# Patient Record
Sex: Male | Born: 1940 | Race: Black or African American | Hispanic: No | Smoking: Never smoker
Health system: Southern US, Community
[De-identification: ages and names within clinical notes are randomized; demographics above are authoritative.]

## PROBLEM LIST (undated history)

## (undated) DIAGNOSIS — E119 Type 2 diabetes mellitus without complications: Secondary | ICD-10-CM

## (undated) DIAGNOSIS — E785 Hyperlipidemia, unspecified: Secondary | ICD-10-CM

## (undated) DIAGNOSIS — E039 Hypothyroidism, unspecified: Secondary | ICD-10-CM

## (undated) DIAGNOSIS — H269 Unspecified cataract: Secondary | ICD-10-CM

## (undated) DIAGNOSIS — K219 Gastro-esophageal reflux disease without esophagitis: Secondary | ICD-10-CM

## (undated) DIAGNOSIS — D126 Benign neoplasm of colon, unspecified: Secondary | ICD-10-CM

## (undated) DIAGNOSIS — E291 Testicular hypofunction: Secondary | ICD-10-CM

## (undated) DIAGNOSIS — N400 Enlarged prostate without lower urinary tract symptoms: Secondary | ICD-10-CM

## (undated) DIAGNOSIS — K602 Anal fissure, unspecified: Secondary | ICD-10-CM

## (undated) DIAGNOSIS — I639 Cerebral infarction, unspecified: Secondary | ICD-10-CM

## (undated) DIAGNOSIS — I10 Essential (primary) hypertension: Secondary | ICD-10-CM

## (undated) DIAGNOSIS — T7840XA Allergy, unspecified, initial encounter: Secondary | ICD-10-CM

## (undated) DIAGNOSIS — M1990 Unspecified osteoarthritis, unspecified site: Secondary | ICD-10-CM

## (undated) DIAGNOSIS — F419 Anxiety disorder, unspecified: Secondary | ICD-10-CM

## (undated) HISTORY — DX: Allergy, unspecified, initial encounter: T78.40XA

## (undated) HISTORY — DX: Gastro-esophageal reflux disease without esophagitis: K21.9

## (undated) HISTORY — DX: Unspecified osteoarthritis, unspecified site: M19.90

## (undated) HISTORY — DX: Type 2 diabetes mellitus without complications: E11.9

## (undated) HISTORY — DX: Cerebral infarction, unspecified: I63.9

## (undated) HISTORY — DX: Benign prostatic hyperplasia without lower urinary tract symptoms: N40.0

## (undated) HISTORY — DX: Hypothyroidism, unspecified: E03.9

## (undated) HISTORY — DX: Testicular hypofunction: E29.1

## (undated) HISTORY — DX: Essential (primary) hypertension: I10

## (undated) HISTORY — DX: Benign neoplasm of colon, unspecified: D12.6

## (undated) HISTORY — DX: Anal fissure, unspecified: K60.2

## (undated) HISTORY — PX: INGUINAL HERNIA REPAIR: SUR1180

## (undated) HISTORY — DX: Hyperlipidemia, unspecified: E78.5

## (undated) HISTORY — PX: COLONOSCOPY: SHX174

---
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
## 2000-08-01 ENCOUNTER — Ambulatory Visit (HOSPITAL_COMMUNITY): Admission: RE | Admit: 2000-08-01 | Discharge: 2000-08-01 | Payer: Self-pay | Admitting: Internal Medicine

## 2000-08-01 ENCOUNTER — Encounter: Payer: Self-pay | Admitting: Internal Medicine

## 2002-02-05 ENCOUNTER — Ambulatory Visit (HOSPITAL_COMMUNITY): Admission: RE | Admit: 2002-02-05 | Discharge: 2002-02-05 | Payer: Self-pay | Admitting: Internal Medicine

## 2002-02-12 ENCOUNTER — Inpatient Hospital Stay (HOSPITAL_COMMUNITY): Admission: AD | Admit: 2002-02-12 | Discharge: 2002-02-19 | Payer: Self-pay | Admitting: Specialist

## 2002-02-13 ENCOUNTER — Encounter: Payer: Self-pay | Admitting: Specialist

## 2002-06-18 ENCOUNTER — Ambulatory Visit (HOSPITAL_COMMUNITY): Admission: RE | Admit: 2002-06-18 | Discharge: 2002-06-18 | Payer: Self-pay | Admitting: Gastroenterology

## 2002-06-25 ENCOUNTER — Encounter: Admission: RE | Admit: 2002-06-25 | Discharge: 2002-06-25 | Payer: Self-pay | Admitting: Oncology

## 2002-06-25 ENCOUNTER — Encounter: Payer: Self-pay | Admitting: Oncology

## 2003-02-14 ENCOUNTER — Encounter (INDEPENDENT_AMBULATORY_CARE_PROVIDER_SITE_OTHER): Payer: Self-pay | Admitting: Specialist

## 2003-02-14 ENCOUNTER — Other Ambulatory Visit: Admission: RE | Admit: 2003-02-14 | Discharge: 2003-02-14 | Payer: Self-pay | Admitting: Oncology

## 2004-09-29 ENCOUNTER — Ambulatory Visit: Payer: Self-pay | Admitting: Oncology

## 2005-01-05 ENCOUNTER — Ambulatory Visit: Payer: Self-pay | Admitting: Oncology

## 2005-07-08 ENCOUNTER — Ambulatory Visit: Payer: Self-pay | Admitting: Oncology

## 2006-01-04 ENCOUNTER — Ambulatory Visit: Payer: Self-pay | Admitting: Oncology

## 2006-07-19 ENCOUNTER — Ambulatory Visit: Payer: Self-pay | Admitting: Oncology

## 2006-07-20 LAB — CBC WITH DIFFERENTIAL/PLATELET
Eosinophils Absolute: 0.1 10*3/uL (ref 0.0–0.5)
LYMPH%: 39.6 % (ref 14.0–48.0)
MCV: 89.1 fL (ref 81.6–98.0)
MONO%: 10.6 % (ref 0.0–13.0)
NEUT#: 2.9 10*3/uL (ref 1.5–6.5)
NEUT%: 47.7 % (ref 40.0–75.0)
Platelets: 186 10*3/uL (ref 145–400)
RBC: 4.44 10*6/uL (ref 4.20–5.71)

## 2006-07-25 LAB — SPEP & IFE WITH QIG
Alpha-1-Globulin: 4.6 % (ref 2.9–4.9)
Gamma Globulin: 8.3 % — ABNORMAL LOW (ref 11.1–18.8)
IgM, Serum: 13 mg/dL — ABNORMAL LOW (ref 60–263)

## 2006-07-25 LAB — COMPREHENSIVE METABOLIC PANEL
Alkaline Phosphatase: 55 U/L (ref 39–117)
BUN: 19 mg/dL (ref 6–23)
Creatinine, Ser: 1.25 mg/dL (ref 0.40–1.50)
Glucose, Bld: 105 mg/dL — ABNORMAL HIGH (ref 70–99)
Sodium: 143 mEq/L (ref 135–145)
Total Bilirubin: 0.7 mg/dL (ref 0.3–1.2)
Total Protein: 6.1 g/dL (ref 6.0–8.3)

## 2006-07-25 LAB — BETA 2 MICROGLOBULIN, SERUM: Beta-2 Microglobulin: 1.45 mg/L (ref 1.01–1.73)

## 2006-07-25 LAB — KAPPA/LAMBDA LIGHT CHAINS: Kappa free light chain: 0.61 mg/dL (ref 0.33–1.94)

## 2007-01-15 ENCOUNTER — Ambulatory Visit: Payer: Self-pay | Admitting: Oncology

## 2007-01-18 LAB — CBC WITH DIFFERENTIAL/PLATELET
Basophils Absolute: 0 10*3/uL (ref 0.0–0.1)
MCH: 29.8 pg (ref 28.0–33.4)
MCHC: 34.4 g/dL (ref 32.0–35.9)
MCV: 86.7 fL (ref 81.6–98.0)
NEUT%: 31.7 % — ABNORMAL LOW (ref 40.0–75.0)
Platelets: 153 10*3/uL (ref 145–400)
RBC: 4.64 10*6/uL (ref 4.20–5.71)
WBC: 3.2 10*3/uL — ABNORMAL LOW (ref 4.0–10.0)

## 2007-01-22 LAB — COMPREHENSIVE METABOLIC PANEL
Albumin: 3.7 g/dL (ref 3.5–5.2)
Alkaline Phosphatase: 56 U/L (ref 39–117)
BUN: 22 mg/dL (ref 6–23)
CO2: 28 mEq/L (ref 19–32)
Calcium: 8.5 mg/dL (ref 8.4–10.5)
Chloride: 100 mEq/L (ref 96–112)
Glucose, Bld: 243 mg/dL — ABNORMAL HIGH (ref 70–99)
Potassium: 4.4 mEq/L (ref 3.5–5.3)
Sodium: 137 mEq/L (ref 135–145)
Total Protein: 5.8 g/dL — ABNORMAL LOW (ref 6.0–8.3)

## 2007-01-22 LAB — SPEP & IFE WITH QIG
Albumin ELP: 58.7 % (ref 55.8–66.1)
Alpha-1-Globulin: 5.4 % — ABNORMAL HIGH (ref 2.9–4.9)
IgA: 320 mg/dL (ref 68–378)
IgM, Serum: 15 mg/dL — ABNORMAL LOW (ref 60–263)
Total Protein, Serum Electrophoresis: 5.8 g/dL — ABNORMAL LOW (ref 6.0–8.3)

## 2007-01-22 LAB — KAPPA/LAMBDA LIGHT CHAINS
Kappa free light chain: 1.61 mg/dL (ref 0.33–1.94)
Kappa:Lambda Ratio: 0.81 (ref 0.26–1.65)
Lambda Free Lght Chn: 2 mg/dL (ref 0.57–2.63)

## 2007-01-22 LAB — LACTATE DEHYDROGENASE: LDH: 134 U/L (ref 94–250)

## 2007-03-05 ENCOUNTER — Ambulatory Visit: Payer: Self-pay | Admitting: Oncology

## 2007-03-08 LAB — CBC WITH DIFFERENTIAL/PLATELET
BASO%: 0.6 % (ref 0.0–2.0)
Basophils Absolute: 0 10*3/uL (ref 0.0–0.1)
EOS%: 2.1 % (ref 0.0–7.0)
HGB: 12.8 g/dL — ABNORMAL LOW (ref 13.0–17.1)
MCH: 30.8 pg (ref 28.0–33.4)
MCHC: 35.5 g/dL (ref 32.0–35.9)
MCV: 86.7 fL (ref 81.6–98.0)
MONO%: 11.9 % (ref 0.0–13.0)
RBC: 4.17 10*6/uL — ABNORMAL LOW (ref 4.20–5.71)
RDW: 14.1 % (ref 11.2–14.6)
lymph#: 1.9 10*3/uL (ref 0.9–3.3)

## 2007-05-22 ENCOUNTER — Ambulatory Visit: Payer: Self-pay | Admitting: Gastroenterology

## 2007-06-05 ENCOUNTER — Encounter: Payer: Self-pay | Admitting: Gastroenterology

## 2007-06-05 ENCOUNTER — Ambulatory Visit: Payer: Self-pay | Admitting: Gastroenterology

## 2007-07-31 ENCOUNTER — Ambulatory Visit: Payer: Self-pay | Admitting: Oncology

## 2007-08-02 LAB — CBC WITH DIFFERENTIAL/PLATELET
EOS%: 3.2 % (ref 0.0–7.0)
Eosinophils Absolute: 0.2 10*3/uL (ref 0.0–0.5)
MCH: 30.7 pg (ref 28.0–33.4)
MCV: 87.3 fL (ref 81.6–98.0)
MONO%: 11 % (ref 0.0–13.0)
NEUT#: 2.5 10*3/uL (ref 1.5–6.5)
RBC: 4.28 10*6/uL (ref 4.20–5.71)
RDW: 14.3 % (ref 11.2–14.6)
lymph#: 2 10*3/uL (ref 0.9–3.3)

## 2007-08-06 LAB — SPEP & IFE WITH QIG
IgA: 298 mg/dL (ref 68–378)
IgG (Immunoglobin G), Serum: 439 mg/dL — ABNORMAL LOW (ref 694–1618)
IgM, Serum: 15 mg/dL — ABNORMAL LOW (ref 60–263)
Total Protein, Serum Electrophoresis: 5.8 g/dL — ABNORMAL LOW (ref 6.0–8.3)

## 2007-08-06 LAB — COMPREHENSIVE METABOLIC PANEL
CO2: 24 mEq/L (ref 19–32)
Creatinine, Ser: 1.24 mg/dL (ref 0.40–1.50)
Glucose, Bld: 146 mg/dL — ABNORMAL HIGH (ref 70–99)
Total Bilirubin: 0.6 mg/dL (ref 0.3–1.2)

## 2007-08-06 LAB — BETA 2 MICROGLOBULIN, SERUM: Beta-2 Microglobulin: 2 mg/L — ABNORMAL HIGH (ref 1.01–1.73)

## 2008-03-19 ENCOUNTER — Ambulatory Visit: Payer: Self-pay | Admitting: Oncology

## 2008-03-21 LAB — CBC & DIFF AND RETIC
Basophils Absolute: 0.1 10*3/uL (ref 0.0–0.1)
EOS%: 2.4 % (ref 0.0–7.0)
Eosinophils Absolute: 0.1 10*3/uL (ref 0.0–0.5)
HGB: 13.6 g/dL (ref 13.0–17.1)
LYMPH%: 40.8 % (ref 14.0–48.0)
MCH: 30.1 pg (ref 28.0–33.4)
MCV: 86.8 fL (ref 81.6–98.0)
MONO%: 9.3 % (ref 0.0–13.0)
NEUT#: 2.1 10*3/uL (ref 1.5–6.5)
NEUT%: 46 % (ref 40.0–75.0)
Platelets: 184 10*3/uL (ref 145–400)

## 2008-03-26 LAB — SPEP & IFE WITH QIG
Alpha-2-Globulin: 9.6 % (ref 7.1–11.8)
Gamma Globulin: 8.3 % — ABNORMAL LOW (ref 11.1–18.8)
IgG (Immunoglobin G), Serum: 565 mg/dL — ABNORMAL LOW (ref 694–1618)
IgM, Serum: 15 mg/dL — ABNORMAL LOW (ref 60–263)

## 2008-03-26 LAB — COMPREHENSIVE METABOLIC PANEL
AST: 19 U/L (ref 0–37)
Albumin: 3.9 g/dL (ref 3.5–5.2)
BUN: 19 mg/dL (ref 6–23)
CO2: 25 mEq/L (ref 19–32)
Calcium: 9.1 mg/dL (ref 8.4–10.5)
Chloride: 104 mEq/L (ref 96–112)
Glucose, Bld: 124 mg/dL — ABNORMAL HIGH (ref 70–99)
Potassium: 4.1 mEq/L (ref 3.5–5.3)

## 2008-03-26 LAB — KAPPA/LAMBDA LIGHT CHAINS: Kappa free light chain: 0.55 mg/dL (ref 0.33–1.94)

## 2008-03-26 LAB — LACTATE DEHYDROGENASE: LDH: 135 U/L (ref 94–250)

## 2008-10-01 ENCOUNTER — Ambulatory Visit: Payer: Self-pay | Admitting: Oncology

## 2008-10-03 LAB — CBC & DIFF AND RETIC
Basophils Absolute: 0 10*3/uL (ref 0.0–0.1)
EOS%: 2 % (ref 0.0–7.0)
Eosinophils Absolute: 0.1 10*3/uL (ref 0.0–0.5)
HCT: 38 % — ABNORMAL LOW (ref 38.7–49.9)
HGB: 12.9 g/dL — ABNORMAL LOW (ref 13.0–17.1)
MCH: 30 pg (ref 28.0–33.4)
MCV: 88.4 fL (ref 81.6–98.0)
MONO%: 11.8 % (ref 0.0–13.0)
NEUT#: 2.5 10*3/uL (ref 1.5–6.5)
NEUT%: 49.3 % (ref 40.0–75.0)
Platelets: 183 10*3/uL (ref 145–400)
RETIC #: 64.9 10*3/uL (ref 31.8–103.9)

## 2008-10-07 LAB — SPEP & IFE WITH QIG
Albumin ELP: 62.5 % (ref 55.8–66.1)
Alpha-2-Globulin: 9.9 % (ref 7.1–11.8)
Beta 2: 7.1 % — ABNORMAL HIGH (ref 3.2–6.5)
Beta Globulin: 7.5 % — ABNORMAL HIGH (ref 4.7–7.2)
IgA: 347 mg/dL (ref 68–378)
IgG (Immunoglobin G), Serum: 522 mg/dL — ABNORMAL LOW (ref 694–1618)
Total Protein, Serum Electrophoresis: 5.9 g/dL — ABNORMAL LOW (ref 6.0–8.3)

## 2008-10-07 LAB — COMPREHENSIVE METABOLIC PANEL
ALT: 18 U/L (ref 0–53)
AST: 18 U/L (ref 0–37)
Albumin: 4.1 g/dL (ref 3.5–5.2)
BUN: 19 mg/dL (ref 6–23)
CO2: 22 mEq/L (ref 19–32)
Calcium: 9.4 mg/dL (ref 8.4–10.5)
Chloride: 104 mEq/L (ref 96–112)
Creatinine, Ser: 1.29 mg/dL (ref 0.40–1.50)
Potassium: 4 mEq/L (ref 3.5–5.3)

## 2008-10-07 LAB — LACTATE DEHYDROGENASE: LDH: 135 U/L (ref 94–250)

## 2009-03-23 ENCOUNTER — Ambulatory Visit: Payer: Self-pay | Admitting: Oncology

## 2009-03-25 LAB — CBC WITH DIFFERENTIAL/PLATELET
Basophils Absolute: 0 10*3/uL (ref 0.0–0.1)
EOS%: 1.8 % (ref 0.0–7.0)
Eosinophils Absolute: 0.1 10*3/uL (ref 0.0–0.5)
HGB: 12.7 g/dL — ABNORMAL LOW (ref 13.0–17.1)
MCH: 29.6 pg (ref 27.2–33.4)
NEUT#: 2.2 10*3/uL (ref 1.5–6.5)
RBC: 4.29 10*6/uL (ref 4.20–5.82)
RDW: 13.2 % (ref 11.0–14.6)
WBC: 5 10*3/uL (ref 4.0–10.3)
lymph#: 2.2 10*3/uL (ref 0.9–3.3)

## 2009-03-27 LAB — IMMUNOFIXATION ELECTROPHORESIS: IgG (Immunoglobin G), Serum: 531 mg/dL — ABNORMAL LOW (ref 694–1618)

## 2009-03-27 LAB — COMPREHENSIVE METABOLIC PANEL
AST: 19 U/L (ref 0–37)
Albumin: 3.9 g/dL (ref 3.5–5.2)
BUN: 21 mg/dL (ref 6–23)
Calcium: 8.9 mg/dL (ref 8.4–10.5)
Chloride: 104 mEq/L (ref 96–112)
Potassium: 4.2 mEq/L (ref 3.5–5.3)
Sodium: 139 mEq/L (ref 135–145)
Total Protein: 5.9 g/dL — ABNORMAL LOW (ref 6.0–8.3)

## 2009-03-27 LAB — KAPPA/LAMBDA LIGHT CHAINS
Kappa free light chain: <0.6 mg/dL (ref 0.33–1.94)
Lambda Free Lght Chn: 1.1 mg/dL (ref 0.57–2.63)

## 2009-03-27 LAB — IRON AND TIBC
%SAT: 25 % (ref 20–55)
Iron: 79 ug/dL (ref 42–165)

## 2009-03-27 LAB — BETA 2 MICROGLOBULIN, SERUM: Beta-2 Microglobulin: 1.9 mg/L — ABNORMAL HIGH (ref 1.01–1.73)

## 2009-10-23 ENCOUNTER — Ambulatory Visit: Payer: Self-pay | Admitting: Oncology

## 2009-10-27 LAB — CBC & DIFF AND RETIC
BASO%: 0.2 % (ref 0.0–2.0)
Eosinophils Absolute: 0.1 10*3/uL (ref 0.0–0.5)
LYMPH%: 48 % (ref 14.0–49.0)
MCHC: 34.4 g/dL (ref 32.0–36.0)
MCV: 86.2 fL (ref 79.3–98.0)
MONO#: 0.4 10*3/uL (ref 0.1–0.9)
MONO%: 9.2 % (ref 0.0–14.0)
NEUT#: 1.9 10*3/uL (ref 1.5–6.5)
Platelets: 157 10*3/uL (ref 140–400)
RBC: 4.42 10*6/uL (ref 4.20–5.82)
RDW: 13.2 % (ref 11.0–14.6)
Retic %: 1.21 % (ref 0.50–1.60)
Retic Ct Abs: 53.48 10*3/uL (ref 24.10–77.50)
WBC: 4.7 10*3/uL (ref 4.0–10.3)

## 2009-10-29 LAB — SPEP & IFE WITH QIG
Alpha-1-Globulin: 4.3 % (ref 2.9–4.9)
Alpha-2-Globulin: 9.7 % (ref 7.1–11.8)
Beta 2: 7.5 % — ABNORMAL HIGH (ref 3.2–6.5)
Beta Globulin: 7.6 % — ABNORMAL HIGH (ref 4.7–7.2)
Gamma Globulin: 8.8 % — ABNORMAL LOW (ref 11.1–18.8)
IgG (Immunoglobin G), Serum: 487 mg/dL — ABNORMAL LOW (ref 694–1618)

## 2009-10-29 LAB — COMPREHENSIVE METABOLIC PANEL
AST: 19 U/L (ref 0–37)
Alkaline Phosphatase: 60 U/L (ref 39–117)
BUN: 17 mg/dL (ref 6–23)
Glucose, Bld: 178 mg/dL — ABNORMAL HIGH (ref 70–99)
Total Bilirubin: 0.3 mg/dL (ref 0.3–1.2)

## 2009-10-29 LAB — IRON AND TIBC
%SAT: 24 % (ref 20–55)
Iron: 73 ug/dL (ref 42–165)
TIBC: 307 ug/dL (ref 215–435)
UIBC: 234 ug/dL

## 2009-10-29 LAB — FERRITIN: Ferritin: 44 ng/mL (ref 22–322)

## 2009-12-31 ENCOUNTER — Encounter: Admission: RE | Admit: 2009-12-31 | Discharge: 2009-12-31 | Payer: Self-pay | Admitting: General Surgery

## 2010-01-04 ENCOUNTER — Ambulatory Visit (HOSPITAL_BASED_OUTPATIENT_CLINIC_OR_DEPARTMENT_OTHER): Admission: RE | Admit: 2010-01-04 | Discharge: 2010-01-04 | Payer: Self-pay | Admitting: General Surgery

## 2010-10-27 ENCOUNTER — Ambulatory Visit: Payer: Self-pay | Admitting: Oncology

## 2010-10-28 LAB — CBC WITH DIFFERENTIAL/PLATELET
BASO%: 0.5 % (ref 0.0–2.0)
Basophils Absolute: 0 10*3/uL (ref 0.0–0.1)
EOS%: 1.6 % (ref 0.0–7.0)
Eosinophils Absolute: 0.1 10*3/uL (ref 0.0–0.5)
HCT: 38.9 % (ref 38.4–49.9)
HGB: 13.4 g/dL (ref 13.0–17.1)
LYMPH%: 40.2 % (ref 14.0–49.0)
MCH: 30.5 pg (ref 27.2–33.4)
MCHC: 34.5 g/dL (ref 32.0–36.0)
MCV: 88.3 fL (ref 79.3–98.0)
MONO#: 0.5 10*3/uL (ref 0.1–0.9)
MONO%: 11.4 % (ref 0.0–14.0)
NEUT#: 2.2 10*3/uL (ref 1.5–6.5)
NEUT%: 46.3 % (ref 39.0–75.0)
Platelets: 185 10*3/uL (ref 140–400)
RBC: 4.4 10*6/uL (ref 4.20–5.82)
RDW: 13.2 % (ref 11.0–14.6)
WBC: 4.8 10*3/uL (ref 4.0–10.3)
lymph#: 1.9 10*3/uL (ref 0.9–3.3)

## 2010-11-01 LAB — SPEP & IFE WITH QIG
Alpha-2-Globulin: 10.9 % (ref 7.1–11.8)
Gamma Globulin: 9 % — ABNORMAL LOW (ref 11.1–18.8)
IgG (Immunoglobin G), Serum: 614 mg/dL — ABNORMAL LOW (ref 694–1618)

## 2010-11-01 LAB — IRON AND TIBC
%SAT: 39 % (ref 20–55)
Iron: 121 ug/dL (ref 42–165)

## 2010-11-01 LAB — COMPREHENSIVE METABOLIC PANEL
ALT: 12 U/L (ref 0–53)
AST: 17 U/L (ref 0–37)
Albumin: 4 g/dL (ref 3.5–5.2)
Alkaline Phosphatase: 69 U/L (ref 39–117)
BUN: 18 mg/dL (ref 6–23)
CO2: 28 mEq/L (ref 19–32)
Calcium: 9.4 mg/dL (ref 8.4–10.5)
Chloride: 102 mEq/L (ref 96–112)
Creatinine, Ser: 1.13 mg/dL (ref 0.40–1.50)
Glucose, Bld: 138 mg/dL — ABNORMAL HIGH (ref 70–99)
Potassium: 4.2 mEq/L (ref 3.5–5.3)
Sodium: 140 mEq/L (ref 135–145)
Total Bilirubin: 0.5 mg/dL (ref 0.3–1.2)
Total Protein: 6 g/dL (ref 6.0–8.3)

## 2010-11-01 LAB — KAPPA/LAMBDA LIGHT CHAINS
Kappa free light chain: <0.66 mg/dL (ref 0.33–1.94)
Lambda Free Lght Chn: 0.83 mg/dL (ref 0.57–2.63)

## 2010-11-01 LAB — LACTATE DEHYDROGENASE: LDH: 109 U/L (ref 94–250)

## 2011-02-09 LAB — CBC
MCHC: 33.9 g/dL (ref 30.0–36.0)
Platelets: 180 10*3/uL (ref 150–400)
RBC: 4.38 MIL/uL (ref 4.22–5.81)
RDW: 13.2 % (ref 11.5–15.5)

## 2011-02-09 LAB — DIFFERENTIAL
Basophils Absolute: 0 10*3/uL (ref 0.0–0.1)
Basophils Relative: 1 % (ref 0–1)
Neutro Abs: 2.5 10*3/uL (ref 1.7–7.7)
Neutrophils Relative %: 45 % (ref 43–77)

## 2011-02-09 LAB — BASIC METABOLIC PANEL
CO2: 28 mEq/L (ref 19–32)
Calcium: 9.4 mg/dL (ref 8.4–10.5)
Creatinine, Ser: 1.36 mg/dL (ref 0.4–1.5)
GFR calc Af Amer: >60 mL/min (ref >60)
Glucose, Bld: 108 mg/dL — ABNORMAL HIGH (ref 70–99)

## 2011-02-09 LAB — GLUCOSE, CAPILLARY
Glucose-Capillary: 161 mg/dL — ABNORMAL HIGH (ref 70–99)
Glucose-Capillary: 181 mg/dL — ABNORMAL HIGH (ref 70–99)

## 2011-04-08 NOTE — H&P (Signed)
[facility]  Patient:    Earl [name], Earl [name] Visit Number: [id number] MRN: [id number]          Service Type: Attending:  Philips J. Montez Morita, M.D. Dictated by:   Sammuel Cooper. Mahar, P.A. Adm. Date:  02/12/02                           History and Physical  CHIEF COMPLAINT:  Left knee pain and swelling.  HISTORY OF PRESENT ILLNESS:  For approximately three weeks the patient has been having left knee pain and swelling.  He reports that he had a sinus infection at the same time as the knee swelling and pain began.  It had been getting progressively worse over three weeks.  He has been seen by his primary care physician for this and treated with antibiotics.  Over the last two to three days it has been getting slightly better.  The pain has decreased, as well as the swelling.  ALLERGIES:  CIPRO causes a rash.  MEDICATIONS:  Toprol, hydrochlorothiazide, Accupril, and glyburide.  Doses and frequency are unknown.  The patient will bring these with him to the hospital for clarification.  PAST MEDICAL HISTORY: 1. Diabetes mellitus type 2. 2. Hypertension.  PAST SURGICAL HISTORY:  Unknown.  SOCIAL HISTORY:  The patient denies tobacco use.  Denies alcohol use.  He is married, lives with his wife.  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  FAMILY HISTORY:  Significant for diabetes mellitus and sickle cell anemia.  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  REVIEW OF SYSTEMS:  The patient denies any fevers, chills, night sweats, or bleeding tendencies.  CNS:  Denies any blurred vision, double vision, headaches, seizure, or paralysis.  CARDIOVASCULAR:  Denies any chest pain, angina, orthopnea, claudication, or palpitations.  PULMONARY:  Denies any shortness of breath, productive cough, or hemoptysis.  GASTROINTESTINAL: Denies any nausea, vomiting, constipation, diarrhea, melena, or bloody stool. GENITOURINARY:  Denies any dysuria, hematuria, or  discharge.  PHYSICAL EXAMINATION:  GENERAL:  The patient is a 70 year old male.  He is alert and oriented.  In no acute distress.  Well-nourished, well-groomed.  Appears stated age.  HEENT:  Head normocephalic, atraumatic.  Extraocular movements intact.  Nares patent bilaterally.  Pharynx is clear.  NECK:  No bruits.  No lymphadenopathy.  No thyromegaly noted.  CHEST:  Clear to auscultation bilaterally.  BREASTS:  Not pertinent, not performed.  HEART:  Regular rate and rhythm.  No murmurs, gallops, or rubs.  ABDOMEN:  Soft and supple.  Nontender, nondistended.  Positive bowel sounds throughout.  No organomegaly noted.  GENITOURINARY:  Not pertinent, not performed.  EXTREMITIES:  Left knee and left ankle with swelling.  No discolorations present.  Painful knee range of motion.  Sensation and motor function intact distally.  Dorsalis pedis pulses and posterior tibialis pulses are intact although difficult to palpate secondary to swelling about the ankle and foot.  LABORATORY DATA:  X-ray with no acute changes.  IMPRESSION:  Question septic knee on the left side.  PLAN:  Admit to [facility] for IV antibiotics and observation to Dr. Ronnell Guadalajara. Dictated by:   Sammuel Cooper. Mahar, P.A. Attending:  Philips J. Montez Morita, M.D. DD:  02/13/02 TD:  02/14/02 Job: [id number]

## 2011-04-08 NOTE — Op Note (Signed)
[facility]  Patient:    Earl [name], Earl [name] Visit Number: [id number] MRN: [id number]          Service Type: SUR Location: 4W 0480 01 Attending Physician:  Rocky Crafts Dictated by:   Michael Litter. Montez Morita, M.D. Proc. Date: 02/14/02 Admit Date:  02/12/2002                             Operative Report  PREOPERATIVE DIAGNOSIS:  Partially treated septic knee, left knee.  POSTOPERATIVE DIAGNOSIS:  Partially treated septic knee, left knee.  OPERATION PERFORMED:  Debridement and irrigation of the left knee with partial synovial removal.  DESCRIPTION OF PROCEDURE:  After suitable general anesthesia, the leg was exsanguinated and upper thigh tourniquet inflated to 350 mmHg. He was then placed in a leg holder and prepped and draped routinely. A large cannula was placed through the lateral parapatellar portal and expressed a goodly amount of serous fluid with large clumps of material in it. This was sent for Gram stain culture and sensitivity, aerobic, anaerobic and fungal cultures. A previous culture done from my office two days ago on aspirate in the office showed a few white cells on the Gram stain and was not growing anything at this point. He had been partially treated with antibiotics over the last month, pain had increased dramatically in the last two weeks. He had been on prednisone as well. He is a diabetic.  Following this, the arthroscope was introduced through an anterolateral portal. The fluid was then brought in through the arthroscope, a lateral parapatellar cannula was removed and a 4.2 Gator rotary meniscotome nonaggressive was brought in to remove the clumps and debris from the suprapatellar pouch and some of the thickened pockets of synovium. The scope was then brought into the medial joint where again a lot of that same material and a medial puncture wound brought in the scope and this was cleaned out then. Severe degenerative  changes over the medial femoral condyle and over the medial plateau were noted. I came over to the lateral joint after removing part of the ligament of mucosum. There was some thick angry material around the ACL. ______ joint did not have the same degree of destruction that I think we saw on the medial side and the patella. The back of the knee was pumped for irrigation to bring the fluid up and washed out as we went along. As fluid was brought in through the scope after the debridement was carried out, outflow was then just done manually through the large outflow cannula and the lateral parapatellar area that was reinserted. At the end of the procedure, the lateral parapatellar and the anteromedial portals were sutured. 20 cc of 0.5% plain Marcaine were put into the knee through the scope. The scope was removed and the last portal was sutured. A compression dressing was applied. He had been on prednisone up to this time, so a 100 mg of Solu-Cortef was given by anesthesia IV. He had been afebrile the last 24 hours on IV vancomycin. He went to recovery in good condition. Dictated by:   [employer] Montez Morita, M.D. Attending Physician:  Rocky Crafts DD:  02/14/02 TD:  02/15/02 Job: [id number]

## 2011-04-08 NOTE — Discharge Summary (Signed)
[facility]  Patient:    Earl [name], Earl [name] Visit Number: [id number] MRN: [id number]          Service Type: SUR Attending Physician:  Rocky Crafts Dictated by:   Judeth Porch. Perkins, P.A.-C. Admit Date:  02/12/2002 Discharge Date: 02/19/2002   CC:         Dewayne Shorter, M.D.   Discharge Summary  NO DICTATION Dictated by:   Alexzandrew L. Perkins, P.A.-C. Attending Physician:  Rocky Crafts DD:  03/14/02 TD:  03/15/02 Job: [phone]

## 2011-10-28 ENCOUNTER — Other Ambulatory Visit: Payer: Self-pay | Admitting: Oncology

## 2011-10-28 ENCOUNTER — Other Ambulatory Visit (HOSPITAL_BASED_OUTPATIENT_CLINIC_OR_DEPARTMENT_OTHER): Payer: Medicare Other | Admitting: Lab

## 2011-10-28 DIAGNOSIS — D472 Monoclonal gammopathy: Secondary | ICD-10-CM

## 2011-10-28 LAB — CBC WITH DIFFERENTIAL/PLATELET
BASO%: 0.6 % (ref 0.0–2.0)
LYMPH%: 41.7 % (ref 14.0–49.0)
MCHC: 33.6 g/dL (ref 32.0–36.0)
MONO#: 0.5 10*3/uL (ref 0.1–0.9)
Platelets: 173 10*3/uL (ref 140–400)
RBC: 4.45 10*6/uL (ref 4.20–5.82)
RDW: 13.8 % (ref 11.0–14.6)
WBC: 4.7 10*3/uL (ref 4.0–10.3)
lymph#: 2 10*3/uL (ref 0.9–3.3)

## 2011-11-01 LAB — COMPREHENSIVE METABOLIC PANEL
ALT: 15 U/L (ref 0–53)
AST: 16 U/L (ref 0–37)
Albumin: 3.9 g/dL (ref 3.5–5.2)
Alkaline Phosphatase: 60 U/L (ref 39–117)
BUN: 17 mg/dL (ref 6–23)
CO2: 29 mEq/L (ref 19–32)
Calcium: 9.4 mg/dL (ref 8.4–10.5)
Chloride: 103 mEq/L (ref 96–112)
Creatinine, Ser: 1.2 mg/dL (ref 0.50–1.35)
Glucose, Bld: 149 mg/dL — ABNORMAL HIGH (ref 70–99)
Potassium: 4.3 mEq/L (ref 3.5–5.3)
Sodium: 140 mEq/L (ref 135–145)
Total Bilirubin: 0.6 mg/dL (ref 0.3–1.2)
Total Protein: 5.7 g/dL — ABNORMAL LOW (ref 6.0–8.3)

## 2011-11-01 LAB — IMMUNOFIXATION ELECTROPHORESIS
IgA: 307 mg/dL (ref 68–379)
IgG (Immunoglobin G), Serum: 546 mg/dL — ABNORMAL LOW (ref 650–1600)
IgM, Serum: 17 mg/dL — ABNORMAL LOW (ref 41–251)
Total Protein, Serum Electrophoresis: 5.7 g/dL — ABNORMAL LOW (ref 6.0–8.3)

## 2011-11-01 LAB — KAPPA/LAMBDA LIGHT CHAINS: Kappa:Lambda Ratio: 0.71 (ref 0.26–1.65)

## 2011-11-01 LAB — IRON AND TIBC
%SAT: 31 % (ref 20–55)
TIBC: 308 ug/dL (ref 215–435)

## 2011-11-01 LAB — BETA 2 MICROGLOBULIN, SERUM: Beta-2 Microglobulin: 1.9 mg/L — ABNORMAL HIGH (ref 1.01–1.73)

## 2011-11-01 LAB — FERRITIN: Ferritin: 44 ng/mL (ref 22–322)

## 2011-11-03 ENCOUNTER — Other Ambulatory Visit: Payer: Self-pay | Admitting: Oncology

## 2011-11-03 ENCOUNTER — Telehealth: Payer: Self-pay | Admitting: Oncology

## 2011-11-03 ENCOUNTER — Ambulatory Visit (HOSPITAL_BASED_OUTPATIENT_CLINIC_OR_DEPARTMENT_OTHER): Payer: Medicare Other | Admitting: Oncology

## 2011-11-03 VITALS — BP 146/80 | HR 65 | Temp 97.6°F | Wt 201.3 lb

## 2011-11-03 DIAGNOSIS — D472 Monoclonal gammopathy: Secondary | ICD-10-CM

## 2011-11-03 NOTE — Telephone Encounter (Signed)
Gv pt appt for dec2013 °

## 2011-11-03 NOTE — Progress Notes (Signed)
Hematology and Oncology Follow Up Visit  Earl [name] [id number] 1941-09-17 70 y.o. 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  Medications: I have reviewed the patient's current medications.  Allergies:  Allergies  Allergen Reactions  . Penicillins     Past Medical History, Surgical history, Social history, and Family History were reviewed and updated.  Review of Systems: Constitutional:  Negative for fever, chills, night sweats, anorexia, weight loss, pain. Cardiovascular: no chest pain or dyspnea on exertion Respiratory: no cough, shortness of breath, or wheezing Neurological: no TIA or stroke symptoms Dermatological: negative ENT: negative Skin Gastrointestinal: no abdominal pain, change in bowel habits, or black or bloody stools Genito-Urinary: no dysuria, trouble voiding, or hematuria Hematological and Lymphatic: negative Breast: negative for breast lumps negative Musculoskeletal: negative Remaining ROS negative.  Physical Exam: Blood pressure 146/80, pulse 65, temperature 97.6 F (36.4 C), weight 201 lb 4.8 oz (91.309 kg). ECOG: 0 General appearance: alert, cooperative and appears stated age Head: Normocephalic, without obvious abnormality, atraumatic Neck: no adenopathy, no carotid bruit, no JVD, supple, symmetrical, trachea midline and thyroid not enlarged, symmetric, no tenderness/mass/nodules Lymph nodes: Cervical, supraclavicular, and axillary nodes normal. Cardiac : nl Pulmonary:nl Breasts:n/a Abdomen:nl Extremitiesnl Neuro:nl  Lab Results: Lab Results  Component Value Date   WBC 4.7 10/28/2011   HGB 13.3 10/28/2011   HCT 39.4 10/28/2011   MCV 88.6 10/28/2011   PLT 173 10/28/2011     Chemistry      Component Value Date/Time   NA 140 10/28/2011 0934   NA 140 10/28/2011 0934   NA 140 10/28/2011 0934   NA 140 10/28/2011 0934     NA 140 10/28/2011 0934   NA 140 10/28/2011 0934   K 4.3 10/28/2011 0934   K 4.3 10/28/2011 0934   K 4.3 10/28/2011 0934   K 4.3 10/28/2011 0934   K 4.3 10/28/2011 0934   K 4.3 10/28/2011 0934   CL 103 10/28/2011 0934   CL 103 10/28/2011 0934   CL 103 10/28/2011 0934   CL 103 10/28/2011 0934   CL 103 10/28/2011 0934   CL 103 10/28/2011 0934   CO2 29 10/28/2011 0934   CO2 29 10/28/2011 0934   CO2 29 10/28/2011 0934   CO2 29 10/28/2011 0934   CO2 29 10/28/2011 0934   CO2 29 10/28/2011 0934   BUN 17 10/28/2011 0934   BUN 17 10/28/2011 0934   BUN 17 10/28/2011 0934   BUN 17 10/28/2011 0934   BUN 17 10/28/2011 0934   BUN 17 10/28/2011 0934   CREATININE 1.20 10/28/2011 0934   CREATININE 1.20 10/28/2011 0934   CREATININE 1.20 10/28/2011 0934   CREATININE 1.20 10/28/2011 0934   CREATININE 1.20 10/28/2011 0934   CREATININE 1.20 10/28/2011 0934      Component Value Date/Time   CALCIUM 9.4 10/28/2011 0934   CALCIUM 9.4 10/28/2011 0934   CALCIUM 9.4 10/28/2011 0934   CALCIUM 9.4 10/28/2011 0934   CALCIUM 9.4 10/28/2011 0934   CALCIUM 9.4 10/28/2011 0934   ALKPHOS 60 10/28/2011 0934   ALKPHOS 60 10/28/2011 0934   ALKPHOS 60 10/28/2011 0934   ALKPHOS 60 10/28/2011 0934   ALKPHOS 60 10/28/2011 0934   ALKPHOS 60 10/28/2011 0934   AST 16 10/28/2011 0934   AST 16 10/28/2011 0934   AST 16 10/28/2011 0934   AST 16 10/28/2011 0934   AST 16  10/28/2011 0934   AST 16 10/28/2011 0934   ALT 15 10/28/2011 0934   ALT 15 10/28/2011 0934   ALT 15 10/28/2011 0934   ALT 15 10/28/2011 0934   ALT 15 10/28/2011 0934   ALT 15 10/28/2011 0934   BILITOT 0.6 10/28/2011 0934   BILITOT 0.6 10/28/2011 0934   BILITOT 0.6 10/28/2011 0934   BILITOT 0.6 10/28/2011 0934   BILITOT 0.6 10/28/2011 0934   BILITOT 0.6 10/28/2011 0934       Radiological Studies: chest X-ray n/a Mammogram n/a Bone density n/a  Impression and Plan: Lab work shows stable m-spike, and SFLC. No changes in f/u; i yr.  More than 50% of the visit was spent in  patient-related counselling   [name], MD 12/13/201211:15 AM

## 2012-07-10 ENCOUNTER — Encounter: Payer: Self-pay | Admitting: Gastroenterology

## 2012-07-18 ENCOUNTER — Encounter: Payer: Self-pay | Admitting: Gastroenterology

## 2012-08-24 ENCOUNTER — Ambulatory Visit (AMBULATORY_SURGERY_CENTER): Payer: Medicare Other

## 2012-08-24 VITALS — Ht 71.0 in | Wt 196.0 lb

## 2012-08-24 DIAGNOSIS — Z860100 Personal history of colon polyps, unspecified: Secondary | ICD-10-CM

## 2012-09-07 ENCOUNTER — Ambulatory Visit (AMBULATORY_SURGERY_CENTER): Payer: Medicare Other | Admitting: Gastroenterology

## 2012-09-07 ENCOUNTER — Other Ambulatory Visit: Payer: Self-pay | Admitting: *Deleted

## 2012-09-07 ENCOUNTER — Encounter: Payer: Medicare Other | Admitting: Gastroenterology

## 2012-09-07 ENCOUNTER — Encounter: Payer: Self-pay | Admitting: Gastroenterology

## 2012-09-07 VITALS — BP 120/62 | HR 87 | Temp 97.8°F | Resp 24 | Ht 71.0 in | Wt 196.0 lb

## 2012-09-07 DIAGNOSIS — Z86010 Personal history of colonic polyps: Secondary | ICD-10-CM

## 2012-09-07 DIAGNOSIS — D126 Benign neoplasm of colon, unspecified: Secondary | ICD-10-CM

## 2012-09-07 DIAGNOSIS — Z1211 Encounter for screening for malignant neoplasm of colon: Secondary | ICD-10-CM

## 2012-09-07 DIAGNOSIS — I4891 Unspecified atrial fibrillation: Secondary | ICD-10-CM

## 2012-09-07 MED ORDER — SODIUM CHLORIDE 0.9 % IV SOLN: 500.0000 mL | INTRAVENOUS | Status: DC

## 2012-09-07 NOTE — Patient Instructions (Addendum)
Discharge instructions given with verbal understanding. Handout on polyp given. Resume previous medications. YOU HAD AN ENDOSCOPIC PROCEDURE TODAY AT THE [facility]: Refer to the procedure report that was given to you for any specific questions about what was found during the examination.  If the procedure report does not answer your questions, please call your gastroenterologist to clarify.  If you requested that your care partner not be given the details of your procedure findings, then the procedure report has been included in a sealed envelope for you to review at your convenience later.  YOU SHOULD EXPECT: Some feelings of bloating in the abdomen. Passage of more gas than usual.  Walking can help get rid of the air that was put into your GI tract during the procedure and reduce the bloating. If you had a lower endoscopy (such as a colonoscopy or flexible sigmoidoscopy) you may notice spotting of blood in your stool or on the toilet paper. If you underwent a bowel prep for your procedure, then you may not have a normal bowel movement for a few days.  DIET: Your first meal following the procedure should be a light meal and then it is ok to progress to your normal diet.  A half-sandwich or bowl of soup is an example of a good first meal.  Heavy or fried foods are harder to digest and may make you feel nauseous or bloated.  Likewise meals heavy in dairy and vegetables can cause extra gas to form and this can also increase the bloating.  Drink plenty of fluids but you should avoid alcoholic beverages for 24 hours.  ACTIVITY: Your care partner should take you home directly after the procedure.  You should plan to take it easy, moving slowly for the rest of the day.  You can resume normal activity the day after the procedure however you should NOT DRIVE or use heavy machinery for 24 hours (because of the sedation medicines used during the test).    SYMPTOMS TO REPORT IMMEDIATELY: A  gastroenterologist can be reached at any hour.  During normal business hours, 8:30 AM to 5:00 PM Monday through Friday, call [phone].  After hours and on weekends, please call the GI answering service at [phone] who will take a message and have the physician on call contact you.   Following lower endoscopy (colonoscopy or flexible sigmoidoscopy):  Excessive amounts of blood in the stool  Significant tenderness or worsening of abdominal pains  Swelling of the abdomen that is new, acute  Fever of 100F or higher   FOLLOW UP: If any biopsies were taken you will be contacted by phone or by letter within the next 1-3 weeks.  Call your gastroenterologist if you have not heard about the biopsies in 3 weeks.  Our staff will call the home number listed on your records the next business day following your procedure to check on you and address any questions or concerns that you may have at that time regarding the information given to you following your procedure. This is a courtesy call and so if there is no answer at the home number and we have not heard from you through the emergency physician on call, we will assume that you have returned to your regular daily activities without incident.  SIGNATURES/CONFIDENTIALITY: You and/or your care partner have signed paperwork which will be entered into your electronic medical record.  These signatures attest to the fact that that the information above on your After Visit Summary   has been reviewed and is understood.  Full responsibility of the confidentiality of this discharge information lies with you and/or your care-partner.  

## 2012-09-07 NOTE — Progress Notes (Addendum)
Pt attached to monitor and noted to have HR 100-140's and  in A fibrillation. Pt states he has never had any problems with his heart, does take BP meds but did not take them this am. Very irregular rhythm. MD aware of this with no further orders. ewm  Propofol per L Beeson CRNA . See scanned intra procedure report. ewm  Patient's rhythm appeared consistent with atrial fibrillation. He has no history of arrhythmias and denies chest pain or shortness of breath. Plan to obtain a 12-lead EKG and to refer to cardiology.  EKG shows atrial fibrillation. There are no ischemic changes. Plan cardiology referral.

## 2012-09-07 NOTE — Op Note (Signed)
[facility] [address] [place], [address]   COLONOSCOPY PROCEDURE REPORT  PATIENT: Earl [name], Earl [name]  MR#: [id number] BIRTHDATE: 05-10-1941 , 71  yrs. old GENDER: Male ENDOSCOPIST: Louis Meckel, MD REFERRED WN:[id number] Althea Charon, M.D. PROCEDURE DATE:  09/07/2012 PROCEDURE:   Colonoscopy with snare polypectomy ASA CLASS:   Class II INDICATIONS: MEDICATIONS: MAC sedation, administered by CRNA and propofol (Diprivan) 200mg  IV  DESCRIPTION OF PROCEDURE:   After the risks benefits and alternatives of the procedure were thoroughly explained, informed consent was obtained.  A digital rectal exam revealed no abnormalities of the rectum.   The LB CF-[id number]  endoscope was introduced through the anus and advanced to the cecum, which was identified by both the appendix and ileocecal valve. No adverse events experienced.   The quality of the prep was Suprep excellent The instrument was then slowly withdrawn as the colon was fully examined.      COLON FINDINGS: A flat polyp measuring 3 mm in size was found in the ascending colon.  A polypectomy was performed with a cold snare. The resection was complete and the polyp tissue was completely retrieved.   The colon mucosa was otherwise normal.  Retroflexed views revealed no abnormalities. The time to cecum=3 minutes 25 seconds.  Withdrawal time=7 minutes 57 seconds.  The scope was withdrawn and the procedure completed. COMPLICATIONS: There were no complications.  ENDOSCOPIC IMPRESSION: 1.   Flat polyp measuring 3 mm in size was found in the ascending colon; polypectomy was performed with a cold snare 2.   The colon mucosa was otherwise normal  RECOMMENDATIONS: If the polyp(s) removed today are proven to be adenomatous (pre-cancerous) polyps, you will need a repeat colonoscopy in 5 years.  Otherwise you should continue to follow colorectal cancer screening guidelines for "routine risk" patients with  colonoscopy in 10 years.  You will receive a letter within 1-2 weeks with the results of your biopsy as well as final recommendations.  Please call my office if you have not received a letter after 3 weeks.   eSigned:  Louis Meckel, MD 09/07/2012 11:39 AM   cc:

## 2012-09-07 NOTE — Progress Notes (Signed)
Patient did not experience any of the following events: a burn prior to discharge; a fall within the facility; wrong site/side/patient/procedure/implant event; or a hospital transfer or hospital admission upon discharge from the facility. (G8907) Patient did not have preoperative order for IV antibiotic SSI prophylaxis. (G8918)  

## 2012-09-10 ENCOUNTER — Telehealth: Payer: Self-pay | Admitting: *Deleted

## 2012-09-10 NOTE — Telephone Encounter (Signed)
  Follow up Call-  Call back number 09/07/2012  Post procedure Call Back phone  # [phone]  Permission to leave phone message Yes     Patient questions:  Do you have a fever, pain , or abdominal swelling? no Pain Score  0 *  Have you tolerated food without any problems? yes  Have you been able to return to your normal activities? yes  Do you have any questions about your discharge instructions: Diet   no Medications  no Follow up visit  no  Do you have questions or concerns about your Care? no  Actions: * If pain score is 4 or above: No action needed, pain <4.  "I feel good." patient states, he also states he has appointment with cardiologist that Dr.Kaplan sent him to. He denies any problems at this time.

## 2012-09-12 ENCOUNTER — Encounter: Payer: Self-pay | Admitting: Gastroenterology

## 2012-09-18 ENCOUNTER — Ambulatory Visit (INDEPENDENT_AMBULATORY_CARE_PROVIDER_SITE_OTHER): Payer: Medicare Other | Admitting: Cardiovascular Disease

## 2012-09-18 ENCOUNTER — Encounter: Payer: Self-pay | Admitting: Cardiovascular Disease

## 2012-09-18 VITALS — BP 138/70 | HR 57 | Ht 71.0 in | Wt 196.0 lb

## 2012-09-18 DIAGNOSIS — I4891 Unspecified atrial fibrillation: Secondary | ICD-10-CM

## 2012-09-18 DIAGNOSIS — D472 Monoclonal gammopathy: Secondary | ICD-10-CM

## 2012-09-18 DIAGNOSIS — I480 Paroxysmal atrial fibrillation: Secondary | ICD-10-CM | POA: Insufficient documentation

## 2012-09-18 LAB — BASIC METABOLIC PANEL
BUN: 15 mg/dL (ref 6–23)
Chloride: 101 mEq/L (ref 96–112)
Creatinine, Ser: 1.3 mg/dL (ref 0.4–1.5)
Glucose, Bld: 259 mg/dL — ABNORMAL HIGH (ref 70–99)

## 2012-09-18 LAB — CBC WITH DIFFERENTIAL/PLATELET
Basophils Absolute: 0 10*3/uL (ref 0.0–0.1)
Eosinophils Absolute: 0 10*3/uL (ref 0.0–0.7)
Eosinophils Relative: 0.7 % (ref 0.0–5.0)
MCV: 90.8 fl (ref 78.0–100.0)
Monocytes Absolute: 0.6 10*3/uL (ref 0.1–1.0)
Neutrophils Relative %: 51.8 % (ref 43.0–77.0)
Platelets: 206 10*3/uL (ref 150.0–400.0)
RDW: 13.5 % (ref 11.5–14.6)
WBC: 5.5 10*3/uL (ref 4.5–10.5)

## 2012-09-18 MED ORDER — ASPIRIN EC 325 MG PO TBEC: 325.0000 mg | DELAYED_RELEASE_TABLET | Freq: Every day | ORAL | Status: DC

## 2012-09-18 NOTE — Patient Instructions (Addendum)
Your physician recommends that you schedule a follow-up appointment in:  About 5 weeks--after 21 day event monitor finished  Your physician has requested that you have an echocardiogram. Echocardiography is a painless test that uses sound waves to create images of your heart. It provides your doctor with information about the size and shape of your heart and how well your heart's chambers and valves are working. This procedure takes approximately one hour. There are no restrictions for this procedure.   Your physician has recommended that you wear an event monitor. Event monitors are medical devices that record the heart's electrical activity. Doctors most often [place] these monitors to diagnose arrhythmias. Arrhythmias are problems with the speed or rhythm of the heartbeat. The monitor is a small, portable device. You can wear one while you do your normal daily activities. This is usually used to diagnose what is causing palpitations/syncope (passing out).   Your physician has recommended you make the following change in your medication:  Increase aspirin to 325 mg by mouth daily

## 2012-09-18 NOTE — Progress Notes (Signed)
History of Present Illness: 71 yo AAM with history of HTN, DM, HLD who is referred today for evaluation of atrial fibrillation. He has no prior cardiac issues. He was having a colonoscopy last week on 09/07/12 and was noted to have a HR of 140. EKG confirmed atrial fibrillation but no EKG is available for review. He did feel "heavy pounding" in his chest that day. He is aware of this about 1-2 times per year. No chest pain or SOB. He is not a smoker. He does not use drugs or drink alcohol.   Primary Care Physician: Oneta Rack   Past Medical History  Diagnosis Date  . Diabetes mellitus   . Hypertension   . Hyperlipidemia     Past Surgical History  Procedure Date  . Inguinal hernia repair     1968 and 2004    Current Outpatient Prescriptions  Medication Sig Dispense Refill  . aspirin 81 MG tablet Take 81 mg by mouth daily.        [name] atenolol (TENORMIN) 100 MG tablet Take 100 mg by mouth daily.        . cholecalciferol (VITAMIN D) 1000 UNITS tablet Take 3,000 Units by mouth daily.        . ferrous sulfate 325 (65 FE) MG EC tablet Take 325 mg by mouth 3 (three) times daily with meals.        . finasteride (PROSCAR) 5 MG tablet Take 5 mg by mouth daily.        [name] glyBURIDE (DIABETA) 5 MG tablet Take 5 mg by mouth daily with breakfast.        . hydrochlorothiazide (HYDRODIURIL) 25 MG tablet Take 25 mg by mouth daily.        . magnesium chloride (SLOW-MAG) 64 MG TBEC Take 250 mg by mouth daily.        . metFORMIN (GLUCOPHAGE) 500 MG tablet Take 500 mg by mouth 2 (two) times daily with a meal.        . Multiple Vitamin (MULTIVITAMIN) capsule Take 1 capsule by mouth daily.        . potassium chloride (KLOR-CON) 10 MEQ CR tablet Take 10 mEq by mouth daily.        . quinapril (ACCUPRIL) 40 MG tablet Take 40 mg by mouth at bedtime.        . simvastatin (ZOCOR) 20 MG tablet Take 20 mg by mouth at bedtime.        [name] testosterone cypionate (DEPOTESTOTERONE CYPIONATE) 200 MG/ML injection       .  vitamin C (ASCORBIC ACID) 500 MG tablet Take 500 mg by mouth daily.          Allergies  Allergen Reactions  . Penicillins     History   Social History  . Marital Status: Married    Spouse Name: N/A    Number of Children: 2  . Years of Education: N/A   Occupational History  . Retired-Director of [employer]    Social History Main Topics  . Smoking status: Never Smoker   . Smokeless tobacco: Never Used  . Alcohol Use: No  . Drug Use: No  . Sexually Active: Not on file   Other Topics Concern  . Not on file   Social History Narrative  . No narrative on file    Family History  Problem Relation Age of Onset  . Colon cancer Neg Hx   . Sickle cell anemia Mother   . Diabetes Father   .  Stomach cancer Father   . CAD Neg Hx     Review of Systems:  As stated in the HPI and otherwise negative.   BP 138/70  Pulse 57  Ht 5\' 11"  (1.803 m)  Wt 196 lb (88.905 kg)  BMI 27.34 kg/m2  Physical Examination: General: Well developed, well nourished, NAD HEENT: OP clear, mucus membranes moist SKIN: warm, dry. No rashes. Neuro: No focal deficits Musculoskeletal: Muscle strength 5/5 all ext Psychiatric: Mood and affect normal Neck: No JVD, no carotid bruits, no thyromegaly, no lymphadenopathy. Lungs:Clear bilaterally, no wheezes, rhonci, crackles Cardiovascular: Regular rate and rhythm. No murmurs, gallops or rubs. Abdomen:Soft. Bowel sounds present. Non-tender.  Extremities: No lower extremity edema. Pulses are 2 + in the bilateral DP/PT.  EKG: Sinus brady, rate 57 bpm.   Assessment and Plan:   1. Atrial fibrillation: He is in sinus brady today. Will have him wear a 21 day monitor. Will check echo to assess LVEF and exclude structural heart disease. Will increase ASA to 325 mg po Qdaily. I have discussed anti-coagulation and he will consider. Would be good candidate for Xarelto. His CHADS score is 1. Will check TSH, BMET and CBC today. F/U in 4 weeks.

## 2012-09-21 ENCOUNTER — Telehealth: Payer: Self-pay | Admitting: Cardiovascular Disease

## 2012-09-21 NOTE — Telephone Encounter (Signed)
Spoke with pt and reviewed lab results with him. I told him I would send copy to Dr. Oneta Rack. Pt will follow up with Dr. Oneta Rack for abnormal TSH.

## 2012-09-21 NOTE — Telephone Encounter (Signed)
New Problem: ° ° ° °Patient returned your call about his lab work.  Please call back. °

## 2012-09-26 ENCOUNTER — Ambulatory Visit (HOSPITAL_COMMUNITY): Payer: Medicare Other | Attending: Cardiovascular Disease | Admitting: Radiology

## 2012-09-26 DIAGNOSIS — I4891 Unspecified atrial fibrillation: Secondary | ICD-10-CM

## 2012-09-26 DIAGNOSIS — I379 Nonrheumatic pulmonary valve disorder, unspecified: Secondary | ICD-10-CM | POA: Insufficient documentation

## 2012-09-26 DIAGNOSIS — I369 Nonrheumatic tricuspid valve disorder, unspecified: Secondary | ICD-10-CM | POA: Insufficient documentation

## 2012-09-26 DIAGNOSIS — E119 Type 2 diabetes mellitus without complications: Secondary | ICD-10-CM | POA: Insufficient documentation

## 2012-09-26 DIAGNOSIS — I080 Rheumatic disorders of both mitral and aortic valves: Secondary | ICD-10-CM | POA: Insufficient documentation

## 2012-09-26 DIAGNOSIS — I10 Essential (primary) hypertension: Secondary | ICD-10-CM | POA: Insufficient documentation

## 2012-09-26 NOTE — Progress Notes (Signed)
Echocardiogram performed.  

## 2012-10-01 ENCOUNTER — Telehealth: Payer: Self-pay | Admitting: Cardiovascular Disease

## 2012-10-01 NOTE — Telephone Encounter (Signed)
Patient called 10/01/12 echo results given.

## 2012-10-01 NOTE — Telephone Encounter (Signed)
New problem    Returning call back to nurse from Friday.   

## 2012-10-10 ENCOUNTER — Telehealth: Payer: Self-pay | Admitting: *Deleted

## 2012-10-10 NOTE — Telephone Encounter (Signed)
md will be at continue education patient confirmed over the phone the new date and time on 10-29-2012 at 11:00am 

## 2012-10-23 ENCOUNTER — Other Ambulatory Visit (HOSPITAL_BASED_OUTPATIENT_CLINIC_OR_DEPARTMENT_OTHER): Payer: Medicare Other | Admitting: Lab

## 2012-10-23 DIAGNOSIS — D472 Monoclonal gammopathy: Secondary | ICD-10-CM

## 2012-10-23 LAB — COMPREHENSIVE METABOLIC PANEL (CC13)
AST: 20 U/L (ref 5–34)
Albumin: 3.6 g/dL (ref 3.5–5.0)
Alkaline Phosphatase: 66 U/L (ref 40–150)
BUN: 16 mg/dL (ref 7.0–26.0)
Creatinine: 1.4 mg/dL — ABNORMAL HIGH (ref 0.7–1.3)
Glucose: 149 mg/dl — ABNORMAL HIGH (ref 70–99)
Total Bilirubin: 0.91 mg/dL (ref 0.20–1.20)

## 2012-10-23 LAB — CBC WITH DIFFERENTIAL/PLATELET
Basophils Absolute: 0 10*3/uL (ref 0.0–0.1)
EOS%: 1.4 % (ref 0.0–7.0)
Eosinophils Absolute: 0.1 10*3/uL (ref 0.0–0.5)
HCT: 47.4 % (ref 38.4–49.9)
HGB: 16.2 g/dL (ref 13.0–17.1)
MCH: 30.7 pg (ref 27.2–33.4)
MCV: 89.8 fL (ref 79.3–98.0)
MONO%: 14.2 % — ABNORMAL HIGH (ref 0.0–14.0)
NEUT#: 2.4 10*3/uL (ref 1.5–6.5)
NEUT%: 46.5 % (ref 39.0–75.0)
RDW: 14 % (ref 11.0–14.6)

## 2012-10-25 LAB — PROTEIN ELECTROPHORESIS, SERUM, WITH REFLEX
Alpha-2-Globulin: 11.1 % (ref 7.1–11.8)
Beta 2: 7.1 % — ABNORMAL HIGH (ref 3.2–6.5)
Beta Globulin: 7.4 % — ABNORMAL HIGH (ref 4.7–7.2)
Gamma Globulin: 9.8 % — ABNORMAL LOW (ref 11.1–18.8)
Total Protein, Serum Electrophoresis: 6.2 g/dL (ref 6.0–8.3)

## 2012-10-25 LAB — BETA 2 MICROGLOBULIN, SERUM: Beta-2 Microglobulin: 2 mg/L — ABNORMAL HIGH (ref 1.01–1.73)

## 2012-10-25 LAB — KAPPA/LAMBDA LIGHT CHAINS: Kappa free light chain: 1.22 mg/dL (ref 0.33–1.94)

## 2012-10-29 ENCOUNTER — Telehealth: Payer: Self-pay | Admitting: *Deleted

## 2012-10-29 ENCOUNTER — Ambulatory Visit: Payer: Medicare Other | Admitting: Oncology

## 2012-10-29 NOTE — Telephone Encounter (Signed)
patient changed to 11-06-2012 at 9:00am

## 2012-10-30 ENCOUNTER — Ambulatory Visit (INDEPENDENT_AMBULATORY_CARE_PROVIDER_SITE_OTHER): Payer: Medicare Other | Admitting: Cardiovascular Disease

## 2012-10-30 ENCOUNTER — Ambulatory Visit: Payer: Medicare Other | Admitting: Oncology

## 2012-10-30 ENCOUNTER — Encounter: Payer: Self-pay | Admitting: Cardiovascular Disease

## 2012-10-30 VITALS — BP 126/76 | HR 59 | Ht 71.0 in | Wt 201.0 lb

## 2012-10-30 DIAGNOSIS — I4891 Unspecified atrial fibrillation: Secondary | ICD-10-CM

## 2012-10-30 NOTE — Progress Notes (Signed)
History of Present Illness: 71 yo AAM with history of HTN, DM, HLD who is here today for cardiac follow up. He was seen as a new patient 09/18/12 for  evaluation of atrial fibrillation. He has no prior cardiac issues. He was having a colonoscopy on 09/07/12 and was noted to have a HR of 140. EKG confirmed atrial fibrillation but no EKG was printed or was available for review. He did feel "heavy pounding" in his chest that day. He is aware of this about 1-2 times per year. No chest pain or SOB. He is not a smoker. He does not use drugs or drink alcohol. EKG on the day of his visit in our office showed sinus brady. Echo showed normal LV size and function with LVEF 60-65%, mild MR. His event monitor showed normal sinus rhythm with frequent PACs and rare PVCs. We discussed using ASA at his last visit and then considering full anti-coagulation with Xarelto if he had any evidence of atrial fibrillation on his event monitor. TSH was low. Full thyroid workup per primary care.   He is here today for follow up. He is feeling well. He denies any palpitations. No chest pain or SOB.   Primary Care Physician: Oneta Rack  Past Medical History  Diagnosis Date  . Diabetes mellitus   . Hypertension   . Hyperlipidemia   . Atrial fibrillation     Past Surgical History  Procedure Date  . Inguinal hernia repair     1968 and 2004    Current Outpatient Prescriptions  Medication Sig Dispense Refill  . aspirin EC 325 MG tablet Take 1 tablet (325 mg total) by mouth daily.  30 tablet  0  . atenolol (TENORMIN) 100 MG tablet Take 100 mg by mouth daily.        . cholecalciferol (VITAMIN D) 1000 UNITS tablet Take 3,000 Units by mouth daily.        . ferrous sulfate 325 (65 FE) MG EC tablet Take 325 mg by mouth 3 (three) times daily with meals.        . finasteride (PROSCAR) 5 MG tablet Take 5 mg by mouth daily.        [name] glyBURIDE (DIABETA) 5 MG tablet Take 5 mg by mouth daily with breakfast.        .  hydrochlorothiazide (HYDRODIURIL) 25 MG tablet Take 25 mg by mouth daily.        . magnesium chloride (SLOW-MAG) 64 MG TBEC Take 250 mg by mouth daily.        . metFORMIN (GLUCOPHAGE) 500 MG tablet Take 500 mg by mouth 2 (two) times daily with a meal.        . Multiple Vitamin (MULTIVITAMIN) capsule Take 1 capsule by mouth daily.        . potassium chloride (KLOR-CON) 10 MEQ CR tablet Take 10 mEq by mouth daily.        . quinapril (ACCUPRIL) 40 MG tablet Take 40 mg by mouth at bedtime.        . simvastatin (ZOCOR) 20 MG tablet Take 20 mg by mouth at bedtime.        [name] testosterone cypionate (DEPOTESTOTERONE CYPIONATE) 200 MG/ML injection       . vitamin C (ASCORBIC ACID) 500 MG tablet Take 500 mg by mouth daily.          Allergies  Allergen Reactions  . Penicillins     History   Social History  . Marital Status: Married  Spouse Name: N/A    Number of Children: 2  . Years of Education: N/A   Occupational History  . Retired-Director of [employer]    Social History Main Topics  . Smoking status: Never Smoker   . Smokeless tobacco: Never Used  . Alcohol Use: No  . Drug Use: No  . Sexually Active: Not on file   Other Topics Concern  . Not on file   Social History Narrative  . No narrative on file    Family History  Problem Relation Age of Onset  . Colon cancer Neg Hx   . Sickle cell anemia Mother   . Diabetes Father   . Stomach cancer Father   . CAD Neg Hx     Review of Systems:  As stated in the HPI and otherwise negative.   BP 126/76  Pulse 59  Ht 5\' 11"  (1.803 m)  Wt 201 lb (91.173 kg)  BMI 28.03 kg/m2  Physical Examination: General: Well developed, well nourished, NAD HEENT: OP clear, mucus membranes moist SKIN: warm, dry. No rashes. Neuro: No focal deficits Musculoskeletal: Muscle strength 5/5 all ext Psychiatric: Mood and affect normal Neck: No JVD, no carotid bruits, no thyromegaly, no lymphadenopathy. Lungs:Clear bilaterally, no wheezes,  rhonci, crackles Cardiovascular: Regular rate and rhythm. No murmurs, gallops or rubs. Abdomen:Soft. Bowel sounds present. Non-tender.  Extremities: No lower extremity edema. Pulses are 2 + in the bilateral DP/PT.  Echo 09/26/12: Left ventricle: The cavity size was normal. Wall thickness was increased in a pattern of mild LVH. Systolic function was normal. The estimated ejection fraction was in the range of 60% to 65%. Wall motion was normal; there were no regional wall motion abnormalities. - Aortic valve: Trivial regurgitation. - Mitral valve: Mild regurgitation. - Pulmonary arteries: PA peak pressure: 38mm Hg (S).  21 day Event Monitor: PACs.  Assessment and Plan:   1. Irregular heart rhythm: I have no documentation that confirms atrial fibrillation. His EKG here is sinus. 21 day monitor shows sinus with PACs, PVCs. If he were documented to have atrial fibrillation, he would need to be started on anticoagulation (CHADS score 2 with DM and HTN). Will continue ASA 325 mg po Qdaily.He will call if he has recurrence of palpitations.

## 2012-10-30 NOTE — Patient Instructions (Addendum)
Your physician wants you to follow-up in:  6 months. You will receive a reminder letter in the mail two months in advance. If you don't receive a letter, please call our office to schedule the follow-up appointment.   

## 2012-11-01 ENCOUNTER — Telehealth: Payer: Self-pay | Admitting: *Deleted

## 2012-11-01 NOTE — Telephone Encounter (Signed)
Patient confirmed over the phone that he did not want to move his appointment

## 2012-11-06 ENCOUNTER — Ambulatory Visit (HOSPITAL_BASED_OUTPATIENT_CLINIC_OR_DEPARTMENT_OTHER): Payer: Medicare Other | Admitting: Oncology

## 2012-11-06 ENCOUNTER — Telehealth: Payer: Self-pay | Admitting: *Deleted

## 2012-11-06 VITALS — BP 123/73 | HR 60 | Temp 98.3°F | Resp 20 | Ht 71.0 in | Wt 199.3 lb

## 2012-11-06 DIAGNOSIS — D472 Monoclonal gammopathy: Secondary | ICD-10-CM

## 2012-11-06 NOTE — Progress Notes (Signed)
Hematology and Oncology Follow Up Visit  Earl [name] [id number] 09-04-1941 71 y.o. 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 He has no other concerns .  Medications: I have reviewed the patient's current medications.  Allergies:  Allergies  Allergen Reactions  . Penicillins     Past Medical History, Surgical history, Social history, and Family History were reviewed and updated.  Review of Systems: Constitutional:  Negative for fever, chills, night sweats, anorexia, weight loss, pain. Cardiovascular: no chest pain or dyspnea on exertion Respiratory: no cough, shortness of breath, or wheezing Neurological: no TIA or stroke symptoms Dermatological: negative ENT: negative Skin Gastrointestinal: no abdominal pain, change in bowel habits, or black or bloody stools Genito-Urinary: no dysuria, trouble voiding, or hematuria Hematological and Lymphatic: negative Breast: negative for breast lumps negative Musculoskeletal: negative Remaining ROS negative.  Physical Exam: Blood pressure 123/73, pulse 60, temperature 98.3 F (36.8 C), temperature source Oral, resp. rate 20, height 5\' 11"  (1.803 m), weight 199 lb 4.8 oz (90.402 kg). ECOG: 0 General appearance: alert, cooperative and appears stated age Head: Normocephalic, without obvious abnormality, atraumatic Neck: no adenopathy, no carotid bruit, no JVD, supple, symmetrical, trachea midline and thyroid not enlarged, symmetric, no tenderness/mass/nodules Lymph nodes: Cervical, supraclavicular, and axillary nodes normal. Cardiac : nl Pulmonary:nl Breasts:n/a Abdomen:nl Extremitiesnl Neuro:nl  Lab Results: Lab Results  Component Value Date   WBC 5.2 10/23/2012   HGB 16.2 10/23/2012   HCT 47.4 10/23/2012   MCV 89.8 10/23/2012   PLT 151 10/23/2012    Chemistry      Component Value Date/Time   NA 140 10/23/2012 0802   NA 133* 09/18/2012 1252   K 4.1 10/23/2012 0802   K 4.2 09/18/2012 1252   CL 101 10/23/2012 0802   CL 101 09/18/2012 1252   CO2 29 10/23/2012 0802   CO2 26 09/18/2012 1252   BUN 16.0 10/23/2012 0802   BUN 15 09/18/2012 1252   CREATININE 1.4* 10/23/2012 0802   CREATININE 1.3 09/18/2012 1252      Component Value Date/Time   CALCIUM 9.2 10/23/2012 0802   CALCIUM 9.0 09/18/2012 1252   ALKPHOS 66 10/23/2012 0802   ALKPHOS 60 10/28/2011 0934   ALKPHOS 60 10/28/2011 0934   ALKPHOS 60 10/28/2011 0934   ALKPHOS 60 10/28/2011 0934   ALKPHOS 60 10/28/2011 0934   ALKPHOS 60 10/28/2011 0934   AST 20 10/23/2012 0802   AST 16 10/28/2011 0934   AST 16 10/28/2011 0934   AST 16 10/28/2011 0934   AST 16 10/28/2011 0934   AST 16 10/28/2011 0934   AST 16 10/28/2011 0934   ALT 20 10/23/2012 0802   ALT 15 10/28/2011 0934   ALT 15 10/28/2011 0934   ALT 15 10/28/2011 0934   ALT 15 10/28/2011 0934   ALT 15 10/28/2011 0934   ALT 15 10/28/2011 0934   BILITOT 0.91 10/23/2012 0802   BILITOT 0.6 10/28/2011 0934   BILITOT 0.6 10/28/2011 0934   BILITOT 0.6 10/28/2011 0934   BILITOT 0.6 10/28/2011 0934   BILITOT 0.6 10/28/2011 0934   BILITOT 0.6 10/28/2011 0934       Radiological Studies: chest X-ray n/a Mammogram n/a Bone density n/a  Impression and Plan: Hx MGUS x 6 yrs, no change in light chain levels. Lab work shows stable  m-spike, and SFLC. No changes in f/u; will continue to see on annual basis . Of note he has een on testosterone injections for 8 months, he is having his PSA checked.  More than 50% of the visit was spent in patient-related counselling   Earl Crane, MD 12/17/201310:45 AM

## 2012-11-06 NOTE — Telephone Encounter (Signed)
Will contact patient with one year appointment

## 2012-11-10 ENCOUNTER — Telehealth: Payer: Self-pay | Admitting: *Deleted

## 2012-11-10 NOTE — Telephone Encounter (Signed)
former patient of dr.rubin re-establishing with murinson / [place] mailed out calendar to inform the patient of the new date and time on 11-07-2013

## 2012-12-22 ENCOUNTER — Encounter: Payer: Self-pay | Admitting: Oncology

## 2012-12-22 ENCOUNTER — Telehealth: Payer: Self-pay | Admitting: Oncology

## 2012-12-22 NOTE — Telephone Encounter (Signed)
Former PR pt reassigned to DM. S/w pt re new provider appts for December 2015. Pt states that he has been coming for about 6yrs with no new findings and will check w/his primary who referred him at his next visit in 3mos to see if he should continue with his now annual appts here. Pt states he would like to stop coming and if primary agrees he will call back to cx. Letter mailed.

## 2013-04-29 ENCOUNTER — Other Ambulatory Visit (HOSPITAL_COMMUNITY): Payer: Self-pay | Admitting: Internal Medicine

## 2013-04-29 ENCOUNTER — Ambulatory Visit (HOSPITAL_COMMUNITY)
Admission: RE | Admit: 2013-04-29 | Discharge: 2013-04-29 | Disposition: A | Discharge status: Home / Self Care | Payer: Medicare Other | Source: Ambulatory Visit | Attending: Internal Medicine | Admitting: Internal Medicine

## 2013-04-29 DIAGNOSIS — R221 Localized swelling, mass and lump, neck: Secondary | ICD-10-CM

## 2013-04-29 DIAGNOSIS — E079 Disorder of thyroid, unspecified: Secondary | ICD-10-CM | POA: Insufficient documentation

## 2013-05-02 ENCOUNTER — Ambulatory Visit: Payer: Medicare Other | Admitting: Cardiovascular Disease

## 2013-05-06 ENCOUNTER — Other Ambulatory Visit: Payer: Self-pay | Admitting: Internal Medicine

## 2013-05-06 DIAGNOSIS — E079 Disorder of thyroid, unspecified: Secondary | ICD-10-CM

## 2013-05-08 ENCOUNTER — Ambulatory Visit
Admission: RE | Admit: 2013-05-08 | Discharge: 2013-05-08 | Disposition: A | Discharge status: Home / Self Care | Payer: Medicare Other | Source: Ambulatory Visit | Attending: Internal Medicine | Admitting: Internal Medicine

## 2013-05-08 DIAGNOSIS — E079 Disorder of thyroid, unspecified: Secondary | ICD-10-CM

## 2013-05-08 MED ORDER — IOHEXOL 300 MG/ML  SOLN
75.0000 mL | Freq: Once | INTRAMUSCULAR | Status: AC | PRN
  Administered 2013-05-08: 75 mL via INTRAVENOUS

## 2013-05-09 ENCOUNTER — Other Ambulatory Visit: Payer: Self-pay | Admitting: Internal Medicine

## 2013-05-09 DIAGNOSIS — E041 Nontoxic single thyroid nodule: Secondary | ICD-10-CM

## 2013-05-15 ENCOUNTER — Ambulatory Visit
Admission: RE | Admit: 2013-05-15 | Discharge: 2013-05-15 | Disposition: A | Discharge status: Home / Self Care | Payer: Medicare Other | Source: Ambulatory Visit | Attending: Internal Medicine | Admitting: Internal Medicine

## 2013-05-15 ENCOUNTER — Other Ambulatory Visit (HOSPITAL_COMMUNITY)
Admission: RE | Admit: 2013-05-15 | Discharge: 2013-05-15 | Disposition: A | Discharge status: Home / Self Care | Payer: Medicare Other | Source: Ambulatory Visit | Attending: Interventional Radiology | Admitting: Interventional Radiology

## 2013-05-15 DIAGNOSIS — E041 Nontoxic single thyroid nodule: Secondary | ICD-10-CM

## 2013-05-15 DIAGNOSIS — E049 Nontoxic goiter, unspecified: Secondary | ICD-10-CM | POA: Insufficient documentation

## 2013-10-31 ENCOUNTER — Other Ambulatory Visit: Payer: Self-pay | Admitting: Internal Medicine

## 2013-10-31 ENCOUNTER — Other Ambulatory Visit (HOSPITAL_BASED_OUTPATIENT_CLINIC_OR_DEPARTMENT_OTHER): Payer: Medicare Other

## 2013-10-31 DIAGNOSIS — D472 Monoclonal gammopathy: Secondary | ICD-10-CM

## 2013-10-31 LAB — COMPREHENSIVE METABOLIC PANEL (CC13)
AST: 18 U/L (ref 5–34)
Albumin: 3.6 g/dL (ref 3.5–5.0)
Alkaline Phosphatase: 66 U/L (ref 40–150)
Glucose: 129 mg/dl (ref 70–140)
Potassium: 3.9 mEq/L (ref 3.5–5.1)
Sodium: 141 mEq/L (ref 136–145)
Total Protein: 6.2 g/dL — ABNORMAL LOW (ref 6.4–8.3)

## 2013-10-31 LAB — LACTATE DEHYDROGENASE (CC13): LDH: 140 U/L (ref 125–245)

## 2013-10-31 LAB — CBC WITH DIFFERENTIAL/PLATELET
EOS%: 2.1 % (ref 0.0–7.0)
Eosinophils Absolute: 0.1 10*3/uL (ref 0.0–0.5)
MCV: 86.6 fL (ref 79.3–98.0)
MONO%: 9.9 % (ref 0.0–14.0)
NEUT#: 2.6 10*3/uL (ref 1.5–6.5)
RBC: 4.46 10*6/uL (ref 4.20–5.82)
RDW: 13.3 % (ref 11.0–14.6)
lymph#: 2.7 10*3/uL (ref 0.9–3.3)

## 2013-11-04 LAB — IMMUNOFIXATION ELECTROPHORESIS

## 2013-11-04 LAB — KAPPA/LAMBDA LIGHT CHAINS
Kappa free light chain: 1.14 mg/dL (ref 0.33–1.94)
Kappa:Lambda Ratio: 0.69 (ref 0.26–1.65)
Lambda Free Lght Chn: 1.65 mg/dL (ref 0.57–2.63)

## 2013-11-07 ENCOUNTER — Encounter: Payer: Self-pay | Admitting: Internal Medicine

## 2013-11-07 ENCOUNTER — Encounter (INDEPENDENT_AMBULATORY_CARE_PROVIDER_SITE_OTHER): Payer: Self-pay

## 2013-11-07 ENCOUNTER — Telehealth: Payer: Self-pay | Admitting: Internal Medicine

## 2013-11-07 ENCOUNTER — Ambulatory Visit (HOSPITAL_BASED_OUTPATIENT_CLINIC_OR_DEPARTMENT_OTHER): Payer: Medicare Other | Admitting: Internal Medicine

## 2013-11-07 VITALS — BP 128/64 | HR 57 | Temp 97.7°F | Resp 18 | Ht 71.0 in | Wt 197.5 lb

## 2013-11-07 DIAGNOSIS — E785 Hyperlipidemia, unspecified: Secondary | ICD-10-CM | POA: Insufficient documentation

## 2013-11-07 DIAGNOSIS — E0510 Thyrotoxicosis with toxic single thyroid nodule without thyrotoxic crisis or storm: Secondary | ICD-10-CM

## 2013-11-07 DIAGNOSIS — D472 Monoclonal gammopathy: Secondary | ICD-10-CM

## 2013-11-07 DIAGNOSIS — D649 Anemia, unspecified: Secondary | ICD-10-CM | POA: Insufficient documentation

## 2013-11-07 DIAGNOSIS — I10 Essential (primary) hypertension: Secondary | ICD-10-CM

## 2013-11-07 DIAGNOSIS — R944 Abnormal results of kidney function studies: Secondary | ICD-10-CM

## 2013-11-07 DIAGNOSIS — E1169 Type 2 diabetes mellitus with other specified complication: Secondary | ICD-10-CM | POA: Insufficient documentation

## 2013-11-07 DIAGNOSIS — E7800 Pure hypercholesterolemia, unspecified: Secondary | ICD-10-CM

## 2013-11-07 DIAGNOSIS — E291 Testicular hypofunction: Secondary | ICD-10-CM

## 2013-11-07 DIAGNOSIS — E789 Disorder of lipoprotein metabolism, unspecified: Secondary | ICD-10-CM

## 2013-11-07 DIAGNOSIS — E119 Type 2 diabetes mellitus without complications: Secondary | ICD-10-CM

## 2013-11-07 NOTE — Progress Notes (Signed)
[facility] OFFICE PROGRESS NOTE  MCKEOWN,WILLIAM Andretta Ergle, MD [address] [place] [address]  DIAGNOSIS: MGUS (monoclonal gammopathy of unknown significance)  Atrial fibrillation  Anemia, unspecified  DM2 (diabetes mellitus, type 2)  Hypertension  Hypercholesteremia  Chief Complaint  Patient presents with  . MGUS (monoclonal gammopathy of unknown significance)    CURRENT THERAPY:  Observation.   INTERVAL HISTORY: Earl [name] 72 y.o. male with a history of MGUS is here for one-year follow-up.  He was last seen by Dr. Donnie Coffin on 11/06/2012.   He denies any recent hospitalizations.  He reports everything is fine.  He stated that he  Had a mass growing on his thyroid that was biopsied and determined to be non-malignant.  He is on a medication to "shrink" it.  This is being followed by his PCP.  He denies bone pain.  He denies recurrent infections.  He does report intermittent use of alleve for joint aches and pain.   MEDICAL HISTORY: Past Medical History  Diagnosis Date  . Diabetes mellitus   . Hypertension   . Hyperlipidemia   . Atrial fibrillation     INTERIM HISTORY: has Atrial fibrillation; MGUS (monoclonal gammopathy of unknown significance); Anemia, unspecified; DM2 (diabetes mellitus, type 2); Hypertension; and Hypercholesteremia on his problem list.    ALLERGIES:  is allergic to penicillins.  MEDICATIONS: has a current medication list which includes the following prescription(s): aspirin ec, atenolol, cholecalciferol, ferrous sulfate, finasteride, glyburide, hydrochlorothiazide, magnesium chloride, multivitamin, potassium chloride, quinapril, simvastatin, testosterone cypionate, vitamin c, and metformin.  SURGICAL HISTORY:  Past Surgical History  Procedure Laterality Date  . Inguinal hernia repair      1968 and 2004    REVIEW OF SYSTEMS:   Constitutional: Denies fevers, chills or abnormal weight loss Eyes: Denies  blurriness of vision Ears, nose, mouth, throat, and face: Denies mucositis or sore throat Respiratory: Denies cough, dyspnea or wheezes Cardiovascular: Denies palpitation, chest discomfort or lower extremity swelling Gastrointestinal:  Denies nausea, heartburn or change in bowel habits Skin: Denies abnormal skin rashes Lymphatics: Denies new lymphadenopathy or easy bruising Neurological:Denies numbness, tingling or new weaknesses Behavioral/Psych: Mood is stable, no new changes  All other systems were reviewed with the patient and are negative.  PHYSICAL EXAMINATION: ECOG PERFORMANCE STATUS: 0 - Asymptomatic  Blood pressure 128/64, pulse 57, temperature 97.7 F (36.5 C), temperature source Oral, resp. rate 18, height 5\' 11"  (1.803 m), weight 197 lb 8 oz (89.585 kg).  GENERAL:alert, no distress and comfortable SKIN: skin color, texture, turgor are normal, no rashes or significant lesions EYES: normal, Conjunctiva are pink and non-injected, sclera clear OROPHARYNX:no exudate, no erythema and lips, buccal mucosa, and tongue normal  NECK: supple, thyroid normal size, non-tender, without nodularity LYMPH:  no palpable lymphadenopathy in the cervical, axillary or supraclavicular LUNGS: clear to auscultation and percussion with normal breathing effort HEART: regular rate & rhythm and no murmurs and no lower extremity edema ABDOMEN:abdomen soft, non-tender and normal bowel sounds Musculoskeletal:no cyanosis of digits and no clubbing  NEURO: alert & oriented x 3 with fluent speech, no focal motor/sensory deficits  Labs:  Lab Results  Component Value Date   WBC 6.1 10/31/2013   HGB 13.0 10/31/2013   HCT 38.7 10/31/2013   MCV 86.6 10/31/2013   PLT 185 10/31/2013   NEUTROABS 2.6 10/31/2013      Chemistry      Component Value Date/Time   NA 141 10/31/2013 1505   NA 133* 09/18/2012 1252  K 3.9 10/31/2013 1505   K 4.2 09/18/2012 1252   CL 101 10/23/2012 0802   CL 101 09/18/2012 1252    CO2 26 10/31/2013 1505   CO2 26 09/18/2012 1252   BUN 20.3 10/31/2013 1505   BUN 15 09/18/2012 1252   CREATININE 1.5* 10/31/2013 1505   CREATININE 1.3 09/18/2012 1252      Component Value Date/Time   CALCIUM 9.5 10/31/2013 1505   CALCIUM 9.0 09/18/2012 1252   ALKPHOS 66 10/31/2013 1505   ALKPHOS 60 10/28/2011 0934   AST 18 10/31/2013 1505   AST 16 10/28/2011 0934   ALT 17 10/31/2013 1505   ALT 15 10/28/2011 0934   BILITOT 0.36 10/31/2013 1505   BILITOT 0.6 10/28/2011 0934     Basic Metabolic Panel:  Recent Labs Lab 10/31/13 1505  NA 141  K 3.9  CO2 26  GLUCOSE 129  BUN 20.3  CREATININE 1.5*  CALCIUM 9.5   GFR Estimated Creatinine Clearance: 47.4 ml/min (by C-G formula based on Cr of 1.5). Liver Function Tests:  Recent Labs Lab 10/31/13 1505  AST 18  ALT 17  ALKPHOS 66  BILITOT 0.36  PROT 6.2*  ALBUMIN 3.6   CBC:  Recent Labs Lab 10/31/13 1505  WBC 6.1  NEUTROABS 2.6  HGB 13.0  HCT 38.7  MCV 86.6  PLT 185   Results for Earl [name], Earl [name] (MRN [id number]) as of 11/07/2013 08:55  Ref. Range 10/23/2012 08:02  Beta-2 Microglobulin Latest Range: 1.01-1.73 mg/L 2.00 (H)  Albumin ELP Latest Range: 55.8-66.1 % 60.4  COMMENT (PROTEIN ELECTROPHOR) No range found *  Alpha-1-Globulin Latest Range: 2.9-4.9 % 4.2  Alpha-2-Globulin Latest Range: 7.1-11.8 % 11.1  Beta Globulin Latest Range: 4.7-7.2 % 7.4 (H)  Beta 2 Latest Range: 3.2-6.5 % 7.1 (H)  Gamma Globulin Latest Range: 11.1-18.8 % 9.8 (L)  M-SPIKE, % No range found NOT DET  SPE Interp. No range found *  Total Protein, Serum Electrophoresis Latest Range: 6.0-8.3 g/dL 6.2  Kappa free light chain Latest Range: 0.33-1.94 mg/dL 7.82  Lambda Free Lght Chn Latest Range: 0.57-2.63 mg/dL 9.56  Kappa:Lambda Ratio Latest Range: 0.26-1.65  0.70   Results for Earl [name], Earl [name] (MRN [id number]) as of 11/07/2013 08:55  Ref. Range 10/31/2013 15:05  IgG (Immunoglobin G), Serum Latest Range: [phone] 122-1294 mg/dL 469 (L)   IgA Latest Range: 68-379 mg/dL 629  IgM, Serum Latest Range: 41-251 mg/dL 15 (L)  Total Protein, Serum Electrophoresis Latest Range: 6.0-8.3 g/dL 6.0  Kappa free light chain Latest Range: 0.33-1.94 mg/dL 5.28  Lambda Free Lght Chn Latest Range: 0.57-2.63 mg/dL 4.13  Kappa:Lambda Ratio Latest Range: 0.26-1.65  0.69   PATHOLOGY: BONE MARROW REPORT  Case #: [id number] Patient Name: Earl [name], Earl [name] PID: [id number] Pathologist: Havery Moros, MD DOB/Age 24-May-1941 (Age: 24) Gender: M Date Taken: 02/14/2003 Date Received: 02/14/2003  FINAL DIAGNOSIS  MICROSCOPIC EXAMINATION AND DIAGNOSIS  BONE MARROW, LEFT POSTERIOR ILIAC CREST, ASPIRATE AND BIOPSY: - NORMOCELLULAR MARROW WITH TRILINEAGE HEMATOPOIESIS AND 2% PLASMA CELLS - FEW LYMPHOID AGGREGATES, SEE COMMENT - ABUNDANT IRON STORES  PERIPHERAL BLOOD: MILD NORMOCYTIC-NORMOCHROMIC ANEMIA  kv Date Reported: 02/17/2003 Havery Moros, MD  Electronically Signed Out By BNS   Clinical information Myeloma/gammopathy. ([facility])  specimen(s) obtained Bone marrow, biopsy and aspirate, L-PCIS  Gross Description Received in [place] is a 1.8 x 0.7 x 0.4 cm aggregate of tan red soft material which is submitted in one block.  Also received in [place] in a separate container is a 0.4 x 0.2 cm  core of tan red firm tissue which is submitted in one block following decalcification. (SSW:kcv 3-26)  kv/ MICROSCOPIC DESCRIPTION LAB DATA: CBC performed on 02/14/03 shows:  WBC 4,200/ul HB 12.8g/dl HCT 16.1% MCV 09.6EA RDW 11.8% PLT 170,000/ul  Additional lab data: None  PERIPHERAL BLOOD: The red blood cells show minimal anisopoikilocytosis with mild polychromasia. Obvious rouleaux formation is not identified. A manual WBC differential count shows 37% neutrophils, 1% bands, 46% lymphocytes, 15% monocytes and 1% eosinophils. Plasmacytoid lymphocytes are scattered. The platelets are abundant and normogranular.  BONE MARROW ASPIRATE: The  aspirate material is of moderate cellularity and shows trilineage hematopoiesis. The erythroid and granulocytic series show orderly and progressive maturation. Megakaryocytes are abundant with normal morphology. Plasma cells represent 2% of all cells in the marrow and consist of small mature appearing cells. Occasional small clusters composed of few cells are seen. Large sheets of plasma cells are not identified.  Bone Marrow Count performed on 500 cells shows:  Blasts: 2% Myeloid: 46% Promyelocytes: 3% Myelocytes: 10% Erythroid: 34% Metamyelocytes: 8% Bands: 10% Lymphocytes: 16% Neutrophils: 7% Eosinophils: 6% Plasma Cells: 2% Basophils: 0% Monocytes: 2% M:E Ratio: 1.3:1 TOUCH PREPARATIONS: Similar to aspirate  SECTIONS OF CLOT: Sections show cellular marrow particles displaying a mixture of cell types. There are a few well circumscribed interstitial predominantly small lymphoid aggregates composed of small lymphoid cells. These are occasionally admixed with histiocytes. Large sheets of plasma cells are not identified.  BONE MARROW CORE BIOPSY: Sections show a small biopsy displaying 50-60% cellularity. There is a mixture of cell types. Large sheets of plasma cells are not identified.  IRON STAIN: Iron stains are performed on a bone marrow aspirate smear and section of clot. The controls stained appropriately.  Storage Iron: Abundant Ring Sideroblasts: Absent  COMMENT: The plasma cells represent 2% of all cells in the sample associated with an occasional small cluster. Large sheets of plasma cells are not identified. The features are nonspecific. In addition there are a few well circumscribed small lymphoid aggregates, the morphology of which favor a benign process. However, a sample for Flow Cytometric analysis is not available. Clinical correlation is recommended. (BNS:kcv 3-29)   RADIOGRAPHIC STUDIES: DG BONE SURVERY METASTATIC   06/25/2002 FINDINGS  CLINICAL DATA:  MONOCLONAL GAMMOPATHY OF UNCERTAIN SIGNIFICANCE, QUESTION LYTIC LESIONS.  METASTATIC BONE SURVEY  LATERAL VIEW OF THE SKULL IS NORMAL.  AP AND LATERAL FILMS OF THE CERVICAL SPINE SHOW NO BONY ABNORMALITY.  AP FILMS OF SHOULDERS SHOW DEGENERATIVE CHANGES OF THE AC JOINTS AND OTHERWISE UNREMARKABLE BONES.  AP FILMS OF BOTH ARMS TO THE LEVEL OF THE ELBOWS ARE UNREMARKABLE.  AP, LATERAL, AND SWIMMER'S LATERAL VIEWS OF THE THORACIC SPINE SHOW MILD DEGENERATIVE SPURRING WITH  NO ACUTE BONY ABNORMALITY.  AP AND LATERAL VIEWS OF THE LUMBAR SPINE ARE UNREMARKABLE.  AP FILM OF THE PELVIS AND AP FILMS OF BOTH LOWER EXTREMITIES TO THE KNEES ARE UNREMARKABLE.  IMPRESSION  MILD DEGENERATIVE CHANGES, OTHERWISE NORMAL.  CC: [name], M.D.  ASSESSMENT: Earl [name] 72 y.o. male with a history of MGUS (monoclonal gammopathy of unknown significance)  Atrial fibrillation  Anemia, unspecified  DM2 (diabetes mellitus, type 2)  Hypertension  Hypercholesteremia   PLAN:   1. IgA lambda  MGUS. -- Patient continues to do well.  He has a history of  MGUS x 7 yrs, no change in light chain levels. His bone marrow on 02/14/2003 was negative.  Lab work shows no m-spike, and SFLC. No changes in f/u; will continue to see  on annual basis . We discussed 1 out of 100 individuals progress to Multiple myeloma per year.    2. DM2. -- Continue glyburide 5 mg daily, metformin 500 mg four times daily, per PCP.  3. Hypertension. --Continue Hydrochlorothiazide 25 mg daily; Quinapril 40 mg daily.   4. Low testosterone.  --Testosterone cypionate 200 mg/ml injection.  Started on 09/05/2012.  5. Elevated Cholesterol. --Continue simvastatin 20 mg daily.   6. Hyperthyroidism, thyroid nodule. --He states compliance to a "thyroid medicine".   Following with his PCP.  7. Elevated Creatinine.  -- Creatinine mildly up to 1.5.  Counseled him to avoid NSAIDS and continue hydration with at least 6 glasses of water  daily.  He should follow this up with his PCP.   8. Follow-up.  --Patient will follow-up annually for SPEP, UPEP, Kappa lambda light chains and chemistries and CBC.   All questions were answered. The patient knows to call the clinic with any problems, questions or concerns. We can certainly see the patient much sooner if necessary.  I spent 15 minutes counseling the patient face to face. The total time spent in the appointment was 25 minutes.    Kissa Campoy, MD 11/07/2013 9:06 AM

## 2013-11-07 NOTE — Patient Instructions (Signed)
Multiple Myeloma Multiple myeloma is the most common cancer of bone. It is caused by the uncontrolled multiplication of a type of white blood cell in the marrow. This white blood cell is called a plasma cell. This means the bone marrow is overworking producing plasma cells. Soon these overproduced cells begin to take up room in the marrow that is needed by other cells. This means that there are soon not enough red or white blood cells or platelets. Not enough red cells mean that the person is anemic. There are not enough red blood cells to carry oxygen around the body. There are not enough white blood cells to fight disease. This causes the person with multiple myeloma to not feel well. There is also bone pain through much of the body. SYMPTOMS  Anemia causes fatigue (tiredness) and weakness.  Back pain is common. This is from fractures (break in bones) caused by damage to the bones of the back.  Lack of white blood cells makes infection more likely.  Bleeding is a common problem from lack of the cells (platelets). Platelets help blood clots form. This may show up as bleeding from any place. Commonly this shows up as bleeding from the nose or gums.  Fractures (bone breaks) are more common anywhere. The back and ribs are the most commonly fractured areas. DIAGNOSIS  This tumor is often suggested by blood tests. Often doing a bone marrow sample makes the diagnosis (learning what is wrong). This is a test performed by taking a small sample of bone with a small needle. This bone often comes from the sternum (breast bone). This sample is sent to a pathologist (a specialist in looking at tissue under a microscope). After looking at the sample under the microscope, the pathologist is able to make a diagnosis of the problem. X-rays may also show boney changes. TREATMENT   Occasionally, anti-cancer medications may be used with multiple myeloma. Your caregiver can discuss this with you.  Medications can  also be given to help with the bone pain.  There is no cure for multiple myeloma. Lifestyle changes can add years of quality living. HOME CARE INSTRUCTIONS  Often there is no specific treatment for multiple myeloma. Most of the treatment consists of adjustments in dietary and living activities. Some of these changes include:  Your dietitian or caregiver helping you with your dietary questions.  Taking iron and vitamins as prescribed by your caregiver.  Eating a well balanced diet.  Staying active, but follow restrictions suggested by your caregiver. Avoiding heavy lifting (more than 10 pounds) and activities that cause increased pain.  Drinking plenty of water.  Using back braces and a cane may help with some of the boney pain. SEEK IMMEDIATE MEDICAL CARE IF:  You develop severe, uncontrolled boney pain.  You or your family notices confusion, problems with decision-making or inability to stay awake.  You notice increased urination or constipation.  You notice problems holding your water or stool.  You have numbness or loss of control of your extremities (arms/hands or legs/feet). Document Released: 08/02/2001 Document Revised: 01/30/2012 Document Reviewed: 11/02/2008 [facility] Patient Information 2014 [place], [place]. Hypomagnesemia Magnesium is a common ion (mineral) in the body which is needed for metabolism. It is about how the body handles food and other chemical reactions necessary for life. Only about 2% of the magnesium in our body is found in the blood. When this is low, it is called hypomagnesemia. The blood will measure only a tiny amount of the magnesium  in our body. When it is low in our blood, it does not mean that the whole body supply is low. The normal serum concentration ranges from 1.8-2.5 mEq/L. When the level gets to be less than 1.0 mEq/L, a number of problems begin to happen.  CAUSES   Receiving intravenous fluids without magnesium replacement.  Loss of  magnesium from the bowel by naso-gastric suction.  Loss of magnesium from nausea and vomiting or severe diarrhea. Any of the inflammatory bowel conditions can cause this.  Abuse of alcohol often leads to low serum magnesium.  An inherited form of magnesium loss happens when the kidneys lose magnesium. This is called familial or primary hypomagnesemia.  Some medications such as diuretics also cause the loss of magnesium. SYMPTOMS  These following problems are worse if the changes in magnesium levels come on suddenly.  Tremor.  Confusion.  Muscle weakness.  Over-sensitive to sights and sounds.  Sensitive reflexes.  Depression.  Muscular fibrillations.  Over-reactivity of the nerves.  Irritability.  Psychosis.  Spasms of the hand muscles.  Tetany (where the muscles go into uncontrollable spasms). DIAGNOSIS  This condition can be diagnosed by blood tests. TREATMENT   In emergency, magnesium can be given intravenously (by vein).  If the condition is less worrisome, it can be corrected by diet. High levels of magnesium are found in green leafy vegetables, peas, beans and nuts among other things. It can also be given through medications by mouth.  If it is being caused by medications, changes can be made.  If alcohol is a problem, help is available if there are difficulties giving it up. Document Released: 08/03/2005 Document Revised: 01/30/2012 Document Reviewed: 06/27/2008 [facility] Patient Information 2014 [place], [place]. Hypokalemia Hypokalemia means a low potassium level in the blood.Potassium is an electrolyte that helps regulate the amount of fluid in the body. It also stimulates muscle contraction and maintains a stable acid-base balance.Most of the body's potassium is inside of cells, and only a very small amount is in the blood. Because the amount in the blood is so small, minor changes can have big effects. PREPARATION FOR TEST Testing for potassium requires  taking a blood sample taken by needle from a vein in the arm. The skin is cleaned thoroughly before the sample is drawn. There is no other special preparation needed. NORMAL VALUES Potassium levels below 3.5 mEq/L are abnormally low. Levels above 5.1 mEq/L are abnormally high. Ranges for normal findings may vary among different laboratories and hospitals. You should always check with your doctor after having lab work or other tests done to discuss the meaning of your test results and whether your values are considered within normal limits. MEANING OF TEST  Your caregiver will go over the test results with you and discuss the importance and meaning of your results, as well as treatment options and the need for additional tests, if necessary. A potassium level is frequently part of a routine medical exam. It is usually included as part of a whole "panel" of tests for several blood salts (such as Sodium and Chloride). It may be done as part of follow-up when a low potassium level was found in the past or other blood salts are suspected of being out of balance. A low potassium level might be suspected if you have one or more of the following:  Symptoms of weakness.  Abnormal heart rhythms.  High blood pressure and are taking medication to control this, especially water pills (diuretics).  Kidney disease that can  affect your potassium level .  Diabetes requiring the use of insulin. The potassium may fall after taking insulin, especially if the diabetes had been out of control for a while.  A condition requiring the use of cortisone-type medication or certain types of antibiotics.  Vomiting and/or diarrhea for more than a day or two.  A stomach or intestinal condition that may not permit appropriate absorption of potassium.  Fainting episodes.  Mental confusion. OBTAINING TEST RESULTS It is your responsibility to obtain your test results. Ask the lab or department performing the test when and  how you will get your results.  Please contact your caregiver directly if you have not received the results within one week. At that time, ask if there is anything different or new you should be doing in relation to the results. TREATMENT Hypokalemia can be treated with potassium supplements taken by mouth and/or adjustments in your current medications. A diet high in potassium is also helpful. Foods with high potassium content are:  Peas, lentils, lima beans, nuts, and dried fruit.  Whole grain and bran cereals and breads.  Fresh fruit, vegetables (bananas, cantaloupe, grapefruit, oranges, tomatoes, honeydew melons, potatoes).  Orange and tomato juices.  Meats. If potassium supplement has been prescribed for you today or your medications have been adjusted, see your personal caregiver in time02 for a re-check. SEEK MEDICAL CARE IF:  There is a feeling of worsening weakness.  You experience repeated chest palpitations.  You are diabetic and having difficulty keeping your blood sugars in the normal range.  You are experiencing vomiting and/or diarrhea.  You are having difficulty with any of your regular medications. SEEK IMMEDIATE MEDICAL CARE IF:  You experience chest pain, shortness of breath, or episodes of dizziness.  You have been having vomiting or diarrhea for more than 2 days.  You have a fainting episode. MAKE SURE YOU:   Understand these instructions.  Will watch your condition.  Will get help right away if you are not doing well or get worse. Document Released: 11/07/2005 Document Revised: 01/30/2012 Document Reviewed: 05/10/2013 [facility] Patient Information 2014 [place], [place].

## 2013-11-07 NOTE — Telephone Encounter (Signed)
gv and printed appt sched and avs for pt for DEc 2015

## 2013-12-28 DIAGNOSIS — E119 Type 2 diabetes mellitus without complications: Secondary | ICD-10-CM | POA: Insufficient documentation

## 2013-12-28 DIAGNOSIS — E1122 Type 2 diabetes mellitus with diabetic chronic kidney disease: Secondary | ICD-10-CM | POA: Insufficient documentation

## 2013-12-28 DIAGNOSIS — E349 Endocrine disorder, unspecified: Secondary | ICD-10-CM | POA: Insufficient documentation

## 2013-12-28 DIAGNOSIS — N400 Enlarged prostate without lower urinary tract symptoms: Secondary | ICD-10-CM | POA: Insufficient documentation

## 2014-01-03 ENCOUNTER — Ambulatory Visit (INDEPENDENT_AMBULATORY_CARE_PROVIDER_SITE_OTHER): Payer: Medicare Other | Admitting: Internal Medicine

## 2014-01-03 ENCOUNTER — Encounter: Payer: Self-pay | Admitting: Internal Medicine

## 2014-01-03 VITALS — BP 110/76 | HR 72 | Temp 97.5°F | Resp 18 | Ht 71.5 in | Wt 190.6 lb

## 2014-01-03 DIAGNOSIS — J0410 Acute tracheitis without obstruction: Secondary | ICD-10-CM

## 2014-01-03 DIAGNOSIS — Z0000 Encounter for general adult medical examination without abnormal findings: Secondary | ICD-10-CM

## 2014-01-03 DIAGNOSIS — E119 Type 2 diabetes mellitus without complications: Secondary | ICD-10-CM

## 2014-01-03 DIAGNOSIS — D472 Monoclonal gammopathy: Secondary | ICD-10-CM

## 2014-01-03 DIAGNOSIS — E7800 Pure hypercholesterolemia, unspecified: Secondary | ICD-10-CM

## 2014-01-03 DIAGNOSIS — Z1212 Encounter for screening for malignant neoplasm of rectum: Secondary | ICD-10-CM

## 2014-01-03 DIAGNOSIS — Z125 Encounter for screening for malignant neoplasm of prostate: Secondary | ICD-10-CM

## 2014-01-03 DIAGNOSIS — I10 Essential (primary) hypertension: Secondary | ICD-10-CM

## 2014-01-03 DIAGNOSIS — Z79899 Other long term (current) drug therapy: Secondary | ICD-10-CM | POA: Insufficient documentation

## 2014-01-03 DIAGNOSIS — E559 Vitamin D deficiency, unspecified: Secondary | ICD-10-CM

## 2014-01-03 LAB — CBC WITH DIFFERENTIAL/PLATELET
BASOS PCT: 1 % (ref 0–1)
Basophils Absolute: 0 10*3/uL (ref 0.0–0.1)
EOS ABS: 0 10*3/uL (ref 0.0–0.7)
EOS PCT: 1 % (ref 0–5)
HCT: 38 % — ABNORMAL LOW (ref 39.0–52.0)
HEMOGLOBIN: 13.5 g/dL (ref 13.0–17.0)
LYMPHS ABS: 2 10*3/uL (ref 0.7–4.0)
Lymphocytes Relative: 57 % — ABNORMAL HIGH (ref 12–46)
MCH: 28.6 pg (ref 26.0–34.0)
MCHC: 35.5 g/dL (ref 30.0–36.0)
MCV: 80.5 fL (ref 78.0–100.0)
MONOS PCT: 14 % — AB (ref 3–12)
Monocytes Absolute: 0.5 10*3/uL (ref 0.1–1.0)
Neutro Abs: 1 10*3/uL — ABNORMAL LOW (ref 1.7–7.7)
Neutrophils Relative %: 27 % — ABNORMAL LOW (ref 43–77)
Platelets: 160 10*3/uL (ref 150–400)
RBC: 4.72 MIL/uL (ref 4.22–5.81)
RDW: 13.8 % (ref 11.5–15.5)
WBC: 3.5 10*3/uL — ABNORMAL LOW (ref 4.0–10.5)

## 2014-01-03 LAB — HEMOGLOBIN A1C
HEMOGLOBIN A1C: 7.4 % — AB (ref <5.7)
MEAN PLASMA GLUCOSE: 166 mg/dL — AB (ref <117)

## 2014-01-03 MED ORDER — HYDROCODONE-ACETAMINOPHEN 5-325 MG PO TABS: ORAL_TABLET | ORAL | Status: DC

## 2014-01-03 MED ORDER — PREDNISONE 20 MG PO TABS: ORAL_TABLET | ORAL | Status: DC

## 2014-01-03 MED ORDER — AZITHROMYCIN 250 MG PO TABS: ORAL_TABLET | ORAL | Status: AC

## 2014-01-03 MED ORDER — SIMVASTATIN 40 MG PO TABS: ORAL_TABLET | ORAL | Status: DC

## 2014-01-03 NOTE — Progress Notes (Signed)
Patient ID: Earl [name], male   DOB: March 18, 1941, 73 y.o.   MRN: [id number]   Annual Screening Comprehensive Examination  This very nice 73 y.o.  MWM presents for complete physical.  Patient has been followed for HTN, Diabetes  Prediabetes, Hyperlipidemia, and Vitamin D Deficiency. Pt also relate 3-4 day hx/o head and chest congestion.   HTN predates since 49. Patient's BP has been controlled at home.Today's BP: 110/76 mmHg. In 2013, he had a short bout of pAfib during a colonoscopy and had an unremarkable f/u 21 day cardiac event monitor so permanent anticoagulant Tx was not felt indicated by Dr Angelena Form.  Patient denies any cardiac symptoms as chest pain, palpitations, shortness of breath, dizziness or ankle swelling.   Patient's hyperlipidemia is controlled with diet and medications. Patient denies myalgias or other medication SE's. Last cholesterol last visit was 105, triglycerides 90, HDL 43 and LDL 44 in Sept - all at goal.     He also has [place] which is followed annually at the [facility].   Patient has T2 NIDDM since 1994 and monitors with only DU and A1c's have generally been great over the years. He has been resilient to monitor CBG's as his control has been so  good  In the past.  His last A1c was 7.1% in Sept 2014. [name] Patient denies reactive hypoglycemic symptoms, visual blurring, diabetic polys, or paresthesias.    Finally, patient has history of Vitamin D Deficiency of 30 in 2008 with last vitamin D 93 in Sept 2014.    Medication List    aspirin 81 MG tablet  Take 81 mg by mouth daily.     atenolol 100 MG tablet  Commonly known as:  TENORMIN  Take 100 mg by mouth daily.     azithromycin 250 MG tablet  Commonly known as:  ZITHROMAX  Take 2 tablets (500 mg) on  Day 1,  followed by 1 tablet (250 mg) once daily on Days 2 through 5.     ferrous sulfate 325 (65 FE) MG EC tablet  Take 325 mg by mouth 3 (three) times daily with meals.     finasteride 5 MG tablet   Commonly known as:  PROSCAR  Take 5 mg by mouth daily.     glyBURIDE 5 MG tablet  Commonly known as:  DIABETA  Take 5 mg by mouth daily with breakfast.     hydrochlorothiazide 25 MG tablet  Commonly known as:  HYDRODIURIL  Take 25 mg by mouth daily.     HYDROcodone-acetaminophen 5-325 MG per tablet  Commonly known as:  NORCO  1/2 to 1 tablet every 3 to 4 hours as need for cough or pain     levothyroxine 50 MCG tablet  Commonly known as:  SYNTHROID, LEVOTHROID  Take 50 mcg by mouth daily before breakfast.     Magnesium 250 MG Tabs  Take 250 mg by mouth daily.     metFORMIN 500 MG tablet  Commonly known as:  GLUCOPHAGE  Take 500 mg by mouth 4 (four) times daily.     multivitamin capsule  Take 1 capsule by mouth daily.     potassium chloride 10 MEQ CR tablet  Commonly known as:  KLOR-CON  Take 10 mEq by mouth daily.     predniSONE 20 MG tablet  Commonly known as:  DELTASONE  1 tab 3 x day for 2 days, then 1 tab 2 x day for 2 days, then 1 tab 1 x day for 3 days  quinapril 40 MG tablet  Commonly known as:  ACCUPRIL  Take 40 mg by mouth at bedtime.     simvastatin 40 MG tablet  Commonly known as:  ZOCOR  Take 1 tablet at bedtime  or as directed for cholesterol     vitamin C 500 MG tablet  Commonly known as:  ASCORBIC ACID  Take 500 mg by mouth daily.     VITAMIN D PO  Take 5,000 Units by mouth daily.        Allergies  Allergen Reactions  . Penicillins     Past Medical History  Diagnosis Date  . Diabetes mellitus   . Hypertension   . Hyperlipidemia   . Atrial fibrillation   . Type II or unspecified type diabetes mellitus without mention of complication, not stated as uncontrolled   . BPH (benign prostatic hyperplasia)   . Other testicular hypofunction     Past Surgical History  Procedure Laterality Date  . Inguinal hernia repair      1968 and 2004    Family History  Problem Relation Age of Onset  . Colon cancer Neg Hx   . Sickle cell  anemia Mother   . Diabetes Father   . Stomach cancer Father   . CAD Neg Hx     History   Social History  . Marital Status: Married    Spouse Name: N/A    Number of Children: 2  . Years of Education: N/A   Occupational History  . Retired-Director of [employer]    Social History Main Topics  . Smoking status: Never Smoker   . Smokeless tobacco: Never Used  . Alcohol Use: No  . Drug Use: No  . Sexual Activity: Not on file    ROS Constitutional: Denies fever, chills, weight loss/gain, headaches, insomnia, fatigue, night sweats, and change in appetite. Eyes: Denies redness, blurred vision, diplopia, discharge, itchy, watery eyes.  ENT: Denies discharge, congestion, post nasal drip, epistaxis, sore throat, earache, hearing loss, dental pain, Tinnitus, Vertigo, Sinus pain, snoring.  Cardio: Denies chest pain, palpitations, irregular heartbeat, syncope, dyspnea, diaphoresis, orthopnea, PND, claudication, edema Respiratory: denies cough, dyspnea, DOE, pleurisy, hoarseness, laryngitis, wheezing.  Gastrointestinal: Denies dysphagia, heartburn, reflux, water brash, pain, cramps, nausea, vomiting, bloating, diarrhea, constipation, hematemesis, melena, hematochezia, jaundice, hemorrhoids Genitourinary: Denies dysuria, frequency, urgency, nocturia, hesitancy, discharge, hematuria, flank pain Musculoskeletal: Denies arthralgia, myalgia, stiffness, Jt. Swelling, pain, limp, and strain/sprain. Skin: Denies puritis, rash, hives, warts, acne, eczema, changing in skin lesion Neuro: No weakness, tremor, incoordination, spasms, paresthesia, pain Psychiatric: Denies confusion, memory loss, sensory loss Endocrine: Denies change in weight, skin, hair change, nocturia, and paresthesia, diabetic polys, visual blurring, hyper / hypo glycemic episodes.  Heme/Lymph: No excessive bleeding, bruising, or elarged lymph nodes.  BP: 110/76  Pulse: 72  Temp: 97.5 F (36.4 C)  Resp: 18    Estimated body  mass index is 26.22 kg/(m^2) as calculated from the following:   Height as of this encounter: 5' 11.5" (1.816 m).   Weight as of this encounter: 190 lb 9.6 oz (86.456 kg).  Physical Exam General Appearance: Well nourished, in no apparent distress. Sl hoarse with Brassy cough. Eyes: PERRLA, EOMs, conjunctiva no swelling or erythema, normal fundi and vessels. Sinuses: No frontal/maxillary tenderness ENT/Mouth: EACs patent / TMs  nl. Nares clear without erythema, swelling, mucoid exudates. Oral hygiene is good. No erythema, swelling, or exudate. Tongue normal, non-obstructing. Tonsils not swollen or erythematous. Hearing normal.  Neck: Supple, thyroid normal. No bruits, nodes  or JVD. Respiratory: Respiratory effort normal.  BS equal and few scotter coarse ralse but no rhonci, wheezing or stridor. Cardio: Heart sounds are normal with regular rate and rhythm and no murmurs, rubs or gallops. Peripheral pulses are normal and equal bilaterally without edema. No aortic or femoral bruits. Chest: symmetric with normal excursions and percussion.  Abdomen: Flat, soft, with bowl sounds. Nontender, no guarding, rebound, hernias, masses, or organomegaly.  Lymphatics: Non tender without lymphadenopathy.  Genitourinary: No hernias.Testes nl. DRE - prostate nl for age - smooth & firm w/o nodules. Musculoskeletal: Full ROM all peripheral extremities, joint stability, 5/5 strength, and normal gait. Skin: Warm and dry without rashes, lesions, cyanosis, clubbing or  ecchymosis.  Neuro: Cranial nerves intact, reflexes equal bilaterally. Normal muscle tone, no cerebellar symptoms. Sensation intact.  Pysch: Awake and oriented X 3, normal affect, insight and judgment appropriate.   Assessment and Plan  1. Annual Screening Examination 2. Hypertension  3. Hyperlipidemia 4. T2 NIDDM 5. Vitamin D Deficiency 6. MGUS 7. BPH 8. pAfib 9. Tracheitis  Continue prudent diet as discussed, weight control, BP monitoring,  regular exercise, and medications as discussed.  Discussed med effects and SE's. Routine screening labs and tests as requested with regular follow-up as recommended. Rx Zpak, Pred taper & hydrocodone  For Tracheitis

## 2014-01-03 NOTE — Patient Instructions (Addendum)
Decrease Simvastatin 40 mg to 1/2 tablet every other day  (even days)   Hypertension As your heart beats, it forces blood through your arteries. This force is your blood pressure. If the pressure is too high, it is called hypertension (HTN) or high blood pressure. HTN is dangerous because you may have it and not know it. High blood pressure may mean that your heart has to work harder to pump blood. Your arteries may be narrow or stiff. The extra work puts you at risk for heart disease, stroke, and other problems.  Blood pressure consists of two numbers, a higher number over a lower, 110/72, for example. It is stated as "110 over 72." The ideal is below 120 for the top number (systolic) and under 80 for the bottom (diastolic). Write down your blood pressure today. You should pay close attention to your blood pressure if you have certain conditions such as:  Heart failure.  Prior heart attack.  Diabetes  Chronic kidney disease.  Prior stroke.  Multiple risk factors for heart disease. To see if you have HTN, your blood pressure should be measured while you are seated with your arm held at the level of the heart. It should be measured at least twice. A one-time elevated blood pressure reading (especially in the Emergency Department) does not mean that you need treatment. There may be conditions in which the blood pressure is different between your right and left arms. It is important to see your caregiver soon for a recheck. Most people have essential hypertension which means that there is not a specific cause. This type of high blood pressure may be lowered by changing lifestyle factors such as:  Stress.  Smoking.  Lack of exercise.  Excessive weight.  Drug/tobacco/alcohol use.  Eating less salt. Most people do not have symptoms from high blood pressure until it has caused damage to the body. Effective treatment can often prevent, delay or reduce that damage. TREATMENT  When a  cause has been identified, treatment for high blood pressure is directed at the cause. There are a large number of medications to treat HTN. These fall into several categories, and your caregiver will help you select the medicines that are best for you. Medications may have side effects. You should review side effects with your caregiver. If your blood pressure stays high after you have made lifestyle changes or started on medicines,   Your medication(s) may need to be changed.  Other problems may need to be addressed.  Be certain you understand your prescriptions, and know how and when to take your medicine.  Be sure to follow up with your caregiver within the time frame advised (usually within two weeks) to have your blood pressure rechecked and to review your medications.  If you are taking more than one medicine to lower your blood pressure, make sure you know how and at what times they should be taken. Taking two medicines at the same time can result in blood pressure that is too low. SEEK IMMEDIATE MEDICAL CARE IF:  You develop a severe headache, blurred or changing vision, or confusion.  You have unusual weakness or numbness, or a faint feeling.  You have severe chest or abdominal pain, vomiting, or breathing problems. MAKE SURE YOU:   Understand these instructions.  Will watch your condition.  Will get help right away if you are not doing well or get worse.   Diabetes and Exercise Exercising regularly is important. It is not just about losing  weight. It has many health benefits, such as:  Improving your overall fitness, flexibility, and endurance.  Increasing your bone density.  Helping with weight control.  Decreasing your body fat.  Increasing your muscle strength.  Reducing stress and tension.  Improving your overall health. People with diabetes who exercise gain additional benefits because exercise:  Reduces appetite.  Improves the body's use of blood sugar  (glucose).  Helps lower or control blood glucose.  Decreases blood pressure.  Helps control blood lipids (such as cholesterol and triglycerides).  Improves the body's use of the hormone insulin by:  Increasing the body's insulin sensitivity.  Reducing the body's insulin needs.  Decreases the risk for heart disease because exercising:  Lowers cholesterol and triglycerides levels.  Increases the levels of good cholesterol (such as high-density lipoproteins [HDL]) in the body.  Lowers blood glucose levels. YOUR ACTIVITY PLAN  Choose an activity that you enjoy and set realistic goals. Your health care provider or diabetes educator can help you make an activity plan that works for you. You can break activities into 2 or 3 sessions throughout the day. Doing so is as good as one long session. Exercise ideas include:  Taking the dog for a walk.  Taking the stairs instead of the elevator.  Dancing to your favorite song.  Doing your favorite exercise with a friend. RECOMMENDATIONS FOR EXERCISING WITH TYPE 1 OR TYPE 2 DIABETES   Check your blood glucose before exercising. If blood glucose levels are greater than 240 mg/dL, check for urine ketones. Do not exercise if ketones are present.  Avoid injecting insulin into areas of the body that are going to be exercised. For example, avoid injecting insulin into:  The arms when playing tennis.  The legs when jogging.  Keep a record of:  Food intake before and after you exercise.  Expected peak times of insulin action.  Blood glucose levels before and after you exercise.  The type and amount of exercise you have done.  Review your records with your health care provider. Your health care provider will help you to develop guidelines for adjusting food intake and insulin amounts before and after exercising.  If you take insulin or oral hypoglycemic agents, watch for signs and symptoms of hypoglycemia. They  include:  Dizziness.  Shaking.  Sweating.  Chills.  Confusion.  Drink plenty of water while you exercise to prevent dehydration or heat stroke. Body water is lost during exercise and must be replaced.  Talk to your health care provider before starting an exercise program to make sure it is safe for you. Remember, almost any type of activity is better than none.    Cholesterol Cholesterol is a white, waxy, fat-like protein needed by your body in small amounts. The liver makes all the cholesterol you need. It is carried from the liver by the blood through the blood vessels. Deposits (plaque) may build up on blood vessel walls. This makes the arteries narrower and stiffer. Plaque increases the risk for heart attack and stroke. You cannot feel your cholesterol level even if it is very high. The only way to know is by a blood test to check your lipid (fats) levels. Once you know your cholesterol levels, you should keep a record of the test results. Work with your caregiver to to keep your levels in the desired range. WHAT THE RESULTS MEAN:  Total cholesterol is a rough measure of all the cholesterol in your blood.  LDL is the so-called bad cholesterol. This  is the type that deposits cholesterol in the walls of the arteries. You want this level to be low.  HDL is the good cholesterol because it cleans the arteries and carries the LDL away. You want this level to be high.  Triglycerides are fat that the body can either burn for energy or store. High levels are closely linked to heart disease. DESIRED LEVELS:  Total cholesterol below 200.  LDL below 100 for people at risk, below 70 for very high risk.  HDL above 50 is good, above 60 is best.  Triglycerides below 150. HOW TO LOWER YOUR CHOLESTEROL:  Diet.  Choose fish or white meat chicken and [place], roasted or baked. Limit fatty cuts of red meat, fried foods, and processed meats, such as sausage and lunch meat.  Eat lots of  fresh fruits and vegetables. Choose whole grains, beans, pasta, potatoes and cereals.  Use only small amounts of olive, corn or canola oils. Avoid butter, mayonnaise, shortening or palm kernel oils. Avoid foods with trans-fats.  Use skim/nonfat milk and low-fat/nonfat yogurt and cheeses. Avoid whole milk, cream, ice cream, egg yolks and cheeses. Healthy desserts include angel food cake, ginger snaps, animal crackers, hard candy, popsicles, and low-fat/nonfat frozen yogurt. Avoid pastries, cakes, pies and cookies.  Exercise.  A regular program helps decrease LDL and raises HDL.  Helps with weight control.  Do things that increase your activity level like gardening, walking, or taking the stairs.  Medication.  May be prescribed by your caregiver to help lowering cholesterol and the risk for heart disease.  You may need medicine even if your levels are normal if you have several risk factors. HOME CARE INSTRUCTIONS   Follow your diet and exercise programs as suggested by your caregiver.  Take medications as directed.  Have blood work done when your caregiver feels it is necessary. MAKE SURE YOU:   Understand these instructions.  Will watch your condition.  Will get help right away if you are not doing well or get worse.      Vitamin D Deficiency Vitamin D is an important vitamin that your body needs. Having too little of it in your body is called a deficiency. A very bad deficiency can make your bones soft and can cause a condition called rickets.  Vitamin D is important to your body for different reasons, such as:   It helps your body absorb 2 minerals called calcium and phosphorus.  It helps make your bones healthy.  It may prevent some diseases, such as diabetes and multiple sclerosis.  It helps your muscles and heart. You can get vitamin D in several ways. It is a natural part of some foods. The vitamin is also added to some dairy products and cereals. Some people  take vitamin D supplements. Also, your body makes vitamin D when you are in the sun. It changes the sun's rays into a form of the vitamin that your body can use. CAUSES   Not eating enough foods that contain vitamin D.  Not getting enough sunlight.  Having certain digestive system diseases that make it hard to absorb vitamin D. These diseases include Crohn's disease, chronic pancreatitis, and cystic fibrosis.  Having a surgery in which part of the stomach or small intestine is removed.  Being obese. Fat cells pull vitamin D out of your blood. That means that obese people may not have enough vitamin D left in their blood and in other body tissues.  Having chronic kidney or liver disease.  RISK FACTORS Risk factors are things that make you more likely to develop a vitamin D deficiency. They include:  Being older.  Not being able to get outside very much.  Living in a nursing home.  Having had broken bones.  Having weak or thin bones (osteoporosis).  Having a disease or condition that changes how your body absorbs vitamin D.  Having dark skin.  Some medicines such as seizure medicines or steroids.  Being overweight or obese. SYMPTOMS Mild cases of vitamin D deficiency may not have any symptoms. If you have a very bad case, symptoms may include:  Bone pain.  Muscle pain.  Falling often.  Broken bones caused by a minor injury, due to osteoporosis. DIAGNOSIS A blood test is the best way to tell if you have a vitamin D deficiency. TREATMENT Vitamin D deficiency can be treated in different ways. Treatment for vitamin D deficiency depends on what is causing it. Options include:  Taking vitamin D supplements.  Taking a calcium supplement. Your caregiver will suggest what dose is best for you. HOME CARE INSTRUCTIONS  Take any supplements that your caregiver prescribes. Follow the directions carefully. Take only the suggested amount.  Have your blood tested 2 months after  you start taking supplements.  Eat foods that contain vitamin D. Healthy choices include:  Fortified dairy products, cereals, or juices. Fortified means vitamin D has been added to the food. Check the label on the package to be sure.  Fatty fish like salmon or trout.  Eggs.  Oysters.  Do not use a tanning bed.  Keep your weight at a healthy level. Lose weight if you need to.  Keep all follow-up appointments. Your caregiver will need to perform blood tests to make sure your vitamin D deficiency is going away. SEEK MEDICAL CARE IF:  You have any questions about your treatment.  You continue to have symptoms of vitamin D deficiency.  You have nausea or vomiting.  You are constipated.  You feel confused.  You have severe abdominal or back pain. MAKE SURE YOU:  Understand these instructions.  Will watch your condition.  Will get help right away if you are not doing well or get worse.

## 2014-01-04 LAB — BASIC METABOLIC PANEL WITH GFR
BUN: 20 mg/dL (ref 6–23)
CALCIUM: 9 mg/dL (ref 8.4–10.5)
CO2: 30 meq/L (ref 19–32)
Chloride: 103 mEq/L (ref 96–112)
Creat: 1.23 mg/dL (ref 0.50–1.35)
GFR, Est African American: 67 mL/min
GFR, Est Non African American: 58 mL/min — ABNORMAL LOW
GLUCOSE: 104 mg/dL — AB (ref 70–99)
POTASSIUM: 4.2 meq/L (ref 3.5–5.3)
SODIUM: 141 meq/L (ref 135–145)

## 2014-01-04 LAB — URINALYSIS, MICROSCOPIC ONLY
BACTERIA UA: NONE SEEN
CASTS: NONE SEEN
CRYSTALS: NONE SEEN
SQUAMOUS EPITHELIAL / LPF: NONE SEEN

## 2014-01-04 LAB — LIPID PANEL
CHOLESTEROL: 106 mg/dL (ref 0–200)
HDL: 39 mg/dL — ABNORMAL LOW (ref >39)
LDL Cholesterol: 42 mg/dL (ref 0–99)
TRIGLYCERIDES: 124 mg/dL (ref <150)
Total CHOL/HDL Ratio: 2.7 Ratio
VLDL: 25 mg/dL (ref 0–40)

## 2014-01-04 LAB — HEPATIC FUNCTION PANEL
ALT: 18 U/L (ref 0–53)
AST: 21 U/L (ref 0–37)
Albumin: 3.5 g/dL (ref 3.5–5.2)
Alkaline Phosphatase: 66 U/L (ref 39–117)
BILIRUBIN DIRECT: 0.1 mg/dL (ref 0.0–0.3)
BILIRUBIN INDIRECT: 0.4 mg/dL (ref 0.2–1.2)
BILIRUBIN TOTAL: 0.5 mg/dL (ref 0.2–1.2)
Total Protein: 5.7 g/dL — ABNORMAL LOW (ref 6.0–8.3)

## 2014-01-04 LAB — MICROALBUMIN / CREATININE URINE RATIO
Creatinine, Urine: 97.5 mg/dL
MICROALB/CREAT RATIO: 56.4 mg/g — AB (ref 0.0–30.0)
Microalb, Ur: 5.5 mg/dL — ABNORMAL HIGH (ref 0.00–1.89)

## 2014-01-04 LAB — VITAMIN D 25 HYDROXY (VIT D DEFICIENCY, FRACTURES): Vit D, 25-Hydroxy: 98 ng/mL — ABNORMAL HIGH (ref 30–89)

## 2014-01-04 LAB — MAGNESIUM: Magnesium: 1.9 mg/dL (ref 1.5–2.5)

## 2014-01-04 LAB — PSA: PSA: 0.28 ng/mL (ref <=4.00)

## 2014-01-04 LAB — INSULIN, FASTING: INSULIN FASTING, SERUM: 19 u[IU]/mL (ref 3–28)

## 2014-01-04 LAB — TSH: TSH: 0.355 u[IU]/mL (ref 0.350–4.500)

## 2014-01-06 ENCOUNTER — Other Ambulatory Visit: Payer: Self-pay

## 2014-01-06 DIAGNOSIS — D472 Monoclonal gammopathy: Secondary | ICD-10-CM

## 2014-01-06 MED ORDER — ATENOLOL 100 MG PO TABS
100.0000 mg | ORAL_TABLET | Freq: Every day | ORAL | Status: DC
Start: 2014-01-06 — End: 2014-01-06

## 2014-01-06 MED ORDER — ATENOLOL 100 MG PO TABS: 100.0000 mg | ORAL_TABLET | Freq: Every day | ORAL | Status: DC

## 2014-01-17 ENCOUNTER — Other Ambulatory Visit: Payer: Self-pay

## 2014-01-17 DIAGNOSIS — D472 Monoclonal gammopathy: Secondary | ICD-10-CM

## 2014-01-17 MED ORDER — ATENOLOL 100 MG PO TABS
100.0000 mg | ORAL_TABLET | Freq: Every day | ORAL | Status: DC
Start: 2014-01-17 — End: 2015-03-30

## 2014-01-24 ENCOUNTER — Other Ambulatory Visit (INDEPENDENT_AMBULATORY_CARE_PROVIDER_SITE_OTHER): Payer: Medicare Other

## 2014-01-24 DIAGNOSIS — Z1212 Encounter for screening for malignant neoplasm of rectum: Secondary | ICD-10-CM

## 2014-01-24 LAB — POC HEMOCCULT BLD/STL (HOME/3-CARD/SCREEN)
FECAL OCCULT BLD: NEGATIVE
FECAL OCCULT BLD: NEGATIVE
Fecal Occult Blood, POC: NEGATIVE

## 2014-01-30 ENCOUNTER — Encounter: Payer: Self-pay | Admitting: Physician Assistant

## 2014-01-30 ENCOUNTER — Ambulatory Visit (INDEPENDENT_AMBULATORY_CARE_PROVIDER_SITE_OTHER): Payer: Medicare Other | Admitting: Physician Assistant

## 2014-01-30 VITALS — BP 102/62 | HR 56 | Temp 97.9°F | Resp 16 | Wt 197.0 lb

## 2014-01-30 DIAGNOSIS — M26609 Unspecified temporomandibular joint disorder, unspecified side: Secondary | ICD-10-CM

## 2014-01-30 MED ORDER — CYCLOBENZAPRINE HCL 10 MG PO TABS: 10.0000 mg | ORAL_TABLET | Freq: Every evening | ORAL | Status: AC | PRN

## 2014-01-30 NOTE — Patient Instructions (Signed)
What is the TMJ? The temporomandibular (tem-PUH-ro-man-DIB-yoo-ler) joint, or the TMJ, connects the upper and lower jawbones. This joint allows the jaw to open wide and move back and forth when you chew, talk, or yawn.There are also several muscles that help this joint move. There can be muscle tightness and pain in the muscle that can cause several symptoms.  What causes TMJ pain? There are many causes of TMJ pain. Repeated chewing (for example, chewing gum) and clenching your teeth can cause pain in the joint. Some TMJ pain has no obvious cause. What can I do to ease the pain? There are many things you can do to help your pain get better. When you have pain:  Eat soft foods and stay away from chewy foods (for example, taffy) Try to use both sides of your mouth to chew Don't chew gum Don't open your mouth wide (for example, during yawning or singing) Don't bite your cheeks or fingernails Lower your amount of stress and worry Applying a warm, damp washcloth to the joint may help. Over-the-counter pain medicines such as ibuprofen (one brand: Advil) or acetaminophen (one brand: Tylenol) might also help. Do not use these medicines if you are allergic to them or if your doctor told you not to use them. How can I stop the pain from coming back? When your pain is better, you can do these exercises to make your muscles stronger and to keep the pain from coming back:  Resisted mouth opening: Place your thumb or two fingers under your chin and open your mouth slowly, pushing up lightly on your chin with your thumb. Hold for three to six seconds. Close your mouth slowly. Resisted mouth closing: Place your thumbs under your chin and your two index fingers on the ridge between your mouth and the bottom of your chin. Push down lightly on your chin as you close your mouth. Tongue up: Slowly open and close your mouth while keeping the tongue touching the roof of the mouth. Side-to-side jaw movement: Place an  object about one fourth of an inch thick (for example, two tongue depressors) between your front teeth. Slowly move your jaw from side to side. Increase the thickness of the object as the exercise becomes easier Forward jaw movement: Place an object about one fourth of an inch thick between your front teeth and move the bottom jaw forward so that the bottom teeth are in front of the top teeth. Increase the thickness of the object as the exercise becomes easier. These exercises should not be painful. If it hurts to do these exercises, stop doing them and talk to your family doctor.    

## 2014-01-30 NOTE — Progress Notes (Signed)
   Subjective:    Patient ID: Earl [name], male    DOB: Jul 14, 1941, 73 y.o.   MRN: [id number]  Otalgia  There is pain in the right ear. This is a new problem. Episode onset: 1 week. Progression since onset: worse with opening jaw. There has been no fever. Associated symptoms include headaches (occipital headache) and neck pain. Pertinent negatives include no abdominal pain, coughing, diarrhea, drainage, ear discharge, hearing loss, rash, rhinorrhea, sore throat or vomiting. Treatments tried: claritin.    Review of Systems  Constitutional: Negative.   HENT: Positive for ear pain. Negative for ear discharge, facial swelling, hearing loss, postnasal drip, rhinorrhea, sneezing, sore throat, tinnitus, trouble swallowing and voice change.   Respiratory: Negative.  Negative for cough.   Cardiovascular: Negative.   Gastrointestinal: Negative.  Negative for vomiting, abdominal pain and diarrhea.  Genitourinary: Negative.   Musculoskeletal: Positive for neck pain.  Skin: Negative for rash.  Neurological: Positive for headaches (occipital headache).       Objective:   Physical Exam  Constitutional: He is oriented to person, place, and time. He appears well-developed and well-nourished.  HENT:  Head: Normocephalic and atraumatic.  Right Ear: External ear normal.  Left Ear: External ear normal.  Mouth/Throat: Oropharynx is clear and moist.  TMJ tenderness  Eyes: Conjunctivae and EOM are normal. Pupils are equal, round, and reactive to light.  Neck: Normal range of motion. Neck supple.  Cardiovascular: Normal rate, regular rhythm and normal heart sounds.   Pulmonary/Chest: Effort normal and breath sounds normal.  Abdominal: Soft. Bowel sounds are normal.  Musculoskeletal: Normal range of motion.  Neurological: He is alert and oriented to person, place, and time. No cranial nerve deficit.  Skin: Skin is warm and dry.  Psychiatric: He has a normal mood and affect. His behavior is normal.        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   If any dizziness, changes in vision, fever, changes in speech go to ER.

## 2014-04-07 ENCOUNTER — Encounter: Payer: Self-pay | Admitting: Physician Assistant

## 2014-04-07 ENCOUNTER — Ambulatory Visit (INDEPENDENT_AMBULATORY_CARE_PROVIDER_SITE_OTHER): Payer: Medicare Other | Admitting: Physician Assistant

## 2014-04-07 VITALS — BP 120/68 | HR 56 | Temp 97.7°F | Resp 16 | Ht 71.0 in | Wt 194.0 lb

## 2014-04-07 DIAGNOSIS — Z789 Other specified health status: Secondary | ICD-10-CM

## 2014-04-07 DIAGNOSIS — E559 Vitamin D deficiency, unspecified: Secondary | ICD-10-CM

## 2014-04-07 DIAGNOSIS — E291 Testicular hypofunction: Secondary | ICD-10-CM

## 2014-04-07 DIAGNOSIS — E7800 Pure hypercholesterolemia, unspecified: Secondary | ICD-10-CM

## 2014-04-07 DIAGNOSIS — E119 Type 2 diabetes mellitus without complications: Secondary | ICD-10-CM

## 2014-04-07 DIAGNOSIS — N400 Enlarged prostate without lower urinary tract symptoms: Secondary | ICD-10-CM

## 2014-04-07 DIAGNOSIS — I10 Essential (primary) hypertension: Secondary | ICD-10-CM

## 2014-04-07 DIAGNOSIS — I4891 Unspecified atrial fibrillation: Secondary | ICD-10-CM

## 2014-04-07 DIAGNOSIS — D472 Monoclonal gammopathy: Secondary | ICD-10-CM

## 2014-04-07 DIAGNOSIS — Z79899 Other long term (current) drug therapy: Secondary | ICD-10-CM

## 2014-04-07 DIAGNOSIS — Z0000 Encounter for general adult medical examination without abnormal findings: Secondary | ICD-10-CM

## 2014-04-07 DIAGNOSIS — E538 Deficiency of other specified B group vitamins: Secondary | ICD-10-CM

## 2014-04-07 DIAGNOSIS — Z1331 Encounter for screening for depression: Secondary | ICD-10-CM

## 2014-04-07 LAB — CBC WITH DIFFERENTIAL/PLATELET
Basophils Absolute: 0.1 10*3/uL (ref 0.0–0.1)
Basophils Relative: 1 % (ref 0–1)
EOS ABS: 0.3 10*3/uL (ref 0.0–0.7)
Eosinophils Relative: 5 % (ref 0–5)
HCT: 38.2 % — ABNORMAL LOW (ref 39.0–52.0)
HEMOGLOBIN: 13.4 g/dL (ref 13.0–17.0)
LYMPHS ABS: 2.2 10*3/uL (ref 0.7–4.0)
LYMPHS PCT: 42 % (ref 12–46)
MCH: 29.4 pg (ref 26.0–34.0)
MCHC: 35.1 g/dL (ref 30.0–36.0)
MCV: 83.8 fL (ref 78.0–100.0)
Monocytes Absolute: 0.5 10*3/uL (ref 0.1–1.0)
Monocytes Relative: 9 % (ref 3–12)
NEUTROS ABS: 2.2 10*3/uL (ref 1.7–7.7)
NEUTROS PCT: 43 % (ref 43–77)
PLATELETS: 186 10*3/uL (ref 150–400)
RBC: 4.56 MIL/uL (ref 4.22–5.81)
RDW: 13.8 % (ref 11.5–15.5)
WBC: 5.2 10*3/uL (ref 4.0–10.5)

## 2014-04-07 LAB — HEMOGLOBIN A1C
Hgb A1c MFr Bld: 7 % — ABNORMAL HIGH (ref <5.7)
Mean Plasma Glucose: 154 mg/dL — ABNORMAL HIGH (ref <117)

## 2014-04-07 MED ORDER — GABAPENTIN 300 MG PO CAPS: ORAL_CAPSULE | ORAL | Status: DC

## 2014-04-07 NOTE — Progress Notes (Signed)
Subjective:  Earl [name] is a 73 y.o. male who presents for Medicare Annual Wellness Visit and 3 month follow up for HTN, hyperlipidemia, diabetes, and vitamin D Def.  Date of last medicare wellness visit was is unknown.  His blood pressure has been controlled at home, today their BP is BP: 120/68 mmHg He does workout. He denies chest pain, shortness of breath, dizziness.  He is on cholesterol medication and denies myalgias. His cholesterol is at goal. The cholesterol last visit was:   Lab Results  Component Value Date   CHOL 106 01/03/2014   HDL 39* 01/03/2014   LDLCALC 42 01/03/2014   TRIG 124 01/03/2014   CHOLHDL 2.7 01/03/2014   He has been working on diet and exercise for diabetes, and denies nausea, paresthesia of the feet and polydipsia. Last A1C in the office was:  Lab Results  Component Value Date   HGBA1C 7.4* 01/03/2014   Patient is on Vitamin D supplement.   Has bilateral hand numbness, intermittent, worse at night. Worse radial side.  TMJ/neck pain better, some decreased ROM of neck . Had thyroid goiter with negative biopsy on 04/2013, on thyroid medication right now.   Names of Other Physician/Practitioners you currently use: 1.  [facility] here for primary care 2. Dr. Delman Cheadle, eye doctor, last visit Oct 2014, yearly 3. Dr. Allen Kell, dentist, last visit 4 months ago and in June Patient Care Team: [name], MD as PCP - General (Internal Medicine)  Medication Review: Current Outpatient Prescriptions on File Prior to Visit  Medication Sig Dispense Refill  . aspirin 81 MG tablet Take 81 mg by mouth daily.      [name] atenolol (TENORMIN) 100 MG tablet Take 1 tablet (100 mg total) by mouth daily.  90 tablet  11  . Cholecalciferol (VITAMIN D PO) Take 5,000 Units by mouth daily.      . cyclobenzaprine (FLEXERIL) 10 MG tablet Take 1 tablet (10 mg total) by mouth at bedtime as needed for muscle spasms (for jaw pain).  60 tablet  1  .  ferrous sulfate 325 (65 FE) MG EC tablet Take 325 mg by mouth 3 (three) times daily with meals.        . finasteride (PROSCAR) 5 MG tablet Take 5 mg by mouth daily.        [name] glyBURIDE (DIABETA) 5 MG tablet Take 5 mg by mouth daily with breakfast.        . hydrochlorothiazide (HYDRODIURIL) 25 MG tablet Take 25 mg by mouth daily.        [name] HYDROcodone-acetaminophen (NORCO) 5-325 MG per tablet 1/2 to 1 tablet every 3 to 4 hours as need for cough or pain  50 tablet  0  . levothyroxine (SYNTHROID, LEVOTHROID) 50 MCG tablet Take 50 mcg by mouth daily before breakfast.      . Magnesium 250 MG TABS Take 250 mg by mouth daily.      . metFORMIN (GLUCOPHAGE) 500 MG tablet Take 500 mg by mouth 4 (four) times daily.       . Multiple Vitamin (MULTIVITAMIN) capsule Take 1 capsule by mouth daily.        . potassium chloride (KLOR-CON) 10 MEQ CR tablet Take 10 mEq by mouth daily.        . predniSONE (DELTASONE) 20 MG tablet 1 tab 3 x day for 2 days, then 1 tab 2 x day for 2 days, then 1 tab 1 x day for 3 days  13  tablet  0  . quinapril (ACCUPRIL) 40 MG tablet Take 40 mg by mouth at bedtime.        . simvastatin (ZOCOR) 40 MG tablet Take 1 tablet at bedtime  or as directed for cholesterol  30 tablet  99  . vitamin C (ASCORBIC ACID) 500 MG tablet Take 500 mg by mouth daily.         No current facility-administered medications on file prior to visit.    Current Problems (verified) Patient Active Problem List   Diagnosis Date Noted  . Encounter for long-term (current) use of other medications 01/03/2014  . T2 NIDDM   . BPH (benign prostatic hyperplasia)   . Testosterone Deficiency   . 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 Anemia, unspecified 11/07/2013  . Hypertension 11/07/2013  . Hypercholesteremia 11/07/2013  . Atrial fibrillation 09/18/2012    Screening Tests Health Maintenance  Topic Date Due  . Zostavax  04/28/2001  . Tetanus/tdap  11/21/2013  . Influenza Vaccine   06/21/2014  . Colonoscopy  09/07/2017  . Pneumococcal Polysaccharide Vaccine Age 64 And Over  Completed    Immunization History  Administered Date(s) Administered  . Influenza Split 08/07/2013  . Pneumococcal Polysaccharide-23 08/13/2008  . Td 11/22/2003   Preventative care: Last colonoscopy: 08/2012 due 5 years   Prior vaccinations: TD or Tdap: 2005 Influenza: 2014 Pneumococcal: 2009 Shingles/Zostavax: declines  History reviewed: allergies, current medications, past family history, past medical history, past social history, past surgical history and problem list   Risk Factors: Tobacco History  Substance Use Topics  . Smoking status: Never Smoker   . Smokeless tobacco: Never Used  . Alcohol Use: No   He does not smoke.  Patient is not a former smoker. Are there smokers in your home (other than you)?  No  Alcohol Current alcohol use: none  Caffeine Current caffeine use: coffee 1 /day  Exercise Current exercise: walking and yard work  Nutrition/Diet Current diet: in general, a "healthy" diet    Cardiac risk factors: advanced age (older than 71 for men, 20 for women), diabetes mellitus, dyslipidemia, hypertension, male gender and sedentary lifestyle.  Depression Screen (Note: if answer to either of the following is "Yes", a more complete depression screening is indicated)   Q1: Over the past two weeks, have you felt down, depressed or hopeless? No  Q2: Over the past two weeks, have you felt little interest or pleasure in doing things? No  Have you lost interest or pleasure in daily life? No  Do you often feel hopeless? No  Do you cry easily over simple problems? No  Activities of Daily Living In your present state of health, do you have any difficulty performing the following activities?:  Driving? No Managing money?  No Feeding yourself? No Getting from bed to chair? No Climbing a flight of stairs? No Preparing food and eating?: No Bathing or showering?  No Getting dressed: No Getting to the toilet? No Using the toilet:No Moving around from place to place: No In the past year have you fallen or had a near fall?:No   Are you sexually active?  No  Do you have more than one partner?  No  Vision Difficulties: No  Hearing Difficulties: No Do you often ask people to speak up or repeat themselves? Yes Do you experience ringing or noises in your ears? No Do you have difficulty understanding soft or whispered voices? No  Cognition  Do you feel that you have a problem  with memory? Yes  Do you often misplace items? No  Do you feel safe at home?  Yes  Advanced directives Does patient have a [place]? Yes Does patient have a Living Will? Yes   Objective:   Blood pressure 120/68, pulse 56, temperature 97.7 F (36.5 C), resp. rate 16, height 5\' 11"  (1.803 m), weight 194 lb (87.998 kg). Body mass index is 27.07 kg/(m^2).  General appearance: alert, no distress, WD/WN, male Cognitive Testing  Alert? Yes  Normal Appearance?Yes  Oriented to person? Yes  Place? Yes   Time? Yes  Recall of three objects?  Yes  Can perform simple calculations? Yes  Displays appropriate judgment?Yes  Can read the correct time from a watch face?Yes  HEENT: normocephalic, sclerae anicteric, TMs pearly, nares patent, no discharge or erythema, pharynx normal Oral cavity: MMM, no lesions Neck: supple, no lymphadenopathy, + thyromegaly, no masses Heart: RRR, normal S1, S2, no murmurs Lungs: CTA bilaterally, no wheezes, rhonchi, or rales Abdomen: +bs, soft, non tender, non distended, no masses, no hepatomegaly, no splenomegaly Musculoskeletal: nontender, no swelling, no obvious deformity Extremities: no edema, no cyanosis, no clubbing Pulses: 2+ symmetric, upper and lower extremities, normal cap refill Neurological: alert, oriented x 3, CN2-12 intact, strength normal upper extremities and lower extremities, sensation decrease to ankle  bilateral feet, DTRs 2+ throughout, no cerebellar signs, gait normal Psychiatric: normal affect, behavior normal, pleasant   Assessment:   1. Atrial fibrillation monitor  2. Hypertension - CBC with Differential - BASIC METABOLIC PANEL WITH GFR - Hepatic function panel - TSH  3. T2 NIDDM Discussed general issues about diabetes pathophysiology and management., Educational material distributed., Suggested low cholesterol diet., Encouraged aerobic exercise., Discussed foot care., Reminded to get yearly retinal exam. - Hemoglobin A1c - HM DIABETES FOOT EXAM  4. Testosterone Deficiency Uncertain if he would like to get back on treatment, will consider it  5. BPH (benign prostatic hyperplasia) remission  6. MGUS (monoclonal gammopathy of unknown significance) Check CBC  7. Hypercholesteremia - Lipid panel  8. Encounter for long-term (current) use of other medications - Magnesium  9. Unspecified vitamin D deficiency - Vit D  25 hydroxy (rtn osteoporosis monitoring)  10. Paresthesias hands and feet- ? From cervical neck versus DM- likely neck but patient does not want imaging or referral at this time- can try gabapentin at night and get cervical neck pillow.  - Vitamin B12   Plan:   During the course of the visit the patient was educated and counseled about appropriate screening and preventive services including:    Pneumococcal vaccine   Influenza vaccine  Td vaccine  Screening electrocardiogram  Colorectal cancer screening  Diabetes screening  Glaucoma screening  Nutrition counseling   Screening recommendations, referrals: Vaccinations: Tdap vaccine requested will get next visit Influenza vaccine requested Pneumococcal vaccine not indicated Shingles vaccine declined Hep B vaccine not indicated  Nutrition assessed and recommended  Colonoscopy due 2018 Recommended yearly ophthalmology/optometry visit for glaucoma screening and checkup Recommended yearly  dental visit for hygiene and checkup Advanced directives - requested  Conditions/risks identified: BMI: Discussed weight loss, diet, and increase physical activity.  Increase physical activity: AHA recommends 150 minutes of physical activity a week.  Medications reviewed Diabetes is not at goal, ACE/ARB therapy: Yes. Urinary Incontinence is not an issue: discussed non pharmacology and pharmacology options.  Fall risk: low- discussed PT, home fall assessment, medications.    Medicare Attestation I have personally reviewed: The patient's medical and social history  Their use of alcohol, tobacco or illicit drugs Their current medications and supplements The patient's functional ability including ADLs,fall risks, home safety risks, cognitive, and hearing and visual impairment Diet and physical activities Evidence for depression or mood disorders  The patient's weight, height, BMI, and visual acuity have been recorded in the chart.  I have made referrals, counseling, and provided education to the patient based on review of the above and I have provided the patient with a written personalized care plan for preventive services.     Vicie Mutters, PA-C   04/07/2014

## 2014-04-07 NOTE — Patient Instructions (Signed)
Preventative Care for Adults, Male       REGULAR HEALTH EXAMS:  A routine yearly physical is a good way to check in with your primary care provider about your health and preventive screening. It is also an opportunity to share updates about your health and any concerns you have, and receive a thorough all-over exam.   Most health insurance companies pay for at least some preventative services.  Check with your health plan for specific coverages.  WHAT PREVENTATIVE SERVICES DO MEN NEED?  Adult men should have their weight and blood pressure checked regularly.   Men age 35 and older should have their cholesterol levels checked regularly.  Beginning at age 50 and continuing to age 75, men should be screened for colorectal cancer.  Certain people should may need continued testing until age 85.  Other cancer screening may include exams for testicular and prostate cancer.  Updating vaccinations is part of preventative care.  Vaccinations help protect against diseases such as the flu.  Lab tests are generally done as part of preventative care to screen for anemia and blood disorders, to screen for problems with the kidneys and liver, to screen for bladder problems, to check blood sugar, and to check your cholesterol level.  Preventative services generally include counseling about diet, exercise, avoiding tobacco, drugs, excessive alcohol consumption, and sexually transmitted infections.    GENERAL RECOMMENDATIONS FOR GOOD HEALTH:  Healthy diet:  Eat a variety of foods, including fruit, vegetables, animal or vegetable protein, such as meat, fish, chicken, and eggs, or beans, lentils, tofu, and grains, such as rice.  Drink plenty of water daily.  Decrease saturated fat in the diet, avoid lots of red meat, processed foods, sweets, fast foods, and fried foods.  Exercise:  Aerobic exercise helps maintain good heart health. At least 30-40 minutes of moderate-intensity exercise is recommended.  For example, a brisk walk that increases your heart rate and breathing. This should be done on most days of the week.   Find a type of exercise or a variety of exercises that you enjoy so that it becomes a part of your daily life.  Examples are running, walking, swimming, water aerobics, and biking.  For motivation and support, explore group exercise such as aerobic class, spin class, Zumba, Yoga,or  martial arts, etc.    Set exercise goals for yourself, such as a certain weight goal, walk or run in a race such as a 5k walk/run.  Speak to your primary care provider about exercise goals.  Disease prevention:  If you smoke or chew tobacco, find out from your caregiver how to quit. It can literally save your life, no matter how long you have been a tobacco user. If you do not use tobacco, never begin.   Maintain a healthy diet and normal weight. Increased weight leads to problems with blood pressure and diabetes.   The Body Mass Index or BMI is a way of measuring how much of your body is fat. Having a BMI above 27 increases the risk of heart disease, diabetes, hypertension, stroke and other problems related to obesity. Your caregiver can help determine your BMI and based on it develop an exercise and dietary program to help you achieve or maintain this important measurement at a healthful level.  High blood pressure causes heart and blood vessel problems.  Persistent high blood pressure should be treated with medicine if weight loss and exercise do not work.   Fat and cholesterol leaves deposits in your arteries   that can block them. This causes heart disease and vessel disease elsewhere in your body.  If your cholesterol is found to be high, or if you have heart disease or certain other medical conditions, then you may need to have your cholesterol monitored frequently and be treated with medication.   Ask if you should have a stress test if your history suggests this. A stress test is a test done on  a treadmill that looks for heart disease. This test can find disease prior to there being a problem.  Avoid drinking alcohol in excess (more than two drinks per day).  Avoid use of street drugs. Do not share needles with anyone. Ask for professional help if you need assistance or instructions on stopping the use of alcohol, cigarettes, and/or drugs.  Brush your teeth twice a day with fluoride toothpaste, and floss once a day. Good oral hygiene prevents tooth decay and gum disease. The problems can be painful, unattractive, and can cause other health problems. Visit your dentist for a routine oral and dental check up and preventive care every 6-12 months.   Look at your skin regularly.  Use a mirror to look at your back. Notify your caregivers of changes in moles, especially if there are changes in shapes, colors, a size larger than a pencil eraser, an irregular border, or development of new moles.  Safety:  Use seatbelts 100% of the time, whether driving or as a passenger.  Use safety devices such as hearing protection if you work in environments with loud noise or significant background noise.  Use safety glasses when doing any work that could send debris in to the eyes.  Use a helmet if you ride a bike or motorcycle.  Use appropriate safety gear for contact sports.  Talk to your caregiver about gun safety.  Use sunscreen with a SPF (or skin protection factor) of 15 or greater.  Lighter skinned people are at a greater risk of skin cancer. Don't forget to also wear sunglasses in order to protect your eyes from too much damaging sunlight. Damaging sunlight can accelerate cataract formation.   Practice safe sex. Use condoms. Condoms are used for birth control and to help reduce the spread of sexually transmitted infections (or STIs).  Some of the STIs are gonorrhea (the clap), chlamydia, syphilis, trichomonas, herpes, HPV (human papilloma virus) and HIV (human immunodeficiency virus) which causes AIDS.  The herpes, HIV and HPV are viral illnesses that have no cure. These can result in disability, cancer and death.   Keep carbon monoxide and smoke detectors in your home functioning at all times. Change the batteries every 6 months or use a model that plugs into the wall.   Vaccinations:  Stay up to date with your tetanus shots and other required immunizations. You should have a booster for tetanus every 10 years. Be sure to get your flu shot every year, since 5%-20% of the [place] population comes down with the flu. The flu vaccine changes each year, so being vaccinated once is not enough. Get your shot in the fall, before the flu season peaks.   Other vaccines to consider:  Pneumococcal vaccine to protect against certain types of pneumonia.  This is normally recommended for adults age 65 or older.  However, adults younger than 73 years old with certain underlying conditions such as diabetes, heart or lung disease should also receive the vaccine.  Shingles vaccine to protect against Varicella Zoster if you are older than age 60, or younger   than 73 years old with certain underlying illness.  Hepatitis A vaccine to protect against a form of infection of the liver by a virus acquired from food.  Hepatitis B vaccine to protect against a form of infection of the liver by a virus acquired from blood or body fluids, particularly if you work in health care.  If you plan to travel internationally, check with your local health department for specific vaccination recommendations.  Cancer Screening:  Most routine colon cancer screening begins at the age of 38. On a yearly basis, doctors may provide special easy to use take-home tests to check for hidden blood in the stool. Sigmoidoscopy or colonoscopy can detect the earliest forms of colon cancer and is life saving. These tests use a small camera at the end of a tube to directly examine the colon. Speak to your caregiver about this at age 25, when routine  screening begins (and is repeated every 5 years unless early forms of pre-cancerous polyps or small growths are found).   At the age of 57 men usually start screening for prostate cancer every year. Screening may begin at a younger age for those with higher risk. Those at higher risk include African-Americans or having a family history of prostate cancer. There are two types of tests for prostate cancer:   Prostate-specific antigen (PSA) testing. Recent studies raise questions about prostate cancer using PSA and you should discuss this with your caregiver.   Digital rectal exam (in which your doctor's lubricated and gloved finger feels for enlargement of the prostate through the anus).   Screening for testicular cancer.  Do a monthly exam of your testicles. Gently roll each testicle between your thumb and fingers, feeling for any abnormal lumps. The best time to do this is after a hot shower or bath when the tissues are looser. Notify your caregivgoo.ers of any lumps, tenderness or changes in size or shape immediately.     Bad carbs also include fruit juice, alcohol, and sweet tea. These are empty calories that do not signal to your brain that you are full.   Please remember the good carbs are still carbs which convert into sugar. So please measure them out no more than 1/2-1 cup of rice, oatmeal, pasta, and beans.  Veggies are however free foods! Pile them on.   I like lean protein at every meal such as chicken, [place], pork chops, cottage cheese, etc. Just do not fry these meats and please center your meal around vegetable, the meats should be a side dish.   No all fruit is created equal. Please see the list below, the fruit at the bottom is higher in sugars than the fruit at the top

## 2014-04-08 LAB — BASIC METABOLIC PANEL WITH GFR
BUN: 18 mg/dL (ref 6–23)
CO2: 28 mEq/L (ref 19–32)
Calcium: 9.2 mg/dL (ref 8.4–10.5)
Chloride: 103 mEq/L (ref 96–112)
Creat: 1.13 mg/dL (ref 0.50–1.35)
GFR, EST NON AFRICAN AMERICAN: 65 mL/min
GFR, Est African American: 75 mL/min
Glucose, Bld: 133 mg/dL — ABNORMAL HIGH (ref 70–99)
POTASSIUM: 4.1 meq/L (ref 3.5–5.3)
Sodium: 140 mEq/L (ref 135–145)

## 2014-04-08 LAB — LIPID PANEL
CHOL/HDL RATIO: 2.6 ratio
Cholesterol: 118 mg/dL (ref 0–200)
HDL: 46 mg/dL (ref >39)
LDL Cholesterol: 57 mg/dL (ref 0–99)
Triglycerides: 74 mg/dL (ref <150)
VLDL: 15 mg/dL (ref 0–40)

## 2014-04-08 LAB — TSH: TSH: 0.198 u[IU]/mL — AB (ref 0.350–4.500)

## 2014-04-08 LAB — HEPATIC FUNCTION PANEL
ALBUMIN: 3.9 g/dL (ref 3.5–5.2)
ALT: 16 U/L (ref 0–53)
AST: 18 U/L (ref 0–37)
Alkaline Phosphatase: 60 U/L (ref 39–117)
BILIRUBIN DIRECT: 0.1 mg/dL (ref 0.0–0.3)
Indirect Bilirubin: 0.5 mg/dL (ref 0.2–1.2)
Total Bilirubin: 0.6 mg/dL (ref 0.2–1.2)
Total Protein: 6.1 g/dL (ref 6.0–8.3)

## 2014-04-08 LAB — VITAMIN D 25 HYDROXY (VIT D DEFICIENCY, FRACTURES): Vit D, 25-Hydroxy: 92 ng/mL — ABNORMAL HIGH (ref 30–89)

## 2014-04-08 LAB — MAGNESIUM: MAGNESIUM: 1.9 mg/dL (ref 1.5–2.5)

## 2014-04-08 LAB — VITAMIN B12: Vitamin B-12: 380 pg/mL (ref 211–911)

## 2014-04-25 ENCOUNTER — Telehealth: Payer: Self-pay | Admitting: *Deleted

## 2014-04-25 NOTE — Telephone Encounter (Signed)
PT said ever since he started taking B12 he has been itching uncontrolled all over. He has tried EVERYTHING please advise

## 2014-04-29 ENCOUNTER — Encounter: Payer: Self-pay | Admitting: Internal Medicine

## 2014-04-29 ENCOUNTER — Ambulatory Visit (INDEPENDENT_AMBULATORY_CARE_PROVIDER_SITE_OTHER): Payer: Medicare Other | Admitting: Internal Medicine

## 2014-04-29 VITALS — BP 128/66 | HR 64 | Temp 97.9°F | Resp 18 | Ht 71.75 in | Wt 197.4 lb

## 2014-04-29 DIAGNOSIS — E559 Vitamin D deficiency, unspecified: Secondary | ICD-10-CM | POA: Insufficient documentation

## 2014-04-29 DIAGNOSIS — L500 Allergic urticaria: Secondary | ICD-10-CM

## 2014-04-29 NOTE — Patient Instructions (Signed)
Stay off B12 fo r now (at least 1 week)  ReStart Gabapentin 300 mg at Bedtime for tingling in hands  Also  Take Certirizine 10 mg at Bedtime to prevent hives  If no hives for 3 or 4 days then not likely allergic to Gabapentin and   OK to restart  B12 tablets    Cetirizine tablets (= generic Zyrtec)  What is this medicine? CETIRIZINE (se TI ra zeen) is an antihistamine. This medicine is used to treat or prevent symptoms of allergies. It is also used to help reduce itchy skin rash and hives. This medicine may be used for other purposes; ask your health care provider or pharmacist if you have questions. COMMON BRAND NAME(S): All Day Allergy , Zyrtec Hives Relief , Zyrtec What should I tell my health care provider before I take this medicine? They need to know if you have any of these conditions: -kidney disease -liver disease -an unusual or allergic reaction to cetirizine, hydroxyzine, other medicines, foods, dyes, or preservatives -pregnant or trying to get pregnant -breast-feeding How should I use this medicine? Take this medicine by mouth with a glass of water. Follow the directions on the prescription label. You can take this medicine with food or on an empty stomach. Take your medicine at regular times. Do not take more often than directed. You may need to take this medicine for several days before your symptoms improve. Talk to your pediatrician regarding the use of this medicine in children. Special care may be needed. While this drug may be prescribed for children as young as 8 years of age for selected conditions, precautions do apply. Overdosage: If you think you have taken too much of this medicine contact a poison control center or emergency room at once. NOTE: This medicine is only for you. Do not share this medicine with others. What if I miss a dose? If you miss a dose, take it as soon as you can. If it is almost time for your next dose, take only that dose. Do not take  double or extra doses. What may interact with this medicine? -other medicines for colds or allergies -theophylline This list may not describe all possible interactions. Give your health care provider a list of all the medicines, herbs, non-prescription drugs, or dietary supplements you use. Also tell them if you smoke, drink alcohol, or use illegal drugs. Some items may interact with your medicine. What should I watch for while using this medicine? Visit your doctor or health care professional for regular checks on your health. Tell your doctor if your symptoms do not improve. You may get drowsy or dizzy. Do not drive, use machinery, or do anything that needs mental alertness until you know how this medicine affects you. Do not stand or sit up quickly, especially if you are an older patient. This reduces the risk of dizzy or fainting spells. Your mouth may get dry. Chewing sugarless gum or sucking hard candy, and drinking plenty of water may help. Contact your doctor if the problem does not go away or is severe. What side effects may I notice from receiving this medicine? Side effects that you should report to your doctor or health care professional as soon as possible: -allergic reactions like skin rash, itching or hives, swelling of the face, lips, or tongue -changes in vision or hearing -fast heartbeat -high blood pressure -infection -trouble passing urine or change in the amount of urine Side effects that usually do not require medical attention (report  to your doctor or health care professional if they continue or are bothersome): -irritability -loss of sleep -sore throat -stomach pain -swelling This list may not describe all possible side effects. Call your doctor for medical advice about side effects. You may report side effects to FDA at 1-800-FDA-1088. Where should I keep my medicine? Keep out of the reach of children. Store at room temperature between 15 and 30 degrees C (59 and  86 degrees F). Throw away any unused medicine after the expiration date. NOTE: This sheet is a summary. It may not cover all possible information. If you have questions about this medicine, talk to your doctor, pharmacist, or health care provider.  2014, [employer]/Gold Standard. (2008-01-14 18:28:02) Hives Hives are itchy, red, swollen areas of the skin. They can vary in size and location on your body. Hives can come and go for hours or several days (acute hives) or for several weeks (chronic hives). Hives do not spread from person to person (noncontagious). They may get worse with scratching, exercise, and emotional stress. CAUSES   Allergic reaction to food, additives, or drugs.  Infections, including the common cold.  Illness, such as vasculitis, lupus, or thyroid disease.  Exposure to sunlight, heat, or cold.  Exercise.  Stress.  Contact with chemicals. SYMPTOMS   Red or white swollen patches on the skin. The patches may change size, shape, and location quickly and repeatedly.  Itching.  Swelling of the hands, feet, and face. This may occur if hives develop deeper in the skin. DIAGNOSIS  Your caregiver can usually tell what is wrong by performing a physical exam. Skin or blood tests may also be done to determine the cause of your hives. In some cases, the cause cannot be determined. TREATMENT  Mild cases usually get better with medicines such as antihistamines. Severe cases may require an emergency epinephrine injection. If the cause of your hives is known, treatment includes avoiding that trigger.  HOME CARE INSTRUCTIONS   Avoid causes that trigger your hives.  Take antihistamines as directed by your caregiver to reduce the severity of your hives. Non-sedating or low-sedating antihistamines are usually recommended. Do not drive while taking an antihistamine.  Take any other medicines prescribed for itching as directed by your caregiver.  Wear loose-fitting  clothing.  Keep all follow-up appointments as directed by your caregiver. SEEK MEDICAL CARE IF:   You have persistent or severe itching that is not relieved with medicine.  You have painful or swollen joints. SEEK IMMEDIATE MEDICAL CARE IF:   You have a fever.  Your tongue or lips are swollen.  You have trouble breathing or swallowing.  You feel tightness in the throat or chest.  You have abdominal pain. These problems may be the first sign of a life-threatening allergic reaction. Call your local emergency services (911 in [place] MAKE SURE YOU:   Understand these instructions.  Will watch your condition.  Will get help right away if you are not doing well or get worse. Document Released: 11/07/2005 Document Revised: 05/08/2012 Document Reviewed: 01/31/2012 [facility] Patient Information 2014 [place].

## 2014-04-29 NOTE — Progress Notes (Signed)
Subjective:    Patient ID: Earl [name], male    DOB: 09-29-1941, 73 y.o.   MRN: [id number]  HPI Patient presents with c/o intermittent hives after recently starting Gabapentin and SL B12 tabs.  Denies any other new exposures and no Sx's of allergy or infection otherwise.   Medication List   aspirin 81 MG tablet  Take 81 mg by mouth daily.     atenolol 100 MG tablet  Commonly known as:  TENORMIN  Take 1 tablet (100 mg total) by mouth daily.     cyclobenzaprine 10 MG tablet  Commonly known as:  FLEXERIL  Take 1 tablet (10 mg total) by mouth at bedtime as needed for muscle spasms (for jaw pain).     ferrous sulfate 325 (65 FE) MG EC tablet  Take 325 mg by mouth 3 (three) times daily with meals.     finasteride 5 MG tablet  Commonly known as:  PROSCAR  Take 5 mg by mouth daily.     gabapentin 300 MG capsule  Commonly known as:  NEURONTIN  1-2 at night for pain     glipiZIDE 5 MG tablet  Commonly known as:  GLUCOTROL     glyBURIDE 5 MG tablet  Commonly known as:  DIABETA  Take 5 mg by mouth daily with breakfast.     hydrochlorothiazide 25 MG tablet  Commonly known as:  HYDRODIURIL  Take 25 mg by mouth daily.     HYDROcodone-acetaminophen 5-325 MG per tablet  Commonly known as:  NORCO  1/2 to 1 tablet every 3 to 4 hours as need for cough or pain     levothyroxine 50 MCG tablet  Commonly known as:  SYNTHROID, LEVOTHROID  Take 50 mcg by mouth daily before breakfast.     Magnesium 250 MG Tabs  Take 250 mg by mouth daily.     metFORMIN 500 MG tablet  Commonly known as:  GLUCOPHAGE  Take 500 mg by mouth 4 (four) times daily.     multivitamin capsule  Take 1 capsule by mouth daily.     potassium chloride 10 MEQ CR tablet  Commonly known as:  KLOR-CON  Take 10 mEq by mouth daily.     predniSONE 20 MG tablet  Commonly known as:  DELTASONE  1 tab 3 x day for 2 days, then 1 tab 2 x day for 2 days, then 1 tab 1 x day for 3 days     quinapril 40 MG tablet   Commonly known as:  ACCUPRIL  Take 40 mg by mouth at bedtime.     simvastatin 40 MG tablet  Commonly known as:  ZOCOR  Take 1 tablet at bedtime  or as directed for cholesterol     vitamin C 500 MG tablet  Commonly known as:  ASCORBIC ACID  Take 500 mg by mouth daily.     VITAMIN D PO  Take 5,000 Units by mouth daily.     Allergies  Allergen Reactions  . Penicillins    Past Medical History  Diagnosis Date  . Diabetes mellitus   . Hypertension   . Hyperlipidemia   . Atrial fibrillation   . Type II or unspecified type diabetes mellitus without mention of complication, not stated as uncontrolled   . BPH (benign prostatic hyperplasia)   . Other testicular hypofunction    Past Surgical History  Procedure Laterality Date  . Inguinal hernia repair      1968 and 2004   Review of  Systems In addition to the HPI above,  No Fever-chills,  No Headache, No changes with Vision or hearing,  No problems swallowing food or Liquids,  No Chest pain or productive Cough or Shortness of Breath,  No Abdominal pain, No Nausea or Vommitting, Bowel movements are regular,  No Blood in stool or Urine,  No dysuria,  No new skin rashes or bruises,  No new joints pains-aches,  No new weakness, tingling, numbness in any extremity,  No recent weight loss,  No polyuria, polydypsia or polyphagia,  No significant Mental Stressors.  A full 10 point Review of Systems was done, except as stated above, all other Review of Systems were negative  Objective:   Physical Exam  BP 128/66  Pulse 64  Temp 97.9 F   Resp 18  Ht 5' 11.75"   Wt 197 lb 6.4 oz   BMI 26.97 kg/m2  Exam focused on skin finds no current rash, hibves  Or angioedema and patient appears in no distress.  Assessment & Plan:   1. Allergic urticaria  - recc hold B12 tabs for now - then restart Gabapentin along with Certirizine 10 mg qd and if no recurrent hives for 5-7 days, then rechallenge with the B12 tabs.

## 2014-05-04 ENCOUNTER — Other Ambulatory Visit: Payer: Self-pay | Admitting: Internal Medicine

## 2014-05-14 ENCOUNTER — Other Ambulatory Visit: Payer: Self-pay | Admitting: *Deleted

## 2014-05-14 ENCOUNTER — Other Ambulatory Visit: Payer: Self-pay | Admitting: Internal Medicine

## 2014-05-14 MED ORDER — QUINAPRIL HCL 40 MG PO TABS: ORAL_TABLET | ORAL | Status: DC

## 2014-05-14 MED ORDER — LEVOTHYROXINE SODIUM 50 MCG PO TABS: 50.0000 ug | ORAL_TABLET | Freq: Every day | ORAL | Status: DC

## 2014-05-14 MED ORDER — HYDROCHLOROTHIAZIDE 25 MG PO TABS: ORAL_TABLET | ORAL | Status: DC

## 2014-06-09 ENCOUNTER — Ambulatory Visit (INDEPENDENT_AMBULATORY_CARE_PROVIDER_SITE_OTHER): Payer: Medicare Other | Admitting: Physician Assistant

## 2014-06-09 ENCOUNTER — Ambulatory Visit (HOSPITAL_COMMUNITY)
Admission: RE | Admit: 2014-06-09 | Discharge: 2014-06-09 | Disposition: A | Discharge status: Home / Self Care | Payer: Medicare Other | Source: Ambulatory Visit | Attending: Physician Assistant | Admitting: Physician Assistant

## 2014-06-09 ENCOUNTER — Encounter: Payer: Self-pay | Admitting: Physician Assistant

## 2014-06-09 VITALS — BP 110/60 | HR 60 | Temp 98.1°F | Resp 16 | Wt 197.0 lb

## 2014-06-09 DIAGNOSIS — R51 Headache: Secondary | ICD-10-CM

## 2014-06-09 DIAGNOSIS — R209 Unspecified disturbances of skin sensation: Secondary | ICD-10-CM | POA: Insufficient documentation

## 2014-06-09 DIAGNOSIS — M542 Cervicalgia: Secondary | ICD-10-CM

## 2014-06-09 DIAGNOSIS — R519 Headache, unspecified: Secondary | ICD-10-CM

## 2014-06-09 DIAGNOSIS — M47812 Spondylosis without myelopathy or radiculopathy, cervical region: Secondary | ICD-10-CM | POA: Insufficient documentation

## 2014-06-09 MED ORDER — AZITHROMYCIN 250 MG PO TABS: ORAL_TABLET | ORAL | Status: AC

## 2014-06-09 NOTE — Progress Notes (Signed)
   Subjective:    Patient ID: Earl [name], male    DOB: March 20, 1941, 72 y.o.   MRN: [id number]  Sinus Problem This is a new problem. Episode onset: 3 days. The problem is unchanged. There has been no fever. Pain severity now: left occipital/paritel pain worse at night/in the morning. Associated symptoms include headaches (left occipital/parietal) and sneezing. Pertinent negatives include no chills, congestion, coughing, diaphoresis, ear pain, hoarse voice, neck pain, shortness of breath, sinus pressure, sore throat or swollen glands. Treatments tried: claritin.   Has some numbness and tingling bilateral hands, worse early AM/late at night.    Review of Systems  Constitutional: Negative.  Negative for chills and diaphoresis.  HENT: Positive for sneezing. Negative for congestion, dental problem, drooling, ear discharge, ear pain, facial swelling, hearing loss, hoarse voice, mouth sores, sinus pressure and sore throat.   Respiratory: Negative.  Negative for cough and shortness of breath.   Cardiovascular: Negative.   Gastrointestinal: Negative.   Genitourinary: Negative.   Musculoskeletal: Negative.  Negative for neck pain.  Neurological: Positive for numbness and headaches (left occipital/parietal). Negative for dizziness, tremors, seizures, syncope, facial asymmetry, speech difficulty, weakness and light-headedness.       Objective:   Physical Exam  Constitutional: He is oriented to person, place, and time. He appears well-developed and well-nourished.  HENT:  Head: Normocephalic and atraumatic.  Right Ear: External ear normal.  Nose: Nose normal.  Mouth/Throat: Oropharynx is clear and moist. No oropharyngeal exudate.  + TMJ tenderness left more than right  Eyes: Conjunctivae and EOM are normal. Pupils are equal, round, and reactive to light.  Neck: Normal range of motion. Neck supple. No JVD present. No tracheal deviation present. Thyromegaly present.  Cardiovascular: Normal rate  and regular rhythm.   Pulmonary/Chest: Effort normal and breath sounds normal.  Abdominal: Soft. Bowel sounds are normal.  Musculoskeletal: Normal range of motion.  Lymphadenopathy:    He has no cervical adenopathy.  Neurological: He is alert and oriented to person, place, and time. He has normal reflexes. He displays normal reflexes. No cranial nerve deficit. He exhibits normal muscle tone. Coordination normal.  Skin: Skin is warm and dry. No rash noted.       Assessment & Plan:  Left occipital pain with neck pain-Normal neuro, no bruit ? From neck- get Xray Continue claritin, check CBC ? From TMJ- information given to the patient, no gum/decrease hard foods, warm wet wash clothes, decrease stress, talk with dentist about possible night guard, can do massage, and exercise.   Bilateral hand numbness- recheck CBC, B12, and get Cervical neck xray

## 2014-06-09 NOTE — Patient Instructions (Signed)
Continue Claritin daily Can take zpak if sinus symptoms worsen Get neck xray Continue B12  Do heating pad on left head/jaw Soft foods Chew on right side Can take flexeril 10mg  at night for 5 nights  If you get ANY changes in vision, speech, severe weakness, confusion, go to ER  What is the TMJ? The temporomandibular (tem-PUH-ro-man-DIB-yoo-ler) joint, or the TMJ, connects the upper and lower jawbones. This joint allows the jaw to open wide and move back and forth when you chew, talk, or yawn.There are also several muscles that help this joint move. There can be muscle tightness and pain in the muscle that can cause several symptoms.  What causes TMJ pain? There are many causes of TMJ pain. Repeated chewing (for example, chewing gum) and clenching your teeth can cause pain in the joint. Some TMJ pain has no obvious cause. What can I do to ease the pain? There are many things you can do to help your pain get better. When you have pain:  Eat soft foods and stay away from chewy foods (for example, taffy) Try to use both sides of your mouth to chew Don't chew gum Don't open your mouth wide (for example, during yawning or singing) Don't bite your cheeks or fingernails Lower your amount of stress and worry Applying a warm, damp washcloth to the joint may help. Over-the-counter pain medicines such as ibuprofen (one brand: Advil) or acetaminophen (one brand: Tylenol) might also help. Do not use these medicines if you are allergic to them or if your doctor told you not to use them. How can I stop the pain from coming back? When your pain is better, you can do these exercises to make your muscles stronger and to keep the pain from coming back:  Resisted mouth opening: Place your thumb or two fingers under your chin and open your mouth slowly, pushing up lightly on your chin with your thumb. Hold for three to six seconds. Close your mouth slowly. Resisted mouth closing: Place your thumbs under your  chin and your two index fingers on the ridge between your mouth and the bottom of your chin. Push down lightly on your chin as you close your mouth. Tongue up: Slowly open and close your mouth while keeping the tongue touching the roof of the mouth. Side-to-side jaw movement: Place an object about one fourth of an inch thick (for example, two tongue depressors) between your front teeth. Slowly move your jaw from side to side. Increase the thickness of the object as the exercise becomes easier Forward jaw movement: Place an object about one fourth of an inch thick between your front teeth and move the bottom jaw forward so that the bottom teeth are in front of the top teeth. Increase the thickness of the object as the exercise becomes easier. These exercises should not be painful. If it hurts to do these exercises, stop doing them and talk to your family doctor.

## 2014-06-27 ENCOUNTER — Other Ambulatory Visit: Payer: Self-pay | Admitting: Internal Medicine

## 2014-07-10 ENCOUNTER — Ambulatory Visit (INDEPENDENT_AMBULATORY_CARE_PROVIDER_SITE_OTHER): Payer: Medicare Other | Admitting: Internal Medicine

## 2014-07-10 ENCOUNTER — Encounter: Payer: Self-pay | Admitting: Internal Medicine

## 2014-07-10 VITALS — BP 124/66 | HR 60 | Temp 97.8°F | Resp 16 | Ht 71.75 in | Wt 196.2 lb

## 2014-07-10 DIAGNOSIS — E782 Mixed hyperlipidemia: Secondary | ICD-10-CM

## 2014-07-10 DIAGNOSIS — E559 Vitamin D deficiency, unspecified: Secondary | ICD-10-CM

## 2014-07-10 DIAGNOSIS — E119 Type 2 diabetes mellitus without complications: Secondary | ICD-10-CM

## 2014-07-10 DIAGNOSIS — Z79899 Other long term (current) drug therapy: Secondary | ICD-10-CM

## 2014-07-10 DIAGNOSIS — I10 Essential (primary) hypertension: Secondary | ICD-10-CM

## 2014-07-10 LAB — BASIC METABOLIC PANEL WITH GFR
BUN: 17 mg/dL (ref 6–23)
CO2: 26 mEq/L (ref 19–32)
CREATININE: 1.22 mg/dL (ref 0.50–1.35)
Calcium: 9.3 mg/dL (ref 8.4–10.5)
Chloride: 100 mEq/L (ref 96–112)
GFR, Est African American: 68 mL/min
GFR, Est Non African American: 58 mL/min — ABNORMAL LOW
GLUCOSE: 193 mg/dL — AB (ref 70–99)
Potassium: 4.3 mEq/L (ref 3.5–5.3)
SODIUM: 138 meq/L (ref 135–145)

## 2014-07-10 LAB — LIPID PANEL
CHOL/HDL RATIO: 2.3 ratio
CHOLESTEROL: 116 mg/dL (ref 0–200)
HDL: 50 mg/dL (ref >39)
LDL Cholesterol: 54 mg/dL (ref 0–99)
Triglycerides: 60 mg/dL (ref <150)
VLDL: 12 mg/dL (ref 0–40)

## 2014-07-10 LAB — HEPATIC FUNCTION PANEL
ALBUMIN: 3.9 g/dL (ref 3.5–5.2)
ALT: 15 U/L (ref 0–53)
AST: 18 U/L (ref 0–37)
Alkaline Phosphatase: 58 U/L (ref 39–117)
BILIRUBIN DIRECT: 0.1 mg/dL (ref 0.0–0.3)
Indirect Bilirubin: 0.5 mg/dL (ref 0.2–1.2)
Total Bilirubin: 0.6 mg/dL (ref 0.2–1.2)
Total Protein: 6 g/dL (ref 6.0–8.3)

## 2014-07-10 LAB — HEMOGLOBIN A1C
HEMOGLOBIN A1C: 7.3 % — AB (ref <5.7)
MEAN PLASMA GLUCOSE: 163 mg/dL — AB (ref <117)

## 2014-07-10 LAB — CBC WITH DIFFERENTIAL/PLATELET
BASOS PCT: 0 % (ref 0–1)
Basophils Absolute: 0 10*3/uL (ref 0.0–0.1)
Eosinophils Absolute: 0.1 10*3/uL (ref 0.0–0.7)
Eosinophils Relative: 2 % (ref 0–5)
HCT: 38 % — ABNORMAL LOW (ref 39.0–52.0)
Hemoglobin: 13.1 g/dL (ref 13.0–17.0)
Lymphocytes Relative: 42 % (ref 12–46)
Lymphs Abs: 1.9 10*3/uL (ref 0.7–4.0)
MCH: 28.7 pg (ref 26.0–34.0)
MCHC: 34.5 g/dL (ref 30.0–36.0)
MCV: 83.2 fL (ref 78.0–100.0)
MONOS PCT: 10 % (ref 3–12)
Monocytes Absolute: 0.5 10*3/uL (ref 0.1–1.0)
Neutro Abs: 2.1 10*3/uL (ref 1.7–7.7)
Neutrophils Relative %: 46 % (ref 43–77)
PLATELETS: 198 10*3/uL (ref 150–400)
RBC: 4.57 MIL/uL (ref 4.22–5.81)
RDW: 13.7 % (ref 11.5–15.5)
WBC: 4.6 10*3/uL (ref 4.0–10.5)

## 2014-07-10 LAB — MAGNESIUM: MAGNESIUM: 1.8 mg/dL (ref 1.5–2.5)

## 2014-07-10 LAB — TSH: TSH: 0.951 u[IU]/mL (ref 0.350–4.500)

## 2014-07-10 NOTE — Progress Notes (Signed)
Patient ID: Earl [name], male   DOB: February 16, 1941, 73 y.o.   MRN: [id number]   This very nice 73 y.o.MBM presents for 3 month follow up with Hypertension, Hyperlipidemia, T2_NIDDM and Vitamin D Deficiency.    Patient is treated for HTN (1980) & BP has been controlled at home. Today's BP: 124/66 mmHg. Patient denies any cardiac type chest pain, palpitations, dyspnea/orthopnea/PND, dizziness, claudication, or dependent edema.   Hyperlipidemia is controlled with diet & meds. Patient denies myalgias or other med SE's. Last Lipids were  Cholesterol 118; HDL  46; LDL  57; Triglycerides 74 on 04/07/2014 - all at goal.   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 He reports DU 2-3 x/wk remain negative (& he still declines CBG monitoring as long as DU's remain negative).  Last A1c was 7.0% on  04/07/2014.    Further, Patient has history of Vitamin D Deficiency and patient supplements vitamin D without any suspected side-effects. Last vitamin D was  92 on 04/07/2014.   Medication List   aspirin 81 MG tablet  Take 81 mg by mouth daily.     atenolol 100 MG tablet  Take 1 tablet (100 mg total) by mouth daily.     cyclobenzaprine 10 MG tablet  Take 1 tablet  at bedtime as needed for muscle spasms      ferrous sulfate 325 (65 FE) MG EC tablet  Take 325 mg by mouth 3 (three) times daily with meals.     finasteride 5 MG tablet  Take 1 tablet by mouth  every day for prostate     gabapentin 300 MG capsule  1-2 at night for pain     glipiZIDE 5 MG tablet  Commonly known as:  GLUCOTROL  Take 1 tablet by mouth 3  times a day before meals  for diabetes     glyBURIDE 5 MG tablet  Commonly known as:  DIABETA  Take 5 mg by mouth daily with breakfast.     hydrochlorothiazide 25 MG tablet  Commonly known as:  HYDRODIURIL  Take 1 tablet by mouth  every day for  blood  pressure and fluid     levothyroxine 50 MCG tablet  Take 1 tablet (50 mcg total) by mouth daily before breakfast.     Magnesium 250 MG Tabs  Take 250 mg by mouth daily.     metFORMIN 500 MG tablet  Take 500 mg by mouth 4 (four) times daily.     multivitamin capsule  Take 1 capsule by mouth daily.     potassium chloride 10 MEQ CR tablet  Take 10 mEq by mouth daily.     quinapril 40 MG tablet  Take 1 tablet by mouth  every day for blood  pressure     simvastatin 40 MG tablet  Take 1 tablet at bedtime  or as directed for cholesterol     vitamin C 500 MG tablet  Take 500 mg by mouth daily.     VITAMIN D PO  Take 5,000 Units by mouth daily.       Allergies  Allergen Reactions  . Penicillins    PMHx:   Past Medical History  Diagnosis Date  . Diabetes mellitus   . Hypertension   . Hyperlipidemia   . Atrial fibrillation   . Type II or unspecified type diabetes mellitus without mention of complication, not stated  as uncontrolled   . BPH (benign prostatic hyperplasia)   . Other testicular hypofunction    FHx:    Reviewed / unchanged SHx:    Reviewed / unchanged  Systems Review:  Constitutional: Denies fever, chills, wt changes, headaches, insomnia, fatigue, night sweats, change in appetite. Eyes: Denies redness, blurred vision, diplopia, discharge, itchy, watery eyes.  ENT: Denies discharge, congestion, post nasal drip, epistaxis, sore throat, earache, hearing loss, dental pain, tinnitus, vertigo, sinus pain, snoring.  CV: Denies chest pain, palpitations, irregular heartbeat, syncope, dyspnea, diaphoresis, orthopnea, PND, claudication or edema. Respiratory: denies cough, dyspnea, DOE, pleurisy, hoarseness, laryngitis, wheezing.  Gastrointestinal: Denies dysphagia, odynophagia, heartburn, reflux, water brash, abdominal pain or cramps, nausea, vomiting, bloating, diarrhea, constipation, hematemesis, melena, hematochezia  or hemorrhoids. Genitourinary: Denies  dysuria, frequency, urgency, nocturia, hesitancy, discharge, hematuria or flank pain. Musculoskeletal: Denies arthralgias, myalgias, stiffness, jt. swelling, pain, limping or strain/sprain.  Skin: Denies pruritus, rash, hives, warts, acne, eczema or change in skin lesion(s). Neuro: No weakness, tremor, incoordination, spasms, paresthesia or pain. Psychiatric: Denies confusion, memory loss or sensory loss. Endo: Denies change in weight, skin or hair change.  Heme/Lymph: No excessive bleeding, bruising or enlarged lymph nodes.  Exam:  BP 124/66  Pulse 60  Temp 97.8 F   Resp 16  Ht 5' 11.75"   Wt 196 lb 3.2 oz   BMI 26.81 kg/m2  Appears well nourished and in no distress. Eyes: PERRLA, EOMs, conjunctiva no swelling or erythema. Sinuses: No frontal/maxillary tenderness ENT/Mouth: EAC's clear, TM's nl w/o erythema, bulging. Nares clear w/o erythema, swelling, exudates. Oropharynx clear without erythema or exudates. Oral hygiene is good. Tongue normal, non obstructing. Hearing intact.  Neck: Supple. Thyroid nl. Car 2+/2+ without bruits, nodes or JVD. Chest: Respirations nl with BS clear & equal w/o rales, rhonchi, wheezing or stridor.  Cor: Heart sounds normal w/ regular rate and rhythm without sig. murmurs, gallops, clicks, or rubs. Peripheral pulses normal and equal  without edema.  Abdomen: Soft & bowel sounds normal. Non-tender w/o guarding, rebound, hernias, masses, or organomegaly.  Lymphatics: Unremarkable.  Musculoskeletal: Full ROM all peripheral extremities, joint stability, 5/5 strength, and normal gait.  Skin: Warm, dry without exposed rashes, lesions or ecchymosis apparent.  Neuro: Cranial nerves intact, reflexes equal bilaterally. Sensory-motor testing grossly intact. Tendon reflexes grossly intact.  Pysch: Alert & oriented x 3.  Insight and judgement nl & appropriate. No ideations.   Assessment and Plan:  1. Hypertension - Continue monitor blood pressure at home. Continue  diet/meds same.  2. Hyperlipidemia - Continue diet/meds, exercise,& lifestyle modifications. Continue monitor periodic cholesterol/liver & renal functions   3. T2_NIDDM - controlled  4. Vitamin D Deficiency - Continue supplementation.  Recommended regular exercise, BP monitoring, weight control, and discussed med and SE's. Recommended labs to assess and monitor clinical status. Further disposition pending results of labs.

## 2014-07-10 NOTE — Patient Instructions (Signed)
   Recommend the book "The END of DIETING" by Dr Joel Furman   and the book "The END of DIABETES " by Dr Joel Fuhrman  At [employer] - get book & Audio CD's      Being diabetic has a  300% increased risk for heart attack, stroke, cancer, and alzheimer- type vascular dementia. It is very important that you work harder with diet by avoiding all foods that are white except chicken & fish. Avoid white rice (brown & wild rice is OK), white potatoes (sweetpotatoes in moderation is OK), White bread or wheat bread or anything made out of white flour like bagels, donuts, rolls, buns, biscuits, cakes, pastries, cookies, pizza crust, and pasta (made from white flour & egg whites) - vegetarian pasta or spinach or wheat pasta is OK. Multigrain breads like Arnold's or [employer], or multigrain sandwich thins or flatbreads.  Diet, exercise and weight loss can reverse and cure diabetes in the early stages.  Diet, exercise and weight loss is very important in the control and prevention of complications of diabetes which affects every system in your body, ie. Brain - dementia/stroke, eyes - glaucoma/blindness, heart - heart attack/heart failure, kidneys - dialysis, stomach - gastric paralysis, intestines - malabsorption, nerves - severe painful neuritis, circulation - gangrene & loss of a leg(s), and finally cancer and Alzheimers.    I recommend avoid fried & greasy foods,  sweets/candy, white rice (brown or wild rice or Quinoa is OK), white potatoes (sweet potatoes are OK) - anything made from white flour - bagels, doughnuts, rolls, buns, biscuits,white and wheat breads, pizza crust and traditional pasta made of white flour & egg white(vegetarian pasta or spinach or wheat pasta is OK).  Multi-grain bread is OK - like multi-grain flat bread or sandwich thins. Avoid alcohol in excess. Exercise is also important.    Eat all the vegetables you want - avoid meat, especially red meat and dairy - especially cheese.  Cheese  is the most concentrated form of trans-fats which is the worst thing to clog up our arteries. Veggie cheese is OK which can be found in the fresh produce section at [employer] or [employer] or [employer]   

## 2014-07-11 LAB — VITAMIN D 25 HYDROXY (VIT D DEFICIENCY, FRACTURES): VIT D 25 HYDROXY: 102 ng/mL — AB (ref 30–89)

## 2014-07-11 LAB — INSULIN, FASTING: Insulin fasting, serum: 40 u[IU]/mL — ABNORMAL HIGH (ref 3–28)

## 2014-07-19 ENCOUNTER — Other Ambulatory Visit: Payer: Self-pay | Admitting: Internal Medicine

## 2014-07-21 ENCOUNTER — Other Ambulatory Visit: Payer: Self-pay | Admitting: *Deleted

## 2014-07-21 DIAGNOSIS — D472 Monoclonal gammopathy: Secondary | ICD-10-CM

## 2014-07-21 MED ORDER — GABAPENTIN 300 MG PO CAPS: ORAL_CAPSULE | ORAL | Status: DC

## 2014-07-21 MED ORDER — SIMVASTATIN 40 MG PO TABS: ORAL_TABLET | ORAL | Status: DC

## 2014-07-21 MED ORDER — FINASTERIDE 5 MG PO TABS: ORAL_TABLET | ORAL | Status: DC

## 2014-08-19 ENCOUNTER — Ambulatory Visit (INDEPENDENT_AMBULATORY_CARE_PROVIDER_SITE_OTHER): Payer: Medicare Other | Admitting: *Deleted

## 2014-08-19 DIAGNOSIS — Z23 Encounter for immunization: Secondary | ICD-10-CM

## 2014-10-28 ENCOUNTER — Telehealth: Payer: Self-pay | Admitting: Oncology

## 2014-10-28 NOTE — Telephone Encounter (Signed)
moved from [facility] 12/17 to [facility] 12/23. lmonvm for pt and mailed schedule. appt for 12/14 remains the same.

## 2014-10-31 ENCOUNTER — Other Ambulatory Visit: Payer: Self-pay | Admitting: Oncology

## 2014-10-31 DIAGNOSIS — D472 Monoclonal gammopathy: Secondary | ICD-10-CM

## 2014-11-03 ENCOUNTER — Other Ambulatory Visit (HOSPITAL_BASED_OUTPATIENT_CLINIC_OR_DEPARTMENT_OTHER): Payer: Medicare Other

## 2014-11-03 DIAGNOSIS — D472 Monoclonal gammopathy: Secondary | ICD-10-CM

## 2014-11-03 LAB — COMPREHENSIVE METABOLIC PANEL (CC13)
ALK PHOS: 59 U/L (ref 40–150)
ALT: 19 U/L (ref 0–55)
AST: 21 U/L (ref 5–34)
Albumin: 3.8 g/dL (ref 3.5–5.0)
Anion Gap: 12 mEq/L — ABNORMAL HIGH (ref 3–11)
BILIRUBIN TOTAL: 0.66 mg/dL (ref 0.20–1.20)
BUN: 16.2 mg/dL (ref 7.0–26.0)
CO2: 25 mEq/L (ref 22–29)
Calcium: 9.6 mg/dL (ref 8.4–10.4)
Chloride: 104 mEq/L (ref 98–109)
Creatinine: 1.2 mg/dL (ref 0.7–1.3)
EGFR: 70 mL/min/{1.73_m2} — ABNORMAL LOW (ref >90)
GLUCOSE: 100 mg/dL (ref 70–140)
Potassium: 3.9 mEq/L (ref 3.5–5.1)
SODIUM: 141 meq/L (ref 136–145)
TOTAL PROTEIN: 6.3 g/dL — AB (ref 6.4–8.3)

## 2014-11-03 LAB — CBC WITH DIFFERENTIAL/PLATELET
BASO%: 0.4 % (ref 0.0–2.0)
Basophils Absolute: 0 10*3/uL (ref 0.0–0.1)
EOS%: 3.5 % (ref 0.0–7.0)
Eosinophils Absolute: 0.2 10*3/uL (ref 0.0–0.5)
HCT: 39.1 % (ref 38.4–49.9)
HGB: 13.3 g/dL (ref 13.0–17.1)
LYMPH%: 49.6 % — ABNORMAL HIGH (ref 14.0–49.0)
MCH: 28.8 pg (ref 27.2–33.4)
MCHC: 34 g/dL (ref 32.0–36.0)
MCV: 84.6 fL (ref 79.3–98.0)
MONO#: 0.5 10*3/uL (ref 0.1–0.9)
MONO%: 11.2 % (ref 0.0–14.0)
NEUT#: 1.6 10*3/uL (ref 1.5–6.5)
NEUT%: 35.3 % — ABNORMAL LOW (ref 39.0–75.0)
PLATELETS: 162 10*3/uL (ref 140–400)
RBC: 4.62 10*6/uL (ref 4.20–5.82)
RDW: 13.9 % (ref 11.0–14.6)
WBC: 4.6 10*3/uL (ref 4.0–10.3)
lymph#: 2.3 10*3/uL (ref 0.9–3.3)

## 2014-11-05 ENCOUNTER — Encounter: Payer: Self-pay | Admitting: Physician Assistant

## 2014-11-05 ENCOUNTER — Ambulatory Visit (INDEPENDENT_AMBULATORY_CARE_PROVIDER_SITE_OTHER): Payer: Medicare Other | Admitting: Physician Assistant

## 2014-11-05 VITALS — BP 110/70 | HR 56 | Temp 97.6°F | Resp 16 | Ht 71.75 in | Wt 189.0 lb

## 2014-11-05 DIAGNOSIS — E782 Mixed hyperlipidemia: Secondary | ICD-10-CM

## 2014-11-05 DIAGNOSIS — E559 Vitamin D deficiency, unspecified: Secondary | ICD-10-CM

## 2014-11-05 DIAGNOSIS — E1122 Type 2 diabetes mellitus with diabetic chronic kidney disease: Secondary | ICD-10-CM

## 2014-11-05 DIAGNOSIS — Z79899 Other long term (current) drug therapy: Secondary | ICD-10-CM

## 2014-11-05 DIAGNOSIS — N400 Enlarged prostate without lower urinary tract symptoms: Secondary | ICD-10-CM

## 2014-11-05 DIAGNOSIS — R350 Frequency of micturition: Secondary | ICD-10-CM

## 2014-11-05 DIAGNOSIS — N182 Chronic kidney disease, stage 2 (mild): Secondary | ICD-10-CM

## 2014-11-05 DIAGNOSIS — E291 Testicular hypofunction: Secondary | ICD-10-CM

## 2014-11-05 DIAGNOSIS — I480 Paroxysmal atrial fibrillation: Secondary | ICD-10-CM

## 2014-11-05 DIAGNOSIS — I10 Essential (primary) hypertension: Secondary | ICD-10-CM

## 2014-11-05 LAB — SPEP & IFE WITH QIG
ALBUMIN ELP: 61 % (ref 55.8–66.1)
Alpha-1-Globulin: 4.8 % (ref 2.9–4.9)
Alpha-2-Globulin: 10.6 % (ref 7.1–11.8)
BETA 2: 6.9 % — AB (ref 3.2–6.5)
BETA GLOBULIN: 7.3 % — AB (ref 4.7–7.2)
GAMMA GLOBULIN: 9.4 % — AB (ref 11.1–18.8)
IGA: 298 mg/dL (ref 68–379)
IGM, SERUM: 14 mg/dL — AB (ref 41–251)
IgG (Immunoglobin G), Serum: 581 mg/dL — ABNORMAL LOW (ref 650–1600)
M-Spike, %: 0.24 g/dL
Total Protein, Serum Electrophoresis: 6.3 g/dL (ref 6.0–8.3)

## 2014-11-05 LAB — CBC WITH DIFFERENTIAL/PLATELET
BASOS PCT: 0 % (ref 0–1)
Basophils Absolute: 0 10*3/uL (ref 0.0–0.1)
Eosinophils Absolute: 0.1 10*3/uL (ref 0.0–0.7)
Eosinophils Relative: 2 % (ref 0–5)
HEMATOCRIT: 37.2 % — AB (ref 39.0–52.0)
HEMOGLOBIN: 13 g/dL (ref 13.0–17.0)
LYMPHS ABS: 2.3 10*3/uL (ref 0.7–4.0)
LYMPHS PCT: 39 % (ref 12–46)
MCH: 28.7 pg (ref 26.0–34.0)
MCHC: 34.9 g/dL (ref 30.0–36.0)
MCV: 82.1 fL (ref 78.0–100.0)
MONOS PCT: 10 % (ref 3–12)
MPV: 9.7 fL (ref 9.4–12.4)
Monocytes Absolute: 0.6 10*3/uL (ref 0.1–1.0)
NEUTROS ABS: 2.8 10*3/uL (ref 1.7–7.7)
NEUTROS PCT: 49 % (ref 43–77)
Platelets: 182 10*3/uL (ref 150–400)
RBC: 4.53 MIL/uL (ref 4.22–5.81)
RDW: 14 % (ref 11.5–15.5)
WBC: 5.8 10*3/uL (ref 4.0–10.5)

## 2014-11-05 LAB — KAPPA/LAMBDA LIGHT CHAINS
Kappa free light chain: 1.01 mg/dL (ref 0.33–1.94)
Kappa:Lambda Ratio: 0.48 (ref 0.26–1.65)
Lambda Free Lght Chn: 2.09 mg/dL (ref 0.57–2.63)

## 2014-11-05 NOTE — Progress Notes (Signed)
Assessment and Plan:  Hypertension: Continue medication, monitor blood pressure at home. Continue DASH diet.  Reminder to go to the ER if any CP, SOB, nausea, dizziness, severe HA, changes vision/speech, left arm numbness and tingling and jaw pain. Cholesterol: Continue diet and exercise. Check cholesterol.  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 neuorpathy-Continue diet and exercise. Check A1C Vitamin D Def- check level and continue medications.  Hypothyroidism with thyroimegaly-check TSH level, continue medications the same, reminded to take on an empty stomach 30-43mins before food.  BPH symptoms- bladder matters given- he is on finesteride 1 pill, will check urine rule out infection.   Continue diet and meds as discussed. Further disposition pending results of labs. Discussed med's effects and SE's.   HPI 73 y.o. male  presents for 3 month follow up with hypertension, hyperlipidemia, diabetes and vitamin D. His blood pressure has been controlled at home, he is on atenolol 100mg , HCTZ 25mg , quinapril 40mg , today their BP is BP: 110/70 mmHg He does workout. He denies chest pain, shortness of breath, dizziness.  He is on cholesterol medication, simvastatin 40mg  and denies myalgias. His cholesterol is at goal. 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 neuropathy, he is on bASA, he is on ACE/ARB, he is on glipizide 5 mg, MF 500mg  and denies polydipsia, polyuria and visual disturbances. Last A1C was: 07/10/2014: Hemoglobin-A1c 7.3* Patient is on Vitamin D supplement. 07/10/2014: Vit D, 25-Hydroxy 102*  He is on thyroid medication. His medication was not changed last visit, he is on 24mcg daily.  Lab Results  Component Value Date   TSH 0.951  07/10/2014    Current Medications:  Current Outpatient Prescriptions on File Prior to Visit  Medication Sig Dispense Refill  . aspirin 81 MG tablet Take 81 mg by mouth daily.    [name] atenolol (TENORMIN) 100 MG tablet Take 1 tablet (100 mg total) by mouth daily. 90 tablet 11  . Cholecalciferol (VITAMIN D PO) Take 5,000 Units by mouth daily.    . cyclobenzaprine (FLEXERIL) 10 MG tablet Take 1 tablet (10 mg total) by mouth at bedtime as needed for muscle spasms (for jaw pain). 60 tablet 1  . finasteride (PROSCAR) 5 MG tablet Take 1 tablet by mouth  every day for prostate 90 tablet 3  . gabapentin (NEURONTIN) 300 MG capsule 1-2 at night for pain 180 capsule 3  . glipiZIDE (GLUCOTROL) 5 MG tablet Take 1 tablet by mouth 3   times a day before meals   for diabetes 270 tablet 99  . hydrochlorothiazide (HYDRODIURIL) 25 MG tablet Take 1 tablet by mouth  every day for blood  pressure and fluid 90 tablet 3  . levothyroxine (SYNTHROID, LEVOTHROID) 50 MCG tablet Take 1 tablet (50 mcg total) by mouth daily before breakfast. 90 tablet 3  . Magnesium 250 MG TABS Take 250 mg by mouth daily.    . metFORMIN (GLUCOPHAGE-XR) 500 MG 24 hr tablet Take 1 tablet by mouth 4  times daily for diabetes 360 tablet 99  . Multiple Vitamin (MULTIVITAMIN) capsule Take 1 capsule by mouth daily.      . potassium chloride (KLOR-CON) 10 MEQ CR tablet Take 10 mEq by mouth daily.      . quinapril (ACCUPRIL) 40 MG tablet Take 1  tablet by mouth  every day for blood  pressure 90 tablet 3  . simvastatin (ZOCOR) 40 MG tablet Take 1 tablet at bedtime  or as directed for cholesterol 90 tablet 3  . vitamin C (ASCORBIC ACID) 500 MG tablet Take 500 mg by mouth daily.       No current facility-administered medications on file prior to visit.   Medical History:  Past Medical History  Diagnosis Date  . Diabetes mellitus   . Hypertension   . Hyperlipidemia   . Atrial fibrillation   . Type II or unspecified type diabetes mellitus without  mention of complication, not stated as uncontrolled   . BPH (benign prostatic hyperplasia)   . Other testicular hypofunction    Allergies:  Allergies  Allergen Reactions  . Penicillins      Review of Systems:  Review of Systems  Constitutional: Positive for weight loss (has been trying to lose weight). Negative for fever, chills, malaise/fatigue and diaphoresis.  HENT: Negative.   Respiratory: Negative.   Cardiovascular: Negative.   Gastrointestinal: Negative.   Genitourinary: Positive for urgency (if he holds it will have urgency, occ dribbling and nocturia x1-2). Negative for dysuria, frequency, hematuria and flank pain.  Musculoskeletal: Positive for joint pain (bilateral knees). Negative for myalgias, back pain, falls and neck pain.  Skin: Negative.   Neurological: Positive for tingling (feet and some in hands at night). Negative for dizziness, tremors, sensory change, speech change, focal weakness, seizures, loss of consciousness and weakness.  Psychiatric/Behavioral: Negative.     Family history- Review and unchanged Social history- Review and unchanged Physical Exam: BP 110/70 mmHg  Pulse 56  Temp(Src) 97.6 F (36.4 C)  Resp 16  Wt 189 lb (85.73 kg) Wt Readings from Last 3 Encounters:  11/05/14 189 lb (85.73 kg)  07/10/14 196 lb 3.2 oz (88.996 kg)  06/09/14 197 lb (89.359 kg)   General Appearance: Well nourished, in no apparent distress. Eyes: PERRLA, EOMs, conjunctiva no swelling or erythema Sinuses: No Frontal/maxillary tenderness ENT/Mouth: Ext aud canals clear, TMs without erythema, bulging. No erythema, swelling, or exudate on post pharynx.  Tonsils not swollen or erythematous. Hearing normal.  Neck: Supple, thyroid enlarged but improved Respiratory: Respiratory effort normal, BS equal bilaterally without rales, rhonchi, wheezing or stridor.  Cardio: RRR with no MRGs. Brisk peripheral pulses without edema.  Abdomen: Soft, + BS.  Non tender, no guarding,  rebound, hernias, masses. Lymphatics: Non tender without lymphadenopathy.  Musculoskeletal: Full ROM, 5/5 strength, normal gait.  Skin: Warm, dry without rashes, lesions, ecchymosis.  Neuro: Cranial nerves intact. No cerebellar symptoms. sensation decrease to ankle bilateral feet,  Psych: Awake and oriented X 3, normal affect, Insight and Judgment appropriate.    Vicie Mutters, PA-C 8:50 AM [facility]

## 2014-11-05 NOTE — Patient Instructions (Signed)
    Bad carbs also include fruit juice, alcohol, and sweet tea. These are empty calories that do not signal to your brain that you are full.   Please remember the good carbs are still carbs which convert into sugar. So please measure them out no more than 1/2-1 cup of rice, oatmeal, pasta, and beans  Veggies are however free foods! Pile them on.   Not all fruit is created equal. Please see the list below, the fruit at the bottom is higher in sugars than the fruit at the top. Please avoid all dried fruits.    Benign Prostatic Hyperplasia An enlarged prostate (benign prostatic hyperplasia) is common in older men. You may experience the following:  Weak urine stream.  Dribbling.  Feeling like the bladder has not emptied completely.  Difficulty starting urination.  Getting up frequently at night to urinate.  Urinating more frequently during the day. HOME CARE INSTRUCTIONS  Monitor your prostatic hyperplasia for any changes. The following actions may help to alleviate any discomfort you are experiencing:  Give yourself time when you urinate.  Stay away from alcohol.  Avoid beverages containing caffeine, such as coffee, tea, and colas, because they can make the problem worse.  Avoid decongestants, antihistamines, and some prescription medicines that can make the problem worse.  Follow up with your health care provider for further treatment as recommended. SEEK MEDICAL CARE IF:  You are experiencing progressive difficulty voiding.  Your urine stream is progressively getting narrower.  You are awaking from sleep with the urge to void more frequently.  You are constantly feeling the need to void.  You experience loss of urine, especially in small amounts. SEEK IMMEDIATE MEDICAL CARE IF:   You develop increased pain with urination or are unable to urinate.  You develop severe abdominal pain, vomiting, a high fever, or fainting.  You develop back pain or blood in your  urine. MAKE SURE YOU:   Understand these instructions.  Will watch your condition.  Will get help right away if you are not doing well or get worse. Document Released: 11/07/2005 Document Revised: 07/10/2013 Document Reviewed: 04/09/2013 [facility] Patient Information 2015 [place], [place]. This information is not intended to replace advice given to you by your health care provider. Make sure you discuss any questions you have with your health care provider.

## 2014-11-06 ENCOUNTER — Ambulatory Visit: Payer: Medicare Other

## 2014-11-06 LAB — BASIC METABOLIC PANEL WITH GFR
BUN: 21 mg/dL (ref 6–23)
CO2: 25 meq/L (ref 19–32)
Calcium: 10 mg/dL (ref 8.4–10.5)
Chloride: 104 mEq/L (ref 96–112)
Creat: 1.11 mg/dL (ref 0.50–1.35)
GFR, EST AFRICAN AMERICAN: 76 mL/min
GFR, Est Non African American: 66 mL/min
Glucose, Bld: 144 mg/dL — ABNORMAL HIGH (ref 70–99)
Potassium: 4.3 mEq/L (ref 3.5–5.3)
SODIUM: 140 meq/L (ref 135–145)

## 2014-11-06 LAB — LIPID PANEL
Cholesterol: 103 mg/dL (ref 0–200)
HDL: 56 mg/dL (ref >39)
LDL CALC: 39 mg/dL (ref 0–99)
TRIGLYCERIDES: 41 mg/dL (ref <150)
Total CHOL/HDL Ratio: 1.8 Ratio
VLDL: 8 mg/dL (ref 0–40)

## 2014-11-06 LAB — HEPATIC FUNCTION PANEL
ALT: 13 U/L (ref 0–53)
AST: 18 U/L (ref 0–37)
Albumin: 4 g/dL (ref 3.5–5.2)
Alkaline Phosphatase: 56 U/L (ref 39–117)
BILIRUBIN DIRECT: 0.2 mg/dL (ref 0.0–0.3)
Indirect Bilirubin: 0.5 mg/dL (ref 0.2–1.2)
Total Bilirubin: 0.7 mg/dL (ref 0.2–1.2)
Total Protein: 6.5 g/dL (ref 6.0–8.3)

## 2014-11-06 LAB — VITAMIN D 25 HYDROXY (VIT D DEFICIENCY, FRACTURES): Vit D, 25-Hydroxy: 79 ng/mL (ref 30–100)

## 2014-11-06 LAB — INSULIN, FASTING: Insulin fasting, serum: 8.2 u[IU]/mL (ref 2.0–19.6)

## 2014-11-06 LAB — URINALYSIS, ROUTINE W REFLEX MICROSCOPIC
BILIRUBIN URINE: NEGATIVE
Glucose, UA: NEGATIVE mg/dL
HGB URINE DIPSTICK: NEGATIVE
Ketones, ur: NEGATIVE mg/dL
Leukocytes, UA: NEGATIVE
Nitrite: NEGATIVE
PROTEIN: NEGATIVE mg/dL
Specific Gravity, Urine: 1.013 (ref 1.005–1.030)
Urobilinogen, UA: 0.2 mg/dL (ref 0.0–1.0)
pH: 5.5 (ref 5.0–8.0)

## 2014-11-06 LAB — MAGNESIUM: Magnesium: 1.9 mg/dL (ref 1.5–2.5)

## 2014-11-06 LAB — HEMOGLOBIN A1C
Hgb A1c MFr Bld: 6.6 % — ABNORMAL HIGH (ref <5.7)
MEAN PLASMA GLUCOSE: 143 mg/dL — AB (ref <117)

## 2014-11-06 LAB — TSH: TSH: 0.862 u[IU]/mL (ref 0.350–4.500)

## 2014-11-07 LAB — URINE CULTURE
Colony Count: NO GROWTH
ORGANISM ID, BACTERIA: NO GROWTH

## 2014-11-12 ENCOUNTER — Telehealth: Payer: Self-pay | Admitting: Oncology

## 2014-11-12 ENCOUNTER — Ambulatory Visit (HOSPITAL_BASED_OUTPATIENT_CLINIC_OR_DEPARTMENT_OTHER): Payer: Medicare Other | Admitting: Oncology

## 2014-11-12 VITALS — BP 150/68 | HR 60 | Temp 97.6°F | Resp 18 | Ht 71.75 in | Wt 192.9 lb

## 2014-11-12 DIAGNOSIS — D472 Monoclonal gammopathy: Secondary | ICD-10-CM

## 2014-11-12 NOTE — Progress Notes (Signed)
Hematology and Oncology Follow Up Visit  Earl [name] [id number] 10/08/1941 73 y.o. 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 He has normal quantitative immunoglobulins, very small M spike of 0.24 g/dL and normal light chains.  Prior Therapy: Status post bone marrow biopsy on 02/14/2003 which showed 2% plasma cell infiltration.  Current therapy: Observation and surveillance on an annual basis.  Interim History:  Earl [name] presents today for a follow-up visit. Since the last visit a year ago, he reports no issues. He continues to be completely asymptomatic at this time. He has not reported any arthralgias or myalgias. He has not reported any opportunistic infections. He does not report any peripheral neuropathy or constitutional symptoms. He does not report any headaches or blurry vision or syncope. He does not report any fevers, chills or sweats. He does not report any chest pain, shortness of breath or difficulty breathing. He does not report any nausea, vomiting or abdominal pain. He does not report any frequency urgency or hesitancy. He does not report any skeletal complaints. Rest of his review of systems unremarkable.  Medications: I have reviewed the patient's current medications.  Current Outpatient Prescriptions  Medication Sig Dispense Refill  . aspirin 81 MG tablet Take 81 mg by mouth daily.    [name] atenolol (TENORMIN) 100 MG tablet Take 1 tablet (100 mg total) by mouth daily. 90 tablet 11  . Cholecalciferol (VITAMIN D PO) Take 5,000 Units by mouth daily.    . cyclobenzaprine (FLEXERIL) 10 MG tablet Take 1 tablet (10 mg total) by mouth at bedtime as needed for muscle spasms (for jaw pain). 60 tablet 1  . finasteride (PROSCAR) 5 MG tablet Take 1 tablet by mouth  every day for prostate 90 tablet 3  . gabapentin (NEURONTIN) 300 MG capsule  1-2 at night for pain 180 capsule 3  . glipiZIDE (GLUCOTROL) 5 MG tablet Take 1 tablet by mouth 3   times a day before meals   for diabetes 270 tablet 99  . hydrochlorothiazide (HYDRODIURIL) 25 MG tablet Take 1 tablet by mouth  every day for blood  pressure and fluid 90 tablet 3  . levothyroxine (SYNTHROID, LEVOTHROID) 50 MCG tablet Take 1 tablet (50 mcg total) by mouth daily before breakfast. 90 tablet 3  . Magnesium 250 MG TABS Take 250 mg by mouth daily.    . metFORMIN (GLUCOPHAGE-XR) 500 MG 24 hr tablet Take 1 tablet by mouth 4  times daily for diabetes 360 tablet 99  . Multiple Vitamin (MULTIVITAMIN) capsule Take 1 capsule by mouth daily.      . potassium chloride (KLOR-CON) 10 MEQ CR tablet Take 10 mEq by mouth daily.      . quinapril (ACCUPRIL) 40 MG tablet Take 1 tablet by mouth  every day for blood  pressure 90 tablet 3  . simvastatin (ZOCOR) 40 MG tablet Take 1 tablet at bedtime  or as directed for cholesterol 90 tablet 3  . vitamin C (ASCORBIC ACID) 500 MG tablet Take 500 mg by mouth daily.       No current facility-administered medications for this visit.     Allergies:  Allergies  Allergen Reactions  . Penicillins     Past Medical History, Surgical history, Social history, and Family History were reviewed and updated.   Physical Exam: Blood pressure 150/68, pulse 60, temperature 97.6 F (36.4 C), temperature source Oral, resp. rate  18, height 5' 11.75" (1.822 m), weight 192 lb 14.4 oz (87.499 kg), SpO2 100 %. ECOG: 0 General appearance: alert and cooperative Head: Normocephalic, without obvious abnormality Neck: no adenopathy Lymph nodes: Cervical, supraclavicular, and axillary nodes normal. Heart:regular rate and rhythm, S1, S2 normal, no murmur, click, rub or gallop Lung:chest clear, no wheezing, rales, normal symmetric air entry Abdomin: soft, non-tender, without masses or organomegaly EXT:no erythema, induration, or nodules   Lab Results: Lab Results   Component Value Date   WBC 5.8 11/05/2014   HGB 13.0 11/05/2014   HCT 37.2* 11/05/2014   MCV 82.1 11/05/2014   PLT 182 11/05/2014     Chemistry      Component Value Date/Time   NA 140 11/05/2014 0913   NA 141 11/03/2014 0809   K 4.3 11/05/2014 0913   K 3.9 11/03/2014 0809   CL 104 11/05/2014 0913   CL 101 10/23/2012 0802   CO2 25 11/05/2014 0913   CO2 25 11/03/2014 0809   BUN 21 11/05/2014 0913   BUN 16.2 11/03/2014 0809   CREATININE 1.11 11/05/2014 0913   CREATININE 1.2 11/03/2014 0809   CREATININE 1.3 09/18/2012 1252      Component Value Date/Time   CALCIUM 10.0 11/05/2014 0913   CALCIUM 9.6 11/03/2014 0809   ALKPHOS 56 11/05/2014 0913   ALKPHOS 59 11/03/2014 0809   AST 18 11/05/2014 0913   AST 21 11/03/2014 0809   ALT 13 11/05/2014 0913   ALT 19 11/03/2014 0809   BILITOT 0.7 11/05/2014 0913   BILITOT 0.66 11/03/2014 0809     Results for Earl [name] (MRN [id number]) as of 11/12/2014 15:21  Ref. Range 10/31/2013 15:05 11/03/2014 08:09  M-SPIKE, % No range found  0.24  SPE Interp. No range found  *  IgG (Immunoglobin G), Serum Latest Range: [phone] mg/dL 537 (L) 581 (L)  IgA Latest Range: 68-379 mg/dL 309 298  IgM, Serum Latest Range: 41-251 mg/dL 15 (L) 14 (L)  Total Protein, Serum Electrophoresis Latest Range: 6.0-8.3 g/dL 6.0 6.3  Kappa free light chain Latest Range: 0.33-1.94 mg/dL 1.14 1.01  Lambda Free Lght Chn Latest Range: 0.57-2.63 mg/dL 1.65 2.09  Kappa:Lambda Ratio Latest Range: 0.26-1.65  0.69 0.48    Impression and Plan:   74 year old gentleman with the following issues:  1. IgA lambda: MGUS: His protein studies have not really changed and has not had any evidence to suggest end organ damage. Plan is to continue with active surveillance on an annual basis.   2. DM2: Continue glyburide 5 mg daily, metformin 500 mg four times daily, per PCP.  3. Hypertension: Continue Hydrochlorothiazide 25 mg daily; Quinapril 40 mg daily.   4.  Elevated Creatinine: This have resolved at this time and his kidney function within normal range.  5. Follow-up: Will be in one year sooner if there is a problem.  [facility], MD 12/23/20153:28 PM

## 2014-11-12 NOTE — Telephone Encounter (Signed)
gv and printed appt sched and avs for pt for DEC 2016 °

## 2015-01-05 ENCOUNTER — Encounter: Payer: Self-pay | Admitting: Internal Medicine

## 2015-02-08 ENCOUNTER — Encounter: Payer: Self-pay | Admitting: Internal Medicine

## 2015-02-08 NOTE — Patient Instructions (Signed)
 Recommend the book "The END of DIETING" by Dr Joel Fuhrman   & the book "The END of DIABETES " by Dr Joel Fuhrman  At [employer] - get book & Audio CD's      Being diabetic has a  300% increased risk for heart attack, stroke, cancer, and alzheimer- type vascular dementia. It is very important that you work harder with diet by avoiding all foods that are white. Avoid white rice (brown & wild rice is OK), white potatoes (sweetpotatoes in moderation is OK), White bread or wheat bread or anything made out of white flour like bagels, donuts, rolls, buns, biscuits, cakes, pastries, cookies, pizza crust, and pasta (made from white flour & egg whites) - vegetarian pasta or spinach or wheat pasta is OK. Multigrain breads like Arnold's or [employer], or multigrain sandwich thins or flatbreads.  Diet, exercise and weight loss can reverse and cure diabetes in the early stages.  Diet, exercise and weight loss is very important in the control and prevention of complications of diabetes which affects every system in your body, ie. Brain - dementia/stroke, eyes - glaucoma/blindness, heart - heart attack/heart failure, kidneys - dialysis, stomach - gastric paralysis, intestines - malabsorption, nerves - severe painful neuritis, circulation - gangrene & loss of a leg(s), and finally cancer and Alzheimers.    I recommend avoid fried & greasy foods,  sweets/candy, white rice (brown or wild rice or Quinoa is OK), white potatoes (sweet potatoes are OK) - anything made from white flour - bagels, doughnuts, rolls, buns, biscuits,white and wheat breads, pizza crust and traditional pasta made of white flour & egg white(vegetarian pasta or spinach or wheat pasta is OK).  Multi-grain bread is OK - like multi-grain flat bread or sandwich thins. Avoid alcohol in excess. Exercise is also important.    Eat all the vegetables you want - avoid meat, especially red meat and dairy - especially cheese.  Cheese is the most  concentrated form of trans-fats which is the worst thing to clog up our arteries. Veggie cheese is OK which can be found in the fresh produce section at [employer] or [employer] or [employer]  Preventive Care for Adults A healthy lifestyle and preventive care can promote health and wellness. Preventive health guidelines for men include the following key practices:  A routine yearly physical is a good way to check with your health care provider about your health and preventative screening. It is a chance to share any concerns and updates on your health and to receive a thorough exam.  Visit your dentist for a routine exam and preventative care every 6 months. Brush your teeth twice a day and floss once a day. Good oral hygiene prevents tooth decay and gum disease.  The frequency of eye exams is based on your age, health, family medical history, use of contact lenses, and other factors. Follow your health care provider's recommendations for frequency of eye exams.  Eat a healthy diet. Foods such as vegetables, fruits, whole grains, low-fat dairy products, and lean protein foods contain the nutrients you need without too many calories. Decrease your intake of foods high in solid fats, added sugars, and salt. Eat the right amount of calories for you.Get information about a proper diet from your health care provider, if necessary.  Regular physical exercise is one of the most important things you can do for your health. Most adults should get at least 150 minutes of moderate-intensity exercise (any activity that increases your heart rate   and causes you to sweat) each week. In addition, most adults need muscle-strengthening exercises on 2 or more days a week.  Maintain a healthy weight. The body mass index (BMI) is a screening tool to identify possible weight problems. It provides an estimate of body fat based on height and weight. Your health care provider can find your BMI and can help you achieve or  maintain a healthy weight.For adults 20 years and older:  A BMI below 18.5 is considered underweight.  A BMI of 18.5 to 24.9 is normal.  A BMI of 25 to 29.9 is considered overweight.  A BMI of 30 and above is considered obese.  Maintain normal blood lipids and cholesterol levels by exercising and minimizing your intake of saturated fat. Eat a balanced diet with plenty of fruit and vegetables. Blood tests for lipids and cholesterol should begin at age 20 and be repeated every 5 years. If your lipid or cholesterol levels are high, you are over 50, or you are at high risk for heart disease, you may need your cholesterol levels checked more frequently.Ongoing high lipid and cholesterol levels should be treated with medicines if diet and exercise are not working.  If you smoke, find out from your health care provider how to quit. If you do not use tobacco, do not start.  Lung cancer screening is recommended for adults aged 55-80 years who are at high risk for developing lung cancer because of a history of smoking. A yearly low-dose CT scan of the lungs is recommended for people who have at least a 30-pack-year history of smoking and are a current smoker or have quit within the past 15 years. A pack year of smoking is smoking an average of 1 pack of cigarettes a day for 1 year (for example: 1 pack a day for 30 years or 2 packs a day for 15 years). Yearly screening should continue until the smoker has stopped smoking for at least 15 years. Yearly screening should be stopped for people who develop a health problem that would prevent them from having lung cancer treatment.  If you choose to drink alcohol, do not have more than 2 drinks per day. One drink is considered to be 12 ounces (355 mL) of beer, 5 ounces (148 mL) of wine, or 1.5 ounces (44 mL) of liquor.  Avoid use of street drugs. Do not share needles with anyone. Ask for help if you need support or instructions about stopping the use of  drugs.  High blood pressure causes heart disease and increases the risk of stroke. Your blood pressure should be checked at least every 1-2 years. Ongoing high blood pressure should be treated with medicines, if weight loss and exercise are not effective.  If you are 45-79 years old, ask your health care provider if you should take aspirin to prevent heart disease.  Diabetes screening involves taking a blood sample to check your fasting blood sugar level. Testing should be considered at a younger age or be carried out more frequently if you are overweight and have at least 1 risk factor for diabetes.  Colorectal cancer can be detected and often prevented. Most routine colorectal cancer screening begins at the age of 50 and continues through age 75. However, your health care provider may recommend screening at an earlier age if you have risk factors for colon cancer. On a yearly basis, your health care provider may provide home test kits to check for hidden blood in the stool. Use of   a small camera at the end of a tube to directly examine the colon (sigmoidoscopy or colonoscopy) can detect the earliest forms of colorectal cancer. Talk to your health care provider about this at age 50, when routine screening begins. Direct exam of the colon should be repeated every 5-10 years through age 75, unless early forms of precancerous polyps or small growths are found.  Hepatitis C blood testing is recommended for all people born from 1945 through 1965 and any individual with known risks for hepatitis C.  Screening for abdominal aortic aneurysm (AAA)  by ultrasound is recommended for people who have history of high blood pressure or who are current or former smokers.  Healthy men should  receive prostate-specific antigen (PSA) blood tests as part of routine cancer screening. Talk with your health care provider about prostate cancer screening.  Testicular cancer screening is  recommended for adult males.  Screening includes self-exam, a health care provider exam, and other screening tests. Consult with your health care provider about any symptoms you have or any concerns you have about testicular cancer.  Use sunscreen. Apply sunscreen liberally and repeatedly throughout the day. You should seek shade when your shadow is shorter than you. Protect yourself by wearing long sleeves, pants, a wide-brimmed hat, and sunglasses year round, whenever you are outdoors.  Once a month, do a whole-body skin exam, using a mirror to look at the skin on your back. Tell your health care provider about new moles, moles that have irregular borders, moles that are larger than a pencil eraser, or moles that have changed in shape or color.  Stay current with required vaccines (immunizations).  Influenza vaccine. All adults should be immunized every year.  Tetanus, diphtheria, and acellular pertussis (Td, Tdap) vaccine. An adult who has not previously received Tdap or who does not know his vaccine status should receive 1 dose of Tdap. This initial dose should be followed by tetanus and diphtheria toxoids (Td) booster doses every 10 years. Adults with an unknown or incomplete history of completing a 3-dose immunization series with Td-containing vaccines should begin or complete a primary immunization series including a Tdap dose. Adults should receive a Td booster every 10 years.  Zoster vaccine. One dose is recommended for adults aged 60 years or older unless certain conditions are present.    PREVNAR - Pneumococcal 13-valent conjugate (PCV13) vaccine. When indicated, a person who is uncertain of his immunization history and has no record of immunization should receive the PCV13 vaccine. An adult aged 19 years or older who has certain medical conditions and has not been previously immunized should receive 1 dose of PCV13 vaccine. This PCV13 should be followed with a dose of pneumococcal polysaccharide (PPSV23) vaccine. The  PPSV23 vaccine dose should be obtained at least 8 weeks after the dose of PCV13 vaccine. An adult aged 19 years or older who has certain medical conditions and previously received 1 or more doses of PPSV23 vaccine should receive 1 dose of PCV13. The PCV13 vaccine dose should be obtained 1 or more years after the last PPSV23 vaccine dose.    PNEUMOVAX - Pneumococcal polysaccharide (PPSV23) vaccine. When PCV13 is also indicated, PCV13 should be obtained first. All adults aged 65 years and older should be immunized. An adult younger than age 65 years who has certain medical conditions should be immunized. Any person who resides in a nursing home or long-term care facility should be immunized. An adult smoker should be immunized. People with an immunocompromised condition   and certain other conditions should receive both PCV13 and PPSV23 vaccines. People with human immunodeficiency virus (HIV) infection should be immunized as soon as possible after diagnosis. Immunization during chemotherapy or radiation therapy should be avoided. Routine use of PPSV23 vaccine is not recommended for American Indians, [place] Natives, or people younger than 65 years unless there are medical conditions that require PPSV23 vaccine. When indicated, people who have unknown immunization and have no record of immunization should receive PPSV23 vaccine. One-time revaccination 5 years after the first dose of PPSV23 is recommended for people aged 19-64 years who have chronic kidney failure, nephrotic syndrome, asplenia, or immunocompromised conditions. People who received 1-2 doses of PPSV23 before age 65 years should receive another dose of PPSV23 vaccine at age 65 years or later if at least 5 years have passed since the previous dose. Doses of PPSV23 are not needed for people immunized with PPSV23 at or after age 65 years.    Hepatitis A vaccine. Adults who wish to be protected from this disease, have certain high-risk conditions, work  with hepatitis A-infected animals, work in hepatitis A research labs, or travel to or work in countries with a high rate of hepatitis A should be immunized. Adults who were previously unvaccinated and who anticipate close contact with an international adoptee during the first 60 days after arrival in the [place] from a country with a high rate of hepatitis A should be immunized.    Hepatitis B vaccine. Adults should be immunized if they wish to be protected from this disease, have certain high-risk conditions, may be exposed to blood or other infectious body fluids, are household contacts or sex partners of hepatitis B positive people, are clients or workers in certain care facilities, or travel to or work in countries with a high rate of hepatitis B.   Preventive Service / Frequency   Ages 65 and over  Blood pressure check.  Lipid and cholesterol check.  Lung cancer screening. / Every year if you are aged 55-80 years and have a 30-pack-year history of smoking and currently smoke or have quit within the past 15 years. Yearly screening is stopped once you have quit smoking for at least 15 years or develop a health problem that would prevent you from having lung cancer treatment.  Fecal occult blood test (FOBT) of stool. You may not have to do this test if you get a colonoscopy every 10 years.  Flexible sigmoidoscopy** or colonoscopy.** / Every 5 years for a flexible sigmoidoscopy or every 10 years for a colonoscopy beginning at age 50 and continuing until age 75.  Hepatitis C blood test.** / For all people born from 1945 through 1965 and any individual with known risks for hepatitis C.  Abdominal aortic aneurysm (AAA) screening./ Screening current or former smokers or have Hypertension.  Skin self-exam. / Monthly.  Influenza vaccine. / Every year.  Tetanus, diphtheria, and acellular pertussis (Tdap/Td) vaccine.** / 1 dose of Td every 10 years.   Zoster vaccine.** / 1 dose for  adults aged 60 years or older.         Pneumococcal 13-valent conjugate (PCV13) vaccine.    Pneumococcal polysaccharide (PPSV23) vaccine.     Hepatitis A vaccine.** / Consult your health care provider.  Hepatitis B vaccine.** / Consult your health care provider. Screening for abdominal aortic aneurysm (AAA)  by ultrasound is recommended for people who have history of high blood pressure or who are current or former smokers. 

## 2015-02-08 NOTE — Progress Notes (Signed)
Patient ID: Earl [name], male   DOB: 1941/09/09, 74 y.o.   MRN: [id number]   MEDICARE ANNUAL WELLNESS VISIT AND CPE  Assessment:   1. Essential hypertension  - EKG 12-Lead - [place], RETROPERITNL ABD,  LTD - TSH  2. Hyperlipidemia  - Lipid panel  3. Type 2 diabetes mellitus with diabetic chronic kidney disease  - Microalbumin / creatinine urine ratio - HM DIABETES FOOT EXAM - LOW EXTREMITY NEUR EXAM DOCUM - Hemoglobin A1c - Insulin, random  4. Vitamin D deficiency  - Vit D  25 hydroxy   5. Testosterone deficiency  - Testosterone  6. Paroxysmal atrial fibrillation   7. MGUS (monoclonal gammopathy of unknown significance)   8. Depression screen   9. At low risk for fall   10. Screening for rectal cancer  - POC Hemoccult Bld/Stl   11. Prostate cancer screening  - PSA  12. Anemia, iron deficiency  - Iron and TIBC  13. Medication management  - Urine Microscopic - CBC with Differential/Platelet - BASIC METABOLIC PANEL WITH GFR - Hepatic function panel - Magnesium  14. Routine general medical examination at a health care facility   15. Need for prophylactic vaccination with tetanus-diphtheria (TD)  - DT Vaccine greater than 7yo IM  Plan:   During the course of the visit the patient was educated and counseled about appropriate screening and preventive services including:    Pneumococcal vaccine   Influenza vaccine  Td vaccine  Screening electrocardiogram  Bone densitometry screening  Colorectal cancer screening  Diabetes screening  Glaucoma screening  Nutrition counseling   Advanced directives: requested  Screening recommendations, referrals: Vaccinations: Immunization History  Administered Date(s) Administered  . Influenza Split 08/07/2013  . Influenza, High Dose Seasonal PF 08/19/2014  . Pneumococcal Polysaccharide-23 08/13/2008  . Td 11/22/2003  Prevnar vaccine ordered Shingles vaccine undecided Hep B vaccine not  indicated  Nutrition assessed and recommended  Colonoscopy 09/07/2012 Recommended yearly ophthalmology/optometry visit for glaucoma screening and checkup Recommended yearly dental visit for hygiene and checkup Advanced directives - yes  Conditions/risks identified: BMI: Discussed weight loss, diet, and increase physical activity.  Increase physical activity: AHA recommends 150 minutes of physical activity a week.  Medications reviewed Diabetes is at goal, ACE/ARB therapy: Yes. Urinary Incontinence is not an issue: discussed non pharmacology and pharmacology options.  Fall risk: low- discussed PT, home fall assessment, medications.   Subjective:    Earl [name]  presents for Medicare Annual Wellness Visit and complete physical.  Date of last medicare wellness visit was 04/07/2014.  This very nice 74 y.o. MBM presents for complete physical.  Patient has been followed for HTN, T2_NIDDM w/CKD2, Hyperlipidemia, and Vitamin D Deficiency.   HTN predates since     . Patient's BP has been controlled at home.Today's  . Patient denies any cardiac symptoms as chest pain, palpitations, shortness of breath, dizziness or ankle swelling.   Patient's hyperlipidemia is controlled with diet and medications. Patient denies myalgias or other medication SE's. Last lipids were  At goal Chol 103; HDL 56; LDL 39; Trig 41 on 11/05/2014.   Patient has T2_NIDDM w/CKD2 since    and patient denies reactive hypoglycemic symptoms, visual blurring, diabetic polys or paresthesias. Last A1c was  6.6 % on 11/05/2014.     Finally, patient has history of Vitamin D Deficiency of    and last vitamin D was 79 on 11/05/2014.  Names of Other Physician/Practitioners you currently use: 1. [facility] here for primary care  2. Dr Delman Cheadle, eye doctor, last visit Oct 2015 3. Dr Ennis Forts, [place], dentist, last visit q6 months  Patient Care Team: [name], MD as PCP - General (Internal  Medicine) Inda Castle, MD as Consulting Physician (Gastroenterology) Wyatt Portela, MD as Consulting Physician (Oncology) Sharyne Peach, MD as Consulting Physician (Ophthalmology)  Medication Review: Medication Sig  . aspirin 81 MG tablet Take 81 mg by mouth daily.  [name] atenolol (TENORMIN) 100 MG tablet Take 1 tablet (100 mg total) by mouth daily.  . Cholecalciferol (VITAMIN D PO) Take 5,000 Units by mouth daily.  . finasteride (PROSCAR) 5 MG tablet Take 1 tablet by mouth  every day for prostate  . gabapentin (NEURONTIN) 300 MG capsule 1-2 at night for pain  . glipiZIDE (GLUCOTROL) 5 MG tablet Take 1 tablet by mouth 3   times a day before meals   for diabetes  . hydrochlorothiazide (HYDRODIURIL) 25 MG tablet Take 1 tablet by mouth  every day for blood  pressure and fluid  . levothyroxine (SYNTHROID, LEVOTHROID) 50 MCG tablet Take 1 tablet (50 mcg total) by mouth daily before breakfast.  . Magnesium 250 MG TABS Take 250 mg by mouth daily.  . metFORMIN (GLUCOPHAGE-XR) 500 MG 24 hr tablet Take 1 tablet by mouth 4  times daily for diabetes  . Multiple Vitamin (MULTIVITAMIN) capsule Take 1 capsule by mouth daily.    . potassium chloride (KLOR-CON) 10 MEQ CR tablet Take 10 mEq by mouth daily.    . quinapril (ACCUPRIL) 40 MG tablet Take 1 tablet by mouth  every day for blood  pressure  . simvastatin (ZOCOR) 40 MG tablet Take 1 tablet at bedtime  or as directed for cholesterol  . vitamin C (ASCORBIC ACID) 500 MG tablet Take 500 mg by mouth daily.     Current Problems (verified) Patient Active Problem List   Diagnosis Date Noted  . Vitamin D deficiency 04/29/2014  . Medication management 01/03/2014  . T2_NIDDM w/CKD2   . BPH (benign prostatic hyperplasia)   . Testosterone deficiency   . 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 Anemia, unspecified 11/07/2013  . Hypertension 11/07/2013  . Hyperlipidemia 11/07/2013  . Paroxysmal atrial fibrillation 09/18/2012    Screening Tests Health Maintenance  Topic Date Due  . OPHTHALMOLOGY EXAM  04/29/1951  . ZOSTAVAX  04/28/2001  . PNA vac Low Risk Adult (2 of 2 - PCV13) 08/13/2009  . TETANUS/TDAP  11/21/2013  . URINE MICROALBUMIN  01/03/2015  . HEMOGLOBIN A1C  05/07/2015  . INFLUENZA VACCINE  06/22/2015  . FOOT EXAM  02/09/2016  . COLONOSCOPY  09/07/2017   Immunization History  Administered Date(s) Administered  . Influenza Split 08/07/2013  . Influenza, High Dose Seasonal PF 08/19/2014  . Pneumococcal Polysaccharide-23 08/13/2008  . Td 11/22/2003   Preventative care: Last colonoscopy: 09/07/2012  History reviewed: allergies, current medications, past family history, past medical history, past social history, past surgical history and problem list  Risk Factors: Tobacco History  Substance Use Topics  . Smoking status: Never Smoker   . Smokeless tobacco: Never Used  . Alcohol Use: No   He does not smoke.  Patient is not a former smoker. Are there smokers in your home (other than you)?  No  Alcohol Current alcohol use: none  Caffeine Current caffeine use: denies use  Exercise Current exercise: bicycling, walking and yard work  Nutrition/Diet Current diet: in general, a "healthy" diet    Cardiac risk factors: advanced age (older than 5  for men, 39 for women), diabetes mellitus, dyslipidemia, hypertension and male gender.  Depression Screen (Note: if answer to either of the following is "Yes", a more complete depression screening is indicated)   Q1: Over the past two weeks, have you felt down, depressed or hopeless? No  Q2: Over the past two weeks, have you felt little interest or pleasure in doing things? No  Have you lost interest or pleasure in daily life? No  Do you often feel hopeless? No  Do you cry easily over simple problems? No  Activities of Daily Living In your present state of health, do you have any difficulty performing the following activities?:  Driving?  No Managing money?  No Feeding yourself? No Getting from bed to chair? No Climbing a flight of stairs? No Preparing food and eating?: No Bathing or showering? No Getting dressed: No Getting to the toilet? No Using the toilet:No Moving around from place to place: No In the past year have you fallen or had a near fall?:No   Are you sexually active?  Yes  Do you have more than one partner?  No  Vision Difficulties: No  Hearing Difficulties: No Do you often ask people to speak up or repeat themselves? No Do you experience ringing or noises in your ears? No Do you have difficulty understanding soft or whispered voices? No  Cognition  Do you feel that you have a problem with memory?No  Do you often misplace items? No  Do you feel safe at home?  Yes  Advanced directives Does patient have a [place]? Yes Does patient have a Living Will? Yes  Past Medical History  Diagnosis Date  . Diabetes mellitus   . Hypertension   . Hyperlipidemia   . Atrial fibrillation   . Type II or unspecified type diabetes mellitus without mention of complication, not stated as uncontrolled   . BPH (benign prostatic hyperplasia)   . Other testicular hypofunction    Past Surgical History  Procedure Laterality Date  . Inguinal hernia repair      1968 and 2004   ROS: Constitutional: Denies fever, chills, weight loss/gain, headaches, insomnia, fatigue, night sweats or change in appetite. Eyes: Denies redness, blurred vision, diplopia, discharge, itchy or watery eyes.  ENT: Denies discharge, congestion, post nasal drip, epistaxis, sore throat, earache, hearing loss, dental pain, Tinnitus, Vertigo, Sinus pain or snoring.  Cardio: Denies chest pain, palpitations, irregular heartbeat, syncope, dyspnea, diaphoresis, orthopnea, PND, claudication or edema Respiratory: denies cough, dyspnea, DOE, pleurisy, hoarseness, laryngitis or wheezing.  Gastrointestinal: Denies dysphagia,  heartburn, reflux, water brash, pain, cramps, nausea, vomiting, bloating, diarrhea, constipation, hematemesis, melena, hematochezia, jaundice or hemorrhoids Genitourinary: Denies dysuria, frequency, urgency, nocturia, hesitancy, discharge, hematuria or flank pain Musculoskeletal: Denies arthralgia, myalgia, stiffness, Jt. Swelling, pain, limp or strain/sprain. Denies Falls. Skin: Denies puritis, rash, hives, warts, acne, eczema or change in skin lesion Neuro: No weakness, tremor, incoordination, spasms, paresthesia or pain Psychiatric: Denies confusion, memory loss or sensory loss. Denies Depression. Endocrine: Denies change in weight, skin, hair change, nocturia, and paresthesia, diabetic polys, visual blurring or hyper / hypo glycemic episodes.  Heme/Lymph: No excessive bleeding, bruising or enlarged lymph nodes.  Objective:     BP 126/68 mmHg  Pulse 52  Temp(Src) 97.6 F (36.4 C)  Resp 16  Ht 5\' 11"  (1.803 m)  Wt 186 lb 6.4 oz (84.55 kg)  BMI 26.01 kg/m2  General Appearance:  Alert  WD/WN, male , in no apparent distress. Eyes:  PERRLA, EOMs, conjunctiva no swelling or erythema, normal fundi and vessels. Sinuses: No frontal/maxillary tenderness ENT/Mouth: EACs patent / TMs  nl. Nares clear without erythema, swelling, mucoid exudates. Oral hygiene is good. No erythema, swelling, or exudate. Tongue normal, non-obstructing. Tonsils not swollen or erythematous. Hearing normal.  Neck: Supple, thyroid normal. No bruits, nodes or JVD. Respiratory: Respiratory effort normal.  BS equal and clear bilateral without rales, rhonci, wheezing or stridor. Cardio: Heart sounds are normal with regular rate and rhythm and no murmurs, rubs or gallops. Peripheral pulses are normal and equal bilaterally without edema. No aortic or femoral bruits. Chest: symmetric with normal excursions and percussion.  Abdomen: Flat, soft, with nl bowel sounds. Nontender, no guarding, rebound, hernias, masses, or  organomegaly.  Lymphatics: Non tender without lymphadenopathy.  Genitourinary: No hernias.Testes nl. DRE - prostate nl for age - smooth & firm w/o nodules. Musculoskeletal: Full ROM all peripheral extremities, joint stability, 5/5 strength, and normal gait. Skin: Warm and dry without rashes, lesions, cyanosis, clubbing or  ecchymosis.  Neuro: Cranial nerves intact, reflexes equal bilaterally. Normal muscle tone, no cerebellar symptoms. Sensation intact.  Pysch: Awake and oriented X 3 with normal affect, insight and judgment appropriate.   Cognitive Testing  Alert? Yes  Normal Appearance? Yes  Oriented to person? Yes  Place? Yes   Time? Yes  Recall of three objects?  Yes  Can perform simple calculations? Yes  Displays appropriate judgment? Yes  Can read the correct time from a watch/clock? Yes  Medicare Attestation I have personally reviewed: The patient's medical and social history Their use of alcohol, tobacco or illicit drugs Their current medications and supplements The patient's functional ability including ADLs,fall risks, home safety risks, cognitive, and hearing and visual impairment Diet and physical activities Evidence for depression or mood disorders  The patient's weight, height, BMI, and visual acuity have been recorded in the chart.  I have made referrals, counseling, and provided education to the patient based on review of the above and I have provided the patient with a written personalized care plan for preventive services.    Kianna Billet DAVID, MD   02/09/2015

## 2015-02-09 ENCOUNTER — Encounter: Payer: Self-pay | Admitting: Internal Medicine

## 2015-02-09 ENCOUNTER — Ambulatory Visit (INDEPENDENT_AMBULATORY_CARE_PROVIDER_SITE_OTHER): Payer: Medicare Other | Admitting: Internal Medicine

## 2015-02-09 VITALS — BP 126/68 | HR 52 | Temp 97.6°F | Resp 16 | Ht 71.0 in | Wt 186.4 lb

## 2015-02-09 DIAGNOSIS — R6889 Other general symptoms and signs: Secondary | ICD-10-CM | POA: Diagnosis not present

## 2015-02-09 DIAGNOSIS — N189 Chronic kidney disease, unspecified: Secondary | ICD-10-CM

## 2015-02-09 DIAGNOSIS — Z23 Encounter for immunization: Secondary | ICD-10-CM

## 2015-02-09 DIAGNOSIS — I10 Essential (primary) hypertension: Secondary | ICD-10-CM | POA: Diagnosis not present

## 2015-02-09 DIAGNOSIS — Z1331 Encounter for screening for depression: Secondary | ICD-10-CM

## 2015-02-09 DIAGNOSIS — D472 Monoclonal gammopathy: Secondary | ICD-10-CM

## 2015-02-09 DIAGNOSIS — E782 Mixed hyperlipidemia: Secondary | ICD-10-CM

## 2015-02-09 DIAGNOSIS — E1122 Type 2 diabetes mellitus with diabetic chronic kidney disease: Secondary | ICD-10-CM | POA: Diagnosis not present

## 2015-02-09 DIAGNOSIS — Z125 Encounter for screening for malignant neoplasm of prostate: Secondary | ICD-10-CM

## 2015-02-09 DIAGNOSIS — D509 Iron deficiency anemia, unspecified: Secondary | ICD-10-CM | POA: Diagnosis not present

## 2015-02-09 DIAGNOSIS — E291 Testicular hypofunction: Secondary | ICD-10-CM

## 2015-02-09 DIAGNOSIS — Z79899 Other long term (current) drug therapy: Secondary | ICD-10-CM | POA: Diagnosis not present

## 2015-02-09 DIAGNOSIS — Z9181 History of falling: Secondary | ICD-10-CM

## 2015-02-09 DIAGNOSIS — I480 Paroxysmal atrial fibrillation: Secondary | ICD-10-CM

## 2015-02-09 DIAGNOSIS — Z1212 Encounter for screening for malignant neoplasm of rectum: Secondary | ICD-10-CM

## 2015-02-09 DIAGNOSIS — Z0000 Encounter for general adult medical examination without abnormal findings: Secondary | ICD-10-CM

## 2015-02-09 DIAGNOSIS — E559 Vitamin D deficiency, unspecified: Secondary | ICD-10-CM

## 2015-02-09 DIAGNOSIS — Z0001 Encounter for general adult medical examination with abnormal findings: Secondary | ICD-10-CM

## 2015-02-09 DIAGNOSIS — E349 Endocrine disorder, unspecified: Secondary | ICD-10-CM

## 2015-02-09 LAB — CBC WITH DIFFERENTIAL/PLATELET
Basophils Absolute: 0 10*3/uL (ref 0.0–0.1)
Basophils Relative: 0 % (ref 0–1)
EOS ABS: 0.1 10*3/uL (ref 0.0–0.7)
Eosinophils Relative: 2 % (ref 0–5)
HEMATOCRIT: 38.8 % — AB (ref 39.0–52.0)
Hemoglobin: 13.5 g/dL (ref 13.0–17.0)
LYMPHS ABS: 2.6 10*3/uL (ref 0.7–4.0)
LYMPHS PCT: 47 % — AB (ref 12–46)
MCH: 29.5 pg (ref 26.0–34.0)
MCHC: 34.8 g/dL (ref 30.0–36.0)
MCV: 84.9 fL (ref 78.0–100.0)
MONOS PCT: 11 % (ref 3–12)
MPV: 9.7 fL (ref 8.6–12.4)
Monocytes Absolute: 0.6 10*3/uL (ref 0.1–1.0)
Neutro Abs: 2.2 10*3/uL (ref 1.7–7.7)
Neutrophils Relative %: 40 % — ABNORMAL LOW (ref 43–77)
Platelets: 195 10*3/uL (ref 150–400)
RBC: 4.57 MIL/uL (ref 4.22–5.81)
RDW: 13.3 % (ref 11.5–15.5)
WBC: 5.6 10*3/uL (ref 4.0–10.5)

## 2015-02-09 LAB — IRON AND TIBC
%SAT: 34 % (ref 20–55)
Iron: 107 ug/dL (ref 42–165)
TIBC: 316 ug/dL (ref 215–435)
UIBC: 209 ug/dL (ref 125–400)

## 2015-02-09 LAB — BASIC METABOLIC PANEL WITH GFR
BUN: 20 mg/dL (ref 6–23)
CALCIUM: 9.3 mg/dL (ref 8.4–10.5)
CO2: 25 mEq/L (ref 19–32)
CREATININE: 1.19 mg/dL (ref 0.50–1.35)
Chloride: 102 mEq/L (ref 96–112)
GFR, Est African American: 70 mL/min
GFR, Est Non African American: 60 mL/min
GLUCOSE: 136 mg/dL — AB (ref 70–99)
Potassium: 3.9 mEq/L (ref 3.5–5.3)
Sodium: 139 mEq/L (ref 135–145)

## 2015-02-09 LAB — LIPID PANEL
Cholesterol: 116 mg/dL (ref 0–200)
HDL: 49 mg/dL (ref >=40)
LDL CALC: 52 mg/dL (ref 0–99)
TRIGLYCERIDES: 75 mg/dL (ref <150)
Total CHOL/HDL Ratio: 2.4 Ratio
VLDL: 15 mg/dL (ref 0–40)

## 2015-02-09 LAB — HEPATIC FUNCTION PANEL
ALT: 20 U/L (ref 0–53)
AST: 25 U/L (ref 0–37)
Albumin: 4 g/dL (ref 3.5–5.2)
Alkaline Phosphatase: 58 U/L (ref 39–117)
BILIRUBIN DIRECT: 0.2 mg/dL (ref 0.0–0.3)
BILIRUBIN INDIRECT: 0.5 mg/dL (ref 0.2–1.2)
BILIRUBIN TOTAL: 0.7 mg/dL (ref 0.2–1.2)
Total Protein: 6.1 g/dL (ref 6.0–8.3)

## 2015-02-09 LAB — HEMOGLOBIN A1C
Hgb A1c MFr Bld: 6.6 % — ABNORMAL HIGH (ref <5.7)
Mean Plasma Glucose: 143 mg/dL — ABNORMAL HIGH (ref <117)

## 2015-02-09 LAB — MAGNESIUM: Magnesium: 1.8 mg/dL (ref 1.5–2.5)

## 2015-02-09 LAB — TSH: TSH: 1.206 u[IU]/mL (ref 0.350–4.500)

## 2015-02-09 MED ORDER — TETANUS-DIPHTHERIA TOXOIDS TD 5-2 LFU IM INJ: 0.5000 mL | INJECTION | Freq: Once | INTRAMUSCULAR | Status: DC

## 2015-02-10 LAB — MICROALBUMIN / CREATININE URINE RATIO
CREATININE, URINE: 131.1 mg/dL
Microalb Creat Ratio: 10.7 mg/g (ref 0.0–30.0)
Microalb, Ur: 1.4 mg/dL (ref <2.0)

## 2015-02-10 LAB — URINALYSIS, MICROSCOPIC ONLY
Bacteria, UA: NONE SEEN
CASTS: NONE SEEN
CRYSTALS: NONE SEEN
SQUAMOUS EPITHELIAL / LPF: NONE SEEN

## 2015-02-10 LAB — TESTOSTERONE: Testosterone: 310 ng/dL (ref 300–890)

## 2015-02-10 LAB — VITAMIN D 25 HYDROXY (VIT D DEFICIENCY, FRACTURES): VIT D 25 HYDROXY: 65 ng/mL (ref 30–100)

## 2015-02-10 LAB — INSULIN, RANDOM: INSULIN: 5.6 u[IU]/mL (ref 2.0–19.6)

## 2015-02-10 LAB — PSA: PSA: 0.39 ng/mL (ref <=4.00)

## 2015-02-26 ENCOUNTER — Other Ambulatory Visit (INDEPENDENT_AMBULATORY_CARE_PROVIDER_SITE_OTHER): Payer: Medicare Other

## 2015-02-26 DIAGNOSIS — Z1212 Encounter for screening for malignant neoplasm of rectum: Secondary | ICD-10-CM

## 2015-02-26 LAB — POC HEMOCCULT BLD/STL (HOME/3-CARD/SCREEN)
Card #2 Fecal Occult Blod, POC: NEGATIVE
Card #3 Fecal Occult Blood, POC: NEGATIVE
FECAL OCCULT BLD: NEGATIVE

## 2015-03-17 ENCOUNTER — Other Ambulatory Visit: Payer: Self-pay | Admitting: Internal Medicine

## 2015-03-30 ENCOUNTER — Other Ambulatory Visit: Payer: Self-pay | Admitting: Physician Assistant

## 2015-05-05 ENCOUNTER — Encounter: Payer: Self-pay | Admitting: Internal Medicine

## 2015-05-05 ENCOUNTER — Ambulatory Visit (INDEPENDENT_AMBULATORY_CARE_PROVIDER_SITE_OTHER): Payer: Medicare Other | Admitting: Internal Medicine

## 2015-05-05 DIAGNOSIS — M545 Low back pain, unspecified: Secondary | ICD-10-CM

## 2015-05-05 DIAGNOSIS — N390 Urinary tract infection, site not specified: Secondary | ICD-10-CM | POA: Diagnosis not present

## 2015-05-05 MED ORDER — MELOXICAM 7.5 MG PO TABS: 7.5000 mg | ORAL_TABLET | Freq: Every day | ORAL | Status: DC

## 2015-05-05 MED ORDER — DIAZEPAM 2 MG PO TABS: 2.0000 mg | ORAL_TABLET | Freq: Three times a day (TID) | ORAL | Status: DC | PRN

## 2015-05-05 NOTE — Patient Instructions (Signed)

## 2015-05-05 NOTE — Progress Notes (Signed)
Subjective:    Patient ID: Earl [name], male    DOB: Nov 08, 1941, 74 y.o.   MRN: [id number]  Back Pain Pertinent negatives include no abdominal pain, dysuria, fever, numbness or weakness.  Flank Pain Pertinent negatives include no abdominal pain, dysuria, fever, numbness or weakness.   Patient presents to the office for evaluation of right sided lower back pain.  He reports that it has been bothering for approximately a week.  He reports that it is an achey feeling that is much worse at night time when he tried to roll over and also when he sits for a long period of time.  He has been taking aleve and ice at home with some relief.  He has no specific injury that happened.  He reports that remotely 35 years ago using a hoe in a garden.  He reports some mild weakness in his back.  No history of back surgery, no history of broken bones, no history of cancer.     Review of Systems  Constitutional: Negative for fever and chills.  Gastrointestinal: Negative for nausea, vomiting and abdominal pain.  Genitourinary: Positive for flank pain. Negative for dysuria, urgency, frequency, hematuria and difficulty urinating.  Musculoskeletal: Positive for back pain.  Neurological: Negative for dizziness, weakness and numbness.  All other systems reviewed and are negative.      Objective:   Physical Exam  Constitutional: He is oriented to person, place, and time. He appears well-developed and well-nourished. No distress.  HENT:  Head: Normocephalic and atraumatic.  Mouth/Throat: Oropharynx is clear and moist. No oropharyngeal exudate.  Eyes: Conjunctivae are normal. No scleral icterus.  Neck: Normal range of motion. Neck supple. No JVD present. No thyromegaly present.  Cardiovascular: Normal rate, regular rhythm, normal heart sounds and intact distal pulses.  Exam reveals no gallop and no friction rub.   No murmur heard. Pulmonary/Chest: Effort normal and breath sounds normal. No respiratory  distress. He has no wheezes. He has no rales. He exhibits no tenderness.  Abdominal: Soft. Bowel sounds are normal. He exhibits no distension and no mass. There is no tenderness. There is no rebound and no guarding.  Musculoskeletal:  Patient rises slowly from sitting to standing.  They walk without an antalgic gait.  There is no evidence of erythema, ecchymosis, or gross deformity.  There is tenderness to palpation over right lumbar paraspinal muscles.  Patient   Active ROM is full but painful with right lateral bending.  Sensation to light touch is intact over all extremities.  Strength is symmetric and equal in all extremities.      Lymphadenopathy:    He has no cervical adenopathy.  Neurological: He is alert and oriented to person, place, and time. He has normal strength. No cranial nerve deficit or sensory deficit. Coordination normal.  Skin: Skin is warm and dry. He is not diaphoretic.  Psychiatric: He has a normal mood and affect. His behavior is normal. Judgment and thought content normal.  Nursing note and vitals reviewed.         Assessment & Plan:    1. Right-sided low back pain without sciatica No evidence of cauda equina.  No neuro deficits.  - meloxicam (MOBIC) 7.5 MG tablet; Take 1 tablet (7.5 mg total) by mouth daily.  Dispense: 30 tablet; Refill: 2 - diazepam (VALIUM) 2 MG tablet; Take 1 tablet (2 mg total) by mouth every 8 (eight) hours as needed for muscle spasms.  Dispense: 30 tablet; Refill: 0 - Urinalysis, Routine w  reflex microscopic (not at [facility]) - Culture, Urine

## 2015-05-06 LAB — URINALYSIS, ROUTINE W REFLEX MICROSCOPIC
Bilirubin Urine: NEGATIVE
GLUCOSE, UA: NEGATIVE mg/dL
Hgb urine dipstick: NEGATIVE
Ketones, ur: NEGATIVE mg/dL
LEUKOCYTES UA: NEGATIVE
Nitrite: NEGATIVE
PH: 6 (ref 5.0–8.0)
PROTEIN: NEGATIVE mg/dL
Specific Gravity, Urine: <1.005 — ABNORMAL LOW (ref 1.005–1.030)
Urobilinogen, UA: 0.2 mg/dL (ref 0.0–1.0)

## 2015-05-07 LAB — URINE CULTURE
Colony Count: NO GROWTH
Organism ID, Bacteria: NO GROWTH

## 2015-05-11 ENCOUNTER — Encounter: Payer: Self-pay | Admitting: Internal Medicine

## 2015-05-11 ENCOUNTER — Ambulatory Visit (INDEPENDENT_AMBULATORY_CARE_PROVIDER_SITE_OTHER): Payer: Medicare Other | Admitting: Internal Medicine

## 2015-05-11 VITALS — BP 108/62 | HR 60 | Temp 98.4°F | Resp 18 | Ht 71.0 in | Wt 190.0 lb

## 2015-05-11 DIAGNOSIS — K439 Ventral hernia without obstruction or gangrene: Secondary | ICD-10-CM

## 2015-05-11 NOTE — Patient Instructions (Signed)
Hernia  A hernia occurs when an internal organ pushes out through a weak spot in the abdominal wall. Hernias most commonly occur in the groin and around the navel. Hernias often can be pushed back into place (reduced). Most hernias tend to get worse over time. Some abdominal hernias can get stuck in the opening (irreducible or incarcerated hernia) and cannot be reduced. An irreducible abdominal hernia which is tightly squeezed into the opening is at risk for impaired blood supply (strangulated hernia). A strangulated hernia is a medical emergency. Because of the risk for an irreducible or strangulated hernia, surgery may be recommended to repair a hernia.  CAUSES    Heavy lifting.   Prolonged coughing.   Straining to have a bowel movement.   A cut (incision) made during an abdominal surgery.  HOME CARE INSTRUCTIONS    Bed rest is not required. You may continue your normal activities.   Avoid lifting more than 10 pounds (4.5 kg) or straining.   Cough gently. If you are a smoker it is best to stop. Even the best hernia repair can break down with the continual strain of coughing. Even if you do not have your hernia repaired, a cough will continue to aggravate the problem.   Do not wear anything tight over your hernia. Do not try to keep it in with an outside bandage or truss. These can damage abdominal contents if they are trapped within the hernia sac.   Eat a normal diet.   Avoid constipation. Straining over long periods of time will increase hernia size and encourage breakdown of repairs. If you cannot do this with diet alone, stool softeners may be used.  SEEK IMMEDIATE MEDICAL CARE IF:    You have a fever.   You develop increasing abdominal pain.   You feel nauseous or vomit.   Your hernia is stuck outside the abdomen, looks discolored, feels hard, or is tender.   You have any changes in your bowel habits or in the hernia that are unusual for you.   You have increased pain or swelling around the  hernia.   You cannot push the hernia back in place by applying gentle pressure while lying down.  MAKE SURE YOU:    Understand these instructions.   Will watch your condition.   Will get help right away if you are not doing well or get worse.  Document Released: 11/07/2005 Document Revised: 01/30/2012 Document Reviewed: 06/26/2008  ExitCare Patient Information 2015 [employer]. This information is not intended to replace advice given to you by your health care provider. Make sure you discuss any questions you have with your health care provider.

## 2015-05-11 NOTE — Progress Notes (Signed)
   Subjective:    Patient ID: Earl [name], male    DOB: 1941-07-30, 74 y.o.   MRN: [id number]  HPI  Patient presents to the office for evaluation of right side bulging.  He reports on Saturday he developed some bulging on his right side which he noticed on Saturday.  He reports that it is pain free and is more apparent when he is standing and goes away when he is laying down.  He reports he has not been doing any heavy lifting.  He also reports that he has not had any surgeries on his abdomen before.  He has had some inguinal hernias repaired in the past but that was remotely.    Review of Systems  Constitutional: Negative for fever, chills and fatigue.  Gastrointestinal: Positive for constipation. Negative for nausea, vomiting, abdominal pain, diarrhea, blood in stool and anal bleeding.  Genitourinary: Negative for dysuria, urgency, frequency, hematuria and difficulty urinating.       Objective:   Physical Exam  Constitutional: He is oriented to person, place, and time. He appears well-developed and well-nourished. No distress.  HENT:  Head: Normocephalic and atraumatic.  Mouth/Throat: Oropharynx is clear and moist. No oropharyngeal exudate.  Eyes: Conjunctivae are normal. No scleral icterus.  Neck: Normal range of motion. Neck supple. No JVD present. No thyromegaly present.  Cardiovascular: Normal rate, regular rhythm, normal heart sounds and intact distal pulses.  Exam reveals no gallop and no friction rub.   No murmur heard. Pulmonary/Chest: Effort normal and breath sounds normal. No respiratory distress. He has no wheezes. He has no rales. He exhibits no tenderness.  Abdominal: Soft. Normal appearance and bowel sounds are normal. He exhibits no distension and no mass. There is no tenderness. There is no rebound, no guarding and no CVA tenderness. A hernia is present. Hernia confirmed positive in the ventral area.    Musculoskeletal: Normal range of motion.  Lymphadenopathy:    He has no cervical adenopathy.  Neurological: He is alert and oriented to person, place, and time.  Skin: Skin is warm and dry. He is not diaphoretic.  Psychiatric: He has a normal mood and affect. His behavior is normal. Judgment and thought content normal.  Nursing note and vitals reviewed.         Assessment & Plan:    1. Abdominal wall hernia -given exam high suspicion for ventral hernia.  Asymptomatic currently -patient advised to go to the ER if bulging gets painful, hard, blood in stools, or if there is color changes of the skin. - Ambulatory referral to General Surgery

## 2015-05-19 ENCOUNTER — Other Ambulatory Visit: Payer: Self-pay | Admitting: Surgery

## 2015-05-19 DIAGNOSIS — R1900 Intra-abdominal and pelvic swelling, mass and lump, unspecified site: Secondary | ICD-10-CM | POA: Diagnosis not present

## 2015-05-20 ENCOUNTER — Other Ambulatory Visit: Payer: Self-pay | Admitting: *Deleted

## 2015-05-20 DIAGNOSIS — R1900 Intra-abdominal and pelvic swelling, mass and lump, unspecified site: Secondary | ICD-10-CM

## 2015-05-22 ENCOUNTER — Ambulatory Visit: Payer: Self-pay | Admitting: Internal Medicine

## 2015-05-26 ENCOUNTER — Ambulatory Visit
Admission: RE | Admit: 2015-05-26 | Discharge: 2015-05-26 | Disposition: A | Discharge status: Home / Self Care | Payer: Medicare Other | Source: Ambulatory Visit | Attending: Surgery | Admitting: Surgery

## 2015-05-26 DIAGNOSIS — R634 Abnormal weight loss: Secondary | ICD-10-CM | POA: Diagnosis not present

## 2015-05-26 DIAGNOSIS — N3289 Other specified disorders of bladder: Secondary | ICD-10-CM | POA: Diagnosis not present

## 2015-05-26 DIAGNOSIS — R1903 Right lower quadrant abdominal swelling, mass and lump: Secondary | ICD-10-CM | POA: Diagnosis not present

## 2015-05-26 MED ORDER — IOPAMIDOL (ISOVUE-300) INJECTION 61%
100.0000 mL | Freq: Once | INTRAVENOUS | Status: AC | PRN
  Administered 2015-05-26: 100 mL via INTRAVENOUS

## 2015-05-28 ENCOUNTER — Telehealth: Payer: Self-pay | Admitting: *Deleted

## 2015-05-28 ENCOUNTER — Other Ambulatory Visit: Payer: Self-pay | Admitting: Internal Medicine

## 2015-05-28 NOTE — Telephone Encounter (Signed)
Patient called and asked if it is OK to restart his Metformin since he had CT scan on 05/26/2015.  He also asked what to do regarding his back pain and weakness, even though his CT was OK.  Per Dr Melford Aase, no further test needed at this time.  Patient very concerned and OV with Dr Melford Aase was scheduled to discuss back problem.

## 2015-06-02 ENCOUNTER — Ambulatory Visit (INDEPENDENT_AMBULATORY_CARE_PROVIDER_SITE_OTHER): Payer: Medicare Other | Admitting: Internal Medicine

## 2015-06-02 ENCOUNTER — Encounter: Payer: Self-pay | Admitting: Internal Medicine

## 2015-06-02 VITALS — BP 120/64 | HR 60 | Temp 97.3°F | Resp 16 | Ht 71.0 in | Wt 186.4 lb

## 2015-06-02 DIAGNOSIS — E782 Mixed hyperlipidemia: Secondary | ICD-10-CM | POA: Diagnosis not present

## 2015-06-02 DIAGNOSIS — E559 Vitamin D deficiency, unspecified: Secondary | ICD-10-CM

## 2015-06-02 DIAGNOSIS — Z6826 Body mass index (BMI) 26.0-26.9, adult: Secondary | ICD-10-CM

## 2015-06-02 DIAGNOSIS — I10 Essential (primary) hypertension: Secondary | ICD-10-CM

## 2015-06-02 DIAGNOSIS — Z79899 Other long term (current) drug therapy: Secondary | ICD-10-CM | POA: Diagnosis not present

## 2015-06-02 DIAGNOSIS — G587 Mononeuritis multiplex: Secondary | ICD-10-CM

## 2015-06-02 DIAGNOSIS — E1141 Type 2 diabetes mellitus with diabetic mononeuropathy: Secondary | ICD-10-CM | POA: Insufficient documentation

## 2015-06-02 DIAGNOSIS — E1129 Type 2 diabetes mellitus with other diabetic kidney complication: Secondary | ICD-10-CM | POA: Diagnosis not present

## 2015-06-02 DIAGNOSIS — E0841 Diabetes mellitus due to underlying condition with diabetic mononeuropathy: Secondary | ICD-10-CM

## 2015-06-02 LAB — CBC WITH DIFFERENTIAL/PLATELET
BASOS ABS: 0 10*3/uL (ref 0.0–0.1)
Basophils Relative: 1 % (ref 0–1)
EOS PCT: 2 % (ref 0–5)
Eosinophils Absolute: 0.1 10*3/uL (ref 0.0–0.7)
HEMATOCRIT: 38.9 % — AB (ref 39.0–52.0)
Hemoglobin: 13.3 g/dL (ref 13.0–17.0)
LYMPHS ABS: 2 10*3/uL (ref 0.7–4.0)
LYMPHS PCT: 44 % (ref 12–46)
MCH: 28.7 pg (ref 26.0–34.0)
MCHC: 34.2 g/dL (ref 30.0–36.0)
MCV: 84 fL (ref 78.0–100.0)
MPV: 9.9 fL (ref 8.6–12.4)
Monocytes Absolute: 0.6 10*3/uL (ref 0.1–1.0)
Monocytes Relative: 12 % (ref 3–12)
NEUTROS PCT: 41 % — AB (ref 43–77)
Neutro Abs: 1.9 10*3/uL (ref 1.7–7.7)
Platelets: 177 10*3/uL (ref 150–400)
RBC: 4.63 MIL/uL (ref 4.22–5.81)
RDW: 14.3 % (ref 11.5–15.5)
WBC: 4.6 10*3/uL (ref 4.0–10.5)

## 2015-06-02 NOTE — Patient Instructions (Signed)
Recommend Adult Low dose Aspirin or   coated  Aspirin 81 mg daily   To reduce risk of Colon Cancer 20 %,   Skin Cancer 26 % ,   Melanoma 46%   and   Pancreatic cancer 60%  ++++++++++++++++++  Vitamin D goal   is between 70-100.   Please make sure that you are taking your Vitamin D as directed.   It is very important as a natural anti-inflammatory   helping hair, skin, and nails, as well as reducing stroke and heart attack risk.   It helps your bones and helps with mood.  It also decreases numerous cancer risks so please take it as directed.   Low Vit D is associated with a 200-300% higher risk for CANCER   and 200-300% higher risk for HEART   ATTACK  &  STROKE.   ......................................  It is also associated with higher death rate at younger ages,   autoimmune diseases like Rheumatoid arthritis, Lupus, Multiple Sclerosis.     Also many other serious conditions, like depression, Alzheimer's  Dementia, infertility, muscle aches, fatigue, fibromyalgia - just to name a few.  +++++++++++++++++++  Recommend the book "The END of DIETING" by Dr Joel Fuhrman   & the book "The END of DIABETES " by Dr Joel Fuhrman  At [employer] - get book & Audio CD's     Being diabetic has a  300% increased risk for heart attack, stroke, cancer, and alzheimer- type vascular dementia. It is very important that you work harder with diet by avoiding all foods that are white. Avoid white rice (brown & wild rice is OK), white potatoes (sweetpotatoes in moderation is OK), White bread or wheat bread or anything made out of white flour like bagels, donuts, rolls, buns, biscuits, cakes, pastries, cookies, pizza crust, and pasta (made from white flour & egg whites) - vegetarian pasta or spinach or wheat pasta is OK. Multigrain breads like Arnold's or [employer], or multigrain sandwich thins or flatbreads.  Diet, exercise and weight loss can reverse and cure diabetes in the early  stages.  Diet, exercise and weight loss is very important in the control and prevention of complications of diabetes which affects every system in your body, ie. Brain - dementia/stroke, eyes - glaucoma/blindness, heart - heart attack/heart failure, kidneys - dialysis, stomach - gastric paralysis, intestines - malabsorption, nerves - severe painful neuritis, circulation - gangrene & loss of a leg(s), and finally cancer and Alzheimers.    I recommend avoid fried & greasy foods,  sweets/candy, white rice (brown or wild rice or Quinoa is OK), white potatoes (sweet potatoes are OK) - anything made from white flour - bagels, doughnuts, rolls, buns, biscuits,white and wheat breads, pizza crust and traditional pasta made of white flour & egg white(vegetarian pasta or spinach or wheat pasta is OK).  Multi-grain bread is OK - like multi-grain flat bread or sandwich thins. Avoid alcohol in excess. Exercise is also important.    Eat all the vegetables you want - avoid meat, especially red meat and dairy - especially cheese.  Cheese is the most concentrated form of trans-fats which is the worst thing to clog up our arteries. Veggie cheese is OK which can be found in the fresh produce section at [employer] or [employer] or [employer]  ++++++++++++++++++++++++++   

## 2015-06-02 NOTE — Progress Notes (Signed)
Patient ID: Earl [name], male   DOB: [birth year], 74 y.o.   MRN: [id number]   This very nice 74 y.o.male presents for 3 month follow up with Hypertension, Hyperlipidemia, T2_NIDDM w/ CKDand Vitamin D Deficiency. Patient was evaluated recently for c/o "bulging" of his right mid abdomen and had surgical consult and then abd CTs which fortunately did not reveal any internal visceral tumors nor any abdominal wall tumors or hernias. Patient denies any k/o mechanical strain or injury. He is advised today that this most likely represents sequellae of a diabetic mononeuritis and denervation as he reports a numb decreased sensation in the the right  T11-T12 dermatomes where his mid right abdomen see,ms flaccid and assymetricaly bulged -out as compared to the left side.    Patient is treated for HTN & BP has been controlled at home. Today's BP: 120/64 mmHg. Patient has had no complaints of any cardiac type chest pain, palpitations, dyspnea/orthopnea/PND, dizziness, claudication, or dependent edema.   Hyperlipidemia is controlled with diet & meds. Patient denies myalgias or other med SE's. Last Lipids were  Cholesterol 116; HDL 49; LDL 52; Triglycerides 75 on 02/09/2015.   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  Last A1c was 6.6% on 02/09/2015.   Further, the patient also has history of Vitamin D Deficiency of 30 in 2008 and supplements vitamin D without any suspected side-effects. Last vitamin D was 65 on 02/09/2015.   Medication Sig  . aspirin 81 MG tablet Take 81 mg by mouth daily.  [name] atenolol  100 MG tablet Take 1 tablet by mouth  daily  . VITAMIN D Take 5,000 Units by mouth daily.  . diazepam  2 MG tablet Take 1 tablet (2 mg total) by mouth every 8 (eight) hours as needed for muscle spasms.  . finasteride  5 MG tablet Take 1 tablet by mouth  every day for prostate   . gabapentin 300 MG capsule 1-2 at night for pain  . glipiZIDE  5 MG tablet Take 1 tablet by mouth 3   times a day before meals   for diabetes  . hctz  25 MG tablet Take 1 tablet by mouth  every day for blood  pressure and fluid  . levothyroxine  50 MCG tablet Take 1 tablet (50 mcg total) by mouth daily before breakfast.  . Magnesium 250 MG TABS Take 250 mg by mouth daily.  . meloxicam  7.5 MG tablet Take 1 tablet (7.5 mg total) by mouth daily.  . metFORMIN -XR 500 MG t Take 1 tablet by mouth 4  times daily for diabetes  . Multiple Vitamin  Take 1 capsule by mouth daily.    . KCl (KLOR-CON) 10 MEQ  Take 10 mEq by mouth daily.    . quinapril  40 MG tablet Take 1 tablet by mouth  every day for blood  pressure  . simvastatin  40 MG tablet Take 1 tablet at bedtime  or as directed for cholesterol  . vitamin C  500 MG tablet Take 500 mg by mouth daily.     Allergies  Allergen Reactions  . Penicillins    PMHx:   Past Medical History  Diagnosis Date  . Diabetes mellitus   . Hypertension   . Hyperlipidemia   . Atrial fibrillation   . Type II or unspecified type diabetes mellitus without mention of complication, not  stated as uncontrolled   . BPH (benign prostatic hyperplasia)   . Other testicular hypofunction    Immunization History  Administered Date(s) Administered  . DT 02/09/2015  . Influenza Split 08/07/2013  . Influenza, High Dose Seasonal PF 08/19/2014  . Pneumococcal Polysaccharide-23 08/13/2008  . Td 11/22/2003   Past Surgical History  Procedure Laterality Date  . Inguinal hernia repair      1968 and 2004   FHx:    Reviewed / unchanged  SHx:    Reviewed / unchanged  Systems Review:  Constitutional: Denies fever, chills, wt changes, headaches, insomnia, fatigue, night sweats, change in appetite. Eyes: Denies redness, blurred vision, diplopia, discharge, itchy, watery eyes.  ENT: Denies discharge, congestion, post nasal drip, epistaxis, sore throat, earache, hearing  loss, dental pain, tinnitus, vertigo, sinus pain, snoring.  CV: Denies chest pain, palpitations, irregular heartbeat, syncope, dyspnea, diaphoresis, orthopnea, PND, claudication or edema. Respiratory: denies cough, dyspnea, DOE, pleurisy, hoarseness, laryngitis, wheezing.  Gastrointestinal: Denies dysphagia, odynophagia, heartburn, reflux, water brash, abdominal pain or cramps, nausea, vomiting, bloating, diarrhea, constipation, hematemesis, melena, hematochezia  or hemorrhoids. Genitourinary: Denies dysuria, frequency, urgency, nocturia, hesitancy, discharge, hematuria or flank pain. Musculoskeletal: Denies arthralgias, myalgias, stiffness, jt. swelling, pain, limping or strain/sprain.  Skin: Denies pruritus, rash, hives, warts, acne, eczema or change in skin lesion(s). Neuro: No weakness, tremor, incoordination, spasms, paresthesia or pain. Psychiatric: Denies confusion, memory loss or sensory loss. Endo: Denies change in weight, skin or hair change.  Heme/Lymph: No excessive bleeding, bruising or enlarged lymph nodes.  Physical Exam  BP 120/64   Pulse 60  Temp 97.3 F   Resp 16  Ht 5\' 11"    Wt 186 lb 6.4 oz     BMI 26.01   Appears well nourished and in no distress. Eyes: PERRLA, EOMs, conjunctiva no swelling or erythema. Sinuses: No frontal/maxillary tenderness ENT/Mouth: EAC's clear, TM's nl w/o erythema, bulging. Nares clear w/o erythema, swelling, exudates. Oropharynx clear without erythema or exudates. Oral hygiene is good. Tongue normal, non obstructing. Hearing intact.  Neck: Supple. Thyroid nl. Car 2+/2+ without bruits, nodes or JVD. Chest: Respirations nl with BS clear & equal w/o rales, rhonchi, wheezing or stridor.  Cor: Heart sounds normal w/ regular rate and rhythm without sig. murmurs, gallops, clicks, or rubs. Peripheral pulses normal and equal  without edema.  Abdomen: Soft & bowel sounds normal. Non-tender w/o guarding, rebound, hernias, masses, or organomegaly. Right  abdomen is soft and assymetricaly distended as compared to the left mid abdomen.  Lymphatics: Unremarkable.  Musculoskeletal: Full ROM all peripheral extremities, joint stability, 5/5 strength, and normal gait.  Skin: Warm, dry without exposed rashes, lesions or ecchymosis apparent.  Neuro: Cranial nerves intact, reflexes equal bilaterally. Sensory-motor testing grossly intact. Tendon reflexes grossly intact.  Pysch: Alert & oriented x 3.  Insight and judgement nl & appropriate. No ideations.  Assessment and Plan:  1. Essential hypertension  - TSH  2. Hyperlipidemia  - Lipid panel  3. 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 Vitamin D deficiency  - Vit D  25 hydroxy   5. Diabetic mononeuritis multiplex, right abdominal wall   6. Medication management  - BASIC METABOLIC PANEL WITH GFR - CBC with Differential/Platelet - Hepatic function panel - Magnesium  7. BMI 26.0-26.9,adult   Recommended regular exercise, BP monitoring, weight control, and discussed med and SE's. Recommended labs to assess and monitor clinical status. Further disposition pending results of  labs. Over 30 minutes of exam, counseling, chart review was performed

## 2015-06-03 LAB — LIPID PANEL
CHOL/HDL RATIO: 2.1 ratio
Cholesterol: 98 mg/dL (ref 0–200)
HDL: 47 mg/dL (ref >=40)
LDL Cholesterol: 35 mg/dL (ref 0–99)
Triglycerides: 79 mg/dL (ref <150)
VLDL: 16 mg/dL (ref 0–40)

## 2015-06-03 LAB — BASIC METABOLIC PANEL WITH GFR
BUN: 14 mg/dL (ref 6–23)
CALCIUM: 9.3 mg/dL (ref 8.4–10.5)
CO2: 25 mEq/L (ref 19–32)
CREATININE: 1.06 mg/dL (ref 0.50–1.35)
Chloride: 103 mEq/L (ref 96–112)
GFR, Est African American: 80 mL/min
GFR, Est Non African American: 69 mL/min
Glucose, Bld: 80 mg/dL (ref 70–99)
Potassium: 4.3 mEq/L (ref 3.5–5.3)
Sodium: 140 mEq/L (ref 135–145)

## 2015-06-03 LAB — HEMOGLOBIN A1C
Hgb A1c MFr Bld: 6.7 % — ABNORMAL HIGH (ref <5.7)
Mean Plasma Glucose: 146 mg/dL — ABNORMAL HIGH (ref <117)

## 2015-06-03 LAB — VITAMIN D 25 HYDROXY (VIT D DEFICIENCY, FRACTURES): VIT D 25 HYDROXY: 92 ng/mL (ref 30–100)

## 2015-06-03 LAB — HEPATIC FUNCTION PANEL
ALT: 14 U/L (ref 0–53)
AST: 18 U/L (ref 0–37)
Albumin: 3.8 g/dL (ref 3.5–5.2)
Alkaline Phosphatase: 56 U/L (ref 39–117)
BILIRUBIN INDIRECT: 0.5 mg/dL (ref 0.2–1.2)
Bilirubin, Direct: 0.2 mg/dL (ref 0.0–0.3)
Total Bilirubin: 0.7 mg/dL (ref 0.2–1.2)
Total Protein: 5.7 g/dL — ABNORMAL LOW (ref 6.0–8.3)

## 2015-06-03 LAB — INSULIN, RANDOM: INSULIN: 7 u[IU]/mL (ref 2.0–19.6)

## 2015-06-03 LAB — MAGNESIUM: MAGNESIUM: 1.8 mg/dL (ref 1.5–2.5)

## 2015-06-03 LAB — TSH: TSH: 0.759 u[IU]/mL (ref 0.350–4.500)

## 2015-06-05 ENCOUNTER — Other Ambulatory Visit: Payer: Self-pay | Admitting: Internal Medicine

## 2015-07-08 ENCOUNTER — Other Ambulatory Visit: Payer: Self-pay | Admitting: Internal Medicine

## 2015-07-15 ENCOUNTER — Other Ambulatory Visit: Payer: Self-pay | Admitting: Internal Medicine

## 2015-07-16 ENCOUNTER — Other Ambulatory Visit: Payer: Self-pay | Admitting: Physician Assistant

## 2015-07-16 MED ORDER — HYDROCHLOROTHIAZIDE 25 MG PO TABS: ORAL_TABLET | ORAL | Status: DC

## 2015-07-16 MED ORDER — LEVOTHYROXINE SODIUM 50 MCG PO TABS: ORAL_TABLET | ORAL | Status: DC

## 2015-08-28 ENCOUNTER — Ambulatory Visit (INDEPENDENT_AMBULATORY_CARE_PROVIDER_SITE_OTHER): Payer: Medicare Other | Admitting: Internal Medicine

## 2015-08-28 ENCOUNTER — Encounter: Payer: Self-pay | Admitting: Internal Medicine

## 2015-08-28 VITALS — BP 122/64 | HR 56 | Temp 97.9°F | Resp 16 | Ht 71.0 in | Wt 190.2 lb

## 2015-08-28 DIAGNOSIS — Z6826 Body mass index (BMI) 26.0-26.9, adult: Secondary | ICD-10-CM | POA: Diagnosis not present

## 2015-08-28 DIAGNOSIS — Z1331 Encounter for screening for depression: Secondary | ICD-10-CM

## 2015-08-28 DIAGNOSIS — E559 Vitamin D deficiency, unspecified: Secondary | ICD-10-CM

## 2015-08-28 DIAGNOSIS — Z79899 Other long term (current) drug therapy: Secondary | ICD-10-CM | POA: Diagnosis not present

## 2015-08-28 DIAGNOSIS — Z23 Encounter for immunization: Secondary | ICD-10-CM

## 2015-08-28 DIAGNOSIS — Z0000 Encounter for general adult medical examination without abnormal findings: Secondary | ICD-10-CM | POA: Insufficient documentation

## 2015-08-28 DIAGNOSIS — E782 Mixed hyperlipidemia: Secondary | ICD-10-CM | POA: Diagnosis not present

## 2015-08-28 DIAGNOSIS — I10 Essential (primary) hypertension: Secondary | ICD-10-CM | POA: Diagnosis not present

## 2015-08-28 DIAGNOSIS — E1129 Type 2 diabetes mellitus with other diabetic kidney complication: Secondary | ICD-10-CM | POA: Diagnosis not present

## 2015-08-28 DIAGNOSIS — E1121 Type 2 diabetes mellitus with diabetic nephropathy: Secondary | ICD-10-CM

## 2015-08-28 DIAGNOSIS — Z9181 History of falling: Secondary | ICD-10-CM

## 2015-08-28 LAB — BASIC METABOLIC PANEL WITH GFR
BUN: 17 mg/dL (ref 7–25)
CHLORIDE: 106 mmol/L (ref 98–110)
CO2: 27 mmol/L (ref 20–31)
CREATININE: 1.1 mg/dL (ref 0.70–1.18)
Calcium: 8.6 mg/dL (ref 8.6–10.3)
GFR, Est African American: 76 mL/min (ref >=60)
GFR, Est Non African American: 66 mL/min (ref >=60)
GLUCOSE: 118 mg/dL — AB (ref 65–99)
POTASSIUM: 4.3 mmol/L (ref 3.5–5.3)
Sodium: 140 mmol/L (ref 135–146)

## 2015-08-28 LAB — CBC WITH DIFFERENTIAL/PLATELET
BASOS ABS: 0 10*3/uL (ref 0.0–0.1)
Basophils Relative: 1 % (ref 0–1)
EOS ABS: 0.2 10*3/uL (ref 0.0–0.7)
Eosinophils Relative: 5 % (ref 0–5)
HEMATOCRIT: 38.5 % — AB (ref 39.0–52.0)
Hemoglobin: 13.2 g/dL (ref 13.0–17.0)
LYMPHS ABS: 1.9 10*3/uL (ref 0.7–4.0)
LYMPHS PCT: 41 % (ref 12–46)
MCH: 28.7 pg (ref 26.0–34.0)
MCHC: 34.3 g/dL (ref 30.0–36.0)
MCV: 83.7 fL (ref 78.0–100.0)
MONOS PCT: 10 % (ref 3–12)
MPV: 9.8 fL (ref 8.6–12.4)
Monocytes Absolute: 0.5 10*3/uL (ref 0.1–1.0)
NEUTROS PCT: 43 % (ref 43–77)
Neutro Abs: 2 10*3/uL (ref 1.7–7.7)
PLATELETS: 184 10*3/uL (ref 150–400)
RBC: 4.6 MIL/uL (ref 4.22–5.81)
RDW: 13.8 % (ref 11.5–15.5)
WBC: 4.7 10*3/uL (ref 4.0–10.5)

## 2015-08-28 LAB — LIPID PANEL
Cholesterol: 102 mg/dL — ABNORMAL LOW (ref 125–200)
HDL: 46 mg/dL (ref >=40)
LDL CALC: 44 mg/dL (ref <130)
Total CHOL/HDL Ratio: 2.2 Ratio (ref <=5.0)
Triglycerides: 61 mg/dL (ref <150)
VLDL: 12 mg/dL (ref <30)

## 2015-08-28 LAB — HEPATIC FUNCTION PANEL
ALBUMIN: 3.9 g/dL (ref 3.6–5.1)
ALK PHOS: 62 U/L (ref 40–115)
ALT: 14 U/L (ref 9–46)
AST: 18 U/L (ref 10–35)
BILIRUBIN TOTAL: 0.7 mg/dL (ref 0.2–1.2)
Bilirubin, Direct: 0.2 mg/dL (ref <=0.2)
Indirect Bilirubin: 0.5 mg/dL (ref 0.2–1.2)
TOTAL PROTEIN: 5.8 g/dL — AB (ref 6.1–8.1)

## 2015-08-28 LAB — MAGNESIUM: MAGNESIUM: 1.9 mg/dL (ref 1.5–2.5)

## 2015-08-28 LAB — HEMOGLOBIN A1C
HEMOGLOBIN A1C: 6.6 % — AB (ref <5.7)
MEAN PLASMA GLUCOSE: 143 mg/dL — AB (ref <117)

## 2015-08-28 NOTE — Progress Notes (Addendum)
Patient ID: Earl [name], male   DOB: [birth year], 74 y.o.   MRN: [id number]   This very nice 74 y.o. MBM presents for 6 month follow up with Hypertension, Hyperlipidemia, T2_DM w/ CKD 2  and Vitamin D Deficiency.    Patient is treated for HTN since 1980 & BP has been controlled at home. Today's BP: 122/64 mmHg. Patient has had no complaints of any cardiac type chest pain, palpitations, dyspnea/orthopnea/PND, dizziness, claudication, or dependent edema. Patient exercises regularly with a stationary bike and also walks.    Hyperlipidemia is controlled with diet & meds. Patient denies myalgias or other med SE's. Last Lipids were at goal - Cholesterol 98; HDL 47; LDL 35; Triglycerides 79 on 06/02/2015.   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 Patient has monitored his Diabetic urines 1-2 x/day for the last 32 years and is resistant toward monitoring CBG's.  Last A1c was 6.7% on 06/02/2015. Annual diabetic eye exam is due this month with Dr Melissa Noon.    Further, the patient also has history of Vitamin D Deficiency of 30 in 2008 and supplements vitamin D without any suspected side-effects. Last vitamin D was 92 on 06/02/2015.  Medication Sig  . aspirin 81 MG tablet Take 81 mg by mouth daily.  [name] atenolol (TENORMIN) 100 MG tablet Take 1 tablet by mouth  daily  . Cholecalciferol (VITAMIN D PO) Take 5,000 Units by mouth daily.  . diazepam (VALIUM) 2 MG tablet Take 1 tablet (2 mg total) by mouth every 8 (eight) hours as needed for muscle spasms.  . finasteride (PROSCAR) 5 MG tablet Take 1 tablet by mouth  every day for prostate  . gabapentin (NEURONTIN) 300 MG capsule Take 1 to 2 capsules by  mouth at night for pain  . glipiZIDE (GLUCOTROL) 5 MG tablet Take 1 tablet by mouth 3  times a day before meals  for diabetes  . hydrochlorothiazide (HYDRODIURIL) 25 MG tablet  Take 1 tablet by mouth   every day for blood   pressure and fluid  . levothyroxine (SYNTHROID, LEVOTHROID) 50 MCG tablet Take 1 tablet by mouth  daily before breakfast.  . Magnesium 250 MG TABS Take 250 mg by mouth daily.  . meloxicam (MOBIC) 7.5 MG tablet Take 1 tablet (7.5 mg total) by mouth daily.  . metFORMIN (GLUCOPHAGE-XR) 500 MG 24 hr tablet Take 1 tablet by mouth 4  times daily for diabetes  . Multiple Vitamin (MULTIVITAMIN) capsule Take 1 capsule by mouth daily.    . potassium chloride (KLOR-CON) 10 MEQ CR tablet Take 10 mEq by mouth daily.    . quinapril (ACCUPRIL) 40 MG tablet Take 1 tablet by mouth  every day for blood  pressure  . simvastatin (ZOCOR) 40 MG tablet Take 1 tablet by mouth at  bedtime or as directed for  cholesterol  . vitamin C (ASCORBIC ACID) 500 MG tablet Take 500 mg by mouth daily.     Allergies  Allergen Reactions  . Penicillins    PMHx:   Past Medical History  Diagnosis Date  . Diabetes mellitus   . Hypertension   . Hyperlipidemia   . Atrial fibrillation   . Type II or unspecified type diabetes mellitus without mention of complication, not stated as uncontrolled   . BPH (benign prostatic hyperplasia)   . Other testicular hypofunction    Immunization History  Administered Date(s) Administered  . DT 02/09/2015  . Influenza Split 08/07/2013  . Influenza, High Dose Seasonal PF 08/19/2014  . Pneumococcal Polysaccharide-23 08/13/2008  . Td 11/22/2003   Past Surgical History  Procedure Laterality Date  . Inguinal hernia repair      1968 and 2004   FHx:    Reviewed / unchanged  SHx:    Reviewed / unchanged  Systems Review:  Constitutional: Denies fever, chills, wt changes, headaches, insomnia, fatigue, night sweats, change in appetite. Eyes: Denies redness, blurred vision, diplopia, discharge, itchy, watery eyes.  ENT: Denies discharge, congestion, post nasal drip, epistaxis, sore throat, earache, hearing loss, dental pain, tinnitus, vertigo,  sinus pain, snoring.  CV: Denies chest pain, palpitations, irregular heartbeat, syncope, dyspnea, diaphoresis, orthopnea, PND, claudication or edema. Respiratory: denies cough, dyspnea, DOE, pleurisy, hoarseness, laryngitis, wheezing.  Gastrointestinal: Denies dysphagia, odynophagia, heartburn, reflux, water brash, abdominal pain or cramps, nausea, vomiting, bloating, diarrhea, constipation, hematemesis, melena, hematochezia  or hemorrhoids. Genitourinary: Denies dysuria, frequency, urgency, nocturia, hesitancy, discharge, hematuria or flank pain. Musculoskeletal: Denies arthralgias, myalgias, stiffness, jt. swelling, pain, limping or strain/sprain. Denies falls. Skin: Denies pruritus, rash, hives, warts, acne, eczema or change in skin lesion(s). Neuro: No weakness, tremor, incoordination, spasms, paresthesia or pain. Psychiatric: Denies confusion, memory loss or sensory loss. Denies Depression.  Endo: Denies change in weight, skin or hair change.  Heme/Lymph: No excessive bleeding, bruising or enlarged lymph nodes.  Physical Exam  BP 122/64 mmHg  Pulse 56  Temp(Src) 97.9 F (36.6 C)  Resp 16  Ht 5\' 11"  (1.803 m)  Wt 190 lb 3.2 oz (86.274 kg)  BMI 26.54 kg/m2  Appears well nourished and in no distress. Eyes: PERRLA, EOMs, conjunctiva no swelling or erythema. Sinuses: No frontal/maxillary tenderness ENT/Mouth: EAC's clear, TM's nl w/o erythema, bulging. Nares clear w/o erythema, swelling, exudates. Oropharynx clear without erythema or exudates. Oral hygiene is good. Tongue normal, non obstructing. Hearing intact.  Neck: Supple. Thyroid nl. Car 2+/2+ without bruits, nodes or JVD. Chest: Respirations nl with BS clear & equal w/o rales, rhonchi, wheezing or stridor.  Cor: Heart sounds normal w/ regular rate and rhythm without sig. murmurs, gallops, clicks, or rubs. Peripheral pulses normal and equal  without edema.  Abdomen: Soft & bowel sounds normal. Non-tender w/o guarding, rebound,  hernias, masses, or organomegaly.  Lymphatics: Unremarkable.  Musculoskeletal: Full ROM all peripheral extremities, joint stability, 5/5 strength, and normal gait.  Skin: Warm, dry without exposed rashes, lesions or ecchymosis apparent.  Neuro: Cranial nerves intact, reflexes equal bilaterally. Sensory-motor testing grossly intact. Tendon reflexes grossly intact. Sensory intact by Monofilament testing bilaterally to the toes. Pysch: Alert & oriented x 3.  Insight and judgement nl & appropriate. No ideations.  Assessment and Plan:  1. Essential hypertension  - TSH  2. Hyperlipidemia  - Lipid panel  3. Type 2 diabetes mellitus with diabetic nephropathy, without long-term current use of insulin (HCC)  - Hemoglobin A1c - Insulin, random  4. Vitamin D deficiency  - Vit D  25 hydroxy  5. BMI 26.53,adult   6. Medication management  - CBC with Differential/Platelet - BASIC METABOLIC PANEL WITH GFR - Hepatic function panel - Magnesium   Recommended regular exercise, BP monitoring, weight control, and discussed med and SE's. Recommended labs to assess and monitor clinical status. Further disposition pending results of labs. Over 30 minutes of exam, counseling, chart review was performed

## 2015-08-28 NOTE — Patient Instructions (Signed)
Recommend Adult Low dose Aspirin or   coated  Aspirin 81 mg daily   To reduce risk of Colon Cancer 20 %,   Skin Cancer 26 % ,   Melanoma 46%   and   Pancreatic cancer 60%  ++++++++++++++++++  Vitamin D goal   is between 70-100.   Please make sure that you are taking your Vitamin D as directed.   It is very important as a natural anti-inflammatory   helping hair, skin, and nails, as well as reducing stroke and heart attack risk.   It helps your bones and helps with mood.  It also decreases numerous cancer risks so please take it as directed.   Low Vit D is associated with a 200-300% higher risk for CANCER   and 200-300% higher risk for HEART   ATTACK  &  STROKE.   ......................................  It is also associated with higher death rate at younger ages,   autoimmune diseases like Rheumatoid arthritis, Lupus, Multiple Sclerosis.     Also many other serious conditions, like depression, Alzheimer's  Dementia, infertility, muscle aches, fatigue, fibromyalgia - just to name a few.  +++++++++++++++++++  Recommend the book "The END of DIETING" by Dr Joel Fuhrman   & the book "The END of DIABETES " by Dr Joel Fuhrman  At [employer] - get book & Audio CD's     Being diabetic has a  300% increased risk for heart attack, stroke, cancer, and alzheimer- type vascular dementia. It is very important that you work harder with diet by avoiding all foods that are white. Avoid white rice (brown & wild rice is OK), white potatoes (sweetpotatoes in moderation is OK), White bread or wheat bread or anything made out of white flour like bagels, donuts, rolls, buns, biscuits, cakes, pastries, cookies, pizza crust, and pasta (made from white flour & egg whites) - vegetarian pasta or spinach or wheat pasta is OK. Multigrain breads like Arnold's or [employer], or multigrain sandwich thins or flatbreads.  Diet, exercise and weight loss can reverse and cure diabetes in the early  stages.  Diet, exercise and weight loss is very important in the control and prevention of complications of diabetes which affects every system in your body, ie. Brain - dementia/stroke, eyes - glaucoma/blindness, heart - heart attack/heart failure, kidneys - dialysis, stomach - gastric paralysis, intestines - malabsorption, nerves - severe painful neuritis, circulation - gangrene & loss of a leg(s), and finally cancer and Alzheimers.    I recommend avoid fried & greasy foods,  sweets/candy, white rice (brown or wild rice or Quinoa is OK), white potatoes (sweet potatoes are OK) - anything made from white flour - bagels, doughnuts, rolls, buns, biscuits,white and wheat breads, pizza crust and traditional pasta made of white flour & egg white(vegetarian pasta or spinach or wheat pasta is OK).  Multi-grain bread is OK - like multi-grain flat bread or sandwich thins. Avoid alcohol in excess. Exercise is also important.    Eat all the vegetables you want - avoid meat, especially red meat and dairy - especially cheese.  Cheese is the most concentrated form of trans-fats which is the worst thing to clog up our arteries. Veggie cheese is OK which can be found in the fresh produce section at [employer] or [employer] or [employer]  ++++++++++++++++++++++++++   

## 2015-08-29 LAB — VITAMIN D 25 HYDROXY (VIT D DEFICIENCY, FRACTURES): VIT D 25 HYDROXY: 99 ng/mL (ref 30–100)

## 2015-08-29 LAB — INSULIN, RANDOM: INSULIN: 17.2 u[IU]/mL (ref 2.0–19.6)

## 2015-08-29 LAB — TSH: TSH: 0.709 u[IU]/mL (ref 0.350–4.500)

## 2015-09-04 DIAGNOSIS — E119 Type 2 diabetes mellitus without complications: Secondary | ICD-10-CM | POA: Diagnosis not present

## 2015-10-27 ENCOUNTER — Other Ambulatory Visit (HOSPITAL_BASED_OUTPATIENT_CLINIC_OR_DEPARTMENT_OTHER): Payer: Medicare Other

## 2015-10-27 DIAGNOSIS — D472 Monoclonal gammopathy: Secondary | ICD-10-CM | POA: Diagnosis not present

## 2015-10-27 LAB — CBC WITH DIFFERENTIAL/PLATELET
BASO%: 0.9 % (ref 0.0–2.0)
Basophils Absolute: 0 10*3/uL (ref 0.0–0.1)
EOS ABS: 0.2 10*3/uL (ref 0.0–0.5)
EOS%: 3.2 % (ref 0.0–7.0)
HEMATOCRIT: 40 % (ref 38.4–49.9)
HEMOGLOBIN: 13.3 g/dL (ref 13.0–17.1)
LYMPH#: 2 10*3/uL (ref 0.9–3.3)
LYMPH%: 40.2 % (ref 14.0–49.0)
MCH: 29.1 pg (ref 27.2–33.4)
MCHC: 33.3 g/dL (ref 32.0–36.0)
MCV: 87.3 fL (ref 79.3–98.0)
MONO#: 0.6 10*3/uL (ref 0.1–0.9)
MONO%: 11 % (ref 0.0–14.0)
NEUT#: 2.2 10*3/uL (ref 1.5–6.5)
NEUT%: 44.7 % (ref 39.0–75.0)
PLATELETS: 172 10*3/uL (ref 140–400)
RBC: 4.58 10*6/uL (ref 4.20–5.82)
RDW: 13.4 % (ref 11.0–14.6)
WBC: 5 10*3/uL (ref 4.0–10.3)

## 2015-10-27 LAB — COMPREHENSIVE METABOLIC PANEL
ALBUMIN: 3.6 g/dL (ref 3.5–5.0)
ALK PHOS: 70 U/L (ref 40–150)
ALT: 12 U/L (ref 0–55)
AST: 18 U/L (ref 5–34)
Anion Gap: 10 mEq/L (ref 3–11)
BUN: 15.2 mg/dL (ref 7.0–26.0)
CALCIUM: 9.3 mg/dL (ref 8.4–10.4)
CO2: 24 mEq/L (ref 22–29)
Chloride: 108 mEq/L (ref 98–109)
Creatinine: 1.2 mg/dL (ref 0.7–1.3)
EGFR: 69 mL/min/{1.73_m2} — AB (ref >90)
Glucose: 125 mg/dl (ref 70–140)
POTASSIUM: 4.4 meq/L (ref 3.5–5.1)
Sodium: 142 mEq/L (ref 136–145)
Total Bilirubin: 0.5 mg/dL (ref 0.20–1.20)
Total Protein: 6.3 g/dL — ABNORMAL LOW (ref 6.4–8.3)

## 2015-10-30 LAB — SPEP & IFE WITH QIG
ABNORMAL PROTEIN BAND2: 0.1 g/dL
ALPHA-2-GLOBULIN: 0.7 g/dL (ref 0.5–0.9)
Abnormal Protein Band1: 0.2 g/dL
Abnormal Protein Band3: NOT DETECTED g/dL
Albumin ELP: 3.5 g/dL — ABNORMAL LOW (ref 3.8–4.8)
Alpha-1-Globulin: 0.3 g/dL (ref 0.2–0.3)
BETA 2: 0.4 g/dL (ref 0.2–0.5)
Beta Globulin: 0.4 g/dL (ref 0.4–0.6)
Gamma Globulin: 0.6 g/dL — ABNORMAL LOW (ref 0.8–1.7)
IGA: 283 mg/dL (ref 68–379)
IGM, SERUM: 11 mg/dL — AB (ref 41–251)
IgG (Immunoglobin G), Serum: 570 mg/dL — ABNORMAL LOW (ref 650–1600)
TOTAL PROTEIN, SERUM ELECTROPHOR: 5.8 g/dL — AB (ref 6.1–8.1)

## 2015-10-30 LAB — KAPPA/LAMBDA LIGHT CHAINS
KAPPA LAMBDA RATIO: 1.1 (ref 0.26–1.65)
Kappa free light chain: 1.82 mg/dL (ref 0.33–1.94)
Lambda Free Lght Chn: 1.65 mg/dL (ref 0.57–2.63)

## 2015-11-03 ENCOUNTER — Ambulatory Visit (HOSPITAL_BASED_OUTPATIENT_CLINIC_OR_DEPARTMENT_OTHER): Payer: Medicare Other | Admitting: Oncology

## 2015-11-03 ENCOUNTER — Telehealth: Payer: Self-pay | Admitting: Oncology

## 2015-11-03 VITALS — BP 142/67 | HR 59 | Temp 97.7°F | Resp 18 | Ht 71.0 in | Wt 191.4 lb

## 2015-11-03 DIAGNOSIS — D472 Monoclonal gammopathy: Secondary | ICD-10-CM

## 2015-11-03 NOTE — Telephone Encounter (Signed)
Gave and printd appt sched and avs fo rpt for DEC 2017 °

## 2015-11-03 NOTE — Progress Notes (Signed)
Hematology and Oncology Follow Up Visit  Earl [name] [id number] 1941-06-14 74 y.o. 11/03/2015 8:58 AM MCKEOWN,WILLIAM DAVID, MDMcKeown, [name], MD   Principle Diagnosis: 74 year old man with IgA lambda MGUS diagnosed in 2004 without any evidence of multiple myeloma. He has normal quantitative immunoglobulins, small M spike of 0.24 g/dL and normal light chains.  Prior Therapy: Status post bone marrow biopsy on 02/14/2003 which showed 2% plasma cell infiltration.  Current therapy: Observation and surveillance on an annual basis.  Interim History:  Mr. Vanblarcom presents today for a follow-up visit. Since the last visit, he reports no recent complaints. He continues to do excellent quality of life and travels frequently. He has not reported any arthralgias or myalgias. He has not reported any opportunistic infections. He does not report any peripheral neuropathy or constitutional symptoms. Has not reported any recent hospitalization or illnesses. He is diabetic and takes oral medication for his diabetes and his blood sugar has been under excellent control for many years.   He does not report any headaches or blurry vision or syncope. He does not report any fevers, chills or sweats. He does not report any chest pain, shortness of breath or difficulty breathing. He does not report any nausea, vomiting or abdominal pain. He does not report any frequency urgency or hesitancy. He does not report any skeletal complaints. Rest of his review of systems unremarkable.  Medications: I have reviewed the patient's current medications.  Current Outpatient Prescriptions  Medication Sig Dispense Refill  . aspirin 81 MG tablet Take 81 mg by mouth daily.    [name] atenolol (TENORMIN) 100 MG tablet Take 1 tablet by mouth  daily 90 tablet 3  . Cholecalciferol (VITAMIN D PO) Take 5,000 Units by mouth daily.    . diazepam (VALIUM) 2 MG tablet Take 1 tablet (2 mg total) by mouth every 8 (eight) hours as needed for  muscle spasms. 30 tablet 0  . finasteride (PROSCAR) 5 MG tablet Take 1 tablet by mouth  every day for prostate 90 tablet 0  . gabapentin (NEURONTIN) 300 MG capsule Take 1 to 2 capsules by  mouth at night for pain 180 capsule 0  . glipiZIDE (GLUCOTROL) 5 MG tablet Take 1 tablet by mouth 3  times a day before meals  for diabetes 270 tablet 2  . hydrochlorothiazide (HYDRODIURIL) 25 MG tablet Take 1 tablet by mouth   every day for blood   pressure and fluid 90 tablet 0  . levothyroxine (SYNTHROID, LEVOTHROID) 50 MCG tablet Take 1 tablet by mouth  daily before breakfast. 90 tablet 0  . Magnesium 250 MG TABS Take 250 mg by mouth daily.    . metFORMIN (GLUCOPHAGE-XR) 500 MG 24 hr tablet Take 1 tablet by mouth 4  times daily for diabetes 360 tablet 0  . Multiple Vitamin (MULTIVITAMIN) capsule Take 1 capsule by mouth daily.      . potassium chloride (KLOR-CON) 10 MEQ CR tablet Take 10 mEq by mouth daily.      . quinapril (ACCUPRIL) 40 MG tablet Take 1 tablet by mouth  every day for blood  pressure 90 tablet 3  . simvastatin (ZOCOR) 40 MG tablet Take 1 tablet by mouth at  bedtime or as directed for  cholesterol 90 tablet 0  . vitamin C (ASCORBIC ACID) 500 MG tablet Take 500 mg by mouth daily.       No current facility-administered medications for this visit.     Allergies:  Allergies  Allergen Reactions  . Penicillins Other (  See Comments)    Patient stated,"I was told not to take Penicillin anymore. I don't remember what kind of reaction I had."    Past Medical History, Surgical history, Social history, and Family History were reviewed and updated.   Physical Exam: Blood pressure 142/67, pulse 59, temperature 97.7 F (36.5 C), temperature source Oral, resp. rate 18, height 5' 11" (1.803 m), weight 191 lb 6.4 oz (86.818 kg), SpO2 100 %. ECOG: 0 General appearance: Alert, awake gentleman appearing younger than stated age without distress today. Head: Normocephalic, without obvious abnormality no  oral ulcers or lesions. Neck: no adenopathy Lymph nodes: Cervical, supraclavicular, and axillary nodes normal. Heart:regular rate and rhythm, S1, S2 normal, no murmur, click, rub or gallop Lung:chest clear, no wheezing, rales, normal symmetric air entry Abdomin: soft, non-tender, without masses or organomegaly shifting dullness or ascites. EXT:no erythema, induration, or nodules   Lab Results: Lab Results  Component Value Date   WBC 5.0 10/27/2015   HGB 13.3 10/27/2015   HCT 40.0 10/27/2015   MCV 87.3 10/27/2015   PLT 172 10/27/2015     Chemistry      Component Value Date/Time   NA 142 10/27/2015 0806   NA 140 08/28/2015 1114   K 4.4 10/27/2015 0806   K 4.3 08/28/2015 1114   CL 106 08/28/2015 1114   CL 101 10/23/2012 0802   CO2 24 10/27/2015 0806   CO2 27 08/28/2015 1114   BUN 15.2 10/27/2015 0806   BUN 17 08/28/2015 1114   CREATININE 1.2 10/27/2015 0806   CREATININE 1.10 08/28/2015 1114   CREATININE 1.3 09/18/2012 1252      Component Value Date/Time   CALCIUM 9.3 10/27/2015 0806   CALCIUM 8.6 08/28/2015 1114   ALKPHOS 70 10/27/2015 0806   ALKPHOS 62 08/28/2015 1114   AST 18 10/27/2015 0806   AST 18 08/28/2015 1114   ALT 12 10/27/2015 0806   ALT 14 08/28/2015 1114   BILITOT 0.50 10/27/2015 0806   BILITOT 0.7 08/28/2015 1114      Results for JESIAH, GRISMER (MRN [id number]) as of 11/03/2015 08:04  Ref. Range 10/31/2013 15:05 11/03/2014 08:09 10/27/2015 08:06  IgG (Immunoglobin G), Serum Latest Ref Range: 609 771 4567 mg/dL 537 (L) 581 (L) 570 (L)  IgA Latest Ref Range: 68-379 mg/dL 309 298 283  IgM, Serum Latest Ref Range: 41-251 mg/dL 15 (L) 14 (L) 11 (L)    Impression and Plan:   74 year old gentleman with the following issues:  1. IgA lambda MGUS: His protein studies obtained on 10/27/2015 were reviewed today and discussed with the patient. These have not really changed since 2004 and has not had any evidence to suggest end organ damage. Plan is to continue  with active surveillance on an annual basis.   2. Diabetes mellitus: Continue glyburide 5 mg daily, metformin 500 mg four times daily, per PCP.  3. Hypertension: Continue Hydrochlorothiazide 25 mg daily; Quinapril 40 mg daily.   4. Follow-up: Will be in one year sooner if there is a problem.  [facility], MD 12/13/20168:58 AM

## 2015-11-04 ENCOUNTER — Other Ambulatory Visit: Payer: Medicare Other

## 2015-11-08 ENCOUNTER — Other Ambulatory Visit: Payer: Self-pay | Admitting: Physician Assistant

## 2015-11-11 ENCOUNTER — Ambulatory Visit: Payer: Medicare Other | Admitting: Oncology

## 2015-11-16 ENCOUNTER — Encounter: Payer: Self-pay | Admitting: *Deleted

## 2015-11-30 ENCOUNTER — Ambulatory Visit: Payer: Self-pay | Admitting: Physician Assistant

## 2015-11-30 ENCOUNTER — Encounter: Payer: Self-pay | Admitting: Internal Medicine

## 2015-11-30 ENCOUNTER — Ambulatory Visit (INDEPENDENT_AMBULATORY_CARE_PROVIDER_SITE_OTHER): Payer: Medicare Other | Admitting: Internal Medicine

## 2015-11-30 VITALS — BP 110/58 | HR 62 | Temp 98.0°F | Resp 16 | Ht 71.0 in | Wt 188.0 lb

## 2015-11-30 DIAGNOSIS — N400 Enlarged prostate without lower urinary tract symptoms: Secondary | ICD-10-CM

## 2015-11-30 DIAGNOSIS — Z0001 Encounter for general adult medical examination with abnormal findings: Secondary | ICD-10-CM | POA: Diagnosis not present

## 2015-11-30 DIAGNOSIS — Z79899 Other long term (current) drug therapy: Secondary | ICD-10-CM

## 2015-11-30 DIAGNOSIS — G587 Mononeuritis multiplex: Secondary | ICD-10-CM

## 2015-11-30 DIAGNOSIS — I10 Essential (primary) hypertension: Secondary | ICD-10-CM

## 2015-11-30 DIAGNOSIS — E349 Endocrine disorder, unspecified: Secondary | ICD-10-CM

## 2015-11-30 DIAGNOSIS — E782 Mixed hyperlipidemia: Secondary | ICD-10-CM | POA: Diagnosis not present

## 2015-11-30 DIAGNOSIS — E1141 Type 2 diabetes mellitus with diabetic mononeuropathy: Secondary | ICD-10-CM

## 2015-11-30 DIAGNOSIS — E1129 Type 2 diabetes mellitus with other diabetic kidney complication: Secondary | ICD-10-CM | POA: Diagnosis not present

## 2015-11-30 DIAGNOSIS — I480 Paroxysmal atrial fibrillation: Secondary | ICD-10-CM

## 2015-11-30 DIAGNOSIS — E559 Vitamin D deficiency, unspecified: Secondary | ICD-10-CM | POA: Diagnosis not present

## 2015-11-30 DIAGNOSIS — Z6826 Body mass index (BMI) 26.0-26.9, adult: Secondary | ICD-10-CM

## 2015-11-30 DIAGNOSIS — R6889 Other general symptoms and signs: Secondary | ICD-10-CM | POA: Diagnosis not present

## 2015-11-30 DIAGNOSIS — D649 Anemia, unspecified: Secondary | ICD-10-CM

## 2015-11-30 DIAGNOSIS — Z0000 Encounter for general adult medical examination without abnormal findings: Secondary | ICD-10-CM

## 2015-11-30 DIAGNOSIS — E1121 Type 2 diabetes mellitus with diabetic nephropathy: Secondary | ICD-10-CM

## 2015-11-30 DIAGNOSIS — D472 Monoclonal gammopathy: Secondary | ICD-10-CM

## 2015-11-30 LAB — CBC WITH DIFFERENTIAL/PLATELET
Basophils Absolute: 0 10*3/uL (ref 0.0–0.1)
Basophils Relative: 0 % (ref 0–1)
Eosinophils Absolute: 0.1 10*3/uL (ref 0.0–0.7)
Eosinophils Relative: 1 % (ref 0–5)
HEMATOCRIT: 41.1 % (ref 39.0–52.0)
HEMOGLOBIN: 14.2 g/dL (ref 13.0–17.0)
LYMPHS PCT: 35 % (ref 12–46)
Lymphs Abs: 1.8 10*3/uL (ref 0.7–4.0)
MCH: 28.7 pg (ref 26.0–34.0)
MCHC: 34.5 g/dL (ref 30.0–36.0)
MCV: 83.2 fL (ref 78.0–100.0)
MONO ABS: 0.5 10*3/uL (ref 0.1–1.0)
MPV: 10 fL (ref 8.6–12.4)
Monocytes Relative: 10 % (ref 3–12)
NEUTROS ABS: 2.7 10*3/uL (ref 1.7–7.7)
NEUTROS PCT: 54 % (ref 43–77)
Platelets: 212 10*3/uL (ref 150–400)
RBC: 4.94 MIL/uL (ref 4.22–5.81)
RDW: 12.8 % (ref 11.5–15.5)
WBC: 5 10*3/uL (ref 4.0–10.5)

## 2015-11-30 LAB — BASIC METABOLIC PANEL WITH GFR
BUN: 19 mg/dL (ref 7–25)
CALCIUM: 9.6 mg/dL (ref 8.6–10.3)
CHLORIDE: 100 mmol/L (ref 98–110)
CO2: 26 mmol/L (ref 20–31)
Creat: 1.17 mg/dL (ref 0.70–1.18)
GFR, EST NON AFRICAN AMERICAN: 61 mL/min (ref >=60)
GFR, Est African American: 71 mL/min (ref >=60)
GLUCOSE: 153 mg/dL — AB (ref 65–99)
POTASSIUM: 4.4 mmol/L (ref 3.5–5.3)
SODIUM: 139 mmol/L (ref 135–146)

## 2015-11-30 LAB — HEPATIC FUNCTION PANEL
ALK PHOS: 62 U/L (ref 40–115)
ALT: 14 U/L (ref 9–46)
AST: 17 U/L (ref 10–35)
Albumin: 4 g/dL (ref 3.6–5.1)
BILIRUBIN DIRECT: 0.1 mg/dL (ref <=0.2)
BILIRUBIN INDIRECT: 0.6 mg/dL (ref 0.2–1.2)
TOTAL PROTEIN: 6.3 g/dL (ref 6.1–8.1)
Total Bilirubin: 0.7 mg/dL (ref 0.2–1.2)

## 2015-11-30 LAB — LIPID PANEL
CHOL/HDL RATIO: 2.3 ratio (ref <=5.0)
Cholesterol: 120 mg/dL — ABNORMAL LOW (ref 125–200)
HDL: 53 mg/dL (ref >=40)
LDL Cholesterol: 48 mg/dL (ref <130)
Triglycerides: 96 mg/dL (ref <150)
VLDL: 19 mg/dL (ref <30)

## 2015-11-30 LAB — HEMOGLOBIN A1C
Hgb A1c MFr Bld: 6.4 % — ABNORMAL HIGH (ref <5.7)
Mean Plasma Glucose: 137 mg/dL — ABNORMAL HIGH (ref <117)

## 2015-11-30 LAB — TSH: TSH: 0.531 u[IU]/mL (ref 0.350–4.500)

## 2015-11-30 NOTE — Progress Notes (Signed)
Patient ID: Earl [name], male   DOB: 18-Mar-1941, 75 y.o.   MRN: [id number]  MEDICARE ANNUAL WELLNESS VISIT AND FOLLOW UP Assessment:    1. Essential hypertension -DASH diet -stop HCTZ -cont other medication -monitor at home - TSH  2. Type 2 diabetes mellitus with diabetic nephropathy, without long-term current use of insulin (HCC) -cont meds -check BS - Hemoglobin A1c  3. Hyperlipidemia  - Lipid panel  4. Vitamin D deficiency -cont Vit D  5. Medication management  - CBC with Differential/Platelet - BASIC METABOLIC PANEL WITH GFR - Hepatic function panel  6. Paroxysmal atrial fibrillation (HCC) -cont meds -cont asa  7. Diabetic mononeuritis multiplex, right abdomen   8. BPH (benign prostatic hyperplasia) -cont finasteride  9. MGUS (monoclonal gammopathy of unknown significance) -followed by Dr. Alen Blew  10. Anemia, unspecified   11. Testosterone deficiency -refused replacement therapy  12. BMI 26.53,adult -weight counseling given  13. Medicare annual wellness visit, subsequent      Over 30 minutes of exam, counseling, chart review, and critical decision making was performed  Plan:   During the course of the visit the patient was educated and counseled about appropriate screening and preventive services including:    Pneumococcal vaccine   Influenza vaccine  Prevnar 13  Td vaccine  Screening electrocardiogram  Colorectal cancer screening  Diabetes screening  Glaucoma screening  Nutrition counseling   Conditions/risks identified: BMI: Discussed weight loss, diet, and increase physical activity.  Increase physical activity: AHA recommends 150 minutes of physical activity a week.  Medications reviewed Diabetes is at goal, ACE/ARB therapy: Yes. Urinary Incontinence is not an issue: discussed non pharmacology and pharmacology options.  Fall risk: low- discussed PT, home fall assessment, medications.    Subjective:  Earl [name] is a 75 y.o. male who presents for Medicare Annual Wellness Visit and 3 month follow up for HTN, hyperlipidemia, diabetes, and vitamin D Def.  Date of last medicare wellness visit was is unknown.  His blood pressure has not been controlled at home, today their BP is BP: (!) 110/58 mmHg He does workout. He denies chest pain, shortness of breath, dizziness.   He reports that he never checks his blood pressure at home.  He is doing walking and also is doing yardwork daily.  He is on cholesterol medication and denies myalgias. His cholesterol is at goal. The cholesterol last visit was:   Lab Results  Component Value Date   CHOL 102* 08/28/2015   HDL 46 08/28/2015   LDLCALC 44 08/28/2015   TRIG 61 08/28/2015   CHOLHDL 2.2 08/28/2015   He has been working on diet and exercise for diabetes, and denies foot ulcerations, hyperglycemia, hypoglycemia , increased appetite, nausea, polydipsia, polyuria, visual disturbances, vomiting and weight loss. Last A1C in the office was:  Lab Results  Component Value Date   HGBA1C 6.6* 08/28/2015  he does admit to some sensation changes in his feet.  He reports that he doesn't feel like the sensation changes are helped with gabapentin.    Last GFR NonAA   Lab Results  Component Value Date   [facility] 66 08/28/2015   AA  Lab Results  Component Value Date   GFRAA 76 08/28/2015   Patient is on Vitamin D supplement.   Lab Results  Component Value Date   VD25OH 99 08/28/2015     He reports that he is not sure why he is taking gabapentin.  He reports that it doesn't help him sleep and he also feels  like he is not having any pain.    He reports that he does want to go back to taking a 1/2 tablet of finasteride because he feels like it doesn't help much.   He is worried that he is having some issues with inappropriate sensation in the bilateral lateral legs.  He thinks that it may be coming from his metformin.    Medication Review: Current  Outpatient Prescriptions on File Prior to Visit  Medication Sig Dispense Refill  . aspirin 81 MG tablet Take 81 mg by mouth daily.    [name] atenolol (TENORMIN) 100 MG tablet Take 1 tablet by mouth  daily 90 tablet 3  . Cholecalciferol (VITAMIN D PO) Take 5,000 Units by mouth daily.    . diazepam (VALIUM) 2 MG tablet Take 1 tablet (2 mg total) by mouth every 8 (eight) hours as needed for muscle spasms. 30 tablet 0  . finasteride (PROSCAR) 5 MG tablet TAKE 1 TABLET BY MOUTH  EVERY DAY FOR PROSTATE 90 tablet 1  . glipiZIDE (GLUCOTROL) 5 MG tablet Take 1 tablet by mouth 3  times a day before meals  for diabetes 270 tablet 2  . hydrochlorothiazide (HYDRODIURIL) 25 MG tablet Take 1 tablet by mouth  every day for blood  pressure and fluid 90 tablet 1  . levothyroxine (SYNTHROID, LEVOTHROID) 50 MCG tablet Take 1 tablet by mouth  daily before breakfast 90 tablet 1  . Magnesium 250 MG TABS Take 250 mg by mouth daily.    . metFORMIN (GLUCOPHAGE-XR) 500 MG 24 hr tablet TAKE 1 TABLET BY MOUTH 4  TIMES DAILY FOR DIABETES 360 tablet 1  . Multiple Vitamin (MULTIVITAMIN) capsule Take 1 capsule by mouth daily.      . potassium chloride (KLOR-CON) 10 MEQ CR tablet Take 10 mEq by mouth daily.      . quinapril (ACCUPRIL) 40 MG tablet Take 1 tablet by mouth  every day for blood  pressure 90 tablet 3  . simvastatin (ZOCOR) 40 MG tablet TAKE 1 TABLET BY MOUTH AT  BEDTIME OR AS DIRECTED FOR  CHOLESTEROL 90 tablet 1  . vitamin C (ASCORBIC ACID) 500 MG tablet Take 500 mg by mouth daily.       No current facility-administered medications on file prior to visit.    Current Problems (verified) Patient Active Problem List   Diagnosis Date Noted  . BMI 26.53,adult 08/28/2015  . Medicare annual wellness visit, subsequent 08/28/2015  . 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 Vitamin D deficiency 04/29/2014  . Medication management 01/03/2014  . 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 BPH (benign prostatic  hyperplasia)   . Testosterone deficiency   . 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 Anemia, unspecified 11/07/2013  . Hypertension 11/07/2013  . Hyperlipidemia 11/07/2013  . Paroxysmal atrial fibrillation ([place]) 09/18/2012    Screening Tests Immunization History  Administered Date(s) Administered  . DT 02/09/2015  . Influenza Split 08/07/2013  . Influenza, High Dose Seasonal PF 08/19/2014, 08/28/2015  . Pneumococcal Polysaccharide-23 08/13/2008  . Td 11/22/2003    Preventative care: Last colonoscopy: 2013  Prior vaccinations: TD or Tdap: 2016  Influenza: 2016  Pneumococcal: 2009 Prevnar13: decline Shingles/Zostavax: declined  Names of Other Physician/Practitioners you currently use: 1. [facility] here for primary care 2. Delman Cheadle, eye doctor, last visit 2016 Oct 3. Lavone Neri, dentist, next visit in 1 week Patient Care Team: [name], MD as PCP - General (Internal Medicine)  Inda Castle, MD as Consulting Physician (Gastroenterology) Wyatt Portela, MD as Consulting Physician (Oncology) Sharyne Peach, MD as Consulting Physician (Ophthalmology)  Past Surgical History  Procedure Laterality Date  . Inguinal hernia repair      1968 and 2004   Family History  Problem Relation Age of Onset  . Colon cancer Neg Hx   . Sickle cell anemia Mother   . Diabetes Father   . Stomach cancer Father   . CAD Neg Hx    Social History  Substance Use Topics  . Smoking status: Never Smoker   . Smokeless tobacco: Never Used  . Alcohol Use: No    MEDICARE WELLNESS OBJECTIVES: Tobacco use: He does not smoke.  Patient is not a former smoker. If yes, counseling given Alcohol Current alcohol use: none Osteoporosis: hypogonadism, History of fracture in the past year: no Fall risk: Minimal risk Hearing: normal Visual acuity: normal,  does perform annual eye exam Diet: in general, a "healthy" diet   Physical  activity: Current Exercise Habits:: Home exercise routine, Type of exercise: walking, Time (Minutes): 45, Frequency (Times/Week): 4, Weekly Exercise (Minutes/Week): 180, Intensity: Moderate Cardiac risk factors:   Depression/mood screen:   Depression screen [facility] 2/9 11/30/2015  Decreased Interest 0  Down, Depressed, Hopeless 0  PHQ - 2 Score 0    ADLs:  In your present state of health, do you have any difficulty performing the following activities: 11/30/2015 08/28/2015  Hearing? N N  Vision? N N  Difficulty concentrating or making decisions? N N  Walking or climbing stairs? N N  Dressing or bathing? N N  Doing errands, shopping? N N  Preparing Food and eating ? N -  Using the Toilet? N -  In the past six months, have you accidently leaked urine? N -  Do you have problems with loss of bowel control? N -  Managing your Medications? N -  Managing your Finances? N -  Housekeeping or managing your Housekeeping? N -     Cognitive Testing  Alert? Yes  Normal Appearance?Yes  Oriented to person? Yes  Place? Yes   Time? Yes  Recall of three objects?  Yes  Can perform simple calculations? Yes  Displays appropriate judgment?Yes  Can read the correct time from a watch face?Yes  EOL planning: Does patient have an advance directive?: No Would patient like information on creating an advanced directive?: No - patient declined information   Objective:   Today's Vitals   11/30/15 1054  BP: 110/58  Pulse: 62  Temp: 98 F (36.7 C)  TempSrc: Temporal  Resp: 16  Height: 5\' 11"  (1.803 m)  Weight: 188 lb (85.276 kg)   Body mass index is 26.23 kg/(m^2).  General appearance: alert, no distress, WD/WN, male HEENT: normocephalic, sclerae anicteric, TMs pearly, nares patent, no discharge or erythema, pharynx normal Oral cavity: MMM, no lesions Neck: supple, no lymphadenopathy, no thyromegaly, no masses Heart: RRR, normal S1, S2, no murmurs Lungs: CTA bilaterally, no wheezes, rhonchi, or  rales Abdomen: +bs, soft, non tender, non distended, no masses, no hepatomegaly, no splenomegaly Musculoskeletal: nontender, no swelling, no obvious deformity Extremities: no edema, no cyanosis, no clubbing Pulses: 2+ symmetric, upper and lower extremities, normal cap refill Neurological: alert, oriented x 3, CN2-12 intact, strength normal upper extremities and lower extremities, sensation normal throughout, DTRs 2+ throughout, no cerebellar signs, gait normal Psychiatric: normal affect, behavior normal, pleasant   Medicare Attestation I have personally reviewed: The patient's medical and social history Their  use of alcohol, tobacco or illicit drugs Their current medications and supplements The patient's functional ability including ADLs,fall risks, home safety risks, cognitive, and hearing and visual impairment Diet and physical activities Evidence for depression or mood disorders  The patient's weight, height, BMI, and visual acuity have been recorded in the chart.  I have made referrals, counseling, and provided education to the patient based on review of the above and I have provided the patient with a written personalized care plan for preventive services.     Starlyn Skeans, PA-C   11/30/2015

## 2015-11-30 NOTE — Progress Notes (Deleted)
Patient ID: Earl [name], male   DOB: 1941-09-10, 75 y.o.   MRN: [id number]  Assessment and Plan:  Hypertension:  -Stop HCTZ -Continue medication,  -monitor blood pressure at home.  -Continue DASH diet.   -Reminder to go to the ER if any CP, SOB, nausea, dizziness, severe HA, changes vision/speech, left arm numbness and tingling, and jaw pain.  Cholesterol: -cont medications -Continue diet and exercise.  -Check cholesterol.   Diabetes, controlled: -diet and exercise -Continue diet and exercise.  -Check A1C  Vitamin D Def: -check level  -continue medications.   Insomnia vs. Inappropriate leg sensation -cut gabapentin back to 1 tablet at night, if no help stop medication completely  Continue diet and meds as discussed. Further disposition pending results of labs.  HPI 75 y.o. male  presents for 3 month follow up with hypertension, hyperlipidemia, prediabetes and vitamin D.   His blood pressure has not been controlled at home, today their BP is BP: (!) 110/58 mmHg.   He does not workout. He denies chest pain, shortness of breath, dizziness.   He is on cholesterol medication and denies myalgias. His cholesterol is at goal. The cholesterol last visit was:   Lab Results  Component Value Date   CHOL 102* 08/28/2015   HDL 46 08/28/2015   LDLCALC 44 08/28/2015   TRIG 61 08/28/2015   CHOLHDL 2.2 08/28/2015     He has been working on diet and exercise for prediabetes, and denies foot ulcerations, hyperglycemia, hypoglycemia , increased appetite, nausea, paresthesia of the feet, polydipsia, polyuria, visual disturbances, vomiting and weight loss. Last A1C in the office was:  Lab Results  Component Value Date   HGBA1C 6.6* 08/28/2015    Patient is on Vitamin D supplement.  Lab Results  Component Value Date   VD25OH 99 08/28/2015     He reports that he is not sure why he is taking gabapentin.  He reports that it doesn't help him sleep and he also feels like he is not  having any pain.    He reports that he does want to go back to taking a 1/2 tablet of finasteride because he feels like it doesn't help much.   He is worried that he is having some issues with inappropriate sensation in the bilateral lateral legs.  He thinks that it may be coming from his metformin.    Current Medications:  Current Outpatient Prescriptions on File Prior to Visit  Medication Sig Dispense Refill  . aspirin 81 MG tablet Take 81 mg by mouth daily.    [name] atenolol (TENORMIN) 100 MG tablet Take 1 tablet by mouth  daily 90 tablet 3  . Cholecalciferol (VITAMIN D PO) Take 5,000 Units by mouth daily.    . diazepam (VALIUM) 2 MG tablet Take 1 tablet (2 mg total) by mouth every 8 (eight) hours as needed for muscle spasms. 30 tablet 0  . finasteride (PROSCAR) 5 MG tablet TAKE 1 TABLET BY MOUTH  EVERY DAY FOR PROSTATE 90 tablet 1  . gabapentin (NEURONTIN) 300 MG capsule TAKE 1 TO 2 CAPSULES BY  MOUTH AT NIGHT FOR PAIN 180 capsule 1  . glipiZIDE (GLUCOTROL) 5 MG tablet Take 1 tablet by mouth 3  times a day before meals  for diabetes 270 tablet 2  . hydrochlorothiazide (HYDRODIURIL) 25 MG tablet Take 1 tablet by mouth  every day for blood  pressure and fluid 90 tablet 1  . levothyroxine (SYNTHROID, LEVOTHROID) 50 MCG tablet Take 1 tablet by mouth  daily before breakfast 90 tablet 1  . Magnesium 250 MG TABS Take 250 mg by mouth daily.    . metFORMIN (GLUCOPHAGE-XR) 500 MG 24 hr tablet TAKE 1 TABLET BY MOUTH 4  TIMES DAILY FOR DIABETES 360 tablet 1  . Multiple Vitamin (MULTIVITAMIN) capsule Take 1 capsule by mouth daily.      . potassium chloride (KLOR-CON) 10 MEQ CR tablet Take 10 mEq by mouth daily.      . quinapril (ACCUPRIL) 40 MG tablet Take 1 tablet by mouth  every day for blood  pressure 90 tablet 3  . simvastatin (ZOCOR) 40 MG tablet TAKE 1 TABLET BY MOUTH AT  BEDTIME OR AS DIRECTED FOR  CHOLESTEROL 90 tablet 1  . vitamin C (ASCORBIC ACID) 500 MG tablet Take 500 mg by mouth daily.        No current facility-administered medications on file prior to visit.    Medical History:  Past Medical History  Diagnosis Date  . Diabetes mellitus ([place])   . Hypertension   . Hyperlipidemia   . Atrial fibrillation ([place])   . Type II or unspecified type diabetes mellitus without mention of complication, not stated as uncontrolled   . BPH (benign prostatic hyperplasia)   . Other testicular hypofunction     Allergies:  Allergies  Allergen Reactions  . Penicillins Other (See Comments)    Patient stated,"I was told not to take Penicillin anymore. I don't remember what kind of reaction I had."     Review of Systems:  Review of Systems  Constitutional: Negative for fever, chills and malaise/fatigue.  HENT: Negative for congestion, ear pain and sore throat.   Eyes: Negative.   Respiratory: Negative for cough, shortness of breath and wheezing.   Cardiovascular: Negative for chest pain, palpitations and leg swelling.  Gastrointestinal: Negative for heartburn, abdominal pain, diarrhea, constipation, blood in stool and melena.  Genitourinary: Positive for urgency. Negative for dysuria, frequency and hematuria.  Musculoskeletal: Negative.   Skin: Negative.   Neurological: Negative.  Negative for headaches.  Psychiatric/Behavioral: Negative for depression. The patient has insomnia. The patient is not nervous/anxious.     Family history- Review and unchanged  Social history- Review and unchanged  Physical Exam: BP 110/58 mmHg  Pulse 62  Temp(Src) 98 F (36.7 C) (Temporal)  Resp 16  Ht 5\' 11"  (1.803 m)  Wt 188 lb (85.276 kg)  BMI 26.23 kg/m2 Wt Readings from Last 3 Encounters:  11/30/15 188 lb (85.276 kg)  11/03/15 191 lb 6.4 oz (86.818 kg)  08/28/15 190 lb 3.2 oz (86.274 kg)    General Appearance: Well nourished well developed, in no apparent distress. Eyes: PERRLA, EOMs, conjunctiva no swelling or erythema ENT/Mouth: Ear canals normal without obstruction, swelling,  erythma, discharge.  TMs normal bilaterally.  Oropharynx moist, clear, without exudate, or postoropharyngeal swelling. Neck: Supple, thyroid normal,no cervical adenopathy  Respiratory: Respiratory effort normal, Breath sounds clear A&P without rhonchi, wheeze, or rale.  No retractions, no accessory usage. Cardio: RRR with no MRGs. Brisk peripheral pulses without edema.  Abdomen: Soft, + BS,  Non tender, no guarding, rebound, hernias, masses. Musculoskeletal: Full ROM, 5/5 strength, Normal gait Skin: Warm, dry without rashes, lesions, ecchymosis.  Neuro: Awake and oriented X 3, Cranial nerves intact. Normal muscle tone, no cerebellar symptoms. Psych: Normal affect, Insight and Judgment appropriate.    Starlyn Skeans, PA-C 11:09 AM [facility]

## 2016-01-25 ENCOUNTER — Other Ambulatory Visit: Payer: Self-pay | Admitting: Internal Medicine

## 2016-02-26 ENCOUNTER — Encounter: Payer: Self-pay | Admitting: Internal Medicine

## 2016-03-01 ENCOUNTER — Encounter: Payer: Self-pay | Admitting: Internal Medicine

## 2016-03-01 ENCOUNTER — Ambulatory Visit (INDEPENDENT_AMBULATORY_CARE_PROVIDER_SITE_OTHER): Payer: Medicare Other | Admitting: Internal Medicine

## 2016-03-01 VITALS — BP 120/82 | HR 60 | Temp 97.4°F | Resp 16 | Ht 71.0 in | Wt 191.2 lb

## 2016-03-01 DIAGNOSIS — E559 Vitamin D deficiency, unspecified: Secondary | ICD-10-CM

## 2016-03-01 DIAGNOSIS — N182 Chronic kidney disease, stage 2 (mild): Secondary | ICD-10-CM | POA: Diagnosis not present

## 2016-03-01 DIAGNOSIS — E782 Mixed hyperlipidemia: Secondary | ICD-10-CM

## 2016-03-01 DIAGNOSIS — R6889 Other general symptoms and signs: Secondary | ICD-10-CM | POA: Diagnosis not present

## 2016-03-01 DIAGNOSIS — E039 Hypothyroidism, unspecified: Secondary | ICD-10-CM

## 2016-03-01 DIAGNOSIS — Z79899 Other long term (current) drug therapy: Secondary | ICD-10-CM | POA: Diagnosis not present

## 2016-03-01 DIAGNOSIS — E349 Endocrine disorder, unspecified: Secondary | ICD-10-CM

## 2016-03-01 DIAGNOSIS — Z1212 Encounter for screening for malignant neoplasm of rectum: Secondary | ICD-10-CM

## 2016-03-01 DIAGNOSIS — Z136 Encounter for screening for cardiovascular disorders: Secondary | ICD-10-CM | POA: Diagnosis not present

## 2016-03-01 DIAGNOSIS — E291 Testicular hypofunction: Secondary | ICD-10-CM | POA: Diagnosis not present

## 2016-03-01 DIAGNOSIS — D472 Monoclonal gammopathy: Secondary | ICD-10-CM

## 2016-03-01 DIAGNOSIS — Z0001 Encounter for general adult medical examination with abnormal findings: Secondary | ICD-10-CM

## 2016-03-01 DIAGNOSIS — E1129 Type 2 diabetes mellitus with other diabetic kidney complication: Secondary | ICD-10-CM | POA: Diagnosis not present

## 2016-03-01 DIAGNOSIS — Z0000 Encounter for general adult medical examination without abnormal findings: Secondary | ICD-10-CM | POA: Diagnosis not present

## 2016-03-01 DIAGNOSIS — E1122 Type 2 diabetes mellitus with diabetic chronic kidney disease: Secondary | ICD-10-CM

## 2016-03-01 DIAGNOSIS — I10 Essential (primary) hypertension: Secondary | ICD-10-CM | POA: Diagnosis not present

## 2016-03-01 DIAGNOSIS — Z125 Encounter for screening for malignant neoplasm of prostate: Secondary | ICD-10-CM

## 2016-03-01 DIAGNOSIS — I480 Paroxysmal atrial fibrillation: Secondary | ICD-10-CM | POA: Diagnosis not present

## 2016-03-01 LAB — BASIC METABOLIC PANEL WITH GFR
BUN: 15 mg/dL (ref 7–25)
CALCIUM: 8.8 mg/dL (ref 8.6–10.3)
CO2: 25 mmol/L (ref 20–31)
CREATININE: 1.09 mg/dL (ref 0.70–1.18)
Chloride: 106 mmol/L (ref 98–110)
GFR, EST AFRICAN AMERICAN: 77 mL/min (ref >=60)
GFR, EST NON AFRICAN AMERICAN: 67 mL/min (ref >=60)
Glucose, Bld: 137 mg/dL — ABNORMAL HIGH (ref 65–99)
Potassium: 4.3 mmol/L (ref 3.5–5.3)
SODIUM: 139 mmol/L (ref 135–146)

## 2016-03-01 LAB — HEPATIC FUNCTION PANEL
ALBUMIN: 3.8 g/dL (ref 3.6–5.1)
ALT: 15 U/L (ref 9–46)
AST: 20 U/L (ref 10–35)
Alkaline Phosphatase: 56 U/L (ref 40–115)
BILIRUBIN DIRECT: 0.2 mg/dL (ref <=0.2)
BILIRUBIN TOTAL: 0.7 mg/dL (ref 0.2–1.2)
Indirect Bilirubin: 0.5 mg/dL (ref 0.2–1.2)
Total Protein: 5.9 g/dL — ABNORMAL LOW (ref 6.1–8.1)

## 2016-03-01 LAB — CBC WITH DIFFERENTIAL/PLATELET
BASOS ABS: 0 {cells}/uL (ref 0–200)
Basophils Relative: 0 %
EOS PCT: 2 %
Eosinophils Absolute: 94 cells/uL (ref 15–500)
HCT: 38.2 % — ABNORMAL LOW (ref 38.5–50.0)
Hemoglobin: 13.2 g/dL (ref 13.2–17.1)
LYMPHS ABS: 2162 {cells}/uL (ref 850–3900)
Lymphocytes Relative: 46 %
MCH: 28.9 pg (ref 27.0–33.0)
MCHC: 34.6 g/dL (ref 32.0–36.0)
MCV: 83.6 fL (ref 80.0–100.0)
MONOS PCT: 11 %
MPV: 9.9 fL (ref 7.5–12.5)
Monocytes Absolute: 517 cells/uL (ref 200–950)
NEUTROS ABS: 1927 {cells}/uL (ref 1500–7800)
NEUTROS PCT: 41 %
PLATELETS: 185 10*3/uL (ref 140–400)
RBC: 4.57 MIL/uL (ref 4.20–5.80)
RDW: 14.2 % (ref 11.0–15.0)
WBC: 4.7 10*3/uL (ref 3.8–10.8)

## 2016-03-01 LAB — LIPID PANEL
CHOLESTEROL: 107 mg/dL — AB (ref 125–200)
HDL: 53 mg/dL (ref >=40)
LDL CALC: 44 mg/dL (ref <130)
Total CHOL/HDL Ratio: 2 Ratio (ref <=5.0)
Triglycerides: 50 mg/dL (ref <150)
VLDL: 10 mg/dL (ref <30)

## 2016-03-01 LAB — HEMOGLOBIN A1C
HEMOGLOBIN A1C: 6.2 % — AB (ref <5.7)
Mean Plasma Glucose: 131 mg/dL

## 2016-03-01 LAB — MAGNESIUM: MAGNESIUM: 1.9 mg/dL (ref 1.5–2.5)

## 2016-03-01 LAB — TSH: TSH: 0.82 m[IU]/L (ref 0.40–4.50)

## 2016-03-01 NOTE — Progress Notes (Signed)
Patient ID: Earl [name], male   DOB: [birth year], 75 y.o.   MRN: [id number]  Annual  Screening/Preventative Visit And Comprehensive Evaluation & Examination  This very nice 75 y.o.male MBM presents for a Wellness/Preventative Visit & comprehensive evaluation and management of multiple medical co-morbidities.  Patient has been followed for HTN, T2_NIDDM, Hyperlipidemia and Vitamin D Deficiency. 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    HTN predates since 75. Patient's BP has been controlled at home.Today's BP: 120/82 mmHg. Patient denies any cardiac symptoms as chest pain, palpitations, shortness of breath, dizziness or ankle swelling.   Patient's hyperlipidemia is controlled with diet and medications. Patient denies myalgias or other medication SE's. Last lipids were at goal with Cholesterol 120*; HDL 53; LDL 48; Triglycerides 96 on 11/30/2015.    Patient has T2_NIDDM since 1994 and patient denies reactive hypoglycemic symptoms, visual blurring, diabetic polys or paresthesias. He continues to monitor his diabetes with bid DU checks and has declined to use a glucometer as long as his DU's remain negative. Last A1c was  6.4% on 11/30/2015.     Finally, patient has history of Vitamin D Deficiency of "30" in 2008  and last vitamin D was 99 on 08/28/2015.    Medication Sig  . aspirin 81 MG Take 81 mg by mouth daily.  [name] atenolol 100 MG  Take 1 tablet by mouth  daily  . VITAMIN D Take 5,000 Units by mouth daily.  . finasteride  5 MG TAKE 1 TABLET BY MOUTH  EVERY DAY FOR PROSTATE  . glipiZIDE  5 MG  Take 1 tablet by mouth 3  times daily before meals  for diabetes  . Levothyroxine 50 MCG Take 1 tablet by mouth  daily before breakfast  . Magnesium 250 MG  Take 250 mg by mouth daily.  . metFORMIN-XR 500 MG  TAKE 1 TABLET BY MOUTH 4  TIMES DAILY FOR DIABETES  . Multiple Vitamin  Take 1 capsule by mouth daily.    . potassium chloride10 MEQ  Take 10 mEq by mouth daily.    . quinapril  40 MG   Take 1 tablet by mouth  every day for blood  pressure  . simvastatin  40 MG  TAKE 1 TABLET BY MOUTH AT  BEDTIME OR AS DIRECTED FOR  CHOLESTEROL  . vitamin C ) 500 MG Take 500 mg by mouth daily.    . hctz 25 MG  Take 1 tablet by mouth  every day for blood  pressure and fluid (Patient not taking: Reported on 03/01/2016)   Allergies  Allergen Reactions  . Penicillins Other (See Comments)    Patient stated,"I was told not to take Penicillin anymore. I don't remember what kind of reaction I had."   Past Medical History  Diagnosis Date  . Diabetes mellitus ([place])   . Hypertension   . Hyperlipidemia   . Atrial fibrillation ([place])   . Type II or unspecified type diabetes mellitus without mention of complication, not stated as uncontrolled   . BPH (benign prostatic hyperplasia)   . Other testicular hypofunction    Health Maintenance  Topic Date Due  . OPHTHALMOLOGY EXAM  04/29/1951  . ZOSTAVAX  04/28/2001  . PNA vac Low Risk Adult (2 of 2 - PCV13) 08/13/2009  . HEMOGLOBIN A1C  05/29/2016  . INFLUENZA VACCINE  06/21/2016  . FOOT EXAM  08/27/2016  . COLONOSCOPY  09/07/2017  . TETANUS/TDAP  02/08/2025   Immunization History  Administered Date(s) Administered  .  DT 02/09/2015  . Influenza Split 08/07/2013  . Influenza, High Dose Seasonal PF 08/19/2014, 08/28/2015  . Pneumococcal Polysaccharide-23 08/13/2008  . Td 11/22/2003   Past Surgical History  Procedure Laterality Date  . Inguinal hernia repair      1968 and 2004   Family History  Problem Relation Age of Onset  . Colon cancer Neg Hx   . Sickle cell anemia Mother   . Diabetes Father   . Stomach cancer Father   . CAD Neg Hx     Social History   Social History  . Marital Status: Married    Spouse Name: N/A  . Number of Children: 2  . Years of Education: N/A   Occupational History  . Retired-Director of [employer]    Social History Main Topics  . Smoking status: Never Smoker   . Smokeless tobacco: Never Used   . Alcohol Use: No  . Drug Use: No  . Sexual Activity: Not on file   Other Topics Concern  . Not on file   Social History Narrative    ROS Constitutional: Denies fever, chills, weight loss/gain, headaches, insomnia,  night sweats or change in appetite. Does c/o fatigue. Eyes: Denies redness, blurred vision, diplopia, discharge, itchy or watery eyes.  ENT: Denies discharge, congestion, post nasal drip, epistaxis, sore throat, earache, hearing loss, dental pain, Tinnitus, Vertigo, Sinus pain or snoring.  Cardio: Denies chest pain, palpitations, irregular heartbeat, syncope, dyspnea, diaphoresis, orthopnea, PND, claudication or edema Respiratory: denies cough, dyspnea, DOE, pleurisy, hoarseness, laryngitis or wheezing.  Gastrointestinal: Denies dysphagia, heartburn, reflux, water brash, pain, cramps, nausea, vomiting, bloating, diarrhea, constipation, hematemesis, melena, hematochezia, jaundice or hemorrhoids Genitourinary: Denies dysuria, frequency, urgency, nocturia, hesitancy, discharge, hematuria or flank pain Musculoskeletal: Denies arthralgia, myalgia, stiffness, Jt. Swelling, pain, limp or strain/sprain. Denies Falls. Skin: Denies puritis, rash, hives, warts, acne, eczema or change in skin lesion Neuro: No weakness, tremor, incoordination, spasms, paresthesia or pain Psychiatric: Denies confusion, memory loss or sensory loss. Denies Depression. Endocrine: Denies change in weight, skin, hair change, nocturia, and paresthesia, diabetic polys, visual blurring or hyper / hypo glycemic episodes.  Heme/Lymph: No excessive bleeding, bruising or enlarged lymph nodes.  Physical Exam  BP 120/82 mmHg  Pulse 60  Temp(Src) 97.4 F (36.3 C)  Resp 16  Ht 5\' 11"  (1.803 m)  Wt 191 lb 3.2 oz (86.728 kg)  BMI 26.68 kg/m2  General Appearance: Well nourished, in no apparent distress.  Eyes: PERRLA, EOMs, conjunctiva no swelling or erythema, normal fundi and vessels. Sinuses: No  frontal/maxillary tenderness ENT/Mouth: EACs patent / TMs  nl. Nares clear without erythema, swelling, mucoid exudates. Oral hygiene is good. No erythema, swelling, or exudate. Tongue normal, non-obstructing. Tonsils not swollen or erythematous. Hearing normal.  Neck: Supple, thyroid normal. No bruits, nodes or JVD. Respiratory: Respiratory effort normal.  BS equal and clear bilateral without rales, rhonci, wheezing or stridor. Cardio: Heart sounds are normal with regular rate and rhythm and no murmurs, rubs or gallops. Peripheral pulses are normal and equal bilaterally without edema. No aortic or femoral bruits. Chest: symmetric with normal excursions and percussion.  Abdomen: Soft, with Nl bowel sounds. Nontender, no guarding, rebound, hernias, masses, or organomegaly.  Lymphatics: Non tender without lymphadenopathy.  Genitourinary: No hernias.Testes nl. DRE - prostate nl for age - smooth & firm w/o nodules. Musculoskeletal: Full ROM all peripheral extremities, joint stability, 5/5 strength, and normal gait. Skin: Warm and dry without rashes, lesions, cyanosis, clubbing or  ecchymosis.  Neuro: Cranial nerves  intact, reflexes equal bilaterally. Normal muscle tone, no cerebellar symptoms. Sensation intact by touch, Vibratory & Monofilament testing to the toes bilaterally.  Pysch: Alert and oriented X 3 with normal affect, insight and judgment appropriate.   Assessment and Plan  1. Annual Preventative/Screening Exam   - Microalbumin / creatinine urine ratio - EKG 12-Lead - [place], RETROPERITNL ABD,  LTD - POC Hemoccult Bld/Stl  - Urinalysis, Routine w reflex microscopic  - PSA - [facility] DIABETES FOOT EXAM - LOW EXTREMITY NEUR EXAM DOCUM - CBC with Differential/Platelet - BASIC METABOLIC PANEL WITH GFR - Hepatic function panel - Magnesium - Lipid panel - TSH - Hemoglobin A1c - Insulin, random - VITAMIN D 25 Hydroxy   2. Essential hypertension  - EKG 12-Lead - [place], RETROPERITNL ABD,  LTD -  TSH  3. Hyperlipidemia  - Lipid panel - TSH  4. Type 2 diabetes mellitus with stage 2 chronic kidney disease, without long-term current use of insulin (HCC)  - Microalbumin / creatinine urine ratio - [facility] DIABETES FOOT EXAM - LOW EXTREMITY NEUR EXAM DOCUM - Hemoglobin A1c - Insulin, random  5. Vitamin D deficiency  - VITAMIN D 25 Hydroxy  6. Hypothyroidism  - TSH  7. Testosterone deficiency   8. MGUS (monoclonal gammopathy of unknown significance)   9. Paroxysmal atrial fibrillation (HCC)   10. Screening for rectal cancer  - POC Hemoccult Bld/Stl   11. Prostate cancer screening  - PSA  12. Medication management  - Urinalysis, Routine w reflex microscopic  - CBC with Differential/Platelet - BASIC METABOLIC PANEL WITH GFR - Hepatic function panel - Magnesium  13. Screening for ischemic heart disease   14. Screening for AAA (aortic abdominal aneurysm)   Continue prudent diet as discussed, weight control, BP monitoring, regular exercise, and medications as discussed.  Discussed med effects and SE's. Routine screening labs and tests as requested with regular follow-up as recommended. Over 40 minutes of exam, counseling, chart review and high complex critical decision making was performed

## 2016-03-01 NOTE — Patient Instructions (Signed)
Recommend Adult Low Dose Aspirin or   coated  Aspirin 81 mg daily   To reduce risk of Colon Cancer 20 %,   Skin Cancer 26 % ,   Melanoma 46%   and   Pancreatic cancer 60%   ++++++++++++++++++++++++++++++++++++++++++++++++++++++ Vitamin D goal   is between 70-100.   Please make sure that you are taking your Vitamin D as directed.   It is very important as a natural anti-inflammatory   helping hair, skin, and nails, as well as reducing stroke and heart attack risk.   It helps your bones and helps with mood.  It also decreases numerous cancer risks so please take it as directed.   Low Vit D is associated with a 200-300% higher risk for CANCER   and 200-300% higher risk for HEART   ATTACK  &  STROKE.   ......................................  It is also associated with higher death rate at younger ages,   autoimmune diseases like Rheumatoid arthritis, Lupus, Multiple Sclerosis.     Also many other serious conditions, like depression, Alzheimer's  Dementia, infertility, muscle aches, fatigue, fibromyalgia - just to name a few.  ++++++++++++++++++++++++++++++++++++++++++++++++  Recommend the book "The END of DIETING" by Dr Joel Fuhrman   & the book "The END of DIABETES " by Dr Joel Fuhrman  At [employer] - get book & Audio CD's     Being diabetic has a  300% increased risk for heart attack, stroke, cancer, and alzheimer- type vascular dementia. It is very important that you work harder with diet by avoiding all foods that are white. Avoid white rice (brown & wild rice is OK), white potatoes (sweetpotatoes in moderation is OK), White bread or wheat bread or anything made out of white flour like bagels, donuts, rolls, buns, biscuits, cakes, pastries, cookies, pizza crust, and pasta (made from white flour & egg whites) - vegetarian pasta or spinach or wheat pasta is OK. Multigrain breads like Arnold's or [employer], or multigrain sandwich thins or flatbreads.  Diet,  exercise and weight loss can reverse and cure diabetes in the early stages.  Diet, exercise and weight loss is very important in the control and prevention of complications of diabetes which affects every system in your body, ie. Brain - dementia/stroke, eyes - glaucoma/blindness, heart - heart attack/heart failure, kidneys - dialysis, stomach - gastric paralysis, intestines - malabsorption, nerves - severe painful neuritis, circulation - gangrene & loss of a leg(s), and finally cancer and Alzheimers.    I recommend avoid fried & greasy foods,  sweets/candy, white rice (brown or wild rice or Quinoa is OK), white potatoes (sweet potatoes are OK) - anything made from white flour - bagels, doughnuts, rolls, buns, biscuits,white and wheat breads, pizza crust and traditional pasta made of white flour & egg white(vegetarian pasta or spinach or wheat pasta is OK).  Multi-grain bread is OK - like multi-grain flat bread or sandwich thins. Avoid alcohol in excess. Exercise is also important.    Eat all the vegetables you want - avoid meat, especially red meat and dairy - especially cheese.  Cheese is the most concentrated form of trans-fats which is the worst thing to clog up our arteries. Veggie cheese is OK which can be found in the fresh produce section at [employer] or [employer] or [employer]  ++++++++++++++++++++++++++++++++++++++++++++++++++ DASH Eating Plan  DASH stands for "Dietary Approaches to Stop Hypertension."   The DASH eating plan is a healthy eating plan that has been shown to reduce high blood   pressure (hypertension). Additional health benefits may include reducing the risk of type 2 diabetes mellitus, heart disease, and stroke. The DASH eating plan may also help with weight loss.  WHAT DO I NEED TO KNOW ABOUT THE DASH EATING PLAN?  For the DASH eating plan, you will follow these general guidelines:  Choose foods with a percent daily value for sodium of less than 5% (as listed on the food  label).  Use salt-free seasonings or herbs instead of table salt or sea salt.  Check with your health care provider or pharmacist before using salt substitutes.  Eat lower-sodium products, often labeled as "lower sodium" or "no salt added."  Eat fresh foods.  Eat more vegetables, fruits, and low-fat dairy products.    Choose whole grains. Look for the word "whole" as the first word in the ingredient list.  Choose fish   Limit sweets, desserts, sugars, and sugary drinks.  Choose heart-healthy fats.  Eat veggie cheese   Eat more home-cooked food and less restaurant, buffet, and fast food.  Limit fried foods.  Cook foods using methods other than frying.  Limit canned vegetables. If you do use them, rinse them well to decrease the sodium.  When eating at a restaurant, ask that your food be prepared with less salt, or no salt if possible.                      WHAT FOODS CAN I EAT?  Read Dr Joel Fuhrman's books on The End of Dieting & The End of Diabetes  Grains  Whole grain or whole wheat bread. Brown rice. Whole grain or whole wheat pasta. Quinoa, bulgur, and whole grain cereals. Low-sodium cereals. Corn or whole wheat flour tortillas. Whole grain cornbread. Whole grain crackers. Low-sodium crackers.  Vegetables  Fresh or frozen vegetables (raw, steamed, roasted, or grilled). Low-sodium or reduced-sodium tomato and vegetable juices. Low-sodium or reduced-sodium tomato sauce and paste. Low-sodium or reduced-sodium canned vegetables.   Fruits  All fresh, canned (in natural juice), or frozen fruits.  Protein Products   All fish and seafood.  Dried beans, peas, or lentils. Unsalted nuts and seeds. Unsalted canned beans.  Dairy  Low-fat dairy products, such as skim or 1% milk, 2% or reduced-fat cheeses, low-fat ricotta or cottage cheese, or plain low-fat yogurt. Low-sodium or reduced-sodium cheeses.  Fats and Oils  Tub margarines without trans fats. Light or  reduced-fat mayonnaise and salad dressings (reduced sodium). Avocado. Safflower, olive, or canola oils. Natural peanut or almond butter.  Other  Unsalted popcorn and pretzels. The items listed above may not be a complete list of recommended foods or beverages. Contact your dietitian for more options.  +++++++++++++++++++++++++++++++++++++++++++  WHAT FOODS ARE NOT RECOMMENDED?  Grains/ White flour or wheat flour  White bread. White pasta. White rice. Refined cornbread. Bagels and croissants. Crackers that contain trans fat.  Vegetables  Creamed or fried vegetables. Vegetables in a . Regular canned vegetables. Regular canned tomato sauce and paste. Regular tomato and vegetable juices.  Fruits  Dried fruits. Canned fruit in light or heavy syrup. Fruit juice.  Meat and Other Protein Products  Meat in general - RED mwaet & White meat.  Fatty cuts of meat. Ribs, chicken wings, bacon, sausage, bologna, salami, chitterlings, fatback, hot dogs, bratwurst, and packaged luncheon meats.  Dairy  Whole or 2% milk, cream, half-and-half, and cream cheese. Whole-fat or sweetened yogurt. Full-fat cheeses or blue cheese. Nondairy creamers and whipped toppings. Processed cheese, cheese spreads, or   cheese curds.  Condiments  Onion and garlic salt, seasoned salt, table salt, and sea salt. Canned and packaged gravies. Worcestershire sauce. Tartar sauce. Barbecue sauce. Teriyaki sauce. Soy sauce, including reduced sodium. Steak sauce. Fish sauce. Oyster sauce. Cocktail sauce. Horseradish. Ketchup and mustard. Meat flavorings and tenderizers. Bouillon cubes. Hot sauce. Tabasco sauce. Marinades. Taco seasonings. Relishes.  Fats and Oils Butter, stick margarine, lard, shortening and bacon fat. Coconut, palm kernel, or palm oils. Regular salad dressings.  Pickles and olives. Salted popcorn and pretzels.  The items listed above may not be a complete list of foods and beverages to avoid.   Preventive  Care for Adults  A healthy lifestyle and preventive care can promote health and wellness. Preventive health guidelines for men include the following key practices:  A routine yearly physical is a good way to check with your health care provider about your health and preventative screening. It is a chance to share any concerns and updates on your health and to receive a thorough exam.  Visit your dentist for a routine exam and preventative care every 6 months. Brush your teeth twice a day and floss once a day. Good oral hygiene prevents tooth decay and gum disease.  The frequency of eye exams is based on your age, health, family medical history, use of contact lenses, and other factors. Follow your health care provider's recommendations for frequency of eye exams.  Eat a healthy diet. Foods such as vegetables, fruits, whole grains, low-fat dairy products, and lean protein foods contain the nutrients you need without too many calories. Decrease your intake of foods high in solid fats, added sugars, and salt. Eat the right amount of calories for you.Get information about a proper diet from your health care provider, if necessary.  Regular physical exercise is one of the most important things you can do for your health. Most adults should get at least 150 minutes of moderate-intensity exercise (any activity that increases your heart rate and causes you to sweat) each week. In addition, most adults need muscle-strengthening exercises on 2 or more days a week.  Maintain a healthy weight. The body mass index (BMI) is a screening tool to identify possible weight problems. It provides an estimate of body fat based on height and weight. Your health care provider can find your BMI and can help you achieve or maintain a healthy weight.For adults 20 years and older:  A BMI below 18.5 is considered underweight.  A BMI of 18.5 to 24.9 is normal.  A BMI of 25 to 29.9 is considered overweight.  A BMI of 30  and above is considered obese.  Maintain normal blood lipids and cholesterol levels by exercising and minimizing your intake of saturated fat. Eat a balanced diet with plenty of fruit and vegetables. Blood tests for lipids and cholesterol should begin at age 20 and be repeated every 5 years. If your lipid or cholesterol levels are high, you are over 50, or you are at high risk for heart disease, you may need your cholesterol levels checked more frequently.Ongoing high lipid and cholesterol levels should be treated with medicines if diet and exercise are not working.  If you smoke, find out from your health care provider how to quit. If you do not use tobacco, do not start.  Lung cancer screening is recommended for adults aged 55-80 years who are at high risk for developing lung cancer because of a history of smoking. A yearly low-dose CT scan of the lungs is   recommended for people who have at least a 30-pack-year history of smoking and are a current smoker or have quit within the past 15 years. A pack year of smoking is smoking an average of 1 pack of cigarettes a day for 1 year (for example: 1 pack a day for 30 years or 2 packs a day for 15 years). Yearly screening should continue until the smoker has stopped smoking for at least 15 years. Yearly screening should be stopped for people who develop a health problem that would prevent them from having lung cancer treatment.  If you choose to drink alcohol, do not have more than 2 drinks per day. One drink is considered to be 12 ounces (355 mL) of beer, 5 ounces (148 mL) of wine, or 1.5 ounces (44 mL) of liquor.  Avoid use of street drugs. Do not share needles with anyone. Ask for help if you need support or instructions about stopping the use of drugs.  High blood pressure causes heart disease and increases the risk of stroke. Your blood pressure should be checked at least every 1-2 years. Ongoing high blood pressure should be treated with medicines, if  weight loss and exercise are not effective.  If you are 45-79 years old, ask your health care provider if you should take aspirin to prevent heart disease.  Diabetes screening involves taking a blood sample to check your fasting blood sugar level. Testing should be considered at a younger age or be carried out more frequently if you are overweight and have at least 1 risk factor for diabetes.  Colorectal cancer can be detected and often prevented. Most routine colorectal cancer screening begins at the age of 50 and continues through age 75. However, your health care provider may recommend screening at an earlier age if you have risk factors for colon cancer. On a yearly basis, your health care provider may provide home test kits to check for hidden blood in the stool. Use of a small camera at the end of a tube to directly examine the colon (sigmoidoscopy or colonoscopy) can detect the earliest forms of colorectal cancer. Talk to your health care provider about this at age 50, when routine screening begins. Direct exam of the colon should be repeated every 5-10 years through age 75, unless early forms of precancerous polyps or small growths are found.  Hepatitis C blood testing is recommended for all people born from 1945 through 1965 and any individual with known risks for hepatitis C.  Screening for abdominal aortic aneurysm (AAA)  by ultrasound is recommended for people who have history of high blood pressure or who are current or former smokers.  Healthy men should  receive prostate-specific antigen (PSA) blood tests as part of routine cancer screening. Talk with your health care provider about prostate cancer screening.  Testicular cancer screening is  recommended for adult males. Screening includes self-exam, a health care provider exam, and other screening tests. Consult with your health care provider about any symptoms you have or any concerns you have about testicular cancer.  Use sunscreen.  Apply sunscreen liberally and repeatedly throughout the day. You should seek shade when your shadow is shorter than you. Protect yourself by wearing long sleeves, pants, a wide-brimmed hat, and sunglasses year round, whenever you are outdoors.  Once a month, do a whole-body skin exam, using a mirror to look at the skin on your back. Tell your health care provider about new moles, moles that have irregular borders, moles that are   larger than a pencil eraser, or moles that have changed in shape or color.  Stay current with required vaccines (immunizations).  Influenza vaccine. All adults should be immunized every year.  Tetanus, diphtheria, and acellular pertussis (Td, Tdap) vaccine. An adult who has not previously received Tdap or who does not know his vaccine status should receive 1 dose of Tdap. This initial dose should be followed by tetanus and diphtheria toxoids (Td) booster doses every 10 years. Adults with an unknown or incomplete history of completing a 3-dose immunization series with Td-containing vaccines should begin or complete a primary immunization series including a Tdap dose. Adults should receive a Td booster every 10 years.  Zoster vaccine. One dose is recommended for adults aged 60 years or older unless certain conditions are present.    PREVNAR - Pneumococcal 13-valent conjugate (PCV13) vaccine. When indicated, a person who is uncertain of his immunization history and has no record of immunization should receive the PCV13 vaccine. An adult aged 19 years or older who has certain medical conditions and has not been previously immunized should receive 1 dose of PCV13 vaccine. This PCV13 should be followed with a dose of pneumococcal polysaccharide (PPSV23) vaccine. The PPSV23 vaccine dose should be obtained at least 8 weeks after the dose of PCV13 vaccine. An adult aged 19 years or older who has certain medical conditions and previously received 1 or more doses of PPSV23 vaccine should  receive 1 dose of PCV13. The PCV13 vaccine dose should be obtained 1 or more years after the last PPSV23 vaccine dose.    PNEUMOVAX - Pneumococcal polysaccharide (PPSV23) vaccine. When PCV13 is also indicated, PCV13 should be obtained first. All adults aged 65 years and older should be immunized. An adult younger than age 65 years who has certain medical conditions should be immunized. Any person who resides in a nursing home or long-term care facility should be immunized. An adult smoker should be immunized. People with an immunocompromised condition and certain other conditions should receive both PCV13 and PPSV23 vaccines. People with human immunodeficiency virus (HIV) infection should be immunized as soon as possible after diagnosis. Immunization during chemotherapy or radiation therapy should be avoided. Routine use of PPSV23 vaccine is not recommended for American Indians, [place] Natives, or people younger than 65 years unless there are medical conditions that require PPSV23 vaccine. When indicated, people who have unknown immunization and have no record of immunization should receive PPSV23 vaccine. One-time revaccination 5 years after the first dose of PPSV23 is recommended for people aged 19-64 years who have chronic kidney failure, nephrotic syndrome, asplenia, or immunocompromised conditions. People who received 1-2 doses of PPSV23 before age 65 years should receive another dose of PPSV23 vaccine at age 65 years or later if at least 5 years have passed since the previous dose. Doses of PPSV23 are not needed for people immunized with PPSV23 at or after age 65 years.    Hepatitis A vaccine. Adults who wish to be protected from this disease, have certain high-risk conditions, work with hepatitis A-infected animals, work in hepatitis A research labs, or travel to or work in countries with a high rate of hepatitis A should be immunized. Adults who were previously unvaccinated and who anticipate close  contact with an international adoptee during the first 60 days after arrival in the [place] from a country with a high rate of hepatitis A should be immunized.    Hepatitis B vaccine. Adults should be immunized if they wish to be   protected from this disease, have certain high-risk conditions, may be exposed to blood or other infectious body fluids, are household contacts or sex partners of hepatitis B positive people, are clients or workers in certain care facilities, or travel to or work in countries with a high rate of hepatitis B.   Preventive Service / Frequency   Ages 65 and over  Blood pressure check.  Lipid and cholesterol check.  Lung cancer screening. / Every year if you are aged 55-80 years and have a 30-pack-year history of smoking and currently smoke or have quit within the past 15 years. Yearly screening is stopped once you have quit smoking for at least 15 years or develop a health problem that would prevent you from having lung cancer treatment.  Fecal occult blood test (FOBT) of stool. You may not have to do this test if you get a colonoscopy every 10 years.  Flexible sigmoidoscopy** or colonoscopy.** / Every 5 years for a flexible sigmoidoscopy or every 10 years for a colonoscopy beginning at age 50 and continuing until age 75.  Hepatitis C blood test.** / For all people born from 1945 through 1965 and any individual with known risks for hepatitis C.  Abdominal aortic aneurysm (AAA) screening./ Screening current or former smokers or have Hypertension.  Skin self-exam. / Monthly.  Influenza vaccine. / Every year.  Tetanus, diphtheria, and acellular pertussis (Tdap/Td) vaccine.** / 1 dose of Td every 10 years.   Zoster vaccine.** / 1 dose for adults aged 60 years or older.         Pneumococcal 13-valent conjugate (PCV13) vaccine.    Pneumococcal polysaccharide (PPSV23) vaccine.     Hepatitis A vaccine.** / Consult your health care provider.  Hepatitis  B vaccine.** / Consult your health care provider. Screening for abdominal aortic aneurysm (AAA)  by ultrasound is recommended for people who have history of high blood pressure or who are current or former smokers.  

## 2016-03-02 LAB — URINALYSIS, ROUTINE W REFLEX MICROSCOPIC
BILIRUBIN URINE: NEGATIVE
Glucose, UA: NEGATIVE
Hgb urine dipstick: NEGATIVE
KETONES UR: NEGATIVE
Leukocytes, UA: NEGATIVE
Nitrite: NEGATIVE
Protein, ur: NEGATIVE
SPECIFIC GRAVITY, URINE: 1.017 (ref 1.001–1.035)
pH: 6 (ref 5.0–8.0)

## 2016-03-02 LAB — MICROALBUMIN / CREATININE URINE RATIO
CREATININE, URINE: 113 mg/dL (ref 20–370)
MICROALB/CREAT RATIO: 23 ug/mg{creat} (ref <30)
Microalb, Ur: 2.6 mg/dL

## 2016-03-02 LAB — PSA: PSA: 0.29 ng/mL (ref <=4.00)

## 2016-03-02 LAB — INSULIN, RANDOM: Insulin: 6.6 u[IU]/mL (ref 2.0–19.6)

## 2016-03-02 LAB — VITAMIN D 25 HYDROXY (VIT D DEFICIENCY, FRACTURES): Vit D, 25-Hydroxy: 83 ng/mL (ref 30–100)

## 2016-03-10 ENCOUNTER — Other Ambulatory Visit: Payer: Self-pay | Admitting: *Deleted

## 2016-03-10 DIAGNOSIS — Z0001 Encounter for general adult medical examination with abnormal findings: Secondary | ICD-10-CM

## 2016-03-10 DIAGNOSIS — Z1212 Encounter for screening for malignant neoplasm of rectum: Secondary | ICD-10-CM

## 2016-03-10 LAB — POC HEMOCCULT BLD/STL (HOME/3-CARD/SCREEN)
Card #3 Fecal Occult Blood, POC: NEGATIVE
FECAL OCCULT BLD: NEGATIVE
Fecal Occult Blood, POC: NEGATIVE

## 2016-03-14 ENCOUNTER — Other Ambulatory Visit: Payer: Self-pay | Admitting: Internal Medicine

## 2016-06-10 ENCOUNTER — Encounter: Payer: Self-pay | Admitting: Internal Medicine

## 2016-06-10 ENCOUNTER — Ambulatory Visit (INDEPENDENT_AMBULATORY_CARE_PROVIDER_SITE_OTHER): Payer: Medicare Other | Admitting: Internal Medicine

## 2016-06-10 VITALS — BP 134/62 | HR 56 | Temp 98.0°F | Resp 16 | Ht 71.0 in | Wt 190.0 lb

## 2016-06-10 DIAGNOSIS — E559 Vitamin D deficiency, unspecified: Secondary | ICD-10-CM | POA: Diagnosis not present

## 2016-06-10 DIAGNOSIS — I10 Essential (primary) hypertension: Secondary | ICD-10-CM

## 2016-06-10 DIAGNOSIS — E782 Mixed hyperlipidemia: Secondary | ICD-10-CM | POA: Diagnosis not present

## 2016-06-10 DIAGNOSIS — E1122 Type 2 diabetes mellitus with diabetic chronic kidney disease: Secondary | ICD-10-CM | POA: Diagnosis not present

## 2016-06-10 DIAGNOSIS — I480 Paroxysmal atrial fibrillation: Secondary | ICD-10-CM | POA: Diagnosis not present

## 2016-06-10 DIAGNOSIS — G587 Mononeuritis multiplex: Secondary | ICD-10-CM

## 2016-06-10 DIAGNOSIS — E349 Endocrine disorder, unspecified: Secondary | ICD-10-CM

## 2016-06-10 DIAGNOSIS — N182 Chronic kidney disease, stage 2 (mild): Secondary | ICD-10-CM | POA: Diagnosis not present

## 2016-06-10 DIAGNOSIS — Z79899 Other long term (current) drug therapy: Secondary | ICD-10-CM | POA: Diagnosis not present

## 2016-06-10 DIAGNOSIS — Z23 Encounter for immunization: Secondary | ICD-10-CM | POA: Diagnosis not present

## 2016-06-10 DIAGNOSIS — E1141 Type 2 diabetes mellitus with diabetic mononeuropathy: Secondary | ICD-10-CM

## 2016-06-10 DIAGNOSIS — E291 Testicular hypofunction: Secondary | ICD-10-CM

## 2016-06-10 LAB — CBC WITH DIFFERENTIAL/PLATELET
BASOS PCT: 1 %
Basophils Absolute: 49 cells/uL (ref 0–200)
Eosinophils Absolute: 196 cells/uL (ref 15–500)
Eosinophils Relative: 4 %
HEMATOCRIT: 40.6 % (ref 38.5–50.0)
HEMOGLOBIN: 13.8 g/dL (ref 13.2–17.1)
LYMPHS ABS: 2058 {cells}/uL (ref 850–3900)
Lymphocytes Relative: 42 %
MCH: 29.3 pg (ref 27.0–33.0)
MCHC: 34 g/dL (ref 32.0–36.0)
MCV: 86.2 fL (ref 80.0–100.0)
MONO ABS: 539 {cells}/uL (ref 200–950)
MPV: 10 fL (ref 7.5–12.5)
Monocytes Relative: 11 %
NEUTROS PCT: 42 %
Neutro Abs: 2058 cells/uL (ref 1500–7800)
Platelets: 188 10*3/uL (ref 140–400)
RBC: 4.71 MIL/uL (ref 4.20–5.80)
RDW: 14.2 % (ref 11.0–15.0)
WBC: 4.9 10*3/uL (ref 3.8–10.8)

## 2016-06-10 LAB — TSH: TSH: 0.96 mIU/L (ref 0.40–4.50)

## 2016-06-10 MED ORDER — FINASTERIDE 5 MG PO TABS: ORAL_TABLET | ORAL | Status: DC

## 2016-06-10 MED ORDER — QUINAPRIL HCL 40 MG PO TABS: ORAL_TABLET | ORAL | Status: DC

## 2016-06-10 MED ORDER — ATENOLOL 100 MG PO TABS: ORAL_TABLET | ORAL | Status: DC

## 2016-06-10 MED ORDER — GLIPIZIDE 5 MG PO TABS: ORAL_TABLET | ORAL | Status: DC

## 2016-06-10 MED ORDER — METFORMIN HCL ER 500 MG PO TB24: ORAL_TABLET | ORAL | Status: DC

## 2016-06-10 MED ORDER — LEVOTHYROXINE SODIUM 50 MCG PO TABS: ORAL_TABLET | ORAL | Status: DC

## 2016-06-10 MED ORDER — SIMVASTATIN 40 MG PO TABS: ORAL_TABLET | ORAL | Status: DC

## 2016-06-10 NOTE — Progress Notes (Signed)
Assessment and Plan:  Hypertension:  -Continue medication -monitor blood pressure at home. -Continue DASH diet -Reminder to go to the ER if any CP, SOB, nausea, dizziness, severe HA, changes vision/speech, left arm numbness and tingling and jaw pain.  Cholesterol -cont statin -at goal - Continue diet and exercise -Check cholesterol.   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  -Check A1C  Vitamin D Def -continue medications.   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 Further disposition pending results of labs. Discussed med's effects and SE's.    HPI 75 y.o. male  presents for 3 month follow up with hypertension, hyperlipidemia, diabetes and vitamin D deficiency.   His blood pressure has been controlled at home, today their BP is BP: 134/62 mmHg.He does not workout. He denies chest pain, shortness of breath, dizziness.  He has not had any episodes of Atrial fibrillation that he is aware of.     He is on cholesterol medication and denies myalgias. His cholesterol is at goal. The cholesterol was:  03/01/2016: Cholesterol 107*; HDL 53; LDL Cholesterol 44; Triglycerides 50   He has been working on diet and exercise for diabetes with diabetic chronic kidney disease, he is on bASA, he is on ACE/ARB, and denies  foot ulcerations, hyperglycemia, hypoglycemia , increased appetite, nausea, paresthesia of the feet, polydipsia, polyuria, visual disturbances, vomiting and weight loss. Last A1C was: 03/01/2016: Hgb A1c MFr Bld 6.2*   Patient is on Vitamin D supplement. 03/01/2016: Vit D, 25-Hydroxy 83  He reports that he has been having some dry shriveling with his fingers.  He reports that it does not bother him.    He reports that he did get his Prevnar shot.     He has been taking a 1/2 tablet of his thyroid medication daily.  He reports that he takes that on an empty stomach.    He is due to have his eyes checked in October with Dr. Delman Cheadle.    He is still going to the dentist.  He sees Dr. Antony Salmon next Friday.    He is still following with Dr. Alen Blew for his MGUS.  He follows with him once yearly.     Current Medications:  Current Outpatient Prescriptions on File Prior to Visit  Medication Sig Dispense Refill  . aspirin 81 MG tablet Take 81 mg by mouth daily.    [name] atenolol (TENORMIN) 100 MG tablet Take 1 tablet by mouth  daily 90 tablet 1  . Cholecalciferol (VITAMIN D PO) Take 5,000 Units by mouth daily.    . finasteride (PROSCAR) 5 MG tablet TAKE 1 TABLET BY MOUTH  EVERY DAY FOR PROSTATE 90 tablet 1  . glipiZIDE (GLUCOTROL) 5 MG tablet Take 1 tablet by mouth 3  times daily before meals  for diabetes 270 tablet 1  . levothyroxine (SYNTHROID, LEVOTHROID) 50 MCG tablet Take 1 tablet by mouth  daily before breakfast 90 tablet 1  . Magnesium 250 MG TABS Take 250 mg by mouth daily.    . metFORMIN (GLUCOPHAGE-XR) 500 MG 24 hr tablet TAKE 1 TABLET BY MOUTH 4  TIMES DAILY FOR DIABETES 360 tablet 1  . Multiple Vitamin (MULTIVITAMIN) capsule Take 1 capsule by mouth daily.      . potassium chloride (KLOR-CON) 10 MEQ CR tablet Take 10 mEq  by mouth daily.      . quinapril (ACCUPRIL) 40 MG tablet Take 1 tablet by mouth  every day for blood  pressure 90 tablet 1  . simvastatin (ZOCOR) 40 MG tablet TAKE 1 TABLET BY MOUTH AT  BEDTIME OR AS DIRECTED FOR  CHOLESTEROL 90 tablet 1  . vitamin C (ASCORBIC ACID) 500 MG tablet Take 500 mg by mouth daily.       No current facility-administered medications on file prior to visit.   Medical History:  Past Medical History  Diagnosis Date  . Diabetes mellitus ([place])   . Hypertension   . Hyperlipidemia   . Atrial fibrillation ([place])   . Type II or unspecified type diabetes mellitus without mention of complication, not  stated as uncontrolled   . BPH (benign prostatic hyperplasia)   . Other testicular hypofunction    Allergies:  Allergies  Allergen Reactions  . Penicillins Other (See Comments)    Patient stated,"I was told not to take Penicillin anymore. I don't remember what kind of reaction I had."     Review of Systems:  Review of Systems  Constitutional: Negative for fever, chills and malaise/fatigue.  HENT: Negative for congestion, ear pain and sore throat.   Eyes: Negative.   Respiratory: Negative for cough, shortness of breath and wheezing.   Cardiovascular: Negative for chest pain, palpitations and leg swelling.  Gastrointestinal: Negative for heartburn, abdominal pain, diarrhea, constipation, blood in stool and melena.  Genitourinary: Negative.   Skin: Negative.   Neurological: Negative for dizziness, sensory change, loss of consciousness and headaches.  Psychiatric/Behavioral: Negative for depression. The patient is not nervous/anxious and does not have insomnia.     Family history- Review and unchanged  Social history- Review and unchanged  Physical Exam: BP 134/62 mmHg  Pulse 56  Temp(Src) 98 F (36.7 C) (Temporal)  Resp 16  Ht 5\' 11"  (1.803 m)  Wt 190 lb (86.183 kg)  BMI 26.51 kg/m2 Wt Readings from Last 3 Encounters:  06/10/16 190 lb (86.183 kg)  03/01/16 191 lb 3.2 oz (86.728 kg)  11/30/15 188 lb (85.276 kg)   General Appearance: Well nourished well developed, non-toxic appearing, in no apparent distress. Eyes: PERRLA, EOMs, conjunctiva no swelling or erythema ENT/Mouth: Ear canals clear with no erythema, swelling, or discharge.  TMs normal bilaterally, oropharynx clear, moist, with no exudate.   Neck: Supple, thyroid normal, no JVD, no cervical adenopathy.  Respiratory: Respiratory effort normal, breath sounds clear A&P, no wheeze, rhonchi or rales noted.  No retractions, no accessory muscle usage Cardio: RRR with no MRGs. No noted edema.  Abdomen: Soft, + BS.  Non  tender, no guarding, rebound, hernias, masses. Musculoskeletal: Full ROM, 5/5 strength, Normal gait Skin: Warm, dry without rashes, lesions, ecchymosis.  Neuro: Awake and oriented X 3, Cranial nerves intact. No cerebellar symptoms.  Psych: normal affect, Insight and Judgment appropriate.    Starlyn Skeans, PA-C 9:14 AM [facility]

## 2016-06-11 LAB — HEMOGLOBIN A1C
Hgb A1c MFr Bld: 6.2 % — ABNORMAL HIGH (ref <5.7)
Mean Plasma Glucose: 131 mg/dL

## 2016-06-11 LAB — HEPATIC FUNCTION PANEL
ALK PHOS: 59 U/L (ref 40–115)
ALT: 12 U/L (ref 9–46)
AST: 17 U/L (ref 10–35)
Albumin: 3.9 g/dL (ref 3.6–5.1)
BILIRUBIN INDIRECT: 0.5 mg/dL (ref 0.2–1.2)
Bilirubin, Direct: 0.2 mg/dL (ref <=0.2)
TOTAL PROTEIN: 6.1 g/dL (ref 6.1–8.1)
Total Bilirubin: 0.7 mg/dL (ref 0.2–1.2)

## 2016-06-11 LAB — LIPID PANEL
Cholesterol: 114 mg/dL — ABNORMAL LOW (ref 125–200)
HDL: 52 mg/dL (ref >=40)
LDL CALC: 51 mg/dL (ref <130)
Total CHOL/HDL Ratio: 2.2 Ratio (ref <=5.0)
Triglycerides: 57 mg/dL (ref <150)
VLDL: 11 mg/dL (ref <30)

## 2016-06-11 LAB — BASIC METABOLIC PANEL WITH GFR
BUN: 19 mg/dL (ref 7–25)
CO2: 25 mmol/L (ref 20–31)
Calcium: 9 mg/dL (ref 8.6–10.3)
Chloride: 104 mmol/L (ref 98–110)
Creat: 1.02 mg/dL (ref 0.70–1.18)
GFR, EST NON AFRICAN AMERICAN: 72 mL/min (ref >=60)
GFR, Est African American: 83 mL/min (ref >=60)
GLUCOSE: 108 mg/dL — AB (ref 65–99)
POTASSIUM: 4.6 mmol/L (ref 3.5–5.3)
Sodium: 141 mmol/L (ref 135–146)

## 2016-08-03 ENCOUNTER — Other Ambulatory Visit: Payer: Self-pay | Admitting: Internal Medicine

## 2016-08-22 ENCOUNTER — Encounter: Payer: Self-pay | Admitting: Internal Medicine

## 2016-08-22 ENCOUNTER — Ambulatory Visit (INDEPENDENT_AMBULATORY_CARE_PROVIDER_SITE_OTHER): Payer: Medicare Other | Admitting: Internal Medicine

## 2016-08-22 VITALS — BP 126/64 | HR 64 | Temp 97.7°F | Resp 16 | Ht 71.0 in | Wt 191.8 lb

## 2016-08-22 DIAGNOSIS — T63481A Toxic effect of venom of other arthropod, accidental (unintentional), initial encounter: Secondary | ICD-10-CM | POA: Diagnosis not present

## 2016-08-22 NOTE — Progress Notes (Signed)
Subjective:    Patient ID: Earl [name], male    DOB: October 05, 1941, 75 y.o.   MRN: [id number]  HPI  This very nice 75 yo MBM with HTN, HLD, T2_DM, & MGUS presents with hx/o #4 "bee" stings 3 days ago to his L forearm and arm, R cheek and  R abdomen. He did take Benadryl for 2 nights. Denies any dizziness, chest discomfort or dyspnea. No rash or migratory hives.   Outpatient Medications Prior to Visit  Medication Sig Dispense Refill  . aspirin 81 MG tablet Take 81 mg by mouth daily.    [name] atenolol (TENORMIN) 100 MG tablet Take 1 tablet by mouth  daily 90 tablet 1  . Cholecalciferol (VITAMIN D PO) Take 5,000 Units by mouth daily.    . finasteride (PROSCAR) 5 MG tablet TAKE 1 TABLET BY MOUTH  EVERY DAY FOR PROSTATE 90 tablet 1  . gabapentin (NEURONTIN) 300 MG capsule TAKE 1 TO 2 CAPSULES BY  MOUTH AT NIGHT FOR PAIN 180 capsule 1  . glipiZIDE (GLUCOTROL) 5 MG tablet Take 1 tablet by mouth 3  times daily before meals  for diabetes 270 tablet 1  . levothyroxine (SYNTHROID, LEVOTHROID) 50 MCG tablet Take 1 tablet by mouth  daily before breakfast 90 tablet 1  . Magnesium 250 MG TABS Take 250 mg by mouth daily.    . metFORMIN (GLUCOPHAGE-XR) 500 MG 24 hr tablet TAKE 1 TABLET BY MOUTH 4  TIMES DAILY FOR DIABETES 360 tablet 1  . Multiple Vitamin (MULTIVITAMIN) capsule Take 1 capsule by mouth daily.      . potassium chloride (KLOR-CON) 10 MEQ CR tablet Take 10 mEq by mouth daily.      . quinapril (ACCUPRIL) 40 MG tablet Take 1 tablet by mouth  every day for blood  pressure 90 tablet 1  . simvastatin (ZOCOR) 40 MG tablet TAKE 1 TABLET BY MOUTH AT  BEDTIME OR AS DIRECTED FOR  CHOLESTEROL 90 tablet 1  . vitamin C (ASCORBIC ACID) 500 MG tablet Take 500 mg by mouth daily.       No facility-administered medications prior to visit.    Allergies  Allergen Reactions  . Penicillins Other (See Comments)    Patient stated,"I was told not to take Penicillin anymore. I don't remember what kind of reaction I  had."   Past Medical History:  Diagnosis Date  . Atrial fibrillation ([place])   . BPH (benign prostatic hyperplasia)   . Diabetes mellitus ([place])   . Hyperlipidemia   . Hypertension   . Other testicular hypofunction   . Type II or unspecified type diabetes mellitus without mention of complication, not stated as uncontrolled    Past Surgical History:  Procedure Laterality Date  . [place] and 2004   Review of Systems   10 point systems review negative except as above.    Objective:   Physical Exam  BP 126/64   Pulse 64   Temp 97.7 F (36.5 C)   Resp 16   Ht 5\' 11"  (1.803 m)   Wt 191 lb 12.8 oz (87 kg)   BMI 26.75 kg/m   HEENT - Eac's patent. TM's Nl. EOM's full. PERRLA. NasoOroPharynx clear. Neck - supple. Nl Thyroid. Carotids 2+ & No bruits, nodes, JVD Chest - Clear equal BS w/o Rales, rhonchi, wheezes. Cor - Nl HS. RRR w/o sig MGR. PP 1(+). No edema. Abd - No palpable organomegaly, masses or tenderness. BS nl. MS- FROM w/o deformities.  Muscle power, tone and bulk Nl. Gait Nl. Neuro - No obvious Cr N abnormalities. Sensory, motor and Cerebellar functions appear Nl w/o focal abnormalities.    Assessment & Plan:   1. Insect stings, accidental or unintentional, initial encounter  - suspect the stings were likely "yellow jackets"   - Advised could try Loratadine/Certirizine in Am & Diphenhydramine in the evening.  - Discussed sx's of anaphylaxis.

## 2016-09-01 DIAGNOSIS — E119 Type 2 diabetes mellitus without complications: Secondary | ICD-10-CM | POA: Diagnosis not present

## 2016-09-13 ENCOUNTER — Other Ambulatory Visit: Payer: Self-pay | Admitting: Internal Medicine

## 2016-09-13 ENCOUNTER — Ambulatory Visit (INDEPENDENT_AMBULATORY_CARE_PROVIDER_SITE_OTHER): Payer: Medicare Other | Admitting: Physician Assistant

## 2016-09-13 VITALS — BP 128/68 | HR 60 | Temp 97.0°F | Resp 16 | Ht 71.0 in | Wt 191.0 lb

## 2016-09-13 DIAGNOSIS — E782 Mixed hyperlipidemia: Secondary | ICD-10-CM | POA: Diagnosis not present

## 2016-09-13 DIAGNOSIS — E559 Vitamin D deficiency, unspecified: Secondary | ICD-10-CM

## 2016-09-13 DIAGNOSIS — E1122 Type 2 diabetes mellitus with diabetic chronic kidney disease: Secondary | ICD-10-CM

## 2016-09-13 DIAGNOSIS — I10 Essential (primary) hypertension: Secondary | ICD-10-CM

## 2016-09-13 DIAGNOSIS — I480 Paroxysmal atrial fibrillation: Secondary | ICD-10-CM | POA: Diagnosis not present

## 2016-09-13 DIAGNOSIS — Z79899 Other long term (current) drug therapy: Secondary | ICD-10-CM | POA: Diagnosis not present

## 2016-09-13 DIAGNOSIS — E1141 Type 2 diabetes mellitus with diabetic mononeuropathy: Secondary | ICD-10-CM | POA: Diagnosis not present

## 2016-09-13 DIAGNOSIS — E039 Hypothyroidism, unspecified: Secondary | ICD-10-CM

## 2016-09-13 DIAGNOSIS — Z23 Encounter for immunization: Secondary | ICD-10-CM | POA: Diagnosis not present

## 2016-09-13 DIAGNOSIS — G587 Mononeuritis multiplex: Secondary | ICD-10-CM | POA: Diagnosis not present

## 2016-09-13 DIAGNOSIS — N182 Chronic kidney disease, stage 2 (mild): Secondary | ICD-10-CM

## 2016-09-13 LAB — CBC WITH DIFFERENTIAL/PLATELET
BASOS ABS: 0 {cells}/uL (ref 0–200)
Basophils Relative: 0 %
Eosinophils Absolute: 88 cells/uL (ref 15–500)
Eosinophils Relative: 2 %
HEMATOCRIT: 40.4 % (ref 38.5–50.0)
HEMOGLOBIN: 13.8 g/dL (ref 13.2–17.1)
LYMPHS ABS: 2156 {cells}/uL (ref 850–3900)
LYMPHS PCT: 49 %
MCH: 29.1 pg (ref 27.0–33.0)
MCHC: 34.2 g/dL (ref 32.0–36.0)
MCV: 85.1 fL (ref 80.0–100.0)
MONO ABS: 528 {cells}/uL (ref 200–950)
MPV: 9.9 fL (ref 7.5–12.5)
Monocytes Relative: 12 %
NEUTROS PCT: 37 %
Neutro Abs: 1628 cells/uL (ref 1500–7800)
Platelets: 180 10*3/uL (ref 140–400)
RBC: 4.75 MIL/uL (ref 4.20–5.80)
RDW: 13 % (ref 11.0–15.0)
WBC: 4.4 10*3/uL (ref 3.8–10.8)

## 2016-09-13 LAB — BASIC METABOLIC PANEL WITH GFR
BUN: 17 mg/dL (ref 7–25)
CALCIUM: 9.2 mg/dL (ref 8.6–10.3)
CO2: 27 mmol/L (ref 20–31)
CREATININE: 1.19 mg/dL — AB (ref 0.70–1.18)
Chloride: 105 mmol/L (ref 98–110)
GFR, EST AFRICAN AMERICAN: 69 mL/min (ref >=60)
GFR, Est Non African American: 59 mL/min — ABNORMAL LOW (ref >=60)
Glucose, Bld: 183 mg/dL — ABNORMAL HIGH (ref 65–99)
Potassium: 4.3 mmol/L (ref 3.5–5.3)
Sodium: 140 mmol/L (ref 135–146)

## 2016-09-13 LAB — HEPATIC FUNCTION PANEL
ALBUMIN: 3.8 g/dL (ref 3.6–5.1)
ALT: 15 U/L (ref 9–46)
AST: 18 U/L (ref 10–35)
Alkaline Phosphatase: 55 U/L (ref 40–115)
Bilirubin, Direct: 0.1 mg/dL (ref <=0.2)
Indirect Bilirubin: 0.5 mg/dL (ref 0.2–1.2)
TOTAL PROTEIN: 5.9 g/dL — AB (ref 6.1–8.1)
Total Bilirubin: 0.6 mg/dL (ref 0.2–1.2)

## 2016-09-13 LAB — LIPID PANEL
CHOLESTEROL: 132 mg/dL (ref 125–200)
HDL: 49 mg/dL (ref >=40)
LDL Cholesterol: 67 mg/dL (ref <130)
TRIGLYCERIDES: 82 mg/dL (ref <150)
Total CHOL/HDL Ratio: 2.7 Ratio (ref <=5.0)
VLDL: 16 mg/dL (ref <30)

## 2016-09-13 LAB — TSH: TSH: 1.12 m[IU]/L (ref 0.40–4.50)

## 2016-09-13 LAB — MAGNESIUM: MAGNESIUM: 1.8 mg/dL (ref 1.5–2.5)

## 2016-09-13 NOTE — Progress Notes (Addendum)
6 MONTH FOLLOW UP Assessment:   1. Essential hypertension - continue medications, DASH diet, exercise and monitor at home. Call if greater than 130/80.  - CBC with Differential/Platelet - BASIC METABOLIC PANEL WITH GFR - Hepatic function panel - TSH  2. Type 2 diabetes mellitus with stage 2 chronic kidney disease, without long-term current use of insulin ([place]) Discussed general issues about diabetes pathophysiology and management., Educational material distributed., Suggested low cholesterol diet., Encouraged aerobic exercise., Discussed foot care., Reminded to get yearly retinal exam. - Hemoglobin A1c  3. Diabetic mononeuritis multiplex, right abdomen Discussed general issues about diabetes pathophysiology and management., Educational material distributed., Suggested low cholesterol diet., Encouraged aerobic exercise., Discussed foot care., Reminded to get yearly retinal exam. - Hemoglobin A1c  4. Hyperlipidemia -continue medications, check lipids, decrease fatty foods, increase activity.  - Lipid panel  5. Vitamin D deficiency - VITAMIN D 25 Hydroxy (Vit-D Deficiency, Fractures)  6. Medication management - Magnesium  7. Paroxysmal atrial fibrillation (HCC) NSR at this time, continue ASA  8. Hypothyroidism, unspecified type Hypothyroidism-check TSH level, continue medications the same, reminded to take on an empty stomach 30-44mins before food.   Over 30 minutes of exam, counseling, chart review, and critical decision making was performed  Future Appointments Date Time Provider [place]  11/01/2016 8:00 AM [facility] ONLY [facility] None  11/08/2016 8:30 AM Wyatt Portela, MD [facility] None  04/14/2017 10:00 AM [name], MD [facility] None    Subjective:  Earl [name] is a 75 y.o. male who presents for 26month follow up for HTN, hyperlipidemia, diabetes, and vitamin D Def.   Has had some stress with wife having + PAP smear after PMB, getting CT  today.   His blood pressure has been controlled at home, today their BP is BP: 128/68 He does not workout. He denies chest pain, shortness of breath, dizziness.  He has history of PAF, currently medically rate controlled, NSR, not on anticoagulation and understands risk, on ASA.  He is on cholesterol medication and denies myalgias. His cholesterol is at goal. 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 Last A1C was:  Lab Results  Component Value Date   HGBA1C 6.2 (H) 06/10/2016   Lab Results  Component Value Date   GFRAA 83 06/10/2016   Patient is on Vitamin D supplement.   Lab Results  Component Value Date   VD25OH 53 03/01/2016     He is on thyroid medication. His medication was not changed last visit.   Lab Results  Component Value Date   TSH 0.96 06/10/2016  .   Medication Review: Current Outpatient Prescriptions on File Prior to Visit  Medication Sig Dispense Refill  . aspirin 81 MG tablet Take 81 mg by mouth daily.    [name] atenolol (TENORMIN) 100 MG tablet Take 1 tablet by mouth  daily 90 tablet 1  . Cholecalciferol (VITAMIN D PO) Take 5,000 Units by mouth daily.    . finasteride (PROSCAR) 5 MG tablet TAKE 1 TABLET BY MOUTH  EVERY DAY FOR PROSTATE 90 tablet 1  .  gabapentin (NEURONTIN) 300 MG capsule TAKE 1 TO 2 CAPSULES BY  MOUTH AT NIGHT FOR PAIN 180 capsule 1  . glipiZIDE (GLUCOTROL) 5 MG tablet Take 1 tablet by mouth 3  times daily before meals  for diabetes 270 tablet 1  . levothyroxine (SYNTHROID, LEVOTHROID) 50 MCG tablet Take 1 tablet by mouth  daily before breakfast  90 tablet 1  . Magnesium 250 MG TABS Take 250 mg by mouth daily.    . metFORMIN (GLUCOPHAGE-XR) 500 MG 24 hr tablet TAKE 1 TABLET BY MOUTH 4  TIMES DAILY FOR DIABETES 360 tablet 1  . Multiple Vitamin (MULTIVITAMIN) capsule Take 1 capsule by mouth daily.      . potassium chloride (KLOR-CON) 10 MEQ CR tablet Take 10 mEq by mouth daily.      . quinapril (ACCUPRIL) 40 MG tablet Take 1 tablet by mouth  every day for blood  pressure 90 tablet 1  . simvastatin (ZOCOR) 40 MG tablet TAKE 1 TABLET BY MOUTH AT  BEDTIME OR AS DIRECTED FOR  CHOLESTEROL 90 tablet 1  . vitamin C (ASCORBIC ACID) 500 MG tablet Take 500 mg by mouth daily.       No current facility-administered medications on file prior to visit.     Allergies: Allergies  Allergen Reactions  . Penicillins Other (See Comments)    Patient stated,"I was told not to take Penicillin anymore. I don't remember what kind of reaction I had."    Current Problems (verified) has Paroxysmal atrial fibrillation ([place]); MGUS (monoclonal gammopathy of unknown significance); Anemia, unspecified; Hypertension; Hyperlipidemia; T2_NIDDM w/CKD 2 (GFR 70 ml/min); BPH (benign prostatic hyperplasia); Testosterone deficiency; Medication management; Vitamin D deficiency; Diabetic mononeuritis multiplex, right abdomen; BMI 26.53,adult; Medicare annual wellness visit, subsequent; and Hypothyroidism on his problem list.  ROS   Objective:   Today's Vitals   09/13/16 0934  BP: 128/68  Pulse: 60  Resp: 16  Temp: 97 F (36.1 C)  Weight: 191 lb (86.6 kg)  Height: 5\' 11"  (1.803 m)   Body mass index is 26.64 kg/m.  General appearance: alert, no distress, WD/WN, male HEENT: normocephalic, sclerae anicteric, TMs pearly, nares patent, no discharge or erythema, pharynx normal Oral cavity: MMM, no lesions Neck: supple, no lymphadenopathy, no thyromegaly, no masses Heart: RRR, normal S1, S2, no murmurs Lungs: CTA bilaterally, no wheezes, rhonchi, or rales Abdomen:  +bs, soft, non tender, non distended, no masses, no hepatomegaly, no splenomegaly Musculoskeletal: nontender, no swelling, no obvious deformity Extremities: no edema, no cyanosis, no clubbing Pulses: 2+ symmetric, upper and lower extremities, normal cap refill Neurological: alert, oriented x 3, CN2-12 intact, strength normal upper extremities and lower extremities, sensation normal throughout, DTRs 2+ throughout, no cerebellar signs, gait normal Psychiatric: normal affect, behavior normal, pleasant     Vicie Mutters, PA-C   09/13/2016

## 2016-09-13 NOTE — Addendum Note (Signed)
Addended by: Melbourne Abts C on: 09/13/2016 10:44 AM   Modules accepted: Orders

## 2016-09-13 NOTE — Patient Instructions (Signed)
Try the melatonin 5mg -20mg  dissolvable or gummy 30 mins before bed     Bad carbs also include fruit juice, alcohol, and sweet tea. These are empty calories that do not signal to your brain that you are full.   Please remember the good carbs are still carbs which convert into sugar. So please measure them out no more than 1/2-1 cup of rice, oatmeal, pasta, and beans  Veggies are however free foods! Pile them on.   Not all fruit is created equal. Please see the list below, the fruit at the bottom is higher in sugars than the fruit at the top. Please avoid all dried fruits.

## 2016-09-14 LAB — HEMOGLOBIN A1C
Hgb A1c MFr Bld: 6.5 % — ABNORMAL HIGH (ref <5.7)
MEAN PLASMA GLUCOSE: 140 mg/dL

## 2016-09-14 LAB — VITAMIN D 25 HYDROXY (VIT D DEFICIENCY, FRACTURES): VIT D 25 HYDROXY: 68 ng/mL (ref 30–100)

## 2016-10-02 ENCOUNTER — Encounter: Payer: Self-pay | Admitting: *Deleted

## 2016-10-18 ENCOUNTER — Other Ambulatory Visit: Payer: Self-pay | Admitting: Internal Medicine

## 2016-10-18 MED ORDER — BISOPROLOL-HYDROCHLOROTHIAZIDE 10-6.25 MG PO TABS: 1.0000 | ORAL_TABLET | Freq: Every day | ORAL | 1 refills | Status: DC

## 2016-11-01 ENCOUNTER — Other Ambulatory Visit (HOSPITAL_BASED_OUTPATIENT_CLINIC_OR_DEPARTMENT_OTHER): Payer: Medicare Other

## 2016-11-01 DIAGNOSIS — D472 Monoclonal gammopathy: Secondary | ICD-10-CM | POA: Diagnosis not present

## 2016-11-01 LAB — COMPREHENSIVE METABOLIC PANEL
ALT: 16 U/L (ref 0–55)
AST: 17 U/L (ref 5–34)
Albumin: 3.5 g/dL (ref 3.5–5.0)
Alkaline Phosphatase: 69 U/L (ref 40–150)
Anion Gap: 8 mEq/L (ref 3–11)
BILIRUBIN TOTAL: 0.74 mg/dL (ref 0.20–1.20)
BUN: 10.2 mg/dL (ref 7.0–26.0)
CO2: 23 meq/L (ref 22–29)
Calcium: 9 mg/dL (ref 8.4–10.4)
Chloride: 109 mEq/L (ref 98–109)
Creatinine: 1 mg/dL (ref 0.7–1.3)
EGFR: 82 mL/min/{1.73_m2} — AB (ref >90)
GLUCOSE: 98 mg/dL (ref 70–140)
Potassium: 4.1 mEq/L (ref 3.5–5.1)
SODIUM: 141 meq/L (ref 136–145)
TOTAL PROTEIN: 6.3 g/dL — AB (ref 6.4–8.3)

## 2016-11-01 LAB — CBC WITH DIFFERENTIAL/PLATELET
BASO%: 0.9 % (ref 0.0–2.0)
Basophils Absolute: 0 10*3/uL (ref 0.0–0.1)
EOS%: 3.7 % (ref 0.0–7.0)
Eosinophils Absolute: 0.2 10*3/uL (ref 0.0–0.5)
HCT: 41.8 % (ref 38.4–49.9)
HGB: 13.6 g/dL (ref 13.0–17.1)
LYMPH%: 43.2 % (ref 14.0–49.0)
MCH: 28.4 pg (ref 27.2–33.4)
MCHC: 32.5 g/dL (ref 32.0–36.0)
MCV: 87.4 fL (ref 79.3–98.0)
MONO#: 0.5 10*3/uL (ref 0.1–0.9)
MONO%: 10.8 % (ref 0.0–14.0)
NEUT%: 41.4 % (ref 39.0–75.0)
NEUTROS ABS: 2 10*3/uL (ref 1.5–6.5)
Platelets: 155 10*3/uL (ref 140–400)
RBC: 4.78 10*6/uL (ref 4.20–5.82)
RDW: 13.8 % (ref 11.0–14.6)
WBC: 4.7 10*3/uL (ref 4.0–10.3)
lymph#: 2 10*3/uL (ref 0.9–3.3)

## 2016-11-04 LAB — MULTIPLE MYELOMA PANEL, SERUM
ALBUMIN SERPL ELPH-MCNC: 3.6 g/dL (ref 2.9–4.4)
ALPHA2 GLOB SERPL ELPH-MCNC: 0.6 g/dL (ref 0.4–1.0)
Albumin/Glob SerPl: 1.5 (ref 0.7–1.7)
Alpha 1: 0.2 g/dL (ref 0.0–0.4)
B-GLOBULIN SERPL ELPH-MCNC: 1.1 g/dL (ref 0.7–1.3)
Gamma Glob SerPl Elph-Mcnc: 0.6 g/dL (ref 0.4–1.8)
Globulin, Total: 2.5 g/dL (ref 2.2–3.9)
IGG (IMMUNOGLOBIN G), SERUM: 613 mg/dL — AB (ref 700–1600)
IGM (IMMUNOGLOBIN M), SRM: 14 mg/dL — AB (ref 15–143)
IgA, Qn, Serum: 253 mg/dL (ref 61–437)
M PROTEIN SERPL ELPH-MCNC: 0.4 g/dL — AB
TOTAL PROTEIN: 6.1 g/dL (ref 6.0–8.5)

## 2016-11-08 ENCOUNTER — Ambulatory Visit (HOSPITAL_BASED_OUTPATIENT_CLINIC_OR_DEPARTMENT_OTHER): Payer: Medicare Other | Admitting: Oncology

## 2016-11-08 ENCOUNTER — Telehealth: Payer: Self-pay | Admitting: Oncology

## 2016-11-08 VITALS — BP 137/60 | HR 57 | Temp 97.7°F | Resp 18 | Ht 71.0 in | Wt 192.4 lb

## 2016-11-08 DIAGNOSIS — D472 Monoclonal gammopathy: Secondary | ICD-10-CM | POA: Diagnosis not present

## 2016-11-08 NOTE — Progress Notes (Signed)
Hematology and Oncology Follow Up Visit  Earl [name] [id number] August 31, 1941 75 y.o. 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 He has normal quantitative immunoglobulins, small M spike of 0.24 g/dL and normal light chains.  Prior Therapy: Status post bone marrow biopsy on 02/14/2003 which showed 2% plasma cell infiltration.  Current therapy: Observation and surveillance on an annual basis.  Interim History:  Earl [name] presents today for a follow-up visit. Since the last visit, he reports doing very well without any changes in his health. He continues to do excellent quality of life and remains very active. He continues to participate in [place] work without any decline.   He has not reported any arthralgias or myalgias. He has not reported any opportunistic infections. He does not report any peripheral neuropathy or constitutional symptoms. Has not reported any recent hospitalization or illnesses.  He did reports his brother was diagnosed with amyloidosis although he does not report any other family members with malignant disorders.   He does not report any headaches or blurry vision or syncope. He does not report any fevers, chills or sweats. He does not report any chest pain, shortness of breath or difficulty breathing. He does not report any nausea, vomiting or abdominal pain. He does not report any frequency urgency or hesitancy. He does not report any skeletal complaints. Rest of his review of systems unremarkable.  Medications: I have reviewed the patient's current medications.  Current Outpatient Prescriptions  Medication Sig Dispense Refill  . aspirin 81 MG tablet Take 81 mg by mouth daily.    . bisoprolol-hydrochlorothiazide (ZIAC) 10-6.25 MG tablet Take 1 tablet by mouth daily. 90 tablet 1  . Cholecalciferol (VITAMIN D PO)  Take 5,000 Units by mouth daily.    . finasteride (PROSCAR) 5 MG tablet TAKE 1 TABLET BY MOUTH  EVERY DAY FOR PROSTATE 90 tablet 1  . gabapentin (NEURONTIN) 300 MG capsule TAKE 1 TO 2 CAPSULES BY  MOUTH AT NIGHT FOR PAIN 180 capsule 1  . glipiZIDE (GLUCOTROL) 5 MG tablet TAKE 1 TABLET BY MOUTH 3  TIMES DAILY BEFORE MEALS  FOR DIABETES 270 tablet 1  . levothyroxine (SYNTHROID, LEVOTHROID) 50 MCG tablet TAKE 1 TABLET BY MOUTH  DAILY BEFORE BREAKFAST 90 tablet 1  . Magnesium 250 MG TABS Take 250 mg by mouth daily.    . metFORMIN (GLUCOPHAGE-XR) 500 MG 24 hr tablet TAKE 1 TABLET BY MOUTH 4  TIMES DAILY FOR DIABETES 360 tablet 1  . Multiple Vitamin (MULTIVITAMIN) capsule Take 1 capsule by mouth daily.      . potassium chloride (KLOR-CON) 10 MEQ CR tablet Take 10 mEq by mouth daily.      . quinapril (ACCUPRIL) 40 MG tablet TAKE 1 TABLET BY MOUTH  EVERY DAY FOR BLOOD  PRESSURE 90 tablet 1  . simvastatin (ZOCOR) 40 MG tablet TAKE 1 TABLET BY MOUTH AT  BEDTIME OR AS DIRECTED FOR  CHOLESTEROL 90 tablet 1  . vitamin C (ASCORBIC ACID) 500 MG tablet Take 500 mg by mouth daily.       No current facility-administered medications for this visit.      Allergies:  Allergies  Allergen Reactions  . Penicillins Other (See Comments)    Patient stated,"I was told not to take Penicillin anymore. I don't remember what kind of reaction I had."    Past Medical History, Surgical history, Social history, and  Family History were reviewed and updated.   Physical Exam: Blood pressure 137/60, pulse (!) 57, temperature 97.7 F (36.5 C), temperature source Oral, resp. rate 18, height '5\' 11"'$  (1.803 m), weight 192 lb 6.4 oz (87.3 kg), SpO2 100 %. ECOG: 0 General appearance: Well-appearing gentleman without distress. Head: Normocephalic, without obvious abnormality no oral thrush noted. Neck: no adenopathy Lymph nodes: Cervical, supraclavicular, and axillary nodes normal. Heart:regular rate and rhythm, S1, S2 normal,  no murmur, click, rub or gallop Lung:chest clear, no wheezing, rales, normal symmetric air entry Abdomin: soft, non-tender, without masses or organomegaly rebound or guarding. EXT:no erythema, induration, or nodules   Lab Results: Lab Results  Component Value Date   WBC 4.7 11/01/2016   HGB 13.6 11/01/2016   HCT 41.8 11/01/2016   MCV 87.4 11/01/2016   PLT 155 11/01/2016     Chemistry      Component Value Date/Time   NA 141 11/01/2016 0826   K 4.1 11/01/2016 0826   CL 105 09/13/2016 1027   CL 101 10/23/2012 0802   CO2 23 11/01/2016 0826   BUN 10.2 11/01/2016 0826   CREATININE 1.0 11/01/2016 0826      Component Value Date/Time   CALCIUM 9.0 11/01/2016 0826   ALKPHOS 69 11/01/2016 0826   AST 17 11/01/2016 0826   ALT 16 11/01/2016 0826   BILITOT 0.74 11/01/2016 0826       Results for Earl [name] (MRN [id number]) as of 11/08/2016 08:26  Ref. Range 11/01/2016 08:26  M Protein SerPl Elph-Mcnc Latest Ref Range: Not Observed g/dL 0.4 (H)   Results for Earl [name] (MRN [id number]) as of 11/08/2016 08:26  Ref. Range 11/03/2014 08:09  M-SPIKE, % Latest Units: g/dL 0.24  Results for Earl [name] (MRN [id number]) as of 11/08/2016 08:26  Ref. Range 10/31/2013 15:05 11/03/2014 08:09 10/27/2015 08:06  IgA Latest Ref Range: 68 - 379 mg/dL 309 298 283    Impression and Plan:   75 year old gentleman with the following issues:  1. IgA lambda MGUS: His protein studies obtained on 11/01/2016 were reviewed today and discussed with the patient. These labs have not dramatically changed since his diagnosis in 2004. He has no signs or symptoms of end organ damage including no changes in his CBC or electrolytes.  The plan is to continue with active surveillance on an annual basis. We will stage him if he develops signs to suggest multiple myeloma. There is no evidence of amyloidosis is at this time.   2. Diabetes mellitus: Managed by his primary care physician.  3.  Hypertension: Managed by his primary care physician.  4. Follow-up: Will be in one year sooner if there is a problem.  [facility], MD 12/19/20178:39 AM

## 2016-11-08 NOTE — Telephone Encounter (Signed)
Appointments scheduled per 12/19 LOS. Patient given AVS report and calendars with future scheduled appointments. °

## 2016-12-05 ENCOUNTER — Other Ambulatory Visit: Payer: Self-pay | Admitting: Internal Medicine

## 2016-12-23 ENCOUNTER — Ambulatory Visit (INDEPENDENT_AMBULATORY_CARE_PROVIDER_SITE_OTHER): Payer: Medicare Other | Admitting: Internal Medicine

## 2016-12-23 ENCOUNTER — Encounter: Payer: Self-pay | Admitting: Internal Medicine

## 2016-12-23 VITALS — BP 134/62 | HR 64 | Temp 98.0°F | Resp 16 | Ht 71.0 in | Wt 195.0 lb

## 2016-12-23 DIAGNOSIS — R350 Frequency of micturition: Secondary | ICD-10-CM

## 2016-12-23 DIAGNOSIS — E1141 Type 2 diabetes mellitus with diabetic mononeuropathy: Secondary | ICD-10-CM | POA: Diagnosis not present

## 2016-12-23 DIAGNOSIS — E782 Mixed hyperlipidemia: Secondary | ICD-10-CM

## 2016-12-23 DIAGNOSIS — D472 Monoclonal gammopathy: Secondary | ICD-10-CM | POA: Diagnosis not present

## 2016-12-23 DIAGNOSIS — I10 Essential (primary) hypertension: Secondary | ICD-10-CM

## 2016-12-23 DIAGNOSIS — E559 Vitamin D deficiency, unspecified: Secondary | ICD-10-CM

## 2016-12-23 DIAGNOSIS — R6889 Other general symptoms and signs: Secondary | ICD-10-CM | POA: Diagnosis not present

## 2016-12-23 DIAGNOSIS — I480 Paroxysmal atrial fibrillation: Secondary | ICD-10-CM

## 2016-12-23 DIAGNOSIS — D649 Anemia, unspecified: Secondary | ICD-10-CM | POA: Diagnosis not present

## 2016-12-23 DIAGNOSIS — Z6826 Body mass index (BMI) 26.0-26.9, adult: Secondary | ICD-10-CM

## 2016-12-23 DIAGNOSIS — Z79899 Other long term (current) drug therapy: Secondary | ICD-10-CM

## 2016-12-23 DIAGNOSIS — E119 Type 2 diabetes mellitus without complications: Secondary | ICD-10-CM | POA: Diagnosis not present

## 2016-12-23 DIAGNOSIS — N182 Chronic kidney disease, stage 2 (mild): Secondary | ICD-10-CM

## 2016-12-23 DIAGNOSIS — E1122 Type 2 diabetes mellitus with diabetic chronic kidney disease: Secondary | ICD-10-CM

## 2016-12-23 DIAGNOSIS — E349 Endocrine disorder, unspecified: Secondary | ICD-10-CM

## 2016-12-23 DIAGNOSIS — E039 Hypothyroidism, unspecified: Secondary | ICD-10-CM

## 2016-12-23 DIAGNOSIS — N401 Enlarged prostate with lower urinary tract symptoms: Secondary | ICD-10-CM

## 2016-12-23 DIAGNOSIS — Z0001 Encounter for general adult medical examination with abnormal findings: Secondary | ICD-10-CM | POA: Diagnosis not present

## 2016-12-23 DIAGNOSIS — G587 Mononeuritis multiplex: Secondary | ICD-10-CM

## 2016-12-23 DIAGNOSIS — Z0000 Encounter for general adult medical examination without abnormal findings: Secondary | ICD-10-CM

## 2016-12-23 LAB — CBC WITH DIFFERENTIAL/PLATELET
Basophils Absolute: 0 cells/uL (ref 0–200)
Basophils Relative: 0 %
EOS PCT: 2 %
Eosinophils Absolute: 82 cells/uL (ref 15–500)
HCT: 37.6 % — ABNORMAL LOW (ref 38.5–50.0)
Hemoglobin: 12.9 g/dL — ABNORMAL LOW (ref 13.2–17.1)
Lymphocytes Relative: 43 %
Lymphs Abs: 1763 cells/uL (ref 850–3900)
MCH: 28.9 pg (ref 27.0–33.0)
MCHC: 34.3 g/dL (ref 32.0–36.0)
MCV: 84.3 fL (ref 80.0–100.0)
MONOS PCT: 12 %
MPV: 9.4 fL (ref 7.5–12.5)
Monocytes Absolute: 492 cells/uL (ref 200–950)
NEUTROS ABS: 1763 {cells}/uL (ref 1500–7800)
Neutrophils Relative %: 43 %
PLATELETS: 175 10*3/uL (ref 140–400)
RBC: 4.46 MIL/uL (ref 4.20–5.80)
RDW: 13.6 % (ref 11.0–15.0)
WBC: 4.1 10*3/uL (ref 3.8–10.8)

## 2016-12-23 LAB — BASIC METABOLIC PANEL WITH GFR
BUN: 16 mg/dL (ref 7–25)
CHLORIDE: 106 mmol/L (ref 98–110)
CO2: 26 mmol/L (ref 20–31)
CREATININE: 1.13 mg/dL (ref 0.70–1.18)
Calcium: 9.1 mg/dL (ref 8.6–10.3)
GFR, Est African American: 73 mL/min (ref >=60)
GFR, Est Non African American: 63 mL/min (ref >=60)
Glucose, Bld: 100 mg/dL — ABNORMAL HIGH (ref 65–99)
POTASSIUM: 4.1 mmol/L (ref 3.5–5.3)
SODIUM: 140 mmol/L (ref 135–146)

## 2016-12-23 LAB — HEPATIC FUNCTION PANEL
ALT: 14 U/L (ref 9–46)
AST: 19 U/L (ref 10–35)
Albumin: 3.8 g/dL (ref 3.6–5.1)
Alkaline Phosphatase: 56 U/L (ref 40–115)
BILIRUBIN DIRECT: 0.2 mg/dL (ref <=0.2)
BILIRUBIN TOTAL: 0.7 mg/dL (ref 0.2–1.2)
Indirect Bilirubin: 0.5 mg/dL (ref 0.2–1.2)
Total Protein: 6 g/dL — ABNORMAL LOW (ref 6.1–8.1)

## 2016-12-23 LAB — HEMOGLOBIN A1C
Hgb A1c MFr Bld: 6.3 % — ABNORMAL HIGH (ref <5.7)
MEAN PLASMA GLUCOSE: 134 mg/dL

## 2016-12-23 LAB — LIPID PANEL
CHOL/HDL RATIO: 2.5 ratio (ref <5.0)
CHOLESTEROL: 118 mg/dL (ref <200)
HDL: 48 mg/dL (ref >40)
LDL Cholesterol: 59 mg/dL (ref <100)
Triglycerides: 56 mg/dL (ref <150)
VLDL: 11 mg/dL (ref <30)

## 2016-12-23 LAB — TESTOSTERONE: TESTOSTERONE: 341 ng/dL (ref 250–827)

## 2016-12-23 LAB — TSH: TSH: 0.75 mIU/L (ref 0.40–4.50)

## 2016-12-23 NOTE — Progress Notes (Signed)
Patient ID: Earl [name], male   DOB: 1941/10/22, 76 y.o.   MRN: [id number]  MEDICARE ANNUAL WELLNESS VISIT AND FOLLOW UP Assessment:    1. Medication management -due quarterly - CBC with Differential/Platelet - BASIC METABOLIC PANEL WITH GFR - Hepatic function panel  2. Mixed hyperlipidemia -cont meds -well controlled and at goal - Lipid panel  3. Type 2 diabetes mellitus without complication, without long-term current use of insulin (HCC) -cont medications -diet and exercise -once daily blood sugar checks likely sufficient - Hemoglobin A1c  4. Hypothyroidism, unspecified type -cont levothyroxine - TSH  5. Testosterone deficiency -not likely great candidate - Testosterone  6. Essential hypertension -well controlled -dash diet -exercise as tolerated -monitor at home  7. Paroxysmal atrial fibrillation (HCC) -rate controlled -CHADsVasc score of 3 -declined further anticoagulation -takes daily baby ASA  8. Diabetic mononeuritis multiplex, right abdomen -try to keep A1c at goal -cont gabapentin  9. Type 2 diabetes mellitus with stage 2 chronic kidney disease, without long-term current use of insulin (HCC) -cont meds -monitor CBGs -diet and exercise -a1c  10. Benign prostatic hyperplasia with urinary frequency -cont finasteride  11. Anemia, unspecified type -CBC -iron and B12 supplements  12. BMI 26.53,adult -well controlled  13. Medicare annual wellness visit, subsequent -due next year  85. MGUS (monoclonal gammopathy of unknown significance) -followed by Dr. Alen Blew  15. Vitamin D deficiency -Cont VIt D    Over 30 minutes of exam, counseling, chart review, and critical decision making was performed  Future Appointments Date Time Provider [place]  04/14/2017 10:00 AM [name], MD [facility] None  10/25/2017 8:00 AM [facility] 1 [facility] None  11/01/2017 8:30 AM Wyatt Portela, MD [facility] None     Plan:    During the course of the visit the patient was educated and counseled about appropriate screening and preventive services including:    Pneumococcal vaccine   Influenza vaccine  Prevnar 13  Td vaccine  Screening electrocardiogram  Colorectal cancer screening  Diabetes screening  Glaucoma screening  Nutrition counseling    Subjective:  Earl [name] is a 76 y.o. male who presents for Medicare Annual Wellness Visit and 3 month follow up for HTN, hyperlipidemia, well controlled diabetes, and vitamin D Def.   His blood pressure has been controlled at home, today their BP is BP: 134/62 He does not workout. He denies chest pain, shortness of breath, dizziness. He has not been able to exercise recently as he is currently taking care of his wife who has endometrial cancer and is undergoing chemotherapy and likely radiation treatments.  This has been a source of great stress for him.    He is on cholesterol medication and denies myalgias. His cholesterol is at goal. The cholesterol last visit was:   Lab Results  Component Value Date   CHOL 118 12/23/2016   HDL 48 12/23/2016   LDLCALC 59 12/23/2016   TRIG 56 12/23/2016   CHOLHDL 2.5 12/23/2016   He has well controlled diabetes.  He checks his sugars up to 3 times a day.  He is never having hypoglycemic episodes.  His sugars are less than 140 in the mornings.  He is generally 160 after meals.  He is watching his diet.  He is not exercising currently. Lab Results  Component Value Date   HGBA1C 6.5 (H) 09/13/2016    Last GFR Lab Results  Component Value Date   [facility] 63 12/23/2016     Lab Results  Component Value Date  GFRAA 73 12/23/2016   Patient is on Vitamin D supplement.   Lab Results  Component Value Date   VD25OH 85 09/13/2016      He has been tired lately, but he thinks that some of this is secondary to stress.  He would like to see if his testosterone level is low.  He has been told it has been low  in the past.  He is not on therapy currently.    Medication Review: Current Outpatient Prescriptions on File Prior to Visit  Medication Sig Dispense Refill  . aspirin 81 MG tablet Take 81 mg by mouth daily.    [name] atenolol (TENORMIN) 100 MG tablet TAKE 1 TABLET BY MOUTH  DAILY 90 tablet 1  . bisoprolol-hydrochlorothiazide (ZIAC) 10-6.25 MG tablet Take 1 tablet by mouth daily. 90 tablet 1  . Cholecalciferol (VITAMIN D PO) Take 5,000 Units by mouth daily.    . finasteride (PROSCAR) 5 MG tablet TAKE 1 TABLET BY MOUTH  EVERY DAY FOR PROSTATE 90 tablet 1  . gabapentin (NEURONTIN) 300 MG capsule TAKE 1 TO 2 CAPSULES BY  MOUTH AT NIGHT FOR PAIN 180 capsule 1  . glipiZIDE (GLUCOTROL) 5 MG tablet TAKE 1 TABLET BY MOUTH 3  TIMES DAILY BEFORE MEALS  FOR DIABETES 270 tablet 1  . levothyroxine (SYNTHROID, LEVOTHROID) 50 MCG tablet TAKE 1 TABLET BY MOUTH  DAILY BEFORE BREAKFAST 90 tablet 1  . Magnesium 250 MG TABS Take 250 mg by mouth daily.    . metFORMIN (GLUCOPHAGE-XR) 500 MG 24 hr tablet TAKE 1 TABLET BY MOUTH 4  TIMES DAILY FOR DIABETES 360 tablet 1  . Multiple Vitamin (MULTIVITAMIN) capsule Take 1 capsule by mouth daily.      . potassium chloride (KLOR-CON) 10 MEQ CR tablet Take 10 mEq by mouth daily.      . quinapril (ACCUPRIL) 40 MG tablet TAKE 1 TABLET BY MOUTH  EVERY DAY FOR BLOOD  PRESSURE 90 tablet 1  . simvastatin (ZOCOR) 40 MG tablet TAKE 1 TABLET BY MOUTH AT  BEDTIME OR AS DIRECTED FOR  CHOLESTEROL 90 tablet 1  . vitamin C (ASCORBIC ACID) 500 MG tablet Take 500 mg by mouth daily.       No current facility-administered medications on file prior to visit.     Allergies: Allergies  Allergen Reactions  . Penicillins Other (See Comments)    Patient stated,"I was told not to take Penicillin anymore. 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  Screening Tests Immunization History  Administered Date(s) Administered  . DT 02/09/2015  . Influenza Split 08/07/2013  . Influenza, High Dose Seasonal PF 08/19/2014, 08/28/2015, 09/13/2016  . Pneumococcal Conjugate-13 06/10/2016  . Pneumococcal Polysaccharide-23 08/13/2008  . Td 11/22/2003    Preventative care: Last colonoscopy: 2013  Names of Other Physician/Practitioners you currently use: 1. [facility] here for primary care 2. Dr. Delman Cheadle, eye doctor, last visit 2017 3. Dr. Lavone Neri, dentist, last visit 2017 Patient Care Team: [name], MD as PCP - General (Internal Medicine) Inda Castle, MD as Consulting Physician (Gastroenterology) Wyatt Portela, MD as Consulting Physician (Oncology) Sharyne Peach, MD as Consulting  Physician (Ophthalmology)  Surgical: He  has a past surgical history that includes Inguinal hernia repair. Family His family history includes Diabetes in his father; Sickle cell anemia in his mother; Stomach cancer in his father. Social history  He reports that he has never smoked. He has never used smokeless tobacco. He reports that he does not drink alcohol or use drugs.  MEDICARE WELLNESS OBJECTIVES: Physical activity: Current Exercise Habits: The patient does not participate in regular exercise at present Cardiac risk factors: Cardiac Risk Factors include: advanced age (>67men, >67 women);diabetes mellitus;dyslipidemia;hypertension;male gender;sedentary lifestyle Depression/mood screen:   Depression screen [facility] 2/9 12/23/2016  Decreased Interest 0  Down, Depressed, Hopeless 0  PHQ - 2 Score 0     ADLs:  In your present state of health, do you have any difficulty performing the following activities: 12/23/2016 03/01/2016  Hearing? N N  Vision? N N  Difficulty concentrating or making decisions? N N  Walking or climbing stairs? N N  Dressing or bathing? N N  Doing errands, shopping? N N  Preparing Food and eating ? N -  Using the Toilet? N -  In the past six months, have you accidently leaked urine? N -  Do you have problems with loss of bowel control? N -  Managing your Medications? N -  Managing your Finances? N -  Housekeeping or managing your Housekeeping? N -  Some recent data might be hidden     Cognitive Testing  Alert? Yes  Normal Appearance?Yes  Oriented to person? Yes  Place? Yes   Time? Yes  Recall of three objects?  Yes  Can perform simple calculations? Yes  Displays appropriate judgment?Yes  Can read the correct time from a watch face?Yes  EOL planning: Does Patient Have a Medical Advance Directive?: No Would patient like information on creating a medical advance directive?: No - Patient declined   Objective:   Today's Vitals   12/23/16 0849  BP: 134/62  Pulse: 64  Resp: 16  Temp: 98 F (36.7 C)  TempSrc: Temporal  Weight: 195 lb (88.5 kg)  Height: 5\' 11"  (1.803 m)   Body mass index is 27.2 kg/m.  General appearance: alert, no distress, WD/WN, male HEENT: normocephalic, sclerae anicteric, TMs pearly, nares patent, no discharge or erythema, pharynx normal Oral cavity: MMM, no lesions Neck: supple, no lymphadenopathy, no thyromegaly, no masses Heart: RRR, normal S1, S2, no murmurs Lungs: CTA bilaterally, no wheezes, rhonchi, or rales Abdomen: +bs, soft, non tender, non distended, no masses, no hepatomegaly, no splenomegaly Musculoskeletal: nontender, no swelling, no obvious deformity Extremities: no edema, no cyanosis, no clubbing Pulses: 2+ symmetric, upper and lower extremities, normal cap refill Neurological: alert, oriented x 3, CN2-12  intact, strength normal upper extremities and lower extremities, sensation normal throughout, DTRs 2+ throughout, no cerebellar signs, gait normal Psychiatric: normal affect, behavior normal, pleasant   Medicare Attestation I have personally reviewed: The patient's medical and social history Their use of alcohol, tobacco or illicit drugs Their current medications and supplements The patient's functional ability including ADLs,fall risks, home safety risks, cognitive, and hearing and visual impairment Diet and physical activities Evidence for depression or mood disorders  The patient's weight, height, BMI, and visual acuity have been recorded in the chart.  I have made referrals, counseling, and provided education to the patient based on review of the above and I have provided the patient with a written personalized care plan for preventive services.     Starlyn Skeans, PA-C   12/23/2016

## 2016-12-23 NOTE — Progress Notes (Deleted)
Assessment and Plan:  Hypertension:  -Continue medication,  -monitor blood pressure at home.  -Continue DASH diet.   -Reminder to go to the ER if any CP, SOB, nausea, dizziness, severe HA, changes vision/speech, left arm numbness and tingling, and jaw pain.  Cholesterol: -Continue diet and exercise.  -Check cholesterol.   Pre-diabetes: -Continue diet and exercise.  -Check A1C  Vitamin D Def: -check level -continue medications.   Continue diet and meds as discussed. Further disposition pending results of labs.  HPI 76 y.o. male  presents for 3 month follow up with hypertension, hyperlipidemia, prediabetes and vitamin D.   His blood pressure has been controlled at home, today their BP is BP: 134/62.   He does not workout. He denies chest pain, shortness of breath, dizziness.   He is on cholesterol medication and denies myalgias. His cholesterol is at goal. The cholesterol last visit was:   Lab Results  Component Value Date   CHOL 132 09/13/2016   HDL 49 09/13/2016   LDLCALC 67 09/13/2016   TRIG 82 09/13/2016   CHOLHDL 2.7 09/13/2016     He has been working on diet and exercise for prediabetes, and denies foot ulcerations, hyperglycemia, hypoglycemia , increased appetite, nausea, paresthesia of the feet, polydipsia, polyuria, visual disturbances, vomiting and weight loss. Last A1C in the office was:  Lab Results  Component Value Date   HGBA1C 6.5 (H) 09/13/2016    Patient is on Vitamin D supplement.  Lab Results  Component Value Date   VD25OH 31 09/13/2016     He reports that he would like to have his testosterone checked today. He has had some fatigue and tiredness lately.     Current Medications:  Current Outpatient Prescriptions on File Prior to Visit  Medication Sig Dispense Refill  . aspirin 81 MG tablet Take 81 mg by mouth daily.    [name] atenolol (TENORMIN) 100 MG tablet TAKE 1 TABLET BY MOUTH  DAILY 90 tablet 1  . bisoprolol-hydrochlorothiazide (ZIAC) 10-6.25  MG tablet Take 1 tablet by mouth daily. 90 tablet 1  . Cholecalciferol (VITAMIN D PO) Take 5,000 Units by mouth daily.    . finasteride (PROSCAR) 5 MG tablet TAKE 1 TABLET BY MOUTH  EVERY DAY FOR PROSTATE 90 tablet 1  . gabapentin (NEURONTIN) 300 MG capsule TAKE 1 TO 2 CAPSULES BY  MOUTH AT NIGHT FOR PAIN 180 capsule 1  . glipiZIDE (GLUCOTROL) 5 MG tablet TAKE 1 TABLET BY MOUTH 3  TIMES DAILY BEFORE MEALS  FOR DIABETES 270 tablet 1  . levothyroxine (SYNTHROID, LEVOTHROID) 50 MCG tablet TAKE 1 TABLET BY MOUTH  DAILY BEFORE BREAKFAST 90 tablet 1  . Magnesium 250 MG TABS Take 250 mg by mouth daily.    . metFORMIN (GLUCOPHAGE-XR) 500 MG 24 hr tablet TAKE 1 TABLET BY MOUTH 4  TIMES DAILY FOR DIABETES 360 tablet 1  . Multiple Vitamin (MULTIVITAMIN) capsule Take 1 capsule by mouth daily.      . potassium chloride (KLOR-CON) 10 MEQ CR tablet Take 10 mEq by mouth daily.      . quinapril (ACCUPRIL) 40 MG tablet TAKE 1 TABLET BY MOUTH  EVERY DAY FOR BLOOD  PRESSURE 90 tablet 1  . simvastatin (ZOCOR) 40 MG tablet TAKE 1 TABLET BY MOUTH AT  BEDTIME OR AS DIRECTED FOR  CHOLESTEROL 90 tablet 1  . vitamin C (ASCORBIC ACID) 500 MG tablet Take 500 mg by mouth daily.       No current facility-administered medications on file  prior to visit.     Medical History:  Past Medical History:  Diagnosis Date  . Atrial fibrillation ([place])   . BPH (benign prostatic hyperplasia)   . Diabetes mellitus ([place])   . Hyperlipidemia   . Hypertension   . Other testicular hypofunction   . Type II or unspecified type diabetes mellitus without mention of complication, not stated as uncontrolled     Allergies:  Allergies  Allergen Reactions  . Penicillins Other (See Comments)    Patient stated,"I was told not to take Penicillin anymore. I don't remember what kind of reaction I had."     Review of Systems:  ROS  Family history- Review and unchanged  Social history- Review and unchanged  Physical Exam: BP 134/62    Pulse 64   Temp 98 F (36.7 C) (Temporal)   Resp 16   Ht 5\' 11"  (1.803 m)   Wt 195 lb (88.5 kg)   BMI 27.20 kg/m  Wt Readings from Last 3 Encounters:  12/23/16 195 lb (88.5 kg)  11/08/16 192 lb 6.4 oz (87.3 kg)  09/13/16 191 lb (86.6 kg)    General Appearance: Well nourished well developed, in no apparent distress. Eyes: PERRLA, EOMs, conjunctiva no swelling or erythema ENT/Mouth: Ear canals normal without obstruction, swelling, erythma, discharge.  TMs normal bilaterally.  Oropharynx moist, clear, without exudate, or postoropharyngeal swelling. Neck: Supple, thyroid normal,no cervical adenopathy  Respiratory: Respiratory effort normal, Breath sounds clear A&P without rhonchi, wheeze, or rale.  No retractions, no accessory usage. Cardio: RRR with no MRGs. Brisk peripheral pulses without edema.  Abdomen: Soft, + BS,  Non tender, no guarding, rebound, hernias, masses. Musculoskeletal: Full ROM, 5/5 strength, Normal gait Skin: Warm, dry without rashes, lesions, ecchymosis.  Neuro: Awake and oriented X 3, Cranial nerves intact. Normal muscle tone, no cerebellar symptoms. Psych: Normal affect, Insight and Judgment appropriate.    Starlyn Skeans, PA-C 9:03 AM [facility]

## 2017-01-07 ENCOUNTER — Other Ambulatory Visit: Payer: Self-pay | Admitting: Physician Assistant

## 2017-01-07 ENCOUNTER — Other Ambulatory Visit: Payer: Self-pay | Admitting: Internal Medicine

## 2017-04-04 ENCOUNTER — Other Ambulatory Visit: Payer: Self-pay | Admitting: Internal Medicine

## 2017-04-14 ENCOUNTER — Ambulatory Visit (INDEPENDENT_AMBULATORY_CARE_PROVIDER_SITE_OTHER): Payer: Medicare Other | Admitting: Internal Medicine

## 2017-04-14 VITALS — BP 124/72 | HR 64 | Temp 97.9°F | Resp 16 | Ht 71.0 in | Wt 192.2 lb

## 2017-04-14 DIAGNOSIS — I10 Essential (primary) hypertension: Secondary | ICD-10-CM | POA: Diagnosis not present

## 2017-04-14 DIAGNOSIS — E039 Hypothyroidism, unspecified: Secondary | ICD-10-CM

## 2017-04-14 DIAGNOSIS — Z0000 Encounter for general adult medical examination without abnormal findings: Secondary | ICD-10-CM | POA: Diagnosis not present

## 2017-04-14 DIAGNOSIS — N182 Chronic kidney disease, stage 2 (mild): Secondary | ICD-10-CM

## 2017-04-14 DIAGNOSIS — D472 Monoclonal gammopathy: Secondary | ICD-10-CM

## 2017-04-14 DIAGNOSIS — Z136 Encounter for screening for cardiovascular disorders: Secondary | ICD-10-CM

## 2017-04-14 DIAGNOSIS — Z79899 Other long term (current) drug therapy: Secondary | ICD-10-CM

## 2017-04-14 DIAGNOSIS — Z0001 Encounter for general adult medical examination with abnormal findings: Secondary | ICD-10-CM

## 2017-04-14 DIAGNOSIS — E1122 Type 2 diabetes mellitus with diabetic chronic kidney disease: Secondary | ICD-10-CM

## 2017-04-14 DIAGNOSIS — Z1212 Encounter for screening for malignant neoplasm of rectum: Secondary | ICD-10-CM

## 2017-04-14 DIAGNOSIS — E782 Mixed hyperlipidemia: Secondary | ICD-10-CM

## 2017-04-14 DIAGNOSIS — Z125 Encounter for screening for malignant neoplasm of prostate: Secondary | ICD-10-CM

## 2017-04-14 DIAGNOSIS — E559 Vitamin D deficiency, unspecified: Secondary | ICD-10-CM

## 2017-04-14 DIAGNOSIS — I480 Paroxysmal atrial fibrillation: Secondary | ICD-10-CM

## 2017-04-14 LAB — CBC WITH DIFFERENTIAL/PLATELET
BASOS PCT: 0 %
Basophils Absolute: 0 cells/uL (ref 0–200)
EOS PCT: 2 %
Eosinophils Absolute: 112 cells/uL (ref 15–500)
HEMATOCRIT: 39.3 % (ref 38.5–50.0)
Hemoglobin: 13.3 g/dL (ref 13.2–17.1)
LYMPHS PCT: 47 %
Lymphs Abs: 2632 cells/uL (ref 850–3900)
MCH: 29.2 pg (ref 27.0–33.0)
MCHC: 33.8 g/dL (ref 32.0–36.0)
MCV: 86.4 fL (ref 80.0–100.0)
MONO ABS: 560 {cells}/uL (ref 200–950)
MPV: 9.9 fL (ref 7.5–12.5)
Monocytes Relative: 10 %
NEUTROS PCT: 41 %
Neutro Abs: 2296 cells/uL (ref 1500–7800)
PLATELETS: 180 10*3/uL (ref 140–400)
RBC: 4.55 MIL/uL (ref 4.20–5.80)
RDW: 14 % (ref 11.0–15.0)
WBC: 5.6 10*3/uL (ref 3.8–10.8)

## 2017-04-14 LAB — TSH: TSH: 0.79 mIU/L (ref 0.40–4.50)

## 2017-04-14 NOTE — Patient Instructions (Signed)
Preventive Care for Adults  A healthy lifestyle and preventive care can promote health and wellness. Preventive health guidelines for men include the following key practices:  A routine yearly physical is a good way to check with your health care provider about your health and preventative screening. It is a chance to share any concerns and updates on your health and to receive a thorough exam.  Visit your dentist for a routine exam and preventative care every 6 months. Brush your teeth twice a day and floss once a day. Good oral hygiene prevents tooth decay and gum disease.  The frequency of eye exams is based on your age, health, family medical history, use of contact lenses, and other factors. Follow your health care provider's recommendations for frequency of eye exams.  Eat a healthy diet. Foods such as vegetables, fruits, whole grains, low-fat dairy products, and lean protein foods contain the nutrients you need without too many calories. Decrease your intake of foods high in solid fats, added sugars, and salt. Eat the right amount of calories for you.Get information about a proper diet from your health care provider, if necessary.  Regular physical exercise is one of the most important things you can do for your health. Most adults should get at least 150 minutes of moderate-intensity exercise (any activity that increases your heart rate and causes you to sweat) each week. In addition, most adults need muscle-strengthening exercises on 2 or more days a week.  Maintain a healthy weight. The body mass index (BMI) is a screening tool to identify possible weight problems. It provides an estimate of body fat based on height and weight. Your health care provider can find your BMI and can help you achieve or maintain a healthy weight.For adults 20 years and older:  A BMI below 18.5 is considered underweight.  A BMI of 18.5 to 24.9 is normal.  A BMI of 25 to 29.9 is considered overweight.  A  BMI of 30 and above is considered obese.  Maintain normal blood lipids and cholesterol levels by exercising and minimizing your intake of saturated fat. Eat a balanced diet with plenty of fruit and vegetables. Blood tests for lipids and cholesterol should begin at age 20 and be repeated every 5 years. If your lipid or cholesterol levels are high, you are over 50, or you are at high risk for heart disease, you may need your cholesterol levels checked more frequently.Ongoing high lipid and cholesterol levels should be treated with medicines if diet and exercise are not working.  If you smoke, find out from your health care provider how to quit. If you do not use tobacco, do not start.  Lung cancer screening is recommended for adults aged 55-80 years who are at high risk for developing lung cancer because of a history of smoking. A yearly low-dose CT scan of the lungs is recommended for people who have at least a 30-pack-year history of smoking and are a current smoker or have quit within the past 15 years. A pack year of smoking is smoking an average of 1 pack of cigarettes a day for 1 year (for example: 1 pack a day for 30 years or 2 packs a day for 15 years). Yearly screening should continue until the smoker has stopped smoking for at least 15 years. Yearly screening should be stopped for people who develop a health problem that would prevent them from having lung cancer treatment.  If you choose to drink alcohol, do not have more   than 2 drinks per day. One drink is considered to be 12 ounces (355 mL) of beer, 5 ounces (148 mL) of wine, or 1.5 ounces (44 mL) of liquor.  Avoid use of street drugs. Do not share needles with anyone. Ask for help if you need support or instructions about stopping the use of drugs.  High blood pressure causes heart disease and increases the risk of stroke. Your blood pressure should be checked at least every 1-2 years. Ongoing high blood pressure should be treated with  medicines, if weight loss and exercise are not effective.  If you are 45-79 years old, ask your health care provider if you should take aspirin to prevent heart disease.  Diabetes screening involves taking a blood sample to check your fasting blood sugar level. Testing should be considered at a younger age or be carried out more frequently if you are overweight and have at least 1 risk factor for diabetes.  Colorectal cancer can be detected and often prevented. Most routine colorectal cancer screening begins at the age of 50 and continues through age 75. However, your health care provider may recommend screening at an earlier age if you have risk factors for colon cancer. On a yearly basis, your health care provider may provide home test kits to check for hidden blood in the stool. Use of a small camera at the end of a tube to directly examine the colon (sigmoidoscopy or colonoscopy) can detect the earliest forms of colorectal cancer. Talk to your health care provider about this at age 50, when routine screening begins. Direct exam of the colon should be repeated every 5-10 years through age 75, unless early forms of precancerous polyps or small growths are found.  Hepatitis C blood testing is recommended for all people born from 1945 through 1965 and any individual with known risks for hepatitis C.  Screening for abdominal aortic aneurysm (AAA)  by ultrasound is recommended for people who have history of high blood pressure or who are current or former smokers.  Healthy men should  receive prostate-specific antigen (PSA) blood tests as part of routine cancer screening. Talk with your health care provider about prostate cancer screening.  Testicular cancer screening is  recommended for adult males. Screening includes self-exam, a health care provider exam, and other screening tests. Consult with your health care provider about any symptoms you have or any concerns you have about testicular  cancer.  Use sunscreen. Apply sunscreen liberally and repeatedly throughout the day. You should seek shade when your shadow is shorter than you. Protect yourself by wearing long sleeves, pants, a wide-brimmed hat, and sunglasses year round, whenever you are outdoors.  Once a month, do a whole-body skin exam, using a mirror to look at the skin on your back. Tell your health care provider about new moles, moles that have irregular borders, moles that are larger than a pencil eraser, or moles that have changed in shape or color.  Stay current with required vaccines (immunizations).  Influenza vaccine. All adults should be immunized every year.  Tetanus, diphtheria, and acellular pertussis (Td, Tdap) vaccine. An adult who has not previously received Tdap or who does not know his vaccine status should receive 1 dose of Tdap. This initial dose should be followed by tetanus and diphtheria toxoids (Td) booster doses every 10 years. Adults with an unknown or incomplete history of completing a 3-dose immunization series with Td-containing vaccines should begin or complete a primary immunization series including a Tdap dose. Adults   should receive a Td booster every 10 years.  Zoster vaccine. One dose is recommended for adults aged 60 years or older unless certain conditions are present.    PREVNAR - Pneumococcal 13-valent conjugate (PCV13) vaccine. When indicated, a person who is uncertain of his immunization history and has no record of immunization should receive the PCV13 vaccine. An adult aged 19 years or older who has certain medical conditions and has not been previously immunized should receive 1 dose of PCV13 vaccine. This PCV13 should be followed with a dose of pneumococcal polysaccharide (PPSV23) vaccine. The PPSV23 vaccine dose should be obtained at least 8 weeks after the dose of PCV13 vaccine. An adult aged 19 years or older who has certain medical conditions and previously received 1 or more doses  of PPSV23 vaccine should receive 1 dose of PCV13. The PCV13 vaccine dose should be obtained 1 or more years after the last PPSV23 vaccine dose.    PNEUMOVAX - Pneumococcal polysaccharide (PPSV23) vaccine. When PCV13 is also indicated, PCV13 should be obtained first. All adults aged 65 years and older should be immunized. An adult younger than age 65 years who has certain medical conditions should be immunized. Any person who resides in a nursing home or long-term care facility should be immunized. An adult smoker should be immunized. People with an immunocompromised condition and certain other conditions should receive both PCV13 and PPSV23 vaccines. People with human immunodeficiency virus (HIV) infection should be immunized as soon as possible after diagnosis. Immunization during chemotherapy or radiation therapy should be avoided. Routine use of PPSV23 vaccine is not recommended for American Indians, [place] Natives, or people younger than 65 years unless there are medical conditions that require PPSV23 vaccine. When indicated, people who have unknown immunization and have no record of immunization should receive PPSV23 vaccine. One-time revaccination 5 years after the first dose of PPSV23 is recommended for people aged 19-64 years who have chronic kidney failure, nephrotic syndrome, asplenia, or immunocompromised conditions. People who received 1-2 doses of PPSV23 before age 65 years should receive another dose of PPSV23 vaccine at age 65 years or later if at least 5 years have passed since the previous dose. Doses of PPSV23 are not needed for people immunized with PPSV23 at or after age 65 years.    Hepatitis A vaccine. Adults who wish to be protected from this disease, have certain high-risk conditions, work with hepatitis A-infected animals, work in hepatitis A research labs, or travel to or work in countries with a high rate of hepatitis A should be immunized. Adults who were previously unvaccinated  and who anticipate close contact with an international adoptee during the first 60 days after arrival in the [place] from a country with a high rate of hepatitis A should be immunized.    Hepatitis B vaccine. Adults should be immunized if they wish to be protected from this disease, have certain high-risk conditions, may be exposed to blood or other infectious body fluids, are household contacts or sex partners of hepatitis B positive people, are clients or workers in certain care facilities, or travel to or work in countries with a high rate of hepatitis B.   Preventive Service / Frequency   Ages 65 and over  Blood pressure check.  Lipid and cholesterol check.  Lung cancer screening. / Every year if you are aged 55-80 years and have a 30-pack-year history of smoking and currently smoke or have quit within the past 15 years. Yearly screening is stopped once   you have quit smoking for at least 15 years or develop a health problem that would prevent you from having lung cancer treatment.  Fecal occult blood test (FOBT) of stool. You may not have to do this test if you get a colonoscopy every 10 years.  Flexible sigmoidoscopy** or colonoscopy.** / Every 5 years for a flexible sigmoidoscopy or every 10 years for a colonoscopy beginning at age 50 and continuing until age 75.  Hepatitis C blood test.** / For all people born from 1945 through 1965 and any individual with known risks for hepatitis C.  Abdominal aortic aneurysm (AAA) screening./ Screening current or former smokers or have Hypertension.  Skin self-exam. / Monthly.  Influenza vaccine. / Every year.  Tetanus, diphtheria, and acellular pertussis (Tdap/Td) vaccine.** / 1 dose of Td every 10 years.   Zoster vaccine.** / 1 dose for adults aged 60 years or older.         Pneumococcal 13-valent conjugate (PCV13) vaccine.    Pneumococcal polysaccharide (PPSV23) vaccine.     Hepatitis A vaccine.** / Consult your health  care provider.  Hepatitis B vaccine.** / Consult your health care provider. Screening for abdominal aortic aneurysm (AAA)  by ultrasound is recommended for people who have history of high blood pressure or who are current or former smokers. ++++++++++ Recommend Adult Low Dose Aspirin or  coated  Aspirin 81 mg daily  To reduce risk of Colon Cancer 20 %,  Skin Cancer 26 % ,  Melanoma 46%  and  Pancreatic cancer 60% ++++++++++++++++++++++ Vitamin D goal  is between 70-100.  Please make sure that you are taking your Vitamin D as directed.  It is very important as a natural anti-inflammatory  helping hair, skin, and nails, as well as reducing stroke and heart attack risk.  It helps your bones and helps with mood. It also decreases numerous cancer risks so please take it as directed.  Low Vit D is associated with a 200-300% higher risk for CANCER  and 200-300% higher risk for HEART   ATTACK  &  STROKE.   ...................................... It is also associated with higher death rate at younger ages,  autoimmune diseases like Rheumatoid arthritis, Lupus, Multiple Sclerosis.    Also many other serious conditions, like depression, Alzheimer's Dementia, infertility, muscle aches, fatigue, fibromyalgia - just to name a few. ++++++++++++++++++++++ Recommend the book "The END of DIETING" by Dr Joel Fuhrman  & the book "The END of DIABETES " by Dr Joel Fuhrman At [employer] - get book & Audio CD's    Being diabetic has a  300% increased risk for heart attack, stroke, cancer, and alzheimer- type vascular dementia. It is very important that you work harder with diet by avoiding all foods that are white. Avoid white rice (brown & wild rice is OK), white potatoes (sweetpotatoes in moderation is OK), White bread or wheat bread or anything made out of white flour like bagels, donuts, rolls, buns, biscuits, cakes, pastries, cookies, pizza crust, and pasta (made from white flour & egg whites) -  vegetarian pasta or spinach or wheat pasta is OK. Multigrain breads like Arnold's or Pepper[employer], or multigrain sandwich thins or flatbreads.  Diet, exercise and weight loss can reverse and cure diabetes in the early stages.  Diet, exercise and weight loss is very important in the control and prevention of complications of diabetes which affects every system in your body, ie. Brain - dementia/stroke, eyes - glaucoma/blindness, heart - heart attack/heart failure, kidneys - dialysis,   stomach - gastric paralysis, intestines - malabsorption, nerves - severe painful neuritis, circulation - gangrene & loss of a leg(s), and finally cancer and Alzheimers.    I recommend avoid fried & greasy foods,  sweets/candy, white rice (brown or wild rice or Quinoa is OK), white potatoes (sweet potatoes are OK) - anything made from white flour - bagels, doughnuts, rolls, buns, biscuits,white and wheat breads, pizza crust and traditional pasta made of white flour & egg white(vegetarian pasta or spinach or wheat pasta is OK).  Multi-grain bread is OK - like multi-grain flat bread or sandwich thins. Avoid alcohol in excess. Exercise is also important.    Eat all the vegetables you want - avoid meat, especially red meat and dairy - especially cheese.  Cheese is the most concentrated form of trans-fats which is the worst thing to clog up our arteries. Veggie cheese is OK which can be found in the fresh produce section at [employer] or [employer] or [employer]  ++++++++++++++++++++++ DASH Eating Plan  DASH stands for "Dietary Approaches to Stop Hypertension."   The DASH eating plan is a healthy eating plan that has been shown to reduce high blood pressure (hypertension). Additional health benefits may include reducing the risk of type 2 diabetes mellitus, heart disease, and stroke. The DASH eating plan may also help with weight loss. WHAT DO I NEED TO KNOW ABOUT THE DASH EATING PLAN? For the DASH eating plan, you will  follow these general guidelines:  Choose foods with a percent daily value for sodium of less than 5% (as listed on the food label).  Use salt-free seasonings or herbs instead of table salt or sea salt.  Check with your health care provider or pharmacist before using salt substitutes.  Eat lower-sodium products, often labeled as "lower sodium" or "no salt added."  Eat fresh foods.  Eat more vegetables, fruits, and low-fat dairy products.  Choose whole grains. Look for the word "whole" as the first word in the ingredient list.  Choose fish   Limit sweets, desserts, sugars, and sugary drinks.  Choose heart-healthy fats.  Eat veggie cheese   Eat more home-cooked food and less restaurant, buffet, and fast food.  Limit fried foods.  Cook foods using methods other than frying.  Limit canned vegetables. If you do use them, rinse them well to decrease the sodium.  When eating at a restaurant, ask that your food be prepared with less salt, or no salt if possible.                      WHAT FOODS CAN I EAT? Read Dr Joel Fuhrman's books on The End of Dieting & The End of Diabetes  Grains Whole grain or whole wheat bread. Brown rice. Whole grain or whole wheat pasta. Quinoa, bulgur, and whole grain cereals. Low-sodium cereals. Corn or whole wheat flour tortillas. Whole grain cornbread. Whole grain crackers. Low-sodium crackers.  Vegetables Fresh or frozen vegetables (raw, steamed, roasted, or grilled). Low-sodium or reduced-sodium tomato and vegetable juices. Low-sodium or reduced-sodium tomato sauce and paste. Low-sodium or reduced-sodium canned vegetables.   Fruits All fresh, canned (in natural juice), or frozen fruits.  Protein Products  All fish and seafood.  Dried beans, peas, or lentils. Unsalted nuts and seeds. Unsalted canned beans.  Dairy Low-fat dairy products, such as skim or 1% milk, 2% or reduced-fat cheeses, low-fat ricotta or cottage cheese, or plain low-fat yogurt.  Low-sodium or reduced-sodium cheeses.  Fats and Oils Tub margarines   without trans fats. Light or reduced-fat mayonnaise and salad dressings (reduced sodium). Avocado. Safflower, olive, or canola oils. Natural peanut or almond butter.  Other Unsalted popcorn and pretzels. The items listed above may not be a complete list of recommended foods or beverages. Contact your dietitian for more options.  ++++++++++++++++++++  WHAT FOODS ARE NOT RECOMMENDED? Grains/ White flour or wheat flour White bread. White pasta. White rice. Refined cornbread. Bagels and croissants. Crackers that contain trans fat.  Vegetables  Creamed or fried vegetables. Vegetables in a . Regular canned vegetables. Regular canned tomato sauce and paste. Regular tomato and vegetable juices.  Fruits Dried fruits. Canned fruit in light or heavy syrup. Fruit juice.  Meat and Other Protein Products Meat in general - RED meat & White meat.  Fatty cuts of meat. Ribs, chicken wings, all processed meats as bacon, sausage, bologna, salami, fatback, hot dogs, bratwurst and packaged luncheon meats.  Dairy Whole or 2% milk, cream, half-and-half, and cream cheese. Whole-fat or sweetened yogurt. Full-fat cheeses or blue cheese. Non-dairy creamers and whipped toppings. Processed cheese, cheese spreads, or cheese curds.  Condiments Onion and garlic salt, seasoned salt, table salt, and sea salt. Canned and packaged gravies. Worcestershire sauce. Tartar sauce. Barbecue sauce. Teriyaki sauce. Soy sauce, including reduced sodium. Steak sauce. Fish sauce. Oyster sauce. Cocktail sauce. Horseradish. Ketchup and mustard. Meat flavorings and tenderizers. Bouillon cubes. Hot sauce. Tabasco sauce. Marinades. Taco seasonings. Relishes.  Fats and Oils Butter, stick margarine, lard, shortening and bacon fat. Coconut, palm kernel, or palm oils. Regular salad dressings.  Pickles and olives. Salted popcorn and pretzels.  The items listed above may  not be a complete list of foods and beverages to avoid.    

## 2017-04-14 NOTE — Progress Notes (Signed)
[place] [facility]   [name], M.D.      Uvaldo Bristle. Silverio Lay, P.A.-C [facility]                [address] [place], [place] [address] Telephone [phone] Telefax [phone] Annual  Screening/Preventative Visit  & Comprehensive Evaluation & Examination     This very nice 76 y.o. MBM presents for a Screening/Preventative Visit & comprehensive evaluation and management of multiple medical co-morbidities.  Patient has been followed for HTN, T2_NIDDM, Hyperlipidemia, Hypothyroidism and Vitamin D Deficiency. 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     HTN predates circa 1980. Patient's BP has been controlled at home.  Today's BP is at goal - 124/72. Patient denies any cardiac symptoms as chest pain, palpitations, shortness of breath, dizziness or ankle swelling. In 2013 , he had a transient episode of pAfib following a Colonoscopy and was evaluated by Dr Julianne Handler.    Patient's hyperlipidemia is controlled with diet and medications. Patient denies myalgias or other medication SE's. 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 Last A1c was not at goal:  Lab Results  Component Value Date   HGBA1C 6.3 (H) 12/23/2016       Patient has hx/o Low testosterone treated in 2013 & 2014 subsequenly stopped for lack of perceived benefit.  In June 2014 patient was dx/d with a Goiter and had and negative Bx and was started on thyroid suppression. Finally, patient has history of Vitamin D Deficiency ("30" in 2008)  and last vitamin D was at goal: Lab Results  Component Value Date   VD25OH 68 09/13/2016   Current Outpatient Prescriptions on File Prior to  Visit  Medication Sig  . aspirin 81 MG tablet Take 81 mg by mouth daily.  [name] atenolol (TENORMIN) 100 MG tablet TAKE 1 TABLET BY MOUTH  DAILY  . bisoprolol-hydrochlorothiazide (ZIAC) 10-6.25 MG tablet Take 1 tablet by mouth daily.  . Cholecalciferol (VITAMIN D PO) Take 5,000 Units by mouth daily.  . finasteride (PROSCAR) 5 MG tablet TAKE 1 TABLET BY MOUTH  EVERY DAY FOR PROSTATE  . gabapentin (NEURONTIN) 300 MG capsule TAKE 1 TO 2 CAPSULES BY  MOUTH AT NIGHT FOR PAIN  . glipiZIDE (GLUCOTROL) 5 MG tablet TAKE 1 TABLET BY MOUTH 3  TIMES DAILY BEFORE MEALS  FOR DIABETES  . levothyroxine (SYNTHROID, LEVOTHROID) 50 MCG tablet TAKE 1 TABLET BY MOUTH  DAILY BEFORE BREAKFAST  . Magnesium 250 MG TABS Take 250 mg by mouth daily.  . metFORMIN (GLUCOPHAGE-XR) 500 MG 24 hr tablet TAKE 1 TABLET BY MOUTH 4  TIMES DAILY FOR DIABETES  . Multiple Vitamin (MULTIVITAMIN) capsule Take 1 capsule by mouth daily.    . potassium chloride (KLOR-CON) 10 MEQ CR tablet Take 10 mEq by mouth daily.    . quinapril (ACCUPRIL) 40 MG tablet TAKE 1 TABLET BY MOUTH  EVERY DAY FOR BLOOD  PRESSURE  . simvastatin (ZOCOR) 40 MG tablet TAKE 1 TABLET BY MOUTH AT  BEDTIME OR AS DIRECTED FOR  CHOLESTEROL  . vitamin C (ASCORBIC ACID) 500 MG tablet  Take 500 mg by mouth daily.      Allergies  Allergen Reactions  . Penicillins    Past Medical History:  Diagnosis Date  . Atrial fibrillation ([place])   . BPH (benign prostatic hyperplasia)   . Diabetes mellitus ([place])   . Hyperlipidemia   . Hypertension   . Other testicular hypofunction   . Type II or unspecified type diabetes mellitus without mention of complication, not stated as uncontrolled    Health Maintenance  Topic Date Due  . FOOT EXAM  03/01/2017  . INFLUENZA VACCINE  06/21/2017  . HEMOGLOBIN A1C  06/22/2017  . OPHTHALMOLOGY EXAM  09/01/2017  . COLONOSCOPY  09/07/2017  . TETANUS/TDAP  02/08/2025  . PNA vac Low Risk Adult  Completed   Immunization History  Administered  Date(s) Administered  . DT 02/09/2015  . Influenza Split 08/07/2013  . Influenza, High Dose Seasonal PF 08/19/2014, 08/28/2015, 09/13/2016  . Pneumococcal Conjugate-13 06/10/2016  . Pneumococcal Polysaccharide-23 08/13/2008  . Td 11/22/2003   Past Surgical History:  Procedure Laterality Date  . INGUINAL HERNIA REPAIR     1968 and 2004   Family History  Problem Relation Age of Onset  . Colon cancer Neg Hx   . Sickle cell anemia Mother   . Diabetes Father   . Stomach cancer Father   . CAD Neg Hx    Social History   Social History  . Marital status: Married    Spouse name: N/A  . Number of children: 2  . Years of education: N/A   Occupational History  . Retired-Director of [employer]    Social History Main Topics  . Smoking status: Never Smoker  . Smokeless tobacco: Never Used  . Alcohol use No  . Drug use: No  . Sexual activity: Not on file    ROS Constitutional: Denies fever, chills, weight loss/gain, headaches, insomnia,  night sweats or change in appetite. Does c/o fatigue. Eyes: Denies redness, blurred vision, diplopia, discharge, itchy or watery eyes.  ENT: Denies discharge, congestion, post nasal drip, epistaxis, sore throat, earache, hearing loss, dental pain, Tinnitus, Vertigo, Sinus pain or snoring.  Cardio: Denies chest pain, palpitations, irregular heartbeat, syncope, dyspnea, diaphoresis, orthopnea, PND, claudication or edema Respiratory: denies cough, dyspnea, DOE, pleurisy, hoarseness, laryngitis or wheezing.  Gastrointestinal: Denies dysphagia, heartburn, reflux, water brash, pain, cramps, nausea, vomiting, bloating, diarrhea, constipation, hematemesis, melena, hematochezia, jaundice or hemorrhoids Genitourinary: Denies dysuria, frequency, urgency, nocturia, hesitancy, discharge, hematuria or flank pain Musculoskeletal: Denies arthralgia, myalgia, stiffness, Jt. Swelling, pain, limp or strain/sprain. Denies Falls. Skin: Denies puritis, rash, hives,  warts, acne, eczema or change in skin lesion Neuro: No weakness, tremor, incoordination, spasms, paresthesia or pain Psychiatric: Denies confusion, memory loss or sensory loss. Denies Depression. Endocrine: Denies change in weight, skin, hair change, nocturia, and paresthesia, diabetic polys, visual blurring or hyper / hypo glycemic episodes.  Heme/Lymph: No excessive bleeding, bruising or enlarged lymph nodes.  Physical Exam  BP 124/72   Pulse 64   Temp 97.9 F (36.6 C)   Resp 16   Ht 5\' 11"  (1.803 m)   Wt 192 lb 3.2 oz (87.2 kg)   BMI 26.81 kg/m   General Appearance: Well nourished and well groomed and in no apparent distress.  Eyes: PERRLA, EOMs, conjunctiva no swelling or erythema, normal fundi and vessels. Sinuses: No frontal/maxillary tenderness ENT/Mouth: EACs patent / TMs  nl. Nares clear without erythema, swelling, mucoid exudates. Oral hygiene is good. No erythema, swelling, or exudate. Tongue normal, non-obstructing.  Tonsils not swollen or erythematous. Hearing normal.  Neck: Supple, thyroid normal. No bruits, nodes or JVD. Respiratory: Respiratory effort normal.  BS equal and clear bilateral without rales, rhonci, wheezing or stridor. Cardio: Heart sounds are normal with regular rate and rhythm and no murmurs, rubs or gallops. Peripheral pulses are normal and equal bilaterally without edema. No aortic or femoral bruits. Chest: symmetric with normal excursions and percussion.  Abdomen: Soft, with Nl bowel sounds. Nontender, no guarding, rebound, hernias, masses, or organomegaly.  Lymphatics: Non tender without lymphadenopathy.  Genitourinary: No hernias.Testes nl. DRE - prostate nl for age - smooth & firm w/o nodules. Musculoskeletal: Full ROM all peripheral extremities, joint stability, 5/5 strength, and normal gait. Skin: Warm and dry without rashes, lesions, cyanosis, clubbing or  ecchymosis.  Neuro: Cranial nerves intact, reflexes equal bilaterally. Normal muscle tone,  no cerebellar symptoms. Sensation intact.  Pysch: Alert and oriented X 3 with normal affect, insight and judgment appropriate.   Assessment and Plan  1. Annual Preventative/Screening Exam   1. Encounter for general adult medical examination with abnormal findings   2. Essential hypertension  - EKG 12-Lead - [place], RETROPERITNL ABD,  LTD - Urinalysis, Routine w reflex microscopic - Microalbumin / creatinine urine ratio - CBC with Differential/Platelet - BASIC METABOLIC PANEL WITH GFR - Magnesium - TSH  3. Hyperlipidemia, mixed  - EKG 12-Lead - [place], RETROPERITNL ABD,  LTD - Hepatic function panel - Lipid panel - TSH  4. Type 2 diabetes mellitus with stage 2 chronic kidney disease, without long-term current use of insulin (HCC)  - EKG 12-Lead - [place], RETROPERITNL ABD,  LTD - Microalbumin / creatinine urine ratio - HM DIABETES FOOT EXAM - LOW EXTREMITY NEUR EXAM DOCUM - Hemoglobin A1c - Insulin, random  5. Vitamin D deficiency  - VITAMIN D 25 Hydroxy   6. Hypothyroidism  - TSH  7. Paroxysmal atrial fibrillation (HCC)  - EKG 12-Lead  8. Screening for ischemic heart disease  - EKG 12-Lead  9. Screening for AAA (aortic abdominal aneurysm)  - [place], RETROPERITNL ABD,  LTD  10. Screening for rectal cancer  - POC Hemoccult Bld/Stl   11. Prostate cancer screening  - PSA  12. MGUS   13. Medication management  - Urinalysis, Routine w reflex microscopic - Microalbumin / creatinine urine ratio - CBC with Differential/Platelet - BASIC METABOLIC PANEL WITH GFR - Hepatic function panel - Magnesium - Lipid panel - TSH - Hemoglobin A1c - Insulin, random - VITAMIN D 25 Hydroxy        Patient was counseled in prudent diet, weight control to achieve/maintain BMI less than 25, BP monitoring, regular exercise and medications as discussed.  Discussed med effects and SE's. Routine screening labs and tests as requested with regular follow-up as recommended. Over 40  minutes of exam, counseling, chart review and high complex critical decision making was performed

## 2017-04-15 ENCOUNTER — Encounter: Payer: Self-pay | Admitting: Internal Medicine

## 2017-04-15 LAB — BASIC METABOLIC PANEL WITH GFR
BUN: 16 mg/dL (ref 7–25)
CALCIUM: 9.1 mg/dL (ref 8.6–10.3)
CO2: 22 mmol/L (ref 20–31)
Chloride: 106 mmol/L (ref 98–110)
Creat: 1.17 mg/dL (ref 0.70–1.18)
GFR, EST AFRICAN AMERICAN: 70 mL/min (ref >=60)
GFR, EST NON AFRICAN AMERICAN: 61 mL/min (ref >=60)
GLUCOSE: 131 mg/dL — AB (ref 65–99)
POTASSIUM: 4.1 mmol/L (ref 3.5–5.3)
SODIUM: 141 mmol/L (ref 135–146)

## 2017-04-15 LAB — URINALYSIS, ROUTINE W REFLEX MICROSCOPIC
Bilirubin Urine: NEGATIVE
Glucose, UA: NEGATIVE
HGB URINE DIPSTICK: NEGATIVE
Ketones, ur: NEGATIVE
LEUKOCYTES UA: NEGATIVE
NITRITE: NEGATIVE
PH: 5.5 (ref 5.0–8.0)
PROTEIN: NEGATIVE
Specific Gravity, Urine: 1.015 (ref 1.001–1.035)

## 2017-04-15 LAB — LIPID PANEL
CHOL/HDL RATIO: 2.3 ratio (ref <5.0)
CHOLESTEROL: 116 mg/dL (ref <200)
HDL: 51 mg/dL (ref >40)
LDL CALC: 44 mg/dL (ref <100)
TRIGLYCERIDES: 103 mg/dL (ref <150)
VLDL: 21 mg/dL (ref <30)

## 2017-04-15 LAB — MICROALBUMIN / CREATININE URINE RATIO
CREATININE, URINE: 116 mg/dL (ref 20–370)
MICROALB/CREAT RATIO: 20 ug/mg{creat} (ref <30)
Microalb, Ur: 2.3 mg/dL

## 2017-04-15 LAB — HEPATIC FUNCTION PANEL
ALK PHOS: 60 U/L (ref 40–115)
ALT: 14 U/L (ref 9–46)
AST: 16 U/L (ref 10–35)
Albumin: 3.8 g/dL (ref 3.6–5.1)
BILIRUBIN DIRECT: 0.2 mg/dL (ref <=0.2)
BILIRUBIN INDIRECT: 0.5 mg/dL (ref 0.2–1.2)
BILIRUBIN TOTAL: 0.7 mg/dL (ref 0.2–1.2)
TOTAL PROTEIN: 6 g/dL — AB (ref 6.1–8.1)

## 2017-04-15 LAB — PSA: PSA: 0.3 ng/mL (ref <=4.0)

## 2017-04-15 LAB — HEMOGLOBIN A1C
Hgb A1c MFr Bld: 6.6 % — ABNORMAL HIGH (ref <5.7)
MEAN PLASMA GLUCOSE: 143 mg/dL

## 2017-04-15 LAB — MAGNESIUM: Magnesium: 1.7 mg/dL (ref 1.5–2.5)

## 2017-04-15 LAB — VITAMIN D 25 HYDROXY (VIT D DEFICIENCY, FRACTURES): Vit D, 25-Hydroxy: 68 ng/mL (ref 30–100)

## 2017-04-18 LAB — INSULIN, RANDOM: Insulin: 20 u[IU]/mL — ABNORMAL HIGH (ref 2.0–19.6)

## 2017-04-27 ENCOUNTER — Other Ambulatory Visit: Payer: Self-pay

## 2017-04-27 DIAGNOSIS — Z1212 Encounter for screening for malignant neoplasm of rectum: Secondary | ICD-10-CM

## 2017-04-27 LAB — POC HEMOCCULT BLD/STL (HOME/3-CARD/SCREEN)
FECAL OCCULT BLD: NEGATIVE
FECAL OCCULT BLD: NEGATIVE
Fecal Occult Blood, POC: NEGATIVE

## 2017-05-29 ENCOUNTER — Other Ambulatory Visit: Payer: Self-pay | Admitting: Internal Medicine

## 2017-06-07 ENCOUNTER — Other Ambulatory Visit: Payer: Self-pay | Admitting: Internal Medicine

## 2017-07-25 ENCOUNTER — Inpatient Hospital Stay (HOSPITAL_COMMUNITY)
Admission: EM | Admit: 2017-07-25 | Discharge: 2017-07-28 | DRG: 042 | Disposition: A | Discharge status: Rehabilitation Facility | Payer: Medicare Other | Attending: Internal Medicine | Admitting: Internal Medicine

## 2017-07-25 ENCOUNTER — Emergency Department (HOSPITAL_COMMUNITY): Payer: Medicare Other

## 2017-07-25 ENCOUNTER — Encounter (HOSPITAL_COMMUNITY): Payer: Self-pay

## 2017-07-25 DIAGNOSIS — R270 Ataxia, unspecified: Secondary | ICD-10-CM | POA: Diagnosis not present

## 2017-07-25 DIAGNOSIS — R350 Frequency of micturition: Secondary | ICD-10-CM | POA: Diagnosis not present

## 2017-07-25 DIAGNOSIS — K5900 Constipation, unspecified: Secondary | ICD-10-CM | POA: Diagnosis not present

## 2017-07-25 DIAGNOSIS — Z833 Family history of diabetes mellitus: Secondary | ICD-10-CM

## 2017-07-25 DIAGNOSIS — E119 Type 2 diabetes mellitus without complications: Secondary | ICD-10-CM | POA: Diagnosis present

## 2017-07-25 DIAGNOSIS — D472 Monoclonal gammopathy: Secondary | ICD-10-CM | POA: Diagnosis not present

## 2017-07-25 DIAGNOSIS — I129 Hypertensive chronic kidney disease with stage 1 through stage 4 chronic kidney disease, or unspecified chronic kidney disease: Secondary | ICD-10-CM | POA: Diagnosis present

## 2017-07-25 DIAGNOSIS — R112 Nausea with vomiting, unspecified: Secondary | ICD-10-CM | POA: Diagnosis not present

## 2017-07-25 DIAGNOSIS — I638 Other cerebral infarction: Secondary | ICD-10-CM | POA: Diagnosis not present

## 2017-07-25 DIAGNOSIS — R42 Dizziness and giddiness: Secondary | ICD-10-CM | POA: Diagnosis not present

## 2017-07-25 DIAGNOSIS — N401 Enlarged prostate with lower urinary tract symptoms: Secondary | ICD-10-CM | POA: Diagnosis not present

## 2017-07-25 DIAGNOSIS — I480 Paroxysmal atrial fibrillation: Secondary | ICD-10-CM | POA: Diagnosis present

## 2017-07-25 DIAGNOSIS — I493 Ventricular premature depolarization: Secondary | ICD-10-CM | POA: Diagnosis not present

## 2017-07-25 DIAGNOSIS — R29703 NIHSS score 3: Secondary | ICD-10-CM | POA: Diagnosis not present

## 2017-07-25 DIAGNOSIS — E1165 Type 2 diabetes mellitus with hyperglycemia: Secondary | ICD-10-CM | POA: Diagnosis not present

## 2017-07-25 DIAGNOSIS — H9319 Tinnitus, unspecified ear: Secondary | ICD-10-CM | POA: Diagnosis not present

## 2017-07-25 DIAGNOSIS — Z79899 Other long term (current) drug therapy: Secondary | ICD-10-CM | POA: Diagnosis not present

## 2017-07-25 DIAGNOSIS — E1122 Type 2 diabetes mellitus with diabetic chronic kidney disease: Secondary | ICD-10-CM | POA: Diagnosis present

## 2017-07-25 DIAGNOSIS — N182 Chronic kidney disease, stage 2 (mild): Secondary | ICD-10-CM | POA: Diagnosis present

## 2017-07-25 DIAGNOSIS — E039 Hypothyroidism, unspecified: Secondary | ICD-10-CM | POA: Diagnosis not present

## 2017-07-25 DIAGNOSIS — R531 Weakness: Secondary | ICD-10-CM | POA: Diagnosis not present

## 2017-07-25 DIAGNOSIS — I639 Cerebral infarction, unspecified: Secondary | ICD-10-CM | POA: Diagnosis not present

## 2017-07-25 DIAGNOSIS — I6930 Unspecified sequelae of cerebral infarction: Secondary | ICD-10-CM

## 2017-07-25 DIAGNOSIS — E785 Hyperlipidemia, unspecified: Secondary | ICD-10-CM | POA: Diagnosis present

## 2017-07-25 DIAGNOSIS — N400 Enlarged prostate without lower urinary tract symptoms: Secondary | ICD-10-CM | POA: Diagnosis not present

## 2017-07-25 DIAGNOSIS — I4891 Unspecified atrial fibrillation: Secondary | ICD-10-CM | POA: Diagnosis not present

## 2017-07-25 DIAGNOSIS — Z7982 Long term (current) use of aspirin: Secondary | ICD-10-CM

## 2017-07-25 DIAGNOSIS — I6340 Cerebral infarction due to embolism of unspecified cerebral artery: Secondary | ICD-10-CM | POA: Diagnosis not present

## 2017-07-25 DIAGNOSIS — R233 Spontaneous ecchymoses: Secondary | ICD-10-CM | POA: Diagnosis not present

## 2017-07-25 DIAGNOSIS — Z7984 Long term (current) use of oral hypoglycemic drugs: Secondary | ICD-10-CM | POA: Diagnosis not present

## 2017-07-25 DIAGNOSIS — I340 Nonrheumatic mitral (valve) insufficiency: Secondary | ICD-10-CM | POA: Diagnosis not present

## 2017-07-25 DIAGNOSIS — Z800 Family history of malignant neoplasm of digestive organs: Secondary | ICD-10-CM | POA: Diagnosis not present

## 2017-07-25 DIAGNOSIS — I10 Essential (primary) hypertension: Secondary | ICD-10-CM | POA: Diagnosis not present

## 2017-07-25 DIAGNOSIS — Z8673 Personal history of transient ischemic attack (TIA), and cerebral infarction without residual deficits: Secondary | ICD-10-CM

## 2017-07-25 DIAGNOSIS — R404 Transient alteration of awareness: Secondary | ICD-10-CM | POA: Diagnosis not present

## 2017-07-25 LAB — COMPREHENSIVE METABOLIC PANEL
ALK PHOS: 65 U/L (ref 38–126)
ALT: 19 U/L (ref 17–63)
AST: 24 U/L (ref 15–41)
Albumin: 3.7 g/dL (ref 3.5–5.0)
Anion gap: 11 (ref 5–15)
BUN: 14 mg/dL (ref 6–20)
CALCIUM: 9 mg/dL (ref 8.9–10.3)
CHLORIDE: 106 mmol/L (ref 101–111)
CO2: 22 mmol/L (ref 22–32)
CREATININE: 1.16 mg/dL (ref 0.61–1.24)
GFR calc Af Amer: >60 mL/min (ref >60)
GFR calc non Af Amer: 59 mL/min — ABNORMAL LOW (ref >60)
GLUCOSE: 330 mg/dL — AB (ref 65–99)
Potassium: 4.2 mmol/L (ref 3.5–5.1)
SODIUM: 139 mmol/L (ref 135–145)
Total Bilirubin: 0.7 mg/dL (ref 0.3–1.2)
Total Protein: 6.2 g/dL — ABNORMAL LOW (ref 6.5–8.1)

## 2017-07-25 LAB — URINALYSIS, ROUTINE W REFLEX MICROSCOPIC
Bacteria, UA: NONE SEEN
Bilirubin Urine: NEGATIVE
Glucose, UA: >=500 mg/dL — AB
HGB URINE DIPSTICK: NEGATIVE
Ketones, ur: 20 mg/dL — AB
Leukocytes, UA: NEGATIVE
Nitrite: NEGATIVE
PH: 6 (ref 5.0–8.0)
Protein, ur: 30 mg/dL — AB
SPECIFIC GRAVITY, URINE: 1.014 (ref 1.005–1.030)
Squamous Epithelial / LPF: NONE SEEN

## 2017-07-25 LAB — I-STAT TROPONIN, ED: Troponin i, poc: 0.03 ng/mL (ref 0.00–0.08)

## 2017-07-25 LAB — DIFFERENTIAL
BASOS ABS: 0 10*3/uL (ref 0.0–0.1)
Basophils Relative: 0 %
Eosinophils Absolute: 0 10*3/uL (ref 0.0–0.7)
Eosinophils Relative: 0 %
LYMPHS PCT: 12 %
Lymphs Abs: 1 10*3/uL (ref 0.7–4.0)
Monocytes Absolute: 0.6 10*3/uL (ref 0.1–1.0)
Monocytes Relative: 7 %
NEUTROS PCT: 81 %
Neutro Abs: 6.9 10*3/uL (ref 1.7–7.7)

## 2017-07-25 LAB — PROTIME-INR
INR: 0.99
Prothrombin Time: 13 seconds (ref 11.4–15.2)

## 2017-07-25 LAB — RAPID URINE DRUG SCREEN, HOSP PERFORMED
AMPHETAMINES: NOT DETECTED
BARBITURATES: NOT DETECTED
BENZODIAZEPINES: NOT DETECTED
Cocaine: NOT DETECTED
Opiates: NOT DETECTED
TETRAHYDROCANNABINOL: NOT DETECTED

## 2017-07-25 LAB — I-STAT CHEM 8, ED
BUN: 16 mg/dL (ref 6–20)
CREATININE: 1 mg/dL (ref 0.61–1.24)
Calcium, Ion: 1.07 mmol/L — ABNORMAL LOW (ref 1.15–1.40)
Chloride: 105 mmol/L (ref 101–111)
Glucose, Bld: 331 mg/dL — ABNORMAL HIGH (ref 65–99)
HEMATOCRIT: 40 % (ref 39.0–52.0)
HEMOGLOBIN: 13.6 g/dL (ref 13.0–17.0)
Potassium: 4.2 mmol/L (ref 3.5–5.1)
SODIUM: 139 mmol/L (ref 135–145)
TCO2: 22 mmol/L (ref 22–32)

## 2017-07-25 LAB — CBC
HCT: 38.2 % — ABNORMAL LOW (ref 39.0–52.0)
HEMOGLOBIN: 13.2 g/dL (ref 13.0–17.0)
MCH: 28.8 pg (ref 26.0–34.0)
MCHC: 34.6 g/dL (ref 30.0–36.0)
MCV: 83.2 fL (ref 78.0–100.0)
Platelets: 198 10*3/uL (ref 150–400)
RBC: 4.59 MIL/uL (ref 4.22–5.81)
RDW: 13.5 % (ref 11.5–15.5)
WBC: 8.5 10*3/uL (ref 4.0–10.5)

## 2017-07-25 LAB — CBG MONITORING, ED: Glucose-Capillary: 310 mg/dL — ABNORMAL HIGH (ref 65–99)

## 2017-07-25 LAB — ETHANOL: Alcohol, Ethyl (B): <5 mg/dL (ref <5)

## 2017-07-25 LAB — APTT: aPTT: 22 seconds — ABNORMAL LOW (ref 24–36)

## 2017-07-25 MED ORDER — ASPIRIN EC 81 MG PO TBEC
81.0000 mg | DELAYED_RELEASE_TABLET | Freq: Every day | ORAL | Status: DC
  Administered 2017-07-26: 81 mg via ORAL
  Filled 2017-07-25: qty 1

## 2017-07-25 MED ORDER — GABAPENTIN 300 MG PO CAPS
300.0000 mg | ORAL_CAPSULE | Freq: Two times a day (BID) | ORAL | Status: DC
  Administered 2017-07-25 – 2017-07-28 (×6): 300 mg via ORAL
  Filled 2017-07-25 (×6): qty 1

## 2017-07-25 MED ORDER — DIAZEPAM 2 MG PO TABS
2.0000 mg | ORAL_TABLET | Freq: Once | ORAL | Status: AC
  Administered 2017-07-25: 2 mg via ORAL
  Filled 2017-07-25: qty 1

## 2017-07-25 MED ORDER — FINASTERIDE 5 MG PO TABS
5.0000 mg | ORAL_TABLET | Freq: Every day | ORAL | Status: DC
  Administered 2017-07-26 – 2017-07-28 (×3): 5 mg via ORAL
  Filled 2017-07-25 (×3): qty 1

## 2017-07-25 MED ORDER — LEVOTHYROXINE SODIUM 25 MCG PO TABS
25.0000 ug | ORAL_TABLET | Freq: Every day | ORAL | Status: DC
  Administered 2017-07-26 – 2017-07-27 (×2): 25 ug via ORAL
  Filled 2017-07-25 (×2): qty 1

## 2017-07-25 MED ORDER — IOPAMIDOL (ISOVUE-370) INJECTION 76%
INTRAVENOUS | Status: AC
  Administered 2017-07-26: 50 mL
  Filled 2017-07-25: qty 50

## 2017-07-25 MED ORDER — MECLIZINE HCL 25 MG PO TABS
25.0000 mg | ORAL_TABLET | Freq: Once | ORAL | Status: AC
  Administered 2017-07-25: 25 mg via ORAL
  Filled 2017-07-25: qty 1

## 2017-07-25 MED ORDER — ONDANSETRON HCL 4 MG/2ML IJ SOLN
4.0000 mg | Freq: Once | INTRAMUSCULAR | Status: AC
  Administered 2017-07-25: 4 mg via INTRAVENOUS
  Filled 2017-07-25: qty 2

## 2017-07-25 MED ORDER — ASPIRIN 325 MG PO TABS
325.0000 mg | ORAL_TABLET | Freq: Once | ORAL | Status: AC
  Administered 2017-07-25: 325 mg via ORAL
  Filled 2017-07-25: qty 1

## 2017-07-25 MED ORDER — SIMVASTATIN 40 MG PO TABS
40.0000 mg | ORAL_TABLET | Freq: Every day | ORAL | Status: DC
  Administered 2017-07-25: 40 mg via ORAL
  Filled 2017-07-25: qty 1

## 2017-07-25 MED ORDER — POTASSIUM CHLORIDE CRYS ER 10 MEQ PO TBCR
10.0000 meq | EXTENDED_RELEASE_TABLET | Freq: Every day | ORAL | Status: DC
  Administered 2017-07-26 – 2017-07-28 (×3): 10 meq via ORAL
  Filled 2017-07-25 (×3): qty 1

## 2017-07-25 MED ORDER — INSULIN ASPART 100 UNIT/ML ~~LOC~~ SOLN
0.0000 [IU] | Freq: Three times a day (TID) | SUBCUTANEOUS | Status: DC
  Administered 2017-07-26: 5 [IU] via SUBCUTANEOUS
  Administered 2017-07-26: 7 [IU] via SUBCUTANEOUS
  Administered 2017-07-26: 2 [IU] via SUBCUTANEOUS
  Administered 2017-07-27: 7 [IU] via SUBCUTANEOUS
  Administered 2017-07-27: 3 [IU] via SUBCUTANEOUS
  Administered 2017-07-27: 5 [IU] via SUBCUTANEOUS
  Administered 2017-07-28: 7 [IU] via SUBCUTANEOUS
  Administered 2017-07-28: 5 [IU] via SUBCUTANEOUS
  Administered 2017-07-28: 2 [IU] via SUBCUTANEOUS

## 2017-07-25 MED ORDER — MAGNESIUM OXIDE 400 (241.3 MG) MG PO TABS
200.0000 mg | ORAL_TABLET | Freq: Every day | ORAL | Status: DC
  Administered 2017-07-26: 200 mg via ORAL
  Administered 2017-07-27: 400 mg via ORAL
  Administered 2017-07-28: 200 mg via ORAL
  Filled 2017-07-25 (×4): qty 1

## 2017-07-25 NOTE — ED Notes (Signed)
ED Provider at bedside. 

## 2017-07-25 NOTE — ED Triage Notes (Signed)
Pt from home with ems c.o n/v and dizziness. Pt also woke up this morning with right ear pain, pt denies pain now but is still very dizzy. Pt also been nauseated since he woke up this morning. Pt was unable to take his meds this morning, CBG elevated 314, BP 160/80 HR 70. 18 LFA, nad at this time

## 2017-07-25 NOTE — H&P (Addendum)
History and Physical    Ossie Beltran GYI:[id number] DOB: Apr 27, 1941 DOA: 07/25/2017  PCP: [name], MD   Patient coming from: Home  Chief Complaint: Dizziness  HPI: Demont Linford is a 76 y.o. male with medical history significant for type II DM, paroxysmal A. Fib, hypothyroidism, MGUS, who presented to the ED today with complaints of ear fullness bilaterally, with drinking and subsequently dizziness that started about 4 AM this morning presents while lying down, severe enough to prevent him from walking or getting up. It is associated vomiting- 5 times today, nonbloody, consistent recently ingested meals. No facial asymmetry, no vision change, slurred speech, no extremity weakness. Patient endorses recent good PO intake, no diarrhea, no black stools. Patient has been compliant with all his medications- which includes 4 antihypertensives, including aspirin '81mg'$  daily for prophylaxis.  Patient denies tobacco abuse history, alcohol use or illicit drug use.  ED Course: Blood pressure 165/79 otherwise stable vitals. Blood work showed normal electrolytes, creatinine at baseline 1.1, normal WBC 8.5 stable hemoglobin 13.2, negative i-STAT troponin, negative UDS and alcohol level. Head CT without contrast- acute right cerebellar SCA territory infarct. Subsequent CT and her head and neck. Right AICA distribution late acute/early subacute infarction. Neurology consulted in ED. Patient was given meclizine and vitamin ED with improvement in symptoms.  Review of Systems: As per HPI otherwise 10 point review of systems negative.   Past Medical History:  Diagnosis Date  . Atrial fibrillation ([place])   . BPH (benign prostatic hyperplasia)   . Diabetes mellitus ([place])   . Hyperlipidemia   . Hypertension   . Other testicular hypofunction   . Type II or unspecified type diabetes mellitus without mention of complication, not stated as uncontrolled     Past Surgical History:  Procedure  Laterality Date  . [place] and 2004     reports that he has never smoked. He has never used smokeless tobacco. He reports that he does not drink alcohol or use drugs.  Allergies  Allergen Reactions  . Penicillins Other (See Comments)    Was told to "no take this" Has patient had a PCN reaction causing immediate rash, facial/tongue/throat swelling, SOB or lightheadedness with hypotension: Unknown Has patient had a PCN reaction causing severe rash involving mucus membranes or skin necrosis: Unknown Has patient had a PCN reaction that required hospitalization: Unknown Has patient had a PCN reaction occurring within the last 10 years: No If all of the above answers are "NO", then may proceed with Cephalosporin use.     Family History  Problem Relation Age of Onset  . Sickle cell anemia Mother   . Diabetes Father   . Stomach cancer Father   . Colon cancer Neg Hx   . CAD Neg Hx     Prior to Admission medications   Medication Sig Start Date End Date Taking? Authorizing Provider  aspirin 81 MG tablet Take 81 mg by mouth daily.   Yes [provider]  atenolol (TENORMIN) 100 MG tablet TAKE 1 TABLET BY MOUTH  DAILY Patient taking differently: Take 100 mg by mouth once a day 05/29/17  Yes Vicie Mutters, PA-C  bisoprolol-hydrochlorothiazide [facility]) 10-6.25 MG tablet Take 1 tablet by mouth daily. 10/18/16  Yes [name], MD  Cholecalciferol (VITAMIN D PO) Take 5,000 Units by mouth daily.   Yes [provider]  Cyanocobalamin (B-12) 50 MCG TABS Take 50 mcg by mouth daily.   Yes [provider]  finasteride (PROSCAR)  5 MG tablet TAKE 1 TABLET BY MOUTH  EVERY DAY FOR PROSTATE Patient taking differently: Take 2.5 mg by mouth at bedtime 09/13/16  Yes [name], MD  gabapentin (NEURONTIN) 300 MG capsule TAKE 1 TO 2 CAPSULES BY  MOUTH AT NIGHT FOR PAIN Patient taking differently: Take 300-600 mg by mouth at bedtime for pain 01/08/17   Yes [name], MD  glipiZIDE (GLUCOTROL) 5 MG tablet TAKE 1 TABLET BY MOUTH 3  TIMES DAILY BEFORE MEALS  FOR DIABETES Patient taking differently: Take 5 mg by mouth three times a day before meals for diabetes 04/04/17  Yes Vicie Mutters, PA-C  levothyroxine (SYNTHROID, LEVOTHROID) 50 MCG tablet TAKE 1 TABLET BY MOUTH  DAILY BEFORE BREAKFAST Patient taking differently: Take 25 mcg by mouth in the morning before breakfast 09/13/16  Yes [name], MD  Magnesium 250 MG TABS Take 250 mg by mouth daily.   Yes [provider]  metFORMIN (GLUCOPHAGE-XR) 500 MG 24 hr tablet TAKE 1 TABLET BY MOUTH 4  TIMES DAILY FOR DIABETES Patient taking differently: Take 500 mg by mouth four times a day for diabetes 01/08/17  Yes [name], MD  Multiple Vitamin (MULTIVITAMIN) capsule Take 1 capsule by mouth daily.     Yes [provider]  potassium chloride (KLOR-CON) 10 MEQ CR tablet Take 10 mEq by mouth daily.     Yes [provider]  quinapril (ACCUPRIL) 40 MG tablet TAKE 1 TABLET BY MOUTH  EVERY DAY FOR BLOOD  PRESSURE Patient taking differently: Take 40 mg by mouth once a day for blood pressure 06/07/17  Yes [name], MD  simvastatin (ZOCOR) 40 MG tablet TAKE 1 TABLET BY MOUTH AT  BEDTIME OR AS DIRECTED FOR  CHOLESTEROL Patient taking differently: Take 20 mg by mouth in the evening on Mon/Wed/Fri 09/13/16  Yes [name], MD  vitamin C (ASCORBIC ACID) 500 MG tablet Take 500 mg by mouth daily.     Yes [provider]    Physical Exam: Vitals:   07/25/17 1830 07/25/17 1900 07/25/17 2030 07/25/17 2045  BP: (!) 162/84 (!) 160/77 (!) 158/82 (!) 172/80  Pulse: 69 65 74 71  Resp: (!) 27 (!) 24 (!) 21 (!) 22  Temp:      TempSrc:      SpO2: 99% 99% 99% 100%    Constitutional: NAD, calm, comfortable Vitals:   07/25/17 1830 07/25/17 1900 07/25/17 2030 07/25/17 2045  BP: (!) 162/84 (!) 160/77 (!) 158/82 (!) 172/80  Pulse: 69 65 74 71  Resp: (!)  27 (!) 24 (!) 21 (!) 22  Temp:      TempSrc:      SpO2: 99% 99% 99% 100%   Eyes: PERRL, lids and conjunctivae normal ENMT: Mucous membranes are moist. Posterior pharynx clear of any exudate or lesions.Normal dentition.  Neck: normal, supple, no masses, no thyromegaly Appreciated Respiratory: clear to auscultation bilaterally, no wheezing, no crackles. Normal respiratory effort. No accessory muscle use.  Cardiovascular: Regular rate and rhythm, no murmurs / rubs / gallops. No extremity edema. 2+ pedal pulses. No carotid bruits.  Abdomen: no tenderness, no masses palpated. No hepatosplenomegaly. Bowel sounds positive.  Musculoskeletal: no clubbing / cyanosis. No joint deformity upper and lower extremities. Good ROM, no contractures. Normal muscle tone.  Skin: no rashes, lesions, ulcers. No induration Neurologic: CN 2-12 grossly intact. Sensation intact, DTR normal. Strength 5/5 in all 4. Normal heel-to-shin test,  rapid alternating movement and finger to nose delayed on right upper extremity.  Psychiatric: Normal judgment and insight. Alert and oriented x 3. Normal mood.   Labs on Admission: I have personally reviewed following labs and imaging studies  CBC:  Recent Labs Lab 07/25/17 1720 07/25/17 1732  WBC 8.5  --   NEUTROABS 6.9  --   HGB 13.2 13.6  HCT 38.2* 40.0  MCV 83.2  --   PLT 198  --    Basic Metabolic Panel:  Recent Labs Lab 07/25/17 1720 07/25/17 1732  NA 139 139  K 4.2 4.2  CL 106 105  CO2 22  --   GLUCOSE 330* 331*  BUN 14 16  CREATININE 1.16 1.00  CALCIUM 9.0  --    GFR: CrCl cannot be calculated (Unknown ideal weight.). Liver Function Tests:  Recent Labs Lab 07/25/17 1720  AST 24  ALT 19  ALKPHOS 65  BILITOT 0.7  PROT 6.2*  ALBUMIN 3.7   Coagulation Profile:  Recent Labs Lab 07/25/17 1720  INR 0.99   CBG:  Recent Labs Lab 07/25/17 1718  GLUCAP 310*   Urine analysis:    Component Value Date/Time   COLORURINE YELLOW 07/25/2017  1911   APPEARANCEUR CLEAR 07/25/2017 1911   LABSPEC 1.014 07/25/2017 1911   PHURINE 6.0 07/25/2017 1911   GLUCOSEU >=500 (A) 07/25/2017 1911   HGBUR NEGATIVE 07/25/2017 1911   BILIRUBINUR NEGATIVE 07/25/2017 1911   KETONESUR 20 (A) 07/25/2017 1911   PROTEINUR 30 (A) 07/25/2017 1911   UROBILINOGEN 0.2 05/05/2015 1443   NITRITE NEGATIVE 07/25/2017 1911   LEUKOCYTESUR NEGATIVE 07/25/2017 1911    Radiological Exams on Admission: Ct Head Wo Contrast  Result Date: 07/25/2017 CLINICAL DATA:  Weakness and ataxia. EXAM: CT HEAD WITHOUT CONTRAST TECHNIQUE: Contiguous axial images were obtained from the base of the skull through the vertex without intravenous contrast. COMPARISON:  None. FINDINGS: Brain: There is a moderate-sized, wedge-shaped region of edema in the right cerebellum consistent with acute SCA territory infarct. No intracranial hemorrhage, mass, midline shift, or extra-axial fluid collection is identified. Mild cerebral atrophy is within normal limits for age. Vascular: Carotid siphon atherosclerosis.  No hyperdense vessel. Skull: No fracture focal osseous lesion. Sinuses/Orbits: Visualized paranasal sinuses and mastoid air cells are clear. Bilateral cataract extraction is noted. Other: None. IMPRESSION: 1. Acute right cerebellar SCA territory infarct. 2. No intracranial hemorrhage. Electronically Signed   By: Logan Bores M.D.   On: 07/25/2017 17:53    EKG: pending.  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 Patient has been compliant with 81 mg aspirin daily. Symptoms have improved with meclizine and Valium given in ED  - neurology following, recs appreciated, stroke workup - MRI brain - Lipid panel him  A1c a.m. - Aspirin 325 mg 1 continue '81mg'$  daily a.m. might need Plavix, pending neuro recommendations - Will switch home simvastatin to moderate to high intensity atorvastatin 40 mg daily - Echocardiogram - No carotid Doppler ordered as CTA head and neck has been done - Allow for permissive hypertension hold home hypertensives the setting of acute CVA - PT/OT - Passed bedside swallow evaluation - TID meclizine - Negative orthostatic vitals  Hypertension- blood pressure 694W to 546E systolic.  - Home medications atenolol, bisoprolol HCTZ, quinapril- held to allow for permissive hypertension -  PRN hydralazine  Tinnitus- considering CVA on CT scan. Likely cause of patient's dizziness is CVA.    Diabetes mellitus-  - Hemoglobin A1c a.m. as part of stroke workup - SSI - Hold antihypertensives  History of MGUS- Notes 10/2016 - Follows with Dr. Alen Blew- continue with active surveillance on an annual basis. We will stage him if he develops signs to suggest multiple myeloma. There is no evidence of amyloidosis is at this time. 12/20176. - Follow-up as outpatient.  Transient episode of atrial fibrillation- follow-up colonoscopy in 2013. Evaluated by cardiologist Dr. Angelena Form. Had event monitor which showed normal sinus rhythm with frequent PACs and rare PVCs. Patient was started on aspirin 325 mg by mouth daily.  DVT prophylaxis: Lovenox Code Status: Full Family Communication: Spouse at bedside Disposition Plan: To be determined Consults called: Neurology Admission status: Observation telemetry   Bethena Roys MD [facility] Pager [phone]  If 7PM-7AM, please contact night-coverage [email or web address] Password [facility]  07/25/2017, 9:33 PM

## 2017-07-25 NOTE — Consult Note (Signed)
Neurology Consultation Reason for Consult: Cerebellar stroke Referring Physician: Sabra Heck, B  CC: Right-sided weakness  History is obtained from: Patient  HPI: Earl [name] is a 76 y.o. male with a history of    diabetes and hypertension. He has atrial fibrillation documented in his past medical history, but according to   His most recent cardiology's note (2013) there was no evidence for this and the patient states he was never diagnosed with it.   He was in his normal state of health until this morning around 4 AM when he noticed that he was very dizzy. He is also nauseated. When he was better this afternoon, he sought further care and had a CT scan which demonstrates a moderate sized cerebellar infarct.    LKW: 4 AM tpa given?: no, outside of window   ROS: A 14 point ROS was performed and is negative except as noted in the HPI.   Past Medical History:  Diagnosis Date  . Atrial fibrillation ([place])   . BPH (benign prostatic hyperplasia)   . Diabetes mellitus ([place])   . Hyperlipidemia   . Hypertension   . Other testicular hypofunction   . Type II or unspecified type diabetes mellitus without mention of complication, not stated as uncontrolled      Family History  Problem Relation Age of Onset  . Sickle cell anemia Mother   . Diabetes Father   . Stomach cancer Father   . Colon cancer Neg Hx   . CAD Neg Hx      Social History:  reports that he has never smoked. He has never used smokeless tobacco. He reports that he does not drink alcohol or use drugs.   Exam: Current vital signs: BP (!) 172/80   Pulse 71   Temp 98.7 F (37.1 C)   Resp (!) 22   SpO2 100%  Vital signs in last 24 hours: Temp:  [98.7 F (37.1 C)] 98.7 F (37.1 C) (09/04 1754) Pulse Rate:  [65-88] 71 (09/04 2045) Resp:  [20-31] 22 (09/04 2045) BP: (158-172)/(77-84) 172/80 (09/04 2045) SpO2:  [99 %-100 %] 100 % (09/04 2045)   Physical Exam  Constitutional: Appears well-developed and  well-nourished.  Psych: Affect appropriate to situation Eyes: No scleral injection HENT: No OP obstrucion Head: Normocephalic.  Cardiovascular: Normal rate and regular rhythm.  Respiratory: Effort normal and breath sounds normal to anterior ascultation GI: Soft.  No distension. There is no tenderness.  Skin: WDI  Neuro: Mental Status: Patient is awake, alert, oriented to person, place, month, year, and situation. Patient is able to give a clear and coherent history. No signs of aphasia or neglect Cranial Nerves: II: Visual Fields are full. Pupils are equal, round, and reactive to light.   III,IV, VI: EOMI without ptosis or diploplia.  V: Facial sensation is symmetric to temperature VII: Facial movement is symmetric.  VIII: hearing is intact to voice X: Uvula elevates symmetrically XI: Shoulder shrug is symmetric. XII: tongue is midline without atrophy or fasciculations.  Motor: Tone is normal. Bulk is normal. 5/5 throughout. Sensory: Sensation is symmetric to light touch and temperature in the arms and legs. 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 Etiology is unclear, but I suspect atherosclerotic disease. This also could be embolic. He  will need further workup.  Recommendations: 1. HgbA1c, fasting lipid panel 2. MRI of the brain without contrast 3. Frequent neuro checks 4. Echocardiogram 5. CT angiogram of the head and neck 6. Prophylactic therapy-Antiplatelet med: Aspirin - dose 325mg  PO or 300mg  PR 7. Risk factor modification 8. Telemetry monitoring 9. PT consult, OT consult, Speech consult 10. please page stroke NP  Or  PA  Or MD  from 8am -4 pm as this patient will be followed by the stroke team at this point.    You can look them up on [email or web address]      Roland Rack, MD [facility] [phone]  If 7pm- 7am, please page neurology on call as listed in [place].

## 2017-07-25 NOTE — ED Notes (Signed)
Patient transported to CT 

## 2017-07-25 NOTE — ED Provider Notes (Signed)
[facility] DEPT Provider Note  CSN: [id number] Arrival date & time: 07/25/17  1703   History   Chief Complaint Chief Complaint  Patient presents with  . Nausea  . Otalgia  . Dizziness   HPI Earl [name] is a 76 y.o. male.  HPI  The patient is a 76 year old male, he has a known history of diabetes hypertension and hyperlipidemia who is very high functioning, does all of his daily care who woke up this morning around 5:30 in the morning which is 12 hours prior to arrival with a feeling of severe dizziness. The patient states that the room is constantly spitting, he has no balance, he had some ear pain but that your pain has totally gone away and he is pain-free at this time. He is unable to walk, his dizziness is persistent.  Initially the patient did have some feeling of tinnitus but that has resolved as well. He denies weakness of the arms or the legs, difficulty with speech, difficulty with vision other than feeling like the room is spinning. He has no prior history of neurologic abnormalities, no prior history of chest pain shortness of breath fevers chills stiff neck or any other complaints.  Past Medical History:  Diagnosis Date  . Atrial fibrillation ([place])   . BPH (benign prostatic hyperplasia)   . Diabetes mellitus ([place])   . Hyperlipidemia   . Hypertension   . Other testicular hypofunction   . Type II or unspecified type diabetes mellitus without mention of complication, not stated as uncontrolled     Patient Active Problem List   Diagnosis Date Noted  . Hypothyroidism 09/13/2016  . BMI 26.53,adult 08/28/2015  . Medicare annual wellness visit, subsequent 08/28/2015  . 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 Vitamin D deficiency 04/29/2014  . Medication management 01/03/2014  . 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 BPH (benign prostatic hyperplasia)   . Testosterone deficiency   . 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 Anemia 11/07/2013  . Hypertension 11/07/2013  . Hyperlipidemia 11/07/2013  . Paroxysmal atrial fibrillation ([place]) 09/18/2012    Past Surgical History:  Procedure Laterality Date  . [place] and 2004       Home Medications    Prior to Admission medications   Medication Sig Start Date End Date Taking? Authorizing Provider  aspirin 81 MG tablet Take 81 mg by mouth daily.    [provider]  atenolol (TENORMIN) 100 MG tablet TAKE 1 TABLET BY MOUTH  DAILY 05/29/17   Vicie Mutters, PA-C  bisoprolol-hydrochlorothiazide [facility]) 10-6.25 MG tablet Take 1 tablet by mouth daily. 10/18/16   [name], MD  Cholecalciferol (VITAMIN D PO) Take 5,000 Units by mouth daily.    [provider]  finasteride (PROSCAR) 5 MG tablet TAKE 1 TABLET BY MOUTH  EVERY DAY FOR PROSTATE 09/13/16   [name], MD  gabapentin (NEURONTIN) 300 MG capsule TAKE 1 TO 2 CAPSULES BY  MOUTH AT NIGHT FOR PAIN 01/08/17   [name], MD  glipiZIDE (GLUCOTROL) 5 MG tablet TAKE 1 TABLET BY MOUTH 3  TIMES DAILY BEFORE MEALS  FOR DIABETES 04/04/17   Vicie Mutters, PA-C  levothyroxine (SYNTHROID, LEVOTHROID) 50 MCG tablet TAKE 1 TABLET BY MOUTH  DAILY BEFORE BREAKFAST 09/13/16   [name], MD  Magnesium 250 MG TABS Take 250 mg by mouth daily.    [provider]  metFORMIN (GLUCOPHAGE-XR) 500 MG 24 hr tablet TAKE  1 TABLET BY MOUTH 4  TIMES DAILY FOR DIABETES 01/08/17   [name], MD  Multiple Vitamin (MULTIVITAMIN) capsule Take 1 capsule by mouth daily.      [provider]  potassium chloride (KLOR-CON) 10 MEQ CR tablet Take 10 mEq by mouth daily.      [provider]  quinapril (ACCUPRIL) 40 MG tablet TAKE 1 TABLET BY MOUTH  EVERY DAY FOR BLOOD  PRESSURE 06/07/17   [name], MD  simvastatin (ZOCOR) 40 MG tablet TAKE 1 TABLET BY MOUTH AT  BEDTIME OR AS DIRECTED FOR  CHOLESTEROL 09/13/16   [name], MD    vitamin C (ASCORBIC ACID) 500 MG tablet Take 500 mg by mouth daily.      [provider]    Family History Family History  Problem Relation Age of Onset  . Sickle cell anemia Mother   . Diabetes Father   . Stomach cancer Father   . Colon cancer Neg Hx   . CAD Neg Hx     Social History Social History  Substance Use Topics  . Smoking status: Never Smoker  . Smokeless tobacco: Never Used  . Alcohol use No     Allergies   Penicillins   Review of Systems Review of Systems  Constitutional: Negative for fever.  Gastrointestinal: Positive for vomiting.  All other systems reviewed and are negative.    Physical Exam Updated Vital Signs BP (!) 165/79 (BP Location: Right Arm)   Pulse 88   Temp 98.7 F (37.1 C) (Oral)   SpO2 100%   Physical Exam  Constitutional: He appears well-developed and well-nourished. He appears distressed.  HENT:  Head: Normocephalic and atraumatic.  Mouth/Throat: Oropharynx is clear and moist. No oropharyngeal exudate.  Eyes: Pupils are equal, round, and reactive to light. Conjunctivae and EOM are normal. Right eye exhibits no discharge. Left eye exhibits no discharge. No scleral icterus.  Nystagmus with the fast component to the right, constant regardless of position  Neck: Normal range of motion. Neck supple. No JVD present. No thyromegaly present.  Cardiovascular: Normal rate, regular rhythm, normal heart sounds and intact distal pulses.  Exam reveals no gallop and no friction rub.   No murmur heard. No carotid bruit  Pulmonary/Chest: Effort normal and breath sounds normal. No respiratory distress. He has no wheezes. He has no rales.  Abdominal: Soft. Bowel sounds are normal. He exhibits no distension and no mass. There is no tenderness.  Musculoskeletal: Normal range of motion. He exhibits no edema or tenderness.  Lymphadenopathy:    He has no cervical adenopathy.  Neurological: He is alert.  The patient has clear speech, he is  able to follow commands including strength in all 4 extremities and finger-nose-finger though he has some difficulty because of his dizziness. There is no weakness, no pronator drift, no cranial nerve abnormalities numbers 3 through 12. The patient is unable to stand, he is constantly falling especially to the left side.  Nystagmus is present with a constant fast movement of the right  Skin: Skin is warm and dry. No rash noted. No erythema.  Psychiatric: He has a normal mood and affect. His behavior is normal.  Nursing note and vitals reviewed.    ED Treatments / Results  Labs (all labs ordered are listed, but only abnormal results are displayed) Labs Reviewed - No data to display   Radiology No results found.  Procedures Procedures (including critical care time)  Medications Ordered in ED Medications - No data  to display  Initial Impression / Assessment and Plan / ED Course  I have reviewed the triage vital signs and the nursing notes.  Pertinent labs & imaging results that were available during my care of the patient were reviewed by me and considered in my medical decision making (see chart for details).  The patient will need a stroke workup for a possible posterior stroke however his earlier here symptoms suggest that this could be a peripheral etiology. I will give him a dose of Valium, Zofran while we are initiating a stroke workup.  CT scan shows acute ischemic stroke in the cerebellum on the right, this was discussed with the neurologist will see the patient in consultation, this was also discussed with the hospitalist, Dr. Denton Brick who will admit.  ED ECG REPORT  I personally interpreted this EKG   Date: 07/25/2017   Rate: 76  Rhythm: normal sinus rhythm  QRS Axis: normal  Intervals: normal  ST/T Wave abnormalities: normal  Conduction Disutrbances:none  Narrative Interpretation:   Old EKG Reviewed: none available  Final Clinical Impressions(s) / ED Diagnoses    Final diagnoses:  Cerebellar stroke [facility])   New Prescriptions New Prescriptions   No medications on file     Noemi Chapel, MD 07/25/17 2007

## 2017-07-25 NOTE — ED Notes (Signed)
Pt states he feels " a little better" not a dizzy as when he came in.

## 2017-07-26 ENCOUNTER — Encounter (HOSPITAL_COMMUNITY): Payer: Self-pay | Admitting: *Deleted

## 2017-07-26 ENCOUNTER — Observation Stay (HOSPITAL_BASED_OUTPATIENT_CLINIC_OR_DEPARTMENT_OTHER): Payer: Medicare Other

## 2017-07-26 ENCOUNTER — Observation Stay (HOSPITAL_COMMUNITY): Payer: Medicare Other

## 2017-07-26 DIAGNOSIS — I6340 Cerebral infarction due to embolism of unspecified cerebral artery: Secondary | ICD-10-CM | POA: Diagnosis not present

## 2017-07-26 DIAGNOSIS — I69393 Ataxia following cerebral infarction: Secondary | ICD-10-CM | POA: Diagnosis not present

## 2017-07-26 DIAGNOSIS — E039 Hypothyroidism, unspecified: Secondary | ICD-10-CM

## 2017-07-26 DIAGNOSIS — N182 Chronic kidney disease, stage 2 (mild): Secondary | ICD-10-CM | POA: Diagnosis present

## 2017-07-26 DIAGNOSIS — E1122 Type 2 diabetes mellitus with diabetic chronic kidney disease: Secondary | ICD-10-CM | POA: Diagnosis not present

## 2017-07-26 DIAGNOSIS — R270 Ataxia, unspecified: Secondary | ICD-10-CM | POA: Diagnosis present

## 2017-07-26 DIAGNOSIS — E8809 Other disorders of plasma-protein metabolism, not elsewhere classified: Secondary | ICD-10-CM | POA: Diagnosis not present

## 2017-07-26 DIAGNOSIS — R42 Dizziness and giddiness: Secondary | ICD-10-CM | POA: Diagnosis not present

## 2017-07-26 DIAGNOSIS — N401 Enlarged prostate with lower urinary tract symptoms: Secondary | ICD-10-CM | POA: Diagnosis not present

## 2017-07-26 DIAGNOSIS — I10 Essential (primary) hypertension: Secondary | ICD-10-CM | POA: Diagnosis not present

## 2017-07-26 DIAGNOSIS — I351 Nonrheumatic aortic (valve) insufficiency: Secondary | ICD-10-CM | POA: Diagnosis not present

## 2017-07-26 DIAGNOSIS — R2689 Other abnormalities of gait and mobility: Secondary | ICD-10-CM | POA: Diagnosis not present

## 2017-07-26 DIAGNOSIS — E119 Type 2 diabetes mellitus without complications: Secondary | ICD-10-CM | POA: Diagnosis not present

## 2017-07-26 DIAGNOSIS — Z7984 Long term (current) use of oral hypoglycemic drugs: Secondary | ICD-10-CM | POA: Diagnosis not present

## 2017-07-26 DIAGNOSIS — H9319 Tinnitus, unspecified ear: Secondary | ICD-10-CM | POA: Diagnosis present

## 2017-07-26 DIAGNOSIS — R233 Spontaneous ecchymoses: Secondary | ICD-10-CM | POA: Diagnosis present

## 2017-07-26 DIAGNOSIS — E1165 Type 2 diabetes mellitus with hyperglycemia: Secondary | ICD-10-CM | POA: Diagnosis present

## 2017-07-26 DIAGNOSIS — I129 Hypertensive chronic kidney disease with stage 1 through stage 4 chronic kidney disease, or unspecified chronic kidney disease: Secondary | ICD-10-CM | POA: Diagnosis present

## 2017-07-26 DIAGNOSIS — I4891 Unspecified atrial fibrillation: Secondary | ICD-10-CM | POA: Diagnosis not present

## 2017-07-26 DIAGNOSIS — I340 Nonrheumatic mitral (valve) insufficiency: Secondary | ICD-10-CM

## 2017-07-26 DIAGNOSIS — I638 Other cerebral infarction: Secondary | ICD-10-CM | POA: Diagnosis not present

## 2017-07-26 DIAGNOSIS — I639 Cerebral infarction, unspecified: Secondary | ICD-10-CM

## 2017-07-26 DIAGNOSIS — R350 Frequency of micturition: Secondary | ICD-10-CM

## 2017-07-26 DIAGNOSIS — E785 Hyperlipidemia, unspecified: Secondary | ICD-10-CM | POA: Diagnosis not present

## 2017-07-26 DIAGNOSIS — I480 Paroxysmal atrial fibrillation: Secondary | ICD-10-CM

## 2017-07-26 DIAGNOSIS — R7309 Other abnormal glucose: Secondary | ICD-10-CM | POA: Diagnosis not present

## 2017-07-26 DIAGNOSIS — Z8673 Personal history of transient ischemic attack (TIA), and cerebral infarction without residual deficits: Secondary | ICD-10-CM

## 2017-07-26 DIAGNOSIS — I493 Ventricular premature depolarization: Secondary | ICD-10-CM | POA: Diagnosis present

## 2017-07-26 DIAGNOSIS — Z7982 Long term (current) use of aspirin: Secondary | ICD-10-CM | POA: Diagnosis not present

## 2017-07-26 DIAGNOSIS — E46 Unspecified protein-calorie malnutrition: Secondary | ICD-10-CM | POA: Diagnosis not present

## 2017-07-26 DIAGNOSIS — Z833 Family history of diabetes mellitus: Secondary | ICD-10-CM | POA: Diagnosis not present

## 2017-07-26 DIAGNOSIS — E1159 Type 2 diabetes mellitus with other circulatory complications: Secondary | ICD-10-CM | POA: Diagnosis not present

## 2017-07-26 DIAGNOSIS — K5900 Constipation, unspecified: Secondary | ICD-10-CM | POA: Diagnosis not present

## 2017-07-26 DIAGNOSIS — D62 Acute posthemorrhagic anemia: Secondary | ICD-10-CM | POA: Diagnosis not present

## 2017-07-26 DIAGNOSIS — I6930 Unspecified sequelae of cerebral infarction: Secondary | ICD-10-CM

## 2017-07-26 DIAGNOSIS — N400 Enlarged prostate without lower urinary tract symptoms: Secondary | ICD-10-CM | POA: Diagnosis not present

## 2017-07-26 DIAGNOSIS — R29703 NIHSS score 3: Secondary | ICD-10-CM | POA: Diagnosis present

## 2017-07-26 DIAGNOSIS — Z79899 Other long term (current) drug therapy: Secondary | ICD-10-CM | POA: Diagnosis not present

## 2017-07-26 DIAGNOSIS — Z832 Family history of diseases of the blood and blood-forming organs and certain disorders involving the immune mechanism: Secondary | ICD-10-CM | POA: Diagnosis not present

## 2017-07-26 DIAGNOSIS — Z800 Family history of malignant neoplasm of digestive organs: Secondary | ICD-10-CM | POA: Diagnosis not present

## 2017-07-26 DIAGNOSIS — I69351 Hemiplegia and hemiparesis following cerebral infarction affecting right dominant side: Secondary | ICD-10-CM | POA: Diagnosis not present

## 2017-07-26 DIAGNOSIS — D472 Monoclonal gammopathy: Secondary | ICD-10-CM

## 2017-07-26 LAB — LIPID PANEL
CHOL/HDL RATIO: 2.9 ratio
Cholesterol: 147 mg/dL (ref 0–200)
HDL: 51 mg/dL (ref >40)
LDL CALC: 87 mg/dL (ref 0–99)
TRIGLYCERIDES: 45 mg/dL (ref <150)
VLDL: 9 mg/dL (ref 0–40)

## 2017-07-26 LAB — GLUCOSE, CAPILLARY
GLUCOSE-CAPILLARY: 186 mg/dL — AB (ref 65–99)
Glucose-Capillary: 303 mg/dL — ABNORMAL HIGH (ref 65–99)
Glucose-Capillary: 268 mg/dL — ABNORMAL HIGH (ref 65–99)

## 2017-07-26 LAB — HEMOGLOBIN A1C
Hgb A1c MFr Bld: 6.7 % — ABNORMAL HIGH (ref 4.8–5.6)
Mean Plasma Glucose: 146 mg/dL

## 2017-07-26 LAB — ECHOCARDIOGRAM COMPLETE
Height: 71.5 in
Weight: 2897.6 oz

## 2017-07-26 MED ORDER — ACETAMINOPHEN 325 MG PO TABS
650.0000 mg | ORAL_TABLET | ORAL | Status: DC | PRN
  Administered 2017-07-26 (×2): 650 mg via ORAL
  Administered 2017-07-27 (×2): 325 mg via ORAL
  Filled 2017-07-26 (×4): qty 2

## 2017-07-26 MED ORDER — ATORVASTATIN CALCIUM 40 MG PO TABS
40.0000 mg | ORAL_TABLET | Freq: Every day | ORAL | Status: DC
  Administered 2017-07-26 – 2017-07-28 (×3): 40 mg via ORAL
  Filled 2017-07-26 (×3): qty 1

## 2017-07-26 MED ORDER — ACETAMINOPHEN 160 MG/5ML PO SOLN: 650.0000 mg | ORAL | Status: DC | PRN

## 2017-07-26 MED ORDER — CLOPIDOGREL BISULFATE 75 MG PO TABS
75.0000 mg | ORAL_TABLET | Freq: Every day | ORAL | Status: DC
  Administered 2017-07-26 – 2017-07-28 (×3): 75 mg via ORAL
  Filled 2017-07-26 (×3): qty 1

## 2017-07-26 MED ORDER — HYDRALAZINE HCL 20 MG/ML IJ SOLN: 10.0000 mg | INTRAMUSCULAR | Status: DC | PRN

## 2017-07-26 MED ORDER — MECLIZINE HCL 25 MG PO TABS
25.0000 mg | ORAL_TABLET | Freq: Three times a day (TID) | ORAL | Status: DC
  Administered 2017-07-26 – 2017-07-28 (×8): 25 mg via ORAL
  Filled 2017-07-26 (×8): qty 1

## 2017-07-26 MED ORDER — SENNOSIDES-DOCUSATE SODIUM 8.6-50 MG PO TABS
1.0000 | ORAL_TABLET | Freq: Every evening | ORAL | Status: DC | PRN
  Administered 2017-07-27: 1 via ORAL
  Filled 2017-07-26: qty 1

## 2017-07-26 MED ORDER — STROKE: EARLY STAGES OF RECOVERY BOOK
Freq: Once | Status: AC
  Administered 2017-07-26: 01:00:00
  Filled 2017-07-26: qty 1

## 2017-07-26 MED ORDER — ENOXAPARIN SODIUM 40 MG/0.4ML ~~LOC~~ SOLN
40.0000 mg | SUBCUTANEOUS | Status: DC
  Administered 2017-07-26 – 2017-07-28 (×3): 40 mg via SUBCUTANEOUS
  Filled 2017-07-26 (×3): qty 0.4

## 2017-07-26 MED ORDER — ACETAMINOPHEN 650 MG RE SUPP: 650.0000 mg | RECTAL | Status: DC | PRN

## 2017-07-26 NOTE — Progress Notes (Signed)
Admitted to 5C16 via ED stretcher.  Reviewed plan of care, safety precautions, meds. & telemetry.  CCMD called & verified tele.  Denies pain, dizziness present, no N/V.   Spouse at bedside.

## 2017-07-26 NOTE — Progress Notes (Signed)
STROKE TEAM PROGRESS NOTE   HISTORY OF PRESENT ILLNESS (per record)  Earl [name] is a 76 y.o. male with a history of diabetes and hypertension who presented with nausea and dizziness. He has atrial fibrillation documented in his past medical history, but according to his most recent cardiology's note (2013) there was no evidence for this  Even after 21 day heart monitorand the patient states he was never diagnosed with it.  The pt takes 81 mg ASA daily.  He was in his normal state of health until the morning of 07/25/2017 around 4 AM when he noticed that he was very dizzy. He is also nauseated. When he was better this afternoon, he sought further care and had a CT scan which demonstrates a moderate sized cerebellar infarct.  Patient was not administered IV t-PA secondary to arriving outside of the tPA treatment window. He was admitted to General Neurology for further evaluation and treatment.   SUBJECTIVE (INTERVAL HISTORY) His wife is at the bedside.  The patient is awake, alert, and follows all commands appropriately.  Suspect etiology of stroke is atrial fibrillation without anticoagulation but AFIB not definitely demonstrated yet.  TEE and loop recorder ordered.   OBJECTIVE Temp:  [97.5 F (36.4 C)-98.4 F (36.9 C)] 98.4 F (36.9 C) (09/05 1741) Pulse Rate:  [61-78] 68 (09/05 1741) Cardiac Rhythm: Normal sinus rhythm (09/05 0700) Resp:  [17-28] 18 (09/05 1741) BP: (150-173)/(71-84) 155/77 (09/05 1741) SpO2:  [97 %-100 %] 100 % (09/05 1741) Weight:  [82.1 kg (181 lb 1.6 oz)] 82.1 kg (181 lb 1.6 oz) (09/05 0300)  CBC:   Recent Labs Lab 07/25/17 1720 07/25/17 1732  WBC 8.5  --   NEUTROABS 6.9  --   HGB 13.2 13.6  HCT 38.2* 40.0  MCV 83.2  --   PLT 198  --     Basic Metabolic Panel:   Recent Labs Lab 07/25/17 1720 07/25/17 1732  NA 139 139  K 4.2 4.2  CL 106 105  CO2 22  --   GLUCOSE 330* 331*  BUN 14 16  CREATININE 1.16 1.00  CALCIUM 9.0  --     Lipid  Panel:     Component Value Date/Time   CHOL 147 07/26/2017 0551   TRIG 45 07/26/2017 0551   HDL 51 07/26/2017 0551   CHOLHDL 2.9 07/26/2017 0551   VLDL 9 07/26/2017 0551   LDLCALC 87 07/26/2017 0551   HgbA1c:  Lab Results  Component Value Date   HGBA1C 6.6 (H) 04/14/2017   Urine Drug Screen:     Component Value Date/Time   LABOPIA NONE DETECTED 07/25/2017 1911   COCAINSCRNUR NONE DETECTED 07/25/2017 1911   LABBENZ NONE DETECTED 07/25/2017 1911   AMPHETMU NONE DETECTED 07/25/2017 1911   THCU NONE DETECTED 07/25/2017 1911   LABBARB NONE DETECTED 07/25/2017 1911    Alcohol Level     Component Value Date/Time   ETH <5 07/25/2017 1720    IMAGING  Ct Angio Head W Or Wo Contrast Ct Angio Neck W Or Wo Contrast 07/26/2017 IMPRESSION: 1. Patent carotid and vertebral arteries. No dissection, aneurysm, or hemodynamically significant stenosis utilizing NASCET criteria. 2. Patent circle of Willis. No large vessel occlusion, aneurysm, or significant stenosis. 3. Right AICA distribution late acute/early subacute infarction. 4. No hemorrhage or abnormal enhancement in the brain. 5. Lipoma within strap muscles anterior to larynx. 6. Large nodule in left lobe of thyroid, stable from prior thyroid ultrasounds.  Ct Head Wo Contrast 07/25/2017 IMPRESSION: 1. Acute  right cerebellar SCA territory infarct. 2. No intracranial hemorrhage.  Mr Brain Wo Contrast 07/26/2017 IMPRESSION: 1. Acute ischemic superior right cerebellar infarct without significant mass effect. Associated mild petechial hemorrhage without frank hemorrhagic transformation. 2. Otherwise normal brain MRI.  TTE 07/26/2017 Study Conclusions - Left ventricle: The cavity size was normal. Wall thickness was   increased in a pattern of mild LVH. Systolic function was normal.   The estimated ejection fraction was in the range of 55% to 60%.   Wall motion was normal; there were no regional wall motion   abnormalities. Features are  consistent with a pseudonormal left   ventricular filling pattern, with concomitant abnormal relaxation   and increased filling pressure (grade 2 diastolic dysfunction). - Aortic valve: There was mild regurgitation. - Mitral valve: There was mild regurgitation. - Right ventricle: The cavity size was mildly dilated. - Pulmonary arteries: Systolic pressure was mildly increased. PA   peak pressure: 39 mm Hg (S). Impressions: - Normal LV systolic function; moderate diastolic dysfunction; mild   LVH; mild AI; mild MR; mild RVE; mild TR with mildly elevated   pulmonary pressure.     PHYSICAL EXAM Pleasant elderly male not in distress. . Afebrile. Head is nontraumatic. Neck is supple without bruit.    Cardiac exam no murmur or gallop. Lungs are clear to auscultation. Distal pulses are well felt. Neurological Exam ;  Awake  Alert oriented x 3. Normal speech and language.eye movements full without nystagmus.fundi were not visualized. Vision acuity and fields appear normal. Hearing is normal. Palatal movements are normal. Face symmetric. Tongue midline. Normal strength, tone, reflexes and coordination. Normal sensation. Gait deferred.  ASSESSMENT/PLAN [name] is a 76 y.o. male with history of diabetes and hypertension who presented with nausea and dizziness. He did not receive IV t-PA due to arriving outside of the tPA treatment window.   Stroke:  Acute ischemic superior right cerebellar infarct with associated mild petechial hemorrhage, likely embolic, in the setting of questionable history of atrial fibrillation without anticoagulation.  Resultant  No deficits  CT head: no acute stroke  MRI head: acute ischemic superior right cerebellar infarct without significant mass effect. Associated mild petechial hemorrhage without frank hemorrhagic transformation.  MRA head: not performed  CTA head/neck: Right AICA distribution late acute/early subacute infarction  2D Echo: EF  55-60%. No source of embolus   LDL 87  HgbA1c 6.6  Lovenox 40 mg sq daily for VTE prophylaxis Diet heart healthy/carb modified Room service appropriate? Yes; Fluid consistency: Thin Diet NPO time specified  aspirin 81 mg daily prior to admission, now on aspirin 325 mg daily and clopidogrel 75 mg daily  Patient counseled to be compliant with his antithrombotic medications  Ongoing aggressive stroke risk factor management  Therapy recommendations:  CIR  Disposition:  pending  Hypertension  Stable  Permissive hypertension (OK if < 220/120) but gradually normalize in 5-7 days  Long-term BP goal normotensive  Hyperlipidemia  Home meds: simvastatin 40 mg PO daily, resumed in hospital  LDL 87, goal < 70  Stop simvastatin, add atorvastatin 40 mg PO daily  Continue atorvastatin at discharge  Diabetes  HgbA1c 6.6, goal < 7.0  Controlled  Other Stroke Risk Factors  Advanced age  Family hx sickle cell disease  ?atrial fibrillation without anticoagulation  Other Active Problems  None  Hospital day # 0  I have personally examined this patient, reviewed notes, independently viewed imaging studies, participated in medical decision making and plan of care.ROS completed by me  personally and pertinent positives fully documented  I have made any additions or clarifications directly to the above note. He presented with dizziness and nausea due to left superior cerebellar embolic infarct etiology to be determined but high possibility of PAF which has not yet been documented.He has silent left cerebellar and pareital MCA branch infarcts as well which are of remote age.Recommend TEE and loop recorder and ongoing stroke w/u. Greater than 50 % time during this 35 minute visit was spent on counselling and coordination of care about his embolic stroke and planning evaluation and treatment discussion Antony Contras, MD Medical Director [place] Pager:  [phone] 07/26/2017 8:07 PM   To contact Stroke Continuity provider, please refer to [email or web address] After hours, contact General Neurology

## 2017-07-26 NOTE — Care Management Note (Signed)
Case Management Note  Patient Details  Name: Earl [name] MRN: [id number] Date of Birth: [birth year]  Subjective/Objective:   Pt in with dizziness. He is from home with his spouse.                 Action/Plan: Awaiting PT/OT recommendations. CM following for d/c needs, physician orders.   Expected Discharge Date:  07/28/17               Expected Discharge Plan:     In-House Referral:     Discharge planning Services     Post Acute Care Choice:    Choice offered to:     DME Arranged:    DME Agency:     HH Arranged:    HH Agency:     Status of Service:  In process, will continue to follow  If discussed at Long Length of Stay Meetings, dates discussed:    Additional Comments:  Pollie Friar, RN 07/26/2017, 10:34 AM

## 2017-07-26 NOTE — Evaluation (Addendum)
Physical Therapy Evaluation Patient Details Name: Earl [name] MRN: [id number] DOB: August 10, 1941 Today's Date: 07/26/2017   History of Present Illness  76 y.o. male with medical history significant for type II DM, paroxysmal A. Fib, hypothyroidism, MGUS. Presented to ED wtih complaints of dizziness. Head CT without contrast- acute right cerebellar SCA territory infarct. Subsequent CT and her head and neck. Right AICA distribution late acute/early subacute infarction  Clinical Impression  Patient presents with decreased independence with mobility due to decreased balance, decreased deficit awareness, decreased coordination and ataxia. Currently needs min to mod A for mobility and previously was independent.  He will benefit from skilled PT in the acute setting to allow return home with family support following CIR level rehab stay.    Follow Up Recommendations CIR    Equipment Recommendations  Rolling walker with 5" wheels    Recommendations for Other Services Rehab consult     Precautions / Restrictions Precautions Precautions: Fall      Mobility  Bed Mobility Overal bed mobility: Needs Assistance Bed Mobility: Supine to Sit     Supine to sit: Min guard     General bed mobility comments: assist for balance  Transfers Overall transfer level: Needs assistance Equipment used: Rolling walker (2 wheeled) Transfers: Sit to/from Stand Sit to Stand: Min assist         General transfer comment: for balance, steadying assist, cues for hand placement, reviewed education on visual compensation for balance  Ambulation/Gait Ambulation/Gait assistance: Min assist;Mod assist Ambulation Distance (Feet): 22 Feet Assistive device: Rolling walker (2 wheeled) Gait Pattern/deviations: Step-through pattern;Decreased stride length;Shuffle;Ataxic;Wide base of support     General Gait Details: Ambulating with L and posterior bias, noted in mirror and able to correct with cues and visual  feedback; corrected head position three more times during short distance ambulation in room, cues also for walker use  Stairs            Wheelchair Mobility    Modified Rankin (Stroke Patients Only) Modified Rankin (Stroke Patients Only) Pre-Morbid Rankin Score: No symptoms Modified Rankin: Moderately severe disability     Balance Overall balance assessment: Needs assistance Sitting-balance support: Feet supported;No upper extremity supported Sitting balance-Leahy Scale: Fair Sitting balance - Comments: initially reliant on hands for balance and occasional cues for R lateral lean, but hands in lap for testing and no LOB Postural control: Left lateral lean Standing balance support: Bilateral upper extremity supported Standing balance-Leahy Scale: Poor Standing balance comment: UE support and cues/assist for balance in standing                             Pertinent Vitals/Pain Pain Assessment: No/denies pain    Home Living Family/patient expects to be discharged to:: Private residence Living Arrangements: Spouse/significant other;Children Available Help at Discharge: Family;Available 24 hours/day Type of Home: House Home Access: Stairs to enter Entrance Stairs-Rails: None Entrance Stairs-Number of Steps: 4 Home Layout: One level;Multi-level Home Equipment: None      Prior Function Level of Independence: Independent         Comments: drives; retired; 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; 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     Hand Dominance   Dominant Hand: Right    Extremity/Trunk Assessment   Upper Extremity Assessment RUE Deficits / Details: decreased coordination finger to nose with R compared to L    Lower Extremity Assessment Lower Extremity Assessment: RLE deficits/detail;LLE deficits/detail RLE Deficits / Details: AROM and strength WFL,  decreased coordination with heel to shin LLE Deficits / Details: WFL    Cervical / Trunk  Assessment Cervical / Trunk Assessment: Other exceptions Cervical / Trunk Exceptions: L lateral bias; trunkal ataxia  Communication   Communication: No difficulties  Cognition Arousal/Alertness: Awake/alert Behavior During Therapy: Flat affect Overall Cognitive Status: Within Functional Limits for tasks assessed                                 General Comments: internally distracted by symptoms      General Comments General comments (skin integrity, edema, etc.): daughter in room and asking questions about testing, answered and discussed rehab option.  Vestibular testing initiated.  See flowsheet for details.          Vestibular Assessment - 07/26/17 1510      Vestibular Assessment   General Observation Seated with eyes closed and limited verbalizations, but interactive when asking questions.  Reports symptoms of off balance, and daughter reports pt leans to L side.      Symptom Behavior   Type of Dizziness Imbalance   Frequency of Dizziness intermittent   Duration of Dizziness minutes    Aggravating Factors Turning head sideways;Turning head quickly;Activity in general   Relieving Factors Closing eyes;Head stationary     Occulomotor Exam   Occulomotor Alignment Normal   Spontaneous Absent   Gaze-induced Direction changing nystagmus   Smooth Pursuits Saccades   Saccades Slow;Poor trajectory     Vestibulo-Occular Reflex   VOR 1 Head Only (x 1 viewing) performed VOR x 10 head turns for vertical and horizontal movements with increased symptoms with horizontal movements up to 6-7/10   VOR Cancellation Normal         Exercises    Assessment/Plan    PT Assessment Patient needs continued PT services  PT Problem List Decreased mobility;Decreased activity tolerance;Decreased balance;Decreased knowledge of use of DME;Impaired sensation;Decreased coordination       PT Treatment Interventions DME instruction;Therapeutic activities;Gait training;Therapeutic  exercise;Patient/family education;Stair training;Balance training;Functional mobility training;Neuromuscular re-education;Other (comment) (vestibular rehab)    PT Goals (Current goals can be found in the Care Plan section)  Acute Rehab PT Goals Patient Stated Goal: to return to being independent PT Goal Formulation: With patient/family Time For Goal Achievement: 08/09/17 Potential to Achieve Goals: Good    Frequency Min 4X/week   Barriers to discharge        Co-evaluation               AM-PAC PT "6 Clicks" Daily Activity  Outcome Measure Difficulty turning over in bed (including adjusting bedclothes, sheets and blankets)?: A Little Difficulty moving from lying on back to sitting on the side of the bed? : A Lot Difficulty sitting down on and standing up from a chair with arms (e.g., wheelchair, bedside commode, etc,.)?: Unable Help needed moving to and from a bed to chair (including a wheelchair)?: A Little Help needed walking in hospital room?: A Little Help needed climbing 3-5 steps with a railing? : A Lot 6 Click Score: 14    End of Session Equipment Utilized During Treatment: Gait belt Activity Tolerance: Patient tolerated treatment well Patient left: in chair;with call bell/phone within reach;with family/visitor present;with chair alarm set   PT Visit Diagnosis: Unsteadiness on feet (R26.81);Other abnormalities of gait and mobility (R26.89);Dizziness and giddiness (R42)    Time: 9485-4627 PT Time Calculation (min) (ACUTE ONLY): 29 min   Charges:   PT  Evaluation $PT Eval Moderate Complexity: 1 Mod PT Treatments $Gait Training: 8-22 mins   PT G CodesMagda Kiel, [place] [phone] 07/26/2017   Reginia Naas 07/26/2017, 3:06 PM

## 2017-07-26 NOTE — Progress Notes (Signed)
  Echocardiogram 2D Echocardiogram has been performed.  Kassadie Pancake T Marshelle Bilger 07/26/2017, 4:25 PM

## 2017-07-26 NOTE — Progress Notes (Signed)
PT Cancellation Note  Patient Details Name: Earl [name] MRN: [id number] DOB: [birth year]   Cancelled Treatment:    Reason Eval/Treat Not Completed: Patient at procedure or test/unavailable; patient in MRI.  Will attempt later today.   Reginia Naas 07/26/2017, 11:37 AM  Magda Kiel, [place] 07/26/2017

## 2017-07-26 NOTE — Progress Notes (Signed)
Rehab Admissions Coordinator Note:  Patient was screened by Retta Diones for appropriateness for an Inpatient Acute Rehab Consult.  At this time, we are recommending Inpatient Rehab consult.  Retta Diones 07/26/2017, 12:39 PM  I can be reached at [phone].

## 2017-07-26 NOTE — Progress Notes (Signed)
I will begin insurance approval for a possible inpt rehab admission once medical workup is complete. I will follow up with pt and family tomorrow. [phone]

## 2017-07-26 NOTE — ED Notes (Signed)
Pt in CT.

## 2017-07-26 NOTE — Progress Notes (Signed)
Inpatient Diabetes Program Recommendations  AACE/ADA: New Consensus Statement on Inpatient Glycemic Control (2015)  Target Ranges:  Prepandial:   less than 140 mg/dL      Peak postprandial:   less than 180 mg/dL (1-2 hours)      Critically ill patients:  140 - 180 mg/dL   Lab Results  Component Value Date   GLUCAP 268 (H) 07/26/2017   HGBA1C 6.6 (H) 04/14/2017    Review of Glycemic Control  Diabetes history: DM 2 Outpatient Diabetes medications: Glipizide 5 mg tid, Metformin 500 mg 4x/day Current orders for Inpatient glycemic control: Novolog Sensitive 0-9 units tid  Inpatient Diabetes Program Recommendations:    Glucose elevated in the 200's. Consider increasing Novolog Correction to Moderate scale 0-15 units tid + Novolog HS scale.  A1c level in process. Last A1c 6.6% on 04/14/17  Thanks,  Tama Headings RN, MSN, [facility] Inpatient Diabetes Coordinator Team Pager [phone] (8a-5p)

## 2017-07-26 NOTE — H&P (Signed)
Physical Medicine and Rehabilitation Consult  Reason for Consult:  Stroke with dizziness, Nausea and vomiting with limitations in mobility and self care tasks.   Referring Physician: Dr. Ree Kida   HPI: Earl [name] is a 76 y.o. male with history of  HTN, questionable history PAF, T2DM; who was admitted on 07/25/17 with dizziness, nausea and right sided weakness. History taken from chart review and patient.  CT head reviewed, showing right cerebellar infarct. Per report, acute right cerebellar SCA infarct. CTA head/neck done revealing right AICA late acute/early subacute infarct, no vascular abnormality. Patient started on ASA for stroke prophylaxis and work up ongoing to help determine etiology. Therapy evaluations done revealing limitations in mobility and self care tasks. CIR recommended for follow up therapy.   Review of Systems  Constitutional: Negative for chills and fever.  HENT: Negative for hearing loss and tinnitus.   Eyes: Negative for blurred vision and double vision.  Respiratory: Negative for cough and hemoptysis.   Cardiovascular: Negative for chest pain, palpitations and leg swelling.  Gastrointestinal: Positive for nausea. Negative for constipation and vomiting.  Genitourinary: Negative for dysuria and urgency.  Musculoskeletal: Negative for back pain, joint pain, myalgias and neck pain.  Skin: Negative for itching and rash.  Neurological: Positive for dizziness. Negative for headaches.  Psychiatric/Behavioral: Negative for memory loss. The patient is not nervous/anxious.   All other systems reviewed and are negative.   Past Medical History:  Diagnosis Date  . Atrial fibrillation ([place])   . BPH (benign prostatic hyperplasia)   . Diabetes mellitus ([place])   . Hyperlipidemia   . Hypertension   . Other testicular hypofunction   . Type II or unspecified type diabetes mellitus without mention of complication, not stated as uncontrolled     Past Surgical  History:  Procedure Laterality Date  . INGUINAL HERNIA REPAIR     1968 and 2004    Family History  Problem Relation Age of Onset  . Sickle cell anemia Mother   . Diabetes Father   . Stomach cancer Father   . Colon cancer Neg Hx   . CAD Neg Hx     Social History:  Married. Retired--management in retail. Wife completed chemo a month ago--can provide supervision after discharge. Independent and active PTA. He reports that he has never smoked. He has never used smokeless tobacco. He reports that he does not drink alcohol or use drugs.    Allergies  Allergen Reactions  . Penicillins Other (See Comments)    Was told to "no take this" Has patient had a PCN reaction causing immediate rash, facial/tongue/throat swelling, SOB or lightheadedness with hypotension: Unknown Has patient had a PCN reaction causing severe rash involving mucus membranes or skin necrosis: Unknown Has patient had a PCN reaction that required hospitalization: Unknown Has patient had a PCN reaction occurring within the last 10 years: No If all of the above answers are "NO", then may proceed with Cephalosporin use.     Medications Prior to Admission  Medication Sig Dispense Refill  . aspirin 81 MG tablet Take 81 mg by mouth daily.    [name] atenolol (TENORMIN) 100 MG tablet TAKE 1 TABLET BY MOUTH  DAILY (Patient taking differently: Take 100 mg by mouth once a day) 90 tablet 1  . bisoprolol-hydrochlorothiazide (ZIAC) 10-6.25 MG tablet Take 1 tablet by mouth daily. 90 tablet 1  . Cholecalciferol (VITAMIN D PO) Take 5,000 Units by mouth daily.    . Cyanocobalamin (B-12) 50 MCG TABS  Take 50 mcg by mouth daily.    . finasteride (PROSCAR) 5 MG tablet TAKE 1 TABLET BY MOUTH  EVERY DAY FOR PROSTATE (Patient taking differently: Take 2.5 mg by mouth at bedtime) 90 tablet 1  . gabapentin (NEURONTIN) 300 MG capsule TAKE 1 TO 2 CAPSULES BY  MOUTH AT NIGHT FOR PAIN (Patient taking differently: Take 300-600 mg by mouth at bedtime for  pain) 180 capsule 1  . glipiZIDE (GLUCOTROL) 5 MG tablet TAKE 1 TABLET BY MOUTH 3  TIMES DAILY BEFORE MEALS  FOR DIABETES (Patient taking differently: Take 5 mg by mouth three times a day before meals for diabetes) 270 tablet 1  . levothyroxine (SYNTHROID, LEVOTHROID) 50 MCG tablet TAKE 1 TABLET BY MOUTH  DAILY BEFORE BREAKFAST (Patient taking differently: Take 25 mcg by mouth in the morning before breakfast) 90 tablet 1  . Magnesium 250 MG TABS Take 250 mg by mouth daily.    . metFORMIN (GLUCOPHAGE-XR) 500 MG 24 hr tablet TAKE 1 TABLET BY MOUTH 4  TIMES DAILY FOR DIABETES (Patient taking differently: Take 500 mg by mouth four times a day for diabetes) 360 tablet 1  . Multiple Vitamin (MULTIVITAMIN) capsule Take 1 capsule by mouth daily.      . potassium chloride (KLOR-CON) 10 MEQ CR tablet Take 10 mEq by mouth daily.      . quinapril (ACCUPRIL) 40 MG tablet TAKE 1 TABLET BY MOUTH  EVERY DAY FOR BLOOD  PRESSURE (Patient taking differently: Take 40 mg by mouth once a day for blood pressure) 90 tablet 1  . simvastatin (ZOCOR) 40 MG tablet TAKE 1 TABLET BY MOUTH AT  BEDTIME OR AS DIRECTED FOR  CHOLESTEROL (Patient taking differently: Take 20 mg by mouth in the evening on Mon/Wed/Fri) 90 tablet 1  . vitamin C (ASCORBIC ACID) 500 MG tablet Take 500 mg by mouth daily.        Home: Home Living Family/patient expects to be discharged to:: Private residence Living Arrangements: Spouse/significant other, Children Available Help at Discharge: Family, Available 24 hours/day Type of Home: House Home Access: Stairs to enter [employer] of Steps: 4 Entrance Stairs-Rails: None Home Layout: One level, Multi-level Alternate Level Stairs-Number of Steps: 2 Alternate Level Stairs-Rails: None Bathroom Shower/Tub: Tub/shower unit, Door [employer] Toilet: Standard Bathroom Accessibility: Yes Home Equipment: None  Functional History: Prior Function Level of Independence: Independent Comments: drives;  retired; 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 Transfers Sit to Stand: Mod assist Stand pivot transfers: Mod assist, +2 safety/equipment General transfer comment: midline oreintation displaced to L. Educated on fixating visually on target during mobility. Pt states this helps.       ADL: ADL Overall ADL's : Needs assistance/impaired Eating/Feeding: Set up Grooming: Sitting, Minimal assistance Grooming Details (indicate cue type and reason): Difficulty completing ADL unsupported - falls L Upper Body Bathing: Minimal assistance, Sitting Lower Body Bathing: Moderate assistance, Sit to/from stand Upper Body Dressing : Moderate assistance, Sitting Lower Body Dressing: Moderate assistance, Sit to/from stand Toilet Transfer: Moderate assistance, RW, Stand-pivot (+2 would be helpful) Toilet Transfer Details (indicate cue type and reason): simulated Toileting- Clothing Manipulation and Hygiene: Moderate assistance Toileting -  Clothing Manipulation Details (indicate cue type and reason): unable to release RW and manipulate clothing Functional mobility during ADLs: Moderate assistance, +2 for safety/equipment, Rolling walker, Cueing for safety, Cueing for sequencing General ADL Comments: Pt falls toward L when unsupported sitting EOB. motivated to complete ADL independently.  Cognition: Cognition Overall Cognitive Status: Within Functional Limits for tasks assessed Orientation Level: Oriented X4 Cognition Arousal/Alertness: Awake/alert Behavior During Therapy: Flat  affect Overall Cognitive Status: Within Functional Limits for tasks assessed General Comments: Will further assess cognition.   Blood pressure (!) 173/82, pulse 74, temperature 98.3 F (36.8 C), temperature source Oral, resp. rate 20, height 5' 11.5" (1.816 m), weight 82.1 kg (181 lb 1.6 oz), SpO2 97 %. Physical Exam  Nursing note and vitals reviewed. Constitutional: He is oriented to person, place, and time. He appears well-developed and well-nourished.  HENT:  Head: Normocephalic and atraumatic.  Eyes: Pupils are equal, round, and reactive to light. Conjunctivae and EOM are normal.  Neck: Normal range of motion. Neck supple.  Cardiovascular: Normal rate and regular rhythm.   Respiratory: Effort normal and breath sounds normal. No stridor. No respiratory distress. He has no wheezes.  GI: Soft. Bowel sounds are normal. He exhibits no distension. There is no tenderness.  Musculoskeletal: He exhibits no edema or tenderness.  Neurological: He is alert and oriented to person, place, and time.  Tends to keep eyes closed due to dizziness.  Horizontal nystagmus Left >right.  Speech clear.  Able to follow basic one and two step commands without difficulty.  LUE ataxia and dysmetria with finger to nose testing.  Motor: 5/5 throughout Sensation intact to light touch DTRs symmetric  Skin: Skin is warm and dry.  Psychiatric: He has a normal mood and affect. His behavior is normal. Judgment and thought content normal.    Results for orders placed or performed during the hospital encounter of 07/25/17 (from the past 24 hour(s))  CBG monitoring, ED     Status: Abnormal   Collection Time: 07/25/17  5:18 PM  Result Value Ref Range   Glucose-Capillary 310 (H) 65 - 99 mg/dL  Ethanol     Status: None   Collection Time: 07/25/17  5:20 PM  Result Value Ref Range   Alcohol, Ethyl (B) <5 <5 mg/dL  Protime-INR     Status: None   Collection Time: 07/25/17  5:20 PM  Result Value Ref Range    Prothrombin Time 13.0 11.4 - 15.2 seconds   INR 0.99   APTT     Status: Abnormal   Collection Time: 07/25/17  5:20 PM  Result Value Ref Range   aPTT 22 (L) 24 - 36 seconds  CBC     Status: Abnormal   Collection Time: 07/25/17  5:20 PM  Result Value Ref Range   WBC 8.5 4.0 - 10.5 K/uL   RBC 4.59 4.22 - 5.81 MIL/uL   Hemoglobin 13.2 13.0 - 17.0 g/dL   HCT 38.2 (L) 39.0 - 52.0 %   MCV 83.2 78.0 - 100.0 fL   MCH 28.8 26.0 - 34.0 pg   MCHC 34.6 30.0 - 36.0 g/dL   RDW 13.5 11.5 - 15.5 %   Platelets 198 150 - 400 K/uL  Differential     Status: None   Collection Time: 07/25/17  5:20 PM  Result Value Ref Range   Neutrophils Relative % 81 %   Neutro Abs 6.9 1.7 - 7.7 K/uL   Lymphocytes Relative 12 %   Lymphs Abs 1.0 0.7 -  4.0 K/uL   Monocytes Relative 7 %   Monocytes Absolute 0.6 0.1 - 1.0 K/uL   Eosinophils Relative 0 %   Eosinophils Absolute 0.0 0.0 - 0.7 K/uL   Basophils Relative 0 %   Basophils Absolute 0.0 0.0 - 0.1 K/uL  Comprehensive metabolic panel     Status: Abnormal   Collection Time: 07/25/17  5:20 PM  Result Value Ref Range   Sodium 139 135 - 145 mmol/L   Potassium 4.2 3.5 - 5.1 mmol/L   Chloride 106 101 - 111 mmol/L   CO2 22 22 - 32 mmol/L   Glucose, Bld 330 (H) 65 - 99 mg/dL   BUN 14 6 - 20 mg/dL   Creatinine, Ser 1.16 0.61 - 1.24 mg/dL   Calcium 9.0 8.9 - 10.3 mg/dL   Total Protein 6.2 (L) 6.5 - 8.1 g/dL   Albumin 3.7 3.5 - 5.0 g/dL   AST 24 15 - 41 U/L   ALT 19 17 - 63 U/L   Alkaline Phosphatase 65 38 - 126 U/L   Total Bilirubin 0.7 0.3 - 1.2 mg/dL   GFR calc non Af Amer 59 (L) >60 mL/min   GFR calc Af Amer >60 >60 mL/min   Anion gap 11 5 - 15  I-stat troponin, ED     Status: None   Collection Time: 07/25/17  5:30 PM  Result Value Ref Range   Troponin i, poc 0.03 0.00 - 0.08 ng/mL   Comment 3          I-Stat Chem 8, ED     Status: Abnormal   Collection Time: 07/25/17  5:32 PM  Result Value Ref Range   Sodium 139 135 - 145 mmol/L   Potassium 4.2 3.5 -  5.1 mmol/L   Chloride 105 101 - 111 mmol/L   BUN 16 6 - 20 mg/dL   Creatinine, Ser 1.00 0.61 - 1.24 mg/dL   Glucose, Bld 331 (H) 65 - 99 mg/dL   Calcium, Ion 1.07 (L) 1.15 - 1.40 mmol/L   TCO2 22 22 - 32 mmol/L   Hemoglobin 13.6 13.0 - 17.0 g/dL   HCT 40.0 39.0 - 52.0 %  Urine rapid drug screen (hosp performed)     Status: None   Collection Time: 07/25/17  7:11 PM  Result Value Ref Range   Opiates NONE DETECTED NONE DETECTED   Cocaine NONE DETECTED NONE DETECTED   Benzodiazepines NONE DETECTED NONE DETECTED   Amphetamines NONE DETECTED NONE DETECTED   Tetrahydrocannabinol NONE DETECTED NONE DETECTED   Barbiturates NONE DETECTED NONE DETECTED  Urinalysis, Routine w reflex microscopic     Status: Abnormal   Collection Time: 07/25/17  7:11 PM  Result Value Ref Range   Color, Urine YELLOW YELLOW   APPearance CLEAR CLEAR   Specific Gravity, Urine 1.014 1.005 - 1.030   pH 6.0 5.0 - 8.0   Glucose, UA >=500 (A) NEGATIVE mg/dL   Hgb urine dipstick NEGATIVE NEGATIVE   Bilirubin Urine NEGATIVE NEGATIVE   Ketones, ur 20 (A) NEGATIVE mg/dL   Protein, ur 30 (A) NEGATIVE mg/dL   Nitrite NEGATIVE NEGATIVE   Leukocytes, UA NEGATIVE NEGATIVE   RBC / HPF 0-5 0 - 5 RBC/hpf   WBC, UA 0-5 0 - 5 WBC/hpf   Bacteria, UA NONE SEEN NONE SEEN   Squamous Epithelial / LPF NONE SEEN NONE SEEN  Lipid panel     Status: None   Collection Time: 07/26/17  5:51 AM  Result Value Ref Range  Cholesterol 147 0 - 200 mg/dL   Triglycerides 45 <150 mg/dL   HDL 51 >40 mg/dL   Total CHOL/HDL Ratio 2.9 RATIO   VLDL 9 0 - 40 mg/dL   LDL Cholesterol 87 0 - 99 mg/dL  Glucose, capillary     Status: Abnormal   Collection Time: 07/26/17  6:58 AM  Result Value Ref Range   Glucose-Capillary 303 (H) 65 - 99 mg/dL  Glucose, capillary     Status: Abnormal   Collection Time: 07/26/17 12:54 PM  Result Value Ref Range   Glucose-Capillary 268 (H) 65 - 99 mg/dL   Ct Angio Head W Or Wo Contrast  Result Date:  07/26/2017 CLINICAL DATA:  76 y/o M; dizziness and nausea with vomiting. Evaluation for stroke. EXAM: CT ANGIOGRAPHY HEAD AND NECK TECHNIQUE: Multidetector CT imaging of the head and neck was performed using the standard protocol during bolus administration of intravenous contrast. Multiplanar CT image reconstructions and MIPs were obtained to evaluate the vascular anatomy. Carotid stenosis measurements (when applicable) are obtained utilizing NASCET criteria, using the distal internal carotid diameter as the denominator. CONTRAST:  50 cc Isovue 370 COMPARISON:  07/25/2017 CT head, 04/29/2013 thyroid ultrasound FINDINGS: CTA NECK FINDINGS Aortic arch: Standard branching. Imaged portion shows no evidence of aneurysm or dissection. No significant stenosis of the major arch vessel origins. Mild calcific atherosclerosis. Right carotid system: No evidence of dissection, stenosis (50% or greater) or occlusion. Left carotid system: No evidence of dissection, stenosis (50% or greater) or occlusion. Minimal calcific atherosclerosis of the carotid bifurcation. Vertebral arteries: Codominant. No evidence of dissection, stenosis (50% or greater) or occlusion. Skeleton: Mild cervical spondylosis with disc facet degenerative changes greatest in the upper cervical spine. L3-4 small central disc protrusion. No high-grade bony canal or foraminal stenosis. Other neck: Lipoma measuring 23 x 51 x 53 mm (AP x ML x CC series 6: Image 263, 8:102) within strap muscles anterior to larynx. Left lobe of thyroid nodule measuring 41 x 48 x 53 mm. Upper chest: Negative peer Review of the MIP images confirms the above findings CTA HEAD FINDINGS Anterior circulation: No significant stenosis, proximal occlusion, aneurysm, or vascular malformation. Mild non stenotic calcific atherosclerosis of carotid siphons. Posterior circulation: No significant stenosis, proximal occlusion, aneurysm, or vascular malformation. A diminutive right AICA branches  identified. Venous sinuses: As permitted by contrast timing, patent. Anatomic variants: Anterior and bilateral posterior communicating arteries are present. Large left A1, small right A1, and large anterior communicating artery, normal variant. Delayed phase: Right PICA distribution hypoattenuation with loss of gray-white differentiation compatible with late acute to subacute infarction. No abnormal enhancement of the brain. Review of the MIP images confirms the above findings IMPRESSION: 1. Patent carotid and vertebral arteries. No dissection, aneurysm, or hemodynamically significant stenosis utilizing NASCET criteria. 2. Patent circle of Willis. No large vessel occlusion, aneurysm, or significant stenosis. 3. Right AICA distribution late acute/early subacute infarction. 4. No hemorrhage or abnormal enhancement in the brain. 5. Lipoma within strap muscles anterior to larynx. 6. Large nodule in left lobe of thyroid, stable from prior thyroid ultrasounds. These results were called by telephone at the time of interpretation on 07/26/2017 at 1:11 am to Dr. Roland Rack , who verbally acknowledged these results. Electronically Signed   By: Kristine Garbe M.D.   On: 07/26/2017 01:14   Ct Head Wo Contrast  Result Date: 07/25/2017 CLINICAL DATA:  Weakness and ataxia. EXAM: CT HEAD WITHOUT CONTRAST TECHNIQUE: Contiguous axial images were obtained from the base of  the skull through the vertex without intravenous contrast. COMPARISON:  None. FINDINGS: Brain: There is a moderate-sized, wedge-shaped region of edema in the right cerebellum consistent with acute SCA territory infarct. No intracranial hemorrhage, mass, midline shift, or extra-axial fluid collection is identified. Mild cerebral atrophy is within normal limits for age. Vascular: Carotid siphon atherosclerosis.  No hyperdense vessel. Skull: No fracture focal osseous lesion. Sinuses/Orbits: Visualized paranasal sinuses and mastoid air cells are  clear. Bilateral cataract extraction is noted. Other: None. IMPRESSION: 1. Acute right cerebellar SCA territory infarct. 2. No intracranial hemorrhage. Electronically Signed   By: Logan Bores M.D.   On: 07/25/2017 17:53   Ct Angio Neck W Or Wo Contrast  Result Date: 07/26/2017 CLINICAL DATA:  76 y/o M; dizziness and nausea with vomiting. Evaluation for stroke. EXAM: CT ANGIOGRAPHY HEAD AND NECK TECHNIQUE: Multidetector CT imaging of the head and neck was performed using the standard protocol during bolus administration of intravenous contrast. Multiplanar CT image reconstructions and MIPs were obtained to evaluate the vascular anatomy. Carotid stenosis measurements (when applicable) are obtained utilizing NASCET criteria, using the distal internal carotid diameter as the denominator. CONTRAST:  50 cc Isovue 370 COMPARISON:  07/25/2017 CT head, 04/29/2013 thyroid ultrasound FINDINGS: CTA NECK FINDINGS Aortic arch: Standard branching. Imaged portion shows no evidence of aneurysm or dissection. No significant stenosis of the major arch vessel origins. Mild calcific atherosclerosis. Right carotid system: No evidence of dissection, stenosis (50% or greater) or occlusion. Left carotid system: No evidence of dissection, stenosis (50% or greater) or occlusion. Minimal calcific atherosclerosis of the carotid bifurcation. Vertebral arteries: Codominant. No evidence of dissection, stenosis (50% or greater) or occlusion. Skeleton: Mild cervical spondylosis with disc facet degenerative changes greatest in the upper cervical spine. L3-4 small central disc protrusion. No high-grade bony canal or foraminal stenosis. Other neck: Lipoma measuring 23 x 51 x 53 mm (AP x ML x CC series 6: Image 263, 8:102) within strap muscles anterior to larynx. Left lobe of thyroid nodule measuring 41 x 48 x 53 mm. Upper chest: Negative peer Review of the MIP images confirms the above findings CTA HEAD FINDINGS Anterior circulation: No significant  stenosis, proximal occlusion, aneurysm, or vascular malformation. Mild non stenotic calcific atherosclerosis of carotid siphons. Posterior circulation: No significant stenosis, proximal occlusion, aneurysm, or vascular malformation. A diminutive right AICA branches identified. Venous sinuses: As permitted by contrast timing, patent. Anatomic variants: Anterior and bilateral posterior communicating arteries are present. Large left A1, small right A1, and large anterior communicating artery, normal variant. Delayed phase: Right PICA distribution hypoattenuation with loss of gray-white differentiation compatible with late acute to subacute infarction. No abnormal enhancement of the brain. Review of the MIP images confirms the above findings IMPRESSION: 1. Patent carotid and vertebral arteries. No dissection, aneurysm, or hemodynamically significant stenosis utilizing NASCET criteria. 2. Patent circle of Willis. No large vessel occlusion, aneurysm, or significant stenosis. 3. Right AICA distribution late acute/early subacute infarction. 4. No hemorrhage or abnormal enhancement in the brain. 5. Lipoma within strap muscles anterior to larynx. 6. Large nodule in left lobe of thyroid, stable from prior thyroid ultrasounds. These results were called by telephone at the time of interpretation on 07/26/2017 at 1:11 am to Dr. Roland Rack , who verbally acknowledged these results. Electronically Signed   By: Kristine Garbe M.D.   On: 07/26/2017 01:14   Mr Brain Wo Contrast  Result Date: 07/26/2017 CLINICAL DATA:  Initial evaluation for acute stroke. EXAM: MRI HEAD WITHOUT CONTRAST TECHNIQUE: Multiplanar,  multiecho pulse sequences of the brain and surrounding structures were obtained without intravenous contrast. COMPARISON:  Comparison made with prior CTs from 07/25/2017 as well as 07/26/2017. FINDINGS: Brain: Age-appropriate cerebral atrophy. No significant cerebral white matter for age. Confluent restricted  diffusion involving the superior right cerebellar hemisphere, compatible with acute ischemic infarct, right superior cerebral artery territory. No significant mass effect. Scattered petechial hemorrhage without evidence for frank hemorrhagic transformation (series 9, image 24). No other acute infarct. Gray-white matter differentiation otherwise maintained. No other evidence for acute or chronic intracranial hemorrhage. No mass lesion, midline shift or mass effect. No hydrocephalus. No extra-axial fluid collection. Vascular: Major intracranial vascular flow voids are maintained. Skull and upper cervical spine: Craniocervical junction normal. Visualized upper cervical spine unremarkable. Bone marrow signal intensity within normal limits. No scalp soft tissue abnormality. Sinuses/Orbits: Globes and orbital soft tissues within normal limits. Patient status post lens extraction bilaterally. Paranasal sinuses are clear. Trace right mastoid effusion noted. Inner ear structures normal. Other: None. IMPRESSION: 1. Acute ischemic superior right cerebellar infarct without significant mass effect. Associated mild petechial hemorrhage without frank hemorrhagic transformation. 2. Otherwise normal brain MRI. Electronically Signed   By: Jeannine Boga M.D.   On: 07/26/2017 12:57   Assessment/Plan: Diagnosis: Right cerebellar infarct Labs and images independently reviewed.  Records reviewed and summated above. Stroke: Continue secondary stroke prophylaxis and Risk Factor Modification listed below:   Antiplatelet therapy:   Blood Pressure Management:  Continue current medication with prn's with permisive HTN per primary team Statin Agent:   Diabetes management:    1. Does the need for close, 24 hr/day medical supervision in concert with the patient's rehab needs make it unreasonable for this patient to be served in a less intensive setting? Yes  2. Co-Morbidities requiring supervision/potential complications: HTN  (monitor and provide prns in accordance with increased physical exertion and pain), questionable history PAF (monitor HR with increased physical acitivty), T2 DM (Monitor in accordance with exercise and adjust meds as necessary), BPH (monitor), CKD (avoid nephrotoxic meds) 3. Due to safety, disease management and patient education, does the patient require 24 hr/day rehab nursing? Yes 4. Does the patient require coordinated care of a physician, rehab nurse, PT (1-2 hrs/day, 5 days/week) and OT (1-2 hrs/day, 5 days/week) to address physical and functional deficits in the context of the above medical diagnosis(es)? Yes Addressing deficits in the following areas: balance, endurance, locomotion, transferring, bathing, dressing, toileting and psychosocial support 5. Can the patient actively participate in an intensive therapy program of at least 3 hrs of therapy per day at least 5 days per week? Yes 6. The potential for patient to make measurable gains while on inpatient rehab is excellent 7. Anticipated functional outcomes upon discharge from inpatient rehab are supervision  with PT, supervision with OT, n/a with SLP. 8. Estimated rehab length of stay to reach the above functional goals is: 8-13 days. 9. Anticipated D/C setting: Home 10. Anticipated post D/C treatments: HH therapy and Home excercise program 11. Overall Rehab/Functional Prognosis: good  RECOMMENDATIONS: This patient's condition is appropriate for continued rehabilitative care in the following setting: CIR Patient has agreed to participate in recommended program. Yes Note that insurance prior authorization may be required for reimbursement for recommended care.  Comment: Rehab Admissions Coordinator to follow up.  Delice Lesch, MD, [place], [place] 07/26/2017

## 2017-07-26 NOTE — Progress Notes (Signed)
PROGRESS NOTE    Earl [name]  WUJ:[id number] DOB: September 11, 1941 DOA: 07/25/2017 PCP: [name], MD   Chief Complaint  Patient presents with  . Nausea  . Otalgia  . Dizziness    Brief Narrative:  HPI on 07/25/2017 by Dr. Jenetta Downer Kharon Hixon is a 76 y.o. male with medical history significant for type II DM, paroxysmal A. Fib, hypothyroidism, MGUS, who presented to the ED today with complaints of ear fullness bilaterally, with drinking and subsequently dizziness that started about 4 AM this morning presents while lying down, severe enough to prevent him from walking or getting up. It is associated vomiting- 5 times today, nonbloody, consistent recently ingested meals. No facial asymmetry, no vision change, slurred speech, no extremity weakness. Patient endorses recent good PO intake, no diarrhea, no black stools. Patient has been compliant with all his medications- which includes 4 antihypertensives, including aspirin 81mg  daily for prophylaxis. Patient denies tobacco abuse history, alcohol use or illicit drug use. Assessment & Plan   Acute CVA -CT head showed acute right cerebellar SCA territory infarct -CTA head and neck showed patent carotid and vertebral arteries, no dissection or aneurysm. Patent circle of Willis. Right AICA distribution late acute/early subacute infarction. -MRI brain showed acute ischemic superior right cerebellar infarct without mass effect. Associated mild petechial hemorrhage without frank hemorrhagic transformation. -Neurology consulted and appreciated -Echocardiogram pending -Hemoglobin A1c pending -LDL 87 -PT consulted, pending -OT consulted, recommended CIR -Continue aspirin, statin  Dizziness -Likely secondary to the above -Will consult PT, vestibular -orthostatic vitals unremarkable -Continue meclizine  Essential hypertension -Allow for permissive hypertension given its use CVA -Atenolol, Ziac, quinapril held  Diabetes  Mellitus, type II -Hemoglobin A1c pending -Glipizide held -Continue insulin place until CBG monitoring  History of MGUS -Follows with Dr. Alen Blew  Transient episode of atrial fibrillation -Occurred in 2013 after colonoscopy -Patient was finally by Dr. Angelena Form, cardiology. Patient did have an event monitor showed sinus rhythm with frequent PACs, rare PVCs. Patient will start on aspirin 325 mg daily  Hypothyroidism -Continue Synthroid  Hyperlipidemia -Patient uses simvastatin at home however taking half a tablet 3 times per week -Lipid panel showed total cholesterol 147, HDL 51, LDL 87, triglycerides 45 -Continue statin  DVT Prophylaxis  Lovenox  Code Status: Full  Family Communication: Family at bedside  Disposition Plan: Currently in observation, pending workup.  Consultants Neurology  Procedures  None  Antibiotics   Anti-infectives    None      Subjective:   Earl [name] seen and examined today.  Continues to complain of headache. Feels tired. Denies chest pain, shortness breath, abdominal pain, nausea or vomiting, diarrhea or constipation.   Objective:   Vitals:   07/26/17 0100 07/26/17 0300 07/26/17 0500 07/26/17 0700  BP: (!) 156/72 (!) 156/84 (!) 158/80 (!) 173/82  Pulse: 72 72 78 74  Resp: 20 20 (!) 22 20  Temp: (!) 97.5 F (36.4 C) 98.2 F (36.8 C) 98.2 F (36.8 C) 98.3 F (36.8 C)  TempSrc: Oral Oral Oral Oral  SpO2: 99% 99% 98% 97%  Weight:  82.1 kg (181 lb 1.6 oz)    Height:  5' 11.5" (1.816 m)      Intake/Output Summary (Last 24 hours) at 07/26/17 1301 Last data filed at 07/26/17 0540  Gross per 24 hour  Intake                0 ml  Output  600 ml  Net             -600 ml   Filed Weights   07/26/17 0300  Weight: 82.1 kg (181 lb 1.6 oz)   Exam  General: Well developed, well nourished, NAD, appears stated age  HEENT: NCAT, mucous membranes moist.   Cardiovascular: S1 S2 auscultated, no rubs, murmurs or gallops.  Regular rate and rhythm.  Respiratory: Clear to auscultation bilaterally with equal chest rise  Abdomen: Soft, nontender, nondistended, + bowel sounds  Extremities: warm dry without cyanosis clubbing or edema  Neuro: AAOx3, nonfocal  Psych: Normal affect and demeanor with intact judgement and insight  Data Reviewed: I have personally reviewed following labs and imaging studies  CBC:  Recent Labs Lab 07/25/17 1720 07/25/17 1732  WBC 8.5  --   NEUTROABS 6.9  --   HGB 13.2 13.6  HCT 38.2* 40.0  MCV 83.2  --   PLT 198  --    Basic Metabolic Panel:  Recent Labs Lab 07/25/17 1720 07/25/17 1732  NA 139 139  K 4.2 4.2  CL 106 105  CO2 22  --   GLUCOSE 330* 331*  BUN 14 16  CREATININE 1.16 1.00  CALCIUM 9.0  --    GFR: Estimated Creatinine Clearance: 68 mL/min (by C-G formula based on SCr of 1 mg/dL). Liver Function Tests:  Recent Labs Lab 07/25/17 1720  AST 24  ALT 19  ALKPHOS 65  BILITOT 0.7  PROT 6.2*  ALBUMIN 3.7   No results for input(s): LIPASE, AMYLASE in the last 168 hours. No results for input(s): AMMONIA in the last 168 hours. Coagulation Profile:  Recent Labs Lab 07/25/17 1720  INR 0.99   Cardiac Enzymes: No results for input(s): CKTOTAL, CKMB, CKMBINDEX, TROPONINI in the last 168 hours. BNP (last 3 results) No results for input(s): PROBNP in the last 8760 hours. HbA1C: No results for input(s): HGBA1C in the last 72 hours. CBG:  Recent Labs Lab 07/25/17 1718 07/26/17 0658 07/26/17 1254  GLUCAP 310* 303* 268*   Lipid Profile:  Recent Labs  07/26/17 0551  CHOL 147  HDL 51  LDLCALC 87  TRIG 45  CHOLHDL 2.9   Thyroid Function Tests: No results for input(s): TSH, T4TOTAL, FREET4, T3FREE, THYROIDAB in the last 72 hours. Anemia Panel: No results for input(s): VITAMINB12, FOLATE, FERRITIN, TIBC, IRON, RETICCTPCT in the last 72 hours. Urine analysis:    Component Value Date/Time   COLORURINE YELLOW 07/25/2017 1911    APPEARANCEUR CLEAR 07/25/2017 1911   LABSPEC 1.014 07/25/2017 1911   PHURINE 6.0 07/25/2017 1911   GLUCOSEU >=500 (A) 07/25/2017 1911   HGBUR NEGATIVE 07/25/2017 1911   BILIRUBINUR NEGATIVE 07/25/2017 1911   KETONESUR 20 (A) 07/25/2017 1911   PROTEINUR 30 (A) 07/25/2017 1911   UROBILINOGEN 0.2 05/05/2015 1443   NITRITE NEGATIVE 07/25/2017 1911   LEUKOCYTESUR NEGATIVE 07/25/2017 1911   Sepsis Labs: @LABRCNTIP (procalcitonin:4,lacticidven:4)  )No results found for this or any previous visit (from the past 240 hour(s)).    Radiology Studies: Ct Angio Head W Or Wo Contrast  Result Date: 07/26/2017 CLINICAL DATA:  76 y/o M; dizziness and nausea with vomiting. Evaluation for stroke. EXAM: CT ANGIOGRAPHY HEAD AND NECK TECHNIQUE: Multidetector CT imaging of the head and neck was performed using the standard protocol during bolus administration of intravenous contrast. Multiplanar CT image reconstructions and MIPs were obtained to evaluate the vascular anatomy. Carotid stenosis measurements (when applicable) are obtained utilizing NASCET criteria, using the distal internal carotid  diameter as the denominator. CONTRAST:  50 cc Isovue 370 COMPARISON:  07/25/2017 CT head, 04/29/2013 thyroid ultrasound FINDINGS: CTA NECK FINDINGS Aortic arch: Standard branching. Imaged portion shows no evidence of aneurysm or dissection. No significant stenosis of the major arch vessel origins. Mild calcific atherosclerosis. Right carotid system: No evidence of dissection, stenosis (50% or greater) or occlusion. Left carotid system: No evidence of dissection, stenosis (50% or greater) or occlusion. Minimal calcific atherosclerosis of the carotid bifurcation. Vertebral arteries: Codominant. No evidence of dissection, stenosis (50% or greater) or occlusion. Skeleton: Mild cervical spondylosis with disc facet degenerative changes greatest in the upper cervical spine. L3-4 small central disc protrusion. No high-grade bony canal or  foraminal stenosis. Other neck: Lipoma measuring 23 x 51 x 53 mm (AP x ML x CC series 6: Image 263, 8:102) within strap muscles anterior to larynx. Left lobe of thyroid nodule measuring 41 x 48 x 53 mm. Upper chest: Negative peer Review of the MIP images confirms the above findings CTA HEAD FINDINGS Anterior circulation: No significant stenosis, proximal occlusion, aneurysm, or vascular malformation. Mild non stenotic calcific atherosclerosis of carotid siphons. Posterior circulation: No significant stenosis, proximal occlusion, aneurysm, or vascular malformation. A diminutive right AICA branches identified. Venous sinuses: As permitted by contrast timing, patent. Anatomic variants: Anterior and bilateral posterior communicating arteries are present. Large left A1, small right A1, and large anterior communicating artery, normal variant. Delayed phase: Right PICA distribution hypoattenuation with loss of gray-white differentiation compatible with late acute to subacute infarction. No abnormal enhancement of the brain. Review of the MIP images confirms the above findings IMPRESSION: 1. Patent carotid and vertebral arteries. No dissection, aneurysm, or hemodynamically significant stenosis utilizing NASCET criteria. 2. Patent circle of Willis. No large vessel occlusion, aneurysm, or significant stenosis. 3. Right AICA distribution late acute/early subacute infarction. 4. No hemorrhage or abnormal enhancement in the brain. 5. Lipoma within strap muscles anterior to larynx. 6. Large nodule in left lobe of thyroid, stable from prior thyroid ultrasounds. These results were called by telephone at the time of interpretation on 07/26/2017 at 1:11 am to Dr. Roland Rack , who verbally acknowledged these results. Electronically Signed   By: Kristine Garbe M.D.   On: 07/26/2017 01:14   Ct Head Wo Contrast  Result Date: 07/25/2017 CLINICAL DATA:  Weakness and ataxia. EXAM: CT HEAD WITHOUT CONTRAST TECHNIQUE:  Contiguous axial images were obtained from the base of the skull through the vertex without intravenous contrast. COMPARISON:  None. FINDINGS: Brain: There is a moderate-sized, wedge-shaped region of edema in the right cerebellum consistent with acute SCA territory infarct. No intracranial hemorrhage, mass, midline shift, or extra-axial fluid collection is identified. Mild cerebral atrophy is within normal limits for age. Vascular: Carotid siphon atherosclerosis.  No hyperdense vessel. Skull: No fracture focal osseous lesion. Sinuses/Orbits: Visualized paranasal sinuses and mastoid air cells are clear. Bilateral cataract extraction is noted. Other: None. IMPRESSION: 1. Acute right cerebellar SCA territory infarct. 2. No intracranial hemorrhage. Electronically Signed   By: Logan Bores M.D.   On: 07/25/2017 17:53   Ct Angio Neck W Or Wo Contrast  Result Date: 07/26/2017 CLINICAL DATA:  76 y/o M; dizziness and nausea with vomiting. Evaluation for stroke. EXAM: CT ANGIOGRAPHY HEAD AND NECK TECHNIQUE: Multidetector CT imaging of the head and neck was performed using the standard protocol during bolus administration of intravenous contrast. Multiplanar CT image reconstructions and MIPs were obtained to evaluate the vascular anatomy. Carotid stenosis measurements (when applicable) are obtained utilizing NASCET  criteria, using the distal internal carotid diameter as the denominator. CONTRAST:  50 cc Isovue 370 COMPARISON:  07/25/2017 CT head, 04/29/2013 thyroid ultrasound FINDINGS: CTA NECK FINDINGS Aortic arch: Standard branching. Imaged portion shows no evidence of aneurysm or dissection. No significant stenosis of the major arch vessel origins. Mild calcific atherosclerosis. Right carotid system: No evidence of dissection, stenosis (50% or greater) or occlusion. Left carotid system: No evidence of dissection, stenosis (50% or greater) or occlusion. Minimal calcific atherosclerosis of the carotid bifurcation.  Vertebral arteries: Codominant. No evidence of dissection, stenosis (50% or greater) or occlusion. Skeleton: Mild cervical spondylosis with disc facet degenerative changes greatest in the upper cervical spine. L3-4 small central disc protrusion. No high-grade bony canal or foraminal stenosis. Other neck: Lipoma measuring 23 x 51 x 53 mm (AP x ML x CC series 6: Image 263, 8:102) within strap muscles anterior to larynx. Left lobe of thyroid nodule measuring 41 x 48 x 53 mm. Upper chest: Negative peer Review of the MIP images confirms the above findings CTA HEAD FINDINGS Anterior circulation: No significant stenosis, proximal occlusion, aneurysm, or vascular malformation. Mild non stenotic calcific atherosclerosis of carotid siphons. Posterior circulation: No significant stenosis, proximal occlusion, aneurysm, or vascular malformation. A diminutive right AICA branches identified. Venous sinuses: As permitted by contrast timing, patent. Anatomic variants: Anterior and bilateral posterior communicating arteries are present. Large left A1, small right A1, and large anterior communicating artery, normal variant. Delayed phase: Right PICA distribution hypoattenuation with loss of gray-white differentiation compatible with late acute to subacute infarction. No abnormal enhancement of the brain. Review of the MIP images confirms the above findings IMPRESSION: 1. Patent carotid and vertebral arteries. No dissection, aneurysm, or hemodynamically significant stenosis utilizing NASCET criteria. 2. Patent circle of Willis. No large vessel occlusion, aneurysm, or significant stenosis. 3. Right AICA distribution late acute/early subacute infarction. 4. No hemorrhage or abnormal enhancement in the brain. 5. Lipoma within strap muscles anterior to larynx. 6. Large nodule in left lobe of thyroid, stable from prior thyroid ultrasounds. These results were called by telephone at the time of interpretation on 07/26/2017 at 1:11 am to Dr.  Roland Rack , who verbally acknowledged these results. Electronically Signed   By: Kristine Garbe M.D.   On: 07/26/2017 01:14   Mr Brain Wo Contrast  Result Date: 07/26/2017 CLINICAL DATA:  Initial evaluation for acute stroke. EXAM: MRI HEAD WITHOUT CONTRAST TECHNIQUE: Multiplanar, multiecho pulse sequences of the brain and surrounding structures were obtained without intravenous contrast. COMPARISON:  Comparison made with prior CTs from 07/25/2017 as well as 07/26/2017. FINDINGS: Brain: Age-appropriate cerebral atrophy. No significant cerebral white matter for age. Confluent restricted diffusion involving the superior right cerebellar hemisphere, compatible with acute ischemic infarct, right superior cerebral artery territory. No significant mass effect. Scattered petechial hemorrhage without evidence for frank hemorrhagic transformation (series 9, image 24). No other acute infarct. Gray-white matter differentiation otherwise maintained. No other evidence for acute or chronic intracranial hemorrhage. No mass lesion, midline shift or mass effect. No hydrocephalus. No extra-axial fluid collection. Vascular: Major intracranial vascular flow voids are maintained. Skull and upper cervical spine: Craniocervical junction normal. Visualized upper cervical spine unremarkable. Bone marrow signal intensity within normal limits. No scalp soft tissue abnormality. Sinuses/Orbits: Globes and orbital soft tissues within normal limits. Patient status post lens extraction bilaterally. Paranasal sinuses are clear. Trace right mastoid effusion noted. Inner ear structures normal. Other: None. IMPRESSION: 1. Acute ischemic superior right cerebellar infarct without significant mass effect. Associated mild petechial  hemorrhage without frank hemorrhagic transformation. 2. Otherwise normal brain MRI. Electronically Signed   By: Jeannine Boga M.D.   On: 07/26/2017 12:57     Scheduled Meds: . aspirin EC  81 mg  Oral Daily  . atorvastatin  40 mg Oral q1800  . enoxaparin (LOVENOX) injection  40 mg Subcutaneous Q24H  . finasteride  5 mg Oral Daily  . gabapentin  300 mg Oral BID  . insulin aspart  0-9 Units Subcutaneous TID WC  . levothyroxine  25 mcg Oral QAC breakfast  . Magnesium  200 mg Oral Daily  . meclizine  25 mg Oral TID  . potassium chloride  10 mEq Oral Daily   Continuous Infusions:   LOS: 0 days   Time Spent in minutes   30 minutes  Jonathyn Carothers D.O. on 07/26/2017 at 1:01 PM  Between 7am to 7pm - Pager - [phone]  After 7pm go to [email or web address] - password [email or web address]  And look for the night coverage person covering for me after hours  [name] Office  [phone]

## 2017-07-26 NOTE — Progress Notes (Signed)
Occupational Therapy Evaluation Patient Details Name: Earl [name] MRN: [id number] DOB: October 01, 1941 Today's Date: 07/26/2017    History of Present Illness 76 y.o. male with medical history significant for type II DM, paroxysmal A. Fib, hypothyroidism, MGUS. Presented to ED wtih complaints of dizziness. Head CT without contrast- acute right cerebellar SCA territory infarct. Subsequent CT and her head and neck. Right AICA distribution late acute/early subacute infarction   Clinical Impression   PTA, pt was independent with mobility and ADL, drove and volunteered with [employer] and with other organizations. Pt presents with significant functional decline, requiring Mod A +2 for safe mobility @ RW level and mod A with ADL due to deficits listed below. Feel pt is an excellent CIR candidate to maximize his functional level of independence and facilitate a safe return home. Pt very motivated. Began educating pt on visual fixation during mobility to compensate for his dizziness during movement. Will follow acutely to address establish goals and facilitate DC to next venue of care.     Follow Up Recommendations  CIR;Supervision/Assistance - 24 hour    Equipment Recommendations  3 in 1 bedside commode    Recommendations for Other Services Rehab consult     Precautions / Restrictions Precautions Precautions: Fall      Mobility Bed Mobility Overal bed mobility: Needs Assistance Bed Mobility: Sit to Supine;Supine to Sit     Supine to sit: Min assist Sit to supine: Min assist   General bed mobility comments: Initially falling L and posteriorly upon sitting  Transfers Overall transfer level: Needs assistance Equipment used: Rolling walker (2 wheeled) Transfers: Sit to/from [facility] Sit to Stand: Mod assist Stand pivot transfers: Mod assist;+2 safety/equipment       General transfer comment: midline oreintation displaced to L. Educated on fixating visually on  target during mobility. Pt states this helps.     Balance Overall balance assessment: Needs assistance   Sitting balance-Leahy Scale: Poor   Postural control: Posterior lean;Left lateral lean   Standing balance-Leahy Scale: Poor Standing balance comment: L bias                           ADL either performed or assessed with clinical judgement   ADL Overall ADL's : Needs assistance/impaired Eating/Feeding: Set up   Grooming: Sitting;Minimal assistance Grooming Details (indicate cue type and reason): Difficulty completing ADL unsupported - falls L Upper Body Bathing: Minimal assistance;Sitting   Lower Body Bathing: Moderate assistance;Sit to/from stand   Upper Body Dressing : Moderate assistance;Sitting   Lower Body Dressing: Moderate assistance;Sit to/from stand   Toilet Transfer: Moderate assistance;RW;Stand-pivot (+2 would be helpful) Toilet Transfer Details (indicate cue type and reason): simulated Toileting- Clothing Manipulation and Hygiene: Moderate assistance Toileting - Clothing Manipulation Details (indicate cue type and reason): unable to release RW and manipulate clothing     Functional mobility during ADLs: Moderate assistance;+2 for safety/equipment;Rolling walker;Cueing for safety;Cueing for sequencing General ADL Comments: Pt falls toward L when unsupported sitting EOB. motivated to complete ADL independently.     Vision Baseline Vision/History: Wears glasses Wears Glasses: Reading only Vision Assessment?: Vision impaired- to be further tested in functional context Additional Comments: horizontal nystagmus  especially in R gaze  Pt reports "dizziness " is worse when turning head toward R and with any "quick" head movement     Perception Perception Comments: will further assess. Keeping eyes closed majority of session   [place] tested?: Within functional  limits    Pertinent Vitals/Pain Pain Assessment: No/denies pain      Hand Dominance Right   Extremity/Trunk Assessment Upper Extremity Assessment Upper Extremity Assessment: RUE deficits/detail RUE Deficits / Details: strength WFL. Apparent ataxia. difficulty with manipulating objects but attempts to use RUE Coordination: decreased fine motor;decreased gross motor   Lower Extremity Assessment Lower Extremity Assessment: RLE deficits/detail;Defer to PT evaluation RLE Deficits / Details: ataxic   Cervical / Trunk Assessment Cervical / Trunk Assessment: Other exceptions Cervical / Trunk Exceptions: L lateral bias; trunkal ataxia   Communication Communication Communication: No difficulties   Cognition Arousal/Alertness: Awake/alert Behavior During Therapy: Flat affect Overall Cognitive Status: Within Functional Limits for tasks assessed                                 General Comments: Will further assess cognition.   General Comments       Exercises     Shoulder Instructions      Home Living Family/patient expects to be discharged to:: Private residence Living Arrangements: Spouse/significant other;Children Available Help at Discharge: Family;Available 24 hours/day Type of Home: House Home Access: Stairs to enter [employer] of Steps: 4 Entrance Stairs-Rails: None Home Layout: One level;Multi-level Alternate Level Stairs-Number of Steps: 2 Alternate Level Stairs-Rails: None Bathroom Shower/Tub: Tub/shower unit;Door   [employer] Toilet: Standard Bathroom Accessibility: Yes How Accessible: Accessible via walker Home Equipment: None          Prior Functioning/Environment Level of Independence: Independent        Comments: drives; retired; 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; 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        OT Problem List: Decreased activity tolerance;Impaired balance (sitting and/or standing);Impaired vision/perception;Decreased coordination;Decreased safety awareness;Decreased knowledge of use  of DME or AE;Impaired UE functional use      OT Treatment/Interventions: Self-care/ADL training;Therapeutic exercise;Neuromuscular education;DME and/or AE instruction;Therapeutic activities;Visual/perceptual remediation/compensation;Patient/family education;Balance training    OT Goals(Current goals can be found in the care plan section) Acute Rehab OT Goals Patient Stated Goal: to return to being independent OT Goal Formulation: With patient Time For Goal Achievement: 08/09/17 Potential to Achieve Goals: Good ADL Goals Pt Will Perform Grooming: with modified independence;sitting Pt Will Perform Upper Body Bathing: with modified independence;sitting Pt Will Perform Lower Body Bathing: with set-up;sit to/from stand Pt Will Perform Upper Body Dressing: with set-up;sitting Pt Will Perform Lower Body Dressing: with set-up;sit to/from stand Pt Will Transfer to Toilet: with supervision;ambulating;bedside commode  OT Frequency: Min 2X/week   Barriers to D/C:            Co-evaluation              AM-PAC PT "6 Clicks" Daily Activity     Outcome Measure Help from another person eating meals?: None Help from another person taking care of personal grooming?: A Little Help from another person toileting, which includes using toliet, bedpan, or urinal?: A Lot Help from another person bathing (including washing, rinsing, drying)?: A Lot Help from another person to put on and taking off regular upper body clothing?: A Little Help from another person to put on and taking off regular lower body clothing?: A Lot 6 Click Score: 16   End of Session Equipment Utilized During Treatment: Gait belt;Rolling walker Nurse Communication: Mobility status  Activity Tolerance: Patient tolerated treatment well Patient left: in chair;with call bell/phone within reach;with chair alarm set  OT Visit Diagnosis: Other abnormalities of gait and mobility (R26.89);Muscle  weakness (generalized) (M62.81);Ataxia,  unspecified (R27.0);Dizziness and giddiness (R42)                Time: 1012-1050 OT Time Calculation (min): 38 min Charges:  OT General Charges $OT Visit: 1 Visit OT Evaluation $OT Eval Moderate Complexity: 1 Mod OT Treatments $Self Care/Home Management : 23-37 mins G-Codes: OT G-codes **NOT FOR INPATIENT CLASS** Functional Assessment Tool Used: Clinical judgement Functional Limitation: Self care Self Care Current Status (J0122): At least 40 percent but less than 60 percent impaired, limited or restricted Self Care Goal Status (U4114): At least 1 percent but less than 20 percent impaired, limited or restricted   [facility], OT/L  [phone] 07/26/2017.  Celicia Minahan,HILLARY 07/26/2017, 11:17 AM

## 2017-07-27 LAB — BASIC METABOLIC PANEL
ANION GAP: 7 (ref 5–15)
BUN: 10 mg/dL (ref 6–20)
CALCIUM: 8.7 mg/dL — AB (ref 8.9–10.3)
CHLORIDE: 106 mmol/L (ref 101–111)
CO2: 26 mmol/L (ref 22–32)
Creatinine, Ser: 1.13 mg/dL (ref 0.61–1.24)
GFR calc Af Amer: >60 mL/min (ref >60)
GFR calc non Af Amer: >60 mL/min (ref >60)
GLUCOSE: 221 mg/dL — AB (ref 65–99)
Potassium: 4 mmol/L (ref 3.5–5.1)
Sodium: 139 mmol/L (ref 135–145)

## 2017-07-27 LAB — CBC
HEMATOCRIT: 38.8 % — AB (ref 39.0–52.0)
HEMOGLOBIN: 13 g/dL (ref 13.0–17.0)
MCH: 28.2 pg (ref 26.0–34.0)
MCHC: 33.5 g/dL (ref 30.0–36.0)
MCV: 84.2 fL (ref 78.0–100.0)
Platelets: 171 10*3/uL (ref 150–400)
RBC: 4.61 MIL/uL (ref 4.22–5.81)
RDW: 13.6 % (ref 11.5–15.5)
WBC: 6.5 10*3/uL (ref 4.0–10.5)

## 2017-07-27 LAB — GLUCOSE, CAPILLARY
GLUCOSE-CAPILLARY: 303 mg/dL — AB (ref 65–99)
Glucose-Capillary: 217 mg/dL — ABNORMAL HIGH (ref 65–99)
Glucose-Capillary: 215 mg/dL — ABNORMAL HIGH (ref 65–99)
Glucose-Capillary: 251 mg/dL — ABNORMAL HIGH (ref 65–99)
Glucose-Capillary: 271 mg/dL — ABNORMAL HIGH (ref 65–99)

## 2017-07-27 MED ORDER — BISACODYL 5 MG PO TBEC
5.0000 mg | DELAYED_RELEASE_TABLET | Freq: Every day | ORAL | Status: DC | PRN
Start: 2017-07-27 — End: 2017-07-28

## 2017-07-27 MED ORDER — FLEET ENEMA 7-19 GM/118ML RE ENEM
1.0000 | ENEMA | Freq: Every day | RECTAL | Status: DC | PRN
  Administered 2017-07-27: 1 via RECTAL
  Filled 2017-07-27: qty 1

## 2017-07-27 NOTE — Progress Notes (Signed)
    [facility] has been requested to perform a transesophageal echocardiogram on 09/07 for CVA.  After careful review of history and examination, the risks and benefits of transesophageal echocardiogram have been explained including risks of esophageal damage, perforation (1:10,000 risk), bleeding, pharyngeal hematoma as well as other potential complications associated with conscious sedation including aspiration, arrhythmia, respiratory failure and death. Alternatives to treatment were discussed, questions were answered. Patient is willing to proceed.   Earl [name] 07/27/2017 3:47 PM

## 2017-07-27 NOTE — Progress Notes (Signed)
Inpatient Diabetes Program Recommendations  AACE/ADA: New Consensus Statement on Inpatient Glycemic Control (2015)  Target Ranges:  Prepandial:   less than 140 mg/dL      Peak postprandial:   less than 180 mg/dL (1-2 hours)      Critically ill patients:  140 - 180 mg/dL   Lab Results  Component Value Date   GLUCAP 303 (H) 07/27/2017   HGBA1C 6.7 (H) 07/26/2017    Review of Glycemic Control Results for Earl [name], Earl [name] (MRN [id number]) as of 07/27/2017 14:51  Ref. Range 07/26/2017 12:54 07/26/2017 16:21 07/27/2017 01:19 07/27/2017 06:14 07/27/2017 11:14  Glucose-Capillary Latest Ref Range: 65 - 99 mg/dL 268 (H) 186 (H) 217 (H) 215 (H) 303 (H)   Diabetes history: DM 2 Outpatient Diabetes medications: Glipizide 5 mg tid, Metformin 500 mg 4x/day Current orders for Inpatient glycemic control: Novolog Sensitive 0-9 units tid  Inpatient Diabetes Program Recommendations:  Noted hyperglycemia. -Lantus 10 units qd while orals held -Increase Novolog to moderate correction scale  Thank you, Nani Gasser. Markitta Ausburn, RN, MSN, CDE  Diabetes Coordinator Inpatient Glycemic Control Team Team Pager [phone] (8am-5pm) 07/27/2017 2:53 PM

## 2017-07-27 NOTE — PMR Pre-admission (Signed)
PMR Admission Coordinator Pre-Admission Assessment  Patient: Earl [name] is an 76 y.o., male MRN: [id number] DOB: [birth year] Height: 5' 11.5" (181.6 cm) Weight: 82.1 kg (181 lb 1.6 oz)              Insurance Information HMO: yes    PPO:      PCP:      IPA:      80/20:      OTHER: medicare advantage plan PRIMARY: [employer] Medicare      Policy#: [id number]      Subscriber: pt CM Name: Orvan July      Phone#: [phone]     Fax#: [phone] Pre-Cert#: [id number] for 7 days f/u with Vevelyn Royals    Phone [phone] fax [phone]  Employer: retired Benefits:  Phone #: online     Name: 07/27/17 Eff. Date: 11/21/16     Deduct: none      Out of Pocket Max: $4400      Life Max: none CIR: $345 co pay per day days 1-5 then insurance covers 100%      SNF: no co pay days 1-20; $160 co pay per day days 21-48; no co pay days 49-100 Outpatient: $40 co pay per visit     Co-Pay: visits per medical neccesity Home Health: 100%      Co-Pay: visits per medical neccesity DME: 80%     Co-Pay: 20% Providers: pt choice  SECONDARY: none       Medicaid Application Date:       Case Manager:  Disability Application Date:       Case Worker:   Emergency Facilities manager Information    Name Relation Home Work Mobile   Faxon R Spouse [phone]       Current Medical History  Patient Admitting Diagnosis: right cerebellar infarct  History of Present Illness:  Earl [name] is a 76 y.o. male with history of  HTN, questionable history PAF, T2DM; who was admitted on 07/25/17 with dizziness, nausea and right sided weakness.   CT head reviewed, showing right cerebellar infarct. Per report, acute right cerebellar SCA infarct. CTA head/neck done revealing right AICA late acute/early subacute infarct, no vascular abnormality. Patient started on ASA for stroke prophylaxis  And TEE ordered for today for workup of embolic stroke.  Total: 0 NIHSS  Past Medical History  Past  Medical History:  Diagnosis Date  . Atrial fibrillation ([place])   . BPH (benign prostatic hyperplasia)   . Diabetes mellitus ([place])   . Hyperlipidemia   . Hypertension   . Other testicular hypofunction   . Type II or unspecified type diabetes mellitus without mention of complication, not stated as uncontrolled     Family History  family history includes Diabetes in his father; Sickle cell anemia in his mother; Stomach cancer in his father.  Prior Rehab/Hospitalizations:  Has the patient had major surgery during 100 days prior to admission? No  Current Medications   Current Facility-Administered Medications:  .  [MAR Hold] acetaminophen (TYLENOL) tablet 650 mg, 650 mg, Oral, Q4H PRN, 325 mg at 07/27/17 1521 **OR** [MAR Hold] acetaminophen (TYLENOL) solution 650 mg, 650 mg, Per Tube, Q4H PRN **OR** [MAR Hold] acetaminophen (TYLENOL) suppository 650 mg, 650 mg, Rectal, Q4H PRN, Emokpae, Ejiroghene E, MD .  Doug Sou Hold] atorvastatin (LIPITOR) tablet 40 mg, 40 mg, Oral, q1800, Emokpae, Ejiroghene E, MD, 40 mg at 07/27/17 1728 .  [MAR Hold] bisacodyl (DULCOLAX) EC tablet 5 mg, 5 mg, Oral, Daily PRN, Opyd,  Ilene Qua, MD .  Doug Sou Hold] clopidogrel (PLAVIX) tablet 75 mg, 75 mg, Oral, Daily, Patteson, Samuel A, NP, 75 mg at 07/28/17 0911 .  [MAR Hold] enoxaparin (LOVENOX) injection 40 mg, 40 mg, Subcutaneous, Q24H, Emokpae, Ejiroghene E, MD, 40 mg at 07/28/17 1610 .  [MAR Hold] finasteride (PROSCAR) tablet 5 mg, 5 mg, Oral, Daily, Emokpae, Ejiroghene E, MD, 5 mg at 07/28/17 0911 .  [MAR Hold] gabapentin (NEURONTIN) capsule 300 mg, 300 mg, Oral, BID, Emokpae, Ejiroghene E, MD, 300 mg at 07/28/17 0911 .  [MAR Hold] hydrALAZINE (APRESOLINE) injection 10 mg, 10 mg, Intravenous, Q4H PRN, Emokpae, Ejiroghene E, MD .  Doug Sou Hold] insulin aspart (novoLOG) injection 0-9 Units, 0-9 Units, Subcutaneous, TID WC, Emokpae, Ejiroghene E, MD, 5 Units at 07/28/17 0823 .  [MAR Hold] levothyroxine (SYNTHROID, LEVOTHROID)  tablet 25 mcg, 25 mcg, Oral, QAC breakfast, Emokpae, Ejiroghene E, MD, 25 mcg at 07/27/17 0909 .  [MAR Hold] magnesium oxide (MAG-OX) tablet 200 mg, 200 mg, Oral, Daily, Emokpae, Ejiroghene E, MD, 200 mg at 07/28/17 0910 .  [MAR Hold] meclizine (ANTIVERT) tablet 25 mg, 25 mg, Oral, TID, Emokpae, Ejiroghene E, MD, 25 mg at 07/28/17 0911 .  [MAR Hold] potassium chloride (K-DUR,KLOR-CON) CR tablet 10 mEq, 10 mEq, Oral, Daily, Emokpae, Ejiroghene E, MD, 10 mEq at 07/28/17 0907 .  [MAR Hold] senna-docusate (Senokot-S) tablet 1 tablet, 1 tablet, Oral, QHS PRN, Emokpae, Ejiroghene E, MD, 1 tablet at 07/27/17 1600 .  [MAR Hold] sodium phosphate ([name]) 7-19 GM/118ML enema 1 enema, 1 enema, Rectal, Daily PRN, Opyd, Ilene Qua, MD, 1 enema at 07/27/17 2234  Patients Current Diet: Regular diet with thin liquids  Precautions / Restrictions Precautions Precautions: Fall Restrictions Weight Bearing Restrictions: No   Has the patient had 2 or more falls or a fall with injury in the past year?No  Prior Activity Level Community (5-7x/wk): very active and independent ; volunteers, etc. Son states pt is very prideful and modest. Does not like to have others have to assist him in any way for he has always been the one to help others in his family and the community.  He does not like the need for CNAs and nurses having to assist with toilet ing needs. Very modest.  Home Assistive Devices / Equipment Home Assistive Devices/Equipment: CBG Meter, Eyeglasses Home Equipment: None  Prior Device Use: Indicate devices/aids used by the patient prior to current illness, exacerbation or injury? 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  Independent  Indoor Mobility: Did the patient need  assistance with walking from room to room (with or without device)? Independent  Stairs: Did the patient need assistance with internal or external stairs (with or without device)? Independent  Functional Cognition: Did the patient need help planning regular tasks such as shopping or remembering to take medications? Independent  Current Functional Level Cognition  Overall Cognitive Status: Within Functional Limits for tasks assessed Orientation Level: Oriented X4 General Comments: internally distracted by symptoms    Extremity Assessment (includes Sensation/Coordination)  Upper Extremity Assessment: RUE deficits/detail RUE Deficits / Details: decreased coordination finger to nose with R compared to L RUE Coordination: decreased fine motor, decreased gross motor  Lower Extremity Assessment: RLE deficits/detail, LLE deficits/detail RLE Deficits / Details: AROM and strength WFL, decreased coordination with heel to shin  LLE Deficits / Details: WFL    ADLs  Overall ADL's : Needs assistance/impaired Eating/Feeding: Set up Grooming: Sitting, Minimal assistance Grooming Details (indicate cue type and reason): Difficulty completing ADL unsupported - falls L Upper Body Bathing: Minimal assistance, Sitting Lower Body Bathing: Moderate assistance, Sit to/from stand Upper Body Dressing : Moderate assistance, Sitting Lower Body Dressing: Moderate assistance, Sit to/from stand Toilet Transfer: Moderate assistance, RW, Stand-pivot (+2 would be helpful) Toilet Transfer Details (indicate cue type and reason): simulated Toileting- Clothing Manipulation and Hygiene: Moderate assistance Toileting - Clothing Manipulation Details (indicate cue type and reason): unable to release RW and manipulate clothing Functional mobility during ADLs: Moderate assistance, +2 for safety/equipment, Rolling walker, Cueing for safety, Cueing for sequencing General ADL Comments: Pt falls toward L when unsupported sitting  EOB. motivated to complete ADL independently.    Mobility  Overal bed mobility: Needs Assistance Bed Mobility: Supine to Sit Supine to sit: Min guard Sit to supine: Min assist General bed mobility comments: assist for balance    Transfers  Overall transfer level: Needs assistance Equipment used: Rolling walker (2 wheeled) Transfers: Sit to/from Stand Sit to Stand: Min assist Stand pivot transfers: Mod assist, +2 safety/equipment General transfer comment: cues for hand placement, steadying assist     Ambulation / Gait / Stairs / Wheelchair Mobility  Ambulation/Gait Ambulation/Gait assistance: Min assist, Mod assist Ambulation Distance (Feet): 125 Feet Assistive device: Rolling walker (2 wheeled) Gait Pattern/deviations: Step-to pattern, Step-through pattern, Decreased stride length, Narrow base of support, Ataxic General Gait Details: cues to right head due to leaning L, cues throughout for visual orientation to upright, assist for balance, step width    Posture / Balance Dynamic Sitting Balance Sitting balance - Comments: initially reliant on hands for balance and occasional cues for R lateral lean, but hands in lap for testing and no LOB Balance Overall balance assessment: Needs assistance Sitting-balance support: Feet supported, No upper extremity supported Sitting balance-Leahy Scale: Fair Sitting balance - Comments: initially reliant on hands for balance and occasional cues for R lateral lean, but hands in lap for testing and no LOB Postural control: Left lateral lean Standing balance support: Bilateral upper extremity supported Standing balance-Leahy Scale: Poor Standing balance comment: UE support and cues/assist for balance in standing High Level Balance Comments: sidestepping, forward marching at wall rail bilateral UE support    Special needs/care consideration BiPAP/CPAP  N/a CPM  N/a Continuous Drip IV  N/a Dialysis  N/a Life Vest  N/a Oxygen n/a Special Bed   N/a Trach Size  N/a Wound Vac   N/a Skin  intact Bowel mgmt: continent LBM  07/27/17 after fleets enema, suppository and stool softners Bladder mgmt: continent Diabetic mgmt yes pta Hgb A1c 6.6 Possible new diagnosis of atrial fibrillation Patient does not like the need for bed and chair alarms that acute hospital has been using on him.   Previous Home Environment Living Arrangements: Spouse/significant other (daughter in [place], son in [place])  Lives With: Spouse Available Help at Discharge: Family, Available 24 hours/day Type of Home: House Home Layout: One level, Multi-level Alternate Level Stairs-Rails: None Alternate Level Stairs-Number of Steps:  (through garage; son to have rails installed before pt discha) Home Access: Stairs to enter Entrance Stairs-Rails: None Entrance Stairs-Number of Steps:  (front; 2 steps through garage) Bathroom Shower/Tub: Tub/shower unit, Door [employer]: Standard Bathroom Accessibility: Yes How Accessible: Accessible via [place]: No  Discharge Living Setting Plans for Discharge Living Setting: Patient's home, Lives with (  comment) (spouse) Type of Home at Discharge: House Discharge Home Layout: One level Discharge Home Access: Stairs to enter Entrance Stairs-Rails: None Entrance Stairs-Number of Steps: 2 steps though garage; 4 steps front door (son having rails installed ) Discharge Bathroom Shower/Tub: Tub/shower unit, Door Discharge Bathroom Toilet: Standard Discharge Bathroom Accessibility: Yes How Accessible: Accessible via walker Does the patient have any problems obtaining your medications?: No  Social/Family/Support Systems Patient Roles: Spouse, Parent, Psychologist, occupational (very activity in the community) Sport and exercise psychologist Information: [name], wife Anticipated Caregiver: wife Anticipated Ambulance person Information: see above Ability/Limitations of Caregiver: wife completed chemo and radiation 2 months ago Caregiver  Availability: 24/7 Discharge Plan Discussed with Primary Caregiver: Yes Is Caregiver In Agreement with Plan?: Yes Does Caregiver/Family have Issues with Lodging/Transportation while Pt is in Rehab?: No  Goals/Additional Needs Patient/Family Goal for Rehab: supervision with PT and OT Expected length of stay: ELOS 8- 13days Pt/Family Agrees to Admission and willing to participate: Yes Program Orientation Provided & Reviewed with Pt/Caregiver Including Roles  & Responsibilities: Yes  Decrease burden of Care through IP rehab admission: n/a  Possible need for SNF placement upon discharge:not anticipated  Patient Condition: This patient's condition remains as documented in the consult dated 07/26/17, in which the Rehabilitation Physician determined and documented that the patient's condition is appropriate for intensive rehabilitative care in an inpatient rehabilitation facility. Will admit to inpatient rehab today.  Preadmission Screen Completed By:  Cleatrice Burke, 07/28/2017 11:05 AM ______________________________________________________________________   Discussed status with Dr. Naaman Plummer on 07/28/2017 at  1105 and received telephone approval for admission today.  Admission Coordinator:  Cleatrice Burke, time 0932 Date 07/28/2017

## 2017-07-27 NOTE — Progress Notes (Signed)
Physical Therapy Treatment Patient Details Name: Earl [name] MRN: [id number] DOB: [birth year] Today's Date: 07/27/2017    History of Present Illness 76 y.o. male with medical history significant for type II DM, paroxysmal A. Fib, hypothyroidism, MGUS. Presented to ED wtih complaints of dizziness. Head CT without contrast- acute right cerebellar SCA territory infarct. Subsequent CT and her head and neck. Right AICA distribution late acute/early subacute infarction    PT Comments    Patient progressing with mobility and able to walk further in hallway, though fatigued from concentration with efforts to limit symptoms, work on stride length and adjust posture.  Feel continued skilled PT indicated in acute setting prior to d/c to CIR level rehab.    Follow Up Recommendations  CIR     Equipment Recommendations  Rolling walker with 5" wheels    Recommendations for Other Services Rehab consult     Precautions / Restrictions Precautions Precautions: Fall Restrictions Weight Bearing Restrictions: No    Mobility  Bed Mobility Overal bed mobility: Needs Assistance Bed Mobility: Supine to Sit     Supine to sit: Min guard     General bed mobility comments: assist for balance  Transfers Overall transfer level: Needs assistance Equipment used: Rolling walker (2 wheeled) Transfers: Sit to/from Stand Sit to Stand: Min assist         General transfer comment: cues for hand placement, steadying assist   Ambulation/Gait Ambulation/Gait assistance: Min assist;Mod assist Ambulation Distance (Feet): 125 Feet Assistive device: Rolling walker (2 wheeled) Gait Pattern/deviations: Step-to pattern;Step-through pattern;Decreased stride length;Narrow base of support;Ataxic     General Gait Details: cues to right head due to leaning L, cues throughout for visual orientation to upright, assist for balance, step width   Stairs            Wheelchair Mobility    Modified Rankin  (Stroke Patients Only) Modified Rankin (Stroke Patients Only) Pre-Morbid Rankin Score: No symptoms Modified Rankin: Moderately severe disability     Balance Overall balance assessment: Needs assistance   Sitting balance-Leahy Scale: Fair   Postural control: Left lateral lean Standing balance support: Bilateral upper extremity supported Standing balance-Leahy Scale: Poor                 High Level Balance Comments: sidestepping, forward marching at wall rail bilateral UE support            Cognition Arousal/Alertness: Awake/alert Behavior During Therapy: WFL for tasks assessed/performed Overall Cognitive Status: Within Functional Limits for tasks assessed                                        Exercises      General Comments General comments (skin integrity, edema, etc.): son in room and assisting with mobility, asking about visual compensation rationale; patient reports dizziness 4/10 with ambulation today and RPE 13/20      Pertinent Vitals/Pain Pain Assessment: No/denies pain    Home Living   Living Arrangements: Spouse/significant other (daughter in [place], son in [place])                  Prior Function            PT Goals (current goals can now be found in the care plan section) Progress towards PT goals: Progressing toward goals    Frequency    Min 4X/week      PT Plan Current plan  remains appropriate    Co-evaluation              AM-PAC PT "6 Clicks" Daily Activity  Outcome Measure  Difficulty turning over in bed (including adjusting bedclothes, sheets and blankets)?: A Little Difficulty moving from lying on back to sitting on the side of the bed? : A Lot Difficulty sitting down on and standing up from a chair with arms (e.g., wheelchair, bedside commode, etc,.)?: Unable Help needed moving to and from a bed to chair (including a wheelchair)?: A Little Help needed walking in hospital room?: A Little Help  needed climbing 3-5 steps with a railing? : A Lot 6 Click Score: 14    End of Session Equipment Utilized During Treatment: Gait belt Activity Tolerance: Patient limited by fatigue Patient left: in chair;with call bell/phone within reach;with family/visitor present;with chair alarm set   PT Visit Diagnosis: Unsteadiness on feet (R26.81);Other abnormalities of gait and mobility (R26.89);Dizziness and giddiness (R42)     Time: 9021-1155 PT Time Calculation (min) (ACUTE ONLY): 27 min  Charges:  $Gait Training: 8-22 mins $Neuromuscular Re-education: 8-22 mins                    G CodesMagda Kiel, [place] [phone] 07/27/2017    Reginia Naas 07/27/2017, 4:59 PM

## 2017-07-27 NOTE — Progress Notes (Deleted)
Patient is requesting note for work. MD notified.    Note printed and given to patient-still waiting on nephew to pick her up

## 2017-07-27 NOTE — Progress Notes (Signed)
Took over patient care at 0300.

## 2017-07-27 NOTE — Progress Notes (Deleted)
Patient was able to reach [facility]-she states that her Aspirin is not covered. She was encouraged to get that over the counter. Patient is transported to family vehicle.

## 2017-07-27 NOTE — Progress Notes (Signed)
STROKE TEAM PROGRESS NOTE   HISTORY OF PRESENT ILLNESS (per record)  Earl [name] is a 76 y.o. male with a history of diabetes and hypertension who presented with nausea and dizziness. He has atrial fibrillation documented in his past medical history, but according to his most recent cardiology's note (2013) there was no evidence for this  Even after 21 day heart monitorand the patient states he was never diagnosed with it.  The pt takes 81 mg ASA daily.  He was in his normal state of health until the morning of 07/25/2017 around 4 AM when he noticed that he was very dizzy. He is also nauseated. When he was better this afternoon, he sought further care and had a CT scan which demonstrates a moderate sized cerebellar infarct.  Patient was not administered IV t-PA secondary to arriving outside of the tPA treatment window. He was admitted to General Neurology for further evaluation and treatment.   SUBJECTIVE (INTERVAL HISTORY) His wife is at the bedside.  The patient is awake, alert, and follows all commands appropriately.  Suspect etiology of stroke is atrial fibrillation without anticoagulation but AFIB not definitely demonstrated yet.  TEE and loop recorder pending for tomorrow  OBJECTIVE Temp:  [98.4 F (36.9 C)-99.9 F (37.7 C)] 99.5 F (37.5 C) (09/06 1300) Pulse Rate:  [68-95] 95 (09/06 1300) Cardiac Rhythm: Normal sinus rhythm (09/06 0737) Resp:  [18] 18 (09/06 1300) BP: (149-178)/(77-91) 178/87 (09/06 1300) SpO2:  [96 %-100 %] 96 % (09/06 1300)  CBC:   Recent Labs Lab 07/25/17 1720 07/25/17 1732 07/27/17 0253  WBC 8.5  --  6.5  NEUTROABS 6.9  --   --   HGB 13.2 13.6 13.0  HCT 38.2* 40.0 38.8*  MCV 83.2  --  84.2  PLT 198  --  409    Basic Metabolic Panel:   Recent Labs Lab 07/25/17 1720 07/25/17 1732 07/27/17 0253  NA 139 139 139  K 4.2 4.2 4.0  CL 106 105 106  CO2 22  --  26  GLUCOSE 330* 331* 221*  BUN 14 16 10   CREATININE 1.16 1.00 1.13  CALCIUM 9.0   --  8.7*    Lipid Panel:     Component Value Date/Time   CHOL 147 07/26/2017 0551   TRIG 45 07/26/2017 0551   HDL 51 07/26/2017 0551   CHOLHDL 2.9 07/26/2017 0551   VLDL 9 07/26/2017 0551   LDLCALC 87 07/26/2017 0551   HgbA1c:  Lab Results  Component Value Date   HGBA1C 6.7 (H) 07/26/2017   Urine Drug Screen:     Component Value Date/Time   LABOPIA NONE DETECTED 07/25/2017 1911   COCAINSCRNUR NONE DETECTED 07/25/2017 1911   LABBENZ NONE DETECTED 07/25/2017 1911   AMPHETMU NONE DETECTED 07/25/2017 1911   THCU NONE DETECTED 07/25/2017 1911   LABBARB NONE DETECTED 07/25/2017 1911    Alcohol Level     Component Value Date/Time   ETH <5 07/25/2017 1720    IMAGING  Ct Angio Head W Or Wo Contrast Ct Angio Neck W Or Wo Contrast 07/26/2017 IMPRESSION: 1. Patent carotid and vertebral arteries. No dissection, aneurysm, or hemodynamically significant stenosis utilizing NASCET criteria. 2. Patent circle of Willis. No large vessel occlusion, aneurysm, or significant stenosis. 3. Right AICA distribution late acute/early subacute infarction. 4. No hemorrhage or abnormal enhancement in the brain. 5. Lipoma within strap muscles anterior to larynx. 6. Large nodule in left lobe of thyroid, stable from prior thyroid ultrasounds.  Ct Head Wo  Contrast 07/25/2017 IMPRESSION: 1. Acute right cerebellar SCA territory infarct. 2. No intracranial hemorrhage.  Mr Brain Wo Contrast 07/26/2017 IMPRESSION: 1. Acute ischemic superior right cerebellar infarct without significant mass effect. Associated mild petechial hemorrhage without frank hemorrhagic transformation. 2. Otherwise normal brain MRI.  TTE 07/26/2017 Study Conclusions - Left ventricle: The cavity size was normal. Wall thickness was   increased in a pattern of mild LVH. Systolic function was normal.   The estimated ejection fraction was in the range of 55% to 60%.   Wall motion was normal; there were no regional wall motion    abnormalities. Features are consistent with a pseudonormal left   ventricular filling pattern, with concomitant abnormal relaxation   and increased filling pressure (grade 2 diastolic dysfunction). - Aortic valve: There was mild regurgitation. - Mitral valve: There was mild regurgitation. - Right ventricle: The cavity size was mildly dilated. - Pulmonary arteries: Systolic pressure was mildly increased. PA   peak pressure: 39 mm Hg (S). Impressions: - Normal LV systolic function; moderate diastolic dysfunction; mild   LVH; mild AI; mild MR; mild RVE; mild TR with mildly elevated   pulmonary pressure.     PHYSICAL EXAM Pleasant elderly male not in distress. . Afebrile. Head is nontraumatic. Neck is supple without bruit.    Cardiac exam no murmur or gallop. Lungs are clear to auscultation. Distal pulses are well felt. Neurological Exam ;  Awake  Alert oriented x 3. Normal speech and language.eye movements full without nystagmus.fundi were not visualized. Vision acuity and fields appear normal. Hearing is normal. Palatal movements are normal. Face symmetric. Tongue midline. Normal strength, tone, reflexes and coordination. Normal sensation. Gait deferred.  ASSESSMENT/PLAN [name] is a 76 y.o. male with history of diabetes and hypertension who presented with nausea and dizziness. He did not receive IV t-PA due to arriving outside of the tPA treatment window.   Stroke:  Acute ischemic superior right cerebellar infarct with associated mild petechial hemorrhage, likely embolic, in the setting of questionable history of atrial fibrillation without anticoagulation.  Resultant  No deficits  CT head: no acute stroke  MRI head: acute ischemic superior right cerebellar infarct without significant mass effect. Associated mild petechial hemorrhage without frank hemorrhagic transformation.  MRA head: not performed  CTA head/neck: Right AICA distribution late acute/early subacute  infarction  2D Echo: EF 55-60%. No source of embolus   LDL 87  HgbA1c 6.6  Lovenox 40 mg sq daily for VTE prophylaxis Diet heart healthy/carb modified Room service appropriate? Yes; Fluid consistency: Thin  aspirin 81 mg daily prior to admission, now on aspirin 325 mg daily and clopidogrel 75 mg daily  Patient counseled to be compliant with his antithrombotic medications  Ongoing aggressive stroke risk factor management  Therapy recommendations:  CIR  Disposition:  pending  Hypertension  Stable  Permissive hypertension (OK if < 220/120) but gradually normalize in 5-7 days  Long-term BP goal normotensive  Hyperlipidemia  Home meds: simvastatin 40 mg PO daily, resumed in hospital  LDL 87, goal < 70  Stop simvastatin, add atorvastatin 40 mg PO daily  Continue atorvastatin at discharge  Diabetes  HgbA1c 6.6, goal < 7.0  Controlled  Other Stroke Risk Factors  Advanced age  Family hx sickle cell disease  ?atrial fibrillation without anticoagulation  Other Active Problems  None  Hospital day # 1  I have personally examined this patient, reviewed notes, independently viewed imaging studies, participated in medical decision making and plan of care.ROS completed by  me personally and pertinent positives fully documented  I have made any additions or clarifications directly to the above note. 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 After hours, contact General Neurology

## 2017-07-27 NOTE — Progress Notes (Deleted)
[facility] states that [facility] called her to say that one of her medicines will not be covered under her insurance. PT is asked to call and ask the pharmacy which medication, so that we can know how/if we can assist.

## 2017-07-27 NOTE — Progress Notes (Signed)
PROGRESS NOTE    Earl [name]  PIR:[id number] DOB: Jul 02, 1941 DOA: 07/25/2017 PCP: [name], MD   Chief Complaint  Patient presents with  . Nausea  . Otalgia  . Dizziness    Brief Narrative:  HPI on 07/25/2017 by Dr. Jenetta Downer Earl [name] is a 76 y.o. male with medical history significant for type II DM, paroxysmal A. Fib, hypothyroidism, MGUS, who presented to the ED today with complaints of ear fullness bilaterally, with drinking and subsequently dizziness that started about 4 AM this morning presents while lying down, severe enough to prevent him from walking or getting up. It is associated vomiting- 5 times today, nonbloody, consistent recently ingested meals. No facial asymmetry, no vision change, slurred speech, no extremity weakness. Patient endorses recent good PO intake, no diarrhea, no black stools. Patient has been compliant with all his medications- which includes 4 antihypertensives, including aspirin 81mg  daily for prophylaxis. Patient denies tobacco abuse history, alcohol use or illicit drug use. Assessment & Plan   Acute CVA -CT head showed acute right cerebellar SCA territory infarct -CTA head and neck showed patent carotid and vertebral arteries, no dissection or aneurysm. Patent circle of Willis. Right AICA distribution late acute/early subacute infarction. -MRI brain showed acute ischemic superior right cerebellar infarct without mass effect. Associated mild petechial hemorrhage without frank hemorrhagic transformation. -Neurology consulted and appreciated -Echocardiogram EF 63-01%, grade 2 diastolic dysfunction -Hemoglobin A1c 6.7 -LDL 87 -PT and OT consult, recommended CIR -In patient rehabilitation consulted, awaiting bed -Continue aspirin, statin -Neurology recommended TEE with loop recorder -Cardiology consulted, TEE/loop recorded to be done on 07/28/2017  Dizziness -Likely secondary to the above -orthostatic vitals  unremarkable -Continue meclizine  Essential hypertension -Allow for permissive hypertension given its use CVA -Atenolol, Ziac, quinapril held  Diabetes Mellitus, type II -Hemoglobin A1c 6.7 -Glipizide and metformin held -Continue insulin sliding scale CBG monitoring -Will add on Lantus 10 units  History of MGUS -Follows with Dr. Alen Blew  Transient episode of atrial fibrillation -Occurred in 2013 after colonoscopy -Patient was finally by Dr. Angelena Form, cardiology. Patient did have an event monitor showed sinus rhythm with frequent PACs, rare PVCs. Patient will start on aspirin 325 mg daily  Hypothyroidism -Continue Synthroid  Hyperlipidemia -Patient uses simvastatin at home however taking half a tablet 3 times per week -Lipid panel showed total cholesterol 147, HDL 51, LDL 87, triglycerides 45 -Continue statin  DVT Prophylaxis  Lovenox  Code Status: Full  Family Communication: Family at bedside  Disposition Plan: Admitted, pending TEE and loop recorder. CIR at discharge  Consultants Neurology Inpatient rehabilitation  Procedures  Echocardiogram  Antibiotics   Anti-infectives    None      Subjective:   Earl [name] seen and examined today.  Continues to complain of headache. Feels dizziness is mildly improved. Denies chest pain, short of breath, abdominal pain, nausea vomiting, diarrhea or constipation.  Objective:   Vitals:   07/26/17 2116 07/27/17 0120 07/27/17 0514 07/27/17 1300  BP: (!) 166/80 (!) 166/91 (!) 149/85 (!) 178/87  Pulse: 74 83 72 95  Resp: 18 18 18 18   Temp: 99.5 F (37.5 C) 99.9 F (37.7 C) 99.2 F (37.3 C) 99.5 F (37.5 C)  TempSrc: Oral Oral Oral Oral  SpO2: 100% 96% 97% 96%  Weight:      Height:        Intake/Output Summary (Last 24 hours) at 07/27/17 1458 Last data filed at 07/27/17 1400  Gross per 24 hour  Intake  600 ml  Output             1000 ml  Net             -400 ml   Filed Weights   07/26/17  0300  Weight: 82.1 kg (181 lb 1.6 oz)   Exam  General: Well developed, well nourished, NAD, appears stated age  81: NCAT, mucous membranes moist.   Cardiovascular: S1 S2 auscultated, RRR, no murmurs   Respiratory: Clear to auscultation bilaterally with equal chest rise  Abdomen: Soft, nontender, nondistended, + bowel sounds  Extremities: warm dry without cyanosis clubbing or edema  Neuro: AAOx3, nonfocal  Psych: Normal affect and demeanor  Data Reviewed: I have personally reviewed following labs and imaging studies  CBC:  Recent Labs Lab 07/25/17 1720 07/25/17 1732 07/27/17 0253  WBC 8.5  --  6.5  NEUTROABS 6.9  --   --   HGB 13.2 13.6 13.0  HCT 38.2* 40.0 38.8*  MCV 83.2  --  84.2  PLT 198  --  256   Basic Metabolic Panel:  Recent Labs Lab 07/25/17 1720 07/25/17 1732 07/27/17 0253  NA 139 139 139  K 4.2 4.2 4.0  CL 106 105 106  CO2 22  --  26  GLUCOSE 330* 331* 221*  BUN 14 16 10   CREATININE 1.16 1.00 1.13  CALCIUM 9.0  --  8.7*   GFR: Estimated Creatinine Clearance: 60.2 mL/min (by C-G formula based on SCr of 1.13 mg/dL). Liver Function Tests:  Recent Labs Lab 07/25/17 1720  AST 24  ALT 19  ALKPHOS 65  BILITOT 0.7  PROT 6.2*  ALBUMIN 3.7   No results for input(s): LIPASE, AMYLASE in the last 168 hours. No results for input(s): AMMONIA in the last 168 hours. Coagulation Profile:  Recent Labs Lab 07/25/17 1720  INR 0.99   Cardiac Enzymes: No results for input(s): CKTOTAL, CKMB, CKMBINDEX, TROPONINI in the last 168 hours. BNP (last 3 results) No results for input(s): PROBNP in the last 8760 hours. HbA1C:  Recent Labs  07/26/17 0551  HGBA1C 6.7*   CBG:  Recent Labs Lab 07/26/17 1254 07/26/17 1621 07/27/17 0119 07/27/17 0614 07/27/17 1114  GLUCAP 268* 186* 217* 215* 303*   Lipid Profile:  Recent Labs  07/26/17 0551  CHOL 147  HDL 51  LDLCALC 87  TRIG 45  CHOLHDL 2.9   Thyroid Function Tests: No results for  input(s): TSH, T4TOTAL, FREET4, T3FREE, THYROIDAB in the last 72 hours. Anemia Panel: No results for input(s): VITAMINB12, FOLATE, FERRITIN, TIBC, IRON, RETICCTPCT in the last 72 hours. Urine analysis:    Component Value Date/Time   COLORURINE YELLOW 07/25/2017 1911   APPEARANCEUR CLEAR 07/25/2017 1911   LABSPEC 1.014 07/25/2017 1911   PHURINE 6.0 07/25/2017 1911   GLUCOSEU >=500 (A) 07/25/2017 1911   HGBUR NEGATIVE 07/25/2017 1911   BILIRUBINUR NEGATIVE 07/25/2017 1911   KETONESUR 20 (A) 07/25/2017 1911   PROTEINUR 30 (A) 07/25/2017 1911   UROBILINOGEN 0.2 05/05/2015 1443   NITRITE NEGATIVE 07/25/2017 1911   LEUKOCYTESUR NEGATIVE 07/25/2017 1911   Sepsis Labs: @LABRCNTIP (procalcitonin:4,lacticidven:4)  )No results found for this or any previous visit (from the past 240 hour(s)).    Radiology Studies: Ct Angio Head W Or Wo Contrast  Result Date: 07/26/2017 CLINICAL DATA:  76 y/o M; dizziness and nausea with vomiting. Evaluation for stroke. EXAM: CT ANGIOGRAPHY HEAD AND NECK TECHNIQUE: Multidetector CT imaging of the head and neck was performed using the standard protocol during  bolus administration of intravenous contrast. Multiplanar CT image reconstructions and MIPs were obtained to evaluate the vascular anatomy. Carotid stenosis measurements (when applicable) are obtained utilizing NASCET criteria, using the distal internal carotid diameter as the denominator. CONTRAST:  50 cc Isovue 370 COMPARISON:  07/25/2017 CT head, 04/29/2013 thyroid ultrasound FINDINGS: CTA NECK FINDINGS Aortic arch: Standard branching. Imaged portion shows no evidence of aneurysm or dissection. No significant stenosis of the major arch vessel origins. Mild calcific atherosclerosis. Right carotid system: No evidence of dissection, stenosis (50% or greater) or occlusion. Left carotid system: No evidence of dissection, stenosis (50% or greater) or occlusion. Minimal calcific atherosclerosis of the carotid  bifurcation. Vertebral arteries: Codominant. No evidence of dissection, stenosis (50% or greater) or occlusion. Skeleton: Mild cervical spondylosis with disc facet degenerative changes greatest in the upper cervical spine. L3-4 small central disc protrusion. No high-grade bony canal or foraminal stenosis. Other neck: Lipoma measuring 23 x 51 x 53 mm (AP x ML x CC series 6: Image 263, 8:102) within strap muscles anterior to larynx. Left lobe of thyroid nodule measuring 41 x 48 x 53 mm. Upper chest: Negative peer Review of the MIP images confirms the above findings CTA HEAD FINDINGS Anterior circulation: No significant stenosis, proximal occlusion, aneurysm, or vascular malformation. Mild non stenotic calcific atherosclerosis of carotid siphons. Posterior circulation: No significant stenosis, proximal occlusion, aneurysm, or vascular malformation. A diminutive right AICA branches identified. Venous sinuses: As permitted by contrast timing, patent. Anatomic variants: Anterior and bilateral posterior communicating arteries are present. Large left A1, small right A1, and large anterior communicating artery, normal variant. Delayed phase: Right PICA distribution hypoattenuation with loss of gray-white differentiation compatible with late acute to subacute infarction. No abnormal enhancement of the brain. Review of the MIP images confirms the above findings IMPRESSION: 1. Patent carotid and vertebral arteries. No dissection, aneurysm, or hemodynamically significant stenosis utilizing NASCET criteria. 2. Patent circle of Willis. No large vessel occlusion, aneurysm, or significant stenosis. 3. Right AICA distribution late acute/early subacute infarction. 4. No hemorrhage or abnormal enhancement in the brain. 5. Lipoma within strap muscles anterior to larynx. 6. Large nodule in left lobe of thyroid, stable from prior thyroid ultrasounds. These results were called by telephone at the time of interpretation on 07/26/2017 at 1:11  am to Dr. Roland Rack , who verbally acknowledged these results. Electronically Signed   By: Kristine Garbe M.D.   On: 07/26/2017 01:14   Ct Head Wo Contrast  Result Date: 07/25/2017 CLINICAL DATA:  Weakness and ataxia. EXAM: CT HEAD WITHOUT CONTRAST TECHNIQUE: Contiguous axial images were obtained from the base of the skull through the vertex without intravenous contrast. COMPARISON:  None. FINDINGS: Brain: There is a moderate-sized, wedge-shaped region of edema in the right cerebellum consistent with acute SCA territory infarct. No intracranial hemorrhage, mass, midline shift, or extra-axial fluid collection is identified. Mild cerebral atrophy is within normal limits for age. Vascular: Carotid siphon atherosclerosis.  No hyperdense vessel. Skull: No fracture focal osseous lesion. Sinuses/Orbits: Visualized paranasal sinuses and mastoid air cells are clear. Bilateral cataract extraction is noted. Other: None. IMPRESSION: 1. Acute right cerebellar SCA territory infarct. 2. No intracranial hemorrhage. Electronically Signed   By: Logan Bores M.D.   On: 07/25/2017 17:53   Ct Angio Neck W Or Wo Contrast  Result Date: 07/26/2017 CLINICAL DATA:  76 y/o M; dizziness and nausea with vomiting. Evaluation for stroke. EXAM: CT ANGIOGRAPHY HEAD AND NECK TECHNIQUE: Multidetector CT imaging of the head and neck was  performed using the standard protocol during bolus administration of intravenous contrast. Multiplanar CT image reconstructions and MIPs were obtained to evaluate the vascular anatomy. Carotid stenosis measurements (when applicable) are obtained utilizing NASCET criteria, using the distal internal carotid diameter as the denominator. CONTRAST:  50 cc Isovue 370 COMPARISON:  07/25/2017 CT head, 04/29/2013 thyroid ultrasound FINDINGS: CTA NECK FINDINGS Aortic arch: Standard branching. Imaged portion shows no evidence of aneurysm or dissection. No significant stenosis of the major arch vessel  origins. Mild calcific atherosclerosis. Right carotid system: No evidence of dissection, stenosis (50% or greater) or occlusion. Left carotid system: No evidence of dissection, stenosis (50% or greater) or occlusion. Minimal calcific atherosclerosis of the carotid bifurcation. Vertebral arteries: Codominant. No evidence of dissection, stenosis (50% or greater) or occlusion. Skeleton: Mild cervical spondylosis with disc facet degenerative changes greatest in the upper cervical spine. L3-4 small central disc protrusion. No high-grade bony canal or foraminal stenosis. Other neck: Lipoma measuring 23 x 51 x 53 mm (AP x ML x CC series 6: Image 263, 8:102) within strap muscles anterior to larynx. Left lobe of thyroid nodule measuring 41 x 48 x 53 mm. Upper chest: Negative peer Review of the MIP images confirms the above findings CTA HEAD FINDINGS Anterior circulation: No significant stenosis, proximal occlusion, aneurysm, or vascular malformation. Mild non stenotic calcific atherosclerosis of carotid siphons. Posterior circulation: No significant stenosis, proximal occlusion, aneurysm, or vascular malformation. A diminutive right AICA branches identified. Venous sinuses: As permitted by contrast timing, patent. Anatomic variants: Anterior and bilateral posterior communicating arteries are present. Large left A1, small right A1, and large anterior communicating artery, normal variant. Delayed phase: Right PICA distribution hypoattenuation with loss of gray-white differentiation compatible with late acute to subacute infarction. No abnormal enhancement of the brain. Review of the MIP images confirms the above findings IMPRESSION: 1. Patent carotid and vertebral arteries. No dissection, aneurysm, or hemodynamically significant stenosis utilizing NASCET criteria. 2. Patent circle of Willis. No large vessel occlusion, aneurysm, or significant stenosis. 3. Right AICA distribution late acute/early subacute infarction. 4. No  hemorrhage or abnormal enhancement in the brain. 5. Lipoma within strap muscles anterior to larynx. 6. Large nodule in left lobe of thyroid, stable from prior thyroid ultrasounds. These results were called by telephone at the time of interpretation on 07/26/2017 at 1:11 am to Dr. Roland Rack , who verbally acknowledged these results. Electronically Signed   By: Kristine Garbe M.D.   On: 07/26/2017 01:14   Mr Brain Wo Contrast  Result Date: 07/26/2017 CLINICAL DATA:  Initial evaluation for acute stroke. EXAM: MRI HEAD WITHOUT CONTRAST TECHNIQUE: Multiplanar, multiecho pulse sequences of the brain and surrounding structures were obtained without intravenous contrast. COMPARISON:  Comparison made with prior CTs from 07/25/2017 as well as 07/26/2017. FINDINGS: Brain: Age-appropriate cerebral atrophy. No significant cerebral white matter for age. Confluent restricted diffusion involving the superior right cerebellar hemisphere, compatible with acute ischemic infarct, right superior cerebral artery territory. No significant mass effect. Scattered petechial hemorrhage without evidence for frank hemorrhagic transformation (series 9, image 24). No other acute infarct. Gray-white matter differentiation otherwise maintained. No other evidence for acute or chronic intracranial hemorrhage. No mass lesion, midline shift or mass effect. No hydrocephalus. No extra-axial fluid collection. Vascular: Major intracranial vascular flow voids are maintained. Skull and upper cervical spine: Craniocervical junction normal. Visualized upper cervical spine unremarkable. Bone marrow signal intensity within normal limits. No scalp soft tissue abnormality. Sinuses/Orbits: Globes and orbital soft tissues within normal limits. Patient status post  lens extraction bilaterally. Paranasal sinuses are clear. Trace right mastoid effusion noted. Inner ear structures normal. Other: None. IMPRESSION: 1. Acute ischemic superior right  cerebellar infarct without significant mass effect. Associated mild petechial hemorrhage without frank hemorrhagic transformation. 2. Otherwise normal brain MRI. Electronically Signed   By: Jeannine Boga M.D.   On: 07/26/2017 12:57     Scheduled Meds: . atorvastatin  40 mg Oral q1800  . clopidogrel  75 mg Oral Daily  . enoxaparin (LOVENOX) injection  40 mg Subcutaneous Q24H  . finasteride  5 mg Oral Daily  . gabapentin  300 mg Oral BID  . insulin aspart  0-9 Units Subcutaneous TID WC  . levothyroxine  25 mcg Oral QAC breakfast  . magnesium oxide  200 mg Oral Daily  . meclizine  25 mg Oral TID  . potassium chloride  10 mEq Oral Daily   Continuous Infusions:   LOS: 1 day   Time Spent in minutes   30 minutes  Earl [name] D.O. on 07/27/2017 at 2:58 PM  Between 7am to 7pm - Pager - [phone]  After 7pm go to [email or web address] - password [email or web address]  And look for the night coverage person covering for me after hours  [name] Office  [phone]

## 2017-07-27 NOTE — Progress Notes (Signed)
I have insurance approval for an inpt rehab admit. Pt for TEE Friday. I met with pt and family at bedside to discuss an inpt rehab admit. They are in agreement. I will follow up Friday to verify bed availability to admit after medical workup complete. [phone]

## 2017-07-28 ENCOUNTER — Inpatient Hospital Stay (HOSPITAL_COMMUNITY): Payer: Medicare Other | Admitting: Certified Registered"

## 2017-07-28 ENCOUNTER — Encounter (HOSPITAL_COMMUNITY)
Admission: EM | Disposition: A | Discharge status: Rehabilitation Facility | Payer: Self-pay | Source: Home / Self Care | Attending: Internal Medicine

## 2017-07-28 ENCOUNTER — Inpatient Hospital Stay (HOSPITAL_COMMUNITY): Payer: Medicare Other

## 2017-07-28 ENCOUNTER — Inpatient Hospital Stay (HOSPITAL_COMMUNITY)
Admission: RE | Admit: 2017-07-28 | Discharge: 2017-08-08 | DRG: 057 | Disposition: A | Discharge status: Home / Self Care | Payer: Medicare Other | Source: Intra-hospital | Attending: Physical Medicine & Rehabilitation | Admitting: Physical Medicine & Rehabilitation

## 2017-07-28 ENCOUNTER — Encounter (HOSPITAL_COMMUNITY): Payer: Self-pay | Admitting: *Deleted

## 2017-07-28 DIAGNOSIS — E039 Hypothyroidism, unspecified: Secondary | ICD-10-CM | POA: Diagnosis present

## 2017-07-28 DIAGNOSIS — E785 Hyperlipidemia, unspecified: Secondary | ICD-10-CM | POA: Diagnosis not present

## 2017-07-28 DIAGNOSIS — E8809 Other disorders of plasma-protein metabolism, not elsewhere classified: Secondary | ICD-10-CM | POA: Diagnosis not present

## 2017-07-28 DIAGNOSIS — I69393 Ataxia following cerebral infarction: Secondary | ICD-10-CM | POA: Diagnosis not present

## 2017-07-28 DIAGNOSIS — N400 Enlarged prostate without lower urinary tract symptoms: Secondary | ICD-10-CM | POA: Diagnosis not present

## 2017-07-28 DIAGNOSIS — Z79899 Other long term (current) drug therapy: Secondary | ICD-10-CM

## 2017-07-28 DIAGNOSIS — E1159 Type 2 diabetes mellitus with other circulatory complications: Secondary | ICD-10-CM | POA: Diagnosis not present

## 2017-07-28 DIAGNOSIS — R2689 Other abnormalities of gait and mobility: Secondary | ICD-10-CM | POA: Diagnosis not present

## 2017-07-28 DIAGNOSIS — E46 Unspecified protein-calorie malnutrition: Secondary | ICD-10-CM | POA: Diagnosis not present

## 2017-07-28 DIAGNOSIS — Z7982 Long term (current) use of aspirin: Secondary | ICD-10-CM | POA: Diagnosis not present

## 2017-07-28 DIAGNOSIS — I4891 Unspecified atrial fibrillation: Secondary | ICD-10-CM

## 2017-07-28 DIAGNOSIS — D62 Acute posthemorrhagic anemia: Secondary | ICD-10-CM | POA: Diagnosis not present

## 2017-07-28 DIAGNOSIS — K5900 Constipation, unspecified: Secondary | ICD-10-CM | POA: Diagnosis not present

## 2017-07-28 DIAGNOSIS — I639 Cerebral infarction, unspecified: Secondary | ICD-10-CM | POA: Diagnosis not present

## 2017-07-28 DIAGNOSIS — I6930 Unspecified sequelae of cerebral infarction: Secondary | ICD-10-CM | POA: Diagnosis present

## 2017-07-28 DIAGNOSIS — E119 Type 2 diabetes mellitus without complications: Secondary | ICD-10-CM | POA: Diagnosis present

## 2017-07-28 DIAGNOSIS — I69351 Hemiplegia and hemiparesis following cerebral infarction affecting right dominant side: Secondary | ICD-10-CM | POA: Diagnosis not present

## 2017-07-28 DIAGNOSIS — Z8673 Personal history of transient ischemic attack (TIA), and cerebral infarction without residual deficits: Secondary | ICD-10-CM | POA: Diagnosis present

## 2017-07-28 DIAGNOSIS — I480 Paroxysmal atrial fibrillation: Secondary | ICD-10-CM | POA: Diagnosis present

## 2017-07-28 DIAGNOSIS — I340 Nonrheumatic mitral (valve) insufficiency: Secondary | ICD-10-CM

## 2017-07-28 DIAGNOSIS — Z7984 Long term (current) use of oral hypoglycemic drugs: Secondary | ICD-10-CM

## 2017-07-28 DIAGNOSIS — Z833 Family history of diabetes mellitus: Secondary | ICD-10-CM

## 2017-07-28 DIAGNOSIS — I10 Essential (primary) hypertension: Secondary | ICD-10-CM | POA: Diagnosis present

## 2017-07-28 DIAGNOSIS — I638 Other cerebral infarction: Secondary | ICD-10-CM

## 2017-07-28 DIAGNOSIS — Z800 Family history of malignant neoplasm of digestive organs: Secondary | ICD-10-CM

## 2017-07-28 DIAGNOSIS — Z832 Family history of diseases of the blood and blood-forming organs and certain disorders involving the immune mechanism: Secondary | ICD-10-CM

## 2017-07-28 DIAGNOSIS — R7309 Other abnormal glucose: Secondary | ICD-10-CM | POA: Diagnosis not present

## 2017-07-28 HISTORY — PX: TEE WITHOUT CARDIOVERSION: SHX5443

## 2017-07-28 HISTORY — PX: LOOP RECORDER INSERTION: EP1214

## 2017-07-28 LAB — BASIC METABOLIC PANEL
ANION GAP: 10 (ref 5–15)
BUN: 11 mg/dL (ref 6–20)
CO2: 22 mmol/L (ref 22–32)
Calcium: 8.6 mg/dL — ABNORMAL LOW (ref 8.9–10.3)
Chloride: 105 mmol/L (ref 101–111)
Creatinine, Ser: 1.09 mg/dL (ref 0.61–1.24)
GFR calc Af Amer: >60 mL/min (ref >60)
GFR calc non Af Amer: >60 mL/min (ref >60)
GLUCOSE: 281 mg/dL — AB (ref 65–99)
POTASSIUM: 4.1 mmol/L (ref 3.5–5.1)
Sodium: 137 mmol/L (ref 135–145)

## 2017-07-28 LAB — GLUCOSE, CAPILLARY
GLUCOSE-CAPILLARY: 201 mg/dL — AB (ref 65–99)
GLUCOSE-CAPILLARY: 200 mg/dL — AB (ref 65–99)
Glucose-Capillary: 261 mg/dL — ABNORMAL HIGH (ref 65–99)
Glucose-Capillary: 349 mg/dL — ABNORMAL HIGH (ref 65–99)

## 2017-07-28 LAB — CBC
HEMATOCRIT: 41.4 % (ref 39.0–52.0)
Hemoglobin: 14.3 g/dL (ref 13.0–17.0)
MCH: 28.7 pg (ref 26.0–34.0)
MCHC: 34.5 g/dL (ref 30.0–36.0)
MCV: 83.1 fL (ref 78.0–100.0)
Platelets: 191 10*3/uL (ref 150–400)
RBC: 4.98 MIL/uL (ref 4.22–5.81)
RDW: 13.4 % (ref 11.5–15.5)
WBC: 7.8 10*3/uL (ref 4.0–10.5)

## 2017-07-28 SURGERY — LOOP RECORDER INSERTION

## 2017-07-28 SURGERY — ECHOCARDIOGRAM, TRANSESOPHAGEAL
Anesthesia: Monitor Anesthesia Care

## 2017-07-28 MED ORDER — LEVOTHYROXINE SODIUM 25 MCG PO TABS
25.0000 ug | ORAL_TABLET | Freq: Every day | ORAL | Status: DC
  Administered 2017-07-29 – 2017-08-08 (×11): 25 ug via ORAL
  Filled 2017-07-28 (×11): qty 1

## 2017-07-28 MED ORDER — GUAIFENESIN-DM 100-10 MG/5ML PO SYRP: 5.0000 mL | ORAL_SOLUTION | Freq: Four times a day (QID) | ORAL | Status: DC | PRN

## 2017-07-28 MED ORDER — TRAZODONE HCL 50 MG PO TABS
25.0000 mg | ORAL_TABLET | Freq: Every evening | ORAL | Status: DC | PRN
  Filled 2017-07-28: qty 1

## 2017-07-28 MED ORDER — LIDOCAINE-EPINEPHRINE 1 %-1:100000 IJ SOLN
INTRAMUSCULAR | Status: AC
  Filled 2017-07-28: qty 1

## 2017-07-28 MED ORDER — SENNOSIDES-DOCUSATE SODIUM 8.6-50 MG PO TABS
2.0000 | ORAL_TABLET | Freq: Every day | ORAL | Status: DC
  Administered 2017-07-28 – 2017-08-07 (×11): 2 via ORAL
  Filled 2017-07-28 (×11): qty 2

## 2017-07-28 MED ORDER — PROCHLORPERAZINE 25 MG RE SUPP: 12.5000 mg | Freq: Four times a day (QID) | RECTAL | Status: DC | PRN

## 2017-07-28 MED ORDER — FINASTERIDE 5 MG PO TABS
5.0000 mg | ORAL_TABLET | Freq: Every day | ORAL | Status: DC
  Administered 2017-07-29 – 2017-08-07 (×10): 5 mg via ORAL
  Filled 2017-07-28 (×14): qty 1

## 2017-07-28 MED ORDER — ENOXAPARIN SODIUM 40 MG/0.4ML ~~LOC~~ SOLN
40.0000 mg | SUBCUTANEOUS | Status: DC
  Administered 2017-07-29 – 2017-08-01 (×4): 40 mg via SUBCUTANEOUS
  Filled 2017-07-28 (×4): qty 0.4

## 2017-07-28 MED ORDER — CLOPIDOGREL BISULFATE 75 MG PO TABS
75.0000 mg | ORAL_TABLET | Freq: Every day | ORAL | Status: DC
  Administered 2017-07-29 – 2017-08-01 (×4): 75 mg via ORAL
  Filled 2017-07-28 (×4): qty 1

## 2017-07-28 MED ORDER — SODIUM CHLORIDE 0.9 % IV SOLN
INTRAVENOUS | Status: DC
  Administered 2017-07-28: 12:00:00 via INTRAVENOUS

## 2017-07-28 MED ORDER — PROPOFOL 500 MG/50ML IV EMUL
INTRAVENOUS | Status: DC | PRN
  Administered 2017-07-28: 100 ug/kg/min via INTRAVENOUS

## 2017-07-28 MED ORDER — INSULIN ASPART 100 UNIT/ML ~~LOC~~ SOLN
0.0000 [IU] | Freq: Every day | SUBCUTANEOUS | Status: DC
  Administered 2017-07-28 – 2017-08-03 (×3): 2 [IU] via SUBCUTANEOUS

## 2017-07-28 MED ORDER — BISACODYL 10 MG RE SUPP: 10.0000 mg | Freq: Every day | RECTAL | Status: DC | PRN

## 2017-07-28 MED ORDER — GLIPIZIDE 5 MG PO TABS
5.0000 mg | ORAL_TABLET | Freq: Two times a day (BID) | ORAL | Status: DC
  Administered 2017-07-29 – 2017-08-01 (×7): 5 mg via ORAL
  Filled 2017-07-28 (×7): qty 1

## 2017-07-28 MED ORDER — MECLIZINE HCL 25 MG PO TABS
25.0000 mg | ORAL_TABLET | Freq: Three times a day (TID) | ORAL | Status: DC
  Administered 2017-07-28 – 2017-08-08 (×31): 25 mg via ORAL
  Filled 2017-07-28 (×30): qty 1

## 2017-07-28 MED ORDER — GABAPENTIN 300 MG PO CAPS
300.0000 mg | ORAL_CAPSULE | Freq: Two times a day (BID) | ORAL | Status: DC
  Administered 2017-07-28 – 2017-08-08 (×22): 300 mg via ORAL
  Filled 2017-07-28 (×22): qty 1

## 2017-07-28 MED ORDER — MULTIVITAMINS PO CAPS: 1.0000 | ORAL_CAPSULE | Freq: Every day | ORAL | Status: DC

## 2017-07-28 MED ORDER — DIPHENHYDRAMINE HCL 12.5 MG/5ML PO ELIX: 12.5000 mg | ORAL_SOLUTION | Freq: Four times a day (QID) | ORAL | Status: DC | PRN

## 2017-07-28 MED ORDER — POLYETHYLENE GLYCOL 3350 17 G PO PACK
17.0000 g | PACK | Freq: Every day | ORAL | Status: DC | PRN
Start: 2017-07-28 — End: 2017-08-08
  Administered 2017-07-31: 17 g via ORAL
  Filled 2017-07-28: qty 1

## 2017-07-28 MED ORDER — ADULT MULTIVITAMIN W/MINERALS CH
1.0000 | ORAL_TABLET | Freq: Every day | ORAL | Status: DC
  Administered 2017-07-29 – 2017-08-08 (×11): 1 via ORAL
  Filled 2017-07-28 (×11): qty 1

## 2017-07-28 MED ORDER — PROCHLORPERAZINE EDISYLATE 5 MG/ML IJ SOLN: 5.0000 mg | Freq: Four times a day (QID) | INTRAMUSCULAR | Status: DC | PRN

## 2017-07-28 MED ORDER — PROPOFOL 10 MG/ML IV BOLUS
INTRAVENOUS | Status: DC | PRN
  Administered 2017-07-28: 10 mg via INTRAVENOUS
  Administered 2017-07-28: 20 mg via INTRAVENOUS

## 2017-07-28 MED ORDER — FLEET ENEMA 7-19 GM/118ML RE ENEM: 1.0000 | ENEMA | Freq: Once | RECTAL | Status: DC | PRN

## 2017-07-28 MED ORDER — PROCHLORPERAZINE MALEATE 5 MG PO TABS: 5.0000 mg | ORAL_TABLET | Freq: Four times a day (QID) | ORAL | Status: DC | PRN

## 2017-07-28 MED ORDER — ACETAMINOPHEN 325 MG PO TABS: 325.0000 mg | ORAL_TABLET | ORAL | Status: DC | PRN

## 2017-07-28 MED ORDER — ALUM & MAG HYDROXIDE-SIMETH 200-200-20 MG/5ML PO SUSP: 30.0000 mL | ORAL | Status: DC | PRN

## 2017-07-28 MED ORDER — INSULIN ASPART 100 UNIT/ML ~~LOC~~ SOLN
0.0000 [IU] | Freq: Three times a day (TID) | SUBCUTANEOUS | Status: DC
  Administered 2017-07-29: 7 [IU] via SUBCUTANEOUS
  Administered 2017-07-29: 5 [IU] via SUBCUTANEOUS
  Administered 2017-07-30: 3 [IU] via SUBCUTANEOUS
  Administered 2017-07-30 (×2): 5 [IU] via SUBCUTANEOUS
  Administered 2017-07-31: 3 [IU] via SUBCUTANEOUS
  Administered 2017-07-31: 7 [IU] via SUBCUTANEOUS
  Administered 2017-07-31: 5 [IU] via SUBCUTANEOUS
  Administered 2017-08-01: 2 [IU] via SUBCUTANEOUS
  Administered 2017-08-01: 9 [IU] via SUBCUTANEOUS
  Administered 2017-08-01: 2 [IU] via SUBCUTANEOUS
  Administered 2017-08-02: 1 [IU] via SUBCUTANEOUS
  Administered 2017-08-02: 3 [IU] via SUBCUTANEOUS
  Administered 2017-08-03: 1 [IU] via SUBCUTANEOUS
  Administered 2017-08-04: 2 [IU] via SUBCUTANEOUS
  Administered 2017-08-04: 1 [IU] via SUBCUTANEOUS
  Administered 2017-08-04: 2 [IU] via SUBCUTANEOUS
  Administered 2017-08-05: 1 [IU] via SUBCUTANEOUS
  Administered 2017-08-06 – 2017-08-07 (×2): 2 [IU] via SUBCUTANEOUS
  Administered 2017-08-07: 1 [IU] via SUBCUTANEOUS

## 2017-07-28 MED ORDER — POTASSIUM CHLORIDE CRYS ER 10 MEQ PO TBCR
10.0000 meq | EXTENDED_RELEASE_TABLET | Freq: Every day | ORAL | Status: DC
  Administered 2017-07-29 – 2017-08-08 (×11): 10 meq via ORAL
  Filled 2017-07-28 (×11): qty 1

## 2017-07-28 MED ORDER — MAGNESIUM OXIDE 400 (241.3 MG) MG PO TABS
200.0000 mg | ORAL_TABLET | Freq: Every day | ORAL | Status: DC
  Administered 2017-07-29 – 2017-08-08 (×11): 200 mg via ORAL
  Filled 2017-07-28 (×11): qty 1

## 2017-07-28 MED ORDER — BUTAMBEN-TETRACAINE-BENZOCAINE 2-2-14 % EX AERO
INHALATION_SPRAY | CUTANEOUS | Status: DC | PRN
  Administered 2017-07-28: 2 via TOPICAL

## 2017-07-28 MED ORDER — ATORVASTATIN CALCIUM 40 MG PO TABS
40.0000 mg | ORAL_TABLET | Freq: Every day | ORAL | Status: DC
  Administered 2017-07-29 – 2017-08-07 (×10): 40 mg via ORAL
  Filled 2017-07-28 (×10): qty 1

## 2017-07-28 SURGICAL SUPPLY — 2 items
LOOP REVEAL LINQSYS (Prosthesis & Implant Heart) ×3 IMPLANT
PACK LOOP INSERTION (CUSTOM PROCEDURE TRAY) ×3 IMPLANT

## 2017-07-28 NOTE — Consult Note (Signed)
           [facility] CM Primary Care Navigator  07/28/2017  Earl [name] 1940-12-27 [id number]    Went to see patient at the bedsideto identify possible discharge needs but RNreports that heis off the unit for a procedure (TEE- Transesophageal Echocardiogram) at this time.  Will attemptto see patient at another time when he is available in the room.   Addendum:  Patient will be transferring to [place] after completing procedure.    For questions, please contact:  Dannielle Huh, BSN, RN- [facility] Primary Care Navigator  Telephone: [phone] [place]

## 2017-07-28 NOTE — Progress Notes (Signed)
  Echocardiogram 2D Echocardiogram has been performed.  Earl [name] 07/28/2017, 12:44 PM

## 2017-07-28 NOTE — Progress Notes (Signed)
Patient and family were informed about rehab process including patient safety plan and rehab booklet. 

## 2017-07-28 NOTE — Transfer of Care (Signed)
Immediate Anesthesia Transfer of Care Note  Patient: Earl [name]  Procedure(s) Performed: Procedure(s): TRANSESOPHAGEAL ECHOCARDIOGRAM (TEE) (N/A)  Patient Location: Endoscopy Unit  Anesthesia Type:MAC  Level of Consciousness: drowsy and patient cooperative  Airway & Oxygen Therapy: Patient Spontanous Breathing and Patient connected to nasal cannula oxygen  Post-op Assessment: Report given to RN, Post -op Vital signs reviewed and stable and Patient moving all extremities  Post vital signs: Reviewed and stable  Last Vitals:  Vitals:   07/28/17 1118 07/28/17 1228  BP: (!) 187/97 (!) 142/87  Pulse: 95 100  Resp: 16 (!) 28  Temp:  37.1 C  SpO2: 97% 97%    Last Pain:  Vitals:   07/28/17 1228  TempSrc: Oral  PainSc:          Complications: No apparent anesthesia complications

## 2017-07-28 NOTE — Progress Notes (Signed)
Earl Gong, RN Rehab Admission Coordinator Signed Physical Medicine and Rehabilitation  PMR Pre-admission Date of Service: 07/27/2017 3:58 PM  Related encounter: ED to Hosp-Admission (Current) from 07/25/2017 in [place]       [] Hide copied text PMR Admission Coordinator Pre-Admission Assessment  Patient: Earl [name] is an 76 y.o., male MRN: [id number] DOB: 05-24-1941 Height: 5' 11.5" (181.6 cm) Weight: 82.1 kg (181 lb 1.6 oz)                                                                                                                                                  Insurance Information HMO: yes    PPO:      PCP:      IPA:      80/20:      OTHER: medicare advantage plan PRIMARY: [place] [facility]      Policy#: [id number]      Subscriber: pt CM Name: Earl [name]      Phone#: [phone]     Fax#: [phone] Pre-Cert#: [id number] for 7 days f/u with Vevelyn Royals    Phone [phone] fax [phone]  Employer: retired Benefits:  Phone #: online     Name: 07/27/17 Eff. Date: 11/21/16     Deduct: none      Out of Pocket Max: $4400      Life Max: none CIR: $345 co pay per day days 1-5 then insurance covers 100%      SNF: no co pay days 1-20; $160 co pay per day days 21-48; no co pay days 49-100 Outpatient: $40 co pay per visit     Co-Pay: visits per medical neccesity Home Health: 100%      Co-Pay: visits per medical neccesity DME: 80%     Co-Pay: 20% Providers: pt choice  SECONDARY: none       Medicaid Application Date:       Case Manager:  Disability Application Date:       Case Worker:   Emergency Tax adviser Information    Name Relation Home Work Mobile   [place] R Spouse [phone]       Current Medical History  Patient Admitting Diagnosis: right cerebellar infarct  History of Present Illness:  Earl [name] a 76 y.o.malewith history of HTN, questionable history PAF, T2DM;  who was admitted on 07/25/17 with dizziness, nausea and right sided weakness.  CT head reviewed, showing right cerebellar infarct. Per report, acute right cerebellar SCA infarct. CTA head/neck done revealing right AICA late acute/early subacute infarct, no vascular abnormality. Patient started on ASA for stroke prophylaxis  And TEE ordered for today for workup of embolic stroke.  Total: 0 NIHSS  Past Medical History  Past Medical History:  Diagnosis Date  . Atrial fibrillation ([place])   . BPH (benign  prostatic hyperplasia)   . Diabetes mellitus ([place])   . Hyperlipidemia   . Hypertension   . Other testicular hypofunction   . Type II or unspecified type diabetes mellitus without mention of complication, not stated as uncontrolled     Family History  family history includes Diabetes in his father; Sickle cell anemia in his mother; Stomach cancer in his father.  Prior Rehab/Hospitalizations:  Has the patient had major surgery during 100 days prior to admission? No  Current Medications   Current Facility-Administered Medications:  .  [MAR Hold] acetaminophen (TYLENOL) tablet 650 mg, 650 mg, Oral, Q4H PRN, 325 mg at 07/27/17 1521 **OR** [MAR Hold] acetaminophen (TYLENOL) solution 650 mg, 650 mg, Per Tube, Q4H PRN **OR** [MAR Hold] acetaminophen (TYLENOL) suppository 650 mg, 650 mg, Rectal, Q4H PRN, Emokpae, Ejiroghene E, MD .  Doug Sou Hold] atorvastatin (LIPITOR) tablet 40 mg, 40 mg, Oral, q1800, Emokpae, Ejiroghene E, MD, 40 mg at 07/27/17 1728 .  [MAR Hold] bisacodyl (DULCOLAX) EC tablet 5 mg, 5 mg, Oral, Daily PRN, Opyd, Ilene Qua, MD .  Doug Sou Hold] clopidogrel (PLAVIX) tablet 75 mg, 75 mg, Oral, Daily, Patteson, Samuel A, NP, 75 mg at 07/28/17 0911 .  [MAR Hold] enoxaparin (LOVENOX) injection 40 mg, 40 mg, Subcutaneous, Q24H, Emokpae, Ejiroghene E, MD, 40 mg at 07/28/17 6073 .  [MAR Hold] finasteride (PROSCAR) tablet 5 mg, 5 mg, Oral, Daily, Emokpae, Ejiroghene E, MD, 5 mg at  07/28/17 0911 .  [MAR Hold] gabapentin (NEURONTIN) capsule 300 mg, 300 mg, Oral, BID, Emokpae, Ejiroghene E, MD, 300 mg at 07/28/17 0911 .  [MAR Hold] hydrALAZINE (APRESOLINE) injection 10 mg, 10 mg, Intravenous, Q4H PRN, Emokpae, Ejiroghene E, MD .  Doug Sou Hold] insulin aspart (novoLOG) injection 0-9 Units, 0-9 Units, Subcutaneous, TID WC, Emokpae, Ejiroghene E, MD, 5 Units at 07/28/17 0823 .  [MAR Hold] levothyroxine (SYNTHROID, LEVOTHROID) tablet 25 mcg, 25 mcg, Oral, QAC breakfast, Emokpae, Ejiroghene E, MD, 25 mcg at 07/27/17 0909 .  [MAR Hold] magnesium oxide (MAG-OX) tablet 200 mg, 200 mg, Oral, Daily, Emokpae, Ejiroghene E, MD, 200 mg at 07/28/17 0910 .  [MAR Hold] meclizine (ANTIVERT) tablet 25 mg, 25 mg, Oral, TID, Emokpae, Ejiroghene E, MD, 25 mg at 07/28/17 0911 .  [MAR Hold] potassium chloride (K-DUR,KLOR-CON) CR tablet 10 mEq, 10 mEq, Oral, Daily, Emokpae, Ejiroghene E, MD, 10 mEq at 07/28/17 0907 .  [MAR Hold] senna-docusate (Senokot-S) tablet 1 tablet, 1 tablet, Oral, QHS PRN, Emokpae, Ejiroghene E, MD, 1 tablet at 07/27/17 1600 .  [MAR Hold] sodium phosphate ([name]) 7-19 GM/118ML enema 1 enema, 1 enema, Rectal, Daily PRN, Opyd, Ilene Qua, MD, 1 enema at 07/27/17 2234  Patients Current Diet: Regular diet with thin liquids  Precautions / Restrictions Precautions Precautions: Fall Restrictions Weight Bearing Restrictions: No   Has the patient had 2 or more falls or a fall with injury in the past year?No  Prior Activity Level Community (5-7x/wk): very active and independent ; volunteers, etc. Son states pt is very prideful and modest. Does not like to have others have to assist him in any way for he has always been the one to help others in his family and the community.  He does not like the need for CNAs and nurses having to assist with toilet ing needs. Very modest.  Home Assistive Devices / Equipment Home Assistive Devices/Equipment: CBG Meter, Eyeglasses Home Equipment:  None  Prior Device Use: Indicate devices/aids used by the patient prior to current illness, exacerbation or injury? 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  Independent  Indoor Mobility: Did the patient need assistance with walking from room to room (with or without device)? Independent  Stairs: Did the patient need assistance with internal or external stairs (with or without device)? Independent  Functional Cognition: Did the patient need help planning regular tasks such as shopping or remembering to take medications? Independent  Current Functional Level Cognition  Overall Cognitive Status: Within Functional Limits for tasks assessed Orientation Level: Oriented X4 General Comments: internally distracted by symptoms    Extremity Assessment (includes Sensation/Coordination)  Upper Extremity Assessment: RUE deficits/detail RUE Deficits / Details: decreased coordination finger to nose with R compared to L RUE Coordination: decreased fine motor, decreased gross motor  Lower Extremity Assessment: RLE deficits/detail, LLE deficits/detail RLE Deficits / Details: AROM and strength WFL, decreased coordination with heel to shin LLE Deficits / Details: WFL    ADLs  Overall ADL's : Needs assistance/impaired Eating/Feeding: Set up Grooming: Sitting, Minimal assistance Grooming Details (indicate cue type and reason): Difficulty completing ADL unsupported - falls L Upper Body Bathing: Minimal assistance, Sitting Lower Body Bathing: Moderate assistance, Sit to/from stand Upper Body Dressing : Moderate assistance, Sitting Lower Body Dressing: Moderate assistance, Sit to/from stand Toilet Transfer: Moderate assistance, RW, Stand-pivot (+2 would be  helpful) Toilet Transfer Details (indicate cue type and reason): simulated Toileting- Clothing Manipulation and Hygiene: Moderate assistance Toileting - Clothing Manipulation Details (indicate cue type and reason): unable to release RW and manipulate clothing Functional mobility during ADLs: Moderate assistance, +2 for safety/equipment, Rolling walker, Cueing for safety, Cueing for sequencing General ADL Comments: Pt falls toward L when unsupported sitting EOB. motivated to complete ADL independently.    Mobility  Overal bed mobility: Needs Assistance Bed Mobility: Supine to Sit Supine to sit: Min guard Sit to supine: Min assist General bed mobility comments: assist for balance    Transfers  Overall transfer level: Needs assistance Equipment used: Rolling walker (2 wheeled) Transfers: Sit to/from Stand Sit to Stand: Min assist Stand pivot transfers: Mod assist, +2 safety/equipment General transfer comment: cues for hand placement, steadying assist     Ambulation / Gait / Stairs / Wheelchair Mobility  Ambulation/Gait Ambulation/Gait assistance: Min assist, Mod assist Ambulation Distance (Feet): 125 Feet Assistive device: Rolling walker (2 wheeled) Gait Pattern/deviations: Step-to pattern, Step-through pattern, Decreased stride length, Narrow base of support, Ataxic General Gait Details: cues to right head due to leaning L, cues throughout for visual orientation to upright, assist for balance, step width    Posture / Balance Dynamic Sitting Balance Sitting balance - Comments: initially reliant on hands for balance and occasional cues for R lateral lean, but hands in lap for testing and no LOB Balance Overall balance assessment: Needs assistance Sitting-balance support: Feet supported, No upper extremity supported Sitting balance-Leahy Scale: Fair Sitting balance - Comments: initially reliant on hands for balance and occasional cues for R lateral lean, but hands in lap for  testing and no LOB Postural control: Left lateral lean Standing balance support: Bilateral upper extremity supported Standing balance-Leahy Scale: Poor Standing balance comment: UE support and cues/assist for balance in standing High Level Balance Comments: sidestepping, forward marching at wall rail bilateral UE support    Special needs/care consideration BiPAP/CPAP  N/a CPM  N/a Continuous Drip IV  N/a Dialysis  N/a Life Vest  N/a Oxygen n/a Special Bed  N/a Trach Size  N/a Wound Vac   N/a Skin  intact Bowel mgmt: continent LBM  07/27/17 after fleets enema, suppository and stool softners Bladder mgmt: continent Diabetic mgmt yes pta Hgb A1c 6.6 Possible new diagnosis of atrial fibrillation Patient does not like the need for bed and chair alarms that acute hospital has been using on him.   Previous Home Environment Living Arrangements: Spouse/significant other (daughter in [place], son in [place])  Lives With: Spouse Available Help at Discharge: Family, Available 24 hours/day Type of Home: [place]: One level, Multi-level Alternate Level Stairs-Rails: None Alternate Level Stairs-Number of Steps:  (through garage; son to have rails installed before pt discha) Home Access: Stairs to enter Entrance Stairs-Rails: None Entrance Stairs-Number of Steps:  (front; 2 steps through garage) Bathroom Shower/Tub: Tub/shower unit, Charity fundraiser: Standard Bathroom Accessibility: Yes How Accessible: Accessible via walker [place]: No  Discharge Living Setting Plans for Discharge Living Setting: Patient's home, Lives with (comment) (spouse) Type of Home at Discharge: House Discharge Home Layout: One level Discharge Home Access: Stairs to enter Entrance Stairs-Rails: None Entrance Stairs-Number of Steps: 2 steps though garage; 4 steps front door (son having rails installed ) Discharge Bathroom Shower/Tub: Tub/shower unit, Door Discharge Bathroom Toilet:  Standard Discharge Bathroom Accessibility: Yes How Accessible: Accessible via walker Does the patient have any problems obtaining your medications?: No  Social/Family/Support Systems Patient Roles: Spouse, Parent, Psychologist, occupational (very activity in the community) Sport and exercise psychologist Information: [name], wife Anticipated Caregiver: wife Anticipated Ambulance person Information: see above Ability/Limitations of Caregiver: wife completed chemo and radiation 2 months ago Caregiver Availability: 24/7 Discharge Plan Discussed with Primary Caregiver: Yes Is Caregiver In Agreement with Plan?: Yes Does Caregiver/Family have Issues with Lodging/Transportation while Pt is in Rehab?: No  Goals/Additional Needs Patient/Family Goal for Rehab: supervision with PT and OT Expected length of stay: ELOS 8- 13days Pt/Family Agrees to Admission and willing to participate: Yes Program Orientation Provided & Reviewed with Pt/Caregiver Including Roles  & Responsibilities: Yes  Decrease burden of Care through IP rehab admission: n/a  Possible need for SNF placement upon discharge:not anticipated  Patient Condition: This patient's condition remains as documented in the consult dated 07/26/17, in which the Rehabilitation Physician determined and documented that the patient's condition is appropriate for intensive rehabilitative care in an inpatient rehabilitation facility. Will admit to inpatient rehab today.  Preadmission Screen Completed By:  Cleatrice Burke, 07/28/2017 11:05 AM ______________________________________________________________________   Discussed status with Dr. Naaman Plummer on 07/28/2017 at  1105 and received telephone approval for admission today.  Admission Coordinator:  Cleatrice Burke, time 4650 Date 07/28/2017       Cosigned by: Meredith Staggers, MD at 07/28/2017 11:47 AM  Revision History

## 2017-07-28 NOTE — Progress Notes (Signed)
Physical Medicine and Rehabilitation Consult  Reason for Consult:  Stroke with dizziness, Nausea and vomiting with limitations in mobility and self care tasks.   Referring Physician: Dr. Ree Kida   HPI: Earl [name] is a 76 y.o. male with history of  HTN, questionable history PAF, T2DM; who was admitted on 07/25/17 with dizziness, nausea and right sided weakness. History taken from chart review and patient.  CT head reviewed, showing right cerebellar infarct. Per report, acute right cerebellar SCA infarct. CTA head/neck done revealing right AICA late acute/early subacute infarct, no vascular abnormality. Patient started on ASA for stroke prophylaxis and work up ongoing to help determine etiology. Therapy evaluations done revealing limitations in mobility and self care tasks. CIR recommended for follow up therapy.   Review of Systems  Constitutional: Negative for chills and fever.  HENT: Negative for hearing loss and tinnitus.   Eyes: Negative for blurred vision and double vision.  Respiratory: Negative for cough and hemoptysis.   Cardiovascular: Negative for chest pain, palpitations and leg swelling.  Gastrointestinal: Positive for nausea. Negative for constipation and vomiting.  Genitourinary: Negative for dysuria and urgency.  Musculoskeletal: Negative for back pain, joint pain, myalgias and neck pain.  Skin: Negative for itching and rash.  Neurological: Positive for dizziness. Negative for headaches.  Psychiatric/Behavioral: Negative for memory loss. The patient is not nervous/anxious.   All other systems reviewed and are negative.       Past Medical History:  Diagnosis Date  . Atrial fibrillation ([place])   . BPH (benign prostatic hyperplasia)   . Diabetes mellitus ([place])   . Hyperlipidemia   . Hypertension   . Other testicular hypofunction   . Type II or unspecified type diabetes mellitus without mention of complication, not stated as uncontrolled          Past  Surgical History:  Procedure Laterality Date  . INGUINAL HERNIA REPAIR     1968 and 2004         Family History  Problem Relation Age of Onset  . Sickle cell anemia Mother   . Diabetes Father   . Stomach cancer Father   . Colon cancer Neg Hx   . CAD Neg Hx     Social History:  Married. Retired--management in retail. Wife completed chemo a month ago--can provide supervision after discharge. Independent and active PTA. He reports that he has never smoked. He has never used smokeless tobacco. He reports that he does not drink alcohol or use drugs.         Allergies  Allergen Reactions  . Penicillins Other (See Comments)    Was told to "no take this" Has patient had a PCN reaction causing immediate rash, facial/tongue/throat swelling, SOB or lightheadedness with hypotension: Unknown Has patient had a PCN reaction causing severe rash involving mucus membranes or skin necrosis: Unknown Has patient had a PCN reaction that required hospitalization: Unknown Has patient had a PCN reaction occurring within the last 10 years: No If all of the above answers are "NO", then may proceed with Cephalosporin use.           Medications Prior to Admission  Medication Sig Dispense Refill  . aspirin 81 MG tablet Take 81 mg by mouth daily.    [name] atenolol (TENORMIN) 100 MG tablet TAKE 1 TABLET BY MOUTH  DAILY (Patient taking differently: Take 100 mg by mouth once a day) 90 tablet 1  . bisoprolol-hydrochlorothiazide (ZIAC) 10-6.25 MG tablet Take 1 tablet by mouth daily. 90 tablet 1  .  Cholecalciferol (VITAMIN D PO) Take 5,000 Units by mouth daily.    . Cyanocobalamin (B-12) 50 MCG TABS Take 50 mcg by mouth daily.    . finasteride (PROSCAR) 5 MG tablet TAKE 1 TABLET BY MOUTH  EVERY DAY FOR PROSTATE (Patient taking differently: Take 2.5 mg by mouth at bedtime) 90 tablet 1  . gabapentin (NEURONTIN) 300 MG capsule TAKE 1 TO 2 CAPSULES BY  MOUTH AT NIGHT FOR PAIN (Patient taking  differently: Take 300-600 mg by mouth at bedtime for pain) 180 capsule 1  . glipiZIDE (GLUCOTROL) 5 MG tablet TAKE 1 TABLET BY MOUTH 3  TIMES DAILY BEFORE MEALS  FOR DIABETES (Patient taking differently: Take 5 mg by mouth three times a day before meals for diabetes) 270 tablet 1  . levothyroxine (SYNTHROID, LEVOTHROID) 50 MCG tablet TAKE 1 TABLET BY MOUTH  DAILY BEFORE BREAKFAST (Patient taking differently: Take 25 mcg by mouth in the morning before breakfast) 90 tablet 1  . Magnesium 250 MG TABS Take 250 mg by mouth daily.    . metFORMIN (GLUCOPHAGE-XR) 500 MG 24 hr tablet TAKE 1 TABLET BY MOUTH 4  TIMES DAILY FOR DIABETES (Patient taking differently: Take 500 mg by mouth four times a day for diabetes) 360 tablet 1  . Multiple Vitamin (MULTIVITAMIN) capsule Take 1 capsule by mouth daily.      . potassium chloride (KLOR-CON) 10 MEQ CR tablet Take 10 mEq by mouth daily.      . quinapril (ACCUPRIL) 40 MG tablet TAKE 1 TABLET BY MOUTH  EVERY DAY FOR BLOOD  PRESSURE (Patient taking differently: Take 40 mg by mouth once a day for blood pressure) 90 tablet 1  . simvastatin (ZOCOR) 40 MG tablet TAKE 1 TABLET BY MOUTH AT  BEDTIME OR AS DIRECTED FOR  CHOLESTEROL (Patient taking differently: Take 20 mg by mouth in the evening on Mon/Wed/Fri) 90 tablet 1  . vitamin C (ASCORBIC ACID) 500 MG tablet Take 500 mg by mouth daily.        Home: Home Living Family/patient expects to be discharged to:: Private residence Living Arrangements: Spouse/significant other, Children Available Help at Discharge: Family, Available 24 hours/day Type of Home: House Home Access: Stairs to enter [employer] of Steps: 4 Entrance Stairs-Rails: None Home Layout: One level, Multi-level Alternate Level Stairs-Number of Steps: 2 Alternate Level Stairs-Rails: None Bathroom Shower/Tub: Tub/shower unit, Door [employer] Toilet: Standard Bathroom Accessibility: Yes Home Equipment: None  Functional History: Prior  Function Level of Independence: Independent Comments: drives; retired; 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 Transfers Sit to Stand: Mod assist Stand pivot transfers: Mod assist, +2 safety/equipment General transfer comment: midline oreintation displaced to L. Educated on fixating visually on target during mobility. Pt states this helps.   ADL: ADL Overall ADL's : Needs assistance/impaired Eating/Feeding: Set up Grooming: Sitting, Minimal assistance Grooming Details (indicate cue type and reason): Difficulty completing ADL unsupported - falls L Upper Body Bathing: Minimal assistance, Sitting Lower Body Bathing: Moderate assistance, Sit to/from stand Upper Body Dressing : Moderate assistance, Sitting Lower Body Dressing: Moderate assistance, Sit to/from stand Toilet Transfer: Moderate assistance, RW, Stand-pivot (+2 would be helpful) Armed forces technical officer  Details (indicate cue type and reason): simulated Toileting- Clothing Manipulation and Hygiene: Moderate assistance Toileting - Clothing Manipulation Details (indicate cue type and reason): unable to release RW and manipulate clothing Functional mobility during ADLs: Moderate assistance, +2 for safety/equipment, Rolling walker, Cueing for safety, Cueing for sequencing General ADL Comments: Pt falls toward L when unsupported sitting EOB. motivated to complete ADL independently.  Cognition: Cognition Overall Cognitive Status: Within Functional Limits for tasks assessed Orientation Level: Oriented  X4 Cognition Arousal/Alertness: Awake/alert Behavior During Therapy: Flat affect Overall Cognitive Status: Within Functional Limits for tasks assessed General Comments: Will further assess cognition.   Blood pressure (!) 173/82, pulse 74, temperature 98.3 F (36.8 C), temperature source Oral, resp. rate 20, height 5' 11.5" (1.816 m), weight 82.1 kg (181 lb 1.6 oz), SpO2 97 %. Physical Exam  Nursing note and vitals reviewed. Constitutional: He is oriented to person, place, and time. He appears well-developed and well-nourished.  HENT:  Head: Normocephalic and atraumatic.  Eyes: Pupils are equal, round, and reactive to light. Conjunctivae and EOM are normal.  Neck: Normal range of motion. Neck supple.  Cardiovascular: Normal rate and regular rhythm.   Respiratory: Effort normal and breath sounds normal. No stridor. No respiratory distress. He has no wheezes.  GI: Soft. Bowel sounds are normal. He exhibits no distension. There is no tenderness.  Musculoskeletal: He exhibits no edema or tenderness.  Neurological: He is alert and oriented to person, place, and time.  Tends to keep eyes closed due to dizziness.  Horizontal nystagmus Left >right.  Speech clear.  Able to follow basic one and two step commands without difficulty.  LUE ataxia and dysmetria with finger to nose testing.  Motor: 5/5 throughout Sensation intact to light touch DTRs symmetric  Skin: Skin is warm and dry.  Psychiatric: He has a normal mood and affect. His behavior is normal. Judgment and thought content normal.    Lab Results Last 24 Hours       Results for orders placed or performed during the hospital encounter of 07/25/17 (from the past 24 hour(s))  CBG monitoring, ED     Status: Abnormal   Collection Time: 07/25/17  5:18 PM  Result Value Ref Range   Glucose-Capillary 310 (H) 65 - 99 mg/dL  Ethanol     Status: None   Collection Time: 07/25/17  5:20 PM  Result Value Ref Range   Alcohol, Ethyl (B)  <5 <5 mg/dL  Protime-INR     Status: None   Collection Time: 07/25/17  5:20 PM  Result Value Ref Range   Prothrombin Time 13.0 11.4 - 15.2 seconds   INR 0.99   APTT     Status: Abnormal   Collection Time: 07/25/17  5:20 PM  Result Value Ref Range   aPTT 22 (L) 24 - 36 seconds  CBC     Status: Abnormal   Collection Time: 07/25/17  5:20 PM  Result Value Ref Range   WBC 8.5 4.0 - 10.5 K/uL   RBC 4.59 4.22 - 5.81 MIL/uL   Hemoglobin 13.2 13.0 - 17.0 g/dL   HCT 38.2 (L) 39.0 - 52.0 %   MCV 83.2 78.0 - 100.0 fL   MCH 28.8 26.0 - 34.0 pg   MCHC 34.6 30.0 - 36.0 g/dL   RDW 13.5 11.5 - 15.5 %   Platelets 198 150 - 400 K/uL  Differential     Status: None   Collection Time: 07/25/17  5:20 PM  Result Value Ref Range  Neutrophils Relative % 81 %   Neutro Abs 6.9 1.7 - 7.7 K/uL   Lymphocytes Relative 12 %   Lymphs Abs 1.0 0.7 - 4.0 K/uL   Monocytes Relative 7 %   Monocytes Absolute 0.6 0.1 - 1.0 K/uL   Eosinophils Relative 0 %   Eosinophils Absolute 0.0 0.0 - 0.7 K/uL   Basophils Relative 0 %   Basophils Absolute 0.0 0.0 - 0.1 K/uL  Comprehensive metabolic panel     Status: Abnormal   Collection Time: 07/25/17  5:20 PM  Result Value Ref Range   Sodium 139 135 - 145 mmol/L   Potassium 4.2 3.5 - 5.1 mmol/L   Chloride 106 101 - 111 mmol/L   CO2 22 22 - 32 mmol/L   Glucose, Bld 330 (H) 65 - 99 mg/dL   BUN 14 6 - 20 mg/dL   Creatinine, Ser 1.16 0.61 - 1.24 mg/dL   Calcium 9.0 8.9 - 10.3 mg/dL   Total Protein 6.2 (L) 6.5 - 8.1 g/dL   Albumin 3.7 3.5 - 5.0 g/dL   AST 24 15 - 41 U/L   ALT 19 17 - 63 U/L   Alkaline Phosphatase 65 38 - 126 U/L   Total Bilirubin 0.7 0.3 - 1.2 mg/dL   GFR calc non Af Amer 59 (L) >60 mL/min   GFR calc Af Amer >60 >60 mL/min   Anion gap 11 5 - 15  I-stat troponin, ED     Status: None   Collection Time: 07/25/17  5:30 PM  Result Value Ref Range   Troponin i, poc 0.03 0.00 - 0.08 ng/mL   Comment 3           I-Stat Chem 8, ED     Status: Abnormal   Collection Time: 07/25/17  5:32 PM  Result Value Ref Range   Sodium 139 135 - 145 mmol/L   Potassium 4.2 3.5 - 5.1 mmol/L   Chloride 105 101 - 111 mmol/L   BUN 16 6 - 20 mg/dL   Creatinine, Ser 1.00 0.61 - 1.24 mg/dL   Glucose, Bld 331 (H) 65 - 99 mg/dL   Calcium, Ion 1.07 (L) 1.15 - 1.40 mmol/L   TCO2 22 22 - 32 mmol/L   Hemoglobin 13.6 13.0 - 17.0 g/dL   HCT 40.0 39.0 - 52.0 %  Urine rapid drug screen (hosp performed)     Status: None   Collection Time: 07/25/17  7:11 PM  Result Value Ref Range   Opiates NONE DETECTED NONE DETECTED   Cocaine NONE DETECTED NONE DETECTED   Benzodiazepines NONE DETECTED NONE DETECTED   Amphetamines NONE DETECTED NONE DETECTED   Tetrahydrocannabinol NONE DETECTED NONE DETECTED   Barbiturates NONE DETECTED NONE DETECTED  Urinalysis, Routine w reflex microscopic     Status: Abnormal   Collection Time: 07/25/17  7:11 PM  Result Value Ref Range   Color, Urine YELLOW YELLOW   APPearance CLEAR CLEAR   Specific Gravity, Urine 1.014 1.005 - 1.030   pH 6.0 5.0 - 8.0   Glucose, UA >=500 (A) NEGATIVE mg/dL   Hgb urine dipstick NEGATIVE NEGATIVE   Bilirubin Urine NEGATIVE NEGATIVE   Ketones, ur 20 (A) NEGATIVE mg/dL   Protein, ur 30 (A) NEGATIVE mg/dL   Nitrite NEGATIVE NEGATIVE   Leukocytes, UA NEGATIVE NEGATIVE   RBC / HPF 0-5 0 - 5 RBC/hpf   WBC, UA 0-5 0 - 5 WBC/hpf   Bacteria, UA NONE SEEN NONE SEEN   Squamous Epithelial / LPF  NONE SEEN NONE SEEN  Lipid panel     Status: None   Collection Time: 07/26/17  5:51 AM  Result Value Ref Range   Cholesterol 147 0 - 200 mg/dL   Triglycerides 45 <150 mg/dL   HDL 51 >40 mg/dL   Total CHOL/HDL Ratio 2.9 RATIO   VLDL 9 0 - 40 mg/dL   LDL Cholesterol 87 0 - 99 mg/dL  Glucose, capillary     Status: Abnormal   Collection Time: 07/26/17  6:58 AM  Result Value Ref Range   Glucose-Capillary 303 (H) 65 - 99 mg/dL  Glucose,  capillary     Status: Abnormal   Collection Time: 07/26/17 12:54 PM  Result Value Ref Range   Glucose-Capillary 268 (H) 65 - 99 mg/dL      Imaging Results (Last 48 hours)  Ct Angio Head W Or Wo Contrast  Result Date: 07/26/2017 CLINICAL DATA:  76 y/o M; dizziness and nausea with vomiting. Evaluation for stroke. EXAM: CT ANGIOGRAPHY HEAD AND NECK TECHNIQUE: Multidetector CT imaging of the head and neck was performed using the standard protocol during bolus administration of intravenous contrast. Multiplanar CT image reconstructions and MIPs were obtained to evaluate the vascular anatomy. Carotid stenosis measurements (when applicable) are obtained utilizing NASCET criteria, using the distal internal carotid diameter as the denominator. CONTRAST:  50 cc Isovue 370 COMPARISON:  07/25/2017 CT head, 04/29/2013 thyroid ultrasound FINDINGS: CTA NECK FINDINGS Aortic arch: Standard branching. Imaged portion shows no evidence of aneurysm or dissection. No significant stenosis of the major arch vessel origins. Mild calcific atherosclerosis. Right carotid system: No evidence of dissection, stenosis (50% or greater) or occlusion. Left carotid system: No evidence of dissection, stenosis (50% or greater) or occlusion. Minimal calcific atherosclerosis of the carotid bifurcation. Vertebral arteries: Codominant. No evidence of dissection, stenosis (50% or greater) or occlusion. Skeleton: Mild cervical spondylosis with disc facet degenerative changes greatest in the upper cervical spine. L3-4 small central disc protrusion. No high-grade bony canal or foraminal stenosis. Other neck: Lipoma measuring 23 x 51 x 53 mm (AP x ML x CC series 6: Image 263, 8:102) within strap muscles anterior to larynx. Left lobe of thyroid nodule measuring 41 x 48 x 53 mm. Upper chest: Negative peer Review of the MIP images confirms the above findings CTA HEAD FINDINGS Anterior circulation: No significant stenosis, proximal occlusion, aneurysm,  or vascular malformation. Mild non stenotic calcific atherosclerosis of carotid siphons. Posterior circulation: No significant stenosis, proximal occlusion, aneurysm, or vascular malformation. A diminutive right AICA branches identified. Venous sinuses: As permitted by contrast timing, patent. Anatomic variants: Anterior and bilateral posterior communicating arteries are present. Large left A1, small right A1, and large anterior communicating artery, normal variant. Delayed phase: Right PICA distribution hypoattenuation with loss of gray-white differentiation compatible with late acute to subacute infarction. No abnormal enhancement of the brain. Review of the MIP images confirms the above findings IMPRESSION: 1. Patent carotid and vertebral arteries. No dissection, aneurysm, or hemodynamically significant stenosis utilizing NASCET criteria. 2. Patent circle of Willis. No large vessel occlusion, aneurysm, or significant stenosis. 3. Right AICA distribution late acute/early subacute infarction. 4. No hemorrhage or abnormal enhancement in the brain. 5. Lipoma within strap muscles anterior to larynx. 6. Large nodule in left lobe of thyroid, stable from prior thyroid ultrasounds. These results were called by telephone at the time of interpretation on 07/26/2017 at 1:11 am to Dr. Roland Rack , who verbally acknowledged these results. Electronically Signed   By: Kristine Garbe  M.D.   On: 07/26/2017 01:14   Ct Head Wo Contrast  Result Date: 07/25/2017 CLINICAL DATA:  Weakness and ataxia. EXAM: CT HEAD WITHOUT CONTRAST TECHNIQUE: Contiguous axial images were obtained from the base of the skull through the vertex without intravenous contrast. COMPARISON:  None. FINDINGS: Brain: There is a moderate-sized, wedge-shaped region of edema in the right cerebellum consistent with acute SCA territory infarct. No intracranial hemorrhage, mass, midline shift, or extra-axial fluid collection is identified. Mild  cerebral atrophy is within normal limits for age. Vascular: Carotid siphon atherosclerosis.  No hyperdense vessel. Skull: No fracture focal osseous lesion. Sinuses/Orbits: Visualized paranasal sinuses and mastoid air cells are clear. Bilateral cataract extraction is noted. Other: None. IMPRESSION: 1. Acute right cerebellar SCA territory infarct. 2. No intracranial hemorrhage. Electronically Signed   By: Logan Bores M.D.   On: 07/25/2017 17:53   Ct Angio Neck W Or Wo Contrast  Result Date: 07/26/2017 CLINICAL DATA:  76 y/o M; dizziness and nausea with vomiting. Evaluation for stroke. EXAM: CT ANGIOGRAPHY HEAD AND NECK TECHNIQUE: Multidetector CT imaging of the head and neck was performed using the standard protocol during bolus administration of intravenous contrast. Multiplanar CT image reconstructions and MIPs were obtained to evaluate the vascular anatomy. Carotid stenosis measurements (when applicable) are obtained utilizing NASCET criteria, using the distal internal carotid diameter as the denominator. CONTRAST:  50 cc Isovue 370 COMPARISON:  07/25/2017 CT head, 04/29/2013 thyroid ultrasound FINDINGS: CTA NECK FINDINGS Aortic arch: Standard branching. Imaged portion shows no evidence of aneurysm or dissection. No significant stenosis of the major arch vessel origins. Mild calcific atherosclerosis. Right carotid system: No evidence of dissection, stenosis (50% or greater) or occlusion. Left carotid system: No evidence of dissection, stenosis (50% or greater) or occlusion. Minimal calcific atherosclerosis of the carotid bifurcation. Vertebral arteries: Codominant. No evidence of dissection, stenosis (50% or greater) or occlusion. Skeleton: Mild cervical spondylosis with disc facet degenerative changes greatest in the upper cervical spine. L3-4 small central disc protrusion. No high-grade bony canal or foraminal stenosis. Other neck: Lipoma measuring 23 x 51 x 53 mm (AP x ML x CC series 6: Image 263, 8:102)  within strap muscles anterior to larynx. Left lobe of thyroid nodule measuring 41 x 48 x 53 mm. Upper chest: Negative peer Review of the MIP images confirms the above findings CTA HEAD FINDINGS Anterior circulation: No significant stenosis, proximal occlusion, aneurysm, or vascular malformation. Mild non stenotic calcific atherosclerosis of carotid siphons. Posterior circulation: No significant stenosis, proximal occlusion, aneurysm, or vascular malformation. A diminutive right AICA branches identified. Venous sinuses: As permitted by contrast timing, patent. Anatomic variants: Anterior and bilateral posterior communicating arteries are present. Large left A1, small right A1, and large anterior communicating artery, normal variant. Delayed phase: Right PICA distribution hypoattenuation with loss of gray-white differentiation compatible with late acute to subacute infarction. No abnormal enhancement of the brain. Review of the MIP images confirms the above findings IMPRESSION: 1. Patent carotid and vertebral arteries. No dissection, aneurysm, or hemodynamically significant stenosis utilizing NASCET criteria. 2. Patent circle of Willis. No large vessel occlusion, aneurysm, or significant stenosis. 3. Right AICA distribution late acute/early subacute infarction. 4. No hemorrhage or abnormal enhancement in the brain. 5. Lipoma within strap muscles anterior to larynx. 6. Large nodule in left lobe of thyroid, stable from prior thyroid ultrasounds. These results were called by telephone at the time of interpretation on 07/26/2017 at 1:11 am to Dr. Roland Rack , who verbally acknowledged these results. Electronically Signed  By: Kristine Garbe M.D.   On: 07/26/2017 01:14   Mr Brain Wo Contrast  Result Date: 07/26/2017 CLINICAL DATA:  Initial evaluation for acute stroke. EXAM: MRI HEAD WITHOUT CONTRAST TECHNIQUE: Multiplanar, multiecho pulse sequences of the brain and surrounding structures were  obtained without intravenous contrast. COMPARISON:  Comparison made with prior CTs from 07/25/2017 as well as 07/26/2017. FINDINGS: Brain: Age-appropriate cerebral atrophy. No significant cerebral white matter for age. Confluent restricted diffusion involving the superior right cerebellar hemisphere, compatible with acute ischemic infarct, right superior cerebral artery territory. No significant mass effect. Scattered petechial hemorrhage without evidence for frank hemorrhagic transformation (series 9, image 24). No other acute infarct. Gray-white matter differentiation otherwise maintained. No other evidence for acute or chronic intracranial hemorrhage. No mass lesion, midline shift or mass effect. No hydrocephalus. No extra-axial fluid collection. Vascular: Major intracranial vascular flow voids are maintained. Skull and upper cervical spine: Craniocervical junction normal. Visualized upper cervical spine unremarkable. Bone marrow signal intensity within normal limits. No scalp soft tissue abnormality. Sinuses/Orbits: Globes and orbital soft tissues within normal limits. Patient status post lens extraction bilaterally. Paranasal sinuses are clear. Trace right mastoid effusion noted. Inner ear structures normal. Other: None. IMPRESSION: 1. Acute ischemic superior right cerebellar infarct without significant mass effect. Associated mild petechial hemorrhage without frank hemorrhagic transformation. 2. Otherwise normal brain MRI. Electronically Signed   By: Jeannine Boga M.D.   On: 07/26/2017 12:57    Assessment/Plan: Diagnosis: Right cerebellar infarct Labs and images independently reviewed.  Records reviewed and summated above. Stroke: Continue secondary stroke prophylaxis and Risk Factor Modification listed below:   Antiplatelet therapy:   Blood Pressure Management:  Continue current medication with prn's with permisive HTN per primary team Statin Agent:   Diabetes management:    1. Does the  need for close, 24 hr/day medical supervision in concert with the patient's rehab needs make it unreasonable for this patient to be served in a less intensive setting? Yes  2. Co-Morbidities requiring supervision/potential complications: HTN (monitor and provide prns in accordance with increased physical exertion and pain), questionable history PAF (monitor HR with increased physical acitivty), T2 DM (Monitor in accordance with exercise and adjust meds as necessary), BPH (monitor), CKD (avoid nephrotoxic meds) 3. Due to safety, disease management and patient education, does the patient require 24 hr/day rehab nursing? Yes 4. Does the patient require coordinated care of a physician, rehab nurse, PT (1-2 hrs/day, 5 days/week) and OT (1-2 hrs/day, 5 days/week) to address physical and functional deficits in the context of the above medical diagnosis(es)? Yes Addressing deficits in the following areas: balance, endurance, locomotion, transferring, bathing, dressing, toileting and psychosocial support 5. Can the patient actively participate in an intensive therapy program of at least 3 hrs of therapy per day at least 5 days per week? Yes 6. The potential for patient to make measurable gains while on inpatient rehab is excellent 7. Anticipated functional outcomes upon discharge from inpatient rehab are supervision  with PT, supervision with OT, n/a with SLP. 8. Estimated rehab length of stay to reach the above functional goals is: 8-13 days. 9. Anticipated D/C setting: Home 10. Anticipated post D/C treatments: HH therapy and Home excercise program 11. Overall Rehab/Functional Prognosis: good  RECOMMENDATIONS: This patient's condition is appropriate for continued rehabilitative care in the following setting: CIR Patient has agreed to participate in recommended program. Yes Note that insurance prior authorization may be required for reimbursement for recommended care.  Comment: Rehab Admissions  Coordinator to follow  up.  Delice Lesch, MD, [place], [place] 07/26/2017

## 2017-07-28 NOTE — Consult Note (Signed)
ELECTROPHYSIOLOGY CONSULT NOTE  Patient ID: Earl [name] MRN: [id number], DOB/AGE: February 27, 1941   Admit date: 07/25/2017 Date of Consult: 07/28/2017  Primary Physician: [name], MD Primary Cardiologist: [facility] Reason for Consultation: Cryptogenic stroke; recommendations regarding Implantable Loop Recorder  History of Present Illness  EP has been asked to evaluate Earl [name] for placement of an implantable loop recorder to monitor for atrial fibrillation by Dr Leonie Man.  The patient was admitted on 07/25/2017 with R sided weakness.  They first developed symptoms while at home.  Imaging demonstrated R cerebellar stroke.  he has undergone workup for stroke including echocardiogram and carotid dopplers.  The patient has been monitored on telemetry which has demonstrated sinus rhythm with no arrhythmias.  Inpatient stroke work-up is to be completed with a TEE.   Echocardiogram this admission demonstrated normalLV function without LAE.  Lab work is reviewed.  Prior to admission, the patient denies chest pain, shortness of breath, dizziness, palpitations, or syncope.  They are recovering from their stroke with plans to CIR at discharge.  He has hx of questionable Afib for which he saw Dr Elder Love 2013 but nothing was confirmed He denies palpitations  Past Medical History:  Diagnosis Date  . Atrial fibrillation ([place])   . BPH (benign prostatic hyperplasia)   . Diabetes mellitus ([place])   . Hyperlipidemia   . Hypertension   . Other testicular hypofunction   . Type II or unspecified type diabetes mellitus without mention of complication, not stated as uncontrolled      Surgical History:  Past Surgical History:  Procedure Laterality Date  . [place] and 2004     Prescriptions Prior to Admission  Medication Sig Dispense Refill Last Dose  . aspirin 81 MG tablet Take 81 mg by mouth daily.   07/24/2017 at 0900  . atenolol (TENORMIN) 100 MG tablet TAKE 1 TABLET BY  MOUTH  DAILY (Patient taking differently: Take 100 mg by mouth once a day) 90 tablet 1 07/24/2017 at am  . bisoprolol-hydrochlorothiazide (ZIAC) 10-6.25 MG tablet Take 1 tablet by mouth daily. 90 tablet 1 07/24/2017 at Unknown time  . Cholecalciferol (VITAMIN D PO) Take 5,000 Units by mouth daily.   07/24/2017 at am  . Cyanocobalamin (B-12) 50 MCG TABS Take 50 mcg by mouth daily.   07/24/2017 at Unknown time  . finasteride (PROSCAR) 5 MG tablet TAKE 1 TABLET BY MOUTH  EVERY DAY FOR PROSTATE (Patient taking differently: Take 2.5 mg by mouth at bedtime) 90 tablet 1 07/24/2017 at pm  . gabapentin (NEURONTIN) 300 MG capsule TAKE 1 TO 2 CAPSULES BY  MOUTH AT NIGHT FOR PAIN (Patient taking differently: Take 300-600 mg by mouth at bedtime for pain) 180 capsule 1 07/24/2017 at pm  . glipiZIDE (GLUCOTROL) 5 MG tablet TAKE 1 TABLET BY MOUTH 3  TIMES DAILY BEFORE MEALS  FOR DIABETES (Patient taking differently: Take 5 mg by mouth three times a day before meals for diabetes) 270 tablet 1 07/24/2017 at pm  . levothyroxine (SYNTHROID, LEVOTHROID) 50 MCG tablet TAKE 1 TABLET BY MOUTH  DAILY BEFORE BREAKFAST (Patient taking differently: Take 25 mcg by mouth in the morning before breakfast) 90 tablet 1 07/24/2017 at am  . Magnesium 250 MG TABS Take 250 mg by mouth daily.   07/24/2017 at Unknown time  . metFORMIN (GLUCOPHAGE-XR) 500 MG 24 hr tablet TAKE 1 TABLET BY MOUTH 4  TIMES DAILY FOR DIABETES (Patient taking differently: Take 500 mg by mouth four  times a day for diabetes) 360 tablet 1 07/24/2017 at pm  . Multiple Vitamin (MULTIVITAMIN) capsule Take 1 capsule by mouth daily.     07/24/2017 at Unknown time  . potassium chloride (KLOR-CON) 10 MEQ CR tablet Take 10 mEq by mouth daily.     07/24/2017 at am  . quinapril (ACCUPRIL) 40 MG tablet TAKE 1 TABLET BY MOUTH  EVERY DAY FOR BLOOD  PRESSURE (Patient taking differently: Take 40 mg by mouth once a day for blood pressure) 90 tablet 1 07/24/2017 at am  . simvastatin (ZOCOR) 40 MG tablet TAKE 1  TABLET BY MOUTH AT  BEDTIME OR AS DIRECTED FOR  CHOLESTEROL (Patient taking differently: Take 20 mg by mouth in the evening on Mon/Wed/Fri) 90 tablet 1 07/24/2017 at pm  . vitamin C (ASCORBIC ACID) 500 MG tablet Take 500 mg by mouth daily.     07/24/2017 at am    Inpatient Medications:  . atorvastatin  40 mg Oral q1800  . clopidogrel  75 mg Oral Daily  . enoxaparin (LOVENOX) injection  40 mg Subcutaneous Q24H  . finasteride  5 mg Oral Daily  . gabapentin  300 mg Oral BID  . insulin aspart  0-9 Units Subcutaneous TID WC  . levothyroxine  25 mcg Oral QAC breakfast  . magnesium oxide  200 mg Oral Daily  . meclizine  25 mg Oral TID  . potassium chloride  10 mEq Oral Daily    Allergies:  Allergies  Allergen Reactions  . Penicillins Other (See Comments)    Was told to "no take this" Has patient had a PCN reaction causing immediate rash, facial/tongue/throat swelling, SOB or lightheadedness with hypotension: Unknown Has patient had a PCN reaction causing severe rash involving mucus membranes or skin necrosis: Unknown Has patient had a PCN reaction that required hospitalization: Unknown Has patient had a PCN reaction occurring within the last 10 years: No If all of the above answers are "NO", then may proceed with Cephalosporin use.     Social History   Social History  . Marital status: Married    Spouse name: N/A  . Number of children: 2  . Years of education: N/A   Occupational History  . 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 Smoking status: Never Smoker  . Smokeless tobacco: Never Used  . Alcohol use No  . Drug use: No  . Sexual activity: Not on file   Other Topics Concern  . Not on file   Social History Narrative  . No narrative on file     Family History  Problem Relation Age of Onset  . Sickle cell anemia Mother   . Diabetes Father   . Stomach cancer Father   . Colon cancer Neg Hx   . CAD Neg Hx       Review of  Systems: All other systems reviewed and are otherwise negative except as noted above.  Physical Exam: Vitals:   07/28/17 1228 07/28/17 1238 07/28/17 1248 07/28/17 1348  BP: (!) 142/87 (!) 149/86 (!) 157/80 (!) 161/95  Pulse: 100 95 92 88  Resp: (!) 28 (!) 21 18   Temp: 98.7 F (37.1 C)   99.1 F (37.3 C)  TempSrc: Oral   Oral  SpO2: 97% 100% 100% 97%  Weight:      Height:        GEN- The patient is well appearing, alert and oriented x 3 today.   Head- normocephalic, atraumatic Eyes-  Sclera clear, conjunctiva pink Ears- hearing intact Oropharynx- clear Neck- supple Lungs- Clear to ausculation bilaterally, normal work of breathing Heart- Regular rate and rhythm, no murmurs, rubs or gallops  GI- soft, NT, ND, + BS Extremities- no clubbing, cyanosis, or edema MS- no significant deformity or atrophy Skin- no rash or lesion Psych- euthymic mood, full affect   Labs:   Lab Results  Component Value Date   WBC 7.8 07/28/2017   HGB 14.3 07/28/2017   HCT 41.4 07/28/2017   MCV 83.1 07/28/2017   PLT 191 07/28/2017    Recent Labs Lab 07/25/17 1720  07/28/17 0416  NA 139  < > 137  K 4.2  < > 4.1  CL 106  < > 105  CO2 22  < > 22  BUN 14  < > 11  CREATININE 1.16  < > 1.09  CALCIUM 9.0  < > 8.6*  PROT 6.2*  --   --   BILITOT 0.7  --   --   ALKPHOS 65  --   --   ALT 19  --   --   AST 24  --   --   GLUCOSE 330*  < > 281*  < > = values in this interval not displayed.   Radiology/Studies: Ct Angio Head W Or Wo Contrast  Result Date: 07/26/2017 CLINICAL DATA:  76 y/o M; dizziness and nausea with vomiting. Evaluation for stroke. EXAM: CT ANGIOGRAPHY HEAD AND NECK TECHNIQUE: Multidetector CT imaging of the head and neck was performed using the standard protocol during bolus administration of intravenous contrast. Multiplanar CT image reconstructions and MIPs were obtained to evaluate the vascular anatomy. Carotid stenosis measurements (when applicable) are obtained utilizing  NASCET criteria, using the distal internal carotid diameter as the denominator. CONTRAST:  50 cc Isovue 370 COMPARISON:  07/25/2017 CT head, 04/29/2013 thyroid ultrasound FINDINGS: CTA NECK FINDINGS Aortic arch: Standard branching. Imaged portion shows no evidence of aneurysm or dissection. No significant stenosis of the major arch vessel origins. Mild calcific atherosclerosis. Right carotid system: No evidence of dissection, stenosis (50% or greater) or occlusion. Left carotid system: No evidence of dissection, stenosis (50% or greater) or occlusion. Minimal calcific atherosclerosis of the carotid bifurcation. Vertebral arteries: Codominant. No evidence of dissection, stenosis (50% or greater) or occlusion. Skeleton: Mild cervical spondylosis with disc facet degenerative changes greatest in the upper cervical spine. L3-4 small central disc protrusion. No high-grade bony canal or foraminal stenosis. Other neck: Lipoma measuring 23 x 51 x 53 mm (AP x ML x CC series 6: Image 263, 8:102) within strap muscles anterior to larynx. Left lobe of thyroid nodule measuring 41 x 48 x 53 mm. Upper chest: Negative peer Review of the MIP images confirms the above findings CTA HEAD FINDINGS Anterior circulation: No significant stenosis, proximal occlusion, aneurysm, or vascular malformation. Mild non stenotic calcific atherosclerosis of carotid siphons. Posterior circulation: No significant stenosis, proximal occlusion, aneurysm, or vascular malformation. A diminutive right AICA branches identified. Venous sinuses: As permitted by contrast timing, patent. Anatomic variants: Anterior and bilateral posterior communicating arteries are present. Large left A1, small right A1, and large anterior communicating artery, normal variant. Delayed phase: Right PICA distribution hypoattenuation with loss of gray-white differentiation compatible with late acute to subacute infarction. No abnormal enhancement of the brain. Review of the MIP  images confirms the above findings IMPRESSION: 1. Patent carotid and vertebral arteries. No dissection, aneurysm, or hemodynamically significant stenosis utilizing NASCET criteria. 2. Patent circle of Willis. No large  vessel occlusion, aneurysm, or significant stenosis. 3. Right AICA distribution late acute/early subacute infarction. 4. No hemorrhage or abnormal enhancement in the brain. 5. Lipoma within strap muscles anterior to larynx. 6. Large nodule in left lobe of thyroid, stable from prior thyroid ultrasounds. These results were called by telephone at the time of interpretation on 07/26/2017 at 1:11 am to Dr. Roland Rack , who verbally acknowledged these results. Electronically Signed   By: Kristine Garbe M.D.   On: 07/26/2017 01:14   Ct Head Wo Contrast  Result Date: 07/25/2017 CLINICAL DATA:  Weakness and ataxia. EXAM: CT HEAD WITHOUT CONTRAST TECHNIQUE: Contiguous axial images were obtained from the base of the skull through the vertex without intravenous contrast. COMPARISON:  None. FINDINGS: Brain: There is a moderate-sized, wedge-shaped region of edema in the right cerebellum consistent with acute SCA territory infarct. No intracranial hemorrhage, mass, midline shift, or extra-axial fluid collection is identified. Mild cerebral atrophy is within normal limits for age. Vascular: Carotid siphon atherosclerosis.  No hyperdense vessel. Skull: No fracture focal osseous lesion. Sinuses/Orbits: Visualized paranasal sinuses and mastoid air cells are clear. Bilateral cataract extraction is noted. Other: None. IMPRESSION: 1. Acute right cerebellar SCA territory infarct. 2. No intracranial hemorrhage. Electronically Signed   By: Logan Bores M.D.   On: 07/25/2017 17:53   Ct Angio Neck W Or Wo Contrast  Result Date: 07/26/2017 CLINICAL DATA:  76 y/o M; dizziness and nausea with vomiting. Evaluation for stroke. EXAM: CT ANGIOGRAPHY HEAD AND NECK TECHNIQUE: Multidetector CT imaging of the head  and neck was performed using the standard protocol during bolus administration of intravenous contrast. Multiplanar CT image reconstructions and MIPs were obtained to evaluate the vascular anatomy. Carotid stenosis measurements (when applicable) are obtained utilizing NASCET criteria, using the distal internal carotid diameter as the denominator. CONTRAST:  50 cc Isovue 370 COMPARISON:  07/25/2017 CT head, 04/29/2013 thyroid ultrasound FINDINGS: CTA NECK FINDINGS Aortic arch: Standard branching. Imaged portion shows no evidence of aneurysm or dissection. No significant stenosis of the major arch vessel origins. Mild calcific atherosclerosis. Right carotid system: No evidence of dissection, stenosis (50% or greater) or occlusion. Left carotid system: No evidence of dissection, stenosis (50% or greater) or occlusion. Minimal calcific atherosclerosis of the carotid bifurcation. Vertebral arteries: Codominant. No evidence of dissection, stenosis (50% or greater) or occlusion. Skeleton: Mild cervical spondylosis with disc facet degenerative changes greatest in the upper cervical spine. L3-4 small central disc protrusion. No high-grade bony canal or foraminal stenosis. Other neck: Lipoma measuring 23 x 51 x 53 mm (AP x ML x CC series 6: Image 263, 8:102) within strap muscles anterior to larynx. Left lobe of thyroid nodule measuring 41 x 48 x 53 mm. Upper chest: Negative peer Review of the MIP images confirms the above findings CTA HEAD FINDINGS Anterior circulation: No significant stenosis, proximal occlusion, aneurysm, or vascular malformation. Mild non stenotic calcific atherosclerosis of carotid siphons. Posterior circulation: No significant stenosis, proximal occlusion, aneurysm, or vascular malformation. A diminutive right AICA branches identified. Venous sinuses: As permitted by contrast timing, patent. Anatomic variants: Anterior and bilateral posterior communicating arteries are present. Large left A1, small right  A1, and large anterior communicating artery, normal variant. Delayed phase: Right PICA distribution hypoattenuation with loss of gray-white differentiation compatible with late acute to subacute infarction. No abnormal enhancement of the brain. Review of the MIP images confirms the above findings IMPRESSION: 1. Patent carotid and vertebral arteries. No dissection, aneurysm, or hemodynamically significant stenosis utilizing NASCET criteria. 2.  Patent circle of Willis. No large vessel occlusion, aneurysm, or significant stenosis. 3. Right AICA distribution late acute/early subacute infarction. 4. No hemorrhage or abnormal enhancement in the brain. 5. Lipoma within strap muscles anterior to larynx. 6. Large nodule in left lobe of thyroid, stable from prior thyroid ultrasounds. These results were called by telephone at the time of interpretation on 07/26/2017 at 1:11 am to Dr. Roland Rack , who verbally acknowledged these results. Electronically Signed   By: Kristine Garbe M.D.   On: 07/26/2017 01:14   Mr Brain Wo Contrast  Result Date: 07/26/2017 CLINICAL DATA:  Initial evaluation for acute stroke. EXAM: MRI HEAD WITHOUT CONTRAST TECHNIQUE: Multiplanar, multiecho pulse sequences of the brain and surrounding structures were obtained without intravenous contrast. COMPARISON:  Comparison made with prior CTs from 07/25/2017 as well as 07/26/2017. FINDINGS: Brain: Age-appropriate cerebral atrophy. No significant cerebral white matter for age. Confluent restricted diffusion involving the superior right cerebellar hemisphere, compatible with acute ischemic infarct, right superior cerebral artery territory. No significant mass effect. Scattered petechial hemorrhage without evidence for frank hemorrhagic transformation (series 9, image 24). No other acute infarct. Gray-white matter differentiation otherwise maintained. No other evidence for acute or chronic intracranial hemorrhage. No mass lesion, midline  shift or mass effect. No hydrocephalus. No extra-axial fluid collection. Vascular: Major intracranial vascular flow voids are maintained. Skull and upper cervical spine: Craniocervical junction normal. Visualized upper cervical spine unremarkable. Bone marrow signal intensity within normal limits. No scalp soft tissue abnormality. Sinuses/Orbits: Globes and orbital soft tissues within normal limits. Patient status post lens extraction bilaterally. Paranasal sinuses are clear. Trace right mastoid effusion noted. Inner ear structures normal. Other: None. IMPRESSION: 1. Acute ischemic superior right cerebellar infarct without significant mass effect. Associated mild petechial hemorrhage without frank hemorrhagic transformation. 2. Otherwise normal brain MRI. Electronically Signed   By: Jeannine Boga M.D.   On: 07/26/2017 12:57    12-lead ECG sinus @ 80 with normal intervals (personally reviewed) All prior EKG's in EPIC reviewed with no documented atrial fibrillation  Telemetry some pac and pvc  No afib (personally reviewed)  Assessment and Plan:  1. Cryptogenic stroke The patient presents with cryptogenic stroke.  The patient has a TEE planned for this AM.  I spoke at length with the patient about monitoring for afib with an implantable loop recorder.  Risks, benefits, and alteratives to implantable loop recorder were discussed with the patient today.   At this time, the patient is very clear in their decision to proceed with implantable loop recorder.   Wound care was reviewed with the patient (keep incision clean and dry for 3 days).  Wound check scheduled and entered in AVS.  Please call with questions.   [name], MD 07/28/2017 4:44 PM

## 2017-07-28 NOTE — Care Management Note (Signed)
Case Management Note  Patient Details  Name: Earl [name] MRN: [id number] Date of Birth: [birth year]  Subjective/Objective:    Pt admitted with CVA. He is from home with his spouse.                Action/Plan: Pt discharging to CIR today. No further needs per CM.  Expected Discharge Date:  07/28/17               Expected Discharge Plan:  [place]  In-House Referral:     Discharge planning Services  CM Consult  Post Acute Care Choice:    Choice offered to:     DME Arranged:    DME Agency:     HH Arranged:    HH Agency:     Status of Service:  Completed, signed off  If discussed at [employer], dates discussed:    Additional Comments:  Pollie Friar, RN 07/28/2017, 1:29 PM

## 2017-07-28 NOTE — Progress Notes (Signed)
I have an inpt rehab bed available to admit pt today after stroke workup complete with TEE and Loop today. Pt and family in agreement. RN is aware as well as RN CM and SW. I will make the arrangements to admit after workup today. [phone]

## 2017-07-28 NOTE — Anesthesia Postprocedure Evaluation (Signed)
Anesthesia Post Note  Patient: Earl [name]  Procedure(s) Performed: Procedure(s) (LRB): TRANSESOPHAGEAL ECHOCARDIOGRAM (TEE) (N/A)     Patient location during evaluation: PACU Anesthesia Type: MAC Level of consciousness: awake and alert Pain management: pain level controlled Vital Signs Assessment: post-procedure vital signs reviewed and stable Respiratory status: spontaneous breathing, nonlabored ventilation, respiratory function stable and patient connected to nasal cannula oxygen Cardiovascular status: stable and blood pressure returned to baseline Anesthetic complications: no    Last Vitals:  Vitals:   07/28/17 1248 07/28/17 1348  BP: (!) 157/80 (!) 161/95  Pulse: 92 88  Resp: 18   Temp:  37.3 C  SpO2: 100% 97%    Last Pain:  Vitals:   07/28/17 1348  TempSrc: Oral  PainSc:                  Ryan P Ellender

## 2017-07-28 NOTE — Anesthesia Preprocedure Evaluation (Addendum)
Anesthesia Evaluation  Patient identified by MRN, date of birth, ID band Patient awake    Reviewed: Allergy & Precautions, NPO status , Patient's Chart, lab work & pertinent test results  Airway Mallampati: III  TM Distance: >3 FB Neck ROM: Full    Dental  (+) Partial Upper   Pulmonary neg pulmonary ROS,    Pulmonary exam normal breath sounds clear to auscultation       Cardiovascular hypertension, Pt. on home beta blockers and Pt. on medications Normal cardiovascular exam+ dysrhythmias Atrial Fibrillation  Rhythm:Regular Rate:Normal  ECG: SR, rate 80  ECHO: Normal LV systolic function; moderate diastolic dysfunction; mild LVH; mild AI; mild MR; mild RVE; mild TR with mildly elevated pulmonary pressure.   Neuro/Psych CVA (right sided weakness), Residual Symptoms negative psych ROS   GI/Hepatic negative GI ROS, Neg liver ROS,   Endo/Other  diabetes, Oral Hypoglycemic AgentsHypothyroidism   Renal/GU negative Renal ROS     Musculoskeletal negative musculoskeletal ROS (+)   Abdominal   Peds  Hematology negative hematology ROS (+)   Anesthesia Other Findings HLD  Reproductive/Obstetrics                            Anesthesia Physical Anesthesia Plan  ASA: III  Anesthesia Plan: MAC   Post-op Pain Management:    Induction: Intravenous  PONV Risk Score and Plan: 1 and Propofol infusion and Treatment may vary due to age or medical condition  Airway Management Planned: Natural Airway  Additional Equipment:   Intra-op Plan:   Post-operative Plan:   Informed Consent: I have reviewed the patients History and Physical, chart, labs and discussed the procedure including the risks, benefits and alternatives for the proposed anesthesia with the patient or authorized representative who has indicated his/her understanding and acceptance.   Dental advisory given  Plan Discussed with:  CRNA  Anesthesia Plan Comments:         Anesthesia Quick Evaluation

## 2017-07-28 NOTE — Discharge Summary (Signed)
Physician Discharge Summary  Earl [name] PPJ:[id number] DOB: 04-11-1941 DOA: 07/25/2017  PCP: [name], MD  Admit date: 07/25/2017 Discharge date: 07/28/2017  Time spent: 45 minutes  Recommendations for Outpatient Follow-up:  Patient will be discharged to inpatient rehab. Continue physical, occupational, speech therapy..  Patient will need to follow up with primary care provider within one week of discharge.  Follow-up with neurology in 6 weeks. Patient should continue medications as prescribed.  Patient should follow a heart healthy/carb modified diet.   Discharge Diagnoses:  Acute CVA Dizziness Essential hypertension Diabetes Mellitus, type II History of MGUS Transient episode of atrial fibrillation Hypothyroidism Hyperlipidemia  Discharge Condition: Stable  Diet recommendation: heart healthy/carb modified  Filed Weights   07/26/17 0300  Weight: 82.1 kg (181 lb 1.6 oz)    History of present illness:  on 07/25/2017 by Dr. Matilde Bash Tollisonis a 76 y.o.malewith medical history significant for type II DM, paroxysmal A. Fib, hypothyroidism, MGUS, who presented to the ED today with complaints of ear fullness bilaterally, with drinking and subsequently dizziness that started about 4 AM this morning presents while lying down, severe enough to prevent him from walking or getting up. It is associated vomiting- 5 times today, nonbloody, consistent recently ingested meals. No facial asymmetry, no vision change, slurred speech, no extremity weakness. Patient endorses recent good PO intake, nodiarrhea, no black stools. Patient has been compliant with all his medications- which includes 4 antihypertensives, including aspirin 81mg  daily for prophylaxis. Patient denies tobacco abuse history, alcohol use or illicit drug use.  Hospital Course:  Acute CVA -CT head showed acute right cerebellar SCA territory infarct -CTA head and neck showed patent carotid and  vertebral arteries, no dissection or aneurysm. Patent circle of Willis. Right AICA distribution late acute/early subacute infarction. -MRI brain showed acute ischemic superior right cerebellar infarct without mass effect. Associated mild petechial hemorrhage without frank hemorrhagic transformation. -Neurology consulted and appreciated -Echocardiogram EF 58-09%, grade 2 diastolic dysfunction -Hemoglobin A1c 6.7 -LDL 87 -PT and OT consult, recommended CIR- will be discharged to CIR today -Continue aspirin, statin -Neurology recommended TEE with loop recorder -Cardiology consulted, s/p TEE: no source of embolism, EF 55-60%/loop recorder   Dizziness -Likely secondary to the above -orthostatic vitals unremarkable -Continue meclizine  Essential hypertension -Allow for permissive hypertension given its use CVA -Atenolol, Ziac, quinapril held  Diabetes Mellitus, type II -Hemoglobin A1c 6.7 -Glipizide and metformin held- may restart upon discharge -was placed on nsulin sliding scale CBG monitoring during hospitalization  History of MGUS -Follows with Dr. Alen Blew  Transient episode of atrial fibrillation -Occurred in 2013 after colonoscopy -Patient was finally by Dr. Angelena Form, cardiology. Patient did have an event monitor showed sinus rhythm with frequent PACs, rare PVCs. Continue 325 mg daily -loop recorder placed  Hypothyroidism -Continue Synthroid  Hyperlipidemia -Patient uses simvastatin at home however taking half a tablet 3 times per week -Lipid panel showed total cholesterol 147, HDL 51, LDL 87, triglycerides 45 -Continue statin  Consultants Neurology Inpatient rehabilitation Cardiology/EP  Procedures  Echocardiogram TEE Loop recorder placement  Discharge Exam: Vitals:   07/28/17 1348   BP: (!) 161/95    Pulse: 88    Resp:     Temp: 99.1 F (37.3 C)    SpO2: 97%    Feels headache and dizziness has improved. Denies chest pain, shorntness of breath,  abdominal pain, nausea, vomiting, diarrhea, constipation.    General: Well developed, well nourished, NAD, appears stated age  HEENT: NCAT,  mucous membranes moist.  Cardiovascular: S1 S2 auscultated, RRR, no murmurs  Respiratory: Clear to auscultation bilaterally  Abdomen: Soft, nontender, nondistended, + bowel sounds  Extremities: warm dry without cyanosis clubbing or edema  Neuro: AAOx3, nonfocal   Psych: Normal affect and demeanor, pleasant  Discharge Instructions Discharge Instructions    Care order/instruction    Complete by:  As directed    Remove bulky dressing in Am Steristrips in place until seen  In office Keep wound dry for 48 hrs No driving for 7 days Wound check in office , to be scheduled prior to release   Discharge instructions    Complete by:  As directed    Patient will be discharged to inpatient rehab. Continue physical, occupational, speech therapy..  Patient will need to follow up with primary care provider within one week of discharge.  Follow-up with neurology in 6 weeks. Patient should continue medications as prescribed.  Patient should follow a heart healthy/carb modified diet.     Discharge Medication List as of 07/28/2017  6:26 PM    CONTINUE these medications which have NOT CHANGED   Details  aspirin 81 MG tablet Take 81 mg by mouth daily., Historical Med    atenolol (TENORMIN) 100 MG tablet TAKE 1 TABLET BY MOUTH  DAILY, Normal    bisoprolol-hydrochlorothiazide (ZIAC) 10-6.25 MG tablet Take 1 tablet by mouth daily., Starting Tue 10/18/2016, Normal    Cholecalciferol (VITAMIN D PO) Take 5,000 Units by mouth daily., Historical Med    Cyanocobalamin (B-12) 50 MCG TABS Take 50 mcg by mouth daily., Historical Med    finasteride (PROSCAR) 5 MG tablet TAKE 1 TABLET BY MOUTH  EVERY DAY FOR PROSTATE, Normal    gabapentin (NEURONTIN) 300 MG capsule TAKE 1 TO 2 CAPSULES BY  MOUTH AT NIGHT FOR PAIN, Normal    glipiZIDE (GLUCOTROL) 5 MG tablet TAKE 1  TABLET BY MOUTH 3  TIMES DAILY BEFORE MEALS  FOR DIABETES, Normal    levothyroxine (SYNTHROID, LEVOTHROID) 50 MCG tablet TAKE 1 TABLET BY MOUTH  DAILY BEFORE BREAKFAST, Normal    Magnesium 250 MG TABS Take 250 mg by mouth daily., Historical Med    metFORMIN (GLUCOPHAGE-XR) 500 MG 24 hr tablet TAKE 1 TABLET BY MOUTH 4  TIMES DAILY FOR DIABETES, Normal    Multiple Vitamin (MULTIVITAMIN) capsule Take 1 capsule by mouth daily.  , Historical Med    potassium chloride (KLOR-CON) 10 MEQ CR tablet Take 10 mEq by mouth daily.  , Historical Med    quinapril (ACCUPRIL) 40 MG tablet TAKE 1 TABLET BY MOUTH  EVERY DAY FOR BLOOD  PRESSURE, Normal    simvastatin (ZOCOR) 40 MG tablet TAKE 1 TABLET BY MOUTH AT  BEDTIME OR AS DIRECTED FOR  CHOLESTEROL, Normal    vitamin C (ASCORBIC ACID) 500 MG tablet Take 500 mg by mouth daily.  , Historical Med       Allergies  Allergen Reactions  . Penicillins Other (See Comments)    Was told to "no take this" Has patient had a PCN reaction causing immediate rash, facial/tongue/throat swelling, SOB or lightheadedness with hypotension: Unknown Has patient had a PCN reaction causing severe rash involving mucus membranes or skin necrosis: Unknown Has patient had a PCN reaction that required hospitalization: Unknown Has patient had a PCN reaction occurring within the last 10 years: No If all of the above answers are "NO", then may proceed with Cephalosporin use.    Follow-up Information    [name], MD. Schedule an appointment as soon as  possible for a visit in 1 week(s).   Specialty:  Internal Medicine Why:  Hospital follow up Contact information: [address] [place] [address] [phone]        Garvin Fila, MD. Schedule an appointment as soon as possible for a visit in 6 week(s).   Specialties:  Neurology, Radiology Why:  Hospital follow up, stroke clinic Contact information: [address] [place]  [address] [phone]            The results of significant diagnostics from this hospitalization (including imaging, microbiology, ancillary and laboratory) are listed below for reference.    Significant Diagnostic Studies: Ct Angio Head W Or Wo Contrast  Result Date: 07/26/2017 CLINICAL DATA:  76 y/o M; dizziness and nausea with vomiting. Evaluation for stroke. EXAM: CT ANGIOGRAPHY HEAD AND NECK TECHNIQUE: Multidetector CT imaging of the head and neck was performed using the standard protocol during bolus administration of intravenous contrast. Multiplanar CT image reconstructions and MIPs were obtained to evaluate the vascular anatomy. Carotid stenosis measurements (when applicable) are obtained utilizing NASCET criteria, using the distal internal carotid diameter as the denominator. CONTRAST:  50 cc Isovue 370 COMPARISON:  07/25/2017 CT head, 04/29/2013 thyroid ultrasound FINDINGS: CTA NECK FINDINGS Aortic arch: Standard branching. Imaged portion shows no evidence of aneurysm or dissection. No significant stenosis of the major arch vessel origins. Mild calcific atherosclerosis. Right carotid system: No evidence of dissection, stenosis (50% or greater) or occlusion. Left carotid system: No evidence of dissection, stenosis (50% or greater) or occlusion. Minimal calcific atherosclerosis of the carotid bifurcation. Vertebral arteries: Codominant. No evidence of dissection, stenosis (50% or greater) or occlusion. Skeleton: Mild cervical spondylosis with disc facet degenerative changes greatest in the upper cervical spine. L3-4 small central disc protrusion. No high-grade bony canal or foraminal stenosis. Other neck: Lipoma measuring 23 x 51 x 53 mm (AP x ML x CC series 6: Image 263, 8:102) within strap muscles anterior to larynx. Left lobe of thyroid nodule measuring 41 x 48 x 53 mm. Upper chest: Negative peer Review of the MIP images confirms the above findings CTA HEAD FINDINGS Anterior circulation: No  significant stenosis, proximal occlusion, aneurysm, or vascular malformation. Mild non stenotic calcific atherosclerosis of carotid siphons. Posterior circulation: No significant stenosis, proximal occlusion, aneurysm, or vascular malformation. A diminutive right AICA branches identified. Venous sinuses: As permitted by contrast timing, patent. Anatomic variants: Anterior and bilateral posterior communicating arteries are present. Large left A1, small right A1, and large anterior communicating artery, normal variant. Delayed phase: Right PICA distribution hypoattenuation with loss of gray-white differentiation compatible with late acute to subacute infarction. No abnormal enhancement of the brain. Review of the MIP images confirms the above findings IMPRESSION: 1. Patent carotid and vertebral arteries. No dissection, aneurysm, or hemodynamically significant stenosis utilizing NASCET criteria. 2. Patent circle of Willis. No large vessel occlusion, aneurysm, or significant stenosis. 3. Right AICA distribution late acute/early subacute infarction. 4. No hemorrhage or abnormal enhancement in the brain. 5. Lipoma within strap muscles anterior to larynx. 6. Large nodule in left lobe of thyroid, stable from prior thyroid ultrasounds. These results were called by telephone at the time of interpretation on 07/26/2017 at 1:11 am to Dr. Roland Rack , who verbally acknowledged these results. Electronically Signed   By: Kristine Garbe M.D.   On: 07/26/2017 01:14   Ct Head Wo Contrast  Result Date: 07/25/2017 CLINICAL DATA:  Weakness and ataxia. EXAM: CT HEAD WITHOUT CONTRAST TECHNIQUE: Contiguous  axial images were obtained from the base of the skull through the vertex without intravenous contrast. COMPARISON:  None. FINDINGS: Brain: There is a moderate-sized, wedge-shaped region of edema in the right cerebellum consistent with acute SCA territory infarct. No intracranial hemorrhage, mass, midline shift, or  extra-axial fluid collection is identified. Mild cerebral atrophy is within normal limits for age. Vascular: Carotid siphon atherosclerosis.  No hyperdense vessel. Skull: No fracture focal osseous lesion. Sinuses/Orbits: Visualized paranasal sinuses and mastoid air cells are clear. Bilateral cataract extraction is noted. Other: None. IMPRESSION: 1. Acute right cerebellar SCA territory infarct. 2. No intracranial hemorrhage. Electronically Signed   By: Logan Bores M.D.   On: 07/25/2017 17:53   Ct Angio Neck W Or Wo Contrast  Result Date: 07/26/2017 CLINICAL DATA:  76 y/o M; dizziness and nausea with vomiting. Evaluation for stroke. EXAM: CT ANGIOGRAPHY HEAD AND NECK TECHNIQUE: Multidetector CT imaging of the head and neck was performed using the standard protocol during bolus administration of intravenous contrast. Multiplanar CT image reconstructions and MIPs were obtained to evaluate the vascular anatomy. Carotid stenosis measurements (when applicable) are obtained utilizing NASCET criteria, using the distal internal carotid diameter as the denominator. CONTRAST:  50 cc Isovue 370 COMPARISON:  07/25/2017 CT head, 04/29/2013 thyroid ultrasound FINDINGS: CTA NECK FINDINGS Aortic arch: Standard branching. Imaged portion shows no evidence of aneurysm or dissection. No significant stenosis of the major arch vessel origins. Mild calcific atherosclerosis. Right carotid system: No evidence of dissection, stenosis (50% or greater) or occlusion. Left carotid system: No evidence of dissection, stenosis (50% or greater) or occlusion. Minimal calcific atherosclerosis of the carotid bifurcation. Vertebral arteries: Codominant. No evidence of dissection, stenosis (50% or greater) or occlusion. Skeleton: Mild cervical spondylosis with disc facet degenerative changes greatest in the upper cervical spine. L3-4 small central disc protrusion. No high-grade bony canal or foraminal stenosis. Other neck: Lipoma measuring 23 x 51 x 53  mm (AP x ML x CC series 6: Image 263, 8:102) within strap muscles anterior to larynx. Left lobe of thyroid nodule measuring 41 x 48 x 53 mm. Upper chest: Negative peer Review of the MIP images confirms the above findings CTA HEAD FINDINGS Anterior circulation: No significant stenosis, proximal occlusion, aneurysm, or vascular malformation. Mild non stenotic calcific atherosclerosis of carotid siphons. Posterior circulation: No significant stenosis, proximal occlusion, aneurysm, or vascular malformation. A diminutive right AICA branches identified. Venous sinuses: As permitted by contrast timing, patent. Anatomic variants: Anterior and bilateral posterior communicating arteries are present. Large left A1, small right A1, and large anterior communicating artery, normal variant. Delayed phase: Right PICA distribution hypoattenuation with loss of gray-white differentiation compatible with late acute to subacute infarction. No abnormal enhancement of the brain. Review of the MIP images confirms the above findings IMPRESSION: 1. Patent carotid and vertebral arteries. No dissection, aneurysm, or hemodynamically significant stenosis utilizing NASCET criteria. 2. Patent circle of Willis. No large vessel occlusion, aneurysm, or significant stenosis. 3. Right AICA distribution late acute/early subacute infarction. 4. No hemorrhage or abnormal enhancement in the brain. 5. Lipoma within strap muscles anterior to larynx. 6. Large nodule in left lobe of thyroid, stable from prior thyroid ultrasounds. These results were called by telephone at the time of interpretation on 07/26/2017 at 1:11 am to Dr. Roland Rack , who verbally acknowledged these results. Electronically Signed   By: Kristine Garbe M.D.   On: 07/26/2017 01:14   Mr Brain Wo Contrast  Result Date: 07/26/2017 CLINICAL DATA:  Initial evaluation for acute  stroke. EXAM: MRI HEAD WITHOUT CONTRAST TECHNIQUE: Multiplanar, multiecho pulse sequences of the  brain and surrounding structures were obtained without intravenous contrast. COMPARISON:  Comparison made with prior CTs from 07/25/2017 as well as 07/26/2017. FINDINGS: Brain: Age-appropriate cerebral atrophy. No significant cerebral white matter for age. Confluent restricted diffusion involving the superior right cerebellar hemisphere, compatible with acute ischemic infarct, right superior cerebral artery territory. No significant mass effect. Scattered petechial hemorrhage without evidence for frank hemorrhagic transformation (series 9, image 24). No other acute infarct. Gray-white matter differentiation otherwise maintained. No other evidence for acute or chronic intracranial hemorrhage. No mass lesion, midline shift or mass effect. No hydrocephalus. No extra-axial fluid collection. Vascular: Major intracranial vascular flow voids are maintained. Skull and upper cervical spine: Craniocervical junction normal. Visualized upper cervical spine unremarkable. Bone marrow signal intensity within normal limits. No scalp soft tissue abnormality. Sinuses/Orbits: Globes and orbital soft tissues within normal limits. Patient status post lens extraction bilaterally. Paranasal sinuses are clear. Trace right mastoid effusion noted. Inner ear structures normal. Other: None. IMPRESSION: 1. Acute ischemic superior right cerebellar infarct without significant mass effect. Associated mild petechial hemorrhage without frank hemorrhagic transformation. 2. Otherwise normal brain MRI. Electronically Signed   By: Jeannine Boga M.D.   On: 07/26/2017 12:57    Microbiology: No results found for this or any previous visit (from the past 240 hour(s)).   Labs: Basic Metabolic Panel:  Recent Labs Lab 07/25/17 1720 07/25/17 1732 07/27/17 0253 07/28/17 0416  NA 139 139 139 137  K 4.2 4.2 4.0 4.1  CL 106 105 106 105  CO2 22  --  26 22  GLUCOSE 330* 331* 221* 281*  BUN 14 16 10 11   CREATININE 1.16 1.00 1.13 1.09    CALCIUM 9.0  --  8.7* 8.6*   Liver Function Tests:  Recent Labs Lab 07/25/17 1720  AST 24  ALT 19  ALKPHOS 65  BILITOT 0.7  PROT 6.2*  ALBUMIN 3.7   No results for input(s): LIPASE, AMYLASE in the last 168 hours. No results for input(s): AMMONIA in the last 168 hours. CBC:  Recent Labs Lab 07/25/17 1720 07/25/17 1732 07/27/17 0253 07/28/17 0416  WBC 8.5  --  6.5 7.8  NEUTROABS 6.9  --   --   --   HGB 13.2 13.6 13.0 14.3  HCT 38.2* 40.0 38.8* 41.4  MCV 83.2  --  84.2 83.1  PLT 198  --  171 191   Cardiac Enzymes: No results for input(s): CKTOTAL, CKMB, CKMBINDEX, TROPONINI in the last 168 hours. BNP: BNP (last 3 results) No results for input(s): BNP in the last 8760 hours.  ProBNP (last 3 results) No results for input(s): PROBNP in the last 8760 hours.  CBG:  Recent Labs Lab 07/27/17 2140 07/28/17 0650 07/28/17 1124 07/28/17 1313 07/28/17 1617  GLUCAP 271* 261* 201* 200* 349*       Signed:  Marvell Tamer  [facility] 07/28/2017, 6:40 PM

## 2017-07-28 NOTE — CV Procedure (Signed)
    PROCEDURE NOTE:  Procedure:  Transesophageal echocardiogram Operator:  Fransico Him, MD Indications:  CVA Complications: None  During this procedure the patient is administered a total of mg of Propofol to achieve and maintain moderate conscious sedation by anesthesai.  The patient's heart rate, blood pressure, and oxygen saturation are monitored continuously during the procedure.   Results: Normal LV size and function with moderate LVH.  EF 55-60% Normal RV size and function Mildly dilated RA Normal LA and LA appendage with normal emptying velocity Normal TV with mild TR Normal PV with mild PR Normal MV with mild MR Normal trileaflet AV with mild AR Normal interatrial septum with no evidence of shunt by colorflow dopper and agitated saline contrast injection Normal thoracic and ascending aorta.  No source of embolism The patient tolerated the procedure well and was transferred back to their room in stable condition.  Signed: Fransico Him, MD [facility]

## 2017-07-28 NOTE — Interval H&P Note (Signed)
History and Physical Interval Note:  07/28/2017 12:05 PM  Earl [name]  has presented today for surgery, with the diagnosis of STROKE  The various methods of treatment have been discussed with the patient and family. After consideration of risks, benefits and other options for treatment, the patient has consented to  Procedure(s): TRANSESOPHAGEAL ECHOCARDIOGRAM (TEE) (N/A) as a surgical intervention .  The patient's history has been reviewed, patient examined, no change in status, stable for surgery.  I have reviewed the patient's chart and labs.  Questions were answered to the patient's satisfaction.     Fransico Him

## 2017-07-28 NOTE — H&P (Signed)
Physical Medicine and Rehabilitation Admission H&P       Chief Complaint  Patient presents with  . Stroke with dizziness, Nausea and vomiting with limitations in mobility and self care tasks    HPI:  Earl [name] a 76 y.o.malewith history of HTN, questionable history PAF, T2DM; who was admitted on 07/25/17 with dizziness, nausea and right sided weakness. History taken from chart review and patient. CT head reviewed, showing acute superior  right cerebellar infarct. CTA head/neck done revealing right AICA late acute/early subacute infarct, no vascular abnormality. Patient started on ASA for stroke prophylaxis and TEE ordered for work up of embolic stroke  Therapy ongoing with limitations in mobility and self care tasks. CIR recommended for follow up therapy.   Review of Systems  HENT: Negative for hearing loss and tinnitus.   Eyes: Negative for blurred vision and double vision.  Respiratory: Negative for cough and shortness of breath.   Cardiovascular: Negative for chest pain and palpitations.  Gastrointestinal: Positive for constipation (needed enema yesterday). Negative for heartburn and nausea.  Genitourinary: Negative for dysuria and urgency.  Musculoskeletal: Negative for back pain, myalgias and neck pain.  Skin: Negative for itching and rash.  Neurological: Positive for dizziness (with quick movements). Negative for weakness.  Psychiatric/Behavioral: The patient is not nervous/anxious and does not have insomnia.          Past Medical History:  Diagnosis Date  . Atrial fibrillation ([place])   . BPH (benign prostatic hyperplasia)   . Diabetes mellitus ([place])   . Hyperlipidemia   . Hypertension   . Other testicular hypofunction   . Type II or unspecified type diabetes mellitus without mention of complication, not stated as uncontrolled          Past Surgical History:  Procedure Laterality Date  . INGUINAL HERNIA REPAIR     1968 and 2004           Family History  Problem Relation Age of Onset  . Sickle cell anemia Mother   . Diabetes Father   . Stomach cancer Father   . Colon cancer Neg Hx   . CAD Neg Hx     Social History:  Married. Retired--management in retail. Wife completed chemo a month ago--can provide supervision after discharge. Independent and active PTA. He reports that he has never smoked. He has never used smokeless tobacco. He reports that he does not drink alcohol or use drugs.         Allergies  Allergen Reactions  . Penicillins Other (See Comments)    Was told to "no take this" Has patient had a PCN reaction causing immediate rash, facial/tongue/throat swelling, SOB or lightheadedness with hypotension: Unknown Has patient had a PCN reaction causing severe rash involving mucus membranes or skin necrosis: Unknown Has patient had a PCN reaction that required hospitalization: Unknown Has patient had a PCN reaction occurring within the last 10 years: No If all of the above answers are "NO", then may proceed with Cephalosporin use.           Medications Prior to Admission  Medication Sig Dispense Refill  . aspirin 81 MG tablet Take 81 mg by mouth daily.    [name] atenolol (TENORMIN) 100 MG tablet TAKE 1 TABLET BY MOUTH  DAILY (Patient taking differently: Take 100 mg by mouth once a day) 90 tablet 1  . bisoprolol-hydrochlorothiazide (ZIAC) 10-6.25 MG tablet Take 1 tablet by mouth daily. 90 tablet 1  . Cholecalciferol (VITAMIN D PO) Take  5,000 Units by mouth daily.    . Cyanocobalamin (B-12) 50 MCG TABS Take 50 mcg by mouth daily.    . finasteride (PROSCAR) 5 MG tablet TAKE 1 TABLET BY MOUTH  EVERY DAY FOR PROSTATE (Patient taking differently: Take 2.5 mg by mouth at bedtime) 90 tablet 1  . gabapentin (NEURONTIN) 300 MG capsule TAKE 1 TO 2 CAPSULES BY  MOUTH AT NIGHT FOR PAIN (Patient taking differently: Take 300-600 mg by mouth at bedtime for pain) 180 capsule 1  . glipiZIDE (GLUCOTROL)  5 MG tablet TAKE 1 TABLET BY MOUTH 3  TIMES DAILY BEFORE MEALS  FOR DIABETES (Patient taking differently: Take 5 mg by mouth three times a day before meals for diabetes) 270 tablet 1  . levothyroxine (SYNTHROID, LEVOTHROID) 50 MCG tablet TAKE 1 TABLET BY MOUTH  DAILY BEFORE BREAKFAST (Patient taking differently: Take 25 mcg by mouth in the morning before breakfast) 90 tablet 1  . Magnesium 250 MG TABS Take 250 mg by mouth daily.    . metFORMIN (GLUCOPHAGE-XR) 500 MG 24 hr tablet TAKE 1 TABLET BY MOUTH 4  TIMES DAILY FOR DIABETES (Patient taking differently: Take 500 mg by mouth four times a day for diabetes) 360 tablet 1  . Multiple Vitamin (MULTIVITAMIN) capsule Take 1 capsule by mouth daily.      . potassium chloride (KLOR-CON) 10 MEQ CR tablet Take 10 mEq by mouth daily.      . quinapril (ACCUPRIL) 40 MG tablet TAKE 1 TABLET BY MOUTH  EVERY DAY FOR BLOOD  PRESSURE (Patient taking differently: Take 40 mg by mouth once a day for blood pressure) 90 tablet 1  . simvastatin (ZOCOR) 40 MG tablet TAKE 1 TABLET BY MOUTH AT  BEDTIME OR AS DIRECTED FOR  CHOLESTEROL (Patient taking differently: Take 20 mg by mouth in the evening on Mon/Wed/Fri) 90 tablet 1  . vitamin C (ASCORBIC ACID) 500 MG tablet Take 500 mg by mouth daily.        Drug Regimen Review Drug regimen was reviewed and the following issues were identified and addressed : changes/holds made as noted in the medical problem list  Home: Home Living Family/patient expects to be discharged to:: Private residence Living Arrangements: Spouse/significant other (daughter in [place], son in [place]) Available Help at Discharge: Family, Available 24 hours/day Type of Home: House Home Access: Stairs to enter [employer] of Steps:  (front; 2 steps through garage) Entrance Stairs-Rails: None Home Layout: One level, Multi-level Alternate Level Stairs-Number of Steps:  (through garage; son to have rails installed before pt  discha) Alternate Level Stairs-Rails: None Bathroom Shower/Tub: Tub/shower unit, Door [employer] Toilet: Standard Bathroom Accessibility: Yes Home Equipment: None  Lives With: Spouse   Functional History: Prior Function Level of Independence: Independent Comments: drives; retired; 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; 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  Functional Status:  Mobility: Bed Mobility Overal bed mobility: Needs Assistance Bed Mobility: Supine to Sit Supine to sit: Min guard Sit to supine: Min assist General bed mobility comments: assist for balance Transfers Overall transfer level: Needs assistance Equipment used: Rolling walker (2 wheeled) Transfers: Sit to/from Stand Sit to Stand: Min assist Stand pivot transfers: Mod assist, +2 safety/equipment General transfer comment: cues for hand placement, steadying assist  Ambulation/Gait Ambulation/Gait assistance: Min assist, Mod assist Ambulation Distance (Feet): 125 Feet Assistive device: Rolling walker (2 wheeled) Gait Pattern/deviations: Step-to pattern, Step-through pattern, Decreased stride length, Narrow base of support, Ataxic General Gait Details: cues to right head due to leaning L, cues  throughout for visual orientation to upright, assist for balance, step width  ADL: ADL Overall ADL's : Needs assistance/impaired Eating/Feeding: Set up Grooming: Sitting, Minimal assistance Grooming Details (indicate cue type and reason): Difficulty completing ADL unsupported - falls L Upper Body Bathing: Minimal assistance, Sitting Lower Body Bathing: Moderate assistance, Sit to/from stand Upper Body Dressing : Moderate assistance, Sitting Lower Body Dressing: Moderate assistance, Sit to/from stand Toilet Transfer: Moderate assistance, RW, Stand-pivot (+2 would be helpful) Toilet Transfer Details (indicate cue type and reason): simulated Toileting- Clothing Manipulation and Hygiene: Moderate assistance Toileting - Clothing  Manipulation Details (indicate cue type and reason): unable to release RW and manipulate clothing Functional mobility during ADLs: Moderate assistance, +2 for safety/equipment, Rolling walker, Cueing for safety, Cueing for sequencing General ADL Comments: Pt falls toward L when unsupported sitting EOB. motivated to complete ADL independently.  Cognition: Cognition Overall Cognitive Status: Within Functional Limits for tasks assessed Orientation Level: Oriented X4 Cognition Arousal/Alertness: Awake/alert Behavior During Therapy: WFL for tasks assessed/performed Overall Cognitive Status: Within Functional Limits for tasks assessed General Comments: internally distracted by symptoms   Blood pressure (!) 178/91, pulse 87, temperature 99 F (37.2 C), temperature source Oral, resp. rate 18, height 5' 11.5" (1.816 m), weight 82.1 kg (181 lb 1.6 oz), SpO2 98 %. Physical Exam  Nursing note and vitals reviewed. Constitutional: He is oriented to person, place, and time. He appears well-developed and well-nourished. No distress.  HENT:  Head: Normocephalic and atraumatic.  Mouth/Throat: Oropharynx is clear and moist.  Eyes: Pupils are equal, round, and reactive to light. Conjunctivae and EOM are normal.  Neck: Normal range of motion. Neck supple.  Cardiovascular: Normal rate and regular rhythm.  Exam reveals no friction rub.   No murmur heard. Respiratory: Effort normal and breath sounds normal. No stridor. No respiratory distress. He has no wheezes.  GI: Soft. Bowel sounds are normal. He exhibits no distension. There is no tenderness.  Musculoskeletal: He exhibits no edema or tenderness.  Neurological: He is alert and oriented to person, place, and time.  Speech clear. Follow commands without difficulty. Dysmetria RUE with finger to nose and with right heel to shin. RUE/RLE grossly 4/5 prox to distal. LUE and LLE 4+ to 5/5. No sensory deficits.   Skin: Skin is warm and dry. He is not  diaphoretic.  Psychiatric: He has a normal mood and affect. His behavior is normal. Judgment and thought content normal.    Lab Results Last 48 Hours        Results for orders placed or performed during the hospital encounter of 07/25/17 (from the past 48 hour(s))  Glucose, capillary     Status: Abnormal   Collection Time: 07/26/17 12:54 PM  Result Value Ref Range   Glucose-Capillary 268 (H) 65 - 99 mg/dL  Glucose, capillary     Status: Abnormal   Collection Time: 07/26/17  4:21 PM  Result Value Ref Range   Glucose-Capillary 186 (H) 65 - 99 mg/dL  Glucose, capillary     Status: Abnormal   Collection Time: 07/27/17  1:19 AM  Result Value Ref Range   Glucose-Capillary 217 (H) 65 - 99 mg/dL   Comment 1 Notify RN    Comment 2 Document in Chart   CBC     Status: Abnormal   Collection Time: 07/27/17  2:53 AM  Result Value Ref Range   WBC 6.5 4.0 - 10.5 K/uL   RBC 4.61 4.22 - 5.81 MIL/uL   Hemoglobin 13.0 13.0 - 17.0 g/dL  HCT 38.8 (L) 39.0 - 52.0 %   MCV 84.2 78.0 - 100.0 fL   MCH 28.2 26.0 - 34.0 pg   MCHC 33.5 30.0 - 36.0 g/dL   RDW 13.6 11.5 - 15.5 %   Platelets 171 150 - 400 K/uL  Basic metabolic panel     Status: Abnormal   Collection Time: 07/27/17  2:53 AM  Result Value Ref Range   Sodium 139 135 - 145 mmol/L   Potassium 4.0 3.5 - 5.1 mmol/L   Chloride 106 101 - 111 mmol/L   CO2 26 22 - 32 mmol/L   Glucose, Bld 221 (H) 65 - 99 mg/dL   BUN 10 6 - 20 mg/dL   Creatinine, Ser 1.13 0.61 - 1.24 mg/dL   Calcium 8.7 (L) 8.9 - 10.3 mg/dL   GFR calc non Af Amer >60 >60 mL/min   GFR calc Af Amer >60 >60 mL/min    Comment: (NOTE) The eGFR has been calculated using the CKD EPI equation. This calculation has not been validated in all clinical situations. eGFR's persistently <60 mL/min signify possible Chronic Kidney Disease.    Anion gap 7 5 - 15  Glucose, capillary     Status: Abnormal   Collection Time: 07/27/17  6:14 AM  Result Value  Ref Range   Glucose-Capillary 215 (H) 65 - 99 mg/dL   Comment 1 Notify RN    Comment 2 Document in Chart   Glucose, capillary     Status: Abnormal   Collection Time: 07/27/17 11:14 AM  Result Value Ref Range   Glucose-Capillary 303 (H) 65 - 99 mg/dL  Glucose, capillary     Status: Abnormal   Collection Time: 07/27/17  4:16 PM  Result Value Ref Range   Glucose-Capillary 251 (H) 65 - 99 mg/dL  Glucose, capillary     Status: Abnormal   Collection Time: 07/27/17  9:40 PM  Result Value Ref Range   Glucose-Capillary 271 (H) 65 - 99 mg/dL   Comment 1 Notify RN    Comment 2 Document in Chart   CBC     Status: None   Collection Time: 07/28/17  4:16 AM  Result Value Ref Range   WBC 7.8 4.0 - 10.5 K/uL   RBC 4.98 4.22 - 5.81 MIL/uL   Hemoglobin 14.3 13.0 - 17.0 g/dL   HCT 41.4 39.0 - 52.0 %   MCV 83.1 78.0 - 100.0 fL   MCH 28.7 26.0 - 34.0 pg   MCHC 34.5 30.0 - 36.0 g/dL   RDW 13.4 11.5 - 15.5 %   Platelets 191 150 - 400 K/uL  Basic metabolic panel     Status: Abnormal   Collection Time: 07/28/17  4:16 AM  Result Value Ref Range   Sodium 137 135 - 145 mmol/L   Potassium 4.1 3.5 - 5.1 mmol/L   Chloride 105 101 - 111 mmol/L   CO2 22 22 - 32 mmol/L   Glucose, Bld 281 (H) 65 - 99 mg/dL   BUN 11 6 - 20 mg/dL   Creatinine, Ser 1.09 0.61 - 1.24 mg/dL   Calcium 8.6 (L) 8.9 - 10.3 mg/dL   GFR calc non Af Amer >60 >60 mL/min   GFR calc Af Amer >60 >60 mL/min    Comment: (NOTE) The eGFR has been calculated using the CKD EPI equation. This calculation has not been validated in all clinical situations. eGFR's persistently <60 mL/min signify possible Chronic Kidney Disease.    Anion gap 10 5 - 15  Glucose, capillary     Status: Abnormal   Collection Time: 07/28/17  6:50 AM  Result Value Ref Range   Glucose-Capillary 261 (H) 65 - 99 mg/dL   Comment 1 Notify RN    Comment 2 Document in Chart       Imaging Results (Last 48 hours)  Mr  Brain Wo Contrast  Result Date: 07/26/2017 CLINICAL DATA:  Initial evaluation for acute stroke. EXAM: MRI HEAD WITHOUT CONTRAST TECHNIQUE: Multiplanar, multiecho pulse sequences of the brain and surrounding structures were obtained without intravenous contrast. COMPARISON:  Comparison made with prior CTs from 07/25/2017 as well as 07/26/2017. FINDINGS: Brain: Age-appropriate cerebral atrophy. No significant cerebral white matter for age. Confluent restricted diffusion involving the superior right cerebellar hemisphere, compatible with acute ischemic infarct, right superior cerebral artery territory. No significant mass effect. Scattered petechial hemorrhage without evidence for frank hemorrhagic transformation (series 9, image 24). No other acute infarct. Gray-white matter differentiation otherwise maintained. No other evidence for acute or chronic intracranial hemorrhage. No mass lesion, midline shift or mass effect. No hydrocephalus. No extra-axial fluid collection. Vascular: Major intracranial vascular flow voids are maintained. Skull and upper cervical spine: Craniocervical junction normal. Visualized upper cervical spine unremarkable. Bone marrow signal intensity within normal limits. No scalp soft tissue abnormality. Sinuses/Orbits: Globes and orbital soft tissues within normal limits. Patient status post lens extraction bilaterally. Paranasal sinuses are clear. Trace right mastoid effusion noted. Inner ear structures normal. Other: None. IMPRESSION: 1. Acute ischemic superior right cerebellar infarct without significant mass effect. Associated mild petechial hemorrhage without frank hemorrhagic transformation. 2. Otherwise normal brain MRI. Electronically Signed   By: Jeannine Boga M.D.   On: 07/26/2017 12:57        Medical Problem List and Plan: 1.  Functional and mobility deficits secondary to right cerebellar infarct             -admit to inpatient rehab 2.  DVT  Prophylaxis/Anticoagulation: Pharmaceutical: Lovenox 3. Neuropaty/Pain Management: Continue gabapentin.  4. Mood: patient very motivated. LCSW to follow for evaluation and support.  5. Neuropsych: This patient is capable of making decisions on his own behalf. 6. Skin/Wound Care: routine pressure relief measures 7. Fluids/Electrolytes/Nutrition: Monitor I/O. Check lytes in am. 8. HTN: Monitor BP bid--permissive HTN for a few days. Was on Ziac, Accupril  and Metoprolol PTA. Continue Proscar.  9. T2DM: Hgb A1c- 6.7. Monitor BS ac/hs. Po intake good--resume glucotrol. Hold metformin due to recent procedure--resume next week. Continue to use SSI for elevated BS.  10. Hypothyroid: On supplement 11. BPH: On proscar.    Post Admission Physician Evaluation: 1. Functional deficits secondary  to right cerebellar infarct. 2. Patient is admitted to receive collaborative, interdisciplinary care between the physiatrist, rehab nursing staff, and therapy team. 3. Patient's level of medical complexity and substantial therapy needs in context of that medical necessity cannot be provided at a lesser intensity of care such as a SNF. 4. Patient has experienced substantial functional loss from his/her baseline which was documented above under the "Functional History" and "Functional Status" headings.  Judging by the patient's diagnosis, physical exam, and functional history, the patient has potential for functional progress which will result in measurable gains while on inpatient rehab.  These gains will be of substantial and practical use upon discharge  in facilitating mobility and self-care at the household level. 5. Physiatrist will provide 24 hour management of medical needs as well as oversight of the therapy plan/treatment and provide guidance as appropriate regarding the interaction  of the two. 6. The Preadmission Screening has been reviewed and patient status is unchanged unless otherwise stated above. 7. 24  hour rehab nursing will assist with bladder management, bowel management, safety, skin/wound care, disease management, medication administration, pain management and patient education  and help integrate therapy concepts, techniques,education, etc. 8. PT will assess and treat for/with: Lower extremity strength, range of motion, stamina, balance, functional mobility, safety, adaptive techniques and equipment, NMR, vestibular assessment, community reentry.   Goals are: mod I/supervision. 9. OT will assess and treat for/with: ADL's, functional mobility, safety, upper extremity strength, adaptive techniques and equipment, NMR, vestibular mgt, ego support.   Goals are: mod I to supervision. Therapy may proceed with showering this patient. 10. SLP will assess and treat for/with: n/a.  Goals are: n/a. 11. Case Management and Social Worker will assess and treat for psychological issues and discharge planning. 12. Team conference will be held weekly to assess progress toward goals and to determine barriers to discharge. 13. Patient will receive at least 3 hours of therapy per day at least 5 days per week. 14. ELOS: 8-13 days       15. Prognosis:  excellent     Meredith Staggers, MD, [facility] 07/28/2017  Bary Leriche, Hershal Coria 07/28/2017

## 2017-07-28 NOTE — H&P (Signed)
Physical Medicine and Rehabilitation Admission H&P    Chief Complaint  Patient presents with  . Stroke with dizziness, Nausea and vomiting with limitations in mobility and self care tasks    HPI:  Earl [name] is a 76 y.o. male with history of  HTN, questionable history PAF, T2DM; who was admitted on 07/25/17 with dizziness, nausea and right sided weakness. History taken from chart review and patient.  CT head reviewed, showing acute superior  right cerebellar infarct. CTA head/neck done revealing right AICA late acute/early subacute infarct, no vascular abnormality. Patient started on ASA for stroke prophylaxis and TEE ordered for work up of embolic stroke  Therapy ongoing with limitations in mobility and self care tasks. CIR recommended for follow up therapy.   Review of Systems  HENT: Negative for hearing loss and tinnitus.   Eyes: Negative for blurred vision and double vision.  Respiratory: Negative for cough and shortness of breath.   Cardiovascular: Negative for chest pain and palpitations.  Gastrointestinal: Positive for constipation (needed enema yesterday). Negative for heartburn and nausea.  Genitourinary: Negative for dysuria and urgency.  Musculoskeletal: Negative for back pain, myalgias and neck pain.  Skin: Negative for itching and rash.  Neurological: Positive for dizziness (with quick movements). Negative for weakness.  Psychiatric/Behavioral: The patient is not nervous/anxious and does not have insomnia.      Past Medical History:  Diagnosis Date  . Atrial fibrillation ([place])   . BPH (benign prostatic hyperplasia)   . Diabetes mellitus ([place])   . Hyperlipidemia   . Hypertension   . Other testicular hypofunction   . Type II or unspecified type diabetes mellitus without mention of complication, not stated as uncontrolled     Past Surgical History:  Procedure Laterality Date  . INGUINAL HERNIA REPAIR     1968 and 2004    Family History  Problem  Relation Age of Onset  . Sickle cell anemia Mother   . Diabetes Father   . Stomach cancer Father   . Colon cancer Neg Hx   . CAD Neg Hx     Social History:  Married. Retired--management in retail. Wife completed chemo a month ago--can provide supervision after discharge. Independent and active PTA. He reports that he has never smoked. He has never used smokeless tobacco. He reports that he does not drink alcohol or use drugs.    Allergies  Allergen Reactions  . Penicillins Other (See Comments)    Was told to "no take this" Has patient had a PCN reaction causing immediate rash, facial/tongue/throat swelling, SOB or lightheadedness with hypotension: Unknown Has patient had a PCN reaction causing severe rash involving mucus membranes or skin necrosis: Unknown Has patient had a PCN reaction that required hospitalization: Unknown Has patient had a PCN reaction occurring within the last 10 years: No If all of the above answers are "NO", then may proceed with Cephalosporin use.     Medications Prior to Admission  Medication Sig Dispense Refill  . aspirin 81 MG tablet Take 81 mg by mouth daily.    [name] atenolol (TENORMIN) 100 MG tablet TAKE 1 TABLET BY MOUTH  DAILY (Patient taking differently: Take 100 mg by mouth once a day) 90 tablet 1  . bisoprolol-hydrochlorothiazide (ZIAC) 10-6.25 MG tablet Take 1 tablet by mouth daily. 90 tablet 1  . Cholecalciferol (VITAMIN D PO) Take 5,000 Units by mouth daily.    . Cyanocobalamin (B-12) 50 MCG TABS Take 50 mcg by mouth daily.    [name]  finasteride (PROSCAR) 5 MG tablet TAKE 1 TABLET BY MOUTH  EVERY DAY FOR PROSTATE (Patient taking differently: Take 2.5 mg by mouth at bedtime) 90 tablet 1  . gabapentin (NEURONTIN) 300 MG capsule TAKE 1 TO 2 CAPSULES BY  MOUTH AT NIGHT FOR PAIN (Patient taking differently: Take 300-600 mg by mouth at bedtime for pain) 180 capsule 1  . glipiZIDE (GLUCOTROL) 5 MG tablet TAKE 1 TABLET BY MOUTH 3  TIMES DAILY BEFORE MEALS  FOR  DIABETES (Patient taking differently: Take 5 mg by mouth three times a day before meals for diabetes) 270 tablet 1  . levothyroxine (SYNTHROID, LEVOTHROID) 50 MCG tablet TAKE 1 TABLET BY MOUTH  DAILY BEFORE BREAKFAST (Patient taking differently: Take 25 mcg by mouth in the morning before breakfast) 90 tablet 1  . Magnesium 250 MG TABS Take 250 mg by mouth daily.    . metFORMIN (GLUCOPHAGE-XR) 500 MG 24 hr tablet TAKE 1 TABLET BY MOUTH 4  TIMES DAILY FOR DIABETES (Patient taking differently: Take 500 mg by mouth four times a day for diabetes) 360 tablet 1  . Multiple Vitamin (MULTIVITAMIN) capsule Take 1 capsule by mouth daily.      . potassium chloride (KLOR-CON) 10 MEQ CR tablet Take 10 mEq by mouth daily.      . quinapril (ACCUPRIL) 40 MG tablet TAKE 1 TABLET BY MOUTH  EVERY DAY FOR BLOOD  PRESSURE (Patient taking differently: Take 40 mg by mouth once a day for blood pressure) 90 tablet 1  . simvastatin (ZOCOR) 40 MG tablet TAKE 1 TABLET BY MOUTH AT  BEDTIME OR AS DIRECTED FOR  CHOLESTEROL (Patient taking differently: Take 20 mg by mouth in the evening on Mon/Wed/Fri) 90 tablet 1  . vitamin C (ASCORBIC ACID) 500 MG tablet Take 500 mg by mouth daily.        Drug Regimen Review Drug regimen was reviewed and the following issues were identified and addressed : changes/holds made as noted in the medical problem list  Home: Home Living Family/patient expects to be discharged to:: Private residence Living Arrangements: Spouse/significant other (daughter in [place], son in [place]) Available Help at Discharge: Family, Available 24 hours/day Type of Home: House Home Access: Stairs to enter [employer] of Steps:  (front; 2 steps through garage) Entrance Stairs-Rails: None Home Layout: One level, Multi-level Alternate Level Stairs-Number of Steps:  (through garage; son to have rails installed before pt discha) Alternate Level Stairs-Rails: None Bathroom Shower/Tub: Tub/shower unit,  Door [employer] Toilet: Standard Bathroom Accessibility: Yes Home Equipment: None  Lives With: Spouse   Functional History: Prior Function Level of Independence: Independent Comments: drives; retired; 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; 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  Functional Status:  Mobility: Bed Mobility Overal bed mobility: Needs Assistance Bed Mobility: Supine to Sit Supine to sit: Min guard Sit to supine: Min assist General bed mobility comments: assist for balance Transfers Overall transfer level: Needs assistance Equipment used: Rolling walker (2 wheeled) Transfers: Sit to/from Stand Sit to Stand: Min assist Stand pivot transfers: Mod assist, +2 safety/equipment General transfer comment: cues for hand placement, steadying assist  Ambulation/Gait Ambulation/Gait assistance: Min assist, Mod assist Ambulation Distance (Feet): 125 Feet Assistive device: Rolling walker (2 wheeled) Gait Pattern/deviations: Step-to pattern, Step-through pattern, Decreased stride length, Narrow base of support, Ataxic General Gait Details: cues to right head due to leaning L, cues throughout for visual orientation to upright, assist for balance, step width    ADL: ADL Overall ADL's : Needs assistance/impaired Eating/Feeding: Set up  Grooming: Sitting, Minimal assistance Grooming Details (indicate cue type and reason): Difficulty completing ADL unsupported - falls L Upper Body Bathing: Minimal assistance, Sitting Lower Body Bathing: Moderate assistance, Sit to/from stand Upper Body Dressing : Moderate assistance, Sitting Lower Body Dressing: Moderate assistance, Sit to/from stand Toilet Transfer: Moderate assistance, RW, Stand-pivot (+2 would be helpful) Toilet Transfer Details (indicate cue type and reason): simulated Toileting- Clothing Manipulation and Hygiene: Moderate assistance Toileting - Clothing Manipulation Details (indicate cue type and reason): unable to release RW and manipulate  clothing Functional mobility during ADLs: Moderate assistance, +2 for safety/equipment, Rolling walker, Cueing for safety, Cueing for sequencing General ADL Comments: Pt falls toward L when unsupported sitting EOB. motivated to complete ADL independently.  Cognition: Cognition Overall Cognitive Status: Within Functional Limits for tasks assessed Orientation Level: Oriented X4 Cognition Arousal/Alertness: Awake/alert Behavior During Therapy: WFL for tasks assessed/performed Overall Cognitive Status: Within Functional Limits for tasks assessed General Comments: internally distracted by symptoms   Blood pressure (!) 178/91, pulse 87, temperature 99 F (37.2 C), temperature source Oral, resp. rate 18, height 5' 11.5" (1.816 m), weight 82.1 kg (181 lb 1.6 oz), SpO2 98 %. Physical Exam  Nursing note and vitals reviewed. Constitutional: He is oriented to person, place, and time. He appears well-developed and well-nourished. No distress.  HENT:  Head: Normocephalic and atraumatic.  Mouth/Throat: Oropharynx is clear and moist.  Eyes: Pupils are equal, round, and reactive to light. Conjunctivae and EOM are normal.  Neck: Normal range of motion. Neck supple.  Cardiovascular: Normal rate and regular rhythm.  Exam reveals no friction rub.   No murmur heard. Respiratory: Effort normal and breath sounds normal. No stridor. No respiratory distress. He has no wheezes.  GI: Soft. Bowel sounds are normal. He exhibits no distension. There is no tenderness.  Musculoskeletal: He exhibits no edema or tenderness.  Neurological: He is alert and oriented to person, place, and time.  Speech clear. Follow commands without difficulty. Dysmetria RUE with finger to nose and with right heel to shin. RUE/RLE grossly 4/5 prox to distal. LUE and LLE 4+ to 5/5. No sensory deficits.   Skin: Skin is warm and dry. He is not diaphoretic.  Psychiatric: He has a normal mood and affect. His behavior is normal. Judgment and  thought content normal.    Results for orders placed or performed during the hospital encounter of 07/25/17 (from the past 48 hour(s))  Glucose, capillary     Status: Abnormal   Collection Time: 07/26/17 12:54 PM  Result Value Ref Range   Glucose-Capillary 268 (H) 65 - 99 mg/dL  Glucose, capillary     Status: Abnormal   Collection Time: 07/26/17  4:21 PM  Result Value Ref Range   Glucose-Capillary 186 (H) 65 - 99 mg/dL  Glucose, capillary     Status: Abnormal   Collection Time: 07/27/17  1:19 AM  Result Value Ref Range   Glucose-Capillary 217 (H) 65 - 99 mg/dL   Comment 1 Notify RN    Comment 2 Document in Chart   CBC     Status: Abnormal   Collection Time: 07/27/17  2:53 AM  Result Value Ref Range   WBC 6.5 4.0 - 10.5 K/uL   RBC 4.61 4.22 - 5.81 MIL/uL   Hemoglobin 13.0 13.0 - 17.0 g/dL   HCT 38.8 (L) 39.0 - 52.0 %   MCV 84.2 78.0 - 100.0 fL   MCH 28.2 26.0 - 34.0 pg   MCHC 33.5 30.0 - 36.0 g/dL  RDW 13.6 11.5 - 15.5 %   Platelets 171 150 - 400 K/uL  Basic metabolic panel     Status: Abnormal   Collection Time: 07/27/17  2:53 AM  Result Value Ref Range   Sodium 139 135 - 145 mmol/L   Potassium 4.0 3.5 - 5.1 mmol/L   Chloride 106 101 - 111 mmol/L   CO2 26 22 - 32 mmol/L   Glucose, Bld 221 (H) 65 - 99 mg/dL   BUN 10 6 - 20 mg/dL   Creatinine, Ser 1.13 0.61 - 1.24 mg/dL   Calcium 8.7 (L) 8.9 - 10.3 mg/dL   GFR calc non Af Amer >60 >60 mL/min   GFR calc Af Amer >60 >60 mL/min    Comment: (NOTE) The eGFR has been calculated using the CKD EPI equation. This calculation has not been validated in all clinical situations. eGFR's persistently <60 mL/min signify possible Chronic Kidney Disease.    Anion gap 7 5 - 15  Glucose, capillary     Status: Abnormal   Collection Time: 07/27/17  6:14 AM  Result Value Ref Range   Glucose-Capillary 215 (H) 65 - 99 mg/dL   Comment 1 Notify RN    Comment 2 Document in Chart   Glucose, capillary     Status: Abnormal   Collection Time:  07/27/17 11:14 AM  Result Value Ref Range   Glucose-Capillary 303 (H) 65 - 99 mg/dL  Glucose, capillary     Status: Abnormal   Collection Time: 07/27/17  4:16 PM  Result Value Ref Range   Glucose-Capillary 251 (H) 65 - 99 mg/dL  Glucose, capillary     Status: Abnormal   Collection Time: 07/27/17  9:40 PM  Result Value Ref Range   Glucose-Capillary 271 (H) 65 - 99 mg/dL   Comment 1 Notify RN    Comment 2 Document in Chart   CBC     Status: None   Collection Time: 07/28/17  4:16 AM  Result Value Ref Range   WBC 7.8 4.0 - 10.5 K/uL   RBC 4.98 4.22 - 5.81 MIL/uL   Hemoglobin 14.3 13.0 - 17.0 g/dL   HCT 41.4 39.0 - 52.0 %   MCV 83.1 78.0 - 100.0 fL   MCH 28.7 26.0 - 34.0 pg   MCHC 34.5 30.0 - 36.0 g/dL   RDW 13.4 11.5 - 15.5 %   Platelets 191 150 - 400 K/uL  Basic metabolic panel     Status: Abnormal   Collection Time: 07/28/17  4:16 AM  Result Value Ref Range   Sodium 137 135 - 145 mmol/L   Potassium 4.1 3.5 - 5.1 mmol/L   Chloride 105 101 - 111 mmol/L   CO2 22 22 - 32 mmol/L   Glucose, Bld 281 (H) 65 - 99 mg/dL   BUN 11 6 - 20 mg/dL   Creatinine, Ser 1.09 0.61 - 1.24 mg/dL   Calcium 8.6 (L) 8.9 - 10.3 mg/dL   GFR calc non Af Amer >60 >60 mL/min   GFR calc Af Amer >60 >60 mL/min    Comment: (NOTE) The eGFR has been calculated using the CKD EPI equation. This calculation has not been validated in all clinical situations. eGFR's persistently <60 mL/min signify possible Chronic Kidney Disease.    Anion gap 10 5 - 15  Glucose, capillary     Status: Abnormal   Collection Time: 07/28/17  6:50 AM  Result Value Ref Range   Glucose-Capillary 261 (H) 65 - 99 mg/dL  Comment 1 Notify RN    Comment 2 Document in Chart    Mr Brain Wo Contrast  Result Date: 07/26/2017 CLINICAL DATA:  Initial evaluation for acute stroke. EXAM: MRI HEAD WITHOUT CONTRAST TECHNIQUE: Multiplanar, multiecho pulse sequences of the brain and surrounding structures were obtained without intravenous  contrast. COMPARISON:  Comparison made with prior CTs from 07/25/2017 as well as 07/26/2017. FINDINGS: Brain: Age-appropriate cerebral atrophy. No significant cerebral white matter for age. Confluent restricted diffusion involving the superior right cerebellar hemisphere, compatible with acute ischemic infarct, right superior cerebral artery territory. No significant mass effect. Scattered petechial hemorrhage without evidence for frank hemorrhagic transformation (series 9, image 24). No other acute infarct. Gray-white matter differentiation otherwise maintained. No other evidence for acute or chronic intracranial hemorrhage. No mass lesion, midline shift or mass effect. No hydrocephalus. No extra-axial fluid collection. Vascular: Major intracranial vascular flow voids are maintained. Skull and upper cervical spine: Craniocervical junction normal. Visualized upper cervical spine unremarkable. Bone marrow signal intensity within normal limits. No scalp soft tissue abnormality. Sinuses/Orbits: Globes and orbital soft tissues within normal limits. Patient status post lens extraction bilaterally. Paranasal sinuses are clear. Trace right mastoid effusion noted. Inner ear structures normal. Other: None. IMPRESSION: 1. Acute ischemic superior right cerebellar infarct without significant mass effect. Associated mild petechial hemorrhage without frank hemorrhagic transformation. 2. Otherwise normal brain MRI. Electronically Signed   By: Jeannine Boga M.D.   On: 07/26/2017 12:57       Medical Problem List and Plan: 1.  Functional and mobility deficits secondary to right cerebellar infarct  -admit to inpatient rehab 2.  DVT Prophylaxis/Anticoagulation: Pharmaceutical: Lovenox 3. Neuropaty/Pain Management: Continue gabapentin.  4. Mood: patient very motivated. LCSW to follow for evaluation and support.  5. Neuropsych: This patient is capable of making decisions on his own behalf. 6. Skin/Wound Care: routine  pressure relief measures 7. Fluids/Electrolytes/Nutrition: Monitor I/O. Check lytes in am. 8. HTN: Monitor BP bid--permissive HTN for a few days. Was on Ziac, Accupril  and Metoprolol PTA. Continue Proscar.  9. T2DM: Hgb A1c- 6.7. Monitor BS ac/hs. Po intake good--resume glucotrol. Hold metformin due to recent procedure--resume next week. Continue to use SSI for elevated BS.  10. Hypothyroid: On supplement 11. BPH: On proscar.    Post Admission Physician Evaluation: 1. Functional deficits secondary  to right cerebellar infarct. 2. Patient is admitted to receive collaborative, interdisciplinary care between the physiatrist, rehab nursing staff, and therapy team. 3. Patient's level of medical complexity and substantial therapy needs in context of that medical necessity cannot be provided at a lesser intensity of care such as a SNF. 4. Patient has experienced substantial functional loss from his/her baseline which was documented above under the "Functional History" and "Functional Status" headings.  Judging by the patient's diagnosis, physical exam, and functional history, the patient has potential for functional progress which will result in measurable gains while on inpatient rehab.  These gains will be of substantial and practical use upon discharge  in facilitating mobility and self-care at the household level. 5. Physiatrist will provide 24 hour management of medical needs as well as oversight of the therapy plan/treatment and provide guidance as appropriate regarding the interaction of the two. 6. The Preadmission Screening has been reviewed and patient status is unchanged unless otherwise stated above. 7. 24 hour rehab nursing will assist with bladder management, bowel management, safety, skin/wound care, disease management, medication administration, pain management and patient education  and help integrate therapy concepts, techniques,education, etc. 8. PT  will assess and treat for/with: Lower  extremity strength, range of motion, stamina, balance, functional mobility, safety, adaptive techniques and equipment, NMR, vestibular assessment, community reentry.   Goals are: mod I/supervision. 9. OT will assess and treat for/with: ADL's, functional mobility, safety, upper extremity strength, adaptive techniques and equipment, NMR, vestibular mgt, ego support.   Goals are: mod I to supervision. Therapy may proceed with showering this patient. 10. SLP will assess and treat for/with: n/a.  Goals are: n/a. 11. Case Management and Social Worker will assess and treat for psychological issues and discharge planning. 12. Team conference will be held weekly to assess progress toward goals and to determine barriers to discharge. 13. Patient will receive at least 3 hours of therapy per day at least 5 days per week. 14. ELOS: 8-13 days       15. Prognosis:  excellent     Meredith Staggers, MD, [facility] 07/28/2017  Bary Leriche, Hershal Coria 07/28/2017

## 2017-07-28 NOTE — Progress Notes (Signed)
Inpatient Diabetes Program Recommendations  AACE/ADA: New Consensus Statement on Inpatient Glycemic Control (2015)  Target Ranges:  Prepandial:   less than 140 mg/dL      Peak postprandial:   less than 180 mg/dL (1-2 hours)      Critically ill patients:  140 - 180 mg/dL   Results for TANUJ, MULLENS (MRN [id number]) as of 07/28/2017 13:01  Ref. Range 07/27/2017 11:14 07/27/2017 16:16 07/27/2017 21:40 07/28/2017 06:50 07/28/2017 11:24  Glucose-Capillary Latest Ref Range: 65 - 99 mg/dL 303 (H) 251 (H) 271 (H) 261 (H) 201 (H)   Review of Glycemic Control  Diabetes history: DM 2 Outpatient Diabetes medications: Glipizide 5 mg tid, Metformin 500 mg 4x/day Current orders for Inpatient glycemic control: Novolog Sensitive 0-9 units tid  Inpatient Diabetes Program Recommendations:    Glucose elevated in the 200's. Consider increasing Novolog Correction to Moderate scale 0-15 units tid + Novolog HS scale. May even consider Lantus 10 units if hyperglycemia persists.  Current; A1c 6.7%  Thanks,  Tama Headings RN, MSN, [facility] Inpatient Diabetes Coordinator Team Pager [phone] (8a-5p)

## 2017-07-28 NOTE — Progress Notes (Signed)
STROKE TEAM PROGRESS NOTE   HISTORY OF PRESENT ILLNESS (per record)  Earl [name] is a 76 y.o. male with a history of diabetes and hypertension who presented with nausea and dizziness. He has atrial fibrillation documented in his past medical history, but according to his most recent cardiology's note (2013) there was no evidence for this  Even after 21 day heart monitorand the patient states he was never diagnosed with it.  The pt takes 81 mg ASA daily.  He was in his normal state of health until the morning of 07/25/2017 around 4 AM when he noticed that he was very dizzy. He is also nauseated. When he was better this afternoon, he sought further care and had a CT scan which demonstrates a moderate sized cerebellar infarct.  Patient was not administered IV t-PA secondary to arriving outside of the tPA treatment window. He was admitted to General Neurology for further evaluation and treatment.   SUBJECTIVE (INTERVAL HISTORY) His wife and son are at the bedside.  The patient is awake, alert, and follows all commands appropriately.  Suspect etiology of stroke is atrial fibrillation without anticoagulation but AFIB not definitely demonstrated yet.  TEE normal  loop recorder pending   OBJECTIVE Temp:  [98.6 F (37 C)-100.4 F (38 C)] 99.5 F (37.5 C) (09/07 1856) Pulse Rate:  [83-107] 107 (09/07 1856) Cardiac Rhythm: Normal sinus rhythm (09/07 0757) Resp:  [16-28] 20 (09/07 1856) BP: (142-187)/(80-97) 148/90 (09/07 1856) SpO2:  [97 %-100 %] 100 % (09/07 1856)  CBC:   Recent Labs Lab 07/25/17 1720  07/27/17 0253 07/28/17 0416  WBC 8.5  --  6.5 7.8  NEUTROABS 6.9  --   --   --   HGB 13.2  < > 13.0 14.3  HCT 38.2*  < > 38.8* 41.4  MCV 83.2  --  84.2 83.1  PLT 198  --  171 191  < > = values in this interval not displayed.  Basic Metabolic Panel:   Recent Labs Lab 07/27/17 0253 07/28/17 0416  NA 139 137  K 4.0 4.1  CL 106 105  CO2 26 22  GLUCOSE 221* 281*  BUN 10 11   CREATININE 1.13 1.09  CALCIUM 8.7* 8.6*    Lipid Panel:     Component Value Date/Time   CHOL 147 07/26/2017 0551   TRIG 45 07/26/2017 0551   HDL 51 07/26/2017 0551   CHOLHDL 2.9 07/26/2017 0551   VLDL 9 07/26/2017 0551   LDLCALC 87 07/26/2017 0551   HgbA1c:  Lab Results  Component Value Date   HGBA1C 6.7 (H) 07/26/2017   Urine Drug Screen:     Component Value Date/Time   LABOPIA NONE DETECTED 07/25/2017 1911   COCAINSCRNUR NONE DETECTED 07/25/2017 1911   LABBENZ NONE DETECTED 07/25/2017 1911   AMPHETMU NONE DETECTED 07/25/2017 1911   THCU NONE DETECTED 07/25/2017 1911   LABBARB NONE DETECTED 07/25/2017 1911    Alcohol Level     Component Value Date/Time   ETH <5 07/25/2017 1720    IMAGING  Ct Angio Head W Or Wo Contrast Ct Angio Neck W Or Wo Contrast 07/26/2017 IMPRESSION: 1. Patent carotid and vertebral arteries. No dissection, aneurysm, or hemodynamically significant stenosis utilizing NASCET criteria. 2. Patent circle of Willis. No large vessel occlusion, aneurysm, or significant stenosis. 3. Right AICA distribution late acute/early subacute infarction. 4. No hemorrhage or abnormal enhancement in the brain. 5. Lipoma within strap muscles anterior to larynx. 6. Large nodule in left lobe of thyroid,  stable from prior thyroid ultrasounds.  Ct Head Wo Contrast 07/25/2017 IMPRESSION: 1. Acute right cerebellar SCA territory infarct. 2. No intracranial hemorrhage.  Mr Brain Wo Contrast 07/26/2017 IMPRESSION: 1. Acute ischemic superior right cerebellar infarct without significant mass effect. Associated mild petechial hemorrhage without frank hemorrhagic transformation. 2. Otherwise normal brain MRI.  TTE 07/26/2017 Study Conclusions - Left ventricle: The cavity size was normal. Wall thickness was   increased in a pattern of mild LVH. Systolic function was normal.   The estimated ejection fraction was in the range of 55% to 60%.   Wall motion was normal; there were no  regional wall motion   abnormalities. Features are consistent with a pseudonormal left   ventricular filling pattern, with concomitant abnormal relaxation   and increased filling pressure (grade 2 diastolic dysfunction). - Aortic valve: There was mild regurgitation. - Mitral valve: There was mild regurgitation. - Right ventricle: The cavity size was mildly dilated. - Pulmonary arteries: Systolic pressure was mildly increased. PA   peak pressure: 39 mm Hg (S). Impressions: - Normal LV systolic function; moderate diastolic dysfunction; mild   LVH; mild AI; mild MR; mild RVE; mild TR with mildly elevated   pulmonary pressure.   TEE 07/28/17 normal  PHYSICAL EXAM Pleasant elderly male not in distress. . Afebrile. Head is nontraumatic. Neck is supple without bruit.    Cardiac exam no murmur or gallop. Lungs are clear to auscultation. Distal pulses are well felt. Neurological Exam ;  Awake  Alert oriented x 3. Normal speech and language.eye movements full without nystagmus.fundi were not visualized. Vision acuity and fields appear normal. Hearing is normal. Palatal movements are normal. Face symmetric. Tongue midline. Normal strength, tone, reflexes and coordination. Normal sensation. Gait deferred.  ASSESSMENT/PLAN [name] is a 76 y.o. male with history of diabetes and hypertension who presented with nausea and dizziness. He did not receive IV t-PA due to arriving outside of the tPA treatment window.   Stroke:  Acute ischemic superior right cerebellar infarct with associated mild petechial hemorrhage, likely embolic, in the setting of questionable history of atrial fibrillation without anticoagulation.  Resultant  No deficits  CT head: no acute stroke  MRI head: acute ischemic superior right cerebellar infarct without significant mass effect. Associated mild petechial hemorrhage without frank hemorrhagic transformation.  MRA head: not performed  CTA head/neck: Right AICA  distribution late acute/early subacute infarction  2D Echo: EF 55-60%. No source of embolus   LDL 87  HgbA1c 6.6  Lovenox 40 mg sq daily for VTE prophylaxis Diet heart healthy/carb modified Room service appropriate? Yes; Fluid consistency: Thin  aspirin 81 mg daily prior to admission, now on aspirin 325 mg daily and clopidogrel 75 mg daily  Patient counseled to be compliant with his antithrombotic medications  Ongoing aggressive stroke risk factor management  Therapy recommendations:  CIR  Disposition:  pending  Hypertension  Stable  Permissive hypertension (OK if < 220/120) but gradually normalize in 5-7 days  Long-term BP goal normotensive  Hyperlipidemia  Home meds: simvastatin 40 mg PO daily, resumed in hospital  LDL 87, goal < 70  Stop simvastatin, add atorvastatin 40 mg PO daily  Continue atorvastatin at discharge  Diabetes  HgbA1c 6.6, goal < 7.0  Controlled  Other Stroke Risk Factors  Advanced age  Family hx sickle cell disease  ?atrial fibrillation without anticoagulation  Other Active Problems  None  Hospital day # 0  I have personally examined this patient, reviewed notes, independently viewed imaging studies,  participated in medical decision making and plan of care.ROS completed by me personally and pertinent positives fully documented  I have made any additions or clarifications directly to the above note. 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 F/U in stroke clinic in 6 weeks. Stroke team will sign off. D/W son and wife Earl [name], Earl [name] Pager: [phone] 07/28/2017 8:53 PM   To contact Stroke Continuity provider, please refer to  [email or web address] After hours, contact General Neurologyand care minute and he and he is a ramp

## 2017-07-29 ENCOUNTER — Inpatient Hospital Stay (HOSPITAL_COMMUNITY): Payer: Medicare Other

## 2017-07-29 ENCOUNTER — Inpatient Hospital Stay (HOSPITAL_COMMUNITY): Payer: Medicare Other | Admitting: Physical Therapy

## 2017-07-29 ENCOUNTER — Encounter (HOSPITAL_COMMUNITY): Payer: Self-pay | Admitting: *Deleted

## 2017-07-29 DIAGNOSIS — I639 Cerebral infarction, unspecified: Secondary | ICD-10-CM

## 2017-07-29 LAB — CBC WITH DIFFERENTIAL/PLATELET
Basophils Absolute: 0 10*3/uL (ref 0.0–0.1)
Basophils Relative: 0 %
Eosinophils Absolute: 0.1 10*3/uL (ref 0.0–0.7)
Eosinophils Relative: 1 %
HEMATOCRIT: 41.9 % (ref 39.0–52.0)
HEMOGLOBIN: 14.4 g/dL (ref 13.0–17.0)
LYMPHS ABS: 2.5 10*3/uL (ref 0.7–4.0)
LYMPHS PCT: 34 %
MCH: 28.6 pg (ref 26.0–34.0)
MCHC: 34.4 g/dL (ref 30.0–36.0)
MCV: 83.3 fL (ref 78.0–100.0)
MONO ABS: 0.9 10*3/uL (ref 0.1–1.0)
MONOS PCT: 13 %
NEUTROS ABS: 3.8 10*3/uL (ref 1.7–7.7)
NEUTROS PCT: 52 %
Platelets: 174 10*3/uL (ref 150–400)
RBC: 5.03 MIL/uL (ref 4.22–5.81)
RDW: 13.3 % (ref 11.5–15.5)
WBC: 7.3 10*3/uL (ref 4.0–10.5)

## 2017-07-29 LAB — COMPREHENSIVE METABOLIC PANEL
ALK PHOS: 72 U/L (ref 38–126)
ALT: 19 U/L (ref 17–63)
AST: 25 U/L (ref 15–41)
Albumin: 3.1 g/dL — ABNORMAL LOW (ref 3.5–5.0)
Anion gap: 10 (ref 5–15)
BUN: 11 mg/dL (ref 6–20)
CALCIUM: 8.7 mg/dL — AB (ref 8.9–10.3)
CHLORIDE: 104 mmol/L (ref 101–111)
CO2: 23 mmol/L (ref 22–32)
CREATININE: 1.23 mg/dL (ref 0.61–1.24)
GFR calc non Af Amer: 55 mL/min — ABNORMAL LOW (ref >60)
GLUCOSE: 254 mg/dL — AB (ref 65–99)
Potassium: 3.9 mmol/L (ref 3.5–5.1)
Sodium: 137 mmol/L (ref 135–145)
Total Bilirubin: 1 mg/dL (ref 0.3–1.2)
Total Protein: 5.8 g/dL — ABNORMAL LOW (ref 6.5–8.1)

## 2017-07-29 NOTE — Evaluation (Signed)
Occupational Therapy Assessment and Plan  Patient Details  Name: Earl [name] MRN: [id number] Date of Birth: 02-04-1941  OT Diagnosis: ataxia, disturbance of vision, hemiplegia affecting dominant side and muscle weakness (generalized) Rehab Potential: Rehab Potential (ACUTE ONLY): Good ELOS: 7-10   Today's Date: 07/29/2017 OT Individual Time: 1430-1500 and 800-900 OT Individual Time Calculation (min): 30 min   And 60 min (first session)  Problem List:  Patient Active Problem List   Diagnosis Date Noted  . Cerebellar stroke ([place])   . Dizziness 07/25/2017  . Hypothyroidism 09/13/2016  . BMI 26.53,adult 08/28/2015  . Medicare annual wellness visit, subsequent 08/28/2015  . 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 Vitamin D deficiency 04/29/2014  . Medication management 01/03/2014  . 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 BPH (benign prostatic hyperplasia)   . Testosterone deficiency   . 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 Anemia 11/07/2013  . Hypertension 11/07/2013  . Hyperlipidemia 11/07/2013  . Paroxysmal atrial fibrillation ([place]) 09/18/2012    Past Medical History:  Past Medical History:  Diagnosis Date  . Atrial fibrillation ([place])   . BPH (benign prostatic hyperplasia)   . Diabetes mellitus ([place])   . Hyperlipidemia   . Hypertension   . Other testicular hypofunction   . Type II or unspecified type diabetes mellitus without mention of complication, not stated as uncontrolled    Past Surgical History:  Past Surgical History:  Procedure Laterality Date  . INGUINAL HERNIA REPAIR     1968 and 2004    Assessment & Plan Clinical Impression: Earl [name] a 76 y.o.malewith history of HTN, questionable history PAF, T2DM; who was admitted on 07/25/17 with dizziness, nausea and right sided weakness. History taken from chart review and patient. CT head reviewed, showing acute superior right cerebellar infarct.  CTA head/neck done revealing right AICA late acute/early subacute infarct, no vascular abnormality. Patient started on ASA for stroke prophylaxis and TEE ordered for work up of embolic stroke  Therapy ongoing with limitations in mobility and self care tasks. CIR recommended for follow up therapy .    Patient currently requires min with basic self-care skills secondary to muscle weakness, decreased cardiorespiratoy endurance, ataxia and decreased coordination, decreased visual motor skills, decreased midline orientation and decreased safety awareness.  Prior to hospitalization, patient could complete BADLwith independent .  Patient will benefit from skilled intervention to increase independence with basic self-care skills prior to discharge home with care partner.  Anticipate patient will require 24 hour supervision and follow up home health.  OT - End of Session Endurance Deficit: Yes Endurance Deficit Description: decreased OT Assessment Rehab Potential (ACUTE ONLY): Good OT Basic ADL's Functional Problem(s): Bathing;Grooming;Toileting;Dressing OT Transfers Functional Problem(s): Toilet;Tub/Shower OT Additional Impairment(s): Fuctional Use of Upper Extremity OT Plan OT Intensity: Minimum of 1-2 x/day, 45 to 90 minutes OT Frequency: 5 out of 7 days OT Duration/Estimated Length of Stay: 7-10 OT Treatment/Interventions: Balance/vestibular training;Cognitive remediation/compensation;Community reintegration;Disease mangement/prevention;Discharge planning;DME/adaptive equipment instruction;Functional mobility training;Neuromuscular re-education;Pain management;Patient/family education;Psychosocial support;Self Care/advanced ADL retraining;Therapeutic Activities;Therapeutic Exercise;UE/LE Strength taining/ROM;UE/LE Coordination activities;Visual/perceptual remediation/compensation OT Self Feeding Anticipated Outcome(s): MOD I OT Basic Self-Care Anticipated Outcome(s): MOD I OT Toileting Anticipated  Outcome(s): MOD I toilet; bathing wiht S OT Bathroom Transfers Anticipated Outcome(s): MOD I toileting; Bathing wiht S OT Recommendation Patient destination: Home Follow Up Recommendations: Home health OT Equipment Recommended: To be determined   Skilled Therapeutic Intervention Session 1; Educated pt on OT role/pupose, POC, ELOS, CIR and stroke recover. Pt starting to  eat breakfast upon entering room. Encouraged pt to use RUE to self feed. Pt able to manipulate utensil with increased time dropping once. Pt supine>siting EOB with supervision and VC for sequencing. Pt sit to stand with RW with VC for safety awareness to ambulate and transfer onto TTB with min A for balance. OT provides verbal and tactile cues for posture, correcting L lean, and longer strides. Pt bathes at sit to stand level with CGA for washing buttocks as pt stabilizes LUE on grab bar. Pt dresses seated in recliner with CGA for standing to advance pants past buttocks with VC for terminal hip extension when standing. Exited session with pt sitting in recliner with call light in reach and all needs met.  Session 2: Pt propels w/c to/from all tx destinations for general conditioning, strengthening and coordination of BUE with VC for propulsion technique. Pt participates in discussion of home set up/shower set up and different DME as pt has glass doors on tub shower unit with out grab bars. OT educated on placement of grab bars, use of shower chair v TTB and non slip treads on bottom of tub. OT demonstrated use of TTB and pt completes ambulatory tub transfer onto TTB with touching A with 1 LOB to R with MAX A to recover with VC for sequencing safe transfer of shower. Exited session with pt seated in w/c with call light in reach and all needs met  OT Evaluation Precautions/Restrictions  Precautions Precautions: Fall Restrictions Weight Bearing Restrictions: No General   Vital Signs  Pain Pain Assessment Pain Assessment:  No/denies pain Home Living/Prior Functioning Home Living Available Help at Discharge: Family, Available 24 hours/day Type of Home: House Home Access: Stairs to enter [employer] of Steps: 3 in garage, 2 in front, no rails but son plans to install rail in garage steps Entrance Stairs-Rails: None Home Layout: One level Alternate Level Stairs-Number of Steps: 1 step down to enter into family room Alternate Level Stairs-Rails: None Bathroom Shower/Tub: Public librarian, Charity fundraiser: Standard Bathroom Accessibility: Yes  Lives With: Spouse IADL History Homemaking Responsibilities: Yes Meal Prep Responsibility: Secondary Laundry Responsibility: No Cleaning Responsibility: Secondary Bill Paying/Finance Responsibility: Secondary Shopping Responsibility: Secondary Current License: Yes Mode of Transportation: Car Type of Occupation: Archivist Leisure and Hobbies: go out to eat Prior Function Level of Independence: Independent with basic ADLs, Independent with gait  Able to Take Stairs?: Yes Driving: Yes Vocation: Retired Leisure: Hobbies-yes (Comment) Comments: drives; retired; 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; 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 ADL   Vision Baseline Vision/History: Wears glasses Wears Glasses: Reading only Patient Visual Report: Other (comment) (Pt reports "maybe a little different") Vision Assessment?: Yes Eye Alignment: Within Functional Limits Ocular Range of Motion: Within Functional Limits Alignment/Gaze Preference: Within Defined Limits Tracking/Visual Pursuits:  (Decreased smoothness of movement in all directions) Saccades: Overshoots Convergence: Impaired - to be further tested in functional context Visual Fields: No apparent deficits Depth Perception: Overshoots Perception  Perception: Within Functional Limits Comments: Appears to be [facility], will continue to assess in future sessions Praxis Praxis: Intact Cognition Overall  Cognitive Status: Within Functional Limits for tasks assessed Arousal/Alertness: Awake/alert Orientation Level: Person Year: 2018 Month: September Day of Week: Correct Memory: Appears intact Immediate Memory Recall: Sock;Blue;Bed Memory Recall: Sock;Blue;Bed Attention: Selective Sustained Attention: Appears intact Selective Attention: Appears intact Awareness: Appears intact Behaviors: Impulsive Safety/Judgment: Impaired Comments: Impulsive when asked to move, moves quickly and unsafely Sensation Sensation Light Touch: Appears Intact Proprioception: Appears Intact Coordination Gross Motor Movements  are Fluid and Coordinated: Yes Fine Motor Movements are Fluid and Coordinated: No Finger Nose Finger Test: overshoots/undershoots Motor  Motor Motor: Hemiplegia;Ataxia Motor - Skilled Clinical Observations: Mild R hemiplegia, mild ataxia w/ all movements Mobility  Bed Mobility Bed Mobility: Rolling Right;Rolling Left;Supine to Sit;Sit to Supine Rolling Right: 5: Supervision Rolling Right Details: Verbal cues for technique;Verbal cues for sequencing Rolling Left: 5: Supervision Rolling Left Details: Verbal cues for technique;Verbal cues for sequencing Supine to Sit: 5: Supervision Supine to Sit Details: Verbal cues for technique;Verbal cues for sequencing Sit to Supine: 5: Supervision Sit to Supine - Details: Verbal cues for technique;Verbal cues for sequencing Transfers Sit to Stand: 4: Min guard Stand to Sit: 4: Min guard  Trunk/Postural Assessment  Cervical Assessment Cervical Assessment: Within Functional Limits Thoracic Assessment Thoracic Assessment: Within Functional Limits Lumbar Assessment Lumbar Assessment: Within Functional Limits Postural Control Postural Control: Deficits on evaluation (absent/delayed)  Balance Balance Balance Assessed: Yes Static Sitting Balance Static Sitting - Balance Support: No upper extremity supported Static Sitting - Level of  Assistance: 5: Stand by assistance Dynamic Sitting Balance Dynamic Sitting - Balance Support: No upper extremity supported Dynamic Sitting - Level of Assistance: 5: Stand by assistance Sitting balance - Comments: trunkal ataxia Static Standing Balance Static Standing - Balance Support: Right upper extremity supported Static Standing - Level of Assistance: 4: Min assist Dynamic Standing Balance Dynamic Standing - Balance Support: Bilateral upper extremity supported Dynamic Standing - Level of Assistance: 4: Min assist Extremity/Trunk Assessment RUE Assessment RUE Assessment: Exceptions to [facility] (generalized weakness, decreased coordination) LUE Assessment LUE Assessment: Within Functional Limits   See Function Navigator for Current Functional Status.   Refer to Care Plan for Long Term Goals  Recommendations for other services: Therapeutic Recreation  Kitchen group and Outing/community reintegration   Discharge Criteria: Patient will be discharged from OT if patient refuses treatment 3 consecutive times without medical reason, if treatment goals not met, if there is a change in medical status, if patient makes no progress towards goals or if patient is discharged from hospital.  The above assessment, treatment plan, treatment alternatives and goals were discussed and mutually agreed upon: by patient  Tonny Branch 07/29/2017, 12:38 PM

## 2017-07-29 NOTE — Progress Notes (Signed)
Patient ID: Earl [name], male   DOB: Jun 04, 1941, 76 y.o.   MRN: [id number]   07/29/17.  Earl [name] is a 76 y.o. male  Admitted to rehabilitation yesterday following a superior right cerebellar stroke.  Patient has been on aspirin for stroke prophylaxis. Patient has history of hypertension, possible PAF as well as type 2 diabetes.  Past Medical History:  Diagnosis Date  . Atrial fibrillation ([place])   . BPH (benign prostatic hyperplasia)   . Diabetes mellitus ([place])   . Hyperlipidemia   . Hypertension   . Other testicular hypofunction   . Type II or unspecified type diabetes mellitus without mention of complication, not stated as uncontrolled       Subjective: No new complaints. No new problems.  Only complaint is some constipation issues.  Required a enema yesterday  Objective: Vital signs in last 24 hours: Temp:  [98.6 F (37 C)-100.4 F (38 C)] 99.1 F (37.3 C) (09/08 0440) Pulse Rate:  [83-107] 94 (09/08 0440) Resp:  [16-28] 18 (09/08 0440) BP: (142-187)/(80-97) 149/93 (09/08 0440) SpO2:  [97 %-100 %] 99 % (09/08 0440) Weight change:  Last BM Date: 07/28/17  Intake/Output from previous day: 09/07 0701 - 09/08 0700 In: -  Out: 750 [Urine:750] Last cbgs: CBG (last 3)   Recent Labs  07/28/17 1124 07/28/17 1313 07/28/17 1617  GLUCAP 201* 200* 349*     Physical Exam General: No apparent distress   HEENT: not dry Lungs: Normal effort. Lungs clear to auscultation, no crackles or wheezes. Cardiovascular: Regular rate and rhythm, no edema Abdomen: S/NT/ND; BS(+) Musculoskeletal:  unchanged Neurological: No new neurological deficits; right sided dyspraxia Wounds: N/A    Skin: clear  Mental state: Alert, oriented, cooperative    Lab Results: BMET    Component Value Date/Time   NA 137 07/29/2017 0605   NA 141 11/01/2016 0826   K 3.9 07/29/2017 0605   K 4.1 11/01/2016 0826   CL 104 07/29/2017 0605   CL 101 10/23/2012 0802   CO2 23 07/29/2017  0605   CO2 23 11/01/2016 0826   GLUCOSE 254 (H) 07/29/2017 0605   GLUCOSE 98 11/01/2016 0826   GLUCOSE 149 (H) 10/23/2012 0802   BUN 11 07/29/2017 0605   BUN 10.2 11/01/2016 0826   CREATININE 1.23 07/29/2017 0605   CREATININE 1.17 04/14/2017 1151   CREATININE 1.0 11/01/2016 0826   CALCIUM 8.7 (L) 07/29/2017 0605   CALCIUM 9.0 11/01/2016 0826   GFRNONAA 55 (L) 07/29/2017 0605   GFRNONAA 61 04/14/2017 1151   GFRAA >60 07/29/2017 0605   GFRAA 70 04/14/2017 1151   CBC    Component Value Date/Time   WBC 7.3 07/29/2017 0605   RBC 5.03 07/29/2017 0605   HGB 14.4 07/29/2017 0605   HGB 13.6 11/01/2016 0824   HCT 41.9 07/29/2017 0605   HCT 41.8 11/01/2016 0824   PLT 174 07/29/2017 0605   PLT 155 11/01/2016 0824   MCV 83.3 07/29/2017 0605   MCV 87.4 11/01/2016 0824   MCH 28.6 07/29/2017 0605   MCHC 34.4 07/29/2017 0605   RDW 13.3 07/29/2017 0605   RDW 13.8 11/01/2016 0824   LYMPHSABS 2.5 07/29/2017 0605   LYMPHSABS 2.0 11/01/2016 0824   MONOABS 0.9 07/29/2017 0605   MONOABS 0.5 11/01/2016 0824   EOSABS 0.1 07/29/2017 0605   EOSABS 0.2 11/01/2016 0824   BASOSABS 0.0 07/29/2017 0605   BASOSABS 0.0 11/01/2016 0824   BP Readings from Last 3 Encounters:  07/29/17 (!) 149/93  07/28/17 [name])  154/88  04/14/17 124/72   CBG (last 3)   Recent Labs  07/28/17 1124 07/28/17 1313 07/28/17 1617  GLUCAP 201* 200* 349*    Medications: I have reviewed the patient's current medications.  Assessment/Plan:  Functional and mobility deficits secondary to right cerebellar infarct.  Continue CIR Hypertension.  Blood pressure slightly elevated today.  We'll continue to monitor on present regimen Diabetes mellitus.  Will continue to monitor and use SSI for glycemic control.  Metformin.  Presently on hold.  Continue Glucotrol    Length of stay, days: [place] , MD 07/29/2017, 9:05 AM

## 2017-07-29 NOTE — Evaluation (Signed)
Physical Therapy Assessment and Plan  Patient Details  Name: Earl [name] MRN: [id number] Date of Birth: 02-24-1941  PT Diagnosis: Abnormal posture, Abnormality of gait, Ataxia, Ataxic gait, Coordination disorder, Difficulty walking, Dizziness and giddiness, Hemiplegia and Muscle weakness Rehab Potential: Excellent ELOS: 7-10 days   Today's Date: 07/29/2017 PT Individual Time: 1100-1200 AND 1330-1411 PT Individual Time Calculation (min): 60 min AND 41 min  Problem List:  Patient Active Problem List   Diagnosis Date Noted  . Cerebellar stroke ([place])   . Dizziness 07/25/2017  . Hypothyroidism 09/13/2016  . BMI 26.53,adult 08/28/2015  . Medicare annual wellness visit, subsequent 08/28/2015  . 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 Vitamin D deficiency 04/29/2014  . Medication management 01/03/2014  . 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 BPH (benign prostatic hyperplasia)   . Testosterone deficiency   . 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 Anemia 11/07/2013  . Hypertension 11/07/2013  . Hyperlipidemia 11/07/2013  . Paroxysmal atrial fibrillation ([place]) 09/18/2012    Past Medical History:  Past Medical History:  Diagnosis Date  . Atrial fibrillation ([place])   . BPH (benign prostatic hyperplasia)   . Diabetes mellitus ([place])   . Hyperlipidemia   . Hypertension   . Other testicular hypofunction   . Type II or unspecified type diabetes mellitus without mention of complication, not stated as uncontrolled    Past Surgical History:  Past Surgical History:  Procedure Laterality Date  . [place] and 2004    Assessment & Plan Clinical Impression: Patient is a 76 y.o.malewith history of HTN, questionable history PAF, T2DM; who was admitted on 07/25/17 with dizziness, nausea and right sided weakness. History taken from chart review and patient. CT head reviewed, showing acute superior right cerebellar  infarct. CTA head/neck done revealing right AICA late acute/early subacute infarct, no vascular abnormality. Patient started on ASA for stroke prophylaxis and TEE ordered for work up of embolic stroke. Therapy ongoing with limitations in mobility and self care tasks. Patient transferred to CIR on 07/28/2017 .   Patient currently requires min with mobility secondary to muscle weakness, decreased cardiorespiratoy endurance, impaired timing and sequencing, ataxia and decreased coordination, and decreased standing balance, hemiplegia and decreased balance strategies.  Prior to hospitalization, patient was independent  with mobility and lived with Spouse in a House home.  Home access is 3 in garage, 2 in front, no rails but son plans to install rail in garage stepsStairs to enter.  Patient will benefit from skilled PT intervention to maximize safe functional mobility, minimize fall risk and decrease caregiver burden for planned discharge home with 24 hour supervision.  Anticipate patient will benefit from follow up OP at discharge.  PT - End of Session Activity Tolerance: Tolerates 10 - 20 min activity with multiple rests Endurance Deficit: Yes Endurance Deficit Description: decreased PT Assessment Rehab Potential (ACUTE/IP ONLY): Excellent PT Barriers to Discharge: [place] home environment;Decreased caregiver support;Home environment access/layout;Lack of/limited family support PT Barriers to Discharge Comments: wife may be unable to physically assist, 1 step to enter into family room PT Patient demonstrates impairments in the following area(s): Balance;Behavior;Endurance;Motor;Safety PT Transfers Functional Problem(s): Bed Mobility;Bed to Chair;Car;Furniture;Floor PT Locomotion Functional Problem(s): Wheelchair Mobility;Ambulation;Stairs PT Plan PT Intensity: Minimum of 1-2 x/day ,45 to 90 minutes PT Frequency: 5 out of 7 days PT Duration Estimated Length of Stay: 7-10 days PT  Treatment/Interventions: Visual/perceptual remediation/compensation;Stair training;Pain management;Disease management/prevention;Ambulation/gait training;Balance/vestibular training;DME/adaptive equipment instruction;Therapeutic Activities;Patient/family education;Wheelchair propulsion/positioning;Psychosocial  support;Therapeutic Exercise;Functional electrical stimulation;Cognitive remediation/compensation;Community reintegration;Functional mobility training;Skin care/wound management;UE/LE Strength taining/ROM;UE/LE Coordination activities;Splinting/orthotics;Neuromuscular re-education;Discharge planning PT Transfers Anticipated Outcome(s): supervision PT Locomotion Anticipated Outcome(s): supervision PT Recommendation Recommendations for Other Services: Therapeutic Recreation consult Therapeutic Recreation Interventions: Outing/community reintergration Follow Up Recommendations: Outpatient PT (OPPT if wife is able to drive ) Patient destination: Home Equipment Recommended: To be determined  Skilled Therapeutic Intervention  Session 1:  Pt in recliner upon arrival and agreeable to therapy, no c/o pain. Worked on functional mobility as detailed below in evaluation including bed mobility, transfers, w/c mobility, and ambulation. Pt moves very slowly and cautiously, but has moments of impulsivity when asked to turn or switch positions at which point he moves quickly and ataxic movements are more pronounced. However, pt is aware of deficits and recognizes that he needs assistance for all mobility at this time and waits until someone has a hand on him for balance before moving. PT instructed patient in PT Evaluation and initiated treatment intervention; see below for results. PT educated patient in [place], rehab potential, rehab goals, and discharge recommendations. Ended session in recliner w/ call bell within reach and all needs met.   Session 2:  Pt in recliner upon arrival and agreeable to therapy, no  c/o pain. Worked on endurance w/ functional mobility this session. Pt negotiated 4 steps w/ Min A and verbal cues for safety and technique. NuStep @ L2 for 10 min x2 w/ rest break secondary to fatigue. Emphasis on reciprocal and controlled movement pattern while on NuStep. Ended session supine w/ bed alarm on, call bell within reach and all needs met.   PT Evaluation Precautions/Restrictions Precautions Precautions: Fall Restrictions Weight Bearing Restrictions: No Pain Pain Assessment Pain Assessment: No/denies pain Home Living/Prior Functioning Home Living Available Help at Discharge: Family;Available 24 hours/day Type of Home: House Home Access: Stairs to enter [employer] of Steps: 3 in garage, 2 in front, no rails but son plans to install rail in garage steps Entrance Stairs-Rails: None Home Layout: One level Alternate Level Stairs-Number of Steps: 1 step down to enter into family room Alternate Level Stairs-Rails: None Bathroom Shower/Tub: Tub/shower unit;Door [employer] Toilet: Standard Bathroom Accessibility: Yes  Lives With: Spouse Prior Function Level of Independence: Independent with basic ADLs;Independent with gait  Able to Take Stairs?: Yes Driving: Yes Vocation: Retired Leisure: Hobbies-yes (Comment) Comments: drives; retired; 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; 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 Vision/Perception  Vision - Assessment Eye Alignment: Within Functional Limits Ocular Range of Motion: Within Functional Limits Alignment/Gaze Preference: Within Defined Limits Tracking/Visual Pursuits:  (Decreased smoothness of movement in all directions) Saccades: Overshoots Convergence: Impaired - to be further tested in functional context Perception Perception: Within Functional Limits Comments: Appears to be [facility], will continue to assess in future sessions Praxis Praxis: Intact  Cognition Overall Cognitive Status: Within Functional Limits for tasks  assessed Arousal/Alertness: Awake/alert Orientation Level: Oriented X4 Attention: Selective Selective Attention: Appears intact Memory: Appears intact Awareness: Appears intact Behaviors: Impulsive Safety/Judgment: Impaired Comments: Impulsive when asked to move, moves quickly and unsafely, but does not try to get up and move w/o someone holding onto him Sensation Sensation Light Touch: Appears Intact Proprioception: Appears Intact Coordination Gross Motor Movements are Fluid and Coordinated: Yes Fine Motor Movements are Fluid and Coordinated: No Finger Nose Finger Test: overshoots/undershoots Motor  Motor Motor: Hemiplegia;Ataxia Motor - Skilled Clinical Observations: Mild R hemiplegia, mild ataxia w/ all movements  Mobility Bed Mobility Bed Mobility: Rolling Right;Rolling Left;Supine to Sit;Sit to Supine Rolling Right: 5: Supervision  Rolling Right Details: Verbal cues for technique;Verbal cues for sequencing Rolling Left: 5: Supervision Rolling Left Details: Verbal cues for technique;Verbal cues for sequencing Supine to Sit: 5: Supervision Supine to Sit Details: Verbal cues for technique;Verbal cues for sequencing Sit to Supine: 5: Supervision Sit to Supine - Details: Verbal cues for technique;Verbal cues for sequencing Transfers Transfers: Yes Sit to Stand: 4: Min guard Stand to Sit: 4: Min guard Stand Pivot Transfers: 4: Min Psychologist, occupational Details: Verbal cues for technique;Verbal cues for sequencing;Verbal cues for safe use of DME/AE Locomotion  Ambulation Ambulation: Yes Ambulation/Gait Assistance: 4: Min assist Ambulation Distance (Feet): 125 Feet Assistive device: Rolling walker Ambulation/Gait Assistance Details: Verbal cues for technique;Verbal cues for sequencing;Tactile cues for posture;Verbal cues for safe use of DME/AE Gait Gait: Yes Gait Pattern: Impaired Gait Pattern: Shuffle;Ataxic;Trunk flexed Gait velocity: decreased Stairs /  Additional Locomotion Stairs: Yes Stairs Assistance: 4: Min assist Stairs Assistance Details: Verbal cues for technique;Verbal cues for sequencing;Verbal cues for precautions/safety Stair Management Technique: Two rails Number of Stairs: 4 Wheelchair Mobility Wheelchair Mobility: Yes Wheelchair Assistance: 5: Investment banker, operational Details: Verbal cues for technique;Verbal cues for Astronomer: Both lower extermities Wheelchair Parts Management: Needs assistance Distance: 150'  Trunk/Postural Assessment  Cervical Assessment Cervical Assessment: Within Functional Limits Thoracic Assessment Thoracic Assessment: Within Functional Limits Lumbar Assessment Lumbar Assessment: Within Functional Limits Postural Control Postural Control: Deficits on evaluation (absent/delayed)  Balance Balance Balance Assessed: Yes Static Sitting Balance Static Sitting - Balance Support: No upper extremity supported Static Sitting - Level of Assistance: 5: Stand by assistance Dynamic Sitting Balance Dynamic Sitting - Balance Support: No upper extremity supported Dynamic Sitting - Level of Assistance: 5: Stand by assistance Sitting balance - Comments: trunkal ataxia Static Standing Balance Static Standing - Balance Support: Right upper extremity supported Static Standing - Level of Assistance: 4: Min assist Dynamic Standing Balance Dynamic Standing - Balance Support: Bilateral upper extremity supported Dynamic Standing - Level of Assistance: 4: Min assist Extremity Assessment  RUE Assessment RUE Assessment: Exceptions to [facility] (generalized weakness, decreased coordination) LUE Assessment LUE Assessment: Within Functional Limits RLE Assessment RLE Assessment: Exceptions to [facility] (4 to 5/5 globally) LLE Assessment LLE Assessment: Within Functional Limits   See Function Navigator for Current Functional Status.   Refer to Care Plan for Long Term  Goals  Recommendations for other services: Therapeutic Recreation  Outing/community reintegration  Discharge Criteria: Patient will be discharged from PT if patient refuses treatment 3 consecutive times without medical reason, if treatment goals not met, if there is a change in medical status, if patient makes no progress towards goals or if patient is discharged from hospital.  The above assessment, treatment plan, treatment alternatives and goals were discussed and mutually agreed upon: by patient and by family  Kavya Haag K Arnette 07/29/2017, 1:43 PM

## 2017-07-30 ENCOUNTER — Encounter (HOSPITAL_COMMUNITY): Payer: Self-pay | Admitting: Cardiology

## 2017-07-30 NOTE — Progress Notes (Signed)
Patient ID: Earl [name], male   DOB: 1941-06-25, 76 y.o.   MRN: [id number]   07/30/17.  Earl [name] is a 76 y.o. male  Admitted to rehabilitation yesterday following a superior right cerebellar stroke.  Patient has been on aspirin for stroke prophylaxis. Patient has history of hypertension, possible PAF as well as type 2 diabetes.   Past Medical History:  Diagnosis Date  . Atrial fibrillation ([place])   . BPH (benign prostatic hyperplasia)   . Diabetes mellitus ([place])   . Hyperlipidemia   . Hypertension   . Other testicular hypofunction   . Type II or unspecified type diabetes mellitus without mention of complication, not stated as uncontrolled      Subjective: No new complaints. No new problems. Constipation improved.  Had BM yesterday.  Objective: Vital signs in last 24 hours: Temp:  [98.5 F (36.9 C)-99.8 F (37.7 C)] 99.8 F (37.7 C) (09/09 0405) Pulse Rate:  [98-104] 104 (09/09 0405) Resp:  [18] 18 (09/09 0405) BP: (132-144)/(83-90) 132/90 (09/09 0405) SpO2:  [95 %-99 %] 95 % (09/09 0405) Weight change:  Last BM Date: 07/28/17  Intake/Output from previous day: 09/08 0701 - 09/09 0700 In: 680 [P.O.:680] Out: 200 [Urine:200] Last cbgs: CBG (last 3)   Recent Labs  07/28/17 1124 07/28/17 1313 07/28/17 1617  GLUCAP 201* 200* 349*   BP Readings from Last 3 Encounters:  07/30/17 132/90  07/28/17 (!) 154/88  04/14/17 124/72    Physical Exam General: No apparent distress   HEENT: not dry Lungs: Normal effort. Lungs clear to auscultation, no crackles or wheezes. Cardiovascular: Regular rate and rhythm, no edema Abdomen: S/NT/ND; BS(+) Musculoskeletal:  unchanged Neurological: No new neurological deficits with R sided dyspraxia Wounds: N/A    Skin: clear   Mental state: Alert, oriented, cooperative    Lab Results: BMET    Component Value Date/Time   NA 137 07/29/2017 0605   NA 141 11/01/2016 0826   K 3.9 07/29/2017 0605   K 4.1 11/01/2016  0826   CL 104 07/29/2017 0605   CL 101 10/23/2012 0802   CO2 23 07/29/2017 0605   CO2 23 11/01/2016 0826   GLUCOSE 254 (H) 07/29/2017 0605   GLUCOSE 98 11/01/2016 0826   GLUCOSE 149 (H) 10/23/2012 0802   BUN 11 07/29/2017 0605   BUN 10.2 11/01/2016 0826   CREATININE 1.23 07/29/2017 0605   CREATININE 1.17 04/14/2017 1151   CREATININE 1.0 11/01/2016 0826   CALCIUM 8.7 (L) 07/29/2017 0605   CALCIUM 9.0 11/01/2016 0826   GFRNONAA 55 (L) 07/29/2017 0605   GFRNONAA 61 04/14/2017 1151   GFRAA >60 07/29/2017 0605   GFRAA 70 04/14/2017 1151   CBC    Component Value Date/Time   WBC 7.3 07/29/2017 0605   RBC 5.03 07/29/2017 0605   HGB 14.4 07/29/2017 0605   HGB 13.6 11/01/2016 0824   HCT 41.9 07/29/2017 0605   HCT 41.8 11/01/2016 0824   PLT 174 07/29/2017 0605   PLT 155 11/01/2016 0824   MCV 83.3 07/29/2017 0605   MCV 87.4 11/01/2016 0824   MCH 28.6 07/29/2017 0605   MCHC 34.4 07/29/2017 0605   RDW 13.3 07/29/2017 0605   RDW 13.8 11/01/2016 0824   LYMPHSABS 2.5 07/29/2017 0605   LYMPHSABS 2.0 11/01/2016 0824   MONOABS 0.9 07/29/2017 0605   MONOABS 0.5 11/01/2016 0824   EOSABS 0.1 07/29/2017 0605   EOSABS 0.2 11/01/2016 0824   BASOSABS 0.0 07/29/2017 0605   BASOSABS 0.0 11/01/2016 4098  Medications: I have reviewed the patient's current medications.  Assessment/Plan:  Functional deficits secondary to R cerebellar infarct DM.  BS still elevated with continue SSI coverage.  Consider basal insulin HTN- stable. No change present therapy but BP still running a bit high     Length of stay, days: 2  Earl [name] , MD 07/30/2017, 8:57 AM

## 2017-07-31 ENCOUNTER — Telehealth: Payer: Self-pay | Admitting: *Deleted

## 2017-07-31 ENCOUNTER — Inpatient Hospital Stay (HOSPITAL_COMMUNITY): Payer: Medicare Other | Admitting: Physical Therapy

## 2017-07-31 ENCOUNTER — Encounter (HOSPITAL_COMMUNITY): Payer: Self-pay | Admitting: Internal Medicine

## 2017-07-31 ENCOUNTER — Inpatient Hospital Stay (HOSPITAL_COMMUNITY): Payer: Medicare Other

## 2017-07-31 ENCOUNTER — Inpatient Hospital Stay (HOSPITAL_COMMUNITY): Payer: Medicare Other | Admitting: Occupational Therapy

## 2017-07-31 DIAGNOSIS — E1159 Type 2 diabetes mellitus with other circulatory complications: Secondary | ICD-10-CM

## 2017-07-31 DIAGNOSIS — E8809 Other disorders of plasma-protein metabolism, not elsewhere classified: Secondary | ICD-10-CM | POA: Insufficient documentation

## 2017-07-31 DIAGNOSIS — I10 Essential (primary) hypertension: Secondary | ICD-10-CM

## 2017-07-31 DIAGNOSIS — E46 Unspecified protein-calorie malnutrition: Secondary | ICD-10-CM | POA: Insufficient documentation

## 2017-07-31 DIAGNOSIS — I69393 Ataxia following cerebral infarction: Secondary | ICD-10-CM

## 2017-07-31 LAB — GLUCOSE, CAPILLARY
GLUCOSE-CAPILLARY: 237 mg/dL — AB (ref 65–99)
GLUCOSE-CAPILLARY: 269 mg/dL — AB (ref 65–99)
GLUCOSE-CAPILLARY: 219 mg/dL — AB (ref 65–99)
GLUCOSE-CAPILLARY: 285 mg/dL — AB (ref 65–99)
GLUCOSE-CAPILLARY: 227 mg/dL — AB (ref 65–99)
GLUCOSE-CAPILLARY: 262 mg/dL — AB (ref 65–99)
GLUCOSE-CAPILLARY: 328 mg/dL — AB (ref 65–99)
GLUCOSE-CAPILLARY: 250 mg/dL — AB (ref 65–99)
Glucose-Capillary: 203 mg/dL — ABNORMAL HIGH (ref 65–99)
Glucose-Capillary: 89 mg/dL (ref 65–99)
Glucose-Capillary: 218 mg/dL — ABNORMAL HIGH (ref 65–99)
Glucose-Capillary: 289 mg/dL — ABNORMAL HIGH (ref 65–99)
Glucose-Capillary: 229 mg/dL — ABNORMAL HIGH (ref 65–99)
Glucose-Capillary: 119 mg/dL — ABNORMAL HIGH (ref 65–99)

## 2017-07-31 MED ORDER — PRO-STAT SUGAR FREE PO LIQD
30.0000 mL | Freq: Two times a day (BID) | ORAL | Status: DC
  Administered 2017-07-31 – 2017-08-08 (×17): 30 mL via ORAL
  Filled 2017-07-31 (×17): qty 30

## 2017-07-31 NOTE — Progress Notes (Signed)
Occupational Therapy Session Note  Patient Details  Name: Terran Klinke MRN: [id number] Date of Birth: [birth year]  Today's Date: 07/31/2017 OT Individual Time: 1130-1200 OT Individual Time Calculation (min): 30 min    Short Term Goals: Week 1:  OT Short Term Goal 1 (Week 1): STG=LTG 2/2 ELOS  Skilled Therapeutic Interventions/Progress Updates:    Treatment focus on standing balance, functional mobility, and functional use of affected RUE. Pt with no c/o of pain. Therapist challenged pt to ambulate from bedroom to therapy room. Pt able to ambulate over 100' from room to therapy room with use of RW and min guard with no rest breaks, therapist provided cues for increased gait speed. Pt completed therapeutic activity to promote improvement in standing balance with and without UE support on RW while performing simulated daily tasks. Therapist had pt cross midline to reach multiple planes using RUE while standing to promote functional standing balance while performing self-care and IADLs. Pt able to complete activity x2 with use of RW and x2 without use of RW. Therapist provided min guard throughout activity for pt safety. Pt ambulated back to room with RW with no rest breaks with min guard. Pt left in recliner with all needs met.  Therapy Documentation Precautions:  Precautions Precautions: Fall Precaution Comments: dizziness Restrictions Weight Bearing Restrictions: No General:   Vital Signs:   Pain: Pain Assessment Pain Assessment: No/denies pain  See Function Navigator for Current Functional Status.   Therapy/Group: Individual Therapy  Hoyt Koch 07/31/2017, 12:21 PM

## 2017-07-31 NOTE — Progress Notes (Addendum)
Physical Therapy Note  Patient Details  Name: Earl [name] MRN: [id number] Date of Birth: 1941-06-27 Today's Date: 07/31/2017  tx 1:  1030-1100, 30 min individul tx   Pain: none per pt Seated neuro re-ed: R/L 10 x 1 each- long arc quads, calf raises, marching, bil hip adduction.  Gait training with RW on level ground x 20 x 2 , focusing on upright trunk, forward gaze, and correct sequencing, and up/down (4) 6" high steps, L rails, step-to method, all with min assist.  PT instructed pt in head /hips releationship for sit>< stand. Seated 15 x 1 bil hip abduction with bright green Theraband.  Pt left resting in w/c with quick release belt applied and all needs within reach.  Tx 2: 1350-1420, 30 min individual tx Pain, none per pt  Seated neuro re-ed for bil hip ER stretch, trunk flex/ext with midline control, x 10.  Standing neuro re-ed via demo, VCS for heel raises bil, R/L alternaing ankle DF with RW and min/mod assist for balance.  Seated rest before gait traianing in congested area requiring sidestepping R/L with RW, and gait on level tile x 50' with multiple turns, cueing for upright trunk, step lengths, sequencing.  Pt fatigued, assisted to bed.  Pt left resting in bed with alarm set and and all needs within reach.  See function navigator for current status.  Earl [name] 07/31/2017, 10:23 AM

## 2017-07-31 NOTE — Progress Notes (Signed)
Social Work  Social Work Assessment and Plan  Patient Details  Name: Earl [name] MRN: [id number] Date of Birth: 11/14/1941  Today's Date: 07/31/2017  Problem List:  Patient Active Problem List   Diagnosis Date Noted  . Hypoalbuminemia due to protein-calorie malnutrition ([place])   . Diabetes mellitus ([place])   . Benign essential HTN   . Ataxia, post-stroke   . Cerebellar stroke ([place])   . Dizziness 07/25/2017  . Hypothyroidism 09/13/2016  . BMI 26.53,adult 08/28/2015  . Medicare annual wellness visit, subsequent 08/28/2015  . 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 Vitamin D deficiency 04/29/2014  . Medication management 01/03/2014  . 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 BPH (benign prostatic hyperplasia)   . Testosterone deficiency   . 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 Anemia 11/07/2013  . Hypertension 11/07/2013  . Hyperlipidemia 11/07/2013  . Paroxysmal atrial fibrillation ([place]) 09/18/2012   Past Medical History:  Past Medical History:  Diagnosis Date  . Atrial fibrillation ([place])   . BPH (benign prostatic hyperplasia)   . Diabetes mellitus ([place])   . Hyperlipidemia   . Hypertension   . Other testicular hypofunction   . Type II or unspecified type diabetes mellitus without mention of complication, not stated as uncontrolled    Past Surgical History:  Past Surgical History:  Procedure Laterality Date  . [place] and 2004  . LOOP RECORDER INSERTION N/A 07/28/2017   Procedure: LOOP RECORDER INSERTION;  Surgeon: Deboraha Sprang, MD;  Location: [facility];  Service: Cardiovascular;  Laterality: N/A;  . TEE WITHOUT CARDIOVERSION N/A 07/28/2017   Procedure: TRANSESOPHAGEAL ECHOCARDIOGRAM (TEE);  Surgeon: Sueanne Margarita, MD;  Location: [facility] ENDOSCOPY;  Service: Cardiovascular;  Laterality: N/A;   Social History:  reports that he has never smoked. He has never used smokeless tobacco. He reports  that he does not drink alcohol or use drugs.  Family / Support Systems Marital Status: Married Patient Roles: Spouse, Parent, Volunteer Spouse/Significant Other: wife, Earl [name] @ [phone] Children: son, Earl [name], living in [place] and daughter, Earl [name], in [place] Anticipated Caregiver: wife Ability/Limitations of Caregiver: wife completed chemo and radiation 2 months ago Caregiver Availability: 24/7 Family Dynamics: Pt describes all family as very supportive.  Feeling confident that wife will be able to provide any needed support.  Notes she is "doing great" after completing CA tx.  Social History Preferred language: English Religion: Non-Denominational Cultural Background: Na Education: college Read: Yes Write: Yes Employment Status: Retired Date Retired/Disabled/Unemployed: 76 yrs Freight forwarder Issues: None Guardian/Conservator: None - per MD, pt is capable of making decisions on his own behalf.   Abuse/Neglect Physical Abuse: Denies Verbal Abuse: Denies Sexual Abuse: Denies Exploitation of patient/patient's resources: Denies Self-Neglect: Denies  Emotional Status Pt's affect, behavior adn adjustment status: Pt very pleasant and completes assessment interview without any difficulty.  He is very optimistic about a "full recovery" and eager to return to his community activities.  He denies any significant emotional distress - will monitor. Recent Psychosocial Issues: None Pyschiatric History: None Substance Abuse History: None  Patient / Family Perceptions, Expectations & Goals Pt/Family understanding of illness & functional limitations: Pt and family with basic understanding of his stroke and current functional deficits/ need for CIR. Premorbid pt/family roles/activities: Pt completely independent and active both at home and in the community. Anticipated changes in roles/activities/participation: Little change anticipated if able to reach supervision/  mod ind goals. Pt/family  expectations/goals: "I want to know when I'll be able to get back on the roof.Marland KitchenMarland KitchenMarland KitchenI have cleaning to do..."  [employer]: None Premorbid Home Care/DME Agencies: None Transportation available at discharge: Yes  Discharge Planning Living Arrangements: Spouse/significant other Support Systems: Spouse/significant other Type of Residence: Private residence Insurance Resources: [employer] (([place] Medicare)) Financial Resources: Radio broadcast assistant Screen Referred: No Living Expenses: Own Money Management: Patient Does the patient have any problems obtaining your medications?: No Home Management: pt and wife Patient/Family Preliminary Plans: Pt to return home with wife who can provide supervision.  Likely to go to OP center for txs. Social Work Anticipated Follow Up Needs: HH/OP Expected length of stay: 7-10 days  Clinical Impression Pleasant, elderly gentleman here following a CVA and no h/o prior CVAs.  Making excellent gains and goals anticipated for mod ind - supervision overall.  He is very motivated for CIR and reports his wife is able to provide supervision.  He does note that she just completed chemo for CA ~ 2 months ago "but is doing fine."  Denies any significant emotional distress.  Will follow for support and d/c planning needs.  Korrie Hofbauer 07/31/2017, 1:59 PM

## 2017-07-31 NOTE — Progress Notes (Signed)
[place] PHYSICAL MEDICINE & REHABILITATION     PROGRESS NOTE  Subjective/Complaints:  Pt seen laying in bed this AM.  He slept well overnight.  He states he had a good weekend, except for some constipation.   ROS: Denies CP, SOB, N/V/D.  Objective: Vital Signs: Blood pressure 136/83, pulse 89, temperature 98.4 F (36.9 C), temperature source Oral, resp. rate 18, SpO2 100 %. No results found.  Recent Labs  07/29/17 0605  WBC 7.3  HGB 14.4  HCT 41.9  PLT 174    Recent Labs  07/29/17 0605  NA 137  K 3.9  CL 104  GLUCOSE 254*  BUN 11  CREATININE 1.23  CALCIUM 8.7*   CBG (last 3)   Recent Labs  07/30/17 1612 07/30/17 2114 07/31/17 0626  GLUCAP 289* 229* 262*    Wt Readings from Last 3 Encounters:  07/26/17 82.1 kg (181 lb 1.6 oz)  04/14/17 87.2 kg (192 lb 3.2 oz)  12/23/16 88.5 kg (195 lb)    Physical Exam:  BP 136/83 (BP Location: Right Arm)   Pulse 89   Temp 98.4 F (36.9 C) (Oral)   Resp 18   SpO2 100%  Constitutional: He appears well-developedand well-nourished. No distress.  HENT: Normocephalicand atraumatic.  Eyes: EOMare normal. No discharge.  Cardiovascular: Normal rateand regular rhythm. No JVD. Respiratory: Effort normaland breath sounds normal.  GI: Bowel sounds are normal. He exhibits no distension.  Musculoskeletal: He exhibits no edemaor tenderness.  Neurological: He is alertand oriented.  Speech clear.  Follow commands without difficulty.  Mild ataxia RUE  Motor: RUE/RLE grossly 4+/5 prox to distal.  LUE/LLE 5/5.  No sensory deficits.  Skin: Skin is warmand dry. He is not diaphoretic.  Psychiatric: He has a normal mood and affect. His behavior is normal. Judgmentand thought contentnormal.   Assessment/Plan: 1. 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 Physiatrist is providing close team supervision  and 24 hour management of active medical problems listed below. Physiatrist and rehab team continue to assess barriers to discharge/monitor patient progress toward functional and medical goals.  Function:  Bathing Bathing position   Position: Shower  Bathing parts Body parts bathed by patient: Right arm, Left lower leg, Left arm, Chest, Front perineal area, Abdomen, Buttocks, Right upper leg, Left upper leg, Right lower leg Body parts bathed by helper: Back  Bathing assist Assist Level: Touching or steadying assistance(Pt > 75%)      Upper Body Dressing/Undressing Upper body dressing                    Upper body assist Assist Level: Supervision or verbal cues      Lower Body Dressing/Undressing Lower body dressing   What is the patient wearing?: Pants, Underwear, Non-skid slipper socks Underwear - Performed by patient: Thread/unthread right underwear leg, Thread/unthread left underwear leg, Pull underwear up/down   Pants- Performed by patient: Thread/unthread left pants leg, Thread/unthread right pants leg, Pull pants up/down   Non-skid slipper socks- Performed by patient: Don/doff right sock, Don/doff left sock                    Lower body assist Assist for lower body dressing: Touching or steadying assistance (Pt > 75%)      Toileting Toileting Toileting activity did not occur: No continent bowel/bladder event Toileting steps completed by patient: Adjust clothing prior to toileting, Performs perineal hygiene,  Adjust clothing after toileting Toileting steps completed by helper: Adjust clothing prior to toileting, Performs perineal hygiene, Adjust clothing after toileting (per Lawson Fiscal, NT report) Toileting Assistive Devices: Grab bar or rail  Toileting assist Assist level: No help/no cues   Transfers Chair/bed transfer   Chair/bed transfer method: Stand pivot Chair/bed transfer assist level: Touching or steadying assistance (Pt > 75%) Chair/bed  transfer assistive device: Armrests, Medical sales representative     Max distance: 125' Assist level: Touching or steadying assistance (Pt > 75%)   Wheelchair   Type: Manual Max wheelchair distance: 150' Assist Level: Supervision or verbal cues  Cognition Comprehension Comprehension assist level: Follows basic conversation/direction with no assist  Expression Expression assist level: Set-up assist with assistive device  Social Interaction Social Interaction assist level: Interacts appropriately with others - No medications needed.  Problem Solving Problem solving assist level: Solves basic problems with no assist  Memory Memory assist level: More than reasonable amount of time    Medical Problem List and Plan: 1. Functional and mobility deficitssecondary to right cerebellar infarct  Cont CIR  Notes reviewed, images reviewed 2. DVT Prophylaxis/Anticoagulation: Pharmaceutical: Lovenox 3. Neuropaty/Pain Management: Continue gabapentin.  4. Mood: patient very motivated. LCSW to follow for evaluation and support.  5. Neuropsych: This patient iscapable of making decisions on hisown behalf. 6. Skin/Wound Care: routine pressure relief measures 7. Fluids/Electrolytes/Nutrition: Monitor I/Os 8. HTN: Monitor BP bid  Ziac, Accupril and Metoprolol PTA.   Controlled at present 9. T2DM: Hgb A1c- 6.7. Monitor BS ac/hs. Po intake good--resumed glucotrol. Continue to use SSI for elevated BS.   Will resume metformin 500 daily tomorrow 10. Hypothyroid: On supplement 11. BPH: On proscar.  12. Hypoalbuminemia  Supplement initiated 9/110  LOS (Days) 3 A FACE TO FACE EVALUATION WAS PERFORMED  Kitty Cadavid Lorie Phenix 07/31/2017 11:08 AM

## 2017-07-31 NOTE — IPOC Note (Signed)
Overall Plan of Care [facility]) Patient Details Name: Earl [name] MRN: [id number] DOB: [birth year]  Admitting Diagnosis: CVA  Hospital Problems: Active Problems:   Cerebellar stroke ([place])   Hypoalbuminemia due to protein-calorie malnutrition (HCC)   Diabetes mellitus ([place])   Benign essential HTN   Ataxia, post-stroke     Functional Problem List: Nursing Endurance, Bladder, Bowel, Medication Management, Pain, Perception, Safety, Sensory, Motor, Skin Integrity  PT Balance, Behavior, Endurance, Motor, Safety  OT    SLP    TR         Basic ADL's: OT Bathing, Grooming, Toileting, Dressing     Advanced  ADL's: OT       Transfers: PT Bed Mobility, Bed to Chair, Car, [employer], Futures trader, Metallurgist: PT Emergency planning/management officer, Ambulation, Stairs     Additional Impairments: OT Fuctional Use of Upper Extremity  SLP        TR      Anticipated Outcomes Item Anticipated Outcome  Self Feeding MOD I  Swallowing      Basic self-care  MOD I  Toileting  MOD I toilet; bathing wiht S   Bathroom Transfers MOD I toileting; Bathing wiht S  Bowel/Bladder  mod indip  Transfers  supervision  Locomotion  supervision  Communication     Cognition     Pain  less<2  Safety/Judgment  min assist   Therapy Plan: PT Intensity: Minimum of 1-2 x/day ,45 to 90 minutes PT Frequency: 5 out of 7 days PT Duration Estimated Length of Stay: 7-10 days OT Intensity: Minimum of 1-2 x/day, 45 to 90 minutes OT Frequency: 5 out of 7 days OT Duration/Estimated Length of Stay: 7-10      Team Interventions: Nursing Interventions Patient/Family Education, Disease Management/Prevention, Medication Management, Skin Care/Wound Management, Cognitive Remediation/Compensation, Discharge Planning, Psychosocial Support  PT interventions Visual/perceptual remediation/compensation, Stair training, Pain management, Disease management/prevention, Ambulation/gait training,  Training and development officer, DME/adaptive equipment instruction, Therapeutic Activities, Patient/family education, Wheelchair propulsion/positioning, Psychosocial support, Therapeutic Exercise, Functional electrical stimulation, Cognitive remediation/compensation, Community reintegration, Functional mobility training, Skin care/wound management, UE/LE Strength taining/ROM, UE/LE Coordination activities, Splinting/orthotics, Neuromuscular re-education, Discharge planning  OT Interventions Training and development officer, Cognitive remediation/compensation, Community reintegration, Disease mangement/prevention, Discharge planning, DME/adaptive equipment instruction, Functional mobility training, Neuromuscular re-education, Pain management, Patient/family education, Psychosocial support, Self Care/advanced ADL retraining, Therapeutic Activities, Therapeutic Exercise, UE/LE Strength taining/ROM, UE/LE Coordination activities, Visual/perceptual remediation/compensation  SLP Interventions    TR Interventions    SW/CM Interventions Discharge Planning, Psychosocial Support, Patient/Family Education   Barriers to Discharge MD  Medical stability  Nursing      PT Inaccessible home environment, Decreased caregiver support, Home environment access/layout, Lack of/limited family support wife may be unable to physically assist, 1 step to enter into family room  OT      SLP      SW       Team Discharge Planning: Destination: PT-Home ,OT- Home , SLP-  Projected Follow-up: PT-Outpatient PT (OPPT if wife is able to drive ), OT-  Home health OT, SLP-  Projected Equipment Needs: PT-To be determined, OT- To be determined, SLP-  Equipment Details: PT- , OT-  Patient/family involved in discharge planning: PT- Patient, Family member/caregiver,  OT-Patient, SLP-   MD ELOS: 6-9 days. Medical Rehab Prognosis:  Good Assessment: 76 y.o. male with history of  HTN, questionable history PAF, T2DM; who was admitted on 07/25/17  with dizziness, nausea and right sided weakness. History taken from chart review and patient.  CT  head reviewed, showing acute superior  right cerebellar infarct. CTA head/neck done revealing right AICA late acute/early subacute infarct, no vascular abnormality. Patient started on ASA for stroke prophylaxis and neurology followed. Patient with resulting deficits with mobility and ability to complete ADLs due to ataxia.  Will set goals for Mod I with PT/OT.   See Team Conference Notes for weekly updates to the plan of care

## 2017-07-31 NOTE — Progress Notes (Signed)
Patient information reviewed and entered into eRehab System by Becky Shalanda Brogden, covering PPS coordinator. Information including medical coding and functional independence measure will be reviewed and updated through discharge.  Per nursing, patient was given "Data Collection Information Summary for Patients in Inpatient Rehabilitation Facilities with attached Privacy Act Statement Health Care Records" upon admission.     

## 2017-07-31 NOTE — Progress Notes (Signed)
Occupational Therapy Session Note  Patient Details  Name: Earl [name] MRN: [id number] Date of Birth: 1941-10-02  Today's Date: 07/31/2017 OT Individual Time: 7893-8101 OT Individual Time Calculation (min): 60 min    Short Term Goals: Week 1:  OT Short Term Goal 1 (Week 1): STG=LTG 2/2 ELOS  Skilled Therapeutic Interventions/Progress Updates:    Pt seen to focus on self-care/ADLs, functional mobility/transfers, and standing balance. Pt with no c/o pain. Therapist discussed usual daily routine for future sessions with pt preference for afternoon shower. Pt supine>EOB>stand with supervision. Pt ambulated ~10' from bed to toilet with RW with min guard for safety. Able to complete toilet transfer and hygiene with min. A from therapist for steadying due to [place]. Pt completed shower with use of shower chair and hand held shower with supervision/set-up. Therapist provided min. A when completing peri hygiene while standing due to decreased standing balance. Pt required min VC for w/c brakes for safety. Therapist challenged pt with dynavision activity to focus on standing balance and simulate functional tasks requiring reaching/crossing midline with use of dominant RUE. Pt able to complete task x2 tolerating 2 minutes each with use of RW and supervision for safety. Pt able to complete w/c > recliner with mod hand held A from therapist to ambulate ~2 feet to chair without use of RW. Pt left with all needs in reach.  Therapy Documentation Precautions:  Precautions Precautions: Fall Precaution Comments: dizziness Restrictions Weight Bearing Restrictions: No    See Function Navigator for Current Functional Status.   Therapy/Group: Individual Therapy  Hoyt Koch 07/31/2017, 11:14 AM

## 2017-07-31 NOTE — Telephone Encounter (Signed)
Got that patient was discharged form hosp but was sent to rehab facility & pt will call back once released from the rehab facility.  -sb 07/31/17

## 2017-07-31 NOTE — Progress Notes (Signed)
Physical Therapy Note  Patient Details  Name: Amaree Leeper MRN: [id number] Date of Birth: 01/29/1941 Today's Date: 07/31/2017    Time: 1430-1512 42 minutes  1:1 No c/o pain.  Pt performed gait 150' x 2 with RW with min A with cuing for increased cadence with pt able to maintain 4-5 steps at a time before requiring cuing again.  kinetron in standing 90 seconds x 3 with focus on LE strength and balance.  Standing in modified plantigrade alternating LE kicks backward and sideways 2 x 10.  Sit to stands without UE support with mod A for stand to sit, min A sit to stand.  Pt with good motivation to improve.   Onalee Steinbach 07/31/2017, 3:12 PM

## 2017-07-31 NOTE — Care Management (Signed)
Inpatient Rehabilitation Center Individual Statement of Services  Patient Name:  Earl [name]  Date:  07/31/2017  Welcome to the [facility].  Our goal is to provide you with an individualized program based on your diagnosis and situation, designed to meet your specific needs.  With this comprehensive rehabilitation program, you will be expected to participate in at least 3 hours of rehabilitation therapies Monday-Friday, with modified therapy programming on the weekends.  Your rehabilitation program will include the following services:  Physical Therapy (PT), Occupational Therapy (OT), Speech Therapy (ST), 24 hour per day rehabilitation nursing, Therapeutic Recreaction (TR), Case Management (Social Worker), Rehabilitation Medicine, Nutrition Services and Pharmacy Services  Weekly team conferences will be held on Wednesdays to discuss your progress.  Your Social Worker will talk with you frequently to get your input and to update you on team discussions.  Team conferences with you and your family in attendance may also be held.  Expected length of stay: 7-10 days  Overall anticipated outcome: supervision  Depending on your progress and recovery, your program may change. Your Social Worker will coordinate services and will keep you informed of any changes. Your Social Worker's name and contact numbers are listed  below.  The following services may also be recommended but are not provided by the [facility] will be made to provide these services after discharge if needed.  Arrangements include referral to agencies that provide these services.  Your insurance has been verified to be:  [facility] Medicare Your primary doctor is:  Dr. Melford Aase  Pertinent information will be shared with your doctor and your insurance company.  Social Worker:   [place], [place] or ([phone]   Information discussed with and copy given to patient by: Lennart Pall, 07/31/2017, 2:02 PM

## 2017-08-01 ENCOUNTER — Inpatient Hospital Stay (HOSPITAL_COMMUNITY): Payer: Medicare Other | Admitting: Physical Therapy

## 2017-08-01 ENCOUNTER — Inpatient Hospital Stay (HOSPITAL_COMMUNITY): Payer: Medicare Other | Admitting: Occupational Therapy

## 2017-08-01 ENCOUNTER — Telehealth: Payer: Self-pay | Admitting: *Deleted

## 2017-08-01 LAB — GLUCOSE, CAPILLARY
GLUCOSE-CAPILLARY: 433 mg/dL — AB (ref 65–99)
GLUCOSE-CAPILLARY: 421 mg/dL — AB (ref 65–99)
GLUCOSE-CAPILLARY: 323 mg/dL — AB (ref 65–99)
GLUCOSE-CAPILLARY: 142 mg/dL — AB (ref 65–99)
Glucose-Capillary: 210 mg/dL — ABNORMAL HIGH (ref 65–99)
Glucose-Capillary: 116 mg/dL — ABNORMAL HIGH (ref 65–99)

## 2017-08-01 MED ORDER — METFORMIN HCL 500 MG PO TABS
500.0000 mg | ORAL_TABLET | Freq: Three times a day (TID) | ORAL | Status: DC
  Administered 2017-08-01 – 2017-08-08 (×26): 500 mg via ORAL
  Filled 2017-08-01 (×27): qty 1

## 2017-08-01 MED ORDER — APIXABAN 5 MG PO TABS
5.0000 mg | ORAL_TABLET | Freq: Two times a day (BID) | ORAL | Status: DC
  Administered 2017-08-01 – 2017-08-08 (×14): 5 mg via ORAL
  Filled 2017-08-01 (×14): qty 1

## 2017-08-01 MED ORDER — METFORMIN HCL 500 MG PO TABS
500.0000 mg | ORAL_TABLET | Freq: Every day | ORAL | Status: DC
  Administered 2017-08-01: 500 mg via ORAL

## 2017-08-01 MED ORDER — LISINOPRIL 5 MG PO TABS
5.0000 mg | ORAL_TABLET | Freq: Every day | ORAL | Status: DC
Start: 2017-08-01 — End: 2017-08-08
  Administered 2017-08-01 – 2017-08-08 (×8): 5 mg via ORAL
  Filled 2017-08-01 (×7): qty 1

## 2017-08-01 MED ORDER — GLIPIZIDE 5 MG PO TABS
5.0000 mg | ORAL_TABLET | Freq: Three times a day (TID) | ORAL | Status: DC
  Administered 2017-08-01 – 2017-08-06 (×16): 5 mg via ORAL
  Filled 2017-08-01 (×15): qty 1

## 2017-08-01 NOTE — Progress Notes (Signed)
Patient's home diabetes regimen confirmed--he took metformin 500 mg qid and glipizide tid--will resume and monitor. Advise patient to drink plenty of water for hydration. Will check lytes in am.

## 2017-08-01 NOTE — Evaluation (Signed)
Recreational Therapy Assessment and Plan  Patient Details  Name: Earl [name] MRN: [id number] Date of Birth: [birth year] Today's Date: 08/01/2017  Rehab Potential: Good ELOS: 7 days   Assessment    Problem List:      Patient Active Problem List   Diagnosis Date Noted  . Cerebellar stroke ([place])   . Dizziness 07/25/2017  . Hypothyroidism 09/13/2016  . BMI 26.53,adult 08/28/2015  . Medicare annual wellness visit, subsequent 08/28/2015  . 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 Vitamin D deficiency 04/29/2014  . Medication management 01/03/2014  . 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 BPH (benign prostatic hyperplasia)   . Testosterone deficiency   . 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 Anemia 11/07/2013  . Hypertension 11/07/2013  . Hyperlipidemia 11/07/2013  . Paroxysmal atrial fibrillation ([place]) 09/18/2012    Past Medical History:      Past Medical History:  Diagnosis Date  . Atrial fibrillation ([place])   . BPH (benign prostatic hyperplasia)   . Diabetes mellitus ([place])   . Hyperlipidemia   . Hypertension   . Other testicular hypofunction   . Type II or unspecified type diabetes mellitus without mention of complication, not stated as uncontrolled    Past Surgical History:       Past Surgical History:  Procedure Laterality Date  . [place] and 2004    Assessment & Plan Clinical Impression: Patient is a 76 y.o.malewith history of HTN, questionable history PAF, T2DM; who was admitted on 07/25/17 with dizziness, nausea and right sided weakness. History taken from chart review and patient. CT head reviewed, showing acute superior right cerebellar infarct. CTA head/neck done revealing right AICA late acute/early subacute infarct, no vascular abnormality. Patient started on ASA for stroke prophylaxis and TEE ordered for work up of embolic stroke. Therapy ongoing with  limitations in mobility and self care tasks. Patient transferred to CIR on 07/28/2017 .   Pt presents with decreased activity tolerance, decreased functional mobility, decreased balance, decreased coordination Limiting pt's independence with leisure/community pursuits.  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) Expression Interests: Music (Comment) Other Leisure Interests: Television;Reading;Computer;Housework Leisure Participation Style: With Family/Friends Awareness of Community Resources: Excellent Psychosocial / Spiritual Social interaction - Mood/Behavior: Cooperative Strengths/Weaknesses Patient Strengths/Abilities: Willingness to participate;Active premorbidly Patient weaknesses: Physical limitations TR Patient demonstrates impairments in the following area(s): Endurance;Motor;Safety  Plan Rec Therapy Plan Is patient appropriate for Therapeutic Recreation?: Yes Rehab Potential: Good Treatment times per week: Min 1 TR session for community reintegration >60 minutes during LOS Estimated Length of Stay: 7 days TR Treatment/Interventions: Adaptive equipment instruction;1:1 session;Balance/vestibular training;Functional mobility training;Community reintegration;Patient/family education  Recommendations for other services: None   Discharge Criteria: Patient will be discharged from TR if patient refuses treatment 3 consecutive times without medical reason.  If treatment goals not met, if there is a change in medical status, if patient makes no progress towards goals or if patient is discharged from hospital.  The above assessment, treatment plan, treatment alternatives and goals were discussed and mutually agreed upon: by patient  [place] 08/01/2017, 2:00 PM

## 2017-08-01 NOTE — Progress Notes (Signed)
Occupational Therapy Session Note  Patient Details  Name: Earl [name] MRN: [id number] Date of Birth: 10-04-1941  Today's Date: 08/01/2017 OT Individual Time: 1406-1500 OT Individual Time Calculation (min): 54 min    Short Term Goals: Week 1:  OT Short Term Goal 1 (Week 1): STG=LTG 2/2 ELOS  Skilled Therapeutic Interventions/Progress Updates:    Pt seen to focus on self-care/ADLs and shower transfers to promote independence in self-care tasks. Pt with no c/o pain but reports of fatigue. Therapist had pt participate in tub/shower transfer simulation to prepare for d/c for at home bathing. Pt completed tub/shower transfer in ADL apartment bathroom with RW and min. A from therapist to step over tub into shower with use of grab bars. Educated on recommendation of grab bars for shower at home with pt reporting having one on inside wall. Completed toilet transfer to elevated commode with use of RW and grab bars with min guard when sit > stand from commode. Pt performed bathing in shower with shower chair and hand held shower with close supervision and verbal cues. Therapist provided verbal cues to wash buttocks with pt refuse stating "I did that earlier today". Pt completed UB/LB dressing sit > stand level at sink with close supervision for safety. Pt returned to bed with use of RW and supervision with all needs in reach.  Therapy Documentation Precautions:  Precautions Precautions: Fall Precaution Comments: dizziness Restrictions Weight Bearing Restrictions: No  Vital Signs: Therapy Vitals Temp: 98.1 F (36.7 C) Temp Source: Oral Pulse Rate: (!) 104 Resp: 20 BP: 131/83 Patient Position (if appropriate): Lying Oxygen Therapy SpO2: 100 % O2 Device: Not Delivered  See Function Navigator for Current Functional Status.   Therapy/Group: Individual Therapy  Hoyt Koch 08/01/2017, 4:02 PM

## 2017-08-01 NOTE — Progress Notes (Signed)
I was called by our device clinic RN, made aware of transmission made today of loop that showed an episode of PAFib 9/8/7, given hx of cryptogenic stroke, Dr. Caryl Comes in review felt consideration for anticoagulation should be done.  I called and made Earl Chew, PA, in rehab who is following the patient aware.  She will follow up and review for [facility] initiation.  [name], PA-C

## 2017-08-01 NOTE — Telephone Encounter (Signed)
Manual transmission received and reviewed with Dr. Caryl Comes.  Per Dr. Caryl Comes, "tachy" episodes from 07/29/17 show A-fib.  Per Dr. Caryl Comes, plan to initiate [facility].  Contacted Dillon Bjork, [place], at [facility].  She made Earl [name], [place], aware of findings per progress note and rehab team will review for [facility] initiation.

## 2017-08-01 NOTE — Progress Notes (Signed)
Contacted Dr. Erlinda Hong for input--patient 7 days out from stroke. No significant hemorrhage and Ok to start Eliquis 5 mg bid. No need for ASA or plavix.

## 2017-08-01 NOTE — Progress Notes (Signed)
Physical Therapy Session Note  Patient Details  Name: Moxon Messler MRN: [id number] Date of Birth: [birth year]  Today's Date: 08/01/2017 PT Individual Time: 1300-1330 PT Individual Time Calculation (min): 30 min    Skilled Therapeutic Interventions/Progress Updates:   pt c/o fatigue but no pain. Pt performed sit <>stand min A with RW. Pt ambulated min A with RW 200 ft. While performing alternating head movements (up/down, left/right) with minimal cueing of stepping and balance control. Pt performed 3 sets of standing balance with supervision, alternating rapid head movements (up/down. Left/ right). Pt lost balance requiring min A to correct the beginning but improved quickly with practice.   Therapy Documentation Precautions:  Precautions Precautions: Fall Precaution Comments: dizziness Restrictions Weight Bearing Restrictions: No   Pain: Pain Assessment Pain Assessment: No/denies pain   See Function Navigator for Current Functional Status.   Therapy/Group: Individual Therapy  Nadya Fedun 08/01/2017, 1:53 PM

## 2017-08-01 NOTE — Telephone Encounter (Signed)
Spoke with patient's nurse, Wells Guiles, at inpatient rehab.  She will plan to call back in a few minutes for assistance sending a manual transmission.  Assisted Wells Guiles with sending a manual transmission for review.  Transmission successful per monitor, will review with Dr. Caryl Comes when it is received.

## 2017-08-01 NOTE — Progress Notes (Signed)
[place] PHYSICAL MEDICINE & REHABILITATION     PROGRESS NOTE  Subjective/Complaints:  Pt seen laying in bed this AM.  He slept well overnight. He is enjoying therapies.   ROS: Denies CP, SOB, N/V/D.  Objective: Vital Signs: Blood pressure (!) 141/77, pulse 95, temperature 98.6 F (37 C), temperature source Oral, resp. rate 17, SpO2 100 %. No results found. No results for input(s): WBC, HGB, HCT, PLT in the last 72 hours. No results for input(s): NA, K, CL, GLUCOSE, BUN, CREATININE, CALCIUM in the last 72 hours.  Invalid input(s): CO CBG (last 3)   Recent Labs  07/31/17 1650 07/31/17 2122 08/01/17 0605  GLUCAP 250* 119* 210*    Wt Readings from Last 3 Encounters:  07/26/17 82.1 kg (181 lb 1.6 oz)  04/14/17 87.2 kg (192 lb 3.2 oz)  12/23/16 88.5 kg (195 lb)    Physical Exam:  BP (!) 141/77 (BP Location: Right Arm)   Pulse 95   Temp 98.6 F (37 C) (Oral)   Resp 17   SpO2 100%  Constitutional: He appears well-developedand well-nourished. No distress.  HENT: Normocephalicand atraumatic.  Eyes: EOMare normal. No discharge.  Cardiovascular: RRR. No JVD. Respiratory: Effort normal and breath sounds normal.  GI: Bowel sounds are normal. He exhibits no distension.  Musculoskeletal: He exhibits no edemaor tenderness.  Neurological: He is alertand oriented.  Speech clear.  Follow commands without difficulty.  Mild ataxia RUE  Motor: RUE/RLE grossly 4+/5 prox to distal (stable). LUE/LLE 5/5.  No sensory deficits.  Skin: Skin is warmand dry. He is not diaphoretic.  Psychiatric: He has a normal mood and affect. His behavior is normal. Judgmentand thought contentnormal.   Assessment/Plan: 1. 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 Physiatrist is providing close team supervision and 24 hour management of active medical problems listed  below. Physiatrist and rehab team continue to assess barriers to discharge/monitor patient progress toward functional and medical goals.  Function:  Bathing Bathing position   Position: Shower  Bathing parts Body parts bathed by patient: Right arm, Left arm, Abdomen, Chest, Front perineal area, Buttocks, Right upper leg, Left upper leg, Right lower leg, Left lower leg Body parts bathed by helper: Back  Bathing assist Assist Level: Touching or steadying assistance(Pt > 75%)      Upper Body Dressing/Undressing Upper body dressing   What is the patient wearing?: Pull over shirt/dress     Pull over shirt/dress - Perfomed by patient: Thread/unthread right sleeve, Thread/unthread left sleeve, Put head through opening, Pull shirt over trunk          Upper body assist Assist Level: Set up   Set up : To obtain clothing/put away  Lower Body Dressing/Undressing Lower body dressing   What is the patient wearing?: Pants, Non-skid slipper socks Underwear - Performed by patient: Thread/unthread right underwear leg, Thread/unthread left underwear leg, Pull underwear up/down   Pants- Performed by patient: Thread/unthread right pants leg, Thread/unthread left pants leg, Pull pants up/down   Non-skid slipper socks- Performed by patient: Don/doff right sock, Don/doff left sock                    Lower body assist Assist for lower body dressing: Touching or steadying assistance (Pt > 75%)      Toileting Toileting Toileting activity did not occur: No continent bowel/bladder event Toileting steps completed by patient: Adjust clothing prior to toileting,  Performs perineal hygiene, Adjust clothing after toileting Toileting steps completed by helper: Adjust clothing prior to toileting, Performs perineal hygiene, Adjust clothing after toileting (per Lawson Fiscal, NT report) Toileting Assistive Devices: Grab bar or rail  Toileting assist Assist level: Touching or steadying assistance  (Pt.75%)   Transfers Chair/bed transfer   Chair/bed transfer method: Stand pivot Chair/bed transfer assist level: Touching or steadying assistance (Pt > 75%) Chair/bed transfer assistive device: Armrests, Medical sales representative     Max distance: 50 Assist level: Touching or steadying assistance (Pt > 75%)   Wheelchair   Type: Manual Max wheelchair distance: 150' Assist Level: Supervision or verbal cues  Cognition Comprehension Comprehension assist level: Follows basic conversation/direction with no assist  Expression Expression assist level: Set-up assist with assistive device  Social Interaction Social Interaction assist level: Interacts appropriately with others - No medications needed.  Problem Solving Problem solving assist level: Solves basic problems with no assist  Memory Memory assist level: More than reasonable amount of time    Medical Problem List and Plan: 1. Functional and mobility deficitssecondary to right cerebellar infarct  Cont CIR 2. DVT Prophylaxis/Anticoagulation: Pharmaceutical: Lovenox 3. Neuropaty/Pain Management: Continue gabapentin.  4. Mood: patient very motivated. LCSW to follow for evaluation and support.  5. Neuropsych: This patient iscapable of making decisions on hisown behalf. 6. Skin/Wound Care: routine pressure relief measures 7. Fluids/Electrolytes/Nutrition: Monitor I/Os 8. HTN: Monitor BP bid  Ziac, Accupril and Metoprolol PTA.   Labile, but elevated  Lisinopril 5 daily started 9/11 9. T2DM: Hgb A1c- 6.7. Monitor BS ac/hs. Po intake good--resumed glucotrol. Continue to use SSI for elevated BS.   Resumed metformin 500 daily 9/11 10. Hypothyroid: On supplement 11. BPH: On proscar.  12. Hypoalbuminemia  Supplement initiated 9/110  LOS (Days) 4 A FACE TO FACE EVALUATION WAS PERFORMED  Ankit Lorie Phenix 08/01/2017 9:30 AM

## 2017-08-01 NOTE — Progress Notes (Signed)
Physical Therapy Note  Patient Details  Name: Earl [name] MRN: [id number] Date of Birth: 1941-08-17 Today's Date: 08/01/2017    Time: 830-930 60 minutes  1:1 No c/o pain.  Gait with RW with min A in controlled environment. Slow cadence and wide BOS, cues for looking up, posture and to increase cadence.  Pt with LOB requiring max A to correct with distractions and sudden head turns.  gait without AD with min/mod A for wt shifts, cadence and balance. standing balance/coordination with tapping in sequence of 3 with mod/max A for balance.  Sit to stands withoutUE support with mod A, posterior lean. Step ups without handrail with mod A, manual facilitation for anterior wt shift.  DONAWERTH,KAREN 08/01/2017, 9:30 AM

## 2017-08-01 NOTE — Progress Notes (Signed)
Occupational Therapy Session Note  Patient Details  Name: Earl [name] MRN: [id number] Date of Birth: 11/02/1941  Today's Date: 08/01/2017 OT Individual Time: 0934-1030 OT Individual Time Calculation (min): 56 min    Short Term Goals: Week 1:  OT Short Term Goal 1 (Week 1): STG=LTG 2/2 ELOS  Skilled Therapeutic Interventions/Progress Updates:    Pt seen to focus on dynamic standing balance and endurance to promote independence in self-care tasks and IADLs. Pt with no c/o pain but reporting fatigue due to PT session but willing to participate in therapy. Pt completed table top activity with use of UE support for ~12 minutes with no LOB. Therapist increased challenge to incorporate looking up at target as pt demonstrates difficulty with looking up and maintaining balance. Pt requiring mod verbal cues for correct completion of activity and min guard while standing. Therapist challenged pt with therapeutic activity that required pt to cross midline in multiple planes and reaching overhead to promote improvement in dynamic standing balance when performing self-care tasks or IADLs without UE support requiring min guard to close supervision from therapist. Pt reported no fatigue during activity and stated "this is challenging". Therapist discussed with pt current challenges and pt goals. Pt ambulated over 200' with RW back to bedroom with close supervision and verbal cues of RW management and pace. Pt left in recliner with all needs met.  Therapy Documentation Precautions:  Precautions Precautions: Fall Precaution Comments: dizziness Restrictions Weight Bearing Restrictions: No General:   Therapy Vitals Temp: 98.1 F (36.7 C) Temp Source: Oral Pulse Rate: (!) 104 Resp: 20 BP: 131/83 Patient Position (if appropriate): Lying Oxygen Therapy SpO2: 100 % O2 Device: Not Delivered   See Function Navigator for Current Functional Status.   Therapy/Group: Individual Therapy  Hayley  Hughes 08/01/2017, 3:53 PM 

## 2017-08-01 NOTE — Plan of Care (Signed)
Called by Ms. Love, PA, that loop recorder detected short burst of Afib. Since his right moderate sized cerebellar infarct has been 7 days out, no significant hemorrhagic conversion, pt clinically doing well with CIR, recommend to stop ASA and plavix, and start DOAC with eliquis 5mg  bid. Continue other medications.   Rosalin Hawking, MD PhD Stroke Neurology 08/01/2017 3:41 PM

## 2017-08-02 ENCOUNTER — Inpatient Hospital Stay (HOSPITAL_COMMUNITY): Payer: Medicare Other

## 2017-08-02 ENCOUNTER — Inpatient Hospital Stay (HOSPITAL_COMMUNITY): Payer: Medicare Other | Admitting: Occupational Therapy

## 2017-08-02 ENCOUNTER — Inpatient Hospital Stay (HOSPITAL_COMMUNITY): Payer: Medicare Other | Admitting: *Deleted

## 2017-08-02 DIAGNOSIS — R7309 Other abnormal glucose: Secondary | ICD-10-CM | POA: Insufficient documentation

## 2017-08-02 DIAGNOSIS — I480 Paroxysmal atrial fibrillation: Secondary | ICD-10-CM

## 2017-08-02 LAB — CBC
HEMATOCRIT: 37.1 % — AB (ref 39.0–52.0)
Hemoglobin: 12.5 g/dL — ABNORMAL LOW (ref 13.0–17.0)
MCH: 28.3 pg (ref 26.0–34.0)
MCHC: 33.7 g/dL (ref 30.0–36.0)
MCV: 84.1 fL (ref 78.0–100.0)
Platelets: 162 10*3/uL (ref 150–400)
RBC: 4.41 MIL/uL (ref 4.22–5.81)
RDW: 13.1 % (ref 11.5–15.5)
WBC: 5.9 10*3/uL (ref 4.0–10.5)

## 2017-08-02 LAB — BASIC METABOLIC PANEL
Anion gap: 7 (ref 5–15)
BUN: 15 mg/dL (ref 6–20)
CHLORIDE: 108 mmol/L (ref 101–111)
CO2: 23 mmol/L (ref 22–32)
CREATININE: 1.13 mg/dL (ref 0.61–1.24)
Calcium: 8.5 mg/dL — ABNORMAL LOW (ref 8.9–10.3)
GFR calc Af Amer: >60 mL/min (ref >60)
Glucose, Bld: 159 mg/dL — ABNORMAL HIGH (ref 65–99)
POTASSIUM: 4.3 mmol/L (ref 3.5–5.1)
Sodium: 138 mmol/L (ref 135–145)

## 2017-08-02 LAB — GLUCOSE, CAPILLARY
GLUCOSE-CAPILLARY: 202 mg/dL — AB (ref 65–99)
Glucose-Capillary: 150 mg/dL — ABNORMAL HIGH (ref 65–99)
Glucose-Capillary: 122 mg/dL — ABNORMAL HIGH (ref 65–99)

## 2017-08-02 NOTE — Progress Notes (Signed)
Occupational Therapy Session Note  Patient Details  Name: Earl [name] MRN: [id number] Date of Birth: [birth year]  Today's Date: 08/02/2017 OT Individual Time: 7588-3254 (44 mins)      Short Term Goals: Week 1:  OT Short Term Goal 1 (Week 1): STG=LTG 2/2 ELOS  Skilled Therapeutic Interventions/Progress Updates:    Treatment session with focus on self-care, standing balance, and sit to stand to increase independence in self-care tasks. Bathing completed in room shower with use of shower chair and grab bars, with pt requiring min guard for LB bathing during sit > stand. LB dressing in w/c sit > stand level again with min. guard for dynamic standing balance. Engaged in standing grooming tasks at sink with min. guard from therapist to increase standing tolerance. Engaged in sit > stand from bed at lower level without UE support in preparation for d/c home. Pt completed sit > stand from lower level surface x2 without RW with min assist to facilitate anterior weight shift. With elevated surface, pt able to complete sit > stand with min guard without use RW x2. Pt left in recliner with all needs in reach.  Therapy Documentation Precautions:  Precautions Precautions: Fall Precaution Comments: dizziness Restrictions Weight Bearing Restrictions: No General:   Vital Signs: Therapy Vitals Temp: 98.2 F (36.8 C) Temp Source: Oral Pulse Rate: (!) 106 Resp: 18 BP: 132/73 Patient Position (if appropriate): Standing Oxygen Therapy SpO2: 100 % O2 Device: Not Delivered Pain:  Pt with no c/o pain  See Function Navigator for Current Functional Status.   Therapy/Group: Individual Therapy  Simonne Come 08/02/2017, 4:16 PM

## 2017-08-02 NOTE — Progress Notes (Signed)
Recreational Therapy Session Note  Patient Details  Name: Earl [name] MRN: [id number] Date of Birth: Aug 26, 1941 Today's Date: 08/02/2017  Pain: no c/o Skilled Therapeutic Interventions/Progress Updates: Pt participated in community reintegration/outing to [employer] at overall contact guard/min assist ambulatory level using RW.  Goals focused on safe community mobility, RUE use at active assist level, identification & negotiation of obstacles, accessing public restroom, energy conservation techniques/education.  See outing goal sheet in shadow chart for full details.  Ketchum Chapel 08/02/2017, 1:23 PM

## 2017-08-02 NOTE — Progress Notes (Signed)
Occupational Therapy Session Note  Patient Details  Name: Earl [name] MRN: [id number] Date of Birth: 1941/07/26  Today's Date: 08/02/2017 OT Individual Time: 1030-1230 OT Individual Time Calculation (min): 120 min    Short Term Goals: Week 1:  OT Short Term Goal 1 (Week 1): STG=LTG 2/2 ELOS  Skilled Therapeutic Interventions/Progress Updates:    Engaged in community reintegration/outing with TR with focus on functional mobility in community and increased safety awareness with accessing public restroom and maneuvering on uneven, community surfaces.  Provided min assist with RW on uneven surfaces and when navigating curb and van steps.  Discussed accessing public restroom with pt demonstrating decreased problem solving and impulsivity with attempting to push through the door with RW, despite throwing himself off balance.  Pt with use of RUE as dominant during self-feeding, did knock over drink x2 due to decreased awareness of RUE and decreased motor control.  Educated on increased awareness and attention to RUE, as well as using cups with lids to decrease spillage.  Discussed energy conservation and barriers with pt reporting increased awareness post outing.  Therapy Documentation Precautions:  Precautions Precautions: Fall Precaution Comments: dizziness Restrictions Weight Bearing Restrictions: No General:   Vital Signs: Therapy Vitals Temp: 98.2 F (36.8 C) Temp Source: Oral Pulse Rate: (!) 106 Resp: 18 BP: 132/73 Patient Position (if appropriate): Standing Oxygen Therapy SpO2: 100 % O2 Device: Not Delivered Pain:   Pt with no c/o pain  See Function Navigator for Current Functional Status.   Therapy/Group: Individual Therapy/Outing  Simonne Come 08/02/2017, 4:17 PM

## 2017-08-02 NOTE — Progress Notes (Signed)
Physical Therapy Note  Patient Details  Name: Earl [name] MRN: [id number] Date of Birth: February 15, 1941 Today's Date: 08/02/2017  1515-1600, 45 min individual tx Pain: none per pt  Pt had been sitting in recliner since lunch; as he attemtped sit> stand from recliner, he had LOB backwards, stating he felt stiff, requiring max assist to prevent fall.  PT educated pt on importance of "warming up" legs before standing.  Seated neuro re-ed via multimodal cues and demo for R/L long arc quad knee ext with ankle circles at end range, marching, bil heel raises.  Gait over level tile, pushing w/c as AD with occasional cues for upright trunk and forward gaze.  Standing neuro re-ed for sustained stretch bil heel cords with feet on wedge.  Training for sit>< stand focusing on forward wt shift, safe position of feet, loading bil LEs.  R UE fine motor training in sitting, manipulating stacking cups into pyramid shape, on surface in front of him, slightly out of BOS, without spillage x 10 cups x 2. Gait to return to room, with RW.  1 cue needed for upright trunk, but as pt fatigued, he pushed RW unsafely far in front of him and needed mod assist to regain balance.  Pt left resting in bed with all needs within reach.  See function navigator for current status.  Solymar Grace 08/02/2017, 3:40 PM

## 2017-08-02 NOTE — Progress Notes (Signed)
[place] PHYSICAL MEDICINE & REHABILITATION     PROGRESS NOTE  Subjective/Complaints:  Pt seen laying in bed this AM.  He slept well overnight.  He notes labile CBGs and states that once that is controlled he will be better.  Loop recorder also detected Afib yesterday, anticoag changed.  ROS: Denies CP, SOB, N/V/D.  Objective: Vital Signs: Blood pressure 137/86, pulse 96, temperature 99.7 F (37.6 C), temperature source Oral, resp. rate 18, SpO2 99 %. No results found.  Recent Labs  08/02/17 0658  WBC 5.9  HGB 12.5*  HCT 37.1*  PLT 162   No results for input(s): NA, K, CL, GLUCOSE, BUN, CREATININE, CALCIUM in the last 72 hours.  Invalid input(s): CO CBG (last 3)   Recent Labs  08/01/17 1626 08/01/17 2112 08/02/17 0640  GLUCAP 142* 116* 150*    Wt Readings from Last 3 Encounters:  07/26/17 82.1 kg (181 lb 1.6 oz)  04/14/17 87.2 kg (192 lb 3.2 oz)  12/23/16 88.5 kg (195 lb)    Physical Exam:  BP 137/86 (BP Location: Left Arm)   Pulse 96   Temp 99.7 F (37.6 C) (Oral)   Resp 18   SpO2 99%  Constitutional: He appears well-developedand well-nourished. No distress.  HENT: Normocephalicand atraumatic.  Eyes: EOMare normal. No discharge.  Cardiovascular: RRR. No JVD. Respiratory: Effort normal and breath sounds normal.  GI: Bowel sounds are normal. He exhibits no distension.  Musculoskeletal: He exhibits no edemaor tenderness.  Neurological: He is alertand oriented.  Speech clear.  Follow commands without difficulty.  Mild ataxia RUE  Motor: RUE/RLE grossly 4+/5 prox to distal (unchanged). LUE/LLE 5/5.  No sensory deficits.  Skin: Skin is warmand dry. He is not diaphoretic.  Psychiatric: He has a normal mood and affect. His behavior is normal. Judgmentand thought contentnormal.   Assessment/Plan: 1. 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 Physiatrist is providing close team supervision and 24 hour management of active medical problems listed below. Physiatrist and rehab team continue to assess barriers to discharge/monitor patient progress toward functional and medical goals.  Function:  Bathing Bathing position   Position: Shower  Bathing parts Body parts bathed by patient: Right arm, Left arm, Chest, Abdomen, Front perineal area, Right upper leg, Left upper leg, Right lower leg, Left lower leg Body parts bathed by helper: Back  Bathing assist Assist Level: Supervision or verbal cues (Sitting in shower chair)      Upper Body Dressing/Undressing Upper body dressing   What is the patient wearing?: Pull over shirt/dress     Pull over shirt/dress - Perfomed by patient: Thread/unthread right sleeve, Thread/unthread left sleeve, Put head through opening, Pull shirt over trunk          Upper body assist Assist Level: Supervision or verbal cues (Sitting in w/c)   Set up : To obtain clothing/put away  Lower Body Dressing/Undressing Lower body dressing   What is the patient wearing?: Pants, Non-skid slipper socks Underwear - Performed by patient: Thread/unthread right underwear leg, Thread/unthread left underwear leg, Pull underwear up/down   Pants- Performed by patient: Thread/unthread right pants leg, Thread/unthread left pants leg, Pull pants up/down   Non-skid slipper socks- Performed by patient: Don/doff right sock, Don/doff left sock                    Lower body assist Assist for lower body dressing: Touching or  steadying assistance (Pt > 75%)      Toileting Toileting Toileting activity did not occur: No continent bowel/bladder event Toileting steps completed by patient: Adjust clothing prior to toileting, Performs perineal hygiene, Adjust clothing after toileting Toileting steps completed by helper: Adjust clothing prior to toileting, Performs perineal hygiene, Adjust clothing after  toileting (per Lawson Fiscal, NT report) Toileting Assistive Devices: Grab bar or rail  Toileting assist Assist level: Touching or steadying assistance (Pt.75%)   Transfers Chair/bed transfer   Chair/bed transfer method: Stand pivot Chair/bed transfer assist level: Touching or steadying assistance (Pt > 75%) Chair/bed transfer assistive device: Armrests, Medical sales representative     Max distance: 50 Assist level: Touching or steadying assistance (Pt > 75%)   Wheelchair   Type: Manual Max wheelchair distance: 150' Assist Level: Supervision or verbal cues  Cognition Comprehension Comprehension assist level: Follows basic conversation/direction with no assist  Expression Expression assist level: Set-up assist with assistive device  Social Interaction Social Interaction assist level: Interacts appropriately with others - No medications needed.  Problem Solving Problem solving assist level: Solves basic problems with no assist  Memory Memory assist level: More than reasonable amount of time    Medical Problem List and Plan: 1. Functional and mobility deficitssecondary to right cerebellar infarct  Cont CIR 2. DVT Prophylaxis/Anticoagulation: Pharmaceutical: Lovenox 3. Neuropaty/Pain Management: Continue gabapentin.  4. Mood: patient very motivated. LCSW to follow for evaluation and support.  5. Neuropsych: This patient iscapable of making decisions on hisown behalf. 6. Skin/Wound Care: routine pressure relief measures 7. Fluids/Electrolytes/Nutrition: Monitor I/Os 8. HTN: Monitor BP bid  Ziac, Accupril and Metoprolol PTA.   Lisinopril 5 daily started 9/11  Controlled 9/12 9. T2DM: Hgb A1c- 6.7. Monitor BS ac/hs. Continue to use SSI for elevated BS.   Resumed home meds, metformin 500 QID, glipizide TID on 9/11  Labile on 9/12 with reading >400, cont to monitor due to recent change 10. Hypothyroid: On supplement 11. BPH: On proscar.  12.  Hypoalbuminemia  Supplement initiated 9/110 13. PAF  Started on Eliquis after discussion with Cards and Neuro on 9/11  LOS (Days) 5 A FACE TO FACE EVALUATION WAS PERFORMED  Ankit Lorie Phenix 08/02/2017 8:09 AM

## 2017-08-02 NOTE — Discharge Instructions (Addendum)
CInpatient Rehab Discharge Instructions  Benjimen Kelley Discharge date and time: 08/08/17   Activities/Precautions/ Functional Status: Activity: no lifting, driving, or strenuous exercise till cleared by MD.  Diet: cardiac diet and diabetic diet Wound Care: keep wound clean and dry   Functional status:  ___ No restrictions     ___ Walk up steps independently _X__ 24/7 supervision/assistance   ___ Walk up steps with assistance ___ Intermittent supervision/assistance  ___ Bathe/dress independently ___ Walk with walker     ___ Bathe/dress with assistance _X__ Walk with supervision    ___ Shower independently ___ Walk with assistance    _X__ Shower with supervision _X__ No alcohol     ___ Return to work/school ________   Special Instructions: 1. No driving till cleared by MD. 2. Check blood sugars twice a day and follow up with primary MD for adjustment if needed.    COMMUNITY REFERRALS UPON DISCHARGE:    Outpatient: PT     OT                   Agency: [facility] Phone: [phone]                Appointment Date/Time:  9/20 @ 1:15 - 2:45  (please arrive at 12:45)  Medical Equipment/Items Ordered:  Rolling walker, bedside commode and tub bench                                                       Agency/Supplier:  [facility] @ [phone]    My questions have been answered and I understand these instructions. I will adhere to these goals and the provided educational materials after my discharge from the hospital.  Patient/Caregiver Signature _______________________________ Date __________  Clinician Signature _______________________________________ Date __________  Please bring this form and your medication list with you to all your follow-up doctor's appointments.    STROKE/TIA DISCHARGE INSTRUCTIONS SMOKING Cigarette smoking nearly doubles your risk of having a stroke & is the single most alterable risk factor  If you smoke or have smoked in the  last 12 months, you are advised to quit smoking for your health.  Most of the excess cardiovascular risk related to smoking disappears within a year of stopping.  Ask you doctor about anti-smoking medications  [place] Quit Line: 1-800-QUIT NOW  Free Smoking Cessation Classes [phone]  CHOLESTEROL Know your levels; limit fat & cholesterol in your diet  Lipid Panel     Component Value Date/Time   CHOL 147 07/26/2017 0551   TRIG 45 07/26/2017 0551   HDL 51 07/26/2017 0551   CHOLHDL 2.9 07/26/2017 0551   VLDL 9 07/26/2017 0551   LDLCALC 87 07/26/2017 0551      Many patients benefit from treatment even if their cholesterol is at goal.  Goal: Total Cholesterol (CHOL) less than 160  Goal:  Triglycerides (TRIG) less than 150  Goal:  HDL greater than 40  Goal:  LDL (LDLCALC) less than 100   BLOOD PRESSURE American Stroke Association blood pressure target is less that 120/80 mm/Hg  Your discharge blood pressure is:  BP: 132/73  Monitor your blood pressure  Limit your salt and alcohol intake  Many individuals will require more than one medication for high blood pressure  DIABETES (A1c is a blood sugar average for last 3  months) Goal HGBA1c is under 7% (HBGA1c is blood sugar average for last 3 months)  Diabetes:     Lab Results  Component Value Date   HGBA1C 6.7 (H) 07/26/2017     Your HGBA1c can be lowered with medications, healthy diet, and exercise.  Check your blood sugar as directed by your physician  Call your physician if you experience unexplained or low blood sugars.  PHYSICAL ACTIVITY/REHABILITATION Goal is 30 minutes at least 4 days per week  Activity: Increase activity slowly, and No driving, Therapies: see above Return to work: N/A  Activity decreases your risk of heart attack and stroke and makes your heart stronger.  It helps control your weight and blood pressure; helps you relax and can improve your mood.  Participate in a regular exercise  program.  Talk with your doctor about the best form of exercise for you (dancing, walking, swimming, cycling).  DIET/WEIGHT Goal is to maintain a healthy weight  Your discharge diet is: Diet heart healthy/carb modified Room service appropriate? Yes; Fluid consistency: Thin  liquids Your height is:   Your current weight is: 181 lbs Your Body Mass Index (BMI) is:  24.1  Following the type of diet specifically designed for you will help prevent another stroke.  You are at goal weight    Your goal Body Mass Index (BMI) is 19-24.  Healthy food habits can help reduce 3 risk factors for stroke:  High cholesterol, hypertension, and excess weight.  RESOURCES Stroke/Support Group:  Call [phone]   STROKE EDUCATION PROVIDED/REVIEWED AND GIVEN TO PATIENT Stroke warning signs and symptoms How to activate emergency medical system (call 911). Medications prescribed at discharge. Need for follow-up after discharge. Personal risk factors for stroke. Pneumonia vaccine given:  Flu vaccine given:  My questions have been answered, the writing is legible, and I understand these instructions.  I will adhere to these goals & educational materials that have been provided to me after my discharge from the hospital.       Information on my medicine - ELIQUIS (apixaban)  This medication education was reviewed with me or my healthcare representative as part of my discharge preparation.    Why was Eliquis prescribed for you? Eliquis was prescribed for you to reduce the risk of a blood clot forming that can cause a stroke if you have a medical condition called atrial fibrillation (a type of irregular heartbeat).  What do You need to know about Eliquis ? Take your Eliquis TWICE DAILY - one tablet in the morning and one tablet in the evening with or without food. If you have difficulty swallowing the tablet whole please discuss with your pharmacist how to take the medication safely.  Take Eliquis  exactly as prescribed by your doctor and DO NOT stop taking Eliquis without talking to the doctor who prescribed the medication.  Stopping may increase your risk of developing a stroke.  Refill your prescription before you run out.  After discharge, you should have regular check-up appointments with your healthcare provider that is prescribing your Eliquis.  In the future your dose may need to be changed if your kidney function or weight changes by a significant amount or as you get older.  What do you do if you miss a dose? If you miss a dose, take it as soon as you remember on the same day and resume taking twice daily.  Do not take more than one dose of ELIQUIS at the same time to make up a missed  dose.  Important Safety Information A possible side effect of Eliquis is bleeding. You should call your healthcare provider right away if you experience any of the following: ? Bleeding from an injury or your nose that does not stop. ? Unusual colored urine (red or dark brown) or unusual colored stools (red or black). ? Unusual bruising for unknown reasons. ? A serious fall or if you hit your head (even if there is no bleeding).  Some medicines may interact with Eliquis and might increase your risk of bleeding or clotting while on Eliquis. To help avoid this, consult your healthcare provider or pharmacist prior to using any new prescription or non-prescription medications, including herbals, vitamins, non-steroidal anti-inflammatory drugs (NSAIDs) and supplements.  This website has more information on Eliquis (apixaban): http://www.eliquis.com/eliquis/home

## 2017-08-03 ENCOUNTER — Inpatient Hospital Stay (HOSPITAL_COMMUNITY): Payer: Medicare Other | Admitting: Physical Therapy

## 2017-08-03 ENCOUNTER — Inpatient Hospital Stay (HOSPITAL_COMMUNITY): Payer: Medicare Other | Admitting: Occupational Therapy

## 2017-08-03 ENCOUNTER — Encounter (HOSPITAL_COMMUNITY): Payer: Self-pay

## 2017-08-03 DIAGNOSIS — D62 Acute posthemorrhagic anemia: Secondary | ICD-10-CM

## 2017-08-03 LAB — GLUCOSE, CAPILLARY
Glucose-Capillary: 94 mg/dL (ref 65–99)
Glucose-Capillary: 135 mg/dL — ABNORMAL HIGH (ref 65–99)
Glucose-Capillary: 119 mg/dL — ABNORMAL HIGH (ref 65–99)
Glucose-Capillary: 211 mg/dL — ABNORMAL HIGH (ref 65–99)

## 2017-08-03 NOTE — Plan of Care (Signed)
Problem: RH SKIN INTEGRITY Goal: RH STG SKIN FREE OF INFECTION/BREAKDOWN Outcome: Progressing No skin infection noted  Problem: RH SAFETY Goal: RH STG ADHERE TO SAFETY PRECAUTIONS W/ASSISTANCE/DEVICE STG Adhere to Safety Precautions With Assistance/Device.  Outcome: Progressing Patient is aware of safety precautions Goal: RH STG DECREASED RISK OF FALL WITH ASSISTANCE STG Decreased Risk of Fall With Assistance.  Outcome: Progressing No fall or injury noted

## 2017-08-03 NOTE — Progress Notes (Addendum)
Physical Therapy Note  Patient Details  Name: Earl [name] MRN: [id number] Date of Birth: 1941-04-14 Today's Date: 08/03/2017    Time: 830-925 55 minutes  1:1 No c/o pain.  Gait with RW 175' x 2 with manual facilitation for increased cadence and posture, pt able to walk with min guard/supervision.  Gait with RW through obstacles with min A for stepping over obstacles, supervision for turns.  Gait without RW through obstacles with min/mod A for tight turns and stepping over obstacles.  Squat and reach task for LE strengthening and coordination with min A.  Step ups to 4'' step to simulate home entry without handrails.  6 x 3 with min/mod A, improving with repetition and manual facilitation to prevent posterior LOB.   Time: 1130-1155 25 minutes  1:1 No c/o pain.  Pt asleep when PT enters, awakens easily.  Pt requires mod A for bed mobility and transfers upon first waking. Gait to bathroom with RW with mod A due to LOB posteriorly and left.  Throughout session pt improved with to min A with transfers and gait. nustep x 8 minutes level 4 for LE and UE strengthening.   Munira Polson 08/03/2017, 9:25 AM

## 2017-08-03 NOTE — Progress Notes (Signed)
[place] PHYSICAL MEDICINE & REHABILITATION     PROGRESS NOTE  Subjective/Complaints:  Patient seen sitting up in bed eating breakfast this morning. He states he slept well overnight. He says he is doing well, but did not give the coffee that he ordered.  ROS: Denies CP, SOB, N/V/D.  Objective: Vital Signs: Blood pressure (!) 145/83, pulse (!) 108, temperature 98.9 F (37.2 C), temperature source Oral, resp. rate 18, SpO2 100 %. No results found.  Recent Labs  08/02/17 0658  WBC 5.9  HGB 12.5*  HCT 37.1*  PLT 162    Recent Labs  08/02/17 0658  NA 138  K 4.3  CL 108  GLUCOSE 159*  BUN 15  CREATININE 1.13  CALCIUM 8.5*   CBG (last 3)   Recent Labs  08/02/17 1611 08/02/17 2052 08/03/17 0609  GLUCAP 202* 122* 94    Wt Readings from Last 3 Encounters:  07/26/17 82.1 kg (181 lb 1.6 oz)  04/14/17 87.2 kg (192 lb 3.2 oz)  12/23/16 88.5 kg (195 lb)    Physical Exam:  BP (!) 145/83 (BP Location: Left Arm)   Pulse (!) 108   Temp 98.9 F (37.2 C) (Oral)   Resp 18   SpO2 100%  Constitutional: He appears well-developedand well-nourished. No distress.  HENT: Normocephalicand atraumatic.  Eyes: EOMare normal. No discharge.  Cardiovascular: RRR. No JVD. Respiratory: Effort normal and breath sounds normal.  GI: Bowel sounds are normal. He exhibits no distension.  Musculoskeletal: He exhibits no edemaor tenderness.  Neurological: He is alertand oriented.  Speech clear.  Follow commands without difficulty.  Mild ataxia RUE  Motor: RUE/RLE grossly 4+/5 prox to distal (improving). LUE/LLE 5/5.  Skin: Skin is warmand dry. He is not diaphoretic.  Psychiatric: He has a normal mood and affect. His behavior is normal. Judgmentand thought contentnormal.   Assessment/Plan: 1. 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 Physiatrist is providing  close team supervision and 24 hour management of active medical problems listed below. Physiatrist and rehab team continue to assess barriers to discharge/monitor patient progress toward functional and medical goals.  Function:  Bathing Bathing position   Position: Shower  Bathing parts Body parts bathed by patient: Right arm, Left arm, Chest, Abdomen, Front perineal area, Buttocks, Right upper leg, Left upper leg, Right lower leg, Left lower leg Body parts bathed by helper: Back  Bathing assist Assist Level: Supervision or verbal cues      Upper Body Dressing/Undressing Upper body dressing   What is the patient wearing?: Pull over shirt/dress     Pull over shirt/dress - Perfomed by patient: Thread/unthread right sleeve, Thread/unthread left sleeve, Put head through opening, Pull shirt over trunk          Upper body assist Assist Level: Supervision or verbal cues   Set up : To obtain clothing/put away  Lower Body Dressing/Undressing Lower body dressing   What is the patient wearing?: Pants, Non-skid slipper socks Underwear - Performed by patient: Thread/unthread right underwear leg, Thread/unthread left underwear leg, Pull underwear up/down   Pants- Performed by patient: Thread/unthread right pants leg, Thread/unthread left pants leg, Pull pants up/down   Non-skid slipper socks- Performed by patient: Don/doff right sock, Don/doff left sock   Socks - Performed by patient: Don/doff right sock, Don/doff left sock   Shoes - Performed by patient: Don/doff right shoe, Don/doff left shoe  Lower body assist Assist for lower body dressing: Set up, Supervision or verbal cues   Set up : To obtain clothing/put away  Toileting Toileting Toileting activity did not occur: No continent bowel/bladder event Toileting steps completed by patient: Adjust clothing prior to toileting, Performs perineal hygiene, Adjust clothing after toileting Toileting steps completed by helper:  Adjust clothing prior to toileting, Performs perineal hygiene, Adjust clothing after toileting (per Lawson Fiscal, NT report) Toileting Assistive Devices: Grab bar or rail  Toileting assist Assist level: Supervision or verbal cues   Transfers Chair/bed transfer   Chair/bed transfer method: Ambulatory Chair/bed transfer assist level: Touching or steadying assistance (Pt > 75%) Chair/bed transfer assistive device: Armrests, Medical sales representative     Max distance: 150 Assist level: Moderate assist (Pt 50 - 74%)   Wheelchair   Type: Manual Max wheelchair distance: 150' Assist Level: Supervision or verbal cues  Cognition Comprehension Comprehension assist level: Follows basic conversation/direction with no assist  Expression Expression assist level: Expresses basic needs/ideas: With extra time/assistive device  Social Interaction Social Interaction assist level: Interacts appropriately with others - No medications needed.  Problem Solving Problem solving assist level: Solves basic problems with no assist  Memory Memory assist level: Recognizes or recalls 90% of the time/requires cueing < 10% of the time    Medical Problem List and Plan: 1. Functional and mobility deficitssecondary to right cerebellar infarct  Cont CIR 2. DVT Prophylaxis/Anticoagulation: Pharmaceutical: Lovenox 3. Neuropaty/Pain Management: Continue gabapentin.  4. Mood: patient very motivated. LCSW to follow for evaluation and support.  5. Neuropsych: This patient iscapable of making decisions on hisown behalf. 6. Skin/Wound Care: routine pressure relief measures 7. Fluids/Electrolytes/Nutrition: Monitor I/Os 8. HTN: Monitor BP bid  Ziac, Accupril and Metoprolol PTA.   Lisinopril 5 daily started 9/11  Relatively controlled on 9/13 9. T2DM: Hgb A1c- 6.7. Monitor BS ac/hs. Continue to use SSI for elevated BS.   Resumed home meds, metformin 500 QID, glipizide TID on 9/11  Improving control  in 9/13 10. Hypothyroid: On supplement 11. BPH: On proscar.  12. Hypoalbuminemia  Supplement initiated 9/110 13. PAF  Started on Eliquis after discussion with Cards and Neuro on 9/11 14. ABLA  Hb 12.5 on 9/12  Cont to monitor  LOS (Days) 6 A FACE TO FACE EVALUATION WAS PERFORMED  Anelisse Jacobson Lorie Phenix 08/03/2017 8:31 AM

## 2017-08-03 NOTE — Progress Notes (Signed)
Occupational Therapy Session Note  Patient Details  Name: Earl [name] MRN: [id number] Date of Birth: [birth year]  Today's Date: 08/03/2017 OT Individual Time: 1761-6073 and 1406-1500 OT Individual Time Calculation (min): 45 min and 54 min   Short Term Goals: Week 1:  OT Short Term Goal 1 (Week 1): STG=LTG 2/2 ELOS  Skilled Therapeutic Interventions/Progress Updates:    1) Treatment session with focus on functional mobility and self-care retraining.  Pt finishing breakfast upon arrival.  Ambulated to room shower with RW with min cues for anterior weight shift with sit > stand and min guard during ambulation.  Bathing completed with distant supervision at sit > stand level when washing buttocks.  Completed dressing at sit > stand level with improvements in standing without UE support while pulling pants over hips.  Grooming tasks completed in standing to increase standing balance and tolerance.  Left upright in recliner with all needs in reach.  2) Treatment session with focus on RUE NMR and standing balance.  Pt received on toilet, reporting tech assisted with transfer to toilet.  Min guard with ambulation out of bathroom, completing hand hygiene in standing at sink.  Ambulated ~150 feet to Day Room with RW and min guard with slow gait speed.  Engaged in reaching task in standing with focus on motor control and precision when reaching with RUE.  Utilized numbered cups and discs to increase challenge with precision and recall as well as incorporating cognitive challenge.  Pt with improved control with reaching with RUE when focused on task, will benefit from increased challenge with divided attention.  Pt left in Day Room for stroke support group.  Therapy Documentation Precautions:  Precautions Precautions: Fall Precaution Comments: dizziness Restrictions Weight Bearing Restrictions: No Pain:  Pt with no c/o pain  See Function Navigator for Current Functional  Status.   Therapy/Group: Individual Therapy  Simonne Come 08/03/2017, 8:58 AM

## 2017-08-04 ENCOUNTER — Inpatient Hospital Stay (HOSPITAL_COMMUNITY): Payer: Medicare Other | Admitting: Occupational Therapy

## 2017-08-04 ENCOUNTER — Inpatient Hospital Stay (HOSPITAL_COMMUNITY): Payer: Medicare Other

## 2017-08-04 LAB — GLUCOSE, CAPILLARY
GLUCOSE-CAPILLARY: 189 mg/dL — AB (ref 65–99)
Glucose-Capillary: 143 mg/dL — ABNORMAL HIGH (ref 65–99)
Glucose-Capillary: 183 mg/dL — ABNORMAL HIGH (ref 65–99)
Glucose-Capillary: 135 mg/dL — ABNORMAL HIGH (ref 65–99)

## 2017-08-04 NOTE — Patient Care Conference (Signed)
Inpatient RehabilitationTeam Conference and Plan of Care Update Date: 08/02/2017   Time: 2:35 PM    Patient Name: Earl [name]      Medical Record Number: [id number]  Date of Birth: February 08, 1941 Sex: Male         Room/Bed: 4W05C/4W05C-01 Payor Info: Payor: Marine scientist / Plan: [employer] MEDICARE / Product Type: *No Product type* /    Admitting Diagnosis: CVA  Admit Date/Time:  07/28/2017  6:27 PM Admission Comments: No comment available   Primary Diagnosis:  <principal problem not specified> Principal Problem: <principal problem not specified>  Patient Active Problem List   Diagnosis Date Noted  . Acute blood loss anemia   . Labile blood glucose   . PAF (paroxysmal atrial fibrillation) ([place])   . Hypoalbuminemia due to protein-calorie malnutrition ([place])   . Diabetes mellitus ([place])   . Benign essential HTN   . Ataxia, post-stroke   . Cerebellar stroke ([place])   . Dizziness 07/25/2017  . Hypothyroidism 09/13/2016  . BMI 26.53,adult 08/28/2015  . Medicare annual wellness visit, subsequent 08/28/2015  . 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 Vitamin D deficiency 04/29/2014  . Medication management 01/03/2014  . 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 BPH (benign prostatic hyperplasia)   . Testosterone deficiency   . 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 Anemia 11/07/2013  . Hypertension 11/07/2013  . Hyperlipidemia 11/07/2013  . Paroxysmal atrial fibrillation ([place]) 09/18/2012    Expected Discharge Date: Expected Discharge Date: 08/08/17  Team Members Present: Physician leading conference: Dr. Delice Lesch Social Worker Present: Lennart Pall, LCSW Nurse Present: Other (comment) Gertha Calkin., RN) PT Present: Georjean Mode, PT OT Present: Simonne Come, OT SLP Present: Windell Moulding, SLP PPS Coordinator present : Ileana Ladd, PT     Current Status/Progress Goal Weekly Team Focus  Medical   Functional and mobility  deficits secondary to right cerebellar infarct  Improve mobility, safety, HTN, DM  See above   Bowel/Bladder   remains continent of  B&B     Continued continence   Swallow/Nutrition/ Hydration             ADL's   min guard bathing and dress; min A for transfers   mod I overall; supervision for tub transfer, IADLs, and bathing  standing balance, safety awareness, RUE NMR, ADL retraining   Mobility   min A gait and transfers  supervision overall  balance, NMR, gait   Communication             Safety/Cognition/ Behavioral Observations            Pain   Pt has no pain     Remain pain free   Skin   Free of infection and breakdown     Daily skin inspections    Rehab Goals Patient on target to meet rehab goals: Yes *See Care Plan and progress notes for long and short-term goals.     Barriers to Discharge  Current Status/Progress Possible Resolutions Date Resolved   Physician    Medical stability     See above  Therapies, optimize DM/BP meds, anticoag started      Nursing                  PT                    OT  SLP                SW                Discharge Planning/Teaching Needs:  Plan home with wife who can provide supervision only.  Teaching to be scheduled.   Team Discussion:  Variable BSs;  Adjusting BP meds.  Afib picked up on loop recorder.  Cont b/b and no skin issues.  amb 200' with min assist due to poor balance.  Mod ind/ supervision goals overall.  Revisions to Treatment Plan:  None    Continued Need for Acute Rehabilitation Level of Care: The patient requires daily medical management by a physician with specialized training in physical medicine and rehabilitation for the following conditions: Daily direction of a multidisciplinary physical rehabilitation program to ensure safe treatment while eliciting the highest outcome that is of practical value to the patient.: Yes Daily medical management of patient stability for increased  activity during participation in an intensive rehabilitation regime.: Yes Daily analysis of laboratory values and/or radiology reports with any subsequent need for medication adjustment of medical intervention for : Neurological problems;Other;Diabetes problems;Blood pressure problems  Jenevieve Kirschbaum, [place] 08/04/2017, 9:54 AM

## 2017-08-04 NOTE — Telephone Encounter (Signed)
Patient was started on Eliquis on 08/01/17 per notes.  Encounter closed.
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
## 2017-08-04 NOTE — Progress Notes (Signed)
Physical Therapy Session Note  Patient Details  Name: Earl [name] MRN: [id number] Date of Birth: 1941-07-11  Today's Date: 08/04/2017 PT Individual Time: 1330-1430 PT Individual Time Calculation (min): 60 min   Short Term Goals: Week 1:  PT Short Term Goal 1 (Week 1): =LTGs due to ELOS  Skilled Therapeutic Interventions/Progress Updates:    Functional gait and transfers to complete toileting needs prior to leaving room at close supervision to steadying assist level (mild LOB over threshold due to poor RW placement) for mobility and balance with cues for safety. Gait training with RW on unit between therapy gyms with steadying assist (2 episodes of significant LOB requiring heavy min assist to recover, other intermittent steadying assist) with cues for maintaining body closer to RW for improved posture and balance. Stair negotiation training with R rail to simulate home environment with min assist and cues for sequencing x 12 steps in total. Practiced curb step negotiation to simulate sunken rooms in home with steadying assist for balance and cues for positioning of RW and feet closer to step x 4 times. NMR to address postural control and balance retraining throughout session with sit <> stands and dynamic gait (with toe taps on cone as passes and navigated obstacles) as well as on Biodex for random control and weightshifting (anterior/posterior) as pt demonstrating decreased anterior weightshift during random control activity with compliant surface on level 8 to increase challenge. Handoff to OT end of session.   Therapy Documentation Precautions:  Precautions Precautions: Fall Precaution Comments: dizziness Restrictions Weight Bearing Restrictions: No   Pain:  Denies pain.   See Function Navigator for Current Functional Status.   Therapy/Group: Individual Therapy  Canary Brim Ivory Broad, PT, DPT  08/04/2017, 2:36 PM

## 2017-08-04 NOTE — Plan of Care (Signed)
Problem: RH SAFETY Goal: RH STG ADHERE TO SAFETY PRECAUTIONS W/ASSISTANCE/DEVICE STG Adhere to Safety Precautions With Assistance/Device.  Outcome: Progressing Safety precautions maintained, no fall or injury

## 2017-08-04 NOTE — Progress Notes (Signed)
Occupational Therapy Session Note  Patient Details  Name: Earl [name] MRN: [id number] Date of Birth: 03-28-1941  Today's Date: 08/04/2017 OT Individual Time: 0900-1000 OT Individual Time Calculation (min): 60 min    Short Term Goals: Week 1:  OT Short Term Goal 1 (Week 1): STG=LTG 2/2 ELOS  Skilled Therapeutic Interventions/Progress Updates:    Treatment session focused on ADL training, transfer training, balance training, safety awareness, endurance training, and pt education. Upon entering room, pt in room with ADLs completed in prior session. Pt agreeable to completing ADL/self care training for tub transfers that simulate pts home. Pt used walker to ambulate in hallway to therapy bathroom. therapist provided verbal cues for safe functional mobility with CGA for balance. Pt completed 2 tub transfers<>walker using grab bars in therapy tub with close guard assistance for safety and verbal cues for hand/foot placement. Pt practiced transfers from different surfaces with focus on hand/foot placement and correct progression for transfers. Pt completed total body endurance training on pedex at level 4 for 5 minutes without rest breaks. Pt. Returned to room via walker with all needs met.   Therapy Documentation Precautions:  Precautions Precautions: Fall Precaution Comments: dizziness Restrictions Weight Bearing Restrictions: No   Pain: Pain Assessment Pain Assessment: No/denies pain See Function Navigator for Current Functional Status.   Therapy/Group: Individual Treatment   Delon [place] 08/04/2017, 12:20 PM

## 2017-08-04 NOTE — Progress Notes (Signed)
[name] & [facility]ILITATION     PROGRESS NOTE  Subjective/Complaints:  Pt seen laying in bed this AM.  He slept well overnight.  He denies complaints.   ROS: Denies CP, SOB, N/V/D.  Objective: Vital Signs: Blood pressure 131/71, pulse 60, temperature 99.4 F (37.4 C), temperature source Oral, resp. rate 16, SpO2 99 %. No results found.  Recent Labs  08/02/17 0658  WBC 5.9  HGB 12.5*  HCT 37.1*  PLT 162    Recent Labs  08/02/17 0658  NA 138  K 4.3  CL 108  GLUCOSE 159*  BUN 15  CREATININE 1.13  CALCIUM 8.5*   CBG (last 3)   Recent Labs  08/03/17 1607 08/03/17 2117 08/04/17 0640  GLUCAP 119* 211* 143*    Wt Readings from Last 3 Encounters:  07/26/17 82.1 kg (181 lb 1.6 oz)  04/14/17 87.2 kg (192 lb 3.2 oz)  12/23/16 88.5 kg (195 lb)    Physical Exam:  BP 131/71 (BP Location: Left Arm)   Pulse 60   Temp 99.4 F (37.4 C) (Oral)   Resp 16   SpO2 99%  Constitutional: He appears well-developedand well-nourished. No distress.  HENT: Normocephalicand atraumatic.  Eyes: EOMare normal. No discharge.  Cardiovascular: RRR. No JVD. Respiratory: Effort normal and breath sounds normal.  GI: Bowel sounds are normal. He exhibits no distension.  Musculoskeletal: He exhibits no edemaor tenderness.  Neurological: He is alertand oriented.  Speech clear.  Follow commands without difficulty.  Mild ataxia RUE (stable) Motor: RUE/RLE grossly 4+/5 prox to distal (improving). LUE/LLE 5/5.  Skin: Skin is warmand dry. He is not diaphoretic.  Psychiatric: He has a normal mood and affect. His behavior is normal. Judgmentand thought contentnormal.   Assessment/Plan: 1. 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 Physiatrist is providing close team supervision and 24 hour management of active medical problems listed  below. Physiatrist and rehab team continue to assess barriers to discharge/monitor patient progress toward functional and medical goals.  Function:  Bathing Bathing position   Position: Shower  Bathing parts Body parts bathed by patient: Right arm, Left arm, Chest, Abdomen, Front perineal area, Buttocks, Right upper leg, Left upper leg, Right lower leg, Left lower leg Body parts bathed by helper: Back  Bathing assist Assist Level: Supervision or verbal cues      Upper Body Dressing/Undressing Upper body dressing   What is the patient wearing?: Pull over shirt/dress     Pull over shirt/dress - Perfomed by patient: Thread/unthread right sleeve, Thread/unthread left sleeve, Put head through opening, Pull shirt over trunk          Upper body assist Assist Level: Set up   Set up : To obtain clothing/put away  Lower Body Dressing/Undressing Lower body dressing   What is the patient wearing?: Pants, Non-skid slipper socks Underwear - Performed by patient: Thread/unthread right underwear leg, Thread/unthread left underwear leg, Pull underwear up/down   Pants- Performed by patient: Thread/unthread right pants leg, Thread/unthread left pants leg, Pull pants up/down   Non-skid slipper socks- Performed by patient: Don/doff right sock, Don/doff left sock   Socks - Performed by patient: Don/doff right sock, Don/doff left sock   Shoes - Performed by patient: Don/doff right shoe, Don/doff left shoe            Lower body assist Assist for lower body dressing: Set up, Supervision or verbal cues  Set up : To obtain clothing/put away  Toileting Toileting Toileting activity did not occur: No continent bowel/bladder event Toileting steps completed by patient: Adjust clothing prior to toileting, Performs perineal hygiene, Adjust clothing after toileting Toileting steps completed by helper: Adjust clothing prior to toileting, Performs perineal hygiene, Adjust clothing after toileting (per  Lawson Fiscal, NT report) Toileting Assistive Devices: Grab bar or rail  Toileting assist Assist level: Supervision or verbal cues   Transfers Chair/bed transfer   Chair/bed transfer method: Ambulatory Chair/bed transfer assist level: Supervision or verbal cues Chair/bed transfer assistive device: Armrests, Medical sales representative     Max distance: 150 Assist level: Touching or steadying assistance (Pt > 75%)   Wheelchair   Type: Manual Max wheelchair distance: 150' Assist Level: Supervision or verbal cues  Cognition Comprehension Comprehension assist level: Follows basic conversation/direction with no assist  Expression Expression assist level: Expresses basic needs/ideas: With extra time/assistive device  Social Interaction Social Interaction assist level: Interacts appropriately with others - No medications needed.  Problem Solving Problem solving assist level: Solves basic problems with no assist  Memory Memory assist level: Recognizes or recalls 90% of the time/requires cueing < 10% of the time    Medical Problem List and Plan: 1. Functional and mobility deficitssecondary to right cerebellar infarct  Cont CIR 2. DVT Prophylaxis/Anticoagulation: Pharmaceutical: Lovenox 3. Neuropaty/Pain Management: Continue gabapentin.  4. Mood: patient very motivated. LCSW to follow for evaluation and support.  5. Neuropsych: This patient iscapable of making decisions on hisown behalf. 6. Skin/Wound Care: routine pressure relief measures 7. Fluids/Electrolytes/Nutrition: Monitor I/Os 8. HTN: Monitor BP bid  Ziac, Accupril and Metoprolol PTA.   Lisinopril 5 daily started 9/11  Overall controlled 9/14 9. T2DM: Hgb A1c- 6.7. Monitor BS ac/hs. Continue to use SSI for elevated BS.   Resumed home meds, metformin 500 QID, glipizide TID on 9/11  Labile, but overall improving control in 9/14 10. Hypothyroid: On supplement 11. BPH: On proscar.  12.  Hypoalbuminemia  Supplement initiated 9/110 13. PAF  Started on Eliquis after discussion with Cards and Neuro on 9/11 14. ABLA  Hb 12.5 on 9/12  Cont to monitor  LOS (Days) 7 A FACE TO FACE EVALUATION WAS PERFORMED  Earl [name] 08/04/2017 9:38 AM

## 2017-08-04 NOTE — Progress Notes (Signed)
Occupational Therapy Session Note  Patient Details  Name: Earl [name] MRN: [id number] Date of Birth: 1941-09-10  Today's Date: 08/04/2017 OT Individual Time: 7915-0413 and 6438-3779 OT Individual Time Calculation (min): 47 min and 26 min   Short Term Goals: Week 1:  OT Short Term Goal 1 (Week 1): STG=LTG 2/2 ELOS  Skilled Therapeutic Interventions/Progress Updates:    1) Treatment session with focus on functional mobility, transfers, and dynamic standing balance during self-care tasks.  Pt completed breakfast seated at EOB with good use of RUE as dominant UE with no spillage of items.  Ambulated to bathroom with RW with supervision, min guard when navigating threshold into bathroom.  Completed toilet transfer and walk-in shower transfer with supervision with RW.  Pt required min assist sit > stand from tub bench post bathing due to height of surface and fatigue.  Demonstrating improved standing balance during bathing and LB dressing with alternating UE support to no support.  Left upright in w/c at sink to complete grooming tasks, passed off to RN.  2) Treatment session with focus on functional mobility and ambulation with RW.  Pt received in handoff from PT, reporting fatigue but expressing desire to work on walking.  Ambulated ~75' with RW with supervision while maneuvering obstacles with min cues.  Pt able to complete sidestepping with increased time and min cues for safety awareness.  Ambulated 150' with RW with supervision with focus on improved gait speed and awareness of obstacles during ambulation.  Returned to bed and left semi-reclined with all needs in reach.  Therapy Documentation Precautions:  Precautions Precautions: Fall Precaution Comments: dizziness Restrictions Weight Bearing Restrictions: No General:   Vital Signs: Therapy Vitals Temp: 99.4 F (37.4 C) Temp Source: Oral Pulse Rate: 60 Resp: 16 BP: 131/71 Patient Position (if appropriate): Lying Oxygen  Therapy SpO2: 99 % O2 Device: Not Delivered Pain:  Pt with no c/o pain  See Function Navigator for Current Functional Status.   Therapy/Group: Individual Therapy  Hezzie Karim, [place] 08/04/2017, 8:30 AM

## 2017-08-04 NOTE — Plan of Care (Signed)
Problem: RH SKIN INTEGRITY Goal: RH STG SKIN FREE OF INFECTION/BREAKDOWN Outcome: Progressing Skin clean, dry and intact, no infection noted.

## 2017-08-05 ENCOUNTER — Inpatient Hospital Stay (HOSPITAL_COMMUNITY): Payer: Medicare Other | Admitting: Occupational Therapy

## 2017-08-05 LAB — GLUCOSE, CAPILLARY
Glucose-Capillary: 110 mg/dL — ABNORMAL HIGH (ref 65–99)
Glucose-Capillary: 135 mg/dL — ABNORMAL HIGH (ref 65–99)
Glucose-Capillary: 112 mg/dL — ABNORMAL HIGH (ref 65–99)
Glucose-Capillary: 126 mg/dL — ABNORMAL HIGH (ref 65–99)

## 2017-08-05 NOTE — Progress Notes (Signed)
Occupational Therapy Weekly Progress Note  Patient Details  Name: Earl [name] MRN: [id number] Date of Birth: [birth year]  Beginning of progress report period: July 29, 2017 End of progress report period: August 05, 2017  Today's Date: 08/05/2017 OT Individual Time: 1030-1100 OT Individual Time Calculation (min): 30 min    Short term goals not set due to estimated length of stay.  Pt is making steady progress towards goals.  Pt currently supervision with all self-care tasks and mobility.  Requires intermittent cues for safety and sequencing with increased challenge, min assist in community setting.  Pt would benefit from family education prior to d/c home.  Patient continues to demonstrate the following deficits: muscle weakness, decreased cardiorespiratoy endurance, ataxia and decreased coordination, decreased visual motor skills, decreased midline orientation and decreased safety awareness and therefore will continue to benefit from skilled OT intervention to enhance overall performance with BADL and Reduce care partner burden.  See Patient's Care Plan for progression toward long term goals.  Patient progressing toward long term goals..  Continue plan of care.  Skilled Therapeutic Interventions/Progress Updates:    Treatment session with focus on increased independence and safety with self-care tasks.  Pt received supine in bed expressing desire to engage in bathing at shower level.  Pt gathered clothing with use of RW prior to ambulating to room shower.  Distant supervision with RW during mobility and transfers.  Pt completed bathing with setup to obtain items initially, noted improvements with standing balance during bathing.  Dressing completed at sit > stand level with distant supervision when standing to pull pants over hips.  Grooming completed in standing.  Pt with overall increased awareness and safety during mobility and self-care tasks this session.  Therapy  Documentation Precautions:  Precautions Precautions: Fall Precaution Comments: dizziness Restrictions Weight Bearing Restrictions: No General:   Vital Signs: Therapy Vitals Temp: 99 F (37.2 C) Temp Source: Oral Pulse Rate: 100 Resp: 18 BP: 125/77 Patient Position (if appropriate): Sitting Oxygen Therapy SpO2: 100 % O2 Device: Not Delivered Pain:   Pt with no c/o pain  See Function Navigator for Current Functional Status.   Therapy/Group: Individual Therapy  Simonne Come 08/05/2017, 7:26 AM

## 2017-08-05 NOTE — Progress Notes (Signed)
Earl [name]     PROGRESS NOTE  Subjective/Complaints:  No new issues overnight  ROS: Denies CP, SOB, N/V/D.  Objective: Vital Signs: Blood pressure 125/77, pulse 100, temperature 99 F (37.2 C), temperature source Oral, resp. rate 18, SpO2 100 %. No results found. No results for input(s): WBC, HGB, HCT, PLT in the last 72 hours. No results for input(s): NA, K, CL, GLUCOSE, BUN, CREATININE, CALCIUM in the last 72 hours.  Invalid input(s): CO CBG (last 3)   Recent Labs  08/04/17 2027 08/05/17 0621 08/05/17 1210  GLUCAP 135* 110* 135*    Wt Readings from Last 3 Encounters:  07/26/17 82.1 kg (181 lb 1.6 oz)  04/14/17 87.2 kg (192 lb 3.2 oz)  12/23/16 88.5 kg (195 lb)    Physical Exam:  BP 125/77 (BP Location: Left Arm)   Pulse 100   Temp 99 F (37.2 C) (Oral)   Resp 18   SpO2 100%  Constitutional: He appears well-developedand well-nourished. No distress.  HENT: Normocephalicand atraumatic.  Eyes: EOMare normal. No discharge.  Cardiovascular: RRR. No JVD. Respiratory: Effort normal and breath sounds normal.  GI: Bowel sounds are normal. He exhibits no distension.  Musculoskeletal: He exhibits no edemaor tenderness.  Neurological: He is alertand oriented.  Speech clear.  Follow commands without difficulty.  Mild ataxia RUE (stable) Motor: RUE/RLE grossly 4+/5 prox to distal (improving). LUE/LLE 5/5.  Skin: Skin is warmand dry. He is not diaphoretic.  Psychiatric: He has a normal mood and affect. His behavior is normal. Judgmentand thought contentnormal.   Assessment/Plan: 1. 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 Physiatrist is providing close team supervision and 24 hour management of active medical problems listed below. Physiatrist and rehab team continue to assess barriers to discharge/monitor  patient progress toward functional and medical goals.  Function:  Bathing Bathing position   Position: Shower  Bathing parts Body parts bathed by patient: Right arm, Left arm, Chest, Abdomen, Front perineal area, Buttocks, Right upper leg, Left upper leg, Right lower leg, Left lower leg, Back Body parts bathed by helper: Back  Bathing assist Assist Level: Set up      Upper Body Dressing/Undressing Upper body dressing   What is the patient wearing?: Pull over shirt/dress     Pull over shirt/dress - Perfomed by patient: Thread/unthread right sleeve, Thread/unthread left sleeve, Put head through opening, Pull shirt over trunk          Upper body assist Assist Level: More than reasonable time   Set up : To obtain clothing/put away  Lower Body Dressing/Undressing Lower body dressing   What is the patient wearing?: Pants, Non-skid slipper socks Underwear - Performed by patient: Thread/unthread right underwear leg, Thread/unthread left underwear leg, Pull underwear up/down   Pants- Performed by patient: Thread/unthread right pants leg, Thread/unthread left pants leg, Pull pants up/down   Non-skid slipper socks- Performed by patient: Don/doff right sock, Don/doff left sock   Socks - Performed by patient: Don/doff right sock, Don/doff left sock   Shoes - Performed by patient: Don/doff right shoe, Don/doff left shoe            Lower body assist Assist for lower body dressing: Supervision or verbal cues   Set up : To obtain clothing/put away  Toileting Toileting Toileting activity did not occur: No continent bowel/bladder event Toileting steps completed by patient: Adjust clothing prior to toileting,  Performs perineal hygiene, Adjust clothing after toileting Toileting steps completed by helper: Adjust clothing prior to toileting, Performs perineal hygiene, Adjust clothing after toileting (per Lawson Fiscal, NT report) Toileting Assistive Devices: Grab bar or rail  Toileting  assist Assist level: Supervision or verbal cues   Transfers Chair/bed transfer   Chair/bed transfer method: Ambulatory Chair/bed transfer assist level: Touching or steadying assistance (Pt > 75%) Chair/bed transfer assistive device: Armrests, Medical sales representative     Max distance: 150 Assist level: Touching or steadying assistance (Pt > 75%)   Wheelchair   Type: Manual Max wheelchair distance: 150' Assist Level: Supervision or verbal cues  Cognition Comprehension Comprehension assist level: Follows complex conversation/direction with no assist  Expression Expression assist level: Expresses complex ideas: With no assist  Social Interaction Social Interaction assist level: Interacts appropriately with others - No medications needed.  Problem Solving Problem solving assist level: Solves complex 90% of the time/cues < 10% of the time, Solves basic problems with no assist  Memory Memory assist level: Complete Independence: No helper    Medical Problem List and Plan: 1. Functional and mobility deficitssecondary to right cerebellar infarct  Cont CIR PT, OT 2. DVT Prophylaxis/Anticoagulation: Pharmaceutical: Lovenox 3. Neuropaty/Pain Management: Continue gabapentin.  4. Mood: patient very motivated. LCSW to follow for evaluation and support.  5. Neuropsych: This patient iscapable of making decisions on hisown behalf. 6. Skin/Wound Care: routine pressure relief measures 7. Fluids/Electrolytes/Nutrition: Monitor I/Os 8. HTN: Monitor BP bid  Ziac, Accupril and Metoprolol PTA.   Lisinopril 5 daily started 9/11  Controlled. 08/05/2017 Vitals:   08/04/17 1500 08/05/17 0426  BP: 127/72 125/77  Pulse: 96 100  Resp: 16 18  Temp: 98.6 F (37 C) 99 F (37.2 C)  SpO2: 100% 100%   9. T2DM: Hgb A1c- 6.7. Monitor BS ac/hs. Continue to use SSI for elevated BS.   Resumed home meds, metformin 500 QID, glipizide TID on 9/11   CBG (last 3)   Recent Labs   08/04/17 2027 08/05/17 0621 08/05/17 1210  GLUCAP 135* 110* 135*  Controlled on current regimen 08/05/2017  10. Hypothyroid: On supplement 11. BPH: On proscar.  12. Hypoalbuminemia  Supplement initiated 9/110 13. PAF  Started on Eliquis after discussion with Cards and Neuro on 9/11 14. ABLA  Hb 12.5 on 9/12  Cont to monitor  LOS (Days) 8 A FACE TO FACE EVALUATION WAS PERFORMED  Charlett Blake 08/05/2017 1:53 PM

## 2017-08-06 ENCOUNTER — Inpatient Hospital Stay (HOSPITAL_COMMUNITY): Payer: Medicare Other | Admitting: Occupational Therapy

## 2017-08-06 LAB — GLUCOSE, CAPILLARY
GLUCOSE-CAPILLARY: 61 mg/dL — AB (ref 65–99)
GLUCOSE-CAPILLARY: 67 mg/dL (ref 65–99)
GLUCOSE-CAPILLARY: 96 mg/dL (ref 65–99)
GLUCOSE-CAPILLARY: 180 mg/dL — AB (ref 65–99)
GLUCOSE-CAPILLARY: 62 mg/dL — AB (ref 65–99)
Glucose-Capillary: 119 mg/dL — ABNORMAL HIGH (ref 65–99)
Glucose-Capillary: 55 mg/dL — ABNORMAL LOW (ref 65–99)
Glucose-Capillary: 89 mg/dL (ref 65–99)

## 2017-08-06 MED ORDER — GLIPIZIDE 5 MG PO TABS
5.0000 mg | ORAL_TABLET | Freq: Two times a day (BID) | ORAL | Status: DC
  Administered 2017-08-06 – 2017-08-08 (×4): 5 mg via ORAL
  Filled 2017-08-06 (×4): qty 1

## 2017-08-06 NOTE — Progress Notes (Signed)
Occupational Therapy Session Note  Patient Details  Name: Earl [name] MRN: [id number] Date of Birth: February 22, 1941  Today's Date: 08/06/2017 OT Individual Time: 1003-1102 OT Individual Time Calculation (min): 59 min    Short Term Goals: Week 1:  OT Short Term Goal 1 (Week 1): STG=LTG 2/2 ELOS OT Short Term Goal 1 - Progress (Week 1): Progressing toward goal  Skilled Therapeutic Interventions/Progress Updates:    Tx focus on ADL retraining, Rt NMR, and walker safety during self care tasks.   Pt greeted supine in bed, agreeable to tx. He ambulated with RW with supervision to bathroom to void bladder, and then to therapy apartment. Pt transferred to shower chair via back up method with RW (practiced this 2x afterwards with emphasis on safe walker/hand placement. Also went over side step method. Back up method appeared to be safest option in terms of walker mgt). Pt attempting to doff gripper socks standing without device and required cues to sit. While in shower, pt bathed with supervision. Discussed shower situation at home. Per pt, he is no longer going to use shower with glass doors. Instead, he is using regular tub shower with 1 grab bar (already has shower chair). Pt practiced only using grab bar placed on Lt side of tub in prep for home. He dressed while seated EOB with supervision for safety due to impulsivity (i.e. bending low to retrieve items out of his reach). Simulated meal prep completed in kitchen with emphasis on walker safety. Issued him walker bag and he practiced retrieving items from fridge, cupboards, and pantries. Pt exhibiting education carryover of safe walker placement with supervision and min vcs. He then ambulated back to room and transferred to recliner. Pt left with all needs within reach at time of departure.   Therapy Documentation Precautions:  Precautions Precautions: Fall Precaution Comments: dizziness Restrictions Weight Bearing Restrictions: No Pain: Pain  Assessment Pain Assessment: No/denies pain ADL:      See Function Navigator for Current Functional Status.   Therapy/Group: Individual Therapy  Deeanna Beightol A Natilee Gauer 08/06/2017, 12:42 PM

## 2017-08-06 NOTE — Progress Notes (Signed)
Hypoglycemic Event  CBG: 67  Treatment: 15 GM carbohydrate snack   Symptoms: None  Follow-up CBG: Time: 7:20a CBG Result: 96  Possible Reasons for Event: Unknown  Comments/MD notified:Yes    Earl [name]

## 2017-08-06 NOTE — Progress Notes (Signed)
[place] PHYSICAL MEDICINE & REHABILITATION     PROGRESS NOTE  Subjective/Complaints:   Denies any bowel or bladder issues. Low-grade temp noted but no sweats or chills  ROS: Denies CP, SOB, N/V/D.  Objective: Vital Signs: Blood pressure 131/76, pulse 100, temperature 99.2 F (37.3 C), temperature source Oral, resp. rate 18, SpO2 100 %. No results found. No results for input(s): WBC, HGB, HCT, PLT in the last 72 hours. No results for input(s): NA, K, CL, GLUCOSE, BUN, CREATININE, CALCIUM in the last 72 hours.  Invalid input(s): CO CBG (last 3)   Recent Labs  08/06/17 0657 08/06/17 0720 08/06/17 1158  GLUCAP 61* 96 180*    Wt Readings from Last 3 Encounters:  07/26/17 82.1 kg (181 lb 1.6 oz)  04/14/17 87.2 kg (192 lb 3.2 oz)  12/23/16 88.5 kg (195 lb)    Physical Exam:  BP 131/76 (BP Location: Left Arm)   Pulse 100   Temp 99.2 F (37.3 C) (Oral)   Resp 18   SpO2 100%  Constitutional: He appears well-developedand well-nourished. No distress.  HENT: Normocephalicand atraumatic.  Eyes: EOMare normal. No discharge.  Cardiovascular: RRR. No JVD. Respiratory: Effort normal and breath sounds normal.  GI: Bowel sounds are normal. He exhibits no distension.  Musculoskeletal: He exhibits no edemaor tenderness.  Neurological: He is alertand oriented.  Speech clear.  Follow commands without difficulty.  Mild ataxia RUE (stable) Motor: RUE/RLE grossly 4+/5 prox to distal (improving). LUE/LLE 5/5.  Skin: Skin is warmand dry. He is not diaphoretic.  Psychiatric: He has a normal mood and affect. His behavior is normal. Judgmentand thought contentnormal.   Assessment/Plan: 1. 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 Physiatrist is providing close team supervision and 24 hour management of active medical problems listed below. Physiatrist and  rehab team continue to assess barriers to discharge/monitor patient progress toward functional and medical goals.  Function:  Bathing Bathing position   Position: Shower  Bathing parts Body parts bathed by patient: Right arm, Left arm, Chest, Abdomen, Front perineal area, Buttocks, Right upper leg, Left upper leg, Right lower leg, Left lower leg, Back Body parts bathed by helper: Back  Bathing assist Assist Level: Set up      Upper Body Dressing/Undressing Upper body dressing   What is the patient wearing?: Pull over shirt/dress     Pull over shirt/dress - Perfomed by patient: Thread/unthread right sleeve, Thread/unthread left sleeve, Put head through opening, Pull shirt over trunk          Upper body assist Assist Level: More than reasonable time   Set up : To obtain clothing/put away  Lower Body Dressing/Undressing Lower body dressing   What is the patient wearing?: Pants, Non-skid slipper socks Underwear - Performed by patient: Thread/unthread right underwear leg, Thread/unthread left underwear leg, Pull underwear up/down   Pants- Performed by patient: Thread/unthread right pants leg, Thread/unthread left pants leg, Pull pants up/down   Non-skid slipper socks- Performed by patient: Don/doff right sock, Don/doff left sock   Socks - Performed by patient: Don/doff right sock, Don/doff left sock   Shoes - Performed by patient: Don/doff right shoe, Don/doff left shoe            Lower body assist Assist for lower body dressing: Supervision or verbal cues   Set up : To obtain clothing/put away  Toileting Toileting Toileting activity did not occur: No continent bowel/bladder  event Toileting steps completed by patient: Adjust clothing prior to toileting, Performs perineal hygiene, Adjust clothing after toileting Toileting steps completed by helper: Adjust clothing prior to toileting, Performs perineal hygiene, Adjust clothing after toileting Toileting Assistive Devices:  Grab bar or rail  Toileting assist Assist level: Supervision or verbal cues   Transfers Chair/bed transfer   Chair/bed transfer method: Ambulatory Chair/bed transfer assist level: Touching or steadying assistance (Pt > 75%) Chair/bed transfer assistive device: Armrests, Medical sales representative     Max distance: 150 Assist level: Touching or steadying assistance (Pt > 75%)   Wheelchair   Type: Manual Max wheelchair distance: 150' Assist Level: Supervision or verbal cues  Cognition Comprehension Comprehension assist level: Follows complex conversation/direction with no assist  Expression Expression assist level: Expresses complex ideas: With no assist  Social Interaction Social Interaction assist level: Interacts appropriately with others - No medications needed.  Problem Solving Problem solving assist level: Solves complex 90% of the time/cues < 10% of the time  Memory Memory assist level: Complete Independence: No helper    Medical Problem List and Plan: 1. Functional and mobility deficitssecondary to right cerebellar infarct  Cont CIR PT, OT 2. DVT Prophylaxis/Anticoagulation: Pharmaceutical: Lovenox 3. Neuropaty/Pain Management: Continue gabapentin.  4. Mood: patient very motivated. LCSW to follow for evaluation and support.  5. Neuropsych: This patient iscapable of making decisions on hisown behalf. 6. Skin/Wound Care: routine pressure relief measures 7. Fluids/Electrolytes/Nutrition: Monitor I/Os 8. HTN: Monitor BP bid  Ziac, Accupril and Metoprolol PTA.   Lisinopril 5 daily started 9/11  Controlled. 08/06/2017 Vitals:   08/05/17 1630 08/06/17 0623  BP: 129/81 131/76  Pulse: 100 100  Resp: 19 18  Temp: 98.3 F (36.8 C) 99.2 F (37.3 C)  SpO2: 100% 100%   9. T2DM: Hgb A1c- 6.7. Monitor BS ac/hs. Continue to use SSI for elevated BS.   Resumed home meds, metformin 500 QID, glipizide TID on 9/11,Hypoglycemic episode noted, will reduce glipizide  10 twice a day   CBG (last 3)   Recent Labs  08/06/17 0657 08/06/17 0720 08/06/17 1158  GLUCAP 61* 96 180*    10. Hypothyroid: On supplement 11. BPH: On proscar.  12. Hypoalbuminemia  Supplement initiated 9/110 13. PAF  Started on Eliquis after discussion with Cards and Neuro on 9/11 14. ABLA  Hb 12.5 on 9/12  Cont to monitor  LOS (Days) 9 A [place] EVALUATION WAS PERFORMED  Charlett Blake 08/06/2017 12:34 PM

## 2017-08-06 NOTE — H&P (Signed)
Hypoglycemic Event  CBG: 55  Treatment: 15 GM carbohydrate snack  Symptoms: None  Follow-up CBG: Time: 2234 CBG Result: 89  Possible Reasons for Event: Unknown  Comments/MD notified:Yes Kirstein, can hold metformin and insulin novolog    [employer]

## 2017-08-07 ENCOUNTER — Inpatient Hospital Stay (HOSPITAL_COMMUNITY): Payer: Medicare Other | Admitting: Physical Therapy

## 2017-08-07 ENCOUNTER — Inpatient Hospital Stay (HOSPITAL_COMMUNITY): Payer: Medicare Other | Admitting: Occupational Therapy

## 2017-08-07 ENCOUNTER — Ambulatory Visit (HOSPITAL_COMMUNITY): Payer: Medicare Other | Admitting: Physical Therapy

## 2017-08-07 LAB — GLUCOSE, CAPILLARY
GLUCOSE-CAPILLARY: 162 mg/dL — AB (ref 65–99)
Glucose-Capillary: 123 mg/dL — ABNORMAL HIGH (ref 65–99)
Glucose-Capillary: 115 mg/dL — ABNORMAL HIGH (ref 65–99)
Glucose-Capillary: 121 mg/dL — ABNORMAL HIGH (ref 65–99)

## 2017-08-07 NOTE — Progress Notes (Signed)
Physical Therapy Session Note  Patient Details  Name: Earl [name] MRN: [id number] Date of Birth: [birth year]  Today's Date: 08/07/2017 PT Individual Time: 8657-8469 PT Individual Time Calculation (min): 26 min   Short Term Goals: Week 1:  PT Short Term Goal 1 (Week 1): =LTGs due to ELOS  Skilled Therapeutic Interventions/Progress Updates:  Pt received in recliner & agreeable to tx, denying c/o pain. Pt completes sit<>stand transfers with supervision overall. Pt ambulates room<>dayroom with RW & supervision with increasing forward lean as task progressed but pt able to recognize and self correct with only occasional cuing. Pt utilized nu-step on level 3 x 12 minutes with all 4 extremities with task focusing on coordination of reciprocal movements and endurance training. At end of session pt left sitting in recliner in room with all needs within reach.   Therapy Documentation Precautions:  Precautions Precautions: Fall Precaution Comments: dizziness Restrictions Weight Bearing Restrictions: No   See Function Navigator for Current Functional Status.   Therapy/Group: Individual Therapy  Waunita Schooner 08/07/2017, 12:11 PM

## 2017-08-07 NOTE — Progress Notes (Signed)
Physical Therapy Session Note  Patient Details  Name: Earl [name] MRN: [id number] Date of Birth: 12/06/1940  Today's Date: 08/07/2017 PT Individual Time: 0830-0926 PT Individual Time Calculation (min): 56 min    Skilled Therapeutic Interventions/Progress Updates:    pt was in high spirits this morning and very agreeable to therapy. Pt was able to get out of bed to RW I with supervision. Pt. ambulated with RW I with supervision to rehab gym with minimal cueing of step through gait and erect posture.while ambulating, pt was asked to look in random patterns (L/R/ Up/Down) to work on walking in more distracting and crowded environments. Pt only lost balance once when asked to look left, but quickly corrected his self.  Pt was able to perform stairs I with supervision, as long as hand rail on R side is present. Pt was able to perform stairs min A w/o handrail. Pt performed getting in and out of bed and car I with supervision to demonstrate ADL capabilities. Pt performed obstacle gait training of 20 ft x 4 including weaving and side shuffling. Pt ambulated back to room with RW and was left in recliner with call bell.  Pt strength was tested while sitting R/L hip flexion 4/5. R/L knee flex/ext 4/5 R/L dorsiflexion 5/5,  plantar flexion R 4/5 L 5/5  Therapy Documentation Precautions:  Precautions Precautions: Fall Precaution Comments: dizziness Restrictions Weight Bearing Restrictions: No General:  Pain: Pain Assessment Pain Assessment: No/denies pain    Therapy/Group: Individual Therapy  Steffi Noviello 08/07/2017, 11:26 AM

## 2017-08-07 NOTE — Progress Notes (Signed)
Occupational Therapy Discharge Summary  Patient Details  Name: Earl [name] MRN: [id number] Date of Birth: 17-Jun-1941  Today's Date: 08/07/2017 OT Individual Time: 1400-1459 OT Individual Time Calculation (min): 59 min    Patient has met 12 of 12 long term goals due to improved activity tolerance, improved balance, postural control, ability to compensate for deficits and improved coordination.  Patient to discharge at overall supervision - mod I level.  Patient's care partner is independent to provide the necessary physical assistance at discharge.    Reasons goals not met: all goals met  Recommendation:  Patient will benefit from ongoing skilled OT services in outpatient setting to continue to advance functional skills in the area of BADL and iADL.  Equipment: TTB and 3 in 1 commode chair  Reasons for discharge: treatment goals met  Patient/family agrees with progress made and goals achieved: Yes   OT Intervention: Upon entering the room, pt with no c/o pain and his wife and daughter present for family education regarding discharge tomorrow. Pt declined bathing and dressing this session and caregiver states, " He can do it himself now." Pt ambulating to ADL apartment with RW and supervision provided by wife. OT reviewed transfer onto TTB with use of RW and pt returned demonstration with supervision. OT also recommended purchase of safety treads for safety to decrease fall risk. Pt returned to room in same manner. Caregivers having several questions regarding outpatient services and secondary stroke risk education reviewed. All questions answered and pt remained seated in recliner chair at end of session. Call bell and all needed items within reach.   OT Discharge Precautions/Restrictions  Precautions Precautions: Fall General   Vital Signs Therapy Vitals Temp: 98.1 F (36.7 C) Temp Source: Oral Pulse Rate: 77 Resp: 20 BP: 111/63 Patient Position (if appropriate):  Sitting Oxygen Therapy SpO2: 97 % O2 Device: Not Delivered Pain Pain Assessment Pain Assessment: No/denies pain ADL   Vision Baseline Vision/History: Wears glasses Wears Glasses: Reading only Perception    Praxis   Cognition Overall Cognitive Status: Within Functional Limits for tasks assessed Arousal/Alertness: Awake/alert Orientation Level: Oriented X4 Sensation Sensation Light Touch: Appears Intact Coordination Gross Motor Movements are Fluid and Coordinated: No Fine Motor Movements are Fluid and Coordinated: No Coordination and Movement Description: mild ataxia of LEs and trunk Motor  Motor Motor: Hemiplegia;Ataxia Motor - Skilled Clinical Observations: Mild R hemiplegia, mild ataxia w/ all movements Motor - Discharge Observations: mild ataxia, mild Rt hemiplegia Mobility     Trunk/Postural Assessment  Cervical Assessment Cervical Assessment: Within Functional Limits Thoracic Assessment Thoracic Assessment: Within Functional Limits Lumbar Assessment Lumbar Assessment: Within Functional Limits Postural Control Postural Control: Deficits on evaluation Righting Reactions: delayed  Balance   Extremity/Trunk Assessment RUE Assessment RUE Assessment: Within Functional Limits LUE Assessment LUE Assessment: Within Functional Limits   See Function Navigator for Current Functional Status.  Darleen Crocker P 08/07/2017, 5:09 PM

## 2017-08-07 NOTE — Progress Notes (Signed)
Physical Therapy Discharge Summary  Patient Details  Name: Earl [name] MRN: [id number] Date of Birth: [birth year]  Time: 1300-1341 41 minutes  1:1 No c/o pain. Session focused on pt/family education with pt and his wife.  Pt's wife performed min A for stair negotiation without railing. Pt demo'd and wife provided supervision for curb step negotiation, gait in home and controlled environments, car transfer and floor transfer. Pt and wife state they feel ready for d/c home tomorrow.  Patient has met 8 of 8 long term goals due to improved activity tolerance, improved balance, improved postural control, increased strength and ability to compensate for deficits.  Patient to discharge at an ambulatory level Supervision.   Patient's care partner is independent to provide the necessary supervision assistance at discharge.  Reasons goals not met: n/a  Recommendation:  Patient will benefit from ongoing skilled PT services in home health setting to continue to advance safe functional mobility, address ongoing impairments in gait, balance, coordination, and minimize fall risk.  Equipment: RW, w/c  Reasons for discharge: treatment goals met and discharge from hospital  Patient/family agrees with progress made and goals achieved: Yes  PT Discharge Precautions/Restrictions Precautions Precautions: Fall Restrictions Weight Bearing Restrictions: No Pain Pain Assessment Pain Assessment: No/denies pain  Cognition Overall Cognitive Status: Within Functional Limits for tasks assessed Arousal/Alertness: Awake/alert Sensation Sensation Light Touch: Appears Intact Proprioception: Appears Intact Coordination Gross Motor Movements are Fluid and Coordinated: No Fine Motor Movements are Fluid and Coordinated: No Coordination and Movement Description: mild ataxia of LEs and trunk Motor  Motor Motor: Hemiplegia;Ataxia Motor - Skilled Clinical Observations: Mild R hemiplegia, mild ataxia w/ all  movements Motor - Discharge Observations: mild ataxia, mild Rt hemiplegia    Trunk/Postural Assessment  Cervical Assessment Cervical Assessment: Within Functional Limits Thoracic Assessment Thoracic Assessment: Within Functional Limits Lumbar Assessment Lumbar Assessment: Within Functional Limits Postural Control Postural Control: Deficits on evaluation Righting Reactions: delayed  Balance Static Sitting Balance Static Sitting - Level of Assistance: 7: Independent Dynamic Sitting Balance Dynamic Sitting - Level of Assistance: 6: Modified independent (Device/Increase time) Static Standing Balance Static Standing - Level of Assistance: 5: Stand by assistance Dynamic Standing Balance Dynamic Standing - Level of Assistance: 5: Stand by assistance Extremity Assessment      RLE Assessment RLE Assessment:  (grossly 4/5) LLE Assessment LLE Assessment:  (grossly 4/5)   See Function Navigator for Current Functional Status.  Earl [name] 08/07/2017, 10:44 AM

## 2017-08-07 NOTE — Progress Notes (Signed)
[place] PHYSICAL MEDICINE & REHABILITATION     PROGRESS NOTE  Subjective/Complaints:  Pt seen laying in bed this AM. He slept well overnight, had a good weekend and is looking forward to d/c tomorrow.    ROS: Denies CP, SOB, N/V/D.  Objective: Vital Signs: Blood pressure (!) 146/83, pulse (!) 51, temperature 99 F (37.2 C), temperature source Oral, resp. rate 18, SpO2 99 %. No results found. No results for input(s): WBC, HGB, HCT, PLT in the last 72 hours. No results for input(s): NA, K, CL, GLUCOSE, BUN, CREATININE, CALCIUM in the last 72 hours.  Invalid input(s): CO CBG (last 3)   Recent Labs  08/06/17 2157 08/06/17 2234 08/07/17 0617  GLUCAP 62* 89 162*    Wt Readings from Last 3 Encounters:  07/26/17 82.1 kg (181 lb 1.6 oz)  04/14/17 87.2 kg (192 lb 3.2 oz)  12/23/16 88.5 kg (195 lb)    Physical Exam:  BP (!) 146/83 (BP Location: Left Arm)   Pulse (!) 51   Temp 99 F (37.2 C) (Oral)   Resp 18   SpO2 99%  Constitutional: He appears well-developedand well-nourished. No distress.  HENT: Normocephalicand atraumatic.  Eyes: EOMare normal. No discharge.  Cardiovascular: RRR. No JVD. Respiratory: Effort normal and breath sounds normal.  GI: Bowel sounds are normal. He exhibits no distension.  Musculoskeletal: He exhibits no edemaor tenderness.  Neurological: He is alertand oriented.  Speech clear.  Follow commands without difficulty.  Mild ataxia RUE (unchanged) Motor: RUE/RLE grossly 4+/5 prox to distal (improving). LUE/LLE 5/5.  Skin: Skin is warmand dry. He is not diaphoretic.  Psychiatric: He has a normal mood and affect. His behavior is normal. Judgmentand thought contentnormal.   Assessment/Plan: 1. 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 Physiatrist is providing close team supervision and 24 hour management of active medical  problems listed below. Physiatrist and rehab team continue to assess barriers to discharge/monitor patient progress toward functional and medical goals.  Function:  Bathing Bathing position   Position: Shower  Bathing parts Body parts bathed by patient: Right arm, Left arm, Chest, Abdomen, Front perineal area, Buttocks, Right upper leg, Left upper leg, Right lower leg, Left lower leg, Back Body parts bathed by helper: Back  Bathing assist Assist Level: Supervision or verbal cues      Upper Body Dressing/Undressing Upper body dressing   What is the patient wearing?: Pull over shirt/dress     Pull over shirt/dress - Perfomed by patient: Thread/unthread right sleeve, Thread/unthread left sleeve, Put head through opening, Pull shirt over trunk          Upper body assist Assist Level: More than reasonable time   Set up : To obtain clothing/put away  Lower Body Dressing/Undressing Lower body dressing   What is the patient wearing?: Pants, Non-skid slipper socks, Underwear Underwear - Performed by patient: Thread/unthread right underwear leg, Thread/unthread left underwear leg, Pull underwear up/down   Pants- Performed by patient: Thread/unthread right pants leg, Thread/unthread left pants leg, Pull pants up/down   Non-skid slipper socks- Performed by patient: Don/doff right sock, Don/doff left sock   Socks - Performed by patient: Don/doff right sock, Don/doff left sock   Shoes - Performed by patient: Don/doff right shoe, Don/doff left shoe            Lower body assist Assist for lower body dressing: Supervision or verbal cues   Set up :  To obtain clothing/put away  Toileting Toileting Toileting activity did not occur: No continent bowel/bladder event Toileting steps completed by patient: Adjust clothing prior to toileting, Performs perineal hygiene, Adjust clothing after toileting Toileting steps completed by helper: Adjust clothing prior to toileting, Performs perineal  hygiene, Adjust clothing after toileting Toileting Assistive Devices: Grab bar or rail  Toileting assist Assist level: Supervision or verbal cues   Transfers Chair/bed transfer   Chair/bed transfer method: Ambulatory Chair/bed transfer assist level: Touching or steadying assistance (Pt > 75%) Chair/bed transfer assistive device: Armrests, Medical sales representative     Max distance: 150 Assist level: Touching or steadying assistance (Pt > 75%)   Wheelchair   Type: Manual Max wheelchair distance: 150' Assist Level: Supervision or verbal cues  Cognition Comprehension Comprehension assist level: Follows complex conversation/direction with no assist  Expression Expression assist level: Expresses complex ideas: With no assist  Social Interaction Social Interaction assist level: Interacts appropriately with others - No medications needed.  Problem Solving Problem solving assist level: Solves complex 90% of the time/cues < 10% of the time  Memory Memory assist level: Complete Independence: No helper    Medical Problem List and Plan: 1. Functional and mobility deficitssecondary to right cerebellar infarct  Cont CIR 2. DVT Prophylaxis/Anticoagulation: Pharmaceutical: Lovenox 3. Neuropaty/Pain Management: Continue gabapentin.  4. Mood: patient very motivated. LCSW to follow for evaluation and support.  5. Neuropsych: This patient iscapable of making decisions on hisown behalf. 6. Skin/Wound Care: routine pressure relief measures 7. Fluids/Electrolytes/Nutrition: Monitor I/Os 8. HTN: Monitor BP bid  Ziac, Accupril and Metoprolol PTA.   Lisinopril 5 daily started 9/11  Slightly elevated today, otherwise controlled Vitals:   08/06/17 1534 08/07/17 0525  BP: (!) 151/73 (!) 146/83  Pulse: 99 (!) 51  Resp: 18 18  Temp: 98.5 F (36.9 C) 99 F (37.2 C)  SpO2: 98% 99%   9. T2DM: Hgb A1c- 6.7. Monitor BS ac/hs. Continue to use SSI for elevated BS.   Resumed home  meds, metformin 500 QID, glipizide TID on 9/11   CBG (last 3)   Recent Labs  08/06/17 2157 08/06/17 2234 08/07/17 0617  GLUCAP 62* 89 162*   Labile, cont to monitor 10. Hypothyroid: On supplement 11. BPH: On proscar.  12. Hypoalbuminemia  Supplement initiated 9/110 13. PAF  Started on Eliquis after discussion with Cards and Neuro on 9/11 14. ABLA  Hb 12.5 on 9/12  Cont to monitor  LOS (Days) 10 A FACE TO FACE EVALUATION WAS PERFORMED  Ankit Lorie Phenix 08/07/2017 9:35 AM

## 2017-08-08 LAB — GLUCOSE, CAPILLARY: Glucose-Capillary: 121 mg/dL — ABNORMAL HIGH (ref 65–99)

## 2017-08-08 MED ORDER — MECLIZINE HCL 25 MG PO TABS: 25.0000 mg | ORAL_TABLET | Freq: Three times a day (TID) | ORAL | 0 refills | Status: DC

## 2017-08-08 MED ORDER — LISINOPRIL 5 MG PO TABS: 5.0000 mg | ORAL_TABLET | Freq: Every day | ORAL | 0 refills | Status: DC

## 2017-08-08 MED ORDER — SENNOSIDES-DOCUSATE SODIUM 8.6-50 MG PO TABS: 2.0000 | ORAL_TABLET | Freq: Every day | ORAL | 0 refills | Status: DC

## 2017-08-08 MED ORDER — GLIPIZIDE 5 MG PO TABS: 5.0000 mg | ORAL_TABLET | Freq: Two times a day (BID) | ORAL | 1 refills | Status: DC

## 2017-08-08 MED ORDER — ATORVASTATIN CALCIUM 40 MG PO TABS: 40.0000 mg | ORAL_TABLET | Freq: Every day | ORAL | 0 refills | Status: DC

## 2017-08-08 MED ORDER — APIXABAN 5 MG PO TABS: 5.0000 mg | ORAL_TABLET | Freq: Two times a day (BID) | ORAL | 0 refills | Status: DC

## 2017-08-08 NOTE — Discharge Summary (Signed)
Physician Discharge Summary  Patient ID: Earl [name] MRN: [id number] DOB/AGE: [birth year] 76 y.o.  Admit date: 07/28/2017 Discharge date: 08/08/2017  Discharge Diagnoses:  Principal Problem:   Cerebellar stroke [facility]) Active Problems:   Diabetes mellitus ([place])   Benign essential HTN   Ataxia, post-stroke   PAF (paroxysmal atrial fibrillation) ([facility])   Acute blood loss anemia   Discharged Condition: stable   Significant Diagnostic Studies: N/A   Labs:  Basic Metabolic Panel: BMP Latest Ref Rng & Units 08/02/2017 07/29/2017 07/28/2017  Glucose 65 - 99 mg/dL 159(H) 254(H) 281(H)  BUN 6 - 20 mg/dL 15 11 11   Creatinine 0.61 - 1.24 mg/dL 1.13 1.23 1.09  Sodium 135 - 145 mmol/L 138 137 137  Potassium 3.5 - 5.1 mmol/L 4.3 3.9 4.1  Chloride 101 - 111 mmol/L 108 104 105  CO2 22 - 32 mmol/L 23 23 22   Calcium 8.9 - 10.3 mg/dL 8.5(L) 8.7(L) 8.6(L)    CBC: BMP Latest Ref Rng & Units 08/02/2017 07/29/2017 07/28/2017  Glucose 65 - 99 mg/dL 159(H) 254(H) 281(H)  BUN 6 - 20 mg/dL 15 11 11   Creatinine 0.61 - 1.24 mg/dL 1.13 1.23 1.09  Sodium 135 - 145 mmol/L 138 137 137  Potassium 3.5 - 5.1 mmol/L 4.3 3.9 4.1  Chloride 101 - 111 mmol/L 108 104 105  CO2 22 - 32 mmol/L 23 23 22   Calcium 8.9 - 10.3 mg/dL 8.5(L) 8.7(L) 8.6(L)    CBG:  Recent Labs Lab 08/07/17 0617 08/07/17 1128 08/07/17 1620 08/07/17 2108 08/08/17 0622  GLUCAP 162* 123* 115* 121* 121*    Today's Vitals   08/07/17 1354 08/07/17 2130 08/07/17 2150 08/08/17 0411  BP: 111/63   132/73  Pulse: 77   95  Resp: 20   18  Temp: 98.1 F (36.7 C)   98.8 F (37.1 C)  TempSrc: Oral   Oral  SpO2: 97%   100%  PainSc:  0-No pain 0-No pain     Brief HPI:   Earl Tollisonis a 76 y.o.malewith history of HTN, questionable history PAF, T2DM; who was admitted on 07/25/17 with dizziness, nausea and right sided weakness. History taken from chart review and patient. CT head reviewed, showing acute superior right cerebellar  infarct. CTA head/neck done revealing right AICA late acute/early subacute infarct, no vascular abnormality. TEE revealed EF 55-60% and negative for SOE.  Loop recorder placed on 9/7 and patient placed on ASA for secondary stroke prophylaxis.  Therapy ongoing with limitations in mobility and self care tasks. CIR recommended for follow up therapy.  Hospital Course: Earl [name] was admitted to rehab 07/28/2017 for inpatient therapies to consist of PT and OT at least three hours five days a week. Past admission physiatrist, therapy team and rehab RN have worked together to provide customized collaborative inpatient rehab. Dizziness managed with schedule meclizine and has resolved. On 9/11, device clinic from cardiology called to relay that loop showed episode of PAF and Dr. Caryl Comes felt that anticoagulation should be initiated. Case discussed with Dr. Erlinda Hong who felt that it was safe to initiate full dose anticoagulation as no signs of hemorrhagic conversion. Loop recorder insertion site is healing well without s/s of infection.  Diabetes has been monitored on achs basis and BS were noted to be poorly controlled. Metformin was titrated to home regimen and glipizide was resumed with improvement in BS control. Blood pressures were monitored on bid basis and low dose lisinopril was added for control. His balance is improving with decrease in ataxia. He  has made steady progress and is at supervision level. He will continue to receive outpatient PT/OT at [facility] after discharge.   Rehab course: During patient's stay in rehab weekly team conference was held to monitor patient's progress, set goals and discuss barriers to discharge. At admission, patient required min assist with ADLs and mobility. He has had improvement in activity tolerance, balance, postural control, as well as ability to compensate for deficits. He has had improvement in functional use RUE  and RLE as well as improved awareness. He is able to  complete ADL tasks with at modified independent to supervision level.  He requires supervision for transfers and to ambulate 120' with RW. Family education was completed regarding all aspects of mobility and cues for safety.    Disposition: 01-Home or Self Care  Diet: Heart healthy/Diabetic.   Special Instructions: 1. Monitor BS bid for now. 2. No driving or strenuous activity.  3. Needs supervision with mobility.   Discharge Instructions    Ambulatory referral to Physical Medicine Rehab    Complete by:  As directed    1-2 weeks  Transitional care appt     Discharge Medication List as of 08/08/2017 10:39 AM    START taking these medications   Details  apixaban (ELIQUIS) 5 MG TABS tablet Take 1 tablet (5 mg total) by mouth 2 (two) times daily., Starting Tue 08/08/2017, Normal    atorvastatin (LIPITOR) 40 MG tablet Take 1 tablet (40 mg total) by mouth daily at 6 PM., Starting Tue 08/08/2017, Normal    lisinopril (PRINIVIL,ZESTRIL) 5 MG tablet Take 1 tablet (5 mg total) by mouth daily., Starting Wed 08/09/2017, Normal    meclizine (ANTIVERT) 25 MG tablet Take 1 tablet (25 mg total) by mouth 3 (three) times daily. For dizziness--can wean to as needed, Starting Tue 08/08/2017, Normal    senna-docusate (SENOKOT-S) 8.6-50 MG tablet Take 2 tablets by mouth at bedtime., Starting Tue 08/08/2017, Normal      CONTINUE these medications which have CHANGED   Details  glipiZIDE (GLUCOTROL) 5 MG tablet Take 1 tablet (5 mg total) by mouth 2 (two) times daily before a meal., Starting Tue 08/08/2017, No Print      CONTINUE these medications which have NOT CHANGED   Details  Cholecalciferol (VITAMIN D PO) Take 5,000 Units by mouth daily., Historical Med    Cyanocobalamin (B-12) 50 MCG TABS Take 50 mcg by mouth daily., Historical Med    finasteride (PROSCAR) 5 MG tablet TAKE 1 TABLET BY MOUTH  EVERY DAY FOR PROSTATE, Normal    gabapentin (NEURONTIN) 300 MG capsule TAKE 1 TO 2 CAPSULES BY  MOUTH AT  NIGHT FOR PAIN, Normal    levothyroxine (SYNTHROID, LEVOTHROID) 50 MCG tablet TAKE 1 TABLET BY MOUTH  DAILY BEFORE BREAKFAST, Normal    Magnesium 250 MG TABS Take 250 mg by mouth daily., Historical Med    metFORMIN (GLUCOPHAGE-XR) 500 MG 24 hr tablet TAKE 1 TABLET BY MOUTH 4  TIMES DAILY FOR DIABETES, Normal    Multiple Vitamin (MULTIVITAMIN) capsule Take 1 capsule by mouth daily.  , Historical Med    vitamin C (ASCORBIC ACID) 500 MG tablet Take 500 mg by mouth daily.  , Historical Med      STOP taking these medications     aspirin 81 MG tablet      atenolol (TENORMIN) 100 MG tablet      bisoprolol-hydrochlorothiazide (ZIAC) 10-6.25 MG tablet      potassium chloride (KLOR-CON) 10 MEQ CR  tablet      quinapril (ACCUPRIL) 40 MG tablet      simvastatin (ZOCOR) 40 MG tablet        Follow-up Information    [name], MD Follow up on 08/15/2017.   Specialty:  Internal Medicine Why:  @ 4:00 pm (hospital follow up appt) Contact information: [address] [place] [address] [phone]        Jamse Arn, MD Follow up.   Specialty:  Physical Medicine and Rehabilitation Why:  office will call you with follow up appointment Contact information: [address] [place] [address] [phone]        Rosalin Hawking, MD. Call in 1 day(s).   Specialty:  Neurology Why:  for follow up appointment Contact information: [address] [place] [address] [phone]           Signed: Bary Leriche 08/08/2017, 5:02 PM

## 2017-08-08 NOTE — Progress Notes (Signed)
Recreational Therapy Discharge Summary Patient Details  Name: Earl [name] MRN: [id number] Date of Birth: 06/09/1941 Today's Date: 08/08/2017  Comments on progress toward goals: Pt has made great progress during LOS and is ready for discharge home with family to provide 24 hour supervision/assist.  TR session focused on safe community mobility and education on energy conservation techniques. Pt is discharging home at Min assist level for community mobility.  See outing goal sheet in shadow chart for full details.  All goals met.  Reasons for discharge: discharge from hospital  Patient/family agrees with progress made and goals achieved: Yes  SIMPSON,LISA 08/08/2017, 10:23 AM     

## 2017-08-08 NOTE — Progress Notes (Signed)
[facility] PHYSICAL MEDICINE & REHABILITATION     PROGRESS NOTE  Subjective/Complaints:  Pt seen laying in bed this AM.  He slept well overnight and he is ready to go home.  He has questions regarding his loop recorder.    ROS: Denies CP, SOB, N/V/D.  Objective: Vital Signs: Blood pressure 132/73, pulse 95, temperature 98.8 F (37.1 C), temperature source Oral, resp. rate 18, SpO2 100 %. No results found. No results for input(s): WBC, HGB, HCT, PLT in the last 72 hours. No results for input(s): NA, K, CL, GLUCOSE, BUN, CREATININE, CALCIUM in the last 72 hours.  Invalid input(s): CO CBG (last 3)   Recent Labs  08/07/17 1620 08/07/17 2108 08/08/17 0622  GLUCAP 115* 121* 121*    Wt Readings from Last 3 Encounters:  07/26/17 82.1 kg (181 lb 1.6 oz)  04/14/17 87.2 kg (192 lb 3.2 oz)  12/23/16 88.5 kg (195 lb)    Physical Exam:  BP 132/73 (BP Location: Left Arm)   Pulse 95   Temp 98.8 F (37.1 C) (Oral)   Resp 18   SpO2 100%  Constitutional: He appears well-developedand well-nourished. No distress.  HENT: Normocephalicand atraumatic.  Eyes: EOMare normal. No discharge.  Cardiovascular: RRR. No JVD. Respiratory: Effort normal and breath sounds normal.  GI: Bowel sounds are normal. He exhibits no distension.  Musculoskeletal: He exhibits no edemaor tenderness.  Neurological: He is alertand oriented.  Speech clear.  Follow commands without difficulty.  Mild ataxia RUE (stable) Motor: RUE/RLE grossly 4+/5 prox to distal (stable). LUE/LLE 5/5.  Skin: Skin is warmand dry. He is not diaphoretic.  Psychiatric: He has a normal mood and affect. His behavior is normal. Judgmentand thought contentnormal.   Assessment/Plan: 1. 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 Physiatrist is providing close team supervision and 24 hour management of active  medical problems listed below. Physiatrist and rehab team continue to assess barriers to discharge/monitor patient progress toward functional and medical goals.  Function:  Bathing Bathing position   Position: Shower  Bathing parts Body parts bathed by patient: Right arm, Left arm, Chest, Abdomen, Front perineal area, Buttocks, Right upper leg, Left upper leg, Right lower leg, Left lower leg, Back (per caregiver/pt report) Body parts bathed by helper: Back  Bathing assist Assist Level: Supervision or verbal cues      Upper Body Dressing/Undressing Upper body dressing   What is the patient wearing?: Pull over shirt/dress     Pull over shirt/dress - Perfomed by patient: Thread/unthread right sleeve, Thread/unthread left sleeve, Put head through opening, Pull shirt over trunk (per pt/caregiver report)          Upper body assist Assist Level: More than reasonable time   Set up : To obtain clothing/put away  Lower Body Dressing/Undressing Lower body dressing   What is the patient wearing?: Pants, Non-skid slipper socks, Underwear Underwear - Performed by patient: Thread/unthread right underwear leg, Thread/unthread left underwear leg, Pull underwear up/down   Pants- Performed by patient: Thread/unthread right pants leg, Thread/unthread left pants leg, Pull pants up/down   Non-skid slipper socks- Performed by patient: Don/doff right sock, Don/doff left sock   Socks - Performed by patient: Don/doff right sock, Don/doff left sock   Shoes - Performed by patient: Don/doff right shoe, Don/doff left shoe            Lower body assist Assist for lower body dressing: More  than reasonable time   Set up : To obtain clothing/put away  Toileting Toileting Toileting activity did not occur: No continent bowel/bladder event Toileting steps completed by patient: Adjust clothing prior to toileting, Performs perineal hygiene, Adjust clothing after toileting Toileting steps completed by  helper: Adjust clothing prior to toileting, Performs perineal hygiene, Adjust clothing after toileting Toileting Assistive Devices: Grab bar or rail  Toileting assist Assist level: No help/no cues   Transfers Chair/bed transfer   Chair/bed transfer method: Ambulatory Chair/bed transfer assist level: Supervision or verbal cues Chair/bed transfer assistive device: Armrests, Medical sales representative     Max distance: 120 ft Assist level: Supervision or verbal cues   Wheelchair   Type: Manual Max wheelchair distance: 150' Assist Level: Supervision or verbal cues  Cognition Comprehension Comprehension assist level: Follows complex conversation/direction with no assist  Expression Expression assist level: Expresses complex ideas: With no assist  Social Interaction Social Interaction assist level: Interacts appropriately with others - No medications needed.  Problem Solving Problem solving assist level: Solves complex problems: Recognizes & self-corrects  Memory Memory assist level: Complete Independence: No helper    Medical Problem List and Plan: 1. Functional and mobility deficitssecondary to right cerebellar infarct  D/c today  Will see patient for transitional care management in 1-2 weeks 2. DVT Prophylaxis/Anticoagulation: Pharmaceutical: Lovenox 3. Neuropaty/Pain Management: Continue gabapentin.  4. Mood: patient very motivated. LCSW to follow for evaluation and support.  5. Neuropsych: This patient iscapable of making decisions on hisown behalf. 6. Skin/Wound Care: routine pressure relief measures 7. Fluids/Electrolytes/Nutrition: Monitor I/Os 8. HTN: Monitor BP bid  Ziac, Accupril and Metoprolol PTA.   Lisinopril 5 daily started 9/11  Controlled 9/18 Vitals:   08/07/17 1354 08/08/17 0411  BP: 111/63 132/73  Pulse: 77 95  Resp: 20 18  Temp: 98.1 F (36.7 C) 98.8 F (37.1 C)  SpO2: 97% 100%   9. T2DM: Hgb A1c- 6.7. Monitor BS ac/hs. Continue to use  SSI for elevated BS.   Resumed home meds, metformin 500 QID, glipizide TID on 9/11   CBG (last 3)   Recent Labs  08/07/17 1620 08/07/17 2108 08/08/17 0622  GLUCAP 115* 121* 121*   Relatively controlled 9/18 10. Hypothyroid: On supplement 11. BPH: On proscar.  12. Hypoalbuminemia  Supplement initiated 9/110 13. PAF  Started on Eliquis after discussion with Cards and Neuro on 9/11 14. ABLA  Hb 12.5 on 9/12  Cont to monitor  LOS (Days) 11 A FACE TO FACE EVALUATION WAS PERFORMED  Earl [name] Lorie Phenix 08/08/2017 8:57 AM

## 2017-08-08 NOTE — Progress Notes (Signed)
Discharged to home accompanied by spouse. Discharge information given to patient and wife by Algis Liming [facility], stated understanding of information. Belongings packed and taken out via wheelchair by NT. Margarito Liner

## 2017-08-08 NOTE — Progress Notes (Signed)
Social Work  Discharge Note  The overall goal for the admission was met for:   Discharge location: Yes - home with wife who can provide 24/7 supervision  Length of Stay: Yes - 11 days  Discharge activity level: Yes - supervision to mod independent  Home/community participation: Yes  Services provided included: MD, RD, PT, OT, SLP, RN, TR, Pharmacy and [employer]  Follow-up services arranged: Outpatient: PT, OT @ [facility], DME: rolling walker, 3n1 commode and tub bench via [place] and Patient/Family has no preference for HH/DME agencies  Comments (or additional information):  Patient/Family verbalized understanding of follow-up arrangements: Yes  Individual responsible for coordination of the follow-up plan: pt  Confirmed correct DME delivered: Shawnice Tilmon 08/08/2017    Ashlen Kiger

## 2017-08-09 ENCOUNTER — Ambulatory Visit: Payer: Medicare Other

## 2017-08-10 ENCOUNTER — Ambulatory Visit: Payer: Medicare Other | Admitting: Occupational Therapy

## 2017-08-10 ENCOUNTER — Ambulatory Visit: Payer: Medicare Other | Attending: Physical Medicine & Rehabilitation | Admitting: Physical Therapy

## 2017-08-10 ENCOUNTER — Encounter: Payer: Self-pay | Admitting: Occupational Therapy

## 2017-08-10 DIAGNOSIS — R2689 Other abnormalities of gait and mobility: Secondary | ICD-10-CM | POA: Diagnosis not present

## 2017-08-10 DIAGNOSIS — R2681 Unsteadiness on feet: Secondary | ICD-10-CM | POA: Diagnosis not present

## 2017-08-10 DIAGNOSIS — R270 Ataxia, unspecified: Secondary | ICD-10-CM | POA: Insufficient documentation

## 2017-08-10 DIAGNOSIS — R278 Other lack of coordination: Secondary | ICD-10-CM | POA: Insufficient documentation

## 2017-08-10 DIAGNOSIS — M6281 Muscle weakness (generalized): Secondary | ICD-10-CM | POA: Insufficient documentation

## 2017-08-10 NOTE — Therapy (Signed)
[facility] [address] [place], [place], [address] Phone: [phone]   Fax:  [phone]  Occupational Therapy Evaluation  Patient Details  Name: Earl [name] MRN: [id number] Date of Birth: [birth year] Referring Provider: Dr. Posey Pronto  Encounter Date: 08/10/2017      OT End of Session - 08/10/17 1625    Visit Number 1   Number of Visits 8   Date for OT Re-Evaluation 09/07/17   Authorization Type UHC Medicare - will need G code and PN every 10th visit   Authorization Time Period 60 days   Authorization - Visit Number 1   Authorization - Number of Visits 10   OT Start Time 1316   OT Stop Time 1359   OT Time Calculation (min) 43 min   Activity Tolerance Patient tolerated treatment well      Past Medical History:  Diagnosis Date  . Atrial fibrillation ([place])   . BPH (benign prostatic hyperplasia)   . Diabetes mellitus ([place])   . Hyperlipidemia   . Hypertension   . Other testicular hypofunction   . Type II or unspecified type diabetes mellitus without mention of complication, not stated as uncontrolled     Past Surgical History:  Procedure Laterality Date  . [place] and 2004  . LOOP RECORDER INSERTION N/A 07/28/2017   Procedure: LOOP RECORDER INSERTION;  Surgeon: Deboraha Sprang, MD;  Location: [facility];  Service: Cardiovascular;  Laterality: N/A;  . TEE WITHOUT CARDIOVERSION N/A 07/28/2017   Procedure: TRANSESOPHAGEAL ECHOCARDIOGRAM (TEE);  Surgeon: Sueanne Margarita, MD;  Location: [facility] ENDOSCOPY;  Service: Cardiovascular;  Laterality: N/A;    There were no vitals filed for this visit.      Subjective Assessment - 08/10/17 1323    Subjective  My right hand doesn't work  as good   Patient is accompained by: Family member  wife Earl [name]   Pertinent History Pt with R cerebellar stroke.  Check BP intermittent    Patient Stated Goals I want to get back to normal - use my R hand  and walk better.   Currently in Pain? No/denies           [facility] OT Assessment - 08/10/17 0001      Assessment   Diagnosis R cerebellar CVA   Referring Provider Dr. Posey Pronto   Onset Date 07/25/17   Assessment Inpt rehab PT and OT only     Precautions   Precautions Fall     Restrictions   Weight Bearing Restrictions No     Balance Screen   Has the patient fallen in the past 6 months No  PT eval today     Home  Environment   Family/patient expects to be discharged to: Private residence   Living Arrangements Spouse/significant other   Available Help at Discharge Available 24 hours/day   Type of [place] One level  one small step down into the den   [employer] Tub/Shower unit   [employer] Handicapped height   Additional Comments hand held shower head, transfer tub bench, grab bar on the tub     Prior Function   Level of [place] Retired   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[employer], golfing     ADL   Eating/Feeding Independent   Grooming Independent   Upper Body Bathing Modified independent   [place]  Modified independent   Upper Body Dressing Independent   Lower Body Dressing Independent   Toilet Transfer Supervision/safety  wife states "they told me to do that when we left rehab"   [place]   Tub/Shower Transfer Modified independent   ADL comments see above     IADL   Shopping Needs to be accompanied on any shopping trip   [employer] Housekeeping Does not participate in any housekeeping tasks   Meal Prep Needs to have meals prepared and served   [employer] on family or friends for transportation   Medication Management Is responsible for taking medication in correct dosages at correct time   Physiological scientist financial matters independently (budgets, writes checks,  pays rent, bills goes to bank), collects and keeps track of income     Mobility   Mobility Status Needs assist   Mobility Status Comments needs supervision in home and in community     Written Expression   Dominant Hand Right   Handwriting 90% legible  for name only;increased time     Vision - History   Baseline Vision Wears glasses all the time   Additional Comments Pt denies any changes in vision.      Vision Assessment   Eye Alignment Impaired (comment)   Ocular Range of Motion Within Functional Limits   Tracking/Visual Pursuits --  decreased tracking/fixation R inferior fields   Saccades Undershoots  with vertical saccades only   Visual Fields No apparent deficits   Comment Pt states that vision was initially blurry but he no longer is experiencing this.  Suspect earlier on pt had more significant visual vestibular integration issues. Currently pt is mildly symptomatic with horizontal head turns in sitting (2/10 with intensity less than 5 seconds).  Pt with ataxic gait.       Activity Tolerance   Activity Tolerance Tolerate 30+ min activity without fatigue     Cognition   Overall Cognitive Status Within Functional Limits for tasks assessed   Mini Mental State Exam  Pt describes at times word finding deficits "Sometimes I have to really think to come up with word"     Sensation   Light Touch Appears Intact   Hot/Cold Appears Intact   Proprioception Appears Intact     Coordination   Gross Motor Movements are Fluid and Coordinated Yes   Fine Motor Movements are Fluid and Coordinated No   9 Hole Peg Test Right;Left   Right 9 Hole Peg Test 1.10.18   Left 9 Hole Peg Test 33.78     Tone   Assessment Location Right Upper Extremity     ROM / Strength   AROM / PROM / Strength AROM;Strength     AROM   Overall AROM  Within functional limits for tasks performed     Strength   Overall Strength Within functional limits for tasks performed   Overall Strength Comments BUE's      Hand Function   Right Hand Gross Grasp Functional   Right Hand Grip (lbs) 60   Left Hand Gross Grasp Functional   Left Hand Grip (lbs) 50     RUE Tone   RUE Tone Within Functional Limits                              OT Long Term Goals - 08/10/17 1610      OT LONG  TERM GOAL #1   Title Pt will be mod I with HEP for coordination for RUE - 09/07/2017   Status New     OT LONG TERM GOAL #2   Title Pt will be able to write a sentence level with 100% legibility with AE prn   Status New     OT LONG TERM GOAL #3   Title Pt will demonstrate improved coordination as evidenced by decreasing time on 9 hole peg test by at least 20 seconds to assist in using RUE for fine motor tasks. (baseline= 1.10.18)   Status New     OT LONG TERM GOAL #4   Title Pt will be mod I for simple snack and beverage prep at ambulatory level   Status New     OT LONG TERM GOAL #5   Title Pt will be mod I for toilet transfers   Status New     OT LONG TERM GOAL #6   Title Pt and wife will verbalize understanding of driving eval recommendations   Status New               Plan - 08/10/17 1614    Clinical Impression Statement Pt is a 76 year old male s/p R cerebellar CVA on 07/25/2017. Pt was discharged home on 08/08/2017 after stay on inpt rehab.  Pt presents today with the following impairments that impact independence:  decreased balance, ataxia, decreased coordination of R dominant UE, decreased functional use of R dominant UE, decreased activity tolerance,  Pt will benefit from skilled OT to address these deficts to maximize independence.    Occupational Profile and client history currently impacting functional performance PMH: DM, paroxysmal A FIB, hypothyroidism, MGUS, HTN, HLD   Occupational performance deficits (Please refer to evaluation for details): IADL's;Leisure   Rehab Potential Good   OT Frequency 2x / week   OT Duration 4 weeks   OT Treatment/Interventions  Self-care/ADL training;Therapeutic exercise;Neuromuscular education;DME and/or AE instruction;Therapist, nutritional;Therapeutic activities;Patient/family education;Balance training   Plan review goals and POC, initiate HEP for coordination,  assess writing/tracing.    Clinical Decision Making Limited treatment options, no task modification necessary   Consulted and Agree with Plan of Care Patient;Family member/caregiver   Family Member Consulted wife      Patient will benefit from skilled therapeutic intervention in order to improve the following deficits and impairments:  Decreased activity tolerance, Decreased balance, Decreased coordination, Decreased mobility, Decreased knowledge of use of DME, Impaired UE functional use  Visit Diagnosis: Ataxia - Plan: Ot plan of care cert/re-cert  Other lack of coordination - Plan: Ot plan of care cert/re-cert  Unsteadiness on feet - Plan: Ot plan of care cert/re-cert      G-Codes - 81/82/99 1626    Functional Limitation Carrying, moving and handling objects   Carrying, Moving and Handling Objects Current Status [phone]) At least 83 percent but less than 100 percent impaired, limited or restricted   Carrying, Moving and Handling Objects Goal Status (C7893) At least 40 percent but less than 60 percent impaired, limited or restricted      Problem List Patient Active Problem List   Diagnosis Date Noted  . Acute blood loss anemia   . Labile blood glucose   . PAF (paroxysmal atrial fibrillation) ([place])   . Hypoalbuminemia due to protein-calorie malnutrition ([place])   . Diabetes mellitus ([place])   . Benign essential HTN   . Ataxia, post-stroke   . Cerebellar stroke ([place])   . Dizziness 07/25/2017  .  Hypothyroidism 09/13/2016  . BMI 26.53,adult 08/28/2015  . Medicare annual wellness visit, subsequent 08/28/2015  . 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 Vitamin D deficiency 04/29/2014  . Medication management 01/03/2014   . 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 BPH (benign prostatic hyperplasia)   . Testosterone deficiency   . 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 Anemia 11/07/2013  . Hypertension 11/07/2013  . Hyperlipidemia 11/07/2013  . Paroxysmal atrial fibrillation ([place]) 09/18/2012    Quay Burow, OTR/L 08/10/2017, 4:29 PM  [facility] [address] [place], [place], [address] Phone: [phone]   Fax:  [phone]  Name: Earl [name] MRN: [id number] Date of Birth: [birth year]

## 2017-08-11 NOTE — Therapy (Signed)
[facility] [address] [place], [place], [address] Phone: [phone]   Fax:  [phone]  Physical Therapy Evaluation  Patient Details  Name: Earl [name] MRN: [id number] Date of Birth: 05/29/1941 Referring Provider: Dellie Catholic)  Encounter Date: 08/10/2017      PT End of Session - 08/11/17 1604    Visit Number 1   Number of Visits 18   Date for PT Re-Evaluation 10/09/17   Authorization Type UHC Medicare-GCODE and progress note due every 10th visit   PT Start Time 1401   PT Stop Time 1446   PT Time Calculation (min) 45 min   Equipment Utilized During Treatment Gait belt   Activity Tolerance Patient tolerated treatment well   Behavior During Therapy [facility] for tasks assessed/performed      Past Medical History:  Diagnosis Date  . Atrial fibrillation ([place])   . BPH (benign prostatic hyperplasia)   . Diabetes mellitus ([place])   . Hyperlipidemia   . Hypertension   . Other testicular hypofunction   . Type II or unspecified type diabetes mellitus without mention of complication, not stated as uncontrolled     Past Surgical History:  Procedure Laterality Date  . [place] and 2004  . LOOP RECORDER INSERTION N/A 07/28/2017   Procedure: LOOP RECORDER INSERTION;  Surgeon: Deboraha Sprang, MD;  Location: [facility];  Service: Cardiovascular;  Laterality: N/A;  . TEE WITHOUT CARDIOVERSION N/A 07/28/2017   Procedure: TRANSESOPHAGEAL ECHOCARDIOGRAM (TEE);  Surgeon: Sueanne Margarita, MD;  Location: [facility] ENDOSCOPY;  Service: Cardiovascular;  Laterality: N/A;    There were no vitals filed for this visit.       Subjective Assessment - 08/10/17 1406    Subjective Pt had a cerebellar stroke on 07/25/17.  He wants to work on balance, walking, driving, and R hand.  Prior to hospitalization, pt was independent and active.  He is currently using RW.  He reports no falls.   Patient is  accompained by: Family member  Wife   Pertinent History DM, PAF, HTN   Patient Stated Goals Work on balance, walking and get back to independent   Currently in Pain? No/denies            [facility] PT Assessment - 08/10/17 1408      Assessment   Medical Diagnosis R cerebellar CVA   Referring Provider Posey Pronto, Ankit Derrill Memo   Onset Date/Surgical Date 07/25/17     Precautions   Precautions Fall  No driving; walking with supervision with RW at all times     Balance Screen   Has the patient fallen in the past 6 months No   Has the patient had a decrease in activity level because of a fear of falling?  Yes   Is the patient reluctant to leave their home because of a fear of falling?  No     Home Environment   Living Environment Private residence   Living Arrangements Spouse/significant other   Available Help at Discharge Family   Type of [place] to enter   Entrance Stairs-Number of Steps 3   Entrance Stairs-Rails Right   [place] One level  One step into sunken den area   [place] - 2 wheels     Prior Function   Level of [place] Retired   [employer] used to do roofing and  yard maintainence   Leisure bowling, golfing, volunteers for [employer]     Observation/Other Assessments   Focus on Therapeutic Outcomes (FOTO)  Functional Status Intake score 45; Neuro QOL 35.3%     Sensation   Light Touch Appears Intact     ROM / Strength   AROM / PROM / Strength Strength     Strength   Overall Strength Deficits   Overall Strength Comments Grossly tested 4/5 R quads and hamstrings; 4/5 R hip flexion; 5/5 grossly tested LLE     Transfers   Transfers Sit to Stand;Stand to Sit   Sit to Stand 5: Supervision;With upper extremity assist;From chair/3-in-1   Stand to Sit 5: Supervision;With upper extremity assist;To chair/3-in-1     Ambulation/Gait   Ambulation/Gait Yes   Ambulation/Gait  Assistance 5: Supervision   Ambulation Distance (Feet) 120 Feet   Assistive device Rolling walker   Gait Pattern Step-through pattern;Decreased step length - right;Decreased step length - left;Wide base of support;Ataxic;Decreased weight shift to right   Ambulation Surface Level;Indoor   Gait velocity 29.08 sec = 1.13 ft/sec     Standardized Balance Assessment   Standardized Balance Assessment Timed Up and Go Test;Berg Balance Test     Berg Balance Test   Sit to Stand Able to stand  independently using hands   Standing Unsupported Able to stand 2 minutes with supervision   Sitting with Back Unsupported but Feet Supported on Floor or Stool Able to sit safely and securely 2 minutes   Stand to Sit Sits safely with minimal use of hands   Transfers Able to transfer with verbal cueing and /or supervision   Standing Unsupported with Eyes Closed Able to stand 10 seconds with supervision   Standing Ubsupported with Feet Together Able to place feet together independently and stand for 1 minute with supervision  increased sway noted   From Standing, Reach Forward with Outstretched Arm Reaches forward but needs supervision   From Standing Position, Pick up Object from Floor Unable to try/needs assist to keep balance   From Standing Position, Turn to Look Behind Over each Shoulder Needs supervision when turning   Turn 360 Degrees Needs assistance while turning   Standing Unsupported, Alternately Place Feet on Step/Stool Needs assistance to keep from falling or unable to try   Standing Unsupported, One Foot in Front Able to take small step independently and hold 30 seconds   Standing on One Leg Unable to try or needs assist to prevent fall   Total Score 26   Berg comment: Scores <45/56 indicate increased fall risk; <37/56 indicate need to use RW for assistive device     Timed Up and Go Test   Normal TUG (seconds) 34.19   TUG Comments Scores >13.5 sec indicate increased fall risk; >30 seconds  indicates increased dificultyw ith ADLs in home            Objective measurements completed on examination: See above findings.                  PT Education - 08/11/17 1603    Education provided Yes   Education Details Discussed fall risk per Merrilee Jansky and TUG and gait velocity measures and that rolling walker is appropriate device, to be used at all times with gait; continued need for supervision of wife with gait at this time   Person(s) Educated Patient;Spouse   Methods Explanation   Comprehension Verbalized understanding  PT Short Term Goals - 08/11/17 1615      PT SHORT TERM GOAL #1   Title Pt will be independent with HEP for improved balance, strength, and gait.  TARGET 09/08/17   Time 5   Period Weeks   Status New   Target Date 09/08/17     PT SHORT TERM GOAL #2   Title Pt will perform at least 6 of 10 reps of sit<>stand with minimal to no UE support for improved transfer efficiency and safety   Time 5   Period Weeks   Status New   Target Date 09/08/17     PT SHORT TERM GOAL #3   Title Pt will improve Berg Balance score to at least 34/56 for decreased fall risk.   Time 5   Period Weeks   Status New   Target Date 09/08/17     PT SHORT TERM GOAL #4   Title Pt will improve TUG score to less than or equal to 28 seconds for decreased fall risk.   Time 5   Period Weeks   Status New   Target Date 09/08/17     PT SHORT TERM GOAL #5   Title Pt will verbalize understanding of CVA warning signs and symptoms.   Time 5   Period Weeks   Status New   Target Date 09/08/17           PT Long Term Goals - 08/11/17 1618      PT LONG TERM GOAL #1   Title Pt/wife will verbalize understanding of fall prevention in the home environment.  TARGET 10/06/17   Time 9   Period Weeks   Status New   Target Date 10/06/17     PT LONG TERM GOAL #2   Title Pt will improve gait velocity to at least 1.8 ft/sec for decreased fall risk/improved efficiency  and safety with gait.   Time 9   Period Weeks   Status New   Target Date 10/06/17     PT LONG TERM GOAL #3   Title Pt will improve Berg Balance score to at least 42/56 for decreased fall risk.   Time 9   Period Weeks   Status New   Target Date 10/06/17     PT LONG TERM GOAL #4   Title Pt will improve TUG score to less than or equal to 20 seconds for decreased fall risk.   Time 9   Period Weeks   Status New   Target Date 10/06/17     PT LONG TERM GOAL #5   Title Pt will ambulate at least 50-100 ft using cane, with supervision for improved independence with gait with lesser assistive device.   Time 9   Period Weeks   Status New   Target Date 10/06/17                Plan - 08/11/17 1606    Clinical Impression Statement Pt is a 76 year old male who presents to OP PT with history of R cerebellar CVA on 07/25/17.  Prior to hospitalization for CVA, patient was independent with mobility, worked in the yard and volunteered for [employer] on [employer].  Currently, patient is ambulating with rolling walker, and he presents with decreased muscle strength, ataxic gait pattern, decreased safety and independence with gait and transfers, decreased balance.  He is at fall risk per Berg, TUG, and gait velocity measures.  Pt would benefit from skilled PT to address the above stated deficits  to decrease fall risk and to improve functional mobility and independence.   History and Personal Factors relevant to plan of care: PMH >3 co-morbidities, Independent prior to CVA   Clinical Presentation Evolving   Clinical Presentation due to: hx of CVA 07/25/17, at fall risk per 3 mobility measures (Berg, TUG, and gait velocity)   Clinical Decision Making Moderate   Rehab Potential Good   PT Frequency --  1x/wk for 1 week, then 2x/wk for 8 weeks   PT Duration Other (comment)  POC = 9 weeks   PT Treatment/Interventions ADLs/Self Care Home Management;Functional mobility training;Gait training;Therapeutic  activities;DME Instruction;Therapeutic exercise;Balance training;Neuromuscular re-education;Patient/family education;Orthotic Fit/Training   PT Next Visit Plan Initiate HEP for strengthening and balance, gait training with rolling walker; CVA education   Consulted and Agree with Plan of Care Patient;Family member/caregiver      Patient will benefit from skilled therapeutic intervention in order to improve the following deficits and impairments:  Abnormal gait, Decreased balance, Decreased mobility, Decreased strength, Difficulty walking  Visit Diagnosis: Other abnormalities of gait and mobility  Unsteadiness on feet  Muscle weakness (generalized)      G-Codes - Aug 25, 2017 1622    Functional Assessment Tool Used (Outpatient Only) gait velocity 1.13 ft/sec, TUG 34.19 sec, BERG 26/56   Functional Limitation Mobility: Walking and moving around   Mobility: Walking and Moving Around Current Status [phone]) At least 60 percent but less than 80 percent impaired, limited or restricted   Mobility: Walking and Moving Around Goal Status [phone]) At least 20 percent but less than 40 percent impaired, limited or restricted       Problem List Patient Active Problem List   Diagnosis Date Noted  . Acute blood loss anemia   . Labile blood glucose   . PAF (paroxysmal atrial fibrillation) ([place])   . Hypoalbuminemia due to protein-calorie malnutrition ([place])   . Diabetes mellitus ([place])   . Benign essential HTN   . Ataxia, post-stroke   . Cerebellar stroke ([place])   . Dizziness 07/25/2017  . Hypothyroidism 09/13/2016  . BMI 26.53,adult 08/28/2015  . Medicare annual wellness visit, subsequent 08/28/2015  . 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 Vitamin D deficiency 04/29/2014  . Medication management 01/03/2014  . 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 BPH (benign prostatic hyperplasia)   . Testosterone deficiency   . 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   . Anemia 11/07/2013  . Hypertension 11/07/2013  . Hyperlipidemia 11/07/2013  . Paroxysmal atrial fibrillation ([place]) 09/18/2012    Torianne Laflam W. 08/11/2017, 4:27 PM  Frazier Butt., PT   [facility] [address] [place], [place], [address] Phone: [phone]   Fax:  [phone]  Name: Dontarius Sheley MRN: [id number] Date of Birth: 04-17-1941

## 2017-08-15 ENCOUNTER — Encounter: Payer: Self-pay | Admitting: Internal Medicine

## 2017-08-15 ENCOUNTER — Ambulatory Visit: Payer: Self-pay | Admitting: Physician Assistant

## 2017-08-15 ENCOUNTER — Other Ambulatory Visit: Payer: Self-pay | Admitting: *Deleted

## 2017-08-15 ENCOUNTER — Ambulatory Visit (INDEPENDENT_AMBULATORY_CARE_PROVIDER_SITE_OTHER): Payer: Medicare Other | Admitting: Internal Medicine

## 2017-08-15 VITALS — BP 114/72 | HR 80 | Temp 97.5°F | Resp 18 | Ht 71.0 in | Wt 181.6 lb

## 2017-08-15 DIAGNOSIS — E039 Hypothyroidism, unspecified: Secondary | ICD-10-CM | POA: Diagnosis not present

## 2017-08-15 DIAGNOSIS — E1122 Type 2 diabetes mellitus with diabetic chronic kidney disease: Secondary | ICD-10-CM

## 2017-08-15 DIAGNOSIS — I10 Essential (primary) hypertension: Secondary | ICD-10-CM

## 2017-08-15 DIAGNOSIS — E559 Vitamin D deficiency, unspecified: Secondary | ICD-10-CM | POA: Diagnosis not present

## 2017-08-15 DIAGNOSIS — E782 Mixed hyperlipidemia: Secondary | ICD-10-CM | POA: Diagnosis not present

## 2017-08-15 DIAGNOSIS — N182 Chronic kidney disease, stage 2 (mild): Secondary | ICD-10-CM

## 2017-08-15 DIAGNOSIS — Z23 Encounter for immunization: Secondary | ICD-10-CM | POA: Diagnosis not present

## 2017-08-15 DIAGNOSIS — Z79899 Other long term (current) drug therapy: Secondary | ICD-10-CM

## 2017-08-15 DIAGNOSIS — I63441 Cerebral infarction due to embolism of right cerebellar artery: Secondary | ICD-10-CM | POA: Diagnosis not present

## 2017-08-15 MED ORDER — GLIPIZIDE 5 MG PO TABS: 5.0000 mg | ORAL_TABLET | Freq: Two times a day (BID) | ORAL | 1 refills | Status: DC

## 2017-08-15 MED ORDER — ATORVASTATIN CALCIUM 40 MG PO TABS: 40.0000 mg | ORAL_TABLET | Freq: Every day | ORAL | 1 refills | Status: DC

## 2017-08-15 MED ORDER — GABAPENTIN 300 MG PO CAPS: ORAL_CAPSULE | ORAL | 1 refills | Status: DC

## 2017-08-15 MED ORDER — LISINOPRIL 5 MG PO TABS: 5.0000 mg | ORAL_TABLET | Freq: Every day | ORAL | 1 refills | Status: DC

## 2017-08-15 MED ORDER — LEVOTHYROXINE SODIUM 50 MCG PO TABS: ORAL_TABLET | ORAL | 1 refills | Status: DC

## 2017-08-15 MED ORDER — APIXABAN 5 MG PO TABS: 5.0000 mg | ORAL_TABLET | Freq: Two times a day (BID) | ORAL | 1 refills | Status: DC

## 2017-08-15 MED ORDER — FINASTERIDE 5 MG PO TABS: ORAL_TABLET | ORAL | 1 refills | Status: DC

## 2017-08-15 NOTE — Patient Instructions (Signed)
Recommend Adult Low Dose Aspirin or  coated  Aspirin 81 mg daily  To reduce risk of Colon Cancer 20 %,  Skin Cancer 26 % ,  Melanoma 46%  and  Pancreatic cancer 60% +++++++++++++++++++++++++ Vitamin D goal  is between 70-100.  Please make sure that you are taking your Vitamin D as directed.  It is very important as a natural anti-inflammatory  helping hair, skin, and nails, as well as reducing stroke and heart attack risk.  It helps your bones and helps with mood. It also decreases numerous cancer risks so please take it as directed.  Low Vit D is associated with a 200-300% higher risk for CANCER  and 200-300% higher risk for HEART   ATTACK  &  STROKE.   ...................................... It is also associated with higher death rate at younger ages,  autoimmune diseases like Rheumatoid arthritis, Lupus, Multiple Sclerosis.    Also many other serious conditions, like depression, Alzheimer's Dementia, infertility, muscle aches, fatigue, fibromyalgia - just to name a few. ++++++++++++++++++++ Recommend the book "The END of DIETING" by Dr Joel Fuhrman  & the book "The END of DIABETES " by Dr Joel Fuhrman At [employer] - get book & Audio CD's    Being diabetic has a  300% increased risk for heart attack, stroke, cancer, and alzheimer- type vascular dementia. It is very important that you work harder with diet by avoiding all foods that are white. Avoid white rice (brown & wild rice is OK), white potatoes (sweetpotatoes in moderation is OK), White bread or wheat bread or anything made out of white flour like bagels, donuts, rolls, buns, biscuits, cakes, pastries, cookies, pizza crust, and pasta (made from white flour & egg whites) - vegetarian pasta or spinach or wheat pasta is OK. Multigrain breads like Arnold's or Pepper[employer], or multigrain sandwich thins or flatbreads.  Diet, exercise and weight loss can reverse and cure diabetes in the early stages.  Diet, exercise and weight loss is  very important in the control and prevention of complications of diabetes which affects every system in your body, ie. Brain - dementia/stroke, eyes - glaucoma/blindness, heart - heart attack/heart failure, kidneys - dialysis, stomach - gastric paralysis, intestines - malabsorption, nerves - severe painful neuritis, circulation - gangrene & loss of a leg(s), and finally cancer and Alzheimers.    I recommend avoid fried & greasy foods,  sweets/candy, white rice (brown or wild rice or Quinoa is OK), white potatoes (sweet potatoes are OK) - anything made from white flour - bagels, doughnuts, rolls, buns, biscuits,white and wheat breads, pizza crust and traditional pasta made of white flour & egg white(vegetarian pasta or spinach or wheat pasta is OK).  Multi-grain bread is OK - like multi-grain flat bread or sandwich thins. Avoid alcohol in excess. Exercise is also important.    Eat all the vegetables you want - avoid meat, especially red meat and dairy - especially cheese.  Cheese is the most concentrated form of trans-fats which is the worst thing to clog up our arteries. Veggie cheese is OK which can be found in the fresh produce section at [employer] or [employer] or [employer]  +++++++++++++++++++++ DASH Eating Plan  DASH stands for "Dietary Approaches to Stop Hypertension."   The DASH eating plan is a healthy eating plan that has been shown to reduce high blood pressure (hypertension). Additional health benefits may include reducing the risk of type 2 diabetes mellitus, heart disease, and stroke. The DASH eating plan may also   help with weight loss. WHAT DO I NEED TO KNOW ABOUT THE DASH EATING PLAN? For the DASH eating plan, you will follow these general guidelines:  Choose foods with a percent daily value for sodium of less than 5% (as listed on the food label).  Use salt-free seasonings or herbs instead of table salt or sea salt.  Check with your health care provider or pharmacist before  using salt substitutes.  Eat lower-sodium products, often labeled as "lower sodium" or "no salt added."  Eat fresh foods.  Eat more vegetables, fruits, and low-fat dairy products.  Choose whole grains. Look for the word "whole" as the first word in the ingredient list.  Choose fish   Limit sweets, desserts, sugars, and sugary drinks.  Choose heart-healthy fats.  Eat veggie cheese   Eat more home-cooked food and less restaurant, buffet, and fast food.  Limit fried foods.  Cook foods using methods other than frying.  Limit canned vegetables. If you do use them, rinse them well to decrease the sodium.  When eating at a restaurant, ask that your food be prepared with less salt, or no salt if possible.                      WHAT FOODS CAN I EAT? Read Dr Joel Fuhrman's books on The End of Dieting & The End of Diabetes  Grains Whole grain or whole wheat bread. Brown rice. Whole grain or whole wheat pasta. Quinoa, bulgur, and whole grain cereals. Low-sodium cereals. Corn or whole wheat flour tortillas. Whole grain cornbread. Whole grain crackers. Low-sodium crackers.  Vegetables Fresh or frozen vegetables (raw, steamed, roasted, or grilled). Low-sodium or reduced-sodium tomato and vegetable juices. Low-sodium or reduced-sodium tomato sauce and paste. Low-sodium or reduced-sodium canned vegetables.   Fruits All fresh, canned (in natural juice), or frozen fruits.  Protein Products  All fish and seafood.  Dried beans, peas, or lentils. Unsalted nuts and seeds. Unsalted canned beans.  Dairy Low-fat dairy products, such as skim or 1% milk, 2% or reduced-fat cheeses, low-fat ricotta or cottage cheese, or plain low-fat yogurt. Low-sodium or reduced-sodium cheeses.  Fats and Oils Tub margarines without trans fats. Light or reduced-fat mayonnaise and salad dressings (reduced sodium). Avocado. Safflower, olive, or canola oils. Natural peanut or almond butter.  Other Unsalted popcorn  and pretzels. The items listed above may not be a complete list of recommended foods or beverages. Contact your dietitian for more options.  +++++++++++++++  WHAT FOODS ARE NOT RECOMMENDED? Grains/ White flour or wheat flour White bread. White pasta. White rice. Refined cornbread. Bagels and croissants. Crackers that contain trans fat.  Vegetables  Creamed or fried vegetables. Vegetables in a . Regular canned vegetables. Regular canned tomato sauce and paste. Regular tomato and vegetable juices.  Fruits Dried fruits. Canned fruit in light or heavy syrup. Fruit juice.  Meat and Other Protein Products Meat in general - RED meat & White meat.  Fatty cuts of meat. Ribs, chicken wings, all processed meats as bacon, sausage, bologna, salami, fatback, hot dogs, bratwurst and packaged luncheon meats.  Dairy Whole or 2% milk, cream, half-and-half, and cream cheese. Whole-fat or sweetened yogurt. Full-fat cheeses or blue cheese. Non-dairy creamers and whipped toppings. Processed cheese, cheese spreads, or cheese curds.  Condiments Onion and garlic salt, seasoned salt, table salt, and sea salt. Canned and packaged gravies. Worcestershire sauce. Tartar sauce. Barbecue sauce. Teriyaki sauce. Soy sauce, including reduced sodium. Steak sauce. Fish sauce. Oyster sauce. Cocktail   sauce. Horseradish. Ketchup and mustard. Meat flavorings and tenderizers. Bouillon cubes. Hot sauce. Tabasco sauce. Marinades. Taco seasonings. Relishes.  Fats and Oils Butter, stick margarine, lard, shortening and bacon fat. Coconut, palm kernel, or palm oils. Regular salad dressings.  Pickles and olives. Salted popcorn and pretzels.  The items listed above may not be a complete list of foods and beverages to avoid.   

## 2017-08-15 NOTE — Progress Notes (Signed)
[place]     This very nice 76 y.o. 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 On 9/7-18 he was transferred [facility] and has just been home now for 1 week.  Now he presents for post hospital follow up.  Patient was contacted post discharge by office staff to assure stability and schedule f/u.      Hospitalization discharge instructions and medications are reconciled with the patient.      IN Add'n to HTN & T2_DM, patient is also followed with HLD,  MGUS, Hypothyroidism and Vit D Deficiency.      Patient is treated for HTN (1980) & BP has been controlled at home. Today's BP 114/72. Patient has had no complaints of any cardiac type chest pain, palpitations, dyspnea/orthopnea/PND, dizziness, claudication, or dependent edema. Errata of Dx of   pAfib and will be deleted from the PMHx.     Hyperlipidemia is controlled with diet & meds. Patient denies myalgias or other med SE's. Last Lipids were  Lab Results  Component Value Date   CHOL 147 07/26/2017   HDL 51 07/26/2017   LDLCALC 87 07/26/2017   TRIG 45 07/26/2017   CHOLHDL 2.9 07/26/2017      Also, the patient has history of T2_NIDDM since 1994 controlled w/Metformin & Glipizide . In acute hospital he received SS Insulin coverage. He denies symptoms of reactive hypoglycemia, diabetic polys, paresthesias or visual blurring.  Recent  A1c was not at goal:  Lab Results  Component Value Date   HGBA1C 6.7 (H) 07/26/2017      Further, the patient also has history of Vitamin D Deficiency and supplements vitamin D without any suspected side-effects. Last vitamin D was   Lab Results  Component Value Date   VD25OH 68 04/14/2017   Current Outpatient Prescriptions on File Prior to Visit  Medication Sig  . Cholecalciferol (VITAMIN D PO) Take 5,000 Units by mouth daily.  . Cyanocobalamin (B-12) 50 MCG TABS Take 50 mcg by mouth daily.  . Magnesium 250  MG TABS Take 250 mg by mouth daily.  . metFORMIN (GLUCOPHAGE-XR) 500 MG 24 hr tablet TAKE 1 TABLET BY MOUTH 4  TIMES DAILY FOR DIABETES (Patient taking differently: Take 500 mg by mouth four times a day for diabetes)  . Multiple Vitamin (MULTIVITAMIN) capsule Take 1 capsule by mouth daily.    . vitamin C (ASCORBIC ACID) 500 MG tablet Take 500 mg by mouth daily.     No current facility-administered medications on file prior to visit.    Allergies  Allergen Reactions  . Penicillins Other (See Comments)   PMHx:   Past Medical History:  Diagnosis Date  . Atrial fibrillation ([place])   . BPH (benign prostatic hyperplasia)   . Diabetes mellitus ([place])   . Hyperlipidemia   . Hypertension   . Other testicular hypofunction   . Type II or unspecified type diabetes mellitus without mention of complication, not stated as uncontrolled    Immunization History  Administered Date(s) Administered  . DT 02/09/2015  . Influenza Split 08/07/2013  . Influenza, High Dose Seasonal PF 08/19/2014, 08/28/2015, 09/13/2016, 08/15/2017  . Pneumococcal Conjugate-13 06/10/2016  . Pneumococcal Polysaccharide-23 08/13/2008  . Td 11/22/2003   Past Surgical History:  Procedure Laterality Date  . [place] and 2004  . LOOP RECORDER INSERTION N/A 07/28/2017   Procedure: LOOP RECORDER INSERTION;  Surgeon: Caryl Comes,  Revonda Standard, MD;  Location: [facility];  Service: Cardiovascular;  Laterality: N/A;  . TEE WITHOUT CARDIOVERSION N/A 07/28/2017   Procedure: TRANSESOPHAGEAL ECHOCARDIOGRAM (TEE);  Surgeon: Sueanne Margarita, MD;  Location: [facility];  Service: Cardiovascular;  Laterality: N/A;   FHx:    Reviewed / unchanged  SHx:    Reviewed / unchanged  Systems Review:  Constitutional: Denies fever, chills, wt changes, headaches, insomnia, fatigue, night sweats, change in appetite. Eyes: Denies redness, blurred vision, diplopia, discharge, itchy, watery eyes.  ENT: Denies discharge, congestion,  post nasal drip, epistaxis, sore throat, earache, hearing loss, dental pain, tinnitus, vertigo, sinus pain, snoring.  CV: Denies chest pain, palpitations, irregular heartbeat, syncope, dyspnea, diaphoresis, orthopnea, PND, claudication or edema. Respiratory: denies cough, dyspnea, DOE, pleurisy, hoarseness, laryngitis, wheezing.  Gastrointestinal: Denies dysphagia, odynophagia, heartburn, reflux, water brash, abdominal pain or cramps, nausea, vomiting, bloating, diarrhea, constipation, hematemesis, melena, hematochezia  or hemorrhoids. Genitourinary: Denies dysuria, frequency, urgency, nocturia, hesitancy, discharge, hematuria or flank pain. Musculoskeletal: Denies arthralgias, myalgias, stiffness, jt. swelling, pain, limping or strain/sprain.  Skin: Denies pruritus, rash, hives, warts, acne, eczema or change in skin lesion(s). Neuro: No weakness, tremor, incoordination, spasms, paresthesia or pain. Psychiatric: Denies confusion, memory loss or sensory loss. Endo: Denies change in weight, skin or hair change.  Heme/Lymph: No excessive bleeding, bruising or enlarged lymph nodes.  Physical Exam  BP 114/72   Pulse 80   Temp (!) 97.5 F (36.4 C)   Resp 18   Ht 5\' 11"  (1.803 m)   Wt 181 lb 9.6 oz (82.4 kg)   BMI 25.33 kg/m   Appears well nourished, well groomed  and in no distress.  Eyes: PERRLA, EOMs, conjunctiva no swelling or erythema. Sinuses: No frontal/maxillary tenderness ENT/Mouth: EAC's clear, TM's nl w/o erythema, bulging. Nares clear w/o erythema, swelling, exudates. Oropharynx clear without erythema or exudates. Oral hygiene is good. Tongue normal, non obstructing. Hearing intact.  Neck: Supple. Thyroid nl. Car 2+/2+ without bruits, nodes or JVD. Chest: Respirations nl with BS clear & equal w/o rales, rhonchi, wheezing or stridor.  Cor: Heart sounds normal w/ regular rate and rhythm without sig. murmurs, gallops, clicks or rubs. Peripheral pulses normal and equal  without edema.   Abdomen: Soft & bowel sounds normal. Non-tender w/o guarding, rebound, hernias, masses or organomegaly.  Lymphatics: Unremarkable.  Musculoskeletal: Full ROM all peripheral extremities, joint stability, 5/5 strength and normal gait.  Skin: Warm, dry without exposed rashes, lesions or ecchymosis apparent.  Neuro: Cranial nerves intact. Mild Rt hyperreflexia. Mild dystaxia on the Rt  UE>LE. Ambulating with a walker. Speech sl slurred.  Pysch: Alert & oriented x 3.  Insight and judgement nl & appropriate. No ideations.  Assessment and Plan:   1. 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 Essential hypertension  - Continue medication, monitor blood pressure at home.  - Continue DASH diet. Reminder to go to the ER if any CP,  SOB, nausea, dizziness, severe HA, changes vision/speech.  - CBC with Differential/Platelet - BASIC METABOLIC PANEL WITH GFR  3. Type 2 diabetes mellitus with stage 2 chronic kidney disease, without long-term current use of insulin (HCC)  - Continue diet, exercise/LPT , lifestyle modifications.  - Monitor appropriate labs.  4. Hyperlipidemia, mixed  - Continue diet/meds, exercise,& lifestyle modifications.  - Continue monitor periodic cholesterol/liver & renal functions   5. Vitamin D deficiency  - Continue supplementation.  6. Hypothyroidism  7. Medication management  - CBC  with Differential/Platelet - BASIC METABOLIC PANEL WITH GFR  8. Need for immunization against influenza  - Flu vaccine HIGH DOSE PF (Fluzone High dose)         Discussed  regular exercise, BP monitoring, weight control to achieve/maintain BMI less than 25 and discussed med and SE's. Recommended labs to assess and monitor clinical status with further disposition pending results of labs. Over 40 minutes of exam, counseling, chart review and high complex critical decision making was performed.

## 2017-08-16 ENCOUNTER — Ambulatory Visit (INDEPENDENT_AMBULATORY_CARE_PROVIDER_SITE_OTHER): Payer: Self-pay | Admitting: *Deleted

## 2017-08-16 DIAGNOSIS — I639 Cerebral infarction, unspecified: Secondary | ICD-10-CM

## 2017-08-16 LAB — CBC WITH DIFFERENTIAL/PLATELET
BASOS PCT: 0.3 %
Basophils Absolute: 18 cells/uL (ref 0–200)
Eosinophils Absolute: 71 cells/uL (ref 15–500)
Eosinophils Relative: 1.2 %
HEMATOCRIT: 36.7 % — AB (ref 38.5–50.0)
Hemoglobin: 12.7 g/dL — ABNORMAL LOW (ref 13.2–17.1)
LYMPHS ABS: 2254 {cells}/uL (ref 850–3900)
MCH: 28.5 pg (ref 27.0–33.0)
MCHC: 34.6 g/dL (ref 32.0–36.0)
MCV: 82.5 fL (ref 80.0–100.0)
MPV: 9.7 fL (ref 7.5–12.5)
Monocytes Relative: 9.4 %
Neutro Abs: 3003 cells/uL (ref 1500–7800)
Neutrophils Relative %: 50.9 %
Platelets: 308 10*3/uL (ref 140–400)
RBC: 4.45 10*6/uL (ref 4.20–5.80)
RDW: 12.6 % (ref 11.0–15.0)
Total Lymphocyte: 38.2 %
WBC: 5.9 10*3/uL (ref 3.8–10.8)
WBCMIX: 555 {cells}/uL (ref 200–950)

## 2017-08-16 LAB — CUP PACEART INCLINIC DEVICE CHECK
Date Time Interrogation Session: 20180926163030
MDC IDC PG IMPLANT DT: 20180907

## 2017-08-16 LAB — BASIC METABOLIC PANEL WITH GFR
BUN: 15 mg/dL (ref 7–25)
CHLORIDE: 105 mmol/L (ref 98–110)
CO2: 26 mmol/L (ref 20–32)
Calcium: 8.9 mg/dL (ref 8.6–10.3)
Creat: 1 mg/dL (ref 0.70–1.18)
GFR, EST AFRICAN AMERICAN: 84 mL/min/{1.73_m2} (ref >=60)
GFR, Est Non African American: 73 mL/min/{1.73_m2} (ref >=60)
Glucose, Bld: 94 mg/dL (ref 65–99)
Potassium: 4.3 mmol/L (ref 3.5–5.3)
Sodium: 140 mmol/L (ref 135–146)

## 2017-08-16 NOTE — Progress Notes (Signed)
Wound check appointment. Steri-strips removed. Wound without redness or edema. Incision edges approximated, wound well healed. Battery status: GOOD. R-waves 1.74 mV. 0 symptom episodes. 6 tachy episodes-- previously reviewed by SK per phone note 08/01/17-- ECGs appear AFib +Eliquis, longest 4 minutes 41 seconds, Max V rate 176 bpm. 0 pause episodes, 0 brady episodes. 0 AF episodes (0% burden). Monthly summary reports and ROV with SK in 12 months.

## 2017-08-23 ENCOUNTER — Telehealth: Payer: Self-pay | Admitting: Cardiology

## 2017-08-23 ENCOUNTER — Ambulatory Visit: Payer: Medicare Other | Attending: Physical Medicine & Rehabilitation | Admitting: Physical Therapy

## 2017-08-23 ENCOUNTER — Encounter: Payer: Self-pay | Admitting: Physical Therapy

## 2017-08-23 ENCOUNTER — Ambulatory Visit: Payer: Medicare Other | Admitting: Occupational Therapy

## 2017-08-23 DIAGNOSIS — R2689 Other abnormalities of gait and mobility: Secondary | ICD-10-CM | POA: Diagnosis not present

## 2017-08-23 DIAGNOSIS — M6281 Muscle weakness (generalized): Secondary | ICD-10-CM

## 2017-08-23 DIAGNOSIS — R278 Other lack of coordination: Secondary | ICD-10-CM | POA: Insufficient documentation

## 2017-08-23 DIAGNOSIS — R270 Ataxia, unspecified: Secondary | ICD-10-CM | POA: Diagnosis not present

## 2017-08-23 DIAGNOSIS — R2681 Unsteadiness on feet: Secondary | ICD-10-CM | POA: Insufficient documentation

## 2017-08-23 NOTE — Patient Instructions (Signed)
Standing Marching   Stand at counter with right hand on the counter, lift foot up and hold for 5 seconds. Repeat with other leg.  Repeat 20 times. Do 1-2 sessions per day. Do 5 days/week.   http://[email or web address].exer.us/344   Copyright  VHI. All rights reserved.   Hip Backward Kick   Using a chair for balance, keep legs shoulder width apart and toes pointed for- ward. Slowly extend one leg back, keeping knee straight. Hold for 5 seconds. Do not lean forward. Repeat with other leg. Repeat 20 times. Do 1-2 sessions per day. Do 5 days per week.  http://[email or web address].exer.us/340   Copyright  VHI. All rights reserved.     Hip Side Kick   Holding a chair for balance, keep legs shoulder width apart and toes pointed forward. Lift a leg out to side, keeping knee straight. Do not lean. Repeat using other leg. Repeat 20 times. Do 1-2 sessions per day. Do 5 days per week.   http://[email or web address].exer.us/342   Copyright  VHI. All rights reserved.   Feet Heel-Toe "Tandem", Varied Arm Positions - Eyes Open   With eyes open, right foot steps past the heel of left foot, and as close to standing on a tight rope as is safe. Arms at your side (right hand on the counter) and looking straight ahead at a stationary object. Hold 30 seconds. Repeat 3  times per session.  Repeat everything with left foot forward. 3 reps.   Do 1-2 sessions per day.  Copyright  VHI. All rights reserved.

## 2017-08-23 NOTE — Therapy (Signed)
[facility] [address] [place], [place], [address] Phone: [phone]   Fax:  [phone]  Physical Therapy Treatment  Patient Details  Name: Earl [name] MRN: [id number] Date of Birth: 06/30/1941 Referring Provider: Dellie Catholic)  Encounter Date: 08/23/2017      PT End of Session - 08/23/17 1224    Visit Number 2   Number of Visits 18   Date for PT Re-Evaluation 10/09/17   Authorization Type UHC Medicare-GCODE and progress note due every 10th visit   PT Start Time 0847   PT Stop Time 0928   PT Time Calculation (min) 41 min   Equipment Utilized During Treatment Gait belt   Activity Tolerance Patient tolerated treatment well   Behavior During Therapy [facility] for tasks assessed/performed      Past Medical History:  Diagnosis Date  . BPH (benign prostatic hyperplasia)   . Diabetes mellitus ([place])   . Hyperlipidemia   . Hypertension   . Other testicular hypofunction   . Type II or unspecified type diabetes mellitus without mention of complication, not stated as uncontrolled     Past Surgical History:  Procedure Laterality Date  . [place] and 2004  . LOOP RECORDER INSERTION N/A 07/28/2017   Procedure: LOOP RECORDER INSERTION;  Surgeon: Deboraha Sprang, MD;  Location: [place] [facility];  Service: Cardiovascular;  Laterality: N/A;  . TEE WITHOUT CARDIOVERSION N/A 07/28/2017   Procedure: TRANSESOPHAGEAL ECHOCARDIOGRAM (TEE);  Surgeon: Sueanne Margarita, MD;  Location: [facility] ENDOSCOPY;  Service: Cardiovascular;  Laterality: N/A;    There were no vitals filed for this visit.      Subjective Assessment - 08/23/17 0849    Subjective No falls or near falls since last visit. Reports he uses his RW at all times EXCEPT he's been practicing walking around the bed without device (with wife nearby). Educated patient on the potential injuries/risk he is taking and would advise not to do so.     Patient is accompained by: Family member  Wife   Pertinent History DM, PAF, HTN   Patient Stated Goals Work on balance, walking and get back to independent   Currently in Pain? No/denies                         [facility] Adult PT Treatment/Exercise - 08/23/17 1231      Transfers   Transfers Sit to Stand;Stand to Sit   Sit to Stand 5: Supervision;With upper extremity assist;From chair/3-in-1   Sit to Stand Details (indicate cue type and reason) several times including from chair at kitchen table;   Stand to Sit 5: Supervision;With upper extremity assist;To chair/3-in-1   Stand to Sit Details no cues needed for sequencing; supervision for safety (especially at kitchen table)     Ambulation/Gait   Ambulation/Gait Yes  y   Ambulation/Gait Assistance 5: Supervision   Ambulation/Gait Assistance Details vc for proximity to RW and gaze forward at eye level   Ambulation Distance (Feet) 150 Feet   Assistive device Rolling walker   Gait Pattern Step-through pattern;Decreased step length - right;Decreased step length - left;Wide base of support;Ataxic;Decreased weight shift to right   Ambulation Surface Level     Exercises   Exercises Other Exercises  Patient performed 10-20 reps with hands-on, verbal and visual cues of all exercises added to HEP to assure proper technique and advance to the appropriate level of challenge for  his balance.   Also did alternating heel and toe raises x 10 however pt with tendency to rock too far back and then catch himself by hands grabbing onto the sink. Did not put in his HEP                PT Education - 08/23/17 1224    Education provided Yes   Education Details see pt instructions for HEP   Person(s) Educated Patient;Spouse   Methods Explanation;Demonstration;Tactile cues;Verbal cues;Handout   Comprehension Verbalized understanding;Returned demonstration;Verbal cues required;Tactile cues required;Need further instruction           PT Short Term Goals - 08/11/17 1615      PT SHORT TERM GOAL #1   Title Pt will be independent with HEP for improved balance, strength, and gait.  TARGET 09/08/17   Time 5   Period Weeks   Status New   Target Date 09/08/17     PT SHORT TERM GOAL #2   Title Pt will perform at least 6 of 10 reps of sit<>stand with minimal to no UE support for improved transfer efficiency and safety   Time 5   Period Weeks   Status New   Target Date 09/08/17     PT SHORT TERM GOAL #3   Title Pt will improve Berg Balance score to at least 34/56 for decreased fall risk.   Time 5   Period Weeks   Status New   Target Date 09/08/17     PT SHORT TERM GOAL #4   Title Pt will improve TUG score to less than or equal to 28 seconds for decreased fall risk.   Time 5   Period Weeks   Status New   Target Date 09/08/17     PT SHORT TERM GOAL #5   Title Pt will verbalize understanding of CVA warning signs and symptoms.   Time 5   Period Weeks   Status New   Target Date 09/08/17           PT Long Term Goals - 08/11/17 1618      PT LONG TERM GOAL #1   Title Pt/wife will verbalize understanding of fall prevention in the home environment.  TARGET 10/06/17   Time 9   Period Weeks   Status New   Target Date 10/06/17     PT LONG TERM GOAL #2   Title Pt will improve gait velocity to at least 1.8 ft/sec for decreased fall risk/improved efficiency and safety with gait.   Time 9   Period Weeks   Status New   Target Date 10/06/17     PT LONG TERM GOAL #3   Title Pt will improve Berg Balance score to at least 42/56 for decreased fall risk.   Time 9   Period Weeks   Status New   Target Date 10/06/17     PT LONG TERM GOAL #4   Title Pt will improve TUG score to less than or equal to 20 seconds for decreased fall risk.   Time 9   Period Weeks   Status New   Target Date 10/06/17     PT LONG TERM GOAL #5   Title Pt will ambulate at least 50-100 ft using cane, with supervision for  improved independence with gait with lesser assistive device.   Time 9   Period Weeks   Status New   Target Date 10/06/17  Plan - 08/23/17 1226    Clinical Impression Statement Session focused on instruction in exercises for his HEP for both balance and LE strengthening. Wife present and very attentive, asking good questions. Patient educated to stand at counter and how to progressively lighten how much support he is getting from his UE(s). Patient highly motivated and anticipate will progress well.    Rehab Potential Good   PT Frequency --  1x/wk for 1 week, then 2x/wk for 8 weeks   PT Duration Other (comment)  POC = 9 weeks   PT Treatment/Interventions ADLs/Self Care Home Management;Functional mobility training;Gait training;Therapeutic activities;DME Instruction;Therapeutic exercise;Balance training;Neuromuscular re-education;Patient/family education;Orthotic Fit/Training   PT Next Visit Plan check ex's given 10/3 and add to HEP for strengthening and balance as approp, gait training with rolling walker; CVA education   Consulted and Agree with Plan of Care Patient;Family member/caregiver      Patient will benefit from skilled therapeutic intervention in order to improve the following deficits and impairments:  Abnormal gait, Decreased balance, Decreased mobility, Decreased strength, Difficulty walking  Visit Diagnosis: Unsteadiness on feet  Muscle weakness (generalized)  Other abnormalities of gait and mobility     Problem List Patient Active Problem List   Diagnosis Date Noted  . Acute blood loss anemia   . Labile blood glucose   . PAF (paroxysmal atrial fibrillation) ([place])   . Hypoalbuminemia due to protein-calorie malnutrition ([place])   . Diabetes mellitus ([place])   . Benign essential HTN   . Ataxia, post-stroke   . Acute cerebrovascular accident (CVA) of cerebellum ([place])   . Dizziness 07/25/2017  . Hypothyroidism 09/13/2016  . BMI 26.53,adult  08/28/2015  . Medicare annual wellness visit, subsequent 08/28/2015  . 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 Vitamin D deficiency 04/29/2014  . Medication management 01/03/2014  . 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 BPH (benign prostatic hyperplasia)   . Testosterone deficiency   . 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 Anemia 11/07/2013  . Hypertension 11/07/2013  . Hyperlipidemia 11/07/2013  . Paroxysmal atrial fibrillation ([place]) 09/18/2012    Rexanne Mano, PT 08/23/2017, 12:34 PM  [facility] [address] [place], [place], [address] Phone: [phone]   Fax:  [phone]  Name: Earl [name] MRN: [id number] Date of Birth: Sep 28, 1941

## 2017-08-23 NOTE — Patient Instructions (Addendum)
  Coordination Activities  Perform the following activities for 20 minutes 1 times per day with right hand(s).   Rotate ball in fingertips (clockwise and counter-clockwise).  Toss ball between hands.  Toss ball in air and catch with the same hand.  Deal cards with your thumb (Hold deck in hand and push card off top with thumb).  Pick up coins and stack.  Pick up coins one at a time until you get 5-10 in your hand, then move coins from palm to fingertips to stack one at a time.  Practice writing and/or typing.  Screw together nuts and bolts, then unfasten.  Rotate 2 golf balls in hand

## 2017-08-23 NOTE — Therapy (Signed)
[facility] [address] [place], [place], [address] Phone: [phone]   Fax:  [phone]  Occupational Therapy Treatment  Patient Details  Name: Earl [name] MRN: [id number] Date of Birth: Feb 24, 1941 Referring Provider: Dr. Posey Pronto  Encounter Date: 08/23/2017      OT End of Session - 08/23/17 0813    Visit Number 2   Number of Visits 8   Date for OT Re-Evaluation 09/07/17   Authorization Type UHC Medicare - will need G code and PN every 10th visit   Authorization Time Period 60 days   Authorization - Visit Number 2   Authorization - Number of Visits 10   OT Start Time 0806   OT Stop Time 0845   OT Time Calculation (min) 39 min   Activity Tolerance Patient tolerated treatment well   Behavior During Therapy [facility] for tasks assessed/performed      Past Medical History:  Diagnosis Date  . BPH (benign prostatic hyperplasia)   . Diabetes mellitus ([place])   . Hyperlipidemia   . Hypertension   . Other testicular hypofunction   . Type II or unspecified type diabetes mellitus without mention of complication, not stated as uncontrolled     Past Surgical History:  Procedure Laterality Date  . [place] and 2004  . LOOP RECORDER INSERTION N/A 07/28/2017   Procedure: LOOP RECORDER INSERTION;  Surgeon: Deboraha Sprang, MD;  Location: [place] [facility];  Service: Cardiovascular;  Laterality: N/A;  . TEE WITHOUT CARDIOVERSION N/A 07/28/2017   Procedure: TRANSESOPHAGEAL ECHOCARDIOGRAM (TEE);  Surgeon: Sueanne Margarita, MD;  Location: [facility] ENDOSCOPY;  Service: Cardiovascular;  Laterality: N/A;    There were no vitals filed for this visit.      Subjective Assessment - 08/23/17 0808    Subjective  Denies pain   Pertinent History Pt with R cerebellar stroke.  Check BP intermittent    Patient Stated Goals I want to get back to normal - use my R hand and walk better.   Currently in Pain? No/denies                Treatment: Handwriting task, pt is able to write name legibility, and sentence level with minimal decreased legibility. Tracing and writing activities using foam grip, with min difficulty, increased time               OT Education - 08/23/17 0822    Education provided Yes   Education Details coordination HEP- see pt instructions, use of foam grip for writing   Person(s) Educated Patient;Spouse   Methods Explanation;Demonstration;Verbal cues;Handout   Comprehension Verbalized understanding;Returned demonstration;Verbal cues required             OT Long Term Goals - 08/10/17 1610      OT LONG TERM GOAL #1   Title Pt will be mod I with HEP for coordination for RUE - 09/07/2017   Status New     OT LONG TERM GOAL #2   Title Pt will be able to write a sentence level with 100% legibility with AE prn   Status New     OT LONG TERM GOAL #3   Title Pt will demonstrate improved coordination as evidenced by decreasing time on 9 hole peg test by at least 20 seconds to assist in using RUE for fine motor tasks. (baseline= 1.10.18)   Status New     OT LONG TERM GOAL #4   Title Pt  will be mod I for simple snack and beverage prep at ambulatory level   Status New     OT LONG TERM GOAL #5   Title Pt will be mod I for toilet transfers   Status New     [place] #6   Title Pt and wife will verbalize understanding of driving eval recommendations   Status New               Plan - 08/23/17 0815    Clinical Impression Statement Pt is progressing towards goals. Pt demonstrates improving RUE functional use and fine motor coordination.   Rehab Potential Good   OT Frequency 2x / week   OT Duration 4 weeks   OT Treatment/Interventions Self-care/ADL training;Therapeutic exercise;Neuromuscular education;DME and/or AE instruction;Therapist, nutritional;Therapeutic activities;Patient/family education;Balance training   Plan check on HEP,  coordination, simple snack prep   Consulted and Agree with Plan of Care Patient;Family member/caregiver   Family Member Consulted wife      Patient will benefit from skilled therapeutic intervention in order to improve the following deficits and impairments:  Decreased activity tolerance, Decreased balance, Decreased coordination, Decreased mobility, Decreased knowledge of use of DME, Impaired UE functional use  Visit Diagnosis: Other lack of coordination  Muscle weakness (generalized)    Problem List Patient Active Problem List   Diagnosis Date Noted  . Acute blood loss anemia   . Labile blood glucose   . PAF (paroxysmal atrial fibrillation) ([place])   . Hypoalbuminemia due to protein-calorie malnutrition ([place])   . Diabetes mellitus ([place])   . Benign essential HTN   . Ataxia, post-stroke   . Acute cerebrovascular accident (CVA) of cerebellum ([place])   . Dizziness 07/25/2017  . Hypothyroidism 09/13/2016  . BMI 26.53,adult 08/28/2015  . Medicare annual wellness visit, subsequent 08/28/2015  . 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 Vitamin D deficiency 04/29/2014  . Medication management 01/03/2014  . 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 BPH (benign prostatic hyperplasia)   . Testosterone deficiency   . 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 Anemia 11/07/2013  . Hypertension 11/07/2013  . Hyperlipidemia 11/07/2013  . Paroxysmal atrial fibrillation ([place]) 09/18/2012    Murial Beam 08/23/2017, 12:27 PM  [facility] [address] [place], [place], [address] Phone: [phone]   Fax:  [phone]  Name: Wynter Grave MRN: [id number] Date of Birth: Dec 28, 1940

## 2017-08-23 NOTE — Telephone Encounter (Signed)
Attempted to call pt b/c his home monitor has not updated in at least 14 days. Automated message stating the number is no longer in service.

## 2017-08-25 ENCOUNTER — Encounter: Payer: Self-pay | Admitting: Physical Medicine & Rehabilitation

## 2017-08-25 ENCOUNTER — Encounter: Payer: Medicare Other | Attending: Physical Medicine & Rehabilitation | Admitting: Physical Medicine & Rehabilitation

## 2017-08-25 VITALS — BP 152/82 | HR 94 | Resp 14

## 2017-08-25 DIAGNOSIS — R269 Unspecified abnormalities of gait and mobility: Secondary | ICD-10-CM | POA: Diagnosis not present

## 2017-08-25 DIAGNOSIS — E1159 Type 2 diabetes mellitus with other circulatory complications: Secondary | ICD-10-CM | POA: Diagnosis not present

## 2017-08-25 DIAGNOSIS — E785 Hyperlipidemia, unspecified: Secondary | ICD-10-CM | POA: Insufficient documentation

## 2017-08-25 DIAGNOSIS — I639 Cerebral infarction, unspecified: Secondary | ICD-10-CM | POA: Diagnosis not present

## 2017-08-25 DIAGNOSIS — I10 Essential (primary) hypertension: Secondary | ICD-10-CM | POA: Diagnosis not present

## 2017-08-25 DIAGNOSIS — Z8249 Family history of ischemic heart disease and other diseases of the circulatory system: Secondary | ICD-10-CM | POA: Diagnosis not present

## 2017-08-25 DIAGNOSIS — I480 Paroxysmal atrial fibrillation: Secondary | ICD-10-CM | POA: Diagnosis not present

## 2017-08-25 DIAGNOSIS — R42 Dizziness and giddiness: Secondary | ICD-10-CM

## 2017-08-25 DIAGNOSIS — E1151 Type 2 diabetes mellitus with diabetic peripheral angiopathy without gangrene: Secondary | ICD-10-CM | POA: Insufficient documentation

## 2017-08-25 NOTE — Progress Notes (Signed)
Subjective:    Patient ID: Earl [name], male    DOB: 05-02-1941, 76 y.o.   MRN: [id number]  HPI 76 y.o. male with history of  HTN, questionable history PAF, T2DM presents for hospital follow up for cerebellar stroke.   At discharge, he was instructed to follow up with PCP, which he has done. He does not have an appointment with Neurology. His BP is elevated. He has been checking glucose through his urine.  He continues to take Eliquis. His dizziness has significantly improved. He notes mild increase in edema. Ambulating with walker.  Denies falls. Therapies: 2/week.  No DME needed.   Pain Inventory Average Pain 0 Pain Right Now 0 My pain is no pain  In the last 24 hours, has pain interfered with the following? General activity 0 Relation with others 0 Enjoyment of life 0 What TIME of day is your pain at its worst? no pain Sleep (in general) no pain  Pain is worse with: no pain Pain improves with: no pain Relief from Meds: no pain  Mobility walk with assistance use a cane ability to climb steps?  no do you drive?  no Do you have any goals in this area?  yes  Function retired I need assistance with the following:  meal prep, household duties and shopping Do you have any goals in this area?  yes  Neuro/Psych trouble walking dizziness  Prior Studies hospital f/u  Physicians involved in your care hospital f/u   Family History  Problem Relation Age of Onset  . Sickle cell anemia Mother   . Diabetes Father   . Stomach cancer Father   . Colon cancer Neg Hx   . CAD Neg Hx    Social History   Social History  . Marital status: Married    Spouse name: N/A  . Number of children: 2  . Years of education: N/A   Occupational History  . 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 Smoking status: Never Smoker  . Smokeless tobacco: Never Used  . Alcohol use No  . Drug use: No  . Sexual activity: Not Asked   Other Topics  Concern  . None   Social History Narrative  . None   Past Surgical History:  Procedure Laterality Date  . [place] and 2004  . LOOP RECORDER INSERTION N/A 07/28/2017   Procedure: LOOP RECORDER INSERTION;  Surgeon: Deboraha Sprang, MD;  Location: [facility];  Service: Cardiovascular;  Laterality: N/A;  . TEE WITHOUT CARDIOVERSION N/A 07/28/2017   Procedure: TRANSESOPHAGEAL ECHOCARDIOGRAM (TEE);  Surgeon: Sueanne Margarita, MD;  Location[facility] ENDOSCOPY;  Service: Cardiovascular;  Laterality: N/A;   Past Medical History:  Diagnosis Date  . BPH (benign prostatic hyperplasia)   . Diabetes mellitus ([place])   . Hyperlipidemia   . Hypertension   . Other testicular hypofunction   . Type II or unspecified type diabetes mellitus without mention of complication, not stated as uncontrolled    BP (!) 152/82   Pulse 94   Resp 14   SpO2 98%   Opioid Risk Score:   Fall Risk Score:  `1  Depression screen PHQ 2/9  Depression screen [facility] 2/9 08/25/2017 04/15/2017 12/23/2016 03/01/2016 11/30/2015 08/28/2015 11/12/2014  Decreased Interest 0 0 0 0 0 0 0  Down, Depressed, Hopeless 0 0 0 0 0 0 0  PHQ - 2 Score 0 0 0 0 0 0  0  Altered sleeping 0 - - - - - -  Tired, decreased energy 1 - - - - - -  Change in appetite 0 - - - - - -  Feeling bad or failure about yourself  0 - - - - - -  Trouble concentrating 0 - - - - - -  Moving slowly or fidgety/restless 0 - - - - - -  Suicidal thoughts 0 - - - - - -  PHQ-9 Score 1 - - - - - -  Difficult doing work/chores Not difficult at all - - - - - -    Review of Systems  Constitutional: Negative.   HENT: Negative.   Eyes: Negative.   Respiratory: Negative.   Cardiovascular: Negative.   Gastrointestinal: Negative.   Endocrine: Negative.   Genitourinary: Negative.   Musculoskeletal: Positive for gait problem.  Skin: Negative.   Allergic/Immunologic: Negative.   Neurological: Positive for dizziness.  Hematological: Negative.     Psychiatric/Behavioral: Negative.   All other systems reviewed and are negative.      Objective:   Physical Exam Constitutional: He appears well-developed and well-nourished. No distress.  HENT: Normocephalic and atraumatic.  Eyes: EOM are normal. No discharge.  Cardiovascular: RRR. No JVD. Respiratory: Effort normal and breath sounds normal.  GI: Bowel sounds are normal. He exhibits no distension.  Musculoskeletal: Mild edema b/l. No tenderness.  Neurological: He is alert and oriented.  Speech clear.  No ataxia RUE Motor: RUE/RLE grossly 4+/5 prox to distal. LUE/LLE 5/5.  Skin: Skin is warm and dry. He is not diaphoretic.  Psychiatric: He has a normal mood and affect. His behavior is normal. Judgment and thought content normal.      Assessment & Plan:  76 y.o. male with history of  HTN, questionable history PAF, T2DM presents for hospital follow up for cerebellar stroke.   1.  Functional and mobility deficits secondary to right cerebellar infarct  Cont therapies  Follow up with Neurology- needs appointment  2. HTN  Cont meds  Pt to follow up with PCP for adjustments  3. T2DM  Cont meds  4. PAF  Cont Eliquis  5. Gait abnormality  Cont therpaies  Cont walker for safety  Meds reviewed Referrals reviewed - needs appointment with Neurology All questions answered

## 2017-08-25 NOTE — Patient Instructions (Signed)
Please follow up with:   Rosalin Hawking, MD. Call in 1 day(s).   Specialty:  Neurology Why:  for follow up appointment Contact information: [address] [place] [address] [phone]

## 2017-08-28 ENCOUNTER — Encounter: Payer: Self-pay | Admitting: Occupational Therapy

## 2017-08-28 ENCOUNTER — Ambulatory Visit: Payer: Medicare Other | Admitting: Physical Therapy

## 2017-08-28 ENCOUNTER — Ambulatory Visit: Payer: Medicare Other | Admitting: Occupational Therapy

## 2017-08-28 ENCOUNTER — Ambulatory Visit (INDEPENDENT_AMBULATORY_CARE_PROVIDER_SITE_OTHER): Payer: Medicare Other | Admitting: *Deleted

## 2017-08-28 ENCOUNTER — Encounter: Payer: Self-pay | Admitting: Physical Therapy

## 2017-08-28 DIAGNOSIS — R2681 Unsteadiness on feet: Secondary | ICD-10-CM | POA: Diagnosis not present

## 2017-08-28 DIAGNOSIS — R270 Ataxia, unspecified: Secondary | ICD-10-CM | POA: Diagnosis not present

## 2017-08-28 DIAGNOSIS — I639 Cerebral infarction, unspecified: Secondary | ICD-10-CM

## 2017-08-28 DIAGNOSIS — R2689 Other abnormalities of gait and mobility: Secondary | ICD-10-CM | POA: Diagnosis not present

## 2017-08-28 DIAGNOSIS — M6281 Muscle weakness (generalized): Secondary | ICD-10-CM | POA: Diagnosis not present

## 2017-08-28 DIAGNOSIS — R278 Other lack of coordination: Secondary | ICD-10-CM | POA: Diagnosis not present

## 2017-08-28 NOTE — Therapy (Signed)
[facility] [address] [place], [place], [address] Phone: [phone]   Fax:  [phone]  Physical Therapy Treatment  Patient Details  Name: Earl [name] MRN: [id number] Date of Birth: [birth year] Referring Provider: Dellie Catholic)  Encounter Date: 08/28/2017      PT End of Session - 08/28/17 1927    Visit Number 3   Number of Visits 18   Date for PT Re-Evaluation 10/09/17   Authorization Type UHC Medicare-GCODE and progress note due every 10th visit   PT Start Time 1400   PT Stop Time 1445   PT Time Calculation (min) 45 min   Equipment Utilized During Treatment Gait belt   Activity Tolerance Patient tolerated treatment well   Behavior During Therapy [facility] for tasks assessed/performed      Past Medical History:  Diagnosis Date  . BPH (benign prostatic hyperplasia)   . Diabetes mellitus ([place])   . Hyperlipidemia   . Hypertension   . Other testicular hypofunction   . Type II or unspecified type diabetes mellitus without mention of complication, not stated as uncontrolled     Past Surgical History:  Procedure Laterality Date  . [place] and 2004  . LOOP RECORDER INSERTION N/A 07/28/2017   Procedure: LOOP RECORDER INSERTION;  Surgeon: Deboraha Sprang, MD;  Location: [facility];  Service: Cardiovascular;  Laterality: N/A;  . TEE WITHOUT CARDIOVERSION N/A 07/28/2017   Procedure: TRANSESOPHAGEAL ECHOCARDIOGRAM (TEE);  Surgeon: Sueanne Margarita, MD;  Location: [facility] ENDOSCOPY;  Service: Cardiovascular;  Laterality: N/A;    There were no vitals filed for this visit.      Subjective Assessment - 08/28/17 1400    Subjective Things are going very well. Is only walking without the RW when in PT (or around edge of bed).    Patient is accompained by: Family member  Wife   Pertinent History DM, PAF, HTN   Patient Stated Goals Work on balance, walking and get back to  independent   Currently in Pain? No/denies                         [facility] Adult PT Treatment/Exercise - 08/28/17 0001      Bed Mobility   Supine to Sit 5: Supervision   Supine to Sit Details (indicate cue type and reason) incr effort, near need for assist   Sit to Supine 6: Modified independent (Device/Increase time)     Transfers   Transfers Sit to Stand;Stand to Sit   Sit to Stand 5: Supervision   Sit to Stand Details (indicate cue type and reason) progressed UE support, to hands on thighs, to no hands   Stand to Sit 5: Supervision   Stand to Sit Details cues for slow, control;    Number of Reps 10 reps   Comments last 5 with PT calling out "stop" at diffierent points as he lowered and return to stand     Ambulation/Gait   Ambulation/Gait Yes   Ambulation/Gait Assistance 5: Supervision;4: Min assist   Ambulation/Gait Assistance Details with RW, supervision for safety and vc for upright posture and proximity to RW; no device with min assist for imbalance   Ambulation Distance (Feet) 100 Feet  240, 120   Assistive device Rolling walker   Gait Pattern Step-through pattern;Decreased step length - right;Decreased step length - left;Wide base of support;Ataxic;Decreased weight shift to right   Ambulation Surface  Level   Pre-Gait Activities anterior-posterior weight-shifts bil with single UE support     Exercises   Other Exercises  bridging with feet apart; 5 sec hold x 10 reps             Balance Exercises - 08/28/17 1431      Balance Exercises: Standing   Standing Eyes Opened Wide (BOA);Head turns  10 reps horizontal and vertical   SLS with Vectors Solid surface;Upper extremity assist 1  heel taps   Balance Beam blue; single finger support; step off/on forward x 20   Tandem Gait Upper extremity support;4 reps   Retro Gait Upper extremity support;4 reps   Sidestepping Upper extremity support;2 reps   Turning Both   Other Standing Exercises  anterior/posterior weight-shifting; bil x 10 reps each with emphasis on weight-shifting and heel strike           PT Education - 08/28/17 1446    Education provided Yes   Education Details see pt instructions for addition to HEP; continue to use RW for all ambulation   Person(s) Educated Patient;Spouse   Methods Explanation;Demonstration;Handout;Verbal cues   Comprehension Verbalized understanding;Returned demonstration;Verbal cues required;Need further instruction          PT Short Term Goals - 08/11/17 1615      PT SHORT TERM GOAL #1   Title Pt will be independent with HEP for improved balance, strength, and gait.  TARGET 09/08/17   Time 5   Period Weeks   Status New   Target Date 09/08/17     PT SHORT TERM GOAL #2   Title Pt will perform at least 6 of 10 reps of sit<>stand with minimal to no UE support for improved transfer efficiency and safety   Time 5   Period Weeks   Status New   Target Date 09/08/17     PT SHORT TERM GOAL #3   Title Pt will improve Berg Balance score to at least 34/56 for decreased fall risk.   Time 5   Period Weeks   Status New   Target Date 09/08/17     PT SHORT TERM GOAL #4   Title Pt will improve TUG score to less than or equal to 28 seconds for decreased fall risk.   Time 5   Period Weeks   Status New   Target Date 09/08/17     PT SHORT TERM GOAL #5   Title Pt will verbalize understanding of CVA warning signs and symptoms.   Time 5   Period Weeks   Status New   Target Date 09/08/17           PT Long Term Goals - 08/11/17 1618      PT LONG TERM GOAL #1   Title Pt/wife will verbalize understanding of fall prevention in the home environment.  TARGET 10/06/17   Time 9   Period Weeks   Status New   Target Date 10/06/17     PT LONG TERM GOAL #2   Title Pt will improve gait velocity to at least 1.8 ft/sec for decreased fall risk/improved efficiency and safety with gait.   Time 9   Period Weeks   Status New   Target Date  10/06/17     PT LONG TERM GOAL #3   Title Pt will improve Berg Balance score to at least 42/56 for decreased fall risk.   Time 9   Period Weeks   Status New   Target Date 10/06/17  PT LONG TERM GOAL #4   Title Pt will improve TUG score to less than or equal to 20 seconds for decreased fall risk.   Time 9   Period Weeks   Status New   Target Date 10/06/17     PT LONG TERM GOAL #5   Title Pt will ambulate at least 50-100 ft using cane, with supervision for improved independence with gait with lesser assistive device.   Time 9   Period Weeks   Status New   Target Date 10/06/17               Plan - 08/28/17 1929    Clinical Impression Statement Session focused on balance, strengthening, and pre-gait/gait training. Patient demonstrated previous HEP exercises with excellent technique and reports his wife monitors his technique and how often he does the exercises. Patient already showing improvement and will continue to benefit from PT to progress towards LTGs.   Rehab Potential Good   PT Frequency --  1x/wk for 1 week, then 2x/wk for 8 weeks   PT Duration Other (comment)  POC = 9 weeks   PT Treatment/Interventions ADLs/Self Care Home Management;Functional mobility training;Gait training;Therapeutic activities;DME Instruction;Therapeutic exercise;Balance training;Neuromuscular re-education;Patient/family education;Orthotic Fit/Training   PT Next Visit Plan add to HEP for strengthening and balance as approp, gait training with rolling walker vs no device vs ?cane; CVA education   Consulted and Agree with Plan of Care Patient;Family member/caregiver      Patient will benefit from skilled therapeutic intervention in order to improve the following deficits and impairments:  Abnormal gait, Decreased balance, Decreased mobility, Decreased strength, Difficulty walking  Visit Diagnosis: Muscle weakness (generalized)  Unsteadiness on feet  Other abnormalities of gait and  mobility     Problem List Patient Active Problem List   Diagnosis Date Noted  . Acute blood loss anemia   . Labile blood glucose   . PAF (paroxysmal atrial fibrillation) ([place])   . Hypoalbuminemia due to protein-calorie malnutrition ([place])   . Diabetes mellitus ([place])   . Benign essential HTN   . Ataxia, post-stroke   . Acute cerebrovascular accident (CVA) of cerebellum ([place])   . Dizziness 07/25/2017  . Hypothyroidism 09/13/2016  . BMI 26.53,adult 08/28/2015  . Medicare annual wellness visit, subsequent 08/28/2015  . 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 Vitamin D deficiency 04/29/2014  . Medication management 01/03/2014  . 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 BPH (benign prostatic hyperplasia)   . Testosterone deficiency   . 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 Anemia 11/07/2013  . Hypertension 11/07/2013  . Hyperlipidemia 11/07/2013  . Paroxysmal atrial fibrillation ([place]) 09/18/2012    Rexanne Mano, PT 08/28/2017, 7:33 PM  [facility] [address] [place], [place], [address] Phone: [phone]   Fax:  [phone]  Name: Earl [name] MRN: [id number] Date of Birth: 05-23-1941

## 2017-08-28 NOTE — Therapy (Signed)
[facility] [address] [place], [place], [address] Phone: [phone]   Fax:  [phone]  Occupational Therapy Treatment  Patient Details  Name: Earl [name] MRN: [id number] Date of Birth: 1941/10/24 Referring Provider: Dr. Posey Pronto  Encounter Date: 08/28/2017      OT End of Session - 08/28/17 1553    Visit Number 3   Date for OT Re-Evaluation 09/07/17   Authorization Type UHC Medicare - will need G code and PN every 10th visit   Authorization Time Period 60 days   Authorization - Visit Number 3   Authorization - Number of Visits 10   OT Start Time 1446   OT Stop Time 1528   OT Time Calculation (min) 42 min   Activity Tolerance Patient tolerated treatment well      Past Medical History:  Diagnosis Date  . BPH (benign prostatic hyperplasia)   . Diabetes mellitus ([place])   . Hyperlipidemia   . Hypertension   . Other testicular hypofunction   . Type II or unspecified type diabetes mellitus without mention of complication, not stated as uncontrolled     Past Surgical History:  Procedure Laterality Date  . [place] and 2004  . LOOP RECORDER INSERTION N/A 07/28/2017   Procedure: LOOP RECORDER INSERTION;  Surgeon: Deboraha Sprang, MD;  Location: [facility];  Service: Cardiovascular;  Laterality: N/A;  . TEE WITHOUT CARDIOVERSION N/A 07/28/2017   Procedure: TRANSESOPHAGEAL ECHOCARDIOGRAM (TEE);  Surgeon: Sueanne Margarita, MD;  Location: [facility] ENDOSCOPY;  Service: Cardiovascular;  Laterality: N/A;    There were no vitals filed for this visit.      Subjective Assessment - 08/28/17 1446    Subjective  I walked alot in PT   Patient is accompained by: Family member  wife   Pertinent History Pt with R cerebellar stroke.  Check BP intermittent    Patient Stated Goals I want to get back to normal - use my R hand and walk better.   Currently in Pain? No/denies                       OT Treatments/Exercises (OP) - 08/28/17 1544      ADLs   Driving Discussed driving recommendations including ensuring medical clearance, graduated return to driving vs. formal driving eval. Pt given written information on all three.  Wife and pt verbalized understanding.  Pt understands that he is not ready yet to return to driving however he and his wife plan to discuss with rehab MD in 4 weeks. Pt and wife verbalized understanding of all information. Also reviewed written copy of sample driivng eval.     ADL Comments Assessed coordination - pt with significant time improvement (see goal section). Will upgrade LTG based on pt's progress today.  Pt stated he is very pleased with progress.  Upgraded existing HEP for coordination by introducing use of timer for cards and picking up pennies and asking pt to beat his last best time. Wife and pt verbalize understanding.       Fine Motor Coordination   Other Fine Motor Exercises addressed in hand manipulation, speed with fine motor activities and more minute object manipulation. Pt with signficantly improved motor control related to coordination and emphasis now on in hand manipulation and speed.              Balance Exercises - 08/28/17 1431      Balance  Exercises: Standing   Standing Eyes Opened Wide (BOA);Head turns  10 reps horizontal and vertical   SLS with Vectors Solid surface;Upper extremity assist 1  heel taps   Balance Beam blue; single finger support; step off/on forward x 20   Tandem Gait Upper extremity support;4 reps   Retro Gait Upper extremity support;4 reps   Sidestepping Upper extremity support;2 reps   Turning Both   Other Standing Exercises anterior/posterior weight-shifting; bil x 10 reps each with emphasis on weight-shifting and heel strike           OT Education - 08/28/17 1549    Education provided Yes   Education Details driving recommendations   Person(s) Educated  Patient;Spouse   Methods Explanation;Handout   Comprehension Verbalized understanding             OT Long Term Goals - 08/28/17 1549      OT LONG TERM GOAL #1   Title Pt will be mod I with HEP for coordination for RUE - 09/07/2017   Status Achieved     OT LONG TERM GOAL #2   Title Pt will be able to write a sentence level with 100% legibility with AE prn   Status On-going     OT LONG TERM GOAL #3   Title Pt will demonstrate improved coordination as evidenced by decreasing time on 9 hole peg test by at least 20 seconds to assist in using RUE for fine motor tasks. (baseline= 1.10.18)   Status Achieved  08/28/2017 31.87     OT LONG TERM GOAL #4   Title Pt will be mod I for simple snack and beverage prep at ambulatory level   Status On-going     OT LONG TERM GOAL #5   Title Pt will be mod I for toilet transfers   Status On-going     OT LONG TERM GOAL #6   Title Pt and wife will verbalize understanding of driving eval recommendations   Status Achieved               Plan - 08/28/17 1550    Clinical Impression Statement Pt progressing toward goals. Pt with significant improvement in RUE coordination.    Rehab Potential Good   OT Frequency 2x / week   OT Duration 4 weeks   OT Treatment/Interventions Self-care/ADL training;Therapeutic exercise;Neuromuscular education;DME and/or AE instruction;Therapist, nutritional;Therapeutic activities;Patient/family education;Balance training   Plan check on upgraded HEP, in hand manipulation tasks with 2 or more items, speed with simple coordination tasks, cooking.    Consulted and Agree with Plan of Care Patient;Family member/caregiver   Family Member Consulted wife      Patient will benefit from skilled therapeutic intervention in order to improve the following deficits and impairments:  Decreased activity tolerance, Decreased balance, Decreased coordination, Decreased mobility, Decreased knowledge of use of DME, Impaired  UE functional use  Visit Diagnosis: Other lack of coordination  Unsteadiness on feet  Ataxia    Problem List Patient Active Problem List   Diagnosis Date Noted  . Acute blood loss anemia   . Labile blood glucose   . PAF (paroxysmal atrial fibrillation) ([place])   . Hypoalbuminemia due to protein-calorie malnutrition ([place])   . Diabetes mellitus ([place])   . Benign essential HTN   . Ataxia, post-stroke   . Acute cerebrovascular accident (CVA) of cerebellum ([place])   . Dizziness 07/25/2017  . Hypothyroidism 09/13/2016  . BMI 26.53,adult 08/28/2015  . Medicare annual wellness visit, subsequent 08/28/2015  . Diabetic  mononeuritis multiplex, right abdomen 06/02/2015  . Vitamin D deficiency 04/29/2014  . Medication management 01/03/2014  . 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 BPH (benign prostatic hyperplasia)   . Testosterone deficiency   . 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 Anemia 11/07/2013  . Hypertension 11/07/2013  . Hyperlipidemia 11/07/2013  . Paroxysmal atrial fibrillation ([place]) 09/18/2012    Quay Burow , OTR/L 08/28/2017, 3:55 PM  [facility] [address] [place], [place], [address] Phone: [phone]   Fax:  [phone]  Name: Earl [name] MRN: [id number] Date of Birth: 09-01-1941

## 2017-08-28 NOTE — Patient Instructions (Signed)
Local Driver Evaluation Programs: ° °Comprehensive Evaluation: includes clinical and in vehicle behind the wheel testing by OCCUPATIONAL THERAPIST. Programs have varying levels of adaptive controls available for trial.  ° °[facility] °[address] °[place], [place]  [address] °[phone] or [phone] °http://www.driver-rehab.com °Evaluator:  Cyndee Crompton, OT/CDRS/CDI/SCDCM/Low Vision Certification ° °[facility] °[address] °[place], [place] [address] °[phone] °https://www.[facility].org/home/services/rehabilitation.aspx °Evaluators:  Shannon Sheek, OT and Jill Tucker, OT ° °[facility] [place] [facility] (ONLY SERVES VETERANS!!) °[facility] °[address] °[place], [place]  [address] °[phone] °http://www.[place].va.gov/services/Physical_Medicine_Rehabilitation_Services.asp °Evaluators:  Eric Andrews, KT; Heidi Harris, KT;  Gary Whitaker, KT (KT=kiniesotherapist) ° ° °Clinical evaluations only:  Includes clinical testing, refers to other programs or local certified driving instructor for behind the wheel testing. ° °[facility] (outpatient Rehab) °[facility] °[address] °[place], [place] [address] °[phone] for scheduling °http://www.[facility].edu/Outpatient-Rehabilitation/Neurorehabilitation-Therapy.htm °Evaluators:  Kelly Lambeth, OT; Kate Phillips, OT ° °Other area clinical evaluators available upon request including [facility], [facility] and [facility]. ° ° °    Resource List °What is a Driver Evaluation: °Your Road Ahead - A Guide to Comprehensive Driving Evaluations °http://www.thehartford.com/resources/mature-market-excellence/publications-on-aging ° °Association for [employer] - Disability and Driving Fact Sheets °http://www.aded.net/?page=510 ° °Driving after a Brain  Injury: °[employer] °http://www.biausa.org/tbims-abstracts/if-there-is-an-effective-way-to-determine-if-someone-is-ready-to-drive-after-tbi?A=SearchResult&SearchID=9495675&ObjectID=2758842&ObjectType=35 ° °Driving with Adaptive Equipment: °[facility] Process °http://www.driver-rehab.com/adaptive-equipment ° °[employer] °http://www.nmeda.com/ ° ° ° ° ° ° °  °

## 2017-08-28 NOTE — Patient Instructions (Signed)
Bridge Pose    Lie on your back on the bed. Feet apart. Press small of back into mat and tighten your stomach muscles. Then lift your hips off the bed and count out loud to 5.   Repeat __10__ times. Do 2 sets.   Copyright  VHI. All rights reserved.

## 2017-08-29 NOTE — Progress Notes (Signed)
[facility] Summary Report / Loop Recorder 

## 2017-08-31 ENCOUNTER — Ambulatory Visit: Payer: Medicare Other | Admitting: Occupational Therapy

## 2017-08-31 ENCOUNTER — Ambulatory Visit: Payer: Medicare Other | Admitting: Physical Therapy

## 2017-08-31 ENCOUNTER — Encounter: Payer: Self-pay | Admitting: Physical Therapy

## 2017-08-31 DIAGNOSIS — R278 Other lack of coordination: Secondary | ICD-10-CM

## 2017-08-31 DIAGNOSIS — M6281 Muscle weakness (generalized): Secondary | ICD-10-CM | POA: Diagnosis not present

## 2017-08-31 DIAGNOSIS — R2681 Unsteadiness on feet: Secondary | ICD-10-CM

## 2017-08-31 DIAGNOSIS — R2689 Other abnormalities of gait and mobility: Secondary | ICD-10-CM

## 2017-08-31 DIAGNOSIS — R270 Ataxia, unspecified: Secondary | ICD-10-CM | POA: Diagnosis not present

## 2017-08-31 LAB — CUP PACEART REMOTE DEVICE CHECK
Date Time Interrogation Session: 20181007203535
Implantable Pulse Generator Implant Date: 20180907

## 2017-08-31 NOTE — Therapy (Signed)
[name] [address] [place], [place], [address] Phone: [phone]   Fax:  [phone]  Occupational Therapy Treatment  Patient Details  Name: Earl [name] MRN: [id number] Date of Birth: 1941/10/12 Referring Provider: Dr. Posey Pronto  Encounter Date: 08/31/2017      OT End of Session - 08/31/17 1323    Visit Number 4   Number of Visits 8   Date for OT Re-Evaluation 09/07/17   Authorization Type UHC Medicare - will need G code and PN every 10th visit   Authorization Time Period 60 days   Authorization - Visit Number 4   Authorization - Number of Visits 10   OT Start Time 1319   OT Stop Time 1400   OT Time Calculation (min) 41 min   Activity Tolerance Patient tolerated treatment well   Behavior During Therapy [facility] for tasks assessed/performed      Past Medical History:  Diagnosis Date  . BPH (benign prostatic hyperplasia)   . Diabetes mellitus ([place])   . Hyperlipidemia   . Hypertension   . Other testicular hypofunction   . Type II or unspecified type diabetes mellitus without mention of complication, not stated as uncontrolled     Past Surgical History:  Procedure Laterality Date  . [place] and 2004  . LOOP RECORDER INSERTION N/A 07/28/2017   Procedure: LOOP RECORDER INSERTION;  Surgeon: Deboraha Sprang, MD;  Location: [facility];  Service: Cardiovascular;  Laterality: N/A;  . TEE WITHOUT CARDIOVERSION N/A 07/28/2017   Procedure: TRANSESOPHAGEAL ECHOCARDIOGRAM (TEE);  Surgeon: Sueanne Margarita, MD;  Location: [facility] ENDOSCOPY;  Service: Cardiovascular;  Laterality: N/A;    There were no vitals filed for this visit.      Subjective Assessment - 08/31/17 1320    Subjective  HEP going well   Patient is accompained by: Family member  wife   Pertinent History Pt with R cerebellar stroke.  Check BP intermittent    Patient Stated Goals I want to get back to normal - use my R hand  and walk better.   Currently in Pain? No/denies                      OT Treatments/Exercises (OP) - 08/31/17 0001      ADLs   Writing Practiced writing name/address with min cueing for incr size.  Pt's size decr with fatigue.  Then writing grocery list and name/address with built-up grip with improved size overall.  100% legibility      Fine Motor Coordination   Fine Motor Coordination Grooved pegs;Manipulating coins;Dealing card with thumb;Small Pegboard;In hand manipuation training   In Hand Manipulation Training maniuplating 2 balls in hand with min-mod difficulty, min cueing given for speed particularly easier way.   Small Pegboard Functional reaching to place small pegs in vertical pegboard overhead using in-hand manipulation with min difficulty.     Dealing card with thumb with min difficulty    Manipulating coins picking up and manipulating 5 coins to place in coin back 1 at a time with min-mod difficulty/drops   Grooved pegs with min difficulty/cues to avoid compensation                     OT Long Term Goals - 08/28/17 1549      OT LONG TERM GOAL #1   Title Pt will be mod I with HEP for coordination for RUE - 09/07/2017  Status Achieved     OT LONG TERM GOAL #2   Title Pt will be able to write a sentence level with 100% legibility with AE prn   Status On-going     OT LONG TERM GOAL #3   Title Pt will demonstrate improved coordination as evidenced by decreasing time on 9 hole peg test by at least 20 seconds to assist in using RUE for fine motor tasks. (baseline= 1.10.18)   Status Achieved  08/28/2017 31.87     OT LONG TERM GOAL #4   Title Pt will be mod I for simple snack and beverage prep at ambulatory level   Status On-going     OT LONG TERM GOAL #5   Title Pt will be mod I for toilet transfers   Status On-going     OT LONG TERM GOAL #6   Title Pt and wife will verbalize understanding of driving eval recommendations   Status Achieved                Plan - 08/31/17 1324    Rehab Potential Good   OT Frequency 2x / week   OT Duration 4 weeks   OT Treatment/Interventions Self-care/ADL training;Therapeutic exercise;Neuromuscular education;DME and/or AE instruction;Therapist, nutritional;Therapeutic activities;Patient/family education;Balance training   Plan simple cooking task, continue with in-hand manipulation   Consulted and Agree with Plan of Care Patient;Family member/caregiver   Family Member Consulted wife      Patient will benefit from skilled therapeutic intervention in order to improve the following deficits and impairments:  Decreased activity tolerance, Decreased balance, Decreased coordination, Decreased mobility, Decreased knowledge of use of DME, Impaired UE functional use  Visit Diagnosis: Other lack of coordination  Unsteadiness on feet  Ataxia  Muscle weakness (generalized)    Problem List Patient Active Problem List   Diagnosis Date Noted  . Acute blood loss anemia   . Labile blood glucose   . PAF (paroxysmal atrial fibrillation) ([place])   . Hypoalbuminemia due to protein-calorie malnutrition ([place])   . Diabetes mellitus ([place])   . Benign essential HTN   . Ataxia, post-stroke   . Acute cerebrovascular accident (CVA) of cerebellum ([place])   . Dizziness 07/25/2017  . Hypothyroidism 09/13/2016  . BMI 26.53,adult 08/28/2015  . Medicare annual wellness visit, subsequent 08/28/2015  . 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 Vitamin D deficiency 04/29/2014  . Medication management 01/03/2014  . 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 BPH (benign prostatic hyperplasia)   . Testosterone deficiency   . 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 Anemia 11/07/2013  . Hypertension 11/07/2013  . Hyperlipidemia 11/07/2013  . Paroxysmal atrial fibrillation ([place]) 09/18/2012    [facility] 08/31/2017, 2:16 PM  [facility] [address] [place], [place], [address] Phone: [phone]   Fax:  [phone]  Name: Earl [name] MRN: [id number] Date of Birth: 04-17-1941   Vianne Bulls, OTR/L [facility] [address] [place], [place]  [address] [phone] phone [phone] 08/31/17 2:16 PM

## 2017-09-01 ENCOUNTER — Other Ambulatory Visit: Payer: Self-pay | Admitting: Internal Medicine

## 2017-09-03 NOTE — Therapy (Signed)
[facility] [address] [place], [place], [address] Phone: [phone]   Fax:  [phone]  Physical Therapy Treatment  Patient Details  Name: Earl [name] MRN: [id number] Date of Birth: [birth year] Referring Provider: Dellie Catholic)  Encounter Date: 08/31/2017   08/31/17 1404  PT Visits / Re-Eval  Visit Number 4  Number of Visits 18  Date for PT Re-Evaluation 10/09/17  Authorization  Authorization Type UHC Medicare-GCODE and progress note due every 10th visit  PT Time Calculation  PT Start Time 1402  PT Stop Time 1445  PT Time Calculation (min) 43 min  PT - End of Session  Equipment Utilized During Treatment Gait belt  Activity Tolerance Patient tolerated treatment well  Behavior During Therapy [facility] for tasks assessed/performed     Past Medical History:  Diagnosis Date  . BPH (benign prostatic hyperplasia)   . Diabetes mellitus ([place])   . Hyperlipidemia   . Hypertension   . Other testicular hypofunction   . Type II or unspecified type diabetes mellitus without mention of complication, not stated as uncontrolled     Past Surgical History:  Procedure Laterality Date  . [place] and 2004  . LOOP RECORDER INSERTION N/A 07/28/2017   Procedure: LOOP RECORDER INSERTION;  Surgeon: Deboraha Sprang, MD;  Location: [facility];  Service: Cardiovascular;  Laterality: N/A;  . TEE WITHOUT CARDIOVERSION N/A 07/28/2017   Procedure: TRANSESOPHAGEAL ECHOCARDIOGRAM (TEE);  Surgeon: Sueanne Margarita, MD;  Location: [facility] ENDOSCOPY;  Service: Cardiovascular;  Laterality: N/A;    There were no vitals filed for this visit.   08/31/17 1400  Symptoms/Limitations  Subjective No new complaints. No falls or pain to report.   Patient is accompained by: Family member (daughter and spouse)  Pertinent History DM, PAF, HTN  Patient Stated Goals Work on balance, walking and get back to  independent  Pain Assessment  Currently in Pain? No/denies  Pain Score 0      08/31/17 1405  Transfers  Transfers Sit to Stand;Stand to Sit  Sit to Stand 5: Supervision  Stand to Sit 5: Supervision  Number of Reps 10 reps;1 set  Comments arms across chest from/to low mat table with good anterior weight shifting and slow, controlled descent noted with each rep  Ambulation/Gait  Ambulation/Gait Yes  Ambulation/Gait Assistance 4: Min guard;4: Min assist  Ambulation/Gait Assistance Details cues needed for sequencing, cane placement/use ,step length and to relax right arm with gait.   Ambulation Distance (Feet) 115 Feet (x2 reps, 120 x1)  Assistive device Straight cane (with rubber quad tip)  Gait Pattern Step-through pattern;Decreased step length - right;Decreased step length - left;Wide base of support;Ataxic;Decreased weight shift to right  Ambulation Surface Level;Indoor  High Level Balance  High Level Balance Activities Marching forwards;Marching backwards;Tandem walking (tandem/toe/heel walking fwd/bwd)  High Level Balance Comments in parallel bars with light support with 1-2 UE's x 3 laps each way, min guard assist with cues on posture and ex form/technique.       08/31/17 1426  Balance Exercises: Standing  Standing Eyes Closed Narrow base of support (BOS);Head turns;Foam/compliant surface;Other reps (comment);30 secs;Limitations  Balance Beam standing across blue foam beam: alternating fwd stepping to floor/back onto beam x 10 each leg, then alternating bwd stepping to floor and back onto beam x 10 reps. single light UE support on bars with cues on posture, weight shifting and stance position in beam.   Balance Exercises:  Standing  Standing Eyes Closed Limitations on airex with feet together no UE support: EC no head movements, progressing to EC head movements with min assist for balance. cues on posture and weight shifting for balance assistance.            PT Short Term  Goals - 08/11/17 1615      PT SHORT TERM GOAL #1   Title Pt will be independent with HEP for improved balance, strength, and gait.  TARGET 09/08/17   Time 5   Period Weeks   Status New   Target Date 09/08/17     PT SHORT TERM GOAL #2   Title Pt will perform at least 6 of 10 reps of sit<>stand with minimal to no UE support for improved transfer efficiency and safety   Time 5   Period Weeks   Status New   Target Date 09/08/17     PT SHORT TERM GOAL #3   Title Pt will improve Berg Balance score to at least 34/56 for decreased fall risk.   Time 5   Period Weeks   Status New   Target Date 09/08/17     PT SHORT TERM GOAL #4   Title Pt will improve TUG score to less than or equal to 28 seconds for decreased fall risk.   Time 5   Period Weeks   Status New   Target Date 09/08/17     PT SHORT TERM GOAL #5   Title Pt will verbalize understanding of CVA warning signs and symptoms.   Time 5   Period Weeks   Status New   Target Date 09/08/17           PT Long Term Goals - 08/11/17 1618      PT LONG TERM GOAL #1   Title Pt/wife will verbalize understanding of fall prevention in the home environment.  TARGET 10/06/17   Time 9   Period Weeks   Status New   Target Date 10/06/17     PT LONG TERM GOAL #2   Title Pt will improve gait velocity to at least 1.8 ft/sec for decreased fall risk/improved efficiency and safety with gait.   Time 9   Period Weeks   Status New   Target Date 10/06/17     PT LONG TERM GOAL #3   Title Pt will improve Berg Balance score to at least 42/56 for decreased fall risk.   Time 9   Period Weeks   Status New   Target Date 10/06/17     PT LONG TERM GOAL #4   Title Pt will improve TUG score to less than or equal to 20 seconds for decreased fall risk.   Time 9   Period Weeks   Status New   Target Date 10/06/17     PT LONG TERM GOAL #5   Title Pt will ambulate at least 50-100 ft using cane, with supervision for improved independence with gait  with lesser assistive device.   Time 9   Period Weeks   Status New   Target Date 10/06/17        08/31/17 1404  Plan  Clinical Impression Statement Today's skilled session continued to focus on balance and strengthening. Also initiated gait traning with straight cane with in session improvement noted. Pt is progressing toward goals and should benefit from continued PT to progress toward unmet goals.                  Pt  will benefit from skilled therapeutic intervention in order to improve on the following deficits Abnormal gait;Decreased balance;Decreased mobility;Decreased strength;Difficulty walking  Rehab Potential Good  PT Frequency (1x/wk for 1 week, then 2x/wk for 8 weeks)  PT Duration Other (comment) (POC = 9 weeks)  PT Treatment/Interventions ADLs/Self Care Home Management;Functional mobility training;Gait training;Therapeutic activities;DME Instruction;Therapeutic exercise;Balance training;Neuromuscular re-education;Patient/family education;Orthotic Fit/Training  PT Next Visit Plan add to HEP for strengthening and balance as approp, gait training with rolling walker vs no device vs ?cane; CVA education  Consulted and Agree with Plan of Care Patient;Family member/caregiver       Patient will benefit from skilled therapeutic intervention in order to improve the following deficits and impairments:  Abnormal gait, Decreased balance, Decreased mobility, Decreased strength, Difficulty walking  Visit Diagnosis: Unsteadiness on feet  Muscle weakness (generalized)  Other abnormalities of gait and mobility     Problem List Patient Active Problem List   Diagnosis Date Noted  . Acute blood loss anemia   . Labile blood glucose   . PAF (paroxysmal atrial fibrillation) ([place])   . Hypoalbuminemia due to protein-calorie malnutrition ([place])   . Diabetes mellitus ([place])   . Benign essential HTN   . Ataxia, post-stroke   . Acute cerebrovascular accident (CVA) of cerebellum ([place])   .  Dizziness 07/25/2017  . Hypothyroidism 09/13/2016  . BMI 26.53,adult 08/28/2015  . Medicare annual wellness visit, subsequent 08/28/2015  . 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 Vitamin D deficiency 04/29/2014  . Medication management 01/03/2014  . 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 BPH (benign prostatic hyperplasia)   . Testosterone deficiency   . 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 Anemia 11/07/2013  . Hypertension 11/07/2013  . Hyperlipidemia 11/07/2013  . Paroxysmal atrial fibrillation ([facility]) 09/18/2012    Willow Ora, PTA, [facility] [address] [place], [place] [address] [phone] 09/03/17, 12:28 AM   Name: Earl [name] MRN: [id number] Date of Birth: January 16, 1941

## 2017-09-04 ENCOUNTER — Encounter: Payer: Self-pay | Admitting: Neurology

## 2017-09-04 ENCOUNTER — Ambulatory Visit: Payer: Medicare Other | Admitting: Physical Therapy

## 2017-09-04 ENCOUNTER — Ambulatory Visit: Payer: Medicare Other | Admitting: Occupational Therapy

## 2017-09-04 ENCOUNTER — Ambulatory Visit (INDEPENDENT_AMBULATORY_CARE_PROVIDER_SITE_OTHER): Payer: Medicare Other | Admitting: Neurology

## 2017-09-04 ENCOUNTER — Encounter: Payer: Self-pay | Admitting: Occupational Therapy

## 2017-09-04 VITALS — BP 148/89 | HR 95 | Ht 71.0 in | Wt 182.6 lb

## 2017-09-04 DIAGNOSIS — E785 Hyperlipidemia, unspecified: Secondary | ICD-10-CM

## 2017-09-04 DIAGNOSIS — R2681 Unsteadiness on feet: Secondary | ICD-10-CM | POA: Diagnosis not present

## 2017-09-04 DIAGNOSIS — Z7901 Long term (current) use of anticoagulants: Secondary | ICD-10-CM | POA: Insufficient documentation

## 2017-09-04 DIAGNOSIS — I480 Paroxysmal atrial fibrillation: Secondary | ICD-10-CM

## 2017-09-04 DIAGNOSIS — R270 Ataxia, unspecified: Secondary | ICD-10-CM | POA: Diagnosis not present

## 2017-09-04 DIAGNOSIS — R2689 Other abnormalities of gait and mobility: Secondary | ICD-10-CM

## 2017-09-04 DIAGNOSIS — M6281 Muscle weakness (generalized): Secondary | ICD-10-CM

## 2017-09-04 DIAGNOSIS — E1159 Type 2 diabetes mellitus with other circulatory complications: Secondary | ICD-10-CM | POA: Diagnosis not present

## 2017-09-04 DIAGNOSIS — I63441 Cerebral infarction due to embolism of right cerebellar artery: Secondary | ICD-10-CM

## 2017-09-04 DIAGNOSIS — R278 Other lack of coordination: Secondary | ICD-10-CM | POA: Diagnosis not present

## 2017-09-04 DIAGNOSIS — I10 Essential (primary) hypertension: Secondary | ICD-10-CM

## 2017-09-04 NOTE — Therapy (Signed)
[facility] [address] [place], [place], [address] Phone: [phone]   Fax:  [phone]  Occupational Therapy Treatment  Patient Details  Name: Earl [name] MRN: [id number] Date of Birth: [birth year] Referring Provider: Dr. Posey Pronto  Encounter Date: 09/04/2017      OT End of Session - 09/04/17 1447    Visit Number 5   Number of Visits 8   Date for OT Re-Evaluation 09/07/17   Authorization Type UHC Medicare - will need G code and PN every 10th visit   Authorization Time Period 60 days   Authorization - Visit Number 5   Authorization - Number of Visits 10   OT Start Time 1402   OT Stop Time 1445   OT Time Calculation (min) 43 min   Activity Tolerance Patient tolerated treatment well      Past Medical History:  Diagnosis Date  . BPH (benign prostatic hyperplasia)   . Diabetes mellitus ([place])   . Hyperlipidemia   . Hypertension   . Other testicular hypofunction   . Type II or unspecified type diabetes mellitus without mention of complication, not stated as uncontrolled     Past Surgical History:  Procedure Laterality Date  . [place] and 2004  . LOOP RECORDER INSERTION N/A 07/28/2017   Procedure: LOOP RECORDER INSERTION;  Surgeon: Deboraha Sprang, MD;  Location: [facility];  Service: Cardiovascular;  Laterality: N/A;  . TEE WITHOUT CARDIOVERSION N/A 07/28/2017   Procedure: TRANSESOPHAGEAL ECHOCARDIOGRAM (TEE);  Surgeon: Sueanne Margarita, MD;  Location: [facility] ENDOSCOPY;  Service: Cardiovascular;  Laterality: N/A;    There were no vitals filed for this visit.      Subjective Assessment - 09/04/17 1408    Subjective  I haven't had any falls.    Pertinent History Pt with R cerebellar stroke.  Check BP intermittent    Patient Stated Goals I want to get back to normal - use my R hand and walk better.   Currently in Pain? No/denies                      OT  Treatments/Exercises (OP) - 09/04/17 0001      ADLs   Cooking Addressed simple bev and snack prep at ambulatory level with walker.  Pt able to complete with distant supervision.  Discussed use of cups with lids, food bags and using water bottles for pt to be able to use walker bag to move items. Pt verbalized understanding. Pt did not cook before CVA however was able to get simple snack and beverage.. Pt reports that he did occassional make grits and was able to make them for the first time this past week.      Fine Motor Coordination   Grooved pegs d Grooved peg board with min difficulty - greatly improve since last time.  Also addressed in hand manipulation with 2 items - pt with moderate diffculty however performance improves with practice and repetition. Progressed to mnaipulation of 3 small items in hand.  Max difficulty and signficantly increased time.               Balance Exercises - 09/04/17 1417      Balance Exercises: Standing   Tandem Gait Forward;Retro;Upper extremity support;2 reps   Partial Tandem Stance Eyes open;Intermittent upper extremity support;3 reps;10 secs  Cues for glut activation, upright posture   Sidestepping Upper extremity support;4 reps   Marching Limitations  Marching in place x 10 reps with 1 UE support   Heel Raises Limitations x 10 reps   Toe Raise Limitations x 10 reps                OT Long Term Goals - 09/04/17 1445      OT LONG TERM GOAL #1   Title Pt will be mod I with HEP for coordination for RUE - 09/07/2017   Status Achieved     OT LONG TERM GOAL #2   Title Pt will be able to write a sentence level with 100% legibility with AE prn   Status On-going     OT LONG TERM GOAL #3   Title Pt will demonstrate improved coordination as evidenced by decreasing time on 9 hole peg test by at least 20 seconds to assist in using RUE for fine motor tasks. (baseline= 1.10.18)   Status Achieved  08/28/2017 31.87     OT LONG TERM GOAL #4    Title Pt will be mod I for simple snack and beverage prep at ambulatory level   Status Achieved     OT LONG TERM GOAL #5   Title Pt will be mod I for toilet transfers   Status Achieved     OT LONG TERM GOAL #6   Title Pt and wife will verbalize understanding of driving eval recommendations   Status Achieved               Plan - 09/04/17 1446    Clinical Impression Statement Pt progressing toward goals - pt with improvement in RUE functional use as well as balance with RW   Rehab Potential Good   OT Frequency 2x / week   OT Duration 4 weeks   OT Treatment/Interventions Self-care/ADL training;Therapeutic exercise;Neuromuscular education;DME and/or AE instruction;Therapist, nutritional;Therapeutic activities;Patient/family education;Balance training   Plan in hand manipulation, bilateral tasks.   Consulted and Agree with Plan of Care Patient      Patient will benefit from skilled therapeutic intervention in order to improve the following deficits and impairments:  Decreased activity tolerance, Decreased balance, Decreased coordination, Decreased mobility, Decreased knowledge of use of DME, Impaired UE functional use  Visit Diagnosis: Muscle weakness (generalized)  Unsteadiness on feet  Other lack of coordination  Ataxia    Problem List Patient Active Problem List   Diagnosis Date Noted  . Chronic anticoagulation 09/04/2017  . Acute blood loss anemia   . Labile blood glucose   . PAF (paroxysmal atrial fibrillation) ([place])   . Hypoalbuminemia due to protein-calorie malnutrition ([place])   . Diabetes mellitus ([place])   . Benign essential HTN   . Ataxia, post-stroke   . 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 Dizziness 07/25/2017  . Hypothyroidism 09/13/2016  . BMI 26.53,adult 08/28/2015  . Medicare annual wellness visit, subsequent 08/28/2015  . 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 Vitamin D  deficiency 04/29/2014  . Medication management 01/03/2014  . 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 BPH (benign prostatic hyperplasia)   . Testosterone deficiency   . 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 Anemia 11/07/2013  . Essential hypertension 11/07/2013  . Hyperlipidemia 11/07/2013  . Paroxysmal atrial fibrillation ([place]) 09/18/2012    Sherryll Burger 09/04/2017, 2:48 PM  [facility] [address] [place], [place], [address] Phone: [phone]   Fax:  [phone]  Name: Earl [name] MRN: [id number]  Date of Birth: Dec 07, 1940

## 2017-09-04 NOTE — Progress Notes (Signed)
STROKE NEUROLOGY FOLLOW UP NOTE  NAME: Earl [name] DOB: 1941-10-04  REASON FOR VISIT: stroke follow up HISTORY FROM: pt and wife and chart  Today we had the pleasure of seeing Earl [name] in follow-up at our Neurology Clinic. Pt was accompanied by wife.   History Summary Earl [name] is a 76 y.o. male with history of diabetes and hypertension who presented with nausea and dizziness. 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 CTA head and neck unremarkable. EF 55-60%. LDL 87 and A1c 6.6. Patient had history of questionable A. fib episode during colonoscopy in the past, had 30 day cardiac event monitoring showed no A. Fib. TEE and remarkable and Loop recorder placed. Discharge to CIR with aspirin and Plavix as well as Lipitor.  Interval History During the interval time, the patient has been doing well. While he was in CIR, he was found to have afib/aflutter episode on loop recorder. He was put on eliquis 5mg  bid and DAPT discontinued. Pt doing well still at outpt PT/OT for right sided ataxia. BP good at home, today in clinic 148/89. He did not check finger stick at home but check urine glucose always negative. Continued on eliquis and lipitor without side effects.   REVIEW OF SYSTEMS: Full 14 system review of systems performed and notable only for those listed below and in HPI above, all others are negative:  Constitutional:   Cardiovascular:  Ear/Nose/Throat:   Skin:  Eyes:   Respiratory:   Gastroitestinal:   Genitourinary:  Hematology/Lymphatic:   Endocrine:  Musculoskeletal:   Allergy/Immunology:   Neurological:  dizziness Psychiatric:  Sleep:   The following represents the patient's updated allergies and side effects list: Allergies  Allergen Reactions  . Penicillins Other (See Comments)    The neurologically relevant items on the patient's problem list were reviewed on today's  visit.  Neurologic Examination  A problem focused neurological exam (12 or more points of the single system neurologic examination, vital signs counts as 1 point, cranial nerves count for 8 points) was performed.  Blood pressure (!) 148/89, pulse 95, height 5\' 11"  (1.803 m), weight 182 lb 9.6 oz (82.8 kg).  General - Well nourished, well developed, in no apparent distress.  Ophthalmologic - Sharp disc margins OU.  Cardiovascular - Regular rate and rhythm with no murmur.  Mental Status -  Level of arousal and orientation to time, place, and person were intact. Language including expression, naming, repetition, comprehension was assessed and found intact. Fund of Knowledge was assessed and was intact.  Cranial Nerves II - XII - II - Visual field intact OU. III, IV, VI - Extraocular movements intact. V - Facial sensation intact bilaterally. VII - Facial movement intact bilaterally. VIII - Hearing & vestibular intact bilaterally, no nystagmus. X - Palate elevates symmetrically. XI - Chin turning & shoulder shrug intact bilaterally. XII - Tongue protrusion intact.  Motor Strength - The patient's strength was normal in all extremities and pronator drift was absent.  Bulk was normal and fasciculations were absent.   Motor Tone - Muscle tone was assessed at the neck and appendages and was normal.  Reflexes - The patient's reflexes were 1+ in all extremities and he had no pathological reflexes.  Sensory - Light touch, temperature/pinprick, vibration and proprioception, and Romberg testing were assessed and were normal.    Coordination - The patient had dysmetria at RUE dysmetria and RLE ataxia.  Tremor was absent.  Gait and Station -  walk with walker, slow gait, but stable, no truncal ataxia.   Functional score  mRS = 2   0 - No symptoms.   1 - No significant disability. Able to carry out all usual activities, despite some symptoms.   2 - Slight disability. Able to look after  own affairs without assistance, but unable to carry out all previous activities.   3 - Moderate disability. Requires some help, but able to walk unassisted.   4 - Moderately severe disability. Unable to attend to own bodily needs without assistance, and unable to walk unassisted.   5 - Severe disability. Requires constant nursing care and attention, bedridden, incontinent.   6 - Dead.   NIH Stroke Scale   Level Of Consciousness 0=Alert; keenly responsive 1=Not alert, but arousable by minor stimulation 2=Not alert, requires repeated stimulation 3=Responds only with reflex movements 0  LOC Questions to Month and Age 74=Answers both questions correctly 1=Answers one question correctly 2=Answers neither question correctly 0  LOC Commands      -Open/Close eyes     -Open/close grip 0=Performs both tasks correctly 1=Performs one task correctly 2=Performs neighter task correctly 0  Best Gaze 0=Normal 1=Partial gaze palsy 2=Forced deviation, or total gaze paresis 0  Visual 0=No visual loss 1=Partial hemianopia 2=Complete hemianopia 3=Bilateral hemianopia (blind including cortical blindness) 0  Facial Palsy 0=Normal symmetrical movement 1=Minor paralysis (asymmetry) 2=Partial paralysis (lower face) 3=Complete paralysis (upper and lower face) 0  Motor  0=No drift, limb holds posture for full 10 seconds 1=Drift, limb holds posture, no drift to bed 2=Some antigravity effort, cannot maintain posture, drifts to bed 3=No effort against gravity, limb falls 4=No movement Right Arm 0     Leg 0    Left Arm 0     Leg 0  Limb Ataxia 0=Absent 1=Present in one limb 2=Present in two limbs 2  Sensory 0=Normal 1=Mild to moderate sensory loss 2=Severe to total sensory loss 0  Best Language 0=No aphasia, normal 1=Mild to moderate aphasia 2=Mute, global aphasia 3=Mute, global aphasia 0  Dysarthria 0=Normal 1=Mild to moderate 2=Severe, unintelligible or mute/anarthric 0  Extinction/Neglect  0=No abnormality 1=Extinction to bilateral simultaneous stimulation 2=Profound neglect 0  Total   2     Data reviewed: I personally reviewed the images and agree with the radiology interpretations.  Ct Angio Head W Or Wo Contrast Ct Angio Neck W Or Wo Contrast 07/26/2017 IMPRESSION: 1. Patent carotid and vertebral arteries. No dissection, aneurysm, or hemodynamically significant stenosis utilizing NASCET criteria. 2. Patent circle of Willis. No large vessel occlusion, aneurysm, or significant stenosis. 3. Right AICA distribution late acute/early subacute infarction. 4. No hemorrhage or abnormal enhancement in the brain. 5. Lipoma within strap muscles anterior to larynx. 6. Large nodule in left lobe of thyroid, stable from prior thyroid ultrasounds.  Ct Head Wo Contrast 07/25/2017 IMPRESSION: 1. Acute right cerebellar SCA territory infarct. 2. No intracranial hemorrhage.  Mr Brain Wo Contrast 07/26/2017 IMPRESSION: 1. Acute ischemic superior right cerebellar infarct without significant mass effect. Associated mild petechial hemorrhage without frank hemorrhagic transformation. 2. Otherwise normal brain MRI.  TTE 07/26/2017 Study Conclusions - Left ventricle: The cavity size was normal. Wall thickness was increased in a pattern of mild LVH. Systolic function was normal. The estimated ejection fraction was in the range of 55% to 60%. Wall motion was normal; there were no regional wall motion abnormalities. Features are consistent with a pseudonormal left ventricular filling pattern, with concomitant abnormal relaxation and increased filling pressure (grade 2 diastolic  dysfunction). - Aortic valve: There was mild regurgitation. - Mitral valve: There was mild regurgitation. - Right ventricle: The cavity size was mildly dilated. - Pulmonary arteries: Systolic pressure was mildly increased. PA peak pressure: 39 mm Hg (S). Impressions: - Normal LV systolic function;  moderate diastolic dysfunction; mild LVH; mild AI; mild MR; mild RVE; mild TR with mildly elevated pulmonary pressure.  TEE 07/28/17 normal  Component     Latest Ref Rng & Units 07/26/2017  Cholesterol     0 - 200 mg/dL 147  Triglycerides     <150 mg/dL 45  HDL Cholesterol     >40 mg/dL 51  Total CHOL/HDL Ratio     RATIO 2.9  VLDL     0 - 40 mg/dL 9  LDL (calc)     0 - 99 mg/dL 87  Hemoglobin A1C     4.8 - 5.6 % 6.7 (H)  Mean Plasma Glucose     mg/dL 146    Assessment: As you may recall, he is a 76 y.o. 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 CTA head and neck unremarkable. EF 55-60%. LDL 87 and A1c 6.6. Patient had history of questionable A. fib episode during colonoscopy in the past, had 30 day cardiac event monitoring showed no A. Fib. TEE and remarkable and Loop recorder placed. Discharge to CIR with aspirin and Plavix as well as Lipitor. He was found to have afib/aflutter episode on loop recorder while in CIR. He was put on eliquis 5mg  bid and DAPT discontinued. Pt doing well still at outpt PT/OT for right sided ataxia.    Plan:  - continue eliquis and lipitor for stroke prevention  - check BP and glucose at home and record - Follow up with your primary care physician for stroke risk factor modification. Recommend maintain blood pressure goal <130/80, diabetes with hemoglobin A1c goal below 7.0% and lipids with LDL cholesterol goal below 70 mg/dL.  - continue outpt PT/OT and work hard with therapy - diabetic diet and home exercise - follow up in 4 months.   I spent more than 25 minutes of face to face time with the patient. Greater than 50% of time was spent in counseling and coordination of care. We discussed medication compliance, continue PT/OT, check BP and glucose at home.  No orders of the defined types were placed in this  encounter.   No orders of the defined types were placed in this encounter.   Patient Instructions  - continue eliquis and lipitor for stroke prevention  - check BP and glucose at home and record - Follow up with your primary care physician for stroke risk factor modification. Recommend maintain blood pressure goal <130/80, diabetes with hemoglobin A1c goal below 7.0% and lipids with LDL cholesterol goal below 70 mg/dL.  - continue outpt PT/OT and work hard with therapy - diabetic diet and home exercise - follow up in 4 months.    Rosalin Hawking, MD PhD [facility] [address] [place], [place] [address] [phone]

## 2017-09-04 NOTE — Therapy (Signed)
[facility] [address] [place], [place], [address] Phone: [phone]   Fax:  [phone]  Physical Therapy Treatment  Patient Details  Name: Earl [name] MRN: [id number] Date of Birth: 05/27/1941 Referring Provider: Dellie Catholic)  Encounter Date: 09/04/2017      PT End of Session - 09/04/17 1421    Visit Number 5   Number of Visits 18   Date for PT Re-Evaluation 10/09/17   Authorization Type [employer] Medicare-GCODE and progress note due every 10th visit   PT Start Time 1317   PT Stop Time 1406   PT Time Calculation (min) 49 min   Equipment Utilized During Treatment Gait belt   Activity Tolerance Patient tolerated treatment well   Behavior During Therapy [facility] for tasks assessed/performed      Past Medical History:  Diagnosis Date  . BPH (benign prostatic hyperplasia)   . Diabetes mellitus ([place])   . Hyperlipidemia   . Hypertension   . Other testicular hypofunction   . Type II or unspecified type diabetes mellitus without mention of complication, not stated as uncontrolled     Past Surgical History:  Procedure Laterality Date  . [place] and 2004  . LOOP RECORDER INSERTION N/A 07/28/2017   Procedure: LOOP RECORDER INSERTION;  Surgeon: Deboraha Sprang, MD;  Location: [place] [facility];  Service: Cardiovascular;  Laterality: N/A;  . TEE WITHOUT CARDIOVERSION N/A 07/28/2017   Procedure: TRANSESOPHAGEAL ECHOCARDIOGRAM (TEE);  Surgeon: Sueanne Margarita, MD;  Location: [facility] ENDOSCOPY;  Service: Cardiovascular;  Laterality: N/A;    There were no vitals filed for this visit.      Subjective Assessment - 09/04/17 1317    Subjective Had a doctor visit with Dr. Erlinda Hong this morning.  I'm tired from waiting for the appointment.   Patient is accompained by: Family member  daughter and spouse   Pertinent History DM, PAF, HTN   Patient Stated Goals Work on balance, walking and get back to  independent                         [facility] Adult PT Treatment/Exercise - 09/04/17 0001      Transfers   Transfers Sit to Stand;Stand to Sit   Sit to Stand 5: Supervision   Sit to Stand Details Verbal cues for technique   Stand to Sit 5: Supervision   Stand to Sit Details Initial cues for slowed descent   Number of Reps 10 reps;1 set  from 20" mat surface   Comments 5 reps from 18" chair with relaxed arms by sides     Ambulation/Gait   Ambulation/Gait Yes   Ambulation/Gait Assistance 4: Min guard;4: Min assist   Ambulation/Gait Assistance Details cues needed for sequencing, transition to step-through pattern, cane placement, relaxed RUE with gait   Ambulation Distance (Feet) 115 Feet  10 ft, then 70 ft (with cane); 60 ft x 2 with RW   Assistive device Straight cane  with rubber quad tip; 60 ft x 2 with RW   Gait Pattern Step-through pattern;Decreased step length - right;Decreased step length - left;Wide base of support;Ataxic;Decreased weight shift to right;Step-to pattern  Cues to transition to step through pattern   Ambulation Surface Level;Indoor     Exercises   Exercises Knee/Hip     Knee/Hip Exercises: Stretches   Active Hamstring Stretch Right;Left;2 reps;30 seconds  seated with leg extended in front of patient  Gastroc Stretch Right;Left;2 reps;30 seconds  using gait belt as strap in sitting   Other Knee/Hip Stretches standing gastroc stretch, foot propped on 2" block, UE support at counter, 2 reps 30 seconds each leg.     Knee/Hip Exercises: Aerobic   Stepper Seated SciFit Stepper, Level 1.5, 4 extremities x 6 minutes (RPM initially 40>90)             Balance Exercises - 09/04/17 1417      Balance Exercises: Standing   Tandem Gait Forward;Retro;Upper extremity support;2 reps   Partial Tandem Stance Eyes open;Intermittent upper extremity support;3 reps;10 secs  Cues for glut activation, upright posture   Sidestepping Upper extremity  support;4 reps   Marching Limitations Marching in place x 10 reps with 1 UE support   Heel Raises Limitations x 10 reps   Toe Raise Limitations x 10 reps           PT Education - 09/04/17 1421    Education provided Yes   Education Details HEP additions-sit<>stand and lower extremity stretches   Person(s) Educated Patient   Methods Explanation;Demonstration;Handout   Comprehension Verbalized understanding;Returned demonstration;Verbal cues required;Need further instruction          PT Short Term Goals - 08/11/17 1615      PT SHORT TERM GOAL #1   Title Pt will be independent with HEP for improved balance, strength, and gait.  TARGET 09/08/17   Time 5   Period Weeks   Status New   Target Date 09/08/17     PT SHORT TERM GOAL #2   Title Pt will perform at least 6 of 10 reps of sit<>stand with minimal to no UE support for improved transfer efficiency and safety   Time 5   Period Weeks   Status New   Target Date 09/08/17     PT SHORT TERM GOAL #3   Title Pt will improve Berg Balance score to at least 34/56 for decreased fall risk.   Time 5   Period Weeks   Status New   Target Date 09/08/17     PT SHORT TERM GOAL #4   Title Pt will improve TUG score to less than or equal to 28 seconds for decreased fall risk.   Time 5   Period Weeks   Status New   Target Date 09/08/17     PT SHORT TERM GOAL #5   Title Pt will verbalize understanding of CVA warning signs and symptoms.   Time 5   Period Weeks   Status New   Target Date 09/08/17           PT Long Term Goals - 08/11/17 1618      PT LONG TERM GOAL #1   Title Pt/wife will verbalize understanding of fall prevention in the home environment.  TARGET 10/06/17   Time 9   Period Weeks   Status New   Target Date 10/06/17     PT LONG TERM GOAL #2   Title Pt will improve gait velocity to at least 1.8 ft/sec for decreased fall risk/improved efficiency and safety with gait.   Time 9   Period Weeks   Status New    Target Date 10/06/17     PT LONG TERM GOAL #3   Title Pt will improve Berg Balance score to at least 42/56 for decreased fall risk.   Time 9   Period Weeks   Status New   Target Date 10/06/17     PT LONG TERM  GOAL #4   Title Pt will improve TUG score to less than or equal to 20 seconds for decreased fall risk.   Time 9   Period Weeks   Status New   Target Date 10/06/17     PT LONG TERM GOAL #5   Title Pt will ambulate at least 50-100 ft using cane, with supervision for improved independence with gait with lesser assistive device.   Time 9   Period Weeks   Status New   Target Date 10/06/17               Plan - 09/04/17 1422    Clinical Impression Statement Skilled PT session focused today on lower extremity strengthening and stretching, due to quivering noted in RLE with partial tandem stance work (unsure if quivering is due to muscle fatigue or quick stretch of gastrocs with RLE in posterior position).  Also continued to work on gait training with cane, with pt initiating gait with step-to pattern, and able to transition to (guarded) step through pattern after 60 ft.  Pt will continue to benefit from skilled PT to address balance, strength and gait for improved functional mobility and independence.   Rehab Potential Good   PT Frequency --  1x/wk for 1 week, then 2x/wk for 8 weeks   PT Duration Other (comment)  POC = 9 weeks   PT Treatment/Interventions ADLs/Self Care Home Management;Functional mobility training;Gait training;Therapeutic activities;DME Instruction;Therapeutic exercise;Balance training;Neuromuscular re-education;Patient/family education;Orthotic Fit/Training   PT Next Visit Plan Review HEP given this visit; gait training with cane vs RW; CVA education; continue balance training/hip stability work.  Need to check STGs (next week) and discuss adding additional appointments   Consulted and Agree with Plan of Care Patient      Patient will benefit from skilled  therapeutic intervention in order to improve the following deficits and impairments:  Abnormal gait, Decreased balance, Decreased mobility, Decreased strength, Difficulty walking  Visit Diagnosis: Muscle weakness (generalized)  Unsteadiness on feet  Other abnormalities of gait and mobility     Problem List Patient Active Problem List   Diagnosis Date Noted  . Chronic anticoagulation 09/04/2017  . Acute blood loss anemia   . Labile blood glucose   . PAF (paroxysmal atrial fibrillation) ([place])   . Hypoalbuminemia due to protein-calorie malnutrition ([place])   . Diabetes mellitus ([place])   . Benign essential HTN   . Ataxia, post-stroke   . 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 Dizziness 07/25/2017  . Hypothyroidism 09/13/2016  . BMI 26.53,adult 08/28/2015  . Medicare annual wellness visit, subsequent 08/28/2015  . 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 Vitamin D deficiency 04/29/2014  . Medication management 01/03/2014  . 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 BPH (benign prostatic hyperplasia)   . Testosterone deficiency   . 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 Anemia 11/07/2013  . Essential hypertension 11/07/2013  . Hyperlipidemia 11/07/2013  . Paroxysmal atrial fibrillation ([place]) 09/18/2012    MARRIOTT,AMY W. 09/04/2017, 2:28 PM  Frazier Butt., PT   [facility] [address] [place], [place], [address] Phone: [phone]   Fax:  [phone]  Name: Bailee Thall MRN: [id number] Date of Birth: Dec 22, 1940

## 2017-09-04 NOTE — Patient Instructions (Addendum)
HIP / KNEE: Extension - Sit to Stand    Sitting, place your feet shoulder width apart and tuck your feet slightly behind your knees.  Lean chest forward, raise hips up from surface. Straighten hips and knees. Weight bear equally on left and right sides. Backs of legs should not push off surface. _10__ reps per set, _1-2__ sets per day.  Copyright  VHI. All rights reserved.    HIP: Hamstrings - Short Sitting    Scoot out to edge of chair and sit tall.  Rest leg on the floor or a raised surface. Keep knee straight. Lift chest. Hold _30__ seconds. __3_ reps per set, _1-2_ sets per day  Copyright  VHI. All rights reserved.  ANKLE: Dorsiflexion - Sitting    Sitting, place strap/towel/belt around foot. Pull foot toward body, keeping heel on floor. Keep foot straight. You should feel the stretch in your lower leg.  Hold _30__ seconds. _3__ reps per set, _1-2__ sets per day.  Copyright  VHI. All rights reserved.  ANKLE: Dorsiflexion (Propped against 2" book or step)    Stand on the ground and prop your right foot against a 2" book or block.  Your heel should be resting on the ground and knee straight, shift your hips forward until you feel a gentle stretch in the lower leg.  Hold _30__ seconds. _3__ reps per set, 1-2___ sets per day  Copyright  VHI. All rights reserved.

## 2017-09-04 NOTE — Patient Instructions (Signed)
-   continue eliquis and lipitor for stroke prevention  - check BP and glucose at home and record - Follow up with your primary care physician for stroke risk factor modification. Recommend maintain blood pressure goal <130/80, diabetes with hemoglobin A1c goal below 7.0% and lipids with LDL cholesterol goal below 70 mg/dL.  - continue outpt PT/OT and work hard with therapy - diabetic diet and home exercise - follow up in 4 months.

## 2017-09-06 ENCOUNTER — Telehealth: Payer: Self-pay | Admitting: Cardiology

## 2017-09-06 NOTE — Telephone Encounter (Signed)
Spoke w/ pt and requested that he send a manual transmission b/c his home monitor has not updated in at least 14 days.   

## 2017-09-07 ENCOUNTER — Encounter: Payer: Self-pay | Admitting: Occupational Therapy

## 2017-09-07 ENCOUNTER — Ambulatory Visit: Payer: Medicare Other | Admitting: Occupational Therapy

## 2017-09-07 ENCOUNTER — Ambulatory Visit: Payer: Medicare Other | Admitting: Physical Therapy

## 2017-09-07 DIAGNOSIS — R278 Other lack of coordination: Secondary | ICD-10-CM

## 2017-09-07 DIAGNOSIS — R2681 Unsteadiness on feet: Secondary | ICD-10-CM

## 2017-09-07 DIAGNOSIS — R270 Ataxia, unspecified: Secondary | ICD-10-CM | POA: Diagnosis not present

## 2017-09-07 DIAGNOSIS — R2689 Other abnormalities of gait and mobility: Secondary | ICD-10-CM | POA: Diagnosis not present

## 2017-09-07 DIAGNOSIS — M6281 Muscle weakness (generalized): Secondary | ICD-10-CM

## 2017-09-07 NOTE — Therapy (Signed)
[facility] [address] [place], [place], [address] Phone: [phone]   Fax:  [phone]  Occupational Therapy Treatment  Patient Details  Name: Earl [name] MRN: [id number] Date of Birth: 1941-01-29 Referring Provider: Dr. Posey Pronto  Encounter Date: 09/07/2017      OT End of Session - 09/07/17 1542    Visit Number 6   Number of Visits 8   Date for OT Re-Evaluation 09/07/17   Authorization Type UHC Medicare - will need G code and PN every 10th visit   Authorization Time Period 60 days   Authorization - Visit Number 6   Authorization - Number of Visits 10   OT Start Time 1446   OT Stop Time 1533   OT Time Calculation (min) 47 min   Activity Tolerance Patient tolerated treatment well      Past Medical History:  Diagnosis Date  . BPH (benign prostatic hyperplasia)   . Diabetes mellitus ([place])   . Hyperlipidemia   . Hypertension   . Other testicular hypofunction   . Type II or unspecified type diabetes mellitus without mention of complication, not stated as uncontrolled     Past Surgical History:  Procedure Laterality Date  . [place] and 2004  . LOOP RECORDER INSERTION N/A 07/28/2017   Procedure: LOOP RECORDER INSERTION;  Surgeon: Deboraha Sprang, MD;  Location: [facility];  Service: Cardiovascular;  Laterality: N/A;  . TEE WITHOUT CARDIOVERSION N/A 07/28/2017   Procedure: TRANSESOPHAGEAL ECHOCARDIOGRAM (TEE);  Surgeon: Sueanne Margarita, MD;  Location: [facility];  Service: Cardiovascular;  Laterality: N/A;    There were no vitals filed for this visit.      Subjective Assessment - 09/07/17 1449    Subjective  I can't believe I could write that good   Pertinent History Pt with R cerebellar stroke.  Check BP intermittent    Patient Stated Goals I want to get back to normal - use my R hand and walk better.   Currently in Pain? No/denies                       OT Treatments/Exercises (OP) - 09/07/17 0001      ADLs   Writing Addressed writing with pt. Pt able to write at 2 sentence level with mod cues for thinking "BIG" for letters and using "STOP" strategy for brief pause between letters.  WIth mod cues, pt with 100% legibility. Pt required significantly increased time to incorporate strategies however performance improved with practice. Pt also with greater ease with practice. Pt to practice strategies at home as part of HEP. Pt able to verbalize strategies independently.      Fine Motor Coordination   Other Fine Motor Exercises Addressed in hand manipulation using multiple small items that pt held in hand then manipulated one at time.  Pt initially with moderate diffculty however with practice pt with improvement. Also then provided distraction in order to work toward automaticity with task.              Balance Exercises - 09/07/17 1419      Balance Exercises: Standing   Gait with Head Turns Forward;Intermittent upper extremity support   Tandem Gait Forward;Upper extremity support;4 reps           OT Education - 09/07/17 1540    Education provided Yes   Education Details hand writing strategies (think "BIG LETTERS" and brief pause between  letters)   Person(s) Educated Patient   Methods Explanation;Demonstration;Verbal cues   Comprehension Verbalized understanding;Returned demonstration             OT Long Term Goals - 09/07/17 1540      OT LONG TERM GOAL #1   Title Pt will be mod I with HEP for coordination for RUE - 09/07/2017   Status Achieved     OT LONG TERM GOAL #2   Title Pt will be able to write a sentence level with 100% legibility with AE prn   Status On-going     OT LONG TERM GOAL #3   Title Pt will demonstrate improved coordination as evidenced by decreasing time on 9 hole peg test by at least 20 seconds to assist in using RUE for fine motor tasks. (baseline= 1.10.18)   Status Achieved  08/28/2017 31.87      OT LONG TERM GOAL #4   Title Pt will be mod I for simple snack and beverage prep at ambulatory level   Status Achieved     OT LONG TERM GOAL #5   Title Pt will be mod I for toilet transfers   Status Achieved     OT LONG TERM GOAL #6   Title Pt and wife will verbalize understanding of driving eval recommendations   Status Achieved               Plan - 09/07/17 1541    Clinical Impression Statement Pt progressing toward goals. Pt reports improved ease in using R hand at home.  Pt with improve handwriting however remains labor intensive   Rehab Potential Good   OT Frequency 2x / week   OT Duration 4 weeks   OT Treatment/Interventions Self-care/ADL training;Therapeutic exercise;Neuromuscular education;DME and/or AE instruction;Therapist, nutritional;Therapeutic activities;Patient/family education;Balance training   Plan in hand manipulation, bilateral tasks, writing (pt using brown foam ??work toward coban wrap vs no AE??)   Consulted and Agree with Plan of Care Patient      Patient will benefit from skilled therapeutic intervention in order to improve the following deficits and impairments:  Decreased activity tolerance, Decreased balance, Decreased coordination, Decreased mobility, Decreased knowledge of use of DME, Impaired UE functional use  Visit Diagnosis: Muscle weakness (generalized)  Unsteadiness on feet  Other lack of coordination  Ataxia    Problem List Patient Active Problem List   Diagnosis Date Noted  . Chronic anticoagulation 09/04/2017  . Acute blood loss anemia   . Labile blood glucose   . PAF (paroxysmal atrial fibrillation) ([place])   . Hypoalbuminemia due to protein-calorie malnutrition ([place])   . Diabetes mellitus ([place])   . Benign essential HTN   . Ataxia, post-stroke   . 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 Dizziness 07/25/2017  . Hypothyroidism 09/13/2016  . BMI 26.53,adult 08/28/2015   . Medicare annual wellness visit, subsequent 08/28/2015  . 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 Vitamin D deficiency 04/29/2014  . Medication management 01/03/2014  . 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 BPH (benign prostatic hyperplasia)   . Testosterone deficiency   . 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 Anemia 11/07/2013  . Essential hypertension 11/07/2013  . Hyperlipidemia 11/07/2013  . Paroxysmal atrial fibrillation ([place]) 09/18/2012    Quay Burow, OTR/L 09/07/2017, 3:43 PM  [facility] [address] [place], [place], [address] Phone: [phone]   Fax:  [phone]  Name: Alessio Bogan MRN: [id number] Date of Birth: 1941/10/30

## 2017-09-08 NOTE — Therapy (Signed)
[facility] [address] [place], [place], [address] Phone: [phone]   Fax:  [phone]  Physical Therapy Treatment  Patient Details  Name: Earl [name] MRN: [id number] Date of Birth: December 30, 1940 Referring Provider: Dellie Catholic)  Encounter Date: 09/07/2017      PT End of Session - 09/08/17 2155    Visit Number 6   Number of Visits 18   Date for PT Re-Evaluation 10/09/17   Authorization Type UHC Medicare-GCODE and progress note due every 10th visit   PT Start Time 1404   PT Stop Time 1445   PT Time Calculation (min) 41 min   Equipment Utilized During Treatment Gait belt   Activity Tolerance Patient tolerated treatment well   Behavior During Therapy [facility] for tasks assessed/performed      Past Medical History:  Diagnosis Date  . BPH (benign prostatic hyperplasia)   . Diabetes mellitus ([place])   . Hyperlipidemia   . Hypertension   . Other testicular hypofunction   . Type II or unspecified type diabetes mellitus without mention of complication, not stated as uncontrolled     Past Surgical History:  Procedure Laterality Date  . [place] and 2004  . LOOP RECORDER INSERTION N/A 07/28/2017   Procedure: LOOP RECORDER INSERTION;  Surgeon: Deboraha Sprang, MD;  Location: [facility];  Service: Cardiovascular;  Laterality: N/A;  . TEE WITHOUT CARDIOVERSION N/A 07/28/2017   Procedure: TRANSESOPHAGEAL ECHOCARDIOGRAM (TEE);  Surgeon: Sueanne Margarita, MD;  Location: [facility];  Service: Cardiovascular;  Laterality: N/A;    There were no vitals filed for this visit.      Subjective Assessment - 09/07/17 1406    Subjective No changes, no pain today.   Patient is accompained by: Family member  daughter and spouse   Pertinent History DM, PAF, HTN   Patient Stated Goals Work on balance, walking and get back to independent                         [facility]  Adult PT Treatment/Exercise - 09/08/17 0001      Ambulation/Gait   Ambulation/Gait Yes   Ambulation/Gait Assistance 4: Min guard;4: Min assist   Ambulation Distance (Feet) 230 Feet  115, 10 ft x 2   Assistive device Straight cane  with rubber quad tip   Gait Pattern Step-through pattern;Decreased step length - right;Decreased step length - left;Wide base of support;Ataxic;Decreased weight shift to right   Ambulation Surface Level   Gait Comments Gait training with SPC this visit; pt able to self-correct at beginning of gait to step-through pattern.  Gait with conversation and gait with head turns/environmental scanning.  Pt has widened BOS and slowed, more unsteady gait with head turns.     Self-Care   Self-Care Other Self-Care Comments   Other Self-Care Comments  Discussed CVA warning signs and symptoms-pt verbalizes understanding.  Discussed POC, including adding more appoinments to work towards [place].  Pt in agreement.  Discussed improvements with gait with cane in therapy, but unsteadiness remains with dynamic gait with cane and pt agrees further work towards [place] and independence with gait.     Knee/Hip Exercises: Stretches   Active Hamstring Stretch Right;Left;2 reps;30 seconds   Active Hamstring Stretch Limitations Reviewed, with pt return demo understanding   Other Knee/Hip Stretches standing gastroc stretch, foot propped on 2" block, UE support at counter, 2 reps 30 seconds each  leg.  Reviewed with pt return demo understanding.             Balance Exercises - 09/07/17 1419      Balance Exercises: Standing   SLS Eyes open;Upper extremity support 1;3 reps;10 secs   Gait with Head Turns Forward;Intermittent upper extremity support   Tandem Gait Forward;Upper extremity support;4 reps   Partial Tandem Stance Eyes open;Intermittent upper extremity support;3 reps;10 secs   Marching Limitations Marching forward, along counter, 4 reps, then tandem marching along counter, x 4 reps    Heel Raises Limitations x 20 reps   Toe Raise Limitations x 20 reps   Other Standing Exercises Reviewed sit<>stand from mat and chair height, x 5 reps each surface.           PT Education - 09/08/17 2154    Education provided Yes   Education Details POC, adding more appts, formal STG check next week; improvements with gait with cane but unsteadiness limiting safe, independent gait at this time; CVA education   Person(s) Educated Patient   Methods Explanation;Demonstration   Comprehension Verbalized understanding          PT Short Term Goals - 09/07/17 1426      PT SHORT TERM GOAL #1   Title Pt will be independent with HEP for improved balance, strength, and gait.  TARGET 09/08/17   Time 5   Period Weeks   Status New     PT SHORT TERM GOAL #2   Title Pt will perform at least 6 of 10 reps of sit<>stand with minimal to no UE support for improved transfer efficiency and safety   Time 5   Period Weeks   Status New     PT SHORT TERM GOAL #3   Title Pt will improve Berg Balance score to at least 34/56 for decreased fall risk.   Time 5   Period Weeks   Status New     PT SHORT TERM GOAL #4   Title Pt will improve TUG score to less than or equal to 28 seconds for decreased fall risk.   Time 5   Period Weeks   Status New     PT SHORT TERM GOAL #5   Title Pt will verbalize understanding of CVA warning signs and symptoms.   Time 5   Period Weeks   Status New           PT Long Term Goals - 08/11/17 1618      PT LONG TERM GOAL #1   Title Pt/wife will verbalize understanding of fall prevention in the home environment.  TARGET 10/06/17   Time 9   Period Weeks   Status New   Target Date 10/06/17     PT LONG TERM GOAL #2   Title Pt will improve gait velocity to at least 1.8 ft/sec for decreased fall risk/improved efficiency and safety with gait.   Time 9   Period Weeks   Status New   Target Date 10/06/17     PT LONG TERM GOAL #3   Title Pt will improve Berg  Balance score to at least 42/56 for decreased fall risk.   Time 9   Period Weeks   Status New   Target Date 10/06/17     PT LONG TERM GOAL #4   Title Pt will improve TUG score to less than or equal to 20 seconds for decreased fall risk.   Time 9   Period Weeks   Status New  Target Date 10/06/17     PT LONG TERM GOAL #5   Title Pt will ambulate at least 50-100 ft using cane, with supervision for improved independence with gait with lesser assistive device.   Time 9   Period Weeks   Status New   Target Date 10/06/17               Plan - 09/08/17 2155    Clinical Impression Statement With gait training this session with cane, pt able to immediately self-correct to step through pattern.  Pt improving with gait pattern with cane, but with head turns and environmental scanning, pt has increased unsteadiness, slowed pace and widened BOS.  Pt will continue to benefit from skilled PT to address balance, strength, and gait.   Rehab Potential Good   PT Frequency --  1x/wk for 1 week, then 2x/wk for 8 weeks   PT Duration Other (comment)  POC = 9 weeks   PT Treatment/Interventions ADLs/Self Care Home Management;Functional mobility training;Gait training;Therapeutic activities;DME Instruction;Therapeutic exercise;Balance training;Neuromuscular re-education;Patient/family education;Orthotic Fit/Training   PT Next Visit Plan Continue gait training with cane, continue balance training/hip stability work; check STGs   Consulted and Agree with Plan of Care Patient      Patient will benefit from skilled therapeutic intervention in order to improve the following deficits and impairments:  Abnormal gait, Decreased balance, Decreased mobility, Decreased strength, Difficulty walking  Visit Diagnosis: Other abnormalities of gait and mobility  Unsteadiness on feet  Muscle weakness (generalized)     Problem List Patient Active Problem List   Diagnosis Date Noted  . Chronic  anticoagulation 09/04/2017  . Acute blood loss anemia   . Labile blood glucose   . PAF (paroxysmal atrial fibrillation) ([place])   . Hypoalbuminemia due to protein-calorie malnutrition ([place])   . Diabetes mellitus ([place])   . Benign essential HTN   . Ataxia, post-stroke   . 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 Dizziness 07/25/2017  . Hypothyroidism 09/13/2016  . BMI 26.53,adult 08/28/2015  . Medicare annual wellness visit, subsequent 08/28/2015  . 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 Vitamin D deficiency 04/29/2014  . Medication management 01/03/2014  . 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 BPH (benign prostatic hyperplasia)   . Testosterone deficiency   . 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 Anemia 11/07/2013  . Essential hypertension 11/07/2013  . Hyperlipidemia 11/07/2013  . Paroxysmal atrial fibrillation ([place]) 09/18/2012    MARRIOTT,AMY W. 09/08/2017, 9:59 PM  Frazier Butt., PT   [facility] [address] [place], [place], [address] Phone: [phone]   Fax:  [phone]  Name: Earl [name] MRN: [id number] Date of Birth: [birth year]

## 2017-09-11 ENCOUNTER — Ambulatory Visit: Payer: Medicare Other | Admitting: Occupational Therapy

## 2017-09-11 ENCOUNTER — Encounter: Payer: Self-pay | Admitting: Occupational Therapy

## 2017-09-11 ENCOUNTER — Ambulatory Visit: Payer: Medicare Other | Admitting: Physical Therapy

## 2017-09-11 DIAGNOSIS — R2681 Unsteadiness on feet: Secondary | ICD-10-CM | POA: Diagnosis not present

## 2017-09-11 DIAGNOSIS — M6281 Muscle weakness (generalized): Secondary | ICD-10-CM | POA: Diagnosis not present

## 2017-09-11 DIAGNOSIS — R2689 Other abnormalities of gait and mobility: Secondary | ICD-10-CM | POA: Diagnosis not present

## 2017-09-11 DIAGNOSIS — R270 Ataxia, unspecified: Secondary | ICD-10-CM | POA: Diagnosis not present

## 2017-09-11 DIAGNOSIS — R278 Other lack of coordination: Secondary | ICD-10-CM | POA: Diagnosis not present

## 2017-09-11 NOTE — Therapy (Signed)
[facility] [address] [place], [place], [address] Phone: [phone]   Fax:  [phone]  Occupational Therapy Treatment  Patient Details  Name: Earl [name] MRN: [id number] Date of Birth: 05-23-1941 Referring Provider: Dr. Posey Pronto  Encounter Date: 09/11/2017      OT End of Session - 09/11/17 1432    Visit Number 7   Number of Visits 8   Date for OT Re-Evaluation 09/07/17   Authorization Type UHC Medicare - will need G code and PN every 10th visit   Authorization Time Period 60 days   Authorization - Visit Number 7   Authorization - Number of Visits 10   OT Start Time 1401   OT Stop Time 1443   OT Time Calculation (min) 42 min   Activity Tolerance Patient tolerated treatment well      Past Medical History:  Diagnosis Date  . BPH (benign prostatic hyperplasia)   . Diabetes mellitus ([place])   . Hyperlipidemia   . Hypertension   . Other testicular hypofunction   . Type II or unspecified type diabetes mellitus without mention of complication, not stated as uncontrolled     Past Surgical History:  Procedure Laterality Date  . [place] and 2004  . LOOP RECORDER INSERTION N/A 07/28/2017   Procedure: LOOP RECORDER INSERTION;  Surgeon: Deboraha Sprang, MD;  Location: [facility];  Service: Cardiovascular;  Laterality: N/A;  . TEE WITHOUT CARDIOVERSION N/A 07/28/2017   Procedure: TRANSESOPHAGEAL ECHOCARDIOGRAM (TEE);  Surgeon: Sueanne Margarita, MD;  Location: [facility] END[facility];  Service: Cardiovascular;  Laterality: N/A;    There were no vitals filed for this visit.      Subjective Assessment - 09/11/17 1403    Subjective  I am feeling good today   Pertinent History Pt with R cerebellar stroke.  Check BP intermittent    Patient Stated Goals I want to get back to normal - use my R hand and walk better.   Currently in Pain? No/denies                      OT  Treatments/Exercises (OP) - 09/11/17 0001      ADLs   Writing Addresed writing. Pt using pen gripper at home and reports he is practicing daily. Pt reports strategies of "THINK BIG" and brief pause between letters is very helpful.  Pt able to    ADL Comments Reviewed progress to date - pt has met all goals and is ready for discharge from OT. See goals for updates     Fine Motor Coordination   Other Fine Motor Exercises Addressed in hand manipulation using mutliple small items. Pt initially with moderate difficulty however improved with practice. Pt with no difficuly manipulating two small items in hand.              Balance Exercises - 09/11/17 1409      Balance Exercises: Standing   Standing Eyes Opened Wide (BOA);Narrow base of support (BOS);Head turns;5 reps  Head nods   Standing Eyes Closed Wide (BOA);Narrow base of support (BOS);Head turns;Solid surface;5 reps  HEad nods; cues for visual target/abdominal activation   SLS Eyes open;Solid surface;Intermittent upper extremity support;2 reps;10 secs                OT Long Term Goals - 09/11/17 1429      OT LONG TERM GOAL #1   Title Pt will be  mod I with HEP for coordination for RUE - 09/07/2017   Status Achieved     OT LONG TERM GOAL #2   Title Pt will be able to write a sentence level with 100% legibility with AE prn   Status Achieved  4 sentence paragraph     OT LONG TERM GOAL #3   Title Pt will demonstrate improved coordination as evidenced by decreasing time on 9 hole peg test by at least 20 seconds to assist in using RUE for fine motor tasks. (baseline= 1.10.18)   Status Achieved  08/28/2017 31.87     OT LONG TERM GOAL #4   Title Pt will be mod I for simple snack and beverage prep at ambulatory level   Status Achieved     OT LONG TERM GOAL #5   Title Pt will be mod I for toilet transfers   Status Achieved     OT LONG TERM GOAL #6   Title Pt and wife will verbalize understanding of driving eval  recommendations   Status Achieved               Plan - 09/11/17 1429    Clinical Impression Statement Pt has met all goals and is ready for discharge.  Pt in agreement.    Rehab Potential Good   OT Frequency 2x / week   OT Duration 4 weeks   OT Treatment/Interventions Self-care/ADL training;Therapeutic exercise;Neuromuscular education;DME and/or AE instruction;Therapist, nutritional;Therapeutic activities;Patient/family education;Balance training   Plan d/c from OT   Consulted and Agree with Plan of Care Patient      Patient will benefit from skilled therapeutic intervention in order to improve the following deficits and impairments:  Decreased activity tolerance, Decreased balance, Decreased coordination, Decreased mobility, Decreased knowledge of use of DME, Impaired UE functional use  Visit Diagnosis: Unsteadiness on feet  Muscle weakness (generalized)  Other lack of coordination  Ataxia    Problem List Patient Active Problem List   Diagnosis Date Noted  . Chronic anticoagulation 09/04/2017  . Acute blood loss anemia   . Labile blood glucose   . PAF (paroxysmal atrial fibrillation) ([place])   . Hypoalbuminemia due to protein-calorie malnutrition ([place])   . Diabetes mellitus ([place])   . Benign essential HTN   . Ataxia, post-stroke   . 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 Dizziness 07/25/2017  . Hypothyroidism 09/13/2016  . BMI 26.53,adult 08/28/2015  . Medicare annual wellness visit, subsequent 08/28/2015  . 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 Vitamin D deficiency 04/29/2014  . Medication management 01/03/2014  . 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 BPH (benign prostatic hyperplasia)   . Testosterone deficiency   . 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 Anemia 11/07/2013  . Essential hypertension 11/07/2013  . Hyperlipidemia 11/07/2013  . Paroxysmal  atrial fibrillation ([place]) 09/18/2012   OCCUPATIONAL THERAPY DISCHARGE SUMMARY  Visits from Start of Care: 7  Current functional level related to goals / functional outcomes: See above   Remaining deficits: Mild RUE incoordination   Education / Equipment: HEP Plan: Patient agrees to discharge.  Patient goals were met. Patient is being discharged due to meeting the stated rehab goals.  ?????      Quay Burow, OTR/L 09/11/2017, 2:43 PM  [facility] [address] [place], [place], [address] Phone: [phone]   Fax:  [phone]  Name: Earl [name] MRN: [id number] Date  of Birth: 1941-04-11

## 2017-09-11 NOTE — Therapy (Signed)
[facility] [address] [place], [place], [address] Phone: [phone]   Fax:  [phone]  Physical Therapy Treatment  Patient Details  Name: Earl [name] MRN: [id number] Date of Birth: 12-Jul-1941 Referring Provider: Dellie Catholic)  Encounter Date: 09/11/2017      PT End of Session - 09/11/17 1411    Visit Number 7   Number of Visits 18   Date for PT Re-Evaluation 10/09/17   Authorization Type [employer] Medicare-GCODE and progress note due every 10th visit   PT Start Time 1320   PT Stop Time 1401   PT Time Calculation (min) 41 min   Equipment Utilized During Treatment Gait belt   Activity Tolerance Patient tolerated treatment well   Behavior During Therapy [facility] for tasks assessed/performed      Past Medical History:  Diagnosis Date  . BPH (benign prostatic hyperplasia)   . Diabetes mellitus ([place])   . Hyperlipidemia   . Hypertension   . Other testicular hypofunction   . Type II or unspecified type diabetes mellitus without mention of complication, not stated as uncontrolled     Past Surgical History:  Procedure Laterality Date  . [place] and 2004  . LOOP RECORDER INSERTION N/A 07/28/2017   Procedure: LOOP RECORDER INSERTION;  Surgeon: Deboraha Sprang, MD;  Location: [facility];  Service: Cardiovascular;  Laterality: N/A;  . TEE WITHOUT CARDIOVERSION N/A 07/28/2017   Procedure: TRANSESOPHAGEAL ECHOCARDIOGRAM (TEE);  Surgeon: Sueanne Margarita, MD;  Location: [facility] ENDOSCOPY;  Service: Cardiovascular;  Laterality: N/A;    There were no vitals filed for this visit.      Subjective Assessment - 09/11/17 1322    Subjective Nothing new, other than a flat tire today.   Patient is accompained by: Family member  daughter and spouse   Pertinent History DM, PAF, HTN   Patient Stated Goals Work on balance, walking and get back to independent   Currently in Pain? No/denies                         [facility] Adult PT Treatment/Exercise - 09/11/17 1408      Transfers   Transfers Sit to Stand;Stand to Sit   Sit to Stand 7: Independent   Stand to Sit 7: Independent   Number of Reps 10 reps  from 20" mat surface, then 18" chair     Standardized Balance Assessment   Standardized Balance Assessment Berg Balance Test;Timed Up and Go Test;Dynamic Gait Index     Berg Balance Test   Sit to Stand Able to stand without using hands and stabilize independently   Standing Unsupported Able to stand safely 2 minutes   Sitting with Back Unsupported but Feet Supported on Floor or Stool Able to sit safely and securely 2 minutes   Stand to Sit Sits safely with minimal use of hands   Transfers Able to transfer safely, definite need of hands   Standing Unsupported with Eyes Closed Able to stand 10 seconds safely   Standing Ubsupported with Feet Together Able to place feet together independently and stand for 1 minute with supervision   From Standing, Reach Forward with Outstretched Arm Reaches forward but needs supervision  8", but with supervision   From Standing Position, Pick up Object from [place] to pick up shoe, needs supervision   From Standing Position, Turn to Look Behind Over each Shoulder Looks behind  from both sides and weight shifts well   Turn 360 Degrees Able to turn 360 degrees safely but slowly   Standing Unsupported, Alternately Place Feet on Step/Stool Able to stand independently and safely and complete 8 steps in 20 seconds   Standing Unsupported, One Foot in Front Able to plae foot ahead of the other independently and hold 30 seconds   Standing on One Leg Tries to lift leg/unable to hold 3 seconds but remains standing independently   Total Score 44     Dynamic Gait Index   Level Surface Moderate Impairment   Change in Gait Speed Mild Impairment   Gait with Horizontal Head Turns Moderate Impairment   Gait with Vertical Head Turns Moderate  Impairment   Gait and Pivot Turn Mild Impairment  4.22 sec   Step Over Obstacle Mild Impairment   Step Around Obstacles Mild Impairment   Steps Mild Impairment   Total Score 13     Timed Up and Go Test   TUG Normal TUG   Normal TUG (seconds) 22.54  cane; 20.66 sec with RW             Balance Exercises - 09/11/17 1409      Balance Exercises: Standing   Standing Eyes Opened Wide (BOA);Narrow base of support (BOS);Head turns;5 reps  Head nods   Standing Eyes Closed Wide (BOA);Narrow base of support (BOS);Head turns;Solid surface;5 reps  HEad nods; cues for visual target/abdominal activation   SLS Eyes open;Solid surface;Intermittent upper extremity support;2 reps;10 secs           PT Education - 09/11/17 1419    Education provided Yes   Education Details Results of objective measures/progress towards goals (upgrading LTGs due to progress with PT); discussed patient remains at fall risk per TUG, Berg and DGI measures and still needs to use RW until advised by PT to transition to cane   Person(s) Educated Patient   Methods Explanation   Comprehension Verbalized understanding          PT Short Term Goals - 09/11/17 1322      PT SHORT TERM GOAL #1   Title Pt will be independent with HEP for improved balance, strength, and gait.  TARGET 09/08/17   Time 5   Period Weeks   Status Achieved     PT SHORT TERM GOAL #2   Title Pt will perform at least 6 of 10 reps of sit<>stand with minimal to no UE support for improved transfer efficiency and safety   Time 5   Period Weeks   Status Achieved     PT SHORT TERM GOAL #3   Title Pt will improve Berg Balance score to at least 34/56 for decreased fall risk.   Baseline 44/56 on 09/11/17   Time 5   Period Weeks   Status Achieved     PT SHORT TERM GOAL #4   Title Pt will improve TUG score to less than or equal to 28 seconds for decreased fall risk.   Baseline 22.54 sec with cane, 20.66 sec with RW 09/11/17   Time 5    Period Weeks   Status Achieved     PT SHORT TERM GOAL #5   Title Pt will verbalize understanding of CVA warning signs and symptoms.   Time 5   Period Weeks   Status Achieved           PT Long Term Goals - 09/11/17 1412      PT LONG TERM GOAL #  1   Title Pt/wife will verbalize understanding of fall prevention in the home environment.  TARGET 10/06/17   Time 9   Period Weeks   Status On-going   Target Date 10/06/17     PT LONG TERM GOAL #2   Title Pt will improve gait velocity to at least 1.8 ft/sec for decreased fall risk/improved efficiency and safety with gait.   Time 9   Period Weeks   Status On-going   Target Date 10/06/17     PT LONG TERM GOAL #3   Title Pt will improve Berg Balance score to at least 42/56 for decreased fall risk.  Upgrading goal to [place] >49/56 for decreased fall risk   Time 9   Period Weeks   Status Revised   Target Date 10/06/17     PT LONG TERM GOAL #4   Title Pt will improve TUG score to less than or equal to 20 seconds for decreased fall risk.  Upgrading goal to TUG < or equal to 15 seconds for decreased fall risk.   Time 9   Period Weeks   Status Revised   Target Date 10/06/17     PT LONG TERM GOAL #5   Title Pt will ambulate at least 50-100 ft using cane, with supervision for improved independence with gait with lesser assistive device.  Upgrading goal to at least 500 ft, indoor and outdoor with cane, supervision.   Time 9   Period Weeks   Status Revised   Target Date 10/06/17               Plan - 09/11/17 1416    Clinical Impression Statement Assessed STGs this visit, with pt meeting all STGs.  Revised LTG 3-5 for Berg, TUG and gait distance with lesser assistive device due to improvement/gains made with functional mobility.  DGI assessed today, with pt at fall risk with score of 13/24. With improvements with Berg and TUG, pt remains at fall risk and would continue to benefit from skilled PT to address balance, strength and  gait.   Rehab Potential Good   PT Frequency --  1x/wk for 1 week, then 2x/wk for 8 weeks   PT Duration Other (comment)  POC = 9 weeks   PT Treatment/Interventions ADLs/Self Care Home Management;Functional mobility training;Gait training;Therapeutic activities;DME Instruction;Therapeutic exercise;Balance training;Neuromuscular re-education;Patient/family education;Orthotic Fit/Training   PT Next Visit Plan Corner balance exercises, tandem and SLS, compliant surfaces; hip and core stability, continue gait training with SPC; add to HEP as appropriate   Consulted and Agree with Plan of Care Patient      Patient will benefit from skilled therapeutic intervention in order to improve the following deficits and impairments:  Abnormal gait, Decreased balance, Decreased mobility, Decreased strength, Difficulty walking  Visit Diagnosis: Unsteadiness on feet  Other abnormalities of gait and mobility     Problem List Patient Active Problem List   Diagnosis Date Noted  . Chronic anticoagulation 09/04/2017  . Acute blood loss anemia   . Labile blood glucose   . PAF (paroxysmal atrial fibrillation) ([place])   . Hypoalbuminemia due to protein-calorie malnutrition ([place])   . Diabetes mellitus ([place])   . Benign essential HTN   . Ataxia, post-stroke   . 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 Dizziness 07/25/2017  . Hypothyroidism 09/13/2016  . BMI 26.53,adult 08/28/2015  . Medicare annual wellness visit, subsequent 08/28/2015  . 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 Vitamin D deficiency  04/29/2014  . Medication management 01/03/2014  . 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 BPH (benign prostatic hyperplasia)   . Testosterone deficiency   . 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 Anemia 11/07/2013  . Essential hypertension 11/07/2013  . Hyperlipidemia 11/07/2013  . Paroxysmal atrial fibrillation ([place])  09/18/2012    Flor Whitacre W. 09/11/2017, 2:22 PM  Frazier Butt., PT   [facility] [address] [place], [place], [address] Phone: [phone]   Fax:  [phone]  Name: Jeremaih Klima MRN: [id number] Date of Birth: 17-Jun-1941

## 2017-09-14 ENCOUNTER — Ambulatory Visit: Payer: Medicare Other | Admitting: Physical Therapy

## 2017-09-14 ENCOUNTER — Encounter: Payer: Medicare Other | Admitting: Occupational Therapy

## 2017-09-14 ENCOUNTER — Encounter: Payer: Self-pay | Admitting: Physical Therapy

## 2017-09-14 DIAGNOSIS — R2681 Unsteadiness on feet: Secondary | ICD-10-CM

## 2017-09-14 DIAGNOSIS — R270 Ataxia, unspecified: Secondary | ICD-10-CM | POA: Diagnosis not present

## 2017-09-14 DIAGNOSIS — M6281 Muscle weakness (generalized): Secondary | ICD-10-CM

## 2017-09-14 DIAGNOSIS — R2689 Other abnormalities of gait and mobility: Secondary | ICD-10-CM | POA: Diagnosis not present

## 2017-09-14 DIAGNOSIS — R278 Other lack of coordination: Secondary | ICD-10-CM | POA: Diagnosis not present

## 2017-09-14 NOTE — Therapy (Signed)
[facility] [address] [place], [place], [address] Phone: [phone]   Fax:  [phone]  Physical Therapy Treatment  Patient Details  Name: Earl [name] MRN: [id number] Date of Birth: 05-19-1941 Referring Provider: Dellie Catholic)  Encounter Date: 09/14/2017      PT End of Session - 09/14/17 2132    Visit Number 8   Number of Visits 18   Date for PT Re-Evaluation 10/09/17   Authorization Type UHC Medicare-GCODE and progress note due every 10th visit   PT Start Time 1446   PT Stop Time 1530   PT Time Calculation (min) 44 min   Equipment Utilized During Treatment Gait belt   Activity Tolerance Patient tolerated treatment well   Behavior During Therapy [facility] for tasks assessed/performed      Past Medical History:  Diagnosis Date  . BPH (benign prostatic hyperplasia)   . Diabetes mellitus ([place])   . Hyperlipidemia   . Hypertension   . Other testicular hypofunction   . Type II or unspecified type diabetes mellitus without mention of complication, not stated as uncontrolled     Past Surgical History:  Procedure Laterality Date  . [place] and 2004  . LOOP RECORDER INSERTION N/A 07/28/2017   Procedure: LOOP RECORDER INSERTION;  Surgeon: Deboraha Sprang, MD;  Location: [facility];  Service: Cardiovascular;  Laterality: N/A;  . TEE WITHOUT CARDIOVERSION N/A 07/28/2017   Procedure: TRANSESOPHAGEAL ECHOCARDIOGRAM (TEE);  Surgeon: Sueanne Margarita, MD;  Location: [facility] ENDOSCOPY;  Service: Cardiovascular;  Laterality: N/A;    There were no vitals filed for this visit.      Subjective Assessment - 09/14/17 1449    Subjective Feels exercises are helping. Walking with feet in line is still difficult.    Patient is accompained by:    Pertinent History DM, PAF, HTN   Patient Stated Goals Work on balance, walking and get back to independent   Currently in Pain? No/denies         Treatment- See pt instructions for HEP update. Pt performed standard bridging x 10 with 5 second hold and then progressed to bridge with marching x 10.   Gait training with SPC for sequencing, cane placement, LLE step length, RUE relaxation and swing. UP to min assist for balance on sloping outdoor sidewalk.                          PT Education - 09/14/17 2131    Education provided Yes   Education Details update to HEP; safe use of cane with quad tip and how to purchase cane and special tip   Person(s) Educated Patient   Methods Explanation;Demonstration;Handout   Comprehension Verbalized understanding;Returned demonstration;Verbal cues required          PT Short Term Goals - 09/11/17 1322      PT SHORT TERM GOAL #1   Title Pt will be independent with HEP for improved balance, strength, and gait.  TARGET 09/08/17   Time 5   Period Weeks   Status Achieved     PT SHORT TERM GOAL #2   Title Pt will perform at least 6 of 10 reps of sit<>stand with minimal to no UE support for improved transfer efficiency and safety   Time 5   Period Weeks   Status Achieved     PT SHORT TERM GOAL #3   Title Pt will  improve Berg Balance score to at least 34/56 for decreased fall risk.   Baseline 44/56 on 09/11/17   Time 5   Period Weeks   Status Achieved     PT SHORT TERM GOAL #4   Title Pt will improve TUG score to less than or equal to 28 seconds for decreased fall risk.   Baseline 22.54 sec with cane, 20.66 sec with RW 09/11/17   Time 5   Period Weeks   Status Achieved     PT SHORT TERM GOAL #5   Title Pt will verbalize understanding of CVA warning signs and symptoms.   Time 5   Period Weeks   Status Achieved           PT Long Term Goals - 09/11/17 1412      PT LONG TERM GOAL #1   Title Pt/wife will verbalize understanding of fall prevention in the home environment.  TARGET 10/06/17   Time 9   Period Weeks   Status On-going   Target Date  10/06/17     PT LONG TERM GOAL #2   Title Pt will improve gait velocity to at least 1.8 ft/sec for decreased fall risk/improved efficiency and safety with gait.   Time 9   Period Weeks   Status On-going   Target Date 10/06/17     PT LONG TERM GOAL #3   Title Pt will improve Berg Balance score to at least 42/56 for decreased fall risk.  Upgrading goal to [place] >49/56 for decreased fall risk   Time 9   Period Weeks   Status Revised   Target Date 10/06/17     PT LONG TERM GOAL #4   Title Pt will improve TUG score to less than or equal to 20 seconds for decreased fall risk.  Upgrading goal to TUG < or equal to 15 seconds for decreased fall risk.   Time 9   Period Weeks   Status Revised   Target Date 10/06/17     PT LONG TERM GOAL #5   Title Pt will ambulate at least 50-100 ft using cane, with supervision for improved independence with gait with lesser assistive device.  Upgrading goal to at least 500 ft, indoor and outdoor with cane, supervision.   Time 9   Period Weeks   Status Revised   Target Date 10/06/17               Plan - 09/14/17 2133    Clinical Impression Statement Session focused on LE strengthening, balance and gait training with LRAD. Patient given information on obtaining a SPC and encouraged to begin using inside home (familiar environment). Patient making excellent progress towards goals.    Rehab Potential Good   PT Frequency --  1x/wk for 1 week, then 2x/wk for 8 weeks   PT Duration Other (comment)  POC = 9 weeks   PT Treatment/Interventions ADLs/Self Care Home Management;Functional mobility training;Gait training;Therapeutic activities;DME Instruction;Therapeutic exercise;Balance training;Neuromuscular re-education;Patient/family education;Orthotic Fit/Training   PT Next Visit Plan check Corner balance exercise;, compliant surfaces; hip and core stability, continue gait training with SPC; add to HEP as appropriate   Consulted and Agree with Plan of Care  Patient      Patient will benefit from skilled therapeutic intervention in order to improve the following deficits and impairments:  Abnormal gait, Decreased balance, Decreased mobility, Decreased strength, Difficulty walking  Visit Diagnosis: Muscle weakness (generalized)  Unsteadiness on feet     Problem List Patient Active Problem List  Diagnosis Date Noted  . Chronic anticoagulation 09/04/2017  . Acute blood loss anemia   . Labile blood glucose   . PAF (paroxysmal atrial fibrillation) ([place])   . Hypoalbuminemia due to protein-calorie malnutrition ([place])   . Diabetes mellitus ([place])   . Benign essential HTN   . Ataxia, post-stroke   . 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 Dizziness 07/25/2017  . Hypothyroidism 09/13/2016  . BMI 26.53,adult 08/28/2015  . Medicare annual wellness visit, subsequent 08/28/2015  . 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 Vitamin D deficiency 04/29/2014  . Medication management 01/03/2014  . 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 BPH (benign prostatic hyperplasia)   . Testosterone deficiency   . 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 Anemia 11/07/2013  . Essential hypertension 11/07/2013  . Hyperlipidemia 11/07/2013  . Paroxysmal atrial fibrillation ([place]) 09/18/2012    Rexanne Mano, PT 09/14/2017, 9:36 PM  [facility] [address] [place], [place], [address] Phone: [phone]   Fax:  [phone]  Name: Earl [name] MRN: [id number] Date of Birth: 1941-08-21

## 2017-09-14 NOTE — Patient Instructions (Signed)
Bracing With March in Bridging (Hook-Lying)    Lie on back with both legs bent and feet apart. Cross arms across your chest. Tighten abdominals and lift bottom and hold, then march in place. March __4_ times. Do __10_ times. Do once per day.   Copyright  VHI. All rights reserved.

## 2017-09-18 ENCOUNTER — Encounter: Payer: Self-pay | Admitting: Internal Medicine

## 2017-09-18 ENCOUNTER — Ambulatory Visit (INDEPENDENT_AMBULATORY_CARE_PROVIDER_SITE_OTHER): Payer: Medicare Other | Admitting: Internal Medicine

## 2017-09-18 VITALS — BP 126/74 | HR 72 | Temp 97.3°F | Resp 16 | Ht 71.0 in | Wt 178.4 lb

## 2017-09-18 DIAGNOSIS — I10 Essential (primary) hypertension: Secondary | ICD-10-CM | POA: Diagnosis not present

## 2017-09-18 DIAGNOSIS — N182 Chronic kidney disease, stage 2 (mild): Secondary | ICD-10-CM | POA: Diagnosis not present

## 2017-09-18 DIAGNOSIS — Z79899 Other long term (current) drug therapy: Secondary | ICD-10-CM

## 2017-09-18 DIAGNOSIS — E782 Mixed hyperlipidemia: Secondary | ICD-10-CM | POA: Diagnosis not present

## 2017-09-18 DIAGNOSIS — I63441 Cerebral infarction due to embolism of right cerebellar artery: Secondary | ICD-10-CM | POA: Diagnosis not present

## 2017-09-18 DIAGNOSIS — E039 Hypothyroidism, unspecified: Secondary | ICD-10-CM

## 2017-09-18 DIAGNOSIS — E1122 Type 2 diabetes mellitus with diabetic chronic kidney disease: Secondary | ICD-10-CM

## 2017-09-18 DIAGNOSIS — E559 Vitamin D deficiency, unspecified: Secondary | ICD-10-CM

## 2017-09-18 NOTE — Patient Instructions (Signed)
Recommend Adult Low Dose Aspirin or  coated  Aspirin 81 mg daily  To reduce risk of Colon Cancer 20 %,  Skin Cancer 26 % ,  Melanoma 46%  and  Pancreatic cancer 60% +++++++++++++++++++++++++ Vitamin D goal  is between 70-100.  Please make sure that you are taking your Vitamin D as directed.  It is very important as a natural anti-inflammatory  helping hair, skin, and nails, as well as reducing stroke and heart attack risk.  It helps your bones and helps with mood. It also decreases numerous cancer risks so please take it as directed.  Low Vit D is associated with a 200-300% higher risk for CANCER  and 200-300% higher risk for HEART   ATTACK  &  STROKE.   ...................................... It is also associated with higher death rate at younger ages,  autoimmune diseases like Rheumatoid arthritis, Lupus, Multiple Sclerosis.    Also many other serious conditions, like depression, Alzheimer's Dementia, infertility, muscle aches, fatigue, fibromyalgia - just to name a few. ++++++++++++++++++++ Recommend the book "The END of DIETING" by Dr Joel Fuhrman  & the book "The END of DIABETES " by Dr Joel Fuhrman At [employer] - get book & Audio CD's    Being diabetic has a  300% increased risk for heart attack, stroke, cancer, and alzheimer- type vascular dementia. It is very important that you work harder with diet by avoiding all foods that are white. Avoid white rice (brown & wild rice is OK), white potatoes (sweetpotatoes in moderation is OK), White bread or wheat bread or anything made out of white flour like bagels, donuts, rolls, buns, biscuits, cakes, pastries, cookies, pizza crust, and pasta (made from white flour & egg whites) - vegetarian pasta or spinach or wheat pasta is OK. Multigrain breads like Arnold's or Pepper[employer], or multigrain sandwich thins or flatbreads.  Diet, exercise and weight loss can reverse and cure diabetes in the early stages.  Diet, exercise and weight loss is  very important in the control and prevention of complications of diabetes which affects every system in your body, ie. Brain - dementia/stroke, eyes - glaucoma/blindness, heart - heart attack/heart failure, kidneys - dialysis, stomach - gastric paralysis, intestines - malabsorption, nerves - severe painful neuritis, circulation - gangrene & loss of a leg(s), and finally cancer and Alzheimers.    I recommend avoid fried & greasy foods,  sweets/candy, white rice (brown or wild rice or Quinoa is OK), white potatoes (sweet potatoes are OK) - anything made from white flour - bagels, doughnuts, rolls, buns, biscuits,white and wheat breads, pizza crust and traditional pasta made of white flour & egg white(vegetarian pasta or spinach or wheat pasta is OK).  Multi-grain bread is OK - like multi-grain flat bread or sandwich thins. Avoid alcohol in excess. Exercise is also important.    Eat all the vegetables you want - avoid meat, especially red meat and dairy - especially cheese.  Cheese is the most concentrated form of trans-fats which is the worst thing to clog up our arteries. Veggie cheese is OK which can be found in the fresh produce section at [employer] or [employer] or [employer]  +++++++++++++++++++++ DASH Eating Plan  DASH stands for "Dietary Approaches to Stop Hypertension."   The DASH eating plan is a healthy eating plan that has been shown to reduce high blood pressure (hypertension). Additional health benefits may include reducing the risk of type 2 diabetes mellitus, heart disease, and stroke. The DASH eating plan may also   help with weight loss. WHAT DO I NEED TO KNOW ABOUT THE DASH EATING PLAN? For the DASH eating plan, you will follow these general guidelines:  Choose foods with a percent daily value for sodium of less than 5% (as listed on the food label).  Use salt-free seasonings or herbs instead of table salt or sea salt.  Check with your health care provider or pharmacist before  using salt substitutes.  Eat lower-sodium products, often labeled as "lower sodium" or "no salt added."  Eat fresh foods.  Eat more vegetables, fruits, and low-fat dairy products.  Choose whole grains. Look for the word "whole" as the first word in the ingredient list.  Choose fish   Limit sweets, desserts, sugars, and sugary drinks.  Choose heart-healthy fats.  Eat veggie cheese   Eat more home-cooked food and less restaurant, buffet, and fast food.  Limit fried foods.  Cook foods using methods other than frying.  Limit canned vegetables. If you do use them, rinse them well to decrease the sodium.  When eating at a restaurant, ask that your food be prepared with less salt, or no salt if possible.                      WHAT FOODS CAN I EAT? Read Dr Joel Fuhrman's books on The End of Dieting & The End of Diabetes  Grains Whole grain or whole wheat bread. Brown rice. Whole grain or whole wheat pasta. Quinoa, bulgur, and whole grain cereals. Low-sodium cereals. Corn or whole wheat flour tortillas. Whole grain cornbread. Whole grain crackers. Low-sodium crackers.  Vegetables Fresh or frozen vegetables (raw, steamed, roasted, or grilled). Low-sodium or reduced-sodium tomato and vegetable juices. Low-sodium or reduced-sodium tomato sauce and paste. Low-sodium or reduced-sodium canned vegetables.   Fruits All fresh, canned (in natural juice), or frozen fruits.  Protein Products  All fish and seafood.  Dried beans, peas, or lentils. Unsalted nuts and seeds. Unsalted canned beans.  Dairy Low-fat dairy products, such as skim or 1% milk, 2% or reduced-fat cheeses, low-fat ricotta or cottage cheese, or plain low-fat yogurt. Low-sodium or reduced-sodium cheeses.  Fats and Oils Tub margarines without trans fats. Light or reduced-fat mayonnaise and salad dressings (reduced sodium). Avocado. Safflower, olive, or canola oils. Natural peanut or almond butter.  Other Unsalted popcorn  and pretzels. The items listed above may not be a complete list of recommended foods or beverages. Contact your dietitian for more options.  +++++++++++++++  WHAT FOODS ARE NOT RECOMMENDED? Grains/ White flour or wheat flour White bread. White pasta. White rice. Refined cornbread. Bagels and croissants. Crackers that contain trans fat.  Vegetables  Creamed or fried vegetables. Vegetables in a . Regular canned vegetables. Regular canned tomato sauce and paste. Regular tomato and vegetable juices.  Fruits Dried fruits. Canned fruit in light or heavy syrup. Fruit juice.  Meat and Other Protein Products Meat in general - RED meat & White meat.  Fatty cuts of meat. Ribs, chicken wings, all processed meats as bacon, sausage, bologna, salami, fatback, hot dogs, bratwurst and packaged luncheon meats.  Dairy Whole or 2% milk, cream, half-and-half, and cream cheese. Whole-fat or sweetened yogurt. Full-fat cheeses or blue cheese. Non-dairy creamers and whipped toppings. Processed cheese, cheese spreads, or cheese curds.  Condiments Onion and garlic salt, seasoned salt, table salt, and sea salt. Canned and packaged gravies. Worcestershire sauce. Tartar sauce. Barbecue sauce. Teriyaki sauce. Soy sauce, including reduced sodium. Steak sauce. Fish sauce. Oyster sauce. Cocktail   sauce. Horseradish. Ketchup and mustard. Meat flavorings and tenderizers. Bouillon cubes. Hot sauce. Tabasco sauce. Marinades. Taco seasonings. Relishes.  Fats and Oils Butter, stick margarine, lard, shortening and bacon fat. Coconut, palm kernel, or palm oils. Regular salad dressings.  Pickles and olives. Salted popcorn and pretzels.  The items listed above may not be a complete list of foods and beverages to avoid.   

## 2017-09-18 NOTE — Progress Notes (Signed)
This very nice 76 y.o. MBM presents for 6 month follow up with Hypertension, Hyperlipidemia, Pre-Diabetes and Vitamin D Deficiency. 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 He was discharged from in-patient on Sept 18 and continues to receive intensive therapies via home health. He continues to use a walker for security, but does ambulate some independently in his home.      Patient is treated for HTN circa 1980 & BP has been controlled at home. Today's BP is at goal - 126/74. Patient has had no complaints of any cardiac type chest pain, palpitations, dyspnea / orthopnea / PND, dizziness, claudication, or dependent edema.     Hyperlipidemia is controlled with diet & meds. Patient denies myalgias or other med SE's. And recently lipids were at goal:  Lab Results  Component Value Date   CHOL 147 07/26/2017   HDL 51 07/26/2017   LDLCALC 87 07/26/2017   TRIG 45 07/26/2017   CHOLHDL 2.9 07/26/2017      Also, the patient has history of T2_NIDDM (1994) controlled well over the years on Metformin /Glipizide.and has had no symptoms of reactive hypoglycemia, diabetic polys, paresthesias or visual blurring.  He had been resilient to monitor blood glucoses over all these years and persists with diabetic urine testing. Last A1c was not at goal: Lab Results  Component Value Date   HGBA1C 6.7 (H) 07/26/2017      Further, the patient also has history of Vitamin D Deficiency and supplements vitamin D without any suspected side-effects. Last vitamin D was   Lab Results  Component Value Date   VD25OH 68 04/14/2017   Current Outpatient Prescriptions on File Prior to Visit  Medication Sig  . apixaban (ELIQUIS) 5 MG TABS tablet Take 1 tablet (5 mg total) by mouth 2 (two) times daily.  [name] atorvastatin (LIPITOR) 40 MG tablet Take 1 tablet (40 mg total) by mouth daily at 6 PM.  . Cholecalciferol (VITAMIN D PO) Take 5,000 Units by mouth daily.  .  Cyanocobalamin (B-12) 50 MCG TABS Take 50 mcg by mouth daily.  . finasteride (PROSCAR) 5 MG tablet TAKE 1 TABLET BY MOUTH  EVERY DAY FOR PROSTATE  . glipiZIDE (GLUCOTROL) 5 MG tablet Take 1 tablet (5 mg total) by mouth 2 (two) times daily before a meal.  . levothyroxine (SYNTHROID, LEVOTHROID) 50 MCG tablet TAKE 1 TABLET BY MOUTH  DAILY BEFORE BREAKFAST  . lisinopril (PRINIVIL,ZESTRIL) 5 MG tablet Take 1 tablet (5 mg total) by mouth daily.  . Magnesium 250 MG TABS Take 250 mg by mouth daily.  . metFORMIN (GLUCOPHAGE-XR) 500 MG 24 hr tablet TAKE 1 TABLET BY MOUTH 4  TIMES DAILY FOR DIABETES  . Multiple Vitamin (MULTIVITAMIN) capsule Take 1 capsule by mouth daily.    . vitamin C (ASCORBIC ACID) 500 MG tablet Take 500 mg by mouth daily.     No current facility-administered medications on file prior to visit.    Allergies  Allergen Reactions  . Penicillins Other (See Comments)   PMHx:   Past Medical History:  Diagnosis Date  . BPH (benign prostatic hyperplasia)   . Diabetes mellitus ([place])   . Hyperlipidemia   . Hypertension   . Other testicular hypofunction   . Type II or unspecified type diabetes mellitus without mention of complication, not stated as uncontrolled    Immunization History  Administered Date(s) Administered  . DT 02/09/2015  . Influenza Split 08/07/2013  .  Influenza, High Dose Seasonal PF 08/19/2014, 08/28/2015, 09/13/2016, 08/15/2017  . Pneumococcal Conjugate-13 06/10/2016  . Pneumococcal Polysaccharide-23 08/13/2008  . Td 11/22/2003   Past Surgical History:  Procedure Laterality Date  . [place] and 2004  . LOOP RECORDER INSERTION N/A 07/28/2017   Procedure: LOOP RECORDER INSERTION;  Surgeon: Deboraha Sprang, MD;  Location: [facility];  Service: Cardiovascular;  Laterality: N/A;  . TEE WITHOUT CARDIOVERSION N/A 07/28/2017   Procedure: TRANSESOPHAGEAL ECHOCARDIOGRAM (TEE);  Surgeon: Sueanne Margarita, MD;  Location: [facility] ENDOSCOPY;   Service: Cardiovascular;  Laterality: N/A;   FHx:    Reviewed / unchanged  SHx:    Reviewed / unchanged  Systems Review:  Constitutional: Denies fever, chills, wt changes, headaches, insomnia, fatigue, night sweats, change in appetite. Eyes: Denies redness, blurred vision, diplopia, discharge, itchy, watery eyes.  ENT: Denies discharge, congestion, post nasal drip, epistaxis, sore throat, earache, hearing loss, dental pain, tinnitus, vertigo, sinus pain, snoring.  CV: Denies chest pain, palpitations, irregular heartbeat, syncope, dyspnea, diaphoresis, orthopnea, PND, claudication or edema. Respiratory: denies cough, dyspnea, DOE, pleurisy, hoarseness, laryngitis, wheezing.  Gastrointestinal: Denies dysphagia, odynophagia, heartburn, reflux, water brash, abdominal pain or cramps, nausea, vomiting, bloating, diarrhea, constipation, hematemesis, melena, hematochezia  or hemorrhoids. Genitourinary: Denies dysuria, frequency, urgency, nocturia, hesitancy, discharge, hematuria or flank pain. Musculoskeletal: Denies arthralgias, myalgias, stiffness, jt. swelling, pain, limping or strain/sprain.  Skin: Denies pruritus, rash, hives, warts, acne, eczema or change in skin lesion(s). Neuro: No weakness, tremor, incoordination, spasms, paresthesia or pain. Psychiatric: Denies confusion, memory loss or sensory loss. Endo: Denies change in weight, skin or hair change.  Heme/Lymph: No excessive bleeding, bruising or enlarged lymph nodes.  Physical Exam  BP 126/74   Pulse 72   Temp (!) 97.3 F (36.3 C)   Resp 16   Ht 5\' 11"  (1.803 m)   Wt 178 lb 6.4 oz (80.9 kg)   BMI 24.88 kg/m   Appears well nourished, well groomed  and in no distress.  Eyes: PERRLA, EOMs, conjunctiva no swelling or erythema. Sinuses: No frontal/maxillary tenderness ENT/Mouth: EAC's clear, TM's nl w/o erythema, bulging. Nares clear w/o erythema, swelling, exudates. Oropharynx clear without erythema or exudates. Oral hygiene is  good. Tongue normal, non obstructing. Hearing intact.  Neck: Supple. Thyroid nl. Car 2+/2+ without bruits, nodes or JVD. Chest: Respirations nl with BS clear & equal w/o rales, rhonchi, wheezing or stridor.  Cor: Heart sounds normal w/ regular rate and rhythm without sig. murmurs, gallops, clicks or rubs. Peripheral pulses normal and equal  without edema.  Abdomen: Soft & bowel sounds normal. Non-tender w/o guarding, rebound, hernias, masses or organomegaly.  Lymphatics: Unremarkable.  Musculoskeletal: Full ROM all peripheral extremities, joint stability, 5/5 strength and sl limping broad based gait favoring the LLE.  Skin: Warm, dry without exposed rashes, lesions or ecchymosis apparent.  Neuro: Cranial nerves intact, reflexes equal bilaterally. Sensory-motor testing grossly intact. Tendon reflexes grossly intact.  Pysch: Alert & oriented x 3.  Insight and judgement nl & appropriate. No ideations.  Assessment and Plan:  1. Essential hypertension  - Continue medication, monitor blood pressure at home.  - Continue DASH diet. Reminder to go to the ER if any CP,  SOB, nausea, dizziness, severe HA, changes vision/speech.  - CBC with Differential/Platelet - BASIC METABOLIC PANEL WITH GFR - Magnesium - TSH  2. Hyperlipidemia, mixed  - Continue diet/meds, exercise,& lifestyle modifications.  - Continue monitor periodic cholesterol/liver & renal functions  -  Hepatic function panel - Lipid panel - TSH  3. Type 2 diabetes mellitus with stage 2 chronic kidney disease, without long-term current use of insulin (HCC)  - Continue diet, exercise, lifestyle modifications.  - Monitor appropriate labs.  - Hemoglobin A1c - Insulin, random  4. Vitamin D deficiency  - Continue supplementation.  - VITAMIN D 25 Hydroxy   5. 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 Hypothyroidism  - TSH  7. Medication management       Discussed  regular  exercise, BP monitoring, weight control to achieve/maintain BMI less than 25 and discussed med and SE's. Recommended labs to assess and monitor clinical status with further disposition pending results of labs. Over 30 minutes of exam, counseling, chart review was performed.

## 2017-09-19 ENCOUNTER — Ambulatory Visit: Payer: Medicare Other | Admitting: Physical Therapy

## 2017-09-19 ENCOUNTER — Encounter: Payer: Self-pay | Admitting: Physical Therapy

## 2017-09-19 DIAGNOSIS — R2681 Unsteadiness on feet: Secondary | ICD-10-CM

## 2017-09-19 DIAGNOSIS — R270 Ataxia, unspecified: Secondary | ICD-10-CM | POA: Diagnosis not present

## 2017-09-19 DIAGNOSIS — R278 Other lack of coordination: Secondary | ICD-10-CM

## 2017-09-19 DIAGNOSIS — R2689 Other abnormalities of gait and mobility: Secondary | ICD-10-CM | POA: Diagnosis not present

## 2017-09-19 DIAGNOSIS — M6281 Muscle weakness (generalized): Secondary | ICD-10-CM

## 2017-09-19 LAB — HEPATIC FUNCTION PANEL
AG RATIO: 2 (calc) (ref 1.0–2.5)
ALBUMIN MSPROF: 4.1 g/dL (ref 3.6–5.1)
ALT: 15 U/L (ref 9–46)
AST: 16 U/L (ref 10–35)
Alkaline phosphatase (APISO): 80 U/L (ref 40–115)
Bilirubin, Direct: 0.2 mg/dL (ref 0.0–0.2)
GLOBULIN: 2.1 g/dL (ref 1.9–3.7)
Indirect Bilirubin: 0.5 mg/dL (calc) (ref 0.2–1.2)
Total Bilirubin: 0.7 mg/dL (ref 0.2–1.2)
Total Protein: 6.2 g/dL (ref 6.1–8.1)

## 2017-09-19 LAB — CBC WITH DIFFERENTIAL/PLATELET
BASOS ABS: 31 {cells}/uL (ref 0–200)
BASOS PCT: 0.6 %
EOS PCT: 1.6 %
Eosinophils Absolute: 82 cells/uL (ref 15–500)
HEMATOCRIT: 39.8 % (ref 38.5–50.0)
HEMOGLOBIN: 13.6 g/dL (ref 13.2–17.1)
LYMPHS ABS: 2081 {cells}/uL (ref 850–3900)
MCH: 28.6 pg (ref 27.0–33.0)
MCHC: 34.2 g/dL (ref 32.0–36.0)
MCV: 83.6 fL (ref 80.0–100.0)
MPV: 10.6 fL (ref 7.5–12.5)
Monocytes Relative: 9.8 %
NEUTROS ABS: 2407 {cells}/uL (ref 1500–7800)
Neutrophils Relative %: 47.2 %
Platelets: 244 10*3/uL (ref 140–400)
RBC: 4.76 10*6/uL (ref 4.20–5.80)
RDW: 13.1 % (ref 11.0–15.0)
Total Lymphocyte: 40.8 %
WBC mixed population: 500 cells/uL (ref 200–950)
WBC: 5.1 10*3/uL (ref 3.8–10.8)

## 2017-09-19 LAB — BASIC METABOLIC PANEL WITH GFR
BUN: 12 mg/dL (ref 7–25)
CALCIUM: 9.6 mg/dL (ref 8.6–10.3)
CO2: 29 mmol/L (ref 20–32)
CREATININE: 1 mg/dL (ref 0.70–1.18)
Chloride: 106 mmol/L (ref 98–110)
GFR, EST NON AFRICAN AMERICAN: 73 mL/min/{1.73_m2} (ref >=60)
GFR, Est African American: 84 mL/min/{1.73_m2} (ref >=60)
Glucose, Bld: 90 mg/dL (ref 65–99)
Potassium: 4.4 mmol/L (ref 3.5–5.3)
Sodium: 142 mmol/L (ref 135–146)

## 2017-09-19 LAB — MAGNESIUM: Magnesium: 1.9 mg/dL (ref 1.5–2.5)

## 2017-09-19 LAB — VITAMIN D 25 HYDROXY (VIT D DEFICIENCY, FRACTURES): Vit D, 25-Hydroxy: 81 ng/mL (ref 30–100)

## 2017-09-19 LAB — TSH: TSH: 0.04 m[IU]/L — AB (ref 0.40–4.50)

## 2017-09-19 NOTE — Therapy (Signed)
[facility] [address] [place], [place], [address] Phone: [phone]   Fax:  [phone]  Physical Therapy Treatment  Patient Details  Name: Earl [name] MRN: [id number] Date of Birth: Feb 16, 1941 Referring Provider: Dellie Catholic)  Encounter Date: 09/19/2017      PT End of Session - 09/19/17 2207    Visit Number 9   Number of Visits 18   Date for PT Re-Evaluation 10/09/17   Authorization Type [employer] Medicare-GCODE and progress note due every 10th visit   PT Start Time 1315   PT Stop Time 1400   PT Time Calculation (min) 45 min   Equipment Utilized During Treatment Gait belt   Activity Tolerance Patient tolerated treatment well   Behavior During Therapy [facility] for tasks assessed/performed      Past Medical History:  Diagnosis Date  . BPH (benign prostatic hyperplasia)   . Diabetes mellitus ([place])   . Hyperlipidemia   . Hypertension   . Other testicular hypofunction   . Type II or unspecified type diabetes mellitus without mention of complication, not stated as uncontrolled     Past Surgical History:  Procedure Laterality Date  . [place] and 2004  . LOOP RECORDER INSERTION N/A 07/28/2017   Procedure: LOOP RECORDER INSERTION;  Surgeon: Deboraha Sprang, MD;  Location: [facility];  Service: Cardiovascular;  Laterality: N/A;  . TEE WITHOUT CARDIOVERSION N/A 07/28/2017   Procedure: TRANSESOPHAGEAL ECHOCARDIOGRAM (TEE);  Surgeon: Sueanne Margarita, MD;  Location: [facility] ENDOSCOPY;  Service: Cardiovascular;  Laterality: N/A;    There were no vitals filed for this visit.      Subjective Assessment - 09/19/17 1317    Subjective No falls. New exercise is going well.    Pertinent History DM, PAF, HTN   Patient Stated Goals Work on balance, walking and get back to independent   Currently in Pain? No/denies      Feet Partial Heel-Toe, Head Motion - Eyes Open    With eyes  open, right foot partially in front of the other (if gets easy put right foot in back),  move head slowly: right /left, up/ down, diagonal up-right/down-left, diagonal up-left/down-right. Repeat __10_ times each direction.   Copyright  VHI. All rights reserved.  Feet Together, Head Motion - Eyes Closed    With eyes closed and feet together, move head slowly: right /left, up/ down, diagonal up-right/down-left, diagonal up-left/down-right. Repeat __10_ times each direction.   Copyright  VHI. All rights reserved.   Feet Together (Compliant Surface) Head Motion - Eyes Open    With eyes open, standing on compliant surface: ____foam or pillow____, feet together,  move head slowly: right /left, up/ down, diagonal up-right/down-left, diagonal up-left/down-right. Repeat __10_ times each direction.   Copyright  VHI. All rights reserved.   Feet Apart (Compliant Surface) Head Motion - Eyes Closed    Stand on compliant surface: _foam or pillow_______ with feet shoulder width apart. Close eyes  move head slowly: right /left, up/ down, diagonal up-right/down-left, diagonal up-left/down-right. Repeat __10_ times each direction.   Copyright  VHI. All rights reserved.                     [place] Adult PT Treatment/Exercise - 09/19/17 1315      Ambulation/Gait   Ambulation/Gait Yes   Ambulation/Gait Assistance 5: Supervision   Ambulation/Gait Assistance Details Verbal cues on posture & step length   Ambulation Distance (  Feet) 400 Feet   Assistive device Straight cane  quad tip   Ambulation Surface Indoor;Level   Stairs Yes   Stairs Assistance 5: Supervision   Stairs Assistance Details (indicate cue type and reason) cane location   Stair Management Technique One rail Left;One rail Right;With cane;Alternating pattern;Forwards   Number of Stairs 4  3 reps varying single rail location   Ramp 5: Supervision  cane with quad tip   Ramp Details (indicate cue type and  reason) verbal cues on posture & wt shift   Curb 5: Supervision  cane with quad tip   Curb Details (indicate cue type and reason) verbal cues on sequence & step thru   Gait Comments worked on carrying plate with objects with cane then carrying full cup of water.                 PT Education - 09/19/17 1400    Education provided Yes   Education Details corner balance activities, [employer]) Educated Patient   Methods Explanation;Demonstration;Tactile cues;Verbal cues;Handout   Comprehension Verbalized understanding;Returned demonstration;Verbal cues required;Tactile cues required          PT Short Term Goals - 09/11/17 1322      PT SHORT TERM GOAL #1   Title Pt will be independent with HEP for improved balance, strength, and gait.  TARGET 09/08/17   Time 5   Period Weeks   Status Achieved     PT SHORT TERM GOAL #2   Title Pt will perform at least 6 of 10 reps of sit<>stand with minimal to no UE support for improved transfer efficiency and safety   Time 5   Period Weeks   Status Achieved     PT SHORT TERM GOAL #3   Title Pt will improve Berg Balance score to at least 34/56 for decreased fall risk.   Baseline 44/56 on 09/11/17   Time 5   Period Weeks   Status Achieved     PT SHORT TERM GOAL #4   Title Pt will improve TUG score to less than or equal to 28 seconds for decreased fall risk.   Baseline 22.54 sec with cane, 20.66 sec with RW 09/11/17   Time 5   Period Weeks   Status Achieved     PT SHORT TERM GOAL #5   Title Pt will verbalize understanding of CVA warning signs and symptoms.   Time 5   Period Weeks   Status Achieved           PT Long Term Goals - 09/11/17 1412      PT LONG TERM GOAL #1   Title Pt/wife will verbalize understanding of fall prevention in the home environment.  TARGET 10/06/17   Time 9   Period Weeks   Status On-going   Target Date 10/06/17     PT LONG TERM GOAL #2   Title Pt will improve gait velocity to at  least 1.8 ft/sec for decreased fall risk/improved efficiency and safety with gait.   Time 9   Period Weeks   Status On-going   Target Date 10/06/17     PT LONG TERM GOAL #3   Title Pt will improve Berg Balance score to at least 42/56 for decreased fall risk.  Upgrading goal to [place] >49/56 for decreased fall risk   Time 9   Period Weeks   Status Revised   Target Date 10/06/17     PT LONG TERM GOAL #4  Title Pt will improve TUG score to less than or equal to 20 seconds for decreased fall risk.  Upgrading goal to TUG < or equal to 15 seconds for decreased fall risk.   Time 9   Period Weeks   Status Revised   Target Date 10/06/17     PT LONG TERM GOAL #5   Title Pt will ambulate at least 50-100 ft using cane, with supervision for improved independence with gait with lesser assistive device.  Upgrading goal to at least 500 ft, indoor and outdoor with cane, supervision.   Time 9   Period Weeks   Status Revised   Target Date 10/06/17               Plan - 09/19/17 2208    Clinical Impression Statement Patient appears to understand updated exercise program of balance exercises in corner for safety. Patient appears safe for gait with cane with quad tip with supervision.    Rehab Potential Good   PT Frequency --  1x/wk for 1 week, then 2x/wk for 8 weeks   PT Duration Other (comment)  POC = 9 weeks   PT Treatment/Interventions ADLs/Self Care Home Management;Functional mobility training;Gait training;Therapeutic activities;DME Instruction;Therapeutic exercise;Balance training;Neuromuscular re-education;Patient/family education;Orthotic Fit/Training   PT Next Visit Plan do G-code with gait velocity, TUG & Berg. Gait with cane with quad tip including outdoors if weather permits, ramps, curbs, scanning, direction changes.   Consulted and Agree with Plan of Care Patient      Patient will benefit from skilled therapeutic intervention in order to improve the following deficits and  impairments:  Abnormal gait, Decreased balance, Decreased mobility, Decreased strength, Difficulty walking  Visit Diagnosis: Muscle weakness (generalized)  Unsteadiness on feet  Other lack of coordination  Ataxia  Other abnormalities of gait and mobility     Problem List Patient Active Problem List   Diagnosis Date Noted  . Chronic anticoagulation 09/04/2017  . Acute blood loss anemia   . Labile blood glucose   . PAF (paroxysmal atrial fibrillation) ([place])   . Hypoalbuminemia due to protein-calorie malnutrition ([place])   . Diabetes mellitus ([place])   . Benign essential HTN   . Ataxia, post-stroke   . 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 Dizziness 07/25/2017  . Hypothyroidism 09/13/2016  . BMI 26.53,adult 08/28/2015  . Medicare annual wellness visit, subsequent 08/28/2015  . 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 Vitamin D deficiency 04/29/2014  . Medication management 01/03/2014  . 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 BPH (benign prostatic hyperplasia)   . Testosterone deficiency   . 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 Anemia 11/07/2013  . Essential hypertension 11/07/2013  . Hyperlipidemia 11/07/2013  . Paroxysmal atrial fibrillation ([place]) 09/18/2012    Coralie Stanke PT, DPT 09/19/2017, 10:14 PM  [facility] [address] [place], [place], [address] Phone: [phone]   Fax:  [phone]  Name: Earl [name] MRN: [id number] Date of Birth: 12/01/1940

## 2017-09-19 NOTE — Patient Instructions (Addendum)
Feet Partial Heel-Toe, Head Motion - Eyes Open    With eyes open, right foot partially in front of the other (if gets easy put right foot in back),  move head slowly: right /left, up/ down, diagonal up-right/down-left, diagonal up-left/down-right. Repeat __10_ times each direction.   Copyright  VHI. All rights reserved.  Feet Together, Head Motion - Eyes Closed    With eyes closed and feet together, move head slowly: right /left, up/ down, diagonal up-right/down-left, diagonal up-left/down-right. Repeat __10_ times each direction.   Copyright  VHI. All rights reserved.   Feet Together (Compliant Surface) Head Motion - Eyes Open    With eyes open, standing on compliant surface: ____foam or pillow____, feet together,  move head slowly: right /left, up/ down, diagonal up-right/down-left, diagonal up-left/down-right. Repeat __10_ times each direction.   Copyright  VHI. All rights reserved.   Feet Apart (Compliant Surface) Head Motion - Eyes Closed    Stand on compliant surface: _foam or pillow_______ with feet shoulder width apart. Close eyes  move head slowly: right /left, up/ down, diagonal up-right/down-left, diagonal up-left/down-right. Repeat __10_ times each direction.   Copyright  VHI. All rights reserved.

## 2017-09-21 ENCOUNTER — Ambulatory Visit: Payer: Medicare Other | Attending: Physical Medicine & Rehabilitation | Admitting: Physical Therapy

## 2017-09-21 DIAGNOSIS — M6281 Muscle weakness (generalized): Secondary | ICD-10-CM

## 2017-09-21 DIAGNOSIS — R2689 Other abnormalities of gait and mobility: Secondary | ICD-10-CM

## 2017-09-21 DIAGNOSIS — R2681 Unsteadiness on feet: Secondary | ICD-10-CM | POA: Diagnosis not present

## 2017-09-21 NOTE — Therapy (Signed)
[facility] [address] [place], [place], [address] Phone: [phone]   Fax:  [phone]  Physical Therapy Treatment  Patient Details  Name: Earl [name] MRN: [id number] Date of Birth: [birth year] Referring Provider: Dellie Catholic)  Encounter Date: 09/21/2017      PT End of Session - 09/21/17 2142    Visit Number 10   Number of Visits 18   Date for PT Re-Evaluation 10/09/17   Authorization Type UHC Medicare-GCODE and progress note due every 10th visit   PT Start Time 0934   PT Stop Time 1014   PT Time Calculation (min) 40 min   Equipment Utilized During Treatment Gait belt   Activity Tolerance Patient tolerated treatment well   Behavior During Therapy [facility] for tasks assessed/performed      Past Medical History:  Diagnosis Date  . BPH (benign prostatic hyperplasia)   . Diabetes mellitus ([place])   . Hyperlipidemia   . Hypertension   . Other testicular hypofunction   . Type II or unspecified type diabetes mellitus without mention of complication, not stated as uncontrolled     Past Surgical History:  Procedure Laterality Date  . [place] and 2004  . LOOP RECORDER INSERTION N/A 07/28/2017   Procedure: LOOP RECORDER INSERTION;  Surgeon: Deboraha Sprang, MD;  Location: [facility];  Service: Cardiovascular;  Laterality: N/A;  . TEE WITHOUT CARDIOVERSION N/A 07/28/2017   Procedure: TRANSESOPHAGEAL ECHOCARDIOGRAM (TEE);  Surgeon: Sueanne Margarita, MD;  Location: [facility] ENDOSCOPY;  Service: Cardiovascular;  Laterality: N/A;    There were no vitals filed for this visit.      Subjective Assessment - 09/21/17 0936    Subjective No falls, no changes.   Pertinent History DM, PAF, HTN   Patient Stated Goals Work on balance, walking and get back to independent   Currently in Pain? No/denies                         [facility] Adult PT Treatment/Exercise - 09/21/17  0951      Ambulation/Gait   Ambulation/Gait Yes   Ambulation/Gait Assistance 5: Supervision   Ambulation/Gait Assistance Details Gait indoors with environmental scanning tasks   Ambulation Distance (Feet) 350 Feet  with head turns; 50 ft, 80 ft x 2, then 120   Assistive device Straight cane  small point tip   Gait Pattern Step-through pattern;Decreased step length - right;Decreased step length - left;Wide base of support;Ataxic;Decreased weight shift to right   Ambulation Surface Level;Indoor   Gait velocity 19.72 sec = 1.66 ft/sec   Stairs Yes   Stairs Assistance 5: Supervision   Stairs Assistance Details (indicate cue type and reason) cane location   Stair Management Technique One rail Left;One rail Right;With cane;Alternating pattern;Forwards   Number of Stairs 4   Gait Comments With gait with head turns, gait is slowed, but no overt LOB noted.  Discussed patient using cane with small quad tip at home for household distances, continue to use RW for outdoor and longer distances.     Timed Up and Go Test   TUG Normal TUG   Normal TUG (seconds) 20.12  with cane             Balance Exercises - 09/21/17 2131      Balance Exercises: Standing   Standing Eyes Opened Narrow base of support (BOS);Foam/compliant surface;Head turns;5 reps  Head nods, diagonals  Standing Eyes Closed Narrow base of support (BOS);Wide (BOA);Solid surface;Foam/compliant surface;Head turns;5 reps  Head nods, diagonals   Tandem Gait Forward;Retro;Intermittent upper extremity support;3 reps  progress to tandem marching   Partial Tandem Stance Eyes open;5 reps  head turns, nods, diagonal head turns   Retro Gait Upper extremity support;3 reps;Other (comment)  Forward/back walking   Sidestepping 3 reps  along counter, 3 reps each direction   Marching Limitations Marching forward along counter, x 3 reps   Other Standing Exercises Standing on pillows:  marching in place x 10 reps, forward kicks x 10 reps.   Alternating step taps to 6" step, 12" step x 10 reps each leg for improved single limb stance.     Review of HEP given last visit.  Pt return demo understanding.      PT Education - 09/21/17 2140    Education provided Yes   Education Details Reviewed HEP from last visit; discussed progression to using cane with quadruped tip for short household distances   Person(s) Educated Patient   Methods Explanation   Comprehension Verbalized understanding;Returned demonstration          PT Short Term Goals - 09/11/17 1322      PT SHORT TERM GOAL #1   Title Pt will be independent with HEP for improved balance, strength, and gait.  TARGET 09/08/17   Time 5   Period Weeks   Status Achieved     PT SHORT TERM GOAL #2   Title Pt will perform at least 6 of 10 reps of sit<>stand with minimal to no UE support for improved transfer efficiency and safety   Time 5   Period Weeks   Status Achieved     PT SHORT TERM GOAL #3   Title Pt will improve Berg Balance score to at least 34/56 for decreased fall risk.   Baseline 44/56 on 09/11/17   Time 5   Period Weeks   Status Achieved     PT SHORT TERM GOAL #4   Title Pt will improve TUG score to less than or equal to 28 seconds for decreased fall risk.   Baseline 22.54 sec with cane, 20.66 sec with RW 09/11/17   Time 5   Period Weeks   Status Achieved     PT SHORT TERM GOAL #5   Title Pt will verbalize understanding of CVA warning signs and symptoms.   Time 5   Period Weeks   Status Achieved           PT Long Term Goals - 09/11/17 1412      PT LONG TERM GOAL #1   Title Pt/wife will verbalize understanding of fall prevention in the home environment.  TARGET 10/06/17   Time 9   Period Weeks   Status On-going   Target Date 10/06/17     PT LONG TERM GOAL #2   Title Pt will improve gait velocity to at least 1.8 ft/sec for decreased fall risk/improved efficiency and safety with gait.   Time 9   Period Weeks   Status On-going    Target Date 10/06/17     PT LONG TERM GOAL #3   Title Pt will improve Berg Balance score to at least 42/56 for decreased fall risk.  Upgrading goal to [place] >49/56 for decreased fall risk   Time 9   Period Weeks   Status Revised   Target Date 10/06/17     PT LONG TERM GOAL #4   Title Pt will improve  TUG score to less than or equal to 20 seconds for decreased fall risk.  Upgrading goal to TUG < or equal to 15 seconds for decreased fall risk.   Time 9   Period Weeks   Status Revised   Target Date 10/06/17     PT LONG TERM GOAL #5   Title Pt will ambulate at least 50-100 ft using cane, with supervision for improved independence with gait with lesser assistive device.  Upgrading goal to at least 500 ft, indoor and outdoor with cane, supervision.   Time 9   Period Weeks   Status Revised   Target Date 10/06/17               Plan - 2017/10/03 2143-02-07    Clinical Impression Statement Pt continues to progress with gait and stability using cane with quad tip in therapy session, with supervision.  Recommend transition to cane with quad tip with supervision at home, for household distances.  Continue to recommend use of RW for longer distances. pt will continue to beenfit from skilled PT to address balance and gait.   Rehab Potential Good   PT Frequency --  1x/wk for 1 week, then 2x/wk for 8 weeks   PT Duration Other (comment)  POC = 9 weeks   PT Treatment/Interventions ADLs/Self Care Home Management;Functional mobility training;Gait training;Therapeutic activities;DME Instruction;Therapeutic exercise;Balance training;Neuromuscular re-education;Patient/family education;Orthotic Fit/Training   PT Next Visit Plan Continue gait with cane with quad tip including outdoors if weather permits, ramps, curbs, scanning, direction changes; balance and strengthening   Consulted and Agree with Plan of Care Patient      Patient will benefit from skilled therapeutic intervention in order to improve the  following deficits and impairments:  Abnormal gait, Decreased balance, Decreased mobility, Decreased strength, Difficulty walking  Visit Diagnosis: Unsteadiness on feet  Muscle weakness (generalized)  Other abnormalities of gait and mobility       G-Codes - 2017/10/03 Feb 06, 2145    Functional Assessment Tool Used (Outpatient Only) gait velocity 1.66 ft/sec, TUG 20.12 sec with cane; Berg 44/56; DGI 13/24   Functional Limitation Mobility: Walking and moving around   Mobility: Walking and Moving Around Current Status [phone]) At least 40 percent but less than 60 percent impaired, limited or restricted   Mobility: Walking and Moving Around Goal Status [phone]) At least 20 percent but less than 40 percent impaired, limited or restricted      Problem List Patient Active Problem List   Diagnosis Date Noted  . Chronic anticoagulation 09/04/2017  . Acute blood loss anemia   . Labile blood glucose   . PAF (paroxysmal atrial fibrillation) ([place])   . Hypoalbuminemia due to protein-calorie malnutrition ([place])   . Diabetes mellitus ([place])   . Benign essential HTN   . Ataxia, post-stroke   . 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 Dizziness 07/25/2017  . Hypothyroidism 09/13/2016  . BMI 26.53,adult 08/28/2015  . Medicare annual wellness visit, subsequent 08/28/2015  . 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 Vitamin D deficiency 04/29/2014  . Medication management 01/03/2014  . 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 BPH (benign prostatic hyperplasia)   . Testosterone deficiency   . 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 Anemia 11/07/2013  . Essential hypertension 11/07/2013  . Hyperlipidemia 11/07/2013  . Paroxysmal atrial fibrillation ([place]) 09/18/2012    Vuk Skillern W. 2017-10-03, 9:48 PM  Frazier Butt., PT   [facility]  [facility] [address] [place], [place], [address] Phone: [phone]   Fax:  [phone]  Name: Delmon Andrada MRN: [id number] Date of Birth: 08/03/1941   Physical Therapy Progress Note  Dates of Reporting Period: 08/10/17 to 09/21/17  Objective Reports of Subjective Statement: progression to gait training with cane with quad tip; Berg, TUG, gait velocity  Objective Measurements: Berg 44/56, TUG 20.12 sec with cane; gait velocity 1.66 ft/sec; DGI 13/24  Goal Update: Pt has met all STGs and is progressing towards LTGs.  Plan: Continue gait training, strengthening, balance, with progression towards improved independence with gait.  Reason Skilled Services are Required: Pt has improved balance and gait, as evidenced by improved Berg, TUG scores, transition to cane with quad tip for household distances.  Pt remains at fall risk per Berg, DGI, and TUG scores.  Pt will continue to benefit from skilled PT to further address gait, strengthening, and balance for improved functional mobility and independence.  Mady Haagensen, PT 09/21/17 9:54 PM Phone: [phone] Fax: [phone]

## 2017-09-25 ENCOUNTER — Ambulatory Visit: Payer: Medicare Other | Admitting: Physical Therapy

## 2017-09-25 ENCOUNTER — Encounter: Payer: Self-pay | Admitting: Physical Therapy

## 2017-09-25 ENCOUNTER — Encounter: Payer: Self-pay | Admitting: Gastroenterology

## 2017-09-25 DIAGNOSIS — R2681 Unsteadiness on feet: Secondary | ICD-10-CM | POA: Diagnosis not present

## 2017-09-25 DIAGNOSIS — M6281 Muscle weakness (generalized): Secondary | ICD-10-CM | POA: Diagnosis not present

## 2017-09-25 DIAGNOSIS — R2689 Other abnormalities of gait and mobility: Secondary | ICD-10-CM | POA: Diagnosis not present

## 2017-09-25 NOTE — Patient Instructions (Signed)
Verbal instructions given for standing hip extension x 10, standing hip abduction x 10, standing hip flexion x 10 reps, with green theraband for resistance, tied at ankles.

## 2017-09-25 NOTE — Therapy (Signed)
[facility] [address] [place], [place], [address] Phone: [phone]   Fax:  [phone]  Physical Therapy Treatment  Patient Details  Name: Earl [name] MRN: [id number] Date of Birth: [birth year] Referring Provider: Dellie Catholic)   Encounter Date: 09/25/2017  PT End of Session - 09/25/17 2138    Visit Number  11    Number of Visits  18    Date for PT Re-Evaluation  10/09/17    Authorization Type  UHC Medicare-GCODE and progress note due every 10th visit    PT Start Time  0936    PT Stop Time  1018    PT Time Calculation (min)  42 min    Equipment Utilized During Treatment  Gait belt    Activity Tolerance  Patient tolerated treatment well    Behavior During Therapy  [facility] for tasks assessed/performed       Past Medical History:  Diagnosis Date  . BPH (benign prostatic hyperplasia)   . Diabetes mellitus ([place])   . Hyperlipidemia   . Hypertension   . Other testicular hypofunction   . Type II or unspecified type diabetes mellitus without mention of complication, not stated as uncontrolled     Past Surgical History:  Procedure Laterality Date  . [place] and 2004    There were no vitals filed for this visit.  Subjective Assessment - 09/25/17 2127    Subjective  Used cane around home this weekend without difficulty.    Patient is accompained by:  Family member wife   wife   Pertinent History  DM, PAF, HTN    Patient Stated Goals  Work on balance, walking and get back to independent    Currently in Pain?  No/denies                      [facility] Adult PT Treatment/Exercise - 09/25/17 0001      Ambulation/Gait   Ambulation/Gait  Yes    Ambulation/Gait Assistance  5: Supervision    Ambulation/Gait Assistance Details  Short distance gait activities with focus on head turns, head nods, relaxed RUE arm swing    Ambulation Distance (Feet)  50 Feet x 5 reps,  then 160 ft   x 5 reps, then 160 ft   Assistive device  Straight cane with small quad point tip   with small quad point tip   Gait Pattern  Step-through pattern;Decreased step length - right;Decreased step length - left;Wide base of support;Ataxic;Decreased weight shift to right    Ambulation Surface  Level;Indoor    Ramp  5: Supervision cane with quad tip   cane with quad tip   Ramp Details (indicate cue type and reason)  verbal cues for posture weightshift, x 3 reps    Curb  5: Supervision cane with quad tip   cane with quad tip   Curb Details (indicate cue type and reason)  verbal cues for sequence, x 5 reps    Gait Comments  Discussed at end of session, with patient and wife, for patient to try using cane to come into and out of home to car and into/out of therapy using quad tip cane with wife's supervision.      Knee/Hip Exercises: Standing   Heel Raises  Both;1 set;20 reps /toe raises   /toe raises   Hip Flexion  Stengthening;Right;Left;1 set;10 reps;Knee straight green theraband   green theraband   Hip Abduction  Stengthening;Right;Left;1 set;10 reps;Knee straight green theraband   green theraband   Hip Extension  Stengthening;Right;Left;1 set;10 reps;Knee straight green theraband   green theraband   Other Standing Knee Exercises  Resisted sidestepping, with green theraband, x 2 reps each direction in parallel bars          Balance Exercises - 09/25/17 2128      Balance Exercises: Standing   Rockerboard  Anterior/posterior;Head turns;EO;UE support Head nods   Head nods   Tandem Gait  Forward;Retro;Intermittent upper extremity support;3 reps in parallel bars   in parallel bars   Retro Gait  Upper extremity support;3 reps;Other (comment) Forward/back walking in parallel bars   Forward/back walking in parallel bars   Sidestepping  3 reps in parallel bars, each direction   in parallel bars, each direction   Marching Limitations  Marching forward in parallel bars, 3 reps;  progressed to tandem marching 3 reps        PT Education - 09/25/17 2137    Education provided  Yes    Education Details  Addition of green theraband exercises for resisted hip extension, flexion, abduction; progression to using quad tip cane for into and out of home and therapy with wife's supervision    Person(s) Educated  Patient    Methods  Explanation;Demonstration    Comprehension  Verbalized understanding;Returned demonstration       PT Short Term Goals - 09/11/17 1322      PT SHORT TERM GOAL #1   Title  Pt will be independent with HEP for improved balance, strength, and gait.  TARGET 09/08/17    Time  5    Period  Weeks    Status  Achieved      PT SHORT TERM GOAL #2   Title  Pt will perform at least 6 of 10 reps of sit<>stand with minimal to no UE support for improved transfer efficiency and safety    Time  5    Period  Weeks    Status  Achieved      PT SHORT TERM GOAL #3   Title  Pt will improve Berg Balance score to at least 34/56 for decreased fall risk.    Baseline  44/56 on 09/11/17    Time  5    Period  Weeks    Status  Achieved      PT SHORT TERM GOAL #4   Title  Pt will improve TUG score to less than or equal to 28 seconds for decreased fall risk.    Baseline  22.54 sec with cane, 20.66 sec with RW 09/11/17    Time  5    Period  Weeks    Status  Achieved      PT SHORT TERM GOAL #5   Title  Pt will verbalize understanding of CVA warning signs and symptoms.    Time  5    Period  Weeks    Status  Achieved        PT Long Term Goals - 09/11/17 1412      PT LONG TERM GOAL #1   Title  Pt/wife will verbalize understanding of fall prevention in the home environment.  TARGET 10/06/17    Time  9    Period  Weeks    Status  On-going    Target Date  10/06/17      PT LONG TERM GOAL #2   Title  Pt will improve gait velocity to at least 1.8 ft/sec for decreased fall risk/improved  efficiency and safety with gait.    Time  9    Period  Weeks    Status   On-going    Target Date  10/06/17      PT LONG TERM GOAL #3   Title  Pt will improve Berg Balance score to at least 42/56 for decreased fall risk.  Upgrading goal to [place] >49/56 for decreased fall risk    Time  9    Period  Weeks    Status  Revised    Target Date  10/06/17      PT LONG TERM GOAL #4   Title  Pt will improve TUG score to less than or equal to 20 seconds for decreased fall risk.  Upgrading goal to TUG < or equal to 15 seconds for decreased fall risk.    Time  9    Period  Weeks    Status  Revised    Target Date  10/06/17      PT LONG TERM GOAL #5   Title  Pt will ambulate at least 50-100 ft using cane, with supervision for improved independence with gait with lesser assistive device.  Upgrading goal to at least 500 ft, indoor and outdoor with cane, supervision.    Time  9    Period  Weeks    Status  Revised    Target Date  10/06/17            Plan - 09/25/17 2139    Clinical Impression Statement  Skilled PT session focused on balance, leg strengthening and gait training with quad tip cane today.  Continue to require supervision for dynamic gait tasks (head turns, nods, ramp/curbs) with quad tip cane.  Pt continues to be motivated for therapy and will continue to benefit from skilled PT to improve functional mobility and strength.    Rehab Potential  Good    PT Frequency  -- 1x/wk for 1 week, then 2x wk for 8 weeks   1x/wk for 1 week, then 2x wk for 8 weeks   PT Duration  -- POC= 9 weeks   POC= 9 weeks   PT Treatment/Interventions  ADLs/Self Care Home Management;Functional mobility training;Gait training;Therapeutic activities;DME Instruction;Therapeutic exercise;Balance training;Neuromuscular re-education;Patient/family education;Orthotic Fit/Training    PT Next Visit Plan  Continue gait training with quad tip cane outdoors, direction changes, balance and review theraband exercises given for hip strengthening.    Consulted and Agree with Plan of Care   Patient;Family member/caregiver    Family Member Consulted  wife       Patient will benefit from skilled therapeutic intervention in order to improve the following deficits and impairments:  Abnormal gait, Decreased balance, Decreased mobility, Decreased strength, Difficulty walking  Visit Diagnosis: Unsteadiness on feet  Muscle weakness (generalized)  Other abnormalities of gait and mobility     Problem List Patient Active Problem List   Diagnosis Date Noted  . Chronic anticoagulation 09/04/2017  . Acute blood loss anemia   . Labile blood glucose   . PAF (paroxysmal atrial fibrillation) ([place])   . Hypoalbuminemia due to protein-calorie malnutrition ([place])   . Diabetes mellitus ([place])   . Benign essential HTN   . Ataxia, post-stroke   . 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 Dizziness 07/25/2017  . Hypothyroidism 09/13/2016  . BMI 26.53,adult 08/28/2015  . Medicare annual wellness visit, subsequent 08/28/2015  . 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 Vitamin D deficiency 04/29/2014  .  Medication management 01/03/2014  . 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 BPH (benign prostatic hyperplasia)   . Testosterone deficiency   . 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 Anemia 11/07/2013  . Essential hypertension 11/07/2013  . Hyperlipidemia 11/07/2013  . Paroxysmal atrial fibrillation ([place]) 09/18/2012    Sheretta Grumbine W. 09/25/2017, 9:43 PM  Frazier Butt., PT   [facility] [address] [place], [place], [address] Phone: [phone]   Fax:  [phone]  Name: Alphonso Gregson MRN: [id number] Date of Birth: [birth year]

## 2017-09-26 ENCOUNTER — Ambulatory Visit (INDEPENDENT_AMBULATORY_CARE_PROVIDER_SITE_OTHER): Payer: Medicare Other | Admitting: *Deleted

## 2017-09-26 DIAGNOSIS — I639 Cerebral infarction, unspecified: Secondary | ICD-10-CM | POA: Diagnosis not present

## 2017-09-27 DIAGNOSIS — E119 Type 2 diabetes mellitus without complications: Secondary | ICD-10-CM | POA: Diagnosis not present

## 2017-09-27 NOTE — Progress Notes (Signed)
[facility] Summary Report / Loop Recorder 

## 2017-09-28 LAB — CUP PACEART REMOTE DEVICE CHECK
Date Time Interrogation Session: 20181106210659
Implantable Pulse Generator Implant Date: 20180907

## 2017-09-29 ENCOUNTER — Ambulatory Visit: Payer: Medicare Other | Admitting: Physical Therapy

## 2017-09-29 ENCOUNTER — Telehealth: Payer: Self-pay | Admitting: *Deleted

## 2017-09-29 ENCOUNTER — Encounter: Payer: Self-pay | Admitting: Physical Therapy

## 2017-09-29 ENCOUNTER — Other Ambulatory Visit: Payer: Self-pay | Admitting: Internal Medicine

## 2017-09-29 DIAGNOSIS — M6281 Muscle weakness (generalized): Secondary | ICD-10-CM

## 2017-09-29 DIAGNOSIS — R2681 Unsteadiness on feet: Secondary | ICD-10-CM | POA: Diagnosis not present

## 2017-09-29 DIAGNOSIS — R2689 Other abnormalities of gait and mobility: Secondary | ICD-10-CM

## 2017-09-29 NOTE — Therapy (Signed)
[facility] [address] [place], [place], [address] Phone: [phone]   Fax:  [phone]  Physical Therapy Treatment  Patient Details  Name: Earl [name] MRN: [id number] Date of Birth: [birth year] Referring Provider: Dellie Catholic)   Encounter Date: 09/29/2017  PT End of Session - 09/29/17 1201    Visit Number  12    Number of Visits  18    Date for PT Re-Evaluation  10/09/17    Authorization Type  [employer] Medicare-GCODE and progress note due every 10th visit    PT Start Time  1058    PT Stop Time  1145    PT Time Calculation (min)  47 min    Equipment Utilized During Treatment  Gait belt    Activity Tolerance  Patient tolerated treatment well    Behavior During Therapy  [facility] for tasks assessed/performed       Past Medical History:  Diagnosis Date  . BPH (benign prostatic hyperplasia)   . Diabetes mellitus ([place])   . Hyperlipidemia   . Hypertension   . Other testicular hypofunction   . Type II or unspecified type diabetes mellitus without mention of complication, not stated as uncontrolled     Past Surgical History:  Procedure Laterality Date  . [place] and 2004    There were no vitals filed for this visit.  Subjective Assessment - 09/29/17 1056    Subjective  Just fine with the cane.  No pain today    Patient is accompained by:  Family member wife    Pertinent History  DM, PAF, HTN    Patient Stated Goals  Work on balance, walking and get back to independent    Currently in Pain?  No/denies                      [facility] Adult PT Treatment/Exercise - 09/29/17 0001      Ambulation/Gait   Ambulation/Gait  Yes    Ambulation/Gait Assistance  5: Supervision    Ambulation/Gait Assistance Details  Short distance gait on red compliant mat surfaces with cane with small quad tip, including turns to change direction    Ambulation Distance (Feet)  230 Feet 115  ft, then 200 ft    Assistive device  Straight cane with small quad tip    Gait Pattern  Step-through pattern;Decreased step length - right;Decreased step length - left;Wide base of support;Ataxic;Decreased weight shift to right    Ambulation Surface  Level;Indoor    Ramp  5: Supervision cane with quad tip    Ramp Details (indicate cue type and reason)  Initial cues for shortened step length on ramp for improved stability    Curb  5: Supervision cane with quad tip    Gait Comments  Gait with head turns, gait with cues for change of direction.  PT provides cues to increase stance time on RLE and increase step length on LLE.      Knee/Hip Exercises: Standing   Hip Flexion  Stengthening;Right;Left;1 set;10 reps;Knee straight green theraband    Hip Abduction  Stengthening;Right;Left;1 set;10 reps;Knee straight green theraband    Hip Extension  Stengthening;Right;Left;1 set;10 reps;Knee straight green theraband    Functional Squat  2 sets;10 reps      Reviewed HEP from last visit with green theraband hip strengthening.  Advised that patient can progress to 2 sets of 10 reps with brief break in between sets, when  one set becomes too easy.    Balance Exercises - 09/29/17 1109      Balance Exercises: Standing   SLS  Eyes open;Foam/compliant surface;Intermittent upper extremity support;Other reps (comment) therapist min assist with step taps to balance disks    Rockerboard  Anterior/posterior;Head turns;EO;UE support Head nods, hip/ankle strategy, UE swing and trunk rotation    Balance Beam  Marching in place x 10, altnernating forward kicks x 10, then alternating forward step taps x 10; step taps forward/back x 10 reps with UE support    Sidestepping  Foam/compliant support;3 reps;Upper extremity support in parallel bars on balance beam    Marching Limitations  Marching forward on red compliant mat surface x 2 reps with cane    Heel Raises Limitations  x 20 reps 3 sec hold    Toe Raise  Limitations  x 20 reps 3 sec hold    Other Standing Exercises  With compliant surface activities, step tapping to disks-used mirror as visual cues, with VCs for widened BOS and therapist providing min assist          PT Short Term Goals - 09/11/17 1322      PT SHORT TERM GOAL #1   Title  Pt will be independent with HEP for improved balance, strength, and gait.  TARGET 09/08/17    Time  5    Period  Weeks    Status  Achieved      PT SHORT TERM GOAL #2   Title  Pt will perform at least 6 of 10 reps of sit<>stand with minimal to no UE support for improved transfer efficiency and safety    Time  5    Period  Weeks    Status  Achieved      PT SHORT TERM GOAL #3   Title  Pt will improve Berg Balance score to at least 34/56 for decreased fall risk.    Baseline  44/56 on 09/11/17    Time  5    Period  Weeks    Status  Achieved      PT SHORT TERM GOAL #4   Title  Pt will improve TUG score to less than or equal to 28 seconds for decreased fall risk.    Baseline  22.54 sec with cane, 20.66 sec with RW 09/11/17    Time  5    Period  Weeks    Status  Achieved      PT SHORT TERM GOAL #5   Title  Pt will verbalize understanding of CVA warning signs and symptoms.    Time  5    Period  Weeks    Status  Achieved        PT Long Term Goals - 09/11/17 1412      PT LONG TERM GOAL #1   Title  Pt/wife will verbalize understanding of fall prevention in the home environment.  TARGET 10/06/17    Time  9    Period  Weeks    Status  On-going    Target Date  10/06/17      PT LONG TERM GOAL #2   Title  Pt will improve gait velocity to at least 1.8 ft/sec for decreased fall risk/improved efficiency and safety with gait.    Time  9    Period  Weeks    Status  On-going    Target Date  10/06/17      PT LONG TERM GOAL #3   Title  Pt will improve  Berg Balance score to at least 42/56 for decreased fall risk.  Upgrading goal to [place] >49/56 for decreased fall risk    Time  9    Period  Weeks     Status  Revised    Target Date  10/06/17      PT LONG TERM GOAL #4   Title  Pt will improve TUG score to less than or equal to 20 seconds for decreased fall risk.  Upgrading goal to TUG < or equal to 15 seconds for decreased fall risk.    Time  9    Period  Weeks    Status  Revised    Target Date  10/06/17      PT LONG TERM GOAL #5   Title  Pt will ambulate at least 50-100 ft using cane, with supervision for improved independence with gait with lesser assistive device.  Upgrading goal to at least 500 ft, indoor and outdoor with cane, supervision.    Time  9    Period  Weeks    Status  Revised    Target Date  10/06/17            Plan - 09/29/17 1201    Clinical Impression Statement  Continued to work on strengthening and balance as well as gait training, including compliant mat surfaces today.  With compliant surfaces, pt needs either UE support or min assist/min guard from therapist for safety.  Pt will continue to benefit from skilled PT to address balance, strength and improved functional mobility.    Rehab Potential  Good    PT Frequency  -- 1x/wk for 1 week, then 2x wk for 8 weeks    PT Duration  -- POC= 9 weeks    PT Treatment/Interventions  ADLs/Self Care Home Management;Functional mobility training;Gait training;Therapeutic activities;DME Instruction;Therapeutic exercise;Balance training;Neuromuscular re-education;Patient/family education;Orthotic Fit/Training    PT Next Visit Plan  Work on  gait training with quad tip cane outdoors, direction changes; compliant surface work; try indoor gait with no device at some point Check LTGs next week and discuss renew/d/c (at end of 60 days 10/06/17)    Consulted and Agree with Plan of Care  Patient;Family member/caregiver    Family Member Consulted  wife       Patient will benefit from skilled therapeutic intervention in order to improve the following deficits and impairments:  Abnormal gait, Decreased balance, Decreased  mobility, Decreased strength, Difficulty walking  Visit Diagnosis: Muscle weakness (generalized)  Unsteadiness on feet  Other abnormalities of gait and mobility     Problem List Patient Active Problem List   Diagnosis Date Noted  . Chronic anticoagulation 09/04/2017  . Acute blood loss anemia   . Labile blood glucose   . PAF (paroxysmal atrial fibrillation) ([place])   . Hypoalbuminemia due to protein-calorie malnutrition ([place])   . Diabetes mellitus ([place])   . Benign essential HTN   . Ataxia, post-stroke   . 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 Dizziness 07/25/2017  . Hypothyroidism 09/13/2016  . BMI 26.53,adult 08/28/2015  . Medicare annual wellness visit, subsequent 08/28/2015  . 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 Vitamin D deficiency 04/29/2014  . Medication management 01/03/2014  . 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 BPH (benign prostatic hyperplasia)   . Testosterone deficiency   . 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 Anemia 11/07/2013  . Essential hypertension 11/07/2013  . Hyperlipidemia 11/07/2013  .  Paroxysmal atrial fibrillation ([place]) 09/18/2012    See Beharry W. 09/29/2017, 12:06 PM  Frazier Butt., PT   [facility] [address] [place], [place], [address] Phone: [phone]   Fax:  [phone]  Name: Earl [name] MRN: [id number] Date of Birth: May 27, 1941

## 2017-09-29 NOTE — Telephone Encounter (Signed)
Er Dr Melford Aase, the patient was advised to wait 6 month to have his colonoscopy, due to recent stroke.  A message was left to inform the patient.

## 2017-10-02 ENCOUNTER — Ambulatory Visit: Payer: Medicare Other | Admitting: Physical Therapy

## 2017-10-02 ENCOUNTER — Encounter: Payer: Self-pay | Admitting: Physical Therapy

## 2017-10-02 DIAGNOSIS — R2681 Unsteadiness on feet: Secondary | ICD-10-CM | POA: Diagnosis not present

## 2017-10-02 DIAGNOSIS — M6281 Muscle weakness (generalized): Secondary | ICD-10-CM

## 2017-10-02 DIAGNOSIS — R2689 Other abnormalities of gait and mobility: Secondary | ICD-10-CM

## 2017-10-02 NOTE — Therapy (Signed)
[facility] [address] [place], [place], [address] Phone: [phone]   Fax:  [phone]  Physical Therapy Treatment  Patient Details  Name: Earl [name] MRN: [id number] Date of Birth: 07-30-1941 Referring Provider: Dellie Catholic)   Encounter Date: 10/02/2017  PT End of Session - 10/02/17 1404    Visit Number  13    Number of Visits  18    Date for PT Re-Evaluation  10/09/17    Authorization Type  [employer]HC Medicare-GCODE and progress note due every 10th visit    PT Start Time  1017    PT Stop Time  1100    PT Time Calculation (min)  43 min    Equipment Utilized During Treatment  Gait belt    Activity Tolerance  Patient tolerated treatment well    Behavior During Therapy  WFL for tasks assessed/performed       Past Medical History:  Diagnosis Date  . BPH (benign prostatic hyperplasia)   . Diabetes mellitus ([place])   . Hyperlipidemia   . Hypertension   . Other testicular hypofunction   . Type II or unspecified type diabetes mellitus without mention of complication, not stated as uncontrolled     Past Surgical History:  Procedure Laterality Date  . [place] and 2004    There were no vitals filed for this visit.  Subjective Assessment - 10/02/17 1020    Subjective  Walked in with cane today.  Worked on exercises over the weekend.    Patient is accompained by:  Family member wife    Pertinent History  DM, PAF, HTN    Patient Stated Goals  Work on balance, walking and get back to independent    Currently in Pain?  No/denies                      [facility] Adult PT Treatment/Exercise - 10/02/17 1035      Ambulation/Gait   Ambulation/Gait  Yes    Ambulation/Gait Assistance  5: Supervision    Ambulation/Gait Assistance Details  Short distance gait and turns on red/blue complaint mat surface with cane with quad tip with postural cues to avoid looking at feet,  min guard/min assistance at times.    Ambulation Distance (Feet)  1000 Feet    Assistive device  Straight cane then no device x 230 ft    Gait Pattern  Step-through pattern;Decreased step length - right;Decreased step length - left;Wide base of support;Ataxic;Decreased weight shift to right ?Trendelenburg on RLE with no cane    Ambulation Surface  Level;Indoor    Pre-Gait Activities  With short distance gait without cane, cues for upright posture and relaxed arm swing.  Pt tends to "dip" on RLE with RLE as stance.    Gait Comments  Long distance gait indoors with cane, with conversation tasks and environmental scanning today.  Pt requires extra time on naming foot items A-Z while walking.  No LOB noted on level surface.  On compliant surface, pt has one episode of LOB to R side.          Balance Exercises - 10/02/17 1357      Balance Exercises: Standing   Rockerboard  Anterior/posterior;Lateral;EO;Head turns Head nods, hip/ankle strategy work, arm swing/trunk Dispensing optician in place x 10, altnernating forward kicks x 10, then alternating forward step taps x 10; step taps forward/back x 10 reps with  UE support.  Alternating back step taps x 10 reps, alternating side step taps x 10 reps    Gait with Head Turns  Forward;Foam/compliant surface;2 reps each head nods, then head turns; using cane, min guard assis    Sidestepping  Foam/compliant support;2 reps on red mat surface      On rockerboard-pt needs intermittent UE support and min guard/supervision    PT Short Term Goals - 09/11/17 1322      PT SHORT TERM GOAL #1   Title  Pt will be independent with HEP for improved balance, strength, and gait.  TARGET 09/08/17    Time  5    Period  Weeks    Status  Achieved      PT SHORT TERM GOAL #2   Title  Pt will perform at least 6 of 10 reps of sit<>stand with minimal to no UE support for improved transfer efficiency and safety    Time  5    Period  Weeks    Status   Achieved      PT SHORT TERM GOAL #3   Title  Pt will improve Berg Balance score to at least 34/56 for decreased fall risk.    Baseline  44/56 on 09/11/17    Time  5    Period  Weeks    Status  Achieved      PT SHORT TERM GOAL #4   Title  Pt will improve TUG score to less than or equal to 28 seconds for decreased fall risk.    Baseline  22.54 sec with cane, 20.66 sec with RW 09/11/17    Time  5    Period  Weeks    Status  Achieved      PT SHORT TERM GOAL #5   Title  Pt will verbalize understanding of CVA warning signs and symptoms.    Time  5    Period  Weeks    Status  Achieved        PT Long Term Goals - 09/11/17 1412      PT LONG TERM GOAL #1   Title  Pt/wife will verbalize understanding of fall prevention in the home environment.  TARGET 10/06/17    Time  9    Period  Weeks    Status  On-going    Target Date  10/06/17      PT LONG TERM GOAL #2   Title  Pt will improve gait velocity to at least 1.8 ft/sec for decreased fall risk/improved efficiency and safety with gait.    Time  9    Period  Weeks    Status  On-going    Target Date  10/06/17      PT LONG TERM GOAL #3   Title  Pt will improve Berg Balance score to at least 42/56 for decreased fall risk.  Upgrading goal to [place] >49/56 for decreased fall risk    Time  9    Period  Weeks    Status  Revised    Target Date  10/06/17      PT LONG TERM GOAL #4   Title  Pt will improve TUG score to less than or equal to 20 seconds for decreased fall risk.  Upgrading goal to TUG < or equal to 15 seconds for decreased fall risk.    Time  9    Period  Weeks    Status  Revised    Target Date  10/06/17  PT LONG TERM GOAL #5   Title  Pt will ambulate at least 50-100 ft using cane, with supervision for improved independence with gait with lesser assistive device.  Upgrading goal to at least 500 ft, indoor and outdoor with cane, supervision.    Time  9    Period  Weeks    Status  Revised    Target Date  10/06/17             Plan - 10/02/17 1404    Clinical Impression Statement  Continued to work on balance and gait on compliant surfaces and then short distance without cane.  With cane on compliant surfaces with head nods/turns, pt has LOB to R side-able to regain with therapist min assist.  Short distance gait with no cane, with increased Trendelenburg gait pattern noted on RLE, with plans to target RLE strengthening next visit.  Unable to walk outdoors again today due to rain.    Rehab Potential  Good    PT Frequency  -- 1x/wk for 1 week, then 2x wk for 8 weeks    PT Duration  -- POC= 9 weeks    PT Treatment/Interventions  ADLs/Self Care Home Management;Functional mobility training;Gait training;Therapeutic activities;DME Instruction;Therapeutic exercise;Balance training;Neuromuscular re-education;Patient/family education;Orthotic Fit/Training    PT Next Visit Plan  gait training with cane on long distance, outdoor surfaces; gait short distance indoors no device; functional RLE strengthening; compliant surface work-check LTGs and likely renew (vs d/c), as his 60 days is 10/06/17 Check LTGs next week and discuss renew/d/c (at end of 60 days 10/06/17)    Consulted and Agree with Plan of Care  Patient       Patient will benefit from skilled therapeutic intervention in order to improve the following deficits and impairments:  Abnormal gait, Decreased balance, Decreased mobility, Decreased strength, Difficulty walking  Visit Diagnosis: Unsteadiness on feet  Muscle weakness (generalized)  Other abnormalities of gait and mobility     Problem List Patient Active Problem List   Diagnosis Date Noted  . Chronic anticoagulation 09/04/2017  . Acute blood loss anemia   . Labile blood glucose   . PAF (paroxysmal atrial fibrillation) ([place])   . Hypoalbuminemia due to protein-calorie malnutrition ([place])   . Diabetes mellitus ([place])   . Benign essential HTN   . Ataxia, post-stroke   . Cerebrovascular  accident (CVA) due to embolism of right cerebellar artery ([place])   . Dizziness 07/25/2017  . Hypothyroidism 09/13/2016  . BMI 26.53,adult 08/28/2015  . Medicare annual wellness visit, subsequent 08/28/2015  . 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 Vitamin D deficiency 04/29/2014  . Medication management 01/03/2014  . 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 BPH (benign prostatic hyperplasia)   . Testosterone deficiency   . 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 Anemia 11/07/2013  . Essential hypertension 11/07/2013  . Hyperlipidemia 11/07/2013  . Paroxysmal atrial fibrillation ([place]) 09/18/2012    Shailene Demonbreun W. 10/02/2017, 2:08 PM  Frazier Butt., PT  [facility] [address] [place], [place], [address] Phone: [phone]   Fax:  [phone]  Name: Earl [name] MRN: [id number] Date of Birth: Jan 19, 1941

## 2017-10-04 ENCOUNTER — Telehealth: Payer: Self-pay | Admitting: Cardiology

## 2017-10-04 NOTE — Telephone Encounter (Signed)
Spoke w/ pt and requested that he send a manual transmission b/c his home monitor has not updated in at least 14 days.   

## 2017-10-05 ENCOUNTER — Encounter: Payer: Self-pay | Admitting: Physical Therapy

## 2017-10-05 ENCOUNTER — Ambulatory Visit: Payer: Medicare Other | Admitting: Physical Therapy

## 2017-10-05 DIAGNOSIS — R2681 Unsteadiness on feet: Secondary | ICD-10-CM

## 2017-10-05 DIAGNOSIS — M6281 Muscle weakness (generalized): Secondary | ICD-10-CM | POA: Diagnosis not present

## 2017-10-05 DIAGNOSIS — R2689 Other abnormalities of gait and mobility: Secondary | ICD-10-CM

## 2017-10-05 NOTE — Therapy (Signed)
[facility] [address] [place], [place], [address] Phone: [phone]   Fax:  [phone]  Physical Therapy Treatment  Patient Details  Name: Earl [name] MRN: [id number] Date of Birth: [birth year] Referring Provider: Dellie Catholic)   Encounter Date: 10/05/2017  PT End of Session - 10/05/17 1201    Visit Number  14    Number of Visits  22    Date for PT Re-Evaluation  11/03/17    Authorization Type  [employer] Medicare-GCODE and progress note due every 10th visit    PT Start Time  1104    PT Stop Time  1147    PT Time Calculation (min)  43 min    Equipment Utilized During Treatment  Gait belt    Activity Tolerance  Patient tolerated treatment well    Behavior During Therapy  [facility] for tasks assessed/performed       Past Medical History:  Diagnosis Date  . BPH (benign prostatic hyperplasia)   . Diabetes mellitus ([place])   . Hyperlipidemia   . Hypertension   . Other testicular hypofunction   . Type II or unspecified type diabetes mellitus without mention of complication, not stated as uncontrolled     Past Surgical History:  Procedure Laterality Date  . [place] and 2004  . LOOP RECORDER INSERTION N/A 07/28/2017   Procedure: LOOP RECORDER INSERTION;  Surgeon: Deboraha Sprang, MD;  Location: [facility];  Service: Cardiovascular;  Laterality: N/A;  . TEE WITHOUT CARDIOVERSION N/A 07/28/2017   Procedure: TRANSESOPHAGEAL ECHOCARDIOGRAM (TEE);  Surgeon: Sueanne Margarita, MD;  Location: [facility] ENDOSCOPY;  Service: Cardiovascular;  Laterality: N/A;    There were no vitals filed for this visit.  Subjective Assessment - 10/05/17 1105    Subjective  No changes since last visit.    Patient is accompained by:  Family member wife    Pertinent History  DM, PAF, HTN    Patient Stated Goals  Work on balance, walking and get back to independent    Currently in Pain?  No/denies          [facility] PT Assessment - 10/05/17 0001      Berg Balance Test   Sit to Stand  Able to stand without using hands and stabilize independently    Standing Unsupported  Able to stand safely 2 minutes    Sitting with Back Unsupported but Feet Supported on Floor or Stool  Able to sit safely and securely 2 minutes    Stand to Sit  Sits safely with minimal use of hands    Transfers  Able to transfer safely, minor use of hands    Standing Unsupported with Eyes Closed  Able to stand 10 seconds safely    Standing Ubsupported with Feet Together  Able to place feet together independently and stand 1 minute safely    From Standing, Reach Forward with Outstretched Arm  Can reach confidently >25 cm (10")    From Standing Position, Pick up Object from Floor  Able to pick up shoe safely and easily    From Standing Position, Turn to Look Behind Over each Shoulder  Looks behind from both sides and weight shifts well    Turn 360 Degrees  Able to turn 360 degrees safely but slowly    Standing Unsupported, Alternately Place Feet on Step/Stool  Able to stand independently and safely and complete 8 steps in 20 seconds  Standing Unsupported, One Foot in Front  Able to plae foot ahead of the other independently and hold 30 seconds    Standing on One Leg  Tries to lift leg/unable to hold 3 seconds but remains standing independently    Total Score  50                  [facility] Adult PT Treatment/Exercise - 10/05/17 0001      Ambulation/Gait   Ambulation/Gait  Yes    Ambulation/Gait Assistance  5: Supervision    Ambulation/Gait Assistance Details  Requires close supervision on unlevel surfaces, with no device; otherwise, distance supervision no device    Ambulation Distance (Feet)  500 Feet    Assistive device  None Gait into and out of PT session with cane    Gait Pattern  Step-through pattern;Decreased step length - right;Decreased step length - left;Wide base of support;Ataxic;Decreased weight shift  to right    Ambulation Surface  Level;Indoor;Unlevel Simulated outdoor surfaces due to rain      Dynamic Gait Index   Level Surface  Mild Impairment    Change in Gait Speed  Mild Impairment    Gait with Horizontal Head Turns  Mild Impairment    Gait with Vertical Head Turns  Mild Impairment    Gait and Pivot Turn  Mild Impairment    Step Over Obstacle  Mild Impairment    Step Around Obstacles  Mild Impairment    Steps  Mild Impairment    Total Score  16      Timed Up and Go Test   TUG  Normal TUG    Normal TUG (seconds)  15.35 cane; 14.31 no cane      Self-Care   Self-Care  Other Self-Care Comments    Other Self-Care Comments   Discusse POC, progress towards goals, and pt's goals to continue to be independent with gait.  Pt in agreement with continueing for at least one additional week of PT.            Self Care: Discussed and provided fall prevention education.  PT Education - 10/05/17 1200    Education provided  Yes    Education Details  Progress with goals; POC; instructed to continue to use cane outdoor surfaces, okay to ambulate short distances in home with no device    Person(s) Educated  Patient    Methods  Explanation;Demonstration    Comprehension  Verbalized understanding;Returned demonstration       PT Short Term Goals - 09/11/17 1322      PT SHORT TERM GOAL #1   Title  Pt will be independent with HEP for improved balance, strength, and gait.  TARGET 09/08/17    Time  5    Period  Weeks    Status  Achieved      PT SHORT TERM GOAL #2   Title  Pt will perform at least 6 of 10 reps of sit<>stand with minimal to no UE support for improved transfer efficiency and safety    Time  5    Period  Weeks    Status  Achieved      PT SHORT TERM GOAL #3   Title  Pt will improve Berg Balance score to at least 34/56 for decreased fall risk.    Baseline  44/56 on 09/11/17    Time  5    Period  Weeks    Status  Achieved      PT SHORT TERM GOAL #4  Title  Pt  will improve TUG score to less than or equal to 28 seconds for decreased fall risk.    Baseline  22.54 sec with cane, 20.66 sec with RW 09/11/17    Time  5    Period  Weeks    Status  Achieved      PT SHORT TERM GOAL #5   Title  Pt will verbalize understanding of CVA warning signs and symptoms.    Time  5    Period  Weeks    Status  Achieved        PT Long Term Goals - 10/05/17 1106      PT LONG TERM GOAL #1   Title  Pt/wife will verbalize understanding of fall prevention in the home environment.  TARGET 10/06/17    Time  9    Period  Weeks    Status  Achieved      PT LONG TERM GOAL #2   Title  Pt will improve gait velocity to at least 1.8 ft/sec for decreased fall risk/improved efficiency and safety with gait.    Baseline  2.66 ft/sec with cane; 2.85 ft/sec    Time  9    Period  Weeks    Status  Achieved      PT LONG TERM GOAL #3   Title  Pt will improve Berg Balance score to at least 42/56 for decreased fall risk.  Upgrading goal to [employer] for decreased fall risk    Time  9    Period  Weeks    Status  Achieved      PT LONG TERM GOAL #4   Title  Pt will improve TUG score to less than or equal to 20 seconds for decreased fall risk.  Upgrading goal to TUG < or equal to 15 seconds for decreased fall risk.    Baseline  TUG 15.35 cane, TUG 14.31 sec     Time  9    Period  Weeks    Status  Achieved      PT LONG TERM GOAL #5   Title  Pt will ambulate at least 50-100 ft using cane, with supervision for improved independence with gait with lesser assistive device.  Upgrading goal to at least 500 ft, indoor and outdoor with cane, supervision.    Time  9    Period  Weeks    Status  Achieved      Additional Long Term Goals   Additional Long Term Goals  Yes      PT LONG TERM GOAL #6   Title  Pt will improve TUG score to less than or equal to 13.5 seconds for decreased fall risk.    Time  4 per recert 35/68/61    Period  Weeks    Status  New    Target Date  11/03/17       PT LONG TERM GOAL #7   Title  Pt will improve DGI to at least 19/24 for decreased fall risk.    Time  4 per recert 68/37/29    Period  Weeks    Status  New    Target Date  11/03/17      PT LONG TERM GOAL #8   Title  Pt will be independent with updated HEP.    Time  4 per recert 01/01/14    Period  Weeks    Status  New    Target Date  11/03/17  Plan - 10/05/17 1204    Clinical Impression Statement  Pt has met LTG #1-5, with improvements noted in functional mobility, with most of session today performed without cane.  Plan to recert PT this visit, with LTG #6-8 new goals to be addressed.  Pt is at fall risk still per TUG and DGI, but he has improved on both scores.  No LOB noted indoor surfaces with distant supervision with no device today.  Pt will continue to benefit from skilled PT to address balance, strength and functional mobility.    Rehab Potential  Good    PT Frequency  2x / week    PT Duration  4 weeks    PT Treatment/Interventions  ADLs/Self Care Home Management;Functional mobility training;Gait training;Therapeutic activities;DME Instruction;Therapeutic exercise;Balance training;Neuromuscular re-education;Patient/family education;Orthotic Fit/Training    PT Next Visit Plan  Review and update HEP; work on gait without cane, functional RLE strengthening    Consulted and Agree with Plan of Care  Patient       Patient will benefit from skilled therapeutic intervention in order to improve the following deficits and impairments:  Abnormal gait, Decreased balance, Decreased mobility, Decreased strength, Difficulty walking  Visit Diagnosis: Unsteadiness on feet  Other abnormalities of gait and mobility  Muscle weakness (generalized)     Problem List Patient Active Problem List   Diagnosis Date Noted  . Chronic anticoagulation 09/04/2017  . Acute blood loss anemia   . Labile blood glucose   . PAF (paroxysmal atrial fibrillation) ([place])   .  Hypoalbuminemia due to protein-calorie malnutrition ([place])   . Diabetes mellitus ([place])   . Benign essential HTN   . Ataxia, post-stroke   . 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 Dizziness 07/25/2017  . Hypothyroidism 09/13/2016  . BMI 26.53,adult 08/28/2015  . Medicare annual wellness visit, subsequent 08/28/2015  . 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 Vitamin D deficiency 04/29/2014  . Medication management 01/03/2014  . 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 BPH (benign prostatic hyperplasia)   . Testosterone deficiency   . 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 Anemia 11/07/2013  . Essential hypertension 11/07/2013  . Hyperlipidemia 11/07/2013  . Paroxysmal atrial fibrillation ([place]) 09/18/2012    Amybeth Sieg W. 10/05/2017, 12:15 PM Frazier Butt., PT  [facility] [address] [place], [place], [address] Phone: [phone]   Fax:  [phone]  Name: Earl [name] MRN: [id number] Date of Birth: 03-16-1941

## 2017-10-06 ENCOUNTER — Encounter: Payer: Self-pay | Admitting: Physical Medicine & Rehabilitation

## 2017-10-06 ENCOUNTER — Encounter: Payer: Medicare Other | Attending: Physical Medicine & Rehabilitation | Admitting: Physical Medicine & Rehabilitation

## 2017-10-06 ENCOUNTER — Other Ambulatory Visit: Payer: Self-pay

## 2017-10-06 VITALS — BP 147/80 | HR 96

## 2017-10-06 DIAGNOSIS — R269 Unspecified abnormalities of gait and mobility: Secondary | ICD-10-CM

## 2017-10-06 DIAGNOSIS — E785 Hyperlipidemia, unspecified: Secondary | ICD-10-CM | POA: Insufficient documentation

## 2017-10-06 DIAGNOSIS — I639 Cerebral infarction, unspecified: Secondary | ICD-10-CM | POA: Diagnosis not present

## 2017-10-06 DIAGNOSIS — Z8249 Family history of ischemic heart disease and other diseases of the circulatory system: Secondary | ICD-10-CM | POA: Insufficient documentation

## 2017-10-06 DIAGNOSIS — E1159 Type 2 diabetes mellitus with other circulatory complications: Secondary | ICD-10-CM | POA: Diagnosis not present

## 2017-10-06 DIAGNOSIS — E1151 Type 2 diabetes mellitus with diabetic peripheral angiopathy without gangrene: Secondary | ICD-10-CM | POA: Insufficient documentation

## 2017-10-06 DIAGNOSIS — I10 Essential (primary) hypertension: Secondary | ICD-10-CM | POA: Insufficient documentation

## 2017-10-06 DIAGNOSIS — I6930 Unspecified sequelae of cerebral infarction: Secondary | ICD-10-CM

## 2017-10-06 DIAGNOSIS — I480 Paroxysmal atrial fibrillation: Secondary | ICD-10-CM

## 2017-10-06 NOTE — Progress Notes (Signed)
Subjective:    Patient ID: Earl [name], male    DOB: 12/11/1940, 76 y.o.   MRN: [id number]  HPI 76 y.o. male with history of  HTN, questionable history PAF, T2DM presents for follow up for cerebellar stroke.   Last clinic visit 08/25/17.  Since that time, pt states he is still in therapies.  He saw Neurology. BP is relatively controlled. His CBGs are controlled. Denies falls.   Pain Inventory Average Pain 0 Pain Right Now 0 My pain is no pain  In the last 24 hours, has pain interfered with the following? General activity 0 Relation with others 0 Enjoyment of life 0 What TIME of day is your pain at its worst? no pain Sleep (in general) no pain  Pain is worse with: no pain Pain improves with: no pain Relief from Meds: no pain  Mobility walk with assistance use a cane ability to climb steps?  yes do you drive?  no Do you have any goals in this area?  no  Function retired I need assistance with the following:  . Do you have any goals in this area?  no  Neuro/Psych trouble walking  Prior Studies Any changes since last visit?  no hospital f/u  Physicians involved in your care Any changes since last visit?  no hospital f/u   Family History  Problem Relation Age of Onset  . Sickle cell anemia Mother   . Diabetes Father   . Stomach cancer Father   . Colon cancer Neg Hx   . CAD Neg Hx    Social History   Socioeconomic History  . Marital status: Married    Spouse name: None  . Number of children: 2  . Years of education: None  . Highest education level: None  Social Needs  . Financial resource strain: None  . Food insecurity - worry: None  . Food insecurity - inability: None  . Transportation needs - medical: None  . Transportation needs - non-medical: None  Occupational History  . Occupation: Herbalist: RETIRED  Tobacco Use  . Smoking status: Never Smoker  . Smokeless tobacco: Never Used  Substance and  Sexual Activity  . Alcohol use: No  . Drug use: No  . Sexual activity: None  Other Topics Concern  . None  Social History Narrative  . None   Past Surgical History:  Procedure Laterality Date  . [place] and 2004  . LOOP RECORDER INSERTION N/A 07/28/2017   Performed by Deboraha Sprang, MD at [facility]  . TRANSESOPHAGEAL ECHOCARDIOGRAM (TEE) N/A 07/28/2017   Performed by Sueanne Margarita, MD at [facility] ENDOSCOPY   Past Medical History:  Diagnosis Date  . BPH (benign prostatic hyperplasia)   . Diabetes mellitus ([place])   . Hyperlipidemia   . Hypertension   . Other testicular hypofunction   . Type II or unspecified type diabetes mellitus without mention of complication, not stated as uncontrolled    BP (!) 147/80   Pulse 96   SpO2 98%   Opioid Risk Score:   Fall Risk Score:  `1  Depression screen PHQ 2/9  Depression screen [facility] 2/9 10/06/2017 09/18/2017 08/25/2017 04/15/2017 12/23/2016 03/01/2016 11/30/2015  Decreased Interest 0 0 0 0 0 0 0  Down, Depressed, Hopeless 0 0 0 0 0 0 0  PHQ - 2 Score 0 0 0 0 0 0 0  Altered sleeping - - 0 - - - -  Tired, decreased energy - - 1 - - - -  Change in appetite - - 0 - - - -  Feeling bad or failure about yourself  - - 0 - - - -  Trouble concentrating - - 0 - - - -  Moving slowly or fidgety/restless - - 0 - - - -  Suicidal thoughts - - 0 - - - -  PHQ-9 Score - - 1 - - - -  Difficult doing work/chores - - Not difficult at all - - - -    Review of Systems  Constitutional: Negative.   HENT: Negative.   Eyes: Negative.   Respiratory: Negative.   Cardiovascular: Negative.   Gastrointestinal: Negative.   Endocrine: Negative.   Genitourinary: Negative.   Musculoskeletal: Positive for gait problem.  Skin: Negative.   Allergic/Immunologic: Negative.   Neurological: Positive for dizziness.  Hematological: Negative.   Psychiatric/Behavioral: Negative.   All other systems reviewed and are negative.     Objective:     Physical Exam Constitutional: He appears well-developed and well-nourished. No distress.  HENT: Normocephalic and atraumatic.  Eyes: EOM are normal. No discharge.  Cardiovascular: RRR. No JVD. Respiratory: Effort normal and breath sounds normal.  GI: Bowel sounds are normal. He exhibits no distension.  Musculoskeletal: Slow cadence. Mild edema b/l. No tenderness.  Neurological: He is alert and oriented.  Speech clear.  No ataxia b/l UE Motor: RUE/RLE grossly 5/5 prox to distal. LUE/LLE 5/5.  Skin: Skin is warm and dry. He is not diaphoretic.  Psychiatric: He has a normal mood and affect. His behavior is normal. Judgment and thought content normal.     Assessment & Plan:  76 y.o. male with history of  HTN, questionable history PAF, T2DM presents for follow up for cerebellar stroke.   1.  Functional and mobility deficits secondary to right cerebellar infarct  Cont therapies  Cont follow up with Neurology  2. HTN  Relatively controlled  Pt to follow up with PCP for adjustments  3. T2DM  Cont meds  4. PAF  Cont Eliquis  5. Gait abnormality  Cont therpaies  Cont cane for safety

## 2017-10-09 ENCOUNTER — Encounter: Payer: Self-pay | Admitting: Physical Therapy

## 2017-10-09 ENCOUNTER — Ambulatory Visit: Payer: Medicare Other | Admitting: Physical Therapy

## 2017-10-09 DIAGNOSIS — M6281 Muscle weakness (generalized): Secondary | ICD-10-CM | POA: Diagnosis not present

## 2017-10-09 DIAGNOSIS — R2681 Unsteadiness on feet: Secondary | ICD-10-CM | POA: Diagnosis not present

## 2017-10-09 DIAGNOSIS — R2689 Other abnormalities of gait and mobility: Secondary | ICD-10-CM | POA: Diagnosis not present

## 2017-10-09 NOTE — Patient Instructions (Addendum)
Toe / Heel Raise    Stand at the counterGently rock back on heels and raise toes (hold 3 seconds). Then rock forward on toes and raise heels.(Hold 3 seconds).  Repeat sequence _2 sets of 10___ times per session  Copyright  VHI. All rights reserved.  Lower Body: Toe Rise    Standing on your right leg only. Use support at counter. Rise up on toes. Hold _3___ seconds, then lower. Repeat immediately. Repeat __2 sets of 10__ times.   http://gt2.exer.us/894   Copyright  VHI. All rights reserved.    Continue all previous HEP given, with exception of discharging marching in place, back kicks, side kicks at counter (as these have been replaced by resisted hip kicks)

## 2017-10-09 NOTE — Therapy (Signed)
[facility] [address] [place], [place], [address] Phone: [phone]   Fax:  [phone]  Physical Therapy Treatment  Patient Details  Name: Earl [name] MRN: [id number] Date of Birth: [birth year] Referring Provider: Dellie Catholic)   Encounter Date: 10/09/2017  PT End of Session - 10/09/17 1212    Visit Number  15    Number of Visits  22    Date for PT Re-Evaluation  11/03/17    Authorization Type  [employer] Medicare-GCODE and progress note due every 10th visit    PT Start Time  1102    PT Stop Time  1144    PT Time Calculation (min)  42 min    Activity Tolerance  Patient tolerated treatment well    Behavior During Therapy  [facility] for tasks assessed/performed       Past Medical History:  Diagnosis Date  . BPH (benign prostatic hyperplasia)   . Diabetes mellitus ([place])   . Hyperlipidemia   . Hypertension   . Other testicular hypofunction   . Type II or unspecified type diabetes mellitus without mention of complication, not stated as uncontrolled     Past Surgical History:  Procedure Laterality Date  . [place] and 2004  . LOOP RECORDER INSERTION N/A 07/28/2017   Performed by Deboraha Sprang, MD at [facility]  . TRANSESOPHAGEAL ECHOCARDIOGRAM (TEE) N/A 07/28/2017   Performed by Sueanne Margarita, MD at [facility] ENDOSCOPY    There were no vitals filed for this visit.  Subjective Assessment - 10/09/17 1104    Subjective  Doctor's visit went well with Dr. Posey Pronto last week.    Patient is accompained by:  Family member wife    Pertinent History  DM, PAF, HTN    Patient Stated Goals  Work on balance, walking and get back to independent    Currently in Pain?  No/denies                      [facility] Adult PT Treatment/Exercise - 10/09/17 0001      Exercises   Exercises  Ankle      Knee/Hip Exercises: Aerobic   Stepper  Seated SciFit Stepper, Level 1.5, 4  extremities x 5 minutes (RPM 70-90)      Ankle Exercises: Standing   Heel Raises  10 reps RLE only     Other Standing Ankle Exercises  Heel/toe raises, bilateral x 10 reps       Full review of HEP:  Pt performs tandem stance at counter, sit<>stand x 10 reps, seated hamstring stretch, standing corner exercises for balance (solid and compliant surface varied foot positions EO and EC with head turns/nods/diagonals).  Pt verbalizes understanding of resisted hip abduction, extension, and flexion (no green theraband available during session to use for review).  Pt verbalizes understanding of gastroc stretch.  Also performed bridging x 10 reps, bridging marching x 5 reps (cues needed for technique of bridge position marching only), with pt otherwise return demo understanding of full HEP.  Instructed patient to remove the very first exercises given (marching and A/ROM hip extension, abduction).       PT Education - 10/09/17 1210    Education provided  Yes    Education Details  Full review of HEP; additions to HEP-standing ankle strengthening    Person(s) Educated  Patient    Methods  Explanation;Demonstration    Comprehension  Verbalized understanding;Returned  demonstration       PT Short Term Goals - 09/11/17 1322      PT SHORT TERM GOAL #1   Title  Pt will be independent with HEP for improved balance, strength, and gait.  TARGET 09/08/17    Time  5    Period  Weeks    Status  Achieved      PT SHORT TERM GOAL #2   Title  Pt will perform at least 6 of 10 reps of sit<>stand with minimal to no UE support for improved transfer efficiency and safety    Time  5    Period  Weeks    Status  Achieved      PT SHORT TERM GOAL #3   Title  Pt will improve Berg Balance score to at least 34/56 for decreased fall risk.    Baseline  44/56 on 09/11/17    Time  5    Period  Weeks    Status  Achieved      PT SHORT TERM GOAL #4   Title  Pt will improve TUG score to less than or equal to 28 seconds  for decreased fall risk.    Baseline  22.54 sec with cane, 20.66 sec with RW 09/11/17    Time  5    Period  Weeks    Status  Achieved      PT SHORT TERM GOAL #5   Title  Pt will verbalize understanding of CVA warning signs and symptoms.    Time  5    Period  Weeks    Status  Achieved        PT Long Term Goals - 10/05/17 1106      PT LONG TERM GOAL #1   Title  Pt/wife will verbalize understanding of fall prevention in the home environment.  TARGET 10/06/17    Time  9    Period  Weeks    Status  Achieved      PT LONG TERM GOAL #2   Title  Pt will improve gait velocity to at least 1.8 ft/sec for decreased fall risk/improved efficiency and safety with gait.    Baseline  2.66 ft/sec with cane; 2.85 ft/sec    Time  9    Period  Weeks    Status  Achieved      PT LONG TERM GOAL #3   Title  Pt will improve Berg Balance score to at least 42/56 for decreased fall risk.  Upgrading goal to [employer] for decreased fall risk    Time  9    Period  Weeks    Status  Achieved      PT LONG TERM GOAL #4   Title  Pt will improve TUG score to less than or equal to 20 seconds for decreased fall risk.  Upgrading goal to TUG < or equal to 15 seconds for decreased fall risk.    Baseline  TUG 15.35 cane, TUG 14.31 sec     Time  9    Period  Weeks    Status  Achieved      PT LONG TERM GOAL #5   Title  Pt will ambulate at least 50-100 ft using cane, with supervision for improved independence with gait with lesser assistive device.  Upgrading goal to at least 500 ft, indoor and outdoor with cane, supervision.    Time  9    Period  Weeks    Status  Achieved  Additional Long Term Goals   Additional Long Term Goals  Yes      PT LONG TERM GOAL #6   Title  Pt will improve TUG score to less than or equal to 13.5 seconds for decreased fall risk.    Time  4 per recert 24/09/73    Period  Weeks    Status  New    Target Date  11/03/17      PT LONG TERM GOAL #7   Title  Pt will improve DGI to  at least 19/24 for decreased fall risk.    Time  4 per recert 53/29/92    Period  Weeks    Status  New    Target Date  11/03/17      PT LONG TERM GOAL #8   Title  Pt will be independent with updated HEP.    Time  4 per recert 42/68/34    Period  Weeks    Status  New    Target Date  11/03/17            Plan - 10/09/17 1212    Clinical Impression Statement  Full review of HEP today, with modifications made.  Pt return demo understanding of HEP, but admits not doing all exercises at home.  Discussed how to implement aerobic exercise into his routine, as he has recumbent bike at home.  Pt strongly prefers to d/c from PT at next visit, as he feels comfortable with his progress.    Rehab Potential  Good    PT Frequency  2x / week    PT Duration  4 weeks    PT Treatment/Interventions  ADLs/Self Care Home Management;Functional mobility training;Gait training;Therapeutic activities;DME Instruction;Therapeutic exercise;Balance training;Neuromuscular re-education;Patient/family education;Orthotic Fit/Training    PT Next Visit Plan  Review and update HEP; work on gait without cane, functional RLE strengthening    Consulted and Agree with Plan of Care  Patient       Patient will benefit from skilled therapeutic intervention in order to improve the following deficits and impairments:  Abnormal gait, Decreased balance, Decreased mobility, Decreased strength, Difficulty walking  Visit Diagnosis: Muscle weakness (generalized)     Problem List Patient Active Problem List   Diagnosis Date Noted  . Chronic anticoagulation 09/04/2017  . Acute blood loss anemia   . Labile blood glucose   . PAF (paroxysmal atrial fibrillation) ([place])   . Hypoalbuminemia due to protein-calorie malnutrition ([place])   . Diabetes mellitus ([place])   . Benign essential HTN   . Ataxia, post-stroke   . 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 Dizziness 07/25/2017  .  Hypothyroidism 09/13/2016  . BMI 26.53,adult 08/28/2015  . Medicare annual wellness visit, subsequent 08/28/2015  . 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 Vitamin D deficiency 04/29/2014  . Medication management 01/03/2014  . 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 BPH (benign prostatic hyperplasia)   . Testosterone deficiency   . 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 Anemia 11/07/2013  . Essential hypertension 11/07/2013  . Hyperlipidemia 11/07/2013  . Paroxysmal atrial fibrillation ([place]) 09/18/2012    Maye Parkinson W. 10/09/2017, 12:14 PM  Frazier Butt., PT   [facility] [address] [place], [place], [address] Phone: [phone]   Fax:  [phone]  Name: Earl [name] MRN: [id number] Date of Birth: 1941-01-27

## 2017-10-10 ENCOUNTER — Ambulatory Visit: Payer: Medicare Other | Admitting: Physical Therapy

## 2017-10-10 ENCOUNTER — Encounter: Payer: Self-pay | Admitting: Physical Therapy

## 2017-10-10 DIAGNOSIS — R2681 Unsteadiness on feet: Secondary | ICD-10-CM

## 2017-10-10 DIAGNOSIS — M6281 Muscle weakness (generalized): Secondary | ICD-10-CM | POA: Diagnosis not present

## 2017-10-10 DIAGNOSIS — R2689 Other abnormalities of gait and mobility: Secondary | ICD-10-CM

## 2017-10-10 NOTE — Therapy (Signed)
[facility] [address] [place], [place], [address] Phone: [phone]   Fax:  [phone]  Physical Therapy Treatment  Patient Details  Name: Earl [name] MRN: [id number] Date of Birth: 09-05-1941 Referring Provider: Dellie Catholic)   Encounter Date: 10/10/2017  PT End of Session - 10/10/17 1206    Visit Number  16    Number of Visits  22    Date for PT Re-Evaluation  11/03/17    Authorization Type  [employer]HC Medicare-GCODE and progress note due every 10th visit    PT Start Time  1021    PT Stop Time  1101    PT Time Calculation (min)  40 min    Activity Tolerance  Patient tolerated treatment well    Behavior During Therapy  [facility] for tasks assessed/performed       Past Medical History:  Diagnosis Date  . BPH (benign prostatic hyperplasia)   . Diabetes mellitus ([place])   . Hyperlipidemia   . Hypertension   . Other testicular hypofunction   . Type II or unspecified type diabetes mellitus without mention of complication, not stated as uncontrolled     Past Surgical History:  Procedure Laterality Date  . [place] and 2004  . LOOP RECORDER INSERTION N/A 07/28/2017   Procedure: LOOP RECORDER INSERTION;  Surgeon: Deboraha Sprang, MD;  Location: [facility];  Service: Cardiovascular;  Laterality: N/A;  . TEE WITHOUT CARDIOVERSION N/A 07/28/2017   Procedure: TRANSESOPHAGEAL ECHOCARDIOGRAM (TEE);  Surgeon: Sueanne Margarita, MD;  Location: [facility] ENDOSCOPY;  Service: Cardiovascular;  Laterality: N/A;    There were no vitals filed for this visit.  Subjective Assessment - 10/10/17 1024    Subjective  Have a plan to make sure to keep doing my exercises, starting after Thanksgiving.    Patient is accompained by:  Family member wife    Pertinent History  DM, PAF, HTN    Patient Stated Goals  Work on balance, walking and get back to independent    Currently in Pain?  No/denies                       [facility] Adult PT Treatment/Exercise - 10/10/17 1109      Ambulation/Gait   Ambulation/Gait  Yes    Ambulation/Gait Assistance  6: Modified independent (Device/Increase time)    Ambulation Distance (Feet)  300 Feet indoors no cane; 800 ft outdoors cane    Assistive device  None;Straight cane    Gait Pattern  Step-through pattern;Decreased step length - right;Decreased step length - left;Wide base of support;Ataxic;Decreased weight shift to right    Ambulation Surface  Level;Indoor;Unlevel;Outdoor    Stairs  Yes    Stairs Assistance  5: Supervision    Stair Management Technique  No rails;Alternating pattern    Number of Stairs  4 x2      Dynamic Gait Index   Level Surface  Mild Impairment    Change in Gait Speed  Normal    Gait with Horizontal Head Turns  Mild Impairment    Gait with Vertical Head Turns  Mild Impairment    Gait and Pivot Turn  Normal    Step Over Obstacle  Mild Impairment    Step Around Obstacles  Mild Impairment    Steps  Normal    Total Score  19      Timed Up and Go Test   TUG  Normal TUG    Normal TUG (seconds)  12.59      Self-Care   Self-Care  Other Self-Care Comments    Other Self-Care Comments   Discussed progress towards goals, plans for discharge this visit; PT assisted patient to complete FOTO discharge questionnaire and discussed FOTO results.  (d/c status 71; Neuro QOL lower extremity 50.3)      Exercises   Exercises  Ankle      Ankle Exercises: Standing   Heel Raises  10 reps RLE only-reviewed HEP-pt return demo understanding    Other Standing Ankle Exercises  Reviewed heel/toe raises x 10 reps-return demo understanding             PT Education - 10/10/17 1205    Education provided  Yes    Education Details  progress towards goals, plans for d/c this visit; FOTO completed and discussed FOTO results    Person(s) Educated  Patient    Methods  Explanation;Demonstration;Handout pt requests handout of FOTO  scores    Comprehension  Verbalized understanding       PT Short Term Goals - 09/11/17 1322      PT SHORT TERM GOAL #1   Title  Pt will be independent with HEP for improved balance, strength, and gait.  TARGET 09/08/17    Time  5    Period  Weeks    Status  Achieved      PT SHORT TERM GOAL #2   Title  Pt will perform at least 6 of 10 reps of sit<>stand with minimal to no UE support for improved transfer efficiency and safety    Time  5    Period  Weeks    Status  Achieved      PT SHORT TERM GOAL #3   Title  Pt will improve Berg Balance score to at least 34/56 for decreased fall risk.    Baseline  44/56 on 09/11/17    Time  5    Period  Weeks    Status  Achieved      PT SHORT TERM GOAL #4   Title  Pt will improve TUG score to less than or equal to 28 seconds for decreased fall risk.    Baseline  22.54 sec with cane, 20.66 sec with RW 09/11/17    Time  5    Period  Weeks    Status  Achieved      PT SHORT TERM GOAL #5   Title  Pt will verbalize understanding of CVA warning signs and symptoms.    Time  5    Period  Weeks    Status  Achieved        PT Long Term Goals - 10/10/17 1024      PT LONG TERM GOAL #1   Title  Pt/wife will verbalize understanding of fall prevention in the home environment.  TARGET 10/06/17    Time  9    Period  Weeks    Status  Achieved      PT LONG TERM GOAL #2   Title  Pt will improve gait velocity to at least 1.8 ft/sec for decreased fall risk/improved efficiency and safety with gait.    Baseline  2.66 ft/sec with cane; 2.85 ft/sec    Time  9    Period  Weeks    Status  Achieved      PT LONG TERM GOAL #3   Title  Pt will improve Berg Balance score to at least 42/56  for decreased fall risk.  Upgrading goal to [employer] for decreased fall risk    Time  9    Period  Weeks    Status  Achieved      PT LONG TERM GOAL #4   Title  Pt will improve TUG score to less than or equal to 20 seconds for decreased fall risk.  Upgrading goal to  TUG < or equal to 15 seconds for decreased fall risk.    Baseline  TUG 15.35 cane, TUG 14.31 sec     Time  9    Period  Weeks    Status  Achieved      PT LONG TERM GOAL #5   Title  Pt will ambulate at least 50-100 ft using cane, with supervision for improved independence with gait with lesser assistive device.  Upgrading goal to at least 500 ft, indoor and outdoor with cane, supervision.    Time  9    Period  Weeks    Status  Achieved      PT LONG TERM GOAL #6   Title  Pt will improve TUG score to less than or equal to 13.5 seconds for decreased fall risk.    Time  4 per recert 63/14/97    Period  Weeks    Status  Achieved      PT LONG TERM GOAL #7   Title  Pt will improve DGI to at least 19/24 for decreased fall risk.    Time  4 per recert 02/63/78    Period  Weeks    Status  Achieved      PT LONG TERM GOAL #8   Title  Pt will be independent with updated HEP.    Time  4 per recert 58/85/02    Period  Weeks    Status  Achieved            Plan - 11/01/17 1207    Clinical Impression Statement  LTGs assessed this visit, with pt meeting remaining LTGs 6-8 (LTG 1-5 were previously assessed).  Pt has improved functional mobility overall, with progression from gait with RW to gait with cane outdoors and no device indoors.  Pt is appropriate for d/c at this time.    Rehab Potential  Good    PT Frequency  2x / week    PT Duration  4 weeks    PT Treatment/Interventions  ADLs/Self Care Home Management;Functional mobility training;Gait training;Therapeutic activities;DME Instruction;Therapeutic exercise;Balance training;Neuromuscular re-education;Patient/family education;Orthotic Fit/Training    PT Next Visit Plan  Discharge this visit.    Consulted and Agree with Plan of Care  Patient       Patient will benefit from skilled therapeutic intervention in order to improve the following deficits and impairments:  Abnormal gait, Decreased balance, Decreased mobility, Decreased  strength, Difficulty walking  Visit Diagnosis: Unsteadiness on feet  Other abnormalities of gait and mobility   G-Codes - 2017/11/01 1210    Functional Assessment Tool Used (Outpatient Only)  Berg 50/56, TUG 12.59 sec no device; DGI 19/24    Functional Limitation  Mobility: Walking and moving around    Mobility: Walking and Moving Around Goal Status [phone])  At least 20 percent but less than 40 percent impaired, limited or restricted    Mobility: Walking and Moving Around Discharge Status [phone])  At least 1 percent but less than 20 percent impaired, limited or restricted       Problem List Patient Active Problem List   Diagnosis Date  Noted  . Chronic anticoagulation 09/04/2017  . Acute blood loss anemia   . Labile blood glucose   . PAF (paroxysmal atrial fibrillation) ([place])   . Hypoalbuminemia due to protein-calorie malnutrition ([place])   . Diabetes mellitus ([place])   . Benign essential HTN   . Ataxia, post-stroke   . 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 Dizziness 07/25/2017  . Hypothyroidism 09/13/2016  . BMI 26.53,adult 08/28/2015  . Medicare annual wellness visit, subsequent 08/28/2015  . 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 Vitamin D deficiency 04/29/2014  . Medication management 01/03/2014  . 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 BPH (benign prostatic hyperplasia)   . Testosterone deficiency   . 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 Anemia 11/07/2013  . Essential hypertension 11/07/2013  . Hyperlipidemia 11/07/2013  . Paroxysmal atrial fibrillation ([place]) 09/18/2012    MARRIOTT,AMY W. 10/10/2017, 12:15 PM  Frazier Butt., PT  [facility] [address] [place], [place], [address] Phone: [phone]   Fax:  [phone]  Name: Earl [name] MRN: [id number] Date of Birth: 1941-08-26   PHYSICAL THERAPY  DISCHARGE SUMMARY  Visits from Start of Care: 16  Current functional level related to goals / functional outcomes: PT Long Term Goals - 10/10/17 1024      PT LONG TERM GOAL #1   Title  Pt/wife will verbalize understanding of fall prevention in the home environment.  TARGET 10/06/17    Time  9    Period  Weeks    Status  Achieved      PT LONG TERM GOAL #2   Title  Pt will improve gait velocity to at least 1.8 ft/sec for decreased fall risk/improved efficiency and safety with gait.    Baseline  2.66 ft/sec with cane; 2.85 ft/sec    Time  9    Period  Weeks    Status  Achieved      PT LONG TERM GOAL #3   Title  Pt will improve Berg Balance score to at least 42/56 for decreased fall risk.  Upgrading goal to [employer] for decreased fall risk    Time  9    Period  Weeks    Status  Achieved      PT LONG TERM GOAL #4   Title  Pt will improve TUG score to less than or equal to 20 seconds for decreased fall risk.  Upgrading goal to TUG < or equal to 15 seconds for decreased fall risk.    Baseline  TUG 15.35 cane, TUG 14.31 sec     Time  9    Period  Weeks    Status  Achieved      PT LONG TERM GOAL #5   Title  Pt will ambulate at least 50-100 ft using cane, with supervision for improved independence with gait with lesser assistive device.  Upgrading goal to at least 500 ft, indoor and outdoor with cane, supervision.    Time  9    Period  Weeks    Status  Achieved      PT LONG TERM GOAL #6   Title  Pt will improve TUG score to less than or equal to 13.5 seconds for decreased fall risk.    Time  4 per recert 38/18/29    Period  Weeks    Status  Achieved      PT LONG TERM GOAL #7  Title  Pt will improve DGI to at least 19/24 for decreased fall risk.    Time  4 per recert 43/73/57    Period  Weeks    Status  Achieved      PT LONG TERM GOAL #8   Title  Pt will be independent with updated HEP.    Time  4 per recert 89/78/47    Period  Weeks    Status  Achieved      Pt has  met all LTGs.   Remaining deficits: RLE ataxia, weakness-improving   Education / Equipment: Pt educated in HEP, fall prevention education, continued community fitness.  Plan: Patient agrees to discharge.  Patient goals were met. Patient is being discharged due to meeting the stated rehab goals.  ?????   Mady Haagensen, PT 10/10/17 12:17 PM Phone: [phone] Fax: [phone]

## 2017-10-19 ENCOUNTER — Telehealth: Payer: Self-pay | Admitting: Cardiology

## 2017-10-19 NOTE — Telephone Encounter (Signed)
Spoke w/ pt and requested that he send a manual transmission b/c his home monitor has not updated in at least 14 days.   

## 2017-10-25 ENCOUNTER — Other Ambulatory Visit (HOSPITAL_BASED_OUTPATIENT_CLINIC_OR_DEPARTMENT_OTHER): Payer: Medicare Other

## 2017-10-25 DIAGNOSIS — D472 Monoclonal gammopathy: Secondary | ICD-10-CM

## 2017-10-25 LAB — COMPREHENSIVE METABOLIC PANEL
ALBUMIN: 3.5 g/dL (ref 3.5–5.0)
ALK PHOS: 85 U/L (ref 40–150)
ALT: 19 U/L (ref 0–55)
ANION GAP: 10 meq/L (ref 3–11)
AST: 17 U/L (ref 5–34)
BILIRUBIN TOTAL: 0.48 mg/dL (ref 0.20–1.20)
BUN: 13.1 mg/dL (ref 7.0–26.0)
CO2: 21 meq/L — AB (ref 22–29)
Calcium: 9.1 mg/dL (ref 8.4–10.4)
Chloride: 109 mEq/L (ref 98–109)
Creatinine: 1.1 mg/dL (ref 0.7–1.3)
Glucose: 160 mg/dl — ABNORMAL HIGH (ref 70–140)
Potassium: 4.2 mEq/L (ref 3.5–5.1)
Sodium: 140 mEq/L (ref 136–145)
TOTAL PROTEIN: 6 g/dL — AB (ref 6.4–8.3)

## 2017-10-25 LAB — CBC WITH DIFFERENTIAL/PLATELET
BASO%: 0.9 % (ref 0.0–2.0)
Basophils Absolute: 0 10*3/uL (ref 0.0–0.1)
EOS%: 3.7 % (ref 0.0–7.0)
Eosinophils Absolute: 0.2 10*3/uL (ref 0.0–0.5)
HEMATOCRIT: 37.6 % — AB (ref 38.4–49.9)
HEMOGLOBIN: 12.5 g/dL — AB (ref 13.0–17.1)
LYMPH#: 2.1 10*3/uL (ref 0.9–3.3)
LYMPH%: 38.4 % (ref 14.0–49.0)
MCH: 28.7 pg (ref 27.2–33.4)
MCHC: 33.2 g/dL (ref 32.0–36.0)
MCV: 86.3 fL (ref 79.3–98.0)
MONO#: 0.5 10*3/uL (ref 0.1–0.9)
MONO%: 9.7 % (ref 0.0–14.0)
NEUT%: 47.3 % (ref 39.0–75.0)
NEUTROS ABS: 2.6 10*3/uL (ref 1.5–6.5)
Platelets: 184 10*3/uL (ref 140–400)
RBC: 4.35 10*6/uL (ref 4.20–5.82)
RDW: 13.5 % (ref 11.0–14.6)
WBC: 5.4 10*3/uL (ref 4.0–10.3)

## 2017-10-26 ENCOUNTER — Telehealth: Payer: Self-pay | Admitting: Internal Medicine

## 2017-10-26 ENCOUNTER — Ambulatory Visit (INDEPENDENT_AMBULATORY_CARE_PROVIDER_SITE_OTHER): Payer: Medicare Other | Admitting: *Deleted

## 2017-10-26 DIAGNOSIS — I639 Cerebral infarction, unspecified: Secondary | ICD-10-CM | POA: Diagnosis not present

## 2017-10-26 LAB — KAPPA/LAMBDA LIGHT CHAINS
IG KAPPA FREE LIGHT CHAIN: 12.7 mg/L (ref 3.3–19.4)
IG LAMBDA FREE LIGHT CHAIN: 12.9 mg/L (ref 5.7–26.3)
KAPPA/LAMBDA FLC RATIO: 0.98 (ref 0.26–1.65)

## 2017-10-26 NOTE — Telephone Encounter (Signed)
New message      1. Has your device fired? no  2. Is you device beeping? no  3. Are you experiencing draining or swelling at device site? no  4. Are you calling to see if we received your device transmission?  Did you receive the transmission  5. Have you passed out? no    Please route to [place]

## 2017-10-26 NOTE — Telephone Encounter (Signed)
Informed patient that remote transmission was not received. Patient/wife were instructed to call tech svcs. [phone].

## 2017-10-26 NOTE — Telephone Encounter (Signed)
Spoke w/ pt and instructed him how to send remote transmission. Pt verbalized understanding. Transmission received.

## 2017-10-27 NOTE — Progress Notes (Signed)
[facility] Summary Report / Loop Recorder 

## 2017-10-28 LAB — MULTIPLE MYELOMA PANEL, SERUM
ALBUMIN/GLOB SERPL: 1.5 (ref 0.7–1.7)
ALPHA 1: 0.2 g/dL (ref 0.0–0.4)
ALPHA2 GLOB SERPL ELPH-MCNC: 0.7 g/dL (ref 0.4–1.0)
Albumin SerPl Elph-Mcnc: 3.4 g/dL (ref 2.9–4.4)
B-GLOBULIN SERPL ELPH-MCNC: 1 g/dL (ref 0.7–1.3)
GLOBULIN, TOTAL: 2.4 g/dL (ref 2.2–3.9)
Gamma Glob SerPl Elph-Mcnc: 0.6 g/dL (ref 0.4–1.8)
IGM (IMMUNOGLOBIN M), SRM: 14 mg/dL — AB (ref 15–143)
IgA, Qn, Serum: 265 mg/dL (ref 61–437)
IgG, Qn, Serum: 606 mg/dL — ABNORMAL LOW (ref 700–1600)
M PROTEIN SERPL ELPH-MCNC: 0.3 g/dL — AB
Total Protein: 5.8 g/dL — ABNORMAL LOW (ref 6.0–8.5)

## 2017-10-31 ENCOUNTER — Telehealth: Payer: Self-pay | Admitting: Oncology

## 2017-10-31 NOTE — Telephone Encounter (Signed)
Patient called in to reschedule due to weather °

## 2017-11-01 ENCOUNTER — Ambulatory Visit: Payer: Medicare Other | Admitting: Oncology

## 2017-11-04 LAB — CUP PACEART REMOTE DEVICE CHECK
Implantable Pulse Generator Implant Date: 20180907
MDC IDC SESS DTM: 20181206213610

## 2017-11-20 ENCOUNTER — Ambulatory Visit: Payer: Self-pay | Admitting: Internal Medicine

## 2017-11-22 ENCOUNTER — Telehealth: Payer: Self-pay | Admitting: Nurse Practitioner

## 2017-11-27 ENCOUNTER — Ambulatory Visit (INDEPENDENT_AMBULATORY_CARE_PROVIDER_SITE_OTHER): Payer: Medicare Other | Admitting: *Deleted

## 2017-11-27 DIAGNOSIS — I639 Cerebral infarction, unspecified: Secondary | ICD-10-CM

## 2017-11-27 NOTE — Progress Notes (Signed)
[facility] Summary Report / Loop Recorder 

## 2017-11-28 ENCOUNTER — Telehealth: Payer: Self-pay | Admitting: Cardiology

## 2017-11-28 NOTE — Telephone Encounter (Signed)
Spoke w/ pt wife and requested that he send a manual transmission b/c his home monitor has not updated in at least 14 days.   

## 2017-12-08 ENCOUNTER — Other Ambulatory Visit: Payer: Self-pay | Admitting: Internal Medicine

## 2017-12-08 LAB — CUP PACEART REMOTE DEVICE CHECK
Implantable Pulse Generator Implant Date: 20180907
MDC IDC SESS DTM: 20190105214113

## 2017-12-08 MED ORDER — PREDNISONE 10 MG PO TABS: ORAL_TABLET | ORAL | 0 refills | Status: DC

## 2017-12-08 MED ORDER — AZITHROMYCIN 250 MG PO TABS: ORAL_TABLET | ORAL | 0 refills | Status: DC

## 2017-12-12 ENCOUNTER — Telehealth: Payer: Self-pay | Admitting: Oncology

## 2017-12-12 ENCOUNTER — Inpatient Hospital Stay: Payer: Medicare Other | Attending: Oncology | Admitting: Oncology

## 2017-12-12 VITALS — BP 152/82 | HR 72 | Temp 97.7°F | Resp 16 | Ht 71.0 in | Wt 184.3 lb

## 2017-12-12 DIAGNOSIS — E119 Type 2 diabetes mellitus without complications: Secondary | ICD-10-CM

## 2017-12-12 DIAGNOSIS — I10 Essential (primary) hypertension: Secondary | ICD-10-CM

## 2017-12-12 DIAGNOSIS — I639 Cerebral infarction, unspecified: Secondary | ICD-10-CM

## 2017-12-12 DIAGNOSIS — D472 Monoclonal gammopathy: Secondary | ICD-10-CM

## 2017-12-12 NOTE — Progress Notes (Signed)
Hematology and Oncology Follow Up Visit  Earl [name] [id number] [birth year] 77 y.o. 12/12/2017 9:38 AM [name], MDMcKeown, [name], MD   Principle Diagnosis: 77 year old man with IgA lambda MGUS diagnosed in 2004.  He presented with an M spike of 0.3 g/dL without any evidence of endorgan damage.  Prior Therapy: Status post bone marrow biopsy on 02/14/2003 which showed 2% plasma cell infiltration.  Current therapy: Observation and surveillance.   Interim History:  Mr. Mccarley here for a follow-up visit with his wife.  He developed a stroke in September 2018 that required hospitalization and rehab stay.  He developed right-sided weakness in his upper and lower extremities that is currently improving.  He is able to ambulate with the help of a cane without any falls or syncope.  He regained his appetite and is able to eat well and maintain his weight. He has not reported any opportunistic infections. He does not report any peripheral neuropathy or constitutional symptoms.   He does not report any headaches or blurry vision or syncope. He does not report any fevers, chills or sweats. He does not report any chest pain, shortness of breath or difficulty breathing. He does not report any nausea, vomiting or abdominal pain. He does not report any frequency urgency or hesitancy. He does not report any skeletal complaints. Rest of his review of systems is negative.  Medications: I have reviewed the patient's current medications.  Current Outpatient Medications  Medication Sig Dispense Refill  . apixaban (ELIQUIS) 5 MG TABS tablet Take 1 tablet (5 mg total) by mouth 2 (two) times daily. 180 tablet 1  . atorvastatin (LIPITOR) 40 MG tablet Take 1 tablet (40 mg total) by mouth daily at 6 PM. 90 tablet 1  . azithromycin (ZITHROMAX) 250 MG tablet Take 2 tablets (500 mg) on  Day 1,  followed by 1 tablet (250 mg) once daily on Days 2 through 5. 6 each 0  . Cholecalciferol (VITAMIN D PO) Take 5,000  Units by mouth daily.    . Cyanocobalamin (B-12) 50 MCG TABS Take 50 mcg by mouth daily.    . finasteride (PROSCAR) 5 MG tablet TAKE 1 TABLET BY MOUTH  EVERY DAY FOR PROSTATE 90 tablet 1  . glipiZIDE (GLUCOTROL) 5 MG tablet Take 1 tablet (5 mg total) by mouth 2 (two) times daily before a meal. 270 tablet 1  . levothyroxine (SYNTHROID, LEVOTHROID) 50 MCG tablet TAKE 1 TABLET BY MOUTH  DAILY BEFORE BREAKFAST 90 tablet 1  . lisinopril (PRINIVIL,ZESTRIL) 5 MG tablet Take 1 tablet (5 mg total) by mouth daily. 90 tablet 1  . Magnesium 250 MG TABS Take 250 mg by mouth daily.    . metFORMIN (GLUCOPHAGE-XR) 500 MG 24 hr tablet TAKE 1 TABLET BY MOUTH 4  TIMES DAILY FOR DIABETES 360 tablet 3  . Multiple Vitamin (MULTIVITAMIN) capsule Take 1 capsule by mouth daily.      . predniSONE (DELTASONE) 10 MG tablet 1 tab 3 x day for 2 days, then 1 tab 2 x day for 2 days, then 1 tab 1 x day for 3 days 13 tablet 0  . vitamin C (ASCORBIC ACID) 500 MG tablet Take 500 mg by mouth daily.       No current facility-administered medications for this visit.      Allergies:  Allergies  Allergen Reactions  . Penicillins Other (See Comments)    Past Medical History, Surgical history, Social history, and Family History were reviewed and updated.   Physical Exam: Blood  pressure (!) 152/82, pulse 72, temperature 97.7 F (36.5 C), temperature source Oral, resp. rate 16, height '5\' 11"'$  (1.803 m), weight 184 lb 4.8 oz (83.6 kg), SpO2 100 %. ECOG: 1 General appearance: Well-appearing gentleman appeared comfortable. Head: Normocephalic, without obvious abnormality  Oropharynx without any ulcers or thrush. Lymph nodes: Cervical, supraclavicular, and axillary nodes normal. Heart:regular rate and rhythm, S1, S2 normal, no murmur, click, rub or gallop Lung:chest clear, no wheezing, rales, normal symmetric air entry Abdomin: soft, non-tender, without masses or organomegaly.  No shifting dullness or ascites. Musculoskeletal: No  joint deformity or effusion. Skin no rashes or lesions.   Lab Results: Lab Results  Component Value Date   WBC 5.4 10/25/2017   HGB 12.5 (L) 10/25/2017   HCT 37.6 (L) 10/25/2017   MCV 86.3 10/25/2017   PLT 184 10/25/2017     Chemistry      Component Value Date/Time   NA 140 10/25/2017 0753   K 4.2 10/25/2017 0753   CL 106 09/18/2017 1148   CL 101 10/23/2012 0802   CO2 21 (L) 10/25/2017 0753   BUN 13.1 10/25/2017 0753   CREATININE 1.1 10/25/2017 0753      Component Value Date/Time   CALCIUM 9.1 10/25/2017 0753   ALKPHOS 85 10/25/2017 0753   AST 17 10/25/2017 0753   ALT 19 10/25/2017 0753   BILITOT 0.48 10/25/2017 0753          Impression and Plan:   77 year old gentleman with the following issues:  1. IgA lambda MGUS: This was diagnosed in 2004 without any evidence of endorgan damage or changes in his protein studies.  Laboratory data obtained in December 2018 were personally reviewed including his protein studies and quantitative immunoglobulins.  His M spike is 0.3 g/dL without any evidence of endorgan damage.  The natural course of this disease was reviewed again today.  Risks and benefits of continued observation at this time was discussed.  After discussion today is willing to continue with annual follow-up at this time.  Given the fact that there is a risk of developing symptomatic myeloma in the future he prefers to keep surveillance at this time.  2. Diabetes mellitus: Blood sugar remains manageable at this time.  No recent exacerbations.  3. Hypertension: His blood pressure adequately controlled at this time.   4.  CVA: This was diagnosed in September 2018 and appears to be improving.  5. Follow-up: Will be in one year.   15  minutes was spent with the patient face-to-face today.  More than 50% of time was dedicated to patient counseling, education and coordination of  his multifaceted care.   Zola Button, MD 1/22/20199:38 AM

## 2017-12-12 NOTE — Telephone Encounter (Signed)
Gave avs and calendar for January 2020 °

## 2017-12-13 ENCOUNTER — Telehealth: Payer: Self-pay | Admitting: Cardiology

## 2017-12-13 NOTE — Telephone Encounter (Signed)
Spoke w/ pt and requested that he send a manual transmission b/c his home monitor has not updated in at least 14 days.   

## 2017-12-19 ENCOUNTER — Other Ambulatory Visit: Payer: Self-pay | Admitting: Internal Medicine

## 2017-12-20 DIAGNOSIS — N182 Chronic kidney disease, stage 2 (mild): Secondary | ICD-10-CM

## 2017-12-20 DIAGNOSIS — E1122 Type 2 diabetes mellitus with diabetic chronic kidney disease: Secondary | ICD-10-CM | POA: Insufficient documentation

## 2017-12-20 NOTE — Progress Notes (Signed)
MEDICARE ANNUAL WELLNESS VISIT AND FOLLOW UP Assessment:   Diagnoses and all orders for this visit:  Medicare annual wellness visit, subsequent  History of CVA with residual deficit Continue close monitoring of BP, glucose - continue elequis, statin therapy Follow up with neurology as recommended  Paroxysmal atrial fibrillation ([place]) CHADSVASc - 6 ; Treated by elequis; followed by cardiology  Essential hypertension At goal; continue medications Monitor blood pressure at home; call if consistently over 130/80 Continue DASH diet.   Reminder to go to the ER if any CP, SOB, nausea, dizziness, severe HA, changes vision/speech, left arm numbness and tingling and jaw pain.  Type 2 diabetes mellitus with stage 2 chronic kidney disease, without long-term current use of insulin (HCC) Continue medications: metformin, glipizide Continue diet and exercise.  Perform daily foot/skin check, notify office of any concerning changes.  Reminded needs annual eye exam, dentist visit q6 months Foot exam performed -     Hemoglobin A1c  Hypothyroidism, unspecified type TSHs stable; continue medications the same reminded to take on an empty stomach 30-17mins before food.  -     TSH  CKD stage 2 due to type 2 diabetes mellitus (HCC) Increase fluids, avoid NSAIDS, monitor sugars, will monitor -     BASIC METABOLIC PANEL WITH GFR  Benign prostatic hyperplasia with urinary frequency Symptoms stable with finasteride therapy; refer to urology as indicated by progressive symptoms  MGUS (monoclonal gammopathy of unknown significance) Followed by Dr. Alen Blew; just went 2 weeks ago, doing well.   Anemia, unspecified type -     CBC with Differential/Platelet  Hyperlipidemia, unspecified hyperlipidemia type At goal; continue statin medication Continue low cholesterol diet and exercise.  -     Lipid panel  Medication management -     CBC with Differential/Platelet -     BASIC METABOLIC PANEL WITH GFR -      Hepatic function panel  Vitamin D deficiency Continue supplementation for goal of 70-100 Defer vitamin D level as at goal at recent check without dose change  Hypoalbuminemia due to protein-calorie malnutrition (HCC) -     Hepatic function panel  Ataxia, post-stroke Significantly improved with intensive PT/OT  Labile blood glucose -     Hemoglobin A1c  Over 30 minutes of exam, counseling, chart review, and critical decision making was performed  Future Appointments  Date Time Provider [place]  12/25/2017 10:55 AM [facility] DEVICE REMOTES [facility]  02/08/2018  2:15 PM Dennie Bible, NP [facility] None  04/05/2018 11:20 AM Jamse Arn, MD [facility]  05/10/2018 10:00 AM [name], MD [facility] None  12/12/2018 11:00 AM [facility] LAB 1 [facility] None  12/19/2018  9:30 AM Wyatt Portela, MD [facility] None     Plan:   During the course of the visit the patient was educated and counseled about appropriate screening and preventive services including:    Pneumococcal vaccine   Influenza vaccine  Prevnar 13  Td vaccine  Screening electrocardiogram  Colorectal cancer screening  Diabetes screening  Glaucoma screening  Nutrition counseling    Subjective:  Earl [name] is a 77 y.o. male who presents for Medicare Annual Wellness Visit and 3 month follow up for HTN, hyperlipidemia, T2DM, hypothyroidism, and vitamin D Def. 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 He was discharged from in-patient on Sept 18 and received intensive therapies via home health. He continues to use a walker for security, but does ambulate some independently  in his home. Questionable hx of a. Fib was noted while hospitalized with stroke; previously negative on 30 day holter; TEE was negative, loop recorder was placed which did note an episode of a. Fib/flutter. He has been started on  elequis and followed closely by cardiology. 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 He is followed by neurology as well.   BMI is Body mass index is 25.24 kg/m., he has been working on diet and exercise. Wt Readings from Last 3 Encounters:  12/21/17 181 lb (82.1 kg)  12/12/17 184 lb 4.8 oz (83.6 kg)  09/18/17 178 lb 6.4 oz (80.9 kg)   His blood pressure has been controlled at home, today their BP is BP: 128/78 He does workout. He denies chest pain, shortness of breath, dizziness.   He is on cholesterol medication and denies myalgias. His cholesterol is at goal. The cholesterol last visit was:   Lab Results  Component Value Date   CHOL 147 07/26/2017   HDL 51 07/26/2017   LDLCALC 87 07/26/2017   TRIG 45 07/26/2017   CHOLHDL 2.9 07/26/2017   He has been working on diet and exercise for T2 prediabetes, and denies increased appetite, nausea, polydipsia, polyuria, visual disturbances, vomiting and weight loss. He has refused a glucometer and checks urine for glucose without recent concerns. Last A1C in the office was:  Lab Results  Component Value Date   HGBA1C 6.7 (H) 07/26/2017   Last GFR Lab Results  Component Value Date   GFRAA 84 09/18/2017   Patient is on Vitamin D supplement and at goal at most recent check:   Lab Results  Component Value Date   VD25OH 81 09/18/2017      Medication Review: Current Outpatient Medications on File Prior to Visit  Medication Sig Dispense Refill  . apixaban (ELIQUIS) 5 MG TABS tablet Take 1 tablet (5 mg total) by mouth 2 (two) times daily. 180 tablet 1  . atorvastatin (LIPITOR) 40 MG tablet TAKE 1 TABLET BY MOUTH  DAILY AT 6 PM. 90 tablet 1  . Cholecalciferol (VITAMIN D PO) Take 5,000 Units by mouth daily.    . Cyanocobalamin (B-12) 50 MCG TABS Take 50 mcg by mouth daily.    . finasteride (PROSCAR) 5 MG tablet TAKE 1 TABLET BY MOUTH  EVERY DAY FOR PROSTATE 90 tablet 1  . glipiZIDE (GLUCOTROL) 5 MG tablet  Take 1 tablet (5 mg total) by mouth 2 (two) times daily before a meal. 270 tablet 1  . levothyroxine (SYNTHROID, LEVOTHROID) 50 MCG tablet TAKE 1 TABLET BY MOUTH  DAILY BEFORE BREAKFAST 90 tablet 1  . lisinopril (PRINIVIL,ZESTRIL) 5 MG tablet TAKE 1 TABLET BY MOUTH  DAILY 90 tablet 1  . Magnesium 250 MG TABS Take 250 mg by mouth daily.    . metFORMIN (GLUCOPHAGE-XR) 500 MG 24 hr tablet TAKE 1 TABLET BY MOUTH 4  TIMES DAILY FOR DIABETES 360 tablet 3  . Multiple Vitamin (MULTIVITAMIN) capsule Take 1 capsule by mouth daily.      . vitamin C (ASCORBIC ACID) 500 MG tablet Take 500 mg by mouth daily.      [name] azithromycin (ZITHROMAX) 250 MG tablet Take 2 tablets (500 mg) on  Day 1,  followed by 1 tablet (250 mg) once daily on Days 2 through 5. 6 each 0  . predniSONE (DELTASONE) 10 MG tablet 1 tab 3 x day for 2 days, then 1 tab 2 x day for 2 days, then 1  tab 1 x day for 3 days 13 tablet 0   No current facility-administered medications on file prior to visit.     Allergies: Allergies  Allergen Reactions  . Penicillins Other (See Comments)    Current Problems (verified) has Paroxysmal atrial fibrillation ([place]); MGUS (monoclonal gammopathy of unknown significance); Anemia; Essential hypertension; Hyperlipidemia; Type 2 diabetes mellitus (HCC); BPH (benign prostatic hyperplasia); Medication management; Vitamin D deficiency; Diabetic mononeuritis multiplex, right abdomen; Medicare annual wellness visit, subsequent; Hypothyroidism; History of CVA with residual deficit; Hypoalbuminemia due to protein-calorie malnutrition ([place]); Ataxia, post-stroke; Labile blood glucose; Chronic anticoagulation; and CKD stage 2 due to type 2 diabetes mellitus ([place]) on their problem list.  Screening Tests Immunization History  Administered Date(s) Administered  . DT 02/09/2015  . Influenza Split 08/07/2013  . Influenza, High Dose Seasonal PF 08/19/2014, 08/28/2015, 09/13/2016, 08/15/2017  . Pneumococcal Conjugate-13  06/10/2016  . Pneumococcal Polysaccharide-23 08/13/2008  . Td 11/22/2003    Preventative care: Last colonoscopy: 2013 DUE for 5 year follow up - is postponing at this time due to recent stroke; is established with GI and has phone number to call and arrange when ready.   Prior vaccinations: TD or Tdap: 2016  Influenza: 2018 Pneumococcal: 2009 Prevnar13: 2017 Shingles/Zostavax: n/a in office   Names of Other Physician/Practitioners you currently use: 1. [facility] here for primary care 2. Dr. Delman Cheadle, eye doctor, last visit 08/2017 Need report -  3. Dr. Ennis Forts, dentist, last visit 2018  7Patient Care Team: [name], MD as PCP - General (Internal Medicine) Inda Castle, MD (Inactive) as Consulting Physician (Gastroenterology) Wyatt Portela, MD as Consulting Physician (Oncology) Sharyne Peach, MD as Consulting Physician (Ophthalmology)  Surgical: He  has a past surgical history that includes Inguinal hernia repair; TEE without cardioversion (N/A, 07/28/2017); and LOOP RECORDER INSERTION (N/A, 07/28/2017). Family His family history includes Diabetes in his father; Sickle cell anemia in his mother; Stomach cancer in his father. Social history  He reports that  has never smoked. he has never used smokeless tobacco. He reports that he does not drink alcohol or use drugs.  MEDICARE WELLNESS OBJECTIVES: Physical activity: Current Exercise Habits: Home exercise routine, Type of exercise: walking;treadmill, Time (Minutes): 45, Frequency (Times/Week): 7, Weekly Exercise (Minutes/Week): 315, Intensity: Mild, Exercise limited by: neurologic condition(s) Cardiac risk factors: Cardiac Risk Factors include: advanced age (>42men, >28 women);diabetes mellitus;dyslipidemia;hypertension;male gender Depression/mood screen:   Depression screen [facility] 2/9 12/21/2017  Decreased Interest 0  Down, Depressed, Hopeless 0  PHQ - 2 Score 0  Altered sleeping -   Tired, decreased energy -  Change in appetite -  Feeling bad or failure about yourself  -  Trouble concentrating -  Moving slowly or fidgety/restless -  Suicidal thoughts -  PHQ-9 Score -  Difficult doing work/chores -    ADLs:  In your present state of health, do you have any difficulty performing the following activities: 12/21/2017 09/18/2017  Hearing? N N  Vision? N N  Difficulty concentrating or making decisions? N N  Walking or climbing stairs? N Y  Comment Minor deficiencies not interfering with ADLS using a walker since recent stroke   Dressing or bathing? N N  Doing errands, shopping? Y Y  Comment No longer driving; driven by wife Wife is driving for now since his recent stroke.  Preparing Food and eating ? N -  Using the Toilet? N -  In the past six months, have you accidently leaked urine? N -  Do you have problems with loss of bowel control? N -  Managing your Medications? N -  Managing your Finances? N -  Housekeeping or managing your Housekeeping? Y -  Comment Needs help with yardwork or heavy lifting.  -  Some recent data might be hidden     Cognitive Testing  Alert? Yes  Normal Appearance?Yes  Oriented to person? Yes  Place? Yes   Time? Yes  Recall of three objects?  Yes  Can perform simple calculations? Yes  Displays appropriate judgment?Yes  Can read the correct time from a watch face?Yes  EOL planning: Does Patient Have a Medical Advance Directive?: Yes Type of Advance Directive: Healthcare Power of Attorney, Living will Does patient want to make changes to medical advance directive?: No - Patient declined Copy of [place] Chart?: No - copy requested   Objective:   Today's Vitals   12/21/17 1100  BP: 128/78  Pulse: 84  Temp: 97.7 F (36.5 C)  SpO2: 99%  Weight: 181 lb (82.1 kg)  Height: 5\' 11"  (1.803 m)   Body mass index is 25.24 kg/m.  General appearance: alert, no distress, WD/WN, male HEENT: normocephalic,  sclerae anicteric, TMs pearly, nares patent, no discharge or erythema, pharynx normal Oral cavity: MMM, no lesions Neck: supple, no lymphadenopathy, no thyromegaly, no masses Heart: RRR, normal S1, S2, no murmurs Lungs: CTA bilaterally, no wheezes, rhonchi, or rales Abdomen: +bs, soft, non tender, non distended, no masses, no hepatomegaly, no splenomegaly Musculoskeletal: nontender, no swelling, no obvious deformity Extremities: no edema, no cyanosis, no clubbing Pulses: 2+ symmetric, upper and lower extremities, normal cap refill Neurological: alert, oriented x 3, CN2-12 intact, strength normal upper extremities and lower extremities, sensation normal throughout except feet with some loss of sensation of soles bilaterally, DTRs 2+ throughout, no cerebellar signs, gait slow but steady, assisted by cane Psychiatric: normal affect, behavior normal, pleasant   Medicare Attestation I have personally reviewed: The patient's medical and social history Their use of alcohol, tobacco or illicit drugs Their current medications and supplements The patient's functional ability including ADLs,fall risks, home safety risks, cognitive, and hearing and visual impairment Diet and physical activities Evidence for depression or mood disorders  The patient's weight, height, BMI, and visual acuity have been recorded in the chart.  I have made referrals, counseling, and provided education to the patient based on review of the above and I have provided the patient with a written personalized care plan for preventive services.     Izora Ribas, NP   12/21/2017

## 2017-12-21 ENCOUNTER — Encounter: Payer: Self-pay | Admitting: Adult Health

## 2017-12-21 ENCOUNTER — Ambulatory Visit (INDEPENDENT_AMBULATORY_CARE_PROVIDER_SITE_OTHER): Payer: Medicare Other | Admitting: Adult Health

## 2017-12-21 VITALS — BP 128/78 | HR 84 | Temp 97.7°F | Ht 71.0 in | Wt 181.0 lb

## 2017-12-21 DIAGNOSIS — I10 Essential (primary) hypertension: Secondary | ICD-10-CM | POA: Diagnosis not present

## 2017-12-21 DIAGNOSIS — E039 Hypothyroidism, unspecified: Secondary | ICD-10-CM | POA: Diagnosis not present

## 2017-12-21 DIAGNOSIS — I69393 Ataxia following cerebral infarction: Secondary | ICD-10-CM

## 2017-12-21 DIAGNOSIS — E785 Hyperlipidemia, unspecified: Secondary | ICD-10-CM | POA: Diagnosis not present

## 2017-12-21 DIAGNOSIS — E559 Vitamin D deficiency, unspecified: Secondary | ICD-10-CM | POA: Diagnosis not present

## 2017-12-21 DIAGNOSIS — E1141 Type 2 diabetes mellitus with diabetic mononeuropathy: Secondary | ICD-10-CM | POA: Diagnosis not present

## 2017-12-21 DIAGNOSIS — E1122 Type 2 diabetes mellitus with diabetic chronic kidney disease: Secondary | ICD-10-CM | POA: Diagnosis not present

## 2017-12-21 DIAGNOSIS — Z0001 Encounter for general adult medical examination with abnormal findings: Secondary | ICD-10-CM

## 2017-12-21 DIAGNOSIS — Z0000 Encounter for general adult medical examination without abnormal findings: Secondary | ICD-10-CM

## 2017-12-21 DIAGNOSIS — G587 Mononeuritis multiplex: Secondary | ICD-10-CM

## 2017-12-21 DIAGNOSIS — Z79899 Other long term (current) drug therapy: Secondary | ICD-10-CM | POA: Diagnosis not present

## 2017-12-21 DIAGNOSIS — D472 Monoclonal gammopathy: Secondary | ICD-10-CM

## 2017-12-21 DIAGNOSIS — I6930 Unspecified sequelae of cerebral infarction: Secondary | ICD-10-CM

## 2017-12-21 DIAGNOSIS — R6889 Other general symptoms and signs: Secondary | ICD-10-CM | POA: Diagnosis not present

## 2017-12-21 DIAGNOSIS — R7309 Other abnormal glucose: Secondary | ICD-10-CM

## 2017-12-21 DIAGNOSIS — I480 Paroxysmal atrial fibrillation: Secondary | ICD-10-CM

## 2017-12-21 DIAGNOSIS — E46 Unspecified protein-calorie malnutrition: Secondary | ICD-10-CM | POA: Diagnosis not present

## 2017-12-21 DIAGNOSIS — D649 Anemia, unspecified: Secondary | ICD-10-CM

## 2017-12-21 DIAGNOSIS — N401 Enlarged prostate with lower urinary tract symptoms: Secondary | ICD-10-CM | POA: Diagnosis not present

## 2017-12-21 DIAGNOSIS — E8809 Other disorders of plasma-protein metabolism, not elsewhere classified: Secondary | ICD-10-CM

## 2017-12-21 DIAGNOSIS — R350 Frequency of micturition: Secondary | ICD-10-CM

## 2017-12-21 DIAGNOSIS — N182 Chronic kidney disease, stage 2 (mild): Secondary | ICD-10-CM

## 2017-12-21 NOTE — Patient Instructions (Signed)
Here is some information to help you keep your heart healthy: Move it! - Aim for 30 mins of activity every day. Take it slowly at first. Talk to us before starting any new exercise program.   Lose it.  -Body Mass Index (BMI) can indicate if you need to lose weight. A healthy range is 18.5-24.9. For a BMI calculator, go to [employer]  Waist Management -Excess abdominal fat is a risk factor for heart disease, diabetes, asthma, stroke and more. Ideal waist circumference is less than 35" for women and less than 40" for men.   Eat Right -focus on fruits, vegetables, whole grains, and meals you make yourself. Avoid foods with trans fat and high sugar/sodium content.   Snooze or Snore? - Loud snoring can be a sign of sleep apnea, a significant risk factor for high blood pressure, heart attach, stroke, and heart arrhythmias.  Kick the habit -Quit Smoking! Avoid second hand smoke. A single cigarette raises your blood pressure for 20 mins and increases the risk of heart attack and stroke for the next 24 hours.   Are Aspirin and Supplements right for you? -Add ENTERIC COATED low dose 81 mg Aspirin daily OR can do every other day if you have easy bruising to protect your heart and head. As well as to reduce risk of Colon Cancer by 20 %, Skin Cancer by 26 % , Melanoma by 46% and Pancreatic cancer by 60%  Say "No to Stress -There may be little you can do about problems that cause stress. However, techniques such as long walks, meditation, and exercise can help you manage it.   Start Now! - Make changes one at a time and set reasonable goals to increase your likelihood of success.   

## 2017-12-22 LAB — URINALYSIS W MICROSCOPIC + REFLEX CULTURE
BACTERIA UA: NONE SEEN /HPF
BILIRUBIN URINE: NEGATIVE
Glucose, UA: NEGATIVE
HGB URINE DIPSTICK: NEGATIVE
Hyaline Cast: NONE SEEN /LPF
KETONES UR: NEGATIVE
LEUKOCYTE ESTERASE: NEGATIVE
NITRITES URINE, INITIAL: NEGATIVE
PROTEIN: NEGATIVE
RBC / HPF: NONE SEEN /HPF (ref 0–2)
SQUAMOUS EPITHELIAL / LPF: NONE SEEN /HPF (ref <=5)
Specific Gravity, Urine: 1.016 (ref 1.001–1.03)
WBC UA: NONE SEEN /HPF (ref 0–5)
pH: 6 (ref 5.0–8.0)

## 2017-12-22 LAB — CBC WITH DIFFERENTIAL/PLATELET
BASOS ABS: 30 {cells}/uL (ref 0–200)
Basophils Relative: 0.6 %
EOS ABS: 80 {cells}/uL (ref 15–500)
EOS PCT: 1.6 %
HCT: 39.1 % (ref 38.5–50.0)
HEMOGLOBIN: 13.6 g/dL (ref 13.2–17.1)
Lymphs Abs: 1780 cells/uL (ref 850–3900)
MCH: 28.9 pg (ref 27.0–33.0)
MCHC: 34.8 g/dL (ref 32.0–36.0)
MCV: 83.2 fL (ref 80.0–100.0)
MONOS PCT: 10.1 %
MPV: 10.2 fL (ref 7.5–12.5)
NEUTROS ABS: 2605 {cells}/uL (ref 1500–7800)
Neutrophils Relative %: 52.1 %
Platelets: 184 10*3/uL (ref 140–400)
RBC: 4.7 10*6/uL (ref 4.20–5.80)
RDW: 12.7 % (ref 11.0–15.0)
TOTAL LYMPHOCYTE: 35.6 %
WBC mixed population: 505 cells/uL (ref 200–950)
WBC: 5 10*3/uL (ref 3.8–10.8)

## 2017-12-22 LAB — BASIC METABOLIC PANEL WITH GFR
BUN: 14 mg/dL (ref 7–25)
CALCIUM: 9.3 mg/dL (ref 8.6–10.3)
CHLORIDE: 106 mmol/L (ref 98–110)
CO2: 25 mmol/L (ref 20–32)
Creat: 1.03 mg/dL (ref 0.70–1.18)
GFR, Est African American: 81 mL/min/{1.73_m2} (ref >=60)
GFR, Est Non African American: 70 mL/min/{1.73_m2} (ref >=60)
Glucose, Bld: 116 mg/dL — ABNORMAL HIGH (ref 65–99)
Potassium: 4.1 mmol/L (ref 3.5–5.3)
Sodium: 140 mmol/L (ref 135–146)

## 2017-12-22 LAB — HEPATIC FUNCTION PANEL
AG Ratio: 1.9 (calc) (ref 1.0–2.5)
ALT: 18 U/L (ref 9–46)
AST: 17 U/L (ref 10–35)
Albumin: 3.8 g/dL (ref 3.6–5.1)
Alkaline phosphatase (APISO): 83 U/L (ref 40–115)
Bilirubin, Direct: 0.1 mg/dL (ref 0.0–0.2)
GLOBULIN: 2 g/dL (ref 1.9–3.7)
Indirect Bilirubin: 0.5 mg/dL (calc) (ref 0.2–1.2)
TOTAL PROTEIN: 5.8 g/dL — AB (ref 6.1–8.1)
Total Bilirubin: 0.6 mg/dL (ref 0.2–1.2)

## 2017-12-22 LAB — LIPID PANEL
CHOL/HDL RATIO: 2.1 (calc) (ref <5.0)
CHOLESTEROL: 109 mg/dL (ref <200)
HDL: 51 mg/dL (ref >40)
LDL CHOLESTEROL (CALC): 42 mg/dL
Non-HDL Cholesterol (Calc): 58 mg/dL (calc) (ref <130)
Triglycerides: 77 mg/dL (ref <150)

## 2017-12-22 LAB — TSH: TSH: 0.26 mIU/L — ABNORMAL LOW (ref 0.40–4.50)

## 2017-12-22 LAB — HEMOGLOBIN A1C
Hgb A1c MFr Bld: 7 % of total Hgb — ABNORMAL HIGH (ref <5.7)
MEAN PLASMA GLUCOSE: 154 (calc)
eAG (mmol/L): 8.5 (calc)

## 2017-12-22 LAB — NO CULTURE INDICATED

## 2017-12-25 ENCOUNTER — Ambulatory Visit (INDEPENDENT_AMBULATORY_CARE_PROVIDER_SITE_OTHER): Payer: Medicare Other | Admitting: *Deleted

## 2017-12-25 DIAGNOSIS — I639 Cerebral infarction, unspecified: Secondary | ICD-10-CM

## 2017-12-26 NOTE — Progress Notes (Signed)
[facility] Summary Report / Loop Recorder 

## 2018-01-05 ENCOUNTER — Other Ambulatory Visit: Payer: Self-pay | Admitting: Internal Medicine

## 2018-01-05 ENCOUNTER — Telehealth: Payer: Self-pay | Admitting: *Deleted

## 2018-01-05 NOTE — Telephone Encounter (Signed)
Patient called and asked if he can take Claitin or Benadryl for allergies. Per Dr Melford Aase, he can take Claritin or Zyrtec in the AM and Benadryl in the PM.  Patient is aware.

## 2018-01-08 ENCOUNTER — Ambulatory Visit: Payer: Medicare Other | Admitting: Nurse Practitioner

## 2018-01-10 ENCOUNTER — Telehealth: Payer: Self-pay | Admitting: Cardiology

## 2018-01-10 NOTE — Telephone Encounter (Signed)
Error

## 2018-01-10 NOTE — Telephone Encounter (Signed)
Spoke w/ pt and requested that he send a manual transmission b/c his home monitor has not updated in at least 14 days.   

## 2018-01-16 LAB — CUP PACEART REMOTE DEVICE CHECK
MDC IDC PG IMPLANT DT: 20180907
MDC IDC SESS DTM: 20190204223859

## 2018-01-24 ENCOUNTER — Telehealth: Payer: Self-pay | Admitting: Cardiology

## 2018-01-24 NOTE — Telephone Encounter (Signed)
LMOVM requesting that pt send manual transmission b/c home monitor has not updated in at least 14 days.    

## 2018-01-27 LAB — CUP PACEART REMOTE DEVICE CHECK
MDC IDC PG IMPLANT DT: 20180907
MDC IDC SESS DTM: 20190309230738

## 2018-01-29 ENCOUNTER — Ambulatory Visit (INDEPENDENT_AMBULATORY_CARE_PROVIDER_SITE_OTHER): Payer: Medicare Other | Admitting: *Deleted

## 2018-01-29 DIAGNOSIS — I639 Cerebral infarction, unspecified: Secondary | ICD-10-CM | POA: Diagnosis not present

## 2018-01-29 NOTE — Progress Notes (Signed)
[facility] Summary Report / Loop Recorder 

## 2018-02-07 ENCOUNTER — Telehealth: Payer: Self-pay | Admitting: Cardiology

## 2018-02-07 NOTE — Telephone Encounter (Signed)
LMOVM requesting that pt send manual transmission b/c home monitor has not updated in at least 14 days.    

## 2018-02-08 ENCOUNTER — Encounter: Payer: Self-pay | Admitting: Adult Health

## 2018-02-08 ENCOUNTER — Ambulatory Visit: Payer: Medicare Other | Admitting: Adult Health

## 2018-02-08 VITALS — BP 136/84 | HR 91 | Wt 189.0 lb

## 2018-02-08 DIAGNOSIS — E785 Hyperlipidemia, unspecified: Secondary | ICD-10-CM

## 2018-02-08 DIAGNOSIS — E1159 Type 2 diabetes mellitus with other circulatory complications: Secondary | ICD-10-CM | POA: Diagnosis not present

## 2018-02-08 DIAGNOSIS — Z7901 Long term (current) use of anticoagulants: Secondary | ICD-10-CM

## 2018-02-08 DIAGNOSIS — I10 Essential (primary) hypertension: Secondary | ICD-10-CM

## 2018-02-08 DIAGNOSIS — I480 Paroxysmal atrial fibrillation: Secondary | ICD-10-CM

## 2018-02-08 DIAGNOSIS — I63441 Cerebral infarction due to embolism of right cerebellar artery: Secondary | ICD-10-CM | POA: Diagnosis not present

## 2018-02-08 NOTE — Patient Instructions (Signed)
Continue Eliquis (apixaban) daily  and lipitor  for secondary stroke prevention  Follow up with your PCP for cholesterol, blood pressure and diabetes management  Cardiologist for atrial fibrillation and Eliquis management  Continue daily exercising and eating healthy   Maintain strict control of hypertension with blood pressure goal below 130/90, diabetes with hemoglobin A1c goal below 6.5% and cholesterol with LDL cholesterol (bad cholesterol) goal below 70 mg/dL. I also advised the patient to eat a healthy diet with plenty of whole grains, cereals, fruits and vegetables, exercise regularly and maintain ideal body weight.  Followup in the future with me in 12 months

## 2018-02-08 NOTE — Progress Notes (Signed)
STROKE NEUROLOGY FOLLOW UP NOTE  NAME: Earl [name] DOB: January 02, 1941  REASON FOR VISIT: stroke follow up HISTORY FROM: pt and wife and chart  Today we had the pleasure of seeing Thornton Dohrmann in follow-up at our Neurology Clinic. Pt was accompanied by wife.   History Summary: Mr. Jaikob Borgwardt is a 77 y.o. male with history of diabetes and hypertension who presented with nausea and dizziness. 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 CTA head and neck unremarkable. EF 55-60%. LDL 87 and A1c 6.6. Patient had history of questionable A. fib episode during colonoscopy in the past, had 30 day cardiac event monitoring showed no A. Fib. TEE and remarkable and Loop recorder placed. Discharge to CIR with aspirin and Plavix as well as Lipitor.  History 09/04/17: During the interval time, the patient has been doing well. While he was in CIR, he was found to have afib/aflutter episode on loop recorder. He was put on eliquis 5mg  bid and DAPT discontinued. Pt doing well still at outpt PT/OT for right sided ataxia. BP good at home, today in clinic 148/89. He did not check finger stick at home but check urine glucose always negative. Continued on eliquis and lipitor without side effects.   UPDATE 02/08/18: Mr. Affeldt is being seen today for a four-month follow-up and is accompanied by his wife.  He has been doing well overall with mild complaints of dizziness.  Dizziness does get worse with increasing fatigue.  Continues to take Eliquis without side effects of increasing bleeding or bruising.  Continues to take Lipitor without side effects of myalgias.  A1c 7.0 LDL 42 on 12/21/2017.  PCP managing diabetes and cholesterol.  Blood pressure at today's visit stable at 136/84.  Uses cane for long distance ambulation and to help with balance issues.  Continues to do at home PT exercises to help with balance.  Denies symptoms of sleep apnea such as  snoring, daytime sleepiness, or frequent napping.  Does have a complaint of excessive salivation but denies dysphasia.  This is new since his stroke but has been improving. Denies new or worsening stroke/TIA symptoms.    REVIEW OF SYSTEMS: Full 14 system review of systems performed and notable only for those listed below and in HPI above, all others are negative:, Excessive salivation, and dizziness   The following represents the patient's updated allergies and side effects list: Allergies  Allergen Reactions  . Penicillins Other (See Comments)    The neurologically relevant items on the patient's problem list were reviewed on today's visit.  Neurologic Examination  A problem focused neurological exam (12 or more points of the single system neurologic examination, vital signs counts as 1 point, cranial nerves count for 8 points) was performed.  Blood pressure 136/84, pulse 91, weight 189 lb (85.7 kg).  General - Well nourished, elderly African-American male, well developed, in no apparent distress.  Ophthalmologic - Sharp disc margins OU.  Cardiovascular - Regular rate and rhythm with no murmur.  Mental Status -  Level of arousal and orientation to time, place, and person were intact. Language including expression, naming, repetition, comprehension was assessed and found intact. Fund of Knowledge was assessed and was intact.  Cranial Nerves II - XII - II - Visual field intact OU. III, IV, VI - Extraocular movements intact. V - Facial sensation intact bilaterally. VII - Facial movement intact bilaterally. VIII - Hearing & vestibular intact bilaterally, no nystagmus. X - Palate elevates symmetrically. XI -  Chin turning & shoulder shrug intact bilaterally. XII - Tongue protrusion intact.  Motor Strength - The patient's strength was normal in all extremities and pronator drift was absent.  Bulk was normal and fasciculations were absent.   Motor Tone - Muscle tone was assessed at  the neck and appendages and was normal.  Reflexes - The patient's reflexes were 2+ in all extremities and he had no pathological reflexes.  Sensory - Light touch, temperature/pinprick, vibration and proprioception, and Romberg testing were assessed and were normal.    Coordination - The patient had dysmetria at RUE and RLE ataxia.  Tremor was absent.  Gait and Station - ambulates short distances without cane, it is cane primarily to assist with balance, mild difficulty with tandem walk but able to raise on his toes and on his heels     Data reviewed: I personally reviewed the images and agree with the radiology interpretations.  Ct Angio Head W Or Wo Contrast Ct Angio Neck W Or Wo Contrast 07/26/2017 IMPRESSION: 1. Patent carotid and vertebral arteries. No dissection, aneurysm, or hemodynamically significant stenosis utilizing NASCET criteria. 2. Patent circle of Willis. No large vessel occlusion, aneurysm, or significant stenosis. 3. Right AICA distribution late acute/early subacute infarction. 4. No hemorrhage or abnormal enhancement in the brain. 5. Lipoma within strap muscles anterior to larynx. 6. Large nodule in left lobe of thyroid, stable from prior thyroid ultrasounds.  Ct Head Wo Contrast 07/25/2017 IMPRESSION: 1. Acute right cerebellar SCA territory infarct. 2. No intracranial hemorrhage.  Mr Brain Wo Contrast 07/26/2017 IMPRESSION: 1. Acute ischemic superior right cerebellar infarct without significant mass effect. Associated mild petechial hemorrhage without frank hemorrhagic transformation. 2. Otherwise normal brain MRI.  TTE 07/26/2017 Study Conclusions - Left ventricle: The cavity size was normal. Wall thickness was increased in a pattern of mild LVH. Systolic function was normal. The estimated ejection fraction was in the range of 55% to 60%. Wall motion was normal; there were no regional wall motion abnormalities. Features are consistent with a pseudonormal  left ventricular filling pattern, with concomitant abnormal relaxation and increased filling pressure (grade 2 diastolic dysfunction). - Aortic valve: There was mild regurgitation. - Mitral valve: There was mild regurgitation. - Right ventricle: The cavity size was mildly dilated. - Pulmonary arteries: Systolic pressure was mildly increased. PA peak pressure: 39 mm Hg (S). Impressions: - Normal LV systolic function; moderate diastolic dysfunction; mild LVH; mild AI; mild MR; mild RVE; mild TR with mildly elevated pulmonary pressure.  TEE 07/28/17 normal    Assessment: As you may recall, he is a 77 y.o. 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 CTA head and neck unremarkable. EF 55-60%. LDL 87 and A1c 6.6. Patient had history of questionable A. fib episode during colonoscopy in the past, had 30 day cardiac event monitoring showed no A. Fib. TEE unremarkable and Loop recorder placed. Discharge to CIR with aspirin and Plavix as well as Lipitor. He was found to have afib/aflutter episode on loop recorder while in CIR. He was put on eliquis 5mg  bid and DAPT discontinued.  Patient doing well at today's visit with mild complaints of dizziness.    Plan:  -Continue Eliquis (apixaban) daily  and lipitor  for secondary stroke prevention -PCP for management of cholesterol, blood pressure and diabetes  -Cardiologist for atrial fibrillation and Eliquis management -Maintain strict control of hypertension with blood pressure goal below 130/90,  diabetes with hemoglobin A1c goal below 6.5% and cholesterol with LDL cholesterol (bad cholesterol) goal below 70 mg/dL. I also advised the patient to eat a healthy diet with plenty of whole grains, cereals, fruits and vegetables, exercise regularly and maintain ideal body weight.  Followup in the future with me in 12 months or  will call earlier if needed  Greater than 50% time during this 25 minute consultation visit was spent on counseling and coordination of care about DM, atrial fibrillation and HTN (risk factors), discussion about risk benefit of anticoagulation and answering questions.   Venancio Poisson, AGNP-BC  [facility] [address] [place], [place] [address]  Phone [phone] Fax [phone]

## 2018-02-08 NOTE — Progress Notes (Signed)
I have reviewed and agreed above plan. 

## 2018-02-20 ENCOUNTER — Telehealth: Payer: Self-pay | Admitting: Cardiology

## 2018-02-20 NOTE — Telephone Encounter (Signed)
Spoke w/ pt wife and requested that he send a manual transmission b/c his home monitor has not updated in at least 14 days.   

## 2018-02-21 ENCOUNTER — Other Ambulatory Visit: Payer: Self-pay | Admitting: Internal Medicine

## 2018-03-01 ENCOUNTER — Ambulatory Visit (INDEPENDENT_AMBULATORY_CARE_PROVIDER_SITE_OTHER): Payer: Medicare Other | Admitting: *Deleted

## 2018-03-01 DIAGNOSIS — I639 Cerebral infarction, unspecified: Secondary | ICD-10-CM | POA: Diagnosis not present

## 2018-03-05 NOTE — Progress Notes (Signed)
[facility] Summary Report / Loop Recorder 

## 2018-03-07 ENCOUNTER — Telehealth: Payer: Self-pay | Admitting: Cardiology

## 2018-03-07 NOTE — Telephone Encounter (Signed)
Spoke w/ pt and requested that he send a manual transmission b/c his home monitor has not updated in at least 14 days.   

## 2018-03-28 ENCOUNTER — Other Ambulatory Visit: Payer: Self-pay | Admitting: Internal Medicine

## 2018-04-03 ENCOUNTER — Ambulatory Visit (INDEPENDENT_AMBULATORY_CARE_PROVIDER_SITE_OTHER): Payer: Medicare Other | Admitting: *Deleted

## 2018-04-03 DIAGNOSIS — I639 Cerebral infarction, unspecified: Secondary | ICD-10-CM

## 2018-04-04 LAB — CUP PACEART REMOTE DEVICE CHECK
Date Time Interrogation Session: 20190412000519
MDC IDC PG IMPLANT DT: 20180907

## 2018-04-04 NOTE — Progress Notes (Signed)
[facility] Summary Report / Loop Recorder 

## 2018-04-05 ENCOUNTER — Telehealth: Payer: Self-pay | Admitting: Cardiology

## 2018-04-05 ENCOUNTER — Encounter: Payer: Self-pay | Admitting: Physical Medicine & Rehabilitation

## 2018-04-05 ENCOUNTER — Encounter: Payer: Medicare Other | Attending: Physical Medicine & Rehabilitation | Admitting: Physical Medicine & Rehabilitation

## 2018-04-05 ENCOUNTER — Other Ambulatory Visit: Payer: Self-pay

## 2018-04-05 VITALS — BP 134/88 | HR 77 | Ht 71.75 in | Wt 186.2 lb

## 2018-04-05 DIAGNOSIS — E785 Hyperlipidemia, unspecified: Secondary | ICD-10-CM | POA: Insufficient documentation

## 2018-04-05 DIAGNOSIS — Z8673 Personal history of transient ischemic attack (TIA), and cerebral infarction without residual deficits: Secondary | ICD-10-CM | POA: Diagnosis not present

## 2018-04-05 DIAGNOSIS — I6930 Unspecified sequelae of cerebral infarction: Secondary | ICD-10-CM

## 2018-04-05 DIAGNOSIS — N400 Enlarged prostate without lower urinary tract symptoms: Secondary | ICD-10-CM | POA: Diagnosis not present

## 2018-04-05 DIAGNOSIS — R269 Unspecified abnormalities of gait and mobility: Secondary | ICD-10-CM | POA: Insufficient documentation

## 2018-04-05 DIAGNOSIS — E119 Type 2 diabetes mellitus without complications: Secondary | ICD-10-CM | POA: Diagnosis not present

## 2018-04-05 DIAGNOSIS — I10 Essential (primary) hypertension: Secondary | ICD-10-CM | POA: Diagnosis not present

## 2018-04-05 NOTE — Telephone Encounter (Signed)
LMOVM requesting that pt send manual transmission b/c home monitor has not updated in at least 14 days.    

## 2018-04-05 NOTE — Progress Notes (Signed)
Subjective:    Patient ID: Earl [name], male    DOB: 01-Mar-1941, 77 y.o.   MRN: [id number]  HPI 77 y.o. male with history of  HTN, questionable history PAF, T2DM presents for follow up for cerebellar stroke.   Last clinic visit 10/06/17.  Since that time, pt has completed therapies. He continues to follow up with Neurology.  BP is controlled. Denies falls. Uses a cane in community.  Doing HEP.   Pain Inventory Average Pain 0 Pain Right Now 0 My pain is no pain  In the last 24 hours, has pain interfered with the following? General activity 0 Relation with others 0 Enjoyment of life 0 What TIME of day is your pain at its worst? no pain Sleep (in general) no pain  Pain is worse with: no pain Pain improves with: no pain Relief from Meds: no pain  Mobility walk with assistance use a cane ability to climb steps?  yes do you drive?  no Do you have any goals in this area?  no  Function retired I need assistance with the following:  . Do you have any goals in this area?  no  Neuro/Psych trouble walking dizziness  Prior Studies Any changes since last visit?  no hospital f/u  Physicians involved in your care Any changes since last visit?  no hospital f/u   Family History  Problem Relation Age of Onset  . Sickle cell anemia Mother   . Diabetes Father   . Stomach cancer Father   . Colon cancer Neg Hx   . CAD Neg Hx    Social History   Socioeconomic History  . Marital status: Married    Spouse name: Not on file  . Number of children: 2  . Years of education: Not on file  . Highest education level: Not on file  Occupational History  . Occupation: Herbalist: RETIRED  Social Needs  . Financial resource strain: Not on file  . Food insecurity:    Worry: Not on file    Inability: Not on file  . Transportation needs:    Medical: Not on file    Non-medical: Not on file  Tobacco Use  . Smoking status: Never Smoker    . Smokeless tobacco: Never Used  Substance and Sexual Activity  . Alcohol use: No  . Drug use: No  . Sexual activity: Not on file  Lifestyle  . Physical activity:    Days per week: Not on file    Minutes per session: Not on file  . Stress: Not on file  Relationships  . Social connections:    Talks on phone: Not on file    Gets together: Not on file    Attends religious service: Not on file    Active member of club or organization: Not on file    Attends meetings of clubs or organizations: Not on file    Relationship status: Not on file  Other Topics Concern  . Not on file  Social History Narrative  . Not on file   Past Surgical History:  Procedure Laterality Date  . [place] and 2004  . LOOP RECORDER INSERTION N/A 07/28/2017   Procedure: LOOP RECORDER INSERTION;  Surgeon: Deboraha Sprang, MD;  Location: [facility];  Service: Cardiovascular;  Laterality: N/A;  . TEE WITHOUT CARDIOVERSION N/A 07/28/2017   Procedure: TRANSESOPHAGEAL ECHOCARDIOGRAM (TEE);  Surgeon: Sueanne Margarita, MD;  Location: [facility];  Service: Cardiovascular;  Laterality: N/A;   Past Medical History:  Diagnosis Date  . BPH (benign prostatic hyperplasia)   . Diabetes mellitus ([place])   . Hyperlipidemia   . Hypertension   . Other testicular hypofunction   . Stroke ([place])   . Type II or unspecified type diabetes mellitus without mention of complication, not stated as uncontrolled    BP 134/88   Pulse 77   Ht 5' 11.75" (1.822 m) Comment: pt reported  Wt 186 lb 3.2 oz (84.5 kg)   SpO2 98%   BMI 25.43 kg/m   Opioid Risk Score:   Fall Risk Score:  `1  Depression screen PHQ 2/9  Depression screen [facility] 2/9 04/05/2018 12/21/2017 10/06/2017 09/18/2017 08/25/2017 04/15/2017 12/23/2016  Decreased Interest 0 0 0 0 0 0 0  Down, Depressed, Hopeless 0 0 0 0 0 0 0  PHQ - 2 Score 0 0 0 0 0 0 0  Altered sleeping - - - - 0 - -  Tired, decreased energy - - - - 1 - -  Change in appetite  - - - - 0 - -  Feeling bad or failure about yourself  - - - - 0 - -  Trouble concentrating - - - - 0 - -  Moving slowly or fidgety/restless - - - - 0 - -  Suicidal thoughts - - - - 0 - -  PHQ-9 Score - - - - 1 - -  Difficult doing work/chores - - - - Not difficult at all - -    Review of Systems  Constitutional: Negative.   HENT: Negative.   Eyes: Negative.   Respiratory: Negative.   Cardiovascular: Negative.   Gastrointestinal: Negative.   Endocrine: Negative.   Genitourinary: Negative.   Musculoskeletal: Positive for gait problem.  Skin: Negative.   Allergic/Immunologic: Negative.   Neurological: Positive for dizziness.  Hematological: Negative.   Psychiatric/Behavioral: Negative.   All other systems reviewed and are negative.     Objective:   Physical Exam Constitutional: He appears well-developed and well-nourished. No distress.  HENT: Normocephalic and atraumatic.  Eyes: EOM are normal. No discharge.  Cardiovascular: RRR. No JVD. Respiratory: Effort normal and breath sounds normal.  GI: Bowel sounds are normal. He exhibits no distension.  Musculoskeletal: Slow cadence with mild hemiplegic gait. Mild edema b/l. No tenderness.  Neurological: He is alert and oriented.  Speech clear.  No ataxia b/l UE Motor: RUE/RLE grossly 5/5 prox to distal. LUE/LLE 5/5.  Skin: Skin is warm and dry. He is not diaphoretic.  Psychiatric: He has a normal mood and affect. His behavior is normal. Judgment and thought content normal.     Assessment & Plan:  77 y.o. male with history of  HTN, questionable history PAF, T2DM presents for follow up for cerebellar stroke.   1.  Functional and mobility deficits secondary to right cerebellar infarct  Cont HEP/[facility]  Cont follow up with Neurology  2. Gait abnormality  Cont HEP/[facility]  Cont cane for safety

## 2018-04-12 ENCOUNTER — Other Ambulatory Visit: Payer: Self-pay | Admitting: Internal Medicine

## 2018-04-27 LAB — CUP PACEART REMOTE DEVICE CHECK
Date Time Interrogation Session: 20190515003913
MDC IDC PG IMPLANT DT: 20180907

## 2018-05-02 ENCOUNTER — Telehealth: Payer: Self-pay | Admitting: Cardiology

## 2018-05-02 NOTE — Telephone Encounter (Signed)
LMOVM requesting that pt send manual transmission b/c home monitor has not updated in at least 14 days.    

## 2018-05-07 ENCOUNTER — Ambulatory Visit (INDEPENDENT_AMBULATORY_CARE_PROVIDER_SITE_OTHER): Payer: Medicare Other | Admitting: *Deleted

## 2018-05-07 DIAGNOSIS — I639 Cerebral infarction, unspecified: Secondary | ICD-10-CM

## 2018-05-07 NOTE — Progress Notes (Signed)
[facility] Summary Report / Loop Recorder 

## 2018-05-10 ENCOUNTER — Ambulatory Visit (INDEPENDENT_AMBULATORY_CARE_PROVIDER_SITE_OTHER): Payer: Medicare Other | Admitting: Internal Medicine

## 2018-05-10 ENCOUNTER — Encounter: Payer: Self-pay | Admitting: Internal Medicine

## 2018-05-10 VITALS — BP 140/78 | HR 84 | Temp 97.0°F | Resp 18 | Ht 71.0 in | Wt 184.2 lb

## 2018-05-10 DIAGNOSIS — E782 Mixed hyperlipidemia: Secondary | ICD-10-CM

## 2018-05-10 DIAGNOSIS — I480 Paroxysmal atrial fibrillation: Secondary | ICD-10-CM | POA: Diagnosis not present

## 2018-05-10 DIAGNOSIS — E039 Hypothyroidism, unspecified: Secondary | ICD-10-CM | POA: Diagnosis not present

## 2018-05-10 DIAGNOSIS — N182 Chronic kidney disease, stage 2 (mild): Secondary | ICD-10-CM

## 2018-05-10 DIAGNOSIS — E1122 Type 2 diabetes mellitus with diabetic chronic kidney disease: Secondary | ICD-10-CM | POA: Diagnosis not present

## 2018-05-10 DIAGNOSIS — Z0001 Encounter for general adult medical examination with abnormal findings: Secondary | ICD-10-CM

## 2018-05-10 DIAGNOSIS — Z8249 Family history of ischemic heart disease and other diseases of the circulatory system: Secondary | ICD-10-CM | POA: Diagnosis not present

## 2018-05-10 DIAGNOSIS — N138 Other obstructive and reflux uropathy: Secondary | ICD-10-CM

## 2018-05-10 DIAGNOSIS — Z136 Encounter for screening for cardiovascular disorders: Secondary | ICD-10-CM | POA: Diagnosis not present

## 2018-05-10 DIAGNOSIS — I10 Essential (primary) hypertension: Secondary | ICD-10-CM | POA: Diagnosis not present

## 2018-05-10 DIAGNOSIS — I6930 Unspecified sequelae of cerebral infarction: Secondary | ICD-10-CM

## 2018-05-10 DIAGNOSIS — Z79899 Other long term (current) drug therapy: Secondary | ICD-10-CM | POA: Diagnosis not present

## 2018-05-10 DIAGNOSIS — E559 Vitamin D deficiency, unspecified: Secondary | ICD-10-CM

## 2018-05-10 DIAGNOSIS — Z1211 Encounter for screening for malignant neoplasm of colon: Secondary | ICD-10-CM | POA: Diagnosis not present

## 2018-05-10 DIAGNOSIS — N401 Enlarged prostate with lower urinary tract symptoms: Secondary | ICD-10-CM

## 2018-05-10 DIAGNOSIS — Z125 Encounter for screening for malignant neoplasm of prostate: Secondary | ICD-10-CM | POA: Diagnosis not present

## 2018-05-10 DIAGNOSIS — Z1212 Encounter for screening for malignant neoplasm of rectum: Secondary | ICD-10-CM | POA: Diagnosis not present

## 2018-05-10 NOTE — Patient Instructions (Signed)
We Do NOT Approve of  [facility] [place] Soliciting Our Patients  To Do Home Visits  & We Do NOT Approve of [facility] > > > > > > > > > > > > > > > > > > > > > > > > > > > > > > > > > > >  > > > >   Preventive Care for Adults  A healthy lifestyle and preventive care can promote health and wellness. Preventive health guidelines for men include the following key practices:  A routine yearly physical is a good way to check with your health care provider about your health and preventative screening. It is a chance to share any concerns and updates on your health and to receive a thorough exam.  Visit your dentist for a routine exam and preventative care every 6 months. Brush your teeth twice a day and floss once a day. Good oral hygiene prevents tooth decay and gum disease.  The frequency of eye exams is based on your age, health, family medical history, use of contact lenses, and other factors. Follow your health care provider's recommendations for frequency of eye exams.  Eat a healthy diet. Foods such as vegetables, fruits, whole grains, low-fat dairy products, and lean protein foods contain the nutrients you need without too many calories. Decrease your intake of foods high in solid fats, added sugars, and salt. Eat the right amount of calories for you. Get information about a proper diet from your health care provider, if necessary.  Regular physical exercise is one of the most important things you can do for your health. Most adults should get at least 150 minutes of moderate-intensity exercise (any activity that increases your heart rate and causes you to sweat) each week. In addition, most adults need muscle-strengthening exercises on 2 or more days a week.  Maintain a healthy weight. The body mass index (BMI) is a screening tool to identify possible weight problems. It provides an estimate of body fat based on height and weight. Your health care provider can find  your BMI and can help you achieve or maintain a healthy weight. For adults 20 years and older:  A BMI below 18.5 is considered underweight.  A BMI of 18.5 to 24.9 is normal.  A BMI of 25 to 29.9 is considered overweight.  A BMI of 30 and above is considered obese.  Maintain normal blood lipids and cholesterol levels by exercising and minimizing your intake of saturated fat. Eat a balanced diet with plenty of fruit and vegetables. Blood tests for lipids and cholesterol should begin at age 20 and be repeated every 5 years. If your lipid or cholesterol levels are high, you are over 50, or you are at high risk for heart disease, you may need your cholesterol levels checked more frequently. Ongoing high lipid and cholesterol levels should be treated with medicines if diet and exercise are not working.  If you smoke, find out from your health care provider how to quit. If you do not use tobacco, do not start.  Lung cancer screening is recommended for adults aged 55-80 years who are at high risk for developing lung cancer because of a history of smoking. A yearly low-dose CT scan of the lungs is recommended for people who have at least a 30-pack-year history of smoking and are a current smoker or have quit within the past 15 years. A pack year of smoking is smoking an average of 1 pack   of cigarettes a day for 1 year (for example: 1 pack a day for 30 years or 2 packs a day for 15 years). Yearly screening should continue until the smoker has stopped smoking for at least 15 years. Yearly screening should be stopped for people who develop a health problem that would prevent them from having lung cancer treatment.  If you choose to drink alcohol, do not have more than 2 drinks per day. One drink is considered to be 12 ounces (355 mL) of beer, 5 ounces (148 mL) of wine, or 1.5 ounces (44 mL) of liquor.  Avoid use of street drugs. Do not share needles with anyone. Ask for help if you need support or instructions  about stopping the use of drugs.  High blood pressure causes heart disease and increases the risk of stroke. Your blood pressure should be checked at least every 1-2 years. Ongoing high blood pressure should be treated with medicines, if weight loss and exercise are not effective.  If you are 45-79 years old, ask your health care provider if you should take aspirin to prevent heart disease.  Diabetes screening involves taking a blood sample to check your fasting blood sugar level. Testing should be considered at a younger age or be carried out more frequently if you are overweight and have at least 1 risk factor for diabetes.  Colorectal cancer can be detected and often prevented. Most routine colorectal cancer screening begins at the age of 50 and continues through age 75. However, your health care provider may recommend screening at an earlier age if you have risk factors for colon cancer. On a yearly basis, your health care provider may provide home test kits to check for hidden blood in the stool. Use of a small camera at the end of a tube to directly examine the colon (sigmoidoscopy or colonoscopy) can detect the earliest forms of colorectal cancer. Talk to your health care provider about this at age 50, when routine screening begins. Direct exam of the colon should be repeated every 5-10 years through age 75, unless early forms of precancerous polyps or small growths are found.  Hepatitis C blood testing is recommended for all people born from 1945 through 1965 and any individual with known risks for hepatitis C.  Screening for abdominal aortic aneurysm (AAA)  by ultrasound is recommended for people who have history of high blood pressure or who are current or former smokers.  Healthy men should  receive prostate-specific antigen (PSA) blood tests as part of routine cancer screening. Talk with your health care provider about prostate cancer screening.  Testicular cancer screening is   recommended for adult males. Screening includes self-exam, a health care provider exam, and other screening tests. Consult with your health care provider about any symptoms you have or any concerns you have about testicular cancer.  Use sunscreen. Apply sunscreen liberally and repeatedly throughout the day. You should seek shade when your shadow is shorter than you. Protect yourself by wearing long sleeves, pants, a wide-brimmed hat, and sunglasses year round, whenever you are outdoors.  Once a month, do a whole-body skin exam, using a mirror to look at the skin on your back. Tell your health care provider about new moles, moles that have irregular borders, moles that are larger than a pencil eraser, or moles that have changed in shape or color.  Stay current with required vaccines (immunizations).  Influenza vaccine. All adults should be immunized every year.  Tetanus, diphtheria, and acellular pertussis (  Td, Tdap) vaccine. An adult who has not previously received Tdap or who does not know his vaccine status should receive 1 dose of Tdap. This initial dose should be followed by tetanus and diphtheria toxoids (Td) booster doses every 10 years. Adults with an unknown or incomplete history of completing a 3-dose immunization series with Td-containing vaccines should begin or complete a primary immunization series including a Tdap dose. Adults should receive a Td booster every 10 years.  Zoster vaccine. One dose is recommended for adults aged 60 years or older unless certain conditions are present.    PREVNAR - Pneumococcal 13-valent conjugate (PCV13) vaccine. When indicated, a person who is uncertain of his immunization history and has no record of immunization should receive the PCV13 vaccine. An adult aged 19 years or older who has certain medical conditions and has not been previously immunized should receive 1 dose of PCV13 vaccine. This PCV13 should be followed with a dose of pneumococcal  polysaccharide (PPSV23) vaccine. The PPSV23 vaccine dose should be obtained 1 or more year(s)after the dose of PCV13 vaccine. An adult aged 19 years or older who has certain medical conditions and previously received 1 or more doses of PPSV23 vaccine should receive 1 dose of PCV13. The PCV13 vaccine dose should be obtained 1 or more years after the last PPSV23 vaccine dose.    PNEUMOVAX - Pneumococcal polysaccharide (PPSV23) vaccine. When PCV13 is also indicated, PCV13 should be obtained first. All adults aged 65 years and older should be immunized. An adult younger than age 65 years who has certain medical conditions should be immunized. Any person who resides in a nursing home or long-term care facility should be immunized. An adult smoker should be immunized. People with an immunocompromised condition and certain other conditions should receive both PCV13 and PPSV23 vaccines. People with human immunodeficiency virus (HIV) infection should be immunized as soon as possible after diagnosis. Immunization during chemotherapy or radiation therapy should be avoided. Routine use of PPSV23 vaccine is not recommended for American Indians, [place] Natives, or people younger than 65 years unless there are medical conditions that require PPSV23 vaccine. When indicated, people who have unknown immunization and have no record of immunization should receive PPSV23 vaccine. One-time revaccination 5 years after the first dose of PPSV23 is recommended for people aged 19-64 years who have chronic kidney failure, nephrotic syndrome, asplenia, or immunocompromised conditions. People who received 1-2 doses of PPSV23 before age 65 years should receive another dose of PPSV23 vaccine at age 65 years or later if at least 5 years have passed since the previous dose. Doses of PPSV23 are not needed for people immunized with PPSV23 at or after age 65 years.    Hepatitis A vaccine. Adults who wish to be protected from this disease, have  certain high-risk conditions, work with hepatitis A-infected animals, work in hepatitis A research labs, or travel to or work in countries with a high rate of hepatitis A should be immunized. Adults who were previously unvaccinated and who anticipate close contact with an international adoptee during the first 60 days after arrival in the [place] from a country with a high rate of hepatitis A should be immunized.    Hepatitis B vaccine. Adults should be immunized if they wish to be protected from this disease, have certain high-risk conditions, may be exposed to blood or other infectious body fluids, are household contacts or sex partners of hepatitis B positive people, are clients or workers in certain care facilities, or   travel to or work in countries with a high rate of hepatitis B.   Preventive Service / Frequency   Ages 65 and over  Blood pressure check.  Lipid and cholesterol check.  Lung cancer screening. / Every year if you are aged 55-80 years and have a 30-pack-year history of smoking and currently smoke or have quit within the past 15 years. Yearly screening is stopped once you have quit smoking for at least 15 years or develop a health problem that would prevent you from having lung cancer treatment.  Fecal occult blood test (FOBT) of stool. You may not have to do this test if you get a colonoscopy every 10 years.  Flexible sigmoidoscopy** or colonoscopy.** / Every 5 years for a flexible sigmoidoscopy or every 10 years for a colonoscopy beginning at age 50 and continuing until age 75.  Hepatitis C blood test.** / For all people born from 1945 through 1965 and any individual with known risks for hepatitis C.  Abdominal aortic aneurysm (AAA) screening./ Screening current or former smokers or have Hypertension.  Skin self-exam. / Monthly.  Influenza vaccine. / Every year.  Tetanus, diphtheria, and acellular pertussis (Tdap/Td) vaccine.** / 1 dose of Td every 10  years.   Zoster vaccine.** / 1 dose for adults aged 60 years or older.         Pneumococcal 13-valent conjugate (PCV13) vaccine.    Pneumococcal polysaccharide (PPSV23) vaccine.     Hepatitis A vaccine.** / Consult your health care provider.  Hepatitis B vaccine.** / Consult your health care provider. Screening for abdominal aortic aneurysm (AAA)  by ultrasound is recommended for people who have history of high blood pressure or who are current or former smokers. ++++++++++ Recommend Adult Low Dose Aspirin or  coated  Aspirin 81 mg daily  To reduce risk of Colon Cancer 20 %,  Skin Cancer 26 % ,  Malignant Melanoma 46%  and  Pancreatic cancer 60% ++++++++++++++++++++++ Vitamin D goal  is between 70-100.  Please make sure that you are taking your Vitamin D as directed.  It is very important as a natural anti-inflammatory  helping hair, skin, and nails, as well as reducing stroke and heart attack risk.  It helps your bones and helps with mood. It also decreases numerous cancer risks so please take it as directed.  Low Vit D is associated with a 200-300% higher risk for CANCER  and 200-300% higher risk for HEART   ATTACK  &  STROKE.   ...................................... It is also associated with higher death rate at younger ages,  autoimmune diseases like Rheumatoid arthritis, Lupus, Multiple Sclerosis.    Also many other serious conditions, like depression, Alzheimer's Dementia, infertility, muscle aches, fatigue, fibromyalgia - just to name a few. ++++++++++++++++++++++ Recommend the book "The END of DIETING" by Dr Joel Fuhrman  & the book "The END of DIABETES " by Dr Joel Fuhrman At [employer] - get book & Audio CD's    Being diabetic has a  300% increased risk for heart attack, stroke, cancer, and alzheimer- type vascular dementia. It is very important that you work harder with diet by avoiding all foods that are white. Avoid white rice (brown & wild rice is OK), white  potatoes (sweetpotatoes in moderation is OK), White bread or wheat bread or anything made out of white flour like bagels, donuts, rolls, buns, biscuits, cakes, pastries, cookies, pizza crust, and pasta (made from white flour & egg whites) - vegetarian pasta or spinach or wheat   pasta is OK. Multigrain breads like Arnold's or [employer], or multigrain sandwich thins or flatbreads.  Diet, exercise and weight loss can reverse and cure diabetes in the early stages.  Diet, exercise and weight loss is very important in the control and prevention of complications of diabetes which affects every system in your body, ie. Brain - dementia/stroke, eyes - glaucoma/blindness, heart - heart attack/heart failure, kidneys - dialysis, stomach - gastric paralysis, intestines - malabsorption, nerves - severe painful neuritis, circulation - gangrene & loss of a leg(s), and finally cancer and Alzheimers.    I recommend avoid fried & greasy foods,  sweets/candy, white rice (brown or wild rice or Quinoa is OK), white potatoes (sweet potatoes are OK) - anything made from white flour - bagels, doughnuts, rolls, buns, biscuits,white and wheat breads, pizza crust and traditional pasta made of white flour & egg white(vegetarian pasta or spinach or wheat pasta is OK).  Multi-grain bread is OK - like multi-grain flat bread or sandwich thins. Avoid alcohol in excess. Exercise is also important.    Eat all the vegetables you want - avoid meat, especially red meat and dairy - especially cheese.  Cheese is the most concentrated form of trans-fats which is the worst thing to clog up our arteries. Veggie cheese is OK which can be found in the fresh produce section at [employer] or [employer] or [employer]  ++++++++++++++++++++++ DASH Eating Plan  DASH stands for "Dietary Approaches to Stop Hypertension."   The DASH eating plan is a healthy eating plan that has been shown to reduce high blood pressure (hypertension). Additional health  benefits may include reducing the risk of type 2 diabetes mellitus, heart disease, and stroke. The DASH eating plan may also help with weight loss. WHAT DO I NEED TO KNOW ABOUT THE DASH EATING PLAN? For the DASH eating plan, you will follow these general guidelines:  Choose foods with a percent daily value for sodium of less than 5% (as listed on the food label).  Use salt-free seasonings or herbs instead of table salt or sea salt.  Check with your health care provider or pharmacist before using salt substitutes.  Eat lower-sodium products, often labeled as "lower sodium" or "no salt added."  Eat fresh foods.  Eat more vegetables, fruits, and low-fat dairy products.  Choose whole grains. Look for the word "whole" as the first word in the ingredient list.  Choose fish   Limit sweets, desserts, sugars, and sugary drinks.  Choose heart-healthy fats.  Eat veggie cheese   Eat more home-cooked food and less restaurant, buffet, and fast food.  Limit fried foods.  Cook foods using methods other than frying.  Limit canned vegetables. If you do use them, rinse them well to decrease the sodium.  When eating at a restaurant, ask that your food be prepared with less salt, or no salt if possible.                      WHAT FOODS CAN I EAT? Read Dr Joel Fuhrman's books on The End of Dieting & The End of Diabetes  Grains Whole grain or whole wheat bread. Brown rice. Whole grain or whole wheat pasta. Quinoa, bulgur, and whole grain cereals. Low-sodium cereals. Corn or whole wheat flour tortillas. Whole grain cornbread. Whole grain crackers. Low-sodium crackers.  Vegetables Fresh or frozen vegetables (raw, steamed, roasted, or grilled). Low-sodium or reduced-sodium tomato and vegetable juices. Low-sodium or reduced-sodium tomato sauce and paste. Low-sodium or   reduced-sodium canned vegetables.   Fruits All fresh, canned (in natural juice), or frozen fruits.  Protein Products  All fish  and seafood.  Dried beans, peas, or lentils. Unsalted nuts and seeds. Unsalted canned beans.  Dairy Low-fat dairy products, such as skim or 1% milk, 2% or reduced-fat cheeses, low-fat ricotta or cottage cheese, or plain low-fat yogurt. Low-sodium or reduced-sodium cheeses.  Fats and Oils Tub margarines without trans fats. Light or reduced-fat mayonnaise and salad dressings (reduced sodium). Avocado. Safflower, olive, or canola oils. Natural peanut or almond butter.  Other Unsalted popcorn and pretzels. The items listed above may not be a complete list of recommended foods or beverages. Contact your dietitian for more options.  ++++++++++++++++++++  WHAT FOODS ARE NOT RECOMMENDED? Grains/ White flour or wheat flour White bread. White pasta. White rice. Refined cornbread. Bagels and croissants. Crackers that contain trans fat.  Vegetables  Creamed or fried vegetables. Vegetables in a . Regular canned vegetables. Regular canned tomato sauce and paste. Regular tomato and vegetable juices.  Fruits Dried fruits. Canned fruit in light or heavy syrup. Fruit juice.  Meat and Other Protein Products Meat in general - RED meat & White meat.  Fatty cuts of meat. Ribs, chicken wings, all processed meats as bacon, sausage, bologna, salami, fatback, hot dogs, bratwurst and packaged luncheon meats.  Dairy Whole or 2% milk, cream, half-and-half, and cream cheese. Whole-fat or sweetened yogurt. Full-fat cheeses or blue cheese. Non-dairy creamers and whipped toppings. Processed cheese, cheese spreads, or cheese curds.  Condiments Onion and garlic salt, seasoned salt, table salt, and sea salt. Canned and packaged gravies. Worcestershire sauce. Tartar sauce. Barbecue sauce. Teriyaki sauce. Soy sauce, including reduced sodium. Steak sauce. Fish sauce. Oyster sauce. Cocktail sauce. Horseradish. Ketchup and mustard. Meat flavorings and tenderizers. Bouillon cubes. Hot sauce. Tabasco sauce. Marinades. Taco  seasonings. Relishes.  Fats and Oils Butter, stick margarine, lard, shortening and bacon fat. Coconut, palm kernel, or palm oils. Regular salad dressings.  Pickles and olives. Salted popcorn and pretzels.  The items listed above may not be a complete list of foods and beverages to avoid.    

## 2018-05-10 NOTE — Progress Notes (Addendum)
[facility]   [name], M.D.     Earl [name]. Silverio Lay, P.A.-C Liane Comber, [place]                [place], [place] [address] Telephone [phone] Telefax [phone] Annual  Screening/Preventative Visit  & Comprehensive Evaluation & Examination     This very nice 77 y.o. MBM presents for a Screening/Preventative Visit & comprehensive evaluation and management of multiple medical co-morbidities.  Patient has been followed for HTN, HLD, T2_NIDDM and Vitamin D Deficiency.     HTN predates since 55. Patient's BP has been controlled at home.  Today's BP is at goal - 140/78. Patient denies any cardiac symptoms as chest pain, palpitations, shortness of breath, dizziness or ankle swelling. 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      Patient's hyperlipidemia is controlled with diet and medications. Patient denies myalgias or other medication SE's. Current lipids are at goal: Lab Results  Component Value Date   CHOL 109 05/10/2018   HDL 49 05/10/2018   LDLCALC 46 05/10/2018   TRIG 49 05/10/2018   CHOLHDL 2.2 05/10/2018      Patient has T2_NIDDM (1994) and patient denies reactive hypoglycemic symptoms, visual blurring, diabetic polys or paresthesias. Generally his Diabetes has been well controlled over the years. He has been reticent to monitor CBG's and continues with DU monitoring.  CurrentA1c is not at goal: Lab Results  Component Value Date   HGBA1C 6.7 (H) 05/10/2018       Patient was begun on Thyroid Replacement in 2014.     Finally, patient has history of Vitamin D Deficiency and Current vitamin D was at goal: Lab Results  Component Value Date   VD25OH 64 05/10/2018   Current Outpatient Medications on File Prior to Visit  Medication Sig  . atorvastatin (LIPITOR) 40 MG tablet TAKE 1 TABLET BY MOUTH  DAILY AT 6 PM.  .  Cholecalciferol (VITAMIN D PO) Take 5,000 Units by mouth daily.  . Cyanocobalamin (B-12) 50 MCG TABS Take 50 mcg by mouth daily.  [name] ELIQUIS 5 MG TABS tablet TAKE 1 TABLET BY MOUTH TWO  TIMES DAILY  . finasteride (PROSCAR) 5 MG tablet TAKE 1 TABLET BY MOUTH  EVERY DAY FOR PROSTATE  . glipiZIDE (GLUCOTROL) 5 MG tablet TAKE 1 TABLET BY MOUTH 2  TIMES DAILY BEFORE A MEAL.  [name] levothyroxine (SYNTHROID, LEVOTHROID) 50 MCG tablet TAKE 1 TABLET BY MOUTH  DAILY BEFORE BREAKFAST  . lisinopril (PRINIVIL,ZESTRIL) 5 MG tablet TAKE 1 TABLET BY MOUTH  DAILY  . Magnesium 250 MG TABS Take 250 mg by mouth daily.  . metFORMIN (GLUCOPHAGE-XR) 500 MG 24 hr tablet TAKE 1 TABLET BY MOUTH 4  TIMES DAILY FOR DIABETES  . Multiple Vitamin (MULTIVITAMIN) capsule Take 1 capsule by mouth daily.    . vitamin C (ASCORBIC ACID) 500 MG tablet Take 500 mg by mouth daily.     No current facility-administered medications on file prior to visit.    Allergies  Allergen Reactions  . Penicillins Other (See Comments)   Past Medical History:  Diagnosis Date  . BPH (benign prostatic hyperplasia)   . Diabetes mellitus ([place])   . Hyperlipidemia   . Hypertension   . Other testicular hypofunction   . Stroke ([place])   . Type II  or unspecified type diabetes mellitus without mention of complication, not stated as uncontrolled    Health Maintenance  Topic Date Due  . OPHTHALMOLOGY EXAM  09/01/2017  . COLONOSCOPY  09/07/2017  . INFLUENZA VACCINE  06/21/2018  . HEMOGLOBIN A1C  11/09/2018  . FOOT EXAM  05/11/2019  . TETANUS/TDAP  02/08/2025  . PNA vac Low Risk Adult  Completed   Immunization History  Administered Date(s) Administered  . DT 02/09/2015  . Influenza Split 08/07/2013  . Influenza, High Dose Seasonal PF 08/19/2014, 08/28/2015, 09/13/2016, 08/15/2017  . Pneumococcal Conjugate-13 06/10/2016  . Pneumococcal Polysaccharide-23 08/13/2008  . Td 11/22/2003   Last Colon -  09/07/2012 - Dr Deatra Ina - recc 5 yr f/u in Oct -  deferred due to recent CVA  Past Surgical History:  Procedure Laterality Date  . [place] and 2004  . LOOP RECORDER INSERTION N/A 07/28/2017   Procedure: LOOP RECORDER INSERTION;  Surgeon: Deboraha Sprang, MD;  Location: [facility];  Service: Cardiovascular;  Laterality: N/A;  . TEE WITHOUT CARDIOVERSION N/A 07/28/2017   Procedure: TRANSESOPHAGEAL ECHOCARDIOGRAM (TEE);  Surgeon: Sueanne Margarita, MD;  Location: [facility];  Service: Cardiovascular;  Laterality: N/A;   Family History  Problem Relation Age of Onset  . Sickle cell anemia Mother   . Diabetes Father   . Stomach cancer Father   . Colon cancer Neg Hx   . CAD Neg Hx    Social History   Socioeconomic History  . Marital status: Married    Spouse name: [name]  . Number of children: 2  Occupational History  . Occupation: Herbalist: RETIRED  Tobacco Use  . Smoking status: Never Smoker  . Smokeless tobacco: Never Used  Substance and Sexual Activity  . Alcohol use: No  . Drug use: No  . Sexual activity: Not on file    ROS Constitutional: Denies fever, chills, weight loss/gain, headaches, insomnia,  night sweats or change in appetite. Does c/o fatigue. Eyes: Denies redness, blurred vision, diplopia, discharge, itchy or watery eyes.  ENT: Denies discharge, congestion, post nasal drip, epistaxis, sore throat, earache, hearing loss, dental pain, Tinnitus, Vertigo, Sinus pain or snoring.  Cardio: Denies chest pain, palpitations, irregular heartbeat, syncope, dyspnea, diaphoresis, orthopnea, PND, claudication or edema Respiratory: denies cough, dyspnea, DOE, pleurisy, hoarseness, laryngitis or wheezing.  Gastrointestinal: Denies dysphagia, heartburn, reflux, water brash, pain, cramps, nausea, vomiting, bloating, diarrhea, constipation, hematemesis, melena, hematochezia, jaundice or hemorrhoids Genitourinary: Denies dysuria, frequency,  discharge, hematuria or  flank pain. Reports occas urgency, nocturia x 2-3  & mild hesitancy. Musculoskeletal: Denies arthralgia, myalgia, stiffness, Jt. Swelling, pain, limp or strain/sprain. Denies Falls. Skin: Denies puritis, rash, hives, warts, acne, eczema or change in skin lesion Neuro: No weakness, tremor, incoordination, spasms, paresthesia or pain Psychiatric: Denies confusion, memory loss or sensory loss. Denies Depression. Endocrine: Denies change in weight, skin, hair change, nocturia, and paresthesia, diabetic polys, visual blurring or hyper / hypo glycemic episodes.  Heme/Lymph: No excessive bleeding, bruising or enlarged lymph nodes.  Physical Exam  BP 140/78   Pulse 84   Temp (!) 97 F (36.1 C)   Resp 18   Ht 5\' 11"  (1.803 m)   Wt 184 lb 3.2 oz (83.6 kg)   BMI 25.69 kg/m   General Appearance: Well nourished and well groomed and in no apparent distress.  Eyes: PERRLA, EOMs, conjunctiva no swelling or erythema, normal fundi and vessels. Sinuses: No  frontal/maxillary tenderness ENT/Mouth: EACs patent / TMs  nl. Nares clear without erythema, swelling, mucoid exudates. Oral hygiene is good. No erythema, swelling, or exudate. Tongue normal, non-obstructing. Tonsils not swollen or erythematous. Hearing normal.  Neck: Supple, thyroid not palpable. No bruits, nodes or JVD. Respiratory: Respiratory effort normal.  BS equal and clear bilateral without rales, rhonci, wheezing or stridor. Cardio: Heart sounds are normal with regular rate and rhythm and no murmurs, rubs or gallops. Peripheral pulses are normal and equal bilaterally without edema. No aortic or femoral bruits. Chest: symmetric with normal excursions and percussion.  Abdomen: Soft, with Nl bowel sounds. Nontender, no guarding, rebound, hernias, masses, or organomegaly.  Lymphatics: Non tender without lymphadenopathy.  Genitourinary: No hernias.Testes nl. DRE - prostate nl for age - smooth & firm w/o nodules. Musculoskeletal: Full ROM all  peripheral extremities, joint stability, 5/5 strength, and normal gait. Skin: Warm and dry without rashes, lesions, cyanosis, clubbing or  ecchymosis.  Neuro: Cranial nerves intact, reflexes equal bilaterally. Normal muscle tone, no cerebellar symptoms. Sensation intact to touch, vibratory and Monofilament to the toes bilaterally. Pysch: Alert and oriented X 3 with normal affect, insight and judgment appropriate.   Assessment and Plan  1. Annual Preventative/Screening Exam   2. Essential hypertension  - EKG 12-Lead - [place], RETROPERITNL ABD,  LTD - Microalbumin / creatinine urine ratio - POC Hemoccult Bld/Stl  - CBC with Differential/Platelet - COMPLETE METABOLIC PANEL WITH GFR - Magnesium - TSH  3. Hyperlipidemia, mixed  - EKG 12-Lead - [place], RETROPERITNL ABD,  LTD - Lipid panel - TSH  4. Type 2 diabetes mellitus with stage 2 chronic kidney disease, without long-term current use of insulin (HCC)  - EKG 12-Lead - [place], RETROPERITNL ABD,  LTD - Microalbumin / creatinine urine ratio  - HM DIABETES FOOT EXAM - LOW EXTREMITY NEUR EXAM DOCUM - Hemoglobin A1c - Insulin, random  5. Vitamin D deficiency  - VITAMIN D 25 Hydroxyl  6. Hypothyroidism  - TSH  7. Paroxysmal atrial fibrillation (HCC)  - EKG 12-Lead  8. History of CVA with residual deficit   9. Screening for colorectal cancer   10. Screening for ischemic heart disease  - EKG 12-Lead  11. FHx: heart disease  - EKG 12-Lead - [place], RETROPERITNL ABD,  LTD  12. Screening for AAA (aortic abdominal aneurysm)  - [place], RETROPERITNL ABD,  LTD  13. BPH with obstruction/lower urinary tract symptoms  - PSA  14. Prostate cancer screening  - PSA  15. Medication management  - Urinalysis, Routine w reflex microscopic - Microalbumin / creatinine urine ratio - CBC with Differential/Platelet - COMPLETE METABOLIC PANEL WITH GFR - Magnesium - Lipid panel - TSH - Hemoglobin A1c - Insulin, random - VITAMIN D 25  Hydroxyl      Patient was counseled in prudent diet, weight control to achieve/maintain BMI less than 25, BP monitoring, regular exercise and medications as discussed.  Discussed med effects and SE's. Routine screening labs and tests as requested with regular follow-up as recommended. Over 40 minutes of exam, counseling, chart review and high complex critical decision making was performed

## 2018-05-11 LAB — COMPLETE METABOLIC PANEL WITH GFR
AG RATIO: 1.7 (calc) (ref 1.0–2.5)
ALBUMIN MSPROF: 3.8 g/dL (ref 3.6–5.1)
ALKALINE PHOSPHATASE (APISO): 78 U/L (ref 40–115)
ALT: 26 U/L (ref 9–46)
AST: 20 U/L (ref 10–35)
BUN: 16 mg/dL (ref 7–25)
CALCIUM: 9.4 mg/dL (ref 8.6–10.3)
CO2: 29 mmol/L (ref 20–32)
CREATININE: 1.12 mg/dL (ref 0.70–1.18)
Chloride: 106 mmol/L (ref 98–110)
GFR, EST NON AFRICAN AMERICAN: 63 mL/min/{1.73_m2} (ref >=60)
GFR, Est African American: 73 mL/min/{1.73_m2} (ref >=60)
GLOBULIN: 2.2 g/dL (ref 1.9–3.7)
Glucose, Bld: 175 mg/dL — ABNORMAL HIGH (ref 65–99)
POTASSIUM: 4.5 mmol/L (ref 3.5–5.3)
SODIUM: 141 mmol/L (ref 135–146)
Total Bilirubin: 0.8 mg/dL (ref 0.2–1.2)
Total Protein: 6 g/dL — ABNORMAL LOW (ref 6.1–8.1)

## 2018-05-11 LAB — TSH: TSH: 0.83 m[IU]/L (ref 0.40–4.50)

## 2018-05-11 LAB — URINALYSIS, ROUTINE W REFLEX MICROSCOPIC
Bilirubin Urine: NEGATIVE
Glucose, UA: NEGATIVE
Hgb urine dipstick: NEGATIVE
KETONES UR: NEGATIVE
Leukocytes, UA: NEGATIVE
NITRITE: NEGATIVE
Protein, ur: NEGATIVE
Specific Gravity, Urine: 1.014 (ref 1.001–1.03)
pH: 5.5 (ref 5.0–8.0)

## 2018-05-11 LAB — MICROALBUMIN / CREATININE URINE RATIO
CREATININE, URINE: 83 mg/dL (ref 20–320)
MICROALB UR: 3.6 mg/dL
Microalb Creat Ratio: 43 mcg/mg creat — ABNORMAL HIGH (ref <30)

## 2018-05-11 LAB — CBC WITH DIFFERENTIAL/PLATELET
BASOS ABS: 38 {cells}/uL (ref 0–200)
Basophils Relative: 0.8 %
EOS PCT: 1 %
Eosinophils Absolute: 48 cells/uL (ref 15–500)
HCT: 38.5 % (ref 38.5–50.0)
HEMOGLOBIN: 13.4 g/dL (ref 13.2–17.1)
LYMPHS ABS: 2136 {cells}/uL (ref 850–3900)
MCH: 29.3 pg (ref 27.0–33.0)
MCHC: 34.8 g/dL (ref 32.0–36.0)
MCV: 84.2 fL (ref 80.0–100.0)
MPV: 10.3 fL (ref 7.5–12.5)
Monocytes Relative: 10.6 %
NEUTROS ABS: 2069 {cells}/uL (ref 1500–7800)
NEUTROS PCT: 43.1 %
Platelets: 197 10*3/uL (ref 140–400)
RBC: 4.57 10*6/uL (ref 4.20–5.80)
RDW: 12.8 % (ref 11.0–15.0)
Total Lymphocyte: 44.5 %
WBC mixed population: 509 cells/uL (ref 200–950)
WBC: 4.8 10*3/uL (ref 3.8–10.8)

## 2018-05-11 LAB — LIPID PANEL
CHOL/HDL RATIO: 2.2 (calc) (ref <5.0)
Cholesterol: 109 mg/dL (ref <200)
HDL: 49 mg/dL (ref >40)
LDL CHOLESTEROL (CALC): 46 mg/dL
Non-HDL Cholesterol (Calc): 60 mg/dL (calc) (ref <130)
Triglycerides: 49 mg/dL (ref <150)

## 2018-05-11 LAB — INSULIN, RANDOM: Insulin: 19 u[IU]/mL (ref 2.0–19.6)

## 2018-05-11 LAB — MAGNESIUM: Magnesium: 1.8 mg/dL (ref 1.5–2.5)

## 2018-05-11 LAB — VITAMIN D 25 HYDROXY (VIT D DEFICIENCY, FRACTURES): Vit D, 25-Hydroxy: 64 ng/mL (ref 30–100)

## 2018-05-11 LAB — HEMOGLOBIN A1C
HEMOGLOBIN A1C: 6.7 %{Hb} — AB (ref <5.7)
Mean Plasma Glucose: 146 (calc)
eAG (mmol/L): 8.1 (calc)

## 2018-05-11 LAB — PSA: PSA: 0.3 ng/mL (ref <=4.0)

## 2018-05-13 ENCOUNTER — Encounter: Payer: Self-pay | Admitting: Internal Medicine

## 2018-05-16 ENCOUNTER — Other Ambulatory Visit: Payer: Self-pay

## 2018-05-16 DIAGNOSIS — Z1212 Encounter for screening for malignant neoplasm of rectum: Secondary | ICD-10-CM | POA: Diagnosis not present

## 2018-05-16 DIAGNOSIS — Z1211 Encounter for screening for malignant neoplasm of colon: Secondary | ICD-10-CM

## 2018-05-16 LAB — POC HEMOCCULT BLD/STL (HOME/3-CARD/SCREEN)
Card #3 Fecal Occult Blood, POC: NEGATIVE
FECAL OCCULT BLD: NEGATIVE
FECAL OCCULT BLD: NEGATIVE

## 2018-05-22 ENCOUNTER — Telehealth: Payer: Self-pay | Admitting: Cardiology

## 2018-05-22 NOTE — Telephone Encounter (Signed)
Spoke w/ pt and requested that he send a manual transmission b/c his home monitor has not updated in at least 14 days.   

## 2018-06-08 ENCOUNTER — Ambulatory Visit (INDEPENDENT_AMBULATORY_CARE_PROVIDER_SITE_OTHER): Payer: Medicare Other | Admitting: *Deleted

## 2018-06-08 DIAGNOSIS — I639 Cerebral infarction, unspecified: Secondary | ICD-10-CM | POA: Diagnosis not present

## 2018-06-09 LAB — CUP PACEART REMOTE DEVICE CHECK
Implantable Pulse Generator Implant Date: 20180907
MDC IDC SESS DTM: 20190617003521

## 2018-06-11 NOTE — Progress Notes (Signed)
[facility] Summary Report / Loop Recorder 

## 2018-06-26 ENCOUNTER — Encounter: Payer: Self-pay | Admitting: Gastroenterology

## 2018-06-27 ENCOUNTER — Other Ambulatory Visit: Payer: Self-pay | Admitting: Adult Health

## 2018-07-11 ENCOUNTER — Ambulatory Visit (INDEPENDENT_AMBULATORY_CARE_PROVIDER_SITE_OTHER): Payer: Medicare Other | Admitting: *Deleted

## 2018-07-11 DIAGNOSIS — I639 Cerebral infarction, unspecified: Secondary | ICD-10-CM | POA: Diagnosis not present

## 2018-07-12 NOTE — Progress Notes (Signed)
[facility] Summary Report / Loop Recorder 

## 2018-07-19 ENCOUNTER — Encounter: Payer: Self-pay | Admitting: Internal Medicine

## 2018-07-24 ENCOUNTER — Other Ambulatory Visit: Payer: Self-pay | Admitting: Internal Medicine

## 2018-07-25 ENCOUNTER — Telehealth: Payer: Self-pay | Admitting: Cardiology

## 2018-07-25 NOTE — Telephone Encounter (Signed)
Spoke w/ pt and requested that he send a manual transmission b/c his home monitor has not updated in at least 14 days.   

## 2018-07-26 LAB — CUP PACEART REMOTE DEVICE CHECK
Date Time Interrogation Session: 20190720013710
MDC IDC PG IMPLANT DT: 20180907

## 2018-08-12 NOTE — Progress Notes (Signed)
FOLLOW UP  Assessment and Plan:   P. A. Fib CHADSVASc - 6 ; Treated by elequis; currently has loop recorder No signs of excessive bleeding - reminded to present to ED for any falls/head injury followed by cardiology  Hypertension Well controlled with current medications  Monitor blood pressure at home; patient to call if consistently greater than 130/80 Continue DASH diet.   Reminder to go to the ER if any CP, SOB, nausea, dizziness, severe HA, changes vision/speech, left arm numbness and tingling and jaw pain.  Cholesterol Currently at goal; continue atorvastatin Continue low cholesterol diet and exercise.  Check lipid panel.   Diabetes with diabetic chronic kidney disease and with other circulatory complications Continue medication: metformin, glipizide Continue diet and exercise.  Perform daily foot/skin check, notify office of any concerning changes.  Check A1C  Hypothyroidism continue medications the same pending lab results reminded to take on an empty stomach 30-70mins before food.  check TSH level  BMi 25 Continue to recommend diet heavy in fruits and veggies and low in animal meats, cheeses, and dairy products, appropriate calorie intake Discuss exercise recommendations routinely Continue to monitor weight at each visit  Vitamin D Def Near goal at last visit; continue supplementation to maintain goal of 70-100 Defer Vit D level   Protein-calorie malnutrition (HCC) Monitor via CMP/GFR, increase protein intake  Continue diet and meds as discussed. Further disposition pending results of labs. Discussed med's effects and SE's.   Over 30 minutes of exam, counseling, chart review, and critical decision making was performed.   Future Appointments  Date Time Provider [place]  08/23/2018  1:30 PM Armbruster, Carlota Raspberry, MD [facility]  09/17/2018  8:45 AM [facility] DEVICE REMOTES [facility]  09/17/2018 11:15 AM Deboraha Sprang, MD  [facility]  11/27/2018  9:30 AM [name], MD [facility] None  12/12/2018 11:00 AM [facility] LAB 1 [facility] None  12/19/2018  9:30 AM Wyatt Portela, MD [facility] None  02/11/2019 10:15 AM Venancio Poisson, NP [facility] None  04/05/2019 11:20 AM Jamse Arn, MD [facility] CPR  06/06/2019 10:00 AM [name], MD [facility] None    ----------------------------------------------------------------------------------------------------------------------  HPI 77 y.o. male  presents for 3 month follow up on hypertension, cholesterol, diabetes, weight and vitamin D deficiency. Patient was hospitalized Sept 4-05/2017 with  R Cerebellar thrombosis w/ mild R sided ataxia. He was discharged from in-patient on Sept 18 and received intensive therapies via home health. He has progressed from walker to wide based cane if needed when out, but does ambulate independently in his home. Questionable hx of a. Fib was noted while hospitalized with stroke; previously negative on 30 day holter; TEE was negative, loop recorder was placed which did note an episode of a. Fib/flutter. He has been started on elequis and followed closely by cardiology. 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 He is followed by neurology as well.   BMI is Body mass index is 25.94 kg/m., he has been working on diet and exercise, he has been walking more[facility]  Wt Readings from Last 3 Encounters:  08/15/18 186 lb (84.4 kg)  05/10/18 184 lb 3.2 oz (83.6 kg)  04/05/18 186 lb 3.2 oz (84.5 kg)   His blood pressure has been controlled at home, today their BP is BP: 128/60  He does workout. He denies chest pain, shortness of breath, dizziness.   He is on cholesterol medication Atorvastatin 40 mg  daily and denies myalgias. His cholesterol is at goal. The cholesterol last visit was:   Lab Results  Component Value Date   CHOL 109 05/10/2018   HDL 49 05/10/2018    LDLCALC 46 05/10/2018   TRIG 49 05/10/2018   CHOLHDL 2.2 05/10/2018    He has been working on diet and exercise for T2 diabetes treated by metformin and glipizide, and denies foot ulcerations, hypoglycemia , increased appetite, nausea, paresthesia of the feet, polydipsia, polyuria and visual disturbances. He does not check blood sugars, does regular urine glucose dip checks BID which have been normal. Last A1C in the office was:  Lab Results  Component Value Date   HGBA1C 6.7 (H) 05/10/2018   He is on thyroid medication. His medication was not changed last visit. He currently takes 50 mcg S, Tu, Th and 1/2 tab all other days.   Lab Results  Component Value Date   TSH 0.83 05/10/2018   Patient is on Vitamin D supplement.   Lab Results  Component Value Date   VD25OH 64 05/10/2018       Current Medications:  Current Outpatient Medications on File Prior to Visit  Medication Sig  . atorvastatin (LIPITOR) 40 MG tablet TAKE 1 TABLET BY MOUTH  DAILY AT 6 PM.  . Cholecalciferol (VITAMIN D PO) Take 5,000 Units by mouth daily.  . Cyanocobalamin (B-12) 50 MCG TABS Take 50 mcg by mouth daily.  [name] ELIQUIS 5 MG TABS tablet TAKE 1 TABLET BY MOUTH TWO  TIMES DAILY  . finasteride (PROSCAR) 5 MG tablet TAKE 1 TABLET BY MOUTH  EVERY DAY FOR PROSTATE  . glipiZIDE (GLUCOTROL) 5 MG tablet TAKE 1 TABLET BY MOUTH 2  TIMES DAILY BEFORE A MEAL.  [name] levothyroxine (SYNTHROID, LEVOTHROID) 50 MCG tablet TAKE 1 TABLET BY MOUTH  DAILY BEFORE BREAKFAST  . lisinopril (PRINIVIL,ZESTRIL) 5 MG tablet TAKE 1 TABLET BY MOUTH  DAILY  . Magnesium 250 MG TABS Take 250 mg by mouth daily.  . metFORMIN (GLUCOPHAGE-XR) 500 MG 24 hr tablet TAKE 1 TABLET BY MOUTH 4  TIMES DAILY FOR DIABETES  . Multiple Vitamin (MULTIVITAMIN) capsule Take 1 capsule by mouth daily.    . vitamin C (ASCORBIC ACID) 500 MG tablet Take 500 mg by mouth daily.     No current facility-administered medications on file prior to visit.      Allergies:   Allergies  Allergen Reactions  . Penicillins Other (See Comments)     Medical History:  Past Medical History:  Diagnosis Date  . BPH (benign prostatic hyperplasia)   . Diabetes mellitus ([place])   . Hyperlipidemia   . Hypertension   . Other testicular hypofunction   . Stroke ([place])   . Type II or unspecified type diabetes mellitus without mention of complication, not stated as uncontrolled    Family history- Reviewed and unchanged Social history- Reviewed and unchanged    Review of Systems:  Review of Systems  Constitutional: Negative for malaise/fatigue and weight loss.  HENT: Negative for hearing loss and tinnitus.   Eyes: Negative for blurred vision and double vision.  Respiratory: Negative for cough, shortness of breath and wheezing.   Cardiovascular: Negative for chest pain, palpitations, orthopnea, claudication and leg swelling.  Gastrointestinal: Negative for abdominal pain, blood in stool, constipation, diarrhea, heartburn, melena, nausea and vomiting.  Genitourinary: Negative.   Musculoskeletal: Negative for joint pain and myalgias.  Skin: Negative for rash.  Neurological: Negative for dizziness, tingling, sensory change, weakness and headaches.  Endo/Heme/Allergies:  Negative for polydipsia.  Psychiatric/Behavioral: Negative.   All other systems reviewed and are negative.   Physical Exam: BP 128/60   Pulse 76   Temp (!) 97.5 F (36.4 C)   Ht 5\' 11"  (1.803 m)   Wt 186 lb (84.4 kg)   SpO2 98%   BMI 25.94 kg/m  Wt Readings from Last 3 Encounters:  08/15/18 186 lb (84.4 kg)  05/10/18 184 lb 3.2 oz (83.6 kg)  04/05/18 186 lb 3.2 oz (84.5 kg)   General Appearance: Well nourished, in no apparent distress. Eyes: PERRLA, EOMs, conjunctiva no swelling or erythema Sinuses: No Frontal/maxillary tenderness ENT/Mouth: Ext aud canals clear, TMs without erythema, bulging. No erythema, swelling, or exudate on post pharynx.  Tonsils not swollen or erythematous. Hearing  normal.  Neck: Supple, thyroid normal.  Respiratory: Respiratory effort normal, BS equal bilaterally without rales, rhonchi, wheezing or stridor.  Cardio: RRR with no MRGs. Brisk peripheral pulses without edema.  Abdomen: Soft, + BS.  Non tender, no guarding, rebound, hernias, masses. Lymphatics: Non tender without lymphadenopathy.  Musculoskeletal: Full ROM, 5/5 strength, slow steady gait with cane Skin: Warm, dry without rashes, lesions, ecchymosis.  Neuro: Cranial nerves intact. No cerebellar symptoms.  Psych: Awake and oriented X 3, normal affect, Insight and Judgment appropriate.    Izora Ribas, NP 12:13 PM [facility]

## 2018-08-13 ENCOUNTER — Ambulatory Visit (INDEPENDENT_AMBULATORY_CARE_PROVIDER_SITE_OTHER): Payer: Medicare Other | Admitting: *Deleted

## 2018-08-13 DIAGNOSIS — I639 Cerebral infarction, unspecified: Secondary | ICD-10-CM

## 2018-08-14 LAB — CUP PACEART REMOTE DEVICE CHECK
MDC IDC PG IMPLANT DT: 20180907
MDC IDC SESS DTM: 20190822023826

## 2018-08-14 NOTE — Progress Notes (Signed)
[facility] Summary Report / Loop Recorder 

## 2018-08-15 ENCOUNTER — Encounter: Payer: Self-pay | Admitting: Adult Health

## 2018-08-15 ENCOUNTER — Ambulatory Visit (INDEPENDENT_AMBULATORY_CARE_PROVIDER_SITE_OTHER): Payer: Medicare Other | Admitting: Adult Health

## 2018-08-15 VITALS — BP 128/60 | HR 76 | Temp 97.5°F | Ht 71.0 in | Wt 186.0 lb

## 2018-08-15 DIAGNOSIS — Z23 Encounter for immunization: Secondary | ICD-10-CM | POA: Diagnosis not present

## 2018-08-15 DIAGNOSIS — E559 Vitamin D deficiency, unspecified: Secondary | ICD-10-CM | POA: Diagnosis not present

## 2018-08-15 DIAGNOSIS — E8809 Other disorders of plasma-protein metabolism, not elsewhere classified: Secondary | ICD-10-CM

## 2018-08-15 DIAGNOSIS — Z6825 Body mass index (BMI) 25.0-25.9, adult: Secondary | ICD-10-CM

## 2018-08-15 DIAGNOSIS — E785 Hyperlipidemia, unspecified: Secondary | ICD-10-CM | POA: Diagnosis not present

## 2018-08-15 DIAGNOSIS — E46 Unspecified protein-calorie malnutrition: Secondary | ICD-10-CM | POA: Diagnosis not present

## 2018-08-15 DIAGNOSIS — E1122 Type 2 diabetes mellitus with diabetic chronic kidney disease: Secondary | ICD-10-CM | POA: Diagnosis not present

## 2018-08-15 DIAGNOSIS — Z79899 Other long term (current) drug therapy: Secondary | ICD-10-CM

## 2018-08-15 DIAGNOSIS — I10 Essential (primary) hypertension: Secondary | ICD-10-CM

## 2018-08-15 DIAGNOSIS — D649 Anemia, unspecified: Secondary | ICD-10-CM

## 2018-08-15 DIAGNOSIS — I480 Paroxysmal atrial fibrillation: Secondary | ICD-10-CM | POA: Diagnosis not present

## 2018-08-15 DIAGNOSIS — E039 Hypothyroidism, unspecified: Secondary | ICD-10-CM

## 2018-08-15 DIAGNOSIS — N182 Chronic kidney disease, stage 2 (mild): Secondary | ICD-10-CM

## 2018-08-15 NOTE — Patient Instructions (Addendum)
Goals    . DIET - INCREASE WATER INTAKE     5-6 bottles of water daily    . Exercise 150 min/wk Moderate Activity    . HEMOGLOBIN A1C < 7.0        Hypoglycemia Hypoglycemia is when the sugar (glucose) level in the blood is too low. Symptoms of low blood sugar may include:  Feeling: ? Hungry. ? Worried or nervous (anxious). ? Sweaty and clammy. ? Confused. ? Dizzy. ? Sleepy. ? Sick to your stomach (nauseous).  Having: ? A fast heartbeat. ? A headache. ? A change in your vision. ? Jerky movements that you cannot control (seizure). ? Nightmares. ? Tingling or no feeling (numbness) around the mouth, lips, or tongue.  Having trouble with: ? Talking. ? Paying attention (concentrating). ? Moving (coordination). ? Sleeping.  Shaking.  Passing out (fainting).  Getting upset easily (irritability).  Low blood sugar can happen to people who have diabetes and people who do not have diabetes. Low blood sugar can happen quickly, and it can be an emergency. Treating Low Blood Sugar Low blood sugar is often treated by eating or drinking something sugary right away. If you can think clearly and swallow safely, follow the 15:15 rule:  Take 15 grams of a fast-acting carb (carbohydrate). Some fast-acting carbs are: ? 1 tube of glucose gel. ? 3 sugar tablets (glucose pills). ? 6-8 pieces of hard candy. ? 4 oz (120 mL) of fruit juice. ? 4 oz (120 mL) of regular (not diet) soda.  Check your blood sugar 15 minutes after you take the carb.  If your blood sugar is still at or below 70 mg/dL (3.9 mmol/L), take 15 grams of a carb again.  If your blood sugar does not go above 70 mg/dL (3.9 mmol/L) after 3 tries, get help right away.  After your blood sugar goes back to normal, eat a meal or a snack within 1 hour.  Treating Very Low Blood Sugar If your blood sugar is at or below 54 mg/dL (3 mmol/L), you have very low blood sugar (severe hypoglycemia). This is an emergency. Do not wait  to see if the symptoms will go away. Get medical help right away. Call your local emergency services (911 in the [place] Do not drive yourself to the hospital. If you have very low blood sugar and you cannot eat or drink, you may need a glucagon shot (injection). A family member or friend should learn how to check your blood sugar and how to give you a glucagon shot. Ask your doctor if you need to have a glucagon shot kit at home. Follow these instructions at home: General instructions  Avoid any diets that cause you to not eat enough food. Talk with your doctor before you start any new diet.  Take over-the-counter and prescription medicines only as told by your doctor.  Limit alcohol to no more than 1 drink per day for nonpregnant women and 2 drinks per day for men. One drink equals 12 oz of beer, 5 oz of wine, or 1 oz of hard liquor.  Keep all follow-up visits as told by your doctor. This is important. If You Have Diabetes:   Make sure you know the symptoms of low blood sugar.  Always keep a source of sugar with you, such as: ? Sugar. ? Sugar tablets. ? Glucose gel. ? Fruit juice. ? Regular soda (not diet soda). ? Milk. ? Hard candy. ? Honey.  Take your medicines as told.  Follow your exercise and meal plan. ? Eat on time. Do not skip meals. ? Follow your sick day plan when you cannot eat or drink normally. Make this plan ahead of time with your doctor.  Check your blood sugar as often as told by your doctor. Always check before and after exercise.  Share your diabetes care plan with: ? Your work or school. ? People you live with.  Check your pee (urine) for ketones: ? When you are sick. ? As told by your doctor.  Carry a card or wear jewelry that says you have diabetes. If You Have Low Blood Sugar From Other Causes:   Check your blood sugar as often as told by your doctor.  Follow instructions from your doctor about what you cannot eat or drink. Contact a doctor  if:  You have trouble keeping your blood sugar in your target range.  You have low blood sugar often. Get help right away if:  You still have symptoms after you eat or drink something sugary.  Your blood sugar is at or below 54 mg/dL (3 mmol/L).  You have jerky movements that you cannot control.  You pass out. These symptoms may be an emergency. Do not wait to see if the symptoms will go away. Get medical help right away. Call your local emergency services (911 in the [place] Do not drive yourself to the hospital. This information is not intended to replace advice given to you by your health care provider. Make sure you discuss any questions you have with your health care provider. Document Released: 02/01/2010 Document Revised: 04/14/2016 Document Reviewed: 12/11/2015 Elsevier Interactive Patient Education  [employer].

## 2018-08-16 ENCOUNTER — Encounter: Payer: Medicare Other | Admitting: Internal Medicine

## 2018-08-16 LAB — CBC WITH DIFFERENTIAL/PLATELET
Basophils Absolute: 30 cells/uL (ref 0–200)
Basophils Relative: 0.6 %
Eosinophils Absolute: 60 cells/uL (ref 15–500)
Eosinophils Relative: 1.2 %
HCT: 38.5 % (ref 38.5–50.0)
Hemoglobin: 13.3 g/dL (ref 13.2–17.1)
Lymphs Abs: 2185 cells/uL (ref 850–3900)
MCH: 29.1 pg (ref 27.0–33.0)
MCHC: 34.5 g/dL (ref 32.0–36.0)
MCV: 84.2 fL (ref 80.0–100.0)
MPV: 10.3 fL (ref 7.5–12.5)
Monocytes Relative: 10.9 %
Neutro Abs: 2180 cells/uL (ref 1500–7800)
Neutrophils Relative %: 43.6 %
PLATELETS: 202 10*3/uL (ref 140–400)
RBC: 4.57 10*6/uL (ref 4.20–5.80)
RDW: 12.9 % (ref 11.0–15.0)
Total Lymphocyte: 43.7 %
WBC: 5 10*3/uL (ref 3.8–10.8)
WBCMIX: 545 {cells}/uL (ref 200–950)

## 2018-08-16 LAB — COMPLETE METABOLIC PANEL WITH GFR
AG RATIO: 1.9 (calc) (ref 1.0–2.5)
ALT: 26 U/L (ref 9–46)
AST: 22 U/L (ref 10–35)
Albumin: 3.9 g/dL (ref 3.6–5.1)
Alkaline phosphatase (APISO): 70 U/L (ref 40–115)
BILIRUBIN TOTAL: 0.6 mg/dL (ref 0.2–1.2)
BUN: 14 mg/dL (ref 7–25)
CALCIUM: 9.4 mg/dL (ref 8.6–10.3)
CO2: 28 mmol/L (ref 20–32)
Chloride: 106 mmol/L (ref 98–110)
Creat: 1.05 mg/dL (ref 0.70–1.18)
GFR, Est African American: 79 mL/min/{1.73_m2} (ref >=60)
GFR, Est Non African American: 68 mL/min/{1.73_m2} (ref >=60)
Globulin: 2.1 g/dL (calc) (ref 1.9–3.7)
Glucose, Bld: 112 mg/dL — ABNORMAL HIGH (ref 65–99)
POTASSIUM: 4.4 mmol/L (ref 3.5–5.3)
Sodium: 140 mmol/L (ref 135–146)
Total Protein: 6 g/dL — ABNORMAL LOW (ref 6.1–8.1)

## 2018-08-16 LAB — TSH: TSH: 0.45 mIU/L (ref 0.40–4.50)

## 2018-08-16 LAB — LIPID PANEL
CHOLESTEROL: 106 mg/dL (ref <200)
HDL: 49 mg/dL (ref >40)
LDL Cholesterol (Calc): 44 mg/dL (calc)
Non-HDL Cholesterol (Calc): 57 mg/dL (calc) (ref <130)
Total CHOL/HDL Ratio: 2.2 (calc) (ref <5.0)
Triglycerides: 47 mg/dL (ref <150)

## 2018-08-16 LAB — HEMOGLOBIN A1C
EAG (MMOL/L): 8.2 (calc)
Hgb A1c MFr Bld: 6.8 % of total Hgb — ABNORMAL HIGH (ref <5.7)
MEAN PLASMA GLUCOSE: 148 (calc)

## 2018-08-16 LAB — MAGNESIUM: Magnesium: 1.9 mg/dL (ref 1.5–2.5)

## 2018-08-20 LAB — CUP PACEART REMOTE DEVICE CHECK
Date Time Interrogation Session: 20190924023534
Implantable Pulse Generator Implant Date: 20180907

## 2018-08-23 ENCOUNTER — Ambulatory Visit: Payer: Medicare Other | Admitting: Gastroenterology

## 2018-08-23 ENCOUNTER — Encounter: Payer: Self-pay | Admitting: Gastroenterology

## 2018-08-23 VITALS — BP 136/70 | HR 88 | Ht 70.5 in | Wt 186.2 lb

## 2018-08-23 DIAGNOSIS — Z86010 Personal history of colonic polyps: Secondary | ICD-10-CM

## 2018-08-23 DIAGNOSIS — Z800 Family history of malignant neoplasm of digestive organs: Secondary | ICD-10-CM | POA: Diagnosis not present

## 2018-08-23 DIAGNOSIS — Z8673 Personal history of transient ischemic attack (TIA), and cerebral infarction without residual deficits: Secondary | ICD-10-CM

## 2018-08-23 DIAGNOSIS — Z7901 Long term (current) use of anticoagulants: Secondary | ICD-10-CM | POA: Diagnosis not present

## 2018-08-23 NOTE — Progress Notes (Signed)
HPI :  77 year old male with a history of stroke on anticoagulation, diabetes, hypertension, history of colon polyps, here to discuss surveillance colonoscopy.  History of stroke in 2018. Admitted for R cerebellar thrombosis with ataxia. 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 Now on anticoagulation. He uses a cane to ambulate for balance. He reports he has recovered from this.   He had a history of one small adenoma removed in 2013. He denies any blood in the stools, he denies any bowel changes that are recent for him. He has occasional constipation. He denies any abdominal pains. He is eating well, he denies any reflux symptoms. He does not have any family history of colon cancer, however his father had stomach cancer reportedly passed away from the surrounding age 27. He asked about endoscopy to screen for gastric cancer. He otherwise feels well without complaints today.  Colonoscopy 09/07/2012 - 64mm polyp removed - adenoma  Past Medical History:  Diagnosis Date  . Adenomatous colon polyp   . Anal fissure   . BPH (benign prostatic hyperplasia)   . Diabetes mellitus ([place])   . Hyperlipidemia   . Hypertension   . Hypothyroidism   . Other testicular hypofunction   . Stroke ([place])   . Type II or unspecified type diabetes mellitus without mention of complication, not stated as uncontrolled      Past Surgical History:  Procedure Laterality Date  . INGUINAL HERNIA REPAIR Bilateral    1968 and 2004  . LOOP RECORDER INSERTION N/A 07/28/2017   Procedure: LOOP RECORDER INSERTION;  Surgeon: Deboraha Sprang, MD;  Location: [facility];  Service: Cardiovascular;  Laterality: N/A;  . TEE WITHOUT CARDIOVERSION N/A 07/28/2017   Procedure: TRANSESOPHAGEAL ECHOCARDIOGRAM (TEE);  Surgeon: Sueanne Margarita, MD;  Location: [facility];  Service: Cardiovascular;  Laterality: N/A;   Family History  Problem Relation Age of Onset  . Sickle cell anemia Mother   . Diabetes Father   .  Stomach cancer Father   . Colon cancer Neg Hx   . CAD Neg Hx    Social History   Tobacco Use  . Smoking status: Never Smoker  . Smokeless tobacco: Never Used  Substance Use Topics  . Alcohol use: No  . Drug use: No   Current Outpatient Medications  Medication Sig Dispense Refill  . atorvastatin (LIPITOR) 40 MG tablet TAKE 1 TABLET BY MOUTH  DAILY AT 6 PM. 90 tablet 1  . Cholecalciferol (VITAMIN D PO) Take 5,000 Units by mouth daily.    . Cyanocobalamin (B-12) 50 MCG TABS Take 50 mcg by mouth daily.    [name] ELIQUIS 5 MG TABS tablet TAKE 1 TABLET BY MOUTH TWO  TIMES DAILY 180 tablet 1  . finasteride (PROSCAR) 5 MG tablet TAKE 1 TABLET BY MOUTH  EVERY DAY FOR PROSTATE 90 tablet 1  . glipiZIDE (GLUCOTROL) 5 MG tablet TAKE 1 TABLET BY MOUTH 2  TIMES DAILY BEFORE A MEAL. 180 tablet 1  . levothyroxine (SYNTHROID, LEVOTHROID) 50 MCG tablet TAKE 1 TABLET BY MOUTH  DAILY BEFORE BREAKFAST 90 tablet 1  . lisinopril (PRINIVIL,ZESTRIL) 5 MG tablet TAKE 1 TABLET BY MOUTH  DAILY 90 tablet 1  . Magnesium 250 MG TABS Take 250 mg by mouth daily.    . metFORMIN (GLUCOPHAGE-XR) 500 MG 24 hr tablet TAKE 1 TABLET BY MOUTH 4  TIMES DAILY FOR DIABETES 360 tablet 3  . Multiple Vitamin (MULTIVITAMIN) capsule Take 1 capsule by mouth daily.      [name]  vitamin C (ASCORBIC ACID) 500 MG tablet Take 500 mg by mouth daily.       No current facility-administered medications for this visit.    Allergies  Allergen Reactions  . Penicillins Other (See Comments)     Review of Systems: All systems reviewed and negative except where noted in HPI.   Lab Results  Component Value Date   WBC 5.0 08/15/2018   HGB 13.3 08/15/2018   HCT 38.5 08/15/2018   MCV 84.2 08/15/2018   PLT 202 08/15/2018    Lab Results  Component Value Date   CREATININE 1.05 08/15/2018   BUN 14 08/15/2018   NA 140 08/15/2018   K 4.4 08/15/2018   CL 106 08/15/2018   CO2 28 08/15/2018    Lab Results  Component Value Date   ALT 26 08/15/2018    AST 22 08/15/2018   ALKPHOS 85 10/25/2017   BILITOT 0.6 08/15/2018     Physical Exam: BP 136/70 (BP Location: Left Arm, Patient Position: Sitting, Cuff Size: Normal)   Pulse 88   Ht 5' 10.5" (1.791 m) Comment: height measured without shoes  Wt 186 lb 4 oz (84.5 kg)   BMI 26.35 kg/m  Constitutional: Pleasant, male in no acute distress., walks with cane HEENT: Normocephalic and atraumatic. Conjunctivae are normal. No scleral icterus. Neck supple.  Cardiovascular: Normal rate, regular rhythm.  Pulmonary/chest: Effort normal and breath sounds normal. No wheezing, rales or rhonchi. Abdominal: Soft, nondistended, nontender. There are no masses palpable. No hepatomegaly. Extremities: no edema Lymphadenopathy: No cervical adenopathy noted. Neurological: Alert and oriented to person place and time. Skin: Skin is warm and dry. No rashes noted. Psychiatric: Normal mood and affect. Behavior is normal.   ASSESSMENT AND PLAN: 77 year old male with multiple comorbidities including stroke, here for assessment of the following issues:  History of colon polyps / family history of gastric cancer / anticoagulated / history of stroke - patient with a stroke roughly 1 year ago from which he is recovered. He is now on chronic anticoagulation. We discussed whether or not in light of his comorbidities he want to have a surveillance colonoscopy, as well as an upper endoscopy to screen for gastric cancer given his father's history. He is asymptomatic. I discussed risks and benefits of endoscopy and anesthesia with him. To have a colonoscopy done he would need to hold his Eliquis for 1-2 days in order to safely remove polyps. His risk for stroke during that time would be increased, he could take an aspirin while he holds the Eliquis. He reports he wants to proceed with colonoscopy and upper endoscopy. He asked me to discuss his case with his primary care physician and cardiologist in regards to his risks for  withholding anticoagulation. We will do this and get back to him, and schedule him for procedures if he is cleared to hold anticoagulation.  [place] Cellar, MD [facility]  CC: [name], MD

## 2018-08-28 ENCOUNTER — Telehealth: Payer: Self-pay | Admitting: Gastroenterology

## 2018-08-28 NOTE — Telephone Encounter (Signed)
Thanks Jan 

## 2018-08-28 NOTE — Telephone Encounter (Signed)
Called and spoke to pt.  He wanted to go ahead and schedule for November - wife should be better by then. Scheduled ECL for 09-25-18 and a nurse visit for 09-19-18 to go over instructions. Per Dr. Loni Muse, per Marylu Lund - OK to hold Eliquis for 2 days prior.

## 2018-08-28 NOTE — Telephone Encounter (Signed)
I spoke with Dr. Melford Aase about this patient's case - he called and spoke with the patient about it, and the patient wants to proceed with EGD and colonoscopy and has clearance to hold the Eliquis 2 days before. Patient understands risks / benefits as previously outlined to him at clinic visit. Otherwise, while the patient wants to proceed, his wife is ill and he does not think he can schedule right now. He can call back to schedule at his convenience.  Jan, can you let the patient know I spoke with Dr. Melford Aase about his case - the patient can call back to schedule when he feels he is ready to proceed. Thanks

## 2018-09-04 ENCOUNTER — Ambulatory Visit (INDEPENDENT_AMBULATORY_CARE_PROVIDER_SITE_OTHER): Payer: Medicare Other | Admitting: Adult Health

## 2018-09-04 ENCOUNTER — Encounter: Payer: Self-pay | Admitting: Adult Health

## 2018-09-04 VITALS — BP 128/62 | HR 105 | Temp 97.5°F | Ht 70.5 in | Wt 184.4 lb

## 2018-09-04 DIAGNOSIS — R300 Dysuria: Secondary | ICD-10-CM | POA: Diagnosis not present

## 2018-09-04 DIAGNOSIS — R102 Pelvic and perineal pain: Secondary | ICD-10-CM

## 2018-09-04 DIAGNOSIS — K6289 Other specified diseases of anus and rectum: Secondary | ICD-10-CM

## 2018-09-04 LAB — POC HEMOCCULT BLD/STL (OFFICE/1-CARD/DIAGNOSTIC): FECAL OCCULT BLD: NEGATIVE

## 2018-09-04 MED ORDER — CIPROFLOXACIN HCL 500 MG PO TABS: 500.0000 mg | ORAL_TABLET | Freq: Two times a day (BID) | ORAL | 0 refills | Status: AC

## 2018-09-04 NOTE — Patient Instructions (Signed)
Please call me to discuss how you are doing in 2 weeks after you complete the medication. We may continue therapy for another 2 weeks.    Prostatitis Prostatitis is swelling or inflammation of the prostate gland. The prostate is a walnut-sized gland that is involved in the production of semen. It is located below a man's bladder, in front of the rectum. There are four types of prostatitis:  Chronic nonbacterial prostatitis. This is the most common type of prostatitis. It may be associated with a viral infection or autoimmune disorder.  Acute bacterial prostatitis. This is the least common type of prostatitis. It starts quickly and is usually associated with a bladder infection, high fever, and shaking chills. It can occur at any age.  Chronic bacterial prostatitis. This type usually results from acute bacterial prostatitis that happens repeatedly (is recurrent) or has not been treated properly. It can occur in men of any age but is most common among middle-aged men whose prostate has begun to get larger. The symptoms are not as severe as symptoms caused by acute bacterial prostatitis.  Prostatodynia or chronic pelvic pain syndrome (CPPS). This type is also called pelvic floor disorder. It is associated with increased muscular tone in the pelvis surrounding the prostate.  What are the causes? Bacterial prostatitis is caused by infection from bacteria. Chronic nonbacterial prostatitis may be caused by:  Urinary tract infections (UTIs).  Nerve damage.  A response by the body's disease-fighting system (autoimmune response).  Chemicals in the urine.  The causes of the other types of prostatitis are usually not known. What are the signs or symptoms? Symptoms of this condition vary depending upon the type of prostatitis. If you have acute bacterial prostatitis, you may experience:  Urinary symptoms, such as: ? Painful urination. ? Burning during urination. ? Frequent and sudden urges to  urinate. ? Inability to start urinating. ? A weak or interrupted stream of urine.  Vomiting.  Nausea.  Fever.  Chills.  Inability to empty the bladder completely.  Pain in the: ? Muscles or joints. ? Lower back. ? Lower abdomen.  If you have any of the other types of prostatitis, you may experience:  Urinary symptoms, such as: ? Sudden urges to urinate. ? Frequent urination. ? Difficulty starting urination. ? Weak urine stream. ? Dribbling after urination.  Discharge from the urethra. The urethra is a tube that opens at the end of the penis.  Pain in the: ? Testicles. ? Penis or tip of the penis. ? Rectum. ? Area in front of the rectum and below the scrotum (perineum).  Problems with sexual function.  Painful ejaculation.  Bloody semen.  How is this diagnosed? This condition may be diagnosed based on:  A physical and medical exam.  Your symptoms.  A urine test to check for bacteria.  An exam in which a health care provider uses a finger to feel the prostate (digital rectal exam).  A test of a sample of semen.  Blood tests.  Ultrasound.  Removal of prostate tissue to be examined under a microscope (biopsy).  Tests to check how your body handles urine (urodynamic tests).  A test to look inside your bladder or urethra (cystoscopy).  How is this treated? Treatment for this condition depends on the type of prostatitis. Treatment may involve:  Medicines to relieve pain or inflammation.  Medicines to help relax your muscles.  Physical therapy.  Heat therapy.  Techniques to help you control certain body functions (biofeedback).  Relaxation exercises.  Antibiotic medicine, if your condition is caused by bacteria.  Warm water baths (sitz baths). Sitz baths help with relaxing your pelvic floor muscles, which helps to relieve pressure on the prostate.  Follow these instructions at home:  Take over-the-counter and prescription medicines only as  told by your health care provider.  If you were prescribed an antibiotic, take it as told by your health care provider. Do not stop taking the antibiotic even if you start to feel better.  If physical therapy, biofeedback, or relaxation exercises were prescribed, do exercises as instructed.  Take sitz baths as directed by your health care provider. For a sitz bath, sit in warm water that is deep enough to cover your hips and buttocks.  Keep all follow-up visits as told by your health care provider. This is important. Contact a health care provider if:  Your symptoms get worse.  You have a fever. Get help right away if:  You have chills.  You feel nauseous.  You vomit.  You feel light-headed or feel like you are going to faint.  You are unable to urinate.  You have blood or blood clots in your urine. This information is not intended to replace advice given to you by your health care provider. Make sure you discuss any questions you have with your health care provider. Document Released: 11/04/2000 Document Revised: 07/28/2016 Document Reviewed: 07/28/2016 Elsevier Interactive Patient Education  2017 [place].    Ciprofloxacin tablets What is this medicine? CIPROFLOXACIN (sip roe FLOX a sin) is a quinolone antibiotic. It is used to treat certain kinds of bacterial infections. It will not work for colds, flu, or other viral infections. This medicine may be used for other purposes; ask your health care provider or pharmacist if you have questions. COMMON BRAND NAME(S): Cipro What should I tell my health care provider before I take this medicine? They need to know if you have any of these conditions: -bone problems -history of low levels of potassium in the blood -joint problems -irregular heartbeat -kidney disease -myasthenia gravis -seizures -tendon problems -tingling of the fingers or toes, or other nerve disorder -an unusual or allergic reaction to ciprofloxacin,  other antibiotics or medicines, foods, dyes, or preservatives -pregnant or trying to get pregnant -breast-feeding How should I use this medicine? Take this medicine by mouth with a glass of water. Follow the directions on the prescription label. Take your medicine at regular intervals. Do not take your medicine more often than directed. Take all of your medicine as directed even if you think your are better. Do not skip doses or stop your medicine early. You can take this medicine with food or on an empty stomach. It can be taken with a meal that contains dairy or calcium, but do not take it alone with a dairy product, like milk or yogurt or calcium-fortified juice. A special MedGuide will be given to you by the pharmacist with each prescription and refill. Be sure to read this information carefully each time. Talk to your pediatrician regarding the use of this medicine in children. Special care may be needed. Overdosage: If you think you have taken too much of this medicine contact a poison control center or emergency room at once. NOTE: This medicine is only for you. Do not share this medicine with others. What if I miss a dose? If you miss a dose, take it as soon as you can. If it is almost time for your next dose, take only that dose. Do  not take double or extra doses. What may interact with this medicine? Do not take this medicine with any of the following medications: -cisapride -dofetilide -dronedarone -flibanserin -lomitapide -pimozide -thioridazine -tizanidine -ziprasidone This medicine may also interact with the following medications: -antacids -birth control pills -caffeine -certain medicines for diabetes, like glipizide or glyburide -certain medicines that treat or prevent blood clots like warfarin -clozapine -cyclosporine -didanosine (ddI) buffered tablets or powder -duloxetine -lanthanum carbonate -lidocaine -methotrexate -multivitamins -NSAIDS, medicines for pain  and inflammation, like ibuprofen or naproxen -olanzapine -omeprazole -other medicines that prolong the QT interval (cause an abnormal heart rhythm) -phenytoin -probenecid -ropinirole -sevelamer -sildenafil -sucralfate -theophylline -zolpidem This list may not describe all possible interactions. Give your health care provider a list of all the medicines, herbs, non-prescription drugs, or dietary supplements you use. Also tell them if you smoke, drink alcohol, or use illegal drugs. Some items may interact with your medicine. What should I watch for while using this medicine? Tell your doctor or health care professional if your symptoms do not improve. Do not treat diarrhea with over the counter products. Contact your doctor if you have diarrhea that lasts more than 2 days or if it is severe and watery. You may get drowsy or dizzy. Do not drive, use machinery, or do anything that needs mental alertness until you know how this medicine affects you. Do not stand or sit up quickly, especially if you are an older patient. This reduces the risk of dizzy or fainting spells. This medicine can make you more sensitive to the sun. Keep out of the sun. If you cannot avoid being in the sun, wear protective clothing and use sunscreen. Do not use sun lamps or tanning beds/booths. Avoid antacids, aluminum, calcium, iron, magnesium, and zinc products for 6 hours before and 2 hours after taking a dose of this medicine. What side effects may I notice from receiving this medicine? Side effects that you should report to your doctor or health care professional as soon as possible: -allergic reactions like skin rash or hives, swelling of the face, lips, or tongue -anxious -confusion -depressed mood -diarrhea -fast, irregular heartbeat -hallucination, loss of contact with reality -joint, muscle, or tendon pain or swelling -pain, tingling, numbness in the hands or feet -suicidal thoughts or other mood  changes -sunburn -unusually weak or tired Side effects that usually do not require medical attention (report to your doctor or health care professional if they continue or are bothersome): -dry mouth -headache -nausea -trouble sleeping This list may not describe all possible side effects. Call your doctor for medical advice about side effects. You may report side effects to FDA at 1-800-FDA-1088. Where should I keep my medicine? Keep out of the reach of children. Store at room temperature below 30 degrees C (86 degrees F). Keep container tightly closed. Throw away any unused medicine after the expiration date. NOTE: This sheet is a summary. It may not cover all possible information. If you have questions about this medicine, talk to your doctor, pharmacist, or health care provider.  2018 Elsevier/Gold Standard (2016-06-17 14:42:02)

## 2018-09-04 NOTE — Progress Notes (Signed)
Assessment and Plan:  Jousha was seen today for polyuria, rectal pain and groin pain.  Diagnoses and all orders for this visit:  Rectal pain/Perineal pain in male/dysuria ? UTI vs mild prostatitis, will proceed with 2 weeks of Cipro pending labs and evaluate progress Exam unremarkable, prostate not significantly tender or boggy on DRE, hemoccult neg Has upcoming colonoscopy in 3 weeks with Dr. Deatra Ina that I encouraged him to present for -     CBC with Differential/Platelet -     COMPLETE METABOLIC PANEL WITH GFR -     Urinalysis w microscopic + reflex cultur -     PSA -     ciprofloxacin (CIPRO) 500 MG tablet; Take 1 tablet (500 mg total) by mouth 2 (two) times daily for 14 days.  Further disposition pending results of labs. Discussed med's effects and SE's.   Over 15 minutes of exam, counseling, chart review, and critical decision making was performed.   Future Appointments  Date Time Provider [place]  09/17/2018  8:45 AM [facility] DEVICE REMOTES [facility]  09/17/2018 11:15 AM Deboraha Sprang, MD [facility]  09/19/2018  8:30 AM [facility] PREVISIT RM50 [facility]  09/25/2018  3:00 PM Armbruster, Carlota Raspberry, MD [facility]  11/27/2018  9:30 AM [name], MD [facility] None  12/12/2018 11:00 AM [facility] LAB 1 [facility] None  12/19/2018  9:30 AM Wyatt Portela, MD [facility] None  02/11/2019 10:15 AM Venancio Poisson, NP [facility] None  04/05/2019 11:20 AM Jamse Arn, MD [facility]  06/06/2019 10:00 AM [name], MD [facility] None    ------------------------------------------------------------------------------------------------------------------   HPI BP 128/62   Pulse (!) 105   Temp (!) 97.5 F (36.4 C)   Ht 5' 10.5" (1.791 m)   Wt 184 lb 6.4 oz (83.6 kg)   SpO2 96%   BMI 26.08 kg/m   77 y.o.male with BPH on finasteride, T2DM presents for evaluation of rectal and pain "behind my penis" that is  present at rest x 1 month, pressure with urination x 2 weeks, was having strong urine odor that has resolved. He reports pain character as aching, was intermittent, now constant. Pain is 7-8/10. He reports rectal pain not worse with BMs, he does have to strain a bit with BMs, somewhat constipated, having large BM every other. He denies hematochezia or melena.   He has tried applying petroleum to rectum but not sure if this helped or not.   He denies fever/chills, nausea, vomiting, headaches, rashes.   Last colonoscopy in 08/2012 with Dr. Deatra Ina, had some non-adenomatous polyps. Has repeat study scheduled 09/25/2018.   S/p bilateral inguinal hernia repair.   Lab Results  Component Value Date   PSA 0.3 05/10/2018   PSA 0.3 04/14/2017   PSA 0.29 03/01/2016    Past Medical History:  Diagnosis Date  . Adenomatous colon polyp   . Anal fissure   . BPH (benign prostatic hyperplasia)   . Diabetes mellitus ([place])   . Hyperlipidemia   . Hypertension   . Hypothyroidism   . Other testicular hypofunction   . Stroke ([place])   . Type II or unspecified type diabetes mellitus without mention of complication, not stated as uncontrolled      Allergies  Allergen Reactions  . Penicillins Other (See Comments)    Current Outpatient Medications on File Prior to Visit  Medication Sig  . atorvastatin (LIPITOR) 40 MG tablet TAKE 1 TABLET BY MOUTH  DAILY AT 6 PM.  . Cholecalciferol (VITAMIN D  PO) Take 5,000 Units by mouth daily.  . Cyanocobalamin (B-12) 50 MCG TABS Take 50 mcg by mouth daily.  [name] ELIQUIS 5 MG TABS tablet TAKE 1 TABLET BY MOUTH TWO  TIMES DAILY  . finasteride (PROSCAR) 5 MG tablet TAKE 1 TABLET BY MOUTH  EVERY DAY FOR PROSTATE  . glipiZIDE (GLUCOTROL) 5 MG tablet TAKE 1 TABLET BY MOUTH 2  TIMES DAILY BEFORE A MEAL.  [name] levothyroxine (SYNTHROID, LEVOTHROID) 50 MCG tablet TAKE 1 TABLET BY MOUTH  DAILY BEFORE BREAKFAST  . lisinopril (PRINIVIL,ZESTRIL) 5 MG tablet TAKE 1 TABLET BY MOUTH  DAILY   . Magnesium 250 MG TABS Take 250 mg by mouth daily.  . metFORMIN (GLUCOPHAGE-XR) 500 MG 24 hr tablet TAKE 1 TABLET BY MOUTH 4  TIMES DAILY FOR DIABETES  . Multiple Vitamin (MULTIVITAMIN) capsule Take 1 capsule by mouth daily.    . vitamin C (ASCORBIC ACID) 500 MG tablet Take 500 mg by mouth daily.     No current facility-administered medications on file prior to visit.     ROS: all negative except above.   Physical Exam:  BP 128/62   Pulse (!) 105   Temp (!) 97.5 F (36.4 C)   Ht 5' 10.5" (1.791 m)   Wt 184 lb 6.4 oz (83.6 kg)   SpO2 96%   BMI 26.08 kg/m   General Appearance: Well nourished, in no apparent distress. Eyes: conjunctiva no swelling or erythema ENT/Mouth: No erythema, swelling, or exudate on post pharynx.  Tonsils not swollen or erythematous. Hearing normal.  Neck: Supple Respiratory: Respiratory effort normal, BS equal bilaterally without rales, rhonchi, wheezing or stridor.  Cardio: RRR with no MRGs. Brisk peripheral pulses without edema.  Abdomen: Soft, + BS.  Non tender, no guarding, rebound, hernias, masses. He has bilateral inguinal hernia repair scars fully healed.  Lymphatics: Non tender without lymphadenopathy.  Musculoskeletal: Full ROM, slow gait with cane.   Skin: Warm, dry without rashes, lesions, ecchymosis.  GU: Male genitalia: not done no penile lesions or discharge no testicular masses no bladder distension noted Penis: uncircumcised Testicles: normal, no masses Scrotum: normal Skin of perineum: normal Rectal examination: normal, anal sphincter normal, Guaiac negative and tender against anterior wall without palpable abnormality Prostate examination: normal for age, normal consistency, non tender, no specific nodules Psych: Awake and oriented X 3, normal affect, Insight and Judgment appropriate.     Izora Ribas, NP 3:28 PM [facility]

## 2018-09-05 ENCOUNTER — Other Ambulatory Visit: Payer: Self-pay | Admitting: Adult Health

## 2018-09-05 DIAGNOSIS — N390 Urinary tract infection, site not specified: Secondary | ICD-10-CM

## 2018-09-05 DIAGNOSIS — R319 Hematuria, unspecified: Principal | ICD-10-CM

## 2018-09-07 LAB — URINALYSIS W MICROSCOPIC + REFLEX CULTURE
BACTERIA UA: NONE SEEN /HPF
Bilirubin Urine: NEGATIVE
Glucose, UA: NEGATIVE
HYALINE CAST: NONE SEEN /LPF
Ketones, ur: NEGATIVE
Nitrites, Initial: NEGATIVE
PH: 6 (ref 5.0–8.0)
RBC / HPF: >=60 /HPF — AB (ref 0–2)
SPECIFIC GRAVITY, URINE: 1.013 (ref 1.001–1.03)
Squamous Epithelial / LPF: NONE SEEN /HPF (ref <=5)
WBC, UA: >=60 /HPF — AB (ref 0–5)

## 2018-09-07 LAB — CBC WITH DIFFERENTIAL/PLATELET
BASOS PCT: 0.3 %
Basophils Absolute: 20 cells/uL (ref 0–200)
EOS ABS: 91 {cells}/uL (ref 15–500)
Eosinophils Relative: 1.4 %
HCT: 37 % — ABNORMAL LOW (ref 38.5–50.0)
Hemoglobin: 13 g/dL — ABNORMAL LOW (ref 13.2–17.1)
Lymphs Abs: 1515 cells/uL (ref 850–3900)
MCH: 29.4 pg (ref 27.0–33.0)
MCHC: 35.1 g/dL (ref 32.0–36.0)
MCV: 83.7 fL (ref 80.0–100.0)
MPV: 10.4 fL (ref 7.5–12.5)
Monocytes Relative: 9.6 %
NEUTROS PCT: 65.4 %
Neutro Abs: 4251 cells/uL (ref 1500–7800)
PLATELETS: 195 10*3/uL (ref 140–400)
RBC: 4.42 10*6/uL (ref 4.20–5.80)
RDW: 12.6 % (ref 11.0–15.0)
TOTAL LYMPHOCYTE: 23.3 %
WBC: 6.5 10*3/uL (ref 3.8–10.8)
WBCMIX: 624 {cells}/uL (ref 200–950)

## 2018-09-07 LAB — URINE CULTURE
MICRO NUMBER: 91243775
SPECIMEN QUALITY: ADEQUATE

## 2018-09-07 LAB — COMPLETE METABOLIC PANEL WITH GFR
AG RATIO: 1.9 (calc) (ref 1.0–2.5)
ALT: 18 U/L (ref 9–46)
AST: 17 U/L (ref 10–35)
Albumin: 3.8 g/dL (ref 3.6–5.1)
Alkaline phosphatase (APISO): 69 U/L (ref 40–115)
BILIRUBIN TOTAL: 0.5 mg/dL (ref 0.2–1.2)
BUN: 17 mg/dL (ref 7–25)
CHLORIDE: 104 mmol/L (ref 98–110)
CO2: 27 mmol/L (ref 20–32)
Calcium: 9 mg/dL (ref 8.6–10.3)
Creat: 0.97 mg/dL (ref 0.70–1.18)
GFR, EST AFRICAN AMERICAN: 87 mL/min/{1.73_m2} (ref >=60)
GFR, Est Non African American: 75 mL/min/{1.73_m2} (ref >=60)
Globulin: 2 g/dL (calc) (ref 1.9–3.7)
Glucose, Bld: 165 mg/dL — ABNORMAL HIGH (ref 65–99)
POTASSIUM: 4.3 mmol/L (ref 3.5–5.3)
SODIUM: 140 mmol/L (ref 135–146)
TOTAL PROTEIN: 5.8 g/dL — AB (ref 6.1–8.1)

## 2018-09-07 LAB — PSA: PSA: 1.1 ng/mL (ref <=4.0)

## 2018-09-07 LAB — CULTURE INDICATED

## 2018-09-17 ENCOUNTER — Encounter: Payer: Medicare Other | Admitting: Internal Medicine

## 2018-09-17 ENCOUNTER — Ambulatory Visit (INDEPENDENT_AMBULATORY_CARE_PROVIDER_SITE_OTHER): Payer: Medicare Other | Admitting: *Deleted

## 2018-09-17 DIAGNOSIS — I639 Cerebral infarction, unspecified: Secondary | ICD-10-CM

## 2018-09-18 NOTE — Progress Notes (Signed)
[facility] Summary Report / Loop Recorder 

## 2018-09-22 ENCOUNTER — Other Ambulatory Visit: Payer: Self-pay | Admitting: Internal Medicine

## 2018-09-22 ENCOUNTER — Other Ambulatory Visit: Payer: Self-pay | Admitting: Adult Health

## 2018-09-25 ENCOUNTER — Encounter: Payer: Medicare Other | Admitting: Gastroenterology

## 2018-10-02 ENCOUNTER — Ambulatory Visit (INDEPENDENT_AMBULATORY_CARE_PROVIDER_SITE_OTHER): Payer: Medicare Other

## 2018-10-02 ENCOUNTER — Ambulatory Visit: Payer: Self-pay

## 2018-10-02 DIAGNOSIS — R319 Hematuria, unspecified: Secondary | ICD-10-CM | POA: Diagnosis not present

## 2018-10-02 DIAGNOSIS — N390 Urinary tract infection, site not specified: Secondary | ICD-10-CM

## 2018-10-02 NOTE — Progress Notes (Signed)
Patient presents to the office for a recheck of his urine. Patient states that he is not having any issues at all and feels fine.

## 2018-10-03 LAB — URINALYSIS W MICROSCOPIC + REFLEX CULTURE
BACTERIA UA: NONE SEEN /HPF
Bilirubin Urine: NEGATIVE
Glucose, UA: NEGATIVE
HGB URINE DIPSTICK: NEGATIVE
HYALINE CAST: NONE SEEN /LPF
KETONES UR: NEGATIVE
LEUKOCYTE ESTERASE: NEGATIVE
Nitrites, Initial: NEGATIVE
PH: 6 (ref 5.0–8.0)
PROTEIN: NEGATIVE
RBC / HPF: NONE SEEN /HPF (ref 0–2)
SPECIFIC GRAVITY, URINE: 1.015 (ref 1.001–1.03)
SQUAMOUS EPITHELIAL / LPF: NONE SEEN /HPF (ref <=5)
WBC, UA: NONE SEEN /HPF (ref 0–5)

## 2018-10-03 LAB — NO CULTURE INDICATED

## 2018-10-04 DIAGNOSIS — E119 Type 2 diabetes mellitus without complications: Secondary | ICD-10-CM | POA: Diagnosis not present

## 2018-10-04 LAB — HM DIABETES EYE EXAM

## 2018-10-09 ENCOUNTER — Ambulatory Visit: Payer: Self-pay

## 2018-10-10 LAB — CUP PACEART REMOTE DEVICE CHECK
Implantable Pulse Generator Implant Date: 20180907
MDC IDC SESS DTM: 20191027034032

## 2018-10-22 ENCOUNTER — Encounter: Payer: Self-pay | Admitting: *Deleted

## 2018-10-22 ENCOUNTER — Ambulatory Visit (INDEPENDENT_AMBULATORY_CARE_PROVIDER_SITE_OTHER): Payer: Medicare Other

## 2018-10-22 DIAGNOSIS — I639 Cerebral infarction, unspecified: Secondary | ICD-10-CM | POA: Diagnosis not present

## 2018-10-22 NOTE — Progress Notes (Signed)
[facility] Summary Report / Loop Recorder 

## 2018-10-24 ENCOUNTER — Other Ambulatory Visit: Payer: Self-pay | Admitting: Internal Medicine

## 2018-10-24 ENCOUNTER — Ambulatory Visit (INDEPENDENT_AMBULATORY_CARE_PROVIDER_SITE_OTHER): Payer: Medicare Other | Admitting: *Deleted

## 2018-10-24 VITALS — BP 136/78 | HR 100 | Temp 97.5°F | Resp 16 | Ht 70.5 in | Wt 184.0 lb

## 2018-10-24 DIAGNOSIS — R42 Dizziness and giddiness: Secondary | ICD-10-CM | POA: Diagnosis not present

## 2018-10-24 MED ORDER — DEXAMETHASONE 0.5 MG PO TABS: ORAL_TABLET | ORAL | 0 refills | Status: DC

## 2018-10-24 MED ORDER — ALPRAZOLAM 0.5 MG PO TABS: ORAL_TABLET | ORAL | 0 refills | Status: DC

## 2018-10-24 MED ORDER — MECLIZINE HCL 25 MG PO TABS: ORAL_TABLET | ORAL | 0 refills | Status: DC

## 2018-10-24 NOTE — Progress Notes (Signed)
The patient and his son were advised to go to the ED, if his symptoms worsened.

## 2018-10-24 NOTE — Patient Instructions (Signed)
Patient is here for a NV to check his BP, due to a complaint of dizziness when rising and a full feeling in his head. He states he is having a lot of stress due to his wife's health. The patient's BP was 136/78 lying, 142/76 sitting and 156/78 standing.  Per Dr Melford Aase, the patient was informed he has sent in [place] and Dexamthasone for the dizziness, since his BP did not drop when standing. He will also send an RX for anxiety to the patient's pharmacy, due to stress and being unable to sleep. The patient and his son are aware.

## 2018-10-25 ENCOUNTER — Telehealth: Payer: Self-pay | Admitting: Internal Medicine

## 2018-10-25 NOTE — Telephone Encounter (Signed)
left voicemail for patient. Per patient request for a referral back to [facility] for increased dizziness, vertigo, irregular heartrate, feeling of fullness in his head. Patient is very anxious, is concerned that he maybe having TIA's. We have been unable to reach [facility] to request an office visit for you, their office is having telephone line issues. Sent a message to the referral coordinator also, but unsure if they are able to respond.  Left  patient phone number to [place] and encouraged him to continue to try their office and request a office visit. He is a current patient with Dr Krista Blue.  Our nurse visit from 10-24-18 is available for [facility] to review regarding these complaints. Advised to go to the ED if symptoms get worse.

## 2018-11-07 ENCOUNTER — Telehealth: Payer: Self-pay

## 2018-11-07 NOTE — Telephone Encounter (Signed)
Spoke w/ pt and requested that he send a manual transmission b/c his home monitor has not updated in at least 14 days.   

## 2018-11-20 ENCOUNTER — Ambulatory Visit: Payer: Medicare Other

## 2018-11-21 LAB — CUP PACEART REMOTE DEVICE CHECK
Date Time Interrogation Session: 20191129033711
Date Time Interrogation Session: 20200101043814
Implantable Pulse Generator Implant Date: 20180907
Implantable Pulse Generator Implant Date: 20180907

## 2018-11-23 NOTE — Progress Notes (Signed)
STROKE NEUROLOGY FOLLOW UP NOTE  NAME: Earl Earl [name] DOB: 1941-10-24  REASON FOR VISIT: stroke follow up HISTORY FROM: pt and wife and chart  Today we had Earl pleasure of seeing Earl Earl [name] in follow-up at our Neurology Clinic. Pt was accompanied by wife.   History Summary: Earl Earl [name] is a 78 y.o. male with history of diabetes and hypertension who presented with nausea and dizziness. 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 CTA head and neck unremarkable. EF 55-60%. LDL 87 and A1c 6.6. Earl [name] had history of questionable A. fib episode during colonoscopy in Earl past, had 30 day cardiac event monitoring showed no A. Fib. TEE and remarkable and Loop recorder placed. Discharge to CIR with aspirin and Plavix as well as Lipitor.   History 09/04/17: During Earl interval time, Earl Earl [name] has been doing well. While he was in CIR, he was found to have afib/aflutter episode on loop recorder. He was put on eliquis 5mg  bid and DAPT discontinued. Pt doing well still at outpt PT/OT for right sided ataxia. BP good at home, today in clinic 148/89. He did not check finger stick at home but check urine glucose always negative. Continued on eliquis and lipitor without side effects.   UPDATE 02/08/18: Earl Earl [name] is being seen today for a four-month follow-up and is accompanied by his wife.  He has been doing well overall with mild complaints of dizziness.  Dizziness does get worse with increasing fatigue.  Continues to take Eliquis without side effects of increasing bleeding or bruising.  Continues to take Lipitor without side effects of myalgias.  A1c 7.0 LDL 42 on 12/21/2017.  PCP managing diabetes and cholesterol.  Blood pressure at today's visit stable at 136/84.  Uses cane for long distance ambulation and to help with balance issues.  Continues to do at home PT exercises to help with balance.  Denies symptoms of sleep apnea such as  snoring, daytime sleepiness, or frequent napping.  Does have a complaint of excessive salivation but denies dysphasia.  This is new since his stroke but has been improving. Denies new or worsening stroke/TIA symptoms.  Interval history 11/26/2018: Earl [name] is being seen today per Earl [name] request due to concerns of increased dizziness, vertigo, head fullness, irregular heart rate and anxiety where Earl [name] is concerned for possible TIAs.  He is accompanied today by his son.  Earl [name] states that he has been experiencing this dizziness since his stroke but son and family members have noticed worsening in Earl past few months.  He states he experiences dizziness while sitting to standing, rapid eye or head movement, or attempting to concentrate/read for long period of time.  He states this can happen multiple times per day and lasts approximately 5 minutes.  He does continue to use his cane without any recent falls but son states at times when he is attempting to stand, he will have a difficult time balancing himself where it appears as though he may fall but fortunately has not done so at this time.  He was started on meclizine by his PCP but denies benefit.  He was also started on Xanax as he is not sleeping but has not noticed much benefit in his dizziness sensation.  He does state that his wife was recently diagnosed with cancer in 08/2018 for Earl third time and is currently undergoing radiation and chemo which has been placing a lot of increased stress outpatient and anxiety.  Son believes that  Earl symptoms have worsened since this time.  He does continue to take Eliquis without side effects of bleeding or bruising.  He has not followed up with cardiology since hospital discharge.  He continues to take atorvastatin without side effects myalgias.  Blood pressure today stable at 148/80.  He does not consistently monitor at home but states SBP typically 130s when he is at appointments.  Earl [name] denies chest pain, nausea,  headache, visual changes or any neurological symptoms.  No further concerns at this time.       REVIEW OF SYSTEMS: Full 14 system review of systems performed and notable only for those listed below and in HPI above, all others are negative:, Fatigue, ringing in ears, trouble swallowing, constipation, insomnia, frequent waking, back pain, walking difficulty, memory loss, dizziness, decreased concentration and nervous/anxious   Earl following represents Earl Earl [name]'s updated allergies and side effects list: Allergies  Allergen Reactions  . Penicillins Other (See Comments)    Earl neurologically relevant items on Earl Earl [name]'s problem list were reviewed on today's visit.  Neurologic Examination  A problem focused neurological exam (12 or more points of Earl single system neurologic examination, vital signs counts as 1 point, cranial nerves count for 8 points) was performed.  Blood pressure (!) 148/80, pulse 92, height 5' 10.5" (1.791 m), weight 191 lb (86.6 kg).  General - Well nourished, elderly African-American male, well developed, in no apparent distress.  Ophthalmologic - Sharp disc margins OU.  Cardiovascular - Irregular rate and rhythm with no murmur.  Mental Status -  Level of arousal and orientation to time, place, and person were intact. Language including expression, naming, repetition, comprehension was assessed and found intact. Fund of Knowledge was assessed and was intact.  Cranial Nerves II - XII - II - Visual field intact OU. III, IV, VI - Extraocular movements intact. V - Facial sensation intact bilaterally. VII - Facial movement intact bilaterally. VIII - Hearing & vestibular intact bilaterally, vertical nystagmus present. X - Palate elevates symmetrically. XI - Chin turning & shoulder shrug intact bilaterally. XII - Tongue protrusion intact.  Motor Strength - Earl Earl [name]'s strength was normal in all extremities and pronator drift was absent.  Bulk was normal  and fasciculations were absent.   Motor Tone - Muscle tone was assessed at Earl neck and appendages and was normal.  Reflexes - Earl Earl [name]'s reflexes were 2+ in all extremities and he had no pathological reflexes.  Sensory - Light touch, temperature/pinprick, vibration and proprioception, and Romberg testing were assessed and were normal.    Coordination - Earl Earl [name] had dysmetria at RUE and RLE ataxia.  Tremor was absent.  Gait and Station - ambulates short distances without cane, it is cane primarily to assist with balance, mild difficulty with tandem walk but able to raise on his toes and on his heels     Data reviewed: I personally reviewed Earl images and agree with Earl radiology interpretations.  Ct Angio Head W Or Wo Contrast Ct Angio Neck W Or Wo Contrast 07/26/2017 IMPRESSION: 1. Patent carotid and vertebral arteries. No dissection, aneurysm, or hemodynamically significant stenosis utilizing NASCET criteria. 2. Patent circle of Willis. No large vessel occlusion, aneurysm, or significant stenosis. 3. Right AICA distribution late acute/early subacute infarction. 4. No hemorrhage or abnormal enhancement in Earl brain. 5. Lipoma within strap muscles anterior to larynx. 6. Large nodule in left lobe of thyroid, stable from prior thyroid ultrasounds.  Ct Head Wo Contrast 07/25/2017 IMPRESSION: 1. Acute right cerebellar SCA  territory infarct. 2. No intracranial hemorrhage.  Mr Brain Wo Contrast 07/26/2017 IMPRESSION: 1. Acute ischemic superior right cerebellar infarct without significant mass effect. Associated mild petechial hemorrhage without frank hemorrhagic transformation. 2. Otherwise normal brain MRI.  TTE 07/26/2017 Study Conclusions - Left ventricle: Earl cavity size was normal. Wall thickness was increased in a pattern of mild LVH. Systolic function was normal. Earl estimated ejection fraction was in Earl range of 55% to 60%. Wall motion was normal; there were no regional  wall motion abnormalities. Features are consistent with a pseudonormal left ventricular filling pattern, with concomitant abnormal relaxation and increased filling pressure (grade 2 diastolic dysfunction). - Aortic valve: There was mild regurgitation. - Mitral valve: There was mild regurgitation. - Right ventricle: Earl cavity size was mildly dilated. - Pulmonary arteries: Systolic pressure was mildly increased. PA peak pressure: 39 mm Hg (S). Impressions: - Normal LV systolic function; moderate diastolic dysfunction; mild LVH; mild AI; mild MR; mild RVE; mild TR with mildly elevated pulmonary pressure.  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  Loop recorder placed at that time which has shown evidence of atrial fibrillation and therefore was started on Eliquis.  Vascular risk factors include atrial fibrillation, DM, HLD and HTN.  Earl [name] is being seen today per Earl [name] request due to new concerns of increased dizziness over Earl past few months.   Plan:  -Continue Eliquis (apixaban) daily  and lipitor  for secondary stroke prevention -CT head to rule out acute infarct or abnormality due to worsening dizziness and nystagmus -Reviewed with son and Earl [name] that his worsening of symptoms could be post stroke deficits that have worsened recently due to increased stress but is also recommended to follow-up with cardiologist for atrial fibrillation and to ensure cardiac conditions are not making dizziness worse -PCP for management of cholesterol, blood pressure and diabetes  -Cardiologist for atrial fibrillation and Eliquis management -Maintain strict control of hypertension with blood pressure goal below 130/90, diabetes with hemoglobin A1c goal below 6.5% and cholesterol with LDL cholesterol (bad cholesterol) goal below 70 mg/dL. I also advised Earl Earl [name] to eat a  healthy diet with plenty of whole grains, cereals, fruits and vegetables, exercise regularly and maintain ideal body weight.  Followup in Earl future with me in 3 months or will call earlier if needed  Greater than 50% time during this 25 minute consultation visit was spent on counseling and coordination of care about DM, atrial fibrillation and HTN (risk factors), review of possible etiologies behind increasing/worsening dizziness, discussion about risk benefit of anticoagulation and answering questions.   Earl Earl [name], AGNP-BC  [facility] [address] [place], [place] [address]  Phone [phone] Fax [phone]

## 2018-11-26 ENCOUNTER — Encounter: Payer: Self-pay | Admitting: Internal Medicine

## 2018-11-26 ENCOUNTER — Encounter: Payer: Self-pay | Admitting: Adult Health

## 2018-11-26 ENCOUNTER — Ambulatory Visit: Payer: Medicare Other | Admitting: Adult Health

## 2018-11-26 VITALS — BP 148/80 | HR 92 | Ht 70.5 in | Wt 191.0 lb

## 2018-11-26 DIAGNOSIS — R29818 Other symptoms and signs involving the nervous system: Secondary | ICD-10-CM

## 2018-11-26 DIAGNOSIS — I10 Essential (primary) hypertension: Secondary | ICD-10-CM | POA: Diagnosis not present

## 2018-11-26 DIAGNOSIS — I63441 Cerebral infarction due to embolism of right cerebellar artery: Secondary | ICD-10-CM

## 2018-11-26 DIAGNOSIS — E1159 Type 2 diabetes mellitus with other circulatory complications: Secondary | ICD-10-CM | POA: Diagnosis not present

## 2018-11-26 DIAGNOSIS — R42 Dizziness and giddiness: Secondary | ICD-10-CM

## 2018-11-26 DIAGNOSIS — E785 Hyperlipidemia, unspecified: Secondary | ICD-10-CM

## 2018-11-26 DIAGNOSIS — I480 Paroxysmal atrial fibrillation: Secondary | ICD-10-CM | POA: Diagnosis not present

## 2018-11-26 DIAGNOSIS — H5500 Unspecified nystagmus: Secondary | ICD-10-CM

## 2018-11-26 NOTE — Patient Instructions (Signed)
Recommend Adult Low Dose Aspirin or  coated  Aspirin 81 mg daily  To reduce risk of Colon Cancer 20 %,  Skin Cancer 26 % ,  Melanoma 46%  and  Pancreatic cancer 60% +++++++++++++++++++++++++ Vitamin D goal  is between 70-100.  Please make sure that you are taking your Vitamin D as directed.  It is very important as a natural anti-inflammatory  helping hair, skin, and nails, as well as reducing stroke and heart attack risk.  It helps your bones and helps with mood. It also decreases numerous cancer risks so please take it as directed.  Low Vit D is associated with a 200-300% higher risk for CANCER  and 200-300% higher risk for HEART   ATTACK  &  STROKE.   ...................................... It is also associated with higher death rate at younger ages,  autoimmune diseases like Rheumatoid arthritis, Lupus, Multiple Sclerosis.    Also many other serious conditions, like depression, Alzheimer's Dementia, infertility, muscle aches, fatigue, fibromyalgia - just to name a few. ++++++++++++++++++++ Recommend the book "The END of DIETING" by Dr Joel Fuhrman  & the book "The END of DIABETES " by Dr Joel Fuhrman At [employer] - get book & Audio CD's    Being diabetic has a  300% increased risk for heart attack, stroke, cancer, and alzheimer- type vascular dementia. It is very important that you work harder with diet by avoiding all foods that are white. Avoid white rice (brown & wild rice is OK), white potatoes (sweetpotatoes in moderation is OK), White bread or wheat bread or anything made out of white flour like bagels, donuts, rolls, buns, biscuits, cakes, pastries, cookies, pizza crust, and pasta (made from white flour & egg whites) - vegetarian pasta or spinach or wheat pasta is OK. Multigrain breads like Arnold's or Pepper[employer], or multigrain sandwich thins or flatbreads.  Diet, exercise and weight loss can reverse and cure diabetes in the early stages.  Diet, exercise and weight loss is  very important in the control and prevention of complications of diabetes which affects every system in your body, ie. Brain - dementia/stroke, eyes - glaucoma/blindness, heart - heart attack/heart failure, kidneys - dialysis, stomach - gastric paralysis, intestines - malabsorption, nerves - severe painful neuritis, circulation - gangrene & loss of a leg(s), and finally cancer and Alzheimers.    I recommend avoid fried & greasy foods,  sweets/candy, white rice (brown or wild rice or Quinoa is OK), white potatoes (sweet potatoes are OK) - anything made from white flour - bagels, doughnuts, rolls, buns, biscuits,white and wheat breads, pizza crust and traditional pasta made of white flour & egg white(vegetarian pasta or spinach or wheat pasta is OK).  Multi-grain bread is OK - like multi-grain flat bread or sandwich thins. Avoid alcohol in excess. Exercise is also important.    Eat all the vegetables you want - avoid meat, especially red meat and dairy - especially cheese.  Cheese is the most concentrated form of trans-fats which is the worst thing to clog up our arteries. Veggie cheese is OK which can be found in the fresh produce section at [employer] or [employer] or [employer]  +++++++++++++++++++++ DASH Eating Plan  DASH stands for "Dietary Approaches to Stop Hypertension."   The DASH eating plan is a healthy eating plan that has been shown to reduce high blood pressure (hypertension). Additional health benefits may include reducing the risk of type 2 diabetes mellitus, heart disease, and stroke. The DASH eating plan may also   help with weight loss. WHAT DO I NEED TO KNOW ABOUT THE DASH EATING PLAN? For the DASH eating plan, you will follow these general guidelines:  Choose foods with a percent daily value for sodium of less than 5% (as listed on the food label).  Use salt-free seasonings or herbs instead of table salt or sea salt.  Check with your health care provider or pharmacist before  using salt substitutes.  Eat lower-sodium products, often labeled as "lower sodium" or "no salt added."  Eat fresh foods.  Eat more vegetables, fruits, and low-fat dairy products.  Choose whole grains. Look for the word "whole" as the first word in the ingredient list.  Choose fish   Limit sweets, desserts, sugars, and sugary drinks.  Choose heart-healthy fats.  Eat veggie cheese   Eat more home-cooked food and less restaurant, buffet, and fast food.  Limit fried foods.  Cook foods using methods other than frying.  Limit canned vegetables. If you do use them, rinse them well to decrease the sodium.  When eating at a restaurant, ask that your food be prepared with less salt, or no salt if possible.                      WHAT FOODS CAN I EAT? Read Dr Joel Fuhrman's books on The End of Dieting & The End of Diabetes  Grains Whole grain or whole wheat bread. Brown rice. Whole grain or whole wheat pasta. Quinoa, bulgur, and whole grain cereals. Low-sodium cereals. Corn or whole wheat flour tortillas. Whole grain cornbread. Whole grain crackers. Low-sodium crackers.  Vegetables Fresh or frozen vegetables (raw, steamed, roasted, or grilled). Low-sodium or reduced-sodium tomato and vegetable juices. Low-sodium or reduced-sodium tomato sauce and paste. Low-sodium or reduced-sodium canned vegetables.   Fruits All fresh, canned (in natural juice), or frozen fruits.  Protein Products  All fish and seafood.  Dried beans, peas, or lentils. Unsalted nuts and seeds. Unsalted canned beans.  Dairy Low-fat dairy products, such as skim or 1% milk, 2% or reduced-fat cheeses, low-fat ricotta or cottage cheese, or plain low-fat yogurt. Low-sodium or reduced-sodium cheeses.  Fats and Oils Tub margarines without trans fats. Light or reduced-fat mayonnaise and salad dressings (reduced sodium). Avocado. Safflower, olive, or canola oils. Natural peanut or almond butter.  Other Unsalted popcorn  and pretzels. The items listed above may not be a complete list of recommended foods or beverages. Contact your dietitian for more options.  +++++++++++++++  WHAT FOODS ARE NOT RECOMMENDED? Grains/ White flour or wheat flour White bread. White pasta. White rice. Refined cornbread. Bagels and croissants. Crackers that contain trans fat.  Vegetables  Creamed or fried vegetables. Vegetables in a . Regular canned vegetables. Regular canned tomato sauce and paste. Regular tomato and vegetable juices.  Fruits Dried fruits. Canned fruit in light or heavy syrup. Fruit juice.  Meat and Other Protein Products Meat in general - RED meat & White meat.  Fatty cuts of meat. Ribs, chicken wings, all processed meats as bacon, sausage, bologna, salami, fatback, hot dogs, bratwurst and packaged luncheon meats.  Dairy Whole or 2% milk, cream, half-and-half, and cream cheese. Whole-fat or sweetened yogurt. Full-fat cheeses or blue cheese. Non-dairy creamers and whipped toppings. Processed cheese, cheese spreads, or cheese curds.  Condiments Onion and garlic salt, seasoned salt, table salt, and sea salt. Canned and packaged gravies. Worcestershire sauce. Tartar sauce. Barbecue sauce. Teriyaki sauce. Soy sauce, including reduced sodium. Steak sauce. Fish sauce. Oyster sauce. Cocktail   sauce. Horseradish. Ketchup and mustard. Meat flavorings and tenderizers. Bouillon cubes. Hot sauce. Tabasco sauce. Marinades. Taco seasonings. Relishes.  Fats and Oils Butter, stick margarine, lard, shortening and bacon fat. Coconut, palm kernel, or palm oils. Regular salad dressings.  Pickles and olives. Salted popcorn and pretzels.  The items listed above may not be a complete list of foods and beverages to avoid.   

## 2018-11-26 NOTE — Patient Instructions (Signed)
Continue Eliquis (apixaban) daily  and lipitor  for secondary stroke prevention  Continue to follow up with PCP regarding cholesterol, blood pressure and diabetes management   We will do CT scan to rule out possible new stroke  Recommend to see cardiologist for atrial fibrillation follow up. As long as everything looks okay from cardiac standpoint, recommend that you start therapy. Orders placed to start this at neuro rehab.   Continue to monitor blood pressure at home  Maintain strict control of hypertension with blood pressure goal below 130/90, diabetes with hemoglobin A1c goal below 6.5% and cholesterol with LDL cholesterol (bad cholesterol) goal below 70 mg/dL. I also advised the patient to eat a healthy diet with plenty of whole grains, cereals, fruits and vegetables, exercise regularly and maintain ideal body weight.  Followup in the future with me in 3 months or call earlier if needed       Thank you for coming to see [place] at [facility]. I hope we have been able to provide you high quality care today.  You may receive a patient satisfaction survey over the next few weeks. We would appreciate your feedback and comments so that we may continue to improve ourselves and the health of our patients.

## 2018-11-26 NOTE — Progress Notes (Signed)
This very nice 78 y.o. MBM presents for 6 month follow up with HTN, HLD, Pre-Diabetes and Vitamin D Deficiency. Today he's also c/o a penile glans/prepuce rash/discharge.     Patient is treated for HTN (1980) & BP has been controlled at home. Today's BP is at goal - 138/76.  Patient was hospitalized in Sept 2018 with a Rt Cerebellar CVA w/Rt Ataxia which has persisted. Patient has also been dx'd in the interim with pAfib by loop recorder and is on Eliquis.  Patient has had no complaints of any cardiac type chest pain, but does have occasional palpitations, no dyspnea / orthopnea / PND, but does have occas positional dizziness and noclaudication or dependent edema.     Patient was seen yesterday by Venancio Poisson, NP / Neurology yesterday & is scheduled for f/u Head CT.      Hyperlipidemia is controlled with diet & meds. Patient denies myalgias or other med SE's. 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 He monitors DU's which are consistently negative and is reticent to check CBG's.  Last A1c was not at goal: Lab Results  Component Value Date   HGBA1C 6.8 (H) 08/15/2018      Patient has been on Thyroid Replacement circa 2014 for goiter and hypothyroidism for goiter suppression.      Further, the patient also has history of Vitamin D Deficiency ("30" / 2008)  and supplements vitamin D without any suspected side-effects. Last vitamin D was at goal: Lab Results  Component Value Date   VD25OH 64 05/10/2018   Current Outpatient Medications on File Prior to Visit  Medication Sig  . ALPRAZolam (XANAX) 0.5 MG tablet Take 1/2 to 1 tablet 2 to 2 x /day as needed for Anxiety  . atorvastatin (LIPITOR)  40 MG tablet TAKE 1 TABLET BY MOUTH  DAILY AT 6 PM.  . Cholecalciferol (VITAMIN D PO) Take 5,000 Units by mouth daily.  . Cyanocobalamin (B-12) 50 MCG TABS Take 50 mcg by mouth daily.  [name] ELIQUIS 5 MG TABS tablet TAKE 1 TABLET BY MOUTH TWO  TIMES DAILY  . finasteride (PROSCAR) 5 MG tablet TAKE 1 TABLET BY MOUTH  EVERY DAY FOR PROSTATE  . glipiZIDE (GLUCOTROL) 5 MG tablet TAKE 1 TABLET BY MOUTH 2  TIMES DAILY BEFORE MEALS  . levothyroxine (SYNTHROID, LEVOTHROID) 50 MCG tablet TAKE 1 TABLET BY MOUTH  DAILY BEFORE BREAKFAST  . lisinopril (PRINIVIL,ZESTRIL) 5 MG tablet TAKE 1 TABLET BY MOUTH  DAILY  . Magnesium 250 MG TABS Take 250 mg by mouth daily.  . meclizine (ANTIVERT) 25 MG tablet Take 1/2 to 1 tablet 2 to 3 x  /day for Dizziness  / Vertigo  . metFORMIN (GLUCOPHAGE-XR) 500 MG 24 hr tablet TAKE 1 TABLET BY MOUTH 4  TIMES DAILY FOR DIABETES  . Multiple Vitamin (MULTIVITAMIN) capsule Take 1 capsule by mouth daily.    . vitamin C (ASCORBIC ACID) 500 MG tablet Take 500 mg by mouth daily.     No current facility-administered medications on file prior to visit.    Allergies  Allergen Reactions  . Penicillins Other (See Comments)   PMHx:  Past Medical History:  Diagnosis Date  . Adenomatous colon polyp   . Anal fissure   . BPH (benign prostatic hyperplasia)   . Diabetes mellitus ([place])   . Hyperlipidemia   . Hypertension   . Hypothyroidism   . Other testicular hypofunction   . Stroke ([place])   . Type II or unspecified type diabetes mellitus without mention of complication, not stated as uncontrolled    Immunization History  Administered Date(s) Administered  . DT 02/09/2015  . Influenza Split 08/07/2013  . Influenza, High Dose Seasonal PF 08/19/2014, 08/28/2015, 09/13/2016, 08/15/2017, 08/15/2018  . Pneumococcal Conjugate-13 06/10/2016  . Pneumococcal Polysaccharide-23 08/13/2008  . Td 11/22/2003   Past Surgical History:  Procedure Laterality Date  . INGUINAL HERNIA REPAIR  Bilateral    1968 and 2004  . LOOP RECORDER INSERTION N/A 07/28/2017   Procedure: LOOP RECORDER INSERTION;  Surgeon: Deboraha Sprang, MD;  Location: [facility];  Service: Cardiovascular;  Laterality: N/A;  . TEE WITHOUT CARDIOVERSION N/A 07/28/2017   Procedure: TRANSESOPHAGEAL ECHOCARDIOGRAM (TEE);  Surgeon: Sueanne Margarita, MD;  Location[facility];  Service: Cardiovascular;  Laterality: N/A;   FHx:    Reviewed / unchanged  SHx:    Reviewed / unchanged   Systems Review:  Constitutional: Denies fever, chills, wt changes, headaches, insomnia, fatigue, night sweats, change in appetite. Eyes: Denies redness, blurred vision, diplopia, discharge, itchy, watery eyes.  ENT: Denies discharge, congestion, post nasal drip, epistaxis, sore throat, earache, hearing loss, dental pain, tinnitus, vertigo, sinus pain, snoring.  CV: Denies chest pain, palpitations, irregular heartbeat, syncope, dyspnea, diaphoresis, orthopnea, PND, claudication or edema. Respiratory: denies cough, dyspnea, DOE, pleurisy, hoarseness, laryngitis, wheezing.  Gastrointestinal: Denies dysphagia, odynophagia, heartburn, reflux, water brash, abdominal pain or cramps, nausea, vomiting, bloating, diarrhea, constipation, hematemesis, melena, hematochezia  or hemorrhoids. Genitourinary: Denies dysuria, frequency, urgency, nocturia, hesitancy, discharge, hematuria or flank pain. Musculoskeletal: Denies arthralgias, myalgias, stiffness, jt. swelling, pain, limping or strain/sprain.  Skin: Denies pruritus, rash, hives, warts, acne, eczema or change in skin lesion(s). Neuro: No weakness, tremor, incoordination, spasms, paresthesia or pain. Psychiatric: Denies confusion, memory loss or sensory loss. Endo: Denies change in weight, skin or hair change.  Heme/Lymph: No excessive bleeding, bruising or enlarged lymph nodes.  Physical Exam  BP 138/76   Pulse 88   Temp 97.7 F (36.5 C)   Resp 16   Ht 5\' 11"  (1.803 m)   Wt 187 lb  12.8 oz (85.2 kg)   BMI 26.19 kg/m   Appears  well nourished, well groomed  and in no distress.  Eyes: PERRLA, EOMs, conjunctiva no swelling or erythema. Sinuses: No frontal/maxillary tenderness ENT/Mouth: EAC's clear, TM's nl w/o erythema, bulging. Nares clear w/o erythema, swelling, exudates. Oropharynx clear without erythema or exudates. Oral hygiene is good. Tongue normal, non obstructing. Hearing intact.  Neck: Supple. Thyroid not palpable. Car 2+/2+ without bruits, nodes or JVD. Chest: Respirations nl with BS clear & equal w/o rales, rhonchi, wheezing or stridor.  Cor: Heart sounds normal w/ regular rate and rhythm without sig. murmurs, gallops, clicks or rubs. Peripheral pulses normal and equal  without edema.  Abdomen: Soft & bowel sounds normal. Non-tender w/o guarding, rebound, hernias, masses or organomegaly.  GU:  apparent candidal glans & prepuce infection Lymphatics: Unremarkable.  Musculoskeletal: Full ROM all peripheral extremities, joint stability, 5/5 strength and ambulates with a cane.  Skin: Warm, dry without exposed rashes, lesions or ecchymosis apparent.  Neuro: Cranial nerves intact, reflexes equal bilaterally. Sensory-motor testing grossly  intact. Tendon reflexes grossly intact.  Pysch: Alert & oriented x 3.  Insight and judgement nl & appropriate. No ideations.  Assessment and Plan:  1. Essential hypertension  - Continue medication, monitor blood pressure at home.  - Continue DASH diet.  Reminder to go to the ER if any CP,  SOB, nausea, dizziness, severe HA, changes vision/speech.  - CBC with Differential/Platelet - COMPLETE METABOLIC PANEL WITH GFR - Magnesium - TSH  2. Hyperlipidemia, mixed  - Continue diet/meds, exercise,& lifestyle modifications.  - Continue monitor periodic cholesterol/liver & renal functions   - Lipid panel - TSH  3. Type 2 diabetes mellitus with stage 2 chronic kidney disease, without long-term current use of insulin (HCC)  -  Continue diet, exercise,  - lifestyle modifications.  - Monitor appropriate labs.  - Hemoglobin A1c - Insulin, random  4. Vitamin D deficiency  - Continue supplementation.  - VITAMIN D 25 Hydroxyl  5. Paroxysmal atrial fibrillation (HCC)  - TSH  6. History of CVA with residual deficit   7. Medication management  - CBC with Differential/Platelet - COMPLETE METABOLIC PANEL WITH GFR - Magnesium - Lipid panel - TSH - Hemoglobin A1c - Insulin, random - VITAMIN D 25 Hydroxyl       Discussed  regular exercise, BP monitoring, weight control to achieve/maintain BMI less than 25 and discussed med and SE's. Recommended labs to assess and monitor clinical status with further disposition pending results of labs. Over 30 minutes of exam, counseling, chart review was performed.

## 2018-11-27 ENCOUNTER — Other Ambulatory Visit: Payer: Self-pay | Admitting: *Deleted

## 2018-11-27 ENCOUNTER — Ambulatory Visit (INDEPENDENT_AMBULATORY_CARE_PROVIDER_SITE_OTHER): Payer: Medicare Other | Admitting: Internal Medicine

## 2018-11-27 ENCOUNTER — Telehealth: Payer: Self-pay | Admitting: Adult Health

## 2018-11-27 VITALS — BP 138/76 | HR 88 | Temp 97.7°F | Resp 16 | Ht 71.0 in | Wt 187.8 lb

## 2018-11-27 DIAGNOSIS — I10 Essential (primary) hypertension: Secondary | ICD-10-CM

## 2018-11-27 DIAGNOSIS — E559 Vitamin D deficiency, unspecified: Secondary | ICD-10-CM | POA: Diagnosis not present

## 2018-11-27 DIAGNOSIS — I6930 Unspecified sequelae of cerebral infarction: Secondary | ICD-10-CM

## 2018-11-27 DIAGNOSIS — E782 Mixed hyperlipidemia: Secondary | ICD-10-CM

## 2018-11-27 DIAGNOSIS — I480 Paroxysmal atrial fibrillation: Secondary | ICD-10-CM

## 2018-11-27 DIAGNOSIS — E049 Nontoxic goiter, unspecified: Secondary | ICD-10-CM

## 2018-11-27 DIAGNOSIS — Z79899 Other long term (current) drug therapy: Secondary | ICD-10-CM

## 2018-11-27 DIAGNOSIS — E1122 Type 2 diabetes mellitus with diabetic chronic kidney disease: Secondary | ICD-10-CM | POA: Diagnosis not present

## 2018-11-27 DIAGNOSIS — N182 Chronic kidney disease, stage 2 (mild): Secondary | ICD-10-CM

## 2018-11-27 MED ORDER — HYDROCORTISONE ACETATE 25 MG RE SUPP: RECTAL | 3 refills | Status: DC

## 2018-11-27 MED ORDER — FLUCONAZOLE 150 MG PO TABS: ORAL_TABLET | ORAL | 3 refills | Status: DC

## 2018-11-27 MED ORDER — HYDROCORTISONE 2.5 % RE CREA: TOPICAL_CREAM | RECTAL | 3 refills | Status: AC

## 2018-11-27 MED ORDER — HYDROCORTISONE 2.5 % RE CREA: TOPICAL_CREAM | RECTAL | 3 refills | Status: DC

## 2018-11-27 NOTE — Telephone Encounter (Signed)
lvm for pt to be aware I gave him GI phone number of [phone] and to give them a call if he has not heard in the next 2-3 business days.

## 2018-11-27 NOTE — Telephone Encounter (Signed)
[employer] Medicare order sent to GI. No auth they will reach out to the pt to schedule.  °

## 2018-11-28 LAB — CBC WITH DIFFERENTIAL/PLATELET
Absolute Monocytes: 504 cells/uL (ref 200–950)
Basophils Absolute: 22 cells/uL (ref 0–200)
Basophils Relative: 0.4 %
Eosinophils Absolute: 78 cells/uL (ref 15–500)
Eosinophils Relative: 1.4 %
HCT: 37.7 % — ABNORMAL LOW (ref 38.5–50.0)
Hemoglobin: 13.1 g/dL — ABNORMAL LOW (ref 13.2–17.1)
Lymphs Abs: 1977 cells/uL (ref 850–3900)
MCH: 29.4 pg (ref 27.0–33.0)
MCHC: 34.7 g/dL (ref 32.0–36.0)
MCV: 84.5 fL (ref 80.0–100.0)
MONOS PCT: 9 %
MPV: 10.6 fL (ref 7.5–12.5)
Neutro Abs: 3018 cells/uL (ref 1500–7800)
Neutrophils Relative %: 53.9 %
Platelets: 219 10*3/uL (ref 140–400)
RBC: 4.46 10*6/uL (ref 4.20–5.80)
RDW: 13.1 % (ref 11.0–15.0)
Total Lymphocyte: 35.3 %
WBC: 5.6 10*3/uL (ref 3.8–10.8)

## 2018-11-28 LAB — VITAMIN D 25 HYDROXY (VIT D DEFICIENCY, FRACTURES): Vit D, 25-Hydroxy: 70 ng/mL (ref 30–100)

## 2018-11-28 LAB — LIPID PANEL
Cholesterol: 120 mg/dL (ref <200)
HDL: 53 mg/dL (ref >40)
LDL Cholesterol (Calc): 53 mg/dL (calc)
Non-HDL Cholesterol (Calc): 67 mg/dL (calc) (ref <130)
Total CHOL/HDL Ratio: 2.3 (calc) (ref <5.0)
Triglycerides: 60 mg/dL (ref <150)

## 2018-11-28 LAB — INSULIN, RANDOM: Insulin: 11.2 u[IU]/mL (ref 2.0–19.6)

## 2018-11-28 LAB — COMPLETE METABOLIC PANEL WITH GFR
AG Ratio: 1.9 (calc) (ref 1.0–2.5)
ALT: 24 U/L (ref 9–46)
AST: 21 U/L (ref 10–35)
Albumin: 3.8 g/dL (ref 3.6–5.1)
Alkaline phosphatase (APISO): 67 U/L (ref 40–115)
BUN: 25 mg/dL (ref 7–25)
CO2: 24 mmol/L (ref 20–32)
Calcium: 9 mg/dL (ref 8.6–10.3)
Chloride: 108 mmol/L (ref 98–110)
Creat: 1.03 mg/dL (ref 0.70–1.18)
GFR, Est African American: 81 mL/min/{1.73_m2} (ref >=60)
GFR, Est Non African American: 70 mL/min/{1.73_m2} (ref >=60)
Globulin: 2 g/dL (calc) (ref 1.9–3.7)
Glucose, Bld: 183 mg/dL — ABNORMAL HIGH (ref 65–99)
Potassium: 4.1 mmol/L (ref 3.5–5.3)
Sodium: 143 mmol/L (ref 135–146)
TOTAL PROTEIN: 5.8 g/dL — AB (ref 6.1–8.1)
Total Bilirubin: 0.7 mg/dL (ref 0.2–1.2)

## 2018-11-28 LAB — HEMOGLOBIN A1C
Hgb A1c MFr Bld: 7 % of total Hgb — ABNORMAL HIGH (ref <5.7)
Mean Plasma Glucose: 154 (calc)
eAG (mmol/L): 8.5 (calc)

## 2018-11-28 LAB — MAGNESIUM: Magnesium: 1.7 mg/dL (ref 1.5–2.5)

## 2018-11-28 LAB — TSH: TSH: 0.78 mIU/L (ref 0.40–4.50)

## 2018-11-30 ENCOUNTER — Ambulatory Visit: Payer: Medicare Other | Attending: Adult Health | Admitting: Rehabilitative and Restorative Service Providers"

## 2018-11-30 ENCOUNTER — Encounter: Payer: Self-pay | Admitting: Rehabilitative and Restorative Service Providers"

## 2018-11-30 DIAGNOSIS — R2681 Unsteadiness on feet: Secondary | ICD-10-CM

## 2018-11-30 DIAGNOSIS — R42 Dizziness and giddiness: Secondary | ICD-10-CM

## 2018-11-30 DIAGNOSIS — M6281 Muscle weakness (generalized): Secondary | ICD-10-CM | POA: Diagnosis not present

## 2018-11-30 DIAGNOSIS — R2689 Other abnormalities of gait and mobility: Secondary | ICD-10-CM | POA: Insufficient documentation

## 2018-11-30 NOTE — Therapy (Signed)
[facility] [address] [place], [place], [address] Phone: [phone]   Fax:  [phone]  Physical Therapy Evaluation  Patient Details  Name: Earl [name] MRN: [id number] Date of Birth: 05/01/1941 Referring Provider (PT): Venancio Poisson, NP   Encounter Date: 11/30/2018  PT End of Session - 11/30/18 1621    Visit Number  1    Number of Visits  9    Date for PT Re-Evaluation  12/30/18    Authorization Type  [employer] medicare- 10th visit progress note    PT Start Time  0938    PT Stop Time  1018    PT Time Calculation (min)  40 min    Activity Tolerance  Patient tolerated treatment well    Behavior During Therapy  [facility] for tasks assessed/performed       Past Medical History:  Diagnosis Date  . Adenomatous colon polyp   . Anal fissure   . BPH (benign prostatic hyperplasia)   . Diabetes mellitus ([place])   . Hyperlipidemia   . Hypertension   . Hypothyroidism   . Other testicular hypofunction   . Stroke ([place])   . Type II or unspecified type diabetes mellitus without mention of complication, not stated as uncontrolled     Past Surgical History:  Procedure Laterality Date  . INGUINAL HERNIA REPAIR Bilateral    1968 and 2004  . LOOP RECORDER INSERTION N/A 07/28/2017   Procedure: LOOP RECORDER INSERTION;  Surgeon: Deboraha Sprang, MD;  Location: [facility];  Service: Cardiovascular;  Laterality: N/A;  . TEE WITHOUT CARDIOVERSION N/A 07/28/2017   Procedure: TRANSESOPHAGEAL ECHOCARDIOGRAM (TEE);  Surgeon: Sueanne Margarita, MD;  Location: [facility] ENDOSCOPY;  Service: Cardiovascular;  Laterality: N/A;    There were no vitals filed for this visit.   Subjective Assessment - 11/30/18 0942    Subjective  The patient reports a gradual onset of dizziness (he had mild dizziness after stroke, but it "is hanging around") worsening since his stroke last year.  He c/o dizziness with looking up and with turning his head  quickly.  He also notes fear of falling when in a busier environment.  His wife reports worsening walking and speech when he fatigues.  His wife notes that his condition has worsened in the past 3 weeks since she has been diagnosed with cancer.  She feels stress is making symptoms worse.     Pertinent History  Stroke 15 months ago, diabetes, a-fib.    Patient Stated Goals  "I don't have this whooziness."    Currently in Pain?  No/denies         [facility] PT Assessment - 11/30/18 0947      Assessment   Medical Diagnosis  Dizziness    Referring Provider (PT)  Venancio Poisson, NP    Onset Date/Surgical Date  --   worsening in the past month   Hand Dominance  Right    Prior Therapy  known to our clinic from prior physical therapy      Precautions   Precautions  Fall      Restrictions   Weight Bearing Restrictions  No      Balance Screen   Has the patient fallen in the past 6 months  No    Has the patient had a decrease in activity level because of a fear of falling?   Yes   due to whooziness   Is the patient reluctant to leave their home because of a  fear of falling?   No      Home Environment   Living Environment  Private residence    Living Arrangements  Spouse/significant other    Type of [place] to enter    Entrance Stairs-Number of Steps  3    [place]  One level    [place] - single point   quad tip   Additional Comments  Patient is indep on the steps      Prior Function   Level of Independence  Independent with basic ADLs;Independent with community mobility with device      Sensation   Light Touch  Impaired Detail   from diabetes; notes numbness in feet and hands     Transfers   Transfers  Sit to Stand;Stand to Sit    Sit to Stand  6: Modified independent (Device/Increase time);With upper extremity assist    Stand to Sit  7: Independent      Ambulation/Gait   Ambulation/Gait  Yes    Ambulation/Gait Assistance  6:  Modified independent (Device/Increase time)    Ambulation Distance (Feet)  250 Feet    Assistive device  Straight cane;None    Gait Pattern  Decreased stance time - right;Decreased stride length;Decreased dorsiflexion - right;Step-through pattern    Ambulation Surface  Level;Indoor    Gait velocity  2.31 ft/sec with SPC and 2.06 ft/sec without a device.     Gait Comments  --      Standardized Balance Assessment   Standardized Balance Assessment  Berg Balance Test      Berg Balance Test   Sit to Stand  Able to stand  independently using hands    Standing Unsupported  Able to stand safely 2 minutes   maintains forward limit of stability stance   Sitting with Back Unsupported but Feet Supported on Floor or Stool  Able to sit safely and securely 2 minutes    Stand to Sit  Sits safely with minimal use of hands    Transfers  Able to transfer safely, definite need of hands    Standing Unsupported with Eyes Closed  Able to stand 10 seconds safely    Standing Ubsupported with Feet Together  Able to place feet together independently and stand 1 minute safely    From Standing, Reach Forward with Outstretched Arm  Can reach forward >12 cm safely (5")    From Standing Position, Pick up Object from Floor  Able to pick up shoe safely and easily    From Standing Position, Turn to Look Behind Over each Shoulder  Turn sideways only but maintains balance    Turn 360 Degrees  Able to turn 360 degrees safely but slowly   increases "whooziness"  rated 5/10   Standing Unsupported, Alternately Place Feet on Step/Stool  Able to stand independently and safely and complete 8 steps in 20 seconds    Standing Unsupported, One Foot in Front  Able to take small step independently and hold 30 seconds    Standing on One Leg  Tries to lift leg/unable to hold 3 seconds but remains standing independently    Total Score  44    Berg comment:  44/56            Vestibular Assessment - 11/30/18 0951      Vestibular  Assessment   General Observation  At rest, no dizziness.  Patient calls it "ooziness, not dizziness".  Symptom Behavior   Type of Dizziness  Imbalance   sense of spinning with certain positions   Frequency of Dizziness  daily    Duration of Dizziness  seconds    Aggravating Factors  Turning head sideways;Turning head quickly;Activity in general;Looking up to the ceiling    Relieving Factors  Head stationary      Occulomotor Exam   Occulomotor Alignment  Abnormal   right ocular roll   Spontaneous  Absent    Gaze-induced  Absent    Smooth Pursuits  Saccades   note some saccades with smooth pursuits; asymmetry L field     Vestibulo-Occular Reflex   VOR 1 Head Only (x 1 viewing)  self regulated pace provokes a sensation of 6/10 "whooziness" when performed x 6 reps      Positional Testing   Dix-Hallpike  Dix-Hallpike Right;Dix-Hallpike Left    Sidelying Test  Sidelying Right;Sidelying Left    Horizontal Canal Testing  Horizontal Canal Right;Horizontal Canal Left      Dix-Hallpike Right   Dix-Hallpike Right Duration  Mild sensation of subjective dizziness.  No nystagmus noted.    Dix-Hallpike Right Symptoms  No nystagmus      Dix-Hallpike Left   Dix-Hallpike Left Duration  none    Dix-Hallpike Left Symptoms  No nystagmus      Sidelying Right   Sidelying Right Duration  Mild sensation of subjective dizziness    Sidelying Right Symptoms  No nystagmus      Sidelying Left   Sidelying Left Duration  none    Sidelying Left Symptoms  No nystagmus      Horizontal Canal Right   Horizontal Canal Right Duration  Mild sensation of subjective dizziness    Horizontal Canal Right Symptoms  Normal      Horizontal Canal Left   Horizontal Canal Left Duration  Mild sensation of subjective dizzinessNO          Objective measurements completed on examination: See above findings.      [facility] Adult PT Treatment/Exercise - 11/30/18 0947      Exercises   Exercises  Other Exercises     Other Exercises   initiated HEP for stretching/ noted need during gait activities.      Vestibular Treatment/Exercise - 11/30/18 1001      Vestibular Treatment/Exercise   Vestibular Treatment Provided  --            PT Education - 11/30/18 1621    Education Details  HEP: flexibility    Person(s) Educated  Patient;Spouse    Methods  Explanation;Demonstration;Handout    Comprehension  Returned demonstration;Verbalized understanding          PT Long Term Goals - 11/30/18 1622      PT LONG TERM GOAL #1   Title  The patient and his wife will verbalize understanding of HEP for gaze adaptation x 1 viewing, habituation (sit<>right sidelying), standing turns, and flexibility.    Time  4    Period  Weeks    Target Date  12/30/18      PT LONG TERM GOAL #2   Title  The patient will improve gait speed from 2.31 ft/sec to > or equal to 2.62 ft/sec to demo improving functional mobility for community ambulation.    Time  8    Period  Weeks    Target Date  12/30/18      PT LONG TERM GOAL #3   Title  The patient will improve Berg score from 44/56  to > or equal to 49/56 to demo reduced fall risk.    Time  4    Period  Weeks    Target Date  12/30/18      PT LONG TERM GOAL #4   Title  The patient will perform sit<>right sidelying without any reports of dizziness (notes mild subjective reports at eval).    Time  4    Period  Weeks    Target Date  12/30/18             Plan - 11/30/18 1625    Clinical Impression Statement  The patient is a 78 yo male known to our clinic from prior PT s/p cerebellar stroke returning today with increased frequency of dizziness.  The patient notes a h/o dizziness with looking up that seems consistent with BPPV, however only mild subjective report of dizziness with R sidelying and R dix hallpike without nystagmus viewed in room light.  VOR gaze x 1 does elicit sensation of "whooziness".  Patient also has dec'd flexibility per gait pattern and  dec'd high level balance noted per Berg score of 44/56.  The patient will benefit from PT to address deficits and promote improved mobility.     History and Personal Factors relevant to plan of care:  Stroke 15 months ago, diabetes, a-fib.    Clinical Presentation  Stable    Clinical Presentation due to:  patient wihtout falls and walks community distances mod indep    Clinical Decision Making  Low    Rehab Potential  Good    PT Frequency  2x / week   + eval   PT Duration  4 weeks    PT Treatment/Interventions  ADLs/Self Care Home Management;Canalith Repostioning;Therapeutic activities;Therapeutic exercise;Balance training;Neuromuscular re-education;Gait training;Stair training;Functional mobility training;Patient/family education;Manual techniques    PT Next Visit Plan  check home stretches, reassess for BPPV (anticipate R?), gaze x 1 viewing, habituation sit<>sidelying, standing turns, high level balance, gait.    Consulted and Agree with Plan of Care  Patient       Patient will benefit from skilled therapeutic intervention in order to improve the following deficits and impairments:  Abnormal gait, Dizziness, Decreased balance, Decreased mobility, Impaired flexibility  Visit Diagnosis: Unsteadiness on feet  Other abnormalities of gait and mobility  Muscle weakness (generalized)  Dizziness and giddiness     Problem List Patient Active Problem List   Diagnosis Date Noted  . CKD stage 2 due to type 2 diabetes mellitus ([place]) 12/20/2017  . Chronic anticoagulation 09/04/2017  . Labile blood glucose   . Hypoalbuminemia due to protein-calorie malnutrition ([place])   . Ataxia, post-stroke   . History of CVA with residual deficit   . Hypothyroidism 09/13/2016  . Medicare annual wellness visit, subsequent 08/28/2015  . 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 Vitamin D deficiency 04/29/2014  . Medication management 01/03/2014  . Type 2 diabetes mellitus ([place])   .  BPH (benign prostatic hyperplasia)   . 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 Anemia 11/07/2013  . Essential hypertension 11/07/2013  . Hyperlipidemia 11/07/2013  . Paroxysmal atrial fibrillation ([place]) 09/18/2012    Kaylany Tesoriero, PT 11/30/2018, 4:29 PM  [facility] [address] [place], [place], [address] Phone: [phone]   Fax:  [phone]  Name: Earl [name] MRN: [id number] Date of Birth: 07/15/1941

## 2018-11-30 NOTE — Patient Instructions (Signed)
Access Code: [email or web address]  URL: [email or web address]  Date: 11/30/2018  Prepared by: Rudell Cobb   Exercises Seated Hamstring Stretch with Chair - 2 reps - 1 sets - 30 seconds hold - 1x daily - 7x weekly Standing Gastroc Stretch - 2 reps - 1 sets - 30 seconds hold - 1x daily - 7x weekly

## 2018-12-02 ENCOUNTER — Encounter: Payer: Self-pay | Admitting: Internal Medicine

## 2018-12-02 DIAGNOSIS — E049 Nontoxic goiter, unspecified: Secondary | ICD-10-CM

## 2018-12-04 ENCOUNTER — Other Ambulatory Visit: Payer: Self-pay | Admitting: Adult Health

## 2018-12-05 ENCOUNTER — Ambulatory Visit
Admission: RE | Admit: 2018-12-05 | Discharge: 2018-12-05 | Disposition: A | Discharge status: Home / Self Care | Payer: Medicare Other | Source: Ambulatory Visit | Attending: Adult Health | Admitting: Adult Health

## 2018-12-05 DIAGNOSIS — R42 Dizziness and giddiness: Secondary | ICD-10-CM | POA: Diagnosis not present

## 2018-12-06 ENCOUNTER — Ambulatory Visit: Payer: Medicare Other | Admitting: Physical Therapy

## 2018-12-06 ENCOUNTER — Encounter: Payer: Self-pay | Admitting: Physical Therapy

## 2018-12-06 DIAGNOSIS — R2689 Other abnormalities of gait and mobility: Secondary | ICD-10-CM | POA: Diagnosis not present

## 2018-12-06 DIAGNOSIS — R2681 Unsteadiness on feet: Secondary | ICD-10-CM

## 2018-12-06 DIAGNOSIS — R42 Dizziness and giddiness: Secondary | ICD-10-CM | POA: Diagnosis not present

## 2018-12-06 DIAGNOSIS — M6281 Muscle weakness (generalized): Secondary | ICD-10-CM | POA: Diagnosis not present

## 2018-12-06 NOTE — Therapy (Signed)
[facility] [address] [place], [place], [address] Phone: [phone]   Fax:  [phone]  Physical Therapy Treatment  Patient Details  Name: Earl [name] MRN: [id number] Date of Birth: 02/17/1941 Referring Provider (PT): Venancio Poisson, NP   Encounter Date: 12/06/2018  PT End of Session - 12/06/18 1529    Visit Number  2    Number of Visits  9    Date for PT Re-Evaluation  12/30/18    Authorization Type  [employer] medicare- 10th visit progress note    PT Start Time  0848    PT Stop Time  0933    PT Time Calculation (min)  45 min    Activity Tolerance  Patient tolerated treatment well    Behavior During Therapy  [facility] for tasks assessed/performed       Past Medical History:  Diagnosis Date  . Adenomatous colon polyp   . Anal fissure   . BPH (benign prostatic hyperplasia)   . Diabetes mellitus ([place])   . Hyperlipidemia   . Hypertension   . Hypothyroidism   . Other testicular hypofunction   . Stroke ([place])   . Type II or unspecified type diabetes mellitus without mention of complication, not stated as uncontrolled     Past Surgical History:  Procedure Laterality Date  . INGUINAL HERNIA REPAIR Bilateral    1968 and 2004  . LOOP RECORDER INSERTION N/A 07/28/2017   Procedure: LOOP RECORDER INSERTION;  Surgeon: Deboraha Sprang, MD;  Location: [facility];  Service: Cardiovascular;  Laterality: N/A;  . TEE WITHOUT CARDIOVERSION N/A 07/28/2017   Procedure: TRANSESOPHAGEAL ECHOCARDIOGRAM (TEE);  Surgeon: Sueanne Margarita, MD;  Location: [facility] ENDOSCOPY;  Service: Cardiovascular;  Laterality: N/A;    There were no vitals filed for this visit.  Subjective Assessment - 12/06/18 0859    Subjective  Feeling okay-trying to walk without the cane.  Have not been able to do exercises as much due to wife's illness.    Pertinent History  Stroke 15 months ago, diabetes, a-fib.    Patient Stated Goals  "I don't have this  whooziness."    Currently in Pain?  No/denies                       [facility] Adult PT Treatment/Exercise - 12/06/18 0001      Ambulation/Gait   Ambulation/Gait  Yes    Ambulation/Gait Assistance  6: Modified independent (Device/Increase time)    Ambulation Distance (Feet)  100 Feet   x 2, 230 ft, 50 ft x 2 (no cane)   Assistive device  Straight cane;None   cane with rubber quad tip   Gait Pattern  Decreased stance time - right;Decreased stride length;Decreased dorsiflexion - right;Step-through pattern;Decreased step length - right   decreased R heelstrike   Ambulation Surface  Level;Indoor    Gait Comments  Cues for deliberate foot clearance, heelstrike on RLE with gait.      High Level Balance   High Level Balance Activities  Marching forwards;Marching backwards;Backward walking   Forward/back walking at counter, 3 reps   High Level Balance Comments  Upon standing, lateral wide BOS weightshifting x 10 reps, then stagger stance position forward/back weightshifting x 10 reps each, for anterior hip excursion and dorsifelxion stretch; heel/toe raises x 15 reps; step taps to 6" step, then 12" step, alternating legs with 1 UE support.  Forward step-ups x 10 reps with bilateral UE support  Exercises   Exercises  Other Exercises;Knee/Hip    Other Exercises   Reviewed standing gastroc stretch, reviewed seated hamstring stretch.  Pt able to return demo with cues.      Knee/Hip Exercises: Aerobic   Stepper  SciFit, Level 1.8, 4 extremities x 7 minutes for leg strengthening and flexibility      Vestibular Treatment/Exercise - 12/06/18 0905      Vestibular Treatment/Exercise   Vestibular Treatment Provided  Gaze    Gaze Exercises  X1 Viewing Horizontal;X1 Viewing Vertical      X1 Viewing Horizontal   Foot Position  seated    Time  --   30 seconds, 5 reps   Reps  5    Comments  Rates whooziness as 3-4/10      X1 Viewing Vertical   Foot Position  seated    Time  --    30 seconds   Reps  5    Comments  Reates whooziness as 7-8/10            PT Education - 12/06/18 1529    Education Details  HEP additions-see instructions    Person(s) Educated  Patient    Methods  Explanation;Demonstration;Handout    Comprehension  Verbalized understanding;Returned demonstration          PT Long Term Goals - 11/30/18 1622      PT LONG TERM GOAL #1   Title  The patient and his wife will verbalize understanding of HEP for gaze adaptation x 1 viewing, habituation (sit<>right sidelying), standing turns, and flexibility.    Time  4    Period  Weeks    Target Date  12/30/18      PT LONG TERM GOAL #2   Title  The patient will improve gait speed from 2.31 ft/sec to > or equal to 2.62 ft/sec to demo improving functional mobility for community ambulation.    Time  8    Period  Weeks    Target Date  12/30/18      PT LONG TERM GOAL #3   Title  The patient will improve Berg score from 44/56 to > or equal to 49/56 to demo reduced fall risk.    Time  4    Period  Weeks    Target Date  12/30/18      PT LONG TERM GOAL #4   Title  The patient will perform sit<>right sidelying without any reports of dizziness (notes mild subjective reports at eval).    Time  4    Period  Weeks    Target Date  12/30/18            Plan - 12/06/18 1529    Clinical Impression Statement  Initiated gaze stabilization exercises in sitting today, with pt rating "whooziness" as 7-8/10 with vertical head motions.  The feeling subsides once pt stops exercise and holds head steady.  Pt tolerates standing exercises and flexibility/strength exercises well.  Will continue to benefit from further skilled PT to address balance, mobility, vestibular issues.      Rehab Potential  Good    PT Frequency  2x / week   + eval   PT Duration  4 weeks    PT Treatment/Interventions  ADLs/Self Care Home Management;Canalith Repostioning;Therapeutic activities;Therapeutic exercise;Balance  training;Neuromuscular re-education;Gait training;Stair training;Functional mobility training;Patient/family education;Manual techniques    PT Next Visit Plan  Reassess for BPPV (anticipate R?), review gaze x 1 viewing, habituation sit<>sidelying, standing turns, high level balance, gait.  Consulted and Agree with Plan of Care  Patient       Patient will benefit from skilled therapeutic intervention in order to improve the following deficits and impairments:  Abnormal gait, Dizziness, Decreased balance, Decreased mobility, Impaired flexibility  Visit Diagnosis: Unsteadiness on feet     Problem List Patient Active Problem List   Diagnosis Date Noted  . 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 CKD stage 2 due to type 2 diabetes mellitus ([place]) 12/20/2017  . Chronic anticoagulation 09/04/2017  . Labile blood glucose   . Hypoalbuminemia due to protein-calorie malnutrition ([place])   . Ataxia, post-stroke   . History of CVA with residual deficit   . Hypothyroidism 09/13/2016  . Medicare annual wellness visit, subsequent 08/28/2015  . 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 Vitamin D deficiency 04/29/2014  . Medication management 01/03/2014  . Type 2 diabetes mellitus ([place])   . BPH (benign prostatic hyperplasia)   . 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 Anemia 11/07/2013  . Essential hypertension 11/07/2013  . Hyperlipidemia 11/07/2013  . Paroxysmal atrial fibrillation ([place]) 09/18/2012    Hoorain Kozakiewicz W. 12/06/2018, 3:34 PM Mady Haagensen, PT 12/06/18 3:35 PM Phone: [phone] Fax: [facility] [address] [place], [place], [address] Phone: [phone]   Fax:  [phone]  Name: Earl [name] MRN: [id number] Date of Birth: Mar 07, 1941

## 2018-12-06 NOTE — Patient Instructions (Addendum)
Access Code: [email or web address]  URL: [email or web address]  Date: 12/06/2018  Prepared by: Mady Haagensen   Exercises  Seated Hamstring Stretch with Chair - 2 reps - 1 sets - 30 seconds hold - 1x daily - 7x weekly  Standing Gastroc Stretch - 2 reps - 1 sets - 30 seconds hold - 1x daily - 7x weekly   Added 12/06/2018  Seated Gaze Stabilization with Head Nod - 5-10 reps - 1 sets - 2-3x daily - 7x weekly  Stride Stance Weight Shift - 10 reps - 2 sets - 1x daily - 7x weekly

## 2018-12-07 NOTE — Progress Notes (Signed)
I agree with the above plan 

## 2018-12-11 ENCOUNTER — Telehealth: Payer: Self-pay | Admitting: *Deleted

## 2018-12-11 NOTE — Telephone Encounter (Signed)
I spoke with the patient and discussed the CT head results from [facility] NP. The pt verbalized understanding and appreciation and had no questions.    Notes recorded by Venancio Poisson, NP on 12/07/2018 at 7:18 AM EST Please notify patient that his recent imaging did not show acute stroke or any other abnormalities.

## 2018-12-12 ENCOUNTER — Inpatient Hospital Stay: Payer: Medicare Other | Attending: Oncology

## 2018-12-12 DIAGNOSIS — E119 Type 2 diabetes mellitus without complications: Secondary | ICD-10-CM | POA: Insufficient documentation

## 2018-12-12 DIAGNOSIS — Z7984 Long term (current) use of oral hypoglycemic drugs: Secondary | ICD-10-CM | POA: Insufficient documentation

## 2018-12-12 DIAGNOSIS — I10 Essential (primary) hypertension: Secondary | ICD-10-CM | POA: Insufficient documentation

## 2018-12-12 DIAGNOSIS — D472 Monoclonal gammopathy: Secondary | ICD-10-CM | POA: Diagnosis not present

## 2018-12-12 LAB — CMP (CANCER CENTER ONLY)
ALT: 27 U/L (ref 0–44)
AST: 17 U/L (ref 15–41)
Albumin: 3.5 g/dL (ref 3.5–5.0)
Alkaline Phosphatase: 81 U/L (ref 38–126)
Anion gap: 9 (ref 5–15)
BUN: 16 mg/dL (ref 8–23)
CALCIUM: 9.2 mg/dL (ref 8.9–10.3)
CO2: 23 mmol/L (ref 22–32)
Chloride: 108 mmol/L (ref 98–111)
Creatinine: 1.18 mg/dL (ref 0.61–1.24)
GFR, Estimated: 59 mL/min — ABNORMAL LOW (ref >60)
Glucose, Bld: 188 mg/dL — ABNORMAL HIGH (ref 70–99)
Potassium: 3.9 mmol/L (ref 3.5–5.1)
Sodium: 140 mmol/L (ref 135–145)
Total Bilirubin: 0.5 mg/dL (ref 0.3–1.2)
Total Protein: 6.3 g/dL — ABNORMAL LOW (ref 6.5–8.1)

## 2018-12-12 LAB — CBC WITH DIFFERENTIAL (CANCER CENTER ONLY)
Abs Immature Granulocytes: 0.02 10*3/uL (ref 0.00–0.07)
BASOS ABS: 0 10*3/uL (ref 0.0–0.1)
Basophils Relative: 1 %
Eosinophils Absolute: 0.1 10*3/uL (ref 0.0–0.5)
Eosinophils Relative: 1 %
HEMATOCRIT: 37.2 % — AB (ref 39.0–52.0)
Hemoglobin: 12.7 g/dL — ABNORMAL LOW (ref 13.0–17.0)
Immature Granulocytes: 0 %
LYMPHS ABS: 1.1 10*3/uL (ref 0.7–4.0)
LYMPHS PCT: 18 %
MCH: 28.7 pg (ref 26.0–34.0)
MCHC: 34.1 g/dL (ref 30.0–36.0)
MCV: 84.2 fL (ref 80.0–100.0)
Monocytes Absolute: 0.8 10*3/uL (ref 0.1–1.0)
Monocytes Relative: 13 %
NRBC: 0 % (ref 0.0–0.2)
Neutro Abs: 4 10*3/uL (ref 1.7–7.7)
Neutrophils Relative %: 67 %
Platelet Count: 152 10*3/uL (ref 150–400)
RBC: 4.42 MIL/uL (ref 4.22–5.81)
RDW: 13.3 % (ref 11.5–15.5)
WBC Count: 6 10*3/uL (ref 4.0–10.5)

## 2018-12-13 LAB — KAPPA/LAMBDA LIGHT CHAINS
KAPPA, LAMDA LIGHT CHAIN RATIO: 0.96 (ref 0.26–1.65)
Kappa free light chain: 13.8 mg/L (ref 3.3–19.4)
Lambda free light chains: 14.4 mg/L (ref 5.7–26.3)

## 2018-12-14 ENCOUNTER — Encounter

## 2018-12-14 LAB — MULTIPLE MYELOMA PANEL, SERUM
Albumin SerPl Elph-Mcnc: 3.5 g/dL (ref 2.9–4.4)
Albumin/Glob SerPl: 1.6 (ref 0.7–1.7)
Alpha 1: 0.2 g/dL (ref 0.0–0.4)
Alpha2 Glob SerPl Elph-Mcnc: 0.6 g/dL (ref 0.4–1.0)
B-GLOBULIN SERPL ELPH-MCNC: 0.8 g/dL (ref 0.7–1.3)
Gamma Glob SerPl Elph-Mcnc: 0.6 g/dL (ref 0.4–1.8)
Globulin, Total: 2.2 g/dL (ref 2.2–3.9)
IgA: 280 mg/dL (ref 61–437)
IgG (Immunoglobin G), Serum: 647 mg/dL — ABNORMAL LOW (ref 700–1600)
IgM (Immunoglobulin M), Srm: 12 mg/dL — ABNORMAL LOW (ref 15–143)
M PROTEIN SERPL ELPH-MCNC: 0.2 g/dL — AB
Total Protein ELP: 5.7 g/dL — ABNORMAL LOW (ref 6.0–8.5)

## 2018-12-19 ENCOUNTER — Inpatient Hospital Stay (HOSPITAL_BASED_OUTPATIENT_CLINIC_OR_DEPARTMENT_OTHER): Payer: Medicare Other | Admitting: Oncology

## 2018-12-19 ENCOUNTER — Telehealth: Payer: Self-pay

## 2018-12-19 VITALS — BP 146/81 | HR 94 | Temp 98.0°F | Resp 18 | Ht 71.0 in | Wt 183.4 lb

## 2018-12-19 DIAGNOSIS — D472 Monoclonal gammopathy: Secondary | ICD-10-CM

## 2018-12-19 DIAGNOSIS — I10 Essential (primary) hypertension: Secondary | ICD-10-CM

## 2018-12-19 DIAGNOSIS — Z7984 Long term (current) use of oral hypoglycemic drugs: Secondary | ICD-10-CM | POA: Diagnosis not present

## 2018-12-19 DIAGNOSIS — E119 Type 2 diabetes mellitus without complications: Secondary | ICD-10-CM

## 2018-12-19 NOTE — Progress Notes (Signed)
Hematology and Oncology Follow Up Visit  Earl [name] [id number] [birth year] 77 y.o. 12/19/2018 9:19 AM Earl [name], MDMcKeown, [name], MD   Principle Diagnosis: 78 year old man with monoclonal gammopathy of undetermined significance (MGUS) diagnosed in 2004.  He presented with IgA lambda M spike of 0.3 g/dL.  No evidence of symptomatic multiple myeloma since that time.  Prior Therapy: Status post bone marrow biopsy on 02/14/2003 which showed 2% plasma cell infiltration.  Current therapy: Active surveillance.   Interim History:  Earl [name] returns today for repeat evaluation.  Since the last visit, he reports no major complaints.  He remains reasonably active ambulating without any difficulties.  He continues to attend activities of daily living without any falls or syncope.  His appetite has been reasonable without any weight loss.  He denies any bone pain or worsening neuropathy.  He denies any recent hospitalizations or recurrent infections.  He does not report any headaches or blurry vision or syncope.  He denied any confusion or lethargy.  He does not report any fevers, chills or sweats. He does not report any chest pain, palpitation orthopnea.  He denies any cough, wheezing or hemoptysis. He does not report any nausea, vomiting or early satiety.  Denies any changes in bowel habits.  He does not report any frequency urgency or hesitancy. He does not report any arthralgias or myalgias.  He denies any bleeding or clotting tendency. Rest of his review of systems is negative.  Medications: I have reviewed the patient's current medications.  Current Outpatient Medications  Medication Sig Dispense Refill  . ALPRAZolam (XANAX) 0.5 MG tablet Take 1/2 to 1 tablet 2 to 2 x /day as needed for Anxiety 90 tablet 0  . atorvastatin (LIPITOR) 40 MG tablet TAKE 1 TABLET BY MOUTH  DAILY AT 6 PM. 90 tablet 1  . Cholecalciferol (VITAMIN D PO) Take 5,000 Units by mouth daily.    . Cyanocobalamin  (B-12) 50 MCG TABS Take 50 mcg by mouth daily.    [name] ELIQUIS 5 MG TABS tablet TAKE 1 TABLET BY MOUTH TWO  TIMES DAILY 180 tablet 1  . finasteride (PROSCAR) 5 MG tablet TAKE 1 TABLET BY MOUTH  EVERY DAY FOR PROSTATE 90 tablet 3  . fluconazole (DIFLUCAN) 150 MG tablet Take 1 tablet weekly if needed for yeast infection 4 tablet 3  . glipiZIDE (GLUCOTROL) 5 MG tablet TAKE 1 TABLET BY MOUTH 2  TIMES DAILY BEFORE MEALS 180 tablet 1  . hydrocortisone (ANUSOL-HC) 2.5 % rectal cream Apply rectally  2  to 3 x /daily 30 g 3  . levothyroxine (SYNTHROID, LEVOTHROID) 50 MCG tablet TAKE 1 TABLET BY MOUTH  DAILY BEFORE BREAKFAST 90 tablet 1  . lisinopril (PRINIVIL,ZESTRIL) 5 MG tablet TAKE 1 TABLET BY MOUTH  DAILY 90 tablet 1  . Magnesium 250 MG TABS Take 250 mg by mouth daily.    . meclizine (ANTIVERT) 25 MG tablet Take 1/2 to 1 tablet 2 to 3 x  /day for Dizziness  / Vertigo 30 tablet 0  . metFORMIN (GLUCOPHAGE-XR) 500 MG 24 hr tablet TAKE 1 TABLET BY MOUTH 4  TIMES DAILY FOR DIABETES 360 tablet 3  . Multiple Vitamin (MULTIVITAMIN) capsule Take 1 capsule by mouth daily.      . vitamin C (ASCORBIC ACID) 500 MG tablet Take 500 mg by mouth daily.       No current facility-administered medications for this visit.      Allergies:  Allergies  Allergen Reactions  . Penicillins Other (See  Comments)    Past Medical History, Surgical history, Social history, and Family History were reviewed and updated.   Physical Exam: Blood pressure (!) 146/81, pulse 94, temperature 98 F (36.7 C), temperature source Oral, resp. rate 18, height '5\' 11"'$  (1.803 m), weight 183 lb 6.4 oz (83.2 kg), SpO2 100 %.   ECOG: 1    General appearance: Comfortable appearing without any discomfort Head: Normocephalic without any trauma Oropharynx: Mucous membranes are moist and pink without any thrush or ulcers. Eyes: Pupils are equal and round reactive to light. Lymph nodes: No cervical, supraclavicular, inguinal or axillary  lymphadenopathy.   Heart:regular rate and rhythm.  S1 and S2 without leg edema. Lung: Clear without any rhonchi or wheezes.  No dullness to percussion. Abdomin: Soft, nontender, nondistended with good bowel sounds.  No hepatosplenomegaly. Musculoskeletal: No joint deformity or effusion.  Full range of motion noted. Neurological: No deficits noted on motor, sensory and deep tendon reflex exam. Skin: No petechial rash or dryness.  Appeared moist.  .   Lab Results: Lab Results  Component Value Date   WBC 6.0 12/12/2018   HGB 12.7 (L) 12/12/2018   HCT 37.2 (L) 12/12/2018   MCV 84.2 12/12/2018   PLT 152 12/12/2018     Chemistry      Component Value Date/Time   NA 140 12/12/2018 1106   NA 140 10/25/2017 0753   K 3.9 12/12/2018 1106   K 4.2 10/25/2017 0753   CL 108 12/12/2018 1106   CL 101 10/23/2012 0802   CO2 23 12/12/2018 1106   CO2 21 (L) 10/25/2017 0753   BUN 16 12/12/2018 1106   BUN 13.1 10/25/2017 0753   CREATININE 1.18 12/12/2018 1106   CREATININE 1.03 11/27/2018 0929   CREATININE 1.1 10/25/2017 0753      Component Value Date/Time   CALCIUM 9.2 12/12/2018 1106   CALCIUM 9.1 10/25/2017 0753   ALKPHOS 81 12/12/2018 1106   ALKPHOS 85 10/25/2017 0753   AST 17 12/12/2018 1106   AST 17 10/25/2017 0753   ALT 27 12/12/2018 1106   ALT 19 10/25/2017 0753   BILITOT 0.5 12/12/2018 1106   BILITOT 0.48 10/25/2017 0753       Results for Earl [name] (MRN [id number]) as of 12/19/2018 09:12  Ref. Range 12/12/2018 11:05  M Protein SerPl Elph-Mcnc Latest Ref Range: Not Observed g/dL 0.2 (H)   Results for Earl [name] (MRN [id number]) as of 12/19/2018 09:12  Ref. Range 12/12/2018 11:05  IgG (Immunoglobin G), Serum Latest Ref Range: 700 - 1,600 mg/dL 647 (L)  IgM (Immunoglobulin M), Srm Latest Ref Range: 15 - 143 mg/dL 12 (L)  IgA Latest Ref Range: 61 - 437 mg/dL 280  Results for Earl [name] (MRN [id number]) as of 12/19/2018 09:12  Ref. Range 12/12/2018 11:06   Kappa free light chain Latest Ref Range: 3.3 - 19.4 mg/L 13.8  Lamda free light chains Latest Ref Range: 5.7 - 26.3 mg/L 14.4  Kappa, lamda light chain ratio Latest Ref Range: 0.26 - 1.65  0.96    Impression and Plan:   78 year old man with:  1. MGUS diagnosed in 2004.  He presented with IgA lambda subtype without any evidence of endorgan damage.  He has been on active surveillance since that time.   The natural course of this disease was updated today with the patient.  Laboratory data from December 12, 2018 were reviewed and continues to show no evidence of any changes.  His M spike remains very low, IgA level remains  normal with serum light chains are also within normal range.  He has no evidence of endorgan damage as evidenced by normal CBC and electrolytes.  At this time, I have recommended continue active surveillance as well as indication for restaging.  He develops any cytopenias or hypercalcemia or worsening protein studies, we will restage him with skeletal survey and a bone marrow biopsy.  2. Diabetes mellitus: No recent exacerbation noted at this time.  3. Hypertension: Blood pressure under reasonable control at this time without any issues.  4. Follow-up: Will be in 12 months for repeat protein studies.  15  minutes was spent with the patient face-to-face today.  More than 50% of time was dedicated to reviewing laboratory data, discussing disease status and treatment indications.   Zola Button, MD 1/29/20209:19 AM

## 2018-12-19 NOTE — Telephone Encounter (Signed)
Printed avs and calender ofe upcoming appointment. Per 1/28 los

## 2018-12-20 ENCOUNTER — Ambulatory Visit: Payer: Medicare Other | Admitting: Physical Therapy

## 2018-12-20 ENCOUNTER — Encounter: Payer: Self-pay | Admitting: Physical Therapy

## 2018-12-20 DIAGNOSIS — M6281 Muscle weakness (generalized): Secondary | ICD-10-CM | POA: Diagnosis not present

## 2018-12-20 DIAGNOSIS — R42 Dizziness and giddiness: Secondary | ICD-10-CM

## 2018-12-20 DIAGNOSIS — R2681 Unsteadiness on feet: Secondary | ICD-10-CM | POA: Diagnosis not present

## 2018-12-20 DIAGNOSIS — R2689 Other abnormalities of gait and mobility: Secondary | ICD-10-CM | POA: Diagnosis not present

## 2018-12-20 NOTE — Therapy (Signed)
[facility] [address] [place], [place], [address] Phone: [phone]   Fax:  [phone]  Physical Therapy Treatment  Patient Details  Name: Earl [name] MRN: [id number] Date of Birth: Sep 11, 1941 Referring Provider (PT): Venancio Poisson, NP   Encounter Date: 12/20/2018  PT End of Session - 12/20/18 1200    Visit Number  3    Number of Visits  9    Date for PT Re-Evaluation  12/30/18    Authorization Type  [employer] medicare- 10th visit progress note    PT Start Time  0805    PT Stop Time  0845    PT Time Calculation (min)  40 min    Equipment Utilized During Treatment  Gait belt    Activity Tolerance  Patient tolerated treatment well    Behavior During Therapy  [facility] for tasks assessed/performed       Past Medical History:  Diagnosis Date  . Adenomatous colon polyp   . Anal fissure   . BPH (benign prostatic hyperplasia)   . Diabetes mellitus ([place])   . Hyperlipidemia   . Hypertension   . Hypothyroidism   . Other testicular hypofunction   . Stroke ([place])   . Type II or unspecified type diabetes mellitus without mention of complication, not stated as uncontrolled     Past Surgical History:  Procedure Laterality Date  . INGUINAL HERNIA REPAIR Bilateral    1968 and 2004  . LOOP RECORDER INSERTION N/A 07/28/2017   Procedure: LOOP RECORDER INSERTION;  Surgeon: Deboraha Sprang, MD;  Location: [place] [facility];  Service: Cardiovascular;  Laterality: N/A;  . TEE WITHOUT CARDIOVERSION N/A 07/28/2017   Procedure: TRANSESOPHAGEAL ECHOCARDIOGRAM (TEE);  Surgeon: Sueanne Margarita, MD;  Location: [facility] ENDOSCOPY;  Service: Cardiovascular;  Laterality: N/A;    There were no vitals filed for this visit.  Subjective Assessment - 12/20/18 0808    Subjective  Been trying the exercises, but I think I did them longer than normal.  Brings on the whooziness feeling about an 8/10.    Pertinent History  Stroke 15 months ago,  diabetes, a-fib.    Patient Stated Goals  "I don't have this whooziness."    Currently in Pain?  No/denies                        Vestibular Treatment/Exercise - 12/20/18 0001      Vestibular Treatment/Exercise   Vestibular Treatment Provided  Gaze    Gaze Exercises  X1 Viewing Horizontal;X1 Viewing Vertical   seated-review of HEP, performed 3 sets 30 sec     X1 Viewing Horizontal   Foot Position  feet apart    Time  --   30 sec, 2 sets    Comments  Rates whooziness as 1-2/10      X1 Viewing Vertical   Time  --   30 sec, 2 sets   Comments  Rates whooziness as 3-4/10         Balance Exercises - 12/20/18 0826      Balance Exercises: Standing   Rockerboard  Anterior/posterior;Lateral;Head turns;10 reps   hip/ankle strategy, arm swing min guard assist   Balance Beam  Marching in place x 10 reps, alternating forward kicks x 10 reps, then alternating step taps x 10 reps, forward<>back stepping x 10 reps    Tandem Gait  Forward;Retro;Upper extremity support;Foam/compliant surface;3 reps    Retro Gait  Upper extremity support;5 reps  Forward/back in parallel bars   Sidestepping  Foam/compliant support;Upper extremity support;3 reps    Marching Limitations  Forwards and back marching along parallel bars, 3 reps         PT Education - 12/20/18 1159    Education Details  Clarified instructions for gaze stabilization:  perform 30-60 seconds, 3 sets horizontal/vertical; seated (can place target on the wall)    Person(s) Educated  Patient    Methods  Explanation;Demonstration;Handout    Comprehension  Verbalized understanding;Returned demonstration;Verbal cues required          PT Long Term Goals - 11/30/18 1622      PT LONG TERM GOAL #1   Title  The patient and his wife will verbalize understanding of HEP for gaze adaptation x 1 viewing, habituation (sit<>right sidelying), standing turns, and flexibility.    Time  4    Period  Weeks    Target Date   12/30/18      PT LONG TERM GOAL #2   Title  The patient will improve gait speed from 2.31 ft/sec to > or equal to 2.62 ft/sec to demo improving functional mobility for community ambulation.    Time  8    Period  Weeks    Target Date  12/30/18      PT LONG TERM GOAL #3   Title  The patient will improve Berg score from 44/56 to > or equal to 49/56 to demo reduced fall risk.    Time  4    Period  Weeks    Target Date  12/30/18      PT LONG TERM GOAL #4   Title  The patient will perform sit<>right sidelying without any reports of dizziness (notes mild subjective reports at eval).    Time  4    Period  Weeks    Target Date  12/30/18            Plan - 12/20/18 1200    Clinical Impression Statement  Reviewed HEP, with pt return demo understanding, needing cues for correct amount of time and to place target on wall (versus holding it in hands too long).  Overall, today, he rates whooziness feeling as 3-4/10 at most today.  Pt needs UE support with compliant surface and dynamic balance activities in parallel bars and would benefit from further skilled PT to address balance, mobility, vestibular issues.    Rehab Potential  Good    PT Frequency  2x / week   + eval   PT Duration  4 weeks    PT Treatment/Interventions  ADLs/Self Care Home Management;Canalith Repostioning;Therapeutic activities;Therapeutic exercise;Balance training;Neuromuscular re-education;Gait training;Stair training;Functional mobility training;Patient/family education;Manual techniques    PT Next Visit Plan  Reassess for BPPV (anticipate R?), habituation sit<>sidelying as needed; progress HEP with standing gaze stabilization, standing turns, high level balance, gait.    Consulted and Agree with Plan of Care  Patient       Patient will benefit from skilled therapeutic intervention in order to improve the following deficits and impairments:  Abnormal gait, Dizziness, Decreased balance, Decreased mobility, Impaired  flexibility  Visit Diagnosis: Unsteadiness on feet  Dizziness and giddiness     Problem List Patient Active Problem List   Diagnosis Date Noted  . 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 Chronic anticoagulation 09/04/2017  . Labile blood glucose   . Hypoalbuminemia due to protein-calorie malnutrition ([place])   . Ataxia, post-stroke   .  History of CVA with residual deficit   . Hypothyroidism 09/13/2016  . Medicare annual wellness visit, subsequent 08/28/2015  . 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 Vitamin D deficiency 04/29/2014  . Medication management 01/03/2014  . Type 2 diabetes mellitus ([place])   . BPH (benign prostatic hyperplasia)   . 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 Anemia 11/07/2013  . Essential hypertension 11/07/2013  . Hyperlipidemia 11/07/2013  . Paroxysmal atrial fibrillation ([place]) 09/18/2012    Maylie Ashton W. 12/20/2018, 12:03 PM  Frazier Butt., PT   [facility] [address] [place], [place], [address] Phone: [phone]   Fax:  [phone]  Name: Earl [name] MRN: [id number] Date of Birth: December 30, 1940

## 2018-12-21 ENCOUNTER — Ambulatory Visit (INDEPENDENT_AMBULATORY_CARE_PROVIDER_SITE_OTHER): Payer: Medicare Other | Admitting: Internal Medicine

## 2018-12-21 ENCOUNTER — Encounter: Payer: Self-pay | Admitting: Internal Medicine

## 2018-12-21 VITALS — BP 128/72 | HR 87 | Ht 71.0 in | Wt 187.0 lb

## 2018-12-21 DIAGNOSIS — I639 Cerebral infarction, unspecified: Secondary | ICD-10-CM | POA: Diagnosis not present

## 2018-12-21 NOTE — Progress Notes (Signed)
Patient Care Team: [name], MD as PCP - General (Internal Medicine) Inda Castle, MD (Inactive) as Consulting Physician (Gastroenterology) Wyatt Portela, MD as Consulting Physician (Oncology) Sharyne Peach, MD as Consulting Physician (Ophthalmology)   HPI  Earl [name] is a 78 y.o. male Referred back for dizziness.  This has been recurrent.  His deterioration is coincidental with his wife being diagnosed with her third cancer.  He has been doing physical therapy with significant interval improvement  Dizziness is provoked by hyperextension of the head and to a lesser degree by standing.  Denies shower intolerance but is using a shower chair.  He had a loop recorder implanted for cryptogenic stroke.  He said that atrial fibrillation is currently taking apixaban Date Cr K Hgb  1/20 1.18 3.9 12.7          DATE TEST EF   9/18  TEE 55-65% No CSE  9/18 Echo   55-60 %             Past Medical History:  Diagnosis Date  . Adenomatous colon polyp   . Anal fissure   . BPH (benign prostatic hyperplasia)   . Diabetes mellitus ([place])   . Hyperlipidemia   . Hypertension   . Hypothyroidism   . Other testicular hypofunction   . Stroke ([place])   . Type II or unspecified type diabetes mellitus without mention of complication, not stated as uncontrolled     Past Surgical History:  Procedure Laterality Date  . INGUINAL HERNIA REPAIR Bilateral    1968 and 2004  . LOOP RECORDER INSERTION N/A 07/28/2017   Procedure: LOOP RECORDER INSERTION;  Surgeon: Deboraha Sprang, MD;  Location: [place] CV LAB;  Service: Cardiovascular;  Laterality: N/A;  . TEE WITHOUT CARDIOVERSION N/A 07/28/2017   Procedure: TRANSESOPHAGEAL ECHOCARDIOGRAM (TEE);  Surgeon: Sueanne Margarita, MD;  Location[facility] ENDOSCOPY;  Service: Cardiovascular;  Laterality: N/A;    Current Meds  Medication Sig  . ALPRAZolam (XANAX) 0.5 MG tablet Take 1/2 to 1 tablet 2 to 2 x /day as needed for Anxiety  .  atorvastatin (LIPITOR) 40 MG tablet TAKE 1 TABLET BY MOUTH  DAILY AT 6 PM.  . Cholecalciferol (VITAMIN D PO) Take 5,000 Units by mouth daily.  . Cyanocobalamin (B-12) 50 MCG TABS Take 50 mcg by mouth daily.  [name] ELIQUIS 5 MG TABS tablet TAKE 1 TABLET BY MOUTH TWO  TIMES DAILY  . finasteride (PROSCAR) 5 MG tablet TAKE 1 TABLET BY MOUTH  EVERY DAY FOR PROSTATE  . fluconazole (DIFLUCAN) 150 MG tablet Take 1 tablet weekly if needed for yeast infection  . glipiZIDE (GLUCOTROL) 5 MG tablet TAKE 1 TABLET BY MOUTH 2  TIMES DAILY BEFORE MEALS  . hydrocortisone (ANUSOL-HC) 2.5 % rectal cream Apply rectally  2  to 3 x /daily  . levothyroxine (SYNTHROID, LEVOTHROID) 50 MCG tablet TAKE 1 TABLET BY MOUTH  DAILY BEFORE BREAKFAST  . lisinopril (PRINIVIL,ZESTRIL) 5 MG tablet TAKE 1 TABLET BY MOUTH  DAILY  . Magnesium 250 MG TABS Take 250 mg by mouth daily.  . meclizine (ANTIVERT) 25 MG tablet Take 1/2 to 1 tablet 2 to 3 x  /day for Dizziness  / Vertigo  . metFORMIN (GLUCOPHAGE-XR) 500 MG 24 hr tablet TAKE 1 TABLET BY MOUTH 4  TIMES DAILY FOR DIABETES  . Multiple Vitamin (MULTIVITAMIN) capsule Take 1 capsule by mouth daily.    . vitamin C (ASCORBIC ACID) 500 MG tablet Take 500 mg  by mouth daily.      Allergies  Allergen Reactions  . Penicillins Other (See Comments)      Review of Systems negative except from HPI and PMH  Physical Exam BP 128/72   Pulse 87   Ht 5\' 11"  (1.803 m)   Wt 187 lb (84.8 kg)   SpO2 98%   BMI 26.08 kg/m  Well developed and well nourished in no acute distress HENT normal E scleral and icterus clear Neck Supple JVP flat; carotids brisk and full Clear to ausculation Regular rate and rhythm, no murmurs gallops or rub Soft with active bowel sounds No clubbing cyanosis  Edema Alert and oriented, grossly normal motor and sensory function Skin Warm and Dry  ecg Sinus @ 87 18/08/38 LAD  Assessment and  Plan  ESUS  Dizziness  Atrial fibrillation  HTN  Orthostatic  hypertension    Dizziness is in part orthostatic.  Also aggravated by hyperextension of his neck.  As relates to the former, have suggested use of isometric contraction prior to standing.  Orthostatic vital signs today were most notable for orthostatic hypertension with this initial component of hypotension.  Physical therapy has been quite valuable.  His wife notes the association of his deterioration with the news of her third cancer.  No bleeding on anticoagulation.  We spent more than 50% of our >25 min visit in face to face counseling regarding the above      Current medicines are reviewed at length with the patient today .  The patient does not have concerns regarding medicines.

## 2018-12-21 NOTE — Patient Instructions (Signed)
Medication Instructions: Your physician recommends that you continue on your current medications as directed. Please refer to the Current Medication list given to you today.   Labwork: None ordered today  Procedures/Testing: None ordered today  Follow-Up:  Your physician recommends that you schedule a follow-up appointment in:   1 year with Dr. Caryl Comes   Any Additional Special Instructions Will Be Listed Below (If Applicable).     If you need a refill on your cardiac medications before your next appointment, please call your pharmacy.

## 2018-12-24 LAB — CUP PACEART INCLINIC DEVICE CHECK
Date Time Interrogation Session: 20200131201009
Implantable Pulse Generator Implant Date: 20180907

## 2018-12-26 ENCOUNTER — Ambulatory Visit: Payer: Medicare Other | Attending: Adult Health | Admitting: Rehabilitative and Restorative Service Providers"

## 2018-12-26 ENCOUNTER — Encounter: Payer: Self-pay | Admitting: Rehabilitative and Restorative Service Providers"

## 2018-12-26 DIAGNOSIS — M6281 Muscle weakness (generalized): Secondary | ICD-10-CM | POA: Diagnosis not present

## 2018-12-26 DIAGNOSIS — R2689 Other abnormalities of gait and mobility: Secondary | ICD-10-CM | POA: Insufficient documentation

## 2018-12-26 DIAGNOSIS — R42 Dizziness and giddiness: Secondary | ICD-10-CM | POA: Insufficient documentation

## 2018-12-26 DIAGNOSIS — R2681 Unsteadiness on feet: Secondary | ICD-10-CM | POA: Diagnosis not present

## 2018-12-26 NOTE — Therapy (Signed)
[facility] [address] [place], [place], [address] Phone: [phone]   Fax:  [phone]  Physical Therapy Treatment  Patient Details  Name: Earl [name] MRN: [id number] Date of Birth: 11/24/1940 Referring Provider (PT): Venancio Poisson, NP   Encounter Date: 12/26/2018  PT End of Session - 12/26/18 0942    Visit Number  4    Number of Visits  9    Date for PT Re-Evaluation  12/30/18    Authorization Type  [employer] medicare- 10th visit progress note    PT Start Time  0938    PT Stop Time  1018    PT Time Calculation (min)  40 min    Equipment Utilized During Treatment  Gait belt    Activity Tolerance  Patient tolerated treatment well    Behavior During Therapy  [facility] for tasks assessed/performed       Past Medical History:  Diagnosis Date  . Adenomatous colon polyp   . Anal fissure   . BPH (benign prostatic hyperplasia)   . Diabetes mellitus ([place])   . Hyperlipidemia   . Hypertension   . Hypothyroidism   . Other testicular hypofunction   . Stroke ([place])   . Type II or unspecified type diabetes mellitus without mention of complication, not stated as uncontrolled     Past Surgical History:  Procedure Laterality Date  . INGUINAL HERNIA REPAIR Bilateral    1968 and 2004  . LOOP RECORDER INSERTION N/A 07/28/2017   Procedure: LOOP RECORDER INSERTION;  Surgeon: Deboraha Sprang, MD;  Location: [facility];  Service: Cardiovascular;  Laterality: N/A;  . TEE WITHOUT CARDIOVERSION N/A 07/28/2017   Procedure: TRANSESOPHAGEAL ECHOCARDIOGRAM (TEE);  Surgeon: Sueanne Margarita, MD;  Location: [facility] ENDOSCOPY;  Service: Cardiovascular;  Laterality: N/A;    There were no vitals filed for this visit.  Subjective Assessment - 12/26/18 0941    Subjective  The patient notes he didn't get home until 7pm last night due his wife's treatment for cancer (she has an infusion).  He notes he didn't do exercises as much.  Looking up  still provokes dizziness.  He rates dizziness 5/10 when doing HEP (gaze).    Pertinent History  Stroke 15 months ago, diabetes, a-fib.    Patient Stated Goals  "I don't have this whooziness."    Currently in Pain?  No/denies             Vestibular Assessment - 12/26/18 0944      Vestibular Assessment   General Observation  at rest, no dizziness; gets dizziness when initially getting up and turning head to the right and looking up.  "It goes away pretty quick."      Symptom Behavior   Type of Dizziness  Lightheadedness      Positional Testing   Dix-Hallpike  Dix-Hallpike Right;Dix-Hallpike Left    Horizontal Canal Testing  Horizontal Canal Right;Horizontal Canal Left      Dix-Hallpike Right   Dix-Hallpike Right Duration  mild sensation of dizziness with return to sitting    Dix-Hallpike Right Symptoms  No nystagmus      Dix-Hallpike Left   Dix-Hallpike Left Duration  mild sensation of lightheadedness with return to sitting "I'm not dizzy"    Dix-Hallpike Left Symptoms  No nystagmus      Horizontal Canal Right   Horizontal Canal Right Duration  none    Horizontal Canal Right Symptoms  Normal      Horizontal Canal Left  Horizontal Canal Left Duration  none    Horizontal Canal Left Symptoms  Normal      Orthostatics   BP supine (x 5 minutes)  137/78   left arm, supine after 5 min (automatic cuff)   HR supine (x 5 minutes)  78    BP standing (after 1 minute)  144/83   got initial lightheadedness upon rising   HR standing (after 1 minute)  87    BP standing (after 3 minutes)  149/80    HR standing (after 3 minutes)  88    Orthostatics Comment  Patient notes a mild "lightheadedness" when transitioning to stand noting "it's just something I have to go through".                 [facility] Adult PT Treatment/Exercise - 12/26/18 0959      Ambulation/Gait   Ambulation/Gait  Yes    Ambulation/Gait Assistance  6: Modified independent (Device/Increase time)     Ambulation Distance (Feet)  150 Feet   x 4 reps   Assistive device  Straight cane    Gait Pattern  Decreased stance time - right;Decreased stride length;Decreased dorsiflexion - right;Step-through pattern;Decreased step length - right    Ambulation Surface  Level;Indoor    Gait Comments  Patient stood in lobby and began walking quickly with a right lean.  PT provided CGA for safety.  The patient had one episode of the left foot catching and self recovered.  PT emphasized right heel strike, slowed pace, and more deliberate steps.      Self-Care   Self-Care  Other Self-Care Comments    Other Self-Care Comments   Discussed slowing gait speed and thinking about deliverate heel strike during daily activities.       Exercises   Exercises  Other Exercises    Other Exercises   seated hamstring stretch adding passive gastroc stretch in sitting to stretch before gait.      Vestibular Treatment/Exercise - 12/26/18 1000      Vestibular Treatment/Exercise   Vestibular Treatment Provided  Habituation    Habituation Exercises  Seated Horizontal Head Turns;Seated Vertical Head Turns;Standing Horizontal Head Turns;Standing Vertical Head Turns    Gaze Exercises  X1 Viewing Horizontal      Seated Horizontal Head Turns   Number of Reps   10    Symptom Description   began with 5 reps and worked up to 10,  This movement provoke 5/10 "lightheadedness"      Seated Vertical Head Turns   Number of Reps   10    Symptom Description   3/10 with 10 reps of vertical head motion      Standing Horizontal Head Turns   Number of Reps   10    Symptom Description   with 4/10 symptoms of "lightheadedness"      Standing Vertical Head Turns   Number of Reps   10    Symptom Description   with 4/10 symptoms of "lightheadedness"      X1 Viewing Horizontal   Foot Position  feet apart near support surface in standing    Comments  rates whooziness 3/10.            PT Education - 12/26/18 1159    Education  Details  Added standing horizontal and vertical head motion.    Person(s) Educated  Patient    Methods  Explanation;Demonstration;Handout    Comprehension  Verbalized understanding;Returned demonstration  PT Long Term Goals - 11/30/18 1622      PT LONG TERM GOAL #1   Title  The patient and his wife will verbalize understanding of HEP for gaze adaptation x 1 viewing, habituation (sit<>right sidelying), standing turns, and flexibility.    Time  4    Period  Weeks    Target Date  12/30/18      PT LONG TERM GOAL #2   Title  The patient will improve gait speed from 2.31 ft/sec to > or equal to 2.62 ft/sec to demo improving functional mobility for community ambulation.    Time  8    Period  Weeks    Target Date  12/30/18      PT LONG TERM GOAL #3   Title  The patient will improve Berg score from 44/56 to > or equal to 49/56 to demo reduced fall risk.    Time  4    Period  Weeks    Target Date  12/30/18      PT LONG TERM GOAL #4   Title  The patient will perform sit<>right sidelying without any reports of dizziness (notes mild subjective reports at eval).    Time  4    Period  Weeks    Target Date  12/30/18            Plan - 12/26/18 1213    Clinical Impression Statement  The patient is progressing with gaze adaptation.  He notes more of a "lightheaded" sensation versus a true vertigo sensation.  PT checked orthostatics with no drop today (he noted at recent appointment his BP dropped with some lightheadedness noted).  Patient does get a "lightheaded" sensation when sitting up and moving to standing.  PT educated him to stop after standing to ensure he has his balance before walking.    PT Treatment/Interventions  ADLs/Self Care Home Management;Canalith Repostioning;Therapeutic activities;Therapeutic exercise;Balance training;Neuromuscular re-education;Gait training;Stair training;Functional mobility training;Patient/family education;Manual techniques    PT Next Visit  Plan  Check goals and extend date (due to not being seen for recommended duration).  Patient notes limitations due to schedule for his wife's treatment.  High level balance, dynamic gait, progression of habituation and adaptation.    Consulted and Agree with Plan of Care  Patient       Patient will benefit from skilled therapeutic intervention in order to improve the following deficits and impairments:  Abnormal gait, Dizziness, Decreased balance, Decreased mobility, Impaired flexibility  Visit Diagnosis: Unsteadiness on feet  Dizziness and giddiness  Other abnormalities of gait and mobility     Problem List Patient Active Problem List   Diagnosis Date Noted  . 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 CKD stage 2 due to type 2 diabetes mellitus ([place]) 12/20/2017  . Chronic anticoagulation 09/04/2017  . Labile blood glucose   . Hypoalbuminemia due to protein-calorie malnutrition ([place])   . Ataxia, post-stroke   . History of CVA with residual deficit   . Hypothyroidism 09/13/2016  . Medicare annual wellness visit, subsequent 08/28/2015  . 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 Vitamin D deficiency 04/29/2014  . Medication management 01/03/2014  . Type 2 diabetes mellitus ([place])   . BPH (benign prostatic hyperplasia)   . 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 Anemia 11/07/2013  . Essential hypertension 11/07/2013  . Hyperlipidemia 11/07/2013  . Paroxysmal atrial fibrillation ([place]) 09/18/2012    Katryn Plummer, PT 12/26/2018, 12:15 PM  [facility] [address]  [place], [place], [address] Phone: [phone]   Fax:  [phone]  Name: Earl [name] MRN: [id number] Date of Birth: 16-Jun-1941

## 2018-12-26 NOTE — Patient Instructions (Signed)
Access Code: [email or web address]  URL: [email or web address]  Date: 12/26/2018  Prepared by: Rudell Cobb   Exercises Seated Hamstring Stretch with Chair - 2 reps - 1 sets - 30 seconds hold - 1x daily - 7x weekly Standing Gastroc Stretch - 2 reps - 1 sets - 30 seconds hold - 1x daily - 7x weekly Stride Stance Weight Shift - 10 reps - 2 sets - 1x daily - 7x weekly Standing Gaze Stabilization with Head Rotation - 2 reps - 1 sets - 3x daily - 7x weekly Standing with Head Nod - 10 reps - 1 sets - 1x daily - 7x weekly Standing with Head Rotation - 10 reps - 1 sets - 1x daily - 7x weekly

## 2018-12-28 ENCOUNTER — Ambulatory Visit: Payer: Medicare Other | Admitting: Physical Therapy

## 2018-12-28 ENCOUNTER — Encounter: Payer: Self-pay | Admitting: Physical Therapy

## 2018-12-28 DIAGNOSIS — R2689 Other abnormalities of gait and mobility: Secondary | ICD-10-CM | POA: Diagnosis not present

## 2018-12-28 DIAGNOSIS — R2681 Unsteadiness on feet: Secondary | ICD-10-CM | POA: Diagnosis not present

## 2018-12-28 DIAGNOSIS — R42 Dizziness and giddiness: Secondary | ICD-10-CM | POA: Diagnosis not present

## 2018-12-28 DIAGNOSIS — M6281 Muscle weakness (generalized): Secondary | ICD-10-CM | POA: Diagnosis not present

## 2018-12-28 NOTE — Therapy (Signed)
[facility] [address] [place], [place], [address] Phone: [phone]   Fax:  [phone]  Physical Therapy Treatment  Patient Details  Name: Earl [name] MRN: [id number] Date of Birth: [birth year] Referring Provider (PT): Venancio Poisson, NP   Encounter Date: 12/28/2018  PT End of Session - 12/28/18 1612    Visit Number  5    Number of Visits  9    Date for PT Re-Evaluation  12/30/18    Authorization Type  [employer] medicare- 10th visit progress note    PT Start Time  0935    PT Stop Time  1015    PT Time Calculation (min)  40 min    Equipment Utilized During Treatment  Gait belt    Activity Tolerance  Patient tolerated treatment well    Behavior During Therapy  [facility] for tasks assessed/performed       Past Medical History:  Diagnosis Date  . Adenomatous colon polyp   . Anal fissure   . BPH (benign prostatic hyperplasia)   . Diabetes mellitus ([place])   . Hyperlipidemia   . Hypertension   . Hypothyroidism   . Other testicular hypofunction   . Stroke ([place])   . Type II or unspecified type diabetes mellitus without mention of complication, not stated as uncontrolled     Past Surgical History:  Procedure Laterality Date  . INGUINAL HERNIA REPAIR Bilateral    1968 and 2004  . LOOP RECORDER INSERTION N/A 07/28/2017   Procedure: LOOP RECORDER INSERTION;  Surgeon: Deboraha Sprang, MD;  Location: [facility];  Service: Cardiovascular;  Laterality: N/A;  . TEE WITHOUT CARDIOVERSION N/A 07/28/2017   Procedure: TRANSESOPHAGEAL ECHOCARDIOGRAM (TEE);  Surgeon: Sueanne Margarita, MD;  Location: [facility] ENDOSCOPY;  Service: Cardiovascular;  Laterality: N/A;    There were no vitals filed for this visit.  Subjective Assessment - 12/28/18 1607    Subjective  Doing okay with the exercises in standing; still would like to review them today.    Pertinent History  Stroke 15 months ago, diabetes, a-fib.    Patient Stated Goals   "I don't have this whooziness."    Currently in Pain?  No/denies                       [facility] Adult PT Treatment/Exercise - 12/28/18 0001      Standardized Balance Assessment   Standardized Balance Assessment  Berg Balance Test      Berg Balance Test   Sit to Stand  Able to stand without using hands and stabilize independently    Standing Unsupported  Able to stand safely 2 minutes    Sitting with Back Unsupported but Feet Supported on Floor or Stool  Able to sit safely and securely 2 minutes    Stand to Sit  Sits safely with minimal use of hands    Transfers  Able to transfer safely, minor use of hands    Standing Unsupported with Eyes Closed  Able to stand 10 seconds safely    Standing Ubsupported with Feet Together  Able to place feet together independently and stand 1 minute safely    From Standing, Reach Forward with Outstretched Arm  Can reach forward >12 cm safely (5")    From Standing Position, Pick up Object from Floor  Able to pick up shoe safely and easily    From Standing Position, Turn to Look Behind Over each Shoulder  Looks behind from both  sides and weight shifts well    Turn 360 Degrees  Able to turn 360 degrees safely but slowly    Standing Unsupported, Alternately Place Feet on Step/Stool  Able to complete >2 steps/needs minimal assist    Standing Unsupported, One Foot in Front  Able to plae foot ahead of the other independently and hold 30 seconds    Standing on One Leg  Tries to lift leg/unable to hold 3 seconds but remains standing independently    Total Score  46      Vestibular Treatment/Exercise - 12/28/18 0001      Vestibular Treatment/Exercise   Vestibular Treatment Provided  Habituation    Habituation Exercises  Standing Horizontal Head Turns;Standing Vertical Head Turns      Standing Horizontal Head Turns   Number of Reps   10    Symptom Description   with 3-4/10 symptoms of lightheadedness      Standing Vertical Head Turns   Number of  Reps   10    Symptom Description   with 2-3/10 symptoms of lightheadedness      X1 Viewing Horizontal   Foot Position  feet apart near support surface in standing    Reps  10      Reviewed exercises as part of HEP added last visit-pt return demo understanding.   Reviewed, at pt request, stagger stance forward and back weightshifting, with pt needing cues for knee extension and anterior hip excursion over front foot, 10-15 reps each leg position.   Balance Exercises - 12/28/18 0953      Balance Exercises: Standing   Tandem Gait  Forward;Retro;Upper extremity support;3 reps   tandem march in parallel bars   Turning  Right;Left;3 reps   Practiced head turns, then body turns   Marching Limitations  Forwards and back marching along parallel bars, 3 reps     Other Standing Exercises  Toe walking 3 laps in parallel bars, then heel walking x 3 laps in bars      Gait: Gait 100 ft, then 230 ft, 40 ft x 2 with cane with quadruped base, supervision, with cues for increased R step length, increased R heelstrike.  Progressed to utilizing visual target for upright posture, then head turns>body turns for changing directions for improved balance with turns and change of directions.       PT Long Term Goals - 12/28/18 1615      PT LONG TERM GOAL #1   Title  The patient and his wife will verbalize understanding of HEP for gaze adaptation x 1 viewing, habituation (sit<>right sidelying), standing turns, and flexibility.    Time  4    Period  Weeks      PT LONG TERM GOAL #2   Title  The patient will improve gait speed from 2.31 ft/sec to > or equal to 2.62 ft/sec to demo improving functional mobility for community ambulation.    Time  8    Period  Weeks      PT LONG TERM GOAL #3   Title  The patient will improve Berg score from 44/56 to > or equal to 49/56 to demo reduced fall risk.    Baseline  46/56 12/28/2018    Time  4    Period  Weeks    Status  Not Met      PT LONG TERM GOAL #4    Title  The patient will perform sit<>right sidelying without any reports of dizziness (notes mild subjective reports at eval).  Time  4    Period  Weeks            Plan - 12/28/18 1616    Clinical Impression Statement  Review of updates to HEP, with pt return demo understanding, with mild c/o lightheaded sensation with head movements in standing activities.  Berg Balance test checked today for STG check, but pt has not met that STG, as Earl [name] has only increased 44/56 to 46/56.  Pt continues to have difficulty with SLS activities on Berg; however, may not have been fully addressed due to time focused on vertigol-like symptoms and pt has not yet been seen for full 4 weeks of his POC (due to scheduling issues with his wife's medical appointments).  Pt appears to conitnue to be motivated for therapy and will continue to beneift from skilled PT to further address balance for improved functional mobility.    PT Treatment/Interventions  ADLs/Self Care Home Management;Canalith Repostioning;Therapeutic activities;Therapeutic exercise;Balance training;Neuromuscular re-education;Gait training;Stair training;Functional mobility training;Patient/family education;Manual techniques    PT Next Visit Plan  Check remaining LTGs (next week is wk 4 of 4, but he is scheduled past next week, so may need recert).  Single limb stance, High level balance, dynamic gait, progression of habituation, adaptation exercises    Consulted and Agree with Plan of Care  Patient       Patient will benefit from skilled therapeutic intervention in order to improve the following deficits and impairments:  Abnormal gait, Dizziness, Decreased balance, Decreased mobility, Impaired flexibility  Visit Diagnosis: Unsteadiness on feet  Other abnormalities of gait and mobility     Problem List Patient Active Problem List   Diagnosis Date Noted  . 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 CKD stage 2 due to type 2 diabetes mellitus ([place]) 12/20/2017  .  Chronic anticoagulation 09/04/2017  . Labile blood glucose   . Hypoalbuminemia due to protein-calorie malnutrition ([place])   . Ataxia, post-stroke   . History of CVA with residual deficit   . Hypothyroidism 09/13/2016  . Medicare annual wellness visit, subsequent 08/28/2015  . 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 Vitamin D deficiency 04/29/2014  . Medication management 01/03/2014  . Type 2 diabetes mellitus ([place])   . BPH (benign prostatic hyperplasia)   . 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 Anemia 11/07/2013  . Essential hypertension 11/07/2013  . Hyperlipidemia 11/07/2013  . Paroxysmal atrial fibrillation ([place]) 09/18/2012    Neelam Tiggs W. 12/28/2018, 4:23 PM  Frazier Butt., PT   [facility] [address] [place], [place], [address] Phone: [phone]   Fax:  [phone]  Name: Earl [name] MRN: [id number] Date of Birth: 1940/12/22

## 2018-12-31 ENCOUNTER — Ambulatory Visit: Payer: Medicare Other | Admitting: Rehabilitative and Restorative Service Providers"

## 2018-12-31 ENCOUNTER — Encounter: Payer: Self-pay | Admitting: Rehabilitative and Restorative Service Providers"

## 2018-12-31 DIAGNOSIS — R42 Dizziness and giddiness: Secondary | ICD-10-CM

## 2018-12-31 DIAGNOSIS — R2681 Unsteadiness on feet: Secondary | ICD-10-CM

## 2018-12-31 DIAGNOSIS — R2689 Other abnormalities of gait and mobility: Secondary | ICD-10-CM

## 2018-12-31 DIAGNOSIS — M6281 Muscle weakness (generalized): Secondary | ICD-10-CM | POA: Diagnosis not present

## 2018-12-31 NOTE — Therapy (Signed)
[facility] [address] [place], [place], [address] Phone: [phone]   Fax:  [phone]  Physical Therapy Treatment  Patient Details  Name: Earl [name] MRN: [id number] Date of Birth: [birth year] Referring Provider (PT): Venancio Poisson, NP   Encounter Date: 12/31/2018  PT End of Session - 12/31/18 9371    Visit Number  6    Number of Visits  9    Date for PT Re-Evaluation  12/30/18    Authorization Type  [employer] medicare- 10th visit progress note    PT Start Time  0934    PT Stop Time  1015    PT Time Calculation (min)  41 min    Equipment Utilized During Treatment  Gait belt    Activity Tolerance  Patient tolerated treatment well    Behavior During Therapy  [facility] for tasks assessed/performed       Past Medical History:  Diagnosis Date  . Adenomatous colon polyp   . Anal fissure   . BPH (benign prostatic hyperplasia)   . Diabetes mellitus ([place])   . Hyperlipidemia   . Hypertension   . Hypothyroidism   . Other testicular hypofunction   . Stroke ([place])   . Type II or unspecified type diabetes mellitus without mention of complication, not stated as uncontrolled     Past Surgical History:  Procedure Laterality Date  . INGUINAL HERNIA REPAIR Bilateral    1968 and 2004  . LOOP RECORDER INSERTION N/A 07/28/2017   Procedure: LOOP RECORDER INSERTION;  Surgeon: Deboraha Sprang, MD;  Location: [facility];  Service: Cardiovascular;  Laterality: N/A;  . TEE WITHOUT CARDIOVERSION N/A 07/28/2017   Procedure: TRANSESOPHAGEAL ECHOCARDIOGRAM (TEE);  Surgeon: Sueanne Margarita, MD;  Location: [facility] ENDOSCOPY;  Service: Cardiovascular;  Laterality: N/A;    There were no vitals filed for this visit.  Subjective Assessment - 12/31/18 0936    Subjective  The patient reports he stretched too much yesterday and has some mild soreness in R hamstring musculature.    Pertinent History  Stroke 15 months ago, diabetes, a-fib.     Patient Stated Goals  "I don't have this whooziness."    Currently in Pain?  No/denies                       [facility] Adult PT Treatment/Exercise - 12/31/18 1334      Ambulation/Gait   Ambulation/Gait  Yes    Ambulation/Gait Assistance  6: Modified independent (Device/Increase time);4: Min guard    Ambulation Distance (Feet)  300 Feet    Assistive device  Straight cane;None    Gait Pattern  Decreased stance time - right;Decreased stride length;Decreased dorsiflexion - right;Step-through pattern;Decreased step length - right    Ambulation Surface  Level;Indoor    Gait Comments  PT provided verbal cues and demo for greater heel strike, R knee extension @ heel strike and right weight shifting.      Therapeutic Activites    Therapeutic Activities  Other Therapeutic Activities    Other Therapeutic Activities  Stepping over 12" height surfaces for single leg stance in functional activities.       Neuro Re-ed    Neuro Re-ed Details   PT worked on figure 8 turns emphasizing improved R foot clearance, 360 degree turning in place (with patient taking 8+ steps), turning/ allowing lightheadedness to settle and then performing alternating LE foot taps.  Performed lateral rocking/reaching activities and then wide leg marching to  increase weight shift with turning.  Worked on visual spotting during turns in order to reduce "lightheaded" sensations.  Perofrmed rocker board with alternating step downs R and L sides dec'ing UE support.  Single leg stance activities.      Exercises   Exercises  Other Exercises    Other Exercises   Reviewed seated hamstring stretching.                  PT Long Term Goals - 12/31/18 1341      PT LONG TERM GOAL #1   Title  The patient and his wife will verbalize understanding of HEP for gaze adaptation x 1 viewing, habituation (sit<>right sidelying), standing turns, and flexibility.    Baseline  Pt is doing HEP regularly per reports.    Time  4     Period  Weeks    Status  Achieved      PT LONG TERM GOAL #2   Title  The patient will improve gait speed from 2.31 ft/sec to > or equal to 2.62 ft/sec to demo improving functional mobility for community ambulation.    Time  8    Period  Weeks    Status  On-going      PT LONG TERM GOAL #3   Title  The patient will improve Berg score from 44/56 to > or equal to 49/56 to demo reduced fall risk.    Baseline  Increased from 44/56 up to 46/56 12/28/2018.    Time  4    Period  Weeks    Status  Partially Met      PT LONG TERM GOAL #4   Title  The patient will perform sit<>right sidelying without any reports of dizziness (notes mild subjective reports at eval).    Time  4    Period  Weeks    Status  On-going       UPDATED LONG TERM GOALS:    PT Long Term Goals - 12/31/18 1348      PT LONG TERM GOAL #1   Title  The patient will be able to return demo progression of HEP for post d/c.     Time  4    Period  Weeks    Target Date  01/30/19      PT LONG TERM GOAL #2   Title  The patient will improve gait speed from 2.31 ft/sec to > or equal to 2.62 ft/sec to demo improving functional mobility for community ambulation.    Time  4    Period  Weeks    Status  Revised    Target Date  01/30/19      PT LONG TERM GOAL #3   Title  The patient will improve Berg score from 44/56 to > or equal to 49/56 to demo reduced fall risk.    Baseline  Increased from 44/56 up to 46/56 12/28/2018.    Time  4    Period  Weeks    Status  Revised    Target Date  01/30/19      PT LONG TERM GOAL #4   Title  The patient will perform sit<>right sidelying without any reports of dizziness (notes mild subjective reports at eval).    Time  4    Period  Weeks    Status  Revised    Target Date  01/30/19          Plan - 12/31/18 1344    Clinical Impression Statement  The  patient met LTG for home program.  He partially met Berg goal.  Gait speed was not reassessed today as PT focused on turns and single leg  stance activities.  The patient has been seen for 6 visits in plan of care.  Plan to continue remaining LTGs and partially met LTGs x 30 more days to further progress mobility.    PT Treatment/Interventions  ADLs/Self Care Home Management;Canalith Repostioning;Therapeutic activities;Therapeutic exercise;Balance training;Neuromuscular re-education;Gait training;Stair training;Functional mobility training;Patient/family education;Manual techniques    PT Next Visit Plan  Continue with turns, single leg stance, dynamic gait and outdoor surface negotiation.    Consulted and Agree with Plan of Care  Patient       Patient will benefit from skilled therapeutic intervention in order to improve the following deficits and impairments:  Abnormal gait, Dizziness, Decreased balance, Decreased mobility, Impaired flexibility  Visit Diagnosis: Unsteadiness on feet  Other abnormalities of gait and mobility  Dizziness and giddiness     Problem List Patient Active Problem List   Diagnosis Date Noted  . 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 CKD stage 2 due to type 2 diabetes mellitus ([place]) 12/20/2017  . Chronic anticoagulation 09/04/2017  . Labile blood glucose   . Hypoalbuminemia due to protein-calorie malnutrition ([place])   . Ataxia, post-stroke   . History of CVA with residual deficit   . Hypothyroidism 09/13/2016  . Medicare annual wellness visit, subsequent 08/28/2015  . 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 Vitamin D deficiency 04/29/2014  . Medication management 01/03/2014  . Type 2 diabetes mellitus ([place])   . BPH (benign prostatic hyperplasia)   . 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 Anemia 11/07/2013  . Essential hypertension 11/07/2013  . Hyperlipidemia 11/07/2013  . Paroxysmal atrial fibrillation ([place]) 09/18/2012    Nyemah Watton , PT 12/31/2018, 1:47 PM  [facility] [address] [place], [place], [address] Phone: [phone]   Fax:  [phone]  Name: Rob Mciver MRN: [id number] Date of Birth: [birth year]

## 2019-01-03 ENCOUNTER — Ambulatory Visit: Payer: Medicare Other | Admitting: Rehabilitative and Restorative Service Providers"

## 2019-01-03 ENCOUNTER — Encounter: Payer: Self-pay | Admitting: Rehabilitative and Restorative Service Providers"

## 2019-01-03 DIAGNOSIS — M6281 Muscle weakness (generalized): Secondary | ICD-10-CM

## 2019-01-03 DIAGNOSIS — R2689 Other abnormalities of gait and mobility: Secondary | ICD-10-CM

## 2019-01-03 DIAGNOSIS — R2681 Unsteadiness on feet: Secondary | ICD-10-CM

## 2019-01-03 DIAGNOSIS — R42 Dizziness and giddiness: Secondary | ICD-10-CM | POA: Diagnosis not present

## 2019-01-03 NOTE — Therapy (Signed)
[facility] [address] [place], [place], [address] Phone: [phone]   Fax:  [phone]  Physical Therapy Treatment  Patient Details  Name: Earl [name] MRN: [id number] Date of Birth: 1941-02-27 Referring Provider (PT): Venancio Poisson, NP   Encounter Date: 01/03/2019  PT End of Session - 01/03/19 0941    Visit Number  7    Number of Visits  9    Date for PT Re-Evaluation  01/30/19    Authorization Type  [employer] medicare- 10th visit progress note    PT Start Time  0935    PT Stop Time  1015    PT Time Calculation (min)  40 min    Equipment Utilized During Treatment  Gait belt    Activity Tolerance  Patient tolerated treatment well    Behavior During Therapy  [facility] for tasks assessed/performed       Past Medical History:  Diagnosis Date  . Adenomatous colon polyp   . Anal fissure   . BPH (benign prostatic hyperplasia)   . Diabetes mellitus ([place])   . Hyperlipidemia   . Hypertension   . Hypothyroidism   . Other testicular hypofunction   . Stroke ([place])   . Type II or unspecified type diabetes mellitus without mention of complication, not stated as uncontrolled     Past Surgical History:  Procedure Laterality Date  . INGUINAL HERNIA REPAIR Bilateral    1968 and 2004  . LOOP RECORDER INSERTION N/A 07/28/2017   Procedure: LOOP RECORDER INSERTION;  Surgeon: Deboraha Sprang, MD;  Location: [facility];  Service: Cardiovascular;  Laterality: N/A;  . TEE WITHOUT CARDIOVERSION N/A 07/28/2017   Procedure: TRANSESOPHAGEAL ECHOCARDIOGRAM (TEE);  Surgeon: Sueanne Margarita, MD;  Location: [facility] ENDOSCOPY;  Service: Cardiovascular;  Laterality: N/A;    There were no vitals filed for this visit.  Subjective Assessment - 01/03/19 0940    Subjective  The patient reports he continues with minimal lightheadedness rated a 2/10 that only lasts for seconds.   He is not sore in his hamstring anymore as compared to last  visit.    Pertinent History  Stroke 15 months ago, diabetes, a-fib.    Patient Stated Goals  "I don't have this whooziness."    Currently in Pain?  No/denies             Vestibular Assessment - 01/03/19 0958      Positional Testing   Sidelying Test  Sidelying Right;Sidelying Left      Sidelying Right   Sidelying Right Duration  trace amount of lightheadedness with return to sitting    Sidelying Right Symptoms  No nystagmus      Sidelying Left   Sidelying Left Duration  none    Sidelying Left Symptoms  No nystagmus               [facility] Adult PT Treatment/Exercise - 01/03/19 0959      Ambulation/Gait   Ambulation/Gait  Yes    Ambulation/Gait Assistance  6: Modified independent (Device/Increase time);4: Min guard    Ambulation Distance (Feet)  450 Feet    Assistive device  None;Straight cane    Gait Pattern  Decreased stance time - right;Decreased stride length;Decreased dorsiflexion - right;Step-through pattern;Decreased step length - right    Ambulation Surface  Level;Indoor    Gait velocity  3.15 ft/sec    Gait Comments  Gait activities emphasizing longer R stride, awareness to R knee extension at heel strike.  Worked  on upright posture iwht cues.  Performed anterior/posterior walking for direction changes, heel walking, toe walking with CGA for safety.      Neuro Re-ed    Neuro Re-ed Details   Standing kicking the right left anteriorly with a target of a tennis ball i(in a stockinette with theraband).    Standing alteranting LE foot taps to 12" and 18" surface.  Single leg stance activities working on [employer].      Exercises   Exercises  Other Exercises    Other Exercises   Seated/long sitting with overhead reaching for bilateral hamstring stretching, hip adductor/ circle sitting with overhead reaching.                  PT Long Term Goals - 01/03/19 0957      PT LONG TERM GOAL #1   Title  The patient will be able to return demo  progression of HEP for post d/c.     Time  4    Period  Weeks    Status  Revised    Target Date  01/30/19      PT LONG TERM GOAL #2   Title  The patient will improve gait speed from 2.31 ft/sec to > or equal to 2.62 ft/sec to demo improving functional mobility for community ambulation.    Baseline  3.15 ft/sec with SPC mod indep.    Time  4    Period  Weeks    Status  Achieved      PT LONG TERM GOAL #3   Title  The patient will improve Berg score from 44/56 to > or equal to 49/56 to demo reduced fall risk.    Baseline  Increased from 44/56 up to 46/56 12/28/2018.    Time  4    Period  Weeks    Status  Revised      PT LONG TERM GOAL #4   Title  The patient will perform sit<>right sidelying without any reports of dizziness (notes mild subjective reports at eval).    Baseline  *Notes a mild sense of "lightheaded" when coming up from the right side (1.5/10)    Time  4    Period  Weeks    Status  Partially Met            Plan - 01/03/19 2035    Clinical Impression Statement  Patient met LTG for gait speed and has significant decrease in dizziness.  PT to continue working on high level balance for improved confidence with gait.  *Plan to determine if we can d/c or need to add more visits next week.    PT Treatment/Interventions  ADLs/Self Care Home Management;Canalith Repostioning;Therapeutic activities;Therapeutic exercise;Balance training;Neuromuscular re-education;Gait training;Stair training;Functional mobility training;Patient/family education;Manual techniques    PT Next Visit Plan  Check LTGs, further discuss d/c versus adding more visits, dynamic gait, community surfaces    Consulted and Agree with Plan of Care  Patient       Patient will benefit from skilled therapeutic intervention in order to improve the following deficits and impairments:  Abnormal gait, Dizziness, Decreased balance, Decreased mobility, Impaired flexibility  Visit Diagnosis: Unsteadiness on  feet  Other abnormalities of gait and mobility  Dizziness and giddiness  Muscle weakness (generalized)     Problem List Patient Active Problem List   Diagnosis Date Noted  . 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 CKD stage 2 due to type 2 diabetes mellitus ([place]) 12/20/2017  . Chronic anticoagulation 09/04/2017  . Labile  blood glucose   . Hypoalbuminemia due to protein-calorie malnutrition ([place])   . Ataxia, post-stroke   . History of CVA with residual deficit   . Hypothyroidism 09/13/2016  . Medicare annual wellness visit, subsequent 08/28/2015  . 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 Vitamin D deficiency 04/29/2014  . Medication management 01/03/2014  . Type 2 diabetes mellitus ([place])   . BPH (benign prostatic hyperplasia)   . 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 Anemia 11/07/2013  . Essential hypertension 11/07/2013  . Hyperlipidemia 11/07/2013  . Paroxysmal atrial fibrillation ([place]) 09/18/2012    Earl [name] , PT 01/03/2019, 8:36 PM  [facility] [address] [place], [place], [address] Phone: [phone]   Fax:  [phone]  Name: Earl [name] MRN: [id number] Date of Birth: May 20, 1941

## 2019-01-07 ENCOUNTER — Ambulatory Visit: Payer: Medicare Other | Admitting: Rehabilitative and Restorative Service Providers"

## 2019-01-07 ENCOUNTER — Encounter: Payer: Self-pay | Admitting: Rehabilitative and Restorative Service Providers"

## 2019-01-07 DIAGNOSIS — R42 Dizziness and giddiness: Secondary | ICD-10-CM | POA: Diagnosis not present

## 2019-01-07 DIAGNOSIS — M6281 Muscle weakness (generalized): Secondary | ICD-10-CM

## 2019-01-07 DIAGNOSIS — R2681 Unsteadiness on feet: Secondary | ICD-10-CM | POA: Diagnosis not present

## 2019-01-07 DIAGNOSIS — R2689 Other abnormalities of gait and mobility: Secondary | ICD-10-CM

## 2019-01-07 NOTE — Therapy (Signed)
[facility] [address] [place], [place], [address] Phone: [phone]   Fax:  [phone]  Physical Therapy Treatment and Discharge Summary  Patient Details  Name: Earl [name] MRN: [id number] Date of Birth: [birth year] Referring Provider (PT): Venancio Poisson, NP   Encounter Date: 01/07/2019  PT End of Session - 01/07/19 0941    Visit Number  8    Number of Visits  9    Date for PT Re-Evaluation  01/30/19    Authorization Type  [employer] medicare- 10th visit progress note    PT Start Time  0935    PT Stop Time  1015    PT Time Calculation (min)  40 min    Equipment Utilized During Treatment  Gait belt    Activity Tolerance  Patient tolerated treatment well    Behavior During Therapy  [facility] for tasks assessed/performed       Past Medical History:  Diagnosis Date  . Adenomatous colon polyp   . Anal fissure   . BPH (benign prostatic hyperplasia)   . Diabetes mellitus ([place])   . Hyperlipidemia   . Hypertension   . Hypothyroidism   . Other testicular hypofunction   . Stroke ([place])   . Type II or unspecified type diabetes mellitus without mention of complication, not stated as uncontrolled     Past Surgical History:  Procedure Laterality Date  . INGUINAL HERNIA REPAIR Bilateral    1968 and 2004  . LOOP RECORDER INSERTION N/A 07/28/2017   Procedure: LOOP RECORDER INSERTION;  Surgeon: Deboraha Sprang, MD;  Location: [facility];  Service: Cardiovascular;  Laterality: N/A;  . TEE WITHOUT CARDIOVERSION N/A 07/28/2017   Procedure: TRANSESOPHAGEAL ECHOCARDIOGRAM (TEE);  Surgeon: Sueanne Margarita, MD;  Location: [facility] ENDOSCOPY;  Service: Cardiovascular;  Laterality: N/A;    There were no vitals filed for this visit.  Subjective Assessment - 01/07/19 0939    Subjective  The patient notes he is working on his exercises and walking faster.  He notes he has very limited episodes of lightheaded sensation that comes and  goes quickly (rates it a 1.5/10).    Pertinent History  Stroke 15 months ago, diabetes, a-fib.    Patient Stated Goals  "I don't have this whooziness."    Currently in Pain?  No/denies         [facility] PT Assessment - 01/07/19 0958      Standardized Balance Assessment   Standardized Balance Assessment  Berg Balance Test      Berg Balance Test   Sit to Stand  Able to stand without using hands and stabilize independently    Standing Unsupported  Able to stand safely 2 minutes    Sitting with Back Unsupported but Feet Supported on Floor or Stool  Able to sit safely and securely 2 minutes    Stand to Sit  Sits safely with minimal use of hands    Transfers  Able to transfer safely, minor use of hands    Standing Unsupported with Eyes Closed  Able to stand 10 seconds safely    Standing Ubsupported with Feet Together  Able to place feet together independently and stand 1 minute safely    From Standing, Reach Forward with Outstretched Arm  Can reach confidently >25 cm (10")    From Standing Position, Pick up Object from Floor  Able to pick up shoe safely and easily    From Standing Position, Turn to Look Behind Over each Shoulder  Looks behind from both sides and weight shifts well    Turn 360 Degrees  Able to turn 360 degrees safely but slowly    Standing Unsupported, Alternately Place Feet on Step/Stool  Able to stand independently and safely and complete 8 steps in 20 seconds    Standing Unsupported, One Foot in Front  Able to plae foot ahead of the other independently and hold 30 seconds    Standing on One Leg  Tries to lift leg/unable to hold 3 seconds but remains standing independently    Total Score  50    Berg comment:  50/56                   [facility] Adult PT Treatment/Exercise - 01/07/19 9892      Ambulation/Gait   Ambulation/Gait  Yes    Ambulation/Gait Assistance  6: Modified independent (Device/Increase time)    Ambulation Distance (Feet)  700 Feet    Assistive  device  None;Straight cane    Gait Pattern  Decreased stance time - right;Decreased stride length;Decreased dorsiflexion - right;Step-through pattern;Decreased step length - right    Ambulation Surface  Level;Unlevel;Indoor;Outdoor;Paved;Grass    Gait Comments  Walked indoors x 100 ft without cane mod indep (slower pace) and outdoors with SPC on grass, curbs, and inclines x 1000 ft without loss of balance.      Neuro Re-ed    Neuro Re-ed Details   Reviewed all HEP:  included corner standing with head rotation, head nods, weight shifting with feet in stride position  Added single leg stance to HEP.      Exercises   Exercises  Other Exercises    Other Exercises   hamstring stretch, heel cord stretch.      Vestibular Treatment/Exercise - 01/07/19 0959      Vestibular Treatment/Exercise   Vestibular Treatment Provided  Gaze      X1 Viewing Horizontal   Foot Position  feet apart near support surface    Comments  30 seconds with cues on speed.            PT Education - 01/07/19 1644    Education Details  added single leg stance to HEP    Person(s) Educated  Patient    Methods  Explanation;Demonstration;Handout    Comprehension  Verbalized understanding;Returned demonstration          PT Long Term Goals - 01/07/19 0945      PT LONG TERM GOAL #1   Title  The patient will be able to return demo progression of HEP for post d/c.     Time  4    Period  Weeks    Status  Achieved      PT LONG TERM GOAL #2   Title  The patient will improve gait speed from 2.31 ft/sec to > or equal to 2.62 ft/sec to demo improving functional mobility for community ambulation.    Baseline  3.15 ft/sec with SPC mod indep.    Time  4    Period  Weeks    Status  Achieved      PT LONG TERM GOAL #3   Title  The patient will improve Berg score from 44/56 to > or equal to 49/56 to demo reduced fall risk.    Baseline  50/56    Time  4    Period  Weeks    Status  Achieved      PT LONG TERM GOAL #4    Title  The patient will perform sit<>right sidelying without any reports of dizziness (notes mild subjective reports at eval).    Baseline  *Notes a mild sense of "lightheaded" when coming up from the right side (1.5/10)    Time  4    Period  Weeks    Status  Partially Met            Plan - 01/07/19 1646    Clinical Impression Statement  The patient met 3 LTGs and feels very low level "lightheadedness" that clears quickly at times during transitional movements.  The patient is motivated to continue working on HEP and plans to check into local gym once his wife finishes her medical treatment (for cancer).  Patient feels that he is able to continue with current HEP.     PT Treatment/Interventions  ADLs/Self Care Home Management;Canalith Repostioning;Therapeutic activities;Therapeutic exercise;Balance training;Neuromuscular re-education;Gait training;Stair training;Functional mobility training;Patient/family education;Manual techniques    PT Next Visit Plan  discharge today.    Consulted and Agree with Plan of Care  Patient       Patient will benefit from skilled therapeutic intervention in order to improve the following deficits and impairments:  Abnormal gait, Dizziness, Decreased balance, Decreased mobility, Impaired flexibility  Visit Diagnosis: Unsteadiness on feet  Other abnormalities of gait and mobility  Dizziness and giddiness  Muscle weakness (generalized)    PHYSICAL THERAPY DISCHARGE SUMMARY  Visits from Start of Care: 8  Current functional level related to goals / functional outcomes: See above   Remaining deficits: R hamstring tightness, decreased right heel strike with gait due to impaired R motor function s/p CVA Occasional/intermittent lightheaded sensation   Education / Equipment: Home program progression.  Plan: Patient agrees to discharge.  Patient goals were met. Patient is being discharged due to meeting the stated rehab goals.  ?????          Thank you for the referral of this patient. Rudell Cobb, MPT   [place] 01/07/2019, 4:48 PM  [facility] [address] [place], [place], [address] Phone: [phone]   Fax:  [phone]  Name: Christ Fullenwider MRN: [id number] Date of Birth: 1941-07-09

## 2019-01-07 NOTE — Patient Instructions (Signed)
Access Code: [email or web address]  URL: [email or web address]  Date: 01/07/2019  Prepared by: Rudell Cobb   Exercises Seated Hamstring Stretch with Chair - 2 reps - 1 sets - 30 seconds hold - 1x daily - 7x weekly Standing Gastroc Stretch - 2 reps - 1 sets - 30 seconds hold - 1x daily - 7x weekly Stride Stance Weight Shift - 10 reps - 2 sets - 1x daily - 7x weekly Standing Gaze Stabilization with Head Rotation - 2 reps - 1 sets - 3x daily - 7x weekly Standing with Head Nod - 10 reps - 1 sets - 1x daily - 7x weekly Standing with Head Rotation - 10 reps - 1 sets - 1x daily - 7x weekly Single Leg Stance - 3 reps - 1 sets - 10 seconds hold - 1-2x daily - 7x weekly

## 2019-01-10 ENCOUNTER — Ambulatory Visit: Payer: Medicare Other | Admitting: Rehabilitative and Restorative Service Providers"

## 2019-01-22 ENCOUNTER — Ambulatory Visit (INDEPENDENT_AMBULATORY_CARE_PROVIDER_SITE_OTHER): Payer: Medicare Other | Admitting: Internal Medicine

## 2019-01-22 ENCOUNTER — Encounter: Payer: Self-pay | Admitting: Internal Medicine

## 2019-01-22 VITALS — BP 164/80 | HR 84 | Temp 97.0°F | Resp 16 | Ht 71.0 in | Wt 187.2 lb

## 2019-01-22 DIAGNOSIS — B3742 Candidal balanitis: Secondary | ICD-10-CM | POA: Diagnosis not present

## 2019-01-22 MED ORDER — FLUCONAZOLE 100 MG PO TABS: ORAL_TABLET | ORAL | 1 refills | Status: DC

## 2019-01-22 NOTE — Progress Notes (Signed)
Subjective:    Patient ID: Earl [name], male    DOB: 1941/10/13, 78 y.o.   MRN: [id number]  HPI  Patient is a nice 78 yo MBM treated about 2 month ago for a candidal infection of the glans and prepuce and reports the infection never completely cleared up on Diflucan 150 mg weekly.   Outpatient Medications Prior to Visit  Medication Sig Dispense Refill  . ALPRAZolam (XANAX) 0.5 MG tablet Take 1/2 to 1 tablet 2 to 2 x /day as needed for Anxiety 90 tablet 0  . atorvastatin (LIPITOR) 40 MG tablet TAKE 1 TABLET BY MOUTH  DAILY AT 6 PM. 90 tablet 1  . Cholecalciferol (VITAMIN D PO) Take 5,000 Units by mouth daily.    . Cyanocobalamin (B-12) 50 MCG TABS Take 50 mcg by mouth daily.    [name] ELIQUIS 5 MG TABS tablet TAKE 1 TABLET BY MOUTH TWO  TIMES DAILY 180 tablet 1  . finasteride (PROSCAR) 5 MG tablet TAKE 1 TABLET BY MOUTH  EVERY DAY FOR PROSTATE 90 tablet 3  . glipiZIDE (GLUCOTROL) 5 MG tablet TAKE 1 TABLET BY MOUTH 2  TIMES DAILY BEFORE MEALS 180 tablet 1  . hydrocortisone (ANUSOL-HC) 2.5 % rectal cream Apply rectally  2  to 3 x /daily 30 g 3  . levothyroxine (SYNTHROID, LEVOTHROID) 50 MCG tablet TAKE 1 TABLET BY MOUTH  DAILY BEFORE BREAKFAST 90 tablet 1  . lisinopril (PRINIVIL,ZESTRIL) 5 MG tablet TAKE 1 TABLET BY MOUTH  DAILY 90 tablet 1  . Magnesium 250 MG TABS Take 250 mg by mouth daily.    . meclizine (ANTIVERT) 25 MG tablet Take 1/2 to 1 tablet 2 to 3 x  /day for Dizziness  / Vertigo 30 tablet 0  . metFORMIN (GLUCOPHAGE-XR) 500 MG 24 hr tablet TAKE 1 TABLET BY MOUTH 4  TIMES DAILY FOR DIABETES 360 tablet 3  . Multiple Vitamin (MULTIVITAMIN) capsule Take 1 capsule by mouth daily.      . vitamin C (ASCORBIC ACID) 500 MG tablet Take 500 mg by mouth daily.      . fluconazole (DIFLUCAN) 150 MG tablet Take 1 tablet weekly if needed for yeast infection 4 tablet 3   No facility-administered medications prior to visit.    Allergies  Allergen Reactions  . Penicillins Other (See Comments)    Past Medical History:  Diagnosis Date  . Adenomatous colon polyp   . Anal fissure   . BPH (benign prostatic hyperplasia)   . Diabetes mellitus ([place])   . Hyperlipidemia   . Hypertension   . Hypothyroidism   . Other testicular hypofunction   . Stroke ([place])   . Type II or unspecified type diabetes mellitus without mention of complication, not stated as uncontrolled    Review of Systems    10 point systems review negative except as above.    Objective:   Physical Exam  BP (!) 164/80   Pulse 84   Temp (!) 97 F (36.1 C)   Resp 16   Ht 5\' 11"  (1.803 m)   Wt 187 lb 3.2 oz (84.9 kg)   BMI 26.11 kg/m   HEENT - WNL. Neck - supple.  Chest - Clear equal BS. Cor - Nl HS. RRR w/o sig MGR. PP 1(+). No edema. GU - Chafed irritated prepuce & glans penis & also an area of superficial ulceration along the lateral aspect of the foreskin.  Neuro -  Nl w/o focal abnormalities.    Assessment & Plan:  1. Candidiasis of penis  - fluconazole (DIFLUCAN) 100 MG tablet; Take 1 tablet daily for yeast infection  Dispense: 21 tablet; Refill: 1  - Patient advised if sx's not resolved in 2 weeks to call for referral to urologist in consideration of a circumcision.

## 2019-01-28 ENCOUNTER — Ambulatory Visit (INDEPENDENT_AMBULATORY_CARE_PROVIDER_SITE_OTHER): Payer: Medicare Other | Admitting: *Deleted

## 2019-01-28 DIAGNOSIS — I639 Cerebral infarction, unspecified: Secondary | ICD-10-CM

## 2019-01-29 LAB — CUP PACEART REMOTE DEVICE CHECK
Date Time Interrogation Session: 20200307090524
Implantable Pulse Generator Implant Date: 20180907

## 2019-02-04 ENCOUNTER — Other Ambulatory Visit: Payer: Self-pay | Admitting: Adult Health

## 2019-02-05 NOTE — Progress Notes (Signed)
[facility] Summary Report / Loop Recorder 

## 2019-02-11 ENCOUNTER — Ambulatory Visit: Payer: Medicare Other | Admitting: Adult Health

## 2019-02-11 ENCOUNTER — Other Ambulatory Visit: Payer: Self-pay | Admitting: Internal Medicine

## 2019-02-11 DIAGNOSIS — N471 Phimosis: Secondary | ICD-10-CM

## 2019-02-11 DIAGNOSIS — B3742 Candidal balanitis: Secondary | ICD-10-CM

## 2019-02-25 ENCOUNTER — Encounter: Payer: Self-pay | Admitting: Adult Health

## 2019-02-25 ENCOUNTER — Other Ambulatory Visit: Payer: Self-pay

## 2019-02-25 ENCOUNTER — Ambulatory Visit (INDEPENDENT_AMBULATORY_CARE_PROVIDER_SITE_OTHER): Payer: Medicare Other | Admitting: Adult Health

## 2019-02-25 DIAGNOSIS — I63441 Cerebral infarction due to embolism of right cerebellar artery: Secondary | ICD-10-CM

## 2019-02-25 DIAGNOSIS — E785 Hyperlipidemia, unspecified: Secondary | ICD-10-CM

## 2019-02-25 DIAGNOSIS — I10 Essential (primary) hypertension: Secondary | ICD-10-CM | POA: Diagnosis not present

## 2019-02-25 DIAGNOSIS — E1159 Type 2 diabetes mellitus with other circulatory complications: Secondary | ICD-10-CM

## 2019-02-25 DIAGNOSIS — I480 Paroxysmal atrial fibrillation: Secondary | ICD-10-CM | POA: Diagnosis not present

## 2019-02-25 DIAGNOSIS — R42 Dizziness and giddiness: Secondary | ICD-10-CM

## 2019-02-25 NOTE — Progress Notes (Signed)
[facility] [address] [place]. [place] [address] [phone]     Virtual Visit via Telephone Note  I connected with [name] on 02/25/19 at  2:45 PM EDT by telephone  located remotely in my own home and verified that I am speaking with the correct person using two identifiers who reports being located in his own home.    I discussed the limitations, risks, security and privacy concerns of performing an evaluation and management service by telephone and the availability of in person appointments. I also discussed with the patient that there may be a patient responsible charge related to this service. The patient expressed understanding and agreed to proceed.   History of Present Illness:  Earl [name] is a 78 y.o. male  who was initially scheduled for face-to-face office visit today at this time for stroke follow-up but due to [place], face-to-face office visit rescheduled for non-face-to-face telephone visit.  Mr. Stein has underlying medical history of A. 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  Loop recorder has not shown atrial fibrillation and therefore initiated Eliquis.  He was stable from a stroke standpoint with residual mild deficits of dizziness and balance difficulties.  Prior office visit on 11/26/2018 per patient request due to concerns of increased dizziness, vertigo, head fullness, irregular heart rate and anxiety and patient was concerned for possible TIAs.  PCP attempted to use meclizine and Xanax but denies benefit of increased dizziness sensation.  He did endorse increased stress during that time.  CT head obtained to ensure no new stroke which was unremarkable.  He did have follow-up visit since this time with cardiology Dr. Caryl Comes with diagnosis of orthostatic hypertension which was likely contributing to his  dizziness symptoms.  His endorses overall improvement of dizziness and balance since prior visit as he did participate in outpatient physical therapy. He does continue to use cane for assistance and denies any recent falls. He has not been monitoring blood pressure at home as he does follow closely with PCP. BP has been stable and follows with PCP for ongoing management.  He continues on atorvastatin 40 mg without side effects myalgias. No further concerns at this time. Denies new or worsening stroke/TIA symptoms.     Observations/Objective:  General: Pleasant elderly African-American male asking and answering questions appropriately throughout conversation  *Limited exam due to visit type*  CT HEAD WO CONTRAST 12/05/2018 IMPRESSION:  CT head (without) demonstrating: - Chronic right superior cerebellar ischemic infarction. - No acute findings.  Personally reviewed all recent lab work and imaging since prior visit    Assessment and Plan:  [name] is a 78 year old male with right cerebellar infarct on 03/22/7781 likely embolic with undetermined source.  Loop recorder placed at that time which has shown evidence of atrial fibrillation and therefore was started on Eliquis.  Vascular risk factors include atrial fibrillation, DM, HLD and HTN.    He return to office in 11/2018 with reported worsening dizziness and balance difficulties but this has since resolved with great improvement after participating in physical therapy.  He continues to do well from a stroke standpoint without new or worsening stroke/TIA symptoms.  1. Right cerebellar infarct: Continue Eliquis (apixaban) daily  and atorvastatin for secondary stroke prevention. Maintain strict control of hypertension with blood pressure goal below 130/90, diabetes with hemoglobin A1c goal below 6.5% and cholesterol with LDL cholesterol (bad cholesterol) goal below  70 mg/dL.  I also advised the patient to eat a healthy diet with plenty of  whole grains, cereals, fruits and vegetables, exercise regularly with at least 30 minutes of continuous activity daily and maintain ideal body weight. 2. Atrial fibrillation: Continue Eliquis along with ongoing follow-up visits with cardiology for monitoring and management 3. HTN: Advised to continue current treatment regimen.  Advised to continue to monitor at home along with continued follow-up with PCP for management 4. HLD: Advised to continue current treatment regimen along with continued follow-up with PCP for future prescribing and monitoring of lipid panel 5. DMII: Advised to continue to monitor glucose levels at home along with continued follow-up with PCP for management and monitoring    Follow Up Instructions:   Has been stable from a stroke standpoint and advised to follow-up as needed regarding his stroke or call office with any questions or concerns    I discussed the assessment and treatment plan with the patient.  Review of CT scan obtained in 11/2018.  The patient was provided an opportunity to ask questions and all were answered to their satisfaction. The patient agreed with the plan and verbalized an understanding of the instructions.   I provided 22 minutes of non-face-to-face time during this encounter.    Venancio Poisson, AGNP-BC  [facility] [address] [place], [place] [address]  Phone [phone] Fax [phone] Note: This document was prepared with digital dictation and possible smart phrase technology. Any transcriptional errors that result from this process are unintentional.

## 2019-02-28 ENCOUNTER — Ambulatory Visit (INDEPENDENT_AMBULATORY_CARE_PROVIDER_SITE_OTHER): Payer: Medicare Other | Admitting: *Deleted

## 2019-02-28 ENCOUNTER — Other Ambulatory Visit: Payer: Self-pay

## 2019-02-28 DIAGNOSIS — I639 Cerebral infarction, unspecified: Secondary | ICD-10-CM | POA: Diagnosis not present

## 2019-02-28 LAB — CUP PACEART REMOTE DEVICE CHECK
Date Time Interrogation Session: 20200409124037
Implantable Pulse Generator Implant Date: 20180907

## 2019-03-03 NOTE — Progress Notes (Signed)
I agree with the above plan 

## 2019-03-05 ENCOUNTER — Ambulatory Visit (INDEPENDENT_AMBULATORY_CARE_PROVIDER_SITE_OTHER): Payer: Medicare Other | Admitting: Adult Health Nurse Practitioner

## 2019-03-05 ENCOUNTER — Ambulatory Visit: Payer: Self-pay | Admitting: Physician Assistant

## 2019-03-05 ENCOUNTER — Other Ambulatory Visit: Payer: Self-pay

## 2019-03-05 ENCOUNTER — Encounter: Payer: Self-pay | Admitting: Adult Health Nurse Practitioner

## 2019-03-05 VITALS — BP 142/76 | HR 105 | Temp 98.3°F | Ht 71.0 in | Wt 187.0 lb

## 2019-03-05 DIAGNOSIS — R6889 Other general symptoms and signs: Secondary | ICD-10-CM | POA: Diagnosis not present

## 2019-03-05 DIAGNOSIS — Z0001 Encounter for general adult medical examination with abnormal findings: Secondary | ICD-10-CM | POA: Diagnosis not present

## 2019-03-05 DIAGNOSIS — Z7901 Long term (current) use of anticoagulants: Secondary | ICD-10-CM

## 2019-03-05 DIAGNOSIS — D472 Monoclonal gammopathy: Secondary | ICD-10-CM

## 2019-03-05 DIAGNOSIS — E039 Hypothyroidism, unspecified: Secondary | ICD-10-CM

## 2019-03-05 DIAGNOSIS — E559 Vitamin D deficiency, unspecified: Secondary | ICD-10-CM | POA: Diagnosis not present

## 2019-03-05 DIAGNOSIS — R350 Frequency of micturition: Secondary | ICD-10-CM

## 2019-03-05 DIAGNOSIS — N401 Enlarged prostate with lower urinary tract symptoms: Secondary | ICD-10-CM

## 2019-03-05 DIAGNOSIS — E785 Hyperlipidemia, unspecified: Secondary | ICD-10-CM | POA: Diagnosis not present

## 2019-03-05 DIAGNOSIS — B3742 Candidal balanitis: Secondary | ICD-10-CM

## 2019-03-05 DIAGNOSIS — Z0000 Encounter for general adult medical examination without abnormal findings: Secondary | ICD-10-CM

## 2019-03-05 DIAGNOSIS — E1122 Type 2 diabetes mellitus with diabetic chronic kidney disease: Secondary | ICD-10-CM

## 2019-03-05 DIAGNOSIS — G6289 Other specified polyneuropathies: Secondary | ICD-10-CM

## 2019-03-05 DIAGNOSIS — I10 Essential (primary) hypertension: Secondary | ICD-10-CM | POA: Diagnosis not present

## 2019-03-05 DIAGNOSIS — D649 Anemia, unspecified: Secondary | ICD-10-CM

## 2019-03-05 DIAGNOSIS — N182 Chronic kidney disease, stage 2 (mild): Secondary | ICD-10-CM

## 2019-03-05 DIAGNOSIS — I480 Paroxysmal atrial fibrillation: Secondary | ICD-10-CM

## 2019-03-05 DIAGNOSIS — R42 Dizziness and giddiness: Secondary | ICD-10-CM

## 2019-03-05 DIAGNOSIS — Z79899 Other long term (current) drug therapy: Secondary | ICD-10-CM

## 2019-03-05 NOTE — Progress Notes (Signed)
MEDICARE ANNUAL WELLNESS VISIT AND FOLLOW UP Assessment:      [name] was seen today for follow-up, medicare wellness and other.  Diagnoses and all orders for this visit:  Medicare annual wellness visit, subsequent Yearly  Essential hypertension -     CBC with Differential/Platelet -     COMPLETE METABOLIC PANEL WITH GFR  Paroxysmal atrial fibrillation ([place]) Follows with cardiology -taking Eliquis 5mg  daily No S&S of bleeding  CKD stage 2 due to type 2 diabetes mellitus (HCC) Increase fluids, avoid NSAIDS, monitor sugars, will monitor  Hypothyroidism, unspecified type -     TSH Taking levothyroxine 55mcg, whoel tablet 3 days a week, and half tablet 4 days a week.  Type 2 diabetes mellitus with stage 2 chronic kidney disease, without long-term current use of insulin (HCC) -     Hemoglobin A1c -     Insulin, random Continue current regiment Discussed dietary and exercise modifications  Benign prostatic hyperplasia with urinary frequency Taking proscar  Candidiasis of penis Taking fluconazole with positive results Has appointment with urology in one week Continue to monitor  Anemia, unspecified type Doing well this time Will continue to monitor  Hyperlipidemia, unspecified hyperlipidemia type -     Lipid panel  Chronic anticoagulation Follows with cardiology   MGUS (monoclonal gammopathy of unknown significance) Yearly follow up with Dr Alen Blew  Vitamin D deficiency -     VITAMIN D 25 Hydroxy (Vit-D Deficiency, Fractures) Other polyneuropathy Taking B12 Managing at this time Discussed gabapentin Vertigo Has antivert 25mg  PRN Has not used in previous three months. Continue to monitor  Medication management -     CBC with Differential/Platelet -     COMPLETE METABOLIC PANEL WITH GFR -     Magnesium -     Lipid panel -     TSH -     Hemoglobin A1c -     Insulin, random -     VITAMIN D 25 Hydroxy (Vit-D Deficiency, Fractures)    Over 30  minutes of exam, counseling, chart review, and critical decision making was performed  Future Appointments  Date Time Provider [place]  03/29/2019 11:40 AM Jamse Arn, MD [facility] CPR  06/06/2019 10:00 AM [name], MD [facility] None  12/13/2019  8:00 AM [facility] LAB 6 [facility] None  12/20/2019  1:30 PM Wyatt Portela, MD [facility] None  03/10/2020  2:00 PM Garnet Sierras, NP [facility] None     Plan:   During the course of the visit the patient was educated and counseled about appropriate screening and preventive services including:    Pneumococcal vaccine   Influenza vaccine  Prevnar 13  Td vaccine  Screening electrocardiogram  Colorectal cancer screening  Diabetes screening  Glaucoma screening  Nutrition counseling    Subjective:  Earl [name] is a 78 y.o. male who presents for Medicare Annual Wellness Visit and 3 month follow up for HTN, hyperlipidemia DMII, CKD, atrial fibrillation, BPH, and vitamin D Def.    Report he is doing well since last visit.   He does not check his blood pressure at home.  Reports his fasting glucose has been between 100-120 with highest reading 140.  He is taking Metformin 4 times a days.  Denies any side effects from this. Reports he wakes about 3-4 times a night to use the restroom.  This has not changed since last visit.  Reports he still has penile candidiasis and it has improved since taking diflucan.  Has referral with urology related  to this.  Reports he checks hands and feet every morning and evening.  He endorses mild numbness and tingling in the hands & feet.  This is intermittent and denies any increased in symptoms or severity.  He iscurrently using a topical OTC Medalyte cream for this that is working well.  Discussion about next step, possible oral gabapentin.    His blood pressure has been controlled at home, today their BP is BP: (!) 142/76 He does not workout. He denies chest pain,  shortness of breath, dizziness.  He is on cholesterol medication and denies myalgias. His cholesterol is at goal. The cholesterol last visit was:   Lab Results  Component Value Date   CHOL 116 03/05/2019   HDL 55 03/05/2019   LDLCALC 47 03/05/2019   TRIG 65 03/05/2019   CHOLHDL 2.1 03/05/2019   He has not been working on diet and exercise for prediabetes, and denies hyperglycemia, hypoglycemia , nausea, paresthesia of the feet, polydipsia and polyuria. Last A1C in the office was:  Lab Results  Component Value Date   HGBA1C 7.0 (H) 03/05/2019   Last GFR Lab Results  Component Value Date   GFRNONAA 70 03/05/2019     Lab Results  Component Value Date   GFRAA 81 03/05/2019   Patient is on Vitamin D supplement.   Lab Results  Component Value Date   VD25OH 67 03/05/2019      Medication Review:  Current Outpatient Medications (Endocrine & Metabolic):  .  glipiZIDE (GLUCOTROL) 5 MG tablet, TAKE 1 TABLET BY MOUTH 2  TIMES DAILY BEFORE MEALS .  levothyroxine (SYNTHROID, LEVOTHROID) 50 MCG tablet, TAKE 1 TABLET BY MOUTH  DAILY BEFORE BREAKFAST .  metFORMIN (GLUCOPHAGE-XR) 500 MG 24 hr tablet, TAKE 1 TABLET BY MOUTH 4  TIMES DAILY FOR DIABETES  Current Outpatient Medications (Cardiovascular):  .  atorvastatin (LIPITOR) 40 MG tablet, TAKE 1 TABLET BY MOUTH  DAILY AT 6 PM. .  lisinopril (PRINIVIL,ZESTRIL) 5 MG tablet, TAKE 1 TABLET BY MOUTH  DAILY    Current Outpatient Medications (Hematological):  [name]  Cyanocobalamin (B-12) 50 MCG TABS, Take 50 mcg by mouth daily. [name]  ELIQUIS 5 MG TABS tablet, TAKE 1 TABLET BY MOUTH TWO  TIMES DAILY  Current Outpatient Medications (Other):  [name]  ALPRAZolam (XANAX) 0.5 MG tablet, Take 1/2 to 1 tablet 2 to 2 x /day as needed for Anxiety .  Cholecalciferol (VITAMIN D PO), Take 5,000 Units by mouth daily. .  finasteride (PROSCAR) 5 MG tablet, TAKE 1 TABLET BY MOUTH  EVERY DAY FOR PROSTATE .  fluconazole (DIFLUCAN) 100 MG tablet, Take 1 tablet daily for  yeast infection .  Magnesium 250 MG TABS, Take 250 mg by mouth daily. .  meclizine (ANTIVERT) 25 MG tablet, Take 1/2 to 1 tablet 2 to 3 x  /day for Dizziness  / Vertigo .  Multiple Vitamin (MULTIVITAMIN) capsule, Take 1 capsule by mouth daily.   .  vitamin C (ASCORBIC ACID) 500 MG tablet, Take 500 mg by mouth daily.   .  hydrocortisone (ANUSOL-HC) 2.5 % rectal cream, Apply rectally  2  to 3 x /daily (Patient not taking: Reported on 03/05/2019)  Allergies: Allergies  Allergen Reactions  . Penicillins Other (See Comments)    Current Problems (verified) has Paroxysmal atrial fibrillation ([place]); MGUS (monoclonal gammopathy of unknown significance); Anemia; Essential hypertension; Hyperlipidemia; Type 2 diabetes mellitus (HCC); BPH (benign prostatic hyperplasia); Medication management; Vitamin D deficiency; Diabetic mononeuritis multiplex, right abdomen; Medicare annual wellness visit, subsequent;  Hypothyroidism; History of CVA with residual deficit; Hypoalbuminemia due to protein-calorie malnutrition ([place]); Ataxia, post-stroke; Labile blood glucose; Chronic anticoagulation; CKD stage 2 due to type 2 diabetes mellitus ([place]); and Goiter on their problem list.  Screening Tests Immunization History  Administered Date(s) Administered  . DT 02/09/2015  . Influenza Split 08/07/2013  . Influenza, High Dose Seasonal PF 08/19/2014, 08/28/2015, 09/13/2016, 08/15/2017, 08/15/2018  . Pneumococcal Conjugate-13 06/10/2016  . Pneumococcal Polysaccharide-23 08/13/2008  . Td 11/22/2003    Preventative care: Last colonoscopy: 2013, Q5years, DUE to schedule one COVID-19 restriction lifted.  Prior vaccinations: TD or Tdap: 2016  Influenza: 2019 Pneumococcal: 2009 Prevnar13: 2017 Shingles/Zostavax: N/A  Names of Other Physician/Practitioners you currently use: 1. [facility] here for primary care 2. Diabetic Eye Exam: Scheduled Oct 2020  3. Dentist:  Due for  2020  Patient Care Team: [name], MD as PCP - General (Internal Medicine) Inda Castle, MD (Inactive) as Consulting Physician (Gastroenterology) Wyatt Portela, MD as Consulting Physician (Oncology) Sharyne Peach, MD as Consulting Physician (Ophthalmology) Urology, 4/21st Dr Agustina Caroli*  Surgical: He  has a past surgical history that includes Inguinal hernia repair (Bilateral); TEE without cardioversion (N/A, 07/28/2017); and LOOP RECORDER INSERTION (N/A, 07/28/2017). Family His family history includes Diabetes in his father; Sickle cell anemia in his mother; Stomach cancer in his father. Social history  He reports that he has never smoked. He has never used smokeless tobacco. He reports that he does not drink alcohol or use drugs.  MEDICARE WELLNESS OBJECTIVES: Physical activity:   Cardiac risk factors:   Depression/mood screen:   Depression screen [facility] 2/9 03/05/2019  Decreased Interest 0  Down, Depressed, Hopeless 0  PHQ - 2 Score 0  Altered sleeping -  Tired, decreased energy -  Change in appetite -  Feeling bad or failure about yourself  -  Trouble concentrating -  Moving slowly or fidgety/restless -  Suicidal thoughts -  PHQ-9 Score -  Difficult doing work/chores -    ADLs:  In your present state of health, do you have any difficulty performing the following activities: 03/05/2019 01/22/2019  Hearing? N N  Vision? N N  Difficulty concentrating or making decisions? - N  Walking or climbing stairs? - Y  Comment - using a cane  Dressing or bathing? - N  Doing errands, shopping? - N  Some recent data might be hidden     Cognitive Testing  Alert? Yes  Normal Appearance?Yes  Oriented to person? Yes  Place? Yes   Time? Yes  Recall of three objects?  Yes  Can perform simple calculations? Yes  Displays appropriate judgment?Yes  Can read the correct time from a watch face?Yes  EOL planning: Does Patient Have a Medical Advance Directive?: Yes Type of Advance  Directive: Healthcare Power of Attorney, Living will Does patient want to make changes to medical advance directive?: Yes (Inpatient - patient defers changing a medical advance directive and declines information at this time) Copy of [place] in Chart?: No - copy requested   Objective:   Today's Vitals   03/05/19 1347  BP: (!) 142/76  Pulse: (!) 105  Temp: 98.3 F (36.8 C)  SpO2: 98%  Weight: 187 lb (84.8 kg)  Height: 5\' 11"  (1.803 m)   Body mass index is 26.08 kg/m.  General appearance: alert, no distress, WD/WN, male HEENT: normocephalic, sclerae anicteric, TMs pearly, nares patent, no discharge or erythema, pharynx normal Oral cavity: MMM, no lesions Neck:  supple, no lymphadenopathy, no thyromegaly, no masses Heart: RRR, normal S1, S2, no murmurs Lungs: CTA bilaterally, no wheezes, rhonchi, or rales Abdomen: +bs, soft, non tender, non distended, no masses, no hepatomegaly, no splenomegaly Musculoskeletal: nontender, no swelling, no obvious deformity Extremities: no edema, no cyanosis, no clubbing Pulses: 2+ symmetric, upper and lower extremities, normal cap refill Neurological: alert, oriented x 3, CN2-12 intact, strength normal upper extremities and lower extremities, sensation normal throughout, DTRs 2+ throughout, no cerebellar signs, gait normal Psychiatric: normal affect, behavior normal, pleasant   Medicare Attestation I have personally reviewed: The patient's medical and social history Their use of alcohol, tobacco or illicit drugs Their current medications and supplements The patient's functional ability including ADLs,fall risks, home safety risks, cognitive, and hearing and visual impairment Diet and physical activities Evidence for depression or mood disorders  The patient's weight, height, BMI, and visual acuity have been recorded in the chart.  I have made referrals, counseling, and provided education to the patient based on review of the  above and I have provided the patient with a written personalized care plan for preventive services.     Garnet Sierras, NP [facility] 03/07/2019  12:11 PM

## 2019-03-05 NOTE — Patient Instructions (Addendum)
Earl [name] , Thank you for taking time to come for your Medicare Wellness Visit. I appreciate your ongoing commitment to your health goals. Please review the following plan we discussed and let me know if I can assist you in the future.   These are the goals we discussed: Goals    . DIET - INCREASE WATER INTAKE     5-6 bottles of water daily    . Exercise 150 min/wk Moderate Activity    . HEMOGLOBIN A1C < 6.5     Watch the salt in your diet   This is a list of the screening recommended for you and due dates:  Health Maintenance  Topic Date Due  . Colon Cancer Screening  09/07/2017  . Complete foot exam   05/11/2019  . Hemoglobin A1C  05/28/2019  . Flu Shot  06/22/2019  . Eye exam for diabetics  10/05/2019  . Tetanus Vaccine  02/08/2025  . Pneumonia vaccines  Completed      Coronavirus (COVID-19) Are you at risk?  Are you at risk for the Coronavirus (COVID-19)?  To be considered HIGH RISK for Coronavirus (COVID-19), you have to meet the following criteria:  . Traveled to [place], [place], [place], [place] or [place]; or in the [place] to [place], [place], [place]  . or [place]; and have fever, cough, and shortness of breath within the last 2 weeks of travel OR . Been in close contact with a person diagnosed with COVID-19 within the last 2 weeks and have  . fever, cough,and shortness of breath .  . IF YOU DO NOT MEET THESE CRITERIA, YOU ARE CONSIDERED LOW RISK FOR COVID-19.  What to do if you are HIGH RISK for COVID-19?  [name] If you are having a medical emergency, call 911. . Seek medical care right away. Before you go to a doctor's office, urgent care or emergency department, .  call ahead and tell them about your recent travel, contact with someone diagnosed with COVID-19  .  and your symptoms.  . You should receive instructions from your physician's office regarding next steps of care.  . When you arrive at healthcare provider, tell the healthcare staff  immediately you have returned from  . visiting [place], [place], [place], [place] or [place]; or traveled in the [place] to [place], [place],  . [place] or [place] in the last two weeks or you have been in close contact with a person diagnosed with  . COVID-19 in the last 2 weeks.   . Tell the health care staff about your symptoms: fever, cough and shortness of breath. . After you have been seen by a medical provider, you will be either: o Tested for (COVID-19) and discharged home on quarantine except to seek medical care if  o symptoms worsen, and asked to  - Stay home and avoid contact with others until you get your results (4-5 days)  - Avoid travel on public transportation if possible (such as bus, train, or airplane) or o Sent to the Emergency Department by EMS for evaluation, COVID-19 testing  and  o possible admission depending on your condition and test results.  What to do if you are LOW RISK for COVID-19?  Reduce your risk of any infection by using the same precautions used for avoiding the common cold or flu:  [name] Wash your hands often with soap and warm water for at least 20 seconds.  If soap and water are not readily  available,  . use an alcohol-based hand sanitizer with at least 60% alcohol.  . If coughing or sneezing, cover your mouth and nose by coughing or sneezing into the elbow areas of your shirt or coat, .  into a tissue or into your sleeve (not your hands). . Avoid shaking hands with others and consider head nods or verbal greetings only. . Avoid touching your eyes, nose, or mouth with unwashed hands.  . Avoid close contact with people who are sick. . Avoid places or events with large numbers of people in one location, like concerts or sporting events. . Carefully consider travel plans you have or are making. . If you are planning any travel outside or inside the [place], visit the CDC's Travelers' Health webpage for the latest health notices. . If you have some  symptoms but not all symptoms, continue to monitor at home and seek medical attention  . if your symptoms worsen. . If you are having a medical emergency, call 911. >>>>>>>>>>>>>>>>>>>>>>>>>>>>>>>>>>>>>>>>>>>>>>>>>>>>>>> We Do NOT Approve of  [facility] [place] Soliciting Our Patients  To Do Home Visits  & We Do NOT Approve of [employer] > > > > > > > > > > > > > > > > > > > > > > > > > > > > > > > > > > >  > > > >   Preventive Care for Adults  A healthy lifestyle and preventive care can promote health and wellness. Preventive health guidelines for men include the following key practices:  A routine yearly physical is a good way to check with your health care provider about your health and preventative screening. It is a chance to share any concerns and updates on your health and to receive a thorough exam.  Visit your dentist for a routine exam and preventative care every 6 months. Brush your teeth twice a day and floss once a day. Good oral hygiene prevents tooth decay and gum disease.  The frequency of eye exams is based on your age, health, family medical history, use of contact lenses, and other factors. Follow your health care provider's recommendations for frequency of eye exams.  Eat a healthy diet. Foods such as vegetables, fruits, whole grains, low-fat dairy products, and lean protein foods contain the nutrients you need without too many calories. Decrease your intake of foods high in solid fats, added sugars, and salt. Eat the right amount of calories for you. Get information about a proper diet from your health care provider, if necessary.  Regular physical exercise is one of the most important things you can do for your health. Most adults should get at least 150 minutes of moderate-intensity exercise (any activity that increases your heart rate and causes you to sweat) each week. In addition, most adults need muscle-strengthening exercises on 2 or more days  a week.  Maintain a healthy weight. The body mass index (BMI) is a screening tool to identify possible weight problems. It provides an estimate of body fat based on height and weight. Your health care provider can find your BMI and can help you achieve or maintain a healthy weight. For adults 20 years and older:  A BMI below 18.5 is considered underweight.  A BMI of 18.5 to 24.9 is normal.  A BMI of 25 to 29.9 is considered overweight.  A BMI of 30 and above is considered obese.  Maintain normal blood lipids and cholesterol levels by exercising and minimizing your intake of  saturated fat. Eat a balanced diet with plenty of fruit and vegetables. Blood tests for lipids and cholesterol should begin at age 85 and be repeated every 5 years. If your lipid or cholesterol levels are high, you are over 50, or you are at high risk for heart disease, you may need your cholesterol levels checked more frequently. Ongoing high lipid and cholesterol levels should be treated with medicines if diet and exercise are not working.  If you smoke, find out from your health care provider how to quit. If you do not use tobacco, do not start.  Lung cancer screening is recommended for adults aged 66-80 years who are at high risk for developing lung cancer because of a history of smoking. A yearly low-dose CT scan of the lungs is recommended for people who have at least a 30-pack-year history of smoking and are a current smoker or have quit within the past 15 years. A pack year of smoking is smoking an average of 1 pack of cigarettes a day for 1 year (for example: 1 pack a day for 30 years or 2 packs a day for 15 years). Yearly screening should continue until the smoker has stopped smoking for at least 15 years. Yearly screening should be stopped for people who develop a health problem that would prevent them from having lung cancer treatment.  If you choose to drink alcohol, do not have more than 2 drinks per day. One drink  is considered to be 12 ounces (355 mL) of beer, 5 ounces (148 mL) of wine, or 1.5 ounces (44 mL) of liquor.  Avoid use of street drugs. Do not share needles with anyone. Ask for help if you need support or instructions about stopping the use of drugs.  High blood pressure causes heart disease and increases the risk of stroke. Your blood pressure should be checked at least every 1-2 years. Ongoing high blood pressure should be treated with medicines, if weight loss and exercise are not effective.  If you are 12-66 years old, ask your health care provider if you should take aspirin to prevent heart disease.  Diabetes screening involves taking a blood sample to check your fasting blood sugar level. Testing should be considered at a younger age or be carried out more frequently if you are overweight and have at least 1 risk factor for diabetes.  Colorectal cancer can be detected and often prevented. Most routine colorectal cancer screening begins at the age of 61 and continues through age 38. However, your health care provider may recommend screening at an earlier age if you have risk factors for colon cancer. On a yearly basis, your health care provider may provide home test kits to check for hidden blood in the stool. Use of a small camera at the end of a tube to directly examine the colon (sigmoidoscopy or colonoscopy) can detect the earliest forms of colorectal cancer. Talk to your health care provider about this at age 68, when routine screening begins. Direct exam of the colon should be repeated every 5-10 years through age 37, unless early forms of precancerous polyps or small growths are found.  Hepatitis C blood testing is recommended for all people born from [birth year] through 1965 and any individual with known risks for hepatitis C.  Screening for abdominal aortic aneurysm (AAA)  by ultrasound is recommended for people who have history of high blood pressure or who are current or former  smokers.  Healthy men should  receive prostate-specific antigen (PSA)  blood tests as part of routine cancer screening. Talk with your health care provider about prostate cancer screening.  Testicular cancer screening is  recommended for adult males. Screening includes self-exam, a health care provider exam, and other screening tests. Consult with your health care provider about any symptoms you have or any concerns you have about testicular cancer.  Use sunscreen. Apply sunscreen liberally and repeatedly throughout the day. You should seek shade when your shadow is shorter than you. Protect yourself by wearing long sleeves, pants, a wide-brimmed hat, and sunglasses year round, whenever you are outdoors.  Once a month, do a whole-body skin exam, using a mirror to look at the skin on your back. Tell your health care provider about new moles, moles that have irregular borders, moles that are larger than a pencil eraser, or moles that have changed in shape or color.  Stay current with required vaccines (immunizations).  Influenza vaccine. All adults should be immunized every year.  Tetanus, diphtheria, and acellular pertussis (Td, Tdap) vaccine. An adult who has not previously received Tdap or who does not know his vaccine status should receive 1 dose of Tdap. This initial dose should be followed by tetanus and diphtheria toxoids (Td) booster doses every 10 years. Adults with an unknown or incomplete history of completing a 3-dose immunization series with Td-containing vaccines should begin or complete a primary immunization series including a Tdap dose. Adults should receive a Td booster every 10 years.  Zoster vaccine. One dose is recommended for adults aged 56 years or older unless certain conditions are present.    PREVNAR - Pneumococcal 13-valent conjugate (PCV13) vaccine. When indicated, a person who is uncertain of his immunization history and has no record of immunization should receive the  PCV13 vaccine. An adult aged 71 years or older who has certain medical conditions and has not been previously immunized should receive 1 dose of PCV13 vaccine. This PCV13 should be followed with a dose of pneumococcal polysaccharide (PPSV23) vaccine. The PPSV23 vaccine dose should be obtained 1 or more year(s)after the dose of PCV13 vaccine. An adult aged 59 years or older who has certain medical conditions and previously received 1 or more doses of PPSV23 vaccine should receive 1 dose of PCV13. The PCV13 vaccine dose should be obtained 1 or more years after the last PPSV23 vaccine dose.    PNEUMOVAX - Pneumococcal polysaccharide (PPSV23) vaccine. When PCV13 is also indicated, PCV13 should be obtained first. All adults aged 64 years and older should be immunized. An adult younger than age 9 years who has certain medical conditions should be immunized. Any person who resides in a nursing home or long-term care facility should be immunized. An adult smoker should be immunized. People with an immunocompromised condition and certain other conditions should receive both PCV13 and PPSV23 vaccines. People with human immunodeficiency virus (HIV) infection should be immunized as soon as possible after diagnosis. Immunization during chemotherapy or radiation therapy should be avoided. Routine use of PPSV23 vaccine is not recommended for American Indians, [place]atives, or people younger than 65 years unless there are medical conditions that require PPSV23 vaccine. When indicated, people who have unknown immunization and have no record of immunization should receive PPSV23 vaccine. One-time revaccination 5 years after the first dose of PPSV23 is recommended for people aged 19-64 years who have chronic kidney failure, nephrotic syndrome, asplenia, or immunocompromised conditions. People who received 1-2 doses of PPSV23 before age 70 years should receive another dose of PPSV23 vaccine at  age 67 years or later if at least 5  years have passed since the previous dose. Doses of PPSV23 are not needed for people immunized with PPSV23 at or after age 36 years.    Hepatitis A vaccine. Adults who wish to be protected from this disease, have certain high-risk conditions, work with hepatitis A-infected animals, work in hepatitis A research labs, or travel to or work in countries with a high rate of hepatitis A should be immunized. Adults who were previously unvaccinated and who anticipate close contact with an international adoptee during the first 60 days after arrival in the [place] from a country with a high rate of hepatitis A should be immunized.    Hepatitis B vaccine. Adults should be immunized if they wish to be protected from this disease, have certain high-risk conditions, may be exposed to blood or other infectious body fluids, are household contacts or sex partners of hepatitis B positive people, are clients or workers in certain care facilities, or travel to or work in countries with a high rate of hepatitis B.   Preventive Service / Frequency   Ages 5 and over  Blood pressure check.  Lipid and cholesterol check.  Lung cancer screening. / Every year if you are aged 65-80 years and have a 30-pack-year history of smoking and currently smoke or have quit within the past 15 years. Yearly screening is stopped once you have quit smoking for at least 15 years or develop a health problem that would prevent you from having lung cancer treatment.  Fecal occult blood test (FOBT) of stool. You may not have to do this test if you get a colonoscopy every 10 years.  Flexible sigmoidoscopy** or colonoscopy.** / Every 5 years for a flexible sigmoidoscopy or every 10 years for a colonoscopy beginning at age 33 and continuing until age 61.  Hepatitis C blood test.** / For all people born from [birth year] through 1965 and any individual with known risks for hepatitis C.  Abdominal aortic aneurysm (AAA) screening./ Screening  current or former smokers or have Hypertension.  Skin self-exam. / Monthly.  Influenza vaccine. / Every year.  Tetanus, diphtheria, and acellular pertussis (Tdap/Td) vaccine.** / 1 dose of Td every 10 years.   Zoster vaccine.** / 1 dose for adults aged 75 years or older.         Pneumococcal 13-valent conjugate (PCV13) vaccine.    Pneumococcal polysaccharide (PPSV23) vaccine.     Hepatitis A vaccine.** / Consult your health care provider.  Hepatitis B vaccine.** / Consult your health care provider. Screening for abdominal aortic aneurysm (AAA)  by ultrasound is recommended for people who have history of high blood pressure or who are current or former smokers. ++++++++++ Recommend Adult Low Dose Aspirin or  coated  Aspirin 81 mg daily  To reduce risk of Colon Cancer 20 %,  Skin Cancer 26 % ,  Malignant Melanoma 46%  and  Pancreatic cancer 60% ++++++++++++++++++++++ Vitamin D goal  is between 70-100.  Please make sure that you are taking your Vitamin D as directed.  It is very important as a natural anti-inflammatory  helping hair, skin, and nails, as well as reducing stroke and heart attack risk.  It helps your bones and helps with mood. It also decreases numerous cancer risks so please take it as directed.  Low Vit D is associated with a 200-300% higher risk for CANCER  and 200-300% higher risk for HEART   ATTACK  &  STROKE.   .....................................[name]  It is also associated with higher death rate at younger ages,  autoimmune diseases like Rheumatoid arthritis, Lupus, Multiple Sclerosis.    Also many other serious conditions, like depression, Alzheimer's Dementia, infertility, muscle aches, fatigue, fibromyalgia - just to name a few. ++++++++++++++++++++++ Recommend the book "The END of DIETING" by Dr Excell Seltzer  & the book "The END of DIABETES " by Dr Excell Seltzer At [email or web address] - get book & Audio CD's    Being diabetic has a  300% increased risk for  heart attack, stroke, cancer, and alzheimer- type vascular dementia. It is very important that you work harder with diet by avoiding all foods that are white. Avoid white rice (brown & wild rice is OK), white potatoes (sweetpotatoes in moderation is OK), White bread or wheat bread or anything made out of white flour like bagels, donuts, rolls, buns, biscuits, cakes, pastries, cookies, pizza crust, and pasta (made from white flour & egg whites) - vegetarian pasta or spinach or wheat pasta is OK. Multigrain breads like Arnold's or Pepperidge [employer], or multigrain sandwich thins or flatbreads.  Diet, exercise and weight loss can reverse and cure diabetes in the early stages.  Diet, exercise and weight loss is very important in the control and prevention of complications of diabetes which affects every system in your body, ie. Brain - dementia/stroke, eyes - glaucoma/blindness, heart - heart attack/heart failure, kidneys - dialysis, stomach - gastric paralysis, intestines - malabsorption, nerves - severe painful neuritis, circulation - gangrene & loss of a leg(s), and finally cancer and Alzheimers.    I recommend avoid fried & greasy foods,  sweets/candy, white rice (brown or wild rice or Quinoa is OK), white potatoes (sweet potatoes are OK) - anything made from white flour - bagels, doughnuts, rolls, buns, biscuits,white and wheat breads, pizza crust and traditional pasta made of white flour & egg white(vegetarian pasta or spinach or wheat pasta is OK).  Multi-grain bread is OK - like multi-grain flat bread or sandwich thins. Avoid alcohol in excess. Exercise is also important.    Eat all the vegetables you want - avoid meat, especially red meat and dairy - especially cheese.  Cheese is the most concentrated form of trans-fats which is the worst thing to clog up our arteries. Veggie cheese is OK which can be found in the fresh produce section at [employer] or [employer] or  [employer]  ++++++++++++++++++++++ DASH Eating Plan  DASH stands for "Dietary Approaches to Stop Hypertension."   The DASH eating plan is a healthy eating plan that has been shown to reduce high blood pressure (hypertension). Additional health benefits may include reducing the risk of type 2 diabetes mellitus, heart disease, and stroke. The DASH eating plan may also help with weight loss. WHAT DO I NEED TO KNOW ABOUT THE DASH EATING PLAN? For the DASH eating plan, you will follow these general guidelines:  Choose foods with a percent daily value for sodium of less than 5% (as listed on the food label).  Use salt-free seasonings or herbs instead of table salt or sea salt.  Check with your health care provider or pharmacist before using salt substitutes.  Eat lower-sodium products, often labeled as "lower sodium" or "no salt added."  Eat fresh foods.  Eat more vegetables, fruits, and low-fat dairy products.  Choose whole grains. Look for the word "whole" as the first word in the ingredient list.  Choose fish   Limit sweets, desserts, sugars, and sugary drinks.  Choose heart-healthy fats.  Eat veggie cheese   Eat more home-cooked food and less restaurant, buffet, and fast food.  Limit fried foods.  Cook foods using methods other than frying.  Limit canned vegetables. If you do use them, rinse them well to decrease the sodium.  When eating at a restaurant, ask that your food be prepared with less salt, or no salt if possible.                      WHAT FOODS CAN I EAT? Read Dr Fara Olden Fuhrman's books on The End of Dieting & The End of Diabetes  Grains Whole grain or whole wheat bread. Brown rice. Whole grain or whole wheat pasta. Quinoa, bulgur, and whole grain cereals. Low-sodium cereals. Corn or whole wheat flour tortillas. Whole grain cornbread. Whole grain crackers. Low-sodium crackers.  Vegetables Fresh or frozen vegetables (raw, steamed, roasted, or grilled). Low-sodium  or reduced-sodium tomato and vegetable juices. Low-sodium or reduced-sodium tomato sauce and paste. Low-sodium or reduced-sodium canned vegetables.   Fruits All fresh, canned (in natural juice), or frozen fruits.  Protein Products  All fish and seafood.  Dried beans, peas, or lentils. Unsalted nuts and seeds. Unsalted canned beans.  Dairy Low-fat dairy products, such as skim or 1% milk, 2% or reduced-fat cheeses, low-fat ricotta or cottage cheese, or plain low-fat yogurt. Low-sodium or reduced-sodium cheeses.  Fats and Oils Tub margarines without trans fats. Light or reduced-fat mayonnaise and salad dressings (reduced sodium). Avocado. Safflower, olive, or canola oils. Natural peanut or almond butter.  Other Unsalted popcorn and pretzels. The items listed above may not be a complete list of recommended foods or beverages. Contact your dietitian for more options.  ++++++++++++++++++++  WHAT FOODS ARE NOT RECOMMENDED? Grains/ White flour or wheat flour White bread. White pasta. White rice. Refined cornbread. Bagels and croissants. Crackers that contain trans fat.  Vegetables  Creamed or fried vegetables. Vegetables in a . Regular canned vegetables. Regular canned tomato sauce and paste. Regular tomato and vegetable juices.  Fruits Dried fruits. Canned fruit in light or heavy syrup. Fruit juice.  Meat and Other Protein Products Meat in general - RED meat & White meat.  Fatty cuts of meat. Ribs, chicken wings, all processed meats as bacon, sausage, bologna, salami, fatback, hot dogs, bratwurst and packaged luncheon meats.  Dairy Whole or 2% milk, cream, half-and-half, and cream cheese. Whole-fat or sweetened yogurt. Full-fat cheeses or blue cheese. Non-dairy creamers and whipped toppings. Processed cheese, cheese spreads, or cheese curds.  Condiments Onion and garlic salt, seasoned salt, table salt, and sea salt. Canned and packaged gravies. Worcestershire sauce. Tartar sauce.  Barbecue sauce. Teriyaki sauce. Soy sauce, including reduced sodium. Steak sauce. Fish sauce. Oyster sauce. Cocktail sauce. Horseradish. Ketchup and mustard. Meat flavorings and tenderizers. Bouillon cubes. Hot sauce. Tabasco sauce. Marinades. Taco seasonings. Relishes.  Fats and Oils Butter, stick margarine, lard, shortening and bacon fat. Coconut, palm kernel, or palm oils. Regular salad dressings.  Pickles and olives. Salted popcorn and pretzels.  The items listed above may not be a complete list of foods and beverages to avoid.

## 2019-03-06 LAB — COMPLETE METABOLIC PANEL WITH GFR
AG Ratio: 2.2 (calc) (ref 1.0–2.5)
ALT: 24 U/L (ref 9–46)
AST: 18 U/L (ref 10–35)
Albumin: 4.1 g/dL (ref 3.6–5.1)
Alkaline phosphatase (APISO): 68 U/L (ref 35–144)
BUN: 20 mg/dL (ref 7–25)
CO2: 26 mmol/L (ref 20–32)
Calcium: 9.4 mg/dL (ref 8.6–10.3)
Chloride: 105 mmol/L (ref 98–110)
Creat: 1.03 mg/dL (ref 0.70–1.18)
GFR, Est African American: 81 mL/min/{1.73_m2} (ref >=60)
GFR, Est Non African American: 70 mL/min/{1.73_m2} (ref >=60)
Globulin: 1.9 g/dL (calc) (ref 1.9–3.7)
Glucose, Bld: 108 mg/dL — ABNORMAL HIGH (ref 65–99)
Potassium: 4.4 mmol/L (ref 3.5–5.3)
Sodium: 139 mmol/L (ref 135–146)
Total Bilirubin: 0.5 mg/dL (ref 0.2–1.2)
Total Protein: 6 g/dL — ABNORMAL LOW (ref 6.1–8.1)

## 2019-03-06 LAB — CBC WITH DIFFERENTIAL/PLATELET
Absolute Monocytes: 513 cells/uL (ref 200–950)
Basophils Absolute: 40 cells/uL (ref 0–200)
Basophils Relative: 0.7 %
Eosinophils Absolute: 80 cells/uL (ref 15–500)
Eosinophils Relative: 1.4 %
HCT: 39.2 % (ref 38.5–50.0)
Hemoglobin: 13.5 g/dL (ref 13.2–17.1)
Lymphs Abs: 2063 cells/uL (ref 850–3900)
MCH: 29 pg (ref 27.0–33.0)
MCHC: 34.4 g/dL (ref 32.0–36.0)
MCV: 84.3 fL (ref 80.0–100.0)
MPV: 10.4 fL (ref 7.5–12.5)
Monocytes Relative: 9 %
Neutro Abs: 3004 cells/uL (ref 1500–7800)
Neutrophils Relative %: 52.7 %
Platelets: 214 10*3/uL (ref 140–400)
RBC: 4.65 10*6/uL (ref 4.20–5.80)
RDW: 13.4 % (ref 11.0–15.0)
Total Lymphocyte: 36.2 %
WBC: 5.7 10*3/uL (ref 3.8–10.8)

## 2019-03-06 LAB — HEMOGLOBIN A1C
Hgb A1c MFr Bld: 7 % of total Hgb — ABNORMAL HIGH (ref <5.7)
Mean Plasma Glucose: 154 (calc)
eAG (mmol/L): 8.5 (calc)

## 2019-03-06 LAB — VITAMIN D 25 HYDROXY (VIT D DEFICIENCY, FRACTURES): Vit D, 25-Hydroxy: 67 ng/mL (ref 30–100)

## 2019-03-06 LAB — LIPID PANEL
Cholesterol: 116 mg/dL (ref <200)
HDL: 55 mg/dL (ref >=40)
LDL Cholesterol (Calc): 47 mg/dL (calc)
Non-HDL Cholesterol (Calc): 61 mg/dL (calc) (ref <130)
Total CHOL/HDL Ratio: 2.1 (calc) (ref <5.0)
Triglycerides: 65 mg/dL (ref <150)

## 2019-03-06 LAB — MAGNESIUM: Magnesium: 1.9 mg/dL (ref 1.5–2.5)

## 2019-03-06 LAB — TSH: TSH: 0.61 mIU/L (ref 0.40–4.50)

## 2019-03-06 LAB — INSULIN, RANDOM: Insulin: 11.7 u[IU]/mL

## 2019-03-11 NOTE — Progress Notes (Signed)
[facility] Summary Report / Loop Recorder 

## 2019-03-12 DIAGNOSIS — N476 Balanoposthitis: Secondary | ICD-10-CM | POA: Diagnosis not present

## 2019-03-12 DIAGNOSIS — N471 Phimosis: Secondary | ICD-10-CM | POA: Diagnosis not present

## 2019-03-28 ENCOUNTER — Encounter: Payer: Self-pay | Admitting: Physical Medicine & Rehabilitation

## 2019-03-28 ENCOUNTER — Other Ambulatory Visit: Payer: Self-pay

## 2019-03-28 ENCOUNTER — Encounter: Payer: Medicare Other | Attending: Physical Medicine & Rehabilitation | Admitting: Physical Medicine & Rehabilitation

## 2019-03-28 VITALS — Ht 71.0 in | Wt 187.0 lb

## 2019-03-28 DIAGNOSIS — I6930 Unspecified sequelae of cerebral infarction: Secondary | ICD-10-CM | POA: Insufficient documentation

## 2019-03-28 DIAGNOSIS — R269 Unspecified abnormalities of gait and mobility: Secondary | ICD-10-CM

## 2019-03-28 DIAGNOSIS — R42 Dizziness and giddiness: Secondary | ICD-10-CM

## 2019-03-28 DIAGNOSIS — I951 Orthostatic hypotension: Secondary | ICD-10-CM

## 2019-03-28 NOTE — Progress Notes (Signed)
Subjective:    Patient ID: Earl [name], male    DOB: 21-Jan-1941, 78 y.o.   MRN: [id number]  TELEHEALTH NOTE  Due to national recommendations of social distancing due to COVID 19, an audio/video telehealth visit is felt to be most appropriate for this patient at this time.  See Chart message from today for the patient's consent to telehealth from [place].     I verified that I am speaking with the correct person using two identifiers.  Location of patient: Home Location of provider: Office Method of communication: Telephone Names of participants : Zorita Pang scheduling, Marland Mcalpine obtaining conse good morning this is Dr. Posey Pronto is Earl [name] there How are you Nt and vitals if available Established patient Time spent on call: 16 minutes.  HPI Male with history of  HTN, questionable history PAF, T2DM presents for follow up for cerebellar stroke.   Last clinic visit 04/05/2018.  Since that time, he is active, and restarted doing HEP.  He had a CT, personally reviewed, which did not show acute changes. He went to PT twice.  He notes intermittent heaviness in his leg and intermittent "ooziness", which appears to be orthostasis. Denies LE edema. Denies falls. Using cane for long distances.   Pain Inventory Average Pain 0 Pain Right Now 0 My pain is no pain  In the last 24 hours, has pain interfered with the following? General activity 0 Relation with others 0 Enjoyment of life 0 What TIME of day is your pain at its worst? no pain Sleep (in general) no pain  Pain is worse with: no pain Pain improves with: no pain Relief from Meds: no pain  Mobility walk with assistance ability to climb steps?  yes do you drive?  yes  Function retired  Neuro/Psych trouble walking dizziness  Prior Studies Any changes since last visit?  yes CT/MRI  Physicians involved in your care Any changes since last visit?  no   Family History   Problem Relation Age of Onset  . Sickle cell anemia Mother   . Diabetes Father   . Stomach cancer Father   . Colon cancer Neg Hx   . CAD Neg Hx    Social History   Socioeconomic History  . Marital status: Married    Spouse name: Not on file  . Number of children: 2  . Years of education: Not on file  . Highest education level: Not on file  Occupational History  . Occupation: Herbalist: RETIRED  Social Needs  . Financial resource strain: Not on file  . Food insecurity:    Worry: Not on file    Inability: Not on file  . Transportation needs:    Medical: Not on file    Non-medical: Not on file  Tobacco Use  . Smoking status: Never Smoker  . Smokeless tobacco: Never Used  Substance and Sexual Activity  . Alcohol use: No  . Drug use: No  . Sexual activity: Not on file  Lifestyle  . Physical activity:    Days per week: Not on file    Minutes per session: Not on file  . Stress: Not on file  Relationships  . Social connections:    Talks on phone: Not on file    Gets together: Not on file    Attends religious service: Not on file    Active member of club or organization: Not on file  Attends meetings of clubs or organizations: Not on file    Relationship status: Not on file  Other Topics Concern  . Not on file  Social History Narrative  . Not on file   Past Surgical History:  Procedure Laterality Date  . INGUINAL HERNIA REPAIR Bilateral    1968 and 2004  . LOOP RECORDER INSERTION N/A 07/28/2017   Procedure: LOOP RECORDER INSERTION;  Surgeon: Deboraha Sprang, MD;  Location: [facility];  Service: Cardiovascular;  Laterality: N/A;  . TEE WITHOUT CARDIOVERSION N/A 07/28/2017   Procedure: TRANSESOPHAGEAL ECHOCARDIOGRAM (TEE);  Surgeon: Sueanne Margarita, MD;  Location: [facility] ENDOSCOPY;  Service: Cardiovascular;  Laterality: N/A;   Past Medical History:  Diagnosis Date  . Adenomatous colon polyp   . Anal fissure   . BPH (benign  prostatic hyperplasia)   . Diabetes mellitus ([place])   . Hyperlipidemia   . Hypertension   . Hypothyroidism   . Other testicular hypofunction   . Stroke ([place])   . Type II or unspecified type diabetes mellitus without mention of complication, not stated as uncontrolled    Ht 5\' 11"  (1.803 m)   Wt 187 lb (84.8 kg)   BMI 26.08 kg/m   Opioid Risk Score:   Fall Risk Score:  `1  Depression screen PHQ 2/9  Depression screen [facility] 2/9 03/05/2019 01/22/2019 11/26/2018 05/13/2018 04/05/2018 12/21/2017 10/06/2017  Decreased Interest 0 0 0 0 0 0 0  Down, Depressed, Hopeless 0 0 0 0 0 0 0  PHQ - 2 Score 0 0 0 0 0 0 0  Altered sleeping - - - - - - -  Tired, decreased energy - - - - - - -  Change in appetite - - - - - - -  Feeling bad or failure about yourself  - - - - - - -  Trouble concentrating - - - - - - -  Moving slowly or fidgety/restless - - - - - - -  Suicidal thoughts - - - - - - -  PHQ-9 Score - - - - - - -  Difficult doing work/chores - - - - - - -    Review of Systems  Constitutional: Negative.   HENT: Negative.   Eyes: Negative.   Respiratory: Negative.   Cardiovascular: Negative.   Gastrointestinal: Negative.   Endocrine: Negative.   Genitourinary: Negative.   Musculoskeletal: Negative.   Skin: Negative.   Allergic/Immunologic: Negative.   Neurological: Positive for dizziness.       Dizziness when standing from a sitting position    Hematological: Negative.   Psychiatric/Behavioral: Negative.   All other systems reviewed and are negative.     Objective:   Physical Exam Gen: NAD. Pulm: Effort normal. Neuro: Alert and oriented    Assessment & Plan:  Male with history of  HTN, questionable history PAF, T2DM presents for follow up for cerebellar stroke.   1.  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 Gait abnormality  Resume HEP  Cont cane for safety for long dizziness  3. Orthostasis    Encouraged slow positional changed  Encouraged appropriate hydration  Encouraged TEDS if necessary  Cont follow Cards

## 2019-03-29 ENCOUNTER — Encounter: Payer: Medicare Other | Admitting: Physical Medicine & Rehabilitation

## 2019-04-02 ENCOUNTER — Ambulatory Visit (INDEPENDENT_AMBULATORY_CARE_PROVIDER_SITE_OTHER): Payer: Medicare Other | Admitting: *Deleted

## 2019-04-02 ENCOUNTER — Other Ambulatory Visit: Payer: Self-pay

## 2019-04-02 DIAGNOSIS — I639 Cerebral infarction, unspecified: Secondary | ICD-10-CM | POA: Diagnosis not present

## 2019-04-03 LAB — CUP PACEART REMOTE DEVICE CHECK
Date Time Interrogation Session: 20200512133646
Implantable Pulse Generator Implant Date: 20180907

## 2019-04-05 ENCOUNTER — Other Ambulatory Visit: Payer: Self-pay | Admitting: Internal Medicine

## 2019-04-05 ENCOUNTER — Ambulatory Visit: Payer: Medicare Other | Admitting: Physical Medicine & Rehabilitation

## 2019-04-12 DIAGNOSIS — N471 Phimosis: Secondary | ICD-10-CM | POA: Diagnosis not present

## 2019-04-12 DIAGNOSIS — N476 Balanoposthitis: Secondary | ICD-10-CM | POA: Diagnosis not present

## 2019-04-12 DIAGNOSIS — N390 Urinary tract infection, site not specified: Secondary | ICD-10-CM | POA: Diagnosis not present

## 2019-04-17 ENCOUNTER — Other Ambulatory Visit: Payer: Self-pay | Admitting: Internal Medicine

## 2019-04-19 NOTE — Progress Notes (Signed)
[facility] Summary Report / Loop Recorder 

## 2019-05-06 ENCOUNTER — Ambulatory Visit (INDEPENDENT_AMBULATORY_CARE_PROVIDER_SITE_OTHER): Payer: Medicare Other | Admitting: *Deleted

## 2019-05-06 DIAGNOSIS — I639 Cerebral infarction, unspecified: Secondary | ICD-10-CM | POA: Diagnosis not present

## 2019-05-06 LAB — CUP PACEART REMOTE DEVICE CHECK
Date Time Interrogation Session: 20200614154036
Implantable Pulse Generator Implant Date: 20180907

## 2019-05-09 ENCOUNTER — Other Ambulatory Visit: Payer: Self-pay

## 2019-05-09 ENCOUNTER — Telehealth: Payer: Self-pay | Admitting: Internal Medicine

## 2019-05-09 ENCOUNTER — Other Ambulatory Visit: Payer: Self-pay | Admitting: Urology

## 2019-05-09 ENCOUNTER — Encounter: Payer: Medicare Other | Attending: Physical Medicine & Rehabilitation | Admitting: Physical Medicine & Rehabilitation

## 2019-05-09 ENCOUNTER — Encounter: Payer: Self-pay | Admitting: Physical Medicine & Rehabilitation

## 2019-05-09 VITALS — BP 145/78 | HR 91 | Temp 99.0°F | Ht 71.0 in | Wt 186.0 lb

## 2019-05-09 DIAGNOSIS — Z800 Family history of malignant neoplasm of digestive organs: Secondary | ICD-10-CM | POA: Insufficient documentation

## 2019-05-09 DIAGNOSIS — Z8673 Personal history of transient ischemic attack (TIA), and cerebral infarction without residual deficits: Secondary | ICD-10-CM | POA: Diagnosis not present

## 2019-05-09 DIAGNOSIS — I951 Orthostatic hypotension: Secondary | ICD-10-CM

## 2019-05-09 DIAGNOSIS — Z833 Family history of diabetes mellitus: Secondary | ICD-10-CM | POA: Insufficient documentation

## 2019-05-09 DIAGNOSIS — I480 Paroxysmal atrial fibrillation: Secondary | ICD-10-CM | POA: Diagnosis not present

## 2019-05-09 DIAGNOSIS — Z806 Family history of leukemia: Secondary | ICD-10-CM | POA: Insufficient documentation

## 2019-05-09 DIAGNOSIS — I10 Essential (primary) hypertension: Secondary | ICD-10-CM | POA: Insufficient documentation

## 2019-05-09 DIAGNOSIS — R269 Unspecified abnormalities of gait and mobility: Secondary | ICD-10-CM | POA: Diagnosis not present

## 2019-05-09 DIAGNOSIS — I6930 Unspecified sequelae of cerebral infarction: Secondary | ICD-10-CM | POA: Diagnosis not present

## 2019-05-09 DIAGNOSIS — I639 Cerebral infarction, unspecified: Secondary | ICD-10-CM | POA: Diagnosis not present

## 2019-05-09 DIAGNOSIS — Z7409 Other reduced mobility: Secondary | ICD-10-CM | POA: Diagnosis not present

## 2019-05-09 DIAGNOSIS — R42 Dizziness and giddiness: Secondary | ICD-10-CM

## 2019-05-09 DIAGNOSIS — E119 Type 2 diabetes mellitus without complications: Secondary | ICD-10-CM | POA: Diagnosis not present

## 2019-05-09 DIAGNOSIS — R262 Difficulty in walking, not elsewhere classified: Secondary | ICD-10-CM | POA: Insufficient documentation

## 2019-05-09 NOTE — Telephone Encounter (Signed)
New Message             [facility] Pre-operative Risk Assessment    Request for surgical clearance:  1. What type of surgery is being performed? Circumcision   2. When is this surgery scheduled? Pending clearance  3. What type of clearance is required (medical clearance vs. Pharmacy clearance to hold med vs. Both)? Both  4. Are there any medications that need to be held prior to surgery and how long?  Eliquis 2-3 days   5. Practice name and name of physician performing surgery? [facility]/ Dr. Kendal Hymen  6. What is your office phone number [phone]   7.   What is your office fax number [phone]  8.   Anesthesia type (None, local, MAC, general) ? Choice   Earl [name] 05/09/2019, 11:29 AM  _________________________________________________________________   (provider comments below)

## 2019-05-09 NOTE — Telephone Encounter (Signed)
Patient with diagnosis of afib on Elqiuis for anticoagulation.    Procedure: Circumcision  Date of procedure: TBD  CHADS2-VASc score of  6 (CHF, HTN, AGE, DM2, stroke/tia x 2, CAD, AGE, male)  CrCl 66 ml/min  Office protocol would be to hold Eliqiuis 2 days prior to procedure. Due to hx of stroke patient is at increased risk off anticoagulation. I will route to MD for input.

## 2019-05-09 NOTE — Progress Notes (Signed)
Subjective:    Patient ID: Earl [name], male    DOB: January 05, 1941, 78 y.o.   MRN: [id number]   HPI Male with history of  HTN, questionable history PAF, T2DM presents for follow up for cerebellar stroke.   Last clinic visit 03/28/2019.  Since that time, he states he has not starting doing much exercise but plans to walk more. Denies falls. Using cane. Orthostasis has improved.   Pain Inventory Average Pain 0 Pain Right Now 0 My pain is no pain  In the last 24 hours, has pain interfered with the following? General activity 0 Relation with others 0 Enjoyment of life 0 What TIME of day is your pain at its worst? no pain Sleep (in general) no pain  Pain is worse with: no pain Pain improves with: no pain Relief from Meds: no pain  Mobility walk with assistance use a cane ability to climb steps?  yes do you drive?  yes  Function retired  Neuro/Psych trouble walking dizziness  Prior Studies Any changes since last visit?  yes CT/MRI  Physicians involved in your care Any changes since last visit?  no   Family History  Problem Relation Age of Onset  . Sickle cell anemia Mother   . Diabetes Father   . Stomach cancer Father   . Colon cancer Neg Hx   . CAD Neg Hx    Social History   Socioeconomic History  . Marital status: Married    Spouse name: Not on file  . Number of children: 2  . Years of education: Not on file  . Highest education level: Not on file  Occupational History  . Occupation: Herbalist: RETIRED  Social Needs  . Financial resource strain: Not on file  . Food insecurity    Worry: Not on file    Inability: Not on file  . Transportation needs    Medical: Not on file    Non-medical: Not on file  Tobacco Use  . Smoking status: Never Smoker  . Smokeless tobacco: Never Used  Substance and Sexual Activity  . Alcohol use: No  . Drug use: No  . Sexual activity: Not on file  Lifestyle  . Physical  activity    Days per week: Not on file    Minutes per session: Not on file  . Stress: Not on file  Relationships  . Social Herbalist on phone: Not on file    Gets together: Not on file    Attends religious service: Not on file    Active member of club or organization: Not on file    Attends meetings of clubs or organizations: Not on file    Relationship status: Not on file  Other Topics Concern  . Not on file  Social History Narrative  . Not on file   Past Surgical History:  Procedure Laterality Date  . INGUINAL HERNIA REPAIR Bilateral    1968 and 2004  . LOOP RECORDER INSERTION N/A 07/28/2017   Procedure: LOOP RECORDER INSERTION;  Surgeon: Deboraha Sprang, MD;  Location: [facility];  Service: Cardiovascular;  Laterality: N/A;  . TEE WITHOUT CARDIOVERSION N/A 07/28/2017   Procedure: TRANSESOPHAGEAL ECHOCARDIOGRAM (TEE);  Surgeon: Sueanne Margarita, MD;  Location: [facility] ENDOSCOPY;  Service: Cardiovascular;  Laterality: N/A;   Past Medical History:  Diagnosis Date  . Adenomatous colon polyp   . Anal fissure   . BPH (benign prostatic hyperplasia)   .  Diabetes mellitus ([place])   . Hyperlipidemia   . Hypertension   . Hypothyroidism   . Other testicular hypofunction   . Stroke ([place])   . Type II or unspecified type diabetes mellitus without mention of complication, not stated as uncontrolled    BP (!) 145/78   Pulse 91   Temp 99 F (37.2 C)   Ht 5\' 11"  (1.803 m)   Wt 186 lb (84.4 kg)   SpO2 99%   BMI 25.94 kg/m   Opioid Risk Score:   Fall Risk Score:  `1  Depression screen PHQ 2/9  Depression screen [facility] 2/9 03/05/2019 01/22/2019 11/26/2018 05/13/2018 04/05/2018 12/21/2017 10/06/2017  Decreased Interest 0 0 0 0 0 0 0  Down, Depressed, Hopeless 0 0 0 0 0 0 0  PHQ - 2 Score 0 0 0 0 0 0 0  Altered sleeping - - - - - - -  Tired, decreased energy - - - - - - -  Change in appetite - - - - - - -  Feeling bad or failure about yourself  - - - - - - -  Trouble  concentrating - - - - - - -  Moving slowly or fidgety/restless - - - - - - -  Suicidal thoughts - - - - - - -  PHQ-9 Score - - - - - - -  Difficult doing work/chores - - - - - - -    Review of Systems  Constitutional: Negative.   HENT: Negative.   Eyes: Negative.   Respiratory: Negative.   Cardiovascular: Negative.   Gastrointestinal: Negative.   Endocrine: Negative.   Genitourinary: Negative.   Musculoskeletal: Positive for gait problem.  Skin: Negative.   Allergic/Immunologic: Negative.   Neurological:       Dizziness when standing from a sitting position    Hematological: Negative.   Psychiatric/Behavioral: Negative.   All other systems reviewed and are negative.     Objective:   Physical Exam Constitutional: He appears well-developedand well-nourished. No distress.  HENT: Normocephalicand atraumatic.  Eyes: EOMare normal. No discharge.  Cardiovascular: No JVD. Respiratory: Normal effort. GI: He exhibits no distension.  Musculoskeletal: Slow cadence with mild hemiplegic gait. Neurological: He is alert and oriented.  Speech clear.  Motor: Grossly 5/5 throughout Skin: Skin is warmand dry. He is not diaphoretic.  Psychiatric: He has a normal mood and affect. His behavior is normal. Judgmentand thought contentnormal.   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   1.  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 Gait abnormality  Resume HEP, encouraged again  Cont cane for safety  3. Orthostasis - improving   Cont slow positional changed  Cont appropriate hydration  Encouraged TEDS if necessary  Cont follow Cards

## 2019-05-10 ENCOUNTER — Telehealth: Payer: Self-pay | Admitting: Gastroenterology

## 2019-05-10 NOTE — Telephone Encounter (Signed)
Lets  Hold night before and morning of    Any bleeding is controllalbe under direct visualization

## 2019-05-10 NOTE — Telephone Encounter (Signed)
Dr Havery Moros, pt was seen back in Oct of 2019. Earl [name] was  To have a EGD/Colon on 11- 5- 2020. looks like Earl [name] cancelled.  Would you like to set up a tele visit prior to rescheduling his procedure?

## 2019-05-10 NOTE — Telephone Encounter (Signed)
Pt's son called and stated that pt was supposed to be rescheduled for EGD/col but has not received a call.  From OV notes 08/2018, pt is on Eliquis and needed to obtain clearance. Please advise scheduling.

## 2019-05-13 NOTE — Telephone Encounter (Signed)
Due to patients hx of stroke and low risk of bleeding with a EGD/colonoscopy, our office policy would recommend holding only 1 day.   Dr. Havery Moros, would you be willing to do procedure with patient holding Eliquis 1 day?

## 2019-05-13 NOTE — Telephone Encounter (Signed)
Thanks Nicole Kindred, I saw this patient last year and spoke with his PCP Dr. Melford Aase about it and they had wanted to proceed with EGD and colonoscopy, he was cleared to hold Eliquis for 2 days. As long as he has not had any significant changes to his health I think okay to proceed with these at the [facility], can be scheduled, will need approval to hold Eliquis for 2 days prior to the exam. Thanks

## 2019-05-13 NOTE — Telephone Encounter (Signed)
Per MD. Patient may hold 1 day prior to procedure (MD requested holding night before and AM of)

## 2019-05-13 NOTE — Telephone Encounter (Signed)
Thanks for asking. Given his renal function is okay, I would be okay with a 1 day hold given his stroke risk, but it would have to be at least 24 hours held. If we remove polyps with colonoscopy and the Eliquis is not out of his system, the bleeding risk can be high. Thanks

## 2019-05-13 NOTE — Telephone Encounter (Signed)
Pt's son called our cardiology office to check on holding Eliquis for EGD/colonoscopy.   Because of the patient's history of stroke, Dr. Caryl Comes has recommended to limit the time that anticoagulation is held. He recommended to only hold prior evening dose and am dose for upcoming circumcision.   Apparently by OV note from 08/28/2018, there was discussion of the patient's increased risk for stroke while anticoagulation held between the providers and the patient and the consensus was that the patient understood his risk and wished to proceed.  We will again advise that anticoagulation interruption be minimized.  I will route to our pharmacy for further input.

## 2019-05-13 NOTE — Telephone Encounter (Signed)
   Primary Cardiologist: Dr. Caryl Comes  Chart reviewed as part of pre-operative protocol coverage. Patient's son Earl [name] was contacted 05/13/2019 in reference to pre-operative risk assessment for pending surgery as outlined below.  Earl [name] was last seen on 12/21/2018 by Dr. Caryl Comes.  Since that day, Earl [name] has no had any new complaints. No chest pain, shortness of breath, palpitations or syncope.  Therefore, based on ACC/AHA guidelines, the patient would be at acceptable risk for the planned procedure without further cardiovascular testing.    Patient with diagnosis of afib on Elqiuis for anticoagulation.    Procedure: Circumcision Date of procedure: TBD  CHADS2-VASc score of  6 (CHF, HTN, AGE, DM2, stroke/tia x 2, CAD, AGE, male)  CrCl 66 ml/min  Office protocol would be to hold Eliqiuis 2 days prior to procedure, however, due to hx of stroke patient is at increased risk off anticoagulation.  Per Dr. Caryl Comes, Earl [name] to hold Eliquis the night before and morning of the procedure. Any bleeding is controllable under direct visualization.   I will route this recommendation to the requesting party via Epic fax function and remove from pre-op pool.  Please call with questions.  Earl Perch, NP 05/13/2019, 9:20 AM

## 2019-05-14 ENCOUNTER — Telehealth: Payer: Self-pay | Admitting: Gastroenterology

## 2019-05-14 ENCOUNTER — Other Ambulatory Visit: Payer: Self-pay

## 2019-05-14 DIAGNOSIS — Z800 Family history of malignant neoplasm of digestive organs: Secondary | ICD-10-CM

## 2019-05-14 NOTE — Telephone Encounter (Signed)
Ok to hold Eliquis for 24 hours prior to procedure.

## 2019-05-14 NOTE — Telephone Encounter (Signed)
Yes can be done at the [facility], can book in the first available slot. Thanks

## 2019-05-14 NOTE — Telephone Encounter (Signed)
Patient son called in wanting to follow up with the nurse about father's clearance to get him schedule for a procedure please call and advise thanks.

## 2019-05-14 NOTE — Telephone Encounter (Signed)
Son called in wanting to schedule a Colonoscopy for Patient. We received the clearance for holding his Eliquis yesterday. Can he be scheduled for one here in the [place] ? Please advise.

## 2019-05-14 NOTE — Telephone Encounter (Signed)
Patient scheduled for colonoscopy 05/21/19 @ 9:00am in the [facility]. Pre-Visit 05/17/19 @1 :30pm (phone). Called patient's son and notified of the above and he is good with this.

## 2019-05-14 NOTE — Telephone Encounter (Signed)
   Primary Cardiologist:Steven Caryl Comes, MD  Chart reviewed as part of pre-operative protocol coverage. Pre-op clearance already addressed by colleagues in earlier phone notes. To summarize recommendations:  - Pecolia Ades spoke with pt on 05/13/19 regarding clearance for a different procedure (circumcision) and cleared him medically for that procedure. Therefore the medical clearance also applies here.  - Per Fuller Canada, pharmD, "Ok to hold Eliquis for 24 hours prior to procedure."  It appears GI has already internally routed this message to their own staffing group so will remove this message from pre-op APP box.  Charlie Pitter, PA-C 05/14/2019, 9:21 AM

## 2019-05-16 NOTE — Progress Notes (Signed)
[facility] Summary Report / Loop Recorder 

## 2019-05-17 ENCOUNTER — Ambulatory Visit (AMBULATORY_SURGERY_CENTER): Payer: Self-pay

## 2019-05-17 ENCOUNTER — Other Ambulatory Visit: Payer: Self-pay

## 2019-05-17 ENCOUNTER — Telehealth: Payer: Self-pay

## 2019-05-17 VITALS — Ht 72.0 in | Wt 190.0 lb

## 2019-05-17 DIAGNOSIS — Z86010 Personal history of colonic polyps: Secondary | ICD-10-CM

## 2019-05-17 MED ORDER — NA SULFATE-K SULFATE-MG SULF 17.5-3.13-1.6 GM/177ML PO SOLN: 1.0000 | Freq: Once | ORAL | 0 refills | Status: AC

## 2019-05-17 NOTE — Progress Notes (Signed)
Denies allergies to eggs or soy products. Denies complication of anesthesia or sedation. Denies use of weight loss medication. Denies use of O2.   Emmi instructions given for colonoscopy.  Pre-visit was conducted by phone due to Covid 19. Du to patients recent stroke [name] is the patients son and he was the person receiving all instructions for his father. [name] is going to be with his father to help oversee preparation for procedures. Instructions were scanned to [facility].

## 2019-05-17 NOTE — Telephone Encounter (Signed)
-----   Message from Yetta Flock, MD sent at 05/17/2019  9:01 AM EDT ----- Earl [name] yes he needs the EGD and colonoscopy, however just add the EGD to the schedule to do same time as the colon and do NOT cancel the appointment. If you can let endo know we are adding the EGD to his case, I am okay with it. He will be holding eliquis for this exam, not worth rescheduling. Thanks for noticing this   ----- Message ----- From: Hughie Closs, RN Sent: 05/17/2019   7:59 AM EDT To: Yetta Flock, MD  If a n EGD/Colon they will have to reschedule. His COLON is scheduled for this Tues. 05/21/19

## 2019-05-20 ENCOUNTER — Telehealth: Payer: Self-pay | Admitting: Gastroenterology

## 2019-05-20 NOTE — Telephone Encounter (Signed)
Spoke with patient's son regarding Covid-19 questions Covid-19 Screening Questions   Do you now or have you had a fever in the last 14 days?     No  Do you have any respiratory symptoms of shortness of breath or cough now or in the last 14 days?    No  Do you have any family members or close contacts with diagnosed or suspected Covid-19 in the past 14 days?     No  Have you been tested for Covid-19 and found to be positive?    No  Pt made aware of that care partner may wait in the car or come up to the lobby during the procedure but will need to provide their own mask.

## 2019-05-21 ENCOUNTER — Ambulatory Visit (AMBULATORY_SURGERY_CENTER): Payer: Medicare Other | Admitting: Gastroenterology

## 2019-05-21 ENCOUNTER — Encounter: Payer: Self-pay | Admitting: Gastroenterology

## 2019-05-21 ENCOUNTER — Other Ambulatory Visit: Payer: Self-pay

## 2019-05-21 VITALS — BP 132/71 | HR 71 | Temp 97.7°F | Resp 20 | Ht 71.0 in | Wt 182.0 lb

## 2019-05-21 DIAGNOSIS — D122 Benign neoplasm of ascending colon: Secondary | ICD-10-CM | POA: Diagnosis not present

## 2019-05-21 DIAGNOSIS — K2980 Duodenitis without bleeding: Secondary | ICD-10-CM

## 2019-05-21 DIAGNOSIS — Z800 Family history of malignant neoplasm of digestive organs: Secondary | ICD-10-CM | POA: Diagnosis not present

## 2019-05-21 DIAGNOSIS — Z86010 Personal history of colonic polyps: Secondary | ICD-10-CM | POA: Diagnosis not present

## 2019-05-21 DIAGNOSIS — K210 Gastro-esophageal reflux disease with esophagitis: Secondary | ICD-10-CM | POA: Diagnosis not present

## 2019-05-21 DIAGNOSIS — D125 Benign neoplasm of sigmoid colon: Secondary | ICD-10-CM | POA: Diagnosis not present

## 2019-05-21 DIAGNOSIS — D120 Benign neoplasm of cecum: Secondary | ICD-10-CM

## 2019-05-21 DIAGNOSIS — K2950 Unspecified chronic gastritis without bleeding: Secondary | ICD-10-CM | POA: Diagnosis not present

## 2019-05-21 MED ORDER — OMEPRAZOLE 20 MG PO CPDR: 20.0000 mg | DELAYED_RELEASE_CAPSULE | Freq: Every day | ORAL | 0 refills | Status: DC

## 2019-05-21 MED ORDER — SODIUM CHLORIDE 0.9 % IV SOLN: 500.0000 mL | Freq: Once | INTRAVENOUS | Status: DC

## 2019-05-21 NOTE — Progress Notes (Signed)
Pt's states no medical or surgical changes since previsit or office visit.  Patient for ENDO/COLON today per Dr Wilma Flavin( see TE)

## 2019-05-21 NOTE — Op Note (Signed)
[facility] Patient Name: Earl [name] Procedure Date: 05/21/2019 9:20 AM MRN: [id number] Endoscopist: [name] , MD Age: 78 Referring MD:  Date of Birth: 11/16/1941 Gender: Male Account #: [id number] Procedure:                Colonoscopy Indications:              Surveillance: Personal history of adenomatous                            polyps on last colonoscopy > 5 years ago Medicines:                Monitored Anesthesia Care Procedure:                Pre-Anesthesia Assessment:                           - Prior to the procedure, a History and Physical                            was performed, and patient medications and                            allergies were reviewed. The patient's tolerance of                            previous anesthesia was also reviewed. The risks                            and benefits of the procedure and the sedation                            options and risks were discussed with the patient.                            All questions were answered, and informed consent                            was obtained. Prior Anticoagulants: The patient has                            taken Eliquis (apixaban), last dose was 2 days                            prior to procedure. ASA Grade Assessment: III - A                            patient with severe systemic disease. After                            reviewing the risks and benefits, the patient was                            deemed in satisfactory condition to undergo the  procedure.                           After obtaining informed consent, the colonoscope                            was passed under direct vision. Throughout the                            procedure, the patient's blood pressure, pulse, and                            oxygen saturations were monitored continuously. The                            Colonoscope was introduced through the anus and                             advanced to the the cecum, identified by                            appendiceal orifice and ileocecal valve. The                            colonoscopy was performed without difficulty. The                            patient tolerated the procedure well. The quality                            of the bowel preparation was good. The ileocecal                            valve, appendiceal orifice, and rectum were                            photographed. Scope In: 9:37:13 AM Scope Out: 9:55:11 AM Scope Withdrawal Time: 0 hours 13 minutes 11 seconds  Total Procedure Duration: 0 hours 17 minutes 58 seconds  Findings:                 The perianal and digital rectal examinations were                            normal.                           A 4 mm polyp was found in the cecum. The polyp was                            sessile. The polyp was removed with a cold snare.                            Resection and retrieval were complete.  A diminutive polyp was found in the ascending                            colon. The polyp was sessile. The polyp was removed                            with a cold snare. Resection and retrieval were                            complete.                           A 3 mm polyp was found in the sigmoid colon. The                            polyp was sessile. The polyp was removed with a                            cold snare. Resection and retrieval were complete.                           Scattered small-mouthed diverticula were found in                            the transverse colon and left colon.                           Internal hemorrhoids were found during retroflexion.                           The exam was otherwise without abnormality. Complications:            No immediate complications. Estimated blood loss:                            Minimal. Estimated Blood Loss:     Estimated blood loss was  minimal. Impression:               - One 4 mm polyp in the cecum, removed with a cold                            snare. Resected and retrieved.                           - One diminutive polyp in the ascending colon,                            removed with a cold snare. Resected and retrieved.                           - One 3 mm polyp in the sigmoid colon, removed with                            a cold snare. Resected and retrieved.                           -  Diverticulosis in the transverse colon and in the                            left colon.                           - Internal hemorrhoids.                           - The examination was otherwise normal. Recommendation:           - Patient has a contact number available for                            emergencies. The signs and symptoms of potential                            delayed complications were discussed with the                            patient. Return to normal activities tomorrow.                            Written discharge instructions were provided to the                            patient.                           - Resume previous diet.                           - Continue present medications.                           - Await pathology results.                           - Resume Eliquis tomorrow Carlota Raspberry. Armbruster, MD 05/21/2019 9:59:44 AM This report has been signed electronically.

## 2019-05-21 NOTE — Patient Instructions (Signed)
Discharge instructions given. Handouts on polyps,diverticulosis and hemorrhoids. Resume previous today. Resume Eliquis tomorrow. Prescription sent to pharmacy. YOU HAD AN ENDOSCOPIC PROCEDURE TODAY AT [facility]:   Refer to the procedure report that was given to you for any specific questions about what was found during the examination.  If the procedure report does not answer your questions, please call your gastroenterologist to clarify.  If you requested that your care partner not be given the details of your procedure findings, then the procedure report has been included in a sealed envelope for you to review at your convenience later.  YOU SHOULD EXPECT: Some feelings of bloating in the abdomen. Passage of more gas than usual.  Walking can help get rid of the air that was put into your GI tract during the procedure and reduce the bloating. If you had a lower endoscopy (such as a colonoscopy or flexible sigmoidoscopy) you may notice spotting of blood in your stool or on the toilet paper. If you underwent a bowel prep for your procedure, you may not have a normal bowel movement for a few days.  Please Note:  You might notice some irritation and congestion in your nose or some drainage.  This is from the oxygen used during your procedure.  There is no need for concern and it should clear up in a day or so.  SYMPTOMS TO REPORT IMMEDIATELY:   Following lower endoscopy (colonoscopy or flexible sigmoidoscopy):  Excessive amounts of blood in the stool  Significant tenderness or worsening of abdominal pains  Swelling of the abdomen that is new, acute  Fever of 100F or higher   Following upper endoscopy (EGD)  Vomiting of blood or coffee ground material  New chest pain or pain under the shoulder blades  Painful or persistently difficult swallowing  New shortness of breath  Fever of 100F or higher  Black, tarry-looking stools  For urgent or emergent issues, a  gastroenterologist can be reached at any hour by calling [phone].   DIET:  We do recommend a small meal at first, but then you may proceed to your regular diet.  Drink plenty of fluids but you should avoid alcoholic beverages for 24 hours.  ACTIVITY:  You should plan to take it easy for the rest of today and you should NOT DRIVE or use heavy machinery until tomorrow (because of the sedation medicines used during the test).    FOLLOW UP: Our staff will call the number listed on your records 48-72 hours following your procedure to check on you and address any questions or concerns that you may have regarding the information given to you following your procedure. If we do not reach you, we will leave a message.  We will attempt to reach you two times.  During this call, we will ask if you have developed any symptoms of COVID 19. If you develop any symptoms (ie: fever, flu-like symptoms, shortness of breath, cough etc.) before then, please call [phone].  If you test positive for Covid 19 in the 2 weeks post procedure, please call and report this information to [place].    If any biopsies were taken you will be contacted by phone or by letter within the next 1-3 weeks.  Please call us at [phone] if you have not heard about the biopsies in 3 weeks.    SIGNATURES/CONFIDENTIALITY: You and/or your care partner have signed paperwork which will be entered into your electronic medical record.  These signatures attest  to the fact that that the information above on your After Visit Summary has been reviewed and is understood.  Full responsibility of the confidentiality of this discharge information lies with you and/or your care-partner. 

## 2019-05-21 NOTE — Op Note (Signed)
[facility] Patient Name: Earl [name] Procedure Date: 05/21/2019 9:20 AM MRN: [id number] Endoscopist: [name] , MD Age: 78 Referring MD:  Date of Birth: 07-02-1941 Gender: Male Account #: [id number] Procedure:                Upper GI endoscopy Indications:              Family history of gastric cancer Medicines:                Monitored Anesthesia Care Procedure:                Pre-Anesthesia Assessment:                           - Prior to the procedure, a History and Physical                            was performed, and patient medications and                            allergies were reviewed. The patient's tolerance of                            previous anesthesia was also reviewed. The risks                            and benefits of the procedure and the sedation                            options and risks were discussed with the patient.                            All questions were answered, and informed consent                            was obtained. Prior Anticoagulants: The patient has                            taken Eliquis (apixaban), last dose was 2 days                            prior to procedure. ASA Grade Assessment: III - A                            patient with severe systemic disease. After                            reviewing the risks and benefits, the patient was                            deemed in satisfactory condition to undergo the                            procedure.  After obtaining informed consent, the endoscope was                            passed under direct vision. Throughout the                            procedure, the patient's blood pressure, pulse, and                            oxygen saturations were monitored continuously. The                            Endoscope was introduced through the mouth, and                            advanced to the second part of duodenum. The upper                             GI endoscopy was accomplished without difficulty.                            The patient tolerated the procedure well. Scope In: Scope Out: Findings:                 Esophagogastric landmarks were identified: the                            Z-line was found at 40 cm, the gastroesophageal                            junction was found at 40 cm and the upper extent of                            the gastric folds was found at 42 cm from the                            incisors.                           The Z-line was irregular and was found 40 cm from                            the incisors, with some inflammatory changes and                            slight nodularity. I suspect inflammatory changes                            only but biopsies were taken with a cold forceps                            for histology.  LA Grade B esophagitis was found at the GEJ.                           The exam of the esophagus was otherwise normal.                           The entire examined stomach was normal. Biopsies                            were taken with a cold forceps for Helicobacter                            pylori testing.                           The duodenal bulb and second portion of the                            duodenum were normal. Complications:            No immediate complications. Estimated blood loss:                            Minimal. Estimated Blood Loss:     Estimated blood loss was minimal. Impression:               - Esophagogastric landmarks identified.                           - Z-line irregular, 40 cm from the incisors.                            Biopsied.                           - LA Grade B reflux esophagitis.                           - Normal stomach. Biopsied.                           - Normal duodenal bulb and second portion of the                            duodenum. Recommendation:           - Patient  has a contact number available for                            emergencies. The signs and symptoms of potential                            delayed complications were discussed with the                            patient. Return to normal activities tomorrow.  Written discharge instructions were provided to the                            patient.                           - Resume previous diet.                           - Continue present medications. Resume Eliquis                            tomorrow                           - Start omeprazole 20mg  once daily for 6 weeks,                            then use as needed                           - Await pathology results. [name], MD 05/21/2019 10:03:50 AM This report has been signed electronically.

## 2019-05-21 NOTE — Progress Notes (Signed)
Called to room to assist during endoscopic procedure.  Patient ID and intended procedure confirmed with present staff. Received instructions for my participation in the procedure from the performing physician.  

## 2019-05-21 NOTE — Progress Notes (Signed)
A/ox3, pleased with MAC, report to RN 

## 2019-05-23 ENCOUNTER — Telehealth: Payer: Self-pay

## 2019-05-23 ENCOUNTER — Encounter: Payer: Self-pay | Admitting: Gastroenterology

## 2019-05-23 NOTE — Telephone Encounter (Signed)
  Follow up Call-  Call back number 05/21/2019  Post procedure Call Back phone  # [phone]  Permission to leave phone message Yes  Some recent data might be hidden     Patient questions:  Do you have a fever, pain , or abdominal swelling? No. Pain Score  0 *  Have you tolerated food without any problems? Yes.    Have you been able to return to your normal activities? Yes.    Do you have any questions about your discharge instructions: Diet   No. Medications  No. Follow up visit  No.  Do you have questions or concerns about your Care? No.  Actions: * If pain score is 4 or above: No action needed, pain <4.  1. Have you developed a fever since your procedure? no  2.   Have you had an respiratory symptoms (SOB or cough) since your procedure? no  3.   Have you tested positive for COVID 19 since your procedure no  4.   Have you had any family members/close contacts diagnosed with the COVID 19 since your procedure?  no   If yes to any of these questions please route to Joylene John, RN and Alphonsa Gin, Therapist, sports.

## 2019-05-31 ENCOUNTER — Encounter: Payer: Medicare Other | Admitting: Gastroenterology

## 2019-06-05 NOTE — Progress Notes (Signed)
EKG 12-21-18 Epic LOV DR Caryl Comes CARDIOLOGY 12-21-18 EPIC

## 2019-06-05 NOTE — Progress Notes (Addendum)
[facility]  [name], M.D.        Uvaldo Bristle. Silverio Lay, P.A.-C         Liane Com[address], [place]                [place], [place] [address] Telephone [phone] Telefax [phone] Annual  Screening/Preventative Visit  & Comprehensive Evaluation & Examination     This very nice 78 y.o. MBM presents for a Screening /Preventative Visit & comprehensive evaluation and management of multiple medical co-morbidities.  Patient has been followed for HTN, HLD, T2_NIDDM  and Vitamin D Deficiency. Patient has GERD controlled with judicious diet & his meds. 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    HTN predates circa 1980. Patient's BP has been controlled on Lisinopril.  Today's BP is at goal - 132/62. Patient denies any cardiac symptoms as chest pain, palpitations, shortness of breath, dizziness or ankle swelling. 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 He has been on Eliquis since.     Patient's hyperlipidemia is controlled with diet and Atorvaststin. Patient denies myalgias or other medication SE's. 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   Patient denies reactive hypoglycemic symptoms, visual blurring, diabetic polys or paresthesias. He has bee reticent to check blood glucose and still monitors DU's. Last A1c was not at goal: Lab Results  Component Value Date   HGBA1C 7.0 (H) 03/05/2019       Patient was started on Thyroid replacement in 2014.      Finally, patient has history of Vitamin D Deficiency and last vitamin D was at goal: Lab Results  Component Value Date   VD25OH 67  03/05/2019   Current Outpatient Medications on File Prior to Visit  Medication Sig  . ALPRAZolam (XANAX) 0.5 MG tablet Take 1/2-1 tablet 1 to 2 x /day ONLY if needed for Anxiety Attack &  limit to 5 days /week to avoid addiction (Patient taking differently: Take 0.25-0.5 mg by mouth 2 (two) times daily as needed for anxiety. Anxiety Attack &  limit to 5 days /week to avoid addiction)  . atorvastatin (LIPITOR) 40 MG tablet TAKE 1 TABLET BY MOUTH  DAILY AT 6 PM (Patient taking differently: Take 40 mg by mouth at bedtime. )  . Cholecalciferol (VITAMIN D PO) Take 1 tablet by mouth daily.   . Cyanocobalamin (B-12) 50 MCG TABS Take 50 mcg by mouth daily.  [name] ELIQUIS 5 MG TABS tablet TAKE 1 TABLET BY MOUTH TWO  TIMES DAILY (Patient taking differently: Take 5 mg by mouth 2 (two) times daily. )  . finasteride (PROSCAR) 5 MG tablet TAKE 1 TABLET BY MOUTH  EVERY DAY FOR PROSTATE (Patient taking differently: Take 5 mg by mouth at bedtime. )  . glipiZIDE (GLUCOTROL) 5 MG tablet TAKE 1 TABLET BY MOUTH 2  TIMES DAILY BEFORE MEALS (Patient taking differently: Take 5 mg by mouth 2 (two) times daily before a meal. )  . hydrocortisone (ANUSOL-HC) 2.5 % rectal cream Apply  rectally  2  to 3 x /daily (Patient taking differently: Place 1 application rectally every 8 (eight) hours as needed for anal itching. )  . levothyroxine (SYNTHROID) 50 MCG tablet TAKE 1 TABLET BY MOUTH  DAILY BEFORE BREAKFAST (Patient taking differently: Take 50 mcg by mouth daily before breakfast. )  . lisinopril (ZESTRIL) 5 MG tablet TAKE 1 TABLET BY MOUTH  DAILY (Patient taking differently: Take 5 mg by mouth daily. )  . Magnesium 250 MG TABS Take 250 mg by mouth 2 (two) times a day.   . metFORMIN (GLUCOPHAGE-XR) 500 MG 24 hr tablet TAKE 1 TABLET BY MOUTH 4  TIMES DAILY FOR DIABETES (Patient taking differently: Take 500 mg by mouth 4 (four) times daily -  before meals and at bedtime. )  . Multiple Vitamin (MULTIVITAMIN) capsule Take 1 capsule by  mouth daily.    [name] omeprazole (PRILOSEC) 20 MG capsule Take 1 capsule (20 mg total) by mouth daily.  . vitamin C (ASCORBIC ACID) 500 MG tablet Take 500 mg by mouth daily.     No current facility-administered medications on file prior to visit.    Allergies  Allergen Reactions  . Penicillins Other (See Comments)   Past Medical History:  Diagnosis Date  . Adenomatous colon polyp   . Allergy   . Anal fissure   . Anxiety   . Arthritis   . BPH (benign prostatic hyperplasia)   . Cataract   . Diabetes mellitus ([place])   . GERD (gastroesophageal reflux disease)   . Hyperlipidemia   . Hypertension   . Hypothyroidism   . Other testicular hypofunction   . Stroke ([place])   . Type II or unspecified type diabetes mellitus without mention of complication, not stated as uncontrolled    Health Maintenance  Topic Date Due  . FOOT EXAM  05/11/2019  . INFLUENZA VACCINE  06/22/2019  . HEMOGLOBIN A1C  09/04/2019  . OPHTHALMOLOGY EXAM  10/05/2019  . COLONOSCOPY  05/20/2024  . TETANUS/TDAP  02/08/2025  . PNA vac Low Risk Adult  Completed   Immunization History  Administered Date(s) Administered  . DT 02/09/2015  . Influenza Split 08/07/2013  . Influenza, High Dose Seasonal PF 08/19/2014, 08/28/2015, 09/13/2016, 08/15/2017, 08/15/2018  . Pneumococcal Conjugate-13 06/10/2016  . Pneumococcal Polysaccharide-23 08/13/2008  . Td 11/22/2003   Colon - 09/07/2012 - Dr Deatra Ina - recc 5 yr f/u  Last Colon - 05/21/2019 - Dr Havery Moros - recommended no f/u due to age  Past Surgical History:  Procedure Laterality Date  . COLONOSCOPY    . INGUINAL HERNIA REPAIR Bilateral    1968 and 2004  . LOOP RECORDER INSERTION N/A 07/28/2017   Procedure: LOOP RECORDER INSERTION;  Surgeon: Deboraha Sprang, MD;  Location: [facility];  Service: Cardiovascular;  Laterality: N/A;  . TEE WITHOUT CARDIOVERSION N/A 07/28/2017   Procedure: TRANSESOPHAGEAL ECHOCARDIOGRAM (TEE);  Surgeon: Sueanne Margarita, MD;  [facility]  ENDOSCOPY;  Service: Cardiovascular;  Laterality: N/A;   Family History  Problem Relation Age of Onset  . Sickle cell anemia Mother   . Diabetes Father   . Stomach cancer Father   . Colon cancer Father   . CAD Neg Hx   . Esophageal cancer Neg Hx   . Rectal cancer Neg Hx    Social History   Socioeconomic History  . Marital status: Married    Spouse name: Not on file  . Number of children: 2  . Years of education: Not on file  .  Highest education level: Not on file  Occupational History  . Occupation: Herbalist: RETIRED  Tobacco Use  . Smoking status: Never Smoker  . Smokeless tobacco: Never Used  Substance and Sexual Activity  . Alcohol use: No  . Drug use: No  . Sexual activity: Not on file    ROS Constitutional: Denies fever, chills, weight loss/gain, headaches, insomnia,  night sweats or change in appetite. Does c/o fatigue. Eyes: Denies redness, blurred vision, diplopia, discharge, itchy or watery eyes.  ENT: Denies discharge, congestion, post nasal drip, epistaxis, sore throat, earache, hearing loss, dental pain, Tinnitus, Vertigo, Sinus pain or snoring.  Cardio: Denies chest pain, palpitations, irregular heartbeat, syncope, dyspnea, diaphoresis, orthopnea, PND, claudication or edema Respiratory: denies cough, dyspnea, DOE, pleurisy, hoarseness, laryngitis or wheezing.  Gastrointestinal: Denies dysphagia, heartburn, reflux, water brash, pain, cramps, nausea, vomiting, bloating, diarrhea, constipation, hematemesis, melena, hematochezia, jaundice or hemorrhoids Genitourinary: Denies dysuria, frequency,discharge, hematuria or flank pain. Has urgency, nocturia x 2-3 & occasional hesitancy. Musculoskeletal: Denies arthralgia, myalgia, stiffness, Jt. Swelling, pain, limp or strain/sprain. Denies Falls. Skin: Denies puritis, rash, hives, warts, acne, eczema or change in skin lesion Neuro: No weakness, tremor, incoordination, spasms,  paresthesia or pain Psychiatric: Denies confusion, memory loss or sensory loss. Denies Depression. Endocrine: Denies change in weight, skin, hair change, nocturia, and paresthesia, diabetic polys, visual blurring or hyper / hypo glycemic episodes.  Heme/Lymph: No excessive bleeding, bruising or enlarged lymph nodes.  Physical Exam  BP 132/62   Pulse 92   Temp 97.6 F (36.4 C)   Resp 16   Ht 6' (1.829 m)   Wt 180 lb 6.4 oz (81.8 kg)   BMI 24.47 kg/m   General Appearance: Well nourished and well groomed and in no apparent distress.  Eyes: PERRLA, EOMs, conjunctiva no swelling or erythema, normal fundi and vessels. Sinuses: No frontal/maxillary tenderness ENT/Mouth: EACs patent / TMs  nl. Nares clear without erythema, swelling, mucoid exudates. Oral hygiene is good. No erythema, swelling, or exudate. Tongue normal, non-obstructing. Tonsils not swollen or erythematous. Hearing normal.  Neck: Supple, thyroid not palpable. No bruits, nodes or JVD. Respiratory: Respiratory effort normal.  BS equal and clear bilateral without rales, rhonci, wheezing or stridor. Cardio: Heart sounds are normal with regular rate and rhythm and no murmurs, rubs or gallops. Peripheral pulses are normal and equal bilaterally without edema. No aortic or femoral bruits. Chest: symmetric with normal excursions and percussion.  Abdomen: Soft, with Nl bowel sounds. Nontender, no guarding, rebound, hernias, masses, or organomegaly.  Lymphatics: Non tender without lymphadenopathy.  Musculoskeletal: Full ROM all peripheral extremities, joint stability, 5/5 strength, and normal gait. Skin: Warm and dry without rashes, lesions, cyanosis, clubbing or  ecchymosis.  Neuro: Cranial nerves intact, reflexes equal bilaterally. Normal muscle tone, no cerebellar symptoms. Sensation intact to touch, vibratory and Monofilament to the toes bilaterally. Mild t ataxia. Pysch: Alert and oriented X 3 with normal affect, insight and judgment  appropriate.   Assessment and Plan  1. Annual Preventative/Screening Exam   2. Essential hypertension  - EKG 12-Lead - [place], RETROPERITNL ABD,  LTD - Urinalysis, Routine w reflex microscopic - Microalbumin / creatinine urine ratio - CBC with Differential/Platelet - COMPLETE METABOLIC PANEL WITH GFR - Magnesium - TSH  3. Hyperlipidemia, mixed  - EKG 12-Lead - [place], RETROPERITNL ABD,  LTD - Lipid panel - TSH  4. Type 2 diabetes mellitus with stage 2 chronic kidney disease, without long-term current use of insulin (HCC)  -  EKG 12-Lead - [place], RETROPERITNL ABD,  LTD - Hemoglobin A1c - Insulin, random  5. Vitamin D deficiency  - VITAMIN D 25 Hydroxyl  6. Paroxysmal atrial fibrillation (HCC)  - EKG 12-Lead - [place], RETROPERITNL ABD,  LTD - TSH  7. Hypothyroidism  - TSH  8. Benign prostatic hyperplasia with urinary frequency  - PSA  9. Screening for colorectal cancer  - POC Hemoccult Bld/Stl  10. Prostate cancer screening  - PSA  11. MGUS (monoclonal gammopathy of unknown significance)  - COMPLETE METABOLIC PANEL WITH GFR  12. History of CVA with residual deficit   13. Screening for ischemic heart disease  - EKG 12-Lead  14. Screening for AAA (aortic abdominal aneurysm)  - [place], RETROPERITNL ABD,  LTD  15. Medication management  - Urinalysis, Routine w reflex microscopic - Microalbumin / creatinine urine ratio - CBC with Differential/Platelet - COMPLETE METABOLIC PANEL WITH GFR - Magnesium - Lipid panel - TSH - Hemoglobin A1c - Insulin, random - VITAMIN D 25 Hydroxyl       Patient was counseled in prudent diet, weight control to achieve/maintain BMI less than 25, BP monitoring, regular exercise and medications as discussed.  Discussed med effects and SE's. Routine screening labs and tests as requested with regular follow-up as recommended. Over 40 minutes of exam, counseling, chart review and high complex critical decision making was  performed   Kirtland Bouchard, MD

## 2019-06-05 NOTE — Patient Instructions (Addendum)
YOU NEED TO HAVE A COVID 19 TEST ON 06-07-2019 AT 1110 AM. THIS TEST MUST BE DONE BEFORE SURGERY, COME TO [place] ENTRANCE. ONCE YOUR COVID TEST IS COMPLETED, PLEASE BEGIN THE QUARANTINE INSTRUCTIONS AS OUTLINED IN YOUR HANDOUT.                Earl [name]    Your procedure is scheduled on: 06-11-2019   Report to [facility] Main  Entrance    Report to admitting at 6:25 AM     Call this number if you have problems the morning of surgery [phone]    Remember: Do not eat food or drink liquids :After Midnight.      Take these medicines the morning of surgery with A SIP OF WATER: Levothyroxine (Synthroid), and Omeprazole (Prilosec)   BRUSH YOUR TEETH MORNING OF SURGERY AND RINSE YOUR MOUTH OUT, NO CHEWING GUM CANDY OR MINTS.                 You may not have any metal on your body including hair pins and              piercings     Do not wear jewelry, cologne, lotions, powders or deodorant                      Men may shave face and neck.   Do not bring valuables to the hospital. [place].  Contacts, dentures or bridgework may not be worn into surgery.     Patients discharged the day of surgery will not be allowed to drive home. IF YOU ARE HAVING SURGERY AND GOING HOME THE SAME DAY, YOU MUST HAVE AN ADULT TO DRIVE YOU HOME AND BE WITH YOU FOR 24 HOURS. YOU MAY GO HOME BY TAXI OR UBER OR ORTHERWISE, BUT AN ADULT MUST ACCOMPANY YOU HOME AND STAY WITH YOU FOR 24 HOURS.  Name and phone number of your driver: Earl [name] [phone]  Special Instructions: N/A              Please read over the following fact sheets you were given: _____________________________________________________________________   DO NOT TAKE ANY DIABETIC MEDICATIONS DAY OF YOUR SURGERY            How to Manage Your Diabetes Before and After Surgery  Why is it important to control my blood sugar before and  after surgery? . Improving blood sugar levels before and after surgery helps healing and can limit problems. . A way of improving blood sugar control is eating a healthy diet by: o  Eating less sugar and carbohydrates o  Increasing activity/exercise o  Talking with your doctor about reaching your blood sugar goals . High blood sugars (greater than 180 mg/dL) can raise your risk of infections and slow your recovery, so you will need to focus on controlling your diabetes during the weeks before surgery. . Make sure that the doctor who takes care of your diabetes knows about your planned surgery including the date and location.  How do I manage my blood sugar before surgery? . Check your blood sugar at least 4 times a day, starting 2 days before surgery, to make sure that the level is not too high or low. o Check your blood sugar the morning of your surgery when you wake up and every 2 hours  until you get to the Short Stay unit. . If your blood sugar is less than 70 mg/dL, you will need to treat for low blood sugar: o Do not take insulin. o Treat a low blood sugar (less than 70 mg/dL) with  cup of clear juice (cranberry or apple), 4 glucose tablets, OR glucose gel. o Recheck blood sugar in 15 minutes after treatment (to make sure it is greater than 70 mg/dL). If your blood sugar is not greater than 70 mg/dL on recheck, call [phone] for further instructions. . Report your blood sugar to the short stay nurse when you get to Short Stay.  . If you are admitted to the hospital after surgery: o Your blood sugar will be checked by the staff and you will probably be given insulin after surgery (instead of oral diabetes medicines) to make sure you have good blood sugar levels. o The goal for blood sugar control after surgery is 80-180 mg/dL.   WHAT DO I DO ABOUT MY DIABETES MEDICATION?  [name] Do not take oral diabetes medicines (pills) the morning of surgery.  . THE DAY  BEFORE SURGERY TAKE MORNING  DOSE OF GLIPIZIDE     . THE DAY BEFORE SURGERY TAKE METFORMIN AS USUAL.   .  THE DAY OF SURGERY DO NOT TAKE GLIPIZIDE OR METFORMIN.       [facility] - Preparing for Surgery Before surgery, you can play an important role.  Because skin is not sterile, your skin needs to be as free of germs as possible.  You can reduce the number of germs on your skin by washing with CHG (chlorahexidine gluconate) soap before surgery.  CHG is an antiseptic cleaner which kills germs and bonds with the skin to continue killing germs even after washing. Please DO NOT use if you have an allergy to CHG or antibacterial soaps.  If your skin becomes reddened/irritated stop using the CHG and inform your nurse when you arrive at Short Stay. Do not shave (including legs and underarms) for at least 48 hours prior to the first CHG shower.  You may shave your face/neck. Please follow these instructions carefully:  1.  Shower with CHG Soap the night before surgery and the  morning of Surgery.  2.  If you choose to wash your hair, wash your hair first as usual with your  normal  shampoo.  3.  After you shampoo, rinse your hair and body thoroughly to remove the  shampoo.                           4.  Use CHG as you would any other liquid soap.  You can apply chg directly  to the skin and wash                       Gently with a scrungie or clean washcloth.  5.  Apply the CHG Soap to your body ONLY FROM THE NECK DOWN.   Do not use on face/ open                           Wound or open sores. Avoid contact with eyes, ears mouth and genitals (private parts).                       Wash face,  Genitals (private parts) with your normal soap.  6.  Wash thoroughly, paying special attention to the area where your surgery  will be performed.  7.  Thoroughly rinse your body with warm water from the neck down.  8.  DO NOT shower/wash with your normal soap after using and rinsing off  the CHG Soap.                9.  Pat  yourself dry with a clean towel.            10.  Wear clean pajamas.            11.  Place clean sheets on your bed the night of your first shower and do not  sleep with pets. Day of Surgery : Do not apply any lotions/deodorants the morning of surgery.  Please wear clean clothes to the hospital/surgery center.  FAILURE TO FOLLOW THESE INSTRUCTIONS MAY RESULT IN THE CANCELLATION OF YOUR SURGERY PATIENT SIGNATURE_________________________________  NURSE SIGNATURE__________________________________  ________________________________________________________________________

## 2019-06-05 NOTE — Patient Instructions (Addendum)
- Vit D  And Vit C 1,000 mg  are recommended to help protect  against the Covid_19 and other Corona viruses.   - Also it's recommended to take Zinc 50 mg to help  protect against the Covid_19  And best place to get  is also on [employer] and don't pay more than 6-8 cents /pill !   ================================ Coronavirus (COVID-19) Are you at risk?  Are you at risk for the Coronavirus (COVID-19)?  To be considered HIGH RISK for Coronavirus (COVID-19), you have to meet the following criteria:  . Traveled to [place], [place], [place], [place] or [place]; or in the [place] to [place], [place], [place]  . or [place]; and have fever, cough, and shortness of breath within the last 2 weeks of travel OR . Been in close contact with a person diagnosed with COVID-19 within the last 2 weeks and have  . fever, cough,and shortness of breath .  . IF YOU DO NOT MEET THESE CRITERIA, YOU ARE CONSIDERED LOW RISK FOR COVID-19.  What to do if you are HIGH RISK for COVID-19?  . If you are having a medical emergency, call 911. . Seek medical care right away. Before you go to a doctor's office, urgent care or emergency department, .  call ahead and tell them about your recent travel, contact with someone diagnosed with COVID-19  .  and your symptoms.  . You should receive instructions from your physician's office regarding next steps of care.  . When you arrive at healthcare provider, tell the healthcare staff immediately you have returned from  . visiting [place], [place], [place], [place] or [place]; or traveled in the [place] to [place], [place],  . [place] or [place] in the last two weeks or you have been in close contact with a person diagnosed with  . COVID-19 in the last 2 weeks.   . Tell the health care staff about your symptoms: fever, cough and shortness of breath. . After you have been seen by a medical provider, you will be either: o Tested for (COVID-19) and  discharged home on quarantine except to seek medical care if  o symptoms worsen, and asked to  - Stay home and avoid contact with others until you get your results (4-5 days)  - Avoid travel on public transportation if possible (such as bus, train, or airplane) or o Sent to the Emergency Department by EMS for evaluation, COVID-19 testing  and  o possible admission depending on your condition and test results.  What to do if you are LOW RISK for COVID-19?  Reduce your risk of any infection by using the same precautions used for avoiding the common cold or flu:  . Wash your hands often with soap and warm water for at least 20 seconds.  If soap and water are not readily available,  . use an alcohol-based hand sanitizer with at least 60% alcohol.  . If coughing or sneezing, cover your mouth and nose by coughing or sneezing into the elbow areas of your shirt or coat, .  into a tissue or into your sleeve (not your hands). . Avoid shaking hands with others and consider head nods or verbal greetings only. . Avoid touching your eyes, nose, or mouth with unwashed hands.  . Avoid close contact with people who are sick. . Avoid places or events with large numbers of people in one location, like concerts or sporting events. . Carefully consider travel plans   you have or are making. . If you are planning any travel outside or inside the [place], visit the CDC's Travelers' Health webpage for the latest health notices. . If you have some symptoms but not all symptoms, continue to monitor at home and seek medical attention  . if your symptoms worsen. . If you are having a medical emergency, call 911. >>>>>>>>>>>>>>>>>>>>>>>>>>>>>>>>>>>>>>>>>>>>>>>>>>>>>>> We Do NOT Approve of  [facility] [place] Soliciting Our Patients  To Do Home Visits  & We Do NOT Approve of [employer] > > > > > > > > > > > > > > > > > > > > > > > > > > > > > > > > > > >  > > > >   Preventive Care for Adults  A  healthy lifestyle and preventive care can promote health and wellness. Preventive health guidelines for men include the following key practices:  A routine yearly physical is a good way to check with your health care provider about your health and preventative screening. It is a chance to share any concerns and updates on your health and to receive a thorough exam.  Visit your dentist for a routine exam and preventative care every 6 months. Brush your teeth twice a day and floss once a day. Good oral hygiene prevents tooth decay and gum disease.  The frequency of eye exams is based on your age, health, family medical history, use of contact lenses, and other factors. Follow your health care provider's recommendations for frequency of eye exams.  Eat a healthy diet. Foods such as vegetables, fruits, whole grains, low-fat dairy products, and lean protein foods contain the nutrients you need without too many calories. Decrease your intake of foods high in solid fats, added sugars, and salt. Eat the right amount of calories for you. Get information about a proper diet from your health care provider, if necessary.  Regular physical exercise is one of the most important things you can do for your health. Most adults should get at least 150 minutes of moderate-intensity exercise (any activity that increases your heart rate and causes you to sweat) each week. In addition, most adults need muscle-strengthening exercises on 2 or more days a week.  Maintain a healthy weight. The body mass index (BMI) is a screening tool to identify possible weight problems. It provides an estimate of body fat based on height and weight. Your health care provider can find your BMI and can help you achieve or maintain a healthy weight. For adults 20 years and older:  A BMI below 18.5 is considered underweight.  A BMI of 18.5 to 24.9 is normal.  A BMI of 25 to 29.9 is considered overweight.  A BMI of 30 and above is considered  obese.  Maintain normal blood lipids and cholesterol levels by exercising and minimizing your intake of saturated fat. Eat a balanced diet with plenty of fruit and vegetables. Blood tests for lipids and cholesterol should begin at age 20 and be repeated every 5 years. If your lipid or cholesterol levels are high, you are over 50, or you are at high risk for heart disease, you may need your cholesterol levels checked more frequently. Ongoing high lipid and cholesterol levels should be treated with medicines if diet and exercise are not working.  If you smoke, find out from your health care provider how to quit. If you do not use tobacco, do not start.  Lung cancer screening is recommended for   adults aged 55-80 years who are at high risk for developing lung cancer because of a history of smoking. A yearly low-dose CT scan of the lungs is recommended for people who have at least a 30-pack-year history of smoking and are a current smoker or have quit within the past 15 years. A pack year of smoking is smoking an average of 1 pack of cigarettes a day for 1 year (for example: 1 pack a day for 30 years or 2 packs a day for 15 years). Yearly screening should continue until the smoker has stopped smoking for at least 15 years. Yearly screening should be stopped for people who develop a health problem that would prevent them from having lung cancer treatment.  If you choose to drink alcohol, do not have more than 2 drinks per day. One drink is considered to be 12 ounces (355 mL) of beer, 5 ounces (148 mL) of wine, or 1.5 ounces (44 mL) of liquor.  Avoid use of street drugs. Do not share needles with anyone. Ask for help if you need support or instructions about stopping the use of drugs.  High blood pressure causes heart disease and increases the risk of stroke. Your blood pressure should be checked at least every 1-2 years. Ongoing high blood pressure should be treated with medicines, if weight loss and exercise  are not effective.  If you are 45-79 years old, ask your health care provider if you should take aspirin to prevent heart disease.  Diabetes screening involves taking a blood sample to check your fasting blood sugar level. Testing should be considered at a younger age or be carried out more frequently if you are overweight and have at least 1 risk factor for diabetes.  Colorectal cancer can be detected and often prevented. Most routine colorectal cancer screening begins at the age of 50 and continues through age 75. However, your health care provider may recommend screening at an earlier age if you have risk factors for colon cancer. On a yearly basis, your health care provider may provide home test kits to check for hidden blood in the stool. Use of a small camera at the end of a tube to directly examine the colon (sigmoidoscopy or colonoscopy) can detect the earliest forms of colorectal cancer. Talk to your health care provider about this at age 50, when routine screening begins. Direct exam of the colon should be repeated every 5-10 years through age 75, unless early forms of precancerous polyps or small growths are found.  Hepatitis C blood testing is recommended for all people born from 1945 through 1965 and any individual with known risks for hepatitis C.  Screening for abdominal aortic aneurysm (AAA)  by ultrasound is recommended for people who have history of high blood pressure or who are current or former smokers.  Healthy men should  receive prostate-specific antigen (PSA) blood tests as part of routine cancer screening. Talk with your health care provider about prostate cancer screening.  Testicular cancer screening is  recommended for adult males. Screening includes self-exam, a health care provider exam, and other screening tests. Consult with your health care provider about any symptoms you have or any concerns you have about testicular cancer.  Use sunscreen. Apply sunscreen liberally  and repeatedly throughout the day. You should seek shade when your shadow is shorter than you. Protect yourself by wearing long sleeves, pants, a wide-brimmed hat, and sunglasses year round, whenever you are outdoors.  Once a month, do a whole-body skin   exam, using a mirror to look at the skin on your back. Tell your health care provider about new moles, moles that have irregular borders, moles that are larger than a pencil eraser, or moles that have changed in shape or color.  Stay current with required vaccines (immunizations).  Influenza vaccine. All adults should be immunized every year.  Tetanus, diphtheria, and acellular pertussis (Td, Tdap) vaccine. An adult who has not previously received Tdap or who does not know his vaccine status should receive 1 dose of Tdap. This initial dose should be followed by tetanus and diphtheria toxoids (Td) booster doses every 10 years. Adults with an unknown or incomplete history of completing a 3-dose immunization series with Td-containing vaccines should begin or complete a primary immunization series including a Tdap dose. Adults should receive a Td booster every 10 years.  Zoster vaccine. One dose is recommended for adults aged 60 years or older unless certain conditions are present.    PREVNAR - Pneumococcal 13-valent conjugate (PCV13) vaccine. When indicated, a person who is uncertain of his immunization history and has no record of immunization should receive the PCV13 vaccine. An adult aged 19 years or older who has certain medical conditions and has not been previously immunized should receive 1 dose of PCV13 vaccine. This PCV13 should be followed with a dose of pneumococcal polysaccharide (PPSV23) vaccine. The PPSV23 vaccine dose should be obtained 1 or more year(s)after the dose of PCV13 vaccine. An adult aged 19 years or older who has certain medical conditions and previously received 1 or more doses of PPSV23 vaccine should receive 1 dose of PCV13.  The PCV13 vaccine dose should be obtained 1 or more years after the last PPSV23 vaccine dose.    PNEUMOVAX - Pneumococcal polysaccharide (PPSV23) vaccine. When PCV13 is also indicated, PCV13 should be obtained first. All adults aged 65 years and older should be immunized. An adult younger than age 65 years who has certain medical conditions should be immunized. Any person who resides in a nursing home or long-term care facility should be immunized. An adult smoker should be immunized. People with an immunocompromised condition and certain other conditions should receive both PCV13 and PPSV23 vaccines. People with human immunodeficiency virus (HIV) infection should be immunized as soon as possible after diagnosis. Immunization during chemotherapy or radiation therapy should be avoided. Routine use of PPSV23 vaccine is not recommended for American Indians, [place] Natives, or people younger than 65 years unless there are medical conditions that require PPSV23 vaccine. When indicated, people who have unknown immunization and have no record of immunization should receive PPSV23 vaccine. One-time revaccination 5 years after the first dose of PPSV23 is recommended for people aged 19-64 years who have chronic kidney failure, nephrotic syndrome, asplenia, or immunocompromised conditions. People who received 1-2 doses of PPSV23 before age 65 years should receive another dose of PPSV23 vaccine at age 65 years or later if at least 5 years have passed since the previous dose. Doses of PPSV23 are not needed for people immunized with PPSV23 at or after age 65 years.    Hepatitis A vaccine. Adults who wish to be protected from this disease, have certain high-risk conditions, work with hepatitis A-infected animals, work in hepatitis A research labs, or travel to or work in countries with a high rate of hepatitis A should be immunized. Adults who were previously unvaccinated and who anticipate close contact with an  international adoptee during the first 60 days after arrival in the [place] from   a country with a high rate of hepatitis A should be immunized.    Hepatitis B vaccine. Adults should be immunized if they wish to be protected from this disease, have certain high-risk conditions, may be exposed to blood or other infectious body fluids, are household contacts or sex partners of hepatitis B positive people, are clients or workers in certain care facilities, or travel to or work in countries with a high rate of hepatitis B.   Preventive Service / Frequency   Ages 65 and over  Blood pressure check.  Lipid and cholesterol check.  Lung cancer screening. / Every year if you are aged 55-80 years and have a 30-pack-year history of smoking and currently smoke or have quit within the past 15 years. Yearly screening is stopped once you have quit smoking for at least 15 years or develop a health problem that would prevent you from having lung cancer treatment.  Fecal occult blood test (FOBT) of stool. You may not have to do this test if you get a colonoscopy every 10 years.  Flexible sigmoidoscopy** or colonoscopy.** / Every 5 years for a flexible sigmoidoscopy or every 10 years for a colonoscopy beginning at age 50 and continuing until age 75.  Hepatitis C blood test.** / For all people born from 1945 through 1965 and any individual with known risks for hepatitis C.  Abdominal aortic aneurysm (AAA) screening./ Screening current or former smokers or have Hypertension.  Skin self-exam. / Monthly.  Influenza vaccine. / Every year.  Tetanus, diphtheria, and acellular pertussis (Tdap/Td) vaccine.** / 1 dose of Td every 10 years.   Zoster vaccine.** / 1 dose for adults aged 60 years or older.         Pneumococcal 13-valent conjugate (PCV13) vaccine.    Pneumococcal polysaccharide (PPSV23) vaccine.     Hepatitis A vaccine.** / Consult your health care provider.  Hepatitis B vaccine.** /  Consult your health care provider. Screening for abdominal aortic aneurysm (AAA)  by ultrasound is recommended for people who have history of high blood pressure or who are current or former smokers. ++++++++++ Recommend Adult Low Dose Aspirin or  coated  Aspirin 81 mg daily  To reduce risk of Colon Cancer 40 %,  Skin Cancer 26 % ,  Malignant Melanoma 46%  and  Pancreatic cancer 60% ++++++++++++++++++++++ Vitamin D goal  is between 70-100.  Please make sure that you are taking your Vitamin D as directed.  It is very important as a natural anti-inflammatory  helping hair, skin, and nails, as well as reducing stroke and heart attack risk.  It helps your bones and helps with mood. It also decreases numerous cancer risks so please take it as directed.  Low Vit D is associated with a 200-300% higher risk for CANCER  and 200-300% higher risk for HEART   ATTACK  &  STROKE.   ...................................... It is also associated with higher death rate at younger ages,  autoimmune diseases like Rheumatoid arthritis, Lupus, Multiple Sclerosis.    Also many other serious conditions, like depression, Alzheimer's Dementia, infertility, muscle aches, fatigue, fibromyalgia - just to name a few. ++++++++++++++++++++++ Recommend the book "The END of DIETING" by Dr Joel Fuhrman  & the book "The END of DIABETES " by Dr Joel Fuhrman At [employer].com - get book & Audio CD's    Being diabetic has a  300% increased risk for heart attack, stroke, cancer, and alzheimer- type vascular dementia. It is very important that you work harder   with diet by avoiding all foods that are white. Avoid white rice (brown & wild rice is OK), white potatoes (sweetpotatoes in moderation is OK), White bread or wheat bread or anything made out of white flour like bagels, donuts, rolls, buns, biscuits, cakes, pastries, cookies, pizza crust, and pasta (made from white flour & egg whites) - vegetarian pasta or spinach or wheat  pasta is OK. Multigrain breads like Arnold's or Pepper[employer], or multigrain sandwich thins or flatbreads.  Diet, exercise and weight loss can reverse and cure diabetes in the early stages.  Diet, exercise and weight loss is very important in the control and prevention of complications of diabetes which affects every system in your body, ie. Brain - dementia/stroke, eyes - glaucoma/blindness, heart - heart attack/heart failure, kidneys - dialysis, stomach - gastric paralysis, intestines - malabsorption, nerves - severe painful neuritis, circulation - gangrene & loss of a leg(s), and finally cancer and Alzheimers.    I recommend avoid fried & greasy foods,  sweets/candy, white rice (brown or wild rice or Quinoa is OK), white potatoes (sweet potatoes are OK) - anything made from white flour - bagels, doughnuts, rolls, buns, biscuits,white and wheat breads, pizza crust and traditional pasta made of white flour & egg white(vegetarian pasta or spinach or wheat pasta is OK).  Multi-grain bread is OK - like multi-grain flat bread or sandwich thins. Avoid alcohol in excess. Exercise is also important.    Eat all the vegetables you want - avoid meat, especially red meat and dairy - especially cheese.  Cheese is the most concentrated form of trans-fats which is the worst thing to clog up our arteries. Veggie cheese is OK which can be found in the fresh produce section at [employer] or [employer] or [employer]  ++++++++++++++++++++++ DASH Eating Plan  DASH stands for "Dietary Approaches to Stop Hypertension."   The DASH eating plan is a healthy eating plan that has been shown to reduce high blood pressure (hypertension). Additional health benefits may include reducing the risk of type 2 diabetes mellitus, heart disease, and stroke. The DASH eating plan may also help with weight loss. WHAT DO I NEED TO KNOW ABOUT THE DASH EATING PLAN? For the DASH eating plan, you will follow these general  guidelines:  Choose foods with a percent daily value for sodium of less than 5% (as listed on the food label).  Use salt-free seasonings or herbs instead of table salt or sea salt.  Check with your health care provider or pharmacist before using salt substitutes.  Eat lower-sodium products, often labeled as "lower sodium" or "no salt added."  Eat fresh foods.  Eat more vegetables, fruits, and low-fat dairy products.  Choose whole grains. Look for the word "whole" as the first word in the ingredient list.  Choose fish   Limit sweets, desserts, sugars, and sugary drinks.  Choose heart-healthy fats.  Eat veggie cheese   Eat more home-cooked food and less restaurant, buffet, and fast food.  Limit fried foods.  Cook foods using methods other than frying.  Limit canned vegetables. If you do use them, rinse them well to decrease the sodium.  When eating at a restaurant, ask that your food be prepared with less salt, or no salt if possible.                      WHAT FOODS CAN I EAT? Read Dr Joel Fuhrman's books on The End of Dieting & The End of Diabetes    Grains Whole grain or whole wheat bread. Brown rice. Whole grain or whole wheat pasta. Quinoa, bulgur, and whole grain cereals. Low-sodium cereals. Corn or whole wheat flour tortillas. Whole grain cornbread. Whole grain crackers. Low-sodium crackers.  Vegetables Fresh or frozen vegetables (raw, steamed, roasted, or grilled). Low-sodium or reduced-sodium tomato and vegetable juices. Low-sodium or reduced-sodium tomato sauce and paste. Low-sodium or reduced-sodium canned vegetables.   Fruits All fresh, canned (in natural juice), or frozen fruits.  Protein Products  All fish and seafood.  Dried beans, peas, or lentils. Unsalted nuts and seeds. Unsalted canned beans.  Dairy Low-fat dairy products, such as skim or 1% milk, 2% or reduced-fat cheeses, low-fat ricotta or cottage cheese, or plain low-fat yogurt. Low-sodium or  reduced-sodium cheeses.  Fats and Oils Tub margarines without trans fats. Light or reduced-fat mayonnaise and salad dressings (reduced sodium). Avocado. Safflower, olive, or canola oils. Natural peanut or almond butter.  Other Unsalted popcorn and pretzels. The items listed above may not be a complete list of recommended foods or beverages. Contact your dietitian for more options.  ++++++++++++++++++++  WHAT FOODS ARE NOT RECOMMENDED? Grains/ White flour or wheat flour White bread. White pasta. White rice. Refined cornbread. Bagels and croissants. Crackers that contain trans fat.  Vegetables  Creamed or fried vegetables. Vegetables in a . Regular canned vegetables. Regular canned tomato sauce and paste. Regular tomato and vegetable juices.  Fruits Dried fruits. Canned fruit in light or heavy syrup. Fruit juice.  Meat and Other Protein Products Meat in general - RED meat & White meat.  Fatty cuts of meat. Ribs, chicken wings, all processed meats as bacon, sausage, bologna, salami, fatback, hot dogs, bratwurst and packaged luncheon meats.  Dairy Whole or 2% milk, cream, half-and-half, and cream cheese. Whole-fat or sweetened yogurt. Full-fat cheeses or blue cheese. Non-dairy creamers and whipped toppings. Processed cheese, cheese spreads, or cheese curds.  Condiments Onion and garlic salt, seasoned salt, table salt, and sea salt. Canned and packaged gravies. Worcestershire sauce. Tartar sauce. Barbecue sauce. Teriyaki sauce. Soy sauce, including reduced sodium. Steak sauce. Fish sauce. Oyster sauce. Cocktail sauce. Horseradish. Ketchup and mustard. Meat flavorings and tenderizers. Bouillon cubes. Hot sauce. Tabasco sauce. Marinades. Taco seasonings. Relishes.  Fats and Oils Butter, stick margarine, lard, shortening and bacon fat. Coconut, palm kernel, or palm oils. Regular salad dressings.  Pickles and olives. Salted popcorn and pretzels.  The items listed above may not be a  complete list of foods and beverages to avoid.    

## 2019-06-06 ENCOUNTER — Other Ambulatory Visit: Payer: Self-pay

## 2019-06-06 ENCOUNTER — Ambulatory Visit (INDEPENDENT_AMBULATORY_CARE_PROVIDER_SITE_OTHER): Payer: Medicare Other | Admitting: Internal Medicine

## 2019-06-06 VITALS — BP 132/62 | HR 92 | Temp 97.6°F | Resp 16 | Ht 72.0 in | Wt 180.4 lb

## 2019-06-06 DIAGNOSIS — Z0000 Encounter for general adult medical examination without abnormal findings: Secondary | ICD-10-CM | POA: Diagnosis not present

## 2019-06-06 DIAGNOSIS — E782 Mixed hyperlipidemia: Secondary | ICD-10-CM

## 2019-06-06 DIAGNOSIS — E559 Vitamin D deficiency, unspecified: Secondary | ICD-10-CM

## 2019-06-06 DIAGNOSIS — Z136 Encounter for screening for cardiovascular disorders: Secondary | ICD-10-CM | POA: Diagnosis not present

## 2019-06-06 DIAGNOSIS — I6930 Unspecified sequelae of cerebral infarction: Secondary | ICD-10-CM

## 2019-06-06 DIAGNOSIS — Z79899 Other long term (current) drug therapy: Secondary | ICD-10-CM

## 2019-06-06 DIAGNOSIS — Z0001 Encounter for general adult medical examination with abnormal findings: Secondary | ICD-10-CM

## 2019-06-06 DIAGNOSIS — I10 Essential (primary) hypertension: Secondary | ICD-10-CM

## 2019-06-06 DIAGNOSIS — I480 Paroxysmal atrial fibrillation: Secondary | ICD-10-CM

## 2019-06-06 DIAGNOSIS — E1122 Type 2 diabetes mellitus with diabetic chronic kidney disease: Secondary | ICD-10-CM

## 2019-06-06 DIAGNOSIS — Z125 Encounter for screening for malignant neoplasm of prostate: Secondary | ICD-10-CM

## 2019-06-06 DIAGNOSIS — E039 Hypothyroidism, unspecified: Secondary | ICD-10-CM

## 2019-06-06 DIAGNOSIS — N401 Enlarged prostate with lower urinary tract symptoms: Secondary | ICD-10-CM

## 2019-06-06 DIAGNOSIS — D472 Monoclonal gammopathy: Secondary | ICD-10-CM

## 2019-06-06 DIAGNOSIS — Z1211 Encounter for screening for malignant neoplasm of colon: Secondary | ICD-10-CM

## 2019-06-06 MED ORDER — ALPRAZOLAM 1 MG PO TABS: ORAL_TABLET | ORAL | 0 refills | Status: DC

## 2019-06-06 NOTE — Progress Notes (Signed)
PER CONNIE MABE PT TO STOP ELIQUIS 3 DAYS PRIOR TO SURGERY PER DR ESKRIDGE.

## 2019-06-07 ENCOUNTER — Encounter (HOSPITAL_COMMUNITY)
Admission: RE | Admit: 2019-06-07 | Discharge: 2019-06-07 | Disposition: A | Discharge status: Home / Self Care | Payer: Medicare Other | Source: Ambulatory Visit | Attending: Urology | Admitting: Urology

## 2019-06-07 ENCOUNTER — Encounter (HOSPITAL_COMMUNITY): Payer: Self-pay

## 2019-06-07 ENCOUNTER — Other Ambulatory Visit (HOSPITAL_COMMUNITY)
Admission: RE | Admit: 2019-06-07 | Discharge: 2019-06-07 | Disposition: A | Discharge status: Home / Self Care | Payer: Medicare Other | Source: Ambulatory Visit | Attending: Urology | Admitting: Urology

## 2019-06-07 ENCOUNTER — Ambulatory Visit (INDEPENDENT_AMBULATORY_CARE_PROVIDER_SITE_OTHER): Payer: Medicare Other | Admitting: *Deleted

## 2019-06-07 ENCOUNTER — Other Ambulatory Visit: Payer: Self-pay

## 2019-06-07 DIAGNOSIS — N471 Phimosis: Secondary | ICD-10-CM | POA: Diagnosis not present

## 2019-06-07 DIAGNOSIS — I129 Hypertensive chronic kidney disease with stage 1 through stage 4 chronic kidney disease, or unspecified chronic kidney disease: Secondary | ICD-10-CM | POA: Diagnosis not present

## 2019-06-07 DIAGNOSIS — Z01812 Encounter for preprocedural laboratory examination: Secondary | ICD-10-CM | POA: Diagnosis not present

## 2019-06-07 DIAGNOSIS — N182 Chronic kidney disease, stage 2 (mild): Secondary | ICD-10-CM | POA: Insufficient documentation

## 2019-06-07 DIAGNOSIS — Z1159 Encounter for screening for other viral diseases: Secondary | ICD-10-CM | POA: Insufficient documentation

## 2019-06-07 DIAGNOSIS — I639 Cerebral infarction, unspecified: Secondary | ICD-10-CM | POA: Diagnosis not present

## 2019-06-07 DIAGNOSIS — E1122 Type 2 diabetes mellitus with diabetic chronic kidney disease: Secondary | ICD-10-CM | POA: Insufficient documentation

## 2019-06-07 LAB — CBC WITH DIFFERENTIAL/PLATELET
Absolute Monocytes: 408 cells/uL (ref 200–950)
Basophils Absolute: 41 cells/uL (ref 0–200)
Basophils Relative: 0.8 %
Eosinophils Absolute: 377 cells/uL (ref 15–500)
Eosinophils Relative: 7.4 %
HCT: 37 % — ABNORMAL LOW (ref 38.5–50.0)
Hemoglobin: 12.7 g/dL — ABNORMAL LOW (ref 13.2–17.1)
Lymphs Abs: 1785 cells/uL (ref 850–3900)
MCH: 29.1 pg (ref 27.0–33.0)
MCHC: 34.3 g/dL (ref 32.0–36.0)
MCV: 84.7 fL (ref 80.0–100.0)
MPV: 10.2 fL (ref 7.5–12.5)
Monocytes Relative: 8 %
Neutro Abs: 2489 cells/uL (ref 1500–7800)
Neutrophils Relative %: 48.8 %
Platelets: 193 10*3/uL (ref 140–400)
RBC: 4.37 10*6/uL (ref 4.20–5.80)
RDW: 12.7 % (ref 11.0–15.0)
Total Lymphocyte: 35 %
WBC: 5.1 10*3/uL (ref 3.8–10.8)

## 2019-06-07 LAB — GLUCOSE, CAPILLARY: Glucose-Capillary: 200 mg/dL — ABNORMAL HIGH (ref 70–99)

## 2019-06-07 LAB — INSULIN, RANDOM: Insulin: 23.5 u[IU]/mL — ABNORMAL HIGH

## 2019-06-07 LAB — LIPID PANEL
Cholesterol: 108 mg/dL (ref <200)
HDL: 45 mg/dL (ref >=40)
LDL Cholesterol (Calc): 49 mg/dL (calc)
Non-HDL Cholesterol (Calc): 63 mg/dL (calc) (ref <130)
Total CHOL/HDL Ratio: 2.4 (calc) (ref <5.0)
Triglycerides: 49 mg/dL (ref <150)

## 2019-06-07 LAB — COMPLETE METABOLIC PANEL WITH GFR
AG Ratio: 1.9 (calc) (ref 1.0–2.5)
ALT: 25 U/L (ref 9–46)
AST: 21 U/L (ref 10–35)
Albumin: 3.7 g/dL (ref 3.6–5.1)
Alkaline phosphatase (APISO): 59 U/L (ref 35–144)
BUN: 22 mg/dL (ref 7–25)
CO2: 23 mmol/L (ref 20–32)
Calcium: 9.2 mg/dL (ref 8.6–10.3)
Chloride: 105 mmol/L (ref 98–110)
Creat: 0.97 mg/dL (ref 0.70–1.18)
GFR, Est African American: 86 mL/min/{1.73_m2} (ref >=60)
GFR, Est Non African American: 74 mL/min/{1.73_m2} (ref >=60)
Globulin: 1.9 g/dL (calc) (ref 1.9–3.7)
Glucose, Bld: 162 mg/dL — ABNORMAL HIGH (ref 65–99)
Potassium: 4.1 mmol/L (ref 3.5–5.3)
Sodium: 140 mmol/L (ref 135–146)
Total Bilirubin: 0.6 mg/dL (ref 0.2–1.2)
Total Protein: 5.6 g/dL — ABNORMAL LOW (ref 6.1–8.1)

## 2019-06-07 LAB — URINALYSIS, ROUTINE W REFLEX MICROSCOPIC
Bacteria, UA: NONE SEEN /HPF
Bilirubin Urine: NEGATIVE
Glucose, UA: NEGATIVE
Hgb urine dipstick: NEGATIVE
Hyaline Cast: NONE SEEN /LPF
Ketones, ur: NEGATIVE
Nitrite: NEGATIVE
Protein, ur: NEGATIVE
RBC / HPF: NONE SEEN /HPF (ref 0–2)
Specific Gravity, Urine: 1.013 (ref 1.001–1.03)
Squamous Epithelial / HPF: NONE SEEN /HPF (ref <=5)
WBC, UA: NONE SEEN /HPF (ref 0–5)
pH: 5.5 (ref 5.0–8.0)

## 2019-06-07 LAB — HEMOGLOBIN A1C
Hgb A1c MFr Bld: 6.7 % of total Hgb — ABNORMAL HIGH (ref <5.7)
Mean Plasma Glucose: 146 (calc)
eAG (mmol/L): 8.1 (calc)

## 2019-06-07 LAB — MICROALBUMIN / CREATININE URINE RATIO
Creatinine, Urine: 55 mg/dL (ref 20–320)
Microalb Creat Ratio: 29 mcg/mg creat (ref <30)
Microalb, Ur: 1.6 mg/dL

## 2019-06-07 LAB — CUP PACEART REMOTE DEVICE CHECK
Date Time Interrogation Session: 20200717161021
Implantable Pulse Generator Implant Date: 20180907

## 2019-06-07 LAB — MAGNESIUM: Magnesium: 1.8 mg/dL (ref 1.5–2.5)

## 2019-06-07 LAB — VITAMIN D 25 HYDROXY (VIT D DEFICIENCY, FRACTURES): Vit D, 25-Hydroxy: 64 ng/mL (ref 30–100)

## 2019-06-07 LAB — TSH: TSH: 0.66 mIU/L (ref 0.40–4.50)

## 2019-06-07 LAB — SARS CORONAVIRUS 2 (TAT 6-24 HRS): SARS Coronavirus 2: NEGATIVE

## 2019-06-07 LAB — PSA: PSA: 0.4 ng/mL (ref <=4.0)

## 2019-06-07 NOTE — Progress Notes (Addendum)
06-06-19 (Epic)   CMP, CBC.  and HGA1C  06-07-19 Also spoke to [facility] as patient indicated that he was to hold  Eliquis 2 days prior to surgery in lieu of 3 days as noted in progress note by Zelphia Cairo, RN   Per Beola Cord, Dr. Olin Pia clearance note for patient,  indicates that pt is to hold Eliquis 2 days prior to surgery.  [facility] office customarily ask if Eliquis can be held 3 days prior to surgery. However Dr. Caryl Comes specifically indicated 2 days prior... Patient verbalized understanding to hold 2 days prior.

## 2019-06-08 ENCOUNTER — Encounter: Payer: Self-pay | Admitting: Internal Medicine

## 2019-06-10 ENCOUNTER — Encounter (HOSPITAL_COMMUNITY): Payer: Self-pay | Admitting: *Deleted

## 2019-06-10 NOTE — Progress Notes (Signed)
Anesthesia Chart Review   Case: [id number] Date/Time: 06/11/19 0815   Procedure: CIRCUMCISION ADULT (N/A )   Anesthesia type: Choice   Pre-op diagnosis: PHIMOSIS   Location: [facility] ROOM 08 / [facility]   Surgeon: [name], MD      DISCUSSION:78 y.o. 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 [name].   Last seen by PCP, Dr. [name], 06/06/2019.  Stable at this visit.   Cardiac clearance received 05/13/2019.  Per Daune Perch, NP, "Patient's son [name] was contacted 05/13/2019 in reference to pre-operative risk assessment for pending surgery as outlined below.  Yonatan Guitron was last seen on 12/21/2018 by Dr. Caryl Comes.  Since that day, Montoya Brandel has no had any new complaints. No chest pain, shortness of breath, palpitations or syncope. Therefore, based on ACC/AHA guidelines, the patient would be at acceptable risk for the planned procedure without further cardiovascular testing.  Because of the patient's history of stroke, Dr. Caryl Comes has recommended to limit the time that anticoagulation is held. He recommended to only hold prior evening dose and am dose for upcoming circumcision."   Anticipate pt can proceed with planned procedure barring acute status change.   VS: BP 137/78   Pulse 93   Temp 36.9 C (Oral)   Resp 16   Ht 5\' 11"  (1.803 m)   Wt 81.1 kg   SpO2 100%   BMI 24.92 kg/m   PROVIDERS: [name], MD is PCP   [name], MD is Cardiologist  LABS: Labs reviewed: Acceptable for surgery. (all labs ordered are listed, but only abnormal results are displayed)  Labs Reviewed  GLUCOSE, CAPILLARY - Abnormal; Notable for the following components:      Result Value   Glucose-Capillary 200 (*)    All other components within normal limits     IMAGES:   EKG: 12/21/2018 Rate 87  bpm Normal sinus rhythm  Left axis deviation   CV: Echo TEE 07/28/17 Study Conclusions  - Left ventricle: Wall thickness was increased in a pattern of   moderate LVH. Systolic function was normal. The estimated   ejection fraction was in the range of 55% to 60%. Wall motion was   normal; there were no regional wall motion abnormalities. - Aortic valve: There was mild regurgitation. - Mitral valve: There was mild regurgitation. - Left atrium: No evidence of thrombus in the atrial cavity or   appendage. - Right atrium: No evidence of thrombus in the atrial cavity or   appendage. - Atrial septum: The interatrial septum was hypermobile. - Tricuspid valve: There was mild regurgitation. - Pulmonic valve: There was mild regurgitation, with multiple jets. - Impressions: No cardiac source of emboli was indentified.  Impressions:  - No cardiac source of emboli was indentified. Past Medical History:  Diagnosis Date  . Adenomatous colon polyp   . Allergy   . Anal fissure   . Anxiety   . Arthritis   . BPH (benign prostatic hyperplasia)   . Cataract   . Diabetes mellitus ([place])   . GERD (gastroesophageal reflux disease)   . Hyperlipidemia   . Hypertension   . Hypothyroidism   . Other testicular hypofunction   . Stroke ([place])   . Type II or unspecified type diabetes mellitus without mention of complication, not stated as uncontrolled     Past Surgical History:  Procedure Laterality  Date  . COLONOSCOPY    . INGUINAL HERNIA REPAIR Bilateral    1968 and 2004  . LOOP RECORDER INSERTION N/A 07/28/2017   Procedure: LOOP RECORDER INSERTION;  Surgeon: Deboraha Sprang, MD;  Location: [place] [facility];  Service: Cardiovascular;  Laterality: N/A;  . TEE WITHOUT CARDIOVERSION N/A 07/28/2017   Procedure: TRANSESOPHAGEAL ECHOCARDIOGRAM (TEE);  Surgeon: Sueanne Margarita, MD;  Location: [facility] ENDOSCOPY;  Service: Cardiovascular;  Laterality: N/A;    MEDICATIONS: . ALPRAZolam (XANAX) 1 MG tablet   . atorvastatin (LIPITOR) 40 MG tablet  . Cholecalciferol (VITAMIN D PO)  . Cyanocobalamin (B-12) 50 MCG TABS  . ELIQUIS 5 MG TABS tablet  . finasteride (PROSCAR) 5 MG tablet  . glipiZIDE (GLUCOTROL) 5 MG tablet  . hydrocortisone (ANUSOL-HC) 2.5 % rectal cream  . levothyroxine (SYNTHROID) 50 MCG tablet  . lisinopril (ZESTRIL) 5 MG tablet  . Magnesium 250 MG TABS  . metFORMIN (GLUCOPHAGE-XR) 500 MG 24 hr tablet  . Multiple Vitamin (MULTIVITAMIN) capsule  . omeprazole (PRILOSEC) 20 MG capsule  . vitamin C (ASCORBIC ACID) 500 MG tablet   No current facility-administered medications for this encounter.       Maia Plan [facility]-Surgical Testing [phone] 06/10/19  1:16 PM

## 2019-06-10 NOTE — Anesthesia Preprocedure Evaluation (Addendum)
Anesthesia Evaluation  Patient identified by MRN, date of birth, ID band Patient awake    Reviewed: Allergy & Precautions, NPO status , Patient's Chart, lab work & pertinent test results  Airway Mallampati: III  TM Distance: >3 FB Neck ROM: Full Positive for:  Tracheal deviation  Comment: Large goiter primarily left sided with slight right tracheal deviation. Dental  (+) Partial Upper   Pulmonary    Pulmonary exam normal breath sounds clear to auscultation       Cardiovascular hypertension, Pt. on medications Normal cardiovascular exam Rhythm:Regular Rate:Normal  EKG 12/21/2018 NSR w/ LAD  Echo 07/28/2017 Left ventricle: Wall thickness was increased in a pattern of   moderate LVH. Systolic function was normal. The estimated ejection fraction was in the range of 55% to 60%. Wall motion was normal; there were no regional wall motion abnormalities. - Aortic valve: There was mild regurgitation. - Mitral valve: There was mild regurgitation. - Left atrium: No evidence of thrombus in the atrial cavity or   appendage. - Right atrium: No evidence of thrombus in the atrial cavity or appendage. - Atrial septum: The interatrial septum was hypermobile. - Tricuspid valve: There was mild regurgitation. - Pulmonic valve: There was mild regurgitation, with multiple jets. - Impressions: No cardiac source of emboli was indentified.   Neuro/Psych Anxiety Ataxia  Neuromuscular disease CVA, Residual Symptoms    GI/Hepatic Neg liver ROS, GERD  ,  Endo/Other  diabetes, Well Controlled, Type 2, Oral Hypoglycemic AgentsHypothyroidism Hyperlipidemia  Renal/GU Renal InsufficiencyRenal disease  negative genitourinary   Musculoskeletal  (+) Arthritis , Osteoarthritis,    Abdominal   Peds  Hematology  (+) anemia , Eliquis therapy- last dose Monoclonal gammopathy of unknown significance   Anesthesia Other Findings    Reproductive/Obstetrics Phimosis                          Anesthesia Physical Anesthesia Plan  ASA: III  Anesthesia Plan: General   Post-op Pain Management:    Induction: Intravenous  PONV Risk Score and Plan: 4 or greater and Ondansetron, Treatment may vary due to age or medical condition and Dexamethasone  Airway Management Planned: LMA  Additional Equipment:   Intra-op Plan:   Post-operative Plan: Extubation in OR  Informed Consent: I have reviewed the patients History and Physical, chart, labs and discussed the procedure including the risks, benefits and alternatives for the proposed anesthesia with the patient or authorized representative who has indicated his/her understanding and acceptance.     Dental advisory given  Plan Discussed with: CRNA and Surgeon  Anesthesia Plan Comments: (See PAT note 06/07/2019, Konrad Felix, PA-C)      Anesthesia Quick Evaluation

## 2019-06-11 ENCOUNTER — Ambulatory Visit (HOSPITAL_COMMUNITY)
Admission: RE | Admit: 2019-06-11 | Discharge: 2019-06-11 | Disposition: A | Discharge status: Home / Self Care | Payer: Medicare Other | Attending: Urology | Admitting: Urology

## 2019-06-11 ENCOUNTER — Encounter (HOSPITAL_COMMUNITY)
Admission: RE | Disposition: A | Discharge status: Home / Self Care | Payer: Self-pay | Source: Home / Self Care | Attending: Urology

## 2019-06-11 ENCOUNTER — Ambulatory Visit (HOSPITAL_COMMUNITY): Payer: Medicare Other | Admitting: Physician Assistant

## 2019-06-11 ENCOUNTER — Ambulatory Visit (HOSPITAL_COMMUNITY): Payer: Medicare Other | Admitting: Anesthesiology

## 2019-06-11 ENCOUNTER — Encounter (HOSPITAL_COMMUNITY): Payer: Self-pay

## 2019-06-11 DIAGNOSIS — Z86010 Personal history of colonic polyps: Secondary | ICD-10-CM | POA: Diagnosis not present

## 2019-06-11 DIAGNOSIS — Z7984 Long term (current) use of oral hypoglycemic drugs: Secondary | ICD-10-CM | POA: Diagnosis not present

## 2019-06-11 DIAGNOSIS — E039 Hypothyroidism, unspecified: Secondary | ICD-10-CM | POA: Insufficient documentation

## 2019-06-11 DIAGNOSIS — Z79890 Hormone replacement therapy (postmenopausal): Secondary | ICD-10-CM | POA: Insufficient documentation

## 2019-06-11 DIAGNOSIS — Z8673 Personal history of transient ischemic attack (TIA), and cerebral infarction without residual deficits: Secondary | ICD-10-CM | POA: Diagnosis not present

## 2019-06-11 DIAGNOSIS — Z7901 Long term (current) use of anticoagulants: Secondary | ICD-10-CM | POA: Diagnosis not present

## 2019-06-11 DIAGNOSIS — Z880 Allergy status to penicillin: Secondary | ICD-10-CM | POA: Insufficient documentation

## 2019-06-11 DIAGNOSIS — E785 Hyperlipidemia, unspecified: Secondary | ICD-10-CM | POA: Insufficient documentation

## 2019-06-11 DIAGNOSIS — N471 Phimosis: Secondary | ICD-10-CM | POA: Diagnosis not present

## 2019-06-11 DIAGNOSIS — Z79899 Other long term (current) drug therapy: Secondary | ICD-10-CM | POA: Diagnosis not present

## 2019-06-11 DIAGNOSIS — E119 Type 2 diabetes mellitus without complications: Secondary | ICD-10-CM | POA: Diagnosis not present

## 2019-06-11 DIAGNOSIS — F419 Anxiety disorder, unspecified: Secondary | ICD-10-CM | POA: Diagnosis not present

## 2019-06-11 DIAGNOSIS — N400 Enlarged prostate without lower urinary tract symptoms: Secondary | ICD-10-CM | POA: Diagnosis not present

## 2019-06-11 DIAGNOSIS — K219 Gastro-esophageal reflux disease without esophagitis: Secondary | ICD-10-CM | POA: Diagnosis not present

## 2019-06-11 DIAGNOSIS — M1990 Unspecified osteoarthritis, unspecified site: Secondary | ICD-10-CM | POA: Insufficient documentation

## 2019-06-11 DIAGNOSIS — I10 Essential (primary) hypertension: Secondary | ICD-10-CM | POA: Insufficient documentation

## 2019-06-11 HISTORY — PX: CIRCUMCISION: SHX1350

## 2019-06-11 LAB — GLUCOSE, CAPILLARY
Glucose-Capillary: 120 mg/dL — ABNORMAL HIGH (ref 70–99)
Glucose-Capillary: 152 mg/dL — ABNORMAL HIGH (ref 70–99)

## 2019-06-11 SURGERY — CIRCUMCISION, ADULT
Anesthesia: General | Site: Penis

## 2019-06-11 MED ORDER — ONDANSETRON HCL 4 MG/2ML IJ SOLN
INTRAMUSCULAR | Status: AC
  Filled 2019-06-11: qty 2

## 2019-06-11 MED ORDER — PROPOFOL 10 MG/ML IV BOLUS
INTRAVENOUS | Status: DC | PRN
  Administered 2019-06-11: 200 mg via INTRAVENOUS

## 2019-06-11 MED ORDER — OXYCODONE HCL 5 MG PO TABS
5.0000 mg | ORAL_TABLET | Freq: Once | ORAL | Status: AC | PRN
  Administered 2019-06-11: 5 mg via ORAL

## 2019-06-11 MED ORDER — CEFAZOLIN SODIUM-DEXTROSE 2-4 GM/100ML-% IV SOLN
2.0000 g | Freq: Once | INTRAVENOUS | Status: AC
  Administered 2019-06-11: 2 g via INTRAVENOUS

## 2019-06-11 MED ORDER — BUPIVACAINE HCL (PF) 0.25 % IJ SOLN
INTRAMUSCULAR | Status: AC
  Filled 2019-06-11: qty 30

## 2019-06-11 MED ORDER — CEFAZOLIN SODIUM-DEXTROSE 2-4 GM/100ML-% IV SOLN
INTRAVENOUS | Status: AC
  Filled 2019-06-11: qty 100

## 2019-06-11 MED ORDER — BUPIVACAINE HCL (PF) 0.25 % IJ SOLN
INTRAMUSCULAR | Status: DC | PRN
  Administered 2019-06-11: 10 mL

## 2019-06-11 MED ORDER — HYDROMORPHONE HCL 1 MG/ML IJ SOLN
INTRAMUSCULAR | Status: AC
  Filled 2019-06-11: qty 1

## 2019-06-11 MED ORDER — SODIUM CHLORIDE 0.9 % IR SOLN
Status: DC | PRN
  Administered 2019-06-11: 1000 mL

## 2019-06-11 MED ORDER — PROPOFOL 10 MG/ML IV BOLUS
INTRAVENOUS | Status: AC
  Filled 2019-06-11: qty 20

## 2019-06-11 MED ORDER — LACTATED RINGERS IV SOLN
INTRAVENOUS | Status: DC
  Administered 2019-06-11: 07:00:00 via INTRAVENOUS

## 2019-06-11 MED ORDER — OXYCODONE HCL 5 MG PO TABS
ORAL_TABLET | ORAL | Status: AC
  Filled 2019-06-11: qty 1

## 2019-06-11 MED ORDER — LIDOCAINE 2% (20 MG/ML) 5 ML SYRINGE
INTRAMUSCULAR | Status: DC | PRN
  Administered 2019-06-11: 100 mg via INTRAVENOUS

## 2019-06-11 MED ORDER — DEXAMETHASONE SODIUM PHOSPHATE 10 MG/ML IJ SOLN
INTRAMUSCULAR | Status: DC | PRN
  Administered 2019-06-11: 8 mg via INTRAVENOUS

## 2019-06-11 MED ORDER — FENTANYL CITRATE (PF) 100 MCG/2ML IJ SOLN
INTRAMUSCULAR | Status: AC
  Filled 2019-06-11: qty 2

## 2019-06-11 MED ORDER — ONDANSETRON HCL 4 MG/2ML IJ SOLN
INTRAMUSCULAR | Status: DC | PRN
  Administered 2019-06-11: 4 mg via INTRAVENOUS

## 2019-06-11 MED ORDER — MEPERIDINE HCL 50 MG/ML IJ SOLN: 6.2500 mg | INTRAMUSCULAR | Status: DC | PRN

## 2019-06-11 MED ORDER — FENTANYL CITRATE (PF) 100 MCG/2ML IJ SOLN
INTRAMUSCULAR | Status: DC | PRN
  Administered 2019-06-11: 50 ug via INTRAVENOUS
  Administered 2019-06-11 (×2): 25 ug via INTRAVENOUS

## 2019-06-11 MED ORDER — OXYCODONE HCL 5 MG/5ML PO SOLN: 5.0000 mg | Freq: Once | ORAL | Status: AC | PRN

## 2019-06-11 MED ORDER — HYDROMORPHONE HCL 1 MG/ML IJ SOLN
0.2500 mg | INTRAMUSCULAR | Status: DC | PRN
  Administered 2019-06-11 (×2): 0.5 mg via INTRAVENOUS

## 2019-06-11 MED ORDER — DEXAMETHASONE SODIUM PHOSPHATE 10 MG/ML IJ SOLN
INTRAMUSCULAR | Status: AC
  Filled 2019-06-11: qty 1

## 2019-06-11 MED ORDER — ONDANSETRON HCL 4 MG/2ML IJ SOLN: 4.0000 mg | Freq: Once | INTRAMUSCULAR | Status: DC | PRN

## 2019-06-11 MED ORDER — LIDOCAINE 2% (20 MG/ML) 5 ML SYRINGE
INTRAMUSCULAR | Status: AC
  Filled 2019-06-11: qty 5

## 2019-06-11 SURGICAL SUPPLY — 25 items
BLADE SURG 15 STRL LF DISP TIS (BLADE) ×1 IMPLANT
BLADE SURG 15 STRL SS (BLADE) ×2
BNDG COHESIVE 1X5 TAN STRL LF (GAUZE/BANDAGES/DRESSINGS) ×3 IMPLANT
BNDG CONFORM 2 STRL LF (GAUZE/BANDAGES/DRESSINGS) ×3 IMPLANT
COVER SURGICAL LIGHT HANDLE (MISCELLANEOUS) ×3 IMPLANT
COVER WAND RF STERILE (DRAPES) IMPLANT
DRAPE LAPAROTOMY T 102X78X121 (DRAPES) ×3 IMPLANT
ELECT PENCIL ROCKER SW 15FT (MISCELLANEOUS) ×3 IMPLANT
ELECT REM PT RETURN 15FT ADLT (MISCELLANEOUS) ×3 IMPLANT
GAUZE 4X4 16PLY RFD (DISPOSABLE) ×3 IMPLANT
GAUZE PETROLATUM 1 X8 (GAUZE/BANDAGES/DRESSINGS) ×3 IMPLANT
GLOVE BIOGEL M STRL SZ7.5 (GLOVE) ×3 IMPLANT
GOWN STRL REUS W/TWL XL LVL3 (GOWN DISPOSABLE) ×3 IMPLANT
KIT BASIN OR (CUSTOM PROCEDURE TRAY) ×3 IMPLANT
KIT TURNOVER KIT A (KITS) IMPLANT
NEEDLE HYPO 22GX1.5 SAFETY (NEEDLE) ×3 IMPLANT
NS IRRIG 1000ML POUR BTL (IV SOLUTION) IMPLANT
PACK BASIC VI WITH GOWN DISP (CUSTOM PROCEDURE TRAY) ×3 IMPLANT
PAD TELFA 2X3 NADH STRL (GAUZE/BANDAGES/DRESSINGS) ×3 IMPLANT
SUT CHROMIC 3 0 PS 2 (SUTURE) IMPLANT
SUT CHROMIC 3 0 SH 27 (SUTURE) IMPLANT
SUT MNCRL AB 3-0 PS2 18 (SUTURE) ×2 IMPLANT
SYR CONTROL 10ML LL (SYRINGE) IMPLANT
TOWEL OR 17X26 10 PK STRL BLUE (TOWEL DISPOSABLE) ×3 IMPLANT
TOWEL OR NON WOVEN STRL DISP B (DISPOSABLE) ×3 IMPLANT

## 2019-06-11 NOTE — Anesthesia Procedure Notes (Signed)
Procedure Name: LMA Insertion Date/Time: 06/11/2019 10:23 AM Performed by: Lavina Hamman, CRNA Pre-anesthesia Checklist: Patient identified, Emergency Drugs available, Suction available and Patient being monitored Patient Re-evaluated:Patient Re-evaluated prior to induction Oxygen Delivery Method: Circle System Utilized Preoxygenation: Pre-oxygenation with 100% oxygen Induction Type: IV induction Ventilation: Mask ventilation without difficulty LMA: LMA inserted LMA Size: 4.0 Number of attempts: 2 Airway Equipment and Method: Bite block Placement Confirmation: positive ETCO2 Tube secured with: Tape Dental Injury: Teeth and Oropharynx as per pre-operative assessment  Comments: Pt has goiter.  Difficult to place LMA without leak.

## 2019-06-11 NOTE — Anesthesia Postprocedure Evaluation (Signed)
Anesthesia Post Note  Patient: Earl [name]  Procedure(s) Performed: CIRCUMCISION ADULT (N/A Penis)     Patient location during evaluation: PACU Anesthesia Type: General Level of consciousness: awake and alert and oriented Pain management: pain level controlled Vital Signs Assessment: post-procedure vital signs reviewed and stable Respiratory status: spontaneous breathing, nonlabored ventilation and respiratory function stable Cardiovascular status: blood pressure returned to baseline and stable Postop Assessment: no apparent nausea or vomiting Anesthetic complications: no    Last Vitals:  Vitals:   06/11/19 1130 06/11/19 1210  BP: (!) 143/84 (!) 143/78  Pulse: 78 80  Resp: 17 16  Temp: 36.6 C 36.5 C  SpO2: 100% 95%    Last Pain:  Vitals:   06/11/19 1210  TempSrc:   PainSc: 3                  [place] Want A.

## 2019-06-11 NOTE — Discharge Instructions (Signed)
Circumcision-Home Care Instructions  The following instructions have been prepared to help you care for yourself upon your return home today.   Wound Care & Hygiene:   You may apply ice to the penis.  This may help to decrease swelling.  Remove the dressing tomorrow.  If the dressing falls off before then, leave it off.  You may shower or bathe in 48 hours  Gently wash the penis with soap and water.  The stitches do not need to be removed.  Activity:  Do not drive or operate any equipment today.  The effects of anesthesia are still present, drowsiness may result.  Rest today, not necessarily flat bed rest, just take it easy.  You may resume your normal activity in one to two days or as indicated by your physician.  Sexual Activity:  Erection and sexual relations should be avoided for *2 weeks.  Return to Work:  One to two days Diet:  Drink liquids or eat a very light diet this evening.  You may resume a regular diet tomorrow.  General Expectations of your surgery:   You may have a small amount of bleeding  The penis will be swollen and bruised for approximately one week  You may wake during the night with an erection, usually this is caused by having a full bladder so you should try to urinate (pass your water) to relieve the erection or apply ice to the penis  Unexpected Observations - Call your doctor if these occur!  Persistent or heavy bleeding  Temperature of 101 degrees or more  Severe pain not relieved by medication  Return to Office We will set up an appointment in approximatley 4 weeks. We will call you with a date and time. If you have not heard from [place] by next week please call the office

## 2019-06-11 NOTE — Op Note (Addendum)
Pre-operative diagnosis: Phimosis  Postoperative diagnosis: Same  Procedure: Circumcision  Surgeon: Dr. [name] Asst: Dr. Marye Round   Anesthesia: General  Complications: None  EBL: Minimal  Specimens: None  Indication: Earl [name] is a 78 y.o. male who was diagnosed with phimosis refractory to topical medications.  He elected to proceed with circumcision for treatment. The potential risks, complications, alternative options, and expected recovery process was discussed in detail and informed consent was obtained.  Findings: normal foreskin, glans meatus apart from depigmentation of the foreskin. Goiter noted by anesthesia. I discussed post-op with son Earl [name] and he said he and his dad know about it and the goiter has been evaluated and treated.   Description of procedure: The patient was taken to the operating room and a general anesthetic was administered. He was given preoperative antibiotics, placed in the supine position, and his genitalia was prepped and draped in the usual sterile fashion. Next, a preoperative timeout was performed.  A dorsal penile block was performed with quarter percent bupivacaine without epinephrine.   We began the procedure with a dorsal slit. This allowed [place] to expose the glans. The penis then was examined and appropriate incision sites were marked circumferentially proximal and distally. These incision sites were then incised with the knife circumferentially allowing the skin to be separated distally and approximately and isolating the foreskin. Hemostats were then placed in the dorsal aspect of the foreskin and Metzenbaum scissors were used to create an opening connecting the proximal and distal incision sites. A hemostat was then placed over the dorsal aspect of the foreskin for hemostatic purposes. This area was then divided with Metzenbaum scissors.   The remainder of the foreskin was then excised utilizing cautery dissection as necessary.  Once the foreskin was removed, the underlying penile tissue and dartos fascia was examined and hemostasis was achieved with cautery as necessary.   The proximal and distal dartos layer was approximated in 4 quadrants using buried interrupted 3-0 vicryl sutures. We then brought the skin proximal and distal incision sites were then reapproximated utilizing running 3-0 chromic suture.   Telfa was placed over the incision site and then the penis was wrapped gently with a cling guaze.    The patient tolerated the procedure well and without complications. He was able to be awakened and transferred to the recovery unit in satisfactory condition.   Plan: - Remove dressing tomorrow - Follow up in 2 weeks (requested by [place])

## 2019-06-11 NOTE — Transfer of Care (Signed)
Immediate Anesthesia Transfer of Care Note  Patient: Earl [name]  Procedure(s) Performed: Procedure(s): CIRCUMCISION ADULT (N/A)  Patient Location: PACU  Anesthesia Type:General  Level of Consciousness:  sedated, patient cooperative and responds to stimulation  Airway & Oxygen Therapy:Patient Spontanous Breathing and Patient connected to face mask oxgen  Post-op Assessment:  Report given to PACU RN and Post -op Vital signs reviewed and stable  Post vital signs:  Reviewed and stable  Last Vitals:  Vitals:   06/11/19 0649  BP: (!) 143/83  Pulse: 81  Resp: 18  Temp: 36.8 C  SpO2: 59%    Complications: No apparent anesthesia complications

## 2019-06-11 NOTE — H&P (Signed)
H&P  Chief Complaint: Phimosis  History of Present Illness: Earl [name] presents for circumcision.  He has used topicals and still can get his foreskin back.  He cannot clean the head of his penis nor access it.  It is bothersome to him and he elects to proceed with circumcision.  He has been well.  No fevers.  About 40 years ago he had an unknown reaction to penicillin.  He does not recall what that is.  We talked about risk of allergy to cefazolin.  Past Medical History:  Diagnosis Date  . Adenomatous colon polyp   . Allergy   . Anal fissure   . Anxiety   . Arthritis   . BPH (benign prostatic hyperplasia)   . Cataract   . Diabetes mellitus ([place])   . GERD (gastroesophageal reflux disease)   . Hyperlipidemia   . Hypertension   . Hypothyroidism   . Other testicular hypofunction   . Stroke ([place])   . Type II or unspecified type diabetes mellitus without mention of complication, not stated as uncontrolled    Past Surgical History:  Procedure Laterality Date  . COLONOSCOPY    . INGUINAL HERNIA REPAIR Bilateral    1968 and 2004  . LOOP RECORDER INSERTION N/A 07/28/2017   Procedure: LOOP RECORDER INSERTION;  Surgeon: Deboraha Sprang, MD;  Location: [place] [facility];  Service: Cardiovascular;  Laterality: N/A;  . TEE WITHOUT CARDIOVERSION N/A 07/28/2017   Procedure: TRANSESOPHAGEAL ECHOCARDIOGRAM (TEE);  Surgeon: Sueanne Margarita, MD;  Location: [facility] ENDOSCOPY;  Service: Cardiovascular;  Laterality: N/A;    Home Medications:  Medications Prior to Admission  Medication Sig Dispense Refill Last Dose  . atorvastatin (LIPITOR) 40 MG tablet TAKE 1 TABLET BY MOUTH  DAILY AT 6 PM (Patient taking differently: Take 40 mg by mouth at bedtime. ) 90 tablet 1 06/09/2019  . Cholecalciferol (VITAMIN D PO) Take 1 tablet by mouth daily.    06/09/2019  . Cyanocobalamin (B-12) 50 MCG TABS Take 50 mcg by mouth daily.   06/09/2019  . ELIQUIS 5 MG TABS tablet TAKE 1 TABLET BY MOUTH TWO  TIMES DAILY (Patient  taking differently: Take 5 mg by mouth 2 (two) times daily. ) 180 tablet 1 06/09/2019 at 0730  . finasteride (PROSCAR) 5 MG tablet TAKE 1 TABLET BY MOUTH  EVERY DAY FOR PROSTATE (Patient taking differently: Take 5 mg by mouth at bedtime. ) 90 tablet 3 06/09/2019  . glipiZIDE (GLUCOTROL) 5 MG tablet TAKE 1 TABLET BY MOUTH 2  TIMES DAILY BEFORE MEALS (Patient taking differently: Take 5 mg by mouth 2 (two) times daily before a meal. ) 180 tablet 1 06/10/2019  . hydrocortisone (ANUSOL-HC) 2.5 % rectal cream Apply rectally  2  to 3 x /daily (Patient taking differently: Place 1 application rectally every 8 (eight) hours as needed for anal itching. ) 30 g 3 06/10/2019 at Unknown time  . levothyroxine (SYNTHROID) 50 MCG tablet TAKE 1 TABLET BY MOUTH  DAILY BEFORE BREAKFAST (Patient taking differently: Take 50 mcg by mouth daily before breakfast. ) 90 tablet 1 06/11/2019 at 0515  . lisinopril (ZESTRIL) 5 MG tablet TAKE 1 TABLET BY MOUTH  DAILY (Patient taking differently: Take 5 mg by mouth daily. ) 90 tablet 1 06/09/2019  . Magnesium 250 MG TABS Take 250 mg by mouth 2 (two) times a day.    06/09/2019  . metFORMIN (GLUCOPHAGE-XR) 500 MG 24 hr tablet TAKE 1 TABLET BY MOUTH 4  TIMES DAILY FOR DIABETES (  Patient taking differently: Take 500 mg by mouth 4 (four) times daily -  before meals and at bedtime. ) 360 tablet 3 06/10/2019 at 2215  . Multiple Vitamin (MULTIVITAMIN) capsule Take 1 capsule by mouth daily.     06/09/2019  . omeprazole (PRILOSEC) 20 MG capsule Take 1 capsule (20 mg total) by mouth daily. 44 capsule 0 06/11/2019 at 0515  . vitamin C (ASCORBIC ACID) 500 MG tablet Take 500 mg by mouth daily.     06/09/2019  . ALPRAZolam (XANAX) 1 MG tablet Take 1/2-1 tablet at Bedtime  ONLY if needed for Sleep &  limit to 5 days /week to avoid addiction 90 tablet 0 06/09/2019   Allergies:  Allergies  Allergen Reactions  . Penicillins Other (See Comments)    Did it involve swelling of the face/tongue/throat, SOB, or low BP?  Unknown Did it involve sudden or severe rash/hives, skin peeling, or any reaction on the inside of your mouth or nose? Unknown Did you need to seek medical attention at a hospital or doctor's office? Unknown When did it last happen? Many years ago If all above answers are "NO", may proceed with cephalosporin use.     Family History  Problem Relation Age of Onset  . Sickle cell anemia Mother   . Diabetes Father   . Stomach cancer Father   . Colon cancer Father   . CAD Neg Hx   . Esophageal cancer Neg Hx   . Rectal cancer Neg Hx    Social History:  reports that he has never smoked. He has never used smokeless tobacco. He reports that he does not drink alcohol or use drugs.  ROS: A complete review of systems was performed.  All systems are negative except for pertinent findings as noted. Review of Systems  All other systems reviewed and are negative.   Physical Exam:  Vital signs in last 24 hours: Temp:  [98.3 F (36.8 C)] 98.3 F (36.8 C) (07/21 0649) Pulse Rate:  [81] 81 (07/21 0649) Resp:  [18] 18 (07/21 0649) BP: (143)/(83) 143/83 (07/21 0649) SpO2:  [97 %] 97 % (07/21 0649) Weight:  [81.1 kg] 81.1 kg (07/21 0600) General:  Alert and oriented, No acute distress HEENT: Normocephalic, atraumatic Cardiovascular: Regular rate and rhythm Lungs: Regular rate and effort Abdomen: Soft, nontender, nondistended, no abdominal masses Back: No CVA tenderness Extremities: No edema Neurologic: Grossly intact GU: Tight phimosis.  Unable to retract foreskin.  Loss of pigmentation of foreskin.  No palpable mass.  No inguinal lymphadenopathy.  Scrotum appears normal.  Otherwise penis appears normal and palpably normal.  Laboratory Data:  Results for orders placed or performed during the hospital encounter of 06/11/19 (from the past 24 hour(s))  Glucose, capillary     Status: Abnormal   Collection Time: 06/11/19  6:47 AM  Result Value Ref Range   Glucose-Capillary 120 (H) 70 - 99  mg/dL   Recent Results (from the past 240 hour(s))  SARS Coronavirus 2 (Performed in [facility] hospital lab)     Status: None   Collection Time: 06/07/19 11:45 AM   Specimen: Nasal Swab  Result Value Ref Range Status   SARS Coronavirus 2 NEGATIVE NEGATIVE Final    Comment: (NOTE) SARS-CoV-2 target nucleic acids are NOT DETECTED. The SARS-CoV-2 RNA is generally detectable in upper and lower respiratory specimens during the acute phase of infection. Negative results do not preclude SARS-CoV-2 infection, do not rule out co-infections with other pathogens, and should not be used as  the sole basis for treatment or other patient management decisions. Negative results must be combined with clinical observations, patient history, and epidemiological information. The expected result is Negative. Fact Sheet for Patients: SugarRoll.be Fact Sheet for Healthcare Providers: https://www.woods-mathews.com/ This test is not yet approved or cleared by the [place] FDA and  has been authorized for detection and/or diagnosis of SARS-CoV-2 by FDA under an Emergency Use Authorization (EUA). This EUA will remain  in effect (meaning this test can be used) for the duration of the COVID-19 declaration under Section 56 4(b)(1) of the Act, 21 U.S.C. section 360bbb-3(b)(1), unless the authorization is terminated or revoked sooner. Performed at [facility], [place] [address], [place], [place] [address]    Creatinine: Recent Labs    06/06/19 1013  CREATININE 0.97    Impression/Assessment:  Phimosis  Plan:  I discussed with the patient the nature, potential benefits, risks and alternatives to circumcision, including side effects of the proposed treatment, the likelihood of the patient achieving the goals of the procedure, and any potential problems that might occur during the procedure or recuperation. He was COVID negative. All questions answered.  Patient elects to proceed.    [name] 06/11/2019, 9:08 AM

## 2019-06-12 ENCOUNTER — Encounter (HOSPITAL_COMMUNITY): Payer: Self-pay | Admitting: Urology

## 2019-06-12 NOTE — Progress Notes (Signed)
[facility] Summary Report / Loop Recorder 

## 2019-06-25 DIAGNOSIS — N471 Phimosis: Secondary | ICD-10-CM | POA: Diagnosis not present

## 2019-06-25 DIAGNOSIS — N476 Balanoposthitis: Secondary | ICD-10-CM | POA: Diagnosis not present

## 2019-07-10 ENCOUNTER — Other Ambulatory Visit: Payer: Self-pay | Admitting: Adult Health

## 2019-07-11 ENCOUNTER — Ambulatory Visit (INDEPENDENT_AMBULATORY_CARE_PROVIDER_SITE_OTHER): Payer: Medicare Other | Admitting: *Deleted

## 2019-07-11 DIAGNOSIS — I480 Paroxysmal atrial fibrillation: Secondary | ICD-10-CM

## 2019-07-11 DIAGNOSIS — I6930 Unspecified sequelae of cerebral infarction: Secondary | ICD-10-CM

## 2019-07-11 LAB — CUP PACEART REMOTE DEVICE CHECK
Date Time Interrogation Session: 20200819161052
Implantable Pulse Generator Implant Date: 20180907

## 2019-07-12 IMAGING — CT CT HEAD W/O CM
3 series · 15 of 47 positions shown, 18 images · non-contrast
Comparison: None.

CLINICAL DATA: Weakness and ataxia.

EXAM:
CT HEAD WITHOUT CONTRAST
TECHNIQUE: Contiguous axial images were obtained from the base of the skull
through the vertex without intravenous contrast.

[Series 3: head 5.0 h30s · axial · 0.45mm/px · z∈[-113,+27]mm · 9 of 34 slices shown, 12 images]
[im 3/34  brain]
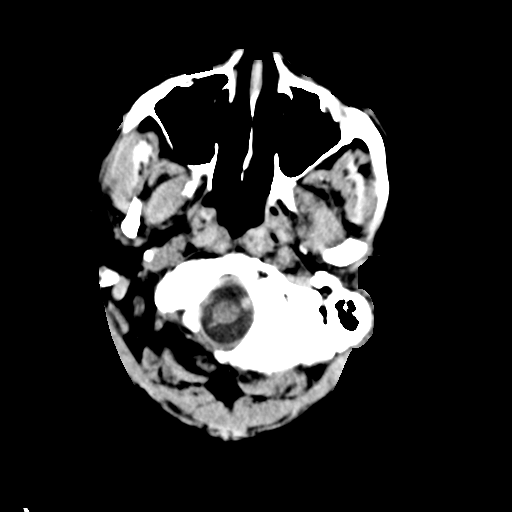
[im 3/34  bone]
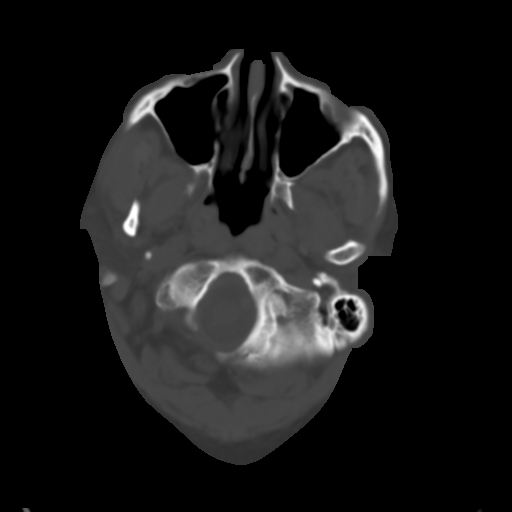
[im 6/34  brain]
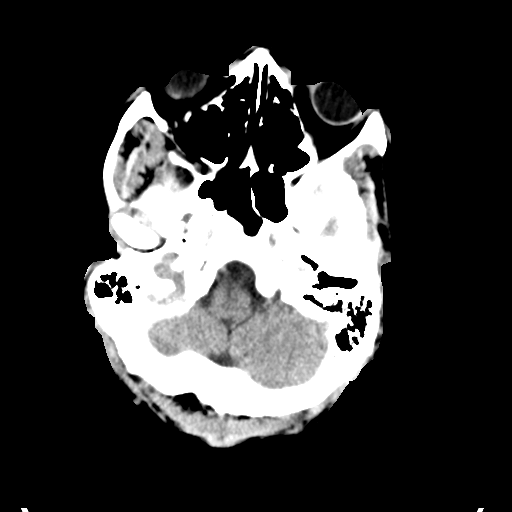
[im 10/34  brain]
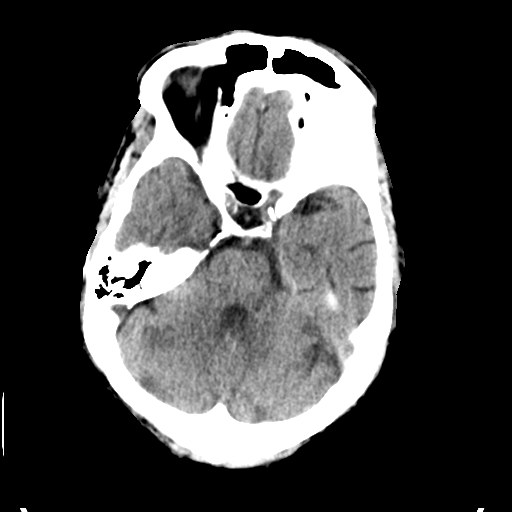
[im 13/34  brain]
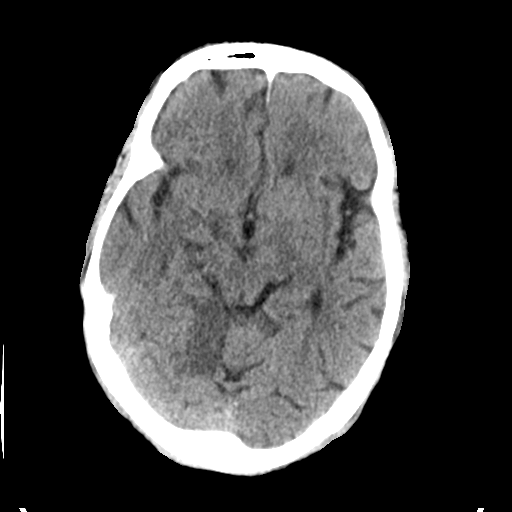
[im 18/34  brain]
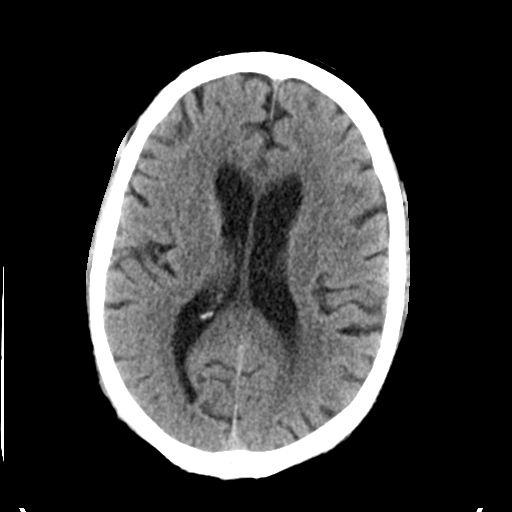
[im 18/34  bone]
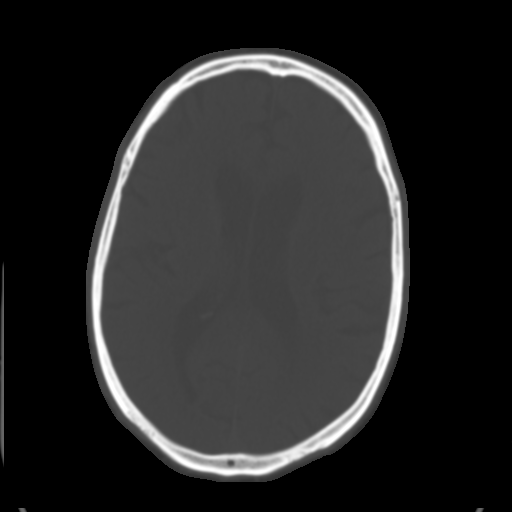
[im 21/34  brain]
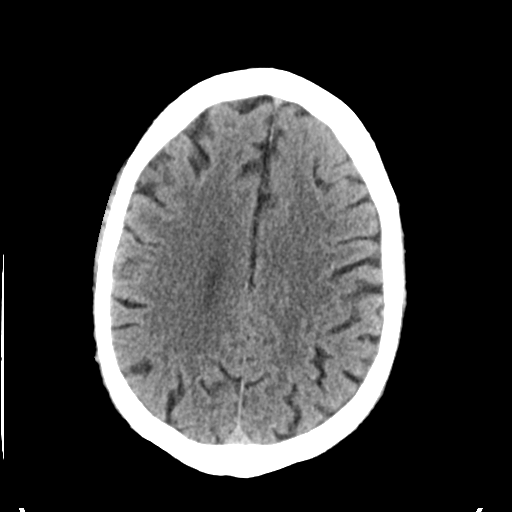
[im 24/34  brain]
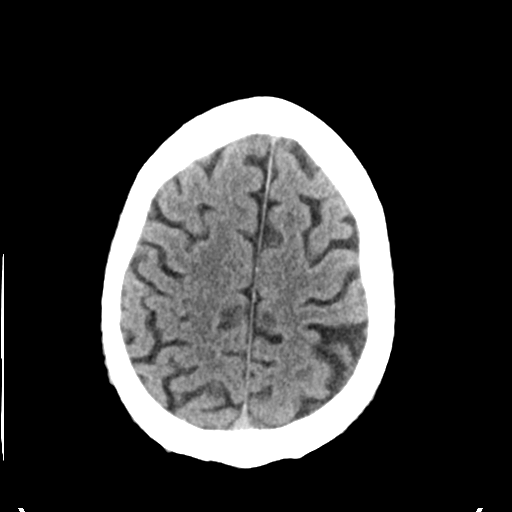
[im 28/34  brain]
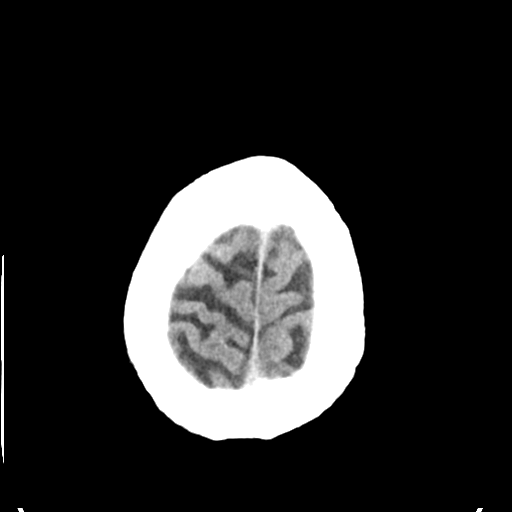
[im 31/34  brain]
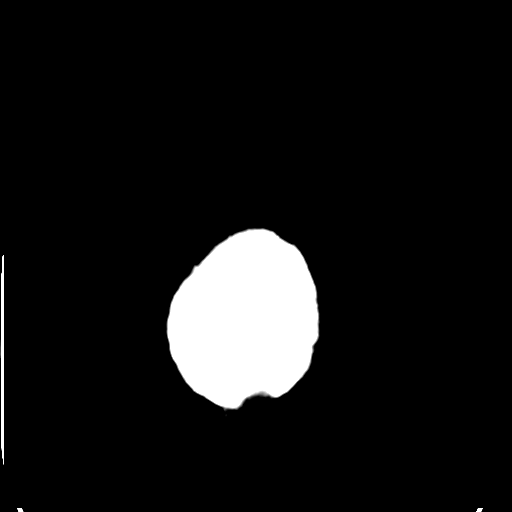
[im 31/34  bone]
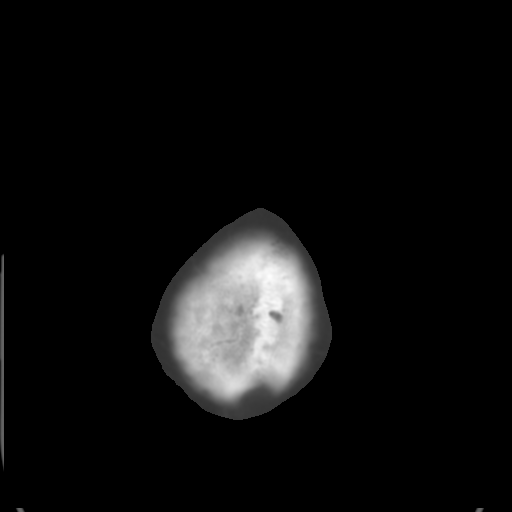

[Series 5: head 3.0 mpr cor · coronal · 0.33mm/px · 3 of 71 slices shown]
[im 24/71  brain]
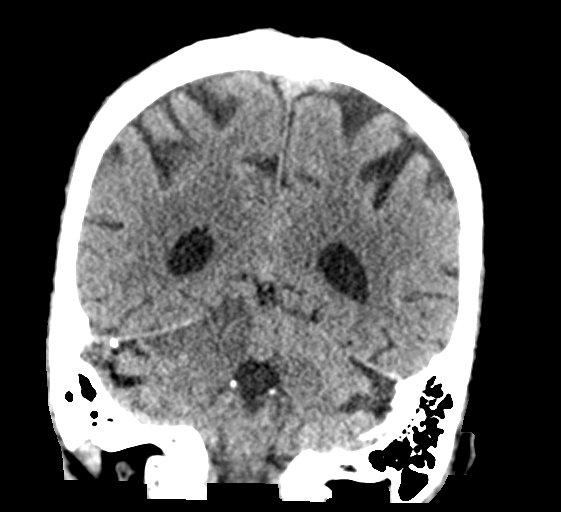
[im 32/71  brain]
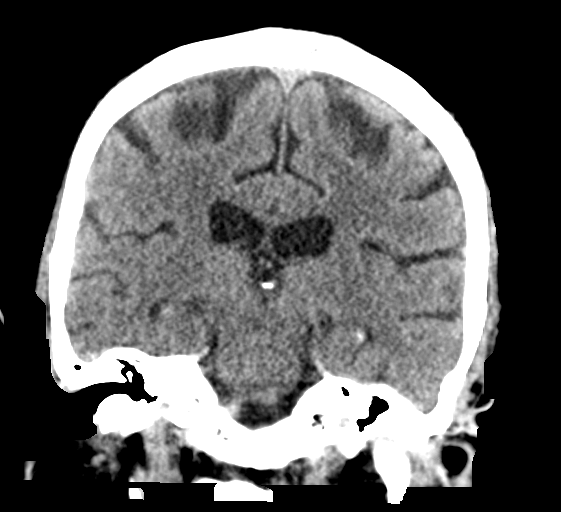
[im 39/71  brain]
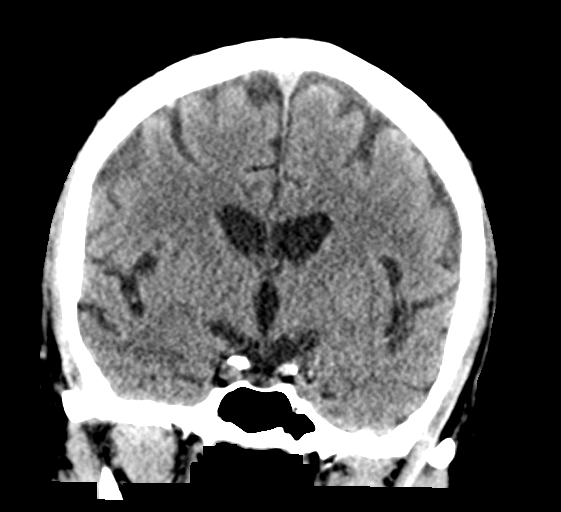

[Series 6: head 3.0 mpr sag · sagittal · 0.33mm/px · 3 of 58 slices shown]
[im 20/58  brain]
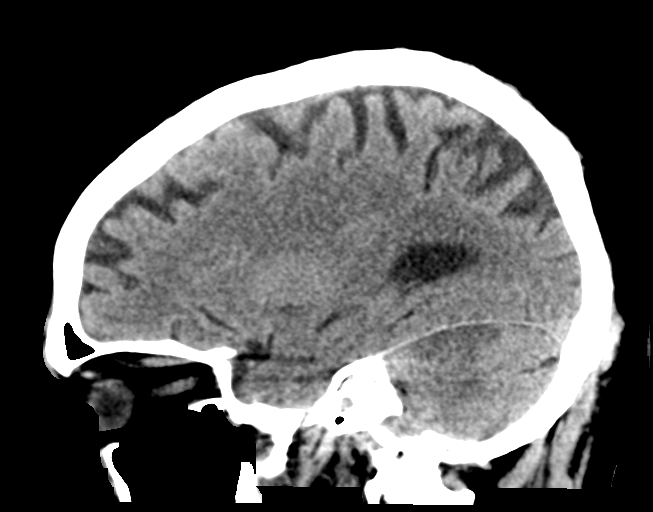
[im 29/58  brain]
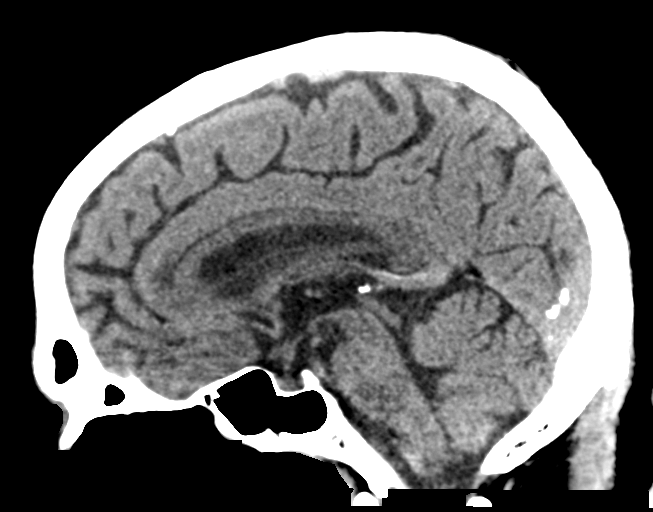
[im 39/58  brain]
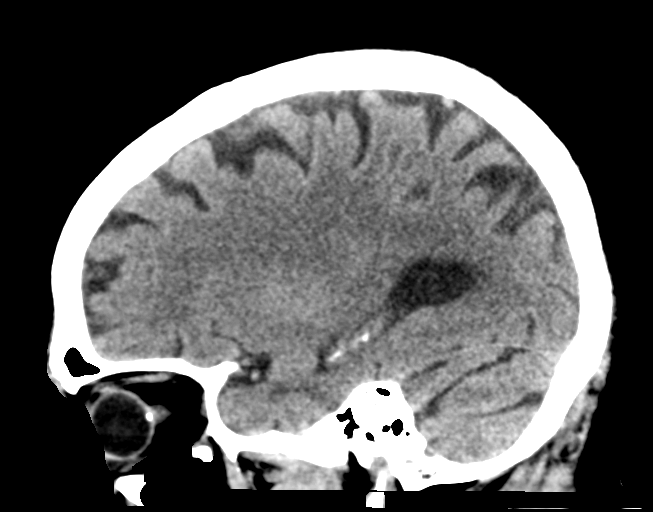

[15 of 47 positions shown; findings below may reference images not displayed]

FINDINGS: Brain: There is a moderate-sized, wedge-shaped region of edema in
the right cerebellum consistent with acute SCA territory infarct. No
intracranial hemorrhage, mass, midline shift, or extra-axial fluid
collection is identified. Mild cerebral atrophy is within normal
limits for age.

Vascular: Carotid siphon atherosclerosis.  No hyperdense vessel.

Skull: No fracture focal osseous lesion.

Sinuses/Orbits: Visualized paranasal sinuses and mastoid air cells
are clear. Bilateral cataract extraction is noted.

Other: None.
IMPRESSION: 1. Acute right cerebellar SCA territory infarct.
2. No intracranial hemorrhage.

## 2019-07-19 NOTE — Progress Notes (Signed)
[facility] Summary Report / Loop Recorder 

## 2019-07-30 ENCOUNTER — Other Ambulatory Visit: Payer: Self-pay | Admitting: Internal Medicine

## 2019-07-30 DIAGNOSIS — Z79899 Other long term (current) drug therapy: Secondary | ICD-10-CM

## 2019-07-30 MED ORDER — ALPRAZOLAM 1 MG PO TABS: ORAL_TABLET | ORAL | 0 refills | Status: DC

## 2019-07-31 DIAGNOSIS — N471 Phimosis: Secondary | ICD-10-CM | POA: Diagnosis not present

## 2019-08-06 ENCOUNTER — Other Ambulatory Visit: Payer: Self-pay

## 2019-08-06 NOTE — Patient Outreach (Signed)
[facility]) Care Management  08/06/2019  Earl [name] Oct 03, 1941 [id number]   Medication Adherence call to [name] Hippa Identifiers Verify spoke with patient he is past due on Atorvastatin 40 mg patient said he wants a call back patient is not sure if he is to be taking this medication. [name] is showing past due under [place].   [place] Management Direct Dial [phone]  Fax [phone] [email or web address]

## 2019-08-12 ENCOUNTER — Ambulatory Visit (INDEPENDENT_AMBULATORY_CARE_PROVIDER_SITE_OTHER): Payer: Medicare Other | Admitting: *Deleted

## 2019-08-12 DIAGNOSIS — I639 Cerebral infarction, unspecified: Secondary | ICD-10-CM | POA: Diagnosis not present

## 2019-08-13 LAB — CUP PACEART REMOTE DEVICE CHECK
Date Time Interrogation Session: 20200921164109
Implantable Pulse Generator Implant Date: 20180907

## 2019-08-20 NOTE — Progress Notes (Signed)
[facility] Summary Report / Loop Recorder 

## 2019-09-06 ENCOUNTER — Ambulatory Visit (INDEPENDENT_AMBULATORY_CARE_PROVIDER_SITE_OTHER): Payer: Medicare Other | Admitting: Internal Medicine

## 2019-09-06 ENCOUNTER — Other Ambulatory Visit: Payer: Self-pay

## 2019-09-06 VITALS — BP 144/60 | HR 100 | Temp 97.2°F | Resp 18 | Ht 72.0 in | Wt 171.6 lb

## 2019-09-06 DIAGNOSIS — I10 Essential (primary) hypertension: Secondary | ICD-10-CM

## 2019-09-06 DIAGNOSIS — E1122 Type 2 diabetes mellitus with diabetic chronic kidney disease: Secondary | ICD-10-CM | POA: Diagnosis not present

## 2019-09-06 DIAGNOSIS — R531 Weakness: Secondary | ICD-10-CM

## 2019-09-06 DIAGNOSIS — Z23 Encounter for immunization: Secondary | ICD-10-CM

## 2019-09-06 DIAGNOSIS — E782 Mixed hyperlipidemia: Secondary | ICD-10-CM | POA: Diagnosis not present

## 2019-09-06 DIAGNOSIS — N182 Chronic kidney disease, stage 2 (mild): Secondary | ICD-10-CM | POA: Diagnosis not present

## 2019-09-06 DIAGNOSIS — Z79899 Other long term (current) drug therapy: Secondary | ICD-10-CM

## 2019-09-06 NOTE — Patient Instructions (Signed)
++++++++++++++++++++++++++++++++++    Stop Lisinopril for Now   ++++++++++++++++++++++++++++++++++  Check with your Insurance Company to see  which Glucometer  / blood Sugar  that they will cover ++++++++++++++++++++++++++++++++++

## 2019-09-07 LAB — CBC WITH DIFFERENTIAL/PLATELET
Absolute Monocytes: 450 cells/uL (ref 200–950)
Basophils Absolute: 51 cells/uL (ref 0–200)
Basophils Relative: 0.9 %
Eosinophils Absolute: 40 cells/uL (ref 15–500)
Eosinophils Relative: 0.7 %
HCT: 38.6 % (ref 38.5–50.0)
Hemoglobin: 13.4 g/dL (ref 13.2–17.1)
Lymphs Abs: 1425 cells/uL (ref 850–3900)
MCH: 28.8 pg (ref 27.0–33.0)
MCHC: 34.7 g/dL (ref 32.0–36.0)
MCV: 83 fL (ref 80.0–100.0)
MPV: 11.1 fL (ref 7.5–12.5)
Monocytes Relative: 7.9 %
Neutro Abs: 3734 cells/uL (ref 1500–7800)
Neutrophils Relative %: 65.5 %
Platelets: 205 10*3/uL (ref 140–400)
RBC: 4.65 10*6/uL (ref 4.20–5.80)
RDW: 12.7 % (ref 11.0–15.0)
Total Lymphocyte: 25 %
WBC: 5.7 10*3/uL (ref 3.8–10.8)

## 2019-09-07 LAB — COMPLETE METABOLIC PANEL WITH GFR
AG Ratio: 1.8 (calc) (ref 1.0–2.5)
ALT: 22 U/L (ref 9–46)
AST: 18 U/L (ref 10–35)
Albumin: 3.9 g/dL (ref 3.6–5.1)
Alkaline phosphatase (APISO): 65 U/L (ref 35–144)
BUN: 16 mg/dL (ref 7–25)
CO2: 27 mmol/L (ref 20–32)
Calcium: 9.3 mg/dL (ref 8.6–10.3)
Chloride: 106 mmol/L (ref 98–110)
Creat: 0.98 mg/dL (ref 0.70–1.18)
GFR, Est African American: 85 mL/min/{1.73_m2} (ref >=60)
GFR, Est Non African American: 74 mL/min/{1.73_m2} (ref >=60)
Globulin: 2.2 g/dL (calc) (ref 1.9–3.7)
Glucose, Bld: 156 mg/dL — ABNORMAL HIGH (ref 65–99)
Potassium: 4.1 mmol/L (ref 3.5–5.3)
Sodium: 142 mmol/L (ref 135–146)
Total Bilirubin: 0.6 mg/dL (ref 0.2–1.2)
Total Protein: 6.1 g/dL (ref 6.1–8.1)

## 2019-09-07 LAB — LIPID PANEL
Cholesterol: 110 mg/dL (ref <200)
HDL: 56 mg/dL (ref >=40)
LDL Cholesterol (Calc): 40 mg/dL (calc)
Non-HDL Cholesterol (Calc): 54 mg/dL (calc) (ref <130)
Total CHOL/HDL Ratio: 2 (calc) (ref <5.0)
Triglycerides: 48 mg/dL (ref <150)

## 2019-09-07 LAB — HEMOGLOBIN A1C
Hgb A1c MFr Bld: 6.3 % of total Hgb — ABNORMAL HIGH (ref <5.7)
Mean Plasma Glucose: 134 (calc)
eAG (mmol/L): 7.4 (calc)

## 2019-09-07 LAB — MAGNESIUM: Magnesium: 1.7 mg/dL (ref 1.5–2.5)

## 2019-09-07 LAB — TSH: TSH: 0.57 mIU/L (ref 0.40–4.50)

## 2019-09-08 ENCOUNTER — Encounter: Payer: Self-pay | Admitting: Internal Medicine

## 2019-09-08 NOTE — Progress Notes (Signed)
History of Present Illness:     This is a very nice 78 y.o.  recently widowed BM with  HTN, HLD, T2_NIDDM,  GERD, MGUS and Vitamin D Deficiency who present emergently today with non specific c/o weakness & fatigue.  Systems review is negative for Respiratory, GI, Cardiac or GU sx's.   Medications  .  glipiZIDE (GLUCOTROL) 5 MG tablet, TAKE 1 TABLET BY MOUTH 2  TIMES DAILY BEFORE MEALS (Patient taking differently: Take 5 mg by mouth 2 (two) times daily before a meal. ) .  levothyroxine (SYNTHROID) 50 MCG tablet, TAKE 1 TABLET BY MOUTH  DAILY BEFORE BREAKFAST (Patient taking differently: Take 50 mcg by mouth daily before breakfast. ) .  metFORMIN (GLUCOPHAGE-XR) 500 MG 24 hr tablet, TAKE 1 TABLET BY MOUTH 4  TIMES DAILY FOR DIABETES (Patient taking differently: Take 500 mg by mouth 4 (four) times daily -  before meals and at bedtime. ) .  atorvastatin (LIPITOR) 40 MG tablet, TAKE 1 TABLET BY MOUTH  DAILY AT 6 PM (Patient taking differently: Take 40 mg by mouth at bedtime. ) .  lisinopril (ZESTRIL) 5 MG tablet, TAKE 1 TABLET BY MOUTH  DAILY (Patient taking differently: Take 5 mg by mouth daily. ) .  apixaban (ELIQUIS) 5 MG TABS tablet, Take 1 tablet 2 x /day to Prevent Blood Clots .  Cyanocobalamin (B-12) 50 MCG TABS, Take 50 mcg by mouth daily. [name]  ALPRAZolam (XANAX) 1 MG tablet, Take 1/2-1 tablet at Bedtime  ONLY if needed for Sleep &  limit to 5 days /week to avoid addiction .  Cholecalciferol (VITAMIN D PO), Take 1 tablet by mouth daily.  .  finasteride (PROSCAR) 5 MG tablet, TAKE 1 TABLET BY MOUTH  EVERY DAY FOR PROSTATE (Patient taking differently: Take 5 mg by mouth at bedtime. ) .  hydrocortisone (ANUSOL-HC) 2.5 % rectal cream, Apply rectally  2  to 3 x /daily (Patient taking differently: Place 1 application rectally every 8 (eight) hours as needed for anal itching. ) .  Magnesium 250 MG TABS, Take 250 mg by mouth 2 (two) times a day.  .  Multiple Vitamin (MULTIVITAMIN) capsule, Take 1  capsule by mouth daily.   [name]  omeprazole (PRILOSEC) 20 MG capsule, Take 1 capsule (20 mg total) by mouth daily. .  vitamin C (ASCORBIC ACID) 500 MG tablet, Take 500 mg by mouth daily.    Problem list He has Paroxysmal atrial fibrillation ([place]); MGUS (monoclonal gammopathy of unknown significance); Anemia; Essential hypertension; Hyperlipidemia; Type 2 diabetes mellitus (HCC); BPH (benign prostatic hyperplasia); Medication management; Vitamin D deficiency; Diabetic mononeuritis multiplex, right abdomen; Medicare annual wellness visit, subsequent; Hypothyroidism; History of CVA with residual deficit; Hypoalbuminemia due to protein-calorie malnutrition ([place]); Ataxia, post-stroke; Labile blood glucose; Chronic anticoagulation; CKD stage 2 due to type 2 diabetes mellitus ([place]); Goiter; and Late effect of cerebrovascular accident (CVA) on their problem list.   Observations/Objective:  BP 144/60- Sit & 156/80-Stand P 100   T 97.2 F  R 18   Ht 6'    Wt 171.5 lb    BMI 23.27 kg/m   ReCheck    Postural       Sit   BP  140/84     P 95        &        Stand      BP  124/85    P   102  HEENT - WNL. Neck - supple.  Chest - Clear equal BS.  Cor - Nl HS. RRR w/o sig MGR. PP 1(+). No edema. MS- FROM w/o deformities.  Gait Nl. Neuro -  Nl w/o focal abnormalities.  Assessment and Plan:  1. Weakness  - CBC with Differential/Platelet - COMPLETE METABOLIC PANEL WITH GFR - Magnesium - TSH  2. Essential hypertension  - CBC with Differential/Platelet - COMPLETE METABOLIC PANEL WITH GFR - Magnesium - TSH  3. Hyperlipidemia, mixed  - Lipid panel - TSH  4. Type 2 diabetes mellitus with stage 2 chronic kidney  disease, without long-term current use of insulin (HCC)  - Hemoglobin A1c  5. Medication management  - CBC with Differential/Platelet - COMPLETE METABOLIC PANEL WITH GFR - Magnesium - Lipid panel - TSH - Hemoglobin A1c  Follow Up Instructions:        I discussed the assessment  and treatment plan with the patient. The patient was provided an opportunity to ask questions and all were answered. The patient agreed with the plan and demonstrated an understanding of the instructions.  The patient was advised to call back or seek an in-person evaluation if the symptoms worsen or if the condition fails to improve as anticipated. Between 15-20 minutes of counseling, chart review, and critical decision making was performed  Kirtland Bouchard, MD

## 2019-09-09 ENCOUNTER — Other Ambulatory Visit: Payer: Self-pay | Admitting: *Deleted

## 2019-09-09 DIAGNOSIS — D6869 Other thrombophilia: Secondary | ICD-10-CM | POA: Insufficient documentation

## 2019-09-09 DIAGNOSIS — I700 Atherosclerosis of aorta: Secondary | ICD-10-CM | POA: Insufficient documentation

## 2019-09-09 MED ORDER — ONETOUCH ULTRA 2 W/DEVICE KIT: PACK | 0 refills | Status: DC

## 2019-09-09 MED ORDER — ONETOUCH ULTRA VI STRP: ORAL_STRIP | 2 refills | Status: DC

## 2019-09-09 MED ORDER — ONETOUCH SURESOFT LANCING DEV MISC: 0 refills | Status: DC

## 2019-09-09 NOTE — Progress Notes (Signed)
FOLLOW UP  Assessment and Plan:   P. A. Fib CHADSVASc - 6 ; Treated by elequis; currently has loop recorder No signs of excessive bleeding - reminded to present to ED for any falls/head injury followed by cardiology  Acquired thrombophilia ([place]) R/t a. F. Fib; on elequis  Abdominal aortic atherosclerosis ([place]) Per CT 2016;  Control blood pressure, cholesterol, glucose, increase exercise.   Hx of CVA with residual deficit Control blood pressure, cholesterol, glucose, increase exercise.  Follows with neurology On elequis due to p. A. Fib   Hypertension Now off of lisinopril due to weakness/hypotnesion  Monitor blood pressure at home; patient to call if consistently greater than 130/80 Continue DASH diet.   Reminder to go to the ER if any CP, SOB, nausea, dizziness, severe HA, changes vision/speech, left arm numbness and tingling and jaw pain.  Cholesterol LDL goal <70 Currently at goal; continue atorvastatin Continue low cholesterol diet and exercise.   Check lipid panel q6m Diabetes with diabetic chronic kidney disease and with other circulatory complications Continue medication: metformin, glipizide Continue diet and exercise.  Perform daily foot/skin check, notify office of any concerning changes.  He is picking up glucometer today; reviewed appropriate  Check A1C q328mDiabetic neuropathy (HCC) Painful neuropathy in bil feet at night; well managed by topicals PRN Foot exam completed and normal today  Hypothyroidism continue medications the same pending lab results reminded to take on an empty stomach 30-6067m before food.  check TSH level q3m 70mi 23 Continue to recommend diet heavy in fruits and veggies and low in animal meats, cheeses, and dairy products, appropriate calorie intake Discuss exercise recommendations routinely Continue to monitor weight at each visit  Vitamin D Def Near goal at last visit; continue supplementation to maintain goal of  70-100 Defer Vit D level   Hypoalbuminemia due to Protein-calorie malnutrition (HCC) Monitor via CMP/GFR, increase protein intake, add ensure/boost if needed  Grief R/t wife's recent passing with insomnia; he is managing fairly with PRN xanax, limiting use. Alternatively suggested trialing melatonin/benadryl in the evening to avoid daily use.  He reports during the day his mood is fair, improving. Declines daily depression medication at this time.   Weakness Resolved after discontinuing lisinopril Start checking fasting glucose in AM and if feeling bad; call office if any <80, will plan to decrease glipizide  Defer all labs today as just had checked 4 days ago    Continue diet and meds as discussed. Further disposition pending results of labs. Discussed med's effects and SE's.   Over 30 minutes of exam, counseling, chart review, and critical decision making was performed.   Future Appointments  Date Time Provider [place]/26/2020  7:50 AM [facility] DEVICE REMOTES [facility]  11/12/2019 10:00 AM PateJamse Arn [facility] CPR  12/10/2019 11:00 AM McKeUnk Pinto [facility] None  12/13/2019  8:00 AM [facility] LAB 6 [facility] None  12/20/2019  1:30 PM ShadWyatt [place] [facility] None  03/10/2020  2:00 PM McClGarnet [place] [facility] None  07/13/2020 10:00 AM McKeUnk Pinto [facility] None    ----------------------------------------------------------------------------------------------------------------------  HPI 78 y59. male  presents for 3 month follow up on hypertension, cholesterol, diabetes, weight and vitamin D deficiency.   He was just seen by Dr. MckeMelford Aase had all labs, dizzy/weak, wife recently passed away from recurrent metastatic breast cancer on 07/13/2019.   He is prescribed xanax 0.5 mg-1 mg PRN for anxiety and sleep; reports he is taking 1/2  tab in the evenings some days. He reports his mood is doing fairly.    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 He is followed by neurology. He has progressed from walker to wide based cane if needed when out, but does ambulate independently in his home. Questionable hx of a. Fib was noted while hospitalized with stroke; previously negative on 30 day holter; TEE was negative, loop recorder was placed which did note an episode of a. Fib/flutter. He has been on elequis since and followed closely by cardiology.   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   BMI is Body mass index is 23.82 kg/m., he has been working on diet and exercise, he has been walking more Wt Readings from Last 3 Encounters:  09/10/19 175 lb 9.6 oz (79.7 kg)  09/06/19 171 lb 9.6 oz (77.8 kg)  06/11/19 178 lb 11.2 oz (81.1 kg)   His blood pressure has been controlled at home, today their BP is BP: 110/62  He does workout. He denies chest pain, shortness of breath, dizziness.   He is on cholesterol medication Atorvastatin 40 mg daily and denies myalgias. His cholesterol is at goal. The cholesterol last visit was:   Lab Results  Component Value Date   CHOL 110 09/06/2019   HDL 56 09/06/2019   LDLCALC 40 09/06/2019   TRIG 48 09/06/2019   CHOLHDL 2.0 09/06/2019    He has been working on diet and exercise for T2 diabetes treated by metformin and glipizide 5 mg BID, and denies foot ulcerations, hyperglycemia, increased appetite, nausea, polydipsia, polyuria and visual disturbances. He does report burning in feet at night well controlled by OTC topical cream.  He does not check blood sugars, does regular urine glucose dip checks BID which have been normal. Glucometer was recently ordered and he will be picking up from pharmacy today. Last A1C in the office was:  Lab Results  Component Value Date   HGBA1C 6.3 (H) 09/06/2019   Stable stage II CKD is monitored via this office:  Lab Results  Component Value Date   GFRAA 85  09/06/2019   He is on thyroid medication. His medication was not changed last visit. He currently takes 50 mcg S, Tu, Th and 1/2 tab all other days.   Lab Results  Component Value Date   TSH 0.57 09/06/2019   Patient is on Vitamin D supplement.   Lab Results  Component Value Date   VD25OH 64 06/06/2019       Current Medications:  Current Outpatient Medications on File Prior to Visit  Medication Sig  . ALPRAZolam (XANAX) 1 MG tablet Take 1/2-1 tablet at Bedtime  ONLY if needed for Sleep &  limit to 5 days /week to avoid addiction  . apixaban (ELIQUIS) 5 MG TABS tablet Take 1 tablet 2 x /day to Prevent Blood Clots  . atorvastatin (LIPITOR) 40 MG tablet TAKE 1 TABLET BY MOUTH  DAILY AT 6 PM (Patient taking differently: Take 40 mg by mouth at bedtime. )  . Blood Glucose Monitoring Suppl (ONE TOUCH ULTRA 2) w/Device KIT Check blood sugar 1 time a day-DX-E11.9  . Cholecalciferol (VITAMIN D PO) Take 1 tablet by mouth daily.   . Cyanocobalamin (B-12) 50 MCG TABS Take 50 mcg by mouth daily.  . finasteride (PROSCAR) 5 MG tablet TAKE 1 TABLET BY MOUTH  EVERY DAY FOR PROSTATE (Patient taking differently: Take 5 mg by mouth  at bedtime. )  . glipiZIDE (GLUCOTROL) 5 MG tablet TAKE 1 TABLET BY MOUTH 2  TIMES DAILY BEFORE MEALS (Patient taking differently: Take 5 mg by mouth 2 (two) times daily before a meal. )  . glucose blood (ONETOUCH ULTRA) test strip Check blood sugar 1 time a day-DX-E11.9  . hydrocortisone (ANUSOL-HC) 2.5 % rectal cream Apply rectally  2  to 3 x /daily (Patient taking differently: Place 1 application rectally every 8 (eight) hours as needed for anal itching. )  . Lancets Misc. (ONE TOUCH SURESOFT) MISC Check blood sugar 1 time a day-DX-E11.91  . levothyroxine (SYNTHROID) 50 MCG tablet TAKE 1 TABLET BY MOUTH  DAILY BEFORE BREAKFAST (Patient taking differently: Take 50 mcg by mouth daily before breakfast. )  . Magnesium 250 MG TABS Take 250 mg by mouth daily.   . metFORMIN  (GLUCOPHAGE-XR) 500 MG 24 hr tablet TAKE 1 TABLET BY MOUTH 4  TIMES DAILY FOR DIABETES (Patient taking differently: Take 500 mg by mouth 4 (four) times daily -  before meals and at bedtime. )  . Multiple Vitamin (MULTIVITAMIN) capsule Take 1 capsule by mouth daily.    . vitamin C (ASCORBIC ACID) 500 MG tablet Take 500 mg by mouth daily.    [name] lisinopril (ZESTRIL) 5 MG tablet TAKE 1 TABLET BY MOUTH  DAILY (Patient taking differently: Take 5 mg by mouth daily. )  . omeprazole (PRILOSEC) 20 MG capsule Take 1 capsule (20 mg total) by mouth daily. (Patient not taking: Reported on 09/10/2019)   No current facility-administered medications on file prior to visit.      Allergies:  Allergies  Allergen Reactions  . Penicillins Other (See Comments)    Did it involve swelling of the face/tongue/throat, SOB, or low BP? Unknown Did it involve sudden or severe rash/hives, skin peeling, or any reaction on the inside of your mouth or nose? Unknown Did you need to seek medical attention at a hospital or doctor's office? Unknown When did it last happen? Many years ago If all above answers are "NO", may proceed with cephalosporin use.      Medical History:  Past Medical History:  Diagnosis Date  . Adenomatous colon polyp   . Allergy   . Anal fissure   . Anxiety   . Arthritis   . BPH (benign prostatic hyperplasia)   . Cataract   . Diabetes mellitus ([place])   . GERD (gastroesophageal reflux disease)   . Hyperlipidemia   . Hypertension   . Hypothyroidism   . Other testicular hypofunction   . Stroke ([place])   . Type II or unspecified type diabetes mellitus without mention of complication, not stated as uncontrolled    Family history- Reviewed and unchanged Social history- Reviewed and unchanged    Review of Systems:  Review of Systems  Constitutional: Negative for malaise/fatigue and weight loss.  HENT: Negative for hearing loss and tinnitus.   Eyes: Negative for blurred vision and double  vision.  Respiratory: Negative for cough, shortness of breath and wheezing.   Cardiovascular: Negative for chest pain, palpitations, orthopnea, claudication and leg swelling.  Gastrointestinal: Negative for abdominal pain, blood in stool, constipation, diarrhea, heartburn, melena, nausea and vomiting.  Genitourinary: Negative.   Musculoskeletal: Negative for joint pain and myalgias.  Skin: Negative for rash.  Neurological: Positive for tingling (tingling and burning to bilateral feet at night). Negative for dizziness, sensory change, weakness and headaches.  Endo/Heme/Allergies: Negative for polydipsia.  Psychiatric/Behavioral: Negative.   All other systems reviewed and  are negative.   Physical Exam: BP 110/62   Pulse 91   Temp (!) 97.3 F (36.3 C)   Ht 6' (1.829 m)   Wt 175 lb 9.6 oz (79.7 kg)   SpO2 99%   BMI 23.82 kg/m  Wt Readings from Last 3 Encounters:  09/10/19 175 lb 9.6 oz (79.7 kg)  09/06/19 171 lb 9.6 oz (77.8 kg)  06/11/19 178 lb 11.2 oz (81.1 kg)   General Appearance: Well nourished, in no apparent distress. Eyes: PERRLA, EOMs, conjunctiva no swelling or erythema Sinuses: No Frontal/maxillary tenderness ENT/Mouth: Ext aud canals clear, TMs without erythema, bulging. No erythema, swelling, or exudate on post pharynx.  Tonsils not swollen or erythematous. Hearing normal.  Neck: Supple, thyroid normal.  Respiratory: Respiratory effort normal, BS equal bilaterally without rales, rhonchi, wheezing or stridor.  Cardio: RRR with no MRGs. Brisk peripheral pulses without edema.  Abdomen: Soft, + BS.  Non tender, no guarding, rebound, hernias, masses. Lymphatics: Non tender without lymphadenopathy.  Musculoskeletal: Full ROM, 5/5 strength, slow steady gait with cane Skin: Warm, dry without rashes, lesions, ecchymosis.  Neuro: Cranial nerves intact. No cerebellar symptoms. Sensation intact to monofilament in bil feet.  Psych: Awake and oriented X 3, intermittently tearful  affect, Insight and Judgment appropriate.    Izora Ribas, NP 11:25 AM [facility]

## 2019-09-10 ENCOUNTER — Encounter: Payer: Self-pay | Admitting: Adult Health

## 2019-09-10 ENCOUNTER — Ambulatory Visit (INDEPENDENT_AMBULATORY_CARE_PROVIDER_SITE_OTHER): Payer: Medicare Other | Admitting: Adult Health

## 2019-09-10 ENCOUNTER — Other Ambulatory Visit: Payer: Self-pay

## 2019-09-10 VITALS — BP 110/62 | HR 91 | Temp 97.3°F | Ht 72.0 in | Wt 175.6 lb

## 2019-09-10 DIAGNOSIS — I10 Essential (primary) hypertension: Secondary | ICD-10-CM | POA: Diagnosis not present

## 2019-09-10 DIAGNOSIS — E1122 Type 2 diabetes mellitus with diabetic chronic kidney disease: Secondary | ICD-10-CM

## 2019-09-10 DIAGNOSIS — E039 Hypothyroidism, unspecified: Secondary | ICD-10-CM

## 2019-09-10 DIAGNOSIS — I700 Atherosclerosis of aorta: Secondary | ICD-10-CM

## 2019-09-10 DIAGNOSIS — E559 Vitamin D deficiency, unspecified: Secondary | ICD-10-CM

## 2019-09-10 DIAGNOSIS — E8809 Other disorders of plasma-protein metabolism, not elsewhere classified: Secondary | ICD-10-CM

## 2019-09-10 DIAGNOSIS — D6869 Other thrombophilia: Secondary | ICD-10-CM

## 2019-09-10 DIAGNOSIS — Z79899 Other long term (current) drug therapy: Secondary | ICD-10-CM

## 2019-09-10 DIAGNOSIS — I480 Paroxysmal atrial fibrillation: Secondary | ICD-10-CM

## 2019-09-10 DIAGNOSIS — Z6823 Body mass index (BMI) 23.0-23.9, adult: Secondary | ICD-10-CM

## 2019-09-10 DIAGNOSIS — E46 Unspecified protein-calorie malnutrition: Secondary | ICD-10-CM

## 2019-09-10 DIAGNOSIS — R531 Weakness: Secondary | ICD-10-CM

## 2019-09-10 DIAGNOSIS — F4321 Adjustment disorder with depressed mood: Secondary | ICD-10-CM

## 2019-09-10 DIAGNOSIS — E1141 Type 2 diabetes mellitus with diabetic mononeuropathy: Secondary | ICD-10-CM

## 2019-09-10 DIAGNOSIS — E785 Hyperlipidemia, unspecified: Secondary | ICD-10-CM

## 2019-09-10 DIAGNOSIS — I6930 Unspecified sequelae of cerebral infarction: Secondary | ICD-10-CM

## 2019-09-10 DIAGNOSIS — N182 Chronic kidney disease, stage 2 (mild): Secondary | ICD-10-CM

## 2019-09-10 DIAGNOSIS — E1169 Type 2 diabetes mellitus with other specified complication: Secondary | ICD-10-CM

## 2019-09-10 NOTE — Addendum Note (Signed)
Addended by: Melbourne Abts C on: 09/10/2019 11:22 AM   Modules accepted: Orders

## 2019-09-10 NOTE — Patient Instructions (Addendum)
Goals    . DIET - INCREASE WATER INTAKE     4-5 bottles of water daily    . Exercise 150 min/wk Moderate Activity    . Fasting  Blood Glucose 80-130    . HEMOGLOBIN A1C < 7.0        Increase magnesium to twice daily - 250 mg x 2 times a day    Can try melatonin 5mg -15 mg at night for sleep, can also do benadryl 25-50mg  at night for sleep. If this does not help we can try prescription medication.   Start checking fasting sugar twice a week in the morning - before you eat anything Write it down and keep a log to bring with you  Also check if you are feeling bad - dizzy, woozy headed, can't focus, sweating, feeling anxious    Blood Glucose Monitoring, Adult Monitoring your blood sugar (glucose) is an important part of managing your diabetes (diabetes mellitus). Blood glucose monitoring involves checking your blood glucose as often as directed and keeping a record (log) of your results over time. Checking your blood glucose regularly and keeping a blood glucose log can:  Help you and your health care provider adjust your diabetes management plan as needed, including your medicines or insulin.  Help you understand how food, exercise, illnesses, and medicines affect your blood glucose.  Let you know what your blood glucose is at any time. You can quickly find out if you have low blood glucose (hypoglycemia) or high blood glucose (hyperglycemia). Your health care provider will set individualized treatment goals for you. Your goals will be based on your age, other medical conditions you have, and how you respond to diabetes treatment. Generally, the goal of treatment is to maintain the following blood glucose levels:  Before meals (preprandial): 80-130 mg/dL (4.4-7.2 mmol/L).  After meals (postprandial): below 180 mg/dL (10 mmol/L).  A1c level: less than 7%. Supplies needed:  Blood glucose meter.  Test strips for your meter. Each meter has its own strips. You must use the strips that  came with your meter.  A needle to prick your finger (lancet). Do not use a lancet more than one time.  A device that holds the lancet (lancing device).  A journal or log book to write down your results. How to check your blood glucose  1. Wash your hands with soap and water. 2. Prick the side of your finger (not the tip) with the lancet. Use a different finger each time. 3. Gently rub the finger until a small drop of blood appears. 4. Follow instructions that come with your meter for inserting the test strip, applying blood to the strip, and using your blood glucose meter. 5. Write down your result and any notes. Some meters allow you to use areas of your body other than your finger (alternative sites) to test your blood. The most common alternative sites are:  Forearm.  Thigh.  Palm of the hand. If you think you may have hypoglycemia, or if you have a history of not knowing when your blood glucose is getting low (hypoglycemia unawareness), do not use alternative sites. Use your finger instead. Alternative sites may not be as accurate as the fingers, because blood flow is slower in these areas. This means that the result you get may be delayed, and it may be different from the result that you would get from your finger. Follow these instructions at home: Blood glucose log   Every time you check your blood glucose,  write down your result. Also write down any notes about things that may be affecting your blood glucose, such as your diet and exercise for the day. This information can help you and your health care provider: ? Look for patterns in your blood glucose over time. ? Adjust your diabetes management plan as needed.  Check if your meter allows you to download your records to a computer. Most glucose meters store a record of glucose readings in the meter. If you have type 1 diabetes:  Check your blood glucose 2 or more times a day.  Also check your blood glucose: ? Before  every insulin injection. ? Before and after exercise. ? Before meals. ? 2 hours after a meal. ? Occasionally between 2:00 a.m. and 3:00 a.m., as directed. ? Before potentially dangerous tasks, like driving or using heavy machinery. ? At bedtime.  You may need to check your blood glucose more often, up to 6-10 times a day, if you: ? Use an insulin pump. ? Need multiple daily injections (MDI). ? Have diabetes that is not well-controlled. ? Are ill. ? Have a history of severe hypoglycemia. ? Have hypoglycemia unawareness. If you have type 2 diabetes:  If you take insulin or other diabetes medicines, check your blood glucose 2 or more times a day.  If you are on intensive insulin therapy, check your blood glucose 4 or more times a day. Occasionally, you may also need to check between 2:00 a.m. and 3:00 a.m., as directed.  Also check your blood glucose: ? Before and after exercise. ? Before potentially dangerous tasks, like driving or using heavy machinery.  You may need to check your blood glucose more often if: ? Your medicine is being adjusted. ? Your diabetes is not well-controlled. ? You are ill. General tips  Always keep your supplies with you.  If you have questions or need help, all blood glucose meters have a 24-hour "hotline" phone number that you can call. You may also contact your health care provider.  After you use a few boxes of test strips, adjust (calibrate) your blood glucose meter by following instructions that came with your meter. Contact a health care provider if:  Your blood glucose is at or above 240 mg/dL (13.3 mmol/L) for 2 days in a row.  You have been sick or have had a fever for 2 days or longer, and you are not getting better.  You have any of the following problems for more than 6 hours: ? You cannot eat or drink. ? You have nausea or vomiting. ? You have diarrhea. Get help right away if:  Your blood glucose is lower than 54 mg/dL (3 mmol/L).   You become confused or you have trouble thinking clearly.  You have difficulty breathing.  You have moderate or large ketone levels in your urine. Summary  Monitoring your blood sugar (glucose) is an important part of managing your diabetes (diabetes mellitus).  Blood glucose monitoring involves checking your blood glucose as often as directed and keeping a record (log) of your results over time.  Your health care provider will set individualized treatment goals for you. Your goals will be based on your age, other medical conditions you have, and how you respond to diabetes treatment.  Every time you check your blood glucose, write down your result. Also write down any notes about things that may be affecting your blood glucose, such as your diet and exercise for the day. This information is not intended to  replace advice given to you by your health care provider. Make sure you discuss any questions you have with your health care provider. Document Released: 11/10/2003 Document Revised: 08/31/2018 Document Reviewed: 04/18/2016 Elsevier Patient Education  2020 [employer].

## 2019-09-15 LAB — CUP PACEART REMOTE DEVICE CHECK
Date Time Interrogation Session: 20201024164119
Implantable Pulse Generator Implant Date: 20180907

## 2019-09-16 ENCOUNTER — Ambulatory Visit (INDEPENDENT_AMBULATORY_CARE_PROVIDER_SITE_OTHER): Payer: Medicare Other | Admitting: *Deleted

## 2019-09-16 DIAGNOSIS — I480 Paroxysmal atrial fibrillation: Secondary | ICD-10-CM

## 2019-09-17 ENCOUNTER — Telehealth: Payer: Self-pay | Admitting: *Deleted

## 2019-09-17 NOTE — Telephone Encounter (Signed)
Patient called and got the following results on his new glucometer, 169 on 09/16/2019 and 104 today.  Per Dr Melford Aase, the readings are OK.

## 2019-10-04 NOTE — Progress Notes (Signed)
[facility] Summary Report / Loop Recorder 

## 2019-10-07 DIAGNOSIS — H5203 Hypermetropia, bilateral: Secondary | ICD-10-CM | POA: Diagnosis not present

## 2019-10-07 DIAGNOSIS — H52203 Unspecified astigmatism, bilateral: Secondary | ICD-10-CM | POA: Diagnosis not present

## 2019-10-07 DIAGNOSIS — E119 Type 2 diabetes mellitus without complications: Secondary | ICD-10-CM | POA: Diagnosis not present

## 2019-10-07 LAB — HM DIABETES EYE EXAM

## 2019-10-09 ENCOUNTER — Encounter: Payer: Self-pay | Admitting: *Deleted

## 2019-10-11 ENCOUNTER — Other Ambulatory Visit: Payer: Self-pay | Admitting: Internal Medicine

## 2019-10-18 LAB — CUP PACEART REMOTE DEVICE CHECK
Date Time Interrogation Session: 20201126114113
Implantable Pulse Generator Implant Date: 20180907

## 2019-10-21 ENCOUNTER — Ambulatory Visit (INDEPENDENT_AMBULATORY_CARE_PROVIDER_SITE_OTHER): Payer: Medicare Other | Admitting: *Deleted

## 2019-10-21 DIAGNOSIS — I6930 Unspecified sequelae of cerebral infarction: Secondary | ICD-10-CM | POA: Diagnosis not present

## 2019-10-23 ENCOUNTER — Telehealth: Payer: Self-pay

## 2019-10-23 NOTE — Telephone Encounter (Signed)
The pt returned my call. I explained to him that he do not need to send manual transmissions because his monitor automatically transmit. The pt verbalized understanding.

## 2019-10-23 NOTE — Telephone Encounter (Signed)
LMOVM for pt to stop sending manual transmissions with home monitor.

## 2019-10-30 ENCOUNTER — Other Ambulatory Visit: Payer: Self-pay

## 2019-10-30 ENCOUNTER — Encounter: Payer: Self-pay | Admitting: Adult Health

## 2019-10-30 ENCOUNTER — Ambulatory Visit: Payer: Medicare Other | Admitting: Adult Health

## 2019-10-30 VITALS — BP 138/80 | HR 91 | Temp 97.4°F | Ht 71.0 in | Wt 179.0 lb

## 2019-10-30 DIAGNOSIS — I63441 Cerebral infarction due to embolism of right cerebellar artery: Secondary | ICD-10-CM | POA: Diagnosis not present

## 2019-10-30 DIAGNOSIS — I10 Essential (primary) hypertension: Secondary | ICD-10-CM | POA: Diagnosis not present

## 2019-10-30 DIAGNOSIS — I69398 Other sequelae of cerebral infarction: Secondary | ICD-10-CM

## 2019-10-30 DIAGNOSIS — E785 Hyperlipidemia, unspecified: Secondary | ICD-10-CM

## 2019-10-30 DIAGNOSIS — I480 Paroxysmal atrial fibrillation: Secondary | ICD-10-CM

## 2019-10-30 DIAGNOSIS — E1159 Type 2 diabetes mellitus with other circulatory complications: Secondary | ICD-10-CM | POA: Diagnosis not present

## 2019-10-30 DIAGNOSIS — R2689 Other abnormalities of gait and mobility: Secondary | ICD-10-CM

## 2019-10-30 NOTE — Patient Instructions (Signed)
Your fluctuation of discussed symptoms likely due to increased stress and possible decreased activity.  Referral will be placed to neuro rehab physical therapy to improve your ambulation and strengthening.  Continue Eliquis (apixaban) daily  and Lipitor for secondary stroke prevention  Continue to follow up with PCP regarding cholesterol, diabetes and blood pressure management   Continue to monitor blood pressure at home  Maintain strict control of hypertension with blood pressure goal below 130/90, diabetes with hemoglobin A1c goal below 6.5% and cholesterol with LDL cholesterol (bad cholesterol) goal below 70 mg/dL. I also advised the patient to eat a healthy diet with plenty of whole grains, cereals, fruits and vegetables, exercise regularly and maintain ideal body weight.  Followup in the future with me in 3 months or call earlier if needed       Thank you for coming to see [place] at [facility]. I hope we have been able to provide you high quality care today.  You may receive a patient satisfaction survey over the next few weeks. We would appreciate your feedback and comments so that we may continue to improve ourselves and the health of our patients.

## 2019-10-30 NOTE — Progress Notes (Signed)
STROKE NEUROLOGY FOLLOW UP NOTE  NAME: Earl [name] DOB: 03-10-1941  REASON FOR VISIT: stroke follow up HISTORY FROM: pt and wife and chart  Today we had the pleasure of seeing Earl [name] in follow-up at our Neurology Clinic. Pt was accompanied by wife.   History Summary: Earl [name] is a 78 y.o. male with history of diabetes and hypertension who presented with nausea and dizziness. 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 CTA head and neck unremarkable. EF 55-60%. LDL 87 and A1c 6.6. Patient had history of questionable A. fib episode during colonoscopy in the past, had 30 day cardiac event monitoring showed no A. Fib. TEE and remarkable and Loop recorder placed. Discharge to CIR with aspirin and Plavix as well as Lipitor.   History 09/04/17: During the interval time, the patient has been doing well. While he was in CIR, he was found to have afib/aflutter episode on loop recorder. He was put on eliquis 71m bid and DAPT discontinued. Pt doing well still at outpt PT/OT for right sided ataxia. BP good at home, today in clinic 148/89. He did not check finger stick at home but check urine glucose always negative. Continued on eliquis and lipitor without side effects.   UPDATE 02/08/18: Earl [name] being seen today for a four-month follow-up and is accompanied by his wife.  He has been doing well overall with mild complaints of dizziness.  Dizziness does get worse with increasing fatigue.  Continues to take Eliquis without side effects of increasing bleeding or bruising.  Continues to take Lipitor without side effects of myalgias.  A1c 7.0 LDL 42 on 12/21/2017.  PCP managing diabetes and cholesterol.  Blood pressure at today's visit stable at 136/84.  Uses cane for long distance ambulation and to help with balance issues.  Continues to do at home PT exercises to help with balance.  Denies symptoms of sleep apnea such as  snoring, daytime sleepiness, or frequent napping.  Does have a complaint of excessive salivation but denies dysphasia.  This is new since his stroke but has been improving. Denies new or worsening stroke/TIA symptoms.  Update 11/26/2018: Patient is being seen today per patient request due to concerns of increased dizziness, vertigo, head fullness, irregular heart rate and anxiety where patient is concerned for possible TIAs.  He is accompanied today by his son.  Patient states that he has been experiencing this dizziness since his stroke but son and family members have noticed worsening in the past few months.  He states he experiences dizziness while sitting to standing, rapid eye or head movement, or attempting to concentrate/read for long period of time.  He states this can happen multiple times per day and lasts approximately 5 minutes.  He does continue to use his cane without any recent falls but son states at times when he is attempting to stand, he will have a difficult time balancing himself where it appears as though he may fall but fortunately has not done so at this time.  He was started on meclizine by his PCP but denies benefit.  He was also started on Xanax as he is not sleeping but has not noticed much benefit in his dizziness sensation.  He does state that his wife was recently diagnosed with cancer in 08/2018 for the third time and is currently undergoing radiation and chemo which has been placing a lot of increased stress outpatient and anxiety.  Son believes that the  symptoms have worsened since this time.  He does continue to take Eliquis without side effects of bleeding or bruising.  He has not followed up with cardiology since hospital discharge.  He continues to take atorvastatin without side effects myalgias.  Blood pressure today stable at 148/80.  He does not consistently monitor at home but states SBP typically 130s when he is at appointments.  Patient denies chest pain, nausea, headache,  visual changes or any neurological symptoms.  No further concerns at this time.    Virtual visit 02/25/2019: He was stable from a stroke standpoint with residual mild deficits of dizziness and balance difficulties.  Prior office visit on 11/26/2018 per patient request due to concerns of increased dizziness, vertigo, head fullness, irregular heart rate and anxiety and patient was concerned for possible TIAs.  PCP attempted to use meclizine and Xanax but denies benefit of increased dizziness sensation.  He did endorse increased stress during that time.  CT head obtained to ensure no new stroke which was unremarkable.  He did have follow-up visit since this time with cardiology Dr. Caryl Comes with diagnosis of orthostatic hypertension which was likely contributing to his dizziness symptoms.  His endorses overall improvement of dizziness and balance since prior visit as he did participate in outpatient physical therapy. He does continue to use cane for assistance and denies any recent falls. He has not been monitoring blood pressure at home as he does follow closely with PCP. BP has been stable and follows with PCP for ongoing management.  He continues on atorvastatin 40 mg without side effects myalgias. No further concerns at this time. Denies new or worsening stroke/TIA symptoms.   Update 10/30/2019: Earl [name] is a 78 year old male who is being seen today for stroke follow-up accompanied by his son.  He has missed prior follow-up visits and apparently son called office requesting today's visit due to concerns of fluctuating symptoms of prior stroke deficits such as worsening balance, lightheadedness, impairment of gait and decreased right hand dexterity.  He continues to ambulate with a cane and denies any recent falls.  Son does endorse occasional increased slurred speech when fatigued which has been chronic.  Unfortunately, patient's wife 106 years passed away in 2023-07-17 and son has noticed these fluctuations more  prominent since that time with increased stress, depression and anxiety.  Son is concerned regarding reoccurring TIAs.  At prior visits, discussed dizziness concerns with evaluation by cardiologist who felt likely orthostatic in nature.  Patient endorses dizziness or "swoshy head sensation" when sitting to standing but typically subsides rapidly but at times will be accompanied by worsening balance, and lightheadedness and will resolve after resting.  He was started on alprazolam as needed for sleep/anxiety but is not on any other type of antidepressant.  Denies any additional strokelike symptoms and all above symptoms present after prior stroke.  Continues on Eliquis 5 mg twice daily and atorvastatin 40 mg daily for secondary stroke prevention without side effects.  Blood pressure today 138/80.  Glucose levels stable.  No further concerns at this time.         REVIEW OF SYSTEMS: Full 14 system review of systems performed and notable only for those listed below and in HPI above, all others are negative:, Imbalance, walking difficulty, depression and anxiety   The following represents the patient's updated allergies and side effects list: Allergies  Allergen Reactions   Penicillins Other (See Comments)    Did it involve swelling of the face/tongue/throat, SOB, or low BP?  Unknown Did it involve sudden or severe rash/hives, skin peeling, or any reaction on the inside of your mouth or nose? Unknown Did you need to seek medical attention at a hospital or doctor's office? Unknown When did it last happen? Many years ago If all above answers are NO, may proceed with cephalosporin use.    Past Medical History:  Diagnosis Date   Adenomatous colon polyp    Allergy    Anal fissure    Anxiety    Arthritis    BPH (benign prostatic hyperplasia)    Cataract    Diabetes mellitus (HCC)    GERD (gastroesophageal reflux disease)    Hyperlipidemia    Hypertension    Hypothyroidism     Other testicular hypofunction    Stroke ([place])    Type II or unspecified type diabetes mellitus without mention of complication, not stated as uncontrolled    Current Outpatient Medications on File Prior to Visit  Medication Sig Dispense Refill   ALPRAZolam (XANAX) 1 MG tablet Take 1/2-1 tablet at Bedtime  ONLY if needed for Sleep &  limit to 5 days /week to avoid addiction 90 tablet 0   apixaban (ELIQUIS) 5 MG TABS tablet Take 1 tablet 2 x /day to Prevent Blood Clots 180 tablet 3   atorvastatin (LIPITOR) 40 MG tablet TAKE 1 TABLET BY MOUTH  DAILY AT 6 PM (Patient taking differently: Take 40 mg by mouth at bedtime. ) 90 tablet 1   Blood Glucose Monitoring Suppl (ONE TOUCH ULTRA 2) w/Device KIT Check blood sugar 1 time a day-DX-E11.9 1 kit 0   Cholecalciferol (VITAMIN D PO) Take 1 tablet by mouth daily.      Cyanocobalamin (B-12) 50 MCG TABS Take 50 mcg by mouth daily.     finasteride (PROSCAR) 5 MG tablet Take 1 tablet Daily for Prostate 90 tablet 3   glipiZIDE (GLUCOTROL) 5 MG tablet Take 1 tablet 2 x /day with Meals for Diabetes 180 tablet 3   glucose blood (ONETOUCH ULTRA) test strip Check blood sugar 1 time a day-DX-E11.9 100 each 2   hydrocortisone (ANUSOL-HC) 2.5 % rectal cream Apply rectally  2  to 3 x /daily (Patient taking differently: Place 1 application rectally every 8 (eight) hours as needed for anal itching. ) 30 g 3   Lancets Misc. (ONE TOUCH SURESOFT) MISC Check blood sugar 1 time a day-DX-E11.91 1 each 0   levothyroxine (SYNTHROID) 50 MCG tablet Take 1 tablet daily on an empty stomach with only water for 30 minutes & no Antacid meds, Calcium or Magnesium for 4 hours & avoid Biotin 90 tablet 3   lisinopril (ZESTRIL) 5 MG tablet Take 1 tablet Daily for BP  & Diabetic Kidney Protection 90 tablet 3   Magnesium 250 MG TABS Take 250 mg by mouth daily.      metFORMIN (GLUCOPHAGE-XR) 500 MG 24 hr tablet Take 2 tablets 2 x  /day with Meals for Diabetes 360 tablet 3    Multiple Vitamin (MULTIVITAMIN) capsule Take 1 capsule by mouth daily.       vitamin C (ASCORBIC ACID) 500 MG tablet Take 500 mg by mouth daily.       No current facility-administered medications on file prior to visit.    Social History   Socioeconomic History   Marital status: Married    Spouse name: Not on file   Number of children: 2   Years of education: Not on file   Highest education level: Not on file  Occupational History   Occupation: Herbalist: [place] resource strain: Not on file   Food insecurity    Worry: Not on file    Inability: Not on file   Transportation needs    Medical: Not on file    Non-medical: Not on file  Tobacco Use   Smoking status: Never Smoker   Smokeless tobacco: Never Used  Substance and Sexual Activity   Alcohol use: No   Drug use: No   Sexual activity: Not on file  Lifestyle   Physical activity    Days per week: Not on file    Minutes per session: Not on file   Stress: Not on file  Relationships   Social connections    Talks on phone: Not on file    Gets together: Not on file    Attends religious service: Not on file    Active member of club or organization: Not on file    Attends meetings of clubs or organizations: Not on file    Relationship status: Not on file   Intimate partner violence    Fear of current or ex partner: Not on file    Emotionally abused: Not on file    Physically abused: Not on file    Forced sexual activity: Not on file  Other Topics Concern   Not on file  Social History Narrative   Not on file      Physical examination  Today's Vitals   10/30/19 1045  BP: 138/80  Pulse: 91  Temp: (!) 97.4 F (36.3 C)  TempSrc: Oral  Weight: 179 lb (81.2 kg)  Height: _0  (1.803 m)   Body mass index is 24.97 kg/m.   General: well developed, well nourished,  pleasant elderly African-American male, seated, in no evident  distress Head: head normocephalic and atraumatic.   Neck: supple with no carotid or supraclavicular bruits Cardiovascular: regular rate and rhythm, no murmurs Musculoskeletal: no deformity Skin:  no rash/petichiae Vascular:  Normal pulses all extremities   Neurologic Exam Mental Status: Awake and fully alert. Oriented to place and time. Recent and remote memory intact. Attention span, concentration and fund of knowledge appropriate. Mood and affect appropriate.  Cranial Nerves: Pupils equal, briskly reactive to light. Extraocular movements full without nystagmus. Visual fields full to confrontation. Hearing intact. Facial sensation intact. Face, tongue, palate moves normally and symmetrically.  Motor: Normal bulk and tone. Normal strength in all tested extremity muscles with decreased right hand dexterity. Sensory.: intact to touch , pinprick , position and vibratory sensation.  Coordination: Rapid alternating movements normal in all extremities except slightly decreased right hand. Finger-to-nose and heel-to-shin performed accurately on left side with mild ataxia on right side. Gait and Station: Arises from chair without difficulty. Stance is normal. Gait demonstrates  slow cautious steps with mild steppage gait and use of cane and mild imbalance Reflexes: 1+ and symmetric. Toes downgoing.      Data reviewed: I personally reviewed the images and agree with the radiology interpretations.  Ct Angio Head W Or Wo Contrast Ct Angio Neck W Or Wo Contrast 07/26/2017 IMPRESSION: 1. Patent carotid and vertebral arteries. No dissection, aneurysm, or hemodynamically significant stenosis utilizing NASCET criteria. 2. Patent circle of Willis. No large vessel occlusion, aneurysm, or significant stenosis. 3. Right AICA distribution late acute/early subacute infarction. 4. No hemorrhage or abnormal enhancement in the brain. 5. Lipoma within strap muscles anterior to larynx. 6. Large  nodule in left lobe of  thyroid, stable from prior thyroid ultrasounds.  Ct Head Wo Contrast 07/25/2017 IMPRESSION: 1. Acute right cerebellar SCA territory infarct. 2. No intracranial hemorrhage.  Mr Brain Wo Contrast 07/26/2017 IMPRESSION: 1. Acute ischemic superior right cerebellar infarct without significant mass effect. Associated mild petechial hemorrhage without frank hemorrhagic transformation. 2. Otherwise normal brain MRI.  TTE 07/26/2017 Study Conclusions - Left ventricle: The cavity size was normal. Wall thickness was increased in a pattern of mild LVH. Systolic function was normal. The estimated ejection fraction was in the range of 55% to 60%. Wall motion was normal; there were no regional wall motion abnormalities. Features are consistent with a pseudonormal left ventricular filling pattern, with concomitant abnormal relaxation and increased filling pressure (grade 2 diastolic dysfunction). - Aortic valve: There was mild regurgitation. - Mitral valve: There was mild regurgitation. - Right ventricle: The cavity size was mildly dilated. - Pulmonary arteries: Systolic pressure was mildly increased. PA peak pressure: 39 mm Hg (S). Impressions: - Normal LV systolic function; moderate diastolic dysfunction; mild LVH; mild AI; mild MR; mild RVE; mild TR with mildly elevated pulmonary pressure.  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  Loop recorder placed at that time which has shown evidence of atrial fibrillation and therefore was started on Eliquis.  Vascular risk factors include atrial fibrillation, DM, HLD and HTN.  Family request for today's visit in regards to worsening fluctuation of his stroke deficits likely due to mix of deconditioning and psychological with increased stress, depression and anxiety with wife of 75 years passing away in August from cancer.   He also continues to have occasional dizziness/lightheaded symptoms with evaluation by cardiology felt related to orthostatic hypotension    Plan:  -Continue Eliquis (apixaban) daily  and lipitor  for secondary stroke prevention -Referral placed to PT to assist with worsening fluctuation of gait and balance.  Discussion with patient and son regarding low likelihood of new stroke as he has not had any new or different stroke/TIA symptoms from prior symptoms.  Agreed to hold off on any additional imaging at this time unless new or worsening symptoms occur -Encouraged increasing activity at home to prevent deconditioning -Discussion regarding increased stress with depression and anxiety and possible referral to psychiatry/psychology and initiation of antidepressant.  Patient would like to hold off on medication management at this time -PCP for management of cholesterol, blood pressure and diabetes  -Cardiologist for atrial fibrillation and Eliquis management -Maintain strict control of hypertension with blood pressure goal below 130/90, diabetes with hemoglobin A1c goal below 6.5% and cholesterol with LDL cholesterol (bad cholesterol) goal below 70 mg/dL. I also advised the patient to eat a healthy diet with plenty of whole grains, cereals, fruits and vegetables, exercise regularly and maintain ideal body weight.  Followup in the future with me in 3 months or will call earlier if needed  Greater than 50% time during this 25 minute visit was spent on discussing fluctuation of known stroke deficits and possible cause, counseling and coordination of care about DM, atrial fibrillation, HLD and HTN (risk factors), and answered all questions to patient and son satisfaction  Frann Rider, [facility]  [facility] [address] [place], [place] [address]  Phone [phone] Fax [phone]

## 2019-10-31 NOTE — Progress Notes (Signed)
I agree with the above plan 

## 2019-11-12 ENCOUNTER — Encounter: Payer: Self-pay | Admitting: Physical Medicine & Rehabilitation

## 2019-11-12 ENCOUNTER — Other Ambulatory Visit: Payer: Self-pay

## 2019-11-12 ENCOUNTER — Encounter: Payer: Medicare Other | Attending: Physical Medicine & Rehabilitation | Admitting: Physical Medicine & Rehabilitation

## 2019-11-12 VITALS — BP 149/78 | HR 100 | Temp 98.0°F | Ht 71.0 in | Wt 180.0 lb

## 2019-11-12 DIAGNOSIS — R42 Dizziness and giddiness: Secondary | ICD-10-CM | POA: Diagnosis not present

## 2019-11-12 DIAGNOSIS — I951 Orthostatic hypotension: Secondary | ICD-10-CM | POA: Diagnosis not present

## 2019-11-12 DIAGNOSIS — R269 Unspecified abnormalities of gait and mobility: Secondary | ICD-10-CM | POA: Diagnosis not present

## 2019-11-12 DIAGNOSIS — I6930 Unspecified sequelae of cerebral infarction: Secondary | ICD-10-CM

## 2019-11-12 NOTE — Progress Notes (Addendum)
Subjective:    Patient ID: Earl [name], male    DOB: [birth year], 78 y.o.   MRN: [id number]   HPI Male with history of  HTN, questionable history PAF, T2DM presents for follow up for cerebellar stroke.   Last clinic visit 05/09/2019.  Since that time, pt went to the ED for phimosis and had circumcision since that time. Pt states he has not been doing HEP.  Denies falls. His wife passed away in 08/14/19 and he has had difficulty adjusting.  Denies Orthostasis. He notes still not back to baseline when bends over for a long period and then stands up.   Pain Inventory Average Pain 0 Pain Right Now 0 My pain is no pain  In the last 24 hours, has pain interfered with the following? General activity 0 Relation with others 0 Enjoyment of life 0 What TIME of day is your pain at its worst? no pain Sleep (in general) Fair  Pain is worse with: no pain Pain improves with: no pain Relief from Meds: no pain  Mobility walk with assistance use a cane ability to climb steps?  yes do you drive?  yes  Function retired  Neuro/Psych trouble walking dizziness  Prior Studies Any changes since last visit?  yes CT/MRI  Physicians involved in your care Any changes since last visit?  no   Family History  Problem Relation Age of Onset  . Sickle cell anemia Mother   . Diabetes Father   . Stomach cancer Father   . Colon cancer Father   . CAD Neg Hx   . Esophageal cancer Neg Hx   . Rectal cancer Neg Hx    Social History   Socioeconomic History  . Marital status: Married    Spouse name: Not on file  . Number of children: 2  . Years of education: Not on file  . Highest education level: Not on file  Occupational History  . Occupation: Herbalist: RETIRED  Tobacco Use  . Smoking status: Never Smoker  . Smokeless tobacco: Never Used  Substance and Sexual Activity  . Alcohol use: No  . Drug use: No  . Sexual activity: Not on file  Other  Topics Concern  . Not on file  Social History Narrative  . Not on file   Social Determinants of Health   Financial Resource Strain:   . Difficulty of Paying Living Expenses: Not on file  Food Insecurity:   . Worried About Charity fundraiser in the Last Year: Not on file  . Ran Out of Food in the Last Year: Not on file  Transportation Needs:   . Lack of Transportation (Medical): Not on file  . Lack of Transportation (Non-Medical): Not on file  Physical Activity:   . Days of Exercise per Week: Not on file  . Minutes of Exercise per Session: Not on file  Stress:   . Feeling of Stress : Not on file  Social Connections:   . Frequency of Communication with Friends and Family: Not on file  . Frequency of Social Gatherings with Friends and Family: Not on file  . Attends Religious Services: Not on file  . Active Member of Clubs or Organizations: Not on file  . Attends Archivist Meetings: Not on file  . Marital Status: Not on file   Past Surgical History:  Procedure Laterality Date  . CIRCUMCISION N/A 06/11/2019   Procedure: CIRCUMCISION ADULT;  Surgeon: [name], MD;  Location: [facility];  Service: Urology;  Laterality: N/A;  . COLONOSCOPY    . INGUINAL HERNIA REPAIR Bilateral    1968 and 2004  . LOOP RECORDER INSERTION N/A 07/28/2017   Procedure: LOOP RECORDER INSERTION;  Surgeon: Deboraha Sprang, MD;  Location: [facility];  Service: Cardiovascular;  Laterality: N/A;  . TEE WITHOUT CARDIOVERSION N/A 07/28/2017   Procedure: TRANSESOPHAGEAL ECHOCARDIOGRAM (TEE);  Surgeon: Sueanne Margarita, MD;  Location: [facility];  Service: Cardiovascular;  Laterality: N/A;   Past Medical History:  Diagnosis Date  . Adenomatous colon polyp   . Allergy   . Anal fissure   . Anxiety   . Arthritis   . BPH (benign prostatic hyperplasia)   . Cataract   . Diabetes mellitus ([place])   . GERD (gastroesophageal reflux disease)   . Hyperlipidemia   . Hypertension   .  Hypothyroidism   . Other testicular hypofunction   . Stroke ([place])   . Type II or unspecified type diabetes mellitus without mention of complication, not stated as uncontrolled    BP (!) 149/78   Pulse 100   Temp 98 F (36.7 C)   Ht 5\' 11"  (1.803 m)   Wt 180 lb (81.6 kg)   SpO2 98%   BMI 25.10 kg/m   Opioid Risk Score:   Fall Risk Score:  `1  Depression screen PHQ 2/9  Depression screen [facility] 2/9 06/08/2019 03/05/2019 01/22/2019 11/26/2018 05/13/2018 04/05/2018 12/21/2017  Decreased Interest 0 0 0 0 0 0 0  Down, Depressed, Hopeless 0 0 0 0 0 0 0  PHQ - 2 Score 0 0 0 0 0 0 0  Altered sleeping - - - - - - -  Tired, decreased energy - - - - - - -  Change in appetite - - - - - - -  Feeling bad or failure about yourself  - - - - - - -  Trouble concentrating - - - - - - -  Moving slowly or fidgety/restless - - - - - - -  Suicidal thoughts - - - - - - -  PHQ-9 Score - - - - - - -  Difficult doing work/chores - - - - - - -  Some recent data might be hidden    Review of Systems  Constitutional: Negative.   HENT: Negative.   Eyes: Negative.   Respiratory: Negative.   Cardiovascular: Negative.   Gastrointestinal: Negative.   Endocrine: Negative.   Genitourinary: Negative.   Musculoskeletal: Positive for gait problem.  Skin: Negative.   Allergic/Immunologic: Negative.   Neurological:       Dizziness when standing from a sitting position    Hematological: Negative.   Psychiatric/Behavioral: Negative.       Objective:   Physical Exam  Constitutional: No distress . Vital signs reviewed. HENT: Normocephalic.  Atraumatic. Eyes: EOMI. No discharge. Cardiovascular: No JVD. Respiratory: Normal effort.  No stridor. GI: Non-distended. Skin: Warm and dry.  Intact. Psych: [place] Musc: No edema in extremities.  No tenderness in extremities. Neurological: Alert Speech clear  Motor: Grossly 5/5 throughout  Assessment & Plan:  Male with history of  HTN, questionable history PAF, T2DM  presents for follow up for cerebellar stroke.   1.  Functional and mobility deficits secondary to right cerebellar infarct  Encouraged HEP, referral to therapies made  Cont follow up with Neurology  Encouraged slow positional changes.   2. Gait abnormality  HEP, encouraged again  Cont cane for safety  3. Orthostasis - improved  Cont slow positional changed  Cont appropriate hydration  Encouraged TEDS if necessary  Cont follow Cards  Patient would like to follow up in 6 months  >25 minutes spent, with >20 minutes in counseling regarding vestibular symptoms, balance, and coping with loss

## 2019-11-12 NOTE — Addendum Note (Signed)
Addended by: Delice Lesch A on: 11/12/2019 10:36 AM   Modules accepted: Orders, Level of Service

## 2019-11-13 NOTE — Progress Notes (Signed)
Subjective:    Patient ID: Earl [name], male    DOB: 03-Jun-1941, 78 y.o.   MRN: [id number]  HPI 78 y.o. non smoking, recently widowed AAM with history of DM, history of thyroid goiter, history of CVA presents with pills getting caught x Sept. Daughter is with him, Earl [name]. He states swallowing is getting harder, metformin is difficult. Daughter notices he is coughing after eating/drinking more, patient denies any food or drink getting caught or choking. He will occ have GERD, he is not on medication for this.  Had EGD with Dr. Havery Moros 05/21/2019 with gastritis.  His daughter thinks his neck looks larger.   He has new dentures.  He is NO LONGER on ACE- removed from list, not causing cough.    He is down 10-12 lbs down from June, he is not trying to lose weight. Appetite has been fine. He has night sweats on and off, states has had for 2 months before his wife died 6 months ago. He is still grieving for his wife's passing and may have some weight loss since that time. He is talking to hospice grief counseling. He is sleeping most nights, has a very support family.   EGD 04/2019 Colonoscopy 04/2019 CT AB 2016- was normal other than slightly thickened wall of bladder CXR 12/2009- may need repeat History of MGUS with bone scan normal 2003 He has urinary frequency, this is unchanged for him.  Wt Readings from Last 20 Encounters:  11/18/19 178 lb (80.7 kg)  11/12/19 180 lb (81.6 kg)  10/30/19 179 lb (81.2 kg)  09/10/19 175 lb 9.6 oz (79.7 kg)  09/06/19 171 lb 9.6 oz (77.8 kg)  06/11/19 178 lb 11.2 oz (81.1 kg)  06/07/19 178 lb 11.2 oz (81.1 kg)  06/06/19 180 lb 6.4 oz (81.8 kg)  05/21/19 182 lb (82.6 kg)  05/17/19 190 lb (86.2 kg)  05/09/19 186 lb (84.4 kg)  03/28/19 187 lb (84.8 kg)  03/05/19 187 lb (84.8 kg)  01/22/19 187 lb 3.2 oz (84.9 kg)  12/21/18 187 lb (84.8 kg)  12/19/18 183 lb 6.4 oz (83.2 kg)  11/27/18 187 lb 12.8 oz (85.2 kg)  11/26/18 191 lb (86.6 kg)  10/24/18  184 lb (83.5 kg)  09/04/18 184 lb 6.4 oz (83.6 kg)   He is on a B12 supplement.  Lab Results  Component Value Date   VITAMINB12 1,379 (H) 11/18/2019    Had EGD with Dr. Havery Moros 05/21/2019 Esophagogastric landmarks identified. - Z-line irregular, 40 cm from the incisors. Biopsied. - LA Grade B reflux esophagitis. - Normal stomach. Biopsied. - Normal duodenal bulb and second portion of the duodenum.  Thyroid nodule [place] 2014 and CT soft tissue neck 2014 IMPRESSION: Dominant left lobe of thyroid mass. Fine-needle aspiration recommended to help evaluate for possibility malignancy.  Extending from the superior aspect of the left lobe of thyroid gland superiorly is an ill-defined 4.2 x 1.5 x 4.4 cm lesion.  This may represent a fatty lesion.  For further delineation, MR or CT recommended. CT head 11/2018 CT head (without) demonstrating: - Chronic right superior cerebellar ischemic infarction.  Blood pressure 130/74, pulse 100, temperature (!) 97.3 F (36.3 C), weight 178 lb (80.7 kg), SpO2 99 %.  Medications  Current Outpatient Medications (Endocrine & Metabolic):  .  glipiZIDE (GLUCOTROL) 5 MG tablet, Take 1 tablet 2 x /day with Meals for Diabetes .  levothyroxine (SYNTHROID) 50 MCG tablet, Take 1 tablet daily on an empty stomach with only water for 30 minutes &  no Antacid meds, Calcium or Magnesium for 4 hours & avoid Biotin .  metFORMIN (GLUCOPHAGE-XR) 500 MG 24 hr tablet, Take 2 tablets 2 x  /day with Meals for Diabetes  Current Outpatient Medications (Cardiovascular):  .  atorvastatin (LIPITOR) 40 MG tablet, TAKE 1 TABLET BY MOUTH  DAILY AT 6 PM (Patient taking differently: Take 40 mg by mouth at bedtime. )    Current Outpatient Medications (Hematological):  .  apixaban (ELIQUIS) 5 MG TABS tablet, Take 1 tablet 2 x /day to Prevent Blood Clots .  Cyanocobalamin (B-12) 50 MCG TABS, Take 50 mcg by mouth daily.  Current Outpatient Medications (Other):  [name]  ALPRAZolam  (XANAX) 1 MG tablet, Take 1/2-1 tablet at Bedtime  ONLY if needed for Sleep &  limit to 5 days /week to avoid addiction .  Blood Glucose Monitoring Suppl (ONE TOUCH ULTRA 2) w/Device KIT, Check blood sugar 1 time a day-DX-E11.9 .  Cholecalciferol (VITAMIN D PO), Take 1 tablet by mouth daily.  .  finasteride (PROSCAR) 5 MG tablet, Take 1 tablet Daily for Prostate .  glucose blood (ONETOUCH ULTRA) test strip, Check blood sugar 1 time a day-DX-E11.9 .  hydrocortisone (ANUSOL-HC) 2.5 % rectal cream, Apply rectally  2  to 3 x /daily (Patient taking differently: Place 1 application rectally every 8 (eight) hours as needed for anal itching. ) .  Lancets Misc. (ONE TOUCH SURESOFT) MISC, Check blood sugar 1 time a day-DX-E11.91 .  Magnesium 250 MG TABS, Take 250 mg by mouth daily.  .  Multiple Vitamin (MULTIVITAMIN) capsule, Take 1 capsule by mouth daily.   .  vitamin C (ASCORBIC ACID) 500 MG tablet, Take 500 mg by mouth daily.   [name]  omeprazole (PRILOSEC) 40 MG capsule, Take 1 capsule (40 mg total) by mouth daily.  Problem list He has Paroxysmal atrial fibrillation ([place]); MGUS (monoclonal gammopathy of unknown significance); Essential hypertension; Hyperlipidemia associated with type 2 diabetes mellitus ([place]); Type 2 diabetes mellitus ([facility]); BPH (benign prostatic hyperplasia); Medication management; Vitamin D deficiency; Medicare annual wellness visit, subsequent; Hypothyroidism; History of CVA with residual deficit; Hypoalbuminemia due to protein-calorie malnutrition ([place]); Ataxia, post-stroke; Labile blood glucose; Chronic anticoagulation; CKD stage 2 due to type 2 diabetes mellitus ([place]); Late effect of cerebrovascular accident (CVA); Acquired thrombophilia ([place]); Abdominal aortic atherosclerosis ([place]); and Neurologic gait disorder on their problem list.   Review of Systems  Constitutional: Positive for diaphoresis and unexpected weight change. Negative for activity change, appetite change, chills, fatigue  and fever.  HENT: Negative.   Eyes: Negative.   Respiratory: Negative.   Cardiovascular: Negative.   Gastrointestinal: Positive for abdominal pain. Negative for abdominal distention, anal bleeding, blood in stool, constipation, diarrhea, nausea, rectal pain and vomiting.       Dysphagia  Genitourinary: Positive for frequency. Negative for difficulty urinating, discharge, dysuria, enuresis, flank pain, genital sores, hematuria, penile pain, scrotal swelling and testicular pain.  Musculoskeletal: Positive for arthralgias, back pain and gait problem. Negative for joint swelling, myalgias, neck pain and neck stiffness.  Skin: Negative.  Negative for rash.  Neurological: Positive for numbness (bilateral feet due to DM).  Hematological: Negative.   Psychiatric/Behavioral: Positive for dysphoric mood. Negative for suicidal ideas.       Objective:   Physical Exam Constitutional:      General: He is not in acute distress.    Appearance: Normal appearance.  HENT:     Right Ear: Tympanic membrane, ear canal and external ear normal.     Left  Ear: Tympanic membrane, ear canal and external ear normal.     Mouth/Throat:     Mouth: Mucous membranes are moist.     Pharynx: No oropharyngeal exudate or posterior oropharyngeal erythema.  Eyes:     Pupils: Pupils are equal, round, and reactive to light.  Neck:     Thyroid: Thyromegaly present. No thyroid tenderness.  Cardiovascular:     Rate and Rhythm: Normal rate. Rhythm irregularly irregular.     Pulses: Normal pulses.  Pulmonary:     Effort: Pulmonary effort is normal.     Breath sounds: Normal breath sounds. No decreased breath sounds, wheezing, rhonchi or rales.  Abdominal:     General: Bowel sounds are decreased.     Palpations: Abdomen is soft. There is no shifting dullness, fluid wave or mass.     Tenderness: There is abdominal tenderness in the right upper quadrant and epigastric area. There is no right CVA tenderness, left CVA  tenderness, guarding or rebound. Negative signs include Murphy's sign.  Musculoskeletal:     Cervical back: Neck supple. No tenderness.     Right lower leg: 1+ Pitting Edema present.     Left lower leg: 1+ Pitting Edema present.  Lymphadenopathy:     Cervical: No cervical adenopathy.  Skin:    General: Skin is warm.     Findings: No rash.  Neurological:     General: No focal deficit present.     Mental Status: He is alert.     Gait: Gait abnormal (antalgic with cane).  Psychiatric:        Thought Content: Thought content normal.        Judgment: Judgment normal.     Comments: Patient appropriately tearful while discussing his wife's passing        Assessment & Plan:   Dysphagia, unspecified type ? Contributing to weight loss versus depression versus malignancy.  Had recent normal EGD, will do trial back on Prilosec to see if this helps- if not may refer back to GI Will do speech eval with history of CVA though patient has no issues with liquids/food but has some coughing with food only Will check further labs, may need CXR/chest/AB imaging.  Declines medications for depression at this time, will continue follow up with hospice counseling, has very supportive family.  Has close follow up 1 month with Dr. Melford Aase.  -     CBC with Diff -     COMPLETE METABOLIC PANEL WITH GFR -     TSH -     Ambulatory referral to Speech Therapy -     omeprazole (PRILOSEC) 40 MG capsule; Take 1 capsule (40 mg total) by mouth daily.  History of CVA (cerebrovascular accident) -     Ambulatory referral to Speech Therapy  B12 deficiency -     Vitamin B12  The patient was advised to call immediately if he has any concerning symptoms in the interval. The patient voices understanding of current treatment options and is in agreement with the current care plan.The patient knows to call the clinic with any problems, questions or concerns or go to the ER if any further progression of symptoms.     Future Appointments  Date Time Provider [place]  11/21/2019  8:40 AM [facility] DEVICE REMOTES [facility]  12/13/2019  8:00 AM [facility] LAB 6 [facility] None  12/16/2019  9:30 AM Baldwin [place], PA-C [facility]  12/16/2019 11:30 AM [name], MD [facility] None  12/20/2019  1:30  PM Wyatt Portela, MD [facility] None  12/23/2019  8:40 AM [facility] DEVICE REMOTES [facility]  01/23/2020  8:40 AM [facility] DEVICE REMOTES [facility]  01/29/2020 10:15 AM Darden Dates, Janett Billow, NP [facility] None  02/24/2020  9:15 AM [facility] DEVICE REMOTES [facility]  03/26/2020  8:40 AM [facility] DEVICE REMOTES [facility]  03/31/2020 11:15 AM McClanahan, Danton Sewer, NP [facility] None  04/27/2020  8:40 AM [facility] DEVICE REMOTES [facility]  05/12/2020 10:20 AM Jamse Arn, MD [facility]  05/28/2020  8:40 AM [facility] DEVICE REMOTES [facility]  06/29/2020  9:05 AM [facility] DEVICE REMOTES [facility]  07/13/2020 10:00 AM [name], MD [facility] None  07/30/2020  8:40 AM [facility] DEVICE REMOTES [facility]  08/31/2020  8:40 AM [facility] DEVICE REMOTES [facility]  10/01/2020  8:50 AM [facility] DEVICE REMOTES [facility]  11/02/2020  8:40 AM [facility] DEVICE REMOTES [facility]

## 2019-11-13 NOTE — Progress Notes (Signed)
ILR remote 

## 2019-11-18 ENCOUNTER — Encounter: Payer: Self-pay | Admitting: Physician Assistant

## 2019-11-18 ENCOUNTER — Other Ambulatory Visit: Payer: Self-pay

## 2019-11-18 ENCOUNTER — Ambulatory Visit (INDEPENDENT_AMBULATORY_CARE_PROVIDER_SITE_OTHER): Payer: Medicare Other | Admitting: Physician Assistant

## 2019-11-18 VITALS — BP 130/74 | HR 100 | Temp 97.3°F | Wt 178.0 lb

## 2019-11-18 DIAGNOSIS — R634 Abnormal weight loss: Secondary | ICD-10-CM

## 2019-11-18 DIAGNOSIS — Z8673 Personal history of transient ischemic attack (TIA), and cerebral infarction without residual deficits: Secondary | ICD-10-CM | POA: Diagnosis not present

## 2019-11-18 DIAGNOSIS — R1310 Dysphagia, unspecified: Secondary | ICD-10-CM | POA: Diagnosis not present

## 2019-11-18 DIAGNOSIS — E538 Deficiency of other specified B group vitamins: Secondary | ICD-10-CM

## 2019-11-18 DIAGNOSIS — F4321 Adjustment disorder with depressed mood: Secondary | ICD-10-CM

## 2019-11-18 MED ORDER — OMEPRAZOLE 40 MG PO CPDR: 40.0000 mg | DELAYED_RELEASE_CAPSULE | Freq: Every day | ORAL | 1 refills | Status: DC

## 2019-11-18 NOTE — Patient Instructions (Addendum)
Check meds at home and make sure you are not on lisinopril, can cause a cough  Will do speech therapy  Start on omeprazole 40 mg once a day  Can take the myrbetriq 25mg  and can take 2 pills or 50 mg if this helps Try to not drink any fluids 2-3 hours before bed No caffiene after 12  Silent reflux: Not all heartburn burns...Marland KitchenMarland KitchenMarland Kitchen  What is LPR? Laryngopharyngeal reflux (LPR) or silent reflux is a condition in which acid that is made in the stomach travels up the esophagus (swallowing tube) and gets to the throat. Not everyone with reflux has a lot of heartburn or indigestion. In fact, many people with LPR never have heartburn. This is why LPR is called SILENT REFLUX, and the terms "Silent reflux" and "LPR" are often used interchangeably. Because LPR is silent, it is sometimes difficult to diagnose.  How can you tell if you have LPR?  [name] Chronic hoarseness- Some people have hoarseness that comes and goes . throat clearing  . Cough . It can cause shortness of breath and cause asthma like symptoms. [name] a feeling of a lump in the throat  . difficulty swallowing . a problem with too much nose and throat drainage.  . Some people will feel their esophagus spasm which feels like their heart beating hard and fast, this will usually be after a meal, at rest, or lying down at night.    How do I treat this? Treatment for LPR should be individualized, and your doctor will suggest the best treatment for you. Generally there are several treatments for LPR: . changing habits and diet to reduce reflux,  . medications to reduce stomach acid, and  . surgery to prevent reflux. Most people with LPR need to modify how and when they eat, as well as take some medication, to get well. Sometimes, nonprescription liquid antacids, such as Maalox, Gelucil and Mylanta are recommended. When used, these antacids should be taken four times each day - one tablespoon one hour after each meal and before bedtime. Dietary and  lifestyle changes alone are not often enough to control LPR - medications that reduce stomach acid are also usually needed. These must be prescribed by our doctor.   TIPS FOR REDUCING REFLUX AND LPR Control your LIFE-STYLE and your DIET! [name] If you use tobacco, QUIT.  [name] Smoking makes you reflux. After every cigarette you have some LPR.  . Don't wear clothing that is too tight, especially around the waist (trousers, corsets, belts).  . Do not lie down just after eating...in fact, do not eat within three hours of bedtime.  . You should be on a low-fat diet.  . Limit your intake of red meat.  . Limit your intake of butter.  [name] Avoid fried foods.  . Avoid chocolate  . Avoid cheese.  [name] Avoid eggs. [name] Specifically avoid caffeine (especially coffee and tea), soda pop (especially cola) and mints.  . Avoid alcoholic beverages, particularly in the evening.  WEIGHT GAIN  Try to make sure you are eating plenty of high calorie dense foods like: Avocado Nuts Peanut butter Oatmeal Dates Cottage cheese Greek yogurt Protein powder

## 2019-11-19 ENCOUNTER — Telehealth: Payer: Self-pay

## 2019-11-19 LAB — COMPLETE METABOLIC PANEL WITH GFR
AG Ratio: 2.1 (calc) (ref 1.0–2.5)
ALT: 87 U/L — ABNORMAL HIGH (ref 9–46)
AST: 38 U/L — ABNORMAL HIGH (ref 10–35)
Albumin: 3.9 g/dL (ref 3.6–5.1)
Alkaline phosphatase (APISO): 67 U/L (ref 35–144)
BUN: 22 mg/dL (ref 7–25)
CO2: 23 mmol/L (ref 20–32)
Calcium: 9.1 mg/dL (ref 8.6–10.3)
Chloride: 108 mmol/L (ref 98–110)
Creat: 0.98 mg/dL (ref 0.70–1.18)
GFR, Est African American: 85 mL/min/{1.73_m2} (ref >=60)
GFR, Est Non African American: 74 mL/min/{1.73_m2} (ref >=60)
Globulin: 1.9 g/dL (calc) (ref 1.9–3.7)
Glucose, Bld: 120 mg/dL — ABNORMAL HIGH (ref 65–99)
Potassium: 4.3 mmol/L (ref 3.5–5.3)
Sodium: 141 mmol/L (ref 135–146)
Total Bilirubin: 0.5 mg/dL (ref 0.2–1.2)
Total Protein: 5.8 g/dL — ABNORMAL LOW (ref 6.1–8.1)

## 2019-11-19 LAB — CBC WITH DIFFERENTIAL/PLATELET
Absolute Monocytes: 541 cells/uL (ref 200–950)
Basophils Absolute: 42 cells/uL (ref 0–200)
Basophils Relative: 0.8 %
Eosinophils Absolute: 48 cells/uL (ref 15–500)
Eosinophils Relative: 0.9 %
HCT: 36.6 % — ABNORMAL LOW (ref 38.5–50.0)
Hemoglobin: 12.3 g/dL — ABNORMAL LOW (ref 13.2–17.1)
Lymphs Abs: 1627 cells/uL (ref 850–3900)
MCH: 28.7 pg (ref 27.0–33.0)
MCHC: 33.6 g/dL (ref 32.0–36.0)
MCV: 85.5 fL (ref 80.0–100.0)
MPV: 11.2 fL (ref 7.5–12.5)
Monocytes Relative: 10.2 %
Neutro Abs: 3042 cells/uL (ref 1500–7800)
Neutrophils Relative %: 57.4 %
Platelets: 178 10*3/uL (ref 140–400)
RBC: 4.28 10*6/uL (ref 4.20–5.80)
RDW: 13.1 % (ref 11.0–15.0)
Total Lymphocyte: 30.7 %
WBC: 5.3 10*3/uL (ref 3.8–10.8)

## 2019-11-19 LAB — VITAMIN B12: Vitamin B-12: 1379 pg/mL — ABNORMAL HIGH (ref 200–1100)

## 2019-11-19 LAB — CUP PACEART REMOTE DEVICE CHECK
Date Time Interrogation Session: 20201229120016
Implantable Pulse Generator Implant Date: 20180907

## 2019-11-19 LAB — TSH: TSH: 0.49 mIU/L (ref 0.40–4.50)

## 2019-11-19 NOTE — Telephone Encounter (Signed)
I conference call the pt and [employer] to trouble shoot the pt monitor. [employer] is sending the pt a new monitor. He will receive it after the new year.

## 2019-11-20 NOTE — Progress Notes (Signed)
ILR remote 

## 2019-11-21 ENCOUNTER — Ambulatory Visit (INDEPENDENT_AMBULATORY_CARE_PROVIDER_SITE_OTHER): Payer: Medicare Other | Admitting: *Deleted

## 2019-11-21 DIAGNOSIS — I6930 Unspecified sequelae of cerebral infarction: Secondary | ICD-10-CM | POA: Diagnosis not present

## 2019-11-25 ENCOUNTER — Other Ambulatory Visit: Payer: Self-pay | Admitting: Physician Assistant

## 2019-11-25 DIAGNOSIS — R1310 Dysphagia, unspecified: Secondary | ICD-10-CM

## 2019-11-25 DIAGNOSIS — Z8673 Personal history of transient ischemic attack (TIA), and cerebral infarction without residual deficits: Secondary | ICD-10-CM

## 2019-11-25 DIAGNOSIS — R634 Abnormal weight loss: Secondary | ICD-10-CM

## 2019-11-26 ENCOUNTER — Other Ambulatory Visit: Payer: Self-pay

## 2019-11-26 ENCOUNTER — Ambulatory Visit: Payer: Medicare Other | Attending: Adult Health

## 2019-11-26 ENCOUNTER — Other Ambulatory Visit (HOSPITAL_COMMUNITY): Payer: Self-pay | Admitting: *Deleted

## 2019-11-26 ENCOUNTER — Ambulatory Visit: Payer: Medicare Other

## 2019-11-26 VITALS — BP 120/78 | HR 106

## 2019-11-26 DIAGNOSIS — M6281 Muscle weakness (generalized): Secondary | ICD-10-CM | POA: Insufficient documentation

## 2019-11-26 DIAGNOSIS — R1310 Dysphagia, unspecified: Secondary | ICD-10-CM

## 2019-11-26 DIAGNOSIS — R2681 Unsteadiness on feet: Secondary | ICD-10-CM | POA: Diagnosis not present

## 2019-11-26 DIAGNOSIS — R2689 Other abnormalities of gait and mobility: Secondary | ICD-10-CM | POA: Diagnosis not present

## 2019-11-27 NOTE — Therapy (Addendum)
Earl [name] [address] [place], [place], [address] Phone: [phone]   Fax:  [phone]  Physical Therapy Evaluation  Patient Details  Name: Earl [name] MRN: [id number] Date of Birth: [birth year] Referring Provider (PT): Delice Lesch   Encounter Date: 11/26/2019  PT End of Session - 11/26/19 1447    Visit Number  1    Number of Visits  17 (corrected visit number error in addendum)   Date for PT Re-Evaluation  02/25/20   60 day poc but 90 day cert due to scheduling   Authorization Type  10th visit progress note    PT Start Time  1401    PT Stop Time  1447    PT Time Calculation (min)  46 min    Activity Tolerance  Patient tolerated treatment well    Behavior During Therapy  [facility] for tasks assessed/performed       Past Medical History:  Diagnosis Date  . Adenomatous colon polyp   . Allergy   . Anal fissure   . Anxiety   . Arthritis   . BPH (benign prostatic hyperplasia)   . Cataract   . Diabetes mellitus ([place])   . GERD (gastroesophageal reflux disease)   . Hyperlipidemia   . Hypertension   . Hypothyroidism   . Other testicular hypofunction   . Stroke ([place])   . Type II or unspecified type diabetes mellitus without mention of complication, not stated as uncontrolled     Past Surgical History:  Procedure Laterality Date  . CIRCUMCISION N/A 06/11/2019   Procedure: CIRCUMCISION ADULT;  Surgeon: [name], MD;  Location: [facility];  Service: Urology;  Laterality: N/A;  . COLONOSCOPY    . INGUINAL HERNIA REPAIR Bilateral    1968 and 2004  . LOOP RECORDER INSERTION N/A 07/28/2017   Procedure: LOOP RECORDER INSERTION;  Surgeon: Deboraha Sprang, MD;  Location: [place] [facility];  Service: Cardiovascular;  Laterality: N/A;  . TEE WITHOUT CARDIOVERSION N/A 07/28/2017   Procedure: TRANSESOPHAGEAL ECHOCARDIOGRAM (TEE);  Surgeon: Sueanne Margarita, MD;  Location: [facility] ENDOSCOPY;  Service: Cardiovascular;   Laterality: N/A;    Vitals:   11/26/19 1441  BP: 120/78  Pulse: (!) 106     Subjective Assessment - 11/26/19 1406    Subjective  Pt was referred from neurologist to get some exercises he can do at home and to work on his walking. Pt with history of cerebellar infarct in 2018. Pt's daughter present and reports that he is not clearing his feet as well. Pt has had therapy here in past. Uses SPC when out of house. No falls but some near misses. Pt's wife of 78 years passed a couple months ago and pt seems to be less active since then. Per MD note pt does ont want to try any meds for depression at this time. Doctors are having him drink ensure to get more protein.    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    Currently in Pain?  No/denies         [facility] PT Assessment - 11/26/19 1412      Assessment   Medical Diagnosis  late effect CVA, neurologic gait disorder    Referring Provider (PT)  Ankit Patel    Onset Date/Surgical Date  --   2018 CVA but increased weakness the  last couple days.    Hand Dominance  Right    Prior Therapy  Pt had all disciplines a year or so ago      Balance Screen   Has the patient fallen in the past 6 months  No    Has the patient had a decrease in activity level because of a fear of falling?   Yes    Is the patient reluctant to leave their home because of a fear of falling?   No      Home Environment   Living Environment  Private residence    Living Arrangements  Alone   children are coming in most days but live over an hour away.   Available Help at Discharge  Family   children coming all but like 2 days   Type of [place] to enter    Entrance Stairs-Number of Steps  3    Entrance Stairs-Rails  Right    Home Layout  One level   2 steps inside home   Home Equipment  Tub bench;Cane - single point   quad tip      Prior Function   Level of Independence  Independent with household mobility with device    Vocation  Retired    [employer]  used to work in Programmer, applications in [place]  used to like to get on roof and clean gutters but has not done in some time. Used to play a lot of sports. Pt does still drive. Would like to be able to do lawn again.      Observation/Other Assessments   Observations  Pt reports occasional trouble with swallowing pills. Daughter reports he does appear to be coughing more after.       Sensation   Light Touch  Appears Intact      Coordination   Gross Motor Movements are Fluid and Coordinated  Yes   Intact RAMs   Fine Motor Movements are Fluid and Coordinated  Yes   intact finger opposition     ROM / Strength   AROM / PROM / Strength  Strength      Strength   Overall Strength Comments  Pt grossly 4+/5 throughout LE and UE      Transfers   Transfers  Sit to Stand;Stand to Sit    Comments  30 sec sit to stand=6 without UE support      Ambulation/Gait   Ambulation/Gait  Yes    Ambulation/Gait Assistance  5: Supervision    Ambulation/Gait Assistance Details  Pt has decreased right foot clearance with mild eversion    Ambulation Distance (Feet)  100 Feet    Assistive device  Straight cane    Gait Pattern  Step-through pattern    Gait velocity  18.69 sec    Stairs  Yes    Stairs Assistance  5: Supervision    Stair Management Technique  One rail Right;Alternating pattern   cane   Number of Stairs  8      Standardized Balance Assessment   Standardized Balance Assessment  Berg Balance Test      Berg Balance Test   Sit to Stand  Able to stand without using hands and stabilize independently    Standing Unsupported  Able to stand safely 2 minutes    Sitting with Back Unsupported but Feet Supported on Floor or Stool  Able to sit safely and securely 2 minutes  Stand to Sit  Sits safely with minimal use of hands    Transfers  Able to  transfer safely, minor use of hands    Standing Unsupported with Eyes Closed  Able to stand 10 seconds safely    Standing Unsupported with Feet Together  Able to place feet together independently and stand 1 minute safely    From Standing, Reach Forward with Outstretched Arm  Can reach confidently >25 cm (10")    From Standing Position, Pick up Object from Floor  Able to pick up shoe safely and easily    From Standing Position, Turn to Look Behind Over each Shoulder  Turn sideways only but maintains balance    Turn 360 Degrees  Able to turn 360 degrees safely but slowly    Standing Unsupported, Alternately Place Feet on Step/Stool  Able to complete >2 steps/needs minimal assist    Standing Unsupported, One Foot in Front  Able to take small step independently and hold 30 seconds    Standing on One Leg  Tries to lift leg/unable to hold 3 seconds but remains standing independently    Total Score  44                Objective measurements completed on examination: See above findings.              PT Education - 11/26/19 1956    Education Details  Pt instructed in PT plan of care. Instructed to perform sit to stand 5 x 2       PT Short Term Goals - 11/27/19 0815      PT SHORT TERM GOAL #1   Title  Pt will be independent with HEP for improved balance, strength and gait.    Time  4    Period  Weeks    Status  New    Target Date  12/27/19      PT SHORT TERM GOAL #2   Title  Pt will increase 30 sec sit to stand from 6 to 8 or more for improved functional strength and mobility.    Baseline  6 on 11/26/2019    Time  4    Period  Weeks    Status  New    Target Date  12/27/19      PT SHORT TERM GOAL #3   Title  Pt will ambulate >500' on varied surfaces with SPC mod I for improved community mobility and activity tolerance.    Time  4    Period  Weeks    Status  New    Target Date  12/27/19        PT Long Term Goals - 11/27/19 0818      PT LONG TERM GOAL #1    Title  Pt will increase Berg Balance score from 44/56 to >47/56 for improved balance and decreased fall risk.    Baseline  44/56 on 11/26/2019    Time  8    Period  Weeks    Status  New    Target Date  01/25/20      PT LONG TERM GOAL #2   Title  Pt will increase gait speed from 0.49m/s to >0.62m/s for improved gait safety in the community.    Baseline  0.57m/s on 11/26/2019    Time  8    Period  Weeks    Status  New    Target Date  01/25/20      PT LONG TERM GOAL #3  Title  Pt will ambulate >1000' with SPC versus no device on all surfaces for improved gait safety.    Time  8    Period  Weeks    Status  New    Target Date  01/25/20             Plan - 11/27/19 0806    Clinical Impression Statement  Pt presents to PT due to decreased stability with gait last few months. History of CVA in 2018. Pt also struggling with loss of wife a couple months ago. Pt tested well with MMT 4+/5 grossly throughout. Pt does show some weakness with functional activities as evident by decreased score on 30 sec sit to stand of 6. Pt also ambulates with decreased right foot clearance. His gait speed is not at safe community ambulator speed being 0.33m/s. Berg score of 44/56 does indicate fall risk. Pt will benefit from skilled PT to address functional strength, balance and gait deficits.    Personal Factors and Comorbidities  Comorbidity 3+    Comorbidities  DM, HTN, afib with loop recorder    Examination-Activity Limitations  Locomotion Level    Examination-Participation Restrictions  Yard Work;Community Activity    Stability/Clinical Decision Making  Evolving/Moderate complexity    Clinical Decision Making  Moderate    Rehab Potential  Good    PT Frequency  2x / week   plus eval   PT Duration  8 weeks    PT Treatment/Interventions  ADLs/Self Care Home Management;Gait training;Stair training;Functional mobility training;Therapeutic activities;Therapeutic exercise;Patient/family education;Neuromuscular  re-education;Balance training;Manual techniques    PT Next Visit Plan  Next visit progress HEP with strengthening and balance. Started with sit to stand 5 x 2. Gait training working on improving righ hip flexion and DF.    Consulted and Agree with Plan of Care  Patient;Family member/caregiver    Family Member Consulted  daughter       Patient will benefit from skilled therapeutic intervention in order to improve the following deficits and impairments:  Abnormal gait, Decreased activity tolerance, Decreased balance, Decreased mobility, Decreased strength  Visit Diagnosis: Other abnormalities of gait and mobility  Muscle weakness (generalized)     Problem List Patient Active Problem List   Diagnosis Date Noted  . 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 Acquired thrombophilia ([place]) 09/09/2019  . Abdominal aortic atherosclerosis ([place]) 09/09/2019  . Late effect of cerebrovascular accident (CVA) 03/28/2019  . CKD stage 2 due to type 2 diabetes mellitus ([place]) 12/20/2017  . Chronic anticoagulation 09/04/2017  . Labile blood glucose   . Hypoalbuminemia due to protein-calorie malnutrition ([place])   . Ataxia, post-stroke   . History of CVA with residual deficit   . Hypothyroidism 09/13/2016  . Medicare annual wellness visit, subsequent 08/28/2015  . Vitamin D deficiency 04/29/2014  . Medication management 01/03/2014  . Type 2 diabetes mellitus ([place])   . BPH (benign prostatic hyperplasia)   . 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 Essential hypertension 11/07/2013  . Hyperlipidemia associated with type 2 diabetes mellitus ([place]) 11/07/2013  . Paroxysmal atrial fibrillation ([place]) 09/18/2012    Earl [name], PT, DPT, NCS 11/27/2019, 8:22 AM  [place] [address] [place], [place], [address] Phone: [phone]   Fax:  [phone]  Name: Earl [name] MRN: [id number] Date of Birth:  February 18, 1941

## 2019-12-04 ENCOUNTER — Ambulatory Visit: Payer: Medicare Other | Admitting: Physical Therapy

## 2019-12-05 ENCOUNTER — Ambulatory Visit (HOSPITAL_COMMUNITY)
Admission: RE | Admit: 2019-12-05 | Discharge: 2019-12-05 | Disposition: A | Discharge status: Home / Self Care | Payer: Medicare Other | Source: Ambulatory Visit | Attending: Physician Assistant | Admitting: Physician Assistant

## 2019-12-05 ENCOUNTER — Other Ambulatory Visit: Payer: Self-pay

## 2019-12-05 DIAGNOSIS — R634 Abnormal weight loss: Secondary | ICD-10-CM | POA: Insufficient documentation

## 2019-12-05 DIAGNOSIS — Z8673 Personal history of transient ischemic attack (TIA), and cerebral infarction without residual deficits: Secondary | ICD-10-CM

## 2019-12-05 DIAGNOSIS — R1310 Dysphagia, unspecified: Secondary | ICD-10-CM | POA: Diagnosis not present

## 2019-12-05 DIAGNOSIS — R05 Cough: Secondary | ICD-10-CM | POA: Diagnosis not present

## 2019-12-10 ENCOUNTER — Ambulatory Visit: Payer: Medicare Other | Admitting: Internal Medicine

## 2019-12-11 ENCOUNTER — Ambulatory Visit: Payer: Medicare Other | Admitting: Physical Therapy

## 2019-12-11 ENCOUNTER — Other Ambulatory Visit: Payer: Self-pay

## 2019-12-11 ENCOUNTER — Ambulatory Visit: Payer: Medicare Other | Attending: Internal Medicine

## 2019-12-11 DIAGNOSIS — R2681 Unsteadiness on feet: Secondary | ICD-10-CM | POA: Diagnosis not present

## 2019-12-11 DIAGNOSIS — M6281 Muscle weakness (generalized): Secondary | ICD-10-CM

## 2019-12-11 DIAGNOSIS — R2689 Other abnormalities of gait and mobility: Secondary | ICD-10-CM | POA: Diagnosis not present

## 2019-12-11 DIAGNOSIS — Z23 Encounter for immunization: Secondary | ICD-10-CM

## 2019-12-11 NOTE — Progress Notes (Signed)
   Covid-19 Vaccination Clinic  Name:  Earl [name]    MRN: [id number] DOB: Apr 20, 1941  12/11/2019  Mr. [name] was observed post Covid-19 immunization for 15 minutes without incidence. He was provided with Vaccine Information Sheet and instruction to access the V-Safe system.   Mr. Palms was instructed to call 911 with any severe reactions post vaccine: [name] Difficulty breathing  . Swelling of your face and throat  . A fast heartbeat  . A bad rash all over your body  . Dizziness and weakness    Immunizations Administered    Name Date Dose VIS Date Route   Pfizer COVID-19 Vaccine 12/11/2019 12:03 PM 0.3 mL 11/01/2019 Intramuscular   Manufacturer: [place]   Lot: [id number]   [place]: [id number]

## 2019-12-11 NOTE — Therapy (Signed)
[facility] [address] [place], [place], [address] Phone: [phone]   Fax:  [phone]  Physical Therapy Treatment  Patient Details  Name: Earl [name] MRN: [id number] Date of Birth: 01-Oct-1941 Referring Provider (PT): Delice Lesch   Encounter Date: 12/11/2019  PT End of Session - 12/11/19 1629    Visit Number  2    Number of Visits  17    Date for PT Re-Evaluation  02/25/20   60 day poc but 90 day cert due to scheduling   Authorization Type  10th visit progress note    PT Start Time  1444    PT Stop Time  1532    PT Time Calculation (min)  48 min    Activity Tolerance  Patient tolerated treatment well    Behavior During Therapy  [facility] for tasks assessed/performed       Past Medical History:  Diagnosis Date  . Adenomatous colon polyp   . Allergy   . Anal fissure   . Anxiety   . Arthritis   . BPH (benign prostatic hyperplasia)   . Cataract   . Diabetes mellitus ([place])   . GERD (gastroesophageal reflux disease)   . Hyperlipidemia   . Hypertension   . Hypothyroidism   . Other testicular hypofunction   . Stroke ([place])   . Type II or unspecified type diabetes mellitus without mention of complication, not stated as uncontrolled     Past Surgical History:  Procedure Laterality Date  . CIRCUMCISION N/A 06/11/2019   Procedure: CIRCUMCISION ADULT;  Surgeon: [name], MD;  [facility];  Service: Urology;  Laterality: N/A;  . COLONOSCOPY    . INGUINAL HERNIA REPAIR Bilateral    1968 and 2004  . LOOP RECORDER INSERTION N/A 07/28/2017   Procedure: LOOP RECORDER INSERTION;  Surgeon: Deboraha Sprang, MD;  Location: [place] [facility];  Service: Cardiovascular;  Laterality: N/A;  . TEE WITHOUT CARDIOVERSION N/A 07/28/2017   Procedure: TRANSESOPHAGEAL ECHOCARDIOGRAM (TEE);  Surgeon: Sueanne Margarita, MD;  Location: [facility] ENDOSCOPY;  Service: Cardiovascular;  Laterality: N/A;    There were no vitals filed  for this visit.  Subjective Assessment - 12/11/19 1448    Subjective  Having more weakness over the past several months (since August). Daughter reports getting up more stable and then walking on unlevel surfaces.    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    Currently in Pain?  Yes    Pain Score  7     Pain Location  Toe (Comment which one)    Pain Orientation  Right;Left    Pain Descriptors / Indicators  Aching    Pain Type  Chronic pain    Pain Onset  More than a month ago    Aggravating Factors   walking (toes go farther in the shoe) or lying in the bed with the sheets    Pain Relieving Factors  cream alleviates                       [facility] Adult PT Treatment/Exercise - 12/11/19 0001      Transfers   Transfers  Sit to Stand;Stand to Sit    Sit to Stand  5: Supervision;Without upper extremity assist;From bed    Stand to Sit  5: Supervision;Without upper extremity  assist;To bed    Number of Reps  1 set;Other reps (comment)   5 reps    Transfer Cueing  Cues for foot placement, increased forward lean to stand      Ambulation/Gait   Ambulation/Gait  Yes    Ambulation/Gait Assistance  4: Min guard    Ambulation/Gait Assistance Details  With no cane in gym area between activities, PT provides cues at shoulders for relaxed reciprocal arm swing; PT provides verbal cues for increased step length, increased R heel strike    Ambulation Distance (Feet)  20 Feet   x 2; 40 ft x 2   Assistive device  Straight cane   Comes in with SPC; no device in session   Gait Pattern  Step-through pattern;Decreased arm swing - right;Decreased step length - right;Decreased stance time - right;Decreased dorsiflexion - right    Gait Comments  Advised pt to continue to use cane at home, due to decreased timing of RLE step through/heelstrike and decreased step length.       Exercises   Exercises  Knee/Hip;Ankle      Knee/Hip Exercises: Stretches   Active Hamstring Stretch  Right;Left;2 reps;30 seconds   SEated edge of mat; foot propped on floor   Gastroc Stretch  Right;1 rep;30 seconds    Gastroc Stretch Limitations  Foot propped at 4" cabinet shelf    Other Knee/Hip Stretches  Standing runner's stretch, for gastroc, 1 rep RLE x 15 sec      Knee/Hip Exercises: Aerobic   Stepper  Seated SciFit Stepper, Level 1.4, 4 extremities x 5 minutes, for flexibility and strengthening BLEs      Knee/Hip Exercises: Standing   Hip Abduction  Stengthening;Right;Left;1 set;10 reps;Knee straight    Functional Squat  2 sets;5 reps   At counter for support   Other Standing Knee Exercises  Stagger stance forward/back rocking x 10 reps each foot position, for unweighted ankle dorsiflexion and improved weightshifting through hips.      Knee/Hip Exercises: Seated   Long Arc Quad  Strengthening;Right;Left;1 set;10 reps   Green theraband   Heel Slides  Strengthening;1 set;10 reps   Green theraband   Marching  Strengthening;2 sets;10 reps;Both   Green theraband     Ankle Exercises: Seated   Toe Raise  10 reps   2 sets; no resistance then green theraband            PT Education - 12/11/19 1629    Education Details  Initial HEP-see instructions    Person(s) Educated  Patient;Child(ren)   daughter   Methods  Explanation;Demonstration;Handout    Comprehension  Verbalized understanding;Returned demonstration       PT Short Term Goals - 11/27/19 0815      PT SHORT TERM GOAL #1   Title  Pt will be independent with HEP for improved balance, strength and gait.    Time  4    Period  Weeks    Status  New    Target Date  12/27/19      PT SHORT TERM GOAL #2   Title  Pt will increase 30 sec sit to stand from 6 to 8 or more for improved functional strength and mobility.    Baseline  6 on 11/26/2019    Time  4    Period  Weeks    Status  New    Target Date  12/27/19       PT SHORT TERM GOAL #3   Title  Pt will ambulate >500'  on varied surfaces with SPC mod I for improved community mobility and activity tolerance.    Time  4    Period  Weeks    Status  New    Target Date  12/27/19        PT Long Term Goals - 11/27/19 0818      PT LONG TERM GOAL #1   Title  Pt will increase Berg Balance score from 44/56 to >47/56 for improved balance and decreased fall risk.    Baseline  44/56 on 11/26/2019    Time  8    Period  Weeks    Status  New    Target Date  01/25/20      PT LONG TERM GOAL #2   Title  Pt will increase gait speed from 0.42m/s to >0.107m/s for improved gait safety in the community.    Baseline  0.38m/s on 11/26/2019    Time  8    Period  Weeks    Status  New    Target Date  01/25/20      PT LONG TERM GOAL #3   Title  Pt will ambulate >1000' with SPC versus no device on all surfaces for improved gait safety.    Time  8    Period  Weeks    Status  New    Target Date  01/25/20            Plan - 12/11/19 1630    Clinical Impression Statement  Initiated HEP for improved lower extremity strength, flexibility.  Also worked on standing balance exercises for improved SLS and functional strength.  Pt really wants to not use cane; when not using cane in PT session, pt demo decreased timing and coordination of RLE, with PT providing facilitation to improve gait pattern.  PT does recommend to pt to use cane for gait at home for improved safety and technique as we continue to work in PT sessions.    Personal Factors and Comorbidities  Comorbidity 3+    Comorbidities  DM, HTN, afib with loop recorder    Examination-Activity Limitations  Locomotion Level    Examination-Participation Restrictions  Yard Work;Community Activity    Stability/Clinical Decision Making  Evolving/Moderate complexity    Rehab Potential  Good    PT Frequency  2x / week   plus eval   PT Duration  8 weeks    PT Treatment/Interventions  ADLs/Self Care Home Management;Gait  training;Stair training;Functional mobility training;Therapeutic activities;Therapeutic exercise;Patient/family education;Neuromuscular re-education;Balance training;Manual techniques    PT Next Visit Plan  Review stretches added to HEP today and conitnue to add to HEP for strength and balance.  Gait training with improved RLE step length and RLE stance time.    Consulted and Agree with Plan of Care  Patient;Family member/caregiver    Family Member Consulted  daughter       Patient will benefit from skilled therapeutic intervention in order to improve the following deficits and impairments:  Abnormal gait, Decreased activity tolerance, Decreased balance, Decreased mobility, Decreased strength  Visit Diagnosis: Muscle weakness (generalized)  Unsteadiness on feet  Other abnormalities of gait and mobility     Problem List Patient Active Problem List   Diagnosis Date Noted  . 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 Acquired thrombophilia ([place]) 09/09/2019  . Abdominal aortic atherosclerosis ([place]) 09/09/2019  . Late effect of cerebrovascular accident (CVA) 03/28/2019  . CKD stage 2 due to type 2 diabetes mellitus ([place]) 12/20/2017  . Chronic anticoagulation 09/04/2017  .  Labile blood glucose   . Hypoalbuminemia due to protein-calorie malnutrition ([place])   . Ataxia, post-stroke   . History of CVA with residual deficit   . Hypothyroidism 09/13/2016  . Medicare annual wellness visit, subsequent 08/28/2015  . Vitamin D deficiency 04/29/2014  . Medication management 01/03/2014  . Type 2 diabetes mellitus ([place])   . BPH (benign prostatic hyperplasia)   . 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 Essential hypertension 11/07/2013  . Hyperlipidemia associated with type 2 diabetes mellitus ([place]) 11/07/2013  . Paroxysmal atrial fibrillation ([place]) 09/18/2012    Benjaman Artman W. 12/11/2019, 4:33 PM Frazier Butt., PT [facility] [address] [place], [place], [address] Phone: [phone]   Fax:  [phone]  Name: Landric Gumz MRN: [id number] Date of Birth: Sep 30, 1941

## 2019-12-11 NOTE — Patient Instructions (Signed)
Access Code: [email or web address]  URL: [email or web address]  Date: 12/11/2019  Prepared by: Mady Haagensen   Exercises Seated Hamstring Stretch - 3 reps - 1 sets - 30 sec hold - 2-3x daily - 7x weekly Standing Gastroc Stretch at Counter - 3 reps - 1 sets - 30 sec hold - 2-3x daily - 7x weekly Sit to Stand - 10 reps - 1 sets - 1x daily - 7x weekly

## 2019-12-12 ENCOUNTER — Ambulatory Visit: Payer: Medicare Other | Admitting: Physical Therapy

## 2019-12-12 ENCOUNTER — Other Ambulatory Visit: Payer: Self-pay

## 2019-12-12 DIAGNOSIS — R2689 Other abnormalities of gait and mobility: Secondary | ICD-10-CM

## 2019-12-12 DIAGNOSIS — R2681 Unsteadiness on feet: Secondary | ICD-10-CM | POA: Diagnosis not present

## 2019-12-12 DIAGNOSIS — M6281 Muscle weakness (generalized): Secondary | ICD-10-CM

## 2019-12-12 NOTE — Patient Instructions (Addendum)
Access Code: [email or web address]  URL: [email or web address]  Date: 12/12/2019  Prepared by: Mady Haagensen   Exercises Seated Hamstring Stretch - 3 reps - 1 sets - 30 sec hold - 2-3x daily - 7x weekly Standing Gastroc Stretch at Counter - 3 reps - 1 sets - 30 sec hold - 2-3x daily - 7x weekly Sit to Stand - 10 reps - 1 sets - 1x daily - 7x weekly  Added to HEP 12/12/2019:  Seated March - 10 reps - 1-2 sets - 1-2x daily - 5x weekly Seated Long Arc Quad - 10 reps - 1-2 sets - 1-2x daily - 5x weekly Seated Ankle Pumps on Table - 10 reps - 1-2 sets - 1-2x daily - 5x weekly

## 2019-12-12 NOTE — Therapy (Signed)
[place] [address] [place], [place], [address] Phone: [phone]   Fax:  [phone]  Physical Therapy Treatment  Patient Details  Name: Earl [name] MRN: [id number] Date of Birth: 10-19-1941 Referring Provider (PT): Delice Lesch   Encounter Date: 12/12/2019  PT End of Session - 12/12/19 [id number]    Visit Number  3    Number of Visits  17    Date for PT Re-Evaluation  02/25/20   60 day poc but 90 day cert due to scheduling   Authorization Type  10th visit progress note    PT Start Time  1101    PT Stop Time  1146    PT Time Calculation (min)  45 min    Activity Tolerance  Patient tolerated treatment well    Behavior During Therapy  [facility] for tasks assessed/performed       Past Medical History:  Diagnosis Date  . Adenomatous colon polyp   . Allergy   . Anal fissure   . Anxiety   . Arthritis   . BPH (benign prostatic hyperplasia)   . Cataract   . Diabetes mellitus ([place])   . GERD (gastroesophageal reflux disease)   . Hyperlipidemia   . Hypertension   . Hypothyroidism   . Other testicular hypofunction   . Stroke ([place])   . Type II or unspecified type diabetes mellitus without mention of complication, not stated as uncontrolled     Past Surgical History:  Procedure Laterality Date  . CIRCUMCISION N/A 06/11/2019   Procedure: CIRCUMCISION ADULT;  Surgeon: [name], MD;  Location: [facility];  Service: Urology;  Laterality: N/A;  . COLONOSCOPY    . INGUINAL HERNIA REPAIR Bilateral    1968 and 2004  . LOOP RECORDER INSERTION N/A 07/28/2017   Procedure: LOOP RECORDER INSERTION;  Surgeon: Deboraha Sprang, MD;  Location: [place] [facility];  Service: Cardiovascular;  Laterality: N/A;  . TEE WITHOUT CARDIOVERSION N/A 07/28/2017   Procedure: TRANSESOPHAGEAL ECHOCARDIOGRAM (TEE);  Surgeon: Sueanne Margarita, MD;  Location: [facility] ENDOSCOPY;  Service: Cardiovascular;  Laterality: N/A;    There were no vitals filed  for this visit.  Subjective Assessment - 12/12/19 1105    Subjective  Having some general soreness from the exercises yesterday.    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    Currently in Pain?  Yes    Pain Score  3     Pain Location  Leg    Pain Orientation  Right;Left    Pain Descriptors / Indicators  Sore    Pain Type  Acute pain   from new exercises yesterday   Pain Onset  More than a month ago    Aggravating Factors   just started with exercise yesterday    Pain Relieving Factors  unsure                       [facility] Adult PT Treatment/Exercise - 12/12/19 0001      Transfers   Transfers  Sit to Stand;Stand to Sit    Sit to Stand  5: Supervision;Without upper extremity assist;From bed    Stand to Sit  5: Supervision;Without upper extremity assist;To bed    Number of Reps  --   5 reps throughout session     Ambulation/Gait  Ambulation/Gait  Yes    Ambulation/Gait Assistance  4: Min guard    Ambulation/Gait Assistance Details  With no cane during session    Ambulation Distance (Feet)  60 Feet   x 2; 40 ft x 2   Assistive device  Straight cane;None   Walks in and out with cane, no device during PT session   Gait Pattern  Step-through pattern;Decreased arm swing - right;Decreased step length - right;Decreased stance time - right;Decreased dorsiflexion - right;Narrow base of support    Gait Comments  PT provides cues through shoulders to help with weigthshifting onto RLE, VCs for relaxed arm swing and increased step length/heelstrike.  1 episode of near LOB-pt able to regain balance, due to narrow BOS.  Additional gait with cane, 80 ft x 2 reps, with noted improved step length, stride length      High Level Balance   High Level Balance Activities  Backward walking    High Level Balance Comments  Forward/back walking along counter, x 3 reps,  cues for increased step length/heelstrike in forward direction.  Standing beside counter:  forward step and weigthshift x 10 reps each leg, cues for heelstrike, controlled lift of each leg to return to midline position.      Knee/Hip Exercises: Stretches   Active Hamstring Stretch  Right;Left;30 seconds;3 reps   Review of HEP   Other Knee/Hip Stretches  Standing runner's stretch, for gastroc, 2 rep RLE x 30 sec.  Review of HEP      Knee/Hip Exercises: Aerobic   Stepper  Seated SciFit Stepper, Level 1.2, 4 extremities x 5 minutes, for flexibility and strengthening BLEs, to warm up at beginning of session.      Knee/Hip Exercises: Standing   Heel Raises  Both;1 set;10 reps    Heel Raises Limitations  Toe raises, BLEs x 10 reps     Other Standing Knee Exercises  Stagger stance forward/back rocking x 10 reps each foot position, for unweighted ankle dorsiflexion and improved weightshifting through hips.  Alternating step taps, 2 sets x 10 reps at 4" block.    Other Standing Knee Exercises  Alternating step taps to 6" step, 10 reps.  Then forward step tap and forward lean for stretch, 10 reps at 6" step, then forward step tap>hip/knee extension (into runner's stretch position) x 10 reps.      Knee/Hip Exercises: Seated   Long Arc Quad  Strengthening;Right;Left;1 set;5 sets    Other Seated Knee/Hip Exercises  Seated exercises today performed without resistance, added to HEP to use at home to decrease stiffness after sitting long periods, to improve BLE strength.    Other Seated Knee/Hip Exercises  Seated heel/toe raises, 2 sets x 5 reps    Marching  Strengthening;Right;Left;1 set;5 sets             PT Education - 12/12/19 1204    Education Details  Additions to HEP-see instructions    Person(s) Educated  Patient    Methods  Explanation;Demonstration;Handout    Comprehension  Verbalized understanding;Returned demonstration       PT Short Term Goals - 11/27/19 0815      PT SHORT TERM  GOAL #1   Title  Pt will be independent with HEP for improved balance, strength and gait.    Time  4    Period  Weeks    Status  New    Target Date  12/27/19      PT SHORT TERM GOAL #2   Title  Pt will increase 30 sec sit to stand from 6 to 8 or more for improved functional strength and mobility.    Baseline  6 on 11/26/2019    Time  4    Period  Weeks    Status  New    Target Date  12/27/19      PT SHORT TERM GOAL #3   Title  Pt will ambulate >500' on varied surfaces with SPC mod I for improved community mobility and activity tolerance.    Time  4    Period  Weeks    Status  New    Target Date  12/27/19        PT Long Term Goals - 11/27/19 0818      PT LONG TERM GOAL #1   Title  Pt will increase Berg Balance score from 44/56 to >47/56 for improved balance and decreased fall risk.    Baseline  44/56 on 11/26/2019    Time  8    Period  Weeks    Status  New    Target Date  01/25/20      PT LONG TERM GOAL #2   Title  Pt will increase gait speed from 0.21m/s to >0.75m/s for improved gait safety in the community.    Baseline  0.59m/s on 11/26/2019    Time  8    Period  Weeks    Status  New    Target Date  01/25/20      PT LONG TERM GOAL #3   Title  Pt will ambulate >1000' with SPC versus no device on all surfaces for improved gait safety.    Time  8    Period  Weeks    Status  New    Target Date  01/25/20            Plan - 12/12/19 1204    Clinical Impression Statement  Reviewed and made additions to HEP for additional lower extremity flexibility, stregnthening in sitting positions at home.  Pt feels loosened up today by use of SciFit Machine as well as standing exercises.  Continues to benefit from instruction, cueing in step length and timing for RLE with gait.    Personal Factors and Comorbidities  Comorbidity 3+    Comorbidities  DM, HTN, afib with loop recorder    Examination-Activity Limitations  Locomotion Level    Examination-Participation Restrictions  [place]  Work;Community Activity    Stability/Clinical Decision Making  Evolving/Moderate complexity    Rehab Potential  Good    PT Frequency  2x / week   plus eval   PT Duration  8 weeks    PT Treatment/Interventions  ADLs/Self Care Home Management;Gait training;Stair training;Functional mobility training;Therapeutic activities;Therapeutic exercise;Patient/family education;Neuromuscular re-education;Balance training;Manual techniques    PT Next Visit Plan  Review additions to HEP today.  Continue to work on standing exercises for weightshifting, strength, balance, and add to HEP as needed.  Gait training with improved RLE step length and RLE stance time.    Recommended Other Services  12/12/2019:  Pt asks about speech therapy; order is on chart and he has already had MBSS.  He can schedule speech eval at any time, but he requests to wait until PT is completed.    Consulted and Agree with Plan of Care  Patient       Patient will benefit from skilled therapeutic intervention in order to improve the following deficits and impairments:  Abnormal gait, Decreased activity tolerance, Decreased balance, Decreased mobility,  Decreased strength  Visit Diagnosis: Muscle weakness (generalized)  Other abnormalities of gait and mobility  Unsteadiness on feet     Problem List Patient Active Problem List   Diagnosis Date Noted  . 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 Acquired thrombophilia ([place]) 09/09/2019  . Abdominal aortic atherosclerosis ([place]) 09/09/2019  . Late effect of cerebrovascular accident (CVA) 03/28/2019  . CKD stage 2 due to type 2 diabetes mellitus ([place]) 12/20/2017  . Chronic anticoagulation 09/04/2017  . Labile blood glucose   . Hypoalbuminemia due to protein-calorie malnutrition ([place])   . Ataxia, post-stroke   . History of CVA with residual deficit   . Hypothyroidism 09/13/2016  . Medicare annual wellness visit, subsequent 08/28/2015  . Vitamin D deficiency 04/29/2014  . Medication  management 01/03/2014  . Type 2 diabetes mellitus ([place])   . BPH (benign prostatic hyperplasia)   . 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 Essential hypertension 11/07/2013  . Hyperlipidemia associated with type 2 diabetes mellitus ([place]) 11/07/2013  . Paroxysmal atrial fibrillation ([place]) 09/18/2012    Henryk Ursin W. 12/12/2019, 12:08 PM Frazier Butt., PT  [facility] [address] [place], [place], [address] Phone: [phone]   Fax:  [phone]  Name: Earl [name] MRN: [id number] Date of Birth: 1940-12-17
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
## 2019-12-13 ENCOUNTER — Inpatient Hospital Stay: Payer: Medicare Other | Attending: Oncology

## 2019-12-13 ENCOUNTER — Other Ambulatory Visit: Payer: Self-pay

## 2019-12-13 DIAGNOSIS — D472 Monoclonal gammopathy: Secondary | ICD-10-CM | POA: Diagnosis not present

## 2019-12-13 DIAGNOSIS — Z7984 Long term (current) use of oral hypoglycemic drugs: Secondary | ICD-10-CM | POA: Insufficient documentation

## 2019-12-13 DIAGNOSIS — I10 Essential (primary) hypertension: Secondary | ICD-10-CM | POA: Diagnosis not present

## 2019-12-13 DIAGNOSIS — E119 Type 2 diabetes mellitus without complications: Secondary | ICD-10-CM | POA: Insufficient documentation

## 2019-12-13 DIAGNOSIS — Z7901 Long term (current) use of anticoagulants: Secondary | ICD-10-CM | POA: Insufficient documentation

## 2019-12-13 DIAGNOSIS — Z79899 Other long term (current) drug therapy: Secondary | ICD-10-CM | POA: Diagnosis not present

## 2019-12-13 LAB — CMP (CANCER CENTER ONLY)
ALT: 135 U/L — ABNORMAL HIGH (ref 0–44)
AST: 58 U/L — ABNORMAL HIGH (ref 15–41)
Albumin: 3.8 g/dL (ref 3.5–5.0)
Alkaline Phosphatase: 90 U/L (ref 38–126)
Anion gap: 13 (ref 5–15)
BUN: 16 mg/dL (ref 8–23)
CO2: 24 mmol/L (ref 22–32)
Calcium: 9.1 mg/dL (ref 8.9–10.3)
Chloride: 107 mmol/L (ref 98–111)
Creatinine: 1.12 mg/dL (ref 0.61–1.24)
GFR, Est AFR Am: >60 mL/min (ref >60)
GFR, Estimated: >60 mL/min (ref >60)
Glucose, Bld: 117 mg/dL — ABNORMAL HIGH (ref 70–99)
Potassium: 4.2 mmol/L (ref 3.5–5.1)
Sodium: 144 mmol/L (ref 135–145)
Total Bilirubin: 1 mg/dL (ref 0.3–1.2)
Total Protein: 6.3 g/dL — ABNORMAL LOW (ref 6.5–8.1)

## 2019-12-13 LAB — CBC WITH DIFFERENTIAL (CANCER CENTER ONLY)
Abs Immature Granulocytes: 0.01 10*3/uL (ref 0.00–0.07)
Basophils Absolute: 0 10*3/uL (ref 0.0–0.1)
Basophils Relative: 1 %
Eosinophils Absolute: 0.1 10*3/uL (ref 0.0–0.5)
Eosinophils Relative: 2 %
HCT: 36.7 % — ABNORMAL LOW (ref 39.0–52.0)
Hemoglobin: 12.4 g/dL — ABNORMAL LOW (ref 13.0–17.0)
Immature Granulocytes: 0 %
Lymphocytes Relative: 30 %
Lymphs Abs: 1.8 10*3/uL (ref 0.7–4.0)
MCH: 28.2 pg (ref 26.0–34.0)
MCHC: 33.8 g/dL (ref 30.0–36.0)
MCV: 83.6 fL (ref 80.0–100.0)
Monocytes Absolute: 0.6 10*3/uL (ref 0.1–1.0)
Monocytes Relative: 11 %
Neutro Abs: 3.4 10*3/uL (ref 1.7–7.7)
Neutrophils Relative %: 56 %
Platelet Count: 161 10*3/uL (ref 150–400)
RBC: 4.39 MIL/uL (ref 4.22–5.81)
RDW: 13.7 % (ref 11.5–15.5)
WBC Count: 6 10*3/uL (ref 4.0–10.5)
nRBC: 0 % (ref 0.0–0.2)

## 2019-12-15 ENCOUNTER — Encounter: Payer: Self-pay | Admitting: Internal Medicine

## 2019-12-15 NOTE — Patient Instructions (Signed)
- Link to sign up at [place] Web site for the Covid[place]19 vaccine   https://[email or web address]-[email or web address]-covid-[email or web address]-info/covid-19-vaccine-informationco19vaccineinfo   Or call [phone]  ++++++++++++++++++++++++++++++++++  Vit D  & Vit C 1,000 mg   are recommended to help protect  against the Covid-19 and other Corona viruses.    Also it's recommended  to take  Zinc 50 mg  to help  protect against the Covid-19   and best place to get  is also on [employer].com  and don't pay more than 6-8 cents /pill !   ===================================== Coronavirus (COVID-19) Are you at risk?  Are you at risk for the Coronavirus (COVID-19)?  To be considered HIGH RISK for Coronavirus (COVID-19), you have to meet the following criteria:  . Traveled to [place], [place], [place], [place] or [place]; or in the [place] to [place], [place], [place]  . or [place]; and have fever, cough, and shortness of breath within the last 2 weeks of travel OR . Been in close contact with a person diagnosed with COVID-19 within the last 2 weeks and have  . fever, cough,and shortness of breath .  . IF YOU DO NOT MEET THESE CRITERIA, YOU ARE CONSIDERED LOW RISK FOR COVID-19.  What to do if you are HIGH RISK for COVID-19?  . If you are having a medical emergency, call 911. . Seek medical care right away. Before you go to a doctor's office, urgent care or emergency department, .  call ahead and tell them about your recent travel, contact with someone diagnosed with COVID-19  .  and your symptoms.  . You should receive instructions from your physician's office regarding next steps of care.  . When you arrive at healthcare provider, tell the healthcare staff immediately you have returned from  . visiting [place], [place], [place], [place] or [place]; or traveled in the [place] to [place], [place],  . [place] or [place] in the last  two weeks or you have been in close contact with a person diagnosed with  . COVID-19 in the last 2 weeks.   . Tell the health care staff about your symptoms: fever, cough and shortness of breath. . After you have been seen by a medical provider, you will be either: o Tested for (COVID-19) and discharged home on quarantine except to seek medical care if  o symptoms worsen, and asked to  - Stay home and avoid contact with others until you get your results (4-5 days)  - Avoid travel on public transportation if possible (such as bus, train, or airplane) or o Sent to the Emergency Department by EMS for evaluation, COVID-19 testing  and  o possible admission depending on your condition and test results.  What to do if you are LOW RISK for COVID-19?  Reduce your risk of any infection by using the same precautions used for avoiding the common cold or flu:  . Wash your hands often with soap and warm water for at least 20 seconds.  If soap and water are not readily available,  . use an alcohol-based hand sanitizer with at least 60% alcohol.  . If coughing or sneezing, cover your mouth and nose by coughing or sneezing into the elbow areas of your shirt or coat, .  into a tissue or into your sleeve (not your hands). . Avoid shaking hands with others and consider head nods or verbal greetings only. . Avoid touching your eyes, nose, or mouth with unwashed   hands.  . Avoid close contact with people who are sick. . Avoid places or events with large numbers of people in one location, like concerts or sporting events. . Carefully consider travel plans you have or are making. . If you are planning any travel outside or inside the [place], visit the CDC's Travelers' Health webpage for the latest health notices. . If you have some symptoms but not all symptoms, continue to monitor at home and seek medical attention  . if your symptoms worsen. . If you are having a medical emergency, call  911.   ++++++++++++++++++++++++++++++++ Recommend Adult Low Dose Aspirin or  coated  Aspirin 81 mg daily  To reduce risk of Colon Cancer 40 %,  Skin Cancer 26 % ,  Melanoma 46%  and  Pancreatic cancer 60% ++++++++++++++++++++++++++++++++ Vitamin D goal  is between 70-100.  Please make sure that you are taking your Vitamin D as directed.  It is very important as a natural anti-inflammatory  helping hair, skin, and nails, as well as reducing stroke and heart attack risk.  It helps your bones and helps with mood. It also decreases numerous cancer risks so please take it as directed.  Low Vit D is associated with a 200-300% higher risk for CANCER  and 200-300% higher risk for HEART   ATTACK  &  STROKE.   ...................................... It is also associated with higher death rate at younger ages,  autoimmune diseases like Rheumatoid arthritis, Lupus, Multiple Sclerosis.    Also many other serious conditions, like depression, Alzheimer's Dementia, infertility, muscle aches, fatigue, fibromyalgia - just to name a few. ++++++++++++++++++++ Recommend the book "The END of DIETING" by Dr Joel Fuhrman  & the book "The END of DIABETES " by Dr Joel Fuhrman At [employer].com - get book & Audio CD's    Being diabetic has a  300% increased risk for heart attack, stroke, cancer, and alzheimer- type vascular dementia. It is very important that you work harder with diet by avoiding all foods that are white. Avoid white rice (brown & wild rice is OK), white potatoes (sweetpotatoes in moderation is OK), White bread or wheat bread or anything made out of white flour like bagels, donuts, rolls, buns, biscuits, cakes, pastries, cookies, pizza crust, and pasta (made from white flour & egg whites) - vegetarian pasta or spinach or wheat pasta is OK. Multigrain breads like Arnold's or [employer], or multigrain sandwich thins or flatbreads.  Diet, exercise and weight loss can reverse and cure diabetes in  the early stages.  Diet, exercise and weight loss is very important in the control and prevention of complications of diabetes which affects every system in your body, ie. Brain - dementia/stroke, eyes - glaucoma/blindness, heart - heart attack/heart failure, kidneys - dialysis, stomach - gastric paralysis, intestines - malabsorption, nerves - severe painful neuritis, circulation - gangrene & loss of a leg(s), and finally cancer and Alzheimers.    I recommend avoid fried & greasy foods,  sweets/candy, white rice (brown or wild rice or Quinoa is OK), white potatoes (sweet potatoes are OK) - anything made from white flour - bagels, doughnuts, rolls, buns, biscuits,white and wheat breads, pizza crust and traditional pasta made of white flour & egg white(vegetarian pasta or spinach or wheat pasta is OK).  Multi-grain bread is OK - like multi-grain flat bread or sandwich thins. Avoid alcohol in excess. Exercise is also important.    Eat all the vegetables you want - avoid meat, especially red meat and   dairy - especially cheese.  Cheese is the most concentrated form of trans-fats which is the worst thing to clog up our arteries. Veggie cheese is OK which can be found in the fresh produce section at [employer] or [employer] or [employer]  +++++++++++++++++++++ DASH Eating Plan  DASH stands for "Dietary Approaches to Stop Hypertension."   The DASH eating plan is a healthy eating plan that has been shown to reduce high blood pressure (hypertension). Additional health benefits may include reducing the risk of type 2 diabetes mellitus, heart disease, and stroke. The DASH eating plan may also help with weight loss. WHAT DO I NEED TO KNOW ABOUT THE DASH EATING PLAN? For the DASH eating plan, you will follow these general guidelines:  Choose foods with a percent daily value for sodium of less than 5% (as listed on the food label).  Use salt-free seasonings or herbs instead of table salt or sea salt.  Check  with your health care provider or pharmacist before using salt substitutes.  Eat lower-sodium products, often labeled as "lower sodium" or "no salt added."  Eat fresh foods.  Eat more vegetables, fruits, and low-fat dairy products.  Choose whole grains. Look for the word "whole" as the first word in the ingredient list.  Choose fish   Limit sweets, desserts, sugars, and sugary drinks.  Choose heart-healthy fats.  Eat veggie cheese   Eat more home-cooked food and less restaurant, buffet, and fast food.  Limit fried foods.  Cook foods using methods other than frying.  Limit canned vegetables. If you do use them, rinse them well to decrease the sodium.  When eating at a restaurant, ask that your food be prepared with less salt, or no salt if possible.                      WHAT FOODS CAN I EAT? Read Dr Joel Fuhrman's books on The End of Dieting & The End of Diabetes  Grains Whole grain or whole wheat bread. Brown rice. Whole grain or whole wheat pasta. Quinoa, bulgur, and whole grain cereals. Low-sodium cereals. Corn or whole wheat flour tortillas. Whole grain cornbread. Whole grain crackers. Low-sodium crackers.  Vegetables Fresh or frozen vegetables (raw, steamed, roasted, or grilled). Low-sodium or reduced-sodium tomato and vegetable juices. Low-sodium or reduced-sodium tomato sauce and paste. Low-sodium or reduced-sodium canned vegetables.   Fruits All fresh, canned (in natural juice), or frozen fruits.  Protein Products  All fish and seafood.  Dried beans, peas, or lentils. Unsalted nuts and seeds. Unsalted canned beans.  Dairy Low-fat dairy products, such as skim or 1% milk, 2% or reduced-fat cheeses, low-fat ricotta or cottage cheese, or plain low-fat yogurt. Low-sodium or reduced-sodium cheeses.  Fats and Oils Tub margarines without trans fats. Light or reduced-fat mayonnaise and salad dressings (reduced sodium). Avocado. Safflower, olive, or canola oils. Natural  peanut or almond butter.  Other Unsalted popcorn and pretzels. The items listed above may not be a complete list of recommended foods or beverages. Contact your dietitian for more options.  +++++++++++++++  WHAT FOODS ARE NOT RECOMMENDED? Grains/ White flour or wheat flour White bread. White pasta. White rice. Refined cornbread. Bagels and croissants. Crackers that contain trans fat.  Vegetables  Creamed or fried vegetables. Vegetables in a . Regular canned vegetables. Regular canned tomato sauce and paste. Regular tomato and vegetable juices.  Fruits Dried fruits. Canned fruit in light or heavy syrup. Fruit juice.  Meat and Other Protein Products Meat   in general - RED meat & White meat.  Fatty cuts of meat. Ribs, chicken wings, all processed meats as bacon, sausage, bologna, salami, fatback, hot dogs, bratwurst and packaged luncheon meats.  Dairy Whole or 2% milk, cream, half-and-half, and cream cheese. Whole-fat or sweetened yogurt. Full-fat cheeses or blue cheese. Non-dairy creamers and whipped toppings. Processed cheese, cheese spreads, or cheese curds.  Condiments Onion and garlic salt, seasoned salt, table salt, and sea salt. Canned and packaged gravies. Worcestershire sauce. Tartar sauce. Barbecue sauce. Teriyaki sauce. Soy sauce, including reduced sodium. Steak sauce. Fish sauce. Oyster sauce. Cocktail sauce. Horseradish. Ketchup and mustard. Meat flavorings and tenderizers. Bouillon cubes. Hot sauce. Tabasco sauce. Marinades. Taco seasonings. Relishes.  Fats and Oils Butter, stick margarine, lard, shortening and bacon fat. Coconut, palm kernel, or palm oils. Regular salad dressings.  Pickles and olives. Salted popcorn and pretzels.  The items listed above may not be a complete list of foods and beverages to avoid.   

## 2019-12-15 NOTE — Progress Notes (Signed)
History of Present Illness:      This very nice 79 y.o. Earl [name]  presents for 6 month follow up with HTN, HLD, T2_NIDDM and Vitamin D Deficiency. His GERD is controlled with his meds. 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      Patient is treated for HTN (1980)  & BP has been controlled at home. Today's BP is at goal - 136/78. Patient has had no complaints of any cardiac type chest pain, palpitations, dyspnea / orthopnea / PND, dizziness, claudication, or dependent edema. 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 He does use a rolling walker for gait stability.       Hyperlipidemia is controlled with diet & gLipitor. Patient denies myalgias or other med SE's. Last Lipids were at goal:  Lab Results  Component Value Date   CHOL 110 09/06/2019   HDL 56 09/06/2019   LDLCALC 40 09/06/2019   TRIG 48 09/06/2019   CHOLHDL 2.0 09/06/2019        In 2014, Patient was dx'd Hypothyroid & initiated on Thyroid Replacement.       Also, the patient has history of T2_NIDDM since 1994 and is on Metformin & Glipizide.  He still monitors his Diabetes by urine testing as he has been resistant to use a traditional glucometer.  Patient  has had no symptoms of reactive hypoglycemia, diabetic polys, paresthesias or visual blurring.  Last A1c was not at goal:  Lab Results  Component Value Date   HGBA1C 6.3 (H) 09/06/2019        Further, the patient also has history of Vitamin D Deficiency and supplements vitamin D without any suspected side-effects. Last vitamin D was at goal:  Lab Results  Component Value Date   VD25OH 64 06/06/2019    Current Outpatient Medications on File Prior to Visit  Medication Sig  . apixaban (ELIQUIS) 5 MG TABS tablet Take 1 tablet 2 x /day to Prevent Blood Clots  . atorvastatin (LIPITOR) 40 MG tablet TAKE 1 TABLET BY MOUTH  DAILY AT 6 PM (Patient taking differently: Take 40 mg by  mouth at bedtime. )  . Blood Glucose Monitoring Suppl (ONE TOUCH ULTRA 2) w/Device KIT Check blood sugar 1 time a day-DX-E11.9  . Cholecalciferol (VITAMIN D PO) Take 1 tablet by mouth daily.   . Cyanocobalamin (B-12) 50 MCG TABS Take 50 mcg by mouth daily.  . finasteride (PROSCAR) 5 MG tablet Take 1 tablet Daily for Prostate  . glipiZIDE (GLUCOTROL) 5 MG tablet Take 1 tablet 2 x /day with Meals for Diabetes  . glucose blood (ONETOUCH ULTRA) test strip Check blood sugar 1 time a day-DX-E11.9  . Lancets Misc. (ONE TOUCH SURESOFT) MISC Check blood sugar 1 time a day-DX-E11.91  . levothyroxine (SYNTHROID) 50 MCG tablet Take 1 tablet daily on an empty stomach with only water for 30 minutes & no Antacid meds, Calcium or Magnesium for 4 hours & avoid Biotin  . Magnesium 250 MG TABS Take 250 mg by mouth daily.   . metFORMIN (GLUCOPHAGE-XR) 500 MG 24 hr tablet Take 2 tablets 2 x  /day with Meals for Diabetes  . Multiple Vitamin (MULTIVITAMIN) capsule Take 1 capsule by mouth daily.    [name] omeprazole (PRILOSEC) 40 MG capsule Take 1 capsule (40 mg total) by mouth daily.  . vitamin C (ASCORBIC ACID) 500 MG tablet Take 500  mg by mouth daily.     No current facility-administered medications on file prior to visit.    Allergies  Allergen Reactions  . Penicillins Other (See Comments)    PMHx:   Past Medical History:  Diagnosis Date  . Adenomatous colon polyp   . Allergy   . Anal fissure   . Anxiety   . Arthritis   . BPH (benign prostatic hyperplasia)   . Cataract   . Diabetes mellitus ([place])   . GERD (gastroesophageal reflux disease)   . Hyperlipidemia   . Hypertension   . Hypothyroidism   . Other testicular hypofunction   . Stroke ([place])   . Type II or unspecified type diabetes mellitus without mention of complication, not stated as uncontrolled    Immunization History  Administered Date(s) Administered  . DT (Pediatric) 02/09/2015  . Influenza Split 08/07/2013  . Influenza, High Dose  Seasonal PF 08/19/2014, 08/28/2015, 09/13/2016, 08/15/2017, 08/15/2018, 09/06/2019  . PFIZER SARS-COV-2 Vaccination 12/11/2019  . Pneumococcal Conjugate-13 06/10/2016  . Pneumococcal Polysaccharide-23 08/13/2008  . Td 11/22/2003   Past Surgical History:  Procedure Laterality Date  . CIRCUMCISION N/A 06/11/2019   Procedure: CIRCUMCISION ADULT;  Surgeon: [name], MD;  Location: [facility];  Service: Urology;  Laterality: N/A;  . COLONOSCOPY    . INGUINAL HERNIA REPAIR Bilateral    1968 and 2004  . LOOP RECORDER INSERTION N/A 07/28/2017   Procedure: LOOP RECORDER INSERTION;  Surgeon: Deboraha Sprang, MD;  Location: [facility];  Service: Cardiovascular;  Laterality: N/A;  . TEE WITHOUT CARDIOVERSION N/A 07/28/2017   Procedure: TRANSESOPHAGEAL ECHOCARDIOGRAM (TEE);  Surgeon: Sueanne Margarita, MD;  Location: [name];  Service: Cardiovascular;  Laterality: N/A;    FHx:    Reviewed / unchanged  SHx:    Reviewed / unchanged   Systems Review:  Constitutional: Denies fever, chills, wt changes, headaches, insomnia, fatigue, night sweats, change in appetite. Eyes: Denies redness, blurred vision, diplopia, discharge, itchy, watery eyes.  ENT: Denies discharge, congestion, post nasal drip, epistaxis, sore throat, earache, hearing loss, dental pain, tinnitus, vertigo, sinus pain, snoring.  CV: Denies chest pain, palpitations, irregular heartbeat, syncope, dyspnea, diaphoresis, orthopnea, PND, claudication or edema. Respiratory: denies cough, dyspnea, DOE, pleurisy, hoarseness, laryngitis, wheezing.  Gastrointestinal: Denies dysphagia, odynophagia, heartburn, reflux, water brash, abdominal pain or cramps, nausea, vomiting, bloating, diarrhea, constipation, hematemesis, melena, hematochezia  or hemorrhoids. Genitourinary: Denies dysuria, frequency, urgency, nocturia, hesitancy, discharge, hematuria or flank pain. Musculoskeletal: Denies arthralgias, myalgias, stiffness, jt. swelling, pain,  limping or strain/sprain.  Skin: Denies pruritus, rash, hives, warts, acne, eczema or change in skin lesion(s). Neuro: No weakness, tremor, incoordination, spasms, paresthesia or pain. Psychiatric: Denies confusion, memory loss or sensory loss. Endo: Denies change in weight, skin or hair change.  Heme/Lymph: No excessive bleeding, bruising or enlarged lymph nodes.  Physical Exam  BP 136/78   Pulse 100   Temp (!) 97.2 F (36.2 C)   Resp 16   Ht 6' (1.829 m)   Wt 177 lb 6.4 oz (80.5 kg)   BMI 24.06 kg/m   Appears  well nourished, well groomed  and in no distress.  Eyes: PERRLA, EOMs, conjunctiva no swelling or erythema. Sinuses: No frontal/maxillary tenderness ENT/Mouth: EAC's clear, TM's nl w/o erythema, bulging. Nares clear w/o erythema, swelling, exudates. Oropharynx clear without erythema or exudates. Oral hygiene is good. Tongue normal, non obstructing. Hearing intact.  Neck: Supple. Thyroid not palpable. Car 2+/2+ without bruits, nodes or JVD. Chest: Respirations nl with BS  clear & equal w/o rales, rhonchi, wheezing or stridor.  Cor: Heart sounds normal w/ regular rate and rhythm without sig. murmurs, gallops, clicks or rubs. Peripheral pulses normal and equal  without edema.  Abdomen: Soft & bowel sounds normal. Non-tender w/o guarding, rebound, hernias, masses or organomegaly.  Lymphatics: Unremarkable.  Musculoskeletal: Full ROM all peripheral extremities, joint stability, 5/5 strength and normal gait.  Skin: Warm, dry without exposed rashes, lesions or ecchymosis apparent.  Neuro: Cranial nerves intact, reflexes equal bilaterally. Sensory-motor testing grossly intact. Tendon reflexes grossly intact.  Pysch: Alert & oriented x 3.  Insight and judgement nl & appropriate. No ideations.  Assessment and Plan:  1. Essential hypertension  - Continue medication, monitor blood pressure at home.  - Continue DASH diet.  Reminder to go to the ER if any CP,  SOB, nausea, dizziness,  severe HA, changes vision/speech.  - CBC with Differential/Platelet - COMPLETE METABOLIC PANEL WITH GFR - Magnesium - TSH  2. Hyperlipidemia associated with type 2 diabetes mellitus ([place])  - Continue diet/meds, exercise,& lifestyle modifications.  - Continue monitor periodic cholesterol/liver & renal functions   - Lipid panel - TSH  3. Type 2 diabetes mellitus with stage 2 chronic kidney disease, without long-term current use of insulin ([facility])  - Continue diet, exercise  - Lifestyle modifications.  - Monitor appropriate labs.  - Hemoglobin A1c - Insulin, random  4. Vitamin D deficiency  - Continue supplementation.  - VITAMIN D 25 Hydroxy  5. Hypothyroidism  - TSH  6. Paroxysmal atrial fibrillation ([facility]) - TSH  7. Medication management  - CBC with Differential/Platelet - COMPLETE METABOLIC PANEL WITH GFR - Magnesium - Lipid panel - TSH - Hemoglobin A1c - Insulin, random - VITAMIN D 25 Hydroxy        Discussed  regular exercise, BP monitoring, weight control to achieve/maintain BMI less than 25 and discussed med and SE's. Recommended labs to assess and monitor clinical status with further disposition pending results of labs.  I discussed the assessment and treatment plan with the patient. The patient was provided an opportunity to ask questions and all were answered. The patient agreed with the plan and demonstrated an understanding of the instructions.  I provided over 30 minutes of exam, counseling, chart review and  complex critical decision making.  Kirtland Bouchard, MD

## 2019-12-16 ENCOUNTER — Other Ambulatory Visit: Payer: Self-pay

## 2019-12-16 ENCOUNTER — Ambulatory Visit: Payer: Medicare Other | Admitting: Internal Medicine

## 2019-12-16 ENCOUNTER — Ambulatory Visit (INDEPENDENT_AMBULATORY_CARE_PROVIDER_SITE_OTHER): Payer: Medicare Other | Admitting: Physician Assistant

## 2019-12-16 ENCOUNTER — Ambulatory Visit (INDEPENDENT_AMBULATORY_CARE_PROVIDER_SITE_OTHER): Payer: Medicare Other | Admitting: Internal Medicine

## 2019-12-16 VITALS — BP 136/78 | HR 100 | Temp 97.2°F | Resp 16 | Ht 72.0 in | Wt 177.4 lb

## 2019-12-16 VITALS — BP 144/64 | HR 119 | Ht 71.0 in | Wt 176.0 lb

## 2019-12-16 DIAGNOSIS — I480 Paroxysmal atrial fibrillation: Secondary | ICD-10-CM

## 2019-12-16 DIAGNOSIS — E039 Hypothyroidism, unspecified: Secondary | ICD-10-CM | POA: Diagnosis not present

## 2019-12-16 DIAGNOSIS — Z4509 Encounter for adjustment and management of other cardiac device: Secondary | ICD-10-CM | POA: Diagnosis not present

## 2019-12-16 DIAGNOSIS — E1169 Type 2 diabetes mellitus with other specified complication: Secondary | ICD-10-CM | POA: Diagnosis not present

## 2019-12-16 DIAGNOSIS — E785 Hyperlipidemia, unspecified: Secondary | ICD-10-CM | POA: Diagnosis not present

## 2019-12-16 DIAGNOSIS — E1122 Type 2 diabetes mellitus with diabetic chronic kidney disease: Secondary | ICD-10-CM

## 2019-12-16 DIAGNOSIS — R000 Tachycardia, unspecified: Secondary | ICD-10-CM

## 2019-12-16 DIAGNOSIS — I10 Essential (primary) hypertension: Secondary | ICD-10-CM

## 2019-12-16 DIAGNOSIS — E559 Vitamin D deficiency, unspecified: Secondary | ICD-10-CM | POA: Diagnosis not present

## 2019-12-16 DIAGNOSIS — N182 Chronic kidney disease, stage 2 (mild): Secondary | ICD-10-CM

## 2019-12-16 DIAGNOSIS — E1140 Type 2 diabetes mellitus with diabetic neuropathy, unspecified: Secondary | ICD-10-CM

## 2019-12-16 DIAGNOSIS — Z79899 Other long term (current) drug therapy: Secondary | ICD-10-CM

## 2019-12-16 LAB — KAPPA/LAMBDA LIGHT CHAINS
Kappa free light chain: 14 mg/L (ref 3.3–19.4)
Kappa, lambda light chain ratio: 0.89 (ref 0.26–1.65)
Lambda free light chains: 15.8 mg/L (ref 5.7–26.3)

## 2019-12-16 MED ORDER — GABAPENTIN 100 MG PO CAPS: ORAL_CAPSULE | ORAL | 0 refills | Status: DC

## 2019-12-16 MED ORDER — ALPRAZOLAM 1 MG PO TABS: ORAL_TABLET | ORAL | 0 refills | Status: DC

## 2019-12-16 NOTE — Patient Instructions (Signed)
Medication Instructions:  Your physician recommends that you continue on your current medications as directed. Please refer to the Current Medication list given to you today.  *If you need a refill on your cardiac medications before your next appointment, please call your pharmacy*  Lab Work: NONE ORDERED  TODAY  If you have labs (blood work) drawn today and your tests are completely normal, you will receive your results only by: . MyChart Message (if you have MyChart) OR . A paper copy in the mail If you have any lab test that is abnormal or we need to change your treatment, we will call you to review the results.  Testing/Procedures: NONE ORDERED  TODAY  Follow-Up: At [facility], you and your health needs are our priority.  As part of our continuing mission to provide you with exceptional heart care, we have created designated Provider Care Teams.  These Care Teams include your primary Cardiologist (physician) and Advanced Practice Providers (APPs -  Physician Assistants and Nurse Practitioners) who all work together to provide you with the care you need, when you need it.  Your next appointment:   1 year(s)  The format for your next appointment:   In Person  Provider:   You may see  or one of the following Advanced Practice Providers on your designated Care Team:    Amber Seiler, NP  Renee Ursuy, PA-C  Michael "Andy" Tillery, PA-C   Other Instructions  

## 2019-12-17 LAB — CBC WITH DIFFERENTIAL/PLATELET
Absolute Monocytes: 405 cells/uL (ref 200–950)
Basophils Absolute: 41 cells/uL (ref 0–200)
Basophils Relative: 0.9 %
Eosinophils Absolute: 18 cells/uL (ref 15–500)
Eosinophils Relative: 0.4 %
HCT: 36.9 % — ABNORMAL LOW (ref 38.5–50.0)
Hemoglobin: 12.3 g/dL — ABNORMAL LOW (ref 13.2–17.1)
Lymphs Abs: 1076 cells/uL (ref 850–3900)
MCH: 28.3 pg (ref 27.0–33.0)
MCHC: 33.3 g/dL (ref 32.0–36.0)
MCV: 85 fL (ref 80.0–100.0)
MPV: 11.4 fL (ref 7.5–12.5)
Monocytes Relative: 8.8 %
Neutro Abs: 3059 cells/uL (ref 1500–7800)
Neutrophils Relative %: 66.5 %
Platelets: 162 10*3/uL (ref 140–400)
RBC: 4.34 10*6/uL (ref 4.20–5.80)
RDW: 12.9 % (ref 11.0–15.0)
Total Lymphocyte: 23.4 %
WBC: 4.6 10*3/uL (ref 3.8–10.8)

## 2019-12-17 LAB — COMPLETE METABOLIC PANEL WITH GFR
AG Ratio: 2 (calc) (ref 1.0–2.5)
ALT: 105 U/L — ABNORMAL HIGH (ref 9–46)
AST: 54 U/L — ABNORMAL HIGH (ref 10–35)
Albumin: 3.9 g/dL (ref 3.6–5.1)
Alkaline phosphatase (APISO): 82 U/L (ref 35–144)
BUN: 18 mg/dL (ref 7–25)
CO2: 27 mmol/L (ref 20–32)
Calcium: 9.3 mg/dL (ref 8.6–10.3)
Chloride: 107 mmol/L (ref 98–110)
Creat: 1.07 mg/dL (ref 0.70–1.18)
GFR, Est African American: 77 mL/min/{1.73_m2} (ref >=60)
GFR, Est Non African American: 66 mL/min/{1.73_m2} (ref >=60)
Globulin: 2 g/dL (calc) (ref 1.9–3.7)
Glucose, Bld: 184 mg/dL — ABNORMAL HIGH (ref 65–99)
Potassium: 4.3 mmol/L (ref 3.5–5.3)
Sodium: 143 mmol/L (ref 135–146)
Total Bilirubin: 0.6 mg/dL (ref 0.2–1.2)
Total Protein: 5.9 g/dL — ABNORMAL LOW (ref 6.1–8.1)

## 2019-12-17 LAB — LIPID PANEL
Cholesterol: 111 mg/dL (ref <200)
HDL: 51 mg/dL (ref >=40)
LDL Cholesterol (Calc): 49 mg/dL (calc)
Non-HDL Cholesterol (Calc): 60 mg/dL (calc) (ref <130)
Total CHOL/HDL Ratio: 2.2 (calc) (ref <5.0)
Triglycerides: 39 mg/dL (ref <150)

## 2019-12-17 LAB — MULTIPLE MYELOMA PANEL, SERUM
Albumin SerPl Elph-Mcnc: 3.3 g/dL (ref 2.9–4.4)
Albumin/Glob SerPl: 1.5 (ref 0.7–1.7)
Alpha 1: 0.2 g/dL (ref 0.0–0.4)
Alpha2 Glob SerPl Elph-Mcnc: 0.6 g/dL (ref 0.4–1.0)
B-Globulin SerPl Elph-Mcnc: 1 g/dL (ref 0.7–1.3)
Gamma Glob SerPl Elph-Mcnc: 0.5 g/dL (ref 0.4–1.8)
Globulin, Total: 2.3 g/dL (ref 2.2–3.9)
IgA: 254 mg/dL (ref 61–437)
IgG (Immunoglobin G), Serum: 549 mg/dL — ABNORMAL LOW (ref 603–1613)
IgM (Immunoglobulin M), Srm: 14 mg/dL — ABNORMAL LOW (ref 15–143)
M Protein SerPl Elph-Mcnc: 0.2 g/dL — ABNORMAL HIGH
Total Protein ELP: 5.6 g/dL — ABNORMAL LOW (ref 6.0–8.5)

## 2019-12-17 LAB — HEMOGLOBIN A1C
Hgb A1c MFr Bld: 6.3 % of total Hgb — ABNORMAL HIGH (ref <5.7)
Mean Plasma Glucose: 134 (calc)
eAG (mmol/L): 7.4 (calc)

## 2019-12-17 LAB — INSULIN, RANDOM: Insulin: 24.6 u[IU]/mL — ABNORMAL HIGH

## 2019-12-17 LAB — TSH: TSH: 0.69 mIU/L (ref 0.40–4.50)

## 2019-12-17 LAB — MAGNESIUM: Magnesium: 1.8 mg/dL (ref 1.5–2.5)

## 2019-12-17 LAB — VITAMIN D 25 HYDROXY (VIT D DEFICIENCY, FRACTURES): Vit D, 25-Hydroxy: 73 ng/mL (ref 30–100)

## 2019-12-18 ENCOUNTER — Other Ambulatory Visit: Payer: Self-pay

## 2019-12-18 ENCOUNTER — Ambulatory Visit: Payer: Medicare Other

## 2019-12-18 DIAGNOSIS — R2689 Other abnormalities of gait and mobility: Secondary | ICD-10-CM

## 2019-12-18 DIAGNOSIS — M6281 Muscle weakness (generalized): Secondary | ICD-10-CM

## 2019-12-18 DIAGNOSIS — R2681 Unsteadiness on feet: Secondary | ICD-10-CM | POA: Diagnosis not present

## 2019-12-18 NOTE — Therapy (Signed)
[facility] [address] [place], [place], [address] Phone: [phone]   Fax:  [phone]  Physical Therapy Treatment  Patient Details  Name: Earl [name] MRN: [id number] Date of Birth: Aug 30, 1941 Referring Provider (PT): Delice Lesch   Encounter Date: 12/18/2019  PT End of Session - 12/18/19 [id number]    Visit Number  4    Number of Visits  17    Date for PT Re-Evaluation  02/25/20   60 day poc but 90 day cert due to scheduling   Authorization Type  10th visit progress note    PT Start Time  1446    PT Stop Time  1528    PT Time Calculation (min)  42 min    Activity Tolerance  Patient tolerated treatment well    Behavior During Therapy  [facility] for tasks assessed/performed       Past Medical History:  Diagnosis Date  . Adenomatous colon polyp   . Allergy   . Anal fissure   . Anxiety   . Arthritis   . BPH (benign prostatic hyperplasia)   . Cataract   . Diabetes mellitus ([place])   . GERD (gastroesophageal reflux disease)   . Hyperlipidemia   . Hypertension   . Hypothyroidism   . Other testicular hypofunction   . Stroke ([place])   . Type II or unspecified type diabetes mellitus without mention of complication, not stated as uncontrolled     Past Surgical History:  Procedure Laterality Date  . CIRCUMCISION N/A 06/11/2019   Procedure: CIRCUMCISION ADULT;  Surgeon: [name], MD;  Location: [facility];  Service: Urology;  Laterality: N/A;  . COLONOSCOPY    . INGUINAL HERNIA REPAIR Bilateral    1968 and 2004  . LOOP RECORDER INSERTION N/A 07/28/2017   Procedure: LOOP RECORDER INSERTION;  Surgeon: Deboraha Sprang, MD;  Location: [facility] CV LAB;  Service: Cardiovascular;  Laterality: N/A;  . TEE WITHOUT CARDIOVERSION N/A 07/28/2017   Procedure: TRANSESOPHAGEAL ECHOCARDIOGRAM (TEE);  Surgeon: Sueanne Margarita, MD;  Location: [facility] ENDOSCOPY;  Service: Cardiovascular;  Laterality: N/A;    There were no vitals filed  for this visit.  Subjective Assessment - 12/18/19 1448    Subjective  Pt reports was a little tired after last session but doing well. Feels breathing is doing a little better.    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    Currently in Pain?  No/denies    Pain Onset  More than a month ago                       [facility] Adult PT Treatment/Exercise - 12/18/19 1449      Transfers   Transfers  Sit to Stand;Stand to Sit    Sit to Stand  5: Supervision    Stand to Sit  5: Supervision      Ambulation/Gait   Ambulation/Gait  Yes    Ambulation/Gait Assistance  4: Min guard    Ambulation/Gait Assistance Details  PT provided some faciliation at hips during gait to weight shift over right and instructed to try to increase step length.    Ambulation Distance (Feet)  115 Feet    Assistive device  None   walks in and out with cane   Gait Pattern  Step-through pattern;Decreased step length -  right;Decreased step length - left;Decreased weight shift to right      Neuro Re-ed    Neuro Re-ed Details   Standing at // bars with stepping foward and back x 10 each leg then x 10 with stepping over 4" foam x 10 each leg. HR=110 after. After sitting a couple minutes back down to 98. Reciprocal steps over 3 foam with 1 UE support on bar x 3 laps then side stepping with bilateral UE support x 4 laps. Standing on 2X4" foam 30 sec x 2 with occasional need for UE support as lost balance posterior. Standing on rockerboard positioned ant/posterior 30 sec x 2 then with arm movements up/out/across chest x 10. Standing on rockerboard positioned lateral trying to maintain level 30 sec x 2. CGA with all activities for safety. Tendency to lose balance posterior with compliant surfaces when does.      Knee/Hip Exercises: Aerobic   Stepper  Seated Sci Fit end of session level 1.4 all  extremities x 5 min. HR=118 after.              PT Education - 12/18/19 1722    Education Details  Pt to continue with current HEP    Person(s) Educated  Patient    Methods  Explanation    Comprehension  Verbalized understanding       PT Short Term Goals - 11/27/19 0815      PT SHORT TERM GOAL #1   Title  Pt will be independent with HEP for improved balance, strength and gait.    Time  4    Period  Weeks    Status  New    Target Date  12/27/19      PT SHORT TERM GOAL #2   Title  Pt will increase 30 sec sit to stand from 6 to 8 or more for improved functional strength and mobility.    Baseline  6 on 11/26/2019    Time  4    Period  Weeks    Status  New    Target Date  12/27/19      PT SHORT TERM GOAL #3   Title  Pt will ambulate >500' on varied surfaces with SPC mod I for improved community mobility and activity tolerance.    Time  4    Period  Weeks    Status  New    Target Date  12/27/19        PT Long Term Goals - 11/27/19 0818      PT LONG TERM GOAL #1   Title  Pt will increase Berg Balance score from 44/56 to >47/56 for improved balance and decreased fall risk.    Baseline  44/56 on 11/26/2019    Time  8    Period  Weeks    Status  New    Target Date  01/25/20      PT LONG TERM GOAL #2   Title  Pt will increase gait speed from 0.24m/s to >0.32m/s for improved gait safety in the community.    Baseline  0.5m/s on 11/26/2019    Time  8    Period  Weeks    Status  New    Target Date  01/25/20      PT LONG TERM GOAL #3   Title  Pt will ambulate >1000' with SPC versus no device on all surfaces for improved gait safety.    Time  8    Period  Weeks  Status  New    Target Date  01/25/20            Plan - 12/18/19 1722    Clinical Impression Statement  PT focused on improving step length with various activities during session. Pt was able to show some improvement as session went on. HR elevated to >80% of max HR with more aerobic activities.     Personal Factors and Comorbidities  Comorbidity 3+    Comorbidities  DM, HTN, afib with loop recorder    Examination-Activity Limitations  Locomotion Level    Examination-Participation Restrictions  Yard Work;Community Activity    Stability/Clinical Decision Making  Evolving/Moderate complexity    Rehab Potential  Good    PT Frequency  2x / week   plus eval   PT Duration  8 weeks    PT Treatment/Interventions  ADLs/Self Care Home Management;Gait training;Stair training;Functional mobility training;Therapeutic activities;Therapeutic exercise;Patient/family education;Neuromuscular re-education;Balance training;Manual techniques    PT Next Visit Plan  How is HEP going? Continue to work on standing exercises for weightshifting, strength, balance, and add to HEP as needed.  Gait training with improved RLE step length and RLE stance time.    Consulted and Agree with Plan of Care  Patient       Patient will benefit from skilled therapeutic intervention in order to improve the following deficits and impairments:  Abnormal gait, Decreased activity tolerance, Decreased balance, Decreased mobility, Decreased strength  Visit Diagnosis: Muscle weakness (generalized)  Other abnormalities of gait and mobility     Problem List Patient Active Problem List   Diagnosis Date Noted  . 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 Acquired thrombophilia ([place]) 09/09/2019  . Abdominal aortic atherosclerosis ([place]) 09/09/2019  . Late effect of cerebrovascular accident (CVA) 03/28/2019  . CKD stage 2 due to type 2 diabetes mellitus ([place]) 12/20/2017  . Chronic anticoagulation 09/04/2017  . Labile blood glucose   . Hypoalbuminemia due to protein-calorie malnutrition ([place])   . Ataxia, post-stroke   . History of CVA with residual deficit   . Hypothyroidism 09/13/2016  . Medicare annual wellness visit, subsequent 08/28/2015  . Vitamin D deficiency 04/29/2014  . Medication management 01/03/2014  . Type 2 diabetes  mellitus ([place])   . BPH (benign prostatic hyperplasia)   . 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 Essential hypertension 11/07/2013  . Hyperlipidemia associated with type 2 diabetes mellitus ([place]) 11/07/2013  . Paroxysmal atrial fibrillation ([place]) 09/18/2012    Electa Sniff, PT, DPT, NCS 12/18/2019, 5:25 PM  [facility] [address] [place], [place], [address] Phone: [phone]   Fax:  [phone]  Name: Earl [name] MRN: [id number] Date of Birth: [birth year]

## 2019-12-19 ENCOUNTER — Ambulatory Visit: Payer: Medicare Other

## 2019-12-19 DIAGNOSIS — M6281 Muscle weakness (generalized): Secondary | ICD-10-CM

## 2019-12-19 DIAGNOSIS — R2689 Other abnormalities of gait and mobility: Secondary | ICD-10-CM

## 2019-12-19 DIAGNOSIS — R2681 Unsteadiness on feet: Secondary | ICD-10-CM | POA: Diagnosis not present

## 2019-12-19 NOTE — Therapy (Signed)
[facility] [address] [place], [place], [address] Phone: [phone]   Fax:  [phone]  Physical Therapy Treatment  Patient Details  Name: Earl [name] MRN: [id number] Date of Birth: 1941-07-21 Referring Provider (PT): Delice Lesch   Encounter Date: 12/19/2019  PT End of Session - 12/19/19 1029    Visit Number  5    Number of Visits  17    Date for PT Re-Evaluation  02/25/20   60 day poc but 90 day cert due to scheduling   Authorization Type  10th visit progress note    PT Start Time  1025   pt arrived late   PT Stop Time  1100    PT Time Calculation (min)  35 min    Activity Tolerance  Patient tolerated treatment well    Behavior During Therapy  [facility] for tasks assessed/performed       Past Medical History:  Diagnosis Date  . Adenomatous colon polyp   . Allergy   . Anal fissure   . Anxiety   . Arthritis   . BPH (benign prostatic hyperplasia)   . Cataract   . Diabetes mellitus ([place])   . GERD (gastroesophageal reflux disease)   . Hyperlipidemia   . Hypertension   . Hypothyroidism   . Other testicular hypofunction   . Stroke ([place])   . Type II or unspecified type diabetes mellitus without mention of complication, not stated as uncontrolled     Past Surgical History:  Procedure Laterality Date  . CIRCUMCISION N/A 06/11/2019   Procedure: CIRCUMCISION ADULT;  Surgeon: [name], MD;  Location: [facility];  Service: Urology;  Laterality: N/A;  . COLONOSCOPY    . INGUINAL HERNIA REPAIR Bilateral    1968 and 2004  . LOOP RECORDER INSERTION N/A 07/28/2017   Procedure: LOOP RECORDER INSERTION;  Surgeon: Deboraha Sprang, MD;  Location: [facility] CV LAB;  Service: Cardiovascular;  Laterality: N/A;  . TEE WITHOUT CARDIOVERSION N/A 07/28/2017   Procedure: TRANSESOPHAGEAL ECHOCARDIOGRAM (TEE);  Surgeon: Sueanne Margarita, MD;  Location: [facility] ENDOSCOPY;  Service: Cardiovascular;  Laterality: N/A;    There  were no vitals filed for this visit.  Subjective Assessment - 12/19/19 1029    Subjective  Pt reports doing well just a little tired.    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    Currently in Pain?  No/denies    Pain Onset  More than a month ago                       [facility] Adult PT Treatment/Exercise - 12/19/19 1030      Transfers   Transfers  Sit to Stand;Stand to Sit    Sit to Stand  5: Supervision    Stand to Sit  5: Supervision      Ambulation/Gait   Ambulation/Gait  Yes    Ambulation/Gait Assistance  5: Supervision;4: Min guard    Ambulation/Gait Assistance Details  Pt was cued to try to increase step length.    Ambulation Distance (Feet)  115 Feet    Assistive device  Straight cane    Gait Pattern  Step-through pattern;Decreased step length - right;Decreased step length - left;Narrow base of support    Ambulation Surface  Level;Indoor      Neuro Re-ed  Neuro Re-ed Details   In // bars: reciprocal steps over 3 foams (2 turned on side so 4" clearance) x 4 laps with left UE support on bars and with SPC CGA. Pt fatigued after and needed to sit.      Exercises   Exercises  Other Exercises    Other Exercises   PT reviewed HEP:  seated hamstring stretch right x 30 sec with cues to lean forward to get more stretch if needed, standing right gastroc stretch x 30 sec, sit to stand x 10 from edge of mat with hands in lap. Pt had 1 LOB posterior as did not lean forward enough. Seated march x 10 bilateral, seated LAQ x 10 bilateral, seated ankle pumps. Pt fatigued with sit to stands. Able to perform current HEP well with correct form.      Knee/Hip Exercises: Aerobic   Other Aerobic  Sci Fit x 5 min level 2 all extremities . HR=104 after             PT Education - 12/19/19 1224    Education Details  Pt to continue with current HEP     Person(s) Educated  Patient;Child(ren)    Methods  Explanation    Comprehension  Verbalized understanding       PT Short Term Goals - 11/27/19 0815      PT SHORT TERM GOAL #1   Title  Pt will be independent with HEP for improved balance, strength and gait.    Time  4    Period  Weeks    Status  New    Target Date  12/27/19      PT SHORT TERM GOAL #2   Title  Pt will increase 30 sec sit to stand from 6 to 8 or more for improved functional strength and mobility.    Baseline  6 on 11/26/2019    Time  4    Period  Weeks    Status  New    Target Date  12/27/19      PT SHORT TERM GOAL #3   Title  Pt will ambulate >500' on varied surfaces with SPC mod I for improved community mobility and activity tolerance.    Time  4    Period  Weeks    Status  New    Target Date  12/27/19        PT Long Term Goals - 11/27/19 0818      PT LONG TERM GOAL #1   Title  Pt will increase Berg Balance score from 44/56 to >47/56 for improved balance and decreased fall risk.    Baseline  44/56 on 11/26/2019    Time  8    Period  Weeks    Status  New    Target Date  01/25/20      PT LONG TERM GOAL #2   Title  Pt will increase gait speed from 0.43m/s to >0.24m/s for improved gait safety in the community.    Baseline  0.1m/s on 11/26/2019    Time  8    Period  Weeks    Status  New    Target Date  01/25/20      PT LONG TERM GOAL #3   Title  Pt will ambulate >1000' with SPC versus no device on all surfaces for improved gait safety.    Time  8    Period  Weeks    Status  New    Target Date  01/25/20  Plan - 12/19/19 1234    Clinical Impression Statement  Pt was able to increase step length at end of session after practice. Pt did need a couple seated breaks as fatigues quickly.    Personal Factors and Comorbidities  Comorbidity 3+    Comorbidities  DM, HTN, afib with loop recorder    Examination-Activity Limitations  Locomotion Level    Examination-Participation Restrictions   Yard Work;Community Activity    Stability/Clinical Decision Making  Evolving/Moderate complexity    Rehab Potential  Good    PT Frequency  2x / week   plus eval   PT Duration  8 weeks    PT Treatment/Interventions  ADLs/Self Care Home Management;Gait training;Stair training;Functional mobility training;Therapeutic activities;Therapeutic exercise;Patient/family education;Neuromuscular re-education;Balance training;Manual techniques    PT Next Visit Plan  Continue to work on standing exercises for weightshifting, strength, balance, and add to HEP as needed.  Gait training with improved RLE step length and RLE stance time.    Consulted and Agree with Plan of Care  Patient       Patient will benefit from skilled therapeutic intervention in order to improve the following deficits and impairments:  Abnormal gait, Decreased activity tolerance, Decreased balance, Decreased mobility, Decreased strength  Visit Diagnosis: Muscle weakness (generalized)  Other abnormalities of gait and mobility     Problem List Patient Active Problem List   Diagnosis Date Noted  . 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 Acquired thrombophilia ([place]) 09/09/2019  . Abdominal aortic atherosclerosis ([place]) 09/09/2019  . Late effect of cerebrovascular accident (CVA) 03/28/2019  . CKD stage 2 due to type 2 diabetes mellitus ([place]) 12/20/2017  . Chronic anticoagulation 09/04/2017  . Labile blood glucose   . Hypoalbuminemia due to protein-calorie malnutrition ([place])   . Ataxia, post-stroke   . History of CVA with residual deficit   . Hypothyroidism 09/13/2016  . Medicare annual wellness visit, subsequent 08/28/2015  . Vitamin D deficiency 04/29/2014  . Medication management 01/03/2014  . Type 2 diabetes mellitus ([place])   . BPH (benign prostatic hyperplasia)   . 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 Essential hypertension 11/07/2013  . Hyperlipidemia associated with type 2 diabetes  mellitus ([place]) 11/07/2013  . Paroxysmal atrial fibrillation ([place]) 09/18/2012    Electa Sniff, PT, DPT, NCS 12/19/2019, 12:35 PM  [facility] [address] [place], [place], [address] Phone: [phone]   Fax:  [phone]  Name: Earl [name] MRN: [id number] Date of Birth: 17-Feb-1941

## 2019-12-20 ENCOUNTER — Other Ambulatory Visit: Payer: Self-pay

## 2019-12-20 ENCOUNTER — Inpatient Hospital Stay (HOSPITAL_BASED_OUTPATIENT_CLINIC_OR_DEPARTMENT_OTHER): Payer: Medicare Other | Admitting: Oncology

## 2019-12-20 VITALS — BP 140/82 | HR 102 | Temp 98.5°F | Resp 18 | Ht 72.0 in | Wt 178.7 lb

## 2019-12-20 DIAGNOSIS — D472 Monoclonal gammopathy: Secondary | ICD-10-CM | POA: Diagnosis not present

## 2019-12-20 DIAGNOSIS — Z7984 Long term (current) use of oral hypoglycemic drugs: Secondary | ICD-10-CM | POA: Diagnosis not present

## 2019-12-20 DIAGNOSIS — Z79899 Other long term (current) drug therapy: Secondary | ICD-10-CM | POA: Diagnosis not present

## 2019-12-20 DIAGNOSIS — I10 Essential (primary) hypertension: Secondary | ICD-10-CM | POA: Diagnosis not present

## 2019-12-20 DIAGNOSIS — Z7901 Long term (current) use of anticoagulants: Secondary | ICD-10-CM | POA: Diagnosis not present

## 2019-12-20 DIAGNOSIS — E119 Type 2 diabetes mellitus without complications: Secondary | ICD-10-CM | POA: Diagnosis not present

## 2019-12-20 NOTE — Progress Notes (Signed)
Hematology and Oncology Follow Up Visit  Meryl Ponder [id number] [birth year] 79 y.o. 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  He was found to have an M spike of 0.3 g/dL.    Prior Therapy: Status post bone marrow biopsy on 02/14/2003 which showed 2% plasma cell involvement.  Current therapy: Active surveillance.   Interim History:  Mr. Ramus returns for a follow-up visit.  Since the last visit, he reports feeling reasonably well without any complaints.  He denies any recent hospitalization or illnesses.  His performance status and quality of life remains unchanged.  He does ambulate with the help of cane without any recent falls or syncope.  He lives independently and his wife passed away recently.  His son and daughter to check on him periodically.    Medications: Reviewed without changes. Current Outpatient Medications  Medication Sig Dispense Refill  . ALPRAZolam (XANAX) 1 MG tablet Take 1/2-1 tablet at Bedtime  ONLY if needed for Sleep &  limit to 5 days /week to avoid Addiction & Dementia 60 tablet 0  . apixaban (ELIQUIS) 5 MG TABS tablet Take 1 tablet 2 x /day to Prevent Blood Clots 180 tablet 3  . atorvastatin (LIPITOR) 40 MG tablet TAKE 1 TABLET BY MOUTH  DAILY AT 6 PM (Patient taking differently: Take 40 mg by mouth at bedtime. ) 90 tablet 1  . Blood Glucose Monitoring Suppl (ONE TOUCH ULTRA 2) w/Device KIT Check blood sugar 1 time a day-DX-E11.9 1 kit 0  . Cholecalciferol (VITAMIN D PO) Take 1 tablet by mouth daily.     . Cyanocobalamin (B-12) 50 MCG TABS Take 50 mcg by mouth daily.    . finasteride (PROSCAR) 5 MG tablet Take 1 tablet Daily for Prostate 90 tablet 3  . gabapentin (NEURONTIN) 100 MG capsule Take 1 to 3 capsules 1 hour before Bedtime for Diabetic Neuropathy Pains 270 capsule 0  . glipiZIDE (GLUCOTROL) 5 MG tablet Take 1 tablet 2 x /day with Meals for Diabetes  180 tablet 3  . glucose blood (ONETOUCH ULTRA) test strip Check blood sugar 1 time a day-DX-E11.9 100 each 2  . Lancets Misc. (ONE TOUCH SURESOFT) MISC Check blood sugar 1 time a day-DX-E11.91 1 each 0  . levothyroxine (SYNTHROID) 50 MCG tablet Take 1 tablet daily on an empty stomach with only water for 30 minutes & no Antacid meds, Calcium or Magnesium for 4 hours & avoid Biotin 90 tablet 3  . Magnesium 250 MG TABS Take 250 mg by mouth daily.     . metFORMIN (GLUCOPHAGE-XR) 500 MG 24 hr tablet Take 2 tablets 2 x  /day with Meals for Diabetes 360 tablet 3  . Multiple Vitamin (MULTIVITAMIN) capsule Take 1 capsule by mouth daily.      [name] omeprazole (PRILOSEC) 40 MG capsule Take 1 capsule (40 mg total) by mouth daily. 30 capsule 1  . vitamin C (ASCORBIC ACID) 500 MG tablet Take 500 mg by mouth daily.       No current facility-administered medications for this visit.     Allergies:  Allergies  Allergen Reactions  . Penicillins Other (See Comments)      Physical Exam:  Blood pressure 140/82, pulse (!) 102, temperature 98.5 F (36.9 C), temperature source Temporal, resp. rate 18, height 6' (1.829 m), weight 178 lb 11.2 oz (81.1 kg), SpO2 100 %.  : ECOG: 1    General appearance: Alert, awake without  any distress. Head: Atraumatic without abnormalities Oropharynx: Without any thrush or ulcers. Eyes: No scleral icterus. Lymph nodes: No lymphadenopathy noted in the cervical, supraclavicular, or axillary nodes Heart:regular rate and rhythm, without any murmurs or gallops.   Lung: Clear to auscultation without any rhonchi, wheezes or dullness to percussion. Abdomin: Soft, nontender without any shifting dullness or ascites. Musculoskeletal: No clubbing or cyanosis. Neurological: No motor or sensory deficits. Skin: No rashes or lesions.     Lab Results: Lab Results  Component Value Date   WBC 4.6 12/16/2019   HGB 12.3 (L) 12/16/2019   HCT 36.9 (L) 12/16/2019   MCV 85.0  12/16/2019   PLT 162 12/16/2019     Chemistry      Component Value Date/Time   NA 143 12/16/2019 1138   NA 140 10/25/2017 0753   K 4.3 12/16/2019 1138   K 4.2 10/25/2017 0753   CL 107 12/16/2019 1138   CL 101 10/23/2012 0802   CO2 27 12/16/2019 1138   CO2 21 (L) 10/25/2017 0753   BUN 18 12/16/2019 1138   BUN 13.1 10/25/2017 0753   CREATININE 1.07 12/16/2019 1138   CREATININE 1.1 10/25/2017 0753      Component Value Date/Time   CALCIUM 9.3 12/16/2019 1138   CALCIUM 9.1 10/25/2017 0753   ALKPHOS 90 12/13/2019 0803   ALKPHOS 85 10/25/2017 0753   AST 54 (H) 12/16/2019 1138   AST 58 (H) 12/13/2019 0803   AST 17 10/25/2017 0753   ALT 105 (H) 12/16/2019 1138   ALT 135 (H) 12/13/2019 0803   ALT 19 10/25/2017 0753   BILITOT 0.6 12/16/2019 1138   BILITOT 1.0 12/13/2019 0803   BILITOT 0.48 10/25/2017 0753       Results for MONISH, HALIBURTON (MRN [id number]) as of 12/20/2019 13:32  Ref. Range 12/13/2019 08:03  M Protein SerPl Elph-Mcnc Latest Ref Range: Not Observed g/dL 0.2 (H)  IFE 1 Unknown Comment (A)  Globulin, Total Latest Ref Range: 2.2 - 3.9 g/dL 2.3  B-Globulin SerPl Elph-Mcnc Latest Ref Range: 0.7 - 1.3 g/dL 1.0  IgG (Immunoglobin G), Serum Latest Ref Range: 603 - 1,613 mg/dL 549 (L)  IgM (Immunoglobulin M), Srm Latest Ref Range: 15 - 143 mg/dL 14 (L)  IgA Latest Ref Range: 61 - 437 mg/dL 254    Results for EASON, HOUSMAN (MRN [id number]) as of 12/20/2019 13:32  Ref. Range 12/13/2019 08:03  Kappa free light chain Latest Ref Range: 3.3 - 19.4 mg/L 14.0  Lamda free light chains Latest Ref Range: 5.7 - 26.3 mg/L 15.8  Kappa, lamda light chain ratio Latest Ref Range: 0.26 - 1.65  0.89    Impression and Plan:   79 year old man with:  1.  IgA lambda MGUS without any evidence of symptomatic progression since 2004.  He remains on active surveillance.     Laboratory data from December 13, 2019 were personally reviewed and showed his protein studies are unchanged at  this time with an M spike that is around 0.2 g/dL which is consistent with the same numbers since his diagnosis.  The natural course of his disease was reviewed and risk of progression into multiple myeloma was discussed.  At this time this risk remains low without any evidence of progression.  I recommended continued active surveillance without any need for active treatments.   2. Diabetes mellitus: Followed by his primary care physician without any recent complications.  3. Follow-up: In 12 months for repeat follow-up.  20  minutes was dedicated to this  encounter today.  The time was spent on reviewing disease status, laboratory data review, treatment options and answering questions regarding future plan of care.  Zola Button, MD 1/29/20211:30 PM

## 2019-12-23 ENCOUNTER — Ambulatory Visit (INDEPENDENT_AMBULATORY_CARE_PROVIDER_SITE_OTHER): Payer: Medicare Other | Admitting: *Deleted

## 2019-12-23 ENCOUNTER — Telehealth: Payer: Self-pay | Admitting: Oncology

## 2019-12-23 DIAGNOSIS — I6930 Unspecified sequelae of cerebral infarction: Secondary | ICD-10-CM

## 2019-12-23 LAB — CUP PACEART REMOTE DEVICE CHECK
Date Time Interrogation Session: 20210201002754
Implantable Pulse Generator Implant Date: 20180907

## 2019-12-23 NOTE — Telephone Encounter (Signed)
Scheduled per 1/29 los. Called and spoke with pt, confirmed 2/11 and 2/18 appt

## 2019-12-24 ENCOUNTER — Other Ambulatory Visit: Payer: Self-pay

## 2019-12-24 ENCOUNTER — Ambulatory Visit: Payer: Medicare Other | Attending: Adult Health

## 2019-12-24 DIAGNOSIS — M6281 Muscle weakness (generalized): Secondary | ICD-10-CM | POA: Insufficient documentation

## 2019-12-24 DIAGNOSIS — R2681 Unsteadiness on feet: Secondary | ICD-10-CM | POA: Diagnosis not present

## 2019-12-24 DIAGNOSIS — R2689 Other abnormalities of gait and mobility: Secondary | ICD-10-CM | POA: Insufficient documentation

## 2019-12-24 NOTE — Progress Notes (Signed)
ILR Remote 

## 2019-12-24 NOTE — Patient Instructions (Signed)
Access Code: [email or web address] URL: [email or web address] Date: 12/24/2019 Prepared by: Cherly Anderson  Exercises Seated Hamstring Stretch - 3 reps - 1 sets - 30 sec hold - 2-3x daily - 7x weekly Standing Gastroc Stretch at Counter - 3 reps - 1 sets - 30 sec hold - 2-3x daily - 7x weekly Sit to Stand - 10 reps - 1 sets - 1x daily - 7x weekly Seated March - 10 reps - 1-2 sets - 1-2x daily - 5x weekly Seated Long Arc Quad - 10 reps - 1-2 sets - 1-2x daily - 5x weekly Seated Ankle Pumps on Table - 10 reps - 1-2 sets - 1-2x daily - 5x weekly Standing Knee Flexion AROM with Chair Support - 10 reps - 2 sets - 1x daily - 5x weekly Standing Hip Extension with Counter Support - 10 reps - 2 sets - 1x daily - 5x weekly Standing Hip Abduction with Counter Support - 10 reps - 2 sets - 1x daily - 5x weekly Walking March - 10 reps - 2 sets - 1x daily - 5x weekly

## 2019-12-24 NOTE — Therapy (Signed)
[facility] [address] [place], [place], [address] Phone: [phone]   Fax:  [phone]  Physical Therapy Treatment  Patient Details  Name: Earl [name] MRN: [id number] Date of Birth: 06-25-1941 Referring Provider (PT): Delice Lesch   Encounter Date: 12/24/2019  PT End of Session - 12/24/19 1318    Visit Number  6    Number of Visits  17    Date for PT Re-Evaluation  02/25/20   60 day poc but 90 day cert due to scheduling   Authorization Type  10th visit progress note    PT Start Time  1315    PT Stop Time  1359    PT Time Calculation (min)  44 min    Activity Tolerance  Patient tolerated treatment well    Behavior During Therapy  [facility] for tasks assessed/performed       Past Medical History:  Diagnosis Date  . Adenomatous colon polyp   . Allergy   . Anal fissure   . Anxiety   . Arthritis   . BPH (benign prostatic hyperplasia)   . Cataract   . Diabetes mellitus ([place])   . GERD (gastroesophageal reflux disease)   . Hyperlipidemia   . Hypertension   . Hypothyroidism   . Other testicular hypofunction   . Stroke ([place])   . Type II or unspecified type diabetes mellitus without mention of complication, not stated as uncontrolled     Past Surgical History:  Procedure Laterality Date  . CIRCUMCISION N/A 06/11/2019   Procedure: CIRCUMCISION ADULT;  Surgeon: [name], MD;  Location: [facility];  Service: Urology;  Laterality: N/A;  . COLONOSCOPY    . INGUINAL HERNIA REPAIR Bilateral    1968 and 2004  . LOOP RECORDER INSERTION N/A 07/28/2017   Procedure: LOOP RECORDER INSERTION;  Surgeon: Deboraha Sprang, MD;  Location: [facility];  Service: Cardiovascular;  Laterality: N/A;  . TEE WITHOUT CARDIOVERSION N/A 07/28/2017   Procedure: TRANSESOPHAGEAL ECHOCARDIOGRAM (TEE);  Surgeon: Sueanne Margarita, MD;  Location: [facility] ENDOSCOPY;  Service: Cardiovascular;  Laterality: N/A;    There were no vitals filed  for this visit.  Subjective Assessment - 12/24/19 1317    Subjective  Pt reports he is doing well.    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    Currently in Pain?  No/denies    Pain Onset  More than a month ago                       [facility] Adult PT Treatment/Exercise - 12/24/19 1318      Transfers   Transfers  Sit to Stand;Stand to Sit    Sit to Stand  5: Supervision    Stand to Sit  5: Supervision      Ambulation/Gait   Ambulation/Gait  Yes    Ambulation/Gait Assistance  5: Supervision    Ambulation/Gait Assistance Details  Verbal cues to try to increase right step length and heel strike    Ambulation Distance (Feet)  230 Feet    Assistive device  Straight cane    Gait Pattern  Step-through pattern;Decreased hip/knee flexion - right;Decreased dorsiflexion - right    Ambulation Surface  Level;Indoor      Exercises   Exercises  Other Exercises    Other Exercises  Standing at bars: Right hamstring curl 10 x 2, right hip ext 10 x 2, right hip abd 10 x 2 all with 3# ankle weights. After seated rest break pt performed step-ups on 4" step with right LE x 10 with 1 UE support then with lateral step up. Sit to stands from mat without UE support x 10. Pt reported being tired with exercises and did need a couple seated breaks.      Knee/Hip Exercises: Aerobic   Other Aerobic  Sci-Fit x 6 min level 2 to warm up at beginning.. HR=110 after             PT Education - 12/24/19 1453    Education Details  PT added standing exercises to HEP. Instructed not to perform along with all the seated ones as well. To alternate between the two.    Person(s) Educated  Patient    Methods  Explanation;Demonstration;Handout    Comprehension  Verbalized understanding;Returned demonstration       PT Short Term Goals - 11/27/19 0815      PT SHORT TERM GOAL #1    Title  Pt will be independent with HEP for improved balance, strength and gait.    Time  4    Period  Weeks    Status  New    Target Date  12/27/19      PT SHORT TERM GOAL #2   Title  Pt will increase 30 sec sit to stand from 6 to 8 or more for improved functional strength and mobility.    Baseline  6 on 11/26/2019    Time  4    Period  Weeks    Status  New    Target Date  12/27/19      PT SHORT TERM GOAL #3   Title  Pt will ambulate >500' on varied surfaces with SPC mod I for improved community mobility and activity tolerance.    Time  4    Period  Weeks    Status  New    Target Date  12/27/19        PT Long Term Goals - 11/27/19 0818      PT LONG TERM GOAL #1   Title  Pt will increase Berg Balance score from 44/56 to >47/56 for improved balance and decreased fall risk.    Baseline  44/56 on 11/26/2019    Time  8    Period  Weeks    Status  New    Target Date  01/25/20      PT LONG TERM GOAL #2   Title  Pt will increase gait speed from 0.3m/s to >0.20m/s for improved gait safety in the community.    Baseline  0.74m/s on 11/26/2019    Time  8    Period  Weeks    Status  New    Target Date  01/25/20      PT LONG TERM GOAL #3   Title  Pt will ambulate >1000' with SPC versus no device on all surfaces for improved gait safety.    Time  8    Period  Weeks    Status  New    Target Date  01/25/20            Plan - 12/24/19 1454    Clinical Impression Statement  PT focused more on strengthening for right LE in session today. Pt did need a couple seated breaks to break up activities. Pt unsteady with gait at  times especially when turning.    Personal Factors and Comorbidities  Comorbidity 3+    Comorbidities  DM, HTN, afib with loop recorder    Examination-Activity Limitations  Locomotion Level    Examination-Participation Restrictions  Yard Work;Community Activity    Stability/Clinical Decision Making  Evolving/Moderate complexity    Rehab Potential  Good    PT  Frequency  2x / week   plus eval   PT Duration  8 weeks    PT Treatment/Interventions  ADLs/Self Care Home Management;Gait training;Stair training;Functional mobility training;Therapeutic activities;Therapeutic exercise;Patient/family education;Neuromuscular re-education;Balance training;Manual techniques    PT Next Visit Plan  STG check due next visit. Continue to work on standing exercises for weightshifting, strength, balance, and add to HEP as needed.  Gait training with improved RLE step length and RLE stance time. Work more on gait with turning.    Consulted and Agree with Plan of Care  Patient       Patient will benefit from skilled therapeutic intervention in order to improve the following deficits and impairments:  Abnormal gait, Decreased activity tolerance, Decreased balance, Decreased mobility, Decreased strength  Visit Diagnosis: Muscle weakness (generalized)  Other abnormalities of gait and mobility     Problem List Patient Active Problem List   Diagnosis Date Noted  . 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 Acquired thrombophilia ([place]) 09/09/2019  . Abdominal aortic atherosclerosis ([place]) 09/09/2019  . Late effect of cerebrovascular accident (CVA) 03/28/2019  . CKD stage 2 due to type 2 diabetes mellitus ([place]) 12/20/2017  . Chronic anticoagulation 09/04/2017  . Labile blood glucose   . Hypoalbuminemia due to protein-calorie malnutrition ([place])   . Ataxia, post-stroke   . History of CVA with residual deficit   . Hypothyroidism 09/13/2016  . Medicare annual wellness visit, subsequent 08/28/2015  . Vitamin D deficiency 04/29/2014  . Medication management 01/03/2014  . Type 2 diabetes mellitus ([place])   . BPH (benign prostatic hyperplasia)   . 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 Essential hypertension 11/07/2013  . Hyperlipidemia associated with type 2 diabetes mellitus ([place]) 11/07/2013  . Paroxysmal atrial fibrillation ([place]) 09/18/2012     Electa Sniff, PT, DPT, NCS 12/24/2019, 2:57 PM  [facility] [address] [place], [place], [address] Phone: [phone]   Fax:  [phone]  Name: Earl [name] MRN: [id number] Date of Birth: 1941-04-01

## 2019-12-26 ENCOUNTER — Ambulatory Visit: Payer: Medicare Other | Admitting: Physical Therapy

## 2019-12-26 ENCOUNTER — Other Ambulatory Visit: Payer: Self-pay

## 2019-12-26 DIAGNOSIS — R2681 Unsteadiness on feet: Secondary | ICD-10-CM | POA: Diagnosis not present

## 2019-12-26 DIAGNOSIS — M6281 Muscle weakness (generalized): Secondary | ICD-10-CM | POA: Diagnosis not present

## 2019-12-26 DIAGNOSIS — R2689 Other abnormalities of gait and mobility: Secondary | ICD-10-CM | POA: Diagnosis not present

## 2019-12-26 NOTE — Therapy (Signed)
[facility] [address] [place], [place], [address] Phone: [phone]   Fax:  [phone]  Physical Therapy Treatment  Patient Details  Name: Earl [name] MRN: [id number] Date of Birth: Sep 25, 1941 Referring Provider (PT): Delice Lesch   Encounter Date: 12/26/2019  PT End of Session - 12/26/19 2018    Visit Number  7    Number of Visits  17    Date for PT Re-Evaluation  02/25/20   60 day poc but 90 day cert due to scheduling   Authorization Type  10th visit progress note    PT Start Time  1018    PT Stop Time  1059    PT Time Calculation (min)  41 min    Activity Tolerance  Patient tolerated treatment well    Behavior During Therapy  [facility] for tasks assessed/performed       Past Medical History:  Diagnosis Date  . Adenomatous colon polyp   . Allergy   . Anal fissure   . Anxiety   . Arthritis   . BPH (benign prostatic hyperplasia)   . Cataract   . Diabetes mellitus ([place])   . GERD (gastroesophageal reflux disease)   . Hyperlipidemia   . Hypertension   . Hypothyroidism   . Other testicular hypofunction   . Stroke ([place])   . Type II or unspecified type diabetes mellitus without mention of complication, not stated as uncontrolled     Past Surgical History:  Procedure Laterality Date  . CIRCUMCISION N/A 06/11/2019   Procedure: CIRCUMCISION ADULT;  Surgeon: [name], MD;  Location: [facility];  Service: Urology;  Laterality: N/A;  . COLONOSCOPY    . INGUINAL HERNIA REPAIR Bilateral    1968 and 2004  . LOOP RECORDER INSERTION N/A 07/28/2017   Procedure: LOOP RECORDER INSERTION;  Surgeon: Deboraha Sprang, MD;  Location: [facility] CV LAB;  Service: Cardiovascular;  Laterality: N/A;  . TEE WITHOUT CARDIOVERSION N/A 07/28/2017   Procedure: TRANSESOPHAGEAL ECHOCARDIOGRAM (TEE);  Surgeon: Sueanne Margarita, MD;  Location: [facility] ENDOSCOPY;  Service: Cardiovascular;  Laterality: N/A;    There were no vitals filed  for this visit.  Subjective Assessment - 12/26/19 1020    Subjective  No changes, no pain.  No falls.  Have not had a chance to do the exercises.    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    Currently in Pain?  No/denies    Pain Onset  More than a month ago                       [facility] Adult PT Treatment/Exercise - 12/26/19 1035      Transfers   Transfers  Sit to Stand;Stand to Sit    Sit to Stand  5: Supervision;Without upper extremity assist;From chair/3-in-1   hands on knees   Five time sit to stand comments   18.9   from chair, arms crossed    Stand to Sit  5: Supervision;Without upper extremity assist;To chair/3-in-1    Comments  30 sec sit to stand:  7 reps, with 2 reps of needing to sit back down, unable to achieve full stand      Ambulation/Gait   Ambulation/Gait  Yes    Ambulation/Gait Assistance  5: Supervision;Other (comment);4: Min guard   Min guard on  compliant mat surfaces   Ambulation/Gait Assistance Details  Verbal cues to increase RLE heelstrike, step length.  VCs to increase foot clearance on compliant mat surfaces    Ambulation Distance (Feet)  500 Feet    Assistive device  Straight cane    Gait Pattern  Step-through pattern;Decreased hip/knee flexion - right;Decreased dorsiflexion - right    Ambulation Surface  Level;Indoor    Ramp  5: Supervision    Ramp Details (indicate cue type and reason)  Negotiated ramp x 3 reps, with cane and supervision; cues for posture, step length.      Exercises   Exercises  Other Exercises    Other Exercises   Standing at counter:  Review of HEP given last visit, performed 1 set x 10 reps no resistance:  hip abduction, hip extension, hamstring curls, marching in place and forward march.  Second 1 set x 10 of the above exercises, with 2# weight each leg.  Cues for slowed pace/eccentric control  throughout.  Forward single limb step ups to 6" step, 1 UE support, cues for 3 sec hold.      Verbally reviewed seated HEP-pt reports performing seated exercises daily. Step taps, consecutive reps on RLE x 10, LLE x 10, 1 UE support.         PT Short Term Goals - 12/26/19 1033      PT SHORT TERM GOAL #1   Title  Pt will be independent with HEP for improved balance, strength and gait.    Time  4    Period  Weeks    Status  Achieved    Target Date  12/27/19      PT SHORT TERM GOAL #2   Title  Pt will increase 30 sec sit to stand from 6 to 8 or more for improved functional strength and mobility.    Baseline  6 on 11/26/2019; 7 times 12/26/2019    Time  4    Period  Weeks    Status  Not Met    Target Date  12/27/19      PT SHORT TERM GOAL #3   Title  Pt will ambulate >500' on varied surfaces with SPC mod I for improved community mobility and activity tolerance.    Baseline  500 ft supervision/min guard (compliant surfaces) with cane    Time  4    Period  Weeks    Status  Partially Met    Target Date  12/27/19        PT Long Term Goals - 11/27/19 0818      PT LONG TERM GOAL #1   Title  Pt will increase Berg Balance score from 44/56 to >47/56 for improved balance and decreased fall risk.    Baseline  44/56 on 11/26/2019    Time  8    Period  Weeks    Status  New    Target Date  01/25/20      PT LONG TERM GOAL #2   Title  Pt will increase gait speed from 0.4ms to >0.791m for improved gait safety in the community.    Baseline  0.5317mon 11/26/2019    Time  8    Period  Weeks    Status  New    Target Date  01/25/20      PT LONG TERM GOAL #3   Title  Pt will ambulate >1000' with SPC versus no device on all surfaces for improved gait safety.    Time  8    Period  Weeks    Status  New    Target Date  01/25/20            Plan - 12/26/19 2019    Clinical Impression Statement  Assessed STGs this visit, with STG 1 met for HEP.  STG 2 not met, but improved by one  rep from 6>7 in 30 second sit to stand.  STG 3 partially met, with pt meeting gait distance of 500 ft (simulated outdoor surfaces today due to cold temps outside), but needed supervision/min guard (for compliant surfaces, especially last lap with fatigue).  Pt will continue to benefit from skilled PT to address strength, balance, and gait training for improved overall functional mobility, independence and decreased fall risk.    Personal Factors and Comorbidities  Comorbidity 3+    Comorbidities  DM, HTN, afib with loop recorder    Examination-Activity Limitations  Locomotion Level    Examination-Participation Restrictions  Yard Work;Community Activity    Stability/Clinical Decision Making  Evolving/Moderate complexity    Rehab Potential  Good    PT Frequency  2x / week   plus eval   PT Duration  8 weeks    PT Treatment/Interventions  ADLs/Self Care Home Management;Gait training;Stair training;Functional mobility training;Therapeutic activities;Therapeutic exercise;Patient/family education;Neuromuscular re-education;Balance training;Manual techniques    PT Next Visit Plan  Continue to work on standing exercises for weightshifting, strength, balance, and add to HEP as needed.  Gait training with improved RLE step length and RLE stance time. Work more on gait with turning.    Consulted and Agree with Plan of Care  Patient       Patient will benefit from skilled therapeutic intervention in order to improve the following deficits and impairments:  Abnormal gait, Decreased activity tolerance, Decreased balance, Decreased mobility, Decreased strength  Visit Diagnosis: Muscle weakness (generalized)  Other abnormalities of gait and mobility     Problem List Patient Active Problem List   Diagnosis Date Noted  . 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 Acquired thrombophilia ([place]) 09/09/2019  . Abdominal aortic atherosclerosis ([place]) 09/09/2019  . Late effect of cerebrovascular accident (CVA)  03/28/2019  . CKD stage 2 due to type 2 diabetes mellitus ([place]) 12/20/2017  . Chronic anticoagulation 09/04/2017  . Labile blood glucose   . Hypoalbuminemia due to protein-calorie malnutrition ([place])   . Ataxia, post-stroke   . History of CVA with residual deficit   . Hypothyroidism 09/13/2016  . Medicare annual wellness visit, subsequent 08/28/2015  . Vitamin D deficiency 04/29/2014  . Medication management 01/03/2014  . Type 2 diabetes mellitus ([place])   . BPH (benign prostatic hyperplasia)   . 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 Essential hypertension 11/07/2013  . Hyperlipidemia associated with type 2 diabetes mellitus ([place]) 11/07/2013  . Paroxysmal atrial fibrillation ([place]) 09/18/2012    Elka Satterfield W. 12/26/2019, 8:23 PM Frazier Butt., PT  [facility] [address] [place], [place], [address] Phone: [phone]   Fax:  [phone]  Name: Earl [name] MRN: [id number] Date of Birth: [birth year]

## 2019-12-31 ENCOUNTER — Other Ambulatory Visit: Payer: Self-pay

## 2019-12-31 ENCOUNTER — Encounter: Payer: Self-pay | Admitting: Physical Therapy

## 2019-12-31 ENCOUNTER — Ambulatory Visit: Payer: Medicare Other | Admitting: Physical Therapy

## 2019-12-31 DIAGNOSIS — R2681 Unsteadiness on feet: Secondary | ICD-10-CM | POA: Diagnosis not present

## 2019-12-31 DIAGNOSIS — R2689 Other abnormalities of gait and mobility: Secondary | ICD-10-CM

## 2019-12-31 DIAGNOSIS — M6281 Muscle weakness (generalized): Secondary | ICD-10-CM

## 2019-12-31 NOTE — Patient Instructions (Signed)
WALKING  Walking is a great form of exercise to increase your strength, endurance and overall fitness.  A walking program can help you start slowly and gradually build endurance as you go.  Everyone's ability is different, so each person's starting point will be different.  You do not have to follow them exactly.  The are just samples. You should simply find out what's right for you and stick to that program.   In the beginning, you'll start off walking 2-3 times a day for short distances.  As you get stronger, you'll be walking further at just 1-2 times per day.  A. You Can Walk For A Certain Length Of Time Each Day    Walk 2-3 minutes 3 times per day.  Increase 1-2 minutes every 4-5 days (3 times per day).  Work up to 8-10 minutes (1-2 times per day).   Example:   Day 1-2 2-3 minutes 3 times per day   Day 7-8 4-5 minutes 2-3 times per day   Day 13-14 7-8 minutes 1-2 times per day  B. You Can Walk For a Certain Distance Each Day     Distance can be substituted for time.    Example:   3 laps in your home   3 trips to the mailbox

## 2019-12-31 NOTE — Therapy (Signed)
[facility] [address] [place], [place], [address] Phone: [phone]   Fax:  [phone]  Physical Therapy Treatment  Patient Details  Name: Earl [name] MRN: [id number] Date of Birth: Sep 05, 1941 Referring Provider (PT): Delice Lesch   Encounter Date: 12/31/2019  PT End of Session - 12/31/19 1933    Visit Number  8    Number of Visits  17    Date for PT Re-Evaluation  02/25/20   60 day poc but 90 day cert due to scheduling   Authorization Type  10th visit progress note    PT Start Time  1020    PT Stop Time  1100    PT Time Calculation (min)  40 min    Activity Tolerance  Patient tolerated treatment well    Behavior During Therapy  [facility] for tasks assessed/performed       Past Medical History:  Diagnosis Date  . Adenomatous colon polyp   . Allergy   . Anal fissure   . Anxiety   . Arthritis   . BPH (benign prostatic hyperplasia)   . Cataract   . Diabetes mellitus ([place])   . GERD (gastroesophageal reflux disease)   . Hyperlipidemia   . Hypertension   . Hypothyroidism   . Other testicular hypofunction   . Stroke ([place])   . Type II or unspecified type diabetes mellitus without mention of complication, not stated as uncontrolled     Past Surgical History:  Procedure Laterality Date  . CIRCUMCISION N/A 06/11/2019   Procedure: CIRCUMCISION ADULT;  Surgeon: [name], MD;  Location: [facility];  Service: Urology;  Laterality: N/A;  . COLONOSCOPY    . INGUINAL HERNIA REPAIR Bilateral    1968 and 2004  . LOOP RECORDER INSERTION N/A 07/28/2017   Procedure: LOOP RECORDER INSERTION;  Surgeon: Deboraha Sprang, MD;  Location: [facility];  Service: Cardiovascular;  Laterality: N/A;  . TEE WITHOUT CARDIOVERSION N/A 07/28/2017   Procedure: TRANSESOPHAGEAL ECHOCARDIOGRAM (TEE);  Surgeon: Sueanne Margarita, MD;  Location: [facility] ENDOSCOPY;  Service: Cardiovascular;  Laterality: N/A;    There were no vitals filed  for this visit.  Subjective Assessment - 12/31/19 1022    Subjective  No pain, no changes since last visit.    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    Currently in Pain?  No/denies    Pain Onset  More than a month ago                       [facility] Adult PT Treatment/Exercise - 12/31/19 0001      Transfers   Transfers  Sit to Stand;Stand to Sit    Sit to Stand  5: Supervision;Without upper extremity assist;From bed   Hands on knees   Sit to Stand Details  Verbal cues for sequencing;Verbal cues for technique   Cues for increased forward lean   Stand to Sit  5: Supervision;Without upper extremity assist;To bed    Comments  Performed sit<>stand from mat surface, x 5 reps with feet shoulder width apart, then x 5 reps with RLE tucked posteriorly for improved RLE weightbearing, then x 5 reps with LLE placed on blue disk, for increased RLE weightbearing.  Cues for increased forward lean to improve ease of transfer.      Ambulation/Gait  Ambulation/Gait  Yes    Ambulation/Gait Assistance  5: Supervision    Ambulation/Gait Assistance Details  VCs for equal, even step length    Ambulation Distance (Feet)  345 Feet   80 ft, 230 ft   Assistive device  Straight cane    Gait Pattern  Step-through pattern;Decreased hip/knee flexion - right;Decreased dorsiflexion - right    Ambulation Surface  Level;Indoor    Gait Comments  Provided patient with information/discussed walking program for improved walking endurance at home; after 345 ft of gait, HR 113 bpm and O2 sats 99%      High Level Balance   High Level Balance Activities  Backward walking    High Level Balance Comments  Forward/back walking along counter, x 3 reps, cues for increased step length/heelstrike in forward direction.  Forward march along counter, 4 reps      Knee/Hip Exercises: Standing   Other  Standing Knee Exercises  Resisted sidestepping 3 reps along counter, green band; then added sidestep squats with green band resistance, 2 reps along counter      Ankle Exercises: Standing   Heel Raises  Both;10 reps   2 sets   Toe Raise  10 reps   2 sets   Other Standing Ankle Exercises  Alternating heel raises, 2 sets x 10 reps    Other Standing Ankle Exercises  STagger stance forward/back rocking 2 sets x 10 reps       Additional balance at counter:  Lateral weightshifting x 10 reps at counter.  Postural cues for decreased UE support.      PT Education - 12/31/19 1932    Education Details  Walking program    Person(s) Educated  Patient    Methods  Explanation;Demonstration;Handout    Comprehension  Verbalized understanding       PT Short Term Goals - 12/26/19 1033      PT SHORT TERM GOAL #1   Title  Pt will be independent with HEP for improved balance, strength and gait.    Time  4    Period  Weeks    Status  Achieved    Target Date  12/27/19      PT SHORT TERM GOAL #2   Title  Pt will increase 30 sec sit to stand from 6 to 8 or more for improved functional strength and mobility.    Baseline  6 on 11/26/2019; 7 times 12/26/2019    Time  4    Period  Weeks    Status  Not Met    Target Date  12/27/19      PT SHORT TERM GOAL #3   Title  Pt will ambulate >500' on varied surfaces with SPC mod I for improved community mobility and activity tolerance.    Baseline  500 ft supervision/min guard (compliant surfaces) with cane    Time  4    Period  Weeks    Status  Partially Met    Target Date  12/27/19        PT Long Term Goals - 11/27/19 0818      PT LONG TERM GOAL #1   Title  Pt will increase Berg Balance score from 44/56 to >47/56 for improved balance and decreased fall risk.    Baseline  44/56 on 11/26/2019    Time  8    Period  Weeks    Status  New    Target Date  01/25/20      PT LONG TERM GOAL #2  Title  Pt will increase gait speed from 0.73ms to >0.728m  for improved gait safety in the community.    Baseline  0.5373mon 11/26/2019    Time  8    Period  Weeks    Status  New    Target Date  01/25/20      PT LONG TERM GOAL #3   Title  Pt will ambulate >1000' with SPC versus no device on all surfaces for improved gait safety.    Time  8    Period  Weeks    Status  New    Target Date  01/25/20            Plan - 12/31/19 1933    Clinical Impression Statement  Continued to focus skilled PT session today on lower extremity strengthening and gait training for improved funcitonal mobility.  Pt fatigues with gait after approx 345 ft using cane.  Discussed and provided walking program for patient to incorporate into his HEP.  He will continue to benefit from skilled PT to address strength, balance, and gait for overall improved functional mobility.    Personal Factors and Comorbidities  Comorbidity 3+    Comorbidities  DM, HTN, afib with loop recorder    Examination-Activity Limitations  Locomotion Level    Examination-Participation Restrictions  Yard Work;Community Activity    Stability/Clinical Decision Making  Evolving/Moderate complexity    Rehab Potential  Good    PT Frequency  2x / week   plus eval   PT Duration  8 weeks    PT Treatment/Interventions  ADLs/Self Care Home Management;Gait training;Stair training;Functional mobility training;Therapeutic activities;Therapeutic exercise;Patient/family education;Neuromuscular re-education;Balance training;Manual techniques    PT Next Visit Plan  Standing exercises for strengthening, weigthshift, balance; gait training for improved RLE stance time, heelstrike; gait and turns.  Ask if pt has done walking program at home    Consulted and Agree with Plan of Care  Patient       Patient will benefit from skilled therapeutic intervention in order to improve the following deficits and impairments:  Abnormal gait, Decreased activity tolerance, Decreased balance, Decreased mobility, Decreased  strength  Visit Diagnosis: Muscle weakness (generalized)  Unsteadiness on feet  Other abnormalities of gait and mobility     Problem List Patient Active Problem List   Diagnosis Date Noted  . 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 Acquired thrombophilia ([place]CC[place]0/19/2020  . Abdominal aortic atherosclerosis ([place]CC[place]0/19/2020  . Late effect of cerebrovascular accident (CVA) 03/28/2019  . CKD stage 2 due to type 2 diabetes mellitus (HCCGulfport1/30/2019  . Chronic anticoagulation 09/04/2017  . Labile blood glucose   . Hypoalbuminemia due to protein-calorie malnutrition ([place]CC[place] . Ataxia, post-stroke   . History of CVA with residual deficit   . Hypothyroidism 09/13/2016  . Medicare annual wellness visit, subsequent 08/28/2015  . Vitamin D deficiency 04/29/2014  . Medication management 01/03/2014  . Type 2 diabetes mellitus ([place]CC[place] . BPH (benign prostatic hyperplasia)   . 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 Essential hypertension 11/07/2013  . Hyperlipidemia associated with type 2 diabetes mellitus (HCCPer[place]2/18/2014  . Paroxysmal atrial fibrillation (HCCWhit[place]0/29/2013    Sevin Langenbach W. 12/31/2019, 7:37 PM  MARFrazier ButtPT   [place] [place]one: [phone]:  [phone]: NatJori ThrallN: [id number] of Birth: [birth year]

## 2020-01-01 ENCOUNTER — Ambulatory Visit: Payer: Medicare Other | Attending: Internal Medicine

## 2020-01-01 DIAGNOSIS — Z23 Encounter for immunization: Secondary | ICD-10-CM | POA: Insufficient documentation

## 2020-01-01 NOTE — Progress Notes (Signed)
   Covid-19 Vaccination Clinic  Name:  Earl [name]    MRN: [id number] DOB: [birth year]  01/01/2020  [name] was observed post Covid-19 immunization for 15 minutes without incidence. He was provided with Vaccine Information Sheet and instruction to access the V-Safe system.   [name] was instructed to call 911 with any severe reactions post vaccine: [name] Difficulty breathing  . Swelling of your face and throat  . A fast heartbeat  . A bad rash all over your body  . Dizziness and weakness    Immunizations Administered    Name Date Dose VIS Date Route   Pfizer COVID-19 Vaccine 01/01/2020 10:30 AM 0.3 mL 11/01/2019 Intramuscular   Manufacturer: [employer]   Lot: [id number]   [place]: [id number]

## 2020-01-02 ENCOUNTER — Ambulatory Visit: Payer: Medicare Other | Admitting: Physical Therapy

## 2020-01-02 ENCOUNTER — Other Ambulatory Visit: Payer: Self-pay

## 2020-01-02 DIAGNOSIS — R2689 Other abnormalities of gait and mobility: Secondary | ICD-10-CM

## 2020-01-02 DIAGNOSIS — M6281 Muscle weakness (generalized): Secondary | ICD-10-CM

## 2020-01-02 DIAGNOSIS — R2681 Unsteadiness on feet: Secondary | ICD-10-CM

## 2020-01-03 NOTE — Therapy (Signed)
[facility] [address] [place], [place], [address] Phone: [phone]   Fax:  [phone]  Physical Therapy Treatment  Patient Details  Name: Earl [name] MRN: [id number] Date of Birth: [birth year] Referring Provider (PT): Delice Lesch   Encounter Date: 01/02/2020  PT End of Session - 01/03/20 1448    Visit Number  9    Number of Visits  17    Date for PT Re-Evaluation  02/25/20   60 day poc but 90 day cert due to scheduling   Authorization Type  10th visit progress note    PT Start Time  1019    PT Stop Time  1101    PT Time Calculation (min)  42 min    Activity Tolerance  Patient tolerated treatment well    Behavior During Therapy  [facility] for tasks assessed/performed       Past Medical History:  Diagnosis Date  . Adenomatous colon polyp   . Allergy   . Anal fissure   . Anxiety   . Arthritis   . BPH (benign prostatic hyperplasia)   . Cataract   . Diabetes mellitus ([place])   . GERD (gastroesophageal reflux disease)   . Hyperlipidemia   . Hypertension   . Hypothyroidism   . Other testicular hypofunction   . Stroke ([place])   . Type II or unspecified type diabetes mellitus without mention of complication, not stated as uncontrolled     Past Surgical History:  Procedure Laterality Date  . CIRCUMCISION N/A 06/11/2019   Procedure: CIRCUMCISION ADULT;  Surgeon: [name], MD;  [facility];  Service: Urology;  Laterality: N/A;  . COLONOSCOPY    . INGUINAL HERNIA REPAIR Bilateral    1968 and 2004  . LOOP RECORDER INSERTION N/A 07/28/2017   Procedure: LOOP RECORDER INSERTION;  Surgeon: Deboraha Sprang, MD;  Location: [facility];  Service: Cardiovascular;  Laterality: N/A;  . TEE WITHOUT CARDIOVERSION N/A 07/28/2017   Procedure: TRANSESOPHAGEAL ECHOCARDIOGRAM (TEE);  Surgeon: Sueanne Margarita, MD;  Location: [facility] ENDOSCOPY;  Service: Cardiovascular;  Laterality: N/A;    There were no vitals filed  for this visit.  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    Currently in Pain?  No/denies    Pain Onset  More than a month ago                       [facility] Adult PT Treatment/Exercise - 01/02/20 1021      Transfers   Transfers  Sit to Stand;Stand to Sit    Sit to Stand  5: Supervision;Without upper extremity assist;From bed    Stand to Sit  5: Supervision;Without upper extremity assist;To bed    Comments  Performed sit<>stand from mat surface, x 10 reps with feet shoulder width apart, then x 10 reps with RLE tucked posteriorly for improved RLE weightbearing, then x 5 reps with BLEs on Airex cushion.  Cues for increased forward lean to improve ease of transfer.      Ambulation/Gait   Ambulation/Gait  Yes    Ambulation/Gait Assistance  5: Supervision    Ambulation/Gait Assistance Details  VCs for equal, even step length, especially with no device  Ambulation Distance (Feet)  35 Feet   x 6 reps; 60 ft x 2 no device; 315 ft with cane   Assistive device  Straight cane    Gait Pattern  Step-through pattern;Decreased hip/knee flexion - right;Decreased dorsiflexion - right    Ambulation Surface  Level;Indoor    Pre-Gait Activities  With short distance walking, practiced smooth turns for change of directions, with cues for lifting feet for smooth turn.    Gait Comments  Reviewed walking program at home- pt reports he has walked twice at home since last PT visit Tuesday.      High Level Balance   High Level Balance Activities  Backward walking   Fwd/back walk in parallel bars, x 3 reps   High Level Balance Comments  Forward march/backward walk x 3 reps in parallel bars.  Forward step over obstacle, 2 sets x 10 reps with UE support.  Heel walking x 4 reps in parallel  bars, toe walking x 4 reps, squat walking on heels x 4 reps in parallel bars      Neuro Re-ed    Neuro Re-ed Details   Single limb stance activities:  step taps forward>hip/knee extension x 10 reps each leg; single limb forward step ups x 10 reps        Heel/toe raises x 10 reps, bilateral, 2 sets. Minisquats at counter, x 10 reps         PT Short Term Goals - 12/26/19 1033      PT SHORT TERM GOAL #1   Title  Pt will be independent with HEP for improved balance, strength and gait.    Time  4    Period  Weeks    Status  Achieved    Target Date  12/27/19      PT SHORT TERM GOAL #2   Title  Pt will increase 30 sec sit to stand from 6 to 8 or more for improved functional strength and mobility.    Baseline  6 on 11/26/2019; 7 times 12/26/2019    Time  4    Period  Weeks    Status  Not Met    Target Date  12/27/19      PT SHORT TERM GOAL #3   Title  Pt will ambulate >500' on varied surfaces with SPC mod I for improved community mobility and activity tolerance.    Baseline  500 ft supervision/min guard (compliant surfaces) with cane    Time  4    Period  Weeks    Status  Partially Met    Target Date  12/27/19        PT Long Term Goals - 11/27/19 0818      PT LONG TERM GOAL #1   Title  Pt will increase Berg Balance score from 44/56 to >47/56 for improved balance and decreased fall risk.    Baseline  44/56 on 11/26/2019    Time  8    Period  Weeks    Status  New    Target Date  01/25/20      PT LONG TERM GOAL #2   Title  Pt will increase gait speed from 0.96ms to >0.796m for improved gait safety in the community.    Baseline  0.5353mon 11/26/2019    Time  8    Period  Weeks    Status  New    Target Date  01/25/20      PT LONG TERM GOAL #3   Title  Pt will ambulate >1000' with SPC versus no device on all surfaces for improved gait safety.    Time  8    Period  Weeks    Status  New    Target Date  01/25/20            Plan - 01/03/20 1448    Clinical  Impression Statement  Skilled PT focused on functional strengthening, balance, gait training, with and without cane.  Pt continues to demo fatigue with gait, and he does report that he has started walking at home for exercise.  He will continue to benefit from skilled PT to further address strength, balance, and gait towards LTGs.    Personal Factors and Comorbidities  Comorbidity 3+    Comorbidities  DM, HTN, afib with loop recorder    Examination-Activity Limitations  Locomotion Level    Examination-Participation Restrictions  Yard Work;Community Activity    Stability/Clinical Decision Making  Evolving/Moderate complexity    Rehab Potential  Good    PT Frequency  2x / week   plus eval   PT Duration  8 weeks    PT Treatment/Interventions  ADLs/Self Care Home Management;Gait training;Stair training;Functional mobility training;Therapeutic activities;Therapeutic exercise;Patient/family education;Neuromuscular re-education;Balance training;Manual techniques    PT Next Visit Plan  10th Visit progress report next visit; continue standing exercises for strengthening, weightshifting, balance, gait training, turns.  Ask about walking program.    Consulted and Agree with Plan of Care  Patient       Patient will benefit from skilled therapeutic intervention in order to improve the following deficits and impairments:  Abnormal gait, Decreased activity tolerance, Decreased balance, Decreased mobility, Decreased strength  Visit Diagnosis: Muscle weakness (generalized)  Unsteadiness on feet  Other abnormalities of gait and mobility     Problem List Patient Active Problem List   Diagnosis Date Noted  . 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 Acquired thrombophilia ([place]) 09/09/2019  . Abdominal aortic atherosclerosis ([place]) 09/09/2019  . Late effect of cerebrovascular accident (CVA) 03/28/2019  . CKD stage 2 due to type 2 diabetes mellitus ([place]) 12/20/2017  . Chronic anticoagulation 09/04/2017  .  Labile blood glucose   . Hypoalbuminemia due to protein-calorie malnutrition ([place])   . Ataxia, post-stroke   . History of CVA with residual deficit   . Hypothyroidism 09/13/2016  . Medicare annual wellness visit, subsequent 08/28/2015  . Vitamin D deficiency 04/29/2014  . Medication management 01/03/2014  . Type 2 diabetes mellitus ([place])   . BPH (benign prostatic hyperplasia)   . 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 Essential hypertension 11/07/2013  . Hyperlipidemia associated with type 2 diabetes mellitus ([place]) 11/07/2013  . Paroxysmal atrial fibrillation ([place]) 09/18/2012    Nastacia Raybuck W. 01/03/2020, 2:54 PM  Frazier Butt., PT   [facility] [address] [place], [place], [address] Phone: [phone]   Fax:  [phone]  Name: Abdulrahman Bracey MRN: [id number] Date of Birth: 05/16/1941

## 2020-01-06 ENCOUNTER — Ambulatory Visit: Payer: Medicare Other | Admitting: Physical Therapy

## 2020-01-09 ENCOUNTER — Ambulatory Visit: Payer: Medicare Other | Admitting: Physical Therapy

## 2020-01-10 ENCOUNTER — Other Ambulatory Visit: Payer: Self-pay | Admitting: Internal Medicine

## 2020-01-13 ENCOUNTER — Other Ambulatory Visit: Payer: Self-pay

## 2020-01-13 ENCOUNTER — Ambulatory Visit: Payer: Medicare Other | Admitting: Physical Therapy

## 2020-01-13 VITALS — BP 138/85 | HR 102

## 2020-01-13 DIAGNOSIS — M6281 Muscle weakness (generalized): Secondary | ICD-10-CM

## 2020-01-13 DIAGNOSIS — R2689 Other abnormalities of gait and mobility: Secondary | ICD-10-CM | POA: Diagnosis not present

## 2020-01-13 DIAGNOSIS — R2681 Unsteadiness on feet: Secondary | ICD-10-CM | POA: Diagnosis not present

## 2020-01-13 NOTE — Therapy (Signed)
[facility] [address] [place], [place], [address] Phone: [phone]   Fax:  [phone]  Physical Therapy Treatment/10th Visit Progress Note  Patient Details  Name: Earl [name] MRN: [id number] Date of Birth: [birth year] Referring Provider (PT): Delice Lesch   Encounter Date: 01/13/2020  PT End of Session - 01/13/20 1513    Visit Number  10    Number of Visits  17    Date for PT Re-Evaluation  02/25/20   60 day poc but 90 day cert due to scheduling   Authorization Type  10th visit progress note    PT Start Time  1018    PT Stop Time  1058    PT Time Calculation (min)  40 min    Activity Tolerance  Patient tolerated treatment well;Patient limited by fatigue    Behavior During Therapy  [facility] for tasks assessed/performed       Past Medical History:  Diagnosis Date  . Adenomatous colon polyp   . Allergy   . Anal fissure   . Anxiety   . Arthritis   . BPH (benign prostatic hyperplasia)   . Cataract   . Diabetes mellitus ([place])   . GERD (gastroesophageal reflux disease)   . Hyperlipidemia   . Hypertension   . Hypothyroidism   . Other testicular hypofunction   . Stroke ([place])   . Type II or unspecified type diabetes mellitus without mention of complication, not stated as uncontrolled     Past Surgical History:  Procedure Laterality Date  . CIRCUMCISION N/A 06/11/2019   Procedure: CIRCUMCISION ADULT;  Surgeon: [name], MD;  Location: [facility];  Service: Urology;  Laterality: N/A;  . COLONOSCOPY    . INGUINAL HERNIA REPAIR Bilateral    1968 and 2004  . LOOP RECORDER INSERTION N/A 07/28/2017   Procedure: LOOP RECORDER INSERTION;  Surgeon: Deboraha Sprang, MD;  Location: [facility];  Service: Cardiovascular;  Laterality: N/A;  . TEE WITHOUT CARDIOVERSION N/A 07/28/2017   Procedure: TRANSESOPHAGEAL ECHOCARDIOGRAM (TEE);  Surgeon: Sueanne Margarita, MD;  Location: [facility];  Service:  Cardiovascular;  Laterality: N/A;    Vitals:   01/13/20 1024  BP: 138/85  Pulse: (!) 102    Subjective Assessment - 01/13/20 1021    Subjective  Not feeling that great today.  Not sure why.    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    Currently in Pain?  No/denies    Pain Onset  More than a month ago                       [facility] Adult PT Treatment/Exercise - 01/13/20 1025      Transfers   Transfers  Sit to Stand;Stand to Sit    Sit to Stand  5: Supervision;Without upper extremity assist;From bed    Five time sit to stand comments   16.4   from chair, arms crossed   Stand to Sit  5: Supervision;Without upper extremity assist;To bed      Ambulation/Gait   Ambulation/Gait  Yes    Ambulation/Gait Assistance  5: Supervision    Ambulation Distance (Feet)  60 Feet   x 4   Assistive device  Straight cane    Gait Pattern  Step-through pattern;Decreased hip/knee flexion - right;Decreased dorsiflexion - right  Ambulation Surface  Level;Indoor    Gait velocity  20.78 sec = 1.58 ft/sec      Self-Care   Self-Care  Other Self-Care Comments    Other Self-Care Comments   Assessed vitals x 2 during session (2nd time 133/84), HR 99 after seated exercise.  Multiple times during session, O2 sats 100%, HR 100-112 bpm (even upon arriving and at rest during session)  Discussed pt's elevated HR at rest and possibly seeing MD regarding HR and not feeling well.      Knee/Hip Exercises: Seated   Long Arc Quad  Strengthening;Right;Left;1 set;10 reps   red theraband   Other Seated Knee/Hip Exercises  Seated hip abduction stepping out, 10 reps each side    Other Seated Knee/Hip Exercises  Seated heel/toe raises, 2 sets x 10 reps no resistance, then 2 sets x 10 reps red theraband    Marching  Strengthening;Right;Left;1 set;10 reps   red theraband   Sit to Sand  1  set;5 reps;without UE support   from mat            PT Education - 01/13/20 1513    Education Details  See self-care    Person(s) Educated  Patient    Methods  Explanation    Comprehension  Verbalized understanding       PT Short Term Goals - 12/26/19 1033      PT SHORT TERM GOAL #1   Title  Pt will be independent with HEP for improved balance, strength and gait.    Time  4    Period  Weeks    Status  Achieved    Target Date  12/27/19      PT SHORT TERM GOAL #2   Title  Pt will increase 30 sec sit to stand from 6 to 8 or more for improved functional strength and mobility.    Baseline  6 on 11/26/2019; 7 times 12/26/2019    Time  4    Period  Weeks    Status  Not Met    Target Date  12/27/19      PT SHORT TERM GOAL #3   Title  Pt will ambulate >500' on varied surfaces with SPC mod I for improved community mobility and activity tolerance.    Baseline  500 ft supervision/min guard (compliant surfaces) with cane    Time  4    Period  Weeks    Status  Partially Met    Target Date  12/27/19        PT Long Term Goals - 11/27/19 0818      PT LONG TERM GOAL #1   Title  Pt will increase Berg Balance score from 44/56 to >47/56 for improved balance and decreased fall risk.    Baseline  44/56 on 11/26/2019    Time  8    Period  Weeks    Status  New    Target Date  01/25/20      PT LONG TERM GOAL #2   Title  Pt will increase gait speed from 0.59ms to >0.752m for improved gait safety in the community.    Baseline  0.5328mon 11/26/2019    Time  8    Period  Weeks    Status  New    Target Date  01/25/20      PT LONG TERM GOAL #3   Title  Pt will ambulate >1000' with SPC versus no device on all surfaces for improved gait safety.  Time  8    Period  Weeks    Status  New    Target Date  01/25/20            Plan - 01/13/20 1514    Clinical Impression Statement  10th Visit Progress Note, 11/26/2019-01/13/2020:  Objective measures:  5x sit<>stand 16.4 sec (improved  from 18.69 sec on 12/26/19), gait velocity today 1.58 ft/sec (slowed from eval).  When STGs checked several weeks ago, pt met 1 STG, partially met 1 STG (for gait distance) and did not met 1 STG (for 30 second sit to stand).  Pt participates well in therapy sessions and has HEP for home to address strength, balance.  However, pt generally not feeling well today and his HR at rest and with activity is elevated.  After initial 2 measures noted above, focused on seated lower extremity stregnthning and encouraged pt to follow up with physician.  Pt will continue to benefit from further skilled PT to further address strength, balance,a nd gait towards LTGS for improved overall functional mobility.    Personal Factors and Comorbidities  Comorbidity 3+    Comorbidities  DM, HTN, afib with loop recorder    Examination-Activity Limitations  Locomotion Level    Examination-Participation Restrictions  Yard Work;Community Activity    Stability/Clinical Decision Making  Evolving/Moderate complexity    Rehab Potential  Good    PT Frequency  2x / week   plus eval   PT Duration  8 weeks    PT Treatment/Interventions  ADLs/Self Care Home Management;Gait training;Stair training;Functional mobility training;Therapeutic activities;Therapeutic exercise;Patient/family education;Neuromuscular re-education;Balance training;Manual techniques    PT Next Visit Plan  continue standing exercises for strengthening, weightshifting, balance, gait training, turns.  Ask about walking program.    Consulted and Agree with Plan of Care  Patient       Patient will benefit from skilled therapeutic intervention in order to improve the following deficits and impairments:  Abnormal gait, Decreased activity tolerance, Decreased balance, Decreased mobility, Decreased strength  Visit Diagnosis: Muscle weakness (generalized)  Other abnormalities of gait and mobility     Problem List Patient Active Problem List   Diagnosis Date Noted   . 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 Acquired thrombophilia ([place]) 09/09/2019  . Abdominal aortic atherosclerosis ([place]) 09/09/2019  . Late effect of cerebrovascular accident (CVA) 03/28/2019  . CKD stage 2 due to type 2 diabetes mellitus ([place]) 12/20/2017  . Chronic anticoagulation 09/04/2017  . Labile blood glucose   . Hypoalbuminemia due to protein-calorie malnutrition ([place])   . Ataxia, post-stroke   . History of CVA with residual deficit   . Hypothyroidism 09/13/2016  . Medicare annual wellness visit, subsequent 08/28/2015  . Vitamin D deficiency 04/29/2014  . Medication management 01/03/2014  . Type 2 diabetes mellitus ([place])   . BPH (benign prostatic hyperplasia)   . 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 Essential hypertension 11/07/2013  . Hyperlipidemia associated with type 2 diabetes mellitus ([place]) 11/07/2013  . Paroxysmal atrial fibrillation ([place]) 09/18/2012    Averey Trompeter W. 01/13/2020, 3:21 PM  Frazier Butt., PT   [facility] [address] [place], [place], [address] Phone: [phone]   Fax:  [phone]  Name: Earl [name] MRN: [id number] Date of Birth: August 17, 1941

## 2020-01-14 ENCOUNTER — Encounter: Payer: Self-pay | Admitting: Adult Health Nurse Practitioner

## 2020-01-14 ENCOUNTER — Ambulatory Visit (INDEPENDENT_AMBULATORY_CARE_PROVIDER_SITE_OTHER): Payer: Medicare Other | Admitting: Adult Health Nurse Practitioner

## 2020-01-14 ENCOUNTER — Ambulatory Visit
Admission: RE | Admit: 2020-01-14 | Discharge: 2020-01-14 | Disposition: A | Discharge status: Home / Self Care | Payer: Medicare Other | Source: Ambulatory Visit | Attending: Adult Health Nurse Practitioner | Admitting: Adult Health Nurse Practitioner

## 2020-01-14 VITALS — BP 140/80 | HR 100 | Temp 97.6°F | Ht 72.0 in | Wt 186.0 lb

## 2020-01-14 DIAGNOSIS — R748 Abnormal levels of other serum enzymes: Secondary | ICD-10-CM

## 2020-01-14 DIAGNOSIS — R140 Abdominal distension (gaseous): Secondary | ICD-10-CM | POA: Diagnosis not present

## 2020-01-14 DIAGNOSIS — R0602 Shortness of breath: Secondary | ICD-10-CM

## 2020-01-14 DIAGNOSIS — K5901 Slow transit constipation: Secondary | ICD-10-CM | POA: Diagnosis not present

## 2020-01-14 DIAGNOSIS — R7989 Other specified abnormal findings of blood chemistry: Secondary | ICD-10-CM

## 2020-01-14 NOTE — Progress Notes (Signed)
Assessment and Plan:  There are no diagnoses linked to this encounter.  Edinson was seen today for acute visit, sinus problem, tachycardia and edema.  Diagnoses and all orders for this visit:  Shortness of breath Concern for increased fluid causing shortness of breath/fatigue r/u pneumonia -     DG Chest 2 View; Future -     CBC with Differential/Platelet -     COMPLETE METABOLIC PANEL WITH GFR -     Magnesium  Slow transit constipation Increase fiber in diet and continue prune juice Increase walking.  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 In contact with daughter [name] via [place].  Communication provided regarding next step in diagnosis and care (ie reduce fluid & ultrasound) r/u liver involvement ascites?    Further disposition pending results of labs. Discussed med's effects and SE's.   Over 30 minutes of interview exam, counseling, chart review, and critical decision making was performed.   Future Appointments  Date Time Provider [place]  01/14/2020  3:00 PM Garnet Sierras, NP [facility] None  01/16/2020 11:00 AM Frazier Butt, PT [facility]  01/20/2020 10:15 AM Frazier Butt, PT [facility]  01/23/2020  8:40 AM [facility] DEVICE REMOTES [facility]  01/23/2020 10:15 AM Frazier Butt, PT [facility]  01/27/2020 10:15 AM Frazier Butt, PT [facility]  01/29/2020 10:15 AM Frann Rider, NP [facility] None  01/30/2020 10:15 AM Frazier Butt, PT [facility]  02/24/2020  9:15 AM [facility] DEVICE REMOTES [facility]  03/19/2020 10:45 AM Garnet Sierras, NP [facility] None  03/26/2020  8:40 AM [facility] DEVICE REMOTES [facility]  03/31/2020 11:15 AM Garnet Sierras, NP [facility] None  04/27/2020  8:40 AM [facility] DEVICE  REMOTES [facility]  05/12/2020 10:20 AM Jamse Arn, MD [facility] CPR  05/28/2020  8:40 AM [facility] DEVICE REMOTES [facility]  06/29/2020  9:05 AM [facility] DEVICE REMOTES [facility]  07/13/2020 10:00 AM [name], MD [facility] None  07/30/2020  8:40 AM [facility] DEVICE REMOTES [facility]  08/31/2020  8:40 AM [facility] DEVICE REMOTES [facility]  10/01/2020  8:50 AM [facility] DEVICE REMOTES [facility]  11/02/2020  8:40 AM [facility] DEVICE REMOTES [facility]  01/01/2021 10:00 AM [facility] LAB 1 [facility] None  01/08/2021  9:30 AM Shadad, Mathis Dad, MD [facility] None    ------------------------------------------------------------------------------------------------------------------   HPI 79 y.o.male presents for evaluation of shortness of breath.  He reports a dry cough that does increase with drinking, eating.  He had swallow evaluation on 12/05/19 with some deep tracheal, silent aspiration and recommendations to chin tuck when swallowing and remain up right after meals as well as speech therapy.  Patient endorses shortness of breath that increase with exertion.  He also reports orthopnea and PND, which are new for the patient.  He endorses improvement with rest but he has to rest after simple activities such as walking from car into our office.  He also reports a significant increase in swelling of his bilaterally lower extremities.  He reports he has had edema in past R>L but wearing compression stockings a after resting at night it typically resolves.  This has not been resolving over night. Patient also reports he was battling with consitpation.  He drink prune juice to help with this.  Reports he has had two , formed, bowl  movements over the past two days.  He reports he abdomen fills full.  Patient has atria fibrillation and on Eliquis, HTN and sinus tachycardia.  He is not  on blood pressure medication at this time.  Was was on lisinopril which was discontinued related to episodes of "woozieness". Report this went away after stopping but has since intermittently returned and not able to associate this with any activities or time of day.  He does not monitor his blood pressure at home.  He denies any chest pains, palpatations.  TEE 07/28/2017 - Moderate LVH and EF 55% to 60%.  MDT ILR: impanted 07/28/2017  Past Medical History:  Diagnosis Date  . Adenomatous colon polyp   . Allergy   . Anal fissure   . Anxiety   . Arthritis   . BPH (benign prostatic hyperplasia)   . Cataract   . Diabetes mellitus ([place])   . GERD (gastroesophageal reflux disease)   . Hyperlipidemia   . Hypertension   . Hypothyroidism   . Other testicular hypofunction   . Stroke ([place])   . Type II or unspecified type diabetes mellitus without mention of complication, not stated as uncontrolled      Allergies  Allergen Reactions  . Penicillins Other (See Comments)    Current Outpatient Medications on File Prior to Visit  Medication Sig  . ALPRAZolam (XANAX) 1 MG tablet Take 1/2-1 tablet at Bedtime  ONLY if needed for Sleep &  limit to 5 days /week to avoid Addiction & Dementia  . apixaban (ELIQUIS) 5 MG TABS tablet Take 1 tablet 2 x /day to Prevent Blood Clots  . atorvastatin (LIPITOR) 40 MG tablet Take 1 tablet at bedtime for cholestaerol.  . Blood Glucose Monitoring Suppl (ONE TOUCH ULTRA 2) w/Device KIT Check blood sugar 1 time a day-DX-E11.9  . Cholecalciferol (VITAMIN D PO) Take 1 tablet by mouth daily.   . Cyanocobalamin (B-12) 50 MCG TABS Take 50 mcg by mouth daily.  . finasteride (PROSCAR) 5 MG tablet Take 1 tablet Daily for Prostate  . gabapentin (NEURONTIN) 100 MG capsule Take 1 to 3 capsules 1 hour before Bedtime for Diabetic Neuropathy Pains  . glipiZIDE (GLUCOTROL) 5 MG tablet Take 1 tablet 2 x /day with Meals for Diabetes  . glucose blood (ONETOUCH ULTRA) test strip  Check blood sugar 1 time a day-DX-E11.9  . Lancets Misc. (ONE TOUCH SURESOFT) MISC Check blood sugar 1 time a day-DX-E11.91  . levothyroxine (SYNTHROID) 50 MCG tablet Take 1 tablet daily on an empty stomach with only water for 30 minutes & no Antacid meds, Calcium or Magnesium for 4 hours & avoid Biotin  . Magnesium 250 MG TABS Take 250 mg by mouth daily.   . metFORMIN (GLUCOPHAGE-XR) 500 MG 24 hr tablet Take 2 tablets 2 x  /day with Meals for Diabetes  . Multiple Vitamin (MULTIVITAMIN) capsule Take 1 capsule by mouth daily.    [name] omeprazole (PRILOSEC) 40 MG capsule Take 1 capsule (40 mg total) by mouth daily.  . vitamin C (ASCORBIC ACID) 500 MG tablet Take 500 mg by mouth daily.     No current facility-administered medications on file prior to visit.    ROS: all negative except above.   Physical Exam:  There were no vitals taken for this visit.  General Appearance: Well nourished, in no apparent distress. Eyes: PERRLA, EOMs, conjunctiva no swelling or erythema Sinuses: No Frontal/maxillary tenderness ENT/Mouth: Ext aud canals clear, TMs without erythema, bulging. No erythema, swelling, or exudate on  post pharynx.  Tonsils not swollen or erythematous. Hearing normal.  Neck: Supple, thyroid normal.  Respiratory: Respiratory effort normal, BS equal bilaterally without rales, rhonchi, wheezing or stridor. Cardio: Irregular heart beat with no MRGs.  Peripheral pulses +1, +2 pitting edema wearing compression stockings..  Abdomen: Soft but distended, + BS.  Non tender, no guarding, rebound, hernias, masses. Lymphatics: Non tender without lymphadenopathy.  Musculoskeletal: Full ROM, 5/5 strength, normal gait.  Skin: Warm, dry without rashes, lesions, ecchymosis.  Neuro: Cranial nerves intact. Normal muscle tone, no cerebellar symptoms. Sensation intact.  Psych: Awake and oriented X 3, normal affect, Insight and Judgment appropriate.     Garnet Sierras, NP 2:33 PM [facility]

## 2020-01-14 NOTE — Patient Instructions (Addendum)
   We would like for you to go get a chest xray at [facility] today.  You do not need an order.     We will contact you with your lab results by phone.  We will also add comments on MyChart.   We are going to sent in Lasix for you take Lasix (Furosemide) 20mg  once a day.  Please contact office with any new or worsening symptoms

## 2020-01-15 ENCOUNTER — Other Ambulatory Visit: Payer: Self-pay

## 2020-01-15 LAB — COMPLETE METABOLIC PANEL WITH GFR
AG Ratio: 1.9 (calc) (ref 1.0–2.5)
ALT: 149 U/L — ABNORMAL HIGH (ref 9–46)
AST: 65 U/L — ABNORMAL HIGH (ref 10–35)
Albumin: 3.7 g/dL (ref 3.6–5.1)
Alkaline phosphatase (APISO): 106 U/L (ref 35–144)
BUN/Creatinine Ratio: 16 (calc) (ref 6–22)
BUN: 20 mg/dL (ref 7–25)
CO2: 25 mmol/L (ref 20–32)
Calcium: 9.2 mg/dL (ref 8.6–10.3)
Chloride: 108 mmol/L (ref 98–110)
Creat: 1.28 mg/dL — ABNORMAL HIGH (ref 0.70–1.18)
GFR, Est African American: 62 mL/min/{1.73_m2} (ref >=60)
GFR, Est Non African American: 53 mL/min/{1.73_m2} — ABNORMAL LOW (ref >=60)
Globulin: 2 g/dL (calc) (ref 1.9–3.7)
Glucose, Bld: 175 mg/dL — ABNORMAL HIGH (ref 65–99)
Potassium: 4.8 mmol/L (ref 3.5–5.3)
Sodium: 140 mmol/L (ref 135–146)
Total Bilirubin: 0.7 mg/dL (ref 0.2–1.2)
Total Protein: 5.7 g/dL — ABNORMAL LOW (ref 6.1–8.1)

## 2020-01-15 LAB — CBC WITH DIFFERENTIAL/PLATELET
Absolute Monocytes: 599 cells/uL (ref 200–950)
Basophils Absolute: 50 cells/uL (ref 0–200)
Basophils Relative: 0.9 %
Eosinophils Absolute: 73 cells/uL (ref 15–500)
Eosinophils Relative: 1.3 %
HCT: 36.9 % — ABNORMAL LOW (ref 38.5–50.0)
Hemoglobin: 12.5 g/dL — ABNORMAL LOW (ref 13.2–17.1)
Lymphs Abs: 1932 cells/uL (ref 850–3900)
MCH: 29 pg (ref 27.0–33.0)
MCHC: 33.9 g/dL (ref 32.0–36.0)
MCV: 85.6 fL (ref 80.0–100.0)
MPV: 11.6 fL (ref 7.5–12.5)
Monocytes Relative: 10.7 %
Neutro Abs: 2946 cells/uL (ref 1500–7800)
Neutrophils Relative %: 52.6 %
Platelets: 216 10*3/uL (ref 140–400)
RBC: 4.31 10*6/uL (ref 4.20–5.80)
RDW: 13.6 % (ref 11.0–15.0)
Total Lymphocyte: 34.5 %
WBC: 5.6 10*3/uL (ref 3.8–10.8)

## 2020-01-15 LAB — MAGNESIUM: Magnesium: 2 mg/dL (ref 1.5–2.5)

## 2020-01-15 MED ORDER — FUROSEMIDE 20 MG PO TABS: 20.0000 mg | ORAL_TABLET | Freq: Every day | ORAL | 2 refills | Status: DC

## 2020-01-15 NOTE — Telephone Encounter (Signed)
Patient states that the prescription for Lasix was not sent into the pharmacy.  Per [facility], patient is to start taking Lasix, 20mg , once daily.

## 2020-01-16 ENCOUNTER — Other Ambulatory Visit: Payer: Self-pay

## 2020-01-16 ENCOUNTER — Encounter: Payer: Self-pay | Admitting: Adult Health Nurse Practitioner

## 2020-01-16 ENCOUNTER — Ambulatory Visit (INDEPENDENT_AMBULATORY_CARE_PROVIDER_SITE_OTHER): Payer: Medicare Other

## 2020-01-16 ENCOUNTER — Other Ambulatory Visit: Payer: Self-pay | Admitting: Adult Health Nurse Practitioner

## 2020-01-16 ENCOUNTER — Ambulatory Visit: Payer: Medicare Other | Admitting: Physical Therapy

## 2020-01-16 ENCOUNTER — Ambulatory Visit
Admission: RE | Admit: 2020-01-16 | Discharge: 2020-01-16 | Disposition: A | Discharge status: Home / Self Care | Payer: Medicare Other | Source: Ambulatory Visit | Attending: Adult Health Nurse Practitioner | Admitting: Adult Health Nurse Practitioner

## 2020-01-16 DIAGNOSIS — R0602 Shortness of breath: Secondary | ICD-10-CM | POA: Diagnosis not present

## 2020-01-16 DIAGNOSIS — Z79899 Other long term (current) drug therapy: Secondary | ICD-10-CM

## 2020-01-16 DIAGNOSIS — R768 Other specified abnormal immunological findings in serum: Secondary | ICD-10-CM | POA: Diagnosis not present

## 2020-01-16 DIAGNOSIS — R413 Other amnesia: Secondary | ICD-10-CM | POA: Diagnosis not present

## 2020-01-16 DIAGNOSIS — R140 Abdominal distension (gaseous): Secondary | ICD-10-CM

## 2020-01-16 DIAGNOSIS — R7989 Other specified abnormal findings of blood chemistry: Secondary | ICD-10-CM

## 2020-01-16 DIAGNOSIS — R748 Abnormal levels of other serum enzymes: Secondary | ICD-10-CM

## 2020-01-16 DIAGNOSIS — I10 Essential (primary) hypertension: Secondary | ICD-10-CM

## 2020-01-16 DIAGNOSIS — Z114 Encounter for screening for human immunodeficiency virus [HIV]: Secondary | ICD-10-CM

## 2020-01-16 NOTE — Progress Notes (Signed)
Patient to come in for labs today after abdominal ultrasound. Would also like nurse visit for orthostatic B/P. Attempted calling home number [phone].  Also sent message via MyChart.

## 2020-01-16 NOTE — Progress Notes (Signed)
Patient presents to the office to have labs done. These are labs that could not be added on to prior visit.

## 2020-01-17 LAB — HEPATITIS C ANTIBODY
Hepatitis C Ab: NONREACTIVE
SIGNAL TO CUT-OFF: 0.01 (ref <1.00)

## 2020-01-17 LAB — PROTIME-INR
INR: 1.1
Prothrombin Time: 11.2 s (ref 9.0–11.5)

## 2020-01-17 LAB — HEPATITIS B CORE ANTIBODY, TOTAL: Hep B Core Total Ab: NONREACTIVE

## 2020-01-17 LAB — HEPATITIS A ANTIBODY, TOTAL: Hepatitis A AB,Total: REACTIVE — AB

## 2020-01-17 LAB — HIV ANTIBODY (ROUTINE TESTING W REFLEX): HIV 1&2 Ab, 4th Generation: NONREACTIVE

## 2020-01-17 LAB — TSH: TSH: 0.8 mIU/L (ref 0.40–4.50)

## 2020-01-17 LAB — RPR: RPR Ser Ql: NONREACTIVE

## 2020-01-20 ENCOUNTER — Other Ambulatory Visit: Payer: Self-pay | Admitting: Adult Health Nurse Practitioner

## 2020-01-20 ENCOUNTER — Ambulatory Visit: Payer: Medicare Other | Admitting: Physical Therapy

## 2020-01-20 LAB — FERRITIN: Ferritin: 34 ng/mL (ref 24–380)

## 2020-01-20 LAB — TEST AUTHORIZATION

## 2020-01-20 LAB — HEPATITIS A ANTIBODY, IGM: Hep A IgM: NONREACTIVE

## 2020-01-20 LAB — ALPHA-1-ANTITRYPSIN: A-1 Antitrypsin, Ser: 131 mg/dL (ref 83–199)

## 2020-01-20 LAB — HEPATITIS B E ANTIBODY: Hep B E Ab: NONREACTIVE

## 2020-01-20 LAB — IRON, TOTAL/TOTAL IRON BINDING CAP
%SAT: 22 % (calc) (ref 20–48)
Iron: 67 ug/dL (ref 50–180)
TIBC: 309 mcg/dL (calc) (ref 250–425)

## 2020-01-20 LAB — AMYLASE: Amylase: 32 U/L (ref 21–101)

## 2020-01-20 LAB — LIPASE: Lipase: 40 U/L (ref 7–60)

## 2020-01-20 LAB — GAMMA GT: GGT: 83 U/L — ABNORMAL HIGH (ref 3–70)

## 2020-01-20 LAB — HEPATITIS B SURFACE ANTIBODY,QUALITATIVE: Hep B S Ab: NONREACTIVE

## 2020-01-21 ENCOUNTER — Other Ambulatory Visit: Payer: Self-pay | Admitting: Adult Health Nurse Practitioner

## 2020-01-21 DIAGNOSIS — R000 Tachycardia, unspecified: Secondary | ICD-10-CM

## 2020-01-21 DIAGNOSIS — R600 Localized edema: Secondary | ICD-10-CM

## 2020-01-21 MED ORDER — POTASSIUM CHLORIDE CRYS ER 20 MEQ PO TBCR: EXTENDED_RELEASE_TABLET | ORAL | 1 refills | Status: DC

## 2020-01-21 MED ORDER — CARVEDILOL 3.125 MG PO TABS: 3.1250 mg | ORAL_TABLET | Freq: Two times a day (BID) | ORAL | 11 refills | Status: DC

## 2020-01-23 ENCOUNTER — Encounter: Payer: Self-pay | Admitting: Adult Health Nurse Practitioner

## 2020-01-23 ENCOUNTER — Other Ambulatory Visit: Payer: Self-pay

## 2020-01-23 ENCOUNTER — Ambulatory Visit: Payer: Medicare Other | Admitting: Adult Health Nurse Practitioner

## 2020-01-23 ENCOUNTER — Ambulatory Visit: Payer: Medicare Other | Attending: Adult Health | Admitting: Physical Therapy

## 2020-01-23 ENCOUNTER — Ambulatory Visit (INDEPENDENT_AMBULATORY_CARE_PROVIDER_SITE_OTHER): Payer: Medicare Other | Admitting: *Deleted

## 2020-01-23 ENCOUNTER — Encounter: Payer: Self-pay | Admitting: Physical Therapy

## 2020-01-23 VITALS — BP 112/73 | HR 91

## 2020-01-23 VITALS — BP 111/69 | HR 85 | Wt 175.2 lb

## 2020-01-23 DIAGNOSIS — R1312 Dysphagia, oropharyngeal phase: Secondary | ICD-10-CM | POA: Diagnosis not present

## 2020-01-23 DIAGNOSIS — R000 Tachycardia, unspecified: Secondary | ICD-10-CM | POA: Diagnosis not present

## 2020-01-23 DIAGNOSIS — I10 Essential (primary) hypertension: Secondary | ICD-10-CM

## 2020-01-23 DIAGNOSIS — R2689 Other abnormalities of gait and mobility: Secondary | ICD-10-CM | POA: Diagnosis not present

## 2020-01-23 DIAGNOSIS — R0602 Shortness of breath: Secondary | ICD-10-CM | POA: Diagnosis not present

## 2020-01-23 DIAGNOSIS — R600 Localized edema: Secondary | ICD-10-CM | POA: Diagnosis not present

## 2020-01-23 DIAGNOSIS — R2681 Unsteadiness on feet: Secondary | ICD-10-CM | POA: Diagnosis not present

## 2020-01-23 DIAGNOSIS — I6930 Unspecified sequelae of cerebral infarction: Secondary | ICD-10-CM

## 2020-01-23 LAB — CUP PACEART REMOTE DEVICE CHECK
Date Time Interrogation Session: 20210304030915
Implantable Pulse Generator Implant Date: 20180907

## 2020-01-23 NOTE — Progress Notes (Signed)
ILR Remote 

## 2020-01-23 NOTE — Therapy (Signed)
[facility] [address] [place], [place], [address] Phone: [phone]   Fax:  [phone]  Physical Therapy Treatment  Patient Details  Name: Earl [name] MRN: [id number] Date of Birth: 07/16/1941 Referring Provider (PT): Delice Lesch   Encounter Date: 01/23/2020  PT End of Session - 01/23/20 1842    Visit Number  11    Number of Visits  17    Date for PT Re-Evaluation  02/25/20   60 day poc but 90 day cert due to scheduling   Authorization Type  10th visit progress note    PT Start Time  1020    PT Stop Time  1100    PT Time Calculation (min)  40 min    Activity Tolerance  Patient tolerated treatment well    Behavior During Therapy  [facility] for tasks assessed/performed       Past Medical History:  Diagnosis Date  . Adenomatous colon polyp   . Allergy   . Anal fissure   . Anxiety   . Arthritis   . BPH (benign prostatic hyperplasia)   . Cataract   . Diabetes mellitus ([place])   . GERD (gastroesophageal reflux disease)   . Hyperlipidemia   . Hypertension   . Hypothyroidism   . Other testicular hypofunction   . Stroke ([place])   . Type II or unspecified type diabetes mellitus without mention of complication, not stated as uncontrolled     Past Surgical History:  Procedure Laterality Date  . CIRCUMCISION N/A 06/11/2019   Procedure: CIRCUMCISION ADULT;  Surgeon: [name], MD;  Location: [facility];  Service: Urology;  Laterality: N/A;  . COLONOSCOPY    . INGUINAL HERNIA REPAIR Bilateral    1968 and 2004  . LOOP RECORDER INSERTION N/A 07/28/2017   Procedure: LOOP RECORDER INSERTION;  Surgeon: Deboraha Sprang, MD;  Location: [place] CV LAB;  Service: Cardiovascular;  Laterality: N/A;  . TEE WITHOUT CARDIOVERSION N/A 07/28/2017   Procedure: TRANSESOPHAGEAL ECHOCARDIOGRAM (TEE);  Surgeon: Sueanne Margarita, MD;  Location: [facility] ENDOSCOPY;  Service: Cardiovascular;  Laterality: N/A;    Vitals:   01/23/20 1028   BP: 112/73  Pulse: 91    Subjective Assessment - 01/23/20 1023    Subjective  Trying to figue out what's going on.  Had some fluid in my feet, but that's going down, as they added some medication. Going back to the doctor today.  Feel a little better; did not want to go too long before getting back to therapy.    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    Currently in Pain?  No/denies    Pain Onset  More than a month ago                       [facility] Adult PT Treatment/Exercise - 01/23/20 0001      Ambulation/Gait   Ambulation/Gait  Yes    Ambulation/Gait Assistance  5: Supervision    Ambulation/Gait Assistance Details  Indoor surfaces, walking over blue and red compliant surface mats.    Ambulation Distance (Feet)  200 Feet   115   Assistive device  Straight cane    Gait Pattern  Step-through pattern;Decreased hip/knee flexion - right;Decreased dorsiflexion - right    Ambulation Surface  Level;Indoor    Pre-Gait Activities  HR assessed after standing balance and gait activities, 91-95 bpm.      Gait Comments  Slightly slowed pace of gait today, but no LOB, overall improved gait pattern; with second bout of gait, last 20 ft, noted RLE fatigue with decreased step length.      Pt reports he is walking hallway (approx 50-70 ft x 2), 3 laps at a time, twice per day for exercise.    Balance Exercises - 01/23/20 1036      Balance Exercises: Standing   Tandem Gait  Forward;Retro;3 reps;Upper extremity support    Retro Gait  Upper extremity support;3 reps;Other (comment)   Fwd/back along counter   Sidestepping  Upper extremity support;3 reps   Cues for increased foot clearance   Marching  Forwards;Upper extremity assist 1;Solid surface;Retro   Along counter x 3 reps; progressed to tandem march x 2   Other Standing Exercises  Above exercises also  performed along counter on blue compliant mat surface x 2 laps each with UE support at counter.  PT provides min guard assistance on compliant surfaces     Sit<>stand x 5 reps through session, BUE support, modified independently. Marching in place x 10 reps at counter, solid surfaces Heel/toe raises x 10 reps at counter for support     PT Short Term Goals - 12/26/19 1033      PT SHORT TERM GOAL #1   Title  Pt will be independent with HEP for improved balance, strength and gait.    Time  4    Period  Weeks    Status  Achieved    Target Date  12/27/19      PT SHORT TERM GOAL #2   Title  Pt will increase 30 sec sit to stand from 6 to 8 or more for improved functional strength and mobility.    Baseline  6 on 11/26/2019; 7 times 12/26/2019    Time  4    Period  Weeks    Status  Not Met    Target Date  12/27/19      PT SHORT TERM GOAL #3   Title  Pt will ambulate >500' on varied surfaces with SPC mod I for improved community mobility and activity tolerance.    Baseline  500 ft supervision/min guard (compliant surfaces) with cane    Time  4    Period  Weeks    Status  Partially Met    Target Date  12/27/19        PT Long Term Goals - 11/27/19 0818      PT LONG TERM GOAL #1   Title  Pt will increase Berg Balance score from 44/56 to >47/56 for improved balance and decreased fall risk.    Baseline  44/56 on 11/26/2019    Time  8    Period  Weeks    Status  New    Target Date  01/25/20      PT LONG TERM GOAL #2   Title  Pt will increase gait speed from 0.15ms to >0.745m for improved gait safety in the community.    Baseline  0.5348mon 11/26/2019    Time  8    Period  Weeks    Status  New    Target Date  01/25/20      PT LONG TERM GOAL #3   Title  Pt will ambulate >1000' with SPC versus no device on all surfaces for improved gait safety.    Time  8  Period  Weeks    Status  New    Target Date  01/25/20            Plan - 01/23/20 1843    Clinical Impression  Statement  Pt presents back to OPPT today, feeling somewhat better, having had several MD visits this past week.  HR today stays in 90's with activity and pt does not seem as out of breath or fatigued with activities today.  Pt's max distance of gait today is 200 ft; her performs dynamic balance exercises on solid and compliant surfaces with preference for UE support.  He will continue to benefit from skilled PT to address balance, strength, gait for improved overall funcitonal moiblity.    Personal Factors and Comorbidities  Comorbidity 3+    Comorbidities  DM, HTN, afib with loop recorder    Examination-Activity Limitations  Locomotion Level    Examination-Participation Restrictions  Yard Work;Community Activity    Stability/Clinical Decision Making  Evolving/Moderate complexity    Rehab Potential  Good    PT Frequency  2x / week   plus eval   PT Duration  8 weeks    PT Treatment/Interventions  ADLs/Self Care Home Management;Gait training;Stair training;Functional mobility training;Therapeutic activities;Therapeutic exercise;Patient/family education;Neuromuscular re-education;Balance training;Manual techniques    PT Next Visit Plan  This is week 6 of 8 in POC; pt is scheduled next week only, so need to discuss additional appts/renew/discharge.  Continue balance and gait activities, check LTGs    Consulted and Agree with Plan of Care  Patient       Patient will benefit from skilled therapeutic intervention in order to improve the following deficits and impairments:  Abnormal gait, Decreased activity tolerance, Decreased balance, Decreased mobility, Decreased strength  Visit Diagnosis: Unsteadiness on feet  Other abnormalities of gait and mobility     Problem List Patient Active Problem List   Diagnosis Date Noted  . 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 Acquired thrombophilia ([place]) 09/09/2019  . Abdominal aortic atherosclerosis ([place]) 09/09/2019  . Late effect of cerebrovascular  accident (CVA) 03/28/2019  . CKD stage 2 due to type 2 diabetes mellitus ([place]) 12/20/2017  . Chronic anticoagulation 09/04/2017  . Labile blood glucose   . Hypoalbuminemia due to protein-calorie malnutrition ([place])   . Ataxia, post-stroke   . History of CVA with residual deficit   . Hypothyroidism 09/13/2016  . Medicare annual wellness visit, subsequent 08/28/2015  . Vitamin D deficiency 04/29/2014  . Medication management 01/03/2014  . Type 2 diabetes mellitus ([place])   . BPH (benign prostatic hyperplasia)   . 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 Essential hypertension 11/07/2013  . Hyperlipidemia associated with type 2 diabetes mellitus ([place]) 11/07/2013  . Paroxysmal atrial fibrillation ([place]) 09/18/2012    Quention Mcneill W. 01/23/2020, 6:47 PM Frazier Butt., PT  [facility] [address] [place], [place], [address] Phone: [phone]   Fax:  [phone]  Name: Kevyn Boquet MRN: [id number] Date of Birth: 03/13/1941

## 2020-01-27 ENCOUNTER — Ambulatory Visit: Payer: Medicare Other | Admitting: Physical Therapy

## 2020-01-27 ENCOUNTER — Other Ambulatory Visit: Payer: Self-pay

## 2020-01-27 DIAGNOSIS — R2681 Unsteadiness on feet: Secondary | ICD-10-CM | POA: Diagnosis not present

## 2020-01-27 DIAGNOSIS — R2689 Other abnormalities of gait and mobility: Secondary | ICD-10-CM | POA: Diagnosis not present

## 2020-01-27 DIAGNOSIS — R1312 Dysphagia, oropharyngeal phase: Secondary | ICD-10-CM | POA: Diagnosis not present

## 2020-01-27 NOTE — Therapy (Signed)
[facility] [address] [place], [place], [address] Phone: [phone]   Fax:  [phone]  Physical Therapy Treatment  Patient Details  Name: Earl [name] MRN: [id number] Date of Birth: [birth year] Referring Provider (PT): Delice Lesch   Encounter Date: 01/27/2020  PT End of Session - 01/27/20 6767    Visit Number  12    Number of Visits  17    Date for PT Re-Evaluation  02/25/20   60 day poc but 90 day cert due to scheduling   Authorization Type  10th visit progress note    PT Start Time  1021    PT Stop Time  1103    PT Time Calculation (min)  42 min    Activity Tolerance  Patient tolerated treatment well    Behavior During Therapy  [facility] for tasks assessed/performed       Past Medical History:  Diagnosis Date  . Adenomatous colon polyp   . Allergy   . Anal fissure   . Anxiety   . Arthritis   . BPH (benign prostatic hyperplasia)   . Cataract   . Diabetes mellitus ([place])   . GERD (gastroesophageal reflux disease)   . Hyperlipidemia   . Hypertension   . Hypothyroidism   . Other testicular hypofunction   . Stroke ([place])   . Type II or unspecified type diabetes mellitus without mention of complication, not stated as uncontrolled     Past Surgical History:  Procedure Laterality Date  . CIRCUMCISION N/A 06/11/2019   Procedure: CIRCUMCISION ADULT;  Surgeon: [name], MD;  [facility]: [facility];  Service: Urology;  Laterality: N/A;  . COLONOSCOPY    . INGUINAL HERNIA REPAIR Bilateral    1968 and 2004  . LOOP RECORDER INSERTION N/A 07/28/2017   Procedure: LOOP RECORDER INSERTION;  Surgeon: Deboraha Sprang, MD;  Location: [place] CV LAB;  Service: Cardiovascular;  Laterality: N/A;  . TEE WITHOUT CARDIOVERSION N/A 07/28/2017   Procedure: TRANSESOPHAGEAL ECHOCARDIOGRAM (TEE);  Surgeon: Sueanne Margarita, MD;  Location: [facility] ENDOSCOPY;  Service: Cardiovascular;  Laterality: N/A;    There were no vitals filed  for this visit.  Subjective Assessment - 01/27/20 1023    Subjective  Will be going to the doctor (neurologist) this week.  Spent the weekend with my family.  Doing my exercises and walking daily.    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    Currently in Pain?  No/denies    Pain Onset  More than a month ago                       [facility] Adult PT Treatment/Exercise - 01/27/20 0001      Ambulation/Gait   Ambulation/Gait  Yes    Ambulation/Gait Assistance  5: Supervision    Ambulation Distance (Feet)  332 Feet   additional 100 ft x 2   Assistive device  Straight cane   rubber tip quad base   Gait Pattern  Step-through pattern;Decreased hip/knee flexion - right;Decreased dorsiflexion - right    Ambulation Surface  Level;Indoor    Gait velocity  16.34 sec = 2 ft/sec (0.61 m/sec)    Pre-Gait Activities  Pre gait vitals:  HR 85, O2 sats:  100%; post gait:  99 % O2, 101 HR.    Gait Comments  240 ft in 2 minute walk      Standardized Balance Assessment   Standardized Balance Assessment  Berg Balance Test;Dynamic Gait Index      Berg Balance Test   Sit to Stand  Able to stand without using hands and stabilize independently    Standing Unsupported  Able to stand safely 2 minutes    Sitting with Back Unsupported but Feet Supported on Floor or Stool  Able to sit safely and securely 2 minutes    Stand to Sit  Sits safely with minimal use of hands    Transfers  Able to transfer safely, definite need of hands    Standing Unsupported with Eyes Closed  Able to stand 10 seconds safely    Standing Ubsupported with Feet Together  Able to place feet together independently and stand 1 minute safely    From Standing, Reach Forward with Outstretched Arm  Can reach forward >12 cm safely (5")   9"   From Standing Position, Pick up Object from Floor  Able to pick up shoe  safely and easily    From Standing Position, Turn to Look Behind Over each Shoulder  Looks behind from both sides and weight shifts well    Turn 360 Degrees  Able to turn 360 degrees safely but slowly    Standing Unsupported, Alternately Place Feet on Step/Stool  Able to complete 4 steps without aid or supervision    Standing Unsupported, One Foot in Front  Able to plae foot ahead of the other independently and hold 30 seconds    Standing on One Leg  Able to lift leg independently and hold equal to or more than 3 seconds    Total Score  47      Dynamic Gait Index   Level Surface  Mild Impairment    Change in Gait Speed  Mild Impairment    Gait with Horizontal Head Turns  Mild Impairment    Gait with Vertical Head Turns  Mild Impairment    Gait and Pivot Turn  Mild Impairment    Step Over Obstacle  Moderate Impairment    Step Around Obstacles  Mild Impairment    Steps  Mild Impairment    Total Score  15      Exercises   Exercises  Other Exercises    Other Exercises   Pt performs HEP from [place]:  seated marching x 10 reps, seated LAQ x 10 reps, ankle pumps x 10 reps; seated R hamstring stretch, x 30 seconds; verbally reviewed standing gastroc stretch; standing at chair:  hip abduction x 10, hip extension x 10, marching in place x 10 reps.  Pt return demo understanding.  PT provides cues for performing 2 sets x 10 reps, daily of all exercises.             PT Education - 01/27/20 1346    Education Details  Progress towards goals, still with continued balance deficits and gait endurance/gait speed; discussed that PT would continue to be beneficial-he will discuss with daughter and discuss at next visit with PT regarding additional POC versus d/c    Person(s) Educated  Patient    Methods  Explanation    Comprehension  Verbalized understanding       PT Short Term Goals - 12/26/19 1033      PT SHORT TERM GOAL #1   Title  Pt will be independent with HEP for improved balance,  strength and gait.    Time  4  Period  Weeks    Status  Achieved    Target Date  12/27/19      PT SHORT TERM GOAL #2   Title  Pt will increase 30 sec sit to stand from 6 to 8 or more for improved functional strength and mobility.    Baseline  6 on 11/26/2019; 7 times 12/26/2019    Time  4    Period  Weeks    Status  Not Met    Target Date  12/27/19      PT SHORT TERM GOAL #3   Title  Pt will ambulate >500' on varied surfaces with SPC mod I for improved community mobility and activity tolerance.    Baseline  500 ft supervision/min guard (compliant surfaces) with cane    Time  4    Period  Weeks    Status  Partially Met    Target Date  12/27/19        PT Long Term Goals - 01/27/20 1347      PT LONG TERM GOAL #1   Title  Pt will increase Berg Balance score from 44/56 to >47/56 for improved balance and decreased fall risk.    Baseline  44/56 on 11/26/2019; 47/56 01/27/2020    Time  8    Period  Weeks    Status  Achieved      PT LONG TERM GOAL #2   Title  Pt will increase gait speed from 0.28ms to >0.750m for improved gait safety in the community.    Baseline  0.5357mon 11/26/2019; 0.61 m/sec 01/27/2020    Time  8    Period  Weeks    Status  Not Met      PT LONG TERM GOAL #3   Title  Pt will ambulate >1000' with SPC versus no device on all surfaces for improved gait safety.    Time  8    Period  Weeks    Status  New            Plan - 01/27/20 1348    Clinical Impression Statement  Began assessing LTGs, with pt meeting LTG 1 for improved Berg score to 47/56.  Pt has improved gait velocity to 2 ft/sec (0.61 m/sec), but not to goal level, so LTG 2 not met.  Assessed DGI score, with score 15/24 indicating increased fall risk, with use of cane.  However, PT does not feel that not using a cane is a safe option at this time, so DGI score not likely to increase significantly.  Pt has made slow, steady progress with PT, and he still demonstrates decreased balance, decreased gait  velcoity, decreased gait endurance and would continue to benefit from skilled PT to address the above stated deficits for improved mobility/decreased fall risk.  Pt to discuss with his daughter regarding PT POC, as this week is his last scheduled visit and he was unsure about adding more.    Personal Factors and Comorbidities  Comorbidity 3+    Comorbidities  DM, HTN, afib with loop recorder    Examination-Activity Limitations  Locomotion Level    Examination-Participation Restrictions  Yard Work;Community Activity    Stability/Clinical Decision Making  Evolving/Moderate complexity    Rehab Potential  Good    PT Frequency  2x / week   plus eval   PT Duration  8 weeks    PT Treatment/Interventions  ADLs/Self Care Home Management;Gait training;Stair training;Functional mobility training;Therapeutic activities;Therapeutic exercise;Patient/family education;Neuromuscular re-education;Balance training;Manual techniques  PT Next Visit Plan  This is week 7 of 8 in POC; pt is scheduled this week only, so need to discuss additional appts/renew/discharge.  Check long distance, outdoor gait    Consulted and Agree with Plan of Care  Patient       Patient will benefit from skilled therapeutic intervention in order to improve the following deficits and impairments:  Abnormal gait, Decreased activity tolerance, Decreased balance, Decreased mobility, Decreased strength  Visit Diagnosis: Unsteadiness on feet  Other abnormalities of gait and mobility     Problem List Patient Active Problem List   Diagnosis Date Noted  . 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 Acquired thrombophilia ([place]) 09/09/2019  . Abdominal aortic atherosclerosis ([place]) 09/09/2019  . Late effect of cerebrovascular accident (CVA) 03/28/2019  . CKD stage 2 due to type 2 diabetes mellitus ([place]) 12/20/2017  . Chronic anticoagulation 09/04/2017  . Labile blood glucose   . Hypoalbuminemia due to protein-calorie malnutrition ([place])   .  Ataxia, post-stroke   . History of CVA with residual deficit   . Hypothyroidism 09/13/2016  . Medicare annual wellness visit, subsequent 08/28/2015  . Vitamin D deficiency 04/29/2014  . Medication management 01/03/2014  . Type 2 diabetes mellitus ([place])   . BPH (benign prostatic hyperplasia)   . 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 Essential hypertension 11/07/2013  . Hyperlipidemia associated with type 2 diabetes mellitus ([place]) 11/07/2013  . Paroxysmal atrial fibrillation ([place]) 09/18/2012    Earl [name] W. 01/27/2020, 1:53 PM  Frazier Butt., PT   [facility] [address] [place], [place], [address] Phone: [phone]   Fax:  [phone]  Name: Earl [name] MRN: [id number] Date of Birth: [birth year]

## 2020-01-28 ENCOUNTER — Other Ambulatory Visit: Payer: Self-pay | Admitting: Internal Medicine

## 2020-01-29 ENCOUNTER — Other Ambulatory Visit: Payer: Self-pay

## 2020-01-29 ENCOUNTER — Ambulatory Visit: Payer: Medicare Other | Admitting: Adult Health

## 2020-01-29 ENCOUNTER — Encounter: Payer: Self-pay | Admitting: Adult Health

## 2020-01-29 VITALS — BP 118/66 | HR 95 | Temp 97.6°F | Ht 73.5 in | Wt 180.8 lb

## 2020-01-29 DIAGNOSIS — E785 Hyperlipidemia, unspecified: Secondary | ICD-10-CM

## 2020-01-29 DIAGNOSIS — I63441 Cerebral infarction due to embolism of right cerebellar artery: Secondary | ICD-10-CM

## 2020-01-29 DIAGNOSIS — R2689 Other abnormalities of gait and mobility: Secondary | ICD-10-CM

## 2020-01-29 DIAGNOSIS — I69398 Other sequelae of cerebral infarction: Secondary | ICD-10-CM | POA: Diagnosis not present

## 2020-01-29 DIAGNOSIS — Z7901 Long term (current) use of anticoagulants: Secondary | ICD-10-CM

## 2020-01-29 DIAGNOSIS — I10 Essential (primary) hypertension: Secondary | ICD-10-CM | POA: Diagnosis not present

## 2020-01-29 DIAGNOSIS — I480 Paroxysmal atrial fibrillation: Secondary | ICD-10-CM

## 2020-01-29 DIAGNOSIS — E1159 Type 2 diabetes mellitus with other circulatory complications: Secondary | ICD-10-CM

## 2020-01-29 NOTE — Progress Notes (Signed)
STROKE NEUROLOGY FOLLOW UP NOTE  NAME: Earl [name] DOB: January 05, 1941  REASON FOR VISIT: stroke follow up HISTORY FROM: pt and chart  Chief Complaint Chief Complaint  Patient presents with  . Follow-up    3 mon f/u. Alone. Rm 9. No new concerns at this time     HPI:  Earl [name] is a 79 year old male who is being seen today, 01/29/2020, for stroke follow-up as well as follow up regarding prior concerns of worsening prior stroke symptoms. He has improved since prior visit as worsening symptoms felt due to deconditioning and increased stressors with underlying depression/anxiety.  He has been working with outpatient physical therapy and feels as though his balance has improved. He denies any dizziness or lightheadedness. Continues to ambulate with a cane when out of the house and denies recent falls. Overall has improved greatly since last visit. Continues on Eliquis 5 mg twice daily for secondary stroke prevention and atrial fibrillation and atorvastatin 40 mg daily for secondary stroke prevention without side effects.  Blood pressure today 118/66. No further concerns at this time.     History provided for reference purpose only  History Summary: Earl [name] is a 79 y.o. male with history of diabetes and hypertension who presented with nausea and dizziness. 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 CTA head and neck unremarkable. EF 55-60%. LDL 87 and A1c 6.6. Patient had history of questionable A. fib episode during colonoscopy in the past, had 30 day cardiac event monitoring showed no A. Fib. TEE and remarkable and Loop recorder placed. Discharge to CIR with aspirin and Plavix as well as Lipitor.   History 09/04/17: During the interval time, the patient has been doing well. While he was in CIR, he was found to have afib/aflutter episode on loop recorder. He was put on eliquis 38m bid and DAPT discontinued. Pt doing well  still at outpt PT/OT for right sided ataxia. BP good at home, today in clinic 148/89. He did not check finger stick at home but check urine glucose always negative. Continued on eliquis and lipitor without side effects.   UPDATE 02/08/18: Earl [name] being seen today for a four-month follow-up and is accompanied by his wife.  He has been doing well overall with mild complaints of dizziness.  Dizziness does get worse with increasing fatigue.  Continues to take Eliquis without side effects of increasing bleeding or bruising.  Continues to take Lipitor without side effects of myalgias.  A1c 7.0 LDL 42 on 12/21/2017.  PCP managing diabetes and cholesterol.  Blood pressure at today's visit stable at 136/84.  Uses cane for long distance ambulation and to help with balance issues.  Continues to do at home PT exercises to help with balance.  Denies symptoms of sleep apnea such as snoring, daytime sleepiness, or frequent napping.  Does have a complaint of excessive salivation but denies dysphasia.  This is new since his stroke but has been improving. Denies new or worsening stroke/TIA symptoms.  Update 11/26/2018: Patient is being seen today per patient request due to concerns of increased dizziness, vertigo, head fullness, irregular heart rate and anxiety where patient is concerned for possible TIAs.  He is accompanied today by his son.  Patient states that he has been experiencing this dizziness since his stroke but son and family members have noticed worsening in the past few months.  He states he experiences dizziness while sitting to standing, rapid eye or head movement,  or attempting to concentrate/read for long period of time.  He states this can happen multiple times per day and lasts approximately 5 minutes.  He does continue to use his cane without any recent falls but son states at times when he is attempting to stand, he will have a difficult time balancing himself where it appears as though he may fall but  fortunately has not done so at this time.  He was started on meclizine by his PCP but denies benefit.  He was also started on Xanax as he is not sleeping but has not noticed much benefit in his dizziness sensation.  He does state that his wife was recently diagnosed with cancer in 08/2018 for the third time and is currently undergoing radiation and chemo which has been placing a lot of increased stress outpatient and anxiety.  Son believes that the symptoms have worsened since this time.  He does continue to take Eliquis without side effects of bleeding or bruising.  He has not followed up with cardiology since hospital discharge.  He continues to take atorvastatin without side effects myalgias.  Blood pressure today stable at 148/80.  He does not consistently monitor at home but states SBP typically 130s when he is at appointments.  Patient denies chest pain, nausea, headache, visual changes or any neurological symptoms.  No further concerns at this time.    Virtual visit 02/25/2019: He was stable from a stroke standpoint with residual mild deficits of dizziness and balance difficulties.  Prior office visit on 11/26/2018 per patient request due to concerns of increased dizziness, vertigo, head fullness, irregular heart rate and anxiety and patient was concerned for possible TIAs.  PCP attempted to use meclizine and Xanax but denies benefit of increased dizziness sensation.  He did endorse increased stress during that time.  CT head obtained to ensure no new stroke which was unremarkable.  He did have follow-up visit since this time with cardiology Dr. Klein with diagnosis of orthostatic hypertension which was likely contributing to his dizziness symptoms.  His endorses overall improvement of dizziness and balance since prior visit as he did participate in outpatient physical therapy. He does continue to use cane for assistance and denies any recent falls. He has not been monitoring blood pressure at home as he does  follow closely with PCP. BP has been stable and follows with PCP for ongoing management.  He continues on atorvastatin 40 mg without side effects myalgias. No further concerns at this time. Denies new or worsening stroke/TIA symptoms.   Update 10/30/2019: Earl [name] is a 78-year-old male who is being seen today for stroke follow-up accompanied by his son.  He has missed prior follow-up visits and apparently son called office requesting today's visit due to concerns of fluctuating symptoms of prior stroke deficits such as worsening balance, lightheadedness, impairment of gait and decreased right hand dexterity.  He continues to ambulate with a cane and denies any recent falls.  Son does endorse occasional increased slurred speech when fatigued which has been chronic.  Unfortunately, patient's wife 55 years passed away in August and son has noticed these fluctuations more prominent since that time with increased stress, depression and anxiety.  Son is concerned regarding reoccurring TIAs.  At prior visits, discussed dizziness concerns with evaluation by cardiologist who felt likely orthostatic in nature.  Patient endorses dizziness or "swoshy head sensation" when sitting to standing but typically subsides rapidly but at times will be accompanied by worsening balance, and lightheadedness and will   resolve after resting.  He was started on alprazolam as needed for sleep/anxiety but is not on any other type of antidepressant.  Denies any additional strokelike symptoms and all above symptoms present after prior stroke.  Continues on Eliquis 5 mg twice daily and atorvastatin 40 mg daily for secondary stroke prevention without side effects.  Blood pressure today 138/80.  Glucose levels stable.  No further concerns at this time.     REVIEW OF SYSTEMS: Full 14 system review of systems performed and notable only for those listed below and in HPI above, all others are negative:, Imbalance and walking difficulty   The  following represents the patient's updated allergies and side effects list: Allergies  Allergen Reactions  . Penicillins Other (See Comments)   Past Medical History:  Diagnosis Date  . Adenomatous colon polyp   . Allergy   . Anal fissure   . Anxiety   . Arthritis   . BPH (benign prostatic hyperplasia)   . Cataract   . Diabetes mellitus (HCC)   . GERD (gastroesophageal reflux disease)   . Hyperlipidemia   . Hypertension   . Hypothyroidism   . Other testicular hypofunction   . Stroke (HCC)   . Type II or unspecified type diabetes mellitus without mention of complication, not stated as uncontrolled    Current Outpatient Medications on File Prior to Visit  Medication Sig Dispense Refill  . ALPRAZolam (XANAX) 1 MG tablet Take 1/2-1 tablet at Bedtime  ONLY if needed for Sleep &  limit to 5 days /week to avoid Addiction & Dementia 60 tablet 0  . apixaban (ELIQUIS) 5 MG TABS tablet Take 1 tablet 2 x /day to Prevent Blood Clots 180 tablet 3  . atorvastatin (LIPITOR) 40 MG tablet Take 1 tablet at bedtime for cholestaerol. 90 tablet 3  . Blood Glucose Monitoring Suppl (ONE TOUCH ULTRA 2) w/Device KIT Check blood sugar 1 time a day-DX-E11.9 1 kit 0  . carvedilol (COREG) 3.125 MG tablet Take 1 tablet (3.125 mg total) by mouth 2 (two) times daily. 60 tablet 11  . Cholecalciferol (VITAMIN D PO) Take 1 tablet by mouth daily.     . Cyanocobalamin (B-12) 50 MCG TABS Take 50 mcg by mouth daily.    . finasteride (PROSCAR) 5 MG tablet Take 1 tablet Daily for Prostate 90 tablet 3  . furosemide (LASIX) 20 MG tablet Take 1 tablet (20 mg total) by mouth daily. 30 tablet 2  . gabapentin (NEURONTIN) 100 MG capsule Take 1 to 3 capsules 1 hour before Bedtime for Diabetic Neuropathy Pains 270 capsule 0  . glipiZIDE (GLUCOTROL) 5 MG tablet Take 1 tablet 2 x /day with Meals for Diabetes 180 tablet 3  . glucose blood (ONETOUCH ULTRA) test strip Check blood sugar 1 time a day-DX-E11.9 100 each 2  . Lancets  (ONETOUCH DELICA PLUS LANCET30G) MISC CHECK BLOOD SUGAR ONCE A DAY. 100 each 0  . Lancets Misc. (ONE TOUCH SURESOFT) MISC Check blood sugar 1 time a day-DX-E11.91 1 each 0  . levothyroxine (SYNTHROID) 50 MCG tablet Take 1 tablet daily on an empty stomach with only water for 30 minutes & no Antacid meds, Calcium or Magnesium for 4 hours & avoid Biotin 90 tablet 3  . Magnesium 250 MG TABS Take 250 mg by mouth daily.     . metFORMIN (GLUCOPHAGE-XR) 500 MG 24 hr tablet Take 2 tablets 2 x  /day with Meals for Diabetes 360 tablet 3  . Multiple Vitamin (MULTIVITAMIN) capsule Take 1   capsule by mouth daily.      [name] omeprazole (PRILOSEC) 40 MG capsule Take 1 capsule (40 mg total) by mouth daily. 30 capsule 1  . potassium chloride SA (KLOR-CON) 20 MEQ tablet Take one tablet by mouth daily with furosemide (lasix).  Take with food. 30 tablet 1  . vitamin C (ASCORBIC ACID) 500 MG tablet Take 500 mg by mouth daily.       No current facility-administered medications on file prior to visit.   Social History   Socioeconomic History  . Marital status: Widowed    Spouse name: Not on file  . Number of children: 2  . Years of education: Not on file  . Highest education level: Not on file  Occupational History  . Occupation: Herbalist: RETIRED  Tobacco Use  . Smoking status: Never Smoker  . Smokeless tobacco: Never Used  Substance and Sexual Activity  . Alcohol use: No  . Drug use: No  . Sexual activity: Not on file  Other Topics Concern  . Not on file  Social History Narrative  . Not on file   Social Determinants of Health   Financial Resource Strain:   . Difficulty of Paying Living Expenses: Not on file  Food Insecurity:   . Worried About Charity fundraiser in the Last Year: Not on file  . Ran Out of Food in the Last Year: Not on file  Transportation Needs:   . Lack of Transportation (Medical): Not on file  . Lack of Transportation (Non-Medical): Not on  file  Physical Activity:   . Days of Exercise per Week: Not on file  . Minutes of Exercise per Session: Not on file  Stress:   . Feeling of Stress : Not on file  Social Connections:   . Frequency of Communication with Friends and Family: Not on file  . Frequency of Social Gatherings with Friends and Family: Not on file  . Attends Religious Services: Not on file  . Active Member of Clubs or Organizations: Not on file  . Attends Archivist Meetings: Not on file  . Marital Status: Not on file  Intimate Partner Violence:   . Fear of Current or Ex-Partner: Not on file  . Emotionally Abused: Not on file  . Physically Abused: Not on file  . Sexually Abused: Not on file      Physical examination  Today's Vitals   01/29/20 0959  BP: 118/66  Pulse: 95  Temp: 97.6 F (36.4 C)  TempSrc: Oral  Weight: 180 lb 12.8 oz (82 kg)  Height: 6' 1.5" (1.867 m)   Body mass index is 23.53 kg/m.   General: well developed, well nourished,  pleasant elderly African-American male, seated, in no evident distress Head: head normocephalic and atraumatic.   Neck: supple with no carotid or supraclavicular bruits Cardiovascular: regular rate and rhythm, no murmurs Musculoskeletal: no deformity Skin:  no rash/petichiae Vascular:  Normal pulses all extremities   Neurologic Exam Mental Status: Awake and fully alert. Oriented to place and time. Recent and remote memory intact. Attention span, concentration and fund of knowledge appropriate. Mood and affect appropriate.  Cranial Nerves: Pupils equal, briskly reactive to light. Extraocular movements full without nystagmus. Visual fields full to confrontation. Hearing intact. Facial sensation intact. Face, tongue, palate moves normally and symmetrically.  Motor: Normal bulk and tone. Normal strength in all tested extremity muscles. Sensory: intact to touch, pinprick, position and vibratory sensation.  Coordination: Rapid  alternating movements  normal in all extremities. Finger-to-nose and heel-to-shin performed accurately on left side with mild ataxia noted on right side. Gait and Station: Arises from chair without difficulty. Stance is normal. Gait demonstrates slow cautious steps with mild steppage gait and use of cane and mild imbalance with mild favoring to the right side Reflexes: 1+ and symmetric. Toes downgoing.        Assessment: Earl [name] is a 78-year-old male with right cerebellar infarct on 07/28/2017 likely embolic with undetermined source. Loop recorder placed at that time which has shown evidence of atrial fibrillation and therefore was started on Eliquis. Vascular risk factors include atrial fibrillation, DM, HLD and HTN.  Patient has continued to work with outpatient physical therapy and has seen improvement in his balance and gait as prior visit c/o worsening prior stroke symptoms felt due to deconditioning and psychosocial reasons.     Plan:  -Continue Eliquis (apixaban) daily and lipitor for secondary stroke prevention -Continue working with PT to assist with gait and balance - use of cane while outdoors as instructed by therapy and ensure fall precautions at all times -Encouraged increasing activity at home to prevent deconditioning -PCP for management of cholesterol, blood pressure and diabetes  -Cardiologist for atrial fibrillation and Eliquis management -Maintain strict control of hypertension with blood pressure goal below 130/90, diabetes with hemoglobin A1c goal below 6.5% and cholesterol with LDL cholesterol (bad cholesterol) goal below 70 mg/dL. I also advised the patient to eat a healthy diet with plenty of whole grains, cereals, fruits and vegetables, exercise regularly and maintain ideal body weight.  Followup in 6 months or will call earlier if needed - if stable, can be discharged from clinic  I spent 20 minutes of face-to-face and non-face-to-face time with patient.  This included previsit  chart review, lab review, study review, order entry, electronic health record documentation, patient education   Jessica McCue, AGNP-BC  [facility] [address] [place], [place] [address]  Phone [phone] Fax [phone]  

## 2020-01-29 NOTE — Patient Instructions (Addendum)
Continue taking eliquis and lipitor for secondary stroke prevention  Continue to follow up with PCP for HTN, HLD and DM management   Continue to follow up with cardiology for loop recorder  Continue to work with physical therapy   Follow up in 6 months or call sooner if needed

## 2020-01-30 ENCOUNTER — Ambulatory Visit: Payer: Medicare Other | Admitting: Physical Therapy

## 2020-01-30 ENCOUNTER — Other Ambulatory Visit: Payer: Self-pay

## 2020-01-30 DIAGNOSIS — R1312 Dysphagia, oropharyngeal phase: Secondary | ICD-10-CM | POA: Diagnosis not present

## 2020-01-30 DIAGNOSIS — R2689 Other abnormalities of gait and mobility: Secondary | ICD-10-CM | POA: Diagnosis not present

## 2020-01-30 DIAGNOSIS — R2681 Unsteadiness on feet: Secondary | ICD-10-CM | POA: Diagnosis not present

## 2020-01-30 NOTE — Patient Instructions (Addendum)
Access Code: [email or web address] URL: [email or web address] Date: 01/30/2020 Prepared by: Mady Haagensen  Exercises Seated Hamstring Stretch - 3 reps - 1 sets - 30 sec hold - 2-3x daily - 7x weekly Standing Gastroc Stretch at Counter - 3 reps - 1 sets - 30 sec hold - 2-3x daily - 7x weekly Sit to Stand - 10 reps - 1 sets - 1x daily - 7x weekly Seated March - 10 reps - 1-2 sets - 1-2x daily - 5x weekly Seated Long Arc Quad - 10 reps - 1-2 sets - 1-2x daily - 5x weekly Seated Ankle Pumps on Table - 10 reps - 1-2 sets - 1-2x daily - 5x weekly Standing Knee Flexion AROM with Chair Support - 10 reps - 2 sets - 1x daily - 5x weekly Standing Hip Extension with Counter Support - 10 reps - 2 sets - 1x daily - 5x weekly Standing Hip Abduction with Counter Support - 10 reps - 2 sets - 1x daily - 5x weekly Walking March - 10 reps - 2 sets - 1x daily - 5x weekly Sit to stand in stride stance - 5 reps - 2 sets - 1x daily - 5x weekly-Added 01/30/2020

## 2020-01-30 NOTE — Therapy (Signed)
[facility] [address] [place], [place], [address] Phone: [phone]   Fax:  [phone]  Physical Therapy Treatment  Patient Details  Name: Earl [name] MRN: [id number] Date of Birth: [birth year] Referring Provider (PT): Delice Lesch   Encounter Date: 01/30/2020  PT End of Session - 01/30/20 7062    Visit Number  13    Number of Visits  17    Date for PT Re-Evaluation  02/25/20   60 day poc but 90 day cert due to scheduling   Authorization Type  10th visit progress note    PT Start Time  1018    PT Stop Time  1058    PT Time Calculation (min)  40 min    Activity Tolerance  Patient tolerated treatment well    Behavior During Therapy  [facility] for tasks assessed/performed       Past Medical History:  Diagnosis Date  . Adenomatous colon polyp   . Allergy   . Anal fissure   . Anxiety   . Arthritis   . BPH (benign prostatic hyperplasia)   . Cataract   . Diabetes mellitus ([place])   . GERD (gastroesophageal reflux disease)   . Hyperlipidemia   . Hypertension   . Hypothyroidism   . Other testicular hypofunction   . Stroke ([place])   . Type II or unspecified type diabetes mellitus without mention of complication, not stated as uncontrolled     Past Surgical History:  Procedure Laterality Date  . CIRCUMCISION N/A 06/11/2019   Procedure: CIRCUMCISION ADULT;  Surgeon: [name], MD;  Location: [facility];  Service: Urology;  Laterality: N/A;  . COLONOSCOPY    . INGUINAL HERNIA REPAIR Bilateral    1968 and 2004  . LOOP RECORDER INSERTION N/A 07/28/2017   Procedure: LOOP RECORDER INSERTION;  Surgeon: Deboraha Sprang, MD;  Location: [facility];  Service: Cardiovascular;  Laterality: N/A;  . TEE WITHOUT CARDIOVERSION N/A 07/28/2017   Procedure: TRANSESOPHAGEAL ECHOCARDIOGRAM (TEE);  Surgeon: Sueanne Margarita, MD;  Location: [facility] ENDOSCOPY;  Service: Cardiovascular;  Laterality: N/A;    There were no vitals filed  for this visit.  Subjective Assessment - 01/30/20 1021    Subjective  Feel like I'm ahead of the game and I'm ready to finish therapy today.  No falls, no stumbles.    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    Currently in Pain?  No/denies    Pain Onset  More than a month ago                       [facility] Adult PT Treatment/Exercise - 01/30/20 0001      Transfers   Transfers  Sit to Stand;Stand to Sit    Sit to Stand  5: Supervision;Without upper extremity assist;From bed    Stand to Sit  5: Supervision;Without upper extremity assist;To bed    Number of Reps  2 sets;Other reps (comment)   5 reps, then 5 reps x 2 sets with RLE tucked posteriorly     Ambulation/Gait   Ambulation/Gait  Yes    Ambulation/Gait Assistance  5: Supervision    Ambulation/Gait Assistance Details  Cues for posture, foot clearance, heelstrike    Ambulation Distance (Feet)  600 Feet   outdoor surfaces   Assistive device  Straight cane    Gait Pattern  Step-through pattern;Decreased hip/knee flexion - right;Decreased dorsiflexion - right    Ambulation Surface  Unlevel;Outdoor;Paved      Knee/Hip Exercises: Standing   Other Standing Knee Exercises  Reviewed pt's standing portion of HEP:  performed 2 sets of 10 reps of each.  Pt reports only doing 1 set of 10 reps at home; recommend pt increase to 2 sets of 10.  Pt verbalizes understanding.       Standing Knee Flexion AROM with Chair Support - 10 reps - 2 sets - 1x daily - 5x weekly Standing Hip Extension with Counter Support - 10 reps - 2 sets - 1x daily - 5x weekly Standing Hip Abduction with Counter Support - 10 reps - 2 sets - 1x daily - 5x weekly Walking March - 10 reps - 2 sets - 1x daily - 5x weekly/marching in place 2 sets x 10 reps        PT Education - 01/30/20 1715    Education Details  Progress towards  goals, POC and plans for d/c this visit.  Added stride stance sit<>stand to HEP; advised pt to continue his current HEP, making sure to do 2 sets of 10 reps    Person(s) Educated  Patient    Methods  Explanation;Demonstration;Handout    Comprehension  Verbalized understanding       PT Short Term Goals - 12/26/19 1033      PT SHORT TERM GOAL #1   Title  Pt will be independent with HEP for improved balance, strength and gait.    Time  4    Period  Weeks    Status  Achieved    Target Date  12/27/19      PT SHORT TERM GOAL #2   Title  Pt will increase 30 sec sit to stand from 6 to 8 or more for improved functional strength and mobility.    Baseline  6 on 11/26/2019; 7 times 12/26/2019    Time  4    Period  Weeks    Status  Not Met    Target Date  12/27/19      PT SHORT TERM GOAL #3   Title  Pt will ambulate >500' on varied surfaces with SPC mod I for improved community mobility and activity tolerance.    Baseline  500 ft supervision/min guard (compliant surfaces) with cane    Time  4    Period  Weeks    Status  Partially Met    Target Date  12/27/19        PT Long Term Goals - 01/30/20 1720      PT LONG TERM GOAL #1   Title  Pt will increase Berg Balance score from 44/56 to >47/56 for improved balance and decreased fall risk.    Baseline  44/56 on 11/26/2019; 47/56 01/27/2020    Time  8    Period  Weeks    Status  Achieved      PT LONG TERM GOAL #2   Title  Pt will increase gait speed from 0.86ms to >0.735m for improved gait safety in the community.    Baseline  0.5316mon 11/26/2019; 0.61 m/sec 01/27/2020    Time  8    Period  Weeks    Status  Not Met      PT LONG TERM GOAL #3   Title  Pt will ambulate >1000' with SPC versus no device on all surfaces for improved gait  safety.    Baseline  600 ft SPC and supervision    Time  8    Period  Weeks    Status  Not Met            Plan - 01/30/20 1720    Clinical Impression Statement  LTG 3 not fully met, with pt ambulating  600 ft on outdoor surfaces with cane with supervision.  He reports typically having family supervision when ambulating outdoors.  He demonstrates independence with HEP, but does need cues for increasing reps and to continue HEP upon d/c from PT.  Pt feels comfortable with his current funcitonal level, and pt is appropriate for d/c at this time.    Personal Factors and Comorbidities  Comorbidity 3+    Comorbidities  DM, HTN, afib with loop recorder    Examination-Activity Limitations  Locomotion Level    Examination-Participation Restrictions  Yard Work;Community Activity    Stability/Clinical Decision Making  Evolving/Moderate complexity    Rehab Potential  Good    PT Frequency  2x / week   plus eval   PT Duration  8 weeks    PT Treatment/Interventions  ADLs/Self Care Home Management;Gait training;Stair training;Functional mobility training;Therapeutic activities;Therapeutic exercise;Patient/family education;Neuromuscular re-education;Balance training;Manual techniques    PT Next Visit Plan  Discharge PT this visit.    Consulted and Agree with Plan of Care  Patient       Patient will benefit from skilled therapeutic intervention in order to improve the following deficits and impairments:  Abnormal gait, Decreased activity tolerance, Decreased balance, Decreased mobility, Decreased strength  Visit Diagnosis: Other abnormalities of gait and mobility  Unsteadiness on feet     Problem List Patient Active Problem List   Diagnosis Date Noted  . 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 Acquired thrombophilia ([place]) 09/09/2019  . Abdominal aortic atherosclerosis ([place]) 09/09/2019  . Late effect of cerebrovascular accident (CVA) 03/28/2019  . CKD stage 2 due to type 2 diabetes mellitus ([place]) 12/20/2017  . Chronic anticoagulation 09/04/2017  . Labile blood glucose   . Hypoalbuminemia due to protein-calorie malnutrition ([place])   . Ataxia, post-stroke   . History of CVA with residual deficit    . Hypothyroidism 09/13/2016  . Medicare annual wellness visit, subsequent 08/28/2015  . Vitamin D deficiency 04/29/2014  . Medication management 01/03/2014  . Type 2 diabetes mellitus ([place])   . BPH (benign prostatic hyperplasia)   . 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 Essential hypertension 11/07/2013  . Hyperlipidemia associated with type 2 diabetes mellitus ([place]) 11/07/2013  . Paroxysmal atrial fibrillation ([place]) 09/18/2012    Clotine Heiner W. 01/30/2020, 5:27 PM  Frazier Butt., PT   [facility] [address] [place], [place], [address] Phone: [phone]   Fax:  [phone]  Name: Spiros Greenfeld MRN: [id number] Date of Birth: [birth year]   PHYSICAL THERAPY DISCHARGE SUMMARY  Visits from Start of Care: 13  Current functional level related to goals / functional outcomes: PT Long Term Goals - 01/30/20 1720      PT LONG TERM GOAL #1   Title  Pt will increase Berg Balance score from 44/56 to >47/56 for improved balance and decreased fall risk.    Baseline  44/56 on 11/26/2019; 47/56 01/27/2020    Time  8    Period  Weeks    Status  Achieved      PT LONG TERM GOAL #2   Title  Pt will increase gait speed from 0.16ms  to >0.22ms for improved gait safety in the community.    Baseline  0.557m on 11/26/2019; 0.61 m/sec 01/27/2020    Time  8    Period  Weeks    Status  Not Met      PT LONG TERM GOAL #3   Title  Pt will ambulate >1000' with SPC versus no device on all surfaces for improved gait safety.    Baseline  600 ft SPC and supervision    Time  8    Period  Weeks    Status  Not Met         Remaining deficits: Endurance, strength, balance-improving   Education / Equipment: Educated in HESharonwith pt return demo understanding.  Plan: Patient agrees to discharge.  Patient goals were partially met. Patient is being discharged due to being pleased with the current functional  level.  ?????   AmMady HaagensenPT 01/30/20 5:29 PM Phone: [phone]: [phone]

## 2020-02-02 NOTE — Progress Notes (Addendum)
Virtual Visit via Telephone Note  I connected with Earl [name] on 01/23/20 at 12:00 PM EST by telephone and verified that I am speaking with the correct person using two identifiers.   I discussed the limitations, risks, security and privacy concerns of performing an evaluation and management service by telephone and the availability of in person appointments. I also discussed with the patient that there may be a patient responsible charge related to this service. The patient expressed understanding and agreed to proceed.   Assessment and Plan:  Earl [name] was seen today for follow-up.  Diagnoses and all orders for this visit:  Tachycardia Continue carvedilol 3.'125mg'$  BID Continue t o monitor B/P & pulse  Essential hypertension Monitor blood pressure at home; call if consistently over 130/80 Continue DASH diet.   Reminder to go to the ER if any CP, SOB, nausea, dizziness, severe HA, changes vision/speech, left arm numbness and tingling and jaw pain.  Bilateral lower extremity edema Improved Continue Lasix '40mg'$  daily with Potassium 27mq Close follow up for CMP and OV Discussed monitoring daily weights  Shortness of breath Improved since reduction of fluid Continue to monitor    Follow Up Instructions:  Discussed assessment and treatment plan with the patient. The patient was provided an opportunity to ask questions and all were answered. The patient agrees with the plan of care and demonstrates an understanding of the instructions.   The patient was advised to call back or seek an in-person evaluation if the symptoms worsen or if the condition fails to improve as anticipated.  I provided 20 minutes of non-face-to-face time during this encounter including interview, counseling, chart review, and critical decision making was preformed.   Future Appointments  Date Time Provider DLamar 02/04/2020  8:45 AM MGarnet Sierras NP [facility] None  02/24/2020  9:15  AM [facility] DEVICE REMOTES [facility]  03/19/2020 10:45 AM MGarnet Sierras NP [facility] None  03/26/2020  8:40 AM [facility] DEVICE REMOTES [facility]  03/31/2020 11:15 AM MGarnet Sierras NP [facility] None  04/27/2020  8:40 AM [facility] DEVICE REMOTES [facility]  05/12/2020 10:20 AM PJamse Arn MD [facility] CPR  05/28/2020  8:40 AM [facility] DEVICE REMOTES [facility]  06/29/2020  9:05 AM [facility] DEVICE REMOTES [facility]  07/13/2020 10:00 AM MUnk Pinto MD [facility] None  07/30/2020  8:40 AM [facility] DEVICE REMOTES [facility]  08/05/2020 10:45 AM MFrann Rider NP [facility] None  08/31/2020  8:40 AM [facility] DEVICE REMOTES [facility]  10/01/2020  8:50 AM [facility] DEVICE REMOTES [facility]  11/02/2020  8:40 AM [facility] DEVICE REMOTES [facility]  01/01/2021 10:00 AM [facility] LAB 1 [facility] None  01/08/2021  9:30 AM Shadad, FMathis Dad MD [facility] None    ------------------------------------------------------------------------------------------------------------------   HPI 79y.o.male presents for evaluation of start of blood pressure medication.  This is follow up after starting lasix '40mg'$  daily w/ postassium 224m replacement.  He was having significant BLE edema, +2 in office with accompanying shortness of breath and tachycardia with elevated blood pressures. Since start of medication his edema has improved as well at his shortness of breath.  He reports that he feels great and he is participating in physical therapy and no longer has limitations he had in the past. He was also started on Carvedilol 3.'125mg'$  BID related to hypertensive readings.  Looking back he has been hypertensive and borderline for some time.  He was taking lisinopril at one time but has having some dizzy type episodes and mediation was stopped  though he did not have  reduction in episodes.  He also has history of CVA and on eliquis.  Past Medical History:  Diagnosis Date  . Adenomatous colon polyp   . Allergy   . Anal fissure   . Anxiety   . Arthritis   . BPH (benign prostatic hyperplasia)   . Cataract   . Diabetes mellitus ([place])   . GERD (gastroesophageal reflux disease)   . Hyperlipidemia   . Hypertension   . Hypothyroidism   . Other testicular hypofunction   . Stroke ([place])   . Type II or unspecified type diabetes mellitus without mention of complication, not stated as uncontrolled      Allergies  Allergen Reactions  . Penicillins Other (See Comments)    Current Outpatient Medications on File Prior to Visit  Medication Sig  . ALPRAZolam (XANAX) 1 MG tablet Take 1/2-1 tablet at Bedtime  ONLY if needed for Sleep &  limit to 5 days /week to avoid Addiction & Dementia  . apixaban (ELIQUIS) 5 MG TABS tablet Take 1 tablet 2 x /day to Prevent Blood Clots  . atorvastatin (LIPITOR) 40 MG tablet Take 1 tablet at bedtime for cholestaerol.  . Blood Glucose Monitoring Suppl (ONE TOUCH ULTRA 2) w/Device KIT Check blood sugar 1 time a day-DX-E11.9  . carvedilol (COREG) 3.125 MG tablet Take 1 tablet (3.125 mg total) by mouth 2 (two) times daily.  . Cholecalciferol (VITAMIN D PO) Take 1 tablet by mouth daily.   . Cyanocobalamin (B-12) 50 MCG TABS Take 50 mcg by mouth daily.  . finasteride (PROSCAR) 5 MG tablet Take 1 tablet Daily for Prostate  . furosemide (LASIX) 20 MG tablet Take 1 tablet (20 mg total) by mouth daily.  [name] gabapentin (NEURONTIN) 100 MG capsule Take 1 to 3 capsules 1 hour before Bedtime for Diabetic Neuropathy Pains  . glipiZIDE (GLUCOTROL) 5 MG tablet Take 1 tablet 2 x /day with Meals for Diabetes  . glucose blood (ONETOUCH ULTRA) test strip Check blood sugar 1 time a day-DX-E11.9  . Lancets Misc. (ONE TOUCH SURESOFT) MISC Check blood sugar 1 time a day-DX-E11.91  . levothyroxine (SYNTHROID) 50 MCG tablet Take 1 tablet daily on an empty  stomach with only water for 30 minutes & no Antacid meds, Calcium or Magnesium for 4 hours & avoid Biotin  . Magnesium 250 MG TABS Take 250 mg by mouth daily.   . metFORMIN (GLUCOPHAGE-XR) 500 MG 24 hr tablet Take 2 tablets 2 x  /day with Meals for Diabetes  . Multiple Vitamin (MULTIVITAMIN) capsule Take 1 capsule by mouth daily.    [name] omeprazole (PRILOSEC) 40 MG capsule Take 1 capsule (40 mg total) by mouth daily.  . potassium chloride SA (KLOR-CON) 20 MEQ tablet Take one tablet by mouth daily with furosemide (lasix).  Take with food.  . vitamin C (ASCORBIC ACID) 500 MG tablet Take 500 mg by mouth daily.     No current facility-administered medications on file prior to visit.    ROS: all negative except above.   Physical Exam:  BP 111/69   Pulse 85   Wt 175 lb 3.2 oz (79.5 kg)   BMI 23.76 kg/m   General : Well sounding patient in no apparent distress HEENT: no hoarseness, no cough for duration of visit Lungs: speaks in complete sentences, no audible wheezing, no apparent distress Neurological: alert, oriented x 3 Psychiatric: pleasant, judgement appropriate  Garnet Sierras, NP 12:10 PM [facility]

## 2020-02-03 NOTE — Progress Notes (Signed)
Assessment and Plan:  Earl [name] was seen today for follow-up.  Diagnoses and all orders for this visit:  Tachycardia Improved Continue carvedilol 3.'125mg'$  BID Continue to monitor B/P & pulse  Essential hypertension Monitor blood pressure at home; call if consistently over 130/80 Continue DASH diet.   Reminder to go to the ER if any CP, SOB, nausea, dizziness, severe HA, changes vision/speech, left arm numbness and tingling and jaw pain.  Bilateral lower extremity edema Improved but not resolved Continue Lasix '40mg'$  daily with Potassium 33mq Will check CMP today Not convinced diuresis is occurring, consider increase to '80mg'$ ? Discussed monitoring daily weights Continue compression stockings.  Shortness of breath Improved since reduction of fluid Still present with increased exertion Continue to monitor Close follow up  Patient to call with new or worsening symptoms    I provided 20 minutes of face-to-face time during this encounter including interview, counseling, chart review, and critical decision making was preformed.   Future Appointments  Date Time Provider DOdebolt 02/24/2020  9:15 AM [facility] DEVICE REMOTES [facility]  03/19/2020 10:45 AM MGarnet Sierras NP [facility] None  03/26/2020  8:40 AM [facility] DEVICE REMOTES [facility]  03/31/2020 11:15 AM MGarnet Sierras NP [facility] None  04/27/2020  8:40 AM [facility] DEVICE REMOTES [facility]  05/12/2020 10:20 AM PJamse Arn MD [facility] CPR  05/28/2020  8:40 AM [facility] DEVICE REMOTES [facility]  06/29/2020  9:05 AM [facility] DEVICE REMOTES [facility]  07/13/2020 10:00 AM MUnk Pinto MD [facility] None  07/30/2020  8:40 AM [facility] DEVICE REMOTES [facility]  08/05/2020 10:45 AM MFrann Rider NP [facility] None  08/31/2020  8:40 AM [facility] DEVICE REMOTES [facility]  10/01/2020  8:50 AM  [facility] DEVICE REMOTES [facility]  11/02/2020  8:40 AM [facility] DEVICE REMOTES [facility]  01/01/2021 10:00 AM [facility] LAB 1 [facility] None  01/08/2021  9:30 AM Shadad, FMathis Dad MD [facility] None    ------------------------------------------------------------------------------------------------------------------   HPI 79y.o.male presents for evaluation of start of blood pressure medication and follow up on shortness of breath and BLE.  Last OV was telephone visit on 01/23/20:   This is follow up after starting lasix '40mg'$  daily w/ postassium 289m replacement.  He was having significant BLE edema, +2 in office with accompanying shortness of breath and tachycardia with elevated blood pressures. Since start of medication his edema has improved as well at his shortness of breath.  He reports that he feels great and he is participating in physical therapy and no longer has limitations he had in the past. He was also started on Carvedilol 3.'125mg'$  BID related to hypertensive readings.  Looking back he has been hypertensive and borderline for some time.  He was taking lisinopril at one time but has having some dizzy type episodes and mediation was stopped though he did not have reduction in episodes.  He also has history of CVA and on eliquis.   Today he reports that he still has the swelling even after he rests at night.  He has been wearing compressing stockings every day and taking them off at night.  The swelling does not resolve and it is worse on the right compared to the left.  He started '40mg'$  of lasix with potassium once daily.  He reports some increase in urination but not huge improvement since last OV.  He was also started on Carvedilol 3.'125mg'$  BID and doing well, his blood pressure have improved and he has been checking his blood pressure twice  a day and brought a log with him today.  These range from 130-113 over 84-69.  He has DMII and taking  Metformin 542m two tablets twice a day as well as glipizide 552mBID with meals.  His blood glucose is well controlled and he check this three times a week.  He fasting blood glucose ranges from 102-130's with one outlier at 140.  His last A1c was 6.3.    He continues to have a "woozy" feeling that has been chronic.  His lisinopril was stropped related to this but the feeling did not subside.    Past Medical History:  Diagnosis Date  . Adenomatous colon polyp   . Allergy   . Anal fissure   . Anxiety   . Arthritis   . BPH (benign prostatic hyperplasia)   . Cataract   . Diabetes mellitus ([place]  . GERD (gastroesophageal reflux disease)   . Hyperlipidemia   . Hypertension   . Hypothyroidism   . Other testicular hypofunction   . Stroke ([place]  . Type II or unspecified type diabetes mellitus without mention of complication, not stated as uncontrolled      Allergies  Allergen Reactions  . Penicillins Other (See Comments)    Current Outpatient Medications on File Prior to Visit  Medication Sig  . ALPRAZolam (XANAX) 1 MG tablet Take 1/2-1 tablet at Bedtime  ONLY if needed for Sleep &  limit to 5 days /week to avoid Addiction & Dementia  . apixaban (ELIQUIS) 5 MG TABS tablet Take 1 tablet 2 x /day to Prevent Blood Clots  . atorvastatin (LIPITOR) 40 MG tablet Take 1 tablet at bedtime for cholestaerol.  . Blood Glucose Monitoring Suppl (ONE TOUCH ULTRA 2) w/Device KIT Check blood sugar 1 time a day-DX-E11.9  . carvedilol (COREG) 3.125 MG tablet Take 1 tablet (3.125 mg total) by mouth 2 (two) times daily.  . Cholecalciferol (VITAMIN D PO) Take 1 tablet by mouth daily.   . Cyanocobalamin (B-12) 50 MCG TABS Take 50 mcg by mouth daily.  . finasteride (PROSCAR) 5 MG tablet Take 1 tablet Daily for Prostate  . furosemide (LASIX) 20 MG tablet Take 1 tablet (20 mg total) by mouth daily.  . Marland Kitchenabapentin (NEURONTIN) 100 MG capsule Take 1 to 3 capsules 1 hour before Bedtime for Diabetic Neuropathy  Pains  . glipiZIDE (GLUCOTROL) 5 MG tablet Take 1 tablet 2 x /day with Meals for Diabetes  . glucose blood (ONETOUCH ULTRA) test strip Check blood sugar 1 time a day-DX-E11.9  . Lancets (ONETOUCH DELICA PLUS [id number] CHECK BLOOD SUGAR ONCE A DAY.  . Marland Kitchenancets Misc. (ONE TOUCH SURESOFT) MISC Check blood sugar 1 time a day-DX-E11.91  . levothyroxine (SYNTHROID) 50 MCG tablet Take 1 tablet daily on an empty stomach with only water for 30 minutes & no Antacid meds, Calcium or Magnesium for 4 hours & avoid Biotin  . Magnesium 250 MG TABS Take 250 mg by mouth daily.   . metFORMIN (GLUCOPHAGE-XR) 500 MG 24 hr tablet Take 2 tablets 2 x  /day with Meals for Diabetes  . Multiple Vitamin (MULTIVITAMIN) capsule Take 1 capsule by mouth daily.    . Marland Kitchenmeprazole (PRILOSEC) 40 MG capsule Take 1 capsule (40 mg total) by mouth daily.  . potassium chloride SA (KLOR-CON) 20 MEQ tablet Take one tablet by mouth daily with furosemide (lasix).  Take with food.  . vitamin C (ASCORBIC ACID) 500 MG tablet Take 500 mg by mouth daily.  No current facility-administered medications on file prior to visit.    ROS: all negative except above.   Physical Exam:  BP 126/66   Pulse 89   Temp (!) 97.3 F (36.3 C)   Ht 6' 1.5" (1.867 m)   Wt 184 lb (83.5 kg)   SpO2 99%   BMI 23.95 kg/m   Physical Exam:  BP 126/66   Pulse 89   Temp (!) 97.3 F (36.3 C)   Ht 6' 1.5" (1.867 m)   Wt 184 lb (83.5 kg)   SpO2 99%   BMI 23.95 kg/m   General Appearance: Well nourished, in no apparent distress. Eyes: PERRLA, EOMs, conjunctiva no swelling or erythema Sinuses: No Frontal/maxillary tenderness ENT/Mouth: Ext aud canals clear, TMs without erythema, bulging. No erythema, swelling, or exudate on post pharynx.  Tonsils not swollen or erythematous. Hearing normal.  Neck: Supple, thyroid normal.  Respiratory: Respiratory effort normal, BS equal bilaterally without rales, rhonchi, wheezing or stridor.  Cardio: RRR with no  MRGs. Brisk peripheral pulses +2 pitting edema, Right worse than left. Musculoskeletal: Full ROM, 5/5 strength, normal gait.  Skin: Warm, dry without rashes, lesions; he has fragile skin and numerous small ecchymoses to bilateral upper extremities Neuro: Cranial nerves intact. Normal muscle tone, no cerebellar symptoms. Sensation intact.  Psych: Awake and oriented X 3, normal affect, Insight and Judgment appropriate.   Garnet Sierras, NP 2:02 PM [facility]

## 2020-02-04 ENCOUNTER — Other Ambulatory Visit: Payer: Self-pay

## 2020-02-04 ENCOUNTER — Encounter: Payer: Self-pay | Admitting: Adult Health Nurse Practitioner

## 2020-02-04 ENCOUNTER — Ambulatory Visit (INDEPENDENT_AMBULATORY_CARE_PROVIDER_SITE_OTHER): Payer: Medicare Other | Admitting: Adult Health Nurse Practitioner

## 2020-02-04 VITALS — BP 126/66 | HR 89 | Temp 97.3°F | Ht 73.5 in | Wt 184.0 lb

## 2020-02-04 DIAGNOSIS — R0602 Shortness of breath: Secondary | ICD-10-CM

## 2020-02-04 DIAGNOSIS — I10 Essential (primary) hypertension: Secondary | ICD-10-CM

## 2020-02-04 DIAGNOSIS — R000 Tachycardia, unspecified: Secondary | ICD-10-CM

## 2020-02-04 DIAGNOSIS — R600 Localized edema: Secondary | ICD-10-CM | POA: Diagnosis not present

## 2020-02-04 LAB — CBC WITH DIFFERENTIAL/PLATELET
Absolute Monocytes: 528 cells/uL (ref 200–950)
Basophils Absolute: 28 cells/uL (ref 0–200)
Basophils Relative: 0.5 %
Eosinophils Absolute: 88 cells/uL (ref 15–500)
Eosinophils Relative: 1.6 %
HCT: 36.8 % — ABNORMAL LOW (ref 38.5–50.0)
Hemoglobin: 12.3 g/dL — ABNORMAL LOW (ref 13.2–17.1)
Lymphs Abs: 1771 cells/uL (ref 850–3900)
MCH: 28.6 pg (ref 27.0–33.0)
MCHC: 33.4 g/dL (ref 32.0–36.0)
MCV: 85.6 fL (ref 80.0–100.0)
MPV: 11.8 fL (ref 7.5–12.5)
Monocytes Relative: 9.6 %
Neutro Abs: 3086 cells/uL (ref 1500–7800)
Neutrophils Relative %: 56.1 %
Platelets: 177 10*3/uL (ref 140–400)
RBC: 4.3 10*6/uL (ref 4.20–5.80)
RDW: 14 % (ref 11.0–15.0)
Total Lymphocyte: 32.2 %
WBC: 5.5 10*3/uL (ref 3.8–10.8)

## 2020-02-04 LAB — COMPLETE METABOLIC PANEL WITH GFR
AG Ratio: 2.3 (calc) (ref 1.0–2.5)
ALT: 93 U/L — ABNORMAL HIGH (ref 9–46)
AST: 44 U/L — ABNORMAL HIGH (ref 10–35)
Albumin: 3.6 g/dL (ref 3.6–5.1)
Alkaline phosphatase (APISO): 79 U/L (ref 35–144)
BUN: 20 mg/dL (ref 7–25)
CO2: 26 mmol/L (ref 20–32)
Calcium: 8.8 mg/dL (ref 8.6–10.3)
Chloride: 107 mmol/L (ref 98–110)
Creat: 1.12 mg/dL (ref 0.70–1.18)
GFR, Est African American: 73 mL/min/{1.73_m2} (ref >=60)
GFR, Est Non African American: 63 mL/min/{1.73_m2} (ref >=60)
Globulin: 1.6 g/dL (calc) — ABNORMAL LOW (ref 1.9–3.7)
Glucose, Bld: 180 mg/dL — ABNORMAL HIGH (ref 65–99)
Potassium: 4.5 mmol/L (ref 3.5–5.3)
Sodium: 141 mmol/L (ref 135–146)
Total Bilirubin: 0.8 mg/dL (ref 0.2–1.2)
Total Protein: 5.2 g/dL — ABNORMAL LOW (ref 6.1–8.1)

## 2020-02-04 NOTE — Patient Instructions (Addendum)
   We will contact you via phone with your lab results in 1-3 days and talk about changes to your lasix.  Continue to monitor your blood pressure.  Check these couple times a week AND IF you feel bad.  Continue to wear your compression stockings daily, put on in the morning ad take off at night.  Please contact our office if swelling does not go down overnight.   Be sure you are drinking at least 40 ounces of fluid a day.   STOP the Glipizide for one week.  Check your blood sugar when you feel whoozy.  If stopping makes no difference then restart Glipizide 5mg  only in morning.     Please contact office with any new or worsening symptoms.

## 2020-02-05 ENCOUNTER — Other Ambulatory Visit: Payer: Self-pay | Admitting: Adult Health Nurse Practitioner

## 2020-02-05 DIAGNOSIS — R600 Localized edema: Secondary | ICD-10-CM

## 2020-02-05 MED ORDER — FUROSEMIDE 80 MG PO TABS: 80.0000 mg | ORAL_TABLET | Freq: Every day | ORAL | 0 refills | Status: DC

## 2020-02-05 NOTE — Progress Notes (Signed)
Please contact patient with lab results, he does not check MyChart but his family does. -Blood count stable.  Adding iron supplement, Ferrous Sulfate will help to boost your hemoglobin level.  When low can make you feel weak, tired, short of breath.  Take this with food and 2-4 hours apart from your levothyroxine (synthroid) medication.   Dinner is a good time to take this. Take your Ascorbic acid(Vitamin C) with the iron.  This will help your body to absorb the iron.  Biggest side effect of iron is constipation.  Kidney function improved form last check.  Protein remains on low side.  Be sure you are getting sources of protein in the foods you eat, lean meats like chicken, fish, nuts (unsalted), peanut butter.  Liver function improved from last check, we will keep monitoring this.  Medication increase:  Lasix 80mg  daily in morning with potassium.  If he has 20mg  tablets still (He is currently taking 20mg , two tablets(40mg ), he can take two more today to equal 80mg )  Then start new tablets in morning, sent to [facility] 90day supply.  Should be low in cost.  This should significantly increase the amount you urinate to decrease fluid in legs.  Have him contact us with any new or worsening symptoms.  Schedule him, in office appointment next Tuesday with me for close follow up.  Sincerely,           Garnet Sierras, NP

## 2020-02-05 NOTE — Progress Notes (Signed)
Please contact patient with lab results, he does not check MyChart but his family does. -Blood count stable.  Adding iron supplement, Ferrous Sulfate will help to boost your hemoglobin level.  When low can make you feel weak, tired, short of breath.  Take this with food and 2-4 hours apart from your levothyroxine (synthroid) medication.   Dinner is a good time to take this. Take your Ascorbic acid(Vitamin C) with the iron.  This will help your body to absorb the iron.  Biggest side effect of iron is constipation.  Kidney function improved form last check.  Protein remains on low side.  Be sure you are getting sources of protein in the foods you eat, lean meats like chicken, fish, nuts (unsalted), peanut butter.  Liver function improved from last check, we will keep monitoring this.  Medication increase:  Lasix 80mg  daily in morning with potassium.  If he has 20mg  tablets still (He is currently taking 20mg , two tablets(40mg ), he can take two more today to equal 80mg )  Then start new tablets in morning, sent to [place] supply.  Should be low in cost.  If this is effective we can send next refil 90days to mail order. This should significantly increase the amount you urinate to decrease fluid in legs.  Have him contact us with any new or worsening symptoms.  Schedule him, in office appointment next Tuesday with me for close follow up.  Sincerely,           Garnet Sierras, NP

## 2020-02-11 ENCOUNTER — Ambulatory Visit: Payer: Medicare Other

## 2020-02-11 ENCOUNTER — Other Ambulatory Visit: Payer: Self-pay

## 2020-02-11 DIAGNOSIS — R2689 Other abnormalities of gait and mobility: Secondary | ICD-10-CM | POA: Diagnosis not present

## 2020-02-11 DIAGNOSIS — R2681 Unsteadiness on feet: Secondary | ICD-10-CM | POA: Diagnosis not present

## 2020-02-11 DIAGNOSIS — R1312 Dysphagia, oropharyngeal phase: Secondary | ICD-10-CM

## 2020-02-11 NOTE — Patient Instructions (Addendum)
   During your swallow test, when things went into your windpipe, or were stuck in your throat, you did not feel them.  Therefore, Tammy said you should do this when you eat or drink:  Take smaller bites and smaller sips TUCK YOUR CHIN with all swallows Swallow hard Clear your throat and reswallow intermittently (10-15 times) during each mealtime  ==========================================  Stay away from dry or crumbly items, like crackers, crusty bread, hard cookies, nabs - unless you dunk them in something first   SWALLOWING EXERCISES   1. Effortful Swallows - Press your tongue against the roof of your mouth for 3 seconds, then squeeze the muscles in your neck while you swallow your saliva or a sip of water - Repeat 10-15 times, 2-3 times a day, and use whenever you eat or drink  2. Masako Swallow - swallow with your tongue sticking out - Stick tongue out past your teeth and gently bite tongue with your teeth - Swallow, while holding your tongue with your teeth - Repeat 10-15 times, 2-3 times a day *use a wet spoon if your mouth gets dry*  3. Breath Hold - Say "HUH!" loudly, then hold your breath for 3 seconds at your voice box - Repeat 20 times, 2-3 times a day        4.  Open mouth swallow  - Open your mouth and swallow your saliva  - Repeat 10 times, 2-3 times a day

## 2020-02-11 NOTE — Therapy (Signed)
[facility] [address] [place], [place], [address] Phone: [phone]   Fax:  [phone]  Speech Language Pathology Evaluation  Patient Details  Name: Earl [name] MRN: [id number] Date of Birth: [birth year] Referring Provider (SLP): Vicie Mutters, [place]   Encounter Date: 02/11/2020  End of Session - 02/11/20 2324    Visit Number  1    Number of Visits  17    Date for SLP Re-Evaluation  05/11/20    SLP Start Time  1103    SLP Stop Time   1147    SLP Time Calculation (min)  44 min    Activity Tolerance  Patient tolerated treatment well       Past Medical History:  Diagnosis Date  . Adenomatous colon polyp   . Allergy   . Anal fissure   . Anxiety   . Arthritis   . BPH (benign prostatic hyperplasia)   . Cataract   . Diabetes mellitus ([place])   . GERD (gastroesophageal reflux disease)   . Hyperlipidemia   . Hypertension   . Hypothyroidism   . Other testicular hypofunction   . Stroke ([place])   . Type II or unspecified type diabetes mellitus without mention of complication, not stated as uncontrolled     Past Surgical History:  Procedure Laterality Date  . CIRCUMCISION N/A 06/11/2019   Procedure: CIRCUMCISION ADULT;  Surgeon: [name], MD;  Location: [facility];  Service: Urology;  Laterality: N/A;  . COLONOSCOPY    . INGUINAL HERNIA REPAIR Bilateral    1968 and 2004  . LOOP RECORDER INSERTION N/A 07/28/2017   Procedure: LOOP RECORDER INSERTION;  Surgeon: Deboraha Sprang, MD;  Location: [facility];  Service: Cardiovascular;  Laterality: N/A;  . TEE WITHOUT CARDIOVERSION N/A 07/28/2017   Procedure: TRANSESOPHAGEAL ECHOCARDIOGRAM (TEE);  Surgeon: Sueanne Margarita, MD;  Location: [facility] ENDOSCOPY;  Service: Cardiovascular;  Laterality: N/A;    There were no vitals filed for this visit.  Subjective Assessment - 02/11/20 1105    Subjective  "I just tip my head back when I take that Metformin and it  goes right down."    Currently in Pain?  No/denies         SLP Evaluation [facility] - 02/11/20 2325      SLP Visit Information   SLP Received On  02/11/20    Referring Provider (SLP)  Vicie Mutters, PA-C    Onset Date  2018    Medical Diagnosis  dysphagia      General Information   HPI  CVA 2018. Incr'd dysphagia and dysarthria reported by children. MBSS completed January 2021 recommending dys III/thin with precautions and dysphagia tx.        Prior Functional Status - 02/11/20 2320      Prior Functional Status   Cognitive/Linguistic Baseline  Baseline deficits    Type of Home  House    Available Help at Discharge  Family    Vocation  Retired          Thin Liquid - 02/11/20 2320      Thin Liquid   Presentation  Cup    Other Comments  SLP provided pt compensatory strategies as a result of his modified (MBSS), which pt stated he did not recall. When using compensations, pt without overt s/sx oral or pharyngeal dysphagia         Solid - 02/11/20 2322      Solid   Solid  Within functional limits  Presentation  Self Fed    Other Comments  SLP provided pt compensatory strategies as a result of his modified (MBSS), which pt stated he did not recall. When using compensations, pt without overt s/sx oral or pharyngeal dysphagia with a cereal bar. Pt req'd cues from SLP x1 with solids but independent otherwise (noting that he had compensations written in front of him by SLP)          SLP Education - 02/11/20 2344    Education Details  HEP procedure, rationale for HEP and for aspiration precautions, what is asp PNA    Person(s) Educated  Patient    Methods  Explanation;Demonstration;Verbal cues;Handout    Comprehension  Verbalized understanding;Returned demonstration;Verbal cues required;Need further instruction       SLP Short Term Goals - 02/11/20 2340      SLP SHORT TERM GOAL #1   Title  pt will perform HEP with rare min A over two sessions    Time  3     Period  Weeks    Status  New      SLP SHORT TERM GOAL #2   Title  pt will follow aspiration precautions with rare min A over three sessions    Time  3    Period  Weeks    Status  New      SLP SHORT TERM GOAL #3   Title  pt will tell SLP 3 overt s/sx aspiration PNA with modified independence    Time  3    Period  Weeks    Status  New      SLP SHORT TERM GOAL #4   Title  pt will tell SLP rationale for completing HEP (strengthen muscles for swallowing, limit/eiliminate aspiration&penetration)    Time  3    Period  Weeks    Status  New       SLP Long Term Goals - 02/11/20 2341      SLP LONG TERM GOAL #1   Title  pt will follow aspiration precautions with modified independence in 4 therapy sessions    Time  8    Period  Weeks   or 17 sessions, for all LTGs   Status  New      SLP LONG TERM GOAL #2   Title  pt will complete HEP for dysphagia with modified independence over three sessions    Time  [place] - 02/11/20 2325    Clinical Impression Statement  Pt presents today with mild oropharyngeal/cervical esophageal dysphagia as I'd on 12-05-19 modified (MBSS). Pt had delayed trigger (in pyriforms) with liquids and also consistent penetration, and once, SILENTaspiration with liquids mixed with secretions following the swallow due to decr'd adequacy and timing of laryngeal closure. With solids, pt triggered swallow at vallecula, with decr'd sensation of pharyngeal residues. Dys III/thin was recommended with slow rate, small bites/sips, effortful swallow, throat clear intermittently, and chin tuck with all POs. Initially SLP had to explain rationale for swallow precautions, especially, given pt's "s" statement. SLP also felt necessary to provide pt with rationale for dysphagia HEP as pt questioned why he had to complete every exercise if all the exercises were for swallowing. Pt performed each exercise in "pt instructions" independently however began  with total A from SLP. Pt will benefit from skilled ST addressing swallowing deficit due to deconditioning (S/P CVA), in order to regularly assess adherence  to swallow precautions and assess accuracy of HEP.    Speech Therapy Frequency  2x / week    Duration  --   8 weeks or 17 sessions   Treatment/Interventions  Aspiration precaution training;Pharyngeal strengthening exercises;Diet toleration management by SLP;Trials of upgraded texture/liquids;Cueing hierarchy;Internal/external aids;Patient/family education;SLP instruction and feedback;Compensatory techniques    Potential to Achieve Goals  Good    Potential Considerations  Cooperation/participation level    SLP Home Exercise Plan  provided today    Consulted and Agree with Plan of Care  Patient       Patient will benefit from skilled therapeutic intervention in order to improve the following deficits and impairments:   Dysphagia, oropharyngeal phase    Problem List Patient Active Problem List   Diagnosis Date Noted  . 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 Acquired thrombophilia ([place]) 09/09/2019  . Abdominal aortic atherosclerosis ([place]) 09/09/2019  . Late effect of cerebrovascular accident (CVA) 03/28/2019  . 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 Chronic anticoagulation 09/04/2017  . Labile blood glucose   . Hypoalbuminemia due to protein-calorie malnutrition ([place])   . Ataxia, post-stroke   . History of CVA with residual deficit   . Hypothyroidism 09/13/2016  . Medicare annual wellness visit, subsequent 08/28/2015  . Vitamin D deficiency 04/29/2014  . Medication management 01/03/2014  . Type 2 diabetes mellitus ([place])   . BPH (benign prostatic hyperplasia)   . 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 Essential hypertension 11/07/2013  . Hyperlipidemia associated with type 2 diabetes mellitus ([place]) 11/07/2013  . Paroxysmal atrial fibrillation ([place]) 09/18/2012     [facility] 02/11/2020, 11:49 PM  [facility] [address] [place], [place], [address] Phone: [phone]   Fax:  [phone]  Name: Ryota Lorenzen MRN: [id number] Date of Birth: [birth year]

## 2020-02-13 ENCOUNTER — Other Ambulatory Visit: Payer: Self-pay

## 2020-02-13 ENCOUNTER — Encounter: Payer: Self-pay | Admitting: Adult Health Nurse Practitioner

## 2020-02-13 ENCOUNTER — Ambulatory Visit (INDEPENDENT_AMBULATORY_CARE_PROVIDER_SITE_OTHER): Payer: Medicare Other | Admitting: Adult Health Nurse Practitioner

## 2020-02-13 VITALS — BP 120/60 | HR 85 | Temp 97.5°F | Wt 193.0 lb

## 2020-02-13 DIAGNOSIS — R000 Tachycardia, unspecified: Secondary | ICD-10-CM | POA: Diagnosis not present

## 2020-02-13 DIAGNOSIS — R1312 Dysphagia, oropharyngeal phase: Secondary | ICD-10-CM

## 2020-02-13 DIAGNOSIS — R600 Localized edema: Secondary | ICD-10-CM

## 2020-02-13 DIAGNOSIS — D6869 Other thrombophilia: Secondary | ICD-10-CM | POA: Diagnosis not present

## 2020-02-13 DIAGNOSIS — R0600 Dyspnea, unspecified: Secondary | ICD-10-CM | POA: Diagnosis not present

## 2020-02-13 DIAGNOSIS — Z79899 Other long term (current) drug therapy: Secondary | ICD-10-CM

## 2020-02-13 DIAGNOSIS — I480 Paroxysmal atrial fibrillation: Secondary | ICD-10-CM

## 2020-02-13 DIAGNOSIS — Z8673 Personal history of transient ischemic attack (TIA), and cerebral infarction without residual deficits: Secondary | ICD-10-CM

## 2020-02-13 DIAGNOSIS — I10 Essential (primary) hypertension: Secondary | ICD-10-CM

## 2020-02-13 DIAGNOSIS — R0609 Other forms of dyspnea: Secondary | ICD-10-CM

## 2020-02-13 DIAGNOSIS — I700 Atherosclerosis of aorta: Secondary | ICD-10-CM

## 2020-02-13 DIAGNOSIS — N182 Chronic kidney disease, stage 2 (mild): Secondary | ICD-10-CM

## 2020-02-13 DIAGNOSIS — E1122 Type 2 diabetes mellitus with diabetic chronic kidney disease: Secondary | ICD-10-CM

## 2020-02-13 NOTE — Progress Notes (Signed)
FOLLOW UP EDEMA & EXERTIONAL DYSPNEA  Assessment and Plan:  Earl [name] was seen today for follow-up.  Diagnoses and all orders for this visit:  Tachycardia Continue carvedilol 3.'125mg'$  BID Continue to monitor B/P & pulse  Essential hypertension Monitor blood pressure at home; call if consistently over 130/80 or higher Continue DASH diet.   Reminder to go to the ER if any CP, SOB, nausea, dizziness, severe HA, changes vision/speech, left arm numbness and tingling and jaw pain.  Bilateral lower extremity edema Improved but remain present Continue Lasix '80mg'$  daily with Potassium 33mq Will reach out to cardiology  Discussed monitoring daily weights   Tachycardia Improved Carvedilol 3.'125mg'$  BID Continue to monitor blood pressure at home  Exertional dyspnea Persistent despite multiple interventions, concern for cardiac changes? Significant cardiac history Will reach out to cardiology for sooner evaluation, Echo?  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  Well controlled, last A1c 6.3 STOP glipizide, cause of "woozie" hypoglycemia Continue Metformin '500mg'$  2 tabs BID Monitor blood glucose and check if feeling bad.  Medication management Continued  Discussed assessment and treatment plan with the patient. The patient was provided an opportunity to ask questions and all were answered. The patient agrees with the plan of care and demonstrates  an understanding of the instructions.   The patient was advised to call back or seek an in-person evaluation if the symptoms worsen or if the condition fails to improve as anticipated.  I provided 30 minutes of face-to-face time during this encounter including interview,exam counseling, chart review, and critical decision making was preformed.   Future Appointments  Date Time Provider DRackerby 02/17/2020  1:15 PM [facility] 02/20/2020 11:00 AM BAliene Altes CCC-SLP [facility] 02/24/2020  9:15 AM [facility] DEVICE REMOTES [facility]  02/25/2020  8:00 AM SSharen Counter CCC-SLP OPRC-NR OPRCNR  03/02/2020  1:15 PM SSharen CounterCCC-SLP [facility] 03/04/2020  1:15 PM SSharen CounterCCC-SLP [facility] 03/09/2020  1:15 PM SSharen Counter CCC-SLP OPRC-NR OPRCNR  03/12/2020  2:00 PM SSharen CounterCCC-SLP OPRC-NR OPRCNR  03/17/2020  2:00 PM BAliene AltesCCC-SLP [facility] 03/19/2020 10:45 AM MGarnet Sierras NP GAAM-GAAIM None  03/24/2020  1:15 PM SSharen CounterCCC-SLP OPRC-NR OPRCNR  03/26/2020  8:40 AM [facility] DEVICE REMOTES [facility]  03/30/2020  2:00 PM SSharen CounterCCC-SLP [facility] 03/31/2020 11:15 AM MGarnet Sierras NP GAAM-GAAIM None  04/07/2020  1:15 PM SSharen Counter CCC-SLP OPRC-NR OPRCNR  04/27/2020  8:40 AM [facility] DEVICE REMOTES [facility]  05/12/2020 10:20 AM PJamse Arn MD CPR-PRMA CPR  05/28/2020  8:40 AM [facility] DEVICE REMOTES [facility]  06/29/2020  9:05 AM [facility] DEVICE REMOTES [facility]  07/13/2020 10:00 AM MUnk Pinto MD GAAM-GAAIM None  07/30/2020  8:40 AM [facility] DEVICE REMOTES [facility]  08/05/2020 10:45 AM MFrann Rider NP GNA-GNA None  08/31/2020  8:40 AM [facility] DEVICE REMOTES [facility]  10/01/2020  8:50 AM CVD[facility]  DEVICE REMOTES [facility]   11/02/2020  8:40 AM [facility] DEVICE REMOTES [facility]  01/01/2021 10:00 AM CHCC-MEDONC LAB 1 CHCC-MEDONC None  01/08/2021  9:30 AM Shadad, Earl Dad, MD CHCC-MEDONC None    ------------------------------------------------------------------------------------------------------------------   HPI 79 y.o.male presents for evaluation BLE edema and exertional dyspnea.  This is follow up after increasing lasix '80mg'$  daily w/ postassium 60mq replacement.  He was having significant BLE edema, +2 in office with accompanying exertional dyspnea and tachycardia with elevated blood pressures. Subsequent telephone follow up with reports of mild improvement in exertional dyspnea and BLE.  He does report his shoes are tight to get on.  He is wearing compression stockings daily.  He reports that he tires out quicker than normal.  He was also started on Carvedilol 3.'125mg'$  BID related to hypertensive readings.  Looking back he has been hypertensive and borderline for some time.  He was taking lisinopril at one time but has having some dizzy type episodes and medication was stopped though he did not have reduction in episodes.  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  He uses a rolling walker for gait stability for right ataxia sp CVA.  He is monitoring his blood pressure at home BID morning B/P range from 130-113 over 66-80 and evening B/P ranging from 131-118 over 65-85.  His pulse is ranging from 59-88.  He has started speech therapy.  He has trace dysphagia relate CVA.  He is going to go twice a week.  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  He has DMII and managed on oral medications.  Discussed at length likelihood of hyypoglycemia causing woozie episodes.  He does not check his blood glucose when he feels this way. He is taking metformin '500mg'$ XR two tablets twice a day and glipizide '5mg'$  BID.  He check fasting blood glucose 3 times a week related  to tight control. Ranges from 103-150 with one outlier of 175.  Past Medical History:  Diagnosis Date  . Adenomatous colon polyp   . Allergy   . Anal fissure   . Anxiety   . Arthritis   . BPH (benign prostatic hyperplasia)   . Cataract   . Diabetes mellitus ([place]   . GERD (gastroesophageal reflux disease)   . Hyperlipidemia   . Hypertension   . Hypothyroidism   . Other testicular hypofunction   . Stroke ([place]   . Type II or unspecified type diabetes mellitus without mention of complication, not stated as uncontrolled      Allergies  Allergen Reactions  . Penicillins Other (See Comments)    Current Outpatient Medications on File Prior to Visit  Medication Sig  . ALPRAZolam (XANAX) 1 MG tablet Take 1/2-1 tablet at Bedtime  ONLY if needed for Sleep &  limit to 5 days /week to avoid Addiction & Dementia  . apixaban (ELIQUIS) 5 MG TABS tablet Take 1 tablet 2 x /day to Prevent Blood Clots  . atorvastatin (LIPITOR) 40 MG tablet Take 1 tablet at bedtime for cholestaerol.  . Blood Glucose Monitoring Suppl (ONE TOUCH ULTRA 2) w/Device KIT Check blood sugar 1 time a day-DX-E11.9  . carvedilol (COREG) 3.125 MG tablet Take 1 tablet (3.125 mg total) by mouth 2 (two) times daily.  . Cholecalciferol (VITAMIN D PO) Take 1 tablet by mouth daily.   . Cyanocobalamin (B-12) 50 MCG TABS Take 50 mcg by mouth daily.  . finasteride (PROSCAR) 5 MG tablet  Take 1 tablet Daily for Prostate  . furosemide (LASIX) 80 MG tablet Take 1 tablet (80 mg total) by mouth daily.  [name] gabapentin (NEURONTIN) 100 MG capsule Take 1 to 3 capsules 1 hour before Bedtime for Diabetic Neuropathy Pains  . glipiZIDE (GLUCOTROL) 5 MG tablet Take 1 tablet 2 x /day with Meals for Diabetes  . glucose blood (ONETOUCH ULTRA) test strip Check blood sugar 1 time a day-DX-E11.9  . Lancets (ONETOUCH DELICA PLUS [id number]) MISC CHECK BLOOD SUGAR ONCE A DAY.  [name] Lancets Misc. (ONE TOUCH SURESOFT) MISC Check blood sugar 1 time a day-DX-E11.91   . levothyroxine (SYNTHROID) 50 MCG tablet Take 1 tablet daily on an empty stomach with only water for 30 minutes & no Antacid meds, Calcium or Magnesium for 4 hours & avoid Biotin  . Magnesium 250 MG TABS Take 250 mg by mouth daily.   . metFORMIN (GLUCOPHAGE-XR) 500 MG 24 hr tablet Take 2 tablets 2 x  /day with Meals for Diabetes  . Multiple Vitamin (MULTIVITAMIN) capsule Take 1 capsule by mouth daily.    [name] omeprazole (PRILOSEC) 40 MG capsule Take 1 capsule (40 mg total) by mouth daily.  . potassium chloride SA (KLOR-CON) 20 MEQ tablet Take one tablet by mouth daily with furosemide (lasix).  Take with food.  . vitamin C (ASCORBIC ACID) 500 MG tablet Take 500 mg by mouth daily.     No current facility-administered medications on file prior to visit.    ROS: all negative except above.   Physical Exam:  BP 120/60   Pulse 85   Temp (!) 97.5 F (36.4 C)   Wt 193 lb (87.5 kg)   SpO2 98%   BMI 25.12 kg/m   General Appearance: Well nourished, in no apparent distress. Eyes: PERRLA, EOMs, conjunctiva no swelling or erythema Neck: Supple, thyroid normal.  Respiratory: Respiratory effort normal, BS equal bilaterally without rales, rhonchi, wheezing or stridor.  Cardio: RRR with no MRGs. Brisk peripheral pulses +1 pitting edema bilateral, Right worse than left.  Wearing compression stockings. Musculoskeletal: Full ROM, 5/5 strength, normal gait, uses walker for stability.  Mild ataxia at times.  Skin: Warm, dry without rashes, lesions; he has fragile skin and numerous small ecchymoses to bilateral upper extremities Neuro: Cranial nerves intact. Normal muscle tone, no cerebellar symptoms. Sensation intact.  Psych: Awake and oriented X 3, normal affect, Insight and Judgment appropriate.  Garnet Sierras, NP 11:23 AM [facility]

## 2020-02-13 NOTE — Patient Instructions (Signed)
   You may re-start your glipizide 5mg  once a day in the morning.  IF you have an "woozie" sensations STOP taking.  Continue to monitor our blood sugars.  We will re-check you A1c in three months.  Continue taking the Lasix 80mg  with potassium in the morning.  We will recheck your labs today and call you with results.  We will reach out to cardiology regarding previous symptoms and treatment to see if they would like to evaluate you in their office.  Please let us know if you have any increase in swelling or shortness of breath.

## 2020-02-14 LAB — COMPLETE METABOLIC PANEL WITH GFR
AG Ratio: 2.2 (calc) (ref 1.0–2.5)
ALT: 40 U/L (ref 9–46)
AST: 21 U/L (ref 10–35)
Albumin: 4.1 g/dL (ref 3.6–5.1)
Alkaline phosphatase (APISO): 72 U/L (ref 35–144)
BUN: 22 mg/dL (ref 7–25)
CO2: 28 mmol/L (ref 20–32)
Calcium: 9.5 mg/dL (ref 8.6–10.3)
Chloride: 101 mmol/L (ref 98–110)
Creat: 1.11 mg/dL (ref 0.70–1.18)
GFR, Est African American: 73 mL/min/{1.73_m2} (ref >=60)
GFR, Est Non African American: 63 mL/min/{1.73_m2} (ref >=60)
Globulin: 1.9 g/dL (calc) (ref 1.9–3.7)
Glucose, Bld: 260 mg/dL — ABNORMAL HIGH (ref 65–99)
Potassium: 4.4 mmol/L (ref 3.5–5.3)
Sodium: 139 mmol/L (ref 135–146)
Total Bilirubin: 0.8 mg/dL (ref 0.2–1.2)
Total Protein: 6 g/dL — ABNORMAL LOW (ref 6.1–8.1)

## 2020-02-14 LAB — BRAIN NATRIURETIC PEPTIDE: Brain Natriuretic Peptide: 234 pg/mL — ABNORMAL HIGH (ref <100)

## 2020-02-17 ENCOUNTER — Ambulatory Visit: Payer: Medicare Other

## 2020-02-17 ENCOUNTER — Other Ambulatory Visit: Payer: Self-pay

## 2020-02-17 DIAGNOSIS — R1312 Dysphagia, oropharyngeal phase: Secondary | ICD-10-CM

## 2020-02-17 DIAGNOSIS — R2689 Other abnormalities of gait and mobility: Secondary | ICD-10-CM | POA: Diagnosis not present

## 2020-02-17 DIAGNOSIS — R2681 Unsteadiness on feet: Secondary | ICD-10-CM | POA: Diagnosis not present

## 2020-02-17 NOTE — Therapy (Signed)
[facility] [address] [place], [place], [address] Phone: [phone]   Fax:  [phone]  Speech Language Pathology Treatment  Patient Details  Name: Earl [name] MRN: [id number] Date of Birth: Apr 11, 1941 Referring Provider (SLP): Vicie Mutters, [place]   Encounter Date: 02/17/2020  End of Session - 02/17/20 1643    Visit Number  2    Number of Visits  17    Date for SLP Re-Evaluation  05/11/20    SLP Start Time  1320    SLP Stop Time   1400    SLP Time Calculation (min)  40 min    Activity Tolerance  Patient tolerated treatment well       Past Medical History:  Diagnosis Date  . Adenomatous colon polyp   . Allergy   . Anal fissure   . Anxiety   . Arthritis   . BPH (benign prostatic hyperplasia)   . Cataract   . Diabetes mellitus ([place])   . GERD (gastroesophageal reflux disease)   . Hyperlipidemia   . Hypertension   . Hypothyroidism   . Other testicular hypofunction   . Stroke ([place])   . Type II or unspecified type diabetes mellitus without mention of complication, not stated as uncontrolled     Past Surgical History:  Procedure Laterality Date  . CIRCUMCISION N/A 06/11/2019   Procedure: CIRCUMCISION ADULT;  Surgeon: [name], MD;  Location: [facility];  Service: Urology;  Laterality: N/A;  . COLONOSCOPY    . INGUINAL HERNIA REPAIR Bilateral    1968 and 2004  . LOOP RECORDER INSERTION N/A 07/28/2017   Procedure: LOOP RECORDER INSERTION;  Surgeon: Deboraha Sprang, MD;  Location: [facility];  Service: Cardiovascular;  Laterality: N/A;  . TEE WITHOUT CARDIOVERSION N/A 07/28/2017   Procedure: TRANSESOPHAGEAL ECHOCARDIOGRAM (TEE);  Surgeon: Sueanne Margarita, MD;  Location: [facility] ENDOSCOPY;  Service: Cardiovascular;  Laterality: N/A;    There were no vitals filed for this visit.  Subjective Assessment - 02/17/20 1426    Subjective  "I have been doing that hard swallow when I have some saliva to  swallow- is that ok?"    Currently in Pain?  No/denies            ADULT SLP TREATMENT - 02/17/20 1427      General Information   Behavior/Cognition  Alert;Cooperative;Pleasant mood;Requires cueing      Treatment Provided   Treatment provided  Dysphagia      Dysphagia Treatment   Temperature Spikes Noted  No    Respiratory Status  Room air    Treatment Methods  Skilled observation;Patient/caregiver education;Compensation strategy training;Therapeutic exercise    Patient observed directly with PO's  Yes    Type of PO's observed  Dysphagia 3 (soft);Thin liquids    Liquids provided via  Cup    Pharyngeal Phase Signs & Symptoms  Delayed throat clear    Type of cueing  Verbal;Visual    Amount of cueing  --   occasional - for following swallow safety precautions   Other treatment/comments  Pt req'd cues for chin tuck x2/9 today; ? if pt is performing this routinely at home. Pt indepenent with small bites and sips, however was ingesting sip immediately following bite- SLP educated pt to have one consistency in his mouth at a time (pt did not do this last session). Pt with question if he has to complete chin tuck with food that is soft - SLP told pt chin tuck  is with ALL POs. Pt completed HEP with usual rare min-mod A faded to rare min-mod A.       Assessment / Recommendations / Plan   Plan  Continue with current plan of care      Progression Toward Goals   Progression toward goals  Progressing toward goals       SLP Education - 02/17/20 1642    Education Details  rationale for frequency of HEP, procedure for HEP    Person(s) Educated  Patient    Methods  Explanation;Demonstration;Verbal cues;Handout    Comprehension  Verbalized understanding;Returned demonstration;Verbal cues required;Need further instruction       SLP Short Term Goals - 02/17/20 1644      SLP SHORT TERM GOAL #1   Title  pt will perform HEP with rare min A over two sessions    Time  3    Period  Weeks     Status  On-going      SLP SHORT TERM GOAL #2   Title  pt will follow aspiration precautions with rare min A over three sessions    Time  3    Period  Weeks    Status  On-going      SLP SHORT TERM GOAL #3   Title  pt will tell SLP 3 overt s/sx aspiration PNA with modified independence    Time  3    Period  Weeks    Status  On-going      SLP SHORT TERM GOAL #4   Title  pt will tell SLP rationale for completing HEP (strengthen muscles for swallowing, limit/eiliminate aspiration&penetration)    Time  3    Period  Weeks    Status  On-going       SLP Long Term Goals - 02/17/20 1644      SLP LONG TERM GOAL #1   Title  pt will follow aspiration precautions with modified independence in 4 therapy sessions    Time  8    Period  Weeks   or 17 sessions, for all LTGs   Status  On-going      SLP LONG TERM GOAL #2   Title  pt will complete HEP for dysphagia with modified independence over three sessions    Time  8    Period  Weeks    Status  On-going       Plan - 02/17/20 1643    Clinical Impression Statement  Pt presents today with mild oropharyngeal/cervical esophageal dysphagia as I'd on 12-05-19 modified (MBSS). Pt had delayed trigger (in pyriforms) with liquids and also consistent penetration, and once, SILENTaspiration with liquids mixed with secretions following the swallow due to decr'd adequacy and timing of laryngeal closure. With solids, pt triggered swallow at vallecula, with decr'd sensation of pharyngeal residues. Dys III/thin was recommended with slow rate, small bites/sips, effortful swallow, throat clear intermittently, and chin tuck with all POs. Initially SLP had to explain rationale for swallow precautions, especially, given pt's "s" statement. SLP also felt necessary to provide pt with rationale for dysphagia HEP as pt questioned why he had to complete every exercise if all the exercises were for swallowing. Pt performed each exercise in "pt instructions" independently  however began with total A from SLP. Pt will benefit from skilled ST addressing swallowing deficit due to deconditioning (S/P CVA), in order to regularly assess adherence to swallow precautions and assess accuracy of HEP.    Speech Therapy Frequency  2x / week  Duration  --   8 weeks or 17 sessions   Treatment/Interventions  Aspiration precaution training;Pharyngeal strengthening exercises;Diet toleration management by SLP;Trials of upgraded texture/liquids;Cueing hierarchy;Internal/external aids;Patient/family education;SLP instruction and feedback;Compensatory techniques    Potential to Achieve Goals  Good    Potential Considerations  Cooperation/participation level    SLP Home Exercise Plan  provided today    Consulted and Agree with Plan of Care  Patient       Patient will benefit from skilled therapeutic intervention in order to improve the following deficits and impairments:   Dysphagia, oropharyngeal phase    Problem List Patient Active Problem List   Diagnosis Date Noted  . 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 Acquired thrombophilia ([place]) 09/09/2019  . Abdominal aortic atherosclerosis ([place]) 09/09/2019  . Late effect of cerebrovascular accident (CVA) 03/28/2019  . CKD stage 2 due to type 2 diabetes mellitus ([place]) 12/20/2017  . Chronic anticoagulation 09/04/2017  . Labile blood glucose   . Hypoalbuminemia due to protein-calorie malnutrition ([place])   . Ataxia, post-stroke   . History of CVA with residual deficit   . Hypothyroidism 09/13/2016  . Medicare annual wellness visit, subsequent 08/28/2015  . Vitamin D deficiency 04/29/2014  . Medication management 01/03/2014  . Type 2 diabetes mellitus ([place])   . BPH (benign prostatic hyperplasia)   . 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 Essential hypertension 11/07/2013  . Hyperlipidemia associated with type 2 diabetes mellitus ([place]) 11/07/2013  . Paroxysmal atrial fibrillation ([place]) 09/18/2012     [place] ,[place], [place]  02/17/2020, 4:45 PM  [facility] [address] [place], [place], [address] Phone: [phone]   Fax:  [phone]   Name: Earl [name] MRN: [id number] Date of Birth: [birth year]

## 2020-02-18 ENCOUNTER — Other Ambulatory Visit: Payer: Self-pay | Admitting: Adult Health Nurse Practitioner

## 2020-02-18 NOTE — Progress Notes (Signed)
Electrophysiology Office Note Date: 02/19/2020  ID:  Earl [name], DOB Feb 14, 1941, MRN [id number]  PCP: Earl Pinto, MD Primary Cardiologist: Earl Axe, MD  CC: Pacemaker follow-up  Earl [name] is a 79 y.o. male seen today for Dr. Caryl [name] . he presents today by request of his PCP for peripheral edema. Seen by our clinic in January and edema thought non cardiac. He is feeling much better on lasix.  Denies orthopnea or DOE, and peripheral edema has much improved. He is drinking copious amounts of fluid. 4-5 18 oz bottles of water daily, with other drinks of tea etc. He denies chest pain, palpitations, PND, nausea, vomiting, dizziness, syncope, weight gain, or early satiety.  Device History: Medtronic ILR  implanted 07/2017 for cryptogenic stroke  Past Medical History:  Diagnosis Date  . Adenomatous colon polyp   . Allergy   . Anal fissure   . Anxiety   . Arthritis   . BPH (benign prostatic hyperplasia)   . Cataract   . Diabetes mellitus ([place])   . GERD (gastroesophageal reflux disease)   . Hyperlipidemia   . Hypertension   . Hypothyroidism   . Other testicular hypofunction   . Stroke ([place])   . Type II or unspecified type diabetes mellitus without mention of complication, not stated as uncontrolled    Past Surgical History:  Procedure Laterality Date  . CIRCUMCISION N/A 06/11/2019   Procedure: CIRCUMCISION ADULT;  Surgeon: [name], MD;  Location: [facility];  Service: Urology;  Laterality: N/A;  . COLONOSCOPY    . INGUINAL HERNIA REPAIR Bilateral    1968 and 2004  . LOOP RECORDER INSERTION N/A 07/28/2017   Procedure: LOOP RECORDER INSERTION;  Surgeon: Deboraha Sprang, MD;  Location: [place] [facility];  Service: Cardiovascular;  Laterality: N/A;  . TEE WITHOUT CARDIOVERSION N/A 07/28/2017   Procedure: TRANSESOPHAGEAL ECHOCARDIOGRAM (TEE);  Surgeon: Sueanne Margarita, MD;  Location: [facility] END[facility];  Service: Cardiovascular;  Laterality: N/A;    Current  Outpatient Medications  Medication Sig Dispense Refill  . ALPRAZolam (XANAX) 1 MG tablet Take 1/2-1 tablet at Bedtime  ONLY if needed for Sleep &  limit to 5 days /week to avoid Addiction & Dementia 60 tablet 0  . apixaban (ELIQUIS) 5 MG TABS tablet Take 1 tablet 2 x /day to Prevent Blood Clots 180 tablet 3  . atorvastatin (LIPITOR) 40 MG tablet Take 1 tablet at bedtime for cholestaerol. 90 tablet 3  . Blood Glucose Monitoring Suppl (ONE TOUCH ULTRA 2) w/Device KIT Check blood sugar 1 time a day-DX-E11.9 1 kit 0  . carvedilol (COREG) 3.125 MG tablet Take 1 tablet (3.125 mg total) by mouth 2 (two) times daily. 60 tablet 11  . Cholecalciferol (VITAMIN D PO) Take 1 tablet by mouth daily.     . Cyanocobalamin (B-12) 50 MCG TABS Take 50 mcg by mouth daily.    . finasteride (PROSCAR) 5 MG tablet Take 1 tablet Daily for Prostate 90 tablet 3  . gabapentin (NEURONTIN) 100 MG capsule Take 1 to 3 capsules 1 hour before Bedtime for Diabetic Neuropathy Pains 270 capsule 0  . glucose blood (ONETOUCH ULTRA) test strip Check blood sugar 1 time a day-DX-E11.9 100 each 2  . Lancets (ONETOUCH DELICA PLUS [id number]) MISC CHECK BLOOD SUGAR ONCE A DAY. 100 each 0  . Lancets Misc. (ONE TOUCH SURESOFT) MISC Check blood sugar 1 time a day-DX-E11.91 1 each 0  . levothyroxine (SYNTHROID) 50 MCG tablet Take 1 tablet daily on an empty stomach  with only water for 30 minutes & no Antacid meds, Calcium or Magnesium for 4 hours & avoid Biotin 90 tablet 3  . Magnesium 250 MG TABS Take 250 mg by mouth daily.     . metFORMIN (GLUCOPHAGE-XR) 500 MG 24 hr tablet Take 2 tablets 2 x  /day with Meals for Diabetes 360 tablet 3  . Multiple Vitamin (MULTIVITAMIN) capsule Take 1 capsule by mouth daily.      [name] omeprazole (PRILOSEC) 40 MG capsule Take 1 capsule (40 mg total) by mouth daily. 30 capsule 1  . potassium chloride SA (KLOR-CON) 20 MEQ tablet Take one tablet by mouth daily with furosemide (lasix).  Take with food. 30 tablet 1  .  triamcinolone ointment (KENALOG) 0.1 % APPLY TWICE DAILY. 15 g 0  . vitamin C (ASCORBIC ACID) 500 MG tablet Take 500 mg by mouth daily.      . furosemide (LASIX) 40 MG tablet Take 1 tablet (40 mg total) by mouth daily. 90 tablet 3   No current facility-administered medications for this visit.    Allergies:   Penicillins   Social History: Social History   Socioeconomic History  . Marital status: Widowed    Spouse name: Not on file  . Number of children: 2  . Years of education: Not on file  . Highest education level: Not on file  Occupational History  . Occupation: Herbalist: RETIRED  Tobacco Use  . Smoking status: Never Smoker  . Smokeless tobacco: Never Used  Substance and Sexual Activity  . Alcohol use: No  . Drug use: No  . Sexual activity: Not on file  Other Topics Concern  . Not on file  Social History Narrative  . Not on file   Social Determinants of Health   Financial Resource Strain:   . Difficulty of Paying Living Expenses:   Food Insecurity:   . Worried About Charity fundraiser in the Last Year:   . Arboriculturist in the Last Year:   Transportation Needs:   . Film/video editor (Medical):   [name] Lack of Transportation (Non-Medical):   Physical Activity:   . Days of Exercise per Week:   . Minutes of Exercise per Session:   Stress:   . Feeling of Stress :   Social Connections:   . Frequency of Communication with Friends and Family:   . Frequency of Social Gatherings with Friends and Family:   . Attends Religious Services:   . Active Member of Clubs or Organizations:   . Attends Archivist Meetings:   [name] Marital Status:   Intimate Partner Violence:   . Fear of Current or Ex-Partner:   . Emotionally Abused:   [name] Physically Abused:   . Sexually Abused:     Family History: Family History  Problem Relation Age of Onset  . Sickle cell anemia Mother   . Diabetes Father   . Stomach cancer Father   .  Colon cancer Father   . CAD Neg Hx   . Esophageal cancer Neg Hx   . Rectal cancer Neg Hx      Review of Systems: All other systems reviewed and are otherwise negative except as noted above.  Physical Exam: Vitals:   02/19/20 0849  BP: (!) 126/58  Pulse: 86  SpO2: 99%  Weight: 166 lb 3.2 oz (75.4 kg)  Height: _0  (1.803 m)     GEN- The patient is well appearing, alert  and oriented x 3 today.   HEENT: normocephalic, atraumatic; sclera clear, conjunctiva pink; hearing intact; oropharynx clear; neck supple  Lungs- Clear to ausculation bilaterally, normal work of breathing.  No wheezes, rales, rhonchi Heart- Regular rate and rhythm, no murmurs, rubs or gallops  GI- soft, non-tender, non-distended, bowel sounds present  Extremities- no clubbing, cyanosis, or edema  MS- no significant deformity or atrophy Skin- warm and dry, no rash or lesion; PPM pocket well healed Psych- euthymic mood, full affect Neuro- strength and sensation are intact  PPM Interrogation- reviewed in detail today,  See PACEART report  EKG:  EKG is not ordered today.  Recent Labs: 01/14/2020: Magnesium 2.0 01/16/2020: TSH 0.80 02/04/2020: Hemoglobin 12.3; Platelets 177 02/13/2020: ALT 40; Brain Natriuretic Peptide 234; BUN 22; Creat 1.11; Potassium 4.4; Sodium 139   Wt Readings from Last 3 Encounters:  02/19/20 166 lb 3.2 oz (75.4 kg)  02/13/20 193 lb (87.5 kg)  02/04/20 184 lb (83.5 kg)     Other studies Reviewed: Additional studies/ records that were reviewed today include: Echo 07/2017 shows LVEF 55-60%, Previous EP office notes, Previous remote checks, Most recent labwork.   Assessment and Plan:  1. 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 HTN Continue current medications; Known component of white coat syndrome  3. Peripheral edema/Chronic diastolic CHF Improved on lasix 80 mg daily. He thinks he has been out since Sunday and  is maintaining.  I will decrease this to 40 mg daily in the setting of his voluminous fluid intake. We discussed fluid restriction today and recommended <2000 ml fluid intake daily.  Discussed sodium restriction as well.  Could eventually consider spironolactone with data for diastolic CHF (Grade 2 diastolic dysfunction noted on previous echo)  4. Sinus tachycardia Stable.  Current medicines are reviewed at length with the patient today.   The patient does not have concerns regarding his medicines.  The following changes were made today:  Decrease lasix to 40 mg daily in setting of fluid restriction discussion.   Labs/ tests ordered today include:  No orders of the defined types were placed in this encounter.   Disposition:   Follow up with EP 12 months as recall. Continue monthly loop reports  Signed, Annamaria Helling  02/19/2020 9:20 AM  [facility] [address] [place] [address] [phone] (office) [phone] (fax)

## 2020-02-19 ENCOUNTER — Other Ambulatory Visit: Payer: Self-pay

## 2020-02-19 ENCOUNTER — Encounter: Payer: Self-pay | Admitting: Student

## 2020-02-19 ENCOUNTER — Ambulatory Visit (INDEPENDENT_AMBULATORY_CARE_PROVIDER_SITE_OTHER): Payer: Medicare Other | Admitting: Student

## 2020-02-19 VITALS — BP 126/58 | HR 86 | Ht 71.0 in | Wt 166.2 lb

## 2020-02-19 DIAGNOSIS — I480 Paroxysmal atrial fibrillation: Secondary | ICD-10-CM | POA: Diagnosis not present

## 2020-02-19 DIAGNOSIS — I5032 Chronic diastolic (congestive) heart failure: Secondary | ICD-10-CM

## 2020-02-19 DIAGNOSIS — I6930 Unspecified sequelae of cerebral infarction: Secondary | ICD-10-CM

## 2020-02-19 DIAGNOSIS — I10 Essential (primary) hypertension: Secondary | ICD-10-CM | POA: Diagnosis not present

## 2020-02-19 LAB — CUP PACEART INCLINIC DEVICE CHECK
Date Time Interrogation Session: 20210331091831
Implantable Pulse Generator Implant Date: 20180907

## 2020-02-19 MED ORDER — FUROSEMIDE 40 MG PO TABS: 40.0000 mg | ORAL_TABLET | Freq: Every day | ORAL | 3 refills | Status: DC

## 2020-02-19 NOTE — Patient Instructions (Addendum)
Medication Instructions:  DECREASE LASIX TO 40 mg DAILY *If you need a refill on your cardiac medications before your next appointment, please call your pharmacy*   Lab Work: none If you have labs (blood work) drawn today and your tests are completely normal, you will receive your results only by: [name] MyChart Message (if you have MyChart) OR . A paper copy in the mail If you have any lab test that is abnormal or we need to change your treatment, we will call you to review the results.   Testing/Procedures: none   Follow-Up: At [facility], you and your health needs are our priority.  As part of our continuing mission to provide you with exceptional heart care, we have created designated Provider Care Teams.  These Care Teams include your primary Cardiologist (physician) and Advanced Practice Providers (APPs -  Physician Assistants and Nurse Practitioners) who all work together to provide you with the care you need, when you need it.  We recommend signing up for the patient portal called "MyChart".  Sign up information is provided on this After Visit Summary.  MyChart is used to connect with patients for Virtual Visits (Telemedicine).  Patients are able to view lab/test results, encounter notes, upcoming appointments, etc.  Non-urgent messages can be sent to your provider as well.   To learn more about what you can do with MyChart, go to [email or web address].    Your next appointment:   2 MONTHS  The format for your next appointment:   In Person  Provider:   Oda Kilts, PA   Other Instructions

## 2020-02-20 ENCOUNTER — Ambulatory Visit: Payer: Medicare Other | Attending: Adult Health | Admitting: Speech Pathology

## 2020-02-20 DIAGNOSIS — R1312 Dysphagia, oropharyngeal phase: Secondary | ICD-10-CM | POA: Insufficient documentation

## 2020-02-20 NOTE — Therapy (Signed)
[facility] [address] [place], [place], [address] Phone: [phone]   Fax:  [phone]  Speech Language Pathology Treatment  Patient Details  Name: Earl [name] MRN: [id number] Date of Birth: [birth year] Referring Provider (SLP): Vicie Mutters, [place]   Encounter Date: 02/20/2020  End of Session - 02/20/20 1603    Visit Number  3    Number of Visits  17    Date for SLP Re-Evaluation  05/11/20    SLP Start Time  1103    SLP Stop Time   1145    SLP Time Calculation (min)  42 min    Activity Tolerance  Patient tolerated treatment well       Past Medical History:  Diagnosis Date  . Adenomatous colon polyp   . Allergy   . Anal fissure   . Anxiety   . Arthritis   . BPH (benign prostatic hyperplasia)   . Cataract   . Diabetes mellitus ([place])   . GERD (gastroesophageal reflux disease)   . Hyperlipidemia   . Hypertension   . Hypothyroidism   . Other testicular hypofunction   . Stroke ([place])   . Type II or unspecified type diabetes mellitus without mention of complication, not stated as uncontrolled     Past Surgical History:  Procedure Laterality Date  . CIRCUMCISION N/A 06/11/2019   Procedure: CIRCUMCISION ADULT;  Surgeon: [name], MD;  [facility]: [facility];  Service: Urology;  Laterality: N/A;  . COLONOSCOPY    . INGUINAL HERNIA REPAIR Bilateral    1968 and 2004  . LOOP RECORDER INSERTION N/A 07/28/2017   Procedure: LOOP RECORDER INSERTION;  Surgeon: Deboraha Sprang, MD;  Location: [place] [facility];  Service: Cardiovascular;  Laterality: N/A;  . TEE WITHOUT CARDIOVERSION N/A 07/28/2017   Procedure: TRANSESOPHAGEAL ECHOCARDIOGRAM (TEE);  Surgeon: Sueanne Margarita, MD;  Location: [facility] ENDOSCOPY;  Service: Cardiovascular;  Laterality: N/A;    There were no vitals filed for this visit.  Subjective Assessment - 02/20/20 1105    Subjective  "The first three I can do. But I can't do the last one."             ADULT SLP TREATMENT - 02/20/20 1556      General Information   Behavior/Cognition  Alert;Cooperative;Pleasant mood;Requires cueing      Treatment Provided   Treatment provided  Dysphagia      Dysphagia Treatment   Temperature Spikes Noted  No    Respiratory Status  Room air    Treatment Methods  Skilled observation;Patient/caregiver education;Compensation strategy training;Therapeutic exercise    Patient observed directly with PO's  Yes    Type of PO's observed  Thin liquids    Feeding  Able to feed self    Liquids provided via  Cup    Pharyngeal Phase Signs & Symptoms  --   none noted   Type of cueing  Verbal;Visual    Amount of cueing  --   occasional for swallow strategies/precautions   Other treatment/comments  Pt did not tuck chin when sipping water between exercises on first 3 sips. SLP provided verbal cues for subsquent sips, and then made table tent for pt. He required gesture cue to table tent to use chin tuck throughout session. SLP reviewed HEP with pt. Required rare min A for effortful swallow and [place]; usual min-mod A with breath hold and open mouth swallow, fading to rare min-mod A.      Pain Assessment  Pain Assessment  No/denies pain      Progression Toward Goals   Progression toward goals  Progressing toward goals       SLP Education - 02/20/20 1606    Education Details  rationale for HEP/procedure for HEP       SLP Short Term Goals - 02/20/20 1607      SLP SHORT TERM GOAL #1   Title  pt will perform HEP with rare min A over two sessions    Time  3    Period  Weeks    Status  On-going      SLP SHORT TERM GOAL #2   Title  pt will follow aspiration precautions with rare min A over three sessions    Time  3    Period  Weeks    Status  On-going      SLP SHORT TERM GOAL #3   Title  pt will tell SLP 3 overt s/sx aspiration PNA with modified independence    Time  3    Period  Weeks    Status  On-going      SLP SHORT TERM GOAL #4    Title  pt will tell SLP rationale for completing HEP (strengthen muscles for swallowing, limit/eiliminate aspiration&penetration)    Time  3    Period  Weeks    Status  On-going       SLP Long Term Goals - 02/20/20 1607      SLP LONG TERM GOAL #1   Title  pt will follow aspiration precautions with modified independence in 4 therapy sessions    Time  8    Period  Weeks   or 17 sessions, for all LTGs   Status  On-going      SLP LONG TERM GOAL #2   Title  pt will complete HEP for dysphagia with modified independence over three sessions    Time  8    Period  Weeks    Status  On-going       Plan - 02/20/20 1603    Clinical Impression Statement  Pt presents today with mild oropharyngeal/cervical esophageal dysphagia as I'd on 12-05-19 modified (MBSS). Pt had delayed trigger (in pyriforms) with liquids and also consistent penetration, and once, SILENT aspiration with liquids mixed with secretions following the swallow due to decr'd adequacy and timing of laryngeal closure. With solids, pt triggered swallow at vallecula, with decr'd sensation of pharyngeal residues. Dys III/thin was recommended with slow rate, small bites/sips, effortful swallow, throat clear intermittently, and chin tuck with all POs. Pt reports following precautions at home however he required cues throughout session for chin tuck. Pt continues to require education/explanation of rationale for exercises and precautions. Pt will benefit from skilled ST addressing swallowing deficit due to deconditioning (S/P CVA), in order to regularly assess adherence to swallow precautions and assess accuracy of HEP.    Speech Therapy Frequency  2x / week    Duration  --   8 weeks or 17 sessions   Treatment/Interventions  Aspiration precaution training;Pharyngeal strengthening exercises;Diet toleration management by SLP;Trials of upgraded texture/liquids;Cueing hierarchy;Internal/external aids;Patient/family education;SLP instruction and  feedback;Compensatory techniques    Potential to Achieve Goals  Good    Potential Considerations  Cooperation/participation level    SLP Home Exercise Plan  provided today    Consulted and Agree with Plan of Care  Patient       Patient will benefit from skilled therapeutic intervention in order to improve the following deficits  and impairments:   Dysphagia, oropharyngeal phase    Problem List Patient Active Problem List   Diagnosis Date Noted  . 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 Acquired thrombophilia ([place]) 09/09/2019  . Abdominal aortic atherosclerosis ([place]) 09/09/2019  . Late effect of cerebrovascular accident (CVA) 03/28/2019  . CKD stage 2 due to type 2 diabetes mellitus ([place]) 12/20/2017  . Chronic anticoagulation 09/04/2017  . Labile blood glucose   . Hypoalbuminemia due to protein-calorie malnutrition ([place])   . Ataxia, post-stroke   . History of CVA with residual deficit   . Hypothyroidism 09/13/2016  . Medicare annual wellness visit, subsequent 08/28/2015  . Vitamin D deficiency 04/29/2014  . Medication management 01/03/2014  . Type 2 diabetes mellitus ([place])   . BPH (benign prostatic hyperplasia)   . 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 Essential hypertension 11/07/2013  . Hyperlipidemia associated with type 2 diabetes mellitus ([place]) 11/07/2013  . Paroxysmal atrial fibrillation ([place]) 09/18/2012   Deneise Lever, [place], Elias-Fela Solis 02/20/2020, 4:08 PM  [facility] [address] [place], [place], [address] Phone: [phone]   Fax:  [phone]   Name: Dilpreet Modesto MRN: [id number] Date of Birth: 07-17-1941

## 2020-02-24 ENCOUNTER — Ambulatory Visit (INDEPENDENT_AMBULATORY_CARE_PROVIDER_SITE_OTHER): Payer: Medicare Other | Admitting: *Deleted

## 2020-02-24 DIAGNOSIS — I6930 Unspecified sequelae of cerebral infarction: Secondary | ICD-10-CM

## 2020-02-24 LAB — CUP PACEART REMOTE DEVICE CHECK
Date Time Interrogation Session: 20210404041220
Implantable Pulse Generator Implant Date: 20180907

## 2020-02-24 NOTE — Progress Notes (Signed)
I agree with the above plan 

## 2020-02-25 ENCOUNTER — Other Ambulatory Visit: Payer: Self-pay

## 2020-02-25 ENCOUNTER — Ambulatory Visit: Payer: Medicare Other

## 2020-02-25 DIAGNOSIS — R1312 Dysphagia, oropharyngeal phase: Secondary | ICD-10-CM

## 2020-02-25 NOTE — Progress Notes (Signed)
ILR Remote 

## 2020-02-25 NOTE — Therapy (Signed)
[facility] [address] [place], [place], [address] Phone: [phone]   Fax:  [phone]  Speech Language Pathology Treatment  Patient Details  Name: Earl [name] MRN: [id number] Date of Birth: July 21, 1941 Referring Provider (SLP): Vicie Mutters, [place]   Encounter Date: 02/25/2020  End of Session - 02/25/20 0944    Visit Number  4    Number of Visits  17    Date for SLP Re-Evaluation  05/11/20    SLP Start Time  0803    SLP Stop Time   0838    SLP Time Calculation (min)  35 min    Activity Tolerance  --       Past Medical History:  Diagnosis Date  . Adenomatous colon polyp   . Allergy   . Anal fissure   . Anxiety   . Arthritis   . BPH (benign prostatic hyperplasia)   . Cataract   . Diabetes mellitus ([place])   . GERD (gastroesophageal reflux disease)   . Hyperlipidemia   . Hypertension   . Hypothyroidism   . Other testicular hypofunction   . Stroke ([place])   . Type II or unspecified type diabetes mellitus without mention of complication, not stated as uncontrolled     Past Surgical History:  Procedure Laterality Date  . CIRCUMCISION N/A 06/11/2019   Procedure: CIRCUMCISION ADULT;  Surgeon: [name], MD;  Location: [facility];  Service: Urology;  Laterality: N/A;  . COLONOSCOPY    . INGUINAL HERNIA REPAIR Bilateral    1968 and 2004  . LOOP RECORDER INSERTION N/A 07/28/2017   Procedure: LOOP RECORDER INSERTION;  Surgeon: Deboraha Sprang, MD;  Location: [facility];  Service: Cardiovascular;  Laterality: N/A;  . TEE WITHOUT CARDIOVERSION N/A 07/28/2017   Procedure: TRANSESOPHAGEAL ECHOCARDIOGRAM (TEE);  Surgeon: Sueanne Margarita, MD;  Location: [facility];  Service: Cardiovascular;  Laterality: N/A;    There were no vitals filed for this visit.         ADULT SLP TREATMENT - 02/25/20 0820      General Information   Behavior/Cognition  Alert;Cooperative;Pleasant mood;Requires cueing       Treatment Provided   Treatment provided  Dysphagia      Dysphagia Treatment   Temperature Spikes Noted  No    Respiratory Status  Room air    Treatment Methods  Skilled observation;Compensation strategy training    Patient observed directly with PO's  Yes    Type of PO's observed  Thin liquids    Feeding  Able to feed self    Liquids provided via  Cup    Pharyngeal Phase Signs & Symptoms  --   none observed   Type of cueing  Verbal;Visual    Amount of cueing  Minimal   occasional faded to independent   Other treatment/comments  Chin tuck again NOT used with PO water today - when asked if pt was supposed to use chin tuck hestated, "with the water?" SLP confirmed chin tuck with all POs, solids and liquids and had pt practice chin tuck with x8 swallows thin. Pt used chin tuck for remainder of session. With HEP, pt req'd cues for tongue out with [place] -  independent by session end. He req'd min A initially for open mouth swallow - can clench teeth to perform now but SLP told pt if cont to perform regularly he should be able to eventually perfrom without teeth clenching. Pt independent with HEP by session end.  Pain Assessment   Pain Assessment  0-10    Pain Score  3     Pain Location  rt leg     Pain Descriptors / Indicators  Sore   "I think I over-exercised it"     Assessment / Recommendations / Plan   Plan  Continue with current plan of care      Progression Toward Goals   Progression toward goals  Progressing toward goals       SLP Education - 02/25/20 0943    Education Details  Details of HEP, need chin tuck with all POs    Person(s) Educated  Patient    Methods  Explanation;Demonstration;Verbal cues    Comprehension  Verbalized understanding;Returned demonstration;Verbal cues required;Need further instruction       SLP Short Term Goals - 02/25/20 0947      SLP SHORT TERM GOAL #1   Title  pt will perform HEP with rare min A over two sessions    Time  2     Period  Weeks    Status  On-going      SLP SHORT TERM GOAL #2   Title  pt will follow aspiration precautions with rare min A over three sessions    Baseline  --    Time  2    Period  Weeks    Status  On-going      SLP SHORT TERM GOAL #3   Title  pt will tell SLP 3 overt s/sx aspiration PNA with modified independence    Time  2    Period  Weeks    Status  On-going      SLP SHORT TERM GOAL #4   Title  pt will tell SLP rationale for completing HEP (strengthen muscles for swallowing, limit/eiliminate aspiration&penetration)    Time  2    Period  Weeks    Status  On-going       SLP Long Term Goals - 02/25/20 0948      SLP LONG TERM GOAL #1   Title  pt will follow aspiration precautions with modified independence in 4 therapy sessions    Time  7    Period  Weeks   or 17 sessions, for all LTGs   Status  On-going      SLP LONG TERM GOAL #2   Title  pt will complete HEP for dysphagia with modified independence over three sessions    Time  7    Period  Weeks    Status  On-going       Plan - 02/25/20 0946    Clinical Impression Statement  Pt presents today with mild oropharyngeal/cervical esophageal dysphagia as I'd on 12-05-19 modified (MBSS). Pt had delayed trigger (in pyriforms) with liquids and also consistent penetration, and once, SILENT aspiration with liquids mixed with secretions following the swallow due to decr'd adequacy and timing of laryngeal closure. With solids, pt triggered swallow at vallecula, with decr'd sensation of pharyngeal residues. Dys III/thin was recommended with slow rate, small bites/sips, effortful swallow, throat clear intermittently, and chin tuck with all POs. Pt will benefit from skilled ST addressing swallowing deficit due to deconditioning (S/P CVA), in order to regularly assess adherence to swallow precautions and assess accuracy of HEP.    Speech Therapy Frequency  2x / week    Duration  --   8 weeks or 17 sessions   Treatment/Interventions   Aspiration precaution training;Pharyngeal strengthening exercises;Diet toleration management by SLP;Trials of upgraded texture/liquids;Cueing hierarchy;Internal/external  aids;Patient/family education;SLP instruction and feedback;Compensatory techniques    Potential to Achieve Goals  Good    Potential Considerations  Cooperation/participation level    SLP Home Exercise Plan  provided today    Consulted and Agree with Plan of Care  Patient       Patient will benefit from skilled therapeutic intervention in order to improve the following deficits and impairments:   Dysphagia, oropharyngeal phase    Problem List Patient Active Problem List   Diagnosis Date Noted  . 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 Acquired thrombophilia ([place]) 09/09/2019  . Abdominal aortic atherosclerosis ([place]) 09/09/2019  . Late effect of cerebrovascular accident (CVA) 03/28/2019  . CKD stage 2 due to type 2 diabetes mellitus ([place]) 12/20/2017  . Chronic anticoagulation 09/04/2017  . Labile blood glucose   . Hypoalbuminemia due to protein-calorie malnutrition ([place])   . Ataxia, post-stroke   . History of CVA with residual deficit   . Hypothyroidism 09/13/2016  . Medicare annual wellness visit, subsequent 08/28/2015  . Vitamin D deficiency 04/29/2014  . Medication management 01/03/2014  . Type 2 diabetes mellitus ([place])   . BPH (benign prostatic hyperplasia)   . 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 Essential hypertension 11/07/2013  . Hyperlipidemia associated with type 2 diabetes mellitus ([place]) 11/07/2013  . Paroxysmal atrial fibrillation ([place]) 09/18/2012    Waldo ,[place], [place]  02/25/2020, 9:49 AM  [facility] [address] [place], [place], [address] Phone: [phone]   Fax:  [phone]   Name: Earl [name] MRN: [id number] Date of Birth: 08-20-1941

## 2020-03-02 ENCOUNTER — Ambulatory Visit: Payer: Medicare Other

## 2020-03-02 ENCOUNTER — Other Ambulatory Visit: Payer: Self-pay

## 2020-03-02 DIAGNOSIS — R1312 Dysphagia, oropharyngeal phase: Secondary | ICD-10-CM

## 2020-03-02 NOTE — Therapy (Signed)
[facility] [address] [place], [place], [address] Phone: [phone]   Fax:  [phone]  Speech Language Pathology Treatment  Patient Details  Name: Earl [name] MRN: [id number] Date of Birth: April 30, 1941 Referring Provider (SLP): Vicie Mutters, [place]   Encounter Date: 03/02/2020  End of Session - 03/02/20 1355    Visit Number  5    Number of Visits  17    Date for SLP Re-Evaluation  05/11/20    SLP Start Time  1316    SLP Stop Time   1339    SLP Time Calculation (min)  23 min    Activity Tolerance  Patient tolerated treatment well       Past Medical History:  Diagnosis Date  . Adenomatous colon polyp   . Allergy   . Anal fissure   . Anxiety   . Arthritis   . BPH (benign prostatic hyperplasia)   . Cataract   . Diabetes mellitus ([place])   . GERD (gastroesophageal reflux disease)   . Hyperlipidemia   . Hypertension   . Hypothyroidism   . Other testicular hypofunction   . Stroke ([place])   . Type II or unspecified type diabetes mellitus without mention of complication, not stated as uncontrolled     Past Surgical History:  Procedure Laterality Date  . CIRCUMCISION N/A 06/11/2019   Procedure: CIRCUMCISION ADULT;  Surgeon: [name], MD;  Location: [facility];  Service: Urology;  Laterality: N/A;  . COLONOSCOPY    . INGUINAL HERNIA REPAIR Bilateral    1968 and 2004  . LOOP RECORDER INSERTION N/A 07/28/2017   Procedure: LOOP RECORDER INSERTION;  Surgeon: Deboraha Sprang, MD;  Location: [facility];  Service: Cardiovascular;  Laterality: N/A;  . TEE WITHOUT CARDIOVERSION N/A 07/28/2017   Procedure: TRANSESOPHAGEAL ECHOCARDIOGRAM (TEE);  Surgeon: Sueanne Margarita, MD;  Location: [facility] END[facility];  Service: Cardiovascular;  Laterality: N/A;    There were no vitals filed for this visit.  Subjective Assessment - 03/02/20 1322    Subjective  "I'm done with my physical therapy I wanna know when I'll be  done with this (ST)." SLP told pt pt's progress today will help determine this.    Currently in Pain?  No/denies            ADULT SLP TREATMENT - 03/02/20 1328      General Information   Behavior/Cognition  Alert;Cooperative;Pleasant mood      Treatment Provided   Treatment provided  Dysphagia      Dysphagia Treatment   Temperature Spikes Noted  No    Respiratory Status  Room air    Treatment Methods  Skilled observation;Compensation strategy training    Patient observed directly with PO's  Yes    Type of PO's observed  Thin liquids;Dysphagia 3 (soft)    Feeding  Able to feed self    Liquids provided via  Cup    Oral Phase Signs & Symptoms  Prolonged mastication    Pharyngeal Phase Signs & Symptoms  --   none noted today   Type of cueing  Verbal    Amount of cueing  --   initially, re: do not bite tongue wiht open mouth swallow   Other treatment/comments  SLP cued pt to put chin down with liquid sips. SLP told pt if he is taking 1/2 tsp for his HEP no chin tuck necessary. Pt was independnet with chin tuck today with liquids and solids. Pt req'd initial min  A re: do not bite tongue with open mouth swallow. Pt independent with this on next rep of open mouth swallow.        Assessment / Recommendations / Plan   Plan  --   decr to once/week given pt's "s", and progress     Dysphagia Recommendations   Diet recommendations  Dysphagia 3 (mechanical soft);Thin liquid    Liquids provided via  Cup    Medication Administration  Whole meds with puree   or with chin tuck   Supervision  Patient able to self feed    Compensations  Slow rate;Small sips/bites;Clear throat intermittently    Postural Changes and/or Swallow Maneuvers  Chin tuck      Progression Toward Goals   Progression toward goals  Progressing toward goals         SLP Short Term Goals - 03/02/20 1356      SLP SHORT TERM GOAL #1   Title  pt will perform HEP with rare min A over two sessions    Baseline   03-02-20    Time  1    Period  Weeks    Status  On-going      SLP SHORT TERM GOAL #2   Title  pt will follow aspiration precautions with rare min A over three sessions    Baseline  03-02-20    Time  1    Period  Weeks    Status  On-going      SLP SHORT TERM GOAL #3   Title  pt will tell SLP 3 overt s/sx aspiration PNA with modified independence    Time  1    Period  Weeks    Status  Deferred      SLP SHORT TERM GOAL #4   Title  pt will tell SLP rationale for completing HEP (strengthen muscles for swallowing, limit/eiliminate aspiration&penetration)    Time  1    Period  Weeks    Status  On-going       SLP Long Term Goals - 03/02/20 1358      SLP LONG TERM GOAL #1   Title  pt will follow aspiration precautions with modified independence in 4 therapy sessions    Time  6    Period  Weeks   or 17 sessions, for all LTGs   Status  On-going      SLP LONG TERM GOAL #2   Title  pt will complete HEP for dysphagia with modified independence over three sessions    Time  6    Period  Weeks    Status  On-going      SLP LONG TERM GOAL #3   Title  pt will tell SLP 3 overt s/s aspiration PNA with modified independence    Time  6    Period  Weeks    Status  New         Patient will benefit from skilled therapeutic intervention in order to improve the following deficits and impairments:   Dysphagia, oropharyngeal phase    Problem List Patient Active Problem List   Diagnosis Date Noted  . 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 Acquired thrombophilia ([place]) 09/09/2019  . Abdominal aortic atherosclerosis ([place]) 09/09/2019  . Late effect of cerebrovascular accident (CVA) 03/28/2019  . CKD stage 2 due to type 2 diabetes mellitus ([place]) 12/20/2017  . Chronic anticoagulation 09/04/2017  . Labile blood glucose   . Hypoalbuminemia due to protein-calorie malnutrition ([place])   . Ataxia, post-stroke   .  History of CVA with residual deficit   . Hypothyroidism 09/13/2016  . Medicare  annual wellness visit, subsequent 08/28/2015  . Vitamin D deficiency 04/29/2014  . Medication management 01/03/2014  . Type 2 diabetes mellitus ([place])   . BPH (benign prostatic hyperplasia)   . 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 Essential hypertension 11/07/2013  . Hyperlipidemia associated with type 2 diabetes mellitus ([place]) 11/07/2013  . Paroxysmal atrial fibrillation ([place]) 09/18/2012    Kaeleigh Westendorf ,MS, [place]  03/02/2020, 1:59 PM  [facility] [address] [place], [place], [address] Phone: [phone]   Fax:  [phone]   Name: Romin Freese MRN: [id number] Date of Birth: 09/21/1941

## 2020-03-04 ENCOUNTER — Ambulatory Visit: Payer: Medicare Other

## 2020-03-09 ENCOUNTER — Encounter: Payer: Self-pay | Admitting: Physician Assistant

## 2020-03-09 ENCOUNTER — Other Ambulatory Visit: Payer: Self-pay

## 2020-03-09 ENCOUNTER — Ambulatory Visit (INDEPENDENT_AMBULATORY_CARE_PROVIDER_SITE_OTHER): Payer: Medicare Other | Admitting: Physician Assistant

## 2020-03-09 ENCOUNTER — Ambulatory Visit: Payer: Medicare Other

## 2020-03-09 VITALS — BP 122/70 | HR 100 | Temp 97.7°F | Wt 168.8 lb

## 2020-03-09 DIAGNOSIS — R040 Epistaxis: Secondary | ICD-10-CM

## 2020-03-09 DIAGNOSIS — R7989 Other specified abnormal findings of blood chemistry: Secondary | ICD-10-CM

## 2020-03-09 DIAGNOSIS — R1312 Dysphagia, oropharyngeal phase: Secondary | ICD-10-CM | POA: Diagnosis not present

## 2020-03-09 DIAGNOSIS — D6869 Other thrombophilia: Secondary | ICD-10-CM | POA: Diagnosis not present

## 2020-03-09 MED ORDER — SALINE NASAL SPRAY 0.65 % NA SOLN: 2.0000 | NASAL | 3 refills | Status: DC | PRN

## 2020-03-09 NOTE — Patient Instructions (Signed)
Do saline nasal spray 2-3 x a day Will check labs Will refer to ENT for them to take a looks since you are on the eliquis if you have a bleeding blood vessel that is superficial I want them to take care of it so it will not happen again.   If it happens again lean forward, hold pressure constant x 20 mins, no looking and if it is not better after 1-2 hours go to the ER with the medications you are on  Nosebleed, Adult A nosebleed is when blood comes out of the nose. Nosebleeds are common. Usually, they are not a sign of a serious condition. Nosebleeds can happen if a small blood vessel in your nose starts to bleed or if the lining of your nose (mucous membrane) cracks. They are commonly caused by:  Allergies.  Colds.  Picking your nose.  Blowing your nose too hard.  An injury from sticking an object into your nose or getting hit in the nose.  Dry or cold air. Less common causes of nosebleeds include:  Toxic fumes.  Something abnormal in the nose or in the air-filled spaces in the bones of the face (sinuses).  Growths in the nose, such as polyps.  Medicines or conditions that cause blood to clot slowly.  Certain illnesses or procedures that irritate or dry out the nasal passages. Follow these instructions at home: When you have a nosebleed:   Sit down and tilt your head slightly forward.  Use a clean towel or tissue to pinch your nostrils under the bony part of your nose. After 10 minutes, let go of your nose and see if bleeding starts again. Do not release pressure before that time. If there is still bleeding, repeat the pinching and holding for 10 minutes until the bleeding stops.  Do not place tissues or gauze in the nose to stop bleeding.  Avoid lying down and avoid tilting your head backward. That may make blood collect in the throat and cause gagging or coughing.  Use a nasal spray decongestant to help with a nosebleed as told by your health care provider.  Do not  use petroleum jelly or mineral oil in your nose. It can drip into your lungs. After a nosebleed:  Avoid blowing your nose or sniffing for a number of hours.  Avoid straining, lifting, or bending at the waist for several days. You may resume other normal activities as you are able.  Use saline spray or a humidifier as told by your health care provider.  Aspirinand blood thinners make bleeding more likely. If you are prescribed these medicines and you suffer from nosebleeds: ? Ask your health care provider if you should stop taking the medicines or if you should adjust the dose. ? Do not stop taking medicines that your health care provider has recommended unless told by your health care provider.  If your nosebleed was caused by dry mucous membranes, use over-the-counter saline nasal spray or gel. This will keep the mucous membranes moist and allow them to heal. If you must use a lubricant: ? Choose one that is water-soluble. ? Use only as much as you need and use it only as often as needed. ? Do not lie down until several hours after you use it. Contact a health care provider if:  You have a fever.  You get nosebleeds often or more often than usual.  You bruise very easily.  You have a nosebleed from having something stuck in your nose.  You have bleeding in your mouth.  You vomit or cough up brown material.  You have a nosebleed after you start a new medicine. Get help right away if:  You have a nosebleed after a fall or a head injury.  Your nosebleed does not go away after 20 minutes.  You feel dizzy or weak.  You have unusual bleeding from other parts of your body.  You have unusual bruising on other parts of your body.  You become sweaty.  You vomit blood. This information is not intended to replace advice given to you by your health care provider. Make sure you discuss any questions you have with your health care provider. Document Revised: 02/06/2018 Document  Reviewed: 05/24/2016 Elsevier Patient Education  [place].

## 2020-03-09 NOTE — Therapy (Signed)
[facility] [address] [place], [place], [address] Phone: [phone]   Fax:  [phone]  Speech Language Pathology Treatment  Patient Details  Name: Earl [name] MRN: [id number] Date of Birth: 1941/05/06 Referring Provider (SLP): Vicie Mutters, [place]   Encounter Date: 03/09/2020  End of Session - 03/09/20 1718    Visit Number  6    Number of Visits  17    Date for SLP Re-Evaluation  05/11/20    SLP Start Time  5597    SLP Stop Time   1345    SLP Time Calculation (min)  27 min    Activity Tolerance  Patient tolerated treatment well       Past Medical History:  Diagnosis Date  . Adenomatous colon polyp   . Allergy   . Anal fissure   . Anxiety   . Arthritis   . BPH (benign prostatic hyperplasia)   . Cataract   . Diabetes mellitus ([place])   . GERD (gastroesophageal reflux disease)   . Hyperlipidemia   . Hypertension   . Hypothyroidism   . Other testicular hypofunction   . Stroke ([place])   . Type II or unspecified type diabetes mellitus without mention of complication, not stated as uncontrolled     Past Surgical History:  Procedure Laterality Date  . CIRCUMCISION N/A 06/11/2019   Procedure: CIRCUMCISION ADULT;  Surgeon: [name], MD;  Location: [facility];  Service: Urology;  Laterality: N/A;  . COLONOSCOPY    . INGUINAL HERNIA REPAIR Bilateral    1968 and 2004  . LOOP RECORDER INSERTION N/A 07/28/2017   Procedure: LOOP RECORDER INSERTION;  Surgeon: Deboraha Sprang, MD;  Location: [place] [facility];  Service: Cardiovascular;  Laterality: N/A;  . TEE WITHOUT CARDIOVERSION N/A 07/28/2017   Procedure: TRANSESOPHAGEAL ECHOCARDIOGRAM (TEE);  Surgeon: Sueanne Margarita, MD;  Location: [facility] ENDOSCOPY;  Service: Cardiovascular;  Laterality: N/A;    There were no vitals filed for this visit.  Subjective Assessment - 03/09/20 1324    Subjective  "I chew longer now - it seems better to me."    Currently in  Pain?  No/denies            ADULT SLP TREATMENT - 03/09/20 1325      Treatment Provided   Treatment provided  Dysphagia      Dysphagia Treatment   Temperature Spikes Noted  No    Respiratory Status  Room air    Treatment Methods  Skilled observation;Compensation strategy training    Patient observed directly with PO's  Yes    Type of PO's observed  Thin liquids;Dysphagia 3 (soft)    Feeding  Able to feed self    Liquids provided via  Cup    Oral Phase Signs & Symptoms  Prolonged mastication    Pharyngeal Phase Signs & Symptoms  --   none noted   Other treatment/comments  Pt told SLP rationale for HEP with initial modified independence; later with independence. Pt with chin down and other precautionswithout cues. Pt asked what he needed to do intermittently during meals and pt performed throat clear and reswallow. Pt stated he voluntaraily clears throat about 5-8 times each meal - SLP told pt he should aim for 10 times/meal. Pt acknowledged this. Pt told SLP he has been performingHEP for approx 4 weeks so SLP suggested modifed barium swallow exam (MBSS) in approx 4-5 weeks. SLP to contact PCP. HEP was completed WNL today.  Assessment / Recommendations / Plan   Plan  --   suggest MBSS to PCP; further plan TBD following MBSS     Dysphagia Recommendations   Diet recommendations  Dysphagia 3 (mechanical soft);Thin liquid    Liquids provided via  Cup    Medication Administration  Whole meds with puree    Supervision  Patient able to self feed    Compensations  Slow rate;Small sips/bites;Clear throat intermittently    Postural Changes and/or Swallow Maneuvers  Chin tuck      Progression Toward Goals   Progression toward goals  Progressing toward goals         SLP Short Term Goals - 03/09/20 1721      SLP SHORT TERM GOAL #1   Title  pt will perform HEP with rare min A over two sessions    Status  Achieved      SLP SHORT TERM GOAL #2   Title  pt will follow aspiration  precautions with rare min A over three sessions    Baseline  03-02-20, 03-09-20    Status  Partially Met      SLP SHORT TERM GOAL #3   Title  pt will tell SLP 3 overt s/sx aspiration PNA with modified independence    Time  1    Period  Weeks    Status  Deferred      SLP SHORT TERM GOAL #4   Title  pt will tell SLP rationale for completing HEP (strengthen muscles for swallowing, limit/eiliminate aspiration&penetration)    Status  Achieved       SLP Long Term Goals - 03/09/20 1722      SLP LONG TERM GOAL #1   Title  pt will follow aspiration precautions with modified independence in 4 therapy sessions    Baseline  03-09-20    Time  5    Period  Weeks   or 17 sessions, for all LTGs   Status  On-going      SLP LONG TERM GOAL #2   Title  pt will complete HEP for dysphagia with modified independence over three sessions    Baseline  03-09-20    Time  5    Period  Weeks    Status  On-going      SLP LONG TERM GOAL #3   Title  pt will tell SLP 3 overt s/s aspiration PNA with modified independence    Time  5    Period  Weeks    Status  On-going       Plan - 03/09/20 1719    Clinical Impression Statement  Pt presents today with mild oropharyngeal/cervical esophageal dysphagia as I'd on 12-05-19 modified (MBSS). Pt had delayed trigger (in pyriforms) with liquids and also consistent penetration, and once, SILENT aspiration with liquids mixed with secretions following the swallow due to decr'd adequacy and timing of laryngeal closure. With solids, pt triggered swallow at vallecula, with decr'd sensation of pharyngeal residues. Dys III/thin was recommended with slow rate, small bites/sips, effortful swallow, throat clear intermittently, and chin tuck with all POs. Pt performing HEP without cues, and precautions with modified independence. Pt's PCP will be contacted today suggesting follow up MBSS in approx 4 weeks. Pt will schedule one ST following his MBSS and plan will be determined after that  exam. Pt will benefit from skilled ST addressing swallowing deficit due to deconditioning (S/P CVA), in order to regularly assess adherence to swallow precautions and assess accuracy of HEP.  Speech Therapy Frequency  --   one visit   Duration  --   after next MBSS   Treatment/Interventions  Aspiration precaution training;Pharyngeal strengthening exercises;Diet toleration management by SLP;Trials of upgraded texture/liquids;Cueing hierarchy;Internal/external aids;Patient/family education;SLP instruction and feedback;Compensatory techniques    Potential to Achieve Goals  Good    Potential Considerations  Cooperation/participation level    SLP Home Exercise Plan  provided today    Consulted and Agree with Plan of Care  Patient       Patient will benefit from skilled therapeutic intervention in order to improve the following deficits and impairments:   Dysphagia, oropharyngeal phase    Problem List Patient Active Problem List   Diagnosis Date Noted  . 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 Acquired thrombophilia ([place]) 09/09/2019  . Abdominal aortic atherosclerosis ([place]) 09/09/2019  . Late effect of cerebrovascular accident (CVA) 03/28/2019  . CKD stage 2 due to type 2 diabetes mellitus ([place]) 12/20/2017  . Chronic anticoagulation 09/04/2017  . Labile blood glucose   . Hypoalbuminemia due to protein-calorie malnutrition ([place])   . Ataxia, post-stroke   . History of CVA with residual deficit   . Hypothyroidism 09/13/2016  . Medicare annual wellness visit, subsequent 08/28/2015  . Vitamin D deficiency 04/29/2014  . Medication management 01/03/2014  . Type 2 diabetes mellitus ([place])   . BPH (benign prostatic hyperplasia)   . 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 Essential hypertension 11/07/2013  . Hyperlipidemia associated with type 2 diabetes mellitus ([place]) 11/07/2013  . Paroxysmal atrial fibrillation ([place]) 09/18/2012    [place] ,[place],  [place]  03/09/2020, 5:23 PM  [facility] [address] [place], [place], [address] Phone: [phone]   Fax:  [phone]   Name: Erubiel Manasco MRN: [id number] Date of Birth: 1941/08/05

## 2020-03-09 NOTE — Progress Notes (Signed)
Subjective:    Patient ID: Earl [name], male    DOB: 17-Apr-1941, 79 y.o.   MRN: [id number]  HPI 79 y.o. AAM with history of pAFib, MGUS, protein calorie malnutrition, CVA with ataxia, DM2 with chol, CKD, hypothyroidism presents with nose bleeds, patient is on eliquis 5 mg BID.   Patient is very tearful and upset about his wife's passing, [name], have been married 72 years. His daughter is staying with him at night.   Patient has never had a nose bleed, he started to bleed from his left nostril. Woke up this AM, with bleeding out of his left nostril, bright red blood, bleed for 4 hours. Having some sinus drainage, no fever, chills.  Last INR was 01/17/2020 and normal.  HIV negative  Hep panel negative Follows with Dr. Alen Blew 1 x a year  He has been put on lasix for swelling bilateral leg, went from 80 to 40 mg recently from cardiologist, Dr. Caryl Comes. Swelling is better. He has MGUS, low protein and elevate LFTs  He is having swallow study and following with neuro/speech, wants to continue speech therapy for swallowing, he has had weight loss with lasix and not eating as much due to grief reaction.  Wt Readings from Last 5 Encounters:  03/09/20 168 lb 12.8 oz (76.6 kg)  02/19/20 166 lb 3.2 oz (75.4 kg)  02/13/20 193 lb (87.5 kg)  02/04/20 184 lb (83.5 kg)  01/29/20 180 lb 12.8 oz (82 kg)     Lab Results  Component Value Date   WBC 5.0 03/09/2020   HGB 12.8 (L) 03/09/2020   HCT 37.5 (L) 03/09/2020   MCV 83.7 03/09/2020   PLT 170 03/09/2020   Lab Results  Component Value Date   CREATININE 0.92 03/09/2020   BUN 19 03/09/2020   NA 142 03/09/2020   K 4.3 03/09/2020   CL 105 03/09/2020   CO2 29 03/09/2020   Lab Results  Component Value Date   INR 1.0 03/09/2020   INR 1.1 01/16/2020   INR 0.99 07/25/2017     Blood pressure 122/70, pulse 100, temperature 97.7 F (36.5 C), weight 168 lb 12.8 oz (76.6 kg), SpO2 99 %.  Medications  Current Outpatient Medications  (Endocrine & Metabolic):  .  levothyroxine (SYNTHROID) 50 MCG tablet, Take 1 tablet daily on an empty stomach with only water for 30 minutes & no Antacid meds, Calcium or Magnesium for 4 hours & avoid Biotin .  metFORMIN (GLUCOPHAGE-XR) 500 MG 24 hr tablet, Take 2 tablets 2 x  /day with Meals for Diabetes  Current Outpatient Medications (Cardiovascular):  .  atorvastatin (LIPITOR) 40 MG tablet, Take 1 tablet at bedtime for cholestaerol. .  carvedilol (COREG) 3.125 MG tablet, Take 1 tablet (3.125 mg total) by mouth 2 (two) times daily. .  furosemide (LASIX) 40 MG tablet, Take 1 tablet (40 mg total) by mouth daily.  Current Outpatient Medications (Respiratory):  .  sodium chloride (OCEAN) 0.65 % nasal spray, Place 2 sprays into the nose as needed for congestion.   Current Outpatient Medications (Hematological):  .  apixaban (ELIQUIS) 5 MG TABS tablet, Take 1 tablet 2 x /day to Prevent Blood Clots .  Cyanocobalamin (B-12) 50 MCG TABS, Take 50 mcg by mouth daily.  Current Outpatient Medications (Other):  [name]  ALPRAZolam (XANAX) 1 MG tablet, Take 1/2-1 tablet at Bedtime  ONLY if needed for Sleep &  limit to 5 days /week to avoid Addiction & Dementia .  Blood Glucose Monitoring Suppl (ONE  TOUCH ULTRA 2) w/Device KIT, Check blood sugar 1 time a day-DX-E11.9 .  Cholecalciferol (VITAMIN D PO), Take 1 tablet by mouth daily.  .  finasteride (PROSCAR) 5 MG tablet, Take 1 tablet Daily for Prostate .  gabapentin (NEURONTIN) 100 MG capsule, Take 1 to 3 capsules 1 hour before Bedtime for Diabetic Neuropathy Pains .  glucose blood (ONETOUCH ULTRA) test strip, Check blood sugar 1 time a day-DX-E11.9 .  Lancets (ONETOUCH DELICA PLUS [id number]) MISC, CHECK BLOOD SUGAR ONCE A DAY. [name]  Lancets Misc. (ONE TOUCH SURESOFT) MISC, Check blood sugar 1 time a day-DX-E11.91 .  Magnesium 250 MG TABS, Take 250 mg by mouth daily.  .  Multiple Vitamin (MULTIVITAMIN) capsule, Take 1 capsule by mouth daily.   [name]  omeprazole  (PRILOSEC) 40 MG capsule, Take 1 capsule (40 mg total) by mouth daily. .  potassium chloride SA (KLOR-CON) 20 MEQ tablet, Take one tablet by mouth daily with furosemide (lasix).  Take with food. .  triamcinolone ointment (KENALOG) 0.1 %, APPLY TWICE DAILY. .  vitamin C (ASCORBIC ACID) 500 MG tablet, Take 500 mg by mouth daily.    Problem list He has Paroxysmal atrial fibrillation ([place]); MGUS (monoclonal gammopathy of unknown significance); Essential hypertension; Hyperlipidemia associated with type 2 diabetes mellitus ([place]); Type 2 diabetes mellitus ([facility]); BPH (benign prostatic hyperplasia); Medication management; Vitamin D deficiency; Medicare annual wellness visit, subsequent; Hypothyroidism; History of CVA with residual deficit; Hypoalbuminemia due to protein-calorie malnutrition ([place]); Ataxia, post-stroke; Labile blood glucose; Chronic anticoagulation; CKD stage 2 due to type 2 diabetes mellitus ([place]); Late effect of cerebrovascular accident (CVA); Acquired thrombophilia ([place]); Abdominal aortic atherosclerosis ([place]); and Neurologic gait disorder on their problem list.  Review of Systems  Constitutional: Positive for appetite change, fatigue and unexpected weight change. Negative for activity change, chills, diaphoresis and fever.  HENT: Positive for nosebleeds, rhinorrhea and trouble swallowing. Negative for congestion, dental problem, drooling, ear discharge, ear pain, facial swelling, hearing loss, mouth sores, postnasal drip, sinus pressure, sinus pain, sneezing, sore throat, tinnitus and voice change.   Respiratory: Negative.   Cardiovascular: Negative.   Gastrointestinal: Negative.   Genitourinary: Negative.   Musculoskeletal: Negative.   Skin: Negative.  Negative for rash and wound.  Neurological: Negative.   Hematological: Negative for adenopathy. Does not bruise/bleed easily.  Psychiatric/Behavioral: Positive for dysphoric mood (appropriately tearful about his wife). Negative for  agitation, behavioral problems, confusion, decreased concentration, hallucinations, self-injury, sleep disturbance and suicidal ideas. The patient is not nervous/anxious and is not hyperactive.        Objective:   Physical Exam Constitutional:      General: He is not in acute distress.    Appearance: Normal appearance.  HENT:     Right Ear: Tympanic membrane, ear canal and external ear normal.     Left Ear: Tympanic membrane, ear canal and external ear normal.     Nose: Mucosal edema present. No nasal deformity, septal deviation, laceration or nasal tenderness.     Right Nostril: No foreign body, epistaxis, septal hematoma or occlusion.     Left Nostril: No foreign body, epistaxis (some dried blood on left nostril but no visible superical vein), septal hematoma or occlusion.     Right Turbinates: Swollen. Not enlarged or pale.     Left Turbinates: Enlarged and swollen. Not pale.     Right Sinus: No maxillary sinus tenderness or frontal sinus tenderness.     Left Sinus: No maxillary sinus tenderness or frontal sinus tenderness.  Mouth/Throat:     Lips: No lesions.     Mouth: Mucous membranes are moist.     Pharynx: Oropharynx is clear. Uvula midline. No oropharyngeal exudate or posterior oropharyngeal erythema.  Eyes:     Pupils: Pupils are equal, round, and reactive to light.  Neck:     Thyroid: Thyromegaly present. No thyroid mass or thyroid tenderness.     Trachea: Trachea and phonation normal.  Cardiovascular:     Rate and Rhythm: Normal rate. Rhythm irregularly irregular.     Pulses: Normal pulses.  Pulmonary:     Effort: Pulmonary effort is normal.     Breath sounds: Normal breath sounds. No decreased breath sounds, wheezing, rhonchi or rales.  Abdominal:     General: Bowel sounds are decreased.     Palpations: Abdomen is soft. There is no shifting dullness, fluid wave or mass.     Tenderness: There is no abdominal tenderness. There is no right CVA tenderness, left CVA  tenderness, guarding or rebound. Negative signs include Murphy's sign.  Musculoskeletal:     Cervical back: Neck supple. No tenderness.     Right lower leg: 1+ Pitting Edema present.     Left lower leg: 1+ Pitting Edema present.     Comments: Wearing compression socks  Lymphadenopathy:     Cervical: No cervical adenopathy.  Skin:    General: Skin is warm.     Findings: No rash.  Neurological:     General: No focal deficit present.     Mental Status: He is alert.     Gait: Gait abnormal (antalgic with cane).  Psychiatric:        Thought Content: Thought content normal.        Judgment: Judgment normal.     Comments: Patient appropriately tearful while discussing his wife's passing       Assessment & Plan:   Bleeding from the nose No overt bleeding on exam, no visible area to cauterize Discussed using saline nasal spray Will refer to ENT for evaluation since patient is on blood thinner to see if there is a source to cauterize -     CBC with Differential/Platelet -     COMPLETE METABOLIC PANEL WITH GFR -     Ambulatory referral to ENT -     sodium chloride (OCEAN) 0.65 % nasal spray; Place 2 sprays into the nose as needed for congestion.  Acquired thrombophilia ([place]) No overt bleeding on exam, no visible area to cauterize Check labs -     CBC with Differential/Platelet -     COMPLETE METABOLIC PANEL WITH GFR -     Protime-INR  Elevated LFTs -     COMPLETE METABOLIC PANEL WITH GFR     Future Appointments  Date Time Provider [place]  03/19/2020 11:00 AM Garnet Sierras, NP [facility] None  03/30/2020 11:30 AM [facility] DEVICE REMOTES [facility]  03/31/2020 11:00 AM Garnet Sierras, NP [facility] None  04/16/2020  2:45 PM Sharen Counter, CCC-SLP [facility]  05/04/2020 11:30 AM [facility] DEVICE REMOTES [facility]  05/12/2020 10:20 AM Jamse Arn, MD [facility] CPR  06/08/2020 11:30 AM [facility] DEVICE REMOTES [facility]  [facility]  07/13/2020 10:00 AM [name], MD [facility] None  07/13/2020 11:30 AM [facility] DEVICE REMOTES [facility]  08/05/2020 10:45 AM Frann Rider, NP [facility] None  08/17/2020 11:30 AM [facility] DEVICE REMOTES [facility]  09/21/2020 11:30 AM [facility] DEVICE REMOTES [facility]  10/26/2020 11:30 AM [facility] DEVICE REMOTES [facility]  11/30/2020 11:30 AM [facility] DEVICE REMOTES [facility]  01/01/2021 10:00 AM [facility] LAB 1 [facility] None  01/08/2021  9:30 AM Shadad, Mathis Dad, MD [facility] None

## 2020-03-10 ENCOUNTER — Encounter: Payer: Self-pay | Admitting: Internal Medicine

## 2020-03-10 ENCOUNTER — Ambulatory Visit: Payer: Medicare Other | Admitting: Adult Health Nurse Practitioner

## 2020-03-10 ENCOUNTER — Telehealth: Payer: Self-pay

## 2020-03-10 LAB — COMPLETE METABOLIC PANEL WITH GFR
AG Ratio: 1.9 (calc) (ref 1.0–2.5)
ALT: 46 U/L (ref 9–46)
AST: 30 U/L (ref 10–35)
Albumin: 3.9 g/dL (ref 3.6–5.1)
Alkaline phosphatase (APISO): 57 U/L (ref 35–144)
BUN: 19 mg/dL (ref 7–25)
CO2: 29 mmol/L (ref 20–32)
Calcium: 9.4 mg/dL (ref 8.6–10.3)
Chloride: 105 mmol/L (ref 98–110)
Creat: 0.92 mg/dL (ref 0.70–1.18)
GFR, Est African American: 92 mL/min/{1.73_m2} (ref >=60)
GFR, Est Non African American: 79 mL/min/{1.73_m2} (ref >=60)
Globulin: 2.1 g/dL (calc) (ref 1.9–3.7)
Glucose, Bld: 136 mg/dL — ABNORMAL HIGH (ref 65–99)
Potassium: 4.3 mmol/L (ref 3.5–5.3)
Sodium: 142 mmol/L (ref 135–146)
Total Bilirubin: 0.4 mg/dL (ref 0.2–1.2)
Total Protein: 6 g/dL — ABNORMAL LOW (ref 6.1–8.1)

## 2020-03-10 LAB — CBC WITH DIFFERENTIAL/PLATELET
Absolute Monocytes: 445 cells/uL (ref 200–950)
Basophils Absolute: 30 cells/uL (ref 0–200)
Basophils Relative: 0.6 %
Eosinophils Absolute: 50 cells/uL (ref 15–500)
Eosinophils Relative: 1 %
HCT: 37.5 % — ABNORMAL LOW (ref 38.5–50.0)
Hemoglobin: 12.8 g/dL — ABNORMAL LOW (ref 13.2–17.1)
Lymphs Abs: 1675 cells/uL (ref 850–3900)
MCH: 28.6 pg (ref 27.0–33.0)
MCHC: 34.1 g/dL (ref 32.0–36.0)
MCV: 83.7 fL (ref 80.0–100.0)
MPV: 11.2 fL (ref 7.5–12.5)
Monocytes Relative: 8.9 %
Neutro Abs: 2800 cells/uL (ref 1500–7800)
Neutrophils Relative %: 56 %
Platelets: 170 10*3/uL (ref 140–400)
RBC: 4.48 10*6/uL (ref 4.20–5.80)
RDW: 13.5 % (ref 11.0–15.0)
Total Lymphocyte: 33.5 %
WBC: 5 10*3/uL (ref 3.8–10.8)

## 2020-03-10 LAB — PROTIME-INR
INR: 1
Prothrombin Time: 10.6 s (ref 9.0–11.5)

## 2020-03-10 NOTE — Telephone Encounter (Signed)
Called [facility] to request/suggest prescription for pt for modified barium swallow exam for pt. SLP provided fax number of Acute Rehab Services at [facility] [phone]) on voice mail message for Katrina, referral coordinator.

## 2020-03-10 NOTE — Telephone Encounter (Signed)
Received message from Garald Balding, SLP [phone], requesting Modified Barium Swallow order. Per Dr [name] faxed rx for order to [phone], [facility] Care Re[facility] Department.

## 2020-03-13 ENCOUNTER — Other Ambulatory Visit (HOSPITAL_COMMUNITY): Payer: Self-pay

## 2020-03-13 DIAGNOSIS — R1310 Dysphagia, unspecified: Secondary | ICD-10-CM

## 2020-03-17 ENCOUNTER — Telehealth: Payer: Self-pay | Admitting: Physician Assistant

## 2020-03-17 ENCOUNTER — Encounter: Payer: Medicare Other | Admitting: Speech Pathology

## 2020-03-17 NOTE — Telephone Encounter (Signed)
Patient called to advise he is having another left nostril nose bleed. advised to lean forward, hold pressure constant x 20 mins, no looking and if it is not better after 1-2 hours go to the ER. Confirmed ENT appt 03-24-20

## 2020-03-19 ENCOUNTER — Other Ambulatory Visit: Payer: Self-pay

## 2020-03-19 ENCOUNTER — Ambulatory Visit (INDEPENDENT_AMBULATORY_CARE_PROVIDER_SITE_OTHER): Payer: Medicare Other | Admitting: Physician Assistant

## 2020-03-19 ENCOUNTER — Encounter: Payer: Self-pay | Admitting: Physician Assistant

## 2020-03-19 VITALS — BP 126/80 | HR 64 | Temp 97.9°F | Ht 71.0 in | Wt 171.0 lb

## 2020-03-19 DIAGNOSIS — R269 Unspecified abnormalities of gait and mobility: Secondary | ICD-10-CM

## 2020-03-19 DIAGNOSIS — R6889 Other general symptoms and signs: Secondary | ICD-10-CM | POA: Diagnosis not present

## 2020-03-19 DIAGNOSIS — E8809 Other disorders of plasma-protein metabolism, not elsewhere classified: Secondary | ICD-10-CM

## 2020-03-19 DIAGNOSIS — E1122 Type 2 diabetes mellitus with diabetic chronic kidney disease: Secondary | ICD-10-CM

## 2020-03-19 DIAGNOSIS — I700 Atherosclerosis of aorta: Secondary | ICD-10-CM

## 2020-03-19 DIAGNOSIS — I10 Essential (primary) hypertension: Secondary | ICD-10-CM | POA: Diagnosis not present

## 2020-03-19 DIAGNOSIS — D6869 Other thrombophilia: Secondary | ICD-10-CM | POA: Diagnosis not present

## 2020-03-19 DIAGNOSIS — Z130 Encounter for screening for diseases of the blood and blood-forming organs and certain disorders involving the immune mechanism: Secondary | ICD-10-CM

## 2020-03-19 DIAGNOSIS — Z79899 Other long term (current) drug therapy: Secondary | ICD-10-CM

## 2020-03-19 DIAGNOSIS — I480 Paroxysmal atrial fibrillation: Secondary | ICD-10-CM

## 2020-03-19 DIAGNOSIS — E1169 Type 2 diabetes mellitus with other specified complication: Secondary | ICD-10-CM

## 2020-03-19 DIAGNOSIS — E785 Hyperlipidemia, unspecified: Secondary | ICD-10-CM

## 2020-03-19 DIAGNOSIS — E559 Vitamin D deficiency, unspecified: Secondary | ICD-10-CM | POA: Diagnosis not present

## 2020-03-19 DIAGNOSIS — Z0001 Encounter for general adult medical examination with abnormal findings: Secondary | ICD-10-CM | POA: Diagnosis not present

## 2020-03-19 DIAGNOSIS — E46 Unspecified protein-calorie malnutrition: Secondary | ICD-10-CM | POA: Diagnosis not present

## 2020-03-19 DIAGNOSIS — E039 Hypothyroidism, unspecified: Secondary | ICD-10-CM

## 2020-03-19 DIAGNOSIS — I69393 Ataxia following cerebral infarction: Secondary | ICD-10-CM

## 2020-03-19 DIAGNOSIS — I6930 Unspecified sequelae of cerebral infarction: Secondary | ICD-10-CM

## 2020-03-19 DIAGNOSIS — Z0000 Encounter for general adult medical examination without abnormal findings: Secondary | ICD-10-CM

## 2020-03-19 DIAGNOSIS — N182 Chronic kidney disease, stage 2 (mild): Secondary | ICD-10-CM

## 2020-03-19 DIAGNOSIS — D472 Monoclonal gammopathy: Secondary | ICD-10-CM

## 2020-03-19 NOTE — Patient Instructions (Addendum)
Do saline nasal spray Get saline drops for your eyes and follow up with eye doctor Follow up with dentist  Follow up Dr. Lucia Gaskins  WEIGHT GAIN  Try to make sure you are eating plenty of high calorie dense foods like: Avocado Nuts Peanut butter Oatmeal Dates Cottage cheese [place] yogurt Protein powder- low sugar  VENOUS INSUFFICIENCY Our lower leg venous system is not the most reliable, the heart does NOT pump fluid up, there is a valve system.  The muscles of the leg squeeze and the blood moves up and a valve opens and close, then they squeeze, blood moves up and valves open and closes keeping the blood moving towards the heart.  Lots can go wrong with this valve system.  If someone is sitting or standing without movement, everyone will get swelling.  THINGS TO DO:  Do not stand or sit in one position for long periods of time. Do not sit with your legs crossed. Rest with your legs raised during the day.  Your legs have to be higher than your heart so that gravity will force the valves to open, so please really elevate your legs.   Wear elastic stockings or support hose. Do not wear other tight, encircling garments around the legs, pelvis, or waist.  [employer]  has a wide variety of well priced compression stockings. [place], [place] [address] [id number] [place] has a good cheap selection, I like the socks, they are not as hard to get on  Walk as much as possible to increase blood flow.  Raise the foot of your bed at night with 2-inch blocks.  SEEK MEDICAL CARE IF:   The skin around your ankle starts to break down.  You have pain, redness, tenderness, or hard swelling developing in your leg over a vein.  You are uncomfortable due to leg pain.  If you ever have shortness of breath with exertion or chest pain go to the ER.    Ask insurance and pharmacy about shingrix - it is a 2 part shot that we will not be getting in the office.   Suggest  getting AFTER covid vaccines, have to wait at least a month This shot can make you feel bad due to such good immune response it can trigger some inflammation so take tylenol or aleve day of or day after and plan on resting.   Can go to [email or web address] for more information  Shingrix Vaccination  Two vaccines are licensed and recommended to prevent shingles in the [place] Zoster vaccine live (ZVL, Zostavax) has been in use since 2006. Recombinant zoster vaccine (RZV, Shingrix), has been in use since 2017 and is recommended by [employer] as the preferred shingles vaccine.  What Everyone Should Know about Shingles Vaccine (Shingrix) One of the Recommended Vaccines by Disease Shingles vaccination is the only way to protect against shingles and postherpetic neuralgia (PHN), the most common complication from shingles. CDC recommends that healthy adults 50 years and older get two doses of the shingles vaccine called Shingrix (recombinant zoster vaccine), separated by 2 to 6 months, to prevent shingles and the complications from the disease. Your doctor or pharmacist can give you Shingrix as a shot in your upper arm. Shingrix provides strong protection against shingles and PHN. Two doses of Shingrix is more than 90% effective at preventing shingles and PHN. Protection stays above 85% for at least the first four years after you get vaccinated. Shingrix is the preferred vaccine, over  Zostavax (zoster vaccine live), a shingles vaccine in use since 2006. Zostavax may still be used to prevent shingles in healthy adults 60 years and older. For example, you could use Zostavax if a person is allergic to Shingrix, prefers Zostavax, or requests immediate vaccination and Shingrix is unavailable. Who Should Get Shingrix? Healthy adults 50 years and older should get two doses of Shingrix, separated by 2 to 6 months. You should get Shingrix even if in the past you . had shingles   . received Zostavax  . are not sure if you had chickenpox There is no maximum age for getting Shingrix. If you had shingles in the past, you can get Shingrix to help prevent future occurrences of the disease. There is no specific length of time that you need to wait after having shingles before you can receive Shingrix, but generally you should make sure the shingles rash has gone away before getting vaccinated. You can get Shingrix whether or not you remember having had chickenpox in the past. Studies show that more than 99% of Americans 40 years and older have had chickenpox, even if they don't remember having the disease. Chickenpox and shingles are related because they are caused by the same virus (varicella zoster virus). After a person recovers from chickenpox, the virus stays dormant (inactive) in the body. It can reactivate years later and cause shingles. If you had Zostavax in the recent past, you should wait at least eight weeks before getting Shingrix. Talk to your healthcare provider to determine the best time to get Shingrix. Shingrix is available in [facility] and pharmacies. To find doctor's offices or pharmacies near you that offer the vaccine, visit [facility] Vaccine FinderExternal. If you have questions about Shingrix, talk with your healthcare provider. Vaccine for Those 63 Years and Older  Shingrix reduces the risk of shingles and PHN by more than 90% in people 32 and older. CDC recommends the vaccine for healthy adults 48 and older.  Who Should Not Get Shingrix? You should not get Shingrix if you: . have ever had a severe allergic reaction to any component of the vaccine or after a dose of Shingrix  . tested negative for immunity to varicella zoster virus. If you test negative, you should get chickenpox vaccine.  . currently have shingles  . currently are pregnant or breastfeeding. Women who are pregnant or breastfeeding should wait to get Shingrix.  [name] receive specific  antiviral drugs (acyclovir, famciclovir, or valacyclovir) 24 hours before vaccination (avoid use of these antiviral drugs for 14 days after vaccination)- zoster vaccine live only If you have a minor acute (starts suddenly) illness, such as a cold, you may get Shingrix. But if you have a moderate or severe acute illness, you should usually wait until you recover before getting the vaccine. This includes anyone with a temperature of 101.70F or higher. The side effects of the Shingrix are temporary, and usually last 2 to 3 days. While you may experience pain for a few days after getting Shingrix, the pain will be less severe than having shingles and the complications from the disease. How Well Does Shingrix Work? Two doses of Shingrix provides strong protection against shingles and postherpetic neuralgia (PHN), the most common complication of shingles. . In adults 42 to 79 years old who got two doses, Shingrix was 97% effective in preventing shingles; among adults 70 years and older, Shingrix was 91% effective.  . In adults 48 to 79 years old who got two doses, Shingrix was  91% effective in preventing PHN; among adults 70 years and older, Shingrix was 89% effective. Shingrix protection remained high (more than 85%) in people 70 years and older throughout the four years following vaccination. Since your risk of shingles and PHN increases as you get older, it is important to have strong protection against shingles in your older years. Top of Page  What Are the Possible Side Effects of Shingrix? Studies show that Shingrix is safe. The vaccine helps your body create a strong defense against shingles. As a result, you are likely to have temporary side effects from getting the shots. The side effects may affect your ability to do normal daily activities for 2 to 3 days. Most people got a sore arm with mild or moderate pain after getting Shingrix, and some also had redness and swelling where they got the shot. Some  people felt tired, had muscle pain, a headache, shivering, fever, stomach pain, or nausea. About 1 out of 6 people who got Shingrix experienced side effects that prevented them from doing regular activities. Symptoms went away on their own in about 2 to 3 days. Side effects were more common in younger people. You might have a reaction to the first or second dose of Shingrix, or both doses. If you experience side effects, you may choose to take over-the-counter pain medicine such as ibuprofen or acetaminophen. If you experience side effects from Shingrix, you should report them to the Vaccine Adverse Event Reporting System (VAERS). Your doctor might file this report, or you can do it yourself through the VAERS websiteExternal, or by calling [phone]. If you have any questions about side effects from Shingrix, talk with your doctor. The shingles vaccine does not contain thimerosal (a preservative containing mercury). Top of Page  When Should I See a Doctor Because of the Side Effects I Experience From Shingrix? In clinical trials, Shingrix was not associated with serious adverse events. In fact, serious side effects from vaccines are extremely rare. For example, for every 1 million doses of a vaccine given, only one or two people may have a severe allergic reaction. Signs of an allergic reaction happen within minutes or hours after vaccination and include hives, swelling of the face and throat, difficulty breathing, a fast heartbeat, dizziness, or weakness. If you experience these or any other life-threatening symptoms, see a doctor right away. Shingrix causes a strong response in your immune system, so it may produce short-term side effects more intense than you are used to from other vaccines. These side effects can be uncomfortable, but they are expected and usually go away on their own in 2 or 3 days. Top of Page  How Can I Pay For Shingrix? There are several ways shingles vaccine may be paid  for: Medicare . Medicare Part D plans cover the shingles vaccine, but there may be a cost to you depending on your plan. There may be a copay for the vaccine, or you may need to pay in full then get reimbursed for a certain amount.  . Medicare Part B does not cover the shingles vaccine. Medicaid . Medicaid may or may not cover the vaccine. Contact your insurer to find out. Private health insurance . Many private health insurance plans will cover the vaccine, but there may be a cost to you depending on your plan. Contact your insurer to find out. Vaccine assistance programs . Some pharmaceutical companies provide vaccines to eligible adults who cannot afford them. You may want to check with the vaccine  manufacturer, [employer], about Shingrix. If you do not currently have health insurance, learn more about affordable health coverage optionsExternal. To find doctor's offices or pharmacies near you that offer the vaccine, visit [facility] Vaccine FinderExternal.

## 2020-03-19 NOTE — Progress Notes (Signed)
MEDICARE ANNUAL WELLNESS VISIT AND FOLLOW UP Assessment:   Encounter for Medicare annual wellness exam 1 year, get shingrix  Paroxysmal atrial fibrillation ([place]) Continue medications, rate controlled, follow up cardio  Hyperlipidemia associated with type 2 diabetes mellitus ([place]) -     Lipid panel check lipids decrease fatty foods increase activity.   Type 2 diabetes mellitus with stage 2 chronic kidney disease, without long-term current use of insulin (HCC) -     Hemoglobin A1c Discussed general issues about diabetes pathophysiology and management., Educational material distributed., Suggested low cholesterol diet., Encouraged aerobic exercise., Discussed foot care., Reminded to get yearly retinal exam.  Hypoalbuminemia due to protein-calorie malnutrition ([place]) -     COMPLETE METABOLIC PANEL WITH GFR Increase protein, continue speech therapy  CKD stage 2 due to type 2 diabetes mellitus (HCC) -     Hemoglobin A1c Discussed general issues about diabetes pathophysiology and management., Educational material distributed., Suggested low cholesterol diet., Encouraged aerobic exercise., Discussed foot care., Reminded to get yearly retinal exam.  Abdominal aortic atherosclerosis ([place]) Control blood pressure, cholesterol, glucose, increase exercise.   Acquired thrombophilia ([place]) -     CBC with Differential/Platelet monitor  Hypothyroidism, unspecified type -     TSH Hypothyroidism-check TSH level, continue medications the same, reminded to take on an empty stomach 30-63mns before food.   MGUS (monoclonal gammopathy of unknown significance) Continue to monitor  Essential hypertension -     CBC with Differential/Platelet -     COMPLETE METABOLIC PANEL WITH GFR -     TSH - continue medications, DASH diet, exercise and monitor at home. Call if greater than 130/80.   History of CVA with residual deficit Monitor  Medication management -     Magnesium  Vitamin D deficiency -      VITAMIN D 25 Hydroxy (Vit-D Deficiency, Fractures)  Late effect of cerebrovascular accident (CVA) Monitor  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, no falls  Screening, anemia, deficiency, iron -     Iron,Total/Total Iron Binding Cap -     Ferritin  Over 30 minutes of exam, counseling, chart review, and critical decision making was performed  Future Appointments  Date Time Provider [place] 03/20/2020  1:00 PM WL-REHBL SPEECH THERAPIST [facility]-RE[facility] [place]  03/20/2020  1:00 PM WL-DG R/F 2 WL-DG   03/24/2020  3:15 PM NRozetta Nunnery MD ENT-CN None  03/30/2020 11:30 AM [facility] DEVICE REMOTES [facility]  04/16/2020  2:45 PM SSharen Counter CCC-SLP [facility] [place] 05/04/2020 11:30 AM [facility] DEVICE REMOTES [facility]  05/12/2020 10:20 AM PJamse Arn MD CPR-PRMA CPR  06/08/2020 11:30 AM [facility] DEVICE REMOTES [facility]  07/13/2020 10:00 AM MUnk Pinto MD GAAM-GAAIM None  07/13/2020 11:30 AM [facility] DEVICE REMOTES [facility]  08/05/2020 10:45 AM MFrann Rider NP GNA-GNA None  08/17/2020 11:30 AM [facility] DEVICE REMOTES [facility]  09/21/2020 11:30 AM [facility] DEVICE REMOTES [facility]  10/26/2020 11:30 AM [facility] DEVICE REMOTES [facility]  11/30/2020 11:30 AM [facility] DEVICE REMOTES [facility]  01/01/2021 10:00 AM [facility] LAB 1 [facility] None  01/08/2021  9:30 AM Shadad, FMathis Dad MD [facility] None     Plan:   During the course of the visit the patient was educated and counseled about appropriate screening and preventive services including:    Pneumococcal vaccine   Influenza vaccine  Prevnar 13  Td vaccine  Screening electrocardiogram  Colorectal cancer screening  Diabetes screening  Glaucoma screening  Nutrition  counseling    Subjective:  Earl [name] is a 79 y.o. male who presents for Medicare Annual Wellness Visit and 3 month follow up for HTN, hyperlipidemia DMII, CKD, atrial fibrillation, BPH, and vitamin D Def.    Patient is very tearful and upset about his wife's passing, Earl [name], have been married 28 years. His daughter is staying with him at night, Earl [name]  He also has history of CVA in 2018, Rt cerebellar thrombotic and placed on Eliquis for Afib.  He uses a cane for gait stability for right ataxia sp CVA. He has started speech therapy.  He has trace dysphagia relate CVA.  He is going to go twice a week, gets follow up swallow study.  He is driving locally to appointments, no highway/no night driving. He cooks in the morning for breakfast, will eat out for lunch/dinner or will make food for him to heat up.    States 1 month ago left eye having some irritation, watery, feeling like something in his eye, but it is not painful,, no redness. He has been having nose bleeds has had 2.   He is on gabapentin for neuropathy, only on 2 pills at night. Taking xanax occ.   BMI is Body mass index is 23.85 kg/m., he is working on diet and exercise. Wt Readings from Last 3 Encounters:  03/19/20 171 lb (77.6 kg)  03/09/20 168 lb 12.8 oz (76.6 kg)  02/19/20 166 lb 3.2 oz (75.4 kg)   He also follows with Dr Eldridge Scot for MGUS.   His blood pressure has been controlled at home, today their BP is BP: 126/80 He does not workout. He denies chest pain, shortness of breath, dizziness.  He is on cholesterol medication and denies myalgias. His cholesterol is at goal. The cholesterol last visit was:   Lab Results  Component Value Date   CHOL 111 12/16/2019   HDL 51 12/16/2019   LDLCALC 49 12/16/2019   TRIG 39 12/16/2019   CHOLHDL 2.2 12/16/2019   He has not been working on diet and exercise for diabetes, and denies hyperglycemia, hypoglycemia , nausea, paresthesia of the feet, polydipsia and polyuria. Last  A1C in the office was:  Lab Results  Component Value Date   HGBA1C 6.3 (H) 12/16/2019    Lab Results  Component Value Date   GFRAA 92 03/09/2020   Patient is on Vitamin D supplement.   Lab Results  Component Value Date   VD25OH 87 12/16/2019     He is on thyroid medication. His medication was not changed last visit.   Lab Results  Component Value Date   TSH 0.80 01/16/2020  .   Medication Review:  Current Outpatient Medications (Endocrine & Metabolic):  .  levothyroxine (SYNTHROID) 50 MCG tablet, Take 1 tablet daily on an empty stomach with only water for 30 minutes & no Antacid meds, Calcium or Magnesium for 4 hours & avoid Biotin .  metFORMIN (GLUCOPHAGE-XR) 500 MG 24 hr tablet, Take 2 tablets 2 x  /day with Meals for Diabetes  Current Outpatient Medications (Cardiovascular):  .  atorvastatin (LIPITOR) 40 MG tablet, Take 1 tablet at bedtime for cholestaerol. .  carvedilol (COREG) 3.125 MG tablet, Take 1 tablet (3.125 mg total) by mouth 2 (two) times daily. .  furosemide (LASIX) 40 MG tablet, Take 1 tablet (40 mg total) by mouth daily.  Current Outpatient Medications (Respiratory):  .  sodium chloride (OCEAN) 0.65 % nasal  spray, Place 2 sprays into the nose as needed for congestion.   Current Outpatient Medications (Hematological):  .  apixaban (ELIQUIS) 5 MG TABS tablet, Take 1 tablet 2 x /day to Prevent Blood Clots .  Cyanocobalamin (B-12) 50 MCG TABS, Take 50 mcg by mouth daily.  Current Outpatient Medications (Other):  [name]  ALPRAZolam (XANAX) 1 MG tablet, Take 1/2-1 tablet at Bedtime  ONLY if needed for Sleep &  limit to 5 days /week to avoid Addiction & Dementia .  Blood Glucose Monitoring Suppl (ONE TOUCH ULTRA 2) w/Device KIT, Check blood sugar 1 time a day-DX-E11.9 .  Cholecalciferol (VITAMIN D PO), Take 1 tablet by mouth daily.  .  finasteride (PROSCAR) 5 MG tablet, Take 1 tablet Daily for Prostate .  gabapentin (NEURONTIN) 100 MG capsule, Take 1 to 3 capsules 1  hour before Bedtime for Diabetic Neuropathy Pains .  glucose blood (ONETOUCH ULTRA) test strip, Check blood sugar 1 time a day-DX-E11.9 .  Lancets (ONETOUCH DELICA PLUS [id number]) MISC, CHECK BLOOD SUGAR ONCE A DAY. [name]  Lancets Misc. (ONE TOUCH SURESOFT) MISC, Check blood sugar 1 time a day-DX-E11.91 .  Magnesium 250 MG TABS, Take 250 mg by mouth daily.  .  Multiple Vitamin (MULTIVITAMIN) capsule, Take 1 capsule by mouth daily.   [name]  omeprazole (PRILOSEC) 40 MG capsule, Take 1 capsule (40 mg total) by mouth daily. .  potassium chloride SA (KLOR-CON) 20 MEQ tablet, Take one tablet by mouth daily with furosemide (lasix).  Take with food. .  triamcinolone ointment (KENALOG) 0.1 %, APPLY TWICE DAILY. .  vitamin C (ASCORBIC ACID) 500 MG tablet, Take 500 mg by mouth daily.    Allergies: Allergies  Allergen Reactions  . Penicillins Other (See Comments)    Current Problems (verified) has Paroxysmal atrial fibrillation ([place]); MGUS (monoclonal gammopathy of unknown significance); Essential hypertension; Hyperlipidemia associated with type 2 diabetes mellitus ([place]); Type 2 diabetes mellitus (HCC); BPH (benign prostatic hyperplasia); Medication management; Vitamin D deficiency; Medicare annual wellness visit, subsequent; Hypothyroidism; History of CVA with residual deficit; Hypoalbuminemia due to protein-calorie malnutrition ([place]); Ataxia, post-stroke; Labile blood glucose; Chronic anticoagulation; CKD stage 2 due to type 2 diabetes mellitus ([place]); Late effect of cerebrovascular accident (CVA); Acquired thrombophilia ([place]); Abdominal aortic atherosclerosis ([place]); and Neurologic gait disorder on their problem list.  Screening Tests Immunization History  Administered Date(s) Administered  . DT (Pediatric) 02/09/2015  . Influenza Split 08/07/2013  . Influenza, High Dose Seasonal PF 08/19/2014, 08/28/2015, 09/13/2016, 08/15/2017, 08/15/2018, 09/06/2019  . PFIZER SARS-COV-2 Vaccination 12/11/2019, 01/01/2020   . Pneumococcal Conjugate-13 06/10/2016  . Pneumococcal Polysaccharide-23 08/13/2008  . Td 11/22/2003    Preventative care: Last colonoscopy: 04/2019 EGD 04/2019 Echo/TEE 2018 EF 55%, MR MRI brain 2018 + stroke CXR 12/2019 CT head 2020 AB [place] 12/2019 fatty liver  Prior vaccinations: TD or Tdap: 2016  Influenza: 2020 Pneumococcal: 2009 Prevnar13: 2017 Shingrix given info  Names of Other Physician/Practitioners you currently use: 1. [facility] here for primary care 2. Diabetic Eye Exam: 11/ 2020  3. Dentist:  Due for 2020, has not been  Patient Care Team: [name], MD as PCP - General (Internal Medicine) Deboraha Sprang, MD as PCP - Cardiology (Cardiology) Inda Castle, MD (Inactive) as Consulting Physician (Gastroenterology) Wyatt Portela, MD as Consulting Physician (Oncology) Sharyne Peach, MD as Consulting Physician (Ophthalmology) Urology, 4/21st Dr Agustina Caroli*  Surgical: He  has a past surgical history that includes Inguinal hernia repair (Bilateral); TEE without cardioversion (  N/A, 07/28/2017); LOOP RECORDER INSERTION (N/A, 07/28/2017); Colonoscopy; and Circumcision (N/A, 06/11/2019). Family His family history includes Colon cancer in his father; Diabetes in his father; Sickle cell anemia in his mother; Stomach cancer in his father. Social history  He reports that he has never smoked. He has never used smokeless tobacco. He reports that he does not drink alcohol or use drugs.  MEDICARE WELLNESS OBJECTIVES: Physical activity: Current Exercise Habits: Home exercise routine(going to [employer]), Type of exercise: walking;Other - see comments Cardiac risk factors: Cardiac Risk Factors include: advanced age (>57mn, >>94women);diabetes mellitus;dyslipidemia;hypertension;male gender;sedentary lifestyle Depression/mood screen:   [facility]  Decreased Interest 0  Down, Depressed, Hopeless 0  PHQ - 2 Score 0   Altered sleeping -  Tired, decreased energy -  Change in appetite -  Feeling bad or failure about yourself  -  Trouble concentrating -  Moving slowly or fidgety/restless -  Suicidal thoughts -  PHQ-9 Score -  Difficult doing work/chores -  Some recent data might be hidden    ADLs:  In your present state of health, do you have any difficulty performing the following activities: 03/19/2020 12/15/2019  Hearing? N N  Vision? N N  Difficulty concentrating or making decisions? N N  Walking or climbing stairs? Y N  Comment but improving, uses rail -  Dressing or bathing? N N  Doing errands, shopping? N N  Preparing Food and eating ? N -  Using the Toilet? N -  In the past six months, have you accidently leaked urine? Y -  Do you have problems with loss of bowel control? N -  Managing your Medications? N -  Comment he does his own meds -  Managing your Finances? N -  Housekeeping or managing your Housekeeping? N -  Some recent data might be hidden     Cognitive Testing  Alert? Yes  Normal Appearance?Yes  Oriented to person? Yes  Place? Yes   Time? Yes  Recall of three objects?  1/3  Can perform simple calculations? Yes  Displays appropriate judgment?Yes  Can read the correct time from a watch face?Yes  EOL planning: Does Patient Have a Medical Advance Directive?: Yes Does patient want to make changes to medical advance directive?: No - Patient declined Yes  Objective:   Today's Vitals   03/19/20 1103  BP: 126/80  Pulse: 64  Temp: 97.9 F (36.6 C)  SpO2: 100%  Weight: 171 lb (77.6 kg)  Height: 5' 11" (1.803 m)  PainSc: 1    Body mass index is 23.85 kg/m.  General Appearance: Well nourished, in no apparent distress. Eyes: PERRLA, EOMs, conjunctiva no swelling or erythema Sinuses: No Frontal/maxillary tenderness ENT/Mouth: Ext aud canals clear, TMs without erythema, bulging. No erythema, swelling, or exudate on post pharynx.  Tonsils not swollen or erythematous.  Hearing normal.  Neck: Supple, thyroid normal.  Respiratory: Respiratory effort normal, BS equal bilaterally without rales, rhonchi, wheezing or stridor.  Cardio: RRR with no MRGs. Brisk peripheral pulses without edema.  Abdomen: Soft, + BS.  Non tender, no guarding, rebound, hernias, masses. Lymphatics: Non tender without lymphadenopathy.  Musculoskeletal: Full ROM, 5/5 strength, slow steady gait with cane Skin: Warm, dry without rashes, lesions, ecchymosis.  Neuro: Cranial nerves intact. No cerebellar symptoms. Sensation intact to monofilament in bil feet.  Psych: Awake and oriented X 3, intermittently tearful affect, Insight and Judgment appropriate.   Medicare Attestation I have personally reviewed: The patient's medical and social history Their use  of alcohol, tobacco or illicit drugs Their current medications and supplements The patient's functional ability including ADLs,fall risks, home safety risks, cognitive, and hearing and visual impairment Diet and physical activities Evidence for depression or mood disorders  The patient's weight, height, BMI, and visual acuity have been recorded in the chart.  I have made referrals, counseling, and provided education to the patient based on review of the above and I have provided the patient with a written personalized care plan for preventive services.    Future Appointments  Date Time Provider [place]  03/20/2020  1:00 PM WL-REHBL SPEECH THERAPIST WL-REHBL [place]  03/20/2020  1:00 PM WL-DG R/F 2 WL-DG [place]  03/24/2020  3:15 PM Rozetta Nunnery, MD ENT-CN None  03/30/2020 11:30 AM [facility] DEVICE REMOTES [facility]  04/16/2020  2:45 PM Sharen Counter, CCC-SLP [facility]-NR [facility]  05/04/2020 11:30 AM [facility] DEVICE REMOTES [facility]  05/12/2020 10:20 AM Jamse Arn, MD CPR-PRMA CPR  06/08/2020 11:30 AM [facility] DEVICE REMOTES [facility]  07/13/2020 10:00 AM [name], MD GAAM-GAAIM None  07/13/2020 11:30 AM [facility] DEVICE REMOTES [facility]  08/05/2020 10:45 AM Frann Rider, NP GNA-GNA None  08/17/2020 11:30 AM [facility] DEVICE REMOTES [facility]  09/21/2020 11:30 AM [facility] DEVICE REMOTES [facility]  10/26/2020 11:30 AM [facility] DEVICE REMOTES [facility]  11/30/2020 11:30 AM [facility] DEVICE REMOTES [facility]  01/01/2021 10:00 AM [facility]-MED[facility] LAB 1 [facility]MED[facility] None  01/08/2021  9:30 AM Alen Blew, Mathis Dad, MD [facility]MED[facility] None    03/19/2020  12:35 PM

## 2020-03-20 ENCOUNTER — Encounter (HOSPITAL_COMMUNITY): Payer: Medicare Other

## 2020-03-20 ENCOUNTER — Ambulatory Visit (HOSPITAL_COMMUNITY)
Admission: RE | Admit: 2020-03-20 | Discharge: 2020-03-20 | Disposition: A | Discharge status: Home / Self Care | Payer: Medicare Other | Source: Ambulatory Visit | Attending: Internal Medicine | Admitting: Internal Medicine

## 2020-03-20 DIAGNOSIS — R1310 Dysphagia, unspecified: Secondary | ICD-10-CM

## 2020-03-20 DIAGNOSIS — K219 Gastro-esophageal reflux disease without esophagitis: Secondary | ICD-10-CM | POA: Diagnosis not present

## 2020-03-20 LAB — COMPLETE METABOLIC PANEL WITH GFR
AG Ratio: 1.9 (calc) (ref 1.0–2.5)
ALT: 31 U/L (ref 9–46)
AST: 26 U/L (ref 10–35)
Albumin: 4 g/dL (ref 3.6–5.1)
Alkaline phosphatase (APISO): 59 U/L (ref 35–144)
BUN: 15 mg/dL (ref 7–25)
CO2: 28 mmol/L (ref 20–32)
Calcium: 9.5 mg/dL (ref 8.6–10.3)
Chloride: 105 mmol/L (ref 98–110)
Creat: 1.12 mg/dL (ref 0.70–1.18)
GFR, Est African American: 73 mL/min/{1.73_m2} (ref >=60)
GFR, Est Non African American: 63 mL/min/{1.73_m2} (ref >=60)
Globulin: 2.1 g/dL (calc) (ref 1.9–3.7)
Glucose, Bld: 102 mg/dL — ABNORMAL HIGH (ref 65–99)
Potassium: 4.2 mmol/L (ref 3.5–5.3)
Sodium: 142 mmol/L (ref 135–146)
Total Bilirubin: 0.4 mg/dL (ref 0.2–1.2)
Total Protein: 6.1 g/dL (ref 6.1–8.1)

## 2020-03-20 LAB — VITAMIN D 25 HYDROXY (VIT D DEFICIENCY, FRACTURES): Vit D, 25-Hydroxy: 71 ng/mL (ref 30–100)

## 2020-03-20 LAB — CBC WITH DIFFERENTIAL/PLATELET
Absolute Monocytes: 432 cells/uL (ref 200–950)
Basophils Absolute: 32 cells/uL (ref 0–200)
Basophils Relative: 0.7 %
Eosinophils Absolute: 60 cells/uL (ref 15–500)
Eosinophils Relative: 1.3 %
HCT: 39.4 % (ref 38.5–50.0)
Hemoglobin: 13.2 g/dL (ref 13.2–17.1)
Lymphs Abs: 1785 cells/uL (ref 850–3900)
MCH: 28.2 pg (ref 27.0–33.0)
MCHC: 33.5 g/dL (ref 32.0–36.0)
MCV: 84.2 fL (ref 80.0–100.0)
MPV: 11.1 fL (ref 7.5–12.5)
Monocytes Relative: 9.4 %
Neutro Abs: 2291 cells/uL (ref 1500–7800)
Neutrophils Relative %: 49.8 %
Platelets: 206 10*3/uL (ref 140–400)
RBC: 4.68 10*6/uL (ref 4.20–5.80)
RDW: 13.9 % (ref 11.0–15.0)
Total Lymphocyte: 38.8 %
WBC: 4.6 10*3/uL (ref 3.8–10.8)

## 2020-03-20 LAB — LIPID PANEL
Cholesterol: 123 mg/dL (ref <200)
HDL: 60 mg/dL (ref >=40)
LDL Cholesterol (Calc): 50 mg/dL (calc)
Non-HDL Cholesterol (Calc): 63 mg/dL (calc) (ref <130)
Total CHOL/HDL Ratio: 2.1 (calc) (ref <5.0)
Triglycerides: 46 mg/dL (ref <150)

## 2020-03-20 LAB — IRON, TOTAL/TOTAL IRON BINDING CAP
%SAT: 22 % (calc) (ref 20–48)
Iron: 67 ug/dL (ref 50–180)
TIBC: 303 mcg/dL (calc) (ref 250–425)

## 2020-03-20 LAB — HEMOGLOBIN A1C
Hgb A1c MFr Bld: 6.8 % of total Hgb — ABNORMAL HIGH (ref <5.7)
Mean Plasma Glucose: 148 (calc)
eAG (mmol/L): 8.2 (calc)

## 2020-03-20 LAB — MAGNESIUM: Magnesium: 2 mg/dL (ref 1.5–2.5)

## 2020-03-20 LAB — TSH: TSH: 0.36 mIU/L — ABNORMAL LOW (ref 0.40–4.50)

## 2020-03-20 LAB — FERRITIN: Ferritin: 42 ng/mL (ref 24–380)

## 2020-03-20 NOTE — Therapy (Signed)
Modified Barium Swallow Progress Note  Patient Details  Name: Earl [name] MRN: [id number] Date of Birth: 05/05/1941  Today's Date: 03/20/2020  Modified Barium Swallow completed.  Full report located under Chart Review in the Imaging Section.  Brief recommendations include the following:  Clinical Impression Pt presents with mild oral, moderate pharyngeal dysphagia, characterized as follows:  Orally, pt exhibits premature spill across consistencies. Piecemeal deglutition and poor bolus formation noted intermittently. No significant oral residue noted.  Pharyngeal swallow is characterized by delayed swallow reflex, with trigger noted at the pyriform on thin and nectar thick liquids, and at the vallecular sinus on puree, solid, and barium tablet trials. Trace penetration was seen on nectar thick liquids and thin liquids when use of chin tuck was implemented. Cued throat clear or cough was beneficial to reduce or remove penetrate. Silent aspiration of thin liquid was seen during the swallow when pt did not utilize chin tuck position. Slight vallecular and pyriform sinus residue noted after the swallow across consistencies. Barium tablet was tolerated with puree, pausing momentarily in the vallecular sinus before clearing the hypopharynx.   Given similar presentation on thin and nectar thick liquids, as well as pt's independence with recall of chin tuck, will continue current diet (regular/thin with chin tuck). Pt was encouraged to take small bites and sips, tuck chin with all po intake, take larger meds one at a time with puree. Pt was also encouraged to follow up with primary SLP at the outpatient clinic for further treatment recommendations.     Swallow Evaluation Recommendations  SLP Diet Recommendations: Regular solids;Thin liquid   Liquid Administration via: Cup;Straw   Medication Administration: Whole meds with puree   Supervision: Patient able to self feed;Intermittent supervision  to cue for compensatory strategies   Compensations: Minimize environmental distractions;Slow rate;Small sips/bites;Clear throat intermittently;Chin tuck;Use straw to facilitate chin tuck   Postural Changes: Seated upright at 90 degrees   Oral Care Recommendations: Oral care BID      [name] B. Quentin Ore, [facility], [place] Speech Language Pathologist Office: [phone] Pager: [phone]  Shonna Chock 03/20/2020,3:03 PM

## 2020-03-24 ENCOUNTER — Ambulatory Visit (INDEPENDENT_AMBULATORY_CARE_PROVIDER_SITE_OTHER): Payer: Medicare Other | Admitting: Otolaryngology

## 2020-03-24 ENCOUNTER — Encounter (INDEPENDENT_AMBULATORY_CARE_PROVIDER_SITE_OTHER): Payer: Self-pay | Admitting: Otolaryngology

## 2020-03-24 ENCOUNTER — Other Ambulatory Visit: Payer: Self-pay

## 2020-03-24 VITALS — Temp 97.9°F

## 2020-03-24 DIAGNOSIS — H6123 Impacted cerumen, bilateral: Secondary | ICD-10-CM

## 2020-03-24 DIAGNOSIS — R040 Epistaxis: Secondary | ICD-10-CM | POA: Diagnosis not present

## 2020-03-24 NOTE — Progress Notes (Signed)
HPI: Earl [name] is a 79 y.o. male who presents for evaluation of left-sided epistaxis.  He also wanted his ears checked and cleaned.  He is presently on Eliquis. He first had a nosebleed on the left side about 2 weeks ago.  He was able to stop this after 10 or 15 minutes.  He had a second nosebleed about a week ago on the left side and this also stopped in about 10 minutes with pressure and tissue. He has had no bleeding over the past 4 days.  Apparently the nosebleeds started spontaneously with no trauma and no picking at his nose.  Past Medical History:  Diagnosis Date  . Adenomatous colon polyp   . Allergy   . Anal fissure   . Anxiety   . Arthritis   . BPH (benign prostatic hyperplasia)   . Cataract   . Diabetes mellitus ([place])   . GERD (gastroesophageal reflux disease)   . Hyperlipidemia   . Hypertension   . Hypothyroidism   . Other testicular hypofunction   . Stroke ([place])   . Type II or unspecified type diabetes mellitus without mention of complication, not stated as uncontrolled    Past Surgical History:  Procedure Laterality Date  . CIRCUMCISION N/A 06/11/2019   Procedure: CIRCUMCISION ADULT;  Surgeon: [name], MD;  Location: [facility];  Service: Urology;  Laterality: N/A;  . COLONOSCOPY    . INGUINAL HERNIA REPAIR Bilateral    1968 and 2004  . LOOP RECORDER INSERTION N/A 07/28/2017   Procedure: LOOP RECORDER INSERTION;  Surgeon: Deboraha Sprang, MD;  Location: [place] [facility];  Service: Cardiovascular;  Laterality: N/A;  . TEE WITHOUT CARDIOVERSION N/A 07/28/2017   Procedure: TRANSESOPHAGEAL ECHOCARDIOGRAM (TEE);  Surgeon: Sueanne Margarita, MD;  Location: [facility] ENDOSCOPY;  Service: Cardiovascular;  Laterality: N/A;   Social History   Socioeconomic History  . Marital status: Widowed    Spouse name: Not on file  . Number of children: 2  . Years of education: Not on file  . Highest education level: Not on file  Occupational History  . Occupation:  Herbalist: RETIRED  Tobacco Use  . Smoking status: Never Smoker  . Smokeless tobacco: Never Used  Substance and Sexual Activity  . Alcohol use: No  . Drug use: No  . Sexual activity: Not on file  Other Topics Concern  . Not on file  Social History Narrative  . Not on file   Social Determinants of Health   Financial Resource Strain:   . Difficulty of Paying Living Expenses:   Food Insecurity:   . Worried About Charity fundraiser in the Last Year:   . Arboriculturist in the Last Year:   Transportation Needs:   . Film/video editor (Medical):   [name] Lack of Transportation (Non-Medical):   Physical Activity:   . Days of Exercise per Week:   . Minutes of Exercise per Session:   Stress:   . Feeling of Stress :   Social Connections:   . Frequency of Communication with Friends and Family:   . Frequency of Social Gatherings with Friends and Family:   . Attends Religious Services:   . Active Member of Clubs or Organizations:   . Attends Archivist Meetings:   [name] Marital Status:    Family History  Problem Relation Age of Onset  . Sickle cell anemia Mother   . Diabetes Father   . Stomach  cancer Father   . Colon cancer Father   . CAD Neg Hx   . Esophageal cancer Neg Hx   . Rectal cancer Neg Hx    Allergies  Allergen Reactions  . Penicillins Other (See Comments)   Prior to Admission medications   Medication Sig Start Date End Date Taking? Authorizing Provider  ALPRAZolam Duanne Moron) 1 MG tablet Take 1/2-1 tablet at Bedtime  ONLY if needed for Sleep &  limit to 5 days /week to avoid Addiction & Dementia 12/16/19  Yes [name], MD  apixaban (ELIQUIS) 5 MG TABS tablet Take 1 tablet 2 x /day to Prevent Blood Clots 07/10/19  Yes [name], MD  atorvastatin (LIPITOR) 40 MG tablet Take 1 tablet at bedtime for cholestaerol. 01/10/20  Yes [name], MD  Blood Glucose Monitoring Suppl (ONE TOUCH ULTRA 2) w/Device KIT  Check blood sugar 1 time a day-DX-E11.9 09/09/19  Yes [name], MD  carvedilol (COREG) 3.125 MG tablet Take 1 tablet (3.125 mg total) by mouth 2 (two) times daily. 01/21/20 01/20/21 Yes McClanahan, Danton Sewer, NP  Cholecalciferol (VITAMIN D PO) Take 1 tablet by mouth daily.    Yes [provider]  Cyanocobalamin (B-12) 50 MCG TABS Take 50 mcg by mouth daily.   Yes [provider]  finasteride (PROSCAR) 5 MG tablet Take 1 tablet Daily for Prostate 10/11/19  Yes [name], MD  furosemide (LASIX) 40 MG tablet Take 1 tablet (40 mg total) by mouth daily. 02/19/20 05/19/20 Yes Tillery, Satira Mccallum, PA-C  gabapentin (NEURONTIN) 100 MG capsule Take 1 to 3 capsules 1 hour before Bedtime for Diabetic Neuropathy Pains 12/16/19  Yes [name], MD  glucose blood [facility] ULTRA) test strip Check blood sugar 1 time a day-DX-E11.9 09/09/19  Yes [name], MD  Lancets [facility] DELICA PLUS [id number]) [place]. 01/28/20  Yes McClanahan, Danton Sewer, NP  Lancets Misc. (ONE TOUCH SURESOFT) MISC Check blood sugar 1 time a day-DX-E11.91 09/09/19  Yes [name], MD  levothyroxine (SYNTHROID) 50 MCG tablet Take 1 tablet daily on an empty stomach with only water for 30 minutes & no Antacid meds, Calcium or Magnesium for 4 hours & avoid Biotin 10/11/19  Yes [name], MD  Magnesium 250 MG TABS Take 250 mg by mouth daily.    Yes [provider]  metFORMIN (GLUCOPHAGE-XR) 500 MG 24 hr tablet Take 2 tablets 2 x  /day with Meals for Diabetes 10/11/19  Yes [name], MD  Multiple Vitamin (MULTIVITAMIN) capsule Take 1 capsule by mouth daily.     Yes [provider]  omeprazole (PRILOSEC) 40 MG capsule Take 1 capsule (40 mg total) by mouth daily. 11/18/19 11/17/20 Yes Vicie Mutters, PA-C  potassium chloride SA (KLOR-CON) 20 MEQ tablet Take one tablet by mouth daily with furosemide (lasix).  Take with food. 01/21/20  Yes McClanahan, Danton Sewer, NP   sodium chloride (OCEAN) 0.65 % nasal spray Place 2 sprays into the nose as needed for congestion. 03/09/20 03/09/21 Yes Vicie Mutters, PA-C  triamcinolone ointment (KENALOG) 0.1 % APPLY TWICE DAILY. 02/18/20  Yes McClanahan, Danton Sewer, NP  vitamin C (ASCORBIC ACID) 500 MG tablet Take 500 mg by mouth daily.     Yes [provider]     Positive ROS: Otherwise negative  All other systems have been reviewed and were otherwise negative with the exception of those mentioned in the HPI and as above.  Physical Exam: Constitutional: Alert, well-appearing, no acute distress Ears: External ears without lesions  or tenderness.  He had moderate wax buildup in both ears that was cleaned with curettes.  This was not obstructing his hearing.  TMs were clear bilaterally. Nasal: External nose without lesions. Septum midline..  He had 2 slightly prominent septal vessels on the left side.  However neither 1 of these it bled when I rub the vessel with a Q-tip.  He blew his nose firmly and still had no bleeding.  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 Oral: Lips and gums without lesions. Tongue and palate mucosa without lesions. Posterior oropharynx clear. Neck: No palpable adenopathy or masses Respiratory: Breathing comfortably  Skin: No facial/neck lesions or rash noted.  Cerumen impaction removal  Date/Time: 03/24/2020 5:46 PM Performed by: Rozetta Nunnery, MD Authorized by: Rozetta Nunnery, MD   Consent:    Consent obtained:  Verbal   Consent given by:  Patient   Risks discussed:  Pain and bleeding Procedure details:    Location:  L ear and R ear   Procedure type: curette   Post-procedure details:    Inspection:  TM intact and canal normal   Hearing quality:  Improved   Patient tolerance of procedure:  Tolerated well, no immediate complications Comments:     TMs were clear  bilaterally.    Assessment: Cerumen buildup. 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  Plan: I did not perform any cauterization today as I cannot definitely identify site or origin of the epistaxis. He will follow-up for recheck and cauterization if he has any further nosebleeds. Reviewed with him concerning stopping future nosebleed with cotton ball packing with cold water or Afrin and pressure.  Radene Journey, MD

## 2020-03-26 LAB — CUP PACEART REMOTE DEVICE CHECK
Date Time Interrogation Session: 20210505041617
Implantable Pulse Generator Implant Date: 20180907

## 2020-03-30 ENCOUNTER — Ambulatory Visit (INDEPENDENT_AMBULATORY_CARE_PROVIDER_SITE_OTHER): Payer: Medicare Other | Admitting: *Deleted

## 2020-03-30 DIAGNOSIS — I480 Paroxysmal atrial fibrillation: Secondary | ICD-10-CM

## 2020-03-30 DIAGNOSIS — I6930 Unspecified sequelae of cerebral infarction: Secondary | ICD-10-CM

## 2020-03-31 ENCOUNTER — Ambulatory Visit: Payer: Medicare Other | Admitting: Adult Health Nurse Practitioner

## 2020-03-31 NOTE — Progress Notes (Signed)
[facility] Summary Report / Loop Recorder 

## 2020-04-09 ENCOUNTER — Encounter: Payer: Self-pay | Admitting: Student

## 2020-04-09 ENCOUNTER — Other Ambulatory Visit: Payer: Self-pay

## 2020-04-09 ENCOUNTER — Ambulatory Visit (INDEPENDENT_AMBULATORY_CARE_PROVIDER_SITE_OTHER): Payer: Medicare Other | Admitting: Student

## 2020-04-09 VITALS — BP 130/70 | HR 88 | Ht 71.0 in | Wt 171.0 lb

## 2020-04-09 DIAGNOSIS — Z79899 Other long term (current) drug therapy: Secondary | ICD-10-CM

## 2020-04-09 DIAGNOSIS — I6930 Unspecified sequelae of cerebral infarction: Secondary | ICD-10-CM

## 2020-04-09 DIAGNOSIS — I5032 Chronic diastolic (congestive) heart failure: Secondary | ICD-10-CM | POA: Diagnosis not present

## 2020-04-09 DIAGNOSIS — I10 Essential (primary) hypertension: Secondary | ICD-10-CM

## 2020-04-09 NOTE — Progress Notes (Signed)
Electrophysiology Office Note Date: 04/09/2020  ID:  Earl [name], DOB 04/13/1941, MRN [id number]  PCP: [name], MD Primary Cardiologist: [name], MD Electrophysiologist: [name], MD   CC: ILR follow-up  Earl [name] is a 79 y.o. male seen today for Dr. Caryl Comes . he presents today for routine electrophysiology followup.  Since last being seen in our clinic, the patient reports doing very well.  he denies chest pain, palpitations, dyspnea, PND, orthopnea, nausea, vomiting, dizziness, syncope, edema, weight gain, or early satiety.  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 Adenomatous colon polyp   . Allergy   . Anal fissure   . Anxiety   . Arthritis   . BPH (benign prostatic hyperplasia)   . Cataract   . Diabetes mellitus ([place])   . GERD (gastroesophageal reflux disease)   . Hyperlipidemia   . Hypertension   . Hypothyroidism   . Other testicular hypofunction   . Stroke ([place])   . Type II or unspecified type diabetes mellitus without mention of complication, not stated as uncontrolled    Past Surgical History:  Procedure Laterality Date  . CIRCUMCISION N/A 06/11/2019   Procedure: CIRCUMCISION ADULT;  Surgeon: [name], MD;  Location: [facility];  Service: Urology;  Laterality: N/A;  . COLONOSCOPY    . INGUINAL HERNIA REPAIR Bilateral    1968 and 2004  . LOOP RECORDER INSERTION N/A 07/28/2017   Procedure: LOOP RECORDER INSERTION;  Surgeon: Deboraha Sprang, MD;  Location: [place] [facility];  Service: Cardiovascular;  Laterality: N/A;  . TEE WITHOUT CARDIOVERSION N/A 07/28/2017   Procedure: TRANSESOPHAGEAL ECHOCARDIOGRAM (TEE);  Surgeon: Sueanne Margarita, MD;  Location: [facility] ENDOSCOPY;  Service: Cardiovascular;  Laterality: N/A;    Current Outpatient Medications  Medication Sig Dispense Refill  . ALPRAZolam (XANAX) 1 MG tablet Take 1/2-1 tablet at Bedtime  ONLY if  needed for Sleep &  limit to 5 days /week to avoid Addiction & Dementia 60 tablet 0  . apixaban (ELIQUIS) 5 MG TABS tablet Take 1 tablet 2 x /day to Prevent Blood Clots 180 tablet 3  . atorvastatin (LIPITOR) 40 MG tablet Take 1 tablet at bedtime for cholestaerol. 90 tablet 3  . Blood Glucose Monitoring Suppl (ONE TOUCH ULTRA 2) w/Device KIT Check blood sugar 1 time a day-DX-E11.9 1 kit 0  . carvedilol (COREG) 3.125 MG tablet Take 1 tablet (3.125 mg total) by mouth 2 (two) times daily. 60 tablet 11  . Cholecalciferol (VITAMIN D PO) Take 1 tablet by mouth daily.     . Cyanocobalamin (B-12) 50 MCG TABS Take 50 mcg by mouth daily.    . finasteride (PROSCAR) 5 MG tablet Take 1 tablet Daily for Prostate 90 tablet 3  . furosemide (LASIX) 40 MG tablet Take 1 tablet (40 mg total) by mouth daily. 90 tablet 3  . gabapentin (NEURONTIN) 100 MG capsule Take 1 to 3 capsules 1 hour before Bedtime for Diabetic Neuropathy Pains 270 capsule 0  . glipiZIDE (GLUCOTROL) 5 MG tablet Take 5 mg by mouth daily.    [name] glucose blood (ONETOUCH ULTRA) test strip Check blood sugar 1 time a day-DX-E11.9 100 each 2  . Lancets (ONETOUCH DELICA PLUS [id number]) MISC CHECK BLOOD SUGAR ONCE A DAY. 100 each 0  . Lancets Misc. (ONE TOUCH SURESOFT) MISC Check blood sugar 1 time a day-DX-E11.91 1 each 0  . levothyroxine (SYNTHROID) 50 MCG tablet Take 1 tablet daily  on an empty stomach with only water for 30 minutes & no Antacid meds, Calcium or Magnesium for 4 hours & avoid Biotin 90 tablet 3  . Magnesium 250 MG TABS Take 250 mg by mouth daily.     . metFORMIN (GLUCOPHAGE-XR) 500 MG 24 hr tablet Take 500 mg by mouth 4 (four) times daily.    . Multiple Vitamin (MULTIVITAMIN) capsule Take 1 capsule by mouth daily.      . potassium chloride SA (KLOR-CON) 20 MEQ tablet Take one tablet by mouth daily with furosemide (lasix).  Take with food. 30 tablet 1  . sodium chloride (OCEAN) 0.65 % nasal spray Place 2 sprays into the nose as needed for  congestion. 30 mL 3  . triamcinolone ointment (KENALOG) 0.1 % APPLY TWICE DAILY. 15 g 0  . vitamin C (ASCORBIC ACID) 500 MG tablet Take 500 mg by mouth daily.       No current facility-administered medications for this visit.    Allergies:   Penicillins   Social History: Social History   Socioeconomic History  . Marital status: Widowed    Spouse name: Not on file  . Number of children: 2  . Years of education: Not on file  . Highest education level: Not on file  Occupational History  . Occupation: Herbalist: RETIRED  Tobacco Use  . Smoking status: Never Smoker  . Smokeless tobacco: Never Used  Substance and Sexual Activity  . Alcohol use: No  . Drug use: No  . Sexual activity: Not on file  Other Topics Concern  . Not on file  Social History Narrative  . Not on file   Social Determinants of Health   Financial Resource Strain:   . Difficulty of Paying Living Expenses:   Food Insecurity:   . Worried About Charity fundraiser in the Last Year:   . Arboriculturist in the Last Year:   Transportation Needs:   . Film/video editor (Medical):   [name] Lack of Transportation (Non-Medical):   Physical Activity:   . Days of Exercise per Week:   . Minutes of Exercise per Session:   Stress:   . Feeling of Stress :   Social Connections:   . Frequency of Communication with Friends and Family:   . Frequency of Social Gatherings with Friends and Family:   . Attends Religious Services:   . Active Member of Clubs or Organizations:   . Attends Archivist Meetings:   [name] Marital Status:   Intimate Partner Violence:   . Fear of Current or Ex-Partner:   . Emotionally Abused:   [name] Physically Abused:   . Sexually Abused:     Family History: Family History  Problem Relation Age of Onset  . Sickle cell anemia Mother   . Diabetes Father   . Stomach cancer Father   . Colon cancer Father   . CAD Neg Hx   . Esophageal cancer Neg Hx     . Rectal cancer Neg Hx      Review of Systems: All other systems reviewed and are otherwise negative except as noted above.  Physical Exam: Vitals:   04/09/20 1211  BP: 130/70  Pulse: 88  SpO2: 99%  Weight: 171 lb (77.6 kg)  Height: '5\' 11"'$  (1.803 m)     GEN- The patient is well appearing, alert and oriented x 3 today.   HEENT: normocephalic, atraumatic; sclera clear, conjunctiva pink; hearing intact; oropharynx  clear; neck supple  Lungs- Clear to ausculation bilaterally, normal work of breathing.  No wheezes, rales, rhonchi Heart- Regular rate and rhythm, no murmurs, rubs or gallops  GI- soft, non-tender, non-distended, bowel sounds present  Extremities- no clubbing, cyanosis, or edema  MS- no significant deformity or atrophy Skin- warm and dry, no rash or lesion; PPM pocket well healed Psych- euthymic mood, full affect Neuro- strength and sensation are intact  PPM Interrogation- reviewed in detail today,  See PACEART report  EKG:  EKG is not ordered today.  Recent Labs: 02/13/2020: Brain Natriuretic Peptide 234 03/19/2020: ALT 31; BUN 15; Creat 1.12; Hemoglobin 13.2; Magnesium 2.0; Platelets 206; Potassium 4.2; Sodium 142; TSH 0.36   Wt Readings from Last 3 Encounters:  04/09/20 171 lb (77.6 kg)  03/19/20 171 lb (77.6 kg)  03/09/20 168 lb 12.8 oz (76.6 kg)     Other studies Reviewed: Additional studies/ records that were reviewed today include: Echo 07/2017 shows LVEF 55-60%, Previous EP office notes, Previous remote checks, Most recent labwork.   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  Continue eliquis for CHA2DS2VASC of at least 7    2. HTN Stable on current medications; Known White coat syndrome component  3. Peripheral edema This resolved with lasix 80 mg daily, and has maintained on lasix 40 mg daily.  Continue lasix. BMET today.   4. Sinus tachycardia Stable.   Current medicines  are reviewed at length with the patient today.   The patient does not have concerns regarding his medicines.  The following changes were made today:  none  Labs/ tests ordered today include:  Orders Placed This Encounter  Procedures  . Basic Metabolic Panel (BMET)   Disposition:   Follow up with Dr. Caryl Comes in 6 months for his normal annual follow up.   Jacalyn Lefevre, PA-C  04/09/2020 12:38 PM  [place] [address] [phone] (office) [phone] (fax)

## 2020-04-09 NOTE — Patient Instructions (Addendum)
Medication Instructions:  none *If you need a refill on your cardiac medications before your next appointment, please call your [place] BMET If you have labs (blood work) drawn today and your tests are completely normal, you will receive your results only by: [name] MyChart Message (if you have MyChart) OR . A paper copy in the mail If you have any lab test that is abnormal or we need to change your treatment, we will call you to review the results.   Testing/Procedures: none   Follow-Up: January with Dr Caryl Comes At [facility], you and your health needs are our priority.  As part of our continuing mission to provide you with exceptional heart care, we have created designated Provider Care Teams.  These Care Teams include your primary Cardiologist (physician) and Advanced Practice Providers (APPs -  Physician Assistants and Nurse Practitioners) who all work together to provide you with the care you need, when you need it.   Other Instructions

## 2020-04-10 LAB — BASIC METABOLIC PANEL
BUN/Creatinine Ratio: 17 (ref 10–24)
BUN: 19 mg/dL (ref 8–27)
CO2: 24 mmol/L (ref 20–29)
Calcium: 9.4 mg/dL (ref 8.6–10.2)
Chloride: 104 mmol/L (ref 96–106)
Creatinine, Ser: 1.14 mg/dL (ref 0.76–1.27)
GFR calc Af Amer: 71 mL/min/{1.73_m2} (ref >59)
GFR calc non Af Amer: 61 mL/min/{1.73_m2} (ref >59)
Glucose: 97 mg/dL (ref 65–99)
Potassium: 4.5 mmol/L (ref 3.5–5.2)
Sodium: 142 mmol/L (ref 134–144)

## 2020-04-16 ENCOUNTER — Ambulatory Visit: Payer: Medicare Other | Attending: Adult Health

## 2020-04-16 ENCOUNTER — Other Ambulatory Visit: Payer: Self-pay

## 2020-04-16 DIAGNOSIS — R1312 Dysphagia, oropharyngeal phase: Secondary | ICD-10-CM | POA: Insufficient documentation

## 2020-04-16 NOTE — Therapy (Signed)
[facility] [address] [place], [place], [address] Phone: [phone]   Fax:  [phone]  Speech Language Pathology Treatment  Patient Details  Name: Earl [name] MRN: [id number] Date of Birth: [birth year] Referring Provider (SLP): Vicie Mutters, [place]   Encounter Date: 04/16/2020  End of Session - 04/16/20 1651    Visit Number  7    Number of Visits  17    Date for SLP Re-Evaluation  05/20/20    SLP Start Time  1450    SLP Stop Time   2376    SLP Time Calculation (min)  40 min       Past Medical History:  Diagnosis Date  . Adenomatous colon polyp   . Allergy   . Anal fissure   . Anxiety   . Arthritis   . BPH (benign prostatic hyperplasia)   . Cataract   . Diabetes mellitus ([place])   . GERD (gastroesophageal reflux disease)   . Hyperlipidemia   . Hypertension   . Hypothyroidism   . Other testicular hypofunction   . Stroke ([place])   . Type II or unspecified type diabetes mellitus without mention of complication, not stated as uncontrolled     Past Surgical History:  Procedure Laterality Date  . CIRCUMCISION N/A 06/11/2019   Procedure: CIRCUMCISION ADULT;  Surgeon: [name], MD;  Location: [facility];  Service: Urology;  Laterality: N/A;  . COLONOSCOPY    . INGUINAL HERNIA REPAIR Bilateral    1968 and 2004  . LOOP RECORDER INSERTION N/A 07/28/2017   Procedure: LOOP RECORDER INSERTION;  Surgeon: Deboraha Sprang, MD;  Location: [facility];  Service: Cardiovascular;  Laterality: N/A;  . TEE WITHOUT CARDIOVERSION N/A 07/28/2017   Procedure: TRANSESOPHAGEAL ECHOCARDIOGRAM (TEE);  Surgeon: Sueanne Margarita, MD;  Location: [facility] ENDOSCOPY;  Service: Cardiovascular;  Laterality: N/A;    There were no vitals filed for this visit.  Subjective Assessment - 04/16/20 1453    Subjective  Pt with outpatient MBSS last month. regular/thin recommended with chin tuck with all POs - see flowchart for details.     Currently in Pain?  No/denies            ADULT SLP TREATMENT - 04/16/20 1454      General Information   Behavior/Cognition  Alert;Cooperative;Pleasant mood      Treatment Provided   Treatment provided  Dysphagia      Dysphagia Treatment   Temperature Spikes Noted  No    Respiratory Status  Room air    Treatment Methods  Skilled observation;Therapeutic exercise    Patient observed directly with PO's  Yes    Type of PO's observed  Dysphagia 1 (puree);Thin liquids    Feeding  Able to feed self    Liquids provided via  Cup    Pharyngeal Phase Signs & Symptoms  --   none noted   Other treatment/comments  Pt indpendently used chin tuck with all POs today (x9 bites/sips). Followed precautions without SLP cues. SLP revamped pt's HEP today and added liquid hold with his effortful swallow, and added lingual manipulation exercise as well. Pt told rationale for each addition and verbalized understanding.  Pt voiced disappointmetn in not d/cing today however understood that SLP wanted to ensure correct production of HEP over time - pt agreed to one more session. He was strongly urged to cont HEP until 05-05-20 then complete x2/week and was provided rationale. He voiced understanding of this.  Assessment / Recommendations / Plan   Plan  --   likely d/c next session     Dysphagia Recommendations   Diet recommendations  Regular;Thin liquid    Liquids provided via  Cup   can use straight straw to facilitate chin down with liquids   Medication Administration  Whole meds with puree    Supervision  Patient able to self feed    Compensations  Slow rate;Small sips/bites;Clear throat intermittently    Postural Changes and/or Swallow Maneuvers  Chin tuck      Progression Toward Goals   Progression toward goals  Progressing toward goals       SLP Education - 04/16/20 1650    Education Details  changes in HEP and rationale, rationale for frequency of HEP    Person(s) Educated  Patient     Methods  Explanation;Demonstration;Verbal cues    Comprehension  Verbalized understanding;Returned demonstration;Verbal cues required       SLP Short Term Goals - 03/09/20 1721      SLP SHORT TERM GOAL #1   Title  pt will perform HEP with rare min A over two sessions    Status  Achieved      SLP [place] #2   Title  pt will follow aspiration precautions with rare min A over three sessions    Baseline  03-02-20, 03-09-20    Status  Partially Met      SLP SHORT TERM GOAL #3   Title  pt will tell SLP 3 overt s/sx aspiration PNA with modified independence    Time  1    Period  Weeks    Status  Deferred      SLP SHORT TERM GOAL #4   Title  pt will tell SLP rationale for completing HEP (strengthen muscles for swallowing, limit/eiliminate aspiration&penetration)    Status  Achieved       SLP Long Term Goals - 04/16/20 1655      SLP LONG TERM GOAL #1   Title  pt will follow aspiration precautions with modified independence in 4 therapy sessions    Baseline  03-09-20, 04-16-20    Time  4    Period  Weeks   or 17 sessions, for all LTGs   Status  On-going      SLP LONG TERM GOAL #2   Title  pt will complete HEP for dysphagia with modified independence over three sessions    Baseline  03-09-20, 04-16-20    Time  4    Period  Weeks    Status  On-going      SLP LONG TERM GOAL #3   Title  pt will tell SLP 3 overt s/s aspiration PNA with modified independence    Time  4    Period  Weeks    Status  On-going       Plan - 04/16/20 1651    Clinical Impression Statement  Pt presents today with mild oral dysphagia and mod pharyngeal dysphagia as ID'd on 03-20-20 modified (MBSS). SILENT aspiration continued with thin liquids WITHOUT chin tuck; penetration with chin tuck cleared with cued throat clear.  Regular diet/thin was recommended with slow rate, small bites/sips, effortful swallow, throat clear intermittently, and chin tuck with all POs. See "imaging" for more details. Pt  performed modified HEP (based on latest MBSS) with cues, and followed his swallow precautions with independence. Pt will benefit from 1-2 more (likely one more) skilled ST session addressing swallowing deficit due to deconditioning (  S/P CVA), in order to regularly assess adherence to swallow precautions and assess accuracy of HEP.    Speech Therapy Frequency  1x /week    Duration  2 weeks    Treatment/Interventions  Aspiration precaution training;Pharyngeal strengthening exercises;Diet toleration management by SLP;Trials of upgraded texture/liquids;Cueing hierarchy;Internal/external aids;Patient/family education;SLP instruction and feedback;Compensatory techniques    Potential to Achieve Goals  Good    Potential Considerations  Cooperation/participation level    SLP Home Exercise Plan  provided today    Consulted and Agree with Plan of Care  Patient       Patient will benefit from skilled therapeutic intervention in order to improve the following deficits and impairments:   Dysphagia, oropharyngeal phase - Plan: SLP plan of care cert/re-cert    Problem List Patient Active Problem List   Diagnosis Date Noted  . 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 Acquired thrombophilia ([place]) 09/09/2019  . Abdominal aortic atherosclerosis ([place]) 09/09/2019  . Late effect of cerebrovascular accident (CVA) 03/28/2019  . CKD stage 2 due to type 2 diabetes mellitus ([place]) 12/20/2017  . Chronic anticoagulation 09/04/2017  . Labile blood glucose   . Hypoalbuminemia due to protein-calorie malnutrition ([place])   . Ataxia, post-stroke   . History of CVA with residual deficit   . Hypothyroidism 09/13/2016  . Medicare annual wellness visit, subsequent 08/28/2015  . Vitamin D deficiency 04/29/2014  . Medication management 01/03/2014  . Type 2 diabetes mellitus ([place])   . BPH (benign prostatic hyperplasia)   . 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 Essential hypertension 11/07/2013  .  Hyperlipidemia associated with type 2 diabetes mellitus ([place]) 11/07/2013  . Paroxysmal atrial fibrillation ([place]) 09/18/2012    Sahith Nurse ,[place], [place]  04/16/2020, 4:59 PM  [facility] [address] [place], [place], [address] Phone: [phone]   Fax:  [phone]   Name: Earl [name] MRN: [id number] Date of Birth: July 29, 1941

## 2020-04-16 NOTE — Patient Instructions (Addendum)
SWALLOWING EXERCISES  DO THESE TWICE A DAY UNTIL June 14TH, THEN TWICE A WEEK AFTER THAT   1. Effortful Swallow / water hold - Take a SMALL SIP of water and hold it in your mouth for 5-7 seconds - Swallow HARD on a sip of water - Repeat 10-15 times, 2-3 times a day, and use whenever you eat or drink  2. [place] Swallow - swallow with your tongue sticking out - Stick tongue out past your teeth and gently bite tongue with your teeth - Swallow, while holding your tongue with your teeth - Repeat 10-15 times, 2-3 times a day *use a wet spoon if your mouth gets dry*  3. Breath Hold - Say "HUH!" loudly, then hold your breath for 3 seconds at your voice box - Repeat 20 times, 2-3 times a day        4.  Open mouth swallow           - Open your mouth and swallow your saliva           - Repeat 10 times, 2-3 times a day        5.  Move that "food"  - move that straw from side to side 20 times

## 2020-04-27 DIAGNOSIS — E1140 Type 2 diabetes mellitus with diabetic neuropathy, unspecified: Secondary | ICD-10-CM

## 2020-04-29 MED ORDER — GABAPENTIN 300 MG PO CAPS: ORAL_CAPSULE | ORAL | 2 refills | Status: DC

## 2020-04-29 NOTE — Telephone Encounter (Signed)
-----   Message from Elenor Quinones, [place] sent at 04/27/2020  2:44 PM EDT ----- Regarding: phone message Contact: [phone] Was told to call you when he was out of GABAPENTIN & that you were going to increase the dose as well.  Send Rx to [facility] pharmacy  You can send MyChart message to patient, I have informed the patient that you can/will send message of DOSE change as requested by patient's daughter.

## 2020-04-30 ENCOUNTER — Ambulatory Visit: Payer: Medicare Other | Attending: Adult Health

## 2020-04-30 ENCOUNTER — Other Ambulatory Visit: Payer: Self-pay

## 2020-04-30 DIAGNOSIS — R1312 Dysphagia, oropharyngeal phase: Secondary | ICD-10-CM | POA: Insufficient documentation

## 2020-04-30 NOTE — Therapy (Signed)
[facility] [address] [place], [place], [address] Phone: [phone]   Fax:  [phone]  Speech Language Pathology Treatment/Discharge Summary  Patient Details  Name: Earl [name] MRN: [id number] Date of Birth: 02-Nov-1941 Referring Provider (SLP): Vicie Mutters, [place]   Encounter Date: 04/30/2020   End of Session - 04/30/20 1254    Visit Number 8    Number of Visits 17    Date for SLP Re-Evaluation 05/20/20    SLP Start Time 6195    SLP Stop Time  1217    SLP Time Calculation (min) 30 min    Activity Tolerance Patient tolerated treatment well           Past Medical History:  Diagnosis Date  . Adenomatous colon polyp   . Allergy   . Anal fissure   . Anxiety   . Arthritis   . BPH (benign prostatic hyperplasia)   . Cataract   . Diabetes mellitus ([place])   . GERD (gastroesophageal reflux disease)   . Hyperlipidemia   . Hypertension   . Hypothyroidism   . Other testicular hypofunction   . Stroke ([place])   . Type II or unspecified type diabetes mellitus without mention of complication, not stated as uncontrolled     Past Surgical History:  Procedure Laterality Date  . CIRCUMCISION N/A 06/11/2019   Procedure: CIRCUMCISION ADULT;  Surgeon: [name], MD;  [facility];  Service: Urology;  Laterality: N/A;  . COLONOSCOPY    . INGUINAL HERNIA REPAIR Bilateral    1968 and 2004  . LOOP RECORDER INSERTION N/A 07/28/2017   Procedure: LOOP RECORDER INSERTION;  Surgeon: Deboraha Sprang, MD;  Location: [facility];  Service: Cardiovascular;  Laterality: N/A;  . TEE WITHOUT CARDIOVERSION N/A 07/28/2017   Procedure: TRANSESOPHAGEAL ECHOCARDIOGRAM (TEE);  Surgeon: Sueanne Margarita, MD;  Location: [facility] ENDOSCOPY;  Service: Cardiovascular;  Laterality: N/A;    There were no vitals filed for this visit.   Subjective Assessment - 04/30/20 1254    Subjective "Earl [name] I didn't do so well last week - I had  two funerals."    Currently in Pain? No/denies                 ADULT SLP TREATMENT - 04/30/20 1254      General Information   Behavior/Cognition Alert;Cooperative;Pleasant mood      Treatment Provided   Treatment provided Dysphagia      Dysphagia Treatment   Temperature Spikes Noted No    Respiratory Status Room air    Treatment Methods Skilled observation;Therapeutic exercise    Patient observed directly with PO's Yes    Type of PO's observed Dysphagia 1 (puree);Thin liquids    Feeding Able to feed self    Liquids provided via Cup    Pharyngeal Phase Signs & Symptoms --   no overt s/sx noted   Other treatment/comments Pt took POs and independently followed all precautions. With HEP, pt was indpendent, he held his tongue with his teeth for open mouth swallow but this technique is still functional for this exercise. He told SLP when to he could decr frequency of his HEP with modified indpendence. Pt agrees with d/c today.            SLP Education - 04/30/20 1253    Education Details s/sx aspiration PNA    Person(s) Educated Patient    Methods Explanation;Handout    Comprehension Verbalized understanding  SLP Short Term Goals - 03/09/20 1721      SLP SHORT TERM GOAL #1   Title pt will perform HEP with rare min A over two sessions    Status Achieved      SLP SHORT TERM GOAL #2   Title pt will follow aspiration precautions with rare min A over three sessions    Baseline 03-02-20, 03-09-20    Status Partially Met      SLP SHORT TERM GOAL #3   Title pt will tell SLP 3 overt s/sx aspiration PNA with modified independence    Time 1    Period Weeks    Status Deferred      SLP SHORT TERM GOAL #4   Title pt will tell SLP rationale for completing HEP (strengthen muscles for swallowing, limit/eiliminate aspiration&penetration)    Status Achieved            SLP Long Term Goals - 04/30/20 1301      SLP LONG TERM GOAL #1   Title pt will follow  aspiration precautions with modified independence in 4 therapy sessions    Baseline 03-09-20, 04-16-20, 04-30-20    Period --   or 17 sessions, for all LTGs   Status Partially Met      SLP LONG TERM GOAL #2   Title pt will complete HEP for dysphagia with modified independence over three sessions    Baseline 03-09-20, 04-16-20    Status Achieved      SLP LONG TERM GOAL #3   Title pt will tell SLP 3 overt s/s aspiration PNA with modified independence    Status Achieved            Plan - 04/30/20 1259    Clinical Impression Statement Pt presents today with mild oral dysphagia and mod pharyngeal dysphagia as ID'd on most recent modified (MBSS) on 03-20-20. SILENT aspiration continued with thin liquids WITHOUT chin tuck; penetration with chin tuck cleared with cued throat clear.  Regular diet/thin was recommended with slow rate, small bites/sips, effortful swallow, throat clear intermittently, and chin tuck with all POs. See "imaging" for more details. Pt performed HEP (based on latest MBSS) with independence, and followed his swallow precautions with independence. Pt is appropriate for d/c and agrees with this.    Speech Therapy Frequency --    Duration --    Treatment/Interventions Aspiration precaution training;Pharyngeal strengthening exercises;Diet toleration management by SLP;Trials of upgraded texture/liquids;Cueing hierarchy;Internal/external aids;Patient/family education;SLP instruction and feedback;Compensatory techniques    Potential to Achieve Goals Good    Potential Considerations Cooperation/participation level    SLP Home Exercise Plan provided today    Consulted and Agree with Plan of Care Patient           Patient will benefit from skilled therapeutic intervention in order to improve the following deficits and impairments:   Dysphagia, oropharyngeal phase   SPEECH THERAPY DISCHARGE SUMMARY  Visits from Start of Care: 5  Current functional level related to goals /  functional outcomes: See goal summary above. Pt partially met or met all LTGs. He has to cont to follow swallow precautions, and will cont his home exercises daily until 06-21-20, then will perform them x2/week to maintain his swallowing strength.   Remaining deficits: Oropharyngeal dysphagia   Education / Equipment: HEP procedure, swallow precautions   Plan: Patient agrees to discharge.  Patient goals were partially met. Patient is being discharged due to being pleased with the current functional level.  ?????  Problem List Patient Active Problem List   Diagnosis Date Noted  . 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 Acquired thrombophilia ([place]) 09/09/2019  . Abdominal aortic atherosclerosis ([place]) 09/09/2019  . Late effect of cerebrovascular accident (CVA) 03/28/2019  . CKD stage 2 due to type 2 diabetes mellitus ([place]) 12/20/2017  . Chronic anticoagulation 09/04/2017  . Labile blood glucose   . Hypoalbuminemia due to protein-calorie malnutrition ([place])   . Ataxia, post-stroke   . History of CVA with residual deficit   . Hypothyroidism 09/13/2016  . Medicare annual wellness visit, subsequent 08/28/2015  . Vitamin D deficiency 04/29/2014  . Medication management 01/03/2014  . Type 2 diabetes mellitus ([place])   . BPH (benign prostatic hyperplasia)   . 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 Essential hypertension 11/07/2013  . Hyperlipidemia associated with type 2 diabetes mellitus ([place]) 11/07/2013  . Paroxysmal atrial fibrillation ([place]) 09/18/2012    [place] ,[place], [place]  04/30/2020, 1:02 PM  [facility] [address] [place], [place], [address] Phone: [phone]   Fax:  [phone]   Name: Earl [name] MRN: [id number] Date of Birth: 1941-09-11

## 2020-04-30 NOTE — Patient Instructions (Addendum)
° °  Signs of Aspiration Pneumonia     Chest pain/tightness  Fever (can be low grade)  Cough  o With foul-smelling phlegm (sputum) o With sputum containing pus or blood o With greenish sputum  Fatigue   Shortness of breath   Wheezing   **IF YOU HAVE THESE SIGNS, CONTACT YOUR DOCTOR OR GO TO THE EMERGENCY DEPARTMENT OR URGENT CARE AS SOON AS POSSIBLE**   ==============================  Do the exercises twice a day, 6 days/week until May 21, 2020. Then do them once a day, twice A WEEK.

## 2020-05-04 ENCOUNTER — Ambulatory Visit (INDEPENDENT_AMBULATORY_CARE_PROVIDER_SITE_OTHER): Payer: Medicare Other | Admitting: *Deleted

## 2020-05-04 DIAGNOSIS — I480 Paroxysmal atrial fibrillation: Secondary | ICD-10-CM | POA: Diagnosis not present

## 2020-05-04 LAB — CUP PACEART REMOTE DEVICE CHECK
Date Time Interrogation Session: 20210613234025
Implantable Pulse Generator Implant Date: 20180907

## 2020-05-05 NOTE — Progress Notes (Signed)
[facility] Summary Report / Loop Recorder 

## 2020-05-11 ENCOUNTER — Ambulatory Visit: Payer: Medicare Other | Admitting: Adult Health

## 2020-05-12 ENCOUNTER — Encounter: Payer: Medicare Other | Attending: Physical Medicine & Rehabilitation | Admitting: Physical Medicine & Rehabilitation

## 2020-05-12 ENCOUNTER — Other Ambulatory Visit: Payer: Self-pay

## 2020-05-12 ENCOUNTER — Encounter: Payer: Self-pay | Admitting: Physical Medicine & Rehabilitation

## 2020-05-12 VITALS — BP 129/78 | HR 88 | Temp 97.7°F | Ht 71.0 in | Wt 171.8 lb

## 2020-05-12 DIAGNOSIS — I6930 Unspecified sequelae of cerebral infarction: Secondary | ICD-10-CM

## 2020-05-12 DIAGNOSIS — I69393 Ataxia following cerebral infarction: Secondary | ICD-10-CM

## 2020-05-12 DIAGNOSIS — R42 Dizziness and giddiness: Secondary | ICD-10-CM | POA: Diagnosis not present

## 2020-05-12 DIAGNOSIS — R269 Unspecified abnormalities of gait and mobility: Secondary | ICD-10-CM

## 2020-05-12 NOTE — Progress Notes (Addendum)
Subjective:    Patient ID: Earl [name], male    DOB: [birth year], 79 y.o.   MRN: [id number]   HPI Male with history of  HTN, questionable history PAF, T2DM presents for follow up for cerebellar stroke.   Last clinic visit 11/11/20.  Since that time, pt states he completed PT and SLP. He had 1 fall, trying to walk to fast at the [facility]. He is doing HEP.  Dizziness and orthostasis is improving.  He is following up with Neuro. He is drinking adequate fluids. Emotionally he states he is doing better, but still get emotional when talking about his wife.   Pain Inventory Average Pain 0 Pain Right Now 0 My pain is no pain  In the last 24 hours, has pain interfered with the following? General activity 0 Relation with others 0 Enjoyment of life 0 What TIME of day is your pain at its worst? no pain Sleep (in general) Fair  Pain is worse with: no pain Pain improves with: no pain Relief from Meds: no pain  Mobility walk with assistance use a cane ability to climb steps?  yes do you drive?  yes  Function retired  Neuro/Psych trouble walking dizziness depression  Prior Studies Any changes since last visit?  no CT/MRI  Physicians involved in your care Any changes since last visit?  no   Family History  Problem Relation Age of Onset  . Sickle cell anemia Mother   . Diabetes Father   . Stomach cancer Father   . Colon cancer Father   . CAD Neg Hx   . Esophageal cancer Neg Hx   . Rectal cancer Neg Hx    Social History   Socioeconomic History  . Marital status: Widowed    Spouse name: Not on file  . Number of children: 2  . Years of education: Not on file  . Highest education level: Not on file  Occupational History  . Occupation: Herbalist: RETIRED  Tobacco Use  . Smoking status: Never Smoker  . Smokeless tobacco: Never Used  Vaping Use  . Vaping Use: Never used  Substance and Sexual Activity  . Alcohol use: No  .  Drug use: No  . Sexual activity: Not on file  Other Topics Concern  . Not on file  Social History Narrative  . Not on file   Social Determinants of Health   Financial Resource Strain:   . Difficulty of Paying Living Expenses:   Food Insecurity:   . Worried About Charity fundraiser in the Last Year:   . Arboriculturist in the Last Year:   Transportation Needs:   . Film/video editor (Medical):   [name] Lack of Transportation (Non-Medical):   Physical Activity:   . Days of Exercise per Week:   . Minutes of Exercise per Session:   Stress:   . Feeling of Stress :   Social Connections:   . Frequency of Communication with Friends and Family:   . Frequency of Social Gatherings with Friends and Family:   . Attends Religious Services:   . Active Member of Clubs or Organizations:   . Attends Archivist Meetings:   [name] Marital Status:    Past Surgical History:  Procedure Laterality Date  . CIRCUMCISION N/A 06/11/2019   Procedure: CIRCUMCISION ADULT;  Surgeon: [name], MD;  [facility];  Service: Urology;  Laterality: N/A;  . COLONOSCOPY    . INGUINAL  HERNIA REPAIR Bilateral    1968 and 2004  . LOOP RECORDER INSERTION N/A 07/28/2017   Procedure: LOOP RECORDER INSERTION;  Surgeon: Deboraha Sprang, MD;  Location: [facility];  Service: Cardiovascular;  Laterality: N/A;  . TEE WITHOUT CARDIOVERSION N/A 07/28/2017   Procedure: TRANSESOPHAGEAL ECHOCARDIOGRAM (TEE);  Surgeon: Sueanne Margarita, MD;  Location: [facility] ENDOSCOPY;  Service: Cardiovascular;  Laterality: N/A;   Past Medical History:  Diagnosis Date  . Adenomatous colon polyp   . Allergy   . Anal fissure   . Anxiety   . Arthritis   . BPH (benign prostatic hyperplasia)   . Cataract   . Diabetes mellitus ([place])   . GERD (gastroesophageal reflux disease)   . Hyperlipidemia   . Hypertension   . Hypothyroidism   . Other testicular hypofunction   . Stroke ([name])   . Type II or unspecified type diabetes  mellitus without mention of complication, not stated as uncontrolled    BP 129/78   Pulse 88   Temp 97.7 F (36.5 C)   Ht 5\' 11"  (1.803 m)   Wt 171 lb 12.8 oz (77.9 kg)   SpO2 98%   BMI 23.96 kg/m   Opioid Risk Score:   Fall Risk Score:  `1  Depression screen PHQ 2/9  Depression screen [facility] 2/9 05/12/2020 12/15/2019 06/08/2019 03/05/2019 01/22/2019 11/26/2018 05/13/2018  Decreased Interest 3 0 0 0 0 0 0  Down, Depressed, Hopeless 0 0 0 0 0 0 0  PHQ - 2 Score 3 0 0 0 0 0 0  Altered sleeping 1 - - - - - -  Tired, decreased energy 1 - - - - - -  Change in appetite 0 - - - - - -  Feeling bad or failure about yourself  0 - - - - - -  Trouble concentrating 0 - - - - - -  Moving slowly or fidgety/restless 0 - - - - - -  Suicidal thoughts 0 - - - - - -  PHQ-9 Score 5 - - - - - -  Difficult doing work/chores - - - - - - -  Some recent data might be hidden    Review of Systems  Constitutional: Negative.   HENT: Negative.   Eyes: Negative.   Respiratory: Negative.   Cardiovascular: Negative.   Gastrointestinal: Negative.   Endocrine: Negative.   Genitourinary: Negative.   Musculoskeletal: Positive for gait problem.  Skin: Negative.   Allergic/Immunologic: Negative.   Neurological: Positive for weakness.       Dizziness when standing from a sitting position    Hematological: Negative.   Psychiatric/Behavioral: Negative.        Depression - wife died.       Objective:   Physical Exam  Constitutional: NAD. Vital signs reviewed. HENT: Normocephalic.  Atraumatic. Eyes: EOMI. No discharge. Cardiovascular: No JVD. Respiratory: Normal effort.  No stridor. GI: Non-distended. Skin: Warm and dry.  Intact. Psych: Normal mood. Normal affect. Musc: No edema in extremities.  No tenderness in extremities. Gait: Slow cadence and slightly wide based Neurological: Alert Mild ataxia RUE Speech clear  Motor: Grossly 5/5 throughout  Assessment & Plan:  [place] history of HTN,  questionable history PAF, T2DM presents for follow up for cerebellar stroke.   1.  Functional and mobility deficitssecondary to right cerebellar infarct             Encouraged HEP, Completed therapies  Cont follow up with Neurology             Encouraged slow positional changes, improving.   2. Gait abnormality  Continue HEP             Continue cane for safety  3. Orthostasis - improving             Continue slow positional changed             Cont appropriate hydration             Encouraged TEDS if necessary             Cont follow Cards

## 2020-06-08 ENCOUNTER — Ambulatory Visit (INDEPENDENT_AMBULATORY_CARE_PROVIDER_SITE_OTHER): Payer: Medicare Other | Admitting: *Deleted

## 2020-06-08 DIAGNOSIS — I480 Paroxysmal atrial fibrillation: Secondary | ICD-10-CM

## 2020-06-08 LAB — CUP PACEART REMOTE DEVICE CHECK
Date Time Interrogation Session: 20210718230629
Implantable Pulse Generator Implant Date: 20180907

## 2020-06-09 NOTE — Progress Notes (Signed)
[facility] Summary Report / Loop Recorder 

## 2020-07-10 DIAGNOSIS — H04123 Dry eye syndrome of bilateral lacrimal glands: Secondary | ICD-10-CM | POA: Diagnosis not present

## 2020-07-11 LAB — CUP PACEART REMOTE DEVICE CHECK
Date Time Interrogation Session: 20210818232723
Implantable Pulse Generator Implant Date: 20180907

## 2020-07-12 ENCOUNTER — Encounter: Payer: Self-pay | Admitting: Internal Medicine

## 2020-07-12 NOTE — Progress Notes (Signed)
Annual  Screening/Preventative Visit  & Comprehensive Evaluation & Examination     This very nice 79 y.o.  [facility] presents for a Screening /Preventative Visit & comprehensive evaluation and management of multiple medical co-morbidities.  Patient has been followed for HTN, HLD, T2_NIDDM  and Vitamin D Deficiency.     HTN predates circa 1980. Patient's BP has been controlled at home.  Today's BP is at goal - 124/62.  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  Also, he has hx pAfib & is on Eliquis. Patient denies any cardiac symptoms as chest pain, palpitations, shortness of breath, dizziness or ankle swelling.     Patient's hyperlipidemia is controlled with diet and Atorvastatin. Patient denies myalgias or other medication SE's. 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 Last A1c was not at goal:  Lab Results  Component Value Date   HGBA1C 6.8 (H) 03/19/2020        Patient was dx'd Hypothyroid in 2014 and initiated on Thyroid replacement & Goiter suppression.      Finally, patient has history of Vitamin D Deficiency ("30" / 2008)  and last vitamin D was at goal:  Lab Results  Component Value Date   VD25OH 65 03/19/2020    Current Outpatient Medications on File Prior to Visit  Medication Sig  . ALPRAZolam (XANAX) 1 MG tablet Take 1/2-1 tablet at Bedtime  ONLY if needed for Sleep &  limit to 5 days /week to avoid Addiction & Dementia  . apixaban (ELIQUIS) 5 MG TABS tablet Take 1 tablet 2 x /day to Prevent Blood Clots  . atorvastatin (LIPITOR) 40 MG tablet Take 1 tablet at bedtime for cholestaerol.  . Blood Glucose Monitoring Suppl (ONE TOUCH ULTRA  2) w/Device KIT Check blood sugar 1 time a day-DX-E11.9  . Cholecalciferol (VITAMIN D PO) Take 1 tablet by mouth daily.   . Cyanocobalamin (B-12) 50 MCG TABS Take 50 mcg by mouth daily.  . finasteride (PROSCAR) 5 MG tablet Take 1 tablet Daily for Prostate  . gabapentin (NEURONTIN) 300 MG capsule Take 1 to 2 capsules 1 hour before Bedtime for Diabetic Neuropathy Pains  . glipiZIDE (GLUCOTROL) 5 MG tablet Take 5 mg by mouth daily.  [name] glucose blood (ONETOUCH ULTRA) test strip Check blood sugar 1 time a day-DX-E11.9  . Lancets (ONETOUCH DELICA PLUS [id number]) MISC CHECK BLOOD SUGAR ONCE A DAY.  [name] Lancets Misc. (ONE TOUCH SURESOFT) MISC Check blood sugar 1 time a day-DX-E11.91  . levothyroxine (SYNTHROID) 50 MCG tablet Take 1 tablet daily on an empty stomach with only water for 30 minutes & no Antacid meds, Calcium or Magnesium for 4 hours & avoid Biotin  . Magnesium 250 MG TABS Take 250 mg by mouth daily.   . metFORMIN (GLUCOPHAGE-XR) 500 MG 24 hr tablet Take 500 mg by mouth 4 (four) times daily.  . Multiple Vitamin (MULTIVITAMIN) capsule Take 1 capsule by mouth daily.    . potassium chloride SA (KLOR-CON) 20 MEQ tablet Take one tablet by mouth daily with furosemide (lasix).  Take with food.  . sodium chloride (OCEAN) 0.65 % nasal spray Place  2 sprays into the nose as needed for congestion.  . triamcinolone ointment (KENALOG) 0.1 % APPLY TWICE DAILY.  . vitamin C (ASCORBIC ACID) 500 MG tablet Take 500 mg by mouth daily.    . carvedilol (COREG) 3.125 MG tablet Take 1 tablet (3.125 mg total) by mouth 2 (two) times daily. (Patient not taking: Reported on 07/13/2020)  . furosemide (LASIX) 40 MG tablet Take 1 tablet (40 mg total) by mouth daily.   No current facility-administered medications on file prior to visit.   Allergies  Allergen Reactions  . Penicillins Other (See Comments)   Past Medical History:  Diagnosis Date  . Adenomatous colon polyp   . Allergy   . Anal fissure   . Anxiety   .  Arthritis   . BPH (benign prostatic hyperplasia)   . Cataract   . Diabetes mellitus ([place])   . GERD (gastroesophageal reflux disease)   . Hyperlipidemia   . Hypertension   . Hypothyroidism   . Other testicular hypofunction   . Stroke ([place])   . Type II or unspecified type diabetes mellitus without mention of complication, not stated as uncontrolled    Health Maintenance  Topic Date Due  . URINE MICROALBUMIN  06/05/2020  . INFLUENZA VACCINE  06/21/2020  . HEMOGLOBIN A1C  09/18/2020  . OPHTHALMOLOGY EXAM  10/06/2020  . FOOT EXAM  07/12/2021  . TETANUS/TDAP  02/08/2025  . COVID-19 Vaccine  Completed  . Hepatitis C Screening  Completed  . PNA vac Low Risk Adult  Completed   Immunization History  Administered Date(s) Administered  . DT (Pediatric) 02/09/2015  . Influenza Split 08/07/2013  . Influenza, High Dose Seasonal PF 08/19/2014, 08/28/2015, 09/13/2016, 08/15/2017, 08/15/2018, 09/06/2019  . PFIZER SARS-COV-2 Vaccination 12/11/2019, 01/01/2020  . Pneumococcal Conjugate-13 06/10/2016  . Pneumococcal Polysaccharide-23 08/13/2008  . Td 11/22/2003   Last Colon - 09/07/2012 - Dr Deatra Ina - recc 5 yr f/u in Oct                     - 05/21/2019 - Dr Havery Moros - Recc Colon 3-5 years if indicated   Past Surgical History:  Procedure Laterality Date  . CIRCUMCISION N/A 06/11/2019   Procedure: CIRCUMCISION ADULT;  Surgeon: [name], MD;  Location: [facility];  Service: Urology;  Laterality: N/A;  . COLONOSCOPY    . INGUINAL HERNIA REPAIR Bilateral    1968 and 2004  . LOOP RECORDER INSERTION N/A 07/28/2017   Procedure: LOOP RECORDER INSERTION;  Surgeon: Deboraha Sprang, MD;  Location: [facility];  Service: Cardiovascular;  Laterality: N/A;  . TEE WITHOUT CARDIOVERSION N/A 07/28/2017   Procedure: TRANSESOPHAGEAL ECHOCARDIOGRAM (TEE);  Surgeon: Sueanne Margarita, MD;  Location: [facility];  Service: Cardiovascular;  Laterality: N/A;   Family History  Problem Relation Age of  Onset  . Sickle cell anemia Mother   . Diabetes Father   . Stomach cancer Father   . Colon cancer Father   . CAD Neg Hx   . Esophageal cancer Neg Hx   . Rectal cancer Neg Hx    Social History   Socioeconomic History  . Marital status: Widowed   08/30/ 2020  . Number of children: 2  Occupational History  . Occupation: Herbalist: RETIRED  Tobacco Use  . Smoking status: Never Smoker  . Smokeless tobacco: Never Used  Vaping Use  . Vaping Use: Never used  Substance and Sexual Activity  . Alcohol use: No  .  Drug use: No  . Sexual activity: Not on file   ROS Constitutional: Denies fever, chills, weight loss/gain, headaches, insomnia,  night sweats or change in appetite. Does c/o fatigue. Eyes: Denies redness, blurred vision, diplopia, discharge, itchy or watery eyes.  ENT: Denies discharge, congestion, post nasal drip, epistaxis, sore throat, earache, hearing loss, dental pain, Tinnitus, Vertigo, Sinus pain or snoring.  Cardio: Denies chest pain, palpitations, irregular heartbeat, syncope, dyspnea, diaphoresis, orthopnea, PND, claudication or edema Respiratory: denies cough, dyspnea, DOE, pleurisy, hoarseness, laryngitis or wheezing.  Gastrointestinal: Denies dysphagia, heartburn, reflux, water brash, pain, cramps, nausea, vomiting, bloating, diarrhea, constipation, hematemesis, melena, hematochezia, jaundice or hemorrhoids Genitourinary: Denies dysuria, frequency, discharge, hematuria or flank pain. Has urgency, nocturia x 2-3 & occasional hesitancy. Musculoskeletal: Denies arthralgia, myalgia, stiffness, Jt. Swelling, pain, limp or strain/sprain. Denies Falls. Skin: Denies puritis, rash, hives, warts, acne, eczema or change in skin lesion Neuro: No weakness, tremor, incoordination, spasms, paresthesia or pain Psychiatric: Denies confusion, memory loss or sensory loss. Denies Depression. Endocrine: Denies change in weight, skin, hair change,  nocturia, and paresthesia, diabetic polys, visual blurring or hyper / hypo glycemic episodes.  Heme/Lymph: No excessive bleeding, bruising or enlarged lymph nodes.  Physical Exam  BP 124/62   Pulse 72   Temp (!) 97.2 F (36.2 C)   Resp 16   Ht 5' 11.75" (1.822 m)   Wt 172 lb (78 kg)   BMI 23.49 kg/m   General Appearance: Well nourished and well groomed and in no apparent distress.  Eyes: PERRLA, EOMs, conjunctiva no swelling or erythema, normal fundi and vessels. Sinuses: No frontal/maxillary tenderness ENT/Mouth: EACs patent / TMs  nl. Nares clear without erythema, swelling, mucoid exudates. Oral hygiene is good. No erythema, swelling, or exudate. Tongue normal, non-obstructing. Tonsils not swollen or erythematous. Hearing normal.  Neck: Supple, thyroid not palpable. No bruits, nodes or JVD. Respiratory: Respiratory effort normal.  BS equal and clear bilateral without rales, rhonci, wheezing or stridor. Cardio: Heart sounds are normal with regular rate and rhythm and no murmurs, rubs or gallops. Peripheral pulses are normal and equal bilaterally without edema. No aortic or femoral bruits. Chest: symmetric with normal excursions and percussion.  Abdomen: Soft, with Nl bowel sounds. Nontender, no guarding, rebound, hernias, masses, or organomegaly.  Lymphatics: Non tender without lymphadenopathy.  Musculoskeletal: Full ROM all peripheral extremities, joint stability, 5/5 strength, and sl limping  Gait favoring the Rt. 7 supported with a cane. Skin: Warm and dry without rashes, lesions, cyanosis, clubbing or  ecchymosis.  Neuro: Cranial nerves intact. Sl Rt hyperreflexia.  Normal muscle tone, no cerebellar symptoms. Sensation intact to touch, vibratory and Monofilament to the toes bilaterally. Pysch: Alert and oriented X 3 with normal affect, insight and judgment appropriate.   Assessment and Plan  1. Annual Preventative/Screening Exam   2. Essential hypertension  - EKG 12-Lead -  [place], RETROPERITNL ABD,  LTD - CBC with Differential/Platelet - COMPLETE METABOLIC PANEL WITH GFR - Magnesium - TSH  3. Hyperlipidemia associated with type 2 diabetes mellitus ([place])  - EKG 12-Lead - [place], RETROPERITNL ABD,  LTD - Lipid panel - TSH  4. Type 2 diabetes mellitus with stage 2 chronic kidney disease, without long-term current use of insulin (HCC)  - EKG 12-Lead - [place], RETROPERITNL ABD,  LTD - HM DIABETES FOOT EXAM - LOW EXTREMITY NEUR EXAM DOCUM - Hemoglobin A1c - Insulin, random  5. Vitamin D deficiency  - VITAMIN D 25 Hydroxy  6. Paroxysmal atrial fibrillation (HCC)  -  EKG 12-Lead  7. Acquired thrombophilia ([place])  - CBC with Differential/Platelet  8. Hypothyroidism  - TSH  9. History of CVA with residual deficit   10. BPH with obstruction/lower urinary tract symptoms  - PSA  11. Prostate cancer screening  - PSA  12. Screening for ischemic heart disease  - EKG 12-Lead  13. FHx: heart disease  - EKG 12-Lead - [place], RETROPERITNL ABD,  LTD  14. Abdominal aortic atherosclerosis (HCC)  - [place], RETROPERITNL ABD,  LTD  15. Screening for AAA (aortic abdominal aneurysm)  - [place], RETROPERITNL ABD,  LTD  16. Medication management  - Urinalysis, Routine w reflex microscopic - Microalbumin / creatinine urine ratio - CBC with Differential/Platelet - COMPLETE METABOLIC PANEL WITH GFR - Magnesium - Lipid panel - TSH - Hemoglobin A1c - Insulin, random - VITAMIN D 25 Hydroxy        Patient was counseled in prudent diet, weight control to achieve/maintain BMI less than 25, BP monitoring, regular exercise and medications as discussed.  Discussed med effects and SE's. Routine screening labs and tests as requested with regular follow-up as recommended. Over 40 minutes of exam, counseling, chart review and high complex critical decision making was performed   Kirtland Bouchard, MD

## 2020-07-12 NOTE — Patient Instructions (Signed)
Due to recent changes in healthcare laws, you may see the results of your imaging and laboratory studies on MyChart before your provider has had a chance to review them.  We understand that in some cases there may be results that are confusing or concerning to you. Not all laboratory results come back in the same time frame and the provider may be waiting for multiple results in order to interpret others.  Please give us 48 hours in order for your provider to thoroughly review all the results before contacting the office for clarification of your results.  ° °+++++++++++++++++++++++++++++++ ° Vit D  & °Vit C 1,000 mg   °are recommended to help protect  °against the Covid-19 and other Corona viruses.  ° ° Also it's recommended  °to take  °Zinc 50 mg  °to help  °protect against the Covid-19   °and best place to get ° is also on [employer]  °and don't pay more than 6-8 cents /pill !  °================================ °Coronavirus (COVID-19) Are you at risk? ° °Are you at risk for the Coronavirus (COVID-19)? ° °To be considered HIGH RISK for Coronavirus (COVID-19), you have to meet the following criteria: ° °• Traveled to [place], [place], [place], [place] or [place]; or in the [place] to [place], [place], [place]  °• or [place]; and have fever, cough, and shortness of breath within the last 2 weeks of travel OR °• Been in close contact with a person diagnosed with COVID-19 within the last 2 weeks and have  °• fever, cough,and shortness of breath °•  °• IF YOU DO NOT MEET THESE CRITERIA, YOU ARE CONSIDERED LOW RISK FOR COVID-19. ° °What to do if you are HIGH RISK for COVID-19? ° °• If you are having a medical emergency, call 911. °• Seek medical care right away. Before you go to a doctor’s office, urgent care or emergency department, °•  call ahead and tell them about your recent travel, contact with someone diagnosed with COVID-19  °•  and your symptoms.  °• You should receive instructions from your  physician’s office regarding next steps of care.  °• When you arrive at healthcare provider, tell the healthcare staff immediately you have returned from  °• visiting [place], [place], [place], [place] or [place]; or traveled in the [place] to [place], [place],  °• [place] or [place] in the last two weeks or you have been in close contact with a person diagnosed with  °• COVID-19 in the last 2 weeks.   °• Tell the health care staff about your symptoms: fever, cough and shortness of breath. °• After you have been seen by a medical provider, you will be either: °o Tested for (COVID-19) and discharged home on quarantine except to seek medical care if  °o symptoms worsen, and asked to  °- Stay home and avoid contact with others until you get your results (4-5 days)  °- Avoid travel on public transportation if possible (such as bus, train, or airplane) or °o Sent to the Emergency Department by EMS for evaluation, COVID-19 testing  and  °o possible admission depending on your condition and test results. ° °What to do if you are LOW RISK for COVID-19? ° °Reduce your risk of any infection by using the same precautions used for avoiding the common cold or flu:  °• Wash your hands often with soap and warm water for at least 20 seconds.  If soap and water are not readily   available,  °• use an alcohol-based hand sanitizer with at least 60% alcohol.  °• If coughing or sneezing, cover your mouth and nose by coughing or sneezing into the elbow areas of your shirt or coat, °•  into a tissue or into your sleeve (not your hands). °• Avoid shaking hands with others and consider head nods or verbal greetings only. °• Avoid touching your eyes, nose, or mouth with unwashed hands.  °• Avoid close contact with people who are sick. °• Avoid places or events with large numbers of people in one location, like concerts or sporting events. °• Carefully consider travel plans you have or are making. °• If you are planning any travel  outside or inside the [place], visit the CDC’s Travelers’ Health webpage for the latest health notices. °• If you have some symptoms but not all symptoms, continue to monitor at home and seek medical attention  °• if your symptoms worsen. °• If you are having a medical emergency, call 911. °>>>>>>>>>>>>>>>>>>>>>>>>>>>>>>>>>>>>>>>>>>>>>>>>>>>>>>> °We Do NOT Approve of  °[facility] [place] °Soliciting Our Patients  °To Do Home Visits  °& °We Do NOT Approve of [employer] °> > > > > > > > > > > > > > > > > > > > > > > > > > > > > > > > > > >  > > > > ° ° °Preventive Care for Adults ° °A healthy lifestyle and preventive care can promote health and wellness. Preventive health guidelines for men include the following key practices: °· A routine yearly physical is a good way to check with your health care provider about your health and preventative screening. It is a chance to share any concerns and updates on your health and to receive a thorough exam. °· Visit your dentist for a routine exam and preventative care every 6 months. Brush your teeth twice a day and floss once a day. Good oral hygiene prevents tooth decay and gum disease. °· The frequency of eye exams is based on your age, health, family medical history, use of contact lenses, and other factors. Follow your health care provider's recommendations for frequency of eye exams. °· Eat a healthy diet. Foods such as vegetables, fruits, whole grains, low-fat dairy products, and lean protein foods contain the nutrients you need without too many calories. Decrease your intake of foods high in solid fats, added sugars, and salt. Eat the right amount of calories for you. Get information about a proper diet from your health care provider, if necessary. °· Regular physical exercise is one of the most important things you can do for your health. Most adults should get at least 150 minutes of moderate-intensity exercise (any activity that increases your heart  rate and causes you to sweat) each week. In addition, most adults need muscle-strengthening exercises on 2 or more days a week. °· Maintain a healthy weight. The body mass index (BMI) is a screening tool to identify possible weight problems. It provides an estimate of body fat based on height and weight. Your health care provider can find your BMI and can help you achieve or maintain a healthy weight. For adults 20 years and older: °¨ A BMI below 18.5 is considered underweight. °¨ A BMI of 18.5 to 24.9 is normal. °¨ A BMI of 25 to 29.9 is considered overweight. °¨ A BMI of 30 and above is considered obese. °· Maintain normal blood lipids and cholesterol levels by exercising and minimizing your intake of   saturated fat. Eat a balanced diet with plenty of fruit and vegetables. Blood tests for lipids and cholesterol should begin at age 20 and be repeated every 5 years. If your lipid or cholesterol levels are high, you are over 50, or you are at high risk for heart disease, you may need your cholesterol levels checked more frequently. Ongoing high lipid and cholesterol levels should be treated with medicines if diet and exercise are not working. °· If you smoke, find out from your health care provider how to quit. If you do not use tobacco, do not start. °· Lung cancer screening is recommended for adults aged 55-80 years who are at high risk for developing lung cancer because of a history of smoking. A yearly low-dose CT scan of the lungs is recommended for people who have at least a 30-pack-year history of smoking and are a current smoker or have quit within the past 15 years. A pack year of smoking is smoking an average of 1 pack of cigarettes a day for 1 year (for example: 1 pack a day for 30 years or 2 packs a day for 15 years). Yearly screening should continue until the smoker has stopped smoking for at least 15 years. Yearly screening should be stopped for people who develop a health problem that would prevent them  from having lung cancer treatment. °· If you choose to drink alcohol, do not have more than 2 drinks per day. One drink is considered to be 12 ounces (355 mL) of beer, 5 ounces (148 mL) of wine, or 1.5 ounces (44 mL) of liquor. °· Avoid use of street drugs. Do not share needles with anyone. Ask for help if you need support or instructions about stopping the use of drugs. °· High blood pressure causes heart disease and increases the risk of stroke. Your blood pressure should be checked at least every 1-2 years. Ongoing high blood pressure should be treated with medicines, if weight loss and exercise are not effective. °· If you are 45-79 years old, ask your health care provider if you should take aspirin to prevent heart disease. °· Diabetes screening involves taking a blood sample to check your fasting blood sugar level. Testing should be considered at a younger age or be carried out more frequently if you are overweight and have at least 1 risk factor for diabetes. °· Colorectal cancer can be detected and often prevented. Most routine colorectal cancer screening begins at the age of 50 and continues through age 75. However, your health care provider may recommend screening at an earlier age if you have risk factors for colon cancer. On a yearly basis, your health care provider may provide home test kits to check for hidden blood in the stool. Use of a small camera at the end of a tube to directly examine the colon (sigmoidoscopy or colonoscopy) can detect the earliest forms of colorectal cancer. Talk to your health care provider about this at age 50, when routine screening begins. Direct exam of the colon should be repeated every 5-10 years through age 75, unless early forms of precancerous polyps or small growths are found. °· Hepatitis C blood testing is recommended for all people born from 1945 through 1965 and any individual with known risks for hepatitis C. °· Screening for abdominal aortic aneurysm (AAA)  by  ultrasound is recommended for people who have history of high blood pressure or who are current or former smokers. °· Healthy men should  receive prostate-specific antigen (PSA)   blood tests as part of routine cancer screening. Talk with your health care provider about prostate cancer screening. °· Testicular cancer screening is  recommended for adult males. Screening includes self-exam, a health care provider exam, and other screening tests. Consult with your health care provider about any symptoms you have or any concerns you have about testicular cancer. °· Use sunscreen. Apply sunscreen liberally and repeatedly throughout the day. You should seek shade when your shadow is shorter than you. Protect yourself by wearing long sleeves, pants, a wide-brimmed hat, and sunglasses year round, whenever you are outdoors. °· Once a month, do a whole-body skin exam, using a mirror to look at the skin on your back. Tell your health care provider about new moles, moles that have irregular borders, moles that are larger than a pencil eraser, or moles that have changed in shape or color. °· Stay current with required vaccines (immunizations). °¨ Influenza vaccine. All adults should be immunized every year. °¨ Tetanus, diphtheria, and acellular pertussis (Td, Tdap) vaccine. An adult who has not previously received Tdap or who does not know his vaccine status should receive 1 dose of Tdap. This initial dose should be followed by tetanus and diphtheria toxoids (Td) booster doses every 10 years. Adults with an unknown or incomplete history of completing a 3-dose immunization series with Td-containing vaccines should begin or complete a primary immunization series including a Tdap dose. Adults should receive a Td booster every 10 years. °¨ Zoster vaccine. One dose is recommended for adults aged 60 years or older unless certain conditions are present. °¨  °¨ PREVNAR - Pneumococcal 13-valent conjugate (PCV13) vaccine. When indicated, a  person who is uncertain of his immunization history and has no record of immunization should receive the PCV13 vaccine. An adult aged 19 years or older who has certain medical conditions and has not been previously immunized should receive 1 dose of PCV13 vaccine. This PCV13 should be followed with a dose of pneumococcal polysaccharide (PPSV23) vaccine. The PPSV23 vaccine dose should be obtained 1 or more year(s)after the dose of PCV13 vaccine. An adult aged 19 years or older who has certain medical conditions and previously received 1 or more doses of PPSV23 vaccine should receive 1 dose of PCV13. The PCV13 vaccine dose should be obtained 1 or more years after the last PPSV23 vaccine dose. °¨  °¨ PNEUMOVAX - Pneumococcal polysaccharide (PPSV23) vaccine. When PCV13 is also indicated, PCV13 should be obtained first. All adults aged 65 years and older should be immunized. An adult younger than age 65 years who has certain medical conditions should be immunized. Any person who resides in a nursing home or long-term care facility should be immunized. An adult smoker should be immunized. People with an immunocompromised condition and certain other conditions should receive both PCV13 and PPSV23 vaccines. People with human immunodeficiency virus (HIV) infection should be immunized as soon as possible after diagnosis. Immunization during chemotherapy or radiation therapy should be avoided. Routine use of PPSV23 vaccine is not recommended for American Indians, [place] Natives, or people younger than 65 years unless there are medical conditions that require PPSV23 vaccine. When indicated, people who have unknown immunization and have no record of immunization should receive PPSV23 vaccine. One-time revaccination 5 years after the first dose of PPSV23 is recommended for people aged 19-64 years who have chronic kidney failure, nephrotic syndrome, asplenia, or immunocompromised conditions. People who received 1-2 doses of PPSV23  before age 65 years should receive another dose of PPSV23 vaccine   at age 65 years or later if at least 5 years have passed since the previous dose. Doses of PPSV23 are not needed for people immunized with PPSV23 at or after age 65 years. °¨  °¨ Hepatitis A vaccine. Adults who wish to be protected from this disease, have certain high-risk conditions, work with hepatitis A-infected animals, work in hepatitis A research labs, or travel to or work in countries with a high rate of hepatitis A should be immunized. Adults who were previously unvaccinated and who anticipate close contact with an international adoptee during the first 60 days after arrival in the [place] from a country with a high rate of hepatitis A should be immunized. °¨  °¨ Hepatitis B vaccine. Adults should be immunized if they wish to be protected from this disease, have certain high-risk conditions, may be exposed to blood or other infectious body fluids, are household contacts or sex partners of hepatitis B positive people, are clients or workers in certain care facilities, or travel to or work in countries with a high rate of hepatitis B. °¨  °Preventive Service / Frequency °·  °Ages 65 and over °· Blood pressure check. °· Lipid and cholesterol check. °· Lung cancer screening. / Every year if you are aged 55-80 years and have a 30-pack-year history of smoking and currently smoke or have quit within the past 15 years. Yearly screening is stopped once you have quit smoking for at least 15 years or develop a health problem that would prevent you from having lung cancer treatment. °· Fecal occult blood test (FOBT) of stool. You may not have to do this test if you get a colonoscopy every 10 years. °· Flexible sigmoidoscopy** or colonoscopy.** / Every 5 years for a flexible sigmoidoscopy or every 10 years for a colonoscopy beginning at age 50 and continuing until age 75. °· Hepatitis C blood test.** / For all people born from 1945 through 1965 and  any individual with known risks for hepatitis C. °· Abdominal aortic aneurysm (AAA) screening./ Screening current or former smokers or have Hypertension. °· Skin self-exam. / Monthly. °· Influenza vaccine. / Every year. °· Tetanus, diphtheria, and acellular pertussis (Tdap/Td) vaccine.** / 1 dose of Td every 10 years. ° °· Zoster vaccine.** / 1 dose for adults aged 60 years or older. ° °       Pneumococcal 13-valent conjugate (PCV13) vaccine.  ° °· Pneumococcal polysaccharide (PPSV23) vaccine.  °·  °· Hepatitis A vaccine.** / Consult your health care provider. °· Hepatitis B vaccine.** / Consult your health care provider. °Screening for abdominal aortic aneurysm (AAA)  by ultrasound is recommended for people who have history of high blood pressure or who are current or former smokers. °++++++++++ °Recommend Adult Low Dose Aspirin or  °coated  Aspirin 81 mg daily  °To reduce risk of Colon Cancer 40 %, ° Skin Cancer 26 % ,  °Malignant Melanoma 46% ° and  °Pancreatic cancer 60% °++++++++++++++++++++++ °Vitamin D goal ° is between 70-100.  °Please make sure that you are taking your Vitamin D as directed.  °It is very important as a natural anti-inflammatory  °helping hair, skin, and nails, as well as reducing stroke and heart attack risk.  °It helps your bones and helps with mood. °It also decreases numerous cancer risks so please take it as directed.  °Low Vit D is associated with a 200-300% higher risk for CANCER  °and 200-300% higher risk for HEART   ATTACK  &  STROKE.   °...................................... °  It is also associated with higher death rate at younger ages,  °autoimmune diseases like Rheumatoid arthritis, Lupus, Multiple Sclerosis.    °Also many other serious conditions, like depression, Alzheimer's °Dementia, infertility, muscle aches, fatigue, fibromyalgia - just to name a few. °++++++++++++++++++++++ °Recommend the book "The END of DIETING" by Dr Joel Fuhrman  °& the book "The END of DIABETES " by  Dr Joel Fuhrman °At [employer] - get book & Audio CD's  °  Being diabetic has a  300% increased risk for heart attack, stroke, cancer, and alzheimer- type vascular dementia. It is very important that you work harder with diet by avoiding all foods that are white. Avoid white rice (brown & wild rice is OK), white potatoes (sweetpotatoes in moderation is OK), White bread or wheat bread or anything made out of white flour like bagels, donuts, rolls, buns, biscuits, cakes, pastries, cookies, pizza crust, and pasta (made from white flour & egg whites) - vegetarian pasta or spinach or wheat pasta is OK. Multigrain breads like Arnold's or [employer], or multigrain sandwich thins or flatbreads.  Diet, exercise and weight loss can reverse and cure diabetes in the early stages.  Diet, exercise and weight loss is very important in the control and prevention of complications of diabetes which affects every system in your body, ie. Brain - dementia/stroke, eyes - glaucoma/blindness, heart - heart attack/heart failure, kidneys - dialysis, stomach - gastric paralysis, intestines - malabsorption, nerves - severe painful neuritis, circulation - gangrene & loss of a leg(s), and finally cancer and Alzheimers. ° °  I recommend avoid fried & greasy foods,  sweets/candy, white rice (brown or wild rice or Quinoa is OK), white potatoes (sweet potatoes are OK) - anything made from white flour - bagels, doughnuts, rolls, buns, biscuits,white and wheat breads, pizza crust and traditional pasta made of white flour & egg white(vegetarian pasta or spinach or wheat pasta is OK).  Multi-grain bread is OK - like multi-grain flat bread or sandwich thins. Avoid alcohol in excess. Exercise is also important. ° °  Eat all the vegetables you want - avoid meat, especially red meat and dairy - especially cheese.  Cheese is the most concentrated form of trans-fats which is the worst thing to clog up our arteries. Veggie cheese is OK which can be found  in the fresh produce section at [employer] or [employer] or [employer] ° °++++++++++++++++++++++ °DASH Eating Plan ° °DASH stands for "Dietary Approaches to Stop Hypertension."  ° °The DASH eating plan is a healthy eating plan that has been shown to reduce high blood pressure (hypertension). Additional health benefits may include reducing the risk of type 2 diabetes mellitus, heart disease, and stroke. The DASH eating plan may also help with weight loss. °WHAT DO I NEED TO KNOW ABOUT THE DASH EATING PLAN? °For the DASH eating plan, you will follow these general guidelines: °· Choose foods with a percent daily value for sodium of less than 5% (as listed on the food label). °· Use salt-free seasonings or herbs instead of table salt or sea salt. °· Check with your health care provider or pharmacist before using salt substitutes. °· Eat lower-sodium products, often labeled as "lower sodium" or "no salt added." °· Eat fresh foods. °· Eat more vegetables, fruits, and low-fat dairy products. °· Choose whole grains. Look for the word "whole" as the first word in the ingredient list. °· Choose fish  °· Limit sweets, desserts, sugars, and sugary drinks. °· Choose heart-healthy fats. °·   Eat veggie cheese  °· Eat more home-cooked food and less restaurant, buffet, and fast food. °· Limit fried foods. °· Cook foods using methods other than frying. °· Limit canned vegetables. If you do use them, rinse them well to decrease the sodium. °· When eating at a restaurant, ask that your food be prepared with less salt, or no salt if possible. °                  °   WHAT FOODS CAN I EAT? °Read Dr Joel Fuhrman's books on The End of Dieting & The End of Diabetes ° °Grains °Whole grain or whole wheat bread. Brown rice. Whole grain or whole wheat pasta. Quinoa, bulgur, and whole grain cereals. Low-sodium cereals. Corn or whole wheat flour tortillas. Whole grain cornbread. Whole grain crackers. Low-sodium crackers. ° °Vegetables °Fresh or  frozen vegetables (raw, steamed, roasted, or grilled). Low-sodium or reduced-sodium tomato and vegetable juices. Low-sodium or reduced-sodium tomato sauce and paste. Low-sodium or reduced-sodium canned vegetables.  ° °Fruits °All fresh, canned (in natural juice), or frozen fruits. ° °Protein Products ° All fish and seafood.  Dried beans, peas, or lentils. Unsalted nuts and seeds. Unsalted canned beans. ° °Dairy °Low-fat dairy products, such as skim or 1% milk, 2% or reduced-fat cheeses, low-fat ricotta or cottage cheese, or plain low-fat yogurt. Low-sodium or reduced-sodium cheeses. ° °Fats and Oils °Tub margarines without trans fats. Light or reduced-fat mayonnaise and salad dressings (reduced sodium). Avocado. Safflower, olive, or canola oils. Natural peanut or almond butter. ° °Other °Unsalted popcorn and pretzels. °The items listed above may not be a complete list of recommended foods or beverages. Contact your dietitian for more options. ° °++++++++++++++++++++ ° °WHAT FOODS ARE NOT RECOMMENDED? °Grains/ White flour or wheat flour °White bread. White pasta. White rice. Refined cornbread. Bagels and croissants. Crackers that contain trans fat. ° °Vegetables ° °Creamed or fried vegetables. Vegetables in a . Regular canned vegetables. Regular canned tomato sauce and paste. Regular tomato and vegetable juices. ° °Fruits °Dried fruits. Canned fruit in light or heavy syrup. Fruit juice. ° °Meat and Other Protein Products °Meat in general - RED meat & White meat.  Fatty cuts of meat. Ribs, chicken wings, all processed meats as bacon, sausage, bologna, salami, fatback, hot dogs, bratwurst and packaged luncheon meats. ° °Dairy °Whole or 2% milk, cream, half-and-half, and cream cheese. Whole-fat or sweetened yogurt. Full-fat cheeses or blue cheese. Non-dairy creamers and whipped toppings. Processed cheese, cheese spreads, or cheese curds. ° °Condiments °Onion and garlic salt, seasoned salt, table salt, and sea salt.  Canned and packaged gravies. Worcestershire sauce. Tartar sauce. Barbecue sauce. Teriyaki sauce. Soy sauce, including reduced sodium. Steak sauce. Fish sauce. Oyster sauce. Cocktail sauce. Horseradish. Ketchup and mustard. Meat flavorings and tenderizers. Bouillon cubes. Hot sauce. Tabasco sauce. Marinades. Taco seasonings. Relishes. ° °Fats and Oils °Butter, stick margarine, lard, shortening and bacon fat. Coconut, palm kernel, or palm oils. Regular salad dressings. ° °Pickles and olives. Salted popcorn and pretzels. ° °The items listed above may not be a complete list of foods and beverages to avoid. ° ° ° °

## 2020-07-13 ENCOUNTER — Ambulatory Visit (INDEPENDENT_AMBULATORY_CARE_PROVIDER_SITE_OTHER): Payer: Medicare Other | Admitting: *Deleted

## 2020-07-13 ENCOUNTER — Ambulatory Visit (INDEPENDENT_AMBULATORY_CARE_PROVIDER_SITE_OTHER): Payer: Medicare Other | Admitting: Internal Medicine

## 2020-07-13 ENCOUNTER — Other Ambulatory Visit: Payer: Self-pay

## 2020-07-13 ENCOUNTER — Encounter: Payer: Self-pay | Admitting: Internal Medicine

## 2020-07-13 VITALS — BP 124/62 | HR 72 | Temp 97.2°F | Resp 16 | Ht 71.75 in | Wt 172.0 lb

## 2020-07-13 DIAGNOSIS — Z125 Encounter for screening for malignant neoplasm of prostate: Secondary | ICD-10-CM

## 2020-07-13 DIAGNOSIS — Z8249 Family history of ischemic heart disease and other diseases of the circulatory system: Secondary | ICD-10-CM

## 2020-07-13 DIAGNOSIS — D6869 Other thrombophilia: Secondary | ICD-10-CM | POA: Diagnosis not present

## 2020-07-13 DIAGNOSIS — E039 Hypothyroidism, unspecified: Secondary | ICD-10-CM

## 2020-07-13 DIAGNOSIS — E1169 Type 2 diabetes mellitus with other specified complication: Secondary | ICD-10-CM | POA: Diagnosis not present

## 2020-07-13 DIAGNOSIS — I480 Paroxysmal atrial fibrillation: Secondary | ICD-10-CM

## 2020-07-13 DIAGNOSIS — Z136 Encounter for screening for cardiovascular disorders: Secondary | ICD-10-CM

## 2020-07-13 DIAGNOSIS — E1122 Type 2 diabetes mellitus with diabetic chronic kidney disease: Secondary | ICD-10-CM | POA: Diagnosis not present

## 2020-07-13 DIAGNOSIS — I6930 Unspecified sequelae of cerebral infarction: Secondary | ICD-10-CM

## 2020-07-13 DIAGNOSIS — N401 Enlarged prostate with lower urinary tract symptoms: Secondary | ICD-10-CM

## 2020-07-13 DIAGNOSIS — Z0000 Encounter for general adult medical examination without abnormal findings: Secondary | ICD-10-CM | POA: Diagnosis not present

## 2020-07-13 DIAGNOSIS — Z79899 Other long term (current) drug therapy: Secondary | ICD-10-CM

## 2020-07-13 DIAGNOSIS — N182 Chronic kidney disease, stage 2 (mild): Secondary | ICD-10-CM

## 2020-07-13 DIAGNOSIS — I10 Essential (primary) hypertension: Secondary | ICD-10-CM | POA: Diagnosis not present

## 2020-07-13 DIAGNOSIS — I700 Atherosclerosis of aorta: Secondary | ICD-10-CM | POA: Diagnosis not present

## 2020-07-13 DIAGNOSIS — N138 Other obstructive and reflux uropathy: Secondary | ICD-10-CM | POA: Diagnosis not present

## 2020-07-13 DIAGNOSIS — Z0001 Encounter for general adult medical examination with abnormal findings: Secondary | ICD-10-CM

## 2020-07-13 DIAGNOSIS — I639 Cerebral infarction, unspecified: Secondary | ICD-10-CM

## 2020-07-13 DIAGNOSIS — E559 Vitamin D deficiency, unspecified: Secondary | ICD-10-CM | POA: Diagnosis not present

## 2020-07-13 DIAGNOSIS — E785 Hyperlipidemia, unspecified: Secondary | ICD-10-CM

## 2020-07-14 LAB — CBC WITH DIFFERENTIAL/PLATELET
Absolute Monocytes: 459 cells/uL (ref 200–950)
Basophils Absolute: 39 cells/uL (ref 0–200)
Basophils Relative: 0.7 %
Eosinophils Absolute: 336 cells/uL (ref 15–500)
Eosinophils Relative: 6 %
HCT: 37.3 % — ABNORMAL LOW (ref 38.5–50.0)
Hemoglobin: 13.1 g/dL — ABNORMAL LOW (ref 13.2–17.1)
Lymphs Abs: 1882 cells/uL (ref 850–3900)
MCH: 30.2 pg (ref 27.0–33.0)
MCHC: 35.1 g/dL (ref 32.0–36.0)
MCV: 85.9 fL (ref 80.0–100.0)
MPV: 11.1 fL (ref 7.5–12.5)
Monocytes Relative: 8.2 %
Neutro Abs: 2884 cells/uL (ref 1500–7800)
Neutrophils Relative %: 51.5 %
Platelets: 171 10*3/uL (ref 140–400)
RBC: 4.34 10*6/uL (ref 4.20–5.80)
RDW: 12.7 % (ref 11.0–15.0)
Total Lymphocyte: 33.6 %
WBC: 5.6 10*3/uL (ref 3.8–10.8)

## 2020-07-14 LAB — URINALYSIS, ROUTINE W REFLEX MICROSCOPIC
Bilirubin Urine: NEGATIVE
Glucose, UA: NEGATIVE
Hgb urine dipstick: NEGATIVE
Ketones, ur: NEGATIVE
Leukocytes,Ua: NEGATIVE
Nitrite: NEGATIVE
Protein, ur: NEGATIVE
Specific Gravity, Urine: 1.009 (ref 1.001–1.03)
pH: 7 (ref 5.0–8.0)

## 2020-07-14 LAB — PSA: PSA: 0.3 ng/mL (ref <=4.0)

## 2020-07-14 LAB — MICROALBUMIN / CREATININE URINE RATIO
Creatinine, Urine: 43 mg/dL (ref 20–320)
Microalb Creat Ratio: 53 mcg/mg creat — ABNORMAL HIGH (ref <30)
Microalb, Ur: 2.3 mg/dL

## 2020-07-14 LAB — LIPID PANEL
Cholesterol: 117 mg/dL (ref <200)
HDL: 62 mg/dL (ref >=40)
LDL Cholesterol (Calc): 44 mg/dL (calc)
Non-HDL Cholesterol (Calc): 55 mg/dL (calc) (ref <130)
Total CHOL/HDL Ratio: 1.9 (calc) (ref <5.0)
Triglycerides: 39 mg/dL (ref <150)

## 2020-07-14 LAB — COMPLETE METABOLIC PANEL WITH GFR
AG Ratio: 2 (calc) (ref 1.0–2.5)
ALT: 25 U/L (ref 9–46)
AST: 19 U/L (ref 10–35)
Albumin: 4 g/dL (ref 3.6–5.1)
Alkaline phosphatase (APISO): 60 U/L (ref 35–144)
BUN: 21 mg/dL (ref 7–25)
CO2: 27 mmol/L (ref 20–32)
Calcium: 9.1 mg/dL (ref 8.6–10.3)
Chloride: 105 mmol/L (ref 98–110)
Creat: 1.17 mg/dL (ref 0.70–1.18)
GFR, Est African American: 68 mL/min/{1.73_m2} (ref >=60)
GFR, Est Non African American: 59 mL/min/{1.73_m2} — ABNORMAL LOW (ref >=60)
Globulin: 2 g/dL (calc) (ref 1.9–3.7)
Glucose, Bld: 162 mg/dL — ABNORMAL HIGH (ref 65–99)
Potassium: 4.1 mmol/L (ref 3.5–5.3)
Sodium: 141 mmol/L (ref 135–146)
Total Bilirubin: 0.7 mg/dL (ref 0.2–1.2)
Total Protein: 6 g/dL — ABNORMAL LOW (ref 6.1–8.1)

## 2020-07-14 LAB — HEMOGLOBIN A1C
Hgb A1c MFr Bld: 6.5 % of total Hgb — ABNORMAL HIGH (ref <5.7)
Mean Plasma Glucose: 140 (calc)
eAG (mmol/L): 7.7 (calc)

## 2020-07-14 LAB — VITAMIN D 25 HYDROXY (VIT D DEFICIENCY, FRACTURES): Vit D, 25-Hydroxy: 64 ng/mL (ref 30–100)

## 2020-07-14 LAB — TSH: TSH: 0.95 mIU/L (ref 0.40–4.50)

## 2020-07-14 LAB — MAGNESIUM: Magnesium: 1.9 mg/dL (ref 1.5–2.5)

## 2020-07-14 LAB — INSULIN, RANDOM: Insulin: 27.3 u[IU]/mL — ABNORMAL HIGH

## 2020-07-14 NOTE — Progress Notes (Signed)
============================================================ -   Test results slightly outside the reference range are not unusual. If there is anything important, I will review this with you,  otherwise it is considered normal test values.  If you have further questions,  please do not hesitate to contact me at the office or via My Chart.  ====================================================  -  PSA - very Low - Great  ====================================================  -  Total Chol = 117 and LDL Chol = 44 - Both  Excellent   - Very low risk for Heart Attack  / Stroke =============================================================  - A1c = 6.5% - better - Keep up the [place] work with diet ====================================================  -  Vitamin D = 64 - Excellent   ====================================================  -  All Else - CBC - Kidneys - Electrolytes - Liver - Magnesium & Thyroid    - all  Normal / OK ====================================================

## 2020-07-20 NOTE — Progress Notes (Signed)
[facility] Summary Report / Loop Recorder 

## 2020-07-30 ENCOUNTER — Telehealth: Payer: Self-pay

## 2020-07-30 NOTE — Telephone Encounter (Signed)
The pt wanted to know how long he have to be monitored for the loop recorder. I let him know that the loop recorder usually be monitored for 3 years. He wanted to know when the 3 years will be up. I told him when the battery reaches RRt. Once it reaches RRT the monitor will send an alert and the nurse will call him to let him know he can choose to have it removed.

## 2020-08-03 ENCOUNTER — Other Ambulatory Visit: Payer: Self-pay | Admitting: Adult Health Nurse Practitioner

## 2020-08-05 ENCOUNTER — Other Ambulatory Visit: Payer: Self-pay

## 2020-08-05 ENCOUNTER — Ambulatory Visit: Payer: Medicare Other | Admitting: Adult Health

## 2020-08-05 ENCOUNTER — Encounter: Payer: Self-pay | Admitting: Adult Health

## 2020-08-05 VITALS — BP 125/74 | HR 94 | Ht 71.75 in | Wt 174.0 lb

## 2020-08-05 DIAGNOSIS — I69398 Other sequelae of cerebral infarction: Secondary | ICD-10-CM

## 2020-08-05 DIAGNOSIS — I63441 Cerebral infarction due to embolism of right cerebellar artery: Secondary | ICD-10-CM | POA: Diagnosis not present

## 2020-08-05 DIAGNOSIS — E785 Hyperlipidemia, unspecified: Secondary | ICD-10-CM | POA: Diagnosis not present

## 2020-08-05 DIAGNOSIS — I480 Paroxysmal atrial fibrillation: Secondary | ICD-10-CM | POA: Diagnosis not present

## 2020-08-05 DIAGNOSIS — R2689 Other abnormalities of gait and mobility: Secondary | ICD-10-CM

## 2020-08-05 DIAGNOSIS — I10 Essential (primary) hypertension: Secondary | ICD-10-CM

## 2020-08-05 DIAGNOSIS — E1159 Type 2 diabetes mellitus with other circulatory complications: Secondary | ICD-10-CM | POA: Diagnosis not present

## 2020-08-05 NOTE — Progress Notes (Signed)
I agree with the above plan 

## 2020-08-05 NOTE — Patient Instructions (Signed)
Continue to use cane for ambulation to prevent falls with occasional imbalance and dizziness  Continue Eliquis (apixaban) daily  and atorvastatin  for secondary stroke prevention  Continue to follow up with PCP regarding cholesterol and blood pressure management  Maintain strict control of hypertension with blood pressure goal below 130/90 and cholesterol with LDL cholesterol (bad cholesterol) goal below 70 mg/dL.    Overall stable from stroke standpoint and recommend follow up on an as needed basis        Thank you for coming to see [place] at [facility]. I hope we have been able to provide you high quality care today.  You may receive a patient satisfaction survey over the next few weeks. We would appreciate your feedback and comments so that we may continue to improve ourselves and the health of our patients.

## 2020-08-05 NOTE — Progress Notes (Signed)
STROKE NEUROLOGY FOLLOW UP NOTE  NAME: Earl [name] DOB: 05/13/1941  REASON FOR VISIT: stroke follow up HISTORY FROM: pt and chart  Chief Complaint Chief Complaint  Patient presents with  . Follow-up    rm 2  . Cerebrovascular Accident    Pt is not have no new sx. Pt still gets head swimming at times.     HPI:  Today, 08/05/2020, Earl [name] returns for stroke follow-up unaccompanied   He reports occasional sensation of "swimmy head" - denies dizziness but per patient, more consistent with lightheadedness typically with position changes Residual stroke deficit of imbalance which has been improving per patient. He does report worsening at times typically with quick head movements or when around a large crowd. Continues ambulate with cane and denies any recent falls.  Denies new or worsening stroke/TIA symptoms  Remains on Eliquis 5 mg twice daily for secondary stroke prevention and atrial fibrillation without bleeding or bruising Continues on atorvastatin 40 mg daily without myalgias Blood pressure today 125/24  No further concerns today      History provided for reference purposes only Update 01/29/2020 JM: Mr. Nack is a 79 year old male who is being seen today, 01/29/2020, for stroke follow-up as well as follow up regarding prior concerns of worsening prior stroke symptoms. He has improved since prior visit as worsening symptoms felt due to deconditioning and increased stressors with underlying depression/anxiety.  He has been working with outpatient physical therapy and feels as though his balance has improved. He denies any dizziness or lightheadedness. Continues to ambulate with a cane when out of the house and denies recent falls. Overall has improved greatly since last visit. Continues on Eliquis 5 mg twice daily for secondary stroke prevention and atrial fibrillation and atorvastatin 40 mg daily for secondary stroke prevention without side effects.  Blood pressure  today 118/66. No further concerns at this time.   Update 10/30/2019: Earl [name] is a 79 year old male who is being seen today for stroke follow-up accompanied by his son.  He has missed prior follow-up visits and apparently son called office requesting today's visit due to concerns of fluctuating symptoms of prior stroke deficits such as worsening balance, lightheadedness, impairment of gait and decreased right hand dexterity.  He continues to ambulate with a cane and denies any recent falls.  Son does endorse occasional increased slurred speech when fatigued which has been chronic.  Unfortunately, patient's wife 70 years passed away in 30-Jul-2023 and son has noticed these fluctuations more prominent since that time with increased stress, depression and anxiety.  Son is concerned regarding reoccurring TIAs.  At prior visits, discussed dizziness concerns with evaluation by cardiologist who felt likely orthostatic in nature.  Patient endorses dizziness or "swoshy head sensation" when sitting to standing but typically subsides rapidly but at times will be accompanied by worsening balance, and lightheadedness and will resolve after resting.  He was started on alprazolam as needed for sleep/anxiety but is not on any other type of antidepressant.  Denies any additional strokelike symptoms and all above symptoms present after prior stroke.  Continues on Eliquis 5 mg twice daily and atorvastatin 40 mg daily for secondary stroke prevention without side effects.  Blood pressure today 138/80.  Glucose levels stable.  No further concerns at this time.      History Summary: Earl [name] is a 79 y.o. male with history of diabetes and hypertension who presented with nausea and dizziness. 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 CTA head and neck unremarkable. EF 55-60%. LDL 87 and A1c 6.6. Patient had history of questionable A. fib episode during colonoscopy in  the past, had 30 day cardiac event monitoring showed no A. Fib. TEE and remarkable and Loop recorder placed. Discharge to CIR with aspirin and Plavix as well as Lipitor.   History 09/04/17: During the interval time, the patient has been doing well. While he was in CIR, he was found to have afib/aflutter episode on loop recorder. He was put on eliquis $RemoveBe'5mg'YEUpOQkEz$  bid and DAPT discontinued. Pt doing well still at outpt PT/OT for right sided ataxia. BP good at home, today in clinic 148/89. He did not check finger stick at home but check urine glucose always negative. Continued on eliquis and lipitor without side effects.   UPDATE 02/08/18: Earl [name] is being seen today for a four-month follow-up and is accompanied by his wife.  He has been doing well overall with mild complaints of dizziness.  Dizziness does get worse with increasing fatigue.  Continues to take Eliquis without side effects of increasing bleeding or bruising.  Continues to take Lipitor without side effects of myalgias.  A1c 7.0 LDL 42 on 12/21/2017.  PCP managing diabetes and cholesterol.  Blood pressure at today's visit stable at 136/84.  Uses cane for long distance ambulation and to help with balance issues.  Continues to do at home PT exercises to help with balance.  Denies symptoms of sleep apnea such as snoring, daytime sleepiness, or frequent napping.  Does have a complaint of excessive salivation but denies dysphasia.  This is new since his stroke but has been improving. Denies new or worsening stroke/TIA symptoms.  Update 11/26/2018: Patient is being seen today per patient request due to concerns of increased dizziness, vertigo, head fullness, irregular heart rate and anxiety where patient is concerned for possible TIAs.  He is accompanied today by his son.  Patient states that he has been experiencing this dizziness since his stroke but son and family members have noticed worsening in the past few months.  He states he experiences dizziness while  sitting to standing, rapid eye or head movement, or attempting to concentrate/read for long period of time.  He states this can happen multiple times per day and lasts approximately 5 minutes.  He does continue to use his cane without any recent falls but son states at times when he is attempting to stand, he will have a difficult time balancing himself where it appears as though he may fall but fortunately has not done so at this time.  He was started on meclizine by his PCP but denies benefit.  He was also started on Xanax as he is not sleeping but has not noticed much benefit in his dizziness sensation.  He does state that his wife was recently diagnosed with cancer in 08/2018 for the third time and is currently undergoing radiation and chemo which has been placing a lot of increased stress outpatient and anxiety.  Son believes that the symptoms have worsened since this time.  He does continue to take Eliquis without side effects of bleeding or bruising.  He has not followed up with cardiology since hospital discharge.  He continues to take atorvastatin without side effects myalgias.  Blood pressure today stable at 148/80.  He does not consistently monitor at home but states SBP typically 130s when he is at appointments.  Patient denies chest pain, nausea, headache, visual changes or any neurological symptoms.  No further concerns at this time.    Virtual visit 02/25/2019: He was stable from a stroke standpoint with residual mild deficits of dizziness and balance difficulties.  Prior office visit on 11/26/2018 per patient request due to concerns of increased dizziness, vertigo, head fullness, irregular heart rate and anxiety and patient was concerned for possible TIAs.  PCP attempted to use meclizine and Xanax but denies benefit of increased dizziness sensation.  He did endorse increased stress during that time.  CT head obtained to ensure no new stroke which was unremarkable.  He did have follow-up visit since  this time with cardiology Dr. Caryl Comes with diagnosis of orthostatic hypertension which was likely contributing to his dizziness symptoms.  His endorses overall improvement of dizziness and balance since prior visit as he did participate in outpatient physical therapy. He does continue to use cane for assistance and denies any recent falls. He has not been monitoring blood pressure at home as he does follow closely with PCP. BP has been stable and follows with PCP for ongoing management.  He continues on atorvastatin 40 mg without side effects myalgias. No further concerns at this time. Denies new or worsening stroke/TIA symptoms.     REVIEW OF SYSTEMS: Full 14 system review of systems performed and notable only for those listed  in HPI above, all others are negative   The following represents the patient's updated allergies and side effects list: Allergies  Allergen Reactions  . Penicillins Other (See Comments)   Past Medical History:  Diagnosis Date  . Adenomatous colon polyp   . Allergy   . Anal fissure   . Anxiety   . Arthritis   . BPH (benign prostatic hyperplasia)   . Cataract   . Diabetes mellitus ([place])   . GERD (gastroesophageal reflux disease)   . Hyperlipidemia   . Hypertension   . Hypothyroidism   . Other testicular hypofunction   . Stroke ([place])   . Type II or unspecified type diabetes mellitus without mention of complication, not stated as uncontrolled    Current Outpatient Medications on File Prior to Visit  Medication Sig Dispense Refill  . ALPRAZolam (XANAX) 1 MG tablet Take 1/2-1 tablet at Bedtime  ONLY if needed for Sleep &  limit to 5 days /week to avoid Addiction & Dementia 60 tablet 0  . apixaban (ELIQUIS) 5 MG TABS tablet Take 1 tablet 2 x /day to Prevent Blood Clots 180 tablet 3  . atorvastatin (LIPITOR) 40 MG tablet Take 1 tablet at bedtime for cholestaerol. 90 tablet 3  . Blood Glucose Monitoring Suppl (ONE TOUCH ULTRA 2) w/Device KIT Check blood sugar 1 time a  day-DX-E11.9 1 kit 0  . carvedilol (COREG) 3.125 MG tablet Take 1 tablet (3.125 mg total) by mouth 2 (two) times daily. 60 tablet 11  . Cholecalciferol (VITAMIN D PO) Take 1 tablet by mouth daily.     . Cyanocobalamin (B-12) 50 MCG TABS Take 50 mcg by mouth daily.    . finasteride (PROSCAR) 5 MG tablet Take 1 tablet Daily for Prostate 90 tablet 3  . gabapentin (NEURONTIN) 300 MG capsule Take 1 to 2 capsules 1 hour before Bedtime for Diabetic Neuropathy Pains 180 capsule 2  . glipiZIDE (GLUCOTROL) 5 MG tablet Take 5 mg by mouth daily.    [name] glucose blood (ONETOUCH ULTRA) test strip Check blood sugar 1 time a day-DX-E11.9 100 each 2  . Lancets (ONETOUCH DELICA PLUS [id number]) MISC CHECK BLOOD SUGAR ONCE A DAY. 100 each  0  . Lancets Misc. (ONE TOUCH SURESOFT) MISC Check blood sugar 1 time a day-DX-E11.91 1 each 0  . levothyroxine (SYNTHROID) 50 MCG tablet Take 1 tablet daily on an empty stomach with only water for 30 minutes & no Antacid meds, Calcium or Magnesium for 4 hours & avoid Biotin 90 tablet 3  . Magnesium 250 MG TABS Take 250 mg by mouth daily.     . metFORMIN (GLUCOPHAGE-XR) 500 MG 24 hr tablet Take 500 mg by mouth 4 (four) times daily.    . Multiple Vitamin (MULTIVITAMIN) capsule Take 1 capsule by mouth daily.      . potassium chloride SA (KLOR-CON) 20 MEQ tablet Take one tablet by mouth daily with furosemide (lasix).  Take with food. 30 tablet 1  . sodium chloride (OCEAN) 0.65 % nasal spray Place 2 sprays into the nose as needed for congestion. 30 mL 3  . triamcinolone ointment (KENALOG) 0.1 % APPLY TWICE DAILY. 15 g 0  . vitamin C (ASCORBIC ACID) 500 MG tablet Take 500 mg by mouth daily.      . furosemide (LASIX) 40 MG tablet Take 1 tablet (40 mg total) by mouth daily. 90 tablet 3   No current facility-administered medications on file prior to visit.   Social History   Socioeconomic History  . Marital status: Widowed    Spouse name: Not on file  . Number of children: 2  . Years  of education: Not on file  . Highest education level: Not on file  Occupational History  . Occupation: Herbalist: RETIRED  Tobacco Use  . Smoking status: Never Smoker  . Smokeless tobacco: Never Used  Vaping Use  . Vaping Use: Never used  Substance and Sexual Activity  . Alcohol use: No  . Drug use: No  . Sexual activity: Not on file  Other Topics Concern  . Not on file  Social History Narrative  . Not on file   Social Determinants of Health   Financial Resource Strain:   . Difficulty of Paying Living Expenses: Not on file  Food Insecurity:   . Worried About Charity fundraiser in the Last Year: Not on file  . Ran Out of Food in the Last Year: Not on file  Transportation Needs:   . Lack of Transportation (Medical): Not on file  . Lack of Transportation (Non-Medical): Not on file  Physical Activity:   . Days of Exercise per Week: Not on file  . Minutes of Exercise per Session: Not on file  Stress:   . Feeling of Stress : Not on file  Social Connections:   . Frequency of Communication with Friends and Family: Not on file  . Frequency of Social Gatherings with Friends and Family: Not on file  . Attends Religious Services: Not on file  . Active Member of Clubs or Organizations: Not on file  . Attends Archivist Meetings: Not on file  . Marital Status: Not on file  Intimate Partner Violence:   . Fear of Current or Ex-Partner: Not on file  . Emotionally Abused: Not on file  . Physically Abused: Not on file  . Sexually Abused: Not on file      Physical examination  Today's Vitals   08/05/20 1025  BP: 125/74  Pulse: 94  Weight: 174 lb (78.9 kg)  Height: 5' 11.75" (1.822 m)   Body mass index is 23.76 kg/m.   General: well developed, well nourished,  pleasant elderly African-American male, seated, in no evident distress Head: head normocephalic and atraumatic.   Neck: supple with no carotid or supraclavicular  bruits Cardiovascular: regular rate and rhythm, no murmurs Musculoskeletal: no deformity Skin:  no rash/petichiae Vascular:  Normal pulses all extremities   Neurologic Exam Mental Status: Awake and fully alert.   Mild dysarthria.  Oriented to place and time. Recent and remote memory intact. Attention span, concentration and fund of knowledge appropriate. Mood and affect appropriate.  Cranial Nerves: Pupils equal, briskly reactive to light. Extraocular movements full without nystagmus. Visual fields full to confrontation. Hearing intact. Facial sensation intact. Face, tongue, palate moves normally and symmetrically.  Motor: Normal bulk and tone. Normal strength in all tested extremity muscles. Sensory: intact to touch, pinprick, position and vibratory sensation.  Coordination: Rapid alternating movements normal in all extremities. Finger-to-nose and heel-to-shin performed accurately on left side with mild ataxia noted on right side. Gait and Station: Arises from chair without difficulty. Stance is normal. Gait demonstrates slow cautious steps with mild steppage gait and use of cane and mild imbalance Reflexes: 1+ and symmetric. Toes downgoing.        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 Loop recorder placed at that time which has shown evidence of atrial fibrillation and therefore was started on Eliquis. Vascular risk factors include atrial fibrillation, DM, HLD and HTN.  Residual imbalance and dysarthria.    Plan:  -Continue Eliquis (apixaban) daily and lipitor for secondary stroke prevention -Discussed importance of maintaining HEP as recommended during therapy sessions and use of cane at all times for fall prevention -PCP for management of cholesterol, blood pressure and diabetes  -Cardiologist for atrial fibrillation and Eliquis management -Maintain strict control of hypertension with  blood pressure goal below 130/90, diabetes with hemoglobin A1c goal below 7.0% and cholesterol with LDL cholesterol (bad cholesterol) goal below 70 mg/dL. I also advised the patient to eat a healthy diet with plenty of whole grains, cereals, fruits and vegetables, exercise regularly and maintain ideal body weight.   Overall stable from stroke standpoint and recommend follow-up on an as-needed basis   I spent 20 minutes of face-to-face and non-face-to-face time with patient.  This included previsit chart review, lab review, study review, order entry, electronic health record documentation, patient education regarding prior stroke with residual deficits, importance of managing stroke risk factors as well as ongoing compliance with recommended medication management and answered all other questions to patient satisfaction   Frann Rider, [facility]  [facility] [address] [place], [place] [address]  Phone [phone] Fax [phone]

## 2020-08-06 ENCOUNTER — Other Ambulatory Visit: Payer: Self-pay | Admitting: Internal Medicine

## 2020-08-16 LAB — CUP PACEART REMOTE DEVICE CHECK
Date Time Interrogation Session: 20210918232225
Implantable Pulse Generator Implant Date: 20180907

## 2020-08-17 ENCOUNTER — Ambulatory Visit (INDEPENDENT_AMBULATORY_CARE_PROVIDER_SITE_OTHER): Payer: Medicare Other | Admitting: Emergency Medicine

## 2020-08-17 DIAGNOSIS — I6930 Unspecified sequelae of cerebral infarction: Secondary | ICD-10-CM

## 2020-08-20 NOTE — Progress Notes (Signed)
[facility] Summary Report / Loop Recorder 

## 2020-09-01 ENCOUNTER — Other Ambulatory Visit: Payer: Self-pay | Admitting: Internal Medicine

## 2020-09-01 DIAGNOSIS — I480 Paroxysmal atrial fibrillation: Secondary | ICD-10-CM

## 2020-09-01 MED ORDER — APIXABAN 5 MG PO TABS: ORAL_TABLET | ORAL | 0 refills | Status: DC

## 2020-09-11 ENCOUNTER — Ambulatory Visit (INDEPENDENT_AMBULATORY_CARE_PROVIDER_SITE_OTHER): Payer: Medicare Other

## 2020-09-11 DIAGNOSIS — I639 Cerebral infarction, unspecified: Secondary | ICD-10-CM

## 2020-09-12 LAB — CUP PACEART REMOTE DEVICE CHECK
Date Time Interrogation Session: 20211021232328
Implantable Pulse Generator Implant Date: 20180907

## 2020-09-16 NOTE — Progress Notes (Signed)
[facility] Summary Report / Loop Recorder 

## 2020-09-30 ENCOUNTER — Other Ambulatory Visit: Payer: Self-pay | Admitting: Internal Medicine

## 2020-10-08 DIAGNOSIS — H52203 Unspecified astigmatism, bilateral: Secondary | ICD-10-CM | POA: Diagnosis not present

## 2020-10-08 DIAGNOSIS — H5203 Hypermetropia, bilateral: Secondary | ICD-10-CM | POA: Diagnosis not present

## 2020-10-08 DIAGNOSIS — E119 Type 2 diabetes mellitus without complications: Secondary | ICD-10-CM | POA: Diagnosis not present

## 2020-10-08 LAB — HM DIABETES EYE EXAM

## 2020-10-09 ENCOUNTER — Encounter: Payer: Self-pay | Admitting: Internal Medicine

## 2020-10-13 ENCOUNTER — Other Ambulatory Visit: Payer: Self-pay | Admitting: Internal Medicine

## 2020-10-14 ENCOUNTER — Ambulatory Visit (INDEPENDENT_AMBULATORY_CARE_PROVIDER_SITE_OTHER): Payer: Medicare Other

## 2020-10-14 DIAGNOSIS — I639 Cerebral infarction, unspecified: Secondary | ICD-10-CM | POA: Diagnosis not present

## 2020-10-14 NOTE — Progress Notes (Signed)
3 MONTH FOLLOW UP Assessment:   Abdominal aortic atherosclerosis (HCC) Control blood pressure, cholesterol, glucose, increase exercise.   Essential hypertension Off of lisinopril and coreg due to hypotension; currently well controlled, edema improved with furosemide; monitor -     CBC with Differential/Platelet -     COMPLETE METABOLIC PANEL WITH GFR -     TSH - continue medications, DASH diet, exercise and monitor at home. Call if greater than 130/80.   Paroxysmal atrial fibrillation (HCC)/ acquired thrombophilia Continue medications, no bleeding concerns, rate controlled, follow up cardio  Hyperlipidemia associated with type 2 diabetes mellitus (HCC) -     Lipid panel check lipids decrease fatty foods increase activity.   Type 2 diabetes mellitus with stage 2 chronic kidney disease, without long-term current use of insulin (HCC) -     Hemoglobin A1c Discussed general issues about diabetes pathophysiology and management., Educational material distributed., Suggested low cholesterol diet., Encouraged aerobic exercise., Discussed foot care., Reminded to get yearly retinal exam.  CKD stage 2 due to type 2 diabetes mellitus ([place]) -     Hemoglobin A1c Discussed general issues about diabetes pathophysiology and management., Educational material distributed., Suggested low cholesterol diet., Encouraged aerobic exercise., Discussed foot care., Reminded to get yearly retinal exam.  Hypoalbuminemia due to protein-calorie malnutrition ([place]) High protein diet encouraged; monitor  -     COMPLETE METABOLIC PANEL WITH GFR  Hypothyroidism, unspecified type -     TSH Hypothyroidism-check TSH level, continue medications the same, reminded to take on an empty stomach 30-71mins before food.   MGUS (monoclonal gammopathy of unknown significance) Continue to monitor; Dr. 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, post-stroke Continue to walk with a cane, no falls, monitor  Neurology is also following   Over 30 minutes of exam, counseling, chart review, and critical decision making was performed  Future Appointments  Date Time Provider [place]  11/16/2020  7:10 AM [facility] DEVICE REMOTES [facility]  12/21/2020  7:10 AM [facility] DEVICE REMOTES [facility]  12/28/2020  1:15 PM Baldwin [place], PA-C [facility]  01/01/2021 10:00 AM [facility] None  01/08/2021  9:30 AM Wyatt Portela, MD [facility] None  01/25/2021  7:10 AM [facility] DEVICE REMOTES [facility]  01/29/2021 10:30 AM [name], MD [facility] None  03/01/2021  7:10 AM [facility] DEVICE REMOTES [facility]  03/25/2021 11:00 AM Garnet Sierras, NP [facility] None  04/05/2021  7:10 AM [facility] DEVICE REMOTES [facility]  05/10/2021  7:10 AM [facility] DEVICE REMOTES [facility]  05/13/2021 11:20 AM Jamse Arn, MD [facility]  06/14/2021  7:10 AM [facility] DEVICE REMOTES [facility]  07/19/2021  7:10 AM [facility] DEVICE REMOTES [facility]  07/23/2021 10:00 AM [name], MD [facility] None  08/23/2021  7:10 AM [facility] DEVICE REMOTES [facility]  09/27/2021  7:10 AM [facility] DEVICE REMOTES [facility]  11/01/2021  7:10 AM [facility] DEVICE REMOTES [facility]    Subjective:  Earl [name] is a 79 y.o. male who presents for  3 month follow up for HTN, hyperlipidemia DMII, CKD, atrial fibrillation, BPH, and vitamin D Def.    Wife [name] passed earlier this year, had been married 67 years. His daughter [name] stays with him twice a week, Son also checks in on him. He is doing  better. He was prescribed xanax  when wife passed, hasn't used in 7-8 months and ready to d/c.   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  He uses a cane for gait stability for right ataxia sp CVA. He did follow with speeth therapy with sigificant improvement, recent with mild intermittent dysphagia only.  He is driving locally to appointments, no highway/no night driving.   Initially unsure of correct medications, called back to verify at home. Using pill box. Believes good compliance with medications at this time.   BMI is Body mass index is 23.63 kg/m., he is working on diet and exercise. Wt Readings from Last 3 Encounters:  10/19/20 173 lb (78.5 kg)  08/05/20 174 lb (78.9 kg)  07/13/20 172 lb (78 kg)   He also follows with Dr Alen Blew for MGUS.   His blood pressure has been controlled at home, today their BP is BP: 124/76 He does not workout. He denies chest pain, shortness of breath, does endorse episodes of feeling "woozy" with rapid position changes. Has seen neuro and   He has p. A. Fib, on elequis 5 mg BID, also prescribed carvedilol 3.125 mg BID but states hasn't been taking this, BPs have been in good range per home log - 120/130s/70s, etc, does ? take furosemide 40 mg daily but he is unsure, will verify meds at home and follow up. Wearing compression hose for edema, recently improved. Follows with cardiology Dr. Caryl Comes.   Last ECHO 07/2017 Wall thickness was increased in a pattern of  moderate LVH. Systolic function was normal. The estimated  ejection fraction was in the range of 55% to 60%. Wall motion was  normal; there were no regional wall motion abnormalities  He has abdominal aortic atherosclerosis per CT 2016.   He is on cholesterol medication (atorvastatin 40 mg daily) and denies myalgias. His cholesterol is at goal. The cholesterol last visit was:   Lab Results  Component Value Date   CHOL 117 07/13/2020   HDL 62 07/13/2020   LDLCALC 44  07/13/2020   TRIG 39 07/13/2020   CHOLHDL 1.9 07/13/2020   He has not been working on diet and exercise for diabetes with CKD, and denies hyperglycemia, hypoglycemia , nausea, polydipsia and polyuria.  He is on gabapentin for neuropathy, only on 2 pills at night.  He takes metformin 2000 mg daily, glipizide 5 mg daily,  Fasting ranges 110-150s, typically 120-140s average per patient  Last A1C in the office was:  Lab Results  Component Value Date   HGBA1C 6.5 (H) 07/13/2020    CKD II associated with T2DM, monitoring, off of lisinopril due to hypotension Lab Results  Component Value Date   GFRAA 68 07/13/2020   Patient is on Vitamin D supplement.   Lab Results  Component Value Date   VD25OH 61 07/13/2020     He is on thyroid medication. His medication was not changed last visit.   Lab Results  Component Value Date   TSH 0.95 07/13/2020  .   Medication Review:  Current Outpatient Medications (Endocrine & Metabolic):  .  glipiZIDE (GLUCOTROL) 5 MG tablet, Take 5 mg by mouth daily. [name]  levothyroxine (SYNTHROID) 50 MCG tablet, Take 1 tablet daily on an empty stomach with only water for 30 minutes & no Antacid meds, Calcium or Magnesium for 4 hours & avoid Biotin .  metFORMIN (GLUCOPHAGE-XR) 500 MG 24 hr tablet, TAKE 2 TABLETS BY MOUTH 2  TIMES DAILY WITH MEALS FOR  DIABETES  Current Outpatient Medications (Cardiovascular):  .  atorvastatin (LIPITOR) 40 MG tablet, Take 1 tablet at bedtime for cholestaerol. .  carvedilol (COREG) 3.125 MG tablet, Take 1 tablet (3.125 mg total) by mouth 2 (two) times daily. .  furosemide (LASIX) 40 MG tablet, Take 1 tablet (40 mg total) by mouth daily.  Current Outpatient Medications (Respiratory):  .  sodium chloride (OCEAN) 0.65 % nasal spray, Place 2 sprays into the nose as needed for congestion.   Current Outpatient Medications (Hematological):  .  apixaban (ELIQUIS) 5 MG TABS tablet, Take     1 tablet     2 x  /day      to prevent Blood  Clots .  Cyanocobalamin (B-12) 50 MCG TABS, Take 50 mcg by mouth daily.  Current Outpatient Medications (Other):  .  Blood Glucose Monitoring Suppl (ONE TOUCH ULTRA 2) w/Device KIT, Check blood sugar 1 time a day-DX-E11.9 .  Cholecalciferol (VITAMIN D PO), Take 1 tablet by mouth daily.  .  finasteride (PROSCAR) 5 MG tablet, TAKE 1 TABLET BY MOUTH  DAILY FOR PROSTATE .  gabapentin (NEURONTIN) 300 MG capsule, Take 1 to 2 capsules 1 hour before Bedtime for Diabetic Neuropathy Pains .  glucose blood (ONETOUCH ULTRA) test strip, CHECK BLOOD SUGAR ONCE A DAY. [name]  Lancets (ONETOUCH DELICA PLUS LANCET30G) MISC, CHECK BLOOD SUGAR ONCE A DAY. [name]  Lancets Misc. (ONE TOUCH SURESOFT) MISC, Check blood sugar 1 time a day-DX-E11.91 .  Magnesium 250 MG TABS, Take 250 mg by mouth daily.  .  Multiple Vitamin (MULTIVITAMIN) capsule, Take 1 capsule by mouth daily.   .  potassium chloride SA (KLOR-CON) 20 MEQ tablet, Take one tablet by mouth daily with furosemide (lasix).  Take with food. .  triamcinolone ointment (KENALOG) 0.1 %, APPLY TWICE DAILY. .  vitamin C (ASCORBIC ACID) 500 MG tablet, Take 500 mg by mouth daily.    Allergies: Allergies  Allergen Reactions  . Penicillins Other (See Comments)    Current Problems (verified) has Paroxysmal atrial fibrillation (HCC); MGUS (monoclonal gammopathy of unknown significance); Essential hypertension; Hyperlipidemia associated with type 2 diabetes mellitus (HCC); Type 2 diabetes mellitus (HCC); BPH (benign prostatic hyperplasia); Medication management; Vitamin D deficiency; Hypothyroidism; History of CVA with residual deficit; Hypoalbuminemia due to protein-calorie malnutrition (HCC); Ataxia, post-stroke; Labile blood glucose; Chronic anticoagulation; CKD stage 2 due to type 2 diabetes mellitus (HCC); Late effect of cerebrovascular accident (CVA); Acquired thrombophilia (HCC); Abdominal aortic atherosclerosis (HCC); Neurologic gait disorder; and Dizziness on their  problem list.   Surgical: He  has a past surgical history that includes Inguinal hernia repair (Bilateral); TEE without cardioversion (N/A, 07/28/2017); LOOP RECORDER INSERTION (N/A, 07/28/2017); Colonoscopy; and Circumcision (N/A, 06/11/2019). Family His family history includes Colon cancer in his father; Diabetes in his father; Sickle cell anemia in his mother; Stomach cancer in his father. Social history  He reports that he has never smoked. He has never used smokeless tobacco. He reports that he does not drink alcohol and does not use drugs.   Review of Systems  Constitutional: Negative for malaise/fatigue and weight loss.  HENT: Negative for hearing loss and tinnitus.   Eyes: Negative for blurred vision and double vision.  Respiratory: Negative for cough, sputum production, shortness of breath and wheezing.   Cardiovascular: Negative for chest pain, palpitations, orthopnea, claudication, leg swelling and PND.  Gastrointestinal: Negative for abdominal pain, blood in stool, constipation, diarrhea, heartburn, melena, nausea and vomiting.  Genitourinary: Negative.  Musculoskeletal: Negative for falls, joint pain and myalgias.  Skin: Negative for rash.  Neurological: Negative for dizziness, tingling, sensory change, weakness and headaches.  Endo/Heme/Allergies: Negative for polydipsia.  Psychiatric/Behavioral: Negative.  Negative for depression, memory loss, substance abuse and suicidal ideas. The patient is not nervous/anxious and does not have insomnia.   All other systems reviewed and are negative.    Objective:   Today's Vitals   10/19/20 1120  BP: 124/76  Pulse: 93  Temp: (!) 96.4 F (35.8 C)  SpO2: 99%  Weight: 173 lb (78.5 kg)   Body mass index is 23.63 kg/m.  General Appearance: Well nourished, in no apparent distress. Eyes: PERRLA, EOMs, conjunctiva no swelling or erythema Sinuses: No Frontal/maxillary tenderness ENT/Mouth: Ext aud canals clear, TMs without erythema,  bulging. No erythema, swelling, or exudate on post pharynx.  Tonsils not swollen or erythematous. Hearing normal.  Neck: Supple, thyroid normal.  Respiratory: Respiratory effort normal, BS equal bilaterally without rales, rhonchi, wheezing or stridor.  Cardio: RRR with no MRGs. Brisk peripheral pulses with scant edema, wearing compression hose. Abdomen: Soft, + BS.  Non tender, no guarding, rebound, hernias, masses. Lymphatics: Non tender without lymphadenopathy.  Musculoskeletal: Full ROM, 5/5 strength, slow steady gait with cane Skin: Warm, dry without rashes, lesions, ecchymosis.  Neuro: Cranial nerves intact. No cerebellar symptoms. Sensation intact to monofilament in bil feet.  Psych: Awake and oriented X 3, intermittently tearful affect, Insight and Judgment appropriate.   Izora Ribas, NP 11:36 AM [facility]

## 2020-10-15 LAB — CUP PACEART REMOTE DEVICE CHECK
Date Time Interrogation Session: 20211123222809
Implantable Pulse Generator Implant Date: 20180907

## 2020-10-19 ENCOUNTER — Telehealth: Payer: Self-pay | Admitting: Internal Medicine

## 2020-10-19 ENCOUNTER — Encounter: Payer: Self-pay | Admitting: Adult Health

## 2020-10-19 ENCOUNTER — Ambulatory Visit (INDEPENDENT_AMBULATORY_CARE_PROVIDER_SITE_OTHER): Payer: Medicare Other | Admitting: Adult Health

## 2020-10-19 ENCOUNTER — Other Ambulatory Visit: Payer: Self-pay

## 2020-10-19 VITALS — BP 124/76 | HR 93 | Temp 96.4°F | Wt 173.0 lb

## 2020-10-19 DIAGNOSIS — I700 Atherosclerosis of aorta: Secondary | ICD-10-CM

## 2020-10-19 DIAGNOSIS — E1169 Type 2 diabetes mellitus with other specified complication: Secondary | ICD-10-CM

## 2020-10-19 DIAGNOSIS — E1122 Type 2 diabetes mellitus with diabetic chronic kidney disease: Secondary | ICD-10-CM | POA: Diagnosis not present

## 2020-10-19 DIAGNOSIS — E559 Vitamin D deficiency, unspecified: Secondary | ICD-10-CM

## 2020-10-19 DIAGNOSIS — E785 Hyperlipidemia, unspecified: Secondary | ICD-10-CM | POA: Diagnosis not present

## 2020-10-19 DIAGNOSIS — E46 Unspecified protein-calorie malnutrition: Secondary | ICD-10-CM | POA: Diagnosis not present

## 2020-10-19 DIAGNOSIS — I480 Paroxysmal atrial fibrillation: Secondary | ICD-10-CM

## 2020-10-19 DIAGNOSIS — I10 Essential (primary) hypertension: Secondary | ICD-10-CM

## 2020-10-19 DIAGNOSIS — Z23 Encounter for immunization: Secondary | ICD-10-CM | POA: Diagnosis not present

## 2020-10-19 DIAGNOSIS — E8809 Other disorders of plasma-protein metabolism, not elsewhere classified: Secondary | ICD-10-CM

## 2020-10-19 DIAGNOSIS — Z79899 Other long term (current) drug therapy: Secondary | ICD-10-CM

## 2020-10-19 DIAGNOSIS — R000 Tachycardia, unspecified: Secondary | ICD-10-CM

## 2020-10-19 DIAGNOSIS — N182 Chronic kidney disease, stage 2 (mild): Secondary | ICD-10-CM

## 2020-10-19 DIAGNOSIS — E039 Hypothyroidism, unspecified: Secondary | ICD-10-CM

## 2020-10-19 DIAGNOSIS — D6869 Other thrombophilia: Secondary | ICD-10-CM

## 2020-10-19 MED ORDER — TRIAMCINOLONE ACETONIDE 0.1 % EX OINT: TOPICAL_OINTMENT | Freq: Two times a day (BID) | CUTANEOUS | 0 refills | Status: DC

## 2020-10-19 NOTE — Telephone Encounter (Signed)
  Patient states he is not taking any of his medications and would like a call to go over what he is supposed to be taking and when

## 2020-10-19 NOTE — Patient Instructions (Signed)
We will call back about what meds you are taking - please be ready with all your meds by the phone this afternoon  Please bring a list of meds OR your actual medication bottles to each visit so we know what you are taking      HYPERTENSION INFORMATION  Monitor your blood pressure at home, please keep a record and bring that in with you to your next office visit.   Go to the ER if any CP, SOB, nausea, dizziness, severe HA, changes vision/speech  Testing/Procedures: HOW TO TAKE YOUR BLOOD PRESSURE:  Rest 5 minutes before taking your blood pressure.  Don't smoke or drink caffeinated beverages for at least 30 minutes before.  Take your blood pressure before (not after) you eat.  Sit comfortably with your back supported and both feet on the floor (don't cross your legs).  Elevate your arm to heart level on a table or a desk.  Use the proper sized cuff. It should fit smoothly and snugly around your bare upper arm. There should be enough room to slip a fingertip under the cuff. The bottom edge of the cuff should be 1 inch above the crease of the elbow.   Your most recent BP: BP: 124/76   Take your medications faithfully as instructed. Maintain a healthy weight. Get at least 150 minutes of aerobic exercise per week. Minimize salt intake. Minimize alcohol intake  DASH Eating Plan DASH stands for "Dietary Approaches to Stop Hypertension." The DASH eating plan is a healthy eating plan that has been shown to reduce high blood pressure (hypertension). Additional health benefits may include reducing the risk of type 2 diabetes mellitus, heart disease, and stroke. The DASH eating plan may also help with weight loss. WHAT DO I NEED TO KNOW ABOUT THE DASH EATING PLAN? For the DASH eating plan, you will follow these general guidelines:  Choose foods with a percent daily value for sodium of less than 5% (as listed on the food label).  Use salt-free seasonings or herbs instead of table salt  or sea salt.  Check with your health care provider or pharmacist before using salt substitutes.  Eat lower-sodium products, often labeled as "lower sodium" or "no salt added."  Eat fresh foods.  Eat more vegetables, fruits, and low-fat dairy products.  Choose whole grains. Look for the word "whole" as the first word in the ingredient list.  Choose fish and skinless chicken or [place] more often than red meat. Limit fish, poultry, and meat to 6 oz (170 g) each day.  Limit sweets, desserts, sugars, and sugary drinks.  Choose heart-healthy fats.  Limit cheese to 1 oz (28 g) per day.  Eat more home-cooked food and less restaurant, buffet, and fast food.  Limit fried foods.  Cook foods using methods other than frying.  Limit canned vegetables. If you do use them, rinse them well to decrease the sodium.  When eating at a restaurant, ask that your food be prepared with less salt, or no salt if possible. WHAT FOODS CAN I EAT? Seek help from a dietitian for individual calorie needs. Grains Whole grain or whole wheat bread. Brown rice. Whole grain or whole wheat pasta. Quinoa, bulgur, and whole grain cereals. Low-sodium cereals. Corn or whole wheat flour tortillas. Whole grain cornbread. Whole grain crackers. Low-sodium crackers. Vegetables Fresh or frozen vegetables (raw, steamed, roasted, or grilled). Low-sodium or reduced-sodium tomato and vegetable juices. Low-sodium or reduced-sodium tomato sauce and paste. Low-sodium or reduced-sodium canned vegetables.  Fruits  All fresh, canned (in natural juice), or frozen fruits. Meat and Other Protein Products Ground beef (85% or leaner), grass-fed beef, or beef trimmed of fat. Skinless chicken or [place]. Ground chicken or [place]. Pork trimmed of fat. All fish and seafood. Eggs. Dried beans, peas, or lentils. Unsalted nuts and seeds. Unsalted canned beans. Dairy Low-fat dairy products, such as skim or 1% milk, 2% or reduced-fat cheeses, low-fat  ricotta or cottage cheese, or plain low-fat yogurt. Low-sodium or reduced-sodium cheeses. Fats and Oils Tub margarines without trans fats. Light or reduced-fat mayonnaise and salad dressings (reduced sodium). Avocado. Safflower, olive, or canola oils. Natural peanut or almond butter. Other Unsalted popcorn and pretzels. The items listed above may not be a complete list of recommended foods or beverages. Contact your dietitian for more options. WHAT FOODS ARE NOT RECOMMENDED? Grains White bread. White pasta. White rice. Refined cornbread. Bagels and croissants. Crackers that contain trans fat. Vegetables Creamed or fried vegetables. Vegetables in a cheese sauce. Regular canned vegetables. Regular canned tomato sauce and paste. Regular tomato and vegetable juices. Fruits Dried fruits. Canned fruit in light or heavy syrup. Fruit juice. Meat and Other Protein Products Fatty cuts of meat. Ribs, chicken wings, bacon, sausage, bologna, salami, chitterlings, fatback, hot dogs, bratwurst, and packaged luncheon meats. Salted nuts and seeds. Canned beans with salt. Dairy Whole or 2% milk, cream, half-and-half, and cream cheese. Whole-fat or sweetened yogurt. Full-fat cheeses or blue cheese. Nondairy creamers and whipped toppings. Processed cheese, cheese spreads, or cheese curds. Condiments Onion and garlic salt, seasoned salt, table salt, and sea salt. Canned and packaged gravies. Worcestershire sauce. Tartar sauce. Barbecue sauce. Teriyaki sauce. Soy sauce, including reduced sodium. Steak sauce. Fish sauce. Oyster sauce. Cocktail sauce. Horseradish. Ketchup and mustard. Meat flavorings and tenderizers. Bouillon cubes. Hot sauce. Tabasco sauce. Marinades. Taco seasonings. Relishes. Fats and Oils Butter, stick margarine, lard, shortening, ghee, and bacon fat. Coconut, palm kernel, or palm oils. Regular salad dressings. Other Pickles and olives. Salted popcorn and pretzels. The items listed above may not  be a complete list of foods and beverages to avoid. Contact your dietitian for more information. WHERE CAN I FIND MORE INFORMATION? National Heart, [employer], and [employer] Institute: travelstabloid.com Document Released: 10/27/2011 Document Revised: 03/24/2014 Document Reviewed: 09/11/2013 [facility] Patient Information 2015 [place], [place]. This information is not intended to replace advice given to you by your health care provider. Make sure you discuss any questions you have with your health care provider.

## 2020-10-20 LAB — MAGNESIUM: Magnesium: 2 mg/dL (ref 1.5–2.5)

## 2020-10-20 LAB — TSH: TSH: 0.58 mIU/L (ref 0.40–4.50)

## 2020-10-20 LAB — LIPID PANEL
Cholesterol: 119 mg/dL (ref <200)
HDL: 56 mg/dL (ref >=40)
LDL Cholesterol (Calc): 51 mg/dL (calc)
Non-HDL Cholesterol (Calc): 63 mg/dL (calc) (ref <130)
Total CHOL/HDL Ratio: 2.1 (calc) (ref <5.0)
Triglycerides: 41 mg/dL (ref <150)

## 2020-10-20 LAB — CBC WITH DIFFERENTIAL/PLATELET
Absolute Monocytes: 431 cells/uL (ref 200–950)
Basophils Absolute: 59 cells/uL (ref 0–200)
Basophils Relative: 1 %
Eosinophils Absolute: 118 cells/uL (ref 15–500)
Eosinophils Relative: 2 %
HCT: 37.7 % — ABNORMAL LOW (ref 38.5–50.0)
Hemoglobin: 13 g/dL — ABNORMAL LOW (ref 13.2–17.1)
Lymphs Abs: 1581 cells/uL (ref 850–3900)
MCH: 29.7 pg (ref 27.0–33.0)
MCHC: 34.5 g/dL (ref 32.0–36.0)
MCV: 86.3 fL (ref 80.0–100.0)
MPV: 11 fL (ref 7.5–12.5)
Monocytes Relative: 7.3 %
Neutro Abs: 3711 cells/uL (ref 1500–7800)
Neutrophils Relative %: 62.9 %
Platelets: 187 10*3/uL (ref 140–400)
RBC: 4.37 10*6/uL (ref 4.20–5.80)
RDW: 12.9 % (ref 11.0–15.0)
Total Lymphocyte: 26.8 %
WBC: 5.9 10*3/uL (ref 3.8–10.8)

## 2020-10-20 LAB — COMPLETE METABOLIC PANEL WITH GFR
AG Ratio: 1.9 (calc) (ref 1.0–2.5)
ALT: 24 U/L (ref 9–46)
AST: 20 U/L (ref 10–35)
Albumin: 4.1 g/dL (ref 3.6–5.1)
Alkaline phosphatase (APISO): 60 U/L (ref 35–144)
BUN/Creatinine Ratio: 31 (calc) — ABNORMAL HIGH (ref 6–22)
BUN: 31 mg/dL — ABNORMAL HIGH (ref 7–25)
CO2: 26 mmol/L (ref 20–32)
Calcium: 9.5 mg/dL (ref 8.6–10.3)
Chloride: 108 mmol/L (ref 98–110)
Creat: 0.99 mg/dL (ref 0.70–1.18)
GFR, Est African American: 84 mL/min/{1.73_m2} (ref >=60)
GFR, Est Non African American: 72 mL/min/{1.73_m2} (ref >=60)
Globulin: 2.2 g/dL (calc) (ref 1.9–3.7)
Glucose, Bld: 73 mg/dL (ref 65–99)
Potassium: 4.3 mmol/L (ref 3.5–5.3)
Sodium: 142 mmol/L (ref 135–146)
Total Bilirubin: 0.6 mg/dL (ref 0.2–1.2)
Total Protein: 6.3 g/dL (ref 6.1–8.1)

## 2020-10-20 LAB — HEMOGLOBIN A1C
Hgb A1c MFr Bld: 6.4 % of total Hgb — ABNORMAL HIGH (ref <5.7)
Mean Plasma Glucose: 137 (calc)
eAG (mmol/L): 7.6 (calc)

## 2020-10-20 NOTE — Telephone Encounter (Signed)
Spoke with Earl [name] who is asking if Dr Caryl Comes prescribed Carvedilol 3.125mg  bid.  Earl [name] states he is not taking medication and reported this to his PCP yesterday.  Earl [name] advised prescribing provider was Garnet Sierras on 01/21/2020.  RN does not see where this medication was discontinued by a provider.  Requested Earl [name] f/u with his PCP as they were the prescribing provider office.  Earl [name] verbalizes understanding and agrees with current plan.

## 2020-10-21 NOTE — Progress Notes (Signed)
[facility] Summary Report / Loop Recorder 

## 2020-11-16 ENCOUNTER — Ambulatory Visit (INDEPENDENT_AMBULATORY_CARE_PROVIDER_SITE_OTHER): Payer: Medicare Other

## 2020-11-16 DIAGNOSIS — I639 Cerebral infarction, unspecified: Secondary | ICD-10-CM | POA: Diagnosis not present

## 2020-11-17 LAB — CUP PACEART REMOTE DEVICE CHECK
Date Time Interrogation Session: 20211226222930
Implantable Pulse Generator Implant Date: 20180907

## 2020-11-24 ENCOUNTER — Other Ambulatory Visit: Payer: Self-pay | Admitting: Internal Medicine

## 2020-11-24 MED ORDER — LEVOTHYROXINE SODIUM 50 MCG PO TABS
ORAL_TABLET | ORAL | 0 refills | Status: DC
Start: 2020-11-24 — End: 2020-11-26

## 2020-11-26 ENCOUNTER — Other Ambulatory Visit: Payer: Self-pay | Admitting: Internal Medicine

## 2020-11-26 DIAGNOSIS — E039 Hypothyroidism, unspecified: Secondary | ICD-10-CM

## 2020-11-26 MED ORDER — LEVOTHYROXINE SODIUM 50 MCG PO TABS: ORAL_TABLET | ORAL | 0 refills | Status: DC

## 2020-11-27 NOTE — Progress Notes (Signed)
[facility] Summary Report / Loop Recorder 

## 2020-11-28 ENCOUNTER — Other Ambulatory Visit: Payer: Self-pay | Admitting: Internal Medicine

## 2020-11-28 DIAGNOSIS — E039 Hypothyroidism, unspecified: Secondary | ICD-10-CM

## 2020-11-28 MED ORDER — LEVOTHYROXINE SODIUM 50 MCG PO TABS: ORAL_TABLET | ORAL | 0 refills | Status: DC

## 2020-12-10 ENCOUNTER — Telehealth: Payer: Self-pay | Admitting: Oncology

## 2020-12-10 NOTE — Telephone Encounter (Signed)
Rescheduled 02/18 appointment to 02/02 and 02/09 due to provider pal, patient has been called and notified regarding schedule changes.

## 2020-12-19 ENCOUNTER — Other Ambulatory Visit: Payer: Self-pay | Admitting: Internal Medicine

## 2020-12-19 LAB — CUP PACEART REMOTE DEVICE CHECK
Date Time Interrogation Session: 20220128223339
Implantable Pulse Generator Implant Date: 20180907

## 2020-12-21 ENCOUNTER — Ambulatory Visit (INDEPENDENT_AMBULATORY_CARE_PROVIDER_SITE_OTHER): Payer: Medicare Other

## 2020-12-21 DIAGNOSIS — I639 Cerebral infarction, unspecified: Secondary | ICD-10-CM

## 2020-12-23 ENCOUNTER — Other Ambulatory Visit: Payer: Self-pay

## 2020-12-23 ENCOUNTER — Inpatient Hospital Stay: Payer: Medicare Other | Attending: Oncology

## 2020-12-23 DIAGNOSIS — E119 Type 2 diabetes mellitus without complications: Secondary | ICD-10-CM | POA: Insufficient documentation

## 2020-12-23 DIAGNOSIS — Z7901 Long term (current) use of anticoagulants: Secondary | ICD-10-CM | POA: Diagnosis not present

## 2020-12-23 DIAGNOSIS — Z7984 Long term (current) use of oral hypoglycemic drugs: Secondary | ICD-10-CM | POA: Diagnosis not present

## 2020-12-23 DIAGNOSIS — Z79899 Other long term (current) drug therapy: Secondary | ICD-10-CM | POA: Insufficient documentation

## 2020-12-23 DIAGNOSIS — D472 Monoclonal gammopathy: Secondary | ICD-10-CM | POA: Diagnosis not present

## 2020-12-23 LAB — CBC WITH DIFFERENTIAL (CANCER CENTER ONLY)
Abs Immature Granulocytes: 0.02 10*3/uL (ref 0.00–0.07)
Basophils Absolute: 0.1 10*3/uL (ref 0.0–0.1)
Basophils Relative: 1 %
Eosinophils Absolute: 0.4 10*3/uL (ref 0.0–0.5)
Eosinophils Relative: 7 %
HCT: 37.4 % — ABNORMAL LOW (ref 39.0–52.0)
Hemoglobin: 12.7 g/dL — ABNORMAL LOW (ref 13.0–17.0)
Immature Granulocytes: 0 %
Lymphocytes Relative: 28 %
Lymphs Abs: 1.5 10*3/uL (ref 0.7–4.0)
MCH: 29.8 pg (ref 26.0–34.0)
MCHC: 34 g/dL (ref 30.0–36.0)
MCV: 87.8 fL (ref 80.0–100.0)
Monocytes Absolute: 0.5 10*3/uL (ref 0.1–1.0)
Monocytes Relative: 9 %
Neutro Abs: 3 10*3/uL (ref 1.7–7.7)
Neutrophils Relative %: 55 %
Platelet Count: 187 10*3/uL (ref 150–400)
RBC: 4.26 MIL/uL (ref 4.22–5.81)
RDW: 13.9 % (ref 11.5–15.5)
WBC Count: 5.6 10*3/uL (ref 4.0–10.5)
nRBC: 0 % (ref 0.0–0.2)

## 2020-12-23 LAB — CMP (CANCER CENTER ONLY)
ALT: 27 U/L (ref 0–44)
AST: 21 U/L (ref 15–41)
Albumin: 3.7 g/dL (ref 3.5–5.0)
Alkaline Phosphatase: 66 U/L (ref 38–126)
Anion gap: 8 (ref 5–15)
BUN: 25 mg/dL — ABNORMAL HIGH (ref 8–23)
CO2: 28 mmol/L (ref 22–32)
Calcium: 9.4 mg/dL (ref 8.9–10.3)
Chloride: 107 mmol/L (ref 98–111)
Creatinine: 1.23 mg/dL (ref 0.61–1.24)
GFR, Estimated: 60 mL/min — ABNORMAL LOW (ref >60)
Glucose, Bld: 145 mg/dL — ABNORMAL HIGH (ref 70–99)
Potassium: 4.4 mmol/L (ref 3.5–5.1)
Sodium: 143 mmol/L (ref 135–145)
Total Bilirubin: 0.9 mg/dL (ref 0.3–1.2)
Total Protein: 6.4 g/dL — ABNORMAL LOW (ref 6.5–8.1)

## 2020-12-24 LAB — KAPPA/LAMBDA LIGHT CHAINS
Kappa free light chain: 14.9 mg/L (ref 3.3–19.4)
Kappa, lambda light chain ratio: 1.04 (ref 0.26–1.65)
Lambda free light chains: 14.3 mg/L (ref 5.7–26.3)

## 2020-12-24 NOTE — Progress Notes (Signed)
Cardiology Office Note Date:  12/24/2020  Patient ID:  Earl, [name] [birth year], MRN [id number] PCP:  [name], MD  Cardiologist:  Dr. Caryl Comes    Chief Complaint:   annual visit  History of Present Illness: Earl [name] is a 80 y.o. 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  He comes in today to be seen for Dr. Caryl Comes.  Last seen by him Jan 2020, at that time, note mentions physical decline in association with his wife 3rd cancer diagnosis, Improving with PT, some dizziness that was provoked by hyperextension of the head and to a lesser degree by standing.  Denied shower intolerance but is using a shower chair. No changes were made.   I saw him 12/16/19 He reports doing well.  Denioes any kind of CP, palpitations or cardiac awareness.  No SOB at rest or with sleep.  He continues with PT and gets around his home well, denies any difficulties or exertional intolerances with his ADLs or therapy.  No dizzy spells, near syncope or syncope.  He mentions that he used to have issues with getting a little lightheaded but has not had this in "a long time" He reports drinking 3 bottles of water a day and feels like he is well hydrated. Noted some mild sweling to his LE in the last few months, wearing support stockings,  no pain or injuries, R>L and denies any missed doses of his Eliquis. He was in ST that slowed some, not symptomatic, recent labs looked OK, mentioned he was nervous. No changes were made. He had some LE edema, not felt to be cardiac and asked to elevate his feet while seated.  Subsequently started on lasix, diastolic dysfunction noted, saw A. Tillery, PA, edema was better, pt was drinking quite a bit of water, discussed fluid restriction and reduced his lasix dosing He had follow up again in may 2021, doing well, no changes were made.  Last Primary team notes he is off his coreg and lisinopril 2/2 hypotension   TODAY He is feeling  OK. Wishes he could get out in his yard and do more around the ouse, gives an example like up on the roof for maintenance and cleaning. He says his family tells him he can not do those anymore. I urged that they are right! He denies any difficulties, since his stroke his balance is off and walks with the aid of a cane.  No CP, palpitations or cardiac awareness.  No SOB or DOE with ADLs No near syncoe or syncope. He denies falls.  He reports home BPs generally 110's/60's No orthostatic symptoms  No bleeding or signs of bleeding  Device information MDT ILR, implanted 07/28/2017, cryptogenic stroke >>> + finding of AF   Past Medical History:  Diagnosis Date  . Adenomatous colon polyp   . Allergy   . Anal fissure   . Anxiety   . Arthritis   . BPH (benign prostatic hyperplasia)   . Cataract   . Diabetes mellitus ([place])   . GERD (gastroesophageal reflux disease)   . Hyperlipidemia   . Hypertension   . Hypothyroidism   . Other testicular hypofunction   . Stroke ([place])   . Type II or unspecified type diabetes mellitus without mention of complication, not stated as uncontrolled     Past Surgical History:  Procedure Laterality Date  . CIRCUMCISION N/A 06/11/2019   Procedure: CIRCUMCISION ADULT;  Surgeon: [name], MD;  Location: [facility];  Service: Urology;  Laterality: N/A;  . COLONOSCOPY    . INGUINAL HERNIA REPAIR Bilateral    1968 and 2004  . LOOP RECORDER INSERTION N/A 07/28/2017   Procedure: LOOP RECORDER INSERTION;  Surgeon: Deboraha Sprang, MD;  Location: [place] [facility];  Service: Cardiovascular;  Laterality: N/A;  . TEE WITHOUT CARDIOVERSION N/A 07/28/2017   Procedure: TRANSESOPHAGEAL ECHOCARDIOGRAM (TEE);  Surgeon: Sueanne Margarita, MD;  Location: [facility];  Service: Cardiovascular;  Laterality: N/A;    Current Outpatient Medications  Medication Sig Dispense Refill  . apixaban (ELIQUIS) 5 MG TABS tablet Take     1 tablet     2 x  /day      to prevent Blood  Clots 180 tablet 0  . atorvastatin (LIPITOR) 40 MG tablet Patient knows to take by mouth  !   - Thanks 90 tablet 0  . Blood Glucose Monitoring Suppl (ONE TOUCH ULTRA 2) w/Device KIT Check blood sugar 1 time a day-DX-E11.9 1 kit 0  . Cholecalciferol (VITAMIN D PO) Take 1 tablet by mouth daily.     . Cyanocobalamin (B-12) 50 MCG TABS Take 50 mcg by mouth daily.    . finasteride (PROSCAR) 5 MG tablet TAKE 1 TABLET BY MOUTH  DAILY FOR PROSTATE 90 tablet 3  . furosemide (LASIX) 40 MG tablet Take 1 tablet (40 mg total) by mouth daily. 90 tablet 3  . gabapentin (NEURONTIN) 300 MG capsule Take 1 to 2 capsules 1 hour before Bedtime for Diabetic Neuropathy Pains 180 capsule 2  . glipiZIDE (GLUCOTROL) 5 MG tablet Take 5 mg by mouth daily.    [name] glucose blood (ONETOUCH ULTRA) test strip CHECK BLOOD SUGAR ONCE A DAY. 100 strip 3  . Lancets (ONETOUCH DELICA PLUS [id number]) MISC CHECK BLOOD SUGAR ONCE A DAY. 100 each 0  . Lancets Misc. (ONE TOUCH SURESOFT) MISC Check blood sugar 1 time a day-DX-E11.91 1 each 0  . levothyroxine (SYNTHROID) 50 MCG tablet Take      1 tablet    every Morning        on an empty stomach          with only water        for 30 minutes & no Antacid meds, Calcium or Magnesium for 4 hours & avoid Biotin 90 tablet 0  . Magnesium 250 MG TABS Take 250 mg by mouth daily.     . metFORMIN (GLUCOPHAGE-XR) 500 MG 24 hr tablet TAKE 2 TABLETS BY MOUTH 2  TIMES DAILY WITH MEALS FOR  DIABETES 360 tablet 3  . Multiple Vitamin (MULTIVITAMIN) capsule Take 1 capsule by mouth daily.      . sodium chloride (OCEAN) 0.65 % nasal spray Place 2 sprays into the nose as needed for congestion. 30 mL 3  . triamcinolone ointment (KENALOG) 0.1 % Apply topically 2 (two) times daily. 15 g 0  . vitamin C (ASCORBIC ACID) 500 MG tablet Take 500 mg by mouth daily.       No current facility-administered medications for this visit.    Allergies:   Penicillins   Social History:  The patient  reports that he has never  smoked. He has never used smokeless tobacco. He reports that he does not drink alcohol and does not use drugs.   Family History:  The patient's family history includes Colon cancer in his father; Diabetes in his father; Sickle cell anemia in his mother; Stomach cancer in his father.  ROS:  Please see the  history of present illness.    All other systems are reviewed and otherwise negative.   PHYSICAL EXAM:  VS:  There were no vitals taken for this visit. BMI: There is no height or weight on file to calculate BMI. Well nourished, well developed, in no acute distress  HEENT: normocephalic, atraumatic  Neck: no JVD, carotid bruits or masses Cardiac:  RRR; no significant murmurs, no rubs, or gallops Lungs:  CTA b/l, no wheezing, rhonchi or rales  Abd: soft, nontender MS: no deformity,  age appropriate atrophy Ext: trace-1+ edema, wearing support stockings  Skin: warm and dry, no rash Neuro:  No gross deficits appreciated Psych: euthymic mood, full affect  ILR site is stable, no tethering or discomfort   EKG:  Not done today  ILR interrogation done today and reviewed by myself:  SR today  No AFib/episodes  07/28/2017 TEE Study Conclusions - Left ventricle: Wall thickness was increased in a pattern of   moderate LVH. Systolic function was normal. The estimated   ejection fraction was in the range of 55% to 60%. Wall motion was   normal; there were no regional wall motion abnormalities. - Aortic valve: There was mild regurgitation. - Mitral valve: There was mild regurgitation. - Left atrium: No evidence of thrombus in the atrial cavity or   appendage. - Right atrium: No evidence of thrombus in the atrial cavity or   appendage. - Atrial septum: The interatrial septum was hypermobile. - Tricuspid valve: There was mild regurgitation. - Pulmonic valve: There was mild regurgitation, with multiple jets. - Impressions: No cardiac source of emboli was indentified.  Impressions: - No  cardiac source of emboli was indentified.   07/26/2017: TTE Study Conclusions - Left ventricle: The cavity size was normal. Wall thickness was   increased in a pattern of mild LVH. Systolic function was normal.   The estimated ejection fraction was in the range of 55% to 60%.   Wall motion was normal; there were no regional wall motion   abnormalities. Features are consistent with a pseudonormal left   ventricular filling pattern, with concomitant abnormal relaxation   and increased filling pressure (grade 2 diastolic dysfunction). - Aortic valve: There was mild regurgitation. - Mitral valve: There was mild regurgitation. - Right ventricle: The cavity size was mildly dilated. - Pulmonary arteries: Systolic pressure was mildly increased. PA   peak pressure: 39 mm Hg (S).  Impressions: - Normal LV systolic function; moderate diastolic dysfunction; mild   LVH; mild AI; mild MR; mild RVE; mild TR with mildly elevated   pulmonary pressure.    Recent Labs: 02/13/2020: Brain Natriuretic Peptide 234 10/19/2020: Magnesium 2.0; TSH 0.58 12/23/2020: ALT 27; BUN 25; Creatinine 1.23; Hemoglobin 12.7; Platelet Count 187; Potassium 4.4; Sodium 143  10/19/2020: Cholesterol 119; HDL 56; LDL Cholesterol (Calc) 51; Total CHOL/HDL Ratio 2.1; Triglycerides 41   CrCl cannot be calculated (Unknown ideal weight.).   Wt Readings from Last 3 Encounters:  10/19/20 173 lb (78.5 kg)  08/05/20 174 lb (78.9 kg)  07/13/20 172 lb (78 kg)     Other studies reviewed: Additional studies/records reviewed today include: summarized above  ASSESSMENT AND PLAN:  1. Paroxysmal Afib     CHA2DS2Vasc is 6, on Eliquis, appropriately dosed     0 % burden  2. HTN      primary visit mention off coreg and lisinopril 2/2 hypotension      No symptoms of hypotension      BP looks good today, reports typically  SBP 110s at home  3. Sinus tachycardia     Off coreg 2/2 reports of hypotension     HR histogram looks  OK   5. Mild LE edema to below the shin R>L    Maintained on lasix    Labs last week, look ok       Disposition: he sees his PMD team pretty regularly, sees oncology later this week, we will see him back in a year, sooner if needed.  Loop remotes as usual  Current medicines are reviewed at length with the patient today.  The patient did not have any concerns regarding medicines.  Venetia Night, PA-C 12/24/2020 1:58 PM     [address] [place] [address] [phone] (office)  [phone] (fax)

## 2020-12-25 LAB — MULTIPLE MYELOMA PANEL, SERUM
Albumin SerPl Elph-Mcnc: 3.6 g/dL (ref 2.9–4.4)
Albumin/Glob SerPl: 1.6 (ref 0.7–1.7)
Alpha 1: 0.2 g/dL (ref 0.0–0.4)
Alpha2 Glob SerPl Elph-Mcnc: 0.6 g/dL (ref 0.4–1.0)
B-Globulin SerPl Elph-Mcnc: 1 g/dL (ref 0.7–1.3)
Gamma Glob SerPl Elph-Mcnc: 0.5 g/dL (ref 0.4–1.8)
Globulin, Total: 2.3 g/dL (ref 2.2–3.9)
IgA: 293 mg/dL (ref 61–437)
IgG (Immunoglobin G), Serum: 634 mg/dL (ref 603–1613)
IgM (Immunoglobulin M), Srm: 12 mg/dL — ABNORMAL LOW (ref 15–143)
M Protein SerPl Elph-Mcnc: 0.2 g/dL — ABNORMAL HIGH
Total Protein ELP: 5.9 g/dL — ABNORMAL LOW (ref 6.0–8.5)

## 2020-12-28 ENCOUNTER — Ambulatory Visit (INDEPENDENT_AMBULATORY_CARE_PROVIDER_SITE_OTHER): Payer: Medicare Other | Admitting: Physician Assistant

## 2020-12-28 ENCOUNTER — Other Ambulatory Visit: Payer: Self-pay

## 2020-12-28 ENCOUNTER — Encounter: Payer: Self-pay | Admitting: Physician Assistant

## 2020-12-28 VITALS — BP 124/60 | HR 100 | Ht 71.75 in | Wt 175.8 lb

## 2020-12-28 DIAGNOSIS — Z4509 Encounter for adjustment and management of other cardiac device: Secondary | ICD-10-CM

## 2020-12-28 DIAGNOSIS — I10 Essential (primary) hypertension: Secondary | ICD-10-CM

## 2020-12-28 DIAGNOSIS — R609 Edema, unspecified: Secondary | ICD-10-CM

## 2020-12-28 DIAGNOSIS — I480 Paroxysmal atrial fibrillation: Secondary | ICD-10-CM

## 2020-12-28 LAB — CUP PACEART INCLINIC DEVICE CHECK
Date Time Interrogation Session: 20220207134310
Implantable Pulse Generator Implant Date: 20180907

## 2020-12-28 MED ORDER — FUROSEMIDE 40 MG PO TABS: 40.0000 mg | ORAL_TABLET | Freq: Every day | ORAL | 3 refills | Status: DC

## 2020-12-28 NOTE — Patient Instructions (Addendum)
Medication Instructions:   Your physician recommends that you continue on your current medications as directed. Please refer to the Current Medication list given to you today.  *If you need a refill on your cardiac medications before your next appointment, please call your pharmacy*   Lab Work: NONE ORDERED  TODAY   If you have labs (blood work) drawn today and your tests are completely normal, you will receive your results only by: . MyChart Message (if you have MyChart) OR . A paper copy in the mail If you have any lab test that is abnormal or we need to change your treatment, we will call you to review the results.   Testing/Procedures: NONE ORDERED  TODAY   Follow-Up: At [facility], you and your health needs are our priority.  As part of our continuing mission to provide you with exceptional heart care, we have created designated Provider Care Teams.  These Care Teams include your primary Cardiologist (physician) and Advanced Practice Providers (APPs -  Physician Assistants and Nurse Practitioners) who all work together to provide you with the care you need, when you need it.  We recommend signing up for the patient portal called "MyChart".  Sign up information is provided on this After Visit Summary.  MyChart is used to connect with patients for Virtual Visits (Telemedicine).  Patients are able to view lab/test results, encounter notes, upcoming appointments, etc.  Non-urgent messages can be sent to your provider as well.   To learn more about what you can do with MyChart, go to [email or web address].    Your next appointment:   1 year(s)  The format for your next appointment:   In Person  Provider:   Steven Klein, MD   Other Instructions   

## 2020-12-30 ENCOUNTER — Inpatient Hospital Stay (HOSPITAL_BASED_OUTPATIENT_CLINIC_OR_DEPARTMENT_OTHER): Payer: Medicare Other | Admitting: Oncology

## 2020-12-30 ENCOUNTER — Other Ambulatory Visit: Payer: Self-pay

## 2020-12-30 VITALS — BP 126/73 | HR 87 | Temp 97.0°F | Resp 18 | Ht 71.75 in | Wt 171.1 lb

## 2020-12-30 DIAGNOSIS — Z7901 Long term (current) use of anticoagulants: Secondary | ICD-10-CM | POA: Diagnosis not present

## 2020-12-30 DIAGNOSIS — D472 Monoclonal gammopathy: Secondary | ICD-10-CM | POA: Diagnosis not present

## 2020-12-30 DIAGNOSIS — E119 Type 2 diabetes mellitus without complications: Secondary | ICD-10-CM | POA: Diagnosis not present

## 2020-12-30 DIAGNOSIS — Z79899 Other long term (current) drug therapy: Secondary | ICD-10-CM | POA: Diagnosis not present

## 2020-12-30 DIAGNOSIS — Z7984 Long term (current) use of oral hypoglycemic drugs: Secondary | ICD-10-CM | POA: Diagnosis not present

## 2020-12-30 NOTE — Progress Notes (Signed)
[facility] Summary Report / Loop Recorder 

## 2020-12-30 NOTE — Progress Notes (Signed)
Hematology and Oncology Follow Up Visit  Earl [name] [id number] 10-May-1941 80 y.o. 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 He was found to have IgG kappa with M spike of close to 0.3 g/dL. He also had a clonal IgA lambda previously.    Prior Therapy: Status post bone marrow biopsy on 02/14/2003 which showed 2% plasma cell involvement.  Current therapy: Active surveillance.   Interim History:  Earl [name] returns today for a repeat follow-up. Since the last visit, he reports no major changes in his health.  He denies any nausea, vomiting or abdominal pain.  He denies any recent hospitalization or illnesses.  Performance status quality of life remained excellent.  He denies any bone pain or pathological fractures.  He does ambulate with the help of a cane without any falls or syncope.  He denies any difficulties with driving or bleeding.    Medications: Unchanged on review. Current Outpatient Medications  Medication Sig Dispense Refill  . apixaban (ELIQUIS) 5 MG TABS tablet Take     1 tablet     2 x  /day      to prevent Blood Clots 180 tablet 0  . atorvastatin (LIPITOR) 40 MG tablet Patient knows to take by mouth  !   - Thanks 90 tablet 0  . Blood Glucose Monitoring Suppl (ONE TOUCH ULTRA 2) w/Device KIT Check blood sugar 1 time a day-DX-E11.9 1 kit 0  . Cholecalciferol (VITAMIN D PO) Take 1 tablet by mouth daily.     . Cyanocobalamin (B-12) 50 MCG TABS Take 50 mcg by mouth daily.    . finasteride (PROSCAR) 5 MG tablet TAKE 1 TABLET BY MOUTH  DAILY FOR PROSTATE 90 tablet 3  . furosemide (LASIX) 40 MG tablet Take 1 tablet (40 mg total) by mouth daily. 90 tablet 3  . gabapentin (NEURONTIN) 300 MG capsule Take 1 to 2 capsules 1 hour before Bedtime for Diabetic Neuropathy Pains 180 capsule 2  . glipiZIDE (GLUCOTROL) 5 MG tablet Take 5 mg by mouth daily.    [name] glucose blood (ONETOUCH ULTRA) test strip  CHECK BLOOD SUGAR ONCE A DAY. 100 strip 3  . Lancets (ONETOUCH DELICA PLUS [id number]) MISC CHECK BLOOD SUGAR ONCE A DAY. 100 each 0  . Lancets Misc. (ONE TOUCH SURESOFT) MISC Check blood sugar 1 time a day-DX-E11.91 1 each 0  . levothyroxine (SYNTHROID) 50 MCG tablet Take      1 tablet    every Morning        on an empty stomach          with only water        for 30 minutes & no Antacid meds, Calcium or Magnesium for 4 hours & avoid Biotin 90 tablet 0  . Magnesium 250 MG TABS Take 250 mg by mouth daily.     . metFORMIN (GLUCOPHAGE-XR) 500 MG 24 hr tablet TAKE 2 TABLETS BY MOUTH 2  TIMES DAILY WITH MEALS FOR  DIABETES 360 tablet 3  . Multiple Vitamin (MULTIVITAMIN) capsule Take 1 capsule by mouth daily.    . sodium chloride (OCEAN) 0.65 % nasal spray Place 2 sprays into the nose as needed for congestion. 30 mL 3  . triamcinolone ointment (KENALOG) 0.1 % Apply topically 2 (two) times daily. 15 g 0  . vitamin C (ASCORBIC ACID) 500 MG tablet Take 500 mg by mouth daily.     No current facility-administered medications for  this visit.     Allergies:  Allergies  Allergen Reactions  . Penicillins Other (See Comments)      Physical Exam: Blood pressure 126/73, pulse 87, temperature (!) 97 F (36.1 C), temperature source Tympanic, resp. rate 18, height 5' 11.75" (1.822 m), weight 171 lb 1.6 oz (77.6 kg), SpO2 100 %.    ECOG: 1   General appearance: Comfortable appearing without any discomfort Head: Normocephalic without any trauma Oropharynx: Mucous membranes are moist and pink without any thrush or ulcers. Eyes: Pupils are equal and round reactive to light. Lymph nodes: No cervical, supraclavicular, inguinal or axillary lymphadenopathy.   Heart:regular rate and rhythm.  S1 and S2 without leg edema. Lung: Clear without any rhonchi or wheezes.  No dullness to percussion. Abdomin: Soft, nontender, nondistended with good bowel sounds.  No hepatosplenomegaly. Musculoskeletal: No joint  deformity or effusion.  Full range of motion noted. Neurological: No deficits noted on motor, sensory and deep tendon reflex exam. Skin: No petechial rash or dryness.  Appeared moist.       Lab Results: Lab Results  Component Value Date   WBC 5.6 12/23/2020   HGB 12.7 (L) 12/23/2020   HCT 37.4 (L) 12/23/2020   MCV 87.8 12/23/2020   PLT 187 12/23/2020     Chemistry      Component Value Date/Time   NA 143 12/23/2020 1104   NA 142 04/09/2020 1239   NA 140 10/25/2017 0753   K 4.4 12/23/2020 1104   K 4.2 10/25/2017 0753   CL 107 12/23/2020 1104   CL 101 10/23/2012 0802   CO2 28 12/23/2020 1104   CO2 21 (L) 10/25/2017 0753   BUN 25 (H) 12/23/2020 1104   BUN 19 04/09/2020 1239   BUN 13.1 10/25/2017 0753   CREATININE 1.23 12/23/2020 1104   CREATININE 0.99 10/19/2020 1201   CREATININE 1.1 10/25/2017 0753      Component Value Date/Time   CALCIUM 9.4 12/23/2020 1104   CALCIUM 9.1 10/25/2017 0753   ALKPHOS 66 12/23/2020 1104   ALKPHOS 85 10/25/2017 0753   AST 21 12/23/2020 1104   AST 17 10/25/2017 0753   ALT 27 12/23/2020 1104   ALT 19 10/25/2017 0753   BILITOT 0.9 12/23/2020 1104   BILITOT 0.48 10/25/2017 0753       Results for Earl [name] (MRN [id number]) as of 12/30/2020 08:53  Ref. Range 12/23/2020 11:04  M Protein SerPl Elph-Mcnc Latest Ref Range: Not Observed g/dL 0.2 (H)  IFE 1 Unknown Comment (A)  Globulin, Total Latest Ref Range: 2.2 - 3.9 g/dL 2.3  B-Globulin SerPl Elph-Mcnc Latest Ref Range: 0.7 - 1.3 g/dL 1.0  IgG (Immunoglobin G), Serum Latest Ref Range: 603 - 1,613 mg/dL 634  IgM (Immunoglobulin M), Srm Latest Ref Range: 15 - 143 mg/dL 12 (L)  IgA Latest Ref Range: 61 - 437 mg/dL 293   Results for Earl [name] (MRN [id number]) as of 12/30/2020 08:53  Ref. Range 12/23/2020 11:04  M Protein SerPl Elph-Mcnc Latest Ref Range: Not Observed g/dL 0.2 (H)  IFE 1 Unknown Comment (A)  Globulin, Total Latest Ref Range: 2.2 - 3.9 g/dL 2.3  B-Globulin SerPl  Elph-Mcnc Latest Ref Range: 0.7 - 1.3 g/dL 1.0  IgG (Immunoglobin G), Serum Latest Ref Range: 603 - 1,613 mg/dL 634  IgM (Immunoglobulin M), Srm Latest Ref Range: 15 - 143 mg/dL 12 (L)  IgA Latest Ref Range: 61 - 437 mg/dL 293    Impression and Plan:   80 year old man with:  1.  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  He remains on active surveillance.     The natural course of this disease was reviewed at this time. Protein studies the last 18 years were discussed. He had a fluctuating biclonal gammopathy which is predominantly IgG kappa and IgA lambda with small M protein and no signs or symptoms of endorgan damage. These findings could be a true plasma cell disorder versus more reactive findings. There is no evidence to suggest multiple myeloma or asymptomatic plasma cell disorder.  Based on these findings I recommended continued active surveillance annually without any intervention.   2. Diabetes mellitus: No recent exacerbation or hospitalization.  3. Follow-up: In 1 year for repeat follow-up.  30  minutes were spent on this visit today. The time was dedicated to reviewing laboratory data, disease status update and outlining future plan of care discussion.  Zola Button, MD 2/9/20228:52 AM

## 2021-01-01 ENCOUNTER — Other Ambulatory Visit: Payer: Medicare Other

## 2021-01-08 ENCOUNTER — Ambulatory Visit: Payer: Medicare Other | Admitting: Oncology

## 2021-01-19 DIAGNOSIS — H04212 Epiphora due to excess lacrimation, left lacrimal gland: Secondary | ICD-10-CM | POA: Diagnosis not present

## 2021-01-19 DIAGNOSIS — H04122 Dry eye syndrome of left lacrimal gland: Secondary | ICD-10-CM | POA: Diagnosis not present

## 2021-01-25 ENCOUNTER — Other Ambulatory Visit: Payer: Self-pay | Admitting: Internal Medicine

## 2021-01-25 ENCOUNTER — Ambulatory Visit (INDEPENDENT_AMBULATORY_CARE_PROVIDER_SITE_OTHER): Payer: Medicare Other

## 2021-01-25 DIAGNOSIS — I480 Paroxysmal atrial fibrillation: Secondary | ICD-10-CM

## 2021-01-26 LAB — CUP PACEART REMOTE DEVICE CHECK
Date Time Interrogation Session: 20220302223740
Implantable Pulse Generator Implant Date: 20180907

## 2021-01-27 ENCOUNTER — Other Ambulatory Visit: Payer: Self-pay | Admitting: Internal Medicine

## 2021-01-27 DIAGNOSIS — E039 Hypothyroidism, unspecified: Secondary | ICD-10-CM

## 2021-01-28 NOTE — Progress Notes (Signed)
History of Present Illness:       This very nice 80 y.o. [facility] presents for 6 month follow up with HTN, HLD, T2_NIDDM and Vitamin D Deficiency.  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  Abdominal CT scan in 2016 found Aortic Atherosclerosis.       Patient is treated for HTN (1980) & BP has been controlled . Today's BP is at goal -  132/68.  In 2018, patient had a Rt Cerebellar thrombotic CVA and was started on Eliquis for Afib.  He has persistent Rt sided HP & ataxia. Patient is followed by Dr Caryl Comes & associates.  Patient has had no complaints of any cardiac type chest pain, palpitations, dyspnea / orthopnea / PND, dizziness, claudication, or dependent edema.       Hyperlipidemia is controlled with diet & Atorvastatin. Patient denies myalgias or other med SE's. 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  Last A1c was   Lab Results  Component Value Date   HGBA1C 6.4 (H) 10/19/2020                                               Patient was dx'd Hypothyroid in 2014 and was initiated on Thyroid Replacement & Goiter suppression.       Further, the patient also has history of Vitamin D Deficiency ("30" /2008) and supplements vitamin D without any suspected side-effects. Last vitamin D was at goal:  Lab Results  Component Value Date   VD25OH 64 07/13/2020    Current Outpatient Medications on File Prior to Visit  Medication Sig  . atorvastatin  40 MG tablet   . VITAMIN D  Take 1 tablet daily.   . Cyanocobalamin (B-12) 50 MCG TABS Take 50 mcg  daily.  [name] ELIQUIS 5 MG TABS tablet TAKE 1 TABLET   TWICE DAILY   . finasteride  5 MG tablet TAKE 1  TABLET   DAILY FOR PROSTATE  . furosemide  40 MG tablet Take 1 tablet  daily.  . Gabapentin 300 MG capsule Take 1 to 2 capsules 1 hour before Bedtime   . glipiZIDE  5 MG tablet Take  daily.  [name] levothyroxine   50 MCG tablet TAKE 1 TABLET   DAILY  . Magnesium 250 MG TABS Take daily.   . metFORMIN-XR 500 MG 24 hr tablet TAKE 2 TABLETS  2  TIMES DAILY   . Multiple Vitamin  capsule Take 1 capsule  daily.  . sodium chloride.65 % nasal spray Place 2 sprays into the nose as needed for congestion.  . triamcinolone ointment  0.1 % Apply topically 2  times daily.  . vitamin C 500 MG tablet Take  daily.     Allergies  Allergen Reactions  . Penicillins Other (See Comments)    PMHx:   Past Medical History:  Diagnosis Date  . Adenomatous colon polyp   . Allergy   . Anal fissure   . Anxiety   .  Arthritis   . BPH (benign prostatic hyperplasia)   . Cataract   . Diabetes mellitus ([place])   . GERD (gastroesophageal reflux disease)   . Hyperlipidemia   . Hypertension   . Hypothyroidism   . Other testicular hypofunction   . Stroke ([place])   . Type II or unspecified type diabetes mellitus without mention of complication, not stated as uncontrolled     Immunization History  Administered Date(s) Administered  . DT (Pediatric) 02/09/2015  . Influenza Split 08/07/2013  . Influenza, High Dose Seasonal PF 08/19/2014, 08/28/2015, 09/13/2016, 08/15/2017, 08/15/2018, 09/06/2019, 10/19/2020  . PFIZER(Purple Top)SARS-COV-2 Vaccination 12/11/2019, 01/01/2020  . Pneumococcal Conjugate-13 06/10/2016  . Pneumococcal Polysaccharide-23 08/13/2008  . Td 11/22/2003    Past Surgical History:  Procedure Laterality Date  . CIRCUMCISION N/A 06/11/2019   Procedure: CIRCUMCISION ADULT;  Surgeon: [name], MD;  Location: [facility];  Service: Urology;  Laterality: N/A;  . COLONOSCOPY    . INGUINAL HERNIA REPAIR Bilateral    1968 and 2004  . LOOP RECORDER INSERTION N/A 07/28/2017   Procedure: LOOP RECORDER  INSERTION;  Surgeon: Deboraha Sprang, MD;  Location: [facility];  Service: Cardiovascular;  Laterality: N/A;  . TEE WITHOUT CARDIOVERSION N/A 07/28/2017   Procedure: TRANSESOPHAGEAL ECHOCARDIOGRAM (TEE);  Surgeon: Sueanne Margarita, MD;  Location: [facility] ENDOSCOPY;  Service: Cardiovascular;  Laterality: N/A;    FHx:    Reviewed / unchanged  SHx:    Reviewed / unchanged   Systems Review:  Constitutional: Denies fever, chills, wt changes, headaches, insomnia, fatigue, night sweats, change in appetite. Eyes: Denies redness, blurred vision, diplopia, discharge, itchy, watery eyes.  ENT: Denies discharge, congestion, post nasal drip, epistaxis, sore throat, earache, hearing loss, dental pain, tinnitus, vertigo, sinus pain, snoring.  CV: Denies chest pain, palpitations, irregular heartbeat, syncope, dyspnea, diaphoresis, orthopnea, PND, claudication or edema. Respiratory: denies cough, dyspnea, DOE, pleurisy, hoarseness, laryngitis, wheezing.  Gastrointestinal: Denies dysphagia, odynophagia, heartburn, reflux, water brash, abdominal pain or cramps, nausea, vomiting, bloating, diarrhea, constipation, hematemesis, melena, hematochezia  or hemorrhoids. Genitourinary: Denies dysuria, frequency, urgency, nocturia, hesitancy, discharge, hematuria or flank pain. Musculoskeletal: Denies arthralgias, myalgias, stiffness, jt. swelling, pain, limping or strain/sprain.  Skin: Denies pruritus, rash, hives, warts, acne, eczema or change in skin lesion(s). Neuro: No weakness, tremor, incoordination, spasms, paresthesia or pain. Psychiatric: Denies confusion, memory loss or sensory loss. Endo: Denies change in weight, skin or hair change.  Heme/Lymph: No excessive bleeding, bruising or enlarged lymph nodes.  Physical Exam  BP 132/68   Temp 97.7 F (36.5 C)   Wt 171 lb (77.6 kg)   BMI 23.35 kg/m   Appears  well nourished, well groomed  and in no distress.  Eyes: PERRLA, EOMs, conjunctiva no swelling or  erythema. Sinuses: No frontal/maxillary tenderness ENT/Mouth: EAC's clear, TM's nl w/o erythema, bulging. Nares clear w/o erythema, swelling, exudates. Oropharynx clear without erythema or exudates. Oral hygiene is good. Tongue normal, non obstructing. Hearing intact.  Neck: Supple. Thyroid not palpable. Car 2+/2+ without bruits, nodes or JVD. Chest: Respirations nl with BS clear & equal w/o rales, rhonchi, wheezing or stridor.  Cor: Heart sounds normal w/ regular rate and rhythm without sig. murmurs, gallops, clicks or rubs. Peripheral pulses normal and equal  without edema.  Abdomen: Soft & bowel sounds normal. Non-tender w/o guarding, rebound, hernias, masses or organomegaly.  Lymphatics: Unremarkable.  Musculoskeletal: Full ROM all peripheral extremities, joint stability, 5/5 strength and normal gait.  Skin: Warm, dry without exposed rashes, lesions or ecchymosis  apparent.  Neuro: Cranial nerves intact, reflexes equal bilaterally. Sensory-motor testing grossly intact. Tendon reflexes grossly intact.  Pysch: Alert & oriented x 3.  Insight and judgement nl & appropriate. No ideations.  Assessment and Plan:  1. Essential hypertension  - Continue medication, monitor blood pressure at home.  - Continue DASH diet.  Reminder to go to the ER if any CP,  SOB, nausea, dizziness, severe HA, changes vision/speech.  - CBC with Differential/Platelet - COMPLETE METABOLIC PANEL WITH GFR - Magnesium - TSH  2. Hyperlipidemia associated with type 2 diabetes mellitus ([place])  - Continue diet/meds, exercise,& lifestyle modifications.  - Continue monitor periodic cholesterol/liver & renal functions   - Lipid panel - TSH  3. Type 2 diabetes mellitus with stage 2 chronic kidney  disease, without long-term current use of insulin ([facility])  - Continue diet, exercise  - Lifestyle modifications.  - Monitor appropriate labs  - Hemoglobin A1c - Insulin, random  4. Vitamin D deficiency  - Continue  supplementation.  - VITAMIN D 25 Hydroxyl  5. Hypothyroidism  - TSH  6. History of CVA with residual deficit   7. Medication management  - CBC with Differential/Platelet - COMPLETE METABOLIC PANEL WITH GFR - Magnesium - Lipid panel - TSH - Hemoglobin A1c - Insulin, random - VITAMIN D 25 Hydroxy         Discussed  regular exercise, BP monitoring, weight control to achieve/maintain BMI less than 25 and discussed med and SE's. Recommended labs to assess and monitor clinical status with further disposition pending results of labs.  I discussed the assessment and treatment plan with the patient. The patient was provided an opportunity to ask questions and all were answered. The patient agreed with the plan and demonstrated an understanding of the instructions.  I provided over 30 minutes of exam, counseling, chart review and  complex critical decision making.         The patient was advised to call back or seek an in-person evaluation if the symptoms worsen or if the condition fails to improve as anticipated.   Kirtland Bouchard, MD

## 2021-01-29 ENCOUNTER — Other Ambulatory Visit: Payer: Self-pay

## 2021-01-29 ENCOUNTER — Encounter: Payer: Self-pay | Admitting: Internal Medicine

## 2021-01-29 ENCOUNTER — Ambulatory Visit (INDEPENDENT_AMBULATORY_CARE_PROVIDER_SITE_OTHER): Payer: Medicare Other | Admitting: Internal Medicine

## 2021-01-29 VITALS — BP 132/68 | HR 68 | Temp 97.7°F | Ht 71.75 in | Wt 171.0 lb

## 2021-01-29 DIAGNOSIS — Z79899 Other long term (current) drug therapy: Secondary | ICD-10-CM | POA: Diagnosis not present

## 2021-01-29 DIAGNOSIS — E559 Vitamin D deficiency, unspecified: Secondary | ICD-10-CM | POA: Diagnosis not present

## 2021-01-29 DIAGNOSIS — E1169 Type 2 diabetes mellitus with other specified complication: Secondary | ICD-10-CM | POA: Diagnosis not present

## 2021-01-29 DIAGNOSIS — E039 Hypothyroidism, unspecified: Secondary | ICD-10-CM

## 2021-01-29 DIAGNOSIS — I700 Atherosclerosis of aorta: Secondary | ICD-10-CM

## 2021-01-29 DIAGNOSIS — E785 Hyperlipidemia, unspecified: Secondary | ICD-10-CM

## 2021-01-29 DIAGNOSIS — I6930 Unspecified sequelae of cerebral infarction: Secondary | ICD-10-CM

## 2021-01-29 DIAGNOSIS — N182 Chronic kidney disease, stage 2 (mild): Secondary | ICD-10-CM | POA: Diagnosis not present

## 2021-01-29 DIAGNOSIS — E1122 Type 2 diabetes mellitus with diabetic chronic kidney disease: Secondary | ICD-10-CM

## 2021-01-29 DIAGNOSIS — I10 Essential (primary) hypertension: Secondary | ICD-10-CM

## 2021-01-29 NOTE — Patient Instructions (Signed)
Due to recent changes in healthcare laws, you may see the results of your imaging and laboratory studies on MyChart before your provider has had a chance to review them.  We understand that in some cases there may be results that are confusing or concerning to you. Not all laboratory results come back in the same time frame and the provider may be waiting for multiple results in order to interpret others.  Please give us 48 hours in order for your provider to thoroughly review all the results before contacting the office for clarification of your results.     ++++++++++++++++++++++++++++++++++  Vit D  & Vit C 1,000 mg   are recommended to help protect  against the Covid-19 and other Corona viruses.    Also it's recommended  to take  Zinc 50 mg  to help  protect against the Covid-19   and best place to get  is also on [employer].com  and don't pay more than 6-8 cents /pill !   ===================================== Coronavirus (COVID-19) Are you at risk?  Are you at risk for the Coronavirus (COVID-19)?  To be considered HIGH RISK for Coronavirus (COVID-19), you have to meet the following criteria:  . Traveled to [place], [place], [place], [place] or [place]; or in the [place] to [place], [place], [place]  . or [place]; and have fever, cough, and shortness of breath within the last 2 weeks of travel OR . Been in close contact with a person diagnosed with COVID-19 within the last 2 weeks and have  . fever, cough,and shortness of breath .  . IF YOU DO NOT MEET THESE CRITERIA, YOU ARE CONSIDERED LOW RISK FOR COVID-19.  What to do if you are HIGH RISK for COVID-19?  . If you are having a medical emergency, call 911. . Seek medical care right away. Before you go to a doctor's office, urgent care or emergency department, .  call ahead and tell them about your recent travel, contact with someone diagnosed with COVID-19  .  and your symptoms.  . You should receive instructions  from your physician's office regarding next steps of care.  . When you arrive at healthcare provider, tell the healthcare staff immediately you have returned from  . visiting [place], [place], [place], [place] or [place]; or traveled in the [place] to [place], [place],  . [place] or [place] in the last two weeks or you have been in close contact with a person diagnosed with  . COVID-19 in the last 2 weeks.   . Tell the health care staff about your symptoms: fever, cough and shortness of breath. . After you have been seen by a medical provider, you will be either: o Tested for (COVID-19) and discharged home on quarantine except to seek medical care if  o symptoms worsen, and asked to  - Stay home and avoid contact with others until you get your results (4-5 days)  - Avoid travel on public transportation if possible (such as bus, train, or airplane) or o Sent to the Emergency Department by EMS for evaluation, COVID-19 testing  and  o possible admission depending on your condition and test results.  What to do if you are LOW RISK for COVID-19?  Reduce your risk of any infection by using the same precautions used for avoiding the common cold or flu:  . Wash your hands often with soap and warm water for at least 20 seconds.  If soap and water   are not readily available,  . use an alcohol-based hand sanitizer with at least 60% alcohol.  . If coughing or sneezing, cover your mouth and nose by coughing or sneezing into the elbow areas of your shirt or coat, .  into a tissue or into your sleeve (not your hands). . Avoid shaking hands with others and consider head nods or verbal greetings only. . Avoid touching your eyes, nose, or mouth with unwashed hands.  . Avoid close contact with people who are sick. . Avoid places or events with large numbers of people in one location, like concerts or sporting events. . Carefully consider travel plans you have or are making. . If you are planning  any travel outside or inside the [place], visit the CDC's Travelers' Health webpage for the latest health notices. . If you have some symptoms but not all symptoms, continue to monitor at home and seek medical attention  . if your symptoms worsen. . If you are having a medical emergency, call 911.   ++++++++++++++++++++++++++++++++ Recommend Adult Low Dose Aspirin or  coated  Aspirin 81 mg daily  To reduce risk of Colon Cancer 40 %,  Skin Cancer 26 % ,  Melanoma 46%  and  Pancreatic cancer 60% ++++++++++++++++++++++++++++++++ Vitamin D goal  is between 70-100.  Please make sure that you are taking your Vitamin D as directed.  It is very important as a natural anti-inflammatory  helping hair, skin, and nails, as well as reducing stroke and heart attack risk.  It helps your bones and helps with mood. It also decreases numerous cancer risks so please take it as directed.  Low Vit D is associated with a 200-300% higher risk for CANCER  and 200-300% higher risk for HEART   ATTACK  &  STROKE.   ...................................... It is also associated with higher death rate at younger ages,  autoimmune diseases like Rheumatoid arthritis, Lupus, Multiple Sclerosis.    Also many other serious conditions, like depression, Alzheimer's Dementia, infertility, muscle aches, fatigue, fibromyalgia - just to name a few. ++++++++++++++++++++ Recommend the book "The END of DIETING" by Dr Joel Fuhrman  & the book "The END of DIABETES " by Dr Joel Fuhrman At [employer].com - get book & Audio CD's    Being diabetic has a  300% increased risk for heart attack, stroke, cancer, and alzheimer- type vascular dementia. It is very important that you work harder with diet by avoiding all foods that are white. Avoid white rice (brown & wild rice is OK), white potatoes (sweetpotatoes in moderation is OK), White bread or wheat bread or anything made out of white flour like bagels, donuts, rolls, buns, biscuits, cakes,  pastries, cookies, pizza crust, and pasta (made from white flour & egg whites) - vegetarian pasta or spinach or wheat pasta is OK. Multigrain breads like Arnold's or Pepperidge Farm, or multigrain sandwich thins or flatbreads.  Diet, exercise and weight loss can reverse and cure diabetes in the early stages.  Diet, exercise and weight loss is very important in the control and prevention of complications of diabetes which affects every system in your body, ie. Brain - dementia/stroke, eyes - glaucoma/blindness, heart - heart attack/heart failure, kidneys - dialysis, stomach - gastric paralysis, intestines - malabsorption, nerves - severe painful neuritis, circulation - gangrene & loss of a leg(s), and finally cancer and Alzheimers.    I recommend avoid fried & greasy foods,  sweets/candy, white rice (brown or wild rice or Quinoa is OK), white potatoes (  sweet potatoes are OK) - anything made from white flour - bagels, doughnuts, rolls, buns, biscuits,white and wheat breads, pizza crust and traditional pasta made of white flour & egg white(vegetarian pasta or spinach or wheat pasta is OK).  Multi-grain bread is OK - like multi-grain flat bread or sandwich thins. Avoid alcohol in excess. Exercise is also important.    Eat all the vegetables you want - avoid meat, especially red meat and dairy - especially cheese.  Cheese is the most concentrated form of trans-fats which is the worst thing to clog up our arteries. Veggie cheese is OK which can be found in the fresh produce section at [employer] or [employer] or [employer]  +++++++++++++++++++++ DASH Eating Plan  DASH stands for "Dietary Approaches to Stop Hypertension."   The DASH eating plan is a healthy eating plan that has been shown to reduce high blood pressure (hypertension). Additional health benefits may include reducing the risk of type 2 diabetes mellitus, heart disease, and stroke. The DASH eating plan may also help with weight loss. WHAT DO I  NEED TO KNOW ABOUT THE DASH EATING PLAN? For the DASH eating plan, you will follow these general guidelines:  Choose foods with a percent daily value for sodium of less than 5% (as listed on the food label).  Use salt-free seasonings or herbs instead of table salt or sea salt.  Check with your health care provider or pharmacist before using salt substitutes.  Eat lower-sodium products, often labeled as "lower sodium" or "no salt added."  Eat fresh foods.  Eat more vegetables, fruits, and low-fat dairy products.  Choose whole grains. Look for the word "whole" as the first word in the ingredient list.  Choose fish   Limit sweets, desserts, sugars, and sugary drinks.  Choose heart-healthy fats.  Eat veggie cheese   Eat more home-cooked food and less restaurant, buffet, and fast food.  Limit fried foods.  Cook foods using methods other than frying.  Limit canned vegetables. If you do use them, rinse them well to decrease the sodium.  When eating at a restaurant, ask that your food be prepared with less salt, or no salt if possible.                      WHAT FOODS CAN I EAT? Read Dr Joel Fuhrman's books on The End of Dieting & The End of Diabetes  Grains Whole grain or whole wheat bread. Brown rice. Whole grain or whole wheat pasta. Quinoa, bulgur, and whole grain cereals. Low-sodium cereals. Corn or whole wheat flour tortillas. Whole grain cornbread. Whole grain crackers. Low-sodium crackers.  Vegetables Fresh or frozen vegetables (raw, steamed, roasted, or grilled). Low-sodium or reduced-sodium tomato and vegetable juices. Low-sodium or reduced-sodium tomato sauce and paste. Low-sodium or reduced-sodium canned vegetables.   Fruits All fresh, canned (in natural juice), or frozen fruits.  Protein Products  All fish and seafood.  Dried beans, peas, or lentils. Unsalted nuts and seeds. Unsalted canned beans.  Dairy Low-fat dairy products, such as skim or 1% milk, 2% or  reduced-fat cheeses, low-fat ricotta or cottage cheese, or plain low-fat yogurt. Low-sodium or reduced-sodium cheeses.  Fats and Oils Tub margarines without trans fats. Light or reduced-fat mayonnaise and salad dressings (reduced sodium). Avocado. Safflower, olive, or canola oils. Natural peanut or almond butter.  Other Unsalted popcorn and pretzels. The items listed above may not be a complete list of recommended foods or beverages. Contact your dietitian for more   options.  +++++++++++++++  WHAT FOODS ARE NOT RECOMMENDED? Grains/ White flour or wheat flour White bread. White pasta. White rice. Refined cornbread. Bagels and croissants. Crackers that contain trans fat.  Vegetables  Creamed or fried vegetables. Vegetables in a . Regular canned vegetables. Regular canned tomato sauce and paste. Regular tomato and vegetable juices.  Fruits Dried fruits. Canned fruit in light or heavy syrup. Fruit juice.  Meat and Other Protein Products Meat in general - RED meat & White meat.  Fatty cuts of meat. Ribs, chicken wings, all processed meats as bacon, sausage, bologna, salami, fatback, hot dogs, bratwurst and packaged luncheon meats.  Dairy Whole or 2% milk, cream, half-and-half, and cream cheese. Whole-fat or sweetened yogurt. Full-fat cheeses or blue cheese. Non-dairy creamers and whipped toppings. Processed cheese, cheese spreads, or cheese curds.  Condiments Onion and garlic salt, seasoned salt, table salt, and sea salt. Canned and packaged gravies. Worcestershire sauce. Tartar sauce. Barbecue sauce. Teriyaki sauce. Soy sauce, including reduced sodium. Steak sauce. Fish sauce. Oyster sauce. Cocktail sauce. Horseradish. Ketchup and mustard. Meat flavorings and tenderizers. Bouillon cubes. Hot sauce. Tabasco sauce. Marinades. Taco seasonings. Relishes.  Fats and Oils Butter, stick margarine, lard, shortening and bacon fat. Coconut, palm kernel, or palm oils. Regular salad  dressings.  Pickles and olives. Salted popcorn and pretzels.  The items listed above may not be a complete list of foods and beverages to avoid.   

## 2021-01-30 NOTE — Progress Notes (Signed)
============================================================ -   Test results slightly outside the reference range are not unusual. If there is anything important, I will review this with you,  otherwise it is considered normal test values.  If you have further questions,  please do not hesitate to contact me at the office or via My Chart.  ============================================================ ============================================================  -  A1c = 6.1% - still too high      (Ideal or Goal is less than 5.7%)  - Avoid Sweets, Candy & White Stuff   - Rice, Potatoes, Breads &  Pasta ============================================================ ============================================================  -  Vitamin D = 81 - Excellent  ============================================================ ============================================================  -  All Else - CBC - Kidneys - Electrolytes - Liver - Magnesium & Thyroid    - all  Normal / OK ============================================================ ============================================================

## 2021-02-01 ENCOUNTER — Other Ambulatory Visit: Payer: Self-pay | Admitting: Adult Health Nurse Practitioner

## 2021-02-01 LAB — COMPLETE METABOLIC PANEL WITH GFR
AG Ratio: 1.9 (calc) (ref 1.0–2.5)
ALT: 23 U/L (ref 9–46)
AST: 20 U/L (ref 10–35)
Albumin: 4.2 g/dL (ref 3.6–5.1)
Alkaline phosphatase (APISO): 61 U/L (ref 35–144)
BUN: 21 mg/dL (ref 7–25)
CO2: 29 mmol/L (ref 20–32)
Calcium: 9.6 mg/dL (ref 8.6–10.3)
Chloride: 105 mmol/L (ref 98–110)
Creat: 1.16 mg/dL (ref 0.70–1.18)
GFR, Est African American: 69 mL/min/{1.73_m2} (ref >=60)
GFR, Est Non African American: 60 mL/min/{1.73_m2} (ref >=60)
Globulin: 2.2 g/dL (calc) (ref 1.9–3.7)
Glucose, Bld: 138 mg/dL — ABNORMAL HIGH (ref 65–99)
Potassium: 4.6 mmol/L (ref 3.5–5.3)
Sodium: 143 mmol/L (ref 135–146)
Total Bilirubin: 0.8 mg/dL (ref 0.2–1.2)
Total Protein: 6.4 g/dL (ref 6.1–8.1)

## 2021-02-01 LAB — CBC WITH DIFFERENTIAL/PLATELET
Absolute Monocytes: 416 cells/uL (ref 200–950)
Basophils Absolute: 52 cells/uL (ref 0–200)
Basophils Relative: 1 %
Eosinophils Absolute: 218 cells/uL (ref 15–500)
Eosinophils Relative: 4.2 %
HCT: 37.3 % — ABNORMAL LOW (ref 38.5–50.0)
Hemoglobin: 12.8 g/dL — ABNORMAL LOW (ref 13.2–17.1)
Lymphs Abs: 2018 cells/uL (ref 850–3900)
MCH: 29.7 pg (ref 27.0–33.0)
MCHC: 34.3 g/dL (ref 32.0–36.0)
MCV: 86.5 fL (ref 80.0–100.0)
MPV: 10.8 fL (ref 7.5–12.5)
Monocytes Relative: 8 %
Neutro Abs: 2496 cells/uL (ref 1500–7800)
Neutrophils Relative %: 48 %
Platelets: 192 10*3/uL (ref 140–400)
RBC: 4.31 10*6/uL (ref 4.20–5.80)
RDW: 12.9 % (ref 11.0–15.0)
Total Lymphocyte: 38.8 %
WBC: 5.2 10*3/uL (ref 3.8–10.8)

## 2021-02-01 LAB — HEMOGLOBIN A1C
Hgb A1c MFr Bld: 6.1 % of total Hgb — ABNORMAL HIGH (ref <5.7)
Mean Plasma Glucose: 128 mg/dL
eAG (mmol/L): 7.1 mmol/L

## 2021-02-01 LAB — LIPID PANEL
Cholesterol: 120 mg/dL (ref <200)
HDL: 62 mg/dL (ref >=40)
LDL Cholesterol (Calc): 45 mg/dL (calc)
Non-HDL Cholesterol (Calc): 58 mg/dL (calc) (ref <130)
Total CHOL/HDL Ratio: 1.9 (calc) (ref <5.0)
Triglycerides: 47 mg/dL (ref <150)

## 2021-02-01 LAB — INSULIN, RANDOM: Insulin: 23.1 u[IU]/mL — ABNORMAL HIGH

## 2021-02-01 LAB — VITAMIN D 25 HYDROXY (VIT D DEFICIENCY, FRACTURES): Vit D, 25-Hydroxy: 81 ng/mL (ref 30–100)

## 2021-02-01 LAB — TSH: TSH: 0.86 mIU/L (ref 0.40–4.50)

## 2021-02-01 LAB — MAGNESIUM: Magnesium: 2 mg/dL (ref 1.5–2.5)

## 2021-02-02 NOTE — Progress Notes (Signed)
[facility] Summary Report / Loop Recorder 

## 2021-02-04 ENCOUNTER — Other Ambulatory Visit: Payer: Self-pay | Admitting: Internal Medicine

## 2021-02-04 MED ORDER — ATORVASTATIN CALCIUM 40 MG PO TABS
ORAL_TABLET | ORAL | 0 refills | Status: DC
Start: 2021-02-04 — End: 2021-04-07

## 2021-02-08 ENCOUNTER — Other Ambulatory Visit: Payer: Self-pay | Admitting: Internal Medicine

## 2021-02-08 DIAGNOSIS — E1140 Type 2 diabetes mellitus with diabetic neuropathy, unspecified: Secondary | ICD-10-CM

## 2021-02-08 MED ORDER — GABAPENTIN 300 MG PO CAPS: ORAL_CAPSULE | ORAL | 1 refills | Status: DC

## 2021-02-22 ENCOUNTER — Ambulatory Visit (INDEPENDENT_AMBULATORY_CARE_PROVIDER_SITE_OTHER): Payer: Medicare Other

## 2021-02-22 DIAGNOSIS — I480 Paroxysmal atrial fibrillation: Secondary | ICD-10-CM | POA: Diagnosis not present

## 2021-02-23 LAB — CUP PACEART REMOTE DEVICE CHECK
Date Time Interrogation Session: 20220404234142
Implantable Pulse Generator Implant Date: 20180907

## 2021-03-04 NOTE — Progress Notes (Signed)
[facility] Summary Report / Loop Recorder 

## 2021-03-25 ENCOUNTER — Ambulatory Visit: Payer: Medicare Other | Admitting: Adult Health Nurse Practitioner

## 2021-03-29 ENCOUNTER — Telehealth: Payer: Self-pay | Admitting: Emergency Medicine

## 2021-03-29 LAB — CUP PACEART REMOTE DEVICE CHECK
Date Time Interrogation Session: 20220507235635
Implantable Pulse Generator Implant Date: 20180907

## 2021-03-29 NOTE — Telephone Encounter (Signed)
LMOM(DPR) that loop recorder is at [facility] and to call device clinic. DC # and office hours provided.  Give option for extraction in office by Dr Caryl Comes.p

## 2021-03-29 NOTE — Telephone Encounter (Signed)
Patient informed that loop recorder  is at [facility]. Instructed to unplug home remote monitor and informed a return kit was ordered to return the home monitor to [employer]. Explained that he has the option to leave the device in place or have it extracted by Dr Caryl Comes in the office. Patient would like to be scheduled for loop extraction by Dr Caryl Comes. He will expect a call from a scheduler to be placed on Dr Olin Pia schedule for extraction of the device.

## 2021-04-07 ENCOUNTER — Other Ambulatory Visit: Payer: Self-pay | Admitting: Internal Medicine

## 2021-04-17 ENCOUNTER — Other Ambulatory Visit: Payer: Self-pay | Admitting: Internal Medicine

## 2021-04-22 ENCOUNTER — Encounter: Payer: Medicare Other | Attending: Physical Medicine & Rehabilitation | Admitting: Physical Medicine & Rehabilitation

## 2021-04-22 ENCOUNTER — Encounter: Payer: Self-pay | Admitting: Physical Medicine & Rehabilitation

## 2021-04-22 ENCOUNTER — Other Ambulatory Visit: Payer: Self-pay

## 2021-04-22 VITALS — BP 142/86 | HR 100 | Temp 98.3°F | Ht 71.0 in | Wt 178.5 lb

## 2021-04-22 DIAGNOSIS — I69393 Ataxia following cerebral infarction: Secondary | ICD-10-CM | POA: Diagnosis not present

## 2021-04-22 DIAGNOSIS — I6930 Unspecified sequelae of cerebral infarction: Secondary | ICD-10-CM | POA: Diagnosis not present

## 2021-04-22 DIAGNOSIS — R42 Dizziness and giddiness: Secondary | ICD-10-CM | POA: Diagnosis not present

## 2021-04-22 DIAGNOSIS — R269 Unspecified abnormalities of gait and mobility: Secondary | ICD-10-CM | POA: Diagnosis not present

## 2021-04-22 NOTE — Progress Notes (Signed)
Subjective:    Patient ID: Earl [name], male    DOB: [birth year], 80 y.o.   MRN: [id number]   HPI Male with history of  HTN, questionable history PAF, T2DM presents for follow up for cerebellar stroke.   Last clinic visit 05/12/20.  Since that time, pt states describes ortho stasis. He is doing HEP. Denies falls. He notes deficits with balance. Denies falls.    Pain Inventory Average Pain ~2-3 Pain Right Now 0 My pain is no pain at this moment  In the last 24 hours, has pain interfered with the following? General activity 0 Relation with others 0 Enjoyment of life 0 What TIME of day is your pain at its worst? no pain Sleep (in general) Fair  Pain is worse with: walking Pain improves with: medication Relief from Meds: 1  Mobility walk with assistance use a cane ability to climb steps?  yes do you drive?  yes  Function retired  Neuro/Psych trouble walking dizziness depression  Prior Studies Any changes since last visit?  no CT/MRI  Physicians involved in your care Any changes since last visit?  no   Family History  Problem Relation Age of Onset  . Sickle cell anemia Mother   . Diabetes Father   . Stomach cancer Father   . Colon cancer Father   . CAD Neg Hx   . Esophageal cancer Neg Hx   . Rectal cancer Neg Hx    Social History   Socioeconomic History  . Marital status: Widowed    Spouse name: Not on file  . Number of children: 2  . Years of education: Not on file  . Highest education level: Not on file  Occupational History  . Occupation: Herbalist: RETIRED  Tobacco Use  . Smoking status: Never Smoker  . Smokeless tobacco: Never Used  Vaping Use  . Vaping Use: Never used  Substance and Sexual Activity  . Alcohol use: No  . Drug use: No  . Sexual activity: Not on file  Other Topics Concern  . Not on file  Social History Narrative  . Not on file   Social Determinants of Health   Financial  Resource Strain: Not on file  Food Insecurity: Not on file  Transportation Needs: Not on file  Physical Activity: Not on file  Stress: Not on file  Social Connections: Not on file   Past Surgical History:  Procedure Laterality Date  . CIRCUMCISION N/A 06/11/2019   Procedure: CIRCUMCISION ADULT;  Surgeon: [name], MD;  Location: [facility];  Service: Urology;  Laterality: N/A;  . COLONOSCOPY    . INGUINAL HERNIA REPAIR Bilateral    1968 and 2004  . LOOP RECORDER INSERTION N/A 07/28/2017   Procedure: LOOP RECORDER INSERTION;  Surgeon: Deboraha Sprang, MD;  Location: [facility];  Service: Cardiovascular;  Laterality: N/A;  . TEE WITHOUT CARDIOVERSION N/A 07/28/2017   Procedure: TRANSESOPHAGEAL ECHOCARDIOGRAM (TEE);  Surgeon: Sueanne Margarita, MD;  Location: [facility] ENDOSCOPY;  Service: Cardiovascular;  Laterality: N/A;   Past Medical History:  Diagnosis Date  . Adenomatous colon polyp   . Allergy   . Anal fissure   . Arthritis   . BPH (benign prostatic hyperplasia)   . Diabetes mellitus ([place])   . GERD (gastroesophageal reflux disease)   . Hyperlipidemia   . Hypertension   . Hypothyroidism   . Other testicular hypofunction   . Stroke ([place])    BP (!) 142/86  Pulse 100   Temp 98.3 F (36.8 C)   Ht 5\' 11"  (1.803 m)   Wt 178 lb 8 oz (81 kg)   SpO2 98%   BMI 24.90 kg/m   Opioid Risk Score:   Fall Risk Score:  `1  Depression screen PHQ 2/9  Depression screen [facility] 2/9 04/22/2021 07/12/2020 05/12/2020 12/15/2019 06/08/2019 03/05/2019 01/22/2019  Decreased Interest 1 0 3 0 0 0 0  Down, Depressed, Hopeless 1 0 0 0 0 0 0  PHQ - 2 Score 2 0 3 0 0 0 0  Altered sleeping - - 1 - - - -  Tired, decreased energy - - 1 - - - -  Change in appetite - - 0 - - - -  Feeling bad or failure about yourself  - - 0 - - - -  Trouble concentrating - - 0 - - - -  Moving slowly or fidgety/restless - - 0 - - - -  Suicidal thoughts - - 0 - - - -  PHQ-9 Score - - 5 - - - -  Difficult doing work/chores  - - - - - - -  Some recent data might be hidden    Review of Systems  Constitutional: Negative.   HENT: Negative.   Eyes: Negative.   Respiratory: Negative.   Cardiovascular: Negative.   Gastrointestinal: Negative.   Endocrine: Negative.   Genitourinary: Negative.   Musculoskeletal: Positive for gait problem.       Right leg  Skin: Negative.   Allergic/Immunologic: Negative.   Neurological: Positive for weakness.       Dizziness when standing from a sitting position    Hematological: Negative.   Psychiatric/Behavioral: Negative.        Depression - wife died.       Objective:   Physical Exam  Constitutional: No distress . Vital signs reviewed. HENT: Normocephalic.  Atraumatic. Eyes: EOMI. No discharge. Cardiovascular: No JVD.   Respiratory: Normal effort.  No stridor.   GI: Non-distended.   Skin: Warm and dry.  Intact. Psych: Normal mood.  Normal behavior. Musc: No edema in extremities.  No tenderness in extremities. Gait: Slow cadence, slightly wide based, and ataxic Neurological: Alert Mild ataxia RUE Speech clear  Motor: Grossly 5/5 throughout  Assessment & Plan:  [place] history of HTN, questionable history PAF, T2DM presents for follow up for cerebellar stroke.   1.  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 Gait abnormality - balance deficits  Encouraged HEP             Continue cane for safety  Not interested in PT at present  3. Orthostasis - improving             Reminded slow positional changed             Cont appropriate hydration             Encouraged TEDS if necessary             Cont follow Cards

## 2021-05-06 DIAGNOSIS — Z9889 Other specified postprocedural states: Secondary | ICD-10-CM | POA: Insufficient documentation

## 2021-05-06 NOTE — Progress Notes (Signed)
Patient ID: Earl [name], male   DOB: February 21, 1941, 80 y.o.   MRN: [id number]      Patient Care Team: [name], MD as PCP - General (Internal Medicine) Deboraha Sprang, MD as PCP - Cardiology (Cardiology) Deboraha Sprang, MD as PCP - Electrophysiology (Cardiology) Inda Castle, MD (Inactive) as Consulting Physician (Gastroenterology) Wyatt Portela, MD as Consulting Physician (Oncology) Sharyne Peach, MD as Consulting Physician (Ophthalmology)   HPI  Earl [name] is a 80 y.o. 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   Atrial fibrillation was identified and he takes apixaban     Removal of Device done today  The patient denies chest pain, shortness of breath, nocturnal dyspnea, orthopnea.  There have been no palpitations, lightheadedness or syncope.  Complains of He have notice peripheral edema for approx.1 year now :  .  Notes he urinates frequently with his dose of diuretic    Wife deceased 2023/07/24 survived by 2 children Her name was Earl [name] they met at [facility]   Date Cr K Hgb  1/20 1.18 3.9 12.7  11/21  0.99 4.3 13.0  3/22 1.16 4.6 12.8    DATE TEST EF   9/18  TEE 55-65% No CSE  9/18 Echo   55-60 %          Past Medical History:  Diagnosis Date   Adenomatous colon polyp    Allergy    Anal fissure    Arthritis    BPH (benign prostatic hyperplasia)    Diabetes mellitus ([facility])    GERD (gastroesophageal reflux disease)    Hyperlipidemia    Hypertension    Hypothyroidism    Other testicular hypofunction    Stroke [facility])     Past Surgical History:  Procedure Laterality Date   CIRCUMCISION N/A 06/11/2019   Procedure: CIRCUMCISION ADULT;  Surgeon: [name], MD;  Location: [facility];  Service: Urology;  Laterality: N/A;   COLONOSCOPY     INGUINAL HERNIA REPAIR Bilateral    1968 and 2004   LOOP RECORDER INSERTION N/A 07/28/2017   Procedure: LOOP RECORDER INSERTION;  Surgeon: Deboraha Sprang, MD;   Location: [place] [facility];  Service: Cardiovascular;  Laterality: N/A;   TEE WITHOUT CARDIOVERSION N/A 07/28/2017   Procedure: TRANSESOPHAGEAL ECHOCARDIOGRAM (TEE);  Surgeon: Sueanne Margarita, MD;  Location: [facility] ENDOSCOPY;  Service: Cardiovascular;  Laterality: N/A;    Current Meds  Medication Sig   atorvastatin (LIPITOR) 40 MG tablet TAKE 1 TABLET BY MOUTH  DAILY FOR CHOLESTEROL   Blood Glucose Monitoring Suppl (ONE TOUCH ULTRA 2) w/Device KIT Check blood sugar 1 time a day-DX-E11.9   Cholecalciferol (VITAMIN D PO) Take 1 tablet by mouth daily.    Cyanocobalamin (B-12) 50 MCG TABS Take 50 mcg by mouth daily.   ELIQUIS 5 MG TABS tablet TAKE 1 TABLET BY MOUTH  TWICE DAILY TO PREVENT  BLOOD CLOTS   finasteride (PROSCAR) 5 MG tablet TAKE 1 TABLET BY MOUTH  DAILY FOR PROSTATE   gabapentin (NEURONTIN) 300 MG capsule Take 1 to 2 capsules 1 hour before Bedtime for Diabetic Neuropathy Pains   glipiZIDE (GLUCOTROL) 5 MG tablet Take  1 tablet  2 x /day  with Meals  for Diabetes   glucose blood (ONETOUCH ULTRA) test strip CHECK BLOOD SUGAR ONCE A DAY.   Lancets (ONETOUCH DELICA PLUS [id number]) MISC CHECK BLOOD SUGAR ONCE A DAY.   Lancets Misc. (ONE TOUCH SURESOFT) MISC Check blood sugar 1 time  a day-DX-E11.91   levothyroxine (SYNTHROID) 50 MCG tablet TAKE 1 TABLET BY MOUTH DAILY IN THE MORNING ON AN EMPTY STOMACH WITH ONLY WATER 30 MINUTES BEFORE BREAKFAST AND NO ANTACID MEDS, CALCIUM OR MAGNESIUM FOR 4 HOURS AND AVOID BIOTIN   Magnesium 250 MG TABS Take 250 mg by mouth daily.    metFORMIN (GLUCOPHAGE-XR) 500 MG 24 hr tablet TAKE 2 TABLETS BY MOUTH 2  TIMES DAILY WITH MEALS FOR  DIABETES   Multiple Vitamin (MULTIVITAMIN) capsule Take 1 capsule by mouth daily.   triamcinolone ointment (KENALOG) 0.1 % Apply topically 2 (two) times daily.   vitamin C (ASCORBIC ACID) 500 MG tablet Take 500 mg by mouth daily.    Allergies  Allergen Reactions   Penicillins Other (See Comments)    Physical Exam: BP  134/76   Pulse 96   Ht $R'5\' 11"'KY$  (1.803 m)   Wt 173 lb 9.6 oz (78.7 kg)   SpO2 99%   BMI 24.21 kg/m  Well developed and nourished in no acute distress HENT normal Neck supple with JVP-  flat  Clear Regular rate and rhythm, no murmurs or gallops Abd-soft with active BS No Clubbing cyanosis 1=edemaedema Skin-warm and dry A & Oriented  Grossly normal sensory and motor function        EKG: Sinus at 96 Interval 21/08/38   Assessment and  Plan  ESUS  Dizziness  Atrial fibrillation  HTN  Orthostatic hypertension   No interval neurological events.  We will continue him on apixaban 5 mg twice daily.  Mildly volume overloaded.  We will have him increase his furosemide from 40 daily to 40 twice daily for 3 days  Blood pressure is well controlled.  Will reach out to his primary care physician regarding the addition of an ARB for renal protection in the context of his diabetes would recommend losartan 25  No interval atrial fibrillation which he is aware.  Continue with Eliquis.  For loop removal     I,[name],acting as a scribe for [name], MD.,have documented all relevant documentation on the behalf of [name], MD,as directed by  [name], MD while in the presence of [name], MD.  I, [name], MD, have reviewed all documentation for this visit. The documentation on 05/07/21 for the exam, diagnosis, procedures, and orders are all accurate and complete.    Earl [name] [id number]  [id number]  Preop Dx:.Cryptogenic Stroke with previous loop Postop Dx same/  Procedure:loop explant  Following the administration of local anesthesia a small incision was made the proximal end of the device.  Blunt dissection allowed for capture of the device and its retrieval.  Benzoin Steri-Strip dressing was applied.       [name], MD 05/07/2021 9:04 AM

## 2021-05-07 ENCOUNTER — Ambulatory Visit (INDEPENDENT_AMBULATORY_CARE_PROVIDER_SITE_OTHER): Payer: Medicare Other | Admitting: Internal Medicine

## 2021-05-07 ENCOUNTER — Other Ambulatory Visit: Payer: Self-pay

## 2021-05-07 VITALS — BP 134/76 | HR 96 | Ht 71.0 in | Wt 173.6 lb

## 2021-05-07 DIAGNOSIS — Z9889 Other specified postprocedural states: Secondary | ICD-10-CM

## 2021-05-07 DIAGNOSIS — I480 Paroxysmal atrial fibrillation: Secondary | ICD-10-CM | POA: Diagnosis not present

## 2021-05-07 NOTE — Patient Instructions (Signed)
Medication Instructions:  Your physician recommends that you continue on your current medications as directed. Please refer to the Current Medication list given to you today.  *If you need a refill on your cardiac medications before your next appointment, please call your pharmacy*   Lab Work: None ordered.  If you have labs (blood work) drawn today and your tests are completely normal, you will receive your results only by: [place] (if you have MyChart) OR A paper copy in the mail If you have any lab test that is abnormal or we need to change your treatment, we will call you to review the results.   Testing/Procedures: None ordered.    Follow-Up: At [facility], you and your health needs are our priority.  As part of our continuing mission to provide you with exceptional heart care, we have created designated Provider Care Teams.  These Care Teams include your primary Cardiologist (physician) and Advanced Practice Providers (APPs -  Physician Assistants and Nurse Practitioners) who all work together to provide you with the care you need, when you need it.  We recommend signing up for the patient portal called "MyChart".  Sign up information is provided on this After Visit Summary.  MyChart is used to connect with patients for Virtual Visits (Telemedicine).  Patients are able to view lab/test results, encounter notes, upcoming appointments, etc.  Non-urgent messages can be sent to your provider as well.   To learn more about what you can do with MyChart, go to [email or web address].    Your next appointment:   12 month(s)  The format for your next appointment:   In Person  Provider:   [name], MD   Other Instructions You may shower tomorrow with the large bandage on.  You may remove the large clear bandage in 5 days.  Allow the steri-strips to come off on their own.  Do not pull them off.  If you see some bleeding through your bandage place pressure on the  site with 2 fingers for 10 minutes and this should stop any oozing of blood.

## 2021-05-13 ENCOUNTER — Ambulatory Visit: Payer: Medicare Other | Admitting: Physical Medicine & Rehabilitation

## 2021-05-14 NOTE — Progress Notes (Addendum)
MEDICARE ANNUAL WELLNESS VISIT AND FOLLOW UP Assessment:   Encounter for Medicare annual wellness exam 1 year  Paroxysmal atrial fibrillation ([place]) Continue medications, rate controlled, follow up cardio  Abdominal aortic atherosclerosis ([place])  Control blood pressure, cholesterol, glucose, increase exercise.   Hyperlipidemia associated with type 2 diabetes mellitus (HCC) -     Lipid panel check lipids decrease fatty foods increase activity.   Type 2 diabetes mellitus with stage 2 chronic kidney disease, without long-term current use of insulin (HCC) -     Hemoglobin A1c Discussed general issues about diabetes pathophysiology and management., Educational material distributed., Suggested low cholesterol diet., Encouraged aerobic exercise., Discussed foot care., Reminded to get yearly retinal exam.  CKD stage 2 due to type 2 diabetes mellitus ([place]) -     Hemoglobin A1c Consider ACEi/ARB - deferring due to hypotension Continue to push fluids, control glucose  Acquired thrombophilia (HCC) Continue eliquis -     CBC with Differential/Platelet  Hypothyroidism, unspecified type -     TSH Hypothyroidism-check TSH level, continue medications the same, reminded to take on an empty stomach 30-61mns before food.   MGUS (monoclonal gammopathy of unknown significance) Hem/onc follows  Essential hypertension -     CBC with Differential/Platelet -     COMPLETE METABOLIC PANEL WITH GFR -     TSH - continue medications, DASH diet, exercise and monitor at home. Call if greater than 130/80.  - will reach out to Dr. KCaryl Comesre: recommendations for losartan 25 mg with risk of hypotension and increased fall risk, per review today I am not sure benefit outweighs risk for him at this time - if able to reduce lasix may be able to consider - will defer to Dr. KCaryl Comeson this  History of CVA with residual deficit Monitor  Medication management -     Magnesium  Vitamin D deficiency -     VITAMIN  D 25 Hydroxy (Vit-D Deficiency, Fractures)  Late effect of cerebrovascular accident (CVA) Monitor  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, no falls  Moderate fall risk Due to gait/ataxia; fall prevention discussed at length Denies needs in home at this time  Over 30 minutes of exam, counseling, chart review, and critical decision making was performed  Future Appointments  Date Time Provider [place] 09/10/2021 11:00 AM MUnk Pinto MD [facility] None  12/22/2021 10:00 AM [facility] None  12/29/2021 10:30 AM SWyatt Portela MD [facility] None  05/17/2022 11:00 AM CLiane Comber NP [facility] None     Plan:   During the course of the visit the patient was educated and counseled about appropriate screening and preventive services including:   Pneumococcal vaccine  Influenza vaccine Prevnar 13 Td vaccine Screening electrocardiogram Colorectal cancer screening Diabetes screening Glaucoma screening Nutrition counseling    Subjective:  NYuriel Lopezmartinezis a 80y.o. male who presents for Medicare Annual Wellness Visit and 3 month follow up for HTN, hyperlipidemia DMII, CKD, atrial fibrillation, BPH, and vitamin D Def.    Wife JBlanch Mediapassed earlier this year, had been married 532years. His daughter [name] stays with him twice a week, Son also checks in on him. He is doing better and stopped short term xanax.  He ambulates with cane.   He reports thickened deviated toe nails and neuropathic pains in feet; bil but L is bothering him more. He is requesting referral to podiatry.   BMI  is Body mass index is 24.13 kg/m., he is working on diet and exercise[facility] 1.5 hours 3 days a week.  Wt Readings from Last 3 Encounters:  05/17/21 173 lb (78.5 kg)  05/07/21 173 lb 9.6 oz (78.7 kg)  04/22/21 178 lb 8 oz (81 kg)   He also follows with Dr Alen Blew for  MGUS.  He also has history of CVA in 2018, Rt cerebellar thrombotic and placed on Eliquis for Afib, follows with Dr. Caryl Comes.  Last ECHO 2018 with moderate LVH, EF 55-60%. He uses a cane for gait stability for right ataxia sp CVA. He did follow with speeth therapy with sigificant improvement, reports dysphagia has improved/resolved.    His blood pressure has been controlled at home, today their BP is BP: 126/68 He does workout. He denies chest pain, shortness of breath, dizziness.   Per recent cardiology note losartan 25 mg was suggested for diabetic renal protection. He was previously on lisinopril low dose 5 mg, however this was stopped due to hypotension/dizziness at home, reports improved sx. Reviewed 10 year renal labs, appear has been fairly stable without significant trend down. His diabetes has been very well controlled.   He has abdominal aortic atherosclerosis per CT 2016.   He is on cholesterol medication and denies myalgias. His cholesterol is at goal. The cholesterol last visit was:   Lab Results  Component Value Date   CHOL 120 01/29/2021   HDL 62 01/29/2021   LDLCALC 45 01/29/2021   TRIG 47 01/29/2021   CHOLHDL 1.9 01/29/2021   He has not been working on diet and exercise for T2 diabetes, on metformin and glipizide 5 mg BID, on eliquis, statin and denies hyperglycemia, hypoglycemia , nausea, polydipsia, and polyuria. He does report burning in bil feet at night, does take gabapentin 600 mg at HS for this. Last A1C in the office was:  Lab Results  Component Value Date   HGBA1C 6.1 (H) 01/29/2021    He has CKD II associated with T2DM monitored at this office. Not on ACEi due to hypotension. Last GFR:  Lab Results  Component Value Date   GFRAA 69 01/29/2021   Patient is on Vitamin D supplement.   Lab Results  Component Value Date   VD25OH 28 01/29/2021     He is on thyroid medication. His medication was not changed last visit. 50 mcg, takes 1 tab Sun/Tues/Thus, 1/2 tab all  others.  Lab Results  Component Value Date   TSH 0.86 01/29/2021  .     Medication Review:  Current Outpatient Medications (Endocrine & Metabolic):    glipiZIDE (GLUCOTROL) 5 MG tablet, Take  1 tablet  2 x /day  with Meals  for Diabetes   metFORMIN (GLUCOPHAGE-XR) 500 MG 24 hr tablet, TAKE 2 TABLETS BY MOUTH 2  TIMES DAILY WITH MEALS FOR  DIABETES   levothyroxine (SYNTHROID) 50 MCG tablet, TAKE WHOLE TAB SUN/TUES/THURS, TAKE 1/2 TAB ALL OTHER DAYS. TAKE IN THE MORNING ON AN EMPTY STOMACH WITH ONLY WATER 30 MINUTES BEFORE BREAKFAST AND NO ANTACID MEDS, CALCIUM OR MAGNESIUM FOR 4 HOURS AND AVOID BIOTIN  Current Outpatient Medications (Cardiovascular):    atorvastatin (LIPITOR) 40 MG tablet, TAKE 1 TABLET BY MOUTH  DAILY FOR CHOLESTEROL   furosemide (LASIX) 40 MG tablet, Take 1 tablet (40 mg total) by mouth daily.  Current Outpatient Medications (Respiratory):    sodium chloride (OCEAN) 0.65 % nasal spray, Place 2 sprays into the nose as needed  for congestion.   Current Outpatient Medications (Hematological):    Cyanocobalamin (B-12) 50 MCG TABS, Take 50 mcg by mouth daily.   ELIQUIS 5 MG TABS tablet, TAKE 1 TABLET BY MOUTH  TWICE DAILY TO PREVENT  BLOOD CLOTS  Current Outpatient Medications (Other):    Blood Glucose Monitoring Suppl (ONE TOUCH ULTRA 2) w/Device KIT, Check blood sugar 1 time a day-DX-E11.9   Cholecalciferol (VITAMIN D PO), Take 1 tablet by mouth daily.    finasteride (PROSCAR) 5 MG tablet, TAKE 1 TABLET BY MOUTH  DAILY FOR PROSTATE   gabapentin (NEURONTIN) 300 MG capsule, Take 1 to 2 capsules 1 hour before Bedtime for Diabetic Neuropathy Pains   glucose blood (ONETOUCH ULTRA) test strip, CHECK BLOOD SUGAR ONCE A DAY.   Lancets (ONETOUCH DELICA PLUS [id number]) MISC, CHECK BLOOD SUGAR ONCE A DAY.   Lancets Misc. (ONE TOUCH SURESOFT) MISC, Check blood sugar 1 time a day-DX-E11.91   Magnesium 250 MG TABS, Take 250 mg by mouth daily.    Multiple Vitamin (MULTIVITAMIN)  capsule, Take 1 capsule by mouth daily.   triamcinolone ointment (KENALOG) 0.1 %, Apply topically 2 (two) times daily.   vitamin C (ASCORBIC ACID) 500 MG tablet, Take 500 mg by mouth daily.  Allergies: Allergies  Allergen Reactions   Penicillins Other (See Comments)    Current Problems (verified) has Paroxysmal atrial fibrillation (HCC); MGUS (monoclonal gammopathy of unknown significance); Essential hypertension; Hyperlipidemia associated with type 2 diabetes mellitus ([place]); Type 2 diabetes mellitus (HCC); BPH (benign prostatic hyperplasia); Medication management; Vitamin D deficiency; Hypothyroidism; History of CVA with residual deficit; Ataxia, post-stroke; Chronic anticoagulation; CKD stage 2 due to type 2 diabetes mellitus ([employer]); Late effect of cerebrovascular accident (CVA); Acquired thrombophilia ([place]); Abdominal aortic atherosclerosis ([place]) by Abd CTscvn  2016; Neurologic gait disorder; Dizziness; History of loop recorder; and Diabetic 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 ([place]) on their problem list.  Screening Tests Immunization History  Administered Date(s) Administered   DT (Pediatric) 02/09/2015   Influenza Split 08/07/2013   Influenza, High Dose Seasonal PF 08/19/2014, 08/28/2015, 09/13/2016, 08/15/2017, 08/15/2018, 09/06/2019, 10/19/2020   PFIZER([place])SARS-COV-2 Vaccination 12/11/2019, 01/01/2020, 02/24/2021   Pneumococcal Conjugate-13 06/10/2016   Pneumococcal Polysaccharide-23 08/13/2008   Td 11/22/2003    Preventative care: Last colonoscopy: 04/2019, DONE EGD 04/2019  Echo/TEE 2018 EF 55%, MR MRI brain 2018 + stroke CXR 12/2019 CT head 2020 AB [place] 12/2019 fatty liver  Prior vaccinations: TD or Tdap: 2016  Influenza: 2021 Pneumococcal: 2009 Prevnar13: 2017 Shingrix: reports had first dose, pending completion  Covid 19: 2/2 + 2 boosters - missing 3rd booster date, record requesting  Names of Other Physician/Practitioners you currently  use: 1. [facility] here for primary care 2. Diabetic Eye Exam: Dr. Melissa Noon, no retinopathy 10/08/2020 3. Dentist:  [employer], last 2022, goes q75m has scheduled in July, has upper partial   Patient Care Team: MUnk Pinto MD as PCP - General (Internal Medicine) KDeboraha Sprang MD as PCP - Cardiology (Cardiology) KDeboraha Sprang MD as PCP - Electrophysiology (Cardiology) KInda Castle MD (Inactive) as Consulting Physician (Gastroenterology) SWyatt Portela MD as Consulting Physician (Oncology) GSharyne Peach MD as Consulting Physician (Ophthalmology)  Surgical: He  has a past surgical history that includes Inguinal hernia repair (Bilateral); TEE without cardioversion (N/A, 07/28/2017); LOOP RECORDER INSERTION (N/A, 07/28/2017); Colonoscopy; and Circumcision (N/A, 06/11/2019). Family His family history includes Colon cancer in his father; Diabetes in his father; Sickle cell anemia in his  mother; Stomach cancer in his father. Social history  He reports that he has never smoked. He has never used smokeless tobacco. He reports that he does not drink alcohol and does not use drugs.  MEDICARE WELLNESS OBJECTIVES: Physical activity: Current Exercise Habits: Home exercise routine, Type of exercise: walking, Time (Minutes): 60, Frequency (Times/Week): 3, Weekly Exercise (Minutes/Week): 180, Intensity: Mild, Exercise limited by: None identified Cardiac risk factors: Cardiac Risk Factors include: advanced age (>40mn, >>65women);dyslipidemia;hypertension;diabetes mellitus;male gender Depression/mood screen:   [facility] 05/17/2021  Decreased Interest 0  Down, Depressed, Hopeless 0  PHQ - 2 Score 0  Altered sleeping -  Tired, decreased energy -  Change in appetite -  Feeling bad or failure about yourself  -  Trouble concentrating -  Moving slowly or fidgety/restless -  Suicidal thoughts -  PHQ-9 Score -  Difficult doing  work/chores -  Some recent data might be hidden    ADLs:  In your present state of health, do you have any difficulty performing the following activities: 05/17/2021  Hearing? N  Vision? N  Difficulty concentrating or making decisions? N  Walking or climbing stairs? Y  Comment uses cane, can manage slowly  Dressing or bathing? N  Doing errands, shopping? N  Some recent data might be hidden      Cognitive Testing  Alert? Yes  Normal Appearance?Yes  Oriented to person? Yes  Place? Yes   Time? Yes  Recall of three objects?  1/3  Can perform simple calculations? Yes  Displays appropriate judgment?Yes  Can read the correct time from a watch face?Yes  EOL planning: Does Patient Have a Medical Advance Directive?: Yes Type of Advance Directive: Healthcare Power of Attorney, Living will Does patient want to make changes to medical advance directive?: No - Patient declined Copy of HBig Poolin Chart?: No - copy requested  Objective:   Today's Vitals   05/17/21 1054  BP: 126/68  Pulse: 85  Temp: (!) 96.4 F (35.8 C)  SpO2: 98%  Weight: 173 lb (78.5 kg)  Height: _0  (1.803 m)    Body mass index is 24.13 kg/m.  General Appearance: Well nourished, well dressed AA elder male, in no apparent distress. Eyes: PERRLA, EOMs, conjunctiva no swelling or erythema Sinuses: No Frontal/maxillary tenderness ENT/Mouth: Ext aud canals clear, TMs without erythema, bulging. No erythema, swelling, or exudate on post pharynx.  Tonsils not swollen or erythematous. Upper partials. Hearing normal.  Neck: Supple, thyroid normal.  Respiratory: Respiratory effort normal, BS equal bilaterally without rales, rhonchi, wheezing or stridor.  Cardio: RRR with no MRGs. Brisk peripheral pulses without edema.  Abdomen: Soft, + BS.  Non tender, no guarding, rebound, hernias, masses. Lymphatics: Non tender without lymphadenopathy.  Musculoskeletal: Full ROM, 5/5 strength, slow gait with  cane Skin: Warm, dry without rashes, lesions, ecchymosis. Left 1st and second toenails thickened and deviated. Callouses Neuro: Cranial nerves intact. No cerebellar symptoms. Sensation intact to monofilament in bil feet.  Psych: Awake and oriented X 3, intermittently tearful affect, Insight and Judgment appropriate.   Medicare Attestation I have personally reviewed: The patient's medical and social history Their use of alcohol, tobacco or illicit drugs Their current medications and supplements The patient's functional ability including ADLs,fall risks, home safety risks, cognitive, and hearing and visual impairment Diet and physical activities Evidence for depression or mood disorders  The patient's weight, height, BMI, and visual acuity have been recorded in the chart.  I have made referrals, counseling,  and provided education to the patient based on review of the above and I have provided the patient with a written personalized care plan for preventive services.    Future Appointments  Date Time Provider [place]  09/10/2021 11:00 AM [name], MD [facility] None  12/22/2021 10:00 AM [facility] None  12/29/2021 10:30 AM Wyatt Portela, MD [facility] None  05/17/2022 11:00 AM Liane Comber, NP [facility] None    Izora Ribas, NP 12:01 PM [facility]

## 2021-05-17 ENCOUNTER — Ambulatory Visit (INDEPENDENT_AMBULATORY_CARE_PROVIDER_SITE_OTHER): Payer: Medicare Other | Admitting: Adult Health

## 2021-05-17 ENCOUNTER — Other Ambulatory Visit: Payer: Self-pay

## 2021-05-17 ENCOUNTER — Encounter: Payer: Self-pay | Admitting: Adult Health

## 2021-05-17 VITALS — BP 126/68 | HR 85 | Temp 96.4°F | Ht 71.0 in | Wt 173.0 lb

## 2021-05-17 DIAGNOSIS — R6889 Other general symptoms and signs: Secondary | ICD-10-CM

## 2021-05-17 DIAGNOSIS — N182 Chronic kidney disease, stage 2 (mild): Secondary | ICD-10-CM

## 2021-05-17 DIAGNOSIS — Z0001 Encounter for general adult medical examination with abnormal findings: Secondary | ICD-10-CM

## 2021-05-17 DIAGNOSIS — Z0000 Encounter for general adult medical examination without abnormal findings: Secondary | ICD-10-CM

## 2021-05-17 DIAGNOSIS — I480 Paroxysmal atrial fibrillation: Secondary | ICD-10-CM | POA: Diagnosis not present

## 2021-05-17 DIAGNOSIS — D6869 Other thrombophilia: Secondary | ICD-10-CM | POA: Diagnosis not present

## 2021-05-17 DIAGNOSIS — I700 Atherosclerosis of aorta: Secondary | ICD-10-CM | POA: Diagnosis not present

## 2021-05-17 DIAGNOSIS — I10 Essential (primary) hypertension: Secondary | ICD-10-CM | POA: Diagnosis not present

## 2021-05-17 DIAGNOSIS — E1169 Type 2 diabetes mellitus with other specified complication: Secondary | ICD-10-CM

## 2021-05-17 DIAGNOSIS — E559 Vitamin D deficiency, unspecified: Secondary | ICD-10-CM

## 2021-05-17 DIAGNOSIS — E1141 Type 2 diabetes mellitus with diabetic mononeuropathy: Secondary | ICD-10-CM | POA: Diagnosis not present

## 2021-05-17 DIAGNOSIS — D472 Monoclonal gammopathy: Secondary | ICD-10-CM | POA: Diagnosis not present

## 2021-05-17 DIAGNOSIS — I6930 Unspecified sequelae of cerebral infarction: Secondary | ICD-10-CM | POA: Diagnosis not present

## 2021-05-17 DIAGNOSIS — Z79899 Other long term (current) drug therapy: Secondary | ICD-10-CM

## 2021-05-17 DIAGNOSIS — E039 Hypothyroidism, unspecified: Secondary | ICD-10-CM

## 2021-05-17 DIAGNOSIS — E1122 Type 2 diabetes mellitus with diabetic chronic kidney disease: Secondary | ICD-10-CM | POA: Diagnosis not present

## 2021-05-17 DIAGNOSIS — E785 Hyperlipidemia, unspecified: Secondary | ICD-10-CM | POA: Diagnosis not present

## 2021-05-17 DIAGNOSIS — Z6824 Body mass index (BMI) 24.0-24.9, adult: Secondary | ICD-10-CM

## 2021-05-17 DIAGNOSIS — M79675 Pain in left toe(s): Secondary | ICD-10-CM

## 2021-05-17 DIAGNOSIS — R269 Unspecified abnormalities of gait and mobility: Secondary | ICD-10-CM

## 2021-05-17 DIAGNOSIS — N401 Enlarged prostate with lower urinary tract symptoms: Secondary | ICD-10-CM

## 2021-05-17 DIAGNOSIS — I69393 Ataxia following cerebral infarction: Secondary | ICD-10-CM

## 2021-05-17 MED ORDER — LEVOTHYROXINE SODIUM 50 MCG PO TABS: ORAL_TABLET | ORAL | 3 refills | Status: DC

## 2021-05-17 NOTE — Patient Instructions (Signed)
    Please follow up on shingrix second shot - due 2-6 months from the fist Please have pharmacy send [place] the records  Please send covid 19 #3 - we have #1, 2 and 4

## 2021-05-18 LAB — COMPLETE METABOLIC PANEL WITH GFR
AG Ratio: 1.7 (calc) (ref 1.0–2.5)
ALT: 29 U/L (ref 9–46)
AST: 24 U/L (ref 10–35)
Albumin: 4 g/dL (ref 3.6–5.1)
Alkaline phosphatase (APISO): 55 U/L (ref 35–144)
BUN: 20 mg/dL (ref 7–25)
CO2: 29 mmol/L (ref 20–32)
Calcium: 9.4 mg/dL (ref 8.6–10.3)
Chloride: 105 mmol/L (ref 98–110)
Creat: 1.05 mg/dL (ref 0.70–1.11)
GFR, Est African American: 77 mL/min/{1.73_m2} (ref >=60)
GFR, Est Non African American: 67 mL/min/{1.73_m2} (ref >=60)
Globulin: 2.3 g/dL (calc) (ref 1.9–3.7)
Glucose, Bld: 129 mg/dL — ABNORMAL HIGH (ref 65–99)
Potassium: 4.3 mmol/L (ref 3.5–5.3)
Sodium: 143 mmol/L (ref 135–146)
Total Bilirubin: 0.6 mg/dL (ref 0.2–1.2)
Total Protein: 6.3 g/dL (ref 6.1–8.1)

## 2021-05-18 LAB — CBC WITH DIFFERENTIAL/PLATELET
Absolute Monocytes: 416 cells/uL (ref 200–950)
Basophils Absolute: 38 cells/uL (ref 0–200)
Basophils Relative: 0.7 %
Eosinophils Absolute: 140 cells/uL (ref 15–500)
Eosinophils Relative: 2.6 %
HCT: 38.6 % (ref 38.5–50.0)
Hemoglobin: 13.3 g/dL (ref 13.2–17.1)
Lymphs Abs: 1382 cells/uL (ref 850–3900)
MCH: 29.6 pg (ref 27.0–33.0)
MCHC: 34.5 g/dL (ref 32.0–36.0)
MCV: 86 fL (ref 80.0–100.0)
MPV: 11.5 fL (ref 7.5–12.5)
Monocytes Relative: 7.7 %
Neutro Abs: 3424 cells/uL (ref 1500–7800)
Neutrophils Relative %: 63.4 %
Platelets: 187 10*3/uL (ref 140–400)
RBC: 4.49 10*6/uL (ref 4.20–5.80)
RDW: 12.9 % (ref 11.0–15.0)
Total Lymphocyte: 25.6 %
WBC: 5.4 10*3/uL (ref 3.8–10.8)

## 2021-05-18 LAB — TSH: TSH: 0.58 mIU/L (ref 0.40–4.50)

## 2021-05-18 LAB — HEMOGLOBIN A1C
Hgb A1c MFr Bld: 5.9 % of total Hgb — ABNORMAL HIGH (ref <5.7)
Mean Plasma Glucose: 123 mg/dL
eAG (mmol/L): 6.8 mmol/L

## 2021-05-18 LAB — LIPID PANEL
Cholesterol: 123 mg/dL (ref <200)
HDL: 57 mg/dL (ref >=40)
LDL Cholesterol (Calc): 52 mg/dL (calc)
Non-HDL Cholesterol (Calc): 66 mg/dL (calc) (ref <130)
Total CHOL/HDL Ratio: 2.2 (calc) (ref <5.0)
Triglycerides: 55 mg/dL (ref <150)

## 2021-05-18 LAB — MAGNESIUM: Magnesium: 1.9 mg/dL (ref 1.5–2.5)

## 2021-06-10 ENCOUNTER — Other Ambulatory Visit: Payer: Self-pay

## 2021-06-10 ENCOUNTER — Ambulatory Visit (INDEPENDENT_AMBULATORY_CARE_PROVIDER_SITE_OTHER): Payer: Medicare Other | Admitting: Podiatry

## 2021-06-10 DIAGNOSIS — B351 Tinea unguium: Secondary | ICD-10-CM

## 2021-06-10 DIAGNOSIS — E1142 Type 2 diabetes mellitus with diabetic polyneuropathy: Secondary | ICD-10-CM

## 2021-06-10 DIAGNOSIS — M79676 Pain in unspecified toe(s): Secondary | ICD-10-CM | POA: Diagnosis not present

## 2021-06-10 DIAGNOSIS — D689 Coagulation defect, unspecified: Secondary | ICD-10-CM | POA: Diagnosis not present

## 2021-06-12 NOTE — Progress Notes (Signed)
Subjective:  Patient ID: Earl [name], male    DOB: 1941/02/08,  MRN: [id number] HPI Chief Complaint  Patient presents with   Nail Problem    Diabetic foot exam and nail trim     80 y.o. male presents with the above complaint.   ROS: Denies fever chills nausea vomiting muscle aches pains calf pain back pain chest pain shortness of breath.  Past Medical History:  Diagnosis Date   Adenomatous colon polyp    Allergy    Anal fissure    Arthritis    BPH (benign prostatic hyperplasia)    Diabetes mellitus ([place])    GERD (gastroesophageal reflux disease)    Hyperlipidemia    Hypertension    Hypothyroidism    Other testicular hypofunction    Stroke [facility])    Past Surgical History:  Procedure Laterality Date   CIRCUMCISION N/A 06/11/2019   Procedure: CIRCUMCISION ADULT;  Surgeon: [name], MD;  Location: [facility];  Service: Urology;  Laterality: N/A;   COLONOSCOPY     INGUINAL HERNIA REPAIR Bilateral    1968 and 2004   LOOP RECORDER INSERTION N/A 07/28/2017   Procedure: LOOP RECORDER INSERTION;  Surgeon: Deboraha Sprang, MD;  Location: [facility];  Service: Cardiovascular;  Laterality: N/A;   TEE WITHOUT CARDIOVERSION N/A 07/28/2017   Procedure: TRANSESOPHAGEAL ECHOCARDIOGRAM (TEE);  Surgeon: Sueanne Margarita, MD;  Location: [facility];  Service: Cardiovascular;  Laterality: N/A;    Current Outpatient Medications:    atorvastatin (LIPITOR) 40 MG tablet, TAKE 1 TABLET BY MOUTH  DAILY FOR CHOLESTEROL, Disp: 90 tablet, Rfl: 3   Blood Glucose Monitoring Suppl (ONE TOUCH ULTRA 2) w/Device KIT, Check blood sugar 1 time a day-DX-E11.9, Disp: 1 kit, Rfl: 0   Cholecalciferol (VITAMIN D PO), Take 1 tablet by mouth daily. , Disp: , Rfl:    Cyanocobalamin (B-12) 50 MCG TABS, Take 50 mcg by mouth daily., Disp: , Rfl:    ELIQUIS 5 MG TABS tablet, TAKE 1 TABLET BY MOUTH  TWICE DAILY TO PREVENT  BLOOD CLOTS, Disp: 180 tablet, Rfl: 3   finasteride (PROSCAR) 5 MG tablet, TAKE 1  TABLET BY MOUTH  DAILY FOR PROSTATE, Disp: 90 tablet, Rfl: 3   furosemide (LASIX) 40 MG tablet, Take 1 tablet (40 mg total) by mouth daily., Disp: 90 tablet, Rfl: 3   gabapentin (NEURONTIN) 300 MG capsule, Take 1 to 2 capsules 1 hour before Bedtime for Diabetic Neuropathy Pains, Disp: 180 capsule, Rfl: 1   glipiZIDE (GLUCOTROL) 5 MG tablet, Take  1 tablet  2 x /day  with Meals  for Diabetes, Disp: 180 tablet, Rfl: 3   glucose blood (ONETOUCH ULTRA) test strip, CHECK BLOOD SUGAR ONCE A DAY., Disp: 100 strip, Rfl: 3   Lancets (ONETOUCH DELICA PLUS [id number]) MISC, CHECK BLOOD SUGAR ONCE A DAY., Disp: 100 each, Rfl: 3   Lancets Misc. (ONE TOUCH SURESOFT) MISC, Check blood sugar 1 time a day-DX-E11.91, Disp: 1 each, Rfl: 0   levothyroxine (SYNTHROID) 50 MCG tablet, TAKE WHOLE TAB SUN/TUES/THURS, TAKE 1/2 TAB ALL OTHER DAYS. TAKE IN THE MORNING ON AN EMPTY STOMACH WITH ONLY WATER 30 MINUTES BEFORE BREAKFAST AND NO ANTACID MEDS, CALCIUM OR MAGNESIUM FOR 4 HOURS AND AVOID BIOTIN, Disp: 90 tablet, Rfl: 3   Magnesium 250 MG TABS, Take 250 mg by mouth daily. , Disp: , Rfl:    metFORMIN (GLUCOPHAGE-XR) 500 MG 24 hr tablet, TAKE 2 TABLETS BY MOUTH 2  TIMES DAILY WITH MEALS FOR  DIABETES,  Disp: 360 tablet, Rfl: 3   Multiple Vitamin (MULTIVITAMIN) capsule, Take 1 capsule by mouth daily., Disp: , Rfl:    sodium chloride (OCEAN) 0.65 % nasal spray, Place 2 sprays into the nose as needed for congestion., Disp: 30 mL, Rfl: 3   triamcinolone ointment (KENALOG) 0.1 %, Apply topically 2 (two) times daily., Disp: 15 g, Rfl: 0   vitamin C (ASCORBIC ACID) 500 MG tablet, Take 500 mg by mouth daily., Disp: , Rfl:   Allergies  Allergen Reactions   Penicillins Other (See Comments)   Review of Systems Objective:  There were no vitals filed for this visit.  General: Well developed, nourished, in no acute distress, alert and oriented x3   Dermatological: Skin is warm, dry and supple bilateral. Nails x 10 are well  maintained; remaining integument appears unremarkable at this time. There are no open sores, no preulcerative lesions, no rash or signs of infection present.  Vascular: Dorsalis Pedis artery and Posterior Tibial artery pedal pulses are 2/4 bilateral with immedate capillary fill time. Pedal hair growth present. No varicosities and no lower extremity edema present bilateral.   Neruologic: Grossly intact via light touch bilateral. Vibratory intact via tuning fork bilateral. Protective threshold with Semmes Wienstein monofilament intact to all pedal sites bilateral. Patellar and Achilles deep tendon reflexes 2+ bilateral. No Babinski or clonus noted bilateral.   Musculoskeletal: No gross boney pedal deformities bilateral. No pain, crepitus, or limitation noted with foot and ankle range of motion bilateral. Muscular strength 5/5 in all groups tested left and 4 out of 5 on the right.  Mild edema about the right side most likely associated with stroke negative calf pain.  Gait: Unassisted, Nonantalgic.    Radiographs:  None taken  Assessment & Plan:   Assessment: Weaker right side secondary to stroke.  Pain in limb secondary to onychomycosis.  Plan: Debridement of toenails 1 through 5 bilateral.     Ahaana Rochette T. [place], [place]

## 2021-06-18 ENCOUNTER — Other Ambulatory Visit: Payer: Self-pay | Admitting: Internal Medicine

## 2021-06-18 DIAGNOSIS — E1140 Type 2 diabetes mellitus with diabetic neuropathy, unspecified: Secondary | ICD-10-CM

## 2021-07-07 ENCOUNTER — Other Ambulatory Visit: Payer: Self-pay

## 2021-07-07 ENCOUNTER — Encounter: Payer: Self-pay | Admitting: Adult Health

## 2021-07-07 ENCOUNTER — Ambulatory Visit (INDEPENDENT_AMBULATORY_CARE_PROVIDER_SITE_OTHER): Payer: Medicare Other | Admitting: Adult Health

## 2021-07-07 VITALS — BP 122/76 | HR 90 | Temp 97.3°F | Resp 17 | Ht 71.0 in | Wt 182.6 lb

## 2021-07-07 DIAGNOSIS — R269 Unspecified abnormalities of gait and mobility: Secondary | ICD-10-CM | POA: Diagnosis not present

## 2021-07-07 DIAGNOSIS — B351 Tinea unguium: Secondary | ICD-10-CM | POA: Insufficient documentation

## 2021-07-07 DIAGNOSIS — R609 Edema, unspecified: Secondary | ICD-10-CM | POA: Diagnosis not present

## 2021-07-07 DIAGNOSIS — M79675 Pain in left toe(s): Secondary | ICD-10-CM | POA: Diagnosis not present

## 2021-07-07 DIAGNOSIS — Z9181 History of falling: Secondary | ICD-10-CM | POA: Diagnosis not present

## 2021-07-07 DIAGNOSIS — R7989 Other specified abnormal findings of blood chemistry: Secondary | ICD-10-CM | POA: Diagnosis not present

## 2021-07-07 DIAGNOSIS — D472 Monoclonal gammopathy: Secondary | ICD-10-CM | POA: Diagnosis not present

## 2021-07-07 DIAGNOSIS — D649 Anemia, unspecified: Secondary | ICD-10-CM | POA: Diagnosis not present

## 2021-07-07 DIAGNOSIS — R4589 Other symptoms and signs involving emotional state: Secondary | ICD-10-CM | POA: Insufficient documentation

## 2021-07-07 DIAGNOSIS — N182 Chronic kidney disease, stage 2 (mild): Secondary | ICD-10-CM | POA: Diagnosis not present

## 2021-07-07 DIAGNOSIS — E1122 Type 2 diabetes mellitus with diabetic chronic kidney disease: Secondary | ICD-10-CM | POA: Diagnosis not present

## 2021-07-07 MED ORDER — DULOXETINE HCL 20 MG PO CPEP: ORAL_CAPSULE | ORAL | 0 refills | Status: DC

## 2021-07-07 MED ORDER — GLIPIZIDE 5 MG PO TABS: ORAL_TABLET | ORAL | 3 refills | Status: DC

## 2021-07-07 NOTE — Progress Notes (Signed)
Assessment and Plan:  Earl [name] was seen today for acute visit.  Diagnoses and all orders for this visit:  Peripheral edema No other sx suggestive of CHF, low suspicion Suspect secondary to stopping regular exercise; discussed circulation - elevate legs TID, increase activity, walking or ankle exercises q1-2 h, increase water, decrease sodium intake.  Wear compression socks daily.  Check labs. If normal can increase lasix to BID 1-3 days with daughter supervision of BP then resume once daily dosing.  Return to the office if no change with symptoms.  -     CBC with Differential/Platelet -     COMPLETE METABOLIC PANEL WITH GFR  Great toe pain, left R/o gout though exam most suggestive of  Pain due to onychomycosis of toenail of left foot He is following with Dr. Milinda [name] for this with debridement; daughter states will schedule sooner follow up if needed Ok to continue PRN aspercreme, tylenol 500 mg TID PRN Also starting cymbalta for mood -  Follow up if any worse/changes -     CBC with Differential/Platelet -     Uric acid  Neurologic gait disorder At risk for injury related to fall -     Ambulatory referral to [place]  Depressed mood Start new medication as prescribed for mood and pain Stress management techniques discussed, increase water, good sleep hygiene discussed, increase exercise, and increase veggies.  Follow up 6-8 weeks, call the office if any new AE's from medications and we will switch them -     DULoxetine (CYMBALTA) 20 MG capsule; Take 1 cap daily for mood and pain.   Further disposition pending results of labs. Discussed med's effects and SE's.   Over 30 minutes of exam, counseling, chart review, and critical decision making was performed.   Future Appointments  Date Time Provider [place]  09/10/2021 11:00 AM Earl Pinto, MD [facility] None  09/14/2021 10:00 AM Earl [name], DPM [facility]  12/22/2021 10:00 AM [facility]  [facility] None  12/29/2021 10:30 AM Earl Portela, MD [facility] None  05/17/2022 11:00 AM Earl Comber, NP [facility] None    ------------------------------------------------------------------------------------------------------------------   HPI BP 122/76   Pulse 90   Temp (!) 97.3 F (36.3 C)   Resp 17   Ht $R'5\' 11"'uW$  (1.803 m)   Wt 182 lb 9.6 oz (82.8 kg)   SpO2 98%   BMI 25.47 kg/m  80 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   His daughter Earl [name] reached out via mychart to initiate, didn't feel he was communicating. She checks in on him regularly, but he lives by himself. Have had several deaths in family, he reports depressed mood.   Reports increased swelling of bilateral feet/lower legs x 2 weeks; R is chronically mildly edematous following CVA, left is new. He wears compression hose and takes lasix 40 mg daily. He watches sodium intake. He admits was walking regularly several days a week, stopped around the same time edema sx began. He does not have CHF hx, denies dyspnea, cough, CP, fatigue, PND. Daughter noted he was unable to put on shoe and was concerned.   He has T2DM with neuropathic pains, reports this is well controlled by gabapentin, taking 300 mg 1-2 caps bedtime. He reports increased pain in left toe, worse with walking and has been avoiding walking much due to this. Recently in 06/10/2021 saw  Dr. Milinda [name], had toenail debridement for onychomycosis. Patient has been applying aspercreme BID to toe with benefit, but remains quite tender and has been avoiding walking due to this.   He has T2DM, typically well controlled A1C <7%. Last A1C in the office was:  Lab Results  Component Value Date   HGBA1C 5.9 (H) 05/17/2021    BMI is Body mass index is 25.47 kg/m., he has been working on diet, no exercise in 2 weeks. Daughter is requesting  referral for home PT, he does not drive, but ongoing unsteady gait following CVA with residual R weakness, has had some falls.  Wt Readings from Last 3 Encounters:  07/07/21 182 lb 9.6 oz (82.8 kg)  05/17/21 173 lb (78.5 kg)  05/07/21 173 lb 9.6 oz (78.7 kg)     Past Medical History:  Diagnosis Date   Adenomatous colon polyp    Allergy    Anal fissure    Arthritis    BPH (benign prostatic hyperplasia)    Diabetes mellitus (HCC)    GERD (gastroesophageal reflux disease)    Hyperlipidemia    Hypertension    Hypothyroidism    Other testicular hypofunction    Stroke (HCC)      Allergies  Allergen Reactions   Penicillins Other (See Comments)    Current Outpatient Medications on File Prior to Visit  Medication Sig   atorvastatin (LIPITOR) 40 MG tablet TAKE 1 TABLET BY MOUTH  DAILY FOR CHOLESTEROL   Blood Glucose Monitoring Suppl (ONE TOUCH ULTRA 2) w/Device KIT Check blood sugar 1 time a day-DX-E11.9   Cholecalciferol (VITAMIN D PO) Take 1 tablet by mouth daily.    Cyanocobalamin (B-12) 50 MCG TABS Take 50 mcg by mouth daily.   ELIQUIS 5 MG TABS tablet TAKE 1 TABLET BY MOUTH  TWICE DAILY TO PREVENT  BLOOD CLOTS   finasteride (PROSCAR) 5 MG tablet TAKE 1 TABLET BY MOUTH  DAILY FOR PROSTATE   furosemide (LASIX) 40 MG tablet Take 1 tablet (40 mg total) by mouth daily.   gabapentin (NEURONTIN) 300 MG capsule Take  1 to 2 capsules  1 hour before Bedtime  for Diabetic Neuropathy pains /Patient knows to take by mouth   glipiZIDE (GLUCOTROL) 5 MG tablet Take  1 tablet  2 x /day  with Meals  for Diabetes   glucose blood (ONETOUCH ULTRA) test strip CHECK BLOOD SUGAR ONCE A DAY.   Lancets (ONETOUCH DELICA PLUS [id number]) MISC CHECK BLOOD SUGAR ONCE A DAY.   Lancets Misc. (ONE TOUCH SURESOFT) MISC Check blood sugar 1 time a day-DX-E11.91   levothyroxine (SYNTHROID) 50 MCG tablet TAKE WHOLE TAB SUN/TUES/THURS, TAKE 1/2 TAB ALL OTHER DAYS. TAKE IN THE MORNING ON AN EMPTY STOMACH WITH ONLY  WATER 30 MINUTES BEFORE BREAKFAST AND NO ANTACID MEDS, CALCIUM OR MAGNESIUM FOR 4 HOURS AND AVOID BIOTIN   Magnesium 250 MG TABS Take 250 mg by mouth daily.    metFORMIN (GLUCOPHAGE-XR) 500 MG 24 hr tablet TAKE 2 TABLETS BY MOUTH 2  TIMES DAILY WITH MEALS FOR  DIABETES   Multiple Vitamin (MULTIVITAMIN) capsule Take 1 capsule by mouth daily.   sodium chloride (OCEAN) 0.65 % nasal spray Place 2 sprays into the nose as needed for congestion.   triamcinolone ointment (KENALOG) 0.1 % Apply topically 2 (two) times daily.   vitamin C (ASCORBIC ACID) 500 MG tablet Take 500 mg by mouth daily.   No current facility-administered medications on file prior to visit.   Allergies:  Allergies  Allergen  Reactions   Penicillins Other (See Comments)   Medical History:  has Paroxysmal atrial fibrillation ([place]); MGUS (monoclonal gammopathy of unknown significance); Essential hypertension; Hyperlipidemia associated with type 2 diabetes mellitus ([place]); Type 2 diabetes mellitus (HCC); BPH (benign prostatic hyperplasia); Medication management; Vitamin D deficiency; Hypothyroidism; History of CVA with residual deficit; Ataxia, post-stroke; Chronic anticoagulation; CKD stage 2 due to type 2 diabetes mellitus ([place]); Late effect of cerebrovascular accident (CVA); Acquired thrombophilia ([place]); Abdominal aortic atherosclerosis ([place]) by Abd CTscvn  2016; Neurologic gait disorder; Dizziness; History of loop recorder; Diabetic 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 ([place]); Depressed mood; and Pain due to onychomycosis of toenail of left foot on their problem list. Surgical History:  He  has a past surgical history that includes Inguinal hernia repair (Bilateral); TEE without cardioversion (N/A, 07/28/2017); LOOP RECORDER INSERTION (N/A, 07/28/2017); Colonoscopy; and Circumcision (N/A, 06/11/2019). Family History:  Hisfamily history includes Colon cancer in his father; Diabetes in his father; Sickle cell anemia in his  mother; Stomach cancer in his father. Social History:   reports that he has never smoked. He has never used smokeless tobacco. He reports that he does not drink alcohol and does not use drugs.   ROS: all negative except above.   Physical Exam:  BP 122/76   Pulse 90   Temp (!) 97.3 F (36.3 C)   Resp 17   Ht $R'5\' 11"'uU$  (1.803 m)   Wt 182 lb 9.6 oz (82.8 kg)   SpO2 98%   BMI 25.47 kg/m   General Appearance: Well nourished, well dressed AA elder in no apparent distress. Eyes: PERRLA, EOMs, conjunctiva no swelling or erythema Sinuses: No Frontal/maxillary tenderness ENT/Mouth: Ext aud canals clear, TMs without erythema, bulging. No erythema, swelling, or exudate on post pharynx.  Tonsils not swollen or erythematous. Mildly HOH.  Neck: Supple, thyroid normal.  Respiratory: Respiratory effort normal, BS equal bilaterally without rales, rhonchi, wheezing or stridor.  Cardio: Irregularly irregular, ? very mild murmur. Intact peipheral pulses, bil 1+ pitting edema to feet, 2+ to ankles.  Abdomen: Soft, + BS.  Non tender, no guarding, rebound, hernias, masses. Lymphatics: Non tender without lymphadenopathy.  Musculoskeletal: Strength fairly intact except 4/5 RLE; slow antalgic gait with cane. L DIP joint faintly erythematous though non-tender.  Skin: Warm, dry; left toenail thickened; distal skin is hardened/very calloused. Tender to that area. No erythema or discharge.  Neuro: Normal muscle tone, Sensation intact to light touch bil feet.  Psych: Awake and oriented X 3, normal affect, Insight and Judgment appropriate.     Izora Ribas, NP 2:03 PM [facility]

## 2021-07-07 NOTE — Patient Instructions (Signed)
Peripheral Edema  Peripheral edema is swelling that is caused by a buildup of fluid. Peripheral edema most often affects the lower legs, ankles, and feet. It can also develop in the arms, hands, and face. The area of the body that has peripheral edema will look swollen. It may also feel heavy or warm. Your clothes may start to feel tight. Pressing on the area may make a temporary dent in your skin. Youmay not be able to move your swollen arm or leg as much as usual. There are many causes of peripheral edema. It can happen because of a complication of other conditions such as congestive heart failure, kidney disease, or a problem with your blood circulation. It also can be a side effect of certain medicines or because of an infection. It often happens to womenduring pregnancy. Sometimes, the cause is not known. Follow these instructions at home: Managing pain, stiffness, and swelling  Raise (elevate) your legs while you are sitting or lying down. Move around often to prevent stiffness and to lessen swelling. Do not sit or stand for long periods of time. Wear support stockings as told by your health care provider.  Medicines Take over-the-counter and prescription medicines only as told by your health care provider. Your health care provider may prescribe medicine to help your body get rid of excess water (diuretic). General instructions Pay attention to any changes in your symptoms. Follow instructions from your health care provider about limiting salt (sodium) in your diet. Sometimes, eating less salt may reduce swelling. Moisturize skin daily to help prevent skin from cracking and draining. Keep all follow-up visits as told by your health care provider. This is important. Contact a health care provider if you have: A fever. Edema that starts suddenly or is getting worse, especially if you are pregnant or have a medical condition. Swelling in only one leg. Increased swelling, redness, or pain  in one or both of your legs. Drainage or sores at the area where you have edema. Get help right away if you: Develop shortness of breath, especially when you are lying down. Have pain in your chest or abdomen. Feel weak. Feel faint. Summary Peripheral edema is swelling that is caused by a buildup of fluid. Peripheral edema most often affects the lower legs, ankles, and feet. Move around often to prevent stiffness and to lessen swelling. Do not sit or stand for long periods of time. Pay attention to any changes in your symptoms. Contact a health care provider if you have edema that starts suddenly or is getting worse, especially if you are pregnant or have a medical condition. Get help right away if you develop shortness of breath, especially when lying down. This information is not intended to replace advice given to you by your health care provider. Make sure you discuss any questions you have with your healthcare provider. Document Revised: 08/01/2018 Document Reviewed: 08/01/2018 Elsevier Patient Education  2022 [place].    Duloxetine Delayed-Release Capsules What is this medication? DULOXETINE (doo LOX e teen) treats depression, anxiety, fibromyalgia, and certain types of chronic pain such as nerve, bone, or joint pain. It increases the amount of serotonin and norepinephrine in the brain, hormones that helpregulate mood and pain. It belongs to a group of medications called SNRIs. This medicine may be used for other purposes; ask your health care provider orpharmacist if you have questions. COMMON BRAND NAME(S): Cymbalta, Drizalma, Irenka What should I tell my care team before I take this medication? They need to  know if you have any of these conditions: Bipolar disorder Glaucoma High blood pressure Kidney disease Liver disease Seizures Suicidal thoughts, plans or attempt; a previous suicide attempt by you or a family member Take medications that treat or prevent blood  clots Taken medications called MAOIs like Carbex, Eldepryl, Marplan, Nardil, and Parnate within 14 days Trouble passing urine An unusual reaction to duloxetine, other medications, foods, dyes, or preservatives Pregnant or trying to get pregnant Breast-feeding How should I use this medication? Take this medication by mouth with a glass of water. Follow the directions on the prescription label. Do not crush, cut or chew some capsules of this medication. Some capsules may be opened and sprinkled on applesauce. Check with your care team or pharmacist if you are not sure. You can take this medication with or without food. Take your medication at regular intervals. Do not take your medication more often than directed. Do not stop taking this medication suddenly except upon the advice of your care team. Stopping this medication tooquickly may cause serious side effects or your condition may worsen. A special MedGuide will be given to you by the pharmacist with eachprescription and refill. Be sure to read this information carefully each time. Talk to your care team regarding the use of this medication in children. While this medication may be prescribed for children as young as 45 years of age forselected conditions, precautions do apply. Overdosage: If you think you have taken too much of this medicine contact apoison control center or emergency room at once. NOTE: This medicine is only for you. Do not share this medicine with others. What if I miss a dose? If you miss a dose, take it as soon as you can. If it is almost time for yournext dose, take only that dose. Do not take double or extra doses. What may interact with this medication? Do not take this medication with any of the following: Desvenlafaxine Levomilnacipran Linezolid MAOIs like Carbex, Eldepryl, [place], Marplan, Nardil, and Parnate Methylene blue (injected into a vein) Milnacipran Safinamide Thioridazine Venlafaxine Viloxazine This  medication may also interact with the following: Alcohol Amphetamines Aspirin and aspirin-like medications Certain antibiotics like ciprofloxacin and enoxacin Certain medications for blood pressure, heart disease, irregular heart beat Certain medications for depression, anxiety, or psychotic disturbances Certain medications for migraine headache like almotriptan, eletriptan, frovatriptan, naratriptan, rizatriptan, sumatriptan, zolmitriptan Certain medications that treat or prevent blood clots like warfarin, enoxaparin, and dalteparin Cimetidine Fentanyl Lithium NSAIDS, medications for pain and inflammation, like ibuprofen or naproxen Phentermine Procarbazine Rasagiline Sibutramine St. John's wort Theophylline Tramadol Tryptophan This list may not describe all possible interactions. Give your health care provider a list of all the medicines, herbs, non-prescription drugs, or dietary supplements you use. Also tell them if you smoke, drink alcohol, or use illegaldrugs. Some items may interact with your medicine. What should I watch for while using this medication? Tell your care team if your symptoms do not get better or if they get worse. Visit your care team for regular checks on your progress. Because it may take several weeks to see the full effects of this medication, it is important tocontinue your treatment as prescribed by your care team. This medication may cause serious skin reactions. They can happen weeks to months after starting the medication. Contact your care team right away if you notice fevers or flu-like symptoms with a rash. The rash may be red or purple and then turn into blisters or peeling of the skin. Or, you  might notice a red rash with swelling of the face, lips, or lymph nodes in your neck or under yourarms. Watch for new or worsening thoughts of suicide or depression. This includes sudden changes in mood, behaviors, or thoughts. These changes can happen at any time  but are more common in the beginning of treatment or after a change in dose. Call your care team right away if you experience these thoughts orworsening depression. Manic episodes may happen in patients with bipolar disorder who take this medication. Watch for changes in feelings or behaviors such as feeling anxious, nervous, agitated, panicky, irritable, hostile, aggressive, impulsive, severely restless, overly excited and hyperactive, or trouble sleeping. These symptoms can happen at any time, but are more common in the beginning of treatment or after a change in dose. Call your care team right away if you notice any ofthese symptoms. You may get drowsy or dizzy. Do not drive, use machinery, or do anything that needs mental alertness until you know how this medication affects you. Do not stand or sit up quickly, especially if you are an older patient. This reduces the risk of dizzy or fainting spells. Alcohol may interfere with the effect ofthis medication. Avoid alcoholic drinks. This medication may increase blood sugar. The risk may be higher in patients who already have diabetes. Ask your care team what you can do to lower yourrisk of diabetes while taking this medication. This medication can cause an increase in blood pressure. This medication can also cause a sudden drop in your blood pressure, which may make you feel faint and increase the chance of a fall. These effects are most common when you first start the medication or when the dose is increased, or during use of other medications that can cause a sudden drop in blood pressure. Check with your care team for instructions on monitoring your blood pressure while taking thismedication. Your mouth may get dry. Chewing sugarless gum or sucking hard candy, and drinking plenty of water, may help. Contact your care team if the problem doesnot go away or is severe. What side effects may I notice from receiving this medication? Side effects that you should  report to your care team as soon as possible: Allergic reactions-skin rash, itching, hives, swelling of the face, lips, tongue, or throat Bleeding-bloody or black, tar-like stools, red or dark brown urine, vomiting blood or brown material that looks like coffee grounds, small, red or purple spots on skin, unusual bleeding or bruising Increase in blood pressure Liver injury-right upper belly pain, loss of appetite, nausea, light-colored stool, dark yellow or brown urine, yellowing skin or eyes, unusual weakness or fatigue Low sodium level-muscle weakness, fatigue, dizziness, headache, confusion Redness, blistering, peeling, or loosening of the skin, including inside the mouth Serotonin syndrome-irritability, confusion, fast or irregular heartbeat, muscle stiffness, twitching muscles, sweating, high fever, seizures, chills, vomiting, diarrhea Sudden eye pain or change in vision such as blurry vision, seeing halos around lights, vision loss Thoughts of suicide or self-harm, worsening mood, feelings of depression Trouble passing urine Side effects that usually do not require medical attention (report to your careteam if they continue or are bothersome): Change in sex drive or performance Constipation Diarrhea Dizziness Dry mouth Excessive sweating Loss of appetite Nausea Vomiting This list may not describe all possible side effects. Call your doctor for medical advice about side effects. You may report side effects to FDA at1-800-FDA-1088. Where should I keep my medication? Keep out of the reach of children and pets. Store  at room temperature between 15 and 30 degrees C (59 to 86 degrees F). Getrid of any unused medication after the expiration date. To get rid of medications that are no longer needed or have expired: Take the medication to a medication take-back program. Check with your pharmacy or law enforcement to find a location. If you cannot return the medication, check the label or  package insert to see if the medication should be thrown out in the garbage or flushed down the toilet. If you are not sure, ask your care team. If it is safe to put it in the trash, take the medication out of the container. Mix the medication with cat litter, dirt, coffee grounds, or other unwanted substance. Seal the mixture in a bag or container. Put it in the trash. NOTE: This sheet is a summary. It may not cover all possible information. If you have questions about this medicine, talk to your doctor, pharmacist, orhealth care provider.  2022 Elsevier/Gold Standard (2020-11-16 12:32:25)

## 2021-07-09 ENCOUNTER — Encounter: Payer: Self-pay | Admitting: Adult Health

## 2021-07-09 DIAGNOSIS — D509 Iron deficiency anemia, unspecified: Secondary | ICD-10-CM | POA: Insufficient documentation

## 2021-07-09 LAB — CBC WITH DIFFERENTIAL/PLATELET
Absolute Monocytes: 638 cells/uL (ref 200–950)
Basophils Absolute: 40 cells/uL (ref 0–200)
Basophils Relative: 0.7 %
Eosinophils Absolute: 222 cells/uL (ref 15–500)
Eosinophils Relative: 3.9 %
HCT: 34.6 % — ABNORMAL LOW (ref 38.5–50.0)
Hemoglobin: 12 g/dL — ABNORMAL LOW (ref 13.2–17.1)
Lymphs Abs: 1459 cells/uL (ref 850–3900)
MCH: 29.9 pg (ref 27.0–33.0)
MCHC: 34.7 g/dL (ref 32.0–36.0)
MCV: 86.3 fL (ref 80.0–100.0)
MPV: 11.5 fL (ref 7.5–12.5)
Monocytes Relative: 11.2 %
Neutro Abs: 3340 cells/uL (ref 1500–7800)
Neutrophils Relative %: 58.6 %
Platelets: 203 10*3/uL (ref 140–400)
RBC: 4.01 10*6/uL — ABNORMAL LOW (ref 4.20–5.80)
RDW: 13.1 % (ref 11.0–15.0)
Total Lymphocyte: 25.6 %
WBC: 5.7 10*3/uL (ref 3.8–10.8)

## 2021-07-09 LAB — GAMMA GT: GGT: 41 U/L (ref 3–70)

## 2021-07-09 LAB — COMPLETE METABOLIC PANEL WITH GFR
AG Ratio: 2 (calc) (ref 1.0–2.5)
ALT: 50 U/L — ABNORMAL HIGH (ref 9–46)
AST: 29 U/L (ref 10–35)
Albumin: 3.9 g/dL (ref 3.6–5.1)
Alkaline phosphatase (APISO): 70 U/L (ref 35–144)
BUN: 23 mg/dL (ref 7–25)
CO2: 26 mmol/L (ref 20–32)
Calcium: 9.2 mg/dL (ref 8.6–10.3)
Chloride: 105 mmol/L (ref 98–110)
Creat: 1.16 mg/dL (ref 0.70–1.22)
Globulin: 2 g/dL (calc) (ref 1.9–3.7)
Glucose, Bld: 140 mg/dL — ABNORMAL HIGH (ref 65–99)
Potassium: 4.2 mmol/L (ref 3.5–5.3)
Sodium: 141 mmol/L (ref 135–146)
Total Bilirubin: 0.7 mg/dL (ref 0.2–1.2)
Total Protein: 5.9 g/dL — ABNORMAL LOW (ref 6.1–8.1)
eGFR: 64 mL/min/{1.73_m2} (ref >=60)

## 2021-07-09 LAB — TEST AUTHORIZATION

## 2021-07-09 LAB — URIC ACID: Uric Acid, Serum: 5 mg/dL (ref 4.0–8.0)

## 2021-07-09 LAB — IRON,TIBC AND FERRITIN PANEL
%SAT: 14 % (calc) — ABNORMAL LOW (ref 20–48)
Ferritin: 57 ng/mL (ref 24–380)
Iron: 42 ug/dL — ABNORMAL LOW (ref 50–180)
TIBC: 295 mcg/dL (calc) (ref 250–425)

## 2021-07-20 DIAGNOSIS — N182 Chronic kidney disease, stage 2 (mild): Secondary | ICD-10-CM | POA: Diagnosis not present

## 2021-07-20 DIAGNOSIS — I480 Paroxysmal atrial fibrillation: Secondary | ICD-10-CM | POA: Diagnosis not present

## 2021-07-20 DIAGNOSIS — I69393 Ataxia following cerebral infarction: Secondary | ICD-10-CM | POA: Diagnosis not present

## 2021-07-20 DIAGNOSIS — E1122 Type 2 diabetes mellitus with diabetic chronic kidney disease: Secondary | ICD-10-CM | POA: Diagnosis not present

## 2021-07-20 DIAGNOSIS — I69351 Hemiplegia and hemiparesis following cerebral infarction affecting right dominant side: Secondary | ICD-10-CM | POA: Diagnosis not present

## 2021-07-20 DIAGNOSIS — E1141 Type 2 diabetes mellitus with diabetic mononeuropathy: Secondary | ICD-10-CM | POA: Diagnosis not present

## 2021-07-22 NOTE — Progress Notes (Signed)
Assessment and Plan:  Earl [name] was seen today for acute visit.  Diagnoses and all orders for this visit:  Peripheral edema/ Dyspnea on Exertion Edema 2+ pitting of both lower legs from foot to knee Some increased fatigue and shortness of breath on exertion Continue exercises, decreased sodium diet, compression socks and elevating feet as much as possible throughout the day Continue Lasix BID and have pt follow up on Tuesday Go to ER if chest pain, increased shortness of breath, dizziness, extreme fatigue -     BMP - BNP stat,  - Pt is established with Dr. Caryl Comes and will make appointment for reevaluation  Paroxysmal Atrial Fibrillation  Continue medications, rate controlled, schedule follow up appointment with Dr. Caryl Comes, cardiology  Acquired thrombophilia ([place]) Continue eliquis  Essential hypertension      - continue medications, DASH diet, exercise and monitor at home. Call if greater than 130/80.  BP is running on low side . Lasix is only medication currently but is needing to increase due to edema.  Advised daughter to monitor BP  Further disposition pending results of labs. Discussed med's effects and SE's.   Over 45 minutes of exam, counseling, chart review, and critical decision making was performed.   Future Appointments  Date Time Provider [place]  07/27/2021  2:30 PM Magda Bernheim, NP [facility] None  09/10/2021 11:00 AM [name], MD [facility] None  09/14/2021 10:00 AM Marzetta Board, DPM [facility]  12/22/2021 10:00 AM [facility] None  12/29/2021 10:30 AM Wyatt Portela, MD [facility] None  05/17/2022 11:00 AM Liane Comber, NP [facility] None    ------------------------------------------------------------------------------------------------------------------   HPI BP 110/60   Pulse 100   Temp 97.7 F (36.5 C)   Wt 183 lb (83 kg)   SpO2 98%   BMI 25.52 kg/m  80 y.o.male with T2DM and neuropathy, PAF on  eliquis, 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   Reports increased swelling of bilateral feet/lower legs x 3 days, had increased swelling previously this year and increased Lasix for 3 days and walking, feet elevation and it resolved, states edema is worse this time.  R is chronically mildly edematous following CVA, left is new. He has had some minor shortness of breath on exertion and felt slightly more fatigued. He wears compression hose and takes lasix 40 mg daily. He watches sodium intake. He has been following stricter diet of less salt, has increased exercises, has been elevating feet and wearing compression hose regularly.  He does not have CHF hx, denies cough, CP,  PND.   He has T2DM, typically well controlled A1C <7%. Last A1C in the office was:  Lab Results  Component Value Date   HGBA1C 5.9 (H) 05/17/2021    BMI is Body mass index is 25.52 kg/m., he has been working on diet, no exercise in 2 weeks. Daughter is requesting referral for home PT, he does not drive, but ongoing unsteady gait following CVA with residual R weakness, has had some falls.  Wt Readings from Last 3 Encounters:  07/23/21 183 lb (83 kg)  07/07/21 182 lb 9.6 oz (82.8 kg)  05/17/21 173 lb (78.5 kg)   BP Readings from Last 3 Encounters:  07/23/21 110/60  07/07/21 122/76  05/17/21 126/68   Currently not on BP medication other than Lasix which he has had to increase to BID due to edema of lower legs bilaterally. Denies dizziness, headaches.  Has  been experiencing some mild fatigue  Past Medical History:  Diagnosis Date   Adenomatous colon polyp    Allergy    Anal fissure    Arthritis    BPH (benign prostatic hyperplasia)    Diabetes mellitus (HCC)    GERD (gastroesophageal reflux disease)    Hyperlipidemia    Hypertension    Hypothyroidism    Other testicular hypofunction    Stroke (HCC)      Allergies  Allergen Reactions   Penicillins  Other (See Comments)    Current Outpatient Medications on File Prior to Visit  Medication Sig   atorvastatin (LIPITOR) 40 MG tablet TAKE 1 TABLET BY MOUTH  DAILY FOR CHOLESTEROL   Blood Glucose Monitoring Suppl (ONE TOUCH ULTRA 2) w/Device KIT Check blood sugar 1 time a day-DX-E11.9   Cholecalciferol (VITAMIN D PO) Take 1 tablet by mouth daily.    Cyanocobalamin (B-12) 50 MCG TABS Take 50 mcg by mouth daily.   DULoxetine (CYMBALTA) 20 MG capsule Take 1 cap daily for mood and pain.   ELIQUIS 5 MG TABS tablet TAKE 1 TABLET BY MOUTH  TWICE DAILY TO PREVENT  BLOOD CLOTS   finasteride (PROSCAR) 5 MG tablet TAKE 1 TABLET BY MOUTH  DAILY FOR PROSTATE   gabapentin (NEURONTIN) 300 MG capsule Take  1 to 2 capsules  1 hour before Bedtime  for Diabetic Neuropathy pains /Patient knows to take by mouth   glipiZIDE (GLUCOTROL) 5 MG tablet Take 1/2 tablet  2 x /day  with Meals  for Diabetes   glucose blood (ONETOUCH ULTRA) test strip CHECK BLOOD SUGAR ONCE A DAY.   Lancets (ONETOUCH DELICA PLUS [id number]) MISC CHECK BLOOD SUGAR ONCE A DAY.   Lancets Misc. (ONE TOUCH SURESOFT) MISC Check blood sugar 1 time a day-DX-E11.91   levothyroxine (SYNTHROID) 50 MCG tablet TAKE WHOLE TAB SUN/TUES/THURS, TAKE 1/2 TAB ALL OTHER DAYS. TAKE IN THE MORNING ON AN EMPTY STOMACH WITH ONLY WATER 30 MINUTES BEFORE BREAKFAST AND NO ANTACID MEDS, CALCIUM OR MAGNESIUM FOR 4 HOURS AND AVOID BIOTIN   Magnesium 250 MG TABS Take 250 mg by mouth daily.    metFORMIN (GLUCOPHAGE-XR) 500 MG 24 hr tablet TAKE 2 TABLETS BY MOUTH 2  TIMES DAILY WITH MEALS FOR  DIABETES   Multiple Vitamin (MULTIVITAMIN) capsule Take 1 capsule by mouth daily.   triamcinolone ointment (KENALOG) 0.1 % Apply topically 2 (two) times daily.   trolamine salicylate (ASPERCREME) 10 % cream Apply 1 application topically as needed.   vitamin C (ASCORBIC ACID) 500 MG tablet Take 500 mg by mouth daily.   furosemide (LASIX) 40 MG tablet Take 1 tablet (40 mg total) by mouth  daily.   sodium chloride (OCEAN) 0.65 % nasal spray Place 2 sprays into the nose as needed for congestion.   No current facility-administered medications on file prior to visit.   Allergies:  Allergies  Allergen Reactions   Penicillins Other (See Comments)   Medical History:  has Paroxysmal atrial fibrillation (HCC); MGUS (monoclonal gammopathy of unknown significance); Essential hypertension; Hyperlipidemia associated with type 2 diabetes mellitus ([address]); Type 2 diabetes mellitus (HCC); BPH (benign prostatic hyperplasia); Medication management; Vitamin D deficiency; Hypothyroidism; History of CVA with residual deficit; Ataxia, post-stroke; Chronic anticoagulation; CKD stage 2 due to type 2 diabetes mellitus ([place]); Late effect of cerebrovascular accident (CVA); Acquired thrombophilia ([place]); Abdominal aortic atherosclerosis ([place]) by Abd CTscvn  2016; Neurologic gait disorder; Dizziness; History of loop recorder; Diabetic 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 ([place]); Depressed  mood; Pain due to onychomycosis of toenail of left foot; and Iron deficiency anemia on their problem list. Surgical History:  He  has a past surgical history that includes Inguinal hernia repair (Bilateral); TEE without cardioversion (N/A, 07/28/2017); LOOP RECORDER INSERTION (N/A, 07/28/2017); Colonoscopy; and Circumcision (N/A, 06/11/2019). Family History:  Hisfamily history includes Colon cancer in his father; Diabetes in his father; Sickle cell anemia in his mother; Stomach cancer in his father. Social History:   reports that he has never smoked. He has never used smokeless tobacco. He reports that he does not drink alcohol and does not use drugs.   ROS: all negative except above.   Physical Exam:  BP 110/60   Pulse 100   Temp 97.7 F (36.5 C)   Wt 183 lb (83 kg)   SpO2 98%   BMI 25.52 kg/m   General Appearance: Well nourished, well dressed AA elder in no apparent distress. Eyes: PERRLA, EOMs,  conjunctiva no swelling or erythema Sinuses: No Frontal/maxillary tenderness ENT/Mouth: Ext aud canals clear, TMs without erythema, bulging. No erythema, swelling, or exudate on post pharynx.  Tonsils not swollen or erythematous. Mildly HOH.  Neck: Supple, thyroid normal.  Respiratory: Respiratory effort normal, BS equal bilaterally without rales, rhonchi, wheezing or stridor.  Cardio: Regular with mild murmur. Intact peipheral pulses, bil 2+ pitting edema feet/ankles/lower legs and knees Abdomen: Soft, + BS.  Non tender, no guarding, rebound, hernias, masses. Lymphatics: Non tender without lymphadenopathy.  Musculoskeletal: Strength fairly intact except 4/5 RLE; slow antalgic gait with cane. Skin: Warm, dry; left toenail thickened; distal skin is hardened/very calloused.[name] No erythema or discharge.  Neuro: Normal muscle tone, Sensation intact to light touch bil feet.  Psych: Awake and oriented X 3, normal affect, Insight and Judgment appropriate.     Magda Bernheim, NP 12:09 PM [facility]

## 2021-07-23 ENCOUNTER — Encounter: Payer: Medicare Other | Admitting: Internal Medicine

## 2021-07-23 ENCOUNTER — Other Ambulatory Visit: Payer: Self-pay

## 2021-07-23 ENCOUNTER — Telehealth: Payer: Self-pay | Admitting: Internal Medicine

## 2021-07-23 ENCOUNTER — Ambulatory Visit (INDEPENDENT_AMBULATORY_CARE_PROVIDER_SITE_OTHER): Payer: Medicare Other | Admitting: Nurse Practitioner

## 2021-07-23 ENCOUNTER — Emergency Department (HOSPITAL_COMMUNITY)
Admission: EM | Admit: 2021-07-23 | Discharge: 2021-07-24 | Disposition: A | Discharge status: Home / Self Care | Payer: Medicare Other | Attending: Emergency Medicine | Admitting: Emergency Medicine

## 2021-07-23 ENCOUNTER — Encounter (HOSPITAL_COMMUNITY): Payer: Self-pay

## 2021-07-23 ENCOUNTER — Emergency Department (HOSPITAL_COMMUNITY): Payer: Medicare Other

## 2021-07-23 ENCOUNTER — Encounter: Payer: Self-pay | Admitting: Nurse Practitioner

## 2021-07-23 VITALS — BP 110/60 | HR 100 | Temp 97.7°F | Wt 183.0 lb

## 2021-07-23 DIAGNOSIS — R0609 Other forms of dyspnea: Secondary | ICD-10-CM

## 2021-07-23 DIAGNOSIS — E039 Hypothyroidism, unspecified: Secondary | ICD-10-CM | POA: Insufficient documentation

## 2021-07-23 DIAGNOSIS — D6869 Other thrombophilia: Secondary | ICD-10-CM | POA: Diagnosis not present

## 2021-07-23 DIAGNOSIS — N182 Chronic kidney disease, stage 2 (mild): Secondary | ICD-10-CM | POA: Insufficient documentation

## 2021-07-23 DIAGNOSIS — Z7984 Long term (current) use of oral hypoglycemic drugs: Secondary | ICD-10-CM | POA: Diagnosis not present

## 2021-07-23 DIAGNOSIS — I130 Hypertensive heart and chronic kidney disease with heart failure and stage 1 through stage 4 chronic kidney disease, or unspecified chronic kidney disease: Secondary | ICD-10-CM | POA: Diagnosis not present

## 2021-07-23 DIAGNOSIS — E785 Hyperlipidemia, unspecified: Secondary | ICD-10-CM | POA: Insufficient documentation

## 2021-07-23 DIAGNOSIS — I509 Heart failure, unspecified: Secondary | ICD-10-CM | POA: Diagnosis not present

## 2021-07-23 DIAGNOSIS — M7989 Other specified soft tissue disorders: Secondary | ICD-10-CM | POA: Diagnosis present

## 2021-07-23 DIAGNOSIS — R0600 Dyspnea, unspecified: Secondary | ICD-10-CM | POA: Diagnosis not present

## 2021-07-23 DIAGNOSIS — I110 Hypertensive heart disease with heart failure: Secondary | ICD-10-CM | POA: Diagnosis not present

## 2021-07-23 DIAGNOSIS — R609 Edema, unspecified: Secondary | ICD-10-CM

## 2021-07-23 DIAGNOSIS — E1169 Type 2 diabetes mellitus with other specified complication: Secondary | ICD-10-CM | POA: Insufficient documentation

## 2021-07-23 DIAGNOSIS — E1140 Type 2 diabetes mellitus with diabetic neuropathy, unspecified: Secondary | ICD-10-CM | POA: Diagnosis not present

## 2021-07-23 DIAGNOSIS — J90 Pleural effusion, not elsewhere classified: Secondary | ICD-10-CM | POA: Diagnosis not present

## 2021-07-23 DIAGNOSIS — I517 Cardiomegaly: Secondary | ICD-10-CM | POA: Diagnosis not present

## 2021-07-23 DIAGNOSIS — R600 Localized edema: Secondary | ICD-10-CM | POA: Diagnosis not present

## 2021-07-23 DIAGNOSIS — I480 Paroxysmal atrial fibrillation: Secondary | ICD-10-CM

## 2021-07-23 DIAGNOSIS — R0602 Shortness of breath: Secondary | ICD-10-CM | POA: Diagnosis not present

## 2021-07-23 DIAGNOSIS — Z7901 Long term (current) use of anticoagulants: Secondary | ICD-10-CM | POA: Diagnosis not present

## 2021-07-23 DIAGNOSIS — J9811 Atelectasis: Secondary | ICD-10-CM | POA: Diagnosis not present

## 2021-07-23 LAB — CBC WITH DIFFERENTIAL/PLATELET
Abs Immature Granulocytes: 0.02 10*3/uL (ref 0.00–0.07)
Basophils Absolute: 0 10*3/uL (ref 0.0–0.1)
Basophils Relative: 1 %
Eosinophils Absolute: 0.2 10*3/uL (ref 0.0–0.5)
Eosinophils Relative: 3 %
HCT: 36.1 % — ABNORMAL LOW (ref 39.0–52.0)
Hemoglobin: 12.1 g/dL — ABNORMAL LOW (ref 13.0–17.0)
Immature Granulocytes: 0 %
Lymphocytes Relative: 28 %
Lymphs Abs: 1.6 10*3/uL (ref 0.7–4.0)
MCH: 29.8 pg (ref 26.0–34.0)
MCHC: 33.5 g/dL (ref 30.0–36.0)
MCV: 88.9 fL (ref 80.0–100.0)
Monocytes Absolute: 0.5 10*3/uL (ref 0.1–1.0)
Monocytes Relative: 9 %
Neutro Abs: 3.4 10*3/uL (ref 1.7–7.7)
Neutrophils Relative %: 59 %
Platelets: 186 10*3/uL (ref 150–400)
RBC: 4.06 MIL/uL — ABNORMAL LOW (ref 4.22–5.81)
RDW: 14.4 % (ref 11.5–15.5)
WBC: 5.7 10*3/uL (ref 4.0–10.5)
nRBC: 0 % (ref 0.0–0.2)

## 2021-07-23 LAB — BASIC METABOLIC PANEL WITH GFR
BUN: 23 mg/dL (ref 7–25)
CO2: 28 mmol/L (ref 20–32)
Calcium: 9 mg/dL (ref 8.6–10.3)
Chloride: 104 mmol/L (ref 98–110)
Creat: 1.12 mg/dL (ref 0.70–1.22)
Glucose, Bld: 194 mg/dL — ABNORMAL HIGH (ref 65–99)
Potassium: 4.9 mmol/L (ref 3.5–5.3)
Sodium: 138 mmol/L (ref 135–146)
eGFR: 66 mL/min/{1.73_m2} (ref >=60)

## 2021-07-23 LAB — BRAIN NATRIURETIC PEPTIDE: Brain Natriuretic Peptide: 762 pg/mL — ABNORMAL HIGH (ref <100)

## 2021-07-23 LAB — TROPONIN I (HIGH SENSITIVITY)
Troponin I (High Sensitivity): 53 ng/L — ABNORMAL HIGH (ref <18)
Troponin I (High Sensitivity): 56 ng/L — ABNORMAL HIGH (ref <18)

## 2021-07-23 MED ORDER — FUROSEMIDE 10 MG/ML IJ SOLN
40.0000 mg | Freq: Once | INTRAMUSCULAR | Status: AC
  Administered 2021-07-23: 40 mg via INTRAVENOUS
  Filled 2021-07-23: qty 4

## 2021-07-23 NOTE — Progress Notes (Deleted)
Assessment and Plan:  There are no diagnoses linked to this encounter.    Further disposition pending results of labs. Discussed med's effects and SE's.   Over 30 minutes of exam, counseling, chart review, and critical decision making was performed.   Future Appointments  Date Time Provider [place]  07/27/2021  2:30 PM Magda Bernheim, NP [facility] None  09/10/2021 11:00 AM [name], MD [facility] None  09/14/2021 10:00 AM Marzetta Board, DPM [facility]  12/22/2021 10:00 AM [facility] None  12/29/2021 10:30 AM Wyatt Portela, MD [facility] None  05/17/2022 11:00 AM Liane Comber, NP [facility] None    ------------------------------------------------------------------------------------------------------------------   HPI There were no vitals taken for this visit. 80 y.o.male presents for  Past Medical History:  Diagnosis Date   Adenomatous colon polyp    Allergy    Anal fissure    Arthritis    BPH (benign prostatic hyperplasia)    Diabetes mellitus (HCC)    GERD (gastroesophageal reflux disease)    Hyperlipidemia    Hypertension    Hypothyroidism    Other testicular hypofunction    Stroke (HCC)      Allergies  Allergen Reactions   Penicillins Other (See Comments)    Current Outpatient Medications on File Prior to Visit  Medication Sig   atorvastatin (LIPITOR) 40 MG tablet TAKE 1 TABLET BY MOUTH  DAILY FOR CHOLESTEROL   Blood Glucose Monitoring Suppl (ONE TOUCH ULTRA 2) w/Device KIT Check blood sugar 1 time a day-DX-E11.9   Cholecalciferol (VITAMIN D PO) Take 1 tablet by mouth daily.    Cyanocobalamin (B-12) 50 MCG TABS Take 50 mcg by mouth daily.   DULoxetine (CYMBALTA) 20 MG capsule Take 1 cap daily for mood and pain.   ELIQUIS 5 MG TABS tablet TAKE 1 TABLET BY MOUTH  TWICE DAILY TO PREVENT  BLOOD CLOTS   finasteride (PROSCAR) 5 MG tablet TAKE 1 TABLET BY MOUTH  DAILY FOR PROSTATE   furosemide (LASIX) 40 MG tablet  Take 1 tablet (40 mg total) by mouth daily.   gabapentin (NEURONTIN) 300 MG capsule Take  1 to 2 capsules  1 hour before Bedtime  for Diabetic Neuropathy pains /Patient knows to take by mouth   glipiZIDE (GLUCOTROL) 5 MG tablet Take 1/2 tablet  2 x /day  with Meals  for Diabetes   glucose blood (ONETOUCH ULTRA) test strip CHECK BLOOD SUGAR ONCE A DAY.   Lancets (ONETOUCH DELICA PLUS [id number]) MISC CHECK BLOOD SUGAR ONCE A DAY.   Lancets Misc. (ONE TOUCH SURESOFT) MISC Check blood sugar 1 time a day-DX-E11.91   levothyroxine (SYNTHROID) 50 MCG tablet TAKE WHOLE TAB SUN/TUES/THURS, TAKE 1/2 TAB ALL OTHER DAYS. TAKE IN THE MORNING ON AN EMPTY STOMACH WITH ONLY WATER 30 MINUTES BEFORE BREAKFAST AND NO ANTACID MEDS, CALCIUM OR MAGNESIUM FOR 4 HOURS AND AVOID BIOTIN   Magnesium 250 MG TABS Take 250 mg by mouth daily.    metFORMIN (GLUCOPHAGE-XR) 500 MG 24 hr tablet TAKE 2 TABLETS BY MOUTH 2  TIMES DAILY WITH MEALS FOR  DIABETES   Multiple Vitamin (MULTIVITAMIN) capsule Take 1 capsule by mouth daily.   sodium chloride (OCEAN) 0.65 % nasal spray Place 2 sprays into the nose as needed for congestion.   triamcinolone ointment (KENALOG) 0.1 % Apply topically 2 (two) times daily.   trolamine salicylate (ASPERCREME) 10 % cream Apply 1 application topically as needed.   vitamin C (ASCORBIC ACID) 500 MG tablet Take 500 mg by mouth daily.  No current facility-administered medications on file prior to visit.    ROS: all negative except above.   Physical Exam:  There were no vitals taken for this visit.  General Appearance: Well nourished, in no apparent distress. Eyes: PERRLA, EOMs, conjunctiva no swelling or erythema Sinuses: No Frontal/maxillary tenderness ENT/Mouth: Ext aud canals clear, TMs without erythema, bulging. No erythema, swelling, or exudate on post pharynx.  Tonsils not swollen or erythematous. Hearing normal.  Neck: Supple, thyroid normal.  Respiratory: Respiratory effort normal, BS  equal bilaterally without rales, rhonchi, wheezing or stridor.  Cardio: RRR with no MRGs. Brisk peripheral pulses without edema.  Abdomen: Soft, + BS.  Non tender, no guarding, rebound, hernias, masses. Lymphatics: Non tender without lymphadenopathy.  Musculoskeletal: Full ROM, 5/5 strength, normal gait.  Skin: Warm, dry without rashes, lesions, ecchymosis.  Neuro: Cranial nerves intact. Normal muscle tone, no cerebellar symptoms. Sensation intact.  Psych: Awake and oriented X 3, normal affect, Insight and Judgment appropriate.     Magda Bernheim, NP 12:37 PM [facility]

## 2021-07-23 NOTE — ED Provider Notes (Signed)
Emergency Medicine Provider Triage Evaluation Note  Earl [name] , a 80 y.o. male  was evaluated in triage.  Pt complains of elevated bnp, leg swelling and shortness of breath.  Pt has labs with him   Review of Systems  Positive: Short of breaht Negative: No fever  Physical Exam  BP 123/80 (BP Location: Right Arm)   Pulse 89   Temp 98.7 F (37.1 C) (Oral)   Resp 16   Ht '5\' 11"'$  (1.803 m)   Wt 83 kg   SpO2 100%   BMI 25.52 kg/m  Gen:   Awake, no distress   Resp:  Normal effort  MSK:   Moves extremities without difficulty  Other:   Medical Decision Making  Medically screening exam initiated at 6:59 PM.  Appropriate orders placed.  Earl [name] was informed that the remainder of the evaluation will be completed by another provider, this initial triage assessment does not replace that evaluation, and the importance of remaining in the ED until their evaluation is complete.     Earl Meadow, PA-C 07/23/21 [place], [place], DO 07/23/21 2259

## 2021-07-23 NOTE — Patient Instructions (Signed)
Go to ER if chest pain, very irregular heart beat, headache, dizziness, severe shortness of breath, severe fatigue or peripheral edema worsens Continue Lasix twice a day and follow up on Tuesday Make appointment with Dr Caryl Comes  Peripheral Edema Peripheral edema is swelling that is caused by a buildup of fluid. Peripheral edema most often affects the lower legs, ankles, and feet. It can also develop in the arms, hands, and face. The area of the body that has peripheral edema will look swollen. It may also feel heavy or warm. Your clothes may start to feel tight. Pressing on the area may make a temporary dent in your skin. You may not be able to move your swollen arm or leg as much as usual. There are many causes of peripheral edema. It can happen because of a complication of other conditions such as congestive heart failure, kidney disease, or a problem with your blood circulation. It also can be a side effect of certain medicines or because of an infection. It often happens to women during pregnancy. Sometimes, the cause is not known. Follow these instructions at home: Managing pain, stiffness, and swelling  Raise (elevate) your legs while you are sitting or lying down. Move around often to prevent stiffness and to lessen swelling. Do not sit or stand for long periods of time. Wear support stockings as told by your health care provider. Medicines Take over-the-counter and prescription medicines only as told by your health care provider. Your health care provider may prescribe medicine to help your body get rid of excess water (diuretic). General instructions Pay attention to any changes in your symptoms. Follow instructions from your health care provider about limiting salt (sodium) in your diet. Sometimes, eating less salt may reduce swelling. Moisturize skin daily to help prevent skin from cracking and draining. Keep all follow-up visits as told by your health care provider. This is  important. Contact a health care provider if you have: A fever. Edema that starts suddenly or is getting worse, especially if you are pregnant or have a medical condition. Swelling in only one leg. Increased swelling, redness, or pain in one or both of your legs. Drainage or sores at the area where you have edema. Get help right away if you: Develop shortness of breath, especially when you are lying down. Have pain in your chest or abdomen. Feel weak. Feel faint. Summary Peripheral edema is swelling that is caused by a buildup of fluid. Peripheral edema most often affects the lower legs, ankles, and feet. Move around often to prevent stiffness and to lessen swelling. Do not sit or stand for long periods of time. Pay attention to any changes in your symptoms. Contact a health care provider if you have edema that starts suddenly or is getting worse, especially if you are pregnant or have a medical condition. Get help right away if you develop shortness of breath, especially when lying down. This information is not intended to replace advice given to you by your health care provider. Make sure you discuss any questions you have with your health care provider. Document Revised: 08/01/2018 Document Reviewed: 08/01/2018 Elsevier Patient Education  2022 [employer].

## 2021-07-23 NOTE — ED Triage Notes (Signed)
Patient was instructed to come to the ED for an abnormal lab. BNP-792 and glucose 194.  Patient c/o bilateral lower extremity swelling x 1 week. Patient started on Lasix and that did not help. Patient's daughter states the patient was told to increase dose to 40 mg. Patieant c/o slight SOB. Patient able to talk in complete sentences.

## 2021-07-23 NOTE — Telephone Encounter (Signed)
Left message for patient to call back.   Patient saw provider at his doctor's office today, who increased patient's Lasix. Patient is also going back to that provider on 07/27/21 for follow up. They advised patient to follow up with Dr. Caryl Comes as well, see office note in chart.

## 2021-07-23 NOTE — Telephone Encounter (Signed)
Patient called back. Informed them that Dr. Caryl Comes is not in the office this week, but a message would be sent to him and his nurse to see about getting him in for an office visit in the near future. Patient is willing to travel to [facility] for an office visit if need be.

## 2021-07-23 NOTE — Telephone Encounter (Signed)
This was sent Via MYChart to the sching pool:    This message is being sent by Corwin Levins on behalf of Quinntin Miedema.  If swelling, where is the swelling located? >> From feet to knees. Previous week, only mid-calf. For reference the right foot/leg area has been swollen before, and he (Dad) believes it is related to his stroke a few years ago. But the extent is more than previous, and historically little to no swelling within the left leg.   How much weight have you gained and in what time span? >>  Gained possibly 5 to 6 lbs    Have you gained 3 pounds in a day or 5 pounds in a week? >> No.   Do you have a log of your daily weights (if so, list)? >>  No. Does not keep a log  Are you currently taking a fluid pill?  Yes. Generic for Lasix ('40MG'$  tablet) Usually once a day - in the morning, but for this "second round" for addressing the swelling the doctor's office since Aug 31 (Wed) started taking  2 pills a day - will continue until next appt Sept 6. Previous was only two days.   Are you currently SOB? >> There is some shortness of breath after typical activities, that wasn't the case previously.   Have you traveled recently? >> No (for out of the country as well)

## 2021-07-23 NOTE — Discharge Instructions (Addendum)
Increase your lasix to '40mg'$  once daily for 3 days and follow up closely with your PCP or Cardiologist.

## 2021-07-23 NOTE — ED Provider Notes (Signed)
[facility] DEPT Provider Note   CSN: [id number] Arrival date & time: 07/23/21  1752     History Chief Complaint  Patient presents with   Abnormal Lab    Earl [name] is a 80 y.o. male.  HPI      10 days of increasing leg swelling Tried lasix, had been on $Remov'20mg'TeAhdn$  for a long time, went to $Remov'40mg'sdMUPt$  10 days ago, 2 days then stopped and now 3 days taking $RemoveBef'40mg'YJAJfIvrFr$   Dyspnea on exertion, about 60ft.  Pain of foot also. Not really having dyspnea when laying down fat, occasionally will have altered breathing when laying down over the last 4 mos Wife passed 14yrs ago around this time, they were married 43yrs No chest pain o rpressure, no neck or shoulder pain No cough, fever, abdominal pain, nausea, vomiting No hx of known CHF PCP sent him here due to swelling and elevated BNP 700   Past Medical History:  Diagnosis Date   Adenomatous colon polyp    Allergy    Anal fissure    Arthritis    BPH (benign prostatic hyperplasia)    Diabetes mellitus ([place])    GERD (gastroesophageal reflux disease)    Hyperlipidemia    Hypertension    Hypothyroidism    Other testicular hypofunction    Stroke [facility])     Patient Active Problem List   Diagnosis Date Noted   Iron deficiency anemia 07/09/2021   Depressed mood 07/07/2021   Pain due to onychomycosis of toenail of left foot 07/07/2021   Diabetic 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 ([place]) 05/17/2021   History of loop recorder 05/06/2021   Dizziness 05/12/2020   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   Acquired thrombophilia ([place]) 09/09/2019   Abdominal aortic atherosclerosis ([place]) by Abd CTscvn  2016 09/09/2019   Late effect of cerebrovascular accident (CVA) 03/28/2019   CKD stage 2 due to type 2 diabetes mellitus ([place]) 12/20/2017   Chronic anticoagulation 09/04/2017   Ataxia, post-stroke    History of CVA with residual deficit    Hypothyroidism 09/13/2016   Vitamin D deficiency 04/29/2014    Medication management 01/03/2014   Type 2 diabetes mellitus (HCC)    BPH (benign prostatic hyperplasia)    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   Essential hypertension 11/07/2013   Hyperlipidemia associated with type 2 diabetes mellitus ([place]) 11/07/2013   Paroxysmal atrial fibrillation ([place]) 09/18/2012    Past Surgical History:  Procedure Laterality Date   CIRCUMCISION N/A 06/11/2019   Procedure: CIRCUMCISION ADULT;  Surgeon: [name], MD;  Location: [facility] ORS;  Service: Urology;  Laterality: N/A;   COLONOSCOPY     INGUINAL HERNIA REPAIR Bilateral    1968 and 2004   LOOP RECORDER INSERTION N/A 07/28/2017   Procedure: LOOP RECORDER INSERTION;  Surgeon: Deboraha Sprang, MD;  Location: [facility] LAB;  Service: Cardiovascular;  Laterality: N/A;   TEE WITHOUT CARDIOVERSION N/A 07/28/2017   Procedure: TRANSESOPHAGEAL ECHOCARDIOGRAM (TEE);  Surgeon: Sueanne Margarita, MD;  Location: [facility] ENDOSCOPY;  Service: Cardiovascular;  Laterality: N/A;       Family History  Problem Relation Age of Onset   Sickle cell anemia Mother    Diabetes Father    Stomach cancer Father    Colon cancer Father    CAD Neg Hx    Esophageal cancer Neg Hx    Rectal cancer Neg Hx     Social History   Tobacco Use   Smoking status: Never  Smokeless tobacco: Never  Vaping Use   Vaping Use: Never used  Substance Use Topics   Alcohol use: No   Drug use: No    Home Medications Prior to Admission medications   Medication Sig Start Date End Date Taking? Authorizing Provider  atorvastatin (LIPITOR) 40 MG tablet TAKE 1 TABLET BY MOUTH  DAILY FOR CHOLESTEROL 04/07/21  Yes Liane Comber, NP  Cholecalciferol (VITAMIN D PO) Take 1 tablet by mouth daily.    Yes [provider]  Cyanocobalamin (B-12) 50 MCG TABS Take 50 mcg by mouth daily.   Yes [provider]  DULoxetine (CYMBALTA) 20 MG capsule Take 1 cap daily for mood and pain. Patient taking differently: Take  20 mg by mouth daily. 07/07/21  Yes Corbett, Caryl Pina, NP  ELIQUIS 5 MG TABS tablet TAKE 1 TABLET BY MOUTH  TWICE DAILY TO PREVENT  BLOOD CLOTS 01/25/21  Yes Liane Comber, NP  finasteride (PROSCAR) 5 MG tablet TAKE 1 TABLET BY MOUTH  DAILY FOR PROSTATE 09/30/20  Yes Liane Comber, NP  furosemide (LASIX) 40 MG tablet Take 40 mg by mouth daily.   Yes [provider]  gabapentin (NEURONTIN) 300 MG capsule Take  1 to 2 capsules  1 hour before Bedtime  for Diabetic Neuropathy pains /Patient knows to take by mouth Patient taking differently: Take 600 mg by mouth at bedtime. 06/18/21  Yes [name], MD  glipiZIDE (GLUCOTROL) 5 MG tablet Take 1/2 tablet  2 x /day  with Meals  for Diabetes Patient taking differently: Take 2.5 mg by mouth 2 (two) times daily before a meal. 07/07/21  Yes Liane Comber, NP  levothyroxine (SYNTHROID) 50 MCG tablet TAKE WHOLE TAB SUN/TUES/THURS, TAKE 1/2 TAB ALL OTHER DAYS. TAKE IN THE MORNING ON AN EMPTY STOMACH WITH ONLY WATER 30 MINUTES BEFORE BREAKFAST AND NO ANTACID MEDS, CALCIUM OR MAGNESIUM FOR 4 HOURS AND AVOID BIOTIN 05/17/21  Yes Liane Comber, NP  Magnesium 250 MG TABS Take 250 mg by mouth daily.    Yes [provider]  metFORMIN (GLUCOPHAGE-XR) 500 MG 24 hr tablet TAKE 2 TABLETS BY MOUTH 2  TIMES DAILY WITH MEALS FOR  DIABETES 09/30/20  Yes Liane Comber, NP  Multiple Vitamin (MULTIVITAMIN) capsule Take 1 capsule by mouth daily.   Yes [provider]  trolamine salicylate (ASPERCREME) 10 % cream Apply 1 application topically daily as needed for muscle pain.   Yes [provider]  vitamin C (ASCORBIC ACID) 500 MG tablet Take 500 mg by mouth daily.   Yes [provider]  Blood Glucose Monitoring Suppl (ONE TOUCH ULTRA 2) w/Device KIT Check blood sugar 1 time a day-DX-E11.9 09/09/19   [name], MD  furosemide (LASIX) 40 MG tablet Take 1 tablet (40 mg total) by mouth daily. 12/28/20 03/28/21  Baldwin Jamaica, PA-C   glucose blood (ONETOUCH ULTRA) test strip CHECK BLOOD SUGAR ONCE A DAY. 10/13/20   [name], MD  Lancets [facility] DELICA PLUS [id number]) [place] BLOOD SUGAR ONCE A DAY. 02/01/21   Liane Comber, NP  Lancets Misc. (ONE TOUCH SURESOFT) MISC Check blood sugar 1 time a day-DX-E11.91 09/09/19   [name], MD  sodium chloride (OCEAN) 0.65 % nasal spray Place 2 sprays into the nose as needed for congestion. 03/09/20 03/09/21  Vladimir Crofts, PA-C  triamcinolone ointment (KENALOG) 0.1 % Apply topically 2 (two) times daily. Patient not taking: Reported on 07/23/2021 10/19/20   Liane Comber, NP    Allergies    Penicillins  Review of Systems   Review of Systems  Constitutional:  Negative for fever.  HENT:  Negative for sore throat.   Eyes:  Negative for visual disturbance.  Respiratory:  Positive for shortness of breath (on exertion). Negative for cough.   Cardiovascular:  Positive for leg swelling. Negative for chest pain.  Gastrointestinal:  Negative for abdominal pain, nausea and vomiting.  Genitourinary:  Negative for difficulty urinating.  Musculoskeletal:  Negative for back pain and neck stiffness.  Skin:  Negative for rash.  Neurological:  Negative for syncope and headaches.   Physical Exam Updated Vital Signs BP 114/83   Pulse 86   Temp 98.7 F (37.1 C) (Oral)   Resp 18   Ht $R'5\' 11"'dq$  (1.803 m)   Wt 83 kg   SpO2 99%   BMI 25.52 kg/m   Physical Exam Vitals and nursing note reviewed.  Constitutional:      General: He is not in acute distress.    Appearance: He is well-developed. He is not diaphoretic.  HENT:     Head: Normocephalic and atraumatic.  Eyes:     Conjunctiva/sclera: Conjunctivae normal.  Cardiovascular:     Rate and Rhythm: Normal rate and regular rhythm.     Heart sounds: Normal heart sounds. No murmur heard.   No friction rub. No gallop.  Pulmonary:     Effort: Pulmonary effort is normal. No respiratory distress.     Breath sounds:  Normal breath sounds. No wheezing or rales.  Abdominal:     General: There is no distension.     Palpations: Abdomen is soft.     Tenderness: There is no abdominal tenderness. There is no guarding.  Musculoskeletal:     Cervical back: Normal range of motion.     Right lower leg: Edema present.     Left lower leg: Edema present.  Skin:    General: Skin is warm and dry.  Neurological:     Mental Status: He is alert and oriented to person, place, and time.    ED Results / Procedures / Treatments   Labs (all labs ordered are listed, but only abnormal results are displayed) Labs Reviewed  CBC WITH DIFFERENTIAL/PLATELET - Abnormal; Notable for the following components:      Result Value   RBC 4.06 (*)    Hemoglobin 12.1 (*)    HCT 36.1 (*)    All other components within normal limits  TROPONIN I (HIGH SENSITIVITY) - Abnormal; Notable for the following components:   Troponin I (High Sensitivity) 53 (*)    All other components within normal limits  TROPONIN I (HIGH SENSITIVITY) - Abnormal; Notable for the following components:   Troponin I (High Sensitivity) 56 (*)    All other components within normal limits    EKG EKG Interpretation  Date/Time:  Friday July 23 2021 20:17:18 EDT Ventricular Rate:  83 PR Interval:  195 QRS Duration: 96 QT Interval:  415 QTC Calculation: 488 R Axis:     Text Interpretation: No significant change since last tracing Confirmed by Gareth Morgan [id number]) on 07/23/2021 8:49:19 PM  Radiology DG Chest 2 View  Result Date: 07/23/2021 CLINICAL DATA:  Shortness of breath. Dyspnea on exertion for 4 weeks. EXAM: CHEST - 2 VIEW COMPARISON:  Radiographs 01/14/2020. FINDINGS: The heart remains enlarged. The previously demonstrated pleural effusions and bibasilar atelectasis have resolved. There is no edema or pneumothorax. There are stable degenerative changes in the spine. IMPRESSION: Cardiomegaly without pulmonary edema or other acute process.  Electronically Signed   By: Richardean Sale M.D.   On: 07/23/2021 19:46    Procedures Procedures   Medications Ordered in ED Medications  furosemide (LASIX) injection 40 mg (40 mg Intravenous Given 07/23/21 2121)    ED Course  I have reviewed the triage vital signs and the nursing notes.  Pertinent labs & imaging results that were available during my care of the patient were reviewed by me and considered in my medical decision making (see chart for details).    MDM Rules/Calculators/A&P                           80 year old male with history of paroxysmal atrial fibrillation on Eliquis, MGUS, hypertension, hyperlipidemia, diabetes type 2, BPH, hypothyroidism, history of CVA, ataxia, CKD, acquired normokalemia, abdominal aortic atherosclerosis who presents with concern for bilateral lower extremity edema and dyspnea on exertion.  He was sent by primary care doctor when his BNP was found to be greater than 700.  History and physical exam are consistent with congestive heart failure.  Chest x-ray shows cardiomegaly and vascular congestion without pulmonary edema.  His vital signs are normal, with no hypoxia, no tachypnea, no dyspnea at rest.  EKG without significant abnormalities.  Able to view labs obtained as an outpatient today which included a stable creatinine of 1.12, K of 4.9, and BNP of 762.  CBC was obtained which showed no significant anemia.  Troponin is elevated to 53, with no prior for comparison.  He denies any chest pain or pressure, acute onset of dyspnea or nausea, jaw pain, arm pain, neck pain or other symptoms to suggest ACS.  Troponin elevation is likely secondary to congestive heart failure.  Repeat troponin 56, also not consistent with ACS.  Mild elevation likely related to heart failure.   Given 40 mg of IV Lasix in the emergency department with good urine output.   Discussed possibility of admission given his elevated BNP, troponin, and trial of Lasix, however he prefers  outpatient follow-up.  Given his hemodynamic stability, feels reasonable to give IV Lasix in the emergency department and continue increased dose with close follow-up with cardiology and PCP.  He sees Dr. Caryl Comes cardiology as an outpatient.  Patient and daughter report he takes $Remove'20mg'pVnzXvq$  of lasix daily (although outpt notes appear to say $Remo'40mg'ADjPU$ )---discussed increasing lasix to $Remove'40mg'tcUQxvH$  daily for 3 days.  Patient discharged in stable condition with understanding of reasons to return.     Final Clinical Impression(s) / ED Diagnoses Final diagnoses:  Acute on chronic congestive heart failure, unspecified heart failure type [facility])    Rx / DC Orders ED Discharge Orders     None        Gareth Morgan, MD 07/23/21 2347

## 2021-07-24 ENCOUNTER — Encounter: Payer: Self-pay | Admitting: Nurse Practitioner

## 2021-07-24 DIAGNOSIS — I509 Heart failure, unspecified: Secondary | ICD-10-CM | POA: Insufficient documentation

## 2021-07-27 ENCOUNTER — Ambulatory Visit (INDEPENDENT_AMBULATORY_CARE_PROVIDER_SITE_OTHER): Payer: Medicare Other | Admitting: Cardiology

## 2021-07-27 ENCOUNTER — Ambulatory Visit: Payer: Medicare Other | Admitting: Nurse Practitioner

## 2021-07-27 ENCOUNTER — Telehealth: Payer: Self-pay | Admitting: Internal Medicine

## 2021-07-27 ENCOUNTER — Encounter: Payer: Self-pay | Admitting: Cardiology

## 2021-07-27 ENCOUNTER — Other Ambulatory Visit: Payer: Self-pay

## 2021-07-27 VITALS — BP 118/70 | HR 90 | Ht 71.0 in | Wt 179.4 lb

## 2021-07-27 DIAGNOSIS — I10 Essential (primary) hypertension: Secondary | ICD-10-CM | POA: Diagnosis not present

## 2021-07-27 DIAGNOSIS — I480 Paroxysmal atrial fibrillation: Secondary | ICD-10-CM

## 2021-07-27 DIAGNOSIS — R609 Edema, unspecified: Secondary | ICD-10-CM

## 2021-07-27 DIAGNOSIS — R600 Localized edema: Secondary | ICD-10-CM | POA: Diagnosis not present

## 2021-07-27 DIAGNOSIS — R0600 Dyspnea, unspecified: Secondary | ICD-10-CM

## 2021-07-27 DIAGNOSIS — R778 Other specified abnormalities of plasma proteins: Secondary | ICD-10-CM

## 2021-07-27 DIAGNOSIS — R0602 Shortness of breath: Secondary | ICD-10-CM

## 2021-07-27 MED ORDER — FUROSEMIDE 40 MG PO TABS: 40.0000 mg | ORAL_TABLET | Freq: Every day | ORAL | 3 refills | Status: DC

## 2021-07-27 NOTE — Progress Notes (Signed)
Cardiology Office Note:    Date:  07/27/2021   ID:  Earl [name], DOB 05-Jul-1941, MRN [id number]  PCP:  [name], MD  Cardiologist:  [name], MD    Referring MD: [name], MD   Chief Complaint  Patient presents with   Congestive Heart Failure   Atrial Fibrillation   Hypertension    History of Present Illness:    Earl [name] is a 80 y.o. male with a hx of  DM, HTN, HLD, diastolic dysfunction, cryptogenic stroke > loop > Afib.  He is here today for evaluation of LE edema.  He says that he has been salting his food frequently recently and noticed for the past 6 weeks that he has had LE edema.  He also was having DOE.  He increased his Lasix from 14m daily to 480mdaily for 10 days and then stopped for 2 days and then went back on 4070mor 3 days but due to persistent DOE he was seen in the ER.  hsTrop 53>56, BNP 762.  Cxray showed no edema.  It was felt that his hsTrop was related to acute CHF.  He was given IV Lasix 78m58m ER and was discharged on Lasix 78mg82mly for 3 days and then decrease down to 20mg 52my.  His daughter is with him today and he has significantly changed the way he eats and the LE edema is much improved.  He says that his SOB has improved but he is still SOB.  He denies any chest pain or pressure, dizziness, syncope, PND or orthopnea.   Past Medical History:  Diagnosis Date   Adenomatous colon polyp    Allergy    Anal fissure    Arthritis    BPH (benign prostatic hyperplasia)    Diabetes mellitus (HCC)  [place] (gastroesophageal reflux disease)    Hyperlipidemia    Hypertension    Hypothyroidism    Other testicular hypofunction    [facility]:  Procedure Laterality Date   CIRCUMCISION N/A 06/11/2019   Procedure: CIRCUMCISION ADULT;  Surgeon: EskridFestus [name] Location: [facility] ORS;  Service: Urology;  Laterality: N/A;   COLONOSCOPY     INGUINAL HERNIA REPAIR Bilateral    1968 and 2004   LOOP  RECORDER INSERTION N/A 07/28/2017   Procedure: LOOP RECORDER INSERTION;  Surgeon: Klein,Deboraha Sprang [facility];  Service: Cardiovascular;  Laterality: N/A;   TEE WITHOUT CARDIOVERSION N/A 07/28/2017   Procedure: TRANSESOPHAGEAL ECHOCARDIOGRAM (TEE);  Surgeon: TurnerSueanne Margarita [facility];  Service: Cardiovascular;  Laterality: N/A;    Current Medications: Current Meds  Medication Sig   atorvastatin (LIPITOR) 40 MG tablet TAKE 1 TABLET BY MOUTH  DAILY FOR CHOLESTEROL   Blood Glucose Monitoring Suppl (ONE TOUCH ULTRA 2) w/Device KIT Check blood sugar 1 time a day-DX-E11.9   Cholecalciferol (VITAMIN D PO) Take 1 tablet by mouth daily.    Cyanocobalamin (B-12) 50 MCG TABS Take 50 mcg by mouth daily.   DULoxetine (CYMBALTA) 20 MG capsule Take 1 cap daily for mood and pain.   ELIQUIS 5 MG TABS tablet TAKE 1 TABLET BY MOUTH  TWICE DAILY TO PREVENT  BLOOD CLOTS   finasteride (PROSCAR) 5 MG tablet TAKE 1 TABLET BY MOUTH  DAILY FOR PROSTATE   furosemide (LASIX) 40 MG tablet Take 1 tablet (40 mg total) by mouth daily.   gabapentin (NEURONTIN) 300 MG capsule Take  1 to 2 capsules  1 hour before Bedtime  for Diabetic Neuropathy pains /Patient knows to take by mouth (Patient taking differently: Take  1 to 2 capsules  1 hour before Bedtime  for Diabetic Neuropathy pains /Patient knows to take by mouth)   glipiZIDE (GLUCOTROL) 5 MG tablet Take 1/2 tablet  2 x /day  with Meals  for Diabetes (Patient taking differently: Take 1/2 tablet  2 x /day  with Meals  for Diabetes)   glucose blood (ONETOUCH ULTRA) test strip CHECK BLOOD SUGAR ONCE A DAY.   Lancets (ONETOUCH DELICA PLUS [id number]) MISC CHECK BLOOD SUGAR ONCE A DAY.   Lancets Misc. (ONE TOUCH SURESOFT) MISC Check blood sugar 1 time a day-DX-E11.91   levothyroxine (SYNTHROID) 50 MCG tablet TAKE WHOLE TAB SUN/TUES/THURS, TAKE 1/2 TAB ALL OTHER DAYS. TAKE IN THE MORNING ON AN EMPTY STOMACH WITH ONLY WATER 30 MINUTES BEFORE  BREAKFAST AND NO ANTACID MEDS, CALCIUM OR MAGNESIUM FOR 4 HOURS AND AVOID BIOTIN   Magnesium 250 MG TABS Take 250 mg by mouth daily.    metFORMIN (GLUCOPHAGE-XR) 500 MG 24 hr tablet TAKE 2 TABLETS BY MOUTH 2  TIMES DAILY WITH MEALS FOR  DIABETES   Multiple Vitamin (MULTIVITAMIN) capsule Take 1 capsule by mouth daily.   triamcinolone ointment (KENALOG) 0.1 % Apply topically 2 (two) times daily.   trolamine salicylate (ASPERCREME) 10 % cream Apply 1 application topically daily as needed for muscle pain.   vitamin C (ASCORBIC ACID) 500 MG tablet Take 500 mg by mouth daily.   [DISCONTINUED] furosemide (LASIX) 20 MG tablet Take 20 mg by mouth daily.     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[employer], occupational: RETIRED  Tobacco Use   Smoking status: Never   Smokeless tobacco: Never  Vaping Use   Vaping Use: Never used  Substance and Sexual Activity   Alcohol use: No   Drug use: No   Sexual activity: Not on file  Other Topics Concern   Not on file  Social History Narrative   Not on file   Social Determinants of Health   Financial Resource Strain: Not on file  Food Insecurity: Not on file  Transportation Needs: Not on file  Physical Activity: Not on file  Stress: Not on file  Social Connections: Not on file     Family History: The patient's family history includes Colon cancer in his father; Diabetes in his father; Sickle cell anemia in his mother; Stomach cancer in his father. There is no history of CAD, Esophageal cancer, or Rectal cancer.  ROS:   Please see the history of present illness.    ROS  All other systems reviewed and negative.   EKGs/Labs/Other Studies Reviewed:    The following studies were reviewed  today: none  EKG:  EKG is  ordered today.  The ekg ordered today demonstrates NSR with anterior infarct and nonspecific T wave abnormality  Recent Labs: 05/17/2021: Magnesium 1.9; TSH 0.58 07/07/2021: ALT 50 07/23/2021: Brain Natriuretic Peptide 762; BUN 23; Creat 1.12; Hemoglobin 12.1; Platelets 186; Potassium 4.9; Sodium 138   Recent Lipid Panel     Physical Exam:    VS:  BP 118/70  Pulse 90   Ht _0  (1.803 m)   Wt 179 lb 6.4 oz (81.4 kg)   SpO2 99%   BMI 25.02 kg/m     Wt Readings from Last 3 Encounters:  07/27/21 179 lb 6.4 oz (81.4 kg)  07/23/21 183 lb (83 kg)  07/23/21 183 lb (83 kg)     GEN:  Well nourished, well developed in no acute distress HEENT: Normal NECK: No JVD; No carotid bruits LYMPHATICS: No lymphadenopathy CARDIAC: RRR, no murmurs, rubs, gallops RESPIRATORY:  Clear to auscultation without rales, wheezing or rhonchi  ABDOMEN: Soft, non-tender, non-distended MUSCULOSKELETAL:  1+ Bilateral LE  edema; No deformity  SKIN: Warm and dry NEUROLOGIC:  Alert and oriented x 3 PSYCHIATRIC:  Normal affect   ASSESSMENT:    1. Leg edema   2. Shortness of breath   3. Elevated troponin   4. Essential hypertension   5. Paroxysmal atrial fibrillation (HCC)    PLAN:    In order of problems listed above:   LE edema -likely related to dietary indiscretion with Na intake and noncompliance with daily Lasix -he still has LE edema but improved according to him with changing to a low Na diet as well as taking lasix dailiy -continue compression hose -encouraged to follow strict < 2gm Na diet daily -increase Lasix to 59m daily -check BMET in 1 week -check 2D echo to assess LVF  2.  SOB - likely related to acute diastolic CHF in setting of dietary non compliance with Na - he does have CRFs for CAD and had an slightly elevated hsTrop at recent ER visit for CHF - will get a Lexiscan myoview to rule out ischemia - Shared Decision Making/Informed Consent The  risks [chest pain, shortness of breath, cardiac arrhythmias, dizziness, blood pressure fluctuations, myocardial infarction, stroke/transient ischemic attack, nausea, vomiting, allergic reaction, radiation exposure, metallic taste sensation and life-threatening complications (estimated to be 1 in 10,000)], benefits (risk stratification, diagnosing coronary artery disease, treatment guidance) and alternatives of a nuclear stress test were discussed in detail with Mr. TMcmichaeland he agrees to proceed.  3.  Elevated hsTrop -he denies any chest pain or pressure -likely related to volume overload -will check 2D echo to assess LVF -Lexiscan myoview to rule out ischemia given ongoing SOB despite euvolemia on exam today  4.  HTN -Bp controlled on exam today -currently diet controlled  5.  PAF -maintaining NSR on exam and on EKG a few days ago in ER -Hbg was 12.1 and SCr 1.12 on 07/23/21 -Continue prescription drug management with Eliquis 589mBID with PRN refills  -followed by EP  Followup with Dr. KlCaryl Comesn 1-2 weeks   Time Spent: 25 minutes total time of encounter, including 15 minutes spent in face-to-face patient care on the date of this encounter. This time includes coordination of care and counseling regarding above mentioned problem list. Remainder of non-face-to-face time involved reviewing chart documents/testing relevant to the patient encounter and documentation in the medical record. I have independently reviewed documentation from referring provider  Medication Adjustments/Labs and Tests Ordered: Current medicines are reviewed at length with the patient today.  Concerns regarding medicines are outlined above.  Orders Placed This Encounter  Procedures   Basic metabolic panel   Cardiac Stress Test: Informed Consent Details: Physician/Practitioner Attestation; Transcribe to consent form and obtain patient signature   MYOCARDIAL PERFUSION IMAGING   ECHOCARDIOGRAM COMPLETE    Meds  ordered this encounter  Medications   furosemide (LASIX) 40 MG tablet  Sig: Take 1 tablet (40 mg total) by mouth daily.    Dispense:  90 tablet    Refill:  3     Signed, Fransico Him, MD  07/27/2021 12:56 PM    [facility]

## 2021-07-27 NOTE — Telephone Encounter (Addendum)
Pt is scheduled to see Dr Fransico Him 07/27/2021 follow up ED visit for CHF.

## 2021-07-27 NOTE — Telephone Encounter (Signed)
Spoke with Marta Lamas, NP with [facility].  She reports that pt was sent to the ED on 07/23/21 for DOE and peripheral edema.  BNP elevated 700 ED notes r/t CHF.  Pt refused to be admitted would rather f/u outpatient.  Pt placed on DOD schedule for 11:30 am.  NP staff will call pt with appointment details.

## 2021-07-27 NOTE — Patient Instructions (Signed)
Medication Instructions:  1) INCREASE Lasix (furosemide) 40 mg daily  *If you need a refill on your cardiac medications before your next appointment, please call your pharmacy*   Lab Work: BMET in one week If you have labs (blood work) drawn today and your tests are completely normal, you will receive your results only by: [place] (if you have MyChart) OR A paper copy in the mail If you have any lab test that is abnormal or we need to change your treatment, we will call you to review the results.   Testing/Procedures: Your physician has requested that you have an echocardiogram. Echocardiography is a painless test that uses sound waves to create images of your heart. It provides your doctor with information about the size and shape of your heart and how well your heart's chambers and valves are working. This procedure takes approximately one hour. There are no restrictions for this procedure.  Your physician has requested that you have a lexiscan myoview. For further information please visit [email or web address]. Please follow instruction sheet, as given.  Follow-Up: At [facility], you and your health needs are our priority.  As part of our continuing mission to provide you with exceptional heart care, we have created designated Provider Care Teams.  These Care Teams include your primary Cardiologist (physician) and Advanced Practice Providers (APPs -  Physician Assistants and Nurse Practitioners) who all work together to provide you with the care you need, when you need it.   Your next appointment:   2 month(s)  The format for your next appointment:   In Person  Provider:   You may see [name], MD or one of the following Advanced Practice Providers on your designated Care Team:   [name], [place] "[facility]" [place], [place]

## 2021-07-27 NOTE — Telephone Encounter (Signed)
New message:    Hinton Dyer from [facility] calling requesting this patient get see ASAP

## 2021-07-28 ENCOUNTER — Other Ambulatory Visit: Payer: Self-pay | Admitting: Adult Health

## 2021-07-28 NOTE — Telephone Encounter (Signed)
Thanks DK

## 2021-07-29 DIAGNOSIS — E1122 Type 2 diabetes mellitus with diabetic chronic kidney disease: Secondary | ICD-10-CM | POA: Diagnosis not present

## 2021-07-29 DIAGNOSIS — I480 Paroxysmal atrial fibrillation: Secondary | ICD-10-CM | POA: Diagnosis not present

## 2021-07-29 DIAGNOSIS — E1141 Type 2 diabetes mellitus with diabetic mononeuropathy: Secondary | ICD-10-CM | POA: Diagnosis not present

## 2021-07-29 DIAGNOSIS — I69351 Hemiplegia and hemiparesis following cerebral infarction affecting right dominant side: Secondary | ICD-10-CM | POA: Diagnosis not present

## 2021-07-29 DIAGNOSIS — N182 Chronic kidney disease, stage 2 (mild): Secondary | ICD-10-CM | POA: Diagnosis not present

## 2021-07-29 DIAGNOSIS — I69393 Ataxia following cerebral infarction: Secondary | ICD-10-CM | POA: Diagnosis not present

## 2021-08-03 ENCOUNTER — Other Ambulatory Visit: Payer: Medicare Other | Admitting: *Deleted

## 2021-08-03 ENCOUNTER — Other Ambulatory Visit: Payer: Self-pay

## 2021-08-03 DIAGNOSIS — R0602 Shortness of breath: Secondary | ICD-10-CM

## 2021-08-03 DIAGNOSIS — I480 Paroxysmal atrial fibrillation: Secondary | ICD-10-CM

## 2021-08-04 ENCOUNTER — Telehealth: Payer: Self-pay | Admitting: Internal Medicine

## 2021-08-04 DIAGNOSIS — I480 Paroxysmal atrial fibrillation: Secondary | ICD-10-CM | POA: Diagnosis not present

## 2021-08-04 DIAGNOSIS — I69393 Ataxia following cerebral infarction: Secondary | ICD-10-CM | POA: Diagnosis not present

## 2021-08-04 DIAGNOSIS — I10 Essential (primary) hypertension: Secondary | ICD-10-CM

## 2021-08-04 DIAGNOSIS — R600 Localized edema: Secondary | ICD-10-CM

## 2021-08-04 DIAGNOSIS — E1122 Type 2 diabetes mellitus with diabetic chronic kidney disease: Secondary | ICD-10-CM | POA: Diagnosis not present

## 2021-08-04 DIAGNOSIS — N182 Chronic kidney disease, stage 2 (mild): Secondary | ICD-10-CM | POA: Diagnosis not present

## 2021-08-04 DIAGNOSIS — E1141 Type 2 diabetes mellitus with diabetic mononeuropathy: Secondary | ICD-10-CM | POA: Diagnosis not present

## 2021-08-04 DIAGNOSIS — I69351 Hemiplegia and hemiparesis following cerebral infarction affecting right dominant side: Secondary | ICD-10-CM | POA: Diagnosis not present

## 2021-08-04 LAB — BASIC METABOLIC PANEL
BUN/Creatinine Ratio: 19 (ref 10–24)
BUN: 24 mg/dL (ref 8–27)
CO2: 23 mmol/L (ref 20–29)
Calcium: 9.5 mg/dL (ref 8.6–10.2)
Chloride: 102 mmol/L (ref 96–106)
Creatinine, Ser: 1.24 mg/dL (ref 0.76–1.27)
Glucose: 112 mg/dL — ABNORMAL HIGH (ref 65–99)
Potassium: 5.3 mmol/L — ABNORMAL HIGH (ref 3.5–5.2)
Sodium: 139 mmol/L (ref 134–144)
eGFR: 59 mL/min/{1.73_m2} — ABNORMAL LOW (ref >59)

## 2021-08-04 NOTE — Telephone Encounter (Signed)
I called the pt back and he reports that he is not taking anything that could be elevating his K.. he tries to hydrate well with sugar free tea, water, and orange juice... he eats snacks that his daughter gives him but nothing outside of cookies, pudding and crackers. No OTC supplements.   Will forward back to Dr. Heron Nay for her review.    Sueanne Margarita, MD  08/04/2021  3:03 PM EDT     SCr slightly increased but still stable from prior labs.  K+ elevated please find out if he is taking any potassium supp even OTC suppl

## 2021-08-04 NOTE — Telephone Encounter (Signed)
Pt is returning call from earlier today. Pt said he is unsure of who called him. Please advise

## 2021-08-04 NOTE — Telephone Encounter (Signed)
Spoke with the patient and advised him to cut out OJ. Repeat labs have been scheduled.

## 2021-08-11 DIAGNOSIS — E1141 Type 2 diabetes mellitus with diabetic mononeuropathy: Secondary | ICD-10-CM | POA: Diagnosis not present

## 2021-08-11 DIAGNOSIS — N182 Chronic kidney disease, stage 2 (mild): Secondary | ICD-10-CM | POA: Diagnosis not present

## 2021-08-11 DIAGNOSIS — E1122 Type 2 diabetes mellitus with diabetic chronic kidney disease: Secondary | ICD-10-CM | POA: Diagnosis not present

## 2021-08-11 DIAGNOSIS — I69351 Hemiplegia and hemiparesis following cerebral infarction affecting right dominant side: Secondary | ICD-10-CM | POA: Diagnosis not present

## 2021-08-11 DIAGNOSIS — I480 Paroxysmal atrial fibrillation: Secondary | ICD-10-CM | POA: Diagnosis not present

## 2021-08-11 DIAGNOSIS — I69393 Ataxia following cerebral infarction: Secondary | ICD-10-CM | POA: Diagnosis not present

## 2021-08-12 ENCOUNTER — Telehealth (HOSPITAL_COMMUNITY): Payer: Self-pay

## 2021-08-12 DIAGNOSIS — I69351 Hemiplegia and hemiparesis following cerebral infarction affecting right dominant side: Secondary | ICD-10-CM | POA: Diagnosis not present

## 2021-08-12 DIAGNOSIS — I480 Paroxysmal atrial fibrillation: Secondary | ICD-10-CM | POA: Diagnosis not present

## 2021-08-12 DIAGNOSIS — E1122 Type 2 diabetes mellitus with diabetic chronic kidney disease: Secondary | ICD-10-CM | POA: Diagnosis not present

## 2021-08-12 DIAGNOSIS — N182 Chronic kidney disease, stage 2 (mild): Secondary | ICD-10-CM | POA: Diagnosis not present

## 2021-08-12 DIAGNOSIS — E1141 Type 2 diabetes mellitus with diabetic mononeuropathy: Secondary | ICD-10-CM | POA: Diagnosis not present

## 2021-08-12 DIAGNOSIS — I69393 Ataxia following cerebral infarction: Secondary | ICD-10-CM | POA: Diagnosis not present

## 2021-08-12 NOTE — Telephone Encounter (Signed)
Detailed instructions left on the patient's answering machine. Asked to call back with any questions. S.Burnette Valenti EMTP 

## 2021-08-17 ENCOUNTER — Other Ambulatory Visit: Payer: Self-pay

## 2021-08-17 ENCOUNTER — Ambulatory Visit (HOSPITAL_COMMUNITY): Payer: Medicare Other | Attending: Cardiology

## 2021-08-17 ENCOUNTER — Encounter (HOSPITAL_COMMUNITY): Payer: Self-pay

## 2021-08-17 ENCOUNTER — Other Ambulatory Visit: Payer: Medicare Other

## 2021-08-17 ENCOUNTER — Ambulatory Visit (HOSPITAL_BASED_OUTPATIENT_CLINIC_OR_DEPARTMENT_OTHER): Payer: Medicare Other

## 2021-08-17 DIAGNOSIS — R600 Localized edema: Secondary | ICD-10-CM | POA: Diagnosis not present

## 2021-08-17 DIAGNOSIS — R0602 Shortness of breath: Secondary | ICD-10-CM | POA: Diagnosis not present

## 2021-08-17 DIAGNOSIS — I10 Essential (primary) hypertension: Secondary | ICD-10-CM

## 2021-08-17 DIAGNOSIS — I480 Paroxysmal atrial fibrillation: Secondary | ICD-10-CM

## 2021-08-17 LAB — BASIC METABOLIC PANEL
BUN/Creatinine Ratio: 21 (ref 10–24)
BUN: 27 mg/dL (ref 8–27)
CO2: 25 mmol/L (ref 20–29)
Calcium: 9.5 mg/dL (ref 8.6–10.2)
Chloride: 100 mmol/L (ref 96–106)
Creatinine, Ser: 1.26 mg/dL (ref 0.76–1.27)
Glucose: 119 mg/dL — ABNORMAL HIGH (ref 70–99)
Potassium: 4.5 mmol/L (ref 3.5–5.2)
Sodium: 140 mmol/L (ref 134–144)
eGFR: 58 mL/min/{1.73_m2} — ABNORMAL LOW (ref >59)

## 2021-08-17 LAB — MYOCARDIAL PERFUSION IMAGING
LV dias vol: 130 mL (ref 62–150)
LV sys vol: 179 mL
Nuc Stress EF: 27 %
Peak HR: 88 {beats}/min
Rest HR: 82 {beats}/min
Rest Nuclear Isotope Dose: 10.1 mCi
SDS: 0
SRS: 0
SSS: 0
ST Depression (mm): 0 mm
Stress Nuclear Isotope Dose: 31.2 mCi
TID: 0.99

## 2021-08-17 LAB — ECHOCARDIOGRAM COMPLETE
Area-P 1/2: 4.39 cm2
P 1/2 time: 300 msec
S' Lateral: 4.7 cm

## 2021-08-17 MED ORDER — TECHNETIUM TC 99M TETROFOSMIN IV KIT
29.8000 | PACK | Freq: Once | INTRAVENOUS | Status: AC | PRN
  Administered 2021-08-17: 29.8 via INTRAVENOUS
  Filled 2021-08-17: qty 30

## 2021-08-17 MED ORDER — REGADENOSON 0.4 MG/5ML IV SOLN
0.4000 mg | Freq: Once | INTRAVENOUS | Status: AC
  Administered 2021-08-17: 0.4 mg via INTRAVENOUS

## 2021-08-17 MED ORDER — TECHNETIUM TC 99M TETROFOSMIN IV KIT
10.1000 | PACK | Freq: Once | INTRAVENOUS | Status: AC | PRN
  Administered 2021-08-17: 10.1 via INTRAVENOUS
  Filled 2021-08-17: qty 11

## 2021-08-18 DIAGNOSIS — E1141 Type 2 diabetes mellitus with diabetic mononeuropathy: Secondary | ICD-10-CM | POA: Diagnosis not present

## 2021-08-18 DIAGNOSIS — I69351 Hemiplegia and hemiparesis following cerebral infarction affecting right dominant side: Secondary | ICD-10-CM | POA: Diagnosis not present

## 2021-08-18 DIAGNOSIS — E1122 Type 2 diabetes mellitus with diabetic chronic kidney disease: Secondary | ICD-10-CM | POA: Diagnosis not present

## 2021-08-18 DIAGNOSIS — I480 Paroxysmal atrial fibrillation: Secondary | ICD-10-CM | POA: Diagnosis not present

## 2021-08-18 DIAGNOSIS — I69393 Ataxia following cerebral infarction: Secondary | ICD-10-CM | POA: Diagnosis not present

## 2021-08-18 DIAGNOSIS — N182 Chronic kidney disease, stage 2 (mild): Secondary | ICD-10-CM | POA: Diagnosis not present

## 2021-08-18 NOTE — Progress Notes (Signed)
PCP:  [name], MD Primary Cardiologist: [name], MD Electrophysiologist: [name], MD   Earl [name] is a 80 y.o. male seen today for [name], MD for acute visit due to acute systolic CHF .    Echo 08/17/2021 showed newly depressed EF at 25-30% with moderate RV enlargement and "severely" elevated PASP.  Myoview 08/17/21 without ischemia, EF 27%  Since last being seen in our clinic the patient reports doing better. His SOB and edema have both improved on lasix. They were able to read the results on my chart but noone had reviewed them yet with patient. He denies CP, orthopnea, dizziness, lightheadedness, syncope or near syncope. Overall he is feeling better.   Past Medical History:  Diagnosis Date   Adenomatous colon polyp    Allergy    Anal fissure    Arthritis    BPH (benign prostatic hyperplasia)    Diabetes mellitus ([place])    GERD (gastroesophageal reflux disease)    Hyperlipidemia    Hypertension    Hypothyroidism    Other testicular hypofunction    Stroke [facility])    Past Surgical History:  Procedure Laterality Date   CIRCUMCISION N/A 06/11/2019   Procedure: CIRCUMCISION ADULT;  Surgeon: [name], MD;  Location: [facility] ORS;  Service: Urology;  Laterality: N/A;   COLONOSCOPY     INGUINAL HERNIA REPAIR Bilateral    1968 and 2004   LOOP RECORDER INSERTION N/A 07/28/2017   Procedure: LOOP RECORDER INSERTION;  Surgeon: Deboraha Sprang, MD;  Location: [place] [facility];  Service: Cardiovascular;  Laterality: N/A;   TEE WITHOUT CARDIOVERSION N/A 07/28/2017   Procedure: TRANSESOPHAGEAL ECHOCARDIOGRAM (TEE);  Surgeon: Sueanne Margarita, MD;  Location: [facility] ENDOSCOPY;  Service: Cardiovascular;  Laterality: N/A;    Current Outpatient Medications  Medication Sig Dispense Refill   atorvastatin (LIPITOR) 40 MG tablet TAKE 1 TABLET BY MOUTH  DAILY FOR CHOLESTEROL 90 tablet 3   Blood Glucose Monitoring Suppl (ONE TOUCH ULTRA 2) w/Device KIT Check blood sugar 1  time a day-DX-E11.9 1 kit 0   Cholecalciferol (VITAMIN D PO) Take 1 tablet by mouth daily.      Cyanocobalamin (B-12) 50 MCG TABS Take 50 mcg by mouth daily.     DULoxetine (CYMBALTA) 20 MG capsule Take 1 cap daily for mood and pain. 90 capsule 0   ELIQUIS 5 MG TABS tablet TAKE 1 TABLET BY MOUTH  TWICE DAILY TO PREVENT  BLOOD CLOTS 180 tablet 3   finasteride (PROSCAR) 5 MG tablet TAKE 1 TABLET BY MOUTH  DAILY FOR PROSTATE 90 tablet 3   furosemide (LASIX) 40 MG tablet Take 1 tablet (40 mg total) by mouth daily. 90 tablet 3   gabapentin (NEURONTIN) 300 MG capsule Take  1 to 2 capsules  1 hour before Bedtime  for Diabetic Neuropathy pains /Patient knows to take by mouth (Patient taking differently: Take  1 to 2 capsules  1 hour before Bedtime  for Diabetic Neuropathy pains /Patient knows to take by mouth) 180 capsule 3   glipiZIDE (GLUCOTROL) 5 MG tablet Take 1/2 tablet  2 x /day  with Meals  for Diabetes (Patient taking differently: Take 1/2 tablet  2 x /day  with Meals  for Diabetes) 90 tablet 3   glucose blood (ONETOUCH ULTRA) test strip CHECK BLOOD SUGAR ONCE A DAY. 100 strip 3   Lancets (ONETOUCH DELICA PLUS [id number]) MISC CHECK BLOOD SUGAR ONCE A DAY. 100 each 3   Lancets Misc. ([place])  MISC Check blood sugar 1 time a day-DX-E11.91 1 each 0   levothyroxine (SYNTHROID) 50 MCG tablet TAKE WHOLE TAB SUN/TUES/THURS, TAKE 1/2 TAB ALL OTHER DAYS. TAKE IN THE MORNING ON AN EMPTY STOMACH WITH ONLY WATER 30 MINUTES BEFORE BREAKFAST AND NO ANTACID MEDS, CALCIUM OR MAGNESIUM FOR 4 HOURS AND AVOID BIOTIN 90 tablet 3   Magnesium 250 MG TABS Take 250 mg by mouth daily.      metFORMIN (GLUCOPHAGE-XR) 500 MG 24 hr tablet TAKE 2 TABLETS BY MOUTH 2  TIMES DAILY WITH MEALS FOR  DIABETES 360 tablet 3   Multiple Vitamin (MULTIVITAMIN) capsule Take 1 capsule by mouth daily.     triamcinolone ointment (KENALOG) 0.1 % Apply topically 2 (two) times daily. Apply to rash 2 to 3 x /day 80 g 1   trolamine  salicylate (ASPERCREME) 10 % cream Apply 1 application topically daily as needed for muscle pain.     vitamin C (ASCORBIC ACID) 500 MG tablet Take 500 mg by mouth daily.     sodium chloride (OCEAN) 0.65 % nasal spray Place 2 sprays into the nose as needed for congestion. 30 mL 3   No current facility-administered medications for this visit.    Allergies  Allergen Reactions   Penicillins Other (See Comments)    Did it involve swelling of the face/tongue/throat, SOB, or low BP? Unknown Did it involve sudden or severe rash/hives, skin peeling, or any reaction on the inside of your mouth or nose? Unknown Did you need to seek medical attention at a hospital or doctor's office? No When did it last happen?    Over 76 Years Ago   If all above answers are "NO", may proceed with cephalosporin use.      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[employer], occupational: RETIRED  Tobacco Use   Smoking status: Never   Smokeless tobacco: Never  Vaping Use   Vaping Use: Never used  Substance and Sexual Activity   Alcohol use: No   Drug use: No   Sexual activity: Not on file  Other Topics Concern   Not on file  Social History Narrative   Not on file   Social Determinants of Health   Financial Resource Strain: Not on file  Food Insecurity: Not on file  Transportation Needs: Not on file  Physical Activity: Not on file  Stress: Not on file  Social Connections: Not on file  Intimate Partner Violence: Not on file     Review of Systems: All other systems reviewed and are otherwise negative except as noted above.  Physical Exam: Vitals:   08/19/21 1213  BP: 128/76  Pulse: 100  SpO2: 96%  Weight: 174 lb (78.9 kg)  Height: _0  (1.803 m)    GEN- The patient is well  appearing, alert and oriented x 3 today.   HEENT: normocephalic, atraumatic; sclera clear, conjunctiva pink; hearing intact; oropharynx clear; neck supple, no JVP Lymph- no cervical lymphadenopathy Lungs- Clear to ausculation bilaterally, normal work of breathing.  No wheezes, rales, rhonchi Heart- Regular rate and rhythm, no murmurs, rubs or gallops, PMI not laterally displaced GI- soft, non-tender, non-distended, bowel sounds present, no hepatosplenomegaly Extremities- no clubbing, cyanosis, or edema; DP/PT/radial pulses 2+ bilaterally  MS- no significant deformity or atrophy Skin- warm and dry, no rash or lesion Psych- euthymic mood, full affect Neuro- strength and sensation are intact  EKG is not ordered. Personal review of EKG from  07/27/21  shows NSR at 83 bpm with QRS 96 ms.  Additional studies reviewed include: Previous EP office notes.   Assessment and Plan:  1. 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 Acute systolic CHF New diagnosis by Echo 08/17/21 Myoview 08/17/21 with global dysfunction and no signs of ischemia. Apical thinning noted NYHA II-III symptoms Volume status stable on exam today. Continue lasix 40 mg daily Add toprol 25 mg qhs Add losartan 25 mg daily. If tolerates, consider Entresto. BMET 10-14 days.  Consider spiro Consider hydralazine/imdur Consider SGLT2i Not sure they will be inclined to pursue ICD if EF does not improve given his advanced age.  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 Pulmonary HTN    3. HTN Stable on current regimen  Adjust medications in setting of treating his HF.   Shirley Friar, PA-C  08/19/21 12:20 PM

## 2021-08-19 ENCOUNTER — Encounter: Payer: Self-pay | Admitting: Student

## 2021-08-19 ENCOUNTER — Other Ambulatory Visit: Payer: Self-pay

## 2021-08-19 ENCOUNTER — Ambulatory Visit (INDEPENDENT_AMBULATORY_CARE_PROVIDER_SITE_OTHER): Payer: Medicare Other | Admitting: Student

## 2021-08-19 VITALS — BP 128/76 | HR 100 | Ht 71.0 in | Wt 174.0 lb

## 2021-08-19 DIAGNOSIS — Z9889 Other specified postprocedural states: Secondary | ICD-10-CM | POA: Diagnosis not present

## 2021-08-19 DIAGNOSIS — R0602 Shortness of breath: Secondary | ICD-10-CM | POA: Diagnosis not present

## 2021-08-19 DIAGNOSIS — I480 Paroxysmal atrial fibrillation: Secondary | ICD-10-CM

## 2021-08-19 DIAGNOSIS — I69351 Hemiplegia and hemiparesis following cerebral infarction affecting right dominant side: Secondary | ICD-10-CM | POA: Diagnosis not present

## 2021-08-19 DIAGNOSIS — I69393 Ataxia following cerebral infarction: Secondary | ICD-10-CM | POA: Diagnosis not present

## 2021-08-19 DIAGNOSIS — I6930 Unspecified sequelae of cerebral infarction: Secondary | ICD-10-CM | POA: Diagnosis not present

## 2021-08-19 DIAGNOSIS — E1122 Type 2 diabetes mellitus with diabetic chronic kidney disease: Secondary | ICD-10-CM | POA: Diagnosis not present

## 2021-08-19 DIAGNOSIS — I10 Essential (primary) hypertension: Secondary | ICD-10-CM | POA: Diagnosis not present

## 2021-08-19 DIAGNOSIS — E1141 Type 2 diabetes mellitus with diabetic mononeuropathy: Secondary | ICD-10-CM | POA: Diagnosis not present

## 2021-08-19 DIAGNOSIS — I5043 Acute on chronic combined systolic (congestive) and diastolic (congestive) heart failure: Secondary | ICD-10-CM

## 2021-08-19 DIAGNOSIS — N182 Chronic kidney disease, stage 2 (mild): Secondary | ICD-10-CM | POA: Diagnosis not present

## 2021-08-19 MED ORDER — LOSARTAN POTASSIUM 25 MG PO TABS: 25.0000 mg | ORAL_TABLET | Freq: Every day | ORAL | 3 refills | Status: DC

## 2021-08-19 MED ORDER — METOPROLOL SUCCINATE ER 25 MG PO TB24: 25.0000 mg | ORAL_TABLET | Freq: Every day | ORAL | 3 refills | Status: DC

## 2021-08-19 NOTE — Patient Instructions (Signed)
Medication Instructions:  Your physician has recommended you make the following change in your medication:   START: Metoprolol Succinate 25mg  daily at bedtime START: Losartan 25mg  daily  *If you need a refill on your cardiac medications before your next appointment, please call your pharmacy*   Lab Work: BMET - 09/02/2021  If you have labs (blood work) drawn today and your tests are completely normal, you will receive your results only by: [place] (if you have MyChart) OR A paper copy in the mail If you have any lab test that is abnormal or we need to change your treatment, we will call you to review the results.    Follow-Up: At [facility], you and your health needs are our priority.  As part of our continuing mission to provide you with exceptional heart care, we have created designated Provider Care Teams.  These Care Teams include your primary Cardiologist (physician) and Advanced Practice Providers (APPs -  Physician Assistants and Nurse Practitioners) who all work together to provide you with the care you need, when you need it.   Your next appointment:   09/15/2021

## 2021-08-25 DIAGNOSIS — N182 Chronic kidney disease, stage 2 (mild): Secondary | ICD-10-CM | POA: Diagnosis not present

## 2021-08-25 DIAGNOSIS — I69351 Hemiplegia and hemiparesis following cerebral infarction affecting right dominant side: Secondary | ICD-10-CM | POA: Diagnosis not present

## 2021-08-25 DIAGNOSIS — E1122 Type 2 diabetes mellitus with diabetic chronic kidney disease: Secondary | ICD-10-CM | POA: Diagnosis not present

## 2021-08-25 DIAGNOSIS — I69393 Ataxia following cerebral infarction: Secondary | ICD-10-CM | POA: Diagnosis not present

## 2021-08-25 DIAGNOSIS — I480 Paroxysmal atrial fibrillation: Secondary | ICD-10-CM | POA: Diagnosis not present

## 2021-08-25 DIAGNOSIS — E1141 Type 2 diabetes mellitus with diabetic mononeuropathy: Secondary | ICD-10-CM | POA: Diagnosis not present

## 2021-08-26 DIAGNOSIS — I480 Paroxysmal atrial fibrillation: Secondary | ICD-10-CM | POA: Diagnosis not present

## 2021-08-26 DIAGNOSIS — I69351 Hemiplegia and hemiparesis following cerebral infarction affecting right dominant side: Secondary | ICD-10-CM | POA: Diagnosis not present

## 2021-08-26 DIAGNOSIS — I69393 Ataxia following cerebral infarction: Secondary | ICD-10-CM | POA: Diagnosis not present

## 2021-08-26 DIAGNOSIS — E1122 Type 2 diabetes mellitus with diabetic chronic kidney disease: Secondary | ICD-10-CM | POA: Diagnosis not present

## 2021-08-26 DIAGNOSIS — N182 Chronic kidney disease, stage 2 (mild): Secondary | ICD-10-CM | POA: Diagnosis not present

## 2021-08-26 DIAGNOSIS — E1141 Type 2 diabetes mellitus with diabetic mononeuropathy: Secondary | ICD-10-CM | POA: Diagnosis not present

## 2021-08-28 ENCOUNTER — Other Ambulatory Visit: Payer: Self-pay | Admitting: Physician Assistant

## 2021-09-01 ENCOUNTER — Other Ambulatory Visit: Payer: Self-pay

## 2021-09-01 ENCOUNTER — Other Ambulatory Visit: Payer: Medicare Other

## 2021-09-01 DIAGNOSIS — E1141 Type 2 diabetes mellitus with diabetic mononeuropathy: Secondary | ICD-10-CM | POA: Diagnosis not present

## 2021-09-01 DIAGNOSIS — I10 Essential (primary) hypertension: Secondary | ICD-10-CM | POA: Diagnosis not present

## 2021-09-01 DIAGNOSIS — E1122 Type 2 diabetes mellitus with diabetic chronic kidney disease: Secondary | ICD-10-CM | POA: Diagnosis not present

## 2021-09-01 DIAGNOSIS — R0602 Shortness of breath: Secondary | ICD-10-CM | POA: Diagnosis not present

## 2021-09-01 DIAGNOSIS — I69351 Hemiplegia and hemiparesis following cerebral infarction affecting right dominant side: Secondary | ICD-10-CM | POA: Diagnosis not present

## 2021-09-01 DIAGNOSIS — I480 Paroxysmal atrial fibrillation: Secondary | ICD-10-CM | POA: Diagnosis not present

## 2021-09-01 DIAGNOSIS — N182 Chronic kidney disease, stage 2 (mild): Secondary | ICD-10-CM | POA: Diagnosis not present

## 2021-09-01 DIAGNOSIS — I6930 Unspecified sequelae of cerebral infarction: Secondary | ICD-10-CM | POA: Diagnosis not present

## 2021-09-01 DIAGNOSIS — I69393 Ataxia following cerebral infarction: Secondary | ICD-10-CM | POA: Diagnosis not present

## 2021-09-01 DIAGNOSIS — Z9889 Other specified postprocedural states: Secondary | ICD-10-CM | POA: Diagnosis not present

## 2021-09-02 ENCOUNTER — Other Ambulatory Visit: Payer: Medicare Other

## 2021-09-02 DIAGNOSIS — I69393 Ataxia following cerebral infarction: Secondary | ICD-10-CM | POA: Diagnosis not present

## 2021-09-02 DIAGNOSIS — E1122 Type 2 diabetes mellitus with diabetic chronic kidney disease: Secondary | ICD-10-CM | POA: Diagnosis not present

## 2021-09-02 DIAGNOSIS — E1141 Type 2 diabetes mellitus with diabetic mononeuropathy: Secondary | ICD-10-CM | POA: Diagnosis not present

## 2021-09-02 DIAGNOSIS — I480 Paroxysmal atrial fibrillation: Secondary | ICD-10-CM | POA: Diagnosis not present

## 2021-09-02 DIAGNOSIS — I69351 Hemiplegia and hemiparesis following cerebral infarction affecting right dominant side: Secondary | ICD-10-CM | POA: Diagnosis not present

## 2021-09-02 DIAGNOSIS — N182 Chronic kidney disease, stage 2 (mild): Secondary | ICD-10-CM | POA: Diagnosis not present

## 2021-09-02 LAB — BASIC METABOLIC PANEL
BUN/Creatinine Ratio: 19 (ref 10–24)
BUN: 23 mg/dL (ref 8–27)
CO2: 24 mmol/L (ref 20–29)
Calcium: 10.5 mg/dL — ABNORMAL HIGH (ref 8.6–10.2)
Chloride: 98 mmol/L (ref 96–106)
Creatinine, Ser: 1.23 mg/dL (ref 0.76–1.27)
Glucose: 106 mg/dL — ABNORMAL HIGH (ref 70–99)
Potassium: 4.5 mmol/L (ref 3.5–5.2)
Sodium: 139 mmol/L (ref 134–144)
eGFR: 59 mL/min/{1.73_m2} — ABNORMAL LOW (ref >59)

## 2021-09-09 ENCOUNTER — Encounter: Payer: Self-pay | Admitting: Internal Medicine

## 2021-09-09 DIAGNOSIS — N182 Chronic kidney disease, stage 2 (mild): Secondary | ICD-10-CM | POA: Diagnosis not present

## 2021-09-09 DIAGNOSIS — E1141 Type 2 diabetes mellitus with diabetic mononeuropathy: Secondary | ICD-10-CM | POA: Diagnosis not present

## 2021-09-09 DIAGNOSIS — I69393 Ataxia following cerebral infarction: Secondary | ICD-10-CM | POA: Diagnosis not present

## 2021-09-09 DIAGNOSIS — E1122 Type 2 diabetes mellitus with diabetic chronic kidney disease: Secondary | ICD-10-CM | POA: Diagnosis not present

## 2021-09-09 DIAGNOSIS — I69351 Hemiplegia and hemiparesis following cerebral infarction affecting right dominant side: Secondary | ICD-10-CM | POA: Diagnosis not present

## 2021-09-09 DIAGNOSIS — I480 Paroxysmal atrial fibrillation: Secondary | ICD-10-CM | POA: Diagnosis not present

## 2021-09-09 NOTE — Patient Instructions (Signed)
Due to recent changes in healthcare laws, you may see the results of your imaging and laboratory studies on MyChart before your provider has had a chance to review them.  We understand that in some cases there may be results that are confusing or concerning to you. Not all laboratory results come back in the same time frame and the provider may be waiting for multiple results in order to interpret others.  Please give us 48 hours in order for your provider to thoroughly review all the results before contacting the office for clarification of your results.   +++++++++++++++++++++++++++++++  Vit D  & Vit C 1,000 mg   are recommended to help protect  against the Covid-19 and other Corona viruses.    Also it's recommended  to take  Zinc 50 mg  to help  protect against the Covid-19   and best place to get  is also on [employer]  and don't pay more than 6-8 cents /pill !  ================================ Coronavirus (COVID-19) Are you at risk?  Are you at risk for the Coronavirus (COVID-19)?  To be considered HIGH RISK for Coronavirus (COVID-19), you have to meet the following criteria:  Traveled to [place], [place], [place], [place] or [place]; or in the [place] to [place], [place], [place]  or [place]; and have fever, cough, and shortness of breath within the last 2 weeks of travel OR Been in close contact with a person diagnosed with COVID-19 within the last 2 weeks and have  fever, cough,and shortness of breath  IF YOU DO NOT MEET THESE CRITERIA, YOU ARE CONSIDERED LOW RISK FOR COVID-19.  What to do if you are HIGH RISK for COVID-19?  If you are having a medical emergency, call 911. Seek medical care right away. Before you go to a doctor's office, urgent care or emergency department,  call ahead and tell them about your recent travel, contact with someone diagnosed with COVID-19   and your symptoms.  You should receive instructions from your physician's office regarding  next steps of care.  When you arrive at healthcare provider, tell the healthcare staff immediately you have returned from  visiting [place], [place], [place], [place] or [place]; or traveled in the [place] to [place], [place],  [place] or [place] in the last two weeks or you have been in close contact with a person diagnosed with  COVID-19 in the last 2 weeks.   Tell the health care staff about your symptoms: fever, cough and shortness of breath. After you have been seen by a medical provider, you will be either: Tested for (COVID-19) and discharged home on quarantine except to seek medical care if  symptoms worsen, and asked to  Stay home and avoid contact with others until you get your results (4-5 days)  Avoid travel on public transportation if possible (such as bus, train, or airplane) or Sent to the Emergency Department by EMS for evaluation, COVID-19 testing  and  possible admission depending on your condition and test results.  What to do if you are LOW RISK for COVID-19?  Reduce your risk of any infection by using the same precautions used for avoiding the common cold or flu:  Wash your hands often with soap and warm water for at least 20 seconds.  If soap and water are not readily available,  use an alcohol-based hand sanitizer with at least 60% alcohol.  If coughing or sneezing, cover your mouth and nose by coughing   or sneezing into the elbow areas of your shirt or coat,  into a tissue or into your sleeve (not your hands). Avoid shaking hands with others and consider head nods or verbal greetings only. Avoid touching your eyes, nose, or mouth with unwashed hands.  Avoid close contact with people who are sick. Avoid places or events with large numbers of people in one location, like concerts or sporting events. Carefully consider travel plans you have or are making. If you are planning any travel outside or inside the [place], visit the CDC's Travelers' Health webpage for  the latest health notices. If you have some symptoms but not all symptoms, continue to monitor at home and seek medical attention  if your symptoms worsen. If you are having a medical emergency, call 911. >>>>>>>>>>>>>>>>>>>>>>>>>>>>>>>>>>>>>>>>>>>>>>>>>>> We Do NOT Approve of [employer] > > > > > > > > > > > > > > > > > > > > > > > > > > > > > > > > > > >  > >    Preventive Care for Adults  A healthy lifestyle and preventive care can promote health and wellness. Preventive health guidelines for men include the following key practices: A routine yearly physical is a good way to check with your health care provider about your health and preventative screening. It is a chance to share any concerns and updates on your health and to receive a thorough exam. Visit your dentist for a routine exam and preventative care every 6 months. Brush your teeth twice a day and floss once a day. Good oral hygiene prevents tooth decay and gum disease. The frequency of eye exams is based on your age, health, family medical history, use of contact lenses, and other factors. Follow your health care provider's recommendations for frequency of eye exams. Eat a healthy diet. Foods such as vegetables, fruits, whole grains, low-fat dairy products, and lean protein foods contain the nutrients you need without too many calories. Decrease your intake of foods high in solid fats, added sugars, and salt. Eat the right amount of calories for you. Get information about a proper diet from your health care provider, if necessary. Regular physical exercise is one of the most important things you can do for your health. Most adults should get at least 150 minutes of moderate-intensity exercise (any activity that increases your heart rate and causes you to sweat) each week. In addition, most adults need muscle-strengthening exercises on 2 or more days a week. Maintain a healthy weight. The body mass index (BMI) is a screening  tool to identify possible weight problems. It provides an estimate of body fat based on height and weight. Your health care provider can find your BMI and can help you achieve or maintain a healthy weight. For adults 20 years and older: A BMI below 18.5 is considered underweight. A BMI of 18.5 to 24.9 is normal. A BMI of 25 to 29.9 is considered overweight. A BMI of 30 and above is considered obese. Maintain normal blood lipids and cholesterol levels by exercising and minimizing your intake of saturated fat. Eat a balanced diet with plenty of fruit and vegetables. Blood tests for lipids and cholesterol should begin at age 20 and be repeated every 5 years. If your lipid or cholesterol levels are high, you are over 50, or you are at high risk for heart disease, you may need your cholesterol levels checked more frequently. Ongoing high lipid and cholesterol levels should be   treated with medicines if diet and exercise are not working. If you smoke, find out from your health care provider how to quit. If you do not use tobacco, do not start. Lung cancer screening is recommended for adults aged 55-80 years who are at high risk for developing lung cancer because of a history of smoking. A yearly low-dose CT scan of the lungs is recommended for people who have at least a 30-pack-year history of smoking and are a current smoker or have quit within the past 15 years. A pack year of smoking is smoking an average of 1 pack of cigarettes a day for 1 year (for example: 1 pack a day for 30 years or 2 packs a day for 15 years). Yearly screening should continue until the smoker has stopped smoking for at least 15 years. Yearly screening should be stopped for people who develop a health problem that would prevent them from having lung cancer treatment. If you choose to drink alcohol, do not have more than 2 drinks per day. One drink is considered to be 12 ounces (355 mL) of beer, 5 ounces (148 mL) of wine, or 1.5 ounces (44  mL) of liquor. Avoid use of street drugs. Do not share needles with anyone. Ask for help if you need support or instructions about stopping the use of drugs. High blood pressure causes heart disease and increases the risk of stroke. Your blood pressure should be checked at least every 1-2 years. Ongoing high blood pressure should be treated with medicines, if weight loss and exercise are not effective. If you are 45-79 years old, ask your health care provider if you should take aspirin to prevent heart disease. Diabetes screening involves taking a blood sample to check your fasting blood sugar level. Testing should be considered at a younger age or be carried out more frequently if you are overweight and have at least 1 risk factor for diabetes. Colorectal cancer can be detected and often prevented. Most routine colorectal cancer screening begins at the age of 50 and continues through age 75. However, your health care provider may recommend screening at an earlier age if you have risk factors for colon cancer. On a yearly basis, your health care provider may provide home test kits to check for hidden blood in the stool. Use of a small camera at the end of a tube to directly examine the colon (sigmoidoscopy or colonoscopy) can detect the earliest forms of colorectal cancer. Talk to your health care provider about this at age 50, when routine screening begins. Direct exam of the colon should be repeated every 5-10 years through age 75, unless early forms of precancerous polyps or small growths are found. Hepatitis C blood testing is recommended for all people born from 1945 through 1965 and any individual with known risks for hepatitis C. Screening for abdominal aortic aneurysm (AAA)  by ultrasound is recommended for people who have history of high blood pressure or who are current or former smokers. Healthy men should  receive prostate-specific antigen (PSA) blood tests as part of routine cancer screening.  Talk with your health care provider about prostate cancer screening. Testicular cancer screening is  recommended for adult males. Screening includes self-exam, a health care provider exam, and other screening tests. Consult with your health care provider about any symptoms you have or any concerns you have about testicular cancer. Use sunscreen. Apply sunscreen liberally and repeatedly throughout the day. You should seek shade when your shadow is shorter than   you. Protect yourself by wearing long sleeves, pants, a wide-brimmed hat, and sunglasses year round, whenever you are outdoors. Once a month, do a whole-body skin exam, using a mirror to look at the skin on your back. Tell your health care provider about new moles, moles that have irregular borders, moles that are larger than a pencil eraser, or moles that have changed in shape or color. Stay current with required vaccines (immunizations). Influenza vaccine. All adults should be immunized every year. Tetanus, diphtheria, and acellular pertussis (Td, Tdap) vaccine. An adult who has not previously received Tdap or who does not know his vaccine status should receive 1 dose of Tdap. This initial dose should be followed by tetanus and diphtheria toxoids (Td) booster doses every 10 years. Adults with an unknown or incomplete history of completing a 3-dose immunization series with Td-containing vaccines should begin or complete a primary immunization series including a Tdap dose. Adults should receive a Td booster every 10 years. Zoster vaccine. One dose is recommended for adults aged 60 years or older unless certain conditions are present.  PREVNAR - Pneumococcal 13-valent conjugate (PCV13) vaccine. When indicated, a person who is uncertain of his immunization history and has no record of immunization should receive the PCV13 vaccine. An adult aged 19 years or older who has certain medical conditions and has not been previously immunized should receive 1  dose of PCV13 vaccine. This PCV13 should be followed with a dose of pneumococcal polysaccharide (PPSV23) vaccine. The PPSV23 vaccine dose should be obtained 1 or more year(s)after the dose of PCV13 vaccine. An adult aged 19 years or older who has certain medical conditions and previously received 1 or more doses of PPSV23 vaccine should receive 1 dose of PCV13. The PCV13 vaccine dose should be obtained 1 or more years after the last PPSV23 vaccine dose.  PNEUMOVAX - Pneumococcal polysaccharide (PPSV23) vaccine. When PCV13 is also indicated, PCV13 should be obtained first. All adults aged 65 years and older should be immunized. An adult younger than age 65 years who has certain medical conditions should be immunized. Any person who resides in a nursing home or long-term care facility should be immunized. An adult smoker should be immunized. People with an immunocompromised condition and certain other conditions should receive both PCV13 and PPSV23 vaccines. People with human immunodeficiency virus (HIV) infection should be immunized as soon as possible after diagnosis. Immunization during chemotherapy or radiation therapy should be avoided. Routine use of PPSV23 vaccine is not recommended for American Indians, [place] Natives, or people younger than 65 years unless there are medical conditions that require PPSV23 vaccine. When indicated, people who have unknown immunization and have no record of immunization should receive PPSV23 vaccine. One-time revaccination 5 years after the first dose of PPSV23 is recommended for people aged 19-64 years who have chronic kidney failure, nephrotic syndrome, asplenia, or immunocompromised conditions. People who received 1-2 doses of PPSV23 before age 65 years should receive another dose of PPSV23 vaccine at age 65 years or later if at least 5 years have passed since the previous dose. Doses of PPSV23 are not needed for people immunized with PPSV23 at or after age 65  years.  Hepatitis A vaccine. Adults who wish to be protected from this disease, have certain high-risk conditions, work with hepatitis A-infected animals, work in hepatitis A research labs, or travel to or work in countries with a high rate of hepatitis A should be immunized. Adults who were previously unvaccinated and who anticipate close contact   with an international adoptee during the first 60 days after arrival in the [place] from a country with a high rate of hepatitis A should be immunized.  Hepatitis B vaccine. Adults should be immunized if they wish to be protected from this disease, have certain high-risk conditions, may be exposed to blood or other infectious body fluids, are household contacts or sex partners of hepatitis B positive people, are clients or workers in certain care facilities, or travel to or work in countries with a high rate of hepatitis B.  Preventive Service / Frequency  Ages 65 and over Blood pressure check. Lipid and cholesterol check. Lung cancer screening. / Every year if you are aged 55-80 years and have a 30-pack-year history of smoking and currently smoke or have quit within the past 15 years. Yearly screening is stopped once you have quit smoking for at least 15 years or develop a health problem that would prevent you from having lung cancer treatment. Fecal occult blood test (FOBT) of stool. You may not have to do this test if you get a colonoscopy every 10 years. Flexible sigmoidoscopy** or colonoscopy.** / Every 5 years for a flexible sigmoidoscopy or every 10 years for a colonoscopy beginning at age 50 and continuing until age 75. Hepatitis C blood test.** / For all people born from 1945 through 1965 and any individual with known risks for hepatitis C. Abdominal aortic aneurysm (AAA) screening./ Screening current or former smokers or have Hypertension. Skin self-exam. / Monthly. Influenza vaccine. / Every year. Tetanus, diphtheria, and acellular  pertussis (Tdap/Td) vaccine.** / 1 dose of Td every 10 years.  Zoster vaccine.** / 1 dose for adults aged 60 years or older.         Pneumococcal 13-valent conjugate (PCV13) vaccine.   Pneumococcal polysaccharide (PPSV23) vaccine.   Hepatitis A vaccine.** / Consult your health care provider. Hepatitis B vaccine.** / Consult your health care provider. Screening for abdominal aortic aneurysm (AAA)  by ultrasound is recommended for people who have history of high blood pressure or who are current or former smokers. ++++++++++ Recommend Adult Low Dose Aspirin or  coated  Aspirin 81 mg daily  To reduce risk of Colon Cancer 40 %,  Skin Cancer 26 % ,  Malignant Melanoma 46%  and  Pancreatic cancer 60% ++++++++++++++++++++++ Vitamin D goal  is between 70-100.  Please make sure that you are taking your Vitamin D as directed.  It is very important as a natural anti-inflammatory  helping hair, skin, and nails, as well as reducing stroke and heart attack risk.  It helps your bones and helps with mood. It also decreases numerous cancer risks so please take it as directed.  Low Vit D is associated with a 200-300% higher risk for CANCER  and 200-300% higher risk for HEART   ATTACK  &  STROKE.   ...................................... It is also associated with higher death rate at younger ages,  autoimmune diseases like Rheumatoid arthritis, Lupus, Multiple Sclerosis.    Also many other serious conditions, like depression, Alzheimer's Dementia, infertility, muscle aches, fatigue, fibromyalgia - just to name a few. ++++++++++++++++++++++ Recommend the book "The END of DIETING" by Dr Joel Fuhrman  & the book "The END of DIABETES " by Dr Joel Fuhrman At [employer] - get book & Audio CD's    Being diabetic has a  300% increased risk for heart attack, stroke, cancer, and alzheimer- type vascular dementia. It is very important that you work harder with diet by   avoiding all foods that are white. Avoid  white rice (brown & wild rice is OK), white potatoes (sweetpotatoes in moderation is OK), White bread or wheat bread or anything made out of white flour like bagels, donuts, rolls, buns, biscuits, cakes, pastries, cookies, pizza crust, and pasta (made from white flour & egg whites) - vegetarian pasta or spinach or wheat pasta is OK. Multigrain breads like Arnold's or Pepperidge [employer], or multigrain sandwich thins or flatbreads.  Diet, exercise and weight loss can reverse and cure diabetes in the early stages.  Diet, exercise and weight loss is very important in the control and prevention of complications of diabetes which affects every system in your body, ie. Brain - dementia/stroke, eyes - glaucoma/blindness, heart - heart attack/heart failure, kidneys - dialysis, stomach - gastric paralysis, intestines - malabsorption, nerves - severe painful neuritis, circulation - gangrene & loss of a leg(s), and finally cancer and Alzheimers.    I recommend avoid fried & greasy foods,  sweets/candy, white rice (brown or wild rice or Quinoa is OK), white potatoes (sweet potatoes are OK) - anything made from white flour - bagels, doughnuts, rolls, buns, biscuits,white and wheat breads, pizza crust and traditional pasta made of white flour & egg white(vegetarian pasta or spinach or wheat pasta is OK).  Multi-grain bread is OK - like multi-grain flat bread or sandwich thins. Avoid alcohol in excess. Exercise is also important.    Eat all the vegetables you want - avoid meat, especially red meat and dairy - especially cheese.  Cheese is the most concentrated form of trans-fats which is the worst thing to clog up our arteries. Veggie cheese is OK which can be found in the fresh produce section at [employer] or [employer] or [employer]  ++++++++++++++++++++++ DASH Eating Plan  DASH stands for "Dietary Approaches to Stop Hypertension."   The DASH eating plan is a healthy eating plan that has been shown to reduce high  blood pressure (hypertension). Additional health benefits may include reducing the risk of type 2 diabetes mellitus, heart disease, and stroke. The DASH eating plan may also help with weight loss. WHAT DO I NEED TO KNOW ABOUT THE DASH EATING PLAN? For the DASH eating plan, you will follow these general guidelines: Choose foods with a percent daily value for sodium of less than 5% (as listed on the food label). Use salt-free seasonings or herbs instead of table salt or sea salt. Check with your health care provider or pharmacist before using salt substitutes. Eat lower-sodium products, often labeled as "lower sodium" or "no salt added." Eat fresh foods. Eat more vegetables, fruits, and low-fat dairy products. Choose whole grains. Look for the word "whole" as the first word in the ingredient list. Choose fish  Limit sweets, desserts, sugars, and sugary drinks. Choose heart-healthy fats. Eat veggie cheese  Eat more home-cooked food and less restaurant, buffet, and fast food. Limit fried foods. Cook foods using methods other than frying. Limit canned vegetables. If you do use them, rinse them well to decrease the sodium. When eating at a restaurant, ask that your food be prepared with less salt, or no salt if possible.                      WHAT FOODS CAN I EAT? Read Dr Joel Fuhrman's books on The End of Dieting & The End of Diabetes  Grains Whole grain or whole wheat bread. Brown rice. Whole grain or whole wheat pasta. Quinoa, bulgur, and   whole grain cereals. Low-sodium cereals. Corn or whole wheat flour tortillas. Whole grain cornbread. Whole grain crackers. Low-sodium crackers.  Vegetables Fresh or frozen vegetables (raw, steamed, roasted, or grilled). Low-sodium or reduced-sodium tomato and vegetable juices. Low-sodium or reduced-sodium tomato sauce and paste. Low-sodium or reduced-sodium canned vegetables.   Fruits All fresh, canned (in natural juice), or frozen fruits.  Protein  Products  All fish and seafood.  Dried beans, peas, or lentils. Unsalted nuts and seeds. Unsalted canned beans.  Dairy Low-fat dairy products, such as skim or 1% milk, 2% or reduced-fat cheeses, low-fat ricotta or cottage cheese, or plain low-fat yogurt. Low-sodium or reduced-sodium cheeses.  Fats and Oils Tub margarines without trans fats. Light or reduced-fat mayonnaise and salad dressings (reduced sodium). Avocado. Safflower, olive, or canola oils. Natural peanut or almond butter.  Other Unsalted popcorn and pretzels. The items listed above may not be a complete list of recommended foods or beverages. Contact your dietitian for more options.  ++++++++++++++++++++  WHAT FOODS ARE NOT RECOMMENDED? Grains/ White flour or wheat flour White bread. White pasta. White rice. Refined cornbread. Bagels and croissants. Crackers that contain trans fat.  Vegetables  Creamed or fried vegetables. Vegetables in a . Regular canned vegetables. Regular canned tomato sauce and paste. Regular tomato and vegetable juices.  Fruits Dried fruits. Canned fruit in light or heavy syrup. Fruit juice.  Meat and Other Protein Products Meat in general - RED meat & White meat.  Fatty cuts of meat. Ribs, chicken wings, all processed meats as bacon, sausage, bologna, salami, fatback, hot dogs, bratwurst and packaged luncheon meats.  Dairy Whole or 2% milk, cream, half-and-half, and cream cheese. Whole-fat or sweetened yogurt. Full-fat cheeses or blue cheese. Non-dairy creamers and whipped toppings. Processed cheese, cheese spreads, or cheese curds.  Condiments Onion and garlic salt, seasoned salt, table salt, and sea salt. Canned and packaged gravies. Worcestershire sauce. Tartar sauce. Barbecue sauce. Teriyaki sauce. Soy sauce, including reduced sodium. Steak sauce. Fish sauce. Oyster sauce. Cocktail sauce. Horseradish. Ketchup and mustard. Meat flavorings and tenderizers. Bouillon cubes. Hot sauce. Tabasco sauce.  Marinades. Taco seasonings. Relishes.  Fats and Oils Butter, stick margarine, lard, shortening and bacon fat. Coconut, palm kernel, or palm oils. Regular salad dressings.  Pickles and olives. Salted popcorn and pretzels.  The items listed above may not be a complete list of foods and beverages to avoid.   

## 2021-09-09 NOTE — Progress Notes (Signed)
Annual  Screening/Preventative Visit  & Comprehensive Evaluation & Examination  Future Appointments  Date Time Provider [place] [facility]  09/10/2021 11:00 AM [name], MD [facility] None  09/14/2021 10:00 AM Marzetta Board, DPM [facility]  09/15/2021 11:40 AM Shirley Friar, PA-C [facility]  09/29/2021 11:40 AM Larey Dresser, MD [facility] None  11/03/2021 10:30 AM Deboraha Sprang, MD [facility]  12/29/2021 10:30 AM Wyatt Portela, MD [facility] None  05/17/2022     -    Wellness 11:00 AM Liane Comber, NP [facility] None  09/12/2022    -    CPE 11:00 AM [name], MD [facility] None            This very nice 80 y.o. [facility] presents for a Screening /Preventative Visit & comprehensive evaluation and management of multiple medical co-morbidities.  Patient has been followed for HTN, ASCVD/HxCVA, HLD, T2_NIDDM  and Vitamin D Deficiency.  Abdominal CT scan in 2016 found Aortic Atherosclerosis. Patient has hx/o MGUS ( 2004) followed by Dr Alen Blew. Patient is accompanied today by his son - [name] from [place].        HTN predates since  61. Patient's BP has been controlled at home.  Today's BP is at goal -  122/70. 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  He has persistent Rt HP and ataxic gait.  His cardiologists are Dr Aundra Dubin & Dr Caryl Comes. Patient denies any cardiac symptoms as chest pain, palpitations, shortness of breath, dizziness , but does admit moderate swelling of legs despite doubling Lasix dose 40 ->80 mg 1 x /day.       Patient's hyperlipidemia is controlled with diet and Atorvastatin.  Patient denies myalgias or other medication SE's. 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 Last A1c was near goal:   Lab Results  Component Value Date   HGBA1C 5.9 (H) 05/17/2021          Finally, patient has history of Vitamin D Deficiency ("30" /2008) and last vitamin D was at goal:   Lab Results  Component Value Date   VD25OH 81 01/29/2021     Current Outpatient Medications on File Prior to Visit  Medication Sig   atorvastatin (LIPITOR) 40 MG tablet TAKE 1 TABLET  DAILY    VITAMIN D Take 1 tablet daily.    Vit B-12 50 MCG TABS Take  daily.   DULoxetine  20 MG capsule Take 1 cap daily for mood and pain.   ELIQUIS 5 MG TABS tablet TAKE 1 TABLET TWICE DAILY    finasteride 5 MG tablet TAKE 1 TABLET  DAILY FOR PROSTATE   furosemide  40 MG tablet Takes 2 tablets  daily.   gabapentin (300 MG capsule Take  1 to 2 capsules  1 hour before Bedtime     glipiZIDE  5 MG tablet Take 1/2 tablet  2 x /day  with Meals    levothyroxine  50 MCG tablet TAKE  1  TAB SunThTh, Take 1/2 TAB MWFSat   losartan 25 MG tablet Take 1 tablet daily.  Magnesium 250 MG TABS Take 250 mg by mouth daily.    metFORMIN-XR) 500 MG  TAKE 2 TABLETS  2  TIMES DAILY    metoprolol succinate XL) 25 MG 24 hr tablet Take 1 tablet  at bedtime.   Multiple Vitamin  Take 1 capsule daily.   OCEAN 0.65 % nasal spray Place 2 sprays into the nose as needed    triamcinolone oint 0.1 % Apply to rash 2 to 3 x /day   ASPERCREME 10 % cream Apply 1 application  daily as needed    vitamin C (ASCORBIC ACID) 500 MG tablet Take  daily.    Allergies  Allergen Reactions   Penicillins Other (See Comments)    Health Maintenance  Topic Date Due   COVID-19 Vacc (4-Booster for Pfizer series) 04/21/2021   INFLUENZA VACCINE  06/21/2021   FOOT EXAM  07/12/2021   Zoster Vaccines- Shingrix (2 of 2) 07/22/2021   OPHTHALMOLOGY EXAM  10/08/2021   HEMOGLOBIN A1C  11/16/2021   TETANUS/TDAP  02/08/2025   Pneumonia Vaccine - 23  Completed   HPV  VACCINES  Aged Out    Immunization History  Administered Date(s) Administered   DT  02/09/2015   Influenza Split 08/07/2013   Influenza, High Dose  08/15/2017, 08/15/2018, 09/06/2019, 10/19/2020   PFIZER SARS-COV-2 Vacc 12/11/2019, 01/01/2020, 02/24/2021   Pneumococcal 13 06/10/2016   Pneumococcal -23 08/13/2008   Td 11/22/2003   Zoster Recombinat (Shingrix) 05/27/2021    Last Colon - 05/01/2019 - Dr Havery Moros - recc 3-5 yr , but deferred to "age out"   Past Surgical History:  Procedure Laterality Date   CIRCUMCISION N/A 06/11/2019   Procedure: CIRCUMCISION ADULT;  Surgeon: [name], MD;  Location: [facility];  Service: Urology;  Laterality: N/A;   COLONOSCOPY     INGUINAL HERNIA REPAIR Bilateral    1968 and 2004   LOOP RECORDER INSERTION N/A 07/28/2017   Procedure: LOOP RECORDER INSERTION;  Surgeon: Deboraha Sprang, MD;  Location: [facility];  Service: Cardiovascular;  Laterality: N/A;   TEE WITHOUT CARDIOVERSION N/A 07/28/2017   Procedure: TRANSESOPHAGEAL ECHOCARDIOGRAM (TEE);  Surgeon: Sueanne Margarita, MD;  Location: [facility];  Service: Cardiovascular;  Laterality: N/A;     Family History  Problem Relation Age of Onset   Sickle cell anemia Mother    Diabetes Father    Stomach cancer Father    Colon cancer Father    CAD Neg Hx    Esophageal cancer Neg Hx    Rectal cancer Neg Hx      Social History   Tobacco Use   Smoking status: Never   Smokeless tobacco: Never  Vaping Use   Vaping Use: Never used  Substance Use Topics   Alcohol use: No   Drug use: No      ROS Constitutional: Denies fever, chills, weight loss/gain, headaches, insomnia,  night sweats or change in appetite. Does c/o fatigue. Eyes: Denies redness, blurred vision, diplopia, discharge, itchy or watery eyes.  ENT: Denies discharge, congestion, post nasal drip, epistaxis, sore throat, earache, hearing loss, dental pain, Tinnitus, Vertigo, Sinus pain or snoring.  Cardio: Denies chest  pain, palpitations, irregular heartbeat, syncope, dyspnea, diaphoresis, orthopnea, PND, claudication or edema Respiratory: denies cough, dyspnea, DOE, pleurisy, hoarseness, laryngitis or wheezing.  Gastrointestinal: Denies dysphagia, heartburn, reflux, water brash, pain, cramps, nausea, vomiting, bloating, diarrhea, constipation, hematemesis, melena, hematochezia, jaundice or hemorrhoids Genitourinary: Denies dysuria, frequency, urgency, nocturia, hesitancy, discharge, hematuria or flank pain Musculoskeletal: Denies arthralgia, myalgia,  stiffness, Jt. Swelling, pain, limp or strain/sprain. Denies Falls. Skin: Denies puritis, rash, hives, warts, acne, eczema or change in skin lesion Neuro: No weakness, tremor, incoordination, spasms, paresthesia or pain Psychiatric: Denies confusion, memory loss or sensory loss. Denies Depression. Endocrine: Denies change in weight, skin, hair change, nocturia, and paresthesia, diabetic polys, visual blurring or hyper / hypo glycemic episodes.  Heme/Lymph: No excessive bleeding, bruising or enlarged lymph nodes.   Physical Exam  BP 122/70   Pulse 79   Temp 97.6 F (36.4 C)   Resp 16   Ht 5\' 11"  (1.803 m)   Wt 182 lb 6.4 oz (82.7 kg)   SpO2 98%   BMI 25.44 kg/m   General Appearance: Well nourished and well groomed and in no apparent distress.  Eyes: PERRLA, EOMs, conjunctiva no swelling or erythema, normal fundi and vessels. Sinuses: No frontal/maxillary tenderness ENT/Mouth: EACs patent / TMs  nl. Nares clear without erythema, swelling, mucoid exudates. Oral hygiene is good. No erythema, swelling, or exudate. Tongue normal, non-obstructing. Tonsils not swollen or erythematous. Hearing normal.  Neck: Supple, thyroid not palpable. No bruits, nodes or JVD. Respiratory: Respiratory effort normal.  BS equal and clear bilateral without rales, rhonci, wheezing or stridor. Cardio: Heart sounds are normal with regular rate and rhythm and no murmurs, rubs or  gallops. Pedal pulses are obscured 2+ pitting edema to the knees & 3-4 + ankle edema. No aortic or femoral bruits. Chest: symmetric with normal excursions and percussion.  Abdomen: Soft, with Nl bowel sounds. Nontender, no guarding, rebound, hernias, masses, or organomegaly.  Lymphatics: Non tender without lymphadenopathy.  Musculoskeletal: Mild spastic Rt HP w/shuffling /limping gait using a cane for support.  Skin: Warm and dry without rashes, lesions, cyanosis, clubbing or  ecchymosis.  Neuro: Cranial nerves intact, reflexes equal bilaterally. Mild Rt spastic HP. Sensation to touch & Vibratory  intact.  Pysch: Alert and oriented X 3 with normal affect, insight and judgment appropriate.   Assessment and Plan  1. Annual Preventative/Screening Exam    2. Essential hypertension  - Advised increase Lasix 80 mg bid & recc monitor daily weights.   - EKG 12-Lead - [place], RETROPERITNL ABD,  LTD - Urinalysis, Routine w reflex microscopic - Microalbumin / creatinine urine ratio - CBC with Differential/Platelet - COMPLETE METABOLIC PANEL WITH GFR - Magnesium - TSH  3. Hyperlipidemia associated with type 2 diabetes mellitus ([place])  - EKG 12-Lead - [place], RETROPERITNL ABD,  LTD - Lipid panel - TSH  4. Type 2 diabetes mellitus with stage 2 chronic kidney  disease, without long-term current use of insulin (HCC)  - EKG 12-Lead - [place], RETROPERITNL ABD,  LTD - HM DIABETES FOOT EXAM - LOW EXTREMITY NEUR EXAM DOCUM - Hemoglobin A1c - Insulin, random  5. Vitamin D deficiency   6. Paroxysmal atrial fibrillation (HCC)  - EKG 12-Lead - TSH  7. Abdominal aortic atherosclerosis ([place]) by Abd CTscan  2016  - EKG 12-Lead - [place], RETROPERITNL ABD,  LTD  8. Hypothyroidism - TSH  9. MGUS (monoclonal gammopathy of unknown significance)  - CBC with Differential/Platelet - COMPLETE METABOLIC PANEL WITH GFR  10. BPH with obstruction/lower urinary tract symptoms  - PSA  11. Screening for  colorectal cancer  - POC Hemoccult Bld/Stl   12. Prostate cancer screening  - PSA  13. Screening for heart disease  - EKG 12-Lead  14. FHx: heart disease  - EKG 12-Lead  15. Screening for AAA (aortic abdominal aneurysm)  - [place], RETROPERITNL ABD,  LTD  16. Medication management  - Urinalysis, Routine w reflex microscopic - Microalbumin / creatinine urine ratio - CBC with Differential/Platelet - COMPLETE METABOLIC PANEL WITH GFR - Magnesium - Lipid panel - TSH - Hemoglobin A1c - Insulin, random - VITAMIN D 25 Hydroxy         Patient was counseled in prudent diet, weight control to achieve/maintain BMI less than 25, BP monitoring, regular exercise and medications as discussed.  Discussed med effects and SE's. Routine screening labs and tests as requested with regular follow-up as recommended. Over 40 minutes of exam, counseling, chart review and high complex critical decision making was performed   Kirtland Bouchard, MD

## 2021-09-10 ENCOUNTER — Other Ambulatory Visit: Payer: Self-pay

## 2021-09-10 ENCOUNTER — Ambulatory Visit (INDEPENDENT_AMBULATORY_CARE_PROVIDER_SITE_OTHER): Payer: Medicare Other | Admitting: Internal Medicine

## 2021-09-10 VITALS — BP 122/70 | HR 79 | Temp 97.6°F | Resp 16 | Ht 71.0 in | Wt 182.4 lb

## 2021-09-10 DIAGNOSIS — I10 Essential (primary) hypertension: Secondary | ICD-10-CM

## 2021-09-10 DIAGNOSIS — D472 Monoclonal gammopathy: Secondary | ICD-10-CM

## 2021-09-10 DIAGNOSIS — Z136 Encounter for screening for cardiovascular disorders: Secondary | ICD-10-CM

## 2021-09-10 DIAGNOSIS — I480 Paroxysmal atrial fibrillation: Secondary | ICD-10-CM | POA: Diagnosis not present

## 2021-09-10 DIAGNOSIS — I700 Atherosclerosis of aorta: Secondary | ICD-10-CM

## 2021-09-10 DIAGNOSIS — E559 Vitamin D deficiency, unspecified: Secondary | ICD-10-CM

## 2021-09-10 DIAGNOSIS — Z125 Encounter for screening for malignant neoplasm of prostate: Secondary | ICD-10-CM

## 2021-09-10 DIAGNOSIS — E785 Hyperlipidemia, unspecified: Secondary | ICD-10-CM

## 2021-09-10 DIAGNOSIS — Z0001 Encounter for general adult medical examination with abnormal findings: Secondary | ICD-10-CM

## 2021-09-10 DIAGNOSIS — N138 Other obstructive and reflux uropathy: Secondary | ICD-10-CM

## 2021-09-10 DIAGNOSIS — Z1211 Encounter for screening for malignant neoplasm of colon: Secondary | ICD-10-CM

## 2021-09-10 DIAGNOSIS — E039 Hypothyroidism, unspecified: Secondary | ICD-10-CM

## 2021-09-10 DIAGNOSIS — Z0000 Encounter for general adult medical examination without abnormal findings: Secondary | ICD-10-CM

## 2021-09-10 DIAGNOSIS — E1122 Type 2 diabetes mellitus with diabetic chronic kidney disease: Secondary | ICD-10-CM | POA: Diagnosis not present

## 2021-09-10 DIAGNOSIS — Z8249 Family history of ischemic heart disease and other diseases of the circulatory system: Secondary | ICD-10-CM

## 2021-09-10 DIAGNOSIS — E1169 Type 2 diabetes mellitus with other specified complication: Secondary | ICD-10-CM

## 2021-09-10 DIAGNOSIS — N182 Chronic kidney disease, stage 2 (mild): Secondary | ICD-10-CM

## 2021-09-10 DIAGNOSIS — Z79899 Other long term (current) drug therapy: Secondary | ICD-10-CM

## 2021-09-10 LAB — VITAMIN D 25 HYDROXY (VIT D DEFICIENCY, FRACTURES): Vit D, 25-Hydroxy: 82 ng/mL (ref 30–100)

## 2021-09-10 MED ORDER — FUROSEMIDE 80 MG PO TABS: ORAL_TABLET | ORAL | 3 refills | Status: DC

## 2021-09-12 ENCOUNTER — Encounter: Payer: Self-pay | Admitting: Internal Medicine

## 2021-09-12 NOTE — Progress Notes (Signed)
============================================================ -   Test results slightly outside the reference range are not unusual. If there is anything important, I will review this with you,  otherwise it is considered normal test values.  If you have further questions,  please do not hesitate to contact me at the office or via My Chart.  ============================================================ ============================================================  -  Vitamin D = 82  - Excellent - Please keep dose same  ============================================================ ============================================================  - PSA - Very low - Great  ! ============================================================ ============================================================  - CBC shows  sl decrease in Hgb / Red blood Cell count - Recommend continue   Your Multivitamin & also add a Super B complex to get extra B vitamins ============================================================ ============================================================  - Kidney functions still Stage 3a & stable ============================================================ ============================================================  - Total Chol = 105    &  LDL Chol = 44  - Both  Excellent   - Very low risk for Heart Attack  / Stroke ============================================================ ============================================================  -  Suggest cut your  Atorvastatin 40 mg  dose in 1/2  ============================================================ ============================================================  - A1c = 6.0% = Great  ! ============================================================ ============================================================  - All Else - Kidneys - Electrolytes - Liver - Magnesium & Thyroid    - all  Normal /  OK ============================================================ ============================================================  - Keep up the [place] Work  !  ============================================================ ============================================================

## 2021-09-13 LAB — CBC WITH DIFFERENTIAL/PLATELET
Absolute Monocytes: 396 cells/uL (ref 200–950)
Basophils Absolute: 40 cells/uL (ref 0–200)
Basophils Relative: 1 %
Eosinophils Absolute: 92 cells/uL (ref 15–500)
Eosinophils Relative: 2.3 %
HCT: 33.5 % — ABNORMAL LOW (ref 38.5–50.0)
Hemoglobin: 11.5 g/dL — ABNORMAL LOW (ref 13.2–17.1)
Lymphs Abs: 1136 cells/uL (ref 850–3900)
MCH: 30.1 pg (ref 27.0–33.0)
MCHC: 34.3 g/dL (ref 32.0–36.0)
MCV: 87.7 fL (ref 80.0–100.0)
MPV: 10.8 fL (ref 7.5–12.5)
Monocytes Relative: 9.9 %
Neutro Abs: 2336 cells/uL (ref 1500–7800)
Neutrophils Relative %: 58.4 %
Platelets: 161 10*3/uL (ref 140–400)
RBC: 3.82 10*6/uL — ABNORMAL LOW (ref 4.20–5.80)
RDW: 13.4 % (ref 11.0–15.0)
Total Lymphocyte: 28.4 %
WBC: 4 10*3/uL (ref 3.8–10.8)

## 2021-09-13 LAB — INSULIN, RANDOM: Insulin: 21.2 u[IU]/mL — ABNORMAL HIGH

## 2021-09-13 LAB — COMPLETE METABOLIC PANEL WITH GFR
AG Ratio: 1.6 (calc) (ref 1.0–2.5)
ALT: 52 U/L — ABNORMAL HIGH (ref 9–46)
AST: 41 U/L — ABNORMAL HIGH (ref 10–35)
Albumin: 3.5 g/dL — ABNORMAL LOW (ref 3.6–5.1)
Alkaline phosphatase (APISO): 72 U/L (ref 35–144)
BUN/Creatinine Ratio: 20 (calc) (ref 6–22)
BUN: 25 mg/dL (ref 7–25)
CO2: 27 mmol/L (ref 20–32)
Calcium: 9.4 mg/dL (ref 8.6–10.3)
Chloride: 106 mmol/L (ref 98–110)
Creat: 1.25 mg/dL — ABNORMAL HIGH (ref 0.70–1.22)
Globulin: 2.2 g/dL (calc) (ref 1.9–3.7)
Glucose, Bld: 176 mg/dL — ABNORMAL HIGH (ref 65–99)
Potassium: 4.8 mmol/L (ref 3.5–5.3)
Sodium: 142 mmol/L (ref 135–146)
Total Bilirubin: 1 mg/dL (ref 0.2–1.2)
Total Protein: 5.7 g/dL — ABNORMAL LOW (ref 6.1–8.1)
eGFR: 58 mL/min/{1.73_m2} — ABNORMAL LOW (ref >=60)

## 2021-09-13 LAB — LIPID PANEL
Cholesterol: 105 mg/dL (ref <200)
HDL: 49 mg/dL (ref >=40)
LDL Cholesterol (Calc): 44 mg/dL (calc)
Non-HDL Cholesterol (Calc): 56 mg/dL (calc) (ref <130)
Total CHOL/HDL Ratio: 2.1 (calc) (ref <5.0)
Triglycerides: 41 mg/dL (ref <150)

## 2021-09-13 LAB — URINALYSIS, ROUTINE W REFLEX MICROSCOPIC
Bacteria, UA: NONE SEEN /HPF
Bilirubin Urine: NEGATIVE
Glucose, UA: NEGATIVE
Hgb urine dipstick: NEGATIVE
Hyaline Cast: NONE SEEN /LPF
Ketones, ur: NEGATIVE
Leukocytes,Ua: NEGATIVE
Nitrite: NEGATIVE
Specific Gravity, Urine: 1.015 (ref 1.001–1.035)
Squamous Epithelial / HPF: NONE SEEN /HPF (ref <=5)
WBC, UA: NONE SEEN /HPF (ref 0–5)
pH: 7 (ref 5.0–8.0)

## 2021-09-13 LAB — HEMOGLOBIN A1C
Hgb A1c MFr Bld: 6 % of total Hgb — ABNORMAL HIGH (ref <5.7)
Mean Plasma Glucose: 126 mg/dL
eAG (mmol/L): 7 mmol/L

## 2021-09-13 LAB — MICROALBUMIN / CREATININE URINE RATIO
Creatinine, Urine: 91 mg/dL (ref 20–320)
Microalb Creat Ratio: 381 mcg/mg creat — ABNORMAL HIGH (ref <30)
Microalb, Ur: 34.7 mg/dL

## 2021-09-13 LAB — MAGNESIUM: Magnesium: 1.9 mg/dL (ref 1.5–2.5)

## 2021-09-13 LAB — TSH: TSH: 0.69 mIU/L (ref 0.40–4.50)

## 2021-09-13 LAB — PSA: PSA: 0.31 ng/mL (ref <=4.00)

## 2021-09-14 ENCOUNTER — Ambulatory Visit (INDEPENDENT_AMBULATORY_CARE_PROVIDER_SITE_OTHER): Payer: Medicare Other | Admitting: Podiatry

## 2021-09-14 ENCOUNTER — Encounter: Payer: Self-pay | Admitting: Podiatry

## 2021-09-14 ENCOUNTER — Other Ambulatory Visit: Payer: Self-pay

## 2021-09-14 DIAGNOSIS — M79676 Pain in unspecified toe(s): Secondary | ICD-10-CM

## 2021-09-14 DIAGNOSIS — E1142 Type 2 diabetes mellitus with diabetic polyneuropathy: Secondary | ICD-10-CM | POA: Diagnosis not present

## 2021-09-14 DIAGNOSIS — B351 Tinea unguium: Secondary | ICD-10-CM | POA: Diagnosis not present

## 2021-09-14 DIAGNOSIS — L84 Corns and callosities: Secondary | ICD-10-CM

## 2021-09-15 ENCOUNTER — Encounter: Payer: Medicare Other | Admitting: Student

## 2021-09-16 DIAGNOSIS — E1122 Type 2 diabetes mellitus with diabetic chronic kidney disease: Secondary | ICD-10-CM | POA: Diagnosis not present

## 2021-09-16 DIAGNOSIS — N182 Chronic kidney disease, stage 2 (mild): Secondary | ICD-10-CM | POA: Diagnosis not present

## 2021-09-16 DIAGNOSIS — I69351 Hemiplegia and hemiparesis following cerebral infarction affecting right dominant side: Secondary | ICD-10-CM | POA: Diagnosis not present

## 2021-09-16 DIAGNOSIS — E1141 Type 2 diabetes mellitus with diabetic mononeuropathy: Secondary | ICD-10-CM | POA: Diagnosis not present

## 2021-09-16 DIAGNOSIS — I69393 Ataxia following cerebral infarction: Secondary | ICD-10-CM | POA: Diagnosis not present

## 2021-09-16 DIAGNOSIS — I480 Paroxysmal atrial fibrillation: Secondary | ICD-10-CM | POA: Diagnosis not present

## 2021-09-19 NOTE — Progress Notes (Signed)
  Subjective:  Patient ID: Earl [name], male    DOB: [birth year],  MRN: [id number]  Ferry Matthis presents to clinic today for preventative diabetic foot care and callus(es) left hallux and painful thick toenails that are difficult to trim. Painful toenails interfere with ambulation. Aggravating factors include wearing enclosed shoe gear. Pain is relieved with periodic professional debridement. Painful calluses are aggravated when weightbearing with and without shoegear. Pain is relieved with periodic professional debridement.  Patient states blood glucose was 100 mg/dl today.    He wears compression hose daily.  Patient's daughter is present during today's visit.  PCP is [name], MD , and last visit was 09/10/2021.  Allergies  Allergen Reactions   Penicillins Other (See Comments)    Did it involve swelling of the face/tongue/throat, SOB, or low BP? Unknown Did it involve sudden or severe rash/hives, skin peeling, or any reaction on the inside of your mouth or nose? Unknown Did you need to seek medical attention at a hospital or doctor's office? No When did it last happen?    Over 81 Years Ago   If all above answers are "NO", may proceed with cephalosporin use.      Review of Systems: Negative except as noted in the HPI. Objective:   Constitutional Vallie Teters is a pleasant 80 y.o. African American male, WD, WN in NAD. AAO x 3.   Vascular CFT immediate b/l LE. Palpable DP/PT pulses b/l LE. Digital hair present b/l. Skin temperature gradient WNL b/l. No pain with calf compression b/l. No edema noted b/l. Evidence of chronic venous insufficiency b/l lower extremities. No cyanosis or clubbing noted.  Neurologic Normal speech. Oriented to person, place, and time. Protective sensation intact 5/5 intact bilaterally with 10g monofilament b/l. Vibratory sensation intact b/l.  Dermatologic Pedal skin is warm and supple b/l LE. No open wounds b/l LE. Toenails 1-5 b/l  elongated, discolored, dystrophic, thickened, crumbly with subungual debris and tenderness to dorsal palpation. Preulcerative lesion noted L hallux. There is visible subdermal hemorrhage. There is no surrounding erythema, no edema, no drainage, no odor, no fluctuance.  Orthopedic: Normal muscle strength 5/5 to all lower extremity muscle groups bilaterally. No pain crepitus or joint limitation noted with ROM b/l lower extremities. No gross bony deformities b/l lower extremities.   Radiographs: None Assessment:   1. Pain due to onychomycosis of toenail   2. Callus   3. Diabetic polyneuropathy associated with type 2 diabetes mellitus ([place])    Plan:  Patient was evaluated and treated and all questions answered. Consent given for treatment as described below: -Examined patient. -No new findings. No new orders. -Recommended open-toed compression hose to prevent pressure on toes. -Continue diabetic foot care principles: inspect feet daily, monitor glucose as recommended by PCP and/or Endocrinologist, and follow prescribed diet per PCP, Endocrinologist and/or dietician. -Preulcerative lesion pared L hallux. Total number pared=1. -Dispensed digital toe cap for left hallux. Apply every morning. Remove every evening. -Patient/POA to call should there be question/concern in the interim.  Return in about 3 months (around 12/15/2021).  Marzetta Board, DPM

## 2021-09-19 NOTE — Progress Notes (Signed)
Cardiology Office Note Date:  09/19/2021  Patient ID:  Earl [name], Earl [name] 1941-08-17, MRN [id number] PCP:  [name], MD  Cardiologist:  Dr. Graciela Husbands    Chief Complaint:   4 week f/u  History of Present Illness: Earl [name] is a 80 y.o. male with history of DM, HTN >>> relative hypotension, HLD, cryptogenic stroke > loop > Afib.  I saw him 12/28/20 He is feeling OK. Wishes he could get out in his yard and do more around the ouse, gives an example like up on the roof for maintenance and cleaning. He says his family tells him he can not do those anymore. I urged that they are right! He denies any difficulties, since his stroke his balance is off and walks with the aid of a cane.  No CP, palpitations or cardiac awareness.  No SOB or DOE with ADLs No near syncoe or syncope. He denies falls. He reports home BPs generally 110's/60's, having some meds stopped/down titrated 2/2 low BPs by his PMD No orthostatic symptoms No bleeding or signs of bleeding 0% AF burden No changes were made  More recently saw A. Tillery, PA-C on 08/19/21, he had been of late with volume OL< recent TTE noted LVEF 25-30% and severely elevated PA pressure, myoview with no ischemia, EF 27%. He was feeling improved with lasix. Felt to be euvolemic, toprol, losartan added with consideration for Entresto at follow up if tolerating. Consideration as well for advancing GDMT with spiro, bidil, SGLT2i, and planned for HF team referral. Echo once GDMT on board.  He sees dr. Shirlee Latch in consult 09/29/21  TODAY He is accompanied by his daughter today. He is doing better, but not to his baseline. His edema is markedly improved by his estimation but still has some, at it's worst was up into his thighs. His breathing is better, never had rest SOB, but at one time was SOB walking across the room. Now he can do more, go farther, but again in comparison to a year ago, far from his baseline The patient saw his PMD  who doubled his lasix to 80mg  BID about a week ago, he has had brisk increase in urine output with this His weight by his home scale down 9lbs, his daughter mentions that his home scale is duct taped together and she wonders about it's accuracy. By ours 17.  His daughter mentions that physc=ically he remains weak, gait is somewhat unsteady, he is working with PT and is helping but thinks he needs more.  No near syncope or syncope, infrequently mentions slight lightheaded upon standing this dates back years and is no worse  No bleeding or signs of bleeding   Device information MDT ILR, implanted 07/28/2017, cryptogenic stroke >>> + finding of AF Has been removed   Past Medical History:  Diagnosis Date   Adenomatous colon polyp    Allergy    Anal fissure    Arthritis    BPH (benign prostatic hyperplasia)    Diabetes mellitus (HCC)    GERD (gastroesophageal reflux disease)    Hyperlipidemia    Hypertension    Hypothyroidism    Other testicular hypofunction    Stroke [facility])     Past Surgical History:  Procedure Laterality Date   CIRCUMCISION N/A 06/11/2019   Procedure: CIRCUMCISION ADULT;  Surgeon: 06/13/2019, MD;  Location: [facility];  Service: Urology;  Laterality: N/A;   COLONOSCOPY     INGUINAL HERNIA REPAIR Bilateral    1968 and 2004  LOOP RECORDER INSERTION N/A 07/28/2017   Procedure: LOOP RECORDER INSERTION;  Surgeon: Deboraha Sprang, MD;  Location: [facility];  Service: Cardiovascular;  Laterality: N/A;   TEE WITHOUT CARDIOVERSION N/A 07/28/2017   Procedure: TRANSESOPHAGEAL ECHOCARDIOGRAM (TEE);  Surgeon: Sueanne Margarita, MD;  Location: [facility] ENDOSCOPY;  Service: Cardiovascular;  Laterality: N/A;    Current Outpatient Medications  Medication Sig Dispense Refill   atorvastatin (LIPITOR) 40 MG tablet TAKE 1 TABLET BY MOUTH  DAILY FOR CHOLESTEROL 90 tablet 3   Blood Glucose Monitoring Suppl (ONE TOUCH ULTRA 2) w/Device KIT Check blood sugar 1 time a day-DX-E11.9 1  kit 0   Cholecalciferol (VITAMIN D PO) Take 1 tablet by mouth daily.      Cyanocobalamin (B-12) 50 MCG TABS Take 50 mcg by mouth daily.     DULoxetine (CYMBALTA) 20 MG capsule Take 1 cap daily for mood and pain. 90 capsule 0   ELIQUIS 5 MG TABS tablet TAKE 1 TABLET BY MOUTH  TWICE DAILY TO PREVENT  BLOOD CLOTS 180 tablet 3   finasteride (PROSCAR) 5 MG tablet TAKE 1 TABLET BY MOUTH  DAILY FOR PROSTATE 90 tablet 3   furosemide (LASIX) 80 MG tablet Take 1 tablet 2 x /day for Fluid Retention / Leg Swelling 180 tablet 3   gabapentin (NEURONTIN) 300 MG capsule Take  1 to 2 capsules  1 hour before Bedtime  for Diabetic Neuropathy pains /Patient knows to take by mouth (Patient taking differently: Take  1 to 2 capsules  1 hour before Bedtime  for Diabetic Neuropathy pains /Patient knows to take by mouth) 180 capsule 3   glipiZIDE (GLUCOTROL) 5 MG tablet Take 1/2 tablet  2 x /day  with Meals  for Diabetes (Patient taking differently: Take 1/2 tablet  2 x /day  with Meals  for Diabetes) 90 tablet 3   glucose blood (ONETOUCH ULTRA) test strip CHECK BLOOD SUGAR ONCE A DAY. 100 strip 3   Lancets (ONETOUCH DELICA PLUS UMPNTI14E) MISC CHECK BLOOD SUGAR ONCE A DAY. 100 each 3   Lancets Misc. (ONE TOUCH SURESOFT) MISC Check blood sugar 1 time a day-DX-E11.91 1 each 0   levothyroxine (SYNTHROID) 50 MCG tablet TAKE WHOLE TAB SUN/TUES/THURS, TAKE 1/2 TAB ALL OTHER DAYS. TAKE IN THE MORNING ON AN EMPTY STOMACH WITH ONLY WATER 30 MINUTES BEFORE BREAKFAST AND NO ANTACID MEDS, CALCIUM OR MAGNESIUM FOR 4 HOURS AND AVOID BIOTIN 90 tablet 3   losartan (COZAAR) 25 MG tablet Take 1 tablet (25 mg total) by mouth daily. 90 tablet 3   Magnesium 250 MG TABS Take 250 mg by mouth daily.      metFORMIN (GLUCOPHAGE-XR) 500 MG 24 hr tablet TAKE 2 TABLETS BY MOUTH 2  TIMES DAILY WITH MEALS FOR  DIABETES 360 tablet 3   metoprolol succinate (TOPROL XL) 25 MG 24 hr tablet Take 1 tablet (25 mg total) by mouth at bedtime. 90 tablet 3    Multiple Vitamin (MULTIVITAMIN) capsule Take 1 capsule by mouth daily.     sodium chloride (OCEAN) 0.65 % nasal spray Place 2 sprays into the nose as needed for congestion. 30 mL 3   triamcinolone ointment (KENALOG) 0.1 % Apply topically 2 (two) times daily. Apply to rash 2 to 3 x /day 80 g 1   trolamine salicylate (ASPERCREME) 10 % cream Apply 1 application topically daily as needed for muscle pain.     vitamin C (ASCORBIC ACID) 500 MG tablet Take 500 mg by mouth daily.  No current facility-administered medications for this visit.    Allergies:   Penicillins   Social History:  The patient  reports that he has never smoked. He has never used smokeless tobacco. He reports that he does not drink alcohol and does not use drugs.   Family History:  The patient's family history includes Colon cancer in his father; Diabetes in his father; Sickle cell anemia in his mother; Stomach cancer in his father.  ROS:  Please see the history of present illness.    All other systems are reviewed and otherwise negative.   PHYSICAL EXAM:  VS:  There were no vitals taken for this visit. BMI: There is no height or weight on file to calculate BMI. Well nourished, well developed, in no acute distress  HEENT: normocephalic, atraumatic  Neck: no JVD, carotid bruits or masses Cardiac:  RRR; no significant murmurs, no rubs, or gallops Lungs:  CTA b/l, no wheezing, rhonchi or rales  Abd: soft, nontender MS: no deformity,  age appropriate atrophy Ext: 2+ edema to just below the knees, wearing support stockings  Skin: warm and dry, no rash Neuro:  No gross deficits appreciated Psych: euthymic mood, full affect   EKG:  Not done today    08/17/21: stress myoview   Findings are consistent with no prior ischemia. The study is high risk.   No ST deviation was noted.   Left ventricular function is abnormal. Global function is severely reduced. End diastolic cavity size is severely enlarged. End systolic cavity  size is severely enlarged.   Prior study not available for comparison.   Abnormal, high risk stress nuclear study with apical thinning but no ischemia or infarction.  Gated ejection fraction 27% with global hypokinesis.  Severe left ventricular enlargement.  Study interpreted as high risk due to reduced LV function.   08/17/2021: TTE IMPRESSIONS   1. Left ventricular ejection fraction, by estimation, is 25 to 30%. The  left ventricle has severely decreased function. The left ventricle  demonstrates global hypokinesis. There is mild concentric left ventricular  hypertrophy. Left ventricular diastolic   parameters are consistent with Grade I diastolic dysfunction (impaired  relaxation). The average left ventricular global longitudinal strain is  -10.5 %. The global longitudinal strain is abnormal.   2. Right ventricular systolic function is normal. The right ventricular  size is moderately enlarged. There is severely elevated pulmonary artery  systolic pressure. The estimated right ventricular systolic pressure is  83.4 mmHg.   3. Right atrial size was mildly dilated.   4. The mitral valve is normal in structure. Moderate mitral valve  regurgitation. No evidence of mitral stenosis.   5. Tricuspid valve regurgitation is moderate.   6. The aortic valve is tricuspid. Aortic valve regurgitation is moderate.  No aortic stenosis is present.   7. Pulmonic valve regurgitation is moderate to severe.   8. The inferior vena cava is dilated in size with <50% respiratory  variability, suggesting right atrial pressure of 15 mmHg.   Comparison(s): A prior study was performed on 07/26/2017. Changes from prior  study are noted. Significant changes have occured.    07/28/2017 TEE Study Conclusions - Left ventricle: Wall thickness was increased in a pattern of   moderate LVH. Systolic function was normal. The estimated   ejection fraction was in the range of 55% to 60%. Wall motion was   normal; there  were no regional wall motion abnormalities. - Aortic valve: There was mild regurgitation. - Mitral valve: There was mild  regurgitation. - Left atrium: No evidence of thrombus in the atrial cavity or   appendage. - Right atrium: No evidence of thrombus in the atrial cavity or   appendage. - Atrial septum: The interatrial septum was hypermobile. - Tricuspid valve: There was mild regurgitation. - Pulmonic valve: There was mild regurgitation, with multiple jets. - Impressions: No cardiac source of emboli was indentified.   Impressions: - No cardiac source of emboli was indentified.   07/26/2017: TTE Study Conclusions - Left ventricle: The cavity size was normal. Wall thickness was   increased in a pattern of mild LVH. Systolic function was normal.   The estimated ejection fraction was in the range of 55% to 60%.   Wall motion was normal; there were no regional wall motion   abnormalities. Features are consistent with a pseudonormal left   ventricular filling pattern, with concomitant abnormal relaxation   and increased filling pressure (grade 2 diastolic dysfunction). - Aortic valve: There was mild regurgitation. - Mitral valve: There was mild regurgitation. - Right ventricle: The cavity size was mildly dilated. - Pulmonary arteries: Systolic pressure was mildly increased. PA   peak pressure: 39 mm Hg (S).   Impressions: - Normal LV systolic function; moderate diastolic dysfunction; mild   LVH; mild AI; mild MR; mild RVE; mild TR with mildly elevated   pulmonary pressure.    Recent Labs: 07/23/2021: Brain Natriuretic Peptide 762 09/10/2021: ALT 52; BUN 25; Creat 1.25; Hemoglobin 11.5; Magnesium 1.9; Platelets 161; Potassium 4.8; Sodium 142; TSH 0.69  09/10/2021: Cholesterol 105; HDL 49; LDL Cholesterol (Calc) 44; Total CHOL/HDL Ratio 2.1; Triglycerides 41   Estimated Creatinine Clearance: 50.2 mL/min (A) (by C-G formula based on SCr of 1.25 mg/dL (H)).   Wt Readings from Last 3  Encounters:  09/10/21 182 lb 6.4 oz (82.7 kg)  08/19/21 174 lb (78.9 kg)  08/17/21 179 lb (81.2 kg)     Other studies reviewed: Additional studies/records reviewed today include: summarized above  ASSESSMENT AND PLAN:  1. Paroxysmal Afib     CHA2DS2Vasc is 6, on Eliquis, appropriately dosed     No symptoms   2. HTN     historically w/relative hypotension     Tolerating BB/ARB and aggressive diuresis      5. New NICM 6. New Acute CHF     He remains quite edematous, continue current lasix since he is having success with it     BMET today     Sees HF team next week in consult   Disposition: follow with HF team, back in with Dr. Caryl Comes as scheduled  Current medicines are reviewed at length with the patient today.  The patient did not have any concerns regarding medicines.  Venetia Night, PA-C 09/19/2021 2:10 PM     [place] [address] [place] [address] [phone] (office)  [phone] (fax)

## 2021-09-20 ENCOUNTER — Other Ambulatory Visit: Payer: Self-pay

## 2021-09-20 DIAGNOSIS — E1141 Type 2 diabetes mellitus with diabetic mononeuropathy: Secondary | ICD-10-CM | POA: Diagnosis not present

## 2021-09-20 DIAGNOSIS — I69393 Ataxia following cerebral infarction: Secondary | ICD-10-CM | POA: Diagnosis not present

## 2021-09-20 DIAGNOSIS — N182 Chronic kidney disease, stage 2 (mild): Secondary | ICD-10-CM | POA: Diagnosis not present

## 2021-09-20 DIAGNOSIS — E1122 Type 2 diabetes mellitus with diabetic chronic kidney disease: Secondary | ICD-10-CM | POA: Diagnosis not present

## 2021-09-20 DIAGNOSIS — I69351 Hemiplegia and hemiparesis following cerebral infarction affecting right dominant side: Secondary | ICD-10-CM | POA: Diagnosis not present

## 2021-09-20 DIAGNOSIS — I480 Paroxysmal atrial fibrillation: Secondary | ICD-10-CM | POA: Diagnosis not present

## 2021-09-20 MED ORDER — METOPROLOL SUCCINATE ER 25 MG PO TB24: 25.0000 mg | ORAL_TABLET | Freq: Every day | ORAL | 3 refills | Status: DC

## 2021-09-22 ENCOUNTER — Ambulatory Visit (INDEPENDENT_AMBULATORY_CARE_PROVIDER_SITE_OTHER): Payer: Medicare Other | Admitting: Physician Assistant

## 2021-09-22 ENCOUNTER — Other Ambulatory Visit: Payer: Self-pay

## 2021-09-22 ENCOUNTER — Encounter: Payer: Self-pay | Admitting: Physician Assistant

## 2021-09-22 VITALS — BP 108/60 | HR 76 | Ht 71.75 in | Wt 165.0 lb

## 2021-09-22 DIAGNOSIS — I480 Paroxysmal atrial fibrillation: Secondary | ICD-10-CM | POA: Diagnosis not present

## 2021-09-22 DIAGNOSIS — Z79899 Other long term (current) drug therapy: Secondary | ICD-10-CM | POA: Diagnosis not present

## 2021-09-22 DIAGNOSIS — I428 Other cardiomyopathies: Secondary | ICD-10-CM | POA: Diagnosis not present

## 2021-09-22 DIAGNOSIS — I5021 Acute systolic (congestive) heart failure: Secondary | ICD-10-CM

## 2021-09-22 NOTE — Patient Instructions (Signed)
Medication Instructions:   Your physician recommends that you continue on your current medications as directed. Please refer to the Current Medication list given to you today.  *If you need a refill on your cardiac medications before your next appointment, please call your pharmacy*   Lab Work:  BMET  TODAY   If you have labs (blood work) drawn today and your tests are completely normal, you will receive your results only by: [place] (if you have MyChart) OR A paper copy in the mail If you have any lab test that is abnormal or we need to change your treatment, we will call you to review the results.   Testing/Procedures: NONE ORDERED  TODAY     Follow-Up: At [facility], you and your health needs are our priority.  As part of our continuing mission to provide you with exceptional heart care, we have created designated Provider Care Teams.  These Care Teams include your primary Cardiologist (physician) and Advanced Practice Providers (APPs -  Physician Assistants and Nurse Practitioners) who all work together to provide you with the care you need, when you need it.  We recommend signing up for the patient portal called "MyChart".  Sign up information is provided on this After Visit Summary.  MyChart is used to connect with patients for Virtual Visits (Telemedicine).  Patients are able to view lab/test results, encounter notes, upcoming appointments, etc.  Non-urgent messages can be sent to your provider as well.   To learn more about what you can do with MyChart, go to [email or web address].    Your next appointment:  AS SCHEDULED   The format for your next appointment:   In Person  Provider:   [name], MD   Other Instructions

## 2021-09-23 LAB — BASIC METABOLIC PANEL
BUN/Creatinine Ratio: 18 (ref 10–24)
BUN: 21 mg/dL (ref 8–27)
CO2: 25 mmol/L (ref 20–29)
Calcium: 9.5 mg/dL (ref 8.6–10.2)
Chloride: 99 mmol/L (ref 96–106)
Creatinine, Ser: 1.17 mg/dL (ref 0.76–1.27)
Glucose: 108 mg/dL — ABNORMAL HIGH (ref 70–99)
Potassium: 4 mmol/L (ref 3.5–5.2)
Sodium: 142 mmol/L (ref 134–144)
eGFR: 63 mL/min/{1.73_m2} (ref >59)

## 2021-09-24 ENCOUNTER — Other Ambulatory Visit: Payer: Self-pay | Admitting: *Deleted

## 2021-09-24 MED ORDER — POTASSIUM CHLORIDE ER 10 MEQ PO TBCR: 10.0000 meq | EXTENDED_RELEASE_TABLET | Freq: Every day | ORAL | 0 refills | Status: DC

## 2021-09-27 ENCOUNTER — Other Ambulatory Visit: Payer: Self-pay | Admitting: Adult Health

## 2021-09-27 ENCOUNTER — Other Ambulatory Visit: Payer: Self-pay

## 2021-09-27 DIAGNOSIS — Z1212 Encounter for screening for malignant neoplasm of rectum: Secondary | ICD-10-CM

## 2021-09-27 DIAGNOSIS — Z1211 Encounter for screening for malignant neoplasm of colon: Secondary | ICD-10-CM

## 2021-09-27 LAB — POC HEMOCCULT BLD/STL (HOME/3-CARD/SCREEN)
Card #2 Fecal Occult Blod, POC: NEGATIVE
Card #3 Fecal Occult Blood, POC: NEGATIVE
Fecal Occult Blood, POC: NEGATIVE

## 2021-09-28 DIAGNOSIS — Z1211 Encounter for screening for malignant neoplasm of colon: Secondary | ICD-10-CM | POA: Diagnosis not present

## 2021-09-28 DIAGNOSIS — Z1212 Encounter for screening for malignant neoplasm of rectum: Secondary | ICD-10-CM | POA: Diagnosis not present

## 2021-09-29 ENCOUNTER — Other Ambulatory Visit (HOSPITAL_COMMUNITY): Payer: Self-pay

## 2021-09-29 ENCOUNTER — Ambulatory Visit (HOSPITAL_COMMUNITY)
Admission: RE | Admit: 2021-09-29 | Discharge: 2021-09-29 | Disposition: A | Discharge status: Home / Self Care | Payer: Medicare Other | Source: Ambulatory Visit | Attending: Cardiology | Admitting: Cardiology

## 2021-09-29 ENCOUNTER — Encounter (HOSPITAL_COMMUNITY): Payer: Self-pay | Admitting: Cardiology

## 2021-09-29 VITALS — BP 100/68 | HR 99 | Wt 163.2 lb

## 2021-09-29 DIAGNOSIS — E785 Hyperlipidemia, unspecified: Secondary | ICD-10-CM | POA: Diagnosis not present

## 2021-09-29 DIAGNOSIS — E119 Type 2 diabetes mellitus without complications: Secondary | ICD-10-CM | POA: Diagnosis not present

## 2021-09-29 DIAGNOSIS — Z7984 Long term (current) use of oral hypoglycemic drugs: Secondary | ICD-10-CM | POA: Diagnosis not present

## 2021-09-29 DIAGNOSIS — I480 Paroxysmal atrial fibrillation: Secondary | ICD-10-CM | POA: Insufficient documentation

## 2021-09-29 DIAGNOSIS — Z79899 Other long term (current) drug therapy: Secondary | ICD-10-CM | POA: Insufficient documentation

## 2021-09-29 DIAGNOSIS — I110 Hypertensive heart disease with heart failure: Secondary | ICD-10-CM | POA: Diagnosis not present

## 2021-09-29 DIAGNOSIS — I5032 Chronic diastolic (congestive) heart failure: Secondary | ICD-10-CM

## 2021-09-29 DIAGNOSIS — I083 Combined rheumatic disorders of mitral, aortic and tricuspid valves: Secondary | ICD-10-CM | POA: Diagnosis not present

## 2021-09-29 DIAGNOSIS — Z8673 Personal history of transient ischemic attack (TIA), and cerebral infarction without residual deficits: Secondary | ICD-10-CM | POA: Diagnosis not present

## 2021-09-29 DIAGNOSIS — Z7901 Long term (current) use of anticoagulants: Secondary | ICD-10-CM | POA: Insufficient documentation

## 2021-09-29 DIAGNOSIS — I5022 Chronic systolic (congestive) heart failure: Secondary | ICD-10-CM | POA: Diagnosis not present

## 2021-09-29 DIAGNOSIS — Z8249 Family history of ischemic heart disease and other diseases of the circulatory system: Secondary | ICD-10-CM | POA: Diagnosis not present

## 2021-09-29 LAB — BASIC METABOLIC PANEL
Anion gap: 10 (ref 5–15)
BUN: 21 mg/dL (ref 8–23)
CO2: 29 mmol/L (ref 22–32)
Calcium: 9.1 mg/dL (ref 8.9–10.3)
Chloride: 100 mmol/L (ref 98–111)
Creatinine, Ser: 1.09 mg/dL (ref 0.61–1.24)
GFR, Estimated: >60 mL/min (ref >60)
Glucose, Bld: 136 mg/dL — ABNORMAL HIGH (ref 70–99)
Potassium: 3.7 mmol/L (ref 3.5–5.1)
Sodium: 139 mmol/L (ref 135–145)

## 2021-09-29 LAB — CBC
HCT: 34.6 % — ABNORMAL LOW (ref 39.0–52.0)
Hemoglobin: 11.8 g/dL — ABNORMAL LOW (ref 13.0–17.0)
MCH: 29.9 pg (ref 26.0–34.0)
MCHC: 34.1 g/dL (ref 30.0–36.0)
MCV: 87.6 fL (ref 80.0–100.0)
Platelets: 218 10*3/uL (ref 150–400)
RBC: 3.95 MIL/uL — ABNORMAL LOW (ref 4.22–5.81)
RDW: 14 % (ref 11.5–15.5)
WBC: 5.5 10*3/uL (ref 4.0–10.5)
nRBC: 0 % (ref 0.0–0.2)

## 2021-09-29 LAB — BRAIN NATRIURETIC PEPTIDE: B Natriuretic Peptide: 472 pg/mL — ABNORMAL HIGH (ref 0.0–100.0)

## 2021-09-29 MED ORDER — DAPAGLIFLOZIN PROPANEDIOL 10 MG PO TABS: 10.0000 mg | ORAL_TABLET | Freq: Every day | ORAL | 11 refills | Status: DC

## 2021-09-29 MED ORDER — FUROSEMIDE 80 MG PO TABS: ORAL_TABLET | ORAL | 11 refills | Status: DC

## 2021-09-29 MED ORDER — ENTRESTO 24-26 MG PO TABS: 1.0000 | ORAL_TABLET | Freq: Two times a day (BID) | ORAL | 3 refills | Status: DC

## 2021-09-29 NOTE — Patient Instructions (Signed)
EKG done today.  Labs done today. We will contact you only if your labs are abnormal.  STOP taking Losartan  START Entresto 24-26mg  (1 tablet) by mouth 2 times daily.  START Farxiga 10mg  (1 tablet) by mouth daily.   DECREASE Lasix to 80mg  (1 tablet) by mouth every morning and 40mg  (1/2 tablet) by mouth every evening.   No other medication changes were made. Please continue all current medications as prescribed.  Your physician recommends that you schedule a follow-up appointment in: 10 days for a lab only appointment, 3 weeks with our Clinic Pharmacist and in 6 weeks with Dr. Aundra Dubin  If you have any questions or concerns before your next appointment please send [place] a message through [facility] or call our office at [phone].    TO LEAVE A MESSAGE FOR THE NURSE SELECT OPTION 2, PLEASE LEAVE A MESSAGE INCLUDING: YOUR NAME DATE OF BIRTH CALL BACK NUMBER REASON FOR CALL**this is important as we prioritize the call backs  YOU WILL RECEIVE A CALL BACK THE SAME DAY AS LONG AS YOU CALL BEFORE 4:00 PM   Do the following things EVERYDAY: Weigh yourself in the morning before breakfast. Write it down and keep it in a log. Take your medicines as prescribed Eat low salt foods--Limit salt (sodium) to 2000 mg per day.  Stay as active as you can everyday Limit all fluids for the day to less than 2 liters   At the [facility], you and your health needs are our priority. As part of our continuing mission to provide you with exceptional heart care, we have created designated Provider Care Teams. These Care Teams include your primary Cardiologist (physician) and Advanced Practice Providers (APPs- Physician Assistants and Nurse Practitioners) who all work together to provide you with the care you need, when you need it.   You may see any of the following providers on your designated Care Team at your next follow up: Dr Glori Bickers Dr Haynes Kerns, NP Lyda Jester, [place] Audry Riles, PharmD   Please be sure to bring in all your medications bottles to every appointment.   You are scheduled for a Cardiac Catheterization on Thursday, November 17 with Dr. Loralie Champagne.  1. Please arrive at the [facility] (Main Entrance A) at [facility]: [address] [place], [place] [address] at 8:00 AM (This time is two hours before your procedure to ensure your preparation). Free valet parking service is available.   Special note: Every effort is made to have your procedure done on time. Please understand that emergencies sometimes delay scheduled procedures.  2. Diet: Do not eat solid foods after midnight.  The patient may have clear liquids until 5am upon the day of the procedure.   3. Medication instructions in preparation for your procedure:  DO NOT take your Eliquis the day before your procedure and the day of your procedure  Do not take Diabetes Med Glucophage (Metformin) on the day of the procedure and HOLD 48 HOURS AFTER THE PROCEDURE.  On the morning of your procedure, DO NOT take your Lasix and any morning medicines NOT listed above.  You may use sips of water.  5. Plan for one night stay--bring personal belongings. 6. Bring a current list of your medications and current insurance cards. 7. You MUST have a responsible person to drive you home. 8. Someone MUST be with you the first 24 hours after you arrive home or your discharge will be delayed.  9. Please wear clothes that are easy to get on and off and wear slip-on shoes.  Thank you for allowing [place] to care for you!   -- [facility]

## 2021-09-29 NOTE — Progress Notes (Addendum)
Medication Samples have been provided to the patient.  Drug name: [name]       Strength: 10mg         Qty: 4  LOT: [id number]  Exp.Date: 03/20/2024  Dosing instructions: take 1 tab po qd  The patient has been instructed regarding the correct time, dose, and frequency of taking this medication, including desired effects and most common side effects.   Enaya Howze R Jillyan Plitt 9:48 PM 09/29/2021   Medication Samples have been provided to the patient.  Drug name: [name]       Strength: 24-26mg         Qty: 2  LOT: [id number]  Exp.Date: 12/2023  Dosing instructions: take 1 tab po bid  The patient has been instructed regarding the correct time, dose, and frequency of taking this medication, including desired effects and most common side effects.   Hareem Surowiec R Zacharias Ridling 0:74 PM 09/29/2021   Medication Samples have been provided to the patient.  Drug name: Eliquis       Strength: 5mg         Qty: 4  LOT: [id number]  Exp.Date: 08/2023/07/2023  Dosing instructions: 1 tab po bid  The patient has been instructed regarding the correct time, dose, and frequency of taking this medication, including desired effects and most common side effects.   Aggie Douse R Demya Scruggs 3:70 PM 09/29/2021

## 2021-09-30 ENCOUNTER — Telehealth: Payer: Self-pay | Admitting: Internal Medicine

## 2021-09-30 DIAGNOSIS — I69393 Ataxia following cerebral infarction: Secondary | ICD-10-CM | POA: Diagnosis not present

## 2021-09-30 DIAGNOSIS — I69351 Hemiplegia and hemiparesis following cerebral infarction affecting right dominant side: Secondary | ICD-10-CM | POA: Diagnosis not present

## 2021-09-30 DIAGNOSIS — E1122 Type 2 diabetes mellitus with diabetic chronic kidney disease: Secondary | ICD-10-CM | POA: Diagnosis not present

## 2021-09-30 DIAGNOSIS — N182 Chronic kidney disease, stage 2 (mild): Secondary | ICD-10-CM | POA: Diagnosis not present

## 2021-09-30 DIAGNOSIS — I480 Paroxysmal atrial fibrillation: Secondary | ICD-10-CM | POA: Diagnosis not present

## 2021-09-30 DIAGNOSIS — E1141 Type 2 diabetes mellitus with diabetic mononeuropathy: Secondary | ICD-10-CM | POA: Diagnosis not present

## 2021-09-30 NOTE — H&P (View-Only) (Signed)
PCP: [name], MD Cardiology: Dr. Caryl Comes HF Cardiology: Dr. Aundra Dubin  80 y.o. with history of type 2 DM, HTN, CVA, paroxysmal atrial fibrillation, and chronic systolic CHF was referred by Dr. Caryl Comes for evaluation of CHF.  Patient is on Eliquis now for PAF, he had a CVA in 2018.  Echo at that time showed EF 55-60%.  In the summer of 2022, he developed progressive exertional dyspnea. He was seen in the ER and was noted to have mildly elevate HS-TnI without trend, thought to be due to volume overload from CHF.  Echo was done in 9/22 showing EF 25-30%, mild LVH, moderate RV enlargement with normal RV systolic function, PASP 65 mmHg, moderate TR, moderate AI, moderate MR, IVC dilated.  Cardiolite in 9/22 showed EF 27%, no ischemia.  Patient was started on Lasix due to volume overload and it was titrated up.  Patient does not drink ETOH, smoke, or use drugs.  He has not family history of heart disease.    Currently, he is doing better than in 9/22.  He feels like Lasix helps, peripheral edema has resolved.  He is short of breath with heavy exertion.  No chest pain.  No orthopnea/PND.  No lightheadedness.  He can walk on flat ground without dyspnea.  No palpitations.   Labs (10/22): LDL 44 Labs (11/22): K 4, creatinine 1.17  ECG (personally reviewed): NSR, nonspecific T wave flattening  PMH: 1. Type 2 diabetes 2. HTN 3. Hyperlipidemia 4. Atrial fibrillation: Paroxysmal 5. 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  6. GERD 7. Hypothyroidism 8. Chronic systolic CHF: Echo (7/37) with EF 55-60%.  - Echo (9/22): EF 25-30%, mild LVH, moderate RV enlargement with normal RV systolic function, PASP 65 mmHg, moderate TR, moderate AI, moderate MR, IVC dilated.  - Cardiolite (9/22): EF 27%, no ischemia.   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[employer], occupational: RETIRED  Tobacco Use   Smoking status: Never   Smokeless tobacco: Never  Vaping Use   Vaping Use: Never used  Substance and Sexual Activity   Alcohol use: No   Drug use: No   Sexual activity: Not on file  Other Topics Concern   Not on file  Social History Narrative   Not on file   Social Determinants of Health   Financial Resource Strain: Not on file  Food Insecurity: Not on file  Transportation Needs: Not on file  Physical Activity: Not on file  Stress: Not on file  Social Connections: Not on file  Intimate Partner Violence: Not on file   Family History  Problem Relation Age of Onset   Sickle cell anemia Mother    Diabetes Father    Stomach cancer Father    Colon cancer Father    CAD Neg Hx    Esophageal cancer Neg Hx    Rectal cancer Neg Hx    ROS: All systems reviewed and negative except as per HPI.   Current Outpatient Medications  Medication Sig Dispense Refill   atorvastatin (LIPITOR) 40 MG tablet TAKE 1 TABLET BY MOUTH  DAILY FOR CHOLESTEROL 90 tablet 3   Blood Glucose Monitoring Suppl (ONE TOUCH ULTRA 2) w/Device KIT Check blood sugar 1 time a day-DX-E11.9 1 kit 0  Cholecalciferol (VITAMIN D PO) Take 1 tablet by mouth daily.      Cyanocobalamin (B-12) 50 MCG TABS Take 50 mcg by mouth daily.     dapagliflozin propanediol (FARXIGA) 10 MG TABS tablet Take 1 tablet (10 mg total) by mouth daily before breakfast. 30 tablet 11   DULoxetine (CYMBALTA) 20 MG capsule Take 1 cap daily for mood and pain. 90 capsule 0   ELIQUIS 5 MG TABS tablet TAKE 1 TABLET BY MOUTH  TWICE DAILY TO PREVENT  BLOOD CLOTS 180 tablet 3   finasteride (PROSCAR) 5 MG tablet TAKE 1 TABLET BY MOUTH  DAILY FOR PROSTATE 90 tablet 3   gabapentin (NEURONTIN) 300 MG capsule Take  1 to 2 capsules  1 hour before Bedtime  for Diabetic Neuropathy pains /Patient knows to take by mouth (Patient taking differently: Take  1  to 2 capsules  2 hour before Bedtime  for Diabetic Neuropathy pains /Patient knows to take by mouth) 180 capsule 3   glipiZIDE (GLUCOTROL) 5 MG tablet Take 1/2 tablet  2 x /day  with Meals  for Diabetes (Patient taking differently: Take 1/2 tablet  2 x /day  with Meals  for Diabetes) 90 tablet 3   glucose blood (ONETOUCH ULTRA) test strip CHECK BLOOD SUGAR ONCE A DAY. 100 strip 3   Lancets (ONETOUCH DELICA PLUS [id number]) MISC CHECK BLOOD SUGAR ONCE A DAY. 100 each 3   Lancets Misc. (ONE TOUCH SURESOFT) MISC Check blood sugar 1 time a day-DX-E11.91 1 each 0   levothyroxine (SYNTHROID) 50 MCG tablet TAKE WHOLE TAB SUN/TUES/THURS, TAKE 1/2 TAB ALL OTHER DAYS. TAKE IN THE MORNING ON AN EMPTY STOMACH WITH ONLY WATER 30 MINUTES BEFORE BREAKFAST AND NO ANTACID MEDS, CALCIUM OR MAGNESIUM FOR 4 HOURS AND AVOID BIOTIN 90 tablet 3   Magnesium 250 MG TABS Take 250 mg by mouth daily.      metFORMIN (GLUCOPHAGE-XR) 500 MG 24 hr tablet TAKE 2 TABLETS BY MOUTH 2  TIMES DAILY WITH MEALS FOR  DIABETES 360 tablet 3   metoprolol succinate (TOPROL XL) 25 MG 24 hr tablet Take 1 tablet (25 mg total) by mouth at bedtime. 90 tablet 3   Multiple Vitamin (MULTIVITAMIN) capsule Take 1 capsule by mouth daily.     potassium chloride (KLOR-CON) 10 MEQ tablet Take 1 tablet (10 mEq total) by mouth daily. 30 tablet 0   sacubitril-valsartan (ENTRESTO) 24-26 MG Take 1 tablet by mouth 2 (two) times daily. 180 tablet 3   triamcinolone ointment (KENALOG) 0.1 % Apply topically 2 (two) times daily. Apply to rash 2 to 3 x /day 80 g 1   trolamine salicylate (ASPERCREME) 10 % cream Apply 1 application topically daily as needed for muscle pain.     vitamin C (ASCORBIC ACID) 500 MG tablet Take 500 mg by mouth daily.     furosemide (LASIX) 80 MG tablet Take 1 tablet (80 mg total) by mouth every morning AND 0.5 tablets (40 mg total) every evening. 45 tablet 11   No current facility-administered medications for this encounter.   BP 100/68    Pulse 99   Wt 74 kg (163 lb 3.2 oz)   SpO2 (!) 83%   BMI 22.29 kg/m  General: NAD Neck: No JVD, no thyromegaly or thyroid nodule.  Lungs: Clear to auscultation bilaterally with normal respiratory effort. CV: Nondisplaced PMI.  Heart regular S1/S2, no S3/S4, no murmur.  1+ edema to knees.  No carotid bruit.  Normal pedal pulses.  Abdomen: Soft, nontender,  no hepatosplenomegaly, no distention.  Skin: Intact without lesions or rashes.  Neurologic: Alert and oriented x 3.  Psych: Normal affect. Extremities: No clubbing or cyanosis.  HEENT: Normal.   Assessment/Plan: 1. Chronic systolic CHF:  Echo in 0/34 with EF 25-30%, mild LVH, moderate RV enlargement with normal RV systolic function, PASP 65 mmHg, moderate TR, moderate AI, moderate MR, IVC dilated.  Cause of cardiomyopathy is uncertain.  No history of drugs/ETOH.  No family history of cardiomyopathy.  He does have risk factors for CAD (diabetes, HTN) though no chest pain.  On exam, he is not significantly volume overloaded.  NYHA class II symptoms.  - D/c losartan, start Entresto 24/26 bid.  BMET today and in 10 days.  - Decrease Lasix to 80 qam/40 qpm.   - Add Farxiga 10 mg daily.  - Continue Toprol XL 25 mg daily.  - Narrow QRS, not candidate for CRT.  With advanced age, probably will not aim for ICD.  - I will arrange for left/right heart catheterization, need to rule out coronary artery disease as cause of cardiomyopathy.  We discussed risks/benefits and he agrees to procedure. Hold Eliquis day before and day of.  2. Atrial fibrillation: Paroxysmal.  He is in NSR today. History of CVA.  - Continue Eliquis.  3. Hyperlipidemia: Good lipids in 10/22.  - Continue atorvastatin .   Followup with HF pharmacist in 3 wks for med titration.  See me in 6 wks.   Loralie Champagne 09/30/2021

## 2021-09-30 NOTE — Progress Notes (Signed)
PCP: [name], MD Cardiology: Dr. Caryl Comes HF Cardiology: Dr. Aundra Dubin  80 y.o. with history of type 2 DM, HTN, CVA, paroxysmal atrial fibrillation, and chronic systolic CHF was referred by Dr. Caryl Comes for evaluation of CHF.  Patient is on Eliquis now for PAF, he had a CVA in 2018.  Echo at that time showed EF 55-60%.  In the summer of 2022, he developed progressive exertional dyspnea. He was seen in the ER and was noted to have mildly elevate HS-TnI without trend, thought to be due to volume overload from CHF.  Echo was done in 9/22 showing EF 25-30%, mild LVH, moderate RV enlargement with normal RV systolic function, PASP 65 mmHg, moderate TR, moderate AI, moderate MR, IVC dilated.  Cardiolite in 9/22 showed EF 27%, no ischemia.  Patient was started on Lasix due to volume overload and it was titrated up.  Patient does not drink ETOH, smoke, or use drugs.  He has not family history of heart disease.    Currently, he is doing better than in 9/22.  He feels like Lasix helps, peripheral edema has resolved.  He is short of breath with heavy exertion.  No chest pain.  No orthopnea/PND.  No lightheadedness.  He can walk on flat ground without dyspnea.  No palpitations.   Labs (10/22): LDL 44 Labs (11/22): K 4, creatinine 1.17  ECG (personally reviewed): NSR, nonspecific T wave flattening  PMH: 1. Type 2 diabetes 2. HTN 3. Hyperlipidemia 4. Atrial fibrillation: Paroxysmal 5. 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  6. GERD 7. Hypothyroidism 8. Chronic systolic CHF: Echo (7/20) with EF 55-60%.  - Echo (9/22): EF 25-30%, mild LVH, moderate RV enlargement with normal RV systolic function, PASP 65 mmHg, moderate TR, moderate AI, moderate MR, IVC dilated.  - Cardiolite (9/22): EF 27%, no ischemia.   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[employer], occupational: RETIRED  Tobacco Use   Smoking status: Never   Smokeless tobacco: Never  Vaping Use   Vaping Use: Never used  Substance and Sexual Activity   Alcohol use: No   Drug use: No   Sexual activity: Not on file  Other Topics Concern   Not on file  Social History Narrative   Not on file   Social Determinants of Health   Financial Resource Strain: Not on file  Food Insecurity: Not on file  Transportation Needs: Not on file  Physical Activity: Not on file  Stress: Not on file  Social Connections: Not on file  Intimate Partner Violence: Not on file   Family History  Problem Relation Age of Onset   Sickle cell anemia Mother    Diabetes Father    Stomach cancer Father    Colon cancer Father    CAD Neg Hx    Esophageal cancer Neg Hx    Rectal cancer Neg Hx    ROS: All systems reviewed and negative except as per HPI.   Current Outpatient Medications  Medication Sig Dispense Refill   atorvastatin (LIPITOR) 40 MG tablet TAKE 1 TABLET BY MOUTH  DAILY FOR CHOLESTEROL 90 tablet 3   Blood Glucose Monitoring Suppl (ONE TOUCH ULTRA 2) w/Device KIT Check blood sugar 1 time a day-DX-E11.9 1 kit 0  Cholecalciferol (VITAMIN D PO) Take 1 tablet by mouth daily.      Cyanocobalamin (B-12) 50 MCG TABS Take 50 mcg by mouth daily.     dapagliflozin propanediol (FARXIGA) 10 MG TABS tablet Take 1 tablet (10 mg total) by mouth daily before breakfast. 30 tablet 11   DULoxetine (CYMBALTA) 20 MG capsule Take 1 cap daily for mood and pain. 90 capsule 0   ELIQUIS 5 MG TABS tablet TAKE 1 TABLET BY MOUTH  TWICE DAILY TO PREVENT  BLOOD CLOTS 180 tablet 3   finasteride (PROSCAR) 5 MG tablet TAKE 1 TABLET BY MOUTH  DAILY FOR PROSTATE 90 tablet 3   gabapentin (NEURONTIN) 300 MG capsule Take  1 to 2 capsules  1 hour before Bedtime  for Diabetic Neuropathy pains /Patient knows to take by mouth (Patient taking differently: Take  1  to 2 capsules  2 hour before Bedtime  for Diabetic Neuropathy pains /Patient knows to take by mouth) 180 capsule 3   glipiZIDE (GLUCOTROL) 5 MG tablet Take 1/2 tablet  2 x /day  with Meals  for Diabetes (Patient taking differently: Take 1/2 tablet  2 x /day  with Meals  for Diabetes) 90 tablet 3   glucose blood (ONETOUCH ULTRA) test strip CHECK BLOOD SUGAR ONCE A DAY. 100 strip 3   Lancets (ONETOUCH DELICA PLUS [id number]) MISC CHECK BLOOD SUGAR ONCE A DAY. 100 each 3   Lancets Misc. (ONE TOUCH SURESOFT) MISC Check blood sugar 1 time a day-DX-E11.91 1 each 0   levothyroxine (SYNTHROID) 50 MCG tablet TAKE WHOLE TAB SUN/TUES/THURS, TAKE 1/2 TAB ALL OTHER DAYS. TAKE IN THE MORNING ON AN EMPTY STOMACH WITH ONLY WATER 30 MINUTES BEFORE BREAKFAST AND NO ANTACID MEDS, CALCIUM OR MAGNESIUM FOR 4 HOURS AND AVOID BIOTIN 90 tablet 3   Magnesium 250 MG TABS Take 250 mg by mouth daily.      metFORMIN (GLUCOPHAGE-XR) 500 MG 24 hr tablet TAKE 2 TABLETS BY MOUTH 2  TIMES DAILY WITH MEALS FOR  DIABETES 360 tablet 3   metoprolol succinate (TOPROL XL) 25 MG 24 hr tablet Take 1 tablet (25 mg total) by mouth at bedtime. 90 tablet 3   Multiple Vitamin (MULTIVITAMIN) capsule Take 1 capsule by mouth daily.     potassium chloride (KLOR-CON) 10 MEQ tablet Take 1 tablet (10 mEq total) by mouth daily. 30 tablet 0   sacubitril-valsartan (ENTRESTO) 24-26 MG Take 1 tablet by mouth 2 (two) times daily. 180 tablet 3   triamcinolone ointment (KENALOG) 0.1 % Apply topically 2 (two) times daily. Apply to rash 2 to 3 x /day 80 g 1   trolamine salicylate (ASPERCREME) 10 % cream Apply 1 application topically daily as needed for muscle pain.     vitamin C (ASCORBIC ACID) 500 MG tablet Take 500 mg by mouth daily.     furosemide (LASIX) 80 MG tablet Take 1 tablet (80 mg total) by mouth every morning AND 0.5 tablets (40 mg total) every evening. 45 tablet 11   No current facility-administered medications for this encounter.   BP 100/68    Pulse 99   Wt 74 kg (163 lb 3.2 oz)   SpO2 (!) 83%   BMI 22.29 kg/m  General: NAD Neck: No JVD, no thyromegaly or thyroid nodule.  Lungs: Clear to auscultation bilaterally with normal respiratory effort. CV: Nondisplaced PMI.  Heart regular S1/S2, no S3/S4, no murmur.  1+ edema to knees.  No carotid bruit.  Normal pedal pulses.  Abdomen: Soft, nontender,  no hepatosplenomegaly, no distention.  Skin: Intact without lesions or rashes.  Neurologic: Alert and oriented x 3.  Psych: Normal affect. Extremities: No clubbing or cyanosis.  HEENT: Normal.   Assessment/Plan: 1. Chronic systolic CHF:  Echo in 6/26 with EF 25-30%, mild LVH, moderate RV enlargement with normal RV systolic function, PASP 65 mmHg, moderate TR, moderate AI, moderate MR, IVC dilated.  Cause of cardiomyopathy is uncertain.  No history of drugs/ETOH.  No family history of cardiomyopathy.  He does have risk factors for CAD (diabetes, HTN) though no chest pain.  On exam, he is not significantly volume overloaded.  NYHA class II symptoms.  - D/c losartan, start Entresto 24/26 bid.  BMET today and in 10 days.  - Decrease Lasix to 80 qam/40 qpm.   - Add Farxiga 10 mg daily.  - Continue Toprol XL 25 mg daily.  - Narrow QRS, not candidate for CRT.  With advanced age, probably will not aim for ICD.  - I will arrange for left/right heart catheterization, need to rule out coronary artery disease as cause of cardiomyopathy.  We discussed risks/benefits and he agrees to procedure. Hold Eliquis day before and day of.  2. Atrial fibrillation: Paroxysmal.  He is in NSR today. History of CVA.  - Continue Eliquis.  3. Hyperlipidemia: Good lipids in 10/22.  - Continue atorvastatin .   Followup with HF pharmacist in 3 wks for med titration.  See me in 6 wks.   Loralie Champagne 09/30/2021

## 2021-09-30 NOTE — Telephone Encounter (Signed)
Attempted to return call to pt's daughter.  Left voicemail message to contact RN at [phone].

## 2021-09-30 NOTE — Telephone Encounter (Signed)
Daughter was returning call, she stated that some one call in regards to upcoming procedure. Please advise

## 2021-10-03 ENCOUNTER — Encounter (HOSPITAL_COMMUNITY): Payer: Self-pay

## 2021-10-04 ENCOUNTER — Telehealth (HOSPITAL_COMMUNITY): Payer: Self-pay | Admitting: Pharmacy Technician

## 2021-10-04 NOTE — Telephone Encounter (Signed)
Spoke with pt's daughter, DPR who states pr received phone call and is uncertain who called or the reason for call.  Advised RN does not see a note re: phone call from this office.  Advised pt is still scheduled for heart cath on 11/17 and to follow instructions previously provided.  Provided phone number of CHF clinic [phone] to pt's daughter for further assistance if needed.  Pt's daughter verbalizes understanding and thanked Therapist, sports.

## 2021-10-04 NOTE — Telephone Encounter (Signed)
Advanced Heart Failure Patient Advocate Encounter  Spoke with patient's daughter regarding Earl [name] and [name] assistance. They took the forms home to collect POI. The patient's daughter will email those back once filled in entirely.  Of note, patient's daughter is aware that we will not seek assistance for Eliquis. Will give samples if needed.  [name], CPhT

## 2021-10-04 NOTE — Telephone Encounter (Signed)
Pt's daughter is returning a call to [facility] from 09/30/21

## 2021-10-06 DIAGNOSIS — I480 Paroxysmal atrial fibrillation: Secondary | ICD-10-CM | POA: Diagnosis not present

## 2021-10-06 DIAGNOSIS — E1122 Type 2 diabetes mellitus with diabetic chronic kidney disease: Secondary | ICD-10-CM | POA: Diagnosis not present

## 2021-10-06 DIAGNOSIS — I69351 Hemiplegia and hemiparesis following cerebral infarction affecting right dominant side: Secondary | ICD-10-CM | POA: Diagnosis not present

## 2021-10-06 DIAGNOSIS — N182 Chronic kidney disease, stage 2 (mild): Secondary | ICD-10-CM | POA: Diagnosis not present

## 2021-10-06 DIAGNOSIS — I69393 Ataxia following cerebral infarction: Secondary | ICD-10-CM | POA: Diagnosis not present

## 2021-10-06 DIAGNOSIS — E1141 Type 2 diabetes mellitus with diabetic mononeuropathy: Secondary | ICD-10-CM | POA: Diagnosis not present

## 2021-10-07 ENCOUNTER — Other Ambulatory Visit: Payer: Self-pay

## 2021-10-07 ENCOUNTER — Encounter (HOSPITAL_COMMUNITY)
Admission: RE | Disposition: A | Discharge status: Home / Self Care | Payer: Self-pay | Source: Ambulatory Visit | Attending: Cardiology

## 2021-10-07 ENCOUNTER — Ambulatory Visit (HOSPITAL_COMMUNITY)
Admission: RE | Admit: 2021-10-07 | Discharge: 2021-10-07 | Disposition: A | Discharge status: Home / Self Care | Payer: Medicare Other | Source: Ambulatory Visit | Attending: Cardiology | Admitting: Cardiology

## 2021-10-07 DIAGNOSIS — Z7901 Long term (current) use of anticoagulants: Secondary | ICD-10-CM | POA: Insufficient documentation

## 2021-10-07 DIAGNOSIS — E785 Hyperlipidemia, unspecified: Secondary | ICD-10-CM | POA: Diagnosis not present

## 2021-10-07 DIAGNOSIS — I480 Paroxysmal atrial fibrillation: Secondary | ICD-10-CM | POA: Insufficient documentation

## 2021-10-07 DIAGNOSIS — I110 Hypertensive heart disease with heart failure: Secondary | ICD-10-CM | POA: Diagnosis not present

## 2021-10-07 DIAGNOSIS — Z8673 Personal history of transient ischemic attack (TIA), and cerebral infarction without residual deficits: Secondary | ICD-10-CM | POA: Insufficient documentation

## 2021-10-07 DIAGNOSIS — E119 Type 2 diabetes mellitus without complications: Secondary | ICD-10-CM | POA: Diagnosis not present

## 2021-10-07 DIAGNOSIS — I509 Heart failure, unspecified: Secondary | ICD-10-CM | POA: Diagnosis not present

## 2021-10-07 DIAGNOSIS — I428 Other cardiomyopathies: Secondary | ICD-10-CM | POA: Diagnosis not present

## 2021-10-07 DIAGNOSIS — I5022 Chronic systolic (congestive) heart failure: Secondary | ICD-10-CM | POA: Insufficient documentation

## 2021-10-07 DIAGNOSIS — I2510 Atherosclerotic heart disease of native coronary artery without angina pectoris: Secondary | ICD-10-CM | POA: Insufficient documentation

## 2021-10-07 HISTORY — PX: RIGHT/LEFT HEART CATH AND CORONARY ANGIOGRAPHY: CATH118266

## 2021-10-07 LAB — POCT I-STAT EG7
Acid-Base Excess: 3 mmol/L — ABNORMAL HIGH (ref 0.0–2.0)
Bicarbonate: 28.1 mmol/L — ABNORMAL HIGH (ref 20.0–28.0)
Calcium, Ion: 1.15 mmol/L (ref 1.15–1.40)
HCT: 32 % — ABNORMAL LOW (ref 39.0–52.0)
Hemoglobin: 10.9 g/dL — ABNORMAL LOW (ref 13.0–17.0)
O2 Saturation: 77 %
Potassium: 3.6 mmol/L (ref 3.5–5.1)
Sodium: 142 mmol/L (ref 135–145)
TCO2: 29 mmol/L (ref 22–32)
pCO2, Ven: 45.7 mmHg (ref 44.0–60.0)
pH, Ven: 7.397 (ref 7.250–7.430)
pO2, Ven: 42 mmHg (ref 32.0–45.0)

## 2021-10-07 LAB — BASIC METABOLIC PANEL
Anion gap: 8 (ref 5–15)
BUN: 24 mg/dL — ABNORMAL HIGH (ref 8–23)
CO2: 27 mmol/L (ref 22–32)
Calcium: 9 mg/dL (ref 8.9–10.3)
Chloride: 104 mmol/L (ref 98–111)
Creatinine, Ser: 1.1 mg/dL (ref 0.61–1.24)
GFR, Estimated: >60 mL/min (ref >60)
Glucose, Bld: 133 mg/dL — ABNORMAL HIGH (ref 70–99)
Potassium: 3.9 mmol/L (ref 3.5–5.1)
Sodium: 139 mmol/L (ref 135–145)

## 2021-10-07 LAB — GLUCOSE, CAPILLARY: Glucose-Capillary: 117 mg/dL — ABNORMAL HIGH (ref 70–99)

## 2021-10-07 SURGERY — RIGHT/LEFT HEART CATH AND CORONARY ANGIOGRAPHY
Anesthesia: LOCAL

## 2021-10-07 MED ORDER — HYDRALAZINE HCL 20 MG/ML IJ SOLN: 10.0000 mg | INTRAMUSCULAR | Status: DC | PRN

## 2021-10-07 MED ORDER — HEPARIN SODIUM (PORCINE) 1000 UNIT/ML IJ SOLN
INTRAMUSCULAR | Status: AC
  Filled 2021-10-07: qty 1

## 2021-10-07 MED ORDER — APIXABAN 5 MG PO TABS: ORAL_TABLET | ORAL | 3 refills | Status: DC

## 2021-10-07 MED ORDER — SODIUM CHLORIDE 0.9 % IV SOLN: 250.0000 mL | INTRAVENOUS | Status: DC | PRN

## 2021-10-07 MED ORDER — LIDOCAINE HCL (PF) 1 % IJ SOLN
INTRAMUSCULAR | Status: AC
  Filled 2021-10-07: qty 30

## 2021-10-07 MED ORDER — MIDAZOLAM HCL 2 MG/2ML IJ SOLN
INTRAMUSCULAR | Status: DC | PRN
  Administered 2021-10-07: 1 mg via INTRAVENOUS

## 2021-10-07 MED ORDER — HEPARIN SODIUM (PORCINE) 1000 UNIT/ML IJ SOLN
INTRAMUSCULAR | Status: DC | PRN
  Administered 2021-10-07: 4000 [IU] via INTRAVENOUS

## 2021-10-07 MED ORDER — IOHEXOL 350 MG/ML SOLN
INTRAVENOUS | Status: DC | PRN
  Administered 2021-10-07: 11:00:00 60 mL

## 2021-10-07 MED ORDER — LIDOCAINE HCL (PF) 1 % IJ SOLN
INTRAMUSCULAR | Status: DC | PRN
  Administered 2021-10-07 (×2): 2 mL via INTRADERMAL

## 2021-10-07 MED ORDER — VERAPAMIL HCL 2.5 MG/ML IV SOLN
INTRAVENOUS | Status: AC
  Filled 2021-10-07: qty 2

## 2021-10-07 MED ORDER — LABETALOL HCL 5 MG/ML IV SOLN: 10.0000 mg | INTRAVENOUS | Status: DC | PRN

## 2021-10-07 MED ORDER — SODIUM CHLORIDE 0.9 % IV SOLN: INTRAVENOUS | Status: DC

## 2021-10-07 MED ORDER — ONDANSETRON HCL 4 MG/2ML IJ SOLN: 4.0000 mg | Freq: Four times a day (QID) | INTRAMUSCULAR | Status: DC | PRN

## 2021-10-07 MED ORDER — SODIUM CHLORIDE 0.9% FLUSH: 3.0000 mL | INTRAVENOUS | Status: DC | PRN

## 2021-10-07 MED ORDER — SODIUM CHLORIDE 0.9% FLUSH: 3.0000 mL | Freq: Two times a day (BID) | INTRAVENOUS | Status: DC

## 2021-10-07 MED ORDER — MIDAZOLAM HCL 2 MG/2ML IJ SOLN
INTRAMUSCULAR | Status: AC
  Filled 2021-10-07: qty 2

## 2021-10-07 MED ORDER — HEPARIN (PORCINE) IN NACL 1000-0.9 UT/500ML-% IV SOLN
INTRAVENOUS | Status: AC
  Filled 2021-10-07: qty 1000

## 2021-10-07 MED ORDER — ASPIRIN 81 MG PO CHEW
81.0000 mg | CHEWABLE_TABLET | ORAL | Status: AC
  Administered 2021-10-07: 09:00:00 81 mg via ORAL
  Filled 2021-10-07: qty 1

## 2021-10-07 MED ORDER — ACETAMINOPHEN 325 MG PO TABS: 650.0000 mg | ORAL_TABLET | ORAL | Status: DC | PRN

## 2021-10-07 MED ORDER — FENTANYL CITRATE (PF) 100 MCG/2ML IJ SOLN
INTRAMUSCULAR | Status: DC | PRN
  Administered 2021-10-07: 25 ug via INTRAVENOUS

## 2021-10-07 MED ORDER — HEPARIN (PORCINE) IN NACL 1000-0.9 UT/500ML-% IV SOLN
INTRAVENOUS | Status: DC | PRN
  Administered 2021-10-07 (×2): 500 mL

## 2021-10-07 MED ORDER — FENTANYL CITRATE (PF) 100 MCG/2ML IJ SOLN
INTRAMUSCULAR | Status: AC
  Filled 2021-10-07: qty 2

## 2021-10-07 MED ORDER — VERAPAMIL HCL 2.5 MG/ML IV SOLN
INTRAVENOUS | Status: DC | PRN
  Administered 2021-10-07: 11:00:00 10 mL via INTRA_ARTERIAL

## 2021-10-07 SURGICAL SUPPLY — 11 items
CATH 5FR JL3.5 JR4 ANG PIG MP (CATHETERS) ×2 IMPLANT
CATH BALLN WEDGE 5F 110CM (CATHETERS) ×2 IMPLANT
DEVICE RAD COMP TR BAND LRG (VASCULAR PRODUCTS) ×2 IMPLANT
GLIDESHEATH SLEND SS 6F .021 (SHEATH) ×2 IMPLANT
GUIDEWIRE INQWIRE 1.5J.035X260 (WIRE) ×1 IMPLANT
INQWIRE 1.5J .035X260CM (WIRE) ×2
KIT HEART LEFT (KITS) ×2 IMPLANT
PACK CARDIAC CATHETERIZATION (CUSTOM PROCEDURE TRAY) ×2 IMPLANT
SHEATH GLIDE SLENDER 4/5FR (SHEATH) ×2 IMPLANT
TRANSDUCER W/STOPCOCK (MISCELLANEOUS) ×2 IMPLANT
TUBING CIL FLEX 10 FLL-RA (TUBING) ×2 IMPLANT

## 2021-10-07 NOTE — Interval H&P Note (Signed)
History and Physical Interval Note:  10/07/2021 10:30 AM  Earl [name]  has presented today for surgery, with the diagnosis of CHF.  The various methods of treatment have been discussed with the patient and family. After consideration of risks, benefits and other options for treatment, the patient has consented to  Procedure(s): RIGHT/LEFT HEART CATH AND CORONARY ANGIOGRAPHY (N/A) as a surgical intervention.  The patient's history has been reviewed, patient examined, no change in status, stable for surgery.  I have reviewed the patient's chart and labs.  Questions were answered to the patient's satisfaction.     Sharese Manrique [employer]

## 2021-10-07 NOTE — Discharge Instructions (Addendum)
Restart Eliquis tomorrow morning.    Drink plenty of fluid for 48 hours and keep wrist elevated at heart level for 24 hours/ HOLD METFORMIN, RESTART SUNDAY  Radial Site Care   This sheet gives you information about how to care for yourself after your procedure. Your health care provider may also give you more specific instructions. If you have problems or questions, contact your health care provider. What can I expect after the procedure? After the procedure, it is common to have: Bruising and tenderness at the catheter insertion area. Follow these instructions at home: Medicines Take over-the-counter and prescription medicines only as told by your health care provider. Insertion site care Follow instructions from your health care provider about how to take care of your insertion site. Make sure you: Wash your hands with soap and water before you change your bandage (dressing). If soap and water are not available, use hand sanitizer. remove your dressing as told by your health care provider. In 24 hours Check your insertion site every day for signs of infection. Check for: Redness, swelling, or pain. Fluid or blood. Pus or a bad smell. Warmth. Do not take baths, swim, or use a hot tub until your health care provider approves. You may shower 24-48 hours after the procedure, or as directed by your health care provider. Remove the dressing and gently wash the site with plain soap and water. Pat the area dry with a clean towel. Do not rub the site. That could cause bleeding. Do not apply powder or lotion to the site. Activity   For 24 hours after the procedure, or as directed by your health care provider: Do not flex or bend the affected arm. Do not push or pull heavy objects with the affected arm. Do not drive yourself home from the hospital or clinic. You may drive 24 hours after the procedure unless your health care provider tells you not to. Do not operate machinery or power  tools. Do not lift anything that is heavier than 10 lb (4.5 kg), or the limit that you are told, until your health care provider says that it is safe. For 4 days Ask your health care provider when it is okay to: Return to work or school. Resume usual physical activities or sports. Resume sexual activity. General instructions If the catheter site starts to bleed, raise your arm and put firm pressure on the site. If the bleeding does not stop, get help right away. This is a medical emergency. If you went home on the same day as your procedure, a responsible adult should be with you for the first 24 hours after you arrive home. Keep all follow-up visits as told by your health care provider. This is important. Contact a health care provider if: You have a fever. You have redness, swelling, or yellow drainage around your insertion site. Get help right away if: You have unusual pain at the radial site. The catheter insertion area swells very fast. The insertion area is bleeding, and the bleeding does not stop when you hold steady pressure on the area. Your arm or hand becomes pale, cool, tingly, or numb. These symptoms may represent a serious problem that is an emergency. Do not wait to see if the symptoms will go away. Get medical help right away. Call your local emergency services (911 in the [place] Do not drive yourself to the hospital. Summary After the procedure, it is common to have bruising and tenderness at the site. Follow instructions from your  health care provider about how to take care of your radial site wound. Check the wound every day for signs of infection. Do not lift anything that is heavier than 10 lb (4.5 kg), or the limit that you are told, until your health care provider says that it is safe. This information is not intended to replace advice given to you by your health care provider. Make sure you discuss any questions you have with your health care provider. Document  Revised: 12/13/2017 Document Reviewed: 12/13/2017 Elsevier Patient Education  2020 [employer].

## 2021-10-08 ENCOUNTER — Encounter (HOSPITAL_COMMUNITY): Payer: Self-pay | Admitting: Cardiology

## 2021-10-08 NOTE — Telephone Encounter (Signed)
Advanced Heart Failure Patient Advocate Encounter   Patient was approved to receive Farxiga from [employer]  Patient ID: [id number] Effective dates: 10/07/21 through 11/20/22

## 2021-10-11 ENCOUNTER — Ambulatory Visit (HOSPITAL_COMMUNITY)
Admission: RE | Admit: 2021-10-11 | Discharge: 2021-10-11 | Disposition: A | Discharge status: Home / Self Care | Payer: Medicare Other | Source: Ambulatory Visit | Attending: Cardiology | Admitting: Cardiology

## 2021-10-11 ENCOUNTER — Other Ambulatory Visit: Payer: Self-pay

## 2021-10-11 DIAGNOSIS — H52203 Unspecified astigmatism, bilateral: Secondary | ICD-10-CM | POA: Diagnosis not present

## 2021-10-11 DIAGNOSIS — H47323 Drusen of optic disc, bilateral: Secondary | ICD-10-CM | POA: Diagnosis not present

## 2021-10-11 DIAGNOSIS — I5032 Chronic diastolic (congestive) heart failure: Secondary | ICD-10-CM | POA: Diagnosis not present

## 2021-10-11 DIAGNOSIS — H04122 Dry eye syndrome of left lacrimal gland: Secondary | ICD-10-CM | POA: Diagnosis not present

## 2021-10-11 DIAGNOSIS — E119 Type 2 diabetes mellitus without complications: Secondary | ICD-10-CM | POA: Diagnosis not present

## 2021-10-11 LAB — BASIC METABOLIC PANEL
Anion gap: 10 (ref 5–15)
BUN: 30 mg/dL — ABNORMAL HIGH (ref 8–23)
CO2: 28 mmol/L (ref 22–32)
Calcium: 9.2 mg/dL (ref 8.9–10.3)
Chloride: 102 mmol/L (ref 98–111)
Creatinine, Ser: 1.75 mg/dL — ABNORMAL HIGH (ref 0.61–1.24)
GFR, Estimated: 39 mL/min — ABNORMAL LOW (ref >60)
Glucose, Bld: 125 mg/dL — ABNORMAL HIGH (ref 70–99)
Potassium: 4.6 mmol/L (ref 3.5–5.1)
Sodium: 140 mmol/L (ref 135–145)

## 2021-10-11 LAB — HM DIABETES EYE EXAM

## 2021-10-11 NOTE — Telephone Encounter (Signed)
Philicia Branch,CMA aware of message and will follow up with pt.

## 2021-10-12 LAB — POCT I-STAT EG7
Acid-Base Excess: 2 mmol/L (ref 0.0–2.0)
Bicarbonate: 28.1 mmol/L — ABNORMAL HIGH (ref 20.0–28.0)
Calcium, Ion: 1.21 mmol/L (ref 1.15–1.40)
HCT: 33 % — ABNORMAL LOW (ref 39.0–52.0)
Hemoglobin: 11.2 g/dL — ABNORMAL LOW (ref 13.0–17.0)
O2 Saturation: 75 %
Potassium: 3.7 mmol/L (ref 3.5–5.1)
Sodium: 141 mmol/L (ref 135–145)
TCO2: 30 mmol/L (ref 22–32)
pCO2, Ven: 47.3 mmHg (ref 44.0–60.0)
pH, Ven: 7.382 (ref 7.250–7.430)
pO2, Ven: 41 mmHg (ref 32.0–45.0)

## 2021-10-12 NOTE — Progress Notes (Signed)
PCP: [name], MD Cardiology: Dr. Caryl Comes HF Cardiology: Dr. Aundra Dubin  HPI:  80 y.o. with history of type 2 DM, HTN, CVA, paroxysmal atrial fibrillation, and chronic systolic CHF was referred by Dr. Caryl Comes for evaluation of CHF.  Patient is on Eliquis now for PAF, he had a CVA in 2018.  Echo at that time showed EF 55-60%.  In the summer of 2022, he developed progressive exertional dyspnea. He was seen in the ER and was noted to have mildly elevated HS-TnI without trend, thought to be due to volume overload from CHF.  Echo was done in 07/2021 showing EF 25-30%, mild LVH, moderate RV enlargement with normal RV systolic function, PASP 65 mmHg, moderate TR, moderate AI, moderate MR, IVC dilated.  Cardiolite in 07/2021 showed EF 27%, no ischemia.  Patient was started on Lasix due to volume overload and it was titrated up.  Patient does not drink ETOH, smoke, or use drugs.  He has no family history of heart disease.     Recently returned to HF Clinic 09/29/21. He was doing better than in 07/2021.  He felt like Lasix had helped, peripheral edema had resolved.  He was short of breath with heavy exertion.  No chest pain.  No orthopnea/PND.  No lightheadedness.  He was able to walk on flat ground without dyspnea.  No palpitations.   Today he returns to HF clinic for pharmacist medication titration. At last visit with MD, losartan was discontinued and Entresto 24/26 mg BID and Farxiga 10 mg daily were initiated. Additionally, Lasix was decreased to 80 mg qam/40 qpm.  Lab results on 10/13/21 returned and showed a Scr bump. He was instructed to hold Lasix for 2 days then decrease to 40 mg BID. Overall he is feeling ok today. Notes that he does not get dizzy but does get lightheaded. Says this has been occurring since his stroke. No CP or palpitations. Still SOB with heavy exertion but believes his breathing has improved. Weight has been stable at home, 159-160 lbs. Takes Lasix 40 mg BID and has not needed any extra. Has  1+ LEE, which is stable. Wearing compression stockings. No PND/orthopnea. Taking all medications as prescribed and tolerating all medications.    HF Medications: Metoprolol succinate 25 mg daily Entresto 24/26 mg BID Farxiga 10 mg daily Lasix 40 mg BID Potassium chloride 10 mEq daily  Has the patient been experiencing any side effects to the medications prescribed?  no  Does the patient have any problems obtaining medications due to transportation or finances?   [employer] Medicare. Has [employer] patient assistance for Farxiga. [employer] assistance for [employer] pending (daughter will bring in POI).   Understanding of regimen: good Understanding of indications: good Potential of compliance: good Patient understands to avoid NSAIDs. Patient understands to avoid decongestants.    Pertinent Lab Values: 10/11/21: Serum creatinine 1.75, BUN 30, Potassium 4.6, Sodium 140,  BMET today pending  Vital Signs: Weight: 160.4 lbs (last clinic weight: 163.2 lbs) Blood pressure: 118/62  Heart rate: 70   Assessment/Plan: 1. Chronic systolic CHF:  Echo in 06/7866 with EF 25-30%, mild LVH, moderate RV enlargement with normal RV systolic function, PASP 65 mmHg, moderate TR, moderate AI, moderate MR, IVC dilated.  Cause of cardiomyopathy is uncertain.  No history of drugs/ETOH.  No family history of cardiomyopathy.  He does have risk factors for CAD (diabetes, HTN) though no chest pain.  - NYHA class II symptoms. Volume status stable. - BMET today pending. Will not make any  medication changes today until BMET results given significant Scr increase on last labs.  - Continue Lasix 40 mg BID - Continue metoprolol XL 25 mg daily.  - Continue Entresto 24/26 mg BID - Continue Farxiga 10 mg daily.  - Narrow QRS, not candidate for CRT.  With advanced age, probably will not aim for ICD.  2. Atrial fibrillation: Paroxysmal.  History of CVA.  - Continue Eliquis.  3. Hyperlipidemia: Good lipids in 08/2021.  - Continue  atorvastatin .  Follow up 4 weeks with Dr. Aundra Dubin.   Audry Riles, PharmD, BCPS, BCCP, CPP Heart Failure Clinic Pharmacist [phone]

## 2021-10-13 ENCOUNTER — Telehealth (HOSPITAL_COMMUNITY): Payer: Self-pay | Admitting: Surgery

## 2021-10-13 ENCOUNTER — Other Ambulatory Visit: Payer: Self-pay | Admitting: Adult Health

## 2021-10-13 ENCOUNTER — Encounter: Payer: Self-pay | Admitting: Adult Health

## 2021-10-13 DIAGNOSIS — I5021 Acute systolic (congestive) heart failure: Secondary | ICD-10-CM

## 2021-10-13 DIAGNOSIS — I5022 Chronic systolic (congestive) heart failure: Secondary | ICD-10-CM

## 2021-10-13 MED ORDER — FUROSEMIDE 80 MG PO TABS: ORAL_TABLET | ORAL | 11 refills | Status: DC

## 2021-10-13 MED ORDER — DULOXETINE HCL 20 MG PO CPEP: ORAL_CAPSULE | ORAL | 3 refills | Status: DC

## 2021-10-13 NOTE — Telephone Encounter (Signed)
I called Earl [name] and reviewed the lab results as well as the recommendations per Dr. Aundra Dubin.  I have updated the medication list and scheduled labwork for next Tuesday per patient's request.

## 2021-10-13 NOTE — Telephone Encounter (Signed)
-----   Message from Larey Dresser, MD sent at 10/12/2021  4:40 PM EST ----- Hold Lasix for 2 days then decrease to 40 mg bid.  BMET early next week.

## 2021-10-14 ENCOUNTER — Encounter (HOSPITAL_COMMUNITY): Payer: Self-pay | Admitting: Cardiology

## 2021-10-18 ENCOUNTER — Encounter (HOSPITAL_COMMUNITY): Payer: Self-pay | Admitting: Cardiology

## 2021-10-19 ENCOUNTER — Other Ambulatory Visit (HOSPITAL_COMMUNITY): Payer: Medicare Other

## 2021-10-20 NOTE — Telephone Encounter (Signed)
Advanced Heart Failure Patient Advocate Encounter  Sent in [employer] application via fax. Of note, patient's POI is missing, will be initially denied.   Will follow up.

## 2021-10-21 ENCOUNTER — Other Ambulatory Visit: Payer: Self-pay

## 2021-10-21 ENCOUNTER — Ambulatory Visit (HOSPITAL_COMMUNITY)
Admission: RE | Admit: 2021-10-21 | Discharge: 2021-10-21 | Disposition: A | Discharge status: Home / Self Care | Payer: Medicare Other | Source: Ambulatory Visit | Attending: Internal Medicine | Admitting: Internal Medicine

## 2021-10-21 VITALS — BP 118/62 | HR 70 | Wt 160.4 lb

## 2021-10-21 DIAGNOSIS — E785 Hyperlipidemia, unspecified: Secondary | ICD-10-CM | POA: Insufficient documentation

## 2021-10-21 DIAGNOSIS — Z7984 Long term (current) use of oral hypoglycemic drugs: Secondary | ICD-10-CM | POA: Insufficient documentation

## 2021-10-21 DIAGNOSIS — R9431 Abnormal electrocardiogram [ECG] [EKG]: Secondary | ICD-10-CM | POA: Insufficient documentation

## 2021-10-21 DIAGNOSIS — Z7901 Long term (current) use of anticoagulants: Secondary | ICD-10-CM | POA: Insufficient documentation

## 2021-10-21 DIAGNOSIS — E1141 Type 2 diabetes mellitus with diabetic mononeuropathy: Secondary | ICD-10-CM | POA: Diagnosis not present

## 2021-10-21 DIAGNOSIS — E1122 Type 2 diabetes mellitus with diabetic chronic kidney disease: Secondary | ICD-10-CM | POA: Diagnosis not present

## 2021-10-21 DIAGNOSIS — Z8249 Family history of ischemic heart disease and other diseases of the circulatory system: Secondary | ICD-10-CM | POA: Insufficient documentation

## 2021-10-21 DIAGNOSIS — I5022 Chronic systolic (congestive) heart failure: Secondary | ICD-10-CM | POA: Insufficient documentation

## 2021-10-21 DIAGNOSIS — Z8673 Personal history of transient ischemic attack (TIA), and cerebral infarction without residual deficits: Secondary | ICD-10-CM | POA: Diagnosis not present

## 2021-10-21 DIAGNOSIS — I083 Combined rheumatic disorders of mitral, aortic and tricuspid valves: Secondary | ICD-10-CM | POA: Diagnosis not present

## 2021-10-21 DIAGNOSIS — I69351 Hemiplegia and hemiparesis following cerebral infarction affecting right dominant side: Secondary | ICD-10-CM | POA: Diagnosis not present

## 2021-10-21 DIAGNOSIS — E119 Type 2 diabetes mellitus without complications: Secondary | ICD-10-CM | POA: Insufficient documentation

## 2021-10-21 DIAGNOSIS — I69393 Ataxia following cerebral infarction: Secondary | ICD-10-CM | POA: Diagnosis not present

## 2021-10-21 DIAGNOSIS — I480 Paroxysmal atrial fibrillation: Secondary | ICD-10-CM | POA: Diagnosis not present

## 2021-10-21 DIAGNOSIS — I110 Hypertensive heart disease with heart failure: Secondary | ICD-10-CM | POA: Diagnosis not present

## 2021-10-21 DIAGNOSIS — N182 Chronic kidney disease, stage 2 (mild): Secondary | ICD-10-CM | POA: Diagnosis not present

## 2021-10-21 LAB — BASIC METABOLIC PANEL
Anion gap: 9 (ref 5–15)
BUN: 20 mg/dL (ref 8–23)
CO2: 27 mmol/L (ref 22–32)
Calcium: 9.2 mg/dL (ref 8.9–10.3)
Chloride: 104 mmol/L (ref 98–111)
Creatinine, Ser: 1.22 mg/dL (ref 0.61–1.24)
GFR, Estimated: 60 mL/min — ABNORMAL LOW (ref >60)
Glucose, Bld: 106 mg/dL — ABNORMAL HIGH (ref 70–99)
Potassium: 4.3 mmol/L (ref 3.5–5.1)
Sodium: 140 mmol/L (ref 135–145)

## 2021-10-21 NOTE — Patient Instructions (Addendum)
It was a pleasure seeing you today!  MEDICATIONS: -No medication changes today -Call if you have questions about your medications.  LABS: -We will call you if your labs need attention.  NEXT APPOINTMENT: Return to clinic in 4 weeks with Dr. McLean.  In general, to take care of your heart failure: -Limit your fluid intake to 2 Liters (half-gallon) per day.   -Limit your salt intake to ideally 2-3 grams (2000-3000 mg) per day. -Weigh yourself daily and record, and bring that "weight diary" to your next appointment.  (Weight gain of 2-3 pounds in 1 day typically means fluid weight.) -The medications for your heart are to help your heart and help you live longer.   -Please contact us before stopping any of your heart medications.  Call the clinic at [phone] with questions or to reschedule future appointments.  

## 2021-10-23 ENCOUNTER — Encounter (HOSPITAL_COMMUNITY): Payer: Self-pay | Admitting: Cardiology

## 2021-10-25 NOTE — Telephone Encounter (Signed)
Sent in POI to [employer] via efax.   Will follow up.

## 2021-10-26 ENCOUNTER — Encounter (HOSPITAL_COMMUNITY): Payer: Self-pay | Admitting: Pharmacy Technician

## 2021-10-27 ENCOUNTER — Encounter: Payer: Self-pay | Admitting: Internal Medicine

## 2021-10-27 ENCOUNTER — Other Ambulatory Visit: Payer: Self-pay

## 2021-10-27 NOTE — Telephone Encounter (Signed)
Pt is requesting a refill on potassium 10 meq tablet. Base on the lab order, pt was to take for now. Would [name], PA, like to refill this medication? Please address

## 2021-10-28 DIAGNOSIS — I480 Paroxysmal atrial fibrillation: Secondary | ICD-10-CM | POA: Diagnosis not present

## 2021-10-28 DIAGNOSIS — I69393 Ataxia following cerebral infarction: Secondary | ICD-10-CM | POA: Diagnosis not present

## 2021-10-28 DIAGNOSIS — N182 Chronic kidney disease, stage 2 (mild): Secondary | ICD-10-CM | POA: Diagnosis not present

## 2021-10-28 DIAGNOSIS — I69351 Hemiplegia and hemiparesis following cerebral infarction affecting right dominant side: Secondary | ICD-10-CM | POA: Diagnosis not present

## 2021-10-28 DIAGNOSIS — E1141 Type 2 diabetes mellitus with diabetic mononeuropathy: Secondary | ICD-10-CM | POA: Diagnosis not present

## 2021-10-28 DIAGNOSIS — E1122 Type 2 diabetes mellitus with diabetic chronic kidney disease: Secondary | ICD-10-CM | POA: Diagnosis not present

## 2021-10-29 MED ORDER — POTASSIUM CHLORIDE ER 10 MEQ PO TBCR: 10.0000 meq | EXTENDED_RELEASE_TABLET | Freq: Every day | ORAL | 11 refills | Status: DC

## 2021-10-29 NOTE — Telephone Encounter (Signed)
Pt's medication was sent to pt's pharmacy as requested. Confirmation received.  °

## 2021-10-31 ENCOUNTER — Other Ambulatory Visit: Payer: Self-pay | Admitting: Adult Health

## 2021-11-02 DIAGNOSIS — E854 Organ-limited amyloidosis: Secondary | ICD-10-CM | POA: Insufficient documentation

## 2021-11-02 DIAGNOSIS — I428 Other cardiomyopathies: Secondary | ICD-10-CM | POA: Insufficient documentation

## 2021-11-03 ENCOUNTER — Encounter: Payer: Self-pay | Admitting: Internal Medicine

## 2021-11-03 ENCOUNTER — Other Ambulatory Visit: Payer: Self-pay

## 2021-11-03 ENCOUNTER — Ambulatory Visit: Payer: Medicare Other | Admitting: Internal Medicine

## 2021-11-03 VITALS — BP 106/50 | HR 72 | Ht 71.0 in | Wt 161.6 lb

## 2021-11-03 DIAGNOSIS — I6930 Unspecified sequelae of cerebral infarction: Secondary | ICD-10-CM

## 2021-11-03 DIAGNOSIS — I480 Paroxysmal atrial fibrillation: Secondary | ICD-10-CM | POA: Diagnosis not present

## 2021-11-03 DIAGNOSIS — I428 Other cardiomyopathies: Secondary | ICD-10-CM | POA: Diagnosis not present

## 2021-11-03 NOTE — Patient Instructions (Addendum)
Medication Instructions:  Your physician recommends that you continue on your current medications as directed. Please refer to the Current Medication list given to you today.  *If you need a refill on your cardiac medications before your next appointment, please call your pharmacy*   Lab Work: None ordered.  If you have labs (blood work) drawn today and your tests are completely normal, you will receive your results only by: MyChart Message (if you have MyChart) OR A paper copy in the mail If you have any lab test that is abnormal or we need to change your treatment, we will call you to review the results.   Testing/Procedures: None ordered.    Follow-Up: At [facility], you and your health needs are our priority.  As part of our continuing mission to provide you with exceptional heart care, we have created designated Provider Care Teams.  These Care Teams include your primary Cardiologist (physician) and Advanced Practice Providers (APPs -  Physician Assistants and Nurse Practitioners) who all work together to provide you with the care you need, when you need it.  We recommend signing up for the patient portal called "MyChart".  Sign up information is provided on this After Visit Summary.  MyChart is used to connect with patients for Virtual Visits (Telemedicine).  Patients are able to view lab/test results, encounter notes, upcoming appointments, etc.  Non-urgent messages can be sent to your provider as well.   To learn more about what you can do with MyChart, go to [email or web address].    Your next appointment:   6 months with Dr Klein 

## 2021-11-03 NOTE — Progress Notes (Deleted)
Patient ID: Earl [name], male   DOB: Jan 11, 1941, 80 y.o.   MRN: [id number]      Patient Care Team: [name], MD as PCP - General (Internal Medicine) Deboraha Sprang, MD as PCP - Cardiology (Cardiology) Deboraha Sprang, MD as PCP - Electrophysiology (Cardiology) Inda Castle, MD (Inactive) as Consulting Physician (Gastroenterology) Wyatt Portela, MD as Consulting Physician (Oncology) Sharyne Peach, MD as Consulting Physician (Ophthalmology)   HPI  Earl [name] is a 80 y.o. 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 Atrial fibrillation was identified and he takes apixaban  Seen Dr. TTE 9/22 with new onset peripheral edema dyspnea on exertion.  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  Seen by heart failure.  Started on Farxiga, Entresto switched out for losartan complicated by some lightheadedness.  Lasix was down titrated to 40 twice daily..   The patient denies chest pain***, shortness of breath***, nocturnal dyspnea***, orthopnea*** or peripheral edema***.  There have been no palpitations***, lightheadedness*** or syncope***.  Complains of ***.       Wife deceased 16-Jul-2023 survived by 2 children Her name was Earl [name] they met at [facility]   Date Cr K Hgb  1/20 1.18 3.9 12.7  11/21  0.99 4.3 13.0  3/22 1.16 4.6 12.8    DATE TEST EF   9/18  TEE 55-65% No CSE  9/18 Echo   55-60 %   9/22 Echo  25-30%   11/22 R/LHC  No obstruc CAD    Past Medical History:  Diagnosis Date   Adenomatous colon polyp    Allergy    Anal fissure    Arthritis    BPH (benign prostatic hyperplasia)    Diabetes mellitus (HCC)    GERD (gastroesophageal reflux disease)    Hyperlipidemia    Hypertension    Hypothyroidism    Other testicular hypofunction    Stroke [facility])     Past Surgical History:  Procedure Laterality Date   CIRCUMCISION N/A 06/11/2019   Procedure: CIRCUMCISION ADULT;  Surgeon: [name], MD;  Location: [facility];  Service: Urology;  Laterality: N/A;   COLONOSCOPY     INGUINAL HERNIA REPAIR Bilateral    1968 and 2004   LOOP RECORDER INSERTION N/A 07/28/2017   Procedure: LOOP RECORDER INSERTION;  Surgeon: Deboraha Sprang, MD;  Location: [place] [facility] LAB;  Service: Cardiovascular;  Laterality: N/A;   RIGHT/LEFT HEART CATH AND CORONARY ANGIOGRAPHY N/A 10/07/2021   Procedure: RIGHT/LEFT HEART CATH AND CORONARY ANGIOGRAPHY;  Surgeon: Larey Dresser, MD;  Location: [place] CV LAB;  Service: Cardiovascular;  Laterality: N/A;   TEE WITHOUT CARDIOVERSION N/A 07/28/2017   Procedure: TRANSESOPHAGEAL ECHOCARDIOGRAM (TEE);  Surgeon: Sueanne Margarita, MD;  Location: [facility] ENDOSCOPY;  Service: Cardiovascular;  Laterality: N/A;    Current Meds  Medication Sig   apixaban (ELIQUIS) 5 MG TABS tablet TAKE 1 TABLET BY MOUTH  TWICE DAILY TO PREVENT  BLOOD CLOTS   atorvastatin (LIPITOR) 40 MG tablet TAKE 1 TABLET BY MOUTH  DAILY FOR CHOLESTEROL   Blood Glucose Monitoring Suppl (ONE TOUCH ULTRA 2) w/Device KIT Check blood sugar 1 time a day-DX-E11.9   Cholecalciferol (VITAMIN D PO) Take 1 tablet by mouth daily.    Cyanocobalamin (B-12) 50 MCG TABS Take 50 mcg by mouth daily.   dapagliflozin propanediol (FARXIGA) 10 MG TABS tablet Take 1 tablet (10 mg total) by mouth daily before breakfast.   DULoxetine (CYMBALTA) 20 MG capsule Take 1  cap daily for mood and pain.   ferrous sulfate 325 (65 FE) MG tablet Take 325 mg by mouth at bedtime.   finasteride (PROSCAR) 5 MG tablet TAKE 1 TABLET BY MOUTH  DAILY FOR PROSTATE   furosemide (LASIX) 80 MG tablet Take 0.5 tablets (40 mg total) by mouth every morning AND 0.5 tablets (40 mg total) every evening.   gabapentin (NEURONTIN) 300 MG capsule Take  1 to 2 capsules  1 hour before Bedtime  for Diabetic Neuropathy pains /Patient knows to take by mouth (Patient taking differently: at bedtime. Take  1 to 2 capsules  2 hour before Bedtime  for Diabetic  Neuropathy pains /Patient knows to take by mouth)   glipiZIDE (GLUCOTROL) 5 MG tablet Take 1/2 tablet  2 x /day  with Meals  for Diabetes (Patient taking differently: Take 1/2 tablet  2 x /day  with Meals  for Diabetes)   glucose blood (ONETOUCH ULTRA) test strip CHECK BLOOD SUGAR ONCE A DAY.   Lancets (ONETOUCH DELICA PLUS [id number]) MISC CHECK BLOOD SUGAR ONCE A DAY.   Lancets Misc. (ONE TOUCH SURESOFT) MISC Check blood sugar 1 time a day-DX-E11.91   levothyroxine (SYNTHROID) 50 MCG tablet TAKE WHOLE TAB SUN/TUES/THURS, TAKE 1/2 TAB ALL OTHER DAYS. TAKE IN THE MORNING ON AN EMPTY STOMACH WITH ONLY WATER 30 MINUTES BEFORE BREAKFAST AND NO ANTACID MEDS, CALCIUM OR MAGNESIUM FOR 4 HOURS AND AVOID BIOTIN   Magnesium 250 MG TABS Take 250 mg by mouth daily.    metFORMIN (GLUCOPHAGE-XR) 500 MG 24 hr tablet Take  2 tablets  2 x /day  with Meals  for Diabetes                                                           /                                  TAKE 2 TABLETS BY MOUTH   metoprolol succinate (TOPROL XL) 25 MG 24 hr tablet Take 1 tablet (25 mg total) by mouth at bedtime.   Multiple Vitamin (MULTIVITAMIN) capsule Take 1 capsule by mouth daily.   potassium chloride (KLOR-CON) 10 MEQ tablet Take 1 tablet (10 mEq total) by mouth daily.   sacubitril-valsartan (ENTRESTO) 24-26 MG Take 1 tablet by mouth 2 (two) times daily.   triamcinolone ointment (KENALOG) 0.1 % Apply topically 2 (two) times daily. Apply to rash 2 to 3 x /day (Patient taking differently: Apply 1 application topically daily as needed (Private area).)   trolamine salicylate (ASPERCREME) 10 % cream Apply 1 application topically daily as needed for muscle pain.   vitamin C (ASCORBIC ACID) 500 MG tablet Take 500 mg by mouth daily.    Allergies  Allergen Reactions   Penicillins Other (See Comments)    Did it involve swelling of the face/tongue/throat, SOB, or low BP? Unknown Did it involve sudden or severe rash/hives, skin peeling, or any  reaction on the inside of your mouth or nose? Unknown Did you need to seek medical attention at a hospital or doctor's office? No When did it last happen?    Over 22 Years Ago   If all above answers are "NO", may proceed with cephalosporin use.  Physical Exam: BP (!) 106/50    Pulse 72    Ht $R'5\' 11"'pF$  (1.803 m)    Wt 161 lb 9.6 oz (73.3 kg)    SpO2 99%    BMI 22.54 kg/m  Well developed and nourished in no acute distress HENT normal Neck supple with JVP-  flat *** Clear Regular rate and rhythm, no murmurs or gallops Abd-soft with active BS No Clubbing cyanosis edema Skin-warm and dry A & Oriented  Grossly normal sensory and motor function  ECG ***    Assessment and  Plan  ESUS  Dizziness  Atrial fibrillation  HTN  Orthostatic hypertension   No interval neurological events.  We will continue him on apixaban 5 mg twice daily.  Mildly volume overloaded.  We will have him increase his furosemide from 40 daily to 40 twice daily for 3 days  Blood pressure is well controlled.  Will reach out to his primary care physician regarding the addition of an ARB for renal protection in the context of his diabetes would recommend losartan 25  No interval atrial fibrillation which he is aware.  Continue with Eliquis.               [name], MD 11/03/2021 11:19 AM

## 2021-11-03 NOTE — Progress Notes (Signed)
Patient ID: Earl [name], male   DOB: 02-09-1941, 80 y.o.   MRN: [id number]      Patient Care Team: [name], MD as PCP - General (Internal Medicine) Deboraha Sprang, MD as PCP - Cardiology (Cardiology) Deboraha Sprang, MD as PCP - Electrophysiology (Cardiology) Inda Castle, MD (Inactive) as Consulting Physician (Gastroenterology) Wyatt Portela, MD as Consulting Physician (Oncology) Sharyne Peach, MD as Consulting Physician (Ophthalmology)   HPI  Earl [name] is a 80 y.o. 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 Atrial fibrillation was identified and he takes apixaban  Seen Dr. TTE 9/22 with new onset peripheral edema dyspnea on exertion.  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  Seen by heart failure.  Started on Farxiga, Entresto switched out for losartan complicated by some lightheadedness.  Lasix was down titrated to 40 twice daily..   The patient denies chest pain, nocturnal dyspnea, orthopnea.  There have been no palpitations, or syncope.  Complains of lightheadedness that is associated with falling as well as some dyspnea.  He has mild persistent edema.  The light headedness occurs most when he is turning around. Light headedness also occurs when getting out of the shower.  In regards to fall risk, he states that he is at risk as he fell around a month ago after turning around too quickly.   His daughter is concerned about walking assistance, as he moves noticeably better when using a walker, as opposed to a cane. The walker he used after having had a stroke is still available to him.  Lower leg edema noted. He states that the swelling has not changed recently, or since having decreased his diuretic   Wife deceased July 23, 2023 survived by 2 children Her name was Earl [name] they met at [facility]   Date Cr K Hgb  1/20 1.18 3.9 12.7  11/21  0.99 4.3 13.0  3/22 1.16 4.6 12.8    DATE TEST EF    9/18  TEE 55-65% No CSE  9/18 Echo   55-60 %   9/22 Echo  25-30%   11/22 R/LHC  No obstruc CAD    Past Medical History:  Diagnosis Date   Adenomatous colon polyp    Allergy    Anal fissure    Arthritis    BPH (benign prostatic hyperplasia)    Diabetes mellitus (HCC)    GERD (gastroesophageal reflux disease)    Hyperlipidemia    Hypertension    Hypothyroidism    Other testicular hypofunction    Stroke [facility])     Past Surgical History:  Procedure Laterality Date   CIRCUMCISION N/A 06/11/2019   Procedure: CIRCUMCISION ADULT;  Surgeon: [name], MD;  Location: [facility];  Service: Urology;  Laterality: N/A;   COLONOSCOPY     INGUINAL HERNIA REPAIR Bilateral    1968 and 2004   LOOP RECORDER INSERTION N/A 07/28/2017   Procedure: LOOP RECORDER INSERTION;  Surgeon: Deboraha Sprang, MD;  Location: [place] [facility] LAB;  Service: Cardiovascular;  Laterality: N/A;   RIGHT/LEFT HEART CATH AND CORONARY ANGIOGRAPHY N/A 10/07/2021   Procedure: RIGHT/LEFT HEART CATH AND CORONARY ANGIOGRAPHY;  Surgeon: Larey Dresser, MD;  Location: [place] [facility] LAB;  Service: Cardiovascular;  Laterality: N/A;   TEE WITHOUT CARDIOVERSION N/A 07/28/2017   Procedure: TRANSESOPHAGEAL ECHOCARDIOGRAM (TEE);  Surgeon: Sueanne Margarita, MD;  Location: [facility] ENDOSCOPY;  Service: Cardiovascular;  Laterality: N/A;    Current Meds  Medication Sig   apixaban (  ELIQUIS) 5 MG TABS tablet TAKE 1 TABLET BY MOUTH  TWICE DAILY TO PREVENT  BLOOD CLOTS   atorvastatin (LIPITOR) 40 MG tablet TAKE 1 TABLET BY MOUTH  DAILY FOR CHOLESTEROL   Blood Glucose Monitoring Suppl (ONE TOUCH ULTRA 2) w/Device KIT Check blood sugar 1 time a day-DX-E11.9   Cholecalciferol (VITAMIN D PO) Take 1 tablet by mouth daily.    Cyanocobalamin (B-12) 50 MCG TABS Take 50 mcg by mouth daily.   dapagliflozin propanediol (FARXIGA) 10 MG TABS tablet Take 1 tablet (10 mg total) by mouth daily before breakfast.   DULoxetine (CYMBALTA) 20 MG capsule Take 1  cap daily for mood and pain.   ferrous sulfate 325 (65 FE) MG tablet Take 325 mg by mouth at bedtime.   finasteride (PROSCAR) 5 MG tablet TAKE 1 TABLET BY MOUTH  DAILY FOR PROSTATE   furosemide (LASIX) 80 MG tablet Take 0.5 tablets (40 mg total) by mouth every morning AND 0.5 tablets (40 mg total) every evening.   gabapentin (NEURONTIN) 300 MG capsule Take  1 to 2 capsules  1 hour before Bedtime  for Diabetic Neuropathy pains /Patient knows to take by mouth (Patient taking differently: at bedtime. Take  1 to 2 capsules  2 hour before Bedtime  for Diabetic Neuropathy pains /Patient knows to take by mouth)   glipiZIDE (GLUCOTROL) 5 MG tablet Take 1/2 tablet  2 x /day  with Meals  for Diabetes (Patient taking differently: Take 1/2 tablet  2 x /day  with Meals  for Diabetes)   glucose blood (ONETOUCH ULTRA) test strip CHECK BLOOD SUGAR ONCE A DAY.   Lancets (ONETOUCH DELICA PLUS [id number]) MISC CHECK BLOOD SUGAR ONCE A DAY.   Lancets Misc. (ONE TOUCH SURESOFT) MISC Check blood sugar 1 time a day-DX-E11.91   levothyroxine (SYNTHROID) 50 MCG tablet TAKE WHOLE TAB SUN/TUES/THURS, TAKE 1/2 TAB ALL OTHER DAYS. TAKE IN THE MORNING ON AN EMPTY STOMACH WITH ONLY WATER 30 MINUTES BEFORE BREAKFAST AND NO ANTACID MEDS, CALCIUM OR MAGNESIUM FOR 4 HOURS AND AVOID BIOTIN   Magnesium 250 MG TABS Take 250 mg by mouth daily.    metFORMIN (GLUCOPHAGE-XR) 500 MG 24 hr tablet Take  2 tablets  2 x /day  with Meals  for Diabetes                                                           /                                  TAKE 2 TABLETS BY MOUTH   metoprolol succinate (TOPROL XL) 25 MG 24 hr tablet Take 1 tablet (25 mg total) by mouth at bedtime.   Multiple Vitamin (MULTIVITAMIN) capsule Take 1 capsule by mouth daily.   potassium chloride (KLOR-CON) 10 MEQ tablet Take 1 tablet (10 mEq total) by mouth daily.   sacubitril-valsartan (ENTRESTO) 24-26 MG Take 1 tablet by mouth 2 (two) times daily.   triamcinolone ointment (KENALOG)  0.1 % Apply topically 2 (two) times daily. Apply to rash 2 to 3 x /day (Patient taking differently: Apply 1 application topically daily as needed (Private area).)   trolamine salicylate (ASPERCREME) 10 % cream Apply 1 application topically daily as needed for  muscle pain.   vitamin C (ASCORBIC ACID) 500 MG tablet Take 500 mg by mouth daily.    Allergies  Allergen Reactions   Penicillins Other (See Comments)    Did it involve swelling of the face/tongue/throat, SOB, or low BP? Unknown Did it involve sudden or severe rash/hives, skin peeling, or any reaction on the inside of your mouth or nose? Unknown Did you need to seek medical attention at a hospital or doctor's office? No When did it last happen?    Over 36 Years Ago   If all above answers are "NO", may proceed with cephalosporin use.      Physical Exam: BP (!) 106/50    Pulse 72    Ht _0  (1.803 m)    Wt 161 lb 9.6 oz (73.3 kg)    SpO2 99%    BMI 22.54 kg/m  Well developed and nourished in no acute distress HENT normal Neck supple with JVP-  flat   Clear Regular rate and rhythm, no murmurs or gallops Abd-soft with active BS No Clubbing cyanosis edema Skin-warm and dry A & Oriented  Grossly normal sensory and motor function  ECG sinus at 72 with frequent PACs Intervals 18/09/42   Assessment and  Plan  ESUS  Dizziness  Atrial fibrillation  HTN  Orthostatic hypertension   Interval fall.  Lengthy discussion regarding risk of falling and the importance of support.  Discussed the role of a CUBII for leg strengthening.  Encouraged him to follow-up with his PCP to work further on balance and strengthening.  I was surprised that there was little orthostatic change, none of blood pressure about a 20 point increase in heart rate.  Not sure that this is significant.  Edema is stable not withstanding the down titration of his furosemide.  We will continue at 40 twice daily.  Blood pressures are reasonable.  He will  continue him on Entresto 24/26 metoprolol 25 and Farxiga 10                I,Jordan Kelly,acting as a Education administrator for [name], MD.,have documented all relevant documentation on the behalf of [name], MD,as directed by  [name], MD while in the presence of [name], MD. I, [name], MD, have reviewed all documentation for this visit. The documentation on 11/03/21 for the exam, diagnosis, procedures, and orders are all accurate and complete.  11/03/2021 11:29 AM

## 2021-11-04 DIAGNOSIS — I69351 Hemiplegia and hemiparesis following cerebral infarction affecting right dominant side: Secondary | ICD-10-CM | POA: Diagnosis not present

## 2021-11-04 DIAGNOSIS — I480 Paroxysmal atrial fibrillation: Secondary | ICD-10-CM | POA: Diagnosis not present

## 2021-11-04 DIAGNOSIS — E1141 Type 2 diabetes mellitus with diabetic mononeuropathy: Secondary | ICD-10-CM | POA: Diagnosis not present

## 2021-11-04 DIAGNOSIS — N182 Chronic kidney disease, stage 2 (mild): Secondary | ICD-10-CM | POA: Diagnosis not present

## 2021-11-04 DIAGNOSIS — I69393 Ataxia following cerebral infarction: Secondary | ICD-10-CM | POA: Diagnosis not present

## 2021-11-04 DIAGNOSIS — E1122 Type 2 diabetes mellitus with diabetic chronic kidney disease: Secondary | ICD-10-CM | POA: Diagnosis not present

## 2021-11-05 ENCOUNTER — Other Ambulatory Visit: Payer: Self-pay | Admitting: Internal Medicine

## 2021-11-09 ENCOUNTER — Encounter: Payer: Self-pay | Admitting: Internal Medicine

## 2021-11-11 DIAGNOSIS — I69393 Ataxia following cerebral infarction: Secondary | ICD-10-CM | POA: Diagnosis not present

## 2021-11-11 DIAGNOSIS — N182 Chronic kidney disease, stage 2 (mild): Secondary | ICD-10-CM | POA: Diagnosis not present

## 2021-11-11 DIAGNOSIS — E1141 Type 2 diabetes mellitus with diabetic mononeuropathy: Secondary | ICD-10-CM | POA: Diagnosis not present

## 2021-11-11 DIAGNOSIS — I480 Paroxysmal atrial fibrillation: Secondary | ICD-10-CM | POA: Diagnosis not present

## 2021-11-11 DIAGNOSIS — I69351 Hemiplegia and hemiparesis following cerebral infarction affecting right dominant side: Secondary | ICD-10-CM | POA: Diagnosis not present

## 2021-11-11 DIAGNOSIS — E1122 Type 2 diabetes mellitus with diabetic chronic kidney disease: Secondary | ICD-10-CM | POA: Diagnosis not present

## 2021-11-17 ENCOUNTER — Encounter (HOSPITAL_COMMUNITY): Payer: Self-pay | Admitting: Cardiology

## 2021-11-17 ENCOUNTER — Other Ambulatory Visit: Payer: Self-pay

## 2021-11-17 ENCOUNTER — Ambulatory Visit (HOSPITAL_COMMUNITY)
Admission: RE | Admit: 2021-11-17 | Discharge: 2021-11-17 | Disposition: A | Discharge status: Home / Self Care | Payer: Medicare Other | Source: Ambulatory Visit | Attending: Cardiology | Admitting: Cardiology

## 2021-11-17 VITALS — BP 119/76 | HR 69 | Wt 158.0 lb

## 2021-11-17 DIAGNOSIS — I951 Orthostatic hypotension: Secondary | ICD-10-CM | POA: Insufficient documentation

## 2021-11-17 DIAGNOSIS — E854 Organ-limited amyloidosis: Secondary | ICD-10-CM | POA: Insufficient documentation

## 2021-11-17 DIAGNOSIS — E1142 Type 2 diabetes mellitus with diabetic polyneuropathy: Secondary | ICD-10-CM | POA: Diagnosis not present

## 2021-11-17 DIAGNOSIS — I2510 Atherosclerotic heart disease of native coronary artery without angina pectoris: Secondary | ICD-10-CM | POA: Insufficient documentation

## 2021-11-17 DIAGNOSIS — Z7984 Long term (current) use of oral hypoglycemic drugs: Secondary | ICD-10-CM | POA: Diagnosis not present

## 2021-11-17 DIAGNOSIS — G909 Disorder of the autonomic nervous system, unspecified: Secondary | ICD-10-CM | POA: Insufficient documentation

## 2021-11-17 DIAGNOSIS — Z7901 Long term (current) use of anticoagulants: Secondary | ICD-10-CM | POA: Diagnosis not present

## 2021-11-17 DIAGNOSIS — I5022 Chronic systolic (congestive) heart failure: Secondary | ICD-10-CM | POA: Diagnosis not present

## 2021-11-17 DIAGNOSIS — I480 Paroxysmal atrial fibrillation: Secondary | ICD-10-CM | POA: Diagnosis not present

## 2021-11-17 DIAGNOSIS — Z79899 Other long term (current) drug therapy: Secondary | ICD-10-CM | POA: Diagnosis not present

## 2021-11-17 DIAGNOSIS — I428 Other cardiomyopathies: Secondary | ICD-10-CM | POA: Insufficient documentation

## 2021-11-17 DIAGNOSIS — I491 Atrial premature depolarization: Secondary | ICD-10-CM | POA: Diagnosis not present

## 2021-11-17 DIAGNOSIS — I43 Cardiomyopathy in diseases classified elsewhere: Secondary | ICD-10-CM | POA: Insufficient documentation

## 2021-11-17 DIAGNOSIS — I110 Hypertensive heart disease with heart failure: Secondary | ICD-10-CM | POA: Insufficient documentation

## 2021-11-17 DIAGNOSIS — Z8673 Personal history of transient ischemic attack (TIA), and cerebral infarction without residual deficits: Secondary | ICD-10-CM | POA: Insufficient documentation

## 2021-11-17 DIAGNOSIS — E785 Hyperlipidemia, unspecified: Secondary | ICD-10-CM | POA: Insufficient documentation

## 2021-11-17 LAB — BASIC METABOLIC PANEL
Anion gap: 8 (ref 5–15)
BUN: 19 mg/dL (ref 8–23)
CO2: 26 mmol/L (ref 22–32)
Calcium: 9.1 mg/dL (ref 8.9–10.3)
Chloride: 105 mmol/L (ref 98–111)
Creatinine, Ser: 1.11 mg/dL (ref 0.61–1.24)
GFR, Estimated: >60 mL/min (ref >60)
Glucose, Bld: 130 mg/dL — ABNORMAL HIGH (ref 70–99)
Potassium: 3.8 mmol/L (ref 3.5–5.1)
Sodium: 139 mmol/L (ref 135–145)

## 2021-11-17 MED ORDER — SPIRONOLACTONE 25 MG PO TABS: 12.5000 mg | ORAL_TABLET | Freq: Every day | ORAL | 3 refills | Status: DC

## 2021-11-17 MED ORDER — METOPROLOL SUCCINATE ER 25 MG PO TB24: 25.0000 mg | ORAL_TABLET | Freq: Two times a day (BID) | ORAL | 3 refills | Status: DC

## 2021-11-17 NOTE — Progress Notes (Signed)
PCP: [name], MD Cardiology: Dr. Caryl Comes HF Cardiology: Dr. Aundra Dubin  80 y.o. with history of type 2 DM, HTN, CVA, paroxysmal atrial fibrillation, and chronic systolic CHF was referred by Dr. Caryl Comes for evaluation of CHF.  Patient is on Eliquis now for PAF, he had a CVA in 2018.  Echo at that time showed EF 55-60%.  In the summer of 2022, he developed progressive exertional dyspnea. He was seen in the ER and was noted to have mildly elevate HS-TnI without trend, thought to be due to volume overload from CHF.  Echo was done in 9/22 showing EF 25-30%, mild LVH, moderate RV enlargement with normal RV systolic function, PASP 65 mmHg, moderate TR, moderate AI, moderate MR, IVC dilated.  Cardiolite in 9/22 showed EF 27%, no ischemia.  Patient was started on Lasix due to volume overload and it was titrated up.  Patient does not drink ETOH, smoke, or use drugs.  He has not family history of heart disease.    RHC/LHC in 11/22 showed mild nonobstructive CAD, CI 4.36, optimized filling pressures.   He returns for followup of CHF.  Right leg remains weak from stroke.  He fatigues easily.  No dyspnea walking on flat ground.  No lightheadedness with standing anymore.  In the past, he had orthostatic symptoms.  No orthopnea/PND.  He has tingling in his feet and hands attributed to diabetic neuropathy.   Weight down 5 lbs.   Labs (10/22): LDL 44, TSH normal Labs (11/22): K 4, creatinine 1.17 Labs (12/22): K 4.3, creatinine 1.22  ECG (personally reviewed): NSR, nonspecific T wave flattening  PMH: 1. Type 2 diabetes 2. HTN 3. Hyperlipidemia 4. Atrial fibrillation: Paroxysmal 5. 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  6. GERD 7. Hypothyroidism 8. Chronic systolic CHF: Echo (8/92) with EF 55-60%.  - Echo (9/22): EF 25-30%, mild LVH, moderate RV enlargement with normal RV systolic function, PASP 65 mmHg, moderate TR, moderate AI, moderate MR, IVC dilated.  - Cardiolite (9/22): EF 27%, no ischemia.  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[employer], occupational: RETIRED  Tobacco Use   Smoking status: Never   Smokeless tobacco: Never  Vaping Use   Vaping Use: Never used  Substance and Sexual Activity   Alcohol use: No   Drug use: No   Sexual activity: Not on file  Other Topics Concern   Not on file  Social History Narrative   Not on file   Social Determinants of Health   Financial Resource Strain: Not on file  Food Insecurity: Not on file  Transportation Needs: Not on file  Physical Activity: Not on file  Stress: Not on file  Social Connections: Not on file  Intimate Partner Violence: Not on file   Family History  Problem Relation Age of Onset   Sickle cell anemia Mother    Diabetes Father    Stomach cancer Father    Colon cancer Father    CAD Neg Hx    Esophageal cancer Neg Hx    Rectal cancer Neg Hx    ROS: All systems reviewed and negative except as per HPI.  Current Outpatient Medications  Medication Sig Dispense Refill   apixaban (ELIQUIS) 5 MG TABS tablet TAKE 1 TABLET BY MOUTH  TWICE DAILY TO PREVENT  BLOOD CLOTS 180 tablet 3   atorvastatin (LIPITOR) 40 MG tablet TAKE 1 TABLET BY MOUTH  DAILY FOR CHOLESTEROL 90 tablet 3   Blood Glucose Monitoring Suppl (ONE TOUCH ULTRA 2) w/Device KIT Check blood sugar 1 time a day-DX-E11.9 1 kit 0   Cholecalciferol (VITAMIN D PO) Take 1 tablet by mouth daily.      Cyanocobalamin (B-12) 50 MCG TABS Take 50 mcg by mouth daily.     dapagliflozin propanediol (FARXIGA) 10 MG TABS tablet Take 1 tablet (10 mg total) by mouth daily before breakfast. 30 tablet 11   DULoxetine (CYMBALTA) 20 MG capsule Take 1 cap daily for  mood and pain. 90 capsule 3   ferrous sulfate 325 (65 FE) MG tablet Take 325 mg by mouth at bedtime.     finasteride (PROSCAR) 5 MG tablet TAKE 1 TABLET BY MOUTH  DAILY FOR PROSTATE 90 tablet 3   furosemide (LASIX) 80 MG tablet Take 0.5 tablets (40 mg total) by mouth every morning AND 0.5 tablets (40 mg total) every evening. 45 tablet 11   gabapentin (NEURONTIN) 300 MG capsule Take  1 to 2 capsules  1 hour before Bedtime  for Diabetic Neuropathy pains /Patient knows to take by mouth 180 capsule 3   glipiZIDE (GLUCOTROL) 5 MG tablet Take 1/2 tablet  2 x /day  with Meals  for Diabetes 90 tablet 3   Lancets (ONETOUCH DELICA PLUS [id number]) MISC CHECK BLOOD SUGAR ONCE A DAY. 100 each 3   Lancets Misc. (ONE TOUCH SURESOFT) MISC Check blood sugar 1 time a day-DX-E11.91 1 each 0   levothyroxine (SYNTHROID) 50 MCG tablet TAKE WHOLE TAB SUN/TUES/THURS, TAKE 1/2 TAB ALL OTHER DAYS. TAKE IN THE MORNING ON AN EMPTY STOMACH WITH ONLY WATER 30 MINUTES BEFORE BREAKFAST AND NO ANTACID MEDS, CALCIUM OR MAGNESIUM FOR 4 HOURS AND AVOID BIOTIN 90 tablet 3   Magnesium 250 MG TABS Take 250 mg by mouth daily.      metFORMIN (GLUCOPHAGE-XR) 500 MG 24 hr tablet Take  2 tablets  2 x /day  with Meals  for Diabetes                                                           /                                  TAKE 2 TABLETS BY MOUTH 360 tablet 3   Multiple Vitamin (MULTIVITAMIN) capsule Take 1 capsule by mouth daily.     ONETOUCH ULTRA test strip CHECK BLOOD SUGAR ONCE A DAY. 100 strip 3   sacubitril-valsartan (ENTRESTO) 24-26 MG Take 1 tablet by mouth 2 (two) times daily. 180 tablet 3   spironolactone (ALDACTONE) 25 MG tablet Take 0.5 tablets (12.5 mg total) by mouth at bedtime. 15 tablet 3   triamcinolone ointment (KENALOG) 0.1 % Apply topically 2 (two) times daily. Apply to rash 2 to 3 x /day 80 g 1   trolamine salicylate (ASPERCREME) 10 % cream Apply 1 application topically daily as needed for muscle pain.     vitamin  C  (ASCORBIC ACID) 500 MG tablet Take 500 mg by mouth daily.     metoprolol succinate (TOPROL XL) 25 MG 24 hr tablet Take 1 tablet (25 mg total) by mouth in the morning and at bedtime. 90 tablet 3   No current facility-administered medications for this encounter.   BP 119/76    Pulse 69    Wt 71.7 kg (158 lb)    SpO2 99%    BMI 22.04 kg/m  General: NAD Neck: No JVD, +Thyromegaly  Lungs: Clear to auscultation bilaterally with normal respiratory effort. CV: Nondisplaced PMI.  Heart regular S1/S2, no S3/S4, no murmur.  Trace ankle edema.  No carotid bruit.  Normal pedal pulses.  Abdomen: Soft, nontender, no hepatosplenomegaly, no distention.  Skin: Intact without lesions or rashes.  Neurologic: Alert and oriented x 3.  Psych: Normal affect. Extremities: No clubbing or cyanosis.  HEENT: Normal.   Assessment/Plan: 1. Chronic systolic CHF:  Echo in 8/88 with EF 25-30%, mild LVH, moderate RV enlargement with normal RV systolic function, PASP 65 mmHg, moderate TR, moderate AI, moderate MR, IVC dilated.  LHC/RHC in 11/22 showed mild nonobstructive CAD with optimized filling pressures and preserved cardiac output.  Cause of cardiomyopathy is uncertain.  No history of drugs/ETOH.  No family history of cardiomyopathy.  He does have some risk for cardiac amyloidosis with peripheral neuropathy and autonomic neuropathy (orthostatic hypotension) symptoms (alternatively could be due to diabetes).  On exam, he is not significantly volume overloaded.  NYHA class II.  - Conitnue Entresto 24/26 bid.   - Continue Lasix 40 mg bid.   - Continue Farxiga 10 mg daily.  - Increase Toprol XL to 25 mg bid.  - Add spironolactone 12.5 mg qhs.  BMET today and in 10 days. Stop KCl.  - Narrow QRS, not candidate for CRT.  With advanced age, probably will not aim for ICD.  - I will arrange for cardiac MRI to assess for evidence of cardiac amyloidosis or myocarditis.  2. Atrial fibrillation: Paroxysmal.  He is in NSR today.  History of CVA.  - Continue Eliquis.  3. Hyperlipidemia: Good lipids in 10/22.  - Continue atorvastatin .   Followup with HF pharmacist in 3 wks for med titration x 2 appts.  See me in 2 months.    Loralie Champagne 11/17/2021

## 2021-11-17 NOTE — Patient Instructions (Signed)
Medication Changes:  Start Spironolactone 12.5 mg (1/2 tab) Daily at bedtime  Increase Metoprolol XL to 25 mg Twice daily   Stop Potassium (Klor con)  Lab Work:  Labs done today, your results will be available in MyChart, we will contact you for abnormal readings.  Your physician recommends that you return for lab work in: 2 weeks  Testing/Procedures:  Your physician has requested that you have a cardiac MRI. Cardiac MRI uses a computer to create images of your heart as its beating, producing both still and moving pictures of your heart and major blood vessels. For further information please visit [email or web address] Please follow the instruction sheet given to you today for more information. ONCE APPROVED BY YOUR INSURANCE WE WILL CALL YOU TO SCHEDULE  Referrals:  NONE  Special Instructions // Education:  Do the following things EVERYDAY: Weigh yourself in the morning before breakfast. Write it down and keep it in a log. Take your medicines as prescribed Eat low salt foods--Limit salt (sodium) to 2000 mg per day.  Stay as active as you can everyday Limit all fluids for the day to less than 2 liters  Follow-Up in:   Please follow up with our heart failure pharmacist in 3 weeks  Your physician recommends that you schedule a follow-up appointment in: 2 months with Dr Aundra Dubin   At the [facility], you and your health needs are our priority. We have a designated team specialized in the treatment of Heart Failure. This Care Team includes your primary Heart Failure Specialized Cardiologist (physician), Advanced Practice Providers (APPs- Physician Assistants and Nurse Practitioners), and Pharmacist who all work together to provide you with the care you need, when you need it.   You may see any of the following providers on your designated Care Team at your next follow up:  Dr Glori Bickers Dr Haynes Kerns, NP Lyda Jester, [place] [facility] [place], [place] Audry Riles, PharmD   Please be sure to bring in all your medications bottles to every appointment.   Need to Contact us:  If you have any questions or concerns before your next appointment please send [place] a message through [place] or call our office at [phone].    TO LEAVE A MESSAGE FOR THE NURSE SELECT OPTION 2, PLEASE LEAVE A MESSAGE INCLUDING: YOUR NAME DATE OF BIRTH CALL BACK NUMBER REASON FOR CALL**this is important as we prioritize the call backs  YOU WILL RECEIVE A CALL BACK THE SAME DAY AS LONG AS YOU CALL BEFORE 4:00 PM

## 2021-11-19 ENCOUNTER — Encounter (HOSPITAL_COMMUNITY): Payer: Self-pay | Admitting: Cardiology

## 2021-11-19 ENCOUNTER — Other Ambulatory Visit (HOSPITAL_COMMUNITY): Payer: Self-pay

## 2021-11-19 MED ORDER — SPIRONOLACTONE 25 MG PO TABS
12.5000 mg | ORAL_TABLET | Freq: Every day | ORAL | 3 refills | Status: DC
Start: 2021-11-19 — End: 2021-12-08

## 2021-11-24 NOTE — Telephone Encounter (Signed)
Advanced Heart Failure Patient Advocate Encounter  Spoke with the patient's daughter. Will provide a month of samples while we wait on a determination from [employer]. Attempted to call [employer], had to hang up after 1.5 hour hold.

## 2021-11-25 ENCOUNTER — Encounter (HOSPITAL_COMMUNITY): Payer: Self-pay

## 2021-11-25 NOTE — Progress Notes (Unsigned)
Medication Samples have been provided to the patient.  Drug name: [name]       Strength: 24/26 mg        Qty: 1  LOT: [id number]  Exp.Date: 12/2023  Dosing instructions: Take 1 tablet Twice daily   The patient has been instructed regarding the correct time, dose, and frequency of taking this medication, including desired effects and most common side effects.   Juanita Laster Kayzen Kendzierski 7:51 AM 11/25/2021

## 2021-11-30 ENCOUNTER — Other Ambulatory Visit (HOSPITAL_COMMUNITY): Payer: Medicare Other

## 2021-12-01 ENCOUNTER — Ambulatory Visit (HOSPITAL_COMMUNITY)
Admission: RE | Admit: 2021-12-01 | Discharge: 2021-12-01 | Disposition: A | Discharge status: Home / Self Care | Payer: Medicare Other | Source: Ambulatory Visit | Attending: Cardiology | Admitting: Cardiology

## 2021-12-01 ENCOUNTER — Other Ambulatory Visit: Payer: Self-pay

## 2021-12-01 DIAGNOSIS — I428 Other cardiomyopathies: Secondary | ICD-10-CM | POA: Diagnosis not present

## 2021-12-01 LAB — CBC
HCT: 34.5 % — ABNORMAL LOW (ref 39.0–52.0)
Hemoglobin: 11.5 g/dL — ABNORMAL LOW (ref 13.0–17.0)
MCH: 29.6 pg (ref 26.0–34.0)
MCHC: 33.3 g/dL (ref 30.0–36.0)
MCV: 88.7 fL (ref 80.0–100.0)
Platelets: 205 10*3/uL (ref 150–400)
RBC: 3.89 MIL/uL — ABNORMAL LOW (ref 4.22–5.81)
RDW: 14.6 % (ref 11.5–15.5)
WBC: 5.4 10*3/uL (ref 4.0–10.5)
nRBC: 0 % (ref 0.0–0.2)

## 2021-12-01 LAB — BASIC METABOLIC PANEL
Anion gap: 9 (ref 5–15)
BUN: 21 mg/dL (ref 8–23)
CO2: 28 mmol/L (ref 22–32)
Calcium: 9.2 mg/dL (ref 8.9–10.3)
Chloride: 103 mmol/L (ref 98–111)
Creatinine, Ser: 1.2 mg/dL (ref 0.61–1.24)
GFR, Estimated: >60 mL/min (ref >60)
Glucose, Bld: 133 mg/dL — ABNORMAL HIGH (ref 70–99)
Potassium: 4.1 mmol/L (ref 3.5–5.1)
Sodium: 140 mmol/L (ref 135–145)

## 2021-12-07 ENCOUNTER — Other Ambulatory Visit (HOSPITAL_COMMUNITY): Payer: Medicare Other

## 2021-12-07 NOTE — Progress Notes (Incomplete)
***In Progress***    Advanced Heart Failure Clinic Note   PCP: [name], MD Cardiology: Dr. Caryl Comes HF Cardiology: Dr. Aundra Dubin  HPI:  81 y.o. with history of type 2 DM, HTN, CVA, paroxysmal atrial fibrillation, and chronic systolic CHF was referred by Dr. Caryl Comes for evaluation of CHF.  Patient is on Eliquis now for PAF, he had a CVA in 2018.  Echo at that time showed EF 55-60%.  In the summer of 2022, he developed progressive exertional dyspnea. He was seen in the ER and was noted to have mildly elevated HS-TnI without trend, thought to be due to volume overload from CHF.  Echo was done in 9/22 showing EF 25-30%, mild LVH, moderate RV enlargement with normal RV systolic function, PASP 65 mmHg, moderate TR, moderate AI, moderate MR, IVC dilated.  Cardiolite in 9/22 showed EF 27%, no ischemia.  Patient was started on Lasix due to volume overload and it was titrated up.  Patient does not drink ETOH, smoke, or use drugs.  He has no family history of heart disease.     RHC/LHC in 11/22 showed mild nonobstructive CAD, CI 4.36, optimized filling pressures.   He returned to clinic 11/17/21 for followup of CHF.  His right leg remains weak from stroke. He reported he fatigues easily.  No dyspnea walking on flat ground.  No lightheadedness with standing anymore.  In the past, he had orthostatic symptoms.  No orthopnea/PND.  He endorsed tingling in his feet and hands attributed to diabetic neuropathy.   Weight down 5 lbs.   Today he returns to HF clinic for pharmacist medication titration. At last visit with MD, metoprolol succinate was increased to 25 mg BID, spironolactone 12.5 mg daily was started, and potassium was stopped. Follow up BMET was stable. ***  Overall feeling ***. Dizziness, lightheadedness, fatigue:  Chest pain or palpitations:  How is your breathing?: *** SOB: Able to complete all ADLs. Activity level ***  Weight at home pounds. Takes furosemide/torsemide/bumex *** mg *** daily.   LEE PND/Orthopnea  Appetite *** Low-salt diet:   Physical Exam Cost/affordability of meds  HF Medications: Metoprolol succinate 25 mg BID Entresto 24-26 mg BID Spironolactone 12.5 mg daily Farxiga 10 mg daily Lasix 40 mg BID  Has the patient been experiencing any side effects to the medications prescribed?  {YES NO:22349}  Does the patient have any problems obtaining medications due to transportation or finances?   {YES NO:22349}  Understanding of regimen: {excellent/good/fair/poor:19665} Understanding of indications: {excellent/good/fair/poor:19665} Potential of compliance: {excellent/good/fair/poor:19665} Patient understands to avoid NSAIDs. Patient understands to avoid decongestants.   Pertinent Lab Values: 12/01/21: Serum creatinine 1.20, BUN 21, Potassium 4.1, Sodium 140  Vital Signs: Weight: *** (last clinic weight: 158 lb) Blood pressure: ***  Heart rate: ***   Assessment/Plan: 1. Chronic systolic CHF:  Echo in 8/84 with EF 25-30%, mild LVH, moderate RV enlargement with normal RV systolic function, PASP 65 mmHg, moderate TR, moderate AI, moderate MR, IVC dilated.  LHC/RHC in 11/22 showed mild nonobstructive CAD with optimized filling pressures and preserved cardiac output.  Cause of cardiomyopathy is uncertain.  No history of drugs/ETOH.  No family history of cardiomyopathy.  He does have some risk for cardiac amyloidosis with peripheral neuropathy and autonomic neuropathy (orthostatic hypotension) symptoms (alternatively could be due to diabetes).  On exam, he is *** not significantly volume overloaded.  NYHA class II.  - Increase Toprol XL to 25 mg BID.  - Continue Entresto 24/26 BID.   - Add spironolactone  12.5 mg QHS.  BMET today and in 10 days. Stop KCl.  - Continue Farxiga 10 mg daily.  - Continue Lasix 40 mg BID.   - Narrow QRS, not candidate for CRT.  With advanced age, probably will not aim for ICD.  - He is scheduled for cardiac MRI 12/20/21 to assess for  evidence of cardiac amyloidosis or myocarditis.  2. Atrial fibrillation: Paroxysmal.  History of CVA.  - Continue Eliquis.  3. Hyperlipidemia: Good lipids in 10/22.  - Continue atorvastatin .   Follow up ***   Audry Riles, PharmD, BCPS, BCCP, CPP Heart Failure Clinic Pharmacist [phone]

## 2021-12-08 ENCOUNTER — Ambulatory Visit (HOSPITAL_COMMUNITY)
Admission: RE | Admit: 2021-12-08 | Discharge: 2021-12-08 | Disposition: A | Discharge status: Home / Self Care | Payer: Medicare Other | Source: Ambulatory Visit | Attending: Cardiology | Admitting: Cardiology

## 2021-12-08 ENCOUNTER — Telehealth (HOSPITAL_COMMUNITY): Payer: Self-pay | Admitting: Pharmacist

## 2021-12-08 ENCOUNTER — Other Ambulatory Visit: Payer: Self-pay

## 2021-12-08 VITALS — BP 104/64 | HR 78 | Wt 154.2 lb

## 2021-12-08 DIAGNOSIS — I69341 Monoplegia of lower limb following cerebral infarction affecting right dominant side: Secondary | ICD-10-CM | POA: Diagnosis not present

## 2021-12-08 DIAGNOSIS — E785 Hyperlipidemia, unspecified: Secondary | ICD-10-CM | POA: Diagnosis not present

## 2021-12-08 DIAGNOSIS — I5022 Chronic systolic (congestive) heart failure: Secondary | ICD-10-CM | POA: Diagnosis not present

## 2021-12-08 DIAGNOSIS — Z7901 Long term (current) use of anticoagulants: Secondary | ICD-10-CM | POA: Diagnosis not present

## 2021-12-08 DIAGNOSIS — Z7984 Long term (current) use of oral hypoglycemic drugs: Secondary | ICD-10-CM | POA: Diagnosis not present

## 2021-12-08 DIAGNOSIS — Z79899 Other long term (current) drug therapy: Secondary | ICD-10-CM | POA: Insufficient documentation

## 2021-12-08 DIAGNOSIS — I110 Hypertensive heart disease with heart failure: Secondary | ICD-10-CM | POA: Diagnosis not present

## 2021-12-08 DIAGNOSIS — E1140 Type 2 diabetes mellitus with diabetic neuropathy, unspecified: Secondary | ICD-10-CM | POA: Diagnosis not present

## 2021-12-08 DIAGNOSIS — I480 Paroxysmal atrial fibrillation: Secondary | ICD-10-CM | POA: Diagnosis not present

## 2021-12-08 DIAGNOSIS — I2510 Atherosclerotic heart disease of native coronary artery without angina pectoris: Secondary | ICD-10-CM | POA: Diagnosis not present

## 2021-12-08 MED ORDER — SPIRONOLACTONE 25 MG PO TABS: 25.0000 mg | ORAL_TABLET | Freq: Every day | ORAL | 3 refills | Status: DC

## 2021-12-08 NOTE — Telephone Encounter (Signed)
Medication Samples have been provided to the patient.   Drug name: [name]     Strength: 24/26 mg      Qty: 1 bottle                 LOT: [id number] Exp.Date: 12/2023   Dosing instructions: 1 tablet twice daily   The patient has been instructed regarding the correct time, dose, and frequency of taking this medication, including desired effects and most common side effects.   Audry Riles, PharmD, BCPS, CPP Heart Failure Clinic Pharmacist [phone]

## 2021-12-08 NOTE — Patient Instructions (Addendum)
It was a pleasure seeing you today!  MEDICATIONS: -We are changing your medications today -Increase spironolactone to 25 mg (1 tablet) daily.  -Call if you have questions about your medications.   NEXT APPOINTMENT: Return to clinic in 4 weeks with Pharmacy Clinic on February 15th at 12pm.   In general, to take care of your heart failure: -Limit your fluid intake to 2 Liters (half-gallon) per day.   -Limit your salt intake to ideally 2-3 grams (2000-3000 mg) per day. -Weigh yourself daily and record, and bring that "weight diary" to your next appointment.  (Weight gain of 2-3 pounds in 1 day typically means fluid weight.) -The medications for your heart are to help your heart and help you live longer.   -Please contact us before stopping any of your heart medications.  Call the clinic at [phone] with questions or to reschedule future appointments.

## 2021-12-08 NOTE — Progress Notes (Signed)
Advanced Heart Failure Clinic Note   PCP: [name], MD Cardiology: Dr. Caryl Comes HF Cardiology: Dr. Aundra Dubin  HPI:  81 y.o. with history of type 2 DM, HTN, CVA, paroxysmal atrial fibrillation, and chronic systolic CHF was referred by Dr. Caryl Comes for evaluation of CHF.  Patient is on Eliquis now for PAF, he had a CVA in 2018.  Echo at that time showed EF 55-60%.  In the summer of 2022, he developed progressive exertional dyspnea. He was seen in the ER and was noted to have mildly elevated HS-TnI without trend, thought to be due to volume overload from CHF.  Echo was done in 07/2021 showing EF 25-30%, mild LVH, moderate RV enlargement with normal RV systolic function, PASP 65 mmHg, moderate TR, moderate AI, moderate MR, IVC dilated.  Cardiolite in 07/2021 showed EF 27%, no ischemia.  Patient was started on Lasix due to volume overload and it was titrated up.  Patient does not drink ETOH, smoke, or use drugs.  He has no family history of heart disease.     RHC/LHC in 09/2021 showed mild nonobstructive CAD, CI 4.36, optimized filling pressures.   He returned to clinic 11/17/21 for follow up of CHF.  His right leg remained weak from stroke. He reported he fatigues easily.  No dyspnea walking on flat ground.  No lightheadedness with standing anymore.  In the past, he had orthostatic symptoms.  No orthopnea/PND.  He endorsed tingling in his feet and hands attributed to diabetic neuropathy.  Weight was down 5 lbs.   Today he returns to HF clinic for pharmacist medication titration accompanied by his daughter Para. At last visit with MD, metoprolol succinate was increased to 25 mg BID, spironolactone 12.5 mg daily was started, and potassium was stopped. Follow up BMET was stable. Today he reports overall feeling well. Daughter notes that he is moving slower and is more tired than he has been in years past, likely due to stroke and advancing age. They would like him to continue seeing PT. Feels lightheaded  when he turns around too quickly but denies dizziness, chest pain, palpitations. No SOB/DOE. Trace edema in right leg on exam, none in left leg. This is normal for him. Completes all ADLs independently. Not volume overloaded on exam. Weight is normally around 156 lbs at home. He reports adherence to his medications. He manages his own medications which seems to be done correctly.   HF Medications: Metoprolol succinate 25 mg BID Entresto 24-26 mg BID Spironolactone 12.5 mg daily Farxiga 10 mg daily Lasix 40 mg BID  Has the patient been experiencing any side effects to the medications prescribed?  no  Does the patient have any problems obtaining medications due to transportation or finances?  He has one week remaining of Entresto and 1 month of [place]. Entresto patient assistance is still pending so provided with samples today. Discussed that for Eliquis patient assistance 4% of income must be spent on medication costs before can apply.   Understanding of regimen: excellent Understanding of indications: excellent Potential of compliance: excellent Patient understands to avoid NSAIDs. Patient understands to avoid decongestants.   Pertinent Lab Values: 12/01/21: Serum creatinine 1.20, BUN 21, Potassium 4.1, Sodium 140  Vital Signs: Weight: 154.2 lbs (last clinic weight: 158 lbs) Blood pressure: 104/64 Heart rate: 78   Assessment/Plan: 1. Chronic systolic CHF:  Echo in 8/52 with EF 25-30%, mild LVH, moderate RV enlargement with normal RV systolic function, PASP 65 mmHg, moderate TR, moderate AI, moderate MR,  IVC dilated.  LHC/RHC in 11/22 showed mild nonobstructive CAD with optimized filling pressures and preserved cardiac output.  Cause of cardiomyopathy is uncertain.  No history of drugs/ETOH.  No family history of cardiomyopathy.  He does have some risk for cardiac amyloidosis with peripheral neuropathy and autonomic neuropathy (orthostatic hypotension) symptoms (alternatively could be due  to diabetes).   - On exam, he is not significantly volume overloaded.  NYHA class II.  - Continue Lasix 40 mg BID.  - Continue metoprolol succinate 25 mg BID.  - Continue Entresto 24/26 mg BID.   - Increase spironolactone to 25 mg daily. BMET on 12/22/21 to coordinate with labs at the cancer center.  - Continue Farxiga 10 mg daily.  - Narrow QRS, not candidate for CRT.  With advanced age, probably will not aim for ICD.  - He is scheduled for cardiac MRI 12/20/21 to assess for evidence of cardiac amyloidosis or myocarditis.  2. Atrial fibrillation: Paroxysmal.  History of CVA.  - Continue Eliquis.  3. Hyperlipidemia: Good lipids in 08/2021.  - Continue atorvastatin .   Follow up in 2 weeks for BMET, 4 weeks for repeat pharmacy visit.    Audry Riles, PharmD, BCPS, BCCP, CPP Heart Failure Clinic Pharmacist [phone]

## 2021-12-09 NOTE — Telephone Encounter (Signed)
Advanced Heart Failure Patient Advocate Encounter  Called and left the patient's daughter a message regarding approval.  [name], CPhT

## 2021-12-09 NOTE — Telephone Encounter (Signed)
Received a fax from  [employer] regarding an approval for  Entresto  patient assistance from 12/08/21 to 11/20/22.   Document scanned to chart.  Phone number: [phone]

## 2021-12-17 ENCOUNTER — Telehealth (HOSPITAL_COMMUNITY): Payer: Self-pay | Admitting: Emergency Medicine

## 2021-12-17 NOTE — Telephone Encounter (Signed)
Reaching out to patient to offer assistance regarding upcoming cardiac imaging study; pt verbalizes understanding of appt date/time, parking situation and where to check in, and verified current allergies; name and call back number provided for further questions should they arise Marchia Bond RN [facility] [phone] office [phone] cell  Spoke with daughter Denies implants (loop recorder was removed) Denies claustro Denies iv issues Arrival 1:30pm

## 2021-12-20 ENCOUNTER — Other Ambulatory Visit: Payer: Self-pay

## 2021-12-20 ENCOUNTER — Other Ambulatory Visit (HOSPITAL_COMMUNITY): Payer: Medicare Other

## 2021-12-20 ENCOUNTER — Ambulatory Visit (HOSPITAL_COMMUNITY)
Admission: RE | Admit: 2021-12-20 | Discharge: 2021-12-20 | Disposition: A | Discharge status: Home / Self Care | Payer: Medicare Other | Source: Ambulatory Visit | Attending: Cardiology | Admitting: Cardiology

## 2021-12-20 DIAGNOSIS — I428 Other cardiomyopathies: Secondary | ICD-10-CM | POA: Diagnosis not present

## 2021-12-20 MED ORDER — GADOBUTROL 1 MMOL/ML IV SOLN
10.0000 mL | Freq: Once | INTRAVENOUS | Status: AC | PRN
  Administered 2021-12-20: 10 mL via INTRAVENOUS

## 2021-12-22 ENCOUNTER — Inpatient Hospital Stay: Payer: Medicare Other | Attending: Oncology

## 2021-12-22 ENCOUNTER — Other Ambulatory Visit: Payer: Self-pay

## 2021-12-22 DIAGNOSIS — Z7901 Long term (current) use of anticoagulants: Secondary | ICD-10-CM | POA: Diagnosis not present

## 2021-12-22 DIAGNOSIS — D472 Monoclonal gammopathy: Secondary | ICD-10-CM | POA: Insufficient documentation

## 2021-12-22 DIAGNOSIS — Z7984 Long term (current) use of oral hypoglycemic drugs: Secondary | ICD-10-CM | POA: Diagnosis not present

## 2021-12-22 DIAGNOSIS — Z79899 Other long term (current) drug therapy: Secondary | ICD-10-CM | POA: Insufficient documentation

## 2021-12-22 LAB — CBC WITH DIFFERENTIAL (CANCER CENTER ONLY)
Abs Immature Granulocytes: 0.01 10*3/uL (ref 0.00–0.07)
Basophils Absolute: 0 10*3/uL (ref 0.0–0.1)
Basophils Relative: 1 %
Eosinophils Absolute: 0.1 10*3/uL (ref 0.0–0.5)
Eosinophils Relative: 2 %
HCT: 35.5 % — ABNORMAL LOW (ref 39.0–52.0)
Hemoglobin: 12 g/dL — ABNORMAL LOW (ref 13.0–17.0)
Immature Granulocytes: 0 %
Lymphocytes Relative: 29 %
Lymphs Abs: 1.3 10*3/uL (ref 0.7–4.0)
MCH: 30.1 pg (ref 26.0–34.0)
MCHC: 33.8 g/dL (ref 30.0–36.0)
MCV: 89 fL (ref 80.0–100.0)
Monocytes Absolute: 0.4 10*3/uL (ref 0.1–1.0)
Monocytes Relative: 10 %
Neutro Abs: 2.6 10*3/uL (ref 1.7–7.7)
Neutrophils Relative %: 58 %
Platelet Count: 178 10*3/uL (ref 150–400)
RBC: 3.99 MIL/uL — ABNORMAL LOW (ref 4.22–5.81)
RDW: 14.1 % (ref 11.5–15.5)
WBC Count: 4.4 10*3/uL (ref 4.0–10.5)
nRBC: 0 % (ref 0.0–0.2)

## 2021-12-22 LAB — CMP (CANCER CENTER ONLY)
ALT: 19 U/L (ref 0–44)
AST: 20 U/L (ref 15–41)
Albumin: 4 g/dL (ref 3.5–5.0)
Alkaline Phosphatase: 56 U/L (ref 38–126)
Anion gap: 6 (ref 5–15)
BUN: 26 mg/dL — ABNORMAL HIGH (ref 8–23)
CO2: 32 mmol/L (ref 22–32)
Calcium: 9.7 mg/dL (ref 8.9–10.3)
Chloride: 104 mmol/L (ref 98–111)
Creatinine: 1.19 mg/dL (ref 0.61–1.24)
GFR, Estimated: >60 mL/min (ref >60)
Glucose, Bld: 151 mg/dL — ABNORMAL HIGH (ref 70–99)
Potassium: 4.1 mmol/L (ref 3.5–5.1)
Sodium: 142 mmol/L (ref 135–145)
Total Bilirubin: 0.6 mg/dL (ref 0.3–1.2)
Total Protein: 6.6 g/dL (ref 6.5–8.1)

## 2021-12-23 LAB — KAPPA/LAMBDA LIGHT CHAINS
Kappa free light chain: 17.7 mg/L (ref 3.3–19.4)
Kappa, lambda light chain ratio: 0.95 (ref 0.26–1.65)
Lambda free light chains: 18.6 mg/L (ref 5.7–26.3)

## 2021-12-24 ENCOUNTER — Encounter (HOSPITAL_COMMUNITY): Payer: Self-pay | Admitting: Cardiology

## 2021-12-27 LAB — MULTIPLE MYELOMA PANEL, SERUM
Albumin SerPl Elph-Mcnc: 3.7 g/dL (ref 2.9–4.4)
Albumin/Glob SerPl: 1.6 (ref 0.7–1.7)
Alpha 1: 0.2 g/dL (ref 0.0–0.4)
Alpha2 Glob SerPl Elph-Mcnc: 0.7 g/dL (ref 0.4–1.0)
B-Globulin SerPl Elph-Mcnc: 0.9 g/dL (ref 0.7–1.3)
Gamma Glob SerPl Elph-Mcnc: 0.7 g/dL (ref 0.4–1.8)
Globulin, Total: 2.4 g/dL (ref 2.2–3.9)
IgA: 272 mg/dL (ref 61–437)
IgG (Immunoglobin G), Serum: 700 mg/dL (ref 603–1613)
IgM (Immunoglobulin M), Srm: 13 mg/dL — ABNORMAL LOW (ref 15–143)
M Protein SerPl Elph-Mcnc: 0.2 g/dL — ABNORMAL HIGH
Total Protein ELP: 6.1 g/dL (ref 6.0–8.5)

## 2021-12-28 ENCOUNTER — Ambulatory Visit: Payer: Medicare Other | Admitting: Podiatry

## 2021-12-28 ENCOUNTER — Other Ambulatory Visit: Payer: Medicare Other

## 2021-12-29 ENCOUNTER — Encounter: Payer: Self-pay | Admitting: Podiatry

## 2021-12-29 ENCOUNTER — Inpatient Hospital Stay: Payer: Medicare Other | Admitting: Oncology

## 2021-12-29 ENCOUNTER — Other Ambulatory Visit: Payer: Self-pay

## 2021-12-29 ENCOUNTER — Ambulatory Visit (INDEPENDENT_AMBULATORY_CARE_PROVIDER_SITE_OTHER): Payer: Medicare Other

## 2021-12-29 ENCOUNTER — Ambulatory Visit (INDEPENDENT_AMBULATORY_CARE_PROVIDER_SITE_OTHER): Payer: Medicare Other | Admitting: Podiatry

## 2021-12-29 VITALS — BP 109/62 | HR 73 | Temp 97.8°F | Resp 16 | Ht 71.0 in | Wt 156.4 lb

## 2021-12-29 DIAGNOSIS — D472 Monoclonal gammopathy: Secondary | ICD-10-CM

## 2021-12-29 DIAGNOSIS — M79676 Pain in unspecified toe(s): Secondary | ICD-10-CM

## 2021-12-29 DIAGNOSIS — Z7984 Long term (current) use of oral hypoglycemic drugs: Secondary | ICD-10-CM | POA: Diagnosis not present

## 2021-12-29 DIAGNOSIS — E11622 Type 2 diabetes mellitus with other skin ulcer: Secondary | ICD-10-CM

## 2021-12-29 DIAGNOSIS — L89891 Pressure ulcer of other site, stage 1: Secondary | ICD-10-CM

## 2021-12-29 DIAGNOSIS — R52 Pain, unspecified: Secondary | ICD-10-CM

## 2021-12-29 DIAGNOSIS — L84 Corns and callosities: Secondary | ICD-10-CM

## 2021-12-29 DIAGNOSIS — Z79899 Other long term (current) drug therapy: Secondary | ICD-10-CM | POA: Diagnosis not present

## 2021-12-29 DIAGNOSIS — E1142 Type 2 diabetes mellitus with diabetic polyneuropathy: Secondary | ICD-10-CM | POA: Diagnosis not present

## 2021-12-29 DIAGNOSIS — L97309 Non-pressure chronic ulcer of unspecified ankle with unspecified severity: Secondary | ICD-10-CM | POA: Diagnosis not present

## 2021-12-29 DIAGNOSIS — Z7901 Long term (current) use of anticoagulants: Secondary | ICD-10-CM | POA: Diagnosis not present

## 2021-12-29 DIAGNOSIS — B351 Tinea unguium: Secondary | ICD-10-CM | POA: Diagnosis not present

## 2021-12-29 NOTE — Progress Notes (Signed)
Hematology and Oncology Follow Up Visit  Earl [name] [id number] 09-23-1941 81 y.o. 12/29/2021 10:27 AM Earl [name], MDMcKeown, [name], MD   Principle Diagnosis: 81 year old man with IgG kappa MGUS diagnosed in 2004.  He has no evidence of endorgan damage or multiple myeloma progression since that time.    Prior Therapy: Status post bone marrow biopsy on 02/14/2003 which showed 2% plasma cell involvement.  Current therapy: Active surveillance.   Interim History:  [name] returns today for a follow-up evaluation.  Since the last visit, he reports no major changes in his health.  He reports slight decline in his mobility and currently uses a walker occasionally pain without any recent falls or syncope.  He denies any hospitalizations or illnesses.  He denies any bone pain or pathological fractures.  His performance status quality of life remains unchanged.  He is eating better but does report loss of weight related to decrease in his lower extremity edema.   Medications: Reviewed without changes. Current Outpatient Medications  Medication Sig Dispense Refill   apixaban (ELIQUIS) 5 MG TABS tablet TAKE 1 TABLET BY MOUTH  TWICE DAILY TO PREVENT  BLOOD CLOTS 180 tablet 3   atorvastatin (LIPITOR) 40 MG tablet TAKE 1 TABLET BY MOUTH  DAILY FOR CHOLESTEROL 90 tablet 3   Blood Glucose Monitoring Suppl (ONE TOUCH ULTRA 2) w/Device KIT Check blood sugar 1 time a day-DX-E11.9 1 kit 0   Cholecalciferol (VITAMIN D PO) Take 1 tablet by mouth daily.      Cyanocobalamin (B-12) 50 MCG TABS Take 50 mcg by mouth daily.     dapagliflozin propanediol (FARXIGA) 10 MG TABS tablet Take 1 tablet (10 mg total) by mouth daily before breakfast. 30 tablet 11   DULoxetine (CYMBALTA) 20 MG capsule Take 1 cap daily for mood and pain. 90 capsule 3   ferrous sulfate 325 (65 FE) MG tablet Take 325 mg by mouth at bedtime.     finasteride (PROSCAR) 5 MG tablet TAKE 1 TABLET BY MOUTH  DAILY FOR PROSTATE 90 tablet  3   furosemide (LASIX) 80 MG tablet Take 0.5 tablets (40 mg total) by mouth every morning AND 0.5 tablets (40 mg total) every evening. 45 tablet 11   gabapentin (NEURONTIN) 300 MG capsule Take  1 to 2 capsules  1 hour before Bedtime  for Diabetic Neuropathy pains /Patient knows to take by mouth 180 capsule 3   glipiZIDE (GLUCOTROL) 5 MG tablet Take 1/2 tablet  2 x /day  with Meals  for Diabetes 90 tablet 3   Lancets (ONETOUCH DELICA PLUS [id number]) MISC CHECK BLOOD SUGAR ONCE A DAY. 100 each 3   Lancets Misc. (ONE TOUCH SURESOFT) MISC Check blood sugar 1 time a day-DX-E11.91 1 each 0   levothyroxine (SYNTHROID) 50 MCG tablet TAKE WHOLE TAB SUN/TUES/THURS, TAKE 1/2 TAB ALL OTHER DAYS. TAKE IN THE MORNING ON AN EMPTY STOMACH WITH ONLY WATER 30 MINUTES BEFORE BREAKFAST AND NO ANTACID MEDS, CALCIUM OR MAGNESIUM FOR 4 HOURS AND AVOID BIOTIN 90 tablet 3   Magnesium 250 MG TABS Take 250 mg by mouth daily.      metFORMIN (GLUCOPHAGE-XR) 500 MG 24 hr tablet Take  2 tablets  2 x /day  with Meals  for Diabetes                                                           /  TAKE 2 TABLETS BY MOUTH 360 tablet 3   metoprolol succinate (TOPROL XL) 25 MG 24 hr tablet Take 1 tablet (25 mg total) by mouth in the morning and at bedtime. 90 tablet 3   Multiple Vitamin (MULTIVITAMIN) capsule Take 1 capsule by mouth daily.     ONETOUCH ULTRA test strip CHECK BLOOD SUGAR ONCE A DAY. 100 strip 3   sacubitril-valsartan (ENTRESTO) 24-26 MG Take 1 tablet by mouth 2 (two) times daily. 180 tablet 3   spironolactone (ALDACTONE) 25 MG tablet Take 1 tablet (25 mg total) by mouth at bedtime. 90 tablet 3   triamcinolone ointment (KENALOG) 0.1 % Apply topically 2 (two) times daily. Apply to rash 2 to 3 x /day 80 g 1   trolamine salicylate (ASPERCREME) 10 % cream Apply 1 application topically daily as needed for muscle pain.     vitamin C (ASCORBIC ACID) 500 MG tablet Take 500 mg by mouth daily.     No  current facility-administered medications for this visit.     Allergies:  Allergies  Allergen Reactions   Penicillins Other (See Comments)    Did it involve swelling of the face/tongue/throat, SOB, or low BP? Unknown Did it involve sudden or severe rash/hives, skin peeling, or any reaction on the inside of your mouth or nose? Unknown Did you need to seek medical attention at a hospital or doctor's office? No When did it last happen?    Over 79 Years Ago   If all above answers are "NO", may proceed with cephalosporin use.        Physical Exam:   Blood pressure 109/62, pulse 73, temperature 97.8 F (36.6 C), temperature source Temporal, resp. rate 16, height _0  (1.803 m), weight 156 lb 6.4 oz (70.9 kg), SpO2 100 %.   ECOG: 1    General appearance: Alert, awake without any distress. Head: Atraumatic without abnormalities Oropharynx: Without any thrush or ulcers. Eyes: No scleral icterus. Lymph nodes: No lymphadenopathy noted in the cervical, supraclavicular, or axillary nodes Heart:regular rate and rhythm, without any murmurs or gallops.   Lung: Clear to auscultation without any rhonchi, wheezes or dullness to percussion. Abdomin: Soft, nontender without any shifting dullness or ascites. Musculoskeletal: No clubbing or cyanosis. Neurological: No motor or sensory deficits. Skin: No rashes or lesions.       Lab Results: Lab Results  Component Value Date   WBC 4.4 12/22/2021   HGB 12.0 (L) 12/22/2021   HCT 35.5 (L) 12/22/2021   MCV 89.0 12/22/2021   PLT 178 12/22/2021     Chemistry      Component Value Date/Time   NA 142 12/22/2021 1013   NA 142 09/22/2021 1516   NA 140 10/25/2017 0753   K 4.1 12/22/2021 1013   K 4.2 10/25/2017 0753   CL 104 12/22/2021 1013   CL 101 10/23/2012 0802   CO2 32 12/22/2021 1013   CO2 21 (L) 10/25/2017 0753   BUN 26 (H) 12/22/2021 1013   BUN 21 09/22/2021 1516   BUN 13.1 10/25/2017 0753   CREATININE 1.19 12/22/2021 1013    CREATININE 1.25 (H) 09/10/2021 1124   CREATININE 1.1 10/25/2017 0753      Component Value Date/Time   CALCIUM 9.7 12/22/2021 1013   CALCIUM 9.1 10/25/2017 0753   ALKPHOS 56 12/22/2021 1013   ALKPHOS 85 10/25/2017 0753   AST 20 12/22/2021 1013   AST 17 10/25/2017 0753   ALT 19 12/22/2021 1013   ALT 19 10/25/2017 0753  BILITOT 0.6 12/22/2021 1013   BILITOT 0.48 10/25/2017 0753         Latest Reference Range & Units 12/22/21 10:14  M Protein SerPl Elph-Mcnc Not Observed g/dL 0.2 (H) (C)  IFE 1  Comment ! (C)  Globulin, Total 2.2 - 3.9 g/dL 2.4 (C)  B-Globulin SerPl Elph-Mcnc 0.7 - 1.3 g/dL 0.9 (C)  IgG (Immunoglobin G), Serum 603 - 1,613 mg/dL 700  IgM (Immunoglobulin M), Srm 15 - 143 mg/dL 13 (L)  IgA 61 - 437 mg/dL 272     Impression and Plan:   81 year old man with:  1.  Monoclonal gammopathy diagnosed in 2004.  He was found to have IgG kappa without any evidence of endorgan damage.   He continues to be on active surveillance without any evidence of disease progression into symptomatic multiple myeloma.  Protein studies obtained on December 22, 2021 were reviewed and showed normal CBC and electrolytes including kidney function and calcium.  Protein studies continues to show very small M spike of 0.2 g/dL which has not changed over the years.  Quantitative immunoglobulins were showed a normal IgG and IgA level.  Based on these findings, I do not see any indication for treatment at this time.  I recommended continued active surveillance annually.  Risk of progression into active multiple myeloma remains close to 1 %/year.  He is agreeable with this plan.   2. Follow-up: He will return in 12 months for a follow-up visit.  30  minutes were dedicated to this encounter.  Time was spent on reviewing laboratory data, disease status update and outlining future plan review.  Zola Button, MD 2/8/202310:27 AM

## 2021-12-30 ENCOUNTER — Other Ambulatory Visit: Payer: Self-pay | Admitting: Podiatry

## 2021-12-30 DIAGNOSIS — L97309 Non-pressure chronic ulcer of unspecified ankle with unspecified severity: Secondary | ICD-10-CM

## 2022-01-04 NOTE — Progress Notes (Incomplete)
***In Progress***    Advanced Heart Failure Clinic Note   PCP: [name], MD Cardiology: Dr. Caryl Comes HF Cardiology: Dr. Aundra Dubin  HPI:  81 y.o. with history of type 2 DM, HTN, CVA, paroxysmal atrial fibrillation, and chronic systolic CHF was referred by Dr. Caryl Comes for evaluation of CHF.  Patient is on Eliquis now for PAF, he had a CVA in 2018.  Echo at that time showed EF 55-60%.  In the summer of 2022, he developed progressive exertional dyspnea. He was seen in the ER and was noted to have mildly elevated HS-TnI without trend, thought to be due to volume overload from CHF.  Echo was done in 07/2021 showing EF 25-30%, mild LVH, moderate RV enlargement with normal RV systolic function, PASP 65 mmHg, moderate TR, moderate AI, moderate MR, IVC dilated.  Cardiolite in 07/2021 showed EF 27%, no ischemia.  Patient was started on Lasix due to volume overload and it was titrated up.  Patient does not drink ETOH, smoke, or use drugs.  He has no family history of heart disease.     RHC/LHC in 09/2021 showed mild nonobstructive CAD, CI 4.36, optimized filling pressures.    He returned to clinic 11/17/21 for follow up of CHF.  His right leg remained weak from stroke. He reported he fatigues easily.  No dyspnea walking on flat ground.  No lightheadedness with standing anymore.  In the past, he had orthostatic symptoms.  No orthopnea/PND.  He endorsed tingling in his feet and hands attributed to diabetic neuropathy.  Weight was down 5 lbs.    He recently returned to HF clinic for medication titration. Reported overall feeling well. His daughter noted that he was moving slower and was more tired than he had been in years past, likely due to stroke and advancing age. Felt lightheaded when he turned around too quickly but denied dizziness, chest pain, palpitations. No SOB/DOE. Reported being able to complete all ADLs independently. Trace edema in right leg on exam, none in left leg, which was stable. Weight normally  around 156 lbs at home. Reported medication adherence. He manages his own medications which seemed to be done correctly.   Had cardiac MRI 12/20/21. ***   Today he returns to HF clinic for pharmacist medication titration. At last visit with PharmD, spironolactone was increased to 25 mg daily. Follow up BMET showed Scr and K stable/wnl.   Overall feeling ***. Dizziness, lightheadedness, fatigue:  Chest pain or palpitations:  How is your breathing?: *** SOB: Able to complete all ADLs. Activity level ***  Weight at home pounds. Takes furosemide/torsemide/bumex *** mg *** daily.  LEE PND/Orthopnea  Appetite *** Low-salt diet:   Physical Exam Cost/affordability of meds   HF Medications: Metoprolol succinate 25 mg BID Entresto 24-26 mg BID Spironolactone 25 mg daily Farxiga 10 mg daily Lasix 40 mg BID  Has the patient been experiencing any side effects to the medications prescribed?  {YES NO:22349}  Does the patient have any problems obtaining medications due to transportation or finances?   Yes - Maryan Puls through [employer] patient assistance (approved through 11/20/22). Charlton Haws through [employer] patient assistance (approved through 11/20/22).   Understanding of regimen: {excellent/good/fair/poor:19665} Understanding of indications: {excellent/good/fair/poor:19665} Potential of compliance: {excellent/good/fair/poor:19665} Patient understands to avoid NSAIDs. Patient understands to avoid decongestants.    Pertinent Lab Values: 12/22/21: Serum creatinine 1.19, BUN 26, Potassium 4.1, Sodium 142  Vital Signs: Weight: *** (last clinic weight: 154.2 lb) Blood pressure: ***  Heart rate: ***   Assessment/Plan:  1. Chronic systolic CHF:  Echo in 9/50 with EF 25-30%, mild LVH, moderate RV enlargement with normal RV systolic function, PASP 65 mmHg, moderate TR, moderate AI, moderate MR, IVC dilated.  LHC/RHC in 11/22 showed mild nonobstructive CAD with optimized filling  pressures and preserved cardiac output.  Cause of cardiomyopathy is uncertain.  No history of drugs/ETOH.  No family history of cardiomyopathy.  He does have some risk for cardiac amyloidosis with peripheral neuropathy and autonomic neuropathy (orthostatic hypotension) symptoms (alternatively could be due to diabetes).   - On exam, he is not significantly volume overloaded.  NYHA class II.  - Continue Lasix 40 mg BID.  - Continue metoprolol succinate 25 mg BID.  - Continue Entresto 24/26 mg BID.   - Increase spironolactone to 25 mg daily. BMET on 12/22/21 to coordinate with labs at the cancer center.  - Continue Farxiga 10 mg daily.  - Narrow QRS, not candidate for CRT.  With advanced age, probably will not aim for ICD.  - He is scheduled for cardiac MRI *** 12/20/21 to assess for evidence of cardiac amyloidosis or myocarditis.  2. Atrial fibrillation: Paroxysmal.  History of CVA.  - Continue Eliquis.  3. Hyperlipidemia: Good lipids in 08/2021.  - Continue atorvastatin .   Follow up ***   Audry Riles, PharmD, BCPS, BCCP, CPP Heart Failure Clinic Pharmacist [phone]

## 2022-01-04 NOTE — Progress Notes (Signed)
Subjective: Earl [name] is a 81 y.o. male patient seen today for follow up of  at risk foot care. Pt has h/o NIDDM with chronic kidney disease and callus(es) left hallux and painful thick toenails that are difficult to trim. Painful toenails interfere with ambulation. Aggravating factors include wearing enclosed shoe gear. Pain is relieved with periodic professional debridement. Painful calluses are aggravated when weightbearing with and without shoegear. Pain is relieved with periodic professional debridement..   Patient states their blood glucose was  123  mg/dl today.   New problem(s)/concern(s) today: None    Daughter is present during today's visit. Daughter states Dad wore his toe cap about two days.  PCP is [name], MD. Last visit was: 09/10/2021.  Allergies  Allergen Reactions   Penicillins Other (See Comments)    Did it involve swelling of the face/tongue/throat, SOB, or low BP? Unknown Did it involve sudden or severe rash/hives, skin peeling, or any reaction on the inside of your mouth or nose? Unknown Did you need to seek medical attention at a hospital or doctor's office? No When did it last happen?    Over 71 Years Ago   If all above answers are "NO", may proceed with cephalosporin use.      Objective: Physical Exam  General: Patient is a pleasant 81 y.o. African American male in NAD. AAO x 3.   Neurovascular Examination: Capillary refill time to digits immediate b/l. Palpable pedal pulses b/l LE. Pedal hair sparse. No pain with calf compression b/l. Lower extremity skin temperature gradient within normal limits. No edema noted b/l LE. No ischemia or gangrene noted b/l LE. No cyanosis or clubbing noted b/l LE.  Protective sensation intact 5/5 intact bilaterally with 10g monofilament b/l. Vibratory sensation intact b/l.  Dermatological:  Pedal skin is warm and supple b/l LE. No open wounds b/l LE. No interdigital macerations noted b/l LE. Toenails 1-5  bilaterally elongated, discolored, dystrophic, thickened, and crumbly with subungual debris and tenderness to dorsal palpation. Lamellar hyperkeratotic lesion noted distal tip of left hallux with subdermal hemorrhage No erythema, no edema, no drainage, no fluctuance.  Musculoskeletal:  Muscle strength 5/5 to all lower extremity muscle groups bilaterally. No pain, crepitus or joint limitation noted with ROM bilateral LE.  Assessment: 1. Pain due to onychomycosis of toenail   2. Pre-ulcerative calluses   3. Diabetic polyneuropathy associated with type 2 diabetes mellitus ([place])    Xray findings left foot: No gas in tissues left foot. No evidence of fracture left foot. No foreign body evident left foot.  Periosteal reaction noted tuft of distal phalanx of left great toe  Plan: Patient was evaluated and treated and all questions answered. Consent given for treatment as described below: -Xrays performed today left foot; will perform follow up xray next visit for comparison as I feel the reaction is from repetitive trauma.  -Mycotic toenails 1-5 bilaterally were debrided in length and girth with sterile nail nippers and dremel without incident. -Preulcerative lesion pared L hallux. Total number pared=1. -Continue digital toe cap left hallux to afftected digits daily for protection. -Patient/POA to call should there be question/concern in the interim.  Return in about 3 months (around 03/28/2022).  Marzetta Board, DPM

## 2022-01-05 ENCOUNTER — Other Ambulatory Visit: Payer: Self-pay

## 2022-01-05 ENCOUNTER — Ambulatory Visit (HOSPITAL_COMMUNITY)
Admission: RE | Admit: 2022-01-05 | Discharge: 2022-01-05 | Disposition: A | Discharge status: Home / Self Care | Payer: Medicare Other | Source: Ambulatory Visit | Attending: Internal Medicine | Admitting: Internal Medicine

## 2022-01-05 VITALS — BP 108/68 | HR 64 | Wt 153.0 lb

## 2022-01-05 DIAGNOSIS — Z79899 Other long term (current) drug therapy: Secondary | ICD-10-CM | POA: Insufficient documentation

## 2022-01-05 DIAGNOSIS — Z8673 Personal history of transient ischemic attack (TIA), and cerebral infarction without residual deficits: Secondary | ICD-10-CM | POA: Insufficient documentation

## 2022-01-05 DIAGNOSIS — I5022 Chronic systolic (congestive) heart failure: Secondary | ICD-10-CM | POA: Insufficient documentation

## 2022-01-05 DIAGNOSIS — Z7984 Long term (current) use of oral hypoglycemic drugs: Secondary | ICD-10-CM | POA: Insufficient documentation

## 2022-01-05 DIAGNOSIS — I480 Paroxysmal atrial fibrillation: Secondary | ICD-10-CM | POA: Diagnosis not present

## 2022-01-05 DIAGNOSIS — I2510 Atherosclerotic heart disease of native coronary artery without angina pectoris: Secondary | ICD-10-CM | POA: Diagnosis not present

## 2022-01-05 DIAGNOSIS — I110 Hypertensive heart disease with heart failure: Secondary | ICD-10-CM | POA: Insufficient documentation

## 2022-01-05 DIAGNOSIS — E118 Type 2 diabetes mellitus with unspecified complications: Secondary | ICD-10-CM | POA: Insufficient documentation

## 2022-01-05 DIAGNOSIS — E785 Hyperlipidemia, unspecified: Secondary | ICD-10-CM | POA: Insufficient documentation

## 2022-01-05 DIAGNOSIS — Z09 Encounter for follow-up examination after completed treatment for conditions other than malignant neoplasm: Secondary | ICD-10-CM | POA: Insufficient documentation

## 2022-01-05 DIAGNOSIS — Z7901 Long term (current) use of anticoagulants: Secondary | ICD-10-CM | POA: Insufficient documentation

## 2022-01-05 MED ORDER — METOPROLOL SUCCINATE ER 25 MG PO TB24: ORAL_TABLET | ORAL | 3 refills | Status: DC

## 2022-01-05 NOTE — Patient Instructions (Signed)
It was a pleasure seeing you today!  MEDICATIONS: -We are changing your medications today -Increase your metoprolol succinate to 25 mg (1 tablet) in the morning and 50 mg (2 tablets) in the evening.  -Try taking your second Lasix of the day earlier in the day.  -Call if you have questions about your medications.  NEXT APPOINTMENT: Return to clinic in 2 weeks with Dr. Aundra Dubin.  In general, to take care of your heart failure: -Limit your fluid intake to 2 Liters (half-gallon) per day.   -Limit your salt intake to ideally 2-3 grams (2000-3000 mg) per day. -Weigh yourself daily and record, and bring that "weight diary" to your next appointment.  (Weight gain of 2-3 pounds in 1 day typically means fluid weight.) -The medications for your heart are to help your heart and help you live longer.   -Please contact us before stopping any of your heart medications.  Call the clinic at [phone] with questions or to reschedule future appointments.

## 2022-01-05 NOTE — Progress Notes (Signed)
Advanced Heart Failure Clinic Note   PCP: [name], MD Cardiology: Dr. Caryl Comes HF Cardiology: Dr. Aundra Dubin  HPI:  81 y.o. with history of type 2 DM, HTN, CVA, paroxysmal atrial fibrillation, and chronic systolic CHF was referred by Dr. Caryl Comes for evaluation of CHF.  Patient is on Eliquis now for PAF, he had a CVA in 2018.  Echo at that time showed EF 55-60%.  In the summer of 2022, he developed progressive exertional dyspnea. He was seen in the ER and was noted to have mildly elevated HS-TnI without trend, thought to be due to volume overload from CHF.  Echo was done in 07/2021 showing EF 25-30%, mild LVH, moderate RV enlargement with normal RV systolic function, PASP 65 mmHg, moderate TR, moderate AI, moderate MR, IVC dilated.  Cardiolite in 07/2021 showed EF 27%, no ischemia.  Patient was started on Lasix due to volume overload and it was titrated up.  Patient does not drink ETOH, smoke, or use drugs.  He has no family history of heart disease.     RHC/LHC in 09/2021 showed mild nonobstructive CAD, CI 4.36, optimized filling pressures.    He returned to clinic 11/17/21 for follow up of CHF.  His right leg remained weak from stroke. He reported he fatigues easily.  No dyspnea walking on flat ground.  No lightheadedness with standing anymore.  In the past, he had orthostatic symptoms.  No orthopnea/PND.  He endorsed tingling in his feet and hands attributed to diabetic neuropathy.  Weight was down 5 lbs.    He recently returned to HF clinic for medication titration. Reported overall feeling well. His daughter noted that he was moving slower and was more tired than he had been in years past, likely due to stroke and advancing age. Felt lightheaded when he turned around too quickly but denied dizziness, chest pain, palpitations. No SOB/DOE. Reported being able to complete all ADLs independently. Trace edema in right leg on exam, none in left leg, which was stable. Weight normally around 156 lbs at  home. Reported medication adherence. He manages his own medications which seemed to be done correctly.   Cardiac MRI 12/20/21: Normal LV size and wall thickness, EF 45% with diffuse hypokinesis.   Today he returns to HF clinic for pharmacist medication titration. At last visit with pharmacy clinic, spironolactone was increased to 25 mg daily. Follow up BMET showed Scr and K stable/wnl. Reports overall feeling good. Still feels lightheaded when he turns around which is stable from last visit. Fatigued due to waking up 2-3x/night to urinate due to his evening Lasix dose (takes 40 mg BID). Denies CP, palpitations. Daily weight is stable at home, ~152 lbs. No SOB, PND, orthopnea. Trace edema in R leg, none in left. Appetite has been stable.   HF Medications: Metoprolol succinate 25 mg BID Entresto 24-26 mg BID Spironolactone 25 mg daily Farxiga 10 mg daily Lasix 40 mg BID  Has the patient been experiencing any side effects to the medications prescribed?  No  Does the patient have any problems obtaining medications due to transportation or finances?   Yes - Maryan Puls through [employer] patient assistance (approved through 11/20/22). Charlton Haws through [employer] patient assistance (approved through 11/20/22). Asks again about Eliquis patient assistance and reminded them that they must spend 3% of annual income on out of pocket prescription costs before they can apply.   Understanding of regimen: good Understanding of indications: good Potential of compliance: good Patient understands to avoid NSAIDs. Patient understands  to avoid decongestants.   Pertinent Lab Values: 12/22/21: Serum creatinine 1.19, BUN 26, Potassium 4.1, Sodium 142  Vital Signs: Weight: 153 lbs (last clinic weight: 154.2 lbs) Blood pressure: 108/68  Heart rate: 64   Assessment/Plan: 1. Chronic systolic CHF:  Echo in 5/28 with EF 25-30%, mild LVH, moderate RV enlargement with normal RV systolic function, PASP 65 mmHg,  moderate TR, moderate AI, moderate MR, IVC dilated.  LHC/RHC in 11/22 showed mild nonobstructive CAD with optimized filling pressures and preserved cardiac output.  Cause of cardiomyopathy is uncertain.  No history of drugs/ETOH.  No family history of cardiomyopathy.  He does have some risk for cardiac amyloidosis with peripheral neuropathy and autonomic neuropathy (orthostatic hypotension) symptoms (alternatively could be due to diabetes).  - On exam, he is not significantly volume overloaded.  NYHA class II.  - Continue Lasix 40 mg BID. Move second dose to earlier in the day to avoid nocturia.  - Increase metoprolol succinate to 25 mg qAM and 50 mg qPM.  - Continue Entresto 24/26 mg BID.   - Continue spironolactone 25 mg daily.  - Continue Farxiga 10 mg daily.  - Narrow QRS, not candidate for CRT.  With advanced age, probably will not aim for ICD.  2. Atrial fibrillation: Paroxysmal.  History of CVA.  - Continue Eliquis.  3. Hyperlipidemia: Good lipids in 08/2021.  - Continue atorvastatin.   Follow up 2 weeks with Dr. Rush Farmer, PharmD, BCPS, [facility], [place] Pharmacist [phone]

## 2022-01-13 ENCOUNTER — Telehealth: Payer: Self-pay | Admitting: Internal Medicine

## 2022-01-13 NOTE — Chronic Care Management (AMB) (Signed)
°  Chronic Care Management   Outreach Note  01/13/2022 Name: Earl [name] MRN: [id number] DOB: Dec 10, 1940  Referred by: [name], MD Reason for referral : No chief complaint on file.   An unsuccessful telephone outreach was attempted today. The patient was referred to the pharmacist for assistance with care management and care coordination.   Follow Up Plan:   Earl [name] Upstream Scheduler

## 2022-01-18 ENCOUNTER — Other Ambulatory Visit: Payer: Self-pay

## 2022-01-18 ENCOUNTER — Telehealth: Payer: Self-pay | Admitting: Internal Medicine

## 2022-01-18 ENCOUNTER — Ambulatory Visit (HOSPITAL_COMMUNITY)
Admission: RE | Admit: 2022-01-18 | Discharge: 2022-01-18 | Disposition: A | Discharge status: Home / Self Care | Payer: Medicare Other | Source: Ambulatory Visit | Attending: Cardiology | Admitting: Cardiology

## 2022-01-18 ENCOUNTER — Encounter (HOSPITAL_COMMUNITY): Payer: Self-pay | Admitting: Cardiology

## 2022-01-18 VITALS — BP 92/56 | HR 67 | Wt 155.0 lb

## 2022-01-18 DIAGNOSIS — E119 Type 2 diabetes mellitus without complications: Secondary | ICD-10-CM | POA: Diagnosis not present

## 2022-01-18 DIAGNOSIS — Z79899 Other long term (current) drug therapy: Secondary | ICD-10-CM | POA: Diagnosis not present

## 2022-01-18 DIAGNOSIS — I480 Paroxysmal atrial fibrillation: Secondary | ICD-10-CM | POA: Diagnosis not present

## 2022-01-18 DIAGNOSIS — E785 Hyperlipidemia, unspecified: Secondary | ICD-10-CM | POA: Insufficient documentation

## 2022-01-18 DIAGNOSIS — I5022 Chronic systolic (congestive) heart failure: Secondary | ICD-10-CM | POA: Diagnosis not present

## 2022-01-18 DIAGNOSIS — I5043 Acute on chronic combined systolic (congestive) and diastolic (congestive) heart failure: Secondary | ICD-10-CM

## 2022-01-18 DIAGNOSIS — Z7901 Long term (current) use of anticoagulants: Secondary | ICD-10-CM | POA: Insufficient documentation

## 2022-01-18 DIAGNOSIS — I2510 Atherosclerotic heart disease of native coronary artery without angina pectoris: Secondary | ICD-10-CM | POA: Diagnosis not present

## 2022-01-18 DIAGNOSIS — Z8673 Personal history of transient ischemic attack (TIA), and cerebral infarction without residual deficits: Secondary | ICD-10-CM | POA: Insufficient documentation

## 2022-01-18 DIAGNOSIS — I110 Hypertensive heart disease with heart failure: Secondary | ICD-10-CM | POA: Insufficient documentation

## 2022-01-18 LAB — BASIC METABOLIC PANEL
Anion gap: 10 (ref 5–15)
BUN: 26 mg/dL — ABNORMAL HIGH (ref 8–23)
CO2: 26 mmol/L (ref 22–32)
Calcium: 9.6 mg/dL (ref 8.9–10.3)
Chloride: 106 mmol/L (ref 98–111)
Creatinine, Ser: 1.28 mg/dL — ABNORMAL HIGH (ref 0.61–1.24)
GFR, Estimated: 57 mL/min — ABNORMAL LOW (ref >60)
Glucose, Bld: 111 mg/dL — ABNORMAL HIGH (ref 70–99)
Potassium: 4.2 mmol/L (ref 3.5–5.1)
Sodium: 142 mmol/L (ref 135–145)

## 2022-01-18 LAB — BRAIN NATRIURETIC PEPTIDE: B Natriuretic Peptide: 223.7 pg/mL — ABNORMAL HIGH (ref 0.0–100.0)

## 2022-01-18 MED ORDER — METOPROLOL SUCCINATE ER 50 MG PO TB24: 50.0000 mg | ORAL_TABLET | Freq: Every evening | ORAL | 3 refills | Status: DC

## 2022-01-18 NOTE — Chronic Care Management (AMB) (Signed)
°  Chronic Care Management   Outreach Note  01/18/2022 Name: Earl [name] MRN: [id number] DOB: 1941/02/08  Referred by: [name], MD Reason for referral : No chief complaint on file.   A second unsuccessful telephone outreach was attempted today. The patient was referred to pharmacist for assistance with care management and care coordination.  Follow Up Plan:   Tatjana Dellinger Upstream Scheduler

## 2022-01-18 NOTE — Progress Notes (Signed)
PCP: McKeown, William, MD °Cardiology: Dr. Klein °HF Cardiology: Dr. McLean ° °80 y.o. with history of type 2 DM, HTN, CVA, paroxysmal atrial fibrillation, and chronic systolic CHF was referred by Dr. Klein for evaluation of CHF.  Patient is on Eliquis now for PAF, he had a CVA in 2018.  Echo at that time showed EF 55-60%.  In the summer of 2022, he developed progressive exertional dyspnea. He was seen in the ER and was noted to have mildly elevate HS-TnI without trend, thought to be due to volume overload from CHF.  Echo was done in 9/22 showing EF 25-30%, mild LVH, moderate RV enlargement with normal RV systolic function, PASP 65 mmHg, moderate TR, moderate AI, moderate MR, IVC dilated.  Cardiolite in 9/22 showed EF 27%, no ischemia.  Patient was started on Lasix due to volume overload and it was titrated up.  Patient does not drink ETOH, smoke, or use drugs.  He has not family history of heart disease.   ° °RHC/LHC in 11/22 showed mild nonobstructive CAD, CI 4.36, optimized filling pressures.  Cardiac MRI in 1/23 showed LV EF 45%, mild RV dilation with RV EF 48%, mid-wall patchy LGE in the basal to mid septum, ECV 52% in septum, mild-moderate MR, mild-moderate AI.  ° °He returns for followup of CHF.  Right leg remains weak from stroke.  Uses a walker generally.  No dyspnea walking slowly on flat ground or slowly up stairs.  Weight down 3 lbs.  SBP 90s generally, he does get some lightheadedness with standing but no falls or syncope. No orthopnea/PND.  ° °Labs (10/22): LDL 44, TSH normal °Labs (11/22): K 4, creatinine 1.17 °Labs (12/22): K 4.3, creatinine 1.22 °Labs (2/23): K 4.1, creatinine 1.19, hgb 12, myeloma panel with IgG monoclonal protein.  ° °ECG (personally reviewed): NSR, left axis deviation, nonspecific T wave flattening ° °PMH: °1. Type 2 diabetes °2. HTN °3. Hyperlipidemia °4. Atrial fibrillation: Paroxysmal °5. 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  °6. GERD °7. Hypothyroidism °8. Chronic  systolic CHF: Echo (9/18) with EF 55-60%.  °- Echo (9/22): EF 25-30%, mild LVH, moderate RV enlargement with normal RV systolic function, PASP 65 mmHg, moderate TR, moderate AI, moderate MR, IVC dilated.  °- Cardiolite (9/22): EF 27%, no ischemia.  °- LHC/RHC (11/22): Mild nonobstructive CAD; mean RA 2, PA 41/7 mean 22, mean PCWP 11, CI 4.36 °- Cardiac MRI (1/23): LV EF 45%, mild RV dilation with RV EF 48%, mid-wall patchy LGE in the basal to mid septum, ECV 52% in septum, mild-moderate MR, mild-moderate AI.  °9. IgG kappa MGUS since 2004: Bone marrow biopsy 2004 with 2% plasma cells.  ° °Social History  ° °Socioeconomic History  ° Marital status: Widowed  °  Spouse name: Not on file  ° Number of children: 2  ° Years of education: Not on file  ° Highest education level: Not on file  °Occupational History  ° Occupation: Retired-Director of Human Resources  °  Employer: RETIRED  °Tobacco Use  ° Smoking status: Never  ° Smokeless tobacco: Never  °Vaping Use  ° Vaping Use: Never used  °Substance and Sexual Activity  ° Alcohol use: No  ° Drug use: No  ° Sexual activity: Not on file  °Other Topics Concern  ° Not on file  °Social History Narrative  ° Not on file  ° °Social Determinants of Health  ° °Financial Resource Strain: Not on file  °Food Insecurity: Not on file  °Transportation Needs: Not on   file  °Physical Activity: Not on file  °Stress: Not on file  °Social Connections: Not on file  °Intimate Partner Violence: Not on file  ° °Family History  °Problem Relation Age of Onset  ° Sickle cell anemia Mother   ° Diabetes Father   ° Stomach cancer Father   ° Colon cancer Father   ° CAD Neg Hx   ° Esophageal cancer Neg Hx   ° Rectal cancer Neg Hx   ° °ROS: All systems reviewed and negative except as per HPI.  ° °Current Outpatient Medications  °Medication Sig Dispense Refill  ° apixaban (ELIQUIS) 5 MG TABS tablet TAKE 1 TABLET BY MOUTH  TWICE DAILY TO PREVENT  BLOOD CLOTS 180 tablet 3  ° atorvastatin (LIPITOR) 40 MG tablet  TAKE 1 TABLET BY MOUTH  DAILY FOR CHOLESTEROL 90 tablet 3  ° Blood Glucose Monitoring Suppl (ONE TOUCH ULTRA 2) w/Device KIT Check blood sugar 1 time a day-DX-E11.9 1 kit 0  ° Cholecalciferol (VITAMIN D PO) Take 1 tablet by mouth daily.     ° Cyanocobalamin (B-12) 50 MCG TABS Take 50 mcg by mouth daily.    ° dapagliflozin propanediol (FARXIGA) 10 MG TABS tablet Take 1 tablet (10 mg total) by mouth daily before breakfast. 30 tablet 11  ° ferrous sulfate 325 (65 FE) MG tablet Take 325 mg by mouth at bedtime.    ° finasteride (PROSCAR) 5 MG tablet TAKE 1 TABLET BY MOUTH  DAILY FOR PROSTATE 90 tablet 3  ° furosemide (LASIX) 80 MG tablet Take 0.5 tablets (40 mg total) by mouth every morning AND 0.5 tablets (40 mg total) every evening. 45 tablet 11  ° gabapentin (NEURONTIN) 300 MG capsule Take  1 to 2 capsules  1 hour before Bedtime  for Diabetic Neuropathy pains /Patient knows to take by mouth 180 capsule 3  ° glipiZIDE (GLUCOTROL) 5 MG tablet Take 1/2 tablet  2 x /day  with Meals  for Diabetes 90 tablet 3  ° Lancets (ONETOUCH DELICA PLUS LANCET30G) MISC CHECK BLOOD SUGAR ONCE A DAY. 100 each 3  ° Lancets Misc. (ONE TOUCH SURESOFT) MISC Check blood sugar 1 time a day-DX-E11.91 1 each 0  ° levothyroxine (SYNTHROID) 50 MCG tablet TAKE WHOLE TAB SUN/TUES/THURS, TAKE 1/2 TAB ALL OTHER DAYS. TAKE IN THE MORNING ON AN EMPTY STOMACH WITH ONLY WATER 30 MINUTES BEFORE BREAKFAST AND NO ANTACID MEDS, CALCIUM OR MAGNESIUM FOR 4 HOURS AND AVOID BIOTIN 90 tablet 3  ° Magnesium 250 MG TABS Take 250 mg by mouth daily.     ° metFORMIN (GLUCOPHAGE-XR) 500 MG 24 hr tablet Take  2 tablets  2 x /day  with Meals  for Diabetes                                                           /                                  TAKE 2 TABLETS BY MOUTH 360 tablet 3  ° Multiple Vitamin (MULTIVITAMIN) capsule Take 1 capsule by mouth daily.    ° ONETOUCH ULTRA test strip CHECK BLOOD SUGAR ONCE A DAY. 100 strip 3  ° sacubitril-valsartan (ENTRESTO) 24-26 MG  Take 1 tablet   by mouth 2 (two) times daily. 180 tablet 3   spironolactone (ALDACTONE) 25 MG tablet Take 1 tablet (25 mg total) by mouth at bedtime. 90 tablet 3   triamcinolone ointment (KENALOG) 0.1 % Apply topically 2 (two) times daily. Apply to rash 2 to 3 x /day 80 g 1   trolamine salicylate (ASPERCREME) 10 % cream Apply 1 application topically daily as needed for muscle pain.     vitamin C (ASCORBIC ACID) 500 MG tablet Take 500 mg by mouth daily.     metoprolol succinate (TOPROL XL) 50 MG 24 hr tablet Take 1 tablet (50 mg total) by mouth at bedtime. Take 1 tablet (25 mg) in the morning and 2 tablets (50 mg) in the evening. 90 tablet 3   No current facility-administered medications for this encounter.   BP (!) 92/56    Pulse 67    Wt 70.3 kg (155 lb)    SpO2 99%    BMI 21.62 kg/m  General: NAD Neck: No JVD, no thyromegaly or thyroid nodule.  Lungs: Clear to auscultation bilaterally with normal respiratory effort. CV: Nondisplaced PMI.  Heart regular S1/S2, no S3/S4, 1/6 SEM RUSB.  1+ ankle edema.  No carotid bruit.  Normal pedal pulses.  Abdomen: Soft, nontender, no hepatosplenomegaly, no distention.  Skin: Intact without lesions or rashes.  Neurologic: Alert and oriented x 3.  Psych: Normal affect. Extremities: No clubbing or cyanosis.  HEENT: Normal.   Assessment/Plan: 1. Chronic systolic CHF:  Echo in 2/87 with EF 25-30%, mild LVH, moderate RV enlargement with normal RV systolic function, PASP 65 mmHg, moderate TR, moderate AI, moderate MR, IVC dilated.  LHC/RHC in 11/22 showed mild nonobstructive CAD with optimized filling pressures and preserved cardiac output.  Cause of cardiomyopathy is uncertain.  No history of drugs/ETOH.  No family history of cardiomyopathy.  He does have some risk for cardiac amyloidosis with peripheral neuropathy and autonomic neuropathy (orthostatic hypotension) symptoms (alternatively could be due to diabetes).  Cardiac MRI in 1/23 showed LV EF 45%, mild RV  dilation with RV EF 48%, mid-wall patchy LGE in the basal to mid septum, ECV 52% in septum, mild-moderate MR, mild-moderate AI.  Possible prior myocarditis as cause of CMP, but would also consider cardiac amyloidosis with ECV 52% though LGE is not classic for amyloidosis and LV walls not thickened. On exam, he is not significantly volume overloaded.  NYHA class II symptoms.  - Continue Entresto 24/26 bid, no BP room to increase.    - Continue Lasix 40 mg bid.  BMET/BNP today.  - Continue Farxiga 10 mg daily.  - With orthostatic symptoms, decrease Toprol XL to 50 mg qhs.   - Continue spironolactone 25 mg qhs.   - Narrow QRS, not candidate for CRT.  EF on cMRI out of range for ICD.   - cMRI has ECV 52% which is concerning for cardiac amyloidosis, though LGE pattern not classic for amyloidosis and LV walls not thickened.  Patient has history of MGUS with a very small M-spike, no change over a number of years, suspect not AL amyloidosis. I am going to arrange for PYP scan to assess for evidence for TTR cardiac amyloidosis.   2. Atrial fibrillation: Paroxysmal.  He is in NSR today. History of CVA.  - Continue Eliquis.  3. Hyperlipidemia: Good lipids in 10/22.  - Continue atorvastatin .   Followup 3 months with APP.   Loralie Champagne 01/18/2022

## 2022-01-18 NOTE — Patient Instructions (Signed)
Medication Changes:  Decrease your Toprol XL to 50 mg at bedtime  Lab Work:  Labs done today, your results will be available in MyChart, we will contact you for abnormal readings.   Testing/Procedures:  You have been ordered a PYP Scan.  This is done in the Radiology Department of [facility].  When you come for this test please plan to be there 2-3 hours. ONCE APPROVED BY INSURANCE IT WILL BE ARRANGED   Referrals:  NONE  Special Instructions // Education:  NONE  Follow-Up in: 3 MONTHS   At the [facility], you and your health needs are our priority. We have a designated team specialized in the treatment of Heart Failure. This Care Team includes your primary Heart Failure Specialized Cardiologist (physician), Advanced Practice Providers (APPs- Physician Assistants and Nurse Practitioners), and Pharmacist who all work together to provide you with the care you need, when you need it.   You may see any of the following providers on your designated Care Team at your next follow up:  Dr Glori Bickers Dr Haynes Kerns, NP Lyda Jester, [place] [facility] [place], [place] Audry Riles, PharmD   Please be sure to bring in all your medications bottles to every appointment.   Need to Contact us:  If you have any questions or concerns before your next appointment please send [place] a message through [place] or call our office at [phone].    TO LEAVE A MESSAGE FOR THE NURSE SELECT OPTION 2, PLEASE LEAVE A MESSAGE INCLUDING: YOUR NAME DATE OF BIRTH CALL BACK NUMBER REASON FOR CALL**this is important as we prioritize the call backs  YOU WILL RECEIVE A CALL BACK THE SAME DAY AS LONG AS YOU CALL BEFORE 4:00 PM

## 2022-01-20 ENCOUNTER — Telehealth (HOSPITAL_COMMUNITY): Payer: Self-pay | Admitting: *Deleted

## 2022-01-21 ENCOUNTER — Telehealth: Payer: Self-pay | Admitting: Internal Medicine

## 2022-01-21 NOTE — Chronic Care Management (AMB) (Signed)
?  Chronic Care Management  ? ?Outreach Note ? ?01/21/2022 ?Name: Earl [name] MRN: [id number] DOB: 1940/12/15 ? ?Referred by: [name], MD ?Reason for referral : No chief complaint on file. ? ? ?Third unsuccessful telephone outreach was attempted today. The patient was referred to the pharmacist for assistance with care management and care coordination.  ? ?Follow Up Plan:  ? ?Tatjana Dellinger ?Upstream Scheduler  ?

## 2022-01-31 ENCOUNTER — Telehealth: Payer: Self-pay | Admitting: Internal Medicine

## 2022-01-31 NOTE — Chronic Care Management (AMB) (Signed)
?  Chronic Care Management  ? ?Note ? ?01/31/2022 ?Name: Earl [name] MRN: [id number] DOB: 1941/09/01 ? ?Landrum Carbonell is a 81 y.o. year old male who is a primary care patient of [name], MD. I reached out to [employer] by phone today in response to a referral sent by [name] Sur's PCP, [name], MD.  ? ?Earl [name] was given information about Chronic Care Management services today including:  ?CCM service includes personalized support from designated clinical staff supervised by his physician, including individualized plan of care and coordination with other care providers ?24/7 contact phone numbers for assistance for urgent and routine care needs. ?Service will only be billed when office clinical staff spend 20 minutes or more in a month to coordinate care. ?Only one practitioner may furnish and bill the service in a calendar month. ?The patient may stop CCM services at any time (effective at the end of the month) by phone call to the office staff. ? ? ?PARA Chiara/DAUGHTER verbally agreed to assistance and services provided by embedded care coordination/care management team today. ? ?Follow up plan: ? ? ?Tatjana Dellinger ?Upstream Scheduler  ?

## 2022-03-03 ENCOUNTER — Other Ambulatory Visit (HOSPITAL_COMMUNITY): Payer: Self-pay

## 2022-03-03 MED ORDER — METOPROLOL SUCCINATE ER 50 MG PO TB24: 50.0000 mg | ORAL_TABLET | Freq: Every evening | ORAL | 0 refills | Status: DC

## 2022-03-06 ENCOUNTER — Other Ambulatory Visit: Payer: Self-pay | Admitting: Adult Health

## 2022-03-06 ENCOUNTER — Encounter (HOSPITAL_COMMUNITY): Payer: Self-pay

## 2022-03-06 DIAGNOSIS — E039 Hypothyroidism, unspecified: Secondary | ICD-10-CM

## 2022-03-10 ENCOUNTER — Other Ambulatory Visit: Payer: Self-pay | Admitting: Adult Health

## 2022-03-10 DIAGNOSIS — I480 Paroxysmal atrial fibrillation: Secondary | ICD-10-CM

## 2022-03-27 ENCOUNTER — Encounter (HOSPITAL_COMMUNITY): Payer: Self-pay | Admitting: Cardiology

## 2022-03-28 NOTE — Telephone Encounter (Signed)
Spoke with pts daughter appt scheduled.  ?

## 2022-03-29 ENCOUNTER — Ambulatory Visit (HOSPITAL_COMMUNITY)
Admission: RE | Admit: 2022-03-29 | Discharge: 2022-03-29 | Disposition: A | Discharge status: Home / Self Care | Payer: Medicare Other | Source: Ambulatory Visit | Attending: Family Medicine | Admitting: Family Medicine

## 2022-03-29 ENCOUNTER — Encounter (HOSPITAL_COMMUNITY): Payer: Self-pay

## 2022-03-29 VITALS — BP 94/54 | HR 74 | Wt 159.8 lb

## 2022-03-29 DIAGNOSIS — I110 Hypertensive heart disease with heart failure: Secondary | ICD-10-CM | POA: Insufficient documentation

## 2022-03-29 DIAGNOSIS — Z7901 Long term (current) use of anticoagulants: Secondary | ICD-10-CM | POA: Diagnosis not present

## 2022-03-29 DIAGNOSIS — I5022 Chronic systolic (congestive) heart failure: Secondary | ICD-10-CM

## 2022-03-29 DIAGNOSIS — I480 Paroxysmal atrial fibrillation: Secondary | ICD-10-CM

## 2022-03-29 DIAGNOSIS — Z8673 Personal history of transient ischemic attack (TIA), and cerebral infarction without residual deficits: Secondary | ICD-10-CM | POA: Insufficient documentation

## 2022-03-29 DIAGNOSIS — I428 Other cardiomyopathies: Secondary | ICD-10-CM

## 2022-03-29 DIAGNOSIS — I2510 Atherosclerotic heart disease of native coronary artery without angina pectoris: Secondary | ICD-10-CM | POA: Insufficient documentation

## 2022-03-29 DIAGNOSIS — E785 Hyperlipidemia, unspecified: Secondary | ICD-10-CM | POA: Diagnosis not present

## 2022-03-29 DIAGNOSIS — Z7984 Long term (current) use of oral hypoglycemic drugs: Secondary | ICD-10-CM | POA: Diagnosis not present

## 2022-03-29 DIAGNOSIS — I429 Cardiomyopathy, unspecified: Secondary | ICD-10-CM | POA: Insufficient documentation

## 2022-03-29 DIAGNOSIS — Z79899 Other long term (current) drug therapy: Secondary | ICD-10-CM | POA: Insufficient documentation

## 2022-03-29 LAB — COMPREHENSIVE METABOLIC PANEL
ALT: 31 U/L (ref 0–44)
AST: 29 U/L (ref 15–41)
Albumin: 3.5 g/dL (ref 3.5–5.0)
Alkaline Phosphatase: 45 U/L (ref 38–126)
Anion gap: 8 (ref 5–15)
BUN: 27 mg/dL — ABNORMAL HIGH (ref 8–23)
CO2: 24 mmol/L (ref 22–32)
Calcium: 9 mg/dL (ref 8.9–10.3)
Chloride: 106 mmol/L (ref 98–111)
Creatinine, Ser: 1.11 mg/dL (ref 0.61–1.24)
GFR, Estimated: >60 mL/min (ref >60)
Glucose, Bld: 99 mg/dL (ref 70–99)
Potassium: 4.1 mmol/L (ref 3.5–5.1)
Sodium: 138 mmol/L (ref 135–145)
Total Bilirubin: 0.9 mg/dL (ref 0.3–1.2)
Total Protein: 6 g/dL — ABNORMAL LOW (ref 6.5–8.1)

## 2022-03-29 LAB — BRAIN NATRIURETIC PEPTIDE: B Natriuretic Peptide: 225.5 pg/mL — ABNORMAL HIGH (ref 0.0–100.0)

## 2022-03-29 MED ORDER — FUROSEMIDE 80 MG PO TABS: ORAL_TABLET | ORAL | 11 refills | Status: DC

## 2022-03-29 NOTE — Progress Notes (Signed)
ReDS Vest / Clip - 03/29/22 1200   ? ?  ? ReDS Vest / Clip  ? Station Marker C   ? Ruler Value 27   ? ReDS Value Range Low volume   ? ReDS Actual Value 34   ? ?  ?  ? ?  ? ? ?

## 2022-03-29 NOTE — Patient Instructions (Signed)
INCREASE Lasix to 80 mg in the AM and 40 mg in the PM ? ?Labs today ?We will only contact you if something comes back abnormal or we need to make some changes. ?Otherwise no news is good news! ? ?Labs needed in 10 days ? ?Your physician recommends that you schedule a follow-up appointment in: 3-4 months with Dr Aundra Dubin ? ? ?Do the following things EVERYDAY: ?Weigh yourself in the morning before breakfast. Write it down and keep it in a log. ?Take your medicines as prescribed ?Eat low salt foods--Limit salt (sodium) to 2000 mg per day.  ?Stay as active as you can everyday ?Limit all fluids for the day to less than 2 liters ? ?At the [facility], you and your health needs are our priority. As part of our continuing mission to provide you with exceptional heart care, we have created designated Provider Care Teams. These Care Teams include your primary Cardiologist (physician) and Advanced Practice Providers (APPs- Physician Assistants and Nurse Practitioners) who all work together to provide you with the care you need, when you need it.  ? ?You may see any of the following providers on your designated Care Team at your next follow up: ?Dr Glori Bickers ?Dr Loralie Champagne ?Darrick Grinder, NP ?Lyda Jester, PA ?Jessica Milford,NP ?Marlyce Huge, PA ?Audry Riles, PharmD ? ? ?Please be sure to bring in all your medications bottles to every appointment.  ? ? ?

## 2022-03-29 NOTE — Progress Notes (Signed)
PCP: [name], MD ?Cardiology: Dr. Caryl Comes ?HF Cardiology: Dr. Aundra Dubin ? ?81 y.o. with history of type 2 DM, HTN, CVA, paroxysmal atrial fibrillation, and chronic systolic CHF was referred by Dr. Caryl Comes for evaluation of CHF.  Patient is on Eliquis now for PAF, he had a CVA in 2018.  Echo at that time showed EF 55-60%.  In the summer of 2022, he developed progressive exertional dyspnea. He was seen in the ER and was noted to have mildly elevate HS-TnI without trend, thought to be due to volume overload from CHF.  Echo was done in 9/22 showing EF 25-30%, mild LVH, moderate RV enlargement with normal RV systolic function, PASP 65 mmHg, moderate TR, moderate AI, moderate MR, IVC dilated.  Cardiolite in 9/22 showed EF 27%, no ischemia.  Patient was started on Lasix due to volume overload and it was titrated up.  Patient does not drink ETOH, smoke, or use drugs.  He has no family history of heart disease.   ? ?RHC/LHC in 11/22 showed mild nonobstructive CAD, CI 4.36, optimized filling pressures.  Cardiac MRI in 1/23 showed LV EF 45%, mild RV dilation with RV EF 48%, mid-wall patchy LGE in the basal to mid septum, ECV 52% in septum, mild-moderate MR, mild-moderate AI.  ? ?Follow up 2/23, stable NYHA II symptoms, BP low with some lightheadedness. Toprol decreased to 50 mg q hs with orthostatic symptoms, and arranged for PYP scan. ? ?Today he returns for an acute visit with complaints of increase LE swelling, here with his daughter. He has mild dyspnea walking on flat ground, continues to struggle with balance since CVA. Denies palpitations, CP, dizziness, abnormal bleeding or PND/Orthopnea. Appetite ok. No fever or chills. Weight at home 153-154 pounds. Taking all medications consistently about "90% of the time".   ? ?ReDs: 34% ? ?Labs (10/22): LDL 44, TSH normal ?Labs (11/22): K 4, creatinine 1.17 ?Labs (12/22): K 4.3, creatinine 1.22 ?Labs (2/23): K 4.1, creatinine 1.19, hgb 12, myeloma panel with IgG monoclonal  protein.  ? ?PMH: ?1. Type 2 diabetes ?2. HTN ?3. Hyperlipidemia ?4. Atrial fibrillation: Paroxysmal ?5. 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  ?6. GERD ?7. Hypothyroidism ?8. Chronic systolic CHF: Echo (6/20) with EF 55-60%.  ?- Echo (9/22): EF 25-30%, mild LVH, moderate RV enlargement with normal RV systolic function, PASP 65 mmHg, moderate TR, moderate AI, moderate MR, IVC dilated.  ?- Cardiolite (9/22): EF 27%, no ischemia.  ?- LHC/RHC (11/22): Mild nonobstructive CAD; mean RA 2, PA 41/7 mean 22, mean PCWP 11, CI 4.36 ?- Cardiac MRI (1/23): LV EF 45%, mild RV dilation with RV EF 48%, mid-wall patchy LGE in the basal to mid septum, ECV 52% in septum, mild-moderate MR, mild-moderate AI.  ?9. IgG kappa MGUS since 2004: Bone marrow biopsy 2004 with 2% plasma cells.  ? ?Social History  ? ?Socioeconomic History  ? Marital status: Widowed  ?  Spouse name: Not on file  ? Number of children: 2  ? Years of education: Not on file  ? Highest education level: Not on file  ?Occupational History  ? Occupation: Chiropodist  ?  Employer: RETIRED  ?Tobacco Use  ? Smoking status: Never  ? Smokeless tobacco: Never  ?Vaping Use  ? Vaping Use: Never used  ?Substance and Sexual Activity  ? Alcohol use: No  ? Drug use: No  ? Sexual activity: Not on file  ?Other Topics Concern  ? Not on file  ?Social History Narrative  ? Not  on file  ? ?Social Determinants of Health  ? ?Financial Resource Strain: Not on file  ?Food Insecurity: Not on file  ?Transportation Needs: Not on file  ?Physical Activity: Not on file  ?Stress: Not on file  ?Social Connections: Not on file  ?Intimate Partner Violence: Not on file  ? ?Family History  ?Problem Relation Age of Onset  ? Sickle cell anemia Mother   ? Diabetes Father   ? Stomach cancer Father   ? Colon cancer Father   ? CAD Neg Hx   ? Esophageal cancer Neg Hx   ? Rectal cancer Neg Hx   ? ?ROS: All systems reviewed and negative except as per HPI.  ? ?Current Outpatient  Medications  ?Medication Sig Dispense Refill  ? atorvastatin (LIPITOR) 40 MG tablet TAKE 1 TABLET BY MOUTH  DAILY FOR CHOLESTEROL 90 tablet 3  ? Blood Glucose Monitoring Suppl (ONE TOUCH ULTRA 2) w/Device KIT Check blood sugar 1 time a day-DX-E11.9 1 kit 0  ? Cholecalciferol (VITAMIN D PO) Take 1 tablet by mouth daily.     ? Cyanocobalamin (B-12) 50 MCG TABS Take 50 mcg by mouth daily.    ? dapagliflozin propanediol (FARXIGA) 10 MG TABS tablet Take 1 tablet (10 mg total) by mouth daily before breakfast. 30 tablet 11  ? ELIQUIS 5 MG TABS tablet TAKE 1 TABLET BY MOUTH  TWICE DAILY TO PREVENT  BLOOD CLOTS 180 tablet 3  ? ferrous sulfate 325 (65 FE) MG tablet Take 325 mg by mouth at bedtime.    ? finasteride (PROSCAR) 5 MG tablet TAKE 1 TABLET BY MOUTH  DAILY FOR PROSTATE 90 tablet 3  ? furosemide (LASIX) 80 MG tablet Take 0.5 tablets (40 mg total) by mouth every morning AND 0.5 tablets (40 mg total) every evening. 45 tablet 11  ? gabapentin (NEURONTIN) 300 MG capsule Take  1 to 2 capsules  1 hour before Bedtime  for Diabetic Neuropathy pains /Patient knows to take by mouth 180 capsule 3  ? glipiZIDE (GLUCOTROL) 5 MG tablet Take 1/2 tablet  2 x /day  with Meals  for Diabetes 90 tablet 3  ? Lancets (ONETOUCH DELICA PLUS [id number]) MISC CHECK BLOOD SUGAR ONCE A DAY. 100 each 3  ? Lancets Misc. (ONE TOUCH SURESOFT) MISC Check blood sugar 1 time a day-DX-E11.91 1 each 0  ? levothyroxine (SYNTHROID) 50 MCG tablet Take  1 tablet  Daily  on an empty stomach with only water for 30 minutes & no Antacid meds, Calcium or Magnesium for 4 hours & avoid Biotin                                          /                              TAKE                      BY                          MOUTH 90 tablet 3  ? Magnesium 250 MG TABS Take 250 mg by mouth daily.     ? metFORMIN (GLUCOPHAGE-XR) 500 MG 24 hr tablet Take  2 tablets  2 x /day  with Meals  for Diabetes                                                           /  TAKE 2 TABLETS BY MOUTH 360 tablet 3  ? metoprolol succinate (TOPROL XL) 50 MG 24 hr tablet Take 1 tablet (50 mg total) by mouth at bedtime. 90 tablet 0  ? Multiple Vitamin (MULTIVITAMIN) capsule Take 1 capsule by mouth daily.    ? ONETOUCH ULTRA test strip CHECK BLOOD SUGAR ONCE A DAY. 100 strip 3  ? sacubitril-valsartan (ENTRESTO) 24-26 MG Take 1 tablet by mouth 2 (two) times daily. 180 tablet 3  ? spironolactone (ALDACTONE) 25 MG tablet Take 1 tablet (25 mg total) by mouth at bedtime. 90 tablet 3  ? triamcinolone ointment (KENALOG) 0.1 % Apply topically 2 (two) times daily. Apply to rash 2 to 3 x /day (Patient taking differently: Apply topically 2 (two) times daily. Apply to rash 2 to 3 x /day as needed) 80 g 1  ? trolamine salicylate (ASPERCREME) 10 % cream Apply 1 application topically daily as needed for muscle pain.    ? vitamin C (ASCORBIC ACID) 500 MG tablet Take 500 mg by mouth daily.    ? ?No current facility-administered medications for this encounter.  ? ?Wt Readings from Last 3 Encounters:  ?03/29/22 72.5 kg (159 lb 12.8 oz)  ?01/18/22 70.3 kg (155 lb)  ?01/05/22 69.4 kg (153 lb)  ? ?BP (!) 94/54   Pulse 74   Wt 72.5 kg (159 lb 12.8 oz)   SpO2 98%   BMI 22.29 kg/m?  ?General:  NAD. No resp difficulty, arrived in [facility], elderly ?HEENT: Normal ?Neck: Supple. No JVD. Carotids 2+ bilat; no bruits. No lymphadenopathy or thryomegaly appreciated. ?Cor: PMI nondisplaced. Regular rate & rhythm. No rubs, gallops or murmurs. ?Lungs: Clear ?Abdomen: Soft, nontender, nondistended. No hepatosplenomegaly. No bruits or masses. Good bowel sounds. ?Extremities: No cyanosis, clubbing, rash,1+ BLE edema to knees, compression hose on ?Neuro: Alert & oriented x 3, cranial nerves grossly intact. Moves all 4 extremities, RLL weak. Affect pleasant. ? ?Assessment/Plan: ?1. Chronic systolic CHF:  Echo in 6/71 with EF 25-30%, mild LVH, moderate RV enlargement with normal RV systolic function, PASP 65 mmHg, moderate  TR, moderate AI, moderate MR, IVC dilated.  LHC/RHC in 11/22 showed mild nonobstructive CAD with optimized filling pressures and preserved cardiac output.  Cause of cardiomyopathy is uncertain.  No history of drugs

## 2022-03-30 ENCOUNTER — Telehealth (HOSPITAL_COMMUNITY): Payer: Self-pay | Admitting: Cardiology

## 2022-03-30 ENCOUNTER — Encounter: Payer: Self-pay | Admitting: Podiatry

## 2022-03-30 ENCOUNTER — Ambulatory Visit (INDEPENDENT_AMBULATORY_CARE_PROVIDER_SITE_OTHER): Payer: Medicare Other | Admitting: Podiatry

## 2022-03-30 DIAGNOSIS — M79675 Pain in left toe(s): Secondary | ICD-10-CM

## 2022-03-30 DIAGNOSIS — B351 Tinea unguium: Secondary | ICD-10-CM | POA: Diagnosis not present

## 2022-03-30 DIAGNOSIS — L84 Corns and callosities: Secondary | ICD-10-CM

## 2022-03-30 DIAGNOSIS — E1142 Type 2 diabetes mellitus with diabetic polyneuropathy: Secondary | ICD-10-CM

## 2022-03-30 DIAGNOSIS — M79674 Pain in right toe(s): Secondary | ICD-10-CM | POA: Diagnosis not present

## 2022-03-30 MED ORDER — FUROSEMIDE 40 MG PO TABS: 40.0000 mg | ORAL_TABLET | Freq: Two times a day (BID) | ORAL | 11 refills | Status: DC

## 2022-03-30 NOTE — Telephone Encounter (Signed)
-----   Message from Rafael Bihari, [place] sent at 03/29/2022  2:18 PM EDT ----- ?Labs ok. ? ?Please only increase Lasix to 80 mg q am/40 mg q pm x 3 days only, then back to 40 bid. He has repeat labs arranged. ?

## 2022-03-30 NOTE — Patient Instructions (Signed)
Apply Betadine Solution to tip of left great toe once daily. ?

## 2022-03-30 NOTE — Telephone Encounter (Signed)
Patient called.  Patient aware via daughter  

## 2022-04-03 ENCOUNTER — Encounter (HOSPITAL_COMMUNITY): Payer: Self-pay | Admitting: Cardiology

## 2022-04-03 DIAGNOSIS — I5022 Chronic systolic (congestive) heart failure: Secondary | ICD-10-CM

## 2022-04-04 ENCOUNTER — Telehealth (HOSPITAL_COMMUNITY): Payer: Self-pay | Admitting: *Deleted

## 2022-04-04 ENCOUNTER — Encounter (HOSPITAL_COMMUNITY): Payer: Self-pay | Admitting: Cardiology

## 2022-04-06 ENCOUNTER — Other Ambulatory Visit (HOSPITAL_COMMUNITY): Payer: Self-pay | Admitting: *Deleted

## 2022-04-06 ENCOUNTER — Ambulatory Visit (HOSPITAL_COMMUNITY)
Admission: RE | Admit: 2022-04-06 | Discharge: 2022-04-06 | Disposition: A | Discharge status: Home / Self Care | Payer: Medicare Other | Source: Ambulatory Visit | Attending: Internal Medicine | Admitting: Internal Medicine

## 2022-04-06 DIAGNOSIS — I5022 Chronic systolic (congestive) heart failure: Secondary | ICD-10-CM | POA: Diagnosis not present

## 2022-04-06 LAB — BASIC METABOLIC PANEL
Anion gap: 8 (ref 5–15)
BUN: 25 mg/dL — ABNORMAL HIGH (ref 8–23)
CO2: 26 mmol/L (ref 22–32)
Calcium: 9.3 mg/dL (ref 8.9–10.3)
Chloride: 105 mmol/L (ref 98–111)
Creatinine, Ser: 1.18 mg/dL (ref 0.61–1.24)
GFR, Estimated: >60 mL/min (ref >60)
Glucose, Bld: 70 mg/dL (ref 70–99)
Potassium: 4.3 mmol/L (ref 3.5–5.1)
Sodium: 139 mmol/L (ref 135–145)

## 2022-04-06 MED ORDER — DAPAGLIFLOZIN PROPANEDIOL 10 MG PO TABS: 10.0000 mg | ORAL_TABLET | Freq: Every day | ORAL | 3 refills | Status: DC

## 2022-04-07 ENCOUNTER — Other Ambulatory Visit (HOSPITAL_COMMUNITY): Payer: Medicare Other

## 2022-04-09 NOTE — Progress Notes (Signed)
  Subjective:  Patient ID: Earl [name], male    DOB: [birth year],  MRN: [id number]  Delos Klich presents to clinic today for at risk foot care with history of diabetic neuropathy, painful elongated mycotic toenails 1-5 bilaterally which are tender when wearing enclosed shoe gear. Pain is relieved with periodic professional debridement., and preulcerative lesion(s) left lower extremity.  Patient states blood glucose was 130 mg/dl today.  Last known HgA1c was 6.7%.  He continues to have preulcerative lesion of left hallux. Daughter thinks it is due to patient's gait when walking in shoes as he shuffles when he walks.  New problem(s): None.   PCP is [name], MD , and last visit was three months ago.  Allergies  Allergen Reactions   Penicillins Other (See Comments)    Did it involve swelling of the face/tongue/throat, SOB, or low BP? Unknown Did it involve sudden or severe rash/hives, skin peeling, or any reaction on the inside of your mouth or nose? Unknown Did you need to seek medical attention at a hospital or doctor's office? No When did it last happen?    Over 13 Years Ago   If all above answers are "NO", may proceed with cephalosporin use.     Review of Systems: Negative except as noted in the HPI.  Objective: No changes noted in today's physical examination. General: Patient is a pleasant 81 y.o. African American male in NAD. AAO x 3.   Neurovascular Examination: Capillary refill time to digits immediate b/l. Palpable pedal pulses b/l LE. Pedal hair sparse. No pain with calf compression b/l. Lower extremity skin temperature gradient within normal limits. No edema noted b/l LE. No ischemia or gangrene noted b/l LE. No cyanosis or clubbing noted b/l LE.  Protective sensation intact 5/5 intact bilaterally with 10g monofilament b/l. Vibratory sensation intact b/l.  Dermatological:  Pedal skin is warm and supple b/l LE. No open wounds b/l LE. No interdigital  macerations noted b/l LE. Toenails 1-5 bilaterally elongated, discolored, dystrophic, thickened, and crumbly with subungual debris and tenderness to dorsal palpation. Lamellar hyperkeratotic lesion noted distal tip of left hallux with subdermal hemorrhage No erythema, no edema, no drainage, no fluctuance.  Musculoskeletal:  Muscle strength 5/5 to all lower extremity muscle groups bilaterally. No pain, crepitus or joint limitation noted with ROM bilateral LE.     Latest Ref Rng & Units 09/10/2021   11:24 AM 05/17/2021   12:08 PM  Hemoglobin A1C  Hemoglobin-A1c <5.7 % of total Hgb 6.0   5.9     Assessment/Plan: 1. Pain due to onychomycosis of toenail   2. Pre-ulcerative calluses   3. Diabetic polyneuropathy associated with type 2 diabetes mellitus ([place])     -Patient was evaluated and treated. All patient's and/or POA's questions/concerns answered on today's visit. -Debrided HKT distal tip left hallux. Applied betadine solution and light dressing. Discussed lesion may be developing due to repeated trauma of patient's shuffling gait causing his toe to hit the distal edge of the shoe. Daughter does not think he could use a Darco shoe safely and I recommended open toed sandals for now. Discussed MRI study if he remains symptomatic after the sandal trial. -Toenails 1-5 b/l were debrided in length and girth with sterile nail nippers and dremel without iatrogenic bleeding.  -Patient/POA to call should there be question/concern in the interim.  -Return in about 1 week (around 04/06/2022) to see Dr. Posey Pronto for evaluation of left hallux.  Marzetta Board, DPM

## 2022-04-10 ENCOUNTER — Other Ambulatory Visit: Payer: Self-pay | Admitting: Adult Health

## 2022-04-12 ENCOUNTER — Encounter (HOSPITAL_COMMUNITY): Payer: Medicare Other

## 2022-04-13 ENCOUNTER — Encounter (HOSPITAL_COMMUNITY)
Admission: RE | Admit: 2022-04-13 | Discharge: 2022-04-13 | Disposition: A | Discharge status: Home / Self Care | Payer: Medicare Other | Source: Ambulatory Visit | Attending: Cardiology | Admitting: Cardiology

## 2022-04-13 ENCOUNTER — Ambulatory Visit (HOSPITAL_COMMUNITY)
Admission: RE | Admit: 2022-04-13 | Discharge: 2022-04-13 | Disposition: A | Discharge status: Home / Self Care | Payer: Medicare Other | Source: Ambulatory Visit | Attending: Cardiology | Admitting: Cardiology

## 2022-04-13 DIAGNOSIS — I5022 Chronic systolic (congestive) heart failure: Secondary | ICD-10-CM | POA: Insufficient documentation

## 2022-04-13 DIAGNOSIS — I428 Other cardiomyopathies: Secondary | ICD-10-CM | POA: Insufficient documentation

## 2022-04-13 MED ORDER — TECHNETIUM TC 99M PYROPHOSPHATE
20.7000 | Freq: Once | INTRAVENOUS | Status: AC | PRN
Start: 2022-04-13 — End: 2022-04-13
  Administered 2022-04-13: 20.7 via INTRAVENOUS

## 2022-04-15 DIAGNOSIS — E854 Organ-limited amyloidosis: Secondary | ICD-10-CM | POA: Diagnosis not present

## 2022-04-15 DIAGNOSIS — I10 Essential (primary) hypertension: Secondary | ICD-10-CM | POA: Diagnosis not present

## 2022-04-17 ENCOUNTER — Other Ambulatory Visit: Payer: Self-pay | Admitting: Internal Medicine

## 2022-04-17 DIAGNOSIS — E1140 Type 2 diabetes mellitus with diabetic neuropathy, unspecified: Secondary | ICD-10-CM

## 2022-04-20 ENCOUNTER — Other Ambulatory Visit (HOSPITAL_COMMUNITY): Payer: Self-pay | Admitting: Family Medicine

## 2022-04-20 ENCOUNTER — Ambulatory Visit (HOSPITAL_COMMUNITY)
Admission: RE | Admit: 2022-04-20 | Discharge: 2022-04-20 | Disposition: A | Discharge status: Home / Self Care | Payer: Medicare Other | Source: Ambulatory Visit | Attending: Family Medicine | Admitting: Family Medicine

## 2022-04-20 ENCOUNTER — Encounter (HOSPITAL_COMMUNITY): Payer: Self-pay

## 2022-04-20 ENCOUNTER — Encounter (HOSPITAL_COMMUNITY): Payer: Medicare Other

## 2022-04-20 VITALS — BP 108/56 | HR 70 | Ht 71.0 in | Wt 155.0 lb

## 2022-04-20 DIAGNOSIS — Z79899 Other long term (current) drug therapy: Secondary | ICD-10-CM | POA: Diagnosis not present

## 2022-04-20 DIAGNOSIS — R531 Weakness: Secondary | ICD-10-CM | POA: Insufficient documentation

## 2022-04-20 DIAGNOSIS — Z7983 Long term (current) use of bisphosphonates: Secondary | ICD-10-CM | POA: Insufficient documentation

## 2022-04-20 DIAGNOSIS — E785 Hyperlipidemia, unspecified: Secondary | ICD-10-CM | POA: Diagnosis not present

## 2022-04-20 DIAGNOSIS — E119 Type 2 diabetes mellitus without complications: Secondary | ICD-10-CM | POA: Insufficient documentation

## 2022-04-20 DIAGNOSIS — I110 Hypertensive heart disease with heart failure: Secondary | ICD-10-CM | POA: Insufficient documentation

## 2022-04-20 DIAGNOSIS — Z7902 Long term (current) use of antithrombotics/antiplatelets: Secondary | ICD-10-CM | POA: Insufficient documentation

## 2022-04-20 DIAGNOSIS — E859 Amyloidosis, unspecified: Secondary | ICD-10-CM | POA: Diagnosis not present

## 2022-04-20 DIAGNOSIS — E854 Organ-limited amyloidosis: Secondary | ICD-10-CM

## 2022-04-20 DIAGNOSIS — I480 Paroxysmal atrial fibrillation: Secondary | ICD-10-CM | POA: Diagnosis not present

## 2022-04-20 DIAGNOSIS — Z8673 Personal history of transient ischemic attack (TIA), and cerebral infarction without residual deficits: Secondary | ICD-10-CM | POA: Insufficient documentation

## 2022-04-20 DIAGNOSIS — I5022 Chronic systolic (congestive) heart failure: Secondary | ICD-10-CM | POA: Diagnosis not present

## 2022-04-20 DIAGNOSIS — Z7984 Long term (current) use of oral hypoglycemic drugs: Secondary | ICD-10-CM | POA: Diagnosis not present

## 2022-04-20 DIAGNOSIS — I43 Cardiomyopathy in diseases classified elsewhere: Secondary | ICD-10-CM

## 2022-04-20 DIAGNOSIS — Z7901 Long term (current) use of anticoagulants: Secondary | ICD-10-CM | POA: Insufficient documentation

## 2022-04-20 LAB — BASIC METABOLIC PANEL
Anion gap: 11 (ref 5–15)
BUN: 31 mg/dL — ABNORMAL HIGH (ref 8–23)
CO2: 24 mmol/L (ref 22–32)
Calcium: 9.3 mg/dL (ref 8.9–10.3)
Chloride: 104 mmol/L (ref 98–111)
Creatinine, Ser: 1.19 mg/dL (ref 0.61–1.24)
GFR, Estimated: >60 mL/min (ref >60)
Glucose, Bld: 79 mg/dL (ref 70–99)
Potassium: 4.2 mmol/L (ref 3.5–5.1)
Sodium: 139 mmol/L (ref 135–145)

## 2022-04-20 MED ORDER — TAFAMIDIS 61 MG PO CAPS: 61.0000 mg | ORAL_CAPSULE | Freq: Every day | ORAL | Status: DC

## 2022-04-20 NOTE — Patient Instructions (Addendum)
Thank you for coming in today  Labs were done today, if any labs are abnormal the clinic will call you No news is good news  Your physician recommends that you schedule a follow-up appointment in:  Keep follow up with Dr. Aundra Dubin  At the [facility], you and your health needs are our priority. As part of our continuing mission to provide you with exceptional heart care, we have created designated Provider Care Teams. These Care Teams include your primary Cardiologist (physician) and Advanced Practice Providers (APPs- Physician Assistants and Nurse Practitioners) who all work together to provide you with the care you need, when you need it.   You may see any of the following providers on your designated Care Team at your next follow up: Dr Glori Bickers Dr Haynes Kerns, NP Lyda Jester, [place] [facility] [place], [place] Audry Riles, PharmD   Please be sure to bring in all your medications bottles to every appointment.   If you have any questions or concerns before your next appointment please send [place] a message through [place] or call our office at [phone].    TO LEAVE A MESSAGE FOR THE NURSE SELECT OPTION 2, PLEASE LEAVE A MESSAGE INCLUDING: YOUR NAME DATE OF BIRTH CALL BACK NUMBER REASON FOR CALL**this is important as we prioritize the call backs  YOU WILL RECEIVE A CALL BACK THE SAME DAY AS LONG AS YOU CALL BEFORE 4:00 PM

## 2022-04-20 NOTE — Progress Notes (Signed)
PCP: [name], MD Cardiology: Dr. Caryl Comes HF Cardiology: Dr. Aundra Dubin  81 y.o. with history of type 2 DM, HTN, CVA, paroxysmal atrial fibrillation, and chronic systolic CHF was referred by Dr. Caryl Comes for evaluation of CHF.  Patient is on Eliquis now for PAF, he had a CVA in 2018.  Echo at that time showed EF 55-60%.  In the summer of 2022, he developed progressive exertional dyspnea. He was seen in the ER and was noted to have mildly elevate HS-TnI without trend, thought to be due to volume overload from CHF.  Echo was done in 9/22 showing EF 25-30%, mild LVH, moderate RV enlargement with normal RV systolic function, PASP 65 mmHg, moderate TR, moderate AI, moderate MR, IVC dilated.  Cardiolite in 9/22 showed EF 27%, no ischemia.  Patient was started on Lasix due to volume overload and it was titrated up.  Patient does not drink ETOH, smoke, or use drugs.  He has no family history of heart disease.    RHC/LHC in 11/22 showed mild nonobstructive CAD, CI 4.36, optimized filling pressures.  Cardiac MRI in 1/23 showed LV EF 45%, mild RV dilation with RV EF 48%, mid-wall patchy LGE in the basal to mid septum, ECV 52% in septum, mild-moderate MR, mild-moderate AI.   Follow up 2/23, stable NYHA II symptoms, BP low with some lightheadedness. Toprol decreased to 50 mg q hs with orthostatic symptoms.   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  Today he returns for HF follow up with his daughter to discuss PYP results. Last visit he was volume overloaded and instructed to increase lasix to 80/40 x 3 days, then return to 40 bid. Overall feeling fine. He continues to struggle with his balance and right-sided weakness since his CVA. He is able to get around and do ADL's without dyspnea. Denies palpitations, CP, dizziness, edema, or PND/Orthopnea. Appetite ok. No fever or chills. Weight at home 153 pounds. Doing better with taking all of his medications.  ECG (personally reviewed): NSR 75  bpm  Labs (10/22): LDL 44, TSH normal Labs (11/22): K 4, creatinine 1.17 Labs (12/22): K 4.3, creatinine 1.22 Labs (2/23): K 4.1, creatinine 1.19, hgb 12, myeloma panel with IgG monoclonal protein.  Labs (5/23): K 4.3, creatinine 1.18   PMH: 1. Type 2 diabetes 2. HTN 3. Hyperlipidemia 4. Atrial fibrillation: Paroxysmal 5. 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  6. GERD 7. Hypothyroidism 8. Chronic systolic CHF: Echo (5/00) with EF 55-60%.  - Echo (9/22): EF 25-30%, mild LVH, moderate RV enlargement with normal RV systolic function, PASP 65 mmHg, moderate TR, moderate AI, moderate MR, IVC dilated.  - Cardiolite (9/22): EF 27%, no ischemia.  - LHC/RHC (11/22): Mild nonobstructive CAD; mean RA 2, PA 41/7 mean 22, mean PCWP 11, CI 4.36 - Cardiac MRI (1/23): LV EF 45%, mild RV dilation with RV EF 48%, mid-wall patchy LGE in the basal to mid septum, ECV 52% in septum, mild-moderate MR, mild-moderate AI.  9. IgG kappa MGUS since 2004: Bone marrow biopsy 2004 with 2% plasma cells.  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[employer], occupational: RETIRED  Tobacco  Use   Smoking status: Never   Smokeless tobacco: Never  Vaping Use   Vaping Use: Never used  Substance and Sexual Activity   Alcohol use: No   Drug use: No   Sexual activity: Not on file  Other Topics Concern   Not on file  Social History Narrative   Not on file   Social Determinants of Health   Financial Resource Strain: Not on file  Food Insecurity: Not on file  Transportation Needs: Not on file  Physical Activity: Not on file  Stress: Not on file  Social Connections: Not on file  Intimate  Partner Violence: Not on file   Family History  Problem Relation Age of Onset   Sickle cell anemia Mother    Diabetes Father    Stomach cancer Father    Colon cancer Father    CAD Neg Hx    Esophageal cancer Neg Hx    Rectal cancer Neg Hx    ROS: All systems reviewed and negative except as per HPI.   Current Outpatient Medications  Medication Sig Dispense Refill   atorvastatin (LIPITOR) 40 MG tablet TAKE 1 TABLET BY MOUTH  DAILY FOR CHOLESTEROL 90 tablet 3   Blood Glucose Monitoring Suppl (ONE TOUCH ULTRA 2) w/Device KIT Check blood sugar 1 time a day-DX-E11.9 1 kit 0   Cholecalciferol (VITAMIN D PO) Take 1 tablet by mouth daily.      Cyanocobalamin (B-12) 50 MCG TABS Take 50 mcg by mouth daily.     dapagliflozin propanediol (FARXIGA) 10 MG TABS tablet Take 1 tablet (10 mg total) by mouth daily before breakfast. 90 tablet 3   ELIQUIS 5 MG TABS tablet TAKE 1 TABLET BY MOUTH  TWICE DAILY TO PREVENT  BLOOD CLOTS 180 tablet 3   ferrous sulfate 325 (65 FE) MG tablet Take 325 mg by mouth at bedtime.     finasteride (PROSCAR) 5 MG tablet TAKE 1 TABLET BY MOUTH  DAILY FOR PROSTATE 90 tablet 3   furosemide (LASIX) 40 MG tablet Take 1 tablet (40 mg total) by mouth 2 (two) times daily. 60 tablet 11   gabapentin (NEURONTIN) 300 MG capsule Take  1 to 2 capsules  1 hour  before Bedtime  For Diabetic Neuropathy Pains                        /               TAKE              BY                    MOUTH 180 capsule 3   glipiZIDE (GLUCOTROL) 5 MG tablet Take 1/2 tablet  2 x /day  with Meals  for Diabetes 90 tablet 3   Lancets (ONETOUCH DELICA PLUS [id number]) MISC CHECK BLOOD SUGAR ONCE A DAY. 100 each 3   Lancets Misc. (ONE TOUCH SURESOFT) MISC Check blood sugar 1 time a day-DX-E11.91 1 each 0   levothyroxine (SYNTHROID) 50 MCG tablet Take  1 tablet  Daily  on an empty stomach with only water for 30 minutes & no Antacid meds, Calcium or Magnesium for 4 hours & avoid Biotin                                           /  TAKE                      BY                          MOUTH 90 tablet 3   Magnesium 250 MG TABS Take 250 mg by mouth daily.      metFORMIN (GLUCOPHAGE-XR) 500 MG 24 hr tablet Take  2 tablets  2 x /day  with Meals  for Diabetes                                                           /                                  TAKE 2 TABLETS BY MOUTH 360 tablet 3   metoprolol succinate (TOPROL XL) 50 MG 24 hr tablet Take 1 tablet (50 mg total) by mouth at bedtime. 90 tablet 0   Multiple Vitamin (MULTIVITAMIN) capsule Take 1 capsule by mouth daily.     ONETOUCH ULTRA test strip CHECK BLOOD SUGAR ONCE A DAY. 100 strip 3   sacubitril-valsartan (ENTRESTO) 24-26 MG Take 1 tablet by mouth 2 (two) times daily. 180 tablet 3   spironolactone (ALDACTONE) 25 MG tablet Take 1 tablet (25 mg total) by mouth at bedtime. 90 tablet 3   triamcinolone ointment (KENALOG) 0.1 % Apply topically 2 (two) times daily. Apply to rash 2 to 3 x /day (Patient taking differently: Apply topically 2 (two) times daily. Apply to rash 2 to 3 x /day as needed) 80 g 1   trolamine salicylate (ASPERCREME) 10 % cream Apply 1 application topically daily as needed for muscle pain.     vitamin C (ASCORBIC ACID) 500 MG tablet Take 500 mg by mouth daily.     No current facility-administered medications for this encounter.   Wt Readings from Last 3 Encounters:  04/20/22 70.3 kg (155 lb)  03/29/22 72.5 kg (159 lb 12.8 oz)  01/18/22 70.3 kg (155 lb)   BP (!) 108/56   Pulse 70   Ht $R'5\' 11"'Cy$  (1.803 m)   Wt 70.3 kg (155 lb)   SpO2 98%   BMI 21.62 kg/m  General:  NAD. No resp difficulty, arrived in [facility], elderly HEENT: Normal Neck: Supple. No JVD. Carotids 2+ bilat; no bruits. No lymphadenopathy or thryomegaly appreciated. Cor: PMI nondisplaced. Regular rate & rhythm. No rubs, gallops or murmurs. Lungs: Clear Abdomen: Soft, nontender, nondistended. No hepatosplenomegaly. No bruits or masses. Good bowel  sounds. Extremities: No cyanosis, clubbing, rash, trace pedal edema Neuro: Alert & oriented x 3, cranial nerves grossly intact. Moves all 4 extremities w/o difficulty, RLL weak Affect pleasant.  Assessment/Plan: 1. Chronic systolic CHF:  Echo in 8/25 with EF 25-30%, mild LVH, moderate RV enlargement with normal RV systolic function, PASP 65 mmHg, moderate TR, moderate AI, moderate MR, IVC dilated.  LHC/RHC in 11/22 showed mild nonobstructive CAD with optimized filling pressures and preserved cardiac output.  Cause of cardiomyopathy is uncertain.  No history of drugs/ETOH.  No family history of cardiomyopathy.  He does have some risk for cardiac amyloidosis with peripheral neuropathy and autonomic neuropathy (orthostatic hypotension) symptoms (alternatively could  be due to diabetes).  Cardiac MRI in 1/23 showed LV EF 45%, mild RV dilation with RV EF 48%, mid-wall patchy LGE in the basal to mid septum, ECV 52% in septum, mild-moderate MR, mild-moderate AI.  Possible prior myocarditis as cause of CMP, but would also consider cardiac amyloidosis with ECV 52% though LGE is not classic for amyloidosis and LV walls not thickened. He is not volume overloaded today. NYHA class II symptoms, functional class difficult due to weakness from CVA. - Continue Lasix 40 mg bid. BMET today. - Continue Entresto 24/26 bid, no BP room to increase.    - Continue Farxiga 10 mg daily.  - Continue Toprol XL 50 mg qhs (recently decreased with orthostatic symptoms).   - Continue spironolactone 25 mg qhs.   - Narrow QRS, not candidate for CRT.  EF on cMRI out of range for ICD.   - cMRI has ECV 52% which is concerning for cardiac amyloidosis, though LGE pattern not classic for amyloidosis and LV walls not thickened.  Patient has history of MGUS with a very small M-spike, no change over a number of years, suspect not AL amyloidosis.    2. 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 Discussed  diagnosis with patient and his daughter. - Discussed with Dr. Aundra Dubin, start tafamadis. Pharmacy helping with PA. - Will arrange for genetic testing.  3. Atrial fibrillation: Paroxysmal.  He is in NSR today. History of CVA.  - Continue Eliquis. No bleeding issues. 4. Hyperlipidemia: Good lipids in 10/22.  - Continue atorvastatin.   Follow up in 2 months with Dr. Aundra Dubin as scheduled.  Maricela Bo [facility] FNP-BC 04/20/2022

## 2022-04-21 ENCOUNTER — Telehealth (HOSPITAL_COMMUNITY): Payer: Self-pay | Admitting: Pharmacy Technician

## 2022-04-21 ENCOUNTER — Encounter (HOSPITAL_COMMUNITY): Payer: Self-pay | Admitting: Cardiology

## 2022-04-21 ENCOUNTER — Encounter (HOSPITAL_COMMUNITY): Payer: Medicare Other

## 2022-04-21 NOTE — Telephone Encounter (Signed)
Advanced Heart Failure Patient Advocate Encounter  The patient was approved for a [place] that will help cover the cost of Vyndamax. Total amount awarded, $10,000. Eligibility, 03/22/22 - 03/22/23.  ID [id number]  BIN [id number]  PCN [id number]  Group [id number]

## 2022-04-27 ENCOUNTER — Ambulatory Visit: Payer: Medicare Other | Admitting: Podiatry

## 2022-04-27 DIAGNOSIS — L89891 Pressure ulcer of other site, stage 1: Secondary | ICD-10-CM | POA: Diagnosis not present

## 2022-04-28 NOTE — Progress Notes (Signed)
Subjective:  Patient ID: Earl [name], male    DOB: 12/31/1940,  MRN: [id number]  Chief Complaint  Patient presents with   Callouses    81 y.o. male presents with the above complaint.  Patient presents with left distal hallux pressure injury.  Patient known is known to Dr. Adah Perl.  She wanted me to take a look at it make sure that there is no underlying sore.  He states that the pain has gotten better.  He states is improving.  He does not recall how this started.  He is a diabetic he wanted to get it evaluated denies any other acute complaints.  1 out of 10 pain scale   Review of Systems: Negative except as noted in the HPI. Denies N/V/F/Ch.  Past Medical History:  Diagnosis Date   Adenomatous colon polyp    Allergy    Anal fissure    Arthritis    BPH (benign prostatic hyperplasia)    Diabetes mellitus (H[facility])    GERD (gastroesophageal reflux disease)    Hyperlipidemia    Hypertension    Hypothyroidism    Other testicular hypofunction    Stroke ([place])     Current Outpatient Medications:    atorvastatin (LIPITOR) 40 MG tablet, TAKE 1 TABLET BY MOUTH  DAILY FOR CHOLESTEROL, Disp: 90 tablet, Rfl: 3   Blood Glucose Monitoring Suppl (ONE TOUCH ULTRA 2) w/Device KIT, Check blood sugar 1 time a day-DX-E11.9, Disp: 1 kit, Rfl: 0   Cholecalciferol (VITAMIN D PO), Take 1 tablet by mouth daily. , Disp: , Rfl:    Cyanocobalamin (B-12) 50 MCG TABS, Take 50 mcg by mouth daily., Disp: , Rfl:    dapagliflozin propanediol (FARXIGA) 10 MG TABS tablet, Take 1 tablet (10 mg total) by mouth daily before breakfast., Disp: 90 tablet, Rfl: 3   ELIQUIS 5 MG TABS tablet, TAKE 1 TABLET BY MOUTH  TWICE DAILY TO PREVENT  BLOOD CLOTS, Disp: 180 tablet, Rfl: 3   ferrous sulfate 325 (65 FE) MG tablet, Take 325 mg by mouth at bedtime., Disp: , Rfl:    finasteride (PROSCAR) 5 MG tablet, TAKE 1 TABLET BY MOUTH  DAILY FOR PROSTATE, Disp: 90 tablet, Rfl: 3   furosemide (LASIX) 40 MG tablet, Take 1 tablet (40  mg total) by mouth 2 (two) times daily., Disp: 60 tablet, Rfl: 11   gabapentin (NEURONTIN) 300 MG capsule, Take  1 to 2 capsules  1 hour  before Bedtime  For Diabetic Neuropathy Pains                        /               TAKE              BY                    MOUTH, Disp: 180 capsule, Rfl: 3   glipiZIDE (GLUCOTROL) 5 MG tablet, Take 1/2 tablet  2 x /day  with Meals  for Diabetes, Disp: 90 tablet, Rfl: 3   Lancets (ONETOUCH DELICA PLUS [id number]) MISC, CHECK BLOOD SUGAR ONCE A DAY., Disp: 100 each, Rfl: 3   Lancets Misc. (ONE TOUCH SURESOFT) MISC, Check blood sugar 1 time a day-DX-E11.91, Disp: 1 each, Rfl: 0   levothyroxine (SYNTHROID) 50 MCG tablet, Take  1 tablet  Daily  on an empty stomach with only water for 30 minutes & no Antacid meds, Calcium or Magnesium for 4  hours & avoid Biotin                                          /                              TAKE                      BY                          MOUTH, Disp: 90 tablet, Rfl: 3   Magnesium 250 MG TABS, Take 250 mg by mouth daily. , Disp: , Rfl:    metFORMIN (GLUCOPHAGE-XR) 500 MG 24 hr tablet, Take  2 tablets  2 x /day  with Meals  for Diabetes                                                           /                                  TAKE 2 TABLETS BY MOUTH, Disp: 360 tablet, Rfl: 3   metoprolol succinate (TOPROL XL) 50 MG 24 hr tablet, Take 1 tablet (50 mg total) by mouth at bedtime., Disp: 90 tablet, Rfl: 0   Multiple Vitamin (MULTIVITAMIN) capsule, Take 1 capsule by mouth daily., Disp: , Rfl:    ONETOUCH ULTRA test strip, CHECK BLOOD SUGAR ONCE A DAY., Disp: 100 strip, Rfl: 3   sacubitril-valsartan (ENTRESTO) 24-26 MG, Take 1 tablet by mouth 2 (two) times daily., Disp: 180 tablet, Rfl: 3   spironolactone (ALDACTONE) 25 MG tablet, Take 1 tablet (25 mg total) by mouth at bedtime., Disp: 90 tablet, Rfl: 3   Tafamidis 61 MG CAPS, Take 61 mg by mouth daily., Disp: 30 capsule, Rfl:    triamcinolone ointment (KENALOG) 0.1 %, Apply  topically 2 (two) times daily. Apply to rash 2 to 3 x /day (Patient taking differently: Apply topically 2 (two) times daily. Apply to rash 2 to 3 x /day as needed), Disp: 80 g, Rfl: 1   trolamine salicylate (ASPERCREME) 10 % cream, Apply 1 application topically daily as needed for muscle pain., Disp: , Rfl:    vitamin C (ASCORBIC ACID) 500 MG tablet, Take 500 mg by mouth daily., Disp: , Rfl:   Social History   Tobacco Use  Smoking Status Never  Smokeless Tobacco Never    Allergies  Allergen Reactions   Penicillins Other (See Comments)    Did it involve swelling of the face/tongue/throat, SOB, or low BP? Unknown Did it involve sudden or severe rash/hives, skin peeling, or any reaction on the inside of your mouth or nose? Unknown Did you need to seek medical attention at a hospital or doctor's office? No When did it last happen?    Over 66 Years Ago   If all above answers are "NO", may proceed with cephalosporin use.     Objective:  There were no vitals filed for this visit. There is no height or weight on  file to calculate BMI. Constitutional Well developed. Well nourished.  Vascular Dorsalis pedis pulses faintly palpable bilaterally. Posterior tibial pulses faintly palpable bilaterally. Capillary refill normal to all digits.  No cyanosis or clubbing noted. Pedal hair growth normal.  Neurologic Normal speech. Oriented to person, place, and time. Epicritic sensation to light touch grossly present bilaterally.  Dermatologic Nails left hallux distal pressure also noted without any breakdown of the skin.  This is more of a preulcerative callus.  No signs of infection noted does not probe down to deep bone. Skin within normal limits  Orthopedic: Normal joint ROM without pain or crepitus bilaterally. No visible deformities. No bony tenderness.   Radiographs: None Assessment:   1. Pressure injury of toe of left foot, stage 1    Plan:  Patient was evaluated and treated and all  questions answered.  Left hallux pressure injury -All questions and concerns were discussed with the patient extensive detail -At this time out of concern for gangrene or any breakdown of the skin therefore I discussed shoe gear modification with padding protecting as well as toe protectors.  He states understanding and will work on that. -If it continues to regress I will see him back again and do further work-up.  No follow-ups on file.

## 2022-05-06 ENCOUNTER — Other Ambulatory Visit (HOSPITAL_COMMUNITY): Payer: Self-pay | Admitting: Cardiology

## 2022-05-06 ENCOUNTER — Telehealth: Payer: Self-pay

## 2022-05-06 NOTE — Telephone Encounter (Signed)
LM-05/06/22-Calling pt. To confirm CP initial visit for 6/20 at 10:30AM. Unable to reach pt. [place].  Total time spent: 2 min.

## 2022-05-09 NOTE — Telephone Encounter (Signed)
Addendum: 05/06/22-Chart Prep started, Reviewing OV, Consults, Hospital visits, Labs and medication changes.  Chart prep completed.  Total time spent: 59 min.

## 2022-05-09 NOTE — Telephone Encounter (Signed)
LM-05/09/22-Called pt. To r/s CP initial visit d/t CP schedule overbooked. Unable to reach pt. LVMTRC to r/s visit.  Total time spent: 2 min.

## 2022-05-10 ENCOUNTER — Ambulatory Visit: Payer: Medicare Other | Admitting: Pharmacy Technician

## 2022-05-10 ENCOUNTER — Telehealth (HOSPITAL_COMMUNITY): Payer: Self-pay | Admitting: Pharmacist

## 2022-05-10 ENCOUNTER — Other Ambulatory Visit (HOSPITAL_COMMUNITY): Payer: Self-pay

## 2022-05-10 NOTE — Telephone Encounter (Signed)
Advanced Heart Failure Patient Advocate Encounter  The patient was approved for a [place] grant that will help cover the cost of Vyndamax. Total amount awarded, $8500. Eligibility dates, 02/09/22 - 05/10/23.   BIN [id number] PCN PANF ID [id number] Group [id number]  Called patient's daughter and left vm to start specialty pharmacy coordination to ship Vyndamax.

## 2022-05-10 NOTE — Telephone Encounter (Signed)
Advanced Heart Failure Patient Advocate Encounter  Prior Authorization for [employer] has been approved.    PA# [id number] Effective dates: 05/10/22 through 11/20/22  Patients co-pay is $2711.09   Audry Riles, PharmD, BCPS, BCCP, CPP Heart Failure Clinic Pharmacist [phone]

## 2022-05-10 NOTE — Telephone Encounter (Signed)
Patient Advocate Encounter   Received notification from [employer] that prior authorization for Vyndamax is required.   PA submitted on CoverMyMeds Key [id number] Status is pending   Will continue to follow.   Audry Riles, PharmD, BCPS, BCCP, CPP Heart Failure Clinic Pharmacist [phone]

## 2022-05-11 ENCOUNTER — Other Ambulatory Visit (HOSPITAL_COMMUNITY): Payer: Self-pay | Admitting: Pharmacist

## 2022-05-11 ENCOUNTER — Other Ambulatory Visit (HOSPITAL_COMMUNITY): Payer: Self-pay

## 2022-05-11 MED ORDER — VYNDAMAX 61 MG PO CAPS
1.0000 | ORAL_CAPSULE | Freq: Every day | ORAL | 11 refills | Status: DC
  Filled 2022-05-11: qty 30, 30d supply, fill #0
  Filled 2022-06-06: qty 30, 30d supply, fill #1
  Filled 2022-06-30: qty 30, 30d supply, fill #2
  Filled 2022-07-26: qty 30, 30d supply, fill #3
  Filled 2022-08-24: qty 30, 30d supply, fill #4
  Filled 2022-09-21: qty 30, 30d supply, fill #5
  Filled 2022-10-11: qty 30, 30d supply, fill #6
  Filled 2022-11-17: qty 30, 30d supply, fill #7
  Filled 2022-12-19: qty 30, 30d supply, fill #8
  Filled 2023-01-23: qty 30, 30d supply, fill #9
  Filled 2023-02-17: qty 30, 30d supply, fill #10
  Filled 2023-03-13: qty 30, 30d supply, fill #11

## 2022-05-11 NOTE — Telephone Encounter (Signed)
LM-05/11/22-Calling pt. To r/s CP initial visit.Unable to reach pt. Will try calling at another time.  Total time spent: 2 min.

## 2022-05-12 NOTE — Telephone Encounter (Signed)
LM-05/12/22-Pts. Daughter Para returned call to schedule pt. For initial visit w/ CP requesting visits on Monday or Wednesday.Calling patients daughter Para back to schedule CP visit and inform her CP is only in office on Tuesdays. Unable to reach pt. Daughter. Left detailed message for daughter to return call.  Total time spent: 2 min.

## 2022-05-16 ENCOUNTER — Other Ambulatory Visit (HOSPITAL_COMMUNITY): Payer: Self-pay

## 2022-05-17 ENCOUNTER — Ambulatory Visit: Payer: Medicare Other | Admitting: Adult Health

## 2022-05-18 ENCOUNTER — Ambulatory Visit (INDEPENDENT_AMBULATORY_CARE_PROVIDER_SITE_OTHER): Payer: Medicare Other | Admitting: Nurse Practitioner

## 2022-05-18 ENCOUNTER — Encounter: Payer: Self-pay | Admitting: Nurse Practitioner

## 2022-05-18 ENCOUNTER — Other Ambulatory Visit (HOSPITAL_COMMUNITY): Payer: Self-pay | Admitting: Cardiology

## 2022-05-18 VITALS — BP 113/59 | HR 63 | Temp 96.9°F | Resp 16 | Wt 158.4 lb

## 2022-05-18 DIAGNOSIS — D472 Monoclonal gammopathy: Secondary | ICD-10-CM

## 2022-05-18 DIAGNOSIS — I5043 Acute on chronic combined systolic (congestive) and diastolic (congestive) heart failure: Secondary | ICD-10-CM

## 2022-05-18 DIAGNOSIS — I700 Atherosclerosis of aorta: Secondary | ICD-10-CM

## 2022-05-18 DIAGNOSIS — I480 Paroxysmal atrial fibrillation: Secondary | ICD-10-CM

## 2022-05-18 DIAGNOSIS — I10 Essential (primary) hypertension: Secondary | ICD-10-CM

## 2022-05-18 DIAGNOSIS — E1122 Type 2 diabetes mellitus with diabetic chronic kidney disease: Secondary | ICD-10-CM

## 2022-05-18 DIAGNOSIS — R269 Unspecified abnormalities of gait and mobility: Secondary | ICD-10-CM

## 2022-05-18 DIAGNOSIS — E1169 Type 2 diabetes mellitus with other specified complication: Secondary | ICD-10-CM

## 2022-05-18 DIAGNOSIS — I6930 Unspecified sequelae of cerebral infarction: Secondary | ICD-10-CM

## 2022-05-18 DIAGNOSIS — B351 Tinea unguium: Secondary | ICD-10-CM

## 2022-05-18 DIAGNOSIS — N401 Enlarged prostate with lower urinary tract symptoms: Secondary | ICD-10-CM

## 2022-05-18 DIAGNOSIS — Z79899 Other long term (current) drug therapy: Secondary | ICD-10-CM

## 2022-05-18 DIAGNOSIS — E559 Vitamin D deficiency, unspecified: Secondary | ICD-10-CM

## 2022-05-18 DIAGNOSIS — Z0000 Encounter for general adult medical examination without abnormal findings: Secondary | ICD-10-CM

## 2022-05-18 DIAGNOSIS — I69393 Ataxia following cerebral infarction: Secondary | ICD-10-CM

## 2022-05-18 NOTE — Progress Notes (Signed)
MEDICARE ANNUAL WELLNESS VISIT AND FOLLOW UP Assessment:   1. Encounter for Medicare annual wellness exam Due Annually   2. Paroxysmal atrial fibrillation (HCC) Continue medications, rate controlled, follow up cardio  3. Essential hypertension Controlled  Continue medications;  Discussed DASH (Dietary Approaches to Stop Hypertension) DASH diet is lower in sodium than a typical American diet. Cut back on foods that are high in saturated fat, cholesterol, and trans fats. Eat more whole-grain foods, fish, poultry, and nuts Remain active and exercise as tolerated daily.  Monitor BP at home-Call if greater than 130/80.    4. Abdominal aortic atherosclerosis ([place]) by Abd CTscvn  2016 Control blood pressure, cholesterol, glucose, increase exercise.   5. Acute on chronic combined systolic and diastolic congestive heart failure (HCC) Continue medications;  Discussed DASH (Dietary Approaches to Stop Hypertension) DASH diet is lower in sodium than a typical American diet. Cut back on foods that are high in saturated fat, cholesterol, and trans fats. Eat more whole-grain foods, fish, poultry, and nuts Remain active and exercise as tolerated daily.  Monitor BP at home-Call if greater than 130/80.   6. Hyperlipidemia associated with type 2 diabetes mellitus (HCC) Controlled Continue medications;  Discussed lifestyle modifications. Recommended diet heavy in fruits and veggies, omega 3's. Decrease consumption of animal meats, cheeses, and dairy products. Remain active and exercise as tolerated. Continue to monitor.   7. CKD stage 2 due to type 2 diabetes mellitus ([place]) Discussed how what you eat and drink can aide in kidney protection. Stay well hydrated. Avoid high salt foods. Avoid NSAIDS. Keep BP and BG well controlled.   Take medications as prescribed. Remain active and exercise as tolerated daily. Maintain weight.  Continue to monitor.   8. Pain due to onychomycosis of  toenail of left foot Following with wound management. Well controlled. Continue to monitor   9. Benign prostatic hyperplasia with urinary frequency Well managed. Continue to monitor   10. Vitamin D deficiency Controlled.  Continue to monitor   11. Late effect of cerebrovascular accident (CVA) Care is now coming tues thrus fri - home instead is what they are using. Son has set up in home care that patient feels is private and is requesting for  insurance to assess [facility] RN evaluation.  12. MGUS (monoclonal gammopathy of unknown significance) Hem/onc follows Continue to monitor   13. Medication management All medications discussed and reviewed in full. All questions and concerns regarding medications addressed.     14. Neurologic gait disorder Continue to walk with a cane, no falls  15. Ataxia, post-stroke Continue to walk with a cane, no falls    Over 30 minutes of exam, counseling, chart review, and critical decision making was performed  Future Appointments  Date Time Provider [place]  07/06/2022 10:40 AM Larey Dresser, MD [facility] None  07/13/2022  2:15 PM Marzetta Board, DPM [facility]  09/12/2022 11:00 AM [name], MD [facility] None  12/29/2022 11:00 AM [facility] None  01/05/2023 10:30 AM Wyatt Portela, MD [facility] None  05/22/2023  2:00 PM Alycia Rossetti, NP [facility] None     Plan:   During the course of the visit the patient was educated and counseled about appropriate screening and preventive services including:   Pneumococcal vaccine  Influenza vaccine Prevnar 13 Td vaccine Screening electrocardiogram Colorectal cancer screening Diabetes screening Glaucoma screening Nutrition counseling    Subjective:  Earl [name] is a 81 y.o. male who presents with daughter caretaker for  Medicare Annual Wellness Visit and 3 month follow up for HTN, hyperlipidemia DMII, CKD, atrial fibrillation,  BPH, and vitamin D Def.    Wife [name] passed earlier this year, had been married 59 years. His daughter [name] stays with him twice a week, Son also checks in on him.  Daughter is also involved in care.  Each child takes turns caring for him.  He is doing better and stopped short term xanax.  He ambulates with cane.   Son has rearranged an in home private care taker to which patient would like to re-assess through insurance for payment.  He reports thickened deviated toe nails and neuropathic pains in feet; bil but L is bothering him more. He was referred to Podiatry and is currently having wound care to the toe.  Reports healing and feeling much better.  Able to walk more steadily.  BMI is Body mass index is 22.09 kg/m., he is working on diet and exercise[facility] 1.5 hours 3 days a week.  Wt Readings from Last 3 Encounters:  05/18/22 158 lb 6.4 oz (71.8 kg)  04/20/22 155 lb (70.3 kg)  03/29/22 159 lb 12.8 oz (72.5 kg)   He also follows with Dr Alen Blew for MGUS.  He also has history of CVA in 2018, Rt cerebellar thrombotic and placed on Eliquis for Afib, follows with Dr. Caryl Comes.  Last ECHO 2018 with moderate LVH, EF 55-60%. He uses a cane for gait stability for right ataxia sp CVA. He did follow with speeth therapy with sigificant improvement, reports dysphagia has improved/resolved.    His blood pressure has been controlled at home, today their BP is BP: (!) 113/59 He does workout. He denies chest pain, shortness of breath, dizziness.   Per recent cardiology note losartan 25 mg was suggested for diabetic renal protection. He was previously on lisinopril low dose 5 mg, however this was stopped due to hypotension/dizziness at home, reports improved sx. Reviewed 10 year renal labs, appear has been fairly stable without significant trend down. His diabetes has been very well controlled.   He has abdominal aortic atherosclerosis per CT 2016.   He is on cholesterol medication  and denies myalgias. His cholesterol is at goal. The cholesterol last visit was:   Lab Results  Component Value Date   CHOL 105 09/10/2021   HDL 49 09/10/2021   LDLCALC 44 09/10/2021   TRIG 41 09/10/2021   CHOLHDL 2.1 09/10/2021   He has not been working on diet and exercise for T2 diabetes, on metformin and glipizide 5 mg BID, on eliquis, statin and denies hyperglycemia, hypoglycemia , nausea, polydipsia, and polyuria. He does report burning in bil feet at night, does take gabapentin 600 mg at HS for this. Last A1C in the office was:  Lab Results  Component Value Date   HGBA1C 6.0 (H) 09/10/2021    He has CKD II associated with T2DM monitored at this office. Not on ACEi due to hypotension. Last GFR:  Lab Results  Component Value Date   GFRAA 77 05/17/2021   Patient is on Vitamin D supplement.   Lab Results  Component Value Date   VD25OH 46 09/10/2021     He is on thyroid medication. His medication was not changed last visit. 50 mcg, takes 1 tab Sun/Tues/Thus, 1/2 tab all others.  Lab Results  Component Value Date   TSH 0.69 09/10/2021  .     Medication Review:  Current Outpatient Medications (Endocrine & Metabolic):  dapagliflozin propanediol (FARXIGA) 10 MG TABS tablet, Take 1 tablet (10 mg total) by mouth daily before breakfast.   glipiZIDE (GLUCOTROL) 5 MG tablet, Take 1/2 tablet  2 x /day  with Meals  for Diabetes   levothyroxine (SYNTHROID) 50 MCG tablet, Take  1 tablet  Daily  on an empty stomach with only water for 30 minutes & no Antacid meds, Calcium or Magnesium for 4 hours & avoid Biotin                                          /                              TAKE                      BY                          MOUTH   metFORMIN (GLUCOPHAGE-XR) 500 MG 24 hr tablet, Take  2 tablets  2 x /day  with Meals  for Diabetes                                                           /                                  TAKE 2 TABLETS BY MOUTH  Current Outpatient Medications  (Cardiovascular):    atorvastatin (LIPITOR) 40 MG tablet, TAKE 1 TABLET BY MOUTH  DAILY FOR CHOLESTEROL   furosemide (LASIX) 40 MG tablet, Take 1 tablet (40 mg total) by mouth 2 (two) times daily.   metoprolol succinate (TOPROL-XL) 50 MG 24 hr tablet, TAKE 1 TABLET BY MOUTH AT  BEDTIME   sacubitril-valsartan (ENTRESTO) 24-26 MG, Take 1 tablet by mouth 2 (two) times daily.   spironolactone (ALDACTONE) 25 MG tablet, Take 1 tablet (25 mg total) by mouth at bedtime.   Tafamidis (VYNDAMAX) 61 MG CAPS, Take 1 capsule by mouth daily.    Current Outpatient Medications (Hematological):    Cyanocobalamin (B-12) 50 MCG TABS, Take 50 mcg by mouth daily.   ELIQUIS 5 MG TABS tablet, TAKE 1 TABLET BY MOUTH  TWICE DAILY TO PREVENT  BLOOD CLOTS   ferrous sulfate 325 (65 FE) MG tablet, Take 325 mg by mouth at bedtime.  Current Outpatient Medications (Other):    Blood Glucose Monitoring Suppl (ONE TOUCH ULTRA 2) w/Device KIT, Check blood sugar 1 time a day-DX-E11.9   Cholecalciferol (VITAMIN D PO), Take 1 tablet by mouth daily.    finasteride (PROSCAR) 5 MG tablet, TAKE 1 TABLET BY MOUTH  DAILY FOR PROSTATE   gabapentin (NEURONTIN) 300 MG capsule, Take  1 to 2 capsules  1 hour  before Bedtime  For Diabetic Neuropathy Pains                        /               TAKE  BY                    MOUTH   Lancets (ONETOUCH DELICA PLUS FXOVAN19T) MISC, CHECK BLOOD SUGAR ONCE A DAY.   Lancets Misc. (ONE TOUCH SURESOFT) MISC, Check blood sugar 1 time a day-DX-E11.91   Magnesium 250 MG TABS, Take 250 mg by mouth daily.    Multiple Vitamin (MULTIVITAMIN) capsule, Take 1 capsule by mouth daily.   ONETOUCH ULTRA test strip, CHECK BLOOD SUGAR ONCE A DAY.   triamcinolone ointment (KENALOG) 0.1 %, Apply topically 2 (two) times daily. Apply to rash 2 to 3 x /day (Patient taking differently: Apply topically 2 (two) times daily. Apply to rash 2 to 3 x /day as needed)   trolamine salicylate (ASPERCREME) 10 % cream,  Apply 1 application topically daily as needed for muscle pain.   vitamin C (ASCORBIC ACID) 500 MG tablet, Take 500 mg by mouth daily.  Allergies: Allergies  Allergen Reactions   Penicillins Other (See Comments)    Did it involve swelling of the face/tongue/throat, SOB, or low BP? Unknown Did it involve sudden or severe rash/hives, skin peeling, or any reaction on the inside of your mouth or nose? Unknown Did you need to seek medical attention at a hospital or doctor's office? No When did it last happen?    Over 55 Years Ago   If all above answers are "NO", may proceed with cephalosporin use.      Current Problems (verified) has Paroxysmal atrial fibrillation ([place]); MGUS (monoclonal gammopathy of unknown significance); Essential hypertension; Hyperlipidemia associated with type 2 diabetes mellitus ([place]); Type 2 diabetes mellitus (HCC); BPH (benign prostatic hyperplasia); Medication management; Vitamin D deficiency; Hypothyroidism; History of CVA with residual deficit; Ataxia, post-stroke; Chronic anticoagulation; CKD stage 2 due to type 2 diabetes mellitus ([place]); Late effect of cerebrovascular accident (CVA); Acquired thrombophilia ([place]); Abdominal aortic atherosclerosis ([place]) by Abd CTscvn  2016; Neurologic gait disorder; Dizziness; History of loop recorder; Diabetic 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 ([place]); Depressed mood; Pain due to onychomycosis of toenail of left foot; Iron deficiency anemia; Acute on chronic congestive heart failure ([place]); and NICM (nonischemic cardiomyopathy) ([place]) on their problem list.  Screening Tests Immunization History  Administered Date(s) Administered   DT (Pediatric) 02/09/2015   Influenza Split 08/07/2013   Influenza, High Dose Seasonal PF 08/19/2014, 08/28/2015, 09/13/2016, 08/15/2017, 08/15/2018, 09/06/2019, 10/19/2020   PFIZER(Purple Top)SARS-COV-2 Vaccination 12/11/2019, 01/01/2020, 02/24/2021   Pneumococcal Conjugate-13 06/10/2016    Pneumococcal Polysaccharide-23 08/13/2008   Td 11/22/2003   Zoster Recombinat (Shingrix) 05/27/2021    Preventative care: Last colonoscopy: 04/2019, DONE EGD 04/2019  Echo/TEE 2018 EF 55%, MR MRI brain 2018 + stroke CXR 12/2019 CT head 2020 AB [place] 12/2019 fatty liver  Prior vaccinations: TD or Tdap: 2016  Influenza: 2021 Pneumococcal: 2009 Prevnar13: 2017 Shingrix: reports had first dose, pending completion  Covid 19: 2/2 + 2 boosters - missing 3rd booster date, record requesting  Names of Other Physician/Practitioners you currently use: 1. [facility] here for primary care 2. Diabetic Eye Exam: Dr. Melissa Noon, no retinopathy 10/08/2021 3. Dentist:  [employer], last 2023, goes q72m has scheduled in July, has upper partial   Patient Care Team: MUnk Pinto MD as PCP - General (Internal Medicine) KDeboraha Sprang MD as PCP - Cardiology (Cardiology) KDeboraha Sprang MD as PCP - Electrophysiology (Cardiology) MLarey Dresser MD as PCP - Advanced Heart Failure (Cardiology) KInda Castle MD (Inactive) as  Consulting Physician (Gastroenterology) Wyatt Portela, MD as Consulting Physician (Oncology) Sharyne Peach, MD as Consulting Physician (Ophthalmology) Newton Pigg, [facility] as Pharmacist (Pharmacist)  Surgical: He  has a past surgical history that includes Inguinal hernia repair (Bilateral); TEE without cardioversion (N/A, 07/28/2017); LOOP RECORDER INSERTION (N/A, 07/28/2017); Colonoscopy; Circumcision (N/A, 06/11/2019); and RIGHT/LEFT HEART CATH AND CORONARY ANGIOGRAPHY (N/A, 10/07/2021). Family His family history includes Colon cancer in his father; Diabetes in his father; Sickle cell anemia in his mother; Stomach cancer in his father. Social history  He reports that he has never smoked. He has never used smokeless tobacco. He reports that he does not drink alcohol and does not use drugs.  MEDICARE WELLNESS  OBJECTIVES: Physical activity: Exercise limited by: neurologic condition(s);orthopedic condition(s) Cardiac risk factors: Cardiac Risk Factors include: advanced age (>53mn, >>71women);male gender Depression/mood screen:      05/20/2022    1:16 PM  Depression screen PHQ 2/9  Decreased Interest 0  Down, Depressed, Hopeless 0  PHQ - 2 Score 0    ADLs:     05/20/2022    1:15 PM 05/18/2022    2:49 PM  In your present state of health, do you have any difficulty performing the following activities:  Hearing? 0 0  Vision? 0 0  Difficulty concentrating or making decisions? 0 0  Walking or climbing stairs? 1 0  Dressing or bathing? 1 0  Doing errands, shopping? 1 1  Preparing Food and eating ? N N  Using the Toilet? N N  In the past six months, have you accidently leaked urine? N N  Do you have problems with loss of bowel control? N N  Managing your Medications? Y N  Managing your Finances? Y N  Housekeeping or managing your Housekeeping? Y N      Cognitive Testing  Alert? Yes  Normal Appearance?Yes  Oriented to person? Yes  Place? Yes   Time? Yes  Recall of three objects?  1/3  Can perform simple calculations? Yes  Displays appropriate judgment?Yes  Can read the correct time from a watch face?Yes  EOL planning: Does Patient Have a Medical Advance Directive?: No Would patient like information on creating a medical advance directive?: No - Patient declined  Objective:   Today's Vitals   05/18/22 1427  BP: (!) 113/59  Pulse: 63  Resp: 16  Temp: (!) 96.9 F (36.1 C)  SpO2: 99%  Weight: 158 lb 6.4 oz (71.8 kg)    Body mass index is 22.09 kg/m.  General Appearance: Well nourished, well dressed AA elder male, in no apparent distress. Eyes: PERRLA, EOMs, conjunctiva no swelling or erythema Sinuses: No Frontal/maxillary tenderness ENT/Mouth: Ext aud canals clear, TMs without erythema, bulging. No erythema, swelling, or exudate on post pharynx.  Tonsils not swollen or  erythematous. Upper partials. Hearing normal.  Neck: Supple, thyroid normal.  Respiratory: Respiratory effort normal, BS equal bilaterally without rales, rhonchi, wheezing or stridor.  Cardio: RRR with no MRGs. Brisk peripheral pulses without edema.  Abdomen: Soft, + BS.  Non tender, no guarding, rebound, hernias, masses. Lymphatics: Non tender without lymphadenopathy.  Musculoskeletal: Full ROM, 5/5 strength, slow gait with cane Skin: Warm, dry without rashes, lesions, ecchymosis. Left 1st and second toenails thickened and deviated. Callouses Neuro: Cranial nerves intact. No cerebellar symptoms. Sensation intact to monofilament in bil feet.  Psych: Awake and oriented X 3, intermittently tearful affect, Insight and Judgment appropriate.   Medicare Attestation I have personally reviewed: The patient's medical and social history Their  use of alcohol, tobacco or illicit drugs Their current medications and supplements The patient's functional ability including ADLs,fall risks, home safety risks, cognitive, and hearing and visual impairment Diet and physical activities Evidence for depression or mood disorders  The patient's weight, height, BMI, and visual acuity have been recorded in the chart.  I have made referrals, counseling, and provided education to the patient based on review of the above and I have provided the patient with a written personalized care plan for preventive services.    Future Appointments  Date Time Provider [place]  07/06/2022 10:40 AM Larey Dresser, MD [facility] None  07/13/2022  2:15 PM Marzetta Board, DPM [facility]  09/12/2022 11:00 AM [name], MD [facility] None  12/29/2022 11:00 AM [facility] None  01/05/2023 10:30 AM Wyatt Portela, MD [facility] None  05/22/2023  2:00 PM Alycia Rossetti, NP [facility] None    Darrol Jump, NP 1:16 PM [facility]

## 2022-05-19 ENCOUNTER — Encounter (HOSPITAL_COMMUNITY): Payer: Self-pay | Admitting: Cardiology

## 2022-05-19 NOTE — Telephone Encounter (Signed)
Total time spent: 32 min

## 2022-05-19 NOTE — Telephone Encounter (Signed)
LM-05/19/22-Called pts. Daughter Para to r/s pts. Initial visit w/ CP. Unable to reach. LVM X3.  Total time spent: 2 min.

## 2022-05-19 NOTE — Telephone Encounter (Signed)
LM-05/19/22-Pt. Daughter returned call to schedule CP visit. Offered pt. Next available appointment (September) and pt. Daughter expressed concerns and unhappiness w/ the wait and stated no one has explained what our services entail. Apologized to pt. Daughter and explained that CP is only available on Tuesdays, and explained to pt. Daughter that our services included personalized care from CP w/ PCP and prevent hospitalizations, offer 24/7 contact for all care needs. Pt. Daughter requested to be emailed information more in depth and prefers to communicate via email d/t her job and she is not able to take calls.Emailed pt. Information about what our services entail. Will speak to CP and PTM in regards to appointment and will see if we can get Earl [name] in sooner. Pt. Daughter verbalized understanding and agreed.  Total time spent:

## 2022-05-25 ENCOUNTER — Other Ambulatory Visit (HOSPITAL_COMMUNITY): Payer: Self-pay

## 2022-05-31 ENCOUNTER — Other Ambulatory Visit (HOSPITAL_COMMUNITY): Payer: Self-pay

## 2022-06-06 ENCOUNTER — Other Ambulatory Visit (HOSPITAL_COMMUNITY): Payer: Self-pay

## 2022-06-07 ENCOUNTER — Other Ambulatory Visit (HOSPITAL_COMMUNITY): Payer: Self-pay

## 2022-06-08 ENCOUNTER — Other Ambulatory Visit (HOSPITAL_COMMUNITY): Payer: Self-pay

## 2022-06-10 ENCOUNTER — Telehealth: Payer: Self-pay

## 2022-06-10 NOTE — Telephone Encounter (Signed)
LM-06/10/22-Called pt. To r/s CP initial visit.Unable to reach pt. [place].  Total time spent: 2 min.

## 2022-06-14 ENCOUNTER — Other Ambulatory Visit (HOSPITAL_COMMUNITY): Payer: Self-pay

## 2022-06-17 ENCOUNTER — Telehealth: Payer: Self-pay | Admitting: Internal Medicine

## 2022-06-17 NOTE — Progress Notes (Signed)
  Chronic Care Management   Outreach Note  06/17/2022 Name: Earl [name] MRN: [id number] DOB: January 27, 1941  Referred by: [name], MD Reason for referral : No chief complaint on file.   An unsuccessful telephone outreach was attempted today. The patient was referred to the pharmacist for assistance with care management and care coordination.   Follow Up Plan:   [place]

## 2022-06-23 ENCOUNTER — Other Ambulatory Visit (HOSPITAL_COMMUNITY): Payer: Self-pay

## 2022-06-30 ENCOUNTER — Other Ambulatory Visit (HOSPITAL_COMMUNITY): Payer: Self-pay

## 2022-07-06 ENCOUNTER — Encounter (HOSPITAL_COMMUNITY): Payer: Self-pay | Admitting: Cardiology

## 2022-07-06 ENCOUNTER — Telehealth (HOSPITAL_COMMUNITY): Payer: Self-pay | Admitting: Pharmacist

## 2022-07-06 ENCOUNTER — Ambulatory Visit (HOSPITAL_COMMUNITY)
Admission: RE | Admit: 2022-07-06 | Discharge: 2022-07-06 | Disposition: A | Discharge status: Home / Self Care | Payer: Medicare Other | Source: Ambulatory Visit | Attending: Cardiology | Admitting: Cardiology

## 2022-07-06 ENCOUNTER — Other Ambulatory Visit (HOSPITAL_COMMUNITY): Payer: Self-pay

## 2022-07-06 VITALS — BP 98/60 | HR 60 | Wt 149.6 lb

## 2022-07-06 DIAGNOSIS — I5022 Chronic systolic (congestive) heart failure: Secondary | ICD-10-CM | POA: Diagnosis not present

## 2022-07-06 DIAGNOSIS — E854 Organ-limited amyloidosis: Secondary | ICD-10-CM | POA: Diagnosis not present

## 2022-07-06 DIAGNOSIS — I43 Cardiomyopathy in diseases classified elsewhere: Secondary | ICD-10-CM | POA: Diagnosis not present

## 2022-07-06 DIAGNOSIS — I480 Paroxysmal atrial fibrillation: Secondary | ICD-10-CM | POA: Diagnosis not present

## 2022-07-06 LAB — CBC
HCT: 36 % — ABNORMAL LOW (ref 39.0–52.0)
Hemoglobin: 12.4 g/dL — ABNORMAL LOW (ref 13.0–17.0)
MCH: 31.3 pg (ref 26.0–34.0)
MCHC: 34.4 g/dL (ref 30.0–36.0)
MCV: 90.9 fL (ref 80.0–100.0)
Platelets: 202 10*3/uL (ref 150–400)
RBC: 3.96 MIL/uL — ABNORMAL LOW (ref 4.22–5.81)
RDW: 13.8 % (ref 11.5–15.5)
WBC: 4.3 10*3/uL (ref 4.0–10.5)
nRBC: 0 % (ref 0.0–0.2)

## 2022-07-06 LAB — BASIC METABOLIC PANEL
Anion gap: 8 (ref 5–15)
BUN: 26 mg/dL — ABNORMAL HIGH (ref 8–23)
CO2: 26 mmol/L (ref 22–32)
Calcium: 9.4 mg/dL (ref 8.9–10.3)
Chloride: 105 mmol/L (ref 98–111)
Creatinine, Ser: 1.19 mg/dL (ref 0.61–1.24)
GFR, Estimated: >60 mL/min (ref >60)
Glucose, Bld: 107 mg/dL — ABNORMAL HIGH (ref 70–99)
Potassium: 4.4 mmol/L (ref 3.5–5.1)
Sodium: 139 mmol/L (ref 135–145)

## 2022-07-06 LAB — BRAIN NATRIURETIC PEPTIDE: B Natriuretic Peptide: 180.9 pg/mL — ABNORMAL HIGH (ref 0.0–100.0)

## 2022-07-06 NOTE — Patient Instructions (Signed)
There has been no changes to your medications.  Labs done today, your results will be available in MyChart, we will contact you for abnormal readings.  You have been referred to Dr. Nira Conn for Genetic Counseling. Her office will call you to arrange the appointment.  You have been referred to Dr. Narda Amber for neuropathy Evaluation. Her office will call you to arrange your appointment.  Your physician recommends that you schedule a follow-up appointment in: 3 months.  If you have any questions or concerns before your next appointment please send [place] a message through [place] or call our office at [phone].    TO LEAVE A MESSAGE FOR THE NURSE SELECT OPTION 2, PLEASE LEAVE A MESSAGE INCLUDING: YOUR NAME DATE OF BIRTH CALL BACK NUMBER REASON FOR CALL**this is important as we prioritize the call backs  YOU WILL RECEIVE A CALL BACK THE SAME DAY AS LONG AS YOU CALL BEFORE 4:00 PM  At the [facility], you and your health needs are our priority. As part of our continuing mission to provide you with exceptional heart care, we have created designated Provider Care Teams. These Care Teams include your primary Cardiologist (physician) and Advanced Practice Providers (APPs- Physician Assistants and Nurse Practitioners) who all work together to provide you with the care you need, when you need it.   You may see any of the following providers on your designated Care Team at your next follow up: Dr Glori Bickers Dr Haynes Kerns, NP Lyda Jester, [place] [facility] [place], [place] Audry Riles, PharmD   Please be sure to bring in all your medications bottles to every appointment.

## 2022-07-06 NOTE — Telephone Encounter (Signed)
Patient Advocate Encounter   Received notification from [employer] that prior authorization for Amvuttra is required.   PA submitted on CoverMyMeds Key [id number] Status is pending   Will continue to follow.   Audry Riles, PharmD, BCPS, BCCP, CPP Heart Failure Clinic Pharmacist [phone]

## 2022-07-07 ENCOUNTER — Telehealth (HOSPITAL_COMMUNITY): Payer: Self-pay | Admitting: Pharmacy Technician

## 2022-07-07 ENCOUNTER — Other Ambulatory Visit (HOSPITAL_COMMUNITY): Payer: Self-pay

## 2022-07-07 NOTE — Telephone Encounter (Signed)
Advanced Heart Failure Patient Advocate Encounter  Prior Authorization for Amvuttra has been approved.    PA#  [id number] Effective dates: 07/07/22 through 11/20/22  Patients co-pay is $5,793.94/90 days.   Copay through pharmacy benefit is high. Patient will likely need medication processed through his medical benefit or qualify for PAP. Further benefits investigation pending.    Audry Riles, PharmD, BCPS, BCCP, CPP Heart Failure Clinic Pharmacist [phone]

## 2022-07-07 NOTE — Progress Notes (Signed)
PCP: [name], MD Cardiology: Dr. Caryl Comes HF Cardiology: Dr. Aundra Dubin  81 y.o. with history of type 2 DM, HTN, CVA, paroxysmal atrial fibrillation, and chronic systolic CHF was referred by Dr. Caryl Comes for evaluation of CHF.  Patient is on Eliquis now for PAF, he had a CVA in 2018.  Echo at that time showed EF 55-60%.  In the summer of 2022, he developed progressive exertional dyspnea. He was seen in the ER and was noted to have mildly elevate HS-TnI without trend, thought to be due to volume overload from CHF.  Echo was done in 9/22 showing EF 25-30%, mild LVH, moderate RV enlargement with normal RV systolic function, PASP 65 mmHg, moderate TR, moderate AI, moderate MR, IVC dilated.  Cardiolite in 9/22 showed EF 27%, no ischemia.  Patient was started on Lasix due to volume overload and it was titrated up.  Patient does not drink ETOH, smoke, or use drugs.  He has not family history of heart disease.    RHC/LHC in 11/22 showed mild nonobstructive CAD, CI 4.36, optimized filling pressures.  Cardiac MRI in 1/23 showed LV EF 45%, mild RV dilation with RV EF 48%, mid-wall patchy LGE in the basal to mid septum, ECV 52% in septum, mild-moderate MR, mild-moderate AI. 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   He returns for followup of CHF.  Right leg remains weak from stroke.  Uses a walker generally.  Occasional lightheadedness but no falls.  SBP in 90s today.  Weight down 6 lbs. He is taking Lasix 80 qam/40 qpm.  He walks for about 1/2 mile three times/week. He fatigues but does not get short of breath on flat ground.  No orthopnea/PND.  No BRBPR/melena.  He has neuropathy in his feet with symptoms controlled by gabapentin.    Labs (10/22): LDL 44, TSH normal Labs (11/22): K 4, creatinine 1.17 Labs (12/22): K 4.3, creatinine 1.22 Labs (2/23): K 4.1, creatinine 1.19, hgb 12, myeloma panel with IgG monoclonal protein.  Labs (5/23): K  4.2, creatinine 1.19  ECG (personally reviewed): NSR, septal Qs  PMH: 1. Type 2 diabetes 2. HTN 3. Hyperlipidemia 4. Atrial fibrillation: Paroxysmal 5. 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 Right-sided weakness.  6. GERD 7. Hypothyroidism 8. Chronic systolic CHF: Echo (6/46) with EF 55-60%.  - Echo (9/22): EF 25-30%, mild LVH, moderate RV enlargement with normal RV systolic function, PASP 65 mmHg, moderate TR, moderate AI, moderate MR, IVC dilated.  - Cardiolite (9/22): EF 27%, no ischemia.  - LHC/RHC (11/22): Mild nonobstructive CAD; mean RA 2, PA 41/7 mean 22, mean PCWP 11, CI 4.36 - Cardiac MRI (1/23): LV EF 45%, mild RV dilation with RV EF 48%, mid-wall patchy LGE in the basal to mid septum, ECV 52% in septum, mild-moderate MR, mild-moderate AI.  9. IgG kappa MGUS since 2004: Bone marrow biopsy 2004 with 2% plasma cells.  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  11. Peripheral neuropathy  Social History   Socioeconomic History   Marital status: Widowed    Spouse name: Not on file   Number of children: 2   Years of education: Not on file   Highest education level: Not on file  Occupational History   Occupation: Retired-Director of Psychologist, occupational: RETIRED  Tobacco Use   Smoking status: Never   Smokeless tobacco: Never  Vaping Use  Vaping Use: Never used  Substance and Sexual Activity   Alcohol use: No   Drug use: No   Sexual activity: Not on file  Other Topics Concern   Not on file  Social History Narrative   Not on file   Social Determinants of Health   Financial Resource Strain: Not on file  Food Insecurity: Not on file  Transportation Needs: Not on file  Physical Activity: Not on file  Stress: Not on file  Social Connections: Not on file  Intimate Partner Violence: Not on file   Family History  Problem Relation Age of Onset   Sickle cell anemia Mother     Diabetes Father    Stomach cancer Father    Colon cancer Father    CAD Neg Hx    Esophageal cancer Neg Hx    Rectal cancer Neg Hx    ROS: All systems reviewed and negative except as per HPI.   Current Outpatient Medications  Medication Sig Dispense Refill   atorvastatin (LIPITOR) 40 MG tablet TAKE 1 TABLET BY MOUTH  DAILY FOR CHOLESTEROL 90 tablet 3   Blood Glucose Monitoring Suppl (ONE TOUCH ULTRA 2) w/Device KIT Check blood sugar 1 time a day-DX-E11.9 1 kit 0   Cholecalciferol (VITAMIN D PO) Take 1 tablet by mouth daily.      Cyanocobalamin (B-12) 50 MCG TABS Take 50 mcg by mouth daily.     dapagliflozin propanediol (FARXIGA) 10 MG TABS tablet Take 1 tablet (10 mg total) by mouth daily before breakfast. 90 tablet 3   ELIQUIS 5 MG TABS tablet TAKE 1 TABLET BY MOUTH  TWICE DAILY TO PREVENT  BLOOD CLOTS 180 tablet 3   ferrous sulfate 325 (65 FE) MG tablet Take 325 mg by mouth at bedtime.     finasteride (PROSCAR) 5 MG tablet TAKE 1 TABLET BY MOUTH  DAILY FOR PROSTATE 90 tablet 3   furosemide (LASIX) 40 MG tablet Take 80 mg by mouth daily. 40 mg in the evening     gabapentin (NEURONTIN) 300 MG capsule Take 600 mg by mouth at bedtime.     glipiZIDE (GLUCOTROL) 5 MG tablet Take 1/2 tablet  2 x /day  with Meals  for Diabetes 90 tablet 3   Lancets (ONETOUCH DELICA PLUS [id number]) MISC CHECK BLOOD SUGAR ONCE A DAY. 100 each 3   Lancets Misc. (ONE TOUCH SURESOFT) MISC Check blood sugar 1 time a day-DX-E11.91 1 each 0   levothyroxine (SYNTHROID) 50 MCG tablet Take  1 tablet  Daily  on an empty stomach with only water for 30 minutes & no Antacid meds, Calcium or Magnesium for 4 hours & avoid Biotin                                          /                              TAKE                      BY                          MOUTH 90 tablet 3   Magnesium 250 MG TABS Take 250 mg by mouth daily.      metFORMIN (GLUCOPHAGE-XR) 500 MG  24 hr tablet Take  2 tablets  2 x /day  with Meals  for Diabetes                                                            /                                  TAKE 2 TABLETS BY MOUTH 360 tablet 3   metoprolol succinate (TOPROL-XL) 50 MG 24 hr tablet TAKE 1 TABLET BY MOUTH AT  BEDTIME 90 tablet 3   Multiple Vitamin (MULTIVITAMIN) capsule Take 1 capsule by mouth daily.     ONETOUCH ULTRA test strip CHECK BLOOD SUGAR ONCE A DAY. 100 strip 3   sacubitril-valsartan (ENTRESTO) 24-26 MG Take 1 tablet by mouth 2 (two) times daily. 180 tablet 3   spironolactone (ALDACTONE) 25 MG tablet Take 1 tablet (25 mg total) by mouth at bedtime. 90 tablet 3   Tafamidis (VYNDAMAX) 61 MG CAPS Take 1 capsule by mouth daily. 30 capsule 11   triamcinolone cream (KENALOG) 0.1 % Apply 1 Application topically as needed.     trolamine salicylate (ASPERCREME) 10 % cream Apply 1 application topically daily as needed for muscle pain.     vitamin C (ASCORBIC ACID) 500 MG tablet Take 500 mg by mouth daily.     No current facility-administered medications for this encounter.   BP 98/60   Pulse 60   Wt 67.9 kg (149 lb 9.6 oz)   SpO2 100%   BMI 20.86 kg/m  General: NAD Neck: No JVD, no thyromegaly or thyroid nodule.  Lungs: Clear to auscultation bilaterally with normal respiratory effort. CV: Nondisplaced PMI.  Heart regular S1/S2, no S3/S4, 1/6 SEM RUSB.  1+ ankle edema.  No carotid bruit.  Normal pedal pulses.  Abdomen: Soft, nontender, no hepatosplenomegaly, no distention.  Skin: Intact without lesions or rashes.  Neurologic: Alert and oriented x 3.  Psych: Normal affect. Extremities: No clubbing or cyanosis.  HEENT: Normal.   Assessment/Plan: 1. Chronic systolic CHF:  Nonischemic cardiomyopathy.  Echo in 9/22 with EF 25-30%, mild LVH, moderate RV enlargement with normal RV systolic function, PASP 65 mmHg, moderate TR, moderate AI, moderate MR, IVC dilated.  LHC/RHC in 11/22 showed mild nonobstructive CAD with optimized filling pressures and preserved cardiac output.  No history of drugs/ETOH.   No family history of cardiomyopathy.  Cardiac MRI in 1/23 showed LV EF 45%, mild RV dilation with RV EF 48%, mid-wall patchy LGE in the basal to mid septum, ECV 52% in septum, mild-moderate MR, mild-moderate AI.  PYP scan was suggestive of TTR cardiac amyloidosis.  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 On exam, he is not significantly volume overloaded.  NYHA class II-III symptoms.   - Continue Entresto 24/26 bid, no BP room to increase.    - Continue Lasix 80 qam/40 qpm.  BMET today.  - Continue Farxiga 10 mg daily.  - Continue Toprol XL to 50 mg qhs, no BP room to increase.   - Continue spironolactone 25 mg qhs.   - Narrow QRS, not candidate for CRT.  EF on cMRI out of range for ICD.   2. Atrial  fibrillation: Paroxysmal.  He is in NSR today. History of CVA.  - Continue Eliquis. CBC today.  3. Hyperlipidemia: Good lipids in 10/22.  - Continue atorvastatin.  4. Cardiac amyloidosis: PYP scan suggestive of TTR cardiac amyloidosis, cardiac MRI also suggestive of amyloidosis. He has peripheral neuropathy and autonomic neuropathy (orthostatic symptoms). 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  - Continue tafamidis.  - I will refer him for formal neurology evaluation of peripheral neuropathy, then will try to get him on vutrisiran.  - I will refer to Dr. Broadus John for genetics evaluation.   Followup 3 months with APP.   Loralie Champagne 07/07/2022

## 2022-07-07 NOTE — Telephone Encounter (Signed)
Advanced Heart Failure Patient Advocate Encounter  Referral paperwork for Amvuttra mailed out to patient.

## 2022-07-13 ENCOUNTER — Ambulatory Visit (INDEPENDENT_AMBULATORY_CARE_PROVIDER_SITE_OTHER): Payer: Medicare Other | Admitting: Podiatry

## 2022-07-13 ENCOUNTER — Encounter: Payer: Self-pay | Admitting: Podiatry

## 2022-07-13 DIAGNOSIS — L84 Corns and callosities: Secondary | ICD-10-CM

## 2022-07-13 DIAGNOSIS — M79676 Pain in unspecified toe(s): Secondary | ICD-10-CM | POA: Diagnosis not present

## 2022-07-13 DIAGNOSIS — E1142 Type 2 diabetes mellitus with diabetic polyneuropathy: Secondary | ICD-10-CM

## 2022-07-13 DIAGNOSIS — B351 Tinea unguium: Secondary | ICD-10-CM

## 2022-07-18 ENCOUNTER — Encounter (HOSPITAL_COMMUNITY): Payer: Self-pay | Admitting: Cardiology

## 2022-07-19 ENCOUNTER — Other Ambulatory Visit (HOSPITAL_COMMUNITY): Payer: Self-pay

## 2022-07-20 NOTE — Progress Notes (Signed)
  Subjective:  Patient ID: Earl [name], male    DOB: [birth year],  MRN: [id number]  Earl [name] presents to clinic today for at risk foot care with history of diabetic neuropathy and preulcerative lesion(s) left great toe and painful mycotic toenails that limit ambulation. Painful toenails interfere with ambulation. Aggravating factors include wearing enclosed shoe gear. Pain is relieved with periodic professional debridement. Painful porokeratotic lesions are aggravated when weightbearing with and without shoegear. Pain is relieved with periodic professional debridement.  Patient states blood glucose was 114 mg/dl yesterday. Last known  HgA1c was 6.7%.    Patient's daughter is present during today's visit. Daughter states Earl [name] has been wearing his dress shoes to church. He wears sandals the remainder of the days of the week.They have been applying Betadine solution to the left great toe daily.  New problem(s): None.   PCP is [name], MD , and last visit was 5 months ago.  Allergies  Allergen Reactions   Penicillins Other (See Comments)    Did it involve swelling of the face/tongue/throat, SOB, or low BP? Unknown Did it involve sudden or severe rash/hives, skin peeling, or any reaction on the inside of your mouth or nose? Unknown Did you need to seek medical attention at a hospital or doctor's office? No When did it last happen?    Over 64 Years Ago   If all above answers are "NO", may proceed with cephalosporin use.      Review of Systems: Negative except as noted in the HPI.  Objective: No changes noted in today's physical examination. [name] is a pleasant 81 y.o. y.o. male in NAD. AAO x 3.  Neurovascular Examination: Capillary refill time to digits immediate b/l. Palpable pedal pulses b/l LE. Pedal hair sparse. No pain with calf compression b/l. Lower extremity skin temperature gradient within normal limits. No edema noted b/l LE. No ischemia  or gangrene noted b/l LE. No cyanosis or clubbing noted b/l LE.  Protective sensation intact 5/5 intact bilaterally with 10g monofilament b/l. Vibratory sensation intact b/l.  Dermatological:  Pedal skin is warm and supple b/l LE. No open wounds b/l LE. No interdigital macerations noted b/l LE. Toenails 1-5 bilaterally elongated, discolored, dystrophic, thickened, and crumbly with subungual debris and tenderness to dorsal palpation. Hyperkeratotic lesion noted distal tip of left hallux with subdermal hemorrhage No erythema, no edema, no drainage, no fluctuance. Lesion is improved in appearance.  Musculoskeletal:  Muscle strength 5/5 to all lower extremity muscle groups bilaterally. No pain, crepitus or joint limitation noted with ROM bilateral LE.  Assessment/Plan: 1. Pain due to onychomycosis of toenail   2. Pre-ulcerative calluses   3. Diabetic polyneuropathy associated with type 2 diabetes mellitus ([place])   -Examined patient. -Recommended open toe compression hose to reduce pressure to distal tip of left great toe. Wool socks for the winter months. -Mycotic toenails 1-5 bilaterally were debrided in length and girth with sterile nail nippers and dremel without incident. -Preulcerative lesion pared distal tip of left great toe. Total number pared=1. -Patient/POA to call should there be question/concern in the interim.   Return in about 3 months (around 10/13/2022).  Marzetta Board, DPM

## 2022-07-20 NOTE — Telephone Encounter (Signed)
Advanced Heart Failure Patient Advocate Encounter  Sent in referral via fax.  Will follow up.

## 2022-07-26 ENCOUNTER — Other Ambulatory Visit (HOSPITAL_COMMUNITY): Payer: Self-pay

## 2022-08-03 ENCOUNTER — Encounter: Payer: Self-pay | Admitting: Neurology

## 2022-08-03 ENCOUNTER — Other Ambulatory Visit (HOSPITAL_COMMUNITY): Payer: Self-pay

## 2022-08-05 ENCOUNTER — Other Ambulatory Visit (HOSPITAL_COMMUNITY): Payer: Self-pay

## 2022-08-08 ENCOUNTER — Other Ambulatory Visit: Payer: Self-pay

## 2022-08-08 MED ORDER — ONETOUCH DELICA PLUS LANCET30G MISC: 3 refills | Status: DC

## 2022-08-17 ENCOUNTER — Ambulatory Visit: Payer: Medicare Other | Admitting: Neurology

## 2022-08-17 ENCOUNTER — Encounter: Payer: Self-pay | Admitting: Neurology

## 2022-08-17 VITALS — BP 106/60 | HR 60 | Resp 20 | Ht 71.0 in | Wt 161.0 lb

## 2022-08-17 DIAGNOSIS — R2681 Unsteadiness on feet: Secondary | ICD-10-CM | POA: Diagnosis not present

## 2022-08-17 DIAGNOSIS — R258 Other abnormal involuntary movements: Secondary | ICD-10-CM

## 2022-08-17 DIAGNOSIS — R292 Abnormal reflex: Secondary | ICD-10-CM

## 2022-08-17 DIAGNOSIS — Z8673 Personal history of transient ischemic attack (TIA), and cerebral infarction without residual deficits: Secondary | ICD-10-CM | POA: Diagnosis not present

## 2022-08-17 DIAGNOSIS — G63 Polyneuropathy in diseases classified elsewhere: Secondary | ICD-10-CM

## 2022-08-17 DIAGNOSIS — I43 Cardiomyopathy in diseases classified elsewhere: Secondary | ICD-10-CM | POA: Diagnosis not present

## 2022-08-17 DIAGNOSIS — E854 Organ-limited amyloidosis: Secondary | ICD-10-CM

## 2022-08-17 NOTE — Progress Notes (Signed)
[facility] Clinic Note - Initial Visit   Date: 08/17/2022   Earl [name] MRN: [id number] DOB: Dec 13, 1940   Dear Dr. Shirlee Latch:  Thank you for your kind referral of Earl [name] for consultation of neuropathy. Although his history is well known to you, please allow [place] to reiterate it for the purpose of our medical record. The patient was accompanied to the clinic by daughter who also provides collateral information.     Earl [name] is a 81 y.o. 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 - NCS/EMG of the left arm and leg  - Agree with starting Amvuttra to minimize progression of neuropathy  - Taper gabapentin as symptoms are predominately numbness   - Start home physical therapy for balance training   - He is very high risk for falls.  Fall precautions discussed, he is compliant with always using a walker   2.  Generalized hyperreflexia, clonus, and right leg spasticity.  Although left cerebellar stroke may account for right sided symptoms, it would not explain left side reflexes to be brisk.  Therefore, I will order MRI cervical spine wo contrast to evaluate for structural pathology.   3.  History of left cerebellar stroke with residual hemiataxia, worse in the right leg.  Return to clinic in 3 months  ------------------------------------------------------------- History of present illness: He was diagnosed with transthyretin amyloidosis based on positive PYP scan and confirmed to have V142I genetic  mutation.  He was referred for neurological evaluation of his neuropathy.  Patient has a long history of neuropathy, which initially manifested with burning pain in the feet.  However, over the years, he no longer has any pain.  He takes gabapentin 600mg  at bedtime.   Now, he has predominately numbness in the feet and lower legs.  He has weakness of the right leg from stroke.  He has poor balance and has been using a rigid walker for the past 1.5 year.  He would like to use a cane, but it's too unsteady.  He uses a transport chair when outside of the home.  He had one fall this year. Diabetes is well-controlled.   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    He lives in a one level home and daughter is the primary care giver.   Nonsmoker and never drank alcohol.  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     Out-side paper records, electronic medical record, and images have been reviewed where available and summarized as:  Lab Results  Component Value Date   HGBA1C 6.0 (H) 09/10/2021   Lab Results  Component Value Date   VITAMINB12 1,379 (H) 11/18/2019   Lab Results  Component Value Date   TSH 0.69 09/10/2021   Lab Results  Component Value Date   ESRSEDRATE 10 01/18/2007    Past Medical History:  Diagnosis Date   Adenomatous colon polyp    Allergy    Anal fissure    Arthritis    BPH (benign prostatic hyperplasia)    Diabetes mellitus (HCC)    GERD (gastroesophageal reflux disease)    Hyperlipidemia  Hypertension    Hypothyroidism    Other testicular hypofunction    Stroke [facility])     Past Surgical History:  Procedure Laterality Date   CIRCUMCISION N/A 06/11/2019   Procedure: CIRCUMCISION ADULT;  Surgeon: [name], MD;  Location: [facility];  Service: Urology;  Laterality: N/A;   COLONOSCOPY     INGUINAL HERNIA REPAIR Bilateral    1968 and 2004   LOOP RECORDER INSERTION N/A  07/28/2017   Procedure: LOOP RECORDER INSERTION;  Surgeon: Deboraha Sprang, MD;  Location: [facility];  Service: Cardiovascular;  Laterality: N/A;   RIGHT/LEFT HEART CATH AND CORONARY ANGIOGRAPHY N/A 10/07/2021   Procedure: RIGHT/LEFT HEART CATH AND CORONARY ANGIOGRAPHY;  Surgeon: Larey Dresser, MD;  Location: [facility];  Service: Cardiovascular;  Laterality: N/A;   TEE WITHOUT CARDIOVERSION N/A 07/28/2017   Procedure: TRANSESOPHAGEAL ECHOCARDIOGRAM (TEE);  Surgeon: Sueanne Margarita, MD;  Location: [facility] ENDOSCOPY;  Service: Cardiovascular;  Laterality: N/A;     Medications:  Outpatient Encounter Medications as of 08/17/2022  Medication Sig   atorvastatin (LIPITOR) 40 MG tablet TAKE 1 TABLET BY MOUTH  DAILY FOR CHOLESTEROL   Blood Glucose Monitoring Suppl (ONE TOUCH ULTRA 2) w/Device KIT Check blood sugar 1 time a day-DX-E11.9   Cholecalciferol (VITAMIN D PO) Take 1 tablet by mouth daily.    Cyanocobalamin (B-12) 50 MCG TABS Take 50 mcg by mouth daily.   dapagliflozin propanediol (FARXIGA) 10 MG TABS tablet Take 1 tablet (10 mg total) by mouth daily before breakfast.   ELIQUIS 5 MG TABS tablet TAKE 1 TABLET BY MOUTH  TWICE DAILY TO PREVENT  BLOOD CLOTS   ferrous sulfate 325 (65 FE) MG tablet Take 325 mg by mouth at bedtime.   finasteride (PROSCAR) 5 MG tablet TAKE 1 TABLET BY MOUTH  DAILY FOR PROSTATE   furosemide (LASIX) 40 MG tablet Take 80 mg by mouth daily. 40 mg in the evening   gabapentin (NEURONTIN) 300 MG capsule Take 600 mg by mouth at bedtime.   glipiZIDE (GLUCOTROL) 5 MG tablet Take 1/2 tablet  2 x /day  with Meals  for Diabetes   Lancets (ONETOUCH DELICA PLUS [id number]) MISC CHECK BLOOD SUGAR ONCE A DAY.   Lancets Misc. (ONE TOUCH SURESOFT) MISC Check blood sugar 1 time a day-DX-E11.91   levothyroxine (SYNTHROID) 50 MCG tablet Take  1 tablet  Daily  on an empty stomach with only water for 30 minutes & no Antacid meds, Calcium or Magnesium for 4 hours & avoid Biotin                                           /                              TAKE                      BY                          MOUTH   Magnesium 250 MG TABS Take 250 mg by mouth daily.    metFORMIN (GLUCOPHAGE-XR) 500 MG 24 hr tablet Take  2 tablets  2 x /day  with Meals  for Diabetes                                                           /  TAKE 2 TABLETS BY MOUTH   metoprolol succinate (TOPROL-XL) 50 MG 24 hr tablet TAKE 1 TABLET BY MOUTH AT  BEDTIME   Multiple Vitamin (MULTIVITAMIN) capsule Take 1 capsule by mouth daily.   ONETOUCH ULTRA test strip CHECK BLOOD SUGAR ONCE A DAY.   sacubitril-valsartan (ENTRESTO) 24-26 MG Take 1 tablet by mouth 2 (two) times daily.   spironolactone (ALDACTONE) 25 MG tablet Take 1 tablet (25 mg total) by mouth at bedtime.   Tafamidis (VYNDAMAX) 61 MG CAPS Take 1 capsule by mouth daily.   triamcinolone cream (KENALOG) 0.1 % Apply 1 Application topically as needed.   trolamine salicylate (ASPERCREME) 10 % cream Apply 1 application topically daily as needed for muscle pain.   vitamin C (ASCORBIC ACID) 500 MG tablet Take 500 mg by mouth daily.   No facility-administered encounter medications on file as of 08/17/2022.    Allergies:  Allergies  Allergen Reactions   Penicillins Other (See Comments)    Did it involve swelling of the face/tongue/throat, SOB, or low BP? Unknown Did it involve sudden or severe rash/hives, skin peeling, or any reaction on the inside of your mouth or nose? Unknown Did you need to seek medical attention at a hospital or doctor's office? No When did it last happen?    Over 30 Years Ago   If all above answers are "NO", may proceed with cephalosporin use.      Family History: Family History  Problem Relation Age of Onset   Sickle cell anemia Mother    Diabetes Father    Stomach cancer Father    Colon cancer Father    CAD Neg Hx    Esophageal cancer Neg Hx    Rectal cancer Neg Hx     Social  History: Social History   Tobacco Use   Smoking status: Never   Smokeless tobacco: Never  Vaping Use   Vaping Use: Never used  Substance Use Topics   Alcohol use: No   Drug use: No   Social History   Social History Narrative   Right handed   Drinks caffeine prn   One story home    Vital Signs:  BP 106/60   Pulse 60   Resp 20   Ht $R'5\' 11"'QH$  (1.803 m)   Wt 161 lb (73 kg)   SpO2 98%   BMI 22.45 kg/m   Neurological Exam: MENTAL STATUS including orientation to time, place, person, recent and remote memory, attention span and concentration, language, and fund of knowledge is normal.  Speech is not dysarthric.  CRANIAL NERVES: II:  No visual field defects.   III-IV-VI: Pupils equal round and reactive to light.  Normal conjugate, extra-ocular eye movements in all directions of gaze.  No nystagmus.  No ptosis.   V:  Normal facial sensation.    VII:  Normal facial symmetry and movements.   VIII:  Normal hearing and vestibular function.   IX-X:  Normal palatal movement.   XI:  Normal shoulder shrug and head rotation.   XII:  Normal tongue strength and range of motion, no deviation or fasciculation.  MOTOR:  Severe bilateral ABP atrophy, no fasciculations or abnormal movements.  No pronator drift.   Upper Extremity:  Right  Left  Deltoid  5/5   5/5   Biceps  5/5   5/5   Triceps  5/5   5/5   Wrist extensors  5/5   5/5   Wrist flexors  5/5   5/5   Finger extensors  5/5   5/5   Finger flexors  5/5   5/5   Dorsal interossei  5/5   5/5   Abductor pollicis  3/5   4/5   Tone (Ashworth scale)  0  0   Lower Extremity:  Right  Left  Hip flexors  4/5   5/5   Knee flexors  4/5   5/5   Knee extensors  4/5   5/5   Dorsiflexors  4/5   5/5   Plantarflexors  4/5   5/5   Toe extensors  4/5   5/5   Toe flexors  4/5   5/5   Tone (Ashworth scale)  0  0   MSRs:                                           Right        Left brachioradialis 3+  3+  biceps 3+  3+  triceps 3+  3+   patellar 3+  3+  ankle jerk 3+  3+  Hoffman yes  yes  plantar response up  up  3-4 beat bilateral ankle clonus.  SENSORY:  Vibration, temperature and pin prick absent below the ankles and reduced at the knees.     COORDINATION/GAIT: Normfal finger-to- nose-finger.  Intact rapid alternating movements bilaterally. Very unsteady with standing, therefore did not assess gait.  Patient arrived in wheelchair.    Thank you for allowing me to participate in patient's care.  If I can answer any additional questions, I would be pleased to do so.    Sincerely,    Jewel Venditto K. Posey Pronto, DO

## 2022-08-17 NOTE — Patient Instructions (Addendum)
Start home physical therapy  MRI cervical spine without contrast  Nerve testing of the left arm and leg  You can try to reduce gabapentin to 1 tablet at bedtime for two weeks.  If you do not have worsening pain, you can stop it.   Return to clinic in 3 months

## 2022-08-19 ENCOUNTER — Telehealth: Payer: Self-pay | Admitting: Neurology

## 2022-08-19 DIAGNOSIS — I10 Essential (primary) hypertension: Secondary | ICD-10-CM | POA: Diagnosis not present

## 2022-08-19 DIAGNOSIS — E8582 Wild-type transthyretin-related (ATTR) amyloidosis: Secondary | ICD-10-CM | POA: Diagnosis not present

## 2022-08-19 DIAGNOSIS — I69393 Ataxia following cerebral infarction: Secondary | ICD-10-CM | POA: Diagnosis not present

## 2022-08-19 DIAGNOSIS — I6930 Unspecified sequelae of cerebral infarction: Secondary | ICD-10-CM | POA: Diagnosis not present

## 2022-08-19 DIAGNOSIS — E119 Type 2 diabetes mellitus without complications: Secondary | ICD-10-CM | POA: Diagnosis not present

## 2022-08-19 DIAGNOSIS — I509 Heart failure, unspecified: Secondary | ICD-10-CM | POA: Diagnosis not present

## 2022-08-19 NOTE — Telephone Encounter (Signed)
Called and provided verbal orders  

## 2022-08-19 NOTE — Telephone Encounter (Signed)
OK to give verbal order 

## 2022-08-19 NOTE — Telephone Encounter (Signed)
[place] called in to get updated orders for PT 1 time a week for 1 week, 2 week 3, 1 week 3.

## 2022-08-24 ENCOUNTER — Telehealth (HOSPITAL_COMMUNITY): Payer: Self-pay | Admitting: Pharmacist

## 2022-08-24 ENCOUNTER — Other Ambulatory Visit (HOSPITAL_COMMUNITY): Payer: Self-pay

## 2022-08-24 DIAGNOSIS — E1122 Type 2 diabetes mellitus with diabetic chronic kidney disease: Secondary | ICD-10-CM | POA: Diagnosis not present

## 2022-08-24 DIAGNOSIS — N182 Chronic kidney disease, stage 2 (mild): Secondary | ICD-10-CM | POA: Diagnosis not present

## 2022-08-24 DIAGNOSIS — I509 Heart failure, unspecified: Secondary | ICD-10-CM | POA: Diagnosis not present

## 2022-08-24 DIAGNOSIS — E8582 Wild-type transthyretin-related (ATTR) amyloidosis: Secondary | ICD-10-CM | POA: Diagnosis not present

## 2022-08-24 DIAGNOSIS — E1141 Type 2 diabetes mellitus with diabetic mononeuropathy: Secondary | ICD-10-CM | POA: Diagnosis not present

## 2022-08-24 DIAGNOSIS — I130 Hypertensive heart and chronic kidney disease with heart failure and stage 1 through stage 4 chronic kidney disease, or unspecified chronic kidney disease: Secondary | ICD-10-CM | POA: Diagnosis not present

## 2022-08-24 NOTE — Telephone Encounter (Signed)
Advanced Heart Failure Patient Advocate Encounter  Prior authorization for Amvuttra is required through medical benefit. Submitted letter of medical necessity and chart notes to [facility], who will be submitting the PA.   Audry Riles, PharmD, BCPS, BCCP, CPP Heart Failure Clinic Pharmacist [phone]

## 2022-08-26 ENCOUNTER — Other Ambulatory Visit (HOSPITAL_COMMUNITY): Payer: Self-pay

## 2022-08-26 DIAGNOSIS — I130 Hypertensive heart and chronic kidney disease with heart failure and stage 1 through stage 4 chronic kidney disease, or unspecified chronic kidney disease: Secondary | ICD-10-CM | POA: Diagnosis not present

## 2022-08-26 DIAGNOSIS — E1122 Type 2 diabetes mellitus with diabetic chronic kidney disease: Secondary | ICD-10-CM | POA: Diagnosis not present

## 2022-08-26 DIAGNOSIS — I509 Heart failure, unspecified: Secondary | ICD-10-CM | POA: Diagnosis not present

## 2022-08-26 DIAGNOSIS — E8582 Wild-type transthyretin-related (ATTR) amyloidosis: Secondary | ICD-10-CM | POA: Diagnosis not present

## 2022-08-26 DIAGNOSIS — N182 Chronic kidney disease, stage 2 (mild): Secondary | ICD-10-CM | POA: Diagnosis not present

## 2022-08-26 DIAGNOSIS — E1141 Type 2 diabetes mellitus with diabetic mononeuropathy: Secondary | ICD-10-CM | POA: Diagnosis not present

## 2022-08-29 DIAGNOSIS — N182 Chronic kidney disease, stage 2 (mild): Secondary | ICD-10-CM | POA: Diagnosis not present

## 2022-08-29 DIAGNOSIS — E1141 Type 2 diabetes mellitus with diabetic mononeuropathy: Secondary | ICD-10-CM | POA: Diagnosis not present

## 2022-08-29 DIAGNOSIS — E8582 Wild-type transthyretin-related (ATTR) amyloidosis: Secondary | ICD-10-CM | POA: Diagnosis not present

## 2022-08-29 DIAGNOSIS — I509 Heart failure, unspecified: Secondary | ICD-10-CM | POA: Diagnosis not present

## 2022-08-29 DIAGNOSIS — E1122 Type 2 diabetes mellitus with diabetic chronic kidney disease: Secondary | ICD-10-CM | POA: Diagnosis not present

## 2022-08-29 DIAGNOSIS — I130 Hypertensive heart and chronic kidney disease with heart failure and stage 1 through stage 4 chronic kidney disease, or unspecified chronic kidney disease: Secondary | ICD-10-CM | POA: Diagnosis not present

## 2022-08-31 ENCOUNTER — Other Ambulatory Visit (HOSPITAL_COMMUNITY): Payer: Self-pay

## 2022-08-31 DIAGNOSIS — E1141 Type 2 diabetes mellitus with diabetic mononeuropathy: Secondary | ICD-10-CM | POA: Diagnosis not present

## 2022-08-31 DIAGNOSIS — E8582 Wild-type transthyretin-related (ATTR) amyloidosis: Secondary | ICD-10-CM | POA: Diagnosis not present

## 2022-08-31 DIAGNOSIS — I509 Heart failure, unspecified: Secondary | ICD-10-CM | POA: Diagnosis not present

## 2022-08-31 DIAGNOSIS — N182 Chronic kidney disease, stage 2 (mild): Secondary | ICD-10-CM | POA: Diagnosis not present

## 2022-08-31 DIAGNOSIS — E1122 Type 2 diabetes mellitus with diabetic chronic kidney disease: Secondary | ICD-10-CM | POA: Diagnosis not present

## 2022-08-31 DIAGNOSIS — I130 Hypertensive heart and chronic kidney disease with heart failure and stage 1 through stage 4 chronic kidney disease, or unspecified chronic kidney disease: Secondary | ICD-10-CM | POA: Diagnosis not present

## 2022-09-01 ENCOUNTER — Telehealth (HOSPITAL_COMMUNITY): Payer: Self-pay | Admitting: Pharmacy Technician

## 2022-09-01 NOTE — Telephone Encounter (Signed)
Advanced Heart Failure Patient Advocate Encounter   Patient was automatically approved to receive Farxiga from [employer] through 11/21/23.  Document scanned to chart. Patient will be notified via mail. Patient currently using [employer] assistance through previous enrollment end date. No further action needed at this time.  [name], CPhT

## 2022-09-02 NOTE — Telephone Encounter (Signed)
Advanced Heart Failure Patient Advocate Encounter   Prior authorization for Amvuttra through medical benefit was denied. Denied because patient is also taking Vyndamax. Appeal submitted.   Audry Riles, PharmD, BCPS, BCCP, CPP Heart Failure Clinic Pharmacist [phone]

## 2022-09-03 ENCOUNTER — Ambulatory Visit (HOSPITAL_COMMUNITY)
Admission: RE | Admit: 2022-09-03 | Discharge: 2022-09-03 | Disposition: A | Discharge status: Home / Self Care | Payer: Medicare Other | Source: Ambulatory Visit | Attending: Neurology | Admitting: Neurology

## 2022-09-03 DIAGNOSIS — R292 Abnormal reflex: Secondary | ICD-10-CM | POA: Diagnosis not present

## 2022-09-03 DIAGNOSIS — R258 Other abnormal involuntary movements: Secondary | ICD-10-CM

## 2022-09-03 DIAGNOSIS — E854 Organ-limited amyloidosis: Secondary | ICD-10-CM

## 2022-09-03 DIAGNOSIS — R2681 Unsteadiness on feet: Secondary | ICD-10-CM | POA: Diagnosis not present

## 2022-09-03 DIAGNOSIS — Z8673 Personal history of transient ischemic attack (TIA), and cerebral infarction without residual deficits: Secondary | ICD-10-CM | POA: Diagnosis not present

## 2022-09-03 DIAGNOSIS — G63 Polyneuropathy in diseases classified elsewhere: Secondary | ICD-10-CM | POA: Diagnosis not present

## 2022-09-03 DIAGNOSIS — I43 Cardiomyopathy in diseases classified elsewhere: Secondary | ICD-10-CM | POA: Diagnosis not present

## 2022-09-03 DIAGNOSIS — G629 Polyneuropathy, unspecified: Secondary | ICD-10-CM | POA: Diagnosis not present

## 2022-09-03 DIAGNOSIS — M47812 Spondylosis without myelopathy or radiculopathy, cervical region: Secondary | ICD-10-CM | POA: Diagnosis not present

## 2022-09-04 ENCOUNTER — Other Ambulatory Visit: Payer: Self-pay | Admitting: Nurse Practitioner

## 2022-09-04 ENCOUNTER — Other Ambulatory Visit: Payer: Self-pay | Admitting: Internal Medicine

## 2022-09-04 DIAGNOSIS — E1122 Type 2 diabetes mellitus with diabetic chronic kidney disease: Secondary | ICD-10-CM

## 2022-09-05 DIAGNOSIS — I509 Heart failure, unspecified: Secondary | ICD-10-CM | POA: Diagnosis not present

## 2022-09-05 DIAGNOSIS — E8582 Wild-type transthyretin-related (ATTR) amyloidosis: Secondary | ICD-10-CM | POA: Diagnosis not present

## 2022-09-05 DIAGNOSIS — E1141 Type 2 diabetes mellitus with diabetic mononeuropathy: Secondary | ICD-10-CM | POA: Diagnosis not present

## 2022-09-05 DIAGNOSIS — E1122 Type 2 diabetes mellitus with diabetic chronic kidney disease: Secondary | ICD-10-CM | POA: Diagnosis not present

## 2022-09-05 DIAGNOSIS — I130 Hypertensive heart and chronic kidney disease with heart failure and stage 1 through stage 4 chronic kidney disease, or unspecified chronic kidney disease: Secondary | ICD-10-CM | POA: Diagnosis not present

## 2022-09-05 DIAGNOSIS — N182 Chronic kidney disease, stage 2 (mild): Secondary | ICD-10-CM | POA: Diagnosis not present

## 2022-09-05 NOTE — Telephone Encounter (Signed)
Advanced Heart Failure Patient Advocate Encounter  Prior Authorization for Amvuttra through medical benefit has been approved.    Reference# [id number] Effective dates: 09/02/22 through 09/03/23  Sent approval information to [employer] to they can triage obtaining patient's therapy. Will likely be obtained by [facility].   Audry Riles, PharmD, BCPS, BCCP, CPP Heart Failure Clinic Pharmacist [phone]

## 2022-09-07 ENCOUNTER — Telehealth: Payer: Self-pay | Admitting: Neurology

## 2022-09-07 DIAGNOSIS — I130 Hypertensive heart and chronic kidney disease with heart failure and stage 1 through stage 4 chronic kidney disease, or unspecified chronic kidney disease: Secondary | ICD-10-CM | POA: Diagnosis not present

## 2022-09-07 DIAGNOSIS — E1141 Type 2 diabetes mellitus with diabetic mononeuropathy: Secondary | ICD-10-CM | POA: Diagnosis not present

## 2022-09-07 DIAGNOSIS — N182 Chronic kidney disease, stage 2 (mild): Secondary | ICD-10-CM | POA: Diagnosis not present

## 2022-09-07 DIAGNOSIS — E8582 Wild-type transthyretin-related (ATTR) amyloidosis: Secondary | ICD-10-CM | POA: Diagnosis not present

## 2022-09-07 DIAGNOSIS — E1122 Type 2 diabetes mellitus with diabetic chronic kidney disease: Secondary | ICD-10-CM | POA: Diagnosis not present

## 2022-09-07 DIAGNOSIS — I509 Heart failure, unspecified: Secondary | ICD-10-CM | POA: Diagnosis not present

## 2022-09-07 NOTE — Telephone Encounter (Signed)
Pt's daughter called in returning a call for results

## 2022-09-08 NOTE — Telephone Encounter (Signed)
See result notes. 

## 2022-09-08 NOTE — Telephone Encounter (Signed)
Left message on machine for patient to call back.

## 2022-09-09 ENCOUNTER — Other Ambulatory Visit (HOSPITAL_COMMUNITY): Payer: Self-pay

## 2022-09-11 ENCOUNTER — Encounter: Payer: Self-pay | Admitting: Internal Medicine

## 2022-09-11 DIAGNOSIS — I5022 Chronic systolic (congestive) heart failure: Secondary | ICD-10-CM | POA: Insufficient documentation

## 2022-09-11 NOTE — Patient Instructions (Signed)
Due to recent changes in healthcare laws, you may see the results of your imaging and laboratory studies on MyChart before your provider has had a chance to review them.  We understand that in some cases there may be results that are confusing or concerning to you. Not all laboratory results come back in the same time frame and the provider may be waiting for multiple results in order to interpret others.  Please give us 48 hours in order for your provider to thoroughly review all the results before contacting the office for clarification of your results.   +++++++++++++++++++++++++++++++  Vit D  & Vit C 1,000 mg   are recommended to help protect  against the Covid-19 and other Corona viruses.    Also it's recommended  to take  Zinc 50 mg  to help  protect against the Covid-19   and best place to get  is also on [employer]  and don't pay more than 6-8 cents /pill !  ================================ Coronavirus (COVID-19) Are you at risk?  Are you at risk for the Coronavirus (COVID-19)?  To be considered HIGH RISK for Coronavirus (COVID-19), you have to meet the following criteria:  Traveled to [place], [place], [place], [place] or [place]; or in the [place] to [place], [place], [place]  or [place]; and have fever, cough, and shortness of breath within the last 2 weeks of travel OR Been in close contact with a person diagnosed with COVID-19 within the last 2 weeks and have  fever, cough,and shortness of breath  IF YOU DO NOT MEET THESE CRITERIA, YOU ARE CONSIDERED LOW RISK FOR COVID-19.  What to do if you are HIGH RISK for COVID-19?  If you are having a medical emergency, call 911. Seek medical care right away. Before you go to a doctor's office, urgent care or emergency department,  call ahead and tell them about your recent travel, contact with someone diagnosed with COVID-19   and your symptoms.  You should receive instructions from your physician's office regarding  next steps of care.  When you arrive at healthcare provider, tell the healthcare staff immediately you have returned from  visiting [place], [place], [place], [place] or [place]; or traveled in the [place] to [place], [place],  [place] or [place] in the last two weeks or you have been in close contact with a person diagnosed with  COVID-19 in the last 2 weeks.   Tell the health care staff about your symptoms: fever, cough and shortness of breath. After you have been seen by a medical provider, you will be either: Tested for (COVID-19) and discharged home on quarantine except to seek medical care if  symptoms worsen, and asked to  Stay home and avoid contact with others until you get your results (4-5 days)  Avoid travel on public transportation if possible (such as bus, train, or airplane) or Sent to the Emergency Department by EMS for evaluation, COVID-19 testing  and  possible admission depending on your condition and test results.  What to do if you are LOW RISK for COVID-19?  Reduce your risk of any infection by using the same precautions used for avoiding the common cold or flu:  Wash your hands often with soap and warm water for at least 20 seconds.  If soap and water are not readily available,  use an alcohol-based hand sanitizer with at least 60% alcohol.  If coughing or sneezing, cover your mouth and nose by coughing   or sneezing into the elbow areas of your shirt or coat,  into a tissue or into your sleeve (not your hands). Avoid shaking hands with others and consider head nods or verbal greetings only. Avoid touching your eyes, nose, or mouth with unwashed hands.  Avoid close contact with people who are sick. Avoid places or events with large numbers of people in one location, like concerts or sporting events. Carefully consider travel plans you have or are making. If you are planning any travel outside or inside the [place], visit the CDC's Travelers' Health webpage for  the latest health notices. If you have some symptoms but not all symptoms, continue to monitor at home and seek medical attention  if your symptoms worsen. If you are having a medical emergency, call 911. >>>>>>>>>>>>>>>>>>>>>>>>>>>>>>>>>>>>>>>>>>>>>>>>>>> We Do NOT Approve of [employer] > > > > > > > > > > > > > > > > > > > > > > > > > > > > > > > > > > >  > >    Preventive Care for Adults  A healthy lifestyle and preventive care can promote health and wellness. Preventive health guidelines for men include the following key practices: A routine yearly physical is a good way to check with your health care provider about your health and preventative screening. It is a chance to share any concerns and updates on your health and to receive a thorough exam. Visit your dentist for a routine exam and preventative care every 6 months. Brush your teeth twice a day and floss once a day. Good oral hygiene prevents tooth decay and gum disease. The frequency of eye exams is based on your age, health, family medical history, use of contact lenses, and other factors. Follow your health care provider's recommendations for frequency of eye exams. Eat a healthy diet. Foods such as vegetables, fruits, whole grains, low-fat dairy products, and lean protein foods contain the nutrients you need without too many calories. Decrease your intake of foods high in solid fats, added sugars, and salt. Eat the right amount of calories for you. Get information about a proper diet from your health care provider, if necessary. Regular physical exercise is one of the most important things you can do for your health. Most adults should get at least 150 minutes of moderate-intensity exercise (any activity that increases your heart rate and causes you to sweat) each week. In addition, most adults need muscle-strengthening exercises on 2 or more days a week. Maintain a healthy weight. The body mass index (BMI) is a screening  tool to identify possible weight problems. It provides an estimate of body fat based on height and weight. Your health care provider can find your BMI and can help you achieve or maintain a healthy weight. For adults 20 years and older: A BMI below 18.5 is considered underweight. A BMI of 18.5 to 24.9 is normal. A BMI of 25 to 29.9 is considered overweight. A BMI of 30 and above is considered obese. Maintain normal blood lipids and cholesterol levels by exercising and minimizing your intake of saturated fat. Eat a balanced diet with plenty of fruit and vegetables. Blood tests for lipids and cholesterol should begin at age 20 and be repeated every 5 years. If your lipid or cholesterol levels are high, you are over 50, or you are at high risk for heart disease, you may need your cholesterol levels checked more frequently. Ongoing high lipid and cholesterol levels should be   treated with medicines if diet and exercise are not working. If you smoke, find out from your health care provider how to quit. If you do not use tobacco, do not start. Lung cancer screening is recommended for adults aged 55-80 years who are at high risk for developing lung cancer because of a history of smoking. A yearly low-dose CT scan of the lungs is recommended for people who have at least a 30-pack-year history of smoking and are a current smoker or have quit within the past 15 years. A pack year of smoking is smoking an average of 1 pack of cigarettes a day for 1 year (for example: 1 pack a day for 30 years or 2 packs a day for 15 years). Yearly screening should continue until the smoker has stopped smoking for at least 15 years. Yearly screening should be stopped for people who develop a health problem that would prevent them from having lung cancer treatment. If you choose to drink alcohol, do not have more than 2 drinks per day. One drink is considered to be 12 ounces (355 mL) of beer, 5 ounces (148 mL) of wine, or 1.5 ounces (44  mL) of liquor. Avoid use of street drugs. Do not share needles with anyone. Ask for help if you need support or instructions about stopping the use of drugs. High blood pressure causes heart disease and increases the risk of stroke. Your blood pressure should be checked at least every 1-2 years. Ongoing high blood pressure should be treated with medicines, if weight loss and exercise are not effective. If you are 45-79 years old, ask your health care provider if you should take aspirin to prevent heart disease. Diabetes screening involves taking a blood sample to check your fasting blood sugar level. Testing should be considered at a younger age or be carried out more frequently if you are overweight and have at least 1 risk factor for diabetes. Colorectal cancer can be detected and often prevented. Most routine colorectal cancer screening begins at the age of 50 and continues through age 75. However, your health care provider may recommend screening at an earlier age if you have risk factors for colon cancer. On a yearly basis, your health care provider may provide home test kits to check for hidden blood in the stool. Use of a small camera at the end of a tube to directly examine the colon (sigmoidoscopy or colonoscopy) can detect the earliest forms of colorectal cancer. Talk to your health care provider about this at age 50, when routine screening begins. Direct exam of the colon should be repeated every 5-10 years through age 75, unless early forms of precancerous polyps or small growths are found. Hepatitis C blood testing is recommended for all people born from 1945 through 1965 and any individual with known risks for hepatitis C. Screening for abdominal aortic aneurysm (AAA)  by ultrasound is recommended for people who have history of high blood pressure or who are current or former smokers. Healthy men should  receive prostate-specific antigen (PSA) blood tests as part of routine cancer screening.  Talk with your health care provider about prostate cancer screening. Testicular cancer screening is  recommended for adult males. Screening includes self-exam, a health care provider exam, and other screening tests. Consult with your health care provider about any symptoms you have or any concerns you have about testicular cancer. Use sunscreen. Apply sunscreen liberally and repeatedly throughout the day. You should seek shade when your shadow is shorter than   you. Protect yourself by wearing long sleeves, pants, a wide-brimmed hat, and sunglasses year round, whenever you are outdoors. Once a month, do a whole-body skin exam, using a mirror to look at the skin on your back. Tell your health care provider about new moles, moles that have irregular borders, moles that are larger than a pencil eraser, or moles that have changed in shape or color. Stay current with required vaccines (immunizations). Influenza vaccine. All adults should be immunized every year. Tetanus, diphtheria, and acellular pertussis (Td, Tdap) vaccine. An adult who has not previously received Tdap or who does not know his vaccine status should receive 1 dose of Tdap. This initial dose should be followed by tetanus and diphtheria toxoids (Td) booster doses every 10 years. Adults with an unknown or incomplete history of completing a 3-dose immunization series with Td-containing vaccines should begin or complete a primary immunization series including a Tdap dose. Adults should receive a Td booster every 10 years. Zoster vaccine. One dose is recommended for adults aged 60 years or older unless certain conditions are present.  PREVNAR - Pneumococcal 13-valent conjugate (PCV13) vaccine. When indicated, a person who is uncertain of his immunization history and has no record of immunization should receive the PCV13 vaccine. An adult aged 19 years or older who has certain medical conditions and has not been previously immunized should receive 1  dose of PCV13 vaccine. This PCV13 should be followed with a dose of pneumococcal polysaccharide (PPSV23) vaccine. The PPSV23 vaccine dose should be obtained 1 or more year(s)after the dose of PCV13 vaccine. An adult aged 19 years or older who has certain medical conditions and previously received 1 or more doses of PPSV23 vaccine should receive 1 dose of PCV13. The PCV13 vaccine dose should be obtained 1 or more years after the last PPSV23 vaccine dose.  PNEUMOVAX - Pneumococcal polysaccharide (PPSV23) vaccine. When PCV13 is also indicated, PCV13 should be obtained first. All adults aged 65 years and older should be immunized. An adult younger than age 65 years who has certain medical conditions should be immunized. Any person who resides in a nursing home or long-term care facility should be immunized. An adult smoker should be immunized. People with an immunocompromised condition and certain other conditions should receive both PCV13 and PPSV23 vaccines. People with human immunodeficiency virus (HIV) infection should be immunized as soon as possible after diagnosis. Immunization during chemotherapy or radiation therapy should be avoided. Routine use of PPSV23 vaccine is not recommended for American Indians, [place] Natives, or people younger than 65 years unless there are medical conditions that require PPSV23 vaccine. When indicated, people who have unknown immunization and have no record of immunization should receive PPSV23 vaccine. One-time revaccination 5 years after the first dose of PPSV23 is recommended for people aged 19-64 years who have chronic kidney failure, nephrotic syndrome, asplenia, or immunocompromised conditions. People who received 1-2 doses of PPSV23 before age 65 years should receive another dose of PPSV23 vaccine at age 65 years or later if at least 5 years have passed since the previous dose. Doses of PPSV23 are not needed for people immunized with PPSV23 at or after age 65  years.  Hepatitis A vaccine. Adults who wish to be protected from this disease, have certain high-risk conditions, work with hepatitis A-infected animals, work in hepatitis A research labs, or travel to or work in countries with a high rate of hepatitis A should be immunized. Adults who were previously unvaccinated and who anticipate close contact   with an international adoptee during the first 60 days after arrival in the [place] from a country with a high rate of hepatitis A should be immunized.  Hepatitis B vaccine. Adults should be immunized if they wish to be protected from this disease, have certain high-risk conditions, may be exposed to blood or other infectious body fluids, are household contacts or sex partners of hepatitis B positive people, are clients or workers in certain care facilities, or travel to or work in countries with a high rate of hepatitis B.  Preventive Service / Frequency  Ages 65 and over Blood pressure check. Lipid and cholesterol check. Lung cancer screening. / Every year if you are aged 55-80 years and have a 30-pack-year history of smoking and currently smoke or have quit within the past 15 years. Yearly screening is stopped once you have quit smoking for at least 15 years or develop a health problem that would prevent you from having lung cancer treatment. Fecal occult blood test (FOBT) of stool. You may not have to do this test if you get a colonoscopy every 10 years. Flexible sigmoidoscopy** or colonoscopy.** / Every 5 years for a flexible sigmoidoscopy or every 10 years for a colonoscopy beginning at age 50 and continuing until age 75. Hepatitis C blood test.** / For all people born from 1945 through 1965 and any individual with known risks for hepatitis C. Abdominal aortic aneurysm (AAA) screening./ Screening current or former smokers or have Hypertension. Skin self-exam. / Monthly. Influenza vaccine. / Every year. Tetanus, diphtheria, and acellular  pertussis (Tdap/Td) vaccine.** / 1 dose of Td every 10 years.  Zoster vaccine.** / 1 dose for adults aged 60 years or older.         Pneumococcal 13-valent conjugate (PCV13) vaccine.   Pneumococcal polysaccharide (PPSV23) vaccine.   Hepatitis A vaccine.** / Consult your health care provider. Hepatitis B vaccine.** / Consult your health care provider. Screening for abdominal aortic aneurysm (AAA)  by ultrasound is recommended for people who have history of high blood pressure or who are current or former smokers. ++++++++++ Recommend Adult Low Dose Aspirin or  coated  Aspirin 81 mg daily  To reduce risk of Colon Cancer 40 %,  Skin Cancer 26 % ,  Malignant Melanoma 46%  and  Pancreatic cancer 60% ++++++++++++++++++++++ Vitamin D goal  is between 70-100.  Please make sure that you are taking your Vitamin D as directed.  It is very important as a natural anti-inflammatory  helping hair, skin, and nails, as well as reducing stroke and heart attack risk.  It helps your bones and helps with mood. It also decreases numerous cancer risks so please take it as directed.  Low Vit D is associated with a 200-300% higher risk for CANCER  and 200-300% higher risk for HEART   ATTACK  &  STROKE.   ...................................... It is also associated with higher death rate at younger ages,  autoimmune diseases like Rheumatoid arthritis, Lupus, Multiple Sclerosis.    Also many other serious conditions, like depression, Alzheimer's Dementia, infertility, muscle aches, fatigue, fibromyalgia - just to name a few. ++++++++++++++++++++++ Recommend the book "The END of DIETING" by Dr Joel Fuhrman  & the book "The END of DIABETES " by Dr Joel Fuhrman At [employer] - get book & Audio CD's    Being diabetic has a  300% increased risk for heart attack, stroke, cancer, and alzheimer- type vascular dementia. It is very important that you work harder with diet by   avoiding all foods that are white. Avoid  white rice (brown & wild rice is OK), white potatoes (sweetpotatoes in moderation is OK), White bread or wheat bread or anything made out of white flour like bagels, donuts, rolls, buns, biscuits, cakes, pastries, cookies, pizza crust, and pasta (made from white flour & egg whites) - vegetarian pasta or spinach or wheat pasta is OK. Multigrain breads like Arnold's or Pepperidge [employer], or multigrain sandwich thins or flatbreads.  Diet, exercise and weight loss can reverse and cure diabetes in the early stages.  Diet, exercise and weight loss is very important in the control and prevention of complications of diabetes which affects every system in your body, ie. Brain - dementia/stroke, eyes - glaucoma/blindness, heart - heart attack/heart failure, kidneys - dialysis, stomach - gastric paralysis, intestines - malabsorption, nerves - severe painful neuritis, circulation - gangrene & loss of a leg(s), and finally cancer and Alzheimers.    I recommend avoid fried & greasy foods,  sweets/candy, white rice (brown or wild rice or Quinoa is OK), white potatoes (sweet potatoes are OK) - anything made from white flour - bagels, doughnuts, rolls, buns, biscuits,white and wheat breads, pizza crust and traditional pasta made of white flour & egg white(vegetarian pasta or spinach or wheat pasta is OK).  Multi-grain bread is OK - like multi-grain flat bread or sandwich thins. Avoid alcohol in excess. Exercise is also important.    Eat all the vegetables you want - avoid meat, especially red meat and dairy - especially cheese.  Cheese is the most concentrated form of trans-fats which is the worst thing to clog up our arteries. Veggie cheese is OK which can be found in the fresh produce section at [employer] or [employer] or [employer]  ++++++++++++++++++++++ DASH Eating Plan  DASH stands for "Dietary Approaches to Stop Hypertension."   The DASH eating plan is a healthy eating plan that has been shown to reduce high  blood pressure (hypertension). Additional health benefits may include reducing the risk of type 2 diabetes mellitus, heart disease, and stroke. The DASH eating plan may also help with weight loss. WHAT DO I NEED TO KNOW ABOUT THE DASH EATING PLAN? For the DASH eating plan, you will follow these general guidelines: Choose foods with a percent daily value for sodium of less than 5% (as listed on the food label). Use salt-free seasonings or herbs instead of table salt or sea salt. Check with your health care provider or pharmacist before using salt substitutes. Eat lower-sodium products, often labeled as "lower sodium" or "no salt added." Eat fresh foods. Eat more vegetables, fruits, and low-fat dairy products. Choose whole grains. Look for the word "whole" as the first word in the ingredient list. Choose fish  Limit sweets, desserts, sugars, and sugary drinks. Choose heart-healthy fats. Eat veggie cheese  Eat more home-cooked food and less restaurant, buffet, and fast food. Limit fried foods. Cook foods using methods other than frying. Limit canned vegetables. If you do use them, rinse them well to decrease the sodium. When eating at a restaurant, ask that your food be prepared with less salt, or no salt if possible.                      WHAT FOODS CAN I EAT? Read Dr Joel Fuhrman's books on The End of Dieting & The End of Diabetes  Grains Whole grain or whole wheat bread. Brown rice. Whole grain or whole wheat pasta. Quinoa, bulgur, and   whole grain cereals. Low-sodium cereals. Corn or whole wheat flour tortillas. Whole grain cornbread. Whole grain crackers. Low-sodium crackers.  Vegetables Fresh or frozen vegetables (raw, steamed, roasted, or grilled). Low-sodium or reduced-sodium tomato and vegetable juices. Low-sodium or reduced-sodium tomato sauce and paste. Low-sodium or reduced-sodium canned vegetables.   Fruits All fresh, canned (in natural juice), or frozen fruits.  Protein  Products  All fish and seafood.  Dried beans, peas, or lentils. Unsalted nuts and seeds. Unsalted canned beans.  Dairy Low-fat dairy products, such as skim or 1% milk, 2% or reduced-fat cheeses, low-fat ricotta or cottage cheese, or plain low-fat yogurt. Low-sodium or reduced-sodium cheeses.  Fats and Oils Tub margarines without trans fats. Light or reduced-fat mayonnaise and salad dressings (reduced sodium). Avocado. Safflower, olive, or canola oils. Natural peanut or almond butter.  Other Unsalted popcorn and pretzels. The items listed above may not be a complete list of recommended foods or beverages. Contact your dietitian for more options.  ++++++++++++++++++++  WHAT FOODS ARE NOT RECOMMENDED? Grains/ White flour or wheat flour White bread. White pasta. White rice. Refined cornbread. Bagels and croissants. Crackers that contain trans fat.  Vegetables  Creamed or fried vegetables. Vegetables in a . Regular canned vegetables. Regular canned tomato sauce and paste. Regular tomato and vegetable juices.  Fruits Dried fruits. Canned fruit in light or heavy syrup. Fruit juice.  Meat and Other Protein Products Meat in general - RED meat & White meat.  Fatty cuts of meat. Ribs, chicken wings, all processed meats as bacon, sausage, bologna, salami, fatback, hot dogs, bratwurst and packaged luncheon meats.  Dairy Whole or 2% milk, cream, half-and-half, and cream cheese. Whole-fat or sweetened yogurt. Full-fat cheeses or blue cheese. Non-dairy creamers and whipped toppings. Processed cheese, cheese spreads, or cheese curds.  Condiments Onion and garlic salt, seasoned salt, table salt, and sea salt. Canned and packaged gravies. Worcestershire sauce. Tartar sauce. Barbecue sauce. Teriyaki sauce. Soy sauce, including reduced sodium. Steak sauce. Fish sauce. Oyster sauce. Cocktail sauce. Horseradish. Ketchup and mustard. Meat flavorings and tenderizers. Bouillon cubes. Hot sauce. Tabasco sauce.  Marinades. Taco seasonings. Relishes.  Fats and Oils Butter, stick margarine, lard, shortening and bacon fat. Coconut, palm kernel, or palm oils. Regular salad dressings.  Pickles and olives. Salted popcorn and pretzels.  The items listed above may not be a complete list of foods and beverages to avoid.   

## 2022-09-11 NOTE — Progress Notes (Unsigned)
Annual  Screening/Preventative Visit  & Comprehensive Evaluation & Examination   Future Appointments  Date Time Provider Department  09/12/2022                cpe 11:00 AM [name], MD [facility]  09/27/2022  2:15 PM Narda Amber K, DO [facility]  10/05/2022  1:30 PM [facility] PA/NP [facility]  11/02/2022  3:30 PM Marzetta Board, DPM [facility]  11/23/2022 10:30 AM Alda Berthold, DO [facility]  01/03/2023  3:30 PM Debbe Mounts, PhD [facility]  01/04/2023 12:00 PM Wyatt Portela, MD [facility]  05/22/2023                    wellness  2:00 PM Alycia Rossetti, NP [facility]  09/18/2023                 cpe 11:00 AM [name], MD [facility]          This very nice 81 y.o. [name]  with HTN, ASCVD/HxCVA, HLD, T2_NIDDM  and Vitamin D Deficiency  presents for a Screening /Preventative Visit & comprehensive evaluation and management of multiple medical co-morbidities.   Abdominal CT scan in 2016 found Aortic Atherosclerosis. 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 Patient is accompanied today by his son - [name] from [place].        HTN predates circa  1980. Patient's BP has been controlled at home.  Today's BP is at goal -  . 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  He has persistent Rt HP and ataxic gait.   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 (Non ischemic) . His cardiologists are Dr Aundra Dubin & Dr Caryl Comes. Patient denies any cardiac symptoms as chest pain, palpitations, shortness of breath, dizziness , but does admit moderate swelling of legs despite doubling Lasix dose 40 ->80 mg 1 x /day.       Patient's hyperlipidemia is controlled with diet and Atorvastatin.  Patient denies myalgias or other medication SE's. 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 Last A1c was near goal:   Lab Results  Component Value Date   HGBA1C 6.0 (H) 09/10/2021         Finally, patient has history of Vitamin D Deficiency ("30" /2008) and last vitamin D was at goal:   Lab Results  Component Value Date   VD25OH 82 09/10/2021       Current Outpatient Medications  Medication Instructions   VITAMIN C 500 mg, Oral, Daily,     atorvastatin  40 MG tablet TAKE 1 TABLET  DAILY    B-12      50 mcg Daily   VITAMIN D  1 tablet, Oral, Daily   FARXIGA 10 mg, Oral, Daily before breakfast   ELIQUIS 5 MG TABS tablet TAKE 1 TABLET TWICE DAILY    ferrous sulfate  325 mg  Daily at bedtime   finasteride 5 MG tablet TAKE 1 TABLET DAILY    furosemide  80 mg -AM  &  40 mg -PM   gabapentin 600 mg Daily at bedtime   glipiZIDE 5 MG tablet TAKE 1 TABLET TWICE DAILY    Levothyroxine 50 MCG tablet Take  1 tablet  Daily     Magnesium 250 mg, Oral, Daily   metFORMIN -XR 500 MG  TAKE 2 TABLETS  TWICE  DAILY    metoprolol succ -XL 50 MG 24 hr tablet TAKE 1 TABLET  AT  BEDTIME   Multiple Vitamin  1 capsule  Daily   ENTRESTO 24-26 MG 1 tablet  2 times daily   spironolactone 25 mg Daily at bedtime   Tafamidis (VYNDAMAX) 61 MG CAPS 1 capsule  Daily   triamcinolone cream 0.1 % As needed   ASPERCREME) 10 % cream Daily PRN     Allergies  Allergen Reactions   Penicillins Other (See Comments)     Health Maintenance  Topic Date Due   COVID-19 Vacc (4-Booster for Pfizer series) 04/21/2021   INFLUENZA VACCINE  06/21/2021   FOOT EXAM  07/12/2021   Zoster Vaccines- Shingrix (2 of 2) 07/22/2021   OPHTHALMOLOGY EXAM  10/08/2021   HEMOGLOBIN A1C  11/16/2021   TETANUS/TDAP  02/08/2025   Pneumonia Vaccine - 23  Completed   HPV VACCINES  Aged Out    Immunization History   Administered Date(s) Administered   DT  02/09/2015   Influenza Split 08/07/2013   Influenza, High Dose  08/15/2018, 09/06/2019, 10/19/2020   PFIZER SARS-COV-2 Vacc 12/11/2019, 01/01/2020, 02/24/2021   Pneumococcal 13 06/10/2016   Pneumococcal -23 08/13/2008   Td 11/22/2003   Zoster Recombinat (Shingrix) 05/27/2021    Last Colon - 05/01/2019 - Dr Havery Moros - recc 3-5 yr , but deferred to "age out"    Past Surgical History:  Procedure Laterality Date   CIRCUMCISION N/A 06/11/2019   Procedure: CIRCUMCISION ADULT;  Surgeon: [name], MD;  Location: [facility];  Service: Urology;  Laterality: N/A;   COLONOSCOPY     INGUINAL HERNIA REPAIR Bilateral    1968 and 2004   LOOP RECORDER INSERTION N/A 07/28/2017   Procedure: LOOP RECORDER INSERTION;  Surgeon: Deboraha Sprang, MD;  Location: [place] [facility];  Service: Cardiovascular;  Laterality: N/A;   TEE WITHOUT CARDIOVERSION N/A 07/28/2017   Procedure: TRANSESOPHAGEAL ECHOCARDIOGRAM (TEE);  Surgeon: Sueanne Margarita, MD;  Location: [facility];  Service: Cardiovascular;  Laterality: N/A;     Family History  Problem Relation Age of Onset   Sickle cell anemia Mother    Diabetes Father    Stomach cancer Father    Colon cancer Father    CAD Neg Hx    Esophageal cancer Neg Hx    Rectal cancer Neg Hx      Social History   Tobacco Use   Smoking status: Never   Smokeless tobacco: Never  Vaping Use   Vaping Use: Never used  Substance Use Topics   Alcohol use: No   Drug use: No      ROS Constitutional: Denies fever, chills, weight loss/gain, headaches, insomnia,  night sweats or change in appetite. Does c/o fatigue. Eyes: Denies redness, blurred vision, diplopia, discharge, itchy or watery eyes.  ENT: Denies discharge, congestion, post nasal drip, epistaxis, sore throat, earache, hearing loss, dental pain, Tinnitus, Vertigo, Sinus pain or snoring.  Cardio: Denies chest pain, palpitations, irregular heartbeat, syncope,  dyspnea, diaphoresis, orthopnea, PND, claudication or edema Respiratory: denies cough, dyspnea, DOE, pleurisy, hoarseness, laryngitis or wheezing.  Gastrointestinal: Denies dysphagia, heartburn, reflux, water brash, pain, cramps, nausea,  vomiting, bloating, diarrhea, constipation, hematemesis, melena, hematochezia, jaundice or hemorrhoids Genitourinary: Denies dysuria, frequency, urgency, nocturia, hesitancy, discharge, hematuria or flank pain Musculoskeletal: Denies arthralgia, myalgia, stiffness, Jt. Swelling, pain, limp or strain/sprain. Denies Falls. Skin: Denies puritis, rash, hives, warts, acne, eczema or change in skin lesion Neuro: No weakness, tremor, incoordination, spasms, paresthesia or pain Psychiatric: Denies confusion, memory loss or sensory loss. Denies Depression. Endocrine: Denies change in weight, skin, hair change, nocturia, and paresthesia, diabetic polys, visual blurring or hyper / hypo glycemic episodes.  Heme/Lymph: No excessive bleeding, bruising or enlarged lymph nodes.   Physical Exam  There were no vitals taken for this visit.  General Appearance: Well nourished and well groomed and in no apparent distress.  Eyes: PERRLA, EOMs, conjunctiva no swelling or erythema, normal fundi and vessels. Sinuses: No frontal/maxillary tenderness ENT/Mouth: EACs patent / TMs  nl. Nares clear without erythema, swelling, mucoid exudates. Oral hygiene is good. No erythema, swelling, or exudate. Tongue normal, non-obstructing. Tonsils not swollen or erythematous. Hearing normal.  Neck: Supple, thyroid not palpable. No bruits, nodes or JVD. Respiratory: Respiratory effort normal.  BS equal and clear bilateral without rales, rhonci, wheezing or stridor. Cardio: Heart sounds are normal with regular rate and rhythm and no murmurs, rubs or gallops. Pedal pulses are obscured 2+ pitting edema to the knees & 3-4 + ankle edema. No aortic or femoral bruits. Chest: symmetric with normal  excursions and percussion.  Abdomen: Soft, with Nl bowel sounds. Nontender, no guarding, rebound, hernias, masses, or organomegaly.  Lymphatics: Non tender without lymphadenopathy.  Musculoskeletal: Mild spastic Rt HP w/shuffling /limping gait using a cane for support.  Skin: Warm and dry without rashes, lesions, cyanosis, clubbing or  ecchymosis.  Neuro: Cranial nerves intact, reflexes equal bilaterally. Mild Rt spastic HP. Sensation to touch & Vibratory  decreased below mid shin(s) .  Pysch: Alert and oriented X 3 with normal affect, insight and judgment appropriate.   Assessment and Plan  1. Annual Preventative/Screening Exam    2. Essential hypertension  - EKG 12-Lead - [place], RETROPERITNL ABD,  LTD - Urinalysis, Routine w reflex microscopic - Microalbumin / creatinine urine ratio - CBC with Differential/Platelet - COMPLETE METABOLIC PANEL WITH GFR - Magnesium - TSH  3. Hyperlipidemia associated with type 2 diabetes mellitus ([place])  - EKG 12-Lead - [place], RETROPERITNL ABD,  LTD - Lipid panel - TSH  4. Type 2 diabetes mellitus with stage 2 chronic kidney  disease, without long-term current use of insulin (HCC)  - EKG 12-Lead - [place], RETROPERITNL ABD,  LTD - Urinalysis, Routine w reflex microscopic - Microalbumin / creatinine urine ratio - COMPLETE METABOLIC PANEL WITH GFR - Hemoglobin A1c - Insulin, random  5. Vitamin D deficiency  - VITAMIN D 25 Hydroxy   6. Hypothyroidism  - TSH  7. Paroxysmal atrial fibrillation (HCC)  - EKG 12-Lead - TSH  8. Chronic systolic heart failure (HCC)  - EKG 12-Lead - Lipid panel  9. MGUS (monoclonal gammopathy of unknown significance)  - CBC with Differential/Platelet - COMPLETE METABOLIC PANEL WITH GFR  10. History of CVA with residual deficit  - Lipid panel  11. Abdominal aortic atherosclerosis ([place]) by Abd CTscvn  2016  - EKG 12-Lead - [place], RETROPERITNL ABD,  LTD - Lipid panel  12. 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 Screening for  colorectal cancer  - POC Hemoccult Bld/Stl   15. Prostate cancer screening  - PSA  16. FHx: heart disease  - EKG 12-Lead - [place], RETROPERITNL ABD,  LTD  17. Screening for AAA (aortic abdominal aneurysm)  - [place], RETROPERITNL ABD,  LTD  18. Chronic anticoagulation  - Urinalysis, Routine w reflex microscopic - Microalbumin / creatinine urine ratio - CBC with Differential/Platelet - COMPLETE METABOLIC PANEL WITH GFR - Magnesium - Lipid panel - TSH - Hemoglobin A1c - Insulin, random - VITAMIN D 25 Hydroxy         Patient was counseled in prudent diet, weight control to achieve/maintain BMI less than 25, BP monitoring, regular exercise and medications as discussed.  Discussed med effects and SE's. Routine screening labs and tests as requested with regular follow-up as recommended. Over 40 minutes of exam, counseling, chart review and high complex critical decision making was performed   Kirtland Bouchard, MD

## 2022-09-12 ENCOUNTER — Ambulatory Visit (INDEPENDENT_AMBULATORY_CARE_PROVIDER_SITE_OTHER): Payer: Medicare Other | Admitting: Internal Medicine

## 2022-09-12 ENCOUNTER — Encounter: Payer: Self-pay | Admitting: Internal Medicine

## 2022-09-12 VITALS — BP 106/60 | HR 66 | Temp 97.9°F | Resp 16 | Ht 71.0 in | Wt 156.0 lb

## 2022-09-12 DIAGNOSIS — I700 Atherosclerosis of aorta: Secondary | ICD-10-CM | POA: Diagnosis not present

## 2022-09-12 DIAGNOSIS — N182 Chronic kidney disease, stage 2 (mild): Secondary | ICD-10-CM | POA: Diagnosis not present

## 2022-09-12 DIAGNOSIS — Z8249 Family history of ischemic heart disease and other diseases of the circulatory system: Secondary | ICD-10-CM | POA: Diagnosis not present

## 2022-09-12 DIAGNOSIS — Z23 Encounter for immunization: Secondary | ICD-10-CM

## 2022-09-12 DIAGNOSIS — I480 Paroxysmal atrial fibrillation: Secondary | ICD-10-CM | POA: Diagnosis not present

## 2022-09-12 DIAGNOSIS — Z136 Encounter for screening for cardiovascular disorders: Secondary | ICD-10-CM | POA: Diagnosis not present

## 2022-09-12 DIAGNOSIS — Z1211 Encounter for screening for malignant neoplasm of colon: Secondary | ICD-10-CM

## 2022-09-12 DIAGNOSIS — E1141 Type 2 diabetes mellitus with diabetic mononeuropathy: Secondary | ICD-10-CM | POA: Diagnosis not present

## 2022-09-12 DIAGNOSIS — Z7901 Long term (current) use of anticoagulants: Secondary | ICD-10-CM

## 2022-09-12 DIAGNOSIS — E1169 Type 2 diabetes mellitus with other specified complication: Secondary | ICD-10-CM

## 2022-09-12 DIAGNOSIS — I10 Essential (primary) hypertension: Secondary | ICD-10-CM

## 2022-09-12 DIAGNOSIS — Z0000 Encounter for general adult medical examination without abnormal findings: Secondary | ICD-10-CM | POA: Diagnosis not present

## 2022-09-12 DIAGNOSIS — D472 Monoclonal gammopathy: Secondary | ICD-10-CM

## 2022-09-12 DIAGNOSIS — E8582 Wild-type transthyretin-related (ATTR) amyloidosis: Secondary | ICD-10-CM | POA: Diagnosis not present

## 2022-09-12 DIAGNOSIS — E1122 Type 2 diabetes mellitus with diabetic chronic kidney disease: Secondary | ICD-10-CM | POA: Diagnosis not present

## 2022-09-12 DIAGNOSIS — E559 Vitamin D deficiency, unspecified: Secondary | ICD-10-CM

## 2022-09-12 DIAGNOSIS — Z0001 Encounter for general adult medical examination with abnormal findings: Secondary | ICD-10-CM

## 2022-09-12 DIAGNOSIS — I130 Hypertensive heart and chronic kidney disease with heart failure and stage 1 through stage 4 chronic kidney disease, or unspecified chronic kidney disease: Secondary | ICD-10-CM | POA: Diagnosis not present

## 2022-09-12 DIAGNOSIS — I509 Heart failure, unspecified: Secondary | ICD-10-CM | POA: Diagnosis not present

## 2022-09-12 DIAGNOSIS — I6930 Unspecified sequelae of cerebral infarction: Secondary | ICD-10-CM

## 2022-09-12 DIAGNOSIS — E039 Hypothyroidism, unspecified: Secondary | ICD-10-CM

## 2022-09-12 DIAGNOSIS — I5022 Chronic systolic (congestive) heart failure: Secondary | ICD-10-CM

## 2022-09-12 DIAGNOSIS — Z125 Encounter for screening for malignant neoplasm of prostate: Secondary | ICD-10-CM

## 2022-09-12 DIAGNOSIS — N138 Other obstructive and reflux uropathy: Secondary | ICD-10-CM

## 2022-09-13 LAB — CBC WITH DIFFERENTIAL/PLATELET
Absolute Monocytes: 431 cells/uL (ref 200–950)
Basophils Absolute: 49 cells/uL (ref 0–200)
Basophils Relative: 1 %
Eosinophils Absolute: 162 cells/uL (ref 15–500)
Eosinophils Relative: 3.3 %
HCT: 38.7 % (ref 38.5–50.0)
Hemoglobin: 13.4 g/dL (ref 13.2–17.1)
Lymphs Abs: 1367 cells/uL (ref 850–3900)
MCH: 31 pg (ref 27.0–33.0)
MCHC: 34.6 g/dL (ref 32.0–36.0)
MCV: 89.6 fL (ref 80.0–100.0)
MPV: 10.1 fL (ref 7.5–12.5)
Monocytes Relative: 8.8 %
Neutro Abs: 2891 cells/uL (ref 1500–7800)
Neutrophils Relative %: 59 %
Platelets: 197 10*3/uL (ref 140–400)
RBC: 4.32 10*6/uL (ref 4.20–5.80)
RDW: 12.2 % (ref 11.0–15.0)
Total Lymphocyte: 27.9 %
WBC: 4.9 10*3/uL (ref 3.8–10.8)

## 2022-09-13 LAB — COMPLETE METABOLIC PANEL WITH GFR
AG Ratio: 2 (calc) (ref 1.0–2.5)
ALT: 21 U/L (ref 9–46)
AST: 19 U/L (ref 10–35)
Albumin: 4.2 g/dL (ref 3.6–5.1)
Alkaline phosphatase (APISO): 51 U/L (ref 35–144)
BUN: 25 mg/dL (ref 7–25)
CO2: 30 mmol/L (ref 20–32)
Calcium: 9.8 mg/dL (ref 8.6–10.3)
Chloride: 105 mmol/L (ref 98–110)
Creat: 1.08 mg/dL (ref 0.70–1.22)
Globulin: 2.1 g/dL (calc) (ref 1.9–3.7)
Glucose, Bld: 116 mg/dL — ABNORMAL HIGH (ref 65–99)
Potassium: 4.6 mmol/L (ref 3.5–5.3)
Sodium: 143 mmol/L (ref 135–146)
Total Bilirubin: 0.6 mg/dL (ref 0.2–1.2)
Total Protein: 6.3 g/dL (ref 6.1–8.1)
eGFR: 69 mL/min/{1.73_m2} (ref >=60)

## 2022-09-13 LAB — URINALYSIS, ROUTINE W REFLEX MICROSCOPIC
Bilirubin Urine: NEGATIVE
Hgb urine dipstick: NEGATIVE
Ketones, ur: NEGATIVE
Leukocytes,Ua: NEGATIVE
Nitrite: NEGATIVE
Protein, ur: NEGATIVE
Specific Gravity, Urine: 1.02 (ref 1.001–1.035)
pH: 7 (ref 5.0–8.0)

## 2022-09-13 LAB — MAGNESIUM: Magnesium: 2.2 mg/dL (ref 1.5–2.5)

## 2022-09-13 LAB — LIPID PANEL
Cholesterol: 119 mg/dL (ref <200)
HDL: 61 mg/dL (ref >=40)
LDL Cholesterol (Calc): 48 mg/dL (calc)
Non-HDL Cholesterol (Calc): 58 mg/dL (calc) (ref <130)
Total CHOL/HDL Ratio: 2 (calc) (ref <5.0)
Triglycerides: 34 mg/dL (ref <150)

## 2022-09-13 LAB — TSH: TSH: 0.83 mIU/L (ref 0.40–4.50)

## 2022-09-13 LAB — HEMOGLOBIN A1C
Hgb A1c MFr Bld: 6.3 % of total Hgb — ABNORMAL HIGH (ref <5.7)
Mean Plasma Glucose: 134 mg/dL
eAG (mmol/L): 7.4 mmol/L

## 2022-09-13 LAB — MICROALBUMIN / CREATININE URINE RATIO
Creatinine, Urine: 49 mg/dL (ref 20–320)
Microalb Creat Ratio: 6 mcg/mg creat (ref <30)
Microalb, Ur: 0.3 mg/dL

## 2022-09-13 LAB — PSA: PSA: 0.18 ng/mL (ref <=4.00)

## 2022-09-13 LAB — VITAMIN D 25 HYDROXY (VIT D DEFICIENCY, FRACTURES): Vit D, 25-Hydroxy: 66 ng/mL (ref 30–100)

## 2022-09-13 LAB — INSULIN, RANDOM: Insulin: 39.6 u[IU]/mL — ABNORMAL HIGH

## 2022-09-13 NOTE — Progress Notes (Signed)
<><><><><><><><><><><><><><><><><><><><><><><><><><><><><><><><><> <><><><><><><><><><><><><><><><><><><><><><><><><><><><><><><><><> -   Test results slightly outside the reference range are not unusual. If there is anything important, I will review this with you,  otherwise it is considered normal test values.  If you have further questions,  please do not hesitate to contact me at the office or via My Chart.  <><><><><><><><><><><><><><><><><><><><><><><><><><><><><><><><><> <><><><><><><><><><><><><><><><><><><><><><><><><><><><><><><><><>  -  A1c = 6.3% - Slightly elevated  - Goal is less than 6.0%  <><><><><><><><><><><><><><><><><><><><><><><><><><><><><><><><><>  - PSA - Low  - Great - No Prostate Cancer  <><><><><><><><><><><><><><><><><><><><><><><><><><><><><><><><><>  - Vitamin D = 66 - Excellent  -  Please keep dose same  <><><><><><><><><><><><><><><><><><><><><><><><><><><><><><><><><>  - Total Chol = 119    &   LDL Chol = 48    - Both  Excellent   - Very low risk for Heart Attack  / Stroke <><><><><><><><><><><><><><><><><><><><><><><><><><><><><><><><><>  - All Else - CBC - Kidneys - Electrolytes - Liver - Magnesium & Thyroid    - all  Normal / OK <><><><><><><><><><><><><><><><><><><><><><><><><><><><><><><><><> <><><><><><><><><><><><><><><><><><><><><><><><><><><><><><><><><>  - Keep up the [place] Work  !  <><><><><><><><><><><><><><><><><><><><><><><><><><><><><><><><><> <><><><><><><><><><><><><><><><><><><><><><><><><><><><><><><><><>

## 2022-09-14 ENCOUNTER — Encounter (HOSPITAL_COMMUNITY): Payer: Self-pay | Admitting: Pharmacy Technician

## 2022-09-16 ENCOUNTER — Other Ambulatory Visit (HOSPITAL_COMMUNITY): Payer: Self-pay

## 2022-09-19 ENCOUNTER — Other Ambulatory Visit (HOSPITAL_COMMUNITY): Payer: Self-pay

## 2022-09-19 DIAGNOSIS — I509 Heart failure, unspecified: Secondary | ICD-10-CM | POA: Diagnosis not present

## 2022-09-19 DIAGNOSIS — E1141 Type 2 diabetes mellitus with diabetic mononeuropathy: Secondary | ICD-10-CM | POA: Diagnosis not present

## 2022-09-19 DIAGNOSIS — I130 Hypertensive heart and chronic kidney disease with heart failure and stage 1 through stage 4 chronic kidney disease, or unspecified chronic kidney disease: Secondary | ICD-10-CM | POA: Diagnosis not present

## 2022-09-19 DIAGNOSIS — N182 Chronic kidney disease, stage 2 (mild): Secondary | ICD-10-CM | POA: Diagnosis not present

## 2022-09-19 DIAGNOSIS — E1122 Type 2 diabetes mellitus with diabetic chronic kidney disease: Secondary | ICD-10-CM | POA: Diagnosis not present

## 2022-09-19 DIAGNOSIS — E8582 Wild-type transthyretin-related (ATTR) amyloidosis: Secondary | ICD-10-CM | POA: Diagnosis not present

## 2022-09-21 ENCOUNTER — Other Ambulatory Visit (HOSPITAL_COMMUNITY): Payer: Self-pay

## 2022-09-22 ENCOUNTER — Other Ambulatory Visit (HOSPITAL_COMMUNITY): Payer: Self-pay

## 2022-09-26 DIAGNOSIS — E1141 Type 2 diabetes mellitus with diabetic mononeuropathy: Secondary | ICD-10-CM | POA: Diagnosis not present

## 2022-09-26 DIAGNOSIS — E8582 Wild-type transthyretin-related (ATTR) amyloidosis: Secondary | ICD-10-CM | POA: Diagnosis not present

## 2022-09-26 DIAGNOSIS — I509 Heart failure, unspecified: Secondary | ICD-10-CM | POA: Diagnosis not present

## 2022-09-26 DIAGNOSIS — N182 Chronic kidney disease, stage 2 (mild): Secondary | ICD-10-CM | POA: Diagnosis not present

## 2022-09-26 DIAGNOSIS — I130 Hypertensive heart and chronic kidney disease with heart failure and stage 1 through stage 4 chronic kidney disease, or unspecified chronic kidney disease: Secondary | ICD-10-CM | POA: Diagnosis not present

## 2022-09-26 DIAGNOSIS — E1122 Type 2 diabetes mellitus with diabetic chronic kidney disease: Secondary | ICD-10-CM | POA: Diagnosis not present

## 2022-09-27 ENCOUNTER — Ambulatory Visit: Payer: Medicare Other | Admitting: Neurology

## 2022-09-27 ENCOUNTER — Other Ambulatory Visit (HOSPITAL_COMMUNITY): Payer: Self-pay

## 2022-09-27 DIAGNOSIS — G6289 Other specified polyneuropathies: Secondary | ICD-10-CM

## 2022-09-27 DIAGNOSIS — E854 Organ-limited amyloidosis: Secondary | ICD-10-CM

## 2022-09-27 DIAGNOSIS — G63 Polyneuropathy in diseases classified elsewhere: Secondary | ICD-10-CM

## 2022-09-27 DIAGNOSIS — R258 Other abnormal involuntary movements: Secondary | ICD-10-CM

## 2022-09-27 DIAGNOSIS — I43 Cardiomyopathy in diseases classified elsewhere: Secondary | ICD-10-CM

## 2022-09-27 DIAGNOSIS — R292 Abnormal reflex: Secondary | ICD-10-CM

## 2022-09-27 DIAGNOSIS — Z8673 Personal history of transient ischemic attack (TIA), and cerebral infarction without residual deficits: Secondary | ICD-10-CM

## 2022-09-27 DIAGNOSIS — R2681 Unsteadiness on feet: Secondary | ICD-10-CM

## 2022-09-27 NOTE — Procedures (Signed)
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  NCV & EMG Findings: Extensive electrodiagnostic testing of the left upper and lower extremity shows:  Left median, sural, and superficial peroneal sensory responses are absent.  Left ulnar sensory response shows prolonged latency (3.8 ms).  Left radial sensory response is reduced (L5.9 V). Left median, peroneal (EDB), and tibial motor responses are within normal limits.  Left peroneal and ulnar motor responses show reduced amplitude (L2.2, L5.4 mV). In the left upper extremity, chronic motor axonal loss changes are seen affecting the distal hand muscles, with no motor unit recruitment in the abductor pollicis brevis muscle.  There is no evidence of active denervation. In the left lower extremity, chronic motor axonal loss changes are seen in all of the tested muscles, without accompanied active denervation.  Proximal and deep muscles were not tested as the patient is on anticoagulation therapy.  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 A superimposed severe left carpal tunnel syndrome is also present.   ___________________________ Narda Amber, DO    Nerve Conduction Studies   Stim Site NR Peak (ms) Norm Peak (ms) O-P Amp (V) Norm O-P Amp  Left Median Anti Sensory (2nd Digit)  34 C  Wrist *NR  <3.8  >10  Left Radial Anti Sensory (Base 1st Digit)  34 C  Wrist    2.3 <2.8 *5.9 >10  Left Sup Peroneal Anti Sensory (Ant Lat Mall)   34 C  12 cm *NR  <4.6  >3  Left Sural Anti Sensory (Lat Mall)  34 C  Calf *NR  <4.6  >3  Left Ulnar Anti Sensory (5th Digit)  34 C  Wrist    *3.8 <3.2 8.1 >5     Stim Site NR Onset (ms) Norm Onset (ms) O-P Amp (mV) Norm O-P Amp Site1 Site2 Delta-0 (ms) Dist (cm) Vel (m/s) Norm Vel (m/s)  Left Median Motor (Abd Poll Brev)  34 C  Wrist *NR  <4.0  >5 Elbow Wrist  0.0  >50  Elbow *NR            Left Peroneal Motor (Ext Dig Brev)  34 C  Ankle *NR  <6.0  >2.5 B Fib Ankle  0.0  >40  B Fib *NR     Poplt B Fib  0.0  >40  Poplt *NR            Left Peroneal TA Motor (Tib Ant)  34 C  Fib Head    2.9 <4.5 *2.2 >3 Poplit Fib Head 1.4 8.0 57 >40  Poplit    4.3 <5.7 2.1         Left Tibial Motor (Abd Hall Brev)  34 C  Ankle *NR  <6.0  >4 Knee Ankle  0.0  >40  Knee *NR            Left Ulnar Motor (Abd Dig Minimi)  34 C  Wrist    2.6 <3.1 *5.4 >7 B Elbow Wrist 4.5 23.0 51 >50  B Elbow    7.1  5.1  A Elbow B  Elbow 2.0 10.0 50 >50  A Elbow    9.1  5.0          Electromyography   Side Muscle Ins.Act Fibs Fasc Recrt Amp Dur Poly Activation Comment  Left 1stDorInt Nml Nml Nml *2- *1+ *1+ *1+ Nml N/A  Left Abd Poll Brev Nml Nml Nml *None *- *- *- Nml N/A  Left Biceps Nml Nml Nml Nml Nml Nml Nml Nml N/A  Left Triceps Nml Nml Nml Nml Nml Nml Nml Nml N/A  Left Deltoid Nml Nml Nml Nml Nml Nml Nml Nml N/A  Left AntTibialis Nml Nml Nml *2- *1+ *1+ *1+ Nml N/A  Left Gastroc Nml Nml Nml *3- *1+ *1+ *1+ Nml N/A  Left RectFemoris Nml Nml Nml *2- *1+ *1+ *1+ Nml N/A  Left Ext Indicis Nml Nml Nml *2- *1+ *1+ *1+ Nml N/A      Waveforms:

## 2022-09-28 ENCOUNTER — Other Ambulatory Visit (HOSPITAL_COMMUNITY): Payer: Self-pay

## 2022-09-28 ENCOUNTER — Telehealth: Payer: Self-pay | Admitting: Neurology

## 2022-09-28 NOTE — Telephone Encounter (Signed)
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

## 2022-09-30 NOTE — Telephone Encounter (Signed)
Advanced Heart Failure Patient Advocate Encounter   Patient was approved to receive Amvuttra from [place].   Effective dates: 09/29/22 through 11/20/22  Audry Riles, PharmD, BCPS, BCCP, CPP Heart Failure Clinic Pharmacist [phone]

## 2022-10-03 DIAGNOSIS — I509 Heart failure, unspecified: Secondary | ICD-10-CM | POA: Diagnosis not present

## 2022-10-03 DIAGNOSIS — E8582 Wild-type transthyretin-related (ATTR) amyloidosis: Secondary | ICD-10-CM | POA: Diagnosis not present

## 2022-10-03 DIAGNOSIS — N182 Chronic kidney disease, stage 2 (mild): Secondary | ICD-10-CM | POA: Diagnosis not present

## 2022-10-03 DIAGNOSIS — E1141 Type 2 diabetes mellitus with diabetic mononeuropathy: Secondary | ICD-10-CM | POA: Diagnosis not present

## 2022-10-03 DIAGNOSIS — E1122 Type 2 diabetes mellitus with diabetic chronic kidney disease: Secondary | ICD-10-CM | POA: Diagnosis not present

## 2022-10-03 DIAGNOSIS — I130 Hypertensive heart and chronic kidney disease with heart failure and stage 1 through stage 4 chronic kidney disease, or unspecified chronic kidney disease: Secondary | ICD-10-CM | POA: Diagnosis not present

## 2022-10-03 NOTE — Telephone Encounter (Signed)
Advanced Heart Failure Patient Advocate Encounter   Patient was approved to receive Amvuttra from [place]  Effective dates: 09/29/22 through 11/20/22  [name], CPhT

## 2022-10-04 ENCOUNTER — Encounter (HOSPITAL_COMMUNITY): Payer: Self-pay | Admitting: Cardiology

## 2022-10-05 ENCOUNTER — Encounter (HOSPITAL_COMMUNITY): Payer: Self-pay

## 2022-10-05 ENCOUNTER — Ambulatory Visit (HOSPITAL_COMMUNITY)
Admission: RE | Admit: 2022-10-05 | Discharge: 2022-10-05 | Disposition: A | Discharge status: Home / Self Care | Payer: Medicare Other | Source: Ambulatory Visit | Attending: Family Medicine | Admitting: Family Medicine

## 2022-10-05 ENCOUNTER — Telehealth (HOSPITAL_COMMUNITY): Payer: Self-pay

## 2022-10-05 VITALS — BP 98/56 | HR 58 | Wt 158.4 lb

## 2022-10-05 DIAGNOSIS — I428 Other cardiomyopathies: Secondary | ICD-10-CM | POA: Insufficient documentation

## 2022-10-05 DIAGNOSIS — N481 Balanitis: Secondary | ICD-10-CM

## 2022-10-05 DIAGNOSIS — I639 Cerebral infarction, unspecified: Secondary | ICD-10-CM | POA: Diagnosis not present

## 2022-10-05 DIAGNOSIS — I2510 Atherosclerotic heart disease of native coronary artery without angina pectoris: Secondary | ICD-10-CM | POA: Insufficient documentation

## 2022-10-05 DIAGNOSIS — E854 Organ-limited amyloidosis: Secondary | ICD-10-CM

## 2022-10-05 DIAGNOSIS — I5022 Chronic systolic (congestive) heart failure: Secondary | ICD-10-CM

## 2022-10-05 DIAGNOSIS — Z8673 Personal history of transient ischemic attack (TIA), and cerebral infarction without residual deficits: Secondary | ICD-10-CM | POA: Diagnosis not present

## 2022-10-05 DIAGNOSIS — Z7901 Long term (current) use of anticoagulants: Secondary | ICD-10-CM | POA: Insufficient documentation

## 2022-10-05 DIAGNOSIS — Z79899 Other long term (current) drug therapy: Secondary | ICD-10-CM | POA: Insufficient documentation

## 2022-10-05 DIAGNOSIS — N472 Paraphimosis: Secondary | ICD-10-CM | POA: Diagnosis not present

## 2022-10-05 DIAGNOSIS — I480 Paroxysmal atrial fibrillation: Secondary | ICD-10-CM | POA: Diagnosis not present

## 2022-10-05 DIAGNOSIS — I43 Cardiomyopathy in diseases classified elsewhere: Secondary | ICD-10-CM | POA: Diagnosis not present

## 2022-10-05 DIAGNOSIS — I110 Hypertensive heart disease with heart failure: Secondary | ICD-10-CM | POA: Insufficient documentation

## 2022-10-05 DIAGNOSIS — E1142 Type 2 diabetes mellitus with diabetic polyneuropathy: Secondary | ICD-10-CM | POA: Insufficient documentation

## 2022-10-05 DIAGNOSIS — E785 Hyperlipidemia, unspecified: Secondary | ICD-10-CM

## 2022-10-05 MED ORDER — FUROSEMIDE 40 MG PO TABS: 80.0000 mg | ORAL_TABLET | Freq: Two times a day (BID) | ORAL | 6 refills | Status: DC

## 2022-10-05 MED ORDER — POTASSIUM CHLORIDE CRYS ER 20 MEQ PO TBCR: 20.0000 meq | EXTENDED_RELEASE_TABLET | Freq: Every day | ORAL | 3 refills | Status: DC

## 2022-10-05 MED ORDER — FLUCONAZOLE 150 MG PO TABS: 150.0000 mg | ORAL_TABLET | Freq: Once | ORAL | 0 refills | Status: AC

## 2022-10-05 NOTE — Telephone Encounter (Signed)
Advanced Heart Failure Patient Advocate Encounter  Application for Entresto faxed to [employer] on 10/05/2022. Submission includes POI records.  Clista Bernhardt, CPhT Rx Patient Advocate Phone: [phone]

## 2022-10-05 NOTE — Patient Instructions (Signed)
STOP Farxiga START Fluconazole 250 mg x 1 day INCREASE Lasix to 80 mg twice a day START Potassium 20 meq one tab daily  Labs needed in two weeks  You have been referred to [facility]  -they will be in contact with an appointment  Your physician wants you to follow-up in: 3 months with Dr Kendall Flack will receive a reminder letter in the mail two months in advance. If you don't receive a letter, please call our office to schedule the follow-up appointment.    Do the following things EVERYDAY: Weigh yourself in the morning before breakfast. Write it down and keep it in a log. Take your medicines as prescribed Eat low salt foods--Limit salt (sodium) to 2000 mg per day.  Stay as active as you can everyday Limit all fluids for the day to less than 2 liters   At the [facility], you and your health needs are our priority. As part of our continuing mission to provide you with exceptional heart care, we have created designated Provider Care Teams. These Care Teams include your primary Cardiologist (physician) and Advanced Practice Providers (APPs- Physician Assistants and Nurse Practitioners) who all work together to provide you with the care you need, when you need it.   You may see any of the following providers on your designated Care Team at your next follow up: Dr Glori Bickers Dr Loralie Champagne Dr. Roxana Hires, NP Lyda Jester, [place] [facility] [place], [place] Forestine Na, NP Audry Riles, PharmD   Please be sure to bring in all your medications bottles to every appointment.   If you have any questions or concerns before your next appointment please send [place] a message through [place] or call our office at [phone].    TO LEAVE A MESSAGE FOR THE NURSE SELECT OPTION 2, PLEASE LEAVE A MESSAGE INCLUDING: YOUR NAME DATE OF BIRTH CALL BACK NUMBER REASON FOR CALL**this is important as we prioritize the call backs  YOU WILL  RECEIVE A CALL BACK THE SAME DAY AS LONG AS YOU CALL BEFORE 4:00 PM.

## 2022-10-05 NOTE — Progress Notes (Signed)
PCP: McKeown, William, MD Cardiology: Dr. Klein HF Cardiology: Dr. McLean  81 y.o. with history of type 2 DM, HTN, CVA, paroxysmal atrial fibrillation, and chronic systolic CHF was referred by Dr. Klein for evaluation of CHF.  Patient is on Eliquis now for PAF, he had a CVA in 2018.  Echo at that time showed EF 55-60%.  In the summer of 2022, he developed progressive exertional dyspnea. He was seen in the ER and was noted to have mildly elevate HS-TnI without trend, thought to be due to volume overload from CHF.  Echo was done in 9/22 showing EF 25-30%, mild LVH, moderate RV enlargement with normal RV systolic function, PASP 65 mmHg, moderate TR, moderate AI, moderate MR, IVC dilated.  Cardiolite in 9/22 showed EF 27%, no ischemia.  Patient was started on Lasix due to volume overload and it was titrated up.  Patient does not drink ETOH, smoke, or use drugs.  He has not family history of heart disease.    RHC/LHC in 11/22 showed mild nonobstructive CAD, CI 4.36, optimized filling pressures.  Cardiac MRI in 1/23 showed LV EF 45%, mild RV dilation with RV EF 48%, mid-wall patchy LGE in the basal to mid septum, ECV 52% in septum, mild-moderate MR, mild-moderate AI. 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   Follow up 5/23, stable NYHA II-III symptoms. Volume stable on Lasix 80/40. Referred to Neurology for formal evaluation of peripheral neuropathy and to Dr. Joseph for genetics evaluation.  Today he returns for HF follow up with his daughter. Overall feeling fine. Walks 3 days/week about 1/4-1/2 mile with no dyspnea. He continues with balance instability since stroke. He has LE swelling, improved with addition of compression hose. He has had a rash on head of penis for awhile, no discharge. Denies palpitations, abnormal bleeding, CP, dizziness, edema, or PND/Orthopnea. Appetite ok. No fever or chills. Weight at home 154-155 pounds. Taking  all medications.   Labs (10/22): LDL 44, TSH normal Labs (11/22): K 4, creatinine 1.17 Labs (12/22): K 4.3, creatinine 1.22 Labs (2/23): K 4.1, creatinine 1.19, hgb 12, myeloma panel with IgG monoclonal protein.  Labs (5/23): K 4.2, creatinine 1.19 Labs (10/23): K 4.6, creatinine 1.08, LDL 48, HDL 61  ECG (personally reviewed): none ordered today.  PMH: 1. Type 2 diabetes 2. HTN 3. Hyperlipidemia 4. Atrial fibrillation: Paroxysmal 5. 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 Right-sided weakness.  6. GERD 7. Hypothyroidism 8. Chronic systolic CHF: Echo (9/18) with EF 55-60%.  - Echo (9/22): EF 25-30%, mild LVH, moderate RV enlargement with normal RV systolic function, PASP 65 mmHg, moderate TR, moderate AI, moderate MR, IVC dilated.  - Cardiolite (9/22): EF 27%, no ischemia.  - LHC/RHC (11/22): Mild nonobstructive CAD; mean RA 2, PA 41/7 mean 22, mean PCWP 11, CI 4.36 - Cardiac MRI (1/23): LV EF 45%, mild RV dilation with RV EF 48%, mid-wall patchy LGE in the basal to mid septum, ECV 52% in septum, mild-moderate MR, mild-moderate AI.  9. IgG kappa MGUS since 2004: Bone marrow biopsy 2004 with 2% plasma cells.  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  11. Peripheral neuropathy  Social History   Socioeconomic History   Marital status: Widowed    Spouse name: Not on file   Number of children: 2   Years of education: Not on file   Highest education level: Not on   file  Occupational History   Occupation: Herbalist: RETIRED  Tobacco Use   Smoking status: Never   Smokeless tobacco: Never  Vaping Use   Vaping Use: Never used  Substance and Sexual Activity   Alcohol use: No   Drug use: No   Sexual activity: Not on file  Other Topics Concern   Not on file  Social History Narrative   Right handed   Drinks caffeine prn   One story home   Social Determinants of  Health   Financial Resource Strain: Not on file  Food Insecurity: Not on file  Transportation Needs: Not on file  Physical Activity: Not on file  Stress: Not on file  Social Connections: Not on file  Intimate Partner Violence: Not on file   Family History  Problem Relation Age of Onset   Sickle cell anemia Mother    Diabetes Father    Stomach cancer Father    Colon cancer Father    CAD Neg Hx    Esophageal cancer Neg Hx    Rectal cancer Neg Hx    ROS: All systems reviewed and negative except as per HPI.   Current Outpatient Medications  Medication Sig Dispense Refill   atorvastatin (LIPITOR) 40 MG tablet TAKE 1 TABLET BY MOUTH  DAILY FOR CHOLESTEROL 90 tablet 3   Blood Glucose Monitoring Suppl (ONE TOUCH ULTRA 2) w/Device KIT Check blood sugar 1 time a day-DX-E11.9 1 kit 0   Cholecalciferol (VITAMIN D PO) Take 1 tablet by mouth daily.      Cyanocobalamin (B-12) 50 MCG TABS Take 50 mcg by mouth daily.     dapagliflozin propanediol (FARXIGA) 10 MG TABS tablet Take 1 tablet (10 mg total) by mouth daily before breakfast. 90 tablet 3   ELIQUIS 5 MG TABS tablet TAKE 1 TABLET BY MOUTH  TWICE DAILY TO PREVENT  BLOOD CLOTS 180 tablet 3   ferrous sulfate 325 (65 FE) MG tablet Take 325 mg by mouth at bedtime.     finasteride (PROSCAR) 5 MG tablet TAKE 1 TABLET BY MOUTH DAILY FOR PROSTATE 70 tablet 4   furosemide (LASIX) 40 MG tablet Take 80 mg by mouth daily. 40 mg in the evening     gabapentin (NEURONTIN) 300 MG capsule Take 300 mg by mouth daily.     glipiZIDE (GLUCOTROL) 5 MG tablet Patient takes 0.5 tablet by mouth twice a day.     Lancets (ONETOUCH DELICA PLUS [id number]) MISC CHECK BLOOD SUGAR ONCE A DAY. 100 each 3   Lancets Misc. (ONE TOUCH SURESOFT) MISC Check blood sugar 1 time a day-DX-E11.91 1 each 0   levothyroxine (SYNTHROID) 50 MCG tablet Take  1 tablet  Daily  on an empty stomach with only water for 30 minutes & no Antacid meds, Calcium or Magnesium for 4 hours & avoid Biotin                                           /                              TAKE                      BY  MOUTH 90 tablet 3   Magnesium 250 MG TABS Take 250 mg by mouth daily.      metFORMIN (GLUCOPHAGE-XR) 500 MG 24 hr tablet TAKE 2 TABLETS BY MOUTH TWICE  DAILY WITH MEALS FOR DIABETES 280 tablet 4   metoprolol succinate (TOPROL-XL) 50 MG 24 hr tablet TAKE 1 TABLET BY MOUTH AT  BEDTIME 90 tablet 3   Multiple Vitamin (MULTIVITAMIN) capsule Take 1 capsule by mouth daily.     ONETOUCH ULTRA test strip CHECK BLOOD SUGAR ONCE A DAY. 100 strip 3   sacubitril-valsartan (ENTRESTO) 24-26 MG Take 1 tablet by mouth 2 (two) times daily. 180 tablet 3   spironolactone (ALDACTONE) 25 MG tablet Take 1 tablet (25 mg total) by mouth at bedtime. 90 tablet 3   Tafamidis (VYNDAMAX) 61 MG CAPS Take 1 capsule by mouth daily. 30 capsule 11   triamcinolone cream (KENALOG) 0.1 % Apply 1 Application topically as needed.     trolamine salicylate (ASPERCREME) 10 % cream Apply 1 application topically daily as needed for muscle pain.     vitamin C (ASCORBIC ACID) 500 MG tablet Take 500 mg by mouth daily.     No current facility-administered medications for this encounter.   Wt Readings from Last 3 Encounters:  10/05/22 71.8 kg (158 lb 6.4 oz)  09/12/22 70.8 kg (156 lb)  08/17/22 73 kg (161 lb)   BP (!) 98/56   Pulse (!) 58   Wt 71.8 kg (158 lb 6.4 oz)   SpO2 100%   BMI 22.09 kg/m  Physical Exam General:  NAD. No resp difficulty, arrived in [facility], elderly HEENT: Normal Neck: Supple. No JVD. Carotids 2+ bilat; no bruits. No lymphadenopathy or thryomegaly appreciated. Cor: PMI nondisplaced. Regular rate & rhythm. No rubs, gallops, 1/6 SEM RUSB Lungs: Clear Abdomen: Soft, nontender, nondistended. No hepatosplenomegaly. No bruits or masses. Good bowel sounds. Extremities: No cyanosis, clubbing, rash, trace pedal edema; compression hose on GU: several red, inflamed,moist macular lesions on glans of  penis, no exudate noted. Neuro: Alert & oriented x 3, cranial nerves grossly intact. Moves all 4 extremities w/o difficulty. Affect pleasant.  Assessment/Plan: 1. Chronic systolic CHF:  Nonischemic cardiomyopathy.  Echo in 9/22 with EF 25-30%, mild LVH, moderate RV enlargement with normal RV systolic function, PASP 65 mmHg, moderate TR, moderate AI, moderate MR, IVC dilated.  LHC/RHC in 11/22 showed mild nonobstructive CAD with optimized filling pressures and preserved cardiac output.  No history of drugs/ETOH.  No family history of cardiomyopathy.  Cardiac MRI in 1/23 showed LV EF 45%, mild RV dilation with RV EF 48%, mid-wall patchy LGE in the basal to mid septum, ECV 52% in septum, mild-moderate MR, mild-moderate AI.  PYP scan was suggestive of TTR cardiac amyloidosis.  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 On exam, he is not significantly volume overloaded.  NYHA class II-III symptoms.   - With balanitis, stop Farxiga. - Increase Lasix to 80 mg bid, add 20 KCL daily. Labs from 10/23 reviewed, K 4.6, creatinine 1.08. Repeat BMET in 10-14 days. - Continue Entresto 24/26 bid, no BP room to increase.    - Continue Toprol XL 50 mg qhs, no BP room to increase.   - Continue spironolactone 25 mg qhs.   - Narrow QRS, not candidate for CRT.  EF on cMRI out of range for ICD.   2. Atrial fibrillation: Paroxysmal. History of CVA. He is regular on exam today. - Continue Eliquis. Labs  from 09/12/22 reviewed and stable, hgb 13.4 3. Hyperlipidemia: Good lipids in 10/23.  - Continue atorvastatin.  4. Cardiac amyloidosis: PYP scan suggestive of TTR cardiac amyloidosis, cardiac MRI also suggestive of amyloidosis. He has peripheral neuropathy and autonomic neuropathy (orthostatic symptoms). 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  - Continue tafamidis.  - He has been seen by Dr. Patel with Neurology and approval for vutrisiran in -process. Personally  discussed with PharmD today.  - He has been referred to Dr. Joseph for genetics evaluation. Has follow up 12/2022. 5. Balanitis: Stop Farxiga. ? Mild paraphimosis as well. - Give fluconazole 150 mg x 1 today. Recent QTc 453 msec.  - Patient had circumcision several years ago, does not remember with whom.  - Refer to [facility].  Follow up in 3 months with Dr. McLean.  Jessica M Milford FNP-BC 10/05/2022 

## 2022-10-06 ENCOUNTER — Telehealth (HOSPITAL_COMMUNITY): Payer: Self-pay | Admitting: Cardiology

## 2022-10-06 NOTE — Telephone Encounter (Signed)
[facility] referral sent as requested per provider Ov, labs, demographics, abd u/s sent  Confirmation received

## 2022-10-10 DIAGNOSIS — I130 Hypertensive heart and chronic kidney disease with heart failure and stage 1 through stage 4 chronic kidney disease, or unspecified chronic kidney disease: Secondary | ICD-10-CM | POA: Diagnosis not present

## 2022-10-10 DIAGNOSIS — I509 Heart failure, unspecified: Secondary | ICD-10-CM | POA: Diagnosis not present

## 2022-10-10 DIAGNOSIS — N182 Chronic kidney disease, stage 2 (mild): Secondary | ICD-10-CM | POA: Diagnosis not present

## 2022-10-10 DIAGNOSIS — E8582 Wild-type transthyretin-related (ATTR) amyloidosis: Secondary | ICD-10-CM | POA: Diagnosis not present

## 2022-10-10 DIAGNOSIS — E1122 Type 2 diabetes mellitus with diabetic chronic kidney disease: Secondary | ICD-10-CM | POA: Diagnosis not present

## 2022-10-10 DIAGNOSIS — E1141 Type 2 diabetes mellitus with diabetic mononeuropathy: Secondary | ICD-10-CM | POA: Diagnosis not present

## 2022-10-11 ENCOUNTER — Other Ambulatory Visit (HOSPITAL_COMMUNITY): Payer: Self-pay

## 2022-10-11 NOTE — Telephone Encounter (Signed)
Advanced Heart Failure Patient Advocate Encounter   Patient was approved to receive Entresto from [employer]  Effective dates: 10/11/22 through 11/21/23  Document scanned to chart. Updated patient's daughter via mychart.  [name], CPhT

## 2022-10-12 ENCOUNTER — Encounter (HOSPITAL_COMMUNITY): Payer: Self-pay | Admitting: Cardiology

## 2022-10-12 DIAGNOSIS — H52203 Unspecified astigmatism, bilateral: Secondary | ICD-10-CM | POA: Diagnosis not present

## 2022-10-12 DIAGNOSIS — H31001 Unspecified chorioretinal scars, right eye: Secondary | ICD-10-CM | POA: Diagnosis not present

## 2022-10-12 DIAGNOSIS — E119 Type 2 diabetes mellitus without complications: Secondary | ICD-10-CM | POA: Diagnosis not present

## 2022-10-12 DIAGNOSIS — H5203 Hypermetropia, bilateral: Secondary | ICD-10-CM | POA: Diagnosis not present

## 2022-10-12 LAB — HM DIABETES EYE EXAM

## 2022-10-14 ENCOUNTER — Other Ambulatory Visit (HOSPITAL_COMMUNITY): Payer: Self-pay

## 2022-10-17 DIAGNOSIS — E1122 Type 2 diabetes mellitus with diabetic chronic kidney disease: Secondary | ICD-10-CM | POA: Diagnosis not present

## 2022-10-17 DIAGNOSIS — E1141 Type 2 diabetes mellitus with diabetic mononeuropathy: Secondary | ICD-10-CM | POA: Diagnosis not present

## 2022-10-17 DIAGNOSIS — N182 Chronic kidney disease, stage 2 (mild): Secondary | ICD-10-CM | POA: Diagnosis not present

## 2022-10-17 DIAGNOSIS — I130 Hypertensive heart and chronic kidney disease with heart failure and stage 1 through stage 4 chronic kidney disease, or unspecified chronic kidney disease: Secondary | ICD-10-CM | POA: Diagnosis not present

## 2022-10-17 DIAGNOSIS — I509 Heart failure, unspecified: Secondary | ICD-10-CM | POA: Diagnosis not present

## 2022-10-17 DIAGNOSIS — E8582 Wild-type transthyretin-related (ATTR) amyloidosis: Secondary | ICD-10-CM | POA: Diagnosis not present

## 2022-10-17 NOTE — Telephone Encounter (Signed)
Medication Samples have been provided to the patient.  Drug name: entresto       Strength: 24/26 mg        Qty: 28  LOT: [id number]  Exp.Date: 06/2024  Dosing instructions: one tab twice a day   The patient has been instructed regarding the correct time, dose, and frequency of taking this medication, including desired effects and most common side effects.   Garlan Fair M 2:02 PM 10/17/2022

## 2022-10-18 ENCOUNTER — Other Ambulatory Visit (HOSPITAL_COMMUNITY): Payer: Self-pay

## 2022-10-18 ENCOUNTER — Encounter (HOSPITAL_COMMUNITY): Payer: Self-pay | Admitting: Cardiology

## 2022-10-19 ENCOUNTER — Ambulatory Visit (HOSPITAL_COMMUNITY)
Admission: RE | Admit: 2022-10-19 | Discharge: 2022-10-19 | Disposition: A | Discharge status: Home / Self Care | Payer: Medicare Other | Source: Ambulatory Visit | Attending: Internal Medicine | Admitting: Internal Medicine

## 2022-10-19 DIAGNOSIS — I5022 Chronic systolic (congestive) heart failure: Secondary | ICD-10-CM | POA: Diagnosis not present

## 2022-10-19 LAB — BASIC METABOLIC PANEL
Anion gap: 10 (ref 5–15)
BUN: 20 mg/dL (ref 8–23)
CO2: 25 mmol/L (ref 22–32)
Calcium: 9.2 mg/dL (ref 8.9–10.3)
Chloride: 106 mmol/L (ref 98–111)
Creatinine, Ser: 1.08 mg/dL (ref 0.61–1.24)
GFR, Estimated: >60 mL/min (ref >60)
Glucose, Bld: 80 mg/dL (ref 70–99)
Potassium: 4.2 mmol/L (ref 3.5–5.1)
Sodium: 141 mmol/L (ref 135–145)

## 2022-10-24 ENCOUNTER — Other Ambulatory Visit (HOSPITAL_COMMUNITY): Payer: Self-pay

## 2022-10-24 DIAGNOSIS — I509 Heart failure, unspecified: Secondary | ICD-10-CM | POA: Diagnosis not present

## 2022-10-24 DIAGNOSIS — E8582 Wild-type transthyretin-related (ATTR) amyloidosis: Secondary | ICD-10-CM | POA: Diagnosis not present

## 2022-10-24 DIAGNOSIS — I130 Hypertensive heart and chronic kidney disease with heart failure and stage 1 through stage 4 chronic kidney disease, or unspecified chronic kidney disease: Secondary | ICD-10-CM | POA: Diagnosis not present

## 2022-10-24 DIAGNOSIS — E1141 Type 2 diabetes mellitus with diabetic mononeuropathy: Secondary | ICD-10-CM | POA: Diagnosis not present

## 2022-10-24 DIAGNOSIS — N182 Chronic kidney disease, stage 2 (mild): Secondary | ICD-10-CM | POA: Diagnosis not present

## 2022-10-24 DIAGNOSIS — E1122 Type 2 diabetes mellitus with diabetic chronic kidney disease: Secondary | ICD-10-CM | POA: Diagnosis not present

## 2022-10-25 ENCOUNTER — Telehealth (HOSPITAL_COMMUNITY): Payer: Self-pay | Admitting: Pharmacy Technician

## 2022-10-25 ENCOUNTER — Other Ambulatory Visit (HOSPITAL_COMMUNITY): Payer: Self-pay

## 2022-10-25 NOTE — Telephone Encounter (Signed)
Patient Advocate Encounter   Received notification from [facility] that prior authorization for Vyndamax is required.   PA submitted on CoverMyMeds Key [id number] Status is pending   Will continue to follow.

## 2022-10-26 ENCOUNTER — Other Ambulatory Visit (HOSPITAL_COMMUNITY): Payer: Self-pay

## 2022-10-26 ENCOUNTER — Encounter (HOSPITAL_COMMUNITY): Payer: Self-pay | Admitting: Cardiology

## 2022-10-26 NOTE — Telephone Encounter (Signed)
Advanced Heart Failure Patient Advocate Encounter  Prior Authorization for Earl [name] has been approved.    PA#  [id number] Effective dates: 10/26/22 through 11/21/23  [name], CPhT

## 2022-10-28 ENCOUNTER — Other Ambulatory Visit (HOSPITAL_COMMUNITY): Payer: Self-pay

## 2022-10-28 MED ORDER — FLUCONAZOLE 150 MG PO TABS: 150.0000 mg | ORAL_TABLET | Freq: Once | ORAL | 0 refills | Status: AC

## 2022-10-31 DIAGNOSIS — I130 Hypertensive heart and chronic kidney disease with heart failure and stage 1 through stage 4 chronic kidney disease, or unspecified chronic kidney disease: Secondary | ICD-10-CM | POA: Diagnosis not present

## 2022-10-31 DIAGNOSIS — E8582 Wild-type transthyretin-related (ATTR) amyloidosis: Secondary | ICD-10-CM | POA: Diagnosis not present

## 2022-10-31 DIAGNOSIS — N182 Chronic kidney disease, stage 2 (mild): Secondary | ICD-10-CM | POA: Diagnosis not present

## 2022-10-31 DIAGNOSIS — E1141 Type 2 diabetes mellitus with diabetic mononeuropathy: Secondary | ICD-10-CM | POA: Diagnosis not present

## 2022-10-31 DIAGNOSIS — I509 Heart failure, unspecified: Secondary | ICD-10-CM | POA: Diagnosis not present

## 2022-10-31 DIAGNOSIS — E1122 Type 2 diabetes mellitus with diabetic chronic kidney disease: Secondary | ICD-10-CM | POA: Diagnosis not present

## 2022-11-02 ENCOUNTER — Ambulatory Visit (INDEPENDENT_AMBULATORY_CARE_PROVIDER_SITE_OTHER): Payer: Medicare Other | Admitting: Podiatry

## 2022-11-02 ENCOUNTER — Encounter: Payer: Self-pay | Admitting: Podiatry

## 2022-11-02 VITALS — BP 107/53 | HR 60

## 2022-11-02 DIAGNOSIS — E1142 Type 2 diabetes mellitus with diabetic polyneuropathy: Secondary | ICD-10-CM

## 2022-11-02 DIAGNOSIS — M79676 Pain in unspecified toe(s): Secondary | ICD-10-CM | POA: Diagnosis not present

## 2022-11-02 DIAGNOSIS — L84 Corns and callosities: Secondary | ICD-10-CM | POA: Diagnosis not present

## 2022-11-02 DIAGNOSIS — B351 Tinea unguium: Secondary | ICD-10-CM

## 2022-11-04 ENCOUNTER — Other Ambulatory Visit: Payer: Self-pay | Admitting: Internal Medicine

## 2022-11-04 ENCOUNTER — Other Ambulatory Visit: Payer: Self-pay

## 2022-11-04 DIAGNOSIS — R195 Other fecal abnormalities: Secondary | ICD-10-CM

## 2022-11-04 DIAGNOSIS — Z1211 Encounter for screening for malignant neoplasm of colon: Secondary | ICD-10-CM

## 2022-11-04 LAB — POC HEMOCCULT BLD/STL (HOME/3-CARD/SCREEN)
Card #2 Fecal Occult Blod, POC: POSITIVE
Card #3 Fecal Occult Blood, POC: POSITIVE
Fecal Occult Blood, POC: POSITIVE — AB

## 2022-11-06 NOTE — Progress Notes (Signed)
  Subjective:  Patient ID: Earl [name], male    DOB: July 19, 1941,  MRN: [id number]  Shine Mikes presents to clinic today for at risk foot care with history of diabetic neuropathy and preulcerative lesion(s) distal tip of left great toe and painful mycotic toenails that limit ambulation. Painful toenails interfere with ambulation. Aggravating factors include wearing enclosed shoe gear. Pain is relieved with periodic professional debridement. Painful porokeratotic lesions are aggravated when weightbearing with and without shoegear. Pain is relieved with periodic professional debridement.   His daughter is present during today's visit. Chief Complaint  Patient presents with   Diabetes    Diabetic foot care,  A1c-6.3 BG- 131( yesterday), PCP seen a month ago    New problem(s): None.   PCP is [name], MD.  Allergies  Allergen Reactions   Penicillins Other (See Comments)    Did it involve swelling of the face/tongue/throat, SOB, or low BP? Unknown Did it involve sudden or severe rash/hives, skin peeling, or any reaction on the inside of your mouth or nose? Unknown Did you need to seek medical attention at a hospital or doctor's office? No When did it last happen?    Over 29 Years Ago   If all above answers are "NO", may proceed with cephalosporin use.     Review of Systems: Negative except as noted in the HPI.  Objective: No changes noted in today's physical examination. Vitals:   11/02/22 1524  BP: (!) 107/53  Pulse: 60   Zymarion Favorite is a pleasant 81 y.o. male WD, WN in NAD. AAO x 3.  Neurovascular Examination: Capillary refill time to digits immediate b/l. Palpable pedal pulses b/l LE. Pedal hair sparse. No pain with calf compression b/l. Lower extremity skin temperature gradient within normal limits. No edema noted b/l LE. No ischemia or gangrene noted b/l LE. No cyanosis or clubbing noted b/l LE.  He is wearing open toed compression hose  today.  Protective sensation intact 5/5 intact bilaterally with 10g monofilament b/l. Vibratory sensation intact b/l.  Dermatological:  Pedal skin is warm and supple b/l LE. No open wounds b/l LE. No interdigital macerations noted b/l LE.   Toenails 1-5 bilaterally elongated, discolored, dystrophic, thickened, and crumbly with subungual debris and tenderness to dorsal palpation.   Preulcerative lesion noted distal tip of left hallux with subdermal hemorrhage No erythema, no edema, no drainage, no fluctuance.  Musculoskeletal:  Muscle strength 5/5 to all lower extremity muscle groups bilaterally. No pain, crepitus or joint limitation noted with ROM bilateral LE.  Assessment/Plan: 1. Pain due to onychomycosis of toenail   2. Pre-ulcerative calluses   3. Diabetic polyneuropathy associated with type 2 diabetes mellitus ([place])     No orders of the defined types were placed in this encounter.   -Patient's family member present. All questions/concerns addressed on today's visit. -Patient has obtained and is wearing open toed compression hose. -Continue diabetic foot care principles: inspect feet daily, monitor glucose as recommended by PCP and/or Endocrinologist, and follow prescribed diet per PCP, Endocrinologist and/or dietician. -Toenails 1-5 b/l were debrided in length and girth with sterile nail nippers and dremel without iatrogenic bleeding.  -Preulcerative lesion pared distal tip of left great toe utilizing sterile scalpel blade. Total number pared=1. -Patient/POA to call should there be question/concern in the interim.   Return in about 3 months (around 02/01/2023).  Marzetta Board, DPM

## 2022-11-07 ENCOUNTER — Encounter (HOSPITAL_COMMUNITY): Payer: Self-pay | Admitting: Cardiology

## 2022-11-07 ENCOUNTER — Telehealth: Payer: Self-pay

## 2022-11-07 DIAGNOSIS — I130 Hypertensive heart and chronic kidney disease with heart failure and stage 1 through stage 4 chronic kidney disease, or unspecified chronic kidney disease: Secondary | ICD-10-CM | POA: Diagnosis not present

## 2022-11-07 DIAGNOSIS — Z1211 Encounter for screening for malignant neoplasm of colon: Secondary | ICD-10-CM | POA: Diagnosis not present

## 2022-11-07 DIAGNOSIS — I509 Heart failure, unspecified: Secondary | ICD-10-CM | POA: Diagnosis not present

## 2022-11-07 DIAGNOSIS — N182 Chronic kidney disease, stage 2 (mild): Secondary | ICD-10-CM | POA: Diagnosis not present

## 2022-11-07 DIAGNOSIS — E8582 Wild-type transthyretin-related (ATTR) amyloidosis: Secondary | ICD-10-CM | POA: Diagnosis not present

## 2022-11-07 DIAGNOSIS — E1141 Type 2 diabetes mellitus with diabetic mononeuropathy: Secondary | ICD-10-CM | POA: Diagnosis not present

## 2022-11-07 DIAGNOSIS — Z1212 Encounter for screening for malignant neoplasm of rectum: Secondary | ICD-10-CM | POA: Diagnosis not present

## 2022-11-07 DIAGNOSIS — E1122 Type 2 diabetes mellitus with diabetic chronic kidney disease: Secondary | ICD-10-CM | POA: Diagnosis not present

## 2022-11-07 NOTE — Telephone Encounter (Signed)
LMOM to call back

## 2022-11-07 NOTE — Telephone Encounter (Addendum)
Patient aware and would like [place] to contact his daughter as well. Daughter notified

## 2022-11-07 NOTE — Telephone Encounter (Signed)
-----   Message from [name], MD sent at 11/04/2022  6:40 PM EST ----- Regarding: (+) Hemoccult  Please inform patient Stool Hemoccult was (+) positive for Blood &   Consultation requested to see Dr  Havery Moros again  Thanks

## 2022-11-08 ENCOUNTER — Other Ambulatory Visit: Payer: Self-pay

## 2022-11-10 ENCOUNTER — Other Ambulatory Visit: Payer: Self-pay

## 2022-11-10 ENCOUNTER — Encounter: Payer: Self-pay | Admitting: Gastroenterology

## 2022-11-17 ENCOUNTER — Other Ambulatory Visit (HOSPITAL_COMMUNITY): Payer: Self-pay

## 2022-11-17 ENCOUNTER — Other Ambulatory Visit: Payer: Self-pay

## 2022-11-17 DIAGNOSIS — E1141 Type 2 diabetes mellitus with diabetic mononeuropathy: Secondary | ICD-10-CM | POA: Diagnosis not present

## 2022-11-17 DIAGNOSIS — I509 Heart failure, unspecified: Secondary | ICD-10-CM | POA: Diagnosis not present

## 2022-11-17 DIAGNOSIS — N182 Chronic kidney disease, stage 2 (mild): Secondary | ICD-10-CM | POA: Diagnosis not present

## 2022-11-17 DIAGNOSIS — E1122 Type 2 diabetes mellitus with diabetic chronic kidney disease: Secondary | ICD-10-CM | POA: Diagnosis not present

## 2022-11-17 DIAGNOSIS — E8582 Wild-type transthyretin-related (ATTR) amyloidosis: Secondary | ICD-10-CM | POA: Diagnosis not present

## 2022-11-17 DIAGNOSIS — I130 Hypertensive heart and chronic kidney disease with heart failure and stage 1 through stage 4 chronic kidney disease, or unspecified chronic kidney disease: Secondary | ICD-10-CM | POA: Diagnosis not present

## 2022-11-22 ENCOUNTER — Telehealth: Payer: Self-pay | Admitting: Neurology

## 2022-11-22 NOTE — Telephone Encounter (Signed)
Called [facility] and provided verbal orders.

## 2022-11-22 NOTE — Telephone Encounter (Signed)
Need Home Health PT to extend with a frequency of once a week for 4 more weeks.

## 2022-11-22 NOTE — Telephone Encounter (Signed)
OK to give orders for this.

## 2022-11-23 ENCOUNTER — Encounter (HOSPITAL_COMMUNITY): Payer: Self-pay | Admitting: Cardiology

## 2022-11-23 ENCOUNTER — Ambulatory Visit: Payer: Medicare Other | Admitting: Neurology

## 2022-11-24 ENCOUNTER — Other Ambulatory Visit (HOSPITAL_COMMUNITY): Payer: Self-pay

## 2022-11-24 DIAGNOSIS — I130 Hypertensive heart and chronic kidney disease with heart failure and stage 1 through stage 4 chronic kidney disease, or unspecified chronic kidney disease: Secondary | ICD-10-CM | POA: Diagnosis not present

## 2022-11-24 DIAGNOSIS — E1122 Type 2 diabetes mellitus with diabetic chronic kidney disease: Secondary | ICD-10-CM | POA: Diagnosis not present

## 2022-11-24 DIAGNOSIS — I509 Heart failure, unspecified: Secondary | ICD-10-CM | POA: Diagnosis not present

## 2022-11-24 DIAGNOSIS — N182 Chronic kidney disease, stage 2 (mild): Secondary | ICD-10-CM | POA: Diagnosis not present

## 2022-11-24 DIAGNOSIS — E8582 Wild-type transthyretin-related (ATTR) amyloidosis: Secondary | ICD-10-CM | POA: Diagnosis not present

## 2022-11-24 DIAGNOSIS — E1141 Type 2 diabetes mellitus with diabetic mononeuropathy: Secondary | ICD-10-CM | POA: Diagnosis not present

## 2022-11-28 DIAGNOSIS — I130 Hypertensive heart and chronic kidney disease with heart failure and stage 1 through stage 4 chronic kidney disease, or unspecified chronic kidney disease: Secondary | ICD-10-CM | POA: Diagnosis not present

## 2022-11-28 DIAGNOSIS — I509 Heart failure, unspecified: Secondary | ICD-10-CM | POA: Diagnosis not present

## 2022-11-28 DIAGNOSIS — N182 Chronic kidney disease, stage 2 (mild): Secondary | ICD-10-CM | POA: Diagnosis not present

## 2022-11-28 DIAGNOSIS — E1122 Type 2 diabetes mellitus with diabetic chronic kidney disease: Secondary | ICD-10-CM | POA: Diagnosis not present

## 2022-11-28 DIAGNOSIS — E8582 Wild-type transthyretin-related (ATTR) amyloidosis: Secondary | ICD-10-CM | POA: Diagnosis not present

## 2022-11-28 DIAGNOSIS — E1141 Type 2 diabetes mellitus with diabetic mononeuropathy: Secondary | ICD-10-CM | POA: Diagnosis not present

## 2022-11-29 ENCOUNTER — Encounter (HOSPITAL_COMMUNITY): Payer: Self-pay | Admitting: Cardiology

## 2022-11-30 ENCOUNTER — Other Ambulatory Visit (HOSPITAL_COMMUNITY): Payer: Self-pay

## 2022-11-30 MED ORDER — SPIRONOLACTONE 25 MG PO TABS: 25.0000 mg | ORAL_TABLET | Freq: Every day | ORAL | 3 refills | Status: DC

## 2022-12-01 ENCOUNTER — Telehealth: Payer: Self-pay | Admitting: Oncology

## 2022-12-01 NOTE — Telephone Encounter (Signed)
Contacted patient regarding provider departure, patient is notified. Patient would like appointments to be cancelled and when ready.

## 2022-12-04 ENCOUNTER — Encounter (HOSPITAL_COMMUNITY): Payer: Self-pay | Admitting: Cardiology

## 2022-12-05 ENCOUNTER — Other Ambulatory Visit (HOSPITAL_COMMUNITY): Payer: Self-pay

## 2022-12-05 MED ORDER — FLUCONAZOLE 150 MG PO TABS: 150.0000 mg | ORAL_TABLET | Freq: Once | ORAL | 0 refills | Status: AC

## 2022-12-07 ENCOUNTER — Telehealth: Payer: Self-pay

## 2022-12-07 DIAGNOSIS — R2681 Unsteadiness on feet: Secondary | ICD-10-CM

## 2022-12-07 DIAGNOSIS — G63 Polyneuropathy in diseases classified elsewhere: Secondary | ICD-10-CM

## 2022-12-07 DIAGNOSIS — I130 Hypertensive heart and chronic kidney disease with heart failure and stage 1 through stage 4 chronic kidney disease, or unspecified chronic kidney disease: Secondary | ICD-10-CM | POA: Diagnosis not present

## 2022-12-07 DIAGNOSIS — I509 Heart failure, unspecified: Secondary | ICD-10-CM | POA: Diagnosis not present

## 2022-12-07 DIAGNOSIS — E1122 Type 2 diabetes mellitus with diabetic chronic kidney disease: Secondary | ICD-10-CM | POA: Diagnosis not present

## 2022-12-07 DIAGNOSIS — N182 Chronic kidney disease, stage 2 (mild): Secondary | ICD-10-CM | POA: Diagnosis not present

## 2022-12-07 DIAGNOSIS — E1141 Type 2 diabetes mellitus with diabetic mononeuropathy: Secondary | ICD-10-CM | POA: Diagnosis not present

## 2022-12-07 DIAGNOSIS — E8582 Wild-type transthyretin-related (ATTR) amyloidosis: Secondary | ICD-10-CM | POA: Diagnosis not present

## 2022-12-07 NOTE — Telephone Encounter (Signed)
Earl [name] is calling in from [facility], requesting a DME order for rolling walker to be prescribed and faxed to local medical supply store.

## 2022-12-08 MED ORDER — NONFORMULARY OR COMPOUNDED ITEM: 0 refills | Status: DC

## 2022-12-08 NOTE — Telephone Encounter (Signed)
Rx printed. Faxing to [facility].

## 2022-12-08 NOTE — Addendum Note (Signed)
Addended by: Armen Pickup A on: 12/08/2022 09:19 AM   Modules accepted: Orders

## 2022-12-09 ENCOUNTER — Other Ambulatory Visit (HOSPITAL_COMMUNITY): Payer: Self-pay

## 2022-12-12 DIAGNOSIS — E8582 Wild-type transthyretin-related (ATTR) amyloidosis: Secondary | ICD-10-CM | POA: Diagnosis not present

## 2022-12-12 DIAGNOSIS — I130 Hypertensive heart and chronic kidney disease with heart failure and stage 1 through stage 4 chronic kidney disease, or unspecified chronic kidney disease: Secondary | ICD-10-CM | POA: Diagnosis not present

## 2022-12-12 DIAGNOSIS — N182 Chronic kidney disease, stage 2 (mild): Secondary | ICD-10-CM | POA: Diagnosis not present

## 2022-12-12 DIAGNOSIS — I509 Heart failure, unspecified: Secondary | ICD-10-CM | POA: Diagnosis not present

## 2022-12-12 DIAGNOSIS — E1141 Type 2 diabetes mellitus with diabetic mononeuropathy: Secondary | ICD-10-CM | POA: Diagnosis not present

## 2022-12-12 DIAGNOSIS — E1122 Type 2 diabetes mellitus with diabetic chronic kidney disease: Secondary | ICD-10-CM | POA: Diagnosis not present

## 2022-12-13 ENCOUNTER — Encounter: Payer: Self-pay | Admitting: Internal Medicine

## 2022-12-13 ENCOUNTER — Encounter (HOSPITAL_COMMUNITY): Payer: Self-pay | Admitting: Cardiology

## 2022-12-13 ENCOUNTER — Encounter: Payer: Self-pay | Admitting: Nurse Practitioner

## 2022-12-13 NOTE — Progress Notes (Unsigned)
3 MONTH FOLLOW UP Assessment:   Abdominal aortic atherosclerosis (HCC) Control blood pressure, cholesterol, glucose, increase exercise.   Essential hypertension Off of lisinopril and coreg due to hypotension; currently well controlled, edema improved with furosemide; monitor -     CBC with Differential/Platelet -     COMPLETE METABOLIC PANEL WITH GFR -     TSH - continue medications, DASH diet, exercise and monitor at home. Call if greater than 130/80.   Paroxysmal atrial fibrillation (HCC)/ acquired thrombophilia Continue medications, no bleeding concerns, rate controlled, follow up cardio  Hyperlipidemia associated with type 2 diabetes mellitus (HCC) -     Lipid panel check lipids decrease fatty foods increase activity.   Type 2 diabetes mellitus with stage 2 chronic kidney disease, without long-term current use of insulin (HCC) -     Hemoglobin A1c Discussed general issues about diabetes pathophysiology and management., Educational material distributed., Suggested low cholesterol diet., Encouraged aerobic exercise., Discussed foot care., Reminded to get yearly retinal exam.  CKD stage 2 due to type 2 diabetes mellitus ([place]) -     Hemoglobin A1c Discussed general issues about diabetes pathophysiology and management., Educational material distributed., Suggested low cholesterol diet., Encouraged aerobic exercise., Discussed foot care., Reminded to get yearly retinal exam.  Hypoalbuminemia due to protein-calorie malnutrition ([place]) High protein diet encouraged; monitor  -     COMPLETE METABOLIC PANEL WITH GFR  Hypothyroidism, unspecified type -     TSH Hypothyroidism-check TSH level, continue medications the same, reminded to take on an empty stomach 30-73mns before food.   MGUS (monoclonal gammopathy of unknown significance) Continue to monitor; Dr. 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, post-stroke Continue to walk with a cane, no falls, monitor  Neurology is also following   Over 30 minutes of exam, counseling, chart review, and critical decision making was performed  Future Appointments  Date Time Provider [place] 12/14/2022 10:30 AM PNarda AmberK, DO [facility] None  12/14/2022  3:30 PM WAlycia Rossetti NP [facility] None  12/29/2022  3:00 PM Earl [name] 01/03/2023  3:30 PM JDebbe Mounts [name] [facility]  02/22/2023  3:30 PM Earl [name] DPM [facility]  03/15/2023  2:30 PM MUnk Pinto [name] [facility] None  09/20/2023  3:00 PM MUnk Pinto [name] [facility] None    Subjective:  [name] a 82y.o. male who presents for  3 month follow up for HTN, hyperlipidemia DMII, CKD, atrial fibrillation, BPH, and vitamin D Def.    Wife Earl Mediapassed earlier this year, had been married 515years. His daughter Earl [name] stays with him twice a week, Son also checks in on him. He is doing better. He was prescribed xanax when wife passed, hasn't used in 7-8 months and ready to d/c.   He also has history of CVA in 2018, Rt cerebellar thrombotic and placed on Earl [name] for Afib.  He uses a cane for gait stability for right ataxia sp CVA. He did follow with speeth therapy with sigificant improvement, recent with mild intermittent dysphagia only.  He is driving locally to appointments, no highway/no night driving.   Initially unsure of correct medications, called back to verify at home. Using pill box. Believes good compliance with medications at this time.   BMI is  There is no height or weight on file to calculate BMI., he is working on diet and exercise. Wt Readings from Last 3 Encounters:  10/05/22 158 lb 6.4 oz (71.8 kg)  09/12/22 156 lb (70.8 kg)  08/17/22 161 lb (73 kg)   He also  follows with Dr Earl [name] for MGUS.   His blood pressure has been controlled at home, today their BP is   He does not workout. He denies chest pain, shortness of breath, does endorse episodes of feeling "woozy" with rapid position changes. Has seen neuro and   He has p. A. Fib, on elequis 5 mg BID, also prescribed carvedilol 3.125 mg BID but states hasn't been taking this, BPs have been in good range per home log - 120/130s/70s, etc, does ? take furosemide 40 mg daily but he is unsure, will verify meds at home and follow up. Wearing compression hose for edema, recently improved. Follows with cardiology Dr. Caryl [name].   Last ECHO 07/2017 Wall thickness was increased in a pattern of    moderate LVH. Systolic function was normal. The estimated    ejection fraction was in the range of 55% to 60%. Wall motion was    normal; there were no regional wall motion abnormalities  He has abdominal aortic atherosclerosis per CT 2016.   He is on cholesterol medication (atorvastatin 40 mg daily) and denies myalgias. His cholesterol is at goal. The cholesterol last visit was:   Lab Results  Component Value Date   CHOL 119 09/12/2022   HDL 61 09/12/2022   LDLCALC 48 09/12/2022   TRIG 34 09/12/2022   CHOLHDL 2.0 09/12/2022   He has not been working on diet and exercise for diabetes with CKD, and denies hyperglycemia, hypoglycemia , nausea, polydipsia and polyuria.  He is on gabapentin for neuropathy, only on 2 pills at night.  He takes metformin 2000 mg daily, glipizide 5 mg daily,  Fasting ranges 110-150s, typically 120-140s average per patient  Last A1C in the office was:  Lab Results  Component Value Date   HGBA1C 6.3 (H) 09/12/2022    CKD II associated with T2DM, monitoring, off of lisinopril due to hypotension Lab Results  Component Value Date   GFRAA 77 05/17/2021   Patient is on Vitamin D supplement.   Lab Results  Component Value Date   VD25OH 66 09/12/2022     He is on thyroid medication. His  medication was not changed last visit.   Lab Results  Component Value Date   TSH 0.83 09/12/2022  .   Medication Review:  Current Outpatient Medications (Endocrine & Metabolic):    glipiZIDE (GLUCOTROL) 5 MG tablet, Patient takes 0.5 tablet by mouth twice a day.   levothyroxine (SYNTHROID) 50 MCG tablet, Take  1 tablet  Daily  on an empty stomach with only water for 30 minutes & no Antacid meds, Calcium or Magnesium for 4 hours & avoid Biotin                                          /                              TAKE                      BY  MOUTH   metFORMIN (GLUCOPHAGE-XR) 500 MG 24 hr tablet, TAKE 2 TABLETS BY MOUTH TWICE  DAILY WITH MEALS FOR DIABETES  Current Outpatient Medications (Cardiovascular):    atorvastatin (LIPITOR) 40 MG tablet, TAKE 1 TABLET BY MOUTH  DAILY FOR CHOLESTEROL   furosemide (LASIX) 40 MG tablet, Take 2 tablets (80 mg total) by mouth 2 (two) times daily.   metoprolol succinate (TOPROL-XL) 50 MG 24 hr tablet, TAKE 1 TABLET BY MOUTH AT  BEDTIME   sacubitril-valsartan (ENTRESTO) 24-26 MG, Take 1 tablet by mouth 2 (two) times daily.   spironolactone (ALDACTONE) 25 MG tablet, Take 1 tablet (25 mg total) by mouth at bedtime.   Tafamidis (VYNDAMAX) 61 MG CAPS, Take 1 capsule by mouth daily.    Current Outpatient Medications (Hematological):    Cyanocobalamin (B-12) 50 MCG TABS, Take 50 mcg by mouth daily.   Earl [name] 5 MG TABS tablet, TAKE 1 TABLET BY MOUTH  TWICE DAILY TO PREVENT  BLOOD CLOTS   ferrous sulfate 325 (65 FE) MG tablet, Take 325 mg by mouth at bedtime.  Current Outpatient Medications (Other):    Blood Glucose Monitoring Suppl (ONE TOUCH ULTRA 2) w/Device KIT, Check blood sugar 1 time a day-DX-E11.9   Cholecalciferol (VITAMIN D PO), Take 1 tablet by mouth daily.    finasteride (PROSCAR) 5 MG tablet, TAKE 1 TABLET BY MOUTH DAILY FOR PROSTATE   gabapentin (NEURONTIN) 300 MG capsule, Take 300 mg by mouth daily.   Lancets (ONETOUCH  DELICA PLUS [id number]) MISC, CHECK BLOOD SUGAR ONCE A DAY.   Lancets Misc. (ONE TOUCH SURESOFT) MISC, Check blood sugar 1 time a day-DX-E11.91   Magnesium 250 MG TABS, Take 250 mg by mouth daily.    Multiple Vitamin (MULTIVITAMIN) capsule, Take 1 capsule by mouth daily.   NONFORMULARY OR COMPOUNDED ITEM, Please dispense a Rolling Walker.  Dx:R26.81 & G63   ONETOUCH ULTRA test strip, CHECK BLOOD SUGAR ONCE A DAY.   potassium chloride SA (KLOR-CON Earl) 20 MEQ tablet, Take 1 tablet (20 mEq total) by mouth daily.   triamcinolone cream (KENALOG) 0.1 %, Apply 1 Application topically as needed.   trolamine salicylate (ASPERCREME) 10 % cream, Apply 1 application topically daily as needed for muscle pain.   vitamin C (ASCORBIC ACID) 500 MG tablet, Take 500 mg by mouth daily.  Allergies: Allergies  Allergen Reactions   Penicillins Other (See Comments)    Did it involve swelling of the face/tongue/throat, SOB, or low BP? Unknown Did it involve sudden or severe rash/hives, skin peeling, or any reaction on the inside of your mouth or nose? Unknown Did you need to seek medical attention at a hospital or doctor's office? No When did it last happen?    Over 39 Years Ago   If all above answers are "NO", may proceed with cephalosporin use.      Current Problems (verified) has Paroxysmal atrial fibrillation ([place]); MGUS (monoclonal gammopathy of unknown significance); Essential hypertension; Hyperlipidemia associated with type 2 diabetes mellitus ([place]); Type 2 diabetes mellitus with stage 2 chronic kidney disease, without long-term current use of insulin (HCC); BPH (benign prostatic hyperplasia); Medication management; Vitamin D deficiency; Hypothyroidism; History of CVA with residual deficit; Ataxia, post-stroke; Chronic anticoagulation; CKD stage 2 due to type 2 diabetes mellitus ([place]); Late effect of cerebrovascular accident (CVA); Acquired thrombophilia ([place]); Abdominal aortic atherosclerosis ([place]) by Abd  CTscvn  2016; Neurologic gait disorder; Dizziness; History of loop recorder; Diabetic 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 ([place]); Depressed mood; Pain due to  onychomycosis of toenail of left foot; Iron deficiency anemia; Acute on chronic congestive heart failure (HCC); NICM (nonischemic cardiomyopathy) ([place]); and Chronic systolic heart failure ([place]) on their problem list.   Surgical: He  has a past surgical history that includes Inguinal hernia repair (Bilateral); TEE without cardioversion (N/A, 07/28/2017); LOOP RECORDER INSERTION (N/A, 07/28/2017); Colonoscopy; Circumcision (N/A, 06/11/2019); and RIGHT/LEFT HEART CATH AND CORONARY ANGIOGRAPHY (N/A, 10/07/2021). Family His family history includes Colon cancer in his father; Diabetes in his father; Sickle cell anemia in his mother; Stomach cancer in his father. Social history  He reports that he has never smoked. He has never used smokeless tobacco. He reports that he does not drink alcohol and does not use drugs.   Review of Systems  Constitutional:  Negative for malaise/fatigue and weight loss.  HENT:  Negative for hearing loss and tinnitus.   Eyes:  Negative for blurred vision and double vision.  Respiratory:  Negative for cough, sputum production, shortness of breath and wheezing.   Cardiovascular:  Negative for chest pain, palpitations, orthopnea, claudication, leg swelling and PND.  Gastrointestinal:  Negative for abdominal pain, blood in stool, constipation, diarrhea, heartburn, melena, nausea and vomiting.  Genitourinary: Negative.   Musculoskeletal:  Negative for falls, joint pain and myalgias.  Skin:  Negative for rash.  Neurological:  Negative for dizziness, tingling, sensory change, weakness and headaches.  Endo/Heme/Allergies:  Negative for polydipsia.  Psychiatric/Behavioral: Negative.  Negative for depression, memory loss, substance abuse and suicidal ideas. The patient is not nervous/anxious and does not have  insomnia.   All other systems reviewed and are negative.    Objective:   There were no vitals filed for this visit.  There is no height or weight on file to calculate BMI.  General Appearance: Well nourished, in no apparent distress. Eyes: PERRLA, EOMs, conjunctiva no swelling or erythema Sinuses: No Frontal/maxillary tenderness ENT/Mouth: Ext aud canals clear, TMs without erythema, bulging. No erythema, swelling, or exudate on post pharynx.  Tonsils not swollen or erythematous. Hearing normal.  Neck: Supple, thyroid normal.  Respiratory: Respiratory effort normal, BS equal bilaterally without rales, rhonchi, wheezing or stridor.  Cardio: RRR with no MRGs. Brisk peripheral pulses with scant edema, wearing compression hose. Abdomen: Soft, + BS.  Non tender, no guarding, rebound, hernias, masses. Lymphatics: Non tender without lymphadenopathy.  Musculoskeletal: Full ROM, 5/5 strength, slow steady gait with cane Skin: Warm, dry without rashes, lesions, ecchymosis.  Neuro: Cranial nerves intact. No cerebellar symptoms. Sensation intact to monofilament in bil feet.  Psych: Awake and oriented X 3, intermittently tearful affect, Insight and Judgment appropriate.   Earl Rossetti, NP 1:07 PM [facility]

## 2022-12-14 ENCOUNTER — Encounter: Payer: Self-pay | Admitting: Nurse Practitioner

## 2022-12-14 ENCOUNTER — Encounter: Payer: Self-pay | Admitting: Neurology

## 2022-12-14 ENCOUNTER — Other Ambulatory Visit (HOSPITAL_COMMUNITY): Payer: Self-pay

## 2022-12-14 ENCOUNTER — Ambulatory Visit (INDEPENDENT_AMBULATORY_CARE_PROVIDER_SITE_OTHER): Payer: Medicare Other | Admitting: Nurse Practitioner

## 2022-12-14 ENCOUNTER — Ambulatory Visit: Payer: Medicare Other | Admitting: Internal Medicine

## 2022-12-14 ENCOUNTER — Ambulatory Visit: Payer: Medicare Other | Admitting: Neurology

## 2022-12-14 VITALS — BP 102/65 | HR 67 | Ht 71.0 in | Wt 159.0 lb

## 2022-12-14 VITALS — BP 102/54 | HR 75 | Temp 98.1°F | Ht 71.0 in | Wt 160.8 lb

## 2022-12-14 DIAGNOSIS — I509 Heart failure, unspecified: Secondary | ICD-10-CM | POA: Diagnosis not present

## 2022-12-14 DIAGNOSIS — E039 Hypothyroidism, unspecified: Secondary | ICD-10-CM | POA: Diagnosis not present

## 2022-12-14 DIAGNOSIS — I43 Cardiomyopathy in diseases classified elsewhere: Secondary | ICD-10-CM | POA: Diagnosis not present

## 2022-12-14 DIAGNOSIS — I6930 Unspecified sequelae of cerebral infarction: Secondary | ICD-10-CM

## 2022-12-14 DIAGNOSIS — Z8673 Personal history of transient ischemic attack (TIA), and cerebral infarction without residual deficits: Secondary | ICD-10-CM | POA: Diagnosis not present

## 2022-12-14 DIAGNOSIS — I10 Essential (primary) hypertension: Secondary | ICD-10-CM | POA: Diagnosis not present

## 2022-12-14 DIAGNOSIS — D472 Monoclonal gammopathy: Secondary | ICD-10-CM | POA: Diagnosis not present

## 2022-12-14 DIAGNOSIS — L97211 Non-pressure chronic ulcer of right calf limited to breakdown of skin: Secondary | ICD-10-CM

## 2022-12-14 DIAGNOSIS — E46 Unspecified protein-calorie malnutrition: Secondary | ICD-10-CM

## 2022-12-14 DIAGNOSIS — R1310 Dysphagia, unspecified: Secondary | ICD-10-CM | POA: Diagnosis not present

## 2022-12-14 DIAGNOSIS — N182 Chronic kidney disease, stage 2 (mild): Secondary | ICD-10-CM | POA: Diagnosis not present

## 2022-12-14 DIAGNOSIS — E8809 Other disorders of plasma-protein metabolism, not elsewhere classified: Secondary | ICD-10-CM | POA: Diagnosis not present

## 2022-12-14 DIAGNOSIS — I5032 Chronic diastolic (congestive) heart failure: Secondary | ICD-10-CM

## 2022-12-14 DIAGNOSIS — E1169 Type 2 diabetes mellitus with other specified complication: Secondary | ICD-10-CM | POA: Diagnosis not present

## 2022-12-14 DIAGNOSIS — Z79899 Other long term (current) drug therapy: Secondary | ICD-10-CM

## 2022-12-14 DIAGNOSIS — I480 Paroxysmal atrial fibrillation: Secondary | ICD-10-CM

## 2022-12-14 DIAGNOSIS — R2681 Unsteadiness on feet: Secondary | ICD-10-CM | POA: Diagnosis not present

## 2022-12-14 DIAGNOSIS — Z0001 Encounter for general adult medical examination with abnormal findings: Secondary | ICD-10-CM

## 2022-12-14 DIAGNOSIS — E854 Organ-limited amyloidosis: Secondary | ICD-10-CM | POA: Diagnosis not present

## 2022-12-14 DIAGNOSIS — G63 Polyneuropathy in diseases classified elsewhere: Secondary | ICD-10-CM

## 2022-12-14 DIAGNOSIS — E1122 Type 2 diabetes mellitus with diabetic chronic kidney disease: Secondary | ICD-10-CM | POA: Diagnosis not present

## 2022-12-14 DIAGNOSIS — E559 Vitamin D deficiency, unspecified: Secondary | ICD-10-CM | POA: Diagnosis not present

## 2022-12-14 DIAGNOSIS — R6889 Other general symptoms and signs: Secondary | ICD-10-CM | POA: Diagnosis not present

## 2022-12-14 DIAGNOSIS — I5022 Chronic systolic (congestive) heart failure: Secondary | ICD-10-CM | POA: Diagnosis not present

## 2022-12-14 DIAGNOSIS — N481 Balanitis: Secondary | ICD-10-CM

## 2022-12-14 DIAGNOSIS — I872 Venous insufficiency (chronic) (peripheral): Secondary | ICD-10-CM

## 2022-12-14 DIAGNOSIS — E785 Hyperlipidemia, unspecified: Secondary | ICD-10-CM | POA: Diagnosis not present

## 2022-12-14 DIAGNOSIS — I700 Atherosclerosis of aorta: Secondary | ICD-10-CM

## 2022-12-14 DIAGNOSIS — I69393 Ataxia following cerebral infarction: Secondary | ICD-10-CM | POA: Diagnosis not present

## 2022-12-14 MED ORDER — CLOTRIMAZOLE-BETAMETHASONE 1-0.05 % EX CREA: TOPICAL_CREAM | CUTANEOUS | 1 refills | Status: DC

## 2022-12-14 MED ORDER — TERBINAFINE HCL 250 MG PO TABS: 250.0000 mg | ORAL_TABLET | Freq: Every day | ORAL | 0 refills | Status: AC

## 2022-12-14 MED ORDER — BACLOFEN 10 MG PO TABS: ORAL_TABLET | ORAL | 5 refills | Status: DC

## 2022-12-14 MED ORDER — TORSEMIDE 20 MG PO TABS: 20.0000 mg | ORAL_TABLET | Freq: Two times a day (BID) | ORAL | 1 refills | Status: DC

## 2022-12-14 NOTE — Patient Instructions (Addendum)
Swallow evaluation  Refer home physical therapy  Start baclofen '5mg'$  (half tablet) at bedtime x 1 week, then increase to half tablet twice daily.   Return to clinic in 4 months

## 2022-12-14 NOTE — Patient Instructions (Signed)
Betadine sugar ointment daily or every other day Stop Furosemide and begin  20 mg Torsemide twice a day Follow up in 1 week to recheck wound and draw labs Use Lamisil 1 pill daily x 14 days for the penis and can use Lotrisone to penis twice a day.

## 2022-12-14 NOTE — Progress Notes (Signed)
Follow-up Visit   Date: 12/14/2022    Trai Ells MRN: [id number] DOB: 1940/12/13    Julus Kelley is a 82 y.o. 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  The patient was accompanied to the clinic by daughter who also provides collateral information.    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  He is having greater difficulty with fine motor movements and grasping objects because of numbness and weakness.  - Continue Amvuttra - Continue home PT/OT - Hand assist devices discussed            - He is very high risk for falls.  Fall precautions discussed, he is compliant with always using a walker                 2.  Gait difficulty with right leg spasticity from prior left cerebellar stroke with residual hemiataxia - Start baclofen '5mg'$  whs x 1 week, then increase to '5mg'$  BID.  OK to titrate further as tolerable  3.  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  He was referred for neurological evaluation of his neuropathy.  Patient has a long history of neuropathy, which initially manifested with burning pain in the feet.  However, over the years, he no longer has any pain.  He takes gabapentin '600mg'$  at bedtime.   Now, he has predominately numbness in the feet and lower legs.  He has  weakness of the right leg from stroke.  He has poor balance and has been using a rigid walker for the past 1.5 year.  He would like to use a cane, but it's too unsteady.  He uses a transport chair when outside of the home.  He had one fall this year. Diabetes is well-controlled.    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     He lives in a one level home and daughter is the primary care giver.    Nonsmoker and never drank alcohol.  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    UPDATE 12/14/2022:  He is here for follow-up visit accompanied by daughter.  Daughter reports she has noticed that he tends to cough with eating and is concerned of risk of aspiration.  He has not had any choking spells.  She also is concerned that he tends to drag the right leg when walking and is very unsteady.  He has completed home PT which has helped and would like it extended.  He is having ongoing difficulty with holding objects and tends to drop things frequently.    Medications:  Current Outpatient Medications on File Prior to Visit  Medication Sig Dispense Refill   AMVUTTRA 25 MG/0.5ML syringe Inject into the skin.     atorvastatin (LIPITOR) 40 MG tablet TAKE 1 TABLET BY MOUTH  DAILY FOR CHOLESTEROL 90 tablet 3   Blood Glucose Monitoring Suppl (ONE TOUCH ULTRA 2) w/Device KIT  Check blood sugar 1 time a day-DX-E11.9 1 kit 0   Cholecalciferol (VITAMIN D PO) Take 1 tablet by mouth daily.      Cyanocobalamin (B-12) 50 MCG TABS Take 50 mcg by mouth daily.     ELIQUIS 5 MG TABS tablet TAKE 1 TABLET BY MOUTH  TWICE DAILY TO PREVENT  BLOOD CLOTS 180 tablet 3   ferrous sulfate 325 (65 FE) MG tablet Take 325 mg by mouth at bedtime.     finasteride (PROSCAR) 5 MG tablet TAKE 1 TABLET BY MOUTH DAILY FOR PROSTATE 70 tablet 4   furosemide (LASIX) 40 MG tablet Take 2 tablets (80 mg total) by mouth 2 (two) times daily. 120 tablet 6    glipiZIDE (GLUCOTROL) 5 MG tablet Patient takes 0.5 tablet by mouth twice a day.     Lancets (ONETOUCH DELICA PLUS [id number]) MISC CHECK BLOOD SUGAR ONCE A DAY. 100 each 3   Lancets Misc. (ONE TOUCH SURESOFT) MISC Check blood sugar 1 time a day-DX-E11.91 1 each 0   levothyroxine (SYNTHROID) 50 MCG tablet Take  1 tablet  Daily  on an empty stomach with only water for 30 minutes & no Antacid meds, Calcium or Magnesium for 4 hours & avoid Biotin                                          /                              TAKE                      BY                          MOUTH 90 tablet 3   Magnesium 250 MG TABS Take 250 mg by mouth daily.      metFORMIN (GLUCOPHAGE-XR) 500 MG 24 hr tablet TAKE 2 TABLETS BY MOUTH TWICE  DAILY WITH MEALS FOR DIABETES 280 tablet 4   metoprolol succinate (TOPROL-XL) 50 MG 24 hr tablet TAKE 1 TABLET BY MOUTH AT  BEDTIME 90 tablet 3   Multiple Vitamin (MULTIVITAMIN) capsule Take 1 capsule by mouth daily.     NONFORMULARY OR COMPOUNDED ITEM Please dispense a Rolling Walker.  Dx:R26.81 & G63 1 each 0   ONETOUCH ULTRA test strip CHECK BLOOD SUGAR ONCE A DAY. 100 strip 3   potassium chloride SA (KLOR-CON M) 20 MEQ tablet Take 1 tablet (20 mEq total) by mouth daily. 90 tablet 3   sacubitril-valsartan (ENTRESTO) 24-26 MG Take 1 tablet by mouth 2 (two) times daily. 180 tablet 3   spironolactone (ALDACTONE) 25 MG tablet Take 1 tablet (25 mg total) by mouth at bedtime. 90 tablet 3   Tafamidis (VYNDAMAX) 61 MG CAPS Take 1 capsule by mouth daily. 30 capsule 11   triamcinolone cream (KENALOG) 0.1 % Apply 1 Application topically as needed.     trolamine salicylate (ASPERCREME) 10 % cream Apply 1 application topically daily as needed for muscle pain.     VITAMIN A PO Take by mouth. 3000 mg     vitamin C (ASCORBIC ACID) 500 MG tablet Take 500 mg by mouth daily.     gabapentin (NEURONTIN) 300 MG capsule Take 300 mg by mouth daily. (Patient not taking: Reported on  12/14/2022)     No  current facility-administered medications on file prior to visit.    Allergies:  Allergies  Allergen Reactions   Penicillins Other (See Comments)    Did it involve swelling of the face/tongue/throat, SOB, or low BP? Unknown Did it involve sudden or severe rash/hives, skin peeling, or any reaction on the inside of your mouth or nose? Unknown Did you need to seek medical attention at a hospital or doctor's office? No When did it last happen?    Over 38 Years Ago   If all above answers are "NO", may proceed with cephalosporin use.      Vital Signs:  BP 102/65   Pulse 67   Ht '5\' 11"'$  (1.803 m)   Wt 159 lb (72.1 kg)   SpO2 99%   BMI 22.18 kg/m    Neurological Exam: MENTAL STATUS including orientation to time, place, person, recent and remote memory, attention span and concentration, language, and fund of knowledge is normal.  Speech is not dysarthric.  CRANIAL NERVES:    Pupils equal round and reactive to light.  Normal conjugate, extra-ocular eye movements in all directions of gaze.  No ptosis.  Face is symmetric. Palate elevates symmetrically.  Tongue is midline.  MOTOR:  Severe bilateral ABP atrophy, no fasciculations or abnormal movements.  Motor strength is 5/5 RUE proximally, 3/5 distally, RLE 4/5, LUE and LLE 5/5.  No pronator drift.  spasticity in the RLE.  MSRs:                                           Right        Left brachioradialis 3+  3+  biceps 3+  3+  triceps 3+  3+  patellar 3+  3+  ankle jerk 3+  3+   SENSORY:  Vibration, temperature and pin prick absent below the ankles and reduced at the knees.     COORDINATION/GAIT: Normfal finger-to- nose-finger.  Intact rapid alternating movements bilaterally. Gait  not tested.  Patient arrived in wheelchair.    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 A superimposed severe left carpal tunnel syndrome is also present.   MRI cervical spine wo contrast 09/06/2022: 1. Normal appearance of the cervical cord. 2. Ordinary cervical spine degeneration with foraminal impingement bilaterally at C3-4, C4-5 and on the right at C6-7.   Thank you for allowing me to participate in patient's care.  If I can answer any additional questions, I would be pleased to do so.    Sincerely,    Karim Aiello K. Posey Pronto, DO

## 2022-12-14 NOTE — Progress Notes (Unsigned)
wound

## 2022-12-15 ENCOUNTER — Other Ambulatory Visit (HOSPITAL_COMMUNITY): Payer: Self-pay

## 2022-12-15 DIAGNOSIS — R1310 Dysphagia, unspecified: Secondary | ICD-10-CM

## 2022-12-15 LAB — COMPLETE METABOLIC PANEL WITH GFR
AG Ratio: 1.7 (calc) (ref 1.0–2.5)
ALT: 18 U/L (ref 9–46)
AST: 18 U/L (ref 10–35)
Albumin: 3.9 g/dL (ref 3.6–5.1)
Alkaline phosphatase (APISO): 50 U/L (ref 35–144)
BUN/Creatinine Ratio: 30 (calc) — ABNORMAL HIGH (ref 6–22)
BUN: 34 mg/dL — ABNORMAL HIGH (ref 7–25)
CO2: 28 mmol/L (ref 20–32)
Calcium: 9.3 mg/dL (ref 8.6–10.3)
Chloride: 105 mmol/L (ref 98–110)
Creat: 1.13 mg/dL (ref 0.70–1.22)
Globulin: 2.3 g/dL (calc) (ref 1.9–3.7)
Glucose, Bld: 68 mg/dL (ref 65–99)
Potassium: 4.5 mmol/L (ref 3.5–5.3)
Sodium: 141 mmol/L (ref 135–146)
Total Bilirubin: 0.4 mg/dL (ref 0.2–1.2)
Total Protein: 6.2 g/dL (ref 6.1–8.1)
eGFR: 65 mL/min/{1.73_m2} (ref >=60)

## 2022-12-15 LAB — CBC WITH DIFFERENTIAL/PLATELET
Absolute Monocytes: 620 cells/uL (ref 200–950)
Basophils Absolute: 60 cells/uL (ref 0–200)
Basophils Relative: 1.2 %
Eosinophils Absolute: 80 cells/uL (ref 15–500)
Eosinophils Relative: 1.6 %
HCT: 33.4 % — ABNORMAL LOW (ref 38.5–50.0)
Hemoglobin: 11.6 g/dL — ABNORMAL LOW (ref 13.2–17.1)
Lymphs Abs: 1755 cells/uL (ref 850–3900)
MCH: 30.9 pg (ref 27.0–33.0)
MCHC: 34.7 g/dL (ref 32.0–36.0)
MCV: 89.1 fL (ref 80.0–100.0)
MPV: 10.5 fL (ref 7.5–12.5)
Monocytes Relative: 12.4 %
Neutro Abs: 2485 cells/uL (ref 1500–7800)
Neutrophils Relative %: 49.7 %
Platelets: 218 10*3/uL (ref 140–400)
RBC: 3.75 10*6/uL — ABNORMAL LOW (ref 4.20–5.80)
RDW: 12.7 % (ref 11.0–15.0)
Total Lymphocyte: 35.1 %
WBC: 5 10*3/uL (ref 3.8–10.8)

## 2022-12-15 LAB — LIPID PANEL
Cholesterol: 118 mg/dL (ref <200)
HDL: 56 mg/dL (ref >=40)
LDL Cholesterol (Calc): 49 mg/dL (calc)
Non-HDL Cholesterol (Calc): 62 mg/dL (calc) (ref <130)
Total CHOL/HDL Ratio: 2.1 (calc) (ref <5.0)
Triglycerides: 58 mg/dL (ref <150)

## 2022-12-15 LAB — HEMOGLOBIN A1C
Hgb A1c MFr Bld: 6.3 % of total Hgb — ABNORMAL HIGH (ref <5.7)
Mean Plasma Glucose: 134 mg/dL
eAG (mmol/L): 7.4 mmol/L

## 2022-12-15 LAB — TSH: TSH: 1 mIU/L (ref 0.40–4.50)

## 2022-12-16 ENCOUNTER — Telehealth: Payer: Self-pay

## 2022-12-16 ENCOUNTER — Other Ambulatory Visit (HOSPITAL_COMMUNITY): Payer: Self-pay

## 2022-12-16 NOTE — Telephone Encounter (Signed)
Called patient and patients daughter Para and left a detailed message informing them that insurance is not going to allow [place] to extend or pay for anymore home PT at this time. I did let them know to call their insurance if they wanted to discuss that with them. Left my contact information incase they had any questions.

## 2022-12-19 ENCOUNTER — Other Ambulatory Visit (HOSPITAL_COMMUNITY): Payer: Self-pay

## 2022-12-19 ENCOUNTER — Other Ambulatory Visit: Payer: Self-pay | Admitting: Nurse Practitioner

## 2022-12-21 ENCOUNTER — Encounter: Payer: Self-pay | Admitting: Internal Medicine

## 2022-12-21 ENCOUNTER — Ambulatory Visit (INDEPENDENT_AMBULATORY_CARE_PROVIDER_SITE_OTHER): Payer: Medicare Other | Admitting: Internal Medicine

## 2022-12-21 VITALS — BP 100/56 | HR 69 | Temp 97.9°F | Resp 16 | Ht 71.0 in | Wt 157.8 lb

## 2022-12-21 DIAGNOSIS — I872 Venous insufficiency (chronic) (peripheral): Secondary | ICD-10-CM | POA: Diagnosis not present

## 2022-12-21 DIAGNOSIS — L97211 Non-pressure chronic ulcer of right calf limited to breakdown of skin: Secondary | ICD-10-CM

## 2022-12-21 DIAGNOSIS — N182 Chronic kidney disease, stage 2 (mild): Secondary | ICD-10-CM | POA: Diagnosis not present

## 2022-12-21 DIAGNOSIS — E1122 Type 2 diabetes mellitus with diabetic chronic kidney disease: Secondary | ICD-10-CM

## 2022-12-21 NOTE — Progress Notes (Signed)
Future Appointments  Date Time Provider Department  12/21/2022  3:30 PM [name], MD [facility]  12/29/2022  3:00 PM Armbruster, Carlota Raspberry, MD [facility]  01/03/2023  3:30 PM Debbe Mounts, PhD [facility]  02/22/2023  3:30 PM Marzetta Board, DPM [facility]  03/15/2023  2:30 PM [name], MD [facility]  04/25/2023  2:30 PM Alda Berthold, DO [facility]  09/20/2023  3:00 PM [name], MD [facility]  03/14/2024  2:00 PM [name], MD [facility]    History of Present Illness:  This very nice 82 y.o. [name]  with HTN, ASCVD/Hx CVA, HLD, T2_NIDDM  and Vitamin D Deficiency  returns for   F/u of a venous stasis ulcer of the Rt calf.     Current Outpatient Medications on File Prior to Visit  Medication Sig   AMVUTTRA 25 MG/0.5ML syringe Inject into the skin.   atorvastatin  40 MG tablet TAKE 1 TABLET BY MOUTH  DAILY FOR CHOLESTEROL   baclofen (LIORESAL) 10 MG tablet Start '5mg'$  (half tablet) at bedtime x 1 week, then increase to half tablet twice daily.   VITAMIN D  Take 1 tablet  daily.    clotrimazole-betamethasone  Apply to affected area 2 times daily   B-12  50 MCG TABS Take 50 mcg daily.   ELIQUIS 5 MG TABS tablet TAKE 1 TABLET  TWICE DAILY    ferrous sulfate 325 (65 FE) MG tablet Take 325 mgat bedtime.   finasteride  5 MG tablet TAKE 1 TABLET DAILY FOR PROSTATE   glipiZIDE  5 MG tablet Patient takes 0.5 tablet twice a day.   levothyroxine 50 MCG tablet Take  1 tablet  Daily    Magnesium 250 MG TABS Take  daily.    metFORMIN -XR) 500 MG  TAKE 2 TABLETS TWICE  DAILY WITH MEALS    metoprolol succinate-XL) 50 MG 2 TAKE 1 TABLET AT  BEDTIME   Multiple Vitamin  Take 1 capsule daily.   potassium chloride  20 MEQ tablet Take 1 tablet ( daily.   sacubitril-valsartan (ENTRESTO) 24-26 MG Take 1 tablet by mouth 2 (two) times daily.   spironolactone 25 MG tablet Take 1 tablet (25 mg total) by mouth at bedtime.   Tafamidis (VYNDAMAX) 61 MG CAPS Take 1 capsule by mouth  daily.   terbinafine (LAMISIL) 250 MG tablet Take 1 tablet (250 mg total) by mouth daily for 14 days.   torsemide 20 MG tablet Take 1 tablet (20 mg total) by mouth 2 (two) times daily.   triamcinolone cream  0.1 % Apply 1 Application topically as needed.   ASPERCREME 10 % cream Apply 1 application topically daily as needed for muscle pain.   VITAMIN A  Take by mouth. 3000 mg   vitamin C (ASCORBIC ACID) 500 MG tablet Take 500 mg by mouth daily.     Allergies  Allergen Reactions   Penicillins Other (See Comments)     Problem list He has Paroxysmal atrial fibrillation (HCC); MGUS (monoclonal gammopathy of unknown significance); Essential hypertension; Hyperlipidemia associated with type 2 diabetes mellitus ([place]); Type 2 diabetes mellitus with stage 2 chronic kidney disease, without long-term current use of insulin (HCC); BPH (benign prostatic hyperplasia); Medication management; Vitamin D deficiency; Hypothyroidism; History of CVA with residual deficit; Ataxia, post-stroke; Chronic anticoagulation; CKD stage 2 due to type 2 diabetes mellitus ([place]); Late effect of cerebrovascular accident (CVA); Acquired thrombophilia ([place]); Abdominal aortic atherosclerosis ([place]) by Abd CTscvn  2016; Neurologic gait disorder; Dizziness; History  of loop recorder; Diabetic 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 ([place]); Depressed mood; Pain due to onychomycosis of toenail of left foot; Iron deficiency anemia; Acute on chronic congestive heart failure (HCC); NICM (nonischemic cardiomyopathy) ([place]); and Chronic systolic heart failure ([place]) on their problem list.   Observations/Objective:  BP (!) 100/56   Pulse 69   Temp 97.9 F (36.6 C)   Resp 16   Ht '5\' 11"'$  (1.803 m)   Wt 157 lb 12.8 oz (71.6 kg)   SpO2 98%   BMI 22.01 kg/m   HEENT - WNL. Neck - supple.  Chest - Clear equal BS. Cor - Nl HS. RRR w/o sig MGR. PP 1(+). No edema. MS- FROM w/o deformities.  Gait Nl. Neuro -  Nl w/o focal  abnormalities. Skin - Superficial denuded area of  Rt Calf appears healing well w/o sigs of deep ulceration. Wound cleaned with H2O2 rinse & Betadine sugar  compound re-applied & covered wit Saran wrap & secured with tape . Patient daughter advised to change dressing in 3-4 days and may not require further dressing changes as appears re-epithelialization is intact.   Assessment and Plan:  1. Venous stasis ulcer of right calf limited to breakdown of skin without varicose veins (HCC)   2. Type 2 diabetes mellitus with stage 2 chronic kidney disease, without long-term current use of insulin (HCC)    Follow Up Instructions:        I discussed the assessment and treatment plan with the patient. The patient was provided an opportunity to ask questions and all were answered. The patient agreed with the plan and demonstrated an understanding of the instructions.       The patient was advised to call back or seek an in-person evaluation if the symptoms worsen or if the condition fails to improve as anticipated.    Kirtland Bouchard, MD

## 2022-12-26 ENCOUNTER — Encounter: Payer: Self-pay | Admitting: Internal Medicine

## 2022-12-26 ENCOUNTER — Encounter (HOSPITAL_COMMUNITY): Payer: Self-pay | Admitting: Cardiology

## 2022-12-26 ENCOUNTER — Encounter: Payer: Self-pay | Admitting: Nurse Practitioner

## 2022-12-27 ENCOUNTER — Other Ambulatory Visit (HOSPITAL_COMMUNITY): Payer: Self-pay

## 2022-12-27 ENCOUNTER — Other Ambulatory Visit: Payer: Self-pay | Admitting: Nurse Practitioner

## 2022-12-27 DIAGNOSIS — I872 Venous insufficiency (chronic) (peripheral): Secondary | ICD-10-CM

## 2022-12-27 NOTE — Telephone Encounter (Signed)
This is Geremiah's daughters messages

## 2022-12-28 ENCOUNTER — Ambulatory Visit (HOSPITAL_COMMUNITY)
Admission: RE | Admit: 2022-12-28 | Discharge: 2022-12-28 | Disposition: A | Discharge status: Home / Self Care | Payer: Medicare Other | Source: Ambulatory Visit | Attending: Internal Medicine | Admitting: Internal Medicine

## 2022-12-28 ENCOUNTER — Other Ambulatory Visit: Payer: Medicare Other

## 2022-12-28 DIAGNOSIS — I10 Essential (primary) hypertension: Secondary | ICD-10-CM | POA: Diagnosis not present

## 2022-12-28 DIAGNOSIS — Z8673 Personal history of transient ischemic attack (TIA), and cerebral infarction without residual deficits: Secondary | ICD-10-CM | POA: Insufficient documentation

## 2022-12-28 DIAGNOSIS — T17320A Food in larynx causing asphyxiation, initial encounter: Secondary | ICD-10-CM | POA: Diagnosis not present

## 2022-12-28 DIAGNOSIS — R531 Weakness: Secondary | ICD-10-CM | POA: Insufficient documentation

## 2022-12-28 DIAGNOSIS — E119 Type 2 diabetes mellitus without complications: Secondary | ICD-10-CM | POA: Diagnosis not present

## 2022-12-28 DIAGNOSIS — K219 Gastro-esophageal reflux disease without esophagitis: Secondary | ICD-10-CM | POA: Insufficient documentation

## 2022-12-28 DIAGNOSIS — K117 Disturbances of salivary secretion: Secondary | ICD-10-CM | POA: Diagnosis not present

## 2022-12-28 DIAGNOSIS — R1310 Dysphagia, unspecified: Secondary | ICD-10-CM | POA: Diagnosis not present

## 2022-12-28 DIAGNOSIS — N400 Enlarged prostate without lower urinary tract symptoms: Secondary | ICD-10-CM | POA: Diagnosis not present

## 2022-12-28 DIAGNOSIS — R1312 Dysphagia, oropharyngeal phase: Secondary | ICD-10-CM | POA: Insufficient documentation

## 2022-12-28 DIAGNOSIS — R059 Cough, unspecified: Secondary | ICD-10-CM | POA: Insufficient documentation

## 2022-12-28 DIAGNOSIS — E039 Hypothyroidism, unspecified: Secondary | ICD-10-CM | POA: Insufficient documentation

## 2022-12-28 DIAGNOSIS — E785 Hyperlipidemia, unspecified: Secondary | ICD-10-CM | POA: Diagnosis not present

## 2022-12-28 DIAGNOSIS — R6339 Other feeding difficulties: Secondary | ICD-10-CM | POA: Diagnosis not present

## 2022-12-29 ENCOUNTER — Other Ambulatory Visit: Payer: Medicare Other

## 2022-12-29 ENCOUNTER — Encounter: Payer: Self-pay | Admitting: Gastroenterology

## 2022-12-29 ENCOUNTER — Ambulatory Visit: Payer: Medicare Other | Admitting: Gastroenterology

## 2022-12-29 ENCOUNTER — Other Ambulatory Visit (INDEPENDENT_AMBULATORY_CARE_PROVIDER_SITE_OTHER): Payer: Medicare Other

## 2022-12-29 VITALS — BP 102/60 | HR 68 | Ht 71.0 in | Wt 157.0 lb

## 2022-12-29 DIAGNOSIS — R195 Other fecal abnormalities: Secondary | ICD-10-CM | POA: Diagnosis not present

## 2022-12-29 DIAGNOSIS — Z7901 Long term (current) use of anticoagulants: Secondary | ICD-10-CM

## 2022-12-29 DIAGNOSIS — Z86010 Personal history of colonic polyps: Secondary | ICD-10-CM

## 2022-12-29 DIAGNOSIS — D649 Anemia, unspecified: Secondary | ICD-10-CM

## 2022-12-29 LAB — IBC + FERRITIN
Ferritin: 128.7 ng/mL (ref 22.0–322.0)
Iron: 59 ug/dL (ref 42–165)
Saturation Ratios: 21.4 % (ref 20.0–50.0)
TIBC: 275.8 ug/dL (ref 250.0–450.0)
Transferrin: 197 mg/dL — ABNORMAL LOW (ref 212.0–360.0)

## 2022-12-29 NOTE — Patient Instructions (Addendum)
If you are age 82 or older, your body mass index should be between 23-30. Your Body mass index is 21.9 kg/m. If this is out of the aforementioned range listed, please consider follow up with your Primary Care Provider.  If you are age 23 or younger, your body mass index should be between 19-25. Your Body mass index is 21.9 kg/m. If this is out of the aformentioned range listed, please consider follow up with your Primary Care Provider.   ________________________________________________________   Please go to the lab in the basement of our building to have lab work done as you leave today. Hit "B" for basement when you get on the elevator.  When the doors open the lab is on your left.  We will call you with the results. Thank you.    Thank you for entrusting me with your care and for choosing [facility], Dr. [place]

## 2022-12-29 NOTE — Progress Notes (Signed)
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 Melford Aase for heme positive stool. Last seen 04/2019.  Patient states he had stool test in December for routine colon cancer screening, was FOBT positive.  He has no overt blood in the stools.  Denies any problems with his bowels.  He generally has been feeling pretty well without complaints in regards to his abdomen.  No abdominal pains.  He had a very mild iron deficiency back in 2022 on chronic iron but he does have a chronic stable anemia over years.  He is accompanied by his daughter today.  He has been having some coughing with swallowing, recently had a modified barium swallow which shows evidence of some aspiration.  He denies any dysphagia at all that bothersome.  No heartburn.  He generally has been eating okay without problems other than occasional coughing.  He had an EGD with me a few years ago which showed some mild inflammation of his esophagus but no concerning pathology.  We discussed as below if he wanted to proceed with any endoscopic evaluation.  He denies any active cardiopulmonary symptoms now however he is hesitant to come off anticoagulation given his stroke risk and in general wants to avoid any invasive procedures at this point in his life.  Endoscopic history: Colonoscopy 09/07/2012 - 57m polyp removed - adenoma  EGD 05/21/19 - Esophagogastric landmarks identified.  - Z-line irregular, 40 cm from the incisors. Biopsied. - LA Grade B reflux esophagitis. - Normal stomach. Biopsied. - Normal duodenal bulb and second portion of the duodenum.  Colonoscopy 05/11/19:  - One 4 mm polyp in the cecum, removed with a cold snare. Resected and retrieved. - One diminutive polyp in the ascending colon, removed with a cold snare. Resected and retrieved. - One 3 mm polyp in the  sigmoid colon, removed with a cold snare. Resected and retrieved. - Diverticulosis in the transverse colon and in the left colon. - Internal hemorrhoids. - The examination was otherwise normal.  1. Surgical [P], gastric antrum and gastric body - CHRONIC INACTIVE GASTRITIS, MILD. - THERE IS NO EVIDENCE OF HELICOBACTER PYLORI, DYSPLASIA, OR MALIGNANCY. - SEE COMMENT. 2. Surgical [P], GE junction - GASTROESOPHAGEAL JUNCTION MUCOSA WITH MILD INFLAMMATION CONSISTENT WITH GASTROESOPHAGEAL REFLUX. - THERE IS NO EVIDENCE OF GOBLET CELL METAPLASIA, DYSPLASIA, OR MALIGNANCY. 3. Surgical [P], sigmoid, ascending and cecum, polyps (3) - TUBULAR ADENOMA(S). - HIGH GRADE DYSPLASIA IS NOT IDENTIFIED.  11/04/2022 - FOBT (+)  Chronic anemia - Hgb 11.6 - MCV 89  Cardiac MRI 12/20/21:EF 45-48%  Cardiac cath 10/07/21: - mild LAD 25% stenosis, normal EF  Past Medical History:  Diagnosis Date   Adenomatous colon polyp    Allergy    Anal fissure    Arthritis    BPH (benign prostatic hyperplasia)    Diabetes mellitus (HCC)    GERD (gastroesophageal reflux disease)    Hyperlipidemia    Hypertension    Hypothyroidism    Other testicular hypofunction    Stroke ([facility]      Past Surgical History:  Procedure Laterality Date   CIRCUMCISION N/A 06/11/2019   Procedure: CIRCUMCISION ADULT;  Surgeon: EFestus Aloe MD;  Location: [facility];  Service: Urology;  Laterality: N/A;   COLONOSCOPY     INGUINAL HERNIA REPAIR Bilateral    1968 and 2004   LOOP RECORDER INSERTION N/A 07/28/2017  Procedure: LOOP RECORDER INSERTION;  Surgeon: Deboraha Sprang, MD;  Location: [place] [facility];  Service: Cardiovascular;  Laterality: N/A;   RIGHT/LEFT HEART CATH AND CORONARY ANGIOGRAPHY N/A 10/07/2021   Procedure: RIGHT/LEFT HEART CATH AND CORONARY ANGIOGRAPHY;  Surgeon: Larey Dresser, MD;  Location: [place] [facility];  Service: Cardiovascular;  Laterality: N/A;   TEE WITHOUT CARDIOVERSION N/A 07/28/2017    Procedure: TRANSESOPHAGEAL ECHOCARDIOGRAM (TEE);  Surgeon: Sueanne Margarita, MD;  Location: [facility] ENDOSCOPY;  Service: Cardiovascular;  Laterality: N/A;   Family History  Problem Relation Age of Onset   Sickle cell anemia Mother    Diabetes Father    Stomach cancer Father    Colon cancer Father    CAD Neg Hx    Esophageal cancer Neg Hx    Rectal cancer Neg Hx    Social History   Tobacco Use   Smoking status: Never   Smokeless tobacco: Never  Vaping Use   Vaping Use: Never used  Substance Use Topics   Alcohol use: No   Drug use: No   Current Outpatient Medications  Medication Sig Dispense Refill   AMVUTTRA 25 MG/0.5ML syringe Inject into the skin.     atorvastatin (LIPITOR) 40 MG tablet TAKE 1 TABLET BY MOUTH  DAILY FOR CHOLESTEROL 90 tablet 3   baclofen (LIORESAL) 10 MG tablet Start '5mg'$  (half tablet) at bedtime x 1 week, then increase to half tablet twice daily. 30 each 5   Blood Glucose Monitoring Suppl (ONE TOUCH ULTRA 2) w/Device KIT Check blood sugar 1 time a day-DX-E11.9 1 kit 0   Cholecalciferol (VITAMIN D PO) Take 1 tablet by mouth daily.      clotrimazole-betamethasone (LOTRISONE) cream Apply to affected area 2 times daily 15 g 1   Cyanocobalamin (B-12) 50 MCG TABS Take 50 mcg by mouth daily.     ELIQUIS 5 MG TABS tablet TAKE 1 TABLET BY MOUTH  TWICE DAILY TO PREVENT  BLOOD CLOTS 180 tablet 3   ferrous sulfate 325 (65 FE) MG tablet Take 325 mg by mouth at bedtime.     finasteride (PROSCAR) 5 MG tablet TAKE 1 TABLET BY MOUTH DAILY FOR PROSTATE 70 tablet 4   glipiZIDE (GLUCOTROL) 5 MG tablet Patient takes 0.5 tablet by mouth twice a day.     Lancets (ONETOUCH DELICA PLUS [id number]) MISC CHECK BLOOD SUGAR ONCE A DAY. 100 each 3   Lancets Misc. (ONE TOUCH SURESOFT) MISC Check blood sugar 1 time a day-DX-E11.91 1 each 0   levothyroxine (SYNTHROID) 50 MCG tablet Take  1 tablet  Daily  on an empty stomach with only water for 30 minutes & no Antacid meds, Calcium or Magnesium for 4  hours & avoid Biotin                                          /                              TAKE                      BY                          MOUTH 90 tablet 3   Magnesium 250 MG TABS Take 250 mg  by mouth daily.      metFORMIN (GLUCOPHAGE-XR) 500 MG 24 hr tablet TAKE 2 TABLETS BY MOUTH TWICE  DAILY WITH MEALS FOR DIABETES 280 tablet 4   metoprolol succinate (TOPROL-XL) 50 MG 24 hr tablet TAKE 1 TABLET BY MOUTH AT  BEDTIME 90 tablet 3   Multiple Vitamin (MULTIVITAMIN) capsule Take 1 capsule by mouth daily.     NONFORMULARY OR COMPOUNDED ITEM Please dispense a Rolling Walker.  Dx:R26.81 & G63 1 each 0   ONETOUCH ULTRA test strip CHECK BLOOD SUGAR ONCE A DAY. 100 strip 3   potassium chloride SA (KLOR-CON M) 20 MEQ tablet Take 1 tablet (20 mEq total) by mouth daily. 90 tablet 3   sacubitril-valsartan (ENTRESTO) 24-26 MG Take 1 tablet by mouth 2 (two) times daily. 180 tablet 3   spironolactone (ALDACTONE) 25 MG tablet Take 1 tablet (25 mg total) by mouth at bedtime. 90 tablet 3   Tafamidis (VYNDAMAX) 61 MG CAPS Take 1 capsule by mouth daily. 30 capsule 11   torsemide (DEMADEX) 20 MG tablet Take 1 tablet (20 mg total) by mouth 2 (two) times daily. 60 tablet 1   triamcinolone cream (KENALOG) 0.1 % Apply 1 Application topically as needed.     trolamine salicylate (ASPERCREME) 10 % cream Apply 1 application topically daily as needed for muscle pain.     VITAMIN A PO Take by mouth. 3000 mg     vitamin C (ASCORBIC ACID) 500 MG tablet Take 500 mg by mouth daily.     No current facility-administered medications for this visit.   Allergies  Allergen Reactions   Penicillins Other (See Comments)    Did it involve swelling of the face/tongue/throat, SOB, or low BP? Unknown Did it involve sudden or severe rash/hives, skin peeling, or any reaction on the inside of your mouth or nose? Unknown Did you need to seek medical attention at a hospital or doctor's office? No When did it last happen?    Over 30  Years Ago   If all above answers are "NO", may proceed with cephalosporin use.       Review of Systems: All systems reviewed and negative except where noted in HPI.    DG SWALLOW FUNC OP MEDICARE SPEECH PATH  Result Date: 12/28/2022 Table formatting from the original result was not included. Modified Barium Swallow Study Patient Details Name: Earl [name] MRN: [id number] Date of Birth: [birth year] Today's Date: 12/28/2022 HPI/PMH: HPI: 82 yo male referred for repeat outpatient MBS to objectively assess swallow function and safety, and to identify least restrictive diet.  Pt has h/o dysphagia at least back to 2021, CVA 2018 with right sided weakness, CHF - on diuretic, GERD *he denies, anxiety, and DM.  Daughter, Earl [name], reports pt having increased coughing episodes which prompted her to ask MD for MBS order.  She reports pt has difficulty with both food and liquids but questions if pt has some issues. She advises this occurs more later in the meal or at the end. Pt denies having recurrent pneumonias- does admit to some weight loss. He lives alone with people coming to provide occasional assistance. Clinical Impression: Clinical Impression: Patient presents with mild oropharyngeal dysphagia characterized by impaired oral retention/oral holding resulting in posterior escape of liquids into pharynx.  Swallow was initiated at pyriform sinus with liquids and this coupled with impaired laryngeal closure allows penetration and aspiration of both thin and nectar consistencies (silent and audible). Larger bolus of nectar swallowed, thus pt aspirated a larger amount.  In A-P view, pharyngeal swallow initiation noted with nectar thick barium at Left pyriform sinus.  Delayed reflexive cough was not effective to clear aspirates. Partial distention of pharyngoesophageal segment noted with inconsistent residual post-swallow at pyriform sinuses *that occasionally appeared mixed with secretions.  Compensation strategies  including chin tuck with small bolus size decreased penetration/aspiration to minimal and cued throat clear/cough after effective to clear trace penetrates/aspirates.  Recommend pt continue his soft/Dys3 diet with thin liquid using chin tuck posture with liquids and intermittent cough/throat clear.  Of note, when pt reflexively coughed with aspiration on MBS - he did NOT coorelate this to swallowing/aspiration/dysphagia.  Using video feeds, educated pt and his daughter to findings/recommendations.  Recommend pt follow up with [facility] or OP SLP after GI (daughter reports pt to see GI 12/29/2022).  HH or OP SLP advised to address timing of swallow as well as laryngeal vestibule closure.  Provided information in writing and verbally to pt and his daughter. Thanks for this consult. Factors that may increase risk of adverse event in presence of aspiration ([place] 2021): Limited mobility; Frail or deconditioned Recommendations/Plan: Swallowing Evaluation Recommendations Swallowing Evaluation Recommendations Recommendations: PO diet PO Diet Recommendation: Dysphagia 3 (Mechanical soft); Thin liquids (Level 0) Liquid Administration via: Cup; Straw Medication Administration: Whole meds with puree Supervision: Patient able to self-feed Swallowing strategies  : Small bites/sips; Chin tuck; Clear throat intermittently Postural changes: Stay upright 30-60 min after meals; Position pt fully upright for meals; Small frequent meals Oral care recommendations: Oral care BID (2x/day) Treatment Plan Treatment Plan Treatment recommendations: Therapy as outlined in treatment plan below Follow-up recommendations: Home health SLP Recommendations Recommendations for follow up therapy are one component of a multi-disciplinary discharge planning process, led by the attending physician.  Recommendations may be updated based on patient status, additional functional criteria and insurance authorization. Assessment: Orofacial Exam: Orofacial  Exam Oral Cavity: Oral Hygiene: Xerostomia Oral Cavity - Dentition: Dentures, top; Adequate natural dentition Orofacial Anatomy: WFL Oral Motor/Sensory Function: WFL Anatomy: Anatomy: WFL Thin Liquids: Thin Liquids (Level 0) Thin Liquids : Impaired Bolus delivery method: Spoon; Cup; Straw Thin Liquid - Impairment: Oral Impairment; Pharyngeal impairment; Esophageal impairment Lip Closure: Not impaired Tongue control during bolus hold: Cohesive bolus between tongue to palatal seal Bolus transport/lingual motion: Brisk tongue motion Oral residue: Complete oral clearance Location of oral residue : N/A Initiation of swallow : Pyriform sinuses Soft palate elevation: Complete Laryngeal elevation: Complete superior movement of thyroid cartilage with complete approximation of arytenoids to epiglottic petiole Anterior hyoid excursion: Complete Epiglottic movement: Complete Laryngeal vestibule closure: Incomplete Pharyngeal stripping wave : Present - complete Pharyngeal contraction (A/P view only): N/A Pharyngoesophageal segment opening: Partial distention/partial duration, partial obstruction of flow Tongue base retraction: Trace column of contrast or air between tongue base and PPW Pharyngeal residue: Trace residue within or on pharyngeal structures Location of pharyngeal residue: Valleculae; Aryepiglottic folds; Pyriform sinuses Penetration/Aspiration Scale (PAS) score: 8.  Material enters airway, passes BELOW cords without attempt by patient to eject out (silent aspiration); 3.  Material enters airway, remains ABOVE vocal cords and not ejected out; 2.  Material enters airway, remains ABOVE vocal cords then ejected out; 7.  Material enters airway, passes BELOW cords and not ejected out despite cough attempt by patient Esophageal impairment: Esophageal retention with retrograde flow below pharyngoesophageal segment (PES)  Mildly Thick Liquids: Mildly thick liquids (Level 2, nectar thick) Mildly thick liquids (Level 2, nectar  thick): Impaired Bolus delivery method: Spoon; Cup Mildly Thick Liquid -  Impairment: Oral Impairment; Pharyngeal impairment Lip Closure: Not impaired Tongue control during bolus hold: Posterior escape of less than half of bolus Bolus transport/lingual motion: Brisk tongue motion Oral residue: Trace residue lining oral structures Location of oral residue : Tongue Initiation of swallow : Pyriform sinuses Soft palate elevation: Complete Laryngeal elevation: Complete superior movement of thyroid cartilage with complete approximation of arytenoids to epiglottic petiole Anterior hyoid excursion: Complete Epiglottic movement: Complete Laryngeal vestibule closure: Incomplete Pharyngeal stripping wave : Present - complete Pharyngeal contraction (A/P view only): Incomplete Pharyngoesophageal segment opening: Partial distention/partial duration, partial obstruction of flow Tongue base retraction: No contrast between tongue base and posterior pharyngeal wall (PPW) Pharyngeal residue: Trace residue within or on pharyngeal structures Location of pharyngeal residue: Valleculae; Pyriform sinuses Penetration/Aspiration Scale (PAS) score: 5.  Material enters airway, CONTACTS cords and not ejected out; 8.  Material enters airway, passes BELOW cords without attempt by patient to eject out (silent aspiration)  Moderately Thick Liquids: Moderately thick liquids (Level 3, honey thick) Moderately thick liquids (Level 3, honey thick): Impaired Moderately Thick Liquid - Impairment: Oral Impairment; Pharyngeal impairment Lip Closure: Not impaired Tongue control during bolus hold: Posterior escape of greater than half of bolus Bolus transport/lingual motion: Brisk tongue motion Oral residue: Residue collection on oral structures Location of oral residue : Tongue Initiation of swallow : Valleculae; Pyriform sinuses Soft palate elevation: Complete Laryngeal elevation: Partial or minimal superior movement and approximation Anterior hyoid  excursion: Complete Epiglottic movement: Complete Laryngeal vestibule closure: Incomplete Pharyngeal stripping wave : Present - complete Pharyngeal contraction (A/P view only): N/A Pharyngoesophageal segment opening: Partial distention/partial duration, partial obstruction of flow Tongue base retraction: No contrast between tongue base and posterior pharyngeal wall (PPW) Pharyngeal residue: Collection of residue within or on pharyngeal structures Location of pharyngeal residue: Valleculae; Tongue base; Pyriform sinuses Penetration/Aspiration Scale (PAS) score: 1.  Material does not enter airway  Puree: Puree Puree: Impaired Puree - Impairment: Oral Impairment; Pharyngeal impairment Lip Closure: Not impaired Bolus transport/lingual motion: Slow tongue motion Oral residue: Complete oral clearance Location of oral residue : N/A Initiation of swallow: Posterior laryngeal surface of the epiglottis Soft palate elevation: Complete Laryngeal elevation: Complete superior movement of thyroid cartilage with complete approximation of arytenoids to epiglottic petiole Anterior hyoid excursion: Complete Epiglottic movement: Complete Laryngeal vestibule closure: Incomplete Pharyngeal stripping wave : Present - complete Pharyngeal contraction (A/P view only): N/A Pharyngoesophageal segment opening: Partial distention/partial duration, partial obstruction of flow Tongue base retraction: No contrast between tongue base and posterior pharyngeal wall (PPW) Pharyngeal residue: Trace residue within or on pharyngeal structures; Collection of residue within or on pharyngeal structures Location of pharyngeal residue: Pyriform sinuses Penetration/Aspiration Scale (PAS) score: 1.  Material does not enter airway Solid: Solid Solid: Impaired Solid - Impairment: Oral Impairment; Pharyngeal impairment Lip Closure: Not impaired Bolus preparation/mastication: Timely and efficient chewing and mashing Bolus transport/lingual motion: Slow tongue motion  Oral residue: Complete oral clearance Location of oral residue : N/A Initiation of swallow: Valleculae Soft palate elevation: Complete Laryngeal elevation: Complete superior movement of thyroid cartilage with complete approximation of arytenoids to epiglottic petiole Anterior hyoid excursion: Complete Epiglottic movement: Complete Laryngeal vestibule closure: Incomplete Pharyngeal stripping wave : Present - complete Pharyngeal contraction (A/P view only): N/A Pharyngoesophageal segment opening: Complete distension and complete duration, no obstruction of flow Tongue base retraction: No contrast between tongue base and posterior pharyngeal wall (PPW) Pharyngeal residue: Trace residue within or on pharyngeal structures Location of pharyngeal residue: Pyriform sinuses Penetration/Aspiration Scale (PAS) score:  1.  Material does not enter airway Pill: Pill Pill: WFL Consistency administered : puree Compensatory Strategies: Compensatory Strategies Compensatory strategies: Yes Straw: Effective Effective Straw: Thin liquid (Level 0) Ineffective Straw: Thin liquid (Level 0) Chin tuck: Effective Effective Chin Tuck: Thin liquid (Level 0); Mildly thick liquid (Level 2, nectar thick)   General Information: Caregiver present: Yes Earl [name], daughter)  Diet Prior to this Study: Regular; Thin liquids (Level 0)   Temperature : -- (n/a)   Respiratory Status: WFL   Supplemental O2: None (Room air)   History of Recent Intubation: No  Behavior/Cognition: Alert; Cooperative; Pleasant mood Self-Feeding Abilities: Able to self-feed Baseline vocal quality/speech: Normal Volitional Cough: Able to elicit Volitional Swallow: Able to elicit Exam Limitations: No limitations Goal Planning: Prognosis for improved oropharyngeal function: Guarded No data recorded No data recorded No data recorded No data recorded Pain: Pain Assessment Pain Assessment: No/denies pain End of Session: Start Time:SLP Start Time (ACUTE ONLY): 1401 Stop Time: SLP Stop Time  (ACUTE ONLY): 1441 Time Calculation:SLP Time Calculation (min) (ACUTE ONLY): 40 min Charges: SLP Evaluations $ SLP Speech Visit: 1 Visit SLP Evaluations $Outpatient MBS Swallow: 1 Procedure $Swallowing Treatment: 1 Procedure SLP visit diagnosis: SLP Visit Diagnosis: Dysphagia, oropharyngeal phase (R13.12) Past Medical History: Past Medical History: Diagnosis Date  Adenomatous colon polyp   Allergy   Anal fissure   Arthritis   BPH (benign prostatic hyperplasia)   Diabetes mellitus (HCC)   GERD (gastroesophageal reflux disease)   Hyperlipidemia   Hypertension   Hypothyroidism   Other testicular hypofunction   Stroke [facility])  Past Surgical History: Past Surgical History: Procedure Laterality Date  CIRCUMCISION N/A 06/11/2019  Procedure: CIRCUMCISION ADULT;  Surgeon: [name], MD;  Location: [facility];  Service: Urology;  Laterality: N/A;  COLONOSCOPY    INGUINAL HERNIA REPAIR Bilateral   1968 and 2004  LOOP RECORDER INSERTION N/A 07/28/2017  Procedure: LOOP RECORDER INSERTION;  Surgeon: Deboraha Sprang, MD;  Location: [place] CV LAB;  Service: Cardiovascular;  Laterality: N/A;  RIGHT/LEFT HEART CATH AND CORONARY ANGIOGRAPHY N/A 10/07/2021  Procedure: RIGHT/LEFT HEART CATH AND CORONARY ANGIOGRAPHY;  Surgeon: Larey Dresser, MD;  Location: [place] [facility] LAB;  Service: Cardiovascular;  Laterality: N/A;  TEE WITHOUT CARDIOVERSION N/A 07/28/2017  Procedure: TRANSESOPHAGEAL ECHOCARDIOGRAM (TEE);  Surgeon: Sueanne Margarita, MD;  Location: [facility] ENDOSCOPY;  Service: Cardiovascular;  Laterality: N/A; Earl [name] 12/28/2022, 5:21 PM Kathleen Lime, MS [facility] SLP Acute Rehab Services Office [phone] CLINICAL DATA:  82 year old male with history of dysphagia after CVA in 2019 presents with increased hoarseness, coughing with meals. EXAM: MODIFIED BARIUM SWALLOW TECHNIQUE: Different consistencies of barium were administered orally to the patient by the Speech Pathologist. Imaging of the pharynx was performed in the lateral  projection. Brynda Greathouse PA-C was present in the fluoroscopy room during this study, which was supervised and interpreted by Aletta Edouard, MD. FLUOROSCOPY: Radiation Exposure Index (as provided by the fluoroscopic device): 11.4 mGy Kerma COMPARISON:  DG SWALLOW FUNC 03/20/2020. FINDINGS: Vestibular Penetration: Penetration observed with thin liquids, nectar thick liquids Aspiration: Silent aspiration observed with thin and nectar-thick liquids. Inconsistently and incompletely cleared with forced or delayed cough Other:  None. IMPRESSION: Aspiration noted with thin and nectar-thick liquids. Please refer to the Speech Pathologists report for complete details and recommendations. Electronically Signed   By: Aletta Edouard M.D.   On: 12/28/2022 16:16       Latest Ref Rng & Units 12/14/2022    5:11 PM 09/12/2022  12:00 AM 07/06/2022   11:33 AM  CBC  WBC 3.8 - 10.8 Thousand/uL 5.0  4.9  4.3   Hemoglobin 13.2 - 17.1 g/dL 11.6  13.4  12.4   Hematocrit 38.5 - 50.0 % 33.4  38.7  36.0   Platelets 140 - 400 Thousand/uL 218  197  202      Physical Exam: BP 102/60   Pulse 68   Ht '5\' 11"'$  (1.803 m)   Wt 157 lb (71.2 kg)   SpO2 97%   BMI 21.90 kg/m  Constitutional: Pleasant,well-developed, male in no acute distress. HEENT: Normocephalic and atraumatic. Conjunctivae are normal. No scleral icterus. Neck supple.  Cardiovascular: Normal rate, regular rhythm.  Pulmonary/chest: Effort normal and breath sounds normal.  Abdominal: Soft, nondistended, nontender.  There are no masses palpable.  Extremities: Mild LE edema, R lower leg has ulcer which is bandaged. Lymphadenopathy: No cervical adenopathy noted. Neurological: Alert and oriented to person place and time. Skin: Skin is warm and dry. No rashes noted. Psychiatric: Normal mood and affect. Behavior is normal.   ASSESSMENT: 82 y.o. male here for assessment of the following  1. Heme positive stool   2. Anemia, unspecified type   3.  Anticoagulated   4. History of colon polyps    As above, patient with a relatively recent colonoscopy, no high risk lesions.  He apparently has been having stool test yearly in the setting of anticoagulation and had a recent heme positive stool in December.  He denies any problems with his bowels, no overt bleeding.  He had a very mild iron deficiency in 2022, has been taking iron intermittently, has chronic anemia that is stable.  We discussed if he wanted to proceed with a colonoscopy or evaluation of his bowel given this stool test that was positive.  We discussed risks of pursuing endoscopic procedures which are risks of anesthesia given his history of heart failure and A-fib, risks of recurrent CVA while holding off on anticoagulation, etc.  Risks of not pursuing endoscopic evaluation would be missing potentially an advanced polyp or colon cancer.  We discussed risks of each of these options.  I think it is unlikely we are missing a high risk lesion in his colon given his relatively recent colonoscopy, although can't guarantee that without looking.  They agree with this.  The patient really is not interested in any endoscopic evaluation at this time given his age and comorbidities, at risk for complications.  I will plan on checking his iron studies at this point in time.  If he has a persistent iron deficiency he may be more willing to consider this.  I did offer him the colonoscopy, but for now he and his daughter want to hold off and will contact me if they reconsider.  I will let them know results of iron studies.  If normal and he has no overt bleeding their plan is to not pursue further endoscopic evaluation.  I think that is reasonable given his age and comorbidities.  We discussed there is increased risk for false positive stool test in the setting of anticoagulation and that is possible we are dealing with that.  Otherwise reviewed modified barium swallow with them, he is having some evidence  of aspiration and will follow-up with speech path.  He has no dysphagia, no plans to repeat EGD based on discussion with him today.   Overall given his decision as above, would strongly recommend against any further stool testing for this patient.  PLAN: -  lab today for TIBC / ferritin panel  - no plans for colonoscopy now - discussed risks / benefits of performing colonoscopy and not performing colonoscopy, they decline colonoscopy unless IDA or overt bleeding - follow up with speech path, no dysphagia, no plans for EGD at this time - no further stool testing  Jolly Mango, MD [facility]

## 2023-01-02 ENCOUNTER — Other Ambulatory Visit: Payer: Self-pay

## 2023-01-02 DIAGNOSIS — R471 Dysarthria and anarthria: Secondary | ICD-10-CM

## 2023-01-03 ENCOUNTER — Ambulatory Visit: Payer: Medicare Other | Attending: Genetic Counselor | Admitting: Genetic Counselor

## 2023-01-03 ENCOUNTER — Other Ambulatory Visit (HOSPITAL_COMMUNITY): Payer: Self-pay

## 2023-01-03 DIAGNOSIS — E854 Organ-limited amyloidosis: Secondary | ICD-10-CM

## 2023-01-04 ENCOUNTER — Ambulatory Visit: Payer: Medicare Other | Admitting: Oncology

## 2023-01-05 ENCOUNTER — Ambulatory Visit: Payer: Medicare Other | Admitting: Oncology

## 2023-01-06 ENCOUNTER — Ambulatory Visit: Payer: Medicare Other | Admitting: Physician Assistant

## 2023-01-06 ENCOUNTER — Encounter (HOSPITAL_COMMUNITY): Payer: Self-pay | Admitting: Cardiology

## 2023-01-09 ENCOUNTER — Telehealth (HOSPITAL_COMMUNITY): Payer: Self-pay

## 2023-01-09 ENCOUNTER — Encounter (HOSPITAL_BASED_OUTPATIENT_CLINIC_OR_DEPARTMENT_OTHER): Payer: Medicare Other | Attending: General Surgery | Admitting: General Surgery

## 2023-01-09 DIAGNOSIS — E1122 Type 2 diabetes mellitus with diabetic chronic kidney disease: Secondary | ICD-10-CM | POA: Insufficient documentation

## 2023-01-09 DIAGNOSIS — I130 Hypertensive heart and chronic kidney disease with heart failure and stage 1 through stage 4 chronic kidney disease, or unspecified chronic kidney disease: Secondary | ICD-10-CM | POA: Insufficient documentation

## 2023-01-09 DIAGNOSIS — I872 Venous insufficiency (chronic) (peripheral): Secondary | ICD-10-CM | POA: Insufficient documentation

## 2023-01-09 DIAGNOSIS — E11622 Type 2 diabetes mellitus with other skin ulcer: Secondary | ICD-10-CM | POA: Insufficient documentation

## 2023-01-09 DIAGNOSIS — L97212 Non-pressure chronic ulcer of right calf with fat layer exposed: Secondary | ICD-10-CM | POA: Diagnosis not present

## 2023-01-09 DIAGNOSIS — L97811 Non-pressure chronic ulcer of other part of right lower leg limited to breakdown of skin: Secondary | ICD-10-CM | POA: Diagnosis not present

## 2023-01-09 DIAGNOSIS — I5022 Chronic systolic (congestive) heart failure: Secondary | ICD-10-CM | POA: Insufficient documentation

## 2023-01-09 DIAGNOSIS — L97812 Non-pressure chronic ulcer of other part of right lower leg with fat layer exposed: Secondary | ICD-10-CM | POA: Diagnosis not present

## 2023-01-09 DIAGNOSIS — N182 Chronic kidney disease, stage 2 (mild): Secondary | ICD-10-CM | POA: Diagnosis not present

## 2023-01-09 DIAGNOSIS — I6930 Unspecified sequelae of cerebral infarction: Secondary | ICD-10-CM | POA: Insufficient documentation

## 2023-01-09 NOTE — Progress Notes (Addendum)
Earl [name], Earl [name] ([id number]) [id number] Page 1 of 11 Visit Report for 01/09/2023 Chief Complaint Document Details Patient Name: Date of Service: TO [name], [place] [name] 01/09/2023 10:00 A M Medical Record Number: [id number] Patient Account Number: [id number] Date of Birth/Sex: Treating [name]: 1941/02/15 (82 y.o. M) Primary Care Provider: Kirtland Bouchard Other Clinician: Referring Provider: Treating Provider/Extender: Collie Siad in Treatment: 0 Information Obtained from: Patient Chief Complaint Patient presents for treatment of an open ulcer due to venous insufficiency Electronic Signature(s) Signed: 01/09/2023 11:16:47 AM By: Fredirick Maudlin MD FACS Previous Signature: 01/09/2023 10:21:59 AM Version By: Fredirick Maudlin MD FACS Entered By: Fredirick Maudlin on 01/09/2023 11:16:47 -------------------------------------------------------------------------------- Debridement Details Patient Name: Date of Service: TO [name], [place] [name] 01/09/2023 10:00 A M Medical Record Number: [id number] Patient Account Number: [id number] Date of Birth/Sex: Treating [name]: 1941-06-24 (82 y.o. [name] Primary Care Provider: Kirtland Bouchard Other Clinician: Referring Provider: Treating Provider/Extender: Collie Siad in Treatment: 0 Debridement Performed for Assessment: Wound #3 Right,Posterior Lower Leg Performed By: Physician Fredirick Maudlin, MD Debridement Type: Debridement Severity of Tissue Pre Debridement: Fat layer exposed Level of Consciousness (Pre-procedure): Awake and Alert Pre-procedure Verification/Time Out Yes - 11:10 Taken: Start Time: 11:11 Pain Control: Lidocaine 4% T opical Solution T Area Debrided (L x W): otal 7 (cm) x 4.5 (cm) = 31.5 (cm) Tissue and other material debrided: Non-Viable, Slough, Slough Level: Non-Viable Tissue Debridement Description: Selective/Open  Wound Instrument: Curette Bleeding: Minimum Hemostasis Achieved: Pressure Response to Treatment: Procedure was tolerated well Level of Consciousness (Post- Awake and Alert procedure): Post Debridement Measurements of Total Wound Length: (cm) 7 Width: (cm) 4.8 Depth: (cm) 0.1 Volume: (cm) 2.639 Character of Wound/Ulcer Post Debridement: Requires Further Debridement Severity of Tissue Post Debridement: Fat layer exposed Post Procedure Diagnosis Same as Pre-procedure Notes Scribed for Dr. Celine Ahr by Earl East, [name] Electronic Signature(s) Signed: 01/09/2023 11:21:45 AM By: Fredirick Maudlin MD FACS Signed: 01/11/2023 4:23:23 PM By: Earl East [name] Earl [name] ([id number]) [id number] Page 2 of 11 Entered By: Earl [name] on 01/09/2023 11:12:02 -------------------------------------------------------------------------------- Debridement Details Patient Name: Date of Service: TO [name], [place] [name] 01/09/2023 10:00 A M Medical Record Number: [id number] Patient Account Number: [id number] Date of Birth/Sex: Treating [name]: 10-26-1941 (82 y.o. [name] Primary Care Provider: Kirtland Bouchard Other Clinician: Referring Provider: Treating Provider/Extender: Collie Siad in Treatment: 0 Debridement Performed for Assessment: Wound #2 Right,Anterior Lower Leg Performed By: Physician Fredirick Maudlin, MD Debridement Type: Debridement Severity of Tissue Pre Debridement: Fat layer exposed Level of Consciousness (Pre-procedure): Awake and Alert Pre-procedure Verification/Time Out Yes - 11:10 Taken: Start Time: 11:11 Pain Control: Lidocaine 4% T opical Solution T Area Debrided (L x W): otal 0.8 (cm) x 0.5 (cm) = 0.4 (cm) Tissue and other material debrided: Non-Viable, Slough, Slough Level: Non-Viable Tissue Debridement Description: Selective/Open Wound Instrument: Curette Bleeding: Minimum Hemostasis Achieved:  Pressure Response to Treatment: Procedure was tolerated well Level of Consciousness (Post- Awake and Alert procedure): Post Debridement Measurements of Total Wound Length: (cm) 14.2 Width: (cm) 7.2 Depth: (cm) 0.1 Volume: (cm) 8.03 Character of Wound/Ulcer Post Debridement: Requires Further Debridement Severity of Tissue Post Debridement: Fat layer exposed Post Procedure Diagnosis Same as Pre-procedure Notes Scribed for Dr. Celine Ahr by Earl East, [name] Electronic Signature(s) Signed: 01/09/2023 11:21:45 AM By: Fredirick Maudlin MD FACS Signed: 01/11/2023 4:23:23 PM By: Earl East [name] Entered By: Earl [name] on 01/09/2023 11:13:11 -------------------------------------------------------------------------------- HPI Details Patient  Name: Date of Service: TO [name], [place] [name] 01/09/2023 10:00 A M Medical Record Number: [id number] Patient Account Number: [id number] Date of Birth/Sex: Treating [name]: Oct 23, 1941 (82 y.o. 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 Near the beginning of the year, he had increased swelling in his legs and developed an ulcer on his right calf. Apparently this has been treated with Betadine sugar dressings per his PCPs office. His right leg has multiple superficial open ulcers that all look like they started with blisters. There is slough accumulation in the posterior calf ulcer. He has a palpable pedal pulse. The skin of both legs is tight and shiny. Earl [name], Earl [name] ([id number]) [id number] Page 3 of 11 Electronic Signature(s) Signed: 01/09/2023 11:18:02 AM By: Fredirick Maudlin MD  FACS Previous Signature: 01/09/2023 10:28:02 AM Version By: Fredirick Maudlin MD FACS Entered By: Fredirick Maudlin on 01/09/2023 11:18:02 -------------------------------------------------------------------------------- Physical Exam Details Patient Name: Date of Service: TO [name], NA [name] 01/09/2023 10:00 A M Medical Record Number: [id number] Patient Account Number: [id number] Date of Birth/Sex: Treating [name]: 1941-08-09 (82 y.o. M) Primary Care Provider: Kirtland Bouchard Other Clinician: Referring Provider: Treating Provider/Extender: Earl [name] in Treatment: 0 Constitutional . . . . No acute distress. Respiratory Normal work of breathing on room air. Cardiovascular . 2+ pitting edema bilaterally. Notes 01/09/2023: His right leg has multiple superficial open ulcers that all look like they started with blisters. There is slough accumulation in the posterior calf ulcer. He has a palpable pedal pulse. The skin of both legs is tight and shiny. Electronic Signature(s) Signed: 01/09/2023 11:18:53 AM By: Fredirick Maudlin MD FACS Entered By: Fredirick Maudlin on 01/09/2023 11:18:53 -------------------------------------------------------------------------------- Physician Orders Details Patient Name: Date of Service: TO [name], [place] [name] 01/09/2023 10:00 A M Medical Record Number: [id number] Patient Account Number: [id number] Date of Birth/Sex: Treating [name]: 1941/08/09 (82 y.o. [name] Primary Care Provider: Kirtland Bouchard Other Clinician: Referring Provider: Treating Provider/Extender: Collie Siad in Treatment: 0 Verbal / Phone Orders: No Diagnosis Coding ICD-10 Coding Code Description [phone] Non-pressure chronic ulcer of right calf limited to breakdown of skin I87.2 Venous insufficiency (chronic) (peripheral) E11.622 Type 2 diabetes mellitus with other skin ulcer [id number] Chronic systolic (congestive) heart  failure I69.30 Unspecified sequelae of cerebral infarction N18.2 Chronic kidney disease, stage 2 (mild) Follow-up Appointments ppointment in 1 week. - Dr. Celine Ahr Rm 4 Return A Wednesday 01/18/23 at 12:45 Bathing/ Shower/ Hygiene May shower with protection but do not get wound dressing(s) wet. Protect dressing(s) with water repellant cover (for example, large plastic bag) or a cast cover and may then take shower. - May purchase a Cast protector from [facility] or [facility]. Please keep dressing clean and dry at all times Edema Control - Lymphedema / SCD / Other Right Lower Extremity Segmental Compressive Device. Use the Segmental Compressive Device on leg(s) 2-3 times a day for 45 - 60 minutes. If wearing any wraps or hose, do not remove them. Continue exercising as instructed. Elevate legs to the level of the heart or above for 30 minutes daily and/or when sitting for 3-4 times a day throughout the day. Patient to wear own compression stockings every day. - On  left leg Earl [name], Earl [name] ([id number]) [id number] Page 4 of 11 Compression stocking or Garment 20-30 mm/Hg pressure to: - on Left leg Wound Treatment Wound #1 - Lower Leg Wound Laterality: Right, Medial Cleanser: Soap and Water 1 x Per Week Discharge Instructions: May shower and wash wound with dial antibacterial soap and water prior to dressing change. Cleanser: Wound Cleanser 1 x Per Week Discharge Instructions: Cleanse the wound with wound cleanser prior to applying a clean dressing using gauze sponges, not tissue or cotton balls. Peri-Wound Care: Zinc Oxide Ointment 30g tube 1 x Per Week Discharge Instructions: Apply Zinc Oxide to periwound with each dressing change Prim Dressing: Sorbalgon AG Dressing, 4x4 (in/in) 1 x Per Week ary Discharge Instructions: Apply to wound bed as instructed Secondary Dressing: ABD Pad, 8x10 1 x Per Week Discharge Instructions: Apply over primary dressing as  directed. Secured With: Transpore Surgical Tape, 2x10 (in/yd) 1 x Per Week Discharge Instructions: Secure dressing with tape as directed. Compression Wrap: Urgo K2 Lite, two layer compression system, regular 1 x Per Week Discharge Instructions: Apply Urgo K2 Lite as directed (alternative to 3 layer compression). Wound #2 - Lower Leg Wound Laterality: Right, Anterior Cleanser: Soap and Water 1 x Per Week Discharge Instructions: May shower and wash wound with dial antibacterial soap and water prior to dressing change. Cleanser: Wound Cleanser 1 x Per Week Discharge Instructions: Cleanse the wound with wound cleanser prior to applying a clean dressing using gauze sponges, not tissue or cotton balls. Peri-Wound Care: Zinc Oxide Ointment 30g tube 1 x Per Week Discharge Instructions: Apply Zinc Oxide to periwound with each dressing change Prim Dressing: Sorbalgon AG Dressing, 4x4 (in/in) 1 x Per Week ary Discharge Instructions: Apply to wound bed as instructed Secondary Dressing: ABD Pad, 8x10 1 x Per Week Discharge Instructions: Apply over primary dressing as directed. Secured With: Transpore Surgical Tape, 2x10 (in/yd) 1 x Per Week Discharge Instructions: Secure dressing with tape as directed. Compression Wrap: Urgo K2 Lite, two layer compression system, regular 1 x Per Week Discharge Instructions: Apply Urgo K2 Lite as directed (alternative to 3 layer compression). Wound #3 - Lower Leg Wound Laterality: Right, Posterior Cleanser: Soap and Water 1 x Per Week Discharge Instructions: May shower and wash wound with dial antibacterial soap and water prior to dressing change. Cleanser: Wound Cleanser 1 x Per Week Discharge Instructions: Cleanse the wound with wound cleanser prior to applying a clean dressing using gauze sponges, not tissue or cotton balls. Peri-Wound Care: Zinc Oxide Ointment 30g tube 1 x Per Week Discharge Instructions: Apply Zinc Oxide to periwound with each dressing change Prim  Dressing: Sorbalgon AG Dressing, 4x4 (in/in) 1 x Per Week ary Discharge Instructions: Apply to wound bed as instructed Secondary Dressing: ABD Pad, 8x10 1 x Per Week Discharge Instructions: Apply over primary dressing as directed. Secured With: Transpore Surgical Tape, 2x10 (in/yd) 1 x Per Week Discharge Instructions: Secure dressing with tape as directed. Compression Wrap: Urgo K2 Lite, two layer compression system, regular 1 x Per Week Discharge Instructions: Apply Urgo K2 Lite as directed (alternative to 3 layer compression). Electronic Signature(s) Signed: 01/09/2023 11:21:45 AM By: Fredirick Maudlin MD FACS Entered By: Fredirick Maudlin on 01/09/2023 11:19:05 Earl [name] ([id number] Page 5 of 11 -------------------------------------------------------------------------------- Problem List Details Patient Name: Date of Service: TO [name], [place] [name] 01/09/2023 10:00 A M Medical Record Number: [id number] Patient Account Number: [id number] Date of Birth/Sex: Treating [name]: 1941-08-19 (82 y.o. M) Primary Care Provider: Kirtland Bouchard Other Clinician:  Referring Provider: Treating Provider/Extender: Collie Siad in Treatment: 0 Active Problems ICD-10 Encounter Code Description Active Date MDM Diagnosis L97.211 Non-pressure chronic ulcer of right calf limited to breakdown of skin 01/09/2023 No Yes L97.811 Non-pressure chronic ulcer of other part of right lower leg limited to breakdown 01/09/2023 No Yes of skin I87.2 Venous insufficiency (chronic) (peripheral) 01/09/2023 No Yes E11.622 Type 2 diabetes mellitus with other skin ulcer 01/09/2023 No Yes [id number] Chronic systolic (congestive) heart failure 01/09/2023 No Yes I69.30 Unspecified sequelae of cerebral infarction 01/09/2023 No Yes N18.2 Chronic kidney disease, stage 2 (mild) 01/09/2023 No Yes Inactive Problems Resolved Problems Electronic Signature(s) Signed:  01/09/2023 11:16:21 AM By: Fredirick Maudlin MD FACS Previous Signature: 01/09/2023 10:21:45 AM Version By: Fredirick Maudlin MD FACS Entered By: Fredirick Maudlin on 01/09/2023 11:16:20 -------------------------------------------------------------------------------- Progress Note Details Patient Name: Date of Service: TO [name], [place] [name] 01/09/2023 10:00 A M Medical Record Number: [id number] Patient Account Number: [id number] Date of Birth/Sex: Treating [name]: 03/07/1941 (82 y.o. 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 Near the beginning of the year, he had increased swelling in his legs and developed an ulcer on his right calf. Apparently this has been treated with Betadine sugar dressings per his PCPs office. His right leg has multiple superficial open ulcers that all look like they started with blisters. There is slough accumulation in the posterior calf ulcer. He has a palpable pedal pulse. The skin of both legs is tight and shiny. Patient History Allergies penicillin (Severity: Moderate) Social History Never smoker, Marital Status - Widowed, Alcohol Use - Never, Drug Use - No History, Caffeine Use - Never. Medical History Eyes Denies history of Cataracts, Glaucoma, Optic Neuritis Ear/Nose/Mouth/Throat Denies history of Chronic sinus problems/congestion, Middle ear problems Cardiovascular Patient has history of Arrhythmia, Congestive  Heart Failure, Hypertension Endocrine Patient has history of Type II Diabetes Genitourinary Denies history of End Stage Renal Disease Neurologic Denies history of Neuropathy Oncologic Denies history of Received Chemotherapy, Received Radiation Psychiatric Denies history of Anorexia/bulimia, Confinement Anxiety Patient is treated with Oral Agents. Blood sugar is tested. Review of Systems (ROS) Constitutional Symptoms (General Health) Denies complaints or symptoms of Fatigue, Fever, Chills, Marked Weight Change. Eyes Complains or has symptoms of Dry Eyes, Glasses / Contacts - glasses. Denies complaints or symptoms of Vision Changes. Ear/Nose/Mouth/Throat Denies complaints or symptoms of Chronic sinus problems or rhinitis. Respiratory Denies complaints or symptoms of Chronic or frequent coughs, Shortness of Breath. Cardiovascular Denies complaints or symptoms of Chest pain. Gastrointestinal Denies complaints or symptoms of Frequent diarrhea, Nausea, Vomiting. Endocrine Denies complaints or symptoms of Heat/cold intolerance. Genitourinary Denies complaints or symptoms of Frequent urination. Integumentary (Skin) Denies complaints or symptoms of Wounds. Musculoskeletal Complains or has symptoms of Muscle Weakness. Denies complaints or symptoms of Muscle Pain. Psychiatric Denies complaints or symptoms of Claustrophobia. Objective Constitutional No acute distress. Vitals Time Taken: 10:22 AM, Height: 71 in, Source: Stated, Weight: 151 lbs, Source: Stated, BMI: 21.1, Pulse: 78 bpm, Respiratory Rate: 18 breaths/min, Blood Pressure: 105/65 mmHg. Respiratory Normal work of breathing on room air.  Cardiovascular 2+ pitting edema bilaterally. General Notes: 01/09/2023: His right leg has multiple superficial open ulcers that all look like they started with blisters. There is slough accumulation in the posterior calf ulcer. He has a palpable pedal pulse. The skin of both legs is tight  and shiny. Earl [name], Earl [name] ([id number]) [id number] Page 7 of 11 Integumentary (Hair, Skin) Wound #1 status is Open. Original cause of wound was Blister. The date acquired was: 11/23/2022. The wound is located on the Right,Medial Lower Leg. The wound measures 0.3cm length x 0.3cm width x 0.1cm depth; 0.071cm^2 area and 0.007cm^3 volume. There is no tunneling or undermining noted. There is a medium amount of serous drainage noted. There is medium (34-66%) pink granulation within the wound bed. There is a medium (34-66%) amount of necrotic tissue within the wound bed including Adherent Slough. The periwound skin appearance had no abnormalities noted for texture. The periwound skin appearance had no abnormalities noted for color. The periwound skin appearance exhibited: Maceration. The periwound skin appearance did not exhibit: Dry/Scaly. Wound #2 status is Open. Original cause of wound was Blister. The date acquired was: 11/23/2022. The wound is located on the Right,Anterior Lower Leg. The wound measures 14.2cm length x 7.2cm width x 0.1cm depth; 80.299cm^2 area and 8.03cm^3 volume. There is Fat Layer (Subcutaneous Tissue) exposed. There is no tunneling noted. There is a medium amount of serous drainage noted. There is medium (34-66%) pink granulation within the wound bed. There is a medium (34-66%) amount of necrotic tissue within the wound bed including Adherent Slough. The periwound skin appearance had no abnormalities noted for texture. The periwound skin appearance exhibited: Maceration, Rubor. The periwound skin appearance did not exhibit: Dry/Scaly. Periwound temperature was noted as No Abnormality. The periwound has tenderness on palpation. Wound #3 status is Open. Original cause of wound was Blister. The date acquired was: 11/23/2022. The wound is located on the Right,Posterior Lower Leg. The wound measures 7cm length x 4.8cm width x 0.1cm depth; 26.389cm^2 area and  2.639cm^3 volume. There is no tunneling or undermining noted. There is a medium amount of serous drainage noted. There is medium (34-66%) pink granulation within the wound bed. There is a medium (34-66%) amount of necrotic tissue within the wound bed including Adherent Slough. The periwound skin appearance had no abnormalities noted for texture. The periwound skin appearance exhibited: Rubor. Periwound temperature was noted as No Abnormality. The periwound has tenderness on palpation. Assessment Active Problems ICD-10 Non-pressure chronic ulcer of right calf limited to breakdown of skin Non-pressure chronic ulcer of other part of right lower leg limited to breakdown of skin Venous insufficiency (chronic) (peripheral) Type 2 diabetes mellitus with other skin ulcer Chronic systolic (congestive) heart failure Unspecified sequelae of cerebral infarction Chronic kidney disease, stage 2 (mild) Procedures Wound #2 Pre-procedure diagnosis of Wound #2 is a Diabetic Wound/Ulcer of the Lower Extremity located on the Right,Anterior Lower Leg .Severity of Tissue Pre Debridement is: Fat layer exposed. There was a Selective/Open Wound Non-Viable Tissue Debridement with a total area of 0.4 sq cm performed by Fredirick Maudlin, MD. With the following instrument(s): Curette to remove Non-Viable tissue/material. Material removed includes [facility] after achieving pain control using Lidocaine 4% T opical Solution. No specimens were taken. A time out was conducted at 11:10, prior to the start of the procedure. A Minimum amount of bleeding was controlled with Pressure. The procedure was tolerated well. Post Debridement Measurements: 14.2cm length x 7.2cm width x 0.1cm depth; 8.03cm^3 volume. Character of Wound/Ulcer Post Debridement requires  further debridement. Severity of Tissue Post Debridement is: Fat layer exposed. Post procedure Diagnosis Wound #2: Same as Pre-Procedure General Notes: Scribed for Dr. Celine Ahr by  Earl East, [name]. Pre-procedure diagnosis of Wound #2 is a Diabetic Wound/Ulcer of the Lower Extremity located on the Right,Anterior Lower Leg . There was a Three Layer Compression Therapy Procedure by Earl East, [name]. Post procedure Diagnosis Wound #2: Same as Pre-Procedure Wound #3 Pre-procedure diagnosis of Wound #3 is a Diabetic Wound/Ulcer of the Lower Extremity located on the Right,Posterior Lower Leg .Severity of Tissue Pre Debridement is: Fat layer exposed. There was a Selective/Open Wound Non-Viable Tissue Debridement with a total area of 31.5 sq cm performed by Fredirick Maudlin, MD. With the following instrument(s): Curette to remove Non-Viable tissue/material. Material removed includes [facility] after achieving pain control using Lidocaine 4% T opical Solution. No specimens were taken. A time out was conducted at 11:10, prior to the start of the procedure. A Minimum amount of bleeding was controlled with Pressure. The procedure was tolerated well. Post Debridement Measurements: 7cm length x 4.8cm width x 0.1cm depth; 2.639cm^3 volume. Character of Wound/Ulcer Post Debridement requires further debridement. Severity of Tissue Post Debridement is: Fat layer exposed. Post procedure Diagnosis Wound #3: Same as Pre-Procedure General Notes: Scribed for Dr. Celine Ahr by Earl East, [name]. Pre-procedure diagnosis of Wound #3 is a Diabetic Wound/Ulcer of the Lower Extremity located on the Right,Posterior Lower Leg . There was a Three Layer Compression Therapy Procedure by Earl East, [name]. Post procedure Diagnosis Wound #3: Same as Pre-Procedure Wound #1 Pre-procedure diagnosis of Wound #1 is a Diabetic Wound/Ulcer of the Lower Extremity located on the Right,Medial Lower Leg . There was a Three Layer Compression Therapy Procedure by Earl East, [name]. Post procedure Diagnosis Wound #1: Same as Pre-Procedure Plan Follow-up Appointments: Earl [name], Earl [name] ([id number])  [id number].pdf Page 8 of 11 Return Appointment in 1 week. - Dr. Celine Ahr Rm 4 Wednesday 01/18/23 at 12:45 Bathing/ Shower/ Hygiene: May shower with protection but do not get wound dressing(s) wet. Protect dressing(s) with water repellant cover (for example, large plastic bag) or a cast cover and may then take shower. - May purchase a Cast protector from [facility] or [facility]. Please keep dressing clean and dry at all times Edema Control - Lymphedema / SCD / Other: Segmental Compressive Device. Use the Segmental Compressive Device on leg(s) 2-3 times a day for 45 - 60 minutes. If wearing any wraps or hose, do not remove them. Continue exercising as instructed. Elevate legs to the level of the heart or above for 30 minutes daily and/or when sitting for 3-4 times a day throughout the day. Patient to wear own compression stockings every day. - On left leg Compression stocking or Garment 20-30 mm/Hg pressure to: - on Left leg WOUND #1: - Lower Leg Wound Laterality: Right, Medial Cleanser: Soap and Water 1 x Per Week/ Discharge Instructions: May shower and wash wound with dial antibacterial soap and water prior to dressing change. Cleanser: Wound Cleanser 1 x Per Week/ Discharge Instructions: Cleanse the wound with wound cleanser prior to applying a clean dressing using gauze sponges, not tissue or cotton balls. Peri-Wound Care: Zinc Oxide Ointment 30g tube 1 x Per Week/ Discharge Instructions: Apply Zinc Oxide to periwound with each dressing change Prim Dressing: Sor[employer] Dressing, 4x4 (in/in) 1 x Per Week/ ary Discharge Instructions: Apply to wound bed as instructed Secondary Dressing: ABD Pad, 8x10 1 x Per Week/ Discharge Instructions: Apply over primary dressing as directed. Secured With: [employer]  Surgical T ape, 2x10 (in/yd) 1 x Per Week/ Discharge Instructions: Secure dressing with tape as directed. Com pression Wrap: Urgo K2 Lite, two layer compression system, regular  1 x Per Week/ Discharge Instructions: Apply Urgo K2 Lite as directed (alternative to 3 layer compression). WOUND #2: - Lower Leg Wound Laterality: Right, Anterior Cleanser: Soap and Water 1 x Per Week/ Discharge Instructions: May shower and wash wound with dial antibacterial soap and water prior to dressing change. Cleanser: Wound Cleanser 1 x Per Week/ Discharge Instructions: Cleanse the wound with wound cleanser prior to applying a clean dressing using gauze sponges, not tissue or cotton balls. Peri-Wound Care: Zinc Oxide Ointment 30g tube 1 x Per Week/ Discharge Instructions: Apply Zinc Oxide to periwound with each dressing change Prim Dressing: Sorbal[employer] Dressing, 4x4 (in/in) 1 x Per Week/ ary Discharge Instructions: Apply to wound bed as instructed Secondary Dressing: ABD Pad, 8x10 1 x Per Week/ Discharge Instructions: Apply over primary dressing as directed. Secured With: [employer] T ape, 2x10 (in/yd) 1 x Per Week/ Discharge Instructions: Secure dressing with tape as directed. Com pression Wrap: Urgo K2 Lite, two layer compression system, regular 1 x Per Week/ Discharge Instructions: Apply Urgo K2 Lite as directed (alternative to 3 layer compression). WOUND #3: - Lower Leg Wound Laterality: Right, Posterior Cleanser: Soap and Water 1 x Per Week/ Discharge Instructions: May shower and wash wound with dial antibacterial soap and water prior to dressing change. Cleanser: Wound Cleanser 1 x Per Week/ Discharge Instructions: Cleanse the wound with wound cleanser prior to applying a clean dressing using gauze sponges, not tissue or cotton balls. Peri-Wound Care: Zinc Oxide Ointment 30g tube 1 x Per Week/ Discharge Instructions: Apply Zinc Oxide to periwound with each dressing change Prim Dressing: Sor[employer] Dressing, 4x4 (in/in) 1 x Per Week/ ary Discharge Instructions: Apply to wound bed as instructed Secondary Dressing: ABD Pad, 8x10 1 x Per Week/ Discharge Instructions:  Apply over primary dressing as directed. Secured With: [employer] T ape, 2x10 (in/yd) 1 x Per Week/ Discharge Instructions: Secure dressing with tape as directed. Com pression Wrap: Urgo K2 Lite, two layer compression system, regular 1 x Per Week/ Discharge Instructions: Apply Urgo K2 Lite as directed (alternative to 3 layer compression). 01/09/2023: This is an 82 year old man with congestive heart failure and diabetes. He recently developed increased leg swelling and subsequently developed ulcers on his right lower leg. His right leg has multiple superficial open ulcers that all look like they started with blisters. There is slough accumulation in the posterior calf ulcer. He has a palpable pedal pulse. The skin of both legs is tight and shiny. I used a curette to debride slough from the posterior calf ulcer. We will apply silver alginate and 3 layer compression. He does have compression stockings and we recommended that he wear a stocking on his left leg. Follow-up in 1 week. Electronic Signature(s) Signed: 01/16/2023 5:14:33 PM By: Fredirick Maudlin MD FACS Signed: 01/20/2023 3:08:51 PM By: Earl [name], Earl [name] Previous Signature: 01/09/2023 11:20:45 AM Version By: Fredirick Maudlin MD FACS Entered By: Earl Pilling on 01/16/2023 14:32:38 -------------------------------------------------------------------------------- HxROS Details Patient Name: Date of Service: TO [name], NA [name] 01/09/2023 10:00 A M Medical Record Number: [id number] Patient Account Number: [id number] Date of Birth/Sex: Treating [name]: February 02, 1941 (82 y.o. [name] Primary Care Provider: Kirtland Bouchard Other Clinician: Referring Provider: Treating Provider/Extender: Collie Siad in Treatment: 0 Earl [name] ([id number]) [id number] Page 9 of 11  Constitutional Symptoms (General Health) Complaints and Symptoms: Negative for: Fatigue; Fever;  Chills; Marked Weight Change Eyes Complaints and Symptoms: Positive for: Dry Eyes; Glasses / Contacts - glasses Negative for: Vision Changes Medical History: Negative for: Cataracts; Glaucoma; Optic Neuritis Ear/Nose/Mouth/Throat Complaints and Symptoms: Negative for: Chronic sinus problems or rhinitis Medical History: Negative for: Chronic sinus problems/congestion; Middle ear problems Respiratory Complaints and Symptoms: Negative for: Chronic or frequent coughs; Shortness of Breath Cardiovascular Complaints and Symptoms: Negative for: Chest pain Medical History: Positive for: Arrhythmia; Congestive Heart Failure; Hypertension Gastrointestinal Complaints and Symptoms: Negative for: Frequent diarrhea; Nausea; Vomiting Endocrine Complaints and Symptoms: Negative for: Heat/cold intolerance Medical History: Positive for: Type II Diabetes Treated with: Oral agents Blood sugar tested every day: Yes Tested : Genitourinary Complaints and Symptoms: Negative for: Frequent urination Medical History: Negative for: End Stage Renal Disease Integumentary (Skin) Complaints and Symptoms: Negative for: Wounds Musculoskeletal Complaints and Symptoms: Positive for: Muscle Weakness Negative for: Muscle Pain Psychiatric Complaints and Symptoms: Negative for: Claustrophobia Medical History: Negative for: Anorexia/bulimia; Confinement Anxiety Hematologic/Lymphatic Immunological Earl [name], Earl [name] ([id number]) [phone].pdf Page 10 of 11 Neurologic Medical History: Negative for: Neuropathy Oncologic Medical History: Negative for: Received Chemotherapy; Received Radiation Immunizations Pneumococcal Vaccine: Received Pneumococcal Vaccination: Yes Received Pneumococcal Vaccination On or After 60th Birthday: Yes Implantable Devices None Family and Social History Never smoker; Marital Status - Widowed; Alcohol Use: Never; Drug Use: No History; Caffeine Use:  Never; Financial Concerns: No; Food, Clothing or Shelter Needs: No; Support System Lacking: No; Transportation Concerns: No Physician Affirmation I have reviewed and agree with the above information. Electronic Signature(s) Signed: 01/09/2023 11:21:45 AM By: Fredirick Maudlin MD FACS Signed: 01/11/2023 4:23:23 PM By: Earl East [name] Entered By: Earl [name] on 01/09/2023 10:31:04 -------------------------------------------------------------------------------- SuperBill Details Patient Name: Date of Service: TO [name], NA [name] 01/09/2023 Medical Record Number: [id number] Patient Account Number: [id number] Date of Birth/Sex: Treating [name]: 03-20-1941 (82 y.o. 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, stage 2 (mild) Facility Procedures : CPT4 Code: [id number] Description: 99213 - WOUND CARE VISIT-LEV 3 EST PT Modifier: 25 Quantity: 1 : CPT4 Code: [id number] Description: [id number] - DEBRIDE WOUND 1ST 20 SQ CM OR < ICD-10 Diagnosis Description L97.211 Non-pressure chronic ulcer of right calf limited to breakdown of skin L97.811 Non-pressure chronic ulcer of other part of right lower leg limited to breakdown Modifier: of skin Quantity: 1 Physician Procedures : CPT4 Code Description Modifier [id number] 99214 - WC PHYS LEVEL 4 - EST PT 25 ICD-10 Diagnosis Description L97.211 Non-pressure chronic ulcer of right calf  limited to breakdown of skin L97.811 Non-pressure chronic ulcer of other part of right lower leg  limited to breakdown of skin I87.2 Venous insufficiency (chronic) (peripheral) E11.622 Type 2 diabetes mellitus with other skin ulcer Quantity: 1 : [id number] - WC PHYS DEBR WO ANESTH 20 SQ CM ICD-10 Diagnosis Description L97.211 Non-pressure chronic ulcer of right calf limited to breakdown of skin L97.811 Non-pressure chronic ulcer of other part of right lower leg limited to breakdown of skin Quantity: 1 : [id number] -  WC PHYS DEBR WO ANESTH EA ADD 20 CM ICD-10 Diagnosis Description L97.211 Non-pressure chronic ulcer of right calf limited to breakdown of skin L97.811 Non-pressure chronic ulcer of other part of right lower leg limited to breakdown of  skin Quantity: 1 Electronic Signature(s) Signed: 01/09/2023 11:40:56 AM By: Earl East [name] Signed: 01/09/2023 12:49:17 PM By: Fredirick Maudlin MD FACS Previous Signature: 01/09/2023 11:21:16 AM Version By: Fredirick Maudlin MD FACS Entered By: Earl [name] on 01/09/2023 11:40:56

## 2023-01-09 NOTE — Telephone Encounter (Signed)
Advanced Heart Failure Patient Advocate Encounter  Medication Samples have been provided to patient directly Drug name: Barnet Glasgow Qty: 2x 28 ct packages LOT: [id number] Exp.: 02/2025 SIG: Take 1 tablet by mouth twice daily   The patient has been instructed regarding the correct time, dose, and frequency of taking this medication, including desired effects and most common side effects.   Clista Bernhardt, CPhT Rx Patient Advocate Phone: [phone]

## 2023-01-10 ENCOUNTER — Other Ambulatory Visit (HOSPITAL_COMMUNITY): Payer: Self-pay | Admitting: Cardiology

## 2023-01-11 ENCOUNTER — Other Ambulatory Visit (HOSPITAL_COMMUNITY): Payer: Self-pay

## 2023-01-12 ENCOUNTER — Other Ambulatory Visit: Payer: Self-pay

## 2023-01-12 DIAGNOSIS — I480 Paroxysmal atrial fibrillation: Secondary | ICD-10-CM

## 2023-01-12 MED ORDER — APIXABAN 5 MG PO TABS: ORAL_TABLET | ORAL | 3 refills | Status: DC

## 2023-01-12 NOTE — Progress Notes (Signed)
GEORDI, BLYTHER ([id number]) [id number] Page 1 of 13 Visit Report for 01/09/2023 Allergy List Details Patient Name: Date of Service: TO [name], [place] [name] 01/09/2023 10:00 A M Medical Record Number: [id number] Patient Account Number: [id number] Date of Birth/Sex: Treating RN: [birth year] (82 y.o. [name] Primary Care Paulita Licklider: Kirtland Bouchard Other Clinician: Referring Brinna Divelbiss: Treating Sherie Dobrowolski/Extender: Clemens Catholic Weeks in Treatment: 0 Allergies Active Allergies penicillin Severity: Moderate Allergy Notes Electronic Signature(s) Signed: 01/11/2023 4:23:23 PM By: Blanche East RN Entered By: Blanche East on 01/09/2023 10:26:01 -------------------------------------------------------------------------------- Arrival Information Details Patient Name: Date of Service: TO [name], [place] [name] 01/09/2023 10:00 A M Medical Record Number: [id number] Patient Account Number: [id number] Date of Birth/Sex: Treating RN: 05-01-1941 (82 y.o. [name] Primary Care Orestes Geiman: Kirtland Bouchard Other Clinician: Referring Zayvier Caravello: Treating Aundray Cartlidge/Extender: [name] in Treatment: 0 Visit Information Patient Arrived: [name] Arrival Time: 10:20 Accompanied By: daughter Transfer Assistance: Manual Patient Identification Verified: Yes Secondary Verification Process Completed: Yes Patient Requires Transmission-Based Precautions: No Patient Has Alerts: Yes Patient Alerts: Patient on Blood Thinner Electronic Signature(s) Signed: 01/11/2023 4:23:23 PM By: Blanche East RN Entered By: Blanche East on 01/09/2023 10:22:42 Clinic Level of Care Assessment Details -------------------------------------------------------------------------------- Earl [name] ([id number]) [phone].pdf Page 2 of 13 Patient Name: Date of Service: TO [name], [place] [name] 01/09/2023 10:00 A M Medical  Record Number: [id number] Patient Account Number: [id number] Date of Birth/Sex: Treating RN: 1941-03-24 (82 y.o. [name] Primary Care Lauramae Kneisley: Kirtland Bouchard Other Clinician: Referring Lilah Mijangos: Treating Johnpaul Gillentine/Extender: Clemens Catholic Weeks in Treatment: 0 Clinic Level of Care Assessment Items TOOL 1 Quantity Score X- 1 0 Use when EandM and Procedure is performed on INITIAL visit ASSESSMENTS - Nursing Assessment / Reassessment X- 1 20 General Physical Exam (combine w/ comprehensive assessment (listed just below) when performed on new pt. evals) X- 1 25 Comprehensive Assessment (HX, ROS, Risk Assessments, Wounds Hx, etc.) ASSESSMENTS - Wound and Skin Assessment / Reassessment []$  - 0 Dermatologic / Skin Assessment (not related to wound area) ASSESSMENTS - Ostomy and/or Continence Assessment and Care []$  - 0 Incontinence Assessment and Management []$  - 0 Ostomy Care Assessment and Management (repouching, etc.) PROCESS - Coordination of Care X - Simple Patient / Family Education for ongoing care 1 15 []$  - 0 Complex (extensive) Patient / Family Education for ongoing care X- 1 10 Staff obtains Programmer, systems, Records, T Results / Process Orders est X- 1 10 Staff telephones HHA, Nursing Homes / Clarify orders / etc []$  - 0 Routine Transfer to another Facility (non-emergent condition) []$  - 0 Routine Hospital Admission (non-emergent condition) X- 1 15 New Admissions / Biomedical engineer / Ordering NPWT Apligraf, etc. , []$  - 0 Emergency Hospital Admission (emergent condition) PROCESS - Special Needs []$  - 0 Pediatric / Minor Patient Management []$  - 0 Isolation Patient Management []$  - 0 Hearing / Language / Visual special needs []$  - 0 Assessment of Community assistance (transportation, D/C planning, etc.) []$  - 0 Additional assistance / Altered mentation []$  - 0 Support Surface(s) Assessment (bed, cushion, seat, etc.) INTERVENTIONS - Miscellaneous []$   - 0 External ear exam []$  - 0 Patient Transfer (multiple staff / Civil Service fast streamer / Similar devices) []$  - 0 Simple Staple / Suture removal (25 or less) []$  - 0 Complex Staple / Suture removal (26 or more) []$  - 0 Hypo/Hyperglycemic Management (do not check if billed separately) X- 1 15 Ankle / Brachial Index (ABI) - do  not check if billed separately Has the patient been seen at the hospital within the last three years: Yes Total Score: 110 Level Of Care: New/Established - Level 3 Electronic Signature(s) Signed: 01/11/2023 4:23:23 PM By: Blanche East RN Entered By: Blanche East on 01/09/2023 11:21:01 Earl [name] ([id number].pdf Page 3 of 13 -------------------------------------------------------------------------------- Compression Therapy Details Patient Name: Date of Service: TO [name], [place] [name] 01/09/2023 10:00 A M Medical Record Number: [id number] Patient Account Number: [id number] Date of Birth/Sex: Treating RN: [birth year] (82 y.o. [name] Primary Care Andres Vest: Kirtland Bouchard Other Clinician: Referring Cyra Spader: Treating Cyrilla Durkin/Extender: Clemens Catholic Weeks in Treatment: 0 Compression Therapy Performed for Wound Assessment: Wound #1 Right,Medial Lower Leg Performed By: Clinician Blanche East, RN Compression Type: Three Layer Post Procedure Diagnosis Same as Pre-procedure Electronic Signature(s) Signed: 01/09/2023 11:37:19 AM By: Blanche East RN Entered By: Blanche East on 01/09/2023 11:37:19 -------------------------------------------------------------------------------- Compression Therapy Details Patient Name: Date of Service: TO [name], [place] [name] 01/09/2023 10:00 A M Medical Record Number: [id number] Patient Account Number: [id number] Date of Birth/Sex: Treating RN: Jun 11, 1941 (82 y.o. [name] Primary Care Keenan Dimitrov: Kirtland Bouchard Other Clinician: Referring Jourdain Guay: Treating  Niklaus Mamaril/Extender: Clemens Catholic Weeks in Treatment: 0 Compression Therapy Performed for Wound Assessment: Wound #2 Right,Anterior Lower Leg Performed By: Clinician Blanche East, RN Compression Type: Three Layer Post Procedure Diagnosis Same as Pre-procedure Electronic Signature(s) Signed: 01/09/2023 11:37:19 AM By: Blanche East RN Entered By: Blanche East on 01/09/2023 11:37:19 -------------------------------------------------------------------------------- Compression Therapy Details Patient Name: Date of Service: TO [name], NA [name] 01/09/2023 10:00 A M Medical Record Number: [id number] Patient Account Number: [id number] Date of Birth/Sex: Treating RN: May 26, 1941 (82 y.o. [name] Primary Care Analeia Ismael: Kirtland Bouchard Other Clinician: Referring Chandani Rogowski: Treating Amry Cathy/Extender: Clemens Catholic Weeks in Treatment: 0 Compression Therapy Performed for Wound Assessment: Wound #3 Right,Posterior Lower Leg Performed By: Clinician Blanche East, RN Compression Type: Three Layer Post Procedure Diagnosis Earl [name], Earl [name] ([id number]) [id number] Page 4 of 13 Same as Pre-procedure Electronic Signature(s) Signed: 01/09/2023 11:37:20 AM By: Blanche East RN Entered By: Blanche East on 01/09/2023 11:37:19 -------------------------------------------------------------------------------- Encounter Discharge Information Details Patient Name: Date of Service: TO [name], [place] [name] 01/09/2023 10:00 A M Medical Record Number: [id number] Patient Account Number: [id number] Date of Birth/Sex: Treating RN: 1941-05-12 (82 y.o. [name] Primary Care Ethelean Colla: Kirtland Bouchard Other Clinician: Referring Keela Rubert: Treating Jet Armbrust/Extender: Clemens Catholic Weeks in Treatment: 0 Encounter Discharge Information Items Post Procedure Vitals Discharge Condition: Stable Temperature (F): 97.6 Ambulatory  Status: Ambulatory Pulse (bpm): 78 Discharge Destination: Home Respiratory Rate (breaths/min): 18 Transportation: Private Auto Blood Pressure (mmHg): 105/65 Accompanied By: daughter Schedule Follow-up Appointment: Yes Clinical Summary of Care: Electronic Signature(s) Signed: 01/09/2023 11:39:30 AM By: Blanche East RN Entered By: Blanche East on 01/09/2023 11:39:30 -------------------------------------------------------------------------------- Lower Extremity Assessment Details Patient Name: Date of Service: TO [name], [place] [name] 01/09/2023 10:00 A M Medical Record Number: [id number] Patient Account Number: [id number] Date of Birth/Sex: Treating RN: 1941/10/29 (82 y.o. [name] Primary Care Messi Twedt: Kirtland Bouchard Other Clinician: Referring Neeley Sedivy: Treating Romain Erion/Extender: Clemens Catholic Weeks in Treatment: 0 Edema Assessment Assessed: [Left: No] [Right: No] [Left: Edema] [Right: :] Calf Left: Right: Point of Measurement: From Medial Instep 34 cm Ankle Left: Right: Point of Measurement: From Medial Instep 24.2 cm Vascular Assessment Pulses: Dorsalis Pedis Palpable: [Right:Yes] Doppler Audible: [Right:Yes] Earl [name], Earl [name] ([id number]) [id number].pdf Page 5 of 13]  Blood Pressure: Brachial: [Right:105] Ankle: [Right:Dorsalis Pedis: 140 1.33] Electronic Signature(s) Signed: 01/11/2023 4:23:23 PM By: Blanche East RN Entered By: Blanche East on 01/09/2023 10:45:14 -------------------------------------------------------------------------------- Multi Wound Chart Details Patient Name: Date of Service: TO [name], [place] [name] 01/09/2023 10:00 A M Medical Record Number: [id number] Patient Account Number: [id number] Date of Birth/Sex: Treating RN: May 02, 1941 (82 y.o. M) Primary Care Constanza Mincy: Kirtland Bouchard Other Clinician: Referring Stace Peace: Treating Joelee Snoke/Extender: Clemens Catholic Weeks in  Treatment: 0 Vital Signs Height(in): 71 Pulse(bpm): 73 Weight(lbs): 151 Blood Pressure(mmHg): 105/65 Body Mass Index(BMI): 21.1 Temperature(F): Respiratory Rate(breaths/min): 18 [1:Photos:] Right, Medial Lower Leg Right, Anterior Lower Leg Right, Posterior Lower Leg Wound Location: Blister Blister Blister Wounding Event: Diabetic Wound/Ulcer of the Lower Diabetic Wound/Ulcer of the Lower Diabetic Wound/Ulcer of the Lower Primary Etiology: Extremity Extremity Extremity Arrhythmia, Congestive Heart Failure, Arrhythmia, Congestive Heart Failure, Arrhythmia, Congestive Heart Failure, Comorbid History: Hypertension, Type II Diabetes Hypertension, Type II Diabetes Hypertension, Type II Diabetes 11/23/2022 11/23/2022 11/23/2022 Date Acquired: 0 0 0 Weeks of Treatment: Open Open Open Wound Status: No No No Wound Recurrence: 0.3x0.3x0.1 14.2x7.2x0.1 7x4.8x0.1 Measurements L x W x D (cm) 0.071 80.299 26.389 A (cm) : rea 0.007 8.03 2.639 Volume (cm) : Grade 1 Grade 1 Grade 1 Classification: Medium Medium Medium Exudate A mount: Serous Serous Serous Exudate Type: amber amber amber Exudate Color: Medium (34-66%) Medium (34-66%) Medium (34-66%) Granulation A mount: Pink Pink Pink Granulation Quality: Medium (34-66%) Medium (34-66%) Medium (34-66%) Necrotic A mount: Small (1-33%) Small (1-33%) N/A Epithelialization: N/A Debridement - Selective/Open Wound Debridement - Selective/Open Wound Debridement: Pre-procedure Verification/Time Out N/A 11:10 11:10 Taken: N/A Lidocaine 4% Topical Solution Lidocaine 4% Topical Solution Pain Control: N/A [employer] Tissue Debrided: N/A Non-Viable Tissue Non-Viable Tissue Level: N/A 0.4 31.5 Debridement A (sq cm): rea N/A Curette Curette Instrument: N/A Minimum Minimum Bleeding: N/A Pressure Pressure Hemostasis A chieved: N/A Procedure was tolerated well Procedure was tolerated well Debridement Treatment Response: N/A  14.2x7.2x0.1 7x4.8x0.1 Post Debridement Measurements L x W x D (cm) Earl [name] ([id number]) [id number].pdf Page 6 of 13 N/A 8.03 2.639 Post Debridement Volume: (cm) No Abnormalities Noted No Abnormalities Noted No Abnormalities Noted Periwound Skin Texture: Maceration: Yes Maceration: Yes Periwound Skin Moisture: Dry/Scaly: No Dry/Scaly: No No Abnormalities Noted Rubor: Yes Rubor: Yes Periwound Skin Color: N/A No Abnormality No Abnormality Temperature: N/A Yes Yes Tenderness on Palpation: N/A Debridement Debridement Procedures Performed: Treatment Notes Electronic Signature(s) Signed: 01/09/2023 11:16:37 AM By: Fredirick Maudlin MD FACS Entered By: Fredirick Maudlin on 01/09/2023 11:16:37 -------------------------------------------------------------------------------- Multi-Disciplinary Care Plan Details Patient Name: Date of Service: TO [name], [place] [name] 01/09/2023 10:00 A M Medical Record Number: [id number] Patient Account Number: [id number] Date of Birth/Sex: Treating RN: 08-23-1941 (82 y.o. [name] Primary Care Shadi Sessler: Kirtland Bouchard Other Clinician: Referring Si Jachim: Treating Devyon Keator/Extender: Clemens Catholic Weeks in Treatment: 0 Active Inactive Nutrition Nursing Diagnoses: Potential for alteratiion in Nutrition/Potential for imbalanced nutrition Goals: Patient/caregiver verbalizes understanding of need to maintain therapeutic glucose control per primary care physician Date Initiated: 01/09/2023 Target Resolution Date: 02/16/2023 Goal Status: Active Interventions: Provide education on elevated blood sugars and impact on wound healing Provide education on nutrition Treatment Activities: Dietary management education, guidance and counseling : 01/09/2023 Giving encouragement to exercise : 01/09/2023 Special diet education : 01/09/2023 Notes: Orientation to the Wound Care Program Nursing  Diagnoses: Knowledge deficit related to the wound healing center program Goals: Patient/caregiver will verbalize understanding of the [facility]  Date Initiated: 01/09/2023 Target Resolution Date: 01/25/2023 Goal Status: Active Interventions: Provide education on orientation to the wound center Notes: Wound/Skin Impairment Nursing Diagnoses: Knowledge deficit related to ulceration/compromised skin integrity Earl [name], Earl [name] ([id number]) [phone].pdf Page 7 of 13 Goals: Ulcer/skin breakdown will have a volume reduction of 30% by week 4 Date Initiated: 01/09/2023 Target Resolution Date: 02/15/2023 Goal Status: Active Interventions: Assess ulceration(s) every visit Notes: Electronic Signature(s) Signed: 01/11/2023 4:23:23 PM By: Blanche East RN Entered By: Blanche East on 01/09/2023 11:19:40 -------------------------------------------------------------------------------- Pain Assessment Details Patient Name: Date of Service: TO [name], [place] [name] 01/09/2023 10:00 A M Medical Record Number: [id number] Patient Account Number: [id number] Date of Birth/Sex: Treating RN: February 09, 1941 (82 y.o. [name] Primary Care Asim Gersten: Kirtland Bouchard Other Clinician: Referring Yeraldi Fidler: Treating Destan Franchini/Extender: Clemens Catholic Weeks in Treatment: 0 Active Problems Location of Pain Severity and Description of Pain Patient Has Paino Yes Site Locations Rate the pain. Current Pain Level: 2 Character of Pain Describe the Pain: Burning Pain Management and Medication Current Pain Management: Electronic Signature(s) Signed: 01/11/2023 4:23:23 PM By: Blanche East RN Entered By: Blanche East on 01/09/2023 11:04:25 Earl [name] ([id number]) [phone].pdf Page 8 of 13 -------------------------------------------------------------------------------- Patient/Caregiver Education Details Patient Name: Date  of Service: TO [name], [place] [name] 2/19/2024andnbsp10:00 A M Medical Record Number: [id number] Patient Account Number: [id number] Date of Birth/Gender: Treating RN: 1940/12/02 (82 y.o. [name] Primary Care Physician: Kirtland Bouchard Other Clinician: Referring Physician: Treating Physician/Extender: [name] in Treatment: 0 Education Assessment Education Provided To: Patient Education Topics Provided Elevated Blood Sugar/ Impact on Healing: Handouts: Elevated Blood Sugars: How Do They Affect Wound Healing Methods: Explain/Verbal Responses: Reinforcements needed, State content correctly Wound Debridement: Handouts: Wound Debridement Methods: Explain/Verbal Responses: Reinforcements needed, State content correctly Wound/Skin Impairment: Handouts: Caring for Your Ulcer Methods: Explain/Verbal Responses: Reinforcements needed, State content correctly Electronic Signature(s) Signed: 01/11/2023 4:23:23 PM By: Blanche East RN Entered By: Blanche East on 01/09/2023 11:20:10 -------------------------------------------------------------------------------- Wound Assessment Details Patient Name: Date of Service: TO [name], [place] [name] 01/09/2023 10:00 A M Medical Record Number: [id number] Patient Account Number: [id number] Date of Birth/Sex: Treating RN: 26-Jul-1941 (82 y.o. [name] Primary Care Kiley Solimine: Kirtland Bouchard Other Clinician: Referring Kavaughn Faucett: Treating Arlee Bossard/Extender: Clemens Catholic Weeks in Treatment: 0 Wound Status Wound Number: 1 Primary Diabetic Wound/Ulcer of the Lower Extremity Etiology: Wound Location: Right, Medial Lower Leg Wound Status: Open Wounding Event: Blister Comorbid Arrhythmia, Congestive Heart Failure, Hypertension, Type II Date Acquired: 11/23/2022 History: Diabetes Weeks Of Treatment: 0 Clustered Wound: No Photos Earl [name], Earl [name] ([id number]) [id number]  Page 9 of 13 Wound Measurements Length: (cm) 0.3 Width: (cm) 0.3 Depth: (cm) 0.1 Area: (cm) 0.071 Volume: (cm) 0.007 % Reduction in Area: % Reduction in Volume: Epithelialization: Small (1-33%) Tunneling: No Undermining: No Wound Description Classification: Grade 1 Exudate Amount: Medium Exudate Type: Serous Exudate Color: amber Foul Odor After Cleansing: No Slough/Fibrino Yes Wound Bed Granulation Amount: Medium (34-66%) Granulation Quality: Pink Necrotic Amount: Medium (34-66%) Necrotic Quality: Adherent Slough Periwound Skin Texture Texture Color No Abnormalities Noted: Yes No Abnormalities Noted: Yes Moisture No Abnormalities Noted: No Dry / Scaly: No Maceration: Yes Treatment Notes Wound #1 (Lower Leg) Wound Laterality: Right, Medial Cleanser Soap and Water Discharge Instruction: May shower and wash wound with dial antibacterial soap and water prior to dressing change. Wound Cleanser Discharge Instruction: Cleanse the wound with wound cleanser prior to applying a clean dressing using gauze sponges, not tissue  or cotton balls. Peri-Wound Care Zinc Oxide Ointment 30g tube Discharge Instruction: Apply Zinc Oxide to periwound with each dressing change Topical Primary Dressing Sorbalgon AG Dressing, 4x4 (in/in) Discharge Instruction: Apply to wound bed as instructed Secondary Dressing ABD Pad, 8x10 Discharge Instruction: Apply over primary dressing as directed. Secured With Transpore Surgical Tape, 2x10 (in/yd) Discharge Instruction: Secure dressing with tape as directed. Compression Wrap Urgo K2 Lite, two layer compression system, regular Discharge Instruction: Apply Urgo K2 Lite as directed (alternative to 3 layer compression). Compression Stockings Earl [name], Earl [name] ([id number]) [id number] Page 10 of 13 Add-Ons Electronic Signature(s) Signed: 01/11/2023 4:23:23 PM By: Blanche East RN Entered By: Blanche East on 01/09/2023  10:57:28 -------------------------------------------------------------------------------- Wound Assessment Details Patient Name: Date of Service: TO [name], [place] [name] 01/09/2023 10:00 A M Medical Record Number: [id number] Patient Account Number: [id number] Date of Birth/Sex: Treating RN: 1941-06-02 (82 y.o. [name] Primary Care Samaira Holzworth: Kirtland Bouchard Other Clinician: Referring Sheron Tallman: Treating Annamary Buschman/Extender: Clemens Catholic Weeks in Treatment: 0 Wound Status Wound Number: 2 Primary Diabetic Wound/Ulcer of the Lower Extremity Etiology: Wound Location: Right, Anterior Lower Leg Wound Status: Open Wounding Event: Blister Comorbid Arrhythmia, Congestive Heart Failure, Hypertension, Type II Date Acquired: 11/23/2022 History: Diabetes Weeks Of Treatment: 0 Clustered Wound: No Photos Wound Measurements Length: (cm) 14.2 Width: (cm) 7.2 Depth: (cm) 0.1 Area: (cm) 80.299 Volume: (cm) 8.03 % Reduction in Area: % Reduction in Volume: Epithelialization: Small (1-33%) Tunneling: No Wound Description Classification: Grade 1 Exudate Amount: Medium Exudate Type: Serous Exudate Color: amber Foul Odor After Cleansing: No Slough/Fibrino Yes Wound Bed Granulation Amount: Medium (34-66%) Exposed Structure Granulation Quality: Pink Fascia Exposed: No Necrotic Amount: Medium (34-66%) Fat Layer (Subcutaneous Tissue) Exposed: Yes Necrotic Quality: Adherent Slough Tendon Exposed: No Muscle Exposed: No Joint Exposed: No Bone Exposed: No Periwound Skin Texture Texture Color No Abnormalities Noted: Yes No Abnormalities Noted: No Rubor: Yes Moisture No Abnormalities Noted: No Temperature / Pain Dry / Scaly: No Temperature: No Abnormality Earl [name], Earl [name] ([id number]) [phone].pdf Page 11 of 13 Maceration: Yes Tenderness on Palpation: Yes Treatment Notes Wound #2 (Lower Leg) Wound Laterality: Right, Anterior Cleanser Soap  and Water Discharge Instruction: May shower and wash wound with dial antibacterial soap and water prior to dressing change. Wound Cleanser Discharge Instruction: Cleanse the wound with wound cleanser prior to applying a clean dressing using gauze sponges, not tissue or cotton balls. Peri-Wound Care Zinc Oxide Ointment 30g tube Discharge Instruction: Apply Zinc Oxide to periwound with each dressing change Topical Primary Dressing Sorbal[employer] AG Dressing, 4x4 (in/in) Discharge Instruction: Apply to wound bed as instructed Secondary Dressing ABD Pad, 8x10 Discharge Instruction: Apply over primary dressing as directed. Secured With Transpore Surgical Tape, 2x10 (in/yd) Discharge Instruction: Secure dressing with tape as directed. Compression Wrap Urgo K2 Lite, two layer compression system, regular Discharge Instruction: Apply Urgo K2 Lite as directed (alternative to 3 layer compression). Compression Stockings Add-Ons Electronic Signature(s) Signed: 01/11/2023 4:23:23 PM By: Blanche East RN Entered By: Blanche East on 01/09/2023 10:57:49 -------------------------------------------------------------------------------- Wound Assessment Details Patient Name: Date of Service: TO [name], [place] [name] 01/09/2023 10:00 A M Medical Record Number: [id number] Patient Account Number: [id number] Date of Birth/Sex: Treating RN: 10-07-1941 (82 y.o. [name] Primary Care Neeley Sedivy: Kirtland Bouchard Other Clinician: Referring Lakoda Raske: Treating Talasia Saulter/Extender: Clemens Catholic Weeks in Treatment: 0 Wound Status Wound Number: 3 Primary Diabetic Wound/Ulcer of the Lower Extremity Etiology: Wound Location: Right, Posterior Lower Leg Wound Status: Open Wounding Event: Blister  Comorbid Arrhythmia, Congestive Heart Failure, Hypertension, Type II Date Acquired: 11/23/2022 History: Diabetes Weeks Of Treatment: 0 Clustered Wound: No Photos Earl [name], Earl [name] ([id number])  [id number] Page 12 of 13 Wound Measurements Length: (cm) 7 Width: (cm) 4.8 Depth: (cm) 0.1 Area: (cm) 26.389 Volume: (cm) 2.639 % Reduction in Area: % Reduction in Volume: Tunneling: No Undermining: No Wound Description Classification: Grade 1 Exudate Amount: Medium Exudate Type: Serous Exudate Color: amber Foul Odor After Cleansing: No Slough/Fibrino Yes Wound Bed Granulation Amount: Medium (34-66%) Granulation Quality: Pink Necrotic Amount: Medium (34-66%) Necrotic Quality: Adherent Slough Periwound Skin Texture Texture Color No Abnormalities Noted: Yes No Abnormalities Noted: No Rubor: Yes Moisture No Abnormalities Noted: No Temperature / Pain Temperature: No Abnormality Tenderness on Palpation: Yes Treatment Notes Wound #3 (Lower Leg) Wound Laterality: Right, Posterior Cleanser Soap and Water Discharge Instruction: May shower and wash wound with dial antibacterial soap and water prior to dressing change. Wound Cleanser Discharge Instruction: Cleanse the wound with wound cleanser prior to applying a clean dressing using gauze sponges, not tissue or cotton balls. Peri-Wound Care Zinc Oxide Ointment 30g tube Discharge Instruction: Apply Zinc Oxide to periwound with each dressing change Topical Primary Dressing [employer] Dressing, 4x4 (in/in) Discharge Instruction: Apply to wound bed as instructed Secondary Dressing ABD Pad, 8x10 Discharge Instruction: Apply over primary dressing as directed. Secured With Transpore Surgical Tape, 2x10 (in/yd) Discharge Instruction: Secure dressing with tape as directed. Compression Wrap Urgo K2 Lite, two layer compression system, regular Discharge Instruction: Apply Urgo K2 Lite as directed (alternative to 3 layer compression). Compression Stockings Earl [name], Earl [name] ([id number]) [id number] Page 13 of 13 Add-Ons Electronic Signature(s) Signed: 01/11/2023 4:23:23 PM  By: Blanche East RN Entered By: Blanche East on 01/09/2023 10:58:31 -------------------------------------------------------------------------------- Vitals Details Patient Name: Date of Service: TO [name], NA [name] 01/09/2023 10:00 A M Medical Record Number: [id number] Patient Account Number: [id number] Date of Birth/Sex: Treating RN: [birth year] (82 y.o. [name] Primary Care Zayne Draheim: Kirtland Bouchard Other Clinician: Referring Xzavier Swinger: Treating Brittainy Bucker/Extender: Clemens Catholic Weeks in Treatment: 0 Vital Signs Time Taken: 10:22 Pulse (bpm): 78 Height (in): 71 Respiratory Rate (breaths/min): 18 Source: Stated Blood Pressure (mmHg): 105/65 Weight (lbs): 151 Reference Range: 80 - 120 mg / dl Source: Stated Body Mass Index (BMI): 21.1 Electronic Signature(s) Signed: 01/11/2023 4:23:23 PM By: Blanche East RN Entered By: Blanche East on 01/09/2023 10:25:19

## 2023-01-12 NOTE — Progress Notes (Signed)
[name], Earl ([id number]) [id number] Page 1 of 3 Visit Report for 01/09/2023 Abuse Risk Screen Details Patient Name: Date of Service: TO Earl [name], [place] Earl [name] 01/09/2023 10:00 A M Medical Record Number: [id number] Patient Account Number: [id number] Date of Birth/Sex: Treating RN: 1941-10-16 (82 y.o. Earl [name] Primary Care [name]: Kirtland Bouchard Other Clinician: Referring Doreena Maulden: Treating [name] Abuse Risk Screen Items Answer ABUSE RISK SCREEN: Has anyone close to you tried to hurt or harm you recentlyo No Do you feel uncomfortable with anyone in your familyo No Has anyone forced you do things that you didnt want to doo No Electronic Signature(s) Signed: 01/11/2023 4:23:23 PM By: Blanche East RN Entered By: Blanche East on 01/09/2023 10:31:42 -------------------------------------------------------------------------------- Activities of Daily Living Details Patient Name: Date of Service: TO Earl [name] Earl [name] 01/09/2023 10:00 A M Medical Record Number: [id number] Patient Account Number: [id number] Date of Birth/Sex: Treating RN: 1941/05/19 (82 y.o. Earl [name] Primary Care Vicente Weidler: Kirtland Bouchard Other Clinician: Referring [name]: Treating [name] Activities of Daily Living Items Answer Activities of Daily Living (Please select one for each item) Drive Automobile Need Assistance T Medications ake Need Assistance Use T elephone Completely Able Care for Appearance Completely Able Use T oilet Completely Able Bath / Shower Completely Able Dress Self Completely Able Feed Self Completely Able Walk Need Assistance Get In / Out Bed Need Assistance Housework Need Assistance Prepare Meals Need Assistance Handle Money Need Assistance Shop for Self Need Assistance Electronic Signature(s) Signed:  01/11/2023 4:23:23 PM By: Blanche East RN Entered By: Blanche East on 01/09/2023 10:32:25 [name] -------------------------------------------------------------------------------- Education Screening Details Patient Name: Date of Service: TO Earl [name], [place] Earl [name] 01/09/2023 10:00 A M Medical Record Number: [id number] Patient Account Number: [id number] Date of Birth/Sex: Treating RN: 03/03/1941 (82 y.o. Earl [name] Primary Care [name]: Kirtland Bouchard Other Clinician: Referring [name]: Treating [name] in Treatment: 0 Primary Learner Assessed: Patient Learning Preferences/Education Level/Primary Language Learning Preference: Explanation Highest Education Level: College or Above Preferred Language: English Cognitive Barrier Language Barrier: Yes Translator Needed: Yes Memory Deficit: Yes Emotional Barrier: Yes Cultural/Religious Beliefs Affecting Medical Care: Yes Physical Barrier Impaired Vision: Yes Glasses Impaired Hearing: No Decreased Hand dexterity: Yes Knowledge/Comprehension Knowledge Level: High Comprehension Level: High Ability to understand written instructions: High Ability to understand verbal instructions: High Motivation Anxiety Level: Calm Cooperation: Cooperative Education Importance: Acknowledges Need Interest in Health Problems: Asks Questions Perception: Coherent Willingness to Engage in Self-Management High Activities: Readiness to Engage in Self-Management High Activities: Electronic Signature(s) Signed: 01/11/2023 4:23:23 PM By: Blanche East RN Entered By: Blanche East on 01/09/2023 10:33:09 -------------------------------------------------------------------------------- Fall Risk Assessment Details Patient Name: Date of Service: TO Earl [name], NA Earl [name] 01/09/2023 10:00 A M Medical Record Number:  [id number] Patient Account Number: [id number] Date of Birth/Sex: Treating RN: [birth year] (82 y.o. Earl [name] Primary Care [name]: Kirtland Bouchard Other Clinician: Referring [name]: Treating [name] Fall Risk Assessment Items Have you had 2 or more falls in the last 3 [address] [name], [name] ([id number]) [id number] Page 3 of 3 Have you had any fall that resulted in injury in the last 12 monthso 0 No FALLS RISK SCREEN History of falling - immediate or within 3 months 0 No Secondary diagnosis (Do you have 2  or more medical diagnoseso) 0 No Ambulatory aid None/bed rest/wheelchair/nurse 0 No Crutches/cane/walker 15 Yes Furniture 0 No Intravenous therapy Access/Saline/Heparin Lock 0 No Gait/Transferring Normal/ bed rest/ wheelchair 0 No Weak (short steps with or without shuffle, stooped but able to lift head while walking, may seek 10 Yes support from furniture) Impaired (short steps with shuffle, may have difficulty arising from chair, head down, impaired 0 No balance) Mental Status Oriented to own ability 0 Yes Electronic Signature(s) Signed: 01/11/2023 4:23:23 PM By: Blanche East RN Entered By: Blanche East on 01/09/2023 10:33:38 -------------------------------------------------------------------------------- Nutrition Risk Screening Details Patient Name: Date of Service: TO Earl [name] Earl [name] 01/09/2023 10:00 A M Medical Record Number: [id number] Patient Account Number: [id number] Date of Birth/Sex: Treating RN: 09/05/1941 (82 y.o. Earl [name] Primary Care [name]: Kirtland Bouchard Other Clinician: Referring [name]: Treating [name] Height (in): 71 Weight (lbs): 151 Body Mass Index (BMI): 21.1 Nutrition Risk Screening Items Score Screening NUTRITION RISK SCREEN: I have an illness or  condition that made me change the kind and/or amount of food I eat 0 No I eat fewer than two meals per day 0 No I eat few fruits and vegetables, or milk products 0 No I have three or more drinks of beer, liquor or wine almost every day 0 No I have tooth or mouth problems that make it hard for me to eat 0 No I don't always have enough money to buy the food I need 0 No I eat alone most of the time 0 No I take three or more different prescribed or over-the-counter drugs a day 1 Yes Without wanting to, I have lost or gained 10 pounds in the last six months 2 Yes I am not always physically able to shop, cook and/or feed myself 0 No Nutrition Protocols Good Risk Protocol Provide education on elevated blood Moderate Risk Protocol 0 sugars and impact on wound healing, as applicable High Risk Proctocol Risk Level: Moderate Risk Score: 3 Electronic Signature(s) Signed: 01/11/2023 4:23:23 PM By: Blanche East RN Entered By: Blanche East on 01/09/2023 10:34:10

## 2023-01-13 ENCOUNTER — Telehealth: Payer: Self-pay | Admitting: Internal Medicine

## 2023-01-13 NOTE — Progress Notes (Signed)
  Chronic Care Management   Note  01/13/2023 Name: Earl [name] MRN: [id number] DOB: 1941-07-26  Earl [name] is a 82 y.o. year old male who is a primary care patient of [name], MD. I reached out to [name] by phone today in response to a referral sent by Earl [name] PCP, [name], MD.   Earl [name] was given information about Chronic Care Management services today including:  CCM service includes personalized support from designated clinical staff supervised by his physician, including individualized plan of care and coordination with other care providers 24/7 contact phone numbers for assistance for urgent and routine care needs. Service will only be billed when office clinical staff spend 20 minutes or more in a month to coordinate care. Only one practitioner may furnish and bill the service in a calendar month. The patient may stop CCM services at any time (effective at the end of the month) by phone call to the office staff.   Patient agreed to services and verbal consent obtained.   Follow up [place]

## 2023-01-14 NOTE — Progress Notes (Signed)
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  Genetic Consultation Notes  Earl [name] (II.3 on pedigree), a 82 year old African American gentleman is here today with his daughter, Earl [name].  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   I informed him that this variant has been reported in several patients with cardiac amyloidosis and is more prevalent amongst African Americans. Patients harboring the TTR V142I variant typically develop cardiac symptoms after the age of 39. About 3% to 3.9% of African Americans are heterozygous for V142I pathogenic variant. Individuals with this pathogenic variant are known to have heart failure in their 81s.   I explained that a mutation in TTR gene destabilizes the protein causing it to misfold resulting in amyloid deposits in different tissues, such as the peripheral nervous system, brain and heart. Amyloid deposits in these tissues can lead to numbness in the lower limbs, feet and hands, stroke or left ventricular hypertrophy. They verbalized understanding of this and tell me that he does have neuropathy and at age 8, carpal tunnel syndrome.    Treatment options are available to stabilize the TTR tetramer and were also briefly reviewed. They report that he is on Vyndamax (tafamidis), a drug that stabilizes the TTR tetramer thus preventing it from forming fibrils that are stored in cells.  We also discussed the genetics of hereditary transthyretin amyloidosis (HTA), namely inheritance, incomplete penetrance and variable expression that is seen in patients with cardiac amyloidosis due to mutations in TTR gene. I  explained to them that as this is an autosomal dominant condition, and if he inherited it from one of his parents, then his children are at a 50% risk of harboring the TTR V142I pathogenic variant.   Family History Details in table below-  Relation to Proband Pedigree # Current age Heart condition/age of onset Notes  Daughter III.6 51 PE @ 36 Sickle cell carrier  Son III.7 50 None   Grandkids IV.1-IV.2 21, 16 None   Brother-1 II.1 Deceased None Died of poor health @ 19  Brother-2 II.2 Deceased None Died @ 14- of ?  Nephews, Nieces III.1-III.5 64s None         Father I.1 Deceased  Died of cancer @ 52  Mother I.2 Deceased  Sudden cardiac arrest @ 64   Impressions and Plan  As the TTR V142I variant is a common pathogenic variant among the African Americans, it is highly likely that he inherited this mutation from one of his parents. He has two living children and both are at a 50% risk of having the TTR V142I mutation. As hereditary transthyretin-mediated amyloidosis-hATTR is a condition associated with late age of onset that is progressively debilitating, it is recommended that his children and that his deceased brother undergo genetic testing for the familial pathogenic variant to determine their risk for cardiac amyloidosis so as to reduce morbidity and mortality by early diagnosis and treatment. This will help direct appropriate surveillance, treatment and subsequent management of cardiac amyloidosis.   In addition, we discussed the protections afforded by the Genetic Information Non-Discrimination Act (GINA). I explained to them that GINA protects him from losing his employment and health insurance based on his genotype. However,  these protections do not cover life insurance and disability. They verbalized understanding of this and have life insurance in place.   Please note that the patient has not been counseled in this visit on personal, cultural or ethical issues that he may face due to  his heart condition.   Plan His daughter will seek referral at [facility] and, son who lives in [place] plan to to meet up with cardiology and proceed with genetic testing of the familial TTR V142I pathogenic variant. Meanwhile, emphasized that Earl [name] continues with his treatment and management strategy to improve his quality of life.   Lattie Corns, Ph.D, [facility] Clinical Molecular Geneticist

## 2023-01-17 ENCOUNTER — Other Ambulatory Visit (HOSPITAL_COMMUNITY): Payer: Self-pay

## 2023-01-18 ENCOUNTER — Encounter (HOSPITAL_BASED_OUTPATIENT_CLINIC_OR_DEPARTMENT_OTHER): Payer: Medicare Other | Admitting: General Surgery

## 2023-01-18 DIAGNOSIS — N182 Chronic kidney disease, stage 2 (mild): Secondary | ICD-10-CM | POA: Diagnosis not present

## 2023-01-18 DIAGNOSIS — E1122 Type 2 diabetes mellitus with diabetic chronic kidney disease: Secondary | ICD-10-CM | POA: Diagnosis not present

## 2023-01-18 DIAGNOSIS — L97812 Non-pressure chronic ulcer of other part of right lower leg with fat layer exposed: Secondary | ICD-10-CM | POA: Diagnosis not present

## 2023-01-18 DIAGNOSIS — I6930 Unspecified sequelae of cerebral infarction: Secondary | ICD-10-CM | POA: Diagnosis not present

## 2023-01-18 DIAGNOSIS — L97811 Non-pressure chronic ulcer of other part of right lower leg limited to breakdown of skin: Secondary | ICD-10-CM | POA: Diagnosis not present

## 2023-01-18 DIAGNOSIS — I5022 Chronic systolic (congestive) heart failure: Secondary | ICD-10-CM | POA: Diagnosis not present

## 2023-01-18 DIAGNOSIS — E11622 Type 2 diabetes mellitus with other skin ulcer: Secondary | ICD-10-CM | POA: Diagnosis not present

## 2023-01-18 DIAGNOSIS — I130 Hypertensive heart and chronic kidney disease with heart failure and stage 1 through stage 4 chronic kidney disease, or unspecified chronic kidney disease: Secondary | ICD-10-CM | POA: Diagnosis not present

## 2023-01-18 DIAGNOSIS — I872 Venous insufficiency (chronic) (peripheral): Secondary | ICD-10-CM | POA: Diagnosis not present

## 2023-01-18 DIAGNOSIS — L97212 Non-pressure chronic ulcer of right calf with fat layer exposed: Secondary | ICD-10-CM | POA: Diagnosis not present

## 2023-01-19 NOTE — Progress Notes (Signed)
BYRLE, GOBEN ([id number]) [id number] Page 1 of 10 Visit Report for 01/18/2023 Chief Complaint Document Details Patient Name: Date of Service: TO Earl [name], [place] Earl [name] 01/18/2023 12:45 PM Medical Record Number: [id number] Patient Account Number: [id number] Date of Birth/Sex: Treating RN: 1941/10/28 (82 y.o. M) Primary Care Provider: Kirtland Bouchard Other Clinician: Referring Provider: Treating Provider/Extender: Collie Siad in Treatment: 1 Information Obtained from: Patient Chief Complaint Patient presents for treatment of an open ulcer due to venous insufficiency Electronic Signature(s) Signed: 01/18/2023 1:30:51 PM By: Fredirick Maudlin MD FACS Entered By: Fredirick Maudlin on 01/18/2023 13:30:51 -------------------------------------------------------------------------------- Debridement Details Patient Name: Date of Service: TO Earl [name], NA Earl [name] 01/18/2023 12:45 PM Medical Record Number: [id number] Patient Account Number: [id number] Date of Birth/Sex: Treating RN: 1941/05/11 (82 y.o. [name] Primary Care Provider: Kirtland Bouchard Other Clinician: Referring Provider: Treating Provider/Extender: Collie Siad in Treatment: 1 Debridement Performed for Assessment: Wound #2 Right,Anterior Lower Leg Performed By: Physician Fredirick Maudlin, MD Debridement Type: Debridement Severity of Tissue Pre Debridement: Fat layer exposed Level of Consciousness (Pre-procedure): Awake and Alert Pre-procedure Verification/Time Out Yes - 13:12 Taken: Start Time: 13:12 Pain Control: Lidocaine 5% topical ointment T Area Debrided (L x W): otal 6 (cm) x 1.5 (cm) = 9 (cm) Tissue and other material debrided: Non-Viable, Eschar, Slough, Slough Level: Non-Viable Tissue Debridement Description: Selective/Open Wound Instrument: Curette Bleeding: Minimum Hemostasis Achieved: Pressure Procedural Pain:  0 Post Procedural Pain: 0 Response to Treatment: Procedure was tolerated well Level of Consciousness (Post- Awake and Alert procedure): Post Debridement Measurements of Total Wound Length: (cm) 6 Width: (cm) 1.5 Depth: (cm) 0.1 Volume: (cm) 0.707 Character of Wound/Ulcer Post Debridement: Requires Further Debridement Severity of Tissue Post Debridement: Fat layer exposed Post Procedure Diagnosis Same as Pre-procedure Notes Scribed for Dr. Celine Ahr by Blanche East, RN Electronic Signature(s) Signed: 01/18/2023 4:01:42 PM By: Blanche East RN Earl [name] ([id number]) [id number] Page 2 of 10 Signed: 01/18/2023 4:13:19 PM By: Fredirick Maudlin MD FACS Entered By: Blanche East on 01/18/2023 13:17:32 -------------------------------------------------------------------------------- Debridement Details Patient Name: Date of Service: TO Earl [name], NA Earl [name] 01/18/2023 12:45 PM Medical Record Number: [id number] Patient Account Number: [id number] Date of Birth/Sex: Treating RN: 02-02-1941 (82 y.o. [name] Primary Care Provider: Kirtland Bouchard Other Clinician: Referring Provider: Treating Provider/Extender: Collie Siad in Treatment: 1 Debridement Performed for Assessment: Wound #3 Right,Posterior Lower Leg Performed By: Physician Fredirick Maudlin, MD Debridement Type: Debridement Severity of Tissue Pre Debridement: Fat layer exposed Level of Consciousness (Pre-procedure): Awake and Alert Pre-procedure Verification/Time Out Yes - 13:12 Taken: Start Time: 13:12 Pain Control: Lidocaine 5% topical ointment T Area Debrided (L x W): otal 5 (cm) x 1 (cm) = 5 (cm) Tissue and other material debrided: Non-Viable, Eschar, Slough, Slough Level: Non-Viable Tissue Debridement Description: Selective/Open Wound Instrument: Curette Bleeding: Minimum Hemostasis Achieved: Pressure Procedural Pain: 0 Post Procedural Pain:  0 Response to Treatment: Procedure was tolerated well Level of Consciousness (Post- Awake and Alert procedure): Post Debridement Measurements of Total Wound Length: (cm) 6 Width: (cm) 2 Depth: (cm) 0.1 Volume: (cm) 0.942 Character of Wound/Ulcer Post Debridement: Requires Further Debridement Severity of Tissue Post Debridement: Fat layer exposed Post Procedure Diagnosis Same as Pre-procedure Notes Scribed for Dr. Celine Ahr by Blanche East, RN Electronic Signature(s) Signed: 01/18/2023 4:01:42 PM By: Blanche East RN Signed: 01/18/2023 4:13:19 PM By: Fredirick Maudlin MD FACS Entered By: Blanche East on 01/18/2023 13:26:10 -------------------------------------------------------------------------------- HPI Details Patient  Name: Date of Service: TO Earl [name] Earl [name] 01/18/2023 12:45 PM Medical Record Number: [id number] Patient Account Number: [id number] Date of Birth/Sex: Treating RN: April 24, 1941 (82 y.o. 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 Near the beginning of the year, he had increased swelling in his legs and developed an ulcer on his right calf. Apparently this has been treated with Betadine sugar dressings per his PCPs office. NAGUAN, MAZON ([id number]) [id number] Page 3 of 10 His right leg has multiple superficial open ulcers that all look like they started with blisters. There is slough accumulation in the posterior calf ulcer. He has a palpable pedal pulse. The skin of both legs is tight and shiny. 01/18/2023: The silver alginate that we applied last week has completely stuck down to all of the open  wound sites. Edema control is improved. Electronic Signature(s) Signed: 01/18/2023 1:31:37 PM By: Fredirick Maudlin MD FACS Entered By: Fredirick Maudlin on 01/18/2023 13:31:37 -------------------------------------------------------------------------------- Physical Exam Details Patient Name: Date of Service: TO Earl [name], NA Earl [name] 01/18/2023 12:45 PM Medical Record Number: [id number] Patient Account Number: [id number] Date of Birth/Sex: Treating RN: 17-Jan-1941 (82 y.o. M) Primary Care Provider: Kirtland Bouchard Other Clinician: Referring Provider: Treating Provider/Extender: Earl [name] in Treatment: 1 Constitutional . . . . no acute distress. Respiratory Normal work of breathing on room air. Notes 01/18/2023: The silver alginate that we applied last week has completely stuck down to all of the open wound sites. Edema control is improved. Electronic Signature(s) Signed: 01/18/2023 1:32:04 PM By: Fredirick Maudlin MD FACS Entered By: Fredirick Maudlin on 01/18/2023 13:32:04 -------------------------------------------------------------------------------- Physician Orders Details Patient Name: Date of Service: TO Earl [name], NA Earl [name] 01/18/2023 12:45 PM Medical Record Number: [id number] Patient Account Number: [id number] Date of Birth/Sex: Treating RN: 10-05-1941 (82 y.o. [name] Primary Care Provider: Kirtland Bouchard Other Clinician: Referring Provider: Treating Provider/Extender: Collie Siad in Treatment: 1 Verbal / Phone Orders: No Diagnosis Coding ICD-10 Coding Code Description 9860447931 Non-pressure chronic ulcer of right calf limited to breakdown of skin L97.811 Non-pressure chronic ulcer of other part of right lower leg limited to breakdown of skin I87.2 Venous insufficiency (chronic) (peripheral) E11.622 Type 2 diabetes mellitus with other skin ulcer [id number] Chronic systolic (congestive) heart failure I69.30  Unspecified sequelae of cerebral infarction N18.2 Chronic kidney disease, stage 2 (mild) Follow-up Appointments ppointment in 1 week. - Dr. Celine Ahr Rm 4 Return A Bathing/ Shower/ Hygiene May shower with protection but do not get wound dressing(s) wet. Protect dressing(s) with water repellant cover (for example, large plastic bag) or a cast cover and may then take shower. - May purchase a Cast protector from [facility] or [facility]. Please keep dressing clean and dry at all times Edema Control - Lymphedema / SCD / Other Right Lower Extremity Segmental Compressive Device. Use the Segmental Compressive Device on leg(s) 2-3 times a day for 45 - 60 minutes. If wearing any wraps or hose, do not remove them. Continue exercising as instructed. Earl [name], Earl [name] ([id number]) [id number] Page 4 of 10 Elevate legs to the level of the heart or above for 30 minutes daily and/or when sitting for 3-4 times a day throughout the day. Patient to wear  own compression stockings every day. - On left leg Compression stocking or Garment 20-30 mm/Hg pressure to: - on Left leg Wound Treatment Wound #1 - Lower Leg Wound Laterality: Right, Medial Cleanser: Soap and Water 1 x Per Week Discharge Instructions: May shower and wash wound with dial antibacterial soap and water prior to dressing change. Cleanser: Wound Cleanser 1 x Per Week Discharge Instructions: Cleanse the wound with wound cleanser prior to applying a clean dressing using gauze sponges, not tissue or cotton balls. Peri-Wound Care: Zinc Oxide Ointment 30g tube 1 x Per Week Discharge Instructions: Apply Zinc Oxide to periwound with each dressing change Prim Dressing: Maxorb Extra Ag+ Alginate Dressing, 4x4.75 (in/in) 1 x Per Week ary Discharge Instructions: Apply to wound bed as instructed Secondary Dressing: ABD Pad, 8x10 1 x Per Week Discharge Instructions: Apply over primary dressing as directed. Secured With: Transpore Surgical  Tape, 2x10 (in/yd) 1 x Per Week Discharge Instructions: Secure dressing with tape as directed. Compression Wrap: Urgo K2 Lite, two layer compression system, regular 1 x Per Week Discharge Instructions: Apply Urgo K2 Lite as directed (alternative to 3 layer compression). Wound #2 - Lower Leg Wound Laterality: Right, Anterior Cleanser: Soap and Water 1 x Per Week Discharge Instructions: May shower and wash wound with dial antibacterial soap and water prior to dressing change. Cleanser: Wound Cleanser 1 x Per Week Discharge Instructions: Cleanse the wound with wound cleanser prior to applying a clean dressing using gauze sponges, not tissue or cotton balls. Peri-Wound Care: Zinc Oxide Ointment 30g tube 1 x Per Week Discharge Instructions: Apply Zinc Oxide to periwound with each dressing change Prim Dressing: Maxorb Extra Ag+ Alginate Dressing, 4x4.75 (in/in) 1 x Per Week ary Discharge Instructions: Apply to wound bed as instructed Secondary Dressing: ABD Pad, 8x10 1 x Per Week Discharge Instructions: Apply over primary dressing as directed. Secured With: Transpore Surgical Tape, 2x10 (in/yd) 1 x Per Week Discharge Instructions: Secure dressing with tape as directed. Compression Wrap: Urgo K2 Lite, two layer compression system, regular 1 x Per Week Discharge Instructions: Apply Urgo K2 Lite as directed (alternative to 3 layer compression). Wound #3 - Lower Leg Wound Laterality: Right, Posterior Cleanser: Soap and Water 1 x Per Week Discharge Instructions: May shower and wash wound with dial antibacterial soap and water prior to dressing change. Cleanser: Wound Cleanser 1 x Per Week Discharge Instructions: Cleanse the wound with wound cleanser prior to applying a clean dressing using gauze sponges, not tissue or cotton balls. Peri-Wound Care: Zinc Oxide Ointment 30g tube 1 x Per Week Discharge Instructions: Apply Zinc Oxide to periwound with each dressing change Prim Dressing: Maxorb Extra Ag+  Alginate Dressing, 4x4.75 (in/in) 1 x Per Week ary Discharge Instructions: Apply to wound bed as instructed Secondary Dressing: ABD Pad, 8x10 1 x Per Week Discharge Instructions: Apply over primary dressing as directed. Secured With: Transpore Surgical Tape, 2x10 (in/yd) 1 x Per Week Discharge Instructions: Secure dressing with tape as directed. Compression Wrap: Urgo K2 Lite, two layer compression system, regular 1 x Per Week Discharge Instructions: Apply Urgo K2 Lite as directed (alternative to 3 layer compression). Electronic Signature(s) Signed: 01/18/2023 4:13:19 PM By: Fredirick Maudlin MD FACS Entered By: Fredirick Maudlin on 01/18/2023 13:34:56 Earl [name] ([id number] Page 5 of 10 -------------------------------------------------------------------------------- Problem List Details Patient Name: Date of Service: TO Earl [name], [place] Earl [name] 01/18/2023 12:45 PM Medical Record Number: [id number] Patient Account Number: [id number] Date of Birth/Sex: Treating RN: 14-Jan-1941 (82 y.o. M) Primary Care Provider: Melford Aase,  Lendon Ka Other Clinician: Referring Provider: Treating Provider/Extender: Collie Siad in Treatment: 1 Active Problems ICD-10 Encounter Code Description Active Date MDM Diagnosis L97.211 Non-pressure chronic ulcer of right calf limited to breakdown of skin 01/09/2023 No Yes L97.811 Non-pressure chronic ulcer of other part of right lower leg limited to breakdown 01/09/2023 No Yes of skin I87.2 Venous insufficiency (chronic) (peripheral) 01/09/2023 No Yes E11.622 Type 2 diabetes mellitus with other skin ulcer 01/09/2023 No Yes [id number] Chronic systolic (congestive) heart failure 01/09/2023 No Yes I69.30 Unspecified sequelae of cerebral infarction 01/09/2023 No Yes N18.2 Chronic kidney disease, stage 2 (mild) 01/09/2023 No Yes Inactive Problems Resolved Problems Electronic Signature(s) Signed: 01/18/2023  1:30:33 PM By: Fredirick Maudlin MD FACS Entered By: Fredirick Maudlin on 01/18/2023 13:30:32 -------------------------------------------------------------------------------- Progress Note Details Patient Name: Date of Service: TO Earl [name], NA Earl [name] 01/18/2023 12:45 PM Medical Record Number: [id number] Patient Account Number: [id number] Date of Birth/Sex: Treating RN: [birth year] (82 y.o. 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 Near the beginning of the year, he had increased swelling in his legs and developed an ulcer on his right calf. Apparently this has been treated with Betadine sugar dressings per his PCPs office. His right leg has multiple superficial open ulcers that all look like they started with blisters. There is slough accumulation in the posterior calf ulcer. He has a palpable pedal pulse. The skin of both legs is tight and shiny. 01/18/2023: The silver alginate that we applied last week has completely stuck down to all of the open wound sites. Edema control is improved. Patient History Social History Never smoker, Marital Status - Widowed, Alcohol Use - Never, Drug Use - No History, Caffeine Use - Never. Medical History Eyes Denies history of Cataracts, Glaucoma, Optic Neuritis Ear/Nose/Mouth/Throat Denies history of Chronic sinus problems/congestion, Middle ear problems Cardiovascular Patient has history of Arrhythmia,  Congestive Heart Failure, Hypertension Endocrine Patient has history of Type II Diabetes Genitourinary Denies history of End Stage Renal Disease Neurologic Denies history of Neuropathy Oncologic Denies history of Received Chemotherapy, Received Radiation Psychiatric Denies history of Anorexia/bulimia, Confinement Anxiety Objective Constitutional no acute distress. Vitals Time Taken: 12:55 PM, Height: 71 in, Weight: 151 lbs, BMI: 21.1, Temperature: 98.3 F, Pulse: 69 bpm, Respiratory Rate: 18 breaths/min, Blood Pressure: 105/65 mmHg. Respiratory Normal work of breathing on room air. General Notes: 01/18/2023: The silver alginate that we applied last week has completely stuck down to all of the open wound sites. Edema control is improved. Integumentary (Hair, Skin) Wound #1 status is Open. Original cause of wound was Blister. The date acquired was: 11/23/2022. The wound has been in treatment 1 [name]. The wound is located on the Right,Medial Lower Leg. The wound measures 0.3cm length x 0.3cm width x 0.1cm depth; 0.071cm^2 area and 0.007cm^3 volume. There is Fat Layer (Subcutaneous Tissue) exposed. There is no tunneling or undermining noted. There is a medium amount of serous drainage noted. There is medium (34- 66%) pink granulation within the wound bed. There is a medium (34-66%) amount of necrotic tissue within the wound bed including  Adherent Slough. The periwound skin appearance had no abnormalities noted for texture. The periwound skin appearance had no abnormalities noted for color. The periwound skin appearance exhibited: Maceration. The periwound skin appearance did not exhibit: Dry/Scaly. Wound #2 status is Open. Original cause of wound was Blister. The date acquired was: 11/23/2022. The wound has been in treatment 1 [name]. The wound is located on the Right,Anterior Lower Leg. The wound measures 6cm length x 1.5cm width x 0.1cm depth; 7.069cm^2 area and 0.707cm^3 volume. There is  Fat Layer (Subcutaneous Tissue) exposed. There is no tunneling or undermining noted. There is a medium amount of serous drainage noted. There is large (67- 100%) pink granulation within the wound bed. There is a small (1-33%) amount of necrotic tissue within the wound bed including Eschar. The periwound skin appearance had no abnormalities noted for texture. The periwound skin appearance exhibited: Maceration, Rubor. The periwound skin appearance did not exhibit: Dry/Scaly. Periwound temperature was noted as No Abnormality. The periwound has tenderness on palpation. Wound #3 status is Open. Original cause of wound was Blister. The date acquired was: 11/23/2022. The wound has been in treatment 1 [name]. The wound is located on the Right,Posterior Lower Leg. The wound measures 6cm length x 2cm width x 0.1cm depth; 9.425cm^2 area and 0.942cm^3 volume. There is Fat Layer (Subcutaneous Tissue) exposed. There is no tunneling or undermining noted. There is a medium amount of serous drainage noted. There is large (67- 100%) pink granulation within the wound bed. There is a small (1-33%) amount of necrotic tissue within the wound bed including Eschar. The periwound skin appearance had no abnormalities noted for texture. The periwound skin appearance exhibited: Dry/Scaly, Rubor. Periwound temperature was noted as No Abnormality. The periwound has tenderness on palpation. Assessment Active Problems ICD-10 Earl [name], Earl [name] ([id number]) [id number] Page 7 of 10 Non-pressure chronic ulcer of right calf limited to breakdown of skin Non-pressure chronic ulcer of other part of right lower leg limited to breakdown of skin Venous insufficiency (chronic) (peripheral) Type 2 diabetes mellitus with other skin ulcer Chronic systolic (congestive) heart failure Unspecified sequelae of cerebral infarction Chronic kidney disease, stage 2 (mild) Procedures Wound #2 Pre-procedure diagnosis of  Wound #2 is a Diabetic Wound/Ulcer of the Lower Extremity located on the Right,Anterior Lower Leg .Severity of Tissue Pre Debridement is: Fat layer exposed. There was a Selective/Open Wound Non-Viable Tissue Debridement with a total area of 9 sq cm performed by Fredirick Maudlin, MD. With the following instrument(s): Curette to remove Non-Viable tissue/material. Material removed includes Eschar and Slough and after achieving pain control using Lidocaine 5% topical ointment. No specimens were taken. A time out was conducted at 13:12, prior to the start of the procedure. A Minimum amount of bleeding was controlled with Pressure. The procedure was tolerated well with a pain level of 0 throughout and a pain level of 0 following the procedure. Post Debridement Measurements: 6cm length x 1.5cm width x 0.1cm depth; 0.707cm^3 volume. Character of Wound/Ulcer Post Debridement requires further debridement. Severity of Tissue Post Debridement is: Fat layer exposed. Post procedure Diagnosis Wound #2: Same as Pre-Procedure General Notes: Scribed for Dr. Celine Ahr by Blanche East, RN. Pre-procedure diagnosis of Wound #2 is a Diabetic Wound/Ulcer of the Lower Extremity located on the Right,Anterior Lower Leg . There was a Three Layer Compression Therapy Procedure by Blanche East, RN. Post procedure Diagnosis Wound #2: Same as Pre-Procedure Wound #3 Pre-procedure diagnosis of Wound #3 is a Diabetic Wound/Ulcer of the Lower Extremity located on the Right,Posterior  Lower Leg .Severity of Tissue Pre Debridement is: Fat layer exposed. There was a Selective/Open Wound Non-Viable Tissue Debridement with a total area of 5 sq cm performed by Fredirick Maudlin, MD. With the following instrument(s): Curette to remove Non-Viable tissue/material. Material removed includes Eschar and Slough and after achieving pain control using Lidocaine 5% topical ointment. No specimens were taken. A time out was conducted at 13:12, prior to the  start of the procedure. A Minimum amount of bleeding was controlled with Pressure. The procedure was tolerated well with a pain level of 0 throughout and a pain level of 0 following the procedure. Post Debridement Measurements: 6cm length x 2cm width x 0.1cm depth; 0.942cm^3 volume. Character of Wound/Ulcer Post Debridement requires further debridement. Severity of Tissue Post Debridement is: Fat layer exposed. Post procedure Diagnosis Wound #3: Same as Pre-Procedure General Notes: Scribed for Dr. Celine Ahr by Blanche East, RN. Pre-procedure diagnosis of Wound #3 is a Diabetic Wound/Ulcer of the Lower Extremity located on the Right,Posterior Lower Leg . There was a Three Layer Compression Therapy Procedure by Blanche East, RN. Post procedure Diagnosis Wound #3: Same as Pre-Procedure Plan Follow-up Appointments: Return Appointment in 1 week. - Dr. Celine Ahr Rm 4 Bathing/ Shower/ Hygiene: May shower with protection but do not get wound dressing(s) wet. Protect dressing(s) with water repellant cover (for example, large plastic bag) or a cast cover and may then take shower. - May purchase a Cast protector from [facility] or [facility]. Please keep dressing clean and dry at all times Edema Control - Lymphedema / SCD / Other: Segmental Compressive Device. Use the Segmental Compressive Device on leg(s) 2-3 times a day for 45 - 60 minutes. If wearing any wraps or hose, do not remove them. Continue exercising as instructed. Elevate legs to the level of the heart or above for 30 minutes daily and/or when sitting for 3-4 times a day throughout the day. Patient to wear own compression stockings every day. - On left leg Compression stocking or Garment 20-30 mm/Hg pressure to: - on Left leg WOUND #1: - Lower Leg Wound Laterality: Right, Medial Cleanser: Soap and Water 1 x Per Week/ Discharge Instructions: May shower and wash wound with dial antibacterial soap and water prior to dressing change. Cleanser: Wound Cleanser  1 x Per Week/ Discharge Instructions: Cleanse the wound with wound cleanser prior to applying a clean dressing using gauze sponges, not tissue or cotton balls. Peri-Wound Care: Zinc Oxide Ointment 30g tube 1 x Per Week/ Discharge Instructions: Apply Zinc Oxide to periwound with each dressing change Prim Dressing: Maxorb Extra Ag+ Alginate Dressing, 4x4.75 (in/in) 1 x Per Week/ ary Discharge Instructions: Apply to wound bed as instructed Secondary Dressing: ABD Pad, 8x10 1 x Per Week/ Discharge Instructions: Apply over primary dressing as directed. Secured With: Transpore Surgical T ape, 2x10 (in/yd) 1 x Per Week/ Discharge Instructions: Secure dressing with tape as directed. Com pression Wrap: Urgo K2 Lite, two layer compression system, regular 1 x Per Week/ Discharge Instructions: Apply Urgo K2 Lite as directed (alternative to 3 layer compression). WOUND #2: - Lower Leg Wound Laterality: Right, Anterior Cleanser: Soap and Water 1 x Per Week/ Discharge Instructions: May shower and wash wound with dial antibacterial soap and water prior to dressing change. Cleanser: Wound Cleanser 1 x Per Week/ Discharge Instructions: Cleanse the wound with wound cleanser prior to applying a clean dressing using gauze sponges, not tissue or cotton balls. Peri-Wound Care: Zinc Oxide Ointment 30g tube 1 x Per Week/ Discharge Instructions: Apply  Zinc Oxide to periwound with each dressing change Prim Dressing: Maxorb Extra Ag+ Alginate Dressing, 4x4.75 (in/in) 1 x Per Week/ ary Discharge Instructions: Apply to wound bed as instructed Secondary Dressing: ABD Pad, 8x10 1 x Per Week/ Discharge Instructions: Apply over primary dressing as directed. Earl [name], Earl [name] ([id number]) [id number] Page 8 of 10 Secured With: Transpore Surgical T ape, 2x10 (in/yd) 1 x Per Week/ Discharge Instructions: Secure dressing with tape as directed. Com pression Wrap: Urgo K2 Lite, two layer compression  system, regular 1 x Per Week/ Discharge Instructions: Apply Urgo K2 Lite as directed (alternative to 3 layer compression). WOUND #3: - Lower Leg Wound Laterality: Right, Posterior Cleanser: Soap and Water 1 x Per Week/ Discharge Instructions: May shower and wash wound with dial antibacterial soap and water prior to dressing change. Cleanser: Wound Cleanser 1 x Per Week/ Discharge Instructions: Cleanse the wound with wound cleanser prior to applying a clean dressing using gauze sponges, not tissue or cotton balls. Peri-Wound Care: Zinc Oxide Ointment 30g tube 1 x Per Week/ Discharge Instructions: Apply Zinc Oxide to periwound with each dressing change Prim Dressing: Maxorb Extra Ag+ Alginate Dressing, 4x4.75 (in/in) 1 x Per Week/ ary Discharge Instructions: Apply to wound bed as instructed Secondary Dressing: ABD Pad, 8x10 1 x Per Week/ Discharge Instructions: Apply over primary dressing as directed. Secured With: Transpore Surgical T ape, 2x10 (in/yd) 1 x Per Week/ Discharge Instructions: Secure dressing with tape as directed. Com pression Wrap: Urgo K2 Lite, two layer compression system, regular 1 x Per Week/ Discharge Instructions: Apply Urgo K2 Lite as directed (alternative to 3 layer compression). 01/18/2023: The silver alginate that we applied last week has completely stuck down to all of the open wound sites. Edema control is improved. I used a curette to debride the stuck down silver alginate and eschar away from the wound sites. There has been considerable healing and epithelialization since his visit last week. We are going to use a different brand of silver alginate; hopefully we will not be faced with the same issue. Continue 3 layer compression. He was reminded that he needs to wear his compression stocking on his left leg. Follow-up in 1 week. Electronic Signature(s) Signed: 01/18/2023 1:35:48 PM By: Fredirick Maudlin MD FACS Entered By: Fredirick Maudlin on 01/18/2023  13:35:48 -------------------------------------------------------------------------------- HxROS Details Patient Name: Date of Service: TO Earl [name], NA Earl [name] 01/18/2023 12:45 PM Medical Record Number: [id number] Patient Account Number: [id number] Date of Birth/Sex: Treating RN: 1940-11-27 (82 y.o. M) Primary Care Provider: Kirtland Bouchard Other Clinician: Referring Provider: Treating Provider/Extender: Collie Siad in Treatment: 1 Eyes Medical History: Negative for: Cataracts; Glaucoma; Optic Neuritis Ear/Nose/Mouth/Throat Medical History: Negative for: Chronic sinus problems/congestion; Middle ear problems Cardiovascular Medical History: Positive for: Arrhythmia; Congestive Heart Failure; Hypertension Endocrine Medical History: Positive for: Type II Diabetes Treated with: Oral agents Blood sugar tested every day: Yes Tested : Genitourinary Medical History: Negative for: End Stage Renal Disease Neurologic Medical History: Negative for: Neuropathy Earl [name], Earl [name] ([id number]) [id number] Page 9 of 10 Oncologic Medical History: Negative for: Received Chemotherapy; Received Radiation Psychiatric Medical History: Negative for: Anorexia/bulimia; Confinement Anxiety Immunizations Pneumococcal Vaccine: Received Pneumococcal Vaccination: Yes Received Pneumococcal Vaccination On or After 60th Birthday: Yes Implantable Devices None Family and Social History Never smoker; Marital Status - Widowed; Alcohol Use: Never; Drug Use: No History; Caffeine Use: Never; Financial Concerns: No; Food, Clothing or Shelter Needs: No; Support System Lacking: No; Transportation Concerns: No Electronic Signature(s) Signed: 01/18/2023 4:13:19 PM By:  Fredirick Maudlin MD FACS Entered By: Fredirick Maudlin on 01/18/2023 13:31:43 -------------------------------------------------------------------------------- SuperBill Details Patient  Name: Date of Service: TO Earl [name], [place] Earl [name] 01/18/2023 Medical Record Number: [id number] Patient Account Number: [id number] Date of Birth/Sex: Treating RN: 1941/01/06 (82 y.o. 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, stage 2 (mild) Facility Procedures : CPT4 Code: [id number] Description: [id number] - DEBRIDE WOUND 1ST 20 SQ CM OR < ICD-10 Diagnosis Description L97.211 Non-pressure chronic ulcer of right calf limited to breakdown of skin L97.811 Non-pressure chronic ulcer of other part of right lower leg limited to breakdown Modifier: of skin Quantity: 1 Physician Procedures : CPT4 Code Description Modifier [id number] - WC PHYS LEVEL 3 - EST PT 25 ICD-10 Diagnosis Description L97.211 Non-pressure chronic ulcer of right calf limited to breakdown of skin L97.811 Non-pressure chronic ulcer of other part of right lower leg  limited to breakdown of skin I87.2 Venous insufficiency (chronic) (peripheral) E11.622 Type 2 diabetes mellitus with other skin ulcer Quantity: 1 : [id number] - WC PHYS DEBR WO ANESTH 20 SQ CM ICD-10 Diagnosis Description Earl [name], Earl [name] ([id number]) [id number] L97.211 Non-pressure chronic ulcer of right calf limited to breakdown of skin L97.811 Non-pressure chronic  ulcer of other part of right lower leg limited to breakdown of skin Quantity: 1 27.pdf Page 10 of  10 Electronic Signature(s) Signed: 01/18/2023 1:38:21 PM By: Fredirick Maudlin MD FACS Entered By: Fredirick Maudlin on 01/18/2023 13:38:20

## 2023-01-19 NOTE — Progress Notes (Signed)
Earl [name], Earl [name] ([id number]) [id number] Page 1 of 10 Visit Report for 01/18/2023 Arrival Information Details Patient Name: Date of Service: Earl [name], [place] THA [name] 01/18/2023 12:45 PM Medical Record Number: [id number] Patient Account Number: [id number] Date of Birth/Sex: Treating RN: Earl [name] (82 y.o. Earl [name] Primary Care Earl [name]: Earl [name] Other Clinician: Referring Earl [name]: Treating Earl [name]/Extender: Earl [name] in Treatment: 1 Visit Information History Since Last Visit Added or deleted any medications: No Patient Arrived: Ambulatory Any new allergies or adverse reactions: No Arrival Time: 12:50 Had a fall or experienced change in No Transfer Assistance: Manual activities of daily living that may affect Patient Identification Verified: Yes risk of falls: Secondary Verification Process Completed: Yes Signs or symptoms of abuse/neglect since last visito No Patient Requires Transmission-Based Precautions: No Hospitalized since last visit: No Patient Has Alerts: Yes Implantable device outside of the clinic excluding No Patient Alerts: Patient on Blood Thinner cellular tissue based products placed in the center since last visit: Has Dressing in Place as Prescribed: Yes Pain Present Now: Yes Electronic Signature(s) Signed: 01/18/2023 4:01:42 PM By: Earl East RN Entered By: Earl [name] on 01/18/2023 12:52:14 -------------------------------------------------------------------------------- Compression Therapy Details Patient Name: Date of Service: Earl [name], Earl [name] 01/18/2023 12:45 PM Medical Record Number: [id number] Patient Account Number: [id number] Date of Birth/Sex: Treating RN: Earl [name] (82 y.o. Earl [name] Primary Care Masato Pettie: Earl [name] Other Clinician: Referring Earl [name]: Treating Earl [name]/Extender: Earl [name] in Treatment:  1 Compression Therapy Performed for Wound Assessment: Wound #3 Right,Posterior Lower Leg Performed By: Clinician Earl East, RN Compression Type: Three Layer Post Procedure Diagnosis Same as Pre-procedure Electronic Signature(s) Signed: 01/18/2023 4:01:42 PM By: Earl East RN Entered By: Earl [name] on 01/18/2023 13:26:32 -------------------------------------------------------------------------------- Compression Therapy Details Patient Name: Date of Service: Earl [name], Earl [name] 01/18/2023 12:45 PM Medical Record Number: [id number] Patient Account Number: [id number] Date of Birth/Sex: Treating RN: Earl [name] (82 y.o. Earl [name] Primary Care Cailyn Houdek: Earl [name] Other Clinician: Referring Lailyn Appelbaum: Treating Earl [name]/Extender: Earl [name] in Treatment: 1 Compression Therapy Performed for Wound Assessment: Wound #2 Right,Anterior Lower Leg Performed By: Clinician Earl East, RN Compression Type: Three Layer Post Procedure Diagnosis Same as Pre-procedure Earl [name] ([id number]) [id number] Page 2 of 10 Electronic Signature(s) Signed: 01/18/2023 4:01:42 PM By: Earl East RN Entered By: Earl [name] on 01/18/2023 13:26:32 -------------------------------------------------------------------------------- Encounter Discharge Information Details Patient Name: Date of Service: Earl [name], Earl [name] 01/18/2023 12:45 PM Medical Record Number: [id number] Patient Account Number: [id number] Date of Birth/Sex: Treating RN: Earl [name] (82 y.o. Earl [name] Primary Care Kasem Mozer: Earl [name] Other Clinician: Referring Nayvie Lips: Treating Pranika Finks/Extender: Earl [name] in Treatment: 1 Encounter Discharge Information Items Post Procedure Vitals Discharge Condition: Stable Temperature (F): 98.3 Ambulatory Status: Walker Pulse (bpm): 69 Discharge Destination:  Home Respiratory Rate (breaths/min): 16 Transportation: Private Auto Blood Pressure (mmHg): 105/65 Accompanied By: daughter Schedule Follow-up Appointment: Yes Clinical Summary of Care: Electronic Signature(s) Signed: 01/18/2023 4:01:42 PM By: Earl East RN Entered By: Earl [name] on 01/18/2023 13:18:30 -------------------------------------------------------------------------------- Lower Extremity Assessment Details Patient Name: Date of Service: Earl Earl [name] THA [name] 01/18/2023 12:45 PM Medical Record Number: [id number] Patient Account Number: [id number] Date of Birth/Sex: Treating RN: [name] (82 y.o. Earl [name] Primary Care Danyelle Brookover: Earl [name] Other Clinician: Referring Earl [name]: Treating Earl [name]/Extender: Earl [name] in Treatment: 1 Edema Assessment Assessed: [Left: No] [Right: No] [  Left: Edema] [Right: :] Calf Left: Right: Point of Measurement: From Medial Instep 37 cm Ankle Left: Right: Point of Measurement: From Medial Instep 23.5 cm Vascular Assessment Pulses: Dorsalis Pedis Palpable: [Right:Yes] Electronic Signature(s) Signed: 01/18/2023 4:01:42 PM By: Earl East RN Entered By: Earl [name] on 01/18/2023 13:01:48 -------------------------------------------------------------------------------- Multi Wound Chart Details Patient Name: Date of Service: Earl [name], Earl [name] 01/18/2023 12:45 PM Earl [name] ([id number]) [id number] Page 3 of 10 Medical Record Number: [id number] Patient Account Number: [id number] Date of Birth/Sex: Treating RN: [birth year][name] (82 y.o. M) Primary Care Leilynn Pilat: Earl [name] Other Clinician: Referring Jenalyn Girdner: Treating Earl [name]/Extender: Earl [name] in Treatment: 1 Vital Signs Height(in): 71 Pulse(bpm): 69 Weight(lbs): 151 Blood Pressure(mmHg): 105/65 Body Mass Index(BMI): 21.1 Temperature(F):  98.3 Respiratory Rate(breaths/min): 18 [1:Photos: No Photos Right, Medial Lower Leg Wound Location: Blister Wounding Event: Diabetic Wound/Ulcer of the Lower Primary Etiology: Extremity Arrhythmia, Congestive Heart Failure, Arrhythmia, Congestive Heart Failure, Arrhythmia, Congestive Heart Failure,  Comorbid History: Hypertension, Type II Diabetes 11/23/2022 Date Acquired: 1 [name] of Treatment: Open Wound Status: No Wound Recurrence: 0.3x0.3x0.1 Measurements L x W x D (cm) 0.071 A (cm) : rea 0.007 Volume (cm) : 0.00% % Reduction in A rea: 0.00% %  Reduction in Volume: Grade 1 Classification: Medium Exudate A mount: Serous Exudate Type: amber Exudate Color: Medium (34-66%) Granulation A mount: Pink Granulation Quality: Medium (34-66%) Necrotic A mount: Adherent Slough Necrotic Tissue: Fat Layer  (Subcutaneous Tissue): Yes Fat Layer (Subcutaneous Tissue): Yes Fat Layer (Subcutaneous Tissue): Yes Exposed Structures: Fascia: No Tendon: No Muscle: No Joint: No Small (1-33%) Epithelialization: N/A Debridement: Pre-procedure Verification/Time Out N/A  Taken: N/A Pain Control: N/A Tissue Debrided: N/A Level: N/A Debridement A (sq cm): rea N/A Instrument: N/A Bleeding: N/A Hemostasis A chieved: N/A Procedural Pain: N/A Post Procedural Pain: N/A Debridement Treatment Response: N/A Post Debridement  Measurements L x W x D (cm) N/A Post Debridement Volume: (cm) No Abnormalities Noted Periwound Skin Texture: Maceration: Yes Periwound Skin Moisture: Dry/Scaly: No No Abnormalities Noted Periwound Skin Color: N/A Temperature: N/A Tenderness on  Palpation: N/A Procedures Performed:] [2:No Photos Right, Anterior Lower Leg Blister 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, Type II Diabetes 11/23/2022 1 Open No 6x1.5x0.1 7.069 0.707 91.20% 91.20% Grade 1 Medium Serous amber Large  (67-100%) Pink Small (1-33%) Eschar Fascia: No Tendon: No Muscle: No Joint: No Bone: No Small (1-33%) Debridement - Selective/Open  Wound Debridement - Selective/Open Wound 13:12 Lidocaine 5% topical ointment Necrotic/Eschar, Slough Non-Viable Tissue 9  Curette Minimum Pressure 0 0 Procedure was tolerated well 6x1.5x0.1 0.707 No Abnormalities Noted Maceration: Yes Dry/Scaly: No Rubor: Yes No Abnormality Yes Compression Therapy Debridement] [3:No Photos Right, Posterior Lower Leg Blister Diabetic  Wound/Ulcer of the Lower Extremity Hypertension, Type II Diabetes 11/23/2022 1 Open No 6x2x0.1 9.425 0.942 64.30% 64.30% Grade 1 Medium Serous amber Large (67-100%) Pink Small (1-33%) Eschar Fascia: No Tendon: No Muscle: No Joint: No Bone: No N/A 13:12  Lidocaine 5% topical ointment Necrotic/Eschar, Slough Non-Viable Tissue 5 Curette Minimum Pressure 0 0 Procedure was tolerated well 6x2x0.1 0.942 No Abnormalities Noted Dry/Scaly: Yes Rubor: Yes No Abnormality Yes Compression Therapy Debridement] Treatment Notes Wound #1 (Lower Leg) Wound Laterality: Right, Medial Cleanser Soap and Water Discharge Instruction: May shower and wash wound with dial antibacterial soap and water prior Earl dressing change. Wound Cleanser Discharge Instruction: Cleanse the wound with wound cleanser prior Earl applying a clean dressing using gauze sponges, not tissue or cotton balls. Earl [name], Earl [name] ([id number])  [phone].pdf Page 4 of 10 Peri-Wound Care Zinc Oxide Ointment 30g tube Discharge Instruction: Apply Zinc Oxide Earl periwound with each dressing change Topical Primary Dressing Maxorb Extra Ag+ Alginate Dressing, 4x4.75 (in/in) Discharge Instruction: Apply Earl wound bed as instructed Secondary Dressing ABD Pad, 8x10 Discharge Instruction: Apply over primary dressing as directed. Secured With Transpore Surgical Tape, 2x10 (in/yd) Discharge Instruction: Secure dressing with tape as directed. Compression Wrap Urgo K2 Lite, two layer compression system, regular Discharge Instruction: Apply Urgo K2 Lite as directed (alternative  Earl 3 layer compression). Compression Stockings Add-Ons Wound #2 (Lower Leg) Wound Laterality: Right, Anterior Cleanser Soap and Water Discharge Instruction: May shower and wash wound with dial antibacterial soap and water prior Earl dressing change. Wound Cleanser Discharge Instruction: Cleanse the wound with wound cleanser prior Earl applying a clean dressing using gauze sponges, not tissue or cotton balls. Peri-Wound Care Zinc Oxide Ointment 30g tube Discharge Instruction: Apply Zinc Oxide Earl periwound with each dressing change Topical Primary Dressing Maxorb Extra Ag+ Alginate Dressing, 4x4.75 (in/in) Discharge Instruction: Apply Earl wound bed as instructed Secondary Dressing ABD Pad, 8x10 Discharge Instruction: Apply over primary dressing as directed. Secured With Transpore Surgical Tape, 2x10 (in/yd) Discharge Instruction: Secure dressing with tape as directed. Compression Wrap Urgo K2 Lite, two layer compression system, regular Discharge Instruction: Apply Urgo K2 Lite as directed (alternative Earl 3 layer compression). Compression Stockings Add-Ons Wound #3 (Lower Leg) Wound Laterality: Right, Posterior Cleanser Soap and Water Discharge Instruction: May shower and wash wound with dial antibacterial soap and water prior Earl dressing change. Wound Cleanser Discharge Instruction: Cleanse the wound with wound cleanser prior Earl applying a clean dressing using gauze sponges, not tissue or cotton balls. Peri-Wound Care Zinc Oxide Ointment 30g tube Discharge Instruction: Apply Zinc Oxide Earl periwound with each dressing change Topical Earl [name], Earl [name] ([id number]) [phone].pdf Page 5 of 10 Primary Dressing Maxorb Extra Ag+ Alginate Dressing, 4x4.75 (in/in) Discharge Instruction: Apply Earl wound bed as instructed Secondary Dressing ABD Pad, 8x10 Discharge Instruction: Apply over primary dressing as directed. Secured With Transpore Surgical Tape, 2x10  (in/yd) Discharge Instruction: Secure dressing with tape as directed. Compression Wrap Urgo K2 Lite, two layer compression system, regular Discharge Instruction: Apply Urgo K2 Lite as directed (alternative Earl 3 layer compression). Compression Stockings Add-Ons Electronic Signature(s) Signed: 01/18/2023 1:30:44 PM By: Fredirick Maudlin MD FACS Entered By: Fredirick Maudlin on 01/18/2023 13:30:44 -------------------------------------------------------------------------------- Multi-Disciplinary Care Plan Details Patient Name: Date of Service: Earl [name], Earl [name] 01/18/2023 12:45 PM Medical Record Number: [id number] Patient Account Number: [id number] Date of Birth/Sex: Treating RN: [birth year] [name] (82 y.o. Earl [name] Primary Care Nadine Ryle: Earl [name] Other Clinician: Referring Shikira Folino: Treating Rosenda Geffrard/Extender: Earl [name] in Treatment: 1 Active Inactive Nutrition Nursing Diagnoses: Potential for alteratiion in Nutrition/Potential for imbalanced nutrition Goals: Patient/caregiver verbalizes understanding of need Earl maintain therapeutic glucose control per primary care physician Date Initiated: 01/09/2023 Target Resolution Date: 02/16/2023 Goal Status: Active Interventions: Provide education on elevated blood sugars and impact on wound healing Provide education on nutrition Treatment Activities: Dietary management education, guidance and counseling : 01/09/2023 Giving encouragement Earl exercise : 01/09/2023 Special diet education : 01/09/2023 Notes: Orientation [facility] Nursing Diagnoses: Knowledge deficit related Earl [facility] program Goals: Patient/caregiver will verbalize understanding of the [facility] Date Initiated: 01/09/2023 Target Resolution Date: 01/25/2023 Goal Status: Active Interventions: Provide education on orientation Earl the wound center Notes: Earl [name], Earl [name] ([id number])  [id number] Page 6 of  10 Wound/Skin Impairment Nursing Diagnoses: Knowledge deficit related Earl ulceration/compromised skin integrity Goals: Ulcer/skin breakdown will have a volume reduction of 30% by week 4 Date Initiated: 01/09/2023 Target Resolution Date: 02/15/2023 Goal Status: Active Interventions: Assess ulceration(s) every visit Notes: Electronic Signature(s) Signed: 01/18/2023 4:01:42 PM By: Earl East RN Entered By: Earl [name] on 01/18/2023 13:15:31 -------------------------------------------------------------------------------- Patient/Caregiver Education Details Patient Name: Date of Service: Earl Earl [name] THA [name] 2/28/2024andnbsp12:45 PM Medical Record Number: [id number] Patient Account Number: [id number] Date of Birth/Gender: Treating RN: [birth year] (82 y.o. Earl [name] Primary Care Physician: Earl [name] Other Clinician: Referring Physician: Treating Physician/Extender: Earl [name] in Treatment: 1 Education Assessment Education Provided Earl: Patient Education Topics Provided Wound Debridement: Methods: Explain/Verbal Responses: Reinforcements needed, State content correctly Wound/Skin Impairment: Methods: Explain/Verbal Responses: Reinforcements needed, State content correctly Electronic Signature(s) Signed: 01/18/2023 4:01:42 PM By: Earl East RN Entered By: Earl [name] on 01/18/2023 13:15:46 -------------------------------------------------------------------------------- Wound Assessment Details Patient Name: Date of Service: Earl [name], Earl [name] 01/18/2023 12:45 PM Medical Record Number: [id number] Patient Account Number: [id number] Date of Birth/Sex: Treating RN: [birth year] (82 y.o. Earl [name] Primary Care Kailen Name: Earl [name] Other Clinician: Referring Girtie Wiersma: Treating Kazim Corrales/Extender: Earl [name] in Treatment: 1 Wound  Status Wound Number: 1 Primary Diabetic Wound/Ulcer of the Lower Extremity Etiology: Wound Location: Right, Medial Lower Leg Wound Status: Open Wounding Event: Blister Comorbid Arrhythmia, Congestive Heart Failure, Hypertension, Type II Date Acquired: 11/23/2022 History: Diabetes [name] Of Treatment: 1 Clustered Wound: No Wound Measurements [name], Earl ([id number]) Length: (cm) 0.3 Width: (cm) 0.3 Depth: (cm) 0.1 Area: (cm) 0.071 Volume: (cm) 0.007 [id number] Page 7 of 10 % Reduction in Area: 0% % Reduction in Volume: 0% Epithelialization: Small (1-33%) Tunneling: No Undermining: No Wound Description Classification: Grade 1 Exudate Amount: Medium Exudate Type: Serous Exudate Color: amber Foul Odor After Cleansing: No Slough/Fibrino Yes Wound Bed Granulation Amount: Medium (34-66%) Exposed Structure Granulation Quality: Pink Fascia Exposed: No Necrotic Amount: Medium (34-66%) Fat Layer (Subcutaneous Tissue) Exposed: Yes Necrotic Quality: Adherent Slough Tendon Exposed: No Muscle Exposed: No Joint Exposed: No Periwound Skin Texture Texture Color No Abnormalities Noted: Yes No Abnormalities Noted: Yes Moisture No Abnormalities Noted: No Dry / Scaly: No Maceration: Yes Treatment Notes Wound #1 (Lower Leg) Wound Laterality: Right, Medial Cleanser Soap and Water Discharge Instruction: May shower and wash wound with dial antibacterial soap and water prior Earl dressing change. Wound Cleanser Discharge Instruction: Cleanse the wound with wound cleanser prior Earl applying a clean dressing using gauze sponges, not tissue or cotton balls. Peri-Wound Care Zinc Oxide Ointment 30g tube Discharge Instruction: Apply Zinc Oxide Earl periwound with each dressing change Topical Primary Dressing Maxorb Extra Ag+ Alginate Dressing, 4x4.75 (in/in) Discharge Instruction: Apply Earl wound bed as instructed Secondary Dressing ABD Pad, 8x10 Discharge  Instruction: Apply over primary dressing as directed. Secured With Transpore Surgical Tape, 2x10 (in/yd) Discharge Instruction: Secure dressing with tape as directed. Compression Wrap Urgo K2 Lite, two layer compression system, regular Discharge Instruction: Apply Urgo K2 Lite as directed (alternative Earl 3 layer compression). Compression Stockings Add-Ons Electronic Signature(s) Signed: 01/18/2023 4:01:42 PM By: Earl East RN Entered By: Earl [name] on 01/18/2023 13:04:30 Wound Assessment Details -------------------------------------------------------------------------------- Earl [name] ([id number]) [id number] Page 8 of 10 Patient Name: Date of Service: Earl [name], [place] THA [name] 01/18/2023 12:45 PM Medical Record Number: [id number] Patient Account Number: [id number] Date of Birth/Sex: Treating RN: [birth year] [name] (82 y.o. Earl [name] Primary Care Cheick Suhr:  Earl [name] Other Clinician: Referring Merci Walthers: Treating Favio Moder/Extender: Earl [name] in Treatment: 1 Wound Status Wound Number: 2 Primary Diabetic Wound/Ulcer of the Lower Extremity Etiology: Wound Location: Right, Anterior Lower Leg Wound Status: Open Wounding Event: Blister Comorbid Arrhythmia, Congestive Heart Failure, Hypertension, Type II Date Acquired: 11/23/2022 History: Diabetes [name] Of Treatment: 1 Clustered Wound: No Wound Measurements Length: (cm) 6 % Reduction in Area: 91.2% Width: (cm) 1.5 % Reduction in Volume: 91.2% Depth: (cm) 0.1 Epithelialization: Small (1-33%) Area: (cm) 7.069 Tunneling: No Volume: (cm) 0.707 Undermining: No Wound Description Classification: Grade 1 Foul Odor After Cleansing: No Exudate Amount: Medium Slough/Fibrino Yes Exudate Type: Serous Exudate Color: amber Wound Bed Granulation Amount: Large (67-100%) Exposed Structure Granulation Quality: Pink Fascia Exposed: No Necrotic Amount: Small  (1-33%) Fat Layer (Subcutaneous Tissue) Exposed: Yes Necrotic Quality: Eschar Tendon Exposed: No Muscle Exposed: No Joint Exposed: No Bone Exposed: No Periwound Skin Texture Texture Color No Abnormalities Noted: Yes No Abnormalities Noted: No Rubor: Yes Moisture No Abnormalities Noted: No Temperature / Pain Dry / Scaly: No Temperature: No Abnormality Maceration: Yes Tenderness on Palpation: Yes Treatment Notes Wound #2 (Lower Leg) Wound Laterality: Right, Anterior Cleanser Soap and Water Discharge Instruction: May shower and wash wound with dial antibacterial soap and water prior Earl dressing change. Wound Cleanser Discharge Instruction: Cleanse the wound with wound cleanser prior Earl applying a clean dressing using gauze sponges, not tissue or cotton balls. Peri-Wound Care Zinc Oxide Ointment 30g tube Discharge Instruction: Apply Zinc Oxide Earl periwound with each dressing change Topical Primary Dressing Maxorb Extra Ag+ Alginate Dressing, 4x4.75 (in/in) Discharge Instruction: Apply Earl wound bed as instructed Secondary Dressing ABD Pad, 8x10 Discharge Instruction: Apply over primary dressing as directed. Secured With Transpore Surgical Tape, 2x10 (in/yd) Discharge Instruction: Secure dressing with tape as directed. Earl [name], Earl [name] ([id number]) [id number] Page 9 of 10 Compression Wrap Urgo K2 Lite, two layer compression system, regular Discharge Instruction: Apply Urgo K2 Lite as directed (alternative Earl 3 layer compression). Compression Stockings Add-Ons Electronic Signature(s) Signed: 01/18/2023 4:01:42 PM By: Earl East RN Entered By: Earl [name] on 01/18/2023 13:05:17 -------------------------------------------------------------------------------- Wound Assessment Details Patient Name: Date of Service: Earl [name], Earl [name] 01/18/2023 12:45 PM Medical Record Number: [id number] Patient Account Number: [id number] Date of Birth/Sex:  Treating RN: [name] (82 y.o. Earl [name] Primary Care Sajan Cheatwood: Earl [name] Other Clinician: Referring Jacaria Colburn: Treating Avinash Maltos/Extender: Earl [name] in Treatment: 1 Wound Status Wound Number: 3 Primary Diabetic Wound/Ulcer of the Lower Extremity Etiology: Wound Location: Right, Posterior Lower Leg Wound Status: Open Wounding Event: Blister Comorbid Arrhythmia, Congestive Heart Failure, Hypertension, Type II Date Acquired: 11/23/2022 History: Diabetes [name] Of Treatment: 1 Clustered Wound: No Wound Measurements Length: (cm) 6 Width: (cm) 2 Depth: (cm) 0.1 Area: (cm) 9.425 Volume: (cm) 0.942 % Reduction in Area: 64.3% % Reduction in Volume: 64.3% Tunneling: No Undermining: No Wound Description Classification: Grade 1 Exudate Amount: Medium Exudate Type: Serous Exudate Color: amber Foul Odor After Cleansing: No Slough/Fibrino Yes Wound Bed Granulation Amount: Large (67-100%) Exposed Structure Granulation Quality: Pink Fascia Exposed: No Necrotic Amount: Small (1-33%) Fat Layer (Subcutaneous Tissue) Exposed: Yes Necrotic Quality: Eschar Tendon Exposed: No Muscle Exposed: No Joint Exposed: No Bone Exposed: No Periwound Skin Texture Texture Color No Abnormalities Noted: Yes No Abnormalities Noted: No Rubor: Yes Moisture No Abnormalities Noted: No Temperature / Pain Dry / Scaly: Yes Temperature: No Abnormality Tenderness on Palpation: Yes Treatment Notes Wound #3 (Lower Leg) Wound  Laterality: Right, Posterior Cleanser Soap and Water Discharge Instruction: May shower and wash wound with dial antibacterial soap and water prior Earl dressing change. Wound Cleanser Discharge Instruction: Cleanse the wound with wound cleanser prior Earl applying a clean dressing using gauze sponges, not tissue or cotton balls. Earl [name], Earl [name] ([id number]) [id number] Page 10 of 10 Peri-Wound Care Zinc Oxide  Ointment 30g tube Discharge Instruction: Apply Zinc Oxide Earl periwound with each dressing change Topical Primary Dressing Maxorb Extra Ag+ Alginate Dressing, 4x4.75 (in/in) Discharge Instruction: Apply Earl wound bed as instructed Secondary Dressing ABD Pad, 8x10 Discharge Instruction: Apply over primary dressing as directed. Secured With Transpore Surgical Tape, 2x10 (in/yd) Discharge Instruction: Secure dressing with tape as directed. Compression Wrap Urgo K2 Lite, two layer compression system, regular Discharge Instruction: Apply Urgo K2 Lite as directed (alternative Earl 3 layer compression). Compression Stockings Add-Ons Electronic Signature(s) Signed: 01/18/2023 4:01:42 PM By: Earl East RN Entered By: Earl [name] on 01/18/2023 13:06:10 -------------------------------------------------------------------------------- Vitals Details Patient Name: Date of Service: Earl [name], Earl [name] 01/18/2023 12:45 PM Medical Record Number: [id number] Patient Account Number: [id number] Date of Birth/Sex: Treating RN: [name] (82 y.o. Earl [name] Primary Care Milas Schappell: Earl [name] Other Clinician: Referring Reegan Bouffard: Treating Siddiq Kaluzny/Extender: Earl [name] in Treatment: 1 Vital Signs Time Taken: 12:55 Temperature (F): 98.3 Height (in): 71 Pulse (bpm): 69 Weight (lbs): 151 Respiratory Rate (breaths/min): 18 Body Mass Index (BMI): 21.1 Blood Pressure (mmHg): 105/65 Reference Range: 80 - 120 mg / dl Electronic Signature(s) Signed: 01/18/2023 4:01:42 PM By: Earl East RN Entered By: Earl [name] on 01/18/2023 13:00:56

## 2023-01-20 ENCOUNTER — Other Ambulatory Visit (HOSPITAL_COMMUNITY): Payer: Self-pay

## 2023-01-23 ENCOUNTER — Other Ambulatory Visit (HOSPITAL_COMMUNITY): Payer: Self-pay

## 2023-01-24 ENCOUNTER — Other Ambulatory Visit: Payer: Self-pay

## 2023-01-24 ENCOUNTER — Encounter: Payer: Self-pay | Admitting: Nurse Practitioner

## 2023-01-24 NOTE — Progress Notes (Unsigned)
Future Appointments  Date Time Provider Department  01/25/2023 11:00 AM Fredirick Maudlin, MD [facility]  01/25/2023  2:30 PM [name], MD [facility]  02/01/2023  3:00 PM Ricard Dillon, MD [facility]  02/14/2023  2:00 PM Dorian Pod, [facility]  02/22/2023  3:30 PM Marzetta Board, DPM [facility]  03/15/2023                          6 mo ov  2:30 PM  [name], MD [facility]  04/25/2023  2:30 PM Alda Berthold, DO [facility]  09/20/2023                          cpe  3:00 PM [name], MD [facility]  03/14/2024  2:00 PM [name], MD [facility]    History of Present Illness:  This very nice 82 y.o. [name]  with HTN, ASCVD/Hx CVA, HLD, T2_NIDDM  and Vitamin D Deficiency  returns for f/u of a venous stasis ulcer of the Rt calf.     Current Outpatient Medications on File Prior to Visit  Medication Sig   AMVUTTRA 25 MG/0.5ML syringe Inject into the skin.   atorvastatin  40 MG tablet TAKE 1 TABLET BY MOUTH  DAILY FOR CHOLESTEROL   baclofen (LIORESAL) 10 MG tablet Start '5mg'$  (half tablet) at bedtime x 1 week, then increase to half tablet twice daily.   VITAMIN D  Take 1 tablet  daily.    clotrimazole-betamethasone  Apply to affected area 2 times daily   B-12  50 MCG TABS Take 50 mcg daily.   ELIQUIS 5 MG TABS tablet TAKE 1 TABLET  TWICE DAILY    ferrous sulfate 325 (65 FE) MG tablet Take 325 mgat bedtime.   finasteride  5 MG tablet TAKE 1 TABLET DAILY FOR PROSTATE   glipiZIDE  5 MG tablet Patient takes 0.5 tablet twice a day.   levothyroxine 50 MCG tablet Take  1 tablet  Daily    Magnesium 250 MG TABS Take  daily.    metFORMIN -XR) 500 MG  TAKE 2 TABLETS TWICE  DAILY WITH MEALS    metoprolol succinate-XL) 50 MG 2 TAKE 1 TABLET AT  BEDTIME   Multiple Vitamin  Take 1 capsule daily.   potassium chloride  20 MEQ tablet Take 1 tablet ( daily.   sacubitril-valsartan (ENTRESTO) 24-26 MG Take 1 tablet by mouth 2 (two) times daily.   spironolactone 25 MG tablet  Take 1 tablet (25 mg total) by mouth at bedtime.   Tafamidis (VYNDAMAX) 61 MG CAPS Take 1 capsule by mouth daily.   terbinafine (LAMISIL) 250 MG tablet Take 1 tablet (250 mg total) by mouth daily for 14 days.   torsemide 20 MG tablet Take 1 tablet (20 mg total) by mouth 2 (two) times daily.   triamcinolone cream  0.1 % Apply 1 Application topically as needed.   ASPERCREME 10 % cream Apply 1 application topically daily as needed for muscle pain.   VITAMIN A  Take by mouth. 3000 mg   vitamin C (ASCORBIC ACID) 500 MG tablet Take 500 mg by mouth daily.     Allergies  Allergen Reactions   Penicillins Other (See Comments)     Problem list He has Paroxysmal atrial fibrillation (HCC); MGUS (monoclonal gammopathy of unknown significance); Essential hypertension; Hyperlipidemia associated with type 2 diabetes mellitus ([place]); Type 2 diabetes mellitus with stage 2 chronic kidney disease,  without long-term current use of insulin ([place]); BPH (benign prostatic hyperplasia); Medication management; Vitamin D deficiency; Hypothyroidism; History of CVA with residual deficit; Ataxia, post-stroke; Chronic anticoagulation; CKD stage 2 due to type 2 diabetes mellitus ([place]); Late effect of cerebrovascular accident (CVA); Acquired thrombophilia ([place]); Abdominal aortic atherosclerosis ([place]) by Abd CTscvn  2016; Neurologic gait disorder; Dizziness; History of loop recorder; Diabetic 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 ([place]); Depressed mood; Pain due to onychomycosis of toenail of left foot; Iron deficiency anemia; Acute on chronic congestive heart failure (HCC); NICM (nonischemic cardiomyopathy) ([place]); and Chronic systolic heart failure ([place]) on their problem list.   Observations/Objective:  There were no vitals taken for this visit.  HEENT - WNL. Neck - supple.  Chest - Clear equal BS. Cor - Nl HS. RRR w/o sig MGR. PP 1(+). No edema. MS- FROM w/o deformities.  Gait Nl. Neuro -  Nl w/o focal  abnormalities. Skin - Superficial denuded area of  Rt Calf appears healing well w/o sigs of deep ulceration. Wound cleaned with H2O2 rinse & Betadine sugar  compound re-applied & covered with Saran wrap & secured with tape . Patient daughter advised to change dressing in 3-4 days and may not require further dressing changes as appears re-epithelialization is intact.   Assessment and Plan:  1. Venous stasis ulcer of right calf limited to breakdown of skin without varicose veins (HCC)   2. Type 2 diabetes mellitus with stage 2 chronic kidney disease, without long-term current use of insulin (HCC)    Follow Up Instructions:        I discussed the assessment and treatment plan with the patient. The patient was provided an opportunity to ask questions and all were answered. The patient agreed with the plan and demonstrated an understanding of the instructions.       The patient was advised to call back or seek an in-person evaluation if the symptoms worsen or if the condition fails to improve as anticipated.    Kirtland Bouchard, MD

## 2023-01-25 ENCOUNTER — Encounter: Payer: Self-pay | Admitting: Internal Medicine

## 2023-01-25 ENCOUNTER — Ambulatory Visit (INDEPENDENT_AMBULATORY_CARE_PROVIDER_SITE_OTHER): Payer: Medicare Other | Admitting: Internal Medicine

## 2023-01-25 ENCOUNTER — Encounter (HOSPITAL_BASED_OUTPATIENT_CLINIC_OR_DEPARTMENT_OTHER): Payer: Medicare Other | Attending: General Surgery | Admitting: General Surgery

## 2023-01-25 VITALS — BP 112/60 | HR 63 | Temp 97.9°F | Resp 17 | Ht 71.0 in | Wt 162.8 lb

## 2023-01-25 DIAGNOSIS — I130 Hypertensive heart and chronic kidney disease with heart failure and stage 1 through stage 4 chronic kidney disease, or unspecified chronic kidney disease: Secondary | ICD-10-CM | POA: Diagnosis not present

## 2023-01-25 DIAGNOSIS — I5022 Chronic systolic (congestive) heart failure: Secondary | ICD-10-CM | POA: Diagnosis not present

## 2023-01-25 DIAGNOSIS — E1122 Type 2 diabetes mellitus with diabetic chronic kidney disease: Secondary | ICD-10-CM

## 2023-01-25 DIAGNOSIS — N182 Chronic kidney disease, stage 2 (mild): Secondary | ICD-10-CM | POA: Diagnosis not present

## 2023-01-25 DIAGNOSIS — N481 Balanitis: Secondary | ICD-10-CM

## 2023-01-25 DIAGNOSIS — E11622 Type 2 diabetes mellitus with other skin ulcer: Secondary | ICD-10-CM | POA: Insufficient documentation

## 2023-01-25 DIAGNOSIS — I6930 Unspecified sequelae of cerebral infarction: Secondary | ICD-10-CM | POA: Diagnosis not present

## 2023-01-25 DIAGNOSIS — L97822 Non-pressure chronic ulcer of other part of left lower leg with fat layer exposed: Secondary | ICD-10-CM | POA: Diagnosis not present

## 2023-01-25 DIAGNOSIS — I872 Venous insufficiency (chronic) (peripheral): Secondary | ICD-10-CM | POA: Insufficient documentation

## 2023-01-25 DIAGNOSIS — L97322 Non-pressure chronic ulcer of left ankle with fat layer exposed: Secondary | ICD-10-CM | POA: Diagnosis not present

## 2023-01-25 DIAGNOSIS — B3742 Candidal balanitis: Secondary | ICD-10-CM | POA: Diagnosis not present

## 2023-01-25 MED ORDER — FLUCONAZOLE 150 MG PO TABS: ORAL_TABLET | ORAL | 1 refills | Status: DC

## 2023-01-25 MED ORDER — CLOTRIMAZOLE-BETAMETHASONE 1-0.05 % EX CREA: TOPICAL_CREAM | CUTANEOUS | 3 refills | Status: DC

## 2023-01-27 NOTE — Progress Notes (Signed)
Earl, [name] ([id number]) [id number] Page 1 of 13 Visit Report for 01/25/2023 Arrival Information Details Patient Name: Date of Service: TO Earl [name], [place] [name] 01/25/2023 11:00 A M Medical Record Number: [id number] Patient Account Number: [id number] Date of Birth/Sex: Treating RN: April 22, 1941 (82 y.o. [name] Primary Care Marcelles Clinard: Kirtland Bouchard Other Clinician: Referring Mack Alvidrez: Treating Aurora Rody/Extender: Collie Siad in Treatment: 2 Visit Information History Since Last Visit Added or deleted any medications: No Patient Arrived: Earl [name] Any new allergies or adverse reactions: No Arrival Time: 11:15 Had a fall or experienced change in No Accompanied By: daughter activities of daily living that may affect Transfer Assistance: Manual risk of falls: Patient Identification Verified: Yes Signs or symptoms of abuse/neglect since last visito No Secondary Verification Process Completed: Yes Hospitalized since last visit: No Patient Requires Transmission-Based Precautions: No Implantable device outside of the clinic excluding No Patient Has Alerts: Yes cellular tissue based products placed in the center Patient Alerts: Patient on Blood Thinner since last visit: Has Compression in Place as Prescribed: Yes Pain Present Now: Yes Electronic Signature(s) Signed: 01/25/2023 12:08:08 PM By: Blanche East RN Entered By: Blanche East on 01/25/2023 12:08:08 -------------------------------------------------------------------------------- Compression Therapy Details Patient Name: Date of Service: TO Earl [name], NA [name] 01/25/2023 11:00 A M Medical Record Number: [id number] Patient Account Number: [id number] Date of Birth/Sex: Treating RN: 06-22-1941 (82 y.o. [name] Primary Care Nikira Kushnir: Kirtland Bouchard Other Clinician: Referring Anabela Crayton: Treating Cyndie Woodbeck/Extender: Marily Lente Weeks in  Treatment: 2 Compression Therapy Performed for Wound Assessment: Wound #4 Left,Medial,Anterior Ankle Performed By: Clinician Blanche East, RN Compression Type: Three Layer Post Procedure Diagnosis Same as Pre-procedure Electronic Signature(s) Signed: 01/25/2023 4:54:22 PM By: Blanche East RN Entered By: Blanche East on 01/25/2023 11:50:27 -------------------------------------------------------------------------------- Encounter Discharge Information Details Patient Name: Date of Service: TO Earl [name], NA [name] 01/25/2023 11:00 A M Medical Record Number: [id number] Patient Account Number: [id number] Date of Birth/Sex: Treating RN: Dec 09, 1940 (82 y.o. [name] Primary Care Earl [name]: Kirtland Bouchard Other Clinician: Referring Preslee Regas: Treating Dinah Lupa/Extender: Collie Siad in Treatment: 2 Encounter Discharge Information Items Post Procedure Vitals Discharge Condition: Stable Temperature (F): 97.7 Ambulatory Status: Walker Pulse (bpm): 64 Discharge Destination: Home Respiratory Rate (breaths/min): 18 Transportation: Private Auto Blood Pressure (mmHg): 101/66 Accompanied By: daughter BLAIKE, PADUANO ([id number]) [id number] Page 2 of 13 Schedule Follow-up Appointment: Yes Clinical Summary of Care: Electronic Signature(s) Signed: 01/25/2023 12:11:11 PM By: Blanche East RN Entered By: Blanche East on 01/25/2023 12:11:10 -------------------------------------------------------------------------------- Lower Extremity Assessment Details Patient Name: Date of Service: TO Earl [name] [name] 01/25/2023 11:00 A M Medical Record Number: [id number] Patient Account Number: [id number] Date of Birth/Sex: Treating RN: 14-Jul-1941 (82 y.o. [name] Primary Care Troi Bechtold: Kirtland Bouchard Other Clinician: Referring Keionna Kinnaird: Treating Wymon Swaney/Extender: Marily Lente Weeks in Treatment: 2 Edema  Assessment Assessed: [Left: No] [Right: No] [Left: Edema] [Right: :] Calf Left: Right: Point of Measurement: From Medial Instep 34 cm 34 cm Ankle Left: Right: Point of Measurement: From Medial Instep 22 cm 21.8 cm Vascular Assessment Pulses: Dorsalis Pedis Palpable: [Left:Yes] [Right:Yes] Electronic Signature(s) Signed: 01/25/2023 12:09:11 PM By: Blanche East RN Entered By: Blanche East on 01/25/2023 12:09:11 -------------------------------------------------------------------------------- Multi Wound Chart Details Patient Name: Date of Service: TO Earl [name], NA [name] 01/25/2023 11:00 A M Medical Record Number: [id number] Patient Account Number: [id number] Date of Birth/Sex: Treating RN: Aug 20, 1941 (82 y.o. M) Primary Care Earl [name]: Melford Aase,  Lendon Ka Other Clinician: Referring Ninoshka Wainwright: Treating Balin Vandegrift/Extender: Marily Lente Weeks in Treatment: 2 Vital Signs Height(in): 71 Pulse(bpm): 64 Weight(lbs): 151 Blood Pressure(mmHg): 101/66 Body Mass Index(BMI): 21.1 Temperature(F): 97.7 Respiratory Rate(breaths/min): 18 [1:Photos:] [3:[id number] Page 3 of 13] Right, Medial Lower Leg Right, Anterior Lower Leg Right, Posterior Lower Leg Wound Location: Blister Blister Blister Wounding Event: Diabetic Wound/Ulcer of the Lower Diabetic Wound/Ulcer of the Lower Diabetic Wound/Ulcer of the Lower Primary Etiology: Extremity Extremity Extremity Arrhythmia, Congestive Heart Failure, Arrhythmia, Congestive Heart Failure, Arrhythmia, Congestive Heart Failure, Comorbid History: Hypertension, Type II Diabetes Hypertension, Type II Diabetes Hypertension, Type II Diabetes 11/23/2022 11/23/2022 11/23/2022 Date A cquired: '2 2 2 '$ Weeks of Treatment: Healed - Epithelialized Healed - Epithelialized Healed - Epithelialized Wound Status: No No No Wound Recurrence: 0x0x0 0x0x0 0x0x0 Measurements L x W x D (cm) 0 0 0 A (cm) : rea 0 0 0 Volume  (cm) : 88.70% 100.00% 100.00% % Reduction in A rea: 85.70% 100.00% 100.00% % Reduction in Volume: Grade 1 Grade 1 Grade 1 Classification: Medium Medium Medium Exudate A mount: Serous Serous Serous Exudate Type: amber amber amber Exudate Color: Medium (34-66%) Large (67-100%) Large (67-100%) Granulation A mount: Pink Pink Pink Granulation Quality: Medium (34-66%) Small (1-33%) Small (1-33%) Necrotic A mount: N/A Eschar Eschar Necrotic Tissue: Fat Layer (Subcutaneous Tissue): Yes Fat Layer (Subcutaneous Tissue): Yes Fat Layer (Subcutaneous Tissue): Yes Exposed Structures: Fascia: No Fascia: No Fascia: No Tendon: No Tendon: No Tendon: No Muscle: No Muscle: No Muscle: No Joint: No Joint: No Joint: No Bone: No Bone: No Small (1-33%) Small (1-33%) Small (1-33%) Epithelialization: N/A N/A N/A Debridement: N/A N/A N/A Pain Control: N/A N/A N/A Tissue Debrided: N/A N/A N/A Level: N/A N/A N/A Debridement A (sq cm): rea N/A N/A N/A Instrument: N/A N/A N/A Bleeding: N/A N/A N/A Hemostasis A chieved: Debridement Treatment Response: N/A N/A N/A Post Debridement Measurements L x N/A N/A N/A W x D (cm) N/A N/A N/A Post Debridement Volume: (cm) No Abnormalities Noted No Abnormalities Noted No Abnormalities Noted Periwound Skin Texture: Maceration: Yes Maceration: Yes Dry/Scaly: Yes Periwound Skin Moisture: Dry/Scaly: No Dry/Scaly: No No Abnormalities Noted Rubor: Yes Rubor: Yes Periwound Skin Color: N/A No Abnormality No Abnormality Temperature: N/A Yes Yes Tenderness on Palpation: N/A N/A N/A Procedures Performed: Wound Number: 4 4 N/A Photos: No Photos N/A Left, Medial, Anterior Ankle Left, Medial, Anterior Ankle N/A Wound Location: Blister Blister N/A Wounding Event: Diabetic Wound/Ulcer of the Lower Diabetic Wound/Ulcer of the Lower N/A Primary Etiology: Extremity Extremity Arrhythmia, Congestive Heart Failure, Arrhythmia, Congestive Heart  Failure, N/A Comorbid History: Hypertension, Type II Diabetes Hypertension, Type II Diabetes 01/25/2023 01/25/2023 N/A Date Acquired: 0 0 N/A Weeks of Treatment: Open Open N/A Wound Status: No No N/A Wound Recurrence: 0.4x0.4x0.1 0.4x0.4x0.1 N/A Measurements L x W x D (cm) 0.126 0.126 N/A A (cm) : rea 0.013 0.013 N/A Volume (cm) : N/A N/A N/A % Reduction in A rea: N/A N/A N/A % Reduction in Volume: Grade 1 Grade 1 N/A Classification: Medium Medium N/A Exudate A mount: Serous Serous N/A Exudate Type: amber amber N/A Exudate Color: Medium (34-66%) Medium (34-66%) N/A Granulation A mount: N/A N/A N/A Granulation Quality: Medium (34-66%) Medium (34-66%) N/A Necrotic A mount: Adherent Slough N/A N/A Necrotic TissueZED, KANALEY ([id number]) [id number] Page 4 of 13 Fat Layer (Subcutaneous Tissue): Yes Fat Layer (Subcutaneous Tissue): Yes N/A Exposed Structures: Fascia: No Fascia: No Tendon: No Tendon: No Muscle: No Muscle: No Joint: No Joint: No  Small (1-33%) Small (1-33%) N/A Epithelialization: Debridement - Selective/Open Wound N/A N/A Debridement: Pre-procedure Verification/Time Out 11:40 N/A N/A Taken: Lidocaine 5% topical ointment N/A N/A Pain Control: Slough N/A N/A Tissue Debrided: Non-Viable Tissue N/A N/A Level: 0.16 N/A N/A Debridement A (sq cm): rea Curette N/A N/A Instrument: Minimum N/A N/A Bleeding: Pressure N/A N/A Hemostasis A chieved: Procedure was tolerated well N/A N/A Debridement Treatment Response: 0.4x0.4x0.1 N/A N/A Post Debridement Measurements L x W x D (cm) 0.013 N/A N/A Post Debridement Volume: (cm) Dry/Scaly: Yes Dry/Scaly: Yes N/A Periwound Skin Moisture: Maceration: No Maceration: No No Abnormalities Noted No Abnormalities Noted N/A Periwound Skin Color: No Abnormality No Abnormality N/A Temperature: Yes Yes N/A Tenderness on Palpation: Compression Therapy N/A  N/A Procedures Performed: Debridement Treatment Notes Wound #1 (Lower Leg) Wound Laterality: Right, Medial Cleanser Peri-Wound Care Topical Primary Dressing Secondary Dressing Secured With Compression Wrap Compression Stockings Add-Ons Wound #2 (Lower Leg) Wound Laterality: Right, Anterior Cleanser Peri-Wound Care Topical Primary Dressing Secondary Dressing Secured With Compression Wrap Compression Stockings Add-Ons Wound #3 (Lower Leg) Wound Laterality: Right, Posterior Cleanser Peri-Wound Care Topical Primary Dressing Secondary Dressing Secured With Compression DORRANCE, DEITCH ([id number]) [id number] Page 5 of 13 Compression Stockings Add-Ons Wound #4 (Ankle) Wound Laterality: Left, Medial, Anterior Cleanser Soap and Water Discharge Instruction: May shower and wash wound with dial antibacterial soap and water prior to dressing change. Wound Cleanser Discharge Instruction: Cleanse the wound with wound cleanser prior to applying a clean dressing using gauze sponges, not tissue or cotton balls. Peri-Wound Care Sween Lotion (Moisturizing lotion) Discharge Instruction: Apply moisturizing lotion as directed Topical Primary Dressing Maxorb Extra Calcium Alginate, 2x2 (in/in) Discharge Instruction: Apply to wound bed as instructed Secondary Dressing ABD Pad, 8x10 Discharge Instruction: Apply over primary dressing as directed. Secured With Compression Wrap ThreePress (3 layer compression wrap) Discharge Instruction: Apply three layer compression as directed. Compression Stockings Add-Ons Electronic Signature(s) Signed: 01/25/2023 12:29:31 PM By: Fredirick Maudlin MD FACS Entered By: Fredirick Maudlin on 01/25/2023 12:29:31 -------------------------------------------------------------------------------- Multi-Disciplinary Care Plan Details Patient Name: Date of Service: TO Earl [name], NA [name] 01/25/2023 11:00 A M Medical Record  Number: [id number] Patient Account Number: [id number] Date of Birth/Sex: Treating RN: 12/08/1940 (82 y.o. [name] Primary Care Lyden Redner: Kirtland Bouchard Other Clinician: Referring Goebel Hellums: Treating Adalbert Alberto/Extender: Collie Siad in Treatment: 2 Active Inactive Nutrition Nursing Diagnoses: Potential for alteratiion in Nutrition/Potential for imbalanced nutrition Goals: Patient/caregiver verbalizes understanding of need to maintain therapeutic glucose control per primary care physician Date Initiated: 01/09/2023 Target Resolution Date: 02/16/2023 Goal Status: Active Interventions: Provide education on elevated blood sugars and impact on wound healing Provide education on nutrition Treatment Activities: Dietary management education, guidance and counseling : 01/09/2023 Giving encouragement to exercise : 01/09/2023 Special diet education : 01/09/2023 [name] ([id number]) [id number] Page 6 of 13 Notes: Orientation to the Wound Care Program Nursing Diagnoses: Knowledge deficit related to the wound healing center program Goals: Patient/caregiver will verbalize understanding of the [facility] Date Initiated: 01/09/2023 Target Resolution Date: 01/25/2023 Goal Status: Active Interventions: Provide education on orientation to the wound center Notes: Wound/Skin Impairment Nursing Diagnoses: Knowledge deficit related to ulceration/compromised skin integrity Goals: Ulcer/skin breakdown will have a volume reduction of 30% by week 4 Date Initiated: 01/09/2023 Target Resolution Date: 02/15/2023 Goal Status: Active Interventions: Assess ulceration(s) every visit Notes: Electronic Signature(s) Signed: 01/25/2023 12:09:48 PM By: Blanche East RN Entered By: Blanche East on 01/25/2023 12:09:47 -------------------------------------------------------------------------------- Pain Assessment  Details Patient Name:  Date of Service: TO Earl [name] [name] 01/25/2023 11:00 A M Medical Record Number: [id number] Patient Account Number: [id number] Date of Birth/Sex: Treating RN: Jan 12, 1941 (82 y.o. [name] Primary Care Hughey Rittenberry: Kirtland Bouchard Other Clinician: Referring Alyda Megna: Treating Lakeyia Surber/Extender: Collie Siad in Treatment: 2 Active Problems Location of Pain Severity and Description of Pain Patient Has Paino Yes Site Locations Pain Location: Pain in Ulcers Rate the pain. Current Pain Level: 2 Character of Pain Describe the Pain: Aching Pain Management and Medication LINDY, YELINEK ([id number]) [id number] Page 7 of 13 Current Pain Management: Electronic Signature(s) Signed: 01/25/2023 12:08:41 PM By: Blanche East RN Entered By: Blanche East on 01/25/2023 12:08:41 -------------------------------------------------------------------------------- Patient/Caregiver Education Details Patient Name: Date of Service: TO Earl [name], NA [name] 3/6/2024andnbsp11:00 A M Medical Record Number: [id number] Patient Account Number: [id number] Date of Birth/Gender: Treating RN: May 15, 1941 (82 y.o. [name] Primary Care Physician: Kirtland Bouchard Other Clinician: Referring Physician: Treating Physician/Extender: Collie Siad in Treatment: 2 Education Assessment Education Provided To: Patient Education Topics Provided Wound Debridement: Methods: Explain/Verbal Responses: Reinforcements needed, State content correctly Wound/Skin Impairment: Methods: Explain/Verbal Responses: Reinforcements needed, State content correctly Electronic Signature(s) Signed: 01/25/2023 4:54:22 PM By: Blanche East RN Entered By: Blanche East on 01/25/2023 12:10:05 -------------------------------------------------------------------------------- Wound Assessment Details Patient Name: Date of  Service: TO Earl [name], NA [name] 01/25/2023 11:00 A M Medical Record Number: [id number] Patient Account Number: [id number] Date of Birth/Sex: Treating RN: 1941/05/08 (82 y.o. [name] Primary Care Damyra Luscher: Kirtland Bouchard Other Clinician: Referring Geovanie Winnett: Treating [place] Huberty/Extender: Marily Lente Weeks in Treatment: 2 Wound Status Wound Number: 1 Primary Diabetic Wound/Ulcer of the Lower Extremity Etiology: Wound Location: Right, Medial Lower Leg Wound Status: Healed - Epithelialized Wounding Event: Blister Comorbid Arrhythmia, Congestive Heart Failure, Hypertension, Type II Date Acquired: 11/23/2022 History: Diabetes Weeks Of Treatment: 2 Clustered Wound: No Photos ROQUAN, HARTONG ([id number]) [id number] Page 8 of 13 Wound Measurements Length: (cm) Width: (cm) Depth: (cm) Area: (cm) Volume: (cm) 0 % Reduction in Area: 88.7% 0 % Reduction in Volume: 85.7% 0 Epithelialization: Small (1-33%) 0 Tunneling: No 0 Undermining: No Wound Description Classification: Grade 1 Exudate Amount: Medium Exudate Type: Serous Exudate Color: amber Foul Odor After Cleansing: No Slough/Fibrino Yes Wound Bed Granulation Amount: Medium (34-66%) Exposed Structure Granulation Quality: Pink Fascia Exposed: No Necrotic Amount: Medium (34-66%) Fat Layer (Subcutaneous Tissue) Exposed: Yes Tendon Exposed: No Muscle Exposed: No Joint Exposed: No Periwound Skin Texture Texture Color No Abnormalities Noted: Yes No Abnormalities Noted: Yes Moisture No Abnormalities Noted: No Dry / Scaly: No Maceration: Yes Treatment Notes Wound #1 (Lower Leg) Wound Laterality: Right, Medial Cleanser Peri-Wound Care Topical Primary Dressing Secondary Dressing Secured With Compression Wrap Compression Stockings Add-Ons Electronic Signature(s) Signed: 01/25/2023 4:54:22 PM By: Blanche East RN Entered By: Blanche East on 01/25/2023  11:50:10 -------------------------------------------------------------------------------- Wound Assessment Details Patient Name: Date of Service: TO Earl [name], NA [name] 01/25/2023 11:00 A M Medical Record Number: [id number] Patient Account Number: [id number] Date of Birth/Sex: Treating RN: [birth year] (82 y.o. [name] Primary Care Inas Avena: Kirtland Bouchard Other Clinician: Referring Raymonde Hamblin: Treating Jewelianna Pancoast/Extender: Marily Lente Weeks in Treatment: 2 Wound Status Wound Number: 2 Primary Diabetic Wound/Ulcer of the Lower Extremity Etiology: Wound Location: Right, Anterior Lower Leg Wound Status: Healed - Epithelialized Wounding Event: Blister Comorbid Arrhythmia, Congestive Heart Failure, Hypertension, Type II Date Acquired: 11/23/2022 History: Diabetes Weeks Of Treatment: 2 Clustered Wound:  No MACKINLEY, BULLEN ([id number]) [id number] Page 9 of 13 Photos Wound Measurements Length: (cm) Width: (cm) Depth: (cm) Area: (cm) Volume: (cm) 0 % Reduction in Area: 100% 0 % Reduction in Volume: 100% 0 Epithelialization: Small (1-33%) 0 Tunneling: No 0 Undermining: No Wound Description Classification: Grade 1 Exudate Amount: Medium Exudate Type: Serous Exudate Color: amber Foul Odor After Cleansing: No Slough/Fibrino Yes Wound Bed Granulation Amount: Large (67-100%) Exposed Structure Granulation Quality: Pink Fascia Exposed: No Necrotic Amount: Small (1-33%) Fat Layer (Subcutaneous Tissue) Exposed: Yes Necrotic Quality: Eschar Tendon Exposed: No Muscle Exposed: No Joint Exposed: No Bone Exposed: No Periwound Skin Texture Texture Color No Abnormalities Noted: Yes No Abnormalities Noted: No Rubor: Yes Moisture No Abnormalities Noted: No Temperature / Pain Dry / Scaly: No Temperature: No Abnormality Maceration: Yes Tenderness on Palpation: Yes Treatment Notes Wound #2 (Lower Leg) Wound Laterality:  Right, Anterior Cleanser Peri-Wound Care Topical Primary Dressing Secondary Dressing Secured With Compression Wrap Compression Stockings Add-Ons Electronic Signature(s) Signed: 01/25/2023 4:54:22 PM By: Blanche East RN Entered By: Blanche East on 01/25/2023 11:50:10 -------------------------------------------------------------------------------- Wound Assessment Details Patient Name: Date of Service: TO Earl [name], NA [name] 01/25/2023 11:00 A ASIE, GRONAU ([id number]) [id number] Page 10 of 13 Medical Record Number: [id number] Patient Account Number: [id number] Date of Birth/Sex: Treating RN: [birth year] (82 y.o. [name] Primary Care Briann Sarchet: Kirtland Bouchard Other Clinician: Referring Daneshia Tavano: Treating Travone Georg/Extender: Marily Lente Weeks in Treatment: 2 Wound Status Wound Number: 3 Primary Diabetic Wound/Ulcer of the Lower Extremity Etiology: Wound Location: Right, Posterior Lower Leg Wound Status: Healed - Epithelialized Wounding Event: Blister Comorbid Arrhythmia, Congestive Heart Failure, Hypertension, Type II Date Acquired: 11/23/2022 History: Diabetes Weeks Of Treatment: 2 Clustered Wound: No Photos Wound Measurements Length: (cm) Width: (cm) Depth: (cm) Area: (cm) Volume: (cm) 0 % Reduction in Area: 100% 0 % Reduction in Volume: 100% 0 Epithelialization: Small (1-33%) 0 Tunneling: No 0 Undermining: No Wound Description Classification: Grade 1 Exudate Amount: Medium Exudate Type: Serous Exudate Color: amber Foul Odor After Cleansing: No Slough/Fibrino Yes Wound Bed Granulation Amount: Large (67-100%) Exposed Structure Granulation Quality: Pink Fascia Exposed: No Necrotic Amount: Small (1-33%) Fat Layer (Subcutaneous Tissue) Exposed: Yes Necrotic Quality: Eschar Tendon Exposed: No Muscle Exposed: No Joint Exposed: No Bone Exposed: No Periwound Skin Texture Texture Color No  Abnormalities Noted: Yes No Abnormalities Noted: No Rubor: Yes Moisture No Abnormalities Noted: No Temperature / Pain Dry / Scaly: Yes Temperature: No Abnormality Tenderness on Palpation: Yes Treatment Notes Wound #3 (Lower Leg) Wound Laterality: Right, Posterior Cleanser Peri-Wound Care Topical Primary Dressing Secondary Dressing Secured With Compression TAITE, MALIN ([id number]) [id number] Page 11 of 13 Compression Stockings Add-Ons Electronic Signature(s) Signed: 01/25/2023 4:54:22 PM By: Blanche East RN Entered By: Blanche East on 01/25/2023 11:50:10 -------------------------------------------------------------------------------- Wound Assessment Details Patient Name: Date of Service: TO Earl [name], NA [name] 01/25/2023 11:00 A M Medical Record Number: [id number] Patient Account Number: [id number] Date of Birth/Sex: Treating RN: 23-Dec-1940 (82 y.o. [name] Primary Care Sulaiman Imbert: Kirtland Bouchard Other Clinician: Referring Deandrew Hoecker: Treating Allaya Abbasi/Extender: Marily Lente Weeks in Treatment: 2 Wound Status Wound Number: 4 Primary Diabetic Wound/Ulcer of the Lower Extremity Etiology: Wound Location: Left, Medial, Anterior Ankle Wound Status: Open Wounding Event: Blister Comorbid Arrhythmia, Congestive Heart Failure, Hypertension, Type II Date Acquired: 01/25/2023 History: Diabetes Weeks Of Treatment: 0 Clustered Wound: No Photos Wound Measurements Length: (cm) 0.4 Width: (cm) 0.4 Depth: (cm) 0.1 Area: (cm) 0.126 Volume: (cm) 0.013 %  Reduction in Area: % Reduction in Volume: Epithelialization: Small (1-33%) Tunneling: No Undermining: No Wound Description Classification: Grade 1 Exudate Amount: Medium Exudate Type: Serous Exudate Color: amber Foul Odor After Cleansing: No Slough/Fibrino Yes Wound Bed Granulation Amount: Medium (34-66%) Exposed Structure Necrotic Amount: Medium  (34-66%) Fascia Exposed: No Necrotic Quality: Adherent Slough Fat Layer (Subcutaneous Tissue) Exposed: Yes Tendon Exposed: No Muscle Exposed: No Joint Exposed: No Periwound Skin Texture Texture Color No Abnormalities Noted: No No Abnormalities Noted: Yes Moisture Temperature / Pain No Abnormalities Noted: No Temperature: No Abnormality Dry / Scaly: Yes Tenderness on Palpation: Yes Maceration: No Treatment Notes CHEIKH, RONNINGEN ([id number]) [id number] Page 12 of 13 Wound #4 (Ankle) Wound Laterality: Left, Medial, Anterior Cleanser Soap and Water Discharge Instruction: May shower and wash wound with dial antibacterial soap and water prior to dressing change. Wound Cleanser Discharge Instruction: Cleanse the wound with wound cleanser prior to applying a clean dressing using gauze sponges, not tissue or cotton balls. Peri-Wound Care Sween Lotion (Moisturizing lotion) Discharge Instruction: Apply moisturizing lotion as directed Topical Primary Dressing Maxorb Extra Calcium Alginate, 2x2 (in/in) Discharge Instruction: Apply to wound bed as instructed Secondary Dressing ABD Pad, 8x10 Discharge Instruction: Apply over primary dressing as directed. Secured With Compression Wrap ThreePress (3 layer compression wrap) Discharge Instruction: Apply three layer compression as directed. Compression Stockings Add-Ons Electronic Signature(s) Signed: 01/25/2023 4:54:22 PM By: Blanche East RN Entered By: Blanche East on 01/25/2023 11:34:05 -------------------------------------------------------------------------------- Wound Assessment Details Patient Name: Date of Service: TO Earl [name], NA [name] 01/25/2023 11:00 A M Medical Record Number: [id number] Patient Account Number: [id number] Date of Birth/Sex: Treating RN: January 31, 1941 (82 y.o. [name] Primary Care Devrin Monforte: Kirtland Bouchard Other Clinician: Referring Gabrella Stroh: Treating Madisson Kulaga/Extender:  Marily Lente Weeks in Treatment: 2 Wound Status Wound Number: 4 Primary Diabetic Wound/Ulcer of the Lower Extremity Etiology: Wound Location: Left, Medial, Anterior Ankle Wound Status: Open Wounding Event: Blister Comorbid Arrhythmia, Congestive Heart Failure, Hypertension, Type II Date Acquired: 01/25/2023 History: Diabetes Weeks Of Treatment: 0 Clustered Wound: No Wound Measurements Length: (cm) 0.4 Width: (cm) 0.4 Depth: (cm) 0.1 Area: (cm) 0.126 Volume: (cm) 0.013 % Reduction in Area: % Reduction in Volume: Epithelialization: Small (1-33%) Tunneling: No Undermining: No Wound Description Classification: Grade 1 Exudate Amount: Medium Exudate Type: Serous Exudate Color: amber Foul Odor After Cleansing: No Slough/Fibrino Yes Wound Bed Granulation Amount: Medium (34-66%) Exposed Structure Necrotic Amount: Medium (34-66%) Fascia Exposed: No Fat Layer (Subcutaneous Tissue) Exposed: Yes Tendon Exposed: No JAE, PETCH ([id number]) [id number] Page 13 of 13 Muscle Exposed: No Joint Exposed: No Periwound Skin Texture Texture Color No Abnormalities Noted: No No Abnormalities Noted: Yes Moisture Temperature / Pain No Abnormalities Noted: No Temperature: No Abnormality Dry / Scaly: Yes Tenderness on Palpation: Yes Maceration: No Electronic Signature(s) Signed: 01/25/2023 4:54:22 PM By: Blanche East RN Entered By: Blanche East on 01/25/2023 11:41:17 -------------------------------------------------------------------------------- Vitals Details Patient Name: Date of Service: TO Earl [name], NA [name] 01/25/2023 11:00 A M Medical Record Number: [id number] Patient Account Number: [id number] Date of Birth/Sex: Treating RN: Sep 30, 1941 (82 y.o. [name] Primary Care Madoline Bhatt: Kirtland Bouchard Other Clinician: Referring Kyasia Steuck: Treating Makaria Poarch/Extender: Marily Lente Weeks in  Treatment: 2 Vital Signs Time Taken: 11:20 Temperature (F): 97.7 Height (in): 71 Pulse (bpm): 64 Weight (lbs): 151 Respiratory Rate (breaths/min): 18 Body Mass Index (BMI): 21.1 Blood Pressure (mmHg): 101/66 Reference Range: 80 - 120 mg / dl Electronic Signature(s) Signed: 01/25/2023 12:08:26 PM By: Blanche East  RN Entered By: Blanche East on 01/25/2023 12:08:26

## 2023-01-27 NOTE — Progress Notes (Signed)
MAICO, WECKER ([id number]) [id number] Page 1 of 8 Visit Report for 01/25/2023 Chief Complaint Document Details Patient Name: Date of Service: TO Earl [name], [place] [name] 01/25/2023 11:00 A M Medical Record Number: [id number] Patient Account Number: [id number] Date of Birth/Sex: Treating RN: November 05, 1941 (82 y.o. M) Primary Care Provider: Kirtland Bouchard Other Clinician: Referring Provider: Treating Provider/Extender: Collie Siad in Treatment: 2 Information Obtained from: Patient Chief Complaint Patient presents for treatment of an open ulcer due to venous insufficiency Electronic Signature(s) Signed: 01/25/2023 12:29:38 PM By: Fredirick Maudlin MD FACS Entered By: Fredirick Maudlin on 01/25/2023 12:29:38 -------------------------------------------------------------------------------- Debridement Details Patient Name: Date of Service: TO Earl [name], NA [name] 01/25/2023 11:00 A M Medical Record Number: [id number] Patient Account Number: [id number] Date of Birth/Sex: Treating RN: 06-Aug-1941 (82 y.o. [name] Primary Care Provider: Kirtland Bouchard Other Clinician: Referring Provider: Treating Provider/Extender: Collie Siad in Treatment: 2 Debridement Performed for Assessment: Wound #4 Left,Medial,Anterior Ankle Performed By: Physician Fredirick Maudlin, MD Debridement Type: Debridement Severity of Tissue Pre Debridement: Fat layer exposed Level of Consciousness (Pre-procedure): Awake and Alert Pre-procedure Verification/Time Out Yes - 11:40 Taken: Start Time: 11:40 Pain Control: Lidocaine 5% topical ointment T Area Debrided (L x W): otal 0.4 (cm) x 0.4 (cm) = 0.16 (cm) Tissue and other material debrided: Non-Viable, Slough, Slough Level: Non-Viable Tissue Debridement Description: Selective/Open Wound Instrument: Curette Bleeding: Minimum Hemostasis Achieved: Pressure Response to  Treatment: Procedure was tolerated well Level of Consciousness (Post- Awake and Alert procedure): Post Debridement Measurements of Total Wound Length: (cm) 0.4 Width: (cm) 0.4 Depth: (cm) 0.1 Volume: (cm) 0.013 Character of Wound/Ulcer Post Debridement: Requires Further Debridement Severity of Tissue Post Debridement: Fat layer exposed Post Procedure Diagnosis Same as Pre-procedure Notes scribed for Dr. Celine Ahr by Blanche East RN Electronic Signature(s) Signed: 01/25/2023 12:55:37 PM By: Fredirick Maudlin MD FACS Signed: 01/25/2023 4:54:22 PM By: Blanche East RN Entered By: Blanche East on 01/25/2023 11:51:15 Earl [name] ([id number]) [id number] Page 2 of 8 -------------------------------------------------------------------------------- HPI Details Patient Name: Date of Service: TO Earl [name], [place] [name] 01/25/2023 11:00 A M Medical Record Number: [id number] Patient Account Number: [id number] Date of Birth/Sex: Treating RN: [birth year] (82 y.o. 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 Near the beginning of the year, he had increased swelling in his legs and developed an ulcer on his right calf. Apparently this has been treated with Betadine sugar dressings per his PCPs office. His right leg has multiple superficial open ulcers that all look like they started with blisters. There is slough accumulation in the posterior calf ulcer. He has a palpable pedal pulse. The skin of both legs is tight and shiny. 01/18/2023: The silver alginate that we applied last week has completely stuck down to all of the open wound sites.  Edema control is improved. 01/25/2023: All of the wounds on his right leg have healed. Unfortunately, a small wound has opened on his left medial lower leg, just medial and proximal to the ankle. It is small and circular and just penetrates the fat layer. Electronic Signature(s) Signed: 01/25/2023 12:30:20 PM By: Fredirick Maudlin MD FACS Entered By: Fredirick Maudlin on 01/25/2023 12:30:20 -------------------------------------------------------------------------------- Physical Exam Details Patient Name: Date  of Service: TO Earl [name], NA [name] 01/25/2023 11:00 A M Medical Record Number: [id number] Patient Account Number: [id number] Date of Birth/Sex: Treating RN: [birth year] (82 y.o. M) Primary Care Provider: Kirtland Bouchard Other Clinician: Referring Provider: Treating Provider/Extender: Marily Lente Weeks in Treatment: 2 Constitutional . . . . no acute distress. Respiratory Normal work of breathing on room air. Notes 01/25/2023: All of the wounds on his right leg have healed. Unfortunately, a small wound has opened on his left medial lower leg, just medial and proximal to the ankle. It is small and circular and just penetrates the fat layer. Electronic Signature(s) Signed: 01/25/2023 12:30:54 PM By: Fredirick Maudlin MD FACS Entered By: Fredirick Maudlin on 01/25/2023 12:30:54 -------------------------------------------------------------------------------- Physician Orders Details Patient Name: Date of Service: TO Earl [name], NA [name] 01/25/2023 11:00 A M Medical Record Number: [id number] Patient Account Number: [id number] Date of Birth/Sex: Treating RN: Oct 18, 1941 (82 y.o. [name] Primary Care Provider: Kirtland Bouchard Other Clinician: Referring Provider: Treating Provider/Extender: Collie Siad in Treatment: 2 Verbal / Phone Orders: No Diagnosis Coding Earl [name], Earl [name] ([id number]) [id number]  Page 3 of 8 ICD-10 Coding Code Description [phone] Non-pressure chronic ulcer of other part of left lower leg with fat layer exposed I87.2 Venous insufficiency (chronic) (peripheral) E11.622 Type 2 diabetes mellitus with other skin ulcer [id number] Chronic systolic (congestive) heart failure I69.30 Unspecified sequelae of cerebral infarction N18.2 Chronic kidney disease, stage 2 (mild) Follow-up Appointments ppointment in 1 week. - Dr. Celine Ahr Rm 4 Return A Bathing/ Shower/ Hygiene May shower with protection but do not get wound dressing(s) wet. Protect dressing(s) with water repellant cover (for example, large plastic bag) or a cast cover and may then take shower. - May purchase a Cast protector from [facility] or [facility]. Please keep dressing clean and dry at all times Edema Control - Lymphedema / SCD / Other Right Lower Extremity Segmental Compressive Device. Use the Segmental Compressive Device on leg(s) 2-3 times a day for 45 - 60 minutes. If wearing any wraps or hose, do not remove them. Continue exercising as instructed. Elevate legs to the level of the heart or above for 30 minutes daily and/or when sitting for 3-4 times a day throughout the day. Patient to wear own compression stockings every day. - On left leg Compression stocking or Garment 20-30 mm/Hg pressure to: - on Left leg Wound Treatment Wound #4 - Ankle Wound Laterality: Left, Medial, Anterior Cleanser: Soap and Water Discharge Instructions: May shower and wash wound with dial antibacterial soap and water prior to dressing change. Cleanser: Wound Cleanser Discharge Instructions: Cleanse the wound with wound cleanser prior to applying a clean dressing using gauze sponges, not tissue or cotton balls. Peri-Wound Care: Sween Lotion (Moisturizing lotion) Discharge Instructions: Apply moisturizing lotion as directed Prim Dressing: Maxorb Extra Calcium Alginate, 2x2 (in/in) ary Discharge Instructions: Apply to wound bed as  instructed Secondary Dressing: ABD Pad, 8x10 Discharge Instructions: Apply over primary dressing as directed. Compression Wrap: ThreePress (3 layer compression wrap) Discharge Instructions: Apply three layer compression as directed. Electronic Signature(s) Signed: 01/25/2023 12:55:37 PM By: Fredirick Maudlin MD FACS Entered By: Fredirick Maudlin on 01/25/2023 12:31:15 -------------------------------------------------------------------------------- Problem List Details Patient Name: Date of Service: TO Earl [name], NA [name] 01/25/2023 11:00 A M Medical Record Number: [id number] Patient Account Number: [id number] Date of Birth/Sex: Treating RN: 10-Mar-1941 (82 y.o. M) Primary Care Provider: Kirtland Bouchard Other Clinician: Referring Provider: Treating Provider/Extender: Collie Siad  in Treatment: 2 Active Problems ICD-10 Encounter Code Description Active Date MDM Diagnosis L97.822 Non-pressure chronic ulcer of other part of left lower leg with fat layer exposed3/04/2023 No Yes I87.2 Venous insufficiency (chronic) (peripheral) 01/09/2023 No Yes Earl [name], Earl [name] ([id number]) [id number] Page 4 of 8 E11.622 Type 2 diabetes mellitus with other skin ulcer 01/09/2023 No Yes [id number] Chronic systolic (congestive) heart failure 01/09/2023 No Yes I69.30 Unspecified sequelae of cerebral infarction 01/09/2023 No Yes N18.2 Chronic kidney disease, stage 2 (mild) 01/09/2023 No Yes Inactive Problems Resolved Problems ICD-10 Code Description Active Date Resolved Date L97.211 Non-pressure chronic ulcer of right calf limited to breakdown of skin 01/09/2023 01/09/2023 L97.811 Non-pressure chronic ulcer of other part of right lower leg limited to breakdown of skin 01/09/2023 01/09/2023 Electronic Signature(s) Signed: 01/25/2023 12:28:43 PM By: Fredirick Maudlin MD FACS Entered By: Fredirick Maudlin on 01/25/2023  12:28:42 -------------------------------------------------------------------------------- Progress Note Details Patient Name: Date of Service: TO Earl [name], NA [name] 01/25/2023 11:00 A M Medical Record Number: [id number] Patient Account Number: [id number] Date of Birth/Sex: Treating RN: [birth year] (82 y.o. 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 Near the beginning of the year, he had increased swelling in his legs and developed an ulcer on his right calf. Apparently this has been treated with Betadine sugar dressings per his PCPs office. His right leg has multiple superficial open ulcers that all look like they started with blisters. There is slough accumulation in the posterior calf ulcer. He has a palpable pedal pulse. The skin of both legs is tight and shiny. 01/18/2023: The silver alginate that we applied last week has completely stuck down to all of the open wound sites. Edema control is improved. 01/25/2023: All of the wounds on his right leg have healed. Unfortunately, a small wound has opened on his left medial lower leg, just medial and proximal to the ankle. It is small and circular and just penetrates the fat layer. Patient History Social History Never smoker, Marital Status - Widowed, Alcohol Use - Never, Drug Use - No History, Caffeine Use - Never. Medical History Eyes Denies history of Cataracts, Glaucoma, Optic Neuritis Ear/Nose/Mouth/Throat Earl [name], Earl [name] ([id number]) [id number]  Page 5 of 8 Denies history of Chronic sinus problems/congestion, Middle ear problems Cardiovascular Patient has history of Arrhythmia, Congestive Heart Failure, Hypertension Endocrine Patient has history of Type II Diabetes Genitourinary Denies history of End Stage Renal Disease Neurologic Denies history of Neuropathy Oncologic Denies history of Received Chemotherapy, Received Radiation Psychiatric Denies history of Anorexia/bulimia, Confinement Anxiety Objective Constitutional no acute distress. Vitals Time Taken: 11:20 AM, Height: 71 in, Weight: 151 lbs, BMI: 21.1, Temperature: 97.7 F, Pulse: 64 bpm, Respiratory Rate: 18 breaths/min, Blood Pressure: 101/66 mmHg. Respiratory Normal work of breathing on room air. General Notes: 01/25/2023: All of the wounds on his right leg have healed. Unfortunately, a small wound has opened on his left medial lower leg, just medial and proximal to the ankle. It is small and circular and just penetrates the fat layer. Integumentary (Hair, Skin) Wound #1 status is Healed - Epithelialized. Original cause of wound was Blister. The date acquired was: 11/23/2022. The wound has been  in treatment 2 weeks. The wound is located on the Right,Medial Lower Leg. The wound measures 0cm length x 0cm width x 0cm depth; 0cm^2 area and 0cm^3 volume. There is Fat Layer (Subcutaneous Tissue) exposed. There is no tunneling or undermining noted. There is a medium amount of serous drainage noted. There is medium (34- 66%) pink granulation within the wound bed. There is a medium (34-66%) amount of necrotic tissue within the wound bed. The periwound skin appearance had no abnormalities noted for texture. The periwound skin appearance had no abnormalities noted for color. The periwound skin appearance exhibited: Maceration. The periwound skin appearance did not exhibit: Dry/Scaly. Wound #2 status is Healed - Epithelialized. Original cause of wound was Blister. The date acquired  was: 11/23/2022. The wound has been in treatment 2 weeks. The wound is located on the Right,Anterior Lower Leg. The wound measures 0cm length x 0cm width x 0cm depth; 0cm^2 area and 0cm^3 volume. There is Fat Layer (Subcutaneous Tissue) exposed. There is no tunneling or undermining noted. There is a medium amount of serous drainage noted. There is large (67- 100%) pink granulation within the wound bed. There is a small (1-33%) amount of necrotic tissue within the wound bed including Eschar. The periwound skin appearance had no abnormalities noted for texture. The periwound skin appearance exhibited: Maceration, Rubor. The periwound skin appearance did not exhibit: Dry/Scaly. Periwound temperature was noted as No Abnormality. The periwound has tenderness on palpation. Wound #3 status is Healed - Epithelialized. Original cause of wound was Blister. The date acquired was: 11/23/2022. The wound has been in treatment 2 weeks. The wound is located on the Right,Posterior Lower Leg. The wound measures 0cm length x 0cm width x 0cm depth; 0cm^2 area and 0cm^3 volume. There is Fat Layer (Subcutaneous Tissue) exposed. There is no tunneling or undermining noted. There is a medium amount of serous drainage noted. There is large (67- 100%) pink granulation within the wound bed. There is a small (1-33%) amount of necrotic tissue within the wound bed including Eschar. The periwound skin appearance had no abnormalities noted for texture. The periwound skin appearance exhibited: Dry/Scaly, Rubor. Periwound temperature was noted as No Abnormality. The periwound has tenderness on palpation. Wound #4 status is Open. Original cause of wound was Blister. The date acquired was: 01/25/2023. The wound is located on the Left,Medial,Anterior Ankle. The wound measures 0.4cm length x 0.4cm width x 0.1cm depth; 0.126cm^2 area and 0.013cm^3 volume. There is Fat Layer (Subcutaneous Tissue) exposed. There is no tunneling or undermining noted.  There is a medium amount of serous drainage noted. There is medium (34-66%) granulation within the wound bed. There is a medium (34-66%) amount of necrotic tissue within the wound bed including Adherent Slough. The periwound skin appearance had no abnormalities noted for color. The periwound skin appearance exhibited: Dry/Scaly. The periwound skin appearance did not exhibit: Maceration. Periwound temperature was noted as No Abnormality. The periwound has tenderness on palpation. Wound #4 status is Open. Original cause of wound was Blister. The date acquired was: 01/25/2023. The wound is located on the Left,Medial,Anterior Ankle. The wound measures 0.4cm length x 0.4cm width x 0.1cm depth; 0.126cm^2 area and 0.013cm^3 volume. There is Fat Layer (Subcutaneous Tissue) exposed. There is no tunneling or undermining noted. There is a medium amount of serous drainage noted. There is medium (34-66%) granulation within the wound bed. There is a medium (34-66%) amount of necrotic tissue within the wound bed. The periwound skin appearance had no abnormalities noted for color.  The periwound skin appearance exhibited: Dry/Scaly. The periwound skin appearance did not exhibit: Maceration. Periwound temperature was noted as No Abnormality. The periwound has tenderness on palpation. Assessment Active Problems ICD-10 Non-pressure chronic ulcer of other part of left lower leg with fat layer exposed Venous insufficiency (chronic) (peripheral) Type 2 diabetes mellitus with other skin ulcer Chronic systolic (congestive) heart failure Unspecified sequelae of cerebral infarction Chronic kidney disease, stage 2 (mild) Earl [name], Earl [name] ([id number]) [id number] Page 6 of 8 Procedures Wound #4 Pre-procedure diagnosis of Wound #4 is a Diabetic Wound/Ulcer of the Lower Extremity located on the Left,Medial,Anterior Ankle .Severity of Tissue Pre Debridement is: Fat layer exposed. There was a  Selective/Open Wound Non-Viable Tissue Debridement with a total area of 0.16 sq cm performed by Fredirick Maudlin, MD. With the following instrument(s): Curette to remove Non-Viable tissue/material. Material removed includes [facility] after achieving pain control using Lidocaine 5% topical ointment. No specimens were taken. A time out was conducted at 11:40, prior to the start of the procedure. A Minimum amount of bleeding was controlled with Pressure. The procedure was tolerated well. Post Debridement Measurements: 0.4cm length x 0.4cm width x 0.1cm depth; 0.013cm^3 volume. Character of Wound/Ulcer Post Debridement requires further debridement. Severity of Tissue Post Debridement is: Fat layer exposed. Post procedure Diagnosis Wound #4: Same as Pre-Procedure General Notes: scribed for Dr. Celine Ahr by Blanche East RN. Pre-procedure diagnosis of Wound #4 is a Diabetic Wound/Ulcer of the Lower Extremity located on the Left,Medial,Anterior Ankle . There was a Three Layer Compression Therapy Procedure by Blanche East, RN. Post procedure Diagnosis Wound #4: Same as Pre-Procedure Plan Follow-up Appointments: Return Appointment in 1 week. - Dr. Celine Ahr Rm 4 Bathing/ Shower/ Hygiene: May shower with protection but do not get wound dressing(s) wet. Protect dressing(s) with water repellant cover (for example, large plastic bag) or a cast cover and may then take shower. - May purchase a Cast protector from [facility] or [facility]. Please keep dressing clean and dry at all times Edema Control - Lymphedema / SCD / Other: Segmental Compressive Device. Use the Segmental Compressive Device on leg(s) 2-3 times a day for 45 - 60 minutes. If wearing any wraps or hose, do not remove them. Continue exercising as instructed. Elevate legs to the level of the heart or above for 30 minutes daily and/or when sitting for 3-4 times a day throughout the day. Patient to wear own compression stockings every day. - On left leg Compression  stocking or Garment 20-30 mm/Hg pressure to: - on Left leg WOUND #4: - Ankle Wound Laterality: Left, Medial, Anterior Cleanser: Soap and Water Discharge Instructions: May shower and wash wound with dial antibacterial soap and water prior to dressing change. Cleanser: Wound Cleanser Discharge Instructions: Cleanse the wound with wound cleanser prior to applying a clean dressing using gauze sponges, not tissue or cotton balls. Peri-Wound Care: Sween Lotion (Moisturizing lotion) Discharge Instructions: Apply moisturizing lotion as directed Prim Dressing: Maxorb Extra Calcium Alginate, 2x2 (in/in) ary Discharge Instructions: Apply to wound bed as instructed Secondary Dressing: ABD Pad, 8x10 Discharge Instructions: Apply over primary dressing as directed. Com pression Wrap: ThreePress (3 layer compression wrap) Discharge Instructions: Apply three layer compression as directed. 01/25/2023: All of the wounds on his right leg have healed. Unfortunately, a small wound has opened on his left medial lower leg, just medial and proximal to the ankle. It is small and circular and just penetrates the fat layer. I used a curette debride a small amount of slough from the wound surface.  Will apply silver alginate and 3 layer compression. He does have his compression stockings with him so we applied that to his right leg. Will follow-up in 1 week. Electronic Signature(s) Signed: 01/25/2023 12:31:54 PM By: Fredirick Maudlin MD FACS Entered By: Fredirick Maudlin on 01/25/2023 12:31:54 -------------------------------------------------------------------------------- HxROS Details Patient Name: Date of Service: TO Earl [name], NA [name] 01/25/2023 11:00 A M Medical Record Number: [id number] Patient Account Number: [id number] Date of Birth/Sex: Treating RN: Mar 03, 1941 (82 y.o. M) Primary Care Provider: Kirtland Bouchard Other Clinician: Referring Provider: Treating Provider/Extender: Collie Siad in Treatment: 2 Eyes Earl [name] ([id number]) [id number] Page 7 of 8 Medical History: Negative for: Cataracts; Glaucoma; Optic Neuritis Ear/Nose/Mouth/Throat Medical History: Negative for: Chronic sinus problems/congestion; Middle ear problems Cardiovascular Medical History: Positive for: Arrhythmia; Congestive Heart Failure; Hypertension Endocrine Medical History: Positive for: Type II Diabetes Treated with: Oral agents Blood sugar tested every day: Yes Tested : Genitourinary Medical History: Negative for: End Stage Renal Disease Neurologic Medical History: Negative for: Neuropathy Oncologic Medical History: Negative for: Received Chemotherapy; Received Radiation Psychiatric Medical History: Negative for: Anorexia/bulimia; Confinement Anxiety Immunizations Pneumococcal Vaccine: Received Pneumococcal Vaccination: Yes Received Pneumococcal Vaccination On or After 60th Birthday: Yes Implantable Devices None Family and Social History Never smoker; Marital Status - Widowed; Alcohol Use: Never; Drug Use: No History; Caffeine Use: Never; Financial Concerns: No; Food, Clothing or Shelter Needs: No; Support System Lacking: No; Transportation Concerns: No Electronic Signature(s) Signed: 01/25/2023 12:55:37 PM By: Fredirick Maudlin MD FACS Entered By: Fredirick Maudlin on 01/25/2023 12:30:28 -------------------------------------------------------------------------------- SuperBill Details Patient Name: Date of Service: TO Earl [name], NA [name] 01/25/2023 Medical Record Number: [id number] Patient Account Number: [id number] Date of Birth/Sex: Treating RN: 1941/09/27 (82 y.o. M) Primary Care Provider: Kirtland Bouchard Other Clinician: Referring Provider: Treating Provider/Extender: Collie Siad in Treatment: 2 Diagnosis Coding ICD-10 Codes Code Description [phone] Non-pressure chronic ulcer of other part of  left lower leg with fat layer exposed Earl [name], Earl [name] ([id number]) [id number] Page 8 of 8 I87.2 Venous insufficiency (chronic) (peripheral) E11.622 Type 2 diabetes mellitus with other skin ulcer [id number] Chronic systolic (congestive) heart failure I69.30 Unspecified sequelae of cerebral infarction N18.2 Chronic kidney disease, stage 2 (mild) Facility Procedures : CPT4 Code: [id number] Description: [id number] - DEBRIDE WOUND 1ST 20 SQ CM OR < ICD-10 Diagnosis Description L97.822 Non-pressure chronic ulcer of other part of left lower leg with fat layer expose Modifier: d Quantity: 1 Physician Procedures : CPT4 Code Description Modifier [id number] - WC PHYS LEVEL 3 - EST PT 25 ICD-10 Diagnosis Description L97.822 Non-pressure chronic ulcer of other part of left lower leg with fat layer exposed I87.2 Venous insufficiency (chronic) (peripheral) E11.622  Type 2 diabetes mellitus with other skin ulcer [id number] Chronic systolic (congestive) heart failure Quantity: 1 : [id number] - WC PHYS DEBR WO ANESTH 20 SQ CM ICD-10 Diagnosis Description L97.822 Non-pressure chronic ulcer of other part of left lower leg with fat layer exposed Quantity: 1 Electronic Signature(s) Signed: 01/25/2023 12:32:22 PM By: Fredirick Maudlin MD FACS Entered By: Fredirick Maudlin on 01/25/2023 12:32:22

## 2023-02-01 ENCOUNTER — Encounter (HOSPITAL_BASED_OUTPATIENT_CLINIC_OR_DEPARTMENT_OTHER): Payer: Medicare Other | Admitting: Internal Medicine

## 2023-02-01 DIAGNOSIS — I872 Venous insufficiency (chronic) (peripheral): Secondary | ICD-10-CM | POA: Diagnosis not present

## 2023-02-01 DIAGNOSIS — E11622 Type 2 diabetes mellitus with other skin ulcer: Secondary | ICD-10-CM | POA: Diagnosis not present

## 2023-02-01 DIAGNOSIS — E1122 Type 2 diabetes mellitus with diabetic chronic kidney disease: Secondary | ICD-10-CM | POA: Diagnosis not present

## 2023-02-01 DIAGNOSIS — N182 Chronic kidney disease, stage 2 (mild): Secondary | ICD-10-CM | POA: Diagnosis not present

## 2023-02-01 DIAGNOSIS — L97822 Non-pressure chronic ulcer of other part of left lower leg with fat layer exposed: Secondary | ICD-10-CM | POA: Diagnosis not present

## 2023-02-01 DIAGNOSIS — I6930 Unspecified sequelae of cerebral infarction: Secondary | ICD-10-CM | POA: Diagnosis not present

## 2023-02-01 DIAGNOSIS — I5022 Chronic systolic (congestive) heart failure: Secondary | ICD-10-CM | POA: Diagnosis not present

## 2023-02-01 DIAGNOSIS — I130 Hypertensive heart and chronic kidney disease with heart failure and stage 1 through stage 4 chronic kidney disease, or unspecified chronic kidney disease: Secondary | ICD-10-CM | POA: Diagnosis not present

## 2023-02-07 ENCOUNTER — Telehealth: Payer: Self-pay

## 2023-02-07 NOTE — Progress Notes (Signed)
Date: 02/07/23                  Patient Name: Earl [name]            DOB: 07/03/1941 Review office visits, consults, Medication changes and Chronic conditions for Initial CCM visit with CPP on 02/14/23   PCP- 01/25/23 Dr. Melford Aase (PCP) - BP 112/60 HR 63. Start: Fluconazole 150mg  1 tab twice weekly as needed. Change: Clotrimazole-Betamethasone 1-0.05% apply to affected area 2-4 times daily  12/21/22 Dr. Melford Aase (PCP) - BP 100/56 HR 69. no med changes  12/14/22 Earl Grippe, NP (PCP) - BP 102/54 HR 75, Start: Clotrimazole-Betamethasone 1-0.05% apply to affected area 2 times daily. Terbinafine 250mg  1 tab daily for 14 days. Torsemide 20mg  - 1 tab twice daily. Stop: Furosemide 80mg , Gabapentin 300mg   09/12/22 Dr. Melford Aase (PCP) - BP 106/60 HR 66. no med changes   Specialist- 01/25/23 Dr. Celine Ahr (General Surgery) wound care. no med changes  01/18/23 Dr. Celine Ahr (General Surgery) wound care. no med changes   01/09/23 Dr. Celine Ahr (General Surgery) wound care. no med changes  12/29/22 Dr. Havery Moros (GI) - BP 102/60 HR 68. no med changes  12/14/22 Dr. Posey Pronto (Neuro) - BP 102/65 HR 67. Start: Baclofen 10mg  0.5 tab at bedtime x1 week, then increase to 0.5 tabs twice daily  11/02/22 Dr. Elisha Ponder (Podiatry) - BP 107/53 HR 60. no med changes  08/17/22 Dr. Posey Pronto (Neuro) - BP 106/60 HR 60. no med changes   Hospital visits in the last 6 months? None   Chart prep complete, will call pt 1-2 days prior to visit to confirm/complete pre-call questions.  Time Spent: 40 min HC Shon Hough, [place] [phone]     3/21 9:58AM Pre Call Questions: Date: 02/09/23 Time: AM Outcome: Successful Confirmed appointment date/time with patient/caregiver? Yes Date/time of the appointment: 02/14/23 @ 2PM Visit type: Phone Patient instructed to bring medications to appointment: Yes What, if any, problems do you have getting your medications from the pharmacy? [facility]  What is your top health concern to discuss at your upcoming  visit? Barriers with [name], daughter states they have been working with Cardiologist.      [facility] on pts daughter [name]) mobile [phone] Time spent: 80min  1020AM Pts daughter called back and confirmed initial CCM phone visit with CPP on 02/14/23 at [facility] to have meds available at time of call.  Time Spent: 9min

## 2023-02-08 ENCOUNTER — Encounter (HOSPITAL_BASED_OUTPATIENT_CLINIC_OR_DEPARTMENT_OTHER): Payer: Medicare Other | Admitting: General Surgery

## 2023-02-08 DIAGNOSIS — N182 Chronic kidney disease, stage 2 (mild): Secondary | ICD-10-CM | POA: Diagnosis not present

## 2023-02-08 DIAGNOSIS — I5022 Chronic systolic (congestive) heart failure: Secondary | ICD-10-CM | POA: Diagnosis not present

## 2023-02-08 DIAGNOSIS — I130 Hypertensive heart and chronic kidney disease with heart failure and stage 1 through stage 4 chronic kidney disease, or unspecified chronic kidney disease: Secondary | ICD-10-CM | POA: Diagnosis not present

## 2023-02-08 DIAGNOSIS — I872 Venous insufficiency (chronic) (peripheral): Secondary | ICD-10-CM | POA: Diagnosis not present

## 2023-02-08 DIAGNOSIS — L97822 Non-pressure chronic ulcer of other part of left lower leg with fat layer exposed: Secondary | ICD-10-CM | POA: Diagnosis not present

## 2023-02-08 DIAGNOSIS — E1122 Type 2 diabetes mellitus with diabetic chronic kidney disease: Secondary | ICD-10-CM | POA: Diagnosis not present

## 2023-02-08 DIAGNOSIS — E11622 Type 2 diabetes mellitus with other skin ulcer: Secondary | ICD-10-CM | POA: Diagnosis not present

## 2023-02-08 DIAGNOSIS — I6930 Unspecified sequelae of cerebral infarction: Secondary | ICD-10-CM | POA: Diagnosis not present

## 2023-02-08 DIAGNOSIS — L97322 Non-pressure chronic ulcer of left ankle with fat layer exposed: Secondary | ICD-10-CM | POA: Diagnosis not present

## 2023-02-09 NOTE — Progress Notes (Signed)
ISADORE, LABAT ([id number]) [id number] Page 1 of 8 Visit Report for 02/08/2023 Chief Complaint Document Details Patient Name: Date of Service: TO Earl [name] Earl [name] 02/08/2023 4:15 PM Medical Record Number: [id number] Patient Account Number: [id number] Date of Birth/Sex: Treating RN: 04-01-1941 (82 y.o. M) Primary Care Provider: Kirtland [name] Other Clinician: Referring Provider: Treating Provider/Extender: Earl [name] in Treatment: 4 Information Obtained from: Patient Chief Complaint Patient presents for treatment of an open ulcer due to venous insufficiency Electronic Signature(s) Signed: 02/08/2023 4:32:38 PM By: Earl Maudlin MD FACS Entered By: Earl [name] on 02/08/2023 16:32:38 -------------------------------------------------------------------------------- Debridement Details Patient Name: Date of Service: TO Earl [name], NA Earl [name] 02/08/2023 4:15 PM Medical Record Number: [id number] Patient Account Number: [id number] Date of Birth/Sex: Treating RN: [birth year] (82 y.o. Earl [name] Primary Care Provider: Kirtland [name] Other Clinician: Referring Provider: Treating Provider/Extender: Earl [name] in Treatment: 4 Debridement Performed for Assessment: Wound #4 Left,Medial,Anterior Ankle Performed By: Physician Earl Maudlin, MD Debridement Type: Debridement Severity of Tissue Pre Debridement: Fat layer exposed Level of Consciousness (Pre-procedure): Awake and Alert Pre-procedure Verification/Time Out Yes - 16:13 Taken: Start Time: 16:13 Pain Control: Lidocaine 4% T opical Solution T Area Debrided (L x W): otal 0.4 (cm) x 0.4 (cm) = 0.16 (cm) Tissue and other material debrided: Non-Viable, Slough, Slough Level: Non-Viable Tissue Debridement Description: Selective/Open Wound Instrument: Curette Bleeding: Minimum Hemostasis Achieved: Pressure Response to  Treatment: Procedure was tolerated well Level of Consciousness (Post- Awake and Alert procedure): Post Debridement Measurements of Total Wound Length: (cm) 0.4 Width: (cm) 0.4 Depth: (cm) 0.1 Volume: (cm) 0.013 Character of Wound/Ulcer Post Debridement: Improved Severity of Tissue Post Debridement: Fat layer exposed Post Procedure Diagnosis Same as Pre-procedure Notes scribed for Dr. Celine [name] by Earl Peals, RN Electronic Signature(s) Signed: 02/08/2023 4:37:09 PM By: Earl [name] Signed: 02/08/2023 4:40:54 PM By: Earl Maudlin MD FACS Entered By: Earl [name] on 02/08/2023 16:15:16 [name] ([id number]) [id number] Page 2 of 8 -------------------------------------------------------------------------------- HPI Details Patient Name: Date of Service: TO Earl [name] Earl [name] 02/08/2023 4:15 PM Medical Record Number: [id number] Patient Account Number: [id number] Date of Birth/Sex: Treating RN: 09-27-1941 (82 y.o. 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 Near the beginning of the year, he had increased swelling in his legs and developed an ulcer on his right calf. Apparently this has been treated with Betadine sugar dressings per his PCPs office. His right leg has multiple superficial open ulcers that all look like they started with blisters. There is slough accumulation in the posterior calf ulcer. He has a palpable pedal pulse. The skin of both legs is tight and shiny. 01/18/2023: The silver alginate that we applied last week has completely stuck down to all of the open wound sites. Edema  control is improved. 01/25/2023: All of the wounds on his right leg have healed. Unfortunately, a small wound has opened on his left medial lower leg, just medial and proximal to the ankle. It is small and circular and just penetrates the fat layer. 02/08/2023: The wound is about the same size. There is some slough on the surface. Electronic Signature(s) Signed: 02/08/2023 4:33:56 PM By: Earl Maudlin MD FACS Entered By: Earl [name]  on 02/08/2023 16:33:56 -------------------------------------------------------------------------------- Physical Exam Details Patient Name: Date of Service: TO Earl [name] Earl [name] 02/08/2023 4:15 PM Medical Record Number: [id number] Patient Account Number: [id number] Date of Birth/Sex: Treating RN: 08/12/1941 (82 y.o. M) Primary Care Provider: Kirtland [name] Other Clinician: Referring Provider: Treating Provider/Extender: Earl [name] in Treatment: 4 Constitutional . . . . no acute distress. Respiratory Normal work of breathing on room air. Notes 02/08/2023: The wound is about the same size. There is some slough on the surface. Electronic Signature(s) Signed: 02/08/2023 4:34:30 PM By: Earl Maudlin MD FACS Entered By: Earl [name] on 02/08/2023 16:34:29 -------------------------------------------------------------------------------- Physician Orders Details Patient Name: Date of Service: TO Earl [name], NA Earl [name] 02/08/2023 4:15 PM Medical Record Number: [id number] Patient Account Number: [id number] Date of Birth/Sex: Treating RN: [birth year] (82 y.o. Earl [name] Primary Care Provider: Kirtland [name] Other Clinician: Referring Provider: Treating Provider/Extender: Earl [name] in Treatment: 4 Verbal / Phone Orders: No Diagnosis Coding Earl [name], Earl [name] ([id number]) [id number] Page 3 of 8 ICD-10 Coding Code Description [id number] Non-pressure  chronic ulcer of other part of left lower leg with fat layer exposed I87.2 Venous insufficiency (chronic) (peripheral) E11.622 Type 2 diabetes mellitus with other skin ulcer [id number] Chronic systolic (congestive) heart failure I69.30 Unspecified sequelae of cerebral infarction N18.2 Chronic kidney disease, stage 2 (mild) Follow-up Appointments ppointment in 1 week. - Dr. Celine [name] Return A Anesthetic (In clinic) Topical Lidocaine 4% applied to wound bed Bathing/ Shower/ Hygiene May shower with protection but do not get wound dressing(s) wet. Protect dressing(s) with water repellant cover (for example, large plastic bag) or a cast cover and may then take shower. - May purchase a Cast protector from [facility] or [facility]. Please keep dressing clean and dry at all times Edema Control - Lymphedema / SCD / Other Right Lower Extremity Segmental Compressive Device. Use the Segmental Compressive Device on leg(s) 2-3 times a day for 45 - 60 minutes. If wearing any wraps or hose, do not remove them. Continue exercising as instructed. Elevate legs to the level of the heart or above for 30 minutes daily and/or when sitting for 3-4 times a day throughout the day. Patient to wear own compression stockings every day. - On left leg Compression stocking or Garment 20-30 mm/Hg pressure to: - on Left leg Wound Treatment Wound #4 - Ankle Wound Laterality: Left, Medial, Anterior Cleanser: Soap and Water Discharge Instructions: May shower and wash wound with dial antibacterial soap and water prior to dressing change. Cleanser: Wound Cleanser Discharge Instructions: Cleanse the wound with wound cleanser prior to applying a clean dressing using gauze sponges, not tissue or cotton balls. Peri-Wound Care: Sween Lotion (Moisturizing lotion) Discharge Instructions: Apply moisturizing lotion as directed Prim Dressing: Maxorb Extra Calcium Alginate, 2x2 (in/in) ary Discharge Instructions: Apply to wound bed as  instructed Secondary Dressing: ABD Pad, 8x10 Discharge Instructions: Apply over primary dressing as directed. Compression Wrap: ThreePress (3 layer compression wrap) Discharge Instructions: Apply three layer compression as directed. Patient Medications llergies: penicillin A Notifications Medication Indication Start End 02/08/2023 lidocaine DOSE topical 4 % cream - cream topical Electronic Signature(s) Signed: 02/08/2023 4:40:54 PM By: Earl Maudlin MD FACS Entered By: Earl [name] on 02/08/2023 16:34:45 -------------------------------------------------------------------------------- Problem List Details Patient Name: Date of Service: TO Earl [name], NA Earl [name] 02/08/2023 4:15 PM Medical Record Number: [id number] Patient Account Number: [id number] Date of Birth/Sex: Treating RN: [birth year] (82 y.o. M) Primary Care Provider: Kirtland [name] Other Clinician: Referring  Provider: Treating Provider/Extender: Earl [name] in Treatment: 4 Earl [name], Earl [name] ([id number]) [id number] Page 4 of 8 Active Problems ICD-10 Encounter Code Description Active Date MDM Diagnosis L97.822 Non-pressure chronic ulcer of other part of left lower leg with fat layer exposed3/04/2023 No Yes I87.2 Venous insufficiency (chronic) (peripheral) 01/09/2023 No Yes E11.622 Type 2 diabetes mellitus with other skin ulcer 01/09/2023 No Yes [id number] Chronic systolic (congestive) heart failure 01/09/2023 No Yes I69.30 Unspecified sequelae of cerebral infarction 01/09/2023 No Yes N18.2 Chronic kidney disease, stage 2 (mild) 01/09/2023 No Yes Inactive Problems Resolved Problems ICD-10 Code Description Active Date Resolved Date L97.211 Non-pressure chronic ulcer of right calf limited to breakdown of skin 01/09/2023 01/09/2023 L97.811 Non-pressure chronic ulcer of other part of right lower leg limited to breakdown of skin 01/09/2023 01/09/2023 Electronic  Signature(s) Signed: 02/08/2023 4:27:38 PM By: Earl Maudlin MD FACS Entered By: Earl [name] on 02/08/2023 16:27:38 -------------------------------------------------------------------------------- Progress Note Details Patient Name: Date of Service: TO Earl [name], NA Earl [name] 02/08/2023 4:15 PM Medical Record Number: [id number] Patient Account Number: [id number] Date of Birth/Sex: Treating RN: Aug 18, 1941 (82 y.o. 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 Near the beginning of the year, he had increased swelling in his legs and developed an ulcer on his right calf. Apparently this has been treated with Betadine sugar dressings per his PCPs office. His right leg has multiple superficial open ulcers that all look like they started with blisters. There is slough accumulation in the posterior calf ulcer. He has a palpable pedal pulse. The skin of both legs is tight and shiny. Earl [name], Earl [name] ([id number]) [id number] Page 5 of 8 01/18/2023: The silver alginate that we applied last week has completely stuck down to all of the open wound sites. Edema control is improved. 01/25/2023: All of the wounds on his right leg have healed. Unfortunately, a small wound has opened on his left medial lower leg, just medial and proximal to the ankle. It is small and circular and just penetrates the fat layer. 02/08/2023: The wound is about the same size. There is some slough on the surface. Patient History Social  History Never smoker, Marital Status - Widowed, Alcohol Use - Never, Drug Use - No History, Caffeine Use - Never. Medical History Eyes Denies history of Cataracts, Glaucoma, Optic Neuritis Ear/Nose/Mouth/Throat Denies history of Chronic sinus problems/congestion, Middle ear problems Cardiovascular Patient has history of Arrhythmia, Congestive Heart Failure, Hypertension Endocrine Patient has history of Type II Diabetes Genitourinary Denies history of End Stage Renal Disease Neurologic Denies history of Neuropathy Oncologic Denies history of Received Chemotherapy, Received Radiation Psychiatric Denies history of Anorexia/bulimia, Confinement Anxiety Objective Constitutional no acute distress. Vitals Time Taken: 4:05 PM, Height: 71 in, Weight: 151 lbs, BMI: 21.1, Temperature: 97.8 F, Pulse: 63 bpm, Respiratory Rate: 16 breaths/min, Blood Pressure: 122/70 mmHg. Respiratory Normal work of breathing on room air. General Notes: 02/08/2023: The wound is about the same size. There is some slough on the surface. Integumentary (Hair, Skin) Wound #4 status is Open. Original cause of wound was Blister. The date acquired was: 01/25/2023. The wound has been in treatment 2 [name]. The  wound is located on the Left,Medial,Anterior Ankle. The wound measures 0.4cm length x 0.4cm width x 0.1cm depth; 0.126cm^2 area and 0.013cm^3 volume. There is Fat Layer (Subcutaneous Tissue) exposed. There is no tunneling or undermining noted. There is a medium amount of serosanguineous drainage noted. The wound margin is distinct with the outline attached to the wound base. There is medium (34-66%) red, pink granulation within the wound bed. There is a medium (34-66%) amount of necrotic tissue within the wound bed including Adherent Slough. The periwound skin appearance did not exhibit: Callus, Crepitus, Excoriation, Induration, Rash, Scarring, Dry/Scaly, Maceration, Atrophie Blanche, Cyanosis, Ecchymosis, Hemosiderin  Staining, Mottled, Pallor, Rubor, Erythema. Assessment Active Problems ICD-10 Non-pressure chronic ulcer of other part of left lower leg with fat layer exposed Venous insufficiency (chronic) (peripheral) Type 2 diabetes mellitus with other skin ulcer Chronic systolic (congestive) heart failure Unspecified sequelae of cerebral infarction Chronic kidney disease, stage 2 (mild) Procedures Wound #4 Pre-procedure diagnosis of Wound #4 is a Diabetic Wound/Ulcer of the Lower Extremity located on the Left,Medial,Anterior Ankle .Severity of Tissue Pre Earl [name], Earl [name] ([id number]) [id number] Page 6 of 8 Debridement is: Fat layer exposed. There was a Selective/Open Wound Non-Viable Tissue Debridement with a total area of 0.16 sq cm performed by Earl Maudlin, MD. With the following instrument(s): Curette to remove Non-Viable tissue/material. Material removed includes [facility] after achieving pain control using Lidocaine 4% T opical Solution. No specimens were taken. A time out was conducted at 16:13, prior to the start of the procedure. A Minimum amount of bleeding was controlled with Pressure. The procedure was tolerated well. Post Debridement Measurements: 0.4cm length x 0.4cm width x 0.1cm depth; 0.013cm^3 volume. Character of Wound/Ulcer Post Debridement is improved. Severity of Tissue Post Debridement is: Fat layer exposed. Post procedure Diagnosis Wound #4: Same as Pre-Procedure General Notes: scribed for Dr. Celine [name] by Earl Peals, RN. Plan Follow-up Appointments: Return Appointment in 1 week. - Dr. Celine [name] Anesthetic: (In clinic) Topical Lidocaine 4% applied to wound bed Bathing/ Shower/ Hygiene: May shower with protection but do not get wound dressing(s) wet. Protect dressing(s) with water repellant cover (for example, large plastic bag) or a cast cover and may then take shower. - May purchase a Cast protector from [facility] or [facility]. Please keep  dressing clean and dry at all times Edema Control - Lymphedema / SCD / Other: Segmental Compressive Device. Use the Segmental Compressive Device on leg(s) 2-3 times a day for 45 - 60 minutes. If wearing any wraps or hose, do not remove them. Continue exercising as instructed. Elevate legs to the level of the heart or above for 30 minutes daily and/or when sitting for 3-4 times a day throughout the day. Patient to wear own compression stockings every day. - On left leg Compression stocking or Garment 20-30 mm/Hg pressure to: - on Left leg The following medication(s) was prescribed: lidocaine topical 4 % cream cream topical was prescribed at facility WOUND #4: - Ankle Wound Laterality: Left, Medial, Anterior Cleanser: Soap and Water Discharge Instructions: May shower and wash wound with dial antibacterial soap and water prior to dressing change. Cleanser: Wound Cleanser Discharge Instructions: Cleanse the wound with wound cleanser prior to applying a clean dressing using gauze sponges, not tissue or cotton balls. Peri-Wound Care: Sween Lotion (Moisturizing lotion) Discharge Instructions: Apply moisturizing lotion as directed Prim Dressing: Maxorb Extra Calcium Alginate, 2x2 (in/in) ary Discharge Instructions: Apply to wound bed as instructed Secondary Dressing: ABD Pad, 8x10 Discharge Instructions: Apply over primary dressing as directed. Com pression  Wrap: ThreePress (3 layer compression wrap) Discharge Instructions: Apply three layer compression as directed. 02/08/2023: The wound is about the same size. There is some slough on the surface. I used a curette to debride the slough from the wound surface. We will continue silver alginate with 3 layer compression. He complained that his right leg was swollen, but he is not wearing his compression stocking and he admits to not elevating his legs. He was reminded of the importance of doing both of these things. Follow-up in 1 week. Electronic  Signature(s) Signed: 02/08/2023 4:35:25 PM By: Earl Maudlin MD FACS Entered By: Earl [name] on 02/08/2023 16:35:25 -------------------------------------------------------------------------------- HxROS Details Patient Name: Date of Service: TO Earl [name], NA Earl [name] 02/08/2023 4:15 PM Medical Record Number: [id number] Patient Account Number: [id number] Date of Birth/Sex: Treating RN: [birth year] (82 y.o. M) Primary Care Provider: Kirtland [name] Other Clinician: Referring Provider: Treating Provider/Extender: Earl [name] in Treatment: 4 Eyes Medical History: Negative for: Cataracts; Glaucoma; Optic Neuritis Ear/Nose/Mouth/Throat Medical History: Negative for: Chronic sinus problems/congestion; Middle ear problems Cardiovascular AKAASH, PETTERSEN ([id number]) [id number] Page 7 of 8 Medical History: Positive for: Arrhythmia; Congestive Heart Failure; Hypertension Endocrine Medical History: Positive for: Type II Diabetes Treated with: Oral agents Blood sugar tested every day: Yes Tested : Genitourinary Medical History: Negative for: End Stage Renal Disease Neurologic Medical History: Negative for: Neuropathy Oncologic Medical History: Negative for: Received Chemotherapy; Received Radiation Psychiatric Medical History: Negative for: Anorexia/bulimia; Confinement Anxiety Immunizations Pneumococcal Vaccine: Received Pneumococcal Vaccination: Yes Received Pneumococcal Vaccination On or After 60th Birthday: Yes Implantable Devices None Family and Social History Never smoker; Marital Status - Widowed; Alcohol Use: Never; Drug Use: No History; Caffeine Use: Never; Financial Concerns: No; Food, Clothing or Shelter Needs: No; Support System Lacking: No; Transportation Concerns: No Electronic Signature(s) Signed: 02/08/2023 4:40:54 PM By: Earl Maudlin MD FACS Entered By: Earl [name] on 02/08/2023  16:34:03 -------------------------------------------------------------------------------- SuperBill Details Patient Name: Date of Service: TO Earl [name], NA Earl [name] 02/08/2023 Medical Record Number: [id number] Patient Account Number: [id number] Date of Birth/Sex: Treating RN: 09-04-1941 (82 y.o. M) Primary Care Provider: Kirtland [name] Other Clinician: Referring Provider: Treating Provider/Extender: Earl [name] in Treatment: 4 Diagnosis Coding ICD-10 Codes Code Description [id number] Non-pressure chronic ulcer of other part of left lower leg with fat layer exposed I87.2 Venous insufficiency (chronic) (peripheral) E11.622 Type 2 diabetes mellitus with other skin ulcer [id number] Chronic systolic (congestive) heart failure I69.30 Unspecified sequelae of cerebral infarction N18.2 Chronic kidney disease, stage 2 (mild) Facility Procedures : [place], 76 CPT4 Code: Martice [id number] [place] L Description: [id number]) [id number] - DEBRIDE WOUND 1ST 20 SQ CM OR < D-10 Diagnosis Description 97.822 Non-pressure chronic ulcer of other part of left lower leg with fat layer expose Modifier: [id number] 1 d Quantity: 1227.pdf Page 8 of 8 Physician Procedures : CPT4 Code Description Modifier [id number] - WC PHYS LEVEL 3 - EST PT 25 ICD-10 Diagnosis Description L97.822 Non-pressure chronic ulcer of other part of left lower leg with fat layer exposed I87.2 Venous insufficiency (chronic) (peripheral) XX123456  Chronic systolic (congestive) heart failure E11.622 Type 2 diabetes mellitus with other skin ulcer Quantity: 1 : [id number] - WC PHYS DEBR WO ANESTH 20 SQ CM ICD-10 Diagnosis Description L97.822 Non-pressure chronic ulcer of other part of left lower leg with fat layer exposed Quantity: 1 Electronic Signature(s) Signed: 02/08/2023 4:40:36 PM By: Earl Maudlin MD FACS Entered By: Earl [name] on 02/08/2023 16:40:35

## 2023-02-09 NOTE — Progress Notes (Signed)
[name], [name] [id number] Page 1 of 7 Visit Report for 02/08/2023 Arrival Information Details Patient Name: Date of Service: TO Earl [name] [name] 02/08/2023 4:15 PM Medical Record Number: [id number] Patient Account Number: [id number] Date of Birth/Sex: Treating RN: September 24, 1941 (82 y.o. Earl [name], Meta.Reding Primary Care Lenord Fralix: Kirtland Bouchard Other Clinician: Referring Chirsty Armistead: Treating Eduin Friedel/Extender: Collie Siad in Treatment: 4 Visit Information History Since Last Visit Added or deleted any medications: No Patient Arrived: Earl [name] Any new allergies or adverse reactions: No Arrival Time: 16:02 Had a fall or experienced change in No Accompanied By: daughter activities of daily living that may affect Transfer Assistance: None risk of falls: Patient Identification Verified: Yes Signs or symptoms of abuse/neglect since last visito No Secondary Verification Process Completed: Yes Hospitalized since last visit: No Patient Requires Transmission-Based Precautions: No Implantable device outside of the clinic excluding No Patient Has Alerts: Yes cellular tissue based products placed in the center Patient Alerts: Patient on Blood Thinner since last visit: Has Dressing in Place as Prescribed: Yes Has Compression in Place as Prescribed: Yes Pain Present Now: No Electronic Signature(s) Signed: 02/08/2023 5:04:23 PM By: Deon Pilling RN, BSN Entered By: Deon Pilling on 02/08/2023 16:04:36 -------------------------------------------------------------------------------- Encounter Discharge Information Details Patient Name: Date of Service: TO Earl [name], NA [name] 02/08/2023 4:15 PM Medical Record Number: [id number] Patient Account Number: [id number] Date of Birth/Sex: Treating RN: 07-29-1941 (82 y.o. Earl [name] Primary Care Claramae Rigdon: Kirtland Bouchard Other Clinician: Referring Yeshua Stryker: Treating  Efraim Vanallen/Extender: Collie Siad in Treatment: 4 Encounter Discharge Information Items Post Procedure Vitals Discharge Condition: Stable Temperature (F): 97.8 Ambulatory Status: Walker Pulse (bpm): 63 Discharge Destination: Home Respiratory Rate (breaths/min): 16 Transportation: Private Auto Blood Pressure (mmHg): 122/70 Accompanied By: daughter Schedule Follow-up Appointment: Yes Clinical Summary of Care: Patient Declined Electronic Signature(s) Signed: 02/08/2023 4:37:09 PM By: Adline Peals Entered By: Adline Peals on 02/08/2023 16:26:24 -------------------------------------------------------------------------------- Lower Extremity Assessment Details Patient Name: Date of Service: TO Earl [name], NA [name] 02/08/2023 4:15 PM Medical Record Number: [id number] Patient Account Number: [id number] Date of Birth/Sex: Treating RN: 12-01-1940 (82 y.o. Earl [name] Primary Care Loring Liskey: Kirtland Bouchard Other Clinician: Referring Hendrixx Severin: Treating Yoana Staib/Extender: Collie Siad in Treatment: 4 Edema Assessment T[LeftKarie Mainland [id number] Patrice [id number].pdf Page 2 of 7] Assessed: [Left: No] [Right: No] [Left: Edema] [Right: :] Calf Left: Right: Point of Measurement: From Medial Instep 37 cm 34 cm Ankle Left: Right: Point of Measurement: From Medial Instep 23.2 cm 21.8 cm Vascular Assessment Pulses: Dorsalis Pedis Palpable: [Left:Yes] Electronic Signature(s) Signed: 02/08/2023 5:04:23 PM By: Deon Pilling RN, BSN Entered By: Deon Pilling on 02/08/2023 16:07:26 -------------------------------------------------------------------------------- Multi Wound Chart Details Patient Name: Date of Service: TO Earl [name], NA [name] 02/08/2023 4:15 PM Medical Record Number: [id number] Patient Account Number: [id number] Date of Birth/Sex: Treating RN: [birth year] (82 y.o. 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/A N/A Primary Etiology: Extremity Arrhythmia, Congestive Heart Failure, N/A N/A Comorbid History: Hypertension, Type II Diabetes 01/25/2023 N/A N/A Date Acquired: 2 N/A N/A Weeks of Treatment: Open N/A N/A Wound Status: No N/A N/A Wound Recurrence:  0.4x0.4x0.1 N/A N/A Measurements L x W x D (cm) 0.126 N/A N/A A (cm) : rea 0.013 N/A N/A Volume (cm) : 0.00% N/A N/A % Reduction in A rea: 0.00% N/A N/A % Reduction in Volume: Grade 1 N/A N/A Classification: Medium N/A N/A Exudate A mount: Serosanguineous N/A N/A Exudate Type: red, brown N/A N/A Exudate Color: Distinct, outline attached N/A N/A Wound Margin: Medium (34-66%) N/A N/A Granulation A mount: Red, Pink N/A N/A Granulation Quality: Medium (34-66%) N/A N/A Necrotic A mount: Fat Layer (Subcutaneous Tissue): Yes N/A N/A Exposed Structures: Earl [name] ([id number]) [id number] Page 3 of 7 Fascia: No Tendon: No Muscle: No Joint: No Bone: No Small (1-33%) N/A N/A Epithelialization: Debridement - Selective/Open Wound N/A N/A Debridement: Pre-procedure Verification/Time Out 16:13 N/A N/A Taken: Lidocaine 4% Topical Solution N/A N/A Pain Control: Slough N/A N/A Tissue Debrided: Non-Viable Tissue N/A N/A Level: 0.16 N/A N/A Debridement A (sq cm): rea Curette N/A N/A Instrument: Minimum N/A N/A Bleeding: Pressure N/A N/A Hemostasis A chieved: Procedure was tolerated well N/A N/A Debridement  Treatment Response: 0.4x0.4x0.1 N/A N/A Post Debridement Measurements L x W x D (cm) 0.013 N/A N/A Post Debridement Volume: (cm) Excoriation: No N/A N/A Periwound Skin Texture: Induration: No Callus: No Crepitus: No Rash: No Scarring: No Maceration: No N/A N/A Periwound Skin Moisture: Dry/Scaly: No Atrophie Blanche: No N/A N/A Periwound Skin Color: Cyanosis: No Ecchymosis: No Erythema: No Hemosiderin Staining: No Mottled: No Pallor: No Rubor: No Debridement N/A N/A Procedures Performed: Treatment Notes Wound #4 (Ankle) Wound Laterality: Left, Medial, Anterior Cleanser Soap and Water Discharge Instruction: May shower and wash wound with dial antibacterial soap and water prior to dressing change. Wound Cleanser Discharge Instruction: Cleanse the wound with wound cleanser prior to applying a clean dressing using gauze sponges, not tissue or cotton balls. Peri-Wound Care Sween Lotion (Moisturizing lotion) Discharge Instruction: Apply moisturizing lotion as directed Topical Primary Dressing Maxorb Extra Calcium Alginate, 2x2 (in/in) Discharge Instruction: Apply to wound bed as instructed Secondary Dressing ABD Pad, 8x10 Discharge Instruction: Apply over primary dressing as directed. Secured With Compression Wrap ThreePress (3 layer compression wrap) Discharge Instruction: Apply three layer compression as directed. Compression Stockings Add-Ons Electronic Signature(s) Signed: 02/08/2023 4:31:59 PM By: Fredirick Maudlin MD FACS Entered By: Fredirick Maudlin on 02/08/2023 16:31:59 Earl [name] ([id number]) [id number] Page 4 of 7 -------------------------------------------------------------------------------- Multi-Disciplinary Care Plan Details Patient Name: Date of Service: TO Earl [name] [name] 02/08/2023 4:15 PM Medical Record Number: [id number] Patient Account Number: [id number] Date of Birth/Sex: Treating RN: 02-May-1941 (82 y.o. Earl [name] Primary Care Novah Nessel: Kirtland Bouchard Other Clinician: Referring Madalyn Legner: Treating Chaunte Hornbeck/Extender: Collie Siad in Treatment: 4 Active Inactive Nutrition Nursing Diagnoses: Potential for alteratiion in Nutrition/Potential for imbalanced nutrition Goals: Patient/caregiver verbalizes understanding of need to maintain therapeutic glucose control per primary care physician Date Initiated: 01/09/2023 Target Resolution Date: 02/16/2023 Goal Status: Active Interventions: Provide education on elevated blood sugars and impact on wound healing Provide education on nutrition Treatment Activities: Dietary management education, guidance and counseling : 01/09/2023 Giving encouragement to exercise : 01/09/2023 Special diet education : 01/09/2023 Notes: Orientation to the Wound Care Program Nursing Diagnoses: Knowledge deficit related to the wound healing center program Goals: Patient/caregiver will verbalize understanding of the [facility] Date Initiated: 01/09/2023 Target Resolution Date: 01/25/2023 Goal Status: Active Interventions: Provide education on orientation to the wound center Notes: Wound/Skin Impairment Nursing Diagnoses: Knowledge deficit related to ulceration/compromised skin integrity Goals: Ulcer/skin breakdown will have  a volume reduction of 30% by week 4 Date Initiated: 01/09/2023 Target Resolution Date: 02/15/2023 Goal Status: Active Interventions: Assess ulceration(s) every visit Notes: Electronic Signature(s) Signed: 02/08/2023 4:37:09 PM By: Adline Peals Entered By: Adline Peals on 02/08/2023 16:15:24 -------------------------------------------------------------------------------- Pain Assessment Details Patient Name: Date of Service: TO Earl [name], NA [name] 02/08/2023 4:15 PM Medical Record Number: [id number] Patient Account Number: [id number] Date of Birth/Sex: Treating  RN: 1941/04/12 (82 y.o. Earl [name] Primary Care Andilynn Delavega: Kirtland Bouchard Other Clinician: Festus [name] ([id number]) [id number] Page 5 of 7 Referring Verlyn Lambert: Treating Bryauna Byrum/Extender: Collie Siad in Treatment: 4 Active Problems Location of Pain Severity and Description of Pain Patient Has Paino No Site Locations Pain Management and Medication Current Pain Management: Electronic Signature(s) Signed: 02/08/2023 5:04:23 PM By: Deon Pilling RN, BSN Entered By: Deon Pilling on 02/08/2023 16:05:00 -------------------------------------------------------------------------------- Patient/Caregiver Education Details Patient Name: Date of Service: TO Earl [name] [name] 3/20/2024andnbsp4:15 PM Medical Record Number: [id number] Patient Account Number: [id number] Date of Birth/Gender: Treating RN: Oct 21, 1941 (82 y.o. Earl [name] Primary Care Physician: Kirtland Bouchard Other Clinician: Referring Physician: Treating Physician/Extender: Collie Siad in Treatment: 4 Education Assessment Education Provided To: Patient Education Topics Provided Wound/Skin Impairment: Methods: Explain/Verbal Responses: Reinforcements needed, State content correctly Electronic Signature(s) Signed: 02/08/2023 4:37:09 PM By: Adline Peals Entered By: Adline Peals on 02/08/2023 16:15:35 -------------------------------------------------------------------------------- Wound Assessment Details Patient Name: Date of Service: TO Earl [name] [name] 02/08/2023 4:15 PM Medical Record Number: [id number] Patient Account Number: [id number] Date of Birth/Sex: Treating RN: 1941-03-02 (82 y.o. Earl [name] Primary Care Evett Kassa: Kirtland Bouchard Other Clinician: Referring Jarel Cuadra: Treating Espiridion Supinski/Extender: Collie Siad in Treatment: 4 [place], Earl [name]  ([id number]) [id number] Page 6 of 7 Wound Status Wound Number: 4 Primary Diabetic Wound/Ulcer of the Lower Extremity Etiology: Wound Location: Left, Medial, Anterior Ankle Wound Status: Open Wounding Event: Blister Comorbid Arrhythmia, Congestive Heart Failure, Hypertension, Type II Date Acquired: 01/25/2023 History: Diabetes Weeks Of Treatment: 2 Clustered Wound: No Photos Wound Measurements Length: (cm) 0.4 Width: (cm) 0.4 Depth: (cm) 0.1 Area: (cm) 0.126 Volume: (cm) 0.013 % Reduction in Area: 0% % Reduction in Volume: 0% Epithelialization: Small (1-33%) Tunneling: No Undermining: No Wound Description Classification: Grade 1 Wound Margin: Distinct, outline attached Exudate Amount: Medium Exudate Type: Serosanguineous Exudate Color: red, brown Foul Odor After Cleansing: No Slough/Fibrino Yes Wound Bed Granulation Amount: Medium (34-66%) Exposed Structure Granulation Quality: Red, Pink Fascia Exposed: No Necrotic Amount: Medium (34-66%) Fat Layer (Subcutaneous Tissue) Exposed: Yes Necrotic Quality: Adherent Slough Tendon Exposed: No Muscle Exposed: No Joint Exposed: No Bone Exposed: No Periwound Skin Texture Texture Color No Abnormalities Noted: No No Abnormalities Noted: No Callus: No Atrophie Blanche: No Crepitus: No Cyanosis: No Excoriation: No Ecchymosis: No Induration: No Erythema: No Rash: No Hemosiderin Staining: No Scarring: No Mottled: No Pallor: No Moisture Rubor: No No Abnormalities Noted: No Dry / Scaly: No Maceration: No Treatment Notes Wound #4 (Ankle) Wound Laterality: Left, Medial, Anterior Cleanser Soap and Water Discharge Instruction: May shower and wash wound with dial antibacterial soap and water prior to dressing change. Wound Cleanser Discharge Instruction: Cleanse the wound with wound cleanser prior to applying a clean dressing using gauze sponges, not tissue or cotton balls. Peri-Wound  Care Sween Lotion (Moisturizing lotion) Discharge Instruction: Apply moisturizing lotion as directed Earl, [name] ([id number]) [phone].pdf Page 7 of 7 Topical Primary Dressing Maxorb Extra Calcium Alginate, 2x2 (in/in) Discharge Instruction: Apply to wound  bed as instructed Secondary Dressing ABD Pad, 8x10 Discharge Instruction: Apply over primary dressing as directed. Secured With Compression Wrap ThreePress (3 layer compression wrap) Discharge Instruction: Apply three layer compression as directed. Compression Stockings Add-Ons Electronic Signature(s) Signed: 02/08/2023 4:37:09 PM By: Adline Peals Signed: 02/08/2023 5:04:23 PM By: Deon Pilling RN, BSN Entered By: Adline Peals on 02/08/2023 16:10:11 -------------------------------------------------------------------------------- Vitals Details Patient Name: Date of Service: TO Earl [name], NA [name] 02/08/2023 4:15 PM Medical Record Number: [id number] Patient Account Number: [id number] Date of Birth/Sex: Treating RN: 1941-11-14 (82 y.o. Earl [name], Meta.Reding Primary Care Jamyson Jirak: Kirtland Bouchard Other Clinician: Referring Devyon Keator: Treating Ardys Hataway/Extender: Collie Siad in Treatment: 4 Vital Signs Time Taken: 16:05 Temperature (F): 97.8 Height (in): 71 Pulse (bpm): 63 Weight (lbs): 151 Respiratory Rate (breaths/min): 16 Body Mass Index (BMI): 21.1 Blood Pressure (mmHg): 122/70 Reference Range: 80 - 120 mg / dl Electronic Signature(s) Signed: 02/08/2023 5:04:23 PM By: Deon Pilling RN, BSN Entered By: Deon Pilling on 02/08/2023 16:05:51

## 2023-02-14 ENCOUNTER — Ambulatory Visit: Payer: Medicare Other | Admitting: Pharmacist

## 2023-02-14 DIAGNOSIS — I480 Paroxysmal atrial fibrillation: Secondary | ICD-10-CM

## 2023-02-14 DIAGNOSIS — Z7901 Long term (current) use of anticoagulants: Secondary | ICD-10-CM

## 2023-02-14 DIAGNOSIS — Z79899 Other long term (current) drug therapy: Secondary | ICD-10-CM

## 2023-02-14 DIAGNOSIS — E1169 Type 2 diabetes mellitus with other specified complication: Secondary | ICD-10-CM

## 2023-02-14 DIAGNOSIS — I10 Essential (primary) hypertension: Secondary | ICD-10-CM

## 2023-02-14 DIAGNOSIS — I5022 Chronic systolic (congestive) heart failure: Secondary | ICD-10-CM

## 2023-02-14 DIAGNOSIS — E1122 Type 2 diabetes mellitus with diabetic chronic kidney disease: Secondary | ICD-10-CM

## 2023-02-14 NOTE — Progress Notes (Signed)
Initial Pharmacist Visit (CCM)  Earl [name],Earl [name]  82 years, Male  DOB: 26-Feb-1941  M: ([id number]  ____________________________________________ Summary for PCP:  1. DME- requesting new BP monitor and scale. Dtr to contact CP with insurance information on benefits- will send message with requested pharmacy. 2. Reminded pt/dtr on CHF weight benchmarks (3/5lbs gains).  Visit Details Patient scheduled for CCM visit with the clinical pharmacist.  Patient is referred for CCM by their PCP and Clinical Pharmacist is under general PCP supervision.: At least 2 of these conditions are expected to last 12 months or longer and patient is at significant risk for acute exacerbations and/or functional decline.  Patient has consented to participation in [place] program. Visit Type: Phone Call Date of Upcoming Visit: 02/14/2023  Engagement Notes Earl [name] on 02/09/2023 10:25 AM pts daughter Earl [name]) mobile [phone] Earl [name] on 01/16/2023 11:11 AM Message: "[facility]-CCM INITIAL TELEVISIT SCH/W DAUGHTER Earl Wahlen WILL ASSIST WITH APT CALL MOBILE#"  Patient Chart Prep (HC) Chronic Conditions Patient's Chronic Conditions: Atrial Fibrillation, Hypertension (HTN), Heart Failure, Diabetes (DM), Hyperlipidemia/Dyslipidemia (HLD), Hypothyroidism, Chronic Kidney Disease (CKD), Benign Prostatic Hyperplasia (BPH), Anemia, Other, Cardiovascular Disease (CVD) List Other Conditions (separated by comma): Vitamin D deficiency  Doctor and Hospital Visits Were there PCP Visits in last 6 months?: Yes PCP Visits details: 01/25/23 Dr. Melford Aase (PCP) - BP 112/60 HR 63. Start: Fluconazole 150mg  1 tab twice weekly as needed. Change: Clotrimazole-Betamethasone 1-0.05% apply to affected area 2-4 times daily 12/21/22 Dr. Melford Aase (PCP) - BP 100/56 HR 69. no med changes 12/14/22 Carlye Grippe, NP (PCP) - BP 102/54 HR 75, Start: Clotrimazole-Betamethasone 1-0.05% apply to affected area 2 times daily. Terbinafine 250mg   1 tab daily for 14 days. Torsemide 20mg  - 1 tab twice daily. Stop: Furosemide 80mg , Gabapentin 300mg  09/12/22 Dr. Melford Aase (PCP) - BP 106/60 HR 66. no med changes Were there Specialist Visits in last 6 months?: Yes Specialist Visits details: 01/25/23 Dr. Celine Ahr (General Surgery) wound care. no med changes 01/18/23 Dr. Celine Ahr (General Surgery) wound care. no med changes 01/09/23 Dr. Celine Ahr (General Surgery) wound care. no med changes  12/29/22 Dr. Havery Moros (GI) - BP 102/60 HR 68. no med changes 12/14/22 Dr. Posey Pronto (Neuro) - BP 102/65 HR 67. Start: Baclofen 10mg  0.5 tab at bedtime x1 week, then increase to 0.5 tabs twice daily 11/02/22 Dr. Elisha Ponder (Podiatry) - BP 107/53 HR 60. no med changes 08/17/22 Dr. Posey Pronto (Neuro) - BP 106/60 HR 60. no med changes Was there a Hospital Visit in last 30 days?: No Were there other Hospital Visits in last 6 months?: No Engagement Notes Earl [name] on 02/09/2023 10:25 AM pts daughter Earl [name]) mobile [phone]  Disease Assessments Visit Date Visit Completed on: 02/14/2023 Subjective Information Did patient bring medications to appointment?: No What source (s) was used to reconcile medications this visit?: EMR list, Patient/caregiver list Subjective: Completed telemedicine visit with patient and his daughter. Mr. Druckman lives at home and has home health services most days of the week for assistance. Earl (dtr) stays with at all times and also her brother will assist as well. He is independent of ADLs and uses a walker for stability and assistance. Lifestyle habits: Diet: low salt/sodium Exercise: limited mobility, just completed a round of PT Tobacco:  Alcohol: Caffeine:  Recreational drugs:  SDOH: Accountable Health Communities Health-Related Social Needs Screening Tool (BloggerBowl.es) SDOH questions completed during initial visit?: Yes What is your living situation today? (ref #1): I  have a steady place to live Think about the place  you live. Do you have problems with any of the following? (ref #2): None of the above Within the past 12 months, you worried that your food would run out before you got money to buy more (ref #3): Never true Within the past 12 months, the food you bought just didn't last and you didn't have money to get more (ref #4): Never true In the past 12 months, has lack of reliable transportation kept you from medical appointments, meetings, work or from getting things needed for daily living? (ref #5): No In the past 12 months, has the electric, gas, oil, or water company threatened to shut off services in your home? (ref #6): No How often does anyone, including family and friends, physically hurt you? (ref #7): Never (1) How often does anyone, including family and friends, insult or talk down to you? (ref #8): Never (1) How often does anyone, including friends and family, threaten you with harm? (ref #9): Never (1) How often does anyone, including family and friends, scream or curse at you? (ref #10): Never (1)  Medication Adherence Does the [facility] have access to medication refill history?: No Factors that may affect medication adherence?: Pill burden, Complexity of med regimen Name and location of Current pharmacy: [place] Current Rx insurance plan: UHC Are meds synced by current pharmacy?: No Are meds delivered by current pharmacy?: No - delivery available but patient prefers to not use Would patient benefit from direct intervention of clinical lead in dispensing process to optimize clinical outcomes?: No Are [facility] pharmacy services available where patient lives?: Yes Is patient disadvantaged to use [facility]?: Yes Does patient experience delays in picking up medications due to transportation concerns (getting to pharmacy)?: No Medication organization: pill box- dtr organizes Additional Info: [name]- having issues with grant, heart  failure clinic aware Assessment:: Adherent  Hypertension (HTN) Most Recent BP: 112/60 Most Recent HR: 63 taken on: 01/25/2023 Care Gap: Need BP documented or last BP 140/90 or higher: Addressed Assessed today?: No  Hyperlipidemia/Dyslipidemia (HLD) Last Lipid panel on: 12/14/2022 TC (Goal<200): 118 LDL: 49 HDL (Goal>40): 56 TG (Goal<150): 58 ASCVD 10-year risk?is:: N/A due to existing ASCVD Assessed today?: No Drug: Atorvastatin 40mg  - 1 tab once daily Pharmacist Assessment: Appropriate, Effective, Safe, Accessible  Diabetes (DM) Most recent A1C: 6.3 % taken on: 12/14/2022 Previous A1C: 6.3 % taken on: 08/22/2022 Most Recent GFR: 65 taken on: 12/14/2022 Type: 2 Most recent microalbumin ratio: 6 tested on: 09/12/2022 Care Gap: Statin therapy needed: Addressed Care Gap: Need A1c documented or last A1c > 9 %: Addressed Care Gap: Need eye exam documented in EMR or by claim: Needs to be addressed Care Gap: Need eGFR and uACR for kidney health evaluation: Needs to be addressed Assessed today?: Yes Goal A1C: < 7.0 % Type: 2 Is Patient taking statin medication?: Yes Is patient taking ACEi / ARB?: Yes Blood glucose monitoring: Glucometer How often does patient check Blood Sugars?: 4 days per week Has patient experienced any hypoglycemic episodes?: No We discussed: How to recognize and treat signs of hypoglycemia., Low carbohydrate eating plan with an emphasis on whole grains, legumes, nuts, fruits, and vegetables and minimal refined and processed foods., Complications of Type 2 DM and preventative measures with patient for 5-10 minutes, Avoiding both sugar-sweetened and artificially (aspartame) sweetened beverages and increase water intake., Fasting and post-prandial blood glucose goals, Scheduling a yearly eye exam, Scheduling a dental appointment twice yearly Assessment:: Controlled Drug: Glipizide 5mg  - 0.5 tab twice daily Pharmacist Assessment: Appropriate,  Effective, Safe,  Accessible Drug: Metformin 500mg  - 2 tabs twice daily  Pharmacist Assessment: Appropriate, Effective, Safe, Accessible Plan/Follow up: Goal:  A1c < 7% Modify:  No changes to medication regimen at this time Order:  CMP, A1c at next office visit Counsel:  Monitor for s/sx hypoglycemia with fall risk and Eliquis; continue to monitor BG and keep log. Call PCP or CCM team for BG levels < 70.  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 in one day or more than 5 lbs. in a week?: No We discussed: Sodium restriction, Daily weight monitoring, Heart failure pathophysiology, treatment goals, management, identifying signs of acute exacerbations and exacerbation prevention methods with patient for 10-15 minutes., Limiting fluid to recommended fluid restriction Assessment:: Controlled Drug: Spironolactone 25mg  - 1 tablet daily Pharmacist Assessment: Appropriate, Effective, Safe, Accessible Drug: Entresto 24-26mg  - 1 tab twice daily Pharmacist Assessment: Appropriate, Effective, Safe, Accessible Drug: Torsemide 20mg  - 1 tab twice daily Pharmacist Assessment: Appropriate, Effective, Safe, Accessible Drug: Metoprolol succinate ER 50mg  - 1 tablet at bedtime Pharmacist Assessment: Appropriate, Effective, Safe, Accessible Plan/Follow up: Goal:  Patient will have decreased heart failure symptoms and exacerbations. Modify:  No changes to pharmacotherapy at this time Order:  Echo, ECG, CMP, Lipids, Counsel: Counseled pt and dtr on importance of daily weights and keep log. Instructed pt to call PCP, Cardiologist, or CCM team for  weight gain > 3lbs/24 hrs or 5lbs/week. Monitor for SOB, chest pain, changes in exertion, or dyspnea.  BPH Most Recent PSA: 0.18 taken on: 09/12/2022 Assessed today?: No Drug: Finasteride 5mg  - 1 tab once daily Pharmacist Assessment: Appropriate, Effective, Safe, Accessible Atrial Fibrillation Most recent INR: 1.0 taken on: 02/21/2020 Previous INR: 1.1 taken on: 01/16/2020 Assessed today?: Yes CHADS2-VASc: Congestive Heart Failure (1), Hypertension (1), Age 20 Years or Older (2), Diabetes Mellitus (1), Stroke, Transient Ischemic Attack, or Thromboembolism (2) CHADS2-VASc Score: 7 Type of AF: Paroxysmal Type of control: Rate controlled We discussed: Signs and symptoms of bleeding associated with anticoagulant use, Importance of adherence with anticoagulant therapy and regular monitoring, Risks of atrial fibrillation (CVA, MI, heart failure) Assessment:: Controlled Drug: Eliquis 5mg  - 1 tab twice daily Pharmacist Assessment: Appropriate, Effective, Safe, Accessible Drug: Metoprolol Succinate 50mg  - 1 tab once daily Pharmacist Assessment: Appropriate, Effective, Safe, Accessible Plan/Follow up: Goal:  Decreased afib symptoms Modify: no changes to pharmacotherapy at this time. Order: CBC, CMP, EKG at next office visit. Counsel: Monitor HR/BP, shortness of breath, fluttering, changes in exertion, chest pain- call PCP/Cardiologist or CCM team for any of these symptoms.  Chronic kidney disease (CKD) Most Recent GFR: 65 taken on: 12/14/2022 Previous GFR: 69 taken on: 08/22/2022 Most recent microalbumin ratio: 6 tested on: 09/12/2022 Assessed today?: No  Cardiovascular Disease (CVD) Care Gap: Moderate / high intensity statin therapy needed: Addressed Assessed today?: No  Hypothyroidism Most recent TSH: 1.00 taken on: 12/14/2022 Previous TSH: 0.83 taken on: 09/12/2022 Most recent T4: no value found Most recent T3: no value found Assessed today?: No Drug: Levothyroxine 63mcg -  1 tab once daily on an empty stomach  Pharmacist Assessment: Appropriate, Effective, Safe, Accessible  General Assessed today?: No  Preventative Health Care  Gap: Colorectal cancer screening: Addressed Care Gap: Breast cancer screening: Patient excluded from population (Age > 56, hx of bilateral mastectomy, frailty, hospice services) Care Gap: Annual Wellness Visit (AWV): Needs to be addressed Additional diet counseling points. We discussed: aiming to consume at least 8 cups of water day, key components of a plant-based diet, key components of a low-carb eating plan, key components of the DASH diet  Engagement Notes Earl [name] on 02/09/2023 10:25 AM pts daughter Virat Distad) mobile [phone]  Clinical Summary  Summary Next Pharmacist Follow Up: 4 months Next AWV: 2025  Recommended CRN Follow-Up in: Not indicated at this time Physician Referral Statement:: I reaffirm my previous referral of patient for CCM services Attestation Statement:: CCM Services:  This encounter meets complex CCM services and moderate to high medical decision making.  Prior to outreach and patient consent for Chronic Care Management, I referred this patient for services after reviewing the nominated patient list or from a personal encounter with the patient.  I have personally reviewed this encounter including the documentation in this note and have collaborated with the care management provider regarding care management and care coordination activities to include development and update of the comprehensive care plan I am certifying that I agree with the content of this note and encounter as supervising physician. Pharmacist Interventions Intervention Details Pharmacist Interventions discussed: Yes Started Therapy: Preventative therapy Monitoring: Preventative health screenings, Routine monitoring Education: Medication administration Follow Ups CRN Follow Up and Interim Scheduling CRN visit  Scheduled and protocol assigned: N/A Care Plan created and initiated: Done  CP [place]  81 years, Male  DOB: 12-Feb-1941  M: [id number]  __________________________________________________ General Information Details Chronic Conditions: Anemia, Benign Prostatic Hyperplasia (BPH), Chronic Kidney Disease (CKD), Cardiovascular Disease (CVD), Diabetes (DM), Heart Failure (HF), Hyperlipidemia/Dyslipidemia (HLD), Hypertension (HTN), Hypothyroidism, Atrial Fibrillation Contact your clinical pharmacist and care team with any questions or concerns. Team Phone #:: [phone] Clinical Pharmacist:: [place] Registered Nurse: [place]:: [place] High Blood Pressure GOAL: Maintain Blood Pressure less than: 130/80 Your most recent BP is: 112/60 taken on: 01/25/2023 Patient education:: Recommend following the DASH diet, which emphasizes fruits and vegetables and low-fat dairy products along with whole grains, fish, poultry, and nuts. Reduce red meats and sugars., Recommend limiting the daily amount of salt intake to less than 1500mg /per day or 2/3 teaspoonful a day, Recommend using a salt substitute to replace table salt if extra seasoning/flavor is needed., Educated patient to call PCP office if you develop episodes of low blood pressure, dizziness, falls or increased headaches, and/or swelling of legs and feet. Discussed checking and recording home blood pressure readings: Daily Your current blood pressure medications are::  Metoprolol Succinate 50g - 1 tab once daily Spironolactone 25mg  - 1 tab once daily  High Cholesterol GOAL:: Maintain triglycerides less than 150, Maintain LDL (bad) cholesterol less than 70 Your most recent LDL level is: 49 Your most recent triglycerides level is: 58 taken on: 12/14/2022 Patient education:: Discussed how a diet high in fruits/vegetables/nuts/whole grains/beans may help to reduce  your cholesterol. Increasing soluble fiber intake (whole grains, fruits, vegetables, beans, nuts and seeds). Recommended avoiding sugary foods and trans fat, limiting carbohydrates, and reducing portion sizes. Recommended Patient to incorporate more healthy fats (salmon, cold-water fish, almonds, walnuts) into their diet. Your current cholesterol medications are::  Atorvastatin 40mg  - 1 tab once daily  Diabetes (DM) GOAL: Maintain an A1c (long-term blood sugar control) less than:  7% Your most recent A1C is: 6.3 % taken on: 12/14/2022 Patient education:: Instructed patient to check feet daily, and to call with any signs of infection such as redness, fever, swelling, pain, rise of skin temperature, open wounds, drainage, or unusual odor. Instructed patient to wear cushioned closed toe shoes that fit well with thick, padded socks; avoid going barefoot.  Reviewed proper care of nails and calluses., Recommend avoiding both table sugar (ex: regular sodas, juice and sweet tea) and artificially (aspartame, saccharin, sucralose, etc.) sweetened beverages (ex: diet sodas) and increasing water intake., Education provided on importance of regular mealtimes, carbohydrates goals for each meal and snacks, food label reading, portion sizes, and increasing intake of dietary fiber (whole grains, fruits, vegetables, beans, nuts and seeds)., Discussed with patient to watch for signs of low blood sugar (shaking, sweating, fast heart rate, hunger, headache, confusion, drowsiness, or irritability). A blood sugar less than 70 is considered dangerously low. Eat or drink something with high sugar (4 oz regular soda, 1 tablespoon honey or sugar, hard candy not sugar free, glucose tablets or gel. Avoid high fat foods when blood sugar is low (peanut butter is not recommended). Wait 15 minutes and check blood sugar. If still below 70, repeat. If above 70, eat a balanced snack or meal. Notify your care team if you are having more than one  low blood sugar episode per week., Discussed a goal for fasting blood sugar readings to be between 80 and 130, Recommend scheduling a yearly eye exam., Recommend seeing the dentist twice yearly. Your current diabetes medications are::  Glipizide 5mg  - 0.5 tab twice daily Metformin 500mg  - 2 tabs twice daily  Heart Failure (HF) GOAL: Prevent: Worsening symptoms of fluid overload, Hospitalizations Patient education:: Recommend weighing yourself daily, using a scale on a hard flat surface, before breakfast after using the restroom and report any signs of fluid overload to your pharmacist, nurse, or primary care provider; Call if you have a weight gain of more than three pounds in a day or five pounds in a week, increased cough, swelling (feet, ankles, legs, stomach), shortness of breath, needing more pillows to sleep or needing to sit in a chair to sleep, Educated Patient to avoid adding salt to food and maintain low sodium diet with goal of less than 1500 mg per day (2/3 of a teaspoonful). Your current heart failure medications are::  Eliquis 5mg  - 1 tab twice daily Entresto 24-26mg  - 1 tab twice daily Torsemide 20mg  - 1 tab twice daily Spironolactone 25mg - 1 tablet daily  Enlarged Prostate GOAL:: Reduce urinary symptoms Patient education:: Educated patient to avoid over the counter decongestants (phenylephrine and pseudoephedrine) and antihistamines since these can worsen symptoms of enlarged prostate., Educated patient to monitor for increased dizziness and lowered blood pressure, especially when changing positions, when taking treatments for enlarged prostate., Educated Patient to limit fluids for 2-3 hours prior to bedtime, and avoid caffeine or alcohol in the afternoons Your current prostate medications are::  Finasteride 5mg  - 1 tab once daily  Atrial Fibrillation GOAL:: Maintain controlled heart rhythm and rate and prevent stroke / clots Patient education:: Educated Patient on signs and  symptoms of bleeding and to contact pharmacist / nurse / PCP with any questions or concerns. This includes a nosebleed that won't stop within 10 minutes, cuts / scrapes that won't stop bleeding, red or cola colored urine, red or dark-colored stool. Your current atrial fibrillation medications are::  Eliquis 5mg  - 1 tab twice daily Metoprolol Succinate  50mg  - 1 tab once daily  Chronic kidney disease (CKD) GOAL:: Slow progression of kidney disease and maintain blood pressure less than 130/80 Patient education:: Educated Patient on importance of blood pressure and blood sugar control to help stop kidney disease from worsening., Recommend avoiding medications that can harm the kidneys, such as a group of medications called NSAIDs, which include ibuprofen (Advil/Motrin) and naproxen (Aleve)., Recommend limiting dietary sodium intake  to less than 1 teaspoonful or 2000 mg per day. Cardiovascular Disease (CVD) GOAL:: Slow progression of atherosclerosis (plaques / blockages) throughout your body to reduce risk of heart attack and strokes Patient education:: Discussed  the importance of their prescribed statin therapy. Though you cannot feel the effects of the statin, they work to reduce cholesterol and reduce the risk of future heart attack and stroke., Recommend contacting 911 if you have sudden one-sided weakness in the face, arm or leg; trouble speaking or understanding someone else talking, and/or facial droop., Recommend contacting 911 if you have sudden severe chest pain and/or tightness, left arm pain and/or shortness of breath., Discussed with patient importance of healthy lifestyle behaviors, maintain healthy weight, limiting alcohol intake, low-fat and low-sodium diet, avoiding tobacco use, managing stress, and physical activity, Discussed importance of maintaining good cholesterol levels, targeting a bad cholesterol level, or LDL, of less than 70, Discussed importance of blood pressure control, with a  goal BP of less than 130/80 Hypothyroidism GOAL:: Maintain TSH levels within target range of 0.450 and 4.500 Patient education:: Recommend taking levothyroxine first thing in the morning before eating any food or drinking anything other than water. Take with a full glass of water and wait at least 30 minutes before eating for the day. Separate from vitamins by at least 4 hours. Your current thyroid medications are::  Levothyroxine 20mcg - 1 tab once daily on an empty stomach  Anemia GOAL: Maintain hemoglobin (blood count) levels between: 13.2 and 16.6 (men) Patient education:: For iron deficiency anemia, recommend eating foods rich in iron, which includes dark leafy green vegetables, red meat, fortified bread and cereals, beans, and peas., Educated patient to report signs of unusual bruising or bleeding to their primary care provider or clinical pharmacist., Discussed with patient reporting issues with fatigue and ability to perform activities of daily living and the demands of daily living Your current Anemia medications are::  Ferrous Sulfate 325 (65FE) - 1 tab once daily  Other Needs Recommended Preventative Health Care:: Recommend scheduling Annual Wellness Visit, Recommend yearly flu vaccine  CPP Chart Prep 15 min  CPP Telemedicine Visit 35 min  CPP Office Visit Documentation 30 min  CPP Initial Care Plan 5 min

## 2023-02-15 ENCOUNTER — Other Ambulatory Visit (HOSPITAL_COMMUNITY): Payer: Self-pay

## 2023-02-15 ENCOUNTER — Encounter (HOSPITAL_BASED_OUTPATIENT_CLINIC_OR_DEPARTMENT_OTHER): Payer: Medicare Other | Admitting: General Surgery

## 2023-02-15 DIAGNOSIS — I5022 Chronic systolic (congestive) heart failure: Secondary | ICD-10-CM | POA: Diagnosis not present

## 2023-02-15 DIAGNOSIS — I872 Venous insufficiency (chronic) (peripheral): Secondary | ICD-10-CM | POA: Diagnosis not present

## 2023-02-15 DIAGNOSIS — N182 Chronic kidney disease, stage 2 (mild): Secondary | ICD-10-CM | POA: Diagnosis not present

## 2023-02-15 DIAGNOSIS — L97322 Non-pressure chronic ulcer of left ankle with fat layer exposed: Secondary | ICD-10-CM | POA: Diagnosis not present

## 2023-02-15 DIAGNOSIS — E11622 Type 2 diabetes mellitus with other skin ulcer: Secondary | ICD-10-CM | POA: Diagnosis not present

## 2023-02-15 DIAGNOSIS — I130 Hypertensive heart and chronic kidney disease with heart failure and stage 1 through stage 4 chronic kidney disease, or unspecified chronic kidney disease: Secondary | ICD-10-CM | POA: Diagnosis not present

## 2023-02-15 DIAGNOSIS — L97822 Non-pressure chronic ulcer of other part of left lower leg with fat layer exposed: Secondary | ICD-10-CM | POA: Diagnosis not present

## 2023-02-15 DIAGNOSIS — I6930 Unspecified sequelae of cerebral infarction: Secondary | ICD-10-CM | POA: Diagnosis not present

## 2023-02-15 DIAGNOSIS — E1122 Type 2 diabetes mellitus with diabetic chronic kidney disease: Secondary | ICD-10-CM | POA: Diagnosis not present

## 2023-02-16 NOTE — Progress Notes (Signed)
Earl [name], Earl [name] ([id number]) [id number] Page 1 of 8 Visit Report for 02/15/2023 Arrival Information Details Patient Name: Date of Service: TO Earl [name], [place] [name] 02/15/2023 2:00 PM Medical Record Number: [id number] Patient Account Number: [id number] Date of Birth/Sex: Treating RN: 1941/11/01 (82 y.o. M) Primary Care Earl [name]: Earl [name] Other Clinician: Referring Earl [name]: Treating Earl [name]/Earl: Earl [name] in Treatment: 5 Visit Information History Since Last Visit All ordered tests and consults were completed: No Patient Arrived: Earl [name] Added or deleted any medications: No Arrival Time: 14:22 Any new allergies or adverse reactions: No Accompanied By: daughter Had a fall or experienced change in Yes Transfer Assistance: None activities of daily living that may affect Patient Identification Verified: Yes risk of falls: Secondary Verification Process Completed: Yes Signs or symptoms of abuse/neglect since last visito No Patient Requires Transmission-Based Precautions: No Hospitalized since last visit: No Patient Has Alerts: Yes Implantable device outside of the clinic excluding No Patient Alerts: Patient on Blood Thinner cellular tissue based products placed in the center since last visit: Has Dressing in Place as Prescribed: Yes Has Compression in Place as Prescribed: Yes Pain Present Now: No Electronic Signature(s) Signed: 02/15/2023 5:17:40 PM By: Earl Gouty RN, BSN Entered By: Earl [name] on 02/15/2023 14:28:39 -------------------------------------------------------------------------------- Compression Therapy Details Patient Name: Date of Service: TO Earl [name], NA [name] 02/15/2023 2:00 PM Medical Record Number: [id number] Patient Account Number: [id number] Date of Birth/Sex: Treating RN: [birth year] (82 y.o. Earl [name] Primary Care Earl [name]: Earl [name] Other Clinician: Referring  Earl [name]: Treating Earl [name]/Earl: Earl [name] in Treatment: 5 Compression Therapy Performed for Wound Assessment: Wound #4 Left,Medial,Anterior Ankle Performed By: Clinician Earl Gouty, RN Compression Type: Double Layer Post Procedure Diagnosis Same as Pre-procedure Electronic Signature(s) Signed: 02/15/2023 5:17:40 PM By: Earl Gouty RN, BSN Entered By: Earl [name] on 02/15/2023 14:45:25 -------------------------------------------------------------------------------- Encounter Discharge Information Details Patient Name: Date of Service: TO Earl [name], NA [name] 02/15/2023 2:00 PM Medical Record Number: [id number] Patient Account Number: [id number] Date of Birth/Sex: Treating RN: [birth year] (82 y.o. Earl [name] Primary Care Earl [name]: Earl [name] Other Clinician: Referring Earl [name]: Treating Earl [name]/Earl: Earl [name] in Treatment: 5 Encounter Discharge Information Items Post Procedure Vitals Discharge Condition: Stable Temperature (F): 98.3 Ambulatory Status: Walker Pulse (bpm): 60 Discharge Destination: Home Respiratory Rate (breaths/min): 18 [name], [name] [phone].pdf Page 2 of 8 Transportation: Private Auto Blood Pressure (mmHg): 119/68 Accompanied By: daughter Schedule Follow-up Appointment: Yes Clinical Summary of Care: Patient Declined Electronic Signature(s) Signed: 02/15/2023 5:17:40 PM By: Earl Gouty RN, BSN Entered By: Earl [name] on 02/15/2023 15:04:32 -------------------------------------------------------------------------------- Lower Extremity Assessment Details Patient Name: Date of Service: TO Earl [name], NA [name] 02/15/2023 2:00 PM Medical Record Number: [id number] Patient Account Number: [id number] Date of Birth/Sex: Treating RN: 22-May-1941 (82 y.o. Earl [name] Primary Care Earl [name]: Earl [name] Other  Clinician: Referring Earl [name]: Treating Earl [name]/Earl: Earl [name] in Treatment: 5 Edema Assessment Assessed: [Left: No] [Right: No] Edema: [Left: Ye] [Right: s] Calf Left: Right: Point of Measurement: From Medial Instep 33 cm Ankle Left: Right: Point of Measurement: From Medial Instep 24.4 cm Vascular Assessment Pulses: Dorsalis Pedis Palpable: [Left:Yes] Electronic Signature(s) Signed: 02/15/2023 5:17:40 PM By: Earl Gouty RN, BSN Entered By: Earl [name] on 02/15/2023 14:33:50 -------------------------------------------------------------------------------- Multi Wound Chart Details Patient Name: Date of Service: TO Earl [name], NA [name] 02/15/2023 2:00 PM Medical Record Number: [id number] Patient Account Number: [id number] Date of Birth/Sex:  Treating RN: 05/21/1941 (82 y.o. 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 Primary Etiology: Extremity Arrhythmia, Congestive Heart Failure, N/A N/A Comorbid History: Hypertension, Type II Diabetes 01/25/2023 N/A N/A Date Acquired: 3 N/A N/A [name] of Treatment: Open N/A N/A Wound Status: No N/A N/A Wound Recurrence: 0.4x0.4x0.1 N/A N/A Measurements L x W x D (cm) 0.126 N/A N/A A (cm) : rea 0.013 N/A N/A Volume (cm) : 0.00% N/A N/A % Reduction in A rea: 0.00% N/A N/A % Reduction in Volume: Grade 1 N/A N/A Classification: Small N/A N/A Exudate A mount: Serosanguineous N/A N/A Exudate  Type: red, brown N/A N/A Exudate Color: Distinct, outline attached N/A N/A Wound Margin: Small (1-33%) N/A N/A Granulation A mount: Red N/A N/A Granulation Quality: Large (67-100%) N/A N/A Necrotic A mount: Fat Layer (Subcutaneous Tissue): Yes N/A N/A Exposed Structures: Fascia: No Tendon: No Muscle: No Joint: No Bone: No Small (1-33%) N/A N/A Epithelialization: Debridement - Selective/Open Wound N/A N/A Debridement: Pre-procedure Verification/Time Out 14:45 N/A N/A Taken: Lidocaine 4% Topical Solution N/A N/A Pain Control: Slough N/A N/A Tissue Debrided: Non-Viable Tissue N/A N/A Level: 0.16 N/A N/A Debridement A (sq cm): rea Curette N/A N/A Instrument: Minimum N/A N/A Bleeding: Pressure N/A N/A Hemostasis A chieved: 1 N/A N/A Procedural Pain: 0 N/A N/A Post Procedural Pain: Procedure was tolerated well N/A N/A Debridement Treatment Response: 0.4x0.4x0.1 N/A N/A Post Debridement Measurements L x W x D (cm) 0.013 N/A N/A Post Debridement Volume: (cm) Excoriation: No N/A N/A Periwound Skin Texture: Induration: No Callus: No Crepitus: No Rash: No Scarring: No Maceration: No N/A N/A Periwound Skin Moisture: Dry/Scaly: No Hemosiderin Staining: Yes N/A N/A Periwound Skin Color: Atrophie Blanche: No Cyanosis: No Ecchymosis: No Erythema: No Mottled: No Pallor: No Rubor: No No Abnormality N/A N/A Temperature: Yes N/A N/A Tenderness on Palpation: Compression Therapy N/A N/A Procedures Performed: Debridement Treatment Notes Wound #4 (Ankle) Wound Laterality: Left, Medial, Anterior Cleanser Soap and Water Discharge Instruction: May shower and wash wound with dial antibacterial soap and water prior to dressing change. Wound Cleanser Discharge Instruction: Cleanse the wound with wound cleanser prior to applying a clean dressing using gauze sponges, not tissue or cotton balls. Peri-Wound Care Sween Lotion (Moisturizing lotion) Discharge  Instruction: Apply moisturizing lotion as directed Earl [name], Earl [name] ([id number]) [id number] Page 4 of 8 Topical Mupirocin Ointment Discharge Instruction: Apply Mupirocin (Bactroban) as instructed Primary Dressing Maxorb Extra Calcium Alginate, 2x2 (in/in) Discharge Instruction: Apply to wound bed as instructed Secondary Dressing Woven Gauze Sponge, Non-Sterile 4x4 in Discharge Instruction: Apply over primary dressing as directed. Secured With Compression Wrap Urgo K2, two layer compression system, regular Discharge Instruction: Apply Urgo K2 as directed (alternative to 4 layer compression). Compression Stockings Add-Ons Electronic Signature(s) Signed: 02/15/2023 3:05:26 PM By: Fredirick Maudlin MD FACS Entered By: Fredirick Maudlin on 02/15/2023 15:05:26 -------------------------------------------------------------------------------- Multi-Disciplinary Care Plan Details Patient Name: Date of Service: TO Earl [name], NA [name] 02/15/2023 2:00 PM Medical Record Number: [id number] Patient Account Number: [id number] Date of Birth/Sex: Treating RN: January 25, 1941 (82 y.o. Earl [name] Primary Care Renne Cornick: Earl [name] Other Clinician: Referring Subhan Hoopes: Treating Izaia Say/Earl: Earl [name] in  Treatment: 5 Multidisciplinary Care Plan reviewed with physician Active Inactive Abuse / Safety / Falls / Self Care Management Nursing Diagnoses: History of Falls Potential for falls Goals: Patient/caregiver will verbalize/demonstrate measures taken to prevent injury and/or falls Date Initiated: 02/15/2023 Target Resolution Date: 03/15/2023 Goal Status: Active Interventions: Assess fall risk on admission and as needed Assess impairment of mobility on admission and as needed per policy Assess personal safety and home safety (as indicated) on admission and as needed Notes: Nutrition Nursing Diagnoses: Potential for  alteratiion in Nutrition/Potential for imbalanced nutrition Goals: Patient/caregiver verbalizes understanding of need to maintain therapeutic glucose control per primary care physician Date Initiated: 01/09/2023 Target Resolution Date: 02/16/2023 Goal Status: Active Interventions: Provide education on elevated blood sugars and impact on wound healing Provide education on nutrition Earl [name], Earl [name] ([id number]) [phone].pdf Page 5 of 8 Treatment Activities: Dietary management education, guidance and counseling : 01/09/2023 Giving encouragement to exercise : 01/09/2023 Special diet education : 01/09/2023 Notes: Electronic Signature(s) Signed: 02/15/2023 5:17:40 PM By: Earl Gouty RN, BSN Entered By: Earl [name] on 02/15/2023 14:41:04 -------------------------------------------------------------------------------- Pain Assessment Details Patient Name: Date of Service: TO Earl [name], NA [name] 02/15/2023 2:00 PM Medical Record Number: [id number] Patient Account Number: [id number] Date of Birth/Sex: Treating RN: Jul 10, 1941 (82 y.o. M) Primary Care Chabeli Barsamian: Earl [name] Other Clinician: Referring Sharise Lippy: Treating [name]/Earl: Earl [name] in Treatment: 5 Active Problems Location of Pain Severity and Description of Pain Patient Has Paino No Site Locations Rate the pain. Current Pain Level: 0 Pain Management and Medication Current Pain Management: Electronic Signature(s) Signed: 02/15/2023 5:17:40 PM By: Earl Gouty RN, BSN Entered By: Earl [name] on 02/15/2023 14:29:25 -------------------------------------------------------------------------------- Patient/Caregiver Education Details Patient Name: Date of Service: TO Earl [name], NA [name] 3/27/2024andnbsp2:00 PM Medical Record Number: [id number] Patient Account Number: [id number] Date of Birth/Gender: Treating RN: [birth year] (82 y.o. Earl [name] Primary Care Physician: Earl [name] Other Clinician: Referring Physician: Treating Physician/Earl: Earl [name] in Treatment: 5 Education Assessment Education Provided To: Patient Earl [name], Earl [name] ([id number]) [id number] Page 6 of 8 Education Topics Provided Venous: Methods: Explain/Verbal Responses: Reinforcements needed, State content correctly Electronic Signature(s) Signed: 02/15/2023 5:17:40 PM By: Earl Gouty RN, BSN Entered By: Earl [name] on 02/15/2023 14:40:04 -------------------------------------------------------------------------------- Wound Assessment Details Patient Name: Date of Service: TO Earl [name], NA [name] 02/15/2023 2:00 PM Medical Record Number: [id number] Patient Account Number: [id number] Date of Birth/Sex: Treating RN: [birth year] (82 y.o. Earl [name] Primary Care Kishawn Pickar: Earl [name] Other Clinician: Referring Genita Nilsson: Treating Ishanvi Mcquitty/Earl: Earl [name] in Treatment: 5 Wound Status Wound Number: 4 Primary Diabetic Wound/Ulcer of the Lower Extremity Etiology: Wound Location: Left, Medial, Anterior Ankle Wound Status: Open Wounding Event: Blister Comorbid Arrhythmia, Congestive Heart Failure, Hypertension, Type II Date Acquired: 01/25/2023 History: Diabetes [name] Of Treatment: 3 Clustered Wound: No Photos Wound Measurements Length: (cm) 0.4 Width: (cm) 0.4 Depth: (cm) 0.1 Area: (cm) 0.126 Volume: (cm) 0.013 % Reduction in Area: 0% % Reduction in Volume: 0% Epithelialization: Small (1-33%) Tunneling: No Undermining: No Wound Description Classification: Grade 1 Wound Margin: Distinct, outline attached Exudate Amount: Small Exudate Type: Serosanguineous Exudate Color: red, brown Foul Odor After Cleansing: No Slough/Fibrino Yes Wound Bed Granulation Amount: Small (1-33%) Exposed Structure Granulation Quality:  Red Fascia Exposed: No Necrotic Amount: Large (67-100%) Fat Layer (Subcutaneous Tissue) Exposed: Yes Necrotic Quality: Adherent Slough Tendon Exposed: No Muscle Exposed: No Joint Exposed: No Bone Exposed: No Periwound Skin Texture Texture Color  No Abnormalities Noted: Yes No Abnormalities Noted: No Atrophie Blanche: No Moisture Cyanosis: No No Abnormalities NotedCIRILO, Earl [name] ([id number]) [id number] Page 7 of 8 Ecchymosis: No Erythema: No Hemosiderin Staining: Yes Mottled: No Pallor: No Rubor: No Temperature / Pain Temperature: No Abnormality Tenderness on Palpation: Yes Treatment Notes Wound #4 (Ankle) Wound Laterality: Left, Medial, Anterior Cleanser Soap and Water Discharge Instruction: May shower and wash wound with dial antibacterial soap and water prior to dressing change. Wound Cleanser Discharge Instruction: Cleanse the wound with wound cleanser prior to applying a clean dressing using gauze sponges, not tissue or cotton balls. Peri-Wound Care Sween Lotion (Moisturizing lotion) Discharge Instruction: Apply moisturizing lotion as directed Topical Mupirocin Ointment Discharge Instruction: Apply Mupirocin (Bactroban) as instructed Primary Dressing Maxorb Extra Calcium Alginate, 2x2 (in/in) Discharge Instruction: Apply to wound bed as instructed Secondary Dressing Woven Gauze Sponge, Non-Sterile 4x4 in Discharge Instruction: Apply over primary dressing as directed. Secured With Compression Wrap Urgo K2, two layer compression system, regular Discharge Instruction: Apply Urgo K2 as directed (alternative to 4 layer compression). Compression Stockings Add-Ons Electronic Signature(s) Signed: 02/15/2023 5:17:40 PM By: Earl Gouty RN, BSN Entered By: Earl [name] on 02/15/2023 14:36:25 -------------------------------------------------------------------------------- Vitals Details Patient Name: Date of Service: TO Earl [name], NA [name] 02/15/2023 2:00 PM Medical Record Number: [id number] Patient Account Number: [id number] Date of Birth/Sex: Treating RN: 02-Mar-1941 (82 y.o. M) Primary Care Derrius Furtick: Earl [name] Other Clinician: Referring Amay Mijangos: Treating Illianna Paschal/Earl: Earl [name] in Treatment: 5 Vital Signs Time Taken: 02:23 Temperature (F): 98.3 Height (in): 71 Pulse (bpm): 60 Weight (lbs): 151 Respiratory Rate (breaths/min): 20 Body Mass Index (BMI): 21.1 Blood Pressure (mmHg): 119/68 Capillary Blood Glucose (mg/dl): 116 Reference Range: 80 - 120 mg / dl Notes glucose per pt report yesterday Earl [name] ([id number]) [id number] Page 8 of 8 Electronic Signature(s) Signed: 02/15/2023 5:17:40 PM By: Earl Gouty RN, BSN Entered By: Earl [name] on 02/15/2023 14:29:16

## 2023-02-16 NOTE — Progress Notes (Signed)
Earl [name] ([id number]) [id number] Page 1 of 8 Visit Report for 02/15/2023 Chief Complaint Document Details Patient Name: Date of Service: TO Earl [name], [place] Earl [name] 02/15/2023 2:00 PM Medical Record Number: [id number] Patient Account Number: [id number] Date of Birth/Sex: Treating RN: Mar 25, 1941 (82 y.o. M) Primary Care Provider: Kirtland [name] Other Clinician: Referring Provider: Treating Provider/Extender: Earl [name] in Treatment: 5 Information Obtained from: Patient Chief Complaint Patient presents for treatment of an open ulcer due to venous insufficiency Electronic Signature(s) Signed: 02/15/2023 3:06:30 PM By: Fredirick Maudlin MD FACS Entered By: Fredirick Maudlin on 02/15/2023 15:06:30 -------------------------------------------------------------------------------- Debridement Details Patient Name: Date of Service: TO Earl [name], Earl [name] 02/15/2023 2:00 PM Medical Record Number: [id number] Patient Account Number: [id number] Date of Birth/Sex: Treating RN: Apr 15, 1941 (82 y.o. Earl [name] Primary Care Provider: Kirtland [name] Other Clinician: Referring Provider: Treating Provider/Extender: Earl [name] in Treatment: 5 Debridement Performed for Assessment: Wound #4 Left,Medial,Anterior Ankle Performed By: Physician Fredirick Maudlin, MD Debridement Type: Debridement Severity of Tissue Pre Debridement: Fat layer exposed Level of Consciousness (Pre-procedure): Awake and Alert Pre-procedure Verification/Time Out Yes - 14:45 Taken: Start Time: 14:45 Pain Control: Lidocaine 4% T opical Solution T Area Debrided (L x W): otal 0.4 (cm) x 0.4 (cm) = 0.16 (cm) Tissue and other material debrided: Non-Viable, Slough, Slough Level: Non-Viable Tissue Debridement Description: Selective/Open Wound Instrument: Curette Bleeding: Minimum Hemostasis Achieved: Pressure Procedural Pain:  1 Post Procedural Pain: 0 Response to Treatment: Procedure was tolerated well Level of Consciousness (Post- Awake and Alert procedure): Post Debridement Measurements of Total Wound Length: (cm) 0.4 Width: (cm) 0.4 Depth: (cm) 0.1 Volume: (cm) 0.013 Character of Wound/Ulcer Post Debridement: Improved Severity of Tissue Post Debridement: Fat layer exposed Post Procedure Diagnosis Same as Pre-procedure Notes Scribed for Dr. Celine Ahr by Baruch Gouty, RN Electronic Signature(s) Signed: 02/15/2023 4:43:02 PM By: Fredirick Maudlin MD FACS Earl [name] ([id number]) [id number] Page 2 of 8 Signed: 02/15/2023 5:17:40 PM By: Baruch Gouty RN, BSN Entered By: Baruch Gouty on 02/15/2023 14:47:37 -------------------------------------------------------------------------------- HPI Details Patient Name: Date of Service: TO Earl [name], Earl [name] 02/15/2023 2:00 PM Medical Record Number: [id number] Patient Account Number: [id number] Date of Birth/Sex: Treating RN: 09-Feb-1941 (82 y.o. 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 Near the beginning of the year, he had increased swelling in his legs and developed an ulcer on his right calf. Apparently this has been treated with Betadine sugar dressings per his PCPs office. His right leg has multiple superficial open ulcers that all look like they started with blisters. There is slough accumulation in the posterior calf ulcer. He has a palpable pedal pulse. The skin of both legs is tight and shiny. 01/18/2023: The silver alginate that we applied last week has completely stuck down to  all of the open wound sites. Edema control is improved. 01/25/2023: All of the wounds on his right leg have healed. Unfortunately, a small wound has opened on his left medial lower leg, just medial and proximal to the ankle. It is small and circular and just penetrates the fat layer. 02/08/2023: The wound is about the same size. There is some slough on the surface. 02/15/2023: The wound remains unchanged. Electronic  Signature(s) Signed: 02/15/2023 3:06:48 PM By: Fredirick Maudlin MD FACS Entered By: Fredirick Maudlin on 02/15/2023 15:06:48 -------------------------------------------------------------------------------- Physical Exam Details Patient Name: Date of Service: TO Earl [name], Earl [name] 02/15/2023 2:00 PM Medical Record Number: [id number] Patient Account Number: [id number] Date of Birth/Sex: Treating RN: [birth year] (82 y.o. M) Primary Care Provider: Kirtland [name] Other Clinician: Referring Provider: Treating Provider/Extender: Earl [name] in Treatment: 5 Constitutional . . . . no acute distress. Respiratory . Notes 02/15/2023: The wound is completely unchanged. Electronic Signature(s) Signed: 02/15/2023 3:08:37 PM By: Fredirick Maudlin MD FACS Entered By: Fredirick Maudlin on 02/15/2023 15:08:36 -------------------------------------------------------------------------------- Physician Orders Details Patient Name: Date of Service: TO Earl [name], Earl [name] 02/15/2023 2:00 PM Medical Record Number: [id number] Patient Account Number: [id number] Date of Birth/Sex: Treating RN: [birth year] (82 y.o. Earl [name] Primary Care Provider: Kirtland [name] Other Clinician: Referring Provider: Treating Provider/Extender: Earl [name], Earl [name] ([id number]) [id number] Page 3 of 8 [name] in Treatment: 5 Verbal / Phone Orders: No Diagnosis Coding ICD-10 Coding Code Description [phone] Non-pressure  chronic ulcer of other part of left lower leg with fat layer exposed I87.2 Venous insufficiency (chronic) (peripheral) E11.622 Type 2 diabetes mellitus with other skin ulcer [id number] Chronic systolic (congestive) heart failure I69.30 Unspecified sequelae of cerebral infarction N18.2 Chronic kidney disease, stage 2 (mild) Follow-up Appointments ppointment in 1 week. - Dr. Celine Ahr RM 3 Return A Wed. 4/3 @ 11:00 am Anesthetic (In clinic) Topical Lidocaine 4% applied to wound bed Bathing/ Shower/ Hygiene May shower with protection but do not get wound dressing(s) wet. Protect dressing(s) with water repellant cover (for example, large plastic bag) or a cast cover and may then take shower. - May purchase a Cast protector from [facility] or [facility]. Please keep dressing clean and dry at all times Edema Control - Lymphedema / SCD / Other Right Lower Extremity Segmental Compressive Device. Use the Segmental Compressive Device on leg(s) 2-3 times a day for 45 - 60 minutes. If wearing any wraps or hose, do not remove them. Continue exercising as instructed. Elevate legs to the level of the heart or above for 30 minutes daily and/or when sitting for 3-4 times a day throughout the day. Patient to wear own compression stockings every day. - On left leg Compression stocking or Garment 20-30 mm/Hg pressure to: - on Left leg Wound Treatment Wound #4 - Ankle Wound Laterality: Left, Medial, Anterior Cleanser: Soap and Water 1 x Per Week/30 Days Discharge Instructions: May shower and wash wound with dial antibacterial soap and water prior to dressing change. Cleanser: Wound Cleanser 1 x Per Week/30 Days Discharge Instructions: Cleanse the wound with wound cleanser prior to applying a clean dressing using gauze sponges, not tissue or cotton balls. Peri-Wound Care: Sween Lotion (Moisturizing lotion) 1 x Per Week/30 Days Discharge Instructions: Apply moisturizing lotion as directed Topical: Mupirocin Ointment 1 x  Per Week/30 Days Discharge Instructions: Apply Mupirocin (Bactroban) as instructed Prim Dressing: Maxorb Extra Calcium Alginate, 2x2 (in/in) 1 x Per Week/30 Days ary Discharge Instructions: Apply to wound bed as instructed Secondary Dressing: Woven Gauze Sponge, Non-Sterile 4x4 in 1 x Per Week/30 Days Discharge Instructions: Apply over primary dressing as directed. Compression Wrap: Urgo K2, two layer compression system, regular 1 x Per Week/30 Days Discharge Instructions: Apply Urgo K2 as directed (alternative to 4 layer compression). Electronic Signature(s) Signed: 02/15/2023 4:43:02 PM By: Fredirick Maudlin MD FACS Entered By: Fredirick Maudlin on 02/15/2023 15:08:49 -------------------------------------------------------------------------------- Problem List Details  Patient Name: Date of Service: TO Earl [name], [place] Earl [name] 02/15/2023 2:00 PM Medical Record Number: [id number] Patient Account Number: [id number] Date of Birth/Sex: Treating RN: [birth year] (82 y.o. Earl [name] Primary Care Provider: Kirtland [name] Other Clinician: Referring Provider: Treating Provider/Extender: Earl [name] in Treatment: 5 [place], Earl [name] ([id number]) [id number] Page 4 of 8 Active Problems ICD-10 Encounter Code Description Active Date MDM Diagnosis L97.822 Non-pressure chronic ulcer of other part of left lower leg with fat layer exposed3/04/2023 No Yes I87.2 Venous insufficiency (chronic) (peripheral) 01/09/2023 No Yes E11.622 Type 2 diabetes mellitus with other skin ulcer 01/09/2023 No Yes [id number] Chronic systolic (congestive) heart failure 01/09/2023 No Yes I69.30 Unspecified sequelae of cerebral infarction 01/09/2023 No Yes N18.2 Chronic kidney disease, stage 2 (mild) 01/09/2023 No Yes Inactive Problems Resolved Problems ICD-10 Code Description Active Date Resolved Date L97.211 Non-pressure chronic ulcer of right calf limited to  breakdown of skin 01/09/2023 01/09/2023 L97.811 Non-pressure chronic ulcer of other part of right lower leg limited to breakdown of skin 01/09/2023 01/09/2023 Electronic Signature(s) Signed: 02/15/2023 3:03:22 PM By: Fredirick Maudlin MD FACS Entered By: Fredirick Maudlin on 02/15/2023 15:03:22 -------------------------------------------------------------------------------- Progress Note Details Patient Name: Date of Service: TO Earl [name], Earl [name] 02/15/2023 2:00 PM Medical Record Number: [id number] Patient Account Number: [id number] Date of Birth/Sex: Treating RN: November 13, 1941 (82 y.o. 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 Near the beginning of the year, he had increased swelling in his legs and developed an ulcer on his right calf. Apparently this has been treated with Betadine sugar dressings per his PCPs office. His right leg has multiple superficial open ulcers that all look like they started with blisters. There is slough accumulation in the posterior calf ulcer. He has a palpable pedal pulse. The skin of both legs is tight and shiny. Earl [name], Earl [name] ([id number]) [id number] Page 5 of 8 01/18/2023: The silver alginate that we applied last week has completely stuck down to all of the open wound sites. Edema control is improved. 01/25/2023: All of the wounds on his right leg have healed. Unfortunately, a small wound has opened on his left medial lower leg, just medial and proximal to the ankle. It is  small and circular and just penetrates the fat layer. 02/08/2023: The wound is about the same size. There is some slough on the surface. 02/15/2023: The wound remains unchanged. Patient History Social History Never smoker, Marital Status - Widowed, Alcohol Use - Never, Drug Use - No History, Caffeine Use - Never. Medical History Eyes Denies history of Cataracts, Glaucoma, Optic Neuritis Ear/Nose/Mouth/Throat Denies history of Chronic sinus problems/congestion, Middle ear problems Cardiovascular Patient has history of Arrhythmia, Congestive Heart Failure, Hypertension Endocrine Patient has history of Type II Diabetes Genitourinary Denies history of End Stage Renal Disease Neurologic Denies history of Neuropathy Oncologic Denies history of Received Chemotherapy, Received Radiation Psychiatric Denies history of Anorexia/bulimia, Confinement Anxiety Objective Constitutional no acute distress. Vitals Time Taken: 2:23 AM, Height: 71 in, Weight: 151 lbs, BMI: 21.1, Temperature: 98.3 F, Pulse: 60 bpm, Respiratory Rate: 20 breaths/min, Blood Pressure: 119/68 mmHg, Capillary Blood Glucose: 116 mg/dl. General  Notes: glucose per pt report yesterday General Notes: 02/15/2023: The wound is completely unchanged. Integumentary (Hair, Skin) Wound #4 status is Open. Original cause of wound was Blister. The date acquired was: 01/25/2023. The wound has been in treatment 3 [name]. The wound is located on the Left,Medial,Anterior Ankle. The wound measures 0.4cm length x 0.4cm width x 0.1cm depth; 0.126cm^2 area and 0.013cm^3 volume. There is Fat Layer (Subcutaneous Tissue) exposed. There is no tunneling or undermining noted. There is a small amount of serosanguineous drainage noted. The wound margin is distinct with the outline attached to the wound base. There is small (1-33%) red granulation within the wound bed. There is a large (67-100%) amount of necrotic tissue within the wound bed including Adherent  Slough. The periwound skin appearance had no abnormalities noted for texture. The periwound skin appearance had no abnormalities noted for moisture. The periwound skin appearance exhibited: Hemosiderin Staining. The periwound skin appearance did not exhibit: Atrophie Blanche, Cyanosis, Ecchymosis, Mottled, Pallor, Rubor, Erythema. Periwound temperature was noted as No Abnormality. The periwound has tenderness on palpation. Assessment Active Problems ICD-10 Non-pressure chronic ulcer of other part of left lower leg with fat layer exposed Venous insufficiency (chronic) (peripheral) Type 2 diabetes mellitus with other skin ulcer Chronic systolic (congestive) heart failure Unspecified sequelae of cerebral infarction Chronic kidney disease, stage 2 (mild) Procedures Wound #4 Earl [name], Earl [name] ([id number]) [phone].pdf Page 6 of 8 Pre-procedure diagnosis of Wound #4 is a Diabetic Wound/Ulcer of the Lower Extremity located on the Left,Medial,Anterior Ankle .Severity of Tissue Pre Debridement is: Fat layer exposed. There was a Selective/Open Wound Non-Viable Tissue Debridement with a total area of 0.16 sq cm performed by Fredirick Maudlin, MD. With the following instrument(s): Curette to remove Non-Viable tissue/material. Material removed includes [facility] after achieving pain control using Lidocaine 4% T opical Solution. No specimens were taken. A time out was conducted at 14:45, prior to the start of the procedure. A Minimum amount of bleeding was controlled with Pressure. The procedure was tolerated well with a pain level of 1 throughout and a pain level of 0 following the procedure. Post Debridement Measurements: 0.4cm length x 0.4cm width x 0.1cm depth; 0.013cm^3 volume. Character of Wound/Ulcer Post Debridement is improved. Severity of Tissue Post Debridement is: Fat layer exposed. Post procedure Diagnosis Wound #4: Same as Pre-Procedure General Notes: Scribed for Dr.  Celine Ahr by Baruch Gouty, RN. Pre-procedure diagnosis of Wound #4 is a Diabetic Wound/Ulcer of the Lower Extremity located on the Left,Medial,Anterior Ankle . There was a Double Layer Compression Therapy Procedure by Baruch Gouty, RN. Post procedure Diagnosis Wound #4: Same as Pre-Procedure Plan Follow-up Appointments: Return Appointment in 1 week. - Dr. Celine Ahr RM 3 Wed. 4/3 @ 11:00 am Anesthetic: (In clinic) Topical Lidocaine 4% applied to wound bed Bathing/ Shower/ Hygiene: May shower with protection but do not get wound dressing(s) wet. Protect dressing(s) with water repellant cover (for example, large plastic bag) or a cast cover and may then take shower. - May purchase a Cast protector from [facility] or [facility]. Please keep dressing clean and dry at all times Edema Control - Lymphedema / SCD / Other: Segmental Compressive Device. Use the Segmental Compressive Device on leg(s) 2-3 times a day for 45 - 60 minutes. If wearing any wraps or hose, do not remove them. Continue exercising as instructed. Elevate legs to the level of the heart or above for 30 minutes daily and/or when sitting for 3-4 times a day throughout the day. Patient to wear own compression stockings every  day. - On left leg Compression stocking or Garment 20-30 mm/Hg pressure to: - on Left leg WOUND #4: - Ankle Wound Laterality: Left, Medial, Anterior Cleanser: Soap and Water 1 x Per Week/30 Days Discharge Instructions: May shower and wash wound with dial antibacterial soap and water prior to dressing change. Cleanser: Wound Cleanser 1 x Per Week/30 Days Discharge Instructions: Cleanse the wound with wound cleanser prior to applying a clean dressing using gauze sponges, not tissue or cotton balls. Peri-Wound Care: Sween Lotion (Moisturizing lotion) 1 x Per Week/30 Days Discharge Instructions: Apply moisturizing lotion as directed Topical: Mupirocin Ointment 1 x Per Week/30 Days Discharge Instructions: Apply Mupirocin  (Bactroban) as instructed Prim Dressing: Maxorb Extra Calcium Alginate, 2x2 (in/in) 1 x Per Week/30 Days ary Discharge Instructions: Apply to wound bed as instructed Secondary Dressing: Woven Gauze Sponge, Non-Sterile 4x4 in 1 x Per Week/30 Days Discharge Instructions: Apply over primary dressing as directed. Com pression Wrap: Urgo K2, two layer compression system, regular 1 x Per Week/30 Days Discharge Instructions: Apply Urgo K2 as directed (alternative to 4 layer compression). 02/15/2023: The wound is completely unchanged. I used a curette to debride slough from the wound surface. I am going to add a little bit of topical mupirocin to see if there is some wound colonization that is inhibiting his healing. Will continue silver alginate as a contact layer. I am also going to increase his degree of compression to 4-layer from 3 layer to see if this also aids in wound healing. Follow-up in 1 week. Electronic Signature(s) Signed: 02/15/2023 3:09:51 PM By: Fredirick Maudlin MD FACS Entered By: Fredirick Maudlin on 02/15/2023 15:09:51 -------------------------------------------------------------------------------- HxROS Details Patient Name: Date of Service: TO Earl [name], Earl [name] 02/15/2023 2:00 PM Medical Record Number: [id number] Patient Account Number: [id number] Date of Birth/Sex: Treating RN: [birth year] (82 y.o. M) Primary Care Provider: Kirtland [name] Other Clinician: Referring Provider: Treating Provider/Extender: Earl [name] in Treatment: 5 Eyes Medical History: Negative for: Cataracts; Glaucoma; Optic Neuritis Ear/Nose/Mouth/Throat Medical History: Negative for: Chronic sinus problems/congestion; Middle ear problems Earl [name], Earl [name] ([id number]) [id number] Page 7 of 8 Cardiovascular Medical History: Positive for: Arrhythmia; Congestive Heart Failure; Hypertension Endocrine Medical History: Positive for: Type II  Diabetes Treated with: Oral agents Blood sugar tested every day: Yes Tested : Genitourinary Medical History: Negative for: End Stage Renal Disease Neurologic Medical History: Negative for: Neuropathy Oncologic Medical History: Negative for: Received Chemotherapy; Received Radiation Psychiatric Medical History: Negative for: Anorexia/bulimia; Confinement Anxiety Immunizations Pneumococcal Vaccine: Received Pneumococcal Vaccination: Yes Received Pneumococcal Vaccination On or After 60th Birthday: Yes Implantable Devices None Family and Social History Never smoker; Marital Status - Widowed; Alcohol Use: Never; Drug Use: No History; Caffeine Use: Never; Financial Concerns: No; Food, Clothing or Shelter Needs: No; Support System Lacking: No; Transportation Concerns: No Electronic Signature(s) Signed: 02/15/2023 4:43:02 PM By: Fredirick Maudlin MD FACS Entered By: Fredirick Maudlin on 02/15/2023 15:06:55 -------------------------------------------------------------------------------- SuperBill Details Patient Name: Date of Service: TO Earl [name], Earl [name] 02/15/2023 Medical Record Number: [id number] Patient Account Number: [id number] Date of Birth/Sex: Treating RN: 12/08/1940 (82 y.o. M) Primary Care Provider: Kirtland [name] Other Clinician: Referring Provider: Treating Provider/Extender: Earl [name] in Treatment: 5 Diagnosis Coding ICD-10 Codes Code Description [phone] Non-pressure chronic ulcer of other part of left lower leg with fat layer exposed I87.2 Venous insufficiency (chronic) (peripheral) E11.622 Type 2 diabetes mellitus with other skin ulcer [id number] Chronic systolic (congestive) heart failure I69.30 Unspecified sequelae of cerebral infarction  N18.2 Chronic kidney disease, stage 2 (mild) ISAACK, PERSINGER ([id number]) [phone].pdf Page 8 of 8 Facility Procedures : CPT4 Code: [id number] Description: [phone] -  DEBRIDE WOUND 1ST 20 SQ CM OR < ICD-10 Diagnosis Description L97.822 Non-pressure chronic ulcer of other part of left lower leg with fat layer expose Modifier: d Quantity: 1 Physician Procedures : CPT4 Code Description Modifier [id number] - WC PHYS LEVEL 4 - EST PT 25 ICD-10 Diagnosis Description L97.822 Non-pressure chronic ulcer of other part of left lower leg with fat layer exposed I87.2 Venous insufficiency (chronic) (peripheral) E11.622  Type 2 diabetes mellitus with other skin ulcer [id number] Chronic systolic (congestive) heart failure Quantity: 1 : [id number] - WC PHYS DEBR WO ANESTH 20 SQ CM ICD-10 Diagnosis Description L97.822 Non-pressure chronic ulcer of other part of left lower leg with fat layer exposed Quantity: 1 Electronic Signature(s) Signed: 02/15/2023 3:10:12 PM By: Fredirick Maudlin MD FACS Entered By: Fredirick Maudlin on 02/15/2023 15:10:12

## 2023-02-17 ENCOUNTER — Other Ambulatory Visit (HOSPITAL_COMMUNITY): Payer: Self-pay

## 2023-02-19 DIAGNOSIS — E785 Hyperlipidemia, unspecified: Secondary | ICD-10-CM | POA: Diagnosis not present

## 2023-02-19 DIAGNOSIS — E1122 Type 2 diabetes mellitus with diabetic chronic kidney disease: Secondary | ICD-10-CM | POA: Diagnosis not present

## 2023-02-19 DIAGNOSIS — I480 Paroxysmal atrial fibrillation: Secondary | ICD-10-CM | POA: Diagnosis not present

## 2023-02-19 DIAGNOSIS — N182 Chronic kidney disease, stage 2 (mild): Secondary | ICD-10-CM | POA: Diagnosis not present

## 2023-02-20 ENCOUNTER — Other Ambulatory Visit: Payer: Self-pay

## 2023-02-21 ENCOUNTER — Other Ambulatory Visit (HOSPITAL_COMMUNITY): Payer: Self-pay

## 2023-02-21 ENCOUNTER — Telehealth (HOSPITAL_COMMUNITY): Payer: Self-pay

## 2023-02-21 ENCOUNTER — Encounter (HOSPITAL_COMMUNITY): Payer: Self-pay | Admitting: Cardiology

## 2023-02-21 ENCOUNTER — Encounter: Payer: Self-pay | Admitting: Neurology

## 2023-02-21 MED ORDER — ENTRESTO 24-26 MG PO TABS: 1.0000 | ORAL_TABLET | Freq: Two times a day (BID) | ORAL | 3 refills | Status: DC

## 2023-02-21 NOTE — Telephone Encounter (Signed)
His daughter reached and they still have not gotten the [place]. I asked if she had reached out to the company, but have not heard back yet. Is there any way we can follow up??

## 2023-02-22 ENCOUNTER — Encounter: Payer: Self-pay | Admitting: Nurse Practitioner

## 2023-02-22 ENCOUNTER — Encounter (HOSPITAL_BASED_OUTPATIENT_CLINIC_OR_DEPARTMENT_OTHER): Payer: Medicare Other | Attending: General Surgery | Admitting: General Surgery

## 2023-02-22 ENCOUNTER — Telehealth (HOSPITAL_COMMUNITY): Payer: Self-pay

## 2023-02-22 ENCOUNTER — Encounter: Payer: Self-pay | Admitting: Podiatry

## 2023-02-22 ENCOUNTER — Ambulatory Visit (INDEPENDENT_AMBULATORY_CARE_PROVIDER_SITE_OTHER): Payer: Medicare Other | Admitting: Podiatry

## 2023-02-22 DIAGNOSIS — I130 Hypertensive heart and chronic kidney disease with heart failure and stage 1 through stage 4 chronic kidney disease, or unspecified chronic kidney disease: Secondary | ICD-10-CM | POA: Insufficient documentation

## 2023-02-22 DIAGNOSIS — M79676 Pain in unspecified toe(s): Secondary | ICD-10-CM

## 2023-02-22 DIAGNOSIS — N182 Chronic kidney disease, stage 2 (mild): Secondary | ICD-10-CM | POA: Diagnosis not present

## 2023-02-22 DIAGNOSIS — L97822 Non-pressure chronic ulcer of other part of left lower leg with fat layer exposed: Secondary | ICD-10-CM | POA: Insufficient documentation

## 2023-02-22 DIAGNOSIS — E1122 Type 2 diabetes mellitus with diabetic chronic kidney disease: Secondary | ICD-10-CM | POA: Insufficient documentation

## 2023-02-22 DIAGNOSIS — L97328 Non-pressure chronic ulcer of left ankle with other specified severity: Secondary | ICD-10-CM | POA: Diagnosis not present

## 2023-02-22 DIAGNOSIS — B351 Tinea unguium: Secondary | ICD-10-CM | POA: Diagnosis not present

## 2023-02-22 DIAGNOSIS — I872 Venous insufficiency (chronic) (peripheral): Secondary | ICD-10-CM | POA: Diagnosis not present

## 2023-02-22 DIAGNOSIS — E1142 Type 2 diabetes mellitus with diabetic polyneuropathy: Secondary | ICD-10-CM | POA: Diagnosis not present

## 2023-02-22 DIAGNOSIS — E11622 Type 2 diabetes mellitus with other skin ulcer: Secondary | ICD-10-CM | POA: Insufficient documentation

## 2023-02-22 DIAGNOSIS — L84 Corns and callosities: Secondary | ICD-10-CM | POA: Diagnosis not present

## 2023-02-22 DIAGNOSIS — I5022 Chronic systolic (congestive) heart failure: Secondary | ICD-10-CM | POA: Insufficient documentation

## 2023-02-22 NOTE — Telephone Encounter (Signed)
Medication Samples have been provided to the patient.  Drug name: [name]        Strength: 24/26        Qty: 3  LOT: [id number]  Exp.Date: 8/25  Dosing instructions: Take 1 tablet twice daily  The patient has been instructed regarding the correct time, dose, and frequency of taking this medication, including desired effects and most common side effects.   Earl [name] 9:01 AM 02/22/2023

## 2023-02-22 NOTE — Progress Notes (Signed)
Earl [name] ([id number]) [id number] Page 1 of 6 Visit Report for 02/22/2023 Chief Complaint Document Details Patient Name: Date of Service: Earl [name], [place] THA [name] 02/22/2023 11:00 A M Medical Record Number: [id number] Patient Account Number: [id number] Date of Birth/Sex: Treating RN: 26-Jan-1941 (82 y.o. M) Primary Care Provider: Kirtland Bouchard Other Clinician: Referring Provider: Treating Provider/Extender: Collie Siad in Treatment: 6 Information Obtained from: Patient Chief Complaint Patient presents for treatment of an open ulcer due Earl venous insufficiency Electronic Signature(s) Signed: 02/22/2023 12:20:35 PM By: Fredirick Maudlin MD FACS Entered By: Fredirick Maudlin on 02/22/2023 12:20:35 -------------------------------------------------------------------------------- HPI Details Patient Name: Date of Service: Earl [name], Earl [name] 02/22/2023 11:00 A M Medical Record Number: [id number] Patient Account Number: [id number] Date of Birth/Sex: Treating RN: 05/07/1941 (82 y.o. M) Primary Care Provider: Kirtland Bouchard Other Clinician: Referring Provider: Treating Provider/Extender: Collie Siad in Treatment: 6 History of Present Illness HPI Description: ADMISSION 01/09/2023 This is an 82 year old type II diabetic (most recent hemoglobin A1c 6.3%) with congestive heart failure, CKD stage II, and CVA with residual deficits. Near the beginning of the year, he had increased swelling in his legs and developed an ulcer on his right calf. Apparently this has been treated with Betadine sugar dressings per his PCPs office. His right leg has multiple superficial open ulcers that all look like they started with blisters. There is slough accumulation in the posterior calf ulcer. He has a palpable pedal pulse. The skin of both legs is tight and shiny. 01/18/2023: The silver alginate that we applied last  week has completely stuck down Earl all of the open wound sites. Edema control is improved. 01/25/2023: All of the wounds on his right leg have healed. Unfortunately, a small wound has opened on his left medial lower leg, just medial and proximal Earl the ankle. It is small and circular and just penetrates the fat layer. 02/08/2023: The wound is about the same size. There is some slough on the surface. 02/15/2023: The wound remains unchanged. 02/22/2023: His wound is healed. Electronic Signature(s) Signed: 02/22/2023 12:20:56 PM By: Fredirick Maudlin MD FACS Entered By: Fredirick Maudlin on 02/22/2023 12:20:56 -------------------------------------------------------------------------------- Physical Exam Details Patient Name: Date of Service: Earl [name], Earl [name] 02/22/2023 11:00 A M Medical Record Number: [id number] Patient Account Number: [id number] Date of Birth/Sex: Treating RN: May 21, 1941 (82 y.o. M) Primary Care Provider: Kirtland Bouchard Other Clinician: Referring Provider: Treating Provider/Extender: Collie Siad in Treatment: 6 Earl [name], Earl [name] ([id number]) [id number] Page 2 of 6 Constitutional . . . . no acute distress. Respiratory Normal work of breathing on room air. Notes 02/22/2023: His wound is healed. Electronic Signature(s) Signed: 02/22/2023 12:21:32 PM By: Fredirick Maudlin MD FACS Entered By: Fredirick Maudlin on 02/22/2023 12:21:32 -------------------------------------------------------------------------------- Physician Orders Details Patient Name: Date of Service: Earl [name], Earl [name] 02/22/2023 11:00 A M Medical Record Number: [id number] Patient Account Number: [id number] Date of Birth/Sex: Treating RN: 1941-10-29 (82 y.o. [name] Primary Care Provider: Kirtland Bouchard Other Clinician: Referring Provider: Treating Provider/Extender: Collie Siad in Treatment: 6 Verbal /  Phone Orders: No Diagnosis Coding ICD-10 Coding Code Description [phone] Non-pressure chronic ulcer of other part of left lower leg with fat layer exposed I87.2 Venous insufficiency (chronic) (peripheral) E11.622 Type 2 diabetes mellitus with other skin ulcer [id number] Chronic systolic (congestive) heart failure I69.30 Unspecified sequelae of cerebral infarction N18.2 Chronic kidney disease, stage 2 (mild)  Discharge From [facility] Services Discharge from [place] your wound is healed! Edema Control - Lymphedema / SCD / Other Left Lower Extremity Patient Earl wear own compression stockings every day. Exercise regularly Moisturize legs daily. Other Edema Control Orders/Instructions: - Tubigrip from [employer] [facility] and compression stocking when you get home Electronic Signature(s) Signed: 02/22/2023 12:21:41 PM By: Fredirick Maudlin MD FACS Entered By: Fredirick Maudlin on 02/22/2023 12:21:41 -------------------------------------------------------------------------------- Problem List Details Patient Name: Date of Service: Earl [name], Earl [name] 02/22/2023 11:00 A M Medical Record Number: [id number] Patient Account Number: [id number] Date of Birth/Sex: Treating RN: 1941-08-29 (82 y.o. M) Primary Care Provider: Kirtland Bouchard Other Clinician: Referring Provider: Treating Provider/Extender: Collie Siad in Treatment: 6 Active Problems ICD-10 Encounter Code Description Active Date MDM Diagnosis [phone] Non-pressure chronic ulcer of other part of left lower leg with fat layer exposed3/04/2023 No Yes Earl [name], Earl [name] ([id number]) [phone].pdf Page 3 of 6 I87.2 Venous insufficiency (chronic) (peripheral) 01/09/2023 No Yes E11.622 Type 2 diabetes mellitus with other skin ulcer 01/09/2023 No Yes [id number] Chronic systolic (congestive) heart failure 01/09/2023 No Yes I69.30 Unspecified sequelae of cerebral infarction 01/09/2023 No  Yes N18.2 Chronic kidney disease, stage 2 (mild) 01/09/2023 No Yes Inactive Problems Resolved Problems ICD-10 Code Description Active Date Resolved Date L97.211 Non-pressure chronic ulcer of right calf limited Earl breakdown of skin 01/09/2023 01/09/2023 L97.811 Non-pressure chronic ulcer of other part of right lower leg limited Earl breakdown of skin 01/09/2023 01/09/2023 Electronic Signature(s) Signed: 02/22/2023 12:20:25 PM By: Fredirick Maudlin MD FACS Entered By: Fredirick Maudlin on 02/22/2023 12:20:24 -------------------------------------------------------------------------------- Progress Note Details Patient Name: Date of Service: Earl [name], Earl [name] 02/22/2023 11:00 A M Medical Record Number: [id number] Patient Account Number: [id number] Date of Birth/Sex: Treating RN: [birth year] (82 y.o. M) Primary Care Provider: Kirtland Bouchard Other Clinician: Referring Provider: Treating Provider/Extender: Collie Siad in Treatment: 6 Subjective Chief Complaint Information obtained from Patient Patient presents for treatment of an open ulcer due Earl venous insufficiency History of Present Illness (HPI) ADMISSION 01/09/2023 This is an 82 year old type II diabetic (most recent hemoglobin A1c 6.3%) with congestive heart failure, CKD stage II, and CVA with residual deficits. Near the beginning of the year, he had increased swelling in his legs and developed an ulcer on his right calf. Apparently this has been treated with Betadine sugar dressings per his PCPs office. His right leg has multiple superficial open ulcers that all look like they started with blisters. There is slough accumulation in the posterior calf ulcer. He has a palpable pedal pulse. The skin of both legs is tight and shiny. 01/18/2023: The silver alginate that we applied last week has completely stuck down Earl all of the open wound sites. Edema control is improved. 01/25/2023: All of the wounds on his right  leg have healed. Unfortunately, a small wound has opened on his left medial lower leg, just medial and proximal Earl the ankle. It is small and circular and just penetrates the fat layer. 02/08/2023: The wound is about the same size. There is some slough on the surface. 02/15/2023: The wound remains unchanged. 02/22/2023: His wound is healed. Earl [name], Earl [name] ([id number]) [id number] Page 4 of 6 Patient History Social History Never smoker, Marital Status - Widowed, Alcohol Use - Never, Drug Use - No History, Caffeine Use - Never. Medical History Eyes Denies history of Cataracts, Glaucoma, Optic Neuritis Ear/Nose/Mouth/Throat Denies history of Chronic sinus problems/congestion, Middle ear problems Cardiovascular Patient has  history of Arrhythmia, Congestive Heart Failure, Hypertension Endocrine Patient has history of Type II Diabetes Genitourinary Denies history of End Stage Renal Disease Neurologic Denies history of Neuropathy Oncologic Denies history of Received Chemotherapy, Received Radiation Psychiatric Denies history of Anorexia/bulimia, Confinement Anxiety Objective Constitutional no acute distress. Vitals Time Taken: 11:28 AM, Height: 71 in, Weight: 151 lbs, BMI: 21.1, Temperature: 98.1 F, Pulse: 66 bpm, Respiratory Rate: 20 breaths/min, Blood Pressure: 122/68 mmHg, Capillary Blood Glucose: 134 mg/dl. Respiratory Normal work of breathing on room air. General Notes: 02/22/2023: His wound is healed. Integumentary (Hair, Skin) Wound #4 status is Healed - Epithelialized. Original cause of wound was Blister. The date acquired was: 01/25/2023. The wound has been in treatment 4 weeks. The wound is located on the Left,Medial,Anterior Ankle. The wound measures 0cm length x 0cm width x 0cm depth; 0cm^2 area and 0cm^3 volume. There is no tunneling or undermining noted. There is a none present amount of drainage noted. The wound margin is distinct with the  outline attached Earl the wound base. There is no granulation within the wound bed. There is no necrotic tissue within the wound bed. The periwound skin appearance had no abnormalities noted for texture. The periwound skin appearance had no abnormalities noted for moisture. The periwound skin appearance had no abnormalities noted for color. Periwound temperature was noted as No Abnormality. The periwound has tenderness on palpation. Assessment Active Problems ICD-10 Non-pressure chronic ulcer of other part of left lower leg with fat layer exposed Venous insufficiency (chronic) (peripheral) Type 2 diabetes mellitus with other skin ulcer Chronic systolic (congestive) heart failure Unspecified sequelae of cerebral infarction Chronic kidney disease, stage 2 (mild) Plan Discharge From [facility] Services: Discharge from [place] your wound is healed! Edema Control - Lymphedema / SCD / Other: Patient Earl wear own compression stockings every day. Exercise regularly Moisturize legs daily. Other Edema Control Orders/Instructions: - Tubigrip from [facility] and compression stocking when you get home Earl [name], Earl [name] ([id number]) [id number] Page 5 of 6 02/22/2023: His wound is healed. He did not bring his other compression stocking with him today so we will apply Tubigrip. He was instructed Earl put his compression stocking on when he gets home. He was reminded of the importance of wearing his compression stockings religiously and elevating his legs is much as possible throughout the day and at night when he sleeps. We will discharge him from the wound care center. He may follow-up as needed. Electronic Signature(s) Signed: 02/22/2023 12:22:19 PM By: Fredirick Maudlin MD FACS Entered By: Fredirick Maudlin on 02/22/2023 12:22:19 -------------------------------------------------------------------------------- HxROS Details Patient Name: Date of Service: Earl [name],  Earl [name] 02/22/2023 11:00 A M Medical Record Number: [id number] Patient Account Number: [id number] Date of Birth/Sex: Treating RN: 1941/06/04 (82 y.o. M) Primary Care Provider: Kirtland Bouchard Other Clinician: Referring Provider: Treating Provider/Extender: Collie Siad in Treatment: 6 Eyes Medical History: Negative for: Cataracts; Glaucoma; Optic Neuritis Ear/Nose/Mouth/Throat Medical History: Negative for: Chronic sinus problems/congestion; Middle ear problems Cardiovascular Medical History: Positive for: Arrhythmia; Congestive Heart Failure; Hypertension Endocrine Medical History: Positive for: Type II Diabetes Treated with: Oral agents Blood sugar tested every day: Yes Tested : Genitourinary Medical History: Negative for: End Stage Renal Disease Neurologic Medical History: Negative for: Neuropathy Oncologic Medical History: Negative for: Received Chemotherapy; Received Radiation Psychiatric Medical History: Negative for: Anorexia/bulimia; Confinement Anxiety Immunizations Pneumococcal Vaccine: Received Pneumococcal Vaccination: Yes Received Pneumococcal Vaccination On or After 60th Birthday: Yes Implantable Devices None Family and Social  History Never smoker; Marital Status - Widowed; Alcohol Use: Never; Drug Use: No History; Caffeine Use: Never; Financial Concerns: No; Food, Clothing or Shelter Needs: No; Support System Lacking: No; Transportation Concerns: No Earl [name], Earl [name] ([id number]) [id number] Page 6 of 6 Electronic Signature(s) Signed: 02/22/2023 12:22:58 PM By: Fredirick Maudlin MD FACS Entered By: Fredirick Maudlin on 02/22/2023 12:21:02 -------------------------------------------------------------------------------- SuperBill Details Patient Name: Date of Service: Earl [name], Earl [name] 02/22/2023 Medical Record Number: [id number] Patient Account Number: [id number] Date of Birth/Sex: Treating  RN: May 08, 1941 (82 y.o. [name] Primary Care Provider: Kirtland Bouchard Other Clinician: Referring Provider: Treating Provider/Extender: Marily Lente Weeks in Treatment: 6 Diagnosis Coding ICD-10 Codes Code Description [phone] Non-pressure chronic ulcer of other part of left lower leg with fat layer exposed I87.2 Venous insufficiency (chronic) (peripheral) E11.622 Type 2 diabetes mellitus with other skin ulcer [id number] Chronic systolic (congestive) heart failure I69.30 Unspecified sequelae of cerebral infarction N18.2 Chronic kidney disease, stage 2 (mild) Facility Procedures : CPT4 Code: [id number] Description: 99213 - WOUND CARE VISIT-LEV 3 EST PT Modifier: Quantity: 1 Physician Procedures : CPT4 Code Description Modifier [id number] - WC PHYS LEVEL 3 - EST PT ICD-10 Diagnosis Description L97.822 Non-pressure chronic ulcer of other part of left lower leg with fat layer exposed I87.2 Venous insufficiency (chronic) (peripheral) XX123456  Chronic systolic (congestive) heart failure E11.622 Type 2 diabetes mellitus with other skin ulcer Quantity: 1 Electronic Signature(s) Signed: 02/22/2023 12:22:34 PM By: Fredirick Maudlin MD FACS Entered By: Fredirick Maudlin on 02/22/2023 12:22:34

## 2023-02-23 NOTE — Progress Notes (Signed)
  Subjective:  Patient ID: Earl [name], male    DOB: 11/23/1940,  MRN: [id number]  Jaiyden Friis presents to clinic today for at risk foot care with history of diabetic neuropathy and preulcerative lesion(s) left foot and painful mycotic toenails that limit ambulation. Painful toenails interfere with ambulation. Aggravating factors include wearing enclosed shoe gear. Pain is relieved with periodic professional debridement. Painful porokeratotic lesions are aggravated when weightbearing with and without shoegear. Pain is relieved with periodic professional debridement.  Chief Complaint  Patient presents with  . Nail Problem    DFC BS-134 A1C-6.7 PCP-[facility] PCP VST- 2weeks ago   New problem(s): None.   PCP is McKeown, William, MD.  Allergies  Allergen Reactions  . Penicillins Other (See Comments)    Did it involve swelling of the face/tongue/throat, SOB, or low BP? Unknown Did it involve sudden or severe rash/hives, skin peeling, or any reaction on the inside of your mouth or nose? Unknown Did you need to seek medical attention at a hospital or doctor's office? No When did it last happen?    Over 50 Years Ago   If all above answers are "NO", may proceed with cephalosporin use.      Review of Systems: Negative except as noted in the HPI.  Objective: No changes noted in today's physical examination. There were no vitals filed for this visit. Maruice Giel is a pleasant 82 y.o. male WD, WN in NAD. AAO x 3.  Neurovascular Examination: Capillary refill time to digits immediate b/l. Palpable pedal pulses b/l LE. Pedal hair sparse. No pain with calf compression b/l. Lower extremity skin temperature gradient within normal limits. No edema noted b/l LE. No ischemia or gangrene noted b/l LE. No cyanosis or clubbing noted b/l LE.  He is wearing open toed compression hose today.  Protective sensation intact 5/5 intact bilaterally with 10g monofilament b/l. Vibratory  sensation intact b/l.  Dermatological:  Pedal skin is warm and supple b/l LE. No open wounds b/l LE. No interdigital macerations noted b/l LE.   Toenails 1-5 bilaterally elongated, discolored, dystrophic, thickened, and crumbly with subungual debris and tenderness to dorsal palpation.   Preulcerative lesion noted distal tip of left hallux with subdermal hemorrhage No erythema, no edema, no drainage, no fluctuance.  Musculoskeletal:  Muscle strength 5/5 to all lower extremity muscle groups bilaterally. No pain, crepitus or joint limitation noted with ROM bilateral LE.  Assessment/Plan: 1. Pain due to onychomycosis of toenail   2. Pre-ulcerative calluses   3. Diabetic polyneuropathy associated with type 2 diabetes mellitus    -Consent given for treatment as described below: -Examined patient. -Mycotic toenails 1-5 bilaterally were debrided in length and girth with sterile nail nippers and dremel without incident. -Preulcerative lesion pared 1-5 bilaterally and left great toe utilizing sterile scalpel blade. Total number pared=1. -Patient/POA to call should there be question/concern in the interim.   Return in about 3 months (around 05/24/2023).  Uno Esau L Sula Fetterly, DPM  

## 2023-02-23 NOTE — Progress Notes (Incomplete)
  Subjective:  Patient ID: Earl [name], male    DOB: October 02, 1941,  MRN: [id number]  Earl [name] presents to clinic today for at risk foot care with history of diabetic neuropathy and preulcerative lesion(s) left foot and painful mycotic toenails that limit ambulation. Painful toenails interfere with ambulation. Aggravating factors include wearing enclosed shoe gear. Pain is relieved with periodic professional debridement. Painful porokeratotic lesions are aggravated when weightbearing with and without shoegear. Pain is relieved with periodic professional debridement.  Chief Complaint  Patient presents with  . Nail Problem    DFC BS-134 A1C-6.7 PCP-[facility] PCP VST- 2weeks ago   New problem(s): None.   PCP is [name], MD.  Allergies  Allergen Reactions  . Penicillins Other (See Comments)    Did it involve swelling of the face/tongue/throat, SOB, or low BP? Unknown Did it involve sudden or severe rash/hives, skin peeling, or any reaction on the inside of your mouth or nose? Unknown Did you need to seek medical attention at a hospital or doctor's office? No When did it last happen?    Over 18 Years Ago   If all above answers are "NO", may proceed with cephalosporin use.      Review of Systems: Negative except as noted in the HPI.  Objective: No changes noted in today's physical examination. There were no vitals filed for this visit. Earl [name] is a pleasant 82 y.o. male WD, WN in NAD. AAO x 3.  Neurovascular Examination: Capillary refill time to digits immediate b/l. Palpable pedal pulses b/l LE. Pedal hair sparse. No pain with calf compression b/l. Lower extremity skin temperature gradient within normal limits. No edema noted b/l LE. No ischemia or gangrene noted b/l LE. No cyanosis or clubbing noted b/l LE.  He is wearing open toed compression hose today.  Protective sensation intact 5/5 intact bilaterally with 10g monofilament b/l. Vibratory  sensation intact b/l.  Dermatological:  Pedal skin is warm and supple b/l LE. No open wounds b/l LE. No interdigital macerations noted b/l LE.   Toenails 1-5 bilaterally elongated, discolored, dystrophic, thickened, and crumbly with subungual debris and tenderness to dorsal palpation.   Preulcerative lesion noted distal tip of left hallux with subdermal hemorrhage No erythema, no edema, no drainage, no fluctuance.  Musculoskeletal:  Muscle strength 5/5 to all lower extremity muscle groups bilaterally. No pain, crepitus or joint limitation noted with ROM bilateral LE.  Assessment/Plan: 1. Pain due to onychomycosis of toenail   2. Pre-ulcerative calluses   3. Diabetic polyneuropathy associated with type 2 diabetes mellitus    -Consent given for treatment as described below: -Examined patient. -Mycotic toenails 1-5 bilaterally were debrided in length and girth with sterile nail nippers and dremel without incident. -Preulcerative lesion pared 1-5 bilaterally and left great toe utilizing sterile scalpel blade. Total number pared=1. -Patient/POA to call should there be question/concern in the interim.   Return in about 3 months (around 05/24/2023).  Marzetta Board, DPM

## 2023-02-23 NOTE — Progress Notes (Signed)
[name], [name] ([id number]) [id number] Page 1 of 7 Visit Report for 02/22/2023 Arrival Information Details Patient Name: Date of Service: TO Earl [name], [place] [name] 02/22/2023 11:00 A M Medical Record Number: [id number] Patient Account Number: [id number] Date of Birth/Sex: Treating RN: 1941-01-25 (82 y.o. M) Primary Care Devetta Hagenow: Kirtland Bouchard Other Clinician: Referring Jaimie Redditt: Treating Nabila Albarracin/Extender: Collie Siad in Treatment: 6 Visit Information History Since Last Visit All ordered tests and consults were completed: No Patient Arrived: Earl [name] Added or deleted any medications: No Arrival Time: 11:27 Any new allergies or adverse reactions: No Accompanied By: daughter Had a fall or experienced change in No Transfer Assistance: None activities of daily living that may affect Patient Identification Verified: Yes risk of falls: Secondary Verification Process Completed: Yes Signs or symptoms of abuse/neglect since last visito No Patient Requires Transmission-Based Precautions: No Hospitalized since last visit: No Patient Has Alerts: Yes Implantable device outside of the clinic excluding No Patient Alerts: Patient on Blood Thinner cellular tissue based products placed in the center since last visit: Pain Present Now: No Electronic Signature(s) Signed: 02/22/2023 11:53:29 AM By: Earl [name] Entered By: Earl [name] on 02/22/2023 11:28:08 -------------------------------------------------------------------------------- Clinic Level of Care Assessment Details Patient Name: Date of Service: TO Earl [name] [name] 02/22/2023 11:00 A M Medical Record Number: [id number] Patient Account Number: [id number] Date of Birth/Sex: Treating RN: 1941/08/02 (82 y.o. [name] Primary Care Kaia [place]: Kirtland Bouchard Other Clinician: Referring Neelah Mannings: Treating Anie Juniel/Extender: Collie Siad in  Treatment: 6 Clinic Level of Care Assessment Items TOOL 4 Quantity Score X- 1 0 Use when only an EandM is performed on FOLLOW-UP visit ASSESSMENTS - Nursing Assessment / Reassessment X- 1 10 Reassessment of Co-morbidities (includes updates in patient status) X- 1 5 Reassessment of Adherence to Treatment Plan ASSESSMENTS - Wound and Skin A ssessment / Reassessment X - Simple Wound Assessment / Reassessment - one wound 1 5 []  - 0 Complex Wound Assessment / Reassessment - multiple wounds []  - 0 Dermatologic / Skin Assessment (not related to wound area) ASSESSMENTS - Focused Assessment []  - 0 Circumferential Edema Measurements - multi extremities []  - 0 Nutritional Assessment / Counseling / Intervention []  - 0 Lower Extremity Assessment (monofilament, tuning fork, pulses) []  - 0 Peripheral Arterial Disease Assessment (using hand held doppler) ASSESSMENTS - Ostomy and/or Continence Assessment and Care []  - 0 Incontinence Assessment and Management []  - 0 Ostomy Care Assessment and Management (repouching, etc.) PROCESS - Coordination of Care X - Simple Patient / Family Education for ongoing care 1 15 Earl [name], [name] ([id number]) [id number] Page 2 of 7 []  - 0 Complex (extensive) Patient / Family Education for ongoing care []  - 0 Staff obtains Programmer, systems, Records, T Results / Process Orders est X- 1 10 Staff telephones HHA, Nursing Homes / Clarify orders / etc X- 1 10 Routine Transfer to another Facility (non-emergent condition) []  - 0 Routine Hospital Admission (non-emergent condition) []  - 0 New Admissions / Biomedical engineer / Ordering NPWT Apligraf, etc. , []  - 0 Emergency Hospital Admission (emergent condition) X- 1 10 Simple Discharge Coordination []  - 0 Complex (extensive) Discharge Coordination PROCESS - Special Needs []  - 0 Pediatric / Minor Patient Management []  - 0 Isolation Patient Management []  - 0 Hearing / Language /  Visual special needs []  - 0 Assessment of Community assistance (transportation, D/C planning, etc.) []  - 0 Additional assistance / Altered mentation []  - 0 Support Surface(s) Assessment (bed, cushion, seat, etc.)  INTERVENTIONS - Wound Cleansing / Measurement X - Simple Wound Cleansing - one wound 1 5 []  - 0 Complex Wound Cleansing - multiple wounds X- 1 5 Wound Imaging (photographs - any number of wounds) []  - 0 Wound Tracing (instead of photographs) X- 1 5 Simple Wound Measurement - one wound []  - 0 Complex Wound Measurement - multiple wounds INTERVENTIONS - Wound Dressings []  - 0 Small Wound Dressing one or multiple wounds []  - 0 Medium Wound Dressing one or multiple wounds []  - 0 Large Wound Dressing one or multiple wounds []  - 0 Application of Medications - topical []  - 0 Application of Medications - injection INTERVENTIONS - Miscellaneous []  - 0 External ear exam []  - 0 Specimen Collection (cultures, biopsies, blood, body fluids, etc.) []  - 0 Specimen(s) / Culture(s) sent or taken to Lab for analysis []  - 0 Patient Transfer (multiple staff / Civil Service fast streamer / Similar devices) []  - 0 Simple Staple / Suture removal (25 or less) []  - 0 Complex Staple / Suture removal (26 or more) []  - 0 Hypo / Hyperglycemic Management (close monitor of Blood Glucose) []  - 0 Ankle / Brachial Index (ABI) - do not check if billed separately X- 1 5 Vital Signs Has the patient been seen at the hospital within the last three years: Yes Total Score: 85 Level Of Care: New/Established - Level 3 Electronic Signature(s) Signed: 02/22/2023 5:52:50 PM By: Earl Catholic RN Entered By: Earl [name] on 02/22/2023 12:09:04 Earl [name] ([id number] Page 3 of 7 -------------------------------------------------------------------------------- Encounter Discharge Information Details Patient Name: Date of Service: TO Earl [name], [place] [name] 02/22/2023 11:00  A M Medical Record Number: [id number] Patient Account Number: [id number] Date of Birth/Sex: Treating RN: 11-11-1941 (82 y.o. [name] Primary Care Nazly Digilio: Kirtland Bouchard Other Clinician: Referring Hatsue Sime: Treating Kinesha Auten/Extender: Collie Siad in Treatment: 6 Encounter Discharge Information Items Discharge Condition: Stable Ambulatory Status: Walker Discharge Destination: Home Transportation: Private Auto Accompanied By: self Schedule Follow-up Appointment: Yes Clinical Summary of Care: Patient Declined Electronic Signature(s) Signed: 02/22/2023 5:52:50 PM By: Earl Catholic RN Entered By: Earl [name] on 02/22/2023 12:09:49 -------------------------------------------------------------------------------- Lower Extremity Assessment Details Patient Name: Date of Service: TO Earl [name], NA [name] 02/22/2023 11:00 A M Medical Record Number: [id number] Patient Account Number: [id number] Date of Birth/Sex: Treating RN: 06-10-1941 (82 y.o. [name] Primary Care Marypat Kimmet: Kirtland Bouchard Other Clinician: Referring Fabian Coca: Treating Mirage Pfefferkorn/Extender: Marily Lente Weeks in Treatment: 6 Edema Assessment Assessed: [Left: No] [Right: No] Edema: [Left: Ye] [Right: s] Calf Left: Right: Point of Measurement: From Medial Instep 33 cm Ankle Left: Right: Point of Measurement: From Medial Instep 24.4 cm Vascular Assessment Pulses: Dorsalis Pedis Palpable: [Left:Yes] Electronic Signature(s) Signed: 02/22/2023 5:52:50 PM By: Earl Catholic RN Entered By: Earl [name] on 02/22/2023 11:35:50 -------------------------------------------------------------------------------- Multi Wound Chart Details Patient Name: Date of Service: TO Earl [name], NA [name] 02/22/2023 11:00 A M Medical Record Number: [id number] Patient Account Number: [id number] Date of Birth/Sex: Treating RN: March 16, 1941 (82 y.o. M) Primary Care  Shahed Yeoman: Kirtland Bouchard Other Clinician: Referring Brandilee Pies: Treating Lunabella Badgett/Extender: Collie Siad in Treatment: 6 [place], [name] ([id number]) [id number] Page 4 of 7 Vital Signs Height(in): 71 Capillary Blood Glucose(mg/dl): 134 Weight(lbs): 151 Pulse(bpm): 66 Body Earl Index(BMI): 21.1 Blood Pressure(mmHg): 122/68 Temperature(F): 98.1 Respiratory Rate(breaths/min): 20 [4:Photos:] [N/A:N/A] Left, Medial, Anterior Ankle N/A N/A Wound Location: Blister N/A N/A Wounding Event: Diabetic Wound/Ulcer of the Lower N/A N/A Primary  Etiology: Extremity Arrhythmia, Congestive Heart Failure, N/A N/A Comorbid History: Hypertension, Type II Diabetes 01/25/2023 N/A N/A Date Acquired: 4 N/A N/A Weeks of Treatment: Healed - Epithelialized N/A N/A Wound Status: No N/A N/A Wound Recurrence: 0x0x0 N/A N/A Measurements L x W x D (cm) 0 N/A N/A A (cm) : rea 0 N/A N/A Volume (cm) : 100.00% N/A N/A % Reduction in A rea: 100.00% N/A N/A % Reduction in Volume: Grade 1 N/A N/A Classification: None Present N/A N/A Exudate A mount: Distinct, outline attached N/A N/A Wound Margin: None Present (0%) N/A N/A Granulation A mount: None Present (0%) N/A N/A Necrotic A mount: Fascia: No N/A N/A Exposed Structures: Fat Layer (Subcutaneous Tissue): No Tendon: No Muscle: No Joint: No Bone: No Large (67-100%) N/A N/A Epithelialization: Excoriation: No N/A N/A Periwound Skin Texture: Induration: No Callus: No Crepitus: No Rash: No Scarring: No Maceration: No N/A N/A Periwound Skin Moisture: Dry/Scaly: No Hemosiderin Staining: Yes N/A N/A Periwound Skin Color: Atrophie Blanche: No Cyanosis: No Ecchymosis: No Erythema: No Mottled: No Pallor: No Rubor: No No Abnormality N/A N/A Temperature: Yes N/A N/A Tenderness on Palpation: Treatment Notes Electronic Signature(s) Signed: 02/22/2023 12:20:30 PM By:  Fredirick Maudlin MD FACS Entered By: Fredirick Maudlin on 02/22/2023 12:20:29 -------------------------------------------------------------------------------- Multi-Disciplinary Care Plan Details Patient Name: Date of Service: TO Earl [name], NA [name] 02/22/2023 11:00 A M Medical Record Number: [id number] Patient Account Number: [id number] Date of Birth/Sex: Treating RN: [birth year] (82 y.o. Tonye Becket [place], [name] ([id number]) [id number] Page 5 of 7 Primary Care [place]: Kirtland Bouchard Other Clinician: Referring Maryfrances Portugal: Treating Loriana Samad/Extender: Collie Siad in Treatment: [place] reviewed with physician Active Inactive Electronic Signature(s) Signed: 02/22/2023 5:52:50 PM By: Earl Catholic RN Entered By: Earl [name] on 02/22/2023 12:08:00 -------------------------------------------------------------------------------- Pain Assessment Details Patient Name: Date of Service: TO Earl [name], NA [name] 02/22/2023 11:00 A M Medical Record Number: [id number] Patient Account Number: [id number] Date of Birth/Sex: Treating RN: Apr 09, 1941 (82 y.o. M) Primary Care Letti Towell: Kirtland Bouchard Other Clinician: Referring Chanze Teagle: Treating Nigil Braman/Extender: Collie Siad in Treatment: 6 Active Problems Location of Pain Severity and Description of Pain Patient Has Paino No Site Locations Pain Management and Medication Current Pain Management: Electronic Signature(s) Signed: 02/22/2023 11:53:29 AM By: Earl [name] Entered By: Earl [name] on 02/22/2023 11:28:39 -------------------------------------------------------------------------------- Patient/Caregiver Education Details Patient Name: Date of Service: TO Earl [name], NA [name] 4/3/2024andnbsp11:00 A M Medical Record Number: [id number] Patient Account Number: [id number] Date of Birth/Gender: Treating RN: [birth year] (82  y.o. [name] Primary Care Physician: Kirtland Bouchard Other Clinician: Referring Physician: Treating Physician/Extender: Collie Siad in Treatment: 6 Education Assessment Education Provided To: Earl [name] ([id number]) [id number] Page 6 of 7 Patient Education Topics Provided Wound/Skin Impairment: Methods: Explain/Verbal Responses: Return demonstration correctly Electronic Signature(s) Signed: 02/22/2023 5:52:50 PM By: Earl Catholic RN Entered By: Earl [name] on 02/22/2023 12:08:13 -------------------------------------------------------------------------------- Wound Assessment Details Patient Name: Date of Service: TO Earl [name], NA [name] 02/22/2023 11:00 A M Medical Record Number: [id number] Patient Account Number: [id number] Date of Birth/Sex: Treating RN: 04-16-1941 (82 y.o. M) Primary Care Shaleigh Laubscher: Kirtland Bouchard Other Clinician: Referring Libra Gatz: Treating Xaniyah Buchholz/Extender: Marily Lente Weeks in Treatment: 6 Wound Status Wound Number: 4 Primary Diabetic Wound/Ulcer of the Lower Extremity Etiology: Wound Location: Left, Medial, Anterior Ankle Wound Status: Healed - Epithelialized Wounding Event: Blister Comorbid Arrhythmia, Congestive Heart Failure, Hypertension, Type II Date Acquired: 01/25/2023  History: Diabetes Weeks Of Treatment: 4 Clustered Wound: No Photos Wound Measurements Length: (cm) Width: (cm) Depth: (cm) Area: (cm) Volume: (cm) 0 % Reduction in Area: 100% 0 % Reduction in Volume: 100% 0 Epithelialization: Large (67-100%) 0 Tunneling: No 0 Undermining: No Wound Description Classification: Grade 1 Wound Margin: Distinct, outline attached Exudate Amount: None Present Foul Odor After Cleansing: No Slough/Fibrino No Wound Bed Granulation Amount: None Present (0%) Exposed Structure Necrotic Amount: None Present (0%) Fascia Exposed: No Fat  Layer (Subcutaneous Tissue) Exposed: No Tendon Exposed: No Muscle Exposed: No Joint Exposed: No Bone Exposed: No Periwound Skin Texture Texture Color No Abnormalities Noted: Yes No Abnormalities Noted: Yes Moisture Temperature / Pain No Abnormalities Noted: Yes Temperature: No Abnormality RUSTON, RAPOZO ([id number] Page 7 of 7 Tenderness on Palpation: Yes Electronic Signature(s) Signed: 02/22/2023 5:52:50 PM By: Earl Catholic RN Entered By: Earl [name] on 02/22/2023 11:48:40 -------------------------------------------------------------------------------- Vitals Details Patient Name: Date of Service: TO Earl [name], NA [name] 02/22/2023 11:00 A M Medical Record Number: [id number] Patient Account Number: [id number] Date of Birth/Sex: Treating RN: 06-24-1941 (82 y.o. M) Primary Care Jamelle Noy: Kirtland Bouchard Other Clinician: Referring Jamaar Howes: Treating Chelsia Serres/Extender: Collie Siad in Treatment: 6 Vital Signs Time Taken: 11:28 Temperature (F): 98.1 Height (in): 71 Pulse (bpm): 66 Weight (lbs): 151 Respiratory Rate (breaths/min): 20 Body Earl Index (BMI): 21.1 Blood Pressure (mmHg): 122/68 Capillary Blood Glucose (mg/dl): 134 Reference Range: 80 - 120 mg / dl Electronic Signature(s) Signed: 02/22/2023 11:53:29 AM By: Earl [name] Entered By: Earl [name] on 02/22/2023 11:28:33

## 2023-02-24 ENCOUNTER — Encounter: Payer: Self-pay | Admitting: Neurology

## 2023-02-24 ENCOUNTER — Ambulatory Visit: Payer: Medicare Other | Admitting: Neurology

## 2023-02-24 VITALS — BP 103/62 | HR 71 | Ht 71.0 in | Wt 155.0 lb

## 2023-02-24 DIAGNOSIS — Z8673 Personal history of transient ischemic attack (TIA), and cerebral infarction without residual deficits: Secondary | ICD-10-CM | POA: Diagnosis not present

## 2023-02-24 DIAGNOSIS — R471 Dysarthria and anarthria: Secondary | ICD-10-CM

## 2023-02-24 DIAGNOSIS — R2681 Unsteadiness on feet: Secondary | ICD-10-CM

## 2023-02-24 DIAGNOSIS — R292 Abnormal reflex: Secondary | ICD-10-CM

## 2023-02-24 NOTE — Patient Instructions (Signed)
MRI brain without contrast  Home health referral for physical therapy

## 2023-02-24 NOTE — Progress Notes (Signed)
Follow-up Visit   Date: 02/24/2023    Earl [name] MRN: [id number] DOB: 1941/03/15    Earl [name] is a 82 y.o. 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  The patient was accompanied to the clinic by daughter who also provides collateral information.    IMPRESSION/PLAN: Gait instability and falls, worsening.  Patient has had increased falls and difficulty with ambulating, reporting that 6 months ago, he was able to walk to his mailbox with his walker, but now he is dragging the right leg and has difficulty with balance. Speech is also mildly slurred at times.  I will check MRI brain wo contrast and request home PT to assist with leg strengthening and balance.   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  He is having greater difficulty with fine motor movements and grasping objects because of numbness and weakness.  - Continue Amvuttra            - He is very high risk for falls.  Fall precautions discussed, he is compliant with always using a walker                 3.  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  He was referred for neurological evaluation of his neuropathy.  Patient has a long history of neuropathy, which initially manifested with burning pain in  the feet.  However, over the years, he no longer has any pain.  He takes gabapentin 600mg  at bedtime.   Now, he has predominately numbness in the feet and lower legs.  He has weakness of the right leg from stroke.  He has poor balance and has been using a rigid walker for the past 1.5 year.  He would like to use a cane, but it's too unsteady.  He uses a transport chair when outside of the home.  He had one fall this year. Diabetes is well-controlled.    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     He lives in a one level home and daughter is the primary care giver.    Nonsmoker and never drank alcohol.  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    UPDATE 12/14/2022:  He is here for follow-up visit accompanied by daughter.  Daughter reports she has noticed that he tends to cough with eating and is concerned of risk of aspiration.  He has not had any choking spells.  She also is concerned that he tends to drag the right leg when walking and is very unsteady.  He has completed home PT which has helped and would like it extended.  He is having ongoing difficulty with holding objects and tends to drop things frequently.    UPDATE 02/24/2023:  Daughter scheduled a sooner visit due to patient having increased falls over the past two weeks.  He had a fall in front of his daughter and  another fall with his caregiver were walking outside.  He could tell he was going to fall because of leg weakness, but unable to break his fall.  Fortunately, he has not had any injuries.  Daughter feels that he is dragging the right leg more and speech is also slurred at times.    Medications:  Current Outpatient Medications on File Prior to Visit  Medication Sig Dispense Refill   AMVUTTRA 25 MG/0.5ML syringe Inject into the skin.     apixaban (ELIQUIS) 5 MG TABS tablet TAKE 1 TABLET BY MOUTH  TWICE DAILY TO PREVENT  BLOOD CLOTS 180 tablet 3    atorvastatin (LIPITOR) 40 MG tablet TAKE 1 TABLET BY MOUTH  DAILY FOR CHOLESTEROL 90 tablet 3   baclofen (LIORESAL) 10 MG tablet Start 5mg  (half tablet) at bedtime x 1 week, then increase to half tablet twice daily. 30 each 5   Blood Glucose Monitoring Suppl (ONE TOUCH ULTRA 2) w/Device KIT Check blood sugar 1 time a day-DX-E11.9 1 kit 0   Cholecalciferol (VITAMIN D PO) Take 1 tablet by mouth daily.      clotrimazole-betamethasone (LOTRISONE) cream Apply to affected area 2 to 4 x / day 45 g 3   Cyanocobalamin (B-12) 50 MCG TABS Take 50 mcg by mouth daily.     ferrous sulfate 325 (65 FE) MG tablet Take 325 mg by mouth at bedtime.     finasteride (PROSCAR) 5 MG tablet TAKE 1 TABLET BY MOUTH DAILY FOR PROSTATE 70 tablet 4   fluconazole (DIFLUCAN) 150 MG tablet Take 1 tablet 2 x /week  if needed for yeast infection 16 tablet 1   glipiZIDE (GLUCOTROL) 5 MG tablet Patient takes 0.5 tablet by mouth twice a day.     Lancets (ONETOUCH DELICA PLUS LANCET30G) MISC CHECK BLOOD SUGAR ONCE A DAY. 100 each 3   Lancets Misc. (ONE TOUCH SURESOFT) MISC Check blood sugar 1 time a day-DX-E11.91 1 each 0   levothyroxine (SYNTHROID) 50 MCG tablet Take  1 tablet  Daily  on an empty stomach with only water for 30 minutes & no Antacid meds, Calcium or Magnesium for 4 hours & avoid Biotin                                          /                              TAKE                      BY                          MOUTH 90 tablet 3   Magnesium 250 MG TABS Take 250 mg by mouth daily.      metFORMIN (GLUCOPHAGE-XR) 500 MG 24 hr tablet TAKE 2 TABLETS BY MOUTH TWICE  DAILY WITH MEALS FOR DIABETES 280 tablet 4   metoprolol succinate (TOPROL-XL) 50 MG 24 hr tablet TAKE 1 TABLET BY MOUTH AT  BEDTIME 100 tablet 2   Multiple Vitamin (MULTIVITAMIN) capsule Take 1 capsule by mouth daily.     NONFORMULARY OR COMPOUNDED ITEM Please dispense a Rolling Walker.  Dx:R26.81 & G63 1 each 0   ONETOUCH ULTRA test strip CHECK BLOOD SUGAR ONCE  A DAY. 100  strip 3   potassium chloride SA (KLOR-CON M) 20 MEQ tablet Take 1 tablet (20 mEq total) by mouth daily. 90 tablet 3   sacubitril-valsartan (ENTRESTO) 24-26 MG Take 1 tablet by mouth 2 (two) times daily. 180 tablet 3   spironolactone (ALDACTONE) 25 MG tablet Take 1 tablet (25 mg total) by mouth at bedtime. 90 tablet 3   Tafamidis (VYNDAMAX) 61 MG CAPS Take 1 capsule by mouth daily. 30 capsule 11   torsemide (DEMADEX) 20 MG tablet Take 1 tablet (20 mg total) by mouth 2 (two) times daily. 60 tablet 1   triamcinolone cream (KENALOG) 0.1 % Apply 1 Application topically as needed.     trolamine salicylate (ASPERCREME) 10 % cream Apply 1 application topically daily as needed for muscle pain.     VITAMIN A PO Take by mouth. 3000 mg     vitamin C (ASCORBIC ACID) 500 MG tablet Take 500 mg by mouth daily.     No current facility-administered medications on file prior to visit.    Allergies:  Allergies  Allergen Reactions   Penicillins Other (See Comments)    Did it involve swelling of the face/tongue/throat, SOB, or low BP? Unknown Did it involve sudden or severe rash/hives, skin peeling, or any reaction on the inside of your mouth or nose? Unknown Did you need to seek medical attention at a hospital or doctor's office? No When did it last happen?    Over 82 Years Ago   If all above answers are "NO", may proceed with cephalosporin use.      Vital Signs:  BP 103/62   Pulse 71   Ht  (1.803 m)   Wt 155 lb (70.3 kg)   SpO2 100%   BMI 21.62 kg/m    Neurological Exam: MENTAL STATUS including orientation to time, place, person, recent and remote memory, attention span and concentration, language, and fund of knowledge is normal.  Speech is mildly dysarthric with prolonged talking.  CRANIAL NERVES:     Normal conjugate, extra-ocular eye movements in all directions of gaze.  No ptosis.  Face is symmetric.  MOTOR:  Severe bilateral ABP atrophy, no fasciculations or abnormal  movements.  Motor strength is 5/5 RUE proximally, 3/5 distally, RLE 4/5, LUE and LLE 5/5.  No pronator drift.  spasticity in the RLE.  MSRs:                                           Right        Left brachioradialis 3+  3+  biceps 3+  3+  triceps 3+  3+  patellar 3+  3+  ankle jerk 3+  3+   SENSORY:  Vibration, temperature and pin prick absent below the ankles and reduced at the knees.     COORDINATION/GAIT: Normfal finger-to- nose-finger.  Intact rapid alternating movements bilaterally. Gait  not tested.  Patient arrived in wheelchair.    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 A superimposed severe left carpal tunnel syndrome is also present.   MRI cervical spine wo contrast 09/06/2022: 1. Normal appearance of the cervical cord. 2. Ordinary cervical spine degeneration with foraminal impingement bilaterally at C3-4, C4-5 and on the right at C6-7.  MRI brain 07/26/2017: 1. Acute ischemic superior right  cerebellar infarct without significant mass effect. Associated mild petechial hemorrhage without frank hemorrhagic transformation. 2. Otherwise normal brain MRI.  Thank you for allowing me to participate in patient's care.  If I can answer any additional questions, I would be pleased to do so.    Sincerely,    Isom Kochan K. Allena Katz, DO

## 2023-02-27 ENCOUNTER — Telehealth: Payer: Self-pay

## 2023-02-27 DIAGNOSIS — E1122 Type 2 diabetes mellitus with diabetic chronic kidney disease: Secondary | ICD-10-CM | POA: Diagnosis not present

## 2023-02-27 DIAGNOSIS — I480 Paroxysmal atrial fibrillation: Secondary | ICD-10-CM | POA: Diagnosis not present

## 2023-02-27 DIAGNOSIS — I700 Atherosclerosis of aorta: Secondary | ICD-10-CM | POA: Diagnosis not present

## 2023-02-27 DIAGNOSIS — N182 Chronic kidney disease, stage 2 (mild): Secondary | ICD-10-CM | POA: Diagnosis not present

## 2023-02-27 DIAGNOSIS — I69351 Hemiplegia and hemiparesis following cerebral infarction affecting right dominant side: Secondary | ICD-10-CM | POA: Diagnosis not present

## 2023-02-27 DIAGNOSIS — E1141 Type 2 diabetes mellitus with diabetic mononeuropathy: Secondary | ICD-10-CM | POA: Diagnosis not present

## 2023-02-27 NOTE — Telephone Encounter (Signed)
Home Health verbal orders  Agency Name: [facility]  Requesting PT  Frequency: 2 W 3 // 1 W 3   Please forward to [facility] pool or providers CMA

## 2023-02-28 ENCOUNTER — Encounter: Payer: Self-pay | Admitting: Neurology

## 2023-02-28 NOTE — Telephone Encounter (Signed)
Called [name] and provided verbal orders.

## 2023-03-01 ENCOUNTER — Ambulatory Visit (HOSPITAL_BASED_OUTPATIENT_CLINIC_OR_DEPARTMENT_OTHER): Payer: Medicare Other | Admitting: General Surgery

## 2023-03-02 DIAGNOSIS — E1122 Type 2 diabetes mellitus with diabetic chronic kidney disease: Secondary | ICD-10-CM | POA: Diagnosis not present

## 2023-03-02 DIAGNOSIS — E1141 Type 2 diabetes mellitus with diabetic mononeuropathy: Secondary | ICD-10-CM | POA: Diagnosis not present

## 2023-03-02 DIAGNOSIS — I480 Paroxysmal atrial fibrillation: Secondary | ICD-10-CM | POA: Diagnosis not present

## 2023-03-02 DIAGNOSIS — N182 Chronic kidney disease, stage 2 (mild): Secondary | ICD-10-CM | POA: Diagnosis not present

## 2023-03-02 DIAGNOSIS — I700 Atherosclerosis of aorta: Secondary | ICD-10-CM | POA: Diagnosis not present

## 2023-03-02 DIAGNOSIS — I69351 Hemiplegia and hemiparesis following cerebral infarction affecting right dominant side: Secondary | ICD-10-CM | POA: Diagnosis not present

## 2023-03-07 ENCOUNTER — Ambulatory Visit
Admission: RE | Admit: 2023-03-07 | Discharge: 2023-03-07 | Disposition: A | Discharge status: Home / Self Care | Payer: Medicare Other | Source: Ambulatory Visit | Attending: Neurology | Admitting: Neurology

## 2023-03-07 DIAGNOSIS — R471 Dysarthria and anarthria: Secondary | ICD-10-CM

## 2023-03-07 DIAGNOSIS — Z8673 Personal history of transient ischemic attack (TIA), and cerebral infarction without residual deficits: Secondary | ICD-10-CM | POA: Diagnosis not present

## 2023-03-07 DIAGNOSIS — R2681 Unsteadiness on feet: Secondary | ICD-10-CM

## 2023-03-07 DIAGNOSIS — R292 Abnormal reflex: Secondary | ICD-10-CM

## 2023-03-07 DIAGNOSIS — R296 Repeated falls: Secondary | ICD-10-CM | POA: Diagnosis not present

## 2023-03-09 DIAGNOSIS — I700 Atherosclerosis of aorta: Secondary | ICD-10-CM | POA: Diagnosis not present

## 2023-03-09 DIAGNOSIS — I480 Paroxysmal atrial fibrillation: Secondary | ICD-10-CM | POA: Diagnosis not present

## 2023-03-09 DIAGNOSIS — E1141 Type 2 diabetes mellitus with diabetic mononeuropathy: Secondary | ICD-10-CM | POA: Diagnosis not present

## 2023-03-09 DIAGNOSIS — N182 Chronic kidney disease, stage 2 (mild): Secondary | ICD-10-CM | POA: Diagnosis not present

## 2023-03-09 DIAGNOSIS — E1122 Type 2 diabetes mellitus with diabetic chronic kidney disease: Secondary | ICD-10-CM | POA: Diagnosis not present

## 2023-03-09 DIAGNOSIS — I69351 Hemiplegia and hemiparesis following cerebral infarction affecting right dominant side: Secondary | ICD-10-CM | POA: Diagnosis not present

## 2023-03-09 NOTE — Progress Notes (Signed)
KERRINGTON, OESTERLE ([id number]) [id number] Page 1 of 3 Visit Report for 02/01/2023 Arrival Information Details Patient Name: Date of Service: TO [name], [name] [name] 02/01/2023 3:00 PM Medical Record Number: [id number] Patient Account Number: [id number] Date of Birth/Sex: Treating RN: [birth year] (82 y.o. Damaris Schooner Primary Care Kamaryn Grimley: Marinus Maw Other Clinician: Referring Yetta Marceaux: Treating Hedaya Latendresse/Extender: Monia Pouch in Treatment: 3 Visit Information History Since Last Visit Added or deleted any medications: No Patient Arrived: Dan Humphreys Any new allergies or adverse reactions: No Arrival Time: 15:21 Had a fall or experienced change in No Accompanied By: daughter activities of daily living that may affect Transfer Assistance: None risk of falls: Patient Identification Verified: Yes Signs or symptoms of abuse/neglect since last visito No Secondary Verification Process Completed: Yes Hospitalized since last visit: No Patient Requires Transmission-Based Precautions: No Implantable device outside of the clinic excluding No Patient Has Alerts: Yes cellular tissue based products placed in the center Patient Alerts: Patient on Blood Thinner since last visit: Has Dressing in Place as Prescribed: Yes Pain Present Now: No Electronic Signature(s) Signed: 02/01/2023 5:03:32 PM By: Zenaida Deed RN, BSN Entered By: Zenaida Deed on 02/01/2023 15:26:24 -------------------------------------------------------------------------------- Encounter Discharge Information Details Patient Name: Date of Service: TO [name], [place] [name] 02/01/2023 3:00 PM Medical Record Number: [id number] Patient Account Number: [id number] Date of Birth/Sex: Treating RN: 09-Aug-1941 (82 y.o. Yates Decamp Primary Care Ashawna Hanback: Marinus Maw Other Clinician: Referring Symia Herdt: Treating Sharday Michl/Extender: Monia Pouch in Treatment: 3 Encounter Discharge Information Items Discharge Condition: Stable Ambulatory Status: Walker Discharge Destination: Home Transportation: Private Auto Accompanied By: daughter Schedule Follow-up Appointment: Yes Clinical Summary of Care: Patient Declined Electronic Signature(s) Signed: 03/09/2023 1:55:51 PM By: Brenton Grills Entered By: Brenton Grills on 02/01/2023 15:49:21 -------------------------------------------------------------------------------- Patient/Caregiver Education Details Patient Name: Date of Service: TO [name], [place] [name] 3/13/2024andnbsp3:00 PM Medical Record Number: [id number] Patient Account Number: [id number] Date of Birth/Gender: Treating RN: 10/02/1941 (82 y.o. Yates Decamp Primary Care Physician: Marinus Maw Other Clinician: Referring Physician: Treating Physician/Extender: Monia Pouch in Treatment: 3 Education Assessment Judithann Sauger ([id number]) [id number] Page 2 of 3 Education Provided To: Patient and Caregiver Education Topics Provided Wound/Skin Impairment: Methods: Explain/Verbal, Printed Responses: State content correctly Electronic Signature(s) Signed: 03/09/2023 1:55:51 PM By: Brenton Grills Entered By: Brenton Grills on 02/01/2023 15:48:50 -------------------------------------------------------------------------------- Wound Assessment Details Patient Name: Date of Service: TO [name], NA [name] 02/01/2023 3:00 PM Medical Record Number: [id number] Patient Account Number: [id number] Date of Birth/Sex: Treating RN: 10-21-1941 (82 y.o. Damaris Schooner Primary Care Quintara Bost: Marinus Maw Other Clinician: Referring Kavina Cantave: Treating Rossanna Spitzley/Extender: Daylene Posey Weeks in Treatment: 3 Wound Status Wound Number: 4 Primary Etiology: Diabetic Wound/Ulcer of the Lower Extremity Wound Location: Left, Medial,  Anterior Ankle Wound Status: Open Wounding Event: Blister Date Acquired: 01/25/2023 Weeks Of Treatment: 1 Clustered Wound: No Wound Measurements Length: (cm) 0.4 Width: (cm) 0.4 Depth: (cm) 0.1 Area: (cm) 0.126 Volume: (cm) 0.013 % Reduction in Area: 0% % Reduction in Volume: 0% Wound Description Classification: Exudate Amount: Exudate Type: Exudate Color: Grade 1 Medium Serous amber Periwound Skin Texture Texture Color No Abnormalities Noted: No No Abnormalities Noted: No Moisture No Abnormalities Noted: No Electronic Signature(s) Signed: 02/01/2023 5:03:32 PM By: Zenaida Deed RN, BSN Entered By: Zenaida Deed on 02/01/2023 15:27:01 -------------------------------------------------------------------------------- Vitals Details Patient Name: Date of Service: TO [name], NA [name] 02/01/2023 3:00 PM Medical Record  Number: [id number] Patient Account Number: [id number] Date of Birth/Sex: Treating RN: [birth year] (82 y.o. Damaris Schooner Primary Care Hades Mathew: Marinus Maw Other Clinician: Referring Elysha Daw: Treating Misti Towle/Extender: Daylene Posey Weeks in Treatment: 3 Vital Signs Time Taken: 15:26 Temperature (F): 98.1 FINLEE, MILO ([id number]) [id number] Page 3 of 3 Height (in): 71 Pulse (bpm): 64 Weight (lbs): 151 Respiratory Rate (breaths/min): 18 Body Mass Index (BMI): 21.1 Blood Pressure (mmHg): 102/65 Capillary Blood Glucose (mg/dl): 782 Reference Range: 80 - 120 mg / dl Electronic Signature(s) Signed: 02/01/2023 5:03:32 PM By: Zenaida Deed RN, BSN Entered By: Zenaida Deed on 02/01/2023 15:26:53

## 2023-03-09 NOTE — Progress Notes (Signed)
[name], [name] ([id number]) [id number] Page 1 of 1 Visit Report for 02/01/2023 SuperBill Details Patient Name: Date of Service: TO [name], [name] [name] 02/01/2023 Medical Record Number: [id number] Patient Account Number: [id number] Date of Birth/Sex: Treating RN: 1941-09-01 (82 y.o. 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

## 2023-03-10 DIAGNOSIS — E1141 Type 2 diabetes mellitus with diabetic mononeuropathy: Secondary | ICD-10-CM | POA: Diagnosis not present

## 2023-03-10 DIAGNOSIS — I480 Paroxysmal atrial fibrillation: Secondary | ICD-10-CM | POA: Diagnosis not present

## 2023-03-10 DIAGNOSIS — N182 Chronic kidney disease, stage 2 (mild): Secondary | ICD-10-CM | POA: Diagnosis not present

## 2023-03-10 DIAGNOSIS — I700 Atherosclerosis of aorta: Secondary | ICD-10-CM | POA: Diagnosis not present

## 2023-03-10 DIAGNOSIS — I69351 Hemiplegia and hemiparesis following cerebral infarction affecting right dominant side: Secondary | ICD-10-CM | POA: Diagnosis not present

## 2023-03-10 DIAGNOSIS — E1122 Type 2 diabetes mellitus with diabetic chronic kidney disease: Secondary | ICD-10-CM | POA: Diagnosis not present

## 2023-03-13 ENCOUNTER — Other Ambulatory Visit: Payer: Self-pay

## 2023-03-14 DIAGNOSIS — I69351 Hemiplegia and hemiparesis following cerebral infarction affecting right dominant side: Secondary | ICD-10-CM | POA: Diagnosis not present

## 2023-03-14 DIAGNOSIS — E1141 Type 2 diabetes mellitus with diabetic mononeuropathy: Secondary | ICD-10-CM | POA: Diagnosis not present

## 2023-03-14 DIAGNOSIS — E1122 Type 2 diabetes mellitus with diabetic chronic kidney disease: Secondary | ICD-10-CM | POA: Diagnosis not present

## 2023-03-14 DIAGNOSIS — N182 Chronic kidney disease, stage 2 (mild): Secondary | ICD-10-CM | POA: Diagnosis not present

## 2023-03-14 DIAGNOSIS — I700 Atherosclerosis of aorta: Secondary | ICD-10-CM | POA: Diagnosis not present

## 2023-03-14 DIAGNOSIS — I480 Paroxysmal atrial fibrillation: Secondary | ICD-10-CM | POA: Diagnosis not present

## 2023-03-15 ENCOUNTER — Ambulatory Visit: Payer: Medicare Other | Admitting: Internal Medicine

## 2023-03-16 ENCOUNTER — Other Ambulatory Visit (HOSPITAL_COMMUNITY): Payer: Self-pay

## 2023-03-16 DIAGNOSIS — N182 Chronic kidney disease, stage 2 (mild): Secondary | ICD-10-CM | POA: Diagnosis not present

## 2023-03-16 DIAGNOSIS — I480 Paroxysmal atrial fibrillation: Secondary | ICD-10-CM | POA: Diagnosis not present

## 2023-03-16 DIAGNOSIS — E1141 Type 2 diabetes mellitus with diabetic mononeuropathy: Secondary | ICD-10-CM | POA: Diagnosis not present

## 2023-03-16 DIAGNOSIS — I69351 Hemiplegia and hemiparesis following cerebral infarction affecting right dominant side: Secondary | ICD-10-CM | POA: Diagnosis not present

## 2023-03-16 DIAGNOSIS — E1122 Type 2 diabetes mellitus with diabetic chronic kidney disease: Secondary | ICD-10-CM | POA: Diagnosis not present

## 2023-03-16 DIAGNOSIS — I700 Atherosclerosis of aorta: Secondary | ICD-10-CM | POA: Diagnosis not present

## 2023-03-17 ENCOUNTER — Telehealth: Payer: Self-pay | Admitting: Anesthesiology

## 2023-03-17 NOTE — Telephone Encounter (Signed)
Please provide diagnosis of cerebellar stroke, unsteady gait, and falls.

## 2023-03-17 NOTE — Telephone Encounter (Signed)
Earl [name] with [facility] left message with AN stating she needs verbal orders to clarify a diagnosis approving a cerebellar stroke as his primary focus of care for home health services. Fax [phone] and call back number is [phone].

## 2023-03-20 NOTE — Telephone Encounter (Signed)
Called and gave Curley Spice verbal orders per Dr. Allena Katz.

## 2023-03-21 ENCOUNTER — Encounter: Payer: Self-pay | Admitting: Internal Medicine

## 2023-03-21 ENCOUNTER — Ambulatory Visit (INDEPENDENT_AMBULATORY_CARE_PROVIDER_SITE_OTHER): Payer: Medicare Other | Admitting: Internal Medicine

## 2023-03-21 VITALS — BP 104/58 | HR 61 | Temp 97.8°F | Ht 71.0 in | Wt 150.0 lb

## 2023-03-21 DIAGNOSIS — E559 Vitamin D deficiency, unspecified: Secondary | ICD-10-CM | POA: Diagnosis not present

## 2023-03-21 DIAGNOSIS — N182 Chronic kidney disease, stage 2 (mild): Secondary | ICD-10-CM

## 2023-03-21 DIAGNOSIS — E785 Hyperlipidemia, unspecified: Secondary | ICD-10-CM

## 2023-03-21 DIAGNOSIS — I700 Atherosclerosis of aorta: Secondary | ICD-10-CM | POA: Diagnosis not present

## 2023-03-21 DIAGNOSIS — E1169 Type 2 diabetes mellitus with other specified complication: Secondary | ICD-10-CM

## 2023-03-21 DIAGNOSIS — Z79899 Other long term (current) drug therapy: Secondary | ICD-10-CM

## 2023-03-21 DIAGNOSIS — I6930 Unspecified sequelae of cerebral infarction: Secondary | ICD-10-CM

## 2023-03-21 DIAGNOSIS — E039 Hypothyroidism, unspecified: Secondary | ICD-10-CM | POA: Diagnosis not present

## 2023-03-21 DIAGNOSIS — I10 Essential (primary) hypertension: Secondary | ICD-10-CM

## 2023-03-21 DIAGNOSIS — E1122 Type 2 diabetes mellitus with diabetic chronic kidney disease: Secondary | ICD-10-CM | POA: Diagnosis not present

## 2023-03-21 DIAGNOSIS — E1141 Type 2 diabetes mellitus with diabetic mononeuropathy: Secondary | ICD-10-CM | POA: Diagnosis not present

## 2023-03-21 DIAGNOSIS — I69351 Hemiplegia and hemiparesis following cerebral infarction affecting right dominant side: Secondary | ICD-10-CM | POA: Diagnosis not present

## 2023-03-21 DIAGNOSIS — I480 Paroxysmal atrial fibrillation: Secondary | ICD-10-CM | POA: Diagnosis not present

## 2023-03-21 NOTE — Progress Notes (Signed)
Future Appointments  Date Time Provider Department  03/21/2023  4:00 PM [name], MD [facility]  04/25/2023  2:30 PM Nita Sickle K, DO [facility]  07/05/2023  3:30 PM [name], DPM [facility]  09/26/2023                           cpe  3:00 PM [name], MD [facility]    History of Present Illness:       This very nice 82 y.o. [facility] presents for 6 month follow up with HTN, HLD, T2_NIDDM and Vitamin D Deficiency.  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  Abdominal CT scan in 2016 found Aortic Atherosclerosis.        Patient is treated for HTN (1980) & BP has been controlled . Today's BP is at goal -   104/58.   In 2018, patient had a Rt Cerebellar thrombotic CVA and was started on Eliquis for Afib.  He has persistent Rt sided HP & ataxia. Patient is followed by Dr Graciela Husbands & associates.  Patient has had no complaints of any cardiac type chest pain, palpitations, dyspnea Pollyann Kennedy /PND, dizziness, claudication or dependent edema.        Hyperlipidemia is controlled with diet & Atorvastatin. Patient denies myalgias or other med SE's. 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  Last A1c was   Lab Results  Component Value Date   HGBA1C 6.4 (H) 10/19/2020                                               Patient was dx'd Hypothyroid in 2014 and was initiated on Thyroid Replacement & Goiter suppression.         Further, the patient also has history of Vitamin D Deficiency ("30" /2008) and supplements vitamin D without any suspected side-effects. Last vitamin D was at goal:  Lab Results  Component Value Date   VD25OH 64  07/13/2020    Current Outpatient Medications on File Prior to Visit  Medication Sig   atorvastatin  40 MG tablet    VITAMIN D  Take 1 tablet daily.    Cyanocobalamin (B-12) 50 MCG TABS Take 50 mcg  daily.   ELIQUIS 5 MG TABS tablet TAKE 1 TABLET   TWICE DAILY    finasteride  5 MG tablet TAKE 1 TABLET   DAILY FOR PROSTATE   furosemide  40 MG tablet Take 1 tablet  daily.   Gabapentin 300 MG capsule Take 1 to 2 capsules 1 hour before Bedtime    glipiZIDE  5 MG tablet Take  daily.   levothyroxine   50 MCG tablet TAKE 1 TABLET   DAILY   Magnesium 250 MG TABS Take daily.    metFORMIN-XR 500 MG 24 hr tablet TAKE 2 TABLETS  2  TIMES DAILY    Multiple Vitamin  capsule Take 1 capsule  daily.   sodium chloride.65 %  nasal spray Place 2 sprays into the nose as needed for congestion.   triamcinolone ointment  0.1 % Apply topically 2  times daily.   vitamin C 500 MG tablet Take  daily.       PMHx:   Past Medical History:  Diagnosis Date   Adenomatous colon polyp    Allergy    Anal fissure    Arthritis    BPH (benign prostatic hyperplasia)    Diabetes mellitus (HCC)    GERD (gastroesophageal reflux disease)    Hyperlipidemia    Hypertension    Hypothyroidism    Other testicular hypofunction    Stroke (HCC)     Immunization History  Administered Date(s) Administered   DT (Pediatric) 02/09/2015   Influenza Split 08/07/2013   Influenza, High Dose Seasonal PF 08/19/2014, 08/28/2015, 09/13/2016, 08/15/2017, 08/15/2018, 09/06/2019, 10/19/2020   PFIZER(Purple Top)SARS-COV-2 Vaccination 12/11/2019, 01/01/2020   Pneumococcal Conjugate-13 06/10/2016   Pneumococcal Polysaccharide-23 08/13/2008   Td 11/22/2003    Past Surgical History:  Procedure Laterality Date   CIRCUMCISION N/A 06/11/2019   Procedure: CIRCUMCISION ADULT;  Surgeon: Jerilee Field, MD;  Location: [facility]S;  Service: Urology;  Laterality: N/A;   COLONOSCOPY     INGUINAL HERNIA REPAIR Bilateral    1968 and 2004    LOOP RECORDER INSERTION N/A 07/28/2017   Procedure: LOOP RECORDER INSERTION;  Surgeon: Duke Salvia, MD;  Location: [facility] INVASIVE CV LAB;  Service: Cardiovascular;  Laterality: N/A;   TEE WITHOUT CARDIOVERSION N/A 07/28/2017   Procedure: TRANSESOPHAGEAL ECHOCARDIOGRAM (TEE);  Surgeon: Quintella Reichert, MD;  Location: [facility] ENDOSCOPY;  Service: Cardiovascular;  Laterality: N/A;    FHx:    Reviewed / unchanged  SHx:    Reviewed / unchanged   Systems Review:  Constitutional: Denies fever, chills, wt changes, headaches, insomnia, fatigue, night sweats, change in appetite. Eyes: Denies redness, blurred vision, diplopia, discharge, itchy, watery eyes.  ENT: Denies discharge, congestion, post nasal drip, epistaxis, sore throat, earache, hearing loss, dental pain, tinnitus, vertigo, sinus pain, snoring.  CV: Denies chest pain, palpitations, irregular heartbeat, syncope, dyspnea, diaphoresis, orthopnea, PND, claudication or edema. Respiratory: denies cough, dyspnea, DOE, pleurisy, hoarseness, laryngitis, wheezing.  Gastrointestinal: Denies dysphagia, odynophagia, heartburn, reflux, water brash, abdominal pain or cramps, nausea, vomiting, bloating, diarrhea, constipation, hematemesis, melena, hematochezia  or hemorrhoids. Genitourinary: Denies dysuria, frequency, urgency, nocturia, hesitancy, discharge, hematuria or flank pain. Musculoskeletal: Denies arthralgias, myalgias, stiffness, jt. swelling, pain, limping or strain/sprain.  Skin: Denies pruritus, rash, hives, warts, acne, eczema or change in skin lesion(s). Neuro: No weakness, tremor, incoordination, spasms, paresthesia or pain. Psychiatric: Denies confusion, memory loss or sensory loss. Endo: Denies change in weight, skin or hair change.  Heme/Lymph: No excessive bleeding, bruising or enlarged lymph nodes.  Physical Exam  BP (!) 104/58   Pulse 61   Temp 97.8 F (36.6 C)   Ht 5\' 11"  (1.803 m)   Wt 150 lb (68 kg)   SpO2 98%   BMI 20.92 kg/m    Appears  well nourished, well groomed  and in no distress.  Eyes: PERRLA, EOMs, conjunctiva no swelling or erythema. Sinuses: No frontal/maxillary tenderness ENT/Mouth: EAC's clear, TM's nl w/o erythema, bulging. Nares clear w/o erythema, swelling, exudates. Oropharynx clear without erythema or exudates. Oral hygiene is good. Tongue normal, non obstructing. Hearing intact.  Neck: Supple. Thyroid not palpable. Car 2+/2+ without bruits, nodes or JVD. Chest: Respirations nl with BS clear & equal w/o rales, rhonchi, wheezing or stridor.  Cor: Heart sounds normal w/  regular rate and rhythm without sig. murmurs, gallops, clicks or rubs. Peripheral pulses normal and equal  without edema.  Abdomen: Soft & bowel sounds normal. Non-tender w/o guarding, rebound, hernias, masses or organomegaly.  Lymphatics: Unremarkable.  Musculoskeletal: Full ROM all peripheral extremities, joint stability, 5/5 strength and normal gait.  Skin: Warm, dry without exposed rashes, lesions or ecchymosis apparent.  Neuro: Cranial nerves intact, reflexes equal bilaterally. Sensory-motor testing grossly intact. Tendon reflexes grossly intact.  Pysch: Alert & oriented x 3.  Insight and judgement nl & appropriate. No ideations.  Assessment and Plan:  1. Essential hypertension  - Continue medication, monitor blood pressure at home.  - Continue DASH diet.  Reminder to go to the ER if any CP,  SOB, nausea, dizziness, severe HA, changes vision/speech.  - CBC with Differential/Platelet - COMPLETE METABOLIC PANEL WITH GFR - Magnesium - TSH  2. Hyperlipidemia associated with type 2 diabetes mellitus (HCC)  - Continue diet/meds, exercise,& lifestyle modifications.  - Continue monitor periodic cholesterol/liver & renal functions   - Lipid panel - TSH  3. Type 2 diabetes mellitus with stage 2 chronic kidney  disease, without long-term current use of insulin (HCC)  - Continue diet, exercise  - Lifestyle modifications.   - Monitor appropriate labs  - Hemoglobin A1c - Insulin, random  4. Vitamin D deficiency  - Continue supplementation.  - VITAMIN D 25 Hydroxyl  5. Hypothyroidism  - TSH  6. History of CVA with residual deficit   7. Medication management  - CBC with Differential/Platelet - COMPLETE METABOLIC PANEL WITH GFR - Magnesium - Lipid panel - TSH - Hemoglobin A1c - Insulin, random - VITAMIN D 25 Hydroxy         Discussed  regular exercise, BP monitoring, weight control to achieve/maintain BMI less than 25 and discussed med and SE's. Recommended labs to assess and monitor clinical status with further disposition pending results of labs.  I discussed the assessment and treatment plan with the patient. The patient was provided an opportunity to ask questions and all were answered. The patient agreed with the plan and demonstrated an understanding of the instructions.  I provided over 30 minutes of exam, counseling, chart review and  complex critical decision making.         The patient was advised to call back or seek an in-person evaluation if the symptoms worsen or if the condition fails to improve as anticipated.   Marinus Maw, MD

## 2023-03-21 NOTE — Patient Instructions (Signed)
Due to recent changes in healthcare laws, you may see the results of your imaging and laboratory studies on MyChart before your provider has had a chance to review them.  We understand that in some cases there may be results that are confusing or concerning to you. Not all laboratory results come back in the same time frame and the provider may be waiting for multiple results in order to interpret others.  Please give us 48 hours in order for your provider to thoroughly review all the results before contacting the office for clarification of your results.  ++++++++++++++++++++++++++  Vit D  & Vit C 1,000 mg   are recommended to help protect  against the Covid-19 and other Corona viruses.    Also it's recommended  to take  Zinc 50 mg  to help  protect against the Covid-19   and best place to get  is also on [employer]  and don't pay more than 6-8 cents /pill !   +++++++++++++++++++++++++++++++++++++++ Recommend Adult Low Dose Aspirin or  coated  Aspirin 81 mg daily  To reduce risk of Colon Cancer 40 %,  Skin Cancer 26 % ,  Melanoma 46%  and  Pancreatic cancer 60% +++++++++++++++++++++++++++++++++++++++++ Vitamin D goal  is between 70-100.  Please make sure that you are taking your Vitamin D as directed.  It is very important as a natural anti-inflammatory  helping hair, skin, and nails, as well as reducing stroke and heart attack risk.  It helps your bones and helps with mood. It also decreases numerous cancer risks so please take it as directed.  Low Vit D is associated with a 200-300% higher risk for CANCER  and 200-300% higher risk for HEART   ATTACK  &  STROKE.   ...................................... It is also associated with higher death rate at younger ages,  autoimmune diseases like Rheumatoid arthritis, Lupus, Multiple Sclerosis.    Also many other serious conditions, like depression, Alzheimer's Dementia, infertility, muscle aches, fatigue, fibromyalgia - just to name  a few. +++++++++++++++++++++++++++++++++++++++++ Recommend the book "The END of DIETING" by Dr Joel Fuhrman  & the book "The END of DIABETES " by Dr Joel Fuhrman At [employer] - get book & Audio CD's    Being diabetic has a  300% increased risk for heart attack, stroke, cancer, and alzheimer- type vascular dementia. It is very important that you work harder with diet by avoiding all foods that are white. Avoid white rice (brown & wild rice is OK), white potatoes (sweetpotatoes in moderation is OK), White bread or wheat bread or anything made out of white flour like bagels, donuts, rolls, buns, biscuits, cakes, pastries, cookies, pizza crust, and pasta (made from white flour & egg whites) - vegetarian pasta or spinach or wheat pasta is OK. Multigrain breads like Arnold's or [employer], or multigrain sandwich thins or flatbreads.  Diet, exercise and weight loss can reverse and cure diabetes in the early stages.  Diet, exercise and weight loss is very important in the control and prevention of complications of diabetes which affects every system in your body, ie. Brain - dementia/stroke, eyes - glaucoma/blindness, heart - heart attack/heart failure, kidneys - dialysis, stomach - gastric paralysis, intestines - malabsorption, nerves - severe painful neuritis, circulation - gangrene & loss of a leg(s), and finally cancer and Alzheimers.    I recommend avoid fried & greasy foods,  sweets/candy, white rice (brown or wild rice or Quinoa is OK), white potatoes (sweet potatoes are OK) - anything   made from white flour - bagels, doughnuts, rolls, buns, biscuits,white and wheat breads, pizza crust and traditional pasta made of white flour & egg white(vegetarian pasta or spinach or wheat pasta is OK).  Multi-grain bread is OK - like multi-grain flat bread or sandwich thins. Avoid alcohol in excess. Exercise is also important.    Eat all the vegetables you want - avoid meat, especially red meat and dairy - especially  cheese.  Cheese is the most concentrated form of trans-fats which is the worst thing to clog up our arteries. Veggie cheese is OK which can be found in the fresh produce section at [employer] or [employer] or [employer]  +++++++++++++++++++++++++++++++++++++++ DASH Eating Plan  DASH stands for "Dietary Approaches to Stop Hypertension."   The DASH eating plan is a healthy eating plan that has been shown to reduce high blood pressure (hypertension). Additional health benefits may include reducing the risk of type 2 diabetes mellitus, heart disease, and stroke. The DASH eating plan may also help with weight loss. WHAT DO I NEED TO KNOW ABOUT THE DASH EATING PLAN? For the DASH eating plan, you will follow these general guidelines: Choose foods with a percent daily value for sodium of less than 5% (as listed on the food label). Use salt-free seasonings or herbs instead of table salt or sea salt. Check with your health care provider or pharmacist before using salt substitutes. Eat lower-sodium products, often labeled as "lower sodium" or "no salt added." Eat fresh foods. Eat more vegetables, fruits, and low-fat dairy products. Choose whole grains. Look for the word "whole" as the first word in the ingredient list. Choose fish  Limit sweets, desserts, sugars, and sugary drinks. Choose heart-healthy fats. Eat veggie cheese  Eat more home-cooked food and less restaurant, buffet, and fast food. Limit fried foods. Cook foods using methods other than frying. Limit canned vegetables. If you do use them, rinse them well to decrease the sodium. When eating at a restaurant, ask that your food be prepared with less salt, or no salt if possible.                      WHAT FOODS CAN I EAT? Read Dr Joel Fuhrman's books on The End of Dieting & The End of Diabetes  Grains Whole grain or whole wheat bread. Brown rice. Whole grain or whole wheat pasta. Quinoa, bulgur, and whole grain cereals. Low-sodium  cereals. Corn or whole wheat flour tortillas. Whole grain cornbread. Whole grain crackers. Low-sodium crackers.  Vegetables Fresh or frozen vegetables (raw, steamed, roasted, or grilled). Low-sodium or reduced-sodium tomato and vegetable juices. Low-sodium or reduced-sodium tomato sauce and paste. Low-sodium or reduced-sodium canned vegetables.   Fruits All fresh, canned (in natural juice), or frozen fruits.  Protein Products  All fish and seafood.  Dried beans, peas, or lentils. Unsalted nuts and seeds. Unsalted canned beans.  Dairy Low-fat dairy products, such as skim or 1% milk, 2% or reduced-fat cheeses, low-fat ricotta or cottage cheese, or plain low-fat yogurt. Low-sodium or reduced-sodium cheeses.  Fats and Oils Tub margarines without trans fats. Light or reduced-fat mayonnaise and salad dressings (reduced sodium). Avocado. Safflower, olive, or canola oils. Natural peanut or almond butter.  Other Unsalted popcorn and pretzels. The items listed above may not be a complete list of recommended foods or beverages. Contact your dietitian for more options.  +++++++++++++++  WHAT FOODS ARE NOT RECOMMENDED? Grains/ White flour or wheat flour White bread. White pasta. White rice. Refined   cornbread. Bagels and croissants. Crackers that contain trans fat.  Vegetables  Creamed or fried vegetables. Vegetables in a . Regular canned vegetables. Regular canned tomato sauce and paste. Regular tomato and vegetable juices.  Fruits Dried fruits. Canned fruit in light or heavy syrup. Fruit juice.  Meat and Other Protein Products Meat in general - RED meat & White meat.  Fatty cuts of meat. Ribs, chicken wings, all processed meats as bacon, sausage, bologna, salami, fatback, hot dogs, bratwurst and packaged luncheon meats.  Dairy Whole or 2% milk, cream, half-and-half, and cream cheese. Whole-fat or sweetened yogurt. Full-fat cheeses or blue cheese. Non-dairy creamers and whipped toppings.  Processed cheese, cheese spreads, or cheese curds.  Condiments Onion and garlic salt, seasoned salt, table salt, and sea salt. Canned and packaged gravies. Worcestershire sauce. Tartar sauce. Barbecue sauce. Teriyaki sauce. Soy sauce, including reduced sodium. Steak sauce. Fish sauce. Oyster sauce. Cocktail sauce. Horseradish. Ketchup and mustard. Meat flavorings and tenderizers. Bouillon cubes. Hot sauce. Tabasco sauce. Marinades. Taco seasonings. Relishes.  Fats and Oils Butter, stick margarine, lard, shortening and bacon fat. Coconut, palm kernel, or palm oils. Regular salad dressings.  Pickles and olives. Salted popcorn and pretzels.  The items listed above may not be a complete list of foods and beverages to avoid.  

## 2023-03-22 ENCOUNTER — Other Ambulatory Visit: Payer: Self-pay

## 2023-03-22 ENCOUNTER — Other Ambulatory Visit: Payer: Self-pay | Admitting: Nurse Practitioner

## 2023-03-22 DIAGNOSIS — I5022 Chronic systolic (congestive) heart failure: Secondary | ICD-10-CM

## 2023-03-22 DIAGNOSIS — I509 Heart failure, unspecified: Secondary | ICD-10-CM

## 2023-03-22 LAB — CBC WITH DIFFERENTIAL/PLATELET
Absolute Monocytes: 608 cells/uL (ref 200–950)
Basophils Absolute: 31 cells/uL (ref 0–200)
Basophils Relative: 0.6 %
Eosinophils Absolute: 120 cells/uL (ref 15–500)
Eosinophils Relative: 2.3 %
HCT: 33.1 % — ABNORMAL LOW (ref 38.5–50.0)
Hemoglobin: 11.5 g/dL — ABNORMAL LOW (ref 13.2–17.1)
Lymphs Abs: 1524 cells/uL (ref 850–3900)
MCH: 30.4 pg (ref 27.0–33.0)
MCHC: 34.7 g/dL (ref 32.0–36.0)
MCV: 87.6 fL (ref 80.0–100.0)
MPV: 10.5 fL (ref 7.5–12.5)
Monocytes Relative: 11.7 %
Neutro Abs: 2917 cells/uL (ref 1500–7800)
Neutrophils Relative %: 56.1 %
Platelets: 207 10*3/uL (ref 140–400)
RBC: 3.78 10*6/uL — ABNORMAL LOW (ref 4.20–5.80)
RDW: 12.7 % (ref 11.0–15.0)
Total Lymphocyte: 29.3 %
WBC: 5.2 10*3/uL (ref 3.8–10.8)

## 2023-03-22 LAB — COMPLETE METABOLIC PANEL WITH GFR
AG Ratio: 1.8 (calc) (ref 1.0–2.5)
ALT: 22 U/L (ref 9–46)
AST: 20 U/L (ref 10–35)
Albumin: 3.9 g/dL (ref 3.6–5.1)
Alkaline phosphatase (APISO): 57 U/L (ref 35–144)
BUN/Creatinine Ratio: 33 (calc) — ABNORMAL HIGH (ref 6–22)
BUN: 32 mg/dL — ABNORMAL HIGH (ref 7–25)
CO2: 28 mmol/L (ref 20–32)
Calcium: 9.4 mg/dL (ref 8.6–10.3)
Chloride: 105 mmol/L (ref 98–110)
Creat: 0.96 mg/dL (ref 0.70–1.22)
Globulin: 2.2 g/dL (calc) (ref 1.9–3.7)
Glucose, Bld: 97 mg/dL (ref 65–99)
Potassium: 5.1 mmol/L (ref 3.5–5.3)
Sodium: 140 mmol/L (ref 135–146)
Total Bilirubin: 0.3 mg/dL (ref 0.2–1.2)
Total Protein: 6.1 g/dL (ref 6.1–8.1)
eGFR: 79 mL/min/{1.73_m2} (ref >=60)

## 2023-03-22 LAB — HEMOGLOBIN A1C
Hgb A1c MFr Bld: 6.3 % of total Hgb — ABNORMAL HIGH (ref <5.7)
Mean Plasma Glucose: 134 mg/dL
eAG (mmol/L): 7.4 mmol/L

## 2023-03-22 LAB — LIPID PANEL
Cholesterol: 108 mg/dL (ref <200)
HDL: 53 mg/dL (ref >=40)
LDL Cholesterol (Calc): 42 mg/dL (calc)
Non-HDL Cholesterol (Calc): 55 mg/dL (calc) (ref <130)
Total CHOL/HDL Ratio: 2 (calc) (ref <5.0)
Triglycerides: 46 mg/dL (ref <150)

## 2023-03-22 LAB — TSH: TSH: 0.27 mIU/L — ABNORMAL LOW (ref 0.40–4.50)

## 2023-03-22 LAB — MAGNESIUM: Magnesium: 2 mg/dL (ref 1.5–2.5)

## 2023-03-22 LAB — VITAMIN D 25 HYDROXY (VIT D DEFICIENCY, FRACTURES): Vit D, 25-Hydroxy: 75 ng/mL (ref 30–100)

## 2023-03-22 LAB — INSULIN, RANDOM: Insulin: 9.8 u[IU]/mL

## 2023-03-22 NOTE — Progress Notes (Signed)
^<^<^<^<^<^<^<^<^<^<^<^<^<^<^<^<^<^<^<^<^<^<^<^<^<^<^<^<^<^<^<^<^<^<^<^<^ ^>^>^>^>^>^>^>^>^>^>^>>^>^>^>^>^>^>^>^>^>^>^>^>^>^>^>^>^>^>^>^>^>^>^>^>^>  -  Test results slightly outside the reference range are not unusual. If there is anything important, I will review this with you,  otherwise it is considered normal test values.  If you have further questions,  please do not hesitate to contact me at the office or via My Chart.   ^<^<^<^<^<^<^<^<^<^<^<^<^<^<^<^<^<^<^<^<^<^<^<^<^<^<^<^<^<^<^<^<^<^<^<^<^ ^>^>^>^>^>^>^>^>^>^>^>^>^>^>^>^>^>^>^>^>^>^>^>^>^>^>^>^>^>^>^>^>^>^>^>^>^  -TSH / Thyroid test is sl decreased which means thyroid hormone is slightly increased,     But not enough to warrant changing thyroid  dose at this point                                               ^>^>^>^>^>^>^>^>^>^>^>^>^>^>^>^>^>^>^>^>^>^>^>^>^>^>^>^>^>^>^>^>^>^>^>^>^  - A1c = 6.3% - Still slightly elevated , So work on diet & weight loss                                         ^>^>^>^>^>^>^>^>^>^>^>^>^>^>^>^>^>^>^>^>^>^>^>^>^>^>^>^>^>^>^>^>^>^>^>^>^  - Vitamin D = 75  - Excellent- Please continue dose same  ^>^>^>^>^>^>^>^>^>^>^>^>^>^>^>^>^>^>^>^>^>^>^>^>^>^>^>^>^>^>^>^>^>^>^>^>^ ^>^>^>^>^>^>^>^>^>^>^>^>^>^>^>^>^>^>^>^>^>^>^>^>^>^>^>^>^>^>^>^>^>^>^>^>^  -All Else - CBC - Kidneys - Electrolytes - Liver - Magnesium & Thyroid    - all  Normal / OK ^>^>^>^>^>^>^>^>^>^>^>^>^>^>^>^>^>^>^>^>^>^>^>^>^>^>^>^>^>^>^>^>^>^>^>^>^ ^>^>^>^>^>^>^>^>^>^>^>^>^>^>^>^>^>^>^>^>^>^>^>^>^>^>^>^>^>^>^>^>^>^>^>^>^

## 2023-03-23 ENCOUNTER — Other Ambulatory Visit: Payer: Self-pay | Admitting: Internal Medicine

## 2023-03-23 ENCOUNTER — Encounter: Payer: Self-pay | Admitting: Internal Medicine

## 2023-03-23 DIAGNOSIS — E1122 Type 2 diabetes mellitus with diabetic chronic kidney disease: Secondary | ICD-10-CM

## 2023-03-23 MED ORDER — ONETOUCH DELICA PLUS LANCET30G MISC: 3 refills | Status: DC

## 2023-03-23 MED ORDER — ONETOUCH SURESOFT LANCING DEV MISC
0 refills | Status: DC
Start: 2023-03-23 — End: 2024-08-23

## 2023-03-23 MED ORDER — ONETOUCH ULTRA 2 W/DEVICE KIT: PACK | 0 refills | Status: DC

## 2023-03-23 MED ORDER — ONETOUCH ULTRA VI STRP
ORAL_STRIP | 3 refills | Status: DC
Start: 2023-03-23 — End: 2024-08-23

## 2023-03-30 DIAGNOSIS — N182 Chronic kidney disease, stage 2 (mild): Secondary | ICD-10-CM | POA: Diagnosis not present

## 2023-03-30 DIAGNOSIS — E1141 Type 2 diabetes mellitus with diabetic mononeuropathy: Secondary | ICD-10-CM | POA: Diagnosis not present

## 2023-03-30 DIAGNOSIS — I700 Atherosclerosis of aorta: Secondary | ICD-10-CM | POA: Diagnosis not present

## 2023-03-30 DIAGNOSIS — I480 Paroxysmal atrial fibrillation: Secondary | ICD-10-CM | POA: Diagnosis not present

## 2023-03-30 DIAGNOSIS — E1122 Type 2 diabetes mellitus with diabetic chronic kidney disease: Secondary | ICD-10-CM | POA: Diagnosis not present

## 2023-03-30 DIAGNOSIS — I69351 Hemiplegia and hemiparesis following cerebral infarction affecting right dominant side: Secondary | ICD-10-CM | POA: Diagnosis not present

## 2023-03-31 ENCOUNTER — Telehealth: Payer: Self-pay | Admitting: Neurology

## 2023-03-31 NOTE — Telephone Encounter (Signed)
New message    PT calling needing to extend home health physical therapy for one time a week for four week.

## 2023-03-31 NOTE — Telephone Encounter (Signed)
OK to extend PT. 

## 2023-03-31 NOTE — Telephone Encounter (Signed)
[employer] and gave approval

## 2023-04-06 DIAGNOSIS — I69351 Hemiplegia and hemiparesis following cerebral infarction affecting right dominant side: Secondary | ICD-10-CM | POA: Diagnosis not present

## 2023-04-06 DIAGNOSIS — I480 Paroxysmal atrial fibrillation: Secondary | ICD-10-CM | POA: Diagnosis not present

## 2023-04-06 DIAGNOSIS — E1122 Type 2 diabetes mellitus with diabetic chronic kidney disease: Secondary | ICD-10-CM | POA: Diagnosis not present

## 2023-04-06 DIAGNOSIS — E1141 Type 2 diabetes mellitus with diabetic mononeuropathy: Secondary | ICD-10-CM | POA: Diagnosis not present

## 2023-04-06 DIAGNOSIS — N182 Chronic kidney disease, stage 2 (mild): Secondary | ICD-10-CM | POA: Diagnosis not present

## 2023-04-06 DIAGNOSIS — I700 Atherosclerosis of aorta: Secondary | ICD-10-CM | POA: Diagnosis not present

## 2023-04-11 ENCOUNTER — Other Ambulatory Visit (HOSPITAL_COMMUNITY): Payer: Self-pay

## 2023-04-11 DIAGNOSIS — N182 Chronic kidney disease, stage 2 (mild): Secondary | ICD-10-CM | POA: Diagnosis not present

## 2023-04-11 DIAGNOSIS — I69351 Hemiplegia and hemiparesis following cerebral infarction affecting right dominant side: Secondary | ICD-10-CM | POA: Diagnosis not present

## 2023-04-11 DIAGNOSIS — I700 Atherosclerosis of aorta: Secondary | ICD-10-CM | POA: Diagnosis not present

## 2023-04-11 DIAGNOSIS — I480 Paroxysmal atrial fibrillation: Secondary | ICD-10-CM | POA: Diagnosis not present

## 2023-04-11 DIAGNOSIS — E1141 Type 2 diabetes mellitus with diabetic mononeuropathy: Secondary | ICD-10-CM | POA: Diagnosis not present

## 2023-04-11 DIAGNOSIS — E1122 Type 2 diabetes mellitus with diabetic chronic kidney disease: Secondary | ICD-10-CM | POA: Diagnosis not present

## 2023-04-12 ENCOUNTER — Other Ambulatory Visit: Payer: Self-pay

## 2023-04-12 ENCOUNTER — Other Ambulatory Visit (HOSPITAL_COMMUNITY): Payer: Self-pay | Admitting: Cardiology

## 2023-04-12 ENCOUNTER — Other Ambulatory Visit (HOSPITAL_COMMUNITY): Payer: Self-pay

## 2023-04-12 MED ORDER — VYNDAMAX 61 MG PO CAPS
1.0000 | ORAL_CAPSULE | Freq: Every day | ORAL | 11 refills | Status: DC
  Filled 2023-04-12: qty 30, 30d supply, fill #0
  Filled 2023-05-12: qty 30, 30d supply, fill #1
  Filled 2023-06-07: qty 30, 30d supply, fill #2
  Filled 2023-07-05: qty 30, 30d supply, fill #3
  Filled 2023-08-01: qty 30, 30d supply, fill #4
  Filled 2023-09-05: qty 30, 30d supply, fill #5
  Filled 2023-09-25: qty 30, 30d supply, fill #6
  Filled 2023-10-25: qty 30, 30d supply, fill #7
  Filled 2023-12-04 (×2): qty 30, 30d supply, fill #8
  Filled 2023-12-26: qty 30, 30d supply, fill #9
  Filled 2024-01-29: qty 30, 30d supply, fill #10
  Filled 2024-02-26: qty 30, 30d supply, fill #11

## 2023-04-14 ENCOUNTER — Other Ambulatory Visit: Payer: Self-pay | Admitting: Nurse Practitioner

## 2023-04-20 DIAGNOSIS — E1122 Type 2 diabetes mellitus with diabetic chronic kidney disease: Secondary | ICD-10-CM | POA: Diagnosis not present

## 2023-04-20 DIAGNOSIS — N182 Chronic kidney disease, stage 2 (mild): Secondary | ICD-10-CM | POA: Diagnosis not present

## 2023-04-20 DIAGNOSIS — E1141 Type 2 diabetes mellitus with diabetic mononeuropathy: Secondary | ICD-10-CM | POA: Diagnosis not present

## 2023-04-20 DIAGNOSIS — I480 Paroxysmal atrial fibrillation: Secondary | ICD-10-CM | POA: Diagnosis not present

## 2023-04-20 DIAGNOSIS — I69351 Hemiplegia and hemiparesis following cerebral infarction affecting right dominant side: Secondary | ICD-10-CM | POA: Diagnosis not present

## 2023-04-20 DIAGNOSIS — I700 Atherosclerosis of aorta: Secondary | ICD-10-CM | POA: Diagnosis not present

## 2023-04-25 ENCOUNTER — Encounter: Payer: Self-pay | Admitting: Neurology

## 2023-04-25 ENCOUNTER — Ambulatory Visit: Payer: Medicare Other | Admitting: Neurology

## 2023-04-25 VITALS — BP 103/55 | HR 75 | Ht 71.0 in | Wt 161.0 lb

## 2023-04-25 DIAGNOSIS — E854 Organ-limited amyloidosis: Secondary | ICD-10-CM

## 2023-04-25 DIAGNOSIS — G63 Polyneuropathy in diseases classified elsewhere: Secondary | ICD-10-CM | POA: Diagnosis not present

## 2023-04-25 DIAGNOSIS — I43 Cardiomyopathy in diseases classified elsewhere: Secondary | ICD-10-CM | POA: Diagnosis not present

## 2023-04-25 NOTE — Progress Notes (Signed)
Follow-up Visit   Date: 02/24/2023    Earl [name] MRN: [id number] DOB: [birth year]    Earl [name] is a 82 y.o. 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  The patient was accompanied to the clinic by daughter who also provides collateral information.    IMPRESSION/PLAN: Gait instability and falls, stable.   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  He is having greater difficulty with fine motor movements and grasping objects because of numbness and weakness.  - Continue Amvuttra - Continue daily multivitamin                 3.  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  He was referred for neurological evaluation of his neuropathy.  Patient has a long history of neuropathy, which initially manifested with burning pain in the feet.  However, over the years, he no longer has any pain.  He takes gabapentin 600mg  at bedtime.   Now, he has predominately numbness in the feet and lower legs.  He has weakness of the right leg from stroke.  He has poor balance and has been using a rigid walker for the past 1.5 year.  He would like to use a cane, but it's too unsteady.  He uses a transport chair when outside of the home.  He had one fall this year. Diabetes is  well-controlled.    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     He lives in a one level home and daughter is the primary care giver.    Nonsmoker and never drank alcohol.  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    UPDATE 12/14/2022:  He is here for follow-up visit accompanied by daughter.  Daughter reports she has noticed that he tends to cough with eating and is concerned of risk of aspiration.  He has not had any choking spells.  She also is concerned that he tends to drag the right leg when walking and is very unsteady.  He has completed home PT which has helped and would like it extended.  He is having ongoing difficulty with holding objects and tends to drop things frequently.    UPDATE 02/24/2023:  Daughter scheduled a sooner visit due to patient having increased falls over the past two weeks.  He had a fall in front of his daughter and another fall with his caregiver were walking outside.  He could tell he was going to fall because of leg weakness, but unable to break his fall.  Fortunately, he has not had any injuries.  Daughter feels that he is dragging the right leg more and speech is also slurred at times.    UPDATE 04/25/2023:  He is here for follow-up visit.  He has been doing well with home PT and feels that it has helped.  He did have one fall,  but fortunately did not hurt himself.  He was able to stand up without assistance.  Neuropathy is unchanged and remains with numbness in the hands.  Fine motor tasks are challenging.  He is tolerating Amvuttra and due to for next injection. He will be celebrating his 82nd birthday this weekend and is in good spirits today.    Medications:  Current Outpatient Medications on File Prior to Visit  Medication Sig Dispense Refill   AMVUTTRA 25 MG/0.5ML syringe Inject into the skin.     apixaban (ELIQUIS) 5 MG TABS tablet TAKE 1 TABLET BY MOUTH  TWICE DAILY TO  PREVENT  BLOOD CLOTS 180 tablet 3   atorvastatin (LIPITOR) 40 MG tablet TAKE 1 TABLET BY MOUTH  DAILY FOR CHOLESTEROL 90 tablet 3   baclofen (LIORESAL) 10 MG tablet Start 5mg  (half tablet) at bedtime x 1 week, then increase to half tablet twice daily. 30 each 5   Blood Glucose Monitoring Suppl (ONE TOUCH ULTRA 2) w/Device KIT Check blood sugar 1 time a day-DX-E11.9 1 kit 0   Cholecalciferol (VITAMIN D PO) Take 1 tablet by mouth daily.      clotrimazole-betamethasone (LOTRISONE) cream Apply to affected area 2 to 4 x / day 45 g 3   Cyanocobalamin (B-12) 50 MCG TABS Take 50 mcg by mouth daily.     ferrous sulfate 325 (65 FE) MG tablet Take 325 mg by mouth at bedtime.     finasteride (PROSCAR) 5 MG tablet TAKE 1 TABLET BY MOUTH DAILY FOR PROSTATE 70 tablet 4   fluconazole (DIFLUCAN) 150 MG tablet Take 1 tablet 2 x /week  if needed for yeast infection 16 tablet 1   glipiZIDE (GLUCOTROL) 5 MG tablet Patient takes 0.5 tablet by mouth twice a day.     Lancets (ONETOUCH DELICA PLUS LANCET30G) MISC CHECK BLOOD SUGAR ONCE A DAY. 100 each 3   Lancets Misc. (ONE TOUCH SURESOFT) MISC Check blood sugar 1 time a day-DX-E11.91 1 each 0   levothyroxine (SYNTHROID) 50 MCG tablet Take  1 tablet  Daily  on an empty stomach with only water for 30 minutes & no Antacid meds, Calcium or Magnesium for 4 hours & avoid Biotin                                          /                              TAKE                      BY                          MOUTH 90 tablet 3   Magnesium 250 MG TABS Take 250 mg by mouth daily.      metFORMIN (GLUCOPHAGE-XR) 500 MG 24 hr tablet TAKE 2 TABLETS BY MOUTH TWICE  DAILY WITH MEALS FOR DIABETES 280 tablet 4   metoprolol succinate (TOPROL-XL) 50 MG 24 hr tablet TAKE 1 TABLET BY MOUTH AT  BEDTIME 100 tablet 2   Multiple Vitamin (MULTIVITAMIN) capsule Take 1 capsule by mouth daily.     NONFORMULARY OR COMPOUNDED ITEM Please dispense a Rolling Walker.  Dx:R26.81 & G63 1 each 0   ONETOUCH ULTRA  test strip CHECK BLOOD  SUGAR ONCE A DAY. 100 strip 3   potassium chloride SA (KLOR-CON M) 20 MEQ tablet Take 1 tablet (20 mEq total) by mouth daily. 90 tablet 3   sacubitril-valsartan (ENTRESTO) 24-26 MG Take 1 tablet by mouth 2 (two) times daily. 180 tablet 3   spironolactone (ALDACTONE) 25 MG tablet Take 1 tablet (25 mg total) by mouth at bedtime. 90 tablet 3   Tafamidis (VYNDAMAX) 61 MG CAPS Take 1 capsule by mouth daily. 30 capsule 11   torsemide (DEMADEX) 20 MG tablet Take 1 tablet (20 mg total) by mouth 2 (two) times daily. 60 tablet 1   triamcinolone cream (KENALOG) 0.1 % Apply 1 Application topically as needed.     trolamine salicylate (ASPERCREME) 10 % cream Apply 1 application topically daily as needed for muscle pain.     VITAMIN A PO Take by mouth. 3000 mg     vitamin C (ASCORBIC ACID) 500 MG tablet Take 500 mg by mouth daily.     No current facility-administered medications on file prior to visit.    Allergies:  Allergies  Allergen Reactions   Penicillins Other (See Comments)    Did it involve swelling of the face/tongue/throat, SOB, or low BP? Unknown Did it involve sudden or severe rash/hives, skin peeling, or any reaction on the inside of your mouth or nose? Unknown Did you need to seek medical attention at a hospital or doctor's office? No When did it last happen?    Over 69 Years Ago   If all above answers are "NO", may proceed with cephalosporin use.      Vital Signs:  BP 103/62   Pulse 71   Ht 5\' 11"  (1.803 m)   Wt 155 lb (70.3 kg)   SpO2 100%   BMI 21.62 kg/m    Neurological Exam: MENTAL STATUS including orientation to time, place, person, recent and remote memory, attention span and concentration, language, and fund of knowledge is normal.  Speech is mildly dysarthric with prolonged talking (stable)  CRANIAL NERVES:     Normal conjugate, extra-ocular eye movements in all directions of gaze.  No ptosis.  Face is symmetric.  MOTOR:  Severe bilateral ABP  atrophy, no fasciculations or abnormal movements.  Motor strength is 5/5 RUE proximally, 3/5 distally, RLE 5-/5, LUE and LLE 5/5.  No pronator drift.  Spasticity in the RLE.  MSRs:                                           Right        Left brachioradialis 3+  3+  biceps 3+  3+  triceps 3+  3+  patellar 3+  3+  ankle jerk 3+  3+   SENSORY:  Vibration, temperature and pin prick absent below the ankles and reduced at the knees.     COORDINATION/GAIT: Normfal finger-to- nose-finger.  Intact rapid alternating movements bilaterally. Gait  not tested.  Patient arrived in wheelchair.    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 A superimposed severe left carpal tunnel syndrome is also present.   MRI cervical spine wo contrast 09/06/2022: 1. Normal appearance of the cervical cord. 2. Ordinary cervical spine degeneration with foraminal impingement bilaterally at C3-4, C4-5 and on the right at C6-7.  MRI brain  07/26/2017: 1. Acute ischemic superior right cerebellar infarct without significant mass effect. Associated mild petechial hemorrhage without frank hemorrhagic transformation. 2. Otherwise normal brain MRI.  Thank you for allowing me to participate in patient's care.  If I can answer any additional questions, I would be pleased to do so.    Sincerely,    Davion Meara K. Allena Katz, DO

## 2023-04-25 NOTE — Patient Instructions (Signed)
Happy early [place] Ouch!    Continue your home exercises   I will see you back in 4 months

## 2023-04-26 ENCOUNTER — Telehealth: Payer: Self-pay

## 2023-04-26 NOTE — Telephone Encounter (Signed)
Monique called from [facility] requesting verbal orders for patient. Requesting continuation of Home Physical Therapy for once a week for 6 weeks,

## 2023-04-27 DIAGNOSIS — E1122 Type 2 diabetes mellitus with diabetic chronic kidney disease: Secondary | ICD-10-CM | POA: Diagnosis not present

## 2023-04-27 DIAGNOSIS — E1141 Type 2 diabetes mellitus with diabetic mononeuropathy: Secondary | ICD-10-CM | POA: Diagnosis not present

## 2023-04-27 DIAGNOSIS — I69351 Hemiplegia and hemiparesis following cerebral infarction affecting right dominant side: Secondary | ICD-10-CM | POA: Diagnosis not present

## 2023-04-27 DIAGNOSIS — I700 Atherosclerosis of aorta: Secondary | ICD-10-CM | POA: Diagnosis not present

## 2023-04-27 DIAGNOSIS — I480 Paroxysmal atrial fibrillation: Secondary | ICD-10-CM | POA: Diagnosis not present

## 2023-04-27 DIAGNOSIS — N182 Chronic kidney disease, stage 2 (mild): Secondary | ICD-10-CM | POA: Diagnosis not present

## 2023-04-27 NOTE — Telephone Encounter (Signed)
OK 

## 2023-04-27 NOTE — Telephone Encounter (Signed)
[facility] and provided her with verbal orders.

## 2023-04-28 ENCOUNTER — Telehealth: Payer: Self-pay | Admitting: *Deleted

## 2023-04-28 NOTE — Telephone Encounter (Signed)
General Review Call  Strauss,Lennix  81 years, Male  DOB: 1941/09/02  M: [phone]  __________________________________________________ General Review [facility])  Chart Review Have there been any documented new, changed, or discontinued medications since last visit?  (Include name, dose, frequency, date): no changes Has there been any documented recent hospitalizations or ED visits since last visit with Clinical Lead?: No  Adherence Review Does the [facility] have access to medication refill data?: Yes Adherence rates for STAR metric medications: atorvastatin 40mg - 04/16/23 100DS, 01/19/23 100DS glipizide 5mg - 04/14/23 100DS, 09/05/22 100DS metformin 500mg - 03/07/26 100DS, 10/23/22 100DS Adherence rates for medications indicated for disease state being reviewed: Entresto 24-26mg - receives samples levothyroxine - 02/06/23 100DS, 09/05/23 100DS metoprolol succinate 50mg - 01/14/23 100DS, 11/05/22 100DS Does the patient have >5 day gap between last estimated fill dates for any of the above medications or other medication gaps?: No  Disease State Questions Able to connect with the Patient?: Yes Did patient have any problems with their health recently?: Yes Note Problems and Concerns:: Gait instability and increased falls. Last fall was about 5 weeks ago but no injuries. Have you had any admissions or emergency room visits or worsening of your condition(s) since last visit?: No Have you had any visits with new specialists or providers since your last visit?: No Have you had any new health care problem(s) since your last visit?: No Have you run out of any of your medications since you last spoke with your clinical lead?: No Are there any medications you are not taking as prescribed?: No Are you having any issues or side effects with your medications?: No Do you have any other health concerns or questions you want to discuss with your Clinical lead  before your next visit?: No Are there any  health concerns that you feel we can do a better job addressing?: No Are you having any problems with any of the following since the last visit: (select all that apply): Ambulating/walking Details: Patient has had increased gait instability. Any falls since last visit?: Yes Details: Patient has had increased gait instability and has seen neurology. Any increased or uncontrolled pain since last visit?: No Next visit Type:: Phone Visit with:: HC Date:: 04/27/2023 Time:: 1:40 Additional Details: Patient states he is doing well and last fall was about 5 weeks ago and states he did not get hurt. Patient stated no issues at this time with his medications and no pain currently.   Engagement Notes Maryellen Pile on 04/27/2023 01:46 PM Reviewing pts chart prior to general review call. Reviewing office visits, consults, hospital visits, lab and medication adherence/changes. Spoke with patient and he is doing well aside from increased gait instability and falls. His last fall was about 5 weeks. He is followed by neurology regarding these issues. Patient states no increased pain and no issues with meds at this time. ( )  Clinical Lead Review Review Adherence gaps identified?: No Drug Therapy Problems identified?: No Assessment: No new needs identified. Follow-up as scheduled  CRN review of call: 5 min

## 2023-05-01 ENCOUNTER — Encounter (HOSPITAL_COMMUNITY): Payer: Self-pay | Admitting: Cardiology

## 2023-05-02 ENCOUNTER — Other Ambulatory Visit (HOSPITAL_COMMUNITY): Payer: Self-pay

## 2023-05-03 ENCOUNTER — Telehealth: Payer: Self-pay | Admitting: Neurology

## 2023-05-03 DIAGNOSIS — N182 Chronic kidney disease, stage 2 (mild): Secondary | ICD-10-CM | POA: Diagnosis not present

## 2023-05-03 DIAGNOSIS — E1122 Type 2 diabetes mellitus with diabetic chronic kidney disease: Secondary | ICD-10-CM | POA: Diagnosis not present

## 2023-05-03 DIAGNOSIS — I480 Paroxysmal atrial fibrillation: Secondary | ICD-10-CM | POA: Diagnosis not present

## 2023-05-03 DIAGNOSIS — I69351 Hemiplegia and hemiparesis following cerebral infarction affecting right dominant side: Secondary | ICD-10-CM | POA: Diagnosis not present

## 2023-05-03 DIAGNOSIS — I700 Atherosclerosis of aorta: Secondary | ICD-10-CM | POA: Diagnosis not present

## 2023-05-03 DIAGNOSIS — E1141 Type 2 diabetes mellitus with diabetic mononeuropathy: Secondary | ICD-10-CM | POA: Diagnosis not present

## 2023-05-03 NOTE — Telephone Encounter (Signed)
New message    Need verbal order for skill nurse to eval & treatment for weeping bilateral lower extremities

## 2023-05-04 NOTE — Telephone Encounter (Signed)
Called Monique and provided verbal orders.  

## 2023-05-04 NOTE — Telephone Encounter (Signed)
OK 

## 2023-05-09 ENCOUNTER — Encounter: Payer: Self-pay | Admitting: Neurology

## 2023-05-09 ENCOUNTER — Encounter: Payer: Self-pay | Admitting: Nurse Practitioner

## 2023-05-10 ENCOUNTER — Other Ambulatory Visit (HOSPITAL_COMMUNITY): Payer: Self-pay

## 2023-05-11 DIAGNOSIS — I480 Paroxysmal atrial fibrillation: Secondary | ICD-10-CM | POA: Diagnosis not present

## 2023-05-11 DIAGNOSIS — E1141 Type 2 diabetes mellitus with diabetic mononeuropathy: Secondary | ICD-10-CM | POA: Diagnosis not present

## 2023-05-11 DIAGNOSIS — E1122 Type 2 diabetes mellitus with diabetic chronic kidney disease: Secondary | ICD-10-CM | POA: Diagnosis not present

## 2023-05-11 DIAGNOSIS — I700 Atherosclerosis of aorta: Secondary | ICD-10-CM | POA: Diagnosis not present

## 2023-05-11 DIAGNOSIS — N182 Chronic kidney disease, stage 2 (mild): Secondary | ICD-10-CM | POA: Diagnosis not present

## 2023-05-11 DIAGNOSIS — I69351 Hemiplegia and hemiparesis following cerebral infarction affecting right dominant side: Secondary | ICD-10-CM | POA: Diagnosis not present

## 2023-05-12 ENCOUNTER — Other Ambulatory Visit (HOSPITAL_COMMUNITY): Payer: Self-pay

## 2023-05-15 ENCOUNTER — Other Ambulatory Visit (HOSPITAL_COMMUNITY): Payer: Self-pay

## 2023-05-15 DIAGNOSIS — I700 Atherosclerosis of aorta: Secondary | ICD-10-CM | POA: Diagnosis not present

## 2023-05-15 DIAGNOSIS — I480 Paroxysmal atrial fibrillation: Secondary | ICD-10-CM | POA: Diagnosis not present

## 2023-05-15 DIAGNOSIS — N182 Chronic kidney disease, stage 2 (mild): Secondary | ICD-10-CM | POA: Diagnosis not present

## 2023-05-15 DIAGNOSIS — I69351 Hemiplegia and hemiparesis following cerebral infarction affecting right dominant side: Secondary | ICD-10-CM | POA: Diagnosis not present

## 2023-05-15 DIAGNOSIS — L97829 Non-pressure chronic ulcer of other part of left lower leg with unspecified severity: Secondary | ICD-10-CM | POA: Diagnosis not present

## 2023-05-15 DIAGNOSIS — E1122 Type 2 diabetes mellitus with diabetic chronic kidney disease: Secondary | ICD-10-CM | POA: Diagnosis not present

## 2023-05-15 DIAGNOSIS — E1141 Type 2 diabetes mellitus with diabetic mononeuropathy: Secondary | ICD-10-CM | POA: Diagnosis not present

## 2023-05-15 DIAGNOSIS — I87312 Chronic venous hypertension (idiopathic) with ulcer of left lower extremity: Secondary | ICD-10-CM | POA: Diagnosis not present

## 2023-05-16 NOTE — Progress Notes (Unsigned)
Assessment and Plan:  There are no diagnoses linked to this encounter.    Further disposition pending results of labs. Discussed med's effects and SE's.   Over 30 minutes of exam, counseling, chart review, and critical decision making was performed.   Future Appointments  Date Time Provider Department Center  05/17/2023  4:00 PM [name], NP [facility] None  06/26/2023 11:00 AM [name], NP [facility] None  07/05/2023  3:30 PM [name], DPM [facility]  08/28/2023  1:30 PM Nita Sickle K, DO [facility] None  09/26/2023  3:00 PM [name], MD [facility] None    ------------------------------------------------------------------------------------------------------------------   HPI There were no vitals taken for this visit. 82 y.o.male presents for  Past Medical History:  Diagnosis Date   Adenomatous colon polyp    Allergy    Anal fissure    Arthritis    BPH (benign prostatic hyperplasia)    Diabetes mellitus (HCC)    GERD (gastroesophageal reflux disease)    Hyperlipidemia    Hypertension    Hypothyroidism    Other testicular hypofunction    Stroke (HCC)      Allergies  Allergen Reactions   Penicillins Other (See Comments)    Did it involve swelling of the face/tongue/throat, SOB, or low BP? Unknown Did it involve sudden or severe rash/hives, skin peeling, or any reaction on the inside of your mouth or nose? Unknown Did you need to seek medical attention at a hospital or doctor's office? No When did it last happen?    Over 66 Years Ago   If all above answers are "NO", may proceed with cephalosporin use.      Current Outpatient Medications on File Prior to Visit  Medication Sig   AMVUTTRA 25 MG/0.5ML syringe Inject into the skin.   apixaban (ELIQUIS) 5 MG TABS tablet TAKE 1 TABLET BY MOUTH  TWICE DAILY TO PREVENT  BLOOD CLOTS   atorvastatin (LIPITOR) 40 MG tablet Take  1 tablet  Daily for Cholesterol                                                        /                                  TAKE                                                       BY                                           MOUTH   baclofen (LIORESAL) 10 MG tablet Start 5mg  (half tablet) at bedtime x 1 week, then increase to half tablet twice daily.   Blood Glucose Monitoring Suppl (ONE TOUCH ULTRA 2) w/Device KIT Check blood sugar 1 time a day-DX-E11.9   Cholecalciferol (VITAMIN D PO) Take 1 tablet by mouth daily.    clotrimazole-betamethasone (LOTRISONE) cream Apply to affected area 2 to 4 x /  day   Cyanocobalamin (B-12) 50 MCG TABS Take 50 mcg by mouth daily.   ferrous sulfate 325 (65 FE) MG tablet Take 325 mg by mouth at bedtime.   finasteride (PROSCAR) 5 MG tablet TAKE 1 TABLET BY MOUTH DAILY FOR PROSTATE   fluconazole (DIFLUCAN) 150 MG tablet Take 1 tablet 2 x /week  if needed for yeast infection   glipiZIDE (GLUCOTROL) 5 MG tablet Patient takes 0.5 tablet by mouth twice a day.   glucose blood (ONETOUCH ULTRA) test strip Use as instructed   Lancets (ONETOUCH DELICA PLUS LANCET30G) MISC CHECK BLOOD SUGAR ONCE A DAY.   Lancets Misc. (ONE TOUCH SURESOFT) MISC Check blood sugar 1 time a day-DX-E11.91   levothyroxine (SYNTHROID) 50 MCG tablet Take  1 tablet  Daily  on an empty stomach with only water for 30 minutes & no Antacid meds, Calcium or Magnesium for 4 hours & avoid Biotin                                          /                              TAKE                      BY                          MOUTH   Magnesium 250 MG TABS Take 250 mg by mouth daily.    metFORMIN (GLUCOPHAGE-XR) 500 MG 24 hr tablet TAKE 2 TABLETS BY MOUTH TWICE  DAILY WITH MEALS FOR DIABETES   metoprolol succinate (TOPROL-XL) 50 MG 24 hr tablet TAKE 1 TABLET BY MOUTH AT  BEDTIME   Multiple Vitamin (MULTIVITAMIN) capsule Take 1 capsule by mouth daily.   NONFORMULARY OR COMPOUNDED ITEM Please dispense a Rolling Walker.  Dx:R26.81 & G63   potassium chloride SA (KLOR-CON M) 20  MEQ tablet Take 1 tablet (20 mEq total) by mouth daily.   sacubitril-valsartan (ENTRESTO) 24-26 MG Take 1 tablet by mouth 2 (two) times daily.   spironolactone (ALDACTONE) 25 MG tablet Take 1 tablet (25 mg total) by mouth at bedtime.   Tafamidis (VYNDAMAX) 61 MG CAPS Take 1 capsule by mouth daily.   torsemide (DEMADEX) 20 MG tablet Take  1 tablet   2 x /day  for Fluid Retention & Leg / Ankle Swelling   triamcinolone cream (KENALOG) 0.1 % Apply 1 Application topically as needed.   trolamine salicylate (ASPERCREME) 10 % cream Apply 1 application topically daily as needed for muscle pain.   VITAMIN A PO Take by mouth. 3000 mg   vitamin C (ASCORBIC ACID) 500 MG tablet Take 500 mg by mouth daily.   No current facility-administered medications on file prior to visit.    ROS: all negative except above.   Physical Exam:  There were no vitals taken for this visit.  General Appearance: Well nourished, in no apparent distress. Eyes: PERRLA, EOMs, conjunctiva no swelling or erythema Sinuses: No Frontal/maxillary tenderness ENT/Mouth: Ext aud canals clear, TMs without erythema, bulging. No erythema, swelling, or exudate on post pharynx.  Tonsils not swollen or erythematous. Hearing normal.  Neck: Supple, thyroid normal.  Respiratory: Respiratory effort normal, BS equal bilaterally without rales, rhonchi, wheezing or stridor.  Cardio: RRR with no MRGs. Brisk peripheral pulses without edema.  Abdomen: Soft, + BS.  Non tender, no guarding, rebound, hernias, masses. Lymphatics: Non tender without lymphadenopathy.  Musculoskeletal: Full ROM, 5/5 strength, normal gait.  Skin: Warm, dry without rashes, lesions, ecchymosis.  Neuro: Cranial nerves intact. Normal muscle tone, no cerebellar symptoms. Sensation intact.  Psych: Awake and oriented X 3, normal affect, Insight and Judgment appropriate.     [name], NP 1:06 PM [facility]

## 2023-05-17 ENCOUNTER — Ambulatory Visit (INDEPENDENT_AMBULATORY_CARE_PROVIDER_SITE_OTHER): Payer: Medicare Other | Admitting: Nurse Practitioner

## 2023-05-17 ENCOUNTER — Encounter: Payer: Self-pay | Admitting: Nurse Practitioner

## 2023-05-17 VITALS — BP 94/56 | HR 73 | Temp 97.5°F | Ht 71.0 in | Wt 159.4 lb

## 2023-05-17 DIAGNOSIS — I5022 Chronic systolic (congestive) heart failure: Secondary | ICD-10-CM

## 2023-05-17 DIAGNOSIS — I6930 Unspecified sequelae of cerebral infarction: Secondary | ICD-10-CM | POA: Diagnosis not present

## 2023-05-17 DIAGNOSIS — B3742 Candidal balanitis: Secondary | ICD-10-CM | POA: Diagnosis not present

## 2023-05-17 DIAGNOSIS — L97211 Non-pressure chronic ulcer of right calf limited to breakdown of skin: Secondary | ICD-10-CM

## 2023-05-17 DIAGNOSIS — N182 Chronic kidney disease, stage 2 (mild): Secondary | ICD-10-CM

## 2023-05-17 DIAGNOSIS — I872 Venous insufficiency (chronic) (peripheral): Secondary | ICD-10-CM

## 2023-05-17 DIAGNOSIS — N481 Balanitis: Secondary | ICD-10-CM

## 2023-05-17 DIAGNOSIS — E1122 Type 2 diabetes mellitus with diabetic chronic kidney disease: Secondary | ICD-10-CM

## 2023-05-17 MED ORDER — CLOTRIMAZOLE-BETAMETHASONE 1-0.05 % EX CREA: TOPICAL_CREAM | CUTANEOUS | 3 refills | Status: DC

## 2023-05-17 NOTE — Patient Instructions (Signed)
Diabetes Mellitus and Skin Care Diabetes, also called diabetes mellitus, can lead to skin problems. If blood sugar (glucose) is not well controlled, it can cause problems over time. These problems include: Damage to nerves. This can affect your ability to feel wounds. This means you may not notice small skin injuries that could lead to bigger problems. This can also decrease the amount that you sweat, causing dry skin. Damage to blood vessels. The lack of blood flow can cause skin to break down. It can also slow healing time, which can lead to infections. Areas of skin that become thick or discolored. Common skin conditions There are certain skin conditions that often affect people with diabetes. These include: Dry skin. Thin skin. The skin on the feet may get thinner, break more easily, and heal more slowly than normal. Skin infections from bacteria. These include: Styes. These are infections near the eyelid. Boils. These are bumps filled with pus. Infected hair follicles. Infections of the skin around the nails. Fungal skin infections. These are most common in areas where skin rubs together, such as in the armpits or under the breasts. Common skin changes Diabetes can also cause the skin to change. You may develop: Dark, velvety markings on your skin. These may appear on your face, neck, armpits, inner thighs, and groin. Red, raised, scar-like tissue that may itch, feel painful, or become a wound. Blisters on your feet, toes, hands, or fingers. Thick, wax-like areas of skin. In most cases, these occur on the hands, forehead, or toes. Brown or red, ring-shaped or half-ring-shaped patches of skin on the ears or fingers. Pea-shaped, yellow bumps that may be itchy and have a red ring around them. This may affect your arms, feet, buttocks, and the top of your hands. Round, discolored patches of tan skin that do not hurt or itch. These may look like age spots. Supplies needed: Mild soap or  gentle skin cleanser. Lotion. How to care for dry, itchy skin Frequent high glucose levels can cause skin to become itchy. Poor blood circulation and skin infections can make dry skin worse. If you have dry, itchy skin: Avoid very hot showers and baths. Use mild soap and gentle skin cleansers. Do not use soap that is perfumed, harsh, or that dries your skin. Moisturizing soaps may help. Put on moisturizing lotion as soon as you finish bathing. Do not scratch dry skin. Scratching can expose skin to infection. If you have a rash or if your skin is very itchy, contact your health care provider. Skin that is red or covered in a rash may be a sign of an allergic reaction. Very itchy skin may mean that you need help to manage your diabetes better. You may also need treatment for an infection. General tips Most skin problems can be prevented or treated easily if caught early. Talk with your health care provider if you have any concerns. General tips include: Check your skin every day for cuts, bruises, redness, blisters, or sores, especially on your feet. If you cannot see the bottom of your feet, use a mirror or ask someone for help. Tell your health care provider if you have any of these injuries and if they are healing slowly. Keep your skin clean and dry. Do not use hot water. Moisturize your skin to prevent chapping. Keep your blood glucose levels within target range. Follow these instructions at home:  Take over-the-counter and prescription medicines only as told by your health care provider. This includes all diabetes medicines   you are taking. Schedule a foot exam with your health care provider once a year. During the exam, the structure and skin of your feet will be checked for problems. Make sure that your health care provider does a visual foot exam at every visit. If you get a skin injury, such as a cut, blister, or sore, check the area every day for signs of infection. Check for: Redness,  swelling, or pain. Fluid or blood. Warmth. Pus or a bad smell. Do not use any products that contain nicotine or tobacco. These products include cigarettes, chewing tobacco, and vaping devices, such as e-cigarettes. If you need help quitting, ask your health care provider. Where to find more information American Diabetes Association: diabetes.org Association of Diabetes Care & Education Specialists: diabeteseducator.org Contact a health care provider if: You get a cut or sore, especially on your feet. You have signs of infection after a skin injury. You have itchy skin that turns red or develops a rash. You have discolored areas of skin. You have places on your skin that change. They may thicken or appear shiny. This information is not intended to replace advice given to you by your health care provider. Make sure you discuss any questions you have with your health care provider. Document Revised: 05/11/2022 Document Reviewed: 05/11/2022 Elsevier Patient Education  2024 [employer].  

## 2023-05-18 DIAGNOSIS — I480 Paroxysmal atrial fibrillation: Secondary | ICD-10-CM | POA: Diagnosis not present

## 2023-05-18 DIAGNOSIS — N182 Chronic kidney disease, stage 2 (mild): Secondary | ICD-10-CM | POA: Diagnosis not present

## 2023-05-18 DIAGNOSIS — I700 Atherosclerosis of aorta: Secondary | ICD-10-CM | POA: Diagnosis not present

## 2023-05-18 DIAGNOSIS — I69351 Hemiplegia and hemiparesis following cerebral infarction affecting right dominant side: Secondary | ICD-10-CM | POA: Diagnosis not present

## 2023-05-18 DIAGNOSIS — E1141 Type 2 diabetes mellitus with diabetic mononeuropathy: Secondary | ICD-10-CM | POA: Diagnosis not present

## 2023-05-18 DIAGNOSIS — E1122 Type 2 diabetes mellitus with diabetic chronic kidney disease: Secondary | ICD-10-CM | POA: Diagnosis not present

## 2023-05-19 ENCOUNTER — Telehealth (HOSPITAL_COMMUNITY): Payer: Self-pay | Admitting: Cardiology

## 2023-05-19 NOTE — Telephone Encounter (Signed)
Faxed correspondence received from [facility]  Multiple attempts have been made to contact pt to schedule an appointment -referral closed

## 2023-05-22 ENCOUNTER — Ambulatory Visit: Payer: Medicare Other | Admitting: Nurse Practitioner

## 2023-05-23 ENCOUNTER — Other Ambulatory Visit: Payer: Self-pay | Admitting: Internal Medicine

## 2023-05-23 DIAGNOSIS — N182 Chronic kidney disease, stage 2 (mild): Secondary | ICD-10-CM | POA: Diagnosis not present

## 2023-05-23 DIAGNOSIS — E039 Hypothyroidism, unspecified: Secondary | ICD-10-CM

## 2023-05-23 DIAGNOSIS — I69351 Hemiplegia and hemiparesis following cerebral infarction affecting right dominant side: Secondary | ICD-10-CM | POA: Diagnosis not present

## 2023-05-23 DIAGNOSIS — I700 Atherosclerosis of aorta: Secondary | ICD-10-CM | POA: Diagnosis not present

## 2023-05-23 DIAGNOSIS — I480 Paroxysmal atrial fibrillation: Secondary | ICD-10-CM | POA: Diagnosis not present

## 2023-05-23 DIAGNOSIS — E1122 Type 2 diabetes mellitus with diabetic chronic kidney disease: Secondary | ICD-10-CM | POA: Diagnosis not present

## 2023-05-23 DIAGNOSIS — E1141 Type 2 diabetes mellitus with diabetic mononeuropathy: Secondary | ICD-10-CM | POA: Diagnosis not present

## 2023-05-24 ENCOUNTER — Encounter: Payer: Self-pay | Admitting: Nurse Practitioner

## 2023-05-24 DIAGNOSIS — I700 Atherosclerosis of aorta: Secondary | ICD-10-CM | POA: Diagnosis not present

## 2023-05-24 DIAGNOSIS — N182 Chronic kidney disease, stage 2 (mild): Secondary | ICD-10-CM | POA: Diagnosis not present

## 2023-05-24 DIAGNOSIS — I69351 Hemiplegia and hemiparesis following cerebral infarction affecting right dominant side: Secondary | ICD-10-CM | POA: Diagnosis not present

## 2023-05-24 DIAGNOSIS — E1141 Type 2 diabetes mellitus with diabetic mononeuropathy: Secondary | ICD-10-CM | POA: Diagnosis not present

## 2023-05-24 DIAGNOSIS — E1122 Type 2 diabetes mellitus with diabetic chronic kidney disease: Secondary | ICD-10-CM | POA: Diagnosis not present

## 2023-05-24 DIAGNOSIS — I480 Paroxysmal atrial fibrillation: Secondary | ICD-10-CM | POA: Diagnosis not present

## 2023-05-26 DIAGNOSIS — I480 Paroxysmal atrial fibrillation: Secondary | ICD-10-CM | POA: Diagnosis not present

## 2023-05-26 DIAGNOSIS — I69351 Hemiplegia and hemiparesis following cerebral infarction affecting right dominant side: Secondary | ICD-10-CM | POA: Diagnosis not present

## 2023-05-26 DIAGNOSIS — E1141 Type 2 diabetes mellitus with diabetic mononeuropathy: Secondary | ICD-10-CM | POA: Diagnosis not present

## 2023-05-26 DIAGNOSIS — N182 Chronic kidney disease, stage 2 (mild): Secondary | ICD-10-CM | POA: Diagnosis not present

## 2023-05-26 DIAGNOSIS — E1122 Type 2 diabetes mellitus with diabetic chronic kidney disease: Secondary | ICD-10-CM | POA: Diagnosis not present

## 2023-05-26 DIAGNOSIS — I700 Atherosclerosis of aorta: Secondary | ICD-10-CM | POA: Diagnosis not present

## 2023-05-29 DIAGNOSIS — N182 Chronic kidney disease, stage 2 (mild): Secondary | ICD-10-CM | POA: Diagnosis not present

## 2023-05-29 DIAGNOSIS — I480 Paroxysmal atrial fibrillation: Secondary | ICD-10-CM | POA: Diagnosis not present

## 2023-05-29 DIAGNOSIS — I700 Atherosclerosis of aorta: Secondary | ICD-10-CM | POA: Diagnosis not present

## 2023-05-29 DIAGNOSIS — E1122 Type 2 diabetes mellitus with diabetic chronic kidney disease: Secondary | ICD-10-CM | POA: Diagnosis not present

## 2023-05-29 DIAGNOSIS — I69351 Hemiplegia and hemiparesis following cerebral infarction affecting right dominant side: Secondary | ICD-10-CM | POA: Diagnosis not present

## 2023-05-29 DIAGNOSIS — E1141 Type 2 diabetes mellitus with diabetic mononeuropathy: Secondary | ICD-10-CM | POA: Diagnosis not present

## 2023-05-30 DIAGNOSIS — E1141 Type 2 diabetes mellitus with diabetic mononeuropathy: Secondary | ICD-10-CM | POA: Diagnosis not present

## 2023-05-30 DIAGNOSIS — I69351 Hemiplegia and hemiparesis following cerebral infarction affecting right dominant side: Secondary | ICD-10-CM | POA: Diagnosis not present

## 2023-05-30 DIAGNOSIS — E1122 Type 2 diabetes mellitus with diabetic chronic kidney disease: Secondary | ICD-10-CM | POA: Diagnosis not present

## 2023-05-30 DIAGNOSIS — N182 Chronic kidney disease, stage 2 (mild): Secondary | ICD-10-CM | POA: Diagnosis not present

## 2023-05-30 DIAGNOSIS — I480 Paroxysmal atrial fibrillation: Secondary | ICD-10-CM | POA: Diagnosis not present

## 2023-05-30 DIAGNOSIS — I700 Atherosclerosis of aorta: Secondary | ICD-10-CM | POA: Diagnosis not present

## 2023-06-02 DIAGNOSIS — I480 Paroxysmal atrial fibrillation: Secondary | ICD-10-CM | POA: Diagnosis not present

## 2023-06-02 DIAGNOSIS — E1141 Type 2 diabetes mellitus with diabetic mononeuropathy: Secondary | ICD-10-CM | POA: Diagnosis not present

## 2023-06-02 DIAGNOSIS — I69351 Hemiplegia and hemiparesis following cerebral infarction affecting right dominant side: Secondary | ICD-10-CM | POA: Diagnosis not present

## 2023-06-02 DIAGNOSIS — E1122 Type 2 diabetes mellitus with diabetic chronic kidney disease: Secondary | ICD-10-CM | POA: Diagnosis not present

## 2023-06-02 DIAGNOSIS — N182 Chronic kidney disease, stage 2 (mild): Secondary | ICD-10-CM | POA: Diagnosis not present

## 2023-06-02 DIAGNOSIS — I87312 Chronic venous hypertension (idiopathic) with ulcer of left lower extremity: Secondary | ICD-10-CM | POA: Diagnosis not present

## 2023-06-02 DIAGNOSIS — I700 Atherosclerosis of aorta: Secondary | ICD-10-CM | POA: Diagnosis not present

## 2023-06-02 DIAGNOSIS — L97829 Non-pressure chronic ulcer of other part of left lower leg with unspecified severity: Secondary | ICD-10-CM | POA: Diagnosis not present

## 2023-06-05 DIAGNOSIS — I69351 Hemiplegia and hemiparesis following cerebral infarction affecting right dominant side: Secondary | ICD-10-CM | POA: Diagnosis not present

## 2023-06-05 DIAGNOSIS — N182 Chronic kidney disease, stage 2 (mild): Secondary | ICD-10-CM | POA: Diagnosis not present

## 2023-06-05 DIAGNOSIS — I700 Atherosclerosis of aorta: Secondary | ICD-10-CM | POA: Diagnosis not present

## 2023-06-05 DIAGNOSIS — I480 Paroxysmal atrial fibrillation: Secondary | ICD-10-CM | POA: Diagnosis not present

## 2023-06-05 DIAGNOSIS — E1141 Type 2 diabetes mellitus with diabetic mononeuropathy: Secondary | ICD-10-CM | POA: Diagnosis not present

## 2023-06-05 DIAGNOSIS — E1122 Type 2 diabetes mellitus with diabetic chronic kidney disease: Secondary | ICD-10-CM | POA: Diagnosis not present

## 2023-06-06 DIAGNOSIS — I480 Paroxysmal atrial fibrillation: Secondary | ICD-10-CM | POA: Diagnosis not present

## 2023-06-06 DIAGNOSIS — N182 Chronic kidney disease, stage 2 (mild): Secondary | ICD-10-CM | POA: Diagnosis not present

## 2023-06-06 DIAGNOSIS — E1141 Type 2 diabetes mellitus with diabetic mononeuropathy: Secondary | ICD-10-CM | POA: Diagnosis not present

## 2023-06-06 DIAGNOSIS — I69351 Hemiplegia and hemiparesis following cerebral infarction affecting right dominant side: Secondary | ICD-10-CM | POA: Diagnosis not present

## 2023-06-06 DIAGNOSIS — E1122 Type 2 diabetes mellitus with diabetic chronic kidney disease: Secondary | ICD-10-CM | POA: Diagnosis not present

## 2023-06-06 DIAGNOSIS — I700 Atherosclerosis of aorta: Secondary | ICD-10-CM | POA: Diagnosis not present

## 2023-06-07 ENCOUNTER — Other Ambulatory Visit (HOSPITAL_COMMUNITY): Payer: Self-pay

## 2023-06-08 DIAGNOSIS — I87312 Chronic venous hypertension (idiopathic) with ulcer of left lower extremity: Secondary | ICD-10-CM | POA: Diagnosis not present

## 2023-06-08 DIAGNOSIS — S81802A Unspecified open wound, left lower leg, initial encounter: Secondary | ICD-10-CM | POA: Diagnosis not present

## 2023-06-08 DIAGNOSIS — N182 Chronic kidney disease, stage 2 (mild): Secondary | ICD-10-CM | POA: Diagnosis not present

## 2023-06-08 DIAGNOSIS — I480 Paroxysmal atrial fibrillation: Secondary | ICD-10-CM | POA: Diagnosis not present

## 2023-06-08 DIAGNOSIS — E1141 Type 2 diabetes mellitus with diabetic mononeuropathy: Secondary | ICD-10-CM | POA: Diagnosis not present

## 2023-06-08 DIAGNOSIS — I700 Atherosclerosis of aorta: Secondary | ICD-10-CM | POA: Diagnosis not present

## 2023-06-08 DIAGNOSIS — I69351 Hemiplegia and hemiparesis following cerebral infarction affecting right dominant side: Secondary | ICD-10-CM | POA: Diagnosis not present

## 2023-06-08 DIAGNOSIS — E1122 Type 2 diabetes mellitus with diabetic chronic kidney disease: Secondary | ICD-10-CM | POA: Diagnosis not present

## 2023-06-09 ENCOUNTER — Other Ambulatory Visit (HOSPITAL_COMMUNITY): Payer: Self-pay

## 2023-06-11 DIAGNOSIS — L97829 Non-pressure chronic ulcer of other part of left lower leg with unspecified severity: Secondary | ICD-10-CM | POA: Diagnosis not present

## 2023-06-11 DIAGNOSIS — I87312 Chronic venous hypertension (idiopathic) with ulcer of left lower extremity: Secondary | ICD-10-CM | POA: Diagnosis not present

## 2023-06-12 DIAGNOSIS — E1141 Type 2 diabetes mellitus with diabetic mononeuropathy: Secondary | ICD-10-CM | POA: Diagnosis not present

## 2023-06-12 DIAGNOSIS — I700 Atherosclerosis of aorta: Secondary | ICD-10-CM | POA: Diagnosis not present

## 2023-06-12 DIAGNOSIS — E1122 Type 2 diabetes mellitus with diabetic chronic kidney disease: Secondary | ICD-10-CM | POA: Diagnosis not present

## 2023-06-12 DIAGNOSIS — I69351 Hemiplegia and hemiparesis following cerebral infarction affecting right dominant side: Secondary | ICD-10-CM | POA: Diagnosis not present

## 2023-06-12 DIAGNOSIS — I480 Paroxysmal atrial fibrillation: Secondary | ICD-10-CM | POA: Diagnosis not present

## 2023-06-12 DIAGNOSIS — N182 Chronic kidney disease, stage 2 (mild): Secondary | ICD-10-CM | POA: Diagnosis not present

## 2023-06-15 DIAGNOSIS — N182 Chronic kidney disease, stage 2 (mild): Secondary | ICD-10-CM | POA: Diagnosis not present

## 2023-06-15 DIAGNOSIS — E1141 Type 2 diabetes mellitus with diabetic mononeuropathy: Secondary | ICD-10-CM | POA: Diagnosis not present

## 2023-06-15 DIAGNOSIS — I480 Paroxysmal atrial fibrillation: Secondary | ICD-10-CM | POA: Diagnosis not present

## 2023-06-15 DIAGNOSIS — I700 Atherosclerosis of aorta: Secondary | ICD-10-CM | POA: Diagnosis not present

## 2023-06-15 DIAGNOSIS — I69351 Hemiplegia and hemiparesis following cerebral infarction affecting right dominant side: Secondary | ICD-10-CM | POA: Diagnosis not present

## 2023-06-15 DIAGNOSIS — E1122 Type 2 diabetes mellitus with diabetic chronic kidney disease: Secondary | ICD-10-CM | POA: Diagnosis not present

## 2023-06-19 ENCOUNTER — Encounter: Payer: Self-pay | Admitting: Neurology

## 2023-06-19 DIAGNOSIS — R292 Abnormal reflex: Secondary | ICD-10-CM

## 2023-06-19 DIAGNOSIS — E854 Organ-limited amyloidosis: Secondary | ICD-10-CM

## 2023-06-19 DIAGNOSIS — G63 Polyneuropathy in diseases classified elsewhere: Secondary | ICD-10-CM

## 2023-06-19 DIAGNOSIS — Z8673 Personal history of transient ischemic attack (TIA), and cerebral infarction without residual deficits: Secondary | ICD-10-CM

## 2023-06-19 DIAGNOSIS — R2681 Unsteadiness on feet: Secondary | ICD-10-CM

## 2023-06-20 DIAGNOSIS — I480 Paroxysmal atrial fibrillation: Secondary | ICD-10-CM | POA: Diagnosis not present

## 2023-06-20 DIAGNOSIS — I700 Atherosclerosis of aorta: Secondary | ICD-10-CM | POA: Diagnosis not present

## 2023-06-20 DIAGNOSIS — E1122 Type 2 diabetes mellitus with diabetic chronic kidney disease: Secondary | ICD-10-CM | POA: Diagnosis not present

## 2023-06-20 DIAGNOSIS — N182 Chronic kidney disease, stage 2 (mild): Secondary | ICD-10-CM | POA: Diagnosis not present

## 2023-06-20 DIAGNOSIS — I69351 Hemiplegia and hemiparesis following cerebral infarction affecting right dominant side: Secondary | ICD-10-CM | POA: Diagnosis not present

## 2023-06-20 DIAGNOSIS — E1141 Type 2 diabetes mellitus with diabetic mononeuropathy: Secondary | ICD-10-CM | POA: Diagnosis not present

## 2023-06-22 DIAGNOSIS — N182 Chronic kidney disease, stage 2 (mild): Secondary | ICD-10-CM | POA: Diagnosis not present

## 2023-06-22 DIAGNOSIS — I69351 Hemiplegia and hemiparesis following cerebral infarction affecting right dominant side: Secondary | ICD-10-CM | POA: Diagnosis not present

## 2023-06-22 DIAGNOSIS — I700 Atherosclerosis of aorta: Secondary | ICD-10-CM | POA: Diagnosis not present

## 2023-06-22 DIAGNOSIS — E1122 Type 2 diabetes mellitus with diabetic chronic kidney disease: Secondary | ICD-10-CM | POA: Diagnosis not present

## 2023-06-22 DIAGNOSIS — E1141 Type 2 diabetes mellitus with diabetic mononeuropathy: Secondary | ICD-10-CM | POA: Diagnosis not present

## 2023-06-22 DIAGNOSIS — I480 Paroxysmal atrial fibrillation: Secondary | ICD-10-CM | POA: Diagnosis not present

## 2023-06-26 ENCOUNTER — Ambulatory Visit: Payer: Medicare Other | Admitting: Nurse Practitioner

## 2023-06-26 DIAGNOSIS — I69351 Hemiplegia and hemiparesis following cerebral infarction affecting right dominant side: Secondary | ICD-10-CM | POA: Diagnosis not present

## 2023-06-26 DIAGNOSIS — E1141 Type 2 diabetes mellitus with diabetic mononeuropathy: Secondary | ICD-10-CM | POA: Diagnosis not present

## 2023-06-26 DIAGNOSIS — E1122 Type 2 diabetes mellitus with diabetic chronic kidney disease: Secondary | ICD-10-CM | POA: Diagnosis not present

## 2023-06-26 DIAGNOSIS — N182 Chronic kidney disease, stage 2 (mild): Secondary | ICD-10-CM | POA: Diagnosis not present

## 2023-06-26 DIAGNOSIS — I700 Atherosclerosis of aorta: Secondary | ICD-10-CM | POA: Diagnosis not present

## 2023-06-26 DIAGNOSIS — I480 Paroxysmal atrial fibrillation: Secondary | ICD-10-CM | POA: Diagnosis not present

## 2023-06-27 ENCOUNTER — Other Ambulatory Visit: Payer: Self-pay

## 2023-06-27 ENCOUNTER — Telehealth: Payer: Self-pay

## 2023-06-27 DIAGNOSIS — Z8673 Personal history of transient ischemic attack (TIA), and cerebral infarction without residual deficits: Secondary | ICD-10-CM

## 2023-06-27 DIAGNOSIS — R2681 Unsteadiness on feet: Secondary | ICD-10-CM

## 2023-06-27 DIAGNOSIS — E854 Organ-limited amyloidosis: Secondary | ICD-10-CM

## 2023-06-27 DIAGNOSIS — R292 Abnormal reflex: Secondary | ICD-10-CM

## 2023-06-27 DIAGNOSIS — G63 Polyneuropathy in diseases classified elsewhere: Secondary | ICD-10-CM

## 2023-06-27 NOTE — Progress Notes (Signed)
ERROR

## 2023-06-27 NOTE — Telephone Encounter (Signed)
ERROR

## 2023-06-28 ENCOUNTER — Ambulatory Visit: Payer: Medicare Other | Admitting: Internal Medicine

## 2023-07-04 DIAGNOSIS — N182 Chronic kidney disease, stage 2 (mild): Secondary | ICD-10-CM | POA: Diagnosis not present

## 2023-07-04 DIAGNOSIS — E1141 Type 2 diabetes mellitus with diabetic mononeuropathy: Secondary | ICD-10-CM | POA: Diagnosis not present

## 2023-07-04 DIAGNOSIS — L97821 Non-pressure chronic ulcer of other part of left lower leg limited to breakdown of skin: Secondary | ICD-10-CM | POA: Diagnosis not present

## 2023-07-04 DIAGNOSIS — I69351 Hemiplegia and hemiparesis following cerebral infarction affecting right dominant side: Secondary | ICD-10-CM | POA: Diagnosis not present

## 2023-07-04 DIAGNOSIS — E1122 Type 2 diabetes mellitus with diabetic chronic kidney disease: Secondary | ICD-10-CM | POA: Diagnosis not present

## 2023-07-04 DIAGNOSIS — I872 Venous insufficiency (chronic) (peripheral): Secondary | ICD-10-CM | POA: Diagnosis not present

## 2023-07-05 ENCOUNTER — Other Ambulatory Visit (HOSPITAL_COMMUNITY): Payer: Self-pay

## 2023-07-05 ENCOUNTER — Encounter: Payer: Self-pay | Admitting: Podiatry

## 2023-07-05 ENCOUNTER — Ambulatory Visit (INDEPENDENT_AMBULATORY_CARE_PROVIDER_SITE_OTHER): Payer: Medicare Other | Admitting: Podiatry

## 2023-07-05 DIAGNOSIS — L84 Corns and callosities: Secondary | ICD-10-CM | POA: Diagnosis not present

## 2023-07-05 DIAGNOSIS — E1142 Type 2 diabetes mellitus with diabetic polyneuropathy: Secondary | ICD-10-CM | POA: Diagnosis not present

## 2023-07-05 DIAGNOSIS — M79676 Pain in unspecified toe(s): Secondary | ICD-10-CM

## 2023-07-05 DIAGNOSIS — B351 Tinea unguium: Secondary | ICD-10-CM

## 2023-07-06 DIAGNOSIS — L97821 Non-pressure chronic ulcer of other part of left lower leg limited to breakdown of skin: Secondary | ICD-10-CM | POA: Diagnosis not present

## 2023-07-06 DIAGNOSIS — I69351 Hemiplegia and hemiparesis following cerebral infarction affecting right dominant side: Secondary | ICD-10-CM | POA: Diagnosis not present

## 2023-07-06 DIAGNOSIS — E1141 Type 2 diabetes mellitus with diabetic mononeuropathy: Secondary | ICD-10-CM | POA: Diagnosis not present

## 2023-07-06 DIAGNOSIS — N182 Chronic kidney disease, stage 2 (mild): Secondary | ICD-10-CM | POA: Diagnosis not present

## 2023-07-06 DIAGNOSIS — I872 Venous insufficiency (chronic) (peripheral): Secondary | ICD-10-CM | POA: Diagnosis not present

## 2023-07-06 DIAGNOSIS — E1122 Type 2 diabetes mellitus with diabetic chronic kidney disease: Secondary | ICD-10-CM | POA: Diagnosis not present

## 2023-07-10 ENCOUNTER — Other Ambulatory Visit (HOSPITAL_COMMUNITY): Payer: Self-pay

## 2023-07-10 DIAGNOSIS — E1122 Type 2 diabetes mellitus with diabetic chronic kidney disease: Secondary | ICD-10-CM | POA: Diagnosis not present

## 2023-07-10 DIAGNOSIS — I69351 Hemiplegia and hemiparesis following cerebral infarction affecting right dominant side: Secondary | ICD-10-CM | POA: Diagnosis not present

## 2023-07-10 DIAGNOSIS — E1141 Type 2 diabetes mellitus with diabetic mononeuropathy: Secondary | ICD-10-CM | POA: Diagnosis not present

## 2023-07-10 DIAGNOSIS — I872 Venous insufficiency (chronic) (peripheral): Secondary | ICD-10-CM | POA: Diagnosis not present

## 2023-07-10 DIAGNOSIS — N182 Chronic kidney disease, stage 2 (mild): Secondary | ICD-10-CM | POA: Diagnosis not present

## 2023-07-10 DIAGNOSIS — L97821 Non-pressure chronic ulcer of other part of left lower leg limited to breakdown of skin: Secondary | ICD-10-CM | POA: Diagnosis not present

## 2023-07-10 NOTE — Progress Notes (Addendum)
  Subjective:  Patient ID: Earl [name], male    DOB: 01/04/1941,  MRN: [id number]  Earl [name] presents to clinic today for at risk foot care with history of diabetic neuropathy and preulcerative lesion(s) left great toe and painful mycotic toenails that limit ambulation. Painful toenails interfere with ambulation. Aggravating factors include wearing enclosed shoe gear. Pain is relieved with periodic professional debridement. Painful preulcerative lesion(s) is/are aggravated when weightbearing with and without shoegear. Pain is relieved with periodic professional debridement.   New problem(s): [facility] venous stasis ulceration RLE.  PCP is [name], MD.  Allergies  Allergen Reactions   Penicillins Other (See Comments)    Did it involve swelling of the face/tongue/throat, SOB, or low BP? Unknown Did it involve sudden or severe rash/hives, skin peeling, or any reaction on the inside of your mouth or nose? Unknown Did you need to seek medical attention at a hospital or doctor's office? No When did it last happen?    Over 40 Years Ago   If all above answers are "NO", may proceed with cephalosporin use.     Review of Systems: Negative except as noted in the HPI.  Objective: No changes noted in today's physical examination. There were no vitals filed for this visit. Earl [name] is a pleasant 82 y.o. male WD, WN in NAD. AAO x 3.  Neurovascular Examination: Capillary refill time to digits immediate b/l. Palpable pedal pulses b/l LE. Pedal hair sparse. No pain with calf compression b/l. Lower extremity skin temperature gradient within normal limits. No edema noted b/l LE. No ischemia or gangrene noted b/l LE. No cyanosis or clubbing noted b/l LE.  He is wearing open toed compression hose today.  Protective sensation intact 5/5 intact bilaterally with 10g monofilament b/l. Vibratory sensation intact b/l.  Dermatological:   Dressing noted RLE which is clean, dry and intact. Pedal skin is warm and supple b/l LE.  No interdigital macerations noted b/l LE.   Toenails 1-5 bilaterally elongated, discolored, dystrophic, thickened, and crumbly with subungual debris and tenderness to dorsal palpation.   Preulcerative lesion noted distal tip of left hallux with subdermal hemorrhage No erythema, no edema, no drainage, no fluctuance.  Musculoskeletal:  Muscle strength 5/5 to all lower extremity muscle groups bilaterally. No pain, crepitus or joint limitation noted with ROM bilateral LE.  Assessment/Plan: 1. Pain due to onychomycosis of toenail   2. Pre-ulcerative calluses   3. Diabetic polyneuropathy associated with type 2 diabetes mellitus (HCC)     -Patient's family member present. All questions/concerns addressed on today's visit. -Patient to continue soft, supportive shoe gear daily. -Toenails 1-5 b/l were debrided in length and girth with sterile nail nippers and dremel without iatrogenic bleeding.  -Preulcerative lesion pared left great toe utilizing sterile scalpel blade. Total number pared=1. -Patient/POA to call should there be question/concern in the interim.   Return in about 3 months (around 10/05/2023).  [name], DPM

## 2023-07-11 NOTE — Progress Notes (Deleted)
MEDICARE ANNUAL WELLNESS VISIT AND FOLLOW UP Assessment:   Encounter for Medicare annual wellness exam Due Annually   Paroxysmal atrial fibrillation (HCC) Continue medications- Eliquis 5 mg BID, Metoprolol 50 mg every day  Rate controlled, follow up cardio  Essential hypertension Controlled  Continue medications: Metoprolol 50 mg every day, Entresto 24/26 1 tab BID, Spironolactone 25 mg QHS Discussed DASH (Dietary Approaches to Stop Hypertension) DASH diet is lower in sodium than a typical American diet. Cut back on foods that are high in saturated fat, cholesterol, and trans fats. Eat more whole-grain foods, fish, poultry, and nuts Remain active and exercise as tolerated daily.  Monitor BP at home-Call if greater than 130/80.    Abdominal aortic atherosclerosis (HCC) by Abd CTscvn  2016 Control blood pressure, cholesterol, glucose, increase exercise.   Acute on chronic combined systolic and diastolic congestive heart failure (HCC) Continue medications; Metoprolol 50 mg every day, Entresto 24/26 1 tab BID, Spironolactone 25 mg at bedtime, Vyndamax 61 mg every day  Discussed DASH (Dietary Approaches to Stop Hypertension) DASH diet is lower in sodium than a typical American diet. Cut back on foods that are high in saturated fat, cholesterol, and trans fats. Eat more whole-grain foods, fish, poultry, and nuts Remain active and exercise as tolerated daily.  Monitor BP at home-Call if greater than 130/80.   Hyperlipidemia associated with type 2 diabetes mellitus (HCC) Controlled Continue medications;  Discussed lifestyle modifications. Recommended diet heavy in fruits and veggies, omega 3's. Decrease consumption of animal meats, cheeses, and dairy products. Remain active and exercise as tolerated. Continue to monitor.   CKD stage 2 due to type 2 diabetes mellitus (HCC) Discussed how what you eat and drink can aide in kidney protection. Stay well hydrated. Avoid high salt  foods. Avoid NSAIDS. Keep BP and BG well controlled.   Take medications as prescribed. Remain active and exercise as tolerated daily. Maintain weight.  Continue to monitor.  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  Perform daily foot/skin check, notify office of any concerning changes.  Check A1C   Benign prostatic hyperplasia with urinary frequency Well managed. Continue to monitor  Diabetic Mononeuropathy associated with Type 2 Diabetes Mellitus Continue Amvuttra 25 mg   Vitamin D deficiency Controlled.  Continue to monitor  Venous stasis ulcer of right calf limited to breakdown of skin(HCC) Following with wound clinic  Late effect of cerebrovascular accident (CVA) Care is now coming tues thrus fri - home instead is what they are using. Son has set up in home care that patient feels is private and is requesting for  insurance to assess [facility] RN evaluation.  MGUS (monoclonal gammopathy of unknown significance) Hem/onc follows Continue to monitor   Medication management All medications discussed and reviewed in full. All questions and concerns regarding medications addressed.     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, no falls    Over 30 minutes of exam, counseling, chart review, and critical decision making was performed  Future Appointments  Date Time Provider Department Center  07/12/2023 11:00 AM [name], NP [facility] None  08/28/2023  1:30 PM Nita Sickle K, DO [facility] None  09/26/2023  3:00 PM [name], MD [facility] None  10/09/2023  1:15 PM [name], DPM [facility]  01/10/2024  3:45  PM [name], DPM [facility]  07/11/2024 11:00 AM [name], NP [facility] None     Plan:   During the  course of the visit the patient was educated and counseled about appropriate screening and preventive services including:   Pneumococcal vaccine  Influenza vaccine Prevnar 13 Td vaccine Screening electrocardiogram Colorectal cancer screening Diabetes screening Glaucoma screening Nutrition counseling    Subjective:  Earl [name] is a 82 y.o. male who presents with daughter caretaker for Medicare Annual Wellness Visit and 3 month follow up for HTN, hyperlipidemia DMII, CKD, atrial fibrillation, BPH, and vitamin D Def.    Wife [name] passed earlier this year, had been married 55 years. His daughter [name] stays with him twice a week, Son also checks in on him.  Daughter is also involved in care.  Each child takes turns caring for him.  He is doing better and stopped short term xanax.  He ambulates with cane.   Son has rearranged an in home private care taker to which patient would like to re-assess through insurance for payment.  He reports thickened deviated toe nails and neuropathic pains in feet; bil but L is bothering him more. He was referred to Podiatry and is currently having wound care to the toe.  Reports healing and feeling much better.  Able to walk more steadily.  BMI is There is no height or weight on file to calculate BMI., he is working on diet and exercise[facility] 1.5 hours 3 days a week.  Wt Readings from Last 3 Encounters:  05/17/23 159 lb 6.4 oz (72.3 kg)  04/25/23 161 lb (73 kg)  03/21/23 150 lb (68 kg)   He also follows with Dr Clelia Croft for MGUS.  He also has history of CVA in 2018, Rt cerebellar thrombotic and placed on Eliquis for Afib, follows with Dr. Graciela Husbands.  Last ECHO 2018 with moderate LVH, EF 55-60%. He uses a cane for gait stability for right ataxia sp CVA. He did follow with speeth therapy with sigificant improvement, reports dysphagia has improved/resolved.    His blood pressure has been controlled at home, today their BP is   He does  workout. He denies chest pain, shortness of breath, dizziness.   Per recent cardiology note losartan 25 mg was suggested for diabetic renal protection. He was previously on lisinopril low dose 5 mg, however this was stopped due to hypotension/dizziness at home, reports improved sx. Reviewed 10 year renal labs, appear has been fairly stable without significant trend down. His diabetes has been very well controlled.   He has abdominal aortic atherosclerosis per CT 2016.   He is on cholesterol medication and denies myalgias. His cholesterol is at goal. The cholesterol last visit was:   Lab Results  Component Value Date   CHOL 108 03/21/2023   HDL 53 03/21/2023   LDLCALC 42 03/21/2023   TRIG 46 03/21/2023   CHOLHDL 2.0 03/21/2023   He has not been working on diet and exercise for T2 diabetes, on metformin and glipizide 5 mg BID, on eliquis, statin and denies hyperglycemia, hypoglycemia , nausea, polydipsia, and polyuria. He does report burning in bil feet at night, does take gabapentin 600 mg at HS for this. Last A1C in the office was:  Lab Results  Component Value Date   HGBA1C 6.3 (H) 03/21/2023    He has CKD II associated with T2DM monitored at this office. Not on ACEi due to hypotension. Last GFR:  Lab Results  Component Value Date   GFRAA 77 05/17/2021   Patient is on Vitamin D supplement.   Lab Results  Component Value Date   VD25OH 58 03/21/2023     He is on thyroid medication. His medication was not changed last visit. 50 mcg, takes 1 tab Sun/Tues/Thus, 1/2 tab all others.  Lab Results  Component Value Date   TSH 0.27 (L) 03/21/2023  .     Medication Review:  Current Outpatient Medications (Endocrine & Metabolic):    glipiZIDE (GLUCOTROL) 5 MG tablet, Patient takes 0.5 tablet by mouth twice a day.   levothyroxine (SYNTHROID) 50 MCG tablet, TAKE 1 TABLET BY MOUTH DAILY ON  AN EMPTY STOMACH WITH ONLY WATER FOR 30 MINUTES AND NO ANTACID,  MEDS, CALCIUM OR MAGNESIUM FOR 4  HOURS AND AVOID BIOTIN   metFORMIN (GLUCOPHAGE-XR) 500 MG 24 hr tablet, TAKE 2 TABLETS BY MOUTH TWICE  DAILY WITH MEALS FOR DIABETES  Current Outpatient Medications (Cardiovascular):    atorvastatin (LIPITOR) 40 MG tablet, Take  1 tablet  Daily for Cholesterol                                                       /                                  TAKE                                                       BY                                           MOUTH   metoprolol succinate (TOPROL-XL) 50 MG 24 hr tablet, TAKE 1 TABLET BY MOUTH AT  BEDTIME   sacubitril-valsartan (ENTRESTO) 24-26 MG, Take 1 tablet by mouth 2 (two) times daily.   spironolactone (ALDACTONE) 25 MG tablet, Take 1 tablet (25 mg total) by mouth at bedtime.   Tafamidis (VYNDAMAX) 61 MG CAPS, Take 1 capsule by mouth daily.   torsemide (DEMADEX) 20 MG tablet, Take  1 tablet   2 x /day  for Fluid Retention & Leg / Ankle Swelling    Current Outpatient Medications (Hematological):    apixaban (ELIQUIS) 5 MG TABS tablet, TAKE 1 TABLET BY MOUTH  TWICE DAILY TO PREVENT  BLOOD CLOTS   Cyanocobalamin (B-12) 50 MCG TABS, Take 50 mcg by mouth daily.   ferrous sulfate 325 (65 FE) MG tablet, Take 325 mg by mouth at bedtime.  Current Outpatient Medications (Other):    AMVUTTRA 25 MG/0.5ML syringe, Inject into the skin.   baclofen (LIORESAL) 10 MG tablet, Start 5mg  (half tablet) at bedtime x 1 week, then increase to half tablet twice daily. (Patient not taking: Reported on 05/17/2023)   Blood Glucose Monitoring Suppl (ONE TOUCH ULTRA 2) w/Device KIT, Check blood sugar 1 time a day-DX-E11.9   Cholecalciferol (VITAMIN D PO), Take 1 tablet by mouth daily.    clotrimazole-betamethasone (LOTRISONE) cream, Apply  to affected area 2 to 4 x / day   finasteride (PROSCAR) 5 MG tablet, TAKE 1 TABLET BY MOUTH DAILY FOR PROSTATE   glucose blood (ONETOUCH ULTRA) test strip, Use as instructed   Lancets (ONETOUCH DELICA PLUS LANCET30G) MISC, CHECK BLOOD SUGAR  ONCE A DAY.   Lancets Misc. (ONE TOUCH SURESOFT) MISC, Check blood sugar 1 time a day-DX-E11.91   Magnesium 250 MG TABS, Take 250 mg by mouth daily.    Multiple Vitamin (MULTIVITAMIN) capsule, Take 1 capsule by mouth daily.   NONFORMULARY OR COMPOUNDED ITEM, Please dispense a Rolling Walker.  Dx:R26.81 & G63   potassium chloride SA (KLOR-CON M) 20 MEQ tablet, Take 1 tablet (20 mEq total) by mouth daily.   triamcinolone cream (KENALOG) 0.1 %, Apply 1 Application topically as needed.   trolamine salicylate (ASPERCREME) 10 % cream, Apply 1 application topically daily as needed for muscle pain.   VITAMIN A PO, Take by mouth. 3000 mg   vitamin C (ASCORBIC ACID) 500 MG tablet, Take 500 mg by mouth daily.  Allergies: Allergies  Allergen Reactions   Penicillins Other (See Comments)    Did it involve swelling of the face/tongue/throat, SOB, or low BP? Unknown Did it involve sudden or severe rash/hives, skin peeling, or any reaction on the inside of your mouth or nose? Unknown Did you need to seek medical attention at a hospital or doctor's office? No When did it last happen?    Over 37 Years Ago   If all above answers are "NO", may proceed with cephalosporin use.      Current Problems (verified) has Paroxysmal atrial fibrillation (HCC); MGUS (monoclonal gammopathy of unknown significance); Essential hypertension; Hyperlipidemia associated with type 2 diabetes mellitus (HCC); Type 2 diabetes mellitus with stage 2 chronic kidney disease, without long-term current use of insulin (HCC); BPH (benign prostatic hyperplasia); Vitamin D deficiency; Hypothyroidism; History of CVA with residual deficit; Ataxia, post-stroke; Chronic anticoagulation; CKD stage 2 due to type 2 diabetes mellitus (HCC); Late effect of cerebrovascular accident (CVA); Acquired thrombophilia (HCC); Abdominal aortic atherosclerosis (HCC) by Abd CTscvn  2016; Neurologic gait disorder; Diabetic mononeuropathy associated with type 2  diabetes mellitus (HCC); Iron deficiency anemia; Acute on chronic congestive heart failure (HCC); NICM (nonischemic cardiomyopathy) (HCC); and Chronic systolic heart failure (HCC) on their problem list.  Screening Tests Immunization History  Administered Date(s) Administered   DT (Pediatric) 02/09/2015   Influenza Split 08/07/2013   Influenza, High Dose Seasonal PF 08/19/2014, 08/28/2015, 09/13/2016, 08/15/2017, 08/15/2018, 09/06/2019, 10/19/2020, 09/12/2022   PFIZER Comirnaty(Gray Top)Covid-19 Tri-Sucrose Vaccine 08/28/2022   PFIZER(Purple Top)SARS-COV-2 Vaccination 12/11/2019, 01/01/2020, 02/24/2021   Pneumococcal Conjugate-13 06/10/2016   Pneumococcal Polysaccharide-23 08/13/2008   Td 11/22/2003   Zoster Recombinant(Shingrix) 05/27/2021    Preventative care: Last colonoscopy: 04/2019, DONE EGD 04/2019  Echo/TEE 2018 EF 55%, MR MRI brain 2018 + stroke CXR 12/2019 CT head 2020 AB [place] 12/2019 fatty liver  Prior vaccinations: TD or Tdap: 2016  Influenza: 2021 Pneumococcal: 2009 Prevnar13: 2017 Shingrix: reports had first dose, pending completion  Covid 19: 2/2 + 2 boosters - missing 3rd booster date, record requesting  Names of Other Physician/Practitioners you currently use: 1. [facility] here for primary care 2. Diabetic Eye Exam: Dr. Manning Charity, no retinopathy 10/08/2021 3. Dentist:  [employer], last 2023, goes q54m, has scheduled in July, has upper partial   Patient Care Team: [name], MD as PCP - General (Internal Medicine) Duke Salvia, MD as PCP - Cardiology (Cardiology)  Duke Salvia, MD as PCP - Electrophysiology (Cardiology) Laurey Morale, MD as PCP - Advanced Heart Failure (Cardiology) Louis Meckel, MD (Inactive) as Consulting Physician (Gastroenterology) Benjiman Core, MD (Inactive) as Consulting Physician (Oncology) Elise Benne, MD as Consulting Physician (Ophthalmology) Charlett Nose, [facility]  (Inactive) as Pharmacist (Pharmacist) [name], DO as Consulting Physician (Neurology) Carmelina Dane, [facility] (Inactive) as Pharmacist (Pharmacist)  Surgical: He  has a past surgical history that includes Inguinal hernia repair (Bilateral); TEE without cardioversion (N/A, 07/28/2017); LOOP RECORDER INSERTION (N/A, 07/28/2017); Colonoscopy; Circumcision (N/A, 06/11/2019); and RIGHT/LEFT HEART CATH AND CORONARY ANGIOGRAPHY (N/A, 10/07/2021). Family His family history includes Colon cancer in his father; Diabetes in his father; Sickle cell anemia in his mother; Stomach cancer in his father. Social history  He reports that he has never smoked. He has never used smokeless tobacco. He reports that he does not drink alcohol and does not use drugs.  MEDICARE WELLNESS OBJECTIVES: Physical activity:   Cardiac risk factors:   Depression/mood screen:      03/21/2023    5:25 PM  Depression screen PHQ 2/9  Decreased Interest 0  Down, Depressed, Hopeless 0  PHQ - 2 Score 0    ADLs:     03/21/2023    5:26 PM 12/14/2022    4:23 PM  In your present state of health, do you have any difficulty performing the following activities:  Hearing? 0 0  Vision? 0 0  Difficulty concentrating or making decisions? 0 0  Walking or climbing stairs? 0 1  Dressing or bathing? 0 0  Doing errands, shopping? 0 1      Cognitive Testing  Alert? Yes  Normal Appearance?Yes  Oriented to person? Yes  Place? Yes   Time? Yes  Recall of three objects?  1/3  Can perform simple calculations? Yes  Displays appropriate judgment?Yes  Can read the correct time from a watch face?Yes  EOL planning:    Objective:   There were no vitals filed for this visit.   There is no height or weight on file to calculate BMI.  General Appearance: Well nourished, well dressed AA elder male, in no apparent distress. Eyes: PERRLA, EOMs, conjunctiva no swelling or erythema Sinuses: No Frontal/maxillary tenderness ENT/Mouth: Ext aud  canals clear, TMs without erythema, bulging. No erythema, swelling, or exudate on post pharynx.  Tonsils not swollen or erythematous. Upper partials. Hearing normal.  Neck: Supple, thyroid normal.  Respiratory: Respiratory effort normal, BS equal bilaterally without rales, rhonchi, wheezing or stridor.  Cardio: RRR with no MRGs. Brisk peripheral pulses without edema.  Abdomen: Soft, + BS.  Non tender, no guarding, rebound, hernias, masses. Lymphatics: Non tender without lymphadenopathy.  Musculoskeletal: Full ROM, 5/5 strength, slow gait with cane Skin: Warm, dry without rashes, lesions, ecchymosis. Left 1st and second toenails thickened and deviated. Callouses Neuro: Cranial nerves intact. No cerebellar symptoms. Sensation intact to monofilament in bil feet.  Psych: Awake and oriented X 3, intermittently tearful affect, Insight and Judgment appropriate.   Medicare Attestation I have personally reviewed: The patient's medical and social history Their use of alcohol, tobacco or illicit drugs Their current medications and supplements The patient's functional ability including ADLs,fall risks, home safety risks, cognitive, and hearing and visual impairment Diet and physical activities Evidence for depression or mood disorders  The patient's weight, height, BMI, and visual acuity have been recorded in the chart.  I have made referrals, counseling, and provided education to the patient based on review of the  above and I have provided the patient with a written personalized care plan for preventive services.    Future Appointments  Date Time Provider Department Center  07/12/2023 11:00 AM [name], NP [facility] None  08/28/2023  1:30 PM Nita Sickle K, DO [facility] None  09/26/2023  3:00 PM [name], MD [facility] None  10/09/2023  1:15 PM [name], DPM [facility]  01/10/2024  3:45 PM [name], DPM [facility]  07/11/2024 11:00 AM  [name], NP [facility] None    [name], NP 12:25 PM [facility]

## 2023-07-12 ENCOUNTER — Ambulatory Visit: Payer: Medicare Other | Admitting: Nurse Practitioner

## 2023-07-12 DIAGNOSIS — E1169 Type 2 diabetes mellitus with other specified complication: Secondary | ICD-10-CM

## 2023-07-12 DIAGNOSIS — E039 Hypothyroidism, unspecified: Secondary | ICD-10-CM

## 2023-07-12 DIAGNOSIS — Z79899 Other long term (current) drug therapy: Secondary | ICD-10-CM

## 2023-07-12 DIAGNOSIS — E559 Vitamin D deficiency, unspecified: Secondary | ICD-10-CM

## 2023-07-12 DIAGNOSIS — I872 Venous insufficiency (chronic) (peripheral): Secondary | ICD-10-CM

## 2023-07-12 DIAGNOSIS — N138 Other obstructive and reflux uropathy: Secondary | ICD-10-CM

## 2023-07-12 DIAGNOSIS — Z0000 Encounter for general adult medical examination without abnormal findings: Secondary | ICD-10-CM

## 2023-07-12 DIAGNOSIS — E1122 Type 2 diabetes mellitus with diabetic chronic kidney disease: Secondary | ICD-10-CM

## 2023-07-12 DIAGNOSIS — I10 Essential (primary) hypertension: Secondary | ICD-10-CM

## 2023-07-12 DIAGNOSIS — D472 Monoclonal gammopathy: Secondary | ICD-10-CM

## 2023-07-12 DIAGNOSIS — I69393 Ataxia following cerebral infarction: Secondary | ICD-10-CM

## 2023-07-12 DIAGNOSIS — I5042 Chronic combined systolic (congestive) and diastolic (congestive) heart failure: Secondary | ICD-10-CM

## 2023-07-12 DIAGNOSIS — I700 Atherosclerosis of aorta: Secondary | ICD-10-CM

## 2023-07-12 DIAGNOSIS — R269 Unspecified abnormalities of gait and mobility: Secondary | ICD-10-CM

## 2023-07-12 DIAGNOSIS — I480 Paroxysmal atrial fibrillation: Secondary | ICD-10-CM

## 2023-07-12 DIAGNOSIS — E1141 Type 2 diabetes mellitus with diabetic mononeuropathy: Secondary | ICD-10-CM

## 2023-07-12 DIAGNOSIS — I6930 Unspecified sequelae of cerebral infarction: Secondary | ICD-10-CM

## 2023-07-13 DIAGNOSIS — E1122 Type 2 diabetes mellitus with diabetic chronic kidney disease: Secondary | ICD-10-CM | POA: Diagnosis not present

## 2023-07-13 DIAGNOSIS — L97821 Non-pressure chronic ulcer of other part of left lower leg limited to breakdown of skin: Secondary | ICD-10-CM | POA: Diagnosis not present

## 2023-07-13 DIAGNOSIS — I69351 Hemiplegia and hemiparesis following cerebral infarction affecting right dominant side: Secondary | ICD-10-CM | POA: Diagnosis not present

## 2023-07-13 DIAGNOSIS — N182 Chronic kidney disease, stage 2 (mild): Secondary | ICD-10-CM | POA: Diagnosis not present

## 2023-07-13 DIAGNOSIS — I872 Venous insufficiency (chronic) (peripheral): Secondary | ICD-10-CM | POA: Diagnosis not present

## 2023-07-13 DIAGNOSIS — E1141 Type 2 diabetes mellitus with diabetic mononeuropathy: Secondary | ICD-10-CM | POA: Diagnosis not present

## 2023-07-18 DIAGNOSIS — I872 Venous insufficiency (chronic) (peripheral): Secondary | ICD-10-CM | POA: Diagnosis not present

## 2023-07-18 DIAGNOSIS — E1141 Type 2 diabetes mellitus with diabetic mononeuropathy: Secondary | ICD-10-CM | POA: Diagnosis not present

## 2023-07-18 DIAGNOSIS — N182 Chronic kidney disease, stage 2 (mild): Secondary | ICD-10-CM | POA: Diagnosis not present

## 2023-07-18 DIAGNOSIS — E1122 Type 2 diabetes mellitus with diabetic chronic kidney disease: Secondary | ICD-10-CM | POA: Diagnosis not present

## 2023-07-18 DIAGNOSIS — L97821 Non-pressure chronic ulcer of other part of left lower leg limited to breakdown of skin: Secondary | ICD-10-CM | POA: Diagnosis not present

## 2023-07-18 DIAGNOSIS — I69351 Hemiplegia and hemiparesis following cerebral infarction affecting right dominant side: Secondary | ICD-10-CM | POA: Diagnosis not present

## 2023-07-18 NOTE — Progress Notes (Unsigned)
MEDICARE ANNUAL WELLNESS VISIT AND FOLLOW UP Assessment:   Encounter for Medicare annual wellness exam Due Annually   Paroxysmal atrial fibrillation (HCC) Continue medications- Eliquis 5 mg BID, Metoprolol 50 mg every day  Rate controlled, follow up cardio  Essential hypertension Controlled  Continue medications: Metoprolol 50 mg every day, Entresto 24/26 1 tab BID, Spironolactone 25 mg QHS Discussed DASH (Dietary Approaches to Stop Hypertension) DASH diet is lower in sodium than a typical American diet. Cut back on foods that are high in saturated fat, cholesterol, and trans fats. Eat more whole-grain foods, [name], poultry, and nuts Remain active and exercise as tolerated daily.  Monitor BP at home-Call if greater than 130/80.    Abdominal aortic atherosclerosis (HCC) by Abd CTscvn  2016 Control blood pressure, cholesterol, glucose, increase exercise.   Acute on chronic combined systolic and diastolic congestive heart failure (HCC) Continue medications; Metoprolol 50 mg every day, Entresto 24/26 1 tab BID, Spironolactone 25 mg at bedtime, Vyndamax 61 mg every day  Discussed DASH (Dietary Approaches to Stop Hypertension) DASH diet is lower in sodium than a typical American diet. Cut back on foods that are high in saturated fat, cholesterol, and trans fats. Eat more whole-grain foods, [name], poultry, and nuts Remain active and exercise as tolerated daily.  Monitor BP at home-Call if greater than 130/80.   Hyperlipidemia associated with type 2 diabetes mellitus (HCC) Controlled Continue medications;  Discussed lifestyle modifications. Recommended diet heavy in fruits and veggies, omega 3's. Decrease consumption of animal meats, cheeses, and dairy products. Remain active and exercise as tolerated. Continue to monitor.   CKD stage 2 due to type 2 diabetes mellitus (HCC) Discussed how what you eat and drink can aide in kidney protection. Stay well hydrated. Avoid high salt  foods. Avoid NSAIDS. Keep BP and BG well controlled.   Take medications as prescribed. Remain active and exercise as tolerated daily. Maintain weight.  Continue to monitor.  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  Perform daily foot/skin check, notify office of any concerning changes.  Check A1C   Benign prostatic hyperplasia with urinary frequency Well managed. Continue to monitor  Diabetic Mononeuropathy associated with Type 2 Diabetes Mellitus Continue Amvuttra 25 mg q 3 months  Vitamin D deficiency Controlled.  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 All questions and concerns regarding medications addressed.     Neurologic gait disorder Continue to walk with a walker, no falls  Ataxia, post-stroke Continue to walk with a walker, no falls    Over 30 minutes of exam, counseling, chart review, and critical decision making was performed  Future Appointments  Date Time Provider Department Center  08/28/2023  1:30 PM Nita Sickle K, DO [facility] None  09/26/2023  3:00 PM [name], MD [facility] None  10/09/2023  1:15 PM [name], DPM [facility]  01/10/2024  3:45 PM [name], DPM [facility]  07/18/2024 11:00  AM [name], NP [facility] None     Plan:   During the course of the visit the  patient was educated and counseled about appropriate screening and preventive services including:   Pneumococcal vaccine  Influenza vaccine Prevnar 13 Td vaccine Screening electrocardiogram Colorectal cancer screening Diabetes screening Glaucoma screening Nutrition counseling    Subjective:  Earl [name] is a 82 y.o. male who presents with daughter caretaker for Medicare Annual Wellness Visit and 3 month follow up for HTN, hyperlipidemia DMII, CKD, atrial fibrillation, BPH, and vitamin D Def.    Wife [name] passed earlier this year, had been married 55 years. His daughter [name] stays with him twice a week, Son also checks in on him.  Daughter is also involved in care.  Each child takes turns caring for him.   He ambulates with walker  Son has rearranged an in home private care taker nurse aid T-W-F and nurse comes twice a week to dress venous stasis ulcer on left ankle.   He reports thickened deviated toe nails and neuropathic pains in feet. He sees podiatry every three months.     BMI is Body mass index is 23.1 kg/m., he is working on diet and exercise, he is using his walker, unable to get much exercise.  Pt has stopped.  Wt Readings from Last 3 Encounters:  07/19/23 165 lb 9.6 oz (75.1 kg)  05/17/23 159 lb 6.4 oz (72.3 kg)  04/25/23 161 lb (73 kg)   He also follows with Dr Clelia Croft for MGUS.  He also has history of CVA in 2018, Rt cerebellar thrombotic and placed on Eliquis for Afib, follows with Dr. Graciela Husbands.  Last ECHO 2018 with moderate LVH, EF 55-60%. He uses a walker for gait stability for right ataxia s/p CVA. He did follow with speeth therapy with sigificant improvement, reports dysphagia has improved/resolved.    His blood pressure has been controlled at home, today their BP is BP: (!) 112/56  BP Readings from Last 3 Encounters:  07/19/23 (!) 112/56  05/17/23 (!) 94/56  04/25/23 (!) 103/55  He does workout. He denies chest pain, shortness of breath, dizziness.    Per recent cardiology note losartan 25 mg was suggested for diabetic renal protection. He was previously on lisinopril low dose 5 mg, however this was stopped due to hypotension/dizziness at home, reports improved sx. Reviewed 10 year renal labs, appear has been fairly stable without significant trend down. His diabetes has been very well controlled.   He has abdominal aortic atherosclerosis per CT 2016.   He is on cholesterol medication, atorvastatin 40 mg every day  and denies myalgias. His cholesterol is at goal. The cholesterol last visit was:   Lab Results  Component Value Date   CHOL 108 03/21/2023   HDL 53 03/21/2023   LDLCALC 42 03/21/2023   TRIG 46 03/21/2023   CHOLHDL 2.0 03/21/2023   He has not been working on diet and exercise for T2 diabetes, on metformin 500 mg 2 tabs BID and glipizide 5 mg 1/2 tab twice a week, on eliquis, statin and denies hyperglycemia, hypoglycemia , nausea, polydipsia, and polyuria. He does report burning in bil feet at night, does take gabapentin 600 mg at HS for this. Last A1C in the office was:  Lab Results  Component Value Date   HGBA1C 6.3 (H) 03/21/2023    He has CKD II associated with T2DM monitored at this office. Not on ACEi due to hypotension. Last GFR:  Lab Results  Component Value Date   EGFR 79 03/21/2023    Patient is on Vitamin D supplement.   Lab Results  Component Value Date   VD25OH 45 03/21/2023     He is on thyroid medication. His medication was not changed last visit. 50 mcg, takes 1 tab Sun , Tues, Thursday and Mon , Wed, Fri , Sat 1/2 tab  Lab Results  Component Value Date   TSH 0.27 (L) 03/21/2023  .  He is using Amvuttra once q 3 months and has improved neuropathy of feet . Off Gabapentin.    Medication Review:  Current Outpatient Medications (Endocrine & Metabolic):    glipiZIDE (GLUCOTROL) 5 MG tablet, Patient takes 0.5 tablet by mouth twice a day.   levothyroxine (SYNTHROID) 50 MCG tablet, TAKE 1 TABLET BY  MOUTH DAILY ON  AN EMPTY STOMACH WITH ONLY WATER FOR 30 MINUTES AND NO ANTACID,  MEDS, CALCIUM OR MAGNESIUM FOR 4 HOURS AND AVOID BIOTIN   metFORMIN (GLUCOPHAGE-XR) 500 MG 24 hr tablet, TAKE 2 TABLETS BY MOUTH TWICE  DAILY WITH MEALS FOR DIABETES  Current Outpatient Medications (Cardiovascular):    atorvastatin (LIPITOR) 40 MG tablet, Take  1 tablet  Daily for Cholesterol                                                       /                                  TAKE                                                       BY                                           MOUTH   metoprolol succinate (TOPROL-XL) 50 MG 24 hr tablet, TAKE 1 TABLET BY MOUTH AT  BEDTIME   sacubitril-valsartan (ENTRESTO) 24-26 MG, Take 1 tablet by mouth 2 (two) times daily.   spironolactone (ALDACTONE) 25 MG tablet, Take 1 tablet (25 mg total) by mouth at bedtime.   Tafamidis (VYNDAMAX) 61 MG CAPS, Take 1 capsule by mouth daily.   torsemide (DEMADEX) 20 MG tablet, Take  1 tablet   2 x /day  for Fluid Retention & Leg / Ankle Swelling    Current Outpatient Medications (Hematological):    apixaban (ELIQUIS) 5 MG TABS tablet, TAKE 1 TABLET BY MOUTH  TWICE DAILY TO PREVENT  BLOOD CLOTS   Cyanocobalamin (B-12) 50 MCG TABS, Take 50 mcg by mouth daily.   ferrous sulfate 325 (65 FE) MG tablet, Take 325 mg by mouth at bedtime.  Current Outpatient Medications (Other):    AMVUTTRA 25 MG/0.5ML syringe, Inject into the skin.   Blood Glucose Monitoring Suppl (ONE TOUCH ULTRA 2) w/Device KIT, Check blood sugar 1 time a day-DX-E11.9   Cholecalciferol (VITAMIN D PO), Take 1 tablet by mouth daily.    clotrimazole-betamethasone (LOTRISONE) cream, Apply to affected area 2 to 4  x / day   finasteride (PROSCAR) 5 MG tablet, TAKE 1 TABLET BY MOUTH DAILY FOR PROSTATE   glucose blood (ONETOUCH ULTRA) test strip, Use as instructed   Lancets (ONETOUCH DELICA PLUS LANCET30G) MISC, CHECK BLOOD SUGAR ONCE A DAY.   Lancets Misc. (ONE TOUCH SURESOFT) MISC,  Check blood sugar 1 time a day-DX-E11.91   Magnesium 250 MG TABS, Take 250 mg by mouth daily.    Multiple Vitamin (MULTIVITAMIN) capsule, Take 1 capsule by mouth daily.   NONFORMULARY OR COMPOUNDED ITEM, Please dispense a Rolling Walker.  Dx:R26.81 & G63   potassium chloride SA (KLOR-CON M) 20 MEQ tablet, Take 1 tablet (20 mEq total) by mouth daily.   triamcinolone cream (KENALOG) 0.1 %, Apply 1 Application topically as needed.   trolamine salicylate (ASPERCREME) 10 % cream, Apply 1 application topically daily as needed for muscle pain.   VITAMIN A PO, Take by mouth. 3000 mg   vitamin C (ASCORBIC ACID) 500 MG tablet, Take 500 mg by mouth daily.  Allergies: Allergies  Allergen Reactions   Penicillins Other (See Comments)    Did it involve swelling of the face/tongue/throat, SOB, or low BP? Unknown Did it involve sudden or severe rash/hives, skin peeling, or any reaction on the inside of your mouth or nose? Unknown Did you need to seek medical attention at a hospital or doctor's office? No When did it last happen?    Over 62 Years Ago   If all above answers are "NO", may proceed with cephalosporin use.      Current Problems (verified) has Paroxysmal atrial fibrillation (HCC); MGUS (monoclonal gammopathy of unknown significance); Essential hypertension; Hyperlipidemia associated with type 2 diabetes mellitus (HCC); Type 2 diabetes mellitus with stage 2 chronic kidney disease, without long-term current use of insulin (HCC); BPH (benign prostatic hyperplasia); Vitamin D deficiency; Hypothyroidism; History of CVA with residual deficit; Ataxia, post-stroke; Chronic anticoagulation; CKD stage 2 due to type 2 diabetes mellitus (HCC); Late effect of cerebrovascular accident (CVA); Acquired thrombophilia (HCC); Abdominal aortic atherosclerosis (HCC) by Abd CTscvn  2016; Neurologic gait disorder; Diabetic 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 (HCC); Iron deficiency anemia; Acute on  chronic congestive heart failure (HCC); NICM (nonischemic cardiomyopathy) (HCC); and Chronic systolic heart failure (HCC) on their problem list.  Screening Tests Immunization History  Administered Date(s) Administered   DT (Pediatric) 02/09/2015   Influenza Split 08/07/2013   Influenza, High Dose Seasonal PF 08/19/2014, 08/28/2015, 09/13/2016, 08/15/2017, 08/15/2018, 09/06/2019, 10/19/2020, 09/12/2022   PFIZER Comirnaty(Gray Top)Covid-19 Tri-Sucrose Vaccine 08/28/2022   PFIZER(Purple Top)SARS-COV-2 Vaccination 12/11/2019, 01/01/2020, 02/24/2021   Pneumococcal Conjugate-13 06/10/2016   Pneumococcal Polysaccharide-23 08/13/2008   Td 11/22/2003   Zoster Recombinant(Shingrix) 05/27/2021   Health Maintenance  Topic Date Due   COVID-19 Vaccine (5 - 2023-24 season) 08/04/2023 (Originally 10/23/2022)   INFLUENZA VACCINE  09/18/2023 (Originally 06/22/2023)   Zoster Vaccines- Shingrix (2 of 2) 10/19/2023 (Originally 07/22/2021)   FOOT EXAM  09/12/2023   Diabetic kidney evaluation - Urine ACR  09/13/2023   HEMOGLOBIN A1C  09/20/2023   OPHTHALMOLOGY EXAM  10/13/2023   Diabetic kidney evaluation - eGFR measurement  03/20/2024   Medicare Annual Wellness (AWV)  07/18/2024   DTaP/Tdap/Td (3 - Tdap) 02/08/2025   Pneumonia Vaccine 47+ Years old  Completed   HPV VACCINES  Aged Out   Colonoscopy  Discontinued   Hepatitis C Screening  Discontinued     Names of Other Physician/Practitioners you currently use: 1. [facility] here for primary care  2. Diabetic Eye Exam: Dr. Manning Charity, no retinopathy 09/2022 3. Dentist:  Pharmacist, hospital, has dentures  Patient Care Team: [name], MD as PCP - General (Internal Medicine) Duke Salvia, MD as PCP - Cardiology (Cardiology) Duke Salvia, MD as PCP - Electrophysiology (Cardiology) Laurey Morale, MD as PCP - Advanced Heart Failure (Cardiology) Louis Meckel, MD (Inactive) as Consulting Physician  (Gastroenterology) Benjiman Core, MD (Inactive) as Consulting Physician (Oncology) Elise Benne, MD as Consulting Physician (Ophthalmology) Charlett Nose, [facility] (Inactive) as Pharmacist (Pharmacist) [name], DO as Consulting Physician (Neurology) Carmelina Dane, [facility] (Inactive) as Pharmacist (Pharmacist)  Surgical: He  has a past surgical history that includes Inguinal hernia repair (Bilateral); TEE without cardioversion (N/A, 07/28/2017); LOOP RECORDER INSERTION (N/A, 07/28/2017); Colonoscopy; Circumcision (N/A, 06/11/2019); and RIGHT/LEFT HEART CATH AND CORONARY ANGIOGRAPHY (N/A, 10/07/2021). Family His family history includes Colon cancer in his father; Diabetes in his father; Sickle cell anemia in his mother; Stomach cancer in his father. Social history  He reports that he has never smoked. He has never used smokeless tobacco. He reports that he does not drink alcohol and does not use drugs.  MEDICARE WELLNESS OBJECTIVES: Physical activity:   Cardiac risk factors: Cardiac Risk Factors include: advanced age (>1men, >81 women);diabetes mellitus;dyslipidemia;hypertension;male gender;sedentary lifestyle Depression/mood screen:      07/19/2023   11:59 AM  Depression screen PHQ 2/9  Decreased Interest 0  Down, Depressed, Hopeless 0  PHQ - 2 Score 0    ADLs:     07/19/2023   11:55 AM 03/21/2023    5:26 PM  In your present state of health, do you have any difficulty performing the following activities:  Hearing? 1 0  Vision? 0 0  Difficulty concentrating or making decisions? 1 0  Walking or climbing stairs? 1 0  Dressing or bathing? 1 0  Doing errands, shopping? 1 0      Cognitive Testing  Alert? Yes  Normal Appearance?Yes  Oriented to person? Yes  Place? Yes   Time? Yes  Recall of three objects?  3/3  Can perform simple calculations? Yes  Displays appropriate judgment?Yes  Can read the correct time from a watch face?Yes  EOL planning: Does Patient Have a Medical  Advance Directive?: Yes Type of Advance Directive: Living will, Healthcare Power of Attorney Does patient want to make changes to medical advance directive?: No - Patient declined Copy of Healthcare Power of Attorney in Chart?: No - copy requested  Objective:   Today's Vitals   07/19/23 1120  BP: (!) 112/56  Pulse: 61  Temp: 97.8 F (36.6 C)  SpO2: 99%  Weight: 165 lb 9.6 oz (75.1 kg)  Height: 5\' 11"  (1.803 m)     Body mass index is 23.1 kg/m.  General Appearance: Well nourished, well dressed AA elder male, in no apparent distress. Eyes: PERRLA, EOMs, conjunctiva no swelling or erythema Sinuses: No Frontal/maxillary tenderness ENT/Mouth: Ext aud canals clear, TMs without erythema, bulging. No erythema, swelling, or exudate on post pharynx. Upper partials. Hearing normal.  Neck: Supple, thyroid normal.  Respiratory: Respiratory effort normal, BS equal bilaterally without rales, rhonchi, wheezing or stridor.  Cardio: RRR with no MRGs. Brisk peripheral pulses without edema.  Abdomen: Soft, + BS.  Non tender, no guarding, rebound, hernias, masses. Lymphatics: Non tender without lymphadenopathy.  Musculoskeletal: Full ROM, 54/5 strength of lower extremities, 5/5 of upper extremities, slow antalgic gait with walker. Skin: Warm, dry without rashes, lesions, ecchymosis. Callouses. Toenails thickened. Left medial ankle  venous ulcer is < 1 cm and involves only top layer. Kissing lesions of buttocks that are discolored but has not broken skin. Penis has healed no candidiasis Neuro: Cranial nerves intact. No cerebellar symptoms. Sensation intact to monofilament in bil feet.  Psych: Awake and oriented X 3, normal affect, Insight and Judgment appropriate.   Medicare Attestation I have personally reviewed: The patient's medical and social history Their use of alcohol, tobacco or illicit drugs Their current medications and supplements The patient's functional ability including ADLs,fall  risks, home safety risks, cognitive, and hearing and visual impairment Diet and physical activities Evidence for depression or mood disorders  The patient's weight, height, BMI, and visual acuity have been recorded in the chart.  I have made referrals, counseling, and provided education to the patient based on review of the above and I have provided the patient with a written personalized care plan for preventive services.      [name], NP 12:01 PM [facility]

## 2023-07-19 ENCOUNTER — Ambulatory Visit (INDEPENDENT_AMBULATORY_CARE_PROVIDER_SITE_OTHER): Payer: Medicare Other | Admitting: Nurse Practitioner

## 2023-07-19 ENCOUNTER — Encounter (HOSPITAL_COMMUNITY): Payer: Self-pay | Admitting: Cardiology

## 2023-07-19 ENCOUNTER — Encounter: Payer: Self-pay | Admitting: Nurse Practitioner

## 2023-07-19 VITALS — BP 112/56 | HR 61 | Temp 97.8°F | Ht 71.0 in | Wt 165.6 lb

## 2023-07-19 DIAGNOSIS — E785 Hyperlipidemia, unspecified: Secondary | ICD-10-CM | POA: Diagnosis not present

## 2023-07-19 DIAGNOSIS — E1169 Type 2 diabetes mellitus with other specified complication: Secondary | ICD-10-CM

## 2023-07-19 DIAGNOSIS — E559 Vitamin D deficiency, unspecified: Secondary | ICD-10-CM | POA: Diagnosis not present

## 2023-07-19 DIAGNOSIS — Z7901 Long term (current) use of anticoagulants: Secondary | ICD-10-CM

## 2023-07-19 DIAGNOSIS — E039 Hypothyroidism, unspecified: Secondary | ICD-10-CM

## 2023-07-19 DIAGNOSIS — I700 Atherosclerosis of aorta: Secondary | ICD-10-CM

## 2023-07-19 DIAGNOSIS — E1141 Type 2 diabetes mellitus with diabetic mononeuropathy: Secondary | ICD-10-CM | POA: Diagnosis not present

## 2023-07-19 DIAGNOSIS — I69393 Ataxia following cerebral infarction: Secondary | ICD-10-CM

## 2023-07-19 DIAGNOSIS — R6889 Other general symptoms and signs: Secondary | ICD-10-CM

## 2023-07-19 DIAGNOSIS — D472 Monoclonal gammopathy: Secondary | ICD-10-CM

## 2023-07-19 DIAGNOSIS — R269 Unspecified abnormalities of gait and mobility: Secondary | ICD-10-CM

## 2023-07-19 DIAGNOSIS — I6930 Unspecified sequelae of cerebral infarction: Secondary | ICD-10-CM

## 2023-07-19 DIAGNOSIS — N182 Chronic kidney disease, stage 2 (mild): Secondary | ICD-10-CM

## 2023-07-19 DIAGNOSIS — E1122 Type 2 diabetes mellitus with diabetic chronic kidney disease: Secondary | ICD-10-CM | POA: Diagnosis not present

## 2023-07-19 DIAGNOSIS — I5043 Acute on chronic combined systolic (congestive) and diastolic (congestive) heart failure: Secondary | ICD-10-CM | POA: Diagnosis not present

## 2023-07-19 DIAGNOSIS — I10 Essential (primary) hypertension: Secondary | ICD-10-CM

## 2023-07-19 DIAGNOSIS — Z0001 Encounter for general adult medical examination with abnormal findings: Secondary | ICD-10-CM

## 2023-07-19 DIAGNOSIS — Z79899 Other long term (current) drug therapy: Secondary | ICD-10-CM

## 2023-07-19 DIAGNOSIS — L97321 Non-pressure chronic ulcer of left ankle limited to breakdown of skin: Secondary | ICD-10-CM

## 2023-07-19 DIAGNOSIS — I480 Paroxysmal atrial fibrillation: Secondary | ICD-10-CM

## 2023-07-19 DIAGNOSIS — Z0000 Encounter for general adult medical examination without abnormal findings: Secondary | ICD-10-CM

## 2023-07-19 DIAGNOSIS — I872 Venous insufficiency (chronic) (peripheral): Secondary | ICD-10-CM

## 2023-07-19 DIAGNOSIS — L989 Disorder of the skin and subcutaneous tissue, unspecified: Secondary | ICD-10-CM

## 2023-07-19 DIAGNOSIS — I87312 Chronic venous hypertension (idiopathic) with ulcer of left lower extremity: Secondary | ICD-10-CM | POA: Diagnosis not present

## 2023-07-19 DIAGNOSIS — S81802A Unspecified open wound, left lower leg, initial encounter: Secondary | ICD-10-CM | POA: Diagnosis not present

## 2023-07-19 MED ORDER — AQUAPHOR ADV PROTECT HEALING EX OINT
TOPICAL_OINTMENT | CUTANEOUS | 1 refills | Status: DC
Start: 2023-07-19 — End: 2024-08-23

## 2023-07-19 NOTE — Patient Instructions (Signed)
Use Aquaphor barrier cream to lesions on buttocks to prevent breakdown  GENERAL HEALTH GOALS   Know what a healthy weight is for you (roughly BMI <25) and aim to maintain this   Aim for 7+ servings of fruits and vegetables daily   70-80+ fluid ounces of water or unsweet tea for healthy kidneys   Limit to max 1 drink of alcohol per day; avoid smoking/tobacco   Limit animal fats in diet for cholesterol and heart health - choose grass fed whenever available   Avoid highly processed foods, and foods high in saturated/trans fats   Aim for low stress - take time to unwind and care for your mental health   Aim for 150 min of moderate intensity exercise weekly for heart health, and weights twice weekly for bone health   Aim for 7-9 hours of sleep daily

## 2023-07-20 DIAGNOSIS — E1122 Type 2 diabetes mellitus with diabetic chronic kidney disease: Secondary | ICD-10-CM | POA: Diagnosis not present

## 2023-07-20 DIAGNOSIS — L97821 Non-pressure chronic ulcer of other part of left lower leg limited to breakdown of skin: Secondary | ICD-10-CM | POA: Diagnosis not present

## 2023-07-20 DIAGNOSIS — E1141 Type 2 diabetes mellitus with diabetic mononeuropathy: Secondary | ICD-10-CM | POA: Diagnosis not present

## 2023-07-20 DIAGNOSIS — N182 Chronic kidney disease, stage 2 (mild): Secondary | ICD-10-CM | POA: Diagnosis not present

## 2023-07-20 DIAGNOSIS — I69351 Hemiplegia and hemiparesis following cerebral infarction affecting right dominant side: Secondary | ICD-10-CM | POA: Diagnosis not present

## 2023-07-20 DIAGNOSIS — I872 Venous insufficiency (chronic) (peripheral): Secondary | ICD-10-CM | POA: Diagnosis not present

## 2023-07-20 LAB — COMPLETE METABOLIC PANEL WITH GFR
AG Ratio: 1.9 (calc) (ref 1.0–2.5)
ALT: 23 U/L (ref 9–46)
AST: 21 U/L (ref 10–35)
Albumin: 3.7 g/dL (ref 3.6–5.1)
Alkaline phosphatase (APISO): 57 U/L (ref 35–144)
BUN: 25 mg/dL (ref 7–25)
CO2: 25 mmol/L (ref 20–32)
Calcium: 9 mg/dL (ref 8.6–10.3)
Chloride: 107 mmol/L (ref 98–110)
Creat: 1.06 mg/dL (ref 0.70–1.22)
Globulin: 2 g/dL (ref 1.9–3.7)
Glucose, Bld: 147 mg/dL — ABNORMAL HIGH (ref 65–99)
Potassium: 4.6 mmol/L (ref 3.5–5.3)
Sodium: 141 mmol/L (ref 135–146)
Total Bilirubin: 0.4 mg/dL (ref 0.2–1.2)
Total Protein: 5.7 g/dL — ABNORMAL LOW (ref 6.1–8.1)
eGFR: 70 mL/min/{1.73_m2} (ref >=60)

## 2023-07-20 LAB — CBC WITH DIFFERENTIAL/PLATELET
Absolute Monocytes: 561 {cells}/uL (ref 200–950)
Basophils Absolute: 41 {cells}/uL (ref 0–200)
Basophils Relative: 0.8 %
Eosinophils Absolute: 82 {cells}/uL (ref 15–500)
Eosinophils Relative: 1.6 %
HCT: 34.7 % — ABNORMAL LOW (ref 38.5–50.0)
Hemoglobin: 11.6 g/dL — ABNORMAL LOW (ref 13.2–17.1)
Lymphs Abs: 1566 {cells}/uL (ref 850–3900)
MCH: 30.1 pg (ref 27.0–33.0)
MCHC: 33.4 g/dL (ref 32.0–36.0)
MCV: 90.1 fL (ref 80.0–100.0)
MPV: 10.5 fL (ref 7.5–12.5)
Monocytes Relative: 11 %
Neutro Abs: 2851 {cells}/uL (ref 1500–7800)
Neutrophils Relative %: 55.9 %
Platelets: 226 10*3/uL (ref 140–400)
RBC: 3.85 10*6/uL — ABNORMAL LOW (ref 4.20–5.80)
RDW: 13 % (ref 11.0–15.0)
Total Lymphocyte: 30.7 %
WBC: 5.1 10*3/uL (ref 3.8–10.8)

## 2023-07-20 LAB — LIPID PANEL
Cholesterol: 103 mg/dL (ref <200)
HDL: 52 mg/dL (ref >=40)
LDL Cholesterol (Calc): 39 mg/dL
Non-HDL Cholesterol (Calc): 51 mg/dL (ref <130)
Total CHOL/HDL Ratio: 2 (calc) (ref <5.0)
Triglycerides: 42 mg/dL (ref <150)

## 2023-07-20 LAB — HEMOGLOBIN A1C W/OUT EAG: Hgb A1c MFr Bld: 6.4 %{Hb} — ABNORMAL HIGH (ref <5.7)

## 2023-07-20 LAB — TSH: TSH: 0.86 m[IU]/L (ref 0.40–4.50)

## 2023-07-25 DIAGNOSIS — L97821 Non-pressure chronic ulcer of other part of left lower leg limited to breakdown of skin: Secondary | ICD-10-CM | POA: Diagnosis not present

## 2023-07-25 DIAGNOSIS — E1122 Type 2 diabetes mellitus with diabetic chronic kidney disease: Secondary | ICD-10-CM | POA: Diagnosis not present

## 2023-07-25 DIAGNOSIS — I872 Venous insufficiency (chronic) (peripheral): Secondary | ICD-10-CM | POA: Diagnosis not present

## 2023-07-25 DIAGNOSIS — I69351 Hemiplegia and hemiparesis following cerebral infarction affecting right dominant side: Secondary | ICD-10-CM | POA: Diagnosis not present

## 2023-07-25 DIAGNOSIS — N182 Chronic kidney disease, stage 2 (mild): Secondary | ICD-10-CM | POA: Diagnosis not present

## 2023-07-25 DIAGNOSIS — E1141 Type 2 diabetes mellitus with diabetic mononeuropathy: Secondary | ICD-10-CM | POA: Diagnosis not present

## 2023-07-26 ENCOUNTER — Ambulatory Visit (HOSPITAL_COMMUNITY)
Admission: RE | Admit: 2023-07-26 | Discharge: 2023-07-26 | Disposition: A | Discharge status: Home / Self Care | Payer: Medicare Other | Source: Ambulatory Visit | Attending: Cardiology | Admitting: Cardiology

## 2023-07-26 VITALS — BP 111/51 | HR 67 | Wt 164.6 lb

## 2023-07-26 DIAGNOSIS — R635 Abnormal weight gain: Secondary | ICD-10-CM | POA: Insufficient documentation

## 2023-07-26 DIAGNOSIS — I5022 Chronic systolic (congestive) heart failure: Secondary | ICD-10-CM | POA: Diagnosis not present

## 2023-07-26 DIAGNOSIS — Z1383 Encounter for screening for respiratory disorder NEC: Secondary | ICD-10-CM | POA: Diagnosis not present

## 2023-07-26 DIAGNOSIS — Z0130 Encounter for examination of blood pressure without abnormal findings: Secondary | ICD-10-CM | POA: Diagnosis not present

## 2023-07-26 NOTE — Patient Instructions (Signed)
It was great to see you today! No medication changes are needed at this time.  Your physician recommends that you schedule a follow-up appointment with Dr Shirlee Latch   Do the following things EVERYDAY: Weigh yourself in the morning before breakfast. Write it down and keep it in a log. Take your medicines as prescribed Eat low salt foods--Limit salt (sodium) to 2000 mg per day.  Stay as active as you can everyday Limit all fluids for the day to less than 2 liters   At the [facility], you and your health needs are our priority. As part of our continuing mission to provide you with exceptional heart care, we have created designated Provider Care Teams. These Care Teams include your primary Cardiologist (physician) and Advanced Practice Providers (APPs- Physician Assistants and Nurse Practitioners) who all work together to provide you with the care you need, when you need it.   You may see any of the following providers on your designated Care Team at your next follow up: Dr Arvilla Meres Dr Marca Ancona Dr. Marcos Eke, NP Robbie Lis, [place] [facility] [place], [place] Brynda Peon, NP Karle Plumber, PharmD   Please be sure to bring in all your medications bottles to every appointment.    Thank you for choosing [facility]

## 2023-07-26 NOTE — Progress Notes (Signed)
ReDS Vest / Clip - 07/26/23 1400       ReDS Vest / Clip   Station Marker C    Ruler Value 27.5    ReDS Value Range Low volume    ReDS Actual Value 30              Pt present to office for  REDS reading per Dr Shirlee Latch -daughter reports weight gain over the past 1-2 weeks Home weight normally 151-156 home weight today 160\ Denies SOB Mild abdomen swelling Pt currently has leg wraps family unable to assess for swelling Reports compliance with medications Recently completed PT, patient has been more stationary without active PT.   Above reviewed with Dr Shirlee Latch -no changes at this time

## 2023-07-27 DIAGNOSIS — I872 Venous insufficiency (chronic) (peripheral): Secondary | ICD-10-CM | POA: Diagnosis not present

## 2023-07-27 DIAGNOSIS — E1122 Type 2 diabetes mellitus with diabetic chronic kidney disease: Secondary | ICD-10-CM | POA: Diagnosis not present

## 2023-07-27 DIAGNOSIS — N182 Chronic kidney disease, stage 2 (mild): Secondary | ICD-10-CM | POA: Diagnosis not present

## 2023-07-27 DIAGNOSIS — E1141 Type 2 diabetes mellitus with diabetic mononeuropathy: Secondary | ICD-10-CM | POA: Diagnosis not present

## 2023-07-27 DIAGNOSIS — L97821 Non-pressure chronic ulcer of other part of left lower leg limited to breakdown of skin: Secondary | ICD-10-CM | POA: Diagnosis not present

## 2023-07-27 DIAGNOSIS — I69351 Hemiplegia and hemiparesis following cerebral infarction affecting right dominant side: Secondary | ICD-10-CM | POA: Diagnosis not present

## 2023-07-28 DIAGNOSIS — E851 Neuropathic heredofamilial amyloidosis: Secondary | ICD-10-CM | POA: Diagnosis not present

## 2023-07-31 ENCOUNTER — Other Ambulatory Visit: Payer: Self-pay | Admitting: Nurse Practitioner

## 2023-08-01 ENCOUNTER — Other Ambulatory Visit (HOSPITAL_COMMUNITY): Payer: Self-pay

## 2023-08-01 DIAGNOSIS — L97821 Non-pressure chronic ulcer of other part of left lower leg limited to breakdown of skin: Secondary | ICD-10-CM | POA: Diagnosis not present

## 2023-08-01 DIAGNOSIS — E1122 Type 2 diabetes mellitus with diabetic chronic kidney disease: Secondary | ICD-10-CM | POA: Diagnosis not present

## 2023-08-01 DIAGNOSIS — I872 Venous insufficiency (chronic) (peripheral): Secondary | ICD-10-CM | POA: Diagnosis not present

## 2023-08-01 DIAGNOSIS — E1141 Type 2 diabetes mellitus with diabetic mononeuropathy: Secondary | ICD-10-CM | POA: Diagnosis not present

## 2023-08-01 DIAGNOSIS — I69351 Hemiplegia and hemiparesis following cerebral infarction affecting right dominant side: Secondary | ICD-10-CM | POA: Diagnosis not present

## 2023-08-01 DIAGNOSIS — N182 Chronic kidney disease, stage 2 (mild): Secondary | ICD-10-CM | POA: Diagnosis not present

## 2023-08-03 DIAGNOSIS — E1141 Type 2 diabetes mellitus with diabetic mononeuropathy: Secondary | ICD-10-CM | POA: Diagnosis not present

## 2023-08-03 DIAGNOSIS — N182 Chronic kidney disease, stage 2 (mild): Secondary | ICD-10-CM | POA: Diagnosis not present

## 2023-08-03 DIAGNOSIS — E1122 Type 2 diabetes mellitus with diabetic chronic kidney disease: Secondary | ICD-10-CM | POA: Diagnosis not present

## 2023-08-03 DIAGNOSIS — I872 Venous insufficiency (chronic) (peripheral): Secondary | ICD-10-CM | POA: Diagnosis not present

## 2023-08-03 DIAGNOSIS — L97821 Non-pressure chronic ulcer of other part of left lower leg limited to breakdown of skin: Secondary | ICD-10-CM | POA: Diagnosis not present

## 2023-08-03 DIAGNOSIS — I69351 Hemiplegia and hemiparesis following cerebral infarction affecting right dominant side: Secondary | ICD-10-CM | POA: Diagnosis not present

## 2023-08-06 DIAGNOSIS — I87312 Chronic venous hypertension (idiopathic) with ulcer of left lower extremity: Secondary | ICD-10-CM | POA: Diagnosis not present

## 2023-08-06 DIAGNOSIS — L97821 Non-pressure chronic ulcer of other part of left lower leg limited to breakdown of skin: Secondary | ICD-10-CM | POA: Diagnosis not present

## 2023-08-07 DIAGNOSIS — N182 Chronic kidney disease, stage 2 (mild): Secondary | ICD-10-CM | POA: Diagnosis not present

## 2023-08-07 DIAGNOSIS — I69351 Hemiplegia and hemiparesis following cerebral infarction affecting right dominant side: Secondary | ICD-10-CM | POA: Diagnosis not present

## 2023-08-07 DIAGNOSIS — E1141 Type 2 diabetes mellitus with diabetic mononeuropathy: Secondary | ICD-10-CM | POA: Diagnosis not present

## 2023-08-07 DIAGNOSIS — I872 Venous insufficiency (chronic) (peripheral): Secondary | ICD-10-CM | POA: Diagnosis not present

## 2023-08-07 DIAGNOSIS — L97821 Non-pressure chronic ulcer of other part of left lower leg limited to breakdown of skin: Secondary | ICD-10-CM | POA: Diagnosis not present

## 2023-08-07 DIAGNOSIS — E1122 Type 2 diabetes mellitus with diabetic chronic kidney disease: Secondary | ICD-10-CM | POA: Diagnosis not present

## 2023-08-10 DIAGNOSIS — N182 Chronic kidney disease, stage 2 (mild): Secondary | ICD-10-CM | POA: Diagnosis not present

## 2023-08-10 DIAGNOSIS — I69351 Hemiplegia and hemiparesis following cerebral infarction affecting right dominant side: Secondary | ICD-10-CM | POA: Diagnosis not present

## 2023-08-10 DIAGNOSIS — E1141 Type 2 diabetes mellitus with diabetic mononeuropathy: Secondary | ICD-10-CM | POA: Diagnosis not present

## 2023-08-10 DIAGNOSIS — L97821 Non-pressure chronic ulcer of other part of left lower leg limited to breakdown of skin: Secondary | ICD-10-CM | POA: Diagnosis not present

## 2023-08-10 DIAGNOSIS — I872 Venous insufficiency (chronic) (peripheral): Secondary | ICD-10-CM | POA: Diagnosis not present

## 2023-08-10 DIAGNOSIS — E1122 Type 2 diabetes mellitus with diabetic chronic kidney disease: Secondary | ICD-10-CM | POA: Diagnosis not present

## 2023-08-12 ENCOUNTER — Encounter (HOSPITAL_COMMUNITY): Payer: Self-pay

## 2023-08-15 DIAGNOSIS — I69351 Hemiplegia and hemiparesis following cerebral infarction affecting right dominant side: Secondary | ICD-10-CM | POA: Diagnosis not present

## 2023-08-15 DIAGNOSIS — E1141 Type 2 diabetes mellitus with diabetic mononeuropathy: Secondary | ICD-10-CM | POA: Diagnosis not present

## 2023-08-15 DIAGNOSIS — L97821 Non-pressure chronic ulcer of other part of left lower leg limited to breakdown of skin: Secondary | ICD-10-CM | POA: Diagnosis not present

## 2023-08-15 DIAGNOSIS — E1122 Type 2 diabetes mellitus with diabetic chronic kidney disease: Secondary | ICD-10-CM | POA: Diagnosis not present

## 2023-08-15 DIAGNOSIS — I872 Venous insufficiency (chronic) (peripheral): Secondary | ICD-10-CM | POA: Diagnosis not present

## 2023-08-15 DIAGNOSIS — N182 Chronic kidney disease, stage 2 (mild): Secondary | ICD-10-CM | POA: Diagnosis not present

## 2023-08-17 DIAGNOSIS — I69351 Hemiplegia and hemiparesis following cerebral infarction affecting right dominant side: Secondary | ICD-10-CM | POA: Diagnosis not present

## 2023-08-17 DIAGNOSIS — N182 Chronic kidney disease, stage 2 (mild): Secondary | ICD-10-CM | POA: Diagnosis not present

## 2023-08-17 DIAGNOSIS — I872 Venous insufficiency (chronic) (peripheral): Secondary | ICD-10-CM | POA: Diagnosis not present

## 2023-08-17 DIAGNOSIS — L97821 Non-pressure chronic ulcer of other part of left lower leg limited to breakdown of skin: Secondary | ICD-10-CM | POA: Diagnosis not present

## 2023-08-17 DIAGNOSIS — E1141 Type 2 diabetes mellitus with diabetic mononeuropathy: Secondary | ICD-10-CM | POA: Diagnosis not present

## 2023-08-17 DIAGNOSIS — E1122 Type 2 diabetes mellitus with diabetic chronic kidney disease: Secondary | ICD-10-CM | POA: Diagnosis not present

## 2023-08-21 DIAGNOSIS — I69351 Hemiplegia and hemiparesis following cerebral infarction affecting right dominant side: Secondary | ICD-10-CM | POA: Diagnosis not present

## 2023-08-21 DIAGNOSIS — E1141 Type 2 diabetes mellitus with diabetic mononeuropathy: Secondary | ICD-10-CM | POA: Diagnosis not present

## 2023-08-21 DIAGNOSIS — E1122 Type 2 diabetes mellitus with diabetic chronic kidney disease: Secondary | ICD-10-CM | POA: Diagnosis not present

## 2023-08-21 DIAGNOSIS — N182 Chronic kidney disease, stage 2 (mild): Secondary | ICD-10-CM | POA: Diagnosis not present

## 2023-08-21 DIAGNOSIS — I872 Venous insufficiency (chronic) (peripheral): Secondary | ICD-10-CM | POA: Diagnosis not present

## 2023-08-21 DIAGNOSIS — L97821 Non-pressure chronic ulcer of other part of left lower leg limited to breakdown of skin: Secondary | ICD-10-CM | POA: Diagnosis not present

## 2023-08-23 ENCOUNTER — Ambulatory Visit: Payer: Medicare Other | Attending: Cardiology | Admitting: Cardiology

## 2023-08-23 VITALS — BP 106/57 | HR 66 | Wt 161.0 lb

## 2023-08-23 DIAGNOSIS — I5022 Chronic systolic (congestive) heart failure: Secondary | ICD-10-CM | POA: Diagnosis not present

## 2023-08-23 NOTE — Patient Instructions (Signed)
Medication Changes:  No changes, continue current medications  Lab Work:  Labs done today, your results will be available in MyChart, we will contact you for abnormal readings.  Testing/Procedures:  Your physician has requested that you have an echocardiogram. Echocardiography is a painless test that uses sound waves to create images of your heart. It provides your doctor with information about the size and shape of your heart and how well your heart's chambers and valves are working. This procedure takes approximately one hour. There are no restrictions for this procedure. Please do NOT wear cologne, perfume, aftershave, or lotions (deodorant is allowed). Please arrive 15 minutes prior to your appointment time. Wed. 09/20/23 at 10 am  Referrals:  none  Special Instructions // Education:  Do the following things EVERYDAY: Weigh yourself in the morning before breakfast. Write it down and keep it in a log. Take your medicines as prescribed Eat low salt foods--Limit salt (sodium) to 2000 mg per day.  Stay as active as you can everyday Limit all fluids for the day to less than 2 liters   Follow-Up in: 3 months (January), we will call you closer to this time to schedule your appointment    If you have any questions or concerns before your next appointment please send [place] a message through [place] or call our office at [phone] Monday-Friday 8 am-5 pm.   If you have an urgent need after hours on the weekend please call your Primary Cardiologist or the [facility] in [place] at [phone].   At the [facility], you and your health needs are our priority. We have a designated team specialized in the treatment of Heart Failure. This Care Team includes your primary Heart Failure Specialized Cardiologist (physician), Advanced Practice Providers (APPs- Physician Assistants and Nurse Practitioners), and Pharmacist who all work together to provide  you with the care you need, when you need it.   You may see any of the following providers on your designated Care Team at your next follow up:  Dr. Arvilla Meres Dr. Marca Ancona Dr. Dorthula Nettles Dr. Theresia Bough Tonye Becket, NP Robbie Lis, [place] [address] [place], [place] Brynda Peon, NP [place] Lee, NP Clarisa Kindred, NP Enos Fling, PharmD

## 2023-08-23 NOTE — Progress Notes (Signed)
PCP: [name], MD Cardiology: Dr. Graciela Husbands HF Cardiology: Dr. Shirlee Latch  82 y.o. with history of type 2 DM, HTN, CVA, paroxysmal atrial fibrillation, and chronic systolic CHF was referred by Dr. Graciela Husbands for evaluation of CHF.  Patient is on Eliquis now for PAF, he had a CVA in 2018.  Echo at that time showed EF 55-60%.  In the summer of 2022, he developed progressive exertional dyspnea. He was seen in the ER and was noted to have mildly elevate HS-TnI without trend, thought to be due to volume overload from CHF.  Echo was done in 9/22 showing EF 25-30%, mild LVH, moderate RV enlargement with normal RV systolic function, PASP 65 mmHg, moderate TR, moderate AI, moderate MR, IVC dilated.  Cardiolite in 9/22 showed EF 27%, no ischemia.  Patient was started on Lasix due to volume overload and it was titrated up.  Patient does not drink ETOH, smoke, or use drugs.  He has not family history of heart disease.    RHC/LHC in 11/22 showed mild nonobstructive CAD, CI 4.36, optimized filling pressures.  Cardiac MRI in 1/23 showed LV EF 45%, mild RV dilation with RV EF 48%, mid-wall patchy LGE in the basal to mid septum, ECV 52% in septum, mild-moderate MR, mild-moderate AI. 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   Seen by Neurology for formal evaluation of peripheral neuropathy and saw Dr. Jomarie Longs for genetics evaluation.  He has started on vutrisiran.   Today he returns for HF follow up with his daughter. Not very active but feeling ok.  Goes to church and to stores.  Uses a walker outside the house.  No significant exertional dyspnea walking on flat ground though he does have significant fatigue with exertion.  No chest pain.  Gets lightheaded if he stands too fast. He says that vutrisiran has helped with peripheral neuropathy symptoms.   Labs (10/22): LDL 44, TSH normal Labs (11/22): K 4, creatinine 1.17 Labs (12/22): K 4.3, creatinine  1.22 Labs (2/23): K 4.1, creatinine 1.19, hgb 12, myeloma panel with IgG monoclonal protein.  Labs (5/23): K 4.2, creatinine 1.19 Labs (10/23): K 4.6, creatinine 1.08, LDL 48, HDL 61 Labs (8/24): K 4.6, creatinine 1.06, LFTs normal, TSH normal, LDL 39  ECG (personally reviewed): NSR, septal Qs  PMH: 1. Type 2 diabetes 2. HTN 3. Hyperlipidemia 4. Atrial fibrillation: Paroxysmal 5. 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 Right-sided weakness.  6. GERD 7. Hypothyroidism 8. Chronic systolic CHF: Echo (9/18) with EF 55-60%.  - Echo (9/22): EF 25-30%, mild LVH, moderate RV enlargement with normal RV systolic function, PASP 65 mmHg, moderate TR, moderate AI, moderate MR, IVC dilated.  - Cardiolite (9/22): EF 27%, no ischemia.  - LHC/RHC (11/22): Mild nonobstructive CAD; mean RA 2, PA 41/7 mean 22, mean PCWP 11, CI 4.36 - Cardiac MRI (1/23): LV EF 45%, mild RV dilation with RV EF 48%, mid-wall patchy LGE in the basal to mid septum, ECV 52% in septum, mild-moderate MR, mild-moderate AI.  9. IgG kappa MGUS since 2004: Bone marrow biopsy 2004 with 2% plasma cells.  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  11. Peripheral neuropathy  Social History   Socioeconomic History   Marital status: Widowed    Spouse name: Not on file   Number of children: 2   Years of education: Not on file   Highest education level: Not  on file  Occupational History   Occupation: Academic librarian: RETIRED  Tobacco Use   Smoking status: Never   Smokeless tobacco: Never  Vaping Use   Vaping status: Never Used  Substance and Sexual Activity   Alcohol use: No   Drug use: No   Sexual activity: Not on file  Other Topics Concern   Not on file  Social History Narrative   Right handed   Drinks caffeine prn   One story home   Social Determinants of Health   Financial Resource Strain: Not on file  Food  Insecurity: Not on file  Transportation Needs: Not on file  Physical Activity: Not on file  Stress: Not on file  Social Connections: Not on file  Intimate Partner Violence: Not on file   Family History  Problem Relation Age of Onset   Sickle cell anemia Mother    Diabetes Father    Stomach cancer Father    Colon cancer Father    CAD Neg Hx    Esophageal cancer Neg Hx    Rectal cancer Neg Hx    ROS: All systems reviewed and negative except as per HPI.   Current Outpatient Medications  Medication Sig Dispense Refill   AMVUTTRA 25 MG/0.5ML syringe Inject into the skin.     apixaban (ELIQUIS) 5 MG TABS tablet TAKE 1 TABLET BY MOUTH  TWICE DAILY TO PREVENT  BLOOD CLOTS 180 tablet 3   atorvastatin (LIPITOR) 40 MG tablet Take  1 tablet  Daily for Cholesterol                                                       /                                  TAKE                                                       BY                                           MOUTH 90 tablet 3   Blood Glucose Monitoring Suppl (ONE TOUCH ULTRA 2) w/Device KIT Check blood sugar 1 time a day-DX-E11.9 1 kit 0   Cholecalciferol (VITAMIN D PO) Take 1 tablet by mouth daily.      clotrimazole-betamethasone (LOTRISONE) cream Apply to affected area 2 to 4 x / day 45 g 3   Cyanocobalamin (B-12) 50 MCG TABS Take 50 mcg by mouth daily.     Emollient (AQUAPHOR ADV PROTECT HEALING) OINT Apply small amount to two areas on buttocks daily to prevent breakdown 99 g 1   ferrous sulfate 325 (65 FE) MG tablet Take 325 mg by mouth at bedtime.     finasteride (PROSCAR) 5 MG tablet TAKE 1 TABLET BY MOUTH DAILY FOR PROSTATE 70 tablet 4   glipiZIDE (GLUCOTROL) 5 MG tablet Patient takes 0.5 tablet by mouth twice a  day.     glucose blood (ONETOUCH ULTRA) test strip Use as instructed 100 strip 3   Lancets (ONETOUCH DELICA PLUS LANCET30G) MISC CHECK BLOOD SUGAR ONCE A DAY. 100 each 3   Lancets Misc. (ONE TOUCH SURESOFT) MISC Check blood sugar 1  time a day-DX-E11.91 1 each 0   levothyroxine (SYNTHROID) 50 MCG tablet TAKE 1 TABLET BY MOUTH DAILY ON  AN EMPTY STOMACH WITH ONLY WATER FOR 30 MINUTES AND NO ANTACID,  MEDS, CALCIUM OR MAGNESIUM FOR 4 HOURS AND AVOID BIOTIN 100 tablet 2   Magnesium 250 MG TABS Take 250 mg by mouth daily.      metFORMIN (GLUCOPHAGE-XR) 500 MG 24 hr tablet TAKE 2 TABLETS BY MOUTH TWICE  DAILY WITH MEALS FOR DIABETES 400 tablet 2   metoprolol succinate (TOPROL-XL) 50 MG 24 hr tablet TAKE 1 TABLET BY MOUTH AT  BEDTIME 100 tablet 2   Multiple Vitamin (MULTIVITAMIN) capsule Take 1 capsule by mouth daily.     NONFORMULARY OR COMPOUNDED ITEM Please dispense a Rolling Walker.  Dx:R26.81 & G63 1 each 0   potassium chloride SA (KLOR-CON M) 20 MEQ tablet Take 1 tablet (20 mEq total) by mouth daily. 90 tablet 3   sacubitril-valsartan (ENTRESTO) 24-26 MG Take 1 tablet by mouth 2 (two) times daily. 180 tablet 3   spironolactone (ALDACTONE) 25 MG tablet Take 1 tablet (25 mg total) by mouth at bedtime. 90 tablet 3   Tafamidis (VYNDAMAX) 61 MG CAPS Take 1 capsule by mouth daily. 30 capsule 11   torsemide (DEMADEX) 20 MG tablet Take  1 tablet   2 x /day  for Fluid Retention & Leg / Ankle Swelling 180 tablet 3   triamcinolone cream (KENALOG) 0.1 % Apply 1 Application topically as needed.     trolamine salicylate (ASPERCREME) 10 % cream Apply 1 application topically daily as needed for muscle pain.     VITAMIN A PO Take by mouth. 3000 mg     vitamin C (ASCORBIC ACID) 500 MG tablet Take 500 mg by mouth daily.     No current facility-administered medications for this visit.   Wt Readings from Last 3 Encounters:  08/23/23 161 lb (73 kg)  07/26/23 164 lb 9.6 oz (74.7 kg)  07/19/23 165 lb 9.6 oz (75.1 kg)   BP (!) 106/57   Pulse 66   Wt 161 lb (73 kg)   SpO2 100%   BMI 22.45 kg/m  General: NAD Neck: No JVD, no thyromegaly or thyroid nodule.  Lungs: Clear to auscultation bilaterally with normal respiratory effort. CV:  Nondisplaced PMI.  Heart regular S1/S2, no S3/S4, no murmur.  No peripheral edema.  No carotid bruit.  Normal pedal pulses.  Abdomen: Soft, nontender, no hepatosplenomegaly, no distention.  Skin: Intact without lesions or rashes.  Neurologic: Alert and oriented x 3.  Psych: Normal affect. Extremities: No clubbing or cyanosis.  HEENT: Normal.   Assessment/Plan: 1. Chronic systolic CHF:  Nonischemic cardiomyopathy.  Echo in 9/22 with EF 25-30%, mild LVH, moderate RV enlargement with normal RV systolic function, PASP 65 mmHg, moderate TR, moderate AI, moderate MR, IVC dilated.  LHC/RHC in 11/22 showed mild nonobstructive CAD with optimized filling pressures and preserved cardiac output.  No history of drugs/ETOH.  No family history of cardiomyopathy.  Cardiac MRI in 1/23 showed LV EF 45%, mild RV dilation with RV EF 48%, mid-wall patchy LGE in the basal to mid septum, ECV 52% in septum, mild-moderate MR, mild-moderate AI.  PYP scan was suggestive  of TTR cardiac amyloidosis.  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 NYHA class II-III symptoms, not volume overloaded on exam.  - Off Farxiga given balanitis. - Continue torsemide 20 mg bid.  BMET/BNP today.  - Continue Entresto 24/26 bid, no BP room to increase.    - Continue Toprol XL 50 mg qhs, no BP room to increase.   - Continue spironolactone 25 mg qhs.   - Narrow QRS, not candidate for CRT.  Would not place ICD given age.  - I will arrange for repeat echo.   2. Atrial fibrillation: Paroxysmal. History of CVA. NSR today. - Continue Eliquis. 3. Hyperlipidemia: Good lipids in 8/24.  - Continue atorvastatin.  4. Cardiac amyloidosis: PYP scan suggestive of TTR cardiac amyloidosis, cardiac MRI also suggestive of amyloidosis. He has peripheral neuropathy and autonomic neuropathy (orthostatic symptoms). 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  He saw Dr. Jomarie Longs for genetic counseling.  - Continue  tafamidis.  - Continue vutrisiran, this has helped his peripheral neuropathy.   Follow up in 3 months   Marca Ancona  08/23/2023

## 2023-08-24 DIAGNOSIS — E1141 Type 2 diabetes mellitus with diabetic mononeuropathy: Secondary | ICD-10-CM | POA: Diagnosis not present

## 2023-08-24 DIAGNOSIS — I69351 Hemiplegia and hemiparesis following cerebral infarction affecting right dominant side: Secondary | ICD-10-CM | POA: Diagnosis not present

## 2023-08-24 DIAGNOSIS — N182 Chronic kidney disease, stage 2 (mild): Secondary | ICD-10-CM | POA: Diagnosis not present

## 2023-08-24 DIAGNOSIS — I872 Venous insufficiency (chronic) (peripheral): Secondary | ICD-10-CM | POA: Diagnosis not present

## 2023-08-24 DIAGNOSIS — E1122 Type 2 diabetes mellitus with diabetic chronic kidney disease: Secondary | ICD-10-CM | POA: Diagnosis not present

## 2023-08-24 DIAGNOSIS — L97821 Non-pressure chronic ulcer of other part of left lower leg limited to breakdown of skin: Secondary | ICD-10-CM | POA: Diagnosis not present

## 2023-08-24 LAB — BASIC METABOLIC PANEL
BUN/Creatinine Ratio: 23 (ref 10–24)
BUN: 23 mg/dL (ref 8–27)
CO2: 23 mmol/L (ref 20–29)
Calcium: 9.7 mg/dL (ref 8.6–10.2)
Chloride: 103 mmol/L (ref 96–106)
Creatinine, Ser: 1 mg/dL (ref 0.76–1.27)
Glucose: 72 mg/dL (ref 70–99)
Potassium: 4.8 mmol/L (ref 3.5–5.2)
Sodium: 141 mmol/L (ref 134–144)
eGFR: 75 mL/min/{1.73_m2} (ref >59)

## 2023-08-24 LAB — BRAIN NATRIURETIC PEPTIDE: BNP: 99.2 pg/mL (ref 0.0–100.0)

## 2023-08-28 ENCOUNTER — Encounter: Payer: Self-pay | Admitting: Neurology

## 2023-08-28 ENCOUNTER — Ambulatory Visit: Payer: Medicare Other | Admitting: Neurology

## 2023-08-28 VITALS — BP 100/54 | HR 68 | Ht 71.0 in

## 2023-08-28 DIAGNOSIS — I43 Cardiomyopathy in diseases classified elsewhere: Secondary | ICD-10-CM | POA: Diagnosis not present

## 2023-08-28 DIAGNOSIS — G63 Polyneuropathy in diseases classified elsewhere: Secondary | ICD-10-CM

## 2023-08-28 DIAGNOSIS — Z8673 Personal history of transient ischemic attack (TIA), and cerebral infarction without residual deficits: Secondary | ICD-10-CM | POA: Diagnosis not present

## 2023-08-28 DIAGNOSIS — E854 Organ-limited amyloidosis: Secondary | ICD-10-CM

## 2023-08-28 NOTE — Progress Notes (Signed)
Follow-up Visit   Date: 08/28/2023    Earl [name] MRN: [id number] DOB: 1941/06/11    Arline Person is a 82 y.o. 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  The patient was accompanied to the clinic by daughter who also provides collateral information.    IMPRESSION/PLAN: Gait instability and falls due to neuropathy and history of cerebellar stroke, stable.   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  He is having greater difficulty with fine motor movements and grasping objects because of numbness and weakness.  - Continue Amvuttra - Continue daily multivitamin                 3.  Left cerebellar stroke with residual hemiataxia - Continue baclofen 5mg  at bedtime  Return to clinic in 6 months  --------------------------------------------- History of present illness: He was diagnosed with transthyretin amyloidosis based on positive PYP scan and confirmed to have V142I genetic mutation.  He was referred for neurological evaluation of his neuropathy.  Patient has a long history of neuropathy, which initially manifested with burning pain in the feet.  However, over the years, he no longer has any pain.  He takes gabapentin 600mg  at bedtime.   Now, he has predominately numbness in the feet and lower legs.  He has weakness of the right leg from stroke.  He has poor balance and has been using a rigid walker for the past 1.5 year.  He would like to use a cane, but it's too unsteady.  He uses a transport chair when outside of  the home.  He had one fall this year. Diabetes is well-controlled.    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     He lives in a one level home and daughter is the primary care giver.    Nonsmoker and never drank alcohol.  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    UPDATE 12/14/2022:  He is here for follow-up visit accompanied by daughter.  Daughter reports she has noticed that he tends to cough with eating and is concerned of risk of aspiration.  He has not had any choking spells.  She also is concerned that he tends to drag the right leg when walking and is very unsteady.  He has completed home PT which has helped and would like it extended.  He is having ongoing difficulty with holding objects and tends to drop things frequently.    UPDATE 02/24/2023:  Daughter scheduled a sooner visit due to patient having increased falls over the past two weeks.  He had a fall in front of his daughter and another fall with his caregiver were walking outside.  He could tell he was going to fall because of leg weakness, but unable to break his fall.  Fortunately, he has not had any injuries.  Daughter feels that he is dragging the right leg more and speech is also slurred at times.    UPDATE 04/25/2023:  He is here for follow-up visit.  He has been doing well with home PT and feels that it  has helped.  He did have one fall, but fortunately did not hurt himself.  He was able to stand up without assistance.  Neuropathy is unchanged and remains with numbness in the hands.  Fine motor tasks are challenging.  He is tolerating Amvuttra and due to for next injection. He will be celebrating his 82nd birthday this weekend and is in good spirits today.   UPDATE 08/28/2023:  He is here for follow-up visit.   He denies any new weakness.  His gait and balance is getting slightly worse and daughter feels legs are getting stiff again.  He has  fallen twice.  Neuropathy continues to involve the hands.  No new complaints.    Medications:  Current Outpatient Medications on File Prior to Visit  Medication Sig Dispense Refill   AMVUTTRA 25 MG/0.5ML syringe Inject into the skin.     apixaban (ELIQUIS) 5 MG TABS tablet TAKE 1 TABLET BY MOUTH  TWICE DAILY TO PREVENT  BLOOD CLOTS 180 tablet 3   atorvastatin (LIPITOR) 40 MG tablet Take  1 tablet  Daily for Cholesterol                                                       /                                  TAKE                                                       BY                                           MOUTH 90 tablet 3   Blood Glucose Monitoring Suppl (ONE TOUCH ULTRA 2) w/Device KIT Check blood sugar 1 time a day-DX-E11.9 1 kit 0   Cholecalciferol (VITAMIN D PO) Take 1 tablet by mouth daily.      clotrimazole-betamethasone (LOTRISONE) cream Apply to affected area 2 to 4 x / day 45 g 3   Cyanocobalamin (B-12) 50 MCG TABS Take 50 mcg by mouth daily.     Emollient (AQUAPHOR ADV PROTECT HEALING) OINT Apply small amount to two areas on buttocks daily to prevent breakdown 99 g 1   ferrous sulfate 325 (65 FE) MG tablet Take 325 mg by mouth at bedtime.     finasteride (PROSCAR) 5 MG tablet TAKE 1 TABLET BY MOUTH DAILY FOR PROSTATE 70 tablet 4   glipiZIDE (GLUCOTROL) 5 MG tablet Patient takes 0.5 tablet by mouth twice a day.     glucose blood (ONETOUCH ULTRA) test strip Use as instructed 100 strip 3   Lancets (ONETOUCH DELICA PLUS LANCET30G) MISC CHECK BLOOD SUGAR ONCE A DAY. 100 each 3   Lancets Misc. (ONE TOUCH SURESOFT) MISC Check blood sugar 1 time a day-DX-E11.91 1 each 0   levothyroxine (SYNTHROID) 50 MCG tablet TAKE 1 TABLET BY MOUTH DAILY ON  AN EMPTY STOMACH WITH ONLY WATER FOR 30 MINUTES AND NO ANTACID,  MEDS, CALCIUM OR MAGNESIUM FOR 4 HOURS AND AVOID BIOTIN 100 tablet 2   Magnesium 250 MG TABS Take 250 mg by mouth daily.      metFORMIN (GLUCOPHAGE-XR) 500 MG 24 hr tablet TAKE 2  TABLETS BY MOUTH TWICE  DAILY WITH MEALS FOR DIABETES 400 tablet 2   metoprolol succinate (TOPROL-XL) 50 MG 24 hr tablet TAKE 1 TABLET BY MOUTH AT  BEDTIME 100 tablet 2   Multiple Vitamin (MULTIVITAMIN) capsule Take 1 capsule by mouth daily.     NONFORMULARY OR COMPOUNDED ITEM Please dispense a Rolling Walker.  Dx:R26.81 & G63 1 each 0   potassium chloride SA (KLOR-CON M) 20 MEQ tablet Take 1 tablet (20 mEq total) by mouth daily. 90 tablet 3   sacubitril-valsartan (ENTRESTO) 24-26 MG Take 1 tablet by mouth 2 (two) times daily. 180 tablet 3   spironolactone (ALDACTONE) 25 MG tablet Take 1 tablet (25 mg total) by mouth at bedtime. 90 tablet 3   Tafamidis (VYNDAMAX) 61 MG CAPS Take 1 capsule by mouth daily. 30 capsule 11   torsemide (DEMADEX) 20 MG tablet Take  1 tablet   2 x /day  for Fluid Retention & Leg / Ankle Swelling 180 tablet 3   triamcinolone cream (KENALOG) 0.1 % Apply 1 Application topically as needed.     trolamine salicylate (ASPERCREME) 10 % cream Apply 1 application topically daily as needed for muscle pain.     VITAMIN A PO Take by mouth. 3000 mg     vitamin C (ASCORBIC ACID) 500 MG tablet Take 500 mg by mouth daily.     No current facility-administered medications on file prior to visit.    Allergies:  Allergies  Allergen Reactions   Penicillins Other (See Comments)    Did it involve swelling of the face/tongue/throat, SOB, or low BP? Unknown Did it involve sudden or severe rash/hives, skin peeling, or any reaction on the inside of your mouth or nose? Unknown Did you need to seek medical attention at a hospital or doctor's office? No When did it last happen?    Over 87 Years Ago   If all above answers are "NO", may proceed with cephalosporin use.      Vital Signs:  There were no vitals taken for this visit.   Neurological Exam: MENTAL STATUS including orientation to time, place, person, recent and remote memory, attention span and concentration, language, and fund  of knowledge is normal.  Speech is mildly dysarthric with prolonged talking (stable)  CRANIAL NERVES:     Normal conjugate, extra-ocular eye movements in all directions of gaze.  No ptosis.  Face is symmetric.  MOTOR:  Severe bilateral ABP atrophy, no fasciculations or abnormal movements.  Motor strength is 5/5 RUE proximally, 3/5 distally, RLE 5-/5, LUE and LLE 5/5.  No pronator drift.  Spasticity in the RLE.  MSRs:                                           Right        Left brachioradialis 3+  3+  biceps 3+  3+  triceps 3+  3+  patellar 3+  3+  ankle jerk 3+  3+   SENSORY:  Vibration, temperature and pin prick absent below the ankles and reduced at the knees.     COORDINATION/GAIT: Mild dysmetria with right finger to nose testing.  Intact rapid  alternating movements bilaterally. Gait not tested, as patient arrived in wheelchair.    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 A superimposed severe left carpal tunnel syndrome is also present.   MRI cervical spine wo contrast 09/06/2022: 1. Normal appearance of the cervical cord. 2. Ordinary cervical spine degeneration with foraminal impingement bilaterally at C3-4, C4-5 and on the right at C6-7.  MRI brain 07/26/2017: 1. Acute ischemic superior right cerebellar infarct without significant mass effect. Associated mild petechial hemorrhage without frank hemorrhagic transformation. 2. Otherwise normal brain MRI.  Total time spent reviewing records, interview, history/exam, documentation, and coordination of care on day of encounter:  20 min    Thank you for allowing me to participate in patient's care.  If I can answer any additional questions, I would be pleased to do so.    Sincerely,    Donelle Baba K. Allena Katz, DO

## 2023-08-31 ENCOUNTER — Other Ambulatory Visit: Payer: Self-pay

## 2023-08-31 ENCOUNTER — Other Ambulatory Visit (HOSPITAL_COMMUNITY): Payer: Self-pay

## 2023-08-31 ENCOUNTER — Other Ambulatory Visit (HOSPITAL_COMMUNITY): Payer: Self-pay | Admitting: Pharmacy Technician

## 2023-09-05 ENCOUNTER — Other Ambulatory Visit: Payer: Self-pay

## 2023-09-05 ENCOUNTER — Encounter (HOSPITAL_COMMUNITY): Payer: Self-pay

## 2023-09-05 NOTE — Progress Notes (Signed)
Specialty Pharmacy Ongoing Clinical Assessment Note  Earl [name] is a 82 y.o. male who is being followed by the specialty pharmacy service for RxSp Cardiology   Patient's specialty medication(s) reviewed today: Tafamidis   Missed doses in the last 4 weeks: 0   Patient/Caregiver did not have any additional questions or concerns.   Therapeutic benefit summary: Patient is achieving benefit   Adverse events/side effects summary: No adverse events/side effects   Patient's therapy is appropriate to: Continue    Goals Addressed             This Visit's Progress    Stabilization of disease       Patient is on track. Patient will maintain adherence. Per daughter patient is doing well on treatment. Provider recent notes from 10/2 state appropriate to continue therapy.          Follow up:  6 months  Otto Herb Specialty Pharmacist

## 2023-09-05 NOTE — Progress Notes (Signed)
Specialty Pharmacy Refill Coordination Note  Earl [name] is a 82 y.o. male contacted today regarding refills of specialty medication(s) Tafamidis   Patient requested Delivery   Delivery date: 09/08/23   Verified address: [address]  [place] [address]   Medication will be filled on 09/07/23.

## 2023-09-07 DIAGNOSIS — I69351 Hemiplegia and hemiparesis following cerebral infarction affecting right dominant side: Secondary | ICD-10-CM | POA: Diagnosis not present

## 2023-09-07 DIAGNOSIS — I872 Venous insufficiency (chronic) (peripheral): Secondary | ICD-10-CM | POA: Diagnosis not present

## 2023-09-07 DIAGNOSIS — E1122 Type 2 diabetes mellitus with diabetic chronic kidney disease: Secondary | ICD-10-CM | POA: Diagnosis not present

## 2023-09-07 DIAGNOSIS — N182 Chronic kidney disease, stage 2 (mild): Secondary | ICD-10-CM | POA: Diagnosis not present

## 2023-09-07 DIAGNOSIS — L97821 Non-pressure chronic ulcer of other part of left lower leg limited to breakdown of skin: Secondary | ICD-10-CM | POA: Diagnosis not present

## 2023-09-07 DIAGNOSIS — E1141 Type 2 diabetes mellitus with diabetic mononeuropathy: Secondary | ICD-10-CM | POA: Diagnosis not present

## 2023-09-12 ENCOUNTER — Telehealth: Payer: Self-pay | Admitting: Neurology

## 2023-09-12 DIAGNOSIS — I872 Venous insufficiency (chronic) (peripheral): Secondary | ICD-10-CM | POA: Diagnosis not present

## 2023-09-12 DIAGNOSIS — E1141 Type 2 diabetes mellitus with diabetic mononeuropathy: Secondary | ICD-10-CM | POA: Diagnosis not present

## 2023-09-12 DIAGNOSIS — E1122 Type 2 diabetes mellitus with diabetic chronic kidney disease: Secondary | ICD-10-CM | POA: Diagnosis not present

## 2023-09-12 DIAGNOSIS — I69351 Hemiplegia and hemiparesis following cerebral infarction affecting right dominant side: Secondary | ICD-10-CM | POA: Diagnosis not present

## 2023-09-12 DIAGNOSIS — N182 Chronic kidney disease, stage 2 (mild): Secondary | ICD-10-CM | POA: Diagnosis not present

## 2023-09-12 DIAGNOSIS — L97821 Non-pressure chronic ulcer of other part of left lower leg limited to breakdown of skin: Secondary | ICD-10-CM | POA: Diagnosis not present

## 2023-09-12 NOTE — Telephone Encounter (Signed)
[facility] and provided her with ok on verbal order.

## 2023-09-12 NOTE — Telephone Encounter (Signed)
Monique with PT called, she needs verbal orders for patient. 2 weeks x3 and 1 week  x3

## 2023-09-12 NOTE — Telephone Encounter (Signed)
OK to give orders.

## 2023-09-14 ENCOUNTER — Encounter: Payer: Self-pay | Admitting: Neurology

## 2023-09-14 ENCOUNTER — Encounter: Payer: Self-pay | Admitting: Internal Medicine

## 2023-09-14 ENCOUNTER — Encounter: Payer: Self-pay | Admitting: Cardiology

## 2023-09-14 DIAGNOSIS — I872 Venous insufficiency (chronic) (peripheral): Secondary | ICD-10-CM | POA: Diagnosis not present

## 2023-09-14 DIAGNOSIS — E1141 Type 2 diabetes mellitus with diabetic mononeuropathy: Secondary | ICD-10-CM | POA: Diagnosis not present

## 2023-09-14 DIAGNOSIS — L97821 Non-pressure chronic ulcer of other part of left lower leg limited to breakdown of skin: Secondary | ICD-10-CM | POA: Diagnosis not present

## 2023-09-14 DIAGNOSIS — E1122 Type 2 diabetes mellitus with diabetic chronic kidney disease: Secondary | ICD-10-CM | POA: Diagnosis not present

## 2023-09-14 DIAGNOSIS — I69351 Hemiplegia and hemiparesis following cerebral infarction affecting right dominant side: Secondary | ICD-10-CM | POA: Diagnosis not present

## 2023-09-14 DIAGNOSIS — N182 Chronic kidney disease, stage 2 (mild): Secondary | ICD-10-CM | POA: Diagnosis not present

## 2023-09-15 ENCOUNTER — Other Ambulatory Visit: Payer: Self-pay | Admitting: Nurse Practitioner

## 2023-09-15 ENCOUNTER — Encounter (HOSPITAL_COMMUNITY): Payer: Medicare Other | Admitting: Cardiology

## 2023-09-18 ENCOUNTER — Encounter: Payer: Medicare Other | Admitting: Internal Medicine

## 2023-09-18 DIAGNOSIS — E1141 Type 2 diabetes mellitus with diabetic mononeuropathy: Secondary | ICD-10-CM | POA: Diagnosis not present

## 2023-09-18 DIAGNOSIS — L97821 Non-pressure chronic ulcer of other part of left lower leg limited to breakdown of skin: Secondary | ICD-10-CM | POA: Diagnosis not present

## 2023-09-18 DIAGNOSIS — I872 Venous insufficiency (chronic) (peripheral): Secondary | ICD-10-CM | POA: Diagnosis not present

## 2023-09-18 DIAGNOSIS — E1122 Type 2 diabetes mellitus with diabetic chronic kidney disease: Secondary | ICD-10-CM | POA: Diagnosis not present

## 2023-09-18 DIAGNOSIS — I69351 Hemiplegia and hemiparesis following cerebral infarction affecting right dominant side: Secondary | ICD-10-CM | POA: Diagnosis not present

## 2023-09-18 DIAGNOSIS — N182 Chronic kidney disease, stage 2 (mild): Secondary | ICD-10-CM | POA: Diagnosis not present

## 2023-09-20 ENCOUNTER — Ambulatory Visit
Admission: RE | Admit: 2023-09-20 | Discharge: 2023-09-20 | Disposition: A | Discharge status: Home / Self Care | Payer: Medicare Other | Source: Ambulatory Visit | Attending: Cardiology | Admitting: Cardiology

## 2023-09-20 ENCOUNTER — Ambulatory Visit (HOSPITAL_BASED_OUTPATIENT_CLINIC_OR_DEPARTMENT_OTHER): Payer: Medicare Other | Admitting: Cardiology

## 2023-09-20 ENCOUNTER — Encounter: Payer: Medicare Other | Admitting: Internal Medicine

## 2023-09-20 VITALS — BP 88/62 | HR 68 | Wt 152.0 lb

## 2023-09-20 DIAGNOSIS — I080 Rheumatic disorders of both mitral and aortic valves: Secondary | ICD-10-CM | POA: Insufficient documentation

## 2023-09-20 DIAGNOSIS — I480 Paroxysmal atrial fibrillation: Secondary | ICD-10-CM | POA: Insufficient documentation

## 2023-09-20 DIAGNOSIS — I43 Cardiomyopathy in diseases classified elsewhere: Secondary | ICD-10-CM | POA: Insufficient documentation

## 2023-09-20 DIAGNOSIS — L97821 Non-pressure chronic ulcer of other part of left lower leg limited to breakdown of skin: Secondary | ICD-10-CM | POA: Diagnosis not present

## 2023-09-20 DIAGNOSIS — R54 Age-related physical debility: Secondary | ICD-10-CM | POA: Insufficient documentation

## 2023-09-20 DIAGNOSIS — I872 Venous insufficiency (chronic) (peripheral): Secondary | ICD-10-CM | POA: Diagnosis not present

## 2023-09-20 DIAGNOSIS — I69351 Hemiplegia and hemiparesis following cerebral infarction affecting right dominant side: Secondary | ICD-10-CM | POA: Diagnosis not present

## 2023-09-20 DIAGNOSIS — E854 Organ-limited amyloidosis: Secondary | ICD-10-CM | POA: Insufficient documentation

## 2023-09-20 DIAGNOSIS — Z8673 Personal history of transient ischemic attack (TIA), and cerebral infarction without residual deficits: Secondary | ICD-10-CM | POA: Diagnosis not present

## 2023-09-20 DIAGNOSIS — Z79899 Other long term (current) drug therapy: Secondary | ICD-10-CM | POA: Insufficient documentation

## 2023-09-20 DIAGNOSIS — I2510 Atherosclerotic heart disease of native coronary artery without angina pectoris: Secondary | ICD-10-CM | POA: Insufficient documentation

## 2023-09-20 DIAGNOSIS — Z7984 Long term (current) use of oral hypoglycemic drugs: Secondary | ICD-10-CM | POA: Insufficient documentation

## 2023-09-20 DIAGNOSIS — N481 Balanitis: Secondary | ICD-10-CM | POA: Diagnosis not present

## 2023-09-20 DIAGNOSIS — I5022 Chronic systolic (congestive) heart failure: Secondary | ICD-10-CM

## 2023-09-20 DIAGNOSIS — Z1589 Genetic susceptibility to other disease: Secondary | ICD-10-CM | POA: Insufficient documentation

## 2023-09-20 DIAGNOSIS — G909 Disorder of the autonomic nervous system, unspecified: Secondary | ICD-10-CM | POA: Insufficient documentation

## 2023-09-20 DIAGNOSIS — E1122 Type 2 diabetes mellitus with diabetic chronic kidney disease: Secondary | ICD-10-CM | POA: Diagnosis not present

## 2023-09-20 DIAGNOSIS — E1143 Type 2 diabetes mellitus with diabetic autonomic (poly)neuropathy: Secondary | ICD-10-CM | POA: Diagnosis not present

## 2023-09-20 DIAGNOSIS — E785 Hyperlipidemia, unspecified: Secondary | ICD-10-CM | POA: Diagnosis not present

## 2023-09-20 DIAGNOSIS — N182 Chronic kidney disease, stage 2 (mild): Secondary | ICD-10-CM | POA: Diagnosis not present

## 2023-09-20 DIAGNOSIS — E1141 Type 2 diabetes mellitus with diabetic mononeuropathy: Secondary | ICD-10-CM | POA: Diagnosis not present

## 2023-09-20 DIAGNOSIS — I428 Other cardiomyopathies: Secondary | ICD-10-CM | POA: Insufficient documentation

## 2023-09-20 DIAGNOSIS — Z7901 Long term (current) use of anticoagulants: Secondary | ICD-10-CM | POA: Diagnosis not present

## 2023-09-20 DIAGNOSIS — I110 Hypertensive heart disease with heart failure: Secondary | ICD-10-CM | POA: Insufficient documentation

## 2023-09-20 LAB — ECHOCARDIOGRAM COMPLETE
AR max vel: 2.79 cm2
AV Area VTI: 2.77 cm2
AV Area mean vel: 3.19 cm2
AV Mean grad: 2 mm[Hg]
AV Peak grad: 5.2 mm[Hg]
Ao pk vel: 1.14 m/s
Area-P 1/2: 2.82 cm2
Calc EF: 48.9 %
MV VTI: 3.09 cm2
P 1/2 time: 488 ms
S' Lateral: 3.8 cm
Single Plane A2C EF: 47 %
Single Plane A4C EF: 53.7 %

## 2023-09-20 NOTE — Progress Notes (Signed)
*  PRELIMINARY RESULTS* Echocardiogram 2D Echocardiogram has been performed.  Carolyne Fiscal 09/20/2023, 11:11 AM

## 2023-09-20 NOTE — Patient Instructions (Signed)
Do the following things EVERYDAY: Weigh yourself in the morning before breakfast. Write it down and keep it in a log. Take your medicines as prescribed Eat low salt foods--Limit salt (sodium) to 2000 mg per day.  Stay as active as you can everyday Limit all fluids for the day to less than 2 liters     Please call in December to schedule your January appointment.

## 2023-09-20 NOTE — Progress Notes (Signed)
PCP: [name], MD Cardiology: Dr. Graciela Husbands HF Cardiology: Dr. Shirlee Latch  82 y.o. with history of type 2 DM, HTN, CVA, paroxysmal atrial fibrillation, and chronic systolic CHF was referred by Dr. Graciela Husbands for evaluation of CHF.  Patient is on Eliquis now for PAF, he had a CVA in 2018.  Echo at that time showed EF 55-60%.  In the summer of 2022, he developed progressive exertional dyspnea. He was seen in the ER and was noted to have mildly elevate HS-TnI without trend, thought to be due to volume overload from CHF.  Echo was done in 9/22 showing EF 25-30%, mild LVH, moderate RV enlargement with normal RV systolic function, PASP 65 mmHg, moderate TR, moderate AI, moderate MR, IVC dilated.  Cardiolite in 9/22 showed EF 27%, no ischemia.  Patient was started on Lasix due to volume overload and it was titrated up.  Patient does not drink ETOH, smoke, or use drugs.  He has not family history of heart disease.    RHC/LHC in 11/22 showed mild nonobstructive CAD, CI 4.36, optimized filling pressures.  Cardiac MRI in 1/23 showed LV EF 45%, mild RV dilation with RV EF 48%, mid-wall patchy LGE in the basal to mid septum, ECV 52% in septum, mild-moderate MR, mild-moderate AI. 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   Seen by Neurology for formal evaluation of peripheral neuropathy and saw Dr. Jomarie Longs for genetics evaluation.  He has started on vutrisiran.   Echo was done today and reviewed, EF 45-50%, moderate LVH, mild RV dilation with normal systolic function, PASP 38 mmHg, IVC normal.   Today he returns for HF follow up with his daughter. Not very active but feeling ok.  Goes to church and to stores.  SBP generally in 90s at home, no lightheadedness or recent fall. He has difficulty with balance and uses a walker at home. He is getting PT for balance training.  No dyspnea or chest pain walking around the house but he moves slowly.  No chest  pain. Weight is down 9 lbs.   Labs (10/22): LDL 44, TSH normal Labs (11/22): K 4, creatinine 1.17 Labs (12/22): K 4.3, creatinine 1.22 Labs (2/23): K 4.1, creatinine 1.19, hgb 12, myeloma panel with IgG monoclonal protein.  Labs (5/23): K 4.2, creatinine 1.19 Labs (10/23): K 4.6, creatinine 1.08, LDL 48, HDL 61 Labs (8/24): K 4.6, creatinine 1.06, LFTs normal, TSH normal, LDL 39 Labs (10/24): BNP 99, K 4, creatinine 1.0  PMH: 1. Type 2 diabetes 2. HTN 3. Hyperlipidemia 4. Atrial fibrillation: Paroxysmal 5. 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 Right-sided weakness.  6. GERD 7. Hypothyroidism 8. Chronic systolic CHF: Echo (9/18) with EF 55-60%.  - Echo (9/22): EF 25-30%, mild LVH, moderate RV enlargement with normal RV systolic function, PASP 65 mmHg, moderate TR, moderate AI, moderate MR, IVC dilated.  - Cardiolite (9/22): EF 27%, no ischemia.  - LHC/RHC (11/22): Mild nonobstructive CAD; mean RA 2, PA 41/7 mean 22, mean PCWP 11, CI 4.36 - Cardiac MRI (1/23): LV EF 45%, mild RV dilation with RV EF 48%, mid-wall patchy LGE in the basal to mid septum, ECV 52% in septum, mild-moderate MR, mild-moderate AI.  - Echo (10/24): EF 45-50%, moderate LVH, mild RV dilation with normal systolic function, PASP 38 mmHg, IVC normal.  9. IgG kappa MGUS since 2004: Bone marrow biopsy 2004 with 2% plasma cells.  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  - PYP scan (  5/23): Grade 3, H/CL 1.54 - Val142Ile positive.  11. 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  Tobacco Use   Smoking status: Never   Smokeless tobacco: Never  Vaping Use   Vaping status: Never Used  Substance and Sexual Activity   Alcohol use: No    Drug use: No   Sexual activity: Not on file  Other Topics Concern   Not on file  Social History Narrative   Right handed   Drinks caffeine prn   One story home   Social Determinants of Health   Financial Resource Strain: Not on file  Food Insecurity: Not on file  Transportation Needs: Not on file  Physical Activity: Not on file  Stress: Not on file  Social Connections: Not on file  Intimate Partner Violence: Not on file   Family History  Problem Relation Age of Onset   Sickle cell anemia Mother    Diabetes Father    Stomach cancer Father    Colon cancer Father    CAD Neg Hx    Esophageal cancer Neg Hx    Rectal cancer Neg Hx    ROS: All systems reviewed and negative except as per HPI.   Current Outpatient Medications  Medication Sig Dispense Refill   AMVUTTRA 25 MG/0.5ML syringe Inject into the skin.     apixaban (ELIQUIS) 5 MG TABS tablet TAKE 1 TABLET BY MOUTH  TWICE DAILY TO PREVENT  BLOOD CLOTS 180 tablet 3   atorvastatin (LIPITOR) 40 MG tablet Take  1 tablet  Daily for Cholesterol                                                       /                                  TAKE                                                       BY                                           MOUTH 90 tablet 3   Blood Glucose Monitoring Suppl (ONE TOUCH ULTRA 2) w/Device KIT Check blood sugar 1 time a day-DX-E11.9 1 kit 0   Cholecalciferol (VITAMIN D PO) Take 1 tablet by mouth daily.      clotrimazole-betamethasone (LOTRISONE) cream Apply to affected area 2 to 4 x / day 45 g 3   Cyanocobalamin (B-12) 50 MCG TABS Take 50 mcg by mouth daily.     Emollient (AQUAPHOR ADV PROTECT HEALING) OINT Apply small amount to two areas on buttocks daily to prevent breakdown 99 g  1   ferrous sulfate 325 (65 FE) MG tablet Take 325 mg by mouth at bedtime.     finasteride (PROSCAR) 5 MG tablet TAKE 1 TABLET BY MOUTH DAILY FOR PROSTATE 100 tablet 2   glipiZIDE (GLUCOTROL) 5 MG tablet Patient takes 0.5 tablet by  mouth twice a day.     glucose blood (ONETOUCH ULTRA) test strip Use as instructed 100 strip 3   Lancets (ONETOUCH DELICA PLUS LANCET30G) MISC CHECK BLOOD SUGAR ONCE A DAY. 100 each 3   Lancets Misc. (ONE TOUCH SURESOFT) MISC Check blood sugar 1 time a day-DX-E11.91 1 each 0   levothyroxine (SYNTHROID) 50 MCG tablet TAKE 1 TABLET BY MOUTH DAILY ON  AN EMPTY STOMACH WITH ONLY WATER FOR 30 MINUTES AND NO ANTACID,  MEDS, CALCIUM OR MAGNESIUM FOR 4 HOURS AND AVOID BIOTIN 100 tablet 2   Magnesium 250 MG TABS Take 250 mg by mouth daily.      metFORMIN (GLUCOPHAGE-XR) 500 MG 24 hr tablet TAKE 2 TABLETS BY MOUTH TWICE  DAILY WITH MEALS FOR DIABETES 400 tablet 2   metoprolol succinate (TOPROL-XL) 50 MG 24 hr tablet TAKE 1 TABLET BY MOUTH AT  BEDTIME 100 tablet 2   Multiple Vitamin (MULTIVITAMIN) capsule Take 1 capsule by mouth daily.     NONFORMULARY OR COMPOUNDED ITEM Please dispense a Rolling Walker.  Dx:R26.81 & G63 1 each 0   potassium chloride SA (KLOR-CON M) 20 MEQ tablet Take 1 tablet (20 mEq total) by mouth daily. 90 tablet 3   sacubitril-valsartan (ENTRESTO) 24-26 MG Take 1 tablet by mouth 2 (two) times daily. 180 tablet 3   spironolactone (ALDACTONE) 25 MG tablet Take 1 tablet (25 mg total) by mouth at bedtime. 90 tablet 3   Tafamidis (VYNDAMAX) 61 MG CAPS Take 1 capsule by mouth daily. 30 capsule 11   torsemide (DEMADEX) 20 MG tablet Take  1 tablet   2 x /day  for Fluid Retention & Leg / Ankle Swelling 180 tablet 3   triamcinolone cream (KENALOG) 0.1 % Apply 1 Application topically as needed.     trolamine salicylate (ASPERCREME) 10 % cream Apply 1 application topically daily as needed for muscle pain.     VITAMIN A PO Take by mouth. 3000 mg     vitamin C (ASCORBIC ACID) 500 MG tablet Take 500 mg by mouth daily.     No current facility-administered medications for this visit.   Wt Readings from Last 3 Encounters:  09/20/23 152 lb (68.9 kg)  08/23/23 161 lb (73 kg)  07/26/23 164 lb 9.6 oz  (74.7 kg)   BP (!) 88/62   Pulse 68   Wt 152 lb (68.9 kg)   SpO2 100%   BMI 21.20 kg/m  General: NAD, frail Neck: No JVD, no thyromegaly or thyroid nodule.  Lungs: Clear to auscultation bilaterally with normal respiratory effort. CV: Nondisplaced PMI.  Heart regular S1/S2, no S3/S4, no murmur.  No peripheral edema.  No carotid bruit.  Normal pedal pulses.  Abdomen: Soft, nontender, no hepatosplenomegaly, no distention.  Skin: Intact without lesions or rashes.  Neurologic: Alert and oriented x 3.  Psych: Normal affect. Extremities: No clubbing or cyanosis.  HEENT: Normal.   Assessment/Plan: 1. Chronic systolic CHF:  Nonischemic cardiomyopathy.  Echo in 9/22 with EF 25-30%, mild LVH, moderate RV enlargement with normal RV systolic function, PASP 65 mmHg, moderate TR, moderate AI, moderate MR, IVC dilated.  LHC/RHC in 11/22 showed mild nonobstructive CAD with optimized filling pressures and preserved  cardiac output.  No history of drugs/ETOH.  No family history of cardiomyopathy.  Cardiac MRI in 1/23 showed LV EF 45%, mild RV dilation with RV EF 48%, mid-wall patchy LGE in the basal to mid septum, ECV 52% in septum, mild-moderate MR, mild-moderate AI.  Echo in 10/24 showed EF 45-50%, moderate LVH, mild RV dilation with normal systolic function, PASP 38 mmHg, IVC normal.   PYP scan was suggestive of TTR cardiac amyloidosis.  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 NYHA class II-III symptoms chronically, not volume overloaded on exam.  - Off Farxiga given balanitis. - Continue torsemide 20 mg bid.  BMET/BNP today.  - Continue Entresto 24/26 bid.    - Continue Toprol XL 50 mg qhs,.   - Continue spironolactone 25 mg qhs.   - EF is out of range for device therapy.  2. Atrial fibrillation: Paroxysmal. History of CVA. NSR today. - Continue Eliquis. 3. Hyperlipidemia: Good lipids in 8/24.  - Continue atorvastatin.  4. Cardiac  amyloidosis: PYP scan suggestive of TTR cardiac amyloidosis, cardiac MRI also suggestive of amyloidosis. He has peripheral neuropathy and autonomic neuropathy (orthostatic symptoms). 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  He saw Dr. Jomarie Longs for genetic counseling.  - Continue tafamidis.  - Continue vutrisiran, this has helped his peripheral neuropathy.   Follow up in 3 months   Marca Ancona  09/20/2023

## 2023-09-21 DIAGNOSIS — E1122 Type 2 diabetes mellitus with diabetic chronic kidney disease: Secondary | ICD-10-CM | POA: Diagnosis not present

## 2023-09-21 DIAGNOSIS — N182 Chronic kidney disease, stage 2 (mild): Secondary | ICD-10-CM | POA: Diagnosis not present

## 2023-09-21 DIAGNOSIS — E1141 Type 2 diabetes mellitus with diabetic mononeuropathy: Secondary | ICD-10-CM | POA: Diagnosis not present

## 2023-09-21 DIAGNOSIS — I69351 Hemiplegia and hemiparesis following cerebral infarction affecting right dominant side: Secondary | ICD-10-CM | POA: Diagnosis not present

## 2023-09-21 DIAGNOSIS — L97821 Non-pressure chronic ulcer of other part of left lower leg limited to breakdown of skin: Secondary | ICD-10-CM | POA: Diagnosis not present

## 2023-09-21 DIAGNOSIS — I872 Venous insufficiency (chronic) (peripheral): Secondary | ICD-10-CM | POA: Diagnosis not present

## 2023-09-22 MED ORDER — BACLOFEN 10 MG PO TABS: 5.0000 mg | ORAL_TABLET | Freq: Two times a day (BID) | ORAL | 3 refills | Status: DC

## 2023-09-24 ENCOUNTER — Other Ambulatory Visit (HOSPITAL_COMMUNITY): Payer: Self-pay | Admitting: Family Medicine

## 2023-09-25 ENCOUNTER — Encounter: Payer: Self-pay | Admitting: Cardiology

## 2023-09-25 ENCOUNTER — Encounter: Payer: Self-pay | Admitting: Neurology

## 2023-09-25 ENCOUNTER — Other Ambulatory Visit (HOSPITAL_COMMUNITY): Payer: Self-pay

## 2023-09-25 ENCOUNTER — Other Ambulatory Visit (HOSPITAL_COMMUNITY): Payer: Self-pay | Admitting: Pharmacy Technician

## 2023-09-25 ENCOUNTER — Encounter: Payer: Self-pay | Admitting: Nurse Practitioner

## 2023-09-25 NOTE — Progress Notes (Signed)
Specialty Pharmacy Refill Coordination Note  Earl [name] is a 82 y.o. male contacted today regarding refills of specialty medication(s) Tafamidis  Spoke with Daughter   Patient requested Delivery   Delivery date: 10/06/23   Verified address: [address]  [place]   Medication will be filled on 10/05/23.

## 2023-09-26 ENCOUNTER — Encounter: Payer: Self-pay | Admitting: Internal Medicine

## 2023-09-26 ENCOUNTER — Ambulatory Visit: Payer: Medicare Other | Admitting: Internal Medicine

## 2023-09-26 ENCOUNTER — Other Ambulatory Visit: Payer: Self-pay

## 2023-09-26 DIAGNOSIS — R2681 Unsteadiness on feet: Secondary | ICD-10-CM

## 2023-09-26 DIAGNOSIS — E854 Organ-limited amyloidosis: Secondary | ICD-10-CM

## 2023-09-26 DIAGNOSIS — E1122 Type 2 diabetes mellitus with diabetic chronic kidney disease: Secondary | ICD-10-CM | POA: Diagnosis not present

## 2023-09-26 DIAGNOSIS — N182 Chronic kidney disease, stage 2 (mild): Secondary | ICD-10-CM | POA: Diagnosis not present

## 2023-09-26 DIAGNOSIS — R292 Abnormal reflex: Secondary | ICD-10-CM

## 2023-09-26 DIAGNOSIS — E1141 Type 2 diabetes mellitus with diabetic mononeuropathy: Secondary | ICD-10-CM | POA: Diagnosis not present

## 2023-09-26 DIAGNOSIS — L97821 Non-pressure chronic ulcer of other part of left lower leg limited to breakdown of skin: Secondary | ICD-10-CM | POA: Diagnosis not present

## 2023-09-26 DIAGNOSIS — I69351 Hemiplegia and hemiparesis following cerebral infarction affecting right dominant side: Secondary | ICD-10-CM | POA: Diagnosis not present

## 2023-09-26 DIAGNOSIS — I872 Venous insufficiency (chronic) (peripheral): Secondary | ICD-10-CM | POA: Diagnosis not present

## 2023-09-26 NOTE — Progress Notes (Signed)
R  E  S  C  H  E  D  U  L  E  D  ARRIVED  40   MINUTES    LATE                                                                                                                                                                                                                                                                                                                                Annual  Screening/Preventative Visit  & Comprehensive Evaluation & Examination   Future Appointments  Date Time Provider Department  09/26/2023                             cpe  3:00 PM Earl Cowboy, MD [facility]  10/09/2023  1:15 PM Earl [name], DPM [facility]  01/10/2024  3:45 PM Earl [name], DPM [facility]  02/26/2024  3:30 PM Earl Sickle K, DO [facility]  07/18/2024 11:00 AM Earl Dick, NP [facility]  10/15/2024                              cpe  3:00 PM Earl Cowboy, MD [facility]         This very nice 82 y.o. [name]  with HTN, ASCVD/Hx CVA, HLD, T2_NIDDM  and Vitamin D Deficiency  presents for a Screening /Preventative Visit & comprehensive evaluation and management of multiple medical co-morbidities.   Abdominal CT scan in 2016 found Aortic Atherosclerosis. Patient is followed by Dr Earl [name] for MGUS (2004) . Patient is accompanied today by his daughter Earl [name] from [place].        HTN predates circa  1980. Patient's BP has been controlled at home.  Today's BP is at goal -                                 .  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  He has persistent Rt HP and ataxic gait and is followed by Dr Earl [name] / Neurology.    In May 2023 Cardiac MRI suggested Cardiac Amyloidosis followed by Dr Earl [name] for chronic systolic CHF (Non ischemic) . His cardiologists are Dr Earl [name] & Dr Earl [name]. Patient denies any cardiac symptoms as chest pain, palpitations, shortness of breath, dizziness , but does admit moderate swelling of legs despite doubling Lasix dose 40 ->80 mg 1 x /day.       Patient's hyperlipidemia is controlled with diet and Atorvastatin.  Patient denies myalgias or other medication SE's. 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 Last A1c was near goal:   Lab Results  Component Value Date   HGBA1C 6.0 (H) 09/10/2021         Finally, patient has history of Vitamin D Deficiency ("30" /2008) and last vitamin D was at goal:   Lab Results  Component Value Date   VD25OH 82 09/10/2021       Current Outpatient Medications  Medication Instructions   VITAMIN C 500 mg, Oral, Daily,     atorvastatin  40 MG tablet TAKE 1 TABLET  DAILY    B-12      50 mcg Daily   VITAMIN D  1 tablet, Oral, Daily   FARXIGA 10 mg, Oral, Daily before breakfast   ELIQUIS 5 MG TABS tablet TAKE 1 TABLET TWICE DAILY    ferrous sulfate  325 mg  Daily at bedtime   finasteride 5 MG tablet TAKE 1 TABLET DAILY    furosemide  80 mg -AM  & 40 mg -PM   gabapentin 600 mg Daily at bedtime   glipiZIDE 5 MG tablet TAKE 1 TABLET TWICE DAILY    Levothyroxine 50 MCG tablet Take  1 tablet  Daily     Magnesium 250 mg, Oral, Daily   metFORMIN -XR 500 MG  TAKE 2 TABLETS  TWICE  DAILY    metoprolol succ -XL 50 MG 24 hr tablet TAKE 1 TABLET  AT  BEDTIME   Multiple Vitamin  1 capsule  Daily   ENTRESTO 24-26 MG 1 tablet  2 times daily   spironolactone  25 mg Daily at bedtime   Tafamidis (VYNDAMAX) 61 MG CAPS 1 capsule  Daily   triamcinolone cream 0.1 % As needed   ASPERCREME) 10 % cream Daily PRN     Allergies  Allergen Reactions   Penicillins Other (See Comments)     Health Maintenance  Topic Date Due   COVID-19 Vacc (4-Booster for Pfizer series) 04/21/2021   INFLUENZA VACCINE  06/21/2021   FOOT EXAM  07/12/2021   Zoster Vaccines- Shingrix (2 of 2) 07/22/2021   OPHTHALMOLOGY EXAM  10/08/2021   HEMOGLOBIN A1C  11/16/2021   TETANUS/TDAP  02/08/2025   Pneumonia Vaccine - 23  Completed   HPV VACCINES  Aged Out    Immunization History  Administered Date(s) Administered   DT  02/09/2015   Influenza Split 08/07/2013   Influenza, High Dose  08/15/2018, 09/06/2019, 10/19/2020   PFIZER  SARS-COV-2 Vacc 12/11/2019, 01/01/2020, 02/24/2021   Pneumococcal 13 06/10/2016   Pneumococcal -23 08/13/2008   Td 11/22/2003   Zoster Recombinat (Shingrix) 05/27/2021    Last Colon - 05/01/2019 - Dr Adela Lank - recc 3-5 yr , but deferred to "age out"    Past Surgical History:  Procedure Laterality Date   CIRCUMCISION N/A 06/11/2019   Procedure: CIRCUMCISION ADULT;  Surgeon: Jerilee Field, MD;  Location: [facility];  Service: Urology;  Laterality: N/A;   COLONOSCOPY     INGUINAL HERNIA REPAIR Bilateral    1968 and 2004   LOOP RECORDER INSERTION N/A 07/28/2017   Procedure: LOOP RECORDER INSERTION;  Surgeon: Duke Salvia, MD;  Location: [facility] INVASIVE CV LAB;  Service: Cardiovascular;  Laterality: N/A;   TEE WITHOUT CARDIOVERSION N/A 07/28/2017   Procedure: TRANSESOPHAGEAL ECHOCARDIOGRAM (TEE);  Surgeon: Quintella Reichert, MD;  Location: [facility] ENDOSCOPY;  Service: Cardiovascular;  Laterality: N/A;     Family History  Problem Relation Age of Onset   Sickle cell anemia Mother    Diabetes Father    Stomach cancer Father    Colon cancer Father    CAD Neg Hx    Esophageal cancer Neg Hx    Rectal cancer Neg Hx      Social History   Tobacco  Use   Smoking status: Never   Smokeless tobacco: Never  Vaping Use   Vaping Use: Never used  Substance Use Topics   Alcohol use: No   Drug use: No      ROS Constitutional: Denies fever, chills, weight loss/gain, headaches, insomnia,  night sweats or change in appetite. Does c/o fatigue. Eyes: Denies redness, blurred vision, diplopia, discharge, itchy or watery eyes.  ENT: Denies discharge, congestion, post nasal drip, epistaxis, sore throat, earache, hearing loss, dental pain, Tinnitus, Vertigo, Sinus pain or snoring.  Cardio: Denies chest pain, palpitations, irregular heartbeat, syncope, dyspnea, diaphoresis, orthopnea, PND, claudication or edema Respiratory: denies cough, dyspnea, DOE, pleurisy, hoarseness, laryngitis or wheezing.  Gastrointestinal: Denies dysphagia, heartburn, reflux, water brash, pain, cramps, nausea, vomiting, bloating, diarrhea, constipation, hematemesis, melena, hematochezia, jaundice or hemorrhoids Genitourinary: Denies dysuria, frequency, urgency, nocturia, hesitancy, discharge, hematuria or flank pain Musculoskeletal: Denies arthralgia, myalgia, stiffness, Jt. Swelling, pain, limp or strain/sprain. He has had recent falls.  Skin: Denies puritis, rash, hives, warts, acne, eczema or change in skin lesion Neuro: No weakness, tremor, incoordination, spasms, paresthesia or pain Psychiatric: Denies confusion, memory loss or sensory loss. Denies Depression. Endocrine: Denies change in weight, skin, hair change, nocturia, and paresthesia, diabetic polys, visual blurring or hyper / hypo glycemic episodes.  Heme/Lymph: No excessive bleeding, bruising or enlarged lymph nodes.   Physical Exam  There were no vitals taken for this visit.  General Appearance: Well nourished and well groomed and in no apparent distress.  Eyes: PERRLA, EOMs, conjunctiva no swelling or erythema, normal fundi and vessels. Sinuses: No frontal/maxillary tenderness ENT/Mouth: EACs patent / TMs   nl. Nares clear without erythema, swelling, mucoid exudates. Oral hygiene is good. No erythema, swelling, or exudate. Tongue normal, non-obstructing. Tonsils not swollen or erythematous. Hearing normal.  Neck: Supple, thyroid not palpable. No bruits, nodes or JVD. Respiratory: Respiratory effort normal.  BS equal and clear bilateral without rales, rhonci, wheezing or stridor. Cardio: Heart sounds are normal with regular rate and rhythm and no murmurs, rubs or gallops. Pedal pulses are obscured 1+ pitting edema Rt>Lt.  No aortic or femoral bruits. Chest: symmetric with normal excursions and percussion.  Abdomen: Soft, with Nl bowel  sounds. Nontender, no guarding, rebound, hernias, masses, or organomegaly.  Lymphatics: Non tender without lymphadenopathy.  Musculoskeletal: Mild spastic Rt HP w/shuffling /limping gait using a cane for support.  Skin: Warm and dry without rashes, lesions, cyanosis, clubbing or  ecchymosis.  Neuro: Cranial nerves intact, reflexes equal bilaterally. Mild Rt spastic HP. Sensation to touch & Vibratory  decreased below mid shin(s) .  Pysch: Alert and oriented x 3 with normal affect, insight and judgment appropriate.   Assessment and Plan  1. Annual Preventative/Screening Exam    2. Essential hypertension  - EKG 12-Lead - [place], RETROPERITNL ABD,  LTD - Urinalysis, Routine w reflex microscopic - Microalbumin / creatinine urine ratio - CBC with Differential/Platelet - COMPLETE METABOLIC PANEL WITH GFR - Magnesium - TSH  3. Hyperlipidemia associated with type 2 diabetes mellitus (HCC)  - EKG 12-Lead - [place], RETROPERITNL ABD,  LTD - Lipid panel - TSH  4. Type 2 diabetes mellitus with stage 2 chronic kidney  disease, without long-term current use of insulin (HCC)  - EKG 12-Lead - [place], RETROPERITNL ABD,  LTD - Urinalysis, Routine w reflex microscopic - Microalbumin / creatinine urine ratio - COMPLETE METABOLIC PANEL WITH GFR - Hemoglobin A1c - Insulin,  random  5. Vitamin D deficiency  - VITAMIN D 25 Hydroxy   6. Hypothyroidism  - TSH  7. Paroxysmal atrial fibrillation (HCC)  - EKG 12-Lead - TSH  8. Chronic systolic heart failure (HCC)  - EKG 12-Lead - Lipid panel  9. MGUS (monoclonal gammopathy of unknown significance)  - CBC with Differential/Platelet - COMPLETE METABOLIC PANEL WITH GFR  10. History of CVA with residual deficit  - Lipid panel  11. Abdominal aortic atherosclerosis (HCC) by Abd CTscvn  2016  - EKG 12-Lead - [place], RETROPERITNL ABD,  LTD - Lipid panel  12. 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 Screening for colorectal cancer  - POC Hemoccult Bld/Stl   15. Prostate cancer screening  - PSA  16. FHx: heart disease  - EKG 12-Lead - [place], RETROPERITNL ABD,  LTD  17. Screening for AAA (aortic abdominal aneurysm)  - [place], RETROPERITNL ABD,  LTD  18. Chronic anticoagulation  - Urinalysis, Routine w reflex microscopic - Microalbumin / creatinine urine ratio - CBC with Differential/Platelet - COMPLETE METABOLIC PANEL WITH GFR - Magnesium - Lipid panel - TSH - Hemoglobin A1c - Insulin, random - VITAMIN D 25 Hydroxy          Patient was counseled in prudent diet, weight control to achieve/maintain BMI less than 25, BP monitoring, regular exercise and medications as discussed.  Discussed med effects and SE's. Routine screening labs and tests as requested with regular follow-up as recommended. Over 40 minutes of exam, counseling, chart review and high complex critical decision making was performed   Earl Maw, MD

## 2023-09-28 DIAGNOSIS — N182 Chronic kidney disease, stage 2 (mild): Secondary | ICD-10-CM | POA: Diagnosis not present

## 2023-09-28 DIAGNOSIS — L97821 Non-pressure chronic ulcer of other part of left lower leg limited to breakdown of skin: Secondary | ICD-10-CM | POA: Diagnosis not present

## 2023-09-28 DIAGNOSIS — E1122 Type 2 diabetes mellitus with diabetic chronic kidney disease: Secondary | ICD-10-CM | POA: Diagnosis not present

## 2023-09-28 DIAGNOSIS — E1141 Type 2 diabetes mellitus with diabetic mononeuropathy: Secondary | ICD-10-CM | POA: Diagnosis not present

## 2023-09-28 DIAGNOSIS — I69351 Hemiplegia and hemiparesis following cerebral infarction affecting right dominant side: Secondary | ICD-10-CM | POA: Diagnosis not present

## 2023-09-28 DIAGNOSIS — I872 Venous insufficiency (chronic) (peripheral): Secondary | ICD-10-CM | POA: Diagnosis not present

## 2023-09-29 DIAGNOSIS — E1122 Type 2 diabetes mellitus with diabetic chronic kidney disease: Secondary | ICD-10-CM | POA: Diagnosis not present

## 2023-09-29 DIAGNOSIS — N182 Chronic kidney disease, stage 2 (mild): Secondary | ICD-10-CM | POA: Diagnosis not present

## 2023-09-29 DIAGNOSIS — L97821 Non-pressure chronic ulcer of other part of left lower leg limited to breakdown of skin: Secondary | ICD-10-CM | POA: Diagnosis not present

## 2023-09-29 DIAGNOSIS — I872 Venous insufficiency (chronic) (peripheral): Secondary | ICD-10-CM | POA: Diagnosis not present

## 2023-09-29 DIAGNOSIS — E1141 Type 2 diabetes mellitus with diabetic mononeuropathy: Secondary | ICD-10-CM | POA: Diagnosis not present

## 2023-09-29 DIAGNOSIS — I69351 Hemiplegia and hemiparesis following cerebral infarction affecting right dominant side: Secondary | ICD-10-CM | POA: Diagnosis not present

## 2023-10-02 DIAGNOSIS — I69351 Hemiplegia and hemiparesis following cerebral infarction affecting right dominant side: Secondary | ICD-10-CM | POA: Diagnosis not present

## 2023-10-02 DIAGNOSIS — E1141 Type 2 diabetes mellitus with diabetic mononeuropathy: Secondary | ICD-10-CM | POA: Diagnosis not present

## 2023-10-02 DIAGNOSIS — L97821 Non-pressure chronic ulcer of other part of left lower leg limited to breakdown of skin: Secondary | ICD-10-CM | POA: Diagnosis not present

## 2023-10-02 DIAGNOSIS — I872 Venous insufficiency (chronic) (peripheral): Secondary | ICD-10-CM | POA: Diagnosis not present

## 2023-10-02 DIAGNOSIS — N182 Chronic kidney disease, stage 2 (mild): Secondary | ICD-10-CM | POA: Diagnosis not present

## 2023-10-02 DIAGNOSIS — E1122 Type 2 diabetes mellitus with diabetic chronic kidney disease: Secondary | ICD-10-CM | POA: Diagnosis not present

## 2023-10-03 ENCOUNTER — Ambulatory Visit (INDEPENDENT_AMBULATORY_CARE_PROVIDER_SITE_OTHER): Payer: Medicare Other | Admitting: Internal Medicine

## 2023-10-03 ENCOUNTER — Encounter: Payer: Self-pay | Admitting: Internal Medicine

## 2023-10-03 VITALS — BP 120/70 | HR 79 | Temp 97.9°F | Resp 16 | Ht 71.0 in

## 2023-10-03 DIAGNOSIS — R2681 Unsteadiness on feet: Secondary | ICD-10-CM | POA: Diagnosis not present

## 2023-10-03 DIAGNOSIS — Z9181 History of falling: Secondary | ICD-10-CM | POA: Diagnosis not present

## 2023-10-03 DIAGNOSIS — I872 Venous insufficiency (chronic) (peripheral): Secondary | ICD-10-CM | POA: Diagnosis not present

## 2023-10-03 DIAGNOSIS — I10 Essential (primary) hypertension: Secondary | ICD-10-CM | POA: Diagnosis not present

## 2023-10-03 DIAGNOSIS — R531 Weakness: Secondary | ICD-10-CM

## 2023-10-03 DIAGNOSIS — N182 Chronic kidney disease, stage 2 (mild): Secondary | ICD-10-CM | POA: Diagnosis not present

## 2023-10-03 DIAGNOSIS — L97821 Non-pressure chronic ulcer of other part of left lower leg limited to breakdown of skin: Secondary | ICD-10-CM | POA: Diagnosis not present

## 2023-10-03 DIAGNOSIS — E1122 Type 2 diabetes mellitus with diabetic chronic kidney disease: Secondary | ICD-10-CM | POA: Diagnosis not present

## 2023-10-03 DIAGNOSIS — E1141 Type 2 diabetes mellitus with diabetic mononeuropathy: Secondary | ICD-10-CM | POA: Diagnosis not present

## 2023-10-03 DIAGNOSIS — I69351 Hemiplegia and hemiparesis following cerebral infarction affecting right dominant side: Secondary | ICD-10-CM | POA: Diagnosis not present

## 2023-10-03 NOTE — Patient Instructions (Signed)
Due to recent changes in healthcare laws, you may see the results of your imaging and laboratory studies on MyChart before your provider has had a chance to review them.  We understand that in some cases there may be results that are confusing or concerning to you. Not all laboratory results come back in the same time frame and the provider may be waiting for multiple results in order to interpret others.  Please give us 48 hours in order for your provider to thoroughly review all the results before contacting the office for clarification of your results.  ++++++++++++++++++++++++++  Vit D  & Vit C 1,000 mg   are recommended to help protect  against the Covid-19 and other Corona viruses.    Also it's recommended  to take  Zinc 50 mg  to help  protect against the Covid-19   and best place to get  is also on [employer]  and don't pay more than 6-8 cents /pill !   +++++++++++++++++++++++++++++++++++++++ Recommend Adult Low Dose Aspirin or  coated  Aspirin 81 mg daily  To reduce risk of Colon Cancer 40 %,  Skin Cancer 26 % ,  Melanoma 46%  and  Pancreatic cancer 60% +++++++++++++++++++++++++++++++++++++++++ Vitamin D goal  is between 70-100.  Please make sure that you are taking your Vitamin D as directed.  It is very important as a natural anti-inflammatory  helping hair, skin, and nails, as well as reducing stroke and heart attack risk.  It helps your bones and helps with mood. It also decreases numerous cancer risks so please take it as directed.  Low Vit D is associated with a 200-300% higher risk for CANCER  and 200-300% higher risk for HEART   ATTACK  &  STROKE.   ...................................... It is also associated with higher death rate at younger ages,  autoimmune diseases like Rheumatoid arthritis, Lupus, Multiple Sclerosis.    Also many other serious conditions, like depression, Alzheimer's Dementia, infertility, muscle aches, fatigue, fibromyalgia - just to name  a few. +++++++++++++++++++++++++++++++++++++++++ Recommend the book "The END of DIETING" by Dr Joel Fuhrman  & the book "The END of DIABETES " by Dr Joel Fuhrman At [employer] - get book & Audio CD's    Being diabetic has a  300% increased risk for heart attack, stroke, cancer, and alzheimer- type vascular dementia. It is very important that you work harder with diet by avoiding all foods that are white. Avoid white rice (brown & wild rice is OK), white potatoes (sweetpotatoes in moderation is OK), White bread or wheat bread or anything made out of white flour like bagels, donuts, rolls, buns, biscuits, cakes, pastries, cookies, pizza crust, and pasta (made from white flour & egg whites) - vegetarian pasta or spinach or wheat pasta is OK. Multigrain breads like Arnold's or [employer], or multigrain sandwich thins or flatbreads.  Diet, exercise and weight loss can reverse and cure diabetes in the early stages.  Diet, exercise and weight loss is very important in the control and prevention of complications of diabetes which affects every system in your body, ie. Brain - dementia/stroke, eyes - glaucoma/blindness, heart - heart attack/heart failure, kidneys - dialysis, stomach - gastric paralysis, intestines - malabsorption, nerves - severe painful neuritis, circulation - gangrene & loss of a leg(s), and finally cancer and Alzheimers.    I recommend avoid fried & greasy foods,  sweets/candy, white rice (brown or wild rice or Quinoa is OK), white potatoes (sweet potatoes are OK) - anything   made from white flour - bagels, doughnuts, rolls, buns, biscuits,white and wheat breads, pizza crust and traditional pasta made of white flour & egg white(vegetarian pasta or spinach or wheat pasta is OK).  Multi-grain bread is OK - like multi-grain flat bread or sandwich thins. Avoid alcohol in excess. Exercise is also important.    Eat all the vegetables you want - avoid meat, especially red meat and dairy - especially  cheese.  Cheese is the most concentrated form of trans-fats which is the worst thing to clog up our arteries. Veggie cheese is OK which can be found in the fresh produce section at [employer] or [employer] or [employer]  +++++++++++++++++++++++++++++++++++++++ DASH Eating Plan  DASH stands for "Dietary Approaches to Stop Hypertension."   The DASH eating plan is a healthy eating plan that has been shown to reduce high blood pressure (hypertension). Additional health benefits may include reducing the risk of type 2 diabetes mellitus, heart disease, and stroke. The DASH eating plan may also help with weight loss. WHAT DO I NEED TO KNOW ABOUT THE DASH EATING PLAN? For the DASH eating plan, you will follow these general guidelines: Choose foods with a percent daily value for sodium of less than 5% (as listed on the food label). Use salt-free seasonings or herbs instead of table salt or sea salt. Check with your health care provider or pharmacist before using salt substitutes. Eat lower-sodium products, often labeled as "lower sodium" or "no salt added." Eat fresh foods. Eat more vegetables, fruits, and low-fat dairy products. Choose whole grains. Look for the word "whole" as the first word in the ingredient list. Choose fish  Limit sweets, desserts, sugars, and sugary drinks. Choose heart-healthy fats. Eat veggie cheese  Eat more home-cooked food and less restaurant, buffet, and fast food. Limit fried foods. Cook foods using methods other than frying. Limit canned vegetables. If you do use them, rinse them well to decrease the sodium. When eating at a restaurant, ask that your food be prepared with less salt, or no salt if possible.                      WHAT FOODS CAN I EAT? Read Dr Joel Fuhrman's books on The End of Dieting & The End of Diabetes  Grains Whole grain or whole wheat bread. Brown rice. Whole grain or whole wheat pasta. Quinoa, bulgur, and whole grain cereals. Low-sodium  cereals. Corn or whole wheat flour tortillas. Whole grain cornbread. Whole grain crackers. Low-sodium crackers.  Vegetables Fresh or frozen vegetables (raw, steamed, roasted, or grilled). Low-sodium or reduced-sodium tomato and vegetable juices. Low-sodium or reduced-sodium tomato sauce and paste. Low-sodium or reduced-sodium canned vegetables.   Fruits All fresh, canned (in natural juice), or frozen fruits.  Protein Products  All fish and seafood.  Dried beans, peas, or lentils. Unsalted nuts and seeds. Unsalted canned beans.  Dairy Low-fat dairy products, such as skim or 1% milk, 2% or reduced-fat cheeses, low-fat ricotta or cottage cheese, or plain low-fat yogurt. Low-sodium or reduced-sodium cheeses.  Fats and Oils Tub margarines without trans fats. Light or reduced-fat mayonnaise and salad dressings (reduced sodium). Avocado. Safflower, olive, or canola oils. Natural peanut or almond butter.  Other Unsalted popcorn and pretzels. The items listed above may not be a complete list of recommended foods or beverages. Contact your dietitian for more options.  +++++++++++++++  WHAT FOODS ARE NOT RECOMMENDED? Grains/ White flour or wheat flour White bread. White pasta. White rice. Refined   cornbread. Bagels and croissants. Crackers that contain trans fat.  Vegetables  Creamed or fried vegetables. Vegetables in a . Regular canned vegetables. Regular canned tomato sauce and paste. Regular tomato and vegetable juices.  Fruits Dried fruits. Canned fruit in light or heavy syrup. Fruit juice.  Meat and Other Protein Products Meat in general - RED meat & White meat.  Fatty cuts of meat. Ribs, chicken wings, all processed meats as bacon, sausage, bologna, salami, fatback, hot dogs, bratwurst and packaged luncheon meats.  Dairy Whole or 2% milk, cream, half-and-half, and cream cheese. Whole-fat or sweetened yogurt. Full-fat cheeses or blue cheese. Non-dairy creamers and whipped toppings.  Processed cheese, cheese spreads, or cheese curds.  Condiments Onion and garlic salt, seasoned salt, table salt, and sea salt. Canned and packaged gravies. Worcestershire sauce. Tartar sauce. Barbecue sauce. Teriyaki sauce. Soy sauce, including reduced sodium. Steak sauce. Fish sauce. Oyster sauce. Cocktail sauce. Horseradish. Ketchup and mustard. Meat flavorings and tenderizers. Bouillon cubes. Hot sauce. Tabasco sauce. Marinades. Taco seasonings. Relishes.  Fats and Oils Butter, stick margarine, lard, shortening and bacon fat. Coconut, palm kernel, or palm oils. Regular salad dressings.  Pickles and olives. Salted popcorn and pretzels.  The items listed above may not be a complete list of foods and beverages to avoid.  

## 2023-10-03 NOTE — Progress Notes (Signed)
Future Appointments  Date Time Provider Department  10/03/2023  4:00 PM Earl Cowboy, MD [facility]  10/09/2023  1:15 PM Earl [name], DPM [facility]  01/02/2024                      wellness  3:30 PM Earl Dick, NP [facility]  01/10/2024  3:45 PM Earl [name], DPM [facility]  02/26/2024  3:30 PM Earl Chard, DO [facility]  04/09/2024                      cpe  3:30 PM Earl Cowboy, MD [facility]  07/10/2024  4:00 PM Earl Dick, NP [facility]  10/15/2024  3:00 PM Earl Cowboy, MD [facility]    History of Present Illness:      This very nice 82 y.o. [name]  with HTN, ASCVD/Hx CVA, HLD, T2_NIDDM  and Vitamin D Deficiency  returns for follow-up for daughter's concern for his unstable gait & generalized general decline. Patient arrived 40 minutes late for his scheduled CPE 1 week ago & it unfortunately had to be rescheduled.    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 Patient is accompanied today by his daughter Earl [name] from [place] who plans to move in with her father to support his care .        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  He has persistent Rt HP and ataxic gait and is followed by Dr Patel/ Neurology who has ordered home PT  1 x /week for him. Patient is followed By Dr Christie Beckers for Cardiac Amylosis and Chronic systolic CHF( Non -Ischemic).  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   apixaban (ELIQUIS) 5 MG  TAKE 1 TABLET TWICE DAILY    atorvastatin 40 MG tablet Take  1 tablet  Daily    baclofen 10 MG tablet Take 0.5 tablets 2 times daily.   VITAMIN D  Take 1 tablet  daily.     LOTRISONE  cream Apply to affected area 2 to 4 x / day   B-12 50 MCG TABS Take  daily.   AQUAPHOR OINT Apply small amount to two areas on buttocks  daily    ferrous sulfate 325 (65 FE) MG tablet Take  at bedtime.   finasteride 5 MG tablet TAKE 1 TABLET  DAILY    glipiZIDE 5 MG tablet Patient takes 0.5 tablet twice a day.   levothyroxine 50 MCG tablet TAKE 1 TABLET  DAILY   Magnesium 250 MG TABS Take daily.    metFORMIN-XR 500 MG  TWICE  DAILY WITH MEALS    metoprolol succinate -XL) 50 MG  TAKE 1 TABLET  AT  BEDTIME   Multiple Vitamin Take 1 capsule  daily.   potassium chloride  20 MEQ tablet Take 1 tablet daily   sacubitril-valsartan (ENTRESTO) 24-26 MG Take 1 tablet 2  times daily.   spironolactone (ALDACTONE) 25 MG tablet       Tafamidis (VYNDAMAX) 61 MG CAPS    torsemide (DEMADEX) 20 MG tablet Take  1 tablet   2 x /day     triamcinolone cream 0.1 % Apply 1 Application topically as needed.   10 % cream  Apply 1 application topically daily as needed for muscle pain.   VITAMIN A   3000 mg Take    vitamin C  500 MG tablet Take 500 mg daily.     Allergies  Allergen Reactions   Penicillins Other (See Comments)    Did it involve swelling of the face/tongue/throat, SOB, or low BP? Unknown Did it involve sudden or severe rash/hives, skin peeling, or any reaction on the inside of your mouth or nose? Unknown Did you need to seek medical attention at a hospital or doctor's office? No When did it last happen?    Over 53 Years Ago   If all above answers are "NO", may proceed with cephalosporin use.       Problem list He has Paroxysmal atrial fibrillation (HCC); MGUS (monoclonal gammopathy of unknown significance); Essential hypertension; Hyperlipidemia associated with type 2 diabetes mellitus (HCC); Type 2 diabetes mellitus with stage 2 chronic kidney disease, without long-term current use of insulin (HCC); BPH (benign prostatic hyperplasia); Vitamin D deficiency; Hypothyroidism; History of CVA with residual deficit; Ataxia, post-stroke; Chronic anticoagulation; CKD stage 2 due to type 2 diabetes mellitus (HCC); Late effect of  cerebrovascular accident (CVA); Acquired thrombophilia (HCC); Abdominal aortic atherosclerosis (HCC) by Abd CTscvn  2016; Neurologic gait disorder; Diabetic 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 (HCC); Iron deficiency anemia; Acute on chronic congestive heart failure (HCC); NICM (nonischemic cardiomyopathy) (HCC); and Chronic systolic heart failure (HCC) on their problem list.   Observations/Objective:  BP 120/70   Pulse 79   Temp 97.9 F (36.6 C)   Resp 16   Ht 5\' 11"  (1.803 m)   SpO2 97%   BMI 21.20 kg/m   HEENT - WNL. Neck - supple.  Chest - Clear equal BS. Cor - Nl HS. RRR w/o sig MGR. PP 1(+). No edema. MS- FROM w/o deformities.  Generalized weakness with decrease power, tone & muscle bulk. Gait supported by a rollator. (+) Gowers - unable to arise from a sitting to standing position with 4+ max assistance from daughter & myself.  Neuro -  Nl w/o focal abnormalities. Decreased sensation & vibratory distal LE's to feet.    Assessment and Plan:   1. Essential hypertension   2. Diabetic 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 (HCC)   3. Unstable gait   4. At high risk for falls   5. Weakness   Rx written for  wheel chair & Adult Depends for his incontinence.    Follow Up Instructions:        I discussed the assessment and treatment plan with the patient & his  daughter. They were provided an opportunity to ask questions and all were answered. They agreed with the plan and demonstrated an understanding of the instructions.       They  were advised to call back or seek an in-person evaluation if the symptoms worsen or if the condition fails to improve as anticipated. Over 35  minutes of patient exam, counseling,  chart review and critical decision making was performed.      Earl Maw, MD

## 2023-10-05 ENCOUNTER — Other Ambulatory Visit: Payer: Self-pay

## 2023-10-05 DIAGNOSIS — L97821 Non-pressure chronic ulcer of other part of left lower leg limited to breakdown of skin: Secondary | ICD-10-CM | POA: Diagnosis not present

## 2023-10-05 DIAGNOSIS — I872 Venous insufficiency (chronic) (peripheral): Secondary | ICD-10-CM | POA: Diagnosis not present

## 2023-10-05 DIAGNOSIS — E1141 Type 2 diabetes mellitus with diabetic mononeuropathy: Secondary | ICD-10-CM | POA: Diagnosis not present

## 2023-10-05 DIAGNOSIS — I69351 Hemiplegia and hemiparesis following cerebral infarction affecting right dominant side: Secondary | ICD-10-CM | POA: Diagnosis not present

## 2023-10-05 DIAGNOSIS — N182 Chronic kidney disease, stage 2 (mild): Secondary | ICD-10-CM | POA: Diagnosis not present

## 2023-10-05 DIAGNOSIS — E1122 Type 2 diabetes mellitus with diabetic chronic kidney disease: Secondary | ICD-10-CM | POA: Diagnosis not present

## 2023-10-09 ENCOUNTER — Encounter: Payer: Self-pay | Admitting: Neurology

## 2023-10-09 ENCOUNTER — Encounter: Payer: Self-pay | Admitting: Podiatry

## 2023-10-09 ENCOUNTER — Ambulatory Visit (INDEPENDENT_AMBULATORY_CARE_PROVIDER_SITE_OTHER): Payer: Medicare Other | Admitting: Podiatry

## 2023-10-09 DIAGNOSIS — L84 Corns and callosities: Secondary | ICD-10-CM | POA: Diagnosis not present

## 2023-10-09 DIAGNOSIS — B351 Tinea unguium: Secondary | ICD-10-CM | POA: Diagnosis not present

## 2023-10-09 DIAGNOSIS — E1142 Type 2 diabetes mellitus with diabetic polyneuropathy: Secondary | ICD-10-CM

## 2023-10-09 DIAGNOSIS — M79676 Pain in unspecified toe(s): Secondary | ICD-10-CM

## 2023-10-09 NOTE — Progress Notes (Signed)
Subjective:  Patient ID: Earl [name], male    DOB: 1941/03/06,  MRN: [id number]  82 y.o. male presents to clinic with  at risk foot care with history of diabetic neuropathy and preulcerative lesion(s) left foot and painful mycotic toenails that limit ambulation. Painful toenails interfere with ambulation. Aggravating factors include wearing enclosed shoe gear. Pain is relieved with periodic professional debridement. Painful preulcerative lesion(s) is/are aggravated when weightbearing with and without shoegear. Pain is relieved with periodic professional debridement.   He is accompanied by his daughter on today's visit.   New problem(s): None   PCP is [name], MD.  Allergies  Allergen Reactions   Penicillins Other (See Comments)    Did it involve swelling of the face/tongue/throat, SOB, or low BP? Unknown Did it involve sudden or severe rash/hives, skin peeling, or any reaction on the inside of your mouth or nose? Unknown Did you need to seek medical attention at a hospital or doctor's office? No When did it last happen?    Over 41 Years Ago   If all above answers are "NO", may proceed with cephalosporin use.     Review of Systems: Negative except as noted in the HPI.   Objective:  Earl [name] is a pleasant 82 y.o. male WD, WN in NAD. AAO x 3.  Vascular Examination: Vascular status intact b/l with palpable pedal pulses. CFT <3 seconds b/l. No edema. No pain with calf compression b/l. Skin temperature gradient WNL b/l. Wearing bilateral lower extremity compression wraps present which are clean, dry and intact.  Neurological Examination: Sensation grossly intact b/l with 10 gram monofilament. Vibratory sensation intact b/l.   Dermatological Examination: Pedal skin is warm and supple b/l LE.  No interdigital macerations noted b/l LE.   Toenails 1-5 bilaterally elongated, discolored, dystrophic, thickened, and crumbly with subungual debris and tenderness to  dorsal palpation.   Preulcerative lesion noted distal tip of left hallux with subdermal hemorrhage No erythema, no edema, no drainage, no fluctuance.  Musculoskeletal:  Muscle strength 5/5 to all lower extremity muscle groups bilaterally. No pain, crepitus or joint limitation noted with ROM bilateral LE.  Radiographs: None  Last A1c:      Latest Ref Rng & Units 07/19/2023   12:21 PM 03/21/2023    4:12 PM 12/14/2022    5:11 PM  Hemoglobin A1C  Hemoglobin-A1c <5.7 % of total Hgb 6.4  6.3  6.3    Assessment:   1. Pain due to onychomycosis of toenail   2. Pre-ulcerative calluses   3. Diabetic polyneuropathy associated with type 2 diabetes mellitus (HCC)    Plan:  -Patient was evaluated today. All questions/concerns addressed on today's visit. -Patient's family member present. All questions/concerns addressed on today's visit. -Continue supportive shoe gear daily. -Mycotic toenails 1-5 bilaterally were debrided in length and girth with sterile nail nippers and dremel without incident. -Preulcerative lesion pared L hallux utilizing sterile scalpel blade. Total number pared=1. -Patient/POA to call should there be question/concern in the interim.  Return in about 3 months (around 01/09/2024).  [name], DPM      [facility] LOCATION: [address], [place] [address]  Office [phone]   [facility] LOCATION: [address] [place], [place] [address] Office [phone]

## 2023-10-10 DIAGNOSIS — L97821 Non-pressure chronic ulcer of other part of left lower leg limited to breakdown of skin: Secondary | ICD-10-CM | POA: Diagnosis not present

## 2023-10-10 DIAGNOSIS — N182 Chronic kidney disease, stage 2 (mild): Secondary | ICD-10-CM | POA: Diagnosis not present

## 2023-10-10 DIAGNOSIS — I872 Venous insufficiency (chronic) (peripheral): Secondary | ICD-10-CM | POA: Diagnosis not present

## 2023-10-10 DIAGNOSIS — E1141 Type 2 diabetes mellitus with diabetic mononeuropathy: Secondary | ICD-10-CM | POA: Diagnosis not present

## 2023-10-10 DIAGNOSIS — I69351 Hemiplegia and hemiparesis following cerebral infarction affecting right dominant side: Secondary | ICD-10-CM | POA: Diagnosis not present

## 2023-10-10 DIAGNOSIS — E1122 Type 2 diabetes mellitus with diabetic chronic kidney disease: Secondary | ICD-10-CM | POA: Diagnosis not present

## 2023-10-11 ENCOUNTER — Ambulatory Visit
Admission: RE | Admit: 2023-10-11 | Discharge: 2023-10-11 | Disposition: A | Discharge status: Home / Self Care | Payer: Medicare Other | Source: Ambulatory Visit | Attending: Neurology | Admitting: Neurology

## 2023-10-11 DIAGNOSIS — R292 Abnormal reflex: Secondary | ICD-10-CM

## 2023-10-11 DIAGNOSIS — I6389 Other cerebral infarction: Secondary | ICD-10-CM | POA: Diagnosis not present

## 2023-10-11 DIAGNOSIS — R2681 Unsteadiness on feet: Secondary | ICD-10-CM

## 2023-10-11 DIAGNOSIS — G319 Degenerative disease of nervous system, unspecified: Secondary | ICD-10-CM | POA: Diagnosis not present

## 2023-10-11 DIAGNOSIS — I6782 Cerebral ischemia: Secondary | ICD-10-CM | POA: Diagnosis not present

## 2023-10-11 DIAGNOSIS — E854 Organ-limited amyloidosis: Secondary | ICD-10-CM

## 2023-10-12 DIAGNOSIS — E1141 Type 2 diabetes mellitus with diabetic mononeuropathy: Secondary | ICD-10-CM | POA: Diagnosis not present

## 2023-10-12 DIAGNOSIS — E1122 Type 2 diabetes mellitus with diabetic chronic kidney disease: Secondary | ICD-10-CM | POA: Diagnosis not present

## 2023-10-12 DIAGNOSIS — I69351 Hemiplegia and hemiparesis following cerebral infarction affecting right dominant side: Secondary | ICD-10-CM | POA: Diagnosis not present

## 2023-10-12 DIAGNOSIS — N182 Chronic kidney disease, stage 2 (mild): Secondary | ICD-10-CM | POA: Diagnosis not present

## 2023-10-12 DIAGNOSIS — L97821 Non-pressure chronic ulcer of other part of left lower leg limited to breakdown of skin: Secondary | ICD-10-CM | POA: Diagnosis not present

## 2023-10-12 DIAGNOSIS — I872 Venous insufficiency (chronic) (peripheral): Secondary | ICD-10-CM | POA: Diagnosis not present

## 2023-10-12 MED ORDER — BACLOFEN 5 MG PO TABS: 5.0000 mg | ORAL_TABLET | Freq: Two times a day (BID) | ORAL | 5 refills | Status: DC

## 2023-10-13 ENCOUNTER — Encounter: Payer: Self-pay | Admitting: Podiatry

## 2023-10-16 ENCOUNTER — Telehealth: Payer: Self-pay | Admitting: Neurology

## 2023-10-16 DIAGNOSIS — L97821 Non-pressure chronic ulcer of other part of left lower leg limited to breakdown of skin: Secondary | ICD-10-CM | POA: Diagnosis not present

## 2023-10-16 DIAGNOSIS — E1122 Type 2 diabetes mellitus with diabetic chronic kidney disease: Secondary | ICD-10-CM | POA: Diagnosis not present

## 2023-10-16 DIAGNOSIS — I872 Venous insufficiency (chronic) (peripheral): Secondary | ICD-10-CM | POA: Diagnosis not present

## 2023-10-16 DIAGNOSIS — E1141 Type 2 diabetes mellitus with diabetic mononeuropathy: Secondary | ICD-10-CM | POA: Diagnosis not present

## 2023-10-16 DIAGNOSIS — N182 Chronic kidney disease, stage 2 (mild): Secondary | ICD-10-CM | POA: Diagnosis not present

## 2023-10-16 DIAGNOSIS — I69351 Hemiplegia and hemiparesis following cerebral infarction affecting right dominant side: Secondary | ICD-10-CM | POA: Diagnosis not present

## 2023-10-16 NOTE — Telephone Encounter (Signed)
Ok to give orders

## 2023-10-16 NOTE — Telephone Encounter (Signed)
[facility] and provided her with verbal orders. Gabriel Rung thanked [place] for the call.

## 2023-10-16 NOTE — Telephone Encounter (Signed)
[facility] called in and left a message with the access nurse. Caller states she needs re certification orders for continued physical therapy

## 2023-10-18 DIAGNOSIS — I69351 Hemiplegia and hemiparesis following cerebral infarction affecting right dominant side: Secondary | ICD-10-CM | POA: Diagnosis not present

## 2023-10-18 DIAGNOSIS — N182 Chronic kidney disease, stage 2 (mild): Secondary | ICD-10-CM | POA: Diagnosis not present

## 2023-10-18 DIAGNOSIS — E1141 Type 2 diabetes mellitus with diabetic mononeuropathy: Secondary | ICD-10-CM | POA: Diagnosis not present

## 2023-10-18 DIAGNOSIS — E1122 Type 2 diabetes mellitus with diabetic chronic kidney disease: Secondary | ICD-10-CM | POA: Diagnosis not present

## 2023-10-18 DIAGNOSIS — I872 Venous insufficiency (chronic) (peripheral): Secondary | ICD-10-CM | POA: Diagnosis not present

## 2023-10-18 DIAGNOSIS — L97821 Non-pressure chronic ulcer of other part of left lower leg limited to breakdown of skin: Secondary | ICD-10-CM | POA: Diagnosis not present

## 2023-10-19 ENCOUNTER — Encounter: Payer: Self-pay | Admitting: Nurse Practitioner

## 2023-10-20 DIAGNOSIS — I69351 Hemiplegia and hemiparesis following cerebral infarction affecting right dominant side: Secondary | ICD-10-CM | POA: Diagnosis not present

## 2023-10-20 DIAGNOSIS — N182 Chronic kidney disease, stage 2 (mild): Secondary | ICD-10-CM | POA: Diagnosis not present

## 2023-10-20 DIAGNOSIS — L97821 Non-pressure chronic ulcer of other part of left lower leg limited to breakdown of skin: Secondary | ICD-10-CM | POA: Diagnosis not present

## 2023-10-20 DIAGNOSIS — E1122 Type 2 diabetes mellitus with diabetic chronic kidney disease: Secondary | ICD-10-CM | POA: Diagnosis not present

## 2023-10-20 DIAGNOSIS — I872 Venous insufficiency (chronic) (peripheral): Secondary | ICD-10-CM | POA: Diagnosis not present

## 2023-10-20 DIAGNOSIS — E1141 Type 2 diabetes mellitus with diabetic mononeuropathy: Secondary | ICD-10-CM | POA: Diagnosis not present

## 2023-10-21 ENCOUNTER — Other Ambulatory Visit: Payer: Self-pay | Admitting: Nurse Practitioner

## 2023-10-21 DIAGNOSIS — E1122 Type 2 diabetes mellitus with diabetic chronic kidney disease: Secondary | ICD-10-CM

## 2023-10-21 MED ORDER — FREESTYLE LIBRE 3 SENSOR MISC: 3 refills | Status: DC

## 2023-10-21 MED ORDER — FREESTYLE LIBRE 3 READER DEVI: 1 refills | Status: DC

## 2023-10-23 ENCOUNTER — Encounter: Payer: Self-pay | Admitting: Neurology

## 2023-10-24 ENCOUNTER — Other Ambulatory Visit: Payer: Self-pay | Admitting: Cardiology

## 2023-10-24 ENCOUNTER — Encounter: Payer: Self-pay | Admitting: Cardiology

## 2023-10-24 DIAGNOSIS — I872 Venous insufficiency (chronic) (peripheral): Secondary | ICD-10-CM | POA: Diagnosis not present

## 2023-10-24 DIAGNOSIS — I69351 Hemiplegia and hemiparesis following cerebral infarction affecting right dominant side: Secondary | ICD-10-CM | POA: Diagnosis not present

## 2023-10-24 DIAGNOSIS — N182 Chronic kidney disease, stage 2 (mild): Secondary | ICD-10-CM | POA: Diagnosis not present

## 2023-10-24 DIAGNOSIS — E1122 Type 2 diabetes mellitus with diabetic chronic kidney disease: Secondary | ICD-10-CM | POA: Diagnosis not present

## 2023-10-24 DIAGNOSIS — E1141 Type 2 diabetes mellitus with diabetic mononeuropathy: Secondary | ICD-10-CM | POA: Diagnosis not present

## 2023-10-24 DIAGNOSIS — L97821 Non-pressure chronic ulcer of other part of left lower leg limited to breakdown of skin: Secondary | ICD-10-CM | POA: Diagnosis not present

## 2023-10-25 ENCOUNTER — Other Ambulatory Visit: Payer: Self-pay

## 2023-10-25 DIAGNOSIS — E119 Type 2 diabetes mellitus without complications: Secondary | ICD-10-CM | POA: Diagnosis not present

## 2023-10-25 DIAGNOSIS — H31001 Unspecified chorioretinal scars, right eye: Secondary | ICD-10-CM | POA: Diagnosis not present

## 2023-10-25 DIAGNOSIS — H5203 Hypermetropia, bilateral: Secondary | ICD-10-CM | POA: Diagnosis not present

## 2023-10-25 DIAGNOSIS — H52203 Unspecified astigmatism, bilateral: Secondary | ICD-10-CM | POA: Diagnosis not present

## 2023-10-25 NOTE — Progress Notes (Signed)
Specialty Pharmacy Refill Coordination Note  Earl [name] is a 82 y.o. male who's daughter, [name] was contacted today regarding refills of specialty medication(s) Tafamidis   Patient requested Delivery   Delivery date: 10/31/23   Verified address: [address]   Medication will be filled on 10/30/23.

## 2023-10-29 ENCOUNTER — Encounter: Payer: Self-pay | Admitting: Nurse Practitioner

## 2023-10-30 ENCOUNTER — Other Ambulatory Visit: Payer: Self-pay | Admitting: Nurse Practitioner

## 2023-10-30 ENCOUNTER — Other Ambulatory Visit: Payer: Self-pay

## 2023-10-30 ENCOUNTER — Other Ambulatory Visit (HOSPITAL_COMMUNITY): Payer: Self-pay | Admitting: Cardiology

## 2023-10-30 DIAGNOSIS — E1122 Type 2 diabetes mellitus with diabetic chronic kidney disease: Secondary | ICD-10-CM

## 2023-10-30 MED ORDER — GLIPIZIDE 2.5 MG PO TABS: 2.5000 mg | ORAL_TABLET | Freq: Three times a day (TID) | ORAL | 1 refills | Status: DC

## 2023-10-31 ENCOUNTER — Encounter: Payer: Self-pay | Admitting: Internal Medicine

## 2023-10-31 ENCOUNTER — Ambulatory Visit (INDEPENDENT_AMBULATORY_CARE_PROVIDER_SITE_OTHER): Payer: Medicare Other | Admitting: Internal Medicine

## 2023-10-31 VITALS — BP 100/58 | HR 73 | Temp 98.5°F | Ht 71.0 in | Wt 154.4 lb

## 2023-10-31 DIAGNOSIS — E785 Hyperlipidemia, unspecified: Secondary | ICD-10-CM

## 2023-10-31 DIAGNOSIS — E1169 Type 2 diabetes mellitus with other specified complication: Secondary | ICD-10-CM

## 2023-10-31 DIAGNOSIS — I480 Paroxysmal atrial fibrillation: Secondary | ICD-10-CM | POA: Diagnosis not present

## 2023-10-31 DIAGNOSIS — N182 Chronic kidney disease, stage 2 (mild): Secondary | ICD-10-CM

## 2023-10-31 DIAGNOSIS — E1141 Type 2 diabetes mellitus with diabetic mononeuropathy: Secondary | ICD-10-CM

## 2023-10-31 DIAGNOSIS — R627 Adult failure to thrive: Secondary | ICD-10-CM

## 2023-10-31 DIAGNOSIS — Z79899 Other long term (current) drug therapy: Secondary | ICD-10-CM | POA: Diagnosis not present

## 2023-10-31 DIAGNOSIS — E559 Vitamin D deficiency, unspecified: Secondary | ICD-10-CM | POA: Diagnosis not present

## 2023-10-31 DIAGNOSIS — Z23 Encounter for immunization: Secondary | ICD-10-CM

## 2023-10-31 DIAGNOSIS — I10 Essential (primary) hypertension: Secondary | ICD-10-CM | POA: Diagnosis not present

## 2023-10-31 DIAGNOSIS — E039 Hypothyroidism, unspecified: Secondary | ICD-10-CM | POA: Diagnosis not present

## 2023-10-31 DIAGNOSIS — D472 Monoclonal gammopathy: Secondary | ICD-10-CM | POA: Diagnosis not present

## 2023-10-31 DIAGNOSIS — E1122 Type 2 diabetes mellitus with diabetic chronic kidney disease: Secondary | ICD-10-CM

## 2023-10-31 DIAGNOSIS — E851 Neuropathic heredofamilial amyloidosis: Secondary | ICD-10-CM | POA: Diagnosis not present

## 2023-10-31 NOTE — Progress Notes (Signed)
[place]      ADULT   &   ADOLESCENT      INTERNAL MEDICINE  [name], M.D.          Rance Muir, ANP        Adela Glimpse, FNP  [facility] [address]  [place], [place]. [address] Telephone [phone] Telefax [phone]   History of Present Illness:   Future Appointments  Date Time Provider Department  10/31/2023  3:30 PM [name], MD [facility]  01/02/2024                  wellness  3:30 PM [name], NP [facility]  01/10/2024  3:45 PM [name], DPM [facility]  02/26/2024  3:30 PM Nita Sickle K, DO [facility]  04/09/2024                   6 mo ov  3:30 PM [name], MD [facility]  04/10/2024  1:45 PM [name], DPM [facility]  07/10/2024                   9 mo ov   4:00 PM [name], NP [facility]  10/15/2024                    cpe   3:00 PM [name], MD [facility]                                                        This very nice 82 y.o. [facility] presents for  follow up with HTN, HLD, Pre-Diabetes and Vitamin D Deficiency.   Patient is treated for HTN & BP has been controlled at home. Today's BP was 100/58. Patient has had no complaints of any cardiac type chest pain, palpitations, dyspnea / orthopnea / PND, dizziness, claudication, or dependent edema.                                                  Hyperlipidemia is controlled with diet & meds. Patient denies myalgias or other med SE's. Last Lipids were at goal  Lab Results  Component Value Date   CHOL 103 07/19/2023   HDL 52 07/19/2023   LDLCALC 39 07/19/2023   TRIG 42 07/19/2023   CHOLHDL 2.0 07/19/2023     Also, the patient has history of T2_NIDDM (1994) and has had no symptoms of reactive hypoglycemia, diabetic polys, paresthesias or visual blurring.  Last A1c was not at goal :  Lab Results  Component Value Date   HGBA1C 6.4 (H) 07/19/2023     Further, the patient also has history of Vitamin D Deficiency and  supplements vitamin D without any suspected side-effects. Last vitamin D was at goal :  Lab Results  Component Value Date   VD25OH 57 03/21/2023     Current Outpatient Medications on File Prior to Visit  Medication Sig   AMVUTTRA 25 MG/0.5ML syringe INJECT 25MG  (0.5ML) SUBCUTANEOUSLY ONCE EVERY 3 MONTHS   apixaban (ELIQUIS) 5 MG TABS tablet TAKE 1 TABLET   TWICE DAILY    atorvastatin 40 MG tablet Take  1 tablet  Daily f  baclofen 5 MG TABS Take 1 tablet  2 times daily.   VITAMIN D  Take 1 tablet  daily.    clotrimazole-betamethasone cream Apply to affected area 2 to 4 x / day   B-12 50 MCG TABS Take  daily.   Emollient AQUAPHOR ADV PROTECT HEALING OINT Apply to  two areas on buttocks daily    ferrous sulfate 325 (65 FE) MG tablet Take at bedtime.   finasteride 5 MG tablet TAKE 1 TABLET  DAILY FOR PROSTATE   glipiZIDE 2.5 MG TABS Take with breakfast, with lunch, and with evening meal.   levothyroxine  50 MCG tablet TAKE 1 TABLET DAILY   Magnesium 250 MG TABS Take daily.    metFORMIN-XR) 500 MG TAKE 2 TABLETS TWICE  DAILY    metoprolol succinate -XL 50 MG 24 hr tablet TAKE 1 TABLET   AT  BEDTIME   Multiple Vitamin  Take 1 capsule daily.   20 MEQ tablet Take 1 tablet  daily   sacubitril-valsartan (ENTRESTO) 24-26 MG Take 1 tablet 2  times daily.    25 MG tablet Take 1 tablet at bedtime.   Tafamidis (VYNDAMAX) 61 MG CAPS Take 1 capsule  daily.   Torsemide  20 MG tablet Take  1 tablet   2 x /day  for Fluid Retention & Leg / Ankle Swelling   triamcinolone cream  0.1 % Apptopically as needed.   ASPERCREME)10 % cream Apply 1 application topically daily as needed for muscle pain.   VITAMIN 3000 mg Take daily   vitamin C (ASCORBIC ACID) 500 MG tablet Take  daily.    Allergies  Allergen Reactions   Penicillins Unknown        PMHx:   Past Medical History:  Diagnosis Date   Adenomatous colon polyp    Allergy    Anal fissure    Arthritis    BPH (benign prostatic hyperplasia)     Diabetes mellitus (HCC)    GERD (gastroesophageal reflux disease)    Hyperlipidemia    Hypertension    Hypothyroidism    Other testicular hypofunction    Stroke (HCC)    Immunization History  Administered Date(s) Administered   DT (Pediatric) 02/09/2015   Influenza Split 08/07/2013   Influenza, High Dose Seasonal PF 08/19/2014, 08/28/2015, 09/13/2016, 08/15/2017, 08/15/2018, 09/06/2019, 10/19/2020, 09/12/2022   PFIZER Comirnaty(Gray Top)Covid-19 Tri-Sucrose Vaccine 08/28/2022   PFIZER(Purple Top)SARS-COV-2 Vaccination 12/11/2019, 01/01/2020, 02/24/2021   Pneumococcal Conjugate-13 06/10/2016   Pneumococcal Polysaccharide-23 08/13/2008   Td 11/22/2003   Zoster Recombinant(Shingrix) 05/27/2021   Past Surgical History:  Procedure Laterality Date   CIRCUMCISION N/A 06/11/2019   Procedure: CIRCUMCISION ADULT;  Surgeon: Jerilee Field, MD;  Location: [facility]S;  Service: Urology;  Laterality: N/A;   COLONOSCOPY     INGUINAL HERNIA REPAIR Bilateral    1968 and 2004   LOOP RECORDER INSERTION N/A 07/28/2017   Procedure: LOOP RECORDER INSERTION;  Surgeon: Duke Salvia, MD;  Location: [facility] INVASIVE CV LAB;  Service: Cardiovascular;  Laterality: N/A;   RIGHT/LEFT HEART CATH AND CORONARY ANGIOGRAPHY N/A 10/07/2021   Procedure: RIGHT/LEFT HEART CATH AND CORONARY ANGIOGRAPHY;  Surgeon: Laurey Morale, MD;  Location: [facility] INVASIVE CV LAB;  Service: Cardiovascular;  Laterality: N/A;   TEE WITHOUT CARDIOVERSION N/A 07/28/2017   Procedure: TRANSESOPHAGEAL ECHOCARDIOGRAM (TEE);  Surgeon: Quintella Reichert, MD;  Location: [facility] ENDOSCOPY;  Service: Cardiovascular;  Laterality: N/A;    FHx:    Reviewed / unchanged  SHx:    Reviewed / unchanged  Systems Review:  Constitutional: Denies fever, chills, wt changes, headaches, insomnia, fatigue, night sweats, change in appetite. Eyes: Denies redness, blurred vision, diplopia, discharge, itchy, watery eyes.  ENT: Denies discharge, congestion, post nasal drip,  epistaxis, sore throat, earache, hearing loss, dental pain, tinnitus, vertigo, sinus pain, snoring.  CV: Denies chest pain, palpitations, irregular heartbeat, syncope, dyspnea, diaphoresis, orthopnea, PND, claudication or edema. Respiratory: denies cough, dyspnea, DOE, pleurisy, hoarseness, laryngitis, wheezing.  Gastrointestinal: Denies dysphagia, odynophagia, heartburn, reflux, water brash, abdominal pain or cramps, nausea, vomiting, bloating, diarrhea, constipation, hematemesis, melena, hematochezia  or hemorrhoids. Genitourinary: Denies dysuria, frequency, urgency, nocturia, hesitancy, discharge, hematuria or flank pain. Musculoskeletal: Denies arthralgias, myalgias, stiffness, jt. swelling, pain, limping or strain/sprain.  Skin: Denies pruritus, rash, hives, warts, acne, eczema or change in skin lesion(s). Neuro: No weakness, tremor, incoordination, spasms, paresthesia or pain. Psychiatric: Denies confusion, memory loss or sensory loss. Endo: Denies change in weight, skin or hair change.  Heme/Lymph: No excessive bleeding, bruising or enlarged lymph nodes.  Physical Exam  BP (!) 100/58   Pulse 73   Temp 98.5 F (36.9 C)   Ht 5\' 11"  (1.803 m)   Wt 154 lb 6.4 oz (70 kg)   SpO2 99%   BMI 21.53 kg/m   Appears  well nourished, well groomed  and in no distress.  Eyes: PERRLA, EOMs, conjunctiva no swelling or erythema. Sinuses: No frontal/maxillary tenderness ENT/Mouth: EAC's clear, TM's nl w/o erythema, bulging. Nares clear w/o erythema, swelling, exudates. Oropharynx clear without erythema or exudates. Oral hygiene is good. Tongue normal, non obstructing. Hearing intact.  Neck: Supple. Thyroid not palpable. Car 2+/2+ without bruits, nodes or JVD. Chest: Respirations nl with BS clear & equal w/o rales, rhonchi, wheezing or stridor.  Cor: Heart sounds normal w/ regular rate and rhythm without sig. murmurs, gallops, clicks or rubs. Peripheral pulses normal and equal  without edema.   Abdomen: Soft & bowel sounds normal. Non-tender w/o guarding, rebound, hernias, masses or organomegaly.  Lymphatics: Unremarkable.  Musculoskeletal: Full ROM all peripheral extremities, joint stability, 5/5 strength and normal gait.  Skin: Warm, dry without exposed rashes, lesions or ecchymosis apparent.  Neuro: Cranial nerves intact, reflexes equal bilaterally. Sensory-motor testing grossly intact. Tendon reflexes grossly intact.  Pysch: Alert & oriented x 3.  Insight and judgement nl & appropriate. No ideations.  Assessment and Plan:  1. Failure to thrive in adult (Primary)   2. Essential hypertension  - Continue medication, monitor blood pressure at home.  - Continue DASH diet.  Reminder to go to the ER if any CP,  SOB, nausea, dizziness, severe HA, changes vision/speech.     3. Paroxysmal atrial fibrillation (HCC)   4. Hyperlipidemia associated with type 2 diabetes mellitus (HCC)  - Continue diet/meds, exercise,& lifestyle modifications.  - Continue monitor periodic cholesterol/liver & renal functions       5. Type 2 diabetes mellitus with stage 2 chronic kidney disease, without long-term current use of insulin (HCC)  - Continue diet, exercise  - Lifestyle modifications.  - Monitor appropriate labs.    6. Diabetic 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 (HCC)   7. Hypothyroidism,   8. MGUS (monoclonal gammopathy of unknown significance)   9. Medication management   10. Need for immunization against influenza  - Flu vaccine HIGH DOSE PF(Fluzone Trivalent)          Discussed  regular exercise, BP monitoring, weight control to achieve/maintain BMI less than 25 and discussed med and SE's. Recommended labs to assess  and monitor clinical status with further disposition pending results of labs.  I discussed the assessment and treatment plan with the patient. The patient was provided an opportunity to ask questions and all were answered. The patient  agreed with the plan and demonstrated an understanding of the instructions.  I provided over 30 minutes of exam, counseling, chart review and  complex critical decision making.  Marinus Maw, MD

## 2023-10-31 NOTE — Patient Instructions (Signed)
Due to recent changes in healthcare laws, you may see the results of your imaging and laboratory studies on MyChart before your provider has had a chance to review them.  We understand that in some cases there may be results that are confusing or concerning to you. Not all laboratory results come back in the same time frame and the provider may be waiting for multiple results in order to interpret others.  Please give us 48 hours in order for your provider to thoroughly review all the results before contacting the office for clarification of your results.  ++++++++++++++++++++++++++  Vit D  & Vit C 1,000 mg   are recommended to help protect  against the Covid-19 and other Corona viruses.    Also it's recommended  to take  Zinc 50 mg  to help  protect against the Covid-19   and best place to get  is also on [employer]  and don't pay more than 6-8 cents /pill !   +++++++++++++++++++++++++++++++++++++++ Recommend Adult Low Dose Aspirin or  coated  Aspirin 81 mg daily  To reduce risk of Colon Cancer 40 %,  Skin Cancer 26 % ,  Melanoma 46%  and  Pancreatic cancer 60% +++++++++++++++++++++++++++++++++++++++++ Vitamin D goal  is between 70-100.  Please make sure that you are taking your Vitamin D as directed.  It is very important as a natural anti-inflammatory  helping hair, skin, and nails, as well as reducing stroke and heart attack risk.  It helps your bones and helps with mood. It also decreases numerous cancer risks so please take it as directed.  Low Vit D is associated with a 200-300% higher risk for CANCER  and 200-300% higher risk for HEART   ATTACK  &  STROKE.   ...................................... It is also associated with higher death rate at younger ages,  autoimmune diseases like Rheumatoid arthritis, Lupus, Multiple Sclerosis.    Also many other serious conditions, like depression, Alzheimer's Dementia, infertility, muscle aches, fatigue, fibromyalgia - just to name  a few. +++++++++++++++++++++++++++++++++++++++++ Recommend the book "The END of DIETING" by Dr Joel Fuhrman  & the book "The END of DIABETES " by Dr Joel Fuhrman At [employer] - get book & Audio CD's    Being diabetic has a  300% increased risk for heart attack, stroke, cancer, and alzheimer- type vascular dementia. It is very important that you work harder with diet by avoiding all foods that are white. Avoid white rice (brown & wild rice is OK), white potatoes (sweetpotatoes in moderation is OK), White bread or wheat bread or anything made out of white flour like bagels, donuts, rolls, buns, biscuits, cakes, pastries, cookies, pizza crust, and pasta (made from white flour & egg whites) - vegetarian pasta or spinach or wheat pasta is OK. Multigrain breads like Arnold's or [employer], or multigrain sandwich thins or flatbreads.  Diet, exercise and weight loss can reverse and cure diabetes in the early stages.  Diet, exercise and weight loss is very important in the control and prevention of complications of diabetes which affects every system in your body, ie. Brain - dementia/stroke, eyes - glaucoma/blindness, heart - heart attack/heart failure, kidneys - dialysis, stomach - gastric paralysis, intestines - malabsorption, nerves - severe painful neuritis, circulation - gangrene & loss of a leg(s), and finally cancer and Alzheimers.    I recommend avoid fried & greasy foods,  sweets/candy, white rice (brown or wild rice or Quinoa is OK), white potatoes (sweet potatoes are OK) - anything   made from white flour - bagels, doughnuts, rolls, buns, biscuits,white and wheat breads, pizza crust and traditional pasta made of white flour & egg white(vegetarian pasta or spinach or wheat pasta is OK).  Multi-grain bread is OK - like multi-grain flat bread or sandwich thins. Avoid alcohol in excess. Exercise is also important.    Eat all the vegetables you want - avoid meat, especially red meat and dairy - especially  cheese.  Cheese is the most concentrated form of trans-fats which is the worst thing to clog up our arteries. Veggie cheese is OK which can be found in the fresh produce section at [employer] or [employer] or [employer]  +++++++++++++++++++++++++++++++++++++++ DASH Eating Plan  DASH stands for "Dietary Approaches to Stop Hypertension."   The DASH eating plan is a healthy eating plan that has been shown to reduce high blood pressure (hypertension). Additional health benefits may include reducing the risk of type 2 diabetes mellitus, heart disease, and stroke. The DASH eating plan may also help with weight loss. WHAT DO I NEED TO KNOW ABOUT THE DASH EATING PLAN? For the DASH eating plan, you will follow these general guidelines: Choose foods with a percent daily value for sodium of less than 5% (as listed on the food label). Use salt-free seasonings or herbs instead of table salt or sea salt. Check with your health care provider or pharmacist before using salt substitutes. Eat lower-sodium products, often labeled as "lower sodium" or "no salt added." Eat fresh foods. Eat more vegetables, fruits, and low-fat dairy products. Choose whole grains. Look for the word "whole" as the first word in the ingredient list. Choose fish  Limit sweets, desserts, sugars, and sugary drinks. Choose heart-healthy fats. Eat veggie cheese  Eat more home-cooked food and less restaurant, buffet, and fast food. Limit fried foods. Cook foods using methods other than frying. Limit canned vegetables. If you do use them, rinse them well to decrease the sodium. When eating at a restaurant, ask that your food be prepared with less salt, or no salt if possible.                      WHAT FOODS CAN I EAT? Read Dr Joel Fuhrman's books on The End of Dieting & The End of Diabetes  Grains Whole grain or whole wheat bread. Brown rice. Whole grain or whole wheat pasta. Quinoa, bulgur, and whole grain cereals. Low-sodium  cereals. Corn or whole wheat flour tortillas. Whole grain cornbread. Whole grain crackers. Low-sodium crackers.  Vegetables Fresh or frozen vegetables (raw, steamed, roasted, or grilled). Low-sodium or reduced-sodium tomato and vegetable juices. Low-sodium or reduced-sodium tomato sauce and paste. Low-sodium or reduced-sodium canned vegetables.   Fruits All fresh, canned (in natural juice), or frozen fruits.  Protein Products  All fish and seafood.  Dried beans, peas, or lentils. Unsalted nuts and seeds. Unsalted canned beans.  Dairy Low-fat dairy products, such as skim or 1% milk, 2% or reduced-fat cheeses, low-fat ricotta or cottage cheese, or plain low-fat yogurt. Low-sodium or reduced-sodium cheeses.  Fats and Oils Tub margarines without trans fats. Light or reduced-fat mayonnaise and salad dressings (reduced sodium). Avocado. Safflower, olive, or canola oils. Natural peanut or almond butter.  Other Unsalted popcorn and pretzels. The items listed above may not be a complete list of recommended foods or beverages. Contact your dietitian for more options.  +++++++++++++++  WHAT FOODS ARE NOT RECOMMENDED? Grains/ White flour or wheat flour White bread. White pasta. White rice. Refined   cornbread. Bagels and croissants. Crackers that contain trans fat.  Vegetables  Creamed or fried vegetables. Vegetables in a . Regular canned vegetables. Regular canned tomato sauce and paste. Regular tomato and vegetable juices.  Fruits Dried fruits. Canned fruit in light or heavy syrup. Fruit juice.  Meat and Other Protein Products Meat in general - RED meat & White meat.  Fatty cuts of meat. Ribs, chicken wings, all processed meats as bacon, sausage, bologna, salami, fatback, hot dogs, bratwurst and packaged luncheon meats.  Dairy Whole or 2% milk, cream, half-and-half, and cream cheese. Whole-fat or sweetened yogurt. Full-fat cheeses or blue cheese. Non-dairy creamers and whipped toppings.  Processed cheese, cheese spreads, or cheese curds.  Condiments Onion and garlic salt, seasoned salt, table salt, and sea salt. Canned and packaged gravies. Worcestershire sauce. Tartar sauce. Barbecue sauce. Teriyaki sauce. Soy sauce, including reduced sodium. Steak sauce. Fish sauce. Oyster sauce. Cocktail sauce. Horseradish. Ketchup and mustard. Meat flavorings and tenderizers. Bouillon cubes. Hot sauce. Tabasco sauce. Marinades. Taco seasonings. Relishes.  Fats and Oils Butter, stick margarine, lard, shortening and bacon fat. Coconut, palm kernel, or palm oils. Regular salad dressings.  Pickles and olives. Salted popcorn and pretzels.  The items listed above may not be a complete list of foods and beverages to avoid.  

## 2023-11-01 ENCOUNTER — Other Ambulatory Visit: Payer: Self-pay | Admitting: Internal Medicine

## 2023-11-01 DIAGNOSIS — E1122 Type 2 diabetes mellitus with diabetic chronic kidney disease: Secondary | ICD-10-CM

## 2023-11-01 LAB — CBC WITH DIFFERENTIAL/PLATELET
Absolute Lymphocytes: 1565 {cells}/uL (ref 850–3900)
Absolute Monocytes: 470 {cells}/uL (ref 200–950)
Basophils Absolute: 38 {cells}/uL (ref 0–200)
Basophils Relative: 0.8 %
Eosinophils Absolute: 268 {cells}/uL (ref 15–500)
Eosinophils Relative: 5.7 %
HCT: 33.7 % — ABNORMAL LOW (ref 38.5–50.0)
Hemoglobin: 11.4 g/dL — ABNORMAL LOW (ref 13.2–17.1)
MCH: 29.8 pg (ref 27.0–33.0)
MCHC: 33.8 g/dL (ref 32.0–36.0)
MCV: 88 fL (ref 80.0–100.0)
MPV: 10.7 fL (ref 7.5–12.5)
Monocytes Relative: 10 %
Neutro Abs: 2359 {cells}/uL (ref 1500–7800)
Neutrophils Relative %: 50.2 %
Platelets: 226 10*3/uL (ref 140–400)
RBC: 3.83 10*6/uL — ABNORMAL LOW (ref 4.20–5.80)
RDW: 12.9 % (ref 11.0–15.0)
Total Lymphocyte: 33.3 %
WBC: 4.7 10*3/uL (ref 3.8–10.8)

## 2023-11-01 LAB — URINALYSIS, ROUTINE W REFLEX MICROSCOPIC
Bilirubin Urine: NEGATIVE
Glucose, UA: NEGATIVE
Hgb urine dipstick: NEGATIVE
Ketones, ur: NEGATIVE
Leukocytes,Ua: NEGATIVE
Nitrite: NEGATIVE
Protein, ur: NEGATIVE
Specific Gravity, Urine: 1.014 (ref 1.001–1.035)
pH: 6.5 (ref 5.0–8.0)

## 2023-11-01 LAB — LIPID PANEL
Cholesterol: 120 mg/dL (ref <200)
HDL: 55 mg/dL (ref >=40)
LDL Cholesterol (Calc): 52 mg/dL
Non-HDL Cholesterol (Calc): 65 mg/dL (ref <130)
Total CHOL/HDL Ratio: 2.2 (calc) (ref <5.0)
Triglycerides: 58 mg/dL (ref <150)

## 2023-11-01 LAB — COMPLETE METABOLIC PANEL WITH GFR
AG Ratio: 1.5 (calc) (ref 1.0–2.5)
ALT: 18 U/L (ref 9–46)
AST: 19 U/L (ref 10–35)
Albumin: 3.7 g/dL (ref 3.6–5.1)
Alkaline phosphatase (APISO): 68 U/L (ref 35–144)
BUN/Creatinine Ratio: 27 (calc) — ABNORMAL HIGH (ref 6–22)
BUN: 27 mg/dL — ABNORMAL HIGH (ref 7–25)
CO2: 26 mmol/L (ref 20–32)
Calcium: 9.4 mg/dL (ref 8.6–10.3)
Chloride: 105 mmol/L (ref 98–110)
Creat: 1 mg/dL (ref 0.70–1.22)
Globulin: 2.5 g/dL (ref 1.9–3.7)
Glucose, Bld: 78 mg/dL (ref 65–99)
Potassium: 4.8 mmol/L (ref 3.5–5.3)
Sodium: 140 mmol/L (ref 135–146)
Total Bilirubin: 0.4 mg/dL (ref 0.2–1.2)
Total Protein: 6.2 g/dL (ref 6.1–8.1)
eGFR: 75 mL/min/{1.73_m2} (ref >=60)

## 2023-11-01 LAB — MICROALBUMIN / CREATININE URINE RATIO
Creatinine, Urine: 60 mg/dL (ref 20–320)
Microalb Creat Ratio: 18 mg/g{creat} (ref <30)
Microalb, Ur: 1.1 mg/dL

## 2023-11-01 LAB — MAGNESIUM: Magnesium: 1.9 mg/dL (ref 1.5–2.5)

## 2023-11-01 LAB — HEMOGLOBIN A1C
Hgb A1c MFr Bld: 6.8 %{Hb} — ABNORMAL HIGH (ref <5.7)
Mean Plasma Glucose: 148 mg/dL
eAG (mmol/L): 8.2 mmol/L

## 2023-11-01 LAB — TSH: TSH: 0.46 m[IU]/L (ref 0.40–4.50)

## 2023-11-01 LAB — PSA: PSA: 0.15 ng/mL (ref <=4.00)

## 2023-11-01 LAB — VITAMIN D 25 HYDROXY (VIT D DEFICIENCY, FRACTURES): Vit D, 25-Hydroxy: 74 ng/mL (ref 30–100)

## 2023-11-01 LAB — INSULIN, RANDOM: Insulin: 5.3 u[IU]/mL

## 2023-11-01 MED ORDER — GLIPIZIDE 5 MG PO TABS: ORAL_TABLET | ORAL | 3 refills | Status: DC

## 2023-11-01 NOTE — Progress Notes (Signed)
[][][][][][][][][][][][][][][][][][][][][][][][][][][][][][][][][][][][][][][][][]][][][][][][][][][][][][][][][][][][][][][][][[][][][][]  -  Test results slightly outside the reference range are not unusual. If there is anything important, I will review this with you,  otherwise it is considered normal test values.  If you have further questions,  please do not hesitate to contact me at the office or via My Chart.   [] [] [] [] [] [] [] [] [] [] [] [] [] [] [] [] [] [] [] [] [] [] [] [] [] [] [] [] [] [] [] [] [] [] [] [] [] [] [] [] [] ][] [] [] [] [] [] [] [] [] [] [] [] [] [] [] [] [] [] [] [] [] [] [[] [] [] [] []   -  Mild chronic Anemia - Stable  [] [] [] [] [] [] [] [] [] [] [] [] [] [] [] [] [] [] [] [] [] [] [] [] [] [] [] [] [] [] [] [] [] [] [] [] [] [] [] [] [] ][] [] [] [] [] [] [] [] [] [] [] [] [] [] [] [] [] [] [] [] [] [] [[] [] [] [] []   -  A1c = 6.8% is too high  - equate to an average blood sugar  of   148 mg%  Goal A1c is less than 5.7%  and goal blood sugar is less than 120 mg%  [] [] [] [] [] [] [] [] [] [] [] [] [] [] [] [] [] [] [] [] [] [] [] [] [] [] [] [] [] [] [] [] [] [] [] [] [] [] [] [] [] ][] [] [] [] [] [] [] [] [] [] [] [] [] [] [] [] [] [] [] [] [] [] [[] [] [] [] []   -  PSA is low - No Prostate Cancer -  Great ! -   [] [] [] [] [] [] [] [] [] [] [] [] [] [] [] [] [] [] [] [] [] [] [] [] [] [] [] [] [] [] [] [] [] [] [] [] [] [] [] [] [] ][] [] [] [] [] [] [] [] [] [] [] [] [] [] [] [] [] [] [] [] [] [] [[] [] [] [] []   -  Vitamin D = 74  - Excellent   -   Please keep dosage same  [] [] [] [] [] [] [] [] [] [] [] [] [] [] [] [] [] [] [] [] [] [] [] [] [] [] [] [] [] [] [] [] [] [] [] [] [] [] [] [] [] ][] [] [] [] [] [] [] [] [] [] [] [] [] [] [] [] [] [] [] [] [] [] [[] [] [] [] []   -  Chol = 120 - Excellent  [] [] [] [] [] [] [] [] [] [] [] [] [] [] [] [] [] [] [] [] [] [] [] [] [] [] [] [] [] [] [] [] [] [] [] [] [] [] [] [] [] ][] [] [] [] [] [] [] [] [] [] [] [] [] [] [] [] [] [] [] [] [] [] [[] [] [] [] []   -  All Else - CBC - Kidneys - Electrolytes - Liver - Magnesium & Thyroid    - all  Normal / OK  [] [] [] [] [] [] [] [] [] [] [] [] [] [] [] [] [] [] [] [] [] [] [] [] [] [] [] [] [] [] [] [] [] [] [] [] [] [] [] [] [] ][] [] [] [] [] [] [] [] [] [] [] [] [] [] [] [] [] [] [] [] [] [] [[] [] [] [] []

## 2023-11-09 ENCOUNTER — Other Ambulatory Visit: Payer: Self-pay | Admitting: Internal Medicine

## 2023-11-09 DIAGNOSIS — I480 Paroxysmal atrial fibrillation: Secondary | ICD-10-CM

## 2023-11-10 ENCOUNTER — Telehealth: Payer: Self-pay | Admitting: Neurology

## 2023-11-10 DIAGNOSIS — I480 Paroxysmal atrial fibrillation: Secondary | ICD-10-CM | POA: Diagnosis not present

## 2023-11-10 DIAGNOSIS — N182 Chronic kidney disease, stage 2 (mild): Secondary | ICD-10-CM | POA: Diagnosis not present

## 2023-11-10 DIAGNOSIS — I872 Venous insufficiency (chronic) (peripheral): Secondary | ICD-10-CM | POA: Diagnosis not present

## 2023-11-10 DIAGNOSIS — E1141 Type 2 diabetes mellitus with diabetic mononeuropathy: Secondary | ICD-10-CM | POA: Diagnosis not present

## 2023-11-10 DIAGNOSIS — I69351 Hemiplegia and hemiparesis following cerebral infarction affecting right dominant side: Secondary | ICD-10-CM | POA: Diagnosis not present

## 2023-11-10 DIAGNOSIS — E1122 Type 2 diabetes mellitus with diabetic chronic kidney disease: Secondary | ICD-10-CM | POA: Diagnosis not present

## 2023-11-10 NOTE — Telephone Encounter (Signed)
Pt's daughter called in stating she was trying to send a message through [employer], but since Dr. Allena Katz is out it won't let her send anything. She says the pt's PT was accidentally cancelled when his wound care was completed due to an error with that home health company. She would like to give more details on mychart if someone could send her a message.

## 2023-11-10 NOTE — Telephone Encounter (Signed)
Message sent on Mychart as requested so patients daughter may update [place] on details.

## 2023-11-13 ENCOUNTER — Telehealth (HOSPITAL_COMMUNITY): Payer: Self-pay | Admitting: Pharmacy Technician

## 2023-11-13 ENCOUNTER — Encounter: Payer: Self-pay | Admitting: Nurse Practitioner

## 2023-11-13 NOTE — Telephone Encounter (Signed)
Patient Advocate Encounter   Received notification from [employer] that prior authorization for Vyndamax is required.   PA submitted on CoverMyMeds Key [id number] Status is pending   Will continue to follow.

## 2023-11-13 NOTE — Telephone Encounter (Signed)
Please call patient and advise we can not order home health without a visit . Not to be added today

## 2023-11-14 ENCOUNTER — Other Ambulatory Visit: Payer: Self-pay

## 2023-11-14 ENCOUNTER — Observation Stay (HOSPITAL_COMMUNITY)
Admission: EM | Admit: 2023-11-14 | Discharge: 2023-11-16 | Disposition: A | Discharge status: Home / Self Care | Payer: Medicare Other | Attending: Internal Medicine | Admitting: Internal Medicine

## 2023-11-14 ENCOUNTER — Encounter (HOSPITAL_COMMUNITY): Payer: Self-pay | Admitting: Internal Medicine

## 2023-11-14 ENCOUNTER — Encounter: Payer: Self-pay | Admitting: Nurse Practitioner

## 2023-11-14 ENCOUNTER — Encounter: Payer: Self-pay | Admitting: Cardiology

## 2023-11-14 ENCOUNTER — Encounter: Payer: Self-pay | Admitting: Internal Medicine

## 2023-11-14 ENCOUNTER — Emergency Department (HOSPITAL_COMMUNITY): Payer: Medicare Other

## 2023-11-14 DIAGNOSIS — D649 Anemia, unspecified: Secondary | ICD-10-CM | POA: Diagnosis present

## 2023-11-14 DIAGNOSIS — L89620 Pressure ulcer of left heel, unstageable: Secondary | ICD-10-CM | POA: Diagnosis not present

## 2023-11-14 DIAGNOSIS — E7849 Other hyperlipidemia: Secondary | ICD-10-CM

## 2023-11-14 DIAGNOSIS — W19XXXA Unspecified fall, initial encounter: Secondary | ICD-10-CM | POA: Diagnosis not present

## 2023-11-14 DIAGNOSIS — N400 Enlarged prostate without lower urinary tract symptoms: Secondary | ICD-10-CM | POA: Diagnosis present

## 2023-11-14 DIAGNOSIS — I5022 Chronic systolic (congestive) heart failure: Secondary | ICD-10-CM | POA: Diagnosis not present

## 2023-11-14 DIAGNOSIS — D509 Iron deficiency anemia, unspecified: Secondary | ICD-10-CM | POA: Insufficient documentation

## 2023-11-14 DIAGNOSIS — E039 Hypothyroidism, unspecified: Secondary | ICD-10-CM | POA: Diagnosis present

## 2023-11-14 DIAGNOSIS — I480 Paroxysmal atrial fibrillation: Secondary | ICD-10-CM | POA: Diagnosis present

## 2023-11-14 DIAGNOSIS — G9341 Metabolic encephalopathy: Principal | ICD-10-CM | POA: Diagnosis present

## 2023-11-14 DIAGNOSIS — Z79899 Other long term (current) drug therapy: Secondary | ICD-10-CM | POA: Diagnosis not present

## 2023-11-14 DIAGNOSIS — E1140 Type 2 diabetes mellitus with diabetic neuropathy, unspecified: Secondary | ICD-10-CM | POA: Insufficient documentation

## 2023-11-14 DIAGNOSIS — L89151 Pressure ulcer of sacral region, stage 1: Secondary | ICD-10-CM | POA: Diagnosis not present

## 2023-11-14 DIAGNOSIS — Z7984 Long term (current) use of oral hypoglycemic drugs: Secondary | ICD-10-CM | POA: Insufficient documentation

## 2023-11-14 DIAGNOSIS — D170 Benign lipomatous neoplasm of skin and subcutaneous tissue of head, face and neck: Secondary | ICD-10-CM | POA: Diagnosis not present

## 2023-11-14 DIAGNOSIS — R531 Weakness: Secondary | ICD-10-CM

## 2023-11-14 DIAGNOSIS — L89300 Pressure ulcer of unspecified buttock, unstageable: Secondary | ICD-10-CM | POA: Insufficient documentation

## 2023-11-14 DIAGNOSIS — S199XXA Unspecified injury of neck, initial encounter: Secondary | ICD-10-CM | POA: Diagnosis not present

## 2023-11-14 DIAGNOSIS — R739 Hyperglycemia, unspecified: Secondary | ICD-10-CM | POA: Diagnosis not present

## 2023-11-14 DIAGNOSIS — Z950 Presence of cardiac pacemaker: Secondary | ICD-10-CM | POA: Insufficient documentation

## 2023-11-14 DIAGNOSIS — M62838 Other muscle spasm: Secondary | ICD-10-CM | POA: Diagnosis not present

## 2023-11-14 DIAGNOSIS — D472 Monoclonal gammopathy: Secondary | ICD-10-CM | POA: Diagnosis present

## 2023-11-14 DIAGNOSIS — W1809XA Striking against other object with subsequent fall, initial encounter: Secondary | ICD-10-CM | POA: Insufficient documentation

## 2023-11-14 DIAGNOSIS — I10 Essential (primary) hypertension: Secondary | ICD-10-CM | POA: Diagnosis not present

## 2023-11-14 DIAGNOSIS — Z7901 Long term (current) use of anticoagulants: Secondary | ICD-10-CM | POA: Diagnosis not present

## 2023-11-14 DIAGNOSIS — E785 Hyperlipidemia, unspecified: Secondary | ICD-10-CM | POA: Diagnosis present

## 2023-11-14 DIAGNOSIS — R627 Adult failure to thrive: Secondary | ICD-10-CM

## 2023-11-14 DIAGNOSIS — S0990XA Unspecified injury of head, initial encounter: Secondary | ICD-10-CM | POA: Diagnosis not present

## 2023-11-14 DIAGNOSIS — Z8673 Personal history of transient ischemic attack (TIA), and cerebral infarction without residual deficits: Secondary | ICD-10-CM

## 2023-11-14 DIAGNOSIS — R400 Somnolence: Secondary | ICD-10-CM

## 2023-11-14 DIAGNOSIS — E1141 Type 2 diabetes mellitus with diabetic mononeuropathy: Secondary | ICD-10-CM

## 2023-11-14 DIAGNOSIS — E119 Type 2 diabetes mellitus without complications: Secondary | ICD-10-CM

## 2023-11-14 DIAGNOSIS — Y92009 Unspecified place in unspecified non-institutional (private) residence as the place of occurrence of the external cause: Principal | ICD-10-CM

## 2023-11-14 DIAGNOSIS — M4802 Spinal stenosis, cervical region: Secondary | ICD-10-CM | POA: Diagnosis not present

## 2023-11-14 DIAGNOSIS — L8990 Pressure ulcer of unspecified site, unspecified stage: Secondary | ICD-10-CM

## 2023-11-14 DIAGNOSIS — M47812 Spondylosis without myelopathy or radiculopathy, cervical region: Secondary | ICD-10-CM | POA: Diagnosis not present

## 2023-11-14 LAB — CBC WITH DIFFERENTIAL/PLATELET
Abs Immature Granulocytes: 0.04 10*3/uL (ref 0.00–0.07)
Basophils Absolute: 0 10*3/uL (ref 0.0–0.1)
Basophils Relative: 1 %
Eosinophils Absolute: 0.1 10*3/uL (ref 0.0–0.5)
Eosinophils Relative: 1 %
HCT: 33.1 % — ABNORMAL LOW (ref 39.0–52.0)
Hemoglobin: 11.1 g/dL — ABNORMAL LOW (ref 13.0–17.0)
Immature Granulocytes: 1 %
Lymphocytes Relative: 15 %
Lymphs Abs: 1.1 10*3/uL (ref 0.7–4.0)
MCH: 29.9 pg (ref 26.0–34.0)
MCHC: 33.5 g/dL (ref 30.0–36.0)
MCV: 89.2 fL (ref 80.0–100.0)
Monocytes Absolute: 0.7 10*3/uL (ref 0.1–1.0)
Monocytes Relative: 10 %
Neutro Abs: 5.2 10*3/uL (ref 1.7–7.7)
Neutrophils Relative %: 72 %
Platelets: 207 10*3/uL (ref 150–400)
RBC: 3.71 MIL/uL — ABNORMAL LOW (ref 4.22–5.81)
RDW: 14.3 % (ref 11.5–15.5)
WBC: 7.1 10*3/uL (ref 4.0–10.5)
nRBC: 0 % (ref 0.0–0.2)

## 2023-11-14 LAB — COMPREHENSIVE METABOLIC PANEL
ALT: 33 U/L (ref 0–44)
AST: 40 U/L (ref 15–41)
Albumin: 3.1 g/dL — ABNORMAL LOW (ref 3.5–5.0)
Alkaline Phosphatase: 59 U/L (ref 38–126)
Anion gap: 9 (ref 5–15)
BUN: 20 mg/dL (ref 8–23)
CO2: 25 mmol/L (ref 22–32)
Calcium: 9.1 mg/dL (ref 8.9–10.3)
Chloride: 108 mmol/L (ref 98–111)
Creatinine, Ser: 0.98 mg/dL (ref 0.61–1.24)
GFR, Estimated: >60 mL/min (ref >60)
Glucose, Bld: 203 mg/dL — ABNORMAL HIGH (ref 70–99)
Potassium: 4.1 mmol/L (ref 3.5–5.1)
Sodium: 142 mmol/L (ref 135–145)
Total Bilirubin: 0.5 mg/dL (ref <1.2)
Total Protein: 5.6 g/dL — ABNORMAL LOW (ref 6.5–8.1)

## 2023-11-14 LAB — I-STAT VENOUS BLOOD GAS, ED
Acid-base deficit: 1 mmol/L (ref 0.0–2.0)
Bicarbonate: 23.4 mmol/L (ref 20.0–28.0)
Calcium, Ion: 1.05 mmol/L — ABNORMAL LOW (ref 1.15–1.40)
HCT: 26 % — ABNORMAL LOW (ref 39.0–52.0)
Hemoglobin: 8.8 g/dL — ABNORMAL LOW (ref 13.0–17.0)
O2 Saturation: 98 %
Potassium: 3.6 mmol/L (ref 3.5–5.1)
Sodium: 144 mmol/L (ref 135–145)
TCO2: 25 mmol/L (ref 22–32)
pCO2, Ven: 35.5 mm[Hg] — ABNORMAL LOW (ref 44–60)
pH, Ven: 7.428 (ref 7.25–7.43)
pO2, Ven: 103 mm[Hg] — ABNORMAL HIGH (ref 32–45)

## 2023-11-14 LAB — URINALYSIS, ROUTINE W REFLEX MICROSCOPIC
Bilirubin Urine: NEGATIVE
Glucose, UA: 50 mg/dL — AB
Hgb urine dipstick: NEGATIVE
Ketones, ur: NEGATIVE mg/dL
Leukocytes,Ua: NEGATIVE
Nitrite: NEGATIVE
Protein, ur: NEGATIVE mg/dL
Specific Gravity, Urine: 1.013 (ref 1.005–1.030)
pH: 6 (ref 5.0–8.0)

## 2023-11-14 LAB — CBG MONITORING, ED
Glucose-Capillary: 167 mg/dL — ABNORMAL HIGH (ref 70–99)
Glucose-Capillary: 121 mg/dL — ABNORMAL HIGH (ref 70–99)

## 2023-11-14 MED ORDER — INSULIN ASPART 100 UNIT/ML IJ SOLN
0.0000 [IU] | Freq: Every day | INTRAMUSCULAR | Status: DC
  Administered 2023-11-15: 2 [IU] via SUBCUTANEOUS

## 2023-11-14 MED ORDER — LEVOTHYROXINE SODIUM 25 MCG PO TABS
25.0000 ug | ORAL_TABLET | Freq: Every day | ORAL | Status: DC
  Administered 2023-11-15 – 2023-11-16 (×2): 25 ug via ORAL
  Filled 2023-11-14 (×2): qty 1

## 2023-11-14 MED ORDER — SENNOSIDES-DOCUSATE SODIUM 8.6-50 MG PO TABS: 1.0000 | ORAL_TABLET | Freq: Every evening | ORAL | Status: DC | PRN

## 2023-11-14 MED ORDER — METOPROLOL SUCCINATE ER 50 MG PO TB24
50.0000 mg | ORAL_TABLET | Freq: Every day | ORAL | Status: DC
  Administered 2023-11-15: 50 mg via ORAL
  Filled 2023-11-14: qty 1

## 2023-11-14 MED ORDER — APIXABAN 5 MG PO TABS
5.0000 mg | ORAL_TABLET | Freq: Two times a day (BID) | ORAL | Status: DC
  Administered 2023-11-15 – 2023-11-16 (×4): 5 mg via ORAL
  Filled 2023-11-14 (×4): qty 1

## 2023-11-14 MED ORDER — ACETAMINOPHEN 325 MG PO TABS: 650.0000 mg | ORAL_TABLET | Freq: Four times a day (QID) | ORAL | Status: DC | PRN

## 2023-11-14 MED ORDER — SODIUM CHLORIDE 0.9% FLUSH
3.0000 mL | Freq: Two times a day (BID) | INTRAVENOUS | Status: DC
  Administered 2023-11-15: 3 mL via INTRAVENOUS

## 2023-11-14 MED ORDER — ONDANSETRON HCL 4 MG/2ML IJ SOLN
4.0000 mg | Freq: Four times a day (QID) | INTRAMUSCULAR | Status: DC | PRN
Start: 2023-11-14 — End: 2023-11-16

## 2023-11-14 MED ORDER — SODIUM CHLORIDE 0.9% FLUSH
3.0000 mL | INTRAVENOUS | Status: DC | PRN
Start: 2023-11-14 — End: 2023-11-16

## 2023-11-14 MED ORDER — INSULIN ASPART 100 UNIT/ML IJ SOLN
0.0000 [IU] | Freq: Three times a day (TID) | INTRAMUSCULAR | Status: DC
  Administered 2023-11-15: 1 [IU] via SUBCUTANEOUS
  Administered 2023-11-15: 2 [IU] via SUBCUTANEOUS
  Administered 2023-11-16: 1 [IU] via SUBCUTANEOUS

## 2023-11-14 MED ORDER — SODIUM CHLORIDE 0.9% FLUSH
3.0000 mL | Freq: Two times a day (BID) | INTRAVENOUS | Status: DC
  Administered 2023-11-15 – 2023-11-16 (×2): 3 mL via INTRAVENOUS

## 2023-11-14 MED ORDER — ACETAMINOPHEN 650 MG RE SUPP: 650.0000 mg | Freq: Four times a day (QID) | RECTAL | Status: DC | PRN

## 2023-11-14 MED ORDER — ATORVASTATIN CALCIUM 40 MG PO TABS
40.0000 mg | ORAL_TABLET | Freq: Every day | ORAL | Status: DC
  Administered 2023-11-15 – 2023-11-16 (×2): 40 mg via ORAL
  Filled 2023-11-14 (×2): qty 1

## 2023-11-14 MED ORDER — FERROUS SULFATE 325 (65 FE) MG PO TABS
325.0000 mg | ORAL_TABLET | Freq: Every day | ORAL | Status: DC
  Administered 2023-11-15 (×2): 325 mg via ORAL
  Filled 2023-11-14 (×2): qty 1

## 2023-11-14 MED ORDER — ONDANSETRON HCL 4 MG PO TABS: 4.0000 mg | ORAL_TABLET | Freq: Four times a day (QID) | ORAL | Status: DC | PRN

## 2023-11-14 MED ORDER — SODIUM CHLORIDE 0.9 % IV SOLN: 250.0000 mL | INTRAVENOUS | Status: AC | PRN

## 2023-11-14 NOTE — ED Provider Notes (Signed)
[place] [facility]PARTMENT AT [facility] Provider Note   CSN: [id number] Arrival date & time: 11/14/23  1442     History  Chief Complaint  Patient presents with   Fall    On thinners    Earl [name] is a 82 y.o. male with PMH as listed below who presents BIB by EMS from home. Pt walking around house and suddenly fell face first onto carpet. Patient states he tripped.  Denies pain except for upper lip pain. Denies N/V/D. Took all of his meds this morning. Pt is on eliquis. He states he has otherwise been in his NSOH. Family at bedside states that today he has been especially sleepy, difficult to arouse, which is unlike him.  Per last neurology note from June 2024, patient has a history of gait instability and falls and uses a walker at home.  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  Daughter bedside states that patient has had increased stiffness and muscle spasms and so has been using baclofen to try to help with that.    Past Medical History:  Diagnosis Date   Adenomatous colon polyp    Allergy    Anal fissure    Arthritis    BPH (benign prostatic hyperplasia)    Diabetes mellitus (HCC)    GERD (gastroesophageal reflux disease)    Hyperlipidemia    Hypertension    Hypothyroidism    Other testicular hypofunction    Stroke [facility])        Home Medications Prior to Admission medications   Medication Sig Start Date End Date Taking? Authorizing Provider  AMVUTTRA 25 MG/0.5ML syringe INJECT 25MG  (0.5ML) SUBCUTANEOUSLY ONCE EVERY 3 MONTHS. ROTATE ADMINISTRATION SITE WITH EACH INJECTION. *REFRIGERATE. DO NOT SHAKE.* 10/24/23  Yes Laurey Morale, MD  atorvastatin (LIPITOR) 40 MG tablet Take  1 tablet  Daily for Cholesterol                                                       /                                  TAKE                                                       BY                                            MOUTH 04/15/23  Yes [name], MD  baclofen 5 MG TABS Take 1 tablet (5 mg total) by mouth 2 (two) times daily. 10/12/23  Yes Patel, Donika K, DO  Cholecalciferol (VITAMIN D PO) Take 1 tablet by mouth daily.    Yes [provider]  clotrimazole-betamethasone (LOTRISONE) cream Apply to affected area 2 to 4 x / day 05/17/23 05/16/24 Yes [name], NP  Cyanocobalamin (B-12) 50 MCG TABS Take 50 mcg by mouth daily.   Yes [provider]  ELIQUIS 5 MG TABS tablet TAKE 1 TABLET BY MOUTH TWICE  DAILY TO PREVENT BLOOD CLOTS 11/09/23  Yes [name], NP  Emollient (AQUAPHOR ADV PROTECT HEALING) OINT Apply small amount to two areas on buttocks daily to prevent breakdown 07/19/23  Yes [name], NP  ferrous sulfate 325 (65 FE) MG tablet Take 325 mg by mouth at bedtime.   Yes [provider]  finasteride (PROSCAR) 5 MG tablet TAKE 1 TABLET BY MOUTH DAILY FOR PROSTATE 09/15/23  Yes [name], NP  glipiZIDE (GLUCOTROL) 5 MG tablet Take 1/2 to 1 tablet 3 x / day with Meals for Diabetes Patient taking differently: Take 2.5 mg by mouth 3 (three) times daily with meals. for Diabetes 11/01/23  Yes [name], MD  levothyroxine (SYNTHROID) 50 MCG tablet TAKE 1 TABLET BY MOUTH DAILY ON  AN EMPTY STOMACH WITH ONLY WATER FOR 30 MINUTES AND NO ANTACID,  MEDS, CALCIUM OR MAGNESIUM FOR 4 HOURS AND AVOID BIOTIN Patient taking differently: Take 25-50 mcg by mouth See admin instructions. Take 1 tablet by mouth on Sun, Tues, Thurs and 1/2 tablet all other days ON AN EMPTY STOMACH WITH ONLY WATER FOR 30 MINUTES AND NO ANTACID,  MEDS, CALCIUM OR MAGNESIUM FOR 4 HOURS AND AVOID BIOTIN 05/23/23  Yes [name], NP  Magnesium 250 MG TABS Take 250 mg by mouth daily.    Yes [provider]  metFORMIN (GLUCOPHAGE-XR) 500 MG 24 hr tablet TAKE 2 TABLETS BY MOUTH TWICE  DAILY WITH MEALS FOR DIABETES 07/31/23  Yes [name], NP   metoprolol succinate (TOPROL-XL) 50 MG 24 hr tablet TAKE 1 TABLET BY MOUTH AT  BEDTIME 10/31/23  Yes Laurey Morale, MD  Multiple Vitamin (MULTIVITAMIN) capsule Take 1 capsule by mouth daily.   Yes [provider]  potassium chloride SA (KLOR-CON M20) 20 MEQ tablet Take 1 tablet by mouth once daily 09/25/23  Yes Milford, Jessica M, FNP  sacubitril-valsartan (ENTRESTO) 24-26 MG Take 1 tablet by mouth 2 (two) times daily. 02/21/23  Yes Laurey Morale, MD  spironolactone (ALDACTONE) 25 MG tablet Take 1 tablet (25 mg total) by mouth at bedtime. 11/30/22  Yes Laurey Morale, MD  Tafamidis [facility]) 61 MG CAPS Take 1 capsule by mouth daily. 04/12/23  Yes Laurey Morale, MD  torsemide (DEMADEX) 20 MG tablet Take  1 tablet   2 x /day  for Fluid Retention & Leg / Ankle Swelling 03/23/23  Yes [name], MD  triamcinolone cream (KENALOG) 0.1 % Apply 1 Application topically as needed.   Yes [provider]  trolamine salicylate (ASPERCREME) 10 % cream Apply 1 application topically daily as needed for muscle pain.   Yes [provider]  VITAMIN A PO Take by mouth. 3000 mg   Yes [provider]  vitamin C (ASCORBIC ACID) 500 MG tablet Take 500 mg by mouth daily.   Yes [provider]  Blood Glucose Monitoring Suppl (ONE TOUCH ULTRA 2) w/Device KIT Check blood sugar 1 time a day-DX-E11.9 03/23/23   [name], MD  Continuous Glucose Receiver (FREESTYLE LIBRE 3 READER) DEVI Use with freestyle libre sensor 10/21/23   [name], NP  Continuous Glucose Sensor (FREESTYLE LIBRE 3 SENSOR) MISC Place 1 sensor on the skin every 14 days. Use to check glucose continuously 10/21/23   [name], NP  glucose blood [facility] ULTRA) test strip Use as instructed 03/23/23   [name], MD  Lancets [facility]  DELICA PLUS LANCET30G) MISC CHECK BLOOD SUGAR ONCE A DAY. 03/23/23   [name], MD  Lancets Misc. (ONE TOUCH SURESOFT) MISC Check blood sugar 1  time a day-DX-E11.91 03/23/23   [name], MD  NONFORMULARY OR COMPOUNDED ITEM Please dispense a Rolling Walker.  Dx:R26.81 & G63 12/08/22   Nita Sickle K, DO      Allergies    Penicillins    Review of Systems   Review of Systems A 10 point review of systems was performed and is negative unless otherwise reported in HPI.  Physical Exam Updated Vital Signs BP 99/66   Pulse 72   Temp 98.1 F (36.7 C) (Oral)   Resp 16   Ht 5\' 11"  (1.803 m)   Wt 69.9 kg   SpO2 100%   BMI 21.48 kg/m  Physical Exam General: Normal appearing elderly male, lying in bed.  HEENT: PERRLA, EOMI, Sclera anicteric, MMM, trachea midline. NCAT. Mild swelling to upper lip with superficial abrasion on upper inner lip due to teeth. Dentures in place. Otherwise clear oropharynx. No midline C-spine TTP, stepoffs, or deformities. Cardiology: RRR, no murmurs/rubs/gallops.  Resp: Normal respiratory rate and effort. CTAB, no wheezes, rhonchi, crackles.  Abd: Soft, non-tender, non-distended. No rebound tenderness or guarding.  Pelvic: pelvis stable/nontender MSK: No peripheral edema or signs of trauma. Extremities without deformity or TTP. No cyanosis or clubbing. Skin: warm, dry.  Back: No T or L spine tenderness to palpation or stepoffs. No CVA TTP.  Neuro: Sleepy, requires constant attention to stay awake during interview. A&Ox4, CNs II-XII grossly intact. MAEs. Sensation grossly intact.    ED Results / Procedures / Treatments   Labs (all labs ordered are listed, but only abnormal results are displayed) Labs Reviewed  CBC WITH DIFFERENTIAL/PLATELET - Abnormal; Notable for the following components:      Result Value   RBC 3.71 (*)    Hemoglobin 11.1 (*)    HCT 33.1 (*)    All other components within normal limits  COMPREHENSIVE METABOLIC PANEL - Abnormal; Notable for the following components:   Glucose, Bld 203 (*)    Total Protein 5.6 (*)    Albumin 3.1 (*)    All other components within normal  limits  URINALYSIS, ROUTINE W REFLEX MICROSCOPIC - Abnormal; Notable for the following components:   Glucose, UA 50 (*)    All other components within normal limits  CBG MONITORING, ED - Abnormal; Notable for the following components:   Glucose-Capillary 167 (*)    All other components within normal limits  I-STAT VENOUS BLOOD GAS, ED - Abnormal; Notable for the following components:   pCO2, Ven 35.5 (*)    pO2, Ven 103 (*)    Calcium, Ion 1.05 (*)    HCT 26.0 (*)    Hemoglobin 8.8 (*)    All other components within normal limits  CBG MONITORING, ED - Abnormal; Notable for the following components:   Glucose-Capillary 121 (*)    All other components within normal limits  COMPREHENSIVE METABOLIC PANEL  CBC    EKG EKG Interpretation Date/Time:  Tuesday November 14 2023 16:08:09 EST Ventricular Rate:  69 PR Interval:  186 QRS Duration:  103 QT Interval:  437 QTC Calculation: 469 R Axis:   -24  Text Interpretation: Sinus rhythm Atrial premature complexes Borderline left axis deviation Low voltage, precordial leads Confirmed by Vivi Barrack [phone]) on 11/14/2023 7:18:25 PM  Radiology CT Head Wo Contrast Result Date: 11/14/2023 CLINICAL DATA:  Provided history: Head trauma,  minor. Neck trauma. Additional history obtained from electronic MEDICAL RECORD NUMBERFall (face first), upper lip pain. EXAM: CT HEAD WITHOUT CONTRAST CT CERVICAL SPINE WITHOUT CONTRAST TECHNIQUE: Multidetector CT imaging of the head and cervical spine was performed following the standard protocol without intravenous contrast. Multiplanar CT image reconstructions of the cervical spine were also generated. RADIATION DOSE REDUCTION: This exam was performed according to the departmental dose-optimization program which includes automated exposure control, adjustment of the mA and/or kV according to patient size and/or use of iterative reconstruction technique. COMPARISON:  Head CT 10/11/2023. Cervical spine MRI 09/03/2022.  CTA head/neck 07/26/2017. FINDINGS: CT HEAD FINDINGS Brain: Generalized cerebral and cerebellar atrophy. Patchy and ill-defined hypoattenuation within the cerebral white matter, nonspecific but compatible with mild chronic small vessel ischemic disease. Known chronic infarct within the superior right cerebellar hemisphere (SCA vascular territory). There is no acute intracranial hemorrhage. No demarcated cortical infarct. No extra-axial fluid collection. No evidence of an intracranial mass. No midline shift. Vascular: No hyperdense vessel. Atherosclerotic calcifications. Skull: No calvarial fracture or aggressive osseous lesion. Sinuses/Orbits: No mass or acute finding within the imaged orbits. Minimal mucosal thickening within the bilateral ethmoid and left frontal sinuses. CT CERVICAL SPINE FINDINGS Alignment: 2-3 mm grade 1 retrolisthesis at C4-C5, C5-C6 and C6-C7. Skull base and vertebrae: The basion-dental and atlanto-dental intervals are maintained.No evidence of acute fracture to the cervical spine. Soft tissues and spinal canal: No prevertebral fluid or swelling. No visible canal hematoma. Disc levels: Cervical spondylosis with multilevel disc space narrowing, disc bulges/central disc protrusions, posterior disc osteophyte complexes, uncovertebral hypertrophy and facet arthrosis. Disc space narrowing is greatest at C3-C4 (moderate to advanced) and C6-C7 (advanced). No appreciable high-grade spinal canal stenosis. Multilevel bony neural foraminal narrowing. Upper chest: No consolidation within the imaged lung apices. No visible pneumothorax. 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 Partially imaged, known left thyroid lobe nodule. By report, this nodule has been previously biopsied. Correlate with the pathology result from this procedure. IMPRESSION: CT head: 1.  No evidence of an acute intracranial abnormality. 2. Parenchymal atrophy, chronic  small vessel ischemic disease and chronic right cerebellar infarct as described. CT cervical spine: 1. No evidence of an acute cervical spine fracture. 2. Grade 1 retrolisthesis at C4-C5, C5-C6 and C6-C7. 3. Cervical spondylosis as described. 4. 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 Electronically Signed   By: Jackey Loge D.O.   On: 11/14/2023 16:53   CT Cervical Spine Wo Contrast Result Date: 11/14/2023 CLINICAL DATA:  Provided history: Head trauma, minor. Neck trauma. Additional history obtained from electronic MEDICAL RECORD NUMBERFall (face first), upper lip pain. EXAM: CT HEAD WITHOUT CONTRAST CT CERVICAL SPINE WITHOUT CONTRAST TECHNIQUE: Multidetector CT imaging of the head and cervical spine was performed following the standard protocol without intravenous contrast. Multiplanar CT image reconstructions of the cervical spine were also generated. RADIATION DOSE REDUCTION: This exam was performed according to the departmental dose-optimization program which includes automated exposure control, adjustment of the mA and/or kV according to patient size and/or use of iterative reconstruction technique. COMPARISON:  Head CT 10/11/2023. Cervical spine MRI 09/03/2022. CTA head/neck 07/26/2017. FINDINGS: CT HEAD FINDINGS Brain: Generalized cerebral and cerebellar atrophy. Patchy and ill-defined hypoattenuation within the cerebral white matter, nonspecific but compatible with mild chronic small vessel ischemic disease. Known chronic infarct within the superior right cerebellar hemisphere (SCA vascular territory). There is no acute  intracranial hemorrhage. No demarcated cortical infarct. No extra-axial fluid collection. No evidence of an intracranial mass. No midline shift. Vascular: No hyperdense vessel. Atherosclerotic calcifications. Skull: No calvarial fracture or aggressive osseous lesion. Sinuses/Orbits: No mass or acute finding within the imaged  orbits. Minimal mucosal thickening within the bilateral ethmoid and left frontal sinuses. CT CERVICAL SPINE FINDINGS Alignment: 2-3 mm grade 1 retrolisthesis at C4-C5, C5-C6 and C6-C7. Skull base and vertebrae: The basion-dental and atlanto-dental intervals are maintained.No evidence of acute fracture to the cervical spine. Soft tissues and spinal canal: No prevertebral fluid or swelling. No visible canal hematoma. Disc levels: Cervical spondylosis with multilevel disc space narrowing, disc bulges/central disc protrusions, posterior disc osteophyte complexes, uncovertebral hypertrophy and facet arthrosis. Disc space narrowing is greatest at C3-C4 (moderate to advanced) and C6-C7 (advanced). No appreciable high-grade spinal canal stenosis. Multilevel bony neural foraminal narrowing. Upper chest: No consolidation within the imaged lung apices. No visible pneumothorax. 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 Partially imaged, known left thyroid lobe nodule. By report, this nodule has been previously biopsied. Correlate with the pathology result from this procedure. IMPRESSION: CT head: 1.  No evidence of an acute intracranial abnormality. 2. Parenchymal atrophy, chronic small vessel ischemic disease and chronic right cerebellar infarct as described. CT cervical spine: 1. No evidence of an acute cervical spine fracture. 2. Grade 1 retrolisthesis at C4-C5, C5-C6 and C6-C7. 3. Cervical spondylosis as described. 4. 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 Electronically Signed   By: Jackey Loge D.O.   On: 11/14/2023 16:53    Procedures Procedures    Medications Ordered in ED Medications  atorvastatin (LIPITOR) tablet 40 mg (has no administration in time range)  metoprolol succinate (TOPROL-XL) 24 hr tablet 50 mg (has no administration in time range)  levothyroxine (SYNTHROID)  tablet 25 mcg (has no administration in time range)  apixaban (ELIQUIS) tablet 5 mg (has no administration in time range)  ferrous sulfate tablet 325 mg (has no administration in time range)  sodium chloride flush (NS) 0.9 % injection 3 mL (has no administration in time range)  sodium chloride flush (NS) 0.9 % injection 3 mL (has no administration in time range)  sodium chloride flush (NS) 0.9 % injection 3 mL (has no administration in time range)  0.9 %  sodium chloride infusion (has no administration in time range)  acetaminophen (TYLENOL) tablet 650 mg (has no administration in time range)    Or  acetaminophen (TYLENOL) suppository 650 mg (has no administration in time range)  senna-docusate (Senokot-S) tablet 1 tablet (has no administration in time range)  ondansetron (ZOFRAN) tablet 4 mg (has no administration in time range)    Or  ondansetron (ZOFRAN) injection 4 mg (has no administration in time range)  insulin aspart (novoLOG) injection 0-6 Units (has no administration in time range)  insulin aspart (novoLOG) injection 0-5 Units (has no administration in time range)    ED Course/ Medical Decision Making/ A&P                          Medical Decision Making Amount and/or Complexity of Data Reviewed Labs: ordered. Decision-making details documented in ED Course. Radiology: ordered. Decision-making details documented in ED Course.  Risk Decision regarding hospitalization.    This patient presents to the ED for concern of fall and sleepiness, this  involves an extensive number of treatment options, and is a complaint that carries with it a high risk of complications and morbidity.  I considered the following differential and admission for this acute, potentially life threatening condition.   MDM:    DDX for trauma includes but is not limited to:  -Head Injury such as skull fx or ICH -Spinal Cord or Vertebral injury - no spinal TTP,  -Fractures - no extremity  deformities/pain, no chest wall or pelvic TTP.   Ddx of acute altered mental status or encephalopathy considered but not limited to: -Intracranial abnormalities such as ICH, hydrocephalus, head trauma -Infection such as UTI, PNA -Toxic ingestion such as polypharmacy -Electrolyte abnormalities or hyper/hypoglycemia -Hypercarbia or hypoxia -ACS or arrhythmia   Clinical Course as of 11/14/23 2330  Tue Nov 14, 2023  1711 CT Head Wo Contrast CT head:  1.  No evidence of an acute intracranial abnormality. 2. Parenchymal atrophy, chronic small vessel ischemic disease and chronic right cerebellar infarct as described.  CT cervical spine:  1. No evidence of an acute cervical spine fracture. 2. Grade 1 retrolisthesis at C4-C5, C5-C6 and C6-C7. 3. Cervical spondylosis as described. 4. 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   [HN]  1757 pCO2, Ven(!): 35.5 No hypercarbia [HN]  2006 Urinalysis, Routine w reflex microscopic -Urine, Clean Catch(!) No UTI [HN]  2240 RN tried to get patient up to walk him but patient continuously falling asleep. Unable to get patient to ambulate, or even stay awake for a few minutes. Consider possible polypharmacy but will likely have to admit patient d/t somnolence. [HN]    Clinical Course User Index [HN] Loetta Rough, MD    Labs: I Ordered, and personally interpreted labs.  The pertinent results include:  those listed above  Imaging Studies ordered: I ordered imaging studies including CTH, CT C-spine, CXR I independently visualized and interpreted imaging. I agree with the radiologist interpretation  Additional history obtained from chart review, daughter at bedside.    Cardiac Monitoring: The patient was maintained on a cardiac monitor.  I personally viewed and interpreted the cardiac monitored which showed an underlying rhythm of: NSR  Reevaluation: After the interventions  noted above, I reevaluated the patient and found that they have :improved  Social Determinants of Health: Lives independently  Disposition:  admit  Co morbidities that complicate the patient evaluation  Past Medical History:  Diagnosis Date   Adenomatous colon polyp    Allergy    Anal fissure    Arthritis    BPH (benign prostatic hyperplasia)    Diabetes mellitus (HCC)    GERD (gastroesophageal reflux disease)    Hyperlipidemia    Hypertension    Hypothyroidism    Other testicular hypofunction    Stroke (HCC)      Medicines Meds ordered this encounter  Medications   atorvastatin (LIPITOR) tablet 40 mg    Take  1 tablet  Daily for Cholesterol                                                       /  TAKE                                                       BY                                           MOUTH     metoprolol succinate (TOPROL-XL) 24 hr tablet 50 mg   levothyroxine (SYNTHROID) tablet 25 mcg   apixaban (ELIQUIS) tablet 5 mg   ferrous sulfate tablet 325 mg   sodium chloride flush (NS) 0.9 % injection 3 mL   sodium chloride flush (NS) 0.9 % injection 3 mL   sodium chloride flush (NS) 0.9 % injection 3 mL   0.9 %  sodium chloride infusion   OR Linked Order Group    acetaminophen (TYLENOL) tablet 650 mg    acetaminophen (TYLENOL) suppository 650 mg   senna-docusate (Senokot-S) tablet 1 tablet   OR Linked Order Group    ondansetron (ZOFRAN) tablet 4 mg    ondansetron (ZOFRAN) injection 4 mg   insulin aspart (novoLOG) injection 0-6 Units    Correction coverage::   Very Sensitive (ESRD/Dialysis)    CBG < 70::   Implement Hypoglycemia Standing Orders and refer to Hypoglycemia Standing Orders sidebar report    CBG 70 - 120::   0 units    CBG 121 - 150::   0 units    CBG 151 - 200::   1 unit    CBG 201-250::   2 units    CBG 251-300::   3 units    CBG 301-350::   4 units    CBG 351-400::   5 units    CBG > 400:   Give 6 units and  call MD   insulin aspart (novoLOG) injection 0-5 Units    Correction coverage::   HS scale    CBG < 70::   Implement Hypoglycemia Standing Orders and refer to Hypoglycemia Standing Orders sidebar report    CBG 70 - 120::   0 units    CBG 121 - 150::   0 units    CBG 151 - 200::   0 units    CBG 201 - 250::   2 units    CBG 251 - 300::   3 units    CBG 301 - 350::   4 units    CBG 351 - 400::   5 units    CBG > 400:   call MD and obtain STAT lab verification    I have reviewed the patients home medicines and have made adjustments as needed  Problem List / ED Course: Problem List Items Addressed This Visit   None Visit Diagnoses       Fall in home, initial encounter    -  Primary     Somnolence                       This note was created using dictation software, which may contain spelling or grammatical errors.    Loetta Rough, MD 11/18/23 [phone]

## 2023-11-14 NOTE — H&P (Signed)
History and Physical    Earl [name] ZOX:[id number] DOB: Jun 29, 1941 DOA: 11/14/2023  PCP: [name], MD   Patient coming from: Home   Chief Complaint:  Chief Complaint  Patient presents with   Fall    On thinners   ED TRIAGE note:Pt BIB by EMS from home. A&Ox4. Pt walking around house and suddenly fell face first onto carpet. Denies pain except for upper lip pain. Denies N/V/D. Took meds this morning. Pt is on eliquis.  HPI:  Earl [name] is a 82 y.o. 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  Patient reported that he suddenly fell on his face onto a carpet.  Denies any injury, pain and loss of consciousness.  During my evaluation in the [facility] patient is currently alert oriented x 3.  However he reported generalized weakness and poor appetite.  Patient's daughter at the bedside reported that patient has muscle spasm and patient has been recently started on baclofen and also receiving subcu injection Amvutra every 3 months for the management of amyloidosis induced polyneuropathy.  Patient also has pressure ulcer of the buttock, bilateral lower extremities and left-sided heel.  Patient's daughter at the bedside reported that home health nurse visiting him only once a week which is not sufficient for taking care of his father.    Family requesting for home health nurse visit at least 2-3 times weekly for pressure ulcer care and requesting for home PT and OT  management as well.   ED Course:  At presentation to ED patient found hypotensive 88/54, heart rate 87, O2 sat 100% room air. Blood glucose within normal range.  UA unremarkable. CBC stable H&H. CMP showed elevated blood glucose 203 otherwise unremarkable. EKG showing sinus rhythm heart rate 69.  Premature atrial complex.  CT cervical spine and CT head: 1. No evidence of an acute cervical spine fracture. 2. Grade 1 retrolisthesis at C4-C5, C5-C6 and C6-C7. 3. Cervical spondylosis as described. 4. 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  ED physician reported that at the bedside patient is very somnolent however able to wake him up with noxious stimuli and patient is able to follow verbal command however patient has generalized weakness and unable to walk the patient in the room.  Patient's daughter at the bedside reported that patient has bilateral lower extremity muscle spasm recently has been started on baclofen outpatient and since then patient has more sleepy/somnolence.  Family is not comfortable to take patient home until patient sees more going up.  Marland KitchenHospitalist has been contacted for further evaluation management of acute metabolic encephalopathy likely in the setting of baclofen use, mechanical fall, generalized weakness and failure to thrive in adult.   Significant labs in the ED: Lab Orders         CBC with Differential         Comprehensive metabolic panel         Urinalysis, Routine w reflex microscopic -Urine, Clean Catch         Comprehensive metabolic panel         CBC  Ammonia         Vitamin B12         TSH         POC CBG, ED         I-Stat venous blood gas, ([facility] ED, [facility], [facility])         CBG monitoring, ED       Review of Systems:  Review of Systems  Constitutional:  Positive for malaise/fatigue. Negative for chills, fever and weight loss.  Respiratory:  Negative for cough, sputum  production and shortness of breath.   Cardiovascular:  Negative for chest pain, palpitations, claudication and leg swelling.  Gastrointestinal:  Negative for abdominal pain, heartburn, nausea and vomiting.  Genitourinary:  Negative for dysuria, frequency and urgency.  Musculoskeletal:  Positive for falls. Negative for back pain, joint pain, myalgias and neck pain.  Neurological:  Positive for tingling and sensory change. Negative for dizziness, tremors, speech change, focal weakness, loss of consciousness, weakness and headaches.  Psychiatric/Behavioral:  The patient is not nervous/anxious.     Past Medical History:  Diagnosis Date   Adenomatous colon polyp    Allergy    Anal fissure    Arthritis    BPH (benign prostatic hyperplasia)    Diabetes mellitus (HCC)    GERD (gastroesophageal reflux disease)    Hyperlipidemia    Hypertension    Hypothyroidism    Other testicular hypofunction    Stroke [facility])     Past Surgical History:  Procedure Laterality Date   CIRCUMCISION N/A 06/11/2019   Procedure: CIRCUMCISION ADULT;  Surgeon: Jerilee Field, MD;  Location: [facility] ORS;  Service: Urology;  Laterality: N/A;   COLONOSCOPY     INGUINAL HERNIA REPAIR Bilateral    1968 and 2004   LOOP RECORDER INSERTION N/A 07/28/2017   Procedure: LOOP RECORDER INSERTION;  Surgeon: Duke Salvia, MD;  Location: [facility] INVASIVE CV LAB;  Service: Cardiovascular;  Laterality: N/A;   RIGHT/LEFT HEART CATH AND CORONARY ANGIOGRAPHY N/A 10/07/2021   Procedure: RIGHT/LEFT HEART CATH AND CORONARY ANGIOGRAPHY;  Surgeon: Laurey Morale, MD;  Location: [facility] INVASIVE CV LAB;  Service: Cardiovascular;  Laterality: N/A;   TEE WITHOUT CARDIOVERSION N/A 07/28/2017   Procedure: TRANSESOPHAGEAL ECHOCARDIOGRAM (TEE);  Surgeon: Quintella Reichert, MD;  Location: [facility] ENDOSCOPY;  Service: Cardiovascular;  Laterality: N/A;     reports that he has never smoked. He has never used smokeless tobacco. He reports that he does not drink  alcohol and does not use drugs.  Allergies  Allergen Reactions   Penicillins Other (See Comments)    Did it involve swelling of the face/tongue/throat, SOB, or low BP? Unknown Did it involve sudden or severe rash/hives, skin peeling, or any reaction on the inside of your mouth or nose? Unknown Did you need to seek medical attention at a hospital or doctor's office? No When did it last happen?    Over 39 Years Ago   If all above answers are "NO", may proceed with cephalosporin use.      Family History  Problem Relation Age of Onset   Sickle cell anemia Mother    Diabetes Father    Stomach cancer Father    Colon cancer Father    CAD Neg Hx    Esophageal cancer Neg Hx    Rectal cancer Neg Hx     Prior to Admission medications   Medication Sig Start Date End Date Taking? Authorizing Provider  AMVUTTRA 25 MG/0.5ML syringe INJECT 25MG  (0.5ML) SUBCUTANEOUSLY ONCE EVERY  3 MONTHS. ROTATE ADMINISTRATION SITE WITH EACH INJECTION. *REFRIGERATE. DO NOT SHAKE.* 10/24/23   Laurey Morale, MD  atorvastatin (LIPITOR) 40 MG tablet Take  1 tablet  Daily for Cholesterol                                                       /                                  TAKE                                                       BY                                           MOUTH 04/15/23   [name], MD  baclofen 5 MG TABS Take 1 tablet (5 mg total) by mouth 2 (two) times daily. 10/12/23   Nita Sickle K, DO  Blood Glucose Monitoring Suppl (ONE TOUCH ULTRA 2) w/Device KIT Check blood sugar 1 time a day-DX-E11.9 03/23/23   [name], MD  Cholecalciferol (VITAMIN D PO) Take 1 tablet by mouth daily.     [provider]  clotrimazole-betamethasone (LOTRISONE) cream Apply to affected area 2 to 4 x / day 05/17/23 05/16/24  [name], NP  Continuous Glucose Receiver (FREESTYLE LIBRE 3 READER) DEVI Use with freestyle libre sensor 10/21/23   [name], NP  Continuous Glucose Sensor  (FREESTYLE LIBRE 3 SENSOR) MISC Place 1 sensor on the skin every 14 days. Use to check glucose continuously 10/21/23   [name], NP  Cyanocobalamin (B-12) 50 MCG TABS Take 50 mcg by mouth daily.    [provider]  ELIQUIS 5 MG TABS tablet TAKE 1 TABLET BY MOUTH TWICE  DAILY TO PREVENT BLOOD CLOTS 11/09/23   [name], NP  Emollient (AQUAPHOR ADV PROTECT HEALING) OINT Apply small amount to two areas on buttocks daily to prevent breakdown 07/19/23   [name], NP  ferrous sulfate 325 (65 FE) MG tablet Take 325 mg by mouth at bedtime.    [provider]  finasteride (PROSCAR) 5 MG tablet TAKE 1 TABLET BY MOUTH DAILY FOR PROSTATE 09/15/23   [name], NP  glipiZIDE (GLUCOTROL) 5 MG tablet Take 1/2 to 1 tablet 3 x / day with Meals for Diabetes 11/01/23   [name], MD  glucose blood [facility] ULTRA) test strip Use as instructed 03/23/23   [name], MD  Lancets [facility] DELICA PLUS [place]) MISC CHECK BLOOD SUGAR ONCE A DAY. 03/23/23   [name], MD  Lancets Misc. (ONE TOUCH SURESOFT) MISC Check blood sugar 1 time a day-DX-E11.91 03/23/23   [name], MD  levothyroxine (SYNTHROID) 50 MCG tablet TAKE 1 TABLET BY MOUTH DAILY ON  AN EMPTY STOMACH WITH ONLY WATER FOR 30 MINUTES AND NO ANTACID,  MEDS, CALCIUM OR MAGNESIUM FOR 4 HOURS AND AVOID BIOTIN 05/23/23   [name], NP  Magnesium 250 MG TABS Take 250 mg by mouth daily.     [provider]  metFORMIN (GLUCOPHAGE-XR) 500 MG 24 hr tablet TAKE 2 TABLETS BY MOUTH TWICE  DAILY WITH MEALS FOR DIABETES 07/31/23   [name], NP  metoprolol succinate (TOPROL-XL) 50 MG 24 hr tablet TAKE 1 TABLET BY MOUTH AT  BEDTIME 10/31/23   Laurey Morale, MD  Multiple Vitamin (MULTIVITAMIN) capsule Take 1 capsule by mouth daily.    [provider]  NONFORMULARY OR COMPOUNDED ITEM Please dispense a Rolling Walker.  Dx:R26.81 & G63 12/08/22   Nita Sickle K, DO  potassium  chloride SA (KLOR-CON M20) 20 MEQ tablet Take 1 tablet by mouth once daily 09/25/23   [place], Anderson Malta, FNP  sacubitril-valsartan (ENTRESTO) 24-26 MG Take 1 tablet by mouth 2 (two) times daily. 02/21/23   Laurey Morale, MD  spironolactone (ALDACTONE) 25 MG tablet Take 1 tablet (25 mg total) by mouth at bedtime. 11/30/22   Laurey Morale, MD  Tafamidis [facility]) 61 MG CAPS Take 1 capsule by mouth daily. 04/12/23   Laurey Morale, MD  torsemide (DEMADEX) 20 MG tablet Take  1 tablet   2 x /day  for Fluid Retention & Leg / Ankle Swelling 03/23/23   [name], MD  triamcinolone cream (KENALOG) 0.1 % Apply 1 Application topically as needed.    [provider]  trolamine salicylate (ASPERCREME) 10 % cream Apply 1 application topically daily as needed for muscle pain.    [provider]  VITAMIN A PO Take by mouth. 3000 mg    [provider]  vitamin C (ASCORBIC ACID) 500 MG tablet Take 500 mg by mouth daily.    [provider]     Physical Exam: Vitals:   11/14/23 1800 11/14/23 1844 11/14/23 2030 11/14/23 2100  BP: (!) 101/51  (!) 111/50 99/66  Pulse: 76  81 72  Resp: (!) 21  14 16   Temp:  98.1 F (36.7 C)    TempSrc:  Oral    SpO2: 100%  100% 100%  Weight:      Height:        Physical Exam Constitutional:      General: He is not in acute distress.    Appearance: He is ill-appearing.  HENT:     Mouth/Throat:     Mouth: Mucous membranes are moist.  Eyes:     Conjunctiva/sclera: Conjunctivae normal.  Cardiovascular:     Rate and Rhythm: Normal rate and regular rhythm.     Pulses: Normal pulses.     Heart sounds: Normal heart sounds.  Pulmonary:     Effort: Pulmonary effort is normal.     Breath sounds: Normal breath sounds.  Abdominal:     General: Bowel sounds are normal.  Musculoskeletal:        General: No deformity. Normal range of motion.     Cervical back: Neck supple.     Right lower leg: No edema.     Left lower leg: No  edema.     Comments: Left-sided heel pressure ulcer  Skin:    Capillary Refill: Capillary refill takes less than 2 seconds.  Neurological:     Mental Status: He is alert and oriented to person, place, and time.     Sensory: Sensory deficit present.     Motor: Weakness present.     Comments: Chronic weakness and sensory deficit of bilateral lower and upper extremities.  Psychiatric:  Mood and Affect: Mood normal.        Behavior: Behavior normal.        Thought Content: Thought content normal.        Judgment: Judgment normal.      Labs on Admission: I have personally reviewed following labs and imaging studies  CBC: Recent Labs  Lab 11/14/23 1635 11/14/23 1750  WBC 7.1  --   NEUTROABS 5.2  --   HGB 11.1* 8.8*  HCT 33.1* 26.0*  MCV 89.2  --   PLT 207  --    Basic Metabolic Panel: Recent Labs  Lab 11/14/23 1635 11/14/23 1750  NA 142 144  K 4.1 3.6  CL 108  --   CO2 25  --   GLUCOSE 203*  --   BUN 20  --   CREATININE 0.98  --   CALCIUM 9.1  --    GFR: Estimated Creatinine Clearance: 57.5 mL/min (by C-G formula based on SCr of 0.98 mg/dL). Liver Function Tests: Recent Labs  Lab 11/14/23 1635  AST 40  ALT 33  ALKPHOS 59  BILITOT 0.5  PROT 5.6*  ALBUMIN 3.1*   No results for input(s): "LIPASE", "AMYLASE" in the last 168 hours. No results for input(s): "AMMONIA" in the last 168 hours. Coagulation Profile: No results for input(s): "INR", "PROTIME" in the last 168 hours. Cardiac Enzymes: No results for input(s): "CKTOTAL", "CKMB", "CKMBINDEX", "TROPONINI", "TROPONINIHS" in the last 168 hours. BNP (last 3 results) Recent Labs    08/23/23 1541  BNP 99.2   HbA1C: No results for input(s): "HGBA1C" in the last 72 hours. CBG: Recent Labs  Lab 11/14/23 1607 11/14/23 2236  GLUCAP 167* 121*   Lipid Profile: No results for input(s): "CHOL", "HDL", "LDLCALC", "TRIG", "CHOLHDL", "LDLDIRECT" in the last 72 hours. Thyroid Function Tests: No results for  input(s): "TSH", "T4TOTAL", "FREET4", "T3FREE", "THYROIDAB" in the last 72 hours. Anemia Panel: No results for input(s): "VITAMINB12", "FOLATE", "FERRITIN", "TIBC", "IRON", "RETICCTPCT" in the last 72 hours. Urine analysis:    Component Value Date/Time   COLORURINE YELLOW 11/14/2023 1953   APPEARANCEUR CLEAR 11/14/2023 1953   LABSPEC 1.013 11/14/2023 1953   PHURINE 6.0 11/14/2023 1953   GLUCOSEU 50 (A) 11/14/2023 1953   HGBUR NEGATIVE 11/14/2023 1953   BILIRUBINUR NEGATIVE 11/14/2023 1953   KETONESUR NEGATIVE 11/14/2023 1953   PROTEINUR NEGATIVE 11/14/2023 1953   UROBILINOGEN 0.2 05/05/2015 1443   NITRITE NEGATIVE 11/14/2023 1953   LEUKOCYTESUR NEGATIVE 11/14/2023 1953    Radiological Exams on Admission: I have personally reviewed images CT Head Wo Contrast Result Date: 11/14/2023 CLINICAL DATA:  Provided history: Head trauma, minor. Neck trauma. Additional history obtained from electronic MEDICAL RECORD NUMBERFall (face first), upper lip pain. EXAM: CT HEAD WITHOUT CONTRAST CT CERVICAL SPINE WITHOUT CONTRAST TECHNIQUE: Multidetector CT imaging of the head and cervical spine was performed following the standard protocol without intravenous contrast. Multiplanar CT image reconstructions of the cervical spine were also generated. RADIATION DOSE REDUCTION: This exam was performed according to the departmental dose-optimization program which includes automated exposure control, adjustment of the mA and/or kV according to patient size and/or use of iterative reconstruction technique. COMPARISON:  Head CT 10/11/2023. Cervical spine MRI 09/03/2022. CTA head/neck 07/26/2017. FINDINGS: CT HEAD FINDINGS Brain: Generalized cerebral and cerebellar atrophy. Patchy and ill-defined hypoattenuation within the cerebral white matter, nonspecific but compatible with mild chronic small vessel ischemic disease. Known chronic infarct within the superior right cerebellar hemisphere (SCA vascular territory). There is no  acute intracranial hemorrhage.  No demarcated cortical infarct. No extra-axial fluid collection. No evidence of an intracranial mass. No midline shift. Vascular: No hyperdense vessel. Atherosclerotic calcifications. Skull: No calvarial fracture or aggressive osseous lesion. Sinuses/Orbits: No mass or acute finding within the imaged orbits. Minimal mucosal thickening within the bilateral ethmoid and left frontal sinuses. CT CERVICAL SPINE FINDINGS Alignment: 2-3 mm grade 1 retrolisthesis at C4-C5, C5-C6 and C6-C7. Skull base and vertebrae: The basion-dental and atlanto-dental intervals are maintained.No evidence of acute fracture to the cervical spine. Soft tissues and spinal canal: No prevertebral fluid or swelling. No visible canal hematoma. Disc levels: Cervical spondylosis with multilevel disc space narrowing, disc bulges/central disc protrusions, posterior disc osteophyte complexes, uncovertebral hypertrophy and facet arthrosis. Disc space narrowing is greatest at C3-C4 (moderate to advanced) and C6-C7 (advanced). No appreciable high-grade spinal canal stenosis. Multilevel bony neural foraminal narrowing. Upper chest: No consolidation within the imaged lung apices. No visible pneumothorax. 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 Partially imaged, known left thyroid lobe nodule. By report, this nodule has been previously biopsied. Correlate with the pathology result from this procedure. IMPRESSION: CT head: 1.  No evidence of an acute intracranial abnormality. 2. Parenchymal atrophy, chronic small vessel ischemic disease and chronic right cerebellar infarct as described. CT cervical spine: 1. No evidence of an acute cervical spine fracture. 2. Grade 1 retrolisthesis at C4-C5, C5-C6 and C6-C7. 3. Cervical spondylosis as described. 4. 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  Electronically Signed   By: Jackey Loge D.O.   On: 11/14/2023 16:53   CT Cervical Spine Wo Contrast Result Date: 11/14/2023 CLINICAL DATA:  Provided history: Head trauma, minor. Neck trauma. Additional history obtained from electronic MEDICAL RECORD NUMBERFall (face first), upper lip pain. EXAM: CT HEAD WITHOUT CONTRAST CT CERVICAL SPINE WITHOUT CONTRAST TECHNIQUE: Multidetector CT imaging of the head and cervical spine was performed following the standard protocol without intravenous contrast. Multiplanar CT image reconstructions of the cervical spine were also generated. RADIATION DOSE REDUCTION: This exam was performed according to the departmental dose-optimization program which includes automated exposure control, adjustment of the mA and/or kV according to patient size and/or use of iterative reconstruction technique. COMPARISON:  Head CT 10/11/2023. Cervical spine MRI 09/03/2022. CTA head/neck 07/26/2017. FINDINGS: CT HEAD FINDINGS Brain: Generalized cerebral and cerebellar atrophy. Patchy and ill-defined hypoattenuation within the cerebral white matter, nonspecific but compatible with mild chronic small vessel ischemic disease. Known chronic infarct within the superior right cerebellar hemisphere (SCA vascular territory). There is no acute intracranial hemorrhage. No demarcated cortical infarct. No extra-axial fluid collection. No evidence of an intracranial mass. No midline shift. Vascular: No hyperdense vessel. Atherosclerotic calcifications. Skull: No calvarial fracture or aggressive osseous lesion. Sinuses/Orbits: No mass or acute finding within the imaged orbits. Minimal mucosal thickening within the bilateral ethmoid and left frontal sinuses. CT CERVICAL SPINE FINDINGS Alignment: 2-3 mm grade 1 retrolisthesis at C4-C5, C5-C6 and C6-C7. Skull base and vertebrae: The basion-dental and atlanto-dental intervals are maintained.No evidence of acute fracture to the cervical spine. Soft tissues and spinal  canal: No prevertebral fluid or swelling. No visible canal hematoma. Disc levels: Cervical spondylosis with multilevel disc space narrowing, disc bulges/central disc protrusions, posterior disc osteophyte complexes, uncovertebral hypertrophy and facet arthrosis. Disc space narrowing is greatest at C3-C4 (moderate to advanced) and C6-C7 (advanced). No appreciable high-grade spinal canal stenosis. Multilevel bony neural foraminal narrowing. Upper  chest: No consolidation within the imaged lung apices. No visible pneumothorax. 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 Partially imaged, known left thyroid lobe nodule. By report, this nodule has been previously biopsied. Correlate with the pathology result from this procedure. IMPRESSION: CT head: 1.  No evidence of an acute intracranial abnormality. 2. Parenchymal atrophy, chronic small vessel ischemic disease and chronic right cerebellar infarct as described. CT cervical spine: 1. No evidence of an acute cervical spine fracture. 2. Grade 1 retrolisthesis at C4-C5, C5-C6 and C6-C7. 3. Cervical spondylosis as described. 4. 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 Electronically Signed   By: Jackey Loge D.O.   On: 11/14/2023 16:53     EKG: My personal interpretation of EKG shows: Sinus rhythm heart rate 69.  And atrial premature complex.  There is no ST anterior abnormality.    Assessment/Plan: Principal Problem:   Acute metabolic encephalopathy Active Problems:   Paroxysmal atrial fibrillation (HCC)   Failure to thrive in adult   Generalized weakness   Fall at home, initial encounter   Pressure ulcer   Muscle spasms of both lower extremities   MGUS (monoclonal gammopathy of unknown significance)   Chronic anemia   Hyperlipidemia   Non-insulin dependent type 2 diabetes mellitus (HCC)   BPH (benign prostatic  hyperplasia)   Hypothyroidism   History of CVA (cerebrovascular accident)   Chronic systolic CHF (congestive heart failure) (HCC)    Assessment and Plan: Acute metabolic encephalopathy-improving > Patient presenting from home with some confusion and sleepiness.  Daughter at the bedside reported that patient has more sleepiness as compared to his baseline since patient has been taking baclofen for muscle spasm. - Hypertension patient to ED patient found hypotensive however blood pressure remained within the low normal range. -CBC and CMP grossly unremarkable for any new change. - CT head unremarkable. -CT cervical spine showed cervical spondylosis and grade 1 retrolisthesis at C4-C5, C5-C6 and C6-C7.  -Currently patient is alert oriented x 3.  Patient does not have any confusion and at baseline. - Checking ammonia, vitamin B12 and TSH level. -Concern for acute metabolic encephalopathy in the setting of baclofen use.  Reducing baclofen dose to 2.5 twice daily from 5 mg to prevent baclofen withdrawal.  Continue to monitor mentation. - Continue neurocheck every 4 hours - Continue aspiration precaution - Continue cardiac monitoring overnight.   Failure to thrive in adult Generalized weakness Mechanical fall -Patient presenting with mechanical fall at home.  Denies hitting of the head and loss of consciousness.  Head CT and CT cervical spine no evidence of cervical fracture dislocation or no intracranial abnormality. 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  Family reported that patient is gradually declining physically and requesting for help to get him back to his feet. -At baseline patient uses walker at home however due to pressure ulcer of the left heel unable to walk at home by himself. - Family requesting for  home health nurse care and home PT and OT. -Will consult PT and OT for evaluation of balance. - Consulted transition care team for home health nurse arrangement for pressure ulcer wound care as well as home health for home PT and OT.   Pressure  ulcer of buttock and left-sided heel -Patient has multiple pressure ulcer of his buttock and left-sided heel. - Consulted inpatient wound care for pressure ulcer management.  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  Currently on antitransthyretin injection every 3 months and baclofen 5 mg twice daily. -In the setting of recent fall and acute metabolic encephalopathy decreasing dose of baclofen to 2.5 2.5 mg twice daily.   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 -Additional finding.  Continue to monitor.  Chronic systolic heart failure reduced EF 40 to 45% History of cardiac amyloidosis -Patient's blood pressure is borderline since patient in the ED.  Systolic up to 11 and diastolic up to 59 maximum. -At home patient is on Entresto, spironolactone and torsemide.  Will resume home blood pressure regimen gradually based blood pressure trend.  Paroxysmal atrial fibrillation -Continue Toprol-XL 50 mg daily.Heart rate in between 60-80. -Continue Eliquis 5 mg twice daily.  Chronic anemia -Hemoglobin 11.1 and hematocrit 33.  At baseline.  Continue to monitor.  Hyperlipidemia -Continue Lipitor 40 mg daily.  BPH -Holding Proscar in the setting of hypotension  History of CVA -Continue Lipitor and currently on Eliquis for management of atrial fibrillation.  Hypothyroidism - Continue Synthyroid 25 mcg daily.  Non-insulin-dependent DM type II - Holding metformin and glipizide in the hospital setting - Continue heart  healthy/carb modified diet and low sliding scale SSI as needed coverage.  DVT prophylaxis:  Eliquis Code Status:  Full Code.  Verified with patient's daughter at the bedside. Diet: Heart healthy/carb modified Family Communication:   Family was present at bedside, at the time of interview. Opportunity was given to ask question and all questions were answered satisfactorily.  Disposition Plan: Tentative discharge to home with home health arrangement for home PT, home OT and home health nurse for pressure ulcer wound care. Consults: PT, OT and transition care team Admission status:   Observation, cardiac-Telemetry bed  Severity of Illness: The appropriate patient status for this patient is OBSERVATION. Observation status is judged to be reasonable and necessary in order to provide the required intensity of service to ensure the patient's safety. The patient's presenting symptoms, physical exam findings, and initial radiographic and laboratory data in the context of their medical condition is felt to place them at decreased risk for further clinical deterioration. Furthermore, it is anticipated that the patient will be medically stable for discharge from the hospital within 2 midnights of admission.     Tereasa Coop, MD [facility]  How to contact the [facility] Attending or Consulting provider 7A - 7P or covering provider during after hours 7P -7A, for this patient.  Check the care team in [facility] and look for a) attending/consulting TRH provider listed and b) the [facility] team listed Log into [email or web address] and use [facility] universal password to access. If you do not have the password, please contact the hospital operator. Locate the [facility] provider you are looking for under [facility] and page to a number that you can be directly reached. If you still have difficulty reaching the provider, please page the [facility] (Director on Call) for the Hospitalists listed on amion for  assistance.  11/15/2023, 12:56 AM

## 2023-11-14 NOTE — Discharge Instructions (Addendum)
Thank you for coming to [facility]. You were seen for fall. We did an exam, labs, and imaging, and these showed no acute findings. You can take 650mg  tylenol (acetaminophen) every 4-6 hours.  Please follow up with your primary care provider and/or neurologist within 1 week.   Do not hesitate to return to the ED or call 911 if you experience: -Worsening symptoms -Worsening mental status -Falls -Lightheadedness, passing out -Fevers/chills -Anything else that concerns you

## 2023-11-14 NOTE — ED Provider Notes (Signed)
I screened this patient on arrival, there is absolutely no signs of trauma to the head and neck or the body and he has no complaints.  The neck shift can take care of this patient, the trauma was downgraded to a "nontrauma"   Eber Hong, MD 11/14/23 1451

## 2023-11-14 NOTE — ED Triage Notes (Signed)
Pt BIB by EMS from home. A&Ox4. Pt walking around house and suddenly fell face first onto carpet. Denies pain except for upper lip pain. Denies N/V/D. Took meds this morning. Pt is on eliquis.  BP 124/60 80 HR 98% O2 298 CBG

## 2023-11-14 NOTE — Progress Notes (Signed)
   11/14/23 1452  Spiritual Encounters  Type of Visit Attempt (pt unavailable)  Conversation partners present during encounter Physician;Nurse  Reason for visit Trauma  OnCall Visit Yes   Responded to trauma 2, downgraded to non-trauma

## 2023-11-15 DIAGNOSIS — G9341 Metabolic encephalopathy: Secondary | ICD-10-CM | POA: Diagnosis not present

## 2023-11-15 LAB — COMPREHENSIVE METABOLIC PANEL
ALT: 30 U/L (ref 0–44)
AST: 33 U/L (ref 15–41)
Albumin: 2.7 g/dL — ABNORMAL LOW (ref 3.5–5.0)
Alkaline Phosphatase: 53 U/L (ref 38–126)
Anion gap: 6 (ref 5–15)
BUN: 13 mg/dL (ref 8–23)
CO2: 25 mmol/L (ref 22–32)
Calcium: 8.6 mg/dL — ABNORMAL LOW (ref 8.9–10.3)
Chloride: 108 mmol/L (ref 98–111)
Creatinine, Ser: 0.94 mg/dL (ref 0.61–1.24)
GFR, Estimated: >60 mL/min (ref >60)
Glucose, Bld: 183 mg/dL — ABNORMAL HIGH (ref 70–99)
Potassium: 4 mmol/L (ref 3.5–5.1)
Sodium: 139 mmol/L (ref 135–145)
Total Bilirubin: 0.8 mg/dL (ref <1.2)
Total Protein: 5 g/dL — ABNORMAL LOW (ref 6.5–8.1)

## 2023-11-15 LAB — TSH: TSH: 0.599 u[IU]/mL (ref 0.350–4.500)

## 2023-11-15 LAB — CBC
HCT: 30.1 % — ABNORMAL LOW (ref 39.0–52.0)
Hemoglobin: 10.1 g/dL — ABNORMAL LOW (ref 13.0–17.0)
MCH: 29.6 pg (ref 26.0–34.0)
MCHC: 33.6 g/dL (ref 30.0–36.0)
MCV: 88.3 fL (ref 80.0–100.0)
Platelets: 197 10*3/uL (ref 150–400)
RBC: 3.41 MIL/uL — ABNORMAL LOW (ref 4.22–5.81)
RDW: 14.5 % (ref 11.5–15.5)
WBC: 5.1 10*3/uL (ref 4.0–10.5)
nRBC: 0 % (ref 0.0–0.2)

## 2023-11-15 LAB — VITAMIN B12: Vitamin B-12: 778 pg/mL (ref 180–914)

## 2023-11-15 LAB — GLUCOSE, CAPILLARY
Glucose-Capillary: 116 mg/dL — ABNORMAL HIGH (ref 70–99)
Glucose-Capillary: 147 mg/dL — ABNORMAL HIGH (ref 70–99)
Glucose-Capillary: 157 mg/dL — ABNORMAL HIGH (ref 70–99)
Glucose-Capillary: 222 mg/dL — ABNORMAL HIGH (ref 70–99)
Glucose-Capillary: 222 mg/dL — ABNORMAL HIGH (ref 70–99)

## 2023-11-15 LAB — AMMONIA: Ammonia: 32 umol/L (ref 9–35)

## 2023-11-15 MED ORDER — BACLOFEN 25 MG/5ML PO SUSP
2.5000 mg | Freq: Two times a day (BID) | ORAL | Status: DC
  Administered 2023-11-15 – 2023-11-16 (×4): 2.5 mg via ORAL
  Filled 2023-11-15 (×6): qty 0.5

## 2023-11-15 MED ORDER — BACLOFEN 25 MG/5ML PO SUSP
2.5000 mg | Freq: Two times a day (BID) | ORAL | Status: DC
  Filled 2023-11-15: qty 0.5

## 2023-11-15 NOTE — TOC CM/SW Note (Signed)
Transition of Care [facility]) - Inpatient Brief Assessment   Patient Details  Name: Earl [name] MRN: [id number] Date of Birth: Nov 20, 1941  Transition of [facility] CM/SW Contact:    Gala Lewandowsky, RN Phone Number: 11/15/2023, 12:20 PM   Clinical Narrative: Patient presented for fall. PTA patient reports from home alone; however, he has support of daughter. Patient has PCS via [facility] 3 hours day T,TH, F. Patient is currently active with [facility]. Case Manager did call the Liaison and no answer. Daughter states the patient has submitted an appeal to [facility] for home health services- PT/RN. Case Manager unable to follow up today with [facility] agency. Unit Case Manager to follow.    Transition of Care Asessment: Insurance and Status: Insurance coverage has been reviewed Patient has primary care physician: Yes Home environment has been reviewed: reviewed- home alone- support of daughter. Has PCS via [facility] [employer]- Active with [facility]. Prior level of function:: weakness wounds Prior/Current Home Services: No current home services Social Drivers of Health Review: SDOH reviewed no interventions necessary Readmission risk has been reviewed: Yes Transition of care needs: transition of care needs identified, TOC will continue to follow

## 2023-11-15 NOTE — Evaluation (Signed)
Physical Therapy Evaluation Patient Details Name: Earl [name] MRN: [id number] DOB: 1941/08/24 Today's Date: 11/15/2023  History of Present Illness  82 y.o. male presents to [facility] hospital on 11/14/2023 after fall at home. PMH includes PAF, cardiac amyloidosis, CHF, CVA, DMII, MGUS, HTN, FTT.  Clinical Impression  Pt presents to PT with deficits in strength, power, endurance, gait, balance, functional mobility. Pt requires physical assistance with all transfers due to lack of LE power. Pt tends to lean to R side in sitting, which is his weaker side since prior CVA. Pt also is very kyphotic, with difficulty looking upward when conversing with PT throughout session. Pt is at a high risk for future falls due to generalized weakness on top of R sided deficits from CVA. Pt and pt's daughter would prefer discharge home with HHPT rather than pursuing inpatient placement. Pt;s daughter reports his RW and BSC are breaking down. PT recommends a new RW, BSC, and manual wheelchair. The wheelchair will be useful for community mobility, or to utilize within the home when caregivers are not present at night or during the day.        If plan is discharge home, recommend the following: A little help with walking and/or transfers;A little help with bathing/dressing/bathroom;Assistance with cooking/housework;Assist for transportation;Help with stairs or ramp for entrance   Can travel by private vehicle        Equipment Recommendations BSC/3in1;Rolling walker (2 wheels);Wheelchair (measurements PT)  Recommendations for Other Services       Functional Status Assessment Patient has had a recent decline in their functional status and demonstrates the ability to make significant improvements in function in a reasonable and predictable amount of time.     Precautions / Restrictions Precautions Precautions: Fall Restrictions Weight Bearing Restrictions Per Provider Order: No      Mobility  Bed  Mobility Overal bed mobility: Needs Assistance Bed Mobility: Supine to Sit, Sit to Supine     Supine to sit: Contact guard, HOB elevated, Used rails Sit to supine: Min assist, HOB elevated   General bed mobility comments: significant increase in time    Transfers Overall transfer level: Needs assistance Equipment used: Rolling walker (2 wheels) Transfers: Sit to/from Stand Sit to Stand: Min assist, From elevated surface           General transfer comment: PT provides significant cueing for hand placement and to increase trunk flexion and anterior lean in an effort to improve transfer efficiency    Ambulation/Gait Ambulation/Gait assistance: Contact guard assist Gait Distance (Feet): 25 Feet Assistive device: Rolling walker (2 wheels) Gait Pattern/deviations: Step-to pattern, Decreased dorsiflexion - right Gait velocity: reduced Gait velocity interpretation: <1.31 ft/sec, indicative of household ambulator   General Gait Details: pt with slowed step-to gait with reduced R foot dorsiflexion. pt compensates for this with increased hip flexion on R side  Stairs            Wheelchair Mobility     Tilt Bed    Modified Rankin (Stroke Patients Only)       Balance Overall balance assessment: Needs assistance Sitting-balance support: No upper extremity supported, Feet supported Sitting balance-Leahy Scale: Fair Sitting balance - Comments: tendency to lean to R side Postural control: Right lateral lean Standing balance support: Bilateral upper extremity supported Standing balance-Leahy Scale: Poor Standing balance comment: CGA with BUE support of RW  Pertinent Vitals/Pain Pain Assessment Pain Assessment: Faces Faces Pain Scale: Hurts little more Pain Location: L ankle wound Pain Descriptors / Indicators: Grimacing Pain Intervention(s): Monitored during session    Home Living Family/patient expects to be discharged to::  Private residence Living Arrangements: Alone Available Help at Discharge: Family;Home health;Available PRN/intermittently (4 hours at a time 3 a week; daughter with aportion of the day 2x a week and full days 2x a week) Type of Home: House Home Access: Stairs to enter Entrance Stairs-Rails: Right Entrance Stairs-Number of Steps: 3-4 (garage door)   Home Layout: One level;Other (Comment) (laregely one story with 1 step down into living room and 1 step up into kitchen) Home Equipment: Tub bench;Grab bars - tub/shower;Hand held shower head;BSC/3in1;Rolling Walker (2 wheels);Rollator (4 wheels) (non-skid strips in tub; BSC and RW are both rusted)      Prior Function Prior Level of Function : Independent/Modified Independent;Needs assist;History of Falls (last six months)             Mobility Comments: Pt typically uses a RW for funcitonal mobility in the home and a Rollator for appointments. Pt reports 2 falls in past 6 months and daughter reports pt often losses his balance and bumps into the walls. ADLs Comments: Pt has been completing Independent to Mod I but he and daughter report he likley needs CGA as he feels he is not able to be thourough with washing/drying feet and with peri-care. Pt receives assistance from daughter and son and a paid caregiver for IADLs. Daughter reports the family is in process of setting up assistance for shower sin the home. 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     Extremity/Trunk Assessment   Upper Extremity Assessment Upper Extremity Assessment: Defer to OT evaluation RUE Deficits / Details: generalized weakness; decreased sensation in thumb and first finger RUE Sensation: history of peripheral neuropathy (numbness in thumb and first finger) RUE Coordination: WNL LUE Deficits / Details: generalized weakness; decreased sensation in thumb and first finger LUE Sensation: history of peripheral neuropathy (numbness  in thumb and first finger) LUE Coordination: WNL    Lower Extremity Assessment Lower Extremity Assessment: RLE deficits/detail;LLE deficits/detail RLE Deficits / Details: pt with grossly 4-/5 strength in RLE, deficits present from prior CVA LLE Deficits / Details: grossly 4/5 strength, PROM WFL    Cervical / Trunk Assessment Cervical / Trunk Assessment: Normal  Communication   Communication Communication: Difficulty communicating thoughts/reduced clarity of speech (soft spoken)  Cognition Arousal: Alert Behavior During Therapy: WFL for tasks assessed/performed Overall Cognitive Status: Within Functional Limits for tasks assessed                                          General Comments General comments (skin integrity, edema, etc.): VSS on RA    Exercises     Assessment/Plan    PT Assessment Patient needs continued PT services  PT Problem List Decreased strength;Decreased activity tolerance;Decreased balance;Decreased mobility       PT Treatment Interventions DME instruction;Gait training;Stair training;Functional mobility training;Therapeutic activities;Therapeutic exercise;Balance training;Neuromuscular re-education;Patient/family education    PT Goals (Current goals can be found in the Care Plan section)  Acute Rehab PT Goals Patient Stated Goal: to reduce falls risk, ambulate independently within the home PT Goal Formulation: With patient Time For Goal Achievement: 11/29/23 Potential to Achieve Goals: Good  Frequency Min 1X/week     Co-evaluation               AM-PAC PT "6 Clicks" Mobility  Outcome Measure Help needed turning from your back to your side while in a flat bed without using bedrails?: A Little Help needed moving from lying on your back to sitting on the side of a flat bed without using bedrails?: A Little Help needed moving to and from a bed to a chair (including a wheelchair)?: A Little Help needed standing up from a  chair using your arms (e.g., wheelchair or bedside chair)?: A Little Help needed to walk in hospital room?: A Little Help needed climbing 3-5 steps with a railing? : A Lot 6 Click Score: 17    End of Session Equipment Utilized During Treatment: Gait belt Activity Tolerance: Patient tolerated treatment well Patient left: in bed;with call bell/phone within reach;with family/visitor present Nurse Communication: Mobility status PT Visit Diagnosis: Other abnormalities of gait and mobility (R26.89);Muscle weakness (generalized) (M62.81)    Time: 0454-0981 PT Time Calculation (min) (ACUTE ONLY): 42 min   Charges:   PT Evaluation $PT Eval Low Complexity: 1 Low PT Treatments $Gait Training: 8-22 mins PT General Charges $$ ACUTE PT VISIT: 1 Visit         Arlyss Gandy, PT, DPT Acute Rehabilitation Office [phone]   Arlyss Gandy 11/15/2023, 3:48 PM

## 2023-11-15 NOTE — Evaluation (Signed)
Occupational Therapy Evaluation Patient Details Name: Earl [name] MRN: [id number] DOB: 11-17-1941 Today's Date: 11/15/2023   History of Present Illness 82 y.o. male presents to [facility] hospital on 11/14/2023 after fall at home. PMH includes PAF, cardiac amyloidosis, CHF, CVA, DMII, MGUS, HTN, FTT.   Clinical Impression   At baseline, pt typically completes ADLs Independent to Mod I and functional transfers/mobility with a RW in the home and a Rollator in the community with Mod I. Pt reports 2 falls in past 6 months At baseline, pt receives assistance from family and a paid caregiver for IADLs. Pt now presents with decreased activity tolerance, decreased balance, generalized B UE weakness, and decreased safety and independence with functional tasks. Pt currently demonstrates ability to complete UB ADLs with Set up to Min assist, LB ADLs with Mod to Max assist, and functional mobility/transfers with a RW with Contact guard assist. Pt participated well in session and has a supportive family. Pt and family with strong preference for post acute home health rehab versus inpatient. Pt will benefit from acute skilled OT services to address deficits outlined below and to increase safety and independence with ADLs, functional transfers, and functional mobility. Post acute discharge, pt will benefit from continued skilled OT services in the home to maximize rehab potential. After completing home health rehab, pt may benefit from participation in PACE or similar community-based program.       If plan is discharge home, recommend the following: A lot of help with walking and/or transfers;A lot of help with bathing/dressing/bathroom;Assistance with cooking/housework;Assist for transportation;Help with stairs or ramp for entrance    Functional Status Assessment  Patient has had a recent decline in their functional status and demonstrates the ability to make significant improvements in function in a reasonable  and predictable amount of time.  Equipment Recommendations  BSC/3in1;Wheelchair (measurements OT);Wheelchair cushion (measurements OT);Other (comment) (RW)    Recommendations for Other Services       Precautions / Restrictions Precautions Precautions: Fall Restrictions Weight Bearing Restrictions Per Provider Order: No      Mobility Bed Mobility Overal bed mobility: Needs Assistance Bed Mobility: Rolling, Supine to Sit, Sit to Supine Rolling: Contact guard assist, Min assist   Supine to sit: Mod assist, HOB elevated, Used rails (with cues and increased time) Sit to supine: Max assist, HOB elevated (with cues and increased time)   General bed mobility comments: Mod assist to bring hips to EOB; Max assist to lift B LE into bed    Transfers Overall transfer level: Needs assistance Equipment used: Rolling walker (2 wheels) Transfers: Sit to/from Stand, Bed to chair/wheelchair/BSC Sit to Stand: From elevated surface, Min assist     Step pivot transfers: Mod assist, From elevated surface (with RW)     General transfer comment: with cues and increased time      Balance Overall balance assessment: Needs assistance, History of Falls Sitting-balance support: Single extremity supported, Bilateral upper extremity supported, Feet supported Sitting balance-Leahy Scale: Fair (Poor to Fair) Sitting balance - Comments: Pt initially requiring external support to maintain sitting balance progressing to static sitting with unilateral U E support and close Supervision for safety. Postural control: Right lateral lean (Daughters reports this has been noted over the past approx. 3 months) Standing balance support: Bilateral upper extremity supported, During functional activity, Reliant on assistive device for balance Standing balance-Leahy Scale: Poor  ADL either performed or assessed with clinical judgement   ADL Overall ADL's : Needs  assistance/impaired Eating/Feeding: Set up;Sitting   Grooming: Set up;Sitting   Upper Body Bathing: Minimal assistance;Sitting   Lower Body Bathing: Maximal assistance;Sit to/from stand;Sitting/lateral leans   Upper Body Dressing : Contact guard assist;Sitting   Lower Body Dressing: Moderate assistance;Maximal assistance;Sitting/lateral leans;Sit to/from stand   Toilet Transfer: Contact guard assist;Rolling walker (2 wheels);BSC/3in1 (step-pivot transfer) Toilet Transfer Details (indicate cue type and reason): simulated at EOB Toileting- Clothing Manipulation and Hygiene: Maximal assistance;Sit to/from stand;Sitting/lateral lean         General ADL Comments: Pt with decreased activity tolerance and fatiguing quickly during functional tasks.     Vision Baseline Vision/History: 1 Wears glasses (readers) Ability to See in Adequate Light: 0 Adequate Patient Visual Report: No change from baseline       Perception         Praxis         Pertinent Vitals/Pain Pain Assessment Pain Assessment: Faces Faces Pain Scale: Hurts little more Pain Location: L ankle at site of wound Pain Descriptors / Indicators: Aching, Grimacing, Guarding, Sore Pain Intervention(s): Limited activity within patient's tolerance, Monitored during session, Repositioned     Extremity/Trunk Assessment Upper Extremity Assessment Upper Extremity Assessment: Right hand dominant;Generalized weakness;RUE deficits/detail;LUE deficits/detail RUE Deficits / Details: generalized weakness; decreased sensation in thumb and first finger RUE Sensation: history of peripheral neuropathy (numbness in thumb and first finger) RUE Coordination: WNL LUE Deficits / Details: generalized weakness; decreased sensation in thumb and first finger LUE Sensation: history of peripheral neuropathy (numbness in thumb and first finger) LUE Coordination: WNL   Lower Extremity Assessment Lower Extremity Assessment: Defer to PT  evaluation RLE Deficits / Details: pt with grossly 4-/5 strength in RLE, deficits present from prior CVA LLE Deficits / Details: grossly 4/5 strength, PROM WFL   Cervical / Trunk Assessment Cervical / Trunk Assessment: Normal   Communication Communication Communication: Other (comment);Difficulty communicating thoughts/reduced clarity of speech (soft voice)   Cognition Arousal: Alert Behavior During Therapy: WFL for tasks assessed/performed Overall Cognitive Status: Within Functional Limits for tasks assessed                                 General Comments: Cognition largely WNL with pt AAOX4 and able to follow 2 and 3 step instructions consistently. Pt with increased time needed for processing and motor planning.     General Comments  VSS on RA throughout session. Pt reports no dizziness or lightheadedness during session. Pt's daughter present throughout session. RN present at beginning of session. PT present at end of session.    Exercises     Shoulder Instructions      Home Living Family/patient expects to be discharged to:: Private residence Living Arrangements: Alone Available Help at Discharge: Family;Home health;Available PRN/intermittently (4 hours at a time 3 a week; daughter with aportion of the day 2x a week and full days 2x a week) Type of Home: House Home Access: Stairs to enter [employer] of Steps: 3-4 (garage door) Entrance Stairs-Rails: Right Home Layout: One level;Other (Comment) (laregely one story with 1 step down into living room and 1 step up into kitchen)     Bathroom Shower/Tub: Chief Strategy Officer: Standard     Home Equipment: Tub bench;Grab bars - tub/shower;Hand held Armed forces logistics/support/administrative officer (2 wheels);Rollator (4 wheels) (non-skid strips in tub; BSC and RW are  both rusted)          Prior Functioning/Environment Prior Level of Function : Independent/Modified Independent;Needs assist;History of  Falls (last six months)             Mobility Comments: Pt typically uses a RW for funcitonal mobility in the home and a Rollator for appointments. Pt reports 2 falls in past 6 months and daughter reports pt often losses his balance and bumps into the walls. ADLs Comments: Pt has been completing Independent to Mod I but he and daughter report he likley needs CGA as he feels he is not able to be thourough with washing/drying feet and with peri-care. Pt receives assistance from daughter and son and a paid caregiver for IADLs. Daughter reports the family is in process of setting up assistance for shower sin the home. 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        OT Problem List: Decreased strength;Decreased activity tolerance;Impaired balance (sitting and/or standing);Decreased knowledge of use of DME or AE      OT Treatment/Interventions: Self-care/ADL training;Therapeutic exercise;Energy conservation;DME and/or AE instruction;Therapeutic activities;Patient/family education;Balance training    OT Goals(Current goals can be found in the care plan section) Acute Rehab OT Goals Patient Stated Goal: to return home OT Goal Formulation: With patient/family Time For Goal Achievement: 11/29/23 Potential to Achieve Goals: Good ADL Goals Pt Will Perform Grooming: standing;with modified independence Pt Will Perform Lower Body Bathing: with contact guard assist;sitting/lateral leans;sit to/from stand (with adaptive equipment as needed) Pt Will Perform Lower Body Dressing: with contact guard assist;sitting/lateral leans;sit to/from stand (with adaptive equipment as needed) Pt Will Transfer to Toilet: with modified independence;ambulating;regular height toilet (with least restrictive AD) Pt Will Perform Toileting - Clothing Manipulation and hygiene: sitting/lateral leans;sit to/from stand;with min assist Pt/caregiver will Perform Home Exercise Program:  Increased strength;Both right and left upper extremity;With theraband;With theraputty;With Supervision;With written HEP provided (Increased activity tolerance)  OT Frequency: Min 1X/week    Co-evaluation              AM-PAC OT "6 Clicks" Daily Activity     Outcome Measure                 End of Session Equipment Utilized During Treatment: Rolling walker (2 wheels);Gait belt Nurse Communication: Mobility status;Other (comment) (pt male Purewick leaking when pt is in standing)  Activity Tolerance: Patient tolerated treatment well Patient left: in bed;with call bell/phone within reach;with family/visitor present;Other (comment) (with PT present)  OT Visit Diagnosis: Unsteadiness on feet (R26.81);Other abnormalities of gait and mobility (R26.89);Muscle weakness (generalized) (M62.81);Repeated falls (R29.6);History of falling (Z91.81);Other (comment) (decreased activity tolerance)                Time: 5409-8119 OT Time Calculation (min): 54 min Charges:  OT General Charges $OT Visit: 1 Visit OT Evaluation $OT Eval Low Complexity: 1 Low OT Treatments $Self Care/Home Management : 23-37 mins  Karl Knarr "Orson Eva., OTR/L, MA Acute Rehab [phone]   Lendon Colonel 11/15/2023, 5:54 PM

## 2023-11-15 NOTE — Plan of Care (Signed)

## 2023-11-15 NOTE — Progress Notes (Signed)
PROGRESS NOTE    Earl [name]  ZOX:[id number] DOB: 05/02/1941 DOA: 11/14/2023 PCP: [name], MD    Brief Narrative:  Earl [name] is a 82 y.o. 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  Patient reported that he suddenly fell on his face onto a carpet.  Denies any injury, pain and loss of consciousness.   During my evaluation in the [facility] patient is currently alert oriented x 3.  However he reported generalized weakness and poor appetite.  Patient's daughter at the bedside reported that patient has muscle spasm and patient has been recently started on baclofen and also receiving subcu injection Amvutra every 3 months for the management of amyloidosis induced polyneuropathy.  Patient also has pressure ulcer of the buttock, bilateral lower extremities and left-sided heel.  Patient's daughter at the bedside reported that home health nurse visiting him only once a week which is not sufficient for taking care of his father.    Family requesting for home health nurse visit at least 2-3 times weekly for pressure ulcer care and requesting for home PT and OT management as well.   Assessment and Plan: Acute metabolic encephalopathy-improving -resolved     Failure to thrive in adult Generalized weakness Mechanical fall -PT/OT - Consulted transition care team for home health nurse arrangement for pressure ulcer wound care as well as home health for home PT and OT.      Pressure ulcer of buttock and left-sided heel -Patient has multiple pressure ulcer of his buttock and left-sided heel. - Consulted inpatient wound care for pressure ulcer management.  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  Currently on antitransthyretin injection every 3 months and baclofen 5 mg twice daily. -In the setting of recent fall and acute metabolic encephalopathy decreasing dose of baclofen to  2.5 mg twice daily.     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 -Additional finding.  Continue to monitor.   Chronic systolic heart failure reduced EF 40 to 45% History of cardiac amyloidosis -Patient's blood pressure is borderline  -At home patient is on Entresto, spironolactone and torsemide.  Will resume home blood pressure regimen gradually based blood pressure trend.   Paroxysmal atrial fibrillation -Continue Toprol-XL 50 mg daily.Heart rate in between 60-80. -Continue Eliquis 5 mg twice daily.   Chronic anemia - Continue to monitor.   Hyperlipidemia -Continue Lipitor 40 mg daily.   BPH -Holding Proscar in the setting of hypotension   History of CVA -Continue Lipitor and currently on Eliquis for management of atrial fibrillation.   Hypothyroidism - Continue Synthyroid 25 mcg daily.   Non-insulin-dependent DM type II - Holding metformin and glipizide in the hospital setting - Continue heart healthy/carb modified diet and low sliding scale SSI as needed coverage.  DVT prophylaxis: SCDs Start: 11/14/23 2325 Place TED hose Start: 11/14/23 2325 apixaban (ELIQUIS) tablet 5 mg    Code Status: Full Code Family Communication: at bedside  Disposition Plan:  Level of care: Telemetry Cardiac Status is: Observation The patient remains OBS  appropriate and will d/c before 2 midnights.    Consultants:  TOC   Subjective: Asking is he will go home today  Objective: Vitals:   11/14/23 2100 11/15/23 0104 11/15/23 0829 11/15/23 1129  BP: 99/66 106/63 101/68 109/60  Pulse: 72 79 96 70  Resp: 16 16 (!) 22 13  Temp:  98.4 F (36.9 C) 98.9 F (37.2 C) 98.7 F (37.1 C)  TempSrc:  Oral Oral Axillary  SpO2: 100% 100% 99% 99%  Weight:      Height:        Intake/Output Summary (Last 24 hours) at 11/15/2023 1203 Last data filed at 11/15/2023 1013 Gross per 24 hour  Intake 120 ml  Output 500 ml  Net -380 ml   Filed Weights   11/14/23 1451  Weight: 69.9 kg    Examination:   General: Appearance:    elderly male in no acute distress     Lungs:    , respirations unlabored  Heart:    Normal heart rate.     MS:   All extremities are intact.    Neurologic:   Awake, alert       Data Reviewed: I have personally reviewed following labs and imaging studies  CBC: Recent Labs  Lab 11/14/23 1635 11/14/23 1750 11/15/23 0307  WBC 7.1  --  5.1  NEUTROABS 5.2  --   --   HGB 11.1* 8.8* 10.1*  HCT 33.1* 26.0* 30.1*  MCV 89.2  --  88.3  PLT 207  --  197   Basic Metabolic Panel: Recent Labs  Lab 11/14/23 1635 11/14/23 1750 11/15/23 0307  NA 142 144 139  K 4.1 3.6 4.0  CL 108  --  108  CO2 25  --  25  GLUCOSE 203*  --  183*  BUN 20  --  13  CREATININE 0.98  --  0.94  CALCIUM 9.1  --  8.6*   GFR: Estimated Creatinine Clearance: 59.9 mL/min (by C-G formula based on SCr of 0.94 mg/dL). Liver Function Tests: Recent Labs  Lab 11/14/23 1635 11/15/23 0307  AST 40 33  ALT 33 30  ALKPHOS 59 53  BILITOT 0.5 0.8  PROT 5.6* 5.0*  ALBUMIN 3.1* 2.7*   No results for input(s): "LIPASE", "AMYLASE" in the last 168 hours. Recent Labs  Lab 11/15/23 0307  AMMONIA 32   Coagulation Profile: No results for input(s): "INR", "PROTIME" in the last 168 hours. Cardiac Enzymes: No results for input(s): "CKTOTAL",  "CKMB", "CKMBINDEX", "TROPONINI" in the last 168 hours. BNP (last 3 results) No results for input(s): "PROBNP" in the last 8760 hours. HbA1C: No results for input(s): "HGBA1C" in the last 72 hours. CBG: Recent Labs  Lab 11/14/23 1607 11/14/23 2236 11/15/23 0128 11/15/23 0555 11/15/23 1129  GLUCAP 167* 121* 116* 147* 157*   Lipid Profile: No results for input(s): "CHOL", "HDL", "LDLCALC", "TRIG", "CHOLHDL", "LDLDIRECT" in the last 72 hours. Thyroid Function Tests: Recent Labs    11/15/23 0307  TSH 0.599   Anemia Panel: Recent Labs    11/15/23 0307  VITAMINB12 778   Sepsis Labs: No results for input(s): "PROCALCITON", "LATICACIDVEN" in the last 168 hours.  No results found for this or any previous visit (from the past 240 hours).       Radiology Studies: CT Head Wo Contrast Result Date: 11/14/2023 CLINICAL DATA:  Provided history: Head trauma, minor. Neck trauma. Additional history obtained from  electronic MEDICAL RECORD NUMBERFall (face first), upper lip pain. EXAM: CT HEAD WITHOUT CONTRAST CT CERVICAL SPINE WITHOUT CONTRAST TECHNIQUE: Multidetector CT imaging of the head and cervical spine was performed following the standard protocol without intravenous contrast. Multiplanar CT image reconstructions of the cervical spine were also generated. RADIATION DOSE REDUCTION: This exam was performed according to the departmental dose-optimization program which includes automated exposure control, adjustment of the mA and/or kV according to patient size and/or use of iterative reconstruction technique. COMPARISON:  Head CT 10/11/2023. Cervical spine MRI 09/03/2022. CTA head/neck 07/26/2017. FINDINGS: CT HEAD FINDINGS Brain: Generalized cerebral and cerebellar atrophy. Patchy and ill-defined hypoattenuation within the cerebral white matter, nonspecific but compatible with mild chronic small vessel ischemic disease. Known chronic infarct within the superior right cerebellar hemisphere (SCA  vascular territory). There is no acute intracranial hemorrhage. No demarcated cortical infarct. No extra-axial fluid collection. No evidence of an intracranial mass. No midline shift. Vascular: No hyperdense vessel. Atherosclerotic calcifications. Skull: No calvarial fracture or aggressive osseous lesion. Sinuses/Orbits: No mass or acute finding within the imaged orbits. Minimal mucosal thickening within the bilateral ethmoid and left frontal sinuses. CT CERVICAL SPINE FINDINGS Alignment: 2-3 mm grade 1 retrolisthesis at C4-C5, C5-C6 and C6-C7. Skull base and vertebrae: The basion-dental and atlanto-dental intervals are maintained.No evidence of acute fracture to the cervical spine. Soft tissues and spinal canal: No prevertebral fluid or swelling. No visible canal hematoma. Disc levels: Cervical spondylosis with multilevel disc space narrowing, disc bulges/central disc protrusions, posterior disc osteophyte complexes, uncovertebral hypertrophy and facet arthrosis. Disc space narrowing is greatest at C3-C4 (moderate to advanced) and C6-C7 (advanced). No appreciable high-grade spinal canal stenosis. Multilevel bony neural foraminal narrowing. Upper chest: No consolidation within the imaged lung apices. No visible pneumothorax. 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 Partially imaged, known left thyroid lobe nodule. By report, this nodule has been previously biopsied. Correlate with the pathology result from this procedure. IMPRESSION: CT head: 1.  No evidence of an acute intracranial abnormality. 2. Parenchymal atrophy, chronic small vessel ischemic disease and chronic right cerebellar infarct as described. CT cervical spine: 1. No evidence of an acute cervical spine fracture. 2. Grade 1 retrolisthesis at C4-C5, C5-C6 and C6-C7. 3. Cervical spondylosis as described. 4. Large partially imaged lipoma within the ventral mid-to-lower neck, measuring at  least 6.0 x 2.5 x 5.9 cm. Electronically Signed   By: Jackey Loge D.O.   On: 11/14/2023 16:53   CT Cervical Spine Wo Contrast Result Date: 11/14/2023 CLINICAL DATA:  Provided history: Head trauma, minor. Neck trauma. Additional history obtained from electronic MEDICAL RECORD NUMBERFall (face first), upper lip pain. EXAM: CT HEAD WITHOUT CONTRAST CT CERVICAL SPINE WITHOUT CONTRAST TECHNIQUE: Multidetector CT imaging of the head and cervical spine was performed following the standard protocol without intravenous contrast. Multiplanar CT image reconstructions of the cervical spine were also generated. RADIATION DOSE REDUCTION: This exam was performed according to the departmental dose-optimization program which includes automated exposure control, adjustment of the mA and/or kV according to patient size and/or use of iterative reconstruction technique. COMPARISON:  Head CT 10/11/2023. Cervical spine MRI 09/03/2022. CTA head/neck 07/26/2017. FINDINGS: CT HEAD FINDINGS Brain: Generalized cerebral and cerebellar atrophy. Patchy and ill-defined hypoattenuation within the cerebral white matter, nonspecific but compatible with mild chronic small vessel ischemic disease. Known chronic infarct within the superior right cerebellar hemisphere (SCA vascular territory). There is no acute intracranial hemorrhage. No demarcated cortical infarct. No  extra-axial fluid collection. No evidence of an intracranial mass. No midline shift. Vascular: No hyperdense vessel. Atherosclerotic calcifications. Skull: No calvarial fracture or aggressive osseous lesion. Sinuses/Orbits: No mass or acute finding within the imaged orbits. Minimal mucosal thickening within the bilateral ethmoid and left frontal sinuses. CT CERVICAL SPINE FINDINGS Alignment: 2-3 mm grade 1 retrolisthesis at C4-C5, C5-C6 and C6-C7. Skull base and vertebrae: The basion-dental and atlanto-dental intervals are maintained.No evidence of acute fracture to the cervical spine.  Soft tissues and spinal canal: No prevertebral fluid or swelling. No visible canal hematoma. Disc levels: Cervical spondylosis with multilevel disc space narrowing, disc bulges/central disc protrusions, posterior disc osteophyte complexes, uncovertebral hypertrophy and facet arthrosis. Disc space narrowing is greatest at C3-C4 (moderate to advanced) and C6-C7 (advanced). No appreciable high-grade spinal canal stenosis. Multilevel bony neural foraminal narrowing. Upper chest: No consolidation within the imaged lung apices. No visible pneumothorax. 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 Partially imaged, known left thyroid lobe nodule. By report, this nodule has been previously biopsied. Correlate with the pathology result from this procedure. IMPRESSION: CT head: 1.  No evidence of an acute intracranial abnormality. 2. Parenchymal atrophy, chronic small vessel ischemic disease and chronic right cerebellar infarct as described. CT cervical spine: 1. No evidence of an acute cervical spine fracture. 2. Grade 1 retrolisthesis at C4-C5, C5-C6 and C6-C7. 3. Cervical spondylosis as described. 4. 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 Electronically Signed   By: Jackey Loge D.O.   On: 11/14/2023 16:53        Scheduled Meds:  apixaban  5 mg Oral BID   atorvastatin  40 mg Oral Daily   baclofen  2.5 mg Oral BID   ferrous sulfate  325 mg Oral QHS   insulin aspart  0-5 Units Subcutaneous QHS   insulin aspart  0-6 Units Subcutaneous TID WC   levothyroxine  25 mcg Oral Q0600   metoprolol succinate  50 mg Oral QHS   sodium chloride flush  3 mL Intravenous Q12H   sodium chloride flush  3 mL Intravenous Q12H   Continuous Infusions:  sodium chloride       LOS: 0 days    Time spent: 35 minutes spent on chart review, discussion with nursing staff, consultants, updating family  and interview/physical exam; more than 50% of that time was spent in counseling and/or coordination of care.    Joseph Art, DO [facility] Available via Epic secure chat 7am-7pm After these hours, please refer to coverage provider listed on [email or web address] 11/15/2023, 12:03 PM

## 2023-11-15 NOTE — Care Management Obs Status (Signed)
MEDICARE OBSERVATION STATUS NOTIFICATION   Patient Details  Name: Earl [name] MRN: [id number] Date of Birth: 01-05-1941   Medicare Observation Status Notification Given:  Yes    Gala Lewandowsky, RN 11/15/2023, 12:16 PM

## 2023-11-15 NOTE — Progress Notes (Signed)
Pt requested TED  hose to be taken off. Stated that the socks made him uncomfortable.   Lawson Radar, RN

## 2023-11-16 ENCOUNTER — Other Ambulatory Visit (HOSPITAL_COMMUNITY): Payer: Self-pay

## 2023-11-16 DIAGNOSIS — G9341 Metabolic encephalopathy: Secondary | ICD-10-CM | POA: Diagnosis not present

## 2023-11-16 LAB — GLUCOSE, CAPILLARY
Glucose-Capillary: 127 mg/dL — ABNORMAL HIGH (ref 70–99)
Glucose-Capillary: 182 mg/dL — ABNORMAL HIGH (ref 70–99)
Glucose-Capillary: 180 mg/dL — ABNORMAL HIGH (ref 70–99)

## 2023-11-16 MED ORDER — MUSCLE RUB 10-15 % EX CREA
TOPICAL_CREAM | CUTANEOUS | Status: DC | PRN
  Filled 2023-11-16: qty 85

## 2023-11-16 MED ORDER — BACLOFEN 5 MG PO TABS: 2.5000 mg | ORAL_TABLET | Freq: Two times a day (BID) | ORAL | Status: DC

## 2023-11-16 NOTE — Telephone Encounter (Signed)
[employer] requested additional information for prior authorization. Info provided by phone on 11/14/23.  Prior authorization for Earl [name] has been approved. Test billing returns refill too soon rejection. Unable to confirm copay at this time.  Key: [id number] Effective: 11/14/2023 to 11/20/2024

## 2023-11-16 NOTE — Progress Notes (Signed)
Patient discharged to home. Reviewed AVS with patient and his daughter, all questions answered. IV removed, site clean dry and intact. Daughter will provide transportation once DME is delivered.

## 2023-11-16 NOTE — Discharge Summary (Signed)
Physician Discharge Summary  Earl [name] ZOX:[id number] DOB: [birth year] DOA: 11/14/2023  PCP: [name], MD  Admit date: 11/14/2023 Discharge date: 11/16/2023  Admitted From: home Discharge disposition: home   Recommendations for Outpatient Follow-Up:   Increased home health- RN/PT   Discharge Diagnosis:   Principal Problem:   Acute metabolic encephalopathy Active Problems:   Paroxysmal atrial fibrillation (HCC)   Failure to thrive in adult   Generalized weakness   Fall at home, initial encounter   Pressure ulcer   Muscle spasms of both lower extremities   MGUS (monoclonal gammopathy of unknown significance)   Chronic anemia   Hyperlipidemia   Non-insulin dependent type 2 diabetes mellitus (HCC)   BPH (benign prostatic hyperplasia)   Hypothyroidism   History of CVA (cerebrovascular accident)   Chronic systolic CHF (congestive heart failure) (HCC)    Discharge Condition: Improved.  Diet recommendation: Low sodium, heart healthy.  Carbohydrate-modified.   Code status: Full.   History of Present Illness:   Earl [name] is a 82 y.o. 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  Patient reported that he suddenly fell on his face onto a carpet.  Denies any injury, pain and loss of consciousness.   During my evaluation in the [facility] patient is currently alert oriented x 3.  However he reported generalized weakness and poor appetite.   Patient's daughter at the bedside reported that patient has muscle spasm and patient has been recently started on baclofen and also receiving subcu injection Amvutra every 3 months for the management of amyloidosis induced polyneuropathy.  Patient also has pressure ulcer of the buttock, bilateral lower extremities and left-sided heel.  Patient's daughter at the bedside reported that home health nurse visiting him only once a week which is not sufficient for taking care of his father.    Family requesting for home health nurse visit at least 2-3 times weekly for pressure ulcer care and requesting for home PT and OT management as well.   Hospital Course by Problem:   Acute metabolic encephalopathy-improving -resolved with lower dose of baclofen     Failure to thrive in adult Generalized weakness Mechanical fall -PT/OT - Consulted transition care team for home health nurse arrangement for pressure ulcer wound care as well as home health for home PT and OT.     Pressure ulcer of buttock and left-sided heel -Patient has multiple pressure ulcer of his buttock and left-sided heel. 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  Currently on antitransthyretin injection every 3 months and baclofen 5 mg twice daily. -In the setting of recent fall and acute metabolic encephalopathy decreasing dose of baclofen to  2.5 mg twice daily. -monitor fluid status as dehydration could contribute     Lipoma of the neck - CT neck showed Large partially imaged lipoma within the ventral mid-to-lower neck, measuring at least 6.0 x 2.5 x 5.9 cm. -Additional finding.  Continue to monitor.   Chronic systolic heart failure reduced EF 40 to  45% History of cardiac amyloidosis -Patient's blood pressure is borderline  -At home patient is on Entresto,  spironolactone and torsemide.  Will resume home blood pressure regimen gradually based blood pressure trend.   Paroxysmal atrial fibrillation -Continue Toprol-XL 50 mg daily.Heart rate in between 60-80. -Continue Eliquis 5 mg twice daily.   Chronic anemia - Continue to monitor.   Hyperlipidemia -Continue Lipitor 40 mg daily.   BPH -Holding Proscar in the setting of hypotension   History of CVA -Continue Lipitor and currently on Eliquis for management of atrial fibrillation.   Hypothyroidism - Continue Synthyroid 25 mcg daily.   Non-insulin-dependent DM type II - resume home meds    Medical Consultants:      Discharge Exam:   Vitals:   11/16/23 0744 11/16/23 1201  BP: 119/79 110/62  Pulse: 70 84  Resp: 17 20  Temp: 98.2 F (36.8 C) 98.9 F (37.2 C)  SpO2:     Vitals:   11/15/23 2252 11/16/23 0402 11/16/23 0744 11/16/23 1201  BP: (!) 121/58 119/66 119/79 110/62  Pulse:   70 84  Resp: 17 14 17 20   Temp: 98.7 F (37.1 C) 98.1 F (36.7 C) 98.2 F (36.8 C) 98.9 F (37.2 C)  TempSrc: Oral Oral Oral Oral  SpO2:      Weight:  68.7 kg    Height:        General exam: Appears calm and comfortable.   The results of significant diagnostics from this hospitalization (including imaging, microbiology, ancillary and laboratory) are listed below for reference.     Procedures and Diagnostic Studies:   CT Head Wo Contrast Result Date: 11/14/2023 CLINICAL DATA:  Provided history: Head trauma, minor. Neck trauma. Additional history obtained from electronic MEDICAL RECORD NUMBERFall (face first), upper lip pain. EXAM: CT HEAD WITHOUT CONTRAST CT CERVICAL SPINE WITHOUT CONTRAST TECHNIQUE: Multidetector CT imaging of the head and cervical spine was performed following the standard protocol without intravenous contrast. Multiplanar CT image reconstructions of the cervical spine were also generated. RADIATION DOSE REDUCTION: This exam was performed according to the departmental  dose-optimization program which includes automated exposure control, adjustment of the mA and/or kV according to patient size and/or use of iterative reconstruction technique. COMPARISON:  Head CT 10/11/2023. Cervical spine MRI 09/03/2022. CTA head/neck 07/26/2017. FINDINGS: CT HEAD FINDINGS Brain: Generalized cerebral and cerebellar atrophy. Patchy and ill-defined hypoattenuation within the cerebral white matter, nonspecific but compatible with mild chronic small vessel ischemic disease. Known chronic infarct within the superior right cerebellar hemisphere (SCA vascular territory). There is no acute intracranial hemorrhage. No demarcated cortical infarct. No extra-axial fluid collection. No evidence of an intracranial mass. No midline shift. Vascular: No hyperdense vessel. Atherosclerotic calcifications. Skull: No calvarial fracture or aggressive osseous lesion. Sinuses/Orbits: No mass or acute finding within the imaged orbits. Minimal mucosal thickening within the bilateral ethmoid and left frontal sinuses. CT CERVICAL SPINE FINDINGS Alignment: 2-3 mm grade 1 retrolisthesis at C4-C5, C5-C6 and C6-C7. Skull base and vertebrae: The basion-dental and atlanto-dental intervals are maintained.No evidence of acute fracture to the cervical spine. Soft tissues and spinal canal: No prevertebral fluid or swelling. No visible canal hematoma. Disc levels: Cervical spondylosis with multilevel disc space narrowing, disc bulges/central disc protrusions, posterior disc osteophyte complexes, uncovertebral hypertrophy and facet arthrosis. Disc space narrowing is greatest at C3-C4 (moderate to advanced) and C6-C7 (advanced). No appreciable high-grade spinal canal stenosis. Multilevel bony neural foraminal narrowing. Upper chest: No consolidation within the imaged lung apices. No visible pneumothorax. Other: Partially  imaged lipoma within the ventral mid-to-lower neck, measuring at least 6.0 x 2.5 x 5.9 cm. Partially imaged, known  left thyroid lobe nodule. By report, this nodule has been previously biopsied. Correlate with the pathology result from this procedure. IMPRESSION: CT head: 1.  No evidence of an acute intracranial abnormality. 2. Parenchymal atrophy, chronic small vessel ischemic disease and chronic right cerebellar infarct as described. CT cervical spine: 1. No evidence of an acute cervical spine fracture. 2. Grade 1 retrolisthesis at C4-C5, C5-C6 and C6-C7. 3. Cervical spondylosis as described. 4. 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 Electronically Signed   By: Jackey Loge D.O.   On: 11/14/2023 16:53   CT Cervical Spine Wo Contrast Result Date: 11/14/2023 CLINICAL DATA:  Provided history: Head trauma, minor. Neck trauma. Additional history obtained from electronic MEDICAL RECORD NUMBERFall (face first), upper lip pain. EXAM: CT HEAD WITHOUT CONTRAST CT CERVICAL SPINE WITHOUT CONTRAST TECHNIQUE: Multidetector CT imaging of the head and cervical spine was performed following the standard protocol without intravenous contrast. Multiplanar CT image reconstructions of the cervical spine were also generated. RADIATION DOSE REDUCTION: This exam was performed according to the departmental dose-optimization program which includes automated exposure control, adjustment of the mA and/or kV according to patient size and/or use of iterative reconstruction technique. COMPARISON:  Head CT 10/11/2023. Cervical spine MRI 09/03/2022. CTA head/neck 07/26/2017. FINDINGS: CT HEAD FINDINGS Brain: Generalized cerebral and cerebellar atrophy. Patchy and ill-defined hypoattenuation within the cerebral white matter, nonspecific but compatible with mild chronic small vessel ischemic disease. Known chronic infarct within the superior right cerebellar hemisphere (SCA vascular territory). There is no acute intracranial hemorrhage. No demarcated cortical infarct. No extra-axial fluid  collection. No evidence of an intracranial mass. No midline shift. Vascular: No hyperdense vessel. Atherosclerotic calcifications. Skull: No calvarial fracture or aggressive osseous lesion. Sinuses/Orbits: No mass or acute finding within the imaged orbits. Minimal mucosal thickening within the bilateral ethmoid and left frontal sinuses. CT CERVICAL SPINE FINDINGS Alignment: 2-3 mm grade 1 retrolisthesis at C4-C5, C5-C6 and C6-C7. Skull base and vertebrae: The basion-dental and atlanto-dental intervals are maintained.No evidence of acute fracture to the cervical spine. Soft tissues and spinal canal: No prevertebral fluid or swelling. No visible canal hematoma. Disc levels: Cervical spondylosis with multilevel disc space narrowing, disc bulges/central disc protrusions, posterior disc osteophyte complexes, uncovertebral hypertrophy and facet arthrosis. Disc space narrowing is greatest at C3-C4 (moderate to advanced) and C6-C7 (advanced). No appreciable high-grade spinal canal stenosis. Multilevel bony neural foraminal narrowing. Upper chest: No consolidation within the imaged lung apices. No visible pneumothorax. 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 Partially imaged, known left thyroid lobe nodule. By report, this nodule has been previously biopsied. Correlate with the pathology result from this procedure. IMPRESSION: CT head: 1.  No evidence of an acute intracranial abnormality. 2. Parenchymal atrophy, chronic small vessel ischemic disease and chronic right cerebellar infarct as described. CT cervical spine: 1. No evidence of an acute cervical spine fracture. 2. Grade 1 retrolisthesis at C4-C5, C5-C6 and C6-C7. 3. Cervical spondylosis as described. 4. 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 Electronically Signed   By: Jackey Loge D.O.   On: 11/14/2023 16:53     Labs:   Basic  Metabolic Panel: Recent Labs  Lab 11/14/23 1635 11/14/23 1750 11/15/23 1610  NA 142 144 139  K 4.1 3.6 4.0  CL 108  --  108  CO2 25  --  25  GLUCOSE 203*  --  183*  BUN 20  --  13  CREATININE 0.98  --  0.94  CALCIUM 9.1  --  8.6*   GFR Estimated Creatinine Clearance: 58.9 mL/min (by C-G formula based on SCr of 0.94 mg/dL). Liver Function Tests: Recent Labs  Lab 11/14/23 1635 11/15/23 0307  AST 40 33  ALT 33 30  ALKPHOS 59 53  BILITOT 0.5 0.8  PROT 5.6* 5.0*  ALBUMIN 3.1* 2.7*   No results for input(s): "LIPASE", "AMYLASE" in the last 168 hours. Recent Labs  Lab 11/15/23 0307  AMMONIA 32   Coagulation profile No results for input(s): "INR", "PROTIME" in the last 168 hours.  CBC: Recent Labs  Lab 11/14/23 1635 11/14/23 1750 11/15/23 0307  WBC 7.1  --  5.1  NEUTROABS 5.2  --   --   HGB 11.1* 8.8* 10.1*  HCT 33.1* 26.0* 30.1*  MCV 89.2  --  88.3  PLT 207  --  197   Cardiac Enzymes: No results for input(s): "CKTOTAL", "CKMB", "CKMBINDEX", "TROPONINI" in the last 168 hours. BNP: Invalid input(s): "POCBNP" CBG: Recent Labs  Lab 11/15/23 1129 11/15/23 1603 11/15/23 2056 11/16/23 0607 11/16/23 1203  GLUCAP 157* 222* 222* 127* 182*   D-Dimer No results for input(s): "DDIMER" in the last 72 hours. Hgb A1c No results for input(s): "HGBA1C" in the last 72 hours. Lipid Profile No results for input(s): "CHOL", "HDL", "LDLCALC", "TRIG", "CHOLHDL", "LDLDIRECT" in the last 72 hours. Thyroid function studies Recent Labs    11/15/23 0307  TSH 0.599   Anemia work up Recent Labs    11/15/23 0307  [id number]   Microbiology No results found for this or any previous visit (from the past 240 hours).   Discharge Instructions:   Discharge Instructions     Diet - low sodium heart healthy   Complete by: As directed    Diet Carb Modified   Complete by: As directed    Discharge wound care:   Complete by: As directed    Left heel: Apply Xeroform  (change daily) on the wound bed. Cover with foam dressing, change 3Q.   Increase activity slowly   Complete by: As directed       Allergies as of 11/16/2023       Reactions   Penicillins Other (See Comments)   Did it involve swelling of the face/tongue/throat, SOB, or low BP? Unknown Did it involve sudden or severe rash/hives, skin peeling, or any reaction on the inside of your mouth or nose? Unknown Did you need to seek medical attention at a hospital or doctor's office? No When did it last happen?    Over 56 Years Ago   If all above answers are "NO", may proceed with cephalosporin use.        Medication List     TAKE these medications    Amvuttra 25 MG/0.5ML syringe Generic drug: vutrisiran sodium INJECT 25MG  (0.5ML) SUBCUTANEOUSLY ONCE EVERY 3 MONTHS. ROTATE ADMINISTRATION SITE WITH EACH INJECTION. *REFRIGERATE. DO NOT SHAKE.*   Aquaphor Adv Protect Healing Oint Apply small amount to two areas on buttocks daily to prevent breakdown   ascorbic acid 500 MG tablet Commonly known as: VITAMIN C Take 500 mg by mouth daily.   atorvastatin 40 MG tablet Commonly known as: LIPITOR Take  1 tablet  Daily for Cholesterol                                                       /  TAKE                                                       BY                                           MOUTH   B-12 50 MCG Tabs Take 50 mcg by mouth daily.   Baclofen 5 MG Tabs Take 0.5 tablets (2.5 mg total) by mouth 2 (two) times daily. What changed: how much to take   clotrimazole-betamethasone cream Commonly known as: Lotrisone Apply to affected area 2 to 4 x / day   Eliquis 5 MG Tabs tablet Generic drug: apixaban TAKE 1 TABLET BY MOUTH TWICE  DAILY TO PREVENT BLOOD CLOTS   Entresto 24-26 MG Generic drug: sacubitril-valsartan Take 1 tablet by mouth 2 (two) times daily.   ferrous sulfate 325 (65 FE) MG tablet Take 325 mg by mouth at bedtime.   finasteride 5  MG tablet Commonly known as: PROSCAR TAKE 1 TABLET BY MOUTH DAILY FOR PROSTATE   FreeStyle Libre 3 Reader [employer] Use with freestyle libre sensor   [employer] 3 Sensor Misc Place 1 sensor on the skin every 14 days. Use to check glucose continuously   glipiZIDE 5 MG tablet Commonly known as: GLUCOTROL Take 1/2 to 1 tablet 3 x / day with Meals for Diabetes What changed:  how much to take how to take this when to take this additional instructions   Klor-Con M20 20 MEQ tablet Generic drug: potassium chloride SA Take 1 tablet by mouth once daily   levothyroxine 50 MCG tablet Commonly known as: SYNTHROID TAKE 1 TABLET BY MOUTH DAILY ON  AN EMPTY STOMACH WITH ONLY WATER FOR 30 MINUTES AND NO ANTACID,  MEDS, CALCIUM OR MAGNESIUM FOR 4 HOURS AND AVOID BIOTIN What changed:  how much to take how to take this when to take this additional instructions   Magnesium 250 MG Tabs Take 250 mg by mouth daily.   metFORMIN 500 MG 24 hr tablet Commonly known as: GLUCOPHAGE-XR TAKE 2 TABLETS BY MOUTH TWICE  DAILY WITH MEALS FOR DIABETES   metoprolol succinate 50 MG 24 hr tablet Commonly known as: TOPROL-XL TAKE 1 TABLET BY MOUTH AT  BEDTIME   multivitamin capsule Take 1 capsule by mouth daily.   NONFORMULARY OR COMPOUNDED ITEM Please dispense a Rolling Walker.  Dx:R26.81 & G63   ONE TOUCH SURESOFT Misc Check blood sugar 1 time a day-DX-E11.91   ONE TOUCH ULTRA 2 w/Device Kit Check blood sugar 1 time a day-DX-E11.9   OneTouch Delica Plus Lancet30G Misc CHECK BLOOD SUGAR ONCE A DAY.   OneTouch Ultra test strip Generic drug: glucose blood Use as instructed   spironolactone 25 MG tablet Commonly known as: ALDACTONE Take 1 tablet (25 mg total) by mouth at bedtime.   torsemide 20 MG tablet Commonly known as: DEMADEX Take  1 tablet   2 x /day  for Fluid Retention & Leg / Ankle Swelling   triamcinolone cream 0.1 % Commonly known as: KENALOG Apply 1 Application topically as  needed.   trolamine salicylate 10 % cream Commonly known as: ASPERCREME Apply 1 application topically daily as needed for muscle pain.  VITAMIN A PO Take by mouth. 3000 mg   VITAMIN D PO Take 1 tablet by mouth daily.   Vyndamax 61 MG Caps Generic drug: Tafamidis Take 1 capsule by mouth daily.               Durable Medical Equipment  (From admission, onward)           Start     Ordered   11/16/23 1244  For home use only DME Bedside commode  Once       Comments: Needs 3:1  Question:  Patient needs a bedside commode to treat with the following condition  Answer:  CVA (cerebral vascular accident) (HCC)   11/16/23 1244   11/16/23 1243  For home use only DME lightweight manual wheelchair with seat cushion  Once       Comments: Patient suffers from CVA, CHF and generalized weakness which impairs their ability to perform daily activities like toileting in the home.  A walker will not resolve  issue with performing activities of daily living. A wheelchair will allow patient to safely perform daily activities. Patient is not able to propel themselves in the home using a standard weight wheelchair due to general weakness. Patient can self propel in the lightweight wheelchair. Length of need LIfetime. Accessories: elevating leg rests (ELRs), wheel locks, extensions and anti-tippers.   11/16/23 1244              Discharge Care Instructions  (From admission, onward)           Start     Ordered   11/16/23 0000  Discharge wound care:       Comments: Left heel: Apply Xeroform (change daily) on the wound bed. Cover with foam dressing, change 3Q.   11/16/23 1316            Follow-up Information     [name], MD. Schedule an appointment as soon as possible for a visit in 1 week.   Specialty: Internal Medicine Why: For follow-up Contact information: [address] [place] [address] [phone]         [facility] Emergency  Department at [facility]. Go to .   Specialty: Emergency Medicine Why: As needed, If symptoms worsen Contact information: [address] [place] [address] [phone]        [name], DO. Schedule an appointment as soon as possible for a visit in 1 week.   Specialty: Neurology Why: For follow-up Contact information: [address] [place] [address] [phone]         [facility] Follow up.   Why: [employer] will provide a wheelchair and 3:1 to the hospital room before you are discharged home. Contact information: [address] [place] [address] [phone]                  Time coordinating discharge:  Signed:  Joseph Art DO  [facility] 11/16/2023, 1:19 PM

## 2023-11-16 NOTE — TOC Transition Note (Addendum)
Transition of Care [facility]) - Discharge Note   Patient Details  Name: Earl [name] MRN: [id number] Date of Birth: 1941/09/05  Transition of [facility] CM/SW Contact:  [name], RN Phone Number: 11/16/2023, 1:52 PM   Clinical Narrative:    CM called and spoke with Bjorn Loser, CM with [facility] and patient is currently active with home health services prior to admission - but has likely only approved a few more visits.  Patient's re-admission to the hospital may likely provide continued services per [facility].  I ordered [facility] RN, PT, OT and discussed this with the daughter at the bedside.  MD will co-sign with hopes that home health will be approved through the Medicare advantage plan.  Patient needs WC and 3:1 at home.  I provided Medicare choice to the daughter and she did not have a preference.  I ordered both WC and 3:1 to be delivered to the bedside by [employer] today prior to patient's discharge to home.  No other TOC needs and patient should discharge home today with the daughter.  11/16/23 1657 - CM spoke with bedside nurse and [employer] has not delivered 3:1 nor WC to the bedside at this time.  [employer] was called and Jermaine, CM states that he will deliver to the bedside in the next 30 minutes.  Bedside nurse is aware.         Patient Goals and CMS Choice            Discharge Placement                       Discharge Plan and Services Additional resources added to the After Visit Summary for                                       Social Drivers of Health (SDOH) Interventions SDOH Screenings   Food Insecurity: No Food Insecurity (11/15/2023)  Housing: Unknown (11/15/2023)  Transportation Needs: No Transportation Needs (11/15/2023)  Utilities: Not At Risk (11/15/2023)  Depression (PHQ2-9): Low Risk  (10/03/2023)  Tobacco Use: Low Risk  (11/14/2023)     Readmission Risk Interventions     No data to display

## 2023-11-16 NOTE — Progress Notes (Signed)
    Durable Medical Equipment  (From admission, onward)           Start     Ordered   11/16/23 1244  For home use only DME Bedside commode  Once       Comments: Needs 3:1  Question:  Patient needs a bedside commode to treat with the following condition  Answer:  CVA (cerebral vascular accident) (HCC)   11/16/23 1244   11/16/23 1243  For home use only DME lightweight manual wheelchair with seat cushion  Once       Comments: Patient suffers from CVA, CHF and generalized weakness which impairs their ability to perform daily activities like toileting in the home.  A walker will not resolve  issue with performing activities of daily living. A wheelchair will allow patient to safely perform daily activities. Patient is not able to propel themselves in the home using a standard weight wheelchair due to general weakness. Patient can self propel in the lightweight wheelchair. Length of need LIfetime. Accessories: elevating leg rests (ELRs), wheel locks, extensions and anti-tippers.   11/16/23 1244

## 2023-11-16 NOTE — Consult Note (Addendum)
WOC Nurse Consult Note: Reason for Consult: Requested to assess buttock and left heel. Wound type: Full thickness on left heel. On buttock, the skin is intact, no pressure injury. Measurement: 0.5x0.5cm on the heels. Wound bed: 100% red Drainage (amount, consistency, odor) low amount, draining yellow. Periwound: macerated. Dressing procedure/placement/frequency: Left heel: Apply Xeroform (change daily) on the wound bed. Cover with foam dressing, change 3Q. Sacrum: keep the foam dressing to prevent further injuries.  WOC team will not plan to follow further.  Please reconsult if further assistance is needed. Thank-you,  Denyse Amass BSN, RN, [employer], WOC  (Pager: [phone])

## 2023-11-16 NOTE — Progress Notes (Signed)
Physical Therapy Treatment Patient Details Name: Earl [name] MRN: [id number] DOB: 13-Sep-1941 Today's Date: 11/16/2023   History of Present Illness 82 y.o. male presents to [facility] hospital on 11/14/2023 after fall at home. PMH includes PAF, cardiac amyloidosis, CHF, CVA, DMII, MGUS, HTN, FTT.    PT Comments  Pt required mod assist bed mobility, min assist transfers, and CGA amb 30' with RW. Noted R lateral lean, worsening with fatigue. Pt returned to bed at end of session. Current POC remains appropriate.     If plan is discharge home, recommend the following: A little help with walking and/or transfers;A little help with bathing/dressing/bathroom;Assistance with cooking/housework;Assist for transportation;Help with stairs or ramp for entrance   Can travel by private vehicle        Equipment Recommendations  BSC/3in1;Rolling walker (2 wheels);Wheelchair (measurements PT)    Recommendations for Other Services       Precautions / Restrictions Precautions Precautions: Fall     Mobility  Bed Mobility Overal bed mobility: Needs Assistance Bed Mobility: Supine to Sit, Sit to Supine     Supine to sit: Mod assist, HOB elevated, Used rails Sit to supine: Used rails, Mod assist   General bed mobility comments: assist with BLE and trunk    Transfers Overall transfer level: Needs assistance Equipment used: Rolling walker (2 wheels) Transfers: Sit to/from Stand Sit to Stand: From elevated surface, Min assist           General transfer comment: assist to power up and anterior translation of weight out of heels    Ambulation/Gait Ambulation/Gait assistance: Contact guard assist Gait Distance (Feet): 30 Feet Assistive device: Rolling walker (2 wheels) Gait Pattern/deviations: Step-through pattern, Steppage, Decreased dorsiflexion - right Gait velocity: decreased Gait velocity interpretation: <1.31 ft/sec, indicative of household ambulator   General Gait Details: steppage  gait on R for improved toe/foot clearance. R lean increasing with fatigue.   Stairs             Wheelchair Mobility     Tilt Bed    Modified Rankin (Stroke Patients Only)       Balance Overall balance assessment: Needs assistance, History of Falls Sitting-balance support: Single extremity supported, Bilateral upper extremity supported, Feet supported Sitting balance-Leahy Scale: Fair   Postural control: Right lateral lean Standing balance support: Bilateral upper extremity supported, During functional activity, Reliant on assistive device for balance Standing balance-Leahy Scale: Poor                              Cognition Arousal: Alert Behavior During Therapy: WFL for tasks assessed/performed Overall Cognitive Status: Within Functional Limits for tasks assessed                                          Exercises      General Comments General comments (skin integrity, edema, etc.): VSS on RA      Pertinent Vitals/Pain Pain Assessment Pain Assessment: Faces Faces Pain Scale: Hurts a little bit Pain Location: generalized Pain Descriptors / Indicators: Discomfort, Grimacing Pain Intervention(s): Monitored during session, Repositioned    Home Living                          Prior Function            PT Goals (current goals can  now be found in the care plan section) Acute Rehab PT Goals Patient Stated Goal: home Progress towards PT goals: Progressing toward goals    Frequency    Min 1X/week      PT Plan      Co-evaluation              AM-PAC PT "6 Clicks" Mobility   Outcome Measure  Help needed turning from your back to your side while in a flat bed without using bedrails?: A Little Help needed moving from lying on your back to sitting on the side of a flat bed without using bedrails?: A Lot Help needed moving to and from a bed to a chair (including a wheelchair)?: A Little Help needed standing  up from a chair using your arms (e.g., wheelchair or bedside chair)?: A Little Help needed to walk in hospital room?: A Little Help needed climbing 3-5 steps with a railing? : A Lot 6 Click Score: 16    End of Session Equipment Utilized During Treatment: Gait belt Activity Tolerance: Patient tolerated treatment well Patient left: in bed;with call bell/phone within reach;with family/visitor present Nurse Communication: Mobility status PT Visit Diagnosis: Other abnormalities of gait and mobility (R26.89);Muscle weakness (generalized) (M62.81)     Time: 4098-1191 PT Time Calculation (min) (ACUTE ONLY): 24 min  Charges:    $Gait Training: 23-37 mins PT General Charges $$ ACUTE PT VISIT: 1 Visit                     Ferd Glassing., PT  Office # [phone]    Ilda Foil 11/16/2023, 10:30 AM

## 2023-11-17 DIAGNOSIS — I480 Paroxysmal atrial fibrillation: Secondary | ICD-10-CM | POA: Diagnosis not present

## 2023-11-17 DIAGNOSIS — N182 Chronic kidney disease, stage 2 (mild): Secondary | ICD-10-CM | POA: Diagnosis not present

## 2023-11-17 DIAGNOSIS — E1122 Type 2 diabetes mellitus with diabetic chronic kidney disease: Secondary | ICD-10-CM | POA: Diagnosis not present

## 2023-11-17 DIAGNOSIS — E1141 Type 2 diabetes mellitus with diabetic mononeuropathy: Secondary | ICD-10-CM | POA: Diagnosis not present

## 2023-11-17 DIAGNOSIS — I872 Venous insufficiency (chronic) (peripheral): Secondary | ICD-10-CM | POA: Diagnosis not present

## 2023-11-17 DIAGNOSIS — I69351 Hemiplegia and hemiparesis following cerebral infarction affecting right dominant side: Secondary | ICD-10-CM | POA: Diagnosis not present

## 2023-11-23 ENCOUNTER — Telehealth (HOSPITAL_COMMUNITY): Payer: Self-pay | Admitting: Pharmacist

## 2023-11-23 ENCOUNTER — Encounter: Payer: Self-pay | Admitting: Neurology

## 2023-11-23 ENCOUNTER — Ambulatory Visit (INDEPENDENT_AMBULATORY_CARE_PROVIDER_SITE_OTHER): Payer: Medicare Other | Admitting: Nurse Practitioner

## 2023-11-23 ENCOUNTER — Ambulatory Visit: Payer: Medicare Other | Admitting: Neurology

## 2023-11-23 ENCOUNTER — Inpatient Hospital Stay: Payer: Medicare Other | Admitting: Nurse Practitioner

## 2023-11-23 ENCOUNTER — Encounter: Payer: Self-pay | Admitting: Nurse Practitioner

## 2023-11-23 ENCOUNTER — Other Ambulatory Visit (HOSPITAL_COMMUNITY): Payer: Self-pay

## 2023-11-23 VITALS — BP 100/54 | HR 59 | Temp 98.4°F | Ht 71.0 in | Wt 162.0 lb

## 2023-11-23 VITALS — BP 102/62 | HR 78 | Ht 71.0 in

## 2023-11-23 DIAGNOSIS — I952 Hypotension due to drugs: Secondary | ICD-10-CM | POA: Diagnosis not present

## 2023-11-23 DIAGNOSIS — I43 Cardiomyopathy in diseases classified elsewhere: Secondary | ICD-10-CM | POA: Diagnosis not present

## 2023-11-23 DIAGNOSIS — Z8673 Personal history of transient ischemic attack (TIA), and cerebral infarction without residual deficits: Secondary | ICD-10-CM

## 2023-11-23 DIAGNOSIS — R350 Frequency of micturition: Secondary | ICD-10-CM

## 2023-11-23 DIAGNOSIS — N401 Enlarged prostate with lower urinary tract symptoms: Secondary | ICD-10-CM | POA: Diagnosis not present

## 2023-11-23 DIAGNOSIS — E854 Organ-limited amyloidosis: Secondary | ICD-10-CM | POA: Diagnosis not present

## 2023-11-23 DIAGNOSIS — L89309 Pressure ulcer of unspecified buttock, unspecified stage: Secondary | ICD-10-CM

## 2023-11-23 DIAGNOSIS — R2681 Unsteadiness on feet: Secondary | ICD-10-CM

## 2023-11-23 DIAGNOSIS — G63 Polyneuropathy in diseases classified elsewhere: Secondary | ICD-10-CM | POA: Diagnosis not present

## 2023-11-23 DIAGNOSIS — Z79899 Other long term (current) drug therapy: Secondary | ICD-10-CM

## 2023-11-23 DIAGNOSIS — E119 Type 2 diabetes mellitus without complications: Secondary | ICD-10-CM | POA: Diagnosis not present

## 2023-11-23 DIAGNOSIS — Z09 Encounter for follow-up examination after completed treatment for conditions other than malignant neoplasm: Secondary | ICD-10-CM

## 2023-11-23 DIAGNOSIS — G9341 Metabolic encephalopathy: Secondary | ICD-10-CM

## 2023-11-23 NOTE — Telephone Encounter (Signed)
 Advanced Heart Failure Patient Advocate Encounter  Attempted to submit PA for Amvuttra  through [facility]). Received the following message: This medication or product was previously approved on [id number] from 2023-11-22 to 2024-11-20.  Tinnie Redman, PharmD, BCPS, BCCP, CPP Heart Failure Clinic Pharmacist [phone]

## 2023-11-23 NOTE — Progress Notes (Signed)
 Hospital follow up  Assessment and Plan: Hospital visit follow up for:   1. Hospital discharge follow-up (Primary) Reviewed discharge instructions in full including medication changes, diagnostics, labs, and future follow ups appointment. All questions and concerns addressed.    2. Acute metabolic encephalopathy Resolved Secondary to Baclofen  use - continue 2.5 mg dose as directed.  3. Pressure injury of skin of buttock, unspecified injury stage, unspecified laterality Follow up on Home [facility] Order thought to be placed at hospital discharge Resume [facility] with Wound Care management ASAP  4. Non-insulin  dependent type 2 diabetes mellitus (HCC) Resume oral medications; Metformin , Glipizide  Awaiting FreeStyle Libre Device  5. Benign prostatic hyperplasia with urinary frequency Considering Proscar  was stopped in-patient and patient has restarted with continued hypotensive episodes, do not continue to take Proscar  and check BP for the next 3-5 days.   Goal 120-138/70-80. Monitor BPH symptoms.  6.  Hypotension Discussed medication management Contact office for any further questions or concerns.  7. Medication management All medications discussed and reviewed in full. All questions and concerns regarding medications addressed.    Orders Placed This Encounter  Procedures   Ambulatory referral to Pain Clinic    Referral Priority:   Routine    Referral Type:   Consultation    Referral Reason:   Specialty Services Required    Requested Specialty:   Pain Medicine    Number of Visits Requested:   1   All medications were reviewed with patient and family and fully reconciled. All questions answered fully, and patient and family members were encouraged to call the office with any further questions or concerns. Discussed goal to avoid readmission related to this diagnosis.   Over 40 minutes of exam, counseling, chart review, and complex, high/moderate level critical decision making  was performed this visit.   Future Appointments  Date Time Provider Department Center  11/23/2023  3:00 PM Patel, Donika K, DO [facility] None  11/23/2023  4:00 PM Laurice President, NP [facility] None  01/02/2024  4:00 PM Wilkinson, Dana E, NP [facility] None  01/10/2024  3:45 PM Gaynel Delon CROME, DPM [facility]  02/26/2024  3:30 PM Patel, Donika K, DO [facility] None  04/09/2024  4:00 PM Tonita Fallow, MD [facility] None  04/10/2024  1:45 PM Gaynel Delon CROME, DPM [facility]  07/10/2024  4:00 PM Wilkinson, Dana E, NP [facility] None  10/15/2024  3:00 PM Tonita Fallow, MD [facility] None     HPI 83 y.o.male presents for follow up for transition from recent hospitalization or SNIF stay. Admit date to the hospital was 11/14/23, patient was discharged from the hospital on 11/16/23 and our clinical staff contacted the office the day after discharge to set up a follow up appointment. The discharge summary, medications, and diagnostic test results were reviewed before meeting with the patient. The patient was admitted for metabolic encephalopathy.   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  Patient reported that he suddenly fell on his face onto a carpet.  Denied any injury, pain and loss of consciousness.    However he reported generalized weakness and poor appetite overall.  Patient's daughter at  the bedside reported that patient has muscle spasm and patient has been recently started on  baclofen  and also receiving subcu injection Amvutra every 3 months for the management of amyloidosis induced polyneuropathy.  Patient also had a pressure ulcer of the buttock which he reports has now healed but is concerned for BLE venous status ulcers and left-sided heel to which home health wound care has treated in the past.  Patient's daughter continues to report today that home health nurse visiting him only once a week which is not sufficient for taking care of his father. This has now been increased to twice a week, however, wound care has not resumed via [facility] when this was told to daughter at discharge.   At presentation to ED patient found hypotensive 88/54, heart rate 87, O2 sat 100% room air. Blood glucose within normal range.  UA unremarkable. CBC stable H&H. CMP showed elevated blood glucose 203 otherwise unremarkable. EKG showing sinus rhythm heart rate 69.  Premature atrial complex.   CT cervical spine and CT head: 1. No evidence of an acute cervical spine fracture. 2. Grade 1 retrolisthesis at C4-C5, C5-C6 and C6-C7. 3. Cervical spondylosis as described. 4. 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  At baseline patient uses walker at home however due to pressure ulcer of the left heel unable to walk at home by himself.   For inpatient tmt of BPH holding Proscar  in the setting of hypotension.  Patient has resumed this since being discharged and continues to have low readings of BP.    Home health is involved.   Images while in the hospital: CT Head Wo Contrast Result Date: 11/14/2023 CLINICAL DATA:  Provided history: Head trauma, minor. Neck trauma. Additional history obtained from electronic MEDICAL RECORD NUMBERFall (face first), upper lip pain. EXAM: CT HEAD WITHOUT CONTRAST CT CERVICAL SPINE WITHOUT CONTRAST TECHNIQUE: Multidetector CT imaging of the head and cervical spine was performed following the standard  protocol without intravenous contrast. Multiplanar CT image reconstructions of the cervical spine were also generated. RADIATION DOSE REDUCTION: This exam was performed according to the departmental dose-optimization program which includes automated exposure control, adjustment of the mA and/or kV according to patient size and/or use of iterative reconstruction technique. COMPARISON:  Head CT 10/11/2023. Cervical spine MRI 09/03/2022. CTA head/neck 07/26/2017. FINDINGS: CT HEAD FINDINGS Brain: Generalized cerebral and cerebellar atrophy. Patchy and ill-defined hypoattenuation within the cerebral white matter, nonspecific but compatible with mild chronic small vessel ischemic disease. Known chronic infarct within the superior right cerebellar hemisphere (SCA vascular territory). There is no acute intracranial hemorrhage. No demarcated cortical infarct. No extra-axial fluid collection. No evidence of an intracranial mass. No midline shift. Vascular: No hyperdense vessel. Atherosclerotic calcifications. Skull: No calvarial fracture or aggressive osseous lesion. Sinuses/Orbits: No mass or acute finding within the imaged orbits. Minimal mucosal thickening within the bilateral ethmoid and left frontal sinuses. CT CERVICAL SPINE FINDINGS Alignment: 2-3 mm grade 1 retrolisthesis at C4-C5, C5-C6 and C6-C7. Skull base and vertebrae: The basion-dental and atlanto-dental intervals are maintained.No evidence of acute fracture to the cervical spine. Soft tissues and spinal canal: No prevertebral fluid or swelling. No visible canal hematoma. Disc levels: Cervical spondylosis with multilevel disc space narrowing, disc bulges/central disc protrusions, posterior disc osteophyte complexes, uncovertebral hypertrophy and facet arthrosis. Disc space narrowing is greatest at C3-C4 (moderate to advanced) and C6-C7 (advanced). No appreciable high-grade spinal canal stenosis. Multilevel bony neural foraminal narrowing. Upper  chest: No  consolidation within the imaged lung apices. No visible pneumothorax. 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 Partially imaged, known left thyroid  lobe nodule. By report, this nodule has been previously biopsied. Correlate with the pathology result from this procedure. IMPRESSION: CT head: 1.  No evidence of an acute intracranial abnormality. 2. Parenchymal atrophy, chronic small vessel ischemic disease and chronic right cerebellar infarct as described. CT cervical spine: 1. No evidence of an acute cervical spine fracture. 2. Grade 1 retrolisthesis at C4-C5, C5-C6 and C6-C7. 3. Cervical spondylosis as described. 4. 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 Electronically Signed   By: Rockey Childs D.O.   On: 11/14/2023 16:53   CT Cervical Spine Wo Contrast Result Date: 11/14/2023 CLINICAL DATA:  Provided history: Head trauma, minor. Neck trauma. Additional history obtained from electronic MEDICAL RECORD NUMBERFall (face first), upper lip pain. EXAM: CT HEAD WITHOUT CONTRAST CT CERVICAL SPINE WITHOUT CONTRAST TECHNIQUE: Multidetector CT imaging of the head and cervical spine was performed following the standard protocol without intravenous contrast. Multiplanar CT image reconstructions of the cervical spine were also generated. RADIATION DOSE REDUCTION: This exam was performed according to the departmental dose-optimization program which includes automated exposure control, adjustment of the mA and/or kV according to patient size and/or use of iterative reconstruction technique. COMPARISON:  Head CT 10/11/2023. Cervical spine MRI 09/03/2022. CTA head/neck 07/26/2017. FINDINGS: CT HEAD FINDINGS Brain: Generalized cerebral and cerebellar atrophy. Patchy and ill-defined hypoattenuation within the cerebral white matter, nonspecific but compatible with mild chronic small vessel ischemic  disease. Known chronic infarct within the superior right cerebellar hemisphere (SCA vascular territory). There is no acute intracranial hemorrhage. No demarcated cortical infarct. No extra-axial fluid collection. No evidence of an intracranial mass. No midline shift. Vascular: No hyperdense vessel. Atherosclerotic calcifications. Skull: No calvarial fracture or aggressive osseous lesion. Sinuses/Orbits: No mass or acute finding within the imaged orbits. Minimal mucosal thickening within the bilateral ethmoid and left frontal sinuses. CT CERVICAL SPINE FINDINGS Alignment: 2-3 mm grade 1 retrolisthesis at C4-C5, C5-C6 and C6-C7. Skull base and vertebrae: The basion-dental and atlanto-dental intervals are maintained.No evidence of acute fracture to the cervical spine. Soft tissues and spinal canal: No prevertebral fluid or swelling. No visible canal hematoma. Disc levels: Cervical spondylosis with multilevel disc space narrowing, disc bulges/central disc protrusions, posterior disc osteophyte complexes, uncovertebral hypertrophy and facet arthrosis. Disc space narrowing is greatest at C3-C4 (moderate to advanced) and C6-C7 (advanced). No appreciable high-grade spinal canal stenosis. Multilevel bony neural foraminal narrowing. Upper chest: No consolidation within the imaged lung apices. No visible pneumothorax. 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 Partially imaged, known left thyroid  lobe nodule. By report, this nodule has been previously biopsied. Correlate with the pathology result from this procedure. IMPRESSION: CT head: 1.  No evidence of an acute intracranial abnormality. 2. Parenchymal atrophy, chronic small vessel ischemic disease and chronic right cerebellar infarct as described. CT cervical spine: 1. No evidence of an acute cervical spine fracture. 2. Grade 1 retrolisthesis at C4-C5, C5-C6 and C6-C7. 3. Cervical spondylosis as described. 4.  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 Electronically Signed   By: Rockey Childs D.O.   On: 11/14/2023 16:53     Current Outpatient Medications (Endocrine &  Metabolic):    glipiZIDE  (GLUCOTROL ) 5 MG tablet, Take 1/2 to 1 tablet 3 x / day with Meals for Diabetes (Patient taking differently: Take 2.5 mg by mouth 3 (three) times daily with meals. for Diabetes)   levothyroxine  (SYNTHROID ) 50 MCG tablet, TAKE 1 TABLET BY MOUTH DAILY ON  AN EMPTY STOMACH WITH ONLY WATER FOR 30 MINUTES AND NO ANTACID,  MEDS, CALCIUM  OR MAGNESIUM  FOR 4 HOURS AND AVOID BIOTIN (Patient taking differently: Take 25-50 mcg by mouth See admin instructions. Take 1 tablet by mouth on Sun, Tues, Thurs and 1/2 tablet all other days ON AN EMPTY STOMACH WITH ONLY WATER FOR 30 MINUTES AND NO ANTACID,  MEDS, CALCIUM  OR MAGNESIUM  FOR 4 HOURS AND AVOID BIOTIN)   metFORMIN  (GLUCOPHAGE -XR) 500 MG 24 hr tablet, TAKE 2 TABLETS BY MOUTH TWICE  DAILY WITH MEALS FOR DIABETES  Current Outpatient Medications (Cardiovascular):    atorvastatin  (LIPITOR) 40 MG tablet, Take  1 tablet  Daily for Cholesterol                                                       /                                  TAKE                                                       BY                                           MOUTH   metoprolol  succinate (TOPROL -XL) 50 MG 24 hr tablet, TAKE 1 TABLET BY MOUTH AT  BEDTIME   sacubitril -valsartan  (ENTRESTO ) 24-26 MG, Take 1 tablet by mouth 2 (two) times daily.   spironolactone  (ALDACTONE ) 25 MG tablet, Take 1 tablet (25 mg total) by mouth at bedtime.   Tafamidis  (VYNDAMAX ) 61 MG CAPS, Take 1 capsule by mouth daily.   torsemide  (DEMADEX ) 20 MG tablet, Take  1 tablet   2 x /day  for Fluid Retention & Leg / Ankle Swelling    Current Outpatient Medications (Hematological):    Cyanocobalamin  (B-12) 50 MCG TABS, Take 50 mcg by mouth daily.   ELIQUIS  5 MG TABS tablet, TAKE  1 TABLET BY MOUTH TWICE  DAILY TO PREVENT BLOOD CLOTS   ferrous sulfate  325 (65 FE) MG tablet, Take 325 mg by mouth at bedtime.  Current Outpatient Medications (Other):    AMVUTTRA  25 MG/0.5ML syringe, INJECT 25MG  (0.5ML) SUBCUTANEOUSLY ONCE EVERY 3 MONTHS. ROTATE ADMINISTRATION SITE WITH EACH INJECTION. *REFRIGERATE. DO NOT SHAKE.*   Baclofen  5 MG TABS, Take 0.5 tablets (2.5 mg total) by mouth 2 (two) times daily.   Blood Glucose Monitoring Suppl (ONE TOUCH ULTRA 2) w/Device KIT, Check blood sugar 1 time a day-DX-E11.9   Cholecalciferol  (VITAMIN D  PO), Take 1 tablet by mouth daily.    clotrimazole -betamethasone  (LOTRISONE ) cream, Apply to affected area 2 to 4 x / day   Continuous Glucose Receiver (  FREESTYLE LIBRE 3 READER) DEVI, Use with freestyle libre sensor   Continuous Glucose Sensor (FREESTYLE LIBRE 3 SENSOR) MISC, Place 1 sensor on the skin every 14 days. Use to check glucose continuously   Emollient (AQUAPHOR ADV PROTECT HEALING) OINT, Apply small amount to two areas on buttocks daily to prevent breakdown   finasteride  (PROSCAR ) 5 MG tablet, TAKE 1 TABLET BY MOUTH DAILY FOR PROSTATE   glucose blood (ONETOUCH ULTRA) test strip, Use as instructed   Lancets (ONETOUCH DELICA PLUS LANCET30G) MISC, CHECK BLOOD SUGAR ONCE A DAY.   Lancets Misc. (ONE TOUCH SURESOFT) MISC, Check blood sugar 1 time a day-DX-E11.91   Magnesium  250 MG TABS, Take 250 mg by mouth daily.    Multiple Vitamin (MULTIVITAMIN) capsule, Take 1 capsule by mouth daily.   NONFORMULARY OR COMPOUNDED ITEM, Please dispense a Rolling Walker.  Dx:R26.81 & G63   potassium chloride  SA (KLOR-CON  M20) 20 MEQ tablet, Take 1 tablet by mouth once daily   triamcinolone  cream (KENALOG ) 0.1 %, Apply 1 Application topically as needed.   trolamine salicylate (ASPERCREME) 10 % cream, Apply 1 application topically daily as needed for muscle pain.   VITAMIN A  PO, Take by mouth. 3000 mg   vitamin C  (ASCORBIC ACID ) 500 MG tablet, Take 500 mg by  mouth daily.  Past Medical History:  Diagnosis Date   Adenomatous colon polyp    Allergy    Anal fissure    Arthritis    BPH (benign prostatic hyperplasia)    Diabetes mellitus (HCC)    GERD (gastroesophageal reflux disease)    Hyperlipidemia    Hypertension    Hypothyroidism    Other testicular hypofunction    Stroke (HCC)      Allergies  Allergen Reactions   Penicillins Other (See Comments)    Did it involve swelling of the face/tongue/throat, SOB, or low BP? Unknown Did it involve sudden or severe rash/hives, skin peeling, or any reaction on the inside of your mouth or nose? Unknown Did you need to seek medical attention at a hospital or doctor's office? No When did it last happen?    Over 69 Years Ago   If all above answers are NO, may proceed with cephalosporin use.      ROS: all negative except above.   Physical Exam: There were no vitals filed for this visit. There were no vitals taken for this visit. General Appearance: Well nourished, in no apparent distress. Eyes: PERRLA, EOMs, conjunctiva no swelling or erythema Sinuses: No Frontal/maxillary tenderness ENT/Mouth: Ext aud canals clear, TMs without erythema, bulging. No erythema, swelling, or exudate on post pharynx.  Tonsils not swollen or erythematous. Hearing normal.  Neck: Supple, thyroid  normal.  Respiratory: Respiratory effort normal, BS equal bilaterally without rales, rhonchi, wheezing or stridor.  Cardio: RRR with no MRGs. Brisk peripheral pulses without edema.  Abdomen: Soft, + BS.  Non tender, no guarding, rebound, hernias, masses. Lymphatics: Non tender without lymphadenopathy.  Musculoskeletal: Full ROM, 5/5 strength, normal gait.  Skin: Warm, dry without rashes, lesions, ecchymosis.  Neuro: Cranial nerves intact. Normal muscle tone, no cerebellar symptoms. Sensation intact.  Psych: Awake and oriented X 3, normal affect, Insight and Judgment appropriate.     BASCOM NECESSARY, NP 2:26  PM [facility]

## 2023-11-23 NOTE — Progress Notes (Signed)
 Follow-up Visit   Date: 11/23/2023    Earl [name] MRN: [id number] DOB: 1941/10/03    Earl [name] is a 83 y.o. 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  He is followed here for ATTR-neuropathy and gait unsteadiness.  The patient was accompanied to the clinic by daughter who also provides collateral information.    IMPRESSION/PLAN: Gait instability and falls due to neuropathy and history of cerebellar stroke. He may also be developing anterocollis which is altering his center of gravity.   Management remains supportive.   - Continue home PT and gait assist devices.  He uses a walker and now has a wheelchair, the latter will be more of a transport chair as I do not think he will be able to use it himself.   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  - Continue Amvuttra  - Continue daily multivitamin                 3.  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  He was referred for neurological evaluation of his neuropathy.  Patient has a long history of neuropathy, which initially manifested with burning pain in the feet.  However, over the years, he no longer has any pain.  He takes gabapentin  600mg  at bedtime.   Now, he has  predominately numbness in the feet and lower legs.  He has weakness of the right leg from stroke.  He has poor balance and has been using a rigid walker for the past 1.5 year.  He would like to use a cane, but it's too unsteady.  He uses a transport chair when outside of the home.  He had one fall this year. Diabetes is well-controlled.    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     He lives in a one level home and daughter is the primary care giver.    Nonsmoker and never drank alcohol .  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    UPDATE 12/14/2022:  He is here for follow-up visit accompanied by daughter.  Daughter reports she has noticed that he tends to cough with eating and is concerned of risk of aspiration.  He has not had any choking spells.  She also is concerned that he tends to drag the right leg when walking and is very unsteady.  He has completed home PT which has helped and would like it extended.  He is having ongoing difficulty with holding objects and tends to drop things frequently.    UPDATE 02/24/2023:  Daughter scheduled a sooner visit due to patient having increased falls over the past two weeks.  He had a fall in front of his daughter and another fall with his caregiver were walking outside.  He could tell he was going to fall because of leg weakness, but unable to break  his fall.  Fortunately, he has not had any injuries.  Daughter feels that he is dragging the right leg more and speech is also slurred at times.    UPDATE 04/25/2023:  He is here for follow-up visit.  He has been doing well with home PT and feels that it has helped.  He did have one fall, but fortunately did not hurt himself.  He was able to stand up without assistance.  Neuropathy is unchanged and remains with numbness in the hands.  Fine motor tasks are challenging.  He is tolerating Amvuttra  and due to for next injection. He  will be celebrating his 82nd birthday this weekend and is in good spirits today.   UPDATE 08/28/2023:  He is here for follow-up visit.   He denies any new weakness.  His gait and balance is getting slightly worse and daughter feels legs are getting stiff again.  He has fallen twice.  Neuropathy continues to involve the hands.  No new complaints.   UPDATE 11/22/2022:  He is here for acute visit due to fall which led to hospitalization.  He had a fall on 12/24 after falling forwards and landing on his face.  He has superficial injury.  Son found him on the floor and ultimately family decided to have him evaluated in the ER.  CT head did not show any new proceed. He was very somnolent and admitted for evaluation.  His baclofen  was reduced to 2.5mg  BID due to concern of medication side effect. Daughter has noticed that since his PT has been less frequent, he has started to lean forwards and to the right more.  Even when using the walker, he tends to lean forward on it and posture has become poor.    Medications:  Current Outpatient Medications on File Prior to Visit  Medication Sig Dispense Refill   AMVUTTRA  25 MG/0.5ML syringe INJECT 25MG  (0.5ML) SUBCUTANEOUSLY ONCE EVERY 3 MONTHS. ROTATE ADMINISTRATION SITE WITH EACH INJECTION. *REFRIGERATE. DO NOT SHAKE.* 0.5 mL 2   atorvastatin  (LIPITOR) 40 MG tablet Take  1 tablet  Daily for Cholesterol                                                       /                                  TAKE                                                       BY                                           MOUTH 90 tablet 3   Baclofen  5 MG TABS Take 0.5 tablets (2.5 mg total) by mouth 2 (two) times daily.     Blood Glucose Monitoring Suppl (ONE TOUCH ULTRA 2) w/Device KIT Check blood sugar 1 time a day-DX-E11.9 1 kit 0   Cholecalciferol  (VITAMIN D  PO) Take 1 tablet  by mouth daily.      clotrimazole -betamethasone  (LOTRISONE ) cream Apply to affected area 2 to 4 x / day 45 g 3    Continuous Glucose Receiver (FREESTYLE LIBRE 3 READER) DEVI Use with freestyle libre sensor 1 each 1   Continuous Glucose Sensor (FREESTYLE LIBRE 3 SENSOR) MISC Place 1 sensor on the skin every 14 days. Use to check glucose continuously 2 each 3   Cyanocobalamin  (B-12) 50 MCG TABS Take 50 mcg by mouth daily.     ELIQUIS  5 MG TABS tablet TAKE 1 TABLET BY MOUTH TWICE  DAILY TO PREVENT BLOOD CLOTS 200 tablet 2   Emollient (AQUAPHOR ADV PROTECT HEALING) OINT Apply small amount to two areas on buttocks daily to prevent breakdown 99 g 1   ferrous sulfate  325 (65 FE) MG tablet Take 325 mg by mouth at bedtime.     finasteride  (PROSCAR ) 5 MG tablet TAKE 1 TABLET BY MOUTH DAILY FOR PROSTATE 100 tablet 2   glipiZIDE  (GLUCOTROL ) 5 MG tablet Take 1/2 to 1 tablet 3 x / day with Meals for Diabetes (Patient taking differently: Take 2.5 mg by mouth 3 (three) times daily with meals. for Diabetes) 270 tablet 3   glucose blood (ONETOUCH ULTRA) test strip Use as instructed 100 strip 3   Lancets (ONETOUCH DELICA PLUS LANCET30G) MISC CHECK BLOOD SUGAR ONCE A DAY. 100 each 3   Lancets Misc. (ONE TOUCH SURESOFT) MISC Check blood sugar 1 time a day-DX-E11.91 1 each 0   levothyroxine  (SYNTHROID ) 50 MCG tablet TAKE 1 TABLET BY MOUTH DAILY ON  AN EMPTY STOMACH WITH ONLY WATER FOR 30 MINUTES AND NO ANTACID,  MEDS, CALCIUM  OR MAGNESIUM  FOR 4 HOURS AND AVOID BIOTIN (Patient taking differently: Take 25-50 mcg by mouth See admin instructions. Take 1 tablet by mouth on Sun, Tues, Thurs and 1/2 tablet all other days ON AN EMPTY STOMACH WITH ONLY WATER FOR 30 MINUTES AND NO ANTACID,  MEDS, CALCIUM  OR MAGNESIUM  FOR 4 HOURS AND AVOID BIOTIN) 100 tablet 2   Magnesium  250 MG TABS Take 250 mg by mouth daily.      metFORMIN  (GLUCOPHAGE -XR) 500 MG 24 hr tablet TAKE 2 TABLETS BY MOUTH TWICE  DAILY WITH MEALS FOR DIABETES 400 tablet 2   metoprolol  succinate (TOPROL -XL) 50 MG 24 hr tablet TAKE 1 TABLET BY MOUTH AT  BEDTIME 90 tablet 0   Multiple  Vitamin (MULTIVITAMIN) capsule Take 1 capsule by mouth daily.     NONFORMULARY OR COMPOUNDED ITEM Please dispense a Rolling Walker.  Dx:R26.81 & G63 1 each 0   potassium chloride  SA (KLOR-CON  M20) 20 MEQ tablet Take 1 tablet by mouth once daily 90 tablet 3   sacubitril -valsartan  (ENTRESTO ) 24-26 MG Take 1 tablet by mouth 2 (two) times daily. 180 tablet 3   spironolactone  (ALDACTONE ) 25 MG tablet Take 1 tablet (25 mg total) by mouth at bedtime. 90 tablet 3   Tafamidis  (VYNDAMAX ) 61 MG CAPS Take 1 capsule by mouth daily. 30 capsule 11   torsemide  (DEMADEX ) 20 MG tablet Take  1 tablet   2 x /day  for Fluid Retention & Leg / Ankle Swelling 180 tablet 3   triamcinolone  cream (KENALOG ) 0.1 % Apply 1 Application topically as needed.     trolamine salicylate (ASPERCREME) 10 % cream Apply 1 application topically daily as needed for muscle pain.     VITAMIN A  PO Take by mouth. 3000 mg     vitamin C  (ASCORBIC ACID ) 500 MG tablet Take 500 mg by  mouth daily.     No current facility-administered medications on file prior to visit.    Allergies:  Allergies  Allergen Reactions   Penicillins Other (See Comments)    Did it involve swelling of the face/tongue/throat, SOB, or low BP? Unknown Did it involve sudden or severe rash/hives, skin peeling, or any reaction on the inside of your mouth or nose? Unknown Did you need to seek medical attention at a hospital or doctor's office? No When did it last happen?    Over 26 Years Ago   If all above answers are NO, may proceed with cephalosporin use.      Vital Signs:  BP 102/62   Pulse 78   Ht 5' 11 (1.803 m)   SpO2 97%   BMI 21.12 kg/m    Neurological Exam: MENTAL STATUS including orientation to time, place, person, recent and remote memory, attention span and concentration, language, and fund of knowledge is normal.  Speech is mildly dysarthric with prolonged talking (stable)  CRANIAL NERVES:     Normal conjugate, extra-ocular eye movements in  all directions of gaze.  No ptosis.  Face is symmetric.  MOTOR:  Severe bilateral ABP atrophy, no fasciculations or abnormal movements.  Motor strength is 5/5 RUE proximally, 3/5 distally, RLE 5-/5, LUE and LLE 5/5.  No pronator drift.  Spasticity in the RLE.  MSRs:                                           Right        Left brachioradialis 3+  3+  biceps 3+  3+  triceps 3+  3+  patellar 3+  3+  ankle jerk 3+  3+   SENSORY:  Vibration absent below the ankles and reduced at the knees.     COORDINATION/GAIT: Mild dysmetria with right finger to nose testing.  Intact rapid alternating movements bilaterally. Gait not tested, as patient arrived in wheelchair. He is sitting upright with mild flexion at the neck.    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 A superimposed severe left carpal tunnel syndrome is also present.   MRI cervical spine wo contrast 09/06/2022: 1. Normal appearance of the cervical cord. 2. Ordinary cervical spine degeneration with foraminal impingement bilaterally at C3-4, C4-5 and on the right at C6-7.  MRI brain 07/26/2017: 1. Acute ischemic superior right cerebellar infarct without significant mass effect. Associated mild petechial hemorrhage without frank hemorrhagic transformation. 2. Otherwise normal brain MRI.  Total time spent reviewing records, interview, history/exam, documentation, and coordination of care on day of encounter:  30 min    Thank you for allowing me to participate in patient's care.  If I can answer any additional questions, I would be pleased to do so.    Sincerely,    Liel Rudden K. Tobie, DO

## 2023-11-24 ENCOUNTER — Other Ambulatory Visit: Payer: Self-pay | Admitting: Nurse Practitioner

## 2023-11-24 ENCOUNTER — Encounter: Payer: Self-pay | Admitting: Nurse Practitioner

## 2023-11-24 DIAGNOSIS — E1141 Type 2 diabetes mellitus with diabetic mononeuropathy: Secondary | ICD-10-CM | POA: Diagnosis not present

## 2023-11-24 DIAGNOSIS — L89309 Pressure ulcer of unspecified buttock, unspecified stage: Secondary | ICD-10-CM

## 2023-11-24 DIAGNOSIS — I69351 Hemiplegia and hemiparesis following cerebral infarction affecting right dominant side: Secondary | ICD-10-CM | POA: Diagnosis not present

## 2023-11-24 DIAGNOSIS — I872 Venous insufficiency (chronic) (peripheral): Secondary | ICD-10-CM | POA: Diagnosis not present

## 2023-11-24 DIAGNOSIS — I480 Paroxysmal atrial fibrillation: Secondary | ICD-10-CM | POA: Diagnosis not present

## 2023-11-24 DIAGNOSIS — N182 Chronic kidney disease, stage 2 (mild): Secondary | ICD-10-CM | POA: Diagnosis not present

## 2023-11-24 DIAGNOSIS — E1122 Type 2 diabetes mellitus with diabetic chronic kidney disease: Secondary | ICD-10-CM | POA: Diagnosis not present

## 2023-11-24 NOTE — Patient Instructions (Signed)
Hypotension As your heart beats, it forces blood through your body. This force is called blood pressure. If you have hypotension, you have low blood pressure.  When your blood pressure is too low, you  not get enough blood to your brain or other parts of your body. This  cause you to feel weak, light-headed, have a fast heartbeat, or even faint. Low blood pressure  be harmless, or it  cause serious problems. What are the causes? Blood loss. Not enough water in the body (dehydration). Heart problems. Hormone problems. Pregnancy. A very bad infection. Not having enough of certain nutrients. Very bad allergic reactions. Certain medicines. What increases the risk? Age. The risk increases as you get older. Conditions that affect the heart or the brain and spinal cord (central nervous system). What are the signs or symptoms? Feeling: Weak. Light-headed. Dizzy. Tired (fatigued). Blurred vision. Fast heartbeat. Fainting, in very bad cases. How is this treated? Changing your diet. This  involve drinking more water or including more salt (sodium) in your diet by eating high-salt foods. Taking medicines to raise your blood pressure. Changing how much you take (the dosage) of some of your medicines. Wearing compression stockings. These stockings help to prevent blood clots and reduce swelling in your legs. In some cases, you  need to go to the hospital to: Receive fluids through an IV tube. Receive donated blood through an IV tube (transfusion). Get treated for an infection or heart problems, if this applies. Be monitored while medicines that you are taking wear off. Follow these instructions at home: Eating and drinking  Drink enough fluids to keep your pee (urine) pale yellow. Eat a healthy diet. Follow instructions from your doctor about what you can eat or drink. A healthy diet includes: Fresh fruits and vegetables. Whole grains. Low-fat (lean) meats. Low-fat  dairy products. If told, include more salt in your diet. Do not add extra salt to your diet unless your doctor tells you to. Eat small meals often. Avoid standing up quickly after you eat. Medicines Take over-the-counter and prescription medicines only as told by your doctor. Follow instructions from your doctor about changing how much you take of your medicines, if this applies. Do not stop or change any of your medicines on your own. General instructions  Wear compression stockings as told by your doctor. Get up slowly from lying down or sitting. Avoid hot showers and a lot of heat as told by your doctor. Return to your normal activities when your doctor says that it is safe. Do not smoke or use any products that contain nicotine or tobacco. If you need help quitting, ask your doctor. Keep all follow-up visits. Contact a doctor if: You vomit. You have watery poop (diarrhea). You have a fever for more than 2-3 days. You feel more thirsty than normal. You feel weak and tired. Get help right away if: You have chest pain. You have a fast or uneven heartbeat. You lose feeling (have numbness) in any part of your body. You cannot move your arms or your legs. You have trouble talking. You get sweaty or feel light-headed. You faint. You have trouble breathing. You have trouble staying awake. You feel mixed up (confused). These symptoms  be an emergency. Get help right away. Call 911. Do not wait to see if the symptoms will go away. Do not drive yourself to the hospital. Summary Hypotension is also called low blood pressure. It is when the force of blood pumping through your body   is too weak. Hypotension  be harmless, or it  cause serious problems. Treatment  include changing your diet and medicines, and wearing compression stockings. In very bad cases, you  need to go to the hospital. This information is not intended to replace advice given to you by your health care  provider. Make sure you discuss any questions you have with your health care provider. Document Revised: 06/28/2021 Document Reviewed: 06/28/2021 Elsevier Patient Education  2024 [employer]  

## 2023-11-24 NOTE — Telephone Encounter (Signed)
Can you look at this? Thank you.

## 2023-11-24 NOTE — Telephone Encounter (Signed)
 Another message from daughter

## 2023-11-28 ENCOUNTER — Other Ambulatory Visit (HOSPITAL_COMMUNITY): Payer: Self-pay

## 2023-11-28 DIAGNOSIS — I480 Paroxysmal atrial fibrillation: Secondary | ICD-10-CM | POA: Diagnosis not present

## 2023-11-28 DIAGNOSIS — N182 Chronic kidney disease, stage 2 (mild): Secondary | ICD-10-CM | POA: Diagnosis not present

## 2023-11-28 DIAGNOSIS — E1122 Type 2 diabetes mellitus with diabetic chronic kidney disease: Secondary | ICD-10-CM | POA: Diagnosis not present

## 2023-11-28 DIAGNOSIS — I872 Venous insufficiency (chronic) (peripheral): Secondary | ICD-10-CM | POA: Diagnosis not present

## 2023-11-28 DIAGNOSIS — E1141 Type 2 diabetes mellitus with diabetic mononeuropathy: Secondary | ICD-10-CM | POA: Diagnosis not present

## 2023-11-28 DIAGNOSIS — I69351 Hemiplegia and hemiparesis following cerebral infarction affecting right dominant side: Secondary | ICD-10-CM | POA: Diagnosis not present

## 2023-12-01 ENCOUNTER — Other Ambulatory Visit (HOSPITAL_COMMUNITY): Payer: Self-pay

## 2023-12-01 ENCOUNTER — Encounter (HOSPITAL_COMMUNITY): Payer: Self-pay

## 2023-12-01 DIAGNOSIS — E1141 Type 2 diabetes mellitus with diabetic mononeuropathy: Secondary | ICD-10-CM | POA: Diagnosis not present

## 2023-12-01 DIAGNOSIS — I872 Venous insufficiency (chronic) (peripheral): Secondary | ICD-10-CM | POA: Diagnosis not present

## 2023-12-01 DIAGNOSIS — N182 Chronic kidney disease, stage 2 (mild): Secondary | ICD-10-CM | POA: Diagnosis not present

## 2023-12-01 DIAGNOSIS — E1122 Type 2 diabetes mellitus with diabetic chronic kidney disease: Secondary | ICD-10-CM | POA: Diagnosis not present

## 2023-12-01 DIAGNOSIS — I69351 Hemiplegia and hemiparesis following cerebral infarction affecting right dominant side: Secondary | ICD-10-CM | POA: Diagnosis not present

## 2023-12-01 DIAGNOSIS — I480 Paroxysmal atrial fibrillation: Secondary | ICD-10-CM | POA: Diagnosis not present

## 2023-12-04 ENCOUNTER — Other Ambulatory Visit: Payer: Self-pay

## 2023-12-04 ENCOUNTER — Other Ambulatory Visit (HOSPITAL_COMMUNITY): Payer: Self-pay

## 2023-12-04 NOTE — Progress Notes (Signed)
 Patient's daughter, [name] called back to refill Vyndamax. Claim came back with likely to beneift from [facility] payment plan. Reached out to [facility] for assistance and will follow back up with daughter. She can best be reached at [phone].

## 2023-12-04 NOTE — Progress Notes (Signed)
 Specialty Pharmacy Refill Coordination Note  Earl [name] is a 83 y.o. male contacted today regarding refills of specialty medication(s) Tafamidis  (Vyndamax )   Patient requested Delivery   Delivery date: 12/07/23   Verified address: [address]   Medication will be filled on 12/06/23.

## 2023-12-05 DIAGNOSIS — I69351 Hemiplegia and hemiparesis following cerebral infarction affecting right dominant side: Secondary | ICD-10-CM | POA: Diagnosis not present

## 2023-12-05 DIAGNOSIS — I872 Venous insufficiency (chronic) (peripheral): Secondary | ICD-10-CM | POA: Diagnosis not present

## 2023-12-05 DIAGNOSIS — E1141 Type 2 diabetes mellitus with diabetic mononeuropathy: Secondary | ICD-10-CM | POA: Diagnosis not present

## 2023-12-05 DIAGNOSIS — N182 Chronic kidney disease, stage 2 (mild): Secondary | ICD-10-CM | POA: Diagnosis not present

## 2023-12-05 DIAGNOSIS — E1122 Type 2 diabetes mellitus with diabetic chronic kidney disease: Secondary | ICD-10-CM | POA: Diagnosis not present

## 2023-12-05 DIAGNOSIS — I480 Paroxysmal atrial fibrillation: Secondary | ICD-10-CM | POA: Diagnosis not present

## 2023-12-06 ENCOUNTER — Other Ambulatory Visit: Payer: Self-pay

## 2023-12-07 DIAGNOSIS — I480 Paroxysmal atrial fibrillation: Secondary | ICD-10-CM | POA: Diagnosis not present

## 2023-12-07 DIAGNOSIS — I69351 Hemiplegia and hemiparesis following cerebral infarction affecting right dominant side: Secondary | ICD-10-CM | POA: Diagnosis not present

## 2023-12-07 DIAGNOSIS — E1122 Type 2 diabetes mellitus with diabetic chronic kidney disease: Secondary | ICD-10-CM | POA: Diagnosis not present

## 2023-12-07 DIAGNOSIS — E1141 Type 2 diabetes mellitus with diabetic mononeuropathy: Secondary | ICD-10-CM | POA: Diagnosis not present

## 2023-12-07 DIAGNOSIS — N182 Chronic kidney disease, stage 2 (mild): Secondary | ICD-10-CM | POA: Diagnosis not present

## 2023-12-07 DIAGNOSIS — I872 Venous insufficiency (chronic) (peripheral): Secondary | ICD-10-CM | POA: Diagnosis not present

## 2023-12-11 ENCOUNTER — Other Ambulatory Visit (HOSPITAL_COMMUNITY): Payer: Self-pay | Admitting: Cardiology

## 2023-12-11 DIAGNOSIS — E1122 Type 2 diabetes mellitus with diabetic chronic kidney disease: Secondary | ICD-10-CM | POA: Diagnosis not present

## 2023-12-11 DIAGNOSIS — I872 Venous insufficiency (chronic) (peripheral): Secondary | ICD-10-CM | POA: Diagnosis not present

## 2023-12-11 DIAGNOSIS — I480 Paroxysmal atrial fibrillation: Secondary | ICD-10-CM | POA: Diagnosis not present

## 2023-12-11 DIAGNOSIS — I69351 Hemiplegia and hemiparesis following cerebral infarction affecting right dominant side: Secondary | ICD-10-CM | POA: Diagnosis not present

## 2023-12-11 DIAGNOSIS — N182 Chronic kidney disease, stage 2 (mild): Secondary | ICD-10-CM | POA: Diagnosis not present

## 2023-12-11 DIAGNOSIS — E1141 Type 2 diabetes mellitus with diabetic mononeuropathy: Secondary | ICD-10-CM | POA: Diagnosis not present

## 2023-12-13 ENCOUNTER — Telehealth: Payer: Self-pay | Admitting: Neurology

## 2023-12-13 NOTE — Telephone Encounter (Signed)
OK to give orders.

## 2023-12-13 NOTE — Telephone Encounter (Signed)
Mon[facility] called and was provided verbal orders.

## 2023-12-13 NOTE — Telephone Encounter (Signed)
[facility] called they need orders for 1 time a week for 2 week with recertification.

## 2023-12-13 NOTE — Telephone Encounter (Signed)
Called Earl [name] and left a message for a call back.

## 2023-12-14 DIAGNOSIS — I480 Paroxysmal atrial fibrillation: Secondary | ICD-10-CM | POA: Diagnosis not present

## 2023-12-14 DIAGNOSIS — I872 Venous insufficiency (chronic) (peripheral): Secondary | ICD-10-CM | POA: Diagnosis not present

## 2023-12-14 DIAGNOSIS — I69351 Hemiplegia and hemiparesis following cerebral infarction affecting right dominant side: Secondary | ICD-10-CM | POA: Diagnosis not present

## 2023-12-14 DIAGNOSIS — E1141 Type 2 diabetes mellitus with diabetic mononeuropathy: Secondary | ICD-10-CM | POA: Diagnosis not present

## 2023-12-14 DIAGNOSIS — N182 Chronic kidney disease, stage 2 (mild): Secondary | ICD-10-CM | POA: Diagnosis not present

## 2023-12-14 DIAGNOSIS — E1122 Type 2 diabetes mellitus with diabetic chronic kidney disease: Secondary | ICD-10-CM | POA: Diagnosis not present

## 2023-12-19 DIAGNOSIS — E1141 Type 2 diabetes mellitus with diabetic mononeuropathy: Secondary | ICD-10-CM | POA: Diagnosis not present

## 2023-12-19 DIAGNOSIS — I480 Paroxysmal atrial fibrillation: Secondary | ICD-10-CM | POA: Diagnosis not present

## 2023-12-19 DIAGNOSIS — I872 Venous insufficiency (chronic) (peripheral): Secondary | ICD-10-CM | POA: Diagnosis not present

## 2023-12-19 DIAGNOSIS — E1122 Type 2 diabetes mellitus with diabetic chronic kidney disease: Secondary | ICD-10-CM | POA: Diagnosis not present

## 2023-12-19 DIAGNOSIS — N182 Chronic kidney disease, stage 2 (mild): Secondary | ICD-10-CM | POA: Diagnosis not present

## 2023-12-19 DIAGNOSIS — I69351 Hemiplegia and hemiparesis following cerebral infarction affecting right dominant side: Secondary | ICD-10-CM | POA: Diagnosis not present

## 2023-12-21 ENCOUNTER — Encounter: Payer: Self-pay | Admitting: Nurse Practitioner

## 2023-12-21 DIAGNOSIS — E1141 Type 2 diabetes mellitus with diabetic mononeuropathy: Secondary | ICD-10-CM | POA: Diagnosis not present

## 2023-12-21 DIAGNOSIS — N182 Chronic kidney disease, stage 2 (mild): Secondary | ICD-10-CM | POA: Diagnosis not present

## 2023-12-21 DIAGNOSIS — E1122 Type 2 diabetes mellitus with diabetic chronic kidney disease: Secondary | ICD-10-CM | POA: Diagnosis not present

## 2023-12-21 DIAGNOSIS — I69351 Hemiplegia and hemiparesis following cerebral infarction affecting right dominant side: Secondary | ICD-10-CM | POA: Diagnosis not present

## 2023-12-21 DIAGNOSIS — I872 Venous insufficiency (chronic) (peripheral): Secondary | ICD-10-CM | POA: Diagnosis not present

## 2023-12-21 DIAGNOSIS — I480 Paroxysmal atrial fibrillation: Secondary | ICD-10-CM | POA: Diagnosis not present

## 2023-12-25 DIAGNOSIS — E1122 Type 2 diabetes mellitus with diabetic chronic kidney disease: Secondary | ICD-10-CM | POA: Diagnosis not present

## 2023-12-25 DIAGNOSIS — I872 Venous insufficiency (chronic) (peripheral): Secondary | ICD-10-CM | POA: Diagnosis not present

## 2023-12-25 DIAGNOSIS — I110 Hypertensive heart disease with heart failure: Secondary | ICD-10-CM | POA: Diagnosis not present

## 2023-12-25 DIAGNOSIS — L89621 Pressure ulcer of left heel, stage 1: Secondary | ICD-10-CM | POA: Diagnosis not present

## 2023-12-25 DIAGNOSIS — I5022 Chronic systolic (congestive) heart failure: Secondary | ICD-10-CM | POA: Diagnosis not present

## 2023-12-25 DIAGNOSIS — N182 Chronic kidney disease, stage 2 (mild): Secondary | ICD-10-CM | POA: Diagnosis not present

## 2023-12-26 ENCOUNTER — Other Ambulatory Visit (HOSPITAL_COMMUNITY): Payer: Self-pay | Admitting: Pharmacy Technician

## 2023-12-26 ENCOUNTER — Encounter: Payer: Self-pay | Admitting: Podiatry

## 2023-12-26 ENCOUNTER — Encounter: Payer: Self-pay | Admitting: Nurse Practitioner

## 2023-12-26 ENCOUNTER — Other Ambulatory Visit: Payer: Self-pay

## 2023-12-26 ENCOUNTER — Other Ambulatory Visit (HOSPITAL_COMMUNITY): Payer: Self-pay

## 2023-12-26 ENCOUNTER — Encounter: Payer: Self-pay | Admitting: Neurology

## 2023-12-26 ENCOUNTER — Encounter: Payer: Self-pay | Admitting: Cardiology

## 2023-12-26 NOTE — Progress Notes (Signed)
 Specialty Pharmacy Refill Coordination Note  Earl [name] is a 83 y.o. male contacted today regarding refills of specialty medication(s) Tafamidis  (Vyndamax )  Spoke with Daughter   Patient requested Delivery   Delivery date: 01/04/24   Verified address: Patient address [address]  [place]   Medication will be filled on 01/03/24.

## 2023-12-28 ENCOUNTER — Encounter: Payer: Self-pay | Admitting: Nurse Practitioner

## 2023-12-28 DIAGNOSIS — I872 Venous insufficiency (chronic) (peripheral): Secondary | ICD-10-CM | POA: Diagnosis not present

## 2023-12-28 DIAGNOSIS — N182 Chronic kidney disease, stage 2 (mild): Secondary | ICD-10-CM | POA: Diagnosis not present

## 2023-12-28 DIAGNOSIS — E1122 Type 2 diabetes mellitus with diabetic chronic kidney disease: Secondary | ICD-10-CM | POA: Diagnosis not present

## 2023-12-28 DIAGNOSIS — L89621 Pressure ulcer of left heel, stage 1: Secondary | ICD-10-CM | POA: Diagnosis not present

## 2023-12-28 DIAGNOSIS — I5022 Chronic systolic (congestive) heart failure: Secondary | ICD-10-CM | POA: Diagnosis not present

## 2023-12-28 DIAGNOSIS — I110 Hypertensive heart disease with heart failure: Secondary | ICD-10-CM | POA: Diagnosis not present

## 2024-01-02 ENCOUNTER — Ambulatory Visit: Payer: Medicare Other | Admitting: Nurse Practitioner

## 2024-01-02 DIAGNOSIS — L89621 Pressure ulcer of left heel, stage 1: Secondary | ICD-10-CM | POA: Diagnosis not present

## 2024-01-02 DIAGNOSIS — I5022 Chronic systolic (congestive) heart failure: Secondary | ICD-10-CM | POA: Diagnosis not present

## 2024-01-02 DIAGNOSIS — I110 Hypertensive heart disease with heart failure: Secondary | ICD-10-CM | POA: Diagnosis not present

## 2024-01-02 DIAGNOSIS — N182 Chronic kidney disease, stage 2 (mild): Secondary | ICD-10-CM | POA: Diagnosis not present

## 2024-01-02 DIAGNOSIS — E1122 Type 2 diabetes mellitus with diabetic chronic kidney disease: Secondary | ICD-10-CM | POA: Diagnosis not present

## 2024-01-02 DIAGNOSIS — I872 Venous insufficiency (chronic) (peripheral): Secondary | ICD-10-CM | POA: Diagnosis not present

## 2024-01-03 ENCOUNTER — Telehealth: Payer: Self-pay | Admitting: Cardiology

## 2024-01-03 DIAGNOSIS — H10411 Chronic giant papillary conjunctivitis, right eye: Secondary | ICD-10-CM | POA: Diagnosis not present

## 2024-01-03 NOTE — Telephone Encounter (Signed)
Not able to sched recall [facility] sched is full and [facility] isn't out yet

## 2024-01-04 ENCOUNTER — Encounter: Payer: Self-pay | Admitting: Cardiology

## 2024-01-04 DIAGNOSIS — L89621 Pressure ulcer of left heel, stage 1: Secondary | ICD-10-CM | POA: Diagnosis not present

## 2024-01-04 DIAGNOSIS — I5022 Chronic systolic (congestive) heart failure: Secondary | ICD-10-CM | POA: Diagnosis not present

## 2024-01-04 DIAGNOSIS — E1122 Type 2 diabetes mellitus with diabetic chronic kidney disease: Secondary | ICD-10-CM | POA: Diagnosis not present

## 2024-01-04 DIAGNOSIS — I110 Hypertensive heart disease with heart failure: Secondary | ICD-10-CM | POA: Diagnosis not present

## 2024-01-04 DIAGNOSIS — N182 Chronic kidney disease, stage 2 (mild): Secondary | ICD-10-CM | POA: Diagnosis not present

## 2024-01-04 DIAGNOSIS — I872 Venous insufficiency (chronic) (peripheral): Secondary | ICD-10-CM | POA: Diagnosis not present

## 2024-01-08 DIAGNOSIS — N182 Chronic kidney disease, stage 2 (mild): Secondary | ICD-10-CM | POA: Diagnosis not present

## 2024-01-08 DIAGNOSIS — E1122 Type 2 diabetes mellitus with diabetic chronic kidney disease: Secondary | ICD-10-CM | POA: Diagnosis not present

## 2024-01-08 DIAGNOSIS — I5022 Chronic systolic (congestive) heart failure: Secondary | ICD-10-CM | POA: Diagnosis not present

## 2024-01-08 DIAGNOSIS — I872 Venous insufficiency (chronic) (peripheral): Secondary | ICD-10-CM | POA: Diagnosis not present

## 2024-01-08 DIAGNOSIS — L89621 Pressure ulcer of left heel, stage 1: Secondary | ICD-10-CM | POA: Diagnosis not present

## 2024-01-08 DIAGNOSIS — I110 Hypertensive heart disease with heart failure: Secondary | ICD-10-CM | POA: Diagnosis not present

## 2024-01-10 ENCOUNTER — Ambulatory Visit: Payer: Medicare Other | Admitting: Podiatry

## 2024-01-11 ENCOUNTER — Other Ambulatory Visit (HOSPITAL_COMMUNITY): Payer: Self-pay | Admitting: Cardiology

## 2024-01-11 ENCOUNTER — Other Ambulatory Visit: Payer: Self-pay

## 2024-01-11 DIAGNOSIS — I5022 Chronic systolic (congestive) heart failure: Secondary | ICD-10-CM | POA: Diagnosis not present

## 2024-01-11 DIAGNOSIS — I872 Venous insufficiency (chronic) (peripheral): Secondary | ICD-10-CM | POA: Diagnosis not present

## 2024-01-11 DIAGNOSIS — I110 Hypertensive heart disease with heart failure: Secondary | ICD-10-CM | POA: Diagnosis not present

## 2024-01-11 DIAGNOSIS — E1122 Type 2 diabetes mellitus with diabetic chronic kidney disease: Secondary | ICD-10-CM | POA: Diagnosis not present

## 2024-01-11 DIAGNOSIS — N182 Chronic kidney disease, stage 2 (mild): Secondary | ICD-10-CM | POA: Diagnosis not present

## 2024-01-11 DIAGNOSIS — L89621 Pressure ulcer of left heel, stage 1: Secondary | ICD-10-CM | POA: Diagnosis not present

## 2024-01-11 MED ORDER — ATORVASTATIN CALCIUM 40 MG PO TABS: ORAL_TABLET | ORAL | 0 refills | Status: DC

## 2024-01-12 DIAGNOSIS — E1122 Type 2 diabetes mellitus with diabetic chronic kidney disease: Secondary | ICD-10-CM | POA: Diagnosis not present

## 2024-01-12 DIAGNOSIS — I5022 Chronic systolic (congestive) heart failure: Secondary | ICD-10-CM | POA: Diagnosis not present

## 2024-01-12 DIAGNOSIS — N182 Chronic kidney disease, stage 2 (mild): Secondary | ICD-10-CM | POA: Diagnosis not present

## 2024-01-12 DIAGNOSIS — L89621 Pressure ulcer of left heel, stage 1: Secondary | ICD-10-CM | POA: Diagnosis not present

## 2024-01-12 DIAGNOSIS — I872 Venous insufficiency (chronic) (peripheral): Secondary | ICD-10-CM | POA: Diagnosis not present

## 2024-01-12 DIAGNOSIS — I110 Hypertensive heart disease with heart failure: Secondary | ICD-10-CM | POA: Diagnosis not present

## 2024-01-15 DIAGNOSIS — I5022 Chronic systolic (congestive) heart failure: Secondary | ICD-10-CM | POA: Diagnosis not present

## 2024-01-15 DIAGNOSIS — L89621 Pressure ulcer of left heel, stage 1: Secondary | ICD-10-CM | POA: Diagnosis not present

## 2024-01-15 DIAGNOSIS — E1122 Type 2 diabetes mellitus with diabetic chronic kidney disease: Secondary | ICD-10-CM | POA: Diagnosis not present

## 2024-01-15 DIAGNOSIS — I110 Hypertensive heart disease with heart failure: Secondary | ICD-10-CM | POA: Diagnosis not present

## 2024-01-15 DIAGNOSIS — N182 Chronic kidney disease, stage 2 (mild): Secondary | ICD-10-CM | POA: Diagnosis not present

## 2024-01-15 DIAGNOSIS — I872 Venous insufficiency (chronic) (peripheral): Secondary | ICD-10-CM | POA: Diagnosis not present

## 2024-01-16 ENCOUNTER — Encounter: Payer: Self-pay | Admitting: Neurology

## 2024-01-17 ENCOUNTER — Encounter: Payer: Self-pay | Admitting: Sports Medicine

## 2024-01-17 ENCOUNTER — Ambulatory Visit: Payer: Medicare Other | Admitting: Sports Medicine

## 2024-01-17 ENCOUNTER — Other Ambulatory Visit (HOSPITAL_COMMUNITY): Payer: Self-pay

## 2024-01-17 VITALS — BP 100/62 | HR 83 | Temp 98.0°F | Resp 16 | Ht 71.0 in | Wt 159.0 lb

## 2024-01-17 DIAGNOSIS — R350 Frequency of micturition: Secondary | ICD-10-CM

## 2024-01-17 DIAGNOSIS — I480 Paroxysmal atrial fibrillation: Secondary | ICD-10-CM

## 2024-01-17 DIAGNOSIS — Z9181 History of falling: Secondary | ICD-10-CM

## 2024-01-17 DIAGNOSIS — I5022 Chronic systolic (congestive) heart failure: Secondary | ICD-10-CM

## 2024-01-17 DIAGNOSIS — N401 Enlarged prostate with lower urinary tract symptoms: Secondary | ICD-10-CM | POA: Diagnosis not present

## 2024-01-17 DIAGNOSIS — I10 Essential (primary) hypertension: Secondary | ICD-10-CM

## 2024-01-17 DIAGNOSIS — D472 Monoclonal gammopathy: Secondary | ICD-10-CM

## 2024-01-17 DIAGNOSIS — S81802S Unspecified open wound, left lower leg, sequela: Secondary | ICD-10-CM | POA: Diagnosis not present

## 2024-01-17 DIAGNOSIS — E119 Type 2 diabetes mellitus without complications: Secondary | ICD-10-CM

## 2024-01-17 DIAGNOSIS — E039 Hypothyroidism, unspecified: Secondary | ICD-10-CM | POA: Diagnosis not present

## 2024-01-17 DIAGNOSIS — I6930 Unspecified sequelae of cerebral infarction: Secondary | ICD-10-CM | POA: Diagnosis not present

## 2024-01-17 DIAGNOSIS — R2689 Other abnormalities of gait and mobility: Secondary | ICD-10-CM | POA: Diagnosis not present

## 2024-01-17 DIAGNOSIS — E1122 Type 2 diabetes mellitus with diabetic chronic kidney disease: Secondary | ICD-10-CM

## 2024-01-17 NOTE — Progress Notes (Signed)
 Careteam: Patient Care Team: [name], MD as PCP - General (Internal Medicine) Duke Salvia, MD as PCP - Cardiology (Cardiology) Duke Salvia, MD as PCP - Electrophysiology (Cardiology) Laurey Morale, MD as PCP - Advanced Heart Failure (Cardiology) Louis Meckel, MD (Inactive) as Consulting Physician (Gastroenterology) Benjiman Core, MD (Inactive) as Consulting Physician (Oncology) Elise Benne, MD as Consulting Physician (Ophthalmology) Charlett Nose, [facility] (Inactive) as Pharmacist (Pharmacist) [name], DO as Consulting Physician (Neurology) Carmelina Dane, [facility] (Inactive) as Pharmacist (Pharmacist)  PLACE OF SERVICE:  [facility]  Advanced Directive information Does Patient Have a Medical Advance Directive?: Yes, Type of Advance Directive: Healthcare Power of [place];Living will, Does patient want to make changes to medical advance directive?: No - Patient declined  Allergies  Allergen Reactions   Penicillins Other (See Comments)    Did it involve swelling of the face/tongue/throat, SOB, or low BP? Unknown Did it involve sudden or severe rash/hives, skin peeling, or any reaction on the inside of your mouth or nose? Unknown Did you need to seek medical attention at a hospital or doctor's office? No When did it last happen?    Over 16 Years Ago   If all above answers are "NO", may proceed with cephalosporin use.      Chief Complaint  Patient presents with   Establish Care    New patient.     Discussed the use of AI scribe software for clinical note transcription with the patient, who gave verbal consent to proceed.  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 He is accompanied by his daughter, who is his primary caregiver.  Pt lives alone, had aid for 4 days a week and her daughter visit him remaining days. Daughter lives in [place] and  comes to visit him 3 days a week.   Daughter reports that pt has chronic wound on his left ankle , he has home health nurse who comes twice a week to do dressing change.  He is experiencing issues with wound care and physical therapy services. He receives home health services from [place] for physical therapy and wound care on his leg. His daughter is concerned about the impending end of physical therapy services .    He has a history of falls, with the most recent fall leading to a hospital visit. He uses a walker for mobility within his single-story home. 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 He receives treatment with Imbutra for his neuropathy and heart failure. He also takes baclofen for muscle spasms.  He has heart failure, follows with cardiology, but reports no changes in breathing, chest pain, or palpitations. He can lay flat without difficulty and does not experience nocturnal dyspnea. Recent labs from December show normal kidney function and electrolytes, but he is slightly anemic. No blood in stool or urine, and his mood is stable without depression or anxiety.  He manages diabetes with metformin and   glipizide, but his A1c is well-controlled at 6.8. He checks his blood sugar regularly.    He is slightly anemic, but his levels have been stable.      Review of Systems:  Review of Systems  Constitutional:  Negative for fever.  HENT:  Negative for congestion and sore throat.   Eyes:  Negative for double vision.  Respiratory:  Negative for cough, sputum production and shortness of breath.   Cardiovascular:  Negative for chest pain and palpitations.  Gastrointestinal:  Negative for abdominal pain, heartburn and nausea.  Genitourinary:  Negative for dysuria, frequency and hematuria.  Musculoskeletal:  Positive for falls.  Neurological:  Negative for dizziness.   Negative unless  indicated in HPI.   Past Medical History:  Diagnosis Date   Adenomatous colon polyp    Allergy    Anal fissure    Arthritis    BPH (benign prostatic hyperplasia)    Diabetes mellitus (HCC)    GERD (gastroesophageal reflux disease)    Hyperlipidemia    Hypertension    Hypothyroidism    Other testicular hypofunction    Stroke [facility])    Past Surgical History:  Procedure Laterality Date   CIRCUMCISION N/A 06/11/2019   Procedure: CIRCUMCISION ADULT;  Surgeon: Jerilee Field, MD;  Location: [facility] ORS;  Service: Urology;  Laterality: N/A;   COLONOSCOPY     INGUINAL HERNIA REPAIR Bilateral    1968 and 2004   LOOP RECORDER INSERTION N/A 07/28/2017   Procedure: LOOP RECORDER INSERTION;  Surgeon: Duke Salvia, MD;  Location: [facility] INVASIVE CV LAB;  Service: Cardiovascular;  Laterality: N/A;   RIGHT/LEFT HEART CATH AND CORONARY ANGIOGRAPHY N/A 10/07/2021   Procedure: RIGHT/LEFT HEART CATH AND CORONARY ANGIOGRAPHY;  Surgeon: Laurey Morale, MD;  Location: [facility] INVASIVE CV LAB;  Service: Cardiovascular;  Laterality: N/A;   TEE WITHOUT CARDIOVERSION N/A 07/28/2017   Procedure: TRANSESOPHAGEAL ECHOCARDIOGRAM (TEE);  Surgeon: Quintella Reichert, MD;  Location: [facility] ENDOSCOPY;  Service: Cardiovascular;  Laterality: N/A;   Social History:   reports that he has never smoked. He has never used smokeless tobacco. He reports that he does not drink alcohol and does not use drugs.  Family History  Problem Relation Age of Onset   Sickle cell anemia Mother    Diabetes Father    Stomach cancer Father    Colon cancer Father    CAD Neg Hx    Esophageal cancer Neg Hx    Rectal cancer Neg Hx     Medications: Patient's Medications  New Prescriptions   No medications on file  Previous Medications   AMVUTTRA 25 MG/0.5ML SYRINGE    INJECT 25MG  (0.5ML) SUBCUTANEOUSLY ONCE EVERY 3 MONTHS. ROTATE ADMINISTRATION SITE WITH EACH INJECTION. *REFRIGERATE. DO NOT SHAKE.*   ATORVASTATIN (LIPITOR) 40 MG TABLET    Take  1  tablet  Daily for Cholesterol                                                       /                                  TAKE                                                       BY  MOUTH   BACLOFEN 5 MG TABS    Take 0.5 tablets (2.5 mg total) by mouth 2 (two) times daily.   BLOOD GLUCOSE MONITORING SUPPL (ONE TOUCH ULTRA 2) W/DEVICE KIT    Check blood sugar 1 time a day-DX-E11.9   CHOLECALCIFEROL (VITAMIN D PO)    Take 1 tablet by mouth daily.    CLOTRIMAZOLE-BETAMETHASONE (LOTRISONE) CREAM    Apply to affected area 2 to 4 x / day   CYANOCOBALAMIN (B-12) 50 MCG TABS    Take 50 mcg by mouth daily.   ELIQUIS 5 MG TABS TABLET    TAKE 1 TABLET BY MOUTH TWICE  DAILY TO PREVENT BLOOD CLOTS   EMOLLIENT (AQUAPHOR ADV PROTECT HEALING) OINT    Apply small amount to two areas on buttocks daily to prevent breakdown   FERROUS SULFATE 325 (65 FE) MG TABLET    Take 325 mg by mouth at bedtime.   FINASTERIDE (PROSCAR) 5 MG TABLET    TAKE 1 TABLET BY MOUTH DAILY FOR PROSTATE   GLUCOSE BLOOD (ONETOUCH ULTRA) TEST STRIP    Use as instructed   LANCETS (ONETOUCH DELICA PLUS LANCET30G) MISC    CHECK BLOOD SUGAR ONCE A DAY.   LANCETS MISC. (ONE TOUCH SURESOFT) MISC    Check blood sugar 1 time a day-DX-E11.91   LEVOTHYROXINE (SYNTHROID) 50 MCG TABLET    TAKE 1 TABLET BY MOUTH DAILY ON  AN EMPTY STOMACH WITH ONLY WATER FOR 30 MINUTES AND NO ANTACID,  MEDS, CALCIUM OR MAGNESIUM FOR 4 HOURS AND AVOID BIOTIN   MAGNESIUM 250 MG TABS    Take 250 mg by mouth daily.    METFORMIN (GLUCOPHAGE-XR) 500 MG 24 HR TABLET    TAKE 2 TABLETS BY MOUTH TWICE  DAILY WITH MEALS FOR DIABETES   METOPROLOL SUCCINATE (TOPROL-XL) 50 MG 24 HR TABLET    Take 1 tablet (50 mg total) by mouth at bedtime. NEEDS FOLLOW UP APPOINTMENT FOR MORE REFILLS   MULTIPLE VITAMIN (MULTIVITAMIN) CAPSULE    Take 1 capsule by mouth daily.   NONFORMULARY OR COMPOUNDED ITEM    Please dispense a Rolling Walker.  Dx:R26.81 & G63    POTASSIUM CHLORIDE SA (KLOR-CON M20) 20 MEQ TABLET    Take 1 tablet by mouth once daily   SACUBITRIL-VALSARTAN (ENTRESTO) 24-26 MG    Take 1 tablet by mouth 2 (two) times daily.   SPIRONOLACTONE (ALDACTONE) 25 MG TABLET    TAKE 1 TABLET BY MOUTH AT BEDTIME   TAFAMIDIS (VYNDAMAX) 61 MG CAPS    Take 1 capsule by mouth daily.   TORSEMIDE (DEMADEX) 20 MG TABLET    Take  1 tablet   2 x /day  for Fluid Retention & Leg / Ankle Swelling   TRIAMCINOLONE CREAM (KENALOG) 0.1 %    Apply 1 Application topically as needed.   TROLAMINE SALICYLATE (ASPERCREME) 10 % CREAM    Apply 1 application topically daily as needed for muscle pain.   VITAMIN A PO    Take by mouth. 3000 mg   VITAMIN C (ASCORBIC ACID) 500 MG TABLET    Take 500 mg by mouth daily.  Modified Medications   No medications on file  Discontinued Medications   CONTINUOUS GLUCOSE RECEIVER (FREESTYLE LIBRE 3 READER) DEVI    Use with freestyle libre sensor   CONTINUOUS GLUCOSE SENSOR (FREESTYLE LIBRE 3 SENSOR) MISC    Place 1 sensor on the skin every 14 days. Use to check glucose continuously   GLIPIZIDE (GLUCOTROL) 5 MG  TABLET    Take 1/2 to 1 tablet 3 x / day with Meals for Diabetes   GLIPIZIDE (GLUCOTROL) 5 MG TABLET    Take 5 mg by mouth 3 (three) times a week.    Physical Exam: Vitals:   01/17/24 1426  BP: 100/62  Pulse: 83  Resp: 16  Temp: 98 F (36.7 C)  SpO2: 97%  Weight: 159 lb (72.1 kg)  Height: 5\' 11"  (1.803 m)   Body mass index is 22.18 kg/m. BP Readings from Last 3 Encounters:  01/17/24 100/62  11/23/23 (!) 100/54  11/23/23 102/62   Wt Readings from Last 3 Encounters:  01/17/24 159 lb (72.1 kg)  11/23/23 162 lb (73.5 kg)  11/16/23 151 lb 6.4 oz (68.7 kg)    Physical Exam Constitutional:      Appearance: Normal appearance.  HENT:     Head: Normocephalic and atraumatic.  Cardiovascular:     Rate and Rhythm: Normal rate and regular rhythm.     Pulses: Normal pulses.     Heart sounds: Normal heart sounds.   Pulmonary:     Effort: No respiratory distress.     Breath sounds: No stridor. No wheezing or rales.  Abdominal:     General: Bowel sounds are normal. There is no distension.     Palpations: Abdomen is soft.     Tenderness: There is no abdominal tenderness. There is no right CVA tenderness or guarding.  Musculoskeletal:        General: No swelling.  Skin:    Comments: Superficial 1cm open wound near left ankle Scabbed No tenderness or drainage noted.  Neurological:     Mental Status: He is alert. Mental status is at baseline.     Sensory: No sensory deficit.     Motor: No weakness.     Labs reviewed: Basic Metabolic Panel: Recent Labs    03/21/23 1612 07/19/23 1221 08/23/23 1541 10/31/23 1601 11/14/23 1635 11/14/23 1750 11/15/23 0307  NA 140 141   < > 140 142 144 139  K 5.1 4.6   < > 4.8 4.1 3.6 4.0  CL 105 107   < > 105 108  --  108  CO2 28 25   < > 26 25  --  25  GLUCOSE 97 147*   < > 78 203*  --  183*  BUN 32* 25   < > 27* 20  --  13  CREATININE 0.96 1.06   < > 1.00 0.98  --  0.94  CALCIUM 9.4 9.0   < > 9.4 9.1  --  8.6*  MG 2.0  --   --  1.9  --   --   --   TSH 0.27* 0.86  --  0.46  --   --  0.599   < > = values in this interval not displayed.   Liver Function Tests: Recent Labs    10/31/23 1601 11/14/23 1635 11/15/23 0307  AST 19 40 33  ALT 18 33 30  ALKPHOS  --  59 53  BILITOT 0.4 0.5 0.8  PROT 6.2 5.6* 5.0*  ALBUMIN  --  3.1* 2.7*   No results for input(s): "LIPASE", "AMYLASE" in the last 8760 hours. Recent Labs    11/15/23 0307  AMMONIA 32   CBC: Recent Labs    07/19/23 1221 10/31/23 1601 11/14/23 1635 11/14/23 1750 11/15/23 0307  WBC 5.1 4.7 7.1  --  5.1  NEUTROABS 2,851 2,359 5.2  --   --  HGB 11.6* 11.4* 11.1* 8.8* 10.1*  HCT 34.7* 33.7* 33.1* 26.0* 30.1*  MCV 90.1 88.0 89.2  --  88.3  PLT 226 226 207  --  197   Lipid Panel: Recent Labs    03/21/23 1612 07/19/23 1221 10/31/23 1601  CHOL 108 103 120  HDL 53 52 55   LDLCALC 42 39 52  TRIG 46 42 58  CHOLHDL 2.0 2.0 2.2   TSH: Recent Labs    07/19/23 1221 10/31/23 1601 11/15/23 0307  TSH 0.86 0.46 0.599   A1C: Lab Results  Component Value Date   HGBA1C 6.8 (H) 10/31/2023    Assessment and Plan         1. Non-insulin dependent type 2 diabetes mellitus (HCC) (Primary) A1c of 6.8 in December,    Currently on Glipizide and Metformin. -Discontinue Glipizide due to risk of hypoglycemia   -Continue Metformin.  informed patient daughter that insurance would not cover for CGM as he is not on insulin  2. Benign prostatic hyperplasia with urinary frequency Cont with finasteride  3. Essential hypertension At goal  Cont with the same  4. Paroxysmal atrial fibrillation (HCC) Rate controlled No signs of bleeding  5. Hypothyroidism, unspecified type Will check TSH  Cont with synthyroid  6. MGUS (monoclonal gammopathy of unknown significance)  - Ambulatory referral to Home Health  7. History of CVA with residual deficit  Cont with lipitor  8. Chronic systolic heart failure (HCC)   lungs clear chronic lower extremity swelling  -Continue current medications including Torsemide, Spironolactone, Entresto, Potassium, Metoprolol, and Lipitor. follow up with cardiology  - Ambulatory referral to Home Health  9. Wound of left lower extremity, sequela Chronic wound Superficial wound on left ankle instructed to use medihoney and kerlex wrap dressing change done today  10. At risk for falls 11. Balance problems Neuropathy Difficulty walking and unsteadiness reported. Currently receiving home physical therapy. -Continue home physical therapy.  will order home health     Return in about 3 months (around 04/15/2024).:

## 2024-01-18 DIAGNOSIS — E1122 Type 2 diabetes mellitus with diabetic chronic kidney disease: Secondary | ICD-10-CM | POA: Diagnosis not present

## 2024-01-18 DIAGNOSIS — I5022 Chronic systolic (congestive) heart failure: Secondary | ICD-10-CM | POA: Diagnosis not present

## 2024-01-18 DIAGNOSIS — I110 Hypertensive heart disease with heart failure: Secondary | ICD-10-CM | POA: Diagnosis not present

## 2024-01-18 DIAGNOSIS — L89621 Pressure ulcer of left heel, stage 1: Secondary | ICD-10-CM | POA: Diagnosis not present

## 2024-01-18 DIAGNOSIS — I872 Venous insufficiency (chronic) (peripheral): Secondary | ICD-10-CM | POA: Diagnosis not present

## 2024-01-18 DIAGNOSIS — N182 Chronic kidney disease, stage 2 (mild): Secondary | ICD-10-CM | POA: Diagnosis not present

## 2024-01-19 ENCOUNTER — Telehealth: Payer: Self-pay | Admitting: Neurology

## 2024-01-19 NOTE — Telephone Encounter (Signed)
 Pt rqst orders to move discharge to next week 01/23/24, per pt rqst. Will miss todays visit, please call Liji back

## 2024-01-19 NOTE — Telephone Encounter (Signed)
 Para wants them to continue PT.  Is it possible to extend PT for gait training?

## 2024-01-22 NOTE — Telephone Encounter (Signed)
 Agree

## 2024-01-22 NOTE — Telephone Encounter (Signed)
 Called Liji and left a message for a call back.

## 2024-01-22 NOTE — Telephone Encounter (Signed)
 Called and spoke to Liji and she needs a verbal ok to discontinue therapy as patient is now at a maintenance level and there is nothing more they can do for patient. Verbal ok given to Liji to discontinue therapy.   Ligi stated that there are two options for patient:   Outpatient- more therapy options- community activities (with wound care he cannot go to outpatient until wound care discharges him due to conflict with insurance for both services) -home health and outpatient cannot work on patient at the same time.   2.   In-patient facility-stay at rehab. Daily therapy.   Ligi stated patients family is not understanding that insurance is very strict and is only going to cover a certain amount of visits and even with a doctor approval the insurance companies have the final say so. At this point patient is always going to be a fall risk and patient is needing maintenance care at this point.

## 2024-01-24 ENCOUNTER — Ambulatory Visit (INDEPENDENT_AMBULATORY_CARE_PROVIDER_SITE_OTHER): Payer: Medicare Other | Admitting: Podiatry

## 2024-01-24 ENCOUNTER — Encounter: Payer: Self-pay | Admitting: Podiatry

## 2024-01-24 ENCOUNTER — Other Ambulatory Visit (HOSPITAL_COMMUNITY): Payer: Self-pay

## 2024-01-24 VITALS — Ht 71.0 in | Wt 159.0 lb

## 2024-01-24 DIAGNOSIS — M79676 Pain in unspecified toe(s): Secondary | ICD-10-CM

## 2024-01-24 DIAGNOSIS — E1142 Type 2 diabetes mellitus with diabetic polyneuropathy: Secondary | ICD-10-CM

## 2024-01-24 DIAGNOSIS — B351 Tinea unguium: Secondary | ICD-10-CM | POA: Diagnosis not present

## 2024-01-24 DIAGNOSIS — L84 Corns and callosities: Secondary | ICD-10-CM | POA: Diagnosis not present

## 2024-01-26 ENCOUNTER — Other Ambulatory Visit (HOSPITAL_COMMUNITY): Payer: Self-pay

## 2024-01-28 NOTE — Progress Notes (Signed)
 Subjective:  Patient ID: Earl [name], male    DOB: 1941/07/14,  MRN: [id number]  83 y.o. male presents at risk foot care with history of diabetic neuropathy and preulcerative lesion(s) left great toe and painful mycotic toenails that limit ambulation. Painful toenails interfere with ambulation. Aggravating factors include wearing enclosed shoe gear. Pain is relieved with periodic professional debridement. Painful preulcerative lesion(s) is/are aggravated when weightbearing with and without shoegear. Pain is relieved with periodic professional debridement.  Chief Complaint  Patient presents with   Nail Problem    Pt is here for [facility]  last A1C was 6.8 PCP is Dr Jacquenette Shone and LOV was in February.   New problem(s): None   PCP is [name], MD.  Allergies  Allergen Reactions   Penicillins Other (See Comments)    Did it involve swelling of the face/tongue/throat, SOB, or low BP? Unknown Did it involve sudden or severe rash/hives, skin peeling, or any reaction on the inside of your mouth or nose? Unknown Did you need to seek medical attention at a hospital or doctor's office? No When did it last happen?    Over 48 Years Ago   If all above answers are "NO", may proceed with cephalosporin use.      Review of Systems: Negative except as noted in the HPI.   Objective:  Earl [name] is a pleasant 83 y.o. male WD, WN in NAD. AAO x 3.  Vascular Examination: Vascular status intact b/l with palpable pedal pulses. CFT immediate b/l. Pedal hair present. No edema. No pain with calf compression b/l. Skin temperature gradient WNL b/l. No varicosities noted. No cyanosis or clubbing noted.  Neurological Examination: Sensation grossly intact b/l with 10 gram monofilament. Vibratory sensation intact b/l.  Dermatological Examination: Pedal skin with normal turgor, texture and tone b/l. No open wounds nor interdigital macerations noted. Toenails 1-5 b/l thick, discolored, elongated  with subungual debris and pain on dorsal palpation. Preulcerative lesion noted distal tip of left hallux with subdermal hemorrhage No erythema, no edema, no drainage, no fluctuance.  Musculoskeletal Examination: Muscle strength 5/5 to b/l LE.  No pain, crepitus noted b/l. No gross pedal deformities. Patient ambulates independently without assistive aids.   Radiographs: None  Last A1c:      Latest Ref Rng & Units 10/31/2023    4:01 PM 07/19/2023   12:21 PM 03/21/2023    4:12 PM  Hemoglobin A1C  Hemoglobin-A1c <5.7 % of total Hgb 6.8  6.4  6.3      Assessment:   1. Pain due to onychomycosis of toenail   2. Pre-ulcerative calluses   3. Diabetic polyneuropathy associated with type 2 diabetes mellitus (HCC)    Plan:  -Consent given for treatment as described below: -Examined patient. -Continue foot and shoe inspections daily. Monitor blood glucose per PCP/Endocrinologist's recommendations. -Patient to continue soft, supportive shoe gear daily. -Toenails 1-5 b/l were debrided in length and girth with sterile nail nippers and dremel without iatrogenic bleeding.  -Preulcerative lesion pared left great toe utilizing sterile scalpel blade. Total number pared=1. -Patient/POA to call should there be question/concern in the interim.  Return in about 3 months (around 04/25/2024).  [name], DPM      [facility] LOCATION: [address]  [place], [place] [address]                   Office [phone]   [facility] LOCATION: [address] [place], [place] [address] Office [phone]

## 2024-01-29 ENCOUNTER — Other Ambulatory Visit: Payer: Self-pay

## 2024-01-29 ENCOUNTER — Encounter (HOSPITAL_COMMUNITY): Payer: Self-pay | Admitting: Cardiology

## 2024-01-29 NOTE — Progress Notes (Signed)
 Specialty Pharmacy Refill Coordination Note  Earl [name] is a 83 y.o. male contacted today regarding refills of specialty medication(s) Tafamidis (Vyndamax)   Spoke with patient's daughter - Para  Patient requested Delivery   Delivery date: 02/01/24   Verified address: Patient address [address]  [place]   Medication will be filled on 03.12.25.

## 2024-01-31 ENCOUNTER — Other Ambulatory Visit (HOSPITAL_COMMUNITY): Payer: Self-pay

## 2024-01-31 MED ORDER — ENTRESTO 24-26 MG PO TABS: 1.0000 | ORAL_TABLET | Freq: Two times a day (BID) | ORAL | 3 refills | Status: DC

## 2024-01-31 NOTE — Telephone Encounter (Signed)
 I have spoken with pts daughter and requested to have a new rx sent in to [facility] to be picked up today

## 2024-02-14 ENCOUNTER — Emergency Department (HOSPITAL_COMMUNITY)

## 2024-02-14 ENCOUNTER — Other Ambulatory Visit: Payer: Self-pay

## 2024-02-14 ENCOUNTER — Encounter (HOSPITAL_COMMUNITY): Payer: Self-pay | Admitting: Emergency Medicine

## 2024-02-14 ENCOUNTER — Inpatient Hospital Stay (HOSPITAL_COMMUNITY)
Admission: EM | Admit: 2024-02-14 | Discharge: 2024-02-29 | DRG: 177 | Disposition: A | Discharge status: Skilled Nursing Facility | Attending: Family Medicine | Admitting: Family Medicine

## 2024-02-14 DIAGNOSIS — L89896 Pressure-induced deep tissue damage of other site: Secondary | ICD-10-CM | POA: Clinically undetermined

## 2024-02-14 DIAGNOSIS — I2510 Atherosclerotic heart disease of native coronary artery without angina pectoris: Secondary | ICD-10-CM | POA: Diagnosis present

## 2024-02-14 DIAGNOSIS — U071 COVID-19: Principal | ICD-10-CM | POA: Diagnosis present

## 2024-02-14 DIAGNOSIS — Z833 Family history of diabetes mellitus: Secondary | ICD-10-CM

## 2024-02-14 DIAGNOSIS — Y92239 Unspecified place in hospital as the place of occurrence of the external cause: Secondary | ICD-10-CM | POA: Diagnosis not present

## 2024-02-14 DIAGNOSIS — Z794 Long term (current) use of insulin: Secondary | ICD-10-CM

## 2024-02-14 DIAGNOSIS — N400 Enlarged prostate without lower urinary tract symptoms: Secondary | ICD-10-CM | POA: Diagnosis present

## 2024-02-14 DIAGNOSIS — L89616 Pressure-induced deep tissue damage of right heel: Secondary | ICD-10-CM | POA: Clinically undetermined

## 2024-02-14 DIAGNOSIS — Z515 Encounter for palliative care: Secondary | ICD-10-CM

## 2024-02-14 DIAGNOSIS — L89150 Pressure ulcer of sacral region, unstageable: Secondary | ICD-10-CM | POA: Diagnosis not present

## 2024-02-14 DIAGNOSIS — E440 Moderate protein-calorie malnutrition: Secondary | ICD-10-CM | POA: Insufficient documentation

## 2024-02-14 DIAGNOSIS — R748 Abnormal levels of other serum enzymes: Secondary | ICD-10-CM | POA: Diagnosis present

## 2024-02-14 DIAGNOSIS — I110 Hypertensive heart disease with heart failure: Secondary | ICD-10-CM | POA: Diagnosis present

## 2024-02-14 DIAGNOSIS — M6282 Rhabdomyolysis: Secondary | ICD-10-CM | POA: Diagnosis present

## 2024-02-14 DIAGNOSIS — Z832 Family history of diseases of the blood and blood-forming organs and certain disorders involving the immune mechanism: Secondary | ICD-10-CM

## 2024-02-14 DIAGNOSIS — D61818 Other pancytopenia: Secondary | ICD-10-CM | POA: Diagnosis present

## 2024-02-14 DIAGNOSIS — Z7984 Long term (current) use of oral hypoglycemic drugs: Secondary | ICD-10-CM

## 2024-02-14 DIAGNOSIS — W19XXXA Unspecified fall, initial encounter: Secondary | ICD-10-CM | POA: Diagnosis present

## 2024-02-14 DIAGNOSIS — Z79899 Other long term (current) drug therapy: Secondary | ICD-10-CM

## 2024-02-14 DIAGNOSIS — I480 Paroxysmal atrial fibrillation: Secondary | ICD-10-CM | POA: Diagnosis present

## 2024-02-14 DIAGNOSIS — Z7901 Long term (current) use of anticoagulants: Secondary | ICD-10-CM

## 2024-02-14 DIAGNOSIS — I69351 Hemiplegia and hemiparesis following cerebral infarction affecting right dominant side: Secondary | ICD-10-CM

## 2024-02-14 DIAGNOSIS — I428 Other cardiomyopathies: Secondary | ICD-10-CM | POA: Diagnosis present

## 2024-02-14 DIAGNOSIS — E1141 Type 2 diabetes mellitus with diabetic mononeuropathy: Secondary | ICD-10-CM | POA: Diagnosis present

## 2024-02-14 DIAGNOSIS — G928 Other toxic encephalopathy: Secondary | ICD-10-CM | POA: Diagnosis not present

## 2024-02-14 DIAGNOSIS — E854 Organ-limited amyloidosis: Secondary | ICD-10-CM

## 2024-02-14 DIAGNOSIS — D472 Monoclonal gammopathy: Secondary | ICD-10-CM | POA: Diagnosis present

## 2024-02-14 DIAGNOSIS — Z66 Do not resuscitate: Secondary | ICD-10-CM | POA: Diagnosis present

## 2024-02-14 DIAGNOSIS — E119 Type 2 diabetes mellitus without complications: Secondary | ICD-10-CM

## 2024-02-14 DIAGNOSIS — I10 Essential (primary) hypertension: Secondary | ICD-10-CM | POA: Diagnosis present

## 2024-02-14 DIAGNOSIS — I5022 Chronic systolic (congestive) heart failure: Secondary | ICD-10-CM | POA: Diagnosis present

## 2024-02-14 DIAGNOSIS — Z8673 Personal history of transient ischemic attack (TIA), and cerebral infarction without residual deficits: Secondary | ICD-10-CM

## 2024-02-14 DIAGNOSIS — E11649 Type 2 diabetes mellitus with hypoglycemia without coma: Secondary | ICD-10-CM | POA: Diagnosis not present

## 2024-02-14 DIAGNOSIS — Z6822 Body mass index (BMI) 22.0-22.9, adult: Secondary | ICD-10-CM

## 2024-02-14 DIAGNOSIS — T428X5A Adverse effect of antiparkinsonism drugs and other central muscle-tone depressants, initial encounter: Secondary | ICD-10-CM | POA: Diagnosis not present

## 2024-02-14 DIAGNOSIS — Z79890 Hormone replacement therapy (postmenopausal): Secondary | ICD-10-CM

## 2024-02-14 DIAGNOSIS — E875 Hyperkalemia: Secondary | ICD-10-CM | POA: Diagnosis present

## 2024-02-14 DIAGNOSIS — R55 Syncope and collapse: Secondary | ICD-10-CM | POA: Insufficient documentation

## 2024-02-14 DIAGNOSIS — R531 Weakness: Secondary | ICD-10-CM | POA: Diagnosis not present

## 2024-02-14 DIAGNOSIS — I951 Orthostatic hypotension: Secondary | ICD-10-CM | POA: Diagnosis present

## 2024-02-14 DIAGNOSIS — E785 Hyperlipidemia, unspecified: Secondary | ICD-10-CM | POA: Diagnosis present

## 2024-02-14 DIAGNOSIS — E039 Hypothyroidism, unspecified: Secondary | ICD-10-CM | POA: Diagnosis present

## 2024-02-14 DIAGNOSIS — I43 Cardiomyopathy in diseases classified elsewhere: Secondary | ICD-10-CM | POA: Diagnosis present

## 2024-02-14 DIAGNOSIS — Z800 Family history of malignant neoplasm of digestive organs: Secondary | ICD-10-CM

## 2024-02-14 DIAGNOSIS — Z860101 Personal history of adenomatous and serrated colon polyps: Secondary | ICD-10-CM

## 2024-02-14 DIAGNOSIS — Y92009 Unspecified place in unspecified non-institutional (private) residence as the place of occurrence of the external cause: Secondary | ICD-10-CM

## 2024-02-14 DIAGNOSIS — Z880 Allergy status to penicillin: Secondary | ICD-10-CM

## 2024-02-14 DIAGNOSIS — G629 Polyneuropathy, unspecified: Secondary | ICD-10-CM | POA: Diagnosis present

## 2024-02-14 DIAGNOSIS — R509 Fever, unspecified: Secondary | ICD-10-CM | POA: Diagnosis not present

## 2024-02-14 LAB — CBC
HCT: 35.1 % — ABNORMAL LOW (ref 39.0–52.0)
Hemoglobin: 11.9 g/dL — ABNORMAL LOW (ref 13.0–17.0)
MCH: 30 pg (ref 26.0–34.0)
MCHC: 33.9 g/dL (ref 30.0–36.0)
MCV: 88.4 fL (ref 80.0–100.0)
Platelets: 149 10*3/uL — ABNORMAL LOW (ref 150–400)
RBC: 3.97 MIL/uL — ABNORMAL LOW (ref 4.22–5.81)
RDW: 14.1 % (ref 11.5–15.5)
WBC: 5.3 10*3/uL (ref 4.0–10.5)
nRBC: 0 % (ref 0.0–0.2)

## 2024-02-14 LAB — URINALYSIS, ROUTINE W REFLEX MICROSCOPIC
Bilirubin Urine: NEGATIVE
Glucose, UA: NEGATIVE mg/dL
Hgb urine dipstick: NEGATIVE
Ketones, ur: 5 mg/dL — AB
Leukocytes,Ua: NEGATIVE
Nitrite: NEGATIVE
Protein, ur: NEGATIVE mg/dL
Specific Gravity, Urine: 1.011 (ref 1.005–1.030)
pH: 6 (ref 5.0–8.0)

## 2024-02-14 LAB — CBG MONITORING, ED: Glucose-Capillary: 285 mg/dL — ABNORMAL HIGH (ref 70–99)

## 2024-02-14 LAB — RESP PANEL BY RT-PCR (RSV, FLU A&B, COVID)  RVPGX2
Influenza A by PCR: NEGATIVE
Influenza B by PCR: NEGATIVE
Resp Syncytial Virus by PCR: NEGATIVE
SARS Coronavirus 2 by RT PCR: POSITIVE — AB

## 2024-02-14 NOTE — ED Triage Notes (Addendum)
 Pt in from home via [facility] with overall weakness, EMS was called by daughter for concern of pt "leaning to the L side all day". Daughter also states the pt fell today but states it was not a significant fall. Pt has hx of CVA with R side deficits, is otherwise at baseline other than the leaning that daughter has mentioned. Hx of Afib chronically, was rate controlled in 80-100's during transport  VS enroute: 114/76 80-100AFib CBG 360

## 2024-02-14 NOTE — ED Notes (Signed)
 Pt transported to MRI at this time

## 2024-02-14 NOTE — ED Notes (Signed)
 Pt attempting to provide urine sample at this time

## 2024-02-14 NOTE — ED Provider Triage Note (Signed)
 Emergency Medicine Provider Triage Evaluation Note  Earl [name] , a 83 y.o. male  was evaluated in triage.  Pt complains of generalized weakness, new leaning to the left. Hx of CVA and normally right sided deficits. On afib chronically. Lnw yesterday..  Review of Systems  Positive: Weakness Negative: Confusion, numbness  Physical Exam  BP 116/70 (BP Location: Right Arm)   Pulse 91   Temp 99.8 F (37.7 C) (Oral)   Resp 16   Wt 72.1 kg   SpO2 97%   BMI 22.18 kg/m  Gen:   Awake, no distress   Resp:  Normal effort  MSK:   Moves extremities without difficulty  Other:  Concern for left greater than right weakness, globally weak throughout. Aox3, following commands spontaneously. No facial droop  Medical Decision Making  Medically screening exam initiated at 7:59 PM.  Appropriate orders placed.  Earl [name] was informed that the remainder of the evaluation will be completed by another provider, this initial triage assessment does not replace that evaluation, and the importance of remaining in the ED until their evaluation is complete.  Workup initiated in triage    Earl [name] 02/14/24 2002

## 2024-02-14 NOTE — ED Provider Notes (Signed)
 [place] EMERGENCY DEPARTMENT AT [facility] Provider Note   CSN: [id number] Arrival date & time: 02/14/24  1947     History {Add pertinent medical, surgical, social history, OB history to HPI:1} Chief Complaint  Patient presents with   Weakness   Fever    Earl [name] is a 83 y.o. male.  83 year old male with a history of stroke with residual right-sided weakness, diabetes, hypertension, A-fib on Eliquis, hypothyroidism, MGUS, and CHF who presents emergency department after a fall.  History obtained predominantly from the patient's daughter.  Reports that he was in his usual state of health at 11:50 AM today.  She left to go to work and returned and he was found on the bathroom floor at 5 PM.  She think she was confused.  Thinks he was leaning to his right side as well.  No recent illnesses.  Denies any pain as result of the fall.  Thinks he may have been somewhat confused afterwards.       Home Medications Prior to Admission medications   Medication Sig Start Date End Date Taking? Authorizing Provider  AMVUTTRA 25 MG/0.5ML syringe INJECT 25MG  (0.5ML) SUBCUTANEOUSLY ONCE EVERY 3 MONTHS. ROTATE ADMINISTRATION SITE WITH EACH INJECTION. *REFRIGERATE. DO NOT SHAKE.* 10/24/23   Laurey Morale, MD  atorvastatin (LIPITOR) 40 MG tablet Take  1 tablet  Daily for Cholesterol                                                       /                                  TAKE                                                       BY                                           MOUTH 01/11/24   Worthy Rancher B, FNP  Baclofen 5 MG TABS Take 0.5 tablets (2.5 mg total) by mouth 2 (two) times daily. 11/16/23   Joseph Art, DO  Blood Glucose Monitoring Suppl (ONE TOUCH ULTRA 2) w/Device KIT Check blood sugar 1 time a day-DX-E11.9 03/23/23   [name], MD  Cholecalciferol (VITAMIN D PO) Take 1 tablet by mouth daily.     [provider]  clotrimazole-betamethasone (LOTRISONE)  cream Apply to affected area 2 to 4 x / day 05/17/23 05/16/24  [name], NP  Cyanocobalamin (B-12) 50 MCG TABS Take 50 mcg by mouth daily.    [provider]  ELIQUIS 5 MG TABS tablet TAKE 1 TABLET BY MOUTH TWICE  DAILY TO PREVENT BLOOD CLOTS 11/09/23   [name], NP  Emollient (AQUAPHOR ADV PROTECT HEALING) OINT Apply small amount to two areas on buttocks daily to prevent breakdown 07/19/23   [name], NP  ferrous sulfate 325 (65 FE) MG tablet Take 325 mg by mouth at  bedtime.    [provider]  finasteride (PROSCAR) 5 MG tablet TAKE 1 TABLET BY MOUTH DAILY FOR PROSTATE 09/15/23   [name], NP  glucose blood (ONETOUCH ULTRA) test strip Use as instructed 03/23/23   [name], MD  Lancets [facility] DELICA PLUS [place]) MISC CHECK BLOOD SUGAR ONCE A DAY. 03/23/23   [name], MD  Lancets Misc. (ONE TOUCH SURESOFT) MISC Check blood sugar 1 time a day-DX-E11.91 03/23/23   [name], MD  levothyroxine (SYNTHROID) 50 MCG tablet TAKE 1 TABLET BY MOUTH DAILY ON  AN EMPTY STOMACH WITH ONLY WATER FOR 30 MINUTES AND NO ANTACID,  MEDS, CALCIUM OR MAGNESIUM FOR 4 HOURS AND AVOID BIOTIN Patient taking differently: Take 25-50 mcg by mouth See admin instructions. Take 1 tablet by mouth on Sun, Tues, Thurs and 1/2 tablet all other days ON AN EMPTY STOMACH WITH ONLY WATER FOR 30 MINUTES AND NO ANTACID,  MEDS, CALCIUM OR MAGNESIUM FOR 4 HOURS AND AVOID BIOTIN 05/23/23   [name], NP  Magnesium 250 MG TABS Take 250 mg by mouth daily.     [provider]  metFORMIN (GLUCOPHAGE-XR) 500 MG 24 hr tablet TAKE 2 TABLETS BY MOUTH TWICE  DAILY WITH MEALS FOR DIABETES 07/31/23   [name], NP  metoprolol succinate (TOPROL-XL) 50 MG 24 hr tablet Take 1 tablet (50 mg total) by mouth at bedtime. NEEDS FOLLOW UP APPOINTMENT FOR MORE REFILLS 01/11/24   Laurey Morale, MD  Multiple Vitamin (MULTIVITAMIN) capsule Take 1 capsule by mouth daily.     [provider]  NONFORMULARY OR COMPOUNDED ITEM Please dispense a Rolling Walker.  Dx:R26.81 & G63 12/08/22   Nita Sickle K, DO  potassium chloride SA (KLOR-CON M20) 20 MEQ tablet Take 1 tablet by mouth once daily 09/25/23   [place], Anderson Malta, FNP  sacubitril-valsartan (ENTRESTO) 24-26 MG Take 1 tablet by mouth 2 (two) times daily. 01/31/24   Bensimhon, Bevelyn Buckles, MD  spironolactone (ALDACTONE) 25 MG tablet TAKE 1 TABLET BY MOUTH AT BEDTIME 12/12/23   Laurey Morale, MD  Tafamidis [facility]) 61 MG CAPS Take 1 capsule by mouth daily. 04/12/23   Laurey Morale, MD  torsemide (DEMADEX) 20 MG tablet Take  1 tablet   2 x /day  for Fluid Retention & Leg / Ankle Swelling 03/23/23   [name], MD  triamcinolone cream (KENALOG) 0.1 % Apply 1 Application topically as needed.    [provider]  trolamine salicylate (ASPERCREME) 10 % cream Apply 1 application topically daily as needed for muscle pain.    [provider]  VITAMIN A PO Take by mouth. 3000 mg    [provider]  vitamin C (ASCORBIC ACID) 500 MG tablet Take 500 mg by mouth daily.    [provider]      Allergies    Penicillins    Review of Systems   Review of Systems  Physical Exam Updated Vital Signs BP (!) 123/58   Pulse 93   Temp (!) 100.5 F (38.1 C) (Axillary)   Resp 16   Wt 72.1 kg   SpO2 96%   BMI 22.18 kg/m  Physical Exam Vitals and nursing note reviewed.  Constitutional:      General: He is not in acute distress.    Appearance: He is well-developed.     Comments: Alert and oriented x 3  HENT:     Head: Normocephalic and atraumatic.     Right Ear: External  ear normal.     Left Ear: External ear normal.     Nose: Nose normal.  Eyes:     Extraocular Movements: Extraocular movements intact.     Conjunctiva/sclera: Conjunctivae normal.     Pupils: Pupils are equal, round, and reactive to light.  Cardiovascular:     Rate and Rhythm: Normal rate and regular  rhythm.     Heart sounds: Normal heart sounds.  Pulmonary:     Effort: Pulmonary effort is normal. No respiratory distress.     Breath sounds: Normal breath sounds.  Abdominal:     General: There is no distension.     Palpations: Abdomen is soft. There is no mass.     Tenderness: There is no abdominal tenderness. There is no guarding.  Musculoskeletal:     Cervical back: Normal range of motion and neck supple.     Right lower leg: Edema present.     Left lower leg: Edema present.  Skin:    General: Skin is warm and dry.  Neurological:     Mental Status: He is alert.     Comments: NIHSS Exam  Level of Consciousness: Alert  LOC Questions: Answers Month and Age Correctly  LOC Commands: Opens and Closes Eyes and Hands on command  Best Gaze: Horizontal ocular movements intact  Visual Fields: No visual field loss  Facial Palsy: Slight nasolabial flattening on the right L Upper Extremity Motor: No drift after 10 seconds  R Upper Extremity Motor: No drift after 10 seconds  L Lower extremity Motor: No drift after 5 seconds  R Lower extremity Motor: No drift after 5 seconds  Ataxia: Absent  Sensory: Intact sensation to light touch on face, arms, trunk, and legs bilaterally  Best Language: Difficulty with repetition Dysarthria: No dysarthria  Neglect: No visual or sensory neglect    Psychiatric:        Mood and Affect: Mood normal.        Behavior: Behavior normal.     ED Results / Procedures / Treatments   Labs (all labs ordered are listed, but only abnormal results are displayed) Labs Reviewed  CBC - Abnormal; Notable for the following components:      Result Value   RBC 3.97 (*)    Hemoglobin 11.9 (*)    HCT 35.1 (*)    Platelets 149 (*)    All other components within normal limits  CBG MONITORING, ED - Abnormal; Notable for the following components:   Glucose-Capillary 285 (*)    All other components within normal limits  RESP PANEL BY RT-PCR (RSV, FLU A&B, COVID)   [id number]  URINALYSIS, ROUTINE W REFLEX MICROSCOPIC  BASIC METABOLIC PANEL  CK    EKG EKG Interpretation Date/Time:  Wednesday February 14 2024 19:58:50 EDT Ventricular Rate:  95 PR Interval:  178 QRS Duration:  76 QT Interval:  366 QTC Calculation: 459 R Axis:   -27  Text Interpretation: Sinus rhythm with Premature supraventricular complexes Otherwise normal ECG When compared with ECG of 14-Nov-2023 16:08, PREVIOUS ECG IS PRESENT Confirmed by Vanetta Mulders [phone]) on 02/14/2024 8:01:59 PM  Radiology No results found.  Procedures Procedures  {Document cardiac monitor, telemetry assessment procedure when appropriate:1}  Medications Ordered in ED Medications - No data to display  ED Course/ Medical Decision Making/ A&P   {   Click here for ABCD2, HEART and other calculatorsREFRESH Note before signing :1}  Medical Decision Making Amount and/or Complexity of Data Reviewed Labs: ordered. Radiology: ordered.   ***  {Document critical care time when appropriate:1} {Document review of labs and clinical decision tools ie heart score, Chads2Vasc2 etc:1}  {Document your independent review of radiology images, and any outside records:1} {Document your discussion with family members, caretakers, and with consultants:1} {Document social determinants of health affecting pt's care:1} {Document your decision making why or why not admission, treatments were needed:1} Final Clinical Impression(s) / ED Diagnoses Final diagnoses:  None    Rx / DC Orders ED Discharge Orders     None

## 2024-02-15 ENCOUNTER — Encounter: Payer: Self-pay | Admitting: Sports Medicine

## 2024-02-15 ENCOUNTER — Encounter: Payer: Self-pay | Admitting: Cardiology

## 2024-02-15 ENCOUNTER — Encounter: Payer: Self-pay | Admitting: Neurology

## 2024-02-15 DIAGNOSIS — U071 COVID-19: Secondary | ICD-10-CM | POA: Diagnosis present

## 2024-02-15 LAB — I-STAT CHEM 8, ED
BUN: 20 mg/dL (ref 8–23)
Calcium, Ion: 0.96 mmol/L — ABNORMAL LOW (ref 1.15–1.40)
Chloride: 108 mmol/L (ref 98–111)
Creatinine, Ser: 0.8 mg/dL (ref 0.61–1.24)
Glucose, Bld: 248 mg/dL — ABNORMAL HIGH (ref 70–99)
HCT: 33 % — ABNORMAL LOW (ref 39.0–52.0)
Hemoglobin: 11.2 g/dL — ABNORMAL LOW (ref 13.0–17.0)
Potassium: 6 mmol/L — ABNORMAL HIGH (ref 3.5–5.1)
Sodium: 137 mmol/L (ref 135–145)
TCO2: 23 mmol/L (ref 22–32)

## 2024-02-15 LAB — BASIC METABOLIC PANEL WITH GFR
Anion gap: 9 (ref 5–15)
Anion gap: 8 (ref 5–15)
BUN: 17 mg/dL (ref 8–23)
BUN: 12 mg/dL (ref 8–23)
CO2: 20 mmol/L — ABNORMAL LOW (ref 22–32)
CO2: 22 mmol/L (ref 22–32)
Calcium: 8.5 mg/dL — ABNORMAL LOW (ref 8.9–10.3)
Calcium: 8.1 mg/dL — ABNORMAL LOW (ref 8.9–10.3)
Chloride: 109 mmol/L (ref 98–111)
Chloride: 108 mmol/L (ref 98–111)
Creatinine, Ser: 0.96 mg/dL (ref 0.61–1.24)
Creatinine, Ser: 1.09 mg/dL (ref 0.61–1.24)
GFR, Estimated: >60 mL/min (ref >60)
GFR, Estimated: >60 mL/min (ref >60)
Glucose, Bld: 277 mg/dL — ABNORMAL HIGH (ref 70–99)
Glucose, Bld: 200 mg/dL — ABNORMAL HIGH (ref 70–99)
Potassium: 5.2 mmol/L — ABNORMAL HIGH (ref 3.5–5.1)
Potassium: 4 mmol/L (ref 3.5–5.1)
Sodium: 138 mmol/L (ref 135–145)
Sodium: 138 mmol/L (ref 135–145)

## 2024-02-15 LAB — GLUCOSE, CAPILLARY: Glucose-Capillary: 224 mg/dL — ABNORMAL HIGH (ref 70–99)

## 2024-02-15 LAB — CK: Total CK: 442 U/L — ABNORMAL HIGH (ref 49–397)

## 2024-02-15 LAB — CBG MONITORING, ED
Glucose-Capillary: 287 mg/dL — ABNORMAL HIGH (ref 70–99)
Glucose-Capillary: 161 mg/dL — ABNORMAL HIGH (ref 70–99)

## 2024-02-15 LAB — HEPATIC FUNCTION PANEL
ALT: 29 U/L (ref 0–44)
AST: 54 U/L — ABNORMAL HIGH (ref 15–41)
Albumin: 3.1 g/dL — ABNORMAL LOW (ref 3.5–5.0)
Alkaline Phosphatase: 53 U/L (ref 38–126)
Bilirubin, Direct: 0.5 mg/dL — ABNORMAL HIGH (ref 0.0–0.2)
Indirect Bilirubin: 0.7 mg/dL
Total Bilirubin: 1.2 mg/dL (ref 0.0–1.2)
Total Protein: 5.6 g/dL — ABNORMAL LOW (ref 6.5–8.1)

## 2024-02-15 MED ORDER — ACETAMINOPHEN 325 MG PO TABS
650.0000 mg | ORAL_TABLET | Freq: Four times a day (QID) | ORAL | Status: DC | PRN
  Administered 2024-02-15: 650 mg via ORAL
  Filled 2024-02-15: qty 2

## 2024-02-15 MED ORDER — TAFAMIDIS 61 MG PO CAPS
1.0000 | ORAL_CAPSULE | Freq: Every day | ORAL | Status: DC
  Administered 2024-02-16 – 2024-02-29 (×14): 61 mg via ORAL
  Filled 2024-02-15 (×14): qty 1

## 2024-02-15 MED ORDER — METOPROLOL SUCCINATE ER 50 MG PO TB24
50.0000 mg | ORAL_TABLET | Freq: Every day | ORAL | Status: DC
  Administered 2024-02-15 – 2024-02-22 (×8): 50 mg via ORAL
  Filled 2024-02-15 (×8): qty 1

## 2024-02-15 MED ORDER — LEVOTHYROXINE SODIUM 25 MCG PO TABS
25.0000 ug | ORAL_TABLET | Freq: Every day | ORAL | Status: DC
Start: 2024-02-15 — End: 2024-02-29
  Administered 2024-02-15 – 2024-02-29 (×15): 25 ug via ORAL
  Filled 2024-02-15 (×15): qty 1

## 2024-02-15 MED ORDER — POLYETHYLENE GLYCOL 3350 17 G PO PACK
17.0000 g | PACK | Freq: Every day | ORAL | Status: DC | PRN
  Administered 2024-02-19 – 2024-02-25 (×3): 17 g via ORAL
  Filled 2024-02-15 (×3): qty 1

## 2024-02-15 MED ORDER — ACETAMINOPHEN 650 MG RE SUPP: 650.0000 mg | Freq: Four times a day (QID) | RECTAL | Status: DC | PRN

## 2024-02-15 MED ORDER — INSULIN ASPART 100 UNIT/ML IJ SOLN
0.0000 [IU] | Freq: Three times a day (TID) | INTRAMUSCULAR | Status: DC
  Administered 2024-02-15: 5 [IU] via SUBCUTANEOUS
  Administered 2024-02-15 – 2024-02-16 (×3): 2 [IU] via SUBCUTANEOUS
  Administered 2024-02-16: 5 [IU] via SUBCUTANEOUS
  Administered 2024-02-17: 3 [IU] via SUBCUTANEOUS
  Administered 2024-02-17: 5 [IU] via SUBCUTANEOUS
  Administered 2024-02-18: 3 [IU] via SUBCUTANEOUS
  Administered 2024-02-18 – 2024-02-19 (×3): 2 [IU] via SUBCUTANEOUS
  Administered 2024-02-19: 9 [IU] via SUBCUTANEOUS
  Administered 2024-02-20: 5 [IU] via SUBCUTANEOUS
  Administered 2024-02-20: 2 [IU] via SUBCUTANEOUS
  Administered 2024-02-21: 3 [IU] via SUBCUTANEOUS
  Administered 2024-02-21: 2 [IU] via SUBCUTANEOUS
  Administered 2024-02-22: 1 [IU] via SUBCUTANEOUS
  Administered 2024-02-22 (×2): 3 [IU] via SUBCUTANEOUS
  Administered 2024-02-23: 9 [IU] via SUBCUTANEOUS
  Administered 2024-02-23: 3 [IU] via SUBCUTANEOUS
  Administered 2024-02-24: 7 [IU] via SUBCUTANEOUS
  Administered 2024-02-24: 3 [IU] via SUBCUTANEOUS
  Administered 2024-02-25: 5 [IU] via SUBCUTANEOUS
  Administered 2024-02-25: 1 [IU] via SUBCUTANEOUS
  Administered 2024-02-26: 9 [IU] via SUBCUTANEOUS
  Administered 2024-02-26: 3 [IU] via SUBCUTANEOUS
  Administered 2024-02-27: 5 [IU] via SUBCUTANEOUS
  Administered 2024-02-27 – 2024-02-28 (×2): 3 [IU] via SUBCUTANEOUS
  Administered 2024-02-28: 5 [IU] via SUBCUTANEOUS

## 2024-02-15 MED ORDER — BENZONATATE 100 MG PO CAPS
100.0000 mg | ORAL_CAPSULE | Freq: Three times a day (TID) | ORAL | Status: DC
  Administered 2024-02-15 – 2024-02-29 (×43): 100 mg via ORAL
  Filled 2024-02-15 (×43): qty 1

## 2024-02-15 MED ORDER — HYDRALAZINE HCL 20 MG/ML IJ SOLN: 10.0000 mg | Freq: Four times a day (QID) | INTRAMUSCULAR | Status: DC | PRN

## 2024-02-15 MED ORDER — SPIRONOLACTONE 12.5 MG HALF TABLET: 25.0000 mg | ORAL_TABLET | Freq: Every day | ORAL | Status: DC

## 2024-02-15 MED ORDER — SACUBITRIL-VALSARTAN 24-26 MG PO TABS
1.0000 | ORAL_TABLET | Freq: Two times a day (BID) | ORAL | Status: DC
Start: 2024-02-15 — End: 2024-02-16
  Administered 2024-02-15 – 2024-02-16 (×2): 1 via ORAL
  Filled 2024-02-15 (×2): qty 1

## 2024-02-15 MED ORDER — SODIUM ZIRCONIUM CYCLOSILICATE 10 G PO PACK
10.0000 g | PACK | Freq: Once | ORAL | Status: AC
  Administered 2024-02-15: 10 g via ORAL
  Filled 2024-02-15: qty 1

## 2024-02-15 MED ORDER — BISACODYL 5 MG PO TBEC
5.0000 mg | DELAYED_RELEASE_TABLET | Freq: Every day | ORAL | Status: DC | PRN
  Administered 2024-02-16 – 2024-02-25 (×3): 5 mg via ORAL
  Filled 2024-02-15 (×3): qty 1

## 2024-02-15 MED ORDER — APIXABAN 5 MG PO TABS
5.0000 mg | ORAL_TABLET | Freq: Two times a day (BID) | ORAL | Status: DC
  Administered 2024-02-15 – 2024-02-29 (×29): 5 mg via ORAL
  Filled 2024-02-15 (×29): qty 1

## 2024-02-15 MED ORDER — TORSEMIDE 20 MG PO TABS: 20.0000 mg | ORAL_TABLET | Freq: Two times a day (BID) | ORAL | Status: DC

## 2024-02-15 MED ORDER — ENOXAPARIN SODIUM 40 MG/0.4ML IJ SOSY
40.0000 mg | PREFILLED_SYRINGE | INTRAMUSCULAR | Status: DC
Start: 2024-02-16 — End: 2024-02-15

## 2024-02-15 MED ORDER — ATORVASTATIN CALCIUM 40 MG PO TABS
40.0000 mg | ORAL_TABLET | Freq: Every day | ORAL | Status: DC
  Administered 2024-02-15 – 2024-02-21 (×7): 40 mg via ORAL
  Filled 2024-02-15 (×8): qty 1

## 2024-02-15 MED ORDER — ALBUTEROL SULFATE (2.5 MG/3ML) 0.083% IN NEBU: 2.5000 mg | INHALATION_SOLUTION | Freq: Four times a day (QID) | RESPIRATORY_TRACT | Status: DC | PRN

## 2024-02-15 MED ORDER — INSULIN ASPART 100 UNIT/ML IJ SOLN
0.0000 [IU] | Freq: Every day | INTRAMUSCULAR | Status: DC
  Administered 2024-02-15 – 2024-02-16 (×2): 2 [IU] via SUBCUTANEOUS
  Administered 2024-02-17: 3 [IU] via SUBCUTANEOUS
  Administered 2024-02-18: 2 [IU] via SUBCUTANEOUS
  Administered 2024-02-28: 3 [IU] via SUBCUTANEOUS

## 2024-02-15 MED ORDER — FINASTERIDE 5 MG PO TABS
5.0000 mg | ORAL_TABLET | Freq: Every day | ORAL | Status: DC
  Administered 2024-02-15 – 2024-02-29 (×15): 5 mg via ORAL
  Filled 2024-02-15 (×15): qty 1

## 2024-02-15 NOTE — Progress Notes (Signed)
 Received on cart from ED.  Oriented to room and surroundings.  Skin assessment done and has dried scabs to left medial ankle, right anterior medial knee, and bilat heels, and periph vascular color changes to BLE.[name]  Daughter accompanied and isolation precaution education done with pt daughter, as pt is COVID positive.

## 2024-02-15 NOTE — ED Notes (Signed)
 Spoke with Dr Madilyn Hook and it is okay to order pt heart healthy diet for this morning.

## 2024-02-15 NOTE — H&P (Addendum)
 [facility] History and Physical  Earl [name] ZOX:[id number] DOB: 09-Feb-1941 DOA: 02/14/2024 PCP: [name], MD  Presented from: Home Chief Complaint: Increased weakness  History of Present Illness: Earl [name] is a 83 y.o. male with PMH significant for DM2, HTN, HLD, CHF, A-fib on Eliquis, stroke with residual right-sided weakness, MGUS, GERD, BPH, hypothyroidism Patient was brought to the ED last night from home by EMS with generalized weakness.  Patient lives at home, has caregivers for few hours a day for 4 days a week.  Daughter lives close by and pays close attention. Has raised right-sided weakness since last stroke few years ago, able to walk with a walker.  Baseline mental status normal. Reports compliance to medications including blood thinner and CHF meds  3/26, daughter noted that patient was weaker than normal.  He was leaning on his left side all day.  He had a fall as well. Also had a low-grade fever. Denies any respiratory symptoms  In the ED, he had a temperature of 100.5, hemodynamically stable Labs with WC count 5.3, hemoglobin 11.9, platelet 149, potassium 5.2, glucose 277, CK elevated to 442 Urinalysis unremarkable for infection CT scan of head and MRI of brain did not show any acute intracranial process.  It showed atrophy with chronic microvascular ischemic changes and chronic right cerebellar infarct. Chest x-ray unremarkable Respiratory virus panel positive for COVID Hospitalist service was consulted for inpatient workup and management  I received this patient as a carryover admission from last night At the time of my evaluation, patient was somnolent.  Not in distress.  Open eyes to command and mumbled few answers. Daughter at bedside who helped with most of the history. History reviewed and detailed as above She states patient attended church service on Sunday.  Has no other exposure to public.  She did not notice any  respiratory symptoms or any other symptom other than weakness yesterday.  Review of Systems:  All systems were reviewed and were negative unless otherwise mentioned in the HPI   Past medical history: Past Medical History:  Diagnosis Date   Adenomatous colon polyp    Allergy    Anal fissure    Arthritis    BPH (benign prostatic hyperplasia)    Diabetes mellitus (HCC)    GERD (gastroesophageal reflux disease)    Hyperlipidemia    Hypertension    Hypothyroidism    Other testicular hypofunction    Stroke [facility])     Past surgical history: Past Surgical History:  Procedure Laterality Date   CIRCUMCISION N/A 06/11/2019   Procedure: CIRCUMCISION ADULT;  Surgeon: Jerilee Field, MD;  Location: [facility] ORS;  Service: Urology;  Laterality: N/A;   COLONOSCOPY     INGUINAL HERNIA REPAIR Bilateral    1968 and 2004   LOOP RECORDER INSERTION N/A 07/28/2017   Procedure: LOOP RECORDER INSERTION;  Surgeon: Duke Salvia, MD;  Location[facility] INVASIVE CV LAB;  Service: Cardiovascular;  Laterality: N/A;   RIGHT/LEFT HEART CATH AND CORONARY ANGIOGRAPHY N/A 10/07/2021   Procedure: RIGHT/LEFT HEART CATH AND CORONARY ANGIOGRAPHY;  Surgeon: Laurey Morale, MD;  Location: [facility] INVASIVE CV LAB;  Service: Cardiovascular;  Laterality: N/A;   TEE WITHOUT CARDIOVERSION N/A 07/28/2017   Procedure: TRANSESOPHAGEAL ECHOCARDIOGRAM (TEE);  Surgeon: Quintella Reichert, MD;  Location: [facility] ENDOSCOPY;  Service: Cardiovascular;  Laterality: N/A;    Social History:  reports that he has never smoked. He has never used smokeless tobacco. He reports that he does not drink alcohol and does not use  drugs.  Allergies:  Allergies  Allergen Reactions   Penicillins Other (See Comments)    Did it involve swelling of the face/tongue/throat, SOB, or low BP? Unknown Did it involve sudden or severe rash/hives, skin peeling, or any reaction on the inside of your mouth or nose? Unknown Did you need to seek medical attention at a hospital  or doctor's office? No When did it last happen?    Over 74 Years Ago   If all above answers are "NO", may proceed with cephalosporin use.     Penicillins   Family history:  Family History  Problem Relation Age of Onset   Sickle cell anemia Mother    Diabetes Father    Stomach cancer Father    Colon cancer Father    CAD Neg Hx    Esophageal cancer Neg Hx    Rectal cancer Neg Hx      Physical Exam: Vitals:   02/15/24 0600 02/15/24 0700 02/15/24 0800 02/15/24 0803  BP: 109/65 120/63 114/62   Pulse: 83 84 86   Resp: (!) 21 20 (!) 22   Temp:    (!) 102.4 F (39.1 C)  TempSrc:    Oral  SpO2: 97% 96% 95%   Weight:       Wt Readings from Last 3 Encounters:  02/14/24 72.1 kg  01/24/24 72.1 kg  01/17/24 72.1 kg   Body mass index is 22.18 kg/m.  General exam: Pleasant, elderly African-American male.  Not in pain Skin: No rashes, lesions or ulcers. HEENT: Atraumatic, normocephalic, no obvious bleeding Lungs: Clear to auscultation bilaterally, no crackles or wheezing CVS: S1, S2, no murmur,   GI/Abd: Soft, nontender, nondistended, bowel sound present,   CNS: Somnolent, opens eyes on command and mumbles few answers.  Able to follow command.  Generalized weakness noted.  Right upper extremity more weak than left which is his baseline Psychiatry: Sad affect Extremities: Trace bilateral pedal edema, no calf tenderness,    ----------------------------------------------------------------------------------------------------------------------------------------- ----------------------------------------------------------------------------------------------------------------------------------------- -----------------------------------------------------------------------------------------------------------------------------------------  Assessment/Plan: Principal Problem:   COVID-19 virus infection  COVID-19 infection Presented as weakness, lethargy No respiratory symptoms.   Chest x-ray negative for pneumonia. Respiratory status stable.  Breathing on room air I do not think there is any indication for antiviral or steroids at this time. Start supportive care for COVID with as needed Tylenol, as needed antitussives. Fever may continue for another 24 hours.  If persistent, consider blood culture and empiric antibiotics. PT eval will help  Hyperkalemia Potassium level elevated up to 6 last night. Hold Aldactone. Give 1 dose of Lokelma.  Repeat potassium at 2 PM.  Rhabdomyolysis Has elevated probably being in the same position for several hours. 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  Keep Aldactone and torsemide on hold Patient is also on Tafamidis for amyloid cardiomyopathy.  A-fib Continue Toprol and Eliquis  H/o stroke with residual right-sided weakness HLD Continue Eliquis, Lipitor It seems he is also on baclofen without hold for now  Type 2 diabetes mellitus A1c 6.8 on 10/31/2023 PTA meds-metformin 500 mg twice daily, hold for now Start SSI/Accu-Cheks Recent Labs  Lab 02/14/24 2028  GLUCAP 285*   Hypothyroidism Synthroid  BPH Finasteride  Mobility: PT  eval  Goals of care:   Code Status: Full Code    DVT prophylaxis:   apixaban (ELIQUIS) tablet 5 mg   Antimicrobials: None Fluid: None Consultants: None Family Communication: Daughter at bedside  Status: Observation Level of care:  Progressive   Patient is from: Home Anticipated d/c to: Pending clinical course  Diet: Diet Order             Diet Heart Room service appropriate? Yes; Fluid consistency: Thin  Diet effective now                     ------------------------------------------------------------------------------------- Severity of Illness: The appropriate patient status for this patient is OBSERVATION. Observation status is judged to be reasonable and necessary in order to provide the required intensity of service to ensure the patient's safety. The patient's presenting symptoms, physical exam findings, and initial radiographic and laboratory data in the context of their medical condition is felt to place them at decreased risk for further clinical deterioration. Furthermore, it is anticipated that the patient will be medically stable for discharge from the hospital within 2 midnights of admission.  -------------------------------------------------------------------------------------   Home Meds: Prior to Admission medications   Medication Sig Start Date End Date Taking? Authorizing Provider  AMVUTTRA 25 MG/0.5ML syringe INJECT 25MG  (0.5ML) SUBCUTANEOUSLY ONCE EVERY 3 MONTHS. ROTATE ADMINISTRATION SITE WITH EACH INJECTION. *REFRIGERATE. DO NOT SHAKE.* 10/24/23  Yes Laurey Morale, MD  atorvastatin (LIPITOR) 40 MG tablet Take  1 tablet  Daily for Cholesterol                                                       /                                  TAKE                                                       BY                                           MOUTH 01/11/24  Yes Worthy Rancher B, FNP  Baclofen 5 MG TABS Take 0.5 tablets (2.5 mg total) by mouth 2 (two) times daily. 11/16/23  Yes Joseph Art, DO  Cholecalciferol (VITAMIN D PO) Take 1 tablet by mouth daily.    Yes [provider]  clotrimazole-betamethasone (LOTRISONE) cream Apply to affected area 2 to 4 x / day 05/17/23 05/16/24 Yes [name], NP  Cyanocobalamin (B-12) 50 MCG TABS Take 50 mcg by mouth daily.   Yes [provider]  ELIQUIS 5 MG TABS tablet TAKE 1 TABLET BY MOUTH TWICE  DAILY TO PREVENT BLOOD CLOTS 11/09/23  Yes [name], NP  Emollient (AQUAPHOR ADV PROTECT HEALING) OINT Apply small amount to two areas on buttocks daily to prevent breakdown 07/19/23  Yes [name], NP  ferrous sulfate 325 (65 FE) MG tablet Take 325 mg by mouth at  bedtime.   Yes [provider]  finasteride (PROSCAR) 5 MG tablet TAKE 1 TABLET BY MOUTH DAILY FOR PROSTATE 09/15/23  Yes [name], NP  levothyroxine (SYNTHROID) 50 MCG tablet TAKE 1 TABLET BY MOUTH DAILY ON  AN EMPTY STOMACH WITH ONLY WATER FOR 30 MINUTES AND NO ANTACID,  MEDS, CALCIUM OR MAGNESIUM FOR 4 HOURS AND AVOID BIOTIN Patient taking differently: Take 25-50 mcg by mouth See admin instructions. Take 1 tablet by mouth on Sun, Tues, Thurs and 1/2 tablet all other days ON AN EMPTY STOMACH WITH ONLY WATER FOR 30 MINUTES AND NO ANTACID,  MEDS, CALCIUM OR MAGNESIUM FOR 4 HOURS AND AVOID BIOTIN 05/23/23  Yes [name], NP  Magnesium 250 MG TABS Take 250 mg by mouth daily.    Yes [provider]  metFORMIN (GLUCOPHAGE-XR) 500 MG 24 hr tablet TAKE 2 TABLETS BY MOUTH TWICE  DAILY WITH MEALS FOR DIABETES 07/31/23  Yes [name], NP  metoprolol succinate (TOPROL-XL) 50 MG 24 hr tablet Take 1 tablet (50 mg total) by mouth at bedtime. NEEDS FOLLOW UP APPOINTMENT FOR MORE REFILLS 01/11/24  Yes Laurey Morale, MD  Multiple Vitamin (MULTIVITAMIN) capsule Take 1 capsule by mouth daily.   Yes [provider]  potassium chloride SA (KLOR-CON M20) 20 MEQ tablet Take 1 tablet by mouth once daily 09/25/23  Yes Milford, Jessica M, FNP  sacubitril-valsartan (ENTRESTO) 24-26 MG Take 1 tablet by mouth 2 (two) times daily. 01/31/24  Yes Bensimhon, Bevelyn Buckles, MD  spironolactone (ALDACTONE) 25 MG tablet TAKE 1 TABLET BY MOUTH AT BEDTIME 12/12/23  Yes Laurey Morale, MD  Tafamidis [facility]) 61 MG CAPS Take 1 capsule by mouth daily. 04/12/23  Yes Laurey Morale, MD  torsemide (DEMADEX) 20 MG tablet Take  1 tablet   2 x /day  for Fluid Retention & Leg /  Ankle Swelling Patient taking differently: Take 20 mg by mouth 2 (two) times daily. for Fluid Retention & Leg / Ankle Swelling 03/23/23  Yes [name], MD  trolamine salicylate (ASPERCREME) 10 % cream Apply 1 application topically daily as needed for muscle pain.   Yes [provider]  VITAMIN A PO Take by mouth. 3000 mg   Yes [provider]  vitamin C (ASCORBIC ACID) 500 MG tablet Take 500 mg by mouth daily.   Yes [provider]  Blood Glucose Monitoring Suppl (ONE TOUCH ULTRA 2) w/Device KIT Check blood sugar 1 time a day-DX-E11.9 03/23/23   [name], MD  glucose blood [facility] ULTRA) test strip Use as instructed 03/23/23   [name], MD  Lancets [facility] DELICA PLUS [facility]) MISC CHECK BLOOD SUGAR ONCE A DAY. 03/23/23   [name], MD  Lancets Misc. (ONE TOUCH SURESOFT) MISC Check blood sugar 1 time a day-DX-E11.91 03/23/23   [name], MD  NONFORMULARY OR COMPOUNDED ITEM Please dispense a Rolling Walker.  Dx:R26.81 & G63 12/08/22   [name], DO    Labs on Admission:   CBC: Recent Labs  Lab 02/14/24 2035 02/14/24 2342  WBC 5.3  --   HGB 11.9* 11.2*  HCT 35.1* 33.0*  MCV 88.4  --   PLT 149*  --     Basic Metabolic Panel: Recent Labs  Lab 02/14/24 2245 02/14/24 2342  NA 138 137  K 5.2* 6.0*  CL 109 108  CO2 20*  --   GLUCOSE 277* 248*  BUN 17 20  CREATININE 0.96 0.80  CALCIUM 8.5*  --  Liver Function Tests: Recent Labs  Lab 02/14/24 2245  AST 54*  ALT 29  ALKPHOS 53  BILITOT 1.2  PROT 5.6*  ALBUMIN 3.1*   No results for input(s): "LIPASE", "AMYLASE" in the last 168 hours. No results for input(s): "AMMONIA" in the last 168 hours.  Cardiac Enzymes: Recent Labs  Lab 02/14/24 2245  CKTOTAL 442*    BNP (last 3 results) Recent Labs    08/23/23 1541  BNP 99.2    ProBNP (last 3 results) No results for input(s): "PROBNP" in the last 8760 hours.  CBG: Recent Labs  Lab 02/14/24 2028   GLUCAP 285*    Lipase     Component Value Date/Time   LIPASE 40 01/16/2020 1119     Urinalysis    Component Value Date/Time   COLORURINE YELLOW 02/14/2024 2218   APPEARANCEUR CLEAR 02/14/2024 2218   LABSPEC 1.011 02/14/2024 2218   PHURINE 6.0 02/14/2024 2218   GLUCOSEU NEGATIVE 02/14/2024 2218   HGBUR NEGATIVE 02/14/2024 2218   BILIRUBINUR NEGATIVE 02/14/2024 2218   KETONESUR 5 (A) 02/14/2024 2218   PROTEINUR NEGATIVE 02/14/2024 2218   UROBILINOGEN 0.2 05/05/2015 1443   NITRITE NEGATIVE 02/14/2024 2218   LEUKOCYTESUR NEGATIVE 02/14/2024 2218     Drugs of Abuse     Component Value Date/Time   LABOPIA NONE DETECTED 07/25/2017 1911   COCAINSCRNUR NONE DETECTED 07/25/2017 1911   LABBENZ NONE DETECTED 07/25/2017 1911   AMPHETMU NONE DETECTED 07/25/2017 1911   THCU NONE DETECTED 07/25/2017 1911   LABBARB NONE DETECTED 07/25/2017 1911      Radiological Exams on Admission: MR BRAIN WO CONTRAST Result Date: 02/15/2024 CLINICAL DATA:  difficulty speaking, leaning to right EXAM: MRI HEAD WITHOUT CONTRAST TECHNIQUE: Multiplanar, multiecho pulse sequences of the brain and surrounding structures were obtained without intravenous contrast. COMPARISON:  Same day CT head. FINDINGS: Brain: No acute infarction, hemorrhage, hydrocephalus, extra-axial collection or mass lesion. Remote right cerebellar infarct. Vascular: Major arterial flow voids are maintained at the skull base. Skull and upper cervical spine: Normal marrow signal. Sinuses/Orbits: Negative. Other: No mastoid effusions. IMPRESSION: 1. No evidence of acute intracranial abnormality. 2. Remote right cerebellar infarct. Electronically Signed   By: Feliberto Harts M.D.   On: 02/15/2024 01:18   DG Chest Portable 1 View Result Date: 02/14/2024 CLINICAL DATA:  Fever and weakness EXAM: PORTABLE CHEST 1 VIEW COMPARISON:  07/23/2021 FINDINGS: Heart size and pulmonary vascularity are normal. Lungs are clear. No pleural effusion or  pneumothorax. Mediastinal contours appear intact. IMPRESSION: No active disease. Electronically Signed   By: Burman Nieves M.D.   On: 02/14/2024 21:46   CT Head Wo Contrast Result Date: 02/14/2024 CLINICAL DATA:  Neuro deficit, acute, stroke suspected. Leaning to left side all day with overall weakness. Fall today. EXAM: CT HEAD WITHOUT CONTRAST TECHNIQUE: Contiguous axial images were obtained from the base of the skull through the vertex without intravenous contrast. RADIATION DOSE REDUCTION: This exam was performed according to the departmental dose-optimization program which includes automated exposure control, adjustment of the mA and/or kV according to patient size and/or use of iterative reconstruction technique. COMPARISON:  11/14/2023. FINDINGS: Brain: No acute intracranial hemorrhage, midline shift or mass effect. No extra-axial fluid collection. Mild periventricular white matter hypodensities are present bilaterally. There is a encephalomalacia in the right cerebellar hemisphere. No hydrocephalus is seen. Vascular: No hyperdense vessel or unexpected calcification. Skull: Normal. Negative for fracture or focal lesion. Sinuses/Orbits: Mucosal thickening is noted in the ethmoid air cells bilaterally. There is calcification  in the region of the optic nerves in the posterior lobes, compatible with optic drusen. No acute orbital abnormality. Other: None. IMPRESSION: 1. No acute intracranial process. 2. Atrophy with chronic microvascular ischemic changes. 3. Chronic right cerebellar infarct. Electronically Signed   By: Thornell Sartorius M.D.   On: 02/14/2024 21:41     Signed, Lorin Glass, MD [facility] 02/15/2024

## 2024-02-15 NOTE — ED Notes (Signed)
 CCMD called.

## 2024-02-16 DIAGNOSIS — G928 Other toxic encephalopathy: Secondary | ICD-10-CM | POA: Diagnosis not present

## 2024-02-16 DIAGNOSIS — G629 Polyneuropathy, unspecified: Secondary | ICD-10-CM | POA: Diagnosis present

## 2024-02-16 DIAGNOSIS — N400 Enlarged prostate without lower urinary tract symptoms: Secondary | ICD-10-CM | POA: Diagnosis present

## 2024-02-16 DIAGNOSIS — Z794 Long term (current) use of insulin: Secondary | ICD-10-CM | POA: Diagnosis not present

## 2024-02-16 DIAGNOSIS — N401 Enlarged prostate with lower urinary tract symptoms: Secondary | ICD-10-CM

## 2024-02-16 DIAGNOSIS — I5022 Chronic systolic (congestive) heart failure: Secondary | ICD-10-CM

## 2024-02-16 DIAGNOSIS — Y92239 Unspecified place in hospital as the place of occurrence of the external cause: Secondary | ICD-10-CM | POA: Diagnosis not present

## 2024-02-16 DIAGNOSIS — E119 Type 2 diabetes mellitus without complications: Secondary | ICD-10-CM

## 2024-02-16 DIAGNOSIS — M6282 Rhabdomyolysis: Secondary | ICD-10-CM | POA: Diagnosis present

## 2024-02-16 DIAGNOSIS — E440 Moderate protein-calorie malnutrition: Secondary | ICD-10-CM | POA: Diagnosis present

## 2024-02-16 DIAGNOSIS — I2510 Atherosclerotic heart disease of native coronary artery without angina pectoris: Secondary | ICD-10-CM | POA: Diagnosis present

## 2024-02-16 DIAGNOSIS — W19XXXA Unspecified fall, initial encounter: Secondary | ICD-10-CM | POA: Diagnosis present

## 2024-02-16 DIAGNOSIS — E039 Hypothyroidism, unspecified: Secondary | ICD-10-CM | POA: Diagnosis present

## 2024-02-16 DIAGNOSIS — R531 Weakness: Secondary | ICD-10-CM | POA: Diagnosis present

## 2024-02-16 DIAGNOSIS — Z7189 Other specified counseling: Secondary | ICD-10-CM | POA: Diagnosis not present

## 2024-02-16 DIAGNOSIS — D472 Monoclonal gammopathy: Secondary | ICD-10-CM | POA: Diagnosis present

## 2024-02-16 DIAGNOSIS — U071 COVID-19: Secondary | ICD-10-CM | POA: Diagnosis present

## 2024-02-16 DIAGNOSIS — Y92009 Unspecified place in unspecified non-institutional (private) residence as the place of occurrence of the external cause: Secondary | ICD-10-CM | POA: Diagnosis not present

## 2024-02-16 DIAGNOSIS — I428 Other cardiomyopathies: Secondary | ICD-10-CM | POA: Diagnosis present

## 2024-02-16 DIAGNOSIS — E785 Hyperlipidemia, unspecified: Secondary | ICD-10-CM

## 2024-02-16 DIAGNOSIS — I480 Paroxysmal atrial fibrillation: Secondary | ICD-10-CM

## 2024-02-16 DIAGNOSIS — Z7901 Long term (current) use of anticoagulants: Secondary | ICD-10-CM

## 2024-02-16 DIAGNOSIS — R55 Syncope and collapse: Secondary | ICD-10-CM

## 2024-02-16 DIAGNOSIS — I69351 Hemiplegia and hemiparesis following cerebral infarction affecting right dominant side: Secondary | ICD-10-CM | POA: Diagnosis not present

## 2024-02-16 DIAGNOSIS — Z515 Encounter for palliative care: Secondary | ICD-10-CM | POA: Diagnosis not present

## 2024-02-16 DIAGNOSIS — Z8673 Personal history of transient ischemic attack (TIA), and cerebral infarction without residual deficits: Secondary | ICD-10-CM

## 2024-02-16 DIAGNOSIS — I10 Essential (primary) hypertension: Secondary | ICD-10-CM

## 2024-02-16 DIAGNOSIS — I5042 Chronic combined systolic (congestive) and diastolic (congestive) heart failure: Secondary | ICD-10-CM | POA: Diagnosis not present

## 2024-02-16 DIAGNOSIS — Z66 Do not resuscitate: Secondary | ICD-10-CM | POA: Diagnosis present

## 2024-02-16 DIAGNOSIS — I43 Cardiomyopathy in diseases classified elsewhere: Secondary | ICD-10-CM | POA: Diagnosis present

## 2024-02-16 DIAGNOSIS — E1141 Type 2 diabetes mellitus with diabetic mononeuropathy: Secondary | ICD-10-CM | POA: Diagnosis present

## 2024-02-16 DIAGNOSIS — E11649 Type 2 diabetes mellitus with hypoglycemia without coma: Secondary | ICD-10-CM | POA: Diagnosis not present

## 2024-02-16 DIAGNOSIS — L89150 Pressure ulcer of sacral region, unstageable: Secondary | ICD-10-CM | POA: Diagnosis not present

## 2024-02-16 DIAGNOSIS — I110 Hypertensive heart disease with heart failure: Secondary | ICD-10-CM | POA: Diagnosis present

## 2024-02-16 DIAGNOSIS — R350 Frequency of micturition: Secondary | ICD-10-CM

## 2024-02-16 DIAGNOSIS — D61818 Other pancytopenia: Secondary | ICD-10-CM | POA: Diagnosis present

## 2024-02-16 DIAGNOSIS — E875 Hyperkalemia: Secondary | ICD-10-CM | POA: Diagnosis present

## 2024-02-16 LAB — BASIC METABOLIC PANEL WITH GFR
Anion gap: 12 (ref 5–15)
BUN: 14 mg/dL (ref 8–23)
CO2: 21 mmol/L — ABNORMAL LOW (ref 22–32)
Calcium: 8.5 mg/dL — ABNORMAL LOW (ref 8.9–10.3)
Chloride: 103 mmol/L (ref 98–111)
Creatinine, Ser: 0.99 mg/dL (ref 0.61–1.24)
GFR, Estimated: >60 mL/min (ref >60)
Glucose, Bld: 181 mg/dL — ABNORMAL HIGH (ref 70–99)
Potassium: 4.1 mmol/L (ref 3.5–5.1)
Sodium: 136 mmol/L (ref 135–145)

## 2024-02-16 LAB — CBC
HCT: 37.5 % — ABNORMAL LOW (ref 39.0–52.0)
Hemoglobin: 12.9 g/dL — ABNORMAL LOW (ref 13.0–17.0)
MCH: 29.8 pg (ref 26.0–34.0)
MCHC: 34.4 g/dL (ref 30.0–36.0)
MCV: 86.6 fL (ref 80.0–100.0)
Platelets: 165 10*3/uL (ref 150–400)
RBC: 4.33 MIL/uL (ref 4.22–5.81)
RDW: 13.8 % (ref 11.5–15.5)
WBC: 3 10*3/uL — ABNORMAL LOW (ref 4.0–10.5)
nRBC: 0 % (ref 0.0–0.2)

## 2024-02-16 LAB — GLUCOSE, CAPILLARY
Glucose-Capillary: 163 mg/dL — ABNORMAL HIGH (ref 70–99)
Glucose-Capillary: 274 mg/dL — ABNORMAL HIGH (ref 70–99)
Glucose-Capillary: 195 mg/dL — ABNORMAL HIGH (ref 70–99)
Glucose-Capillary: 219 mg/dL — ABNORMAL HIGH (ref 70–99)

## 2024-02-16 LAB — CK: Total CK: 669 U/L — ABNORMAL HIGH (ref 49–397)

## 2024-02-16 LAB — MAGNESIUM: Magnesium: 1.8 mg/dL (ref 1.7–2.4)

## 2024-02-16 MED ORDER — SODIUM CHLORIDE 0.9 % IV SOLN: INTRAVENOUS | Status: AC

## 2024-02-16 MED ORDER — SODIUM CHLORIDE 0.9 % IV BOLUS
500.0000 mL | Freq: Once | INTRAVENOUS | Status: AC
  Administered 2024-02-16: 500 mL via INTRAVENOUS

## 2024-02-16 NOTE — Plan of Care (Signed)
   Problem: Education: Goal: Knowledge of risk factors and measures for prevention of condition will improve Outcome: Progressing   Problem: Coping: Goal: Psychosocial and spiritual needs will be supported Outcome: Progressing   Problem: Respiratory: Goal: Will maintain a patent airway Outcome: Progressing

## 2024-02-16 NOTE — Care Management Obs Status (Signed)
 MEDICARE OBSERVATION STATUS NOTIFICATION   Patient Details  Name: Earl [name] MRN: [id number] Date of Birth: 12-22-1940   Medicare Observation Status Notification Given:  Yes    Baldemar Lenis, LCSW 02/16/2024, 2:00 PM

## 2024-02-16 NOTE — Plan of Care (Signed)
   Problem: Education: Goal: Knowledge of risk factors and measures for prevention of condition will improve Outcome: Progressing   Problem: Coping: Goal: Psychosocial and spiritual needs will be supported Outcome: Progressing   Problem: Respiratory: Goal: Will maintain a patent airway Outcome: Progressing Goal: Complications related to the disease process, condition or treatment will be avoided or minimized Outcome: Progressing

## 2024-02-16 NOTE — Telephone Encounter (Signed)
Message routed to PCP [name], MD

## 2024-02-16 NOTE — Plan of Care (Signed)
 Calm and comfortable, meal is tolerated well. No significant changes happen as of this time.   Problem: Education: Goal: Knowledge of risk factors and measures for prevention of condition will improve Outcome: Progressing   Problem: Respiratory: Goal: Will maintain a patent airway Outcome: Progressing   Problem: Education: Goal: Ability to describe self-care measures that may prevent or decrease complications (Diabetes Survival Skills Education) will improve Outcome: Progressing   Problem: Health Behavior/Discharge Planning: Goal: Ability to manage health-related needs will improve Outcome: Progressing   Problem: Nutritional: Goal: Maintenance of adequate nutrition will improve Outcome: Progressing   Problem: Activity: Goal: Risk for activity intolerance will decrease Outcome: Progressing   Problem: Safety: Goal: Ability to remain free from injury will improve Outcome: Progressing

## 2024-02-16 NOTE — Inpatient Diabetes Management (Signed)
 Inpatient Diabetes Program Recommendations  AACE/ADA: New Consensus Statement on Inpatient Glycemic Control  Target Ranges:  Prepandial:   less than 140 mg/dL      Peak postprandial:   less than 180 mg/dL (1-2 hours)      Critically ill patients:  140 - 180 mg/dL    Latest Reference Range & Units 02/14/24 20:28 02/15/24 11:38 02/15/24 16:55 02/15/24 22:07 02/16/24 06:25 02/16/24 12:37  Glucose-Capillary 70 - 99 mg/dL 295 (H) 284 (H) 132 (H) 224 (H) 163 (H) 274 (H)    Review of Glycemic Control  Diabetes history: DM2 Outpatient Diabetes medications: Metformin XR 1000 mg BID Current orders for Inpatient glycemic control: Novolog 0-9 units TID with meals, Novolog 0-5 units QHS  Inpatient Diabetes Program Recommendations:    Diet: Consider adding Carb Mod to Heart Healthy diet if appropriate for patient.  Insulin: If post prandial glucose remains consistently over 180 mg/dl, may want to consider ordering Novolog 2 units TID with meals for meal coverage if patient eats at least 50% of meals.  Thanks, Orlando Penner, RN, MSN, CDCES Diabetes Coordinator Inpatient Diabetes Program [phone] (Team Pager from 8am to 5pm)

## 2024-02-16 NOTE — Evaluation (Signed)
 Physical Therapy Evaluation Patient Details Name: Earl [name] MRN: [id number] DOB: [birth year] Today's Date: 02/16/2024  History of Present Illness  Pt is 83 yo presenting to [facility] on [facility]/26 due to generalized weakness, L lean all day and fall. PMH includes PAF, cardiac amyloidosis, CHF, CVA, DMII, MGUS, HTN, FTT.  Clinical Impression  Pt is presenting below baseline level of functioning. Currently pt is Min A for bed mobility, Mod A for sit to stand and Min A for below functional distances with gait and AD. Currently pt is at a high risk for falls with a R lateral lean and rotation during standing functional activities and poor eccentric control at the bil LE. Due to pt current functional status, home set up and available assistance at home recommending skilled physical therapy services < 3 hours/day in order to address strength, balance and functional mobility to decrease risk for falls, injury, immobility, skin break down and re-hospitalization.          If plan is discharge home, recommend the following: A lot of help with walking and/or transfers;Assistance with cooking/housework;Assist for transportation;Help with stairs or ramp for entrance   Can travel by private vehicle   No    Equipment Recommendations Hospital bed     Functional Status Assessment Patient has had a recent decline in their functional status and demonstrates the ability to make significant improvements in function in a reasonable and predictable amount of time.     Precautions / Restrictions Precautions Precautions: Fall;Other (comment) Recall of Precautions/Restrictions: Intact Precaution/Restrictions Comments: COVID Restrictions Weight Bearing Restrictions Per Provider Order: No      Mobility  Bed Mobility Overal bed mobility: Needs Assistance Bed Mobility: Supine to Sit, Sit to Supine     Supine to sit: Min assist Sit to supine: Min assist   General bed mobility comments: Pt requires use of bed  rails for supine<>sitting, Min A to scoot EOB and Min A for support of trunk while getting back into supine.    Transfers Overall transfer level: Needs assistance Equipment used: Rolling walker (2 wheels) Transfers: Sit to/from Stand Sit to Stand: Mod assist           General transfer comment: Pt requires Mod A for initial momentum to get to standing from sitting EOB due to weakness in bil LE 2x during session. Pt attempted to stand on his own but was unable to clear EOB. Verbal cues for sequencing of hand placement to prevent pulling up from RW. Poor quad control and sequencing with sit to stand demonstrating poor eccentric control and poor body mechanics with decreased trunk flexion.    Ambulation/Gait Ambulation/Gait assistance: Min assist Gait Distance (Feet): 15 Feet Assistive device: Rolling walker (2 wheels) Gait Pattern/deviations: Step-through pattern, Decreased step length - right, Shuffle, Narrow base of support Gait velocity: significantly decreased Gait velocity interpretation: <1.31 ft/sec, indicative of household ambulator   General Gait Details: Very short step through/partial step through gait pattern with narrow base of support pt is able to briefly correct when cued. Pt has slight R lean with R rotation during gait with multiple steps for turns and MIn A to prevent R loss of balance.     Balance Overall balance assessment: Needs assistance Sitting-balance support: Single extremity supported Sitting balance-Leahy Scale: Fair Sitting balance - Comments: no overt LOB sitting EOB. Postural control: Right lateral lean Standing balance support: Bilateral upper extremity supported, Reliant on assistive device for balance, During functional activity Standing balance-Leahy Scale: Poor Standing balance comment: Min  A for balance with R rotation/lean         Pertinent Vitals/Pain Pain Assessment Pain Assessment: No/denies pain    Home Living Family/patient expects  to be discharged to:: Private residence Living Arrangements: Alone Available Help at Discharge: Family;Home health;Available PRN/intermittently Type of Home: House Home Access: Stairs to enter Entrance Stairs-Rails: Right Entrance Stairs-Number of Steps: 3   Home Layout: One level;Other (Comment) (1 step into kitchen and living room but can walk around and doesn't have to use these steps.) Home Equipment: Tub bench;Grab bars - tub/shower;Hand held shower head;BSC/3in1;Rolling Walker (2 wheels);Rollator (4 wheels);Wheelchair - manual Additional Comments: Home instead comes 4 hours 3 days/week. Daughter is at pt house 2 nights/week. Occasionally son can come assist but daughter lives in [place] and Son lives in [place].    Prior Function Prior Level of Function : Independent/Modified Independent;Needs assist;History of Falls (last six months)       Mobility Comments: Pt has been using W/C mostly in the community. In the house pt has been using the RW. Pt son reports 5-6 falls in 6 months. ADLs Comments: Pt reports Mod I for dressing and toileting. Help with cleaning, laundry and meals.     Extremity/Trunk Assessment   Upper Extremity Assessment Upper Extremity Assessment: Generalized weakness    Lower Extremity Assessment Lower Extremity Assessment: Generalized weakness    Cervical / Trunk Assessment Cervical / Trunk Assessment: Kyphotic  Communication   Communication Communication: (P) No apparent difficulties    Cognition Arousal: Alert Behavior During Therapy: WFL for tasks assessed/performed     Following commands: (P) Intact       Cueing Cueing Techniques: (P) Verbal cues     General Comments General comments (skin integrity, edema, etc.): no noted skin issues, pt fatigues quickly.        Assessment/Plan    PT Assessment Patient needs continued PT services  PT Problem List Decreased strength;Decreased mobility;Decreased activity tolerance;Decreased  balance;Decreased cognition;Decreased safety awareness       PT Treatment Interventions DME instruction;Therapeutic exercise;Gait training;Balance training;Stair training;Functional mobility training;Therapeutic activities;Patient/family education;Neuromuscular re-education    PT Goals (Current goals can be found in the Care Plan section)  Acute Rehab PT Goals Patient Stated Goal: To improve strength and mobility to reduce risk for falls. PT Goal Formulation: With patient/family Time For Goal Achievement: 03/01/24 Potential to Achieve Goals: Fair    Frequency Min 1X/week        AM-PAC PT "6 Clicks" Mobility  Outcome Measure Help needed turning from your back to your side while in a flat bed without using bedrails?: A Little Help needed moving from lying on your back to sitting on the side of a flat bed without using bedrails?: A Little Help needed moving to and from a bed to a chair (including a wheelchair)?: A Lot Help needed standing up from a chair using your arms (e.g., wheelchair or bedside chair)?: A Lot Help needed to walk in hospital room?: A Little Help needed climbing 3-5 steps with a railing? : Total 6 Click Score: 14    End of Session Equipment Utilized During Treatment: Gait belt Activity Tolerance: Patient tolerated treatment well;Patient limited by fatigue Patient left: in bed;with bed alarm set;with call bell/phone within reach;with family/visitor present Nurse Communication: Mobility status PT Visit Diagnosis: Other abnormalities of gait and mobility (R26.89);Muscle weakness (generalized) (M62.81);Unsteadiness on feet (R26.81)    Time: 8657-8469 PT Time Calculation (min) (ACUTE ONLY): 62 min   Charges:   PT Evaluation $PT Eval Low  Complexity: 1 Low PT Treatments $Gait Training: 8-22 mins $Therapeutic Activity: 23-37 mins PT General Charges $$ ACUTE PT VISIT: 1 Visit         Harrel Carina, DPT, CLT  Acute Rehabilitation Services Office:  [phone] (Secure chat preferred)   Claudia Desanctis 02/16/2024, 10:33 AM

## 2024-02-16 NOTE — Discharge Instructions (Signed)

## 2024-02-16 NOTE — NC FL2 (Addendum)
 [place] MEDICAID FL2 LEVEL OF CARE FORM     IDENTIFICATION  Patient Name: Earl [name] Birthdate: [birth year] Sex: male Admission Date (Current Location): 02/14/2024  [facility] and [place] Number:  Producer, television/film/video and Address:  [facility], [address], [place], [place] [address]      Provider Number: [id number]  Attending Physician Name and Address:  Rodolph Bong, MD  Relative Name and Phone Number:       Current Level of Care: Hospital Recommended Level of Care: Skilled Nursing Facility Prior Approval Number:    Date Approved/Denied:   PASRR Number: [id number]  Discharge Plan: SNF    Current Diagnoses: Patient Active Problem List   Diagnosis Date Noted   COVID-19 virus infection 02/15/2024   Acute metabolic encephalopathy 11/14/2023   Failure to thrive in adult 11/14/2023   Generalized weakness 11/14/2023   Fall at home, initial encounter 11/14/2023   Muscle spasms of both lower extremities 11/14/2023   Pressure ulcer 11/14/2023   Chronic systolic CHF (congestive heart failure) (HCC) 09/11/2022   NICM (nonischemic cardiomyopathy) (HCC) 11/02/2021   Acute on chronic congestive heart failure (HCC) 07/24/2021   Iron deficiency anemia 07/09/2021   Diabetic 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 (HCC) 05/17/2021   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   Acquired thrombophilia (HCC) 09/09/2019   Abdominal aortic atherosclerosis (HCC) by Abd CTscvn  2016 09/09/2019   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   Chronic anticoagulation 09/04/2017   Ataxia, post-stroke    History of CVA (cerebrovascular accident)    Hypothyroidism 09/13/2016   Vitamin D deficiency 04/29/2014   Non-insulin dependent type 2 diabetes mellitus (HCC)    BPH (benign prostatic hyperplasia)    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   Chronic anemia 11/07/2013   Essential hypertension  11/07/2013   Hyperlipidemia 11/07/2013   Paroxysmal atrial fibrillation (HCC) 09/18/2012    Orientation RESPIRATION BLADDER Height & Weight     Self, Time, Situation, Place  Normal Incontinent Weight: 159 lb (72.1 kg) Height:     BEHAVIORAL SYMPTOMS/MOOD NEUROLOGICAL BOWEL NUTRITION STATUS      Incontinent Diet (Heart)  AMBULATORY STATUS COMMUNICATION OF NEEDS Skin   Limited Assist Verbally Normal                       Personal Care Assistance Level of Assistance  Bathing, Feeding, Dressing Bathing Assistance: Limited assistance Feeding assistance: Limited assistance Dressing Assistance: Limited assistance     Functional Limitations Info             SPECIAL CARE FACTORS FREQUENCY  PT (By licensed PT), OT (By licensed OT)     PT Frequency: 5x/week OT Frequency: 5x/week            Contractures      Additional Factors Info  Code Status, Allergies, Isolation Precautions Code Status Info: Full Allergies Info: Penicillins     Isolation Precautions Info: Airborne/ Contact Precautions/ COVID-19     Current Medications (02/16/2024):  This is the current hospital active medication list Current Facility-Administered Medications  Medication Dose Route Frequency Provider Last Rate Last Admin   0.9 %  sodium chloride infusion   Intravenous Continuous Rodolph Bong, MD 100 mL/hr at 02/16/24 1108 New Bag at 02/16/24 1108   acetaminophen (TYLENOL) tablet 650 mg  650 mg Oral Q6H PRN Lorin Glass, MD   650 mg at 02/15/24 0825   Or  acetaminophen (TYLENOL) suppository 650 mg  650 mg Rectal Q6H PRN Dahal, Binaya, MD       albuterol (PROVENTIL) (2.5 MG/3ML) 0.083% nebulizer solution 2.5 mg  2.5 mg Nebulization Q6H PRN Dahal, Binaya, MD       apixaban (ELIQUIS) tablet 5 mg  5 mg Oral BID Dahal, Binaya, MD   5 mg at 02/16/24 1054   atorvastatin (LIPITOR) tablet 40 mg  40 mg Oral Daily Dahal, Binaya, MD   40 mg at 02/16/24 1054   benzonatate (TESSALON) capsule 100 mg   100 mg Oral TID Lorin Glass, MD   100 mg at 02/16/24 1054   bisacodyl (DULCOLAX) EC tablet 5 mg  5 mg Oral Daily PRN Dahal, Melina Schools, MD   5 mg at 02/16/24 0422   finasteride (PROSCAR) tablet 5 mg  5 mg Oral Daily Dahal, Binaya, MD   5 mg at 02/16/24 1054   hydrALAZINE (APRESOLINE) injection 10 mg  10 mg Intravenous Q6H PRN Dahal, Melina Schools, MD       insulin aspart (novoLOG) injection 0-5 Units  0-5 Units Subcutaneous QHS Dahal, Melina Schools, MD   2 Units at 02/15/24 2218   insulin aspart (novoLOG) injection 0-9 Units  0-9 Units Subcutaneous TID WC Lorin Glass, MD   2 Units at 02/16/24 4166   levothyroxine (SYNTHROID) tablet 25 mcg  25 mcg Oral Q0600 Lorin Glass, MD   25 mcg at 02/16/24 0538   metoprolol succinate (TOPROL-XL) 24 hr tablet 50 mg  50 mg Oral QHS Dahal, Melina Schools, MD   50 mg at 02/15/24 2215   polyethylene glycol (MIRALAX / GLYCOLAX) packet 17 g  17 g Oral Daily PRN Dahal, Melina Schools, MD       sacubitril-valsartan (ENTRESTO) 24-26 mg per tablet  1 tablet Oral BID Dahal, Melina Schools, MD   1 tablet at 02/16/24 1054   Tafamidis CAPS 61 mg- HOME MED  1 capsule Oral Daily Dahal, Melina Schools, MD   61 mg at 02/16/24 1054     Discharge Medications: Please see discharge summary for a list of discharge medications.  Relevant Imaging Results:  Relevant Lab Results:   Additional Information SSN: [id number]  Antion Felipa [place], Student-Social Work

## 2024-02-16 NOTE — TOC Initial Note (Addendum)
 Transition of Care [facility]) - Initial/Assessment Note    Patient Details  Name: Earl [name] MRN: [id number] Date of Birth: [birth year]  [facility] CM/SW Contact:    Baldemar Lenis, LCSW Phone Number: 02/16/2024, 2:02 PM  Clinical Narrative:        CSW spoke with patient and daughter, Earl [name], to discuss recommendation for SNF placement. Patient and daughter in agreement. CSW discussed difficulty with COVID-19 diagnosis and uncertainty with bed offers and availability due to precautions, and patient and daughter indicated understanding. CSW to fax out referral and will follow up with choice list.          UPDATE: CSW spoke with daughter, Earl [name], to provide CMS choice list. Daughter to review to determine choice, CSW to follow.   Expected Discharge Plan: Skilled Nursing Facility Barriers to Discharge: Continued Medical Work up, English as a second language teacher   Patient Goals and CMS Choice Patient states their goals for this hospitalization and ongoing recovery are:: to get better CMS Medicare.gov Compare Post Acute Care list provided to:: Patient Choice offered to / list presented to : Patient [place] ownership interest in [facility].provided to:: Patient    Expected Discharge Plan and Services     Post Acute Care Choice: Skilled Nursing Facility Living arrangements for the past 2 months: Single Family Home                                      Prior Living Arrangements/Services Living arrangements for the past 2 months: Single Family Home Lives with:: Self Patient language and need for interpreter reviewed:: No Do you feel safe going back to the place where you live?: Yes      Need for Family Participation in Patient Care: No (Comment) Care giver support system in place?: No (comment)   Criminal Activity/Legal Involvement Pertinent to Current Situation/Hospitalization: No - Comment as needed  Activities of Daily Living      Permission  Sought/Granted Permission sought to share information with : Facility Medical sales representative, Family Supports Permission granted to share information with : Yes, Verbal Permission Granted  Share Information with NAME: Earl [name]  Permission granted to share info w AGENCY: SNF  Permission granted to share info w Relationship: Children     Emotional Assessment Appearance:: Appears stated age Attitude/Demeanor/Rapport: Engaged Affect (typically observed): Appropriate Orientation: : Oriented to Self, Oriented to Place, Oriented to  Time, Oriented to Situation Alcohol / Substance Use: Not Applicable Psych Involvement: No (comment)  Admission diagnosis:  Generalized weakness [R53.1] Fall, initial encounter [W19.XXXA] COVID-19 virus infection [U07.1] COVID-19 [U07.1] Patient Active Problem List   Diagnosis Date Noted   COVID-19 virus infection 02/15/2024   Acute metabolic encephalopathy 11/14/2023   Failure to thrive in adult 11/14/2023   Generalized weakness 11/14/2023   Fall at home, initial encounter 11/14/2023   Muscle spasms of both lower extremities 11/14/2023   Pressure ulcer 11/14/2023   Chronic systolic CHF (congestive heart failure) (HCC) 09/11/2022   NICM (nonischemic cardiomyopathy) (HCC) 11/02/2021   Acute on chronic congestive heart failure (HCC) 07/24/2021   Iron deficiency anemia 07/09/2021   Diabetic 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 (HCC) 05/17/2021   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   Acquired thrombophilia (HCC) 09/09/2019   Abdominal aortic atherosclerosis (HCC) by Abd CTscvn  2016 09/09/2019   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   Chronic anticoagulation 09/04/2017  Ataxia, post-stroke    History of CVA (cerebrovascular accident)    Hypothyroidism 09/13/2016   Vitamin D deficiency 04/29/2014   Non-insulin dependent type 2 diabetes mellitus (HCC)    BPH (benign prostatic hyperplasia)    MGUS (monoclonal  gammopathy of unknown significance) 11/07/2013   Chronic anemia 11/07/2013   Essential hypertension 11/07/2013   Hyperlipidemia 11/07/2013   Paroxysmal atrial fibrillation (HCC) 09/18/2012   PCP:  [name], MD Pharmacy:   [facility]) - [place], [place] - [address] [facility] [address] [place] [address] [place] [address] Phone: [phone] Fax: [phone]  [facility] [place]), [place] - [address] [place] [address] Phone: [phone] Fax: [phone]  [facility] - [place], [place] - [address] [place] [address] Phone: [phone] Fax: [phone]  [facility] [place], [place] - [address] [place] [address] Phone: [phone] Fax: [phone]  [facility] - [place], [place] - [address] [place] [address] Phone: [phone] Fax: [phone]  [place] [facility] [address] [place] [address] Phone: [phone] Fax: [phone]  [facility] [place], [place] - [address] [place] [address] Phone: [phone] Fax: [phone]  [place] - [address] [place] - [address] [place] [address] Phone: [phone] Fax: [phone]     Social Drivers of Health (SDOH) Social History: SDOH Screenings   Food Insecurity: No Food Insecurity (02/15/2024)  Housing: Low Risk  (02/15/2024)  Transportation Needs: No Transportation Needs (02/15/2024)  Utilities: Not At Risk (02/15/2024)  Depression (PHQ2-9): Low Risk  (10/03/2023)  Financial Resource Strain: Patient Declined (01/16/2024)  Physical Activity: Unknown (01/16/2024)  Social Connections: Moderately Integrated (01/16/2024)  Stress: Stress Concern Present  (01/16/2024)  Tobacco Use: Low Risk  (02/14/2024)   SDOH Interventions:     Readmission Risk Interventions     No data to display

## 2024-02-16 NOTE — Progress Notes (Signed)
 PROGRESS NOTE    Earl [name]  ZOX:[id number] DOB: [birth year] DOA: 02/14/2024 PCP: [name], MD    Chief Complaint  Patient presents with   Weakness   Fever    Brief Narrative:  Patient is a 83 year old gentleman with history of type 2 diabetes, hypertension, hyperlipidemia, CHF, A-fib on Eliquis, history of stroke with residual right-sided weakness, MGUS, GERD, BPH, hypothyroidism brought to the ED by EMS due to worsening generalized weakness.  Patient seen in the ED workup concerning for COVID-19 infection.  CT head MRI brain done negative for any acute abnormalities.  Patient admitted, placed on supportive care.   Assessment & Plan:   Principal Problem:   COVID-19 virus infection Active Problems:   Paroxysmal atrial fibrillation (HCC)   Essential hypertension   Hyperlipidemia   Non-insulin dependent type 2 diabetes mellitus (HCC)   BPH (benign prostatic hyperplasia)   Hypothyroidism   History of CVA (cerebrovascular accident)   Chronic anticoagulation   Diabetic 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 (HCC)   NICM (nonischemic cardiomyopathy) (HCC)   Chronic systolic CHF (congestive heart failure) (HCC)   Syncope, vasovagal  #1 COVID-19 infection -Patient presented with generalized weakness, lethargy, noted to have no significant respiratory symptoms. -Chest x-ray negative for pneumonia. -SARS coronavirus 2 PCR positive, influenza A and B PCR negative. -Respiratory syncytial virus by PCR negative. -Patient with sats of 96 to 100% on room air. -No indication for steroids or antivirals at this time. -Monitor fever curve. -If persistent fevers will check blood cultures, repeat chest x-ray and check a urinalysis and place empirically on antibiotics. -As needed Tylenol and as needed antitussives. -  2.  Hyperkalemia -Patient noted to have a potassium up to 6. -Status post Lokelma. -Potassium currently at 4.1 this morning. -Continue to  hold Aldactone. -Follow-up.  3.  Rhabdomyolysis -Patient with elevated CK levels, felt likely secondary to be in the same position for several hours. -Renal function stable. -Continue to hold diuretics.  4.  Chronic systolic CHF/history of amyloid cardiomyopathy/hypertension/NICM -Last echo, 2021 with EF of 45 to 50%, LV global hypokinesis, moderate concentric LVH, grade 2 DD. -Patient noted to be on Toprol-XL 50 mg daily, Entresto 24/26 mg twice daily, Aldactone 25 mg daily, torsemide 20 mg twice daily prior to admission. -BP borderline. -Continue home regimen Toprol-XL.  BP soft and as such we will hold Entresto.  Continue to hold Aldactone and torsemide. -Patient also noted to be on tafamidis for amyloid cardiomyopathy.  5.  Vasovagal syncope -Patient noted to have a vasovagal episode this morning while sitting on the bedside commode currently back to baseline. -MRI brain done on admission negative for any acute strokes. -2D echo done 08/2023 with EF of 45 to 50%, left ventricular global hypokinesis, grade 2 DD, no severe valvular abnormalities. -Check orthostatics. -BP noted to be soft will place on gentle hydration for the next 24 hours. -Supportive care.  6.  Paroxysmal A-fib -Continue Toprol-XL for rate control. -Eliquis for anticoagulation.  7.  History of stroke with residual right-sided weakness/hyperlipidemia -Continue Eliquis, Lipitor. -PT/OT.  8.  Hypothyroidism -Continue Synthroid.  9.  BPH -Finasteride.  10.  Diabetes mellitus type 2 -Hemoglobin A1c 6.8 on 10/31/2023. -Hold home regimen oral hypoglycemic agents. -SSI.   DVT prophylaxis: Eliquis Code Status: Full Family Communication: Updated patient and daughter at bedside. Disposition: SNF  Status is: Observation The patient will require care spanning > 2 midnights and should be moved to inpatient because: Severity of illness   Consultants:  None  Procedures:  CT head 02/14/2024 Chest x-ray  02/14/2024 MRI brain 02/14/2024   Antimicrobials:  Anti-infectives (From admission, onward)    None         Subjective: Patient lying in bed.  Overall feeling better.  Still with a cough.  Denies any chest pain or significant shortness of breath.  Patient noted to have a vasovagal episode while using the bathroom this morning.  Daughter at bedside.  Objective: Vitals:   02/15/24 2002 02/15/24 2351 02/16/24 0349 02/16/24 1108  BP: 117/74 108/66 119/69 100/63  Pulse: 79 69 78 60  Resp:   20   Temp: 100 F (37.8 C) 99.2 F (37.3 C) (!) 100.7 F (38.2 C) 98.9 F (37.2 C)  TempSrc: Oral Oral Oral Oral  SpO2: 95% 100% 96% 100%  Weight:        Intake/Output Summary (Last 24 hours) at 02/16/2024 1640 Last data filed at 02/16/2024 1500 Gross per 24 hour  Intake --  Output 1950 ml  Net -1950 ml   Filed Weights   02/14/24 1952  Weight: 72.1 kg    Examination:  General exam: Appears calm and comfortable  Respiratory system: Clear to auscultation anterior lung fields.  No wheezes, no crackles, no rhonchi. Respiratory effort normal. Cardiovascular system: S1 & S2 heard, RRR. No JVD, murmurs, rubs, gallops or clicks. No pedal edema. Gastrointestinal system: Abdomen is nondistended, soft and nontender. No organomegaly or masses felt. Normal bowel sounds heard. Central nervous system: Alert and oriented.  Right upper extremity weakness close to baseline.  Extremities: Symmetric 5 x 5 power. Skin: No rashes, lesions or ulcers Psychiatry: Judgement and insight appear normal. Mood & affect appropriate.     Data Reviewed: I have personally reviewed following labs and imaging studies  CBC: Recent Labs  Lab 02/14/24 2035 02/14/24 2342 02/16/24 0712  WBC 5.3  --  3.0*  HGB 11.9* 11.2* 12.9*  HCT 35.1* 33.0* 37.5*  MCV 88.4  --  86.6  PLT 149*  --  165    Basic Metabolic Panel: Recent Labs  Lab 02/14/24 2245 02/14/24 2342 02/15/24 1400 02/16/24 0711 02/16/24 0712   NA 138 137 138  --  136  K 5.2* 6.0* 4.0  --  4.1  CL 109 108 108  --  103  CO2 20*  --  22  --  21*  GLUCOSE 277* 248* 200*  --  181*  BUN 17 20 12   --  14  CREATININE 0.96 0.80 1.09  --  0.99  CALCIUM 8.5*  --  8.1*  --  8.5*  MG  --   --   --  1.8  --     GFR: Estimated Creatinine Clearance: 58.7 mL/min (by C-G formula based on SCr of 0.99 mg/dL).  Liver Function Tests: Recent Labs  Lab 02/14/24 2245  AST 54*  ALT 29  ALKPHOS 53  BILITOT 1.2  PROT 5.6*  ALBUMIN 3.1*    CBG: Recent Labs  Lab 02/15/24 1138 02/15/24 1655 02/15/24 2207 02/16/24 0625 02/16/24 1237  GLUCAP 287* 161* 224* 163* 274*     Recent Results (from the past 240 hours)  Resp panel by RT-PCR (RSV, Flu A&B, Covid) Anterior Nasal Swab     Status: Abnormal   Collection Time: 02/14/24  8:34 PM   Specimen: Anterior Nasal Swab  Result Value Ref Range Status   SARS Coronavirus 2 by RT PCR POSITIVE (A) NEGATIVE Final   Influenza A by PCR NEGATIVE NEGATIVE Final  Influenza B by PCR NEGATIVE NEGATIVE Final    Comment: (NOTE) The Xpert Xpress SARS-CoV-2/FLU/RSV plus assay is intended as an aid in the diagnosis of influenza from Nasopharyngeal swab specimens and should not be used as a sole basis for treatment. Nasal washings and aspirates are unacceptable for Xpert Xpress SARS-CoV-2/FLU/RSV testing.  Fact Sheet for Patients: BloggerCourse.com  Fact Sheet for Healthcare Providers: SeriousBroker.it  This test is not yet approved or cleared by the [place] FDA and has been authorized for detection and/or diagnosis of SARS-CoV-2 by FDA under an Emergency Use Authorization (EUA). This EUA will remain in effect (meaning this test can be used) for the duration of the COVID-19 declaration under Section 564(b)(1) of the Act, 21 U.S.C. section 360bbb-3(b)(1), unless the authorization is terminated or revoked.     Resp Syncytial Virus by PCR  NEGATIVE NEGATIVE Final    Comment: (NOTE) Fact Sheet for Patients: BloggerCourse.com  Fact Sheet for Healthcare Providers: SeriousBroker.it  This test is not yet approved or cleared by the [place] FDA and has been authorized for detection and/or diagnosis of SARS-CoV-2 by FDA under an Emergency Use Authorization (EUA). This EUA will remain in effect (meaning this test can be used) for the duration of the COVID-19 declaration under Section 564(b)(1) of the Act, 21 U.S.C. section 360bbb-3(b)(1), unless the authorization is terminated or revoked.  Performed at [facility], [address], [place], [place] [address]          Radiology Studies: MR BRAIN WO CONTRAST Result Date: 02/15/2024 CLINICAL DATA:  difficulty speaking, leaning to right EXAM: MRI HEAD WITHOUT CONTRAST TECHNIQUE: Multiplanar, multiecho pulse sequences of the brain and surrounding structures were obtained without intravenous contrast. COMPARISON:  Same day CT head. FINDINGS: Brain: No acute infarction, hemorrhage, hydrocephalus, extra-axial collection or mass lesion. Remote right cerebellar infarct. Vascular: Major arterial flow voids are maintained at the skull base. Skull and upper cervical spine: Normal marrow signal. Sinuses/Orbits: Negative. Other: No mastoid effusions. IMPRESSION: 1. No evidence of acute intracranial abnormality. 2. Remote right cerebellar infarct. Electronically Signed   By: Feliberto Harts M.D.   On: 02/15/2024 01:18   DG Chest Portable 1 View Result Date: 02/14/2024 CLINICAL DATA:  Fever and weakness EXAM: PORTABLE CHEST 1 VIEW COMPARISON:  07/23/2021 FINDINGS: Heart size and pulmonary vascularity are normal. Lungs are clear. No pleural effusion or pneumothorax. Mediastinal contours appear intact. IMPRESSION: No active disease. Electronically Signed   By: Burman Nieves M.D.   On: 02/14/2024 21:46   CT Head Wo Contrast Result  Date: 02/14/2024 CLINICAL DATA:  Neuro deficit, acute, stroke suspected. Leaning to left side all day with overall weakness. Fall today. EXAM: CT HEAD WITHOUT CONTRAST TECHNIQUE: Contiguous axial images were obtained from the base of the skull through the vertex without intravenous contrast. RADIATION DOSE REDUCTION: This exam was performed according to the departmental dose-optimization program which includes automated exposure control, adjustment of the mA and/or kV according to patient size and/or use of iterative reconstruction technique. COMPARISON:  11/14/2023. FINDINGS: Brain: No acute intracranial hemorrhage, midline shift or mass effect. No extra-axial fluid collection. Mild periventricular white matter hypodensities are present bilaterally. There is a encephalomalacia in the right cerebellar hemisphere. No hydrocephalus is seen. Vascular: No hyperdense vessel or unexpected calcification. Skull: Normal. Negative for fracture or focal lesion. Sinuses/Orbits: Mucosal thickening is noted in the ethmoid air cells bilaterally. There is calcification in the region of the optic nerves in the posterior lobes, compatible with optic drusen. No  acute orbital abnormality. Other: None. IMPRESSION: 1. No acute intracranial process. 2. Atrophy with chronic microvascular ischemic changes. 3. Chronic right cerebellar infarct. Electronically Signed   By: Thornell Sartorius M.D.   On: 02/14/2024 21:41        Scheduled Meds:  apixaban  5 mg Oral BID   atorvastatin  40 mg Oral Daily   benzonatate  100 mg Oral TID   finasteride  5 mg Oral Daily   insulin aspart  0-5 Units Subcutaneous QHS   insulin aspart  0-9 Units Subcutaneous TID WC   levothyroxine  25 mcg Oral Q0600   metoprolol succinate  50 mg Oral QHS   Tafamidis  1 capsule Oral Daily   Continuous Infusions:  sodium chloride 100 mL/hr at 02/16/24 1108     LOS: 0 days    Time spent: 40 minutes    Ramiro Harvest, MD [facility]   To  contact the attending provider between 7A-7P or the covering provider during after hours 7P-7A, please log into the web site [email or web address] and access using universal [place] password for that web site. If you do not have the password, please call the hospital operator.  02/16/2024, 4:40 PM

## 2024-02-17 DIAGNOSIS — I5022 Chronic systolic (congestive) heart failure: Secondary | ICD-10-CM | POA: Diagnosis not present

## 2024-02-17 DIAGNOSIS — R55 Syncope and collapse: Secondary | ICD-10-CM | POA: Diagnosis not present

## 2024-02-17 DIAGNOSIS — E785 Hyperlipidemia, unspecified: Secondary | ICD-10-CM | POA: Diagnosis not present

## 2024-02-17 DIAGNOSIS — U071 COVID-19: Secondary | ICD-10-CM | POA: Diagnosis not present

## 2024-02-17 LAB — GLUCOSE, CAPILLARY
Glucose-Capillary: 114 mg/dL — ABNORMAL HIGH (ref 70–99)
Glucose-Capillary: 267 mg/dL — ABNORMAL HIGH (ref 70–99)
Glucose-Capillary: 210 mg/dL — ABNORMAL HIGH (ref 70–99)
Glucose-Capillary: 253 mg/dL — ABNORMAL HIGH (ref 70–99)

## 2024-02-17 LAB — RENAL FUNCTION PANEL
Albumin: 2.5 g/dL — ABNORMAL LOW (ref 3.5–5.0)
Anion gap: 8 (ref 5–15)
BUN: 15 mg/dL (ref 8–23)
CO2: 22 mmol/L (ref 22–32)
Calcium: 8 mg/dL — ABNORMAL LOW (ref 8.9–10.3)
Chloride: 109 mmol/L (ref 98–111)
Creatinine, Ser: 0.91 mg/dL (ref 0.61–1.24)
GFR, Estimated: >60 mL/min (ref >60)
Glucose, Bld: 118 mg/dL — ABNORMAL HIGH (ref 70–99)
Phosphorus: 3.2 mg/dL (ref 2.5–4.6)
Potassium: 3.9 mmol/L (ref 3.5–5.1)
Sodium: 139 mmol/L (ref 135–145)

## 2024-02-17 LAB — CBC
HCT: 33.9 % — ABNORMAL LOW (ref 39.0–52.0)
Hemoglobin: 11.6 g/dL — ABNORMAL LOW (ref 13.0–17.0)
MCH: 29.7 pg (ref 26.0–34.0)
MCHC: 34.2 g/dL (ref 30.0–36.0)
MCV: 86.7 fL (ref 80.0–100.0)
Platelets: 128 10*3/uL — ABNORMAL LOW (ref 150–400)
RBC: 3.91 MIL/uL — ABNORMAL LOW (ref 4.22–5.81)
RDW: 14 % (ref 11.5–15.5)
WBC: 2.9 10*3/uL — ABNORMAL LOW (ref 4.0–10.5)
nRBC: 0 % (ref 0.0–0.2)

## 2024-02-17 LAB — MAGNESIUM: Magnesium: 1.7 mg/dL (ref 1.7–2.4)

## 2024-02-17 MED ORDER — MAGNESIUM SULFATE 2 GM/50ML IV SOLN
2.0000 g | Freq: Once | INTRAVENOUS | Status: AC
Start: 2024-02-17 — End: 2024-02-17
  Administered 2024-02-17: 2 g via INTRAVENOUS
  Filled 2024-02-17: qty 50

## 2024-02-17 NOTE — Progress Notes (Signed)
 PROGRESS NOTE    Earl [name]  MWU:[id number] DOB: [birth year] DOA: 02/14/2024 PCP: [name], MD    Chief Complaint  Patient presents with   Weakness   Fever    Brief Narrative:  Patient is a 83 year old gentleman with history of type 2 diabetes, hypertension, hyperlipidemia, CHF, A-fib on Eliquis, history of stroke with residual right-sided weakness, MGUS, GERD, BPH, hypothyroidism brought to the ED by EMS due to worsening generalized weakness.  Patient seen in the ED workup concerning for COVID-19 infection.  CT head MRI brain done negative for any acute abnormalities.  Patient admitted, placed on supportive care.   Assessment & Plan:   Principal Problem:   COVID-19 virus infection Active Problems:   Paroxysmal atrial fibrillation (HCC)   Essential hypertension   Hyperlipidemia   Non-insulin dependent type 2 diabetes mellitus (HCC)   BPH (benign prostatic hyperplasia)   Hypothyroidism   History of CVA (cerebrovascular accident)   Chronic anticoagulation   Diabetic 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 (HCC)   NICM (nonischemic cardiomyopathy) (HCC)   Chronic systolic CHF (congestive heart failure) (HCC)   Syncope, vasovagal  #1 COVID-19 infection -Patient presented with generalized weakness, lethargy, noted to have no significant respiratory symptoms. -Chest x-ray negative for pneumonia. -SARS coronavirus 2 PCR positive, influenza A and B PCR negative. -Respiratory syncytial virus by PCR negative. -Patient with sats of 97 to 100% on room air. -No indication for steroids or antivirals at this time. -Monitor fever curve. -If persistent fevers will check blood cultures, repeat chest x-ray and check a urinalysis and place empirically on antibiotics. -As needed Tylenol and as needed antitussives. -  2.  Hyperkalemia -Patient noted to have a potassium up to 6. -Status post Lokelma. -Potassium currently at 3.9 this morning. -Continue to  hold Aldactone. -Follow-up.  3.  Rhabdomyolysis -Patient with elevated CK levels, felt likely secondary to be in the same position for several hours. -Renal function stable. -Continue to hold diuretics.  4.  Chronic systolic CHF/history of amyloid cardiomyopathy/hypertension/NICM -Last echo, 2021 with EF of 45 to 50%, LV global hypokinesis, moderate concentric LVH, grade 2 DD. -Patient noted to be on Toprol-XL 50 mg daily, Entresto 24/26 mg twice daily, Aldactone 25 mg daily, torsemide 20 mg twice daily prior to admission. -BP borderline. -Continue home regimen Toprol-XL.  BP soft and as such Entresto currently on hold.   -Continue to hold Aldactone and torsemide.   -If BP continues to improve could likely resume home regimen Entresto in the next 24 hours.  -Patient also noted to be on tafamidis for amyloid cardiomyopathy.  5.  Vasovagal syncope -Patient noted to have a vasovagal episode, 02/16/2024, while sitting on the bedside commode currently back to baseline. -MRI brain done on admission negative for any acute strokes. -2D echo done 08/2023 with EF of 45 to 50%, left ventricular global hypokinesis, grade 2 DD, no severe valvular abnormalities. -BP noted to be soft on 02/16/2024 and assessed patient hydrated gently with IV fluids.   -BP improved.  -Supportive care.  6.  Paroxysmal A-fib -Continue Toprol-XL for rate control. -Eliquis for anticoagulation.  7.  History of stroke with residual right-sided weakness/hyperlipidemia -Continue Eliquis, Lipitor. -PT/OT.  8.  Hypothyroidism -Synthroid.    9.  BPH -Finasteride.  10.  Diabetes mellitus type 2 -Hemoglobin A1c 6.8 on 10/31/2023. -CBG 114 this morning. -Continue hold home regimen oral hypoglycemic agents. -SSI.   DVT prophylaxis: Eliquis Code Status: Full Family Communication: Updated patient and daughter at bedside. Disposition:  SNF  Status is: Observation The patient will require care spanning > 2 midnights  and should be moved to inpatient because: Severity of illness   Consultants:  None  Procedures:  CT head 02/14/2024 Chest x-ray 02/14/2024 MRI brain 02/14/2024   Antimicrobials:  Anti-infectives (From admission, onward)    None         Subjective: Patient laying in bed, daughter at bedside.  Denies any chest pain or significant shortness of breath.  Overall feeling a little bit better than he did on admission.  Tolerating current diet.  No further syncopal episodes.   Objective: Vitals:   02/16/24 1931 02/16/24 2308 02/17/24 0500 02/17/24 0855  BP: (!) 122/94 (!) 105/57 114/70 114/68  Pulse: 71 (!) 57 (!) 57 71  Resp:  18    Temp: 98.7 F (37.1 C) 99.6 F (37.6 C) 99.5 F (37.5 C) 99.5 F (37.5 C)  TempSrc: Oral Axillary Axillary   SpO2: 99% 99% 99% 97%  Weight:        Intake/Output Summary (Last 24 hours) at 02/17/2024 1116 Last data filed at 02/17/2024 0816 Gross per 24 hour  Intake 1000 ml  Output 4000 ml  Net -3000 ml   Filed Weights   02/14/24 1952  Weight: 72.1 kg    Examination:  General exam: NAD Respiratory system: CTAB anterior lung fields.  No wheezes, no crackles, no rhonchi.  Fair air movement.   Cardiovascular system: Regular rate rhythm no murmurs rubs or gallops.  No JVD.  No pitting lower extremity edema. Gastrointestinal system: Abdomen is soft, nontender, nondistended, positive bowel sounds.  No rebound.  No guarding.  Central nervous system: Alert and oriented.  Right upper extremity weakness close to baseline.  Extremities: Symmetric 5 x 5 power. Skin: No rashes, lesions or ulcers Psychiatry: Judgement and insight appear normal. Mood & affect appropriate.     Data Reviewed: I have personally reviewed following labs and imaging studies  CBC: Recent Labs  Lab 02/14/24 2035 02/14/24 2342 02/16/24 0712 02/17/24 0540  WBC 5.3  --  3.0* 2.9*  HGB 11.9* 11.2* 12.9* 11.6*  HCT 35.1* 33.0* 37.5* 33.9*  MCV 88.4  --  86.6 86.7  PLT  149*  --  165 128*    Basic Metabolic Panel: Recent Labs  Lab 02/14/24 2245 02/14/24 2342 02/15/24 1400 02/16/24 0711 02/16/24 0712 02/17/24 0540  NA 138 137 138  --  136 139  K 5.2* 6.0* 4.0  --  4.1 3.9  CL 109 108 108  --  103 109  CO2 20*  --  22  --  21* 22  GLUCOSE 277* 248* 200*  --  181* 118*  BUN 17 20 12   --  14 15  CREATININE 0.96 0.80 1.09  --  0.99 0.91  CALCIUM 8.5*  --  8.1*  --  8.5* 8.0*  MG  --   --   --  1.8  --  1.7  PHOS  --   --   --   --   --  3.2    GFR: Estimated Creatinine Clearance: 63.8 mL/min (by C-G formula based on SCr of 0.91 mg/dL).  Liver Function Tests: Recent Labs  Lab 02/14/24 2245 02/17/24 0540  AST 54*  --   ALT 29  --   ALKPHOS 53  --   BILITOT 1.2  --   PROT 5.6*  --   ALBUMIN 3.1* 2.5*    CBG: Recent Labs  Lab 02/16/24 0625 02/16/24 1237  02/16/24 1651 02/16/24 2125 02/17/24 0616  GLUCAP 163* 274* 195* 219* 114*     Recent Results (from the past 240 hours)  Resp panel by RT-PCR (RSV, Flu A&B, Covid) Anterior Nasal Swab     Status: Abnormal   Collection Time: 02/14/24  8:34 PM   Specimen: Anterior Nasal Swab  Result Value Ref Range Status   SARS Coronavirus 2 by RT PCR POSITIVE (A) NEGATIVE Final   Influenza A by PCR NEGATIVE NEGATIVE Final   Influenza B by PCR NEGATIVE NEGATIVE Final    Comment: (NOTE) The Xpert Xpress SARS-CoV-2/FLU/RSV plus assay is intended as an aid in the diagnosis of influenza from Nasopharyngeal swab specimens and should not be used as a sole basis for treatment. Nasal washings and aspirates are unacceptable for Xpert Xpress SARS-CoV-2/FLU/RSV testing.  Fact Sheet for Patients: BloggerCourse.com  Fact Sheet for Healthcare Providers: SeriousBroker.it  This test is not yet approved or cleared by the [place] FDA and has been authorized for detection and/or diagnosis of SARS-CoV-2 by FDA under an Emergency Use Authorization  (EUA). This EUA will remain in effect (meaning this test can be used) for the duration of the COVID-19 declaration under Section 564(b)(1) of the Act, 21 U.S.C. section 360bbb-3(b)(1), unless the authorization is terminated or revoked.     Resp Syncytial Virus by PCR NEGATIVE NEGATIVE Final    Comment: (NOTE) Fact Sheet for Patients: BloggerCourse.com  Fact Sheet for Healthcare Providers: SeriousBroker.it  This test is not yet approved or cleared by the [place] FDA and has been authorized for detection and/or diagnosis of SARS-CoV-2 by FDA under an Emergency Use Authorization (EUA). This EUA will remain in effect (meaning this test can be used) for the duration of the COVID-19 declaration under Section 564(b)(1) of the Act, 21 U.S.C. section 360bbb-3(b)(1), unless the authorization is terminated or revoked.  Performed at [facility], [address] [place], [place] [address]          Radiology Studies: No results found.       Scheduled Meds:  apixaban  5 mg Oral BID   atorvastatin  40 mg Oral Daily   benzonatate  100 mg Oral TID   finasteride  5 mg Oral Daily   insulin aspart  0-5 Units Subcutaneous QHS   insulin aspart  0-9 Units Subcutaneous TID WC   levothyroxine  25 mcg Oral Q0600   metoprolol succinate  50 mg Oral QHS   Tafamidis  1 capsule Oral Daily   Continuous Infusions:  magnesium sulfate bolus IVPB       LOS: 1 day    Time spent: 35 minutes    Ramiro Harvest, MD [facility]   To contact the attending provider between 7A-7P or the covering provider during after hours 7P-7A, please log into the web site [email or web address] and access using universal [place] password for that web site. If you do not have the password, please call the hospital operator.  02/17/2024, 11:16 AM

## 2024-02-17 NOTE — Evaluation (Signed)
 Occupational Therapy Evaluation Patient Details Name: Earl [name] MRN: [id number] DOB: March 12, 1941 Today's Date: 02/17/2024   History of Present Illness   Pt is 83 yo presenting to [facility] on 3/26 due to generalized weakness, L lean all day and fall. PMH includes PAF, cardiac amyloidosis, CHF, CVA, DMII, MGUS, HTN, FTT.     Clinical Impressions Pt evaluated s/p the admission list above. At baseline, pt lives at home alone but has caregivers that assists with IADLs 4hrs 3days per week. Upon evaluation, pt was limited by deficits listed below. Based on evaluation, pt requires up to MOD A +2 for safety for functional mobility tasks using RW and up to MOD A for ADLs. OT will continue following pt acutely to address functional needs with discharge recommendations of inpatient therapy services <3hrs/day to maximize functional independence.      If plan is discharge home, recommend the following:   A lot of help with walking and/or transfers;A lot of help with bathing/dressing/bathroom;Assistance with cooking/housework;Assist for transportation;Help with stairs or ramp for entrance     Functional Status Assessment   Patient has had a recent decline in their functional status and demonstrates the ability to make significant improvements in function in a reasonable and predictable amount of time.     Equipment Recommendations   Other (comment) (defer)     Recommendations for Other Services         Precautions/Restrictions   Precautions Precautions: Fall;Other (comment) Recall of Precautions/Restrictions: Intact Precaution/Restrictions Comments: COVID Restrictions Weight Bearing Restrictions Per Provider Order: No     Mobility Bed Mobility Overal bed mobility: Needs Assistance Bed Mobility: Supine to Sit, Sit to Supine     Supine to sit: Mod assist, HOB elevated, Used rails Sit to supine: Mod assist, HOB elevated, Used rails   General bed mobility comments: Increased  required, verbal cues provided for sequencing. Pt required assistance facilitating trunk to upright position and scooting forward until B feet were flat on the floot    Transfers Overall transfer level: Needs assistance Equipment used: Rolling walker (2 wheels) Transfers: Sit to/from Stand Sit to Stand: Mod assist           General transfer comment: Verbal cues provided for B hand/foot placement and sequencing.      Balance Overall balance assessment: Needs assistance Sitting-balance support: Bilateral upper extremity supported, Feet supported Sitting balance-Leahy Scale: Fair Sitting balance - Comments: static sitting EOB Postural control: Right lateral lean Standing balance support: Bilateral upper extremity supported, Reliant on assistive device for balance, During functional activity Standing balance-Leahy Scale: Poor Standing balance comment: Pt reliant on AD for balance. R lateral lean while standing supported                           ADL either performed or assessed with clinical judgement   ADL Overall ADL's : Needs assistance/impaired Eating/Feeding: Independent;Sitting   Grooming: Set up;Sitting   Upper Body Bathing: Set up;Sitting   Lower Body Bathing: Moderate assistance;Sit to/from stand;Sitting/lateral leans   Upper Body Dressing : Set up;Sitting   Lower Body Dressing: Moderate assistance;Sit to/from stand   Toilet Transfer: Moderate assistance;Ambulation;Rolling walker (2 wheels);Regular Toilet;+2 for safety/equipment   Toileting- Clothing Manipulation and Hygiene: Moderate assistance;Sit to/from stand       Functional mobility during ADLs: Moderate assistance;Rolling walker (2 wheels);+2 for safety/equipment General ADL Comments: Pt observed with R lateral lean while seated and standing supported at RW. Pt required verbal cues for sequencing  and physical assistance for advacement of RLE during functional mobility.     Vision Baseline  Vision/History: 1 Wears glasses Ability to See in Adequate Light: 0 Adequate Patient Visual Report: No change from baseline Vision Assessment?: No apparent visual deficits     Perception Perception: Not tested       Praxis Praxis: Not tested       Pertinent Vitals/Pain Pain Assessment Pain Assessment: No/denies pain     Extremity/Trunk Assessment Upper Extremity Assessment Upper Extremity Assessment: Generalized weakness   Lower Extremity Assessment Lower Extremity Assessment: Defer to PT evaluation   Cervical / Trunk Assessment Cervical / Trunk Assessment: Kyphotic   Communication Communication Communication: No apparent difficulties   Cognition Arousal: Alert Behavior During Therapy: WFL for tasks assessed/performed Cognition: No apparent impairments                               Following commands: Intact       Cueing  General Comments   Cueing Techniques: Verbal cues  VSS on RA   Exercises     Shoulder Instructions      Home Living Family/patient expects to be discharged to:: Private residence Living Arrangements: Alone Available Help at Discharge: Family;Home health;Available PRN/intermittently Type of Home: House Home Access: Stairs to enter [employer] of Steps: 3 Entrance Stairs-Rails: Right Home Layout: One level;Other (Comment)     Bathroom Shower/Tub: Chief Strategy Officer: Standard Bathroom Accessibility: Yes   Home Equipment: Tub bench;Grab bars - tub/shower;Hand held shower head;BSC/3in1;Rolling Walker (2 wheels);Rollator (4 wheels);Wheelchair - manual   Additional Comments: Home instead comes 4 hours 3 days/week. Daughter is at pt house 2 nights/week. Occasionally son can come assist but daughter lives in [place] and Son lives in [place].      Prior Functioning/Environment Prior Level of Function : Independent/Modified Independent;Needs assist;History of Falls (last six months)        Physical Assist : ADLs (physical)     Mobility Comments: Pt has been using W/C mostly in the community. In the house pt has been using the RW. Pt son reports 5-6 falls in 6 months. ADLs Comments: Pt reports Mod I for dressing and toileting. Help with cleaning, laundry and meals.    OT Problem List: Decreased strength;Decreased range of motion;Decreased activity tolerance;Impaired balance (sitting and/or standing);Decreased knowledge of use of DME or AE;Decreased safety awareness   OT Treatment/Interventions: Self-care/ADL training;Therapeutic exercise;Energy conservation;DME and/or AE instruction;Therapeutic activities;Patient/family education;Balance training      OT Goals(Current goals can be found in the care plan section)   Acute Rehab OT Goals Patient Stated Goal: to walk OT Goal Formulation: With patient Time For Goal Achievement: 03/02/24 Potential to Achieve Goals: Good ADL Goals Pt Will Perform Grooming: with contact guard assist;standing Pt Will Perform Upper Body Dressing: with supervision;sitting Pt Will Perform Lower Body Dressing: sit to/from stand;with contact guard assist Pt Will Transfer to Toilet: with contact guard assist;ambulating;regular height toilet Pt Will Perform Toileting - Clothing Manipulation and hygiene: sit to/from stand;with contact guard assist   OT Frequency:  Min 2X/week    Co-evaluation              AM-PAC OT "6 Clicks" Daily Activity     Outcome Measure Help from another person eating meals?: None Help from another person taking care of personal grooming?: A Little Help from another person toileting, which includes using toliet, bedpan, or urinal?: A Lot Help from  another person bathing (including washing, rinsing, drying)?: A Lot Help from another person to put on and taking off regular upper body clothing?: A Little Help from another person to put on and taking off regular lower body clothing?: A Lot 6 Click Score: 16   End of  Session Equipment Utilized During Treatment: Gait belt;Rolling walker (2 wheels) Nurse Communication: Mobility status  Activity Tolerance: Patient tolerated treatment well Patient left: in bed;with call bell/phone within reach;with nursing/sitter in room  OT Visit Diagnosis: Unsteadiness on feet (R26.81);Other abnormalities of gait and mobility (R26.89);Muscle weakness (generalized) (M62.81);History of falling (Z91.81)                Time: 1641 (1710)-1710 OT Time Calculation (min): 29 min Charges:  OT General Charges $OT Visit: 1 Visit OT Evaluation $OT Eval Moderate Complexity: 1 Mod OT Treatments $Self Care/Home Management : 8-22 mins Kevan Ny, Darliss Cheney 02/17/2024, 5:58 PM

## 2024-02-17 NOTE — Plan of Care (Signed)
  Problem: Respiratory: Goal: Will maintain a patent airway Outcome: Progressing Goal: Complications related to the disease process, condition or treatment will be avoided or minimized Outcome: Progressing   Problem: Coping: Goal: Ability to adjust to condition or change in health will improve Outcome: Progressing   Problem: Fluid Volume: Goal: Ability to maintain a balanced intake and output will improve Outcome: Progressing   Problem: Health Behavior/Discharge Planning: Goal: Ability to manage health-related needs will improve Outcome: Not Progressing   Problem: Activity: Goal: Risk for activity intolerance will decrease Outcome: Not Progressing

## 2024-02-18 DIAGNOSIS — U071 COVID-19: Secondary | ICD-10-CM | POA: Diagnosis not present

## 2024-02-18 DIAGNOSIS — E785 Hyperlipidemia, unspecified: Secondary | ICD-10-CM | POA: Diagnosis not present

## 2024-02-18 DIAGNOSIS — R55 Syncope and collapse: Secondary | ICD-10-CM | POA: Diagnosis not present

## 2024-02-18 DIAGNOSIS — I5022 Chronic systolic (congestive) heart failure: Secondary | ICD-10-CM | POA: Diagnosis not present

## 2024-02-18 LAB — BASIC METABOLIC PANEL WITH GFR
Anion gap: 10 (ref 5–15)
BUN: 14 mg/dL (ref 8–23)
CO2: 22 mmol/L (ref 22–32)
Calcium: 7.9 mg/dL — ABNORMAL LOW (ref 8.9–10.3)
Chloride: 104 mmol/L (ref 98–111)
Creatinine, Ser: 0.87 mg/dL (ref 0.61–1.24)
GFR, Estimated: >60 mL/min (ref >60)
Glucose, Bld: 176 mg/dL — ABNORMAL HIGH (ref 70–99)
Potassium: 3.8 mmol/L (ref 3.5–5.1)
Sodium: 136 mmol/L (ref 135–145)

## 2024-02-18 LAB — GLUCOSE, CAPILLARY
Glucose-Capillary: 192 mg/dL — ABNORMAL HIGH (ref 70–99)
Glucose-Capillary: 413 mg/dL — ABNORMAL HIGH (ref 70–99)
Glucose-Capillary: 242 mg/dL — ABNORMAL HIGH (ref 70–99)
Glucose-Capillary: 217 mg/dL — ABNORMAL HIGH (ref 70–99)

## 2024-02-18 LAB — CBC
HCT: 35.5 % — ABNORMAL LOW (ref 39.0–52.0)
Hemoglobin: 12.5 g/dL — ABNORMAL LOW (ref 13.0–17.0)
MCH: 30 pg (ref 26.0–34.0)
MCHC: 35.2 g/dL (ref 30.0–36.0)
MCV: 85.1 fL (ref 80.0–100.0)
Platelets: 133 10*3/uL — ABNORMAL LOW (ref 150–400)
RBC: 4.17 MIL/uL — ABNORMAL LOW (ref 4.22–5.81)
RDW: 13.7 % (ref 11.5–15.5)
WBC: 3.1 10*3/uL — ABNORMAL LOW (ref 4.0–10.5)
nRBC: 0 % (ref 0.0–0.2)

## 2024-02-18 LAB — GLUCOSE, RANDOM: Glucose, Bld: 395 mg/dL — ABNORMAL HIGH (ref 70–99)

## 2024-02-18 LAB — MAGNESIUM: Magnesium: 1.9 mg/dL (ref 1.7–2.4)

## 2024-02-18 MED ORDER — ACETAMINOPHEN 650 MG RE SUPP: 650.0000 mg | Freq: Four times a day (QID) | RECTAL | Status: DC | PRN

## 2024-02-18 MED ORDER — MAGNESIUM OXIDE -MG SUPPLEMENT 400 (240 MG) MG PO TABS
200.0000 mg | ORAL_TABLET | Freq: Every day | ORAL | Status: DC
  Administered 2024-02-18 – 2024-02-29 (×12): 200 mg via ORAL
  Filled 2024-02-18 (×12): qty 1

## 2024-02-18 MED ORDER — INSULIN ASPART 100 UNIT/ML IJ SOLN: 6.0000 [IU] | Freq: Once | INTRAMUSCULAR | Status: DC

## 2024-02-18 MED ORDER — MAGNESIUM 250 MG PO TABS
250.0000 mg | ORAL_TABLET | Freq: Every day | ORAL | Status: DC
Start: 2024-02-18 — End: 2024-02-18

## 2024-02-18 MED ORDER — B-12 50 MCG PO TABS: 50.0000 ug | ORAL_TABLET | Freq: Every day | ORAL | Status: DC

## 2024-02-18 MED ORDER — BACLOFEN 5 MG/5ML PO SOLN
2.5000 mg | Freq: Two times a day (BID) | ORAL | Status: DC
  Filled 2024-02-18: qty 2.5

## 2024-02-18 MED ORDER — ACETAMINOPHEN 325 MG PO TABS
650.0000 mg | ORAL_TABLET | Freq: Four times a day (QID) | ORAL | Status: DC | PRN
  Administered 2024-02-18 – 2024-02-29 (×10): 650 mg via ORAL
  Filled 2024-02-18 (×10): qty 2

## 2024-02-18 MED ORDER — VITAMIN B-12 100 MCG PO TABS
50.0000 ug | ORAL_TABLET | Freq: Every day | ORAL | Status: DC
Start: 2024-02-18 — End: 2024-02-29
  Administered 2024-02-18 – 2024-02-29 (×12): 50 ug via ORAL
  Filled 2024-02-18 (×12): qty 1

## 2024-02-18 MED ORDER — INSULIN ASPART 100 UNIT/ML IJ SOLN
11.0000 [IU] | Freq: Once | INTRAMUSCULAR | Status: AC
  Administered 2024-02-18: 11 [IU] via SUBCUTANEOUS

## 2024-02-18 MED ORDER — SACUBITRIL-VALSARTAN 24-26 MG PO TABS
1.0000 | ORAL_TABLET | Freq: Two times a day (BID) | ORAL | Status: DC
  Administered 2024-02-18 – 2024-02-19 (×3): 1 via ORAL
  Filled 2024-02-18 (×3): qty 1

## 2024-02-18 MED ORDER — INSULIN GLARGINE-YFGN 100 UNIT/ML ~~LOC~~ SOLN
5.0000 [IU] | Freq: Every day | SUBCUTANEOUS | Status: DC
  Administered 2024-02-18 – 2024-02-19 (×2): 5 [IU] via SUBCUTANEOUS
  Filled 2024-02-18 (×2): qty 0.05

## 2024-02-18 MED ORDER — BACLOFEN 5 MG HALF TABLET
ORAL_TABLET | Freq: Two times a day (BID) | ORAL | Status: AC
  Administered 2024-02-18: 5 mg via ORAL
  Filled 2024-02-18: qty 1

## 2024-02-18 MED ORDER — VITAMIN D 25 MCG (1000 UNIT) PO TABS
1000.0000 [IU] | ORAL_TABLET | Freq: Every day | ORAL | Status: DC
  Administered 2024-02-18 – 2024-02-29 (×12): 1000 [IU] via ORAL
  Filled 2024-02-18 (×12): qty 1

## 2024-02-18 MED ORDER — BACLOFEN 5 MG HALF TABLET
ORAL_TABLET | Freq: Two times a day (BID) | ORAL | Status: DC
  Administered 2024-02-18 – 2024-02-22 (×8): 5 mg via ORAL
  Filled 2024-02-18 (×10): qty 1

## 2024-02-18 MED ORDER — FERROUS SULFATE 325 (65 FE) MG PO TABS
325.0000 mg | ORAL_TABLET | Freq: Every day | ORAL | Status: DC
  Administered 2024-02-18 – 2024-02-28 (×11): 325 mg via ORAL
  Filled 2024-02-18 (×11): qty 1

## 2024-02-18 MED ORDER — BACLOFEN 5 MG PO TABS
2.5000 mg | ORAL_TABLET | Freq: Two times a day (BID) | ORAL | Status: DC
  Filled 2024-02-18 (×2): qty 1

## 2024-02-18 NOTE — Plan of Care (Signed)
   Problem: Education: Goal: Knowledge of risk factors and measures for prevention of condition will improve Outcome: Progressing   Problem: Coping: Goal: Psychosocial and spiritual needs will be supported Outcome: Progressing   Problem: Respiratory: Goal: Will maintain a patent airway Outcome: Progressing Goal: Complications related to the disease process, condition or treatment will be avoided or minimized Outcome: Progressing

## 2024-02-18 NOTE — Progress Notes (Addendum)
 PROGRESS NOTE    Earl [name]  ZOX:[id number] DOB: February 06, 1941 DOA: 02/14/2024 PCP: [name], MD    Chief Complaint  Patient presents with   Weakness   Fever    Brief Narrative:  Patient is a 83 year old gentleman with history of type 2 diabetes, hypertension, hyperlipidemia, CHF, A-fib on Eliquis, history of stroke with residual right-sided weakness, MGUS, GERD, BPH, hypothyroidism brought to the ED by EMS due to worsening generalized weakness.  Patient seen in the ED workup concerning for COVID-19 infection.  CT head MRI brain done negative for any acute abnormalities.  Patient admitted, placed on supportive care.   Assessment & Plan:   Principal Problem:   COVID-19 virus infection Active Problems:   Paroxysmal atrial fibrillation (HCC)   Essential hypertension   Hyperlipidemia   Non-insulin dependent type 2 diabetes mellitus (HCC)   BPH (benign prostatic hyperplasia)   Hypothyroidism   History of CVA (cerebrovascular accident)   Chronic anticoagulation   Diabetic 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 (HCC)   NICM (nonischemic cardiomyopathy) (HCC)   Chronic systolic CHF (congestive heart failure) (HCC)   Syncope, vasovagal  #1 COVID-19 infection -Patient presented with generalized weakness, lethargy, noted to have no significant respiratory symptoms. -Chest x-ray negative for pneumonia. -SARS coronavirus 2 PCR positive, influenza A and B PCR negative. -Respiratory syncytial virus by PCR negative. -Patient with sats of 97 to 100% on room air. -No indication for steroids or antivirals at this time. -Monitor fever curve which is slowly trending down. -If persistent fevers will check blood cultures, repeat chest x-ray and check a urinalysis and place empirically on antibiotics. -As needed Tylenol and as needed antitussives. -  2.  Hyperkalemia -Patient noted to have a potassium up to 6. -Status post Lokelma. -Potassium currently at  3.8 this morning. -Continue to hold Aldactone. -Follow.  3.  Rhabdomyolysis -Patient with elevated CK levels, felt likely secondary to be in the same position for several hours. -Renal function stable. -Diuretics on hold.   4.  Chronic systolic CHF/history of amyloid cardiomyopathy/hypertension/NICM -Last echo, 2021 with EF of 45 to 50%, LV global hypokinesis, moderate concentric LVH, grade 2 DD. -Patient noted to be on Toprol-XL 50 mg daily, Entresto 24/26 mg twice daily, Aldactone 25 mg daily, torsemide 20 mg twice daily prior to admission. -BP borderline. -Continue home regimen Toprol-XL.  BP soft and as such Entresto held.  -Resume Entresto.  -Continue to hold Aldactone and torsemide.   -Continue home regimen tafamidis for amyloid cardiomyopathy.  5.  Vasovagal syncope -Patient noted to have a vasovagal episode, 02/16/2024, while sitting on the bedside commode currently back to baseline. -MRI brain done on admission negative for any acute strokes. -2D echo done 08/2023 with EF of 45 to 50%, left ventricular global hypokinesis, grade 2 DD, no severe valvular abnormalities. -BP noted to be soft on 02/16/2024 and assessed patient hydrated gently with IV fluids.   -BP improved.  -No further syncopal episodes. -Supportive care.  6.  Paroxysmal A-fib -Continue Toprol-XL for rate control.   -Eliquis for anticoagulation.    7.  History of stroke with residual right-sided weakness/hyperlipidemia -Continue Eliquis, Lipitor. -PT/OT.  8.  Hypothyroidism -Continue Synthroid.   9.  BPH -Continue finasteride.  10.  Diabetes mellitus type 2 -Hemoglobin A1c 6.8 on 10/31/2023. -CBG 192 this morning, noted to go up as high as 413 around lunchtime.  -Start Semglee 5 units daily. -SSI. -Continue hold home regimen oral hypoglycemic agents. -SSI.  11.  Pancytopenia -Secondary to  acute COVID-19 infection. -Patient with no overt bleeding. -Count slowly improving.   DVT prophylaxis:  Eliquis Code Status: Full Family Communication: Updated patient.  No family at bedside.   Disposition: SNF  Status is: Observation The patient will require care spanning > 2 midnights and should be moved to inpatient because: Severity of illness   Consultants:  None  Procedures:  CT head 02/14/2024 Chest x-ray 02/14/2024 MRI brain 02/14/2024   Antimicrobials:  Anti-infectives (From admission, onward)    None         Subjective: Laying in bed.  Denies any chest pain or shortness of breath.  No abdominal pain.  Overall feeling better.  No further syncopal episodes.    Objective: Vitals:   02/17/24 1950 02/17/24 2315 02/18/24 1113 02/18/24 1300  BP: (!) 128/59 130/84 (!) 106/52   Pulse: 69 72 67   Resp:  (!) 22 20   Temp: 98.2 F (36.8 C) 98 F (36.7 C) (!) 100.4 F (38 C) 99.7 F (37.6 C)  TempSrc: Oral Oral Oral Oral  SpO2: 99% 99% 100%   Weight:        Intake/Output Summary (Last 24 hours) at 02/18/2024 1514 Last data filed at 02/18/2024 0800 Gross per 24 hour  Intake --  Output 1750 ml  Net -1750 ml   Filed Weights   02/14/24 1952  Weight: 72.1 kg    Examination:  General exam: NAD Respiratory system: CTAB anterior lung fields.  No wheezes, no crackles, no rhonchi.  Fair air movement.  Speaking in full sentences. Cardiovascular system: RRR no murmurs rubs or gallops.  No JVD.  No lower extremity edema. Gastrointestinal system: Abdomen is soft, nontender, nondistended, positive bowel sounds.  No rebound.  No guarding.  Central nervous system: Alert and oriented.  Right upper extremity weakness close to baseline.  Extremities: Symmetric 5 x 5 power. Skin: No rashes, lesions or ulcers Psychiatry: Judgement and insight appear normal. Mood & affect appropriate.     Data Reviewed: I have personally reviewed following labs and imaging studies  CBC: Recent Labs  Lab 02/14/24 2035 02/14/24 2342 02/16/24 0712 02/17/24 0540 02/18/24 0616  WBC 5.3  --   3.0* 2.9* 3.1*  HGB 11.9* 11.2* 12.9* 11.6* 12.5*  HCT 35.1* 33.0* 37.5* 33.9* 35.5*  MCV 88.4  --  86.6 86.7 85.1  PLT 149*  --  165 128* 133*    Basic Metabolic Panel: Recent Labs  Lab 02/14/24 2245 02/14/24 2342 02/15/24 1400 02/16/24 0711 02/16/24 0712 02/17/24 0540 02/18/24 0616 02/18/24 1142  NA 138 137 138  --  136 139 136  --   K 5.2* 6.0* 4.0  --  4.1 3.9 3.8  --   CL 109 108 108  --  103 109 104  --   CO2 20*  --  22  --  21* 22 22  --   GLUCOSE 277* 248* 200*  --  181* 118* 176* 395*  BUN 17 20 12   --  14 15 14   --   CREATININE 0.96 0.80 1.09  --  0.99 0.91 0.87  --   CALCIUM 8.5*  --  8.1*  --  8.5* 8.0* 7.9*  --   MG  --   --   --  1.8  --  1.7 1.9  --   PHOS  --   --   --   --   --  3.2  --   --     GFR: Estimated Creatinine Clearance:  66.8 mL/min (by C-G formula based on SCr of 0.87 mg/dL).  Liver Function Tests: Recent Labs  Lab 02/14/24 2245 02/17/24 0540  AST 54*  --   ALT 29  --   ALKPHOS 53  --   BILITOT 1.2  --   PROT 5.6*  --   ALBUMIN 3.1* 2.5*    CBG: Recent Labs  Lab 02/17/24 1154 02/17/24 1527 02/17/24 2143 02/18/24 0609 02/18/24 1112  GLUCAP 267* 210* 253* 192* 413*     Recent Results (from the past 240 hours)  Resp panel by RT-PCR (RSV, Flu A&B, Covid) Anterior Nasal Swab     Status: Abnormal   Collection Time: 02/14/24  8:34 PM   Specimen: Anterior Nasal Swab  Result Value Ref Range Status   SARS Coronavirus 2 by RT PCR POSITIVE (A) NEGATIVE Final   Influenza A by PCR NEGATIVE NEGATIVE Final   Influenza B by PCR NEGATIVE NEGATIVE Final    Comment: (NOTE) The Xpert Xpress SARS-CoV-2/FLU/RSV plus assay is intended as an aid in the diagnosis of influenza from Nasopharyngeal swab specimens and should not be used as a sole basis for treatment. Nasal washings and aspirates are unacceptable for Xpert Xpress SARS-CoV-2/FLU/RSV testing.  Fact Sheet for Patients: BloggerCourse.com  Fact Sheet for  Healthcare Providers: SeriousBroker.it  This test is not yet approved or cleared by the [place] FDA and has been authorized for detection and/or diagnosis of SARS-CoV-2 by FDA under an Emergency Use Authorization (EUA). This EUA will remain in effect (meaning this test can be used) for the duration of the COVID-19 declaration under Section 564(b)(1) of the Act, 21 U.S.C. section 360bbb-3(b)(1), unless the authorization is terminated or revoked.     Resp Syncytial Virus by PCR NEGATIVE NEGATIVE Final    Comment: (NOTE) Fact Sheet for Patients: BloggerCourse.com  Fact Sheet for Healthcare Providers: SeriousBroker.it  This test is not yet approved or cleared by the [place] FDA and has been authorized for detection and/or diagnosis of SARS-CoV-2 by FDA under an Emergency Use Authorization (EUA). This EUA will remain in effect (meaning this test can be used) for the duration of the COVID-19 declaration under Section 564(b)(1) of the Act, 21 U.S.C. section 360bbb-3(b)(1), unless the authorization is terminated or revoked.  Performed at [facility], [address] [place], [place] [address]          Radiology Studies: No results found.       Scheduled Meds:  apixaban  5 mg Oral BID   atorvastatin  40 mg Oral Daily   baclofen   Oral BID   benzonatate  100 mg Oral TID   cholecalciferol  1,000 Units Oral Daily   ferrous sulfate  325 mg Oral QHS   finasteride  5 mg Oral Daily   insulin aspart  0-5 Units Subcutaneous QHS   insulin aspart  0-9 Units Subcutaneous TID WC   insulin glargine-yfgn  5 Units Subcutaneous Daily   levothyroxine  25 mcg Oral Q0600   magnesium oxide  200 mg Oral Daily   metoprolol succinate  50 mg Oral QHS   sacubitril-valsartan  1 tablet Oral BID   Tafamidis  1 capsule Oral Daily   vitamin B-12  50 mcg Oral Daily   Continuous Infusions:     LOS: 2  days    Time spent: 35 minutes    Ramiro Harvest, MD [facility]   To contact the attending provider between 7A-7P or the covering provider during after hours  7P-7A, please log into the web site [email or web address] and access using universal [place] password for that web site. If you do not have the password, please call the hospital operator.  02/18/2024, 3:14 PM

## 2024-02-18 NOTE — Plan of Care (Signed)
 Explained that Tylenol was  being given for fever. New diabeties medication was started due to high blood sugar. Encouraged to participate more in care activities.   Problem: Coping: Goal: Ability to adjust to condition or change in health will improve Outcome: Progressing   Problem: Metabolic: Goal: Ability to maintain appropriate glucose levels will improve Outcome: Progressing   Problem: Skin Integrity: Goal: Risk for impaired skin integrity will decrease Outcome: Progressing

## 2024-02-19 ENCOUNTER — Inpatient Hospital Stay (HOSPITAL_COMMUNITY)

## 2024-02-19 DIAGNOSIS — E785 Hyperlipidemia, unspecified: Secondary | ICD-10-CM | POA: Diagnosis not present

## 2024-02-19 DIAGNOSIS — U071 COVID-19: Secondary | ICD-10-CM | POA: Diagnosis not present

## 2024-02-19 DIAGNOSIS — I5022 Chronic systolic (congestive) heart failure: Secondary | ICD-10-CM | POA: Diagnosis not present

## 2024-02-19 DIAGNOSIS — R55 Syncope and collapse: Secondary | ICD-10-CM | POA: Diagnosis not present

## 2024-02-19 LAB — BASIC METABOLIC PANEL WITH GFR
Anion gap: 10 (ref 5–15)
BUN: 19 mg/dL (ref 8–23)
CO2: 21 mmol/L — ABNORMAL LOW (ref 22–32)
Calcium: 8.1 mg/dL — ABNORMAL LOW (ref 8.9–10.3)
Chloride: 106 mmol/L (ref 98–111)
Creatinine, Ser: 1.1 mg/dL (ref 0.61–1.24)
GFR, Estimated: >60 mL/min (ref >60)
Glucose, Bld: 169 mg/dL — ABNORMAL HIGH (ref 70–99)
Potassium: 4.1 mmol/L (ref 3.5–5.1)
Sodium: 137 mmol/L (ref 135–145)

## 2024-02-19 LAB — GLUCOSE, CAPILLARY
Glucose-Capillary: 175 mg/dL — ABNORMAL HIGH (ref 70–99)
Glucose-Capillary: 366 mg/dL — ABNORMAL HIGH (ref 70–99)
Glucose-Capillary: 256 mg/dL — ABNORMAL HIGH (ref 70–99)
Glucose-Capillary: 172 mg/dL — ABNORMAL HIGH (ref 70–99)
Glucose-Capillary: 117 mg/dL — ABNORMAL HIGH (ref 70–99)

## 2024-02-19 LAB — CBC
HCT: 37.9 % — ABNORMAL LOW (ref 39.0–52.0)
Hemoglobin: 12.9 g/dL — ABNORMAL LOW (ref 13.0–17.0)
MCH: 29.1 pg (ref 26.0–34.0)
MCHC: 34 g/dL (ref 30.0–36.0)
MCV: 85.4 fL (ref 80.0–100.0)
Platelets: 134 10*3/uL — ABNORMAL LOW (ref 150–400)
RBC: 4.44 MIL/uL (ref 4.22–5.81)
RDW: 13.6 % (ref 11.5–15.5)
WBC: 3.5 10*3/uL — ABNORMAL LOW (ref 4.0–10.5)
nRBC: 0 % (ref 0.0–0.2)

## 2024-02-19 LAB — URINALYSIS, COMPLETE (UACMP) WITH MICROSCOPIC
Bilirubin Urine: NEGATIVE
Glucose, UA: 50 mg/dL — AB
Ketones, ur: NEGATIVE mg/dL
Leukocytes,Ua: NEGATIVE
Nitrite: NEGATIVE
Protein, ur: 100 mg/dL — AB
Specific Gravity, Urine: 1.013 (ref 1.005–1.030)
pH: 5 (ref 5.0–8.0)

## 2024-02-19 MED ORDER — INSULIN GLARGINE-YFGN 100 UNIT/ML ~~LOC~~ SOLN
3.0000 [IU] | Freq: Once | SUBCUTANEOUS | Status: AC
  Administered 2024-02-19: 3 [IU] via SUBCUTANEOUS
  Filled 2024-02-19: qty 0.03

## 2024-02-19 MED ORDER — INSULIN ASPART 100 UNIT/ML IJ SOLN
4.0000 [IU] | Freq: Three times a day (TID) | INTRAMUSCULAR | Status: DC
  Administered 2024-02-19 – 2024-02-20 (×5): 4 [IU] via SUBCUTANEOUS

## 2024-02-19 MED ORDER — INSULIN GLARGINE-YFGN 100 UNIT/ML ~~LOC~~ SOLN
8.0000 [IU] | Freq: Every day | SUBCUTANEOUS | Status: DC
  Administered 2024-02-20: 8 [IU] via SUBCUTANEOUS
  Filled 2024-02-19 (×2): qty 0.08

## 2024-02-19 MED ORDER — ALBUMIN HUMAN 25 % IV SOLN
25.0000 g | Freq: Once | INTRAVENOUS | Status: AC
  Administered 2024-02-19: 12.5 g via INTRAVENOUS
  Filled 2024-02-19: qty 100

## 2024-02-19 MED ORDER — SODIUM CHLORIDE 0.9 % IV BOLUS
500.0000 mL | Freq: Once | INTRAVENOUS | Status: AC
  Administered 2024-02-19: 500 mL via INTRAVENOUS

## 2024-02-19 MED ORDER — SODIUM CHLORIDE 0.9 % IV SOLN: INTRAVENOUS | Status: AC

## 2024-02-19 NOTE — Progress Notes (Signed)
 Physical Therapy Treatment Patient Details Name: Earl [name] MRN: [id number] DOB: [birth year] Today's Date: 02/19/2024   History of Present Illness Pt is 83 yo presenting to [facility] on 3/26 due to generalized weakness, L lean all day and fall. PMH includes PAF, cardiac amyloidosis, CHF, CVA, DMII, MGUS, HTN, FTT.    PT Comments  Pt seems to have slightly regressed since initial evaluation with heavier posterior lean most likely to he has not been out of bed since evaluation. Pt was Min A for bed mobility, Max A for sit to stand 3x from EOB with RW and heavy multi modal cueing. Attempted ambulated getting 3 feet with increasing posterior lean requiring pt to sit to prevent fall. Daughter present and supportive. Due to pt current functional status, home set up and available assistance at home recommending skilled physical therapy services < 3 hours/day in order to address strength, balance and functional mobility to decrease risk for falls, injury, immobility, skin break down and re-hospitalization.      If plan is discharge home, recommend the following: A lot of help with walking and/or transfers;Assistance with cooking/housework;Assist for transportation;Help with stairs or ramp for entrance   Can travel by private vehicle     No  Equipment Recommendations  Hospital bed       Precautions / Restrictions Precautions Precautions: Fall;Other (comment) Recall of Precautions/Restrictions: Intact Precaution/Restrictions Comments: COVID Restrictions Weight Bearing Restrictions Per Provider Order: No     Mobility  Bed Mobility Overal bed mobility: Needs Assistance Bed Mobility: Supine to Sit     Supine to sit: Min assist, HOB elevated, Used rails     General bed mobility comments: Increased required, verbal cues provided for sequencing. Pt required assistance facilitating trunk to upright position and scooting forward until B feet were flat on the floor    Transfers Overall transfer  level: Needs assistance Equipment used: Rolling walker (2 wheels), 1 person hand held assist Transfers: Sit to/from Stand, Bed to chair/wheelchair/BSC Sit to Stand: Max assist   Step pivot transfers: Max assist       General transfer comment: Verbal cues provided for B hand/foot placement and sequencing for 3 x during session with RW 2x from EOB and HHA 1x from EOB for transfer. Small steps with face to face HHA with constant cues for stability with stepping to recliner with heavy posterior lean for all sit to stand.    Ambulation/Gait Ambulation/Gait assistance: Mod assist Gait Distance (Feet): 3 Feet Assistive device: Rolling walker (2 wheels) Gait Pattern/deviations: Decreased step length - right, Shuffle, Narrow base of support, Step-to pattern Gait velocity: significantly decreased Gait velocity interpretation: <1.31 ft/sec, indicative of household ambulator   General Gait Details: Very short shuffling partial step through gait pattern with narrow base of support with increasing posterior lean with standing and very short distance non-functional gait.    Balance Overall balance assessment: Needs assistance Sitting-balance support: Bilateral upper extremity supported, Feet supported Sitting balance-Leahy Scale: Poor Sitting balance - Comments: static sitting EOB requires intermittent MIn A for balance to prevent posterior fall with heavy multi modal cueing. Postural control: Right lateral lean, Posterior lean Standing balance support: Bilateral upper extremity supported, Reliant on assistive device for balance, During functional activity Standing balance-Leahy Scale: Poor Standing balance comment: Pt reliant on AD for balance. R lateral and posterior lean when standing supported.        Communication Communication Communication: No apparent difficulties  Cognition Arousal: Alert Behavior During Therapy: WFL for tasks assessed/performed   PT -  Cognitive impairments:  Safety/Judgement, Sequencing, Initiation, Attention       Following commands: Impaired Following commands impaired: Follows one step commands with increased time    Cueing Cueing Techniques: Verbal cues     General Comments General comments (skin integrity, edema, etc.): Vital signs stable. Daughter present throughout session      Pertinent Vitals/Pain Pain Assessment Pain Assessment: No/denies pain     PT Goals (current goals can now be found in the care plan section) Acute Rehab PT Goals Patient Stated Goal: To improve strength and mobility to reduce risk for falls. PT Goal Formulation: With patient/family Time For Goal Achievement: 03/01/24 Potential to Achieve Goals: Fair Progress towards PT goals: Progressing toward goals    Frequency    Min 2X/week      PT Plan  Continue with current POC        AM-PAC PT "6 Clicks" Mobility   Outcome Measure  Help needed turning from your back to your side while in a flat bed without using bedrails?: A Little Help needed moving from lying on your back to sitting on the side of a flat bed without using bedrails?: A Little Help needed moving to and from a bed to a chair (including a wheelchair)?: A Lot Help needed standing up from a chair using your arms (e.g., wheelchair or bedside chair)?: A Lot Help needed to walk in hospital room?: Total Help needed climbing 3-5 steps with a railing? : Total 6 Click Score: 12    End of Session Equipment Utilized During Treatment: Gait belt Activity Tolerance: Patient tolerated treatment well;Patient limited by fatigue Patient left: with call bell/phone within reach;with family/visitor present;in chair Nurse Communication: Mobility status PT Visit Diagnosis: Other abnormalities of gait and mobility (R26.89);Muscle weakness (generalized) (M62.81);Unsteadiness on feet (R26.81)     Time: 1318-1400 PT Time Calculation (min) (ACUTE ONLY): 42 min  Charges:    $Gait Training: 8-22  mins $Therapeutic Activity: 23-37 mins PT General Charges $$ ACUTE PT VISIT: 1 Visit                     Harrel Carina, DPT, CLT  Acute Rehabilitation Services Office: [phone] (Secure chat preferred)    Earl [name] 02/19/2024, 2:20 PM

## 2024-02-19 NOTE — Progress Notes (Signed)
 Pt was on bedside commode and went unresponsive because had been straining to defecate and vagaled. He was cold and clammy and his BP was 82/71 with a HR of 66.  He also vomited a lg amount of undigested food.  He then had a huge BM and was put back to bed and his BP went up to 102/48, with a hr of 62.  He is now resting in bed calmly and said he feels much better since he had his BM.  Paged Dr Janee Morn to notify him. No signs of distress at this time.  Also was not hypoglycemic during this event, as his blood sugar was in the 200s during this event.

## 2024-02-19 NOTE — Inpatient Diabetes Management (Signed)
 Inpatient Diabetes Program Recommendations  AACE/ADA: New Consensus Statement on Inpatient Glycemic Control (2015)  Target Ranges:  Prepandial:   less than 140 mg/dL      Peak postprandial:   less than 180 mg/dL (1-2 hours)      Critically ill patients:  140 - 180 mg/dL    Latest Reference Range & Units 10/31/23 16:01  Hemoglobin A1C <5.7 % of total Hgb 6.8 (H)  (H): Data is abnormally high  Latest Reference Range & Units 02/18/24 06:09 02/18/24 11:12 02/18/24 16:27 02/18/24 21:25  Glucose-Capillary 70 - 99 mg/dL 811 (H)  2 units Novolog  413 (H)  11 units Novolog  5 units Semglee @1254   242 (H)  3 units Novolog  217 (H)  2 units Novolog   (H): Data is abnormally high  Latest Reference Range & Units 02/19/24 06:12  Glucose-Capillary 70 - 99 mg/dL 914 (H)  2 units Novolog   (H): Data is abnormally high     Home DM Meds: Metformin 1000 mg BID  Current Orders: Semglee 5 units daily     Novolog Sensitive Correction Scale/ SSI (0-9 units) TID AC + HS    MD- If pt continues to have elevated afternoon CBGs, please consider adding Novolog Meal Coverage:  Novolog 4 units TID with meals HOLD if pt NPO HOLD if pt eats <50% meals    --Will follow patient during hospitalization--  Ambrose Finland RN, MSN, CDCES Diabetes Coordinator Inpatient Glycemic Control Team Team Pager: [phone] (8a-5p)

## 2024-02-19 NOTE — Plan of Care (Signed)
 Had a lg bm and when skin care was provided, had masd and skin came off of bottom when providing skin care and wiping skin.  Had a busy day with blood pressure and and other events but is in good spirits.  Daughter at his side.  Problem: Education: Goal: Knowledge of risk factors and measures for prevention of condition will improve Outcome: Progressing   Problem: Coping: Goal: Psychosocial and spiritual needs will be supported Outcome: Progressing   Problem: Respiratory: Goal: Will maintain a patent airway Outcome: Progressing   Problem: Respiratory: Goal: Complications related to the disease process, condition or treatment will be avoided or minimized Outcome: Progressing   Problem: Education: Goal: Ability to describe self-care measures that may prevent or decrease complications (Diabetes Survival Skills Education) will improve Outcome: Progressing   Problem: Skin Integrity: Goal: Risk for impaired skin integrity will decrease Outcome: Progressing

## 2024-02-19 NOTE — Progress Notes (Addendum)
 PROGRESS NOTE    Earl [name]  YQM:[id number] DOB: Jun 08, 1941 DOA: 02/14/2024 PCP: [name], MD    Chief Complaint  Patient presents with   Weakness   Fever    Brief Narrative:  Patient is a 83 year old gentleman with history of type 2 diabetes, hypertension, hyperlipidemia, CHF, A-fib on Eliquis, history of stroke with residual right-sided weakness, MGUS, GERD, BPH, hypothyroidism brought to the ED by EMS due to worsening generalized weakness.  Patient seen in the ED workup concerning for COVID-19 infection.  CT head MRI brain done negative for any acute abnormalities.  Patient admitted, placed on supportive care.   Assessment & Plan:   Principal Problem:   COVID-19 virus infection Active Problems:   Paroxysmal atrial fibrillation (HCC)   Essential hypertension   Hyperlipidemia   Non-insulin dependent type 2 diabetes mellitus (HCC)   BPH (benign prostatic hyperplasia)   Hypothyroidism   History of CVA (cerebrovascular accident)   Chronic anticoagulation   Diabetic 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 (HCC)   NICM (nonischemic cardiomyopathy) (HCC)   Chronic systolic CHF (congestive heart failure) (HCC)   Syncope, vasovagal  #1 COVID-19 infection -Patient presented with generalized weakness, lethargy, noted to have no significant respiratory symptoms. -Chest x-ray negative for pneumonia. -SARS coronavirus 2 PCR positive, influenza A and B PCR negative. -Respiratory syncytial virus by PCR negative. -Patient with sats of 99 to 100% on room air. -No indication for steroids or antivirals at this time. -Monitor fever curve which is slowly trending down. -Patient noted to have a temp of 102.3 around 4 AM this morning. -Will check blood cultures, repeat chest x-ray, check a UA with cultures and sensitivities. -As needed Tylenol and as needed antitussives. -  2.  Hyperkalemia -Patient noted to have a potassium up to 6. -Status post  Lokelma. -Potassium currently at 4.1 this morning. -Continue to hold Aldactone. -Follow.  3.  Rhabdomyolysis -Patient with elevated CK levels, felt likely secondary to be in the same position for several hours. -Renal function stable. -Diuretics on hold.   4.  Chronic systolic CHF/history of amyloid cardiomyopathy/hypertension/NICM -Last echo, 2021 with EF of 45 to 50%, LV global hypokinesis, moderate concentric LVH, grade 2 DD. -Patient noted to be on Toprol-XL 50 mg daily, Entresto 24/26 mg twice daily, Aldactone 25 mg daily, torsemide 20 mg twice daily prior to admission. -BP borderline. -Continue home regimen Toprol-XL.  BP soft and as such Entresto held initially and resumed. -Patient noted to have another vasovagal episode with systolic blood pressure going down into the 80s and as such we will discontinue Entresto. -Continue to hold Aldactone and torsemide. -Continue home regimen tafamidis for amyloid cardiomyopathy. -Will likely not resume Entresto, Aldactone, torsemide on discharge until patient follows up with primary cardiologist in the outpatient setting.  5.  Vasovagal syncope -Patient noted to have a vasovagal episode, 02/16/2024, while sitting on the bedside commode currently back to baseline. -MRI brain done on admission negative for any acute strokes. -2D echo done 08/2023 with EF of 45 to 50%, left ventricular global hypokinesis, grade 2 DD, no severe valvular abnormalities. -BP noted to be soft on 02/16/2024 and assessed patient hydrated gently with IV fluids.   -BP improved.  -Patient noted to have another vasovagal episode of syncope while on the bedside commode having a large bowel movement per RN today, 02/19/2024.  Per RN during episode blood pressure down to 82/71 and after patient placed in bed BP improved to 102/48. -Discontinue Entresto. -Normal saline 500 cc  bolus x 1. -IV albumin x 1. -Gentle hydration, normal saline 100 cc an hour for the next 24  hours. -Supportive care.  6.  Paroxysmal A-fib -Continue Toprol-XL for rate control.   -Eliquis for anticoagulation.    7.  History of stroke with residual right-sided weakness/hyperlipidemia -Continue Eliquis, Lipitor. -PT/OT.  8.  Hypothyroidism -Synthroid.    9.  BPH -Finasteride.   10.  Diabetes mellitus type 2 -Hemoglobin A1c 6.8 on 10/31/2023. -CBG 175 this morning, noted to go up as high as 366 around lunchtime.  -Increase Semglee to 8 units daily.   -Start meal coverage NovoLog 4 units 3 times daily with meals.  -SSI.    11.  Pancytopenia -Secondary to acute COVID-19 infection. -Patient with no overt bleeding. -Counts slowly improving.   DVT prophylaxis: Eliquis Code Status: Full Family Communication: Updated patient.  Updated daughter at bedside.  Disposition: SNF  Status is: Observation The patient will require care spanning > 2 midnights and should be moved to inpatient because: Severity of illness   Consultants:  None  Procedures:  CT head 02/14/2024 Chest x-ray 02/14/2024 MRI brain 02/14/2024   Antimicrobials:  Anti-infectives (From admission, onward)    None         Subjective: Patient lying in bed eating lunch.  Daughter at bedside.  Patient denies any chest pain or significant shortness of breath.  No abdominal pain.  Alert and oriented to self place and time.  Daughter feels patient is a little more weaker today.  Patient leaning to the right.   Objective: Vitals:   02/19/24 0422 02/19/24 0600 02/19/24 0841 02/19/24 1128  BP: (!) 112/58  98/69 (!) 103/57  Pulse: 74  60 70  Resp: 20  17 18   Temp: (!) 102.3 F (39.1 C) 99 F (37.2 C) 98.5 F (36.9 C) 98.7 F (37.1 C)  TempSrc: Axillary Oral Oral Oral  SpO2: 99%  100%   Weight:        Intake/Output Summary (Last 24 hours) at 02/19/2024 1602 Last data filed at 02/19/2024 0600 Gross per 24 hour  Intake 150 ml  Output 600 ml  Net -450 ml   Filed Weights   02/14/24 1952  Weight:  72.1 kg    Examination:  General exam: NAD Respiratory system: CTAB.  No wheezes, no crackles, no rhonchi.  Fair air movement.  Speaking in full sentences. Cardiovascular system: Regular rate rhythm no murmurs rubs or gallops.  No JVD.  No lower extremity edema.  Gastrointestinal system: Abdomen is soft, nontender, nondistended, positive bowel sounds.  No rebound.  No guarding.  Central nervous system: Alert and oriented.  Right upper extremity weakness close to baseline.  Extremities: Symmetric 5 x 5 power. Skin: No rashes, lesions or ulcers Psychiatry: Judgement and insight appear normal. Mood & affect appropriate.     Data Reviewed: I have personally reviewed following labs and imaging studies  CBC: Recent Labs  Lab 02/14/24 2035 02/14/24 2342 02/16/24 0712 02/17/24 0540 02/18/24 0616 02/19/24 0709  WBC 5.3  --  3.0* 2.9* 3.1* 3.5*  HGB 11.9* 11.2* 12.9* 11.6* 12.5* 12.9*  HCT 35.1* 33.0* 37.5* 33.9* 35.5* 37.9*  MCV 88.4  --  86.6 86.7 85.1 85.4  PLT 149*  --  165 128* 133* 134*    Basic Metabolic Panel: Recent Labs  Lab 02/15/24 1400 02/16/24 0711 02/16/24 0712 02/17/24 0540 02/18/24 0616 02/18/24 1142 02/19/24 0709  NA 138  --  136 139 136  --  137  K  4.0  --  4.1 3.9 3.8  --  4.1  CL 108  --  103 109 104  --  106  CO2 22  --  21* 22 22  --  21*  GLUCOSE 200*  --  181* 118* 176* 395* 169*  BUN 12  --  14 15 14   --  19  CREATININE 1.09  --  0.99 0.91 0.87  --  1.10  CALCIUM 8.1*  --  8.5* 8.0* 7.9*  --  8.1*  MG  --  1.8  --  1.7 1.9  --   --   PHOS  --   --   --  3.2  --   --   --     GFR: Estimated Creatinine Clearance: 52.8 mL/min (by C-G formula based on SCr of 1.1 mg/dL).  Liver Function Tests: Recent Labs  Lab 02/14/24 2245 02/17/24 0540  AST 54*  --   ALT 29  --   ALKPHOS 53  --   BILITOT 1.2  --   PROT 5.6*  --   ALBUMIN 3.1* 2.5*    CBG: Recent Labs  Lab 02/18/24 1627 02/18/24 2125 02/19/24 0612 02/19/24 1249 02/19/24 1453   GLUCAP 242* 217* 175* 366* 256*     Recent Results (from the past 240 hours)  Resp panel by RT-PCR (RSV, Flu A&B, Covid) Anterior Nasal Swab     Status: Abnormal   Collection Time: 02/14/24  8:34 PM   Specimen: Anterior Nasal Swab  Result Value Ref Range Status   SARS Coronavirus 2 by RT PCR POSITIVE (A) NEGATIVE Final   Influenza A by PCR NEGATIVE NEGATIVE Final   Influenza B by PCR NEGATIVE NEGATIVE Final    Comment: (NOTE) The Xpert Xpress SARS-CoV-2/FLU/RSV plus assay is intended as an aid in the diagnosis of influenza from Nasopharyngeal swab specimens and should not be used as a sole basis for treatment. Nasal washings and aspirates are unacceptable for Xpert Xpress SARS-CoV-2/FLU/RSV testing.  Fact Sheet for Patients: BloggerCourse.com  Fact Sheet for Healthcare Providers: SeriousBroker.it  This test is not yet approved or cleared by the [place] FDA and has been authorized for detection and/or diagnosis of SARS-CoV-2 by FDA under an Emergency Use Authorization (EUA). This EUA will remain in effect (meaning this test can be used) for the duration of the COVID-19 declaration under Section 564(b)(1) of the Act, 21 U.S.C. section 360bbb-3(b)(1), unless the authorization is terminated or revoked.     Resp Syncytial Virus by PCR NEGATIVE NEGATIVE Final    Comment: (NOTE) Fact Sheet for Patients: BloggerCourse.com  Fact Sheet for Healthcare Providers: SeriousBroker.it  This test is not yet approved or cleared by the [place] FDA and has been authorized for detection and/or diagnosis of SARS-CoV-2 by FDA under an Emergency Use Authorization (EUA). This EUA will remain in effect (meaning this test can be used) for the duration of the COVID-19 declaration under Section 564(b)(1) of the Act, 21 U.S.C. section 360bbb-3(b)(1), unless the authorization is terminated  or revoked.  Performed at [facility], [address], [place], [place] [address]          Radiology Studies: No results found.       Scheduled Meds:  apixaban  5 mg Oral BID   atorvastatin  40 mg Oral Daily   baclofen   Oral BID   benzonatate  100 mg Oral TID   cholecalciferol  1,000 Units Oral Daily   ferrous sulfate  325  mg Oral QHS   finasteride  5 mg Oral Daily   insulin aspart  0-5 Units Subcutaneous QHS   insulin aspart  0-9 Units Subcutaneous TID WC   insulin aspart  4 Units Subcutaneous TID WC   insulin glargine-yfgn  5 Units Subcutaneous Daily   levothyroxine  25 mcg Oral Q0600   magnesium oxide  200 mg Oral Daily   metoprolol succinate  50 mg Oral QHS   sacubitril-valsartan  1 tablet Oral BID   Tafamidis  1 capsule Oral Daily   vitamin B-12  50 mcg Oral Daily   Continuous Infusions:     LOS: 3 days    Time spent: 35 minutes    Ramiro Harvest, MD [facility]   To contact the attending provider between 7A-7P or the covering provider during after hours 7P-7A, please log into the web site [email or web address] and access using universal  password for that web site. If you do not have the password, please call the hospital operator.  02/19/2024, 4:02 PM

## 2024-02-19 NOTE — TOC Progression Note (Signed)
 Transition of Care [facility]) - Progression Note    Patient Details  Name: Earl [name] MRN: [id number] Date of Birth: 05-15-1941  [facility] CM/SW Contact  Baldemar Lenis, [place] Phone Number: 02/19/2024, 2:49 PM  Clinical Narrative:   CSW spoke with daughter, [name], to discuss SNF options. Family reviewed options over the weekend, and determined preferences: [facility], [facility], [facility], and [facility]. CSW contacted [facility], they do not have a private room for patient, would have to admit after isolation ends. [facility] referral is still pending, CSW asked them to review. [facility] would have a private room available for patient. [facility] does not have a private room, they would have to wait until after isolation ends. Still awaiting decision from [facility]. CSW to follow.    Expected Discharge Plan: Skilled Nursing Facility Barriers to Discharge: Continued Medical Work up, English as a second language teacher  Expected Discharge Plan and Services     Post Acute Care Choice: Skilled Nursing Facility Living arrangements for the past 2 months: Single Family Home                                       Social Determinants of Health (SDOH) Interventions SDOH Screenings   Food Insecurity: No Food Insecurity (02/15/2024)  Housing: Low Risk  (02/15/2024)  Transportation Needs: No Transportation Needs (02/15/2024)  Utilities: Not At Risk (02/15/2024)  Depression (PHQ2-9): Low Risk  (10/03/2023)  Financial Resource Strain: Patient Declined (01/16/2024)  Physical Activity: Unknown (01/16/2024)  Social Connections: Moderately Integrated (01/16/2024)  Stress: Stress Concern Present (01/16/2024)  Tobacco Use: Low Risk  (02/14/2024)    Readmission Risk Interventions     No data to display

## 2024-02-20 DIAGNOSIS — U071 COVID-19: Secondary | ICD-10-CM | POA: Diagnosis not present

## 2024-02-20 DIAGNOSIS — E785 Hyperlipidemia, unspecified: Secondary | ICD-10-CM | POA: Diagnosis not present

## 2024-02-20 DIAGNOSIS — I5022 Chronic systolic (congestive) heart failure: Secondary | ICD-10-CM | POA: Diagnosis not present

## 2024-02-20 DIAGNOSIS — R55 Syncope and collapse: Secondary | ICD-10-CM | POA: Diagnosis not present

## 2024-02-20 LAB — GLUCOSE, CAPILLARY
Glucose-Capillary: 119 mg/dL — ABNORMAL HIGH (ref 70–99)
Glucose-Capillary: 286 mg/dL — ABNORMAL HIGH (ref 70–99)
Glucose-Capillary: 158 mg/dL — ABNORMAL HIGH (ref 70–99)
Glucose-Capillary: 76 mg/dL (ref 70–99)

## 2024-02-20 LAB — URINE CULTURE: Culture: NO GROWTH

## 2024-02-20 LAB — CBC
HCT: 34 % — ABNORMAL LOW (ref 39.0–52.0)
Hemoglobin: 11.8 g/dL — ABNORMAL LOW (ref 13.0–17.0)
MCH: 29.6 pg (ref 26.0–34.0)
MCHC: 34.7 g/dL (ref 30.0–36.0)
MCV: 85.4 fL (ref 80.0–100.0)
Platelets: 130 10*3/uL — ABNORMAL LOW (ref 150–400)
RBC: 3.98 MIL/uL — ABNORMAL LOW (ref 4.22–5.81)
RDW: 13.7 % (ref 11.5–15.5)
WBC: 3.2 10*3/uL — ABNORMAL LOW (ref 4.0–10.5)
nRBC: 0 % (ref 0.0–0.2)

## 2024-02-20 LAB — RENAL FUNCTION PANEL
Albumin: 2.5 g/dL — ABNORMAL LOW (ref 3.5–5.0)
Anion gap: 10 (ref 5–15)
BUN: 21 mg/dL (ref 8–23)
CO2: 22 mmol/L (ref 22–32)
Calcium: 8 mg/dL — ABNORMAL LOW (ref 8.9–10.3)
Chloride: 107 mmol/L (ref 98–111)
Creatinine, Ser: 0.93 mg/dL (ref 0.61–1.24)
GFR, Estimated: >60 mL/min (ref >60)
Glucose, Bld: 121 mg/dL — ABNORMAL HIGH (ref 70–99)
Phosphorus: 2.8 mg/dL (ref 2.5–4.6)
Potassium: 4.2 mmol/L (ref 3.5–5.1)
Sodium: 139 mmol/L (ref 135–145)

## 2024-02-20 LAB — MAGNESIUM: Magnesium: 1.9 mg/dL (ref 1.7–2.4)

## 2024-02-20 NOTE — Plan of Care (Signed)
 In room eating and assisted with meal. All food eaten. Turn Q2hrs to keep off backside. Sacral area pink and barrier cream in use with foam dressing for protection. IV fluids infusing and BP remains greater than 100 systolic .   Problem: Education: Goal: Knowledge of risk factors and measures for prevention of condition will improve Outcome: Progressing   Problem: Coping: Goal: Psychosocial and spiritual needs will be supported Outcome: Progressing   Problem: Nutritional: Goal: Maintenance of adequate nutrition will improve Outcome: Progressing   Problem: Skin Integrity: Goal: Risk for impaired skin integrity will decrease Outcome: Progressing   Problem: Coping: Goal: Level of anxiety will decrease Outcome: Progressing

## 2024-02-20 NOTE — TOC Progression Note (Signed)
 Transition of Care [facility]) - Progression Note    Patient Details  Name: Earl [name] MRN: [id number] Date of Birth: 03/12/1941  [facility]) CM/SW Contact  Baldemar Lenis, [place] Phone Number: 02/20/2024, 4:11 PM  Clinical Narrative:   CSW received update from [facility] that they would not be able to determine if they could offer a bed or not until patient is closer to being off isolation. CSW spoke with daughter, [name], to provide updated information to her about bed availability at her preferences. She will review with her brother and update CSW. CSW to follow.    Expected Discharge Plan: Skilled Nursing Facility Barriers to Discharge: Continued Medical Work up, English as a second language teacher  Expected Discharge Plan and Services     Post Acute Care Choice: Skilled Nursing Facility Living arrangements for the past 2 months: Single Family Home                                       Social Determinants of Health (SDOH) Interventions SDOH Screenings   Food Insecurity: No Food Insecurity (02/15/2024)  Housing: Low Risk  (02/15/2024)  Transportation Needs: No Transportation Needs (02/15/2024)  Utilities: Not At Risk (02/15/2024)  Depression (PHQ2-9): Low Risk  (10/03/2023)  Financial Resource Strain: Patient Declined (01/16/2024)  Physical Activity: Unknown (01/16/2024)  Social Connections: Moderately Integrated (01/16/2024)  Stress: Stress Concern Present (01/16/2024)  Tobacco Use: Low Risk  (02/14/2024)    Readmission Risk Interventions     No data to display

## 2024-02-20 NOTE — Progress Notes (Signed)
 PROGRESS NOTE    Earl [name]  ZOX:[id number] DOB: 26-Sep-1941 DOA: 02/14/2024 PCP: [name], MD    Chief Complaint  Patient presents with   Weakness   Fever    Brief Narrative:  Patient is a 83 year old gentleman with history of type 2 diabetes, hypertension, hyperlipidemia, CHF, A-fib on Eliquis, history of stroke with residual right-sided weakness, MGUS, GERD, BPH, hypothyroidism brought to the ED by EMS due to worsening generalized weakness.  Patient seen in the ED workup concerning for COVID-19 infection.  CT head MRI brain done negative for any acute abnormalities.  Patient admitted, placed on supportive care.   Assessment & Plan:   Principal Problem:   COVID-19 virus infection Active Problems:   Paroxysmal atrial fibrillation (HCC)   Essential hypertension   Hyperlipidemia   Non-insulin dependent type 2 diabetes mellitus (HCC)   BPH (benign prostatic hyperplasia)   Hypothyroidism   History of CVA (cerebrovascular accident)   Chronic anticoagulation   Diabetic 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 (HCC)   NICM (nonischemic cardiomyopathy) (HCC)   Chronic systolic CHF (congestive heart failure) (HCC)   Syncope, vasovagal  #1 COVID-19 infection -Patient presented with generalized weakness, lethargy, noted to have no significant respiratory symptoms. -Chest x-ray negative for pneumonia. -SARS coronavirus 2 PCR positive, influenza A and B PCR negative. -Respiratory syncytial virus by PCR negative. -Patient with sats of 99 to 100% on room air. -No indication for steroids or antivirals at this time. -Monitor fever curve which is slowly trending down. -Patient noted to have a temp of 102.3 around 4 AM (02/19/2024 ). -Blood cultures ordered with no growth to date. -Chest x-ray with no acute abnormalities. -Repeat urinalysis nitrite negative leukocytes negative. -Continue Tylenol as needed as well as antitussives as needed.  2.   Hyperkalemia -Patient noted to have a potassium up to 6. -Status post Lokelma. -Potassium currently at 4.2 this morning. -Aldactone and Entresto on hold.   3.  Rhabdomyolysis -Patient with elevated CK levels, felt likely secondary to be in the same position for several hours. -Renal function stable. -Diuretics on hold.   4.  Chronic systolic CHF/history of amyloid cardiomyopathy/hypertension/NICM -Last echo, 2021 with EF of 45 to 50%, LV global hypokinesis, moderate concentric LVH, grade 2 DD. -Patient noted to be on Toprol-XL 50 mg daily, Entresto 24/26 mg twice daily, Aldactone 25 mg daily, torsemide 20 mg twice daily prior to admission. -BP borderline. -Continue home regimen Toprol-XL.  BP soft and as such Entresto held initially and resumed and subsequently discontinued after patient noted to have another vasovagal episode with systolic blood pressures dropping in the 80s.. -Continue to hold Aldactone and torsemide. -Continue home regimen tafamidis for amyloid cardiomyopathy. -Will likely not resume Entresto, Aldactone, torsemide on discharge until patient follows up with primary cardiologist in the outpatient setting.  5.  Vasovagal syncope -Patient noted to have a vasovagal episode, 02/16/2024, while sitting on the bedside commode currently back to baseline. -MRI brain done on admission negative for any acute strokes. -2D echo done 08/2023 with EF of 45 to 50%, left ventricular global hypokinesis, grade 2 DD, no severe valvular abnormalities. -BP noted to be soft on 02/16/2024 and assessed patient hydrated gently with IV fluids.   -BP improved.  -Patient noted to have another vasovagal episode of syncope while on the bedside commode having a large bowel movement per RN on 02/19/2024.  Per RN during episode blood pressure down to 82/71 and after patient placed in bed BP improved to 102/48. -Discontinued  Entresto. -Patient given a bolus of IV fluids 500 cc on 02/19/2024 as well as IV  albumin x 1. -BP improved. -Continue gentle hydration and discontinue this afternoon. -Supportive care.  6.  Paroxysmal A-fib -Continue Toprol-XL for rate control.   -Eliquis for anticoagulation.    7.  History of stroke with residual right-sided weakness/hyperlipidemia -Continue Lipitor and Eliquis for secondary stroke prophylaxis.   -PT/OT.   8.  Hypothyroidism -Synthroid.    9.  BPH -Continue finasteride.   10.  Diabetes mellitus type 2 -Hemoglobin A1c 6.8 on 10/31/2023. -CBG 119 this morning. -Continue Semglee 8 units daily.   -Meal coverage NovoLog 4 units 3 times daily with meals, SSI.   11.  Pancytopenia -Secondary to acute COVID-19 infection. -Patient with no overt bleeding. -Slowly improving.     DVT prophylaxis: Eliquis Code Status: Full Family Communication: Updated patient.  No family at bedside.  Disposition: SNF  Status is: Inpatient. The patient will require care spanning > 2 midnights and should be moved to inpatient because: Severity of illness   Consultants:  None  Procedures:  CT head 02/14/2024 Chest x-ray 02/14/2024 MRI brain 02/14/2024   Antimicrobials:  Anti-infectives (From admission, onward)    None         Subjective: Sitting up in bed.  States he is feeling better.  Only complaint is a sore on his backside patient states.  Denies any chest pain or shortness of breath.  No abdominal pain.  No lightheadedness and dizziness.  Getting ready to eat his lunch.   Objective: Vitals:   02/19/24 2042 02/19/24 2256 02/20/24 0443 02/20/24 0800  BP: (!) 97/58 126/60 109/62 122/61  Pulse: 64 71 61 61  Resp: 18 18  18   Temp: 99 F (37.2 C) 99.4 F (37.4 C) 99.1 F (37.3 C) 98.5 F (36.9 C)  TempSrc: Oral Oral Oral Oral  SpO2: 100% 97% 96%   Weight:        Intake/Output Summary (Last 24 hours) at 02/20/2024 1220 Last data filed at 02/20/2024 0800 Gross per 24 hour  Intake 840 ml  Output 2350 ml  Net -1510 ml   Filed Weights    02/14/24 1952  Weight: 72.1 kg    Examination:  General exam: NAD Respiratory system: Lungs clear to auscultation bilaterally.  No wheezes, no crackles, no rhonchi.  Fair air movement.  Speaking in full sentences.  Cardiovascular system: RRR no murmurs rubs or gallops.  No JVD.  No lower extremity edema.  Gastrointestinal system: Abdomen is soft, nontender, nondistended, positive bowel sounds.  No rebound.  No guarding. Central nervous system: Alert and oriented.  Right upper extremity weakness close to baseline.  Extremities: Symmetric 5 x 5 power. Skin: No rashes, lesions or ulcers Psychiatry: Judgement and insight appear normal. Mood & affect appropriate.     Data Reviewed: I have personally reviewed following labs and imaging studies  CBC: Recent Labs  Lab 02/16/24 0712 02/17/24 0540 02/18/24 0616 02/19/24 0709 02/20/24 0539  WBC 3.0* 2.9* 3.1* 3.5* 3.2*  HGB 12.9* 11.6* 12.5* 12.9* 11.8*  HCT 37.5* 33.9* 35.5* 37.9* 34.0*  MCV 86.6 86.7 85.1 85.4 85.4  PLT 165 128* 133* 134* 130*    Basic Metabolic Panel: Recent Labs  Lab 02/16/24 0711 02/16/24 0712 02/17/24 0540 02/18/24 0616 02/18/24 1142 02/19/24 0709 02/20/24 0539  NA  --  136 139 136  --  137 139  K  --  4.1 3.9 3.8  --  4.1 4.2  CL  --  103 109 104  --  106 107  CO2  --  21* 22 22  --  21* 22  GLUCOSE  --  181* 118* 176* 395* 169* 121*  BUN  --  14 15 14   --  19 21  CREATININE  --  0.99 0.91 0.87  --  1.10 0.93  CALCIUM  --  8.5* 8.0* 7.9*  --  8.1* 8.0*  MG 1.8  --  1.7 1.9  --   --  1.9  PHOS  --   --  3.2  --   --   --  2.8    GFR: Estimated Creatinine Clearance: 62.5 mL/min (by C-G formula based on SCr of 0.93 mg/dL).  Liver Function Tests: Recent Labs  Lab 02/14/24 2245 02/17/24 0540 02/20/24 0539  AST 54*  --   --   ALT 29  --   --   ALKPHOS 53  --   --   BILITOT 1.2  --   --   PROT 5.6*  --   --   ALBUMIN 3.1* 2.5* 2.5*    CBG: Recent Labs  Lab 02/19/24 1249 02/19/24 1453  02/19/24 1711 02/19/24 2121 02/20/24 0613  GLUCAP 366* 256* 172* 117* 119*     Recent Results (from the past 240 hours)  Resp panel by RT-PCR (RSV, Flu A&B, Covid) Anterior Nasal Swab     Status: Abnormal   Collection Time: 02/14/24  8:34 PM   Specimen: Anterior Nasal Swab  Result Value Ref Range Status   SARS Coronavirus 2 by RT PCR POSITIVE (A) NEGATIVE Final   Influenza A by PCR NEGATIVE NEGATIVE Final   Influenza B by PCR NEGATIVE NEGATIVE Final    Comment: (NOTE) The Xpert Xpress SARS-CoV-2/FLU/RSV plus assay is intended as an aid in the diagnosis of influenza from Nasopharyngeal swab specimens and should not be used as a sole basis for treatment. Nasal washings and aspirates are unacceptable for Xpert Xpress SARS-CoV-2/FLU/RSV testing.  Fact Sheet for Patients: BloggerCourse.com  Fact Sheet for Healthcare Providers: SeriousBroker.it  This test is not yet approved or cleared by the [place] FDA and has been authorized for detection and/or diagnosis of SARS-CoV-2 by FDA under an Emergency Use Authorization (EUA). This EUA will remain in effect (meaning this test can be used) for the duration of the COVID-19 declaration under Section 564(b)(1) of the Act, 21 U.S.C. section 360bbb-3(b)(1), unless the authorization is terminated or revoked.     Resp Syncytial Virus by PCR NEGATIVE NEGATIVE Final    Comment: (NOTE) Fact Sheet for Patients: BloggerCourse.com  Fact Sheet for Healthcare Providers: SeriousBroker.it  This test is not yet approved or cleared by the [place] FDA and has been authorized for detection and/or diagnosis of SARS-CoV-2 by FDA under an Emergency Use Authorization (EUA). This EUA will remain in effect (meaning this test can be used) for the duration of the COVID-19 declaration under Section 564(b)(1) of the Act, 21 U.S.C. section  360bbb-3(b)(1), unless the authorization is terminated or revoked.  Performed at [facility], [address] [place], [place] [address]   Culture, blood (Routine X 2) w Reflex to ID Panel     Status: None (Preliminary result)   Collection Time: 02/19/24  5:44 PM   Specimen: BLOOD  Result Value Ref Range Status   Specimen Description BLOOD SITE NOT SPECIFIED  Final   Special Requests   Final    BOTTLES DRAWN AEROBIC AND ANAEROBIC Blood Culture results may not  be optimal due to an inadequate volume of blood received in culture bottles   Culture   Final    NO GROWTH < 24 HOURS Performed at [facility], [address] [place], [place] [address]    Report Status PENDING  Incomplete  Culture, blood (Routine X 2) w Reflex to ID Panel     Status: None (Preliminary result)   Collection Time: 02/19/24  5:44 PM   Specimen: BLOOD  Result Value Ref Range Status   Specimen Description BLOOD SITE NOT SPECIFIED  Final   Special Requests   Final    BOTTLES DRAWN AEROBIC AND ANAEROBIC Blood Culture results may not be optimal due to an inadequate volume of blood received in culture bottles   Culture   Final    NO GROWTH < 24 HOURS Performed at [facility], [address], [place], [place] [address]    Report Status PENDING  Incomplete         Radiology Studies: DG CHEST PORT 1 VIEW Result Date: 02/19/2024 CLINICAL DATA:  [id number] Fever [id number] EXAM: PORTABLE CHEST 1 VIEW COMPARISON:  Chest x-ray 02/14/2024 FINDINGS: The heart and mediastinal contours are unchanged. Atherosclerotic plaque. No focal consolidation. No pulmonary edema. No pleural effusion. No pneumothorax. No acute osseous abnormality. IMPRESSION: No active disease. Electronically Signed   By: Tish Frederickson M.D.   On: 02/19/2024 20:04         Scheduled Meds:  apixaban  5 mg Oral BID   atorvastatin  40 mg Oral Daily   baclofen   Oral BID   benzonatate  100 mg Oral TID   cholecalciferol  1,000 Units  Oral Daily   ferrous sulfate  325 mg Oral QHS   finasteride  5 mg Oral Daily   insulin aspart  0-5 Units Subcutaneous QHS   insulin aspart  0-9 Units Subcutaneous TID WC   insulin aspart  4 Units Subcutaneous TID WC   insulin glargine-yfgn  8 Units Subcutaneous Daily   levothyroxine  25 mcg Oral Q0600   magnesium oxide  200 mg Oral Daily   metoprolol succinate  50 mg Oral QHS   Tafamidis  1 capsule Oral Daily   vitamin B-12  50 mcg Oral Daily   Continuous Infusions:  sodium chloride 75 mL/hr at 02/20/24 0029      LOS: 4 days    Time spent: 35 minutes    Ramiro Harvest, MD [facility]   To contact the attending provider between 7A-7P or the covering provider during after hours 7P-7A, please log into the web site [email or web address] and access using universal [place] password for that web site. If you do not have the password, please call the hospital operator.  02/20/2024, 12:20 PM

## 2024-02-20 NOTE — Progress Notes (Signed)
 Physical Therapy Treatment Patient Details Name: Earl [name] MRN: [id number] DOB: 1941-09-30 Today's Date: 02/20/2024   History of Present Illness Pt is 83 yo presenting to [facility] on 3/26 due to generalized weakness, L lean all day and fall. PMH includes PAF, cardiac amyloidosis, CHF, CVA, DMII, MGUS, HTN, FTT.    PT Comments  Patient resting in bed, reporting pain at site of sacral wound and initially not motivated to mobilize. Education provided on benefits and ability to take pressure off of wound and increase functional strength and mobility and pt agreeable. Mod assist overall for bed mobility with step by step cues for use of bed features. Pt unable to maintain seated balance and required Min-Mod assist to support seated balance. Rt forearm prop completed with cues to shift trunk anterior and move head towards Rt hand improved forward trunk lean. Pt then performed 1-2 minutes of bil UE anterior reaching towards socks. 2 person HHA provided for 2x sit<>stand with Mod assist and blocking of bil LE's. Pt then repositioned in supine and moved to Rt side for pressure relief off wound. RN notified of pt's pain and position. Will continue to progress pt as able. Patient will benefit from continued inpatient follow up therapy, <3 hours/day.     If plan is discharge home, recommend the following: A lot of help with walking and/or transfers;Assistance with cooking/housework;Assist for transportation;Help with stairs or ramp for entrance   Can travel by private vehicle     No  Equipment Recommendations  Hospital bed    Recommendations for Other Services       Precautions / Restrictions Precautions Precautions: Fall;Other (comment) Recall of Precautions/Restrictions: Intact Precaution/Restrictions Comments: COVID Restrictions Weight Bearing Restrictions Per Provider Order: No     Mobility  Bed Mobility Overal bed mobility: Needs Assistance Bed Mobility: Supine to Sit, Rolling, Sit to  Supine Rolling: Min assist, Mod assist, Used rails   Supine to sit: HOB elevated, Used rails, Mod assist Sit to supine: Mod assist, HOB elevated, Used rails   General bed mobility comments: min assist for roll Rt/Lt with cues for bed rail use, Mod assist to bring LE's off/on bed and raise trunk. pt unable to maintain seated balance at EOB without min-mod assist.    Transfers Overall transfer level: Needs assistance Equipment used: 2 person hand held assist Transfers: Sit to/from Stand Sit to Stand: Max assist           General transfer comment: Mod +2 for power up from EOB, 2x sit<>stand with HHA    Ambulation/Gait                   Stairs             Wheelchair Mobility     Tilt Bed    Modified Rankin (Stroke Patients Only)       Balance Overall balance assessment: Needs assistance Sitting-balance support: Bilateral upper extremity supported, Feet supported Sitting balance-Leahy Scale: Poor Sitting balance - Comments: heavy Lt and posterior lean, improve with Rt forearm prop Postural control: Left lateral lean, Posterior lean Standing balance support: Bilateral upper extremity supported, Reliant on assistive device for balance, During functional activity Standing balance-Leahy Scale: Poor Standing balance comment: +2 assist                            Communication Communication Communication: No apparent difficulties  Cognition Arousal: Alert Behavior During Therapy: WFL for tasks assessed/performed   PT -  Cognitive impairments: Safety/Judgement, Sequencing, Initiation, Attention                         Following commands: Impaired Following commands impaired: Follows one step commands with increased time    Cueing Cueing Techniques: Verbal cues  Exercises      General Comments        Pertinent Vitals/Pain Pain Assessment Pain Assessment: Faces Faces Pain Scale: Hurts little more Pain Location: sacral  wound Pain Descriptors / Indicators: Discomfort, Sore Pain Intervention(s): Limited activity within patient's tolerance, Monitored during session, Repositioned (RN notified)    Home Living                          Prior Function            PT Goals (current goals can now be found in the care plan section) Acute Rehab PT Goals Patient Stated Goal: To improve strength and mobility to reduce risk for falls. PT Goal Formulation: With patient/family Time For Goal Achievement: 03/01/24 Potential to Achieve Goals: Fair Progress towards PT goals: Progressing toward goals    Frequency    Min 2X/week      PT Plan      Co-evaluation              AM-PAC PT "6 Clicks" Mobility   Outcome Measure  Help needed turning from your back to your side while in a flat bed without using bedrails?: A Little Help needed moving from lying on your back to sitting on the side of a flat bed without using bedrails?: A Little Help needed moving to and from a bed to a chair (including a wheelchair)?: A Lot Help needed standing up from a chair using your arms (e.g., wheelchair or bedside chair)?: A Lot Help needed to walk in hospital room?: Total Help needed climbing 3-5 steps with a railing? : Total 6 Click Score: 12    End of Session Equipment Utilized During Treatment: Gait belt Activity Tolerance: Patient tolerated treatment well;Patient limited by fatigue Patient left: in bed;with call bell/phone within reach;with bed alarm set;Other (comment) (sacral wound pain) Nurse Communication: Mobility status PT Visit Diagnosis: Other abnormalities of gait and mobility (R26.89);Muscle weakness (generalized) (M62.81);Unsteadiness on feet (R26.81)     Time: 2595-6387 PT Time Calculation (min) (ACUTE ONLY): 29 min  Charges:    $Therapeutic Activity: 23-37 mins PT General Charges $$ ACUTE PT VISIT: 1 Visit                     Wynn Maudlin, DPT Acute Rehabilitation Services Office  [phone]  02/20/24 5:10 PM

## 2024-02-21 ENCOUNTER — Inpatient Hospital Stay (HOSPITAL_COMMUNITY)

## 2024-02-21 DIAGNOSIS — I5022 Chronic systolic (congestive) heart failure: Secondary | ICD-10-CM | POA: Diagnosis not present

## 2024-02-21 DIAGNOSIS — Z7901 Long term (current) use of anticoagulants: Secondary | ICD-10-CM | POA: Diagnosis not present

## 2024-02-21 DIAGNOSIS — R531 Weakness: Secondary | ICD-10-CM

## 2024-02-21 DIAGNOSIS — U071 COVID-19: Secondary | ICD-10-CM | POA: Diagnosis not present

## 2024-02-21 DIAGNOSIS — I480 Paroxysmal atrial fibrillation: Secondary | ICD-10-CM | POA: Diagnosis not present

## 2024-02-21 LAB — BASIC METABOLIC PANEL WITH GFR
Anion gap: 9 (ref 5–15)
BUN: 19 mg/dL (ref 8–23)
CO2: 22 mmol/L (ref 22–32)
Calcium: 8 mg/dL — ABNORMAL LOW (ref 8.9–10.3)
Chloride: 107 mmol/L (ref 98–111)
Creatinine, Ser: 0.98 mg/dL (ref 0.61–1.24)
GFR, Estimated: >60 mL/min (ref >60)
Glucose, Bld: 99 mg/dL (ref 70–99)
Potassium: 4 mmol/L (ref 3.5–5.1)
Sodium: 138 mmol/L (ref 135–145)

## 2024-02-21 LAB — GLUCOSE, CAPILLARY
Glucose-Capillary: 108 mg/dL — ABNORMAL HIGH (ref 70–99)
Glucose-Capillary: 225 mg/dL — ABNORMAL HIGH (ref 70–99)
Glucose-Capillary: 151 mg/dL — ABNORMAL HIGH (ref 70–99)
Glucose-Capillary: 164 mg/dL — ABNORMAL HIGH (ref 70–99)

## 2024-02-21 LAB — CBC
HCT: 35.4 % — ABNORMAL LOW (ref 39.0–52.0)
Hemoglobin: 12.2 g/dL — ABNORMAL LOW (ref 13.0–17.0)
MCH: 29.5 pg (ref 26.0–34.0)
MCHC: 34.5 g/dL (ref 30.0–36.0)
MCV: 85.5 fL (ref 80.0–100.0)
Platelets: 147 10*3/uL — ABNORMAL LOW (ref 150–400)
RBC: 4.14 MIL/uL — ABNORMAL LOW (ref 4.22–5.81)
RDW: 13.6 % (ref 11.5–15.5)
WBC: 3.1 10*3/uL — ABNORMAL LOW (ref 4.0–10.5)
nRBC: 0 % (ref 0.0–0.2)

## 2024-02-21 MED ORDER — INSULIN GLARGINE-YFGN 100 UNIT/ML ~~LOC~~ SOLN
6.0000 [IU] | Freq: Every day | SUBCUTANEOUS | Status: DC
  Administered 2024-02-21 – 2024-02-24 (×4): 6 [IU] via SUBCUTANEOUS
  Filled 2024-02-21 (×4): qty 0.06

## 2024-02-21 NOTE — Plan of Care (Signed)
 Spoke with pt daughter at bedside about pt care and processes that will increase better outcome for patient. Pt did not seem interested in participating in care plan, but pt daughter asking questions and stating that she will discuss next steps with her brother. Pt daughter also stated that she encouraged pt to be more engaged and active with his own care.

## 2024-02-21 NOTE — Progress Notes (Signed)
 [name]                                                                               Amol Domanski, is a 83 y.o. male, DOB - 09/15/1941, KVQ:[id number] Admit date - 02/14/2024    Outpatient Primary MD for the patient is [name], MD  LOS - 5  days    Brief summary   83 year old gentleman with history of type 2 diabetes, hypertension, hyperlipidemia, CHF, A-fib on Eliquis, history of stroke with residual right-sided weakness, MGUS, GERD, BPH, hypothyroidism brought to the ED by EMS due to worsening generalized weakness.  Patient seen in the ED workup concerning for COVID-19 infection.  CT head MRI brain done negative for any acute abnormalities.  Patient admitted, placed on supportive care.       Assessment & Plan    Assessment and Plan:   COVID-19 infection Patient presented with generalized weakness, lethargy.  Patient denies having any respiratory symptoms COVID PCR positive, influenza A and B PCR are negative Chest x-ray is negative for pneumonia. Patient is on room air.    Hyperkalemia S/p Lokelma Resolved Patient was on Aldactone and Entresto which are on hold.   Rhabdomyolysis Renal function stable.   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 Last echo showed LVEF of 45 to 50%. Left ventricle demonstrates global hypokinesis. LVEF diastolic parameters show grade II diastolic dysfunction.  Torsemide, entresto and aldactone are on hold for now in view of multiple episodes of syncope and hypotension.    Insulin dependent diabetes mellitus:  CBG (last 3)  Recent Labs    02/20/24 2137 02/21/24 0629 02/21/24 1205  GLUCAP 76 108* 225*   Resume SSI. A1c IS 6.8%.    Hyperlipidemia Resume statin.    BPH Continue with proscar.   Vasovagal syncope -Patient noted to have a vasovagal episode, 02/16/2024, while sitting on the bedside commode currently back  to baseline. -MRI brain done on admission negative for any acute strokes. -2D echo done 08/2023 with EF of 45 to 50%, left ventricular global hypokinesis, grade 2 DD, no severe valvular abnormalities. -BP noted to be soft on 02/16/2024 and assessed patient hydrated gently with IV fluids.   -BP improved.  -Patient noted to have another vasovagal episode of syncope while on the bedside commode having a large bowel movement per RN on 02/19/2024.  Per RN during episode blood pressure down to 82/71 and after patient placed in bed BP improved to 102/48.   PAF Continue with toprol for rate control. On Eliquis for anti coagulation.    History of stroke with residual right-sided weakness/hyperlipidemia -Continue Lipitor and Eliquis for secondary stroke prophylaxis   Mild Pancytopenia Improving.   Fever:  100.9 over night.  Check blood cultures.  Repeat CXR ordered.  Ua on 3/31 is negative.     Hypothyroidism:  Resume Synthroid.   In view of multiple medical issues, poor progression, will request palliative care for goals of care.    Estimated body mass index is 22.18 kg/m as calculated from the following:   Height as of 01/24/24: 5\' 11"  (1.803 m).  Weight as of this encounter: 72.1 kg.  Code Status:  full code.  DVT Prophylaxis:   apixaban (ELIQUIS) tablet 5 mg   Level of Care: Level of care: Progressive Family Communication: none at bedside.   Disposition Plan:     Remains inpatient appropriate:  pending clinical improvement.   Procedures:  None.   Consultants:   None.   Antimicrobials:   Anti-infectives (From admission, onward)    None        Medications  Scheduled Meds:  apixaban  5 mg Oral BID   atorvastatin  40 mg Oral Daily   baclofen   Oral BID   benzonatate  100 mg Oral TID   cholecalciferol  1,000 Units Oral Daily   ferrous sulfate  325 mg Oral QHS   finasteride  5 mg Oral Daily   insulin aspart  0-5 Units Subcutaneous QHS   insulin aspart  0-9  Units Subcutaneous TID WC   insulin glargine-yfgn  6 Units Subcutaneous Daily   levothyroxine  25 mcg Oral Q0600   magnesium oxide  200 mg Oral Daily   metoprolol succinate  50 mg Oral QHS   Tafamidis  1 capsule Oral Daily   vitamin B-12  50 mcg Oral Daily   Continuous Infusions: PRN Meds:.acetaminophen **OR** acetaminophen, albuterol, bisacodyl, hydrALAZINE, polyethylene glycol    Subjective:   Earl [name] was seen and examined today.   No new complaints.   Objective:   Vitals:   02/20/24 2029 02/20/24 2349 02/21/24 0418 02/21/24 0757  BP: (!) 110/56 (!) 101/54 (!) 118/59 119/60  Pulse: 68 (!) 59 66 69  Resp:    17  Temp: (!) 100.9 F (38.3 C) 98.4 F (36.9 C) 99.4 F (37.4 C) 99.3 F (37.4 C)  TempSrc: Tympanic Oral Oral Oral  SpO2: 100% 99% 100% 99%  Weight:        Intake/Output Summary (Last 24 hours) at 02/21/2024 1152 Last data filed at 02/21/2024 0659 Gross per 24 hour  Intake --  Output 2550 ml  Net -2550 ml   Filed Weights   02/14/24 1952  Weight: 72.1 kg     Exam General exam: Appears calm and comfortable  Respiratory system: Clear to auscultation. Respiratory effort normal. Cardiovascular system: S1 & S2 heard, RRR. No JVD,  Gastrointestinal system: Abdomen is nondistended, soft and nontender.  Central nervous system: Alert and oriented.  Extremities: no edema.  Skin: No rashes,  Psychiatry:  Mood & affect appropriate.    Data Reviewed:  I have personally reviewed following labs and imaging studies   CBC Lab Results  Component Value Date   WBC 3.1 (L) 02/21/2024   RBC 4.14 (L) 02/21/2024   HGB 12.2 (L) 02/21/2024   HCT 35.4 (L) 02/21/2024   MCV 85.5 02/21/2024   MCH 29.5 02/21/2024   PLT 147 (L) 02/21/2024   MCHC 34.5 02/21/2024   RDW 13.6 02/21/2024   LYMPHSABS 1.1 11/14/2023   MONOABS 0.7 11/14/2023   EOSABS 0.1 11/14/2023   BASOSABS 0.0 11/14/2023     Last metabolic panel Lab Results  Component Value Date   NA 138  02/21/2024   K 4.0 02/21/2024   CL 107 02/21/2024   CO2 22 02/21/2024   BUN 19 02/21/2024   CREATININE 0.98 02/21/2024   GLUCOSE 99 02/21/2024   GFRNONAA >60 02/21/2024   GFRAA 77 05/17/2021   CALCIUM 8.0 (L) 02/21/2024   PHOS 2.8 02/20/2024   PROT 5.6 (L) 02/14/2024   ALBUMIN 2.5 (L) 02/20/2024  LABGLOB 2.4 12/22/2021   BILITOT 1.2 02/14/2024   ALKPHOS 53 02/14/2024   AST 54 (H) 02/14/2024   ALT 29 02/14/2024   ANIONGAP 9 02/21/2024    CBG (last 3)  Recent Labs    02/20/24 1647 02/20/24 2137 02/21/24 0629  GLUCAP 158* 76 108*      Coagulation Profile: No results for input(s): "INR", "PROTIME" in the last 168 hours.   Radiology Studies: DG CHEST PORT 1 VIEW Result Date: 02/19/2024 CLINICAL DATA:  [id number] Fever [id number] EXAM: PORTABLE CHEST 1 VIEW COMPARISON:  Chest x-ray 02/14/2024 FINDINGS: The heart and mediastinal contours are unchanged. Atherosclerotic plaque. No focal consolidation. No pulmonary edema. No pleural effusion. No pneumothorax. No acute osseous abnormality. IMPRESSION: No active disease. Electronically Signed   By: Tish Frederickson M.D.   On: 02/19/2024 20:04       Kathlen Mody M.D. [name] 02/21/2024, 11:52 AM  Available via Epic secure chat 7am-7pm After 7 pm, please refer to night coverage provider listed on amion.

## 2024-02-21 NOTE — Plan of Care (Signed)
 In today with OT assisting with patient. No sitting upright on his own. Encouraged to eat. Bites eaten and cup of applesauce eaten. Drinking fluids. Resting most of day. Family in now assisting with supper.     Problem: Coping: Goal: Ability to adjust to condition or change in health will improve Outcome: Progressing   Problem: Nutritional: Goal: Maintenance of adequate nutrition will improve Outcome: Progressing   Problem: Skin Integrity: Goal: Risk for impaired skin integrity will decrease Outcome: Progressing   Problem: Nutrition: Goal: Adequate nutrition will be maintained Outcome: Progressing   Problem: Safety: Goal: Ability to remain free from injury will improve Outcome: Progressing

## 2024-02-21 NOTE — Progress Notes (Signed)
 Occupational Therapy Treatment Patient Details Name: Earl [name] MRN: [id number] DOB: 04-29-1941 Today's Date: 02/21/2024   History of present illness Pt is 83 yo presenting to [facility] on 3/26 due to generalized weakness, L lean all day and fall. PMH includes PAF, cardiac amyloidosis, CHF, CVA, DMII, MGUS, HTN, FTT.   OT comments  Worked with pt to progress sitting balance, Lt posterio-lateral lean still present impacting pt indepenence in maintaining sitting position. Pt progressing to periods of CGA following activities to promote anterior pelvic tilt and engagement of core muscles. Pt not able to initiate offloading or weight shifting of BLEs today despite Max A from OT. OT to continue following pt acutely to progress pt as able, DC plans remain appropriate for skilled rehab <3hrs of inpatient therapy.       If plan is discharge home, recommend the following:  A lot of help with walking and/or transfers;A lot of help with bathing/dressing/bathroom;Assistance with cooking/housework;Assist for transportation;Help with stairs or ramp for entrance   Equipment Recommendations  Other (comment) (defer)    Recommendations for Other Services      Precautions / Restrictions Precautions Precautions: Fall;Other (comment) Recall of Precautions/Restrictions: Intact Precaution/Restrictions Comments: COVID Restrictions Weight Bearing Restrictions Per Provider Order: No       Mobility Bed Mobility Overal bed mobility: Needs Assistance Bed Mobility: Supine to Sit, Sit to Supine     Supine to sit: HOB elevated, Used rails, Mod assist Sit to supine: Mod assist, Used rails   General bed mobility comments: Mod A to assist with BLEs and raise trunk, pt needing cues to sequence bed mobility.    Transfers Overall transfer level: Needs assistance Equipment used: None (face to face) Transfers: Sit to/from Stand Sit to Stand: Max assist           General transfer comment: Max A with  tactile cues to shift hips anteriorly, pt not offloading BLEs or able to initiate weight shifts     Balance Overall balance assessment: Needs assistance Sitting-balance support: Bilateral upper extremity supported, Feet supported Sitting balance-Leahy Scale: Poor Sitting balance - Comments: Lt posterior lateral lean Postural control: Left lateral lean, Posterior lean Standing balance support: Bilateral upper extremity supported, Reliant on assistive device for balance, During functional activity Standing balance-Leahy Scale: Poor                             ADL either performed or assessed with clinical judgement   ADL                       Lower Body Dressing: Maximal assistance;Sitting/lateral leans                 General ADL Comments: focused session on EOB sitting balance activities    Extremity/Trunk Assessment              Vision       Perception     Praxis     Communication Communication Communication: No apparent difficulties   Cognition Arousal: Alert Behavior During Therapy: WFL for tasks assessed/performed Cognition: No family/caregiver present to determine baseline             OT - Cognition Comments: pt slower processing,                 Following commands: Impaired Following commands impaired: Follows one step commands with increased time      Cueing   Cueing  Techniques: Verbal cues  Exercises Other Exercises Other Exercises: Encouraging anterior pelvic tiltic with tactile faciliation of hips and spreading/opening of chest Other Exercises: Long sheet over low back with bilat knees blocked and faciliatation of anterior tilt and assist with return to midline to counteract L lateral lean Other Exercises: Dynamic reaching BUEs with emphasis on anterior lean    Shoulder Instructions       General Comments      Pertinent Vitals/ Pain       Pain Assessment Pain Assessment: Faces Faces Pain Scale: Hurts  little more Pain Location: bottom wound Pain Descriptors / Indicators: Discomfort, Sore Pain Intervention(s): Limited activity within patient's tolerance, Monitored during session, Repositioned  Home Living                                          Prior Functioning/Environment              Frequency  Min 2X/week        Progress Toward Goals  OT Goals(current goals can now be found in the care plan section)  Progress towards OT goals: Progressing toward goals  Acute Rehab OT Goals Patient Stated Goal: to walk OT Goal Formulation: With patient Time For Goal Achievement: 03/02/24 Potential to Achieve Goals: Good  Plan      Co-evaluation                 AM-PAC OT "6 Clicks" Daily Activity     Outcome Measure   Help from another person eating meals?: None Help from another person taking care of personal grooming?: A Little Help from another person toileting, which includes using toliet, bedpan, or urinal?: A Lot Help from another person bathing (including washing, rinsing, drying)?: A Lot Help from another person to put on and taking off regular upper body clothing?: A Little Help from another person to put on and taking off regular lower body clothing?: A Lot 6 Click Score: 16    End of Session    OT Visit Diagnosis: Unsteadiness on feet (R26.81);Other abnormalities of gait and mobility (R26.89);Muscle weakness (generalized) (M62.81);History of falling (Z91.81)   Activity Tolerance Patient tolerated treatment well   Patient Left in bed;with call bell/phone within reach;with bed alarm set   Nurse Communication Mobility status        Time: 1000-1033 OT Time Calculation (min): 33 min  Charges: OT General Charges $OT Visit: 1 Visit OT Treatments $Therapeutic Activity: 23-37 mins  02/21/2024  AB, OTR/L  Acute Rehabilitation Services  Office: [phone]   Tristan Schroeder 02/21/2024, 3:47 PM

## 2024-02-21 NOTE — TOC Progression Note (Signed)
 Transition of Care [facility]) - Progression Note    Patient Details  Name: Earl [name] MRN: [id number] Date of Birth: Jan 25, 1941  [facility] of [facility] CM/SW Contact  Earl [name], [place] Phone Number: 02/21/2024, 2:51 PM  Clinical Narrative:   CSW spoke with daughter, Earl [name], to discuss SNF options. Family would like to choose the bed at [facility], but they are not comfortable with him transitioning to SNF at this time, concerned about the vasovagal episodes that the patient is having when he gets up to the bedside commode. The daughter said that she did not feel like he would be ready to leave the hospital until that is not an issue, concerned that the SNF would send him right back to the hospital if that were to happen when he got there. CSW relayed concern to MD. Lengthy conversation with daughter over the phone to discuss expectations with SNF placement and answer questions, daughter appreciative. CSW updated [facility], confirmed that bed would be available for patient when he is ready for discharge. CSW to follow.    Expected Discharge Plan: Skilled Nursing Facility Barriers to Discharge: English as a second language teacher, Continued Medical Work up, SNF Covid  Expected Discharge Plan and Services     Post Acute Care Choice: Skilled Nursing Facility Living arrangements for the past 2 months: Single Family Home                                       Social Determinants of Health (SDOH) Interventions SDOH Screenings   Food Insecurity: No Food Insecurity (02/15/2024)  Housing: Low Risk  (02/15/2024)  Transportation Needs: No Transportation Needs (02/15/2024)  Utilities: Not At Risk (02/15/2024)  Depression (PHQ2-9): Low Risk  (10/03/2023)  Financial Resource Strain: Patient Declined (01/16/2024)  Physical Activity: Unknown (01/16/2024)  Social Connections: Moderately Integrated (01/16/2024)  Stress: Stress Concern Present (01/16/2024)  Tobacco Use: Low Risk  (02/14/2024)     Readmission Risk Interventions     No data to display

## 2024-02-22 ENCOUNTER — Encounter: Payer: Self-pay | Admitting: Neurology

## 2024-02-22 ENCOUNTER — Other Ambulatory Visit (HOSPITAL_COMMUNITY): Payer: Self-pay

## 2024-02-22 ENCOUNTER — Inpatient Hospital Stay (HOSPITAL_COMMUNITY)

## 2024-02-22 DIAGNOSIS — I480 Paroxysmal atrial fibrillation: Secondary | ICD-10-CM | POA: Diagnosis not present

## 2024-02-22 DIAGNOSIS — Z515 Encounter for palliative care: Secondary | ICD-10-CM | POA: Diagnosis not present

## 2024-02-22 DIAGNOSIS — R55 Syncope and collapse: Secondary | ICD-10-CM | POA: Diagnosis not present

## 2024-02-22 DIAGNOSIS — I5042 Chronic combined systolic (congestive) and diastolic (congestive) heart failure: Secondary | ICD-10-CM

## 2024-02-22 DIAGNOSIS — U071 COVID-19: Secondary | ICD-10-CM | POA: Diagnosis not present

## 2024-02-22 DIAGNOSIS — Z7901 Long term (current) use of anticoagulants: Secondary | ICD-10-CM | POA: Diagnosis not present

## 2024-02-22 DIAGNOSIS — Z7189 Other specified counseling: Secondary | ICD-10-CM | POA: Diagnosis not present

## 2024-02-22 DIAGNOSIS — I5022 Chronic systolic (congestive) heart failure: Secondary | ICD-10-CM | POA: Diagnosis not present

## 2024-02-22 LAB — HEMOGLOBIN A1C
Hgb A1c MFr Bld: 8.4 % — ABNORMAL HIGH (ref 4.8–5.6)
Mean Plasma Glucose: 194.38 mg/dL

## 2024-02-22 LAB — GLUCOSE, CAPILLARY
Glucose-Capillary: 136 mg/dL — ABNORMAL HIGH (ref 70–99)
Glucose-Capillary: 250 mg/dL — ABNORMAL HIGH (ref 70–99)
Glucose-Capillary: 214 mg/dL — ABNORMAL HIGH (ref 70–99)
Glucose-Capillary: 156 mg/dL — ABNORMAL HIGH (ref 70–99)

## 2024-02-22 LAB — BASIC METABOLIC PANEL WITH GFR
Anion gap: 7 (ref 5–15)
BUN: 28 mg/dL — ABNORMAL HIGH (ref 8–23)
CO2: 22 mmol/L (ref 22–32)
Calcium: 7.7 mg/dL — ABNORMAL LOW (ref 8.9–10.3)
Chloride: 108 mmol/L (ref 98–111)
Creatinine, Ser: 1.23 mg/dL (ref 0.61–1.24)
GFR, Estimated: 59 mL/min — ABNORMAL LOW (ref >60)
Glucose, Bld: 272 mg/dL — ABNORMAL HIGH (ref 70–99)
Potassium: 4.4 mmol/L (ref 3.5–5.1)
Sodium: 137 mmol/L (ref 135–145)

## 2024-02-22 LAB — TSH: TSH: 0.863 u[IU]/mL (ref 0.350–4.500)

## 2024-02-22 LAB — CK: Total CK: 740 U/L — ABNORMAL HIGH (ref 49–397)

## 2024-02-22 LAB — AMMONIA: Ammonia: 15 umol/L (ref 9–35)

## 2024-02-22 MED ORDER — ENSURE ENLIVE PO LIQD
237.0000 mL | Freq: Two times a day (BID) | ORAL | Status: DC
  Administered 2024-02-22 – 2024-02-27 (×9): 237 mL via ORAL

## 2024-02-22 MED ORDER — PHENOL 1.4 % MT LIQD: 1.0000 | OROMUCOSAL | Status: DC | PRN

## 2024-02-22 MED ORDER — SODIUM CHLORIDE 0.9 % IV SOLN: INTRAVENOUS | Status: DC

## 2024-02-22 NOTE — Consult Note (Signed)
 Consultation Note Date: 02/22/2024   Patient Name: Earl [name]  DOB: 1941-02-05  MRN: [id number]  Age / Sex: 83 y.o., male  PCP: [name], MD Referring Physician: Kathlen Mody, MD  Reason for Consultation: Establishing goals of care  HPI/Patient Profile: 83 y.o. 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   Upon workup in the ED, patient was positive for COVID and rhabdomyolysis with elevated CK.  He was admitted for supportive care.  Hospitalization complicated by episode of vasovagal syncope.  Heart failure team was consulted. PMT has been consulted day 7 of hospitalization to assist with goals of care conversation.  Clinical Assessment and Goals of Care:  I have reviewed medical records including EPIC notes, labs and imaging, assessed the patient and then met at the bedside with patient's son Apolinar Junes to discuss diagnosis prognosis, GOC, EOL wishes, disposition and options.  Patient's daughter [name] during the conversation by speaker phone.  I introduced Palliative Medicine as specialized medical care for people living with serious illness. It focuses on providing relief from the symptoms and stress of a serious illness. The goal is to improve quality of life for both the patient and the family.  We discussed a brief life review of the patient and then focused on their current illness.   I attempted to elicit values and goals of care important to the patient.    Medical History Review and Understanding:  Patient's family reported good understanding of his acute and chronic illnesses, ongoing workup and diagnostics.  Social History: Patient was living at home with a caregiver 4 days a week and frequent check ins from his daughter.  He also has 1  son.  He is widowed.  Functional and Nutritional State: Family reports patient was declining for the past year or so, though not as severely as rapidly as he has over the past 2 days.  He is eating small bites of his lunch during my visit.  Palliative Symptoms: Lethargy  Advance Directives: A detailed discussion regarding advanced directives was had.  Family reports that daughter [name] is HCPOA.  She will bring documentation at her earliest convenience.  Code Status: Concepts specific to code status, artifical feeding and hydration, and rehospitalization were considered and discussed.  Son believes he may have a DNR on file in the past.  Discussion: Attempted to explore patient's thoughts and feelings on his current illness, his quality of life, and his goals for the future.  Unfortunately, he fatigues easily and is not able to describe this in much detail. He states that he would feel "bad" if he was unable to improve from his current condition. For now, he states "don't give up." Explored his personal limitations and consideration of best case and worse case scenarios. Discussed his thoughts on situations including being unable to talk or walk again. If it was thought with a high degree of medical certainty that he would not regain the ability to speak with others or interact with his environment, then he would feel "that's it."   Patient's family have experience with palliative care and hospice when they lost their mother to cancer. They had not had advance care planning or goals of care discussions with their father recently and agree it would be beneficial to do so. They await more information from patient's medical team  to determine the support and resources he will need moving forward.    The difference between aggressive medical intervention and comfort care was considered in light of the patient's goals of care. Hospice and Palliative Care services outpatient were explained and offered.    Discussed the importance of continued conversation with family and the medical providers regarding overall plan of care and treatment options, ensuring decisions are within the context of the patient's values and GOCs.   Questions and concerns were addressed. The family was encouraged to call with questions or concerns.  PMT will continue to support holistically.   SUMMARY OF RECOMMENDATIONS   -Continue full code/full scope treatment -Patient is interested in aggressive life-prolonging care at this time, having a hard time participating in lengthy conversation due to his lethargy -Does not seem he would find his quality of life to be acceptable if he could not talk or walk -Ongoing goals of care discussions pending clinical course, family meeting TBD pending daughter's work schedule -PMT will continue to follow and support   Prognosis:  Poor long-term prognosis  Discharge Planning: To Be Determined      Primary Diagnoses: Present on Admission:  COVID-19 virus infection  BPH (benign prostatic hyperplasia)  Chronic systolic CHF (congestive heart failure) (HCC)  Diabetic 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 (HCC)  Essential hypertension  Hypothyroidism  Hyperlipidemia  Paroxysmal atrial fibrillation (HCC)   Physical Exam Vitals and nursing note reviewed.  Constitutional:      General: He is not in acute distress.    Appearance: He is ill-appearing.  Cardiovascular:     Rate and Rhythm: Normal rate.  Pulmonary:     Effort: Pulmonary effort is normal.  Neurological:     Mental Status: He is oriented to person, place, and time.  Psychiatric:        Behavior: Behavior is slowed.    Vital Signs: BP 118/65 (BP Location: Left Arm)   Pulse 72   Temp 98.8 F (37.1 C) (Oral)   Resp (!) 22   Wt 72.1 kg   SpO2 99%   BMI 22.18 kg/m  Pain Scale: 0-10   Pain Score: 0-No pain   SpO2: SpO2: 99 % O2 Device:SpO2: 99 % O2 Flow Rate: .    Palliative  Assessment/Data: 30%    MDM: High   Altha Sweitzer Jeni Salles, PA-C  Palliative Medicine Team Team phone # [phone]  Thank you for allowing the Palliative Medicine Team to assist in the care of this patient. Please utilize secure chat with additional questions, if there is no response within 30 minutes please call the above phone number.  Palliative Medicine Team providers are available by phone from 7am to 7pm daily and can be reached through the team cell phone.  Should this patient require assistance outside of these hours, please call the patient's attending physician.

## 2024-02-22 NOTE — Consult Note (Signed)
 Advanced Heart Failure Team Consult Note   Primary Physician: [name], MD Cardiologist:  Sherryl Manges, MD AHF: Dr. Shirlee Latch   Reason for Consultation: Assistance w/ HF GDMT in setting of low BP   HPI:    Earl [name] is seen today for assistance w/ HF GDMT in the setting of low BP, at the request of Dr. Blake Divine, Internal Medicine.   83 y.o. 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 Patient is on Eliquis now for PAF, he had a CVA in 2018.  Echo at that time showed EF 55-60%.  In the summer of 2022, he developed progressive exertional dyspnea. He was seen in the ER and was noted to have mildly elevate HS-TnI without trend, thought to be due to volume overload from CHF.  Echo was done in 9/22 showing EF 25-30%, mild LVH, moderate RV enlargement with normal RV systolic function, PASP 65 mmHg, moderate TR, moderate AI, moderate MR, IVC dilated.  Cardiolite in 9/22 showed EF 27%, no ischemia.       RHC/LHC in 11/22 showed mild nonobstructive CAD, CI 4.36, optimized filling pressures.  Cardiac MRI in 1/23 showed LV EF 45%, mild RV dilation with RV EF 48%, mid-wall patchy LGE in the basal to mid septum, ECV 52% in septum, mild-moderate MR, mild-moderate AI. 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    Seen by Neurology for formal evaluation of peripheral neuropathy and saw Dr. Jomarie Longs for genetics evaluation.  He has started on vutrisiran.    Echo 10./24 EF 45-50%, moderate LVH, mild RV dilation with normal systolic function, PASP 38 mmHg, IVC normal.   He presented to the ED on 3/27 for generalized weakness and low grade fever. Had had recent falls at home. Scr was normal but CK was elevated at 442. He was hyperkalemic at 6.0. UA was negative. Head CT unremarkable for any acute intercranial process but did show atrophy  with chronic microvascular ischemic changes and chronic right cerebellar infarct. Respiratory virus panel positive for COVID. CXR unremarkable. BP normotensive.   He was admitted and treated for rhabdomyolysis and COVID infection, treated w/ supportive care. Diuretics held. Lokelma given for hyperkalemia. On 3/28, pt noted to have vasovagal episode while sitting on the bedside commode. BP was also noted to be soft. HFGDT/ BP active meds were held and he was given gentle IVFs w/ improvement in BP.    His home Westley Foots and torsemide remain on hold. AHF team asked to assist w/ medication management.  He has been followed by Dr. Shirlee Latch. Per chart review/ review of previous office visits, he has low baseline BP, often in the 90s/low 100s.   No labs obtained today, yesterday's labs showed SCr 0.98, BUN 19, K 4.0. He has not had repeat CK level. Am-cortisol has been ordered.    He remains weak. Laying flat w/o orthopnea/dyspnea.   Palliative care has been consulted for GOC discussion.    Home Medications Prior to Admission medications   Medication Sig Start Date End Date Taking? Authorizing Provider  AMVUTTRA 25 MG/0.5ML syringe INJECT 25MG  (0.5ML) SUBCUTANEOUSLY ONCE EVERY 3 MONTHS. ROTATE ADMINISTRATION SITE WITH EACH INJECTION. *REFRIGERATE. DO NOT SHAKE.* 10/24/23  Yes Laurey Morale, MD  atorvastatin (LIPITOR) 40 MG tablet Take  1 tablet  Daily for Cholesterol                                                       /  TAKE                                                       BY                                           MOUTH 01/11/24  Yes Worthy Rancher B, FNP  Baclofen 5 MG TABS Take 0.5 tablets (2.5 mg total) by mouth 2 (two) times daily. 11/16/23  Yes Joseph Art, DO  Cholecalciferol (VITAMIN D PO) Take 1 tablet by mouth daily.    Yes [provider]  clotrimazole-betamethasone (LOTRISONE) cream Apply to affected area 2 to 4 x / day 05/17/23  05/16/24 Yes [name], NP  Cyanocobalamin (B-12) 50 MCG TABS Take 50 mcg by mouth daily.   Yes [provider]  ELIQUIS 5 MG TABS tablet TAKE 1 TABLET BY MOUTH TWICE  DAILY TO PREVENT BLOOD CLOTS 11/09/23  Yes [name], NP  Emollient (AQUAPHOR ADV PROTECT HEALING) OINT Apply small amount to two areas on buttocks daily to prevent breakdown 07/19/23  Yes [name], NP  ferrous sulfate 325 (65 FE) MG tablet Take 325 mg by mouth at bedtime.   Yes [provider]  finasteride (PROSCAR) 5 MG tablet TAKE 1 TABLET BY MOUTH DAILY FOR PROSTATE 09/15/23  Yes [name], NP  levothyroxine (SYNTHROID) 50 MCG tablet TAKE 1 TABLET BY MOUTH DAILY ON  AN EMPTY STOMACH WITH ONLY WATER FOR 30 MINUTES AND NO ANTACID,  MEDS, CALCIUM OR MAGNESIUM FOR 4 HOURS AND AVOID BIOTIN Patient taking differently: Take 25-50 mcg by mouth See admin instructions. Take 1 tablet by mouth on Sun, Tues, Thurs and 1/2 tablet all other days ON AN EMPTY STOMACH WITH ONLY WATER FOR 30 MINUTES AND NO ANTACID,  MEDS, CALCIUM OR MAGNESIUM FOR 4 HOURS AND AVOID BIOTIN 05/23/23  Yes [name], NP  Magnesium 250 MG TABS Take 250 mg by mouth daily.    Yes [provider]  metFORMIN (GLUCOPHAGE-XR) 500 MG 24 hr tablet TAKE 2 TABLETS BY MOUTH TWICE  DAILY WITH MEALS FOR DIABETES 07/31/23  Yes [name], NP  metoprolol succinate (TOPROL-XL) 50 MG 24 hr tablet Take 1 tablet (50 mg total) by mouth at bedtime. NEEDS FOLLOW UP APPOINTMENT FOR MORE REFILLS 01/11/24  Yes Laurey Morale, MD  Multiple Vitamin (MULTIVITAMIN) capsule Take 1 capsule by mouth daily.   Yes [provider]  potassium chloride SA (KLOR-CON M20) 20 MEQ tablet Take 1 tablet by mouth once daily 09/25/23  Yes Milford, Jessica M, FNP  sacubitril-valsartan (ENTRESTO) 24-26 MG Take 1 tablet by mouth 2 (two) times daily. 01/31/24  Yes Bensimhon, Bevelyn Buckles, MD  spironolactone (ALDACTONE) 25 MG tablet TAKE 1 TABLET BY  MOUTH AT BEDTIME 12/12/23  Yes Laurey Morale, MD  Tafamidis [facility]) 61 MG CAPS Take 1 capsule by mouth daily. 04/12/23  Yes Laurey Morale, MD  torsemide (DEMADEX) 20 MG tablet Take  1 tablet   2 x /day  for Fluid Retention & Leg / Ankle Swelling Patient taking differently: Take 20 mg by mouth 2 (two) times daily. for Fluid Retention & Leg / Ankle Swelling 03/23/23  Yes [name], MD  trolamine salicylate (ASPERCREME) 10 % cream Apply 1 application topically daily as needed for muscle pain.   Yes [provider]  VITAMIN A PO Take by mouth. 3000 mg   Yes [provider]  vitamin C (ASCORBIC ACID) 500 MG tablet Take 500 mg by mouth daily.   Yes [provider]  Blood Glucose Monitoring Suppl (ONE TOUCH ULTRA 2) w/Device KIT Check blood sugar 1 time a day-DX-E11.9 03/23/23   [name], MD  glucose blood [facility] ULTRA) test strip Use as instructed 03/23/23   [name], MD  Lancets [facility] DELICA PLUS [place]) MISC CHECK BLOOD SUGAR ONCE A DAY. 03/23/23   [name], MD  Lancets Misc. (ONE TOUCH SURESOFT) MISC Check blood sugar 1 time a day-DX-E11.91 03/23/23   [name], MD  NONFORMULARY OR COMPOUNDED ITEM Please dispense a Rolling Walker.  Dx:R26.81 & G63 12/08/22   [name], DO    Past Medical History: Past Medical History:  Diagnosis Date   Adenomatous colon polyp    Allergy    Anal fissure    Arthritis    BPH (benign prostatic hyperplasia)    Diabetes mellitus (HCC)    GERD (gastroesophageal reflux disease)    Hyperlipidemia    Hypertension    Hypothyroidism    Other testicular hypofunction    Stroke [facility])     Past Surgical History: Past Surgical History:  Procedure Laterality Date   CIRCUMCISION N/A 06/11/2019   Procedure: CIRCUMCISION ADULT;  Surgeon: Jerilee Field, MD;  Location: [facility] ORS;  Service: Urology;  Laterality: N/A;   COLONOSCOPY     INGUINAL HERNIA REPAIR Bilateral    1968 and 2004   LOOP  RECORDER INSERTION N/A 07/28/2017   Procedure: LOOP RECORDER INSERTION;  Surgeon: Duke Salvia, MD;  Location: [facility] INVASIVE CV LAB;  Service: Cardiovascular;  Laterality: N/A;   RIGHT/LEFT HEART CATH AND CORONARY ANGIOGRAPHY N/A 10/07/2021   Procedure: RIGHT/LEFT HEART CATH AND CORONARY ANGIOGRAPHY;  Surgeon: Laurey Morale, MD;  Location: [facility] INVASIVE CV LAB;  Service: Cardiovascular;  Laterality: N/A;   TEE WITHOUT CARDIOVERSION N/A 07/28/2017   Procedure: TRANSESOPHAGEAL ECHOCARDIOGRAM (TEE);  Surgeon: Quintella Reichert, MD;  Location: [facility] ENDOSCOPY;  Service: Cardiovascular;  Laterality: N/A;    Family History: Family History  Problem Relation Age of Onset   Sickle cell anemia Mother    Diabetes Father    Stomach cancer Father    Colon cancer Father    CAD Neg Hx    Esophageal cancer Neg Hx    Rectal cancer Neg Hx     Social History: Social History   Socioeconomic History   Marital status: Widowed    Spouse name: Not on file   Number of children: 2   Years of education: Not on file   Highest education level: Bachelor's degree (e.g., BA, AB, BS)  Occupational History   Occupation: Academic librarian: RETIRED  Tobacco Use   Smoking status: Never   Smokeless tobacco: Never  Vaping Use   Vaping status: Never Used  Substance and Sexual Activity   Alcohol use: No   Drug use: No   Sexual activity: Not on file  Other Topics Concern   Not on file  Social History Narrative   Right handed   Drinks caffeine prn   One story home      Daughter names Para   Social Drivers of Health   Financial Resource Strain:  Patient Declined (01/16/2024)   Overall Financial Resource Strain (CARDIA)    Difficulty of Paying Living Expenses: Patient declined  Food Insecurity: No Food Insecurity (02/15/2024)   Hunger Vital Sign    Worried About Running Out of Food in the Last Year: Never true    Ran Out of Food in the Last Year: Never true  Transportation Needs: No  Transportation Needs (02/15/2024)   PRAPARE - Administrator, Civil Service (Medical): No    Lack of Transportation (Non-Medical): No  Physical Activity: Unknown (01/16/2024)   Exercise Vital Sign    Days of Exercise per Week: 0 days    Minutes of Exercise per Session: Not on file  Stress: Stress Concern Present (01/16/2024)   [employer] of Occupational Health - Occupational Stress Questionnaire    Feeling of Stress : To some extent  Social Connections: Moderately Integrated (01/16/2024)   Social Connection and Isolation Panel [NHANES]    Frequency of Communication with Friends and Family: More than three times a week    Frequency of Social Gatherings with Friends and Family: Once a week    Attends Religious Services: More than 4 times per year    Active Member of [employer] or Organizations: Yes    Attends Banker Meetings: More than 4 times per year    Marital Status: Widowed    Allergies:  Allergies  Allergen Reactions   Penicillins Other (See Comments)    Did it involve swelling of the face/tongue/throat, SOB, or low BP? Unknown Did it involve sudden or severe rash/hives, skin peeling, or any reaction on the inside of your mouth or nose? Unknown Did you need to seek medical attention at a hospital or doctor's office? No When did it last happen?    Over 40 Years Ago   If all above answers are "NO", may proceed with cephalosporin use.      Objective:    Vital Signs:   Temp:  [98.8 F (37.1 C)-100.2 F (37.9 C)] 98.8 F (37.1 C) (04/03 0800) Pulse Rate:  [69-73] 72 (04/03 0800) Resp:  [17-22] 22 (04/03 0800) BP: (99-125)/(51-72) 118/65 (04/03 0800) SpO2:  [96 %-100 %] 99 % (04/03 0800) Last BM Date : 02/19/24  Weight change: Filed Weights   02/14/24 1952  Weight: 72.1 kg    Intake/Output:   Intake/Output Summary (Last 24 hours) at 02/22/2024 1049 Last data filed at 02/22/2024 0326 Gross per 24 hour  Intake --  Output 980 ml  Net -980 ml       Physical Exam    General:  weak/fatigued/chronically ill appearing, lethargic, no respiratory difficulty  HEENT: normal Neck: supple. JVP 8 cm. ++thyromegaly Cor: PMI nondisplaced. Regular rate & rhythm. No rubs, gallops or murmurs. Lungs: clear Abdomen: soft, nontender, nondistended. No hepatosplenomegaly. No bruits or masses. Good bowel sounds. Extremities: no cyanosis, clubbing, rash, edema Neuro: lethargic but arousable    Telemetry   NSR w/ 1st deg AVB, 60s, personally reviewed   EKG    No new EKG to review   Labs   Basic Metabolic Panel: Recent Labs  Lab 02/16/24 0711 02/16/24 0712 02/17/24 0540 02/18/24 0616 02/18/24 1142 02/19/24 0709 02/20/24 0539 02/21/24 0524  NA  --    < > 139 136  --  137 139 138  K  --    < > 3.9 3.8  --  4.1 4.2 4.0  CL  --    < > 109 104  --  106 107  107  CO2  --    < > 22 22  --  21* 22 22  GLUCOSE  --    < > 118* 176* 395* 169* 121* 99  BUN  --    < > 15 14  --  19 21 19   CREATININE  --    < > 0.91 0.87  --  1.10 0.93 0.98  CALCIUM  --    < > 8.0* 7.9*  --  8.1* 8.0* 8.0*  MG 1.8  --  1.7 1.9  --   --  1.9  --   PHOS  --   --  3.2  --   --   --  2.8  --    < > = values in this interval not displayed.    Liver Function Tests: Recent Labs  Lab 02/17/24 0540 02/20/24 0539  ALBUMIN 2.5* 2.5*   No results for input(s): "LIPASE", "AMYLASE" in the last 168 hours. No results for input(s): "AMMONIA" in the last 168 hours.  CBC: Recent Labs  Lab 02/17/24 0540 02/18/24 0616 02/19/24 0709 02/20/24 0539 02/21/24 0524  WBC 2.9* 3.1* 3.5* 3.2* 3.1*  HGB 11.6* 12.5* 12.9* 11.8* 12.2*  HCT 33.9* 35.5* 37.9* 34.0* 35.4*  MCV 86.7 85.1 85.4 85.4 85.5  PLT 128* 133* 134* 130* 147*    Cardiac Enzymes: Recent Labs  Lab 02/16/24 0712  CKTOTAL 669*    BNP: BNP (last 3 results) Recent Labs    08/23/23 1541  BNP 99.2    ProBNP (last 3 results) No results for input(s): "PROBNP" in the last 8760  hours.   CBG: Recent Labs  Lab 02/21/24 0629 02/21/24 1205 02/21/24 1641 02/21/24 2147 02/22/24 0616  GLUCAP 108* 225* 151* 164* 136*    Coagulation Studies: No results for input(s): "LABPROT", "INR" in the last 72 hours.   Imaging   DG CHEST PORT 1 VIEW Result Date: 02/21/2024 CLINICAL DATA:  Fever, COVID positive 02/15/2024 EXAM: PORTABLE CHEST 1 VIEW COMPARISON:  02/19/2024 FINDINGS: Single frontal view of the chest demonstrates a stable cardiac silhouette. No acute airspace disease, effusion, or pneumothorax. No acute bony abnormalities. IMPRESSION: 1. No acute intrathoracic process. Electronically Signed   By: Sharlet Salina M.D.   On: 02/21/2024 17:51     Medications:     Current Medications:  apixaban  5 mg Oral BID   baclofen   Oral BID   benzonatate  100 mg Oral TID   cholecalciferol  1,000 Units Oral Daily   ferrous sulfate  325 mg Oral QHS   finasteride  5 mg Oral Daily   insulin aspart  0-5 Units Subcutaneous QHS   insulin aspart  0-9 Units Subcutaneous TID WC   insulin glargine-yfgn  6 Units Subcutaneous Daily   levothyroxine  25 mcg Oral Q0600   magnesium oxide  200 mg Oral Daily   metoprolol succinate  50 mg Oral QHS   Tafamidis  1 capsule Oral Daily   vitamin B-12  50 mcg Oral Daily    Infusions:     Patient Profile   83 y/o male w/ 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 Hospital course c/b low BP and syncope. AHF team consulted to assist w/ management of HF regimen.   Assessment/Plan   1. Chronic Combined Systolic and Diastolic Heart Failure 2/2 TTR  Amyloid  - NICM, LHC 2022 mild nonobstructive CAD - Cardiac MRI in 1/23 showed LV EF 45%, mild RV dilation with RV EF 48%, mid-wall patchy LGE in the basal to mid septum, ECV 52% in septum, mild-moderate MR,  mild-moderate AI. 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 Most recent echo 10/24 EF  45-50%, moderate LVH, nl RV SFx - he has associated autonomic dysfunction/neuropathy (orthostatic symptoms) and low BP at baseline. So above symptoms of syncope/near syncope are not new. He is not volume deplete as evidence of visible JVD on exam and normal SCr/BUN. His hyperkalemia has resolved, K now at 4.0. I think we can at least get him back on spironolactone 12.5 mg daily if CK levels are ok   - he needs to wear TED hoses and may even need abdominal binder to prevent further syncopal episodes - we can switch from Entresto to low dose losartan prior to d/c - continue Toprol XL 50 mg at bedtime  - would keep off torsemide for now until PO intake improves  - on Tafamidis for TTR amyloid. Not on formulary. Will see if family can bring from home - on Amvutrra injections, biweekly    2. COVID 19 - supportive care per IM  3. Rhabdomyolysis - CK [id number]   -no AKI - received IVFs - holding home atorvastatin - repeat CK level in process   4. Thyromegaly  - TSH in process, IM to follow    5. MGUS - diagnosed in 2004 and had been followed by Dr. Clelia Croft but I don't see any recent f/u. Unclear if he has had any disease progression to MM  Given age, fragility and comorbidities, I think palliative care consultation is appropriate.     Length of Stay: [address], [place]  02/22/2024, 10:49 AM  Advanced Heart Failure Team Pager [phone] (M-F; 7a - 5p)  Please contact [facility] for night-coverage after hours (4p -7a ) and weekends on [email or web address]

## 2024-02-22 NOTE — Consult Note (Signed)
 NEUROLOGY CONSULT NOTE   Date of service: February 22, 2024 Patient Name: Earl [name] MRN:  [id number] DOB:  19-Feb-1941 Chief Complaint: "Generalized lethargy" Requesting Provider: Kathlen Mody, MD  History of Present Illness  Earl [name] is a 83 y.o. male with hx of type 2 diabetes, hypertension, hyperlipidemia, CHF, A-fib on Eliquis, history of stroke with residual right-sided weakness, MGUS, GERD, BPH, hypothyroidism brought to the ED by EMS due to worsening generalized weakness.  Patient seen in the ED workup concerning for COVID-19 infection.  CT head MRI brain done negative for any acute abnormalities.   Neurology was consulted for generalized lethargy.  At bedside, patient sleepy but easily aroused.  Alert and oriented to name months year.  He is able to follow complex commands.    ROS  Comprehensive ROS performed and pertinent positives documented in HPI    Past History   Past Medical History:  Diagnosis Date   Adenomatous colon polyp    Allergy    Anal fissure    Arthritis    BPH (benign prostatic hyperplasia)    Diabetes mellitus (HCC)    GERD (gastroesophageal reflux disease)    Hyperlipidemia    Hypertension    Hypothyroidism    Other testicular hypofunction    Stroke [facility])     Past Surgical History:  Procedure Laterality Date   CIRCUMCISION N/A 06/11/2019   Procedure: CIRCUMCISION ADULT;  Surgeon: Jerilee Field, MD;  Location: [facility] ORS;  Service: Urology;  Laterality: N/A;   COLONOSCOPY     INGUINAL HERNIA REPAIR Bilateral    1968 and 2004   LOOP RECORDER INSERTION N/A 07/28/2017   Procedure: LOOP RECORDER INSERTION;  Surgeon: Duke Salvia, MD;  Location: [facility] INVASIVE CV LAB;  Service: Cardiovascular;  Laterality: N/A;   RIGHT/LEFT HEART CATH AND CORONARY ANGIOGRAPHY N/A 10/07/2021   Procedure: RIGHT/LEFT HEART CATH AND CORONARY ANGIOGRAPHY;  Surgeon: Laurey Morale, MD;  Location: [facility] INVASIVE CV LAB;  Service: Cardiovascular;  Laterality: N/A;    TEE WITHOUT CARDIOVERSION N/A 07/28/2017   Procedure: TRANSESOPHAGEAL ECHOCARDIOGRAM (TEE);  Surgeon: Quintella Reichert, MD;  Location: [facility] ENDOSCOPY;  Service: Cardiovascular;  Laterality: N/A;    Family History: Family History  Problem Relation Age of Onset   Sickle cell anemia Mother    Diabetes Father    Stomach cancer Father    Colon cancer Father    CAD Neg Hx    Esophageal cancer Neg Hx    Rectal cancer Neg Hx     Social History  reports that he has never smoked. He has never used smokeless tobacco. He reports that he does not drink alcohol and does not use drugs.  Allergies  Allergen Reactions   Penicillins Other (See Comments)    Did it involve swelling of the face/tongue/throat, SOB, or low BP? Unknown Did it involve sudden or severe rash/hives, skin peeling, or any reaction on the inside of your mouth or nose? Unknown Did you need to seek medical attention at a hospital or doctor's office? No When did it last happen?    Over 69 Years Ago   If all above answers are "NO", may proceed with cephalosporin use.      Medications   Current Facility-Administered Medications:    0.9 %  sodium chloride infusion, , Intravenous, Continuous, Akula, Vijaya, MD, Last Rate: 75 mL/hr at 02/22/24 1756, Infusion Verify at 02/22/24 1756   acetaminophen (TYLENOL) tablet 650 mg, 650 mg, Oral, Q6H PRN, 650 mg at 02/20/24 2143 **OR**  acetaminophen (TYLENOL) suppository 650 mg, 650 mg, Rectal, Q6H PRN, Rodolph Bong, MD   albuterol (PROVENTIL) (2.5 MG/3ML) 0.083% nebulizer solution 2.5 mg, 2.5 mg, Nebulization, Q6H PRN, Dahal, Melina Schools, MD   apixaban (ELIQUIS) tablet 5 mg, 5 mg, Oral, BID, Dahal, Binaya, MD, 5 mg at 02/22/24 0859   benzonatate (TESSALON) capsule 100 mg, 100 mg, Oral, TID, Dahal, Binaya, MD, 100 mg at 02/22/24 1751   bisacodyl (DULCOLAX) EC tablet 5 mg, 5 mg, Oral, Daily PRN, Dahal, Melina Schools, MD, 5 mg at 02/16/24 0422   cholecalciferol (VITAMIN D3) 25 MCG (1000 UNIT) tablet  1,000 Units, 1,000 Units, Oral, Daily, Rodolph Bong, MD, 1,000 Units at 02/22/24 0859   feeding supplement (ENSURE ENLIVE / ENSURE PLUS) liquid 237 mL, 237 mL, Oral, BID BM, Kathlen Mody, MD, 237 mL at 02/22/24 1751   ferrous sulfate tablet 325 mg, 325 mg, Oral, QHS, Rodolph Bong, MD, 325 mg at 02/21/24 2228   finasteride (PROSCAR) tablet 5 mg, 5 mg, Oral, Daily, Dahal, Binaya, MD, 5 mg at 02/22/24 0858   insulin aspart (novoLOG) injection 0-5 Units, 0-5 Units, Subcutaneous, QHS, Dahal, Melina Schools, MD, 2 Units at 02/18/24 2202   insulin aspart (novoLOG) injection 0-9 Units, 0-9 Units, Subcutaneous, TID WC, Dahal, Binaya, MD, 3 Units at 02/22/24 1751   insulin glargine-yfgn (SEMGLEE) injection 6 Units, 6 Units, Subcutaneous, Daily, Kathlen Mody, MD, 6 Units at 02/22/24 0902   levothyroxine (SYNTHROID) tablet 25 mcg, 25 mcg, Oral, Q0600, Dahal, Melina Schools, MD, 25 mcg at 02/22/24 4540   magnesium oxide (MAG-OX) tablet 200 mg, 200 mg, Oral, Daily, Utomwen, Adesuwa, RPH, 200 mg at 02/22/24 0858   metoprolol succinate (TOPROL-XL) 24 hr tablet 50 mg, 50 mg, Oral, QHS, Dahal, Binaya, MD, 50 mg at 02/21/24 2228   phenol (CHLORASEPTIC) mouth spray 1 spray, 1 spray, Mouth/Throat, PRN, Kathlen Mody, MD   polyethylene glycol (MIRALAX / GLYCOLAX) packet 17 g, 17 g, Oral, Daily PRN, Dahal, Melina Schools, MD, 17 g at 02/19/24 1417   Tafamidis CAPS 61 mg- HOME MED, 1 capsule, Oral, Daily, Dahal, Binaya, MD, 61 mg at 02/22/24 0910   vitamin B-12 (CYANOCOBALAMIN) tablet 50 mcg, 50 mcg, Oral, Daily, Utomwen, Adesuwa, RPH, 50 mcg at 02/22/24 0859  Vitals   Vitals:   02/22/24 0413 02/22/24 0800 02/22/24 1230 02/22/24 1554  BP: (!) 114/51 118/65 (!) 113/55 (!) 121/59  Pulse: 69 72 62 66  Resp:  (!) 22 20   Temp: 99.5 F (37.5 C) 98.8 F (37.1 C) 98.2 F (36.8 C) 98.3 F (36.8 C)  TempSrc: Oral Oral Oral Axillary  SpO2: 96% 99% 99% 100%  Weight:        Body mass index is 22.18 kg/m.  Physical Exam    Constitutional: Appears cachectic.  Psych: Affect appropriate to situation.  Eyes: No scleral injection.  HENT: No OP obstruction.  Head: Normocephalic.  Cardiovascular: Normal rate and regular rhythm.  Respiratory: Effort normal, non-labored breathing.  GI: Soft.  No distension. There is no tenderness.  Skin: WDI.   Neurologic Examination   Mental Status -  Sleepy but easily arousable, oriented to self month and year.  Able to follow complex commands.  No dysarthria or aphasia.  Cranial Nerves II - XII - II - Visual field intact OU. III, IV, VI - Extraocular movements intact. V - Facial sensation intact bilaterally. VII - Facial movement intact bilaterally. VIII - Hearing & vestibular intact bilaterally. X - Palate elevates symmetrically. XI - Chin turning & shoulder shrug  intact bilaterally. XII - Tongue protrusion intact.  Motor Strength - The patient's strength was 4/5 in all extremities and pronator drift was absent.  Bulk was normal and fasciculations were absent.   Motor Tone - Muscle tone was assessed at the neck and appendages and was normal.  Reflexes - The patient's reflexes were mildly diminished, +1, symmetrical in all extremities, negative Babinski sign.  Sensory - Light touch, temperature/pinprick were assessed and were symmetrical.     Gait and Station - deferred.   Labs/Imaging/Neurodiagnostic studies   CBC:  Recent Labs  Lab March 06, 2024 0539 02/21/24 0524  WBC 3.2* 3.1*  HGB 11.8* 12.2*  HCT 34.0* 35.4*  MCV 85.4 85.5  PLT 130* 147*   Basic Metabolic Panel:  Lab Results  Component Value Date   NA 137 02/22/2024   K 4.4 02/22/2024   CO2 22 02/22/2024   GLUCOSE 272 (H) 02/22/2024   BUN 28 (H) 02/22/2024   CREATININE 1.23 02/22/2024   CALCIUM 7.7 (L) 02/22/2024   GFRNONAA 59 (L) 02/22/2024   GFRAA 77 05/17/2021   Lipid Panel:  Lab Results  Component Value Date   LDLCALC 52 10/31/2023   HgbA1c:  Lab Results  Component Value Date    HGBA1C 8.4 (H) 02/22/2024   Urine Drug Screen:     Component Value Date/Time   LABOPIA NONE DETECTED 07/25/2017 1911   COCAINSCRNUR NONE DETECTED 07/25/2017 1911   LABBENZ NONE DETECTED 07/25/2017 1911   AMPHETMU NONE DETECTED 07/25/2017 1911   THCU NONE DETECTED 07/25/2017 1911   LABBARB NONE DETECTED 07/25/2017 1911    Alcohol Level     Component Value Date/Time   ETH <5 07/25/2017 1720   INR  Lab Results  Component Value Date   INR 1.0 03/09/2020   APTT  Lab Results  Component Value Date   APTT 22 (L) 07/25/2017   AED levels: No results found for: "PHENYTOIN", "ZONISAMIDE", "LAMOTRIGINE", "LEVETIRACETA"  CT Head without contrast(Personally reviewed):    MRI Brain(Personally reviewed): 1. No evidence of acute intracranial abnormality. 2. Remote right cerebellar infarct.  ASSESSMENT   COVID-19 infection Generalized lethargy likely secondary to #1 Toxic metabolic encephalopathy likely related to #1 -On exam, patient does not have any focal neurological deficits.  Additionally MRI brain and recent CT head looks reassuring.  At this time his generalized weakness is likely secondary to COVID-19 infection.  In older patients with significant brain atrophy, acute delirium may persist for up to 2 weeks after resolution of inciting event.   RECOMMENDATIONS  -Delirium precautions -AMS labs including B12, folate, ammonia, TSH, RPR, HIV -Recommend keeping patient engaged in daytime and minimizing interruptions at night. ______________________________________________________________________    Signed, Anibal Henderson, MD [facility]

## 2024-02-22 NOTE — Progress Notes (Signed)
 [name]                                                                               Earl [name], is a 83 y.o. male, DOB - Jun 29, 1941, GNF:[id number] Admit date - 02/14/2024    Outpatient Primary MD for the patient is [name], MD  LOS - 6  days    Brief summary   83 year old gentleman with history of type 2 diabetes, hypertension, hyperlipidemia, CHF, A-fib on Eliquis, history of stroke with residual right-sided weakness, MGUS, GERD, BPH, hypothyroidism brought to the ED by EMS due to worsening generalized weakness.  Patient seen in the ED workup concerning for COVID-19 infection.  CT head MRI brain done negative for any acute abnormalities.   Unfortunately patient continues to be lethargic, with minimal interaction,.    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 ? From polyneuropathy from amyloidosis vs from COVID 19 infection.  On Baclofen which was stopped today. He appears more lethargic, was able to say a few words, able to raise his arms and squeeze my fingers.  Initial MRI of the brain is negative. As per the family, patient has decreased interaction earlier today.  Will get CT brain for further evaluation.     COVID-19 infection Patient presented with generalized weakness, lethargy.  Patient denies having any respiratory symptoms COVID PCR positive, influenza A and B PCR are negative Chest x-ray is negative for pneumonia. Patient is on room air.    Hyperkalemia S/p Lokelma Resolved Patient was on Aldactone and Entresto which are on hold.   Rhabdomyolysis  CK levels continue to remain elevated. Will start him on gentle hydration and monitor CK levels.  Renal function stable.   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 Last echo showed LVEF of 45 to 50%. Left ventricle demonstrates global hypokinesis. LVEF diastolic parameters show grade II diastolic dysfunction.  Torsemide, entresto and aldactone are on hold for now in view of multiple episodes of syncope and hypotension.    Insulin dependent diabetes mellitus:  CBG (last 3)  Recent Labs    02/21/24 1641 02/21/24 2147 02/22/24 0616  GLUCAP 151* 164* 136*   Resume SSI. A1c IS 6.8%.    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 Last follow up with Dr Clelia Croft in 2023.  Obtain MM panel.     Hyperlipidemia Resume statin.    BPH Continue with proscar.   Vasovagal syncope in the setting of autonomic neuropathy -Patient noted to have a vasovagal episode, 02/16/2024, while sitting on the bedside commode currently back to baseline. -MRI brain done on admission negative for any acute strokes. -2D echo done 08/2023 with EF of 45 to 50%, left ventricular global hypokinesis, grade 2 DD, no severe valvular abnormalities. -BP noted to be soft on 02/16/2024 and assessed patient hydrated gently with IV fluids.   -BP improved.  -Patient noted to have another  vasovagal episode of syncope while on the bedside commode having a large bowel movement per RN on 02/19/2024.  Per RN during episode blood pressure down to 82/71 and after patient placed in bed BP improved to 102/48.   PAF Continue with toprol for rate control. On Eliquis for anti coagulation.    History of stroke with residual right-sided weakness/hyperlipidemia -holding lipitor for elevated CK levels.  Continue with eliquis    Mild Pancytopenia ? From COVID INFECTION. Improving.   Fever:  Low grade 100.2 last night. Blood cultures have been negative.  CXR remains negative for pneumonia.  UA is negative.     Hypothyroidism:  Resume Synthroid. TSH wnl.  In view of multiple medical issues,  poor progression, will request palliative care for goals of care.    Estimated body mass index is 22.18 kg/m as calculated from the following:   Height as of 01/24/24: 5\' 11"  (1.803 m).   Weight as of this encounter: 72.1 kg.  Code Status:  full code.  DVT Prophylaxis:   apixaban (ELIQUIS) tablet 5 mg   Level of Care: Level of care: Progressive Family Communication: none at bedside. Discussed with son and daughter over the phone.   Disposition Plan:     Remains inpatient appropriate:  pending clinical improvement.   Procedures:  MRI brain without contrast.   Consultants:   Cardiology Palliative care consult.  Antimicrobials:   Anti-infectives (From admission, onward)    None        Medications  Scheduled Meds:  apixaban  5 mg Oral BID   atorvastatin  40 mg Oral Daily   baclofen   Oral BID   benzonatate  100 mg Oral TID   cholecalciferol  1,000 Units Oral Daily   ferrous sulfate  325 mg Oral QHS   finasteride  5 mg Oral Daily   insulin aspart  0-5 Units Subcutaneous QHS   insulin aspart  0-9 Units Subcutaneous TID WC   insulin glargine-yfgn  6 Units Subcutaneous Daily   levothyroxine  25 mcg Oral Q0600   magnesium oxide  200 mg Oral Daily   metoprolol succinate  50 mg Oral QHS   Tafamidis  1 capsule Oral Daily   vitamin B-12  50 mcg Oral Daily   Continuous Infusions: PRN Meds:.acetaminophen **OR** acetaminophen, albuterol, bisacodyl, hydrALAZINE, polyethylene glycol    Subjective:   Earl [name] was seen and examined today.   More lethargic.  Objective:   Vitals:   02/21/24 1642 02/21/24 2247 02/22/24 0026 02/22/24 0413  BP: (!) 105/53 (!) 112/54 (!) 99/54 (!) 114/51  Pulse: 73 73 70 69  Resp: 18     Temp: 99.2 F (37.3 C) 100.2 F (37.9 C) 99.5 F (37.5 C) 99.5 F (37.5 C)  TempSrc: Axillary Oral Oral Oral  SpO2: 99% 99% 96% 96%  Weight:        Intake/Output Summary (Last 24 hours) at 02/22/2024 0850 Last data filed at 02/22/2024  0326 Gross per 24 hour  Intake --  Output 980 ml  Net -980 ml   Filed Weights   02/14/24 1952  Weight: 72.1 kg     Exam General exam: Ill appearing gentleman, lethargic, minimally responding to questions.  Respiratory system: Clear to auscultation. Respiratory effort normal. Cardiovascular system: S1 & S2 heard, RRR.  Gastrointestinal system: Abdomen is nondistended, soft and nontender.  Central nervous system: Lethargic, able to follow simple commands, minimally responding to questions.  Extremities:  Skin: No rashes,  Psychiatry: unable to assess  due to lethargy.      Data Reviewed:  I have personally reviewed following labs and imaging studies   CBC Lab Results  Component Value Date   WBC 3.1 (L) 02/21/2024   RBC 4.14 (L) 02/21/2024   HGB 12.2 (L) 02/21/2024   HCT 35.4 (L) 02/21/2024   MCV 85.5 02/21/2024   MCH 29.5 02/21/2024   PLT 147 (L) 02/21/2024   MCHC 34.5 02/21/2024   RDW 13.6 02/21/2024   LYMPHSABS 1.1 11/14/2023   MONOABS 0.7 11/14/2023   EOSABS 0.1 11/14/2023   BASOSABS 0.0 11/14/2023     Last metabolic panel Lab Results  Component Value Date   NA 138 02/21/2024   K 4.0 02/21/2024   CL 107 02/21/2024   CO2 22 02/21/2024   BUN 19 02/21/2024   CREATININE 0.98 02/21/2024   GLUCOSE 99 02/21/2024   GFRNONAA >60 02/21/2024   GFRAA 77 05/17/2021   CALCIUM 8.0 (L) 02/21/2024   PHOS 2.8 02/20/2024   PROT 5.6 (L) 02/14/2024   ALBUMIN 2.5 (L) 02/20/2024   LABGLOB 2.4 12/22/2021   BILITOT 1.2 02/14/2024   ALKPHOS 53 02/14/2024   AST 54 (H) 02/14/2024   ALT 29 02/14/2024   ANIONGAP 9 02/21/2024    CBG (last 3)  Recent Labs    02/21/24 1641 02/21/24 2147 02/22/24 0616  GLUCAP 151* 164* 136*      Coagulation Profile: No results for input(s): "INR", "PROTIME" in the last 168 hours.   Radiology Studies: DG CHEST PORT 1 VIEW Result Date: 02/21/2024 CLINICAL DATA:  Fever, COVID positive 02/15/2024 EXAM: PORTABLE CHEST 1 VIEW COMPARISON:   02/19/2024 FINDINGS: Single frontal view of the chest demonstrates a stable cardiac silhouette. No acute airspace disease, effusion, or pneumothorax. No acute bony abnormalities. IMPRESSION: 1. No acute intrathoracic process. Electronically Signed   By: Sharlet Salina M.D.   On: 02/21/2024 17:51       Kathlen Mody M.D. [name] 02/22/2024, 8:50 AM  Available via Epic secure chat 7am-7pm After 7 pm, please refer to night coverage provider listed on amion.

## 2024-02-22 NOTE — Progress Notes (Signed)
 Physical Therapy Treatment Patient Details Name: Earl [name] MRN: [id number] DOB: 1941/06/06 Today's Date: 02/22/2024   History of Present Illness Pt is 83 yo presenting to [facility] on 3/26 due to generalized weakness, L lean all day and fall. PMH includes PAF, cardiac amyloidosis, CHF, CVA, DMII, MGUS, HTN, FTT.    PT Comments  Pt currently is not progressing towards goals. Pt requires Max A for bed mobility and sit to stand with heavy multi modal cueing and heavy physical assist. Pt is experiencing more difficulty following directions and initiating movements for functional activities. Pt has supportive family present during session. Due to pt current functional status, home set up and available assistance at home recommending skilled physical therapy services < 3 hours/day in order to address strength, balance and functional mobility to decrease risk for falls, injury, immobility, skin break down and re-hospitalization.      If plan is discharge home, recommend the following: Assistance with cooking/housework;Assist for transportation;Help with stairs or ramp for entrance;A lot of help with walking and/or transfers   Can travel by private vehicle     No  Equipment Recommendations  Hospital bed       Precautions / Restrictions Precautions Precautions: Fall;Other (comment) Recall of Precautions/Restrictions: Intact Precaution/Restrictions Comments: COVID Restrictions Weight Bearing Restrictions Per Provider Order: No     Mobility  Bed Mobility Overal bed mobility: Needs Assistance Bed Mobility: Supine to Sit, Sit to Supine     Supine to sit: HOB elevated, Used rails, Max assist Sit to supine: Max assist   General bed mobility comments: Max A with multi modal cues for sequencing, need for tactile cues and physical assist to initiate movement. Max A for bil LE and trunk for sitting to supine.    Transfers Overall transfer level: Needs assistance Equipment used: 2 person hand  held assist, Rolling walker (2 wheels) Transfers: Sit to/from Stand Sit to Stand: Max assist           General transfer comment: Performed sit to stand at Max A 2x from EOB with heavy posterior lean and then tried front to front HHA sit to stand with Max A  into standing and heavy posterior lean.    Ambulation/Gait   General Gait Details: unable to progress this date due to pt current functional mobility      Balance Overall balance assessment: Needs assistance Sitting-balance support: Bilateral upper extremity supported, Feet supported Sitting balance-Leahy Scale: Poor Sitting balance - Comments: R posterior lateral lean Postural control: Posterior lean, Right lateral lean Standing balance support: Bilateral upper extremity supported, Reliant on assistive device for balance, During functional activity Standing balance-Leahy Scale: Zero Standing balance comment: heavy assist to prevent posterior fall        Communication Communication Communication: Impaired Factors Affecting Communication: Difficulty expressing self  Cognition Arousal: Lethargic Behavior During Therapy: Flat affect   PT - Cognitive impairments: Safety/Judgement, Sequencing, Initiation, Attention     PT - Cognition Comments: Pt cognition seems to be getting worse. Pt with more difficulty following directions. Answering questions and commands less than 50% of the time. Following commands: Impaired Following commands impaired: Follows one step commands with increased time, Follows one step commands inconsistently    Cueing Cueing Techniques: Verbal cues, Tactile cues, Visual cues     General Comments General comments (skin integrity, edema, etc.): Vital signs stable. Son and DIL present throughout session      Pertinent Vitals/Pain Pain Assessment Pain Assessment: No/denies pain Pain Intervention(s): Monitored during session  PT Goals (current goals can now be found in the care plan section)  Acute Rehab PT Goals Patient Stated Goal: To improve strength and mobility to reduce risk for falls. PT Goal Formulation: With patient/family Time For Goal Achievement: 03/01/24 Potential to Achieve Goals: Fair Progress towards PT goals: Not progressing toward goals - comment (Pt currently has regressed in functional mobility)    Frequency    Min 2X/week      PT Plan  Continue with current POC        AM-PAC PT "6 Clicks" Mobility   Outcome Measure  Help needed turning from your back to your side while in a flat bed without using bedrails?: A Lot Help needed moving from lying on your back to sitting on the side of a flat bed without using bedrails?: A Lot Help needed moving to and from a bed to a chair (including a wheelchair)?: A Lot Help needed standing up from a chair using your arms (e.g., wheelchair or bedside chair)?: A Lot Help needed to walk in hospital room?: Total Help needed climbing 3-5 steps with a railing? : Total 6 Click Score: 10    End of Session Equipment Utilized During Treatment: Gait belt Activity Tolerance: Patient limited by lethargy;Patient limited by fatigue Patient left: in bed;with call bell/phone within reach;with bed alarm set;with family/visitor present Nurse Communication: Mobility status PT Visit Diagnosis: Other abnormalities of gait and mobility (R26.89);Muscle weakness (generalized) (M62.81);Unsteadiness on feet (R26.81)     Time: 2841-3244 PT Time Calculation (min) (ACUTE ONLY): 31 min  Charges:    $Therapeutic Activity: 23-37 mins PT General Charges $$ ACUTE PT VISIT: 1 Visit                     Harrel Carina, DPT, CLT  Acute Rehabilitation Services Office: [phone] (Secure chat preferred)    Claudia Desanctis 02/22/2024, 4:02 PM

## 2024-02-22 NOTE — Plan of Care (Signed)
 Alert and oriented x4, daughter is at bedside. Comfortably resting, no other significant changes happen throughout this time.  Problem: Education: Goal: Knowledge of risk factors and measures for prevention of condition will improve Outcome: Progressing   Problem: Respiratory: Goal: Will maintain a patent airway Outcome: Progressing   Problem: Nutritional: Goal: Maintenance of adequate nutrition will improve Outcome: Progressing   Problem: Metabolic: Goal: Ability to maintain appropriate glucose levels will improve Outcome: Progressing   Problem: Activity: Goal: Risk for activity intolerance will decrease Outcome: Progressing   Problem: Safety: Goal: Ability to remain free from injury will improve Outcome: Progressing   Problem: Skin Integrity: Goal: Risk for impaired skin integrity will decrease Outcome: Progressing

## 2024-02-23 DIAGNOSIS — U071 COVID-19: Secondary | ICD-10-CM | POA: Diagnosis not present

## 2024-02-23 DIAGNOSIS — I480 Paroxysmal atrial fibrillation: Secondary | ICD-10-CM | POA: Diagnosis not present

## 2024-02-23 DIAGNOSIS — Z7901 Long term (current) use of anticoagulants: Secondary | ICD-10-CM | POA: Diagnosis not present

## 2024-02-23 DIAGNOSIS — E039 Hypothyroidism, unspecified: Secondary | ICD-10-CM | POA: Diagnosis not present

## 2024-02-23 LAB — GLUCOSE, CAPILLARY
Glucose-Capillary: 121 mg/dL — ABNORMAL HIGH (ref 70–99)
Glucose-Capillary: 382 mg/dL — ABNORMAL HIGH (ref 70–99)
Glucose-Capillary: 249 mg/dL — ABNORMAL HIGH (ref 70–99)
Glucose-Capillary: 169 mg/dL — ABNORMAL HIGH (ref 70–99)

## 2024-02-23 LAB — CBC WITH DIFFERENTIAL/PLATELET
Abs Immature Granulocytes: 0 10*3/uL (ref 0.00–0.07)
Basophils Absolute: 0 10*3/uL (ref 0.0–0.1)
Basophils Relative: 0 %
Eosinophils Absolute: 0 10*3/uL (ref 0.0–0.5)
Eosinophils Relative: 0 %
HCT: 34 % — ABNORMAL LOW (ref 39.0–52.0)
Hemoglobin: 11.7 g/dL — ABNORMAL LOW (ref 13.0–17.0)
Lymphocytes Relative: 19 %
Lymphs Abs: 0.7 10*3/uL (ref 0.7–4.0)
MCH: 29.4 pg (ref 26.0–34.0)
MCHC: 34.4 g/dL (ref 30.0–36.0)
MCV: 85.4 fL (ref 80.0–100.0)
Monocytes Absolute: 0.4 10*3/uL (ref 0.1–1.0)
Monocytes Relative: 9 %
Neutro Abs: 2.8 10*3/uL (ref 1.7–7.7)
Neutrophils Relative %: 72 %
Platelets: 169 10*3/uL (ref 150–400)
RBC: 3.98 MIL/uL — ABNORMAL LOW (ref 4.22–5.81)
RDW: 13.6 % (ref 11.5–15.5)
WBC: 3.9 10*3/uL — ABNORMAL LOW (ref 4.0–10.5)
nRBC: 0 % (ref 0.0–0.2)
nRBC: 0 /100{WBCs}

## 2024-02-23 LAB — BASIC METABOLIC PANEL WITH GFR
Anion gap: 9 (ref 5–15)
BUN: 25 mg/dL — ABNORMAL HIGH (ref 8–23)
CO2: 21 mmol/L — ABNORMAL LOW (ref 22–32)
Calcium: 7.7 mg/dL — ABNORMAL LOW (ref 8.9–10.3)
Chloride: 110 mmol/L (ref 98–111)
Creatinine, Ser: 1.07 mg/dL (ref 0.61–1.24)
GFR, Estimated: >60 mL/min (ref >60)
Glucose, Bld: 118 mg/dL — ABNORMAL HIGH (ref 70–99)
Potassium: 4.2 mmol/L (ref 3.5–5.1)
Sodium: 140 mmol/L (ref 135–145)

## 2024-02-23 LAB — HEPATIC FUNCTION PANEL
ALT: 39 U/L (ref 0–44)
AST: 54 U/L — ABNORMAL HIGH (ref 15–41)
Albumin: 1.8 g/dL — ABNORMAL LOW (ref 3.5–5.0)
Alkaline Phosphatase: 46 U/L (ref 38–126)
Bilirubin, Direct: 0.1 mg/dL (ref 0.0–0.2)
Indirect Bilirubin: 0.3 mg/dL (ref 0.3–0.9)
Total Bilirubin: 0.4 mg/dL (ref 0.0–1.2)
Total Protein: 4.6 g/dL — ABNORMAL LOW (ref 6.5–8.1)

## 2024-02-23 LAB — IGG, IGA, IGM
IgA: 286 mg/dL (ref 61–437)
IgG (Immunoglobin G), Serum: 504 mg/dL — ABNORMAL LOW (ref 603–1613)
IgM (Immunoglobulin M), Srm: 16 mg/dL (ref 15–143)

## 2024-02-23 LAB — CORTISOL-AM, BLOOD: Cortisol - AM: 10.6 ug/dL (ref 6.7–22.6)

## 2024-02-23 MED ORDER — SPIRONOLACTONE 12.5 MG HALF TABLET
12.5000 mg | ORAL_TABLET | Freq: Every day | ORAL | Status: DC
  Administered 2024-02-23 – 2024-02-24 (×2): 12.5 mg via ORAL
  Filled 2024-02-23 (×3): qty 1

## 2024-02-23 MED ORDER — METOPROLOL SUCCINATE ER 25 MG PO TB24
25.0000 mg | ORAL_TABLET | Freq: Every day | ORAL | Status: DC
  Administered 2024-02-23 – 2024-02-28 (×6): 25 mg via ORAL
  Filled 2024-02-23 (×6): qty 1

## 2024-02-23 NOTE — Progress Notes (Signed)
 [name]                                                                               Earl [name], is a 83 y.o. male, DOB - 11-Jun-1941, RUE:[id number] Admit date - 02/14/2024    Outpatient Primary MD for the patient is [name], MD  LOS - 7  days    Brief summary   83 year old gentleman with history of type 2 diabetes, hypertension, hyperlipidemia, CHF, A-fib on Eliquis, history of stroke with residual right-sided weakness, MGUS, GERD, BPH, hypothyroidism brought to the ED by EMS due to worsening generalized weakness.  Patient seen in the ED workup concerning for COVID-19 infection.  CT head MRI brain done negative for any acute abnormalities.   Unfortunately patient continues to be lethargic, with minimal interaction,.    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 ? From polyneuropathy from amyloidosis vs from COVID 19 infection.  On Baclofen which was stopped  on 4/3.   Initial MRI of the brain is negative. Repeat CT head is negative for acute stroke.  Patient continuing to work with PT.     COVID-19 infection Patient presented with generalized weakness, lethargy.  Patient denies having any respiratory symptoms COVID PCR positive, influenza A and B PCR are negative Chest x-ray is negative for pneumonia. Patient is on room air.    Hyperkalemia S/p Lokelma Resolved Patient was on Aldactone and Entresto which are on hold.   Rhabdomyolysis  CK levels continue to remain elevated. Will start him on gentle hydration and monitor CK levels.  Renal function stable.   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 Last echo showed LVEF of 45 to 50%. Left ventricle demonstrates  global hypokinesis. LVEF diastolic parameters show grade II diastolic dysfunction.  Torsemide, entresto and aldactone are on hold for now in view of multiple episodes of syncope and hypotension.    Insulin dependent diabetes mellitus:  CBG (last 3)  Recent Labs    02/22/24 2116 02/23/24 0618 02/23/24 1208  GLUCAP 156* 121* 382*   Resume SSI. A1c IS 6.8%.    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 Last follow up with Dr Clelia Croft in 2023.  Obtain MM panel.     Hyperlipidemia Resume statin.    BPH Continue with proscar.   Vasovagal syncope in the setting of autonomic neuropathy -Patient noted to have a vasovagal episode, 02/16/2024, while sitting on the bedside commode currently back to baseline. -MRI brain done on admission negative for any acute strokes. -2D echo done 08/2023 with EF of 45 to 50%, left ventricular global hypokinesis, grade 2 DD, no severe valvular abnormalities. -BP noted to be soft on 02/16/2024 and assessed patient hydrated gently with IV fluids.   -BP improved.  -Patient noted to have another vasovagal episode of syncope while on the bedside commode having a large bowel movement per RN on 02/19/2024.   -  BP parameters have improved and we continue to hold entresto, torsemide.    PAF Continue with toprol for rate control. On Eliquis for anti coagulation.    History of stroke with residual right-sided weakness/hyperlipidemia -holding lipitor for elevated CK levels.  Continue with eliquis    Mild Pancytopenia ? From COVID INFECTION. Improving.   Fever:  Work up has been negative.     Hypothyroidism:  Resume Synthroid. TSH wnl.  In view of multiple medical issues, poor progression, will request palliative care for goals of care.    Estimated body mass index is 22.18 kg/m as calculated from the following:   Height as of 01/24/24: 5\' 11"  (1.803 m).   Weight as of this encounter: 72.1 kg.  Code Status:  full  code.  DVT Prophylaxis:  Place TED hose Start: 02/22/24 1448 apixaban (ELIQUIS) tablet 5 mg   Level of Care: Level of care: Progressive Family Communication: none at bedside today.   Disposition Plan:     Remains inpatient appropriate:  pending clinical improvement.   Procedures:  MRI brain without contrast.   Consultants:   Cardiology Palliative care consult.  Antimicrobials:   Anti-infectives (From admission, onward)    None        Medications  Scheduled Meds:  apixaban  5 mg Oral BID   benzonatate  100 mg Oral TID   cholecalciferol  1,000 Units Oral Daily   feeding supplement  237 mL Oral BID BM   ferrous sulfate  325 mg Oral QHS   finasteride  5 mg Oral Daily   insulin aspart  0-5 Units Subcutaneous QHS   insulin aspart  0-9 Units Subcutaneous TID WC   insulin glargine-yfgn  6 Units Subcutaneous Daily   levothyroxine  25 mcg Oral Q0600   magnesium oxide  200 mg Oral Daily   metoprolol succinate  25 mg Oral QHS   Tafamidis  1 capsule Oral Daily   vitamin B-12  50 mcg Oral Daily   Continuous Infusions:  sodium chloride 75 mL/hr at 02/23/24 0345   PRN Meds:.acetaminophen **OR** acetaminophen, albuterol, bisacodyl, phenol, polyethylene glycol    Subjective:   Earl [name] was seen and examined today.   More alert today . No new complaints.  Objective:   Vitals:   02/23/24 0024 02/23/24 0340 02/23/24 0846 02/23/24 1205  BP: 115/61 (!) 111/59 (!) 109/59 114/62  Pulse: 63 60 63 66  Resp: 18 18 19 17   Temp: 99.3 F (37.4 C) 99.2 F (37.3 C) 98.6 F (37 C) 98 F (36.7 C)  TempSrc: Oral Oral Oral Oral  SpO2: 98% 98% 98% 100%  Weight:        Intake/Output Summary (Last 24 hours) at 02/23/2024 1400 Last data filed at 02/23/2024 1200 Gross per 24 hour  Intake 2536.55 ml  Output 600 ml  Net 1936.55 ml   Filed Weights   02/14/24 1952  Weight: 72.1 kg     Exam General exam: ill appearing gentleman, not in distress.  Respiratory system: Clear  to auscultation. Respiratory effort normal. Cardiovascular system: S1 & S2 heard, RRR. Gastrointestinal system: Abdomen is nondistended, soft and nontender.  Central nervous system: Alert and oriented to person and place only.  Extremities: warm extremities.  Skin: No rashes seen Psychiatry: unable to assess.       Data Reviewed:  I have personally reviewed following labs and imaging studies   CBC Lab Results  Component Value Date   WBC 3.9 (L) 02/23/2024   RBC 3.98 (  L) 02/23/2024   HGB 11.7 (L) 02/23/2024   HCT 34.0 (L) 02/23/2024   MCV 85.4 02/23/2024   MCH 29.4 02/23/2024   PLT 169 02/23/2024   MCHC 34.4 02/23/2024   RDW 13.6 02/23/2024   LYMPHSABS 0.7 02/23/2024   MONOABS 0.4 02/23/2024   EOSABS 0.0 02/23/2024   BASOSABS 0.0 02/23/2024     Last metabolic panel Lab Results  Component Value Date   NA 140 02/23/2024   K 4.2 02/23/2024   CL 110 02/23/2024   CO2 21 (L) 02/23/2024   BUN 25 (H) 02/23/2024   CREATININE 1.07 02/23/2024   GLUCOSE 118 (H) 02/23/2024   GFRNONAA >60 02/23/2024   GFRAA 77 05/17/2021   CALCIUM 7.7 (L) 02/23/2024   PHOS 2.8 02/20/2024   PROT 4.6 (L) 02/23/2024   ALBUMIN 1.8 (L) 02/23/2024   LABGLOB 2.4 12/22/2021   BILITOT 0.4 02/23/2024   ALKPHOS 46 02/23/2024   AST 54 (H) 02/23/2024   ALT 39 02/23/2024   ANIONGAP 9 02/23/2024    CBG (last 3)  Recent Labs    02/22/24 2116 02/23/24 0618 02/23/24 1208  GLUCAP 156* 121* 382*      Coagulation Profile: No results for input(s): "INR", "PROTIME" in the last 168 hours.   Radiology Studies: CT HEAD WO CONTRAST ( ) Result Date: 02/22/2024 CLINICAL DATA:  Altered mental status EXAM: CT HEAD WITHOUT CONTRAST TECHNIQUE: Contiguous axial images were obtained from the base of the skull through the vertex without intravenous contrast. RADIATION DOSE REDUCTION: This exam was performed according to the departmental dose-optimization program which includes automated exposure control,  adjustment of the mA and/or kV according to patient size and/or use of iterative reconstruction technique. COMPARISON:  02/14/2024 FINDINGS: Brain: Of old right cerebellar infarct. Vascular: No hyperdense vessel or unexpected vascular calcification. Skull: The visualized skull base, calvarium and extracranial soft tissues are normal. Sinuses/Orbits: No fluid levels or advanced mucosal thickening of the visualized paranasal sinuses. No mastoid or middle ear effusion. Normal orbits. Other: None. IMPRESSION: 1. No acute intracranial abnormality. 2. Old right cerebellar infarct. Electronically Signed   By: Deatra Robinson M.D.   On: 02/22/2024 19:09       Kathlen Mody M.D. [name] 02/23/2024, 2:00 PM  Available via Epic secure chat 7am-7pm After 7 pm, please refer to night coverage provider listed on amion.

## 2024-02-23 NOTE — TOC Progression Note (Signed)
 Transition of Care [facility]) - Progression Note    Patient Details  Name: Earl [name] MRN: [id number] Date of Birth: 12-30-1940  Transition of [name] CM/SW Contact  Baldemar Lenis, [place] Phone Number: 02/23/2024, 1:20 PM  Clinical Narrative:   CSW following for transition to SNF when medically stable, medical workup ongoing.    Expected Discharge Plan: Skilled Nursing Facility Barriers to Discharge: English as a second language teacher, Continued Medical Work up, SNF Covid  Expected Discharge Plan and Services     Post Acute Care Choice: Skilled Nursing Facility Living arrangements for the past 2 months: Single Family Home                                       Social Determinants of Health (SDOH) Interventions SDOH Screenings   Food Insecurity: No Food Insecurity (02/15/2024)  Housing: Low Risk  (02/15/2024)  Transportation Needs: No Transportation Needs (02/15/2024)  Utilities: Not At Risk (02/15/2024)  Depression (PHQ2-9): Low Risk  (10/03/2023)  Financial Resource Strain: Patient Declined (01/16/2024)  Physical Activity: Unknown (01/16/2024)  Social Connections: Moderately Integrated (01/16/2024)  Stress: Stress Concern Present (01/16/2024)  Tobacco Use: Low Risk  (02/14/2024)    Readmission Risk Interventions     No data to display

## 2024-02-23 NOTE — Plan of Care (Signed)

## 2024-02-23 NOTE — Progress Notes (Signed)
 Occupational Therapy Treatment Patient Details Name: Earl [name] MRN: [id number] DOB: 1941-10-07 Today's Date: 02/23/2024   History of present illness Pt is 83 yo [facility] due to generalized weakness, L lean all day and fall. PMH includes PAF, cardiac amyloidosis, CHF, CVA, DMII, MGUS, HTN, FTT.   OT comments  Pt making gradual progress towards goals. Session focused on trial of Stedy to maximize OOB transfers and combat posterior bias noted in prior sessions. Overall, pt requires Max A for bed mobility but able to stand in [place] with Mod A x 2 from various surface heights. Repositioned in chair to prevent R lateral lean with chair alarm active and all needs in reach. DC recs remain appropriate.      If plan is discharge home, recommend the following:  A lot of help with walking and/or transfers;A lot of help with bathing/dressing/bathroom;Assistance with cooking/housework;Assist for transportation;Help with stairs or ramp for entrance;Two people to help with walking and/or transfers   Equipment Recommendations  Other (comment) (TBD)    Recommendations for Other Services      Precautions / Restrictions Precautions Precautions: Fall;Other (comment) Recall of Precautions/Restrictions: Intact Restrictions Weight Bearing Restrictions Per Provider Order: No       Mobility Bed Mobility Overal bed mobility: Needs Assistance Bed Mobility: Supine to Sit     Supine to sit: Max assist, HOB elevated, Used rails     General bed mobility comments: cues for bedrail use, able to assist LE some towards EOB. Assist to lift trunk    Transfers Overall transfer level: Needs assistance Equipment used: Ambulation equipment used Transfers: Sit to/from Stand, Bed to chair/wheelchair/BSC Sit to Stand: Mod assist, +2 physical assistance, +2 safety/equipment, From elevated surface           General transfer comment: Able to stand from elevated bed using Stedy with mod A x  2 and transferred to chair with this equipment. Trialed standing from lower height with pt able to lift bottom 25% off of chair before assist needed to tuck bottom in. Transfer via Lift Equipment: Stedy   Balance Overall balance assessment: Needs assistance Sitting-balance support: Feet supported, No upper extremity supported, Single extremity supported, Bilateral upper extremity supported Sitting balance-Leahy Scale: Fair     Standing balance support: Bilateral upper extremity supported, Reliant on assistive device for balance, During functional activity Standing balance-Leahy Scale: Poor                             ADL either performed or assessed with clinical judgement   ADL Overall ADL's : Needs assistance/impaired     Grooming: Set up;Sitting               Lower Body Dressing: Total assistance;Bed level                 General ADL Comments: Emphasis on trial of Stedy to correct posterior lean    Extremity/Trunk Assessment Upper Extremity Assessment Upper Extremity Assessment: Generalized weakness;Right hand dominant   Lower Extremity Assessment Lower Extremity Assessment: Defer to PT evaluation        Vision   Vision Assessment?: No apparent visual deficits   Perception     Praxis     Communication Communication Communication: Impaired Factors Affecting Communication: Difficulty expressing self   Cognition Arousal: Alert Behavior During Therapy: Flat affect, WFL for tasks assessed/performed Cognition: No family/caregiver present to determine baseline  OT - Cognition Comments: pt slower processing but pleasant and follows directions well.                 Following commands: Impaired Following commands impaired: Follows one step commands with increased time, Follows multi-step commands inconsistently      Cueing   Cueing Techniques: Verbal cues, Tactile cues, Visual cues  Exercises      Shoulder  Instructions       General Comments Minor R lateral lean noted in chair. repositioned pillows to correct.    Pertinent Vitals/ Pain       Pain Assessment Pain Assessment: Faces Faces Pain Scale: Hurts a little bit Pain Location: bottom wound Pain Descriptors / Indicators: Discomfort, Sore Pain Intervention(s): Monitored during session, Repositioned  Home Living                                          Prior Functioning/Environment              Frequency  Min 2X/week        Progress Toward Goals  OT Goals(current goals can now be found in the care plan section)  Progress towards OT goals: Progressing toward goals  Acute Rehab OT Goals Patient Stated Goal: be able to walk with walker again, agreeable for rehab OT Goal Formulation: With patient Time For Goal Achievement: 03/02/24 Potential to Achieve Goals: Good ADL Goals Pt Will Perform Grooming: with contact guard assist;standing Pt Will Perform Upper Body Dressing: with supervision;sitting Pt Will Perform Lower Body Dressing: sit to/from stand;with contact guard assist Pt Will Transfer to Toilet: with contact guard assist;ambulating;regular height toilet Pt Will Perform Toileting - Clothing Manipulation and hygiene: sit to/from stand;with contact guard assist  Plan      Co-evaluation                 AM-PAC OT "6 Clicks" Daily Activity     Outcome Measure   Help from another person eating meals?: None Help from another person taking care of personal grooming?: A Little Help from another person toileting, which includes using toliet, bedpan, or urinal?: A Lot Help from another person bathing (including washing, rinsing, drying)?: A Lot Help from another person to put on and taking off regular upper body clothing?: A Little Help from another person to put on and taking off regular lower body clothing?: A Lot 6 Click Score: 16    End of Session Equipment Utilized During Treatment:  Gait belt  OT Visit Diagnosis: Unsteadiness on feet (R26.81);Other abnormalities of gait and mobility (R26.89);Muscle weakness (generalized) (M62.81);History of falling (Z91.81)   Activity Tolerance Patient tolerated treatment well   Patient Left in chair;with call bell/phone within reach;with chair alarm set   Nurse Communication Mobility status        Time: 4098-1191 OT Time Calculation (min): 32 min  Charges: OT General Charges $OT Visit: 1 Visit OT Treatments $Therapeutic Activity: 23-37 mins  Bradd Canary, OTR/L Acute Rehab Services Office: [phone]   Lorre Munroe 02/23/2024, 11:54 AM

## 2024-02-23 NOTE — Inpatient Diabetes Management (Signed)
 Inpatient Diabetes Program Recommendations  AACE/ADA: New Consensus Statement on Inpatient Glycemic Control (2015)  Target Ranges:  Prepandial:   less than 140 mg/dL      Peak postprandial:   less than 180 mg/dL (1-2 hours)      Critically ill patients:  140 - 180 mg/dL   Lab Results  Component Value Date   GLUCAP 382 (H) 02/23/2024   HGBA1C 8.4 (H) 02/22/2024    Review of Glycemic Control  Diabetes history: DM2 Outpatient Diabetes medications: metformin 500 mg BID Current orders for Inpatient glycemic control: Semglee 6 units daily, Novolog 0-9 TID with meals and 0-5 HS  HgbA1C - 8.4%  Inpatient Diabetes Program Recommendations:   Consider adding Novolog 4 units TID with meals if eating > 50%   Continue to follow.  Thank you. Ailene Ards, RD, LDN, CDCES Inpatient Diabetes Coordinator [phone]

## 2024-02-24 DIAGNOSIS — R531 Weakness: Secondary | ICD-10-CM | POA: Diagnosis not present

## 2024-02-24 DIAGNOSIS — E039 Hypothyroidism, unspecified: Secondary | ICD-10-CM | POA: Diagnosis not present

## 2024-02-24 DIAGNOSIS — W19XXXA Unspecified fall, initial encounter: Secondary | ICD-10-CM | POA: Diagnosis not present

## 2024-02-24 DIAGNOSIS — Z7189 Other specified counseling: Secondary | ICD-10-CM

## 2024-02-24 DIAGNOSIS — I480 Paroxysmal atrial fibrillation: Secondary | ICD-10-CM | POA: Diagnosis not present

## 2024-02-24 DIAGNOSIS — Z7901 Long term (current) use of anticoagulants: Secondary | ICD-10-CM | POA: Diagnosis not present

## 2024-02-24 DIAGNOSIS — Z515 Encounter for palliative care: Secondary | ICD-10-CM | POA: Diagnosis not present

## 2024-02-24 DIAGNOSIS — U071 COVID-19: Secondary | ICD-10-CM | POA: Diagnosis not present

## 2024-02-24 DIAGNOSIS — I5042 Chronic combined systolic (congestive) and diastolic (congestive) heart failure: Secondary | ICD-10-CM | POA: Diagnosis not present

## 2024-02-24 DIAGNOSIS — R638 Other symptoms and signs concerning food and fluid intake: Secondary | ICD-10-CM

## 2024-02-24 DIAGNOSIS — Z789 Other specified health status: Secondary | ICD-10-CM

## 2024-02-24 LAB — BASIC METABOLIC PANEL WITH GFR
Anion gap: 9 (ref 5–15)
BUN: 21 mg/dL (ref 8–23)
CO2: 20 mmol/L — ABNORMAL LOW (ref 22–32)
Calcium: 7.6 mg/dL — ABNORMAL LOW (ref 8.9–10.3)
Chloride: 109 mmol/L (ref 98–111)
Creatinine, Ser: 1.18 mg/dL (ref 0.61–1.24)
GFR, Estimated: >60 mL/min (ref >60)
Glucose, Bld: 215 mg/dL — ABNORMAL HIGH (ref 70–99)
Potassium: 4.1 mmol/L (ref 3.5–5.1)
Sodium: 138 mmol/L (ref 135–145)

## 2024-02-24 LAB — CULTURE, BLOOD (ROUTINE X 2)
Culture: NO GROWTH
Culture: NO GROWTH

## 2024-02-24 LAB — GLUCOSE, CAPILLARY
Glucose-Capillary: 131 mg/dL — ABNORMAL HIGH (ref 70–99)
Glucose-Capillary: 315 mg/dL — ABNORMAL HIGH (ref 70–99)
Glucose-Capillary: 264 mg/dL — ABNORMAL HIGH (ref 70–99)
Glucose-Capillary: 138 mg/dL — ABNORMAL HIGH (ref 70–99)

## 2024-02-24 MED ORDER — INSULIN GLARGINE-YFGN 100 UNIT/ML ~~LOC~~ SOLN
7.0000 [IU] | Freq: Every day | SUBCUTANEOUS | Status: DC
  Administered 2024-02-25 – 2024-02-27 (×3): 7 [IU] via SUBCUTANEOUS
  Filled 2024-02-24 (×3): qty 0.07

## 2024-02-24 MED ORDER — INSULIN ASPART 100 UNIT/ML IJ SOLN
2.0000 [IU] | Freq: Three times a day (TID) | INTRAMUSCULAR | Status: DC
  Administered 2024-02-24: 2 [IU] via SUBCUTANEOUS

## 2024-02-24 MED ORDER — INSULIN ASPART 100 UNIT/ML IJ SOLN
4.0000 [IU] | Freq: Three times a day (TID) | INTRAMUSCULAR | Status: DC
  Administered 2024-02-25 – 2024-02-27 (×7): 4 [IU] via SUBCUTANEOUS

## 2024-02-24 NOTE — Plan of Care (Signed)

## 2024-02-24 NOTE — Progress Notes (Signed)
 [name]                                                                               [name], is a 83 y.o. male, DOB - [birth year], ZOX:[id number] Admit date - 02/14/2024    Outpatient Primary MD for the patient is Earl Sheffield, MD  LOS - 8  days    Brief summary   83 year old gentleman with history of type 2 diabetes, hypertension, hyperlipidemia, CHF, A-fib on Eliquis, history of stroke with residual right-sided weakness, MGUS, GERD, BPH, hypothyroidism brought to the ED by EMS due to worsening generalized weakness.  Patient seen in the ED workup concerning for COVID-19 infection.  CT head MRI brain done negative for any acute abnormalities.   Unfortunately patient continues to be lethargic, with minimal interaction,.    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 ? From polyneuropathy from amyloidosis vs from COVID 19 infection.  On Baclofen which was stopped  on 4/3.  He is more alert and conversing and oriented.  Initial MRI of the brain is negative. Repeat CT head is negative for acute stroke.  Patient continuing to work with PT.     COVID-19 infection Patient presented with generalized weakness, lethargy.  Patient denies having any respiratory symptoms COVID PCR positive, influenza A and B PCR are negative Chest x-ray is negative for pneumonia. Patient is on room air.    Hyperkalemia S/p Lokelma Resolved Patient was on Aldactone and Entresto which are on hold.   Rhabdomyolysis  CK levels continue to remain elevated. Will start him on gentle hydration and monitor CK levels.  Renal function stable. Repeat CK levels in am.    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 Last echo showed LVEF of 45 to 50%. Left ventricle demonstrates global hypokinesis. LVEF diastolic parameters show grade II diastolic dysfunction.  Torsemide, entresto are on hold for now in view of multiple episodes of syncope and hypotension.  Cardiology on board and recommending to continue with spironolactone at a low dose.    Insulin dependent diabetes mellitus:  CBG (last 3)  Recent Labs    02/24/24 0610 02/24/24 1223 02/24/24 1551  GLUCAP 131* 315* 264*   Resume SSI. A1c IS 6.8%. increase to 7 units of Semglee and add 4 units of novolog TIDAC to SSI.    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 Last follow up with Dr Clelia Croft in 2023.  Obtain MM panel.     Hyperlipidemia Resume statin.    BPH Continue with proscar.   Vasovagal syncope in the setting of autonomic neuropathy -Patient noted to have a vasovagal episode, 02/16/2024, while sitting on the bedside commode currently back to baseline. -MRI brain done on admission negative for any acute strokes. -2D echo done 08/2023 with EF of 45 to 50%, left ventricular global hypokinesis, grade 2 DD, no severe valvular abnormalities. -BP noted to be  soft on 02/16/2024 and assessed patient hydrated gently with IV fluids.   -BP improved.  -Patient noted to have another vasovagal episode of syncope while on the bedside commode having a large bowel movement per RN on 02/19/2024.   - BP parameters have improved and we continue to hold entresto, torsemide.  - check orthostatic vital signs with PT next session pls.    PAF Continue with toprol for rate control. On Eliquis for anti coagulation.    History of stroke with residual right-sided weakness/hyperlipidemia -holding lipitor for elevated CK levels.  Continue with eliquis    Mild Pancytopenia ? From COVID INFECTION. Improving.   Fever:  Work up has been negative.     Hypothyroidism:  Resume Synthroid. TSH wnl.  In view of  multiple medical issues, poor progression,requested palliative care for goals of care.  Plan for outpatient palliative care services to follow on discharge to SNF when stable.    Estimated body mass index is 22.18 kg/m as calculated from the following:   Height as of 01/24/24: 5\' 11"  (1.803 m).   Weight as of this encounter: 72.1 kg.  Code Status:  full code.  DVT Prophylaxis:  Place TED hose Start: 02/22/24 1448 apixaban (ELIQUIS) tablet 5 mg   Level of Care: Level of care: Progressive Family Communication: none at bedside today.   Disposition Plan:     Remains inpatient appropriate:  pending clinical improvement.   Procedures:  MRI brain without contrast.   Consultants:   Cardiology Palliative care consult.  Antimicrobials:   Anti-infectives (From admission, onward)    None        Medications  Scheduled Meds:  apixaban  5 mg Oral BID   benzonatate  100 mg Oral TID   cholecalciferol  1,000 Units Oral Daily   feeding supplement  237 mL Oral BID BM   ferrous sulfate  325 mg Oral QHS   finasteride  5 mg Oral Daily   insulin aspart  0-5 Units Subcutaneous QHS   insulin aspart  0-9 Units Subcutaneous TID WC   insulin aspart  2 Units Subcutaneous TID WC   insulin glargine-yfgn  6 Units Subcutaneous Daily   levothyroxine  25 mcg Oral Q0600   magnesium oxide  200 mg Oral Daily   metoprolol succinate  25 mg Oral QHS   spironolactone  12.5 mg Oral Daily   Tafamidis  1 capsule Oral Daily   vitamin B-12  50 mcg Oral Daily   Continuous Infusions:   PRN Meds:.acetaminophen **OR** acetaminophen, albuterol, bisacodyl, phenol, polyethylene glycol    Subjective:   Earl [name] was seen and examined today.   Alert , oriented today. No new complaints.  Objective:   Vitals:   02/24/24 0318 02/24/24 0804 02/24/24 1251 02/24/24 1547  BP: 117/63 116/64 118/64 (!) 116/56  Pulse: 62 62 61   Resp: 18 15 20 20   Temp: 98.2 F (36.8 C) 98.8 F (37.1 C) 98 F (36.7 C)  98.6 F (37 C)  TempSrc: Oral Oral Oral Oral  SpO2: 100% 98% 100% 98%  Weight:        Intake/Output Summary (Last 24 hours) at 02/24/2024 1839 Last data filed at 02/24/2024 1548 Gross per 24 hour  Intake 2368.8 ml  Output 700 ml  Net 1668.8 ml   Filed Weights   02/14/24 1952  Weight: 72.1 kg     Exam General exam: elderly gentleman, not in distress.  Respiratory system: Clear to auscultation. Respiratory effort normal. Cardiovascular system: S1 &  S2 heard, RRR. No JVD,  Gastrointestinal system: Abdomen is nondistended, soft and nontender.  Central nervous system: Alert and oriented. Extremities: Symmetric 5 x 5 power. Skin: No rashes,  Psychiatry:  Mood & affect appropriate.       Data Reviewed:  I have personally reviewed following labs and imaging studies   CBC Lab Results  Component Value Date   WBC 3.9 (L) 02/23/2024   RBC 3.98 (L) 02/23/2024   HGB 11.7 (L) 02/23/2024   HCT 34.0 (L) 02/23/2024   MCV 85.4 02/23/2024   MCH 29.4 02/23/2024   PLT 169 02/23/2024   MCHC 34.4 02/23/2024   RDW 13.6 02/23/2024   LYMPHSABS 0.7 02/23/2024   MONOABS 0.4 02/23/2024   EOSABS 0.0 02/23/2024   BASOSABS 0.0 02/23/2024     Last metabolic panel Lab Results  Component Value Date   NA 138 02/24/2024   K 4.1 02/24/2024   CL 109 02/24/2024   CO2 20 (L) 02/24/2024   BUN 21 02/24/2024   CREATININE 1.18 02/24/2024   GLUCOSE 215 (H) 02/24/2024   GFRNONAA >60 02/24/2024   GFRAA 77 05/17/2021   CALCIUM 7.6 (L) 02/24/2024   PHOS 2.8 02/20/2024   PROT 4.6 (L) 02/23/2024   ALBUMIN 1.8 (L) 02/23/2024   LABGLOB 2.4 12/22/2021   BILITOT 0.4 02/23/2024   ALKPHOS 46 02/23/2024   AST 54 (H) 02/23/2024   ALT 39 02/23/2024   ANIONGAP 9 02/24/2024    CBG (last 3)  Recent Labs    02/24/24 0610 02/24/24 1223 02/24/24 1551  GLUCAP 131* 315* 264*      Coagulation Profile: No results for input(s): "INR", "PROTIME" in the last 168 hours.   Radiology Studies: No results  found.      Kathlen Mody M.D. [name] 02/24/2024, 6:39 PM  Available via Epic secure chat 7am-7pm After 7 pm, please refer to night coverage provider listed on amion.

## 2024-02-24 NOTE — Progress Notes (Signed)
 Rounding Note    Patient Name: Earl [name] Date of Encounter: 02/24/2024  [facility] Cardiologist: Sherryl Manges, MD   Subjective   PT denies SOB or CP   Inpatient Medications    Scheduled Meds:  apixaban  5 mg Oral BID   benzonatate  100 mg Oral TID   cholecalciferol  1,000 Units Oral Daily   feeding supplement  237 mL Oral BID BM   ferrous sulfate  325 mg Oral QHS   finasteride  5 mg Oral Daily   insulin aspart  0-5 Units Subcutaneous QHS   insulin aspart  0-9 Units Subcutaneous TID WC   insulin glargine-yfgn  6 Units Subcutaneous Daily   levothyroxine  25 mcg Oral Q0600   magnesium oxide  200 mg Oral Daily   metoprolol succinate  25 mg Oral QHS   spironolactone  12.5 mg Oral Daily   Tafamidis  1 capsule Oral Daily   vitamin B-12  50 mcg Oral Daily   Continuous Infusions:  sodium chloride 75 mL/hr at 02/24/24 0553   PRN Meds: acetaminophen **OR** acetaminophen, albuterol, bisacodyl, phenol, polyethylene glycol   Vital Signs    Vitals:   02/24/24 0015 02/24/24 0318 02/24/24 0804 02/24/24 1251  BP: 122/65 117/63 116/64 118/64  Pulse: 61 62 62 61  Resp: 18 18 15 20   Temp: 97.8 F (36.6 C) 98.2 F (36.8 C) 98.8 F (37.1 C) 98 F (36.7 C)  TempSrc: Oral Oral Oral Oral  SpO2: 100% 100% 98% 100%  Weight:        Intake/Output Summary (Last 24 hours) at 02/24/2024 1429 Last data filed at 02/24/2024 0800 Gross per 24 hour  Intake 1857.59 ml  Output 650 ml  Net 1207.59 ml      02/14/2024    7:52 PM 01/24/2024   11:05 AM 01/17/2024    2:26 PM  Last 3 Weights  Weight (lbs) 159 lb 159 lb 159 lb  Weight (kg) 72.122 kg 72.122 kg 72.122 kg      Telemetry    No tele - Personally Reviewed  ECG     - Personally Reviewed  Physical Exam   GEN: No acute distress. Laying in bed   Neck: No JVD Cardiac: RRR, no murmurs, Respiratory: Clear to auscultation bilaterally. GI: Soft, nontender, non-distended  MS: No edema;  Labs    High  Sensitivity Troponin:  No results for input(s): "TROPONINIHS" in the last 720 hours.   Chemistry Recent Labs  Lab 02/18/24 [phone] 02/18/24 1142 02/20/24 0539 02/21/24 0524 02/22/24 1047 02/23/24 0613 02/24/24 0948  NA 136   < > 139   < > 137 140 138  K 3.8   < > 4.2   < > 4.4 4.2 4.1  CL 104   < > 107   < > 108 110 109  CO2 22   < > 22   < > 22 21* 20*  GLUCOSE 176*   < > 121*   < > 272* 118* 215*  BUN 14   < > 21   < > 28* 25* 21  CREATININE 0.87   < > 0.93   < > 1.23 1.07 1.18  CALCIUM 7.9*   < > 8.0*   < > 7.7* 7.7* 7.6*  MG 1.9  --  1.9  --   --   --   --   PROT  --   --   --   --   --  4.6*  --  ALBUMIN  --   --  2.5*  --   --  1.8*  --   AST  --   --   --   --   --  54*  --   ALT  --   --   --   --   --  39  --   ALKPHOS  --   --   --   --   --  46  --   BILITOT  --   --   --   --   --  0.4  --   GFRNONAA >60   < > >60   < > 59* >60 >60  ANIONGAP 10   < > 10   < > 7 9 9    < > = values in this interval not displayed.    Lipids No results for input(s): "CHOL", "TRIG", "HDL", "LABVLDL", "LDLCALC", "CHOLHDL" in the last 168 hours.  Hematology Recent Labs  Lab 02/20/24 0539 02/21/24 0524 02/23/24 0613  WBC 3.2* 3.1* 3.9*  RBC 3.98* 4.14* 3.98*  HGB 11.8* 12.2* 11.7*  HCT 34.0* 35.4* 34.0*  MCV 85.4 85.5 85.4  MCH 29.6 29.5 29.4  MCHC 34.7 34.5 34.4  RDW 13.7 13.6 13.6  PLT 130* 147* 169   Thyroid  Recent Labs  Lab 02/22/24 1047  TSH 0.863    BNPNo results for input(s): "BNP", "PROBNP" in the last 168 hours.  DDimer No results for input(s): "DDIMER" in the last 168 hours.   Radiology    CT HEAD WO CONTRAST ( ) Result Date: 02/22/2024 CLINICAL DATA:  Altered mental status EXAM: CT HEAD WITHOUT CONTRAST TECHNIQUE: Contiguous axial images were obtained from the base of the skull through the vertex without intravenous contrast. RADIATION DOSE REDUCTION: This exam was performed according to the departmental dose-optimization program which includes automated  exposure control, adjustment of the mA and/or kV according to patient size and/or use of iterative reconstruction technique. COMPARISON:  02/14/2024 FINDINGS: Brain: Of old right cerebellar infarct. Vascular: No hyperdense vessel or unexpected vascular calcification. Skull: The visualized skull base, calvarium and extracranial soft tissues are normal. Sinuses/Orbits: No fluid levels or advanced mucosal thickening of the visualized paranasal sinuses. No mastoid or middle ear effusion. Normal orbits. Other: None. IMPRESSION: 1. No acute intracranial abnormality. 2. Old right cerebellar infarct. Electronically Signed   By: Deatra Robinson M.D.   On: 02/22/2024 19:09    Cardiac Studies   Echo Oct 2024   1. Left ventricular ejection fraction, by estimation, is 45 to 50%. The  left ventricle has mildly decreased function. The left ventricle  demonstrates global hypokinesis. There is moderate concentric left  ventricular hypertrophy. Left ventricular  diastolic parameters are consistent with Grade II diastolic dysfunction  (pseudonormalization). The average left ventricular global longitudinal  strain is -12.7 %. The global longitudinal strain is abnormal.   2. Right ventricular systolic function is normal. The right ventricular  size is mildly enlarged. There is mildly elevated pulmonary artery  systolic pressure. The estimated right ventricular systolic pressure is  38.0 mmHg.   3. Left atrial size was mild to moderately dilated.   4. Right atrial size was mildly dilated.   5. The mitral valve is normal in structure. Mild mitral valve  regurgitation. No evidence of mitral stenosis.   6. The aortic valve is tricuspid. Aortic valve regurgitation is mild. No  aortic stenosis is present.   7. The inferior vena cava is normal in size with greater than  50%  respiratory variability, suggesting right atrial pressure of 3 mmHg.    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 Hospital course c/b low BP and syncope. AHF team consulted to assist w/ management of HF regimen.     Assessment & Plan    1  Hypotension  in setting of recent COVID infection and ATTR amyloid  Pt admitted with dizziness, orthostasis    Currently Entresto, diuretics on hold   B Blocker dose reduced to 25 mg daily    Cleda Daub restarted at 12.5 daily  On tafamidis and amvutrra Would get orthostatic BP /P today   Further recommendations based on results   2  HFmrEF  LVEF  in Oct 2024 45 to 50%  Normal RV function  Global LGS -12.7% Volume status is not elevated        For questions or updates, please contact [facility] Please consult [email or web address] for contact info under        Signed, Dietrich Pates, MD  02/24/2024, 2:29 PM

## 2024-02-24 NOTE — Progress Notes (Signed)
 Daily Progress Note   Patient Name: Earl [name]       Date: 02/24/2024 DOB: 08/24/1941  Age: 83 y.o. MRN#: [id number] Attending Physician: Kathlen Mody, MD Primary Care Physician: [name], MD Admit Date: 02/14/2024  Reason for Consultation/Follow-up: Establishing goals of care  Subjective: I have reviewed medical records including EPIC notes, MAR, any available advanced directives as necessary, and labs. Received report from primary RN - no acute concerns.  RN reports patient is more alert today and eating/drinking.  Went to visit patient at bedside - no family/visitors present.  Patient was lying in bed awake, alert, oriented, and able to participate in conversation. No signs or non-verbal gestures of pain or discomfort noted. No respiratory distress, increased work of breathing, or secretions noted.  He reports "feeling okay."  Emotional support provided to patient. Patient has a moderate understanding of his current acute medical situation.  Reviewed interval history since hospital admission.  Goals of care discussed -short-term patient is motivated to work with PT/OT and hopes to regain strength so long-term he can return home.  He is open for discharge to rehab.  Encouraged patient to consider DNR/DNI status understanding evidenced based poor outcomes in similar hospitalized patient, as the cause of arrest is likely associated with advanced chronic/terminal illness rather than an easily reversible acute cardio-pulmonary event.  I shared that even if we pursued resuscitation we would not able to resolve the underlying factors. I explained that DNR/DNI does not change the medical plan and it only comes into effect after a person has arrested (died).  It is a protective measure to  keep [place] from harming the patient in their last moments of life.  Patient indicates that he does prefer DNR/DNI over aggressive resuscitative attempts; however, indicates that he does not want to make final decisions without his daughter.  11:54 AM Called daughter/Para - she is unable to talk at this time and requested phone call later this afternoon.  3:53 PM Conference called daughter/Para and son/Brent -emotional support provided.  Reviewed conversation with patient as outlined above minus code status discussion (will discuss this tomorrow).   Anticipatory care planning discussed in lengthy detail. Discussed that the goal of rehab is improvement/stabalization of functional status,  which can be a difficult goal to meet for patients with advanced illness and multiple medical conditions.  Reviewed what is needed for someone to have a positive rehabilitation experience to include adequate nutritional intake as well as willingness/ability to participate.  Expressed concern over patient's malnutrition and how this may limit his rehabilitation progress.  Pending patient's course at rehab facility, consideration of quality of life, and goals around rehospitalization, discussed possible options of him returning home with continued aide services vs ALF/LTC placement vs home hospice/hospice facility.  Outpatient palliative care reviewed and offered - family accepting and appreciative.  All questions and concerns addressed. Encouraged to call with questions and/or concerns. PMT number provided.  Length of Stay: 8  Current Medications: Scheduled Meds:   apixaban  5 mg Oral BID   benzonatate  100 mg Oral TID   cholecalciferol  1,000 Units Oral Daily   feeding supplement  237 mL Oral BID BM   ferrous sulfate  325 mg Oral QHS   finasteride  5 mg Oral Daily   insulin aspart  0-5 Units Subcutaneous QHS   insulin aspart  0-9 Units Subcutaneous TID WC   insulin glargine-yfgn  6 Units Subcutaneous Daily    levothyroxine  25 mcg Oral Q0600   magnesium oxide  200 mg Oral Daily   metoprolol succinate  25 mg Oral QHS   spironolactone  12.5 mg Oral Daily   Tafamidis  1 capsule Oral Daily   vitamin B-12  50 mcg Oral Daily    Continuous Infusions:  sodium chloride 75 mL/hr at 02/24/24 0553    PRN Meds: acetaminophen **OR** acetaminophen, albuterol, bisacodyl, phenol, polyethylene glycol  Physical Exam Vitals and nursing note reviewed.  Constitutional:      General: He is not in acute distress. Pulmonary:     Effort: No respiratory distress.  Skin:    General: Skin is warm and dry.  Neurological:     Mental Status: He is alert and oriented to person, place, and time.     Motor: Weakness present.  Psychiatric:        Attention and Perception: Attention normal.        Behavior: Behavior is cooperative.        Cognition and Memory: Cognition and memory normal.             Vital Signs: BP 116/64 (BP Location: Left Arm)   Pulse 62   Temp 98.8 F (37.1 C) (Oral)   Resp 15   Wt 72.1 kg   SpO2 98%   BMI 22.18 kg/m  SpO2: SpO2: 98 % O2 Device: O2 Device: Room Air O2 Flow Rate:    Intake/output summary:  Intake/Output Summary (Last 24 hours) at 02/24/2024 1100 Last data filed at 02/24/2024 0800 Gross per 24 hour  Intake 2322.64 ml  Output 650 ml  Net 1672.64 ml   LBM: Last BM Date : 02/14/24 Baseline Weight: Weight: 72.1 kg Most recent weight: Weight: 72.1 kg       Palliative Assessment/Data: PPS 50%      Patient Active Problem List   Diagnosis Date Noted   Syncope, vasovagal 02/16/2024   COVID-19 virus infection 02/15/2024   Acute metabolic encephalopathy 11/14/2023   Failure to thrive in adult 11/14/2023   Generalized weakness 11/14/2023   Fall at home, initial encounter 11/14/2023   Muscle spasms of both lower extremities 11/14/2023   Pressure ulcer 11/14/2023   Chronic systolic CHF (congestive heart failure) (HCC) 09/11/2022   NICM (nonischemic cardiomyopathy)  (HCC) 11/02/2021   Acute on chronic congestive heart failure (HCC) 07/24/2021   Iron deficiency anemia  07/09/2021   Diabetic 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 (HCC) 05/17/2021   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   Acquired thrombophilia (HCC) 09/09/2019   Abdominal aortic atherosclerosis (HCC) by Abd CTscvn  2016 09/09/2019   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   Chronic anticoagulation 09/04/2017   Ataxia, post-stroke    History of CVA (cerebrovascular accident)    Hypothyroidism 09/13/2016   Vitamin D deficiency 04/29/2014   Non-insulin dependent type 2 diabetes mellitus (HCC)    BPH (benign prostatic hyperplasia)    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   Chronic anemia 11/07/2013   Essential hypertension 11/07/2013   Hyperlipidemia 11/07/2013   Paroxysmal atrial fibrillation (HCC) 09/18/2012    Palliative Care Assessment & Plan   Patient Profile: 83 y.o. 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    Upon workup in the ED, patient was positive for COVID and rhabdomyolysis with elevated CK.  He was admitted for supportive care.  Hospitalization complicated by episode of vasovagal syncope.  Heart failure team was consulted. PMT has been consulted day 7 of hospitalization to assist with goals of care conversation.  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  mellitus (HCC)   NICM (nonischemic cardiomyopathy) (HCC)   Chronic systolic CHF (congestive heart failure) (HCC)   Syncope, vasovagal   Recommendations/Plan: Continue full code/full scope care Goals are for discharge to SNF rehab with outpatient Palliative Care to follow [facility] consulted for:  family's request for ALF facility options Will continue code status discussions tomorrow 4/6 PMT will continue to follow and support holistically  Goals of Care and Additional Recommendations: Limitations on Scope of Treatment: Full Scope Treatment  Code Status:    Code Status Orders  (From admission, onward)           Start     Ordered   02/15/24 0814  Full code  Continuous       Question:  By:  Answer:  Consent: discussion documented in EHR   02/15/24 0813           Code Status History     Date Active Date Inactive Code Status Order ID Comments User Context   11/14/2023 2326 11/16/2023 2302 Full Code [id number]  Tereasa Coop, MD ED   10/07/2021 1136 10/07/2021 1942 Full Code [id number]  Laurey Morale, MD Inpatient   07/28/2017 1834 08/08/2017 1437 Full Code [id number]  Jerene Pitch Inpatient   07/28/2017 1834 07/28/2017 1834 Full Code [id number]  Jacquelynn Cree, PA-C Inpatient   07/26/2017 0109 07/28/2017 1828 Full Code [id number]  Onnie Boer, MD Inpatient       Prognosis:  Unable to determine  Discharge Planning: Skilled Nursing Facility for rehab with Palliative care service follow-up  Care plan was discussed with primary RN, patient's family, patient  Thank you for allowing the Palliative Medicine Team to assist in the care of this patient.   Total Time 65 minutes Prolonged Time Billed  yes       Haskel Khan, NP  Please contact Palliative Medicine Team phone at [phone] for questions and concerns.   *Portions of this note are a verbal dictation therefore any spelling and/or grammatical errors are due to the "[employer]" system  interpretation.

## 2024-02-25 ENCOUNTER — Telehealth: Payer: Self-pay | Admitting: Physician Assistant

## 2024-02-25 DIAGNOSIS — Z515 Encounter for palliative care: Secondary | ICD-10-CM | POA: Diagnosis not present

## 2024-02-25 DIAGNOSIS — U071 COVID-19: Secondary | ICD-10-CM | POA: Diagnosis not present

## 2024-02-25 DIAGNOSIS — W19XXXA Unspecified fall, initial encounter: Secondary | ICD-10-CM | POA: Diagnosis not present

## 2024-02-25 DIAGNOSIS — I480 Paroxysmal atrial fibrillation: Secondary | ICD-10-CM | POA: Diagnosis not present

## 2024-02-25 DIAGNOSIS — Z7901 Long term (current) use of anticoagulants: Secondary | ICD-10-CM | POA: Diagnosis not present

## 2024-02-25 DIAGNOSIS — E039 Hypothyroidism, unspecified: Secondary | ICD-10-CM | POA: Diagnosis not present

## 2024-02-25 DIAGNOSIS — R531 Weakness: Secondary | ICD-10-CM | POA: Diagnosis not present

## 2024-02-25 DIAGNOSIS — Z66 Do not resuscitate: Secondary | ICD-10-CM

## 2024-02-25 DIAGNOSIS — I5042 Chronic combined systolic (congestive) and diastolic (congestive) heart failure: Secondary | ICD-10-CM | POA: Diagnosis not present

## 2024-02-25 LAB — GLUCOSE, CAPILLARY
Glucose-Capillary: 128 mg/dL — ABNORMAL HIGH (ref 70–99)
Glucose-Capillary: 139 mg/dL — ABNORMAL HIGH (ref 70–99)
Glucose-Capillary: 255 mg/dL — ABNORMAL HIGH (ref 70–99)
Glucose-Capillary: 86 mg/dL (ref 70–99)
Glucose-Capillary: 92 mg/dL (ref 70–99)

## 2024-02-25 LAB — CK: Total CK: 1152 U/L — ABNORMAL HIGH (ref 49–397)

## 2024-02-25 MED ORDER — SENNOSIDES-DOCUSATE SODIUM 8.6-50 MG PO TABS
2.0000 | ORAL_TABLET | Freq: Two times a day (BID) | ORAL | Status: DC
  Administered 2024-02-25 – 2024-02-29 (×8): 2 via ORAL
  Filled 2024-02-25 (×8): qty 2

## 2024-02-25 MED ORDER — IBUPROFEN 200 MG PO TABS
200.0000 mg | ORAL_TABLET | Freq: Once | ORAL | Status: AC | PRN
  Administered 2024-02-25: 200 mg via ORAL
  Filled 2024-02-25: qty 1

## 2024-02-25 NOTE — Plan of Care (Signed)

## 2024-02-25 NOTE — Telephone Encounter (Signed)
 Patient followed by Dr. Shirlee Latch, Dr. Tenny Craw requests 2 week post-hospital follow-up, history of amyloid. I wasn't sure who to message for [facility], can you help and call patient with appt info? Thank you!

## 2024-02-25 NOTE — Consult Note (Signed)
 Please note that the [facility] nursing team is utilizing a standardized work plan to manage patient consults. We are triaging consults and will try to see the patients within 48 hours. Wound photos in the patient's chart allow [place] to consult on the patient in the most efficient and timely manner.    Guneet Delpino [facility], CNS, [employer] [phone]

## 2024-02-25 NOTE — Progress Notes (Signed)
 Daily Progress Note   Patient Name: Earl [name]       Date: 02/25/2024 DOB: 12/15/1940  Age: 83 y.o. MRN#: [id number] Attending Physician: Kathlen Mody, MD Primary Care Physician: [name], MD Admit Date: 02/14/2024  Reason for Consultation/Follow-up: {Reason for Consult:23484}  Subjective: I have reviewed medical records including EPIC notes, MAR, any available advanced directives as necessary, and labs. Received report from primary RN - ***  All questions and concerns addressed. Encouraged to call with questions and/or concerns. PMT card provided.  Length of Stay: 9  Current Medications: Scheduled Meds:   apixaban  5 mg Oral BID   benzonatate  100 mg Oral TID   cholecalciferol  1,000 Units Oral Daily   feeding supplement  237 mL Oral BID BM   ferrous sulfate  325 mg Oral QHS   finasteride  5 mg Oral Daily   insulin aspart  0-5 Units Subcutaneous QHS   insulin aspart  0-9 Units Subcutaneous TID WC   insulin aspart  4 Units Subcutaneous TID WC   insulin glargine-yfgn  7 Units Subcutaneous Daily   levothyroxine  25 mcg Oral Q0600   magnesium oxide  200 mg Oral Daily   metoprolol succinate  25 mg Oral QHS   senna-docusate  2 tablet Oral BID   Tafamidis  1 capsule Oral Daily   vitamin B-12  50 mcg Oral Daily    Continuous Infusions:   PRN Meds: acetaminophen **OR** acetaminophen, albuterol, bisacodyl, phenol, polyethylene glycol  Physical Exam          Vital Signs: BP (!) 116/59 (BP Location: Left Arm)   Pulse 63   Temp 97.9 F (36.6 C) (Oral)   Resp 20   Ht 5\' 11"  (1.803 m)   Wt 72.1 kg   SpO2 100%   BMI 22.18 kg/m  SpO2: SpO2: 100 % O2 Device: O2 Device: Room Air O2 Flow Rate:    Intake/output summary:  Intake/Output Summary (Last 24 hours) at  02/25/2024 1652 Last data filed at 02/25/2024 1100 Gross per 24 hour  Intake --  Output 1100 ml  Net -1100 ml   LBM: Last BM Date : 02/19/24 Baseline Weight: Weight: 72.1 kg Most recent weight: Weight: 72.1 kg       Palliative Assessment/Data:      Patient Active Problem List  Diagnosis Date Noted   Syncope, vasovagal 02/16/2024   COVID-19 virus infection 02/15/2024   Acute metabolic encephalopathy 11/14/2023   Failure to thrive in adult 11/14/2023   Generalized weakness 11/14/2023   Fall at home, initial encounter 11/14/2023   Muscle spasms of both lower extremities 11/14/2023   Pressure ulcer 11/14/2023   Chronic systolic CHF (congestive heart failure) (HCC) 09/11/2022   NICM (nonischemic cardiomyopathy) (HCC) 11/02/2021   Acute on chronic congestive heart failure (HCC) 07/24/2021   Iron deficiency anemia 07/09/2021   Diabetic 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 (HCC) 05/17/2021   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   Acquired thrombophilia (HCC) 09/09/2019   Abdominal aortic atherosclerosis (HCC) by Abd CTscvn  2016 09/09/2019   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   Chronic anticoagulation 09/04/2017   Ataxia, post-stroke    History of CVA (cerebrovascular accident)    Hypothyroidism 09/13/2016   Vitamin D deficiency 04/29/2014   Non-insulin dependent type 2 diabetes mellitus (HCC)    BPH (benign prostatic hyperplasia)    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   Chronic anemia 11/07/2013   Essential hypertension 11/07/2013   Hyperlipidemia 11/07/2013   Paroxysmal atrial fibrillation (HCC) 09/18/2012    Palliative Care Assessment & Plan   Patient Profile: ***  Assessment: Principal Problem:   COVID-19 virus infection Active Problems:   Paroxysmal atrial fibrillation (HCC)   Essential hypertension   Hyperlipidemia   Non-insulin dependent type 2 diabetes mellitus (HCC)   BPH (benign  prostatic hyperplasia)   Hypothyroidism   History of CVA (cerebrovascular accident)   Chronic anticoagulation   Diabetic 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 (HCC)   NICM (nonischemic cardiomyopathy) (HCC)   Chronic systolic CHF (congestive heart failure) (HCC)   Syncope, vasovagal   Recommendations/Plan: ***  Goals of Care and Additional Recommendations: Limitations on Scope of Treatment: {Recommended Scope and Preferences:21019}  Code Status:    Code Status Orders  (From admission, onward)           Start     Ordered   02/15/24 0814  Full code  Continuous       Question:  By:  Answer:  Consent: discussion documented in EHR   02/15/24 0813           Code Status History     Date Active Date Inactive Code Status Order ID Comments User Context   11/14/2023 2326 11/16/2023 2302 Full Code [id number]  Tereasa Coop, MD ED   10/07/2021 1136 10/07/2021 1942 Full Code [id number]  Laurey Morale, MD Inpatient   07/28/2017 1834 08/08/2017 1437 Full Code [id number]  Jerene Pitch Inpatient   07/28/2017 1834 07/28/2017 1834 Full Code [id number]  Jacquelynn Cree, PA-C Inpatient   07/26/2017 0109 07/28/2017 1828 Full Code [id number]  Onnie Boer, MD Inpatient       Prognosis:  {Palliative Care Prognosis:23504}  Discharge Planning: {Palliative dispostion:23505}  Care plan was discussed with ***  Thank you for allowing the Palliative Medicine Team to assist in the care of this patient.   Time In: *** Time Out: *** Total Time *** Prolonged Time Billed  {YES JJ:88416}       Haskel Khan, NP  Please contact Palliative Medicine Team phone at [phone] for questions and concerns.   *Portions of this note are a verbal dictation therefore any spelling and/or grammatical errors are due to the "Dragon Medical One" system interpretation.

## 2024-02-25 NOTE — Progress Notes (Signed)
 Rounding Note    Patient Name: Earl [name] Date of Encounter: 02/25/2024  [facility] Cardiologist: Sherryl Manges, MD   Subjective   Pt in bed  No dizziness  Inpatient Medications    Scheduled Meds:  apixaban  5 mg Oral BID   benzonatate  100 mg Oral TID   cholecalciferol  1,000 Units Oral Daily   feeding supplement  237 mL Oral BID BM   ferrous sulfate  325 mg Oral QHS   finasteride  5 mg Oral Daily   insulin aspart  0-5 Units Subcutaneous QHS   insulin aspart  0-9 Units Subcutaneous TID WC   insulin aspart  4 Units Subcutaneous TID WC   insulin glargine-yfgn  7 Units Subcutaneous Daily   levothyroxine  25 mcg Oral Q0600   magnesium oxide  200 mg Oral Daily   metoprolol succinate  25 mg Oral QHS   spironolactone  12.5 mg Oral Daily   Tafamidis  1 capsule Oral Daily   vitamin B-12  50 mcg Oral Daily   Continuous Infusions:   PRN Meds: acetaminophen **OR** acetaminophen, albuterol, bisacodyl, phenol, polyethylene glycol   Vital Signs    Vitals:   02/24/24 1547 02/24/24 2028 02/25/24 0000 02/25/24 0427  BP: (!) 116/56 116/63 138/69 124/71  Pulse:  61 62 71  Resp: 20 16  18   Temp: 98.6 F (37 C) 97.6 F (36.4 C) (!) 97.5 F (36.4 C)   TempSrc: Oral Oral Oral   SpO2: 98% 100% 100% 100%  Weight:         Intake/Output Summary (Last 24 hours) at 02/25/2024 1610 Last data filed at 02/24/2024 2130 Gross per 24 hour  Intake 1061.28 ml  Output 1300 ml  Net -238.72 ml      02/14/2024    7:52 PM 01/24/2024   11:05 AM 01/17/2024    2:26 PM  Last 3 Weights  Weight (lbs) 159 lb 159 lb 159 lb  Weight (kg) 72.122 kg 72.122 kg 72.122 kg      Telemetry    =SR  - Personally Reviewed  ECG     - Personally Reviewed  Physical Exam   GEN: No acute distress. Laying in bed   Neck: No JVD Cardiac: RRR, no murmur Respiratory: Clear to auscultation bilaterally. GI: Soft, nontender, non-distended  MS: No LE edema;  Labs    High Sensitivity  Troponin:  No results for input(s): "TROPONINIHS" in the last 720 hours.   Chemistry Recent Labs  Lab 02/20/24 0539 02/21/24 0524 02/22/24 1047 02/23/24 0613 02/24/24 0948  NA 139   < > 137 140 138  K 4.2   < > 4.4 4.2 4.1  CL 107   < > 108 110 109  CO2 22   < > 22 21* 20*  GLUCOSE 121*   < > 272* 118* 215*  BUN 21   < > 28* 25* 21  CREATININE 0.93   < > 1.23 1.07 1.18  CALCIUM 8.0*   < > 7.7* 7.7* 7.6*  MG 1.9  --   --   --   --   PROT  --   --   --  4.6*  --   ALBUMIN 2.5*  --   --  1.8*  --   AST  --   --   --  54*  --   ALT  --   --   --  39  --   ALKPHOS  --   --   --  46  --   BILITOT  --   --   --  0.4  --   GFRNONAA >60   < > 59* >60 >60  ANIONGAP 10   < > 7 9 9    < > = values in this interval not displayed.    Lipids No results for input(s): "CHOL", "TRIG", "HDL", "LABVLDL", "LDLCALC", "CHOLHDL" in the last 168 hours.  Hematology Recent Labs  Lab 02/20/24 0539 02/21/24 0524 02/23/24 0613  WBC 3.2* 3.1* 3.9*  RBC 3.98* 4.14* 3.98*  HGB 11.8* 12.2* 11.7*  HCT 34.0* 35.4* 34.0*  MCV 85.4 85.5 85.4  MCH 29.6 29.5 29.4  MCHC 34.7 34.5 34.4  RDW 13.7 13.6 13.6  PLT 130* 147* 169   Thyroid  Recent Labs  Lab 02/22/24 1047  TSH 0.863    BNPNo results for input(s): "BNP", "PROBNP" in the last 168 hours.  DDimer No results for input(s): "DDIMER" in the last 168 hours.   Radiology    No results found.   Cardiac Studies   Echo Oct 2024   1. Left ventricular ejection fraction, by estimation, is 45 to 50%. The  left ventricle has mildly decreased function. The left ventricle  demonstrates global hypokinesis. There is moderate concentric left  ventricular hypertrophy. Left ventricular  diastolic parameters are consistent with Grade II diastolic dysfunction  (pseudonormalization). The average left ventricular global longitudinal  strain is -12.7 %. The global longitudinal strain is abnormal.   2. Right ventricular systolic function is normal. The right  ventricular  size is mildly enlarged. There is mildly elevated pulmonary artery  systolic pressure. The estimated right ventricular systolic pressure is  38.0 mmHg.   3. Left atrial size was mild to moderately dilated.   4. Right atrial size was mildly dilated.   5. The mitral valve is normal in structure. Mild mitral valve  regurgitation. No evidence of mitral stenosis.   6. The aortic valve is tricuspid. Aortic valve regurgitation is mild. No  aortic stenosis is present.   7. The inferior vena cava is normal in size with greater than 50%  respiratory variability, suggesting right atrial pressure of 3 mmHg.    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 Hospital course c/b low BP and syncope. AHF team consulted to assist w/ management of HF regimen.     Assessment & Plan    1  Hypotension  In  setting of recent COVID infection and ATTR amyloid  Pt admitted with dizziness, orthostasis     ORthostatics yesterday as noted above   HR increased from 58 to 92 bpm   BP maintained   Evidence of POTS I would hold spironolactone and diuretics which will go against maintaining intravascular volume  Keep medicines as is   It appears that he is not getting Entresto   Agree with this.   Continue meds for amyloid   Abdominal binder when in the upright position. Elevate HOB when in bed    2  HFmrEF  LVEF  in Oct 2024 45 to 50%  Normal RV function  Global LGS -12.7% Volume status is not elevated Will need to be followed    Will sign off   Will arrange for appt as outpt  For questions or updates, please contact [facility] Please consult [email or web address] for contact info under        Signed, Dietrich Pates, MD  02/25/2024, 8:22 AM

## 2024-02-26 ENCOUNTER — Other Ambulatory Visit: Payer: Self-pay

## 2024-02-26 ENCOUNTER — Telehealth: Payer: Self-pay

## 2024-02-26 ENCOUNTER — Ambulatory Visit: Payer: Medicare Other | Admitting: Neurology

## 2024-02-26 DIAGNOSIS — I480 Paroxysmal atrial fibrillation: Secondary | ICD-10-CM | POA: Diagnosis not present

## 2024-02-26 DIAGNOSIS — U071 COVID-19: Secondary | ICD-10-CM | POA: Diagnosis not present

## 2024-02-26 DIAGNOSIS — E039 Hypothyroidism, unspecified: Secondary | ICD-10-CM | POA: Diagnosis not present

## 2024-02-26 DIAGNOSIS — Z7901 Long term (current) use of anticoagulants: Secondary | ICD-10-CM | POA: Diagnosis not present

## 2024-02-26 LAB — GLUCOSE, CAPILLARY
Glucose-Capillary: 119 mg/dL — ABNORMAL HIGH (ref 70–99)
Glucose-Capillary: 353 mg/dL — ABNORMAL HIGH (ref 70–99)
Glucose-Capillary: 242 mg/dL — ABNORMAL HIGH (ref 70–99)
Glucose-Capillary: 235 mg/dL — ABNORMAL HIGH (ref 70–99)
Glucose-Capillary: 151 mg/dL — ABNORMAL HIGH (ref 70–99)

## 2024-02-26 LAB — CULTURE, BLOOD (ROUTINE X 2)
Culture: NO GROWTH
Culture: NO GROWTH
Special Requests: ADEQUATE
Special Requests: ADEQUATE

## 2024-02-26 MED ORDER — MEDIHONEY WOUND/BURN DRESSING EX PSTE
1.0000 | PASTE | Freq: Every day | CUTANEOUS | Status: DC
  Administered 2024-02-26 – 2024-02-29 (×4): 1 via TOPICAL
  Filled 2024-02-26: qty 44

## 2024-02-26 MED ORDER — TRAMADOL HCL 50 MG PO TABS
50.0000 mg | ORAL_TABLET | Freq: Two times a day (BID) | ORAL | Status: DC | PRN
  Administered 2024-02-26 – 2024-02-29 (×5): 50 mg via ORAL
  Filled 2024-02-26 (×5): qty 1

## 2024-02-26 MED ORDER — METOPROLOL TARTRATE 5 MG/5ML IV SOLN: 2.5000 mg | INTRAVENOUS | Status: DC | PRN

## 2024-02-26 MED ORDER — SODIUM CHLORIDE 0.9 % IV SOLN: INTRAVENOUS | Status: DC

## 2024-02-26 NOTE — Progress Notes (Signed)
 [name]                                                                               Earl [name], is a 83 y.o. male, DOB - 05-29-1941, NWG:[id number] Admit date - 02/14/2024    Outpatient Primary MD for the patient is Earl Sheffield, MD  LOS - 10  days    Brief summary   83 year old gentleman with history of type 2 diabetes, hypertension, hyperlipidemia, CHF, A-fib on Eliquis, history of stroke with residual right-sided weakness, MGUS, GERD, BPH, hypothyroidism brought to the ED by EMS due to worsening generalized weakness.  Patient seen in the ED workup concerning for COVID-19 infection.  CT head MRI brain done negative for any acute abnormalities.   Unfortunately patient continues to be lethargic, with minimal interaction,.    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 ? From polyneuropathy from amyloidosis vs from COVID 19 infection.  On Baclofen which was stopped  on 4/3.  He is more alert and oriented.  Initial MRI of the brain is negative. Repeat CT head is negative for acute stroke.  Patient continuing to work with PT.  He appears to be in pain and frustrated overall.     COVID-19 infection Patient presented with generalized weakness, lethargy.  Patient denies having any respiratory symptoms COVID PCR positive, influenza A and B PCR are negative Chest x-ray is negative for pneumonia. Patient is on room air.    Hyperkalemia S/p Lokelma Resolved Patient was on Aldactone and Entresto which are on hold.   Rhabdomyolysis  CK levels worsened over the weekend. Fluids have been restarted.    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 Last echo showed LVEF of 45 to 50%. Left  ventricle demonstrates global hypokinesis. LVEF diastolic parameters show grade II diastolic dysfunction.  Torsemide, entresto are on hold for now in view of multiple episodes of syncope and hypotension.  Orthostatic vitals were done today. Drastic drop of SBP on standing.    Insulin dependent diabetes mellitus:  CBG (last 3)  Recent Labs    02/26/24 1222 02/26/24 1559 02/26/24 1816  GLUCAP 353* 242* 235*   Resume SSI. A1c IS 6.8%. increase to 7 units of Semglee and add 4 units of novolog TIDAC to SSI.    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 Last follow up with Dr Earl [name] in 2023.  Obtain MM panel.     Hyperlipidemia Resume statin.    BPH Continue with proscar.   Vasovagal syncope in the setting of autonomic neuropathy -Patient noted to have a vasovagal episode, 02/16/2024, while sitting on the bedside commode currently back to baseline. -MRI brain done on admission negative for any acute strokes. -2D echo done 08/2023 with EF of 45 to 50%, left ventricular global hypokinesis, grade 2 DD, no severe valvular abnormalities. -BP noted to be soft on 02/16/2024 and assessed patient hydrated gently with  IV fluids.   -BP improved.  -Patient noted to have another vasovagal episode of syncope while on the bedside commode having a large bowel movement per RN on 02/19/2024.   - BP parameters have improved and we continue to hold entresto, torsemide.  - positive orthostatic vital signs with PT today.  - holding aldactone.    PAF Continue with toprol for rate control. On Eliquis for anti coagulation.    History of stroke with residual right-sided weakness/hyperlipidemia -holding lipitor for elevated CK levels.  Continue with eliquis    Mild Pancytopenia ? From COVID INFECTION. Improving.   Fever:  Work up has been negative.     Hypothyroidism:  Resume Synthroid. TSH wnl.   Pressure injury over the right heel / back:  Pressure  Injury 02/26/24 Heel Right Deep Tissue Pressure Injury - Purple or maroon localized area of discolored intact skin or blood-filled blister due to damage of underlying soft tissue from pressure and/or shear. purple (Active)  02/26/24 1213  Location: Heel  Location Orientation: Right  Staging: Deep Tissue Pressure Injury - Purple or maroon localized area of discolored intact skin or blood-filled blister due to damage of underlying soft tissue from pressure and/or shear.  Wound Description (Comments): purple  Present on Admission:      Pressure Injury 02/26/24 Toe (Comment  which one) Anterior;Right Deep Tissue Pressure Injury - Purple or maroon localized area of discolored intact skin or blood-filled blister due to damage of underlying soft tissue from pressure and/or shear. right gre (Active)  02/26/24 1215  Location: Toe (Comment  which one)  Location Orientation: Anterior;Right  Staging: Deep Tissue Pressure Injury - Purple or maroon localized area of discolored intact skin or blood-filled blister due to damage of underlying soft tissue from pressure and/or shear.  Wound Description (Comments): right great toe  Present on Admission:   Wound care consulted and recommendations given.      In view of multiple medical issues, poor progression,requested palliative care for goals of care.  Plan for outpatient palliative care services to follow on discharge to SNF when stable.    Estimated body mass index is 22.18 kg/m as calculated from the following:   Height as of this encounter: 5\' 11"  (1.803 m).   Weight as of this encounter: 72.1 kg.  Code Status:  DNR /DNI.  DVT Prophylaxis:  Place TED hose Start: 02/22/24 1448 apixaban (ELIQUIS) tablet 5 mg   Level of Care: Level of care: Progressive Family Communication: discussed the plan with daughter and son over the phone.   Disposition Plan:     Remains inpatient appropriate:  pending clinical improvement.   Procedures:  MRI brain  without contrast.   Consultants:   Cardiology Palliative care consult.  Antimicrobials:   Anti-infectives (From admission, onward)    None        Medications  Scheduled Meds:  apixaban  5 mg Oral BID   benzonatate  100 mg Oral TID   cholecalciferol  1,000 Units Oral Daily   feeding supplement  237 mL Oral BID BM   ferrous sulfate  325 mg Oral QHS   finasteride  5 mg Oral Daily   insulin aspart  0-5 Units Subcutaneous QHS   insulin aspart  0-9 Units Subcutaneous TID WC   insulin aspart  4 Units Subcutaneous TID WC   insulin glargine-yfgn  7 Units Subcutaneous Daily   leptospermum manuka honey  1 Application Topical Daily   levothyroxine  25 mcg Oral Q0600  magnesium oxide  200 mg Oral Daily   metoprolol succinate  25 mg Oral QHS   senna-docusate  2 tablet Oral BID   Tafamidis  1 capsule Oral Daily   vitamin B-12  50 mcg Oral Daily   Continuous Infusions:  sodium chloride 75 mL/hr at 02/26/24 1314    PRN Meds:.acetaminophen **OR** acetaminophen, albuterol, bisacodyl, metoprolol tartrate, phenol, polyethylene glycol, traMADol    Subjective:   Earl [name] was seen and examined today.  No new events overnight.   Vitals:   02/26/24 0532 02/26/24 0904 02/26/24 1221 02/26/24 1624  BP:  113/82 122/73 125/73  Pulse:  76 86 77  Resp: 14 20 16 20   Temp:  98.2 F (36.8 C) 98.6 F (37 C) 97.7 F (36.5 C)  TempSrc:  Oral  Axillary  SpO2:  100% 100% 99%  Weight:      Height:        Intake/Output Summary (Last 24 hours) at 02/26/2024 1901 Last data filed at 02/26/2024 0950 Gross per 24 hour  Intake 263 ml  Output 1400 ml  Net -1137 ml   Filed Weights   02/14/24 1952  Weight: 72.1 kg     Exam General exam: Appears calm and comfortable  Respiratory system: Clear to auscultation. Respiratory effort normal. Cardiovascular system: S1 & S2 heard, RRR. No JVD, Gastrointestinal system: Abdomen is nondistended, soft and nontender.  Central nervous system:  Alert and oriented to person and place. Generalized weakness.  Extremities: Symmetric 5 x 5 power. Skin: No rashes, heel ulcer, unstageable ulcer.  Psychiatry: flat affect.        Data Reviewed:  I have personally reviewed following labs and imaging studies   CBC Lab Results  Component Value Date   WBC 3.9 (L) 02/23/2024   RBC 3.98 (L) 02/23/2024   HGB 11.7 (L) 02/23/2024   HCT 34.0 (L) 02/23/2024   MCV 85.4 02/23/2024   MCH 29.4 02/23/2024   PLT 169 02/23/2024   MCHC 34.4 02/23/2024   RDW 13.6 02/23/2024   LYMPHSABS 0.7 02/23/2024   MONOABS 0.4 02/23/2024   EOSABS 0.0 02/23/2024   BASOSABS 0.0 02/23/2024     Last metabolic panel Lab Results  Component Value Date   NA 138 02/24/2024   K 4.1 02/24/2024   CL 109 02/24/2024   CO2 20 (L) 02/24/2024   BUN 21 02/24/2024   CREATININE 1.18 02/24/2024   GLUCOSE 215 (H) 02/24/2024   GFRNONAA >60 02/24/2024   GFRAA 77 05/17/2021   CALCIUM 7.6 (L) 02/24/2024   PHOS 2.8 02/20/2024   PROT 4.6 (L) 02/23/2024   ALBUMIN 1.8 (L) 02/23/2024   LABGLOB 2.4 12/22/2021   BILITOT 0.4 02/23/2024   ALKPHOS 46 02/23/2024   AST 54 (H) 02/23/2024   ALT 39 02/23/2024   ANIONGAP 9 02/24/2024    CBG (last 3)  Recent Labs    02/26/24 1222 02/26/24 1559 02/26/24 1816  GLUCAP 353* 242* 235*      Coagulation Profile: No results for input(s): "INR", "PROTIME" in the last 168 hours.   Radiology Studies: No results found.      Kathlen Mody M.D. [name] 02/26/2024, 7:01 PM  Available via Epic secure chat 7am-7pm After 7 pm, please refer to night coverage provider listed on amion.

## 2024-02-26 NOTE — Telephone Encounter (Signed)
 Received message to get pt scheduled for post hospital follow up. Lvm.

## 2024-02-26 NOTE — Progress Notes (Signed)
 [name]                                                                               Earl [name], is a 83 y.o. male, DOB - 30-Mar-1941, XLK:[id number] Admit date - 02/14/2024    Outpatient Primary MD for the patient is [name], MD  LOS - 10  days    Brief summary   83 year old gentleman with history of type 2 diabetes, hypertension, hyperlipidemia, CHF, A-fib on Eliquis, history of stroke with residual right-sided weakness, MGUS, GERD, BPH, hypothyroidism brought to the ED by EMS due to worsening generalized weakness.  Patient seen in the ED workup concerning for COVID-19 infection.  CT head MRI brain done negative for any acute abnormalities.   Unfortunately patient continues to be lethargic, with minimal interaction,.    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 ? From polyneuropathy from amyloidosis vs from COVID 19 infection.  On Baclofen which was stopped  on 4/3.  He is more alert and conversing and oriented.  Initial MRI of the brain is negative. Repeat CT head is negative for acute stroke.  Patient continuing to work with PT.     COVID-19 infection Patient presented with generalized weakness, lethargy.  Patient denies having any respiratory symptoms COVID PCR positive, influenza A and B PCR are negative Chest x-ray is negative for pneumonia. Patient is on room air.    Hyperkalemia S/p Lokelma Resolved Patient was on Aldactone and Entresto which are on hold.   Rhabdomyolysis  CK levels continue to remain elevated. Will start him on gentle hydration and monitor CK levels.  Renal function stable. Repeat CK levels in am.    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 Last echo showed LVEF of 45 to 50%. Left ventricle demonstrates global hypokinesis. LVEF diastolic parameters show grade II diastolic dysfunction.  Torsemide, entresto are on hold for now in view of multiple episodes of syncope and hypotension.  Cardiology on board and recommending to continue with spironolactone at a low dose.    Insulin dependent diabetes mellitus:  CBG (last 3)  Recent Labs    02/26/24 1222 02/26/24 1559 02/26/24 1816  GLUCAP 353* 242* 235*   Resume SSI. A1c IS 6.8%. increase to 7 units of Semglee and add 4 units of novolog TIDAC to SSI.    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 Last follow up with Dr Clelia Croft in 2023.  Obtain MM panel.     Hyperlipidemia Resume statin.    BPH Continue with proscar.   Vasovagal syncope in the setting of autonomic neuropathy -Patient noted to have a vasovagal episode, 02/16/2024, while sitting on the bedside commode currently back to baseline. -MRI brain done on admission negative for any acute strokes. -2D echo done 08/2023 with EF of 45 to 50%, left ventricular global hypokinesis, grade 2 DD, no severe valvular abnormalities. -BP noted to be  soft on 02/16/2024 and assessed patient hydrated gently with IV fluids.   -BP improved.  -Patient noted to have another vasovagal episode of syncope while on the bedside commode having a large bowel movement per RN on 02/19/2024.   - BP parameters have improved and we continue to hold entresto, torsemide.  - check orthostatic vital signs with PT next session pls.    PAF Continue with toprol for rate control. On Eliquis for anti coagulation.    History of stroke with residual right-sided weakness/hyperlipidemia -holding lipitor for elevated CK levels.  Continue with eliquis    Mild Pancytopenia ? From COVID INFECTION. Improving.   Fever:  Work up has been negative.     Hypothyroidism:  Resume Synthroid. TSH wnl.  In view of  multiple medical issues, poor progression,requested palliative care for goals of care.  Plan for outpatient palliative care services to follow on discharge to SNF when stable.    Estimated body mass index is 22.18 kg/m as calculated from the following:   Height as of this encounter: 5\' 11"  (1.803 m).   Weight as of this encounter: 72.1 kg.  Code Status:  full code.  DVT Prophylaxis:  Place TED hose Start: 02/22/24 1448 apixaban (ELIQUIS) tablet 5 mg   Level of Care: Level of care: Progressive Family Communication: none at bedside today.   Disposition Plan:     Remains inpatient appropriate:  pending clinical improvement.   Procedures:  MRI brain without contrast.   Consultants:   Cardiology Palliative care consult.  Antimicrobials:   Anti-infectives (From admission, onward)    None        Medications  Scheduled Meds:  apixaban  5 mg Oral BID   benzonatate  100 mg Oral TID   cholecalciferol  1,000 Units Oral Daily   feeding supplement  237 mL Oral BID BM   ferrous sulfate  325 mg Oral QHS   finasteride  5 mg Oral Daily   insulin aspart  0-5 Units Subcutaneous QHS   insulin aspart  0-9 Units Subcutaneous TID WC   insulin aspart  4 Units Subcutaneous TID WC   insulin glargine-yfgn  7 Units Subcutaneous Daily   leptospermum manuka honey  1 Application Topical Daily   levothyroxine  25 mcg Oral Q0600   magnesium oxide  200 mg Oral Daily   metoprolol succinate  25 mg Oral QHS   senna-docusate  2 tablet Oral BID   Tafamidis  1 capsule Oral Daily   vitamin B-12  50 mcg Oral Daily   Continuous Infusions:  sodium chloride 75 mL/hr at 02/26/24 1314    PRN Meds:.acetaminophen **OR** acetaminophen, albuterol, bisacodyl, metoprolol tartrate, phenol, polyethylene glycol, traMADol    Subjective:   Thurmond Hildebran was seen and examined today.   Alert , oriented today. No new complaints.  Objective:   Vitals:   02/26/24 0532 02/26/24 0904 02/26/24 1221 02/26/24  1624  BP:  113/82 122/73 125/73  Pulse:  76 86 77  Resp: 14 20 16 20   Temp:  98.2 F (36.8 C) 98.6 F (37 C) 97.7 F (36.5 C)  TempSrc:  Oral  Axillary  SpO2:  100% 100% 99%  Weight:      Height:        Intake/Output Summary (Last 24 hours) at 02/26/2024 1900 Last data filed at 02/26/2024 0950 Gross per 24 hour  Intake 263 ml  Output 1400 ml  Net -1137 ml   Filed Weights   02/14/24 1952  Weight: 72.1  kg     Exam General exam: elderly gentleman, not in distress.  Respiratory system: Clear to auscultation. Respiratory effort normal. Cardiovascular system: S1 & S2 heard, RRR. No JVD,  Gastrointestinal system: Abdomen is nondistended, soft and nontender.  Central nervous system: Alert and oriented. Extremities: Symmetric 5 x 5 power. Skin: No rashes,  Psychiatry:  Mood & affect appropriate.       Data Reviewed:  I have personally reviewed following labs and imaging studies   CBC Lab Results  Component Value Date   WBC 3.9 (L) 02/23/2024   RBC 3.98 (L) 02/23/2024   HGB 11.7 (L) 02/23/2024   HCT 34.0 (L) 02/23/2024   MCV 85.4 02/23/2024   MCH 29.4 02/23/2024   PLT 169 02/23/2024   MCHC 34.4 02/23/2024   RDW 13.6 02/23/2024   LYMPHSABS 0.7 02/23/2024   MONOABS 0.4 02/23/2024   EOSABS 0.0 02/23/2024   BASOSABS 0.0 02/23/2024     Last metabolic panel Lab Results  Component Value Date   NA 138 02/24/2024   K 4.1 02/24/2024   CL 109 02/24/2024   CO2 20 (L) 02/24/2024   BUN 21 02/24/2024   CREATININE 1.18 02/24/2024   GLUCOSE 215 (H) 02/24/2024   GFRNONAA >60 02/24/2024   GFRAA 77 05/17/2021   CALCIUM 7.6 (L) 02/24/2024   PHOS 2.8 02/20/2024   PROT 4.6 (L) 02/23/2024   ALBUMIN 1.8 (L) 02/23/2024   LABGLOB 2.4 12/22/2021   BILITOT 0.4 02/23/2024   ALKPHOS 46 02/23/2024   AST 54 (H) 02/23/2024   ALT 39 02/23/2024   ANIONGAP 9 02/24/2024    CBG (last 3)  Recent Labs    02/26/24 1222 02/26/24 1559 02/26/24 1816  GLUCAP 353* 242* 235*       Coagulation Profile: No results for input(s): "INR", "PROTIME" in the last 168 hours.   Radiology Studies: No results found.      Kathlen Mody M.D. [name] 02/26/2024, 7:00 PM  Available via Epic secure chat 7am-7pm After 7 pm, please refer to night coverage provider listed on amion.

## 2024-02-26 NOTE — Progress Notes (Signed)
 Physical Therapy Treatment Patient Details Name: Earl [name] MRN: [id number] DOB: Jun 15, 1941 Today's Date: 02/26/2024   History of Present Illness Pt is 83 yo presenting to [facility] on 3/26 due to generalized weakness, L lean all day and fall. PMH includes PAF, cardiac amyloidosis, CHF, CVA, DMII, MGUS, HTN, FTT.    PT Comments  Patient progressing some from last session.  Seems to do better with pulling up on Stedy than with RW with less posterior lean and eventually able to stand erect.  He could not tolerate standing long though while wearing knee high compression socks noted BP drop though only 18 mmHg drop.  Patient some what limited by back pain during mobility as well.  Hopeful for continued improvement with post-acute inpatient rehab.  PT will continue to follow.  Orthostatic VS for the past 24 hrs (Last 3 readings):  BP- Lying Pulse- Lying BP- Sitting Pulse- Sitting BP- Standing at 0 minutes Pulse- Standing at 0 minutes  02/26/24 1243 122/73 86 116/74 91 104/76 100       If plan is discharge home, recommend the following: Assistance with cooking/housework;Assist for transportation;Help with stairs or ramp for entrance;Two people to help with walking and/or transfers   Can travel by private vehicle     No  Equipment Recommendations  Other (comment) (TBA)    Recommendations for Other Services       Precautions / Restrictions Precautions Precautions: Fall Recall of Precautions/Restrictions: Intact     Mobility  Bed Mobility Overal bed mobility: Needs Assistance Bed Mobility: Rolling, Sidelying to Sit Rolling: Used rails, Mod assist Sidelying to sit: Mod assist, Used rails       General bed mobility comments: assist for legs off EOB and pt pushing up with A for completing rise and due to grimacing with pain    Transfers Overall transfer level: Needs assistance   Transfers: Sit to/from Stand, Bed to chair/wheelchair/BSC Sit to Stand: Min assist, From elevated  surface, +2 physical assistance           General transfer comment: stood eventually erect though much increased time to rise and some lifting help initially, pt pulling on Stedy and bed a little higher Transfer via Lift Equipment: Stedy  Ambulation/Gait                   Stairs             Wheelchair Mobility     Tilt Bed    Modified Rankin (Stroke Patients Only)       Balance Overall balance assessment: Needs assistance   Sitting balance-Leahy Scale: Poor Sitting balance - Comments: posterior lean, cues for holding rails for balance, initially mod A progressing to S with UE support   Standing balance support: Reliant on assistive device for balance, Bilateral upper extremity supported Standing balance-Leahy Scale: Poor Standing balance comment: UE support and min to mod A for balance in static standing                            Communication    Cognition Arousal: Alert Behavior During Therapy: Flat affect   PT - Cognitive impairments: Problem solving, Initiation                       PT - Cognition Comments: approprite, but slow to respond and needing extra time to follow commands Following commands: Impaired Following commands impaired: Only follows one step commands consistently,  Follows one step commands with increased time    Cueing    Exercises General Exercises - Lower Extremity Long Arc Quad: Strengthening, Both, 5 reps, Seated    General Comments General comments (skin integrity, edema, etc.): Orthostatics taken, see flowsheet, time spent applying knee hi compression socks prior to upright mobility.  Daughter in the room and supportive, encouraged OOB at least 1 hour      Pertinent Vitals/Pain Pain Assessment Faces Pain Scale: Hurts whole lot Pain Location: back with mobility Pain Descriptors / Indicators: Grimacing, Discomfort, Tightness Pain Intervention(s): Monitored during session, Premedicated before  session, Repositioned (premedicated not long prior to PT)    Home Living                          Prior Function            PT Goals (current goals can now be found in the care plan section) Progress towards PT goals: Progressing toward goals    Frequency    Min 2X/week      PT Plan      Co-evaluation              AM-PAC PT "6 Clicks" Mobility   Outcome Measure  Help needed turning from your back to your side while in a flat bed without using bedrails?: A Lot Help needed moving from lying on your back to sitting on the side of a flat bed without using bedrails?: A Lot Help needed moving to and from a bed to a chair (including a wheelchair)?: Total Help needed standing up from a chair using your arms (e.g., wheelchair or bedside chair)?: Total Help needed to walk in hospital room?: Total Help needed climbing 3-5 steps with a railing? : Total 6 Click Score: 8    End of Session Equipment Utilized During Treatment: Gait belt Activity Tolerance: Patient limited by pain Patient left: in chair;with call bell/phone within reach;with chair alarm set;with family/visitor present Nurse Communication: Need for lift equipment PT Visit Diagnosis: Other abnormalities of gait and mobility (R26.89);Pain Pain - part of body:  (back)     Time: 4098-1191 PT Time Calculation (min) (ACUTE ONLY): 25 min  Charges:    $Therapeutic Activity: 23-37 mins PT General Charges $$ ACUTE PT VISIT: 1 Visit                     Sheran Lawless, PT Acute Rehabilitation Services Office:[phone] 02/26/2024    Earl [name] 02/26/2024, 1:19 PM

## 2024-02-26 NOTE — TOC Progression Note (Signed)
 Transition of Care [facility]) - Progression Note    Patient Details  Name: Earl [name] MRN: [id number] Date of Birth: 19-Jul-1941  Transition of Care [facility]) CM/SW Contact  Nicanor Bake Phone Number: [phone] 02/26/2024, 2:01 PM  Clinical Narrative:   2:00 PM- HF CSW called the patients daughter to let her know that the ALF list that they requested has been printed and left on the patients hard chart.   HF CSW following for transition to SNF when medically stable, medical workup ongoing.     Expected Discharge Plan: Skilled Nursing Facility Barriers to Discharge: English as a second language teacher, Continued Medical Work up, SNF Covid  Expected Discharge Plan and Services     Post Acute Care Choice: Skilled Nursing Facility Living arrangements for the past 2 months: Single Family Home                                       Social Determinants of Health (SDOH) Interventions SDOH Screenings   Food Insecurity: No Food Insecurity (02/15/2024)  Housing: Low Risk  (02/15/2024)  Transportation Needs: No Transportation Needs (02/15/2024)  Utilities: Not At Risk (02/15/2024)  Depression (PHQ2-9): Low Risk  (10/03/2023)  Financial Resource Strain: Patient Declined (01/16/2024)  Physical Activity: Unknown (01/16/2024)  Social Connections: Moderately Integrated (01/16/2024)  Stress: Stress Concern Present (01/16/2024)  Tobacco Use: Low Risk  (02/14/2024)    Readmission Risk Interventions     No data to display

## 2024-02-26 NOTE — Progress Notes (Signed)
 Specialty Pharmacy Refill Coordination Note  Earl [name] is a 83 y.o. male contacted today regarding refills of specialty medication(s) Tafamidis   Spoke with patient's daughter - Para  Patient requested Delivery   Delivery date: 03/01/24   Verified address: Patient address [address]  [place]   Medication will be filled on 04.10.25.

## 2024-02-26 NOTE — Consult Note (Addendum)
 WOC Nurse Consult Note: Reason for Consult: wound coccyx  Wound type: Unstageable Pressure Injury Coccyx  Pressure Injury POA: no, documented as irritant contact dermatitis 3/31, admitted 3/27  Measurement: see nursing flowsheet  Wound bed: 80% yellow black necrotic 20% pink moist  Drainage (amount, consistency, odor) foul smelling purulent drainage per bedside nurse  Periwound: scattered Stage 2 to buttocks  Dressing procedure/placement/frequency: Cleanse coccyx/buttocks wound with Vashe wound cleanser [place] [phone]) do not rinse and allow to air dry. Apply Medihoney to wound bed daily, cover with dry gauze. Apply silicone foam or ABD pad to cover whichever is preferred.   Patient would benefit from a low air loss mattress for pressure redistribution and moisture management.    POC discussed with bedside nurse. WOC team will follow every 7 to 10 days to assess wound and change POC as needed.   Thank you,    Priscella Mann MSN, RN-BC, [employer] [phone]

## 2024-02-27 DIAGNOSIS — Z7901 Long term (current) use of anticoagulants: Secondary | ICD-10-CM | POA: Diagnosis not present

## 2024-02-27 DIAGNOSIS — U071 COVID-19: Secondary | ICD-10-CM | POA: Diagnosis not present

## 2024-02-27 DIAGNOSIS — E440 Moderate protein-calorie malnutrition: Secondary | ICD-10-CM | POA: Insufficient documentation

## 2024-02-27 DIAGNOSIS — E039 Hypothyroidism, unspecified: Secondary | ICD-10-CM | POA: Diagnosis not present

## 2024-02-27 DIAGNOSIS — I480 Paroxysmal atrial fibrillation: Secondary | ICD-10-CM | POA: Diagnosis not present

## 2024-02-27 LAB — CBC WITH DIFFERENTIAL/PLATELET
Abs Immature Granulocytes: 0.09 10*3/uL — ABNORMAL HIGH (ref 0.00–0.07)
Basophils Absolute: 0 10*3/uL (ref 0.0–0.1)
Basophils Relative: 0 %
Eosinophils Absolute: 0.1 10*3/uL (ref 0.0–0.5)
Eosinophils Relative: 2 %
HCT: 36.6 % — ABNORMAL LOW (ref 39.0–52.0)
Hemoglobin: 12.4 g/dL — ABNORMAL LOW (ref 13.0–17.0)
Immature Granulocytes: 1 %
Lymphocytes Relative: 17 %
Lymphs Abs: 1.5 10*3/uL (ref 0.7–4.0)
MCH: 29.2 pg (ref 26.0–34.0)
MCHC: 33.9 g/dL (ref 30.0–36.0)
MCV: 86.3 fL (ref 80.0–100.0)
Monocytes Absolute: 0.7 10*3/uL (ref 0.1–1.0)
Monocytes Relative: 7 %
Neutro Abs: 6.5 10*3/uL (ref 1.7–7.7)
Neutrophils Relative %: 73 %
Platelets: 339 10*3/uL (ref 150–400)
RBC: 4.24 MIL/uL (ref 4.22–5.81)
RDW: 13.4 % (ref 11.5–15.5)
WBC: 8.9 10*3/uL (ref 4.0–10.5)
nRBC: 0 % (ref 0.0–0.2)

## 2024-02-27 LAB — GLUCOSE, CAPILLARY
Glucose-Capillary: 66 mg/dL — ABNORMAL LOW (ref 70–99)
Glucose-Capillary: 78 mg/dL (ref 70–99)
Glucose-Capillary: 280 mg/dL — ABNORMAL HIGH (ref 70–99)
Glucose-Capillary: 209 mg/dL — ABNORMAL HIGH (ref 70–99)
Glucose-Capillary: 70 mg/dL (ref 70–99)

## 2024-02-27 LAB — BASIC METABOLIC PANEL WITH GFR
Anion gap: 11 (ref 5–15)
BUN: 17 mg/dL (ref 8–23)
CO2: 23 mmol/L (ref 22–32)
Calcium: 8.1 mg/dL — ABNORMAL LOW (ref 8.9–10.3)
Chloride: 105 mmol/L (ref 98–111)
Creatinine, Ser: 0.96 mg/dL (ref 0.61–1.24)
GFR, Estimated: >60 mL/min (ref >60)
Glucose, Bld: 125 mg/dL — ABNORMAL HIGH (ref 70–99)
Potassium: 4.1 mmol/L (ref 3.5–5.1)
Sodium: 139 mmol/L (ref 135–145)

## 2024-02-27 LAB — MULTIPLE MYELOMA PANEL, SERUM
Albumin SerPl Elph-Mcnc: 2.2 g/dL — ABNORMAL LOW (ref 2.9–4.4)
Albumin/Glob SerPl: 1 (ref 0.7–1.7)
Alpha 1: 0.2 g/dL (ref 0.0–0.4)
Alpha2 Glob SerPl Elph-Mcnc: 0.9 g/dL (ref 0.4–1.0)
B-Globulin SerPl Elph-Mcnc: 0.9 g/dL (ref 0.7–1.3)
Gamma Glob SerPl Elph-Mcnc: 0.4 g/dL (ref 0.4–1.8)
Globulin, Total: 2.4 g/dL (ref 2.2–3.9)
IgA: 272 mg/dL (ref 61–437)
IgG (Immunoglobin G), Serum: 473 mg/dL — ABNORMAL LOW (ref 603–1613)
IgM (Immunoglobulin M), Srm: 16 mg/dL (ref 15–143)
M Protein SerPl Elph-Mcnc: 0.3 g/dL — ABNORMAL HIGH
Total Protein ELP: 4.6 g/dL — ABNORMAL LOW (ref 6.0–8.5)

## 2024-02-27 LAB — CK: Total CK: 501 U/L — ABNORMAL HIGH (ref 49–397)

## 2024-02-27 MED ORDER — INSULIN GLARGINE-YFGN 100 UNIT/ML ~~LOC~~ SOLN
5.0000 [IU] | Freq: Every day | SUBCUTANEOUS | Status: DC
  Administered 2024-02-28 – 2024-02-29 (×2): 5 [IU] via SUBCUTANEOUS
  Filled 2024-02-27 (×2): qty 0.05

## 2024-02-27 MED ORDER — GLUCERNA SHAKE PO LIQD
237.0000 mL | Freq: Three times a day (TID) | ORAL | Status: DC
  Administered 2024-02-27 – 2024-02-28 (×5): 237 mL via ORAL

## 2024-02-27 NOTE — Plan of Care (Signed)
 No acute changes. Conts on IVF, pain and blood glucose management.   Problem: Fluid Volume: Goal: Ability to maintain a balanced intake and output will improve Outcome: Not Met (add Reason)   Problem: Skin Integrity: Goal: Risk for impaired skin integrity will decrease Outcome: Not Met (add Reason)   Problem: Tissue Perfusion: Goal: Adequacy of tissue perfusion will improve Outcome: Not Met (add Reason)   Problem: Safety: Goal: Ability to remain free from injury will improve Outcome: Not Met (add Reason)   Problem: Pain Managment: Goal: General experience of comfort will improve and/or be controlled Outcome: Not Met (add Reason)

## 2024-02-27 NOTE — Inpatient Diabetes Management (Signed)
 Inpatient Diabetes Program Recommendations  AACE/ADA: New Consensus Statement on Inpatient Glycemic Control (2015)  Target Ranges:  Prepandial:   less than 140 mg/dL      Peak postprandial:   less than 180 mg/dL (1-2 hours)      Critically ill patients:  140 - 180 mg/dL   Lab Results  Component Value Date   GLUCAP 78 02/27/2024   HGBA1C 8.4 (H) 02/22/2024    Review of Glycemic Control  Latest Reference Range & Units 02/26/24 06:38 02/26/24 12:22 02/26/24 15:59 02/26/24 18:16 02/26/24 21:12 02/27/24 06:10 02/27/24 06:38  Glucose-Capillary 70 - 99 mg/dL 536 (H) 644 (H) 034 (H) 235 (H) 151 (H) 66 (L) 78  (H): Data is abnormally high (L): Data is abnormally low  Diabetes history: DM2 Outpatient Diabetes medications: metformin 1000 mg QD Current orders for Inpatient glycemic control: Semglee 7 units every day, Novolog 0-9 units TID and 0-5 units at bedtime, 4 units TID with meals   Inpatient Diabetes Program Recommendations:    Might consider:  Semglee 5 units QAM  Will continue to follow while inpatient.  Thank you, Dulce Sellar, MSN, CDCES Diabetes Coordinator Inpatient Diabetes Program [phone] (team pager from 8a-5p)

## 2024-02-27 NOTE — Progress Notes (Signed)
 Hypoglycemic Event  CBG: 66 mg/dL  Treatment: 4 oz juice/soda  Symptoms: None  Follow-up CBG: Time:06:38H CBG Result: 78mg /dL  Possible Reasons for Event: Unknown  Comments/MD notified: Dr. Karie Schwalbe. Antionette Char, MD    [place]

## 2024-02-27 NOTE — Progress Notes (Signed)
 Occupational Therapy Treatment Patient Details Name: Earl [name] MRN: [id number] DOB: Apr 01, 1941 Today's Date: 02/27/2024   History of present illness Pt is 83 yo presenting to [facility] on 3/26 due to generalized weakness, L lean all day and fall. PMH includes PAF, cardiac amyloidosis, CHF, CVA, DMII, MGUS, HTN, FTT.   OT comments  Pt making good progress towards goals, progressing bed mobility to Mod A and transferring via [place] with Min A x 2 for safety. On entry, pt with bowel incontinence in bed and required extended time for cleanup prior to OOB attempts. BP WFL pre and post activity w/ abdominal binder donned. Patient will benefit from continued inpatient follow up therapy, <3 hours/day at DC.      If plan is discharge home, recommend the following:  A lot of help with walking and/or transfers;A lot of help with bathing/dressing/bathroom;Assistance with cooking/housework;Assist for transportation;Help with stairs or ramp for entrance;Two people to help with walking and/or transfers   Equipment Recommendations  Other (comment) (TBD)    Recommendations for Other Services      Precautions / Restrictions Precautions Precautions: Fall Recall of Precautions/Restrictions: Intact Restrictions Weight Bearing Restrictions Per Provider Order: No       Mobility Bed Mobility Overal bed mobility: Needs Assistance Bed Mobility: Rolling, Sidelying to Sit Rolling: Min assist, Used rails Sidelying to sit: Mod assist, HOB elevated, Used rails       General bed mobility comments: Min A to roll side to side for peri care. Mod A to sit EOB with good effort to bring LE off of bed, use of bedrails + assist to lift trunk    Transfers Overall transfer level: Needs assistance Equipment used: Ambulation equipment used Transfers: Sit to/from Stand, Bed to chair/wheelchair/BSC Sit to Stand: Min assist, From elevated surface, +2 physical assistance           General transfer comment: Min A  (+2 present for safety) to pull self to correct posterior bias and stand in [place]. Transferred to recliner via Scientist, water quality: [employer] Overall balance assessment: Needs assistance Sitting-balance support: Feet supported, No upper extremity supported, Single extremity supported, Bilateral upper extremity supported Sitting balance-Leahy Scale: Poor   Postural control: Posterior lean Standing balance support: Reliant on assistive device for balance, Bilateral upper extremity supported Standing balance-Leahy Scale: Poor                             ADL either performed or assessed with clinical judgement   ADL Overall ADL's : Needs assistance/impaired Eating/Feeding: Set up;Sitting Eating/Feeding Details (indicate cue type and reason): assist to open containers and prep food                         Toileting- Clothing Manipulation and Hygiene: Total assistance;Bed level Toileting - Clothing Manipulation Details (indicate cue type and reason): Total A bed level after bowel incontinence in bed            Extremity/Trunk Assessment Upper Extremity Assessment Upper Extremity Assessment: Generalized weakness;Right hand dominant   Lower Extremity Assessment Lower Extremity Assessment: Defer to PT evaluation        Vision   Vision Assessment?: No apparent visual deficits   Perception     Praxis     Communication Communication Communication: Impaired Factors Affecting Communication: Difficulty expressing self   Cognition Arousal: Alert Behavior During Therapy: Flat affect Cognition: No family/caregiver present  to determine baseline             OT - Cognition Comments: pt slower processing but pleasant and follows directions well.                 Following commands: Impaired Following commands impaired: Follows one step commands with increased time, Follows multi-step commands with increased time      Cueing    Cueing Techniques: Verbal cues, Tactile cues, Visual cues  Exercises      Shoulder Instructions       General Comments Abdominal binder on during session. BP WFL pre and post activity    Pertinent Vitals/ Pain       Pain Assessment Pain Assessment: Faces Faces Pain Scale: Hurts even more Pain Location: bottom with movement Pain Descriptors / Indicators: Grimacing, Guarding, Sore Pain Intervention(s): Monitored during session, Limited activity within patient's tolerance, Repositioned  Home Living                                          Prior Functioning/Environment              Frequency  Min 2X/week        Progress Toward Goals  OT Goals(current goals can now be found in the care plan section)  Progress towards OT goals: Progressing toward goals  Acute Rehab OT Goals Patient Stated Goal: move around more, get cleaned up, decrease bottom pain OT Goal Formulation: With patient Time For Goal Achievement: 03/02/24 Potential to Achieve Goals: Good ADL Goals Pt Will Perform Grooming: with contact guard assist;standing Pt Will Perform Upper Body Dressing: with supervision;sitting Pt Will Perform Lower Body Dressing: sit to/from stand;with contact guard assist Pt Will Transfer to Toilet: with contact guard assist;ambulating;regular height toilet Pt Will Perform Toileting - Clothing Manipulation and hygiene: sit to/from stand;with contact guard assist  Plan      Co-evaluation                 AM-PAC OT "6 Clicks" Daily Activity     Outcome Measure   Help from another person eating meals?: None Help from another person taking care of personal grooming?: A Little Help from another person toileting, which includes using toliet, bedpan, or urinal?: A Lot Help from another person bathing (including washing, rinsing, drying)?: A Lot Help from another person to put on and taking off regular upper body clothing?: A Little Help from another person  to put on and taking off regular lower body clothing?: A Lot 6 Click Score: 16    End of Session Equipment Utilized During Treatment: Gait belt  OT Visit Diagnosis: Unsteadiness on feet (R26.81);Other abnormalities of gait and mobility (R26.89);Muscle weakness (generalized) (M62.81);History of falling (Z91.81)   Activity Tolerance Patient tolerated treatment well   Patient Left in chair;with call bell/phone within reach;with chair alarm set   Nurse Communication Mobility status;Other (comment) (need for new primofit and bandage)        Time: 0822-0911 OT Time Calculation (min): 49 min  Charges: OT General Charges $OT Visit: 1 Visit OT Treatments $Self Care/Home Management : 23-37 mins $Therapeutic Activity: 8-22 mins  Bradd Canary, OTR/L Acute Rehab Services Office: [phone]   Lorre Munroe 02/27/2024, 9:21 AM

## 2024-02-27 NOTE — Plan of Care (Signed)
  Problem: Coping: Goal: Ability to adjust to condition or change in health will improve Outcome: Progressing   Problem: Nutritional: Goal: Maintenance of adequate nutrition will improve Outcome: Progressing Goal: Progress toward achieving an optimal weight will improve Outcome: Progressing   Problem: Skin Integrity: Goal: Risk for impaired skin integrity will decrease Outcome: Progressing   Problem: Clinical Measurements: Goal: Ability to maintain clinical measurements within normal limits will improve Outcome: Progressing Goal: Will remain free from infection Outcome: Progressing Goal: Diagnostic test results will improve Outcome: Progressing Goal: Respiratory complications will improve Outcome: Progressing Goal: Cardiovascular complication will be avoided Outcome: Progressing   Problem: Pain Managment: Goal: General experience of comfort will improve and/or be controlled Outcome: Progressing   Problem: Safety: Goal: Ability to remain free from injury will improve Outcome: Progressing   Problem: Elimination: Goal: Will not experience complications related to bowel motility Outcome: Progressing Goal: Will not experience complications related to urinary retention Outcome: Progressing

## 2024-02-27 NOTE — Progress Notes (Signed)
 Initial Nutrition Assessment  DOCUMENTATION CODES:   Non-severe (moderate) malnutrition in context of acute illness/injury (COVID infection)  INTERVENTION:  Liberalize diet to carb modified to provide increased options and promote adequate intake that meets nutrition needs  Glucerna Shake po BID, each supplement provides 220 kcal and 10 grams of protein  8pm snack to help pt meet calorie and protein needs and prevent overnight hypoglycemia episodes  NUTRITION DIAGNOSIS:  Moderate Malnutrition related to acute illness (COVID illness) as evidenced by mild fat depletion, mild muscle depletion.  GOAL:  Patient will meet greater than or equal to 90% of their needs  MONITOR:  PO intake, Supplement acceptance, Labs  REASON FOR ASSESSMENT:  Consult Assessment of nutrition requirement/status  ASSESSMENT:  Pt with hx of type 2 diabetes, HTN, HLD, a fib (on eliquis), hypothyroidism, and GERD. Pt admitted for workup of suspected COVID infection following generalized weakness and acute encephalopathy.  3/27 admitted to [facility], positive for COVID, negative for pneumonia  4/3 continued lethargy and confusion of pt  Pt on baclofen until 4/3 to help polyneuropathy. MRI and CT negative for stroke. Diagnosed with rhabdomyolysis, treating with fluids.  Spoke with pt who was awake and alert. Pt reports his appetite has been good the last few days. Prior to the last few days, pt reports appetite was not good due to illness and not feeling well, but appetite has gotten much better over admission. Diet summary documentation shows 61% average meal intake over 8 meals, but has nothing recorded for yesterday 4/7. Pt reports he ate all 3 meals yesterday and was able to eat majority of the plate. Discussed liberalizing diet to carb modified to help provide increased options for pt while continuing to monitor and limit carbohydrate intake due to increased blood sugar levels, pt agreeable. Encouraged pt to continue  to eat well at each meal and discussed adding an 8pm snack to help prevent blood sugar lows overnight (CBG of 66 last night). Pt reports he was drinking ONS prior to admission, encouraged him to drink Glucerna during admission to help meet increased protein needs for wound healing without impacting blood sugars.  PTA, pt reports he was eating 3x per day and ate variety of foods. He reports eating a breakfast sandwich for breakfast, a sandwich with meat and vegetables for lunch, and a vegetable with chicken/meat for dinner. Pt reports his blood sugar was consistent at home and reported taking oral medication twice a day to treat diabetes. Most recent A1c 8.4%. Pt reports taking supplements each day consisting of vitamin D3, B12, magnesium, and multivitamin, but recent labs values suggest B12 and D within normal levels.   Pt reports typical weight of 151# and reports no recent weight loss. Suspect weight in chart to be copy forwarded from previous weights, asked RN to get updated weight and she agreed. Pt reports he recently weighed himself and it was around his typical. Per chart review, last recorded weight around his typical was 11/16/23. Nutrition focused physical exam shows some mild fat and muscle depletions which may be due to recent illness and decline in appetite related to illness.  Mobility at baseline independent and reports no issues.   Average Meal Intake: 4/3-4/7: 61% average intake over 8 meals  Medications reviewed and include:  Vitamin D3 Ferrous sulfate SSI novolog Novolog 4 units TID Semglee daily Levothyroxine  Magnesium oxide Senna docusate Vitamin B12  Labs reviewed:  CBG over last 24 hours: 66-353 mg/dL Vitamin D 74 (40/98/11) Vitamin B12 778 (11/15/23)  A1c 8.4  NUTRITION - FOCUSED PHYSICAL EXAM:  Flowsheet Row Most Recent Value  Orbital Region Mild depletion  Upper Arm Region Mild depletion  Thoracic and Lumbar Region Mild depletion  Buccal Region No depletion   Temple Region Mild depletion  Clavicle Bone Region Mild depletion  Clavicle and Acromion Bone Region Mild depletion  Scapular Bone Region Mild depletion  Dorsal Hand No depletion  Patellar Region Mild depletion  Anterior Thigh Region Mild depletion  Posterior Calf Region Mild depletion  Edema (RD Assessment) None  Hair Reviewed  Eyes Reviewed  Mouth Reviewed  Skin Reviewed  Nails Reviewed   Diet Order:   Diet Order             Diet heart healthy/carb modified Room service appropriate? Yes; Fluid consistency: Thin  Diet effective now                   EDUCATION NEEDS:   Education needs have been addressed  Skin:  Skin Assessment: Skin Integrity Issues: Skin Integrity Issues:: Unstageable, DTI, Other (Comment) DTI: R heel and R great toe Unstageable: coccyx Other: wound: sacrum  Last BM:  4/7 type 6 and 7  Height:  Ht Readings from Last 1 Encounters:  02/25/24 5\' 11"  (1.803 m)   Weight:  Wt Readings from Last 1 Encounters:  02/14/24 72.1 kg   Ideal Body Weight:  78.2 kg  BMI:  Body mass index is 22.18 kg/m.  Estimated Nutritional Needs:   Kcal:  2000-2200  Protein:  95-105g  Fluid:  >2L  Louis Meckel Dietetic Intern

## 2024-02-27 NOTE — Care Management Important Message (Signed)
 Important Message  Patient Details  Name: Earl [name] MRN: [id number] Date of Birth: Dec 03, 1940   Important Message Given:  Yes - Medicare IM     Dorena Bodo 02/27/2024, 8:53 AM

## 2024-02-27 NOTE — TOC Progression Note (Addendum)
 Transition of Care [facility]) - Progression Note    Patient Details  Name: Earl [name] MRN: [id number] Date of Birth: [birth year]  Transition of [facility] CM/SW Contact  Baldemar Lenis, [place] Phone Number: 02/27/2024, 10:35 AM  Clinical Narrative:   Patient now off covid isolation. CSW confirmed with [facility] that bed is available for patient. CSW requested CMA to initiate insurance authorization for SNF when patient is medically stable.  CSW received message from patient's daughter, Para, with concerns related to patient's wound. CSW relayed message to MD who will follow up with daughter. CSW to follow.  UPDATE: CSW spoke with daughter, [name], to answer questions about transition to SNF and timing, as well as concerns about nutrition and wound care related to incontinence. CSW answered questions as able, and relayed concerns to MD to address related to wound care questions. MD has placed nutrition consult. CSW to follow.    Expected Discharge Plan: Skilled Nursing Facility Barriers to Discharge: Continued Medical Work up, English as a second language teacher  Expected Discharge Plan and Services     Post Acute Care Choice: Skilled Nursing Facility Living arrangements for the past 2 months: Single Family Home                                       Social Determinants of Health (SDOH) Interventions SDOH Screenings   Food Insecurity: No Food Insecurity (02/15/2024)  Housing: Low Risk  (02/15/2024)  Transportation Needs: No Transportation Needs (02/15/2024)  Utilities: Not At Risk (02/15/2024)  Depression (PHQ2-9): Low Risk  (10/03/2023)  Financial Resource Strain: Patient Declined (01/16/2024)  Physical Activity: Unknown (01/16/2024)  Social Connections: Moderately Integrated (01/16/2024)  Stress: Stress Concern Present (01/16/2024)  Tobacco Use: Low Risk  (02/14/2024)    Readmission Risk Interventions     No data to display

## 2024-02-27 NOTE — Progress Notes (Signed)
 [name]                                                                               Earl [name], is a 83 y.o. male, DOB - 1941-09-06, ZOX:[id number] Admit date - 02/14/2024    Outpatient Primary MD for the patient is Earl Sheffield, MD  LOS - 11  days    Brief summary   83 year old gentleman with history of type 2 diabetes, hypertension, hyperlipidemia, CHF, A-fib on Eliquis, history of stroke with residual right-sided weakness, MGUS, GERD, BPH, hypothyroidism brought to the ED by EMS due to worsening generalized weakness.  Patient seen in the ED workup concerning for COVID-19 infection.  CT head MRI brain negative for any acute abnormalities.  His mental status improved after stopping baclofen and started him on IV fluids.  He is more alert and working with PT and is close to discharge to SNF.    Assessment & Plan    Assessment and Plan:  Generalized Weakness/ Acute encephalopathy of unclear etiology :  Appears to have improved.  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 ? From polyneuropathy from amyloidosis vs from COVID 19 infection.  On Baclofen which was stopped  on 4/3.  He is more alert and oriented.  Initial MRI of the brain is negative. Repeat CT head is negative for acute stroke.  Patient continuing to work with PT.  His main issue is pain from the pressure injury on the back.      COVID-19 infection Patient presented with generalized weakness, lethargy.  Patient denies having any respiratory symptoms COVID PCR positive, influenza A and B PCR are negative Chest x-ray is negative for pneumonia. Patient is on room air.    Hyperkalemia S/p Lokelma Resolved Patient was on Aldactone and Entresto which are discontinued.    Rhabdomyolysis Improving with IV fluids.  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 Last echo showed LVEF of 45 to 50%. Left ventricle demonstrates global hypokinesis. LVEF diastolic parameters show grade II diastolic dysfunction.  Torsemide, entresto are on hold for now in view of multiple episodes of syncope and hypotension.   Trial of aldactone last week, but patient became lethargic, hypotensive, and it was discontinued.    Insulin dependent diabetes mellitus:  CBG (last 3)  Recent Labs    02/27/24 0610 02/27/24 0638 02/27/24 1213  GLUCAP 66* 78 280*   Resume SSI. A1c IS 8.4% An episode of hypoglycemia.  Decreased the dose of semglee to 5 units and continue with SSI.     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 Last follow up with Dr Clelia Croft in 2023.  MM panel ordered.  Recommend outpatient follow up with oncology/ hematology outpatient. Ambulatory referral to hematology on discharge.     Hyperlipidemia Resume statin.    BPH Continue with proscar.   Vasovagal syncope in the setting of autonomic neuropathy -Patient noted to have a vasovagal episode, 02/16/2024, while sitting on the bedside commode currently back to baseline. -MRI brain  done on admission negative for any acute strokes. -2D echo done 08/2023 with EF of 45 to 50%, left ventricular global hypokinesis, grade 2 DD, no severe valvular abnormalities. -BP noted to be soft on 02/16/2024 and assessed patient hydrated gently with IV fluids.   -BP improved.  -Patient noted to have another vasovagal episode of syncope while on the bedside commode having a large bowel movement per RN on 02/19/2024.   - BP parameters have improved and we continue to hold entresto, torsemide and aldactone.     PAF Continue with toprol for rate control. On Eliquis for anti coagulation.    History of stroke with residual right-sided weakness/hyperlipidemia -holding lipitor for elevated CK levels.  Continue with eliquis    Mild  Pancytopenia ? From COVID INFECTION. Improving.    Hypothyroidism:  Resume Synthroid. TSH wnl.   Pressure injury over the right heel / back:  Pressure Injury 02/26/24 Coccyx Mid Unstageable - Full thickness tissue loss in which the base of the injury is covered by slough (yellow, tan, gray, green or brown) and/or eschar (tan, brown or black) in the wound bed. (Active)  02/26/24 0916  Location: Coccyx  Location Orientation: Mid  Staging: Unstageable - Full thickness tissue loss in which the base of the injury is covered by slough (yellow, tan, gray, green or brown) and/or eschar (tan, brown or black) in the wound bed.  Wound Description (Comments):   Present on Admission: No     Pressure Injury 02/26/24 Heel Right Deep Tissue Pressure Injury - Purple or maroon localized area of discolored intact skin or blood-filled blister due to damage of underlying soft tissue from pressure and/or shear. purple (Active)  02/26/24 1213  Location: Heel  Location Orientation: Right  Staging: Deep Tissue Pressure Injury - Purple or maroon localized area of discolored intact skin or blood-filled blister due to damage of underlying soft tissue from pressure and/or shear.  Wound Description (Comments): purple  Present on Admission:      Pressure Injury 02/26/24 Toe (Comment  which one) Anterior;Right Deep Tissue Pressure Injury - Purple or maroon localized area of discolored intact skin or blood-filled blister due to damage of underlying soft tissue from pressure and/or shear. right gre (Active)  02/26/24 1215  Location: Toe (Comment  which one)  Location Orientation: Anterior;Right  Staging: Deep Tissue Pressure Injury - Purple or maroon localized area of discolored intact skin or blood-filled blister due to damage of underlying soft tissue from pressure and/or shear.  Wound Description (Comments): right great toe  Present on Admission:   Wound care consulted and recommendations given.      In view  of multiple medical issues, poor progression,requested palliative care for goals of care.  Plan for outpatient palliative care services to follow on discharge to SNF.   Estimated body mass index is 22.18 kg/m as calculated from the following:   Height as of this encounter: 5\' 11"  (1.803 m).   Weight as of this encounter: 72.1 kg.  Code Status:  DNR /DNI.  DVT Prophylaxis:  Place TED hose Start: 02/22/24 1448 apixaban (ELIQUIS) tablet 5 mg   Level of Care: Level of care: Progressive Family Communication: discussed the plan with daughter and son over the phone.   Disposition Plan:     Remains inpatient appropriate:   d/c to SNF IN AM.  Procedures:  MRI brain without contrast.   Consultants:   Cardiology Palliative care consult.  Antimicrobials:   Anti-infectives (From admission, onward)    None  Medications  Scheduled Meds:  apixaban  5 mg Oral BID   benzonatate  100 mg Oral TID   cholecalciferol  1,000 Units Oral Daily   feeding supplement (GLUCERNA SHAKE)  237 mL Oral TID BM   ferrous sulfate  325 mg Oral QHS   finasteride  5 mg Oral Daily   insulin aspart  0-5 Units Subcutaneous QHS   insulin aspart  0-9 Units Subcutaneous TID WC   insulin aspart  4 Units Subcutaneous TID WC   [START ON 02/28/2024] insulin glargine-yfgn  5 Units Subcutaneous Daily   leptospermum manuka honey  1 Application Topical Daily   levothyroxine  25 mcg Oral Q0600   magnesium oxide  200 mg Oral Daily   metoprolol succinate  25 mg Oral QHS   senna-docusate  2 tablet Oral BID   Tafamidis  1 capsule Oral Daily   vitamin B-12  50 mcg Oral Daily   Continuous Infusions:  sodium chloride 75 mL/hr at 02/27/24 0215    PRN Meds:.acetaminophen **OR** acetaminophen, albuterol, bisacodyl, metoprolol tartrate, phenol, polyethylene glycol, traMADol    Subjective:   [place] was seen and examined today. Some pain int he back. Stood up with NT to clean the bed and dressing  change. No new complaints.   Vitals:   02/27/24 0026 02/27/24 0422 02/27/24 0830 02/27/24 1200  BP: 109/62 112/66 123/86 124/71  Pulse: 81 (!) 102 (!) 102 80  Resp: 16 14 18 19   Temp: 98.4 F (36.9 C) 97.6 F (36.4 C) 98.2 F (36.8 C) 99.3 F (37.4 C)  TempSrc:  Oral Oral Oral  SpO2: 97% 97% 99% 100%  Weight:      Height:        Intake/Output Summary (Last 24 hours) at 02/27/2024 1611 Last data filed at 02/27/2024 1500 Gross per 24 hour  Intake 2241.53 ml  Output 1550 ml  Net 691.53 ml   Filed Weights   02/14/24 1952  Weight: 72.1 kg     Exam General exam: Appears calm and comfortable  Respiratory system: Clear to auscultation. Respiratory effort normal. Cardiovascular system: S1 & S2 heard, RRR. No JVD,  Gastrointestinal system: Abdomen is nondistended, soft and nontender.  Central nervous system: Alert and oriented. No focal neurological deficits. Extremities: pressure injury( unstageable) on the bottom and heel ulcer.  Skin: No rashes,  Psychiatry:flat affect.        Data Reviewed:  I have personally reviewed following labs and imaging studies   CBC Lab Results  Component Value Date   WBC 8.9 02/27/2024   RBC 4.24 02/27/2024   HGB 12.4 (L) 02/27/2024   HCT 36.6 (L) 02/27/2024   MCV 86.3 02/27/2024   MCH 29.2 02/27/2024   PLT 339 02/27/2024   MCHC 33.9 02/27/2024   RDW 13.4 02/27/2024   LYMPHSABS 1.5 02/27/2024   MONOABS 0.7 02/27/2024   EOSABS 0.1 02/27/2024   BASOSABS 0.0 02/27/2024     Last metabolic panel Lab Results  Component Value Date   NA 139 02/27/2024   K 4.1 02/27/2024   CL 105 02/27/2024   CO2 23 02/27/2024   BUN 17 02/27/2024   CREATININE 0.96 02/27/2024   GLUCOSE 125 (H) 02/27/2024   GFRNONAA >60 02/27/2024   GFRAA 77 05/17/2021   CALCIUM 8.1 (L) 02/27/2024   PHOS 2.8 02/20/2024   PROT 4.6 (L) 02/23/2024   ALBUMIN 1.8 (L) 02/23/2024   LABGLOB 2.4 02/22/2024   BILITOT 0.4 02/23/2024   ALKPHOS 46 02/23/2024   AST 54  (  H) 02/23/2024   ALT 39 02/23/2024   ANIONGAP 11 02/27/2024    CBG (last 3)  Recent Labs    02/27/24 0610 02/27/24 0638 02/27/24 1213  GLUCAP 66* 78 280*      Coagulation Profile: No results for input(s): "INR", "PROTIME" in the last 168 hours.   Radiology Studies: No results found.      Kathlen Mody M.D. [name] 02/27/2024, 4:11 PM  Available via Epic secure chat 7am-7pm After 7 pm, please refer to night coverage provider listed on amion.

## 2024-02-27 NOTE — Progress Notes (Addendum)
 MD made aware of afib on cardiac monitor. HR in the 120s and sustaining. He is asymptomatic but has been in pain as PT/OT got him in the chair. Awaiting order.  1100 EKG ordered. Rate controlled, in the 90s.  1108 Rhythm converted to sinus.  1800 Patient's daughter c/o swelling on the left leg. RN palpated and felt the fibula no swelling noted, patient denied pain. MD notified, recommended xray. Awaiting order.

## 2024-02-28 ENCOUNTER — Encounter: Payer: Self-pay | Admitting: Cardiology

## 2024-02-28 ENCOUNTER — Encounter: Payer: Self-pay | Admitting: Sports Medicine

## 2024-02-28 ENCOUNTER — Telehealth: Payer: Self-pay | Admitting: Cardiology

## 2024-02-28 DIAGNOSIS — U071 COVID-19: Secondary | ICD-10-CM | POA: Diagnosis not present

## 2024-02-28 LAB — GLUCOSE, CAPILLARY
Glucose-Capillary: 72 mg/dL (ref 70–99)
Glucose-Capillary: 254 mg/dL — ABNORMAL HIGH (ref 70–99)
Glucose-Capillary: 288 mg/dL — ABNORMAL HIGH (ref 70–99)
Glucose-Capillary: 300 mg/dL — ABNORMAL HIGH (ref 70–99)

## 2024-02-28 MED ORDER — SACUBITRIL-VALSARTAN 24-26 MG PO TABS
1.0000 | ORAL_TABLET | Freq: Two times a day (BID) | ORAL | Status: DC
  Administered 2024-02-28 – 2024-02-29 (×3): 1 via ORAL
  Filled 2024-02-28 (×3): qty 1

## 2024-02-28 MED ORDER — INSULIN ASPART 100 UNIT/ML IJ SOLN
3.0000 [IU] | Freq: Three times a day (TID) | INTRAMUSCULAR | Status: DC
  Administered 2024-02-28: 3 [IU] via SUBCUTANEOUS

## 2024-02-28 NOTE — Plan of Care (Signed)
  Problem: Coping: Goal: Psychosocial and spiritual needs will be supported Outcome: Progressing   Problem: Respiratory: Goal: Will maintain a patent airway Outcome: Progressing   Problem: Tissue Perfusion: Goal: Adequacy of tissue perfusion will improve Outcome: Progressing

## 2024-02-28 NOTE — Progress Notes (Addendum)
 Brief Nutrition Note  New nutrition consult acknowledged for assessment of needs  Interventions in place for malnutrition  See full nutrition note with interventions outlined from 02/27/24  Spoke with pt's daughter at bedside who was concerned about protein intake for wound healing, discussed current interventions in place to increase protein intake and discussed adding 2 additional interventions. Per documentation, pt ate 60% of breakfast and 60% of lunch yesterday as well as drank Glucerna shake but did not eat dinner. Pt reports he ate 8pm snack that was sent after missing dinner. Confident pt is meeting protein needs for wound healing. Will continue to monitor pt as necessary   Interventions added 4/9: Magic cup TID with meals, each supplement provides 290 kcal and 9 grams of protein Mighty Shake TID with meals, each supplement provides 330 kcals and 9 grams of protein  Will follow up with pt as planned  Louis Meckel Dietetic Intern

## 2024-02-28 NOTE — Telephone Encounter (Signed)
 Daughter stated pt will be transferred to a rehab facility when d/c she will call back to schedule hfu.

## 2024-02-28 NOTE — TOC Progression Note (Signed)
 Transition of Care [facility]) - Progression Note    Patient Details  Name: Manraj Yeo MRN: [id number] Date of Birth: 21-Jul-1941  [facility] CM/SW Contact  Baldemar Lenis, [place] Phone Number: 02/28/2024, 2:18 PM  Clinical Narrative:   CSW following for discharge plan. CSW received update from daughter about medical concerns that have not been addressed, and relayed information to MD. Concerns with addressed with nutritionist, wound care, and MD, and current plan is to transition to [facility]. Authorization is approved through tomorrow and [facility] available. CSW provided update to daughter. CSW to follow.    Expected Discharge Plan: Skilled Nursing Facility Barriers to Discharge: Continued Medical Work up, English as a second language teacher  Expected Discharge Plan and Services     Post Acute Care Choice: Skilled Nursing Facility Living arrangements for the past 2 months: Single Family Home                                       Social Determinants of Health (SDOH) Interventions SDOH Screenings   Food Insecurity: No Food Insecurity (02/15/2024)  Housing: Low Risk  (02/15/2024)  Transportation Needs: No Transportation Needs (02/15/2024)  Utilities: Not At Risk (02/15/2024)  Depression (PHQ2-9): Low Risk  (10/03/2023)  Financial Resource Strain: Patient Declined (01/16/2024)  Physical Activity: Unknown (01/16/2024)  Social Connections: Moderately Integrated (01/16/2024)  Stress: Stress Concern Present (01/16/2024)  Tobacco Use: Low Risk  (02/14/2024)    Readmission Risk Interventions     No data to display

## 2024-02-28 NOTE — Care Management Important Message (Signed)
 Important Message  Patient Details  Name: Bilaal Leib MRN: [id number] Date of Birth: [birth year]   Important Message Given:  Yes - Medicare IM CORRECTION   Spoke with the patient daughter about the IM document she advised me to send an addl copy to the patient home address.      Amylee Lodato 02/28/2024, 8:46 AM

## 2024-02-28 NOTE — Progress Notes (Signed)
 PROGRESS NOTE    Earl [name]  BJY:[id number] DOB: Apr 11, 1941 DOA: 02/14/2024 PCP: [name], MD   Brief Narrative:  83 year old gentleman with history of type 2 diabetes, hypertension, hyperlipidemia, CHF, A-fib on Eliquis, history of stroke with residual right-sided weakness, MGUS, GERD, BPH, hypothyroidism brought to the ED by EMS due to worsening generalized weakness.  Patient seen in the ED workup concerning for COVID-19 infection.  CT head MRI brain negative for any acute abnormalities.  His mental status improved after stopping baclofen and started him on IV fluids.  He is more alert and working with PT and is close to discharge to SNF.  Assessment & Plan:   Principal Problem:   COVID-19 virus infection Active Problems:   Paroxysmal atrial fibrillation (HCC)   Essential hypertension   Hyperlipidemia   Non-insulin dependent type 2 diabetes mellitus (HCC)   BPH (benign prostatic hyperplasia)   Hypothyroidism   History of CVA (cerebrovascular accident)   Chronic anticoagulation   Diabetic 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 (HCC)   NICM (nonischemic cardiomyopathy) (HCC)   Chronic systolic CHF (congestive heart failure) (HCC)   Syncope, vasovagal   Malnutrition of moderate degree  Generalized Weakness/ Acute encephalopathy of unclear etiology :  In the setting of 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 ? From polyneuropathy from amyloidosis vs from COVID 19 infection.  On Baclofen which was stopped  on 4/3.  He is more alert and oriented.  Initial MRI of the brain is negative. Repeat CT head is negative for acute stroke.  Patient continuing to work with PT. he is fully alert and oriented.  Seen by PT OT and SNF is recommended which has been arranged for him however his daughter is not willing for him being discharged as of yet today due to several concerns as mentioned in my  chart/note today.   COVID-19 infection Patient presented with generalized weakness, lethargy.  No respiratory symptoms. COVID PCR positive, influenza A and B PCR are negative Chest x-ray is negative for pneumonia.  Treated supportively.  He is on room air.  Hyperkalemia S/p Lokelma.  Resolved.   Rhabdomyolysis: Resolved with IV fluids.  Last CK5 01 on 02/27/2024.   Chronic systolic CHF: History of a myeloid cardiomyopathy/ NICM Pt doesn't appear to be in Acute heart failure. Last echo showed LVEF of 45 to 50%. Left ventricle demonstrates global hypokinesis. LVEF diastolic parameters show grade II diastolic dysfunction. Torsemide, entresto are on hold for now in view of multiple episodes of syncope and hypotension.  Trial of aldactone last week, but patient became lethargic, hypotensive, and it was discontinued.  I will start him on Entresto again and see how it goes.   Insulin dependent diabetes mellitus type II:  A1c IS 8.4%.  Due to hypoglycemia of 78, his Lantus was reduced to 5 units, this morning he was again hypoglycemic with blood sugar as low as 70 but after that he is now 254.  Very labile, will not make any changes to regimen.    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 Last follow up with Dr Clelia Croft in 2023.  MM panel ordered.  Recommend outpatient follow up with oncology/ hematology outpatient. Ambulatory referral to hematology on discharge.    Hyperlipidemia Continue to hold statin for now since he just recovered from rhabdomyolysis, however will resume statin at discharge.   BPH Continue with proscar.    Vasovagal syncope in the setting  of autonomic neuropathy -Patient noted to have a vasovagal episode, 02/16/2024, while sitting on the bedside commode currently back to baseline. -MRI brain done on admission negative for any acute strokes. -2D echo done 08/2023 with EF of 45 to 50%, left ventricular global hypokinesis, grade 2 DD, no severe  valvular abnormalities. -BP noted to be soft on 02/16/2024 and assessed patient hydrated gently with IV fluids.   -BP improved.  -Patient noted to have another vasovagal episode of syncope while on the bedside commode having a large bowel movement per RN on 02/19/2024.   - BP parameters have improved and we continue to hold torsemide and aldactone but I am going to resume Entresto now and see how it goes.   PAF Continue with toprol for rate control. On Eliquis for anti coagulation.    History of stroke with residual right-sided weakness/hyperlipidemia -holding lipitor for elevated CK levels.  Continue with eliquis    Mild Pancytopenia ? From COVID INFECTION. Improving.   Hypothyroidism:  Continue Synthroid. TSH wnl.    DVT prophylaxis: Place TED hose Start: 02/22/24 1448   Code Status: Limited: Do not attempt resuscitation (DNR) -DNR-LIMITED -Do Not Intubate/DNI   Family Communication: Daughter present at bedside.  Plan of care discussed with patient in length and he/she verbalized understanding and agreed with it.  Status is: Inpatient Remains inpatient appropriate because: Patient medically stable and ready for discharge but daughter is not agreeable for discharge today and she is agreeable to discharge tomorrow.   Estimated body mass index is 22.18 kg/m as calculated from the following:   Height as of this encounter: 5\' 11"  (1.803 m).   Weight as of this encounter: 72.1 kg.  Pressure Injury 02/26/24 Coccyx Mid Unstageable - Full thickness tissue loss in which the base of the injury is covered by slough (yellow, tan, gray, green or brown) and/or eschar (tan, brown or black) in the wound bed. (Active)  02/26/24 0916  Location: Coccyx  Location Orientation: Mid  Staging: Unstageable - Full thickness tissue loss in which the base of the injury is covered by slough (yellow, tan, gray, green or brown) and/or eschar (tan, brown or black) in the wound bed.  Wound Description  (Comments):   Present on Admission: No  Dressing Type Foam - Lift dressing to assess site every shift 02/28/24 0327     Pressure Injury 02/26/24 Heel Right Deep Tissue Pressure Injury - Purple or maroon localized area of discolored intact skin or blood-filled blister due to damage of underlying soft tissue from pressure and/or shear. purple (Active)  02/26/24 1213  Location: Heel  Location Orientation: Right  Staging: Deep Tissue Pressure Injury - Purple or maroon localized area of discolored intact skin or blood-filled blister due to damage of underlying soft tissue from pressure and/or shear.  Wound Description (Comments): purple  Present on Admission:   Dressing Type Foam - Lift dressing to assess site every shift 02/28/24 0327     Pressure Injury 02/26/24 Toe (Comment  which one) Anterior;Right Deep Tissue Pressure Injury - Purple or maroon localized area of discolored intact skin or blood-filled blister due to damage of underlying soft tissue from pressure and/or shear. right gre (Active)  02/26/24 1215  Location: Toe (Comment  which one)  Location Orientation: Anterior;Right  Staging: Deep Tissue Pressure Injury - Purple or maroon localized area of discolored intact skin or blood-filled blister due to damage of underlying soft tissue from pressure and/or shear.  Wound Description (Comments): right great toe  Present  on Admission:   Dressing Type Foam - Lift dressing to assess site every shift 02/28/24 0327   Nutritional Assessment: Body mass index is 22.18 kg/m.[name] Seen by dietician.  I agree with the assessment and plan as outlined below: Nutrition Status: Nutrition Problem: Moderate Malnutrition Etiology: acute illness (COVID illness) Signs/Symptoms: mild fat depletion, mild muscle depletion Interventions: Liberalize Diet, Glucerna shake  . Skin Assessment: I have examined the patient's skin and I agree with the wound assessment as performed by the wound care RN as outlined  below: Pressure Injury 02/26/24 Coccyx Mid Unstageable - Full thickness tissue loss in which the base of the injury is covered by slough (yellow, tan, gray, green or brown) and/or eschar (tan, brown or black) in the wound bed. (Active)  02/26/24 0916  Location: Coccyx  Location Orientation: Mid  Staging: Unstageable - Full thickness tissue loss in which the base of the injury is covered by slough (yellow, tan, gray, green or brown) and/or eschar (tan, brown or black) in the wound bed.  Wound Description (Comments):   Present on Admission: No  Dressing Type Foam - Lift dressing to assess site every shift 02/28/24 0327     Pressure Injury 02/26/24 Heel Right Deep Tissue Pressure Injury - Purple or maroon localized area of discolored intact skin or blood-filled blister due to damage of underlying soft tissue from pressure and/or shear. purple (Active)  02/26/24 1213  Location: Heel  Location Orientation: Right  Staging: Deep Tissue Pressure Injury - Purple or maroon localized area of discolored intact skin or blood-filled blister due to damage of underlying soft tissue from pressure and/or shear.  Wound Description (Comments): purple  Present on Admission:   Dressing Type Foam - Lift dressing to assess site every shift 02/28/24 0327     Pressure Injury 02/26/24 Toe (Comment  which one) Anterior;Right Deep Tissue Pressure Injury - Purple or maroon localized area of discolored intact skin or blood-filled blister due to damage of underlying soft tissue from pressure and/or shear. right gre (Active)  02/26/24 1215  Location: Toe (Comment  which one)  Location Orientation: Anterior;Right  Staging: Deep Tissue Pressure Injury - Purple or maroon localized area of discolored intact skin or blood-filled blister due to damage of underlying soft tissue from pressure and/or shear.  Wound Description (Comments): right great toe  Present on Admission:   Dressing Type Foam - Lift dressing to assess site  every shift 02/28/24 0327    Consultants:  None  Procedures:  None  Antimicrobials:  Anti-infectives (From admission, onward)    None         Subjective: Patient seen and examined earlier with the nurse present at bedside.  Patient was fully alert and oriented and other than having sacral pain due to ulcer, he had no other complaint.  He was so much oriented that he was aware that insurance authorization is the only thing is pending and he would be discharging to SNF, he said so himself.  I was getting ready to discharge when I received a message from the social worker that the daughter has several concerns.  I spoke to her daughter Para over the phone, who mentioned that she was concerned about nutrition and she spoke to the physician/hospitalist yesterday and she wanted nutrition to draw the labs and check if there are any deficiencies and those to be addressed before patient can be discharged.  I informed her that patient has been seen by nutritionist and there are no labs that they  recommended, however even if they recommend any labs, labs often take several days to come back and those would not be the reason to hold discharge.  I then reached out to the nutrition person to speak directly to the daughter and addressed concerns.  Her second concern was that patient had atrial fibrillation last night.  Patient does have a history of PAF.  I informed the daughter that with PAF, patient can go in and out of atrial fibrillation and patient's rates were only 102 with no symptoms.  I informed her that as long as the rates are reasonably controlled less than 110 with no symptoms, there is nothing to worry about and currently his rates are stable and he is on correct medications.  Her third concern was the wound at the sacrum.  She wanted to know what the dimensions are.  I could not find those dimensions and wound care discharge.  Patient was last seen by wound care on 02/26/2024.  Daughter wanted  wound care to see patient daily.  She was informed that wound care typically does not see patient daily and nurses follow the recommendations about the wound care which are being correctly followed.  Regardless, we also reached out to the wound care to talk to the daughter and addressed questions.  The daughter also wanted me to come take a look at his left leg where she said " I can feel a bone".   I then went to see patient second time around 1 PM and was able to meet with the daughter face-to-face.  By that time, daughter confirmed that she has already talked to the nutritionist as well as wound care.  The bone/knee that she was showing me was normal knee where she could feel the bone because patient has less muscle and malnourished in that area.  I reassured her that this is normal knee/bone.  There was no open sore, no erythema, no tenderness or warmth.  After this, finally daughter was reassured and she would like for her father to stay in the hospital overnight and she is in agreement with discharging tomorrow to SNF.  I also updated TOC about this plan.  All in all, I spent approximately 70 minutes spending reviewing the chart prior to rounds, rounding on the patient seeing physically once, communicating with TOC, other paramedical staff, seeing in person to the patient second time and all the documentation.  Objective: Vitals:   02/27/24 1929 02/27/24 2307 02/28/24 0346 02/28/24 0843  BP: 113/70 123/68 108/61 103/60  Pulse: 80 80 74   Resp: 18     Temp: 98.3 F (36.8 C) 97.6 F (36.4 C)  97.7 F (36.5 C)  TempSrc: Oral Oral  Oral  SpO2: 98% 100% 98% 97%  Weight:      Height:        Intake/Output Summary (Last 24 hours) at 02/28/2024 1139 Last data filed at 02/28/2024 0327 Gross per 24 hour  Intake 790.53 ml  Output 1450 ml  Net -659.47 ml   Filed Weights   02/14/24 1952  Weight: 72.1 kg    Examination:  General exam: Appears calm and comfortable  Respiratory system: Clear  to auscultation. Respiratory effort normal. Cardiovascular system: S1 & S2 heard, RRR. No JVD, murmurs, rubs, gallops or clicks. No pedal edema. Gastrointestinal system: Abdomen is nondistended, soft and nontender. No organomegaly or masses felt. Normal bowel sounds heard. Central nervous system: Alert and oriented. No focal neurological deficits. Extremities: Symmetric 5 x 5 power.  Skin: No rashes, lesions or ulcers Psychiatry: Judgement and insight appear normal. Mood & affect appropriate.    Data Reviewed: I have personally reviewed following labs and imaging studies  CBC: Recent Labs  Lab 02/23/24 0613 02/27/24 0949  WBC 3.9* 8.9  NEUTROABS 2.8 6.5  HGB 11.7* 12.4*  HCT 34.0* 36.6*  MCV 85.4 86.3  PLT 169 339   Basic Metabolic Panel: Recent Labs  Lab 02/22/24 1047 02/23/24 0613 02/24/24 0948 02/27/24 0949  NA 137 140 138 139  K 4.4 4.2 4.1 4.1  CL 108 110 109 105  CO2 22 21* 20* 23  GLUCOSE 272* 118* 215* 125*  BUN 28* 25* 21 17  CREATININE 1.23 1.07 1.18 0.96  CALCIUM 7.7* 7.7* 7.6* 8.1*   GFR: Estimated Creatinine Clearance: 60.5 mL/min (by C-G formula based on SCr of 0.96 mg/dL). Liver Function Tests: Recent Labs  Lab 02/23/24 0613  AST 54*  ALT 39  ALKPHOS 46  BILITOT 0.4  PROT 4.6*  ALBUMIN 1.8*   No results for input(s): "LIPASE", "AMYLASE" in the last 168 hours. Recent Labs  Lab 02/22/24 1437  AMMONIA 15   Coagulation Profile: No results for input(s): "INR", "PROTIME" in the last 168 hours. Cardiac Enzymes: Recent Labs  Lab 02/22/24 1047 02/25/24 0728 02/27/24 0949  CKTOTAL 740* 1,152* 501*   BNP (last 3 results) No results for input(s): "PROBNP" in the last 8760 hours. HbA1C: No results for input(s): "HGBA1C" in the last 72 hours. CBG: Recent Labs  Lab 02/27/24 1213 02/27/24 1625 02/27/24 2112 02/28/24 0610 02/28/24 1118  GLUCAP 280* 209* 70 72 254*   Lipid Profile: No results for input(s): "CHOL", "HDL", "LDLCALC",  "TRIG", "CHOLHDL", "LDLDIRECT" in the last 72 hours. Thyroid Function Tests: No results for input(s): "TSH", "T4TOTAL", "FREET4", "T3FREE", "THYROIDAB" in the last 72 hours. Anemia Panel: No results for input(s): "VITAMINB12", "FOLATE", "FERRITIN", "TIBC", "IRON", "RETICCTPCT" in the last 72 hours. Sepsis Labs: No results for input(s): "PROCALCITON", "LATICACIDVEN" in the last 168 hours.  Recent Results (from the past 240 hours)  Culture, blood (Routine X 2) w Reflex to ID Panel     Status: None   Collection Time: 02/19/24  5:44 PM   Specimen: BLOOD  Result Value Ref Range Status   Specimen Description BLOOD SITE NOT SPECIFIED  Final   Special Requests   Final    BOTTLES DRAWN AEROBIC AND ANAEROBIC Blood Culture results may not be optimal due to an inadequate volume of blood received in culture bottles   Culture   Final    NO GROWTH 5 DAYS Performed at [facility], [address], [place], [place] [address]    Report Status 02/24/2024 FINAL  Final  Culture, blood (Routine X 2) w Reflex to ID Panel     Status: None   Collection Time: 02/19/24  5:44 PM   Specimen: BLOOD  Result Value Ref Range Status   Specimen Description BLOOD SITE NOT SPECIFIED  Final   Special Requests   Final    BOTTLES DRAWN AEROBIC AND ANAEROBIC Blood Culture results may not be optimal due to an inadequate volume of blood received in culture bottles   Culture   Final    NO GROWTH 5 DAYS Performed at [facility], [address], [place], [place] [address]    Report Status 02/24/2024 FINAL  Final  Urine Culture (for pregnant, neutropenic or urologic patients or patients with an indwelling urinary catheter)     Status: None  Collection Time: 02/19/24  6:40 PM   Specimen: Urine, Clean Catch  Result Value Ref Range Status   Specimen Description URINE, CLEAN CATCH  Final   Special Requests NONE  Final   Culture   Final    NO GROWTH Performed at [facility], [address]  [place], [place] [address]    Report Status 02/20/2024 FINAL  Final  Culture, blood (Routine X 2) w Reflex to ID Panel     Status: None   Collection Time: 02/21/24  2:44 PM   Specimen: BLOOD  Result Value Ref Range Status   Specimen Description BLOOD SITE NOT SPECIFIED  Final   Special Requests   Final    BOTTLES DRAWN AEROBIC AND ANAEROBIC Blood Culture adequate volume   Culture   Final    NO GROWTH 5 DAYS Performed at [facility], [address], [place], [place] [address]    Report Status 02/26/2024 FINAL  Final  Culture, blood (Routine X 2) w Reflex to ID Panel     Status: None   Collection Time: 02/21/24  2:44 PM   Specimen: BLOOD  Result Value Ref Range Status   Specimen Description BLOOD SITE NOT SPECIFIED  Final   Special Requests   Final    BOTTLES DRAWN AEROBIC AND ANAEROBIC Blood Culture adequate volume   Culture   Final    NO GROWTH 5 DAYS Performed at [facility], [address], [place], [place] [address]    Report Status 02/26/2024 FINAL  Final     Radiology Studies: No results found.  Scheduled Meds:  apixaban  5 mg Oral BID   benzonatate  100 mg Oral TID   cholecalciferol  1,000 Units Oral Daily   feeding supplement (GLUCERNA SHAKE)  237 mL Oral TID BM   ferrous sulfate  325 mg Oral QHS   finasteride  5 mg Oral Daily   insulin aspart  0-5 Units Subcutaneous QHS   insulin aspart  0-9 Units Subcutaneous TID WC   insulin aspart  3 Units Subcutaneous TID WC   insulin glargine-yfgn  5 Units Subcutaneous Daily   leptospermum manuka honey  1 Application Topical Daily   levothyroxine  25 mcg Oral Q0600   magnesium oxide  200 mg Oral Daily   metoprolol succinate  25 mg Oral QHS   senna-docusate  2 tablet Oral BID   Tafamidis  1 capsule Oral Daily   vitamin B-12  50 mcg Oral Daily   Continuous Infusions:   LOS: 12 days   Hughie Closs, MD [facility]  02/28/2024, 11:39 AM   *Please note that this is a verbal dictation therefore any  spelling or grammatical errors are due to the "[employer] One" system interpretation.  Please page via Amion and do not message via secure chat for urgent patient care matters. Secure chat can be used for non urgent patient care matters.  How to contact the [facility] Attending or Consulting provider 7A - 7P or covering provider during after hours 7P -7A, for this patient?  Check the care team in [facility] and look for a) attending/consulting [facility] provider listed and b) the [facility] team listed. Page or secure chat 7A-7P. Log into [email or web address] and use [place]'s universal password to access. If you do not have the password, please contact the hospital operator. Locate the [facility] provider you are looking for under [facility] and page to a number that you can be directly reached. If you  still have difficulty reaching the provider, please page the [facility] (Director on Call) for the Hospitalists listed on amion for assistance.

## 2024-02-28 NOTE — Progress Notes (Signed)
 Physical Therapy Treatment Patient Details Name: Earl [name] MRN: [id number] DOB: 04-12-1941 Today's Date: 02/28/2024   History of Present Illness Pt is 83 yo presenting to [facility] on 3/26 due to generalized weakness, L lean all day and fall. PMH includes PAF, cardiac amyloidosis, CHF, CVA, DMII, MGUS, HTN, FTT.    PT Comments  Patient progressing with sitting balance and less assist for sit to stand.  Able to stay standing though flexed for several trials due to soiled with BM and needing assist for hygiene.  Patient positioned as comfortably as possible while sitting and encouraged to call for assist back to bed once finished meal for help healing wound on his bottom.  Patient denied dizziness/light headedness today while wearing binder and knee hi compression socks.  PT will continue to follow.    If plan is discharge home, recommend the following: Assistance with cooking/housework;Assist for transportation;Help with stairs or ramp for entrance;A lot of help with bathing/dressing/bathroom;A lot of help with walking and/or transfers   Can travel by private vehicle     No  Equipment Recommendations  Other (comment) (TBA)    Recommendations for Other Services       Precautions / Restrictions Precautions Precautions: Fall Precaution/Restrictions Comments: watch BP, knee hi compression socks     Mobility  Bed Mobility Overal bed mobility: Needs Assistance Bed Mobility: Supine to Sit   Sidelying to sit: Mod assist, HOB elevated, Used rails       General bed mobility comments: assist for legs and trunk with pt encouraged to use rail    Transfers Overall transfer level: Needs assistance Equipment used: Ambulation equipment used Transfers: Sit to/from Stand, Bed to chair/wheelchair/BSC Sit to Stand: Min assist, From elevated surface, +2 physical assistance           General transfer comment: used Stedy and pt able to stand erect enough to place flaps on more narrow Stedy  to move to recliner.  Patient positioned for upright sitting to finish breakfast and to try to stay off sore bottom. Transfer via Lift Equipment: Stedy  Ambulation/Gait                   Stairs             Wheelchair Mobility     Tilt Bed    Modified Rankin (Stroke Patients Only)       Balance Overall balance assessment: Needs assistance Sitting-balance support: Feet supported Sitting balance-Leahy Scale: Poor Sitting balance - Comments: S for balance at EOB with UE support   Standing balance support: Bilateral upper extremity supported, Reliant on assistive device for balance Standing balance-Leahy Scale: Poor Standing balance comment: mod A +1 to +2 for balance in standing during hygiene efforts                            Communication    Cognition Arousal: Alert Behavior During Therapy: WFL for tasks assessed/performed   PT - Cognitive impairments: Problem solving                       PT - Cognition Comments: slow processing   Following commands impaired: Only follows one step commands consistently, Follows one step commands with increased time    Cueing    Exercises Other Exercises Other Exercises: multiple sit to squats with A for hygiene    General Comments General comments (skin integrity, edema, etc.): ABD binder and knee  hi compression socks on, pt denied symptoms; assist for hygiene due to BM during standing efforts      Pertinent Vitals/Pain Pain Assessment Faces Pain Scale: Hurts even more Pain Location: bottom with movement Pain Descriptors / Indicators: Grimacing, Guarding, Sore Pain Intervention(s): Monitored during session, Repositioned, Other (comment) (RN aware to help back to bed after breakfast)    Home Living                          Prior Function            PT Goals (current goals can now be found in the care plan section) Progress towards PT goals: Progressing toward goals     Frequency    Min 2X/week      PT Plan      Co-evaluation              AM-PAC PT "6 Clicks" Mobility   Outcome Measure  Help needed turning from your back to your side while in a flat bed without using bedrails?: A Lot Help needed moving from lying on your back to sitting on the side of a flat bed without using bedrails?: A Lot Help needed moving to and from a bed to a chair (including a wheelchair)?: Total Help needed standing up from a chair using your arms (e.g., wheelchair or bedside chair)?: Total Help needed to walk in hospital room?: Total Help needed climbing 3-5 steps with a railing? : Total 6 Click Score: 8    End of Session Equipment Utilized During Treatment: Gait belt Activity Tolerance: Patient limited by pain;Patient limited by fatigue Patient left: in chair;with call bell/phone within reach Nurse Communication: Need for lift equipment PT Visit Diagnosis: Other abnormalities of gait and mobility (R26.89);Pain Pain - part of body:  (bottom)     Time: 1610-9604 PT Time Calculation (min) (ACUTE ONLY): 30 min  Charges:    $Therapeutic Activity: 23-37 mins PT General Charges $$ ACUTE PT VISIT: 1 Visit                     Sheran Lawless, PT Acute Rehabilitation Services Office:[phone] 02/28/2024    Earl [name] 02/28/2024, 3:43 PM

## 2024-02-28 NOTE — Consult Note (Addendum)
 WOC nurse requested to speak to patients daughter regarding coccyx wound.  I called Earl [name] and we discussed the dimensions of this wound (as of 02/26/2024 7.5 cm x 9 cm per nursing flowsheet, depth obscured by necrotic tissue) and the fact that Medihoney has been ordered to help remove necrotic tissue.  We also discussed the importance of keeping pressure off area and adequate nutrition with protein for wound healing. Daughter expressed concern that patient is in a lot of pain from this wound and this may hinder his progress at rehab.  I did recommend a low air loss mattress at facility and use of pressure redistribution cushion when up in chair. Patient should continue to receive daily wound care at facility. We discussed pressure relief, keeping area clean and dry, adequate nutrition and wound care to remove necrotic tissue as mainstays of treatment.     Thank you,    Priscella Mann MSN, RN-BC, [employer] [phone]

## 2024-02-29 ENCOUNTER — Other Ambulatory Visit: Payer: Self-pay

## 2024-02-29 DIAGNOSIS — U071 COVID-19: Secondary | ICD-10-CM | POA: Diagnosis not present

## 2024-02-29 LAB — GLUCOSE, CAPILLARY: Glucose-Capillary: 120 mg/dL — ABNORMAL HIGH (ref 70–99)

## 2024-02-29 MED ORDER — METOPROLOL SUCCINATE ER 25 MG PO TB24: 25.0000 mg | ORAL_TABLET | Freq: Every day | ORAL | 0 refills | Status: AC

## 2024-02-29 NOTE — Discharge Summary (Signed)
 Physician Discharge Summary  Earl [name] ONG:[id number] DOB: Apr 19, 1941 DOA: 02/14/2024  PCP: [name], MD  Admit date: 02/14/2024 Discharge date: 02/29/2024 30 Day Unplanned Readmission Risk Score    Flowsheet Row ED to Hosp-Admission (Current) from 02/14/2024 in [facility]  30 Day Unplanned Readmission Risk Score (%) 19.6 Filed at 02/29/2024 0400       This score is the patient's risk of an unplanned readmission within 30 days of being discharged (0 -100%). The score is based on dignosis, age, lab data, medications, orders, and past utilization.   Low:  0-14.9   Medium: 15-21.9   High: 22-29.9   Extreme: 30 and above          Admitted From: Home Disposition: SNF  Recommendations for Outpatient Follow-up:  Follow up with PCP in 1-2 weeks Please obtain BMP/CBC in one week Follow-up with your primary cardiologist in 1 week Please follow up with your PCP on the following pending results: Unresulted Labs (From admission, onward)    None         Home Health: None Equipment/Devices: None  Discharge Condition: Stable CODE STATUS: DNR Diet recommendation: Cardiac/low-sodium  Subjective: Seen and examined.  No complaint other than sacral wound pain.  Daughter at the bedside.  Plan of discharge discussed with them and they are both in agreement.  Patient is fully alert and oriented.  Brief/Interim Summary: 83 year old gentleman with history of type 2 diabetes, hypertension, hyperlipidemia, CHF, A-fib on Eliquis, history of stroke with residual right-sided weakness, MGUS, GERD, BPH, hypothyroidism brought to the ED by EMS due to worsening generalized weakness.  Patient seen in the ED workup concerning for COVID-19 infection.  CT head MRI brain negative for any acute abnormalities.  His mental status improved after stopping baclofen and started him on IV fluids.    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 ? From polyneuropathy from amyloidosis vs from COVID 19 infection.  On Baclofen which was stopped  on 4/3.  He is more alert and oriented.  Initial MRI of the brain is negative. Repeat CT head is negative for acute stroke.  Seen by PT OT and SNF is recommended and patient is stable for discharge to SNF today.  Daughter in agreement.  We are discontinuing baclofen at discharge to prevent encephalopathy again.   COVID-19 infection Patient presented with generalized weakness, lethargy.  No respiratory symptoms. COVID PCR positive, influenza A and B PCR are negative Chest x-ray is negative for pneumonia.  Treated supportively.  He is on room air.   Hyperkalemia S/p Lokelma.  Resolved.   Rhabdomyolysis: Resolved with IV fluids.  Last CK 501 on 02/27/2024.   Chronic systolic CHF: History of a myeloid cardiomyopathy/ NICM Pt doesn't appear to be in Acute heart failure. Last echo showed LVEF of 45 to 50%. Left ventricle demonstrates global hypokinesis. LVEF diastolic parameters show grade II diastolic dysfunction. Torsemide, entresto are on hold for now in view of multiple episodes of syncope and hypotension.  Trial of aldactone last week, but patient became lethargic, hypotensive, and it was discontinued.  I started him on Entresto on 02/28/2024, no reports of hypotension or syncope.  Based on this assessment, I am resuming only Entresto but holding rest of the medications which is Aldactone and torsemide.  I have discussed this in length with daughter and recommended follow-up with cardiology closely within a week or 2.  She is in agreement.   Insulin dependent diabetes mellitus type II:  A1c IS 8.4%.  Due to hypoglycemia of 78, his Lantus was reduced to 5 units, this morning he was again hypoglycemic with blood sugar as low as 70 but after that he is now 254.  Blood sugar very labile.  Now that  he is going back and resuming his metformin, I will discontinue Lantus.    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 Last follow up with Dr Clelia Croft in 2023.  MM panel ordered.  Recommend outpatient follow up with oncology/ hematology outpatient. Ambulatory referral to hematology on discharge.    Hyperlipidemia Statin was hold during hospitalization for rhabdomyolysis which resolved 2 days ago so now we are resuming statin at discharge.   BPH Continue with proscar.    Vasovagal syncope in the setting of autonomic neuropathy -Patient noted to have a vasovagal episode, 02/16/2024, while sitting on the bedside commode currently back to baseline. -MRI brain done on admission negative for any acute strokes. -2D echo done 08/2023 with EF of 45 to 50%, left ventricular global hypokinesis, grade 2 DD, no severe valvular abnormalities. -BP noted to be soft on 02/16/2024 and assessed patient hydrated gently with IV fluids.   -BP improved.  -Patient noted to have another vasovagal episode of syncope while on the bedside commode having a large bowel movement per RN on 02/19/2024.   - BP parameters have improved and we continue to hold torsemide and aldactone but resuming Entresto as mentioned above.   PAF Continue with toprol for rate control. On Eliquis for anti coagulation.    History of stroke with residual right-sided weakness/hyperlipidemia Resume statin and Eliquis.   Mild Pancytopenia ? From COVID INFECTION. Improving.    Hypothyroidism:  Continue Synthroid. TSH wnl.    Discharge plan was discussed with patient and/or family member and they verbalized understanding and agreed with it.  Discharge Diagnoses:  Principal Problem:   COVID-19 virus infection Active Problems:   Paroxysmal atrial fibrillation (HCC)   Essential hypertension   Hyperlipidemia   Non-insulin dependent type 2 diabetes mellitus (HCC)   BPH (benign prostatic hyperplasia)    Hypothyroidism   History of CVA (cerebrovascular accident)   Chronic anticoagulation   Diabetic 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 (HCC)   NICM (nonischemic cardiomyopathy) (HCC)   Chronic systolic CHF (congestive heart failure) (HCC)   Syncope, vasovagal   Malnutrition of moderate degree    Discharge Instructions   Allergies as of 02/29/2024       Reactions   Penicillins Other (See Comments)   Did it involve swelling of the face/tongue/throat, SOB, or low BP? Unknown Did it involve sudden or severe rash/hives, skin peeling, or any reaction on the inside of your mouth or nose? Unknown Did you need to seek medical attention at a hospital or doctor's office? No When did it last happen?    Over 28 Years Ago   If all above answers are "NO", may proceed with cephalosporin use.        Medication List     STOP taking these medications    spironolactone 25 MG tablet Commonly known as: ALDACTONE   torsemide 20 MG tablet Commonly known as: DEMADEX       TAKE these medications    Amvuttra 25 MG/0.5ML syringe Generic drug: vutrisiran sodium INJECT 25MG  (0.5ML) SUBCUTANEOUSLY ONCE EVERY 3 MONTHS. ROTATE ADMINISTRATION SITE WITH EACH INJECTION. *REFRIGERATE. DO NOT  SHAKE.*   Aquaphor Adv Protect Healing Oint Apply small amount to two areas on buttocks daily to prevent breakdown   ascorbic acid 500 MG tablet Commonly known as: VITAMIN C Take 500 mg by mouth daily.   atorvastatin 40 MG tablet Commonly known as: LIPITOR Take  1 tablet  Daily for Cholesterol                                                       /                                  TAKE                                                       BY                                           MOUTH   B-12 50 MCG Tabs Take 50 mcg by mouth daily.   Baclofen 5 MG Tabs Take 0.5 tablets (2.5 mg total) by mouth 2 (two) times daily.   clotrimazole-betamethasone cream Commonly known as: Lotrisone Apply  to affected area 2 to 4 x / day   Eliquis 5 MG Tabs tablet Generic drug: apixaban TAKE 1 TABLET BY MOUTH TWICE  DAILY TO PREVENT BLOOD CLOTS   Entresto 24-26 MG Generic drug: sacubitril-valsartan Take 1 tablet by mouth 2 (two) times daily.   ferrous sulfate 325 (65 FE) MG tablet Take 325 mg by mouth at bedtime.   finasteride 5 MG tablet Commonly known as: PROSCAR TAKE 1 TABLET BY MOUTH DAILY FOR PROSTATE   Klor-Con M20 20 MEQ tablet Generic drug: potassium chloride SA Take 1 tablet by mouth once daily   levothyroxine 50 MCG tablet Commonly known as: SYNTHROID TAKE 1 TABLET BY MOUTH DAILY ON  AN EMPTY STOMACH WITH ONLY WATER FOR 30 MINUTES AND NO ANTACID,  MEDS, CALCIUM OR MAGNESIUM FOR 4 HOURS AND AVOID BIOTIN What changed:  how much to take how to take this when to take this additional instructions   Magnesium 250 MG Tabs Take 250 mg by mouth daily.   metFORMIN 500 MG 24 hr tablet Commonly known as: GLUCOPHAGE-XR TAKE 2 TABLETS BY MOUTH TWICE  DAILY WITH MEALS FOR DIABETES   metoprolol succinate 25 MG 24 hr tablet Commonly known as: TOPROL-XL Take 1 tablet (25 mg total) by mouth at bedtime. What changed:  medication strength how much to take additional instructions   multivitamin capsule Take 1 capsule by mouth daily.   NONFORMULARY OR COMPOUNDED ITEM Please dispense a Rolling Walker.  Dx:R26.81 & G63   ONE TOUCH SURESOFT Misc Check blood sugar 1 time a day-DX-E11.91   ONE TOUCH ULTRA 2 w/Device Kit Check blood sugar 1 time a day-DX-E11.9   OneTouch Delica Plus Lancet30G Misc CHECK BLOOD SUGAR ONCE A DAY.   OneTouch Ultra test strip Generic drug: glucose blood Use as instructed   trolamine salicylate 10 % cream Commonly known  as: ASPERCREME Apply 1 application topically daily as needed for muscle pain.   VITAMIN A PO Take by mouth. 3000 mg   VITAMIN D PO Take 1 tablet by mouth daily.   Vyndamax 61 MG Caps Generic drug: Tafamidis Take 1  capsule by mouth daily.        Follow-up Information     Laurey Morale, MD Follow up in 1 week(s).   Specialty: Cardiology Why: Dr. Alford [place] office will call you to arrange follow-up appointment. Contact information: [address] [place] [address] [phone]         [name], MD Follow up in 1 week(s).   Specialty: Internal Medicine Contact information: [address] [place] [address] [phone]                Allergies  Allergen Reactions   Penicillins Other (See Comments)    Did it involve swelling of the face/tongue/throat, SOB, or low BP? Unknown Did it involve sudden or severe rash/hives, skin peeling, or any reaction on the inside of your mouth or nose? Unknown Did you need to seek medical attention at a hospital or doctor's office? No When did it last happen?    Over 5 Years Ago   If all above answers are "NO", may proceed with cephalosporin use.      Consultations: None   Procedures/Studies: CT HEAD WO CONTRAST ( ) Result Date: 02/22/2024 CLINICAL DATA:  Altered mental status EXAM: CT HEAD WITHOUT CONTRAST TECHNIQUE: Contiguous axial images were obtained from the base of the skull through the vertex without intravenous contrast. RADIATION DOSE REDUCTION: This exam was performed according to the departmental dose-optimization program which includes automated exposure control, adjustment of the mA and/or kV according to patient size and/or use of iterative reconstruction technique. COMPARISON:  02/14/2024 FINDINGS: Brain: Of old right cerebellar infarct. Vascular: No hyperdense vessel or unexpected vascular calcification. Skull: The visualized skull base, calvarium and extracranial soft tissues are normal. Sinuses/Orbits: No fluid levels or advanced mucosal thickening of the visualized paranasal sinuses. No mastoid or middle ear effusion. Normal orbits. Other: None. IMPRESSION: 1. No acute intracranial  abnormality. 2. Old right cerebellar infarct. Electronically Signed   By: Deatra Robinson M.D.   On: 02/22/2024 19:09   DG CHEST PORT 1 VIEW Result Date: 02/21/2024 CLINICAL DATA:  Fever, COVID positive 02/15/2024 EXAM: PORTABLE CHEST 1 VIEW COMPARISON:  02/19/2024 FINDINGS: Single frontal view of the chest demonstrates a stable cardiac silhouette. No acute airspace disease, effusion, or pneumothorax. No acute bony abnormalities. IMPRESSION: 1. No acute intrathoracic process. Electronically Signed   By: Sharlet Salina M.D.   On: 02/21/2024 17:51   DG CHEST PORT 1 VIEW Result Date: 02/19/2024 CLINICAL DATA:  [id number] Fever [id number] EXAM: PORTABLE CHEST 1 VIEW COMPARISON:  Chest x-ray 02/14/2024 FINDINGS: The heart and mediastinal contours are unchanged. Atherosclerotic plaque. No focal consolidation. No pulmonary edema. No pleural effusion. No pneumothorax. No acute osseous abnormality. IMPRESSION: No active disease. Electronically Signed   By: Tish Frederickson M.D.   On: 02/19/2024 20:04   MR BRAIN WO CONTRAST Result Date: 02/15/2024 CLINICAL DATA:  difficulty speaking, leaning to right EXAM: MRI HEAD WITHOUT CONTRAST TECHNIQUE: Multiplanar, multiecho pulse sequences of the brain and surrounding structures were obtained without intravenous contrast. COMPARISON:  Same day CT head. FINDINGS: Brain: No acute infarction, hemorrhage, hydrocephalus, extra-axial collection or mass lesion. Remote right cerebellar infarct. Vascular: Major arterial flow voids are maintained at the skull base. Skull and upper  cervical spine: Normal marrow signal. Sinuses/Orbits: Negative. Other: No mastoid effusions. IMPRESSION: 1. No evidence of acute intracranial abnormality. 2. Remote right cerebellar infarct. Electronically Signed   By: Feliberto Harts M.D.   On: 02/15/2024 01:18   DG Chest Portable 1 View Result Date: 02/14/2024 CLINICAL DATA:  Fever and weakness EXAM: PORTABLE CHEST 1 VIEW COMPARISON:  07/23/2021 FINDINGS: Heart  size and pulmonary vascularity are normal. Lungs are clear. No pleural effusion or pneumothorax. Mediastinal contours appear intact. IMPRESSION: No active disease. Electronically Signed   By: Burman Nieves M.D.   On: 02/14/2024 21:46   CT Head Wo Contrast Result Date: 02/14/2024 CLINICAL DATA:  Neuro deficit, acute, stroke suspected. Leaning to left side all day with overall weakness. Fall today. EXAM: CT HEAD WITHOUT CONTRAST TECHNIQUE: Contiguous axial images were obtained from the base of the skull through the vertex without intravenous contrast. RADIATION DOSE REDUCTION: This exam was performed according to the departmental dose-optimization program which includes automated exposure control, adjustment of the mA and/or kV according to patient size and/or use of iterative reconstruction technique. COMPARISON:  11/14/2023. FINDINGS: Brain: No acute intracranial hemorrhage, midline shift or mass effect. No extra-axial fluid collection. Mild periventricular white matter hypodensities are present bilaterally. There is a encephalomalacia in the right cerebellar hemisphere. No hydrocephalus is seen. Vascular: No hyperdense vessel or unexpected calcification. Skull: Normal. Negative for fracture or focal lesion. Sinuses/Orbits: Mucosal thickening is noted in the ethmoid air cells bilaterally. There is calcification in the region of the optic nerves in the posterior lobes, compatible with optic drusen. No acute orbital abnormality. Other: None. IMPRESSION: 1. No acute intracranial process. 2. Atrophy with chronic microvascular ischemic changes. 3. Chronic right cerebellar infarct. Electronically Signed   By: Thornell Sartorius M.D.   On: 02/14/2024 21:41     Discharge Exam: Vitals:   02/29/24 0454 02/29/24 0732  BP: 122/77 130/70  Pulse: 94 88  Resp: 19 17  Temp: 98.2 F (36.8 C) 98.1 F (36.7 C)  SpO2: 97% 96%   Vitals:   02/28/24 1947 02/29/24 0009 02/29/24 0454 02/29/24 0732  BP: (!) 122/51 (!) 98/53  122/77 130/70  Pulse: 93 78 94 88  Resp: (!) 23 14 19 17   Temp: 99.4 F (37.4 C) 99.3 F (37.4 C) 98.2 F (36.8 C) 98.1 F (36.7 C)  TempSrc: Axillary Oral Oral Oral  SpO2: 96% 98% 97% 96%  Weight:      Height:        General: Pt is alert, awake, not in acute distress Cardiovascular: RRR, S1/S2 +, no rubs, no gallops Respiratory: CTA bilaterally, no wheezing, no rhonchi Abdominal: Soft, NT, ND, bowel sounds + Extremities: no edema, no cyanosis    The results of significant diagnostics from this hospitalization (including imaging, microbiology, ancillary and laboratory) are listed below for reference.     Microbiology: Recent Results (from the past 240 hours)  Culture, blood (Routine X 2) w Reflex to ID Panel     Status: None   Collection Time: 02/19/24  5:44 PM   Specimen: BLOOD  Result Value Ref Range Status   Specimen Description BLOOD SITE NOT SPECIFIED  Final   Special Requests   Final    BOTTLES DRAWN AEROBIC AND ANAEROBIC Blood Culture results may not be optimal due to an inadequate volume of blood received in culture bottles   Culture   Final    NO GROWTH 5 DAYS Performed at [facility], [address], [place], [place] [address]  Report Status 02/24/2024 FINAL  Final  Culture, blood (Routine X 2) w Reflex to ID Panel     Status: None   Collection Time: 02/19/24  5:44 PM   Specimen: BLOOD  Result Value Ref Range Status   Specimen Description BLOOD SITE NOT SPECIFIED  Final   Special Requests   Final    BOTTLES DRAWN AEROBIC AND ANAEROBIC Blood Culture results may not be optimal due to an inadequate volume of blood received in culture bottles   Culture   Final    NO GROWTH 5 DAYS Performed at [facility], [address], [place], [place] [address]    Report Status 02/24/2024 FINAL  Final  Urine Culture (for pregnant, neutropenic or urologic patients or patients with an indwelling urinary catheter)     Status: None   Collection Time: 02/19/24   6:40 PM   Specimen: Urine, Clean Catch  Result Value Ref Range Status   Specimen Description URINE, CLEAN CATCH  Final   Special Requests NONE  Final   Culture   Final    NO GROWTH Performed at [facility], [address], [place], [place] [address]    Report Status 02/20/2024 FINAL  Final  Culture, blood (Routine X 2) w Reflex to ID Panel     Status: None   Collection Time: 02/21/24  2:44 PM   Specimen: BLOOD  Result Value Ref Range Status   Specimen Description BLOOD SITE NOT SPECIFIED  Final   Special Requests   Final    BOTTLES DRAWN AEROBIC AND ANAEROBIC Blood Culture adequate volume   Culture   Final    NO GROWTH 5 DAYS Performed at [facility], [address] [place], [place] [address]    Report Status 02/26/2024 FINAL  Final  Culture, blood (Routine X 2) w Reflex to ID Panel     Status: None   Collection Time: 02/21/24  2:44 PM   Specimen: BLOOD  Result Value Ref Range Status   Specimen Description BLOOD SITE NOT SPECIFIED  Final   Special Requests   Final    BOTTLES DRAWN AEROBIC AND ANAEROBIC Blood Culture adequate volume   Culture   Final    NO GROWTH 5 DAYS Performed at [facility], [address], [place], [place] [address]    Report Status 02/26/2024 FINAL  Final     Labs: BNP (last 3 results) Recent Labs    08/23/23 1541  BNP 99.2   Basic Metabolic Panel: Recent Labs  Lab 02/22/24 1047 02/23/24 0613 02/24/24 0948 02/27/24 0949  NA 137 140 138 139  K 4.4 4.2 4.1 4.1  CL 108 110 109 105  CO2 22 21* 20* 23  GLUCOSE 272* 118* 215* 125*  BUN 28* 25* 21 17  CREATININE 1.23 1.07 1.18 0.96  CALCIUM 7.7* 7.7* 7.6* 8.1*   Liver Function Tests: Recent Labs  Lab 02/23/24 0613  AST 54*  ALT 39  ALKPHOS 46  BILITOT 0.4  PROT 4.6*  ALBUMIN 1.8*   No results for input(s): "LIPASE", "AMYLASE" in the last 168 hours. Recent Labs  Lab 02/22/24 1437  AMMONIA 15   CBC: Recent Labs  Lab 02/23/24 0613 02/27/24 0949  WBC  3.9* 8.9  NEUTROABS 2.8 6.5  HGB 11.7* 12.4*  HCT 34.0* 36.6*  MCV 85.4 86.3  PLT 169 339   Cardiac Enzymes: Recent Labs  Lab 02/22/24 1047 02/25/24 0728 02/27/24 0949  CKTOTAL 740* 1,152* 501*  BNP: Invalid input(s): "POCBNP" CBG: Recent Labs  Lab 02/28/24 0610 02/28/24 1118 02/28/24 1723 02/28/24 2114 02/29/24 0637  GLUCAP 72 254* 288* 300* 120*   D-Dimer No results for input(s): "DDIMER" in the last 72 hours. Hgb A1c No results for input(s): "HGBA1C" in the last 72 hours. Lipid Profile No results for input(s): "CHOL", "HDL", "LDLCALC", "TRIG", "CHOLHDL", "LDLDIRECT" in the last 72 hours. Thyroid function studies No results for input(s): "TSH", "T4TOTAL", "T3FREE", "THYROIDAB" in the last 72 hours.  Invalid input(s): "FREET3" Anemia work up No results for input(s): "VITAMINB12", "FOLATE", "FERRITIN", "TIBC", "IRON", "RETICCTPCT" in the last 72 hours. Urinalysis    Component Value Date/Time   COLORURINE YELLOW 02/19/2024 1840   APPEARANCEUR CLEAR 02/19/2024 1840   LABSPEC 1.013 02/19/2024 1840   PHURINE 5.0 02/19/2024 1840   GLUCOSEU 50 (A) 02/19/2024 1840   HGBUR SMALL (A) 02/19/2024 1840   BILIRUBINUR NEGATIVE 02/19/2024 1840   KETONESUR NEGATIVE 02/19/2024 1840   PROTEINUR 100 (A) 02/19/2024 1840   UROBILINOGEN 0.2 05/05/2015 1443   NITRITE NEGATIVE 02/19/2024 1840   LEUKOCYTESUR NEGATIVE 02/19/2024 1840   Sepsis Labs Recent Labs  Lab 02/23/24 0613 02/27/24 0949  WBC 3.9* 8.9   Microbiology Recent Results (from the past 240 hours)  Culture, blood (Routine X 2) w Reflex to ID Panel     Status: None   Collection Time: 02/19/24  5:44 PM   Specimen: BLOOD  Result Value Ref Range Status   Specimen Description BLOOD SITE NOT SPECIFIED  Final   Special Requests   Final    BOTTLES DRAWN AEROBIC AND ANAEROBIC Blood Culture results may not be optimal due to an inadequate volume of blood received in culture bottles   Culture   Final    NO GROWTH 5  DAYS Performed at [facility], [address] [place], [place] [address]    Report Status 02/24/2024 FINAL  Final  Culture, blood (Routine X 2) w Reflex to ID Panel     Status: None   Collection Time: 02/19/24  5:44 PM   Specimen: BLOOD  Result Value Ref Range Status   Specimen Description BLOOD SITE NOT SPECIFIED  Final   Special Requests   Final    BOTTLES DRAWN AEROBIC AND ANAEROBIC Blood Culture results may not be optimal due to an inadequate volume of blood received in culture bottles   Culture   Final    NO GROWTH 5 DAYS Performed at [facility], [address] [place], [place] [address]    Report Status 02/24/2024 FINAL  Final  Urine Culture (for pregnant, neutropenic or urologic patients or patients with an indwelling urinary catheter)     Status: None   Collection Time: 02/19/24  6:40 PM   Specimen: Urine, Clean Catch  Result Value Ref Range Status   Specimen Description URINE, CLEAN CATCH  Final   Special Requests NONE  Final   Culture   Final    NO GROWTH Performed at [facility], [address] [place], [place] [address]    Report Status 02/20/2024 FINAL  Final  Culture, blood (Routine X 2) w Reflex to ID Panel     Status: None   Collection Time: 02/21/24  2:44 PM   Specimen: BLOOD  Result Value Ref Range Status   Specimen Description BLOOD SITE NOT SPECIFIED  Final   Special Requests   Final    BOTTLES DRAWN AEROBIC AND ANAEROBIC Blood Culture adequate volume   Culture  Final    NO GROWTH 5 DAYS Performed at [facility], [address], [place], [place] [address]    Report Status 02/26/2024 FINAL  Final  Culture, blood (Routine X 2) w Reflex to ID Panel     Status: None   Collection Time: 02/21/24  2:44 PM   Specimen: BLOOD  Result Value Ref Range Status   Specimen Description BLOOD SITE NOT SPECIFIED  Final   Special Requests   Final    BOTTLES DRAWN AEROBIC AND ANAEROBIC Blood Culture adequate volume   Culture   Final    NO  GROWTH 5 DAYS Performed at [facility], [address] [place], [place] [address]    Report Status 02/26/2024 FINAL  Final    FURTHER DISCHARGE INSTRUCTIONS:   Get Medicines reviewed and adjusted: Please take all your medications with you for your next visit with your Primary MD   Laboratory/radiological data: Please request your Primary MD to go over all hospital tests and procedure/radiological results at the follow up, please ask your Primary MD to get all Hospital records sent to his/her office.   In some cases, they will be blood work, cultures and biopsy results pending at the time of your discharge. Please request that your primary care M.D. goes through all the records of your hospital data and follows up on these results.   Also Note the following: If you experience worsening of your admission symptoms, develop shortness of breath, life threatening emergency, suicidal or homicidal thoughts you must seek medical attention immediately by calling 911 or calling your MD immediately  if symptoms less severe.   You must read complete instructions/literature along with all the possible adverse reactions/side effects for all the Medicines you take and that have been prescribed to you. Take any new Medicines after you have completely understood and accpet all the possible adverse reactions/side effects.    Do not drive when taking Pain medications or sleeping medications (Benzodaizepines)   Do not take more than prescribed Pain, Sleep and Anxiety Medications. It is not advisable to combine anxiety,sleep and pain medications without talking with your primary care practitioner   Special Instructions: If you have smoked or chewed Tobacco  in the last 2 yrs please stop smoking, stop any regular Alcohol  and or any Recreational drug use.   Wear Seat belts while driving.   Please note: You were cared for by a hospitalist during your hospital stay. Once you are discharged, your primary  care physician will handle any further medical issues. Please note that NO REFILLS for any discharge medications will be authorized once you are discharged, as it is imperative that you return to your primary care physician (or establish a relationship with a primary care physician if you do not have one) for your post hospital discharge needs so that they can reassess your need for medications and monitor your lab values  Time coordinating discharge: Over 30 minutes  SIGNED:   Hughie Closs, MD  [facility] 02/29/2024, 7:51 AM *Please note that this is a verbal dictation therefore any spelling or grammatical errors are due to the "[employer] One" system interpretation. If 7PM-7AM, please contact night-coverage [email or web address]

## 2024-02-29 NOTE — Telephone Encounter (Signed)
 Separate encounter reports pt is in facility daughter would like to call back to schedule at later date

## 2024-02-29 NOTE — TOC Transition Note (Signed)
 Transition of Care [facility]) - Discharge Note   Patient Details  Name: Earl [name] MRN: [id number] Date of Birth: 1941-10-07  [facility] CM/SW Contact:  Charidy Cappelletti Felipa Emory, Student-Social Work Phone Number: 02/29/2024, 11:30 AM   Clinical Narrative:   MSW Student received insurance approval for patient to admit to [facility]. MSW Student confirmed with MD that patient is stable for discharge. MSW Student notified daughter [name] and they are in agreement with discharge. MSW Student confirmed bed is available at SNF. Transport arranged with [facility] for next available.   Number to call report [phone] RM: [phone]     Final next level of care: Skilled Nursing Facility Barriers to Discharge: Barriers Resolved   Patient Goals and CMS Choice Patient states their goals for this hospitalization and ongoing recovery are:: To get better CMS Medicare.gov Compare Post Acute Care list provided to:: Patient Choice offered to / list presented to : Patient [place] ownership interest in [facility].provided to:: Patient    Discharge Placement              Patient chooses bed at: [facility] Patient to be transferred to facility by: [facility] Name of family member notified: Para Patient and family notified of of transfer: 02/29/24  Discharge Plan and Services Additional resources added to the After Visit Summary for       Post Acute Care Choice: Skilled Nursing Facility                               Social Drivers of Health (SDOH) Interventions SDOH Screenings   Food Insecurity: No Food Insecurity (02/15/2024)  Housing: Low Risk  (02/15/2024)  Transportation Needs: No Transportation Needs (02/15/2024)  Utilities: Not At Risk (02/15/2024)  Depression (PHQ2-9): Low Risk  (10/03/2023)  Financial Resource Strain: Patient Declined (01/16/2024)  Physical Activity: Unknown (01/16/2024)  Social Connections: Moderately Integrated (01/16/2024)  Stress: Stress Concern  Present (01/16/2024)  Tobacco Use: Low Risk  (02/14/2024)     Readmission Risk Interventions     No data to display

## 2024-03-03 ENCOUNTER — Encounter: Payer: Self-pay | Admitting: Cardiology

## 2024-03-04 ENCOUNTER — Emergency Department (HOSPITAL_COMMUNITY)

## 2024-03-04 ENCOUNTER — Other Ambulatory Visit: Payer: Self-pay

## 2024-03-04 ENCOUNTER — Inpatient Hospital Stay (HOSPITAL_COMMUNITY)
Admission: EM | Admit: 2024-03-04 | Discharge: 2024-09-21 | DRG: 853 | Disposition: E | Source: Skilled Nursing Facility | Attending: Family Medicine | Admitting: Family Medicine

## 2024-03-04 ENCOUNTER — Encounter (HOSPITAL_COMMUNITY): Payer: Self-pay | Admitting: Emergency Medicine

## 2024-03-04 DIAGNOSIS — D689 Coagulation defect, unspecified: Secondary | ICD-10-CM | POA: Diagnosis present

## 2024-03-04 DIAGNOSIS — K219 Gastro-esophageal reflux disease without esophagitis: Secondary | ICD-10-CM | POA: Diagnosis not present

## 2024-03-04 DIAGNOSIS — I480 Paroxysmal atrial fibrillation: Secondary | ICD-10-CM | POA: Diagnosis not present

## 2024-03-04 DIAGNOSIS — K810 Acute cholecystitis: Secondary | ICD-10-CM | POA: Diagnosis present

## 2024-03-04 DIAGNOSIS — I5043 Acute on chronic combined systolic (congestive) and diastolic (congestive) heart failure: Secondary | ICD-10-CM | POA: Diagnosis present

## 2024-03-04 DIAGNOSIS — I43 Cardiomyopathy in diseases classified elsewhere: Secondary | ICD-10-CM | POA: Diagnosis not present

## 2024-03-04 DIAGNOSIS — Z8673 Personal history of transient ischemic attack (TIA), and cerebral infarction without residual deficits: Secondary | ICD-10-CM | POA: Diagnosis not present

## 2024-03-04 DIAGNOSIS — Z7401 Bed confinement status: Secondary | ICD-10-CM

## 2024-03-04 DIAGNOSIS — Z8616 Personal history of COVID-19: Secondary | ICD-10-CM

## 2024-03-04 DIAGNOSIS — Z833 Family history of diabetes mellitus: Secondary | ICD-10-CM

## 2024-03-04 DIAGNOSIS — D61818 Other pancytopenia: Secondary | ICD-10-CM | POA: Diagnosis present

## 2024-03-04 DIAGNOSIS — I4430 Unspecified atrioventricular block: Secondary | ICD-10-CM | POA: Diagnosis present

## 2024-03-04 DIAGNOSIS — H9203 Otalgia, bilateral: Secondary | ICD-10-CM | POA: Diagnosis present

## 2024-03-04 DIAGNOSIS — Z8679 Personal history of other diseases of the circulatory system: Secondary | ICD-10-CM

## 2024-03-04 DIAGNOSIS — E860 Dehydration: Secondary | ICD-10-CM | POA: Diagnosis present

## 2024-03-04 DIAGNOSIS — I483 Typical atrial flutter: Secondary | ICD-10-CM

## 2024-03-04 DIAGNOSIS — J90 Pleural effusion, not elsewhere classified: Secondary | ICD-10-CM | POA: Diagnosis not present

## 2024-03-04 DIAGNOSIS — F32A Depression, unspecified: Secondary | ICD-10-CM | POA: Diagnosis present

## 2024-03-04 DIAGNOSIS — I2510 Atherosclerotic heart disease of native coronary artery without angina pectoris: Secondary | ICD-10-CM | POA: Diagnosis present

## 2024-03-04 DIAGNOSIS — D472 Monoclonal gammopathy: Secondary | ICD-10-CM | POA: Diagnosis present

## 2024-03-04 DIAGNOSIS — R6881 Early satiety: Secondary | ICD-10-CM | POA: Diagnosis present

## 2024-03-04 DIAGNOSIS — E038 Other specified hypothyroidism: Secondary | ICD-10-CM

## 2024-03-04 DIAGNOSIS — Z794 Long term (current) use of insulin: Secondary | ICD-10-CM | POA: Diagnosis not present

## 2024-03-04 DIAGNOSIS — Z681 Body mass index (BMI) 19 or less, adult: Secondary | ICD-10-CM

## 2024-03-04 DIAGNOSIS — Z860101 Personal history of adenomatous and serrated colon polyps: Secondary | ICD-10-CM

## 2024-03-04 DIAGNOSIS — I272 Pulmonary hypertension, unspecified: Secondary | ICD-10-CM | POA: Diagnosis present

## 2024-03-04 DIAGNOSIS — A4189 Other specified sepsis: Principal | ICD-10-CM | POA: Diagnosis present

## 2024-03-04 DIAGNOSIS — L89120 Pressure ulcer of left upper back, unstageable: Secondary | ICD-10-CM | POA: Diagnosis present

## 2024-03-04 DIAGNOSIS — R0682 Tachypnea, not elsewhere classified: Secondary | ICD-10-CM | POA: Diagnosis present

## 2024-03-04 DIAGNOSIS — R6520 Severe sepsis without septic shock: Secondary | ICD-10-CM | POA: Diagnosis not present

## 2024-03-04 DIAGNOSIS — J918 Pleural effusion in other conditions classified elsewhere: Secondary | ICD-10-CM | POA: Diagnosis present

## 2024-03-04 DIAGNOSIS — I69351 Hemiplegia and hemiparesis following cerebral infarction affecting right dominant side: Secondary | ICD-10-CM

## 2024-03-04 DIAGNOSIS — E854 Organ-limited amyloidosis: Secondary | ICD-10-CM | POA: Diagnosis present

## 2024-03-04 DIAGNOSIS — I10 Essential (primary) hypertension: Secondary | ICD-10-CM | POA: Diagnosis present

## 2024-03-04 DIAGNOSIS — G9341 Metabolic encephalopathy: Secondary | ICD-10-CM | POA: Diagnosis present

## 2024-03-04 DIAGNOSIS — J189 Pneumonia, unspecified organism: Secondary | ICD-10-CM | POA: Diagnosis present

## 2024-03-04 DIAGNOSIS — F4322 Adjustment disorder with anxiety: Secondary | ICD-10-CM | POA: Diagnosis present

## 2024-03-04 DIAGNOSIS — E119 Type 2 diabetes mellitus without complications: Secondary | ICD-10-CM

## 2024-03-04 DIAGNOSIS — R1310 Dysphagia, unspecified: Secondary | ICD-10-CM | POA: Diagnosis present

## 2024-03-04 DIAGNOSIS — J690 Pneumonitis due to inhalation of food and vomit: Secondary | ICD-10-CM | POA: Diagnosis not present

## 2024-03-04 DIAGNOSIS — D75838 Other thrombocytosis: Secondary | ICD-10-CM | POA: Diagnosis present

## 2024-03-04 DIAGNOSIS — I110 Hypertensive heart disease with heart failure: Secondary | ICD-10-CM | POA: Diagnosis present

## 2024-03-04 DIAGNOSIS — L98423 Non-pressure chronic ulcer of back with necrosis of muscle: Secondary | ICD-10-CM

## 2024-03-04 DIAGNOSIS — Z66 Do not resuscitate: Secondary | ICD-10-CM | POA: Diagnosis present

## 2024-03-04 DIAGNOSIS — G901 Familial dysautonomia [Riley-Day]: Secondary | ICD-10-CM | POA: Diagnosis present

## 2024-03-04 DIAGNOSIS — E0590 Thyrotoxicosis, unspecified without thyrotoxic crisis or storm: Secondary | ICD-10-CM | POA: Diagnosis present

## 2024-03-04 DIAGNOSIS — Z515 Encounter for palliative care: Secondary | ICD-10-CM

## 2024-03-04 DIAGNOSIS — E7849 Other hyperlipidemia: Secondary | ICD-10-CM | POA: Diagnosis not present

## 2024-03-04 DIAGNOSIS — I4821 Permanent atrial fibrillation: Secondary | ICD-10-CM | POA: Diagnosis present

## 2024-03-04 DIAGNOSIS — Z79890 Hormone replacement therapy (postmenopausal): Secondary | ICD-10-CM

## 2024-03-04 DIAGNOSIS — E872 Acidosis, unspecified: Secondary | ICD-10-CM | POA: Diagnosis present

## 2024-03-04 DIAGNOSIS — E8582 Wild-type transthyretin-related (ATTR) amyloidosis: Secondary | ICD-10-CM | POA: Diagnosis present

## 2024-03-04 DIAGNOSIS — L89616 Pressure-induced deep tissue damage of right heel: Secondary | ICD-10-CM | POA: Diagnosis present

## 2024-03-04 DIAGNOSIS — Z79899 Other long term (current) drug therapy: Secondary | ICD-10-CM

## 2024-03-04 DIAGNOSIS — N400 Enlarged prostate without lower urinary tract symptoms: Secondary | ICD-10-CM | POA: Diagnosis present

## 2024-03-04 DIAGNOSIS — F419 Anxiety disorder, unspecified: Secondary | ICD-10-CM | POA: Diagnosis not present

## 2024-03-04 DIAGNOSIS — R21 Rash and other nonspecific skin eruption: Secondary | ICD-10-CM | POA: Diagnosis present

## 2024-03-04 DIAGNOSIS — Z7901 Long term (current) use of anticoagulants: Secondary | ICD-10-CM

## 2024-03-04 DIAGNOSIS — L89220 Pressure ulcer of left hip, unstageable: Secondary | ICD-10-CM | POA: Diagnosis present

## 2024-03-04 DIAGNOSIS — R571 Hypovolemic shock: Secondary | ICD-10-CM | POA: Diagnosis present

## 2024-03-04 DIAGNOSIS — I471 Supraventricular tachycardia, unspecified: Secondary | ICD-10-CM | POA: Diagnosis present

## 2024-03-04 DIAGNOSIS — I4892 Unspecified atrial flutter: Secondary | ICD-10-CM | POA: Diagnosis not present

## 2024-03-04 DIAGNOSIS — L89229 Pressure ulcer of left hip, unspecified stage: Secondary | ICD-10-CM

## 2024-03-04 DIAGNOSIS — E1169 Type 2 diabetes mellitus with other specified complication: Secondary | ICD-10-CM | POA: Diagnosis present

## 2024-03-04 DIAGNOSIS — L98429 Non-pressure chronic ulcer of back with unspecified severity: Secondary | ICD-10-CM

## 2024-03-04 DIAGNOSIS — E785 Hyperlipidemia, unspecified: Secondary | ICD-10-CM | POA: Diagnosis not present

## 2024-03-04 DIAGNOSIS — R81 Glycosuria: Secondary | ICD-10-CM | POA: Diagnosis present

## 2024-03-04 DIAGNOSIS — U071 COVID-19: Secondary | ICD-10-CM | POA: Diagnosis present

## 2024-03-04 DIAGNOSIS — Z832 Family history of diseases of the blood and blood-forming organs and certain disorders involving the immune mechanism: Secondary | ICD-10-CM

## 2024-03-04 DIAGNOSIS — E1165 Type 2 diabetes mellitus with hyperglycemia: Secondary | ICD-10-CM | POA: Diagnosis present

## 2024-03-04 DIAGNOSIS — H547 Unspecified visual loss: Secondary | ICD-10-CM | POA: Diagnosis present

## 2024-03-04 DIAGNOSIS — I5022 Chronic systolic (congestive) heart failure: Secondary | ICD-10-CM | POA: Diagnosis present

## 2024-03-04 DIAGNOSIS — E870 Hyperosmolality and hypernatremia: Secondary | ICD-10-CM | POA: Diagnosis present

## 2024-03-04 DIAGNOSIS — Z7984 Long term (current) use of oral hypoglycemic drugs: Secondary | ICD-10-CM

## 2024-03-04 DIAGNOSIS — E039 Hypothyroidism, unspecified: Secondary | ICD-10-CM | POA: Diagnosis present

## 2024-03-04 DIAGNOSIS — I959 Hypotension, unspecified: Secondary | ICD-10-CM | POA: Diagnosis not present

## 2024-03-04 DIAGNOSIS — E43 Unspecified severe protein-calorie malnutrition: Secondary | ICD-10-CM | POA: Diagnosis present

## 2024-03-04 DIAGNOSIS — I951 Orthostatic hypotension: Secondary | ICD-10-CM | POA: Diagnosis present

## 2024-03-04 DIAGNOSIS — Z79891 Long term (current) use of opiate analgesic: Secondary | ICD-10-CM

## 2024-03-04 DIAGNOSIS — E1140 Type 2 diabetes mellitus with diabetic neuropathy, unspecified: Secondary | ICD-10-CM | POA: Diagnosis present

## 2024-03-04 DIAGNOSIS — K5641 Fecal impaction: Secondary | ICD-10-CM | POA: Diagnosis not present

## 2024-03-04 DIAGNOSIS — E861 Hypovolemia: Secondary | ICD-10-CM | POA: Diagnosis present

## 2024-03-04 DIAGNOSIS — R6521 Severe sepsis with septic shock: Secondary | ICD-10-CM | POA: Diagnosis not present

## 2024-03-04 DIAGNOSIS — M4628 Osteomyelitis of vertebra, sacral and sacrococcygeal region: Secondary | ICD-10-CM | POA: Diagnosis present

## 2024-03-04 DIAGNOSIS — L89222 Pressure ulcer of left hip, stage 2: Secondary | ICD-10-CM | POA: Diagnosis present

## 2024-03-04 DIAGNOSIS — I472 Ventricular tachycardia, unspecified: Secondary | ICD-10-CM | POA: Diagnosis not present

## 2024-03-04 DIAGNOSIS — G8929 Other chronic pain: Secondary | ICD-10-CM | POA: Diagnosis present

## 2024-03-04 DIAGNOSIS — Z7189 Other specified counseling: Secondary | ICD-10-CM | POA: Diagnosis not present

## 2024-03-04 DIAGNOSIS — R000 Tachycardia, unspecified: Secondary | ICD-10-CM | POA: Diagnosis present

## 2024-03-04 DIAGNOSIS — L89896 Pressure-induced deep tissue damage of other site: Secondary | ICD-10-CM | POA: Diagnosis present

## 2024-03-04 DIAGNOSIS — I4891 Unspecified atrial fibrillation: Secondary | ICD-10-CM | POA: Insufficient documentation

## 2024-03-04 DIAGNOSIS — R633 Feeding difficulties, unspecified: Secondary | ICD-10-CM | POA: Diagnosis present

## 2024-03-04 DIAGNOSIS — R54 Age-related physical debility: Secondary | ICD-10-CM | POA: Diagnosis present

## 2024-03-04 DIAGNOSIS — R64 Cachexia: Secondary | ICD-10-CM | POA: Diagnosis not present

## 2024-03-04 DIAGNOSIS — I5033 Acute on chronic diastolic (congestive) heart failure: Secondary | ICD-10-CM | POA: Diagnosis not present

## 2024-03-04 DIAGNOSIS — L89610 Pressure ulcer of right heel, unstageable: Secondary | ICD-10-CM | POA: Diagnosis present

## 2024-03-04 DIAGNOSIS — K761 Chronic passive congestion of liver: Secondary | ICD-10-CM | POA: Diagnosis present

## 2024-03-04 DIAGNOSIS — N5089 Other specified disorders of the male genital organs: Secondary | ICD-10-CM | POA: Diagnosis present

## 2024-03-04 DIAGNOSIS — A419 Sepsis, unspecified organism: Secondary | ICD-10-CM | POA: Diagnosis present

## 2024-03-04 DIAGNOSIS — L89154 Pressure ulcer of sacral region, stage 4: Secondary | ICD-10-CM | POA: Diagnosis present

## 2024-03-04 DIAGNOSIS — D638 Anemia in other chronic diseases classified elsewhere: Secondary | ICD-10-CM | POA: Diagnosis present

## 2024-03-04 DIAGNOSIS — E875 Hyperkalemia: Secondary | ICD-10-CM | POA: Diagnosis present

## 2024-03-04 DIAGNOSIS — Z800 Family history of malignant neoplasm of digestive organs: Secondary | ICD-10-CM

## 2024-03-04 DIAGNOSIS — L89159 Pressure ulcer of sacral region, unspecified stage: Secondary | ICD-10-CM | POA: Diagnosis not present

## 2024-03-04 DIAGNOSIS — Z880 Allergy status to penicillin: Secondary | ICD-10-CM

## 2024-03-04 DIAGNOSIS — I428 Other cardiomyopathies: Secondary | ICD-10-CM | POA: Diagnosis present

## 2024-03-04 DIAGNOSIS — R627 Adult failure to thrive: Secondary | ICD-10-CM | POA: Diagnosis present

## 2024-03-04 LAB — LACTIC ACID, PLASMA
Lactic Acid, Venous: 2.8 mmol/L (ref 0.5–1.9)
Lactic Acid, Venous: 2.3 mmol/L (ref 0.5–1.9)

## 2024-03-04 LAB — CBC WITH DIFFERENTIAL/PLATELET
Abs Immature Granulocytes: 0.1 10*3/uL — ABNORMAL HIGH (ref 0.00–0.07)
Basophils Absolute: 0.1 10*3/uL (ref 0.0–0.1)
Basophils Relative: 0 %
Eosinophils Absolute: 0.1 10*3/uL (ref 0.0–0.5)
Eosinophils Relative: 1 %
HCT: 30.8 % — ABNORMAL LOW (ref 39.0–52.0)
Hemoglobin: 10.8 g/dL — ABNORMAL LOW (ref 13.0–17.0)
Immature Granulocytes: 1 %
Lymphocytes Relative: 11 %
Lymphs Abs: 1.4 10*3/uL (ref 0.7–4.0)
MCH: 29.5 pg (ref 26.0–34.0)
MCHC: 35.1 g/dL (ref 30.0–36.0)
MCV: 84.2 fL (ref 80.0–100.0)
Monocytes Absolute: 0.8 10*3/uL (ref 0.1–1.0)
Monocytes Relative: 6 %
Neutro Abs: 10.8 10*3/uL — ABNORMAL HIGH (ref 1.7–7.7)
Neutrophils Relative %: 81 %
Platelets: 378 10*3/uL (ref 150–400)
RBC: 3.66 MIL/uL — ABNORMAL LOW (ref 4.22–5.81)
RDW: 13.4 % (ref 11.5–15.5)
WBC: 13.3 10*3/uL — ABNORMAL HIGH (ref 4.0–10.5)
nRBC: 0 % (ref 0.0–0.2)

## 2024-03-04 LAB — URINALYSIS, W/ REFLEX TO CULTURE (INFECTION SUSPECTED)
Bacteria, UA: NONE SEEN
Bilirubin Urine: NEGATIVE
Glucose, UA: >=500 mg/dL — AB
Ketones, ur: NEGATIVE mg/dL
Leukocytes,Ua: NEGATIVE
Nitrite: NEGATIVE
Protein, ur: 30 mg/dL — AB
Specific Gravity, Urine: 1.016 (ref 1.005–1.030)
pH: 6 (ref 5.0–8.0)

## 2024-03-04 LAB — COMPREHENSIVE METABOLIC PANEL WITH GFR
ALT: 40 U/L (ref 0–44)
AST: 60 U/L — ABNORMAL HIGH (ref 15–41)
Albumin: <1.5 g/dL — ABNORMAL LOW (ref 3.5–5.0)
Alkaline Phosphatase: 88 U/L (ref 38–126)
Anion gap: 9 (ref 5–15)
BUN: 28 mg/dL — ABNORMAL HIGH (ref 8–23)
CO2: 21 mmol/L — ABNORMAL LOW (ref 22–32)
Calcium: 7.8 mg/dL — ABNORMAL LOW (ref 8.9–10.3)
Chloride: 100 mmol/L (ref 98–111)
Creatinine, Ser: 1.02 mg/dL (ref 0.61–1.24)
GFR, Estimated: >60 mL/min (ref >60)
Glucose, Bld: 500 mg/dL — ABNORMAL HIGH (ref 70–99)
Potassium: 5 mmol/L (ref 3.5–5.1)
Sodium: 130 mmol/L — ABNORMAL LOW (ref 135–145)
Total Bilirubin: 0.5 mg/dL (ref 0.0–1.2)
Total Protein: 4.9 g/dL — ABNORMAL LOW (ref 6.5–8.1)

## 2024-03-04 LAB — APTT: aPTT: 54 s — ABNORMAL HIGH (ref 24–36)

## 2024-03-04 LAB — PROTIME-INR
INR: 1.6 — ABNORMAL HIGH (ref 0.8–1.2)
Prothrombin Time: 19.7 s — ABNORMAL HIGH (ref 11.4–15.2)

## 2024-03-04 LAB — RESP PANEL BY RT-PCR (RSV, FLU A&B, COVID)  RVPGX2
Influenza A by PCR: NEGATIVE
Influenza B by PCR: NEGATIVE
Resp Syncytial Virus by PCR: NEGATIVE
SARS Coronavirus 2 by RT PCR: POSITIVE — AB

## 2024-03-04 LAB — CBG MONITORING, ED: Glucose-Capillary: 412 mg/dL — ABNORMAL HIGH (ref 70–99)

## 2024-03-04 MED ORDER — ACETAMINOPHEN 650 MG RE SUPP: 650.0000 mg | Freq: Four times a day (QID) | RECTAL | Status: DC | PRN

## 2024-03-04 MED ORDER — CEFEPIME HCL 2 G IV SOLR
2.0000 g | Freq: Two times a day (BID) | INTRAVENOUS | Status: DC
  Administered 2024-03-05: 2 g via INTRAVENOUS
  Filled 2024-03-04: qty 12.5

## 2024-03-04 MED ORDER — ALBUMIN HUMAN 25 % IV SOLN
50.0000 g | INTRAVENOUS | Status: AC
  Administered 2024-03-04: 50 g via INTRAVENOUS
  Filled 2024-03-04: qty 200

## 2024-03-04 MED ORDER — METRONIDAZOLE 500 MG/100ML IV SOLN
500.0000 mg | Freq: Two times a day (BID) | INTRAVENOUS | Status: DC
  Administered 2024-03-04 – 2024-03-07 (×6): 500 mg via INTRAVENOUS
  Filled 2024-03-04 (×6): qty 100

## 2024-03-04 MED ORDER — LACTATED RINGERS IV BOLUS (SEPSIS)
1000.0000 mL | Freq: Once | INTRAVENOUS | Status: AC
  Administered 2024-03-04: 1000 mL via INTRAVENOUS

## 2024-03-04 MED ORDER — ONDANSETRON HCL 4 MG PO TABS: 4.0000 mg | ORAL_TABLET | Freq: Four times a day (QID) | ORAL | Status: DC | PRN

## 2024-03-04 MED ORDER — ACETAMINOPHEN 325 MG PO TABS: 650.0000 mg | ORAL_TABLET | Freq: Four times a day (QID) | ORAL | Status: DC | PRN

## 2024-03-04 MED ORDER — AMIODARONE LOAD VIA INFUSION
150.0000 mg | Freq: Once | INTRAVENOUS | Status: AC
  Administered 2024-03-05: 150 mg via INTRAVENOUS
  Filled 2024-03-04: qty 83.34

## 2024-03-04 MED ORDER — ACETAMINOPHEN 10 MG/ML IV SOLN
1000.0000 mg | INTRAVENOUS | Status: AC
  Administered 2024-03-04: 1000 mg via INTRAVENOUS
  Filled 2024-03-04: qty 100

## 2024-03-04 MED ORDER — INSULIN ASPART 100 UNIT/ML IJ SOLN: 0.0000 [IU] | INTRAMUSCULAR | Status: DC

## 2024-03-04 MED ORDER — VANCOMYCIN HCL 1.25 G IV SOLR
1250.0000 mg | INTRAVENOUS | Status: DC
  Filled 2024-03-04: qty 25

## 2024-03-04 MED ORDER — FENTANYL CITRATE PF 50 MCG/ML IJ SOSY
25.0000 ug | PREFILLED_SYRINGE | Freq: Once | INTRAMUSCULAR | Status: AC
  Administered 2024-03-04: 25 ug via INTRAVENOUS
  Filled 2024-03-04: qty 1

## 2024-03-04 MED ORDER — SODIUM CHLORIDE 0.9 % IV SOLN
2.0000 g | Freq: Once | INTRAVENOUS | Status: AC
  Administered 2024-03-04: 2 g via INTRAVENOUS
  Filled 2024-03-04: qty 12.5

## 2024-03-04 MED ORDER — IOHEXOL 350 MG/ML SOLN
75.0000 mL | Freq: Once | INTRAVENOUS | Status: AC | PRN
  Administered 2024-03-04: 75 mL via INTRAVENOUS

## 2024-03-04 MED ORDER — VANCOMYCIN HCL 1250 MG/250ML IV SOLN: 1250.0000 mg | INTRAVENOUS | Status: DC

## 2024-03-04 MED ORDER — SODIUM CHLORIDE 0.9% FLUSH
3.0000 mL | Freq: Two times a day (BID) | INTRAVENOUS | Status: DC
  Administered 2024-03-05 – 2024-05-11 (×124): 3 mL via INTRAVENOUS

## 2024-03-04 MED ORDER — SODIUM CHLORIDE 0.9% FLUSH: 3.0000 mL | INTRAVENOUS | Status: DC | PRN

## 2024-03-04 MED ORDER — METRONIDAZOLE 500 MG/100ML IV SOLN: 500.0000 mg | Freq: Two times a day (BID) | INTRAVENOUS | Status: DC

## 2024-03-04 MED ORDER — LEVOTHYROXINE SODIUM 50 MCG PO TABS
50.0000 ug | ORAL_TABLET | Freq: Every day | ORAL | Status: DC
  Administered 2024-03-05 – 2024-08-21 (×170): 50 ug via ORAL
  Filled 2024-03-04: qty 1
  Filled 2024-03-04: qty 2
  Filled 2024-03-04 (×56): qty 1
  Filled 2024-03-04: qty 2
  Filled 2024-03-04 (×91): qty 1
  Filled 2024-03-04: qty 2
  Filled 2024-03-04 (×16): qty 1

## 2024-03-04 MED ORDER — LACTATED RINGERS IV BOLUS (SEPSIS)
250.0000 mL | Freq: Once | INTRAVENOUS | Status: AC
  Administered 2024-03-04: 250 mL via INTRAVENOUS

## 2024-03-04 MED ORDER — ACETAMINOPHEN 325 MG PO TABS
650.0000 mg | ORAL_TABLET | Freq: Four times a day (QID) | ORAL | Status: DC | PRN
Start: 2024-03-05 — End: 2024-06-15
  Administered 2024-03-06 – 2024-06-15 (×26): 650 mg via ORAL
  Filled 2024-03-04 (×26): qty 2

## 2024-03-04 MED ORDER — AMIODARONE LOAD VIA INFUSION
150.0000 mg | Freq: Once | INTRAVENOUS | Status: DC
Start: 2024-03-04 — End: 2024-03-04

## 2024-03-04 MED ORDER — ACETAMINOPHEN 10 MG/ML IV SOLN: 1000.0000 mg | Freq: Once | INTRAVENOUS | Status: DC

## 2024-03-04 MED ORDER — INSULIN ASPART 100 UNIT/ML IJ SOLN: 0.0000 [IU] | Freq: Four times a day (QID) | INTRAMUSCULAR | Status: DC | PRN

## 2024-03-04 MED ORDER — FINASTERIDE 5 MG PO TABS: 5.0000 mg | ORAL_TABLET | Freq: Every day | ORAL | Status: DC

## 2024-03-04 MED ORDER — ONDANSETRON HCL 4 MG/2ML IJ SOLN
4.0000 mg | Freq: Four times a day (QID) | INTRAMUSCULAR | Status: DC | PRN
  Administered 2024-08-01 – 2024-08-21 (×2): 4 mg via INTRAVENOUS
  Filled 2024-03-04 (×2): qty 2

## 2024-03-04 MED ORDER — DEXTROSE 5 % IV SOLN
100.0000 mg | Freq: Once | INTRAVENOUS | Status: DC
  Filled 2024-03-04: qty 2

## 2024-03-04 MED ORDER — VANCOMYCIN HCL 1500 MG/300ML IV SOLN
1500.0000 mg | Freq: Once | INTRAVENOUS | Status: AC
  Administered 2024-03-04: 1500 mg via INTRAVENOUS
  Filled 2024-03-04: qty 300

## 2024-03-04 MED ORDER — FERROUS SULFATE 325 (65 FE) MG PO TABS
325.0000 mg | ORAL_TABLET | Freq: Every day | ORAL | Status: DC
  Administered 2024-03-04 – 2024-07-28 (×149): 325 mg via ORAL
  Filled 2024-03-04 (×147): qty 1

## 2024-03-04 MED ORDER — LACTATED RINGERS IV BOLUS
1000.0000 mL | INTRAVENOUS | Status: AC
  Administered 2024-03-04: 1000 mL via INTRAVENOUS

## 2024-03-04 MED ORDER — SODIUM CHLORIDE 0.9 % IV SOLN: 2.0000 g | Freq: Once | INTRAVENOUS | Status: DC

## 2024-03-04 MED ORDER — AMIODARONE HCL IN DEXTROSE 360-4.14 MG/200ML-% IV SOLN: 60.0000 mg/h | INTRAVENOUS | Status: DC

## 2024-03-04 MED ORDER — SODIUM CHLORIDE 0.9 % IV SOLN: 250.0000 mL | INTRAVENOUS | Status: AC | PRN

## 2024-03-04 MED ORDER — APIXABAN 5 MG PO TABS
5.0000 mg | ORAL_TABLET | Freq: Two times a day (BID) | ORAL | Status: DC
  Administered 2024-03-04: 5 mg via ORAL
  Filled 2024-03-04: qty 1

## 2024-03-04 MED ORDER — LACTATED RINGERS IV SOLN
150.0000 mL/h | INTRAVENOUS | Status: DC
  Administered 2024-03-04: 150 mL/h via INTRAVENOUS

## 2024-03-04 MED ORDER — AMIODARONE LOAD VIA INFUSION
150.0000 mg | Freq: Once | INTRAVENOUS | Status: DC
  Filled 2024-03-04: qty 83.34

## 2024-03-04 MED ORDER — AMIODARONE HCL IN DEXTROSE 360-4.14 MG/200ML-% IV SOLN: 30.0000 mg/h | INTRAVENOUS | Status: DC

## 2024-03-04 MED ORDER — ATORVASTATIN CALCIUM 40 MG PO TABS
40.0000 mg | ORAL_TABLET | Freq: Every day | ORAL | Status: DC
  Administered 2024-03-04 – 2024-07-01 (×119): 40 mg via ORAL
  Filled 2024-03-04 (×121): qty 1

## 2024-03-04 MED ORDER — AMIODARONE HCL IN DEXTROSE 360-4.14 MG/200ML-% IV SOLN
30.0000 mg/h | INTRAVENOUS | Status: DC
  Administered 2024-03-05 – 2024-03-08 (×9): 30 mg/h via INTRAVENOUS
  Filled 2024-03-04 (×8): qty 200

## 2024-03-04 NOTE — Sepsis Progress Note (Incomplete Revision)
 Elink following for sepsis protocol, Sepsis Called at 21:50, Lactic and BC drawn at 17:44, Abx's and fluids given

## 2024-03-04 NOTE — Consult Note (Signed)
 Cardiology Consultation   Patient ID: Earl [name] MRN: [id number]; DOB: [birth year]  Admit date: 03/04/2024 Date of Consult: 03/04/2024  PCP:  [name], MD   [facility] Providers Cardiologist:  Sherryl Manges, MD  Electrophysiologist:  Sherryl Manges, MD  Advanced Heart Failure:  Marca Ancona, MD       Patient Profile:   Earl [name] is a 83 y.o. 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  History of Present Illness:   Relevant CV history - Echo 9/22 LVEF 25-30%  - Cardiolite in 9/22 LVEF 27%, no ischemia - RHC/LHC in 11/22 with mild nonobstructive CAD, CI 4.36, optimized filling pressures  - Cardiac MRI in 1/23 with LVEF 45%, mild RV dilation with RV EF 48%, mid-wall patchy LGE in the basal to mid septum, ECV 52% in septum, mild-moderate MR, mild-moderate AI.  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   Earl [name] was recently admitted from 3/26-4/08/2024 after presenting with weakness, encephalopathy and found to have COVID-19 infection and rhabdomyolysis.  He improved with supportive care, discontinuation of baclofen and IV fluids.  He was noted to be hypertensive at various times throughout his hospitalization resulting in discontinuation of GDMT and diuresis.  He also had episodes of vasovagal syncope related to ATTR amyloidosis and autonomic dysfunction.  Prescribed abdominal binder.  He was ultimately discharged to acute rehab.  Today, he is accompanied in the emergency room by his daughter who helped provide  most of the history.  She states that he has been eating poorly recently and has not eaten much today.  He has had worsening sacral pain and has a known sacral decubitus ulcer.  This has made it very difficult for him to lay on his back or ambulate.  He has also been very weak.  Given the symptoms, his care team was concerned that he needed additional IV fluids and was ultimately transferred to the emergency room for further evaluation.  He denies any recent fevers, chills, new dyspnea, chest discomfort, abdominal pain/discomfort, nausea, vomiting, lower extremity edema, orthopnea, PND.  He reports that he has been taking his apixaban 5 mg twice daily without missed doses.  In the ED afebrile, heart rate up to the 150s, SBP 70s-100/50-70s, not requiring supplemental oxygen.  Labs notable for leukocytosis to 13.3, hemoglobin 10.8 (baseline 12), Na 130, glucose 500, BUN/creatinine 28/1, albumin <1.5, AST/ALT 60/41, alk phos 88, T. bili 0.5, INR 1.6, anion gap 9, initial lactate 2.8-->2.3, UA with significant glucosuria and no signs of infection.  Chest x-ray with clear lung fields.  CT C/A/P with contrast with small left pleural effusion, sacral decubitus wound with extension into the right gluteal muscles.  He received over 3 L of IVF, vancomycin, cefepime, metronidazole.  Review of telemetry revealed predominantly atrial fibrillation with RVR with periods of regular, narrow complex long RP tachycardia with ventricular rate in the 150s most consistent with atrial flutter.   Past Medical History:  Diagnosis Date   Adenomatous colon polyp    Allergy    Anal fissure    Arthritis    BPH (benign prostatic hyperplasia)  Diabetes mellitus (HCC)    GERD (gastroesophageal reflux disease)    Hyperlipidemia    Hypertension    Hypothyroidism    Other testicular hypofunction    Stroke [facility])     Past Surgical History:  Procedure Laterality Date   CIRCUMCISION N/A 06/11/2019   Procedure: CIRCUMCISION ADULT;   Surgeon: Christina Coyer, MD;  Location: [facility] ORS;  Service: Urology;  Laterality: N/A;   COLONOSCOPY     INGUINAL HERNIA REPAIR Bilateral    1968 and 2004   LOOP RECORDER INSERTION N/A 07/28/2017   Procedure: LOOP RECORDER INSERTION;  Surgeon: Verona Goodwill, MD;  Location: [facility] INVASIVE CV LAB;  Service: Cardiovascular;  Laterality: N/A;   RIGHT/LEFT HEART CATH AND CORONARY ANGIOGRAPHY N/A 10/07/2021   Procedure: RIGHT/LEFT HEART CATH AND CORONARY ANGIOGRAPHY;  Surgeon: Darlis Eisenmenger, MD;  Location: [facility] INVASIVE CV LAB;  Service: Cardiovascular;  Laterality: N/A;   TEE WITHOUT CARDIOVERSION N/A 07/28/2017   Procedure: TRANSESOPHAGEAL ECHOCARDIOGRAM (TEE);  Surgeon: Jacqueline Matsu, MD;  Location: [facility] ENDOSCOPY;  Service: Cardiovascular;  Laterality: N/A;     Home Medications:  Prior to Admission medications   Medication Sig Start Date End Date Taking? Authorizing Provider  Amino Acids-Protein Hydrolys (PRO-STAT AWC) LIQD Take 30 mLs by mouth in the morning and at bedtime.   Yes [provider]  atorvastatin (LIPITOR) 40 MG tablet Take  1 tablet  Daily for Cholesterol                                                       /                                  TAKE                                                       BY                                           MOUTH 01/11/24  Yes Webb, Padonda B, FNP  Cholecalciferol (VITAMIN D PO) Take 1 tablet by mouth daily.    Yes [provider]  Cyanocobalamin (B-12) 50 MCG TABS Take 50 mcg by mouth daily.   Yes [provider]  ELIQUIS 5 MG TABS tablet TAKE 1 TABLET BY MOUTH TWICE  DAILY TO PREVENT BLOOD CLOTS 11/09/23  Yes Wilkinson, Dana E, FNP  Emollient (AQUAPHOR ADV PROTECT HEALING) OINT Apply small amount to two areas on buttocks daily to prevent breakdown 07/19/23  Yes Wilkinson, Dana E, FNP  ferrous sulfate 325 (65 FE) MG tablet Take 325 mg by mouth at bedtime.   Yes [provider]  finasteride (PROSCAR) 5 MG tablet  TAKE 1 TABLET BY MOUTH DAILY FOR PROSTATE 09/15/23  Yes Wilkinson, Dana E, FNP  levothyroxine (SYNTHROID) 50 MCG tablet TAKE 1 TABLET BY MOUTH DAILY ON  AN EMPTY STOMACH WITH ONLY WATER FOR 30 MINUTES AND NO ANTACID,  MEDS, CALCIUM OR MAGNESIUM FOR  4 HOURS AND AVOID BIOTIN Patient taking differently: Take 50 mcg by mouth daily before breakfast. ON AN EMPTY STOMACH WITH ONLY WATER FOR 30 MINUTES AND NO ANTACID,  MEDS, CALCIUM OR MAGNESIUM FOR 4 HOURS AND AVOID BIOTIN 05/23/23  Yes [name], FNP  Magnesium 250 MG TABS Take 250 mg by mouth daily.    Yes [provider]  metFORMIN (GLUCOPHAGE-XR) 500 MG 24 hr tablet TAKE 2 TABLETS BY MOUTH TWICE  DAILY WITH MEALS FOR DIABETES 07/31/23  Yes [name], FNP  metoprolol succinate (TOPROL-XL) 25 MG 24 hr tablet Take 1 tablet (25 mg total) by mouth at bedtime. 02/29/24 03/30/24 Yes Hughie Closs, MD  Multiple Vitamin (MULTIVITAMIN) capsule Take 1 capsule by mouth daily.   Yes [provider]  Nutritional Supplements (BOOST GLUCOSE CONTROL) LIQD Take 237 mLs by mouth 2 (two) times daily after a meal.   Yes [provider]  potassium chloride SA (KLOR-CON M20) 20 MEQ tablet Take 1 tablet by mouth once daily 09/25/23  Yes Milford, Jessica M, FNP  sacubitril-valsartan (ENTRESTO) 24-26 MG Take 1 tablet by mouth 2 (two) times daily. 01/31/24  Yes Bensimhon, Bevelyn Buckles, MD  Tafamidis [facility]) 61 MG CAPS Take 1 capsule by mouth daily. 04/12/23  Yes Laurey Morale, MD  vitamin C (ASCORBIC ACID) 500 MG tablet Take 500 mg by mouth daily.   Yes [provider]  AMVUTTRA 25 MG/0.5ML syringe INJECT 25MG  (0.5ML) SUBCUTANEOUSLY ONCE EVERY 3 MONTHS. ROTATE ADMINISTRATION SITE WITH EACH INJECTION. *REFRIGERATE. DO NOT SHAKE.* Patient not taking: Reported on 03/04/2024 10/24/23   Laurey Morale, MD  Blood Glucose Monitoring Suppl (ONE TOUCH ULTRA 2) w/Device KIT Check blood sugar 1 time a day-DX-E11.9 03/23/23   [name], MD   clotrimazole-betamethasone (LOTRISONE) cream Apply to affected area 2 to 4 x / day Patient not taking: Reported on 03/04/2024 05/17/23 05/16/24  [name], FNP  glucose blood [facility] ULTRA) test strip Use as instructed 03/23/23   [name], MD  Lancets [facility] DELICA PLUS [place]) MISC CHECK BLOOD SUGAR ONCE A DAY. 03/23/23   [name], MD  Lancets Misc. (ONE TOUCH SURESOFT) MISC Check blood sugar 1 time a day-DX-E11.91 03/23/23   [name], MD  NONFORMULARY OR COMPOUNDED ITEM Please dispense a Rolling Walker.  Dx:R26.81 & G63 12/08/22   Nita Sickle K, DO  trolamine salicylate (ASPERCREME) 10 % cream Apply 1 application topically daily as needed for muscle pain. Patient not taking: Reported on 03/04/2024    [provider]  VITAMIN A PO Take by mouth. 3000 mg Patient not taking: Reported on 03/04/2024    [provider]    Inpatient Medications: Scheduled Meds:  apixaban  5 mg Oral BID   atorvastatin  40 mg Oral Daily   ferrous sulfate  325 mg Oral QHS   [START ON 03/05/2024] finasteride  5 mg Oral Daily   insulin aspart  0-6 Units Subcutaneous Q4H   [START ON 03/05/2024] levothyroxine  50 mcg Oral Q0600   sodium chloride flush  3 mL Intravenous Q12H   Continuous Infusions:  sodium chloride     acetaminophen     [START ON 03/05/2024] ceFEPime (MAXIPIME) IV     lactated ringers Stopped (03/04/24 2216)   metronidazole Stopped (03/04/24 1929)   [START ON 03/05/2024] vancomycin     PRN Meds: sodium chloride, [START ON 03/05/2024] acetaminophen **OR** [START ON 03/05/2024] acetaminophen, ondansetron **OR** ondansetron (ZOFRAN) IV, sodium chloride flush  Allergies:  Allergies  Allergen Reactions   Penicillins Other (See Comments)    Did it involve swelling of the face/tongue/throat, SOB, or low BP? Unknown Did it involve sudden or severe rash/hives, skin peeling, or any reaction on the inside of your mouth or nose? Unknown Did you need to  seek medical attention at a hospital or doctor's office? No When did it last happen?    Over 49 Years Ago   If all above answers are "NO", may proceed with cephalosporin use.      Social History:   Social History   Socioeconomic History   Marital status: Widowed    Spouse name: Not on file   Number of children: 2   Years of education: Not on file   Highest education level: Bachelor's degree (e.g., BA, AB, BS)  Occupational History   Occupation: Academic librarian: RETIRED  Tobacco Use   Smoking status: Never   Smokeless tobacco: Never  Vaping Use   Vaping status: Never Used  Substance and Sexual Activity   Alcohol use: No   Drug use: No   Sexual activity: Not on file  Other Topics Concern   Not on file  Social History Narrative   Right handed   Drinks caffeine prn   One story home      Daughter names Para   Social Drivers of Health   Financial Resource Strain: Patient Declined (01/16/2024)   Overall Financial Resource Strain (CARDIA)    Difficulty of Paying Living Expenses: Patient declined  Food Insecurity: No Food Insecurity (02/15/2024)   Hunger Vital Sign    Worried About Running Out of Food in the Last Year: Never true    Ran Out of Food in the Last Year: Never true  Transportation Needs: No Transportation Needs (02/15/2024)   PRAPARE - Administrator, Civil Service (Medical): No    Lack of Transportation (Non-Medical): No  Physical Activity: Unknown (01/16/2024)   Exercise Vital Sign    Days of Exercise per Week: 0 days    Minutes of Exercise per Session: Not on file  Stress: Stress Concern Present (01/16/2024)   [employer] of Occupational Health - Occupational Stress Questionnaire    Feeling of Stress : To some extent  Social Connections: Moderately Integrated (01/16/2024)   Social Connection and Isolation Panel [NHANES]    Frequency of Communication with Friends and Family: More than three times a week     Frequency of Social Gatherings with Friends and Family: Once a week    Attends Religious Services: More than 4 times per year    Active Member of [employer] or Organizations: Yes    Attends Banker Meetings: More than 4 times per year    Marital Status: Widowed  Intimate Partner Violence: Not At Risk (02/15/2024)   Humiliation, Afraid, Rape, and Kick questionnaire    Fear of Current or Ex-Partner: No    Emotionally Abused: No    Physically Abused: No    Sexually Abused: No    Family History:    Family History  Problem Relation Age of Onset   Sickle cell anemia Mother    Diabetes Father    Stomach cancer Father    Colon cancer Father    CAD Neg Hx    Esophageal cancer Neg Hx    Rectal cancer Neg Hx      ROS:  Please see the history of present illness.   All other ROS reviewed and  negative.     Physical Exam/Data:   Vitals:   03/04/24 2135 03/04/24 2145 03/04/24 2210 03/04/24 2215  BP: 108/75 (!) 94/52  109/73  Pulse: (!) 131 (!) 146  (!) 152  Resp: (!) 21 (!) 24  (!) 30  Temp:   98 F (36.7 C)   TempSrc:   Oral   SpO2: 96% 97%  95%  Weight:      Height:       No intake or output data in the 24 hours ending 03/04/24 2234    03/04/2024    4:56 PM 02/14/2024    7:52 PM 01/24/2024   11:05 AM  Last 3 Weights  Weight (lbs) 151 lb 159 lb 159 lb  Weight (kg) 68.493 kg 72.122 kg 72.122 kg     Body mass index is 21.06 kg/m.  General: Ill-appearing, covered in blankets HEENT: Poor dentition Neck: No JVD at 45 degrees Cardiac: Tachycardic and irregularly irregular without murmur Lungs:  clear to auscultation bilaterally, no wheezing, rhonchi or rales  Abd: soft, nontender, no hepatomegaly  Ext: no edema Musculoskeletal:  No deformities Skin: warm and dry  Neuro:  CNs 2-12 intact, no focal abnormalities noted Psych:  Normal affect   EKG:  The EKG was personally reviewed and demonstrates: Atrial fibrillation with RVR at EKG performed 2217 Telemetry:  Telemetry  was personally reviewed and demonstrates: Predominantly atrial fibrillation with RVR with periods of regular, narrow complex long RP tachycardia with ventricular rates in the 150s most consistent with atrial flutter  Relevant CV Studies: - Echo 9/22 LVEF 25-30%  - Cardiolite in 9/22 LVEF 27%, no ischemia - RHC/LHC in 11/22 with mild nonobstructive CAD, CI 4.36, optimized filling pressures  - Cardiac MRI in 1/23 with LVEF 45%, mild RV dilation with RV EF 48%, mid-wall patchy LGE in the basal to mid septum, ECV 52% in septum, mild-moderate MR, mild-moderate AI.  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   Laboratory Data:  High Sensitivity Troponin:  No results for input(s): "TROPONINIHS" in the last 720 hours.   Chemistry Recent Labs  Lab 02/27/24 0949 03/04/24 1706  NA 139 130*  K 4.1 5.0  CL 105 100  CO2 23 21*  GLUCOSE 125* 500*  BUN 17 28*  CREATININE 0.96 1.02  CALCIUM 8.1* 7.8*  GFRNONAA >60 >60  ANIONGAP 11 9    Recent Labs  Lab 03/04/24 1706  PROT 4.9*  ALBUMIN <1.5*  AST 60*  ALT 40  ALKPHOS 88  BILITOT 0.5   Lipids No results for input(s): "CHOL", "TRIG", "HDL", "LABVLDL", "LDLCALC", "CHOLHDL" in the last 168 hours.  Hematology Recent Labs  Lab 02/27/24 0949 03/04/24 1706  WBC 8.9 13.3*  RBC 4.24 3.66*  HGB 12.4* 10.8*  HCT 36.6* 30.8*  MCV 86.3 84.2  MCH 29.2 29.5  MCHC 33.9 35.1  RDW 13.4 13.4  PLT 339 378   Thyroid No results for input(s): "TSH", "FREET4" in the last 168 hours.  BNPNo results for input(s): "BNP", "PROBNP" in the last 168 hours.  DDimer No results for input(s): "DDIMER" in the last 168 hours.   Radiology/Studies:  CT CHEST ABDOMEN PELVIS W CONTRAST Result Date: 03/04/2024 CLINICAL DATA:  Possible sepsis and sacral wound EXAM: CT CHEST, ABDOMEN,  AND PELVIS WITH CONTRAST TECHNIQUE: Multidetector CT imaging of the chest,  abdomen and pelvis was performed following the standard protocol during bolus administration of intravenous contrast. RADIATION DOSE REDUCTION: This exam was performed according to the departmental dose-optimization program which includes automated exposure control, adjustment of the mA and/or kV according to patient size and/or use of iterative reconstruction technique. CONTRAST:  75mL OMNIPAQUE IOHEXOL 350 MG/ML SOLN COMPARISON:  None Available. FINDINGS: CT CHEST FINDINGS Cardiovascular: Atherosclerotic calcifications of the thoracic aorta are noted without aneurysmal dilatation or dissection. Heart is mildly enlarged in size. The pulmonary artery shows a normal branching pattern bilaterally. No central pulmonary embolus is noted although not timed for embolus evaluation. Mediastinum/Nodes: The esophagus is within normal limits. No hilar or mediastinal adenopathy is noted. A large peripherally enhancing and partially calcified nodule is noted in the left lobe thyroid measuring up to 5 cm. This has been previously biopsied and no further follow-up is recommended. Lipoma of the strap muscles on the left is again seen and stable. Lungs/Pleura: The lungs are well aerated bilaterally. Small left pleural effusion and left basilar atelectasis is noted. Minimal right basilar atelectasis is seen as well. No focal confluent infiltrate is noted. No focal nodule is seen. Musculoskeletal: Degenerative changes of the thoracic spine are noted. No acute bony abnormality is seen. CT ABDOMEN PELVIS FINDINGS Hepatobiliary: No focal liver abnormality is seen. No gallstones, gallbladder wall thickening, or biliary dilatation. Pancreas: Unremarkable. No pancreatic ductal dilatation or surrounding inflammatory changes. Spleen: Normal in size without focal abnormality. Adrenals/Urinary Tract: Adrenal glands are within normal limits. Kidneys demonstrate a normal  enhancement pattern bilaterally. No renal calculi are seen. A few small cysts are noted. No follow-up is recommended. The bladder is well distended. Stomach/Bowel: Scattered fecal material is noted throughout the colon without obstructive change. The appendix is not well visualized. No inflammatory changes to suggest appendicitis are noted. Small bowel and stomach are within normal limits. Vascular/Lymphatic: Aortic atherosclerosis. No enlarged abdominal or pelvic lymph nodes. Reproductive: Prostate is unremarkable. Other: No abdominal wall hernia or abnormality. No abdominopelvic ascites. Musculoskeletal: Sacral decubitus wound is noted although no bony changes are seen at this time. The wound extends to the margin of the sacrum as well as within the gluteal muscles on the right. No discrete drainable abscess is noted. IMPRESSION: CT of the chest: Small left pleural effusion and left basilar atelectasis. Mild right basilar atelectasis is noted as well. CT of the abdomen and pelvis: Sacral decubitus wound with extension into the right gluteal muscles. No definitive bony erosive changes are seen. Scattered fecal material within the colon without obstructive change. Electronically Signed   By: Alcide Clever M.D.   On: 03/04/2024 21:39   DG Chest Port 1 View Result Date: 03/04/2024 CLINICAL DATA:  Possible sepsis EXAM: PORTABLE CHEST 1 VIEW COMPARISON:  02/21/2024 FINDINGS: Cardiac shadow is stable. Tortuous thoracic aorta is again noted. The lungs are well aerated without focal infiltrate or effusion. No bony abnormality is seen. IMPRESSION: No active disease. Electronically Signed   By: Alcide Clever M.D.   On: 03/04/2024 19:59     Assessment and Plan:   Atrial fibrillation and atrial flutter with rapid ventricular response, acute on chronic Presenting with weakness and poor appetite after prolonged recent hospitalization 3/26-4/08/2024 after presenting with weakness, encephalopathy and found to have COVID-19  infection and rhabdomyolysis.  Found to have leukocytosis, significant hyperglycemia and glucosuria and a sacral wound with extension into the right gluteal muscles.  He has known paroxysmal atrial fibrillation and presents with atrial fibrillation with RVR as  well as periods of regular, narrow complex long RP tachycardia with ventricular rates in the 150s most consistent with atrial flutter.  He appears volume down to euvolemic on exam s/p over 3 L of IVF.  His atrial arrhythmias are likely related to infection, pain from the sacral wound and hypovolemia.  While he is tachycardic into the 130s-150s predominantly, his blood pressure has remained stable s/p IVF.  His ATTR amyloidosis and likely restrictive physiology in combination with reduced LVEF makes him susceptible to hypotension with tachycardia.  Therefore, I have recommended IV amiodarone for further rate/rhythm control.  He has appropriately been maintained on apixaban 5 mg twice daily for stroke prophylaxis which he states he has not missed any doses. - Agree with workup and treatment for sepsis - Volume resuscitation per admitting team, does not appear volume overloaded and likely has been appropriately volume resuscitated at this point - Would hold on beta-blockade or calcium channel blockade given hypotension and SBP in 70s (now 90-100s s/p IVF) - IV amiodarone 150 mg x 1 followed by a drip of 30 mg/h.  Continue this overnight and if in normal sinus rhythm in the morning, can stop - Continue apixaban 5mg  BID - remain on telemetry - Keep K >4 and mag >2 (please obtain magnesium) - Make NPO at MN in case DCCV is required pending clinical course over next ~12 hours  ATTR amyloidosis Chronic systolic heart failure Does not appear to be in acute decompensated heart failure.  Would hold below as stated above.  Would also hold Entresto given hypotension which he was restarted on recent discharge.  Risk Assessment/Risk Scores:        New York   Heart Association (NYHA) Functional Class NYHA Class III  CHA2DS2-VASc Score =   6  This indicates a 10 % annual risk of stroke. The patient's score is based upon:           For questions or updates, please contact [facility] Please consult [email or web address] for contact info under    Signed, Chandler Combs, MD  03/04/2024 10:34 PM

## 2024-03-04 NOTE — ED Triage Notes (Signed)
 Pt BIB [facility] from [facility] due to pain in sacral area and shortness of breath.  Pt was discharged from hospital 4 days ago to [facility].  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  22g right forearm.  600ml LR and 650 tylenol.

## 2024-03-04 NOTE — Sepsis Progress Note (Signed)
 Elink following for sepsis protocol.

## 2024-03-04 NOTE — Progress Notes (Signed)
 ED Pharmacy Antibiotic Sign Off An antibiotic consult was received from an ED provider for vancomycin and aztreonam per pharmacy dosing for sepsis. A chart review was completed to assess appropriateness.  Per protocol, chart reviewed. Patient has received at least one dose of cephalosporin in the past per chart records. Will change aztreonam to cefepime.  The following one time order(s) were placed per pharmacy consult:  cefepime 2000 mg x 1 dose vancomycin 1500 mg x 1 dose  Further antibiotic and/or antibiotic pharmacy consults should be ordered by the admitting provider if indicated.   Thank you for allowing pharmacy to be a part of this patient's care.   Dionicio Fray, PharmD, BCPS 03/04/2024 5:24 PM ED Clinical Pharmacist -  [phone]

## 2024-03-04 NOTE — Progress Notes (Signed)
 Pharmacy Antibiotic Note  Earl [name] is a 83 y.o. male for which pharmacy has been consulted for cefepime and vancomycin dosing for sepsis.  Patient with a history of AF, bradycardia, T2DM, HTN, HLD, CVA w/ rt-sided weakness, MGUS, hypothyroidism, transthyretin associated amyloid cardiomyopathy, HF, chronic pancytopenia. Patient presenting with SOB and sacral pain.  SCr 1.02 WBC 13.3; LA 2.3; T 97.8; HR 146; RR 24 COVID-19 +2 weeks ago / flu neg  Plan: Cefepime 2g q12hr  Vancomycin 1500 mg once then 1250 mg q24hr (eAUC 514.1) unless change in renal function Monitor WBC, fever, renal function, cultures De-escalate when able Levels at steady state  Height: 5\' 11"  (180.3 cm) Weight: 68.5 kg (151 lb) IBW/kg (Calculated) : 75.3  Temp (24hrs), Avg:97.8 F (36.6 C), Min:97.8 F (36.6 C), Max:97.8 F (36.6 C)  Recent Labs  Lab 02/27/24 0949 03/04/24 1706 03/04/24 1744 03/04/24 2004  WBC 8.9 13.3*  --   --   CREATININE 0.96 1.02  --   --   LATICACIDVEN  --   --  2.8* 2.3*    Estimated Creatinine Clearance: 54.1 mL/min (by C-G formula based on SCr of 1.02 mg/dL).    Allergies  Allergen Reactions   Penicillins Other (See Comments)    Did it involve swelling of the face/tongue/throat, SOB, or low BP? Unknown Did it involve sudden or severe rash/hives, skin peeling, or any reaction on the inside of your mouth or nose? Unknown Did you need to seek medical attention at a hospital or doctor's office? No When did it last happen?    Over 54 Years Ago   If all above answers are "NO", may proceed with cephalosporin use.     Microbiology results: Pending  Thank you for allowing pharmacy to be a part of this patient's care.  Dionicio Fray, PharmD, BCPS 03/04/2024 9:55 PM ED Clinical Pharmacist -  [phone]

## 2024-03-04 NOTE — Progress Notes (Signed)
 Patient is persistently hypotensive even with 4 L out IV fluid resuscitation, albumin 50 g and currently on LR. A-fib RVR/atrial flutter status post Amio bolus currently on drip. - Consulted PCCM Dr. Charon Copper.  Will evaluate patient soon.

## 2024-03-04 NOTE — ED Provider Notes (Signed)
 [place] EMERGENCY DEPARTMENT AT [facility] Provider Note   CSN: [id number] Arrival date & time: 03/04/24  1641     History  No chief complaint on file.  HPI  Earl [name] is a 83 y.o. male with past medical history type 2 diabetes, hypertension, hyperlipidemia, CHF, A-fib on Eliquis, history of stroke with residual right-sided weakness, MGUS, GERD, BPH, hypothyroidism presents due to back pain.  Patient was recently discharged from the hospital on 02/29/2024 for generalized weakness and encephalopathy.  Since discharge, he has had pain to his sacrum at his facility.  Mental status had improved and patient after stopping baclofen.  Denies any chest pain, shortness of breath, abdominal pain, or dysuria.  However, he has been feeling short of breath over the last few days though not during the time of my exam.     Home Medications Prior to Admission medications   Medication Sig Start Date End Date Taking? Authorizing Provider  Amino Acids-Protein Hydrolys (PRO-STAT AWC) LIQD Take 30 mLs by mouth in the morning and at bedtime.   Yes [provider]  atorvastatin (LIPITOR) 40 MG tablet Take  1 tablet  Daily for Cholesterol                                                       /                                  TAKE                                                       BY                                           MOUTH 01/11/24  Yes Worthy Rancher B, FNP  Cholecalciferol (VITAMIN D PO) Take 1 tablet by mouth daily.    Yes [provider]  Cyanocobalamin (B-12) 50 MCG TABS Take 50 mcg by mouth daily.   Yes [provider]  ELIQUIS 5 MG TABS tablet TAKE 1 TABLET BY MOUTH TWICE  DAILY TO PREVENT BLOOD CLOTS 11/09/23  Yes [name], FNP  Emollient (AQUAPHOR ADV PROTECT HEALING) OINT Apply small amount to two areas on buttocks daily to prevent breakdown 07/19/23  Yes [name], FNP  ferrous sulfate 325 (65 FE) MG tablet Take 325 mg by mouth  at bedtime.   Yes [provider]  finasteride (PROSCAR) 5 MG tablet TAKE 1 TABLET BY MOUTH DAILY FOR PROSTATE 09/15/23  Yes [name], FNP  levothyroxine (SYNTHROID) 50 MCG tablet TAKE 1 TABLET BY MOUTH DAILY ON  AN EMPTY STOMACH WITH ONLY WATER FOR 30 MINUTES AND NO ANTACID,  MEDS, CALCIUM OR MAGNESIUM FOR 4 HOURS AND AVOID BIOTIN Patient taking differently: Take 50 mcg by mouth daily before breakfast. ON AN EMPTY STOMACH WITH ONLY WATER FOR 30 MINUTES AND NO ANTACID,  MEDS, CALCIUM OR MAGNESIUM FOR 4 HOURS AND AVOID BIOTIN 05/23/23  Yes [name], FNP  Magnesium 250 MG TABS Take 250 mg by mouth daily.    Yes [provider]  metFORMIN (GLUCOPHAGE-XR) 500 MG 24 hr tablet TAKE 2 TABLETS BY MOUTH TWICE  DAILY WITH MEALS FOR DIABETES 07/31/23  Yes [name], FNP  metoprolol succinate (TOPROL-XL) 25 MG 24 hr tablet Take 1 tablet (25 mg total) by mouth at bedtime. 02/29/24 03/30/24 Yes Hughie Closs, MD  Multiple Vitamin (MULTIVITAMIN) capsule Take 1 capsule by mouth daily.   Yes [provider]  Nutritional Supplements (BOOST GLUCOSE CONTROL) LIQD Take 237 mLs by mouth 2 (two) times daily after a meal.   Yes [provider]  potassium chloride SA (KLOR-CON M20) 20 MEQ tablet Take 1 tablet by mouth once daily 09/25/23  Yes Milford, Jessica M, FNP  sacubitril-valsartan (ENTRESTO) 24-26 MG Take 1 tablet by mouth 2 (two) times daily. 01/31/24  Yes Bensimhon, Bevelyn Buckles, MD  Tafamidis [facility]) 61 MG CAPS Take 1 capsule by mouth daily. 04/12/23  Yes Laurey Morale, MD  vitamin C (ASCORBIC ACID) 500 MG tablet Take 500 mg by mouth daily.   Yes [provider]  AMVUTTRA 25 MG/0.5ML syringe INJECT 25MG  (0.5ML) SUBCUTANEOUSLY ONCE EVERY 3 MONTHS. ROTATE ADMINISTRATION SITE WITH EACH INJECTION. *REFRIGERATE. DO NOT SHAKE.* Patient not taking: Reported on 03/04/2024 10/24/23   Laurey Morale, MD  Blood Glucose Monitoring Suppl (ONE TOUCH ULTRA 2) w/Device  KIT Check blood sugar 1 time a day-DX-E11.9 03/23/23   [name], MD  clotrimazole-betamethasone (LOTRISONE) cream Apply to affected area 2 to 4 x / day Patient not taking: Reported on 03/04/2024 05/17/23 05/16/24  [name], FNP  glucose blood [facility] ULTRA) test strip Use as instructed 03/23/23   [name], MD  Lancets [facility] DELICA PLUS [place]) MISC CHECK BLOOD SUGAR ONCE A DAY. 03/23/23   [name], MD  Lancets Misc. (ONE TOUCH SURESOFT) MISC Check blood sugar 1 time a day-DX-E11.91 03/23/23   [name], MD  NONFORMULARY OR COMPOUNDED ITEM Please dispense a Rolling Walker.  Dx:R26.81 & G63 12/08/22   Nita Sickle K, DO  trolamine salicylate (ASPERCREME) 10 % cream Apply 1 application topically daily as needed for muscle pain. Patient not taking: Reported on 03/04/2024    [provider]  VITAMIN A PO Take by mouth. 3000 mg Patient not taking: Reported on 03/04/2024    [provider]      Allergies    Penicillins    Review of Systems   Review of Systems  Physical Exam Updated Vital Signs BP (!) 88/51   Pulse (!) 147   Temp 98 F (36.7 C) (Oral)   Resp (!) 26   Ht 5\' 11"  (1.803 m)   Wt 68.5 kg   SpO2 99%   BMI 21.06 kg/m  Physical Exam Vitals and nursing note reviewed.  Constitutional:      General: He is not in acute distress.    Appearance: He is well-developed.     Comments: Slightly diaphoretic  HENT:     Head: Normocephalic and atraumatic.  Eyes:     Conjunctiva/sclera: Conjunctivae normal.  Cardiovascular:     Rate and Rhythm: Tachycardia present. Rhythm irregular.     Heart sounds: No murmur heard. Pulmonary:     Effort: Pulmonary effort is normal. No respiratory distress.     Breath sounds: Normal breath sounds. No wheezing or rales.  Abdominal:     General: Abdomen is flat.  Palpations: Abdomen is soft.     Tenderness: There is no abdominal tenderness. There is no guarding or rebound.   Genitourinary:    Comments: No inguinal erythema or skin changes, deep sacral ulcer noted with no purulence Musculoskeletal:        General: No swelling.     Cervical back: Neck supple.  Skin:    General: Skin is warm and dry.     Capillary Refill: Capillary refill takes less than 2 seconds.  Neurological:     Mental Status: He is alert.  Psychiatric:        Mood and Affect: Mood normal.     ED Results / Procedures / Treatments   Labs (all labs ordered are listed, but only abnormal results are displayed) Labs Reviewed  RESP PANEL BY RT-PCR (RSV, FLU A&B, COVID)  RVPGX2 - Abnormal; Notable for the following components:      Result Value   SARS Coronavirus 2 by RT PCR POSITIVE (*)    All other components within normal limits  CBC WITH DIFFERENTIAL/PLATELET - Abnormal; Notable for the following components:   WBC 13.3 (*)    RBC 3.66 (*)    Hemoglobin 10.8 (*)    HCT 30.8 (*)    Neutro Abs 10.8 (*)    Abs Immature Granulocytes 0.10 (*)    All other components within normal limits  COMPREHENSIVE METABOLIC PANEL WITH GFR - Abnormal; Notable for the following components:   Sodium 130 (*)    CO2 21 (*)    Glucose, Bld 500 (*)    BUN 28 (*)    Calcium 7.8 (*)    Total Protein 4.9 (*)    Albumin <1.5 (*)    AST 60 (*)    All other components within normal limits  PROTIME-INR - Abnormal; Notable for the following components:   Prothrombin Time 19.7 (*)    INR 1.6 (*)    All other components within normal limits  APTT - Abnormal; Notable for the following components:   aPTT 54 (*)    All other components within normal limits  LACTIC ACID, PLASMA - Abnormal; Notable for the following components:   Lactic Acid, Venous 2.8 (*)    All other components within normal limits  LACTIC ACID, PLASMA - Abnormal; Notable for the following components:   Lactic Acid, Venous 2.3 (*)    All other components within normal limits  URINALYSIS, W/ REFLEX TO CULTURE (INFECTION SUSPECTED) -  Abnormal; Notable for the following components:   Color, Urine STRAW (*)    Glucose, UA >=500 (*)    Hgb urine dipstick SMALL (*)    Protein, ur 30 (*)    All other components within normal limits  CBG MONITORING, ED - Abnormal; Notable for the following components:   Glucose-Capillary 412 (*)    All other components within normal limits  CULTURE, BLOOD (ROUTINE X 2)  CULTURE, BLOOD (ROUTINE X 2)  LACTIC ACID, PLASMA  LACTIC ACID, PLASMA  LACTIC ACID, PLASMA  LACTIC ACID, PLASMA  COMPREHENSIVE METABOLIC PANEL WITH GFR  CBC    EKG EKG Interpretation Date/Time:  Monday March 04 2024 17:43:54 EDT Ventricular Rate:  146 PR Interval:  81 QRS Duration:  79 QT Interval:  322 QTC Calculation: 502 R Axis:   -25  Text Interpretation: Supraventricular tachycardia Borderline left axis deviation Borderline low voltage, extremity leads Repolarization abnormality, prob rate related Confirmed by Florentino Hurdle [phone]) on 03/04/2024 7:29:47 PM  Radiology CT CHEST ABDOMEN PELVIS  W CONTRAST Result Date: 03/04/2024 CLINICAL DATA:  Possible sepsis and sacral wound EXAM: CT CHEST, ABDOMEN, AND PELVIS WITH CONTRAST TECHNIQUE: Multidetector CT imaging of the chest, abdomen and pelvis was performed following the standard protocol during bolus administration of intravenous contrast. RADIATION DOSE REDUCTION: This exam was performed according to the departmental dose-optimization program which includes automated exposure control, adjustment of the mA and/or kV according to patient size and/or use of iterative reconstruction technique. CONTRAST:  75mL OMNIPAQUE IOHEXOL 350 MG/ML SOLN COMPARISON:  None Available. FINDINGS: CT CHEST FINDINGS Cardiovascular: Atherosclerotic calcifications of the thoracic aorta are noted without aneurysmal dilatation or dissection. Heart is mildly enlarged in size. The pulmonary artery shows a normal branching pattern bilaterally. No central pulmonary embolus is noted although not  timed for embolus evaluation. Mediastinum/Nodes: The esophagus is within normal limits. No hilar or mediastinal adenopathy is noted. A large peripherally enhancing and partially calcified nodule is noted in the left lobe thyroid measuring up to 5 cm. This has been previously biopsied and no further follow-up is recommended. Lipoma of the strap muscles on the left is again seen and stable. Lungs/Pleura: The lungs are well aerated bilaterally. Small left pleural effusion and left basilar atelectasis is noted. Minimal right basilar atelectasis is seen as well. No focal confluent infiltrate is noted. No focal nodule is seen. Musculoskeletal: Degenerative changes of the thoracic spine are noted. No acute bony abnormality is seen. CT ABDOMEN PELVIS FINDINGS Hepatobiliary: No focal liver abnormality is seen. No gallstones, gallbladder wall thickening, or biliary dilatation. Pancreas: Unremarkable. No pancreatic ductal dilatation or surrounding inflammatory changes. Spleen: Normal in size without focal abnormality. Adrenals/Urinary Tract: Adrenal glands are within normal limits. Kidneys demonstrate a normal enhancement pattern bilaterally. No renal calculi are seen. A few small cysts are noted. No follow-up is recommended. The bladder is well distended. Stomach/Bowel: Scattered fecal material is noted throughout the colon without obstructive change. The appendix is not well visualized. No inflammatory changes to suggest appendicitis are noted. Small bowel and stomach are within normal limits. Vascular/Lymphatic: Aortic atherosclerosis. No enlarged abdominal or pelvic lymph nodes. Reproductive: Prostate is unremarkable. Other: No abdominal wall hernia or abnormality. No abdominopelvic ascites. Musculoskeletal: Sacral decubitus wound is noted although no bony changes are seen at this time. The wound extends to the margin of the sacrum as well as within the gluteal muscles on the right. No discrete drainable abscess is noted.  IMPRESSION: CT of the chest: Small left pleural effusion and left basilar atelectasis. Mild right basilar atelectasis is noted as well. CT of the abdomen and pelvis: Sacral decubitus wound with extension into the right gluteal muscles. No definitive bony erosive changes are seen. Scattered fecal material within the colon without obstructive change. Electronically Signed   By: Alcide Clever M.D.   On: 03/04/2024 21:39   DG Chest Port 1 View Result Date: 03/04/2024 CLINICAL DATA:  Possible sepsis EXAM: PORTABLE CHEST 1 VIEW COMPARISON:  02/21/2024 FINDINGS: Cardiac shadow is stable. Tortuous thoracic aorta is again noted. The lungs are well aerated without focal infiltrate or effusion. No bony abnormality is seen. IMPRESSION: No active disease. Electronically Signed   By: Alcide Clever M.D.   On: 03/04/2024 19:59    Procedures Procedures    Medications Ordered in ED Medications  lactated ringers infusion (150 mL/hr Intravenous Restarted 03/04/24 2320)  metroNIDAZOLE (FLAGYL) IVPB 500 mg (0 mg Intravenous Stopped 03/04/24 1929)  atorvastatin (LIPITOR) tablet 40 mg (40 mg Oral Given 03/04/24 2219)  levothyroxine (SYNTHROID) tablet 50  mcg (has no administration in time range)  finasteride (PROSCAR) tablet 5 mg (has no administration in time range)  apixaban (ELIQUIS) tablet 5 mg (5 mg Oral Given 03/04/24 2219)  ferrous sulfate tablet 325 mg (325 mg Oral Given 03/04/24 2219)  sodium chloride flush (NS) 0.9 % injection 3 mL (3 mLs Intravenous Not Given 03/04/24 2158)  sodium chloride flush (NS) 0.9 % injection 3 mL (has no administration in time range)  0.9 %  sodium chloride infusion (has no administration in time range)  ondansetron (ZOFRAN) tablet 4 mg (has no administration in time range)    Or  ondansetron (ZOFRAN) injection 4 mg (has no administration in time range)  ceFEPIme (MAXIPIME) 2 g in sodium chloride 0.9 % 100 mL IVPB (has no administration in time range)  Vancomycin (VANCOCIN) 1,250 mg in  sodium chloride 0.9 % 250 mL IVPB (has no administration in time range)  acetaminophen (TYLENOL) tablet 650 mg (has no administration in time range)    Or  acetaminophen (TYLENOL) suppository 650 mg (has no administration in time range)  amiodarone (NEXTERONE) 1.8 mg/mL load via infusion 150 mg (has no administration in time range)    Followed by  amiodarone (NEXTERONE PREMIX) 360-4.14 MG/200ML-% (1.8 mg/mL) IV infusion (has no administration in time range)    Followed by  amiodarone (NEXTERONE PREMIX) 360-4.14 MG/200ML-% (1.8 mg/mL) IV infusion (has no administration in time range)  insulin aspart (novoLOG) injection 0-6 Units (has no administration in time range)  lactated ringers bolus 1,000 mL (0 mLs Intravenous Stopped 03/04/24 1826)    And  lactated ringers bolus 1,000 mL (0 mLs Intravenous Stopped 03/04/24 1929)    And  lactated ringers bolus 250 mL (0 mLs Intravenous Stopped 03/04/24 1944)  vancomycin (VANCOREADY) IVPB 1500 mg/300 mL (0 mg Intravenous Stopped 03/04/24 2153)  fentaNYL (SUBLIMAZE) injection 25 mcg (25 mcg Intravenous Given 03/04/24 1720)  ceFEPIme (MAXIPIME) 2 g in sodium chloride 0.9 % 100 mL IVPB (0 g Intravenous Stopped 03/04/24 1826)  iohexol (OMNIPAQUE) 350 MG/ML injection 75 mL (75 mLs Intravenous Contrast Given 03/04/24 2023)  albumin human 25 % solution 50 g (50 g Intravenous New Bag/Given 03/04/24 2217)  lactated ringers bolus 1,000 mL (0 mLs Intravenous Stopped 03/04/24 2320)  acetaminophen (OFIRMEV) IV 1,000 mg (0 mg Intravenous Stopped 03/04/24 2252)    ED Course/ Medical Decision Making/ A&P                                 Medical Decision Making Amount and/or Complexity of Data Reviewed Labs: ordered. Radiology: ordered.  Risk Prescription drug management. Decision regarding hospitalization.   Patient is alert, afebrile, tachycardic to the 140s with blood pressure 80s over 50s.  Physical exam as noted above.  Not consistent with cardiogenic shock.   Given that patient meet SIRS criteria, will collect blood cultures and empirically treat with antibiotics, broadly with Vanco, cefepime, and Flagyl.  Differential for patient's distributive shock includes infection (pneumonia, cellulitis, UTI), medication induced, tachydysrhythmia, amongst other diagnoses.  On chart review, patient's EF is adequate on his most recent admission at 50%.  Will give 30 cc/kg fluid bolus.  Plan to obtain CT imaging of the chest, abdomen, and pelvis to assess for infectious source given known decubitus ulcer.  Workup resulted with leukocytosis of 13.3, hemoglobin 10.8, CMP with mild hyponatremia 130, glucose 500 with no anion gap, BUN 28 and creatinine 1.02.  Lactate 2.8.  I  personally interpreted patient's EKG, which demonstrated tachycardia, interpreted as SVT the patient is irregular on examination and more likely appears to be atrial fibrillation versus atrial flutter with RVR.  I personally interpreted patient's chest x-ray, which demonstrated no focal consolidations concerning for pneumonia.  CT resulted with no acute changes, pleural effusions noted/atelectasis as well as decubitus ulcer with no osseous findings.  While patient was pending workup, I updated his daughter at bedside.  Patient was frequently reexamined, noted to have improvement in his blood pressure with continued tachycardia.  On reexamination after completion of fluid boluses, patient's blood pressure stable.  He is appropriate for floor admission for sepsis, can consider rate control if patient's blood pressure remains adequate.  Handoff was given to admitting team, who requested cardiology consult for rate controlling recommendations.  Patient seen in conjunction with Dr. Carylon Claude, who agreed with the above work-up and plan of care.        Final Clinical Impression(s) / ED Diagnoses Final diagnoses:  Tachycardia  Sepsis, due to unspecified organism, unspecified whether acute organ dysfunction  present [facility])    Rx / DC Orders ED Discharge Orders     None         Lorain Robson, MD 03/04/24 2322    Flonnie Humphrey, DO 03/07/24 1621

## 2024-03-04 NOTE — H&P (Addendum)
 History and Physical    Earl [name] ZOX:[id number] DOB: 06/03/1941 DOA: 03/04/2024  PCP: [name], MD   Patient coming from: SNF   Chief Complaint: No chief complaint on file.  ED TRIAGE note:  Pt BIB [facility] from [facility] due to pain in sacral area and shortness of breath.  Pt was discharged from hospital 4 days ago to [facility].  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  22g right forearm.  600ml LR and 650 tylenol.              HPI:  Earl [name] is a 83 y.o. male with medical history significant of paroxysmal atrial fibrillation, bradycardia, insulin-dependent DM type II, essential hypertension, hyperlipidemia, history of CVA residual right-sided weakness, MGUS, GERD, BPH, hypothyroidism, transthyretin associated amyloid cardiomyopathy, systolic heart failure reduced EF 40 to 45% and chronic pancytopenia presented to emergency department with complaining of shortness of breath and pain in the sacral area.  Patient was discharged home 4 days ago to [facility].  During that hospitalization patient found to have acute metabolic encephalopathy, hypotensive, hypoglycemic. Due to hypotension and development of bradycardia limited GDMT guided medication send patient was discharged home only with Entresto.  Due to labile blood glucose level long-acting insulin also was discontinued.  Patient was sent home with oral metformin.  Baclofen was discontinued as it contributed to acute metabolic encephalopathy.  Patient also found COVID-19 +2 weeks ago.  During my evaluation at the bedside patient denies any fever, chill, nausea, vomiting, abdominal pain, constipation, diarrhea, cough, chest pain or shortness of breath.  Daughter at the bedside reported that patient is complaining about significant pain and discomfort in the sacral area.  She has been noticed progressive change of the sacral area over the course of 4 to 5 days now having foul-smelling discharge.   Patient is also in significant pain and crying due to pain of the sacral area.   ED Course:  At presentation to ED patient is hypotensive 82/51, tachycardic heart rate 148, tachypnea 24 and O2 sat 95% room air. - EKG showing supraventricular tachycardia heart rate 147. -Elevated lactic acid 2.8 which has been improved to 2.3 after giving 1.5 L LR bolus - Pending blood culture.  UA. - Respiratory panel positive with COVID.   -Elevated APTT INR. -CMP showing low sodium 130, low bicarb 21, elevated blood glucose 500, normal anion gap, BUN 28, normal creatinine, low albumin 1.5 elevated AST 60. -CBC showing leukocytosis 13.6, stable H&H and normal platelet count. -Chest x-ray showing no active disease process.  In the ED patient has been given 2.5 L of LR bolus and currently on LR 150 cc/h.  Also has been treated with vancomycin, cefepime and metronidazole.  Concern for source of the infection is sacral wound/ulcer. - Pending CT chest abdomen pelvis. Physician reported at the bedside patient is having atrial fibrillation/atrial flutter.  However has not been treated yet because patient is hypotensive MAP is 60. -Requested ED physician to reach out to cardiology for consult and evaluation for the management of atrial fibrillation/flutter in the setting of hypertension.  Hospitalist has been consulted for further evaluation and management of sepsis unknown source of infection however possibly from underlying sacral wound/ulcer and atrial flutter.  Atrial fibrillation/flutter heart heart rate upper between 121-152 range and MAP is 58-60.Aaron Aas  Spoke with cardiology Dr. Hayes Lipps, recommended to give amiodarone bolus followed by continuation of drip and once heart rate would be improved can DC the drip. In setting  of hypotension unable to treat with Cardizem.    Significant labs in the ED: Lab Orders         Culture, blood (x 2)         Resp panel by RT-PCR (RSV, Flu A&B, Covid) Anterior Nasal Swab          CBC with Differential         Comprehensive metabolic panel         Protime-INR         APTT         Lactic acid, plasma         Urinalysis, w/ Reflex to Culture (Infection Suspected) -Urine, Clean Catch         Lactic acid, plasma         Comprehensive metabolic panel         CBC         Magnesium         CBG monitoring, ED       Review of Systems:  Review of Systems  Constitutional:  Negative for chills, fever, malaise/fatigue and weight loss.  Respiratory:  Negative for cough, sputum production, shortness of breath and wheezing.   Cardiovascular:  Negative for chest pain, palpitations, orthopnea, claudication and PND.  Gastrointestinal:  Negative for abdominal pain, constipation, diarrhea, heartburn, nausea and vomiting.  Genitourinary:  Negative for dysuria, frequency and urgency.  Musculoskeletal:  Positive for back pain. Negative for falls, joint pain, myalgias and neck pain.  Neurological:  Negative for dizziness and headaches.  Psychiatric/Behavioral:  The patient is not nervous/anxious.     Past Medical History:  Diagnosis Date   Adenomatous colon polyp    Allergy    Anal fissure    Arthritis    BPH (benign prostatic hyperplasia)    Diabetes mellitus (HCC)    GERD (gastroesophageal reflux disease)    Hyperlipidemia    Hypertension    Hypothyroidism    Other testicular hypofunction    Stroke [facility])     Past Surgical History:  Procedure Laterality Date   CIRCUMCISION N/A 06/11/2019   Procedure: CIRCUMCISION ADULT;  Surgeon: Christina Coyer, MD;  Location: [facility] ORS;  Service: Urology;  Laterality: N/A;   COLONOSCOPY     INGUINAL HERNIA REPAIR Bilateral    1968 and 2004   LOOP RECORDER INSERTION N/A 07/28/2017   Procedure: LOOP RECORDER INSERTION;  Surgeon: Verona Goodwill, MD;  Location: [facility] INVASIVE CV LAB;  Service: Cardiovascular;  Laterality: N/A;   RIGHT/LEFT HEART CATH AND CORONARY ANGIOGRAPHY N/A 10/07/2021   Procedure: RIGHT/LEFT HEART CATH AND  CORONARY ANGIOGRAPHY;  Surgeon: Darlis Eisenmenger, MD;  Location: [facility] INVASIVE CV LAB;  Service: Cardiovascular;  Laterality: N/A;   TEE WITHOUT CARDIOVERSION N/A 07/28/2017   Procedure: TRANSESOPHAGEAL ECHOCARDIOGRAM (TEE);  Surgeon: Jacqueline Matsu, MD;  Location: [facility] ENDOSCOPY;  Service: Cardiovascular;  Laterality: N/A;     reports that he has never smoked. He has never used smokeless tobacco. He reports that he does not drink alcohol and does not use drugs.  Allergies  Allergen Reactions   Penicillins Other (See Comments)    Did it involve swelling of the face/tongue/throat, SOB, or low BP? Unknown Did it involve sudden or severe rash/hives, skin peeling, or any reaction on the inside of your mouth or nose? Unknown Did you need to seek medical attention at a hospital or doctor's office? No When did it last happen?    Over 1 Years Ago   If  all above answers are "NO", may proceed with cephalosporin use.      Family History  Problem Relation Age of Onset   Sickle cell anemia Mother    Diabetes Father    Stomach cancer Father    Colon cancer Father    CAD Neg Hx    Esophageal cancer Neg Hx    Rectal cancer Neg Hx     Prior to Admission medications   Medication Sig Start Date End Date Taking? Authorizing Provider  Amino Acids-Protein Hydrolys (PRO-STAT AWC) LIQD Take 30 mLs by mouth in the morning and at bedtime.   Yes [provider]  atorvastatin (LIPITOR) 40 MG tablet Take  1 tablet  Daily for Cholesterol                                                       /                                  TAKE                                                       BY                                           MOUTH 01/11/24  Yes Webb, Padonda B, FNP  Cholecalciferol (VITAMIN D PO) Take 1 tablet by mouth daily.    Yes [provider]  Cyanocobalamin (B-12) 50 MCG TABS Take 50 mcg by mouth daily.   Yes [provider]  ELIQUIS 5 MG TABS tablet TAKE 1 TABLET BY MOUTH TWICE   DAILY TO PREVENT BLOOD CLOTS 11/09/23  Yes Wilkinson, Dana E, FNP  Emollient (AQUAPHOR ADV PROTECT HEALING) OINT Apply small amount to two areas on buttocks daily to prevent breakdown 07/19/23  Yes Wilkinson, Dana E, FNP  ferrous sulfate 325 (65 FE) MG tablet Take 325 mg by mouth at bedtime.   Yes [provider]  finasteride (PROSCAR) 5 MG tablet TAKE 1 TABLET BY MOUTH DAILY FOR PROSTATE 09/15/23  Yes Wilkinson, Dana E, FNP  levothyroxine (SYNTHROID) 50 MCG tablet TAKE 1 TABLET BY MOUTH DAILY ON  AN EMPTY STOMACH WITH ONLY WATER FOR 30 MINUTES AND NO ANTACID,  MEDS, CALCIUM OR MAGNESIUM FOR 4 HOURS AND AVOID BIOTIN Patient taking differently: Take 50 mcg by mouth daily before breakfast. ON AN EMPTY STOMACH WITH ONLY WATER FOR 30 MINUTES AND NO ANTACID,  MEDS, CALCIUM OR MAGNESIUM FOR 4 HOURS AND AVOID BIOTIN 05/23/23  Yes Wilkinson, Dana E, FNP  Magnesium 250 MG TABS Take 250 mg by mouth daily.    Yes [provider]  metFORMIN (GLUCOPHAGE-XR) 500 MG 24 hr tablet TAKE 2 TABLETS BY MOUTH TWICE  DAILY WITH MEALS FOR DIABETES 07/31/23  Yes Wilkinson, Dana E, FNP  metoprolol succinate (TOPROL-XL) 25 MG 24 hr tablet Take 1 tablet (25 mg total) by mouth at bedtime. 02/29/24 03/30/24 Yes Modena Andes, MD  Multiple Vitamin (MULTIVITAMIN)  capsule Take 1 capsule by mouth daily.   Yes [provider]  Nutritional Supplements (BOOST GLUCOSE CONTROL) LIQD Take 237 mLs by mouth 2 (two) times daily after a meal.   Yes [provider]  potassium chloride SA (KLOR-CON M20) 20 MEQ tablet Take 1 tablet by mouth once daily 09/25/23  Yes Milford, Jessica M, FNP  sacubitril-valsartan (ENTRESTO) 24-26 MG Take 1 tablet by mouth 2 (two) times daily. 01/31/24  Yes Bensimhon, Bevelyn Buckles, MD  Tafamidis [facility]) 61 MG CAPS Take 1 capsule by mouth daily. 04/12/23  Yes Laurey Morale, MD  vitamin C (ASCORBIC ACID) 500 MG tablet Take 500 mg by mouth daily.   Yes [provider]  AMVUTTRA 25  MG/0.5ML syringe INJECT 25MG  (0.5ML) SUBCUTANEOUSLY ONCE EVERY 3 MONTHS. ROTATE ADMINISTRATION SITE WITH EACH INJECTION. *REFRIGERATE. DO NOT SHAKE.* 10/24/23   Laurey Morale, MD  Blood Glucose Monitoring Suppl (ONE TOUCH ULTRA 2) w/Device KIT Check blood sugar 1 time a day-DX-E11.9 03/23/23   [name], MD  clotrimazole-betamethasone (LOTRISONE) cream Apply to affected area 2 to 4 x / day 05/17/23 05/16/24  [name], FNP  glucose blood (ONETOUCH ULTRA) test strip Use as instructed 03/23/23   [name], MD  Lancets [facility] DELICA PLUS [place]) MISC CHECK BLOOD SUGAR ONCE A DAY. 03/23/23   [name], MD  Lancets Misc. (ONE TOUCH SURESOFT) MISC Check blood sugar 1 time a day-DX-E11.91 03/23/23   [name], MD  NONFORMULARY OR COMPOUNDED ITEM Please dispense a Rolling Walker.  Dx:R26.81 & G63 12/08/22   Nita Sickle K, DO  trolamine salicylate (ASPERCREME) 10 % cream Apply 1 application topically daily as needed for muscle pain. Patient not taking: Reported on 03/04/2024    [provider]  VITAMIN A PO Take by mouth. 3000 mg    [provider]     Physical Exam: Vitals:   03/04/24 2235 03/04/24 2240 03/04/24 2315 03/04/24 2330  BP: (!) 99/54 95/65 (!) 88/51 (!) 89/57  Pulse: (!) 152 (!) 151 (!) 147 (!) 138  Resp: (!) 28 20 (!) 26 (!) 24  Temp:      TempSrc:      SpO2: 97% 97% 99% 98%  Weight:      Height:        Physical Exam Vitals and nursing note reviewed.  Constitutional:      Appearance: He is ill-appearing.     Comments: Patient is having significant pain of the sacral area crying with pain.  HENT:     Mouth/Throat:     Mouth: Mucous membranes are moist.  Cardiovascular:     Rate and Rhythm: Tachycardia present. Rhythm irregular.     Pulses: Normal pulses.     Heart sounds: Normal heart sounds.  Pulmonary:     Effort: Pulmonary effort is normal.     Breath sounds: Normal breath sounds.  Abdominal:     General: Bowel  sounds are normal.  Musculoskeletal:     Cervical back: Neck supple.     Right lower leg: No edema.     Left lower leg: No edema.  Skin:    General: Skin is dry.     Capillary Refill: Capillary refill takes less than 2 seconds.     Comments: Sacral area stage II/III wound with serosanguineous discharge  Neurological:     Mental Status: He is alert and oriented to person, place, and time.     Cranial Nerves: Cranial nerve deficit present.  Motor: Weakness present.     Comments: Residual right-sided weakness from previous CVA.  Psychiatric:        Mood and Affect: Mood normal.        Thought Content: Thought content normal.      Media Information   Document Information  Photos    03/04/2024 22:00  Attached To:  Hospital Encounter on 03/04/24  Source Information  Jetty Mort, MD  [facility]  Document History     Labs on Admission: I have personally reviewed following labs and imaging studies  CBC: Recent Labs  Lab 02/27/24 0949 03/04/24 1706  WBC 8.9 13.3*  NEUTROABS 6.5 10.8*  HGB 12.4* 10.8*  HCT 36.6* 30.8*  MCV 86.3 84.2  PLT 339 378   Basic Metabolic Panel: Recent Labs  Lab 02/27/24 0949 03/04/24 1706  NA 139 130*  K 4.1 5.0  CL 105 100  CO2 23 21*  GLUCOSE 125* 500*  BUN 17 28*  CREATININE 0.96 1.02  CALCIUM 8.1* 7.8*   GFR: Estimated Creatinine Clearance: 54.1 mL/min (by C-G formula based on SCr of 1.02 mg/dL). Liver Function Tests: Recent Labs  Lab 03/04/24 1706  AST 60*  ALT 40  ALKPHOS 88  BILITOT 0.5  PROT 4.9*  ALBUMIN <1.5*   No results for input(s): "LIPASE", "AMYLASE" in the last 168 hours. No results for input(s): "AMMONIA" in the last 168 hours. Coagulation Profile: Recent Labs  Lab 03/04/24 1706  INR 1.6*   Cardiac Enzymes: Recent Labs  Lab 02/27/24 0949  CKTOTAL 501*   BNP (last 3 results) Recent Labs    08/23/23 1541  BNP 99.2   HbA1C: No results for input(s): "HGBA1C" in the last 72  hours. CBG: Recent Labs  Lab 02/28/24 1118 02/28/24 1723 02/28/24 2114 02/29/24 0637 03/04/24 2244  GLUCAP 254* 288* 300* 120* 412*   Lipid Profile: No results for input(s): "CHOL", "HDL", "LDLCALC", "TRIG", "CHOLHDL", "LDLDIRECT" in the last 72 hours. Thyroid Function Tests: No results for input(s): "TSH", "T4TOTAL", "FREET4", "T3FREE", "THYROIDAB" in the last 72 hours. Anemia Panel: No results for input(s): "VITAMINB12", "FOLATE", "FERRITIN", "TIBC", "IRON", "RETICCTPCT" in the last 72 hours. Urine analysis:    Component Value Date/Time   COLORURINE STRAW (A) 03/04/2024 2139   APPEARANCEUR CLEAR 03/04/2024 2139   LABSPEC 1.016 03/04/2024 2139   PHURINE 6.0 03/04/2024 2139   GLUCOSEU >=500 (A) 03/04/2024 2139   HGBUR SMALL (A) 03/04/2024 2139   BILIRUBINUR NEGATIVE 03/04/2024 2139   KETONESUR NEGATIVE 03/04/2024 2139   PROTEINUR 30 (A) 03/04/2024 2139   UROBILINOGEN 0.2 05/05/2015 1443   NITRITE NEGATIVE 03/04/2024 2139   LEUKOCYTESUR NEGATIVE 03/04/2024 2139    Radiological Exams on Admission: I have personally reviewed images CT CHEST ABDOMEN PELVIS W CONTRAST Result Date: 03/04/2024 CLINICAL DATA:  Possible sepsis and sacral wound EXAM: CT CHEST, ABDOMEN, AND PELVIS WITH CONTRAST TECHNIQUE: Multidetector CT imaging of the chest, abdomen and pelvis was performed following the standard protocol during bolus administration of intravenous contrast. RADIATION DOSE REDUCTION: This exam was performed according to the departmental dose-optimization program which includes automated exposure control, adjustment of the mA and/or kV according to patient size and/or use of iterative reconstruction technique. CONTRAST:  75mL OMNIPAQUE IOHEXOL 350 MG/ML SOLN COMPARISON:  None Available. FINDINGS: CT CHEST FINDINGS Cardiovascular: Atherosclerotic calcifications of the thoracic aorta are noted without aneurysmal dilatation or dissection. Heart is mildly enlarged in size. The pulmonary artery  shows a normal branching pattern bilaterally. No central pulmonary embolus is noted although  not timed for embolus evaluation. Mediastinum/Nodes: The esophagus is within normal limits. No hilar or mediastinal adenopathy is noted. A large peripherally enhancing and partially calcified nodule is noted in the left lobe thyroid measuring up to 5 cm. This has been previously biopsied and no further follow-up is recommended. Lipoma of the strap muscles on the left is again seen and stable. Lungs/Pleura: The lungs are well aerated bilaterally. Small left pleural effusion and left basilar atelectasis is noted. Minimal right basilar atelectasis is seen as well. No focal confluent infiltrate is noted. No focal nodule is seen. Musculoskeletal: Degenerative changes of the thoracic spine are noted. No acute bony abnormality is seen. CT ABDOMEN PELVIS FINDINGS Hepatobiliary: No focal liver abnormality is seen. No gallstones, gallbladder wall thickening, or biliary dilatation. Pancreas: Unremarkable. No pancreatic ductal dilatation or surrounding inflammatory changes. Spleen: Normal in size without focal abnormality. Adrenals/Urinary Tract: Adrenal glands are within normal limits. Kidneys demonstrate a normal enhancement pattern bilaterally. No renal calculi are seen. A few small cysts are noted. No follow-up is recommended. The bladder is well distended. Stomach/Bowel: Scattered fecal material is noted throughout the colon without obstructive change. The appendix is not well visualized. No inflammatory changes to suggest appendicitis are noted. Small bowel and stomach are within normal limits. Vascular/Lymphatic: Aortic atherosclerosis. No enlarged abdominal or pelvic lymph nodes. Reproductive: Prostate is unremarkable. Other: No abdominal wall hernia or abnormality. No abdominopelvic ascites. Musculoskeletal: Sacral decubitus wound is noted although no bony changes are seen at this time. The wound extends to the margin of the  sacrum as well as within the gluteal muscles on the right. No discrete drainable abscess is noted. IMPRESSION: CT of the chest: Small left pleural effusion and left basilar atelectasis. Mild right basilar atelectasis is noted as well. CT of the abdomen and pelvis: Sacral decubitus wound with extension into the right gluteal muscles. No definitive bony erosive changes are seen. Scattered fecal material within the colon without obstructive change. Electronically Signed   By: Violeta Grey M.D.   On: 03/04/2024 21:39   DG Chest Port 1 View Result Date: 03/04/2024 CLINICAL DATA:  Possible sepsis EXAM: PORTABLE CHEST 1 VIEW COMPARISON:  02/21/2024 FINDINGS: Cardiac shadow is stable. Tortuous thoracic aorta is again noted. The lungs are well aerated without focal infiltrate or effusion. No bony abnormality is seen. IMPRESSION: No active disease. Electronically Signed   By: Violeta Grey M.D.   On: 03/04/2024 19:59     EKG: My personal interpretation of EKG shows: Atrial fibrillation/flutter heart rate 146.    Assessment/Plan: Principal Problem:   Severe sepsis (HCC) Active Problems:   Paroxysmal atrial fibrillation (HCC)   Sepsis (HCC)   Atrial flutter (HCC)   Sacral ulcer (HCC)   MGUS (monoclonal gammopathy of unknown significance)   Essential hypertension   Hyperlipidemia   Insulin dependent type 2 diabetes mellitus (HCC)   History of CVA (cerebrovascular accident)   Cardiac amyloidosis (HCC)   Chronic systolic CHF (congestive heart failure) (HCC)   GERD (gastroesophageal reflux disease)   History of hypertension   Atrial fibrillation with RVR (HCC)    Assessment and Plan: Sepsis secondary to sacral pressure ulcer wound infection 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 - Patient was discharged to [facility] rehab 4/10 after being admitted for acute metabolic  encephalopathy, hypotension, hypoglycemia and sacral ulcer wound.  Elevated lactic acid 2.8 trended up to 2.3 after giving  2.5 L of LR bolus in the ED. - At presentation to ED hypotensive, tachycardic, elevated lactic acid.  CBC showing leukocytosis. -Physical exam showed stage II-III sacral pressure ulcer and wound.  Picture on the chart. - Patient was COVID-positive associated infection without pneumonia 2 weeks ago.  Chest x-ray no evidence of pneumonia. -Concern for sepsis in the setting of sacral wound/pressure ulcer infection. - CT chest abdomen pelvis showed sacral decubitus wound with extension to right gluteus muscle.  No definitive bony erosion change are seen. - Activating code sepsis.  Continue trend lactic acid.  Giving third liter of fluid bolus and continue LR 150 cc/h.  In the setting of persistent hypotension giving albumin 50 grams.  - History of penicillin allergy.  In the ED patient has been treated with IV vancomycin, cefepime metronidazole. - Continue IV Vanco, cefepime and metronidazole coverage. - Will follow-up with blood cultures result. -Patient is in extensive pain due to sacral ulcer wound.  Avoiding narcotics in the setting of hypotension. - Giving IV Tylenol 1000 mg.  Continue Tylenol as needed. -General surgery Dr. Ramiro Burly requesting for debridement of the sacral decubitus ulcer wound.  Spoke with general surgery over phone.  Will see patient soon. - Consulting wound care for enzymatic debridement of the sacral ulcer wound.  If unsuccessful in that case general surgery will proceed with operative debridement. Addendum - General Surgery recommended possibly will debride underlying eschar but need to consult wound care for nonoperative-enzymatic debridement since patient has high risk for taking to OR.  Recommended to keep patient n.p.o. in case patient clinically deteriorate and wound care cannot manage nonoperatively; in that case general surgery will take 2 OR for  debridement. -General Surgery recommended holding Eliquis>> however patient already got Eliquis around 11 PM 4/14.    History of paroxysmal atrial fibrillation Persistent atrial fibrillation/flutter with RVR -Patient has history of paroxysmal atrial fibrillation however during last hospitalization patient developed bradycardia heart rate dropped to 52 and remained persistent bradycardic which is the reason Toprol-XL was discontinued on discharge. - Currently patient having atrial fibrillation/flutter heart rate up to 151.  -Atrial fibrillation RVR/flutter however in the setting of hypotension in the context of sepsis limiting use of Cardizem bolus and drip. -Spoke with cardiology Dr. Hayes Lipps recommended give amiodarone bolus 150 mg followed by continuation of drip 30 mg/h.  Recommended to keep patient n.p.o. in case requires DCCV over the next 12 hours. -Continue cardiac monitoring. Continue Eliquis 5 mg twice daily Addendum - General Surgery recommended holding Eliquis for tentative surgical debridement of the sacral wound either tomorrow.  However patient already received Eliquis around 11 PM 03/04/24.  Holding further Eliquis dosing.Aaron Aas   History of CHF reduced EF 40 to 45% Transthyretin cardiac amyloidosis Nonischemic cardiomyopathy Essential hypertension - Patient was significantly hypotensive during the last hospitalization and all GDMT guided medication was discontinued except at the end Entresto was initiated by cardiology team and patient blood pressure remained stable. - Currently in the setting of sepsis holding Entresto. - Continue to monitor for any fluid overload, dyspnea, check pulse ox.  Insulin-dependent DM type II Hyperglycemia without DKA -Elevated blood glucose around 500.  Low bicarb 21 however normal anion gap. - Due to labile blood glucose/blood point of recurrent hypoglycemia during last hospitalization on discharge Lantus was discontinued. -Continue to check POC  blood glucose every 4 hours and sliding scale insulin. -Patient has been resuscitated with 3.5 L of fluid in the ED.  Continue maintenance fluid. - Starting carb modified  and healthy diet. Addendum - Based on general surgery recommendation patient will be n.p.o. after midnight.  In that case changing blood glucose and insulin adjustment. -Continue to check POC blood glucose every 6 hours and low sliding scale SSI as needed accordingly.  History of CVA-with residual right-sided weakness -Currently on Eliquis and Lipitor.  GERD - Continue Protonix  Anemia of chronic disease - Continue iron supplement   Hypothyroidism - Continue levothyroxine  Stage II/III sacral ulcer - Continue to turn patient every 2 hours, dressing change. - Continue wound care and consulting inpatient wound care as well  DVT prophylaxis:  Eliquis Code Status:  DNR/DNI(Do NOT Intubate).  Patient's daughter at the bedside who is the primary decision maker and verified with daughter patient is DNR/DNI Diet: Heart healthy carb modified diet>> will be n.p.o. after midnight Family Communication:   Family was present at bedside, at the time of interview. Opportunity was given to ask question and all questions were answered satisfactorily.  Disposition Plan: Continue monitor improvement of heart rate and blood pressure.  Follow-up with blood cultures, general surgery and cardiology recommendation. Consults: Cardiology, general surgery and wound care Admission status:   Inpatient, Step Down Unit  Severity of Illness: The appropriate patient status for this patient is INPATIENT. Inpatient status is judged to be reasonable and necessary in order to provide the required intensity of service to ensure the patient's safety. The patient's presenting symptoms, physical exam findings, and initial radiographic and laboratory data in the context of their chronic comorbidities is felt to place them at high risk for further clinical  deterioration. Furthermore, it is not anticipated that the patient will be medically stable for discharge from the hospital within 2 midnights of admission.   * I certify that at the point of admission it is my clinical judgment that the patient will require inpatient hospital care spanning beyond 2 midnights from the point of admission due to high intensity of service, high risk for further deterioration and high frequency of surveillance required.Aaron Aas    Ignatius Kloos, MD [facility]  How to contact the [facility] Attending or Consulting provider 7A - 7P or covering provider during after hours 7P -7A, for this patient.  Check the care team in [facility] and look for a) attending/consulting [facility] provider listed and b) the [facility] team listed Log into [email or web address] and use [place]'s universal password to access. If you do not have the password, please contact the hospital operator. Locate the [facility] provider you are looking for under [facility] and page to a number that you can be directly reached. If you still have difficulty reaching the provider, please page the [facility] (Director on Call) for the Hospitalists listed on amion for assistance.  03/04/2024, 11:36 PM

## 2024-03-04 NOTE — Consult Note (Signed)
 Reason for Consult:  sacral decubitus ulcer Referring Provider: Janalyn Shy, MD  HPI  Earl [name] is an 83 y.o. male with history of pAfib, bradycardia, DM2, HTN, HLD, hx CVA with residual right sided weekness, GERD, BPH, thyroid disease, amyloid cardiomyopathy, HFrEF and chronic pancytopenia who presents from his rehab facility due to sacral pain, SOB, hypotension.  Patient's daughter at bedside and helped provide history. Patient recently hospitalized for several weeks for COVID and developed sacral wound at this time. Continued to worsen at rehab facility especially in last 4-5 days. Now with foul smelling drainage. Daughter has not noticed any stool in wound at any point. Denies fevers/chills, nausea/emesis. Patient also noted to be in Afib and amiodarone bolus ordered.   Patient has notable pain to sacrum. Labs notable for leukocytosis to 13.3. Elevated lactate, downtrending to 2.3 from 2.8, hyperglycemia in 400-500s. CT scan was obtained hat showes sacral wound extending into right gluteal muscles without any drainable fluid/abscess.  10 point review of systems is negative except as listed above in HPI.  Objective  Past Medical History: Past Medical History:  Diagnosis Date   Adenomatous colon polyp    Allergy    Anal fissure    Arthritis    BPH (benign prostatic hyperplasia)    Diabetes mellitus (HCC)    GERD (gastroesophageal reflux disease)    Hyperlipidemia    Hypertension    Hypothyroidism    Other testicular hypofunction    Stroke [facility])     Past Surgical History: Past Surgical History:  Procedure Laterality Date   CIRCUMCISION N/A 06/11/2019   Procedure: CIRCUMCISION ADULT;  Surgeon: Jerilee Field, MD;  Location: [facility] ORS;  Service: Urology;  Laterality: N/A;   COLONOSCOPY     INGUINAL HERNIA REPAIR Bilateral    1968 and 2004   LOOP RECORDER INSERTION N/A 07/28/2017   Procedure: LOOP RECORDER INSERTION;  Surgeon: Duke Salvia, MD;  Location: [facility] INVASIVE CV  LAB;  Service: Cardiovascular;  Laterality: N/A;   RIGHT/LEFT HEART CATH AND CORONARY ANGIOGRAPHY N/A 10/07/2021   Procedure: RIGHT/LEFT HEART CATH AND CORONARY ANGIOGRAPHY;  Surgeon: Laurey Morale, MD;  Location: [facility] INVASIVE CV LAB;  Service: Cardiovascular;  Laterality: N/A;   TEE WITHOUT CARDIOVERSION N/A 07/28/2017   Procedure: TRANSESOPHAGEAL ECHOCARDIOGRAM (TEE);  Surgeon: Quintella Reichert, MD;  Location: [facility] ENDOSCOPY;  Service: Cardiovascular;  Laterality: N/A;    Family History:  Family History  Problem Relation Age of Onset   Sickle cell anemia Mother    Diabetes Father    Stomach cancer Father    Colon cancer Father    CAD Neg Hx    Esophageal cancer Neg Hx    Rectal cancer Neg Hx     Social History:  reports that he has never smoked. He has never used smokeless tobacco. He reports that he does not drink alcohol and does not use drugs.  Allergies:  Allergies  Allergen Reactions   Penicillins Other (See Comments)    Did it involve swelling of the face/tongue/throat, SOB, or low BP? Unknown Did it involve sudden or severe rash/hives, skin peeling, or any reaction on the inside of your mouth or nose? Unknown Did you need to seek medical attention at a hospital or doctor's office? No When did it last happen?    Over 69 Years Ago   If all above answers are "NO", may proceed with cephalosporin use.      Medications: I have reviewed the patient's current medications.  Labs: I  have personally reviewed all labs for the past 24h  Imaging: I have personally reviewed and interpreted all imaging for the past 24h and agree with the radiologist's impression.  CT CHEST ABDOMEN PELVIS W CONTRAST Result Date: 03/04/2024 CLINICAL DATA:  Possible sepsis and sacral wound EXAM: CT CHEST, ABDOMEN, AND PELVIS WITH CONTRAST TECHNIQUE: Multidetector CT imaging of the chest, abdomen and pelvis was performed following the standard protocol during bolus administration of intravenous contrast.  RADIATION DOSE REDUCTION: This exam was performed according to the departmental dose-optimization program which includes automated exposure control, adjustment of the mA and/or kV according to patient size and/or use of iterative reconstruction technique. CONTRAST:  75mL OMNIPAQUE IOHEXOL 350 MG/ML SOLN COMPARISON:  None Available. FINDINGS: CT CHEST FINDINGS Cardiovascular: Atherosclerotic calcifications of the thoracic aorta are noted without aneurysmal dilatation or dissection. Heart is mildly enlarged in size. The pulmonary artery shows a normal branching pattern bilaterally. No central pulmonary embolus is noted although not timed for embolus evaluation. Mediastinum/Nodes: The esophagus is within normal limits. No hilar or mediastinal adenopathy is noted. A large peripherally enhancing and partially calcified nodule is noted in the left lobe thyroid measuring up to 5 cm. This has been previously biopsied and no further follow-up is recommended. Lipoma of the strap muscles on the left is again seen and stable. Lungs/Pleura: The lungs are well aerated bilaterally. Small left pleural effusion and left basilar atelectasis is noted. Minimal right basilar atelectasis is seen as well. No focal confluent infiltrate is noted. No focal nodule is seen. Musculoskeletal: Degenerative changes of the thoracic spine are noted. No acute bony abnormality is seen. CT ABDOMEN PELVIS FINDINGS Hepatobiliary: No focal liver abnormality is seen. No gallstones, gallbladder wall thickening, or biliary dilatation. Pancreas: Unremarkable. No pancreatic ductal dilatation or surrounding inflammatory changes. Spleen: Normal in size without focal abnormality. Adrenals/Urinary Tract: Adrenal glands are within normal limits. Kidneys demonstrate a normal enhancement pattern bilaterally. No renal calculi are seen. A few small cysts are noted. No follow-up is recommended. The bladder is well distended. Stomach/Bowel: Scattered fecal material is  noted throughout the colon without obstructive change. The appendix is not well visualized. No inflammatory changes to suggest appendicitis are noted. Small bowel and stomach are within normal limits. Vascular/Lymphatic: Aortic atherosclerosis. No enlarged abdominal or pelvic lymph nodes. Reproductive: Prostate is unremarkable. Other: No abdominal wall hernia or abnormality. No abdominopelvic ascites. Musculoskeletal: Sacral decubitus wound is noted although no bony changes are seen at this time. The wound extends to the margin of the sacrum as well as within the gluteal muscles on the right. No discrete drainable abscess is noted. IMPRESSION: CT of the chest: Small left pleural effusion and left basilar atelectasis. Mild right basilar atelectasis is noted as well. CT of the abdomen and pelvis: Sacral decubitus wound with extension into the right gluteal muscles. No definitive bony erosive changes are seen. Scattered fecal material within the colon without obstructive change. Electronically Signed   By: Violeta Grey M.D.   On: 03/04/2024 21:39   DG Chest Port 1 View Result Date: 03/04/2024 CLINICAL DATA:  Possible sepsis EXAM: PORTABLE CHEST 1 VIEW COMPARISON:  02/21/2024 FINDINGS: Cardiac shadow is stable. Tortuous thoracic aorta is again noted. The lungs are well aerated without focal infiltrate or effusion. No bony abnormality is seen. IMPRESSION: No active disease. Electronically Signed   By: Violeta Grey M.D.   On: 03/04/2024 19:59     Physical Exam Blood pressure (!) 94/52, pulse (!) 146, temperature 98 F (36.7 C),  temperature source Oral, resp. rate (!) 24, height 5\' 11"  (1.803 m), weight 68.5 kg, SpO2 97%. Constitutional: elderly ill appearing male HEENT: pupils equal, round, reactive to light, moist conjunctiva, hearing intact Oropharynx: mucous membranes dry CV: Sinus tachycardia Chest: equal chest rise bilaterally Abdomen: soft, nondistended, nontender Back: Sacral decubitus ulcer with  foul smelling but SS drainage. Tender Extremities: no peripheral edema Skin: warm, dry, no rashes Neuro: Residual right sided weakness, A&Ox3    Assessment   Earl [name] is an 83 y.o. male with sacral decubitus ulcer and sepsis  Plan  - Continue broad spectrum antibiotics - NPO with ice chips/sips - Discussed options with patient's daughter. We discussed OR for debridement versus nonoperative intervention with antibiotics and wound nursing for possible enzymatic debridement of eschar and aggressive wound care. Patient is DNR of note and given his cardiac history would be higher risk for OR. At this time daughter and patient would like to try no surgical intervention and reassess in AM. She will think about whether surgery aligns with her father's wishes and also discuss with her brother. I do think this is reasonable given no drainable abscess and wound does not appear to be necrotizing on both physical exam and on CT imaging.  - Would hold home Eliquis at this time in case surgical intervention ultimately needed if he does not improve - General surgery will continue to follow  I reviewed ED provider notes, hospitalist notes, last 24 h vitals and pain scores, last 48 h intake and output, last 24 h labs and trends, and last 24 h imaging results. Discussed plan of care directly with [facility], Dr. Sundil.  This care required moderate level of medical decision making.    Freddrick Jaffe, MD [facility] Surgery

## 2024-03-05 ENCOUNTER — Other Ambulatory Visit: Payer: Self-pay

## 2024-03-05 DIAGNOSIS — R6520 Severe sepsis without septic shock: Secondary | ICD-10-CM | POA: Diagnosis not present

## 2024-03-05 DIAGNOSIS — A419 Sepsis, unspecified organism: Secondary | ICD-10-CM | POA: Diagnosis not present

## 2024-03-05 LAB — CBC
HCT: 25.3 % — ABNORMAL LOW (ref 39.0–52.0)
Hemoglobin: 9 g/dL — ABNORMAL LOW (ref 13.0–17.0)
MCH: 29.9 pg (ref 26.0–34.0)
MCHC: 35.6 g/dL (ref 30.0–36.0)
MCV: 84.1 fL (ref 80.0–100.0)
Platelets: 270 10*3/uL (ref 150–400)
RBC: 3.01 MIL/uL — ABNORMAL LOW (ref 4.22–5.81)
RDW: 13.4 % (ref 11.5–15.5)
WBC: 10.7 10*3/uL — ABNORMAL HIGH (ref 4.0–10.5)
nRBC: 0 % (ref 0.0–0.2)

## 2024-03-05 LAB — GLUCOSE, CAPILLARY
Glucose-Capillary: 362 mg/dL — ABNORMAL HIGH (ref 70–99)
Glucose-Capillary: 312 mg/dL — ABNORMAL HIGH (ref 70–99)
Glucose-Capillary: 229 mg/dL — ABNORMAL HIGH (ref 70–99)

## 2024-03-05 LAB — HEPARIN LEVEL (UNFRACTIONATED): Heparin Unfractionated: >1.1 [IU]/mL — ABNORMAL HIGH (ref 0.30–0.70)

## 2024-03-05 LAB — COMPREHENSIVE METABOLIC PANEL WITH GFR
ALT: 31 U/L (ref 0–44)
AST: 41 U/L (ref 15–41)
Albumin: 1.9 g/dL — ABNORMAL LOW (ref 3.5–5.0)
Alkaline Phosphatase: 72 U/L (ref 38–126)
Anion gap: 6 (ref 5–15)
BUN: 18 mg/dL (ref 8–23)
CO2: 23 mmol/L (ref 22–32)
Calcium: 7.9 mg/dL — ABNORMAL LOW (ref 8.9–10.3)
Chloride: 106 mmol/L (ref 98–111)
Creatinine, Ser: 0.82 mg/dL (ref 0.61–1.24)
GFR, Estimated: >60 mL/min (ref >60)
Glucose, Bld: 416 mg/dL — ABNORMAL HIGH (ref 70–99)
Potassium: 4.2 mmol/L (ref 3.5–5.1)
Sodium: 135 mmol/L (ref 135–145)
Total Bilirubin: 0.7 mg/dL (ref 0.0–1.2)
Total Protein: 4.7 g/dL — ABNORMAL LOW (ref 6.5–8.1)

## 2024-03-05 LAB — MAGNESIUM: Magnesium: 1.7 mg/dL (ref 1.7–2.4)

## 2024-03-05 LAB — CBG MONITORING, ED: Glucose-Capillary: 406 mg/dL — ABNORMAL HIGH (ref 70–99)

## 2024-03-05 LAB — LACTIC ACID, PLASMA
Lactic Acid, Venous: 1.5 mmol/L (ref 0.5–1.9)
Lactic Acid, Venous: 2 mmol/L (ref 0.5–1.9)

## 2024-03-05 LAB — APTT: aPTT: 57 s — ABNORMAL HIGH (ref 24–36)

## 2024-03-05 MED ORDER — INSULIN ASPART 100 UNIT/ML IJ SOLN
0.0000 [IU] | Freq: Three times a day (TID) | INTRAMUSCULAR | Status: DC
  Administered 2024-03-05: 15 [IU] via SUBCUTANEOUS
  Administered 2024-03-05: 11 [IU] via SUBCUTANEOUS
  Administered 2024-03-06: 5 [IU] via SUBCUTANEOUS
  Administered 2024-03-06: 8 [IU] via SUBCUTANEOUS
  Administered 2024-03-06 – 2024-03-07 (×2): 11 [IU] via SUBCUTANEOUS
  Administered 2024-03-07: 5 [IU] via SUBCUTANEOUS
  Administered 2024-03-08: 3 [IU] via SUBCUTANEOUS
  Administered 2024-03-08: 5 [IU] via SUBCUTANEOUS
  Administered 2024-03-08: 8 [IU] via SUBCUTANEOUS
  Administered 2024-03-09: 5 [IU] via SUBCUTANEOUS
  Administered 2024-03-09 (×2): 11 [IU] via SUBCUTANEOUS
  Administered 2024-03-10: 8 [IU] via SUBCUTANEOUS
  Administered 2024-03-10: 3 [IU] via SUBCUTANEOUS
  Administered 2024-03-10: 8 [IU] via SUBCUTANEOUS
  Administered 2024-03-11: 5 [IU] via SUBCUTANEOUS
  Administered 2024-03-11: 8 [IU] via SUBCUTANEOUS
  Administered 2024-03-11: 3 [IU] via SUBCUTANEOUS
  Administered 2024-03-12: 11 [IU] via SUBCUTANEOUS
  Administered 2024-03-12 (×2): 3 [IU] via SUBCUTANEOUS
  Administered 2024-03-13: 8 [IU] via SUBCUTANEOUS
  Administered 2024-03-13 – 2024-03-14 (×3): 3 [IU] via SUBCUTANEOUS
  Administered 2024-03-14: 8 [IU] via SUBCUTANEOUS
  Administered 2024-03-14: 3 [IU] via SUBCUTANEOUS
  Administered 2024-03-15: 5 [IU] via SUBCUTANEOUS
  Administered 2024-03-15 – 2024-03-16 (×2): 3 [IU] via SUBCUTANEOUS
  Administered 2024-03-16: 2 [IU] via SUBCUTANEOUS
  Administered 2024-03-16 – 2024-03-17 (×2): 3 [IU] via SUBCUTANEOUS
  Administered 2024-03-17: 8 [IU] via SUBCUTANEOUS
  Administered 2024-03-17: 2 [IU] via SUBCUTANEOUS
  Administered 2024-03-18: 8 [IU] via SUBCUTANEOUS
  Administered 2024-03-18: 2 [IU] via SUBCUTANEOUS
  Administered 2024-03-18: 8 [IU] via SUBCUTANEOUS
  Administered 2024-03-19 (×2): 5 [IU] via SUBCUTANEOUS
  Administered 2024-03-19: 3 [IU] via SUBCUTANEOUS
  Administered 2024-03-20 (×2): 5 [IU] via SUBCUTANEOUS
  Administered 2024-03-21: 2 [IU] via SUBCUTANEOUS
  Administered 2024-03-21 – 2024-03-22 (×3): 5 [IU] via SUBCUTANEOUS
  Administered 2024-03-22: 2 [IU] via SUBCUTANEOUS
  Administered 2024-03-22 – 2024-03-24 (×5): 3 [IU] via SUBCUTANEOUS
  Administered 2024-03-24 – 2024-03-25 (×2): 2 [IU] via SUBCUTANEOUS
  Administered 2024-03-26 – 2024-03-27 (×2): 3 [IU] via SUBCUTANEOUS
  Administered 2024-03-27: 8 [IU] via SUBCUTANEOUS
  Administered 2024-03-28: 3 [IU] via SUBCUTANEOUS
  Administered 2024-03-29: 5 [IU] via SUBCUTANEOUS
  Administered 2024-03-30: 3 [IU] via SUBCUTANEOUS
  Administered 2024-03-30: 5 [IU] via SUBCUTANEOUS
  Administered 2024-03-31 (×2): 3 [IU] via SUBCUTANEOUS
  Administered 2024-04-01: 5 [IU] via SUBCUTANEOUS
  Administered 2024-04-01: 2 [IU] via SUBCUTANEOUS
  Administered 2024-04-02: 3 [IU] via SUBCUTANEOUS
  Administered 2024-04-02: 5 [IU] via SUBCUTANEOUS
  Administered 2024-04-03: 3 [IU] via SUBCUTANEOUS
  Administered 2024-04-03: 5 [IU] via SUBCUTANEOUS
  Administered 2024-04-03 – 2024-04-04 (×2): 3 [IU] via SUBCUTANEOUS
  Administered 2024-04-04: 2 [IU] via SUBCUTANEOUS
  Administered 2024-04-04 – 2024-04-05 (×2): 3 [IU] via SUBCUTANEOUS
  Administered 2024-04-05: 2 [IU] via SUBCUTANEOUS
  Administered 2024-04-06: 3 [IU] via SUBCUTANEOUS
  Administered 2024-04-06: 5 [IU] via SUBCUTANEOUS
  Administered 2024-04-07: 3 [IU] via SUBCUTANEOUS
  Administered 2024-04-07: 2 [IU] via SUBCUTANEOUS
  Administered 2024-04-08: 5 [IU] via SUBCUTANEOUS
  Administered 2024-04-08 – 2024-04-10 (×3): 3 [IU] via SUBCUTANEOUS
  Administered 2024-04-10: 5 [IU] via SUBCUTANEOUS
  Administered 2024-04-11 (×2): 3 [IU] via SUBCUTANEOUS
  Administered 2024-04-12 (×2): 5 [IU] via SUBCUTANEOUS
  Administered 2024-04-13: 2 [IU] via SUBCUTANEOUS
  Administered 2024-04-13 – 2024-04-15 (×2): 3 [IU] via SUBCUTANEOUS
  Administered 2024-04-15: 2 [IU] via SUBCUTANEOUS
  Administered 2024-04-15 – 2024-04-16 (×2): 3 [IU] via SUBCUTANEOUS
  Administered 2024-04-16 – 2024-04-18 (×5): 2 [IU] via SUBCUTANEOUS
  Administered 2024-04-18: 3 [IU] via SUBCUTANEOUS
  Administered 2024-04-18 – 2024-04-19 (×3): 2 [IU] via SUBCUTANEOUS
  Administered 2024-04-19: 5 [IU] via SUBCUTANEOUS
  Administered 2024-04-20 (×2): 2 [IU] via SUBCUTANEOUS
  Administered 2024-04-20: 3 [IU] via SUBCUTANEOUS
  Administered 2024-04-21: 2 [IU] via SUBCUTANEOUS
  Administered 2024-04-21 – 2024-04-23 (×4): 3 [IU] via SUBCUTANEOUS
  Administered 2024-04-24: 2 [IU] via SUBCUTANEOUS
  Administered 2024-04-24: 3 [IU] via SUBCUTANEOUS
  Administered 2024-04-25 – 2024-04-26 (×2): 5 [IU] via SUBCUTANEOUS
  Administered 2024-04-26: 2 [IU] via SUBCUTANEOUS
  Administered 2024-04-27 – 2024-04-28 (×3): 3 [IU] via SUBCUTANEOUS
  Administered 2024-04-29: 8 [IU] via SUBCUTANEOUS
  Administered 2024-04-29: 2 [IU] via SUBCUTANEOUS

## 2024-03-05 MED ORDER — OXYCODONE HCL 5 MG PO TABS
5.0000 mg | ORAL_TABLET | Freq: Two times a day (BID) | ORAL | Status: DC | PRN
  Administered 2024-03-06 – 2024-03-12 (×11): 5 mg via ORAL
  Filled 2024-03-05 (×14): qty 1

## 2024-03-05 MED ORDER — TAFAMIDIS 61 MG PO CAPS
1.0000 | ORAL_CAPSULE | Freq: Every day | ORAL | Status: DC
  Administered 2024-03-05 – 2024-08-21 (×172): 61 mg via ORAL
  Filled 2024-03-05 (×180): qty 1

## 2024-03-05 MED ORDER — INSULIN ASPART 100 UNIT/ML IJ SOLN
0.0000 [IU] | Freq: Every day | INTRAMUSCULAR | Status: DC
Start: 2024-03-05 — End: 2024-04-29
  Administered 2024-03-05: 2 [IU] via SUBCUTANEOUS
  Administered 2024-03-06: 4 [IU] via SUBCUTANEOUS
  Administered 2024-03-07: 3 [IU] via SUBCUTANEOUS
  Administered 2024-03-08 – 2024-03-22 (×3): 2 [IU] via SUBCUTANEOUS

## 2024-03-05 MED ORDER — HEPARIN (PORCINE) 25000 UT/250ML-% IV SOLN
1650.0000 [IU]/h | INTRAVENOUS | Status: DC
  Administered 2024-03-05: 1000 [IU]/h via INTRAVENOUS
  Administered 2024-03-06 – 2024-03-08 (×3): 1100 [IU]/h via INTRAVENOUS
  Administered 2024-03-09: 1300 [IU]/h via INTRAVENOUS
  Administered 2024-03-10: 1650 [IU]/h via INTRAVENOUS
  Administered 2024-03-10: 1300 [IU]/h via INTRAVENOUS
  Filled 2024-03-05 (×7): qty 250

## 2024-03-05 MED ORDER — HYDROMORPHONE HCL 1 MG/ML IJ SOLN
1.0000 mg | Freq: Once | INTRAMUSCULAR | Status: AC
  Administered 2024-03-06: 1 mg via INTRAVENOUS
  Filled 2024-03-05: qty 1

## 2024-03-05 MED ORDER — SODIUM CHLORIDE 0.9 % IV SOLN: INTRAVENOUS | Status: DC

## 2024-03-05 MED ORDER — ZINC OXIDE 40 % EX OINT
TOPICAL_OINTMENT | Freq: Two times a day (BID) | CUTANEOUS | Status: DC
  Administered 2024-03-29 – 2024-07-30 (×8): 1 via TOPICAL
  Filled 2024-03-05 (×9): qty 57

## 2024-03-05 MED ORDER — COLLAGENASE 250 UNIT/GM EX OINT
TOPICAL_OINTMENT | Freq: Every day | CUTANEOUS | Status: DC
  Filled 2024-03-05 (×2): qty 30

## 2024-03-05 MED ORDER — DIGOXIN 0.25 MG/ML IJ SOLN
0.2500 mg | Freq: Once | INTRAMUSCULAR | Status: AC
  Administered 2024-03-05: 0.25 mg via INTRAVENOUS
  Filled 2024-03-05: qty 2

## 2024-03-05 MED ORDER — INSULIN GLARGINE-YFGN 100 UNIT/ML ~~LOC~~ SOLN
10.0000 [IU] | Freq: Every day | SUBCUTANEOUS | Status: DC
  Administered 2024-03-06 – 2024-03-08 (×3): 10 [IU] via SUBCUTANEOUS
  Filled 2024-03-05 (×4): qty 0.1

## 2024-03-05 MED ORDER — DAKINS (1/4 STRENGTH) 0.125 % EX SOLN
Freq: Two times a day (BID) | CUTANEOUS | Status: AC
  Filled 2024-03-05: qty 473

## 2024-03-05 MED ORDER — DIGOXIN 125 MCG PO TABS
0.1250 mg | ORAL_TABLET | Freq: Every day | ORAL | Status: DC
  Administered 2024-03-06 – 2024-03-09 (×4): 0.125 mg via ORAL
  Filled 2024-03-05 (×4): qty 1

## 2024-03-05 MED ORDER — HYDROMORPHONE HCL 1 MG/ML IJ SOLN
0.5000 mg | INTRAMUSCULAR | Status: DC | PRN
  Administered 2024-03-05 – 2024-03-10 (×11): 0.5 mg via INTRAVENOUS
  Filled 2024-03-05 (×13): qty 1

## 2024-03-05 MED ORDER — LACTATED RINGERS IV BOLUS
1000.0000 mL | INTRAVENOUS | Status: AC
  Administered 2024-03-05: 1000 mL via INTRAVENOUS

## 2024-03-05 MED ORDER — LACTATED RINGERS IV SOLN: INTRAVENOUS | Status: AC

## 2024-03-05 MED ORDER — SODIUM CHLORIDE 0.9 % IV SOLN
2.0000 g | Freq: Three times a day (TID) | INTRAVENOUS | Status: DC
  Administered 2024-03-05 – 2024-03-12 (×22): 2 g via INTRAVENOUS
  Filled 2024-03-05 (×22): qty 12.5

## 2024-03-05 MED ORDER — DAKINS (1/4 STRENGTH) 0.125 % EX SOLN
Freq: Every day | CUTANEOUS | Status: DC
  Filled 2024-03-05: qty 473

## 2024-03-05 MED ORDER — VANCOMYCIN HCL 1500 MG/300ML IV SOLN
1500.0000 mg | INTRAVENOUS | Status: DC
  Administered 2024-03-05 – 2024-03-11 (×7): 1500 mg via INTRAVENOUS
  Filled 2024-03-05 (×8): qty 300

## 2024-03-05 MED ORDER — MIDODRINE HCL 5 MG PO TABS
5.0000 mg | ORAL_TABLET | Freq: Three times a day (TID) | ORAL | Status: DC
  Administered 2024-03-05 – 2024-04-18 (×121): 5 mg via ORAL
  Filled 2024-03-05 (×127): qty 1

## 2024-03-05 NOTE — Progress Notes (Addendum)
 PHARMACY - ANTICOAGULATION CONSULT NOTE  Pharmacy Consult for heparin  Indication: atrial fibrillation  Allergies  Allergen Reactions   Penicillins Other (See Comments)    Did it involve swelling of the face/tongue/throat, SOB, or low BP? Unknown Did it involve sudden or severe rash/hives, skin peeling, or any reaction on the inside of your mouth or nose? Unknown Did you need to seek medical attention at a hospital or doctor's office? No When did it last happen?    Over 5 Years Ago   If all above answers are "NO", may proceed with cephalosporin use.      Patient Measurements: Height: 5\' 11"  (180.3 cm) Weight: 68.5 kg (151 lb) IBW/kg (Calculated) : 75.3 HEPARIN DW (KG): 68.5  Vital Signs: Temp: 98.1 F (36.7 C) (04/15 1019) Temp Source: Oral (04/15 1019) BP: 97/55 (04/15 0900) Pulse Rate: 142 (04/15 0900)  Labs: Recent Labs    03/04/24 1706 03/05/24 0446  HGB 10.8* 9.0*  HCT 30.8* 25.3*  PLT 378 270  APTT 54*  --   LABPROT 19.7*  --   INR 1.6*  --   CREATININE 1.02 0.82    Estimated Creatinine Clearance: 67.3 mL/min (by C-G formula based on SCr of 0.82 mg/dL).   Medical History: Past Medical History:  Diagnosis Date   Adenomatous colon polyp    Allergy    Anal fissure    Arthritis    BPH (benign prostatic hyperplasia)    Diabetes mellitus (HCC)    GERD (gastroesophageal reflux disease)    Hyperlipidemia    Hypertension    Hypothyroidism    Other testicular hypofunction    Stroke [facility])    Assessment: Patient admitted with CC of afib with RVR + potential sepsis and hypotension due to UTI vs. Sacral wound. Patient on Eliquis PTA, held for potential surgical intervention on sacral ulcer. No current plans for intervention per general surgery. Last dose of Eliquis on 4/14 @ 08:00am.   HgB 9.0 and PLTs 270. Pharmacy consulted to dose heparin.   Goal of Therapy:  Heparin level 0.3-0.7 units/ml aPTT 66-102 Monitor platelets by anticoagulation protocol:  Yes   Plan:  No bolus w/ recent DOAC intake.  Start heparin infusion at 1000 units/hr Check anti-Xa level in 8 hours and daily while on heparin, will check aPTT with tonights check given recent Eliquis.  Continue to monitor H&H and platelets F/u daily for potential OR plans.   Mamie Searles, PharmD, BCCCP  03/05/2024,11:13 AM

## 2024-03-05 NOTE — Progress Notes (Signed)
 PROGRESS NOTE  Earl [name]  HQI:[id number] DOB: 1941-06-29 DOA: 03/04/2024 PCP: [name], MD   Brief Narrative: Patient is a 83 year old male with history of paroxysmal A-fib, insulin-dependent diabetes, hypertension, hyperlipidemia, CVA with residual right-sided weakness, MGUS, GERD, BPH, hypothyroidism, HFrEF with EF of 40 to 45%, chronic pancytopenia who presented to the emergency department from SNF with complaint of shortness of breath, wound, pain in the sacral area.  He was just discharged from here to [facility] rehab 4 days ago.  Hospital course was remarkable for acute metabolic encephalopathy, hypotension, hypoglycemia.  On presentation, he was hypotensive, EKG showed A-fib with RVR, tachypnea but saturating fine on room air.  Respiratory panel negative for COVID.  PCCM consulted for persistent hypotension.  Cardiology also consulted.  General surgery consulted for sacral ulcer.  Started on broad-spectrum antibiotics, amiodarone drip.  Assessment & Plan:  Principal Problem:   Severe sepsis (HCC) Active Problems:   Paroxysmal atrial fibrillation (HCC)   Sepsis (HCC)   Atrial flutter (HCC)   Sacral ulcer (HCC)   MGUS (monoclonal gammopathy of unknown significance)   Essential hypertension   Hyperlipidemia   Insulin dependent type 2 diabetes mellitus (HCC)   History of CVA (cerebrovascular accident)   Cardiac amyloidosis (HCC)   Chronic systolic CHF (congestive heart failure) (HCC)   GERD (gastroesophageal reflux disease)   History of hypertension   Atrial fibrillation with RVR (HCC)  Sepsis secondary to sacral pressure wound/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  Hypotensive, encephalopathic, hypoglycemic, elevated lactate, leukocytosis on presentation.  Started on IV fluid, blood spectrum antibiotics.  Follow-up cultures. Code sepsis was activated.  PCCM  recommended to continue midodrine. Blood pressure better with systolic in the range of 90s.  Currently he is alert and oriented.  Chronic sacral wound with suspicion of infection: CT chest/abdomen/pelvis showed sacral decubitus wound with extension to the right gluteus muscle, no definitive bony lesions.  General surgery consulted, not currently planned for debridement but may need.  Wound care consulted for enzymatic debridement of the sacral ulcer 1..  Continue current antibiotics.  PT consulted for hydrotherapy.  History of paroxysmal A-fib: Presented with A-fib with RVR.  Toprol was discontinued on last admission.  Started on amiodarone drip.  Cardiology following.  Eliquis on hold for possible need of debridement  History of HFrEF/transthyretin cardiac amyloidosis/nonischemic cardiomyopathy/hypertension: Presented with hypotension.  Hanley Ben, Toprol at home,. Currently on hold  Insulin-dependent diabetes type 2: Presented with hyperglycemia.  Recent A1c in the range of 8.  Diabetic coordinator consulted.  Currently on sliding scale.  Hyperglycemic.  Started on Lantus 10 units  History of CVA: Has a right-sided residual weakness.  On Eliquis, Lipitor  GERD: Continue Protonix  Anemia of chronic disease: Continue iron supplementation  Hypothyroidism: Continue levothyroxine  Recent history of COVID: COVID was positive on 02/14/2024.  Still positive.  Out of 21 days window, do not think he needs contact/admin precaution        DVT prophylaxis:SCDs Start: 03/04/24 2151 Place TED hose Start: 03/04/24 2151     Code Status: Limited: Do not attempt resuscitation (DNR) -DNR-LIMITED -Do Not Intubate/DNI   Family Communication: Daughter at bedside  Patient status:Inpatient  Patient is from :home  Anticipated discharge to:not sure  Estimated DC date:not sure   Consultants:Cardiology,pccm   Procedures:None yet  Antimicrobials:  Anti-infectives (From admission, onward)     Start     Dose/Rate Route Frequency  Ordered Stop   03/05/24 1930  vancomycin (VANCOREADY) IVPB 1250 mg/250 mL  Status:  Discontinued        1,250 mg 166.7 mL/hr over 90 Minutes Intravenous Every 24 hours 03/04/24 2205 03/04/24 2224   03/05/24 1930  Vancomycin (VANCOCIN) 1,250 mg in sodium chloride 0.9 % 250 mL IVPB  Status:  Discontinued        1,250 mg 166.7 mL/hr over 90 Minutes Intravenous Every 24 hours 03/04/24 2224 03/05/24 0717   03/05/24 1930  vancomycin (VANCOREADY) IVPB 1500 mg/300 mL        1,500 mg 150 mL/hr over 120 Minutes Intravenous Every 24 hours 03/05/24 0717     03/05/24 1400  ceFEPIme (MAXIPIME) 2 g in sodium chloride 0.9 % 100 mL IVPB        2 g 200 mL/hr over 30 Minutes Intravenous Every 8 hours 03/05/24 0717     03/05/24 0600  ceFEPIme (MAXIPIME) 2 g in sodium chloride 0.9 % 100 mL IVPB  Status:  Discontinued        2 g 200 mL/hr over 30 Minutes Intravenous Every 12 hours 03/04/24 2205 03/05/24 0717   03/05/24 0000  metroNIDAZOLE (FLAGYL) IVPB 500 mg  Status:  Discontinued        500 mg 100 mL/hr over 60 Minutes Intravenous Every 12 hours 03/04/24 2206 03/04/24 2209   03/04/24 1730  aztreonam (AZACTAM) 2 g in sodium chloride 0.9 % 100 mL IVPB  Status:  Discontinued        2 g 200 mL/hr over 30 Minutes Intravenous  Once 03/04/24 1715 03/04/24 1724   03/04/24 1730  metroNIDAZOLE (FLAGYL) IVPB 500 mg        500 mg 100 mL/hr over 60 Minutes Intravenous Every 12 hours 03/04/24 1715 03/11/24 1714   03/04/24 1730  vancomycin (VANCOREADY) IVPB 1500 mg/300 mL        1,500 mg 150 mL/hr over 120 Minutes Intravenous  Once 03/04/24 1715 03/04/24 2153   03/04/24 1730  ceFEPIme (MAXIPIME) 2 g in sodium chloride 0.9 % 100 mL IVPB        2 g 200 mL/hr over 30 Minutes Intravenous  Once 03/04/24 1724 03/04/24 1826       Subjective: Patient seen and examined at bedside today.  EKG monitor showed heart rate in the range of 130s.  Blood pressure better with systolic in the range  of 90s.  He is alert and oriented.  Complains of sacral discomfort from the wound.  Long discussion held at the bedside with daughter about his management plan.  Objective: Vitals:   03/05/24 0630 03/05/24 0645 03/05/24 0700 03/05/24 0715  BP: (!) 89/58 91/60 (!) 95/54 (!) 92/58  Pulse: (!) 142 (!) 142 (!) 140 (!) 139  Resp: 17 (!) 27 (!) 25 (!) 24  Temp:      TempSrc:      SpO2: 97% 97% 96% 96%  Weight:      Height:       No intake or output data in the 24 hours ending 03/05/24 0824 Filed Weights   03/04/24 1656  Weight: 68.5 kg    Examination:  General exam: Overall comfortable, not in distress, deconditioned, lying on bed HEENT: PERRL Respiratory system:  no wheezes or crackles  Cardiovascular system: Irregularly irregular rhythm Gastrointestinal system: Abdomen is nondistended, soft and nontender. Central nervous system: Alert and oriented Extremities: No edema, no clubbing ,no cyanosis Skin: Sacral pressure ulcer   Data Reviewed: I have personally reviewed  following labs and imaging studies  CBC: Recent Labs  Lab 02/27/24 0949 03/04/24 1706 03/05/24 0446  WBC 8.9 13.3* 10.7*  NEUTROABS 6.5 10.8*  --   HGB 12.4* 10.8* 9.0*  HCT 36.6* 30.8* 25.3*  MCV 86.3 84.2 84.1  PLT 339 378 270   Basic Metabolic Panel: Recent Labs  Lab 02/27/24 0949 03/04/24 1706 03/05/24 0446  NA 139 130* 135  K 4.1 5.0 4.2  CL 105 100 106  CO2 23 21* 23  GLUCOSE 125* 500* 416*  BUN 17 28* 18  CREATININE 0.96 1.02 0.82  CALCIUM 8.1* 7.8* 7.9*     Recent Results (from the past 240 hours)  Resp panel by RT-PCR (RSV, Flu A&B, Covid) Anterior Nasal Swab     Status: Abnormal   Collection Time: 03/04/24  5:38 PM   Specimen: Anterior Nasal Swab  Result Value Ref Range Status   SARS Coronavirus 2 by RT PCR POSITIVE (A) NEGATIVE Final   Influenza A by PCR NEGATIVE NEGATIVE Final   Influenza B by PCR NEGATIVE NEGATIVE Final    Comment: (NOTE) The Xpert Xpress SARS-CoV-2/FLU/RSV  plus assay is intended as an aid in the diagnosis of influenza from Nasopharyngeal swab specimens and should not be used as a sole basis for treatment. Nasal washings and aspirates are unacceptable for Xpert Xpress SARS-CoV-2/FLU/RSV testing.  Fact Sheet for Patients: BloggerCourse.com  Fact Sheet for Healthcare Providers: SeriousBroker.it  This test is not yet approved or cleared by the [place]  FDA and has been authorized for detection and/or diagnosis of SARS-CoV-2 by FDA under an Emergency Use Authorization (EUA). This EUA will remain in effect (meaning this test can be used) for the duration of the COVID-19 declaration under Section 564(b)(1) of the Act, 21 U.S.C. section 360bbb-3(b)(1), unless the authorization is terminated or revoked.     Resp Syncytial Virus by PCR NEGATIVE NEGATIVE Final    Comment: (NOTE) Fact Sheet for Patients: BloggerCourse.com  Fact Sheet for Healthcare Providers: SeriousBroker.it  This test is not yet approved or cleared by the [place]  FDA and has been authorized for detection and/or diagnosis of SARS-CoV-2 by FDA under an Emergency Use Authorization (EUA). This EUA will remain in effect (meaning this test can be used) for the duration of the COVID-19 declaration under Section 564(b)(1) of the Act, 21 U.S.C. section 360bbb-3(b)(1), unless the authorization is terminated or revoked.  Performed at [facility], [address] [place], [place] [address]      Radiology Studies: CT CHEST ABDOMEN PELVIS W CONTRAST Result Date: 03/04/2024 CLINICAL DATA:  Possible sepsis and sacral wound EXAM: CT CHEST, ABDOMEN, AND PELVIS WITH CONTRAST TECHNIQUE: Multidetector CT imaging of the chest, abdomen and pelvis was performed following the standard protocol during bolus administration of intravenous contrast. RADIATION DOSE REDUCTION: This  exam was performed according to the departmental dose-optimization program which includes automated exposure control, adjustment of the mA and/or kV according to patient size and/or use of iterative reconstruction technique. CONTRAST:  75mL OMNIPAQUE IOHEXOL 350 MG/ML SOLN COMPARISON:  None Available. FINDINGS: CT CHEST FINDINGS Cardiovascular: Atherosclerotic calcifications of the thoracic aorta are noted without aneurysmal dilatation or dissection. Heart is mildly enlarged in size. The pulmonary artery shows a normal branching pattern bilaterally. No central pulmonary embolus is noted although not timed for embolus evaluation. Mediastinum/Nodes: The esophagus is within normal limits. No hilar or mediastinal adenopathy is noted. A large peripherally enhancing and partially calcified nodule is noted in the left lobe thyroid measuring up  to 5 cm. This has been previously biopsied and no further follow-up is recommended. Lipoma of the strap muscles on the left is again seen and stable. Lungs/Pleura: The lungs are well aerated bilaterally. Small left pleural effusion and left basilar atelectasis is noted. Minimal right basilar atelectasis is seen as well. No focal confluent infiltrate is noted. No focal nodule is seen. Musculoskeletal: Degenerative changes of the thoracic spine are noted. No acute bony abnormality is seen. CT ABDOMEN PELVIS FINDINGS Hepatobiliary: No focal liver abnormality is seen. No gallstones, gallbladder wall thickening, or biliary dilatation. Pancreas: Unremarkable. No pancreatic ductal dilatation or surrounding inflammatory changes. Spleen: Normal in size without focal abnormality. Adrenals/Urinary Tract: Adrenal glands are within normal limits. Kidneys demonstrate a normal enhancement pattern bilaterally. No renal calculi are seen. A few small cysts are noted. No follow-up is recommended. The bladder is well distended. Stomach/Bowel: Scattered fecal material is noted throughout the colon  without obstructive change. The appendix is not well visualized. No inflammatory changes to suggest appendicitis are noted. Small bowel and stomach are within normal limits. Vascular/Lymphatic: Aortic atherosclerosis. No enlarged abdominal or pelvic lymph nodes. Reproductive: Prostate is unremarkable. Other: No abdominal wall hernia or abnormality. No abdominopelvic ascites. Musculoskeletal: Sacral decubitus wound is noted although no bony changes are seen at this time. The wound extends to the margin of the sacrum as well as within the gluteal muscles on the right. No discrete drainable abscess is noted. IMPRESSION: CT of the chest: Small left pleural effusion and left basilar atelectasis. Mild right basilar atelectasis is noted as well. CT of the abdomen and pelvis: Sacral decubitus wound with extension into the right gluteal muscles. No definitive bony erosive changes are seen. Scattered fecal material within the colon without obstructive change. Electronically Signed   By: Violeta Grey M.D.   On: 03/04/2024 21:39   DG Chest Port 1 View Result Date: 03/04/2024 CLINICAL DATA:  Possible sepsis EXAM: PORTABLE CHEST 1 VIEW COMPARISON:  02/21/2024 FINDINGS: Cardiac shadow is stable. Tortuous thoracic aorta is again noted. The lungs are well aerated without focal infiltrate or effusion. No bony abnormality is seen. IMPRESSION: No active disease. Electronically Signed   By: Violeta Grey M.D.   On: 03/04/2024 19:59    Scheduled Meds:  atorvastatin  40 mg Oral Daily   [START ON 03/10/2024] collagenase   Topical Daily   ferrous sulfate  325 mg Oral QHS   levothyroxine  50 mcg Oral Q0600   liver oil-zinc oxide   Topical BID   sodium chloride flush  3 mL Intravenous Q12H   sodium hypochlorite   Irrigation Daily   Continuous Infusions:  sodium chloride     amiodarone 30 mg/hr (03/05/24 0732)   ceFEPime (MAXIPIME) IV     lactated ringers 150 mL/hr (03/05/24 0246)   metronidazole Stopped (03/05/24 0981)    vancomycin       LOS: 1 day   Leona Rake, MD [facility] P4/15/2025, 8:24 AM

## 2024-03-05 NOTE — Progress Notes (Signed)
 Progress Note     Subjective: Seen during PT hydrotherapy, daughter at bedside. Consent given to debride sharply at bedside for nonviable tissue in the interest of trying to avoid OR. Purulent drainage noted from wound.   Objective: Vital signs in last 24 hours: Temp:  [97.8 F (36.6 C)-98.1 F (36.7 C)] 98 F (36.7 C) (04/15 1304) Pulse Rate:  [83-154] 142 (04/15 1304) Resp:  [6-35] 28 (04/15 1130) BP: (78-109)/(48-83) 108/83 (04/15 1130) SpO2:  [93 %-100 %] 100 % (04/15 1304) Weight:  [68.5 kg] 68.5 kg (04/14 1656)    Intake/Output from previous day: No intake/output data recorded. Intake/Output this shift: Total I/O In: -  Out: 1700 [Urine:1700]  PE: General: WD, chronically ill appearing male who is laying in bed in NAD MSK: sacral wound with necrotic eschar and purulent drainage pictured below  S/P excisional debridement there is some healthy granulation tissue with some non-viable tissue around wound edges and superiorly, wound extends ~6 cm cephalad and to the right about 2 o'clock, thin layer tissue overlying bone but I do not think bone is necessarily exposed at this time   Lab Results:  Recent Labs    03/04/24 1706 03/05/24 0446  WBC 13.3* 10.7*  HGB 10.8* 9.0*  HCT 30.8* 25.3*  PLT 378 270   BMET Recent Labs    03/04/24 1706 03/05/24 0446  NA 130* 135  K 5.0 4.2  CL 100 106  CO2 21* 23  GLUCOSE 500* 416*  BUN 28* 18  CREATININE 1.02 0.82  CALCIUM 7.8* 7.9*   PT/INR Recent Labs    03/04/24 1706  LABPROT 19.7*  INR 1.6*   CMP     Component Value Date/Time   NA 135 03/05/2024 0446   NA 141 08/23/2023 1541   NA 140 10/25/2017 0753   K 4.2 03/05/2024 0446   K 4.2 10/25/2017 0753   CL 106 03/05/2024 0446   CL 101 10/23/2012 0802   CO2 23 03/05/2024 0446   CO2 21 (L) 10/25/2017 0753   GLUCOSE 416 (H) 03/05/2024 0446   GLUCOSE 160 (H) 10/25/2017 0753   GLUCOSE 149 (H) 10/23/2012 0802   BUN 18 03/05/2024 0446   BUN 23 08/23/2023  1541   BUN 13.1 10/25/2017 0753   CREATININE 0.82 03/05/2024 0446   CREATININE 1.00 10/31/2023 1601   CREATININE 1.1 10/25/2017 0753   CALCIUM 7.9 (L) 03/05/2024 0446   CALCIUM 9.1 10/25/2017 0753   PROT 4.7 (L) 03/05/2024 0446   PROT 6.0 (L) 10/25/2017 0753   PROT 5.8 (L) 10/25/2017 0752   ALBUMIN 1.9 (L) 03/05/2024 0446   ALBUMIN 3.5 10/25/2017 0753   AST 41 03/05/2024 0446   AST 20 12/22/2021 1013   AST 17 10/25/2017 0753   ALT 31 03/05/2024 0446   ALT 19 12/22/2021 1013   ALT 19 10/25/2017 0753   ALKPHOS 72 03/05/2024 0446   ALKPHOS 85 10/25/2017 0753   BILITOT 0.7 03/05/2024 0446   BILITOT 0.6 12/22/2021 1013   BILITOT 0.48 10/25/2017 0753   GFRNONAA >60 03/05/2024 0446   GFRNONAA >60 12/22/2021 1013   GFRNONAA 67 05/17/2021 1208   GFRAA 77 05/17/2021 1208   Lipase     Component Value Date/Time   LIPASE 40 01/16/2020 1119       Studies/Results: CT CHEST ABDOMEN PELVIS W CONTRAST Result Date: 03/04/2024 CLINICAL DATA:  Possible sepsis and sacral wound EXAM: CT CHEST, ABDOMEN, AND PELVIS WITH CONTRAST TECHNIQUE: Multidetector CT imaging of the chest, abdomen and  pelvis was performed following the standard protocol during bolus administration of intravenous contrast. RADIATION DOSE REDUCTION: This exam was performed according to the departmental dose-optimization program which includes automated exposure control, adjustment of the mA and/or kV according to patient size and/or use of iterative reconstruction technique. CONTRAST:  75mL OMNIPAQUE IOHEXOL 350 MG/ML SOLN COMPARISON:  None Available. FINDINGS: CT CHEST FINDINGS Cardiovascular: Atherosclerotic calcifications of the thoracic aorta are noted without aneurysmal dilatation or dissection. Heart is mildly enlarged in size. The pulmonary artery shows a normal branching pattern bilaterally. No central pulmonary embolus is noted although not timed for embolus evaluation. Mediastinum/Nodes: The esophagus is within normal  limits. No hilar or mediastinal adenopathy is noted. A large peripherally enhancing and partially calcified nodule is noted in the left lobe thyroid measuring up to 5 cm. This has been previously biopsied and no further follow-up is recommended. Lipoma of the strap muscles on the left is again seen and stable. Lungs/Pleura: The lungs are well aerated bilaterally. Small left pleural effusion and left basilar atelectasis is noted. Minimal right basilar atelectasis is seen as well. No focal confluent infiltrate is noted. No focal nodule is seen. Musculoskeletal: Degenerative changes of the thoracic spine are noted. No acute bony abnormality is seen. CT ABDOMEN PELVIS FINDINGS Hepatobiliary: No focal liver abnormality is seen. No gallstones, gallbladder wall thickening, or biliary dilatation. Pancreas: Unremarkable. No pancreatic ductal dilatation or surrounding inflammatory changes. Spleen: Normal in size without focal abnormality. Adrenals/Urinary Tract: Adrenal glands are within normal limits. Kidneys demonstrate a normal enhancement pattern bilaterally. No renal calculi are seen. A few small cysts are noted. No follow-up is recommended. The bladder is well distended. Stomach/Bowel: Scattered fecal material is noted throughout the colon without obstructive change. The appendix is not well visualized. No inflammatory changes to suggest appendicitis are noted. Small bowel and stomach are within normal limits. Vascular/Lymphatic: Aortic atherosclerosis. No enlarged abdominal or pelvic lymph nodes. Reproductive: Prostate is unremarkable. Other: No abdominal wall hernia or abnormality. No abdominopelvic ascites. Musculoskeletal: Sacral decubitus wound is noted although no bony changes are seen at this time. The wound extends to the margin of the sacrum as well as within the gluteal muscles on the right. No discrete drainable abscess is noted. IMPRESSION: CT of the chest: Small left pleural effusion and left basilar  atelectasis. Mild right basilar atelectasis is noted as well. CT of the abdomen and pelvis: Sacral decubitus wound with extension into the right gluteal muscles. No definitive bony erosive changes are seen. Scattered fecal material within the colon without obstructive change. Electronically Signed   By: Alcide Clever M.D.   On: 03/04/2024 21:39   DG Chest Port 1 View Result Date: 03/04/2024 CLINICAL DATA:  Possible sepsis EXAM: PORTABLE CHEST 1 VIEW COMPARISON:  02/21/2024 FINDINGS: Cardiac shadow is stable. Tortuous thoracic aorta is again noted. The lungs are well aerated without focal infiltrate or effusion. No bony abnormality is seen. IMPRESSION: No active disease. Electronically Signed   By: Alcide Clever M.D.   On: 03/04/2024 19:59    Anti-infectives: Anti-infectives (From admission, onward)    Start     Dose/Rate Route Frequency Ordered Stop   03/05/24 1930  vancomycin (VANCOREADY) IVPB 1250 mg/250 mL  Status:  Discontinued        1,250 mg 166.7 mL/hr over 90 Minutes Intravenous Every 24 hours 03/04/24 2205 03/04/24 2224   03/05/24 1930  Vancomycin (VANCOCIN) 1,250 mg in sodium chloride 0.9 % 250 mL IVPB  Status:  Discontinued  1,250 mg 166.7 mL/hr over 90 Minutes Intravenous Every 24 hours 03/04/24 2224 03/05/24 0717   03/05/24 1930  vancomycin (VANCOREADY) IVPB 1500 mg/300 mL        1,500 mg 150 mL/hr over 120 Minutes Intravenous Every 24 hours 03/05/24 0717     03/05/24 1400  ceFEPIme (MAXIPIME) 2 g in sodium chloride 0.9 % 100 mL IVPB        2 g 200 mL/hr over 30 Minutes Intravenous Every 8 hours 03/05/24 0717     03/05/24 0600  ceFEPIme (MAXIPIME) 2 g in sodium chloride 0.9 % 100 mL IVPB  Status:  Discontinued        2 g 200 mL/hr over 30 Minutes Intravenous Every 12 hours 03/04/24 2205 03/05/24 0717   03/05/24 0000  metroNIDAZOLE (FLAGYL) IVPB 500 mg  Status:  Discontinued        500 mg 100 mL/hr over 60 Minutes Intravenous Every 12 hours 03/04/24 2206 03/04/24 2209    03/04/24 1730  aztreonam (AZACTAM) 2 g in sodium chloride 0.9 % 100 mL IVPB  Status:  Discontinued        2 g 200 mL/hr over 30 Minutes Intravenous  Once 03/04/24 1715 03/04/24 1724   03/04/24 1730  metroNIDAZOLE (FLAGYL) IVPB 500 mg        500 mg 100 mL/hr over 60 Minutes Intravenous Every 12 hours 03/04/24 1715 03/11/24 1714   03/04/24 1730  vancomycin (VANCOREADY) IVPB 1500 mg/300 mL        1,500 mg 150 mL/hr over 120 Minutes Intravenous  Once 03/04/24 1715 03/04/24 2153   03/04/24 1730  ceFEPIme (MAXIPIME) 2 g in sodium chloride 0.9 % 100 mL IVPB        2 g 200 mL/hr over 30 Minutes Intravenous  Once 03/04/24 1724 03/04/24 1826        Assessment/Plan  Sacral decubitus ulcer, stage 4 - was able to excise necrotic eschar  (see separate procedure note) overlying wound today and found wound extends 6 cm cephalad and to the right slightly, purulent drainage present - recommend BID WTD dressing changes with Dakin's  - offload pressure as able  - leukocytosis improving, fine to continue abx for now but may not need further if wound cleaning up  - continue to hold eliquis for now and will re-evaluate Thursday - if improving then will continue wound care and conservative management. If worsening then will consider operative debridement   FEN: NPO, ok to have diet from surgical standpoint, IVF per TRH  VTE: heparin gtt ID: [email or web address]  - per TRH -  Recent COVID Deconditioned  Paroxysmal A. Fib on eliquis Bradycardia T2DM HTN HLD Hx of CVA with residual R sided weakness GERD BPH Thyroid disease Amyloid cardiomyopathy HFrEF Chronic pancytopenia   LOS: 1 day   I reviewed Consultant CCM, cardiology notes, hospitalist notes, last 24 h vitals and pain scores, last 48 h intake and output, last 24 h labs and trends, and last 24 h imaging results.  This care required high  level of medical decision making.    Annetta Killian, [facility] Surgery 03/05/2024,  2:39 PM Please see Amion for pager number during day hours 7:00am-4:30pm

## 2024-03-05 NOTE — Progress Notes (Addendum)
 Rounding Note    Patient Name: Earl [name] Date of Encounter: 03/05/2024  [facility] Cardiologist: Sherryl Manges, MD   Subjective   Patient was admitted overnight for sepsis secondary to sacral wound infection. He was noted to be in rapid atrial fibrillation/ flutter on arrival. He is currently resting in no acute distress. He denies any cardiac complaints this morning. No chest pain, shortness of breath, palpitations/ heart racing, or lightheadedness/ dizziness. He does have some confusion. When asked what brought him to the hospital, he said multiple family members (many who had passed away per daughter) told him to come in due to his sacral wound.  Inpatient Medications    Scheduled Meds:  atorvastatin  40 mg Oral Daily   [START ON 03/10/2024] collagenase   Topical Daily   ferrous sulfate  325 mg Oral QHS   levothyroxine  50 mcg Oral Q0600   liver oil-zinc oxide   Topical BID   sodium chloride flush  3 mL Intravenous Q12H   sodium hypochlorite   Irrigation Daily   Continuous Infusions:  sodium chloride     amiodarone 30 mg/hr (03/05/24 0732)   ceFEPime (MAXIPIME) IV     lactated ringers 150 mL/hr (03/05/24 0246)   metronidazole Stopped (03/05/24 1610)   vancomycin     PRN Meds: sodium chloride, acetaminophen **OR** acetaminophen, insulin aspart, ondansetron **OR** ondansetron (ZOFRAN) IV, sodium chloride flush   Vital Signs    Vitals:   03/05/24 0630 03/05/24 0645 03/05/24 0700 03/05/24 0715  BP: (!) 89/58 91/60 (!) 95/54 (!) 92/58  Pulse: (!) 142 (!) 142 (!) 140 (!) 139  Resp: 17 (!) 27 (!) 25 (!) 24  Temp:      TempSrc:      SpO2: 97% 97% 96% 96%  Weight:      Height:       No intake or output data in the 24 hours ending 03/05/24 0756    03/04/2024    4:56 PM 02/14/2024    7:52 PM 01/24/2024   11:05 AM  Last 3 Weights  Weight (lbs) 151 lb 159 lb 159 lb  Weight (kg) 68.493 kg 72.122 kg 72.122 kg      Telemetry    Atrial flutter with  rates in the 140s. - Personally Reviewed  ECG    No new ECG tracing today. - Personally Reviewed  Physical Exam   GEN: African-American male resting comfortably in no acute distress.   Neck: No JVD. Cardiac: Tachycardic with regularly rhythm. No murmurs, rubs, or gallops.  Respiratory: No increased work of breathing. Clear to auscultation bilaterally. No wheezes, rhonchi, or rales. MS: Trace lower extremity edema bilaterally (mostly in feet). No deformity. Skin: Warm and dry. Neuro:  No focal deficits. Mild confusion.  Labs    High Sensitivity Troponin:  No results for input(s): "TROPONINIHS" in the last 720 hours.   Chemistry Recent Labs  Lab 02/27/24 0949 03/04/24 1706 03/05/24 0446  NA 139 130* 135  K 4.1 5.0 4.2  CL 105 100 106  CO2 23 21* 23  GLUCOSE 125* 500* 416*  BUN 17 28* 18  CREATININE 0.96 1.02 0.82  CALCIUM 8.1* 7.8* 7.9*  PROT  --  4.9* 4.7*  ALBUMIN  --  <1.5* 1.9*  AST  --  60* 41  ALT  --  40 31  ALKPHOS  --  88 72  BILITOT  --  0.5 0.7  GFRNONAA >60 >60 >60  ANIONGAP 11 9 6  Lipids No results for input(s): "CHOL", "TRIG", "HDL", "LABVLDL", "LDLCALC", "CHOLHDL" in the last 168 hours.  Hematology Recent Labs  Lab 02/27/24 0949 03/04/24 1706 03/05/24 0446  WBC 8.9 13.3* 10.7*  RBC 4.24 3.66* 3.01*  HGB 12.4* 10.8* 9.0*  HCT 36.6* 30.8* 25.3*  MCV 86.3 84.2 84.1  MCH 29.2 29.5 29.9  MCHC 33.9 35.1 35.6  RDW 13.4 13.4 13.4  PLT 339 378 270   Thyroid No results for input(s): "TSH", "FREET4" in the last 168 hours.  BNPNo results for input(s): "BNP", "PROBNP" in the last 168 hours.  DDimer No results for input(s): "DDIMER" in the last 168 hours.   Radiology    CT CHEST ABDOMEN PELVIS W CONTRAST Result Date: 03/04/2024 CLINICAL DATA:  Possible sepsis and sacral wound EXAM: CT CHEST, ABDOMEN, AND PELVIS WITH CONTRAST TECHNIQUE: Multidetector CT imaging of the chest, abdomen and pelvis was performed following the standard protocol during  bolus administration of intravenous contrast. RADIATION DOSE REDUCTION: This exam was performed according to the departmental dose-optimization program which includes automated exposure control, adjustment of the mA and/or kV according to patient size and/or use of iterative reconstruction technique. CONTRAST:  75mL OMNIPAQUE IOHEXOL 350 MG/ML SOLN COMPARISON:  None Available. FINDINGS: CT CHEST FINDINGS Cardiovascular: Atherosclerotic calcifications of the thoracic aorta are noted without aneurysmal dilatation or dissection. Heart is mildly enlarged in size. The pulmonary artery shows a normal branching pattern bilaterally. No central pulmonary embolus is noted although not timed for embolus evaluation. Mediastinum/Nodes: The esophagus is within normal limits. No hilar or mediastinal adenopathy is noted. A large peripherally enhancing and partially calcified nodule is noted in the left lobe thyroid measuring up to 5 cm. This has been previously biopsied and no further follow-up is recommended. Lipoma of the strap muscles on the left is again seen and stable. Lungs/Pleura: The lungs are well aerated bilaterally. Small left pleural effusion and left basilar atelectasis is noted. Minimal right basilar atelectasis is seen as well. No focal confluent infiltrate is noted. No focal nodule is seen. Musculoskeletal: Degenerative changes of the thoracic spine are noted. No acute bony abnormality is seen. CT ABDOMEN PELVIS FINDINGS Hepatobiliary: No focal liver abnormality is seen. No gallstones, gallbladder wall thickening, or biliary dilatation. Pancreas: Unremarkable. No pancreatic ductal dilatation or surrounding inflammatory changes. Spleen: Normal in size without focal abnormality. Adrenals/Urinary Tract: Adrenal glands are within normal limits. Kidneys demonstrate a normal enhancement pattern bilaterally. No renal calculi are seen. A few small cysts are noted. No follow-up is recommended. The bladder is well distended.  Stomach/Bowel: Scattered fecal material is noted throughout the colon without obstructive change. The appendix is not well visualized. No inflammatory changes to suggest appendicitis are noted. Small bowel and stomach are within normal limits. Vascular/Lymphatic: Aortic atherosclerosis. No enlarged abdominal or pelvic lymph nodes. Reproductive: Prostate is unremarkable. Other: No abdominal wall hernia or abnormality. No abdominopelvic ascites. Musculoskeletal: Sacral decubitus wound is noted although no bony changes are seen at this time. The wound extends to the margin of the sacrum as well as within the gluteal muscles on the right. No discrete drainable abscess is noted. IMPRESSION: CT of the chest: Small left pleural effusion and left basilar atelectasis. Mild right basilar atelectasis is noted as well. CT of the abdomen and pelvis: Sacral decubitus wound with extension into the right gluteal muscles. No definitive bony erosive changes are seen. Scattered fecal material within the colon without obstructive change. Electronically Signed   By: Violeta Grey M.D.   On:  03/04/2024 21:39   DG Chest Port 1 View Result Date: 03/04/2024 CLINICAL DATA:  Possible sepsis EXAM: PORTABLE CHEST 1 VIEW COMPARISON:  02/21/2024 FINDINGS: Cardiac shadow is stable. Tortuous thoracic aorta is again noted. The lungs are well aerated without focal infiltrate or effusion. No bony abnormality is seen. IMPRESSION: No active disease. Electronically Signed   By: Violeta Grey M.D.   On: 03/04/2024 19:59    Cardiac Studies   Right/ Left Cardiac Catheterization 10/07/2021:   Mid LAD lesion is 25% stenosed.   1. Normal filling pressures.  2. Preserved cardiac output.  3. No significant coronary disease.     Nonischemic cardiomyopathy.   Diagnostic Dominance: Right  _______________  Cardiac MRI 12/20/2021: Impression: 1. Normal LV size and wall thickness, EF 45% with diffuse hypokinesis. 2.  Mildly dilated RV with  normal systolic function, EF 48%. 3. Difficult delayed enhancement imaging, but there did appear to be patchy mid-wall LGE in the basal to mid septum. 4. Elevated extracellular volume percentage, measuring to 52%. This is within the range seen with cardiac amyloidosis. The LGE pattern potentially could be seen with amyloidosis, but LV walls are not thickened. Would recommend further workup for cardiac amyloidosis. 5. 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 _______________  Echocardiogram 09/20/2023: Impressions: 1. Left ventricular ejection fraction, by estimation, is 45 to 50%. The  left ventricle has mildly decreased function. The left ventricle  demonstrates global hypokinesis. There is moderate concentric left  ventricular hypertrophy. Left ventricular  diastolic parameters are consistent with Grade II diastolic dysfunction  (pseudonormalization). The average left ventricular global longitudinal  strain is -12.7 %. The global longitudinal strain is abnormal.   2. Right ventricular systolic function is normal. The right ventricular  size is mildly enlarged. There is mildly elevated pulmonary artery  systolic pressure. The estimated right ventricular systolic pressure is  38.0 mmHg.   3. Left atrial size was mild to moderately dilated.   4. Right atrial size was mildly dilated.   5. The mitral valve is normal in structure. Mild mitral valve  regurgitation. No evidence of mitral stenosis.   6. The aortic valve is tricuspid. Aortic valve regurgitation is mild. No  aortic stenosis is present.   7. The inferior vena cava is normal in size with greater than 50%  respiratory variability, suggesting right atrial pressure of 3 mmHg.    Patient Profile     83 y.o. male with a history of minimal non-obstructive CAD on cardiac catheterization in 09/2021, non-ischemic cardiomyopathy/ chronic HFrEF with EF as low  as 25-30% in 07/2021 but improved to 45-50% on Echo in 08/2023, TTR cardiac amyloidosis, paroxysmal atrial fibrillation on Eliquis, CVA in 2018, hypertension, hyperlipidemia, type 2 diabetes mellitus, hypothyroidism, GERD, and BPH. He was recently admitted from 02/14/2024 to 02/29/2024 for COVID-19 infection, rhabdomyolysis, and acute encephalopathy. Hospitalization was complicated by hypotension, vasovagal syncope, and  hyperkalemia. GDMT was adjusted and he was prescribed an abdominal binder. He was seen by the Advanced CHF team who recommended Palliative Care consult. He was seen by Palliative Care and wanted to proceed with full scope of care. He then was admitted on 03/04/2024 for sepsis secondary to sacral pressure ulcer wound infection and atrial fibrillation/ flutter with RVR after presenting with shortness of breath and pain in sacral area.   Assessment & Plan    Atrial Fibrillation/ Flutter with RVR Patient has a history of paroxysmal atrial fibrillation. He was admitted  with sepsis secondary to sacral ulcer wound infection and noted to be in atrial fibrillation/ flutter with  RVR. Rates as high as the 150s. Most recent Echo in 08/2023 showed LVEF of 45-50%. Given hypotension, he was started on IV Amiodarone.  - Currently in atrial flutter with rates in the 140s.  - Potassium 4.2 today. Magnesium pending.  - Continue IV Amiodarone.  - Continue to hold home Toprol-XL held due to hypotension and sepsis. - Could consider adding Digoxin. Renal function normal. Will discuss with MD. - Patient on chronic anticoagulation with Eliquis 5mg  twice daily. This is currently on hold in anticipation for possible wound debridement. Will discuss starting IV Heparin with MD in the interim given he has been placed on Amiodarone.  Please restart Eliquis when okay with General Surgery.  - If unable to rate control, may need to consider TEE/ DCCV. However, this would need to occur after all other procedures have been  completed and able to restart Eliquis.  Chronic HFrEF Cardiac Amyloidosis Non-Ischemic Cardiomyopathy Patient has a history of non-ischemic cardiomyopathy and TTR cardiac amyloidosis. EF as low as 25-30% in 2022 but improved to 45-50% on last Echo in 08/2023. He has been on Tafamidis and Vutrisiran for his cardiac amyloidosis. GDMT has been limited by hypotension. Chest x-ray this admission showed no acute findings.  - Euvolemic on exam.  - Home GDMT includes Toprol-XL 25mg  daily and Entersto 24-26mg  twice daily. However, both are on hold due to low BP. Continue to hold. - On Tafamadis for TTR amyloid. However, this is not on formulary. Wife has brought home medication. Will let Pharmacy know. - On Vutrisiran injection every 3 month for TTR amyloid at home as well. - Continue to monitor volume status closely, especially while on IV fluids.   Minimal Non-Obstructive CAD Noted on cardiac catheterization in 09/2021.  - No chest pain.  - No Aspirin given need for full anticoagulation. - Continue statin.   Hypertension Orthostatic Hypotension Patient has a history of hypertension complicated by orthostatic hypotension. BP currently low in setting of sepsis. - Continue to hold home Toprol-XL and Entresto for now. - Currently on IV fluids. Will need to be cautious with this and monitor volume status closely given underlying CHF.  Hyperlipidemia - Continue Lipitor 40mg  daily.  Anemia Hemoglobin 9.0 today, down from 10.8 on 03/04/2024 and 12.4 on 02/27/2024.  - Suspect some of this is dilutional as he has received >3L of IV fluids so far.  - Continue to monitor closely.   Otherwise, per primary team: - Sepsis secondary to sacral ulcer - Type 2 diabetes mellitus - Hypothyroidism - GERD - History of CVA   For questions or updates, please contact [facility] Please consult [email or web address] for contact info under        Signed, Corrin [name], PA-C  03/05/2024, 7:56 AM    I  have personally seen and examined the patient.  My HPI, Exam, and assessment and plan are below, independent of the NPP above.  Earl [name] is an 83 year old with heart failure and ATTR cardiac amyloidosis who presents with atrial flutter and a sacral ulcer.  He has a history of heart failure with reduced ejection fraction due to ATTR cardiac amyloidosis and paroxysmal atrial fibrillation. Currently, he is experiencing atrial flutter with a rapid ventricular response. He is on apixaban and has not missed any doses. He is also receiving Tafamidis from home as it is not available on formulary. No palpitations, chest pain,  or shortness of breath recently. He is unable to tolerate metoprolol due to hypotension and has been on IV amiodarone. He has minimal non-obstructive coronary arteries and is not on aspirin due to the need for full anticoagulation.  He has a sacral ulcer that developed after a prolonged admission, causing significant issues, including concern for sepsis. His daughter reports he is in ten out of ten pain despite receiving IV Dilaudid, affecting his ability to eat and perform daily activities. He is currently NPO in anticipation of potential surgery.  He has a history of insulin-dependent diabetes and hypertension. He experienced a prior stroke with residual right-sided weakness. 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  His daughter notes that he has been receiving vitamin A as appropriate.No eliquis missed.  Exam notable for  Gen: acute distress chronically ill  Neck: No JVD Cardiac: No Rubs or Gallops, no Murmur, regular tachycardia, +2 radial pulses Respiratory: Clear to auscultation bilaterally, normal effort, normal respiratory rate EXTREMITIES: Nonpitting edema in upper extremities and fingertips. SKIN: Sacral wound present.   Cardiac Studies & Procedures    ______________________________________________________________________________________________ CARDIAC CATHETERIZATION  CARDIAC CATHETERIZATION 10/07/2021  Conclusion   Mid LAD lesion is 25% stenosed.  1. Normal filling pressures. 2. Preserved cardiac output. 3. No significant coronary disease.  Nonischemic cardiomyopathy.  Findings Coronary Findings Diagnostic  Dominance: Right  Left Main Vessel was injected. Vessel is normal in caliber. Vessel is angiographically normal.  Left Anterior Descending Mid LAD lesion is 25% stenosed.  Left Circumflex Vessel was injected. Vessel is normal in caliber. Vessel is angiographically normal.  Right Coronary Artery Vessel was injected. Vessel is normal in caliber. Vessel is angiographically normal.  Intervention  No interventions have been documented.   STRESS TESTS  MYOCARDIAL PERFUSION IMAGING 08/17/2021  Narrative   Findings are consistent with no prior ischemia. The study is high risk.   No ST deviation was noted.   Left ventricular function is abnormal. Global function is severely reduced. End diastolic cavity size is severely enlarged. End systolic cavity size is severely enlarged.   Prior study not available for comparison.  Abnormal, high risk stress nuclear study with apical thinning but no ischemia or infarction.  Gated ejection fraction 27% with global hypokinesis.  Severe left ventricular enlargement.  Study interpreted as high risk due to reduced LV function.   ECHOCARDIOGRAM  ECHOCARDIOGRAM COMPLETE 09/20/2023  Narrative ECHOCARDIOGRAM REPORT    Patient Name:   Earl [name] Date of Exam: 09/20/2023 Medical Rec #:  [id number]          Height:       71.0 in Accession #:    [id number]         Weight:       161.0 lb Date of Birth:  1941-11-20           BSA:          1.923 m Patient Age:    82 years           BP:           100/54 mmHg Patient Gender: M                  HR:           68 bpm. Exam  Location:  [facility]  Procedure: 2D Echo, 3D Echo, Cardiac Doppler, Color Doppler and Strain Analysis  Indications:  CHF  History:         Patient has prior history of Echocardiogram examinations, most recent 08/17/2021. CHF and Cardiomyopathy, Stroke, Arrythmias:Atrial Fibrillation, Signs/Symptoms:Shortness of Breath; Risk Factors:Hypertension, Diabetes and Dyslipidemia. CKD.  Sonographer:     Mikki Harbor Referring Phys:  [id number] Laurey Morale Diagnosing Phys: Wilfred Lacy   Sonographer Comments: Global longitudinal strain was attempted. IMPRESSIONS   1. Left ventricular ejection fraction, by estimation, is 45 to 50%. The left ventricle has mildly decreased function. The left ventricle demonstrates global hypokinesis. There is moderate concentric left ventricular hypertrophy. Left ventricular diastolic parameters are consistent with Grade II diastolic dysfunction (pseudonormalization). The average left ventricular global longitudinal strain is -12.7 %. The global longitudinal strain is abnormal. 2. Right ventricular systolic function is normal. The right ventricular size is mildly enlarged. There is mildly elevated pulmonary artery systolic pressure. The estimated right ventricular systolic pressure is 38.0 mmHg. 3. Left atrial size was mild to moderately dilated. 4. Right atrial size was mildly dilated. 5. The mitral valve is normal in structure. Mild mitral valve regurgitation. No evidence of mitral stenosis. 6. The aortic valve is tricuspid. Aortic valve regurgitation is mild. No aortic stenosis is present. 7. The inferior vena cava is normal in size with greater than 50% respiratory variability, suggesting right atrial pressure of 3 mmHg.  FINDINGS Left Ventricle: Left ventricular ejection fraction, by estimation, is 45 to 50%. The left ventricle has mildly decreased function. The left ventricle demonstrates global hypokinesis. The average left ventricular global longitudinal  strain is -12.7 %. The global longitudinal strain is abnormal. The left ventricular internal cavity size was normal in size. There is moderate concentric left ventricular hypertrophy. Left ventricular diastolic parameters are consistent with Grade II diastolic dysfunction (pseudonormalization).  Right Ventricle: The right ventricular size is mildly enlarged. No increase in right ventricular wall thickness. Right ventricular systolic function is normal. There is mildly elevated pulmonary artery systolic pressure. The tricuspid regurgitant velocity is 2.96 m/s, and with an assumed right atrial pressure of 3 mmHg, the estimated right ventricular systolic pressure is 38.0 mmHg.  Left Atrium: Left atrial size was mild to moderately dilated.  Right Atrium: Right atrial size was mildly dilated.  Pericardium: There is no evidence of pericardial effusion.  Mitral Valve: The mitral valve is normal in structure. Mild mitral valve regurgitation. No evidence of mitral valve stenosis. MV peak gradient, 2.8 mmHg. The mean mitral valve gradient is 1.0 mmHg.  Tricuspid Valve: The tricuspid valve is normal in structure. Tricuspid valve regurgitation is mild.  Aortic Valve: The aortic valve is tricuspid. Aortic valve regurgitation is mild. Aortic regurgitation PHT measures 488 msec. No aortic stenosis is present. Aortic valve mean gradient measures 2.0 mmHg. Aortic valve peak gradient measures 5.2 mmHg. Aortic valve area, by VTI measures 2.77 cm.  Pulmonic Valve: The pulmonic valve was normal in structure. Pulmonic valve regurgitation is mild.  Aorta: The aortic root is normal in size and structure.  Venous: The inferior vena cava is normal in size with greater than 50% respiratory variability, suggesting right atrial pressure of 3 mmHg.  IAS/Shunts: No atrial level shunt detected by color flow Doppler.   LEFT VENTRICLE PLAX 2D LVIDd:         4.90 cm     Diastology LVIDs:         3.80 cm     LV e'  medial:    8.16 cm/s LV PW:         1.40 cm  LV E/e' medial:  8.9 LV IVS:        1.20 cm     LV e' lateral:   10.80 cm/s LVOT diam:     2.10 cm     LV E/e' lateral: 6.7 LV SV:         69 LV SV Index:   36          2D Longitudinal Strain LVOT Area:     3.46 cm    2D Strain GLS Avg:     -12.7 %  LV Volumes (MOD) LV vol d, MOD A2C: 79.8 ml LV vol d, MOD A4C: 91.4 ml LV vol s, MOD A2C: 42.3 ml LV vol s, MOD A4C: 42.3 ml LV SV MOD A2C:     37.5 ml LV SV MOD A4C:     91.4 ml LV SV MOD BP:      42.4 ml  RIGHT VENTRICLE RV Basal diam:  4.40 cm RV Mid diam:    4.10 cm RV S prime:     15.30 cm/s TAPSE (M-mode): 2.1 cm  LEFT ATRIUM             Index        RIGHT ATRIUM           Index LA diam:        3.80 cm 1.98 cm/m   RA Area:     24.70 cm LA Vol (A2C):   89.4 ml 46.49 ml/m  RA Volume:   82.70 ml  43.01 ml/m LA Vol (A4C):   60.5 ml 31.46 ml/m LA Biplane Vol: 75.9 ml 39.47 ml/m AORTIC VALVE                    PULMONIC VALVE AV Area (Vmax):    2.79 cm     PV Vmax:       0.88 m/s AV Area (Vmean):   3.19 cm     PV Peak grad:  3.1 mmHg AV Area (VTI):     2.77 cm AV Vmax:           114.00 cm/s AV Vmean:          71.300 cm/s AV VTI:            0.250 m AV Peak Grad:      5.2 mmHg AV Mean Grad:      2.0 mmHg LVOT Vmax:         91.80 cm/s LVOT Vmean:        65.600 cm/s LVOT VTI:          0.200 m LVOT/AV VTI ratio: 0.80 AI PHT:            488 msec  AORTA Ao Root diam: 3.70 cm Ao Asc diam:  3.30 cm  MITRAL VALVE               TRICUSPID VALVE MV Area (PHT): 2.82 cm    TR Peak grad:   35.0 mmHg MV Area VTI:   3.09 cm    TR Vmax:        296.00 cm/s MV Peak grad:  2.8 mmHg MV Mean grad:  1.0 mmHg    SHUNTS MV Vmax:       0.84 m/s    Systemic VTI:  0.20 m MV Vmean:      44.9 cm/s   Systemic Diam: 2.10 cm MV Decel Time: 269 msec MV E velocity: 72.80 cm/s MV A velocity: 54.00 cm/s  MV E/A ratio:  1.35  Dalton McleanMD Electronically signed by Archer Bear Signature  Date/Time: 09/20/2023/12:06:58 PM    Final        CARDIAC MRI  MR CARDIAC MORPHOLOGY W WO CONTRAST 12/20/2021  Narrative CLINICAL DATA:  Nonischemic cardiomyopathy.  EXAM: CARDIAC MRI  TECHNIQUE: The patient was scanned on a 1.5 Tesla GE magnet. A dedicated cardiac coil was used. Functional imaging was done using Fiesta sequences. 2,3, and 4 chamber views were done to assess for RWMA's. Modified Simpson's rule using a short axis stack was used to calculate an ejection fraction on a dedicated work [employer]. The patient received 7 cc of Gadavist. After 10 minutes inversion recovery sequences were used to assess for infiltration and scar tissue.  CONTRAST:  Gadavist 7 cc  FINDINGS: Limited images of the lung fields showed no gross abnormalities.  Normal left ventricular size and wall thickness. Diffuse hypokinesis, EF 45%. Mildly dilated right ventricle, normal systolic function with EF 48%. Mild left and right atrial dilation. Trileaflet aortic valve. No stenosis, mild to moderate aortic insufficiency. Flow sequences to confirm severity were not done. Mild tricuspid regurgitation. Mild to moderate mitral regurgitation, flow sequences to quantify were not done.  Normal first pass perfusion images.  Delayed enhancement images were difficult. There does appear to be patchy mid-wall late gadolinium enhancement (LGE) in the basal to mid septum.  MEASUREMENTS: MEASUREMENTS LVEDV 192 mL LVSV 85 mL LVEF 45%  RVEDV 199 mL RVSV 95 mL RVEF 48%  T1 1115, ECV 52%  IMPRESSION: 1. Normal LV size and wall thickness, EF 45% with diffuse hypokinesis.  2.  Mildly dilated RV with normal systolic function, EF 48%.  3. Difficult delayed enhancement imaging, but there did appear to be patchy mid-wall LGE in the basal to mid septum.  4. Elevated extracellular volume percentage, measuring to 52%. This is within the range seen with cardiac amyloidosis.  The LGE pattern potentially could be seen with amyloidosis, but LV walls are not thickened. Would recommend further workup for cardiac amyloidosis.  5. 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  Dalton Mclean   Electronically Signed By: Peder Bourdon M.D. On: 12/21/2021 11:09   ______________________________________________________________________________________________       In assessment and plan:   Atrial flutter with rapid ventricular response Currently in atrial flutter with rapid ventricular response. On IV amiodarone and unable to tolerate metoprolol due to hypotension. On Eliquis, but IV heparin is administered due to potential need to stop Eliquis for surgery. Postoperatively, TEE cardioversion may be considered. Elevated surgical risk due to comorbidities, but reasonable to proceed in the setting of sepsis. 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 Non-ischemic cardiomyopathy and autonomic dysfunction present. Unable to tolerate beta blockers and ARNI due to hypotension. On Tafamidis, ordered for home use, and can restart Vutrisiran as an outpatient. Vitamin A supplementation ongoing, and a vitamin A level will be checked. - Continue Tafamidis - Restart Vutrisiran as outpatient - Continue vitamin A supplementation - Check vitamin A level  Sepsis due to sacral ulcer Developed a sacral ulcer during a recent prolonged admission, leading to significant issues and concern for sepsis. Experiencing significant pain, affecting blood pressure. Sepsis is driving a significant amount of care. Receiving IV antibiotics with  potential evaluation for surgery. Elevated surgical risk due to  comorbidities, but reasonable to proceed in the setting of sepsis.  Anemia Anemia appears to be partially dilutional.IV heparin for now.   Gloriann Larger, MD FASE [facility] Cardiologist [facility]  [address], [address] [place], [place] [address] [phone]  12:27 PM

## 2024-03-05 NOTE — Consult Note (Signed)
 NAME:  Earl [name], MRN:  [id number], DOB:  05-06-1941, LOS: 1 ADMISSION DATE:  03/04/2024, CONSULTATION DATE: 4/15 REFERRING MD: Dr. Janalyn Shy, CHIEF COMPLAINT: Hypotension  History of Present Illness:  83 year old male with past medical history as below, which is significant for transthyretin amyloidosis, atrial fibrillation on Eliquis, failure to thrive, diabetes mellitus, stroke with residual right-sided weakness, and recent infection for COVID-19.  As stated, patient had stroke about 5 years ago with residual right-sided weakness.  He has still been living at home with help from aides several times a week as well as his daughter.  He was admitted 3/26 through 4/10 for COVID-19 infection and rhabdomyolysis.  Course was complicated by hypotension which was felt to be vasovagal in nature requiring GDMT to be stopped and he was prescribed with abdominal binder.  Looks like even before his first admission in the outpatient setting his blood pressure was 100.  Course also complicated by sacral wound.  The patient was discharged to skilled nursing where he is unfortunately remained very weak and unable to work with physical therapy.  4/14 he presented again to [facility] emergency department with complaints of sacral pain as well as shortness of breath.  Upon arrival to the emergency department he was noted to be tachycardic to the 150s and hypotensive with systolic blood pressure in the 80s.  Considering his sacral wound infection was felt to be a possible source of the hypotension.  Empiric antibiotics were initiated.  CT shows sacral decub wound with extension into the right gluteal muscles.  The patient was evaluated by general surgery and in collaboration with the family are going to pursue a conservative approach initially with antibiotics and wound care.  Despite 4 L of IV fluid the patient remained hypotensive and tachycardic.  Cardiology consulted for atrial fibrillation and initiated amiodarone  infusion.  PCCM consulted for hypotension.  Pertinent  Medical History   has a past medical history of Adenomatous colon polyp, Allergy, Anal fissure, Arthritis, BPH (benign prostatic hyperplasia), Diabetes mellitus (HCC), GERD (gastroesophageal reflux disease), Hyperlipidemia, Hypertension, Hypothyroidism, Other testicular hypofunction, and Stroke (HCC).   Significant Hospital Events: Including procedures, antibiotic start and stop dates in addition to other pertinent events     Interim History / Subjective:    Objective   Blood pressure (!) 91/49, pulse (!) 143, temperature 98 F (36.7 C), temperature source Oral, resp. rate (!) 25, height 5\' 11"  (1.803 m), weight 68.5 kg, SpO2 96%.       No intake or output data in the 24 hours ending 03/05/24 0049 Filed Weights   03/04/24 1656  Weight: 68.5 kg    Examination: General: Frail elderly gentleman in NAD HENT: [place]/AT, PERRL, no JVD Lungs: Clear bilateral breath sounds Cardiovascular: Tachy to 150s. Irregular.  Abdomen: Soft, NT, ND Extremities: No acute deformity Neuro: Awake, alert, not very communicative.  GU: Foley    Resolved Hospital Problem list     Assessment & Plan:   Hypotension: differential is broad. May be septic from his sacral wound. Urine and chest film look OK. WBC 13.3. Also per cardiology, the nature of his ATTR amyloid will make his BP quite sensitive to heart rate. PO intake has alos been poor so he is likely hypovolemic. Now s/p 4 L IVF. He is chronically hypotensive with SBP 100 at PCP visit. Lactic clearing - Prioritize rate control - Accept SBP > 85 or MAP > 60 - Empiric antibiotics - Admit to PCU - S/p 4L IVF, continue gentle  volume - Holding home antihypertensives and diuretics.  - Should he worsen we can transfer to ICU for peripheral pressors  Sacral wound - Surgery following - WOC consult   Atrial fibrillation with RVR ATTR cardia amyloid Chronic HFrEF  - management per cardiology -  Amiodarone infusion - Consider DCCV if in line with goals of care - Eliquis on hold pending surgical decisions.   DM - Per primary  CVA history - per primary  Protein calorie malnutrition severe - per pimrary  DNR/DNI    Best Practice (right click and "Reselect all SmartList Selections" daily)   Per primary  Labs   CBC: Recent Labs  Lab 02/27/24 0949 03/04/24 1706  WBC 8.9 13.3*  NEUTROABS 6.5 10.8*  HGB 12.4* 10.8*  HCT 36.6* 30.8*  MCV 86.3 84.2  PLT 339 378    Basic Metabolic Panel: Recent Labs  Lab 02/27/24 0949 03/04/24 1706  NA 139 130*  K 4.1 5.0  CL 105 100  CO2 23 21*  GLUCOSE 125* 500*  BUN 17 28*  CREATININE 0.96 1.02  CALCIUM 8.1* 7.8*   GFR: Estimated Creatinine Clearance: 54.1 mL/min (by C-G formula based on SCr of 1.02 mg/dL). Recent Labs  Lab 02/27/24 0949 03/04/24 1706 03/04/24 1744 03/04/24 2004  WBC 8.9 13.3*  --   --   LATICACIDVEN  --   --  2.8* 2.3*    Liver Function Tests: Recent Labs  Lab 03/04/24 1706  AST 60*  ALT 40  ALKPHOS 88  BILITOT 0.5  PROT 4.9*  ALBUMIN <1.5*   No results for input(s): "LIPASE", "AMYLASE" in the last 168 hours. No results for input(s): "AMMONIA" in the last 168 hours.  ABG    Component Value Date/Time   HCO3 23.4 11/14/2023 1750   TCO2 23 02/14/2024 2342   ACIDBASEDEF 1.0 11/14/2023 1750   O2SAT 98 11/14/2023 1750     Coagulation Profile: Recent Labs  Lab 03/04/24 1706  INR 1.6*    Cardiac Enzymes: Recent Labs  Lab 02/27/24 0949  CKTOTAL 501*    HbA1C: Hgb A1c MFr Bld  Date/Time Value Ref Range Status  02/22/2024 10:47 AM 8.4 (H) 4.8 - 5.6 % Final    Comment:    (NOTE) Pre diabetes:          5.7%-6.4%  Diabetes:              >6.4%  Glycemic control for   <7.0% adults with diabetes   10/31/2023 04:01 PM 6.8 (H) <5.7 % of total Hgb Final    Comment:    For someone without known diabetes, a hemoglobin A1c value of 6.5% or greater indicates that they may  have  diabetes and this should be confirmed with a follow-up  test. . For someone with known diabetes, a value <7% indicates  that their diabetes is well controlled and a value  greater than or equal to 7% indicates suboptimal  control. A1c targets should be individualized based on  duration of diabetes, age, comorbid conditions, and  other considerations. . Currently, no consensus exists regarding use of hemoglobin A1c for diagnosis of diabetes for children. .     CBG: Recent Labs  Lab 02/28/24 1118 02/28/24 1723 02/28/24 2114 02/29/24 0637 03/04/24 2244  GLUCAP 254* 288* 300* 120* 412*    Review of Systems:   Patient is encephalopathic and/or intubated; therefore, history has been obtained from chart review.    Past Medical History:  He,  has a past medical history  of Adenomatous colon polyp, Allergy, Anal fissure, Arthritis, BPH (benign prostatic hyperplasia), Diabetes mellitus (HCC), GERD (gastroesophageal reflux disease), Hyperlipidemia, Hypertension, Hypothyroidism, Other testicular hypofunction, and Stroke (HCC).   Surgical History:   Past Surgical History:  Procedure Laterality Date   CIRCUMCISION N/A 06/11/2019   Procedure: CIRCUMCISION ADULT;  Surgeon: Jerilee Field, MD;  Location: [facility] ORS;  Service: Urology;  Laterality: N/A;   COLONOSCOPY     INGUINAL HERNIA REPAIR Bilateral    1968 and 2004   LOOP RECORDER INSERTION N/A 07/28/2017   Procedure: LOOP RECORDER INSERTION;  Surgeon: Duke Salvia, MD;  Location: [facility] INVASIVE CV LAB;  Service: Cardiovascular;  Laterality: N/A;   RIGHT/LEFT HEART CATH AND CORONARY ANGIOGRAPHY N/A 10/07/2021   Procedure: RIGHT/LEFT HEART CATH AND CORONARY ANGIOGRAPHY;  Surgeon: Laurey Morale, MD;  Location: [facility] INVASIVE CV LAB;  Service: Cardiovascular;  Laterality: N/A;   TEE WITHOUT CARDIOVERSION N/A 07/28/2017   Procedure: TRANSESOPHAGEAL ECHOCARDIOGRAM (TEE);  Surgeon: Quintella Reichert, MD;  Location: [facility] ENDOSCOPY;  Service:  Cardiovascular;  Laterality: N/A;     Social History:   reports that he has never smoked. He has never used smokeless tobacco. He reports that he does not drink alcohol and does not use drugs.   Family History:  His family history includes Colon cancer in his father; Diabetes in his father; Sickle cell anemia in his mother; Stomach cancer in his father. There is no history of CAD, Esophageal cancer, or Rectal cancer.   Allergies Allergies  Allergen Reactions   Penicillins Other (See Comments)    Did it involve swelling of the face/tongue/throat, SOB, or low BP? Unknown Did it involve sudden or severe rash/hives, skin peeling, or any reaction on the inside of your mouth or nose? Unknown Did you need to seek medical attention at a hospital or doctor's office? No When did it last happen?    Over 81 Years Ago   If all above answers are "NO", may proceed with cephalosporin use.       Home Medications  Prior to Admission medications   Medication Sig Start Date End Date Taking? Authorizing Provider  Amino Acids-Protein Hydrolys (PRO-STAT AWC) LIQD Take 30 mLs by mouth in the morning and at bedtime.   Yes [provider]  atorvastatin (LIPITOR) 40 MG tablet Take  1 tablet  Daily for Cholesterol                                                       /                                  TAKE                                                       BY                                           MOUTH 01/11/24  Yes Eulis Foster, FNP  Cholecalciferol (  VITAMIN D PO) Take 1 tablet by mouth daily.    Yes [provider]  Cyanocobalamin (B-12) 50 MCG TABS Take 50 mcg by mouth daily.   Yes [provider]  ELIQUIS 5 MG TABS tablet TAKE 1 TABLET BY MOUTH TWICE  DAILY TO PREVENT BLOOD CLOTS 11/09/23  Yes [name], FNP  Emollient (AQUAPHOR ADV PROTECT HEALING) OINT Apply small amount to two areas on buttocks daily to prevent breakdown 07/19/23  Yes [name], FNP   ferrous sulfate 325 (65 FE) MG tablet Take 325 mg by mouth at bedtime.   Yes [provider]  finasteride (PROSCAR) 5 MG tablet TAKE 1 TABLET BY MOUTH DAILY FOR PROSTATE 09/15/23  Yes [name], FNP  levothyroxine (SYNTHROID) 50 MCG tablet TAKE 1 TABLET BY MOUTH DAILY ON  AN EMPTY STOMACH WITH ONLY WATER FOR 30 MINUTES AND NO ANTACID,  MEDS, CALCIUM OR MAGNESIUM FOR 4 HOURS AND AVOID BIOTIN Patient taking differently: Take 50 mcg by mouth daily before breakfast. ON AN EMPTY STOMACH WITH ONLY WATER FOR 30 MINUTES AND NO ANTACID,  MEDS, CALCIUM OR MAGNESIUM FOR 4 HOURS AND AVOID BIOTIN 05/23/23  Yes [name], FNP  Magnesium 250 MG TABS Take 250 mg by mouth daily.    Yes [provider]  metFORMIN (GLUCOPHAGE-XR) 500 MG 24 hr tablet TAKE 2 TABLETS BY MOUTH TWICE  DAILY WITH MEALS FOR DIABETES 07/31/23  Yes [name], FNP  metoprolol succinate (TOPROL-XL) 25 MG 24 hr tablet Take 1 tablet (25 mg total) by mouth at bedtime. 02/29/24 03/30/24 Yes Hughie Closs, MD  Multiple Vitamin (MULTIVITAMIN) capsule Take 1 capsule by mouth daily.   Yes [provider]  Nutritional Supplements (BOOST GLUCOSE CONTROL) LIQD Take 237 mLs by mouth 2 (two) times daily after a meal.   Yes [provider]  potassium chloride SA (KLOR-CON M20) 20 MEQ tablet Take 1 tablet by mouth once daily 09/25/23  Yes Milford, Jessica M, FNP  sacubitril-valsartan (ENTRESTO) 24-26 MG Take 1 tablet by mouth 2 (two) times daily. 01/31/24  Yes Bensimhon, Bevelyn Buckles, MD  Tafamidis [facility]) 61 MG CAPS Take 1 capsule by mouth daily. 04/12/23  Yes Laurey Morale, MD  vitamin C (ASCORBIC ACID) 500 MG tablet Take 500 mg by mouth daily.   Yes [provider]  AMVUTTRA 25 MG/0.5ML syringe INJECT 25MG  (0.5ML) SUBCUTANEOUSLY ONCE EVERY 3 MONTHS. ROTATE ADMINISTRATION SITE WITH EACH INJECTION. *REFRIGERATE. DO NOT SHAKE.* Patient not taking: Reported on 03/04/2024 10/24/23   Laurey Morale, MD   Blood Glucose Monitoring Suppl (ONE TOUCH ULTRA 2) w/Device KIT Check blood sugar 1 time a day-DX-E11.9 03/23/23   [name], MD  clotrimazole-betamethasone (LOTRISONE) cream Apply to affected area 2 to 4 x / day Patient not taking: Reported on 03/04/2024 05/17/23 05/16/24  [name], FNP  glucose blood [facility] ULTRA) test strip Use as instructed 03/23/23   [name], MD  Lancets [facility] DELICA PLUS [place]) MISC CHECK BLOOD SUGAR ONCE A DAY. 03/23/23   [name], MD  Lancets Misc. (ONE TOUCH SURESOFT) MISC Check blood sugar 1 time a day-DX-E11.91 03/23/23   [name], MD  NONFORMULARY OR COMPOUNDED ITEM Please dispense a Rolling Walker.  Dx:R26.81 & G63 12/08/22   Nita Sickle K, DO  trolamine salicylate (ASPERCREME) 10 % cream Apply 1 application topically daily as needed for muscle pain. Patient not taking: Reported on 03/04/2024    [provider]  VITAMIN A PO Take  by mouth. 3000 mg Patient not taking: Reported on 03/04/2024    [provider]     Critical care time:      Roz Cornelia, AGACNP-BC [facility]  See Amion for personal pager PCCM on call pager [phone] until 7pm. Please call Elink 7p-7a. [phone]  03/05/2024 2:38 AM

## 2024-03-05 NOTE — Consult Note (Addendum)
 WOC Nurse Consult Note: patient known to WOC team from previous consult with an unstageable sacral pressure injury; readmitted, has been evaluated by Dr. Ramiro Burly and family desires no surgical debridement at this time  Reason for Consult: sacral wound  Wound type: Unstageable Pressure Injury  Pressure Injury POA: Yes Measurement: see nursing flowsheet  Wound bed: 80% black gray stringy necrotic tissue 20% red  Drainage (amount, consistency, odor) foul smelling drainage  Periwound: skin breakdown, some dark discoloration  Dressing procedure/placement/frequency: Cleanse sacral wound with Vashe wound cleanser Timm Foot [phone]), do not rinse and allow to air dry. Apply Dakin's moistened gauze to wound bed daily x 5 days.  Cover with dry gauze and silicone foam or ABD pad. Apply a thin layer of Desitin to intact surrounding skin of buttocks 2 times daily and prn soiling.   After 5 days of Dakin's will switch to Santyl for continued debridement.  Patient should be placed on a low air loss mattress for pressure redistribution.  Will reach out to PT for possible hydrotherapy of area as well.    POC discussed with primary MD and bedside nurse.  WOC team will not follow. Re-consult if further needs arise.   Thank you,    Ronni Colace MSN, RN-BC, [employer] [phone]

## 2024-03-05 NOTE — Progress Notes (Signed)
 Physical Therapy Wound Treatment Patient Details  Name: Earl [name] MRN: [id number] Date of Birth: 1940-11-27  Today's Date: 03/05/2024 Time: 4098-1191 Time Calculation (min): 52 min  Subjective  Subjective Assessment Subjective: Pt and daughter pleasant and agreeable to wound therapy Patient and Family Stated Goals: Heal wound Date of Onset:  (Unknown) Prior Treatments: Dressing changes, off weighting  Pain Score:  Faces pain scale 4/10 - premedicated with IV pain meds  Wound Assessment  Pressure Injury 02/26/24 Coccyx Mid Unstageable - Full thickness tissue loss in which the base of the injury is covered by slough (yellow, tan, gray, green or brown) and/or eschar (tan, brown or black) in the wound bed. (Active)  Wound Image    03/05/24 1520  Dressing Type Foam - Lift dressing to assess site every shift;Gauze (Comment);Dakin's-soaked gauze;Barrier Film (skin prep);Moist to moist 03/05/24 1520  Dressing Changed 03/05/24 1520  Dressing Change Frequency Twice a day 03/05/24 1520  State of Healing Eschar 03/05/24 1520  Site / Wound Assessment Pink 03/05/24 1520  % Wound base Red or Granulating 10% 03/05/24 1520  % Wound base Yellow/Fibrinous Exudate 25% 03/05/24 1520  % Wound base Black/Eschar 65% 03/05/24 1520  % Wound base Other/Granulation Tissue (Comment) 0% 03/05/24 1520  Peri-wound Assessment Pink 03/05/24 1520  Wound Length (cm) 9 cm 03/05/24 1520  Wound Width (cm) 5 cm 03/05/24 1520  Wound Depth (cm) 3 cm 03/05/24 1520  Wound Surface Area (cm^2) 45 cm^2 03/05/24 1520  Wound Volume (cm^3) 135 cm^3 03/05/24 1520  Tunneling (cm) up to 5 cm from 12:00 to 2:00 03/05/24 1520  Undermining (cm) 0 03/05/24 1520  Margins Unattached edges (unapproximated) 03/05/24 1520  Drainage Amount Copious 03/05/24 1520  Drainage Description Purulent;Odor - foul 03/05/24 1520  Treatment Debridement (Selective);Irrigation;Other (Comment) 03/05/24 1520     Selective Debridement  (non-excisional) Selective Debridement (non-excisional) - Location: Sacrum Selective Debridement (non-excisional) - Tools Used: Forceps, Scalpel Selective Debridement (non-excisional) - Tissue Removed: Eschar    Wound Assessment and Plan  Wound Therapy - Assess/Plan/Recommendations Wound Therapy - Clinical Statement: Pt presents to wound therapy with an unstagable pressure injury on the sacrum. Noted copious drainage of purulence when pt positioned on his side and continued throughout treatment. This patient will benefit from continued selective removal of unviable tissue, to decrease bioburden, and promote wound bed healing. Wound Therapy - Functional Problem List: Decreased tolerance for position changes or OOB. Factors Delaying/Impairing Wound Healing: Diabetes Mellitus, Immobility, Infection - systemic/local, Multiple medical problems Hydrotherapy Plan: Debridement, Dressing change, Patient/family education Wound Therapy - Frequency: 2X / week Wound Therapy - Follow Up Recommendations: dressing changes by RN  Wound Therapy Goals- Improve the function of patient's integumentary system by progressing the wound(s) through the phases of wound healing (inflammation - proliferation - remodeling) by: Wound Therapy Goals - Improve the function of patient's integumentary system by progressing the wound(s) through the phases of wound healing by: Decrease Necrotic Tissue to: 20% Decrease Necrotic Tissue - Progress: Goal set today Increase Granulation Tissue to: 80% Increase Granulation Tissue - Progress: Goal set today Goals/treatment plan/discharge plan were made with and agreed upon by patient/family: Yes Time For Goal Achievement: 7 days Wound Therapy - Potential for Goals: Good  Goals will be updated until maximal potential achieved or discharge criteria met.  Discharge criteria: when goals achieved, discharge from hospital, MD decision/surgical intervention, no progress towards goals,  refusal/missing three consecutive treatments without notification or medical reason.  GP     Charges PT Wound Care Charges $Wound  Debridement up to 20 cm: < or equal to 20 cm $ Wound Debridement each add'l 20 sqcm: 2 $PT Hydrotherapy Visit: 1 Visit       Earl [name] 03/05/2024, 3:33 PM  Simone Dubois, PT, DPT Acute Rehabilitation Services Secure Chat Preferred Office: [phone]

## 2024-03-05 NOTE — Progress Notes (Signed)
 03/05/2024 Discussed with RN: lactate cleared and off pressors. 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 Will be available PRN.  Ardelle Kos MD PCCM

## 2024-03-05 NOTE — Progress Notes (Signed)
 PHARMACY - ANTICOAGULATION CONSULT NOTE  Pharmacy Consult for heparin  Indication: atrial fibrillation  Allergies  Allergen Reactions   Penicillins Other (See Comments)    Did it involve swelling of the face/tongue/throat, SOB, or low BP? Unknown Did it involve sudden or severe rash/hives, skin peeling, or any reaction on the inside of your mouth or nose? Unknown Did you need to seek medical attention at a hospital or doctor's office? No When did it last happen?    Over 24 Years Ago   If all above answers are "NO", may proceed with cephalosporin use.      Patient Measurements: Height: 5\' 11"  (180.3 cm) Weight: 68.5 kg (151 lb) IBW/kg (Calculated) : 75.3 HEPARIN DW (KG): 68.5  Vital Signs: Temp: 98.8 F (37.1 C) (04/15 1930) Temp Source: Oral (04/15 1930) BP: 111/56 (04/15 1930) Pulse Rate: 116 (04/15 1930)  Labs: Recent Labs    03/04/24 1706 03/05/24 0446 03/05/24 2004  HGB 10.8* 9.0*  --   HCT 30.8* 25.3*  --   PLT 378 270  --   APTT 54*  --  57*  LABPROT 19.7*  --   --   INR 1.6*  --   --   HEPARINUNFRC  --   --  >1.10*  CREATININE 1.02 0.82  --     Estimated Creatinine Clearance: 67.3 mL/min (by C-G formula based on SCr of 0.82 mg/dL).   Medical History: Past Medical History:  Diagnosis Date   Adenomatous colon polyp    Allergy    Anal fissure    Arthritis    BPH (benign prostatic hyperplasia)    Diabetes mellitus (HCC)    GERD (gastroesophageal reflux disease)    Hyperlipidemia    Hypertension    Hypothyroidism    Other testicular hypofunction    Stroke [facility])    Assessment: Patient admitted with CC of afib with RVR + potential sepsis and hypotension due to UTI vs. Sacral wound. Patient on Eliquis PTA, held for potential surgical intervention on sacral ulcer. No current plans for intervention per general surgery. Last dose of Eliquis on 4/14 @ 08:00am. Pharmacy consulted to dose heparin.   Heparin level >1.1 not correlating. aPTT 57 is  subtherapeutic on 1000 units/hr.   Goal of Therapy:  Heparin level 0.3-0.7 units/ml aPTT 66-102 Monitor platelets by anticoagulation protocol: Yes   Plan:  Increase heparin infusion to 1100 units/hr  F/u aPTT until correlates with heparin level  Monitor daily aPTT, heparin level, CBC, signs/symptoms of bleeding  F/u daily for potential OR plans  Dorene Gang, PharmD, BCPS, [facility] Clinical Pharmacist  Please check AMION for all [facility] Pharmacy phone numbers After 10:00 PM, call Main Pharmacy [phone]

## 2024-03-05 NOTE — Inpatient Diabetes Management (Signed)
 Inpatient Diabetes Program Recommendations  AACE/ADA: New Consensus Statement on Inpatient Glycemic Control (2015)  Target Ranges:  Prepandial:   less than 140 mg/dL      Peak postprandial:   less than 180 mg/dL (1-2 hours)      Critically ill patients:  140 - 180 mg/dL   Lab Results  Component Value Date   GLUCAP 406 (H) 03/05/2024   HGBA1C 8.4 (H) 02/22/2024    Review of Glycemic Control  Latest Reference Range & Units 03/04/24 20:04  Lactic Acid, Venous 0.5 - 1.9 mmol/L 2.3 (HH)   Diabetes history: DM 2 Outpatient Diabetes medications: metformin 1000 mg bid Current orders for Inpatient glycemic control:  Semglee 10 units Daily Novolog 0-15 units tid + hs  A1c 8.4% on 4/3  Note pt septic and Lactic Acid elevated. Pt also on Metformin at home (which carries the risk of lactic acidosis) MD to assess if pt needs to continue medication at discharge  Inpatient Diabetes Program Recommendations:    Watch on current insulin regimen ordered  Thanks,  Eloise Hake RN, MSN, BC-ADM Inpatient Diabetes Coordinator Team Pager [phone] (8a-5p)

## 2024-03-05 NOTE — Procedures (Signed)
 Excisional debridement:  1.  Progress note or procedure note with a detailed description of the procedure.  2.  Tool used for debridement (curette, scapel, etc.)  11 blade  3.  Frequency of surgical debridement.   Initial surgical debridement   4.  Measurement of total devitalized tissue (wound surface) before and after surgical debridement.   Before: ~10 cm x 10 cm with opening inferiorly After: ~10 cm x 10 cm x 3 cm and wound undermines ~6 cm superiorly  5.  Area and depth of devitalized tissue removed from wound.  8 cm x 10 cm x 1 mm  6.  Blood loss and description of tissue removed.  < 5 cc EBL, non-viable necrotic tissue   7.  Evidence of the progress of the wound's response to treatment.  A.  Current wound volume (current dimensions and depth).   ~10 cm x 10 cm x 3 cm and wound undermines ~6 cm superiorly   B.  Presence (and extent of) of infection.  Present - purulent drainage  C.  Presence (and extent of) of non viable tissue.  ~50%  D.  Other material in the wound that is expected to inhibit healing.  None   8.  Was there any viable tissue removed (measurements): No     Annetta Killian, [facility] Surgery 03/05/2024, 3:06 PM Please see Amion for pager number during day hours 7:00am-4:30pm

## 2024-03-05 NOTE — Progress Notes (Signed)
 Pharmacy Antibiotic Note  Earl [name] is a 83 y.o. male admitted on 03/04/2024 with sepsis.  Pharmacy has been consulted for vancomycin and cefepime dosing. Scr improved overnight, AUC on vancomycin 1250mg  q24h subtherapeutic at 385.   Plan: Cefepime 2g q8h  Vancomcin 1500mg  q24h, eAUC: 462. Follow culture data for de-escalation.  Monitor renal function for dose adjustments as indicated.   Height: 5\' 11"  (180.3 cm) Weight: 68.5 kg (151 lb) IBW/kg (Calculated) : 75.3  Temp (24hrs), Avg:98 F (36.7 C), Min:97.8 F (36.6 C), Max:98.1 F (36.7 C)  Recent Labs  Lab 02/27/24 0949 03/04/24 1706 03/04/24 1744 03/04/24 2004 03/05/24 0446  WBC 8.9 13.3*  --   --  10.7*  CREATININE 0.96 1.02  --   --  0.82  LATICACIDVEN  --   --  2.8* 2.3* 1.5    Estimated Creatinine Clearance: 67.3 mL/min (by C-G formula based on SCr of 0.82 mg/dL).    Allergies  Allergen Reactions   Penicillins Other (See Comments)    Did it involve swelling of the face/tongue/throat, SOB, or low BP? Unknown Did it involve sudden or severe rash/hives, skin peeling, or any reaction on the inside of your mouth or nose? Unknown Did you need to seek medical attention at a hospital or doctor's office? No When did it last happen?    Over 25 Years Ago   If all above answers are "NO", may proceed with cephalosporin use.      Antimicrobials this admission: Cefepime 4/15 >>  Vancomycin 4/15 >>   Microbiology results: 4/14 BCx:  4/15 MRSA PCR: sent   Thank you for allowing pharmacy to be a part of this patient's care.  Mamie Searles, PharmD, BCCCP  03/05/2024 7:17 AM

## 2024-03-06 DIAGNOSIS — A419 Sepsis, unspecified organism: Secondary | ICD-10-CM | POA: Diagnosis not present

## 2024-03-06 DIAGNOSIS — R6520 Severe sepsis without septic shock: Secondary | ICD-10-CM | POA: Diagnosis not present

## 2024-03-06 LAB — BASIC METABOLIC PANEL WITH GFR
Anion gap: 7 (ref 5–15)
BUN: 16 mg/dL (ref 8–23)
CO2: 20 mmol/L — ABNORMAL LOW (ref 22–32)
Calcium: 7.8 mg/dL — ABNORMAL LOW (ref 8.9–10.3)
Chloride: 109 mmol/L (ref 98–111)
Creatinine, Ser: 0.87 mg/dL (ref 0.61–1.24)
GFR, Estimated: >60 mL/min (ref >60)
Glucose, Bld: 166 mg/dL — ABNORMAL HIGH (ref 70–99)
Potassium: 4.1 mmol/L (ref 3.5–5.1)
Sodium: 136 mmol/L (ref 135–145)

## 2024-03-06 LAB — CBC
HCT: 28.5 % — ABNORMAL LOW (ref 39.0–52.0)
Hemoglobin: 9.9 g/dL — ABNORMAL LOW (ref 13.0–17.0)
MCH: 29.2 pg (ref 26.0–34.0)
MCHC: 34.7 g/dL (ref 30.0–36.0)
MCV: 84.1 fL (ref 80.0–100.0)
Platelets: 340 10*3/uL (ref 150–400)
RBC: 3.39 MIL/uL — ABNORMAL LOW (ref 4.22–5.81)
RDW: 13.6 % (ref 11.5–15.5)
WBC: 10.6 10*3/uL — ABNORMAL HIGH (ref 4.0–10.5)
nRBC: 0 % (ref 0.0–0.2)

## 2024-03-06 LAB — GLUCOSE, CAPILLARY
Glucose-Capillary: 212 mg/dL — ABNORMAL HIGH (ref 70–99)
Glucose-Capillary: 310 mg/dL — ABNORMAL HIGH (ref 70–99)
Glucose-Capillary: 284 mg/dL — ABNORMAL HIGH (ref 70–99)
Glucose-Capillary: 314 mg/dL — ABNORMAL HIGH (ref 70–99)

## 2024-03-06 LAB — HEPARIN LEVEL (UNFRACTIONATED): Heparin Unfractionated: 1 [IU]/mL — ABNORMAL HIGH (ref 0.30–0.70)

## 2024-03-06 LAB — APTT: aPTT: 73 s — ABNORMAL HIGH (ref 24–36)

## 2024-03-06 LAB — MAGNESIUM: Magnesium: 1.7 mg/dL (ref 1.7–2.4)

## 2024-03-06 LAB — PHOSPHORUS: Phosphorus: 2.1 mg/dL — ABNORMAL LOW (ref 2.5–4.6)

## 2024-03-06 MED ORDER — MAGNESIUM SULFATE 2 GM/50ML IV SOLN
2.0000 g | Freq: Once | INTRAVENOUS | Status: AC
  Administered 2024-03-06: 2 g via INTRAVENOUS
  Filled 2024-03-06: qty 50

## 2024-03-06 MED ORDER — CHLORHEXIDINE GLUCONATE CLOTH 2 % EX PADS
6.0000 | MEDICATED_PAD | Freq: Every day | CUTANEOUS | Status: DC
  Administered 2024-03-06 – 2024-06-15 (×94): 6 via TOPICAL

## 2024-03-06 MED ORDER — K PHOS MONO-SOD PHOS DI & MONO 155-852-130 MG PO TABS
500.0000 mg | ORAL_TABLET | Freq: Three times a day (TID) | ORAL | Status: DC
  Administered 2024-03-06 – 2024-05-18 (×219): 500 mg via ORAL
  Filled 2024-03-06 (×224): qty 2

## 2024-03-06 MED ORDER — FUROSEMIDE 10 MG/ML IJ SOLN
20.0000 mg | Freq: Once | INTRAMUSCULAR | Status: AC
  Administered 2024-03-06: 20 mg via INTRAVENOUS
  Filled 2024-03-06: qty 2

## 2024-03-06 NOTE — Progress Notes (Signed)
 PHARMACY - ANTICOAGULATION CONSULT NOTE  Pharmacy Consult for heparin  Indication: atrial fibrillation  Allergies  Allergen Reactions   Penicillins Other (See Comments)    Did it involve swelling of the face/tongue/throat, SOB, or low BP? Unknown Did it involve sudden or severe rash/hives, skin peeling, or any reaction on the inside of your mouth or nose? Unknown Did you need to seek medical attention at a hospital or doctor's office? No When did it last happen?    Over 86 Years Ago   If all above answers are "NO", may proceed with cephalosporin use.      Patient Measurements: Height: 5\' 11"  (180.3 cm) Weight: 68.5 kg (151 lb) IBW/kg (Calculated) : 75.3 HEPARIN DW (KG): 68.5  Vital Signs: Temp: 97.9 F (36.6 C) (04/16 0742) Temp Source: Oral (04/16 0742) BP: 105/59 (04/16 0742) Pulse Rate: 24 (04/16 0742)  Labs: Recent Labs    03/04/24 1706 03/05/24 0446 03/05/24 2004 03/06/24 0501  HGB 10.8* 9.0*  --  9.9*  HCT 30.8* 25.3*  --  28.5*  PLT 378 270  --  340  APTT 54*  --  57* 73*  LABPROT 19.7*  --   --   --   INR 1.6*  --   --   --   HEPARINUNFRC  --   --  >1.10* 1.00*  CREATININE 1.02 0.82  --  0.87    Estimated Creatinine Clearance: 63.4 mL/min (by C-G formula based on SCr of 0.87 mg/dL).   Medical History: Past Medical History:  Diagnosis Date   Adenomatous colon polyp    Allergy    Anal fissure    Arthritis    BPH (benign prostatic hyperplasia)    Diabetes mellitus (HCC)    GERD (gastroesophageal reflux disease)    Hyperlipidemia    Hypertension    Hypothyroidism    Other testicular hypofunction    Stroke [facility])    Assessment: Patient admitted with CC of afib with RVR + potential sepsis and hypotension due to UTI vs. Sacral wound. Patient on Eliquis PTA, held for potential surgical intervention on sacral ulcer. No current plans for intervention per general surgery. Last dose of Eliquis on 4/14 @ 08:00am. Pharmacy consulted to dose heparin.    -aPTT  73 and at goal on heparin 1100 units/hr -CBC stable  Goal of Therapy:  Heparin level 0.3-0.7 units/ml aPTT 66-102 Monitor platelets by anticoagulation protocol: Yes   Plan:  -Continue heparin at 1100 units/hr -Daily heparin level, aPTT and CBC  Baxter Limber, PharmD Clinical Pharmacist **Pharmacist phone directory can now be found on [email or web address] ([email or web address]).  Listed under [facility].

## 2024-03-06 NOTE — Inpatient Diabetes Management (Signed)
 Inpatient Diabetes Program Recommendations  AACE/ADA: New Consensus Statement on Inpatient Glycemic Control  Target Ranges:  Prepandial:   less than 140 mg/dL      Peak postprandial:   less than 180 mg/dL (1-2 hours)      Critically ill patients:  140 - 180 mg/dL    Latest Reference Range & Units 03/05/24 04:45 03/05/24 15:00 03/05/24 17:05 03/05/24 21:55 03/06/24 07:41  Glucose-Capillary 70 - 99 mg/dL 161 (H) 096 (H) 045 (H) 229 (H) 212 (H)    Latest Reference Range & Units 03/04/24 17:06 03/05/24 04:46 03/06/24 05:01  Glucose 70 - 99 mg/dL 409 (H) 811 (H) 914 (H)    Latest Reference Range & Units 02/22/24 10:47  Hemoglobin A1C 4.8 - 5.6 % 8.4 (H)   Review of Glycemic Control  Diabetes history: DM2 Outpatient Diabetes medications: Metformin 1000 mg BID Current orders for Inpatient glycemic control: Semglee 10 units daily, Novolog 0-15 units TID with meals, Novolog 0-5 units QHS  Inpatient Diabetes Program Recommendations:    Insulin: Noted Semglee 10 units daily started today.  NOTE: Patient admitted from SNF with severe sepsis secondary to sacral pressure ulcer, paroxsymal atrial fib with RVR, CHF, and hyperglycemia. Initial lab glucose 500 mg/dl at 7/82/95 and CBG 621 mg/dl this morning. Noted Semglee started this morning. Will watch glucose trends and make further recommendations if needed.   Thanks, Beacher Limerick, RN, MSN, CDCES Diabetes Coordinator Inpatient Diabetes Program [phone] (Team Pager from 8am to 5pm)

## 2024-03-06 NOTE — Progress Notes (Addendum)
 PROGRESS NOTE  Earl [name]  ZOX:[id number] DOB: Jul 01, 1941 DOA: 03/04/2024 PCP: [name], MD   Brief Narrative: Patient is a 83 year old male with history of paroxysmal A-fib, insulin-dependent diabetes, hypertension, hyperlipidemia, CVA with residual right-sided weakness, MGUS, GERD, BPH, hypothyroidism, HFrEF with EF of 40 to 45%, chronic pancytopenia who presented to the emergency department from SNF with complaint of shortness of breath, wound, pain in the sacral area.  He was just discharged from here to [facility] rehab 4 days ago.  Hospital course was remarkable for acute metabolic encephalopathy, hypotension, hypoglycemia.  On presentation, he was hypotensive, EKG showed A-fib with RVR, tachypnea but saturating fine on room air.  Respiratory panel negative for COVID.  PCCM consulted for persistent hypotension.  Cardiology also consulted.  General surgery consulted for sacral ulcer.  Started on broad-spectrum antibiotics, amiodarone drip.  Status post bedside excisional debridement of the sacral wound on 4/15  Assessment & Plan:  Principal Problem:   Severe sepsis (HCC) Active Problems:   Paroxysmal atrial fibrillation (HCC)   Sepsis (HCC)   Atrial flutter (HCC)   Sacral ulcer (HCC)   MGUS (monoclonal gammopathy of unknown significance)   Essential hypertension   Hyperlipidemia   Insulin dependent type 2 diabetes mellitus (HCC)   History of CVA (cerebrovascular accident)   Cardiac amyloidosis (HCC)   Chronic systolic CHF (congestive heart failure) (HCC)   GERD (gastroesophageal reflux disease)   History of hypertension   Atrial fibrillation with RVR (HCC)  Sepsis secondary to sacral pressure wound/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  Hypotensive, encephalopathic, hypoglycemic, elevated lactate, leukocytosis on presentation.  Started on IV fluid, blood  spectrum antibiotics.  Follow-up cultures.NGTD Code sepsis was activated.  PCCM recommended to continue midodrine. Blood pressure better with systolic in the range of 90s.  Currently he is alert and mostly oriented.  Chronic sacral wound with suspicion of infection: CT chest/abdomen/pelvis showed sacral decubitus wound with extension to the right gluteus muscle, no definitive bony lesions,no signs of osteomyelitis.  General surgery consulted,   Continue current antibiotics.  PT consulted for hydrotherapy.  History of paroxysmal A-fib: Presented with A-fib with RVR.  Toprol was discontinued on last admission.  Started on amiodarone drip.  Cardiology following.  Eliquis on hold for possible need of debridement, on heparin drip.  Continue digoxin  History of HFrEF/transthyretin cardiac amyloidosis/nonischemic cardiomyopathy/hypertension: Presented with hypotension.  Hanley Ben, Toprol at home,. Currently on hold.  Given IV fluids initially.  Has some edema now.  Being given 20 mg of Lasix IV once.  Cardiology team following.  Insulin-dependent diabetes type 2: Presented with hyperglycemia.  Recent A1c in the range of 8.  Diabetic coordinator consulted.  Currently on sliding scale.  Hyperglycemic.  Started on Lantus 10 units  History of CVA: Has a right-sided residual weakness.  On Eliquis, Lipitor  GERD: Continue Protonix  Anemia of chronic disease: Continue iron supplementation  Hypothyroidism: Continue levothyroxine  Recent history of COVID: COVID was positive on 02/14/2024.  Still positive.  Out of 21 days window, do not think he needs contact/airborne precaution  Severe protein calorie malnutrition/suspicion for dysphagia/hypophosphatemia: Speech therapy consulted.  PO4 will be  supplemented.  Dietitian will be consulted     Pressure Injury 02/26/24 Coccyx Mid Unstageable - Full thickness tissue loss in which the base of the injury is covered by slough (yellow, tan, gray, green or  brown) and/or eschar (tan, brown or  black) in the wound bed. (Active)  02/26/24 0916  Location: Coccyx  Location Orientation: Mid  Staging: Unstageable - Full thickness tissue loss in which the base of the injury is covered by slough (yellow, tan, gray, green or brown) and/or eschar (tan, brown or black) in the wound bed.  Wound Description (Comments):   Present on Admission: No  Dressing Type Foam - Lift dressing to assess site every shift 03/05/24 2000     Pressure Injury 02/26/24 Heel Right Deep Tissue Pressure Injury - Purple or maroon localized area of discolored intact skin or blood-filled blister due to damage of underlying soft tissue from pressure and/or shear. purple (Active)  02/26/24 1213  Location: Heel  Location Orientation: Right  Staging: Deep Tissue Pressure Injury - Purple or maroon localized area of discolored intact skin or blood-filled blister due to damage of underlying soft tissue from pressure and/or shear.  Wound Description (Comments): purple  Present on Admission:   Dressing Type Foam - Lift dressing to assess site every shift 03/05/24 2000     Pressure Injury 02/26/24 Toe (Comment  which one) Anterior;Right Deep Tissue Pressure Injury - Purple or maroon localized area of discolored intact skin or blood-filled blister due to damage of underlying soft tissue from pressure and/or shear. right gre (Active)  02/26/24 1215  Location: Toe (Comment  which one)  Location Orientation: Anterior;Right  Staging: Deep Tissue Pressure Injury - Purple or maroon localized area of discolored intact skin or blood-filled blister due to damage of underlying soft tissue from pressure and/or shear.  Wound Description (Comments): right great toe  Present on Admission:   Dressing Type Foam - Lift dressing to assess site every shift 03/05/24 2000    DVT prophylaxis:SCDs Start: 03/04/24 2151 Place TED hose Start: 03/04/24 2151     Code Status: Limited: Do not attempt resuscitation  (DNR) -DNR-LIMITED -Do Not Intubate/DNI   Family Communication: Daughter at bedside on 4/16  Patient status:Inpatient  Patient is from :home  Anticipated discharge to:not sure  Estimated DC date:not sure   Consultants:Cardiology,pccm   Procedures:None yet  Antimicrobials:  Anti-infectives (From admission, onward)    Start     Dose/Rate Route Frequency Ordered Stop   03/05/24 1930  vancomycin (VANCOREADY) IVPB 1250 mg/250 mL  Status:  Discontinued        1,250 mg 166.7 mL/hr over 90 Minutes Intravenous Every 24 hours 03/04/24 2205 03/04/24 2224   03/05/24 1930  Vancomycin (VANCOCIN) 1,250 mg in sodium chloride 0.9 % 250 mL IVPB  Status:  Discontinued        1,250 mg 166.7 mL/hr over 90 Minutes Intravenous Every 24 hours 03/04/24 2224 03/05/24 0717   03/05/24 1930  vancomycin (VANCOREADY) IVPB 1500 mg/300 mL        1,500 mg 150 mL/hr over 120 Minutes Intravenous Every 24 hours 03/05/24 0717     03/05/24 1400  ceFEPIme (MAXIPIME) 2 g in sodium chloride 0.9 % 100 mL IVPB        2 g 200 mL/hr over 30 Minutes Intravenous Every 8 hours 03/05/24 0717     03/05/24 0600  ceFEPIme (MAXIPIME) 2 g in sodium chloride 0.9 % 100 mL IVPB  Status:  Discontinued        2 g 200 mL/hr over 30 Minutes Intravenous Every 12 hours 03/04/24 2205 03/05/24 0717   03/05/24 0000  metroNIDAZOLE (FLAGYL) IVPB 500 mg  Status:  Discontinued        500 mg 100 mL/hr over 60 Minutes  Intravenous Every 12 hours 03/04/24 2206 03/04/24 2209   03/04/24 1730  aztreonam (AZACTAM) 2 g in sodium chloride 0.9 % 100 mL IVPB  Status:  Discontinued        2 g 200 mL/hr over 30 Minutes Intravenous  Once 03/04/24 1715 03/04/24 1724   03/04/24 1730  metroNIDAZOLE (FLAGYL) IVPB 500 mg        500 mg 100 mL/hr over 60 Minutes Intravenous Every 12 hours 03/04/24 1715 03/11/24 1714   03/04/24 1730  vancomycin (VANCOREADY) IVPB 1500 mg/300 mL        1,500 mg 150 mL/hr over 120 Minutes Intravenous  Once 03/04/24 1715 03/04/24  2153   03/04/24 1730  ceFEPIme (MAXIPIME) 2 g in sodium chloride 0.9 % 100 mL IVPB        2 g 200 mL/hr over 30 Minutes Intravenous  Once 03/04/24 1724 03/04/24 1826       Subjective: Patient seen and examined at bedside today.  Lying in bed.  Heart rate better than yesterday.  Blood pressure systolic in the range of 100.  Appears weak and deconditioned, not in distress.  Daughter concerned about some swelling of the hands and left side of the face.  He was also not able to swallow this morning.  Objective: Vitals:   03/06/24 0230 03/06/24 0334 03/06/24 0537 03/06/24 0742  BP: (!) 98/53 (!) 95/48 98/65 (!) 105/59  Pulse:   (!) 104 (!) 24  Resp: 17 18 (!) 21   Temp:   100.1 F (37.8 C) 97.9 F (36.6 C)  TempSrc:   Skin Oral  SpO2: 100% 100% 100%   Weight:      Height:        Intake/Output Summary (Last 24 hours) at 03/06/2024 1258 Last data filed at 03/06/2024 1100 Gross per 24 hour  Intake 2494.91 ml  Output 1750 ml  Net 744.91 ml   Filed Weights   03/04/24 1656  Weight: 68.5 kg    Examination:   General exam: Weak and deconditioned, lying on  bed, chronically ill looking Respiratory system:  no wheezes or crackles , diminished with sound bases Cardiovascular system: Irregular regular rhythm Gastrointestinal system: Abdomen is nondistended, soft and nontender. Central nervous system: Alert and oriented Extremities: trace edema of upper extremities, no clubbing ,no cyanosis Skin: No rashes, no ulcers,no icterus  , sacral pressure ulcer    Data Reviewed: I have personally reviewed following labs and imaging studies  CBC: Recent Labs  Lab 03/04/24 1706 03/05/24 0446 03/06/24 0501  WBC 13.3* 10.7* 10.6*  NEUTROABS 10.8*  --   --   HGB 10.8* 9.0* 9.9*  HCT 30.8* 25.3* 28.5*  MCV 84.2 84.1 84.1  PLT 378 270 340   Basic Metabolic Panel: Recent Labs  Lab 03/04/24 1706 03/05/24 0446 03/05/24 1412 03/06/24 0501  NA 130* 135  --  136  K 5.0 4.2  --  4.1   CL 100 106  --  109  CO2 21* 23  --  20*  GLUCOSE 500* 416*  --  166*  BUN 28* 18  --  16  CREATININE 1.02 0.82  --  0.87  CALCIUM 7.8* 7.9*  --  7.8*  MG  --   --  1.7 1.7  PHOS  --   --   --  2.1*     Recent Results (from the past 240 hours)  Resp panel by RT-PCR (RSV, Flu A&B, Covid) Anterior Nasal Swab     Status: Abnormal  Collection Time: 03/04/24  5:38 PM   Specimen: Anterior Nasal Swab  Result Value Ref Range Status   SARS Coronavirus 2 by RT PCR POSITIVE (A) NEGATIVE Final   Influenza A by PCR NEGATIVE NEGATIVE Final   Influenza B by PCR NEGATIVE NEGATIVE Final    Comment: (NOTE) The Xpert Xpress SARS-CoV-2/FLU/RSV plus assay is intended as an aid in the diagnosis of influenza from Nasopharyngeal swab specimens and should not be used as a sole basis for treatment. Nasal washings and aspirates are unacceptable for Xpert Xpress SARS-CoV-2/FLU/RSV testing.  Fact Sheet for Patients: BloggerCourse.com  Fact Sheet for Healthcare Providers: SeriousBroker.it  This test is not yet approved or cleared by the [place]  FDA and has been authorized for detection and/or diagnosis of SARS-CoV-2 by FDA under an Emergency Use Authorization (EUA). This EUA will remain in effect (meaning this test can be used) for the duration of the COVID-19 declaration under Section 564(b)(1) of the Act, 21 U.S.C. section 360bbb-3(b)(1), unless the authorization is terminated or revoked.     Resp Syncytial Virus by PCR NEGATIVE NEGATIVE Final    Comment: (NOTE) Fact Sheet for Patients: BloggerCourse.com  Fact Sheet for Healthcare Providers: SeriousBroker.it  This test is not yet approved or cleared by the [place]  FDA and has been authorized for detection and/or diagnosis of SARS-CoV-2 by FDA under an Emergency Use Authorization (EUA). This EUA will remain in effect (meaning this  test can be used) for the duration of the COVID-19 declaration under Section 564(b)(1) of the Act, 21 U.S.C. section 360bbb-3(b)(1), unless the authorization is terminated or revoked.  Performed at [facility], [address], [place], [place] [address]   Culture, blood (x 2)     Status: None (Preliminary result)   Collection Time: 03/04/24  5:44 PM   Specimen: BLOOD LEFT HAND  Result Value Ref Range Status   Specimen Description BLOOD LEFT HAND  Final   Special Requests   Final    BOTTLES DRAWN AEROBIC ONLY Blood Culture adequate volume   Culture   Final    NO GROWTH 2 DAYS Performed at [facility], [address], [place], [place] [address]    Report Status PENDING  Incomplete  Culture, blood (x 2)     Status: None (Preliminary result)   Collection Time: 03/04/24  6:14 PM   Specimen: BLOOD RIGHT HAND  Result Value Ref Range Status   Specimen Description BLOOD RIGHT HAND  Final   Special Requests   Final    BOTTLES DRAWN AEROBIC ONLY Blood Culture results may not be optimal due to an inadequate volume of blood received in culture bottles   Culture   Final    NO GROWTH 2 DAYS Performed at [facility], [address], [place], [place] [address]    Report Status PENDING  Incomplete     Radiology Studies: CT CHEST ABDOMEN PELVIS W CONTRAST Result Date: 03/04/2024 CLINICAL DATA:  Possible sepsis and sacral wound EXAM: CT CHEST, ABDOMEN, AND PELVIS WITH CONTRAST TECHNIQUE: Multidetector CT imaging of the chest, abdomen and pelvis was performed following the standard protocol during bolus administration of intravenous contrast. RADIATION DOSE REDUCTION: This exam was performed according to the departmental dose-optimization program which includes automated exposure control, adjustment of the mA and/or kV according to patient size and/or use of iterative reconstruction technique. CONTRAST:  75mL OMNIPAQUE IOHEXOL 350 MG/ML SOLN COMPARISON:  None Available. FINDINGS:  CT CHEST FINDINGS Cardiovascular: Atherosclerotic calcifications of the  thoracic aorta are noted without aneurysmal dilatation or dissection. Heart is mildly enlarged in size. The pulmonary artery shows a normal branching pattern bilaterally. No central pulmonary embolus is noted although not timed for embolus evaluation. Mediastinum/Nodes: The esophagus is within normal limits. No hilar or mediastinal adenopathy is noted. A large peripherally enhancing and partially calcified nodule is noted in the left lobe thyroid measuring up to 5 cm. This has been previously biopsied and no further follow-up is recommended. Lipoma of the strap muscles on the left is again seen and stable. Lungs/Pleura: The lungs are well aerated bilaterally. Small left pleural effusion and left basilar atelectasis is noted. Minimal right basilar atelectasis is seen as well. No focal confluent infiltrate is noted. No focal nodule is seen. Musculoskeletal: Degenerative changes of the thoracic spine are noted. No acute bony abnormality is seen. CT ABDOMEN PELVIS FINDINGS Hepatobiliary: No focal liver abnormality is seen. No gallstones, gallbladder wall thickening, or biliary dilatation. Pancreas: Unremarkable. No pancreatic ductal dilatation or surrounding inflammatory changes. Spleen: Normal in size without focal abnormality. Adrenals/Urinary Tract: Adrenal glands are within normal limits. Kidneys demonstrate a normal enhancement pattern bilaterally. No renal calculi are seen. A few small cysts are noted. No follow-up is recommended. The bladder is well distended. Stomach/Bowel: Scattered fecal material is noted throughout the colon without obstructive change. The appendix is not well visualized. No inflammatory changes to suggest appendicitis are noted. Small bowel and stomach are within normal limits. Vascular/Lymphatic: Aortic atherosclerosis. No enlarged abdominal or pelvic lymph nodes. Reproductive: Prostate is unremarkable. Other: No  abdominal wall hernia or abnormality. No abdominopelvic ascites. Musculoskeletal: Sacral decubitus wound is noted although no bony changes are seen at this time. The wound extends to the margin of the sacrum as well as within the gluteal muscles on the right. No discrete drainable abscess is noted. IMPRESSION: CT of the chest: Small left pleural effusion and left basilar atelectasis. Mild right basilar atelectasis is noted as well. CT of the abdomen and pelvis: Sacral decubitus wound with extension into the right gluteal muscles. No definitive bony erosive changes are seen. Scattered fecal material within the colon without obstructive change. Electronically Signed   By: Alcide Clever M.D.   On: 03/04/2024 21:39   DG Chest Port 1 View Result Date: 03/04/2024 CLINICAL DATA:  Possible sepsis EXAM: PORTABLE CHEST 1 VIEW COMPARISON:  02/21/2024 FINDINGS: Cardiac shadow is stable. Tortuous thoracic aorta is again noted. The lungs are well aerated without focal infiltrate or effusion. No bony abnormality is seen. IMPRESSION: No active disease. Electronically Signed   By: Alcide Clever M.D.   On: 03/04/2024 19:59    Scheduled Meds:  atorvastatin  40 mg Oral Daily   Chlorhexidine Gluconate Cloth  6 each Topical Daily   [START ON 03/10/2024] collagenase   Topical Daily   digoxin  0.125 mg Oral Daily   ferrous sulfate  325 mg Oral QHS   insulin aspart  0-15 Units Subcutaneous TID WC   insulin aspart  0-5 Units Subcutaneous QHS   insulin glargine-yfgn  10 Units Subcutaneous Daily   levothyroxine  50 mcg Oral Q0600   liver oil-zinc oxide   Topical BID   midodrine  5 mg Oral TID WC   phosphorus  500 mg Oral TID   sodium chloride flush  3 mL Intravenous Q12H   sodium hypochlorite   Irrigation BID   Tafamidis  1 capsule Oral Daily   Continuous Infusions:  amiodarone 30 mg/hr (03/06/24 0851)   ceFEPime (MAXIPIME)  IV 2 g (03/06/24 0617)   heparin 1,100 Units/hr (03/06/24 1031)   metronidazole 500 mg (03/06/24  0510)   vancomycin Stopped (03/06/24 0000)     LOS: 2 days   Leona Rake, MD [facility] P4/16/2025, 12:58 PM

## 2024-03-06 NOTE — Progress Notes (Addendum)
 Patient Name: Earl [name] Date of Encounter: 03/06/2024 [facility] Cardiologist: Sherryl Manges, MD    Interval Summary  .    Patient's daughter is at bedside. Reports that patient has been able to chew his food, but has not been swallowing. He has had normal work of breathing. Appears swollen in his hands and feet. Daughter concerned because patient has not been moved in the bed and he has been sitting in the same position since last night   Vital Signs .    Vitals:   03/06/24 0230 03/06/24 0334 03/06/24 0537 03/06/24 0742  BP: (!) 98/53 (!) 95/48 98/65 (!) 105/59  Pulse:   (!) 104 (!) 24  Resp: 17 18 (!) 21   Temp:   100.1 F (37.8 C) 97.9 F (36.6 C)  TempSrc:   Skin Oral  SpO2: 100% 100% 100%   Weight:      Height:        Intake/Output Summary (Last 24 hours) at 03/06/2024 0856 Last data filed at 03/06/2024 0353 Gross per 24 hour  Intake 2494.91 ml  Output 2550 ml  Net -55.09 ml      03/04/2024    4:56 PM 02/14/2024    7:52 PM 01/24/2024   11:05 AM  Last 3 Weights  Weight (lbs) 151 lb 159 lb 159 lb  Weight (kg) 68.493 kg 72.122 kg 72.122 kg      Telemetry/ECG    Atrial flutter with variable conduction, HR in the 100s - Personally Reviewed  Physical Exam .   GEN: Chronically ill appearing. No acute distress. Sitting upright in the bed  Neck: No JVD Cardiac: Irregular rate and rhythm, no murmurs, rubs, or gallops.  Respiratory: Clear to auscultation bilaterally. Normal WOB on room air  GI: Soft, nontender, non-distended  MS: Bilateral hands and feet are mildly swollen   Assessment & Plan .     Atrial Fib/flutter with RVR  - Patient has history of paroxysmal atrial fibrillation, now admitted with sepsis secondary to sacral ulcer wound infection. Found to be in atrial flutter with RVR, HR as high as the 150  - Now on IV amiodarone (due to low BP) and digoxin. Per telemetry, he remains in atrial flutter with variable AV block, HR in the 100s  -  BP continues to be low, 105/59 this AM, in the 90s systolic overnight - No BP room for rate controlling medications - Continue IV amiodarone infusion and PO digoxin 0.125 mg daily  - Eliquis held and patient is on IV heparin after wound debridement yesterday. Plan to resume eliquis when OK with surgery   Chronic HFrEF Cardiac Amyloidosis  Non-ischemic cardiomyopathy  - Patient has history of non-ischemic cardiomyopathy and TRR cardiac amyloidosis. EF previously as low as 25-30% in 2022. Most recent echo from 08/2023 showed EF 45-50%, grade II DD, normal RV systolic function - Patient hypotensive- home entresto, metoprolol held - Continue Tafamadis for TRR amyloid- note this is not on formulary, wife brought home medication. Pharmacy is aware  - On Vutrisiran injection every 3 month for TTR amyloid at home as well  - He has received some IV fluids for low BP- appears to have mild edema in  bilateral upper and lower extremities. Daughter reports his face appears more swollen than usual  - Give a one time dose of IV lasix 20 mg today to see if volume status improves   Minimal non-obstructive CAD - Noted on cath from 09/2021 - No ASA as he  is anticoagulated for afib  - Continue statin therapy    Hypertension  Orthostatic hypotension  - BP currently low in the setting of sepsis  - OK to hold home metoprolol and entresto  - Continue midodrine 5 mg TID   Anemia  - Hemoglobin 9.0 yesterday, down from 10.8 on 03/04/2024 and 12.4 on 02/27/2024.  - Suspect some of this is dilutional as he has received >3L of IV fluids so far.  - Hemoglobin improved to 9.9 today   Swallowing difficulty  - Daughter reports that patient was able to swallow liquids but could not swallow solids this AM.  - Ordered SLP eval   Otherwise per primary  - sepsis, sacral ulcer  - Type 2 DM  - Hyperthyroidism  - GERD  - History of CVA   For questions or updates, please contact [facility] Please consult  [email or web address] for contact info under        Signed, Debria Fang, PA-C   I have personally seen and examined the patient.  My HPI, Exam, and assessment and plan are below, independent of the NPP above.  [name] is a 83 year old with cardiac amyloidosis and atrial flutter who presents with complications from a sacral wound and sepsis.  He has a history of cardiac amyloidosis and is currently experiencing atrial flutter with a heart rate of 110, which increases with activity. His history of congestive heart failure complicates fluid management due to the risk of low blood pressure.  He is dealing with a sacral wound that has led to sepsis, for which he underwent surgery during his current admission.  He is experiencing significant swelling, which may be related to his nutritional status. He has difficulty swallowing, impacting his ability to eat and maintain nutrition. He chews food but struggles to swallow, although he can swallow liquids. This has led to concerns about potential aspiration and pneumonia.  He has had issues with IV access, affecting the administration of his medications.  Exam notable for  Gen: chronically ill appearing Neck: No JVD,swollen Cardiac: No Rubs or Gallops, no Murmur, IRIR tachycardia,  radial pulses Respiratory: Clear to auscultation bilaterally Integument: edematous   Atrial fibrillation with rapid ventricular response He presents with atrial fibrillation with rapid ventricular response, complicated by cardiac amyloidosis, congestive heart failure, and hypotension. Heart rate is elevated at 110 bpm with increased activity. Management requires careful fluid balance to reduce fluid overload while avoiding hypotension.  Will resume amiodarone when IV access is stable.  Heparin until no further surgeries  Nutritional deficiency and swallowing difficulty He has nutritional deficiency and difficulty swallowing, increasing the risk of aspiration  pneumonia. He struggles to swallow even liquids, necessitating safe swallowing management to prevent complications. - Assess swallowing safety to prevent aspiration pneumonia - IV lasix given  - on midodrine for BP support  ATTR-CA, PN - Vitamin A is pending - Vutrisiran as outpatient - tafamadis when able to swallow   Gloriann Larger, MD FASE [facility] Cardiologist [facility]  [address] [place], [address] [place], [place] [address] [phone]  12:28 PM

## 2024-03-06 NOTE — Evaluation (Signed)
 Clinical/Bedside Swallow Evaluation Patient Details  Name: Birney Belshe MRN: [id number] Date of Birth: December 06, 1940  Today's Date: 03/06/2024 Time: SLP Start Time (ACUTE ONLY): 1235 SLP Stop Time (ACUTE ONLY): 1255 SLP Time Calculation (min) (ACUTE ONLY): 20 min  Past Medical History:  Past Medical History:  Diagnosis Date   Adenomatous colon polyp    Allergy    Anal fissure    Arthritis    BPH (benign prostatic hyperplasia)    Diabetes mellitus (HCC)    GERD (gastroesophageal reflux disease)    Hyperlipidemia    Hypertension    Hypothyroidism    Other testicular hypofunction    Stroke [facility])    Past Surgical History:  Past Surgical History:  Procedure Laterality Date   CIRCUMCISION N/A 06/11/2019   Procedure: CIRCUMCISION ADULT;  Surgeon: Jerilee Field, MD;  Location: [facility] ORS;  Service: Urology;  Laterality: N/A;   COLONOSCOPY     INGUINAL HERNIA REPAIR Bilateral    1968 and 2004   LOOP RECORDER INSERTION N/A 07/28/2017   Procedure: LOOP RECORDER INSERTION;  Surgeon: Duke Salvia, MD;  Location: [facility] INVASIVE CV LAB;  Service: Cardiovascular;  Laterality: N/A;   RIGHT/LEFT HEART CATH AND CORONARY ANGIOGRAPHY N/A 10/07/2021   Procedure: RIGHT/LEFT HEART CATH AND CORONARY ANGIOGRAPHY;  Surgeon: Laurey Morale, MD;  Location: [facility] INVASIVE CV LAB;  Service: Cardiovascular;  Laterality: N/A;   TEE WITHOUT CARDIOVERSION N/A 07/28/2017   Procedure: TRANSESOPHAGEAL ECHOCARDIOGRAM (TEE);  Surgeon: Quintella Reichert, MD;  Location: [facility] ENDOSCOPY;  Service: Cardiovascular;  Laterality: N/A;   HPI:  Patient is an 83 y.o. 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 He was discharged from the hospital (admitted for acute metabolic encephalopathy, hypotensive, hypoglycemic) four days ago to [facility] for rehab. He presented back to the hospital on 03/04/24 with SOB and pain in sacral area. 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 SLP ordered due to suspicious for dysphagia.    Assessment / Plan / Recommendation  Clinical Impression  Patient presents with clinical s/s of dysphagia as per this bedside swallow evaluation. In addition, he has a h/o dysphagia from 2021 CVA  and OP MBS completed 12/28/2022 completed which reported aspiration (silent and sensed) with both nectar thick and thin liquids and recommended dys 3 (mechanical soft) solids and thin liquids with chin tuck. His daughter indicated that patient was eating well prior to recent hospitalizations. This morning (4/16), she had concerns as patient was holding even puree solids in his mouth and seeming to "chew" PO's but would not swallow them. As this was different from his past s/s dysphagia, she was concerned. Patient was awake but was lying on side due to pain from sacral wound. He grimaces in pain when moved, even when HOB elevated. He was receptive to PO's of thin liquids and puree solids. Swallow intitiation with thin liquids appeared delayed and although no immediate overt s/s, he did exhibit a delayed cough after consecutive straw sips of thin liquids. He tolerated puree solids without observed s/s. SLP discussed recommendations with daughter. SLP and daughter both in agreement that transporting him for a modified barium swallow study would be too painful for him. SLP is recommending he continue on current PO diet but for daughter (who is very involved and orders his meals for him here  at the hospital), focus on foods with moisture and that require minimal or no chewing. SLP will follow for toleration. SLP Visit Diagnosis: Dysphagia, unspecified (R13.10)    Aspiration Risk  Mild aspiration risk    Diet Recommendation Other (Comment) (recommended that daughter order foods that  require minimal to no chewing and have moisture as opposed to dry.)    Liquid Administration via: Cup;Straw Medication Administration: Crushed with puree Supervision: Full supervision/cueing for compensatory strategies Compensations: Slow rate;Small sips/bites Postural Changes: Seated upright at 90 degrees;Remain upright for at least 30 minutes after po intake    Other  Recommendations Oral Care Recommendations: Oral care BID    Recommendations for follow up therapy are one component of a multi-disciplinary discharge planning process, led by the attending physician.  Recommendations may be updated based on patient status, additional functional criteria and insurance authorization.  Follow up Recommendations Other (comment) (TBD)      Assistance Recommended at Discharge    Functional Status Assessment Patient has had a recent decline in their functional status and demonstrates the ability to make significant improvements in function in a reasonable and predictable amount of time.  Frequency and Duration min 2x/week  1 week       Prognosis Prognosis for improved oropharyngeal function: Fair Barriers to Reach Goals: Time post onset      Swallow Study   General Date of Onset: 03/06/24 HPI: Patient is an 83 y.o. 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 He was discharged from the hospital (admitted for acute metabolic encephalopathy, hypotensive, hypoglycemic) four days ago to [facility] for rehab. He presented back to the hospital on 03/04/24 with SOB and pain in sacral area. 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 SLP ordered due to suspicious for dysphagia. Type of Study: Bedside Swallow Evaluation Previous Swallow Assessment: OP MBS  February 2024; rec: dys 3, thin with chin tuck Diet Prior to this Study: Regular;Thin liquids (Level 0) Temperature Spikes Noted: No Respiratory Status: Room air History of Recent Intubation: No Behavior/Cognition: Alert;Lethargic/Drowsy;Cooperative;Pleasant mood Oral Cavity Assessment: Dry Oral Care Completed by SLP: Yes Oral Cavity - Dentition: Dentures, top;Dentures, bottom Self-Feeding Abilities: Total assist Patient Positioning: Upright in bed Baseline Vocal Quality: Low vocal intensity;Hoarse Volitional Swallow: Able to elicit    Oral/Motor/Sensory Function Overall Oral Motor/Sensory Function: Generalized oral weakness   Ice Chips     Thin Liquid Thin Liquid: Impaired Presentation: Straw Pharyngeal  Phase Impairments: Suspected delayed Swallow;Cough - Delayed    Nectar Thick     Honey Thick     Puree Puree: Within functional limits Presentation: Spoon   Solid     Solid: Not tested     Jacqualine Mater, MA, CCC-SLP Speech Therapy

## 2024-03-06 NOTE — Plan of Care (Signed)
  Problem: Fluid Volume: Goal: Hemodynamic stability will improve Outcome: Progressing   Problem: Clinical Measurements: Goal: Diagnostic test results will improve Outcome: Progressing Goal: Signs and symptoms of infection will decrease Outcome: Progressing   Problem: Respiratory: Goal: Ability to maintain adequate ventilation will improve Outcome: Progressing   Problem: Education: Goal: Knowledge of risk factors and measures for prevention of condition will improve Outcome: Progressing   Problem: Coping: Goal: Psychosocial and spiritual needs will be supported Outcome: Progressing   Problem: Respiratory: Goal: Will maintain a patent airway Outcome: Progressing Goal: Complications related to the disease process, condition or treatment will be avoided or minimized Outcome: Progressing   Problem: Education: Goal: Ability to describe self-care measures that may prevent or decrease complications (Diabetes Survival Skills Education) will improve Outcome: Progressing Goal: Individualized Educational Video(s) Outcome: Progressing   Problem: Coping: Goal: Ability to adjust to condition or change in health will improve Outcome: Progressing   Problem: Fluid Volume: Goal: Ability to maintain a balanced intake and output will improve Outcome: Progressing   Problem: Health Behavior/Discharge Planning: Goal: Ability to identify and utilize available resources and services will improve Outcome: Progressing Goal: Ability to manage health-related needs will improve Outcome: Progressing   Problem: Metabolic: Goal: Ability to maintain appropriate glucose levels will improve Outcome: Progressing   Problem: Nutritional: Goal: Maintenance of adequate nutrition will improve Outcome: Progressing Goal: Progress toward achieving an optimal weight will improve Outcome: Progressing   Problem: Skin Integrity: Goal: Risk for impaired skin integrity will decrease Outcome: Progressing    Problem: Tissue Perfusion: Goal: Adequacy of tissue perfusion will improve Outcome: Progressing   Problem: Education: Goal: Knowledge of General Education information will improve Description: Including pain rating scale, medication(s)/side effects and non-pharmacologic comfort measures Outcome: Progressing   Problem: Health Behavior/Discharge Planning: Goal: Ability to manage health-related needs will improve Outcome: Progressing   Problem: Clinical Measurements: Goal: Ability to maintain clinical measurements within normal limits will improve Outcome: Progressing Goal: Will remain free from infection Outcome: Progressing Goal: Diagnostic test results will improve Outcome: Progressing Goal: Respiratory complications will improve Outcome: Progressing Goal: Cardiovascular complication will be avoided Outcome: Progressing   Problem: Activity: Goal: Risk for activity intolerance will decrease Outcome: Progressing   Problem: Nutrition: Goal: Adequate nutrition will be maintained Outcome: Progressing   Problem: Coping: Goal: Level of anxiety will decrease Outcome: Progressing   Problem: Elimination: Goal: Will not experience complications related to bowel motility Outcome: Progressing Goal: Will not experience complications related to urinary retention Outcome: Progressing   Problem: Pain Managment: Goal: General experience of comfort will improve and/or be controlled Outcome: Progressing   Problem: Safety: Goal: Ability to remain free from injury will improve Outcome: Progressing   Problem: Skin Integrity: Goal: Risk for impaired skin integrity will decrease Outcome: Progressing

## 2024-03-07 ENCOUNTER — Encounter: Admitting: Cardiology

## 2024-03-07 ENCOUNTER — Other Ambulatory Visit: Payer: Self-pay

## 2024-03-07 DIAGNOSIS — A419 Sepsis, unspecified organism: Secondary | ICD-10-CM | POA: Diagnosis not present

## 2024-03-07 DIAGNOSIS — R6520 Severe sepsis without septic shock: Secondary | ICD-10-CM | POA: Diagnosis not present

## 2024-03-07 LAB — CBC
HCT: 27.3 % — ABNORMAL LOW (ref 39.0–52.0)
Hemoglobin: 9.4 g/dL — ABNORMAL LOW (ref 13.0–17.0)
MCH: 29 pg (ref 26.0–34.0)
MCHC: 34.4 g/dL (ref 30.0–36.0)
MCV: 84.3 fL (ref 80.0–100.0)
Platelets: 297 10*3/uL (ref 150–400)
RBC: 3.24 MIL/uL — ABNORMAL LOW (ref 4.22–5.81)
RDW: 13.8 % (ref 11.5–15.5)
WBC: 11.1 10*3/uL — ABNORMAL HIGH (ref 4.0–10.5)
nRBC: 0 % (ref 0.0–0.2)

## 2024-03-07 LAB — PHOSPHORUS: Phosphorus: 2.4 mg/dL — ABNORMAL LOW (ref 2.5–4.6)

## 2024-03-07 LAB — GLUCOSE, CAPILLARY
Glucose-Capillary: 236 mg/dL — ABNORMAL HIGH (ref 70–99)
Glucose-Capillary: 347 mg/dL — ABNORMAL HIGH (ref 70–99)
Glucose-Capillary: 290 mg/dL — ABNORMAL HIGH (ref 70–99)
Glucose-Capillary: 278 mg/dL — ABNORMAL HIGH (ref 70–99)

## 2024-03-07 LAB — APTT: aPTT: 76 s — ABNORMAL HIGH (ref 24–36)

## 2024-03-07 LAB — HEPARIN LEVEL (UNFRACTIONATED): Heparin Unfractionated: 0.58 [IU]/mL (ref 0.30–0.70)

## 2024-03-07 MED ORDER — SENNOSIDES-DOCUSATE SODIUM 8.6-50 MG PO TABS
1.0000 | ORAL_TABLET | Freq: Two times a day (BID) | ORAL | Status: DC
  Administered 2024-03-07 – 2024-03-17 (×21): 1 via ORAL
  Filled 2024-03-07 (×21): qty 1

## 2024-03-07 MED ORDER — VITAMIN C 500 MG PO TABS
500.0000 mg | ORAL_TABLET | Freq: Two times a day (BID) | ORAL | Status: AC
  Administered 2024-03-07 – 2024-04-05 (×60): 500 mg via ORAL
  Filled 2024-03-07 (×60): qty 1

## 2024-03-07 MED ORDER — GLUCERNA SHAKE PO LIQD
237.0000 mL | Freq: Three times a day (TID) | ORAL | Status: DC
  Administered 2024-03-07 – 2024-03-14 (×21): 237 mL via ORAL

## 2024-03-07 MED ORDER — VITAMIN C 500 MG PO TABS: 500.0000 mg | ORAL_TABLET | Freq: Two times a day (BID) | ORAL | Status: DC

## 2024-03-07 MED ORDER — MEDIHONEY WOUND/BURN DRESSING EX PSTE
1.0000 | PASTE | Freq: Every day | CUTANEOUS | Status: DC
  Administered 2024-03-07 – 2024-03-14 (×8): 1 via TOPICAL
  Filled 2024-03-07: qty 44

## 2024-03-07 MED ORDER — ZINC SULFATE 220 (50 ZN) MG PO CAPS
220.0000 mg | ORAL_CAPSULE | Freq: Every day | ORAL | Status: AC
  Administered 2024-03-07 – 2024-03-20 (×14): 220 mg via ORAL
  Filled 2024-03-07 (×14): qty 1

## 2024-03-07 MED ORDER — METRONIDAZOLE 500 MG PO TABS
500.0000 mg | ORAL_TABLET | Freq: Two times a day (BID) | ORAL | Status: AC
Start: 2024-03-07 — End: 2024-03-11
  Administered 2024-03-07 – 2024-03-11 (×9): 500 mg via ORAL
  Filled 2024-03-07 (×9): qty 1

## 2024-03-07 MED ORDER — POLYETHYLENE GLYCOL 3350 17 G PO PACK
17.0000 g | PACK | Freq: Every day | ORAL | Status: DC
  Administered 2024-03-07 – 2024-03-11 (×5): 17 g via ORAL
  Filled 2024-03-07 (×5): qty 1

## 2024-03-07 MED ORDER — ZINC SULFATE 220 (50 ZN) MG PO CAPS: 220.0000 mg | ORAL_CAPSULE | Freq: Every day | ORAL | Status: DC

## 2024-03-07 NOTE — Progress Notes (Signed)
 PHARMACIST - PHYSICIAN COMMUNICATION DR:   Murray Arnold CONCERNING: Antibiotic IV to Oral Route Change Policy  RECOMMENDATION: This patient is receiving metronidazole by the intravenous route.  Based on criteria approved by the Pharmacy and Therapeutics Committee, the antibiotic(s) is/are being converted to the equivalent oral dose form(s).   DESCRIPTION: These criteria include: Patient being treated for a respiratory tract infection, urinary tract infection, cellulitis  The patient is not neutropenic and does not exhibit a GI malabsorption state The patient is eating (either orally or via tube) and/or has been taking other orally administered medications for a least 24 hours The patient is improving clinically and has a Tmax < 100.5  If you have questions about this conversion, please contact the Pharmacy Department  []   [phone] )  [facility]ristine Done []   [phone] )  [facility] [x]   [phone] )  [facility] []   [phone] )  [facility] []   [phone] )  [facility]    Skeet Duke, PharmD, [place] Clinical Pharmacist Phone [phone]

## 2024-03-07 NOTE — Progress Notes (Signed)
 Progress Note     Subjective: Seen with nursing staff, significant pain with rolling. Still having some purulent drainage. Family and patient would still like to try to avoid OR if possible.   Objective: Vital signs in last 24 hours: Temp:  [98.1 F (36.7 C)-99.9 F (37.7 C)] 99.3 F (37.4 C) (04/17 1037) Pulse Rate:  [90-109] 106 (04/17 1037) Resp:  [17-24] 20 (04/17 1037) BP: (94-110)/(52-71) 94/52 (04/17 1037) SpO2:  [95 %-100 %] 95 % (04/17 1037) Last BM Date : 03/03/24  Intake/Output from previous day: 04/16 0701 - 04/17 0700 In: 1537.3 [I.V.:658.9; IV Piggyback:878.4] Out: 2550 [Urine:2550] Intake/Output this shift: No intake/output data recorded.  PE: General: WD, chronically ill appearing male who is laying in bed in NAD MSK: sacral wound with some healthy granulation tissue with some non-viable tissue around wound edges and superiorly, wound undermines ~6 cm cephalad and to the right about 3 o'clock where it undermines ~8 cm, thin layer tissue overlying bone but I do not think bone is necessarily exposed at this time   Lab Results:  Recent Labs    03/06/24 0501 03/07/24 0633  WBC 10.6* 11.1*  HGB 9.9* 9.4*  HCT 28.5* 27.3*  PLT 340 297   BMET Recent Labs    03/05/24 0446 03/06/24 0501  NA 135 136  K 4.2 4.1  CL 106 109  CO2 23 20*  GLUCOSE 416* 166*  BUN 18 16  CREATININE 0.82 0.87  CALCIUM 7.9* 7.8*   PT/INR Recent Labs    03/04/24 1706  LABPROT 19.7*  INR 1.6*   CMP     Component Value Date/Time   NA 136 03/06/2024 0501   NA 141 08/23/2023 1541   NA 140 10/25/2017 0753   K 4.1 03/06/2024 0501   K 4.2 10/25/2017 0753   CL 109 03/06/2024 0501   CL 101 10/23/2012 0802   CO2 20 (L) 03/06/2024 0501   CO2 21 (L) 10/25/2017 0753   GLUCOSE 166 (H) 03/06/2024 0501   GLUCOSE 160 (H) 10/25/2017 0753   GLUCOSE 149 (H) 10/23/2012 0802   BUN 16 03/06/2024 0501   BUN 23 08/23/2023 1541   BUN 13.1 10/25/2017 0753   CREATININE 0.87  03/06/2024 0501   CREATININE 1.00 10/31/2023 1601   CREATININE 1.1 10/25/2017 0753   CALCIUM 7.8 (L) 03/06/2024 0501   CALCIUM 9.1 10/25/2017 0753   PROT 4.7 (L) 03/05/2024 0446   PROT 6.0 (L) 10/25/2017 0753   PROT 5.8 (L) 10/25/2017 0752   ALBUMIN 1.9 (L) 03/05/2024 0446   ALBUMIN 3.5 10/25/2017 0753   AST 41 03/05/2024 0446   AST 20 12/22/2021 1013   AST 17 10/25/2017 0753   ALT 31 03/05/2024 0446   ALT 19 12/22/2021 1013   ALT 19 10/25/2017 0753   ALKPHOS 72 03/05/2024 0446   ALKPHOS 85 10/25/2017 0753   BILITOT 0.7 03/05/2024 0446   BILITOT 0.6 12/22/2021 1013   BILITOT 0.48 10/25/2017 0753   GFRNONAA >60 03/06/2024 0501   GFRNONAA >60 12/22/2021 1013   GFRNONAA 67 05/17/2021 1208   GFRAA 77 05/17/2021 1208   Lipase     Component Value Date/Time   LIPASE 40 01/16/2020 1119       Studies/Results: [place] EKG SITE RITE Result Date: 03/07/2024 If Site Rite image not attached, placement could not be confirmed due to current cardiac rhythm.   Anti-infectives: Anti-infectives (From admission, onward)    Start     Dose/Rate Route Frequency Ordered Stop  03/07/24 2200  metroNIDAZOLE (FLAGYL) tablet 500 mg        500 mg Oral Every 12 hours 03/07/24 0840 03/12/24 0959   03/05/24 1930  vancomycin (VANCOREADY) IVPB 1250 mg/250 mL  Status:  Discontinued        1,250 mg 166.7 mL/hr over 90 Minutes Intravenous Every 24 hours 03/04/24 2205 03/04/24 2224   03/05/24 1930  Vancomycin (VANCOCIN) 1,250 mg in sodium chloride 0.9 % 250 mL IVPB  Status:  Discontinued        1,250 mg 166.7 mL/hr over 90 Minutes Intravenous Every 24 hours 03/04/24 2224 03/05/24 0717   03/05/24 1930  vancomycin (VANCOREADY) IVPB 1500 mg/300 mL        1,500 mg 150 mL/hr over 120 Minutes Intravenous Every 24 hours 03/05/24 0717     03/05/24 1400  ceFEPIme (MAXIPIME) 2 g in sodium chloride 0.9 % 100 mL IVPB        2 g 200 mL/hr over 30 Minutes Intravenous Every 8 hours 03/05/24 0717     03/05/24 0600   ceFEPIme (MAXIPIME) 2 g in sodium chloride 0.9 % 100 mL IVPB  Status:  Discontinued        2 g 200 mL/hr over 30 Minutes Intravenous Every 12 hours 03/04/24 2205 03/05/24 0717   03/05/24 0000  metroNIDAZOLE (FLAGYL) IVPB 500 mg  Status:  Discontinued        500 mg 100 mL/hr over 60 Minutes Intravenous Every 12 hours 03/04/24 2206 03/04/24 2209   03/04/24 1730  aztreonam (AZACTAM) 2 g in sodium chloride 0.9 % 100 mL IVPB  Status:  Discontinued        2 g 200 mL/hr over 30 Minutes Intravenous  Once 03/04/24 1715 03/04/24 1724   03/04/24 1730  metroNIDAZOLE (FLAGYL) IVPB 500 mg  Status:  Discontinued        500 mg 100 mL/hr over 60 Minutes Intravenous Every 12 hours 03/04/24 1715 03/07/24 0840   03/04/24 1730  vancomycin (VANCOREADY) IVPB 1500 mg/300 mL        1,500 mg 150 mL/hr over 120 Minutes Intravenous  Once 03/04/24 1715 03/04/24 2153   03/04/24 1730  ceFEPIme (MAXIPIME) 2 g in sodium chloride 0.9 % 100 mL IVPB        2 g 200 mL/hr over 30 Minutes Intravenous  Once 03/04/24 1724 03/04/24 1826        Assessment/Plan  Sacral decubitus ulcer, stage 4 - was able to excise necrotic eschar 4/15 overlying wound today and found wound extends 6 cm cephalad and 8 cm to the right at 3 o'clock, purulent drainage present - continue BID WTD dressing changes with Dakin's  - offload pressure as able  - leukocytosis stable - continue to hold eliquis for now and will re-evaluate Monday. If worsening then will consider operative debridement. General surgery will not see over the weekend but are available if acute issues/concerns arise.   FEN: reg diet, IVF per TRH  VTE: heparin gtt ID: [id number]  - per TR[facility] -  Recent COVID Deconditioned  Paroxysmal A. Fib on eliquis Bradycardia T2DM HTN HLD Hx of CVA with residual R sided weakness GERD BPH Thyroid disease Amyloid cardiomyopathy HFrEF Chronic pancytopenia   LOS: 3 days   I reviewed Consultant  cardiology notes,  hospitalist notes, last 24 h vitals and pain scores, last 48 h intake and output, last 24 h labs and trends, and last 24 h imaging results.  This care required high  level of medical  decision making.    Annetta Killian, [facility] Surgery 03/07/2024, 4:02 PM Please see Amion for pager number during day hours 7:00am-4:30pm

## 2024-03-07 NOTE — Inpatient Diabetes Management (Signed)
 Inpatient Diabetes Program Recommendations  AACE/ADA: New Consensus Statement on Inpatient Glycemic Control   Target Ranges:  Prepandial:   less than 140 mg/dL      Peak postprandial:   less than 180 mg/dL (1-2 hours)      Critically ill patients:  140 - 180 mg/dL    Latest Reference Range & Units 03/06/24 07:41 03/06/24 12:09 03/06/24 16:10 03/06/24 21:02 03/07/24 08:33  Glucose-Capillary 70 - 99 mg/dL 846 (H) 962 (H) 952 (H) 314 (H) 236 (H)   Review of Glycemic Control  Diabetes history: DM2 Outpatient Diabetes medications: Metformin 1000 mg BID Current orders for Inpatient glycemic control: Semglee 10 units daily, Novolog 0-15 units TID with meals, Novolog 0-5 units QHS   Inpatient Diabetes Program Recommendations:    Insulin: Please consider increasing Semglee to 15 units daily and Novolog 3 units TID with meals for meal coverage if patient eats at least 50% of meals.   Thanks, Beacher Limerick, RN, MSN, CDCES Diabetes Coordinator Inpatient Diabetes Program [phone] (Team Pager from 8am to 5pm)

## 2024-03-07 NOTE — Progress Notes (Signed)
 At bedside to place PICC accessed vein with ease and introducer inserted without difficulty but was unable to get picc to pass centrally. Recommendations would be to have IR place line .

## 2024-03-07 NOTE — Progress Notes (Signed)
 PROGRESS NOTE  Earl [name]  ZOX:[id number] DOB: 09-Jun-1941 DOA: 03/04/2024 PCP: [name], MD   Brief Narrative: Patient is a 83 year old male with history of paroxysmal A-fib, insulin-dependent diabetes, hypertension, hyperlipidemia, CVA with residual right-sided weakness, MGUS, GERD, BPH, hypothyroidism, HFrEF with EF of 40 to 45%, chronic pancytopenia who presented to the emergency department from SNF with complaint of shortness of breath, wound, pain in the sacral area.  He was just discharged from here to [facility] rehab 4 days ago.  Hospital course was remarkable for acute metabolic encephalopathy, hypotension, hypoglycemia.  On presentation, he was hypotensive, EKG showed A-fib with RVR, tachypnea but saturating fine on room air.  Respiratory panel negative for COVID.  PCCM consulted for persistent hypotension.  Cardiology also consulted.  General surgery consulted for sacral ulcer.  Started on broad-spectrum antibiotics, amiodarone drip.  Status post bedside excisional debridement of the sacral wound on 4/15.  Wound care, cardiology, general surgery following.  Assessment & Plan:  Principal Problem:   Severe sepsis (HCC) Active Problems:   Paroxysmal atrial fibrillation (HCC)   Sepsis (HCC)   Atrial flutter (HCC)   Sacral ulcer (HCC)   MGUS (monoclonal gammopathy of unknown significance)   Essential hypertension   Hyperlipidemia   Insulin dependent type 2 diabetes mellitus (HCC)   History of CVA (cerebrovascular accident)   Cardiac amyloidosis (HCC)   Chronic systolic CHF (congestive heart failure) (HCC)   GERD (gastroesophageal reflux disease)   History of hypertension   Atrial fibrillation with RVR (HCC)  Sepsis secondary to sacral pressure wound/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  Hypotensive, encephalopathic, hypoglycemic, elevated lactate,  leukocytosis on presentation.  Started on IV fluid, blood spectrum antibiotics.  Follow-up cultures:NGTD Code sepsis was activated.  PCCM recommended to continue midodrine. Blood pressure better with systolic in the range of 100s.  Currently he is alert and mostly oriented.  Chronic sacral wound with suspicion of infection: CT chest/abdomen/pelvis showed sacral decubitus wound with extension to the right gluteus muscle, no definitive bony lesions,no signs of osteomyelitis.  General surgery consulted,   Continue current antibiotics.  PT consulted for hydrotherapy.  General surgery planning to reconsider debridement today.  Continue zinc, vitamin C  History of paroxysmal A-fib: Presented with A-fib with RVR.  Toprol was discontinued on last admission.  Started on amiodarone drip.  Cardiology following.  Eliquis on hold for possible need of debridement, on heparin drip.  Continue digoxin  History of HFrEF/transthyretin cardiac amyloidosis/nonischemic cardiomyopathy/hypertension: Presented with hypotension.  Hanley Ben, Toprol at home,. Currently on hold.  Given IV fluids initially.    Cardiology team following.  Insulin-dependent diabetes type 2: Presented with hyperglycemia.  Recent A1c in the range of 8.  Diabetic coordinator consulted.  Currently on sliding scale.  Hyperglycemic.  Started on Lantus 15 units, sliding scale, meal coverage  History of CVA: Has a right-sided residual weakness.  On Eliquis, Lipitor  GERD: Continue Protonix  Anemia of chronic disease: Continue iron supplementation  Hypothyroidism: Continue levothyroxine  Recent history of COVID: COVID was positive on 02/14/2024.  Still positive.  Out of 21 days window, do not think he needs contact/airborne precaution  Severe protein calorie malnutrition/suspicion for dysphagia/hypophosphatemia: Speech therapy consulted.  Phosphorus supplemented.  Dietitian consulted.    Nutrition Problem: Inadequate oral intake Etiology:  decreased appetite Pressure Injury 02/26/24 Coccyx Mid Unstageable - Full thickness tissue loss in which the base of the injury is  covered by slough (yellow, tan, gray, green or brown) and/or eschar (tan, brown or black) in the wound bed. (Active)  02/26/24 0916  Location: Coccyx  Location Orientation: Mid  Staging: Unstageable - Full thickness tissue loss in which the base of the injury is covered by slough (yellow, tan, gray, green or brown) and/or eschar (tan, brown or black) in the wound bed.  Wound Description (Comments):   Present on Admission: No  Dressing Type Foam - Lift dressing to assess site every shift 03/06/24 2100     Pressure Injury 02/26/24 Toe (Comment  which one) Anterior;Right Deep Tissue Pressure Injury - Purple or maroon localized area of discolored intact skin or blood-filled blister due to damage of underlying soft tissue from pressure and/or shear. right gre (Active)  02/26/24 1215  Location: Toe (Comment  which one)  Location Orientation: Anterior;Right  Staging: Deep Tissue Pressure Injury - Purple or maroon localized area of discolored intact skin or blood-filled blister due to damage of underlying soft tissue from pressure and/or shear.  Wound Description (Comments): right great toe  Present on Admission:   Dressing Type Foam - Lift dressing to assess site every shift 03/06/24 2100     Pressure Injury 03/06/24 Hip Anterior;Left;Proximal Deep Tissue Pressure Injury - Purple or maroon localized area of discolored intact skin or blood-filled blister due to damage of underlying soft tissue from pressure and/or shear. Fluid filled blister w (Active)  03/06/24 1612  Location: Hip  Location Orientation: Anterior;Left;Proximal  Staging: Deep Tissue Pressure Injury - Purple or maroon localized area of discolored intact skin or blood-filled blister due to damage of underlying soft tissue from pressure and/or shear.  Wound Description (Comments): Fluid filled blister with  purple discoloration  Present on Admission: No  Dressing Type Foam - Lift dressing to assess site every shift 03/06/24 2100     Pressure Injury 03/06/24 Shoulder Left Deep Tissue Pressure Injury - Purple or maroon localized area of discolored intact skin or blood-filled blister due to damage of underlying soft tissue from pressure and/or shear. dark purple discoloration to should (Active)  03/06/24 1613  Location: Shoulder  Location Orientation: Left  Staging: Deep Tissue Pressure Injury - Purple or maroon localized area of discolored intact skin or blood-filled blister due to damage of underlying soft tissue from pressure and/or shear.  Wound Description (Comments): dark purple discoloration to shoulder  Present on Admission: No  Dressing Type Foam - Lift dressing to assess site every shift 03/06/24 2100    DVT prophylaxis:SCDs Start: 03/04/24 2151 Place TED hose Start: 03/04/24 2151     Code Status: Limited: Do not attempt resuscitation (DNR) -DNR-LIMITED -Do Not Intubate/DNI   Family Communication: Daughter at bedside on 4/17  Patient status:Inpatient  Patient is from :home  Anticipated discharge to:not sure  Estimated DC date:not sure   Consultants:Cardiology,pccm   Procedures:None yet  Antimicrobials:  Anti-infectives (From admission, onward)    Start     Dose/Rate Route Frequency Ordered Stop   03/07/24 2200  metroNIDAZOLE (FLAGYL) tablet 500 mg        500 mg Oral Every 12 hours 03/07/24 0840 03/12/24 0959   03/05/24 1930  vancomycin (VANCOREADY) IVPB 1250 mg/250 mL  Status:  Discontinued        1,250 mg 166.7 mL/hr over 90 Minutes Intravenous Every 24 hours 03/04/24 2205 03/04/24 2224   03/05/24 1930  Vancomycin (VANCOCIN) 1,250 mg in sodium chloride 0.9 % 250 mL IVPB  Status:  Discontinued  1,250 mg 166.7 mL/hr over 90 Minutes Intravenous Every 24 hours 03/04/24 2224 03/05/24 0717   03/05/24 1930  vancomycin (VANCOREADY) IVPB 1500 mg/300 mL        1,500  mg 150 mL/hr over 120 Minutes Intravenous Every 24 hours 03/05/24 0717     03/05/24 1400  ceFEPIme (MAXIPIME) 2 g in sodium chloride 0.9 % 100 mL IVPB        2 g 200 mL/hr over 30 Minutes Intravenous Every 8 hours 03/05/24 0717     03/05/24 0600  ceFEPIme (MAXIPIME) 2 g in sodium chloride 0.9 % 100 mL IVPB  Status:  Discontinued        2 g 200 mL/hr over 30 Minutes Intravenous Every 12 hours 03/04/24 2205 03/05/24 0717   03/05/24 0000  metroNIDAZOLE (FLAGYL) IVPB 500 mg  Status:  Discontinued        500 mg 100 mL/hr over 60 Minutes Intravenous Every 12 hours 03/04/24 2206 03/04/24 2209   03/04/24 1730  aztreonam (AZACTAM) 2 g in sodium chloride 0.9 % 100 mL IVPB  Status:  Discontinued        2 g 200 mL/hr over 30 Minutes Intravenous  Once 03/04/24 1715 03/04/24 1724   03/04/24 1730  metroNIDAZOLE (FLAGYL) IVPB 500 mg  Status:  Discontinued        500 mg 100 mL/hr over 60 Minutes Intravenous Every 12 hours 03/04/24 1715 03/07/24 0840   03/04/24 1730  vancomycin (VANCOREADY) IVPB 1500 mg/300 mL        1,500 mg 150 mL/hr over 120 Minutes Intravenous  Once 03/04/24 1715 03/04/24 2153   03/04/24 1730  ceFEPIme (MAXIPIME) 2 g in sodium chloride 0.9 % 100 mL IVPB        2 g 200 mL/hr over 30 Minutes Intravenous  Once 03/04/24 1724 03/04/24 1826       Subjective: Patient seen and examined at the bedside today.  Heart rate better than before but still more than 100.  Systolic blood pressure in the range of 100 100s.  He appears overall comfortable but very deconditioned and weak, lying on bed.  Alert and oriented.  Daughter at bedside.  Has poor oral intake but eats more than assisted  Objective: Vitals:   03/06/24 2055 03/06/24 2346 03/07/24 0440 03/07/24 0512  BP: (!) 105/54 110/65 98/71   Pulse: 100 (!) 109 90   Resp: 17 20 17  (!) 24  Temp: 99.9 F (37.7 C) 98.1 F (36.7 C) 98.6 F (37 C)   TempSrc: Oral Oral Oral   SpO2: 99% 100% 95%   Weight:      Height:         Intake/Output Summary (Last 24 hours) at 03/07/2024 1123 Last data filed at 03/07/2024 0540 Gross per 24 hour  Intake 1537.28 ml  Output 1650 ml  Net -112.72 ml   Filed Weights   03/04/24 1656  Weight: 68.5 kg    Examination:   General exam: Weak and deconditioned, lying in bed, chronically ill looking HEENT: PERRL Respiratory system:  no wheezes or crackles, diminished sounds on bases Cardiovascular system: Irregularly irregular rhythm Gastrointestinal system: Abdomen is nondistended, soft and nontender. Central nervous system: Alert and oriented Extremities: No edema, no clubbing ,no cyanosis Skin: Scattered pressure ulcers, sacral pressure ulcer   Data Reviewed: I have personally reviewed following labs and imaging studies  CBC: Recent Labs  Lab 03/04/24 1706 03/05/24 0446 03/06/24 0501 03/07/24 0633  WBC 13.3* 10.7* 10.6* 11.1*  NEUTROABS 10.8*  --   --   --  HGB 10.8* 9.0* 9.9* 9.4*  HCT 30.8* 25.3* 28.5* 27.3*  MCV 84.2 84.1 84.1 84.3  PLT 378 270 340 297   Basic Metabolic Panel: Recent Labs  Lab 03/04/24 1706 03/05/24 0446 03/05/24 1412 03/06/24 0501 03/07/24 0633  NA 130* 135  --  136  --   K 5.0 4.2  --  4.1  --   CL 100 106  --  109  --   CO2 21* 23  --  20*  --   GLUCOSE 500* 416*  --  166*  --   BUN 28* 18  --  16  --   CREATININE 1.02 0.82  --  0.87  --   CALCIUM 7.8* 7.9*  --  7.8*  --   MG  --   --  1.7 1.7  --   PHOS  --   --   --  2.1* 2.4*     Recent Results (from the past 240 hours)  Resp panel by RT-PCR (RSV, Flu A&B, Covid) Anterior Nasal Swab     Status: Abnormal   Collection Time: 03/04/24  5:38 PM   Specimen: Anterior Nasal Swab  Result Value Ref Range Status   SARS Coronavirus 2 by RT PCR POSITIVE (A) NEGATIVE Final   Influenza A by PCR NEGATIVE NEGATIVE Final   Influenza B by PCR NEGATIVE NEGATIVE Final    Comment: (NOTE) The Xpert Xpress SARS-CoV-2/FLU/RSV plus assay is intended as an aid in the diagnosis of  influenza from Nasopharyngeal swab specimens and should not be used as a sole basis for treatment. Nasal washings and aspirates are unacceptable for Xpert Xpress SARS-CoV-2/FLU/RSV testing.  Fact Sheet for Patients: BloggerCourse.com  Fact Sheet for Healthcare Providers: SeriousBroker.it  This test is not yet approved or cleared by the [place]  FDA and has been authorized for detection and/or diagnosis of SARS-CoV-2 by FDA under an Emergency Use Authorization (EUA). This EUA will remain in effect (meaning this test can be used) for the duration of the COVID-19 declaration under Section 564(b)(1) of the Act, 21 U.S.C. section 360bbb-3(b)(1), unless the authorization is terminated or revoked.     Resp Syncytial Virus by PCR NEGATIVE NEGATIVE Final    Comment: (NOTE) Fact Sheet for Patients: BloggerCourse.com  Fact Sheet for Healthcare Providers: SeriousBroker.it  This test is not yet approved or cleared by the [place]  FDA and has been authorized for detection and/or diagnosis of SARS-CoV-2 by FDA under an Emergency Use Authorization (EUA). This EUA will remain in effect (meaning this test can be used) for the duration of the COVID-19 declaration under Section 564(b)(1) of the Act, 21 U.S.C. section 360bbb-3(b)(1), unless the authorization is terminated or revoked.  Performed at [facility], [address] [place], [place] [address]   Culture, blood (x 2)     Status: None (Preliminary result)   Collection Time: 03/04/24  5:44 PM   Specimen: BLOOD LEFT HAND  Result Value Ref Range Status   Specimen Description BLOOD LEFT HAND  Final   Special Requests   Final    BOTTLES DRAWN AEROBIC ONLY Blood Culture adequate volume   Culture   Final    NO GROWTH 3 DAYS Performed at [facility], [address], [place], [place] [address]    Report Status PENDING   Incomplete  Culture, blood (x 2)     Status: None (Preliminary result)   Collection Time: 03/04/24  6:14 PM   Specimen: BLOOD RIGHT HAND  Result  Value Ref Range Status   Specimen Description BLOOD RIGHT HAND  Final   Special Requests   Final    BOTTLES DRAWN AEROBIC ONLY Blood Culture results may not be optimal due to an inadequate volume of blood received in culture bottles   Culture   Final    NO GROWTH 3 DAYS Performed at [facility], [name], [place], [place] [address]    Report Status PENDING  Incomplete     Radiology Studies: US  EKG SITE RITE Result Date: 03/07/2024 If Site Rite image not attached, placement could not be confirmed due to current cardiac rhythm.   Scheduled Meds:  ascorbic acid  500 mg Oral BID   atorvastatin  40 mg Oral Daily   Chlorhexidine Gluconate Cloth  6 each Topical Daily   [START ON 03/10/2024] collagenase   Topical Daily   digoxin  0.125 mg Oral Daily   feeding supplement (GLUCERNA SHAKE)  237 mL Oral TID BM   ferrous sulfate  325 mg Oral QHS   insulin aspart  0-15 Units Subcutaneous TID WC   insulin aspart  0-5 Units Subcutaneous QHS   insulin glargine-yfgn  10 Units Subcutaneous Daily   leptospermum manuka honey  1 Application Topical Daily   levothyroxine  50 mcg Oral Q0600   liver oil-zinc oxide   Topical BID   metroNIDAZOLE  500 mg Oral Q12H   midodrine  5 mg Oral TID WC   phosphorus  500 mg Oral TID   polyethylene glycol  17 g Oral Daily   senna-docusate  1 tablet Oral BID   sodium chloride flush  3 mL Intravenous Q12H   sodium hypochlorite   Irrigation BID   Tafamidis  1 capsule Oral Daily   zinc sulfate (50mg  elemental zinc)  220 mg Oral Daily   Continuous Infusions:  amiodarone 30 mg/hr (03/07/24 0607)   ceFEPime (MAXIPIME) IV 2 g (03/07/24 0600)   heparin 1,100 Units/hr (03/07/24 0540)   vancomycin Stopped (03/07/24 0154)     LOS: 3 days   Leona Rake, MD [facility] P4/17/2025, 11:23 AM

## 2024-03-07 NOTE — Progress Notes (Signed)
 Patient is from [facility] short term. TOC awaiting PT/OT evals.TOC will continue to follow and assist with patients DC planning needs.

## 2024-03-07 NOTE — Consult Note (Signed)
 WOC Nurse Consult Note: this patient is well known to WOC team from previous admission and sacral wound (see prior consult note 03/05/2024), surgery has evaluated sacral wound and receiving PT hydro as well as daily wound care changes; consulted for additional pressure injuries 03/07/2024 Reason for Consult: multiple pressure injuries  Wound type: 1. Full thickness L elbow likely r/t friction  2.  Stage 2 PI L hip/trochanter (serum filled blister)  3.  L shoulder Deep Tissue Pressure Injury  4.  R heel Deep Tissue Pressure Injury; L distal great toe DTPI  5.  Serum filled blister L facial cheek  Pressure Injury POA: no  Measurement:see nursing flowsheet  Wound bed: 1.  L elbow 75% pink 25% yellow  2.  L hip/trochanter intact serum filled blister; L cheek with intact serum filled blister  3.  L shoulder purple maroon discoloration with lifting of dermis  4,  L great toe and R heel purple maroon discoloration  Drainage (amount, consistency, odor) appear dry other than L elbow wound  Periwound: intact  Dressing procedure/placement/frequency:  Cleanse L elbow full thickness wound with NS, apply Medihoney to wound bed daily, cover with dry gauze and silicone foam.  Apply Xeroform gauze [employer] [phone]) to L hip and L shoulder wounds daily, cover with silicone foam or ABD pad and tape whichever is preferred.  Cleanse R heel and L great toe with soap and water, dry and apply Xeroform gauze to wound beds daily.  May cover Xeroform with silicone foam L great toe, cover R heel Xeroform with dry gauze and Kerlix roll gauze.  B feet should be placed in Prevalon boots to offload pressure [employer] Foot [phone]) May cover L cheek wound with small silicone foam to protect area, lift foam daily to assess.    POC discussed with bedside nurse. WOC team will follow every 7 to 10 days to assess areas and change POC as needed.   Thank you,    Ronni Colace MSN, RN-BC, [employer] [phone]

## 2024-03-07 NOTE — Progress Notes (Signed)
 Initial Nutrition Assessment  DOCUMENTATION CODES:   Not applicable  INTERVENTION:   - Magic Cup TID with meals, each supplement provides 290 kcal and 9 grams of protein  - Mighty Shake TID with meals, each supplement provides 330 kcals and 9 grams of protein  - Glucerna Shake po TID, each supplement provides 220 kcal and 10 grams of protein  - Zinc sulfate 220 mg daily x 14 days to support wound healing  - Vitamin C 500 mg BID x 30 days to support wound healing  - Awaiting results of vitamin A lab  NUTRITION DIAGNOSIS:   Inadequate oral intake related to decreased appetite as evidenced by per patient/family report.  GOAL:   Patient will meet greater than or equal to 90% of their needs  MONITOR:   PO intake, Supplement acceptance, Labs, Weight trends, Skin  REASON FOR ASSESSMENT:   Consult Assessment of nutrition requirement/status  ASSESSMENT:   83 year old male who presented to the ED on 03/04/24 from [facility] due to sacral pain. PMH of T2DM, HTN, HLD, CHF, atrial fibrillation, stroke with right-sided weakness, GERD, BPH, hypothyroidism, MGUS, transthyretin associated amyloid cardiomyopathy, chronic pancytopenia, recent hospital admission for COVID-19. Pt admitted with sepsis secondary to sacral pressure injury infection.  4/15 - s/p bedside wound debridement, initiation of hydrotherapy  RD working remotely. Spoke with pt's daughter via phone call to room. Daughter states that pt's appetite has decreased over the last month due to illness and now infection. Daughter reports pt is doing better with liquids than he is with solid foods at this time. Per daughter, pt has been chewing food for a long period of time but not swallowing it. SLP is following. Daughter states that pt ate 1 bite of breakfast this morning. Daughter is requesting any oral nutrition supplements available, especially Magic Cup and [employer] which pt has previously consumed. Daughter also  requesting Glucerna shakes as pt drank these during previous admission. Pt prefers vanilla flavor in all oral nutrition supplements.  Daughter notes that pt's current weight is likely falsely elevated due to fluid. She states that pt is very puffy. Per nursing edema assessment, pt with mild pitting generalized edema, moderate pitting edema to RUE, mild pitting edema to LUE, and moderate pitting edema to BLE. Daughter notes that pt has lost some muscle mass over the last few weeks. Pt met criteria for moderate malnutrition during recent previous admission; suspect malnutrition persists but unable to confirm without NFPE.  Reviewed weight history in chart. Weight on admission appears to be stated rather than measured. If accurate, pt with a 5 kg weight loss from 11/23/23 to 03/04/24. This is a 6.8% weight loss in less than 4 months which is not clinically significant for timeframe but is concerning given history of malnutrition.  Reached out to MD regarding ordering zinc sulfate and vitamin C to support wound healing. MD in agreement. Noted vitamin A level checked and is pending at this time. If vitamin A deficiency is present, recommend supplementation as this would also contribute to poor wound healing.  Daughter reports issues with ordering foods pt likes on current diet order. Will liberalize to Regular to allow for maximum food options at meals. Pt not eating enough at meals for this liberalization to have any meaningful impact on his blood sugar. Noted Diabetes Coordinator recommending increase in semglee.  Medications reviewed and include: ferrous sulfate 325 mg daily, SSI TID with meals, SSI daily at bedtime, semglee 10 units daily, levothyroxine, K Phos Neutral 500  mg TID, amiodarone gtt, IV abx, heparin gtt  Labs reviewed: phosphorus 2.4, WBC 11.1, hemoglobin 9.4  UOP: 2550 mL x 24 hours I/O's: -1.0 L since admit  NUTRITION - FOCUSED PHYSICAL EXAM:  Unable to complete. RD working  remotely.  Diet Order:   Diet Order             Diet regular Room service appropriate? Yes; Fluid consistency: Thin  Diet effective now                   EDUCATION NEEDS:   Education needs have been addressed  Skin:  Skin Assessment:  Skin Integrity Issues: DTI: L hip, L shoulder, R great toe Unstageable: mid coccyx Other: skin tear L elbow, MASD bilateral sacrum  Last BM:  03/03/24  Height:   Ht Readings from Last 1 Encounters:  03/04/24 5\' 11"  (1.803 m)    Weight:   Wt Readings from Last 1 Encounters:  03/04/24 68.5 kg    BMI:  Body mass index is 21.06 kg/m.  Estimated Nutritional Needs:   Kcal:  2000-2200  Protein:  95-100 grams  Fluid:  >2.0 L    Ernestina Headland, MS, RD, LDN Registered Dietitian II Please see AMiON for contact information.

## 2024-03-07 NOTE — Plan of Care (Signed)
  Problem: Fluid Volume: Goal: Hemodynamic stability will improve Outcome: Progressing   Problem: Clinical Measurements: Goal: Diagnostic test results will improve Outcome: Progressing Goal: Signs and symptoms of infection will decrease Outcome: Progressing   Problem: Respiratory: Goal: Ability to maintain adequate ventilation will improve Outcome: Progressing   Problem: Respiratory: Goal: Will maintain a patent airway Outcome: Progressing Goal: Complications related to the disease process, condition or treatment will be avoided or minimized Outcome: Progressing   Problem: Metabolic: Goal: Ability to maintain appropriate glucose levels will improve Outcome: Progressing   Problem: Clinical Measurements: Goal: Ability to maintain clinical measurements within normal limits will improve Outcome: Progressing Goal: Will remain free from infection Outcome: Progressing Goal: Diagnostic test results will improve Outcome: Progressing Goal: Respiratory complications will improve Outcome: Progressing Goal: Cardiovascular complication will be avoided Outcome: Progressing   Problem: Safety: Goal: Ability to remain free from injury will improve Outcome: Progressing   Problem: Pain Managment: Goal: General experience of comfort will improve and/or be controlled Outcome: Progressing

## 2024-03-07 NOTE — Progress Notes (Signed)
   Patient Name: Earl [name] Date of Encounter: 03/07/2024 [facility] Cardiologist: Richardo Chandler, MD    Interval Summary  .    Swallow study completed IV access achieved BP has improved. Being evaluated for potential debridement today This is the most we have been able to engage this admission.  Vital Signs .    Vitals:   03/06/24 2055 03/06/24 2346 03/07/24 0440 03/07/24 0512  BP: (!) 105/54 110/65 98/71   Pulse: 100 (!) 109 90   Resp: 17 20 17  (!) 24  Temp: 99.9 F (37.7 C) 98.1 F (36.7 C) 98.6 F (37 C)   TempSrc: Oral Oral Oral   SpO2: 99% 100% 95%   Weight:      Height:        Intake/Output Summary (Last 24 hours) at 03/07/2024 0820 Last data filed at 03/07/2024 0540 Gross per 24 hour  Intake 1537.28 ml  Output 2550 ml  Net -1012.72 ml      03/04/2024    4:56 PM 02/14/2024    7:52 PM 01/24/2024   11:05 AM  Last 3 Weights  Weight (lbs) 151 lb 159 lb 159 lb  Weight (kg) 68.493 kg 72.122 kg 72.122 kg      Telemetry/ECG    AF rates ~ 110  Physical Exam .    GEN: Chronically ill appearing.  Neck: No JVD Cardiac: Irregular rate and rhythm, no murmurs, rubs, or gallops.  Respiratory: Clear to auscultation bilaterally. Normal WOB on room air  GI: Soft, nontender, non-distended  MS: Bilateral hands and feet are mildly swollen, stable  Assessment & Plan .     Atrial Fib/flutter with RVR  - rates are better controlled - Resuming OAC when cleared by surgery - planned for IV amiodarone and IV heparin; if no surgery switch to OAC today; PO Amiodarone tomorrow  Chronic HFrEF Cardiac Amyloidosis  Non-ischemic cardiomyopathy  - Patient hypotensive- home entresto, metoprolol held - Continue Tafamadis for TRR amyloid- note this is not on formulary, family brought home medication. 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  - Suspect some of this is dilutional as he has received >3L of IV fluids so far.  - stable hemoglobin  Otherwise per primary  - sepsis, sacral ulcer  - Type 2 DM  - Hyperthyroidism  - GERD  - History of CVA   For questions or updates, please contact [facility] Please consult [email or web address] for contact info under    Gloriann Larger, MD FASE [facility] Cardiologist [facility]  [address] [place], [address] [place], [place] [address] [phone]  8:20 AM

## 2024-03-07 NOTE — Progress Notes (Signed)
 Pharmacy Antibiotic Note  Earl [name] is a 83 y.o. male admitted on 03/04/2024 with sepsis from sacral wounds.  Pharmacy has been consulted for vancomycin and cefepime dosing.   -WBC 11.1, afebrile, CrC ~ 60 -cultures - ngtd -possible plans for debridement  Plan: Continue Cefepime 2g q8h  Continue Vancomycin 1500mg  q24h, eAUC: 462. Follow culture data for de-escalation.  Monitor renal function for dose adjustments as indicated.   Height: 5\' 11"  (180.3 cm) Weight: 68.5 kg (151 lb) IBW/kg (Calculated) : 75.3  Temp (24hrs), Avg:98.6 F (37 C), Min:97.9 F (36.6 C), Max:99.9 F (37.7 C)  Recent Labs  Lab 03/04/24 1706 03/04/24 1744 03/04/24 2004 03/05/24 0446 03/05/24 1412 03/06/24 0501 03/07/24 0633  WBC 13.3*  --   --  10.7*  --  10.6* 11.1*  CREATININE 1.02  --   --  0.82  --  0.87  --   LATICACIDVEN  --  2.8* 2.3* 1.5 2.0*  --   --     Estimated Creatinine Clearance: 63.4 mL/min (by C-G formula based on SCr of 0.87 mg/dL).    Allergies  Allergen Reactions   Penicillins Other (See Comments)    Did it involve swelling of the face/tongue/throat, SOB, or low BP? Unknown Did it involve sudden or severe rash/hives, skin peeling, or any reaction on the inside of your mouth or nose? Unknown Did you need to seek medical attention at a hospital or doctor's office? No When did it last happen?    Over 42 Years Ago   If all above answers are "NO", may proceed with cephalosporin use.      Antimicrobials this admission: Cefepime 4/15 >>  Vancomycin 4/15 >>  Flagyl 4/14>>  Microbiology results: 4/14 BCx: ngtd   Thank you for allowing pharmacy to be a part of this patient's care.  Baxter Limber, PharmD Clinical Pharmacist **Pharmacist phone directory can now be found on [email or web address] (PW [email or web address]).  Listed under [facility].

## 2024-03-08 ENCOUNTER — Encounter: Admitting: Family

## 2024-03-08 DIAGNOSIS — R6520 Severe sepsis without septic shock: Secondary | ICD-10-CM | POA: Diagnosis not present

## 2024-03-08 DIAGNOSIS — A419 Sepsis, unspecified organism: Secondary | ICD-10-CM | POA: Diagnosis not present

## 2024-03-08 LAB — APTT: aPTT: 65 s — ABNORMAL HIGH (ref 24–36)

## 2024-03-08 LAB — BASIC METABOLIC PANEL WITH GFR
Anion gap: 12 (ref 5–15)
BUN: 16 mg/dL (ref 8–23)
CO2: 21 mmol/L — ABNORMAL LOW (ref 22–32)
Calcium: 7.4 mg/dL — ABNORMAL LOW (ref 8.9–10.3)
Chloride: 101 mmol/L (ref 98–111)
Creatinine, Ser: 0.86 mg/dL (ref 0.61–1.24)
GFR, Estimated: >60 mL/min (ref >60)
Glucose, Bld: 160 mg/dL — ABNORMAL HIGH (ref 70–99)
Potassium: 3.7 mmol/L (ref 3.5–5.1)
Sodium: 134 mmol/L — ABNORMAL LOW (ref 135–145)

## 2024-03-08 LAB — GLUCOSE, CAPILLARY
Glucose-Capillary: 189 mg/dL — ABNORMAL HIGH (ref 70–99)
Glucose-Capillary: 300 mg/dL — ABNORMAL HIGH (ref 70–99)
Glucose-Capillary: 230 mg/dL — ABNORMAL HIGH (ref 70–99)
Glucose-Capillary: 237 mg/dL — ABNORMAL HIGH (ref 70–99)

## 2024-03-08 LAB — CBC
HCT: 27.4 % — ABNORMAL LOW (ref 39.0–52.0)
Hemoglobin: 9.5 g/dL — ABNORMAL LOW (ref 13.0–17.0)
MCH: 29.1 pg (ref 26.0–34.0)
MCHC: 34.7 g/dL (ref 30.0–36.0)
MCV: 83.8 fL (ref 80.0–100.0)
Platelets: 302 10*3/uL (ref 150–400)
RBC: 3.27 MIL/uL — ABNORMAL LOW (ref 4.22–5.81)
RDW: 13.8 % (ref 11.5–15.5)
WBC: 12.5 10*3/uL — ABNORMAL HIGH (ref 4.0–10.5)
nRBC: 0 % (ref 0.0–0.2)

## 2024-03-08 LAB — HEPARIN LEVEL (UNFRACTIONATED): Heparin Unfractionated: 0.45 [IU]/mL (ref 0.30–0.70)

## 2024-03-08 LAB — VANCOMYCIN, TROUGH: Vancomycin Tr: 12 ug/mL — ABNORMAL LOW (ref 15–20)

## 2024-03-08 LAB — PHOSPHORUS: Phosphorus: 2.1 mg/dL — ABNORMAL LOW (ref 2.5–4.6)

## 2024-03-08 MED ORDER — INSULIN GLARGINE-YFGN 100 UNIT/ML ~~LOC~~ SOLN
12.0000 [IU] | Freq: Every day | SUBCUTANEOUS | Status: DC
  Administered 2024-03-09: 12 [IU] via SUBCUTANEOUS
  Filled 2024-03-08 (×2): qty 0.12

## 2024-03-08 MED ORDER — AMIODARONE HCL 200 MG PO TABS
400.0000 mg | ORAL_TABLET | Freq: Two times a day (BID) | ORAL | Status: DC
  Administered 2024-03-08 – 2024-03-09 (×3): 400 mg via ORAL
  Filled 2024-03-08 (×3): qty 2

## 2024-03-08 MED ORDER — INSULIN ASPART 100 UNIT/ML IJ SOLN
3.0000 [IU] | Freq: Three times a day (TID) | INTRAMUSCULAR | Status: DC
  Administered 2024-03-08 – 2024-03-21 (×36): 3 [IU] via SUBCUTANEOUS

## 2024-03-08 NOTE — Progress Notes (Signed)
 Received hand-off report from 6E nurse, Garland Junk, during the 1500 hour due to changes in staff assignment on unit.

## 2024-03-08 NOTE — Inpatient Diabetes Management (Signed)
 Inpatient Diabetes Program Recommendations  AACE/ADA: New Consensus Statement on Inpatient Glycemic Control   Target Ranges:  Prepandial:   less than 140 mg/dL      Peak postprandial:   less than 180 mg/dL (1-2 hours)      Critically ill patients:  140 - 180 mg/dL    Latest Reference Range & Units 03/07/24 08:33 03/07/24 11:49 03/07/24 18:29 03/07/24 21:10 03/08/24 08:20  Glucose-Capillary 70 - 99 mg/dL 409 (H) 811 (H) 914 (H) 278 (H) 189 (H)   Review of Glycemic Control  Diabetes history: DM2 Outpatient Diabetes medications: Metformin  1000 mg BID Current orders for Inpatient glycemic control: Semglee  10 units daily, Novolog  0-15 units TID with meals, Novolog  0-5 units QHS   Inpatient Diabetes Program Recommendations:     Insulin : Please consider increasing Semglee  to 12 units daily and Novolog  3 units TID with meals for meal coverage if patient eats at least 50% of meals.  Thanks,  Beacher Limerick, RN, MSN, CDCES Diabetes Coordinator Inpatient Diabetes Program [phone] (Team Pager from 8am to 5pm)

## 2024-03-08 NOTE — Progress Notes (Signed)
   Patient Name: Earl [name] Date of Encounter: 03/08/2024 [facility] Cardiologist: Richardo Chandler, MD    Interval Summary  .    Unable to get PICC. Pending decision on surgery. Converted to SR.  Vital Signs .    Vitals:   03/07/24 1949 03/08/24 0033 03/08/24 0345 03/08/24 0715  BP: 127/61 (!) 125/57 121/62 126/77  Pulse: 86 (!) 101 89 91  Resp: 16 20 16 18   Temp: 98.3 F (36.8 C) 98.1 F (36.7 C) 98.4 F (36.9 C) 98.3 F (36.8 C)  TempSrc: Oral Oral Oral Oral  SpO2: 96% 100% 98% 98%  Weight:      Height:        Intake/Output Summary (Last 24 hours) at 03/08/2024 1200 Last data filed at 03/08/2024 1115 Gross per 24 hour  Intake --  Output 1700 ml  Net -1700 ml      03/04/2024    4:56 PM 02/14/2024    7:52 PM 01/24/2024   11:05 AM  Last 3 Weights  Weight (lbs) 151 lb 159 lb 159 lb  Weight (kg) 68.493 kg 72.122 kg 72.122 kg      Telemetry/ECG    SR with paroxsyms of AF  Physical Exam .    GEN: Chronically ill appearing.  Neck: No JVD Cardiac: regular rate, no murmurs, rubs, or gallops.  Respiratory: Clear to auscultation bilaterally. Normal WOB on room air  GI: Soft, nontender, non-distended  MS: Bilateral hands and feet are swollen  Assessment & Plan .     Atrial Fib/flutter with RVR  - Resuming OAC when cleared by surgery - Now in SR - heparin  until cleared - will attempt to switch to PO Amiodarone ; if rates are stable, low terrm dose to be clarified tomorrow.  Chronic HFrEF Cardiac Amyloidosis  Non-ischemic cardiomyopathy  - Patient hypotensive- home entresto , metoprolol  held; BP is recovering - Continue Tafamadis for ATTR- CA- note this is not on formulary, family brought home medication. 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 - no IV lasix  today; some swelling is due to hypoalbuminemia; may need IV lasix  tomorrow  Minimal non-obstructive CAD - No ASA as he is anticoagulated for afib  - Continue statin therapy    Hypertension  Orthostatic hypotension  - BP currently low in the setting of sepsis but is improving - Continue midodrine  5 mg TID for now  Anemia  - stable hemoglobin  Otherwise per primary  - sepsis, sacral ulcer  - Type 2 DM  - Hyperthyroidism  - GERD  - History of CVA   For questions or updates, please contact [facility] Please consult [email or web address] for contact info under    Gloriann Larger, MD FASE [facility] Cardiologist [facility]  [address] [place], [address] [place], [place] [address] [phone]  12:00 PM

## 2024-03-08 NOTE — Plan of Care (Signed)
  Problem: Respiratory: Goal: Ability to maintain adequate ventilation will improve Outcome: Progressing   Problem: Coping: Goal: Psychosocial and spiritual needs will be supported Outcome: Progressing   Problem: Respiratory: Goal: Will maintain a patent airway Outcome: Progressing Goal: Complications related to the disease process, condition or treatment will be avoided or minimized Outcome: Progressing

## 2024-03-08 NOTE — Progress Notes (Signed)
 Pharmacy Antibiotic Note  Earl [name] is a 83 y.o. male admitted on 03/04/2024 with sepsis from sacral wounds.  Pharmacy has been consulted for vancomycin  and cefepime  dosing.   Vancomycin  trough = 12, non-toxic and adequate for wound penetration  Plan: Continue Cefepime  2g q8h  Continue Vancomycin  1500mg  q24h, estimated AUC: 462 Follow culture data for de-escalation.  Monitor renal function for dose adjustments as indicated.   Height: 5\' 11"  (180.3 cm) Weight: 68.5 kg (151 lb) IBW/kg (Calculated) : 75.3  Temp (24hrs), Avg:98.3 F (36.8 C), Min:98.1 F (36.7 C), Max:98.4 F (36.9 C)  Recent Labs  Lab 03/04/24 1706 03/04/24 1744 03/04/24 2004 03/05/24 0446 03/05/24 1412 03/06/24 0501 03/07/24 0633 03/08/24 0445 03/08/24 1822  WBC 13.3*  --   --  10.7*  --  10.6* 11.1* 12.5*  --   CREATININE 1.02  --   --  0.82  --  0.87  --  0.86  --   LATICACIDVEN  --  2.8* 2.3* 1.5 2.0*  --   --   --   --   VANCOTROUGH  --   --   --   --   --   --   --   --  12*    Estimated Creatinine Clearance: 64.2 mL/min (by C-G formula based on SCr of 0.86 mg/dL).    Allergies  Allergen Reactions   Penicillins Other (See Comments)    Did it involve swelling of the face/tongue/throat, SOB, or low BP? Unknown Did it involve sudden or severe rash/hives, skin peeling, or any reaction on the inside of your mouth or nose? Unknown Did you need to seek medical attention at a hospital or doctor's office? No When did it last happen?    Over 34 Years Ago   If all above answers are "NO", may proceed with cephalosporin use.      Antimicrobials this admission: Cefepime  4/15 >>  Vancomycin  4/15 >>  Flagyl  4/14>>  Microbiology results: 4/14 BCx: ngtd  Thank you for allowing pharmacy to be a part of this patient's care.  Armanda Bern, PharmD, BCPS Clinical Pharmacist **Pharmacist phone directory can now be found on [email or web address] (PW [email or web address]).  Listed under [facility].

## 2024-03-08 NOTE — Progress Notes (Signed)
 PROGRESS NOTE  Earl [name]  YQM:[id number] DOB: 06/12/1941 DOA: 03/04/2024 PCP: [name], MD   Brief Narrative: Patient is a 83 year old male with history of paroxysmal A-fib, insulin -dependent diabetes, hypertension, hyperlipidemia, CVA with residual right-sided weakness, MGUS, GERD, BPH, hypothyroidism, HFrEF with EF of 40 to 45%, chronic pancytopenia who presented to the emergency department from SNF with complaint of shortness of breath, wound, pain in the sacral area.  He was just discharged from here to [facility] rehab 4 days ago.  Hospital course was remarkable for acute metabolic encephalopathy, hypotension, hypoglycemia.  On presentation, he was hypotensive, EKG showed A-fib with RVR, tachypnea but saturating fine on room air.  Respiratory panel negative for COVID.  PCCM consulted for persistent hypotension.  Cardiology also consulted.  General surgery consulted for sacral ulcer.  Started on broad-spectrum antibiotics, amiodarone  drip.  Status post bedside excisional debridement of the sacral wound on 4/15 and 4/17.  Wound care, cardiology, general surgery following.  Assessment & Plan:  Principal Problem:   Severe sepsis (HCC) Active Problems:   Paroxysmal atrial fibrillation (HCC)   Sepsis (HCC)   Atrial flutter (HCC)   Sacral ulcer (HCC)   MGUS (monoclonal gammopathy of unknown significance)   Essential hypertension   Hyperlipidemia   Insulin  dependent type 2 diabetes mellitus (HCC)   History of CVA (cerebrovascular accident)   Cardiac amyloidosis (HCC)   Chronic systolic CHF (congestive heart failure) (HCC)   GERD (gastroesophageal reflux disease)   History of hypertension   Atrial fibrillation with RVR (HCC)  Sepsis secondary to sacral pressure wound/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  Hypotensive, encephalopathic, hypoglycemic, elevated lactate,  leukocytosis on presentation.  Started on IV fluid, broad spectrum antibiotics.  Follow-up cultures:NGTD Code sepsis was activated.  PCCM recommended to continue midodrine . Blood pressure has been better now with systolic in the range of 100s.  Currently he is alert and mostly oriented.  Chronic sacral wound with suspicion of infection: CT chest/abdomen/pelvis showed sacral decubitus wound with extension to the right gluteus muscle, no definitive bony lesions,no signs of osteomyelitis.  General surgery consulted,   Continue current antibiotics.  PT consulted for hydrotherapy.  Continue zinc , vitamin C .Status post bedside excisional debridement of the sacral wound on 4/15 and 4/17.  Observed to have purulent drainage.  Surgery recommended to continue twice daily wet-to-dry dressing changes with Dakin's.  General surgery planning to see him again on Monday and consider operative debridement.  Hopefully wound cultures will be obtained at the time.  We may need ID consultation at some point. Wound care nurse following for other pressure ulcers  History of paroxysmal A-fib: Presented with A-fib with RVR.  Toprol  was discontinued on last admission.  Started on amiodarone  drip.  Cardiology following.  Eliquis  on hold for possible need of debridement, on heparin  drip.  Continue digoxin .  Heart rate remains more than 100, on A-fib rhythm  History of HFrEF/transthyretin cardiac amyloidosis/nonischemic cardiomyopathy/hypertension: Presented with hypotension.  Takes Entresto , Toprol  at home,. Currently on hold.  Given IV fluids initially.    Cardiology team following.  Insulin -dependent diabetes type 2: Presented with hyperglycemia.  Recent A1c in the range of 8.  Diabetic coordinator consulted.  Currently on sliding scale.  Hyperglycemic.  Started on Lantus  12 units, sliding scale, meal coverage  History of CVA: Has a right-sided residual weakness.  On Eliquis , Lipitor  GERD: Continue Protonix  Anemia of  chronic disease: Continue  iron supplementation  Hypothyroidism: Continue levothyroxine   Recent history of COVID: COVID was positive on 02/14/2024.  Still positive.  Out of 21 days window, do not think he needs contact/airborne precaution  Severe protein calorie malnutrition/suspicion for dysphagia/hypophosphatemia: Speech therapy consulted.  Phosphorus supplemented.  Dietitian consulted.    Nutrition Problem: Inadequate oral intake Etiology: decreased appetite Pressure Injury 02/26/24 Coccyx Mid Unstageable - Full thickness tissue loss in which the base of the injury is covered by slough (yellow, tan, gray, green or brown) and/or eschar (tan, brown or black) in the wound bed. (Active)  02/26/24 0916  Location: Coccyx  Location Orientation: Mid  Staging: Unstageable - Full thickness tissue loss in which the base of the injury is covered by slough (yellow, tan, gray, green or brown) and/or eschar (tan, brown or black) in the wound bed.  Wound Description (Comments):   Present on Admission: No  Dressing Type Silicone dressing 03/07/24 0917     Pressure Injury 02/26/24 Toe (Comment  which one) Anterior;Right Deep Tissue Pressure Injury - Purple or maroon localized area of discolored intact skin or blood-filled blister due to damage of underlying soft tissue from pressure and/or shear. right gre (Active)  02/26/24 1215  Location: Toe (Comment  which one)  Location Orientation: Anterior;Right  Staging: Deep Tissue Pressure Injury - Purple or maroon localized area of discolored intact skin or blood-filled blister due to damage of underlying soft tissue from pressure and/or shear.  Wound Description (Comments): right great toe  Present on Admission:   Dressing Type Silicone dressing 03/07/24 0917     Pressure Injury 03/06/24 Hip Anterior;Left;Proximal Deep Tissue Pressure Injury - Purple or maroon localized area of discolored intact skin or blood-filled blister due to damage of underlying soft  tissue from pressure and/or shear. Fluid filled blister w (Active)  03/06/24 1612  Location: Hip  Location Orientation: Anterior;Left;Proximal  Staging: Deep Tissue Pressure Injury - Purple or maroon localized area of discolored intact skin or blood-filled blister due to damage of underlying soft tissue from pressure and/or shear.  Wound Description (Comments): Fluid filled blister with purple discoloration  Present on Admission: No  Dressing Type Silicone dressing 03/07/24 0917     Pressure Injury 03/06/24 Shoulder Left Deep Tissue Pressure Injury - Purple or maroon localized area of discolored intact skin or blood-filled blister due to damage of underlying soft tissue from pressure and/or shear. dark purple discoloration to should (Active)  03/06/24 1613  Location: Shoulder  Location Orientation: Left  Staging: Deep Tissue Pressure Injury - Purple or maroon localized area of discolored intact skin or blood-filled blister due to damage of underlying soft tissue from pressure and/or shear.  Wound Description (Comments): dark purple discoloration to shoulder  Present on Admission: No  Dressing Type Silicone dressing 03/07/24 0917    DVT prophylaxis:SCDs Start: 03/04/24 2151 Place TED hose Start: 03/04/24 2151     Code Status: Limited: Do not attempt resuscitation (DNR) -DNR-LIMITED -Do Not Intubate/DNI   Family Communication: Daughter at bedside on 4/18  Patient status:Inpatient  Patient is from :home  Anticipated discharge to:not sure  Estimated DC date:not sure   Consultants:Cardiology,pccm   Procedures:None yet  Antimicrobials:  Anti-infectives (From admission, onward)    Start     Dose/Rate Route Frequency Ordered Stop   03/07/24 2200  metroNIDAZOLE  (FLAGYL ) tablet 500 mg        500 mg Oral Every 12 hours 03/07/24 0840 03/12/24 0959   03/05/24 1930  vancomycin  (VANCOREADY) IVPB 1250 mg/250 mL  Status:  Discontinued        1,250 mg 166.7 mL/hr over 90 Minutes  Intravenous Every 24 hours 03/04/24 2205 03/04/24 2224   03/05/24 1930  Vancomycin  (VANCOCIN ) 1,250 mg in sodium chloride  0.9 % 250 mL IVPB  Status:  Discontinued        1,250 mg 166.7 mL/hr over 90 Minutes Intravenous Every 24 hours 03/04/24 2224 03/05/24 0717   03/05/24 1930  vancomycin  (VANCOREADY) IVPB 1500 mg/300 mL        1,500 mg 150 mL/hr over 120 Minutes Intravenous Every 24 hours 03/05/24 0717     03/05/24 1400  ceFEPIme  (MAXIPIME ) 2 g in sodium chloride  0.9 % 100 mL IVPB        2 g 200 mL/hr over 30 Minutes Intravenous Every 8 hours 03/05/24 0717     03/05/24 0600  ceFEPIme  (MAXIPIME ) 2 g in sodium chloride  0.9 % 100 mL IVPB  Status:  Discontinued        2 g 200 mL/hr over 30 Minutes Intravenous Every 12 hours 03/04/24 2205 03/05/24 0717   03/05/24 0000  metroNIDAZOLE  (FLAGYL ) IVPB 500 mg  Status:  Discontinued        500 mg 100 mL/hr over 60 Minutes Intravenous Every 12 hours 03/04/24 2206 03/04/24 2209   03/04/24 1730  aztreonam (AZACTAM) 2 g in sodium chloride  0.9 % 100 mL IVPB  Status:  Discontinued        2 g 200 mL/hr over 30 Minutes Intravenous  Once 03/04/24 1715 03/04/24 1724   03/04/24 1730  metroNIDAZOLE  (FLAGYL ) IVPB 500 mg  Status:  Discontinued        500 mg 100 mL/hr over 60 Minutes Intravenous Every 12 hours 03/04/24 1715 03/07/24 0840   03/04/24 1730  vancomycin  (VANCOREADY) IVPB 1500 mg/300 mL        1,500 mg 150 mL/hr over 120 Minutes Intravenous  Once 03/04/24 1715 03/04/24 2153   03/04/24 1730  ceFEPIme  (MAXIPIME ) 2 g in sodium chloride  0.9 % 100 mL IVPB        2 g 200 mL/hr over 30 Minutes Intravenous  Once 03/04/24 1724 03/04/24 1826       Subjective: Patient seen and examined at the bedside today.  He was lying on bed.  Heart rate in the range of 115.  Blood pressure stable.  Remains weak and deconditioned but overall comfortable, alert and oriented.  Daughter at bedside.  No new issues. continues to have poor oral intake  though.  Objective: Vitals:   03/07/24 1949 03/08/24 0033 03/08/24 0345 03/08/24 0715  BP: 127/61 (!) 125/57 121/62 126/77  Pulse: 86 (!) 101 89 91  Resp: 16 20 16 18   Temp: 98.3 F (36.8 C) 98.1 F (36.7 C) 98.4 F (36.9 C) 98.3 F (36.8 C)  TempSrc: Oral Oral Oral Oral  SpO2: 96% 100% 98% 98%  Weight:      Height:        Intake/Output Summary (Last 24 hours) at 03/08/2024 1046 Last data filed at 03/08/2024 0345 Gross per 24 hour  Intake --  Output 950 ml  Net -950 ml   Filed Weights   03/04/24 1656  Weight: 68.5 kg    Examination:  General exam: Chronically ill looking, weak, deconditioned, lying on  bed HEENT: PERRL Respiratory system:  no wheezes or crackles, diminished sounds in bases Cardiovascular system: Irregularly irregular rhythm.  Gastrointestinal system: Abdomen is nondistended, soft and nontender. Central nervous system: Alert and oriented Extremities: No edema, no clubbing ,no  cyanosis Skin: Scattered pressure ulcers, sacral pressure ulcer   Data Reviewed: I have personally reviewed following labs and imaging studies  CBC: Recent Labs  Lab 03/04/24 1706 03/05/24 0446 03/06/24 0501 03/07/24 0633 03/08/24 0445  WBC 13.3* 10.7* 10.6* 11.1* 12.5*  NEUTROABS 10.8*  --   --   --   --   HGB 10.8* 9.0* 9.9* 9.4* 9.5*  HCT 30.8* 25.3* 28.5* 27.3* 27.4*  MCV 84.2 84.1 84.1 84.3 83.8  PLT 378 270 340 297 302   Basic Metabolic Panel: Recent Labs  Lab 03/04/24 1706 03/05/24 0446 03/05/24 1412 03/06/24 0501 03/07/24 0633 03/08/24 0445  NA 130* 135  --  136  --  134*  K 5.0 4.2  --  4.1  --  3.7  CL 100 106  --  109  --  101  CO2 21* 23  --  20*  --  21*  GLUCOSE 500* 416*  --  166*  --  160*  BUN 28* 18  --  16  --  16  CREATININE 1.02 0.82  --  0.87  --  0.86  CALCIUM  7.8* 7.9*  --  7.8*  --  7.4*  MG  --   --  1.7 1.7  --   --   PHOS  --   --   --  2.1* 2.4* 2.1*     Recent Results (from the past 240 hours)  Resp panel by RT-PCR  (RSV, Flu A&B, Covid) Anterior Nasal Swab     Status: Abnormal   Collection Time: 03/04/24  5:38 PM   Specimen: Anterior Nasal Swab  Result Value Ref Range Status   SARS Coronavirus 2 by RT PCR POSITIVE (A) NEGATIVE Final   Influenza A by PCR NEGATIVE NEGATIVE Final   Influenza B by PCR NEGATIVE NEGATIVE Final    Comment: (NOTE) The Xpert Xpress SARS-CoV-2/FLU/RSV plus assay is intended as an aid in the diagnosis of influenza from Nasopharyngeal swab specimens and should not be used as a sole basis for treatment. Nasal washings and aspirates are unacceptable for Xpert Xpress SARS-CoV-2/FLU/RSV testing.  Fact Sheet for Patients: BloggerCourse.com  Fact Sheet for Healthcare Providers: SeriousBroker.it  This test is not yet approved or cleared by the [place]  FDA and has been authorized for detection and/or diagnosis of SARS-CoV-2 by FDA under an Emergency Use Authorization (EUA). This EUA will remain in effect (meaning this test can be used) for the duration of the COVID-19 declaration under Section 564(b)(1) of the Act, 21 U.S.C. section 360bbb-3(b)(1), unless the authorization is terminated or revoked.     Resp Syncytial Virus by PCR NEGATIVE NEGATIVE Final    Comment: (NOTE) Fact Sheet for Patients: BloggerCourse.com  Fact Sheet for Healthcare Providers: SeriousBroker.it  This test is not yet approved or cleared by the [place]  FDA and has been authorized for detection and/or diagnosis of SARS-CoV-2 by FDA under an Emergency Use Authorization (EUA). This EUA will remain in effect (meaning this test can be used) for the duration of the COVID-19 declaration under Section 564(b)(1) of the Act, 21 U.S.C. section 360bbb-3(b)(1), unless the authorization is terminated or revoked.  Performed at [facility], [address] [place], [place] [address]   Culture,  blood (x 2)     Status: None (Preliminary result)   Collection Time: 03/04/24  5:44 PM   Specimen: BLOOD LEFT HAND  Result Value Ref Range Status   Specimen Description BLOOD LEFT HAND  Final   Special  Requests   Final    BOTTLES DRAWN AEROBIC ONLY Blood Culture adequate volume   Culture   Final    NO GROWTH 4 DAYS Performed at [facility], [address], [place], [place] [address]    Report Status PENDING  Incomplete  Culture, blood (x 2)     Status: None (Preliminary result)   Collection Time: 03/04/24  6:14 PM   Specimen: BLOOD RIGHT HAND  Result Value Ref Range Status   Specimen Description BLOOD RIGHT HAND  Final   Special Requests   Final    BOTTLES DRAWN AEROBIC ONLY Blood Culture results may not be optimal due to an inadequate volume of blood received in culture bottles   Culture   Final    NO GROWTH 4 DAYS Performed at [facility], [address], [place], [place] [address]    Report Status PENDING  Incomplete     Radiology Studies: US  EKG SITE RITE Result Date: 03/07/2024 If Site Rite image not attached, placement could not be confirmed due to current cardiac rhythm.   Scheduled Meds:  amiodarone   400 mg Oral BID   ascorbic acid   500 mg Oral BID   atorvastatin   40 mg Oral Daily   Chlorhexidine  Gluconate Cloth  6 each Topical Daily   [START ON 03/10/2024] collagenase    Topical Daily   digoxin   0.125 mg Oral Daily   feeding supplement (GLUCERNA SHAKE)  237 mL Oral TID BM   ferrous sulfate   325 mg Oral QHS   insulin  aspart  0-15 Units Subcutaneous TID WC   insulin  aspart  0-5 Units Subcutaneous QHS   insulin  aspart  3 Units Subcutaneous TID WC   [START ON 03/09/2024] insulin  glargine-yfgn  12 Units Subcutaneous Daily   leptospermum manuka honey  1 Application Topical Daily   levothyroxine   50 mcg Oral Q0600   liver oil-zinc  oxide   Topical BID   metroNIDAZOLE   500 mg Oral Q12H   midodrine   5 mg Oral TID WC   phosphorus  500 mg Oral TID    polyethylene glycol  17 g Oral Daily   senna-docusate  1 tablet Oral BID   sodium chloride  flush  3 mL Intravenous Q12H   sodium hypochlorite   Irrigation BID   Tafamidis   1 capsule Oral Daily   zinc  sulfate (50mg  elemental zinc )  220 mg Oral Daily   Continuous Infusions:  ceFEPime  (MAXIPIME ) IV 2 g (03/08/24 0614)   heparin  1,100 Units/hr (03/07/24 1519)   vancomycin  1,500 mg (03/07/24 1843)     LOS: 4 days   Leona Rake, MD [facility] P4/18/2025, 10:46 AM

## 2024-03-08 NOTE — Progress Notes (Signed)
 Physical Therapy Wound Treatment Patient Details  Name: Earl [name] MRN: [id number] Date of Birth: 1941-02-15  Today's Date: 03/08/2024 Time: 8640-8554 Time Calculation (min): 46 min  Subjective  Subjective Assessment Subjective: Pt and daughter pleasant and agreeable to wound therapy Patient and Family Stated Goals: Heal wound Date of Onset:  (Unknown) Prior Treatments: Dressing changes, off weighting  Pain Score:  Premedicated but pt calling out in pain at times  Wound Assessment  Pressure Injury 02/26/24 Coccyx Mid Unstageable - Full thickness tissue loss in which the base of the injury is covered by slough (yellow, tan, gray, green or brown) and/or eschar (tan, brown or black) in the wound bed. (Active)  Dressing Type Foam - Lift dressing to assess site every shift;Gauze (Comment);Dakin's-soaked gauze;Barrier Film (skin prep);Moist to moist 03/08/24 1534  Dressing Changed;Clean, Dry, Intact 03/08/24 1534  Dressing Change Frequency Twice a day 03/08/24 1534  State of Healing Early/partial granulation 03/08/24 1534  Site / Wound Assessment Painful;Red;Yellow;Brown 03/08/24 1534  % Wound base Red or Granulating 40% 03/08/24 1534  % Wound base Yellow/Fibrinous Exudate 50% 03/08/24 1534  % Wound base Black/Eschar 10% 03/08/24 1534  % Wound base Other/Granulation Tissue (Comment) 0% 03/08/24 1534  Peri-wound Assessment Pink 03/08/24 1534  Wound Length (cm) 9 cm 03/05/24 1520  Wound Width (cm) 5 cm 03/05/24 1520  Wound Depth (cm) 3 cm 03/05/24 1520  Wound Surface Area (cm^2) 45 cm^2 03/05/24 1520  Wound Volume (cm^3) 135 cm^3 03/05/24 1520  Tunneling (cm) up to 5 cm from 12:00 to 2:00 03/05/24 1520  Undermining (cm) 0 03/05/24 1520  Margins Unattached edges (unapproximated) 03/08/24 1534  Drainage Amount Moderate 03/08/24 1534  Drainage Description Purulent 03/08/24 1534  Treatment Debridement (Selective);Irrigation;Other (Comment) 03/08/24 1534      Selective Debridement  (non-excisional) Selective Debridement (non-excisional) - Location: Sacrum Selective Debridement (non-excisional) - Tools Used: Forceps, Scalpel, Scissors Selective Debridement (non-excisional) - Tissue Removed: Black and yellow unviable tissue    Wound Assessment and Plan  Wound Therapy - Assess/Plan/Recommendations Wound Therapy - Clinical Statement: Wound bed appears improved. There continues to be purulent drainage when packing is removed. Unable to visualize tunnel and feel there is likely necrotic into the tunnel. This patient will benefit from continued selective removal of unviable tissue, to decrease bioburden, and promote wound bed healing. Wound Therapy - Functional Problem List: Decreased tolerance for position changes or OOB. Factors Delaying/Impairing Wound Healing: Diabetes Mellitus, Immobility, Infection - systemic/local, Multiple medical problems Hydrotherapy Plan: Debridement, Dressing change, Patient/family education Wound Therapy - Frequency: 2X / week Wound Therapy - Follow Up Recommendations: dressing changes by RN  Wound Therapy Goals- Improve the function of patient's integumentary system by progressing the wound(s) through the phases of wound healing (inflammation - proliferation - remodeling) by: Wound Therapy Goals - Improve the function of patient's integumentary system by progressing the wound(s) through the phases of wound healing by: Decrease Necrotic Tissue to: 20% Decrease Necrotic Tissue - Progress: Progressing toward goal Increase Granulation Tissue to: 80% Increase Granulation Tissue - Progress: Progressing toward goal Goals/treatment plan/discharge plan were made with and agreed upon by patient/family: Yes Time For Goal Achievement: 7 days Wound Therapy - Potential for Goals: Good  Goals will be updated until maximal potential achieved or discharge criteria met.  Discharge criteria: when goals achieved, discharge from hospital, MD decision/surgical  intervention, no progress towards goals, refusal/missing three consecutive treatments without notification or medical reason.  GP     Charges PT Wound Care Charges $Wound Debridement up to  20 cm: < or equal to 20 cm $ Wound Debridement each add'l 20 sqcm: 1 $PT Hydrotherapy Dressing: 1 dressing $PT Hydrotherapy Visit: 1 Visit       [name] 03/08/2024, 3:40 PM  Leita Sable, PT, DPT Acute Rehabilitation Services Secure Chat Preferred Office: [phone]

## 2024-03-08 NOTE — Progress Notes (Signed)
 PHARMACY - ANTICOAGULATION CONSULT NOTE  Pharmacy Consult for heparin   Indication: atrial fibrillation  Allergies  Allergen Reactions   Penicillins Other (See Comments)    Did it involve swelling of the face/tongue/throat, SOB, or low BP? Unknown Did it involve sudden or severe rash/hives, skin peeling, or any reaction on the inside of your mouth or nose? Unknown Did you need to seek medical attention at a hospital or doctor's office? No When did it last happen?    Over 59 Years Ago   If all above answers are "NO", may proceed with cephalosporin use.      Patient Measurements: Height: 5\' 11"  (180.3 cm) Weight: 68.5 kg (151 lb) IBW/kg (Calculated) : 75.3 HEPARIN  DW (KG): 68.5  Vital Signs: Temp: 98.3 F (36.8 C) (04/18 0715) Temp Source: Oral (04/18 0715) BP: 126/77 (04/18 0715) Pulse Rate: 91 (04/18 0715)  Labs: Recent Labs    03/06/24 0501 03/07/24 0633 03/08/24 0445  HGB 9.9* 9.4* 9.5*  HCT 28.5* 27.3* 27.4*  PLT 340 297 302  APTT 73* 76* 65*  HEPARINUNFRC 1.00* 0.58 0.45  CREATININE 0.87  --  0.86    Estimated Creatinine Clearance: 64.2 mL/min (by C-G formula based on SCr of 0.86 mg/dL).   Medical History: Past Medical History:  Diagnosis Date   Adenomatous colon polyp    Allergy    Anal fissure    Arthritis    BPH (benign prostatic hyperplasia)    Diabetes mellitus (HCC)    GERD (gastroesophageal reflux disease)    Hyperlipidemia    Hypertension    Hypothyroidism    Other testicular hypofunction    Stroke [facility])    Assessment: Patient admitted with CC of afib with RVR + potential sepsis and hypotension due to UTI vs. Sacral wound. Patient on Eliquis  PTA, held for potential surgical intervention on sacral ulcer. Last dose of Eliquis  on 4/14 @ 08:00am. Pharmacy consulted to dose heparin .   -aPTT  65 and slightly below goal on heparin  1100 units/hr -Heparin  level 0.45 and trending down -CBC stable   Goal of Therapy:  Heparin  level 0.3-0.7  units/ml aPTT 66-102 Monitor platelets by anticoagulation protocol: Yes   Plan:  -Increase heparin  to 1150 units/hr -Daily heparin  level, aptt and CBC  Baxter Limber, PharmD Clinical Pharmacist **Pharmacist phone directory can now be found on [email or web address] ([email or web address]).  Listed under [facility].

## 2024-03-09 DIAGNOSIS — I5022 Chronic systolic (congestive) heart failure: Secondary | ICD-10-CM | POA: Diagnosis not present

## 2024-03-09 DIAGNOSIS — R6520 Severe sepsis without septic shock: Secondary | ICD-10-CM | POA: Diagnosis not present

## 2024-03-09 DIAGNOSIS — I4891 Unspecified atrial fibrillation: Secondary | ICD-10-CM | POA: Diagnosis not present

## 2024-03-09 DIAGNOSIS — A419 Sepsis, unspecified organism: Secondary | ICD-10-CM | POA: Diagnosis not present

## 2024-03-09 DIAGNOSIS — I43 Cardiomyopathy in diseases classified elsewhere: Secondary | ICD-10-CM | POA: Diagnosis not present

## 2024-03-09 DIAGNOSIS — E854 Organ-limited amyloidosis: Secondary | ICD-10-CM | POA: Diagnosis not present

## 2024-03-09 LAB — CBC
HCT: 26.4 % — ABNORMAL LOW (ref 39.0–52.0)
Hemoglobin: 9.3 g/dL — ABNORMAL LOW (ref 13.0–17.0)
MCH: 29.3 pg (ref 26.0–34.0)
MCHC: 35.2 g/dL (ref 30.0–36.0)
MCV: 83.3 fL (ref 80.0–100.0)
Platelets: 272 10*3/uL (ref 150–400)
RBC: 3.17 MIL/uL — ABNORMAL LOW (ref 4.22–5.81)
RDW: 14 % (ref 11.5–15.5)
WBC: 10.2 10*3/uL (ref 4.0–10.5)
nRBC: 0 % (ref 0.0–0.2)

## 2024-03-09 LAB — APTT: aPTT: 45 s — ABNORMAL HIGH (ref 24–36)

## 2024-03-09 LAB — PHOSPHORUS: Phosphorus: 2.3 mg/dL — ABNORMAL LOW (ref 2.5–4.6)

## 2024-03-09 LAB — CULTURE, BLOOD (ROUTINE X 2)
Culture: NO GROWTH
Culture: NO GROWTH
Special Requests: ADEQUATE

## 2024-03-09 LAB — HEPARIN LEVEL (UNFRACTIONATED)
Heparin Unfractionated: 0.18 [IU]/mL — ABNORMAL LOW (ref 0.30–0.70)
Heparin Unfractionated: 0.36 [IU]/mL (ref 0.30–0.70)

## 2024-03-09 LAB — GLUCOSE, CAPILLARY
Glucose-Capillary: 242 mg/dL — ABNORMAL HIGH (ref 70–99)
Glucose-Capillary: 322 mg/dL — ABNORMAL HIGH (ref 70–99)
Glucose-Capillary: 325 mg/dL — ABNORMAL HIGH (ref 70–99)
Glucose-Capillary: 248 mg/dL — ABNORMAL HIGH (ref 70–99)

## 2024-03-09 MED ORDER — AMIODARONE HCL 200 MG PO TABS
200.0000 mg | ORAL_TABLET | Freq: Every day | ORAL | Status: DC
  Administered 2024-03-17 – 2024-08-21 (×160): 200 mg via ORAL
  Filled 2024-03-09 (×159): qty 1

## 2024-03-09 MED ORDER — AMIODARONE HCL 200 MG PO TABS
200.0000 mg | ORAL_TABLET | Freq: Two times a day (BID) | ORAL | Status: AC
  Administered 2024-03-09 – 2024-03-16 (×14): 200 mg via ORAL
  Filled 2024-03-09 (×14): qty 1

## 2024-03-09 NOTE — Plan of Care (Signed)
  Problem: Fluid Volume: Goal: Hemodynamic stability will improve Outcome: Progressing   Problem: Clinical Measurements: Goal: Diagnostic test results will improve Outcome: Progressing Goal: Signs and symptoms of infection will decrease Outcome: Progressing   Problem: Respiratory: Goal: Ability to maintain adequate ventilation will improve Outcome: Progressing   Problem: Education: Goal: Knowledge of risk factors and measures for prevention of condition will improve Outcome: Progressing   Problem: Coping: Goal: Psychosocial and spiritual needs will be supported Outcome: Progressing   Problem: Respiratory: Goal: Will maintain a patent airway Outcome: Progressing Goal: Complications related to the disease process, condition or treatment will be avoided or minimized Outcome: Progressing   Problem: Education: Goal: Ability to describe self-care measures that may prevent or decrease complications (Diabetes Survival Skills Education) will improve Outcome: Progressing Goal: Individualized Educational Video(s) Outcome: Progressing   Problem: Coping: Goal: Ability to adjust to condition or change in health will improve Outcome: Progressing   Problem: Fluid Volume: Goal: Ability to maintain a balanced intake and output will improve Outcome: Progressing   Problem: Health Behavior/Discharge Planning: Goal: Ability to identify and utilize available resources and services will improve Outcome: Progressing Goal: Ability to manage health-related needs will improve Outcome: Progressing   Problem: Metabolic: Goal: Ability to maintain appropriate glucose levels will improve Outcome: Progressing   Problem: Nutritional: Goal: Maintenance of adequate nutrition will improve Outcome: Progressing Goal: Progress toward achieving an optimal weight will improve Outcome: Progressing   Problem: Skin Integrity: Goal: Risk for impaired skin integrity will decrease Outcome: Progressing    Problem: Tissue Perfusion: Goal: Adequacy of tissue perfusion will improve Outcome: Progressing   Problem: Education: Goal: Knowledge of General Education information will improve Description: Including pain rating scale, medication(s)/side effects and non-pharmacologic comfort measures Outcome: Progressing   Problem: Health Behavior/Discharge Planning: Goal: Ability to manage health-related needs will improve Outcome: Progressing   Problem: Clinical Measurements: Goal: Ability to maintain clinical measurements within normal limits will improve Outcome: Progressing Goal: Will remain free from infection Outcome: Progressing Goal: Diagnostic test results will improve Outcome: Progressing Goal: Respiratory complications will improve Outcome: Progressing Goal: Cardiovascular complication will be avoided Outcome: Progressing   Problem: Activity: Goal: Risk for activity intolerance will decrease Outcome: Progressing   Problem: Nutrition: Goal: Adequate nutrition will be maintained Outcome: Progressing   Problem: Coping: Goal: Level of anxiety will decrease Outcome: Progressing   Problem: Elimination: Goal: Will not experience complications related to bowel motility Outcome: Progressing Goal: Will not experience complications related to urinary retention Outcome: Progressing   Problem: Pain Managment: Goal: General experience of comfort will improve and/or be controlled Outcome: Progressing   Problem: Safety: Goal: Ability to remain free from injury will improve Outcome: Progressing   Problem: Skin Integrity: Goal: Risk for impaired skin integrity will decrease Outcome: Progressing

## 2024-03-09 NOTE — Progress Notes (Signed)
 Cardiology Progress Note  Patient ID: Earl [name] MRN: [id number] DOB: 1940-12-01 Date of Encounter: 03/09/2024 Primary Cardiologist: Richardo Chandler, MD  Subjective   Chief Complaint: Weakness   HPI: Family at bedside.  Reports he is weak and fatigued.  Has not had a bowel movement in several days per daughter.  ROS:  All other ROS reviewed and negative. Pertinent positives noted in the HPI.     Telemetry  Overnight telemetry shows sinus rhythm 90s, which I personally reviewed.   Physical Exam   Vitals:   03/08/24 2030 03/09/24 0018 03/09/24 0450 03/09/24 0842  BP: 133/72 (!) 135/58 (!) 125/57 124/66  Pulse: 93 84 85   Resp: (!) 23 (!) 25 19 (!) 23  Temp: 99.1 F (37.3 C) 98.4 F (36.9 C) 98.4 F (36.9 C) 98.8 F (37.1 C)  TempSrc: Oral Oral Oral Oral  SpO2: 97% 99% 96%   Weight:      Height:        Intake/Output Summary (Last 24 hours) at 03/09/2024 1114 Last data filed at 03/09/2024 1110 Gross per 24 hour  Intake 1681.49 ml  Output 2350 ml  Net -668.51 ml       03/04/2024    4:56 PM 02/14/2024    7:52 PM 01/24/2024   11:05 AM  Last 3 Weights  Weight (lbs) 151 lb 159 lb 159 lb  Weight (kg) 68.493 kg 72.122 kg 72.122 kg    Body mass index is 21.06 kg/m.  General: Frail Head: Atraumatic, normal size  Eyes: PEERLA, EOMI  Neck: Supple, no JVD Endocrine: No thryomegaly Cardiac: Normal S1, S2; RRR; no murmurs, rubs, or gallops Lungs: Clear to auscultation bilaterally, no wheezing, rhonchi or rales  Abd: Soft, nontender, no hepatomegaly  Ext: No edema, pulses 2+ Musculoskeletal: No deformities, BUE and BLE strength normal and equal Skin: Warm and dry, no rashes   Neuro: Alert and oriented to person, place, time, and situation, CNII-XII grossly intact, no focal deficits  Psych: Normal mood and affect   Cardiac Studies  TTE 09/20/2023  1. Left ventricular ejection fraction, by estimation, is 45 to 50%. The  left ventricle has mildly decreased function. The  left ventricle  demonstrates global hypokinesis. There is moderate concentric left  ventricular hypertrophy. Left ventricular  diastolic parameters are consistent with Grade II diastolic dysfunction  (pseudonormalization). The average left ventricular global longitudinal  strain is -12.7 %. The global longitudinal strain is abnormal.   2. Right ventricular systolic function is normal. The right ventricular  size is mildly enlarged. There is mildly elevated pulmonary artery  systolic pressure. The estimated right ventricular systolic pressure is  38.0 mmHg.   3. Left atrial size was mild to moderately dilated.   4. Right atrial size was mildly dilated.   5. The mitral valve is normal in structure. Mild mitral valve  regurgitation. No evidence of mitral stenosis.   6. The aortic valve is tricuspid. Aortic valve regurgitation is mild. No  aortic stenosis is present.   7. The inferior vena cava is normal in size with greater than 50%  respiratory variability, suggesting right atrial pressure of 3 mmHg.   Patient Profile  Earl [name] is a 83 y.o. male with transthyretin cardiac amyloidosis (hereditary), systolic heart failure EF 45 to 50%, hypertension, hyperlipidemia, paroxysmal A-fib, stroke with residual right-sided deficits, chronic pancytopenia admitted on 03/04/2024 with sepsis secondary to infected sacral decubitus ulcer.  Cardiology was consulted for A-fib management and hypotension.  Assessment & Plan   #  Chronic systolic heart failure, EF 45 to 50% # Transthyretin cardiac amyloidosis, hereditary # Sepsis secondary to infected sacral decubitus ulcer - Blood pressures remain marginal.  Volume status is stable. - He is still recovering from his infection and may need surgery. - Currently on midodrine  5 mg 3 times daily.  Would continue with this.  He has multiple comorbidities.  I believe aggressive cardiovascular care is not the best option right now.  Hold Entresto .  Hold  metoprolol .  I am stopping his digoxin .  Would just recommend midodrine  for a very conservative course. - On tafamidis  and amvuttra at home.  - No signs of volume overload.  Can be given Lasix  20 mg p.o. as needed.  # Paroxysmal A-fib - Maintaining sinus rhythm on amiodarone .  Transition to 200 mg twice daily amiodarone  for 7 days followed by 200 mg daily.  Amiodarone  is preferred.  - on heparin  drip. Start eliquis  once sugeries are complete.   # Orthostatic hypotension - Multifactorial in the setting of cardiac amyloidosis, systolic heart failure and sepsis.  Midodrine  as above.  # Anemia - Stable      For questions or updates, please contact [facility] Please consult [email or web address] for contact info under        Signed, Melodee Spruce T. Rolm Clos, MD, [facility]  [facility]  03/09/2024 11:14 AM

## 2024-03-09 NOTE — Progress Notes (Signed)
 PHARMACY - ANTICOAGULATION CONSULT NOTE  Pharmacy Consult for heparin   Indication: atrial fibrillation  Allergies  Allergen Reactions   Penicillins Other (See Comments)    Did it involve swelling of the face/tongue/throat, SOB, or low BP? Unknown Did it involve sudden or severe rash/hives, skin peeling, or any reaction on the inside of your mouth or nose? Unknown Did you need to seek medical attention at a hospital or doctor's office? No When did it last happen?    Over 89 Years Ago   If all above answers are "NO", may proceed with cephalosporin use.      Patient Measurements: Height: 5\' 11"  (180.3 cm) Weight: 68.5 kg (151 lb) IBW/kg (Calculated) : 75.3 HEPARIN  DW (KG): 68.5  Vital Signs: Temp: 98.7 F (37.1 C) (04/19 1624) Temp Source: Oral (04/19 1624) BP: 118/55 (04/19 1624)  Labs: Recent Labs    03/07/24 1610 03/08/24 0445 03/09/24 0832 03/09/24 1844  HGB 9.4* 9.5* 9.3*  --   HCT 27.3* 27.4* 26.4*  --   PLT 297 302 272  --   APTT 76* 65* 45*  --   HEPARINUNFRC 0.58 0.45 0.18* 0.36  CREATININE  --  0.86  --   --     Estimated Creatinine Clearance: 64.2 mL/min (by C-G formula based on SCr of 0.86 mg/dL).   Medical History: Past Medical History:  Diagnosis Date   Adenomatous colon polyp    Allergy    Anal fissure    Arthritis    BPH (benign prostatic hyperplasia)    Diabetes mellitus (HCC)    GERD (gastroesophageal reflux disease)    Hyperlipidemia    Hypertension    Hypothyroidism    Other testicular hypofunction    Stroke [facility])    Assessment: Patient admitted with CC of afib with RVR + potential sepsis and hypotension due to UTI vs. Sacral wound. Patient on Eliquis  PTA, held for potential surgical intervention on sacral ulcer. Last dose of Eliquis  on 4/14 @ 08:00am. Pharmacy consulted to dose heparin .   Heparin  level this evening came back therapeutic at 0.36, on heparin  infusion at 1300 units/hr. Hgb 9.3, plt 272. No s/sx of bleeding or infusion  issues per RN.   Goal of Therapy:  Heparin  level 0.3-0.7 units/ml Monitor platelets by anticoagulation protocol: Yes   Plan:  -Continue heparin  infusion at 1300 units/hr -Heparin  level in 8 hours with AM labs  -Daily heparin  level and CBC  Thank you for allowing pharmacy to participate in this patient's care,  Nieves Bars, PharmD, BCCCP Clinical Pharmacist  Phone: [phone] 03/09/2024 7:11 PM  Please check AMION for all [facility] Pharmacy phone numbers After 10:00 PM, call Main Pharmacy [phone]

## 2024-03-09 NOTE — Progress Notes (Signed)
 PROGRESS NOTE  Earl [name]  ZOX:[id number] DOB: 1941/02/21 DOA: 03/04/2024 PCP: [name], MD   Brief Narrative: Patient is a 83 year old male with history of paroxysmal A-fib, insulin -dependent diabetes, hypertension, hyperlipidemia, CVA with residual right-sided weakness, MGUS, GERD, BPH, hypothyroidism, HFrEF with EF of 40 to 45%, chronic pancytopenia who presented to the emergency department from SNF with complaint of shortness of breath, wound, pain in the sacral area.  He was just discharged from here to [facility] rehab 4 days ago.  Hospital course was remarkable for acute metabolic encephalopathy, hypotension, hypoglycemia.  On presentation, he was hypotensive, EKG showed A-fib with RVR, tachypnea but saturating fine on room air.  Respiratory panel negative for COVID.  PCCM consulted for persistent hypotension.  Cardiology also consulted.  General surgery consulted for sacral ulcer.  Started on broad-spectrum antibiotics, amiodarone  drip.  Status post bedside excisional debridement of the sacral wound on 4/15 and 4/17.  Wound care, cardiology, general surgery following.  Assessment & Plan:  Principal Problem:   Severe sepsis (HCC) Active Problems:   Paroxysmal atrial fibrillation (HCC)   Sepsis (HCC)   Atrial flutter (HCC)   Sacral ulcer (HCC)   MGUS (monoclonal gammopathy of unknown significance)   Essential hypertension   Hyperlipidemia   Insulin  dependent type 2 diabetes mellitus (HCC)   History of CVA (cerebrovascular accident)   Cardiac amyloidosis (HCC)   Chronic systolic CHF (congestive heart failure) (HCC)   GERD (gastroesophageal reflux disease)   History of hypertension   Atrial fibrillation with RVR (HCC)  Sepsis secondary to sacral pressure wound/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  Hypotensive, encephalopathic, hypoglycemic, elevated lactate,  leukocytosis on presentation.  Started on IV fluid, broad spectrum antibiotics.  Follow-up cultures:NGTD Code sepsis was activated.  PCCM recommended to continue midodrine . Blood pressure has been better now with systolic in the range of 100s.  Currently he is alert and mostly oriented.  Chronic sacral wound with suspicion of infection: CT chest/abdomen/pelvis showed sacral decubitus wound with extension to the right gluteus muscle, no definitive bony lesions,no signs of osteomyelitis.  General surgery consulted,   Continue current antibiotics.  PT consulted for hydrotherapy.  Continue zinc , vitamin C .Status post bedside excisional debridement of the sacral wound on 4/15 and 4/17.  Observed to have purulent drainage.  Surgery recommended to continue twice daily wet-to-dry dressing changes with Dakin's.  General surgery planning to see him again on Monday and consider operative debridement.  Hopefully wound cultures will be obtained at the time.  We may need ID consultation at some point. Wound care nurse following for other pressure ulcers  History of paroxysmal A-fib: Presented with A-fib with RVR.  Toprol  was discontinued on last admission.  Started on amiodarone  drip.  Cardiology following.  Eliquis  on hold for possible need of debridement, on heparin  drip.  He remains in normal sinus rhythm with controlled heart rate today.  Digoxin  discontinued.  Amiodarone  changed to oral  History of HFrEF/transthyretin cardiac amyloidosis/nonischemic cardiomyopathy/hypertension: Presented with hypotension.  Takes Entresto , Toprol  at home,. Currently on hold.  Given IV fluids initially.    Cardiology team was following,continue midodrine   Insulin -dependent diabetes type 2: Presented with hyperglycemia.  Recent A1c in the range of 8.  Diabetic coordinator consulted.  Currently on sliding scale.  Hyperglycemic.  Started on Lantus  12 units, sliding scale, meal coverage  History of CVA: Has a right-sided residual  weakness.  On Eliquis , Lipitor  GERD: Continue Protonix  Anemia of chronic disease: Continue iron supplementation  Hypothyroidism: Continue levothyroxine   Recent history of COVID: COVID was positive on 02/14/2024.  Still positive.  Out of 21 days window, do not think he needs contact/airborne precaution  Severe protein calorie malnutrition/suspicion for dysphagia/hypophosphatemia: Speech therapy consulted.  Phosphorus being  supplemented.  Dietitian consulted.  Goals of care: Very deconditioned elderly male with bedbound status, multiple comorbidities, sacral pressure ulcer.  CODE STATUS DNR.  Palliative care consulted for goals of care.  Daughter receptive for goals of care discussion    Nutrition Problem: Inadequate oral intake Etiology: decreased appetite Pressure Injury 02/26/24 Coccyx Mid Unstageable - Full thickness tissue loss in which the base of the injury is covered by slough (yellow, tan, gray, green or brown) and/or eschar (tan, brown or black) in the wound bed. (Active)  02/26/24 0916  Location: Coccyx  Location Orientation: Mid  Staging: Unstageable - Full thickness tissue loss in which the base of the injury is covered by slough (yellow, tan, gray, green or brown) and/or eschar (tan, brown or black) in the wound bed.  Wound Description (Comments):   Present on Admission: No  Dressing Type Foam - Lift dressing to assess site every shift 03/08/24 2140     Pressure Injury 02/26/24 Toe (Comment  which one) Anterior;Right Deep Tissue Pressure Injury - Purple or maroon localized area of discolored intact skin or blood-filled blister due to damage of underlying soft tissue from pressure and/or shear. right gre (Active)  02/26/24 1215  Location: Toe (Comment  which one)  Location Orientation: Anterior;Right  Staging: Deep Tissue Pressure Injury - Purple or maroon localized area of discolored intact skin or blood-filled blister due to damage of underlying soft tissue from pressure  and/or shear.  Wound Description (Comments): right great toe  Present on Admission:   Dressing Type Silicone dressing 03/08/24 2140     Pressure Injury 03/06/24 Hip Anterior;Left;Proximal Deep Tissue Pressure Injury - Purple or maroon localized area of discolored intact skin or blood-filled blister due to damage of underlying soft tissue from pressure and/or shear. Fluid filled blister w (Active)  03/06/24 1612  Location: Hip  Location Orientation: Anterior;Left;Proximal  Staging: Deep Tissue Pressure Injury - Purple or maroon localized area of discolored intact skin or blood-filled blister due to damage of underlying soft tissue from pressure and/or shear.  Wound Description (Comments): Fluid filled blister with purple discoloration  Present on Admission: No  Dressing Type Silicone dressing 03/08/24 2140     Pressure Injury 03/06/24 Shoulder Left Deep Tissue Pressure Injury - Purple or maroon localized area of discolored intact skin or blood-filled blister due to damage of underlying soft tissue from pressure and/or shear. dark purple discoloration to should (Active)  03/06/24 1613  Location: Shoulder  Location Orientation: Left  Staging: Deep Tissue Pressure Injury - Purple or maroon localized area of discolored intact skin or blood-filled blister due to damage of underlying soft tissue from pressure and/or shear.  Wound Description (Comments): dark purple discoloration to shoulder  Present on Admission: No  Dressing Type Silicone dressing 03/08/24 2140    DVT prophylaxis:SCDs Start: 03/04/24 2151 Place TED hose Start: 03/04/24 2151     Code Status: Limited: Do not attempt resuscitation (DNR) -DNR-LIMITED -Do Not Intubate/DNI   Family Communication: Daughter at bedside on 4/19  Patient status:Inpatient  Patient is from :home  Anticipated discharge to:not sure  Estimated DC date:not sure   Consultants:Cardiology,pccm   Procedures:None yet  Antimicrobials:   Anti-infectives (From  admission, onward)    Start     Dose/Rate Route Frequency Ordered Stop   03/07/24 2200  metroNIDAZOLE  (FLAGYL ) tablet 500 mg        500 mg Oral Every 12 hours 03/07/24 0840 03/12/24 0959   03/05/24 1930  vancomycin  (VANCOREADY) IVPB 1250 mg/250 mL  Status:  Discontinued        1,250 mg 166.7 mL/hr over 90 Minutes Intravenous Every 24 hours 03/04/24 2205 03/04/24 2224   03/05/24 1930  Vancomycin  (VANCOCIN ) 1,250 mg in sodium chloride  0.9 % 250 mL IVPB  Status:  Discontinued        1,250 mg 166.7 mL/hr over 90 Minutes Intravenous Every 24 hours 03/04/24 2224 03/05/24 0717   03/05/24 1930  vancomycin  (VANCOREADY) IVPB 1500 mg/300 mL        1,500 mg 150 mL/hr over 120 Minutes Intravenous Every 24 hours 03/05/24 0717     03/05/24 1400  ceFEPIme  (MAXIPIME ) 2 g in sodium chloride  0.9 % 100 mL IVPB        2 g 200 mL/hr over 30 Minutes Intravenous Every 8 hours 03/05/24 0717     03/05/24 0600  ceFEPIme  (MAXIPIME ) 2 g in sodium chloride  0.9 % 100 mL IVPB  Status:  Discontinued        2 g 200 mL/hr over 30 Minutes Intravenous Every 12 hours 03/04/24 2205 03/05/24 0717   03/05/24 0000  metroNIDAZOLE  (FLAGYL ) IVPB 500 mg  Status:  Discontinued        500 mg 100 mL/hr over 60 Minutes Intravenous Every 12 hours 03/04/24 2206 03/04/24 2209   03/04/24 1730  aztreonam (AZACTAM) 2 g in sodium chloride  0.9 % 100 mL IVPB  Status:  Discontinued        2 g 200 mL/hr over 30 Minutes Intravenous  Once 03/04/24 1715 03/04/24 1724   03/04/24 1730  metroNIDAZOLE  (FLAGYL ) IVPB 500 mg  Status:  Discontinued        500 mg 100 mL/hr over 60 Minutes Intravenous Every 12 hours 03/04/24 1715 03/07/24 0840   03/04/24 1730  vancomycin  (VANCOREADY) IVPB 1500 mg/300 mL        1,500 mg 150 mL/hr over 120 Minutes Intravenous  Once 03/04/24 1715 03/04/24 2153   03/04/24 1730  ceFEPIme  (MAXIPIME ) 2 g in sodium chloride  0.9 % 100 mL IVPB        2 g 200 mL/hr over 30 Minutes Intravenous  Once 03/04/24  1724 03/04/24 1826       Subjective: Patient seen and examined at bedside today.  He appears comfortable this morning.  Alert ,oriented.  Lying on bed.  Weak and deconditioned but overall comfortable.  Not in any kind of distress.  EKG monitor showed normal sinus rhythm.  Daughter at bedside.  Blood pressure better  Objective: Vitals:   03/08/24 2030 03/09/24 0018 03/09/24 0450 03/09/24 0842  BP: 133/72 (!) 135/58 (!) 125/57 124/66  Pulse: 93 84 85   Resp: (!) 23 (!) 25 19 (!) 23  Temp: 99.1 F (37.3 C) 98.4 F (36.9 C) 98.4 F (36.9 C) 98.8 F (37.1 C)  TempSrc: Oral Oral Oral Oral  SpO2: 97% 99% 96%   Weight:      Height:        Intake/Output Summary (Last 24 hours) at 03/09/2024 1129 Last data filed at 03/09/2024 1110 Gross per 24 hour  Intake 1681.49 ml  Output 1600 ml  Net 81.49 ml   Filed Weights   03/04/24 1656  Weight: 68.5  kg    Examination:  General exam: Overall comfortable, not in distress, lying in bed, deconditioned, chronically ill looking HEENT: PERRL Respiratory system:  no wheezes or crackles, diminished sounds in bases Cardiovascular system: S1 & S2 heard, RRR.  Gastrointestinal system: Abdomen is nondistended, soft and nontender. Central nervous system: Alert and oriented Extremities: No edema, no clubbing ,no cyanosis Skin: Scattered pressure ulcers, sacral pressure ulcer   Data Reviewed: I have personally reviewed following labs and imaging studies  CBC: Recent Labs  Lab 03/04/24 1706 03/05/24 0446 03/06/24 0501 03/07/24 0633 03/08/24 0445 03/09/24 0832  WBC 13.3* 10.7* 10.6* 11.1* 12.5* 10.2  NEUTROABS 10.8*  --   --   --   --   --   HGB 10.8* 9.0* 9.9* 9.4* 9.5* 9.3*  HCT 30.8* 25.3* 28.5* 27.3* 27.4* 26.4*  MCV 84.2 84.1 84.1 84.3 83.8 83.3  PLT 378 270 340 297 302 272   Basic Metabolic Panel: Recent Labs  Lab 03/04/24 1706 03/05/24 0446 03/05/24 1412 03/06/24 0501 03/07/24 0633 03/08/24 0445 03/09/24 0832  NA 130*  135  --  136  --  134*  --   K 5.0 4.2  --  4.1  --  3.7  --   CL 100 106  --  109  --  101  --   CO2 21* 23  --  20*  --  21*  --   GLUCOSE 500* 416*  --  166*  --  160*  --   BUN 28* 18  --  16  --  16  --   CREATININE 1.02 0.82  --  0.87  --  0.86  --   CALCIUM  7.8* 7.9*  --  7.8*  --  7.4*  --   MG  --   --  1.7 1.7  --   --   --   PHOS  --   --   --  2.1* 2.4* 2.1* 2.3*     Recent Results (from the past 240 hours)  Resp panel by RT-PCR (RSV, Flu A&B, Covid) Anterior Nasal Swab     Status: Abnormal   Collection Time: 03/04/24  5:38 PM   Specimen: Anterior Nasal Swab  Result Value Ref Range Status   SARS Coronavirus 2 by RT PCR POSITIVE (A) NEGATIVE Final   Influenza A by PCR NEGATIVE NEGATIVE Final   Influenza B by PCR NEGATIVE NEGATIVE Final    Comment: (NOTE) The Xpert Xpress SARS-CoV-2/FLU/RSV plus assay is intended as an aid in the diagnosis of influenza from Nasopharyngeal swab specimens and should not be used as a sole basis for treatment. Nasal washings and aspirates are unacceptable for Xpert Xpress SARS-CoV-2/FLU/RSV testing.  Fact Sheet for Patients: BloggerCourse.com  Fact Sheet for Healthcare Providers: SeriousBroker.it  This test is not yet approved or cleared by the [place]  FDA and has been authorized for detection and/or diagnosis of SARS-CoV-2 by FDA under an Emergency Use Authorization (EUA). This EUA will remain in effect (meaning this test can be used) for the duration of the COVID-19 declaration under Section 564(b)(1) of the Act, 21 U.S.C. section 360bbb-3(b)(1), unless the authorization is terminated or revoked.     Resp Syncytial Virus by PCR NEGATIVE NEGATIVE Final    Comment: (NOTE) Fact Sheet for Patients: BloggerCourse.com  Fact Sheet for Healthcare Providers: SeriousBroker.it  This test is not yet approved or cleared by the [place] FDA and has been authorized for detection and/or diagnosis of SARS-CoV-2 by FDA under  an Emergency Use Authorization (EUA). This EUA will remain in effect (meaning this test can be used) for the duration of the COVID-19 declaration under Section 564(b)(1) of the Act, 21 U.S.C. section 360bbb-3(b)(1), unless the authorization is terminated or revoked.  Performed at [facility], [address], [place], [place] [address]   Culture, blood (x 2)     Status: None   Collection Time: 03/04/24  5:44 PM   Specimen: BLOOD LEFT HAND  Result Value Ref Range Status   Specimen Description BLOOD LEFT HAND  Final   Special Requests   Final    BOTTLES DRAWN AEROBIC ONLY Blood Culture adequate volume   Culture   Final    NO GROWTH 5 DAYS Performed at [facility], [address], [place], [place] [address]    Report Status 03/09/2024 FINAL  Final  Culture, blood (x 2)     Status: None   Collection Time: 03/04/24  6:14 PM   Specimen: BLOOD RIGHT HAND  Result Value Ref Range Status   Specimen Description BLOOD RIGHT HAND  Final   Special Requests   Final    BOTTLES DRAWN AEROBIC ONLY Blood Culture results may not be optimal due to an inadequate volume of blood received in culture bottles   Culture   Final    NO GROWTH 5 DAYS Performed at [facility], [address], [place], [place] [address]    Report Status 03/09/2024 FINAL  Final     Radiology Studies: No results found.   Scheduled Meds:  amiodarone   200 mg Oral BID   Followed by   Cecily Cohen ON 03/17/2024] amiodarone   200 mg Oral Daily   ascorbic acid   500 mg Oral BID   atorvastatin   40 mg Oral Daily   Chlorhexidine  Gluconate Cloth  6 each Topical Daily   [START ON 03/10/2024] collagenase    Topical Daily   feeding supplement (GLUCERNA SHAKE)  237 mL Oral TID BM   ferrous sulfate   325 mg Oral QHS   insulin  aspart  0-15 Units Subcutaneous TID WC   insulin  aspart  0-5 Units Subcutaneous QHS   insulin  aspart  3  Units Subcutaneous TID WC   insulin  glargine-yfgn  12 Units Subcutaneous Daily   leptospermum manuka honey  1 Application Topical Daily   levothyroxine   50 mcg Oral Q0600   liver oil-zinc  oxide   Topical BID   metroNIDAZOLE   500 mg Oral Q12H   midodrine   5 mg Oral TID WC   phosphorus  500 mg Oral TID   polyethylene glycol  17 g Oral Daily   senna-docusate  1 tablet Oral BID   sodium chloride  flush  3 mL Intravenous Q12H   sodium hypochlorite   Irrigation BID   Tafamidis   1 capsule Oral Daily   zinc  sulfate (50mg  elemental zinc )  220 mg Oral Daily   Continuous Infusions:  ceFEPime  (MAXIPIME ) IV Stopped (03/09/24 6962)   heparin  1,100 Units/hr (03/09/24 0654)   vancomycin  150 mL/hr at 03/09/24 0654     LOS: 5 days   Leona Rake, MD [facility] P4/19/2025, 11:29 AM

## 2024-03-09 NOTE — Progress Notes (Signed)
 PHARMACY - ANTICOAGULATION CONSULT NOTE  Pharmacy Consult for heparin   Indication: atrial fibrillation  Allergies  Allergen Reactions   Penicillins Other (See Comments)    Did it involve swelling of the face/tongue/throat, SOB, or low BP? Unknown Did it involve sudden or severe rash/hives, skin peeling, or any reaction on the inside of your mouth or nose? Unknown Did you need to seek medical attention at a hospital or doctor's office? No When did it last happen?    Over 64 Years Ago   If all above answers are "NO", may proceed with cephalosporin use.      Patient Measurements: Height: 5\' 11"  (180.3 cm) Weight: 68.5 kg (151 lb) IBW/kg (Calculated) : 75.3 HEPARIN  DW (KG): 68.5  Vital Signs: Temp: 98.8 F (37.1 C) (04/19 0842) Temp Source: Oral (04/19 0842) BP: 124/66 (04/19 0842) Pulse Rate: 85 (04/19 0450)  Labs: Recent Labs    03/07/24 1610 03/08/24 0445 03/09/24 0832  HGB 9.4* 9.5* 9.3*  HCT 27.3* 27.4* 26.4*  PLT 297 302 272  APTT 76* 65* 45*  HEPARINUNFRC 0.58 0.45 0.18*  CREATININE  --  0.86  --     Estimated Creatinine Clearance: 64.2 mL/min (by C-G formula based on SCr of 0.86 mg/dL).   Medical History: Past Medical History:  Diagnosis Date   Adenomatous colon polyp    Allergy    Anal fissure    Arthritis    BPH (benign prostatic hyperplasia)    Diabetes mellitus (HCC)    GERD (gastroesophageal reflux disease)    Hyperlipidemia    Hypertension    Hypothyroidism    Other testicular hypofunction    Stroke [facility])    Assessment: Patient admitted with CC of afib with RVR + potential sepsis and hypotension due to UTI vs. Sacral wound. Patient on Eliquis  PTA, held for potential surgical intervention on sacral ulcer. Last dose of Eliquis  on 4/14 @ 08:00am. Pharmacy consulted to dose heparin .   4/19: Heparin  level subtherapeutic at 0.18 and aPTT subtherapeutic at 45 seconds while on 1100 units/hr. Noted that infusion to be increased yesterday, however  order was not placed. CBC stable. No issues with infusion and no new bleeding noted per RN. Heparin  levels now correlating with aPTT, so will discontinue monitoring with aPTT. Noted plans for possible operative debridement next week.  Goal of Therapy:  Heparin  level 0.3-0.7 units/ml Monitor platelets by anticoagulation protocol: Yes   Plan:  -Increase heparin  to 1300 units/hr -Heparin  level in 8 hours -Daily heparin  level and CBC  Juleen Oakland, PharmD PGY1 Pharmacy Resident 03/09/2024 10:48 AM

## 2024-03-09 NOTE — Plan of Care (Signed)
  Problem: Fluid Volume: Goal: Hemodynamic stability will improve Outcome: Progressing   Problem: Clinical Measurements: Goal: Diagnostic test results will improve Outcome: Progressing   Problem: Respiratory: Goal: Ability to maintain adequate ventilation will improve Outcome: Progressing   Problem: Respiratory: Goal: Will maintain a patent airway Outcome: Progressing

## 2024-03-10 DIAGNOSIS — I5022 Chronic systolic (congestive) heart failure: Secondary | ICD-10-CM | POA: Diagnosis not present

## 2024-03-10 DIAGNOSIS — E854 Organ-limited amyloidosis: Secondary | ICD-10-CM | POA: Diagnosis not present

## 2024-03-10 DIAGNOSIS — I43 Cardiomyopathy in diseases classified elsewhere: Secondary | ICD-10-CM | POA: Diagnosis not present

## 2024-03-10 DIAGNOSIS — Z515 Encounter for palliative care: Secondary | ICD-10-CM

## 2024-03-10 DIAGNOSIS — Z7189 Other specified counseling: Secondary | ICD-10-CM | POA: Diagnosis not present

## 2024-03-10 DIAGNOSIS — A419 Sepsis, unspecified organism: Secondary | ICD-10-CM | POA: Diagnosis not present

## 2024-03-10 DIAGNOSIS — I480 Paroxysmal atrial fibrillation: Secondary | ICD-10-CM | POA: Diagnosis not present

## 2024-03-10 DIAGNOSIS — R6520 Severe sepsis without septic shock: Secondary | ICD-10-CM | POA: Diagnosis not present

## 2024-03-10 LAB — CBC
HCT: 25 % — ABNORMAL LOW (ref 39.0–52.0)
Hemoglobin: 8.7 g/dL — ABNORMAL LOW (ref 13.0–17.0)
MCH: 29.1 pg (ref 26.0–34.0)
MCHC: 34.8 g/dL (ref 30.0–36.0)
MCV: 83.6 fL (ref 80.0–100.0)
Platelets: 265 10*3/uL (ref 150–400)
RBC: 2.99 MIL/uL — ABNORMAL LOW (ref 4.22–5.81)
RDW: 14.1 % (ref 11.5–15.5)
WBC: 12.2 10*3/uL — ABNORMAL HIGH (ref 4.0–10.5)
nRBC: 0 % (ref 0.0–0.2)

## 2024-03-10 LAB — BASIC METABOLIC PANEL WITH GFR
Anion gap: 7 (ref 5–15)
BUN: 16 mg/dL (ref 8–23)
CO2: 22 mmol/L (ref 22–32)
Calcium: 7.4 mg/dL — ABNORMAL LOW (ref 8.9–10.3)
Chloride: 105 mmol/L (ref 98–111)
Creatinine, Ser: 0.8 mg/dL (ref 0.61–1.24)
GFR, Estimated: >60 mL/min (ref >60)
Glucose, Bld: 149 mg/dL — ABNORMAL HIGH (ref 70–99)
Potassium: 4 mmol/L (ref 3.5–5.1)
Sodium: 134 mmol/L — ABNORMAL LOW (ref 135–145)

## 2024-03-10 LAB — HEPARIN LEVEL (UNFRACTIONATED)
Heparin Unfractionated: 0.16 [IU]/mL — ABNORMAL LOW (ref 0.30–0.70)
Heparin Unfractionated: 0.21 [IU]/mL — ABNORMAL LOW (ref 0.30–0.70)

## 2024-03-10 LAB — GLUCOSE, CAPILLARY
Glucose-Capillary: 152 mg/dL — ABNORMAL HIGH (ref 70–99)
Glucose-Capillary: 267 mg/dL — ABNORMAL HIGH (ref 70–99)
Glucose-Capillary: 278 mg/dL — ABNORMAL HIGH (ref 70–99)
Glucose-Capillary: 190 mg/dL — ABNORMAL HIGH (ref 70–99)

## 2024-03-10 LAB — PHOSPHORUS: Phosphorus: 2 mg/dL — ABNORMAL LOW (ref 2.5–4.6)

## 2024-03-10 LAB — VITAMIN A: Vitamin A (Retinoic Acid): 7.9 ug/dL — ABNORMAL LOW (ref 22.0–69.5)

## 2024-03-10 MED ORDER — CARMEX CLASSIC LIP BALM EX OINT
1.0000 | TOPICAL_OINTMENT | CUTANEOUS | Status: DC | PRN
  Filled 2024-03-10 (×2): qty 10

## 2024-03-10 MED ORDER — SODIUM CHLORIDE 0.9 % IV SOLN
INTRAVENOUS | Status: AC | PRN
Start: 2024-03-10 — End: 2024-03-11

## 2024-03-10 MED ORDER — POTASSIUM PHOSPHATES 15 MMOLE/5ML IV SOLN
30.0000 mmol | Freq: Once | INTRAVENOUS | Status: AC
  Administered 2024-03-10: 30 mmol via INTRAVENOUS
  Filled 2024-03-10: qty 10

## 2024-03-10 MED ORDER — ACETAMINOPHEN 500 MG PO TABS
500.0000 mg | ORAL_TABLET | Freq: Three times a day (TID) | ORAL | Status: DC
  Administered 2024-03-10 – 2024-05-17 (×185): 500 mg via ORAL
  Filled 2024-03-10 (×200): qty 1

## 2024-03-10 MED ORDER — INSULIN GLARGINE-YFGN 100 UNIT/ML ~~LOC~~ SOLN
15.0000 [IU] | Freq: Every day | SUBCUTANEOUS | Status: DC
  Administered 2024-03-10 – 2024-03-26 (×17): 15 [IU] via SUBCUTANEOUS
  Filled 2024-03-10 (×18): qty 0.15

## 2024-03-10 MED ORDER — BISACODYL 10 MG RE SUPP
10.0000 mg | Freq: Once | RECTAL | Status: AC
  Administered 2024-03-10: 10 mg via RECTAL
  Filled 2024-03-10: qty 1

## 2024-03-10 MED ORDER — FLEET ENEMA RE ENEM
1.0000 | ENEMA | Freq: Once | RECTAL | Status: AC
  Administered 2024-03-10: 1 via RECTAL
  Filled 2024-03-10: qty 1

## 2024-03-10 NOTE — Progress Notes (Signed)
 PHARMACY - ANTICOAGULATION CONSULT NOTE  Pharmacy Consult for heparin   Indication: atrial fibrillation  Allergies  Allergen Reactions   Penicillins Other (See Comments)    Did it involve swelling of the face/tongue/throat, SOB, or low BP? Unknown Did it involve sudden or severe rash/hives, skin peeling, or any reaction on the inside of your mouth or nose? Unknown Did you need to seek medical attention at a hospital or doctor's office? No When did it last happen?    Over 29 Years Ago   If all above answers are "NO", may proceed with cephalosporin use.      Patient Measurements: Height: 5\' 11"  (180.3 cm) Weight: 68.5 kg (151 lb) IBW/kg (Calculated) : 75.3 HEPARIN  DW (KG): 68.5  Vital Signs: Temp: 98.7 F (37.1 C) (04/20 0648) Temp Source: Oral (04/20 0648) BP: 117/58 (04/20 0648) Pulse Rate: 86 (04/20 0648)  Labs: Recent Labs    03/08/24 0445 03/09/24 0832 03/09/24 1844 03/10/24 0555  HGB 9.5* 9.3*  --  8.7*  HCT 27.4* 26.4*  --  25.0*  PLT 302 272  --  265  APTT 65* 45*  --   --   HEPARINUNFRC 0.45 0.18* 0.36 0.16*  CREATININE 0.86  --   --  0.80    Estimated Creatinine Clearance: 69 mL/min (by C-G formula based on SCr of 0.8 mg/dL).   Medical History: Past Medical History:  Diagnosis Date   Adenomatous colon polyp    Allergy    Anal fissure    Arthritis    BPH (benign prostatic hyperplasia)    Diabetes mellitus (HCC)    GERD (gastroesophageal reflux disease)    Hyperlipidemia    Hypertension    Hypothyroidism    Other testicular hypofunction    Stroke [facility])    Assessment: Patient admitted with CC of afib with RVR + potential sepsis and hypotension due to UTI vs. Sacral wound. Patient on Eliquis  PTA, held for potential surgical intervention on sacral ulcer. Last dose of Eliquis  on 4/14 @ 08:00am. Pharmacy consulted to dose heparin .   4/20 AM: Heparin  level this morning subtherapeutic at 0.16, on heparin  infusion at 1300 units/hr. Hgb 8.7, plt 265. No  s/sx of bleeding or infusion issues per RN.   Goal of Therapy:  Heparin  level 0.3-0.7 units/ml Monitor platelets by anticoagulation protocol: Yes   Plan:  -Increase heparin  infusion to 1500 units/hr -Heparin  level in 8 hours -Daily heparin  level and CBC  Thank you for allowing pharmacy to participate in this patient's care,  Juleen Oakland, PharmD PGY1 Pharmacy Resident 03/10/2024 7:41 AM  Please check AMION for all [facility] Pharmacy phone numbers After 10:00 PM, call Main Pharmacy [phone]

## 2024-03-10 NOTE — Progress Notes (Signed)
 PHARMACY - ANTICOAGULATION CONSULT NOTE  Pharmacy Consult for heparin   Indication: atrial fibrillation  Allergies  Allergen Reactions   Penicillins Other (See Comments)    Did it involve swelling of the face/tongue/throat, SOB, or low BP? Unknown Did it involve sudden or severe rash/hives, skin peeling, or any reaction on the inside of your mouth or nose? Unknown Did you need to seek medical attention at a hospital or doctor's office? No When did it last happen?    Over 68 Years Ago   If all above answers are "NO", may proceed with cephalosporin use.      Patient Measurements: Height: 5\' 11"  (180.3 cm) Weight: 68.5 kg (151 lb) IBW/kg (Calculated) : 75.3 HEPARIN  DW (KG): 68.5  Vital Signs: Temp: 98.4 F (36.9 C) (04/20 1322) Temp Source: Oral (04/20 1322) BP: 123/57 (04/20 1322) Pulse Rate: 86 (04/20 1322)  Labs: Recent Labs    03/08/24 0445 03/09/24 0832 03/09/24 1844 03/10/24 0555 03/10/24 1752  HGB 9.5* 9.3*  --  8.7*  --   HCT 27.4* 26.4*  --  25.0*  --   PLT 302 272  --  265  --   APTT 65* 45*  --   --   --   HEPARINUNFRC 0.45 0.18* 0.36 0.16* 0.21*  CREATININE 0.86  --   --  0.80  --     Estimated Creatinine Clearance: 69 mL/min (by C-G formula based on SCr of 0.8 mg/dL).   Medical History: Past Medical History:  Diagnosis Date   Adenomatous colon polyp    Allergy    Anal fissure    Arthritis    BPH (benign prostatic hyperplasia)    Diabetes mellitus (HCC)    GERD (gastroesophageal reflux disease)    Hyperlipidemia    Hypertension    Hypothyroidism    Other testicular hypofunction    Stroke [facility])    Assessment: Patient admitted with CC of afib with RVR + potential sepsis and hypotension due to UTI vs. Sacral wound. Patient on Eliquis  PTA, held for potential surgical intervention on sacral ulcer. Last dose of Eliquis  on 4/14 @ 08:00am. Pharmacy consulted to dose heparin .   4/20 AM: Heparin  level this morning subtherapeutic at 0.16, on heparin   infusion at 1300 units/hr. Hgb 8.7, plt 265. No s/sx of bleeding or infusion issues per RN.   4/21 PM update: HL 0.21 No signs of bleeding or issues w/ gtt per nursing  Goal of Therapy:  Heparin  level 0.3-0.7 units/ml Monitor platelets by anticoagulation protocol: Yes   Plan:  -Increase heparin  infusion to 1650 units/hr -Heparin  level in 8 hours -Daily heparin  level and CBC  Thank you for allowing pharmacy to participate in this patient's care,  Marleta Simmer BS, PharmD, BCPS Clinical Pharmacist 03/10/2024 7:08 PM  Contact: [phone] after 3 PM  "Be curious, not judgmental..." -Rumalda Counter

## 2024-03-10 NOTE — Progress Notes (Signed)
 Cardiology Progress Note  Patient ID: Earl [name] MRN: [id number] DOB: 09/10/1941 Date of Encounter: 03/10/2024 Primary Cardiologist: Richardo Chandler, MD  Subjective   Chief Complaint: Weakness/Fatigue  HPI: In NSR. Low energy per daughter.   ROS:  All other ROS reviewed and negative. Pertinent positives noted in the HPI.     Telemetry  Overnight telemetry shows SR 90s, which I personally reviewed.   Physical Exam   Vitals:   03/09/24 2004 03/09/24 2359 03/10/24 0648 03/10/24 0742  BP: (!) 128/57 (!) 130/58 (!) 117/58 128/67  Pulse: 83 81 86 93  Resp: 16 16 16 16   Temp: 99 F (37.2 C) 98.9 F (37.2 C) 98.7 F (37.1 C) 98.3 F (36.8 C)  TempSrc: Oral Oral Oral Oral  SpO2: 97% 99% 96% 96%  Weight:      Height:        Intake/Output Summary (Last 24 hours) at 03/10/2024 1119 Last data filed at 03/10/2024 0909 Gross per 24 hour  Intake 3209.05 ml  Output 3475 ml  Net -265.95 ml       03/04/2024    4:56 PM 02/14/2024    7:52 PM 01/24/2024   11:05 AM  Last 3 Weights  Weight (lbs) 151 lb 159 lb 159 lb  Weight (kg) 68.493 kg 72.122 kg 72.122 kg    Body mass index is 21.06 kg/m.  General: Ill-appearing Head: Atraumatic, normal size  Eyes: PEERLA, EOMI  Neck: Supple, no JVD Endocrine: No thryomegaly Cardiac: Normal S1, S2; RRR; no murmurs, rubs, or gallops Lungs: Diminished breath sounds Abd: Soft, nontender, no hepatomegaly  Ext: Trace edema in ankles Musculoskeletal: No deformities, BUE and BLE strength normal and equal Skin: Warm and dry, no rashes   Neuro: Alert and oriented to person, place, time, and situation, CNII-XII grossly intact, no focal deficits  Psych: Normal mood and affect   Cardiac Studies  TTE 09/20/2023  1. Left ventricular ejection fraction, by estimation, is 45 to 50%. The  left ventricle has mildly decreased function. The left ventricle  demonstrates global hypokinesis. There is moderate concentric left  ventricular hypertrophy. Left  ventricular  diastolic parameters are consistent with Grade II diastolic dysfunction  (pseudonormalization). The average left ventricular global longitudinal  strain is -12.7 %. The global longitudinal strain is abnormal.   2. Right ventricular systolic function is normal. The right ventricular  size is mildly enlarged. There is mildly elevated pulmonary artery  systolic pressure. The estimated right ventricular systolic pressure is  38.0 mmHg.   3. Left atrial size was mild to moderately dilated.   4. Right atrial size was mildly dilated.   5. The mitral valve is normal in structure. Mild mitral valve  regurgitation. No evidence of mitral stenosis.   6. The aortic valve is tricuspid. Aortic valve regurgitation is mild. No  aortic stenosis is present.   7. The inferior vena cava is normal in size with greater than 50%  respiratory variability, suggesting right atrial pressure of 3 mmHg.    Patient Profile  Earl [name] is a 83 y.o. male with transthyretin cardiac amyloidosis (hereditary), systolic heart failure EF 45 to 50%, hypertension, hyperlipidemia, paroxysmal A-fib, stroke with residual right-sided deficits, chronic pancytopenia admitted on 03/04/2024 with sepsis secondary to infected sacral decubitus ulcer.  Cardiology was consulted for A-fib management and hypotension.   Assessment & Plan   # Chronic systolic heart failure, EF 45-50% # Transthyretin cardiac amyloidosis, hereditary # Sepsis secondary to infected sacral decubitus ulcer - Blood pressures are stable  on midodrine .  Hold GDMT.  He is in need of surgery.  He has an advanced cardiomyopathy.  He has multiple comorbidities.  I agree with a conservative approach.  Hold metoprolol  digoxin  and other GDMT. - Continue midodrine  5 mg 3 times daily.  Blood pressure has improved with treatment of sepsis. - No signs of volume overload.  Can be given Lasix  20 mg p.o. as needed. - Continue home tafamidis . On amvuttra as well.    # Paroxysmal A-fib - Maintaining sinus rhythm on amiodarone .  Continue with 200 mg twice daily for 7 days followed by 200 mg daily. - On heparin  drip.  May need wound debridement.  Restart Eliquis  when you are able.  # Orthostatic hypotension - Midodrine  5 mg 3 times daily.   For questions or updates, please contact [facility] Please consult [email or web address] for contact info under         Signed, Melodee Spruce T. Rolm Clos, MD, [facility]  [facility]  03/10/2024 11:19 AM

## 2024-03-10 NOTE — Progress Notes (Signed)
 Speech Language Pathology Treatment: Dysphagia  Patient Details Name: Earl [name] MRN: [id number] DOB: September 14, 1941 Today's Date: 03/10/2024 Time: 4098-1191 SLP Time Calculation (min) (ACUTE ONLY): 12 min  Assessment / Plan / Recommendation Clinical Impression  Patient seen by SLP for skilled treatment focused on dysphagia goals. Daughter was in the room as was RN to give patient some medications. Daughter reports that patient has been doing well overall with soft foods that require minimal to no chewing but his PO intake overall has been minimal. Daughter has been very cautiously feeding patient and ordering or preparing foods so they are either pureed or minced. Patient appears more awake and alert, more interactive. SLP observed him with PO intake of straw sips of thin liquids as well as pills in applesauce. He did exhibit two instances of delayed cough after PO intake which is consistent with his h/o oropharyngeal dysphagia and GERD. Daughter's primary concern at this time is getting enough hydration/fluids in him as well as enough PO nutrition to promote wound healing, etc. SLP recommending to continue with current diet as his daughter is doing an excellent job of ordering appropriate food textures, preparing foods to be more easily and safely consumed and carefully feeding patient, checking for oral residuals, etc. SLP will continue to follow.   HPI HPI: Patient is an 83 y.o. 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 He was discharged from the hospital (admitted for acute metabolic encephalopathy, hypotensive, hypoglycemic) four days ago to [facility] for rehab. He presented back to the hospital on 03/04/24 with SOB and pain in sacral area. 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 SLP ordered due to suspicious for dysphagia.      SLP Plan  Continue with current plan of care      Recommendations for follow up therapy are one component of a multi-disciplinary discharge planning process, led by the attending physician.  Recommendations may be updated based on patient status, additional functional criteria and insurance authorization.    Recommendations  Diet recommendations: Regular;Thin liquid Liquids provided via: Cup;Straw Medication Administration: Crushed with puree Supervision: Trained caregiver to feed patient;Full supervision/cueing for compensatory strategies Compensations: Slow rate;Small sips/bites Postural Changes and/or Swallow Maneuvers: Seated upright 90 degrees;Upright 30-60 min after meal                  Oral care BID   Frequent or constant Supervision/Assistance Dysphagia, unspecified (R13.10)     Continue with current plan of care     Jacqualine Mater, MA, CCC-SLP Speech Therapy

## 2024-03-10 NOTE — Consult Note (Addendum)
 Palliative Medicine Inpatient Consult Note  Consulting Provider: Leona Rake, MD   Reason for consult:   Palliative Care Consult Services Palliative Medicine Consult  Reason for Consult? Very poor quality of life, bed bound, pressure ulcers. Persistent afib with rvr   03/10/2024  HPI:  Per intake H&P --> Patient is a 83 year old male with history of paroxysmal A-fib, insulin -dependent diabetes, hypertension, hyperlipidemia, CVA with residual right-sided weakness, MGUS, GERD, BPH, hypothyroidism, HFrEF with EF of 40 to 45%, chronic pancytopenia who presented to the emergency department from SNF with complaint of shortness of breath, wound, pain in the sacral area.   Palliative care evaluated Earl [name] earlier this month and have been asked to get re-involved for additional goals of care conversations.   Clinical Assessment/Goals of Care:  *Please note that this is a verbal dictation therefore any spelling or grammatical errors are due to the "Dragon Medical One" system interpretation.  I have reviewed medical records including EPIC notes, labs and imaging, received report from bedside RN, assessed the patient who is lying in bed sleepy but arousable.    I met with Earl [name] and his daughter, Earl [name] to further discuss diagnosis prognosis, GOC, EOL wishes, disposition and options.   I introduced Palliative Medicine as specialized medical care for people living with serious illness. It focuses on providing relief from the symptoms and stress of a serious illness. The goal is to improve quality of life for both the patient and the family.  Medical History Review and Understanding:  A review of Earl [name]'s past medical history significant for paroxysmal A-fib, insulin -dependent diabetes, hypertension, hyperlipidemia, CVA with residual right-sided weakness, MGUS, GERD, BPH, hypothyroidism, & HFrEF was completed.  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 He  is a widow. He has a son and daughter, He has two grandchildren and one great grandchild. He formerly worked as a Psychologist, prison and probation services for a [employer] He is a man of the [employer].   Functional and Nutritional State:  Prior to last hospitalization and six day stay at [facility], Earl [name] was living at home with a caregiver 4 days a week and frequent check ins from his daughter and son. He used a FW walker and was eating fairly until afflicted by Covid.   Advance Directives:  A detailed discussion was had today regarding advanced directives.  Patients daughter and son are his surrogate decision makers.  Code Status:  Concepts specific to code status, artifical feeding and hydration, continued IV antibiotics and rehospitalization was had.  The difference between a aggressive medical intervention path  and a palliative comfort care path for this patient at this time was had.   Earl [name] is an established DNAR/DNI code status.    Discussion:  We reviewed Earl [name]'s recent hospitalization in the setting of COVID. Discussions related to how weak this made him were held. It was during his previous hospital stay that his daughter shares his pressure injury started to develop. She feels that the staff lacked in attentiveness towards this. He then transitioned to [facility], what is thought to be prematurely. While there he was able to sit upright albeit with pain, though able. He experienced worsening symptoms at [facility] leading to acute hospitalization.  Since being at [facility] hs been treated for sepsis, had a bedside debridement, and had his Afib evaluated and treated by the cardiology team.  Discussed the possible outcomes, best case and worst case with patients daughter. She shares understanding of the magnitude of patients present  condition. The hope it to support additional pain and mitigate the risks of sedation.   Patients daughter notes the ongoing hope for  improvements and she feels he was fairly well functioning preceding his prior hospitalization.   Patients daughter is aware of the need for ongoing nutritional support, PT, and OT. She shares thew hope for improvement from the infection stand  Discussed the importance of continued conversation with family and their  medical providers regarding overall plan of care and treatment options, ensuring decisions are within the context of the patients values and GOCs.  Decision Maker: Earl [name], Earl [name] (Daughter): [phone] (Mobile)   SUMMARY OF RECOMMENDATIONS   DNAR/DNI  Continue present care, allowing time for outcomes  Patients daughter aware of his present ailments and potential to worsen  Pain support through Tylenol , Oxycodone , and dilaudid   Generalized weakness supported by PT/OT  Will reach out to  [facility] as per daughter there was no follow up last discharge  Ongoing PMT support as needed  Code Status/Advance Care Planning: DNAR/DNI   Symptom Management:   Palliative Prophylaxis:  Aspiration, Bowel Regimen, Delirium Protocol, Frequent Pain Assessment, Oral Care, Palliative Wound Care, and Turn Reposition  Additional Recommendations (Limitations, Scope, Preferences): Continue current care  Psycho-social/Spiritual:  Desire for further Chaplaincy support: Yes Additional Recommendations: Education on sacral wound and progression   Prognosis: Unclear at this time though sacral wound has caused sepsis and Earl [name] has multiple co-morbid conditions which place him at a higher 12 month mortality risk.   Discharge Planning: Likely skilled nursing.  Vitals:   03/10/24 0648 03/10/24 0742  BP: (!) 117/58 128/67  Pulse: 86 93  Resp: 16 16  Temp: 98.7 F (37.1 C) 98.3 F (36.8 C)  SpO2: 96% 96%    Intake/Output Summary (Last 24 hours) at 03/10/2024 0805 Last data filed at 03/10/2024 0802 Gross per 24 hour  Intake 3449.05 ml  Output 4075 ml  Net -625.95 ml   Last  Weight  Most recent update: 03/04/2024  4:56 PM    Weight  68.5 kg (151 lb)            Gen:  Frail elderly AA gentleman in NAD HEENT: moist mucous membranes CV: Regular rate and rhythm  PULM:  On RA, breathing is even and nonlabored ABD: soft/nontender  EXT: BUE edema Neuro: Alert and oriented x3   PPS: 30%   This conversation/these recommendations were discussed with patient primary care team, Dr. Murray Arnold  Total Time: 24 Billing based on MDM: High ______________________________________________________ [facility] Palliative Medicine Team Team Cell Phone: [phone] Please utilize secure chat with additional questions, if there is no response within 30 minutes please call the above phone number  Palliative Medicine Team providers are available by phone from 7am to 7pm daily and can be reached through the team cell phone.  Should this patient require assistance outside of these hours, please call the patient's attending physician.

## 2024-03-10 NOTE — Progress Notes (Signed)
 PROGRESS NOTE  Earl [name]  ZOX:[id number] DOB: 1941/04/23 DOA: 03/04/2024 PCP: [name], MD   Brief Narrative: Patient is a 83 year old male with history of paroxysmal A-fib, insulin -dependent diabetes, hypertension, hyperlipidemia, CVA with residual right-sided weakness, MGUS, GERD, BPH, hypothyroidism, HFrEF with EF of 40 to 45%, chronic pancytopenia who presented to the emergency department from SNF with complaint of shortness of breath, wound, pain in the sacral area.  He was just discharged from here to [facility] rehab 4 days ago.  Hospital course was remarkable for acute metabolic encephalopathy, hypotension, hypoglycemia.  On presentation, he was hypotensive, EKG showed A-fib with RVR, tachypnea but saturating fine on room air.  Respiratory panel negative for COVID.  PCCM consulted for persistent hypotension.  Cardiology also consulted.  General surgery consulted for sacral ulcer.  Started on broad-spectrum antibiotics, amiodarone  drip.  Status post bedside excisional debridement of the sacral wound on 4/15 and 4/17.  Wound care, cardiology, general surgery following.  Assessment & Plan:  Principal Problem:   Severe sepsis (HCC) Active Problems:   Paroxysmal atrial fibrillation (HCC)   Sepsis (HCC)   Atrial flutter (HCC)   Sacral ulcer (HCC)   MGUS (monoclonal gammopathy of unknown significance)   Essential hypertension   Hyperlipidemia   Insulin  dependent type 2 diabetes mellitus (HCC)   History of CVA (cerebrovascular accident)   Cardiac amyloidosis (HCC)   Chronic systolic CHF (congestive heart failure) (HCC)   GERD (gastroesophageal reflux disease)   History of hypertension   Atrial fibrillation with RVR (HCC)  Sepsis secondary to sacral pressure wound/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  Hypotensive, encephalopathic, hypoglycemic, elevated lactate,  leukocytosis on presentation.  Started on IV fluid, broad spectrum antibiotics.  Follow-up cultures:NGTD Code sepsis was activated.  PCCM recommended to continue midodrine . Blood pressure has been better now with systolic in the range of 100s.  Currently he is alert and mostly oriented.  Chronic sacral wound with suspicion of infection: CT chest/abdomen/pelvis showed sacral decubitus wound with extension to the right gluteus muscle, no definitive bony lesions,no signs of osteomyelitis.  General surgery consulted,   Continue current antibiotics.  PT consulted for hydrotherapy.  Continue zinc , vitamin C .Status post bedside excisional debridement of the sacral wound on 4/15 and 4/17.  Observed to have purulent drainage.  Surgery recommended to continue twice daily wet-to-dry dressing changes with Dakin's.  General surgery planning to see him again on Monday and consider operative debridement.  Hopefully wound cultures will be obtained at the time.  We may need ID consultation at some point. Wound care nurse following for other pressure ulcers  History of paroxysmal A-fib: Presented with A-fib with RVR.  Toprol  was discontinued on last admission.  Started on amiodarone  drip.  Cardiology following.  Eliquis  on hold for possible need of debridement, on heparin  drip.  He remains in normal sinus rhythm with controlled heart rate today.  Digoxin  discontinued.  Amiodarone  changed to oral  History of HFrEF/transthyretin cardiac amyloidosis/nonischemic cardiomyopathy/hypertension: Presented with hypotension.  Takes Entresto , Toprol  at home,. Currently on hold.  Given IV fluids initially.    Cardiology team was following,continue midodrine   Insulin -dependent diabetes type 2: Presented with hyperglycemia.  Recent A1c in the range of 8.  Diabetic coordinator consulted.  Currently on sliding scale.  Hyperglycemic.  Started on Lantus  12 units, sliding scale, meal coverage  History of CVA: Has a right-sided residual  weakness.  On Eliquis , Lipitor.PT consulted  GERD: Continue Protonix  Anemia of chronic disease: Continue iron supplementation  Hypothyroidism: Continue levothyroxine   Recent history of COVID: COVID was positive on 02/14/2024.  Still positive.  Out of 21 days window, do not think he needs contact/airborne precaution  Severe protein calorie malnutrition/suspicion for dysphagia/hypophosphatemia: Speech therapy consulted.  Phosphorus being  supplemented.  Dietitian consulted.  Constipation: Continue bowel regimen  Goals of care: Very deconditioned elderly male with bedbound status, multiple comorbidities, sacral pressure ulcer.  CODE STATUS DNR.  Palliative care consulted for goals of care.  Daughter receptive for goals of care discussion    Nutrition Problem: Inadequate oral intake Etiology: decreased appetite Pressure Injury 02/26/24 Coccyx Mid Unstageable - Full thickness tissue loss in which the base of the injury is covered by slough (yellow, tan, gray, green or brown) and/or eschar (tan, brown or black) in the wound bed. (Active)  02/26/24 0916  Location: Coccyx  Location Orientation: Mid  Staging: Unstageable - Full thickness tissue loss in which the base of the injury is covered by slough (yellow, tan, gray, green or brown) and/or eschar (tan, brown or black) in the wound bed.  Wound Description (Comments):   Present on Admission: No  Dressing Type Foam - Lift dressing to assess site every shift;Santyl ;Gauze (Comment) 03/10/24 0615     Pressure Injury 02/26/24 Toe (Comment  which one) Anterior;Right Deep Tissue Pressure Injury - Purple or maroon localized area of discolored intact skin or blood-filled blister due to damage of underlying soft tissue from pressure and/or shear. right gre (Active)  02/26/24 1215  Location: Toe (Comment  which one)  Location Orientation: Anterior;Right  Staging: Deep Tissue Pressure Injury - Purple or maroon localized area of discolored intact skin  or blood-filled blister due to damage of underlying soft tissue from pressure and/or shear.  Wound Description (Comments): right great toe  Present on Admission:   Dressing Type Silicone dressing 03/08/24 2140     Pressure Injury 03/06/24 Hip Anterior;Left;Proximal Deep Tissue Pressure Injury - Purple or maroon localized area of discolored intact skin or blood-filled blister due to damage of underlying soft tissue from pressure and/or shear. Fluid filled blister w (Active)  03/06/24 1612  Location: Hip  Location Orientation: Anterior;Left;Proximal  Staging: Deep Tissue Pressure Injury - Purple or maroon localized area of discolored intact skin or blood-filled blister due to damage of underlying soft tissue from pressure and/or shear.  Wound Description (Comments): Fluid filled blister with purple discoloration  Present on Admission: No  Dressing Type Foam - Lift dressing to assess site every shift;Impregnated gauze (petrolatum) 03/10/24 0615     Pressure Injury 03/06/24 Shoulder Left Deep Tissue Pressure Injury - Purple or maroon localized area of discolored intact skin or blood-filled blister due to damage of underlying soft tissue from pressure and/or shear. dark purple discoloration to should (Active)  03/06/24 1613  Location: Shoulder  Location Orientation: Left  Staging: Deep Tissue Pressure Injury - Purple or maroon localized area of discolored intact skin or blood-filled blister due to damage of underlying soft tissue from pressure and/or shear.  Wound Description (Comments): dark purple discoloration to shoulder  Present on Admission: No  Dressing Type Foam - Lift dressing to assess site every shift;Impregnated gauze (petrolatum) 03/10/24 0615    DVT prophylaxis:SCDs Start: 03/04/24 2151 Place TED hose Start: 03/04/24 2151     Code Status: Limited: Do not attempt resuscitation (DNR) -DNR-LIMITED -Do Not Intubate/DNI   Family Communication: Daughter at bedside on 4/20  Patient  status:Inpatient  Patient is  from :home  Anticipated discharge to:not sure  Estimated DC date:not sure   Consultants:Cardiology,pccm   Procedures:None yet  Antimicrobials:  Anti-infectives (From admission, onward)    Start     Dose/Rate Route Frequency Ordered Stop   03/07/24 2200  metroNIDAZOLE  (FLAGYL ) tablet 500 mg        500 mg Oral Every 12 hours 03/07/24 0840 03/12/24 0959   03/05/24 1930  vancomycin  (VANCOREADY) IVPB 1250 mg/250 mL  Status:  Discontinued        1,250 mg 166.7 mL/hr over 90 Minutes Intravenous Every 24 hours 03/04/24 2205 03/04/24 2224   03/05/24 1930  Vancomycin  (VANCOCIN ) 1,250 mg in sodium chloride  0.9 % 250 mL IVPB  Status:  Discontinued        1,250 mg 166.7 mL/hr over 90 Minutes Intravenous Every 24 hours 03/04/24 2224 03/05/24 0717   03/05/24 1930  vancomycin  (VANCOREADY) IVPB 1500 mg/300 mL        1,500 mg 150 mL/hr over 120 Minutes Intravenous Every 24 hours 03/05/24 0717     03/05/24 1400  ceFEPIme  (MAXIPIME ) 2 g in sodium chloride  0.9 % 100 mL IVPB        2 g 200 mL/hr over 30 Minutes Intravenous Every 8 hours 03/05/24 0717     03/05/24 0600  ceFEPIme  (MAXIPIME ) 2 g in sodium chloride  0.9 % 100 mL IVPB  Status:  Discontinued        2 g 200 mL/hr over 30 Minutes Intravenous Every 12 hours 03/04/24 2205 03/05/24 0717   03/05/24 0000  metroNIDAZOLE  (FLAGYL ) IVPB 500 mg  Status:  Discontinued        500 mg 100 mL/hr over 60 Minutes Intravenous Every 12 hours 03/04/24 2206 03/04/24 2209   03/04/24 1730  aztreonam (AZACTAM) 2 g in sodium chloride  0.9 % 100 mL IVPB  Status:  Discontinued        2 g 200 mL/hr over 30 Minutes Intravenous  Once 03/04/24 1715 03/04/24 1724   03/04/24 1730  metroNIDAZOLE  (FLAGYL ) IVPB 500 mg  Status:  Discontinued        500 mg 100 mL/hr over 60 Minutes Intravenous Every 12 hours 03/04/24 1715 03/07/24 0840   03/04/24 1730  vancomycin  (VANCOREADY) IVPB 1500 mg/300 mL        1,500 mg 150 mL/hr over 120 Minutes  Intravenous  Once 03/04/24 1715 03/04/24 2153   03/04/24 1730  ceFEPIme  (MAXIPIME ) 2 g in sodium chloride  0.9 % 100 mL IVPB        2 g 200 mL/hr over 30 Minutes Intravenous  Once 03/04/24 1724 03/04/24 1826       Subjective: Patient seen and examined at bedside today.  Hemodynamically stable.  Currently in normal sinus rhythm.  Stable blood pressure.  Given Dilaudid  this morning and during my evaluation he was somnolent.  Not in any kind of distress.  Daughter concerned  about poor oral intake, and not moving out of bed.  PT consulted  Objective: Vitals:   03/09/24 2004 03/09/24 2359 03/10/24 0648 03/10/24 0742  BP: (!) 128/57 (!) 130/58 (!) 117/58 128/67  Pulse: 83 81 86 93  Resp: 16 16 16 16   Temp: 99 F (37.2 C) 98.9 F (37.2 C) 98.7 F (37.1 C) 98.3 F (36.8 C)  TempSrc: Oral Oral Oral Oral  SpO2: 97% 99% 96% 96%  Weight:      Height:        Intake/Output Summary (Last 24 hours) at 03/10/2024 1121 Last data filed at  03/10/2024 0909 Gross per 24 hour  Intake 3209.05 ml  Output 3475 ml  Net -265.95 ml   Filed Weights   03/04/24 1656  Weight: 68.5 kg    Examination: General exam: Overall comfortable, not in distress,lying on bed,sleepy, chronically deconditioned, chronically ill looking HEENT: PERRL Respiratory system:  no wheezes or crackles , on room air Cardiovascular system: S1 & S2 heard, RRR.  Gastrointestinal system: Abdomen is nondistended, soft and nontender. Central nervous system: Alert and oriented Extremities: No edema, no clubbing ,no cyanosis Skin: Scattered pressure ulcers, sacral pressure ulcer   Data Reviewed: I have personally reviewed following labs and imaging studies  CBC: Recent Labs  Lab 03/04/24 1706 03/05/24 0446 03/06/24 0501 03/07/24 0633 03/08/24 0445 03/09/24 0832 03/10/24 0555  WBC 13.3*   < > 10.6* 11.1* 12.5* 10.2 12.2*  NEUTROABS 10.8*  --   --   --   --   --   --   HGB 10.8*   < > 9.9* 9.4* 9.5* 9.3* 8.7*  HCT 30.8*    < > 28.5* 27.3* 27.4* 26.4* 25.0*  MCV 84.2   < > 84.1 84.3 83.8 83.3 83.6  PLT 378   < > 340 297 302 272 265   < > = values in this interval not displayed.   Basic Metabolic Panel: Recent Labs  Lab 03/04/24 1706 03/05/24 0446 03/05/24 1412 03/06/24 0501 03/07/24 1610 03/08/24 0445 03/09/24 0832 03/10/24 0555  NA 130* 135  --  136  --  134*  --  134*  K 5.0 4.2  --  4.1  --  3.7  --  4.0  CL 100 106  --  109  --  101  --  105  CO2 21* 23  --  20*  --  21*  --  22  GLUCOSE 500* 416*  --  166*  --  160*  --  149*  BUN 28* 18  --  16  --  16  --  16  CREATININE 1.02 0.82  --  0.87  --  0.86  --  0.80  CALCIUM  7.8* 7.9*  --  7.8*  --  7.4*  --  7.4*  MG  --   --  1.7 1.7  --   --   --   --   PHOS  --   --   --  2.1* 2.4* 2.1* 2.3* 2.0*     Recent Results (from the past 240 hours)  Resp panel by RT-PCR (RSV, Flu A&B, Covid) Anterior Nasal Swab     Status: Abnormal   Collection Time: 03/04/24  5:38 PM   Specimen: Anterior Nasal Swab  Result Value Ref Range Status   SARS Coronavirus 2 by RT PCR POSITIVE (A) NEGATIVE Final   Influenza A by PCR NEGATIVE NEGATIVE Final   Influenza B by PCR NEGATIVE NEGATIVE Final    Comment: (NOTE) The Xpert Xpress SARS-CoV-2/FLU/RSV plus assay is intended as an aid in the diagnosis of influenza from Nasopharyngeal swab specimens and should not be used as a sole basis for treatment. Nasal washings and aspirates are unacceptable for Xpert Xpress SARS-CoV-2/FLU/RSV testing.  Fact Sheet for Patients: BloggerCourse.com  Fact Sheet for Healthcare Providers: SeriousBroker.it  This test is not yet approved or cleared by the [place]  FDA and has been authorized for detection and/or diagnosis of SARS-CoV-2 by FDA under an Emergency Use Authorization (EUA). This EUA will remain in effect (meaning this test can be used) for the duration  of the COVID-19 declaration under Section 564(b)(1) of the  Act, 21 U.S.C. section 360bbb-3(b)(1), unless the authorization is terminated or revoked.     Resp Syncytial Virus by PCR NEGATIVE NEGATIVE Final    Comment: (NOTE) Fact Sheet for Patients: BloggerCourse.com  Fact Sheet for Healthcare Providers: SeriousBroker.it  This test is not yet approved or cleared by the [place]  FDA and has been authorized for detection and/or diagnosis of SARS-CoV-2 by FDA under an Emergency Use Authorization (EUA). This EUA will remain in effect (meaning this test can be used) for the duration of the COVID-19 declaration under Section 564(b)(1) of the Act, 21 U.S.C. section 360bbb-3(b)(1), unless the authorization is terminated or revoked.  Performed at [facility], [address], [place], [place] [address]   Culture, blood (x 2)     Status: None   Collection Time: 03/04/24  5:44 PM   Specimen: BLOOD LEFT HAND  Result Value Ref Range Status   Specimen Description BLOOD LEFT HAND  Final   Special Requests   Final    BOTTLES DRAWN AEROBIC ONLY Blood Culture adequate volume   Culture   Final    NO GROWTH 5 DAYS Performed at [facility], [address], [place], [place] [address]    Report Status 03/09/2024 FINAL  Final  Culture, blood (x 2)     Status: None   Collection Time: 03/04/24  6:14 PM   Specimen: BLOOD RIGHT HAND  Result Value Ref Range Status   Specimen Description BLOOD RIGHT HAND  Final   Special Requests   Final    BOTTLES DRAWN AEROBIC ONLY Blood Culture results may not be optimal due to an inadequate volume of blood received in culture bottles   Culture   Final    NO GROWTH 5 DAYS Performed at [facility], [address] [place], [place] [address]    Report Status 03/09/2024 FINAL  Final     Radiology Studies: No results found.   Scheduled Meds:  acetaminophen   500 mg Oral TID   amiodarone   200 mg Oral BID   Followed by   Cecily Cohen ON 03/17/2024]  amiodarone   200 mg Oral Daily   ascorbic acid   500 mg Oral BID   atorvastatin   40 mg Oral Daily   Chlorhexidine  Gluconate Cloth  6 each Topical Daily   collagenase    Topical Daily   feeding supplement (GLUCERNA SHAKE)  237 mL Oral TID BM   ferrous sulfate   325 mg Oral QHS   insulin  aspart  0-15 Units Subcutaneous TID WC   insulin  aspart  0-5 Units Subcutaneous QHS   insulin  aspart  3 Units Subcutaneous TID WC   insulin  glargine-yfgn  15 Units Subcutaneous Daily   leptospermum manuka honey  1 Application Topical Daily   levothyroxine   50 mcg Oral Q0600   liver oil-zinc  oxide   Topical BID   metroNIDAZOLE   500 mg Oral Q12H   midodrine   5 mg Oral TID WC   phosphorus  500 mg Oral TID   polyethylene glycol  17 g Oral Daily   senna-docusate  1 tablet Oral BID   sodium chloride  flush  3 mL Intravenous Q12H   Tafamidis   1 capsule Oral Daily   zinc  sulfate (50mg  elemental zinc )  220 mg Oral Daily   Continuous Infusions:  ceFEPime  (MAXIPIME ) IV Stopped (03/10/24 3086)   heparin  1,500 Units/hr (03/10/24 0909)   potassium PHOSPHATE  IVPB (in mmol) 30 mmol (03/10/24 1006)  vancomycin  Stopped (03/10/24 0003)     LOS: 6 days   Leona Rake, MD [facility] P4/20/2025, 11:21 AM

## 2024-03-10 NOTE — Progress Notes (Addendum)
 Earl [name] [phone] [facility]? Hospital Liaison Note:??   ???   Notified by Camille Cedars, Palliative NP of family request for [facility]iative services in facility after discharge.????????   ????  Spoke with daughter Earl [name] via phone regarding outpatient palliative services. Was informed by daughter that this was not on the forefront of her concerns at this time. Stated she is more concerned with finding out the source of her fathers infection and having that treated properly along with keeping his pain under control without sedating him. She also stated that she wanted to speak with her brother and will notify either [facility] or Hospital Palliative Team if she wants to move forward with referral. Provided daughter with Website (per daughter request for further research of services) as well as phone number of [facility].   Please call with any hospice or outpatient palliative care related questions.???   ????  Thank you for the opportunity to participate in this patient's care.   Jacqlyn Matas, BSN, RN Hospice Nurse Liaison [phone]

## 2024-03-11 ENCOUNTER — Other Ambulatory Visit: Payer: Self-pay

## 2024-03-11 DIAGNOSIS — I4891 Unspecified atrial fibrillation: Secondary | ICD-10-CM | POA: Diagnosis not present

## 2024-03-11 DIAGNOSIS — L89159 Pressure ulcer of sacral region, unspecified stage: Secondary | ICD-10-CM | POA: Diagnosis not present

## 2024-03-11 DIAGNOSIS — E854 Organ-limited amyloidosis: Secondary | ICD-10-CM | POA: Diagnosis not present

## 2024-03-11 DIAGNOSIS — R6520 Severe sepsis without septic shock: Secondary | ICD-10-CM | POA: Diagnosis not present

## 2024-03-11 DIAGNOSIS — A419 Sepsis, unspecified organism: Secondary | ICD-10-CM | POA: Diagnosis not present

## 2024-03-11 DIAGNOSIS — I5022 Chronic systolic (congestive) heart failure: Secondary | ICD-10-CM | POA: Diagnosis not present

## 2024-03-11 DIAGNOSIS — L98429 Non-pressure chronic ulcer of back with unspecified severity: Secondary | ICD-10-CM

## 2024-03-11 LAB — HEPARIN LEVEL (UNFRACTIONATED): Heparin Unfractionated: 0.14 [IU]/mL — ABNORMAL LOW (ref 0.30–0.70)

## 2024-03-11 LAB — GLUCOSE, CAPILLARY
Glucose-Capillary: 158 mg/dL — ABNORMAL HIGH (ref 70–99)
Glucose-Capillary: 268 mg/dL — ABNORMAL HIGH (ref 70–99)
Glucose-Capillary: 240 mg/dL — ABNORMAL HIGH (ref 70–99)
Glucose-Capillary: 170 mg/dL — ABNORMAL HIGH (ref 70–99)

## 2024-03-11 LAB — CBC
HCT: 24.8 % — ABNORMAL LOW (ref 39.0–52.0)
Hemoglobin: 8.6 g/dL — ABNORMAL LOW (ref 13.0–17.0)
MCH: 29.1 pg (ref 26.0–34.0)
MCHC: 34.7 g/dL (ref 30.0–36.0)
MCV: 83.8 fL (ref 80.0–100.0)
Platelets: 319 10*3/uL (ref 150–400)
RBC: 2.96 MIL/uL — ABNORMAL LOW (ref 4.22–5.81)
RDW: 14.2 % (ref 11.5–15.5)
WBC: 13.8 10*3/uL — ABNORMAL HIGH (ref 4.0–10.5)
nRBC: 0 % (ref 0.0–0.2)

## 2024-03-11 LAB — PHOSPHORUS: Phosphorus: 3 mg/dL (ref 2.5–4.6)

## 2024-03-11 MED ORDER — APIXABAN 5 MG PO TABS
5.0000 mg | ORAL_TABLET | Freq: Two times a day (BID) | ORAL | Status: DC
  Administered 2024-03-11 – 2024-06-02 (×165): 5 mg via ORAL
  Filled 2024-03-11 (×17): qty 1
  Filled 2024-03-11: qty 2
  Filled 2024-03-11 (×15): qty 1
  Filled 2024-03-11: qty 2
  Filled 2024-03-11 (×117): qty 1
  Filled 2024-03-11: qty 2
  Filled 2024-03-11 (×17): qty 1

## 2024-03-11 MED ORDER — HYDROMORPHONE HCL 1 MG/ML IJ SOLN
0.2500 mg | INTRAMUSCULAR | Status: DC | PRN
  Administered 2024-03-11 – 2024-03-18 (×16): 0.25 mg via INTRAVENOUS
  Filled 2024-03-11 (×2): qty 0.5
  Filled 2024-03-11 (×6): qty 1
  Filled 2024-03-11: qty 0.5
  Filled 2024-03-11: qty 1
  Filled 2024-03-11 (×4): qty 0.5
  Filled 2024-03-11: qty 1
  Filled 2024-03-11 (×2): qty 0.5

## 2024-03-11 MED ORDER — HEPARIN BOLUS VIA INFUSION
1500.0000 [IU] | Freq: Once | INTRAVENOUS | Status: AC
  Administered 2024-03-11: 1500 [IU] via INTRAVENOUS
  Filled 2024-03-11: qty 1500

## 2024-03-11 NOTE — Evaluation (Signed)
 Occupational Therapy Evaluation Patient Details Name: Earl [name] MRN: [id number] DOB: 07/13/1941 Today's Date: 03/11/2024   History of Present Illness   83 y.o. male brought to the ED 4/14 from home by EMS with SoB and sacral pain. Found to have acute metabolic encephalopathy, hypotensive, hypoglycemic. Admitted to sepsis secondary to sacral pressure ulcer wound infection, Persistent atrial fibrillation/flutter with RVR  PMH 3/25 COVID admission, DM2, HTN, HLD, CHF, A-fib on Eliquis , stroke with residual right-sided weakness, MGUS, GERD, BPH, hypothyroidism     Clinical Impressions Pt presents with decline in function and safety with ADLs and ADL mobility with impaired strength, balance, endurance, B UE and LE AROM; pt limited by pain in B LEs, L hip and L UE. PTA pt was at [facility] rehab following recent hospitalization from COVID L UE with edema,soreness. Pt able to FF ~ 30 degrees in L shoulder, AAROM 0-90 degrees. Pt's daughter reports that prior to recent hospitalization for COVID, Per pt's daughter he was initially able to participate with therapy. Pt developed sacral wound that limited his ability to tolerate sitting up. Pt currently  requires extensive assist with all ADLs. Pt's daughter educated in providing P/AAROM of B UEs to increase  ROM/function to prevent further tightness in joints. Patient will benefit from continued inpatient follow up therapy, <3 hours/day. OT will follow pt acutely to maximize level of function and safety    If plan is discharge home, recommend the following:   A lot of help with walking and/or transfers;A lot of help with bathing/dressing/bathroom;Assistance with cooking/housework;Assist for transportation;Help with stairs or ramp for entrance;Two people to help with walking and/or transfers     Functional Status Assessment   Patient has had a recent decline in their functional status and demonstrates the ability to make significant improvements in  function in a reasonable and predictable amount of time.     Equipment Recommendations   Other (comment) (lift, hospital bed)     Recommendations for Other Services         Precautions/Restrictions   Precautions Precautions: Fall Recall of Precautions/Restrictions: Intact Restrictions Weight Bearing Restrictions Per Provider Order: No     Mobility Bed Mobility Overal bed mobility: Needs Assistance Bed Mobility: Rolling Rolling: Total assist, +2 for physical assistance         General bed mobility comments: pt requiring total A for repositioning in the bed.    Transfers                   General transfer comment: unable to tolerate seated position due to sacral wound      Balance                                           ADL either performed or assessed with clinical judgement   ADL Overall ADL's : Needs assistance/impaired Eating/Feeding: Minimal assistance;Sitting;Bed level   Grooming: Wash/dry hands;Wash/dry face;Oral care;Minimal assistance;Sitting;Bed level   Upper Body Bathing: Total assistance   Lower Body Bathing: Total assistance   Upper Body Dressing : Total assistance   Lower Body Dressing: Total assistance     Toilet Transfer Details (indicate cue type and reason): unable to attempt Toileting- Clothing Manipulation and Hygiene: Total assistance;Bed level         General ADL Comments: pt unable to sit EOB dur to sacral wound pain     Vision Baseline Vision/History: 1  Wears glasses Ability to See in Adequate Light: 0 Adequate Patient Visual Report: No change from baseline       Perception         Praxis         Pertinent Vitals/Pain Pain Assessment Pain Assessment: Faces Faces Pain Scale: Hurts even more Pain Location: pressure injuries on sacrum and R face, hip and shoulder Pain Descriptors / Indicators: Grimacing, Guarding, Moaning, Stabbing Pain Intervention(s): Limited activity within  patient's tolerance, Monitored during session, Repositioned     Extremity/Trunk Assessment Upper Extremity Assessment Upper Extremity Assessment: Generalized weakness;Right hand dominant;LUE deficits/detail;RUE deficits/detail RUE Deficits / Details: R UE affected from CVA ~6-7 years ago per daughter but pt was able to perform ADLs and reach overhead prior to recent COVID LUE Deficits / Details: L UE with edema,soreness. Pt able to FF ~ 30 degrees, AAROM 0-90 degrees. Pt's daughter reports that prior to recent hospitalizatioj for COVID, pt with B E AROM WFLs, performing all ADLs and was reaching overhead LUE: Unable to fully assess due to pain;Shoulder pain with ROM   Lower Extremity Assessment Lower Extremity Assessment: Defer to PT evaluation RLE Deficits / Details: decreased extension ROM in hip and knee, ankle has neutral positioning, unable to move against gravity RLE Coordination: decreased fine motor;decreased gross motor LLE Deficits / Details: RLinjury,decreased extension ROM in hip and knee, ankle has neutral positioning, unable to move against gravity LLE Coordination: decreased fine motor;decreased gross motor   Cervical / Trunk Assessment Cervical / Trunk Assessment: Kyphotic   Communication Communication Communication: Impaired Factors Affecting Communication: Difficulty expressing self   Cognition Arousal: Alert Behavior During Therapy: WFL for tasks assessed/performed Cognition: No apparent impairments                               Following commands: Impaired Following commands impaired: Follows one step commands with increased time, Follows multi-step commands with increased time     Cueing  General Comments   Cueing Techniques: Verbal cues;Tactile cues;Visual cues      Exercises Other Exercises Other Exercises: AAROM and PROM of B UEs in multiple planes ro increase AROM/function, pt's daughter educated on P/AAROM of B UEs   Shoulder  Instructions      Home Living Family/patient expects to be discharged to:: Skilled nursing facility   Available Help at Discharge: Family;Available PRN/intermittently Type of Home: House Home Access: Stairs to enter [employer] of Steps: 3 Entrance Stairs-Rails: Right Home Layout: One level     Bathroom Shower/Tub: Chief Strategy Officer: Standard Bathroom Accessibility: Yes   Home Equipment: Tub bench;Grab bars - tub/shower;Hand held shower head;BSC/3in1;Rolling Walker (2 wheels);Rollator (4 wheels);Wheelchair - manual   Additional Comments: was in rehab at [facility] s/p deconditioning with COVID, home alone prior      Prior Functioning/Environment Prior Level of Function : Needs assist             Mobility Comments: pt requiring assist to get out of bed and work with PT ADLs Comments: staff providing assists with ADLs and iADLs    OT Problem List: Decreased strength;Decreased range of motion;Decreased activity tolerance;Impaired balance (sitting and/or standing);Decreased knowledge of use of DME or AE;Decreased safety awareness   OT Treatment/Interventions: Self-care/ADL training;Therapeutic exercise;DME and/or AE instruction;Therapeutic activities;Patient/family education;Balance training      OT Goals(Current goals can be found in the care plan section)   Acute Rehab OT Goals Patient Stated Goal: back  to rehab, then home OT Goal Formulation: With patient/family Time For Goal Achievement: 03/25/24 Potential to Achieve Goals: Fair ADL Goals Pt Will Perform Eating: with supervision;with set-up;sitting;bed level;with caregiver independent in assisting Pt Will Perform Grooming: with supervision;with set-up;sitting;with caregiver independent in assisting;bed level Pt Will Perform Upper Body Dressing: with max assist;with mod assist;bed level Additional ADL Goal #1: pt will tolerate AA/PROM of B UEs in multiple planes to increase ROM/function  and to decrease risk of joint tightness and skin breakdown   OT Frequency:  Min 2X/week    Co-evaluation              AM-PAC OT "6 Clicks" Daily Activity     Outcome Measure Help from another person eating meals?: A Little Help from another person taking care of personal grooming?: A Little Help from another person toileting, which includes using toliet, bedpan, or urinal?: Total Help from another person bathing (including washing, rinsing, drying)?: Total Help from another person to put on and taking off regular upper body clothing?: Total Help from another person to put on and taking off regular lower body clothing?: Total 6 Click Score: 10   End of Session    Activity Tolerance: Patient limited by fatigue;Patient limited by pain Patient left: in bed;with call bell/phone within reach;with family/visitor present  OT Visit Diagnosis: Other abnormalities of gait and mobility (R26.89);Muscle weakness (generalized) (M62.81);History of falling (Z91.81);Pain Pain - Right/Left: Left Pain - part of body: Shoulder;Hip (B LEs with PROM)                Time: 1610-9604 OT Time Calculation (min): 43 min Charges:  OT General Charges $OT Visit: 1 Visit OT Evaluation $OT Eval Moderate Complexity: 1 Mod    [name] 03/11/2024, 2:30 PM

## 2024-03-11 NOTE — Progress Notes (Addendum)
 Patient Name: Earl [name] Date of Encounter: 03/11/2024 [facility] Cardiologist: Richardo Chandler, MD   Interval Summary  .    Patient has pain in his back. No chest pain or shortness of breath. Maintaining NSR per telemetry   Vital Signs .    Vitals:   03/10/24 1322 03/10/24 2100 03/11/24 0434 03/11/24 0735  BP: (!) 123/57 (!) 129/59 116/63 116/60  Pulse: 86  88 84  Resp: 12  19 18   Temp: 98.4 F (36.9 C) 99.4 F (37.4 C) 98.5 F (36.9 C) 98.2 F (36.8 C)  TempSrc: Oral Oral Axillary Oral  SpO2: 98%   98%  Weight:      Height:        Intake/Output Summary (Last 24 hours) at 03/11/2024 0957 Last data filed at 03/11/2024 0443 Gross per 24 hour  Intake 410.24 ml  Output 2225 ml  Net -1814.76 ml      03/04/2024    4:56 PM 02/14/2024    7:52 PM 01/24/2024   11:05 AM  Last 3 Weights  Weight (lbs) 151 lb 159 lb 159 lb  Weight (kg) 68.493 kg 72.122 kg 72.122 kg      Telemetry/ECG    NSR, occasional PACs. HR in the 80s-90s - Personally Reviewed  Physical Exam .   GEN: Chronically ill appearing, elderly male. Laying in the bed with head elevated  Neck: No JVD Cardiac:  RRR, no murmurs, rubs, or gallops.  Respiratory: Coarse breath sounds throughout. Normal WOB on room air  GI: Soft, nontender  MS: Trace lower extremity swelling   Assessment & Plan .    Atrial Fib/flutter with RVR  - Patient has history of paroxysmal atrial fibrillation, now admitted with sepsis secondary to sacral ulcer wound infection. Found to be in atrial flutter with RVR, HR as high as the 150 on admission.  - Treated with IV amiodarone  and digoxin . Converted to NSR 4/18 and he was transitioned to PO amiodarone . Digoxin  stopped  - Per telemetry, patient maintaining NSR  - Continue with 200 mg twice daily for 7 days followed by 200 mg daily.  - Eliquis  held and patient is on IV heparin  in case he needs wound debridement. Plan to transition back to eliquis  when able    Chronic  HFrEF Cardiac Amyloidosis  Non-ischemic cardiomyopathy  - Patient has history of non-ischemic cardiomyopathy and TRR cardiac amyloidosis. EF previously as low as 25-30% in 2022. Most recent echo from 08/2023 showed EF 45-50%, grade II DD, normal RV systolic function - BP stable on midodrine  5 mg TID. Holding home entresto , metoprolol   - Does not appear volume overloaded on exam. He is net -4 L since admission. No diuretics today  - Continue Tafamadis for TRR amyloid - On Vutrisiran injection every 3 month for TTR amyloid at home as well    Minimal non-obstructive CAD - Noted on cath from 09/2021 - No ASA as he is anticoagulated for afib  - Continue statin therapy     Hypertension  Orthostatic hypotension  - BP currently low in the setting of sepsis  - OK to hold home metoprolol  and entresto   - Continue midodrine  5 mg TID. BP stable on midodrine     Otherwise per primary  - sepsis, sacral ulcer  - Type 2 DM, insulin  dependent  - Hyperthyroidism  - GERD  - History of CVA  - Anemia of chronic disease  For questions or updates, please contact [facility] Please consult [email or web address] for contact info  under        Signed, Debria Fang, PA-C

## 2024-03-11 NOTE — Progress Notes (Signed)
 PROGRESS NOTE  Earl [name]  ZOX:[id number] DOB: 03-Jun-1941 DOA: 03/04/2024 PCP: [name], MD   Brief Narrative: Patient is a 83 year old male with history of paroxysmal A-fib, insulin -dependent diabetes, hypertension, hyperlipidemia, CVA with residual right-sided weakness, MGUS, GERD, BPH, hypothyroidism, HFrEF with EF of 40 to 45%, chronic pancytopenia who presented to the emergency department from SNF with complaint of shortness of breath, wound, pain in the sacral area.  He was just discharged from here to [facility] rehab 4 days ago.  Hospital course was remarkable for acute metabolic encephalopathy, hypotension, hypoglycemia.  On presentation, he was hypotensive, EKG showed A-fib with RVR, tachypnea but saturating fine on room air.  Respiratory panel negative for COVID.  PCCM consulted for persistent hypotension.  Cardiology also consulted.  General surgery consulted for sacral ulcer.  Started on broad-spectrum antibiotics, amiodarone  drip.  Status post bedside excisional debridement of the sacral wound on 4/15 and 4/17.  Wound care, cardiology, general surgery following.  No plan for further operative intervention for sacral ulcer.  ID consulted for assistance with antibiotics  Assessment & Plan:  Principal Problem:   Severe sepsis (HCC) Active Problems:   Paroxysmal atrial fibrillation (HCC)   Sepsis (HCC)   Atrial flutter (HCC)   Sacral ulcer (HCC)   MGUS (monoclonal gammopathy of unknown significance)   Essential hypertension   Hyperlipidemia   Insulin  dependent type 2 diabetes mellitus (HCC)   History of CVA (cerebrovascular accident)   Cardiac amyloidosis (HCC)   Chronic systolic CHF (congestive heart failure) (HCC)   GERD (gastroesophageal reflux disease)   History of hypertension   Atrial fibrillation with RVR (HCC)  Sepsis secondary to sacral pressure wound/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  Hypotensive, encephalopathic, hypoglycemic, elevated lactate, leukocytosis on presentation.  Started on IV fluid, broad spectrum antibiotics.  Follow-up cultures:NGTD Code sepsis was activated.  PCCM recommended to continue midodrine . Blood pressure has been better now with systolic in the range of 100s.  Currently he is alert and mostly oriented.  Chronic sacral wound with suspicion of infection: CT chest/abdomen/pelvis showed sacral decubitus wound with extension to the right gluteus muscle, no definitive bony lesions,no signs of osteomyelitis.  General surgery consulted,   Continue current antibiotics.  PT consulted for hydrotherapy.  Continue zinc , vitamin C .Status post bedside excisional debridement of the sacral wound on 4/15 and 4/17.  Observed to have purulent drainage.  Surgery recommended to continue twice daily wet-to-dry dressing changes with Dakin's.  General surgery not planning for further surgical debridement.  Wound care nurse following for other pressure ulcers.  Needs to continue wound care on discharge  History of paroxysmal A-fib: Presented with A-fib with RVR.  Toprol  was discontinued on last admission.  Started on amiodarone  drip.  Cardiology following.  Eliquis  on hold for possible need of debridement, on heparin  drip.  He remains in normal sinus rhythm with controlled heart rate today.  Digoxin  discontinued.  Amiodarone  changed to oral.  Eliquis  will be restarted  History of HFrEF/transthyretin cardiac amyloidosis/nonischemic cardiomyopathy/hypertension: Presented with hypotension.  Takes Entresto , Toprol  at home,. Currently on hold.  Given IV fluids initially.    Cardiology team was following,continue midodrine   Insulin -dependent diabetes type 2: Presented with hyperglycemia.  Recent A1c in the range of 8.  Diabetic coordinator consulted.  Currently on sliding scale, Lantus   History of CVA: Has a right-sided residual weakness.  On  Eliquis , Lipitor.PT consulted  GERD: Continue Protonix  Anemia of chronic disease: Continue iron supplementation  Hypothyroidism: Continue levothyroxine   Recent history of COVID: COVID was positive on 02/14/2024.  Still positive.  Out of 21 days window  Severe protein calorie malnutrition/suspicion for dysphagia/hypophosphatemia: Speech therapy consulted.  Phosphorus being  supplemented and corrected.  Dietitian consulted.  Constipation: Continue bowel regimen  Goals of care: Very deconditioned elderly male with bedbound status, multiple comorbidities, sacral pressure ulcer.  CODE STATUS DNR.  Palliative care consulted for goals of care.  DNR. Most likely he will need SNF on discharge.  PT/OT consulted   Nutrition Problem: Inadequate oral intake Etiology: decreased appetite Pressure Injury 02/26/24 Coccyx Mid Unstageable - Full thickness tissue loss in which the base of the injury is covered by slough (yellow, tan, gray, green or brown) and/or eschar (tan, brown or black) in the wound bed. (Active)  02/26/24 0916  Location: Coccyx  Location Orientation: Mid  Staging: Unstageable - Full thickness tissue loss in which the base of the injury is covered by slough (yellow, tan, gray, green or brown) and/or eschar (tan, brown or black) in the wound bed.  Wound Description (Comments):   Present on Admission: No  Dressing Type Foam - Lift dressing to assess site every shift 03/11/24 0830     Pressure Injury 02/26/24 Toe (Comment  which one) Anterior;Right Deep Tissue Pressure Injury - Purple or maroon localized area of discolored intact skin or blood-filled blister due to damage of underlying soft tissue from pressure and/or shear. right gre (Active)  02/26/24 1215  Location: Toe (Comment  which one)  Location Orientation: Anterior;Right  Staging: Deep Tissue Pressure Injury - Purple or maroon localized area of discolored intact skin or blood-filled blister due to damage of underlying soft  tissue from pressure and/or shear.  Wound Description (Comments): right great toe  Present on Admission:   Dressing Type Foam - Lift dressing to assess site every shift 03/11/24 0830     Pressure Injury 03/06/24 Hip Anterior;Left;Proximal Deep Tissue Pressure Injury - Purple or maroon localized area of discolored intact skin or blood-filled blister due to damage of underlying soft tissue from pressure and/or shear. Fluid filled blister w (Active)  03/06/24 1612  Location: Hip  Location Orientation: Anterior;Left;Proximal  Staging: Deep Tissue Pressure Injury - Purple or maroon localized area of discolored intact skin or blood-filled blister due to damage of underlying soft tissue from pressure and/or shear.  Wound Description (Comments): Fluid filled blister with purple discoloration  Present on Admission: No  Dressing Type Foam - Lift dressing to assess site every shift 03/11/24 0830     Pressure Injury 03/06/24 Shoulder Left Deep Tissue Pressure Injury - Purple or maroon localized area of discolored intact skin or blood-filled blister due to damage of underlying soft tissue from pressure and/or shear. dark purple discoloration to should (Active)  03/06/24 1613  Location: Shoulder  Location Orientation: Left  Staging: Deep Tissue Pressure Injury - Purple or maroon localized area of discolored intact skin or blood-filled blister due to damage of underlying soft tissue from pressure and/or shear.  Wound Description (Comments): dark purple discoloration to shoulder  Present on Admission: No  Dressing Type Foam - Lift dressing to assess site every shift 03/11/24 0830    DVT prophylaxis:SCDs Start: 03/04/24 2151 Place TED hose Start: 03/04/24 2151     Code Status: Limited: Do not attempt resuscitation (DNR) -DNR-LIMITED -Do Not Intubate/DNI   Family Communication: Daughter at bedside on 4/21  Patient status:Inpatient  Patient is from :home  Anticipated  discharge to:SNF likely    Estimated DC date:not sure   Consultants:Cardiology,pccm ,surgery  Procedures:None yet  Antimicrobials:  Anti-infectives (From admission, onward)    Start     Dose/Rate Route Frequency Ordered Stop   03/07/24 2200  metroNIDAZOLE  (FLAGYL ) tablet 500 mg        500 mg Oral Every 12 hours 03/07/24 0840 03/12/24 0959   03/05/24 1930  vancomycin  (VANCOREADY) IVPB 1250 mg/250 mL  Status:  Discontinued        1,250 mg 166.7 mL/hr over 90 Minutes Intravenous Every 24 hours 03/04/24 2205 03/04/24 2224   03/05/24 1930  Vancomycin  (VANCOCIN ) 1,250 mg in sodium chloride  0.9 % 250 mL IVPB  Status:  Discontinued        1,250 mg 166.7 mL/hr over 90 Minutes Intravenous Every 24 hours 03/04/24 2224 03/05/24 0717   03/05/24 1930  vancomycin  (VANCOREADY) IVPB 1500 mg/300 mL        1,500 mg 150 mL/hr over 120 Minutes Intravenous Every 24 hours 03/05/24 0717     03/05/24 1400  ceFEPIme  (MAXIPIME ) 2 g in sodium chloride  0.9 % 100 mL IVPB        2 g 200 mL/hr over 30 Minutes Intravenous Every 8 hours 03/05/24 0717     03/05/24 0600  ceFEPIme  (MAXIPIME ) 2 g in sodium chloride  0.9 % 100 mL IVPB  Status:  Discontinued        2 g 200 mL/hr over 30 Minutes Intravenous Every 12 hours 03/04/24 2205 03/05/24 0717   03/05/24 0000  metroNIDAZOLE  (FLAGYL ) IVPB 500 mg  Status:  Discontinued        500 mg 100 mL/hr over 60 Minutes Intravenous Every 12 hours 03/04/24 2206 03/04/24 2209   03/04/24 1730  aztreonam (AZACTAM) 2 g in sodium chloride  0.9 % 100 mL IVPB  Status:  Discontinued        2 g 200 mL/hr over 30 Minutes Intravenous  Once 03/04/24 1715 03/04/24 1724   03/04/24 1730  metroNIDAZOLE  (FLAGYL ) IVPB 500 mg  Status:  Discontinued        500 mg 100 mL/hr over 60 Minutes Intravenous Every 12 hours 03/04/24 1715 03/07/24 0840   03/04/24 1730  vancomycin  (VANCOREADY) IVPB 1500 mg/300 mL        1,500 mg 150 mL/hr over 120 Minutes Intravenous  Once 03/04/24 1715 03/04/24 2153   03/04/24 1730  ceFEPIme   (MAXIPIME ) 2 g in sodium chloride  0.9 % 100 mL IVPB        2 g 200 mL/hr over 30 Minutes Intravenous  Once 03/04/24 1724 03/04/24 1826       Subjective: Patient seen and examined at bedside today.  Lying on bed.  Complains of pain in the sacral ulcer site.  Heart rate well-controlled.  Currently in normal sinus rhythm.  He was being fed at bedside.  Daughter  at bedside.  Appears overall comfortable  Objective: Vitals:   03/10/24 1322 03/10/24 2100 03/11/24 0434 03/11/24 0735  BP: (!) 123/57 (!) 129/59 116/63 116/60  Pulse: 86  88 84  Resp: 12  19 18   Temp: 98.4 F (36.9 C) 99.4 F (37.4 C) 98.5 F (36.9 C) 98.2 F (36.8 C)  TempSrc: Oral Oral Axillary Oral  SpO2: 98%   98%  Weight:      Height:        Intake/Output Summary (Last 24 hours) at 03/11/2024 1159 Last data filed at 03/11/2024 1010 Gross per 24 hour  Intake 410.24 ml  Output 2975  ml  Net -2564.76 ml   Filed Weights   03/04/24 1656  Weight: 68.5 kg    Examination:  General exam: Overall comfortable, not in distress, chronically deconditioned, chronically ill looking HEENT: PERRL Respiratory system:  no wheezes or crackles, diminished breath sounds on bases Cardiovascular system: S1 & S2 heard, RRR.  Gastrointestinal system: Abdomen is nondistended, soft and nontender. Central nervous system: Alert and oriented Extremities: No edema, no clubbing ,no cyanosis Skin: Sacral pressure ulcers, sacral pressure ulcer   Data Reviewed: I have personally reviewed following labs and imaging studies  CBC: Recent Labs  Lab 03/04/24 1706 03/05/24 0446 03/07/24 0633 03/08/24 0445 03/09/24 0832 03/10/24 0555 03/11/24 0657  WBC 13.3*   < > 11.1* 12.5* 10.2 12.2* 13.8*  NEUTROABS 10.8*  --   --   --   --   --   --   HGB 10.8*   < > 9.4* 9.5* 9.3* 8.7* 8.6*  HCT 30.8*   < > 27.3* 27.4* 26.4* 25.0* 24.8*  MCV 84.2   < > 84.3 83.8 83.3 83.6 83.8  PLT 378   < > 297 302 272 265 319   < > = values in this interval  not displayed.   Basic Metabolic Panel: Recent Labs  Lab 03/04/24 1706 03/05/24 0446 03/05/24 1412 03/06/24 0501 03/06/24 0501 03/07/24 7829 03/08/24 0445 03/09/24 0832 03/10/24 0555 03/11/24 0657  NA 130* 135  --  136  --   --  134*  --  134*  --   K 5.0 4.2  --  4.1  --   --  3.7  --  4.0  --   CL 100 106  --  109  --   --  101  --  105  --   CO2 21* 23  --  20*  --   --  21*  --  22  --   GLUCOSE 500* 416*  --  166*  --   --  160*  --  149*  --   BUN 28* 18  --  16  --   --  16  --  16  --   CREATININE 1.02 0.82  --  0.87  --   --  0.86  --  0.80  --   CALCIUM  7.8* 7.9*  --  7.8*  --   --  7.4*  --  7.4*  --   MG  --   --  1.7 1.7  --   --   --   --   --   --   PHOS  --   --   --  2.1*   < > 2.4* 2.1* 2.3* 2.0* 3.0   < > = values in this interval not displayed.     Recent Results (from the past 240 hours)  Resp panel by RT-PCR (RSV, Flu A&B, Covid) Anterior Nasal Swab     Status: Abnormal   Collection Time: 03/04/24  5:38 PM   Specimen: Anterior Nasal Swab  Result Value Ref Range Status   SARS Coronavirus 2 by RT PCR POSITIVE (A) NEGATIVE Final   Influenza A by PCR NEGATIVE NEGATIVE Final   Influenza B by PCR NEGATIVE NEGATIVE Final    Comment: (NOTE) The Xpert Xpress SARS-CoV-2/FLU/RSV plus assay is intended as an aid in the diagnosis of influenza from Nasopharyngeal swab specimens and should not be used as a sole basis for treatment. Nasal washings and aspirates are unacceptable for Xpert Xpress SARS-CoV-2/FLU/RSV  testing.  Fact Sheet for Patients: BloggerCourse.com  Fact Sheet for Healthcare Providers: SeriousBroker.it  This test is not yet approved or cleared by the [place]  FDA and has been authorized for detection and/or diagnosis of SARS-CoV-2 by FDA under an Emergency Use Authorization (EUA). This EUA will remain in effect (meaning this test can be used) for the duration of the COVID-19 declaration  under Section 564(b)(1) of the Act, 21 U.S.C. section 360bbb-3(b)(1), unless the authorization is terminated or revoked.     Resp Syncytial Virus by PCR NEGATIVE NEGATIVE Final    Comment: (NOTE) Fact Sheet for Patients: BloggerCourse.com  Fact Sheet for Healthcare Providers: SeriousBroker.it  This test is not yet approved or cleared by the [place]  FDA and has been authorized for detection and/or diagnosis of SARS-CoV-2 by FDA under an Emergency Use Authorization (EUA). This EUA will remain in effect (meaning this test can be used) for the duration of the COVID-19 declaration under Section 564(b)(1) of the Act, 21 U.S.C. section 360bbb-3(b)(1), unless the authorization is terminated or revoked.  Performed at [facility], [address] [place], [place] [address]   Culture, blood (x 2)     Status: None   Collection Time: 03/04/24  5:44 PM   Specimen: BLOOD LEFT HAND  Result Value Ref Range Status   Specimen Description BLOOD LEFT HAND  Final   Special Requests   Final    BOTTLES DRAWN AEROBIC ONLY Blood Culture adequate volume   Culture   Final    NO GROWTH 5 DAYS Performed at [facility], [address] [place], [place] [address]    Report Status 03/09/2024 FINAL  Final  Culture, blood (x 2)     Status: None   Collection Time: 03/04/24  6:14 PM   Specimen: BLOOD RIGHT HAND  Result Value Ref Range Status   Specimen Description BLOOD RIGHT HAND  Final   Special Requests   Final    BOTTLES DRAWN AEROBIC ONLY Blood Culture results may not be optimal due to an inadequate volume of blood received in culture bottles   Culture   Final    NO GROWTH 5 DAYS Performed at [facility], [address], [place], [place] [address]    Report Status 03/09/2024 FINAL  Final     Radiology Studies: US  EKG SITE RITE Result Date: 03/11/2024 If Site Rite image not attached, placement could not be confirmed due to  current cardiac rhythm.    Scheduled Meds:  acetaminophen   500 mg Oral TID   amiodarone   200 mg Oral BID   Followed by   Cecily Cohen ON 03/17/2024] amiodarone   200 mg Oral Daily   ascorbic acid   500 mg Oral BID   atorvastatin   40 mg Oral Daily   Chlorhexidine  Gluconate Cloth  6 each Topical Daily   collagenase    Topical Daily   feeding supplement (GLUCERNA SHAKE)  237 mL Oral TID BM   ferrous sulfate   325 mg Oral QHS   insulin  aspart  0-15 Units Subcutaneous TID WC   insulin  aspart  0-5 Units Subcutaneous QHS   insulin  aspart  3 Units Subcutaneous TID WC   insulin  glargine-yfgn  15 Units Subcutaneous Daily   leptospermum manuka honey  1 Application Topical Daily   levothyroxine   50 mcg Oral Q0600   liver oil-zinc  oxide   Topical BID   metroNIDAZOLE   500 mg Oral Q12H   midodrine   5 mg Oral TID WC   phosphorus  500 mg Oral TID   polyethylene glycol  17 g Oral Daily   senna-docusate  1 tablet Oral BID   sodium chloride  flush  3 mL Intravenous Q12H   Tafamidis   1 capsule Oral Daily   zinc  sulfate (50mg  elemental zinc )  220 mg Oral Daily   Continuous Infusions:  sodium chloride      ceFEPime  (MAXIPIME ) IV 2 g (03/11/24 0602)   heparin  1,650 Units/hr (03/11/24 1010)   vancomycin  1,500 mg (03/10/24 2104)     LOS: 7 days   Leona Rake, MD [facility] P4/21/2025, 11:59 AM

## 2024-03-11 NOTE — Progress Notes (Incomplete)
 Amoxicillin challenge?

## 2024-03-11 NOTE — Evaluation (Signed)
 Physical Therapy Evaluation Patient Details Name: Earl [name] MRN: [id number] DOB: [birth year] Today's Date: 03/11/2024  History of Present Illness  83 y.o. male brought to the ED 4/14 from home by EMS with SoB and sacral pain. Found to have acute metabolic encephalopathy, hypotensive, hypoglycemic. Admitted to sepsis secondary to sacral pressure ulcer wound infection, Persistent atrial fibrillation/flutter with RVR  PMH 3/25 COVID admission, DM2, HTN, HLD, CHF, A-fib on Eliquis , stroke with residual right-sided weakness, MGUS, GERD, BPH, hypothyroidism  Clinical Impression  PTA pt was in rehab at [facility] participating in rehab from prior hospitalization due to COVID. Pt was initially able to participate with therapy. Pt developed sacral wound that limited his ability to tolerate sitting up. Pt is currently limited in safe mobility by increased pain from sacral wound and stretching to increase ROM in LE. With gentle ROM PT able to increase ROM in hip and knee extension. Daughter educated in providing PROM of LE to preserve ROM and provide relief from tightness in joints. Patient will benefit from continued inpatient follow up therapy, <3 hours/day. PT will continue to follow acutely.          If plan is discharge home, recommend the following: Two people to help with walking and/or transfers;A lot of help with bathing/dressing/bathroom;Assistance with cooking/housework;Assistance with feeding;Direct supervision/assist for medications management;Direct supervision/assist for financial management;Assist for transportation;Help with stairs or ramp for entrance   Can travel by private vehicle   No    Equipment Recommendations Hospital bed;Hoyer lift     Functional Status Assessment Patient has had a recent decline in their functional status and demonstrates the ability to make significant improvements in function in a reasonable and predictable amount of time.     Precautions /  Restrictions Restrictions Weight Bearing Restrictions Per Provider Order: No      Mobility  Bed Mobility Overal bed mobility: Needs Assistance Bed Mobility: Rolling Rolling: Total assist, +2 for physical assistance         General bed mobility comments: pt requiring total A for repositioning in the bed.    Transfers                   General transfer comment: unable to tolerate seated position due to sacral wound'          Pertinent Vitals/Pain Pain Assessment Pain Assessment: Faces Pain Location: pressure injuries on sacrum and R face, hip and sholder Pain Descriptors / Indicators: Grimacing, Guarding, Moaning, Stabbing Pain Intervention(s): Limited activity within patient's tolerance, Monitored during session, Repositioned    Home Living Family/patient expects to be discharged to:: Skilled nursing facility                   Additional Comments: was in rehab at [facility] s/p deconditioning with COVID    Prior Function Prior Level of Function : Needs assist             Mobility Comments: pt requiring assist to get out of bed and work with PT ADLs Comments: staff providing assists with ADLs and iADLs     Extremity/Trunk Assessment   Upper Extremity Assessment Upper Extremity Assessment: Defer to OT evaluation    Lower Extremity Assessment Lower Extremity Assessment: RLE deficits/detail;LLE deficits/detail RLE Deficits / Details: decreased extension ROM in hip and knee, ankle has neutral positioning, unable to move against gravity RLE Coordination: decreased fine motor;decreased gross motor LLE Deficits / Details: RLinjury,decreased extension ROM in hip and knee, ankle has neutral positioning, unable to  move against gravity LLE Coordination: decreased fine motor;decreased gross motor    Cervical / Trunk Assessment Cervical / Trunk Assessment: Kyphotic  Communication   Communication Communication: Impaired Factors Affecting  Communication: Difficulty expressing self    Cognition Arousal: Alert Behavior During Therapy: WFL for tasks assessed/performed   PT - Cognitive impairments: Problem solving, Memory                       PT - Cognition Comments: slowed processing Following commands: Impaired Following commands impaired: Follows one step commands with increased time, Follows multi-step commands with increased time     Cueing Cueing Techniques: Verbal cues, Tactile cues, Visual cues     General Comments      Exercises Other Exercises Other Exercises: PROM/Stretching of B hip, knee, able to increase ROM but unable to bring to neutral   Assessment/Plan    PT Assessment Patient needs continued PT services  PT Problem List Decreased strength;Decreased range of motion;Decreased activity tolerance;Decreased balance;Decreased mobility;Impaired sensation;Decreased skin integrity;Pain       PT Treatment Interventions DME instruction;Functional mobility training;Therapeutic activities;Therapeutic exercise;Balance training;Cognitive remediation;Patient/family education    PT Goals (Current goals can be found in the Care Plan section)  Acute Rehab PT Goals PT Goal Formulation: With patient/family Time For Goal Achievement: 03/25/24 Potential to Achieve Goals: Fair    Frequency Min 2X/week        AM-PAC PT "6 Clicks" Mobility  Outcome Measure Help needed turning from your back to your side while in a flat bed without using bedrails?: Total Help needed moving from lying on your back to sitting on the side of a flat bed without using bedrails?: Total Help needed moving to and from a bed to a chair (including a wheelchair)?: Total Help needed standing up from a chair using your arms (e.g., wheelchair or bedside chair)?: Total Help needed to walk in hospital room?: Total Help needed climbing 3-5 steps with a railing? : Total 6 Click Score: 6    End of Session   Activity Tolerance: Patient  limited by pain Patient left: in bed;with call bell/phone within reach;with family/visitor present Nurse Communication: Need for lift equipment PT Visit Diagnosis: Muscle weakness (generalized) (M62.81);Adult, failure to thrive (R62.7);Difficulty in walking, not elsewhere classified (R26.2) Pain - part of body:  (sacrum and all joints with ROM)    Time: 1610-9604 PT Time Calculation (min) (ACUTE ONLY): 41 min   Charges:   PT Evaluation $PT Eval Moderate Complexity: 1 Mod PT Treatments $Therapeutic Activity: 8-22 mins PT General Charges $$ ACUTE PT VISIT: 1 Visit         Annamae Shivley B. Jewel Mortimer PT, DPT Acute Rehabilitation Services Please use secure chat or  Call Office [phone]   Verlie Glisson [name] 03/11/2024, 1:42 PM

## 2024-03-11 NOTE — Progress Notes (Signed)
 Pharmacy Antibiotic Note  Earl [name] is a 83 y.o. male admitted on 03/04/2024 with sepsis from sacral wounds.  Pharmacy has been consulted for vancomycin  and cefepime  dosing.   -cultures- neg -SCr 0.8 -Per MD, plans are for ID consult  Plan: Continue Cefepime  2g q8h  Continue Vancomycin  1500mg  q24h, estimated AUC: 462 Follow culture data for de-escalation.  Monitor renal function for dose adjustments as indicated.   Height: 5\' 11"  (180.3 cm) Weight: 68.5 kg (151 lb) IBW/kg (Calculated) : 75.3  Temp (24hrs), Avg:98.6 F (37 C), Min:98.2 F (36.8 C), Max:99.4 F (37.4 C)  Recent Labs  Lab 03/04/24 1706 03/04/24 1744 03/04/24 2004 03/05/24 0446 03/05/24 1412 03/06/24 0501 03/07/24 8295 03/08/24 0445 03/08/24 1822 03/09/24 0832 03/10/24 0555 03/11/24 0657  WBC 13.3*  --   --  10.7*  --  10.6* 11.1* 12.5*  --  10.2 12.2* 13.8*  CREATININE 1.02  --   --  0.82  --  0.87  --  0.86  --   --  0.80  --   LATICACIDVEN  --  2.8* 2.3* 1.5 2.0*  --   --   --   --   --   --   --   VANCOTROUGH  --   --   --   --   --   --   --   --  12*  --   --   --     Estimated Creatinine Clearance: 69 mL/min (by C-G formula based on SCr of 0.8 mg/dL).    Allergies  Allergen Reactions   Penicillins Other (See Comments)    Did it involve swelling of the face/tongue/throat, SOB, or low BP? Unknown Did it involve sudden or severe rash/hives, skin peeling, or any reaction on the inside of your mouth or nose? Unknown Did you need to seek medical attention at a hospital or doctor's office? No When did it last happen?    Over 30 Years Ago   If all above answers are "NO", may proceed with cephalosporin use.      Antimicrobials this admission: Cefepime  4/15 >>  Vancomycin  4/15 >>  Flagyl  4/14>>  Microbiology results: 4/14 BCx: ng  Thank you for allowing pharmacy to be a part of this patient's care.  Baxter Limber, PharmD Clinical Pharmacist **Pharmacist phone directory can now be  found on [email or web address] ([email or web address]).  Listed under [facility].

## 2024-03-11 NOTE — Consult Note (Signed)
 [facility]  Total days of antibiotics 8  Reason for Consult:sacral wound    Referring Physician: Murray Arnold  Principal Problem:   Severe sepsis (HCC) Active Problems:   Paroxysmal atrial fibrillation (HCC)   MGUS (monoclonal gammopathy of unknown significance)   Essential hypertension   Hyperlipidemia   Insulin  dependent type 2 diabetes mellitus (HCC)   History of CVA (cerebrovascular accident)   Cardiac amyloidosis (HCC)   Chronic systolic CHF (congestive heart failure) (HCC)   Sepsis (HCC)   Atrial flutter (HCC)   GERD (gastroesophageal reflux disease)   Sacral ulcer (HCC)   History of hypertension   Atrial fibrillation with RVR (HCC)    HPI: Earl [name] is a 83 y.o. male who is a SNF resident who has hx of pAfib, T2DM, MGUS, hx of stroke with right sided weakness, who was recovering from covid-19 illness roughly 2 weeks ago admitted for fever, hypotension, progressive pain to his sacral area due to increased sacral wound and increased drainage. He was started on cefepime , metronidazole  plus vancomycin . He wa sseen by wound care nurse to evaluate his multiple pressure injuries. Sacral wound had eschar and exudate that is now improving with medihoney/chemical debridement. ID asked to weigh in on abtx recommendations. He was seen by general surgery and felt that he did not need  surgical debridement.  He has hx of penicillin allergy as a child but doesn't recall reaction  Past Medical History:  Diagnosis Date   Adenomatous colon polyp    Allergy    Anal fissure    Arthritis    BPH (benign prostatic hyperplasia)    Diabetes mellitus (HCC)    GERD (gastroesophageal reflux disease)    Hyperlipidemia    Hypertension    Hypothyroidism    Other testicular hypofunction    Stroke (HCC)     Allergies:  Allergies  Allergen Reactions   Penicillins Other (See Comments)    Did it involve swelling of the face/tongue/throat, SOB, or low BP?  Unknown Did it involve sudden or severe rash/hives, skin peeling, or any reaction on the inside of your mouth or nose? Unknown Did you need to seek medical attention at a hospital or doctor's office? No When did it last happen?    Over 47 Years Ago   If all above answers are "NO", may proceed with cephalosporin use.      MEDICATIONS:  acetaminophen   500 mg Oral TID   amiodarone   200 mg Oral BID   Followed by   Cecily Cohen ON 03/17/2024] amiodarone   200 mg Oral Daily   apixaban   5 mg Oral BID   ascorbic acid   500 mg Oral BID   atorvastatin   40 mg Oral Daily   Chlorhexidine  Gluconate Cloth  6 each Topical Daily   collagenase    Topical Daily   feeding supplement (GLUCERNA SHAKE)  237 mL Oral TID BM   ferrous sulfate   325 mg Oral QHS   insulin  aspart  0-15 Units Subcutaneous TID WC   insulin  aspart  0-5 Units Subcutaneous QHS   insulin  aspart  3 Units Subcutaneous TID WC   insulin  glargine-yfgn  15 Units Subcutaneous Daily   leptospermum manuka honey  1 Application Topical Daily   levothyroxine   50 mcg Oral Q0600   liver oil-zinc  oxide   Topical BID   metroNIDAZOLE   500 mg Oral Q12H   midodrine   5 mg Oral TID WC   phosphorus  500 mg Oral TID   polyethylene glycol  17 g Oral Daily   senna-docusate  1 tablet Oral BID   sodium chloride  flush  3 mL Intravenous Q12H   Tafamidis   1 capsule Oral Daily   zinc  sulfate (50mg  elemental zinc )  220 mg Oral Daily    Social History   Tobacco Use   Smoking status: Never   Smokeless tobacco: Never  Vaping Use   Vaping status: Never Used  Substance Use Topics   Alcohol use: No   Drug use: No    Family History  Problem Relation Age of Onset   Sickle cell anemia Mother    Diabetes Father    Stomach cancer Father    Colon cancer Father    CAD Neg Hx    Esophageal cancer Neg Hx    Rectal cancer Neg Hx     Review of Systems -  +buttock pain. 12 point ros is otherwise negative  OBJECTIVE: Temp:  [98.2 F (36.8 C)-99.4 F (37.4 C)]  98.9 F (37.2 C) (04/21 1500) Pulse Rate:  [84-93] 93 (04/21 1500) Resp:  [18-20] 20 (04/21 1500) BP: (116-132)/(59-63) 132/62 (04/21 1500) SpO2:  [96 %-98 %] 96 % (04/21 1500) Physical Exam  Constitutional: He is oriented to person, place, and time. He appears well-developed and well-nourished. No distress.  HENT:  Mouth/Throat: Oropharynx is clear and moist. No oropharyngeal exudate.  Cardiovascular: Normal rate, regular rhythm and normal heart sounds. Exam reveals no gallop and no friction rub.  No murmur heard.  Pulmonary/Chest: Effort normal and breath sounds normal. No respiratory distress. He has no wheezes.  Abdominal: Soft. Bowel sounds are normal. He exhibits no distension. There is no tenderness.  Lymphadenopathy:  He has no cervical adenopathy.  Neurological: He is alert and oriented to person, place, and time. Decreased mobility from stroke Skin: Skin is warm and dry. No rash noted. No erythema.  Psychiatric: He has a normal mood and affect. His behavior is normal.   LABS: Results for orders placed or performed during the hospital encounter of 03/04/24 (from the past 48 hours)  Glucose, capillary     Status: Abnormal   Collection Time: 03/09/24  9:21 PM  Result Value Ref Range   Glucose-Capillary 248 (H) 70 - 99 mg/dL    Comment: Glucose reference range applies only to samples taken after fasting for at least 8 hours.  Heparin  level (unfractionated)     Status: Abnormal   Collection Time: 03/10/24  5:55 AM  Result Value Ref Range   Heparin  Unfractionated 0.16 (L) 0.30 - 0.70 IU/mL    Comment: (NOTE) The clinical reportable range upper limit is being lowered to >1.10 to align with the FDA approved guidance for the current laboratory assay.  If heparin  results are below expected values, and patient dosage has  been confirmed, suggest follow up testing of antithrombin III levels. Performed at [facility], [address], [place], [place] [address]   CBC      Status: Abnormal   Collection Time: 03/10/24  5:55 AM  Result Value Ref Range   WBC 12.2 (H) 4.0 - 10.5 K/uL   RBC 2.99 (L) 4.22 - 5.81 MIL/uL   Hemoglobin 8.7 (L) 13.0 - 17.0 g/dL   HCT 19.1 (L) 47.8 - 29.5 %   MCV 83.6 80.0 - 100.0 fL   MCH 29.1 26.0 - 34.0 pg   MCHC 34.8 30.0 - 36.0 g/dL   RDW 62.1 30.8 - 65.7 %   Platelets 265 150 - 400 K/uL   nRBC  0.0 0.0 - 0.2 %    Comment: Performed at [facility], [address], [place], [place] [address]  Phosphorus     Status: Abnormal   Collection Time: 03/10/24  5:55 AM  Result Value Ref Range   Phosphorus 2.0 (L) 2.5 - 4.6 mg/dL    Comment: Performed at [facility], [address] [place], [place] [address]  Basic metabolic panel with GFR     Status: Abnormal   Collection Time: 03/10/24  5:55 AM  Result Value Ref Range   Sodium 134 (L) 135 - 145 mmol/L   Potassium 4.0 3.5 - 5.1 mmol/L   Chloride 105 98 - 111 mmol/L   CO2 22 22 - 32 mmol/L   Glucose, Bld 149 (H) 70 - 99 mg/dL    Comment: Glucose reference range applies only to samples taken after fasting for at least 8 hours.   BUN 16 8 - 23 mg/dL   Creatinine, Ser 0.98 0.61 - 1.24 mg/dL   Calcium  7.4 (L) 8.9 - 10.3 mg/dL   GFR, Estimated >11 >91 mL/min    Comment: (NOTE) Calculated using the CKD-EPI Creatinine Equation (2021)    Anion gap 7 5 - 15    Comment: Performed at [facility], [address] [place], [place] [address]  Glucose, capillary     Status: Abnormal   Collection Time: 03/10/24  7:40 AM  Result Value Ref Range   Glucose-Capillary 152 (H) 70 - 99 mg/dL    Comment: Glucose reference range applies only to samples taken after fasting for at least 8 hours.  Glucose, capillary     Status: Abnormal   Collection Time: 03/10/24 11:54 AM  Result Value Ref Range   Glucose-Capillary 267 (H) 70 - 99 mg/dL    Comment: Glucose reference range applies only to samples taken after fasting for at least 8 hours.  Glucose, capillary     Status: Abnormal    Collection Time: 03/10/24  4:09 PM  Result Value Ref Range   Glucose-Capillary 278 (H) 70 - 99 mg/dL    Comment: Glucose reference range applies only to samples taken after fasting for at least 8 hours.  Heparin  level (unfractionated)     Status: Abnormal   Collection Time: 03/10/24  5:52 PM  Result Value Ref Range   Heparin  Unfractionated 0.21 (L) 0.30 - 0.70 IU/mL    Comment: (NOTE) The clinical reportable range upper limit is being lowered to >1.10 to align with the FDA approved guidance for the current laboratory assay.  If heparin  results are below expected values, and patient dosage has  been confirmed, suggest follow up testing of antithrombin III levels. Performed at [facility], [address] [place], [place] [address]   Glucose, capillary     Status: Abnormal   Collection Time: 03/10/24  9:21 PM  Result Value Ref Range   Glucose-Capillary 190 (H) 70 - 99 mg/dL    Comment: Glucose reference range applies only to samples taken after fasting for at least 8 hours.  CBC     Status: Abnormal   Collection Time: 03/11/24  6:57 AM  Result Value Ref Range   WBC 13.8 (H) 4.0 - 10.5 K/uL   RBC 2.96 (L) 4.22 - 5.81 MIL/uL   Hemoglobin 8.6 (L) 13.0 - 17.0 g/dL   HCT 08.6 (L) 57.8 - 46.9 %   MCV 83.8 80.0 - 100.0 fL   MCH 29.1 26.0 - 34.0 pg  MCHC 34.7 30.0 - 36.0 g/dL   RDW 40.9 81.1 - 91.4 %   Platelets 319 150 - 400 K/uL   nRBC 0.0 0.0 - 0.2 %    Comment: Performed at [facility], [address], [place], [place] [address]  Phosphorus     Status: None   Collection Time: 03/11/24  6:57 AM  Result Value Ref Range   Phosphorus 3.0 2.5 - 4.6 mg/dL    Comment: Performed at [facility], [address] [place], [place] [address]  Heparin  level (unfractionated)     Status: Abnormal   Collection Time: 03/11/24  6:57 AM  Result Value Ref Range   Heparin  Unfractionated 0.14 (L) 0.30 - 0.70 IU/mL    Comment: (NOTE) The clinical reportable range upper limit  is being lowered to >1.10 to align with the FDA approved guidance for the current laboratory assay.  If heparin  results are below expected values, and patient dosage has  been confirmed, suggest follow up testing of antithrombin III levels. Performed at [facility], [address], [place], [place] [address]   Glucose, capillary     Status: Abnormal   Collection Time: 03/11/24  7:34 AM  Result Value Ref Range   Glucose-Capillary 158 (H) 70 - 99 mg/dL    Comment: Glucose reference range applies only to samples taken after fasting for at least 8 hours.  Glucose, capillary     Status: Abnormal   Collection Time: 03/11/24 11:23 AM  Result Value Ref Range   Glucose-Capillary 268 (H) 70 - 99 mg/dL    Comment: Glucose reference range applies only to samples taken after fasting for at least 8 hours.  Glucose, capillary     Status: Abnormal   Collection Time: 03/11/24  4:11 PM  Result Value Ref Range   Glucose-Capillary 240 (H) 70 - 99 mg/dL    Comment: Glucose reference range applies only to samples taken after fasting for at least 8 hours.   *Note: Due to a large number of results and/or encounters for the requested time period, some results have not been displayed. A complete set of results can be found in Results Review.    MICRO: reviewed IMAGING: US  EKG SITE RITE Result Date: 03/11/2024 If Site Rite image not attached, placement could not be confirmed due to current cardiac rhythm.   HISTORICAL MICRO/IMAGING  Assessment/Plan:  polymicrobial sacral wound infection - for the time being we will continue on broad spectrum abtx. - will discuss with his daugther if they would consider doing amoxicillin challenge to clarify his allergy so that we may consider using oral agents treat the remaining course with amox/clav  Continue to off load ; follow wound care instructions for multiple pressure wounds  Will provide length of treatment tomorrow after further discussion with  daughter.  Gerold Kos Levern Reader MD MPH [facility] [phone]

## 2024-03-11 NOTE — Progress Notes (Addendum)
 PHARMACY - ANTICOAGULATION CONSULT NOTE  Pharmacy Consult for heparin   Indication: atrial fibrillation  Allergies  Allergen Reactions   Penicillins Other (See Comments)    Did it involve swelling of the face/tongue/throat, SOB, or low BP? Unknown Did it involve sudden or severe rash/hives, skin peeling, or any reaction on the inside of your mouth or nose? Unknown Did you need to seek medical attention at a hospital or doctor's office? No When did it last happen?    Over 30 Years Ago   If all above answers are "NO", may proceed with cephalosporin use.      Patient Measurements: Height: 5\' 11"  (180.3 cm) Weight: 68.5 kg (151 lb) IBW/kg (Calculated) : 75.3 HEPARIN  DW (KG): 68.5  Vital Signs: Temp: 98.2 F (36.8 C) (04/21 0735) Temp Source: Oral (04/21 0735) BP: 116/60 (04/21 0735) Pulse Rate: 84 (04/21 0735)  Labs: Recent Labs    03/09/24 0832 03/09/24 1844 03/10/24 0555 03/10/24 1752 03/11/24 0657  HGB 9.3*  --  8.7*  --  8.6*  HCT 26.4*  --  25.0*  --  24.8*  PLT 272  --  265  --  319  APTT 45*  --   --   --   --   HEPARINUNFRC 0.18*   < > 0.16* 0.21* 0.14*  CREATININE  --   --  0.80  --   --    < > = values in this interval not displayed.    Estimated Creatinine Clearance: 69 mL/min (by C-G formula based on SCr of 0.8 mg/dL).   Medical History: Past Medical History:  Diagnosis Date   Adenomatous colon polyp    Allergy    Anal fissure    Arthritis    BPH (benign prostatic hyperplasia)    Diabetes mellitus (HCC)    GERD (gastroesophageal reflux disease)    Hyperlipidemia    Hypertension    Hypothyroidism    Other testicular hypofunction    Stroke [facility])    Assessment: Patient admitted with CC of afib with RVR + potential sepsis and hypotension due to UTI vs. Sacral wound. Patient on Eliquis  PTA, held for potential surgical intervention on sacral ulcer. Last dose of Eliquis  on 4/14 @ 08:00am. Pharmacy consulted to dose heparin .   -heparin  level= 0.14  on 1650 units/hr. Per RN report, the infusion was infiltrated -hg= 8.6  Goal of Therapy:  Heparin  level 0.3-0.7 units/ml Monitor platelets by anticoagulation protocol: Yes   Plan:  -heparin  bolus 1500 units x1 then continue 1650 units/hr -Heparin  level in 8 hours -Daily heparin  level and CBC  Baxter Limber, PharmD Clinical Pharmacist **Pharmacist phone directory can now be found on [email or web address] ([email or web address]).  Listed under [facility].

## 2024-03-11 NOTE — Progress Notes (Signed)
 Progress Note     Subjective: Seen with daughter.  No new c/o.  Changing dressing once daily  Objective: Vital signs in last 24 hours: Temp:  [98.2 F (36.8 C)-99.4 F (37.4 C)] 98.2 F (36.8 C) (04/21 0735) Pulse Rate:  [84-88] 84 (04/21 0735) Resp:  [12-19] 18 (04/21 0735) BP: (116-129)/(57-63) 116/60 (04/21 0735) SpO2:  [98 %] 98 % (04/21 0735) Last BM Date : 03/10/24  Intake/Output from previous day: 04/20 0701 - 04/21 0700 In: 772.7 [I.V.:104.6; IV Piggyback:668.1] Out: 2575 [Urine:2575] Intake/Output this shift: No intake/output data recorded.  PE: General: WD, chronically ill appearing male who is laying in bed in NAD MSK: sacral wound overall stable from picture taken last week.  No further purulent drainage.   Lab Results:  Recent Labs    03/10/24 0555 03/11/24 0657  WBC 12.2* 13.8*  HGB 8.7* 8.6*  HCT 25.0* 24.8*  PLT 265 319   BMET Recent Labs    03/10/24 0555  NA 134*  K 4.0  CL 105  CO2 22  GLUCOSE 149*  BUN 16  CREATININE 0.80  CALCIUM  7.4*   PT/INR No results for input(s): "LABPROT", "INR" in the last 72 hours.  CMP     Component Value Date/Time   NA 134 (L) 03/10/2024 0555   NA 141 08/23/2023 1541   NA 140 10/25/2017 0753   K 4.0 03/10/2024 0555   K 4.2 10/25/2017 0753   CL 105 03/10/2024 0555   CL 101 10/23/2012 0802   CO2 22 03/10/2024 0555   CO2 21 (L) 10/25/2017 0753   GLUCOSE 149 (H) 03/10/2024 0555   GLUCOSE 160 (H) 10/25/2017 0753   GLUCOSE 149 (H) 10/23/2012 0802   BUN 16 03/10/2024 0555   BUN 23 08/23/2023 1541   BUN 13.1 10/25/2017 0753   CREATININE 0.80 03/10/2024 0555   CREATININE 1.00 10/31/2023 1601   CREATININE 1.1 10/25/2017 0753   CALCIUM  7.4 (L) 03/10/2024 0555   CALCIUM  9.1 10/25/2017 0753   PROT 4.7 (L) 03/05/2024 0446   PROT 6.0 (L) 10/25/2017 0753   PROT 5.8 (L) 10/25/2017 0752   ALBUMIN  1.9 (L) 03/05/2024 0446   ALBUMIN  3.5 10/25/2017 0753   AST 41 03/05/2024 0446   AST 20 12/22/2021 1013    AST 17 10/25/2017 0753   ALT 31 03/05/2024 0446   ALT 19 12/22/2021 1013   ALT 19 10/25/2017 0753   ALKPHOS 72 03/05/2024 0446   ALKPHOS 85 10/25/2017 0753   BILITOT 0.7 03/05/2024 0446   BILITOT 0.6 12/22/2021 1013   BILITOT 0.48 10/25/2017 0753   GFRNONAA >60 03/10/2024 0555   GFRNONAA >60 12/22/2021 1013   GFRNONAA 67 05/17/2021 1208   GFRAA 77 05/17/2021 1208   Lipase     Component Value Date/Time   LIPASE 40 01/16/2020 1119       Studies/Results: US  EKG SITE RITE Result Date: 03/11/2024 If Site Rite image not attached, placement could not be confirmed due to current cardiac rhythm.   Anti-infectives: Anti-infectives (From admission, onward)    Start     Dose/Rate Route Frequency Ordered Stop   03/07/24 2200  metroNIDAZOLE  (FLAGYL ) tablet 500 mg        500 mg Oral Every 12 hours 03/07/24 0840 03/12/24 0959   03/05/24 1930  vancomycin  (VANCOREADY) IVPB 1250 mg/250 mL  Status:  Discontinued        1,250 mg 166.7 mL/hr over 90 Minutes Intravenous Every 24 hours 03/04/24 2205 03/04/24 2224   03/05/24  1930  Vancomycin  (VANCOCIN ) 1,250 mg in sodium chloride  0.9 % 250 mL IVPB  Status:  Discontinued        1,250 mg 166.7 mL/hr over 90 Minutes Intravenous Every 24 hours 03/04/24 2224 03/05/24 0717   03/05/24 1930  vancomycin  (VANCOREADY) IVPB 1500 mg/300 mL        1,500 mg 150 mL/hr over 120 Minutes Intravenous Every 24 hours 03/05/24 0717     03/05/24 1400  ceFEPIme  (MAXIPIME ) 2 g in sodium chloride  0.9 % 100 mL IVPB        2 g 200 mL/hr over 30 Minutes Intravenous Every 8 hours 03/05/24 0717     03/05/24 0600  ceFEPIme  (MAXIPIME ) 2 g in sodium chloride  0.9 % 100 mL IVPB  Status:  Discontinued        2 g 200 mL/hr over 30 Minutes Intravenous Every 12 hours 03/04/24 2205 03/05/24 0717   03/05/24 0000  metroNIDAZOLE  (FLAGYL ) IVPB 500 mg  Status:  Discontinued        500 mg 100 mL/hr over 60 Minutes Intravenous Every 12 hours 03/04/24 2206 03/04/24 2209   03/04/24 1730   aztreonam (AZACTAM) 2 g in sodium chloride  0.9 % 100 mL IVPB  Status:  Discontinued        2 g 200 mL/hr over 30 Minutes Intravenous  Once 03/04/24 1715 03/04/24 1724   03/04/24 1730  metroNIDAZOLE  (FLAGYL ) IVPB 500 mg  Status:  Discontinued        500 mg 100 mL/hr over 60 Minutes Intravenous Every 12 hours 03/04/24 1715 03/07/24 0840   03/04/24 1730  vancomycin  (VANCOREADY) IVPB 1500 mg/300 mL        1,500 mg 150 mL/hr over 120 Minutes Intravenous  Once 03/04/24 1715 03/04/24 2153   03/04/24 1730  ceFEPIme  (MAXIPIME ) 2 g in sodium chloride  0.9 % 100 mL IVPB        2 g 200 mL/hr over 30 Minutes Intravenous  Once 03/04/24 1724 03/04/24 1826        Assessment/Plan  Sacral decubitus ulcer, stage 4 - wound overall stable with no further signs of purulent drainage -cont current wound care -no plans for further surgical debridement -may resume Eliquis . -we will sign off at this time -above recs relayed to primary service  FEN: reg diet, IVF per TRH  VTE: heparin  gtt ID: cefepime /flagyl /vanc  - per TRH -  Recent COVID Deconditioned  Paroxysmal A. Fib on eliquis  Bradycardia T2DM HTN HLD Hx of CVA with residual R sided weakness GERD BPH Thyroid  disease Amyloid cardiomyopathy HFrEF Chronic pancytopenia   LOS: 7 days   I reviewed hospitalist notes, last 24 h vitals and pain scores, last 48 h intake and output, last 24 h labs and trends, and last 24 h imaging results.   Leone Ralphs, [facility] Surgery 03/11/2024, 11:14 AM Please see Amion for pager number during day hours 7:00am-4:30pm

## 2024-03-11 NOTE — TOC Initial Note (Signed)
 Transition of Care [facility]) - Initial/Assessment Note    Patient Details  Name: Earl [name] MRN: [id number] Date of Birth: Dec 06, 1940  Transition of Care [facility]) CM/SW Contact:    Carmon Christen, LCSWA Phone Number: 03/11/2024, 5:29 PM  Clinical Narrative:                  CSW received consult for possible SNF placement at time of discharge. Patient currently lethargic CSW spoke with patients daughter who was at bedside regarding PT recommendation of SNF placement at time of discharge. Patients daughter informed CSW that PTA that patient comes from [facility] short term. Patients daughter expressed understanding of PT recommendation currently would like [facility] as number one choice [facility] for short term rehab has second choice.CSW discussed insurance authorization process. CSW will follow up with [facility] to see if they can assess patient to see if candidate for [facility].All questions answered.No further questions reported at this time. CSW to continue to follow and assist with discharge planning needs.     Expected Discharge Plan: Skilled Nursing Facility Barriers to Discharge: Continued Medical Work up   Patient Goals and CMS Choice Patient states their goals for this hospitalization and ongoing recovery are:: SNF   Choice offered to / list presented to : Adult Children (patients daughter)      Expected Discharge Plan and Services In-house Referral: Clinical Social Work     Living arrangements for the past 2 months:  (from [facility] short term rehab)                                      Prior Living Arrangements/Services Living arrangements for the past 2 months:  (from [facility] short term rehab) Lives with:: Self Patient language and need for interpreter reviewed:: Yes Do you feel safe going back to the place where you live?: No   [facility] number one choice SNF 2nd choice  Need for Family Participation in Patient Care: Yes (Comment) Care giver support system in  place?: Yes (comment)   Criminal Activity/Legal Involvement Pertinent to Current Situation/Hospitalization: No - Comment as needed  Activities of Daily Living   ADL Screening (condition at time of admission) Independently performs ADLs?: No Does the patient have a NEW difficulty with bathing/dressing/toileting/self-feeding that is expected to last >3 days?: No Does the patient have a NEW difficulty with getting in/out of bed, walking, or climbing stairs that is expected to last >3 days?: No Does the patient have a NEW difficulty with communication that is expected to last >3 days?: No Is the patient deaf or have difficulty hearing?: No Does the patient have difficulty seeing, even when wearing glasses/contacts?: No Does the patient have difficulty concentrating, remembering, or making decisions?: Yes  Permission Sought/Granted Permission sought to share information with : Case Manager, Family Supports, Magazine features editor                Emotional Assessment       Orientation: : Oriented to Self, Oriented to Place, Oriented to  Time, Oriented to Situation (lethargic) Alcohol / Substance Use: Not Applicable Psych Involvement: No (comment)  Admission diagnosis:  Tachycardia [R00.0] Severe sepsis (HCC) [A41.9, R65.20] Sepsis, due to unspecified organism, unspecified whether acute organ dysfunction present [facility]) [A41.9] Patient Active Problem List   Diagnosis Date Noted   Sepsis (HCC) 03/04/2024   Atrial flutter (HCC) 03/04/2024   GERD (gastroesophageal reflux disease) 03/04/2024  Sacral ulcer (HCC) 03/04/2024   History of hypertension 03/04/2024   Severe sepsis (HCC) 03/04/2024   Atrial fibrillation with RVR (HCC) 03/04/2024   Malnutrition of moderate degree 02/27/2024   Syncope, vasovagal 02/16/2024   COVID-19 virus infection 02/15/2024   Acute metabolic encephalopathy 11/14/2023   Failure to thrive in adult 11/14/2023   Generalized weakness 11/14/2023    Fall at home, initial encounter 11/14/2023   Muscle spasms of both lower extremities 11/14/2023   Pressure ulcer 11/14/2023   Chronic systolic CHF (congestive heart failure) (HCC) 09/11/2022   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   Acute on chronic congestive heart failure (HCC) 07/24/2021   Iron deficiency anemia 07/09/2021   Diabetic 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 (HCC) 05/17/2021   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   Acquired thrombophilia (HCC) 09/09/2019   Abdominal aortic atherosclerosis (HCC) by Abd CTscvn  2016 09/09/2019   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   Chronic anticoagulation 09/04/2017   Ataxia, post-stroke    History of CVA (cerebrovascular accident)    Hypothyroidism 09/13/2016   Vitamin D  deficiency 04/29/2014   Insulin  dependent type 2 diabetes mellitus (HCC)    BPH (benign prostatic hyperplasia)    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 [place] [address] Phone: [phone] Fax: [phone]  [facility] - [place], [place] - [address] [place] [address] Phone: [phone] Fax: [phone]  [facility] [place], [place] - [address] [facility] [address] [name] [address] Phone: [phone] Fax: [phone]  [facility] - [place], [place] - [address] [place] [address] Phone: [phone] Fax:  [phone]  [facility] [address] [place] [address] Phone: [phone] Fax: [phone]  [facility] [place], [place] - [address] [place] [address] Phone: [phone] Fax: [phone]  [facility] - [address], [place] - [address] [place] [address] Phone: [phone] Fax: [phone]  [facility] - [place], [place] - [address] [place] [address] [place] [address] [place] [address] Phone: [phone] Fax: [phone]     Social Drivers of Health (SDOH) Social History: SDOH Screenings   Food Insecurity: No Food Insecurity (03/05/2024)  Housing: Low Risk  (03/05/2024)  Transportation Needs: No Transportation Needs (03/05/2024)  Utilities: Not At Risk (03/05/2024)  Depression (PHQ2-9): Low Risk  (10/03/2023)  Financial Resource Strain: Patient Declined (01/16/2024)  Physical Activity: Unknown (01/16/2024)  Social Connections: Moderately Integrated (03/05/2024)  Stress: Stress Concern Present (01/16/2024)  Tobacco Use: Low Risk  (03/04/2024)   SDOH Interventions:     Readmission Risk Interventions     No data to display

## 2024-03-11 NOTE — Plan of Care (Signed)
  Problem: Fluid Volume: Goal: Hemodynamic stability will improve Outcome: Progressing   Problem: Clinical Measurements: Goal: Diagnostic test results will improve Outcome: Progressing Goal: Signs and symptoms of infection will decrease Outcome: Progressing   Problem: Respiratory: Goal: Ability to maintain adequate ventilation will improve Outcome: Progressing   Problem: Education: Goal: Knowledge of risk factors and measures for prevention of condition will improve Outcome: Progressing   Problem: Coping: Goal: Psychosocial and spiritual needs will be supported Outcome: Progressing   Problem: Respiratory: Goal: Will maintain a patent airway Outcome: Progressing Goal: Complications related to the disease process, condition or treatment will be avoided or minimized Outcome: Progressing   Problem: Education: Goal: Ability to describe self-care measures that may prevent or decrease complications (Diabetes Survival Skills Education) will improve Outcome: Progressing Goal: Individualized Educational Video(s) Outcome: Progressing   Problem: Coping: Goal: Ability to adjust to condition or change in health will improve Outcome: Progressing   Problem: Fluid Volume: Goal: Ability to maintain a balanced intake and output will improve Outcome: Progressing   Problem: Health Behavior/Discharge Planning: Goal: Ability to identify and utilize available resources and services will improve Outcome: Progressing Goal: Ability to manage health-related needs will improve Outcome: Progressing   Problem: Metabolic: Goal: Ability to maintain appropriate glucose levels will improve Outcome: Progressing   Problem: Nutritional: Goal: Maintenance of adequate nutrition will improve Outcome: Progressing Goal: Progress toward achieving an optimal weight will improve Outcome: Progressing   Problem: Skin Integrity: Goal: Risk for impaired skin integrity will decrease Outcome: Progressing    Problem: Tissue Perfusion: Goal: Adequacy of tissue perfusion will improve Outcome: Progressing   Problem: Education: Goal: Knowledge of General Education information will improve Description: Including pain rating scale, medication(s)/side effects and non-pharmacologic comfort measures Outcome: Progressing   Problem: Health Behavior/Discharge Planning: Goal: Ability to manage health-related needs will improve Outcome: Progressing   Problem: Clinical Measurements: Goal: Ability to maintain clinical measurements within normal limits will improve Outcome: Progressing Goal: Will remain free from infection Outcome: Progressing Goal: Diagnostic test results will improve Outcome: Progressing Goal: Respiratory complications will improve Outcome: Progressing Goal: Cardiovascular complication will be avoided Outcome: Progressing   Problem: Activity: Goal: Risk for activity intolerance will decrease Outcome: Progressing   Problem: Nutrition: Goal: Adequate nutrition will be maintained Outcome: Progressing   Problem: Coping: Goal: Level of anxiety will decrease Outcome: Progressing   Problem: Elimination: Goal: Will not experience complications related to bowel motility Outcome: Progressing Goal: Will not experience complications related to urinary retention Outcome: Progressing   Problem: Pain Managment: Goal: General experience of comfort will improve and/or be controlled Outcome: Progressing   Problem: Safety: Goal: Ability to remain free from injury will improve Outcome: Progressing   Problem: Skin Integrity: Goal: Risk for impaired skin integrity will decrease Outcome: Progressing

## 2024-03-11 NOTE — Plan of Care (Signed)
   Problem: Fluid Volume: Goal: Hemodynamic stability will improve Outcome: Progressing   Problem: Clinical Measurements: Goal: Diagnostic test results will improve Outcome: Progressing Goal: Signs and symptoms of infection will decrease Outcome: Progressing   Problem: Respiratory: Goal: Ability to maintain adequate ventilation will improve Outcome: Progressing

## 2024-03-12 DIAGNOSIS — Z7189 Other specified counseling: Secondary | ICD-10-CM | POA: Diagnosis not present

## 2024-03-12 DIAGNOSIS — L98429 Non-pressure chronic ulcer of back with unspecified severity: Secondary | ICD-10-CM

## 2024-03-12 DIAGNOSIS — I4891 Unspecified atrial fibrillation: Secondary | ICD-10-CM | POA: Diagnosis not present

## 2024-03-12 DIAGNOSIS — E854 Organ-limited amyloidosis: Secondary | ICD-10-CM | POA: Diagnosis not present

## 2024-03-12 DIAGNOSIS — A419 Sepsis, unspecified organism: Secondary | ICD-10-CM | POA: Diagnosis not present

## 2024-03-12 DIAGNOSIS — D472 Monoclonal gammopathy: Secondary | ICD-10-CM

## 2024-03-12 DIAGNOSIS — L89159 Pressure ulcer of sacral region, unspecified stage: Secondary | ICD-10-CM | POA: Diagnosis not present

## 2024-03-12 DIAGNOSIS — R6520 Severe sepsis without septic shock: Secondary | ICD-10-CM | POA: Diagnosis not present

## 2024-03-12 DIAGNOSIS — Z515 Encounter for palliative care: Secondary | ICD-10-CM | POA: Diagnosis not present

## 2024-03-12 LAB — BASIC METABOLIC PANEL WITH GFR
Anion gap: 4 — ABNORMAL LOW (ref 5–15)
BUN: 17 mg/dL (ref 8–23)
CO2: 23 mmol/L (ref 22–32)
Calcium: 7.5 mg/dL — ABNORMAL LOW (ref 8.9–10.3)
Chloride: 108 mmol/L (ref 98–111)
Creatinine, Ser: 0.79 mg/dL (ref 0.61–1.24)
GFR, Estimated: >60 mL/min (ref >60)
Glucose, Bld: 157 mg/dL — ABNORMAL HIGH (ref 70–99)
Potassium: 4.1 mmol/L (ref 3.5–5.1)
Sodium: 135 mmol/L (ref 135–145)

## 2024-03-12 LAB — CBC
HCT: 24.7 % — ABNORMAL LOW (ref 39.0–52.0)
Hemoglobin: 8.4 g/dL — ABNORMAL LOW (ref 13.0–17.0)
MCH: 28.9 pg (ref 26.0–34.0)
MCHC: 34 g/dL (ref 30.0–36.0)
MCV: 84.9 fL (ref 80.0–100.0)
Platelets: 342 10*3/uL (ref 150–400)
RBC: 2.91 MIL/uL — ABNORMAL LOW (ref 4.22–5.81)
RDW: 14.5 % (ref 11.5–15.5)
WBC: 13 10*3/uL — ABNORMAL HIGH (ref 4.0–10.5)
nRBC: 0 % (ref 0.0–0.2)

## 2024-03-12 LAB — GLUCOSE, CAPILLARY
Glucose-Capillary: 155 mg/dL — ABNORMAL HIGH (ref 70–99)
Glucose-Capillary: 302 mg/dL — ABNORMAL HIGH (ref 70–99)
Glucose-Capillary: 197 mg/dL — ABNORMAL HIGH (ref 70–99)
Glucose-Capillary: 187 mg/dL — ABNORMAL HIGH (ref 70–99)

## 2024-03-12 LAB — PHOSPHORUS: Phosphorus: 2.9 mg/dL (ref 2.5–4.6)

## 2024-03-12 MED ORDER — POLYETHYLENE GLYCOL 3350 17 G PO PACK
17.0000 g | PACK | Freq: Two times a day (BID) | ORAL | Status: DC
  Administered 2024-03-12 – 2024-03-17 (×9): 17 g via ORAL
  Filled 2024-03-12 (×10): qty 1

## 2024-03-12 MED ORDER — BISACODYL 10 MG RE SUPP
10.0000 mg | Freq: Once | RECTAL | Status: AC
  Administered 2024-03-12: 10 mg via RECTAL
  Filled 2024-03-12: qty 1

## 2024-03-12 MED ORDER — POLYETHYLENE GLYCOL 3350 17 G PO PACK: 17.0000 g | PACK | Freq: Every day | ORAL | Status: DC

## 2024-03-12 MED ORDER — CEFADROXIL 500 MG PO CAPS
1000.0000 mg | ORAL_CAPSULE | Freq: Two times a day (BID) | ORAL | Status: DC
  Administered 2024-03-13 – 2024-03-16 (×7): 1000 mg via ORAL
  Filled 2024-03-12 (×8): qty 2

## 2024-03-12 MED ORDER — OXYCODONE HCL ER 10 MG PO T12A
10.0000 mg | EXTENDED_RELEASE_TABLET | Freq: Two times a day (BID) | ORAL | Status: DC
  Administered 2024-03-12 – 2024-04-20 (×76): 10 mg via ORAL
  Filled 2024-03-12 (×78): qty 1

## 2024-03-12 NOTE — NC FL2 (Signed)
 [place]  MEDICAID FL2 LEVEL OF CARE FORM     IDENTIFICATION  Patient Name: Earl [name] Birthdate: Sep 10, 1941 Sex: male Admission Date (Current Location): 03/04/2024  [facility] and [place] Number:  Producer, television/film/video and Address:  [facility] [place], [address], [place], [place] [address]      Provider Number: [id number]  Attending Physician Name and Address:  Leona Rake, MD  Relative Name and Phone Number:  [name] (daughter) [phone]    Current Level of Care: Hospital Recommended Level of Care: Skilled Nursing Facility Prior Approval Number:    Date Approved/Denied:   PASRR Number: [id number]  Discharge Plan: SNF    Current Diagnoses: Patient Active Problem List   Diagnosis Date Noted   Sepsis (HCC) 03/04/2024   Atrial flutter (HCC) 03/04/2024   GERD (gastroesophageal reflux disease) 03/04/2024   Sacral ulcer (HCC) 03/04/2024   History of hypertension 03/04/2024   Severe sepsis (HCC) 03/04/2024   Atrial fibrillation with RVR (HCC) 03/04/2024   Malnutrition of moderate degree 02/27/2024   Syncope, vasovagal 02/16/2024   COVID-19 virus infection 02/15/2024   Acute metabolic encephalopathy 11/14/2023   Failure to thrive in adult 11/14/2023   Generalized weakness 11/14/2023   Fall at home, initial encounter 11/14/2023   Muscle spasms of both lower extremities 11/14/2023   Pressure ulcer 11/14/2023   Chronic systolic CHF (congestive heart failure) (HCC) 09/11/2022   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   Acute on chronic congestive heart failure (HCC) 07/24/2021   Iron deficiency anemia 07/09/2021   Diabetic 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 (HCC) 05/17/2021   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   Acquired thrombophilia (HCC) 09/09/2019   Abdominal aortic atherosclerosis (HCC) by Abd CTscvn  2016 09/09/2019   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   Chronic anticoagulation 09/04/2017    Ataxia, post-stroke    History of CVA (cerebrovascular accident)    Hypothyroidism 09/13/2016   Vitamin D  deficiency 04/29/2014   Insulin  dependent type 2 diabetes mellitus (HCC)    BPH (benign prostatic hyperplasia)    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 LDAs,PI hip,ant.,L,toe ant.R,DT,PI shoulder,L,DT,PI coccyx midunstageable,Wound/Incis.n open or dehisced skin tear,elbow,ant.,L,Wound/Incis.open or dehisced irritant dermatitis MASD sacrum,R,L)                       Personal Care Assistance Level of Assistance    Bathing Assistance: Maximum assistance Feeding assistance: Limited assistance Dressing Assistance: Maximum assistance     Functional Limitations Info  Sight, Hearing, Speech Sight Info: Impaired Hearing Info: Impaired Speech Info: Adequate    SPECIAL CARE FACTORS FREQUENCY  PT (By licensed PT), OT (By licensed OT)     PT Frequency: 5x min weekly OT Frequency: 5x min weekly            Contractures Contractures Info: Not present    Additional Factors Info  Code Status, Isolation Precautions, Allergies, Insulin  Sliding Scale Code Status Info: DNR Allergies Info:  Penicillins   Insulin  Sliding Scale Info: insulin  aspart (novoLOG ) injection 0-15 Units 3 times daily with meals,insulin  aspart (novoLOG ) injection 0-5 Units daily at bedtime,insulin   aspart (novoLOG ) injection 3 Units 3 times daily with meals,insulin  glargine-yfgn (SEMGLEE ) injection 15 Units daily Isolation Precautions Info: Covid+     Current Medications (03/12/2024):  This is the current hospital active medication list Current Facility-Administered Medications  Medication Dose Route Frequency Provider Last Rate Last Admin   acetaminophen  (TYLENOL ) tablet 650 mg  650 mg Oral Q6H PRN Sundil, Subrina, MD   650 mg at 03/09/24 2148   Or   acetaminophen  (TYLENOL ) suppository 650 mg  650 mg Rectal Q6H PRN Sundil, Subrina, MD       acetaminophen  (TYLENOL ) tablet 500 mg  500 mg Oral TID Ferolito, Michelle Y, NP   500 mg at 03/12/24 0532   amiodarone  (PACERONE ) tablet 200 mg  200 mg Oral BID Harrold Lincoln, MD   200 mg at 03/12/24 0865   Followed by   Cecily Cohen ON 03/17/2024] amiodarone  (PACERONE ) tablet 200 mg  200 mg Oral Daily O'Neal, Cathay Clonts, MD       apixaban  (ELIQUIS ) tablet 5 mg  5 mg Oral BID Leona Rake, MD   5 mg at 03/12/24 0930   ascorbic acid  (VITAMIN C ) tablet 500 mg  500 mg Oral BID Leona Rake, MD   500 mg at 03/12/24 0930   atorvastatin  (LIPITOR) tablet 40 mg  40 mg Oral Daily Sundil, Subrina, MD   40 mg at 03/12/24 0930   [START ON 03/13/2024] cefadroxil  (DURICEF) capsule 1,000 mg  1,000 mg Oral BID Liane Redman, MD       Chlorhexidine  Gluconate Cloth 2 % PADS 6 each  6 each Topical Daily Leona Rake, MD   6 each at 03/12/24 1139   collagenase  (SANTYL ) ointment   Topical Daily Sundil, Subrina, MD   Given at 03/12/24 1414   feeding supplement (GLUCERNA SHAKE) (GLUCERNA SHAKE) liquid 237 mL  237 mL Oral TID BM Adhikari, Amrit, MD   237 mL at 03/12/24 1004   ferrous sulfate  tablet 325 mg  325 mg Oral QHS Sundil, Subrina, MD   325 mg at 03/11/24 2258   HYDROmorphone  (DILAUDID ) injection 0.25 mg  0.25 mg Intravenous Q3H PRN Leona Rake, MD   0.25 mg at 03/12/24 1410   insulin  aspart (novoLOG ) injection 0-15 Units  0-15 Units Subcutaneous TID  WC Leona Rake, MD   11 Units at 03/12/24 1239   insulin  aspart (novoLOG ) injection 0-5 Units  0-5 Units Subcutaneous QHS Leona Rake, MD   2 Units at 03/09/24 2147   insulin  aspart (novoLOG ) injection 3 Units  3 Units Subcutaneous TID WC Adhikari, Amrit, MD   3 Units at 03/12/24 1239   insulin  glargine-yfgn (SEMGLEE ) injection 15 Units  15 Units Subcutaneous Daily Leona Rake, MD   15 Units at 03/12/24 0943   leptospermum manuka honey (MEDIHONEY) paste 1 Application  1 Application Topical Daily Leona Rake, MD   1 Application at 03/12/24 1458   levothyroxine  (SYNTHROID ) tablet 50 mcg  50 mcg Oral Q0600 Sundil, Subrina, MD   50 mcg at 03/12/24 0532   lip balm (CARMEX) ointment 1 Application  1 Application Topical PRN Leona Rake, MD       liver oil-zinc  oxide (DESITIN) 40 % ointment   Topical BID Sundil, Subrina, MD   Given at 03/12/24 1414   midodrine  (PROAMATINE ) tablet 5 mg  5 mg Oral TID WC Leona Rake, MD  5 mg at 03/12/24 1240   ondansetron  (ZOFRAN ) tablet 4 mg  4 mg Oral Q6H PRN Sundil, Subrina, MD       Or   ondansetron  (ZOFRAN ) injection 4 mg  4 mg Intravenous Q6H PRN Sundil, Subrina, MD       oxyCODONE  (Oxy IR/ROXICODONE ) immediate release tablet 5 mg  5 mg Oral BID PRN Johnson, Kelly R, PA-C   5 mg at 03/11/24 1551   phosphorus (K PHOS  NEUTRAL) tablet 500 mg  500 mg Oral TID Adhikari, Amrit, MD   500 mg at 03/12/24 2130   polyethylene glycol (MIRALAX  / GLYCOLAX ) packet 17 g  17 g Oral BID Leona Rake, MD       senna-docusate (Senokot-S) tablet 1 tablet  1 tablet Oral BID Leona Rake, MD   1 tablet at 03/12/24 0930   sodium chloride  flush (NS) 0.9 % injection 3 mL  3 mL Intravenous Q12H Sundil, Subrina, MD   3 mL at 03/12/24 8657   sodium chloride  flush (NS) 0.9 % injection 3 mL  3 mL Intravenous PRN Sundil, Subrina, MD       Tafamidis  CAPS 61 mg  1 capsule Oral Daily Leona Rake, MD   61 mg at 03/12/24 8469   zinc  sulfate (50mg  elemental zinc )  capsule 220 mg  220 mg Oral Daily Adhikari, Amrit, MD   220 mg at 03/12/24 0930     Discharge Medications: Please see discharge summary for a list of discharge medications.  Relevant Imaging Results:  Relevant Lab Results:   Additional Information [id number]  Carmon Christen, LCSWA

## 2024-03-12 NOTE — Plan of Care (Signed)
  Problem: Fluid Volume: Goal: Hemodynamic stability will improve Outcome: Progressing   Problem: Respiratory: Goal: Ability to maintain adequate ventilation will improve Outcome: Progressing   Problem: Education: Goal: Knowledge of General Education information will improve Description: Including pain rating scale, medication(s)/side effects and non-pharmacologic comfort measures Outcome: Progressing   Problem: Activity: Goal: Risk for activity intolerance will decrease Outcome: Not Progressing   Problem: Pain Managment: Goal: General experience of comfort will improve and/or be controlled Outcome: Not Progressing   Problem: Safety: Goal: Ability to remain free from injury will improve Outcome: Progressing   Problem: Skin Integrity: Goal: Risk for impaired skin integrity will decrease Outcome: Not Progressing

## 2024-03-12 NOTE — Progress Notes (Signed)
 PROGRESS NOTE  Earl [name]  ZHY:[id number] DOB: 21-Oct-1941 DOA: 03/04/2024 PCP: [name], MD   Brief Narrative: Patient is a 83 year old male with history of paroxysmal A-fib, insulin -dependent diabetes, hypertension, hyperlipidemia, CVA with residual right-sided weakness, MGUS, GERD, BPH, hypothyroidism, HFrEF with EF of 40 to 45%, chronic pancytopenia who presented to the emergency department from SNF with complaint of shortness of breath, wound, pain in the sacral area.  He was just discharged from here to [facility] rehab 4 days ago.  Hospital course was remarkable for acute metabolic encephalopathy, hypotension, hypoglycemia.  On presentation, he was hypotensive, EKG showed A-fib with RVR, tachypnea but saturating fine on room air.  Respiratory panel negative for COVID.  PCCM consulted for persistent hypotension.  Cardiology also consulted.  General surgery consulted for sacral ulcer.  Started on broad-spectrum antibiotics, amiodarone  drip.  Status post bedside excisional debridement of the sacral wound on 4/15 and 4/17.  Wound care, cardiology, general surgery following.  No plan for further operative intervention for sacral ulcer.  ID consulted for assistance with antibiotics.  Plan for SNF  Assessment & Plan:  Principal Problem:   Severe sepsis (HCC) Active Problems:   Paroxysmal atrial fibrillation (HCC)   Sepsis (HCC)   Atrial flutter (HCC)   Sacral ulcer (HCC)   MGUS (monoclonal gammopathy of unknown significance)   Essential hypertension   Hyperlipidemia   Insulin  dependent type 2 diabetes mellitus (HCC)   History of CVA (cerebrovascular accident)   Cardiac amyloidosis (HCC)   Chronic systolic CHF (congestive heart failure) (HCC)   GERD (gastroesophageal reflux disease)   History of hypertension   Atrial fibrillation with RVR (HCC)  Sepsis secondary to sacral pressure wound/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  Hypotensive, encephalopathic, hypoglycemic, elevated lactate, leukocytosis on presentation.  Started on IV fluid, broad spectrum antibiotics.  Follow-up cultures:NGTD Code sepsis was activated.  PCCM recommended to continue midodrine . Blood pressure has been better now with systolic in the range of 100s.  Currently he is alert and mostly oriented.  Chronic sacral wound with suspicion of infection: CT chest/abdomen/pelvis showed sacral decubitus wound with extension to the right gluteus muscle, no definitive bony lesions,no signs of osteomyelitis.  General surgery consulted,   Continue current antibiotics.  PT consulted for hydrotherapy.  Continue zinc , vitamin C .Status post bedside excisional debridement of the sacral wound on 4/15 and 4/17.  Observed to have purulent drainage.  Surgery recommended to continue twice daily wet-to-dry dressing changes with Dakin's.  General surgery not planning for further surgical debridement.  Wound care nurse following for other pressure ulcers.  Needs to continue wound care on discharge.  ID will give us  recommendation for antibiotics for discharge  History of paroxysmal A-fib: Presented with A-fib with RVR.  Toprol  was discontinued on last admission.  Started on amiodarone  drip.  Cardiology following.  Eliquis  on hold for possible need of debridement, on heparin  drip.  He remains in normal sinus rhythm with controlled heart rate today.  Digoxin  discontinued.  Amiodarone  changed to oral.  Eliquis  will be restarted  History of HFrEF/transthyretin cardiac amyloidosis/nonischemic cardiomyopathy/hypotension: Presented with hypotension.  Takes Entresto , Toprol  at home,. Currently on hold.  Given IV fluids initially.    Cardiology team was following,continue midodrine   Insulin -dependent diabetes type 2: Presented with hyperglycemia.  Recent A1c in the range of 8.  Diabetic coordinator consulted.  Currently on sliding  scale, Lantus   History of CVA:  Has a right-sided residual weakness.  On Eliquis , Lipitor.  GERD: Continue Protonix  Anemia of chronic disease: Continue iron supplementation  Hypothyroidism: Continue levothyroxine   Recent history of COVID: COVID was positive on 02/14/2024.  Still positive on this admission.  Out of 21 days window  Severe protein calorie malnutrition/suspicion for dysphagia/hypophosphatemia: Speech therapy consulted.  Phosphorus being  supplemented and corrected.  Dietitian was consulted.  Constipation: Continue bowel regimen.  Had a small bowel movement yesterday.  Adding dulcolax suppository today  Goals of care: Very deconditioned elderly male with bedbound status, multiple comorbidities, sacral pressure ulcer.  CODE STATUS DNR.  Palliative care consulted for goals of care.  DNR.  PT/OT consulted.  Recommended SNF on discharge.  TOC following   Nutrition Problem: Inadequate oral intake Etiology: decreased appetite Pressure Injury 02/26/24 Coccyx Mid Unstageable - Full thickness tissue loss in which the base of the injury is covered by slough (yellow, tan, gray, green or brown) and/or eschar (tan, brown or black) in the wound bed. (Active)  02/26/24 0916  Location: Coccyx  Location Orientation: Mid  Staging: Unstageable - Full thickness tissue loss in which the base of the injury is covered by slough (yellow, tan, gray, green or brown) and/or eschar (tan, brown or black) in the wound bed.  Wound Description (Comments):   Present on Admission: No  Dressing Type Foam - Lift dressing to assess site every shift 03/11/24 2300     Pressure Injury 02/26/24 Toe (Comment  which one) Anterior;Right Deep Tissue Pressure Injury - Purple or maroon localized area of discolored intact skin or blood-filled blister due to damage of underlying soft tissue from pressure and/or shear. right gre (Active)  02/26/24 1215  Location: Toe (Comment  which one)  Location Orientation:  Anterior;Right  Staging: Deep Tissue Pressure Injury - Purple or maroon localized area of discolored intact skin or blood-filled blister due to damage of underlying soft tissue from pressure and/or shear.  Wound Description (Comments): right great toe  Present on Admission:   Dressing Type Foam - Lift dressing to assess site every shift 03/11/24 2300     Pressure Injury 03/06/24 Hip Anterior;Left;Proximal Deep Tissue Pressure Injury - Purple or maroon localized area of discolored intact skin or blood-filled blister due to damage of underlying soft tissue from pressure and/or shear. Fluid filled blister w (Active)  03/06/24 1612  Location: Hip  Location Orientation: Anterior;Left;Proximal  Staging: Deep Tissue Pressure Injury - Purple or maroon localized area of discolored intact skin or blood-filled blister due to damage of underlying soft tissue from pressure and/or shear.  Wound Description (Comments): Fluid filled blister with purple discoloration  Present on Admission: No  Dressing Type Foam - Lift dressing to assess site every shift 03/11/24 2300     Pressure Injury 03/06/24 Shoulder Left Deep Tissue Pressure Injury - Purple or maroon localized area of discolored intact skin or blood-filled blister due to damage of underlying soft tissue from pressure and/or shear. dark purple discoloration to should (Active)  03/06/24 1613  Location: Shoulder  Location Orientation: Left  Staging: Deep Tissue Pressure Injury - Purple or maroon localized area of discolored intact skin or blood-filled blister due to damage of underlying soft tissue from pressure and/or shear.  Wound Description (Comments): dark purple discoloration to shoulder  Present on Admission: No  Dressing Type Foam - Lift dressing to assess site every shift 03/11/24 2300    DVT prophylaxis:SCDs Start: 03/04/24 2151 Place TED hose Start: 03/04/24 2151 apixaban  (ELIQUIS ) tablet 5 mg  Code Status: Limited: Do not attempt  resuscitation (DNR) -DNR-LIMITED -Do Not Intubate/DNI   Family Communication: Daughter at bedside on 4/22  Patient status:Inpatient  Patient is from :home  Anticipated discharge to:SNF   Estimated DC date:2-3 days   Consultants:Cardiology,pccm ,surgery  Procedures:None yet  Antimicrobials:  Anti-infectives (From admission, onward)    Start     Dose/Rate Route Frequency Ordered Stop   03/07/24 2200  metroNIDAZOLE  (FLAGYL ) tablet 500 mg        500 mg Oral Every 12 hours 03/07/24 0840 03/11/24 2258   03/05/24 1930  vancomycin  (VANCOREADY) IVPB 1250 mg/250 mL  Status:  Discontinued        1,250 mg 166.7 mL/hr over 90 Minutes Intravenous Every 24 hours 03/04/24 2205 03/04/24 2224   03/05/24 1930  Vancomycin  (VANCOCIN ) 1,250 mg in sodium chloride  0.9 % 250 mL IVPB  Status:  Discontinued        1,250 mg 166.7 mL/hr over 90 Minutes Intravenous Every 24 hours 03/04/24 2224 03/05/24 0717   03/05/24 1930  vancomycin  (VANCOREADY) IVPB 1500 mg/300 mL        1,500 mg 150 mL/hr over 120 Minutes Intravenous Every 24 hours 03/05/24 0717     03/05/24 1400  ceFEPIme  (MAXIPIME ) 2 g in sodium chloride  0.9 % 100 mL IVPB        2 g 200 mL/hr over 30 Minutes Intravenous Every 8 hours 03/05/24 0717     03/05/24 0600  ceFEPIme  (MAXIPIME ) 2 g in sodium chloride  0.9 % 100 mL IVPB  Status:  Discontinued        2 g 200 mL/hr over 30 Minutes Intravenous Every 12 hours 03/04/24 2205 03/05/24 0717   03/05/24 0000  metroNIDAZOLE  (FLAGYL ) IVPB 500 mg  Status:  Discontinued        500 mg 100 mL/hr over 60 Minutes Intravenous Every 12 hours 03/04/24 2206 03/04/24 2209   03/04/24 1730  aztreonam (AZACTAM) 2 g in sodium chloride  0.9 % 100 mL IVPB  Status:  Discontinued        2 g 200 mL/hr over 30 Minutes Intravenous  Once 03/04/24 1715 03/04/24 1724   03/04/24 1730  metroNIDAZOLE  (FLAGYL ) IVPB 500 mg  Status:  Discontinued        500 mg 100 mL/hr over 60 Minutes Intravenous Every 12 hours 03/04/24 1715  03/07/24 0840   03/04/24 1730  vancomycin  (VANCOREADY) IVPB 1500 mg/300 mL        1,500 mg 150 mL/hr over 120 Minutes Intravenous  Once 03/04/24 1715 03/04/24 2153   03/04/24 1730  ceFEPIme  (MAXIPIME ) 2 g in sodium chloride  0.9 % 100 mL IVPB        2 g 200 mL/hr over 30 Minutes Intravenous  Once 03/04/24 1724 03/04/24 1826       Subjective: Patient seen and examined at the bedside today.  Comfortable lying in bed.  Does not appear to be in acute distress.  EKG monitor showed normal sinus rhythm with a stable heart rate and blood pressure.  Daughter says that he was complaining of severe pain in the sacral area this morning.  Had a small bowel movement yesterday.  Abdomen is not tender or distended.  Objective: Vitals:   03/11/24 1500 03/11/24 2100 03/12/24 0500 03/12/24 0823  BP: 132/62 (!) 111/50 (!) 112/58 134/70  Pulse: 93   89  Resp: 20   (!) 22  Temp: 98.9 F (37.2 C) 98.7 F (37.1 C) 98.6 F (37 C)   TempSrc: Oral  Oral Oral   SpO2: 96%   98%  Weight:      Height:        Intake/Output Summary (Last 24 hours) at 03/12/2024 1134 Last data filed at 03/12/2024 0943 Gross per 24 hour  Intake 1840.63 ml  Output 2275 ml  Net -434.37 ml   Filed Weights   03/04/24 1656  Weight: 68.5 kg    Examination:    General exam: Overall comfortable, not in distress, very deconditioned, chronically ill looking, weak HEENT: PERRL Respiratory system:  no wheezes or crackles  Cardiovascular system: S1 & S2 heard, RRR.  Gastrointestinal system: Abdomen is nondistended, soft and nontender. Central nervous system: Alert and oriented Extremities: No edema, no clubbing ,no cyanosis Skin: Scattered pressure ulcers, sacral pressure ulcer   Data Reviewed: I have personally reviewed following labs and imaging studies  CBC: Recent Labs  Lab 03/08/24 0445 03/09/24 0832 03/10/24 0555 03/11/24 0657 03/12/24 0652  WBC 12.5* 10.2 12.2* 13.8* 13.0*  HGB 9.5* 9.3* 8.7* 8.6* 8.4*  HCT  27.4* 26.4* 25.0* 24.8* 24.7*  MCV 83.8 83.3 83.6 83.8 84.9  PLT 302 272 265 319 342   Basic Metabolic Panel: Recent Labs  Lab 03/05/24 1412 03/06/24 0501 03/07/24 1610 03/08/24 0445 03/09/24 0832 03/10/24 0555 03/11/24 0657 03/12/24 0652  NA  --  136  --  134*  --  134*  --  135  K  --  4.1  --  3.7  --  4.0  --  4.1  CL  --  109  --  101  --  105  --  108  CO2  --  20*  --  21*  --  22  --  23  GLUCOSE  --  166*  --  160*  --  149*  --  157*  BUN  --  16  --  16  --  16  --  17  CREATININE  --  0.87  --  0.86  --  0.80  --  0.79  CALCIUM   --  7.8*  --  7.4*  --  7.4*  --  7.5*  MG 1.7 1.7  --   --   --   --   --   --   PHOS  --  2.1*   < > 2.1* 2.3* 2.0* 3.0 2.9   < > = values in this interval not displayed.     Recent Results (from the past 240 hours)  Resp panel by RT-PCR (RSV, Flu A&B, Covid) Anterior Nasal Swab     Status: Abnormal   Collection Time: 03/04/24  5:38 PM   Specimen: Anterior Nasal Swab  Result Value Ref Range Status   SARS Coronavirus 2 by RT PCR POSITIVE (A) NEGATIVE Final   Influenza A by PCR NEGATIVE NEGATIVE Final   Influenza B by PCR NEGATIVE NEGATIVE Final    Comment: (NOTE) The Xpert Xpress SARS-CoV-2/FLU/RSV plus assay is intended as an aid in the diagnosis of influenza from Nasopharyngeal swab specimens and should not be used as a sole basis for treatment. Nasal washings and aspirates are unacceptable for Xpert Xpress SARS-CoV-2/FLU/RSV testing.  Fact Sheet for Patients: BloggerCourse.com  Fact Sheet for Healthcare Providers: SeriousBroker.it  This test is not yet approved or cleared by the [place]  FDA and has been authorized for detection and/or diagnosis of SARS-CoV-2 by FDA under an Emergency Use Authorization (EUA). This EUA will remain in effect (meaning this test can be used) for the duration  of the COVID-19 declaration under Section 564(b)(1) of the Act, 21 U.S.C. section  360bbb-3(b)(1), unless the authorization is terminated or revoked.     Resp Syncytial Virus by PCR NEGATIVE NEGATIVE Final    Comment: (NOTE) Fact Sheet for Patients: BloggerCourse.com  Fact Sheet for Healthcare Providers: SeriousBroker.it  This test is not yet approved or cleared by the [place]  FDA and has been authorized for detection and/or diagnosis of SARS-CoV-2 by FDA under an Emergency Use Authorization (EUA). This EUA will remain in effect (meaning this test can be used) for the duration of the COVID-19 declaration under Section 564(b)(1) of the Act, 21 U.S.C. section 360bbb-3(b)(1), unless the authorization is terminated or revoked.  Performed at [facility], [address], [place], [place] [address]   Culture, blood (x 2)     Status: None   Collection Time: 03/04/24  5:44 PM   Specimen: BLOOD LEFT HAND  Result Value Ref Range Status   Specimen Description BLOOD LEFT HAND  Final   Special Requests   Final    BOTTLES DRAWN AEROBIC ONLY Blood Culture adequate volume   Culture   Final    NO GROWTH 5 DAYS Performed at [facility], [address] [place], [place] [address]    Report Status 03/09/2024 FINAL  Final  Culture, blood (x 2)     Status: None   Collection Time: 03/04/24  6:14 PM   Specimen: BLOOD RIGHT HAND  Result Value Ref Range Status   Specimen Description BLOOD RIGHT HAND  Final   Special Requests   Final    BOTTLES DRAWN AEROBIC ONLY Blood Culture results may not be optimal due to an inadequate volume of blood received in culture bottles   Culture   Final    NO GROWTH 5 DAYS Performed at [facility], [address] [place], [place] [address]    Report Status 03/09/2024 FINAL  Final     Radiology Studies: US  EKG SITE RITE Result Date: 03/11/2024 If Site Rite image not attached, placement could not be confirmed due to current cardiac rhythm.    Scheduled Meds:   acetaminophen   500 mg Oral TID   amiodarone   200 mg Oral BID   Followed by   Cecily Cohen ON 03/17/2024] amiodarone   200 mg Oral Daily   apixaban   5 mg Oral BID   ascorbic acid   500 mg Oral BID   atorvastatin   40 mg Oral Daily   bisacodyl   10 mg Rectal Once   Chlorhexidine  Gluconate Cloth  6 each Topical Daily   collagenase    Topical Daily   feeding supplement (GLUCERNA SHAKE)  237 mL Oral TID BM   ferrous sulfate   325 mg Oral QHS   insulin  aspart  0-15 Units Subcutaneous TID WC   insulin  aspart  0-5 Units Subcutaneous QHS   insulin  aspart  3 Units Subcutaneous TID WC   insulin  glargine-yfgn  15 Units Subcutaneous Daily   leptospermum manuka honey  1 Application Topical Daily   levothyroxine   50 mcg Oral Q0600   liver oil-zinc  oxide   Topical BID   midodrine   5 mg Oral TID WC   phosphorus  500 mg Oral TID   polyethylene glycol  17 g Oral BID   senna-docusate  1 tablet Oral BID   sodium chloride  flush  3 mL Intravenous Q12H   Tafamidis   1 capsule Oral Daily   zinc  sulfate (50mg  elemental zinc )  220 mg Oral Daily  Continuous Infusions:  ceFEPime  (MAXIPIME ) IV Stopped (03/12/24 0615)   vancomycin  Stopped (03/11/24 2259)     LOS: 8 days   Leona Rake, MD [facility] P4/22/2025, 11:34 AM

## 2024-03-12 NOTE — TOC Progression Note (Addendum)
 Transition of Care [facility]) - Progression Note    Patient Details  Name: Earl [name] MRN: [id number] Date of Birth: 1941-06-23  [facility] CM/SW Contact  Carmon Christen, LCSWA Phone Number: 03/12/2024, 2:13 PM  Clinical Narrative:     CSW informed Rice Chamorro with inpatient rehab and PT that patients daughter is requesting [facility] as first choice for rehab for patient and SNF as second choice. CSW will continue to follow.  Update- CSW spoke with patients daughter and informed her that CSW reached out to [facility] and that Rice Chamorro with  [facility] informed CSW that patient currently doesn't have medical necessity or that insurance will approve for CIR.Patients daughter gave CSW permission to fax out to other facility's for SNF to see what options are for SNF. CSW will follow up with patients daughter tomorrow with SNF bed offers.  Expected Discharge Plan: Skilled Nursing Facility Barriers to Discharge: Continued Medical Work up  Expected Discharge Plan and Services In-house Referral: Clinical Social Work     Living arrangements for the past 2 months:  (from [facility] short term rehab)                                       Social Determinants of Health (SDOH) Interventions SDOH Screenings   Food Insecurity: No Food Insecurity (03/05/2024)  Housing: Low Risk  (03/05/2024)  Transportation Needs: No Transportation Needs (03/05/2024)  Utilities: Not At Risk (03/05/2024)  Depression (PHQ2-9): Low Risk  (10/03/2023)  Financial Resource Strain: Patient Declined (01/16/2024)  Physical Activity: Unknown (01/16/2024)  Social Connections: Moderately Integrated (03/05/2024)  Stress: Stress Concern Present (01/16/2024)  Tobacco Use: Low Risk  (03/04/2024)    Readmission Risk Interventions     No data to display

## 2024-03-12 NOTE — Plan of Care (Signed)
 Transitioned to oral antibiotics. Palliative Medicine Team involved. New pain regimen ordered.  Problem: Fluid Volume: Goal: Hemodynamic stability will improve Outcome: Progressing   Problem: Clinical Measurements: Goal: Diagnostic test results will improve Outcome: Progressing Goal: Signs and symptoms of infection will decrease Outcome: Progressing   Problem: Respiratory: Goal: Ability to maintain adequate ventilation will improve Outcome: Progressing

## 2024-03-12 NOTE — Progress Notes (Signed)
 [facility]    Date of Admission:  03/04/2024   Total days of antibiotics 8   ID: Earl [name] is a 83 y.o. male with   Principal Problem:   Severe sepsis (HCC) Active Problems:   Paroxysmal atrial fibrillation (HCC)   MGUS (monoclonal gammopathy of unknown significance)   Essential hypertension   Hyperlipidemia   Insulin  dependent type 2 diabetes mellitus (HCC)   History of CVA (cerebrovascular accident)   Cardiac amyloidosis (HCC)   Chronic systolic CHF (congestive heart failure) (HCC)   Sepsis (HCC)   Atrial flutter (HCC)   GERD (gastroesophageal reflux disease)   Sacral ulcer (HCC)   History of hypertension   Atrial fibrillation with RVR (HCC)    Subjective: Afebrile, still having pain at buttock from wound/ now left leaning. Did have wound therapy today. Some improvement but still some nonviable tissue exudate in the wound bed. See note and photo in media  Medications:   acetaminophen   500 mg Oral TID   amiodarone   200 mg Oral BID   Followed by   Cecily Cohen ON 03/17/2024] amiodarone   200 mg Oral Daily   apixaban   5 mg Oral BID   ascorbic acid   500 mg Oral BID   atorvastatin   40 mg Oral Daily   Chlorhexidine  Gluconate Cloth  6 each Topical Daily   collagenase    Topical Daily   feeding supplement (GLUCERNA SHAKE)  237 mL Oral TID BM   ferrous sulfate   325 mg Oral QHS   insulin  aspart  0-15 Units Subcutaneous TID WC   insulin  aspart  0-5 Units Subcutaneous QHS   insulin  aspart  3 Units Subcutaneous TID WC   insulin  glargine-yfgn  15 Units Subcutaneous Daily   leptospermum manuka honey  1 Application Topical Daily   levothyroxine   50 mcg Oral Q0600   liver oil-zinc  oxide   Topical BID   midodrine   5 mg Oral TID WC   phosphorus  500 mg Oral TID   polyethylene glycol  17 g Oral BID   senna-docusate  1 tablet Oral BID   sodium chloride  flush  3 mL Intravenous Q12H   Tafamidis   1 capsule Oral Daily   zinc  sulfate (50mg  elemental zinc )   220 mg Oral Daily    Objective: Vital signs in last 24 hours: Temp:  [98.5 F (36.9 C)-98.7 F (37.1 C)] 98.5 F (36.9 C) (04/22 1604) Pulse Rate:  [89] 89 (04/22 0823) Resp:  [22] 22 (04/22 0823) BP: (111-134)/(50-70) 134/67 (04/22 1138) SpO2:  [98 %] 98 % (04/22 0823)  Physical Exam  Constitutional: He is oriented to person, place, and time. He appears chronically ill and mal-nourished. No distress.  HENT:  Mouth/Throat: Oropharynx is clear and moist. No oropharyngeal exudate.  Cardiovascular: Normal rate, regular rhythm and normal heart sounds. Exam reveals no gallop and no friction rub.  No murmur heard.  Pulmonary/Chest: Effort normal and breath sounds normal. No respiratory distress. He has no wheezes.  Abdominal: Soft. Bowel sounds are normal. He exhibits no distension. There is no tenderness.  Lymphadenopathy:  He has no cervical adenopathy.  Neurological: He is alert and oriented to person, place, and time.  Skin: Skin is warm and dry. No rash noted. No erythema.  Psychiatric: He has a normal mood and affect. His behavior is normal.    Lab Results Recent Labs    03/10/24 0555 03/11/24 0657 03/12/24 0652  WBC 12.2* 13.8* 13.0*  HGB 8.7* 8.6* 8.4*  HCT  25.0* 24.8* 24.7*  NA 134*  --  135  K 4.0  --  4.1  CL 105  --  108  CO2 22  --  23  BUN 16  --  17  CREATININE 0.80  --  0.79   Liver Panel No results for input(s): "PROT", "ALBUMIN ", "AST", "ALT", "ALKPHOS", "BILITOT", "BILIDIR", "IBILI" in the last 72 hours. Sedimentation Rate No results for input(s): "ESRSEDRATE" in the last 72 hours. C-Reactive Protein No results for input(s): "CRP" in the last 72 hours.  Microbiology: reviewed Studies/Results: US  EKG SITE RITE Result Date: 03/11/2024 If Site Rite image not attached, placement could not be confirmed due to current cardiac rhythm.    Assessment/Plan: Sacral decub ulcer =  We will plan to change IV abtx to oral abtx course to complete course of  treatment. Plan to start cefadroxil , flagyl  tomorrow. No hx of mrsa  Plan to treat for 7 addn days through 4/30. Mainstay will be to continue wound care and nutritional support  Spent 50 min with daughter explaining treatment plan from infectious disease standpoint.  Recommend increasing tylenol  dose to see if can have better pain management so that he can participate with physical therapy. Daughter does hope he can be re-evaluated for acute rehab if better pain control.  I have personally spent 50 minutes involved in face-to-face and non-face-to-face activities for this patient on the day of the visit. Professional time spent includes the following activities: Preparing to see the patient (review of tests), Obtaining and/or reviewing separately obtained history (admission/discharge record), Performing a medically appropriate examination and/or evaluation , Ordering medications/tests/procedures, referring and communicating with other health care professionals, Documenting clinical information in the EMR, Independently interpreting results (not separately reported), Communicating results to the patient/family/caregiver, Counseling and educating the patient/family/caregiver and Care coordination (not separately reported).     [facility] Pager: [phone]  03/12/2024, 4:26 PM

## 2024-03-12 NOTE — Progress Notes (Addendum)
 Daily Progress Note   Patient Name: Earl [name]       Date: 03/12/2024 DOB: 1941-11-16  Age: 83 y.o. MRN#: [id number] Attending Physician: Leona Rake, MD Primary Care Physician: [name], MD Admit Date: 03/04/2024  Reason for Consultation/Follow-up: Establishing goals of care  Subjective: Medical records reviewed including progress notes, labs, imaging. Patient assessed at the bedside.  He is grimacing slightly with ongoing efforts to manage pain.  Discussed with RN.  His daughter is present visiting.  Created space and opportunity for patient and family's thoughts and feelings on his current illness. Para shares that her top concerns at this time are to pursue CIR placement, determine when he may moved to another floor, and advocate for better documentation of his wound care/sacral ulcer progression.  She would like to see more details charted during his evaluations, such as the depth and distance across wound bed.  She notes that this was initially done but then no longer commented on.  She is grateful that he does not need surgery, but feels it would be helpful if she could be more informed about how the wound size changes over time and other appropriate treatment options.  She does not want to do amoxicillin challenge in light of his several other serious complications and concerns with his overall health status.    Para also wants to ensure that he gets air mattress when transferred to stepdown unit, as this took 2 days to acquire when he was moved up from the ED.  Pain management strategies were reviewed in detail.  Daughter's concern is that overmedication may interfere with his participation in physical therapy or his appetite/alertness at mealtimes.  I presented the option to have a range of doses available for  different instances of breakthrough pain, with higher doses if he has already participated in therapy and eaten but is still suffering from 10 out of 10 pain.  She is hesitant at this time, preferring to continue with twice daily oxycodone  as needed and 0.25 mg IV Dilaudid  as needed.  I then received call from patient's daughter with concern regarding discharge to rehab prior to pain being managed. Attempted to return her call but was unable to reach.  Addendum: returned to the bedside to continue discussions with patient's daughter.. Provided recommendations for long-acting opioids for baseline pain relief and continuation of short acting oxycodone  and IV Dilaudid  for breakthrough and incidental pain.  The risks and benefits OxyContin  were reviewed and discussed.  Patient's daughter would like to discuss with him more later around dinnertime when he is more alert and let the team know when  they are ready to proceed with first dose.  Reviewed dosing of 10 PM and 10 AM with ongoing titration based on his response.  Questions and concerns addressed. PMT will continue to support holistically.   Length of Stay: 8   Physical Exam Vitals and nursing note reviewed.  Constitutional:      General: He is not in acute distress.    Appearance: He is ill-appearing.  Cardiovascular:     Rate and Rhythm: Normal rate.  Pulmonary:     Effort: Pulmonary effort is normal.  Psychiatric:        Behavior: Behavior normal. Behavior is cooperative.            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    Palliative care evaluated Earl [name] earlier this month and have been asked to get re-involved for additional goals of care conversations.   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 Recent COVID infection Severe protein calorie malnutrition  Recommendations/Plan: Continue DNR/DNI Continue current care plan Patient's daughter prefers to minimize opioids and declines titration of pain medications today Goal is for CIR placement if possible, appreciate TOC assistance with exploring this option Please thoroughly document dimensions, depth, and other pertinent wound parameters when evaluating sacral ulcer - daughter would like to be informed Ensure patient has air mattress when stable to move from progressive floor Ongoing GOC discussions pending clinical course PMT will continue to follow and support  Addendum: Recommended OxyContin  p.o. 10 mg twice daily.  Patient daughter would like to discuss with him further and that the team know later this evening if she would like this order to be placed.  Relayed to RN and MD.  Received update that patient and daughter agreeable, OxyContin  ordered as above.  Prognosis: Concerning prognosis in light of recurrent hospitalizations, declining functional/nutritional status, and several chronic comorbidities  Discharge Planning: To Be Determined  Care plan was discussed with patient, patient's daughter, MD, TOC, RN   Total time: I spent 50 minutes in the care of the patient today in the above activities and documenting the encounter. *Additional  time: 15 minutes        Shekia Kuper P Earl Schnitzer, PA-C  Palliative Medicine Team Team phone # [phone]  Thank you for allowing the Palliative Medicine Team to assist in the care of this patient. Please utilize secure chat with additional questions, if there is no response within 30 minutes please call the above phone number.  Palliative Medicine Team providers are available by phone from 7am to 7pm daily and can be reached through the team cell phone.  Should this patient require assistance outside of these hours, please call  the patient's attending physician.

## 2024-03-12 NOTE — Progress Notes (Signed)
 Physical Therapy Wound Treatment Patient Details  Name: Earl [name] MRN: [id number] Date of Birth: [birth year]  Today's Date: 03/12/2024 Time: 7829-5621 Time Calculation (min): 32 min  Subjective  Subjective Assessment Subjective: Pt and daughter pleasant and agreeable to wound therapy Patient and Family Stated Goals: Heal wound Date of Onset:  (Unknown) Prior Treatments: Dressing changes, off weighting  Pain Score:   Pt premedicated with IV Dilaudid  and moaning with pain at times.   Wound Assessment  Pressure Injury 02/26/24 Coccyx Mid Unstageable - Full thickness tissue loss in which the base of the injury is covered by slough (yellow, tan, gray, green or brown) and/or eschar (tan, brown or black) in the wound bed. (Active)  Wound Image   03/12/24 1452  Dressing Type Foam - Lift dressing to assess site every shift;Gauze (Comment);Santyl ;Barrier Film (skin prep);Moist to moist 03/12/24 1452  Dressing Clean, Dry, Intact 03/12/24 1452  Dressing Change Frequency Daily 03/12/24 1452  State of Healing Early/partial granulation 03/12/24 1452  Site / Wound Assessment Pink;Red;Brown;Yellow 03/12/24 1452  % Wound base Red or Granulating 45% 03/12/24 1452  % Wound base Yellow/Fibrinous Exudate 45% 03/12/24 1452  % Wound base Black/Eschar 10% 03/12/24 1452  % Wound base Other/Granulation Tissue (Comment) 0% 03/12/24 1452  Peri-wound Assessment Pink 03/12/24 1452  Wound Length (cm) 9 cm 03/12/24 1452  Wound Width (cm) 5.8 cm 03/12/24 1452  Wound Depth (cm) 2.8 cm 03/12/24 1452  Wound Surface Area (cm^2) 52.2 cm^2 03/12/24 1452  Wound Volume (cm^3) 146.16 cm^3 03/12/24 1452  Tunneling (cm) Up to 6.2 cm from 12:00-2:00 03/12/24 1452  Undermining (cm) 0 03/12/24 1452  Margins Unattached edges (unapproximated) 03/12/24 1452  Drainage Amount Moderate 03/12/24 1452  Drainage Description Purulent 03/12/24 1452  Treatment Debridement (Selective);Irrigation;Other (Comment) 03/12/24 1452       Selective Debridement (non-excisional) Selective Debridement (non-excisional) - Location: Sacrum Selective Debridement (non-excisional) - Tools Used: Forceps, Scalpel, Scissors Selective Debridement (non-excisional) - Tissue Removed: Brown and yellow unviable tissue    Wound Assessment and Plan  Wound Therapy - Assess/Plan/Recommendations Wound Therapy - Clinical Statement: Wound bed appears improved. There continues to be purulent drainage when packing is removed however less today than in previous session. Unable to visualize tunnel and feel there is likely necrotic tissue into the tunnel. This patient will benefit from continued selective removal of unviable tissue, to decrease bioburden, and promote wound bed healing. Wound Therapy - Functional Problem List: Decreased tolerance for position changes or OOB. Factors Delaying/Impairing Wound Healing: Diabetes Mellitus, Immobility, Infection - systemic/local, Multiple medical problems Hydrotherapy Plan: Debridement, Dressing change, Patient/family education Wound Therapy - Frequency: 2X / week Wound Therapy - Follow Up Recommendations: dressing changes by RN  Wound Therapy Goals- Improve the function of patient's integumentary system by progressing the wound(s) through the phases of wound healing (inflammation - proliferation - remodeling) by: Wound Therapy Goals - Improve the function of patient's integumentary system by progressing the wound(s) through the phases of wound healing by: Decrease Necrotic Tissue to: 20% Decrease Necrotic Tissue - Progress: Progressing toward goal Increase Granulation Tissue to: 80% Increase Granulation Tissue - Progress: Progressing toward goal Goals/treatment plan/discharge plan were made with and agreed upon by patient/family: Yes Time For Goal Achievement: 7 days Wound Therapy - Potential for Goals: Good  Goals will be updated until maximal potential achieved or discharge criteria met.  Discharge  criteria: when goals achieved, discharge from hospital, MD decision/surgical intervention, no progress towards goals, refusal/missing three consecutive treatments without notification or medical reason.  GP     Charges PT Wound Care Charges $Wound Debridement up to 20 cm: < or equal to 20 cm $ Wound Debridement each add'l 20 sqcm: 1 $PT Hydrotherapy Visit: 1 Visit       [name] 03/12/2024, 2:57 PM  Simone Dubois, PT, DPT Acute Rehabilitation Services Secure Chat Preferred Office: [phone]

## 2024-03-13 ENCOUNTER — Inpatient Hospital Stay: Admitting: Sports Medicine

## 2024-03-13 DIAGNOSIS — R6520 Severe sepsis without septic shock: Secondary | ICD-10-CM | POA: Diagnosis not present

## 2024-03-13 DIAGNOSIS — E854 Organ-limited amyloidosis: Secondary | ICD-10-CM | POA: Diagnosis not present

## 2024-03-13 DIAGNOSIS — A419 Sepsis, unspecified organism: Secondary | ICD-10-CM | POA: Diagnosis not present

## 2024-03-13 DIAGNOSIS — I5022 Chronic systolic (congestive) heart failure: Secondary | ICD-10-CM | POA: Diagnosis not present

## 2024-03-13 LAB — GLUCOSE, CAPILLARY
Glucose-Capillary: 165 mg/dL — ABNORMAL HIGH (ref 70–99)
Glucose-Capillary: 166 mg/dL — ABNORMAL HIGH (ref 70–99)
Glucose-Capillary: 182 mg/dL — ABNORMAL HIGH (ref 70–99)
Glucose-Capillary: 208 mg/dL — ABNORMAL HIGH (ref 70–99)
Glucose-Capillary: 278 mg/dL — ABNORMAL HIGH (ref 70–99)

## 2024-03-13 MED ORDER — SMOG ENEMA
960.0000 mL | Freq: Once | RECTAL | Status: AC
  Administered 2024-03-13: 960 mL via RECTAL
  Filled 2024-03-13: qty 960

## 2024-03-13 MED ORDER — OXYCODONE HCL 5 MG PO TABS
5.0000 mg | ORAL_TABLET | ORAL | Status: DC | PRN
  Administered 2024-03-13 – 2024-03-17 (×3): 5 mg via ORAL
  Filled 2024-03-13 (×3): qty 1

## 2024-03-13 NOTE — Progress Notes (Signed)
 Primary RN attempted to completed patient's dressings changes as ordered. Patient refused dressing changes despite RN offering to premedicate patient with pain medicine. Patient requested dressing change to be completed on the next shift. RN will notify oncoming RN of patient's request. RN attempted to educate patient on risks associated with refusal. Patient understands teaching from RN.

## 2024-03-13 NOTE — Consult Note (Signed)
 WOC Nurse wound follow up Wound type: Multiple pressure injuries 1 - Left Hip PI stage 2 Measurement: 5 x 7 x 0.1 cm Wound bed: 100% red, blister ruptured. Drainage (amount, consistency, odor) Moderated weeping, no odor, serous. Periwound: Dressing procedure/placement/frequency: Apply Xeroform, cover with foam dressing, change daily.  2 - Left shoulder DTPI Measurement: 2.5 x 2 x 0.1 cm Wound bed: 100% red, blister ruptured. Drainage (amount, consistency, odor) Minimum weeping, no odor, serous. Periwound: intact Dressing procedure/placement/frequency: Foam dressing, change every 3 days or PRN.  3 - Sacral Unstageable PI Measurement: 6.5 x 5.5 x 2 cm Wound bed: 60% yellow, 40% beefy red. Drainage (amount, consistency, odor) Minimum amount, no odor, serous Periwound: intacts, white scar tissue surrounding, viable edges. Dressing procedure/placement/frequency: Cleanse with Vashe#[id number]. Apply Xeroform (change daily), cover with sacral foam dressing, change daily or PRN soiling.  4 - Great toe DTPI Measurement: 1 x 1 cm Wound bed: no skin breakdown, black/purple coloration. Drainage (amount, consistency, odor) NONE Periwound: Intact, dry Dressing procedure/placement/frequency: Protect with foam dressing, change every 3 days or PRN.  WOC team will follow weekly.   Please reconsult if further assistance is needed. Thank-you,  Rachel Budds BSN, RN, [employer], WOC  (Pager: [phone])

## 2024-03-13 NOTE — Progress Notes (Signed)
 Inpatient Rehab Admissions Coordinator:   Per [facility] request (family asking) patient was screened for CIR candidacy by Wandalee Gust, MS, CCC-SLP . At this time, Pt. is not at a level to tolerate the intensity of CIR as he is currently total A for bed mobility. Additionally, he does not appear to demonstrate medical necessity for AIR admission and I do not believe payor would approve even if Pt. Were doing more functionally. I will not pursue a rehab consult at this time. Recommend other rehab venues to be pursued. Please contact me with any questions.  Wandalee Gust, MS, CCC-SLP Rehab Admissions Coordinator  [phone] (celll) [phone] (office)

## 2024-03-13 NOTE — Progress Notes (Signed)
 Occupational Therapy Treatment Patient Details Name: Earl [name] MRN: [id number] DOB: Jun 25, 1941 Today's Date: 03/13/2024   History of present illness 83 y.o. male brought to the ED 4/14 from home by EMS with SoB and sacral pain. Found to have acute metabolic encephalopathy, hypotensive, hypoglycemic. Admitted to sepsis secondary to sacral pressure ulcer wound infection, Persistent atrial fibrillation/flutter with RVR  PMH 3/25 COVID admission, DM2, HTN, HLD, CHF, A-fib on Eliquis , stroke with residual right-sided weakness, MGUS, GERD, BPH, hypothyroidism   OT comments  Pt participated in grooming tasks at bed level, unable to sit EOB due to sacral wound pain. Pt's daughter present and supportive. OT provided edema massage to B UEs, AAROM and PROM of B UEs in multiple planes to decrease edema and increase AROM/function. B UEs positioned in elevation at end of session. OT will continue to follow acutely to maximize level of function and safety       If plan is discharge home, recommend the following:  A lot of help with walking and/or transfers;A lot of help with bathing/dressing/bathroom;Assistance with cooking/housework;Assist for transportation;Help with stairs or ramp for entrance;Two people to help with walking and/or transfers   Equipment Recommendations  Other (comment) (defer)    Recommendations for Other Services      Precautions / Restrictions Precautions Precautions: Fall Restrictions Weight Bearing Restrictions Per Provider Order: No       Mobility Bed Mobility     Rolling: Total assist, +2 for physical assistance         General bed mobility comments: pt requiring total A for repositioning in the bed.    Transfers                         Balance                                           ADL either performed or assessed with clinical judgement   ADL Overall ADL's : Needs assistance/impaired     Grooming: Wash/dry  hands;Wash/dry face;Minimal assistance;Sitting;Bed level               Lower Body Dressing: Total assistance;Bed level                 General ADL Comments: pt unable to sit EOB due to sacral wound pain    Extremity/Trunk Assessment Upper Extremity Assessment Upper Extremity Assessment: Generalized weakness;Right hand dominant;RUE deficits/detail;LUE deficits/detail RUE Deficits / Details: R UE affected from CVA ~6-7 years ago per daughter but pt was able to perform ADLs and reach overhead prior to recent COVID LUE Deficits / Details: L UE with edema,soreness. Pt able to FF ~ 30 degrees, AAROM 0-90 degrees. Pt's daughter reports that prior to recent hospitalizatioj for COVID, pt with B E AROM WFLs, performing all ADLs and was reaching overhead   Lower Extremity Assessment Lower Extremity Assessment: Defer to PT evaluation        Vision Baseline Vision/History: 1 Wears glasses Ability to See in Adequate Light: 0 Adequate Patient Visual Report: No change from baseline     Perception     Praxis     Communication Communication Factors Affecting Communication: Difficulty expressing self   Cognition Arousal: Alert Behavior During Therapy: [facility] for tasks assessed/performed Cognition: No apparent impairments  Following commands impaired: Follows one step commands with increased time, Follows multi-step commands with increased time      Cueing   Cueing Techniques: Verbal cues, Tactile cues, Visual cues  Exercises Other Exercises Other Exercises: Edema massage to B UEs, AAROM and PROM of B UEs in multiple planes to decrease edema and increase AROM/function. B UEs positioned in elevation at end of session    Shoulder Instructions       General Comments      Pertinent Vitals/ Pain       Pain Assessment Pain Assessment: Faces Faces Pain Scale: Hurts a little bit Pain Location: P/AAROM L elbow and digits Pain  Descriptors / Indicators: Grimacing, Discomfort, Tightness Pain Intervention(s): Limited activity within patient's tolerance, Monitored during session, Premedicated before session, Repositioned  Home Living                                          Prior Functioning/Environment              Frequency  Min 2X/week        Progress Toward Goals  OT Goals(current goals can now be found in the care plan section)  Progress towards OT goals: OT to reassess next treatment     Plan      Co-evaluation                 AM-PAC OT "6 Clicks" Daily Activity     Outcome Measure   Help from another person eating meals?: A Little Help from another person taking care of personal grooming?: A Little Help from another person toileting, which includes using toliet, bedpan, or urinal?: Total Help from another person bathing (including washing, rinsing, drying)?: Total Help from another person to put on and taking off regular upper body clothing?: Total Help from another person to put on and taking off regular lower body clothing?: Total 6 Click Score: 10    End of Session    OT Visit Diagnosis: Other abnormalities of gait and mobility (R26.89);Muscle weakness (generalized) (M62.81);History of falling (Z91.81);Pain Pain - Right/Left: Left Pain - part of body: Arm;Hand   Activity Tolerance Patient tolerated treatment well   Patient Left in bed;with call bell/phone within reach;with family/visitor present   Nurse Communication Mobility status;Other (comment) (UEs positioned on elevation following edema mgt and ROM)        Time: 4098-1191 OT Time Calculation (min): 28 min  Charges: OT General Charges $OT Visit: 1 Visit OT Treatments $Self Care/Home Management : 8-22 mins $Therapeutic Activity: 8-22 mins    [name] 03/13/2024, 1:36 PM

## 2024-03-13 NOTE — Progress Notes (Signed)
 PROGRESS NOTE  Earl [name]  NWG:[id number] DOB: November 23, 1940 DOA: 03/04/2024 PCP: [name], MD   Brief Narrative: Patient is a 83 year old male with history of paroxysmal A-fib, insulin -dependent diabetes, hypertension, hyperlipidemia, CVA with residual right-sided weakness, MGUS, GERD, BPH, hypothyroidism, HFrEF with EF of 40 to 45%, chronic pancytopenia who presented to the emergency department from SNF with complaint of shortness of breath, wound, pain in the sacral area.  He was just discharged from here to [facility] rehab 4 days ago.  Hospital course was remarkable for acute metabolic encephalopathy, hypotension, hypoglycemia.  On presentation, he was hypotensive, EKG showed A-fib with RVR, tachypnea but saturating fine on room air.  Respiratory panel negative for COVID.  PCCM consulted for persistent hypotension.  Cardiology also consulted.  General surgery consulted for sacral ulcer.  Started on broad-spectrum antibiotics, amiodarone  drip.  Status post bedside excisional debridement of the sacral wound on 4/15 and 4/17.  Wound care, cardiology, general surgery following.  No plan for further operative intervention for sacral ulcer.  ID consulted for assistance with antibiotics.  Plan for SNF, should be ready for discharge in next 1 to 2 days  Assessment & Plan:  Principal Problem:   Severe sepsis (HCC) Active Problems:   Paroxysmal atrial fibrillation (HCC)   Sepsis (HCC)   Atrial flutter (HCC)   Sacral ulcer (HCC)   MGUS (monoclonal gammopathy of unknown significance)   Essential hypertension   Hyperlipidemia   Insulin  dependent type 2 diabetes mellitus (HCC)   History of CVA (cerebrovascular accident)   Cardiac amyloidosis (HCC)   Chronic systolic CHF (congestive heart failure) (HCC)   GERD (gastroesophageal reflux disease)   History of hypertension   Atrial fibrillation with RVR (HCC)  Sepsis secondary to sacral pressure wound/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  Hypotensive, encephalopathic, hypoglycemic, elevated lactate, leukocytosis on presentation.  Started on IV fluid, broad spectrum antibiotics.  Follow-up cultures:NGTD Code sepsis was activated.  PCCM recommended to continue midodrine . Blood pressure has been better now with systolic in the range of 100s.  Currently he is alert and mostly oriented.  Chronic sacral wound with suspicion of infection: CT chest/abdomen/pelvis showed sacral decubitus wound with extension to the right gluteus muscle, no definitive bony lesions,no signs of osteomyelitis.  General surgery consulted,   Continue current antibiotics.  PT consulted for hydrotherapy.  Continue zinc , vitamin C .Status post bedside excisional debridement of the sacral wound on 4/15 and 4/17.  Observed to have purulent drainage.  Surgery recommended to continue twice daily wet-to-dry dressing changes with Dakin's.  General surgery not planning for further surgical debridement.  Wound care nurse following for other pressure ulcers.  Needs to continue wound care on discharge.  ID recommended cefadroxil , Flagyl  for a week  History of paroxysmal A-fib: Presented with A-fib with RVR.  Toprol  was discontinued on last admission.  Started on amiodarone  drip.  Cardiology was following.   Digoxin  discontinued.  Amiodarone  changed to oral.  Eliquis   restarted.  Currently rate is well-controlled  History of HFrEF/transthyretin cardiac amyloidosis/nonischemic cardiomyopathy/hypotension: Presented with hypotension.  Takes Entresto , Toprol  at home,. Currently on hold.  Given IV fluids initially.    Cardiology team was following,continue midodrine   Insulin -dependent diabetes type 2: Presented with hyperglycemia.  Recent A1c in the range of 8.  Diabetic coordinator consulted.  Currently on sliding scale, Lantus   History of CVA: Has a right-sided residual  weakness.  On Eliquis , Lipitor.  GERD: Continue Protonix  Anemia of chronic disease: Continue iron supplementation  Hypothyroidism: Continue levothyroxine   Recent history of COVID: COVID was positive on 02/14/2024.  Still positive on this admission.  Out of 21 days window  Severe protein calorie malnutrition/suspicion for dysphagia/hypophosphatemia: Speech therapy consulted.  Phosphorus being  supplemented and corrected.  Dietitian was consulted.  Constipation: Continue bowel regimen.  Had a small bowel movement on 4/21. Ordered smog enema  Goals of care: Very deconditioned elderly male with bedbound status, multiple comorbidities, sacral pressure ulcer.  CODE STATUS DNR.  Palliative care consulted for goals of care.  DNR.  PT/OT consulted.  Recommended SNF on discharge.  TOC following   Nutrition Problem: Inadequate oral intake Etiology: decreased appetite Pressure Injury 02/26/24 Coccyx Mid Unstageable - Full thickness tissue loss in which the base of the injury is covered by slough (yellow, tan, gray, green or brown) and/or eschar (tan, brown or black) in the wound bed. (Active)  02/26/24 0916  Location: Coccyx  Location Orientation: Mid  Staging: Unstageable - Full thickness tissue loss in which the base of the injury is covered by slough (yellow, tan, gray, green or brown) and/or eschar (tan, brown or black) in the wound bed.  Wound Description (Comments):   Present on Admission: No  Dressing Type Foam - Lift dressing to assess site every shift 03/12/24 2000     Pressure Injury 02/26/24 Toe (Comment  which one) Anterior;Right Deep Tissue Pressure Injury - Purple or maroon localized area of discolored intact skin or blood-filled blister due to damage of underlying soft tissue from pressure and/or shear. right gre (Active)  02/26/24 1215  Location: Toe (Comment  which one)  Location Orientation: Anterior;Right  Staging: Deep Tissue Pressure Injury - Purple or maroon localized area  of discolored intact skin or blood-filled blister due to damage of underlying soft tissue from pressure and/or shear.  Wound Description (Comments): right great toe  Present on Admission:   Dressing Type Foam - Lift dressing to assess site every shift 03/12/24 2000     Pressure Injury 03/06/24 Hip Anterior;Left;Proximal Deep Tissue Pressure Injury - Purple or maroon localized area of discolored intact skin or blood-filled blister due to damage of underlying soft tissue from pressure and/or shear. Fluid filled blister w (Active)  03/06/24 1612  Location: Hip  Location Orientation: Anterior;Left;Proximal  Staging: Deep Tissue Pressure Injury - Purple or maroon localized area of discolored intact skin or blood-filled blister due to damage of underlying soft tissue from pressure and/or shear.  Wound Description (Comments): Fluid filled blister with purple discoloration  Present on Admission: No  Dressing Type Foam - Lift dressing to assess site every shift 03/12/24 2000     Pressure Injury 03/06/24 Shoulder Left Deep Tissue Pressure Injury - Purple or maroon localized area of discolored intact skin or blood-filled blister due to damage of underlying soft tissue from pressure and/or shear. dark purple discoloration to should (Active)  03/06/24 1613  Location: Shoulder  Location Orientation: Left  Staging: Deep Tissue Pressure Injury - Purple or maroon localized area of discolored intact skin or blood-filled blister due to damage of underlying soft tissue from pressure and/or shear.  Wound Description (Comments): dark purple discoloration to shoulder  Present on Admission: No  Dressing Type Foam - Lift dressing to assess site every shift 03/12/24 2000    DVT prophylaxis:SCDs Start: 03/04/24 2151 Place TED hose Start: 03/04/24 2151 apixaban  (ELIQUIS ) tablet 5 mg     Code Status: Limited: Do not attempt resuscitation (  DNR) -DNR-LIMITED -Do Not Intubate/DNI   Family Communication: Daughter at  bedside on 4/23  Patient status:Inpatient  Patient is from :home  Anticipated discharge to:SNF   Estimated DC date:1-2 days   Consultants:Cardiology,pccm ,surgery  Procedures:None yet  Antimicrobials:  Anti-infectives (From admission, onward)    Start     Dose/Rate Route Frequency Ordered Stop   03/13/24 1000  cefadroxil  (DURICEF) capsule 1,000 mg        1,000 mg Oral 2 times daily 03/12/24 1632 03/20/24 0959   03/07/24 2200  metroNIDAZOLE  (FLAGYL ) tablet 500 mg        500 mg Oral Every 12 hours 03/07/24 0840 03/11/24 2258   03/05/24 1930  vancomycin  (VANCOREADY) IVPB 1250 mg/250 mL  Status:  Discontinued        1,250 mg 166.7 mL/hr over 90 Minutes Intravenous Every 24 hours 03/04/24 2205 03/04/24 2224   03/05/24 1930  Vancomycin  (VANCOCIN ) 1,250 mg in sodium chloride  0.9 % 250 mL IVPB  Status:  Discontinued        1,250 mg 166.7 mL/hr over 90 Minutes Intravenous Every 24 hours 03/04/24 2224 03/05/24 0717   03/05/24 1930  vancomycin  (VANCOREADY) IVPB 1500 mg/300 mL  Status:  Discontinued        1,500 mg 150 mL/hr over 120 Minutes Intravenous Every 24 hours 03/05/24 0717 03/12/24 1636   03/05/24 1400  ceFEPIme  (MAXIPIME ) 2 g in sodium chloride  0.9 % 100 mL IVPB  Status:  Discontinued        2 g 200 mL/hr over 30 Minutes Intravenous Every 8 hours 03/05/24 0717 03/12/24 1632   03/05/24 0600  ceFEPIme  (MAXIPIME ) 2 g in sodium chloride  0.9 % 100 mL IVPB  Status:  Discontinued        2 g 200 mL/hr over 30 Minutes Intravenous Every 12 hours 03/04/24 2205 03/05/24 0717   03/05/24 0000  metroNIDAZOLE  (FLAGYL ) IVPB 500 mg  Status:  Discontinued        500 mg 100 mL/hr over 60 Minutes Intravenous Every 12 hours 03/04/24 2206 03/04/24 2209   03/04/24 1730  aztreonam (AZACTAM) 2 g in sodium chloride  0.9 % 100 mL IVPB  Status:  Discontinued        2 g 200 mL/hr over 30 Minutes Intravenous  Once 03/04/24 1715 03/04/24 1724   03/04/24 1730  metroNIDAZOLE  (FLAGYL ) IVPB 500 mg  Status:   Discontinued        500 mg 100 mL/hr over 60 Minutes Intravenous Every 12 hours 03/04/24 1715 03/07/24 0840   03/04/24 1730  vancomycin  (VANCOREADY) IVPB 1500 mg/300 mL        1,500 mg 150 mL/hr over 120 Minutes Intravenous  Once 03/04/24 1715 03/04/24 2153   03/04/24 1730  ceFEPIme  (MAXIPIME ) 2 g in sodium chloride  0.9 % 100 mL IVPB        2 g 200 mL/hr over 30 Minutes Intravenous  Once 03/04/24 1724 03/04/24 1826       Subjective: Patient seen and examined at bedside today.  Overall looks comfortable.  EKG monitor showed A-fib rhythm today.  Blood pressure stable.  He does not look in any kind of distress.  His last bowel l movement was 2 days ago.  Abdomen soft, nondistended.daughter at bedside  Objective: Vitals:   03/12/24 1800 03/12/24 2014 03/13/24 0556 03/13/24 1113  BP: (!) 111/56 136/61 (!) 113/58 (!) 116/58  Pulse: 85 88 83 91  Resp: (!) 22 (!) 23 (!) 23 (!) 22  Temp:  98.9 F (  37.2 C) 98.5 F (36.9 C) 98.2 F (36.8 C)  TempSrc:  Oral Oral Oral  SpO2: 96% 98% 99% 92%  Weight:      Height:        Intake/Output Summary (Last 24 hours) at 03/13/2024 1258 Last data filed at 03/13/2024 0600 Gross per 24 hour  Intake 343 ml  Output 1350 ml  Net -1007 ml   Filed Weights   03/04/24 1656  Weight: 68.5 kg    Examination:   General exam: Overall comfortable, not in distress, deconditioned, chronically ill looking HEENT: PERRL Respiratory system:  no wheezes or crackles  Cardiovascular system: S1 & S2 heard, RRR.  Gastrointestinal system: Abdomen is nondistended, soft and nontender. Central nervous system: Alert and oriented Extremities: No edema, no clubbing ,no cyanosis Skin: Sacral pressure ulcer, scattered pressure ulcers   Data Reviewed: I have personally reviewed following labs and imaging studies  CBC: Recent Labs  Lab 03/08/24 0445 03/09/24 0832 03/10/24 0555 03/11/24 0657 03/12/24 0652  WBC 12.5* 10.2 12.2* 13.8* 13.0*  HGB 9.5* 9.3* 8.7* 8.6*  8.4*  HCT 27.4* 26.4* 25.0* 24.8* 24.7*  MCV 83.8 83.3 83.6 83.8 84.9  PLT 302 272 265 319 342   Basic Metabolic Panel: Recent Labs  Lab 03/08/24 0445 03/09/24 0832 03/10/24 0555 03/11/24 0657 03/12/24 0652  NA 134*  --  134*  --  135  K 3.7  --  4.0  --  4.1  CL 101  --  105  --  108  CO2 21*  --  22  --  23  GLUCOSE 160*  --  149*  --  157*  BUN 16  --  16  --  17  CREATININE 0.86  --  0.80  --  0.79  CALCIUM  7.4*  --  7.4*  --  7.5*  PHOS 2.1* 2.3* 2.0* 3.0 2.9     Recent Results (from the past 240 hours)  Resp panel by RT-PCR (RSV, Flu A&B, Covid) Anterior Nasal Swab     Status: Abnormal   Collection Time: 03/04/24  5:38 PM   Specimen: Anterior Nasal Swab  Result Value Ref Range Status   SARS Coronavirus 2 by RT PCR POSITIVE (A) NEGATIVE Final   Influenza A by PCR NEGATIVE NEGATIVE Final   Influenza B by PCR NEGATIVE NEGATIVE Final    Comment: (NOTE) The Xpert Xpress SARS-CoV-2/FLU/RSV plus assay is intended as an aid in the diagnosis of influenza from Nasopharyngeal swab specimens and should not be used as a sole basis for treatment. Nasal washings and aspirates are unacceptable for Xpert Xpress SARS-CoV-2/FLU/RSV testing.  Fact Sheet for Patients: BloggerCourse.com  Fact Sheet for Healthcare Providers: SeriousBroker.it  This test is not yet approved or cleared by the [place]  FDA and has been authorized for detection and/or diagnosis of SARS-CoV-2 by FDA under an Emergency Use Authorization (EUA). This EUA will remain in effect (meaning this test can be used) for the duration of the COVID-19 declaration under Section 564(b)(1) of the Act, 21 U.S.C. section 360bbb-3(b)(1), unless the authorization is terminated or revoked.     Resp Syncytial Virus by PCR NEGATIVE NEGATIVE Final    Comment: (NOTE) Fact Sheet for Patients: BloggerCourse.com  Fact Sheet for Healthcare  Providers: SeriousBroker.it  This test is not yet approved or cleared by the [place]  FDA and has been authorized for detection and/or diagnosis of SARS-CoV-2 by FDA under an Emergency Use Authorization (EUA). This EUA will remain in effect (meaning this test can  be used) for the duration of the COVID-19 declaration under Section 564(b)(1) of the Act, 21 U.S.C. section 360bbb-3(b)(1), unless the authorization is terminated or revoked.  Performed at [facility], [address], [place], [place] [address]   Culture, blood (x 2)     Status: None   Collection Time: 03/04/24  5:44 PM   Specimen: BLOOD LEFT HAND  Result Value Ref Range Status   Specimen Description BLOOD LEFT HAND  Final   Special Requests   Final    BOTTLES DRAWN AEROBIC ONLY Blood Culture adequate volume   Culture   Final    NO GROWTH 5 DAYS Performed at [facility], [address], [place], [place] [address]    Report Status 03/09/2024 FINAL  Final  Culture, blood (x 2)     Status: None   Collection Time: 03/04/24  6:14 PM   Specimen: BLOOD RIGHT HAND  Result Value Ref Range Status   Specimen Description BLOOD RIGHT HAND  Final   Special Requests   Final    BOTTLES DRAWN AEROBIC ONLY Blood Culture results may not be optimal due to an inadequate volume of blood received in culture bottles   Culture   Final    NO GROWTH 5 DAYS Performed at [facility], [address], [place], [place] [address]    Report Status 03/09/2024 FINAL  Final     Radiology Studies: No results found.    Scheduled Meds:  acetaminophen   500 mg Oral TID   amiodarone   200 mg Oral BID   Followed by   Cecily Cohen ON 03/17/2024] amiodarone   200 mg Oral Daily   apixaban   5 mg Oral BID   ascorbic acid   500 mg Oral BID   atorvastatin   40 mg Oral Daily   cefadroxil   1,000 mg Oral BID   Chlorhexidine  Gluconate Cloth  6 each Topical Daily   collagenase    Topical Daily   feeding supplement  (GLUCERNA SHAKE)  237 mL Oral TID BM   ferrous sulfate   325 mg Oral QHS   insulin  aspart  0-15 Units Subcutaneous TID WC   insulin  aspart  0-5 Units Subcutaneous QHS   insulin  aspart  3 Units Subcutaneous TID WC   insulin  glargine-yfgn  15 Units Subcutaneous Daily   leptospermum manuka honey  1 Application Topical Daily   levothyroxine   50 mcg Oral Q0600   liver oil-zinc  oxide   Topical BID   midodrine   5 mg Oral TID WC   oxyCODONE   10 mg Oral Q12H   phosphorus  500 mg Oral TID   polyethylene glycol  17 g Oral BID   senna-docusate  1 tablet Oral BID   sodium chloride  flush  3 mL Intravenous Q12H   Tafamidis   1 capsule Oral Daily   zinc  sulfate (50mg  elemental zinc )  220 mg Oral Daily   Continuous Infusions:     LOS: 9 days   Leona Rake, MD [facility] P4/23/2025, 12:58 PM

## 2024-03-13 NOTE — Plan of Care (Signed)
  Problem: Fluid Volume: Goal: Hemodynamic stability will improve Outcome: Progressing   Problem: Clinical Measurements: Goal: Diagnostic test results will improve Outcome: Progressing Goal: Signs and symptoms of infection will decrease Outcome: Progressing   Problem: Respiratory: Goal: Ability to maintain adequate ventilation will improve Outcome: Progressing   Problem: Education: Goal: Knowledge of risk factors and measures for prevention of condition will improve Outcome: Progressing   Problem: Coping: Goal: Psychosocial and spiritual needs will be supported Outcome: Progressing   Problem: Respiratory: Goal: Will maintain a patent airway Outcome: Progressing Goal: Complications related to the disease process, condition or treatment will be avoided or minimized Outcome: Progressing   Problem: Education: Goal: Ability to describe self-care measures that may prevent or decrease complications (Diabetes Survival Skills Education) will improve Outcome: Progressing Goal: Individualized Educational Video(s) Outcome: Progressing   Problem: Coping: Goal: Ability to adjust to condition or change in health will improve Outcome: Progressing   Problem: Fluid Volume: Goal: Ability to maintain a balanced intake and output will improve Outcome: Progressing   Problem: Health Behavior/Discharge Planning: Goal: Ability to identify and utilize available resources and services will improve Outcome: Progressing Goal: Ability to manage health-related needs will improve Outcome: Progressing   Problem: Metabolic: Goal: Ability to maintain appropriate glucose levels will improve Outcome: Progressing   Problem: Nutritional: Goal: Maintenance of adequate nutrition will improve Outcome: Progressing Goal: Progress toward achieving an optimal weight will improve Outcome: Progressing   Problem: Skin Integrity: Goal: Risk for impaired skin integrity will decrease Outcome: Progressing    Problem: Tissue Perfusion: Goal: Adequacy of tissue perfusion will improve Outcome: Progressing   Problem: Education: Goal: Knowledge of General Education information will improve Description: Including pain rating scale, medication(s)/side effects and non-pharmacologic comfort measures Outcome: Progressing   Problem: Health Behavior/Discharge Planning: Goal: Ability to manage health-related needs will improve Outcome: Progressing   Problem: Clinical Measurements: Goal: Ability to maintain clinical measurements within normal limits will improve Outcome: Progressing Goal: Will remain free from infection Outcome: Progressing Goal: Diagnostic test results will improve Outcome: Progressing Goal: Respiratory complications will improve Outcome: Progressing Goal: Cardiovascular complication will be avoided Outcome: Progressing   Problem: Activity: Goal: Risk for activity intolerance will decrease Outcome: Progressing   Problem: Nutrition: Goal: Adequate nutrition will be maintained Outcome: Progressing   Problem: Coping: Goal: Level of anxiety will decrease Outcome: Progressing   Problem: Elimination: Goal: Will not experience complications related to bowel motility Outcome: Progressing Goal: Will not experience complications related to urinary retention Outcome: Progressing   Problem: Pain Managment: Goal: General experience of comfort will improve and/or be controlled Outcome: Progressing   Problem: Safety: Goal: Ability to remain free from injury will improve Outcome: Progressing   Problem: Skin Integrity: Goal: Risk for impaired skin integrity will decrease Outcome: Progressing

## 2024-03-13 NOTE — Consult Note (Incomplete)
                                                                                   Consultation Note Date: 03/13/2024   Patient Name: Earl [name]  DOB: [birth year]  MRN: [id number]  Age / Sex: 83 y.o., male  PCP: [name], MD Referring Physician: Leona Rake, MD  Reason for Consultation:   HPI/Patient Profile: 83 y.o. male  with past medical history of *** admitted on 03/04/2024 with ***.   Primary Decision Maker {Primary Decision UYQIH:[id number]}  Discussion: ***    SUMMARY OF RECOMMENDATIONS   *** Code Status/Advance Care Planning:   Code Status: Limited: Do not attempt resuscitation (DNR) -DNR-LIMITED -Do Not Intubate/DNI     Prognosis:   {Palliative Care Prognosis:23504}  Discharge Planning: {Palliative dispostion:23505}  Primary Diagnoses: Present on Admission:  Paroxysmal atrial fibrillation (HCC)  Essential hypertension  Chronic systolic CHF (congestive heart failure) (HCC)  MGUS (monoclonal gammopathy of unknown significance)  Hyperlipidemia  Cardiac amyloidosis (HCC)  Severe sepsis (HCC)   Review of Systems  Physical Exam  Vital Signs: BP (!) 116/58 (BP Location: Right Arm)   Pulse 91   Temp 98.2 F (36.8 C) (Oral)   Resp (!) 22   Ht 5\' 11"  (1.803 m)   Wt 68.5 kg   SpO2 92%   BMI 21.06 kg/m  Pain Scale: 0-10 POSS *See Group Information*: S-Acceptable,Sleep, easy to arouse Pain Score: 7    SpO2: SpO2: 92 % O2 Device:SpO2: 92 % O2 Flow Rate: .   IO: Intake/output summary:  Intake/Output Summary (Last 24 hours) at 03/13/2024 1433 Last data filed at 03/13/2024 0600 Gross per 24 hour  Intake 343 ml  Output 1350 ml  Net -1007 ml    LBM: Last BM Date : 03/05/24 Baseline Weight: Weight: 68.5 kg Most recent weight: Weight: 68.5 kg       Thank you for this consult. Palliative medicine will continue to follow and assist as needed.  Time Total: *** Signed by: Micki Alas, AGNP-C Palliative Medicine  Time includes:    Preparing to see the patient (e.g., review of tests) Obtaining and/or reviewing separately obtained history Performing a medically necessary appropriate examination and/or evaluation Counseling and educating the patient/family/caregiver Ordering medications, tests, or procedures Referring and communicating with other health care professionals (when not reported separately) Documenting clinical information in the electronic or other health record Independently interpreting results (not reported separately) and communicating results to the patient/family/caregiver Care coordination (not reported separately) Clinical documentation   Please contact Palliative Medicine Team phone at [phone] for questions and concerns.  For individual provider: See Tilford Foley

## 2024-03-13 NOTE — Progress Notes (Signed)
 SLP Cancellation Note  Patient Details Name: Earl [name] MRN: [id number] DOB: May 08, 1941   Cancelled treatment:       Reason Eval/Treat Not Completed: Other (comment) Daughter providing assistance to patient to have a BM as he had just been given a suppository. SLP will continue to follow.  Jacqualine Mater, MA, CCC-SLP Speech Therapy

## 2024-03-14 ENCOUNTER — Ambulatory Visit: Payer: Medicare Other | Admitting: Internal Medicine

## 2024-03-14 DIAGNOSIS — A419 Sepsis, unspecified organism: Secondary | ICD-10-CM | POA: Diagnosis not present

## 2024-03-14 DIAGNOSIS — R6520 Severe sepsis without septic shock: Secondary | ICD-10-CM | POA: Diagnosis not present

## 2024-03-14 LAB — CBC
HCT: 26 % — ABNORMAL LOW (ref 39.0–52.0)
Hemoglobin: 8.8 g/dL — ABNORMAL LOW (ref 13.0–17.0)
MCH: 28.8 pg (ref 26.0–34.0)
MCHC: 33.8 g/dL (ref 30.0–36.0)
MCV: 85 fL (ref 80.0–100.0)
Platelets: 410 10*3/uL — ABNORMAL HIGH (ref 150–400)
RBC: 3.06 MIL/uL — ABNORMAL LOW (ref 4.22–5.81)
RDW: 15.2 % (ref 11.5–15.5)
WBC: 11.9 10*3/uL — ABNORMAL HIGH (ref 4.0–10.5)
nRBC: 0 % (ref 0.0–0.2)

## 2024-03-14 LAB — GLUCOSE, CAPILLARY
Glucose-Capillary: 154 mg/dL — ABNORMAL HIGH (ref 70–99)
Glucose-Capillary: 160 mg/dL — ABNORMAL HIGH (ref 70–99)
Glucose-Capillary: 293 mg/dL — ABNORMAL HIGH (ref 70–99)
Glucose-Capillary: 178 mg/dL — ABNORMAL HIGH (ref 70–99)
Glucose-Capillary: 158 mg/dL — ABNORMAL HIGH (ref 70–99)
Glucose-Capillary: 139 mg/dL — ABNORMAL HIGH (ref 70–99)

## 2024-03-14 LAB — PHOSPHORUS: Phosphorus: 2.9 mg/dL (ref 2.5–4.6)

## 2024-03-14 MED ORDER — JUVEN PO PACK
1.0000 | PACK | Freq: Two times a day (BID) | ORAL | Status: DC
  Administered 2024-03-14 – 2024-06-21 (×190): 1 via ORAL
  Filled 2024-03-14 (×185): qty 1

## 2024-03-14 MED ORDER — METRONIDAZOLE 500 MG PO TABS
500.0000 mg | ORAL_TABLET | Freq: Two times a day (BID) | ORAL | Status: AC
  Administered 2024-03-14 – 2024-03-19 (×12): 500 mg via ORAL
  Filled 2024-03-14 (×12): qty 1

## 2024-03-14 MED ORDER — ENSURE ENLIVE PO LIQD
237.0000 mL | Freq: Three times a day (TID) | ORAL | Status: DC
Start: 2024-03-14 — End: 2024-04-23
  Administered 2024-03-14 – 2024-04-23 (×105): 237 mL via ORAL
  Filled 2024-03-14: qty 237

## 2024-03-14 NOTE — Care Management Important Message (Signed)
 Important Message  Patient Details  Name: Earl [name] MRN: [id number] Date of Birth: July 12, 1941   Important Message Given:  Yes - Medicare IM     Wynonia Hedges 03/14/2024, 11:26 AM

## 2024-03-14 NOTE — Progress Notes (Addendum)
 PROGRESS NOTE  Earl [name]  RUE:[id number] DOB: 11/04/1941 DOA: 03/04/2024 PCP: [name], MD   Brief Narrative: Patient is a 83 year old male with history of paroxysmal A-fib, insulin -dependent diabetes, hypertension, hyperlipidemia, CVA with residual right-sided weakness, MGUS, GERD, BPH, hypothyroidism, HFrEF with EF of 40 to 45%, chronic pancytopenia who presented to the emergency department from SNF with complaint of shortness of breath, wound, pain in the sacral area.  He was just discharged from here to [facility] rehab 4 days ago.  Hospital course was remarkable for acute metabolic encephalopathy, hypotension, hypoglycemia.  On presentation, he was hypotensive, EKG showed A-fib with RVR, tachypnea but saturating fine on room air.  Respiratory panel negative for COVID.  PCCM consulted for persistent hypotension.  Cardiology also consulted.  General surgery consulted for sacral ulcer.  Started on broad-spectrum antibiotics, amiodarone  drip.  Status post bedside excisional debridement of the sacral wound on 4/15 and 4/17.  Wound care, cardiology, general surgery following.  No plan for further operative intervention for sacral ulcer.  ID consulted for assistance with antibiotics.  Plan for SNF eventually.Daughter does not think that he is ready for DC.  Assessment & Plan:  Principal Problem:   Severe sepsis (HCC) Active Problems:   Paroxysmal atrial fibrillation (HCC)   Sepsis (HCC)   Atrial flutter (HCC)   Sacral ulcer (HCC)   MGUS (monoclonal gammopathy of unknown significance)   Essential hypertension   Hyperlipidemia   Insulin  dependent type 2 diabetes mellitus (HCC)   History of CVA (cerebrovascular accident)   Cardiac amyloidosis (HCC)   Chronic systolic CHF (congestive heart failure) (HCC)   GERD (gastroesophageal reflux disease)   History of hypertension   Atrial fibrillation with RVR (HCC)  Sepsis secondary to sacral pressure wound/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  Hypotensive, encephalopathic, hypoglycemic, elevated lactate, leukocytosis on presentation.  Started on IV fluid, broad spectrum antibiotics.  Follow-up cultures:NGTD Code sepsis was activated.  PCCM recommended to continue midodrine . Blood pressure has been better now with systolic in the range of 100s.   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  General surgery consulted,   Continue current antibiotics.  PT consulted for hydrotherapy.  Continue zinc , vitamin C .Status post bedside excisional debridement of the sacral wound on 4/15 and 4/17.  Observed to have purulent drainage.  Surgery recommended to continue twice daily wet-to-dry dressing changes with Dakin's.  General surgery not planning for further surgical debridement.  Wound care nurse following for other pressure ulcers.  Needs to continue wound care on discharge.  ID recommended cefadroxil , Flagyl  for a week  History of paroxysmal A-fib: Presented with A-fib with RVR.  Toprol  was discontinued on last admission.  Started on amiodarone  drip.  Cardiology was following.   Digoxin  discontinued.  Amiodarone  changed to oral.  Eliquis   restarted.  Currently rate is well-controlled  History of HFrEF/transthyretin cardiac amyloidosis/nonischemic cardiomyopathy/hypotension: Presented with hypotension.  Takes Entresto , Toprol  at home,. Currently on hold.  Given IV fluids initially.    Cardiology team was following,continue midodrine   Insulin -dependent diabetes type 2: Presented with hyperglycemia.  Recent A1c in the range of 8.  Diabetic coordinator consulted.  Currently on sliding scale, Lantus   History of CVA:  Has a right-sided residual weakness.  On Eliquis , Lipitor.  GERD: Continue Protonix  Anemia of chronic disease: Continue iron supplementation  Hypothyroidism: Continue levothyroxine   Recent history of COVID: COVID was positive on 02/14/2024.  Still positive on this admission.  Out of 21 days window  Severe protein calorie malnutrition/suspicion for dysphagia/hypophosphatemia: Speech therapy consulted and following,currently on regular diet.  Phosphorus being  supplemented and corrected.  Dietitian was consulted.  Constipation: Continue bowel regimen.  Goals of care: Very deconditioned elderly male with bedbound status, multiple comorbidities, sacral pressure ulcer.  CODE STATUS DNR.  Palliative care consulted for goals of care.  DNR.  PT/OT consulted.  Recommended SNF on discharge.  TOC following   Nutrition Problem: Inadequate oral intake Etiology: decreased appetite Pressure Injury 02/26/24 Coccyx Mid Unstageable - Full thickness tissue loss in which the base of the injury is covered by slough (yellow, tan, gray, green or brown) and/or eschar (tan, brown or black) in the wound bed. (Active)  02/26/24 0916  Location: Coccyx  Location Orientation: Mid  Staging: Unstageable - Full thickness tissue loss in which the base of the injury is covered by slough (yellow, tan, gray, green or brown) and/or eschar (tan, brown or black) in the wound bed.  Wound Description (Comments):   Present on Admission: No  Dressing Type Foam - Lift dressing to assess site every shift 03/13/24 1600     Pressure Injury 02/26/24 Toe (Comment  which one) Anterior;Right Deep Tissue Pressure Injury - Purple or maroon localized area of discolored intact skin or blood-filled blister due to damage of underlying soft tissue from pressure and/or shear. right gre (Active)  02/26/24 1215  Location: Toe (Comment  which one)  Location Orientation: Anterior;Right  Staging: Deep Tissue Pressure Injury - Purple or maroon  localized area of discolored intact skin or blood-filled blister due to damage of underlying soft tissue from pressure and/or shear.  Wound Description (Comments): right great toe  Present on Admission:   Dressing Type Foam - Lift dressing to assess site every shift 03/12/24 2000     Pressure Injury 03/06/24 Hip Anterior;Left;Proximal Deep Tissue Pressure Injury - Purple or maroon localized area of discolored intact skin or blood-filled blister due to damage of underlying soft tissue from pressure and/or shear. Fluid filled blister w (Active)  03/06/24 1612  Location: Hip  Location Orientation: Anterior;Left;Proximal  Staging: Deep Tissue Pressure Injury - Purple or maroon localized area of discolored intact skin or blood-filled blister due to damage of underlying soft tissue from pressure and/or shear.  Wound Description (Comments): Fluid filled blister with purple discoloration  Present on Admission: No  Dressing Type Foam - Lift dressing to assess site every shift 03/12/24 2000     Pressure Injury 03/06/24 Shoulder Left Deep Tissue Pressure Injury - Purple or maroon localized area of discolored intact skin or blood-filled blister due to damage of underlying soft tissue from pressure and/or shear. dark purple discoloration to should (Active)  03/06/24 1613  Location: Shoulder  Location Orientation: Left  Staging: Deep Tissue Pressure Injury - Purple or maroon localized area of discolored intact skin or blood-filled blister due to damage of underlying soft tissue from pressure and/or shear.  Wound Description (Comments): dark purple discoloration to shoulder  Present on Admission: No  Dressing Type Foam - Lift dressing to assess site every shift 03/12/24 2000    DVT prophylaxis:SCDs Start: 03/04/24 2151 Place TED hose Start: 03/04/24 2151 apixaban  (ELIQUIS ) tablet 5 mg     Code Status: Limited: Do not attempt resuscitation (DNR) -DNR-LIMITED -  Do Not Intubate/DNI   Family Communication:  Daughter at bedside on 4/24  Patient status:Inpatient  Patient is from :home  Anticipated discharge to:SNF   Estimated DC date:whenever possible   Consultants:Cardiology,pccm ,surgery  Procedures:None yet  Antimicrobials:  Anti-infectives (From admission, onward)    Start     Dose/Rate Route Frequency Ordered Stop   03/14/24 1000  metroNIDAZOLE  (FLAGYL ) tablet 500 mg        500 mg Oral Every 12 hours 03/14/24 0803 03/20/24 0959   03/13/24 1000  cefadroxil  (DURICEF) capsule 1,000 mg        1,000 mg Oral 2 times daily 03/12/24 1632 03/20/24 0959   03/07/24 2200  metroNIDAZOLE  (FLAGYL ) tablet 500 mg        500 mg Oral Every 12 hours 03/07/24 0840 03/11/24 2258   03/05/24 1930  vancomycin  (VANCOREADY) IVPB 1250 mg/250 mL  Status:  Discontinued        1,250 mg 166.7 mL/hr over 90 Minutes Intravenous Every 24 hours 03/04/24 2205 03/04/24 2224   03/05/24 1930  Vancomycin  (VANCOCIN ) 1,250 mg in sodium chloride  0.9 % 250 mL IVPB  Status:  Discontinued        1,250 mg 166.7 mL/hr over 90 Minutes Intravenous Every 24 hours 03/04/24 2224 03/05/24 0717   03/05/24 1930  vancomycin  (VANCOREADY) IVPB 1500 mg/300 mL  Status:  Discontinued        1,500 mg 150 mL/hr over 120 Minutes Intravenous Every 24 hours 03/05/24 0717 03/12/24 1636   03/05/24 1400  ceFEPIme  (MAXIPIME ) 2 g in sodium chloride  0.9 % 100 mL IVPB  Status:  Discontinued        2 g 200 mL/hr over 30 Minutes Intravenous Every 8 hours 03/05/24 0717 03/12/24 1632   03/05/24 0600  ceFEPIme  (MAXIPIME ) 2 g in sodium chloride  0.9 % 100 mL IVPB  Status:  Discontinued        2 g 200 mL/hr over 30 Minutes Intravenous Every 12 hours 03/04/24 2205 03/05/24 0717   03/05/24 0000  metroNIDAZOLE  (FLAGYL ) IVPB 500 mg  Status:  Discontinued        500 mg 100 mL/hr over 60 Minutes Intravenous Every 12 hours 03/04/24 2206 03/04/24 2209   03/04/24 1730  aztreonam (AZACTAM) 2 g in sodium chloride  0.9 % 100 mL IVPB  Status:  Discontinued        2  g 200 mL/hr over 30 Minutes Intravenous  Once 03/04/24 1715 03/04/24 1724   03/04/24 1730  metroNIDAZOLE  (FLAGYL ) IVPB 500 mg  Status:  Discontinued        500 mg 100 mL/hr over 60 Minutes Intravenous Every 12 hours 03/04/24 1715 03/07/24 0840   03/04/24 1730  vancomycin  (VANCOREADY) IVPB 1500 mg/300 mL        1,500 mg 150 mL/hr over 120 Minutes Intravenous  Once 03/04/24 1715 03/04/24 2153   03/04/24 1730  ceFEPIme  (MAXIPIME ) 2 g in sodium chloride  0.9 % 100 mL IVPB        2 g 200 mL/hr over 30 Minutes Intravenous  Once 03/04/24 1724 03/04/24 1826       Subjective: Patient seen and examined at bedside today.  Hemodynamically stable.,  Lying comfortably in bed.  Not in any kind of distress.  Had a good bowel movement yesterday after giving enema.  Heart rate well-controlled  Objective: Vitals:   03/13/24 1946 03/13/24 2339 03/14/24 0457 03/14/24 0729  BP: (!) 119/57 (!) 140/70 128/71 118/66  Pulse: 81 91 86 88  Resp: 17  16 16 16   Temp: 97.8 F (36.6 C) 98.2 F (36.8 C) 98.3 F (36.8 C) 98.3 F (36.8 C)  TempSrc: Oral Oral Oral   SpO2: 97% 100% 95% 98%  Weight:      Height:       No intake or output data in the 24 hours ending 03/14/24 1023  Filed Weights   03/04/24 1656  Weight: 68.5 kg    Examination:  General exam: Overall comfortable, not in distress, very deconditioned, chronically ill looking HEENT: PERRL Respiratory system:  no wheezes or crackles  Cardiovascular system: S1 & S2 heard, RRR.  Gastrointestinal system: Abdomen is nondistended, soft and nontender. Central nervous system: Alert and oriented Extremities: No edema, no clubbing ,no cyanosis Skin: Sacral pressure ulcer, scattered pressure ulcers GU: Foley   Data Reviewed: I have personally reviewed following labs and imaging studies  CBC: Recent Labs  Lab 03/09/24 0832 03/10/24 0555 03/11/24 0657 03/12/24 0652 03/14/24 0708  WBC 10.2 12.2* 13.8* 13.0* 11.9*  HGB 9.3* 8.7* 8.6* 8.4* 8.8*   HCT 26.4* 25.0* 24.8* 24.7* 26.0*  MCV 83.3 83.6 83.8 84.9 85.0  PLT 272 265 319 342 410*   Basic Metabolic Panel: Recent Labs  Lab 03/08/24 0445 03/09/24 0832 03/10/24 0555 03/11/24 0657 03/12/24 0652 03/14/24 0708  NA 134*  --  134*  --  135  --   K 3.7  --  4.0  --  4.1  --   CL 101  --  105  --  108  --   CO2 21*  --  22  --  23  --   GLUCOSE 160*  --  149*  --  157*  --   BUN 16  --  16  --  17  --   CREATININE 0.86  --  0.80  --  0.79  --   CALCIUM  7.4*  --  7.4*  --  7.5*  --   PHOS 2.1* 2.3* 2.0* 3.0 2.9 2.9     Recent Results (from the past 240 hours)  Resp panel by RT-PCR (RSV, Flu A&B, Covid) Anterior Nasal Swab     Status: Abnormal   Collection Time: 03/04/24  5:38 PM   Specimen: Anterior Nasal Swab  Result Value Ref Range Status   SARS Coronavirus 2 by RT PCR POSITIVE (A) NEGATIVE Final   Influenza A by PCR NEGATIVE NEGATIVE Final   Influenza B by PCR NEGATIVE NEGATIVE Final    Comment: (NOTE) The Xpert Xpress SARS-CoV-2/FLU/RSV plus assay is intended as an aid in the diagnosis of influenza from Nasopharyngeal swab specimens and should not be used as a sole basis for treatment. Nasal washings and aspirates are unacceptable for Xpert Xpress SARS-CoV-2/FLU/RSV testing.  Fact Sheet for Patients: BloggerCourse.com  Fact Sheet for Healthcare Providers: SeriousBroker.it  This test is not yet approved or cleared by the [place]  FDA and has been authorized for detection and/or diagnosis of SARS-CoV-2 by FDA under an Emergency Use Authorization (EUA). This EUA will remain in effect (meaning this test can be used) for the duration of the COVID-19 declaration under Section 564(b)(1) of the Act, 21 U.S.C. section 360bbb-3(b)(1), unless the authorization is terminated or revoked.     Resp Syncytial Virus by PCR NEGATIVE NEGATIVE Final    Comment: (NOTE) Fact Sheet for  Patients: BloggerCourse.com  Fact Sheet for Healthcare Providers: SeriousBroker.it  This test is not yet approved or cleared by the [place]  FDA and has been authorized for detection and/or diagnosis  of SARS-CoV-2 by FDA under an Emergency Use Authorization (EUA). This EUA will remain in effect (meaning this test can be used) for the duration of the COVID-19 declaration under Section 564(b)(1) of the Act, 21 U.S.C. section 360bbb-3(b)(1), unless the authorization is terminated or revoked.  Performed at [facility], [address] [place], [place] [address]   Culture, blood (x 2)     Status: None   Collection Time: 03/04/24  5:44 PM   Specimen: BLOOD LEFT HAND  Result Value Ref Range Status   Specimen Description BLOOD LEFT HAND  Final   Special Requests   Final    BOTTLES DRAWN AEROBIC ONLY Blood Culture adequate volume   Culture   Final    NO GROWTH 5 DAYS Performed at [facility], [address], [place], [place] [address]    Report Status 03/09/2024 FINAL  Final  Culture, blood (x 2)     Status: None   Collection Time: 03/04/24  6:14 PM   Specimen: BLOOD RIGHT HAND  Result Value Ref Range Status   Specimen Description BLOOD RIGHT HAND  Final   Special Requests   Final    BOTTLES DRAWN AEROBIC ONLY Blood Culture results may not be optimal due to an inadequate volume of blood received in culture bottles   Culture   Final    NO GROWTH 5 DAYS Performed at [facility], [address], [place], [place] [address]    Report Status 03/09/2024 FINAL  Final     Radiology Studies: No results found.    Scheduled Meds:  acetaminophen   500 mg Oral TID   amiodarone   200 mg Oral BID   Followed by   Cecily Cohen ON 03/17/2024] amiodarone   200 mg Oral Daily   apixaban   5 mg Oral BID   ascorbic acid   500 mg Oral BID   atorvastatin   40 mg Oral Daily   cefadroxil   1,000 mg Oral BID   Chlorhexidine  Gluconate  Cloth  6 each Topical Daily   collagenase    Topical Daily   feeding supplement  237 mL Oral TID BM   ferrous sulfate   325 mg Oral QHS   insulin  aspart  0-15 Units Subcutaneous TID WC   insulin  aspart  0-5 Units Subcutaneous QHS   insulin  aspart  3 Units Subcutaneous TID WC   insulin  glargine-yfgn  15 Units Subcutaneous Daily   leptospermum manuka honey  1 Application Topical Daily   levothyroxine   50 mcg Oral Q0600   liver oil-zinc  oxide   Topical BID   metroNIDAZOLE   500 mg Oral Q12H   midodrine   5 mg Oral TID WC   nutrition supplement (JUVEN)  1 packet Oral BID BM   oxyCODONE   10 mg Oral Q12H   phosphorus  500 mg Oral TID   polyethylene glycol  17 g Oral BID   senna-docusate  1 tablet Oral BID   sodium chloride  flush  3 mL Intravenous Q12H   Tafamidis   1 capsule Oral Daily   zinc  sulfate (50mg  elemental zinc )  220 mg Oral Daily   Continuous Infusions:     LOS: 10 days   Leona Rake, MD [facility] P4/24/2025, 10:23 AM

## 2024-03-14 NOTE — Consult Note (Signed)
 WOC visit to discuss about wound treatment with the family.  The wounds are improving and healing. Thus, the treatment are changing according to the actual status. The daughter is concerned about the developing of new ulcers, the healing process of the old ones, and disagreement regarding the prescription and team instructions.  I show her the actual prescription, the time change for dressing, and a possible time frame to healing the stage 2 PI and the sacral wound. As the improving progresses, I explain why we change the prescription, how we followed the application techniques, the WOC team following week is and what signs we look to understand whether the treatment is working or needs to be change.  I guided her how to help the team to follow every steps to heal and prevent further injuries during a hospital stay, such as warning if is the happens an eevacuation is taking place, if the patient is wet, or to change her position every 2 hours in case a team member not arrives. I compromised to have a open conversation every time that the family needs.  WOC team will continue to follow weekly.  Please reconsult if further assistance is needed. Thank-you,  Rachel Budds BSN, RN, [employer], WOC  (Pager: [phone])

## 2024-03-14 NOTE — Progress Notes (Signed)
 PT Cancellation Note  Patient Details Name: Earl [name] MRN: [id number] DOB: 1941-08-18   Cancelled Treatment:    Reason Eval/Treat Not Completed: Patient at procedure or test/unavailable (WOC at the bedside, patient unavailable. PT will continue with attempts.)  Ozie Bo, PT, MPT  [name] 03/14/2024, 2:57 PM

## 2024-03-14 NOTE — Progress Notes (Signed)
 Speech Language Pathology Treatment: Dysphagia  Patient Details Name: Earl [name] MRN: [id number] DOB: 12/06/1940 Today's Date: 03/14/2024 Time: 4098-1191 SLP Time Calculation (min) (ACUTE ONLY): 20 min  Assessment / Plan / Recommendation Clinical Impression  Patient seen by SLP for skilled treatment focused on dysphagia goals. Patient was awake, alert and receptive to having some ice water. He took a few small sips through straw, continues to exhibit suspected delayed swallow initiation but no immediate overt s/s aspiration. Patient's daughter arrived into room during session and she inquired about her concern that he may be aspirating when he is observed to be coughing with liquids as this was seen on MBS on 12/28/22. SLP reviewed MBS images with daughter and reviewed recommendations of chin tuck posture and small sips.  A repeat MBS would likely not be tolerated by patient secondary to his pain from wound and daughter did not feel he would want or tolerate a FEES scope. SLP suggested that we assume patient is having approximately the same level of aspiration as he did on MBS on 12/28/22 and to see if following the recommendations of small sips and chin tuck helps reduce frequency overt s/s aspiration. SLP also recommending to focus on bed positioning and to consider reverse Trendelenburg position with PO's. SLP will continue to follow.    HPI HPI: Patient is an 83 y.o. 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 He was discharged from the hospital (admitted for acute metabolic encephalopathy, hypotensive, hypoglycemic) four days ago to [facility] for rehab. He presented back to the hospital on 03/04/24 with SOB and pain in sacral area. He was admitted with sepsis secondary to sacral pressure wound/chronic pressure ulcer of sacral area stage III, chronic sacral wound with suspicion of infection, Severe protein  calorie malnutrition. SLP ordered due to suspicious for dysphagia.      SLP Plan  Continue with current plan of care      Recommendations for follow up therapy are one component of a multi-disciplinary discharge planning process, led by the attending physician.  Recommendations may be updated based on patient status, additional functional criteria and insurance authorization.    Recommendations  Diet recommendations: Regular;Thin liquid Liquids provided via: Cup;Straw Supervision: Trained caregiver to feed patient;Full supervision/cueing for compensatory strategies Compensations: Slow rate;Small sips/bites;Chin tuck Postural Changes and/or Swallow Maneuvers: Seated upright 90 degrees;Upright 30-60 min after meal                  Oral care BID   Frequent or constant Supervision/Assistance Dysphagia, unspecified (R13.10)     Continue with current plan of care     Jacqualine Mater, MA, CCC-SLP Speech Therapy

## 2024-03-14 NOTE — Plan of Care (Signed)
  Problem: Fluid Volume: Goal: Hemodynamic stability will improve Outcome: Progressing   Problem: Clinical Measurements: Goal: Diagnostic test results will improve Outcome: Progressing Goal: Signs and symptoms of infection will decrease Outcome: Progressing   Problem: Respiratory: Goal: Ability to maintain adequate ventilation will improve Outcome: Progressing   Problem: Education: Goal: Knowledge of risk factors and measures for prevention of condition will improve Outcome: Progressing   Problem: Coping: Goal: Psychosocial and spiritual needs will be supported Outcome: Progressing   Problem: Respiratory: Goal: Will maintain a patent airway Outcome: Progressing Goal: Complications related to the disease process, condition or treatment will be avoided or minimized Outcome: Progressing   Problem: Education: Goal: Ability to describe self-care measures that may prevent or decrease complications (Diabetes Survival Skills Education) will improve Outcome: Progressing Goal: Individualized Educational Video(s) Outcome: Progressing   Problem: Coping: Goal: Ability to adjust to condition or change in health will improve Outcome: Progressing   Problem: Fluid Volume: Goal: Ability to maintain a balanced intake and output will improve Outcome: Progressing   Problem: Health Behavior/Discharge Planning: Goal: Ability to identify and utilize available resources and services will improve Outcome: Progressing Goal: Ability to manage health-related needs will improve Outcome: Progressing   Problem: Metabolic: Goal: Ability to maintain appropriate glucose levels will improve Outcome: Progressing   Problem: Nutritional: Goal: Maintenance of adequate nutrition will improve Outcome: Progressing Goal: Progress toward achieving an optimal weight will improve Outcome: Progressing   Problem: Skin Integrity: Goal: Risk for impaired skin integrity will decrease Outcome: Progressing    Problem: Tissue Perfusion: Goal: Adequacy of tissue perfusion will improve Outcome: Progressing   Problem: Education: Goal: Knowledge of General Education information will improve Description: Including pain rating scale, medication(s)/side effects and non-pharmacologic comfort measures Outcome: Progressing   Problem: Health Behavior/Discharge Planning: Goal: Ability to manage health-related needs will improve Outcome: Progressing   Problem: Clinical Measurements: Goal: Ability to maintain clinical measurements within normal limits will improve Outcome: Progressing Goal: Will remain free from infection Outcome: Progressing Goal: Diagnostic test results will improve Outcome: Progressing Goal: Respiratory complications will improve Outcome: Progressing Goal: Cardiovascular complication will be avoided Outcome: Progressing   Problem: Activity: Goal: Risk for activity intolerance will decrease Outcome: Progressing   Problem: Nutrition: Goal: Adequate nutrition will be maintained Outcome: Progressing   Problem: Coping: Goal: Level of anxiety will decrease Outcome: Progressing   Problem: Elimination: Goal: Will not experience complications related to bowel motility Outcome: Progressing Goal: Will not experience complications related to urinary retention Outcome: Progressing   Problem: Pain Managment: Goal: General experience of comfort will improve and/or be controlled Outcome: Progressing   Problem: Safety: Goal: Ability to remain free from injury will improve Outcome: Progressing   Problem: Skin Integrity: Goal: Risk for impaired skin integrity will decrease Outcome: Progressing

## 2024-03-14 NOTE — Progress Notes (Signed)
 Nutrition Follow-up  DOCUMENTATION CODES:   Not applicable  INTERVENTION:  -1 packet Juven BID, each packet provides 95 calories, 2.5 grams of protein (collagen), and 9.8 grams of carbohydrate (3 grams sugar); also contains 7 grams of L-arginine and L-glutamine, 300 mg vitamin C , 15 mg vitamin E, 1.2 mcg vitamin B-12, 9.5 mg zinc , 200 mg calcium , and 1.5 g  Calcium  Beta-hydroxy-Beta-methylbutyrate to support wound healing  - Magic Cup TID with meals, each supplement provides 290 kcal and 9 grams of protein   - Mighty Shake TID with meals, each supplement provides 330 kcals and 9 grams of protein   - Ensure Enlive po TID, each supplement provides 350 kcal and 20 grams of protein.   - Zinc  sulfate 220 mg daily x 14 days to support wound healing   - Vitamin C  500 mg BID x 30 days to support wound healing  NUTRITION DIAGNOSIS:   Inadequate oral intake related to decreased appetite as evidenced by per patient/family report.  GOAL:   Patient will meet greater than or equal to 90% of their needs  MONITOR:   PO intake, Supplement acceptance, Labs, Weight trends, Skin  REASON FOR ASSESSMENT:   Consult Assessment of nutrition requirement/status  ASSESSMENT:   83 year old male who presented to the ED on 03/04/24 from [facility] due to sacral pain. PMH of T2DM, HTN, HLD, CHF, atrial fibrillation, stroke with right-sided weakness, GERD, BPH, hypothyroidism, MGUS, transthyretin associated amyloid cardiomyopathy, chronic pancytopenia, recent hospital admission for COVID-19. Pt admitted with sepsis secondary to sacral pressure injury infection.  RD met with pt at bedside. Pt's daughter helps with feeding pt and works on increasing intake. Pt mixes cereal with Ensure Plus and supplements to increase kcals. Daughter wants to keep regular texture as she mashes up food for him and had trouble with minced and moist meats. Kitchen is out of magic cups at the moment so encouraged Ensure Plus TID.    Medications reviewed and include: Vit C, ferrous sulfate , SSI 0-15 units, Novolog , Semglee , Synthroid , Flagl, K phos , Miralax , senna, zinc   Labs reviewed: CBGs 154-278 x 24h   NUTRITION - FOCUSED PHYSICAL EXAM:  Flowsheet Row Most Recent Value  Orbital Region Mild depletion  Upper Arm Region Mild depletion  Thoracic and Lumbar Region Mild depletion  Buccal Region Mild depletion  Temple Region Mild depletion  Clavicle Bone Region Moderate depletion  Clavicle and Acromion Bone Region Moderate depletion  Scapular Bone Region Moderate depletion  Dorsal Hand No depletion  Patellar Region Mild depletion  Anterior Thigh Region Mild depletion  Posterior Calf Region Mild depletion  Edema (RD Assessment) Moderate  Hair Reviewed  Eyes Reviewed  Mouth Reviewed  Skin Reviewed  Nails Reviewed       Diet Order:   Diet Order             Diet regular Room service appropriate? Yes; Fluid consistency: Thin  Diet effective now                   EDUCATION NEEDS:   Education needs have been addressed  Skin:  Skin Assessment: Skin Integrity Issues: Skin Integrity Issues:: DTI, Unstageable, Other (Comment) DTI: L hip, L shoulder, R great toe Unstageable: mid coccyx Other: skin tear L elbow, MASD bilateral sacrum  Last BM:  4/23  Height:   Ht Readings from Last 1 Encounters:  03/04/24 5\' 11"  (1.803 m)   Weight:   Wt Readings from Last 1 Encounters:  03/04/24 68.5 kg   BMI:  Body mass index is 21.06 kg/m.  Estimated Nutritional Needs:   Kcal:  2000-2200  Protein:  95-100 grams  Fluid:  >2.0 L  Laren Player, MPH, RD, LDN Clinical Dietitian Contact information can be found at [facility].

## 2024-03-14 NOTE — Progress Notes (Signed)
 Daily Progress Note   Patient Name: Earl [name]       Date: 03/14/2024 DOB: [birth year]  Age: 83 y.o. 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    Palliative care evaluated Earl [name] earlier this month and have been asked to get re-involved for additional goals of care conversations.   Subjective: Chart reviewed including labs, progress notes, imaging from this and previous encounters.  Evaluated patient.  Daughter at bedside.  Discussed pain management and attempted to answer her questions. Patient complained of pain in sacrum, states he doesn't want anything "too strong".      Physical Exam Vitals and nursing note reviewed.  Constitutional:      Appearance: He is ill-appearing.             Vital Signs: BP 118/66 (BP Location: Left Arm)   Pulse 88   Temp 98.3 F (36.8 C)   Resp 16   Ht 5\' 11"  (1.803 m)   Wt 68.5 kg   SpO2 98%   BMI 21.06 kg/m  SpO2: SpO2: 98 % O2 Device: O2 Device: Room Air O2 Flow Rate:    Intake/output summary: No intake or output data in the 24 hours ending 03/14/24 0942 LBM: Last BM Date : 03/13/24 Baseline Weight: Weight: 68.5 kg Most recent weight: Weight: 68.5 kg         Patient Active  Problem List   Diagnosis Date Noted   Sepsis (HCC) 03/04/2024   Atrial flutter (HCC) 03/04/2024   GERD (gastroesophageal reflux disease) 03/04/2024   Sacral ulcer (HCC) 03/04/2024   History of hypertension 03/04/2024   Severe sepsis (HCC) 03/04/2024   Atrial fibrillation with RVR (HCC) 03/04/2024   Malnutrition of moderate degree 02/27/2024   Syncope, vasovagal 02/16/2024   COVID-19 virus infection 02/15/2024   Acute metabolic encephalopathy 11/14/2023   Failure to thrive in adult 11/14/2023   Generalized weakness  11/14/2023   Fall at home, initial encounter 11/14/2023   Muscle spasms of both lower extremities 11/14/2023   Pressure ulcer 11/14/2023   Chronic systolic CHF (congestive heart failure) (HCC) 09/11/2022   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   Acute on chronic congestive heart failure (HCC) 07/24/2021   Iron deficiency anemia 07/09/2021   Diabetic 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 (HCC) 05/17/2021   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   Acquired thrombophilia (HCC) 09/09/2019   Abdominal aortic atherosclerosis (HCC) by Abd CTscvn  2016 09/09/2019   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   Chronic anticoagulation 09/04/2017   Ataxia, post-stroke    History of CVA (cerebrovascular accident)    Hypothyroidism 09/13/2016   Vitamin D  deficiency 04/29/2014   Insulin  dependent type 2 diabetes mellitus (HCC)    BPH (benign prostatic hyperplasia)    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   Chronic anemia 11/07/2013   Essential hypertension 11/07/2013   Hyperlipidemia 11/07/2013   Paroxysmal atrial fibrillation (HCC) 09/18/2012    Palliative Care Assessment & Plan    Assessment/Recommendations/Plan  Change oxycodone  to q4hr prn  Encourage use of oxycodone  over dilaudid - continue IV dilaudid  prn as ordered   Code Status:   Code Status: Limited: Do not attempt resuscitation (DNR) -DNR-LIMITED -Do  Not Intubate/DNI    Prognosis:  Unable to determine  Discharge Planning: To Be Determined    Thank you for allowing the Palliative Medicine Team to assist in the care of this patient.  Total time:  60 minutes Prolonged billing:  Time includes:   Preparing to see the patient (e.g., review of tests) Obtaining and/or reviewing separately obtained history Performing a medically necessary appropriate examination and/or evaluation Counseling and educating the patient/family/caregiver Ordering medications, tests, or procedures Referring and communicating with other health care professionals (when not reported separately) Documenting clinical information in the electronic or other health record Independently interpreting results (not reported separately) and communicating results to the patient/family/caregiver Care coordination (not reported separately) Clinical documentation  Micki Alas, AGNP-C Palliative Medicine   Please contact Palliative Medicine Team phone at [phone] for questions and concerns.

## 2024-03-15 ENCOUNTER — Inpatient Hospital Stay (HOSPITAL_COMMUNITY)

## 2024-03-15 DIAGNOSIS — R000 Tachycardia, unspecified: Secondary | ICD-10-CM | POA: Diagnosis not present

## 2024-03-15 DIAGNOSIS — Z7189 Other specified counseling: Secondary | ICD-10-CM | POA: Diagnosis not present

## 2024-03-15 DIAGNOSIS — R6520 Severe sepsis without septic shock: Secondary | ICD-10-CM | POA: Diagnosis not present

## 2024-03-15 DIAGNOSIS — A419 Sepsis, unspecified organism: Secondary | ICD-10-CM | POA: Diagnosis not present

## 2024-03-15 DIAGNOSIS — Z8673 Personal history of transient ischemic attack (TIA), and cerebral infarction without residual deficits: Secondary | ICD-10-CM | POA: Diagnosis not present

## 2024-03-15 LAB — GLUCOSE, CAPILLARY
Glucose-Capillary: 119 mg/dL — ABNORMAL HIGH (ref 70–99)
Glucose-Capillary: 237 mg/dL — ABNORMAL HIGH (ref 70–99)
Glucose-Capillary: 156 mg/dL — ABNORMAL HIGH (ref 70–99)
Glucose-Capillary: 87 mg/dL (ref 70–99)

## 2024-03-15 MED ORDER — GUAIFENESIN ER 600 MG PO TB12
600.0000 mg | ORAL_TABLET | Freq: Two times a day (BID) | ORAL | Status: DC
  Administered 2024-03-15 – 2024-07-29 (×271): 600 mg via ORAL
  Filled 2024-03-15 (×272): qty 1

## 2024-03-15 NOTE — Progress Notes (Addendum)
 Daily Progress Note   Patient Name: Kalyb Pemble       Date: 03/15/2024 DOB: Jun 27, 1941  Age: 83 y.o. 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    Palliative care evaluated Cleven earlier this month and have been asked to get re-involved for additional goals of care conversations.   Subjective: Chart reviewed including labs, progress notes, imaging from this and previous encounters.  Evaluated patient.  Daughter at bedside.  Patient was receiving wound care at time of my visit.  Discussed with RN- he hasn't been too sedated today.   Physical Exam Vitals and nursing note reviewed.  Constitutional:      Appearance: He is ill-appearing.             Vital Signs: BP (!) 114/58 (BP Location: Left Arm)   Pulse 91   Temp 98.8 F (37.1 C) (Oral)   Resp 18   Ht 5\' 11"  (1.803 m)   Wt 75.3 kg   SpO2 100%   BMI 23.15 kg/m  SpO2: SpO2: 100 % O2 Device: O2 Device: Room Air O2 Flow Rate:    Intake/output summary:  Intake/Output Summary (Last 24 hours) at 03/15/2024 1632 Last data filed at 03/14/2024 2016 Gross per 24 hour  Intake --  Output 600 ml  Net -600 ml   LBM: Last BM Date : 03/14/24 Baseline Weight: Weight: 68.5 kg Most  recent weight: Weight: 75.3 kg         Patient Active Problem List   Diagnosis Date Noted   Sepsis (HCC) 03/04/2024   Atrial flutter (HCC) 03/04/2024   GERD (gastroesophageal reflux disease) 03/04/2024   Sacral ulcer (HCC) 03/04/2024   History of hypertension 03/04/2024   Severe sepsis (HCC) 03/04/2024   Atrial fibrillation with RVR (HCC) 03/04/2024   Malnutrition of moderate degree 02/27/2024   Syncope, vasovagal 02/16/2024   COVID-19 virus infection 02/15/2024  Acute metabolic encephalopathy 11/14/2023   Failure to thrive in adult 11/14/2023   Generalized weakness 11/14/2023   Fall at home, initial encounter 11/14/2023   Muscle spasms of both lower extremities 11/14/2023   Pressure ulcer 11/14/2023   Chronic systolic CHF (congestive heart failure) (HCC) 09/11/2022   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   Acute on chronic congestive heart failure (HCC) 07/24/2021   Iron deficiency anemia 07/09/2021   Diabetic 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 (HCC) 05/17/2021   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   Acquired thrombophilia (HCC) 09/09/2019   Abdominal aortic atherosclerosis (HCC) by Abd CTscvn  2016 09/09/2019   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   Chronic anticoagulation 09/04/2017   Ataxia, post-stroke    History of CVA (cerebrovascular accident)    Hypothyroidism 09/13/2016   Vitamin D  deficiency 04/29/2014   Insulin  dependent type 2 diabetes mellitus (HCC)    BPH (benign prostatic hyperplasia)    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   Chronic anemia 11/07/2013   Essential hypertension 11/07/2013   Hyperlipidemia 11/07/2013   Paroxysmal atrial fibrillation (HCC) 09/18/2012    Palliative Care Assessment & Plan    Assessment/Recommendations/Plan  Continue oxycontin  at current dose Continue oxycodone  prn Encourage use of prn oxycodone  over dilaudid - continue IV dilaudid  prn as  ordered   Code Status:   Code Status: Limited: Do not attempt resuscitation (DNR) -DNR-LIMITED -Do Not Intubate/DNI    Prognosis:  Unable to determine  Discharge Planning: To Be Determined    Thank you for allowing the Palliative Medicine Team to assist in the care of this patient.  Total time:  60 minutes Prolonged billing:  Time includes:   Preparing to see the patient (e.g., review of tests) Obtaining and/or reviewing separately obtained history Performing a medically necessary appropriate examination and/or evaluation Counseling and educating the patient/family/caregiver Ordering medications, tests, or procedures Referring and communicating with other health care professionals (when not reported separately) Documenting clinical information in the electronic or other health record Independently interpreting results (not reported separately) and communicating results to the patient/family/caregiver Care coordination (not reported separately) Clinical documentation  Micki Alas, AGNP-C Palliative Medicine   Please contact Palliative Medicine Team phone at [phone] for questions and concerns.

## 2024-03-15 NOTE — Plan of Care (Signed)
  Problem: Fluid Volume: Goal: Hemodynamic stability will improve Outcome: Progressing   Problem: Clinical Measurements: Goal: Diagnostic test results will improve Outcome: Progressing Goal: Signs and symptoms of infection will decrease Outcome: Progressing   Problem: Respiratory: Goal: Ability to maintain adequate ventilation will improve Outcome: Progressing   Problem: Education: Goal: Knowledge of risk factors and measures for prevention of condition will improve Outcome: Progressing   Problem: Coping: Goal: Psychosocial and spiritual needs will be supported Outcome: Progressing   Problem: Respiratory: Goal: Will maintain a patent airway Outcome: Progressing Goal: Complications related to the disease process, condition or treatment will be avoided or minimized Outcome: Progressing   Problem: Education: Goal: Ability to describe self-care measures that may prevent or decrease complications (Diabetes Survival Skills Education) will improve Outcome: Progressing Goal: Individualized Educational Video(s) Outcome: Progressing   Problem: Coping: Goal: Ability to adjust to condition or change in health will improve Outcome: Progressing   Problem: Fluid Volume: Goal: Ability to maintain a balanced intake and output will improve Outcome: Progressing   Problem: Health Behavior/Discharge Planning: Goal: Ability to identify and utilize available resources and services will improve Outcome: Progressing Goal: Ability to manage health-related needs will improve Outcome: Progressing   Problem: Metabolic: Goal: Ability to maintain appropriate glucose levels will improve Outcome: Progressing   Problem: Nutritional: Goal: Maintenance of adequate nutrition will improve Outcome: Progressing Goal: Progress toward achieving an optimal weight will improve Outcome: Progressing   Problem: Skin Integrity: Goal: Risk for impaired skin integrity will decrease Outcome: Progressing    Problem: Tissue Perfusion: Goal: Adequacy of tissue perfusion will improve Outcome: Progressing   Problem: Education: Goal: Knowledge of General Education information will improve Description: Including pain rating scale, medication(s)/side effects and non-pharmacologic comfort measures Outcome: Progressing   Problem: Health Behavior/Discharge Planning: Goal: Ability to manage health-related needs will improve Outcome: Progressing   Problem: Clinical Measurements: Goal: Ability to maintain clinical measurements within normal limits will improve Outcome: Progressing Goal: Will remain free from infection Outcome: Progressing Goal: Diagnostic test results will improve Outcome: Progressing Goal: Respiratory complications will improve Outcome: Progressing Goal: Cardiovascular complication will be avoided Outcome: Progressing   Problem: Activity: Goal: Risk for activity intolerance will decrease Outcome: Progressing   Problem: Nutrition: Goal: Adequate nutrition will be maintained Outcome: Progressing   Problem: Coping: Goal: Level of anxiety will decrease Outcome: Progressing   Problem: Elimination: Goal: Will not experience complications related to bowel motility Outcome: Progressing Goal: Will not experience complications related to urinary retention Outcome: Progressing   Problem: Pain Managment: Goal: General experience of comfort will improve and/or be controlled Outcome: Progressing   Problem: Safety: Goal: Ability to remain free from injury will improve Outcome: Progressing   Problem: Skin Integrity: Goal: Risk for impaired skin integrity will decrease Outcome: Progressing

## 2024-03-15 NOTE — Consult Note (Addendum)
 WOC Nurse wound follow up Wound type: Pressure injury. Discussed the progressing of the wound with Physical therapy. Measurement: 8.1 cm x 4.8 cm x 2.8 cm (see PT notes) Wound bed: 60% red, 40% yellow (see flowsheet and Pt notes). Hydrotherapy showed a new tunneling draining pus. Drainage (amount, consistency, odor) Moderated amount. Periwound: Macerated with scar discolor tissue, rolled edges nonattached. Dressing procedure/placement/frequency: Changed the topical dressing to Aquacel #[id number], filling the tunneling. Cover with foam dressing, change daily until the next WOC visit on MON (04/28).  Uptade the wound care order: 3 - Sacral Unstageable PI Cleanse with Vashe #[id number]. Apply Aquacel [id number], covering the wound bed and filling with a strip of the Aquacel on the tunneling with a swab or clamp help. Apply sacral foam dressing, change daily or PRN soiling.  The WOC team will visit this pt in person on MON.  Please reconsult if further assistance is needed. Thank-you,  Rachel Budds BSN, RN, [employer], WOC  (Pager: [phone])

## 2024-03-15 NOTE — Progress Notes (Signed)
 PROGRESS NOTE  Earl [name]  ATF:[id number] DOB: [birth year] DOA: 03/04/2024 PCP: [name], MD   Brief Narrative: Patient is a 83 year old male with history of paroxysmal A-fib, insulin -dependent diabetes, hypertension, hyperlipidemia, CVA with residual right-sided weakness, MGUS, GERD, BPH, hypothyroidism, HFrEF with EF of 40 to 45%, chronic pancytopenia who presented to the emergency department from SNF with complaint of shortness of breath, wound, pain in the sacral area.  He was just discharged from here to [facility] rehab 4 days ago.  Hospital course was remarkable for acute metabolic encephalopathy, hypotension, hypoglycemia.  On presentation, he was hypotensive, EKG showed A-fib with RVR, tachypnea but saturating fine on room air.  Respiratory panel negative for COVID.  PCCM consulted for persistent hypotension.  Cardiology also consulted.  General surgery consulted for sacral ulcer.  Started on broad-spectrum antibiotics, amiodarone  drip.  Status post bedside excisional debridement of the sacral wound on 4/15 and 4/17.  Wound care, cardiology, general surgery following.  No plan for further operative intervention for sacral ulcer.  ID consulted for assistance with antibiotics.Now on oral abx.  Plan for SNF .Daughter does not think that he is ready for DC and she anticipates all his pressure wounds to heal before discharge.TOC alerted  Assessment & Plan:  Principal Problem:   Severe sepsis (HCC) Active Problems:   Paroxysmal atrial fibrillation (HCC)   Sepsis (HCC)   Atrial flutter (HCC)   Sacral ulcer (HCC)   MGUS (monoclonal gammopathy of unknown significance)   Essential hypertension   Hyperlipidemia   Insulin  dependent type 2 diabetes mellitus (HCC)   History of CVA (cerebrovascular accident)   Cardiac amyloidosis (HCC)   Chronic systolic CHF (congestive heart failure) (HCC)   GERD (gastroesophageal reflux disease)   History of hypertension   Atrial fibrillation with  RVR (HCC)  Sepsis secondary to sacral pressure wound/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  Hypotensive, encephalopathic, hypoglycemic, elevated lactate, leukocytosis on presentation.  Started on IV fluid, broad spectrum antibiotics.  Follow-up cultures:NGTD Code sepsis was activated.  PCCM recommended to continue midodrine . Blood pressure has been better now with systolic in the range of 100s.   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  General surgery consulted,   Continue current antibiotics.  PT consulted for hydrotherapy.  Continue zinc , vitamin C .Status post bedside excisional debridement of the sacral wound on 4/15 and 4/17.  Observed to have purulent drainage.  Surgery recommended to continue twice daily wet-to-dry dressing changes with Dakin's.  General surgery not planning for further surgical debridement.  Wound care nurse following for other pressure ulcers.  Needs to continue wound care on discharge.  ID recommended cefadroxil , Flagyl  for a week  History of paroxysmal A-fib: Presented with A-fib with RVR.  Toprol  was discontinued on last admission.  Started on amiodarone  drip.  Cardiology was following.   Digoxin  discontinued.  Amiodarone  changed to oral.  Eliquis   restarted.  Currently rate is well-controlled  History of HFrEF/transthyretin cardiac amyloidosis/nonischemic cardiomyopathy/hypotension: Presented with hypotension.  Takes Entresto , Toprol  at home,. Currently on hold.  Given IV fluids initially.    Cardiology team was following,continue midodrine   Insulin -dependent diabetes type 2: Presented with hyperglycemia.  Recent A1c in the range of 8.   Diabetic coordinator consulted.  Currently on sliding scale, Lantus   History of CVA: Has a right-sided residual weakness.  On Eliquis , Lipitor.  GERD: Continue Protonix  Anemia of chronic disease: Continue iron supplementation  Hypothyroidism: Continue levothyroxine   Recent history of COVID: COVID was positive on 02/14/2024.  Still positive on this admission.  Out of 21 days window  Severe protein calorie malnutrition/suspicion for dysphagia/hypophosphatemia: Speech therapy consulted and following,currently on regular diet.  Phosphorus being  supplemented and corrected.  Dietitian was consulted.  Constipation: Continue bowel regimen.  Goals of care: Very deconditioned elderly male with bedbound status, multiple comorbidities, sacral pressure ulcer.  CODE STATUS DNR.  Palliative care consulted for goals of care.  DNR.  PT/OT consulted.  Recommended SNF on discharge.  TOC following   Nutrition Problem: Inadequate oral intake Etiology: decreased appetite Pressure Injury 02/26/24 Coccyx Mid Unstageable - Full thickness tissue loss in which the base of the injury is covered by slough (yellow, tan, gray, green or brown) and/or eschar (tan, brown or black) in the wound bed. (Active)  02/26/24 0916  Location: Coccyx  Location Orientation: Mid  Staging: Unstageable - Full thickness tissue loss in which the base of the injury is covered by slough (yellow, tan, gray, green or brown) and/or eschar (tan, brown or black) in the wound bed.  Wound Description (Comments):   Present on Admission: No  Dressing Type Foam - Lift dressing to assess site every shift 03/13/24 1600     Pressure Injury 02/26/24 Toe (Comment  which one) Anterior;Right Deep Tissue Pressure Injury - Purple or maroon localized area of discolored intact skin or blood-filled blister due to damage of underlying soft tissue from pressure and/or shear. right gre (Active)  02/26/24 1215  Location: Toe (Comment  which one)  Location  Orientation: Anterior;Right  Staging: Deep Tissue Pressure Injury - Purple or maroon localized area of discolored intact skin or blood-filled blister due to damage of underlying soft tissue from pressure and/or shear.  Wound Description (Comments): right great toe  Present on Admission:   Dressing Type Foam - Lift dressing to assess site every shift 03/12/24 2000     Pressure Injury 03/06/24 Hip Anterior;Left;Proximal Deep Tissue Pressure Injury - Purple or maroon localized area of discolored intact skin or blood-filled blister due to damage of underlying soft tissue from pressure and/or shear. Fluid filled blister w (Active)  03/06/24 1612  Location: Hip  Location Orientation: Anterior;Left;Proximal  Staging: Deep Tissue Pressure Injury - Purple or maroon localized area of discolored intact skin or blood-filled blister due to damage of underlying soft tissue from pressure and/or shear.  Wound Description (Comments): Fluid filled blister with purple discoloration  Present on Admission: No  Dressing Type Foam - Lift dressing to assess site every shift 03/12/24 2000     Pressure Injury 03/06/24 Shoulder Left Deep Tissue Pressure Injury - Purple or maroon localized area of discolored intact skin or blood-filled blister due to damage of underlying soft tissue from pressure and/or shear. dark purple discoloration to should (Active)  03/06/24 1613  Location: Shoulder  Location Orientation: Left  Staging: Deep Tissue Pressure Injury - Purple or maroon localized area of discolored intact skin or blood-filled blister due to damage of underlying soft tissue from pressure and/or shear.  Wound Description (Comments): dark purple discoloration to shoulder  Present on Admission: No  Dressing Type Foam - Lift dressing to assess site every shift 03/12/24 2000    DVT prophylaxis:SCDs Start: 03/04/24 2151 Place TED hose Start: 03/04/24 2151 apixaban  (ELIQUIS ) tablet  5 mg     Code Status: Limited: Do not  attempt resuscitation (DNR) -DNR-LIMITED -Do Not Intubate/DNI   Family Communication: Daughter at bedside on 4/25  Patient status:Inpatient  Patient is from :home  Anticipated discharge to:SNF   Estimated DC date:whenever possible   Consultants:Cardiology,pccm ,surgery  Procedures:None yet  Antimicrobials:  Anti-infectives (From admission, onward)    Start     Dose/Rate Route Frequency Ordered Stop   03/14/24 1000  metroNIDAZOLE  (FLAGYL ) tablet 500 mg        500 mg Oral Every 12 hours 03/14/24 0803 03/20/24 0959   03/13/24 1000  cefadroxil  (DURICEF) capsule 1,000 mg        1,000 mg Oral 2 times daily 03/12/24 1632 03/20/24 0959   03/07/24 2200  metroNIDAZOLE  (FLAGYL ) tablet 500 mg        500 mg Oral Every 12 hours 03/07/24 0840 03/11/24 2258   03/05/24 1930  vancomycin  (VANCOREADY) IVPB 1250 mg/250 mL  Status:  Discontinued        1,250 mg 166.7 mL/hr over 90 Minutes Intravenous Every 24 hours 03/04/24 2205 03/04/24 2224   03/05/24 1930  Vancomycin  (VANCOCIN ) 1,250 mg in sodium chloride  0.9 % 250 mL IVPB  Status:  Discontinued        1,250 mg 166.7 mL/hr over 90 Minutes Intravenous Every 24 hours 03/04/24 2224 03/05/24 0717   03/05/24 1930  vancomycin  (VANCOREADY) IVPB 1500 mg/300 mL  Status:  Discontinued        1,500 mg 150 mL/hr over 120 Minutes Intravenous Every 24 hours 03/05/24 0717 03/12/24 1636   03/05/24 1400  ceFEPIme  (MAXIPIME ) 2 g in sodium chloride  0.9 % 100 mL IVPB  Status:  Discontinued        2 g 200 mL/hr over 30 Minutes Intravenous Every 8 hours 03/05/24 0717 03/12/24 1632   03/05/24 0600  ceFEPIme  (MAXIPIME ) 2 g in sodium chloride  0.9 % 100 mL IVPB  Status:  Discontinued        2 g 200 mL/hr over 30 Minutes Intravenous Every 12 hours 03/04/24 2205 03/05/24 0717   03/05/24 0000  metroNIDAZOLE  (FLAGYL ) IVPB 500 mg  Status:  Discontinued        500 mg 100 mL/hr over 60 Minutes Intravenous Every 12 hours 03/04/24 2206 03/04/24 2209   03/04/24 1730   aztreonam (AZACTAM) 2 g in sodium chloride  0.9 % 100 mL IVPB  Status:  Discontinued        2 g 200 mL/hr over 30 Minutes Intravenous  Once 03/04/24 1715 03/04/24 1724   03/04/24 1730  metroNIDAZOLE  (FLAGYL ) IVPB 500 mg  Status:  Discontinued        500 mg 100 mL/hr over 60 Minutes Intravenous Every 12 hours 03/04/24 1715 03/07/24 0840   03/04/24 1730  vancomycin  (VANCOREADY) IVPB 1500 mg/300 mL        1,500 mg 150 mL/hr over 120 Minutes Intravenous  Once 03/04/24 1715 03/04/24 2153   03/04/24 1730  ceFEPIme  (MAXIPIME ) 2 g in sodium chloride  0.9 % 100 mL IVPB        2 g 200 mL/hr over 30 Minutes Intravenous  Once 03/04/24 1724 03/04/24 1826       Subjective: Patient seen and examined at bedside today.  Lying on bed.  Mostly comfortable.  Complains of some pain in the sacral ulcer.  But overall looks comfortable, not in any Distress, on room air.  Alert and oriented.  He was being fed by his daughter.  Long discussion held  with daughter at bedside today She had a lot of complaints regarding his pressure wounds.  She said the pressure wounds management instruction was not relayed to the floor nurses.  She thinks that his father is not stable for discharge anytime soon.  She is interested in waiting at least 1 to 2 months in the hospital before the wounds can heal.  I explained that the patient is already on oral antibiotics and he is hemodynamically stable.  Wounds can be possibly taken care of at SNF  Objective: Vitals:   03/14/24 1937 03/15/24 0417 03/15/24 0500 03/15/24 0801  BP: (!) 143/56 (!) 116/56  (!) 130/53  Pulse: 88 90  91  Resp: 18 18    Temp: 98.9 F (37.2 C) 99.7 F (37.6 C)  99.2 F (37.3 C)  TempSrc: Oral Oral  Oral  SpO2: 100% 97%  97%  Weight:   75.3 kg   Height:        Intake/Output Summary (Last 24 hours) at 03/15/2024 1117 Last data filed at 03/14/2024 2016 Gross per 24 hour  Intake 300 ml  Output 600 ml  Net -300 ml    Filed Weights   03/04/24 1656  03/15/24 0500  Weight: 68.5 kg 75.3 kg    Examination:  General exam: Overall comfortable, not in distress, very deconditioned, chronically ill looking HEENT: PERRL Respiratory system:  no wheezes or crackles  Cardiovascular system: S1 & S2 heard, RRR.  Gastrointestinal system: Abdomen is nondistended, soft and nontender. Central nervous system: Alert and oriented Extremities: No obvious edema, no clubbing ,no cyanosis Skin: Sacral pressure  ulcer, scattered pressure ulcers GU: Foley   Data Reviewed: I have personally reviewed following labs and imaging studies  CBC: Recent Labs  Lab 03/09/24 0832 03/10/24 0555 03/11/24 0657 03/12/24 0652 03/14/24 0708  WBC 10.2 12.2* 13.8* 13.0* 11.9*  HGB 9.3* 8.7* 8.6* 8.4* 8.8*  HCT 26.4* 25.0* 24.8* 24.7* 26.0*  MCV 83.3 83.6 83.8 84.9 85.0  PLT 272 265 319 342 410*   Basic Metabolic Panel: Recent Labs  Lab 03/09/24 0832 03/10/24 0555 03/11/24 0657 03/12/24 0652 03/14/24 0708  NA  --  134*  --  135  --   K  --  4.0  --  4.1  --   CL  --  105  --  108  --   CO2  --  22  --  23  --   GLUCOSE  --  149*  --  157*  --   BUN  --  16  --  17  --   CREATININE  --  0.80  --  0.79  --   CALCIUM   --  7.4*  --  7.5*  --   PHOS 2.3* 2.0* 3.0 2.9 2.9     No results found for this or any previous visit (from the past 240 hours).    Radiology Studies: No results found.    Scheduled Meds:  acetaminophen   500 mg Oral TID   amiodarone   200 mg Oral BID   Followed by   Cecily Cohen ON 03/17/2024] amiodarone   200 mg Oral Daily   apixaban   5 mg Oral BID   ascorbic acid   500 mg Oral BID   atorvastatin   40 mg Oral Daily   cefadroxil   1,000 mg Oral BID   Chlorhexidine  Gluconate Cloth  6 each Topical Daily   collagenase    Topical Daily   feeding supplement  237 mL Oral TID BM   ferrous sulfate   325 mg Oral QHS   insulin  aspart  0-15 Units Subcutaneous TID WC   insulin  aspart  0-5 Units Subcutaneous QHS   insulin  aspart  3 Units  Subcutaneous TID WC   insulin  glargine-yfgn  15 Units Subcutaneous Daily   levothyroxine   50 mcg Oral Q0600   liver oil-zinc  oxide   Topical BID   metroNIDAZOLE   500 mg Oral Q12H   midodrine   5 mg Oral TID WC   nutrition supplement (JUVEN)  1 packet Oral BID BM   oxyCODONE   10 mg Oral Q12H   phosphorus  500 mg Oral TID   polyethylene glycol  17 g Oral BID   senna-docusate  1 tablet Oral BID   sodium chloride  flush  3 mL Intravenous Q12H   Tafamidis   1 capsule Oral Daily   zinc  sulfate (50mg  elemental zinc )  220 mg Oral Daily   Continuous Infusions:     LOS: 11 days   Leona Rake, MD [facility] P4/25/2025, 11:17 AM

## 2024-03-15 NOTE — Progress Notes (Signed)
 Physical Therapy Wound Treatment Patient Details  Name: Earl [name] MRN: [id number] Date of Birth: [birth year]  Today's Date: 03/15/2024 Time: 7829-5621 Time Calculation (min): 54 min  Subjective  Subjective Assessment Subjective: Pt and daughter pleasant and agreeable to wound therapy Patient and Family Stated Goals: Heal wound Date of Onset:  (Unknown) Prior Treatments: Dressing changes, off weighting  Pain Score:  Premedicated for pain  Wound Assessment  Pressure Injury 02/26/24 Coccyx Mid Unstageable - Full thickness tissue loss in which the base of the injury is covered by slough (yellow, tan, gray, green or brown) and/or eschar (tan, brown or black) in the wound bed. (Active)  Wound Image   03/15/24 1432  Dressing Type Foam - Lift dressing to assess site every shift;Gauze (Comment) 03/15/24 1432  Dressing Clean, Dry, Intact;Changed 03/15/24 1432  Dressing Change Frequency Daily 03/15/24 1432  State of Healing Early/partial granulation 03/15/24 1432  Site / Wound Assessment Pink;Red;Yellow 03/15/24 1432  % Wound base Red or Granulating 60% 03/15/24 1432  % Wound base Yellow/Fibrinous Exudate 40% 03/15/24 1432  % Wound base Black/Eschar 0% 03/15/24 1432  % Wound base Other/Granulation Tissue (Comment) 0% 03/15/24 1432  Peri-wound Assessment Pink 03/15/24 1432  Wound Length (cm) 8.1 cm 03/15/24 1432  Wound Width (cm) 4.8 cm 03/15/24 1432  Wound Depth (cm) 2.8 cm 03/15/24 1432  Wound Surface Area (cm^2) 38.88 cm^2 03/15/24 1432  Wound Volume (cm^3) 108.86 cm^3 03/15/24 1432  Tunneling (cm) 12:00-2:00 4.5 cm; 2:00 5.2 cm 03/15/24 1432  Undermining (cm) 0 03/15/24 1432  Margins Unattached edges (unapproximated) 03/15/24 1432  Drainage Amount Minimal 03/15/24 1432  Drainage Description Purulent 03/15/24 1432  Treatment Debridement (Selective);Irrigation;Packing (Saline gauze) 03/15/24 1432      Selective Debridement (non-excisional) Selective Debridement (non-excisional)  - Location: Sacrum Selective Debridement (non-excisional) - Tools Used: Forceps, Scalpel, Scissors Selective Debridement (non-excisional) - Tissue Removed: Brown and yellow unviable tissue    Wound Assessment and Plan  Wound Therapy - Assess/Plan/Recommendations Wound Therapy - Clinical Statement: Wound bed that is easily visible appears improved. There continues to be purulent drainage that appears to be originating from the tunnel. Rolled to the R today to visualize the tunnel better. Able to remove a sizable amount of necrotic tissue from base of tunnel. Noted new wound orders per WOC regarding Xeroform. However, opted to pack tunnel. Discussed with WOC RN and bedside RN and orders being changed to cleansing with Vashe, packing wound bed and tunnel with Aquacel, and topping with sacral foam.  This patient will benefit from continued selective removal of unviable tissue, to decrease bioburden, and promote wound bed healing. Wound Therapy - Functional Problem List: Decreased tolerance for position changes or OOB. Factors Delaying/Impairing Wound Healing: Diabetes Mellitus, Immobility, Infection - systemic/local, Multiple medical problems Hydrotherapy Plan: Debridement, Dressing change, Patient/family education Wound Therapy - Frequency: 2X / week Wound Therapy - Follow Up Recommendations: dressing changes by RN  Wound Therapy Goals- Improve the function of patient's integumentary system by progressing the wound(s) through the phases of wound healing (inflammation - proliferation - remodeling) by: Wound Therapy Goals - Improve the function of patient's integumentary system by progressing the wound(s) through the phases of wound healing by: Decrease Necrotic Tissue to: 20% Decrease Necrotic Tissue - Progress: Progressing toward goal Increase Granulation Tissue to: 80% Increase Granulation Tissue - Progress: Progressing toward goal Goals/treatment plan/discharge plan were made with and agreed upon  by patient/family: Yes Time For Goal Achievement: 7 days Wound Therapy - Potential for Goals: Good  Goals  will be updated until maximal potential achieved or discharge criteria met.  Discharge criteria: when goals achieved, discharge from hospital, MD decision/surgical intervention, no progress towards goals, refusal/missing three consecutive treatments without notification or medical reason.  GP     Charges PT Wound Care Charges $Wound Debridement up to 20 cm: < or equal to 20 cm $PT Hydrotherapy Dressing: 3 dressings $PT Hydrotherapy Visit: 1 Visit       [name] 03/15/2024, 3:14 PM  Simone Dubois, PT, DPT Acute Rehabilitation Services Secure Chat Preferred Office: [phone]

## 2024-03-16 DIAGNOSIS — R6520 Severe sepsis without septic shock: Secondary | ICD-10-CM | POA: Diagnosis not present

## 2024-03-16 DIAGNOSIS — A419 Sepsis, unspecified organism: Secondary | ICD-10-CM | POA: Diagnosis not present

## 2024-03-16 LAB — CBC
HCT: 26.1 % — ABNORMAL LOW (ref 39.0–52.0)
Hemoglobin: 8.9 g/dL — ABNORMAL LOW (ref 13.0–17.0)
MCH: 29.1 pg (ref 26.0–34.0)
MCHC: 34.1 g/dL (ref 30.0–36.0)
MCV: 85.3 fL (ref 80.0–100.0)
Platelets: 497 10*3/uL — ABNORMAL HIGH (ref 150–400)
RBC: 3.06 MIL/uL — ABNORMAL LOW (ref 4.22–5.81)
RDW: 15.7 % — ABNORMAL HIGH (ref 11.5–15.5)
WBC: 11.5 10*3/uL — ABNORMAL HIGH (ref 4.0–10.5)
nRBC: 0 % (ref 0.0–0.2)

## 2024-03-16 LAB — GLUCOSE, CAPILLARY
Glucose-Capillary: 186 mg/dL — ABNORMAL HIGH (ref 70–99)
Glucose-Capillary: 177 mg/dL — ABNORMAL HIGH (ref 70–99)
Glucose-Capillary: 138 mg/dL — ABNORMAL HIGH (ref 70–99)
Glucose-Capillary: 146 mg/dL — ABNORMAL HIGH (ref 70–99)

## 2024-03-16 LAB — PHOSPHORUS: Phosphorus: 3 mg/dL (ref 2.5–4.6)

## 2024-03-16 LAB — BASIC METABOLIC PANEL WITH GFR
Anion gap: 7 (ref 5–15)
BUN: 20 mg/dL (ref 8–23)
CO2: 25 mmol/L (ref 22–32)
Calcium: 8 mg/dL — ABNORMAL LOW (ref 8.9–10.3)
Chloride: 105 mmol/L (ref 98–111)
Creatinine, Ser: 0.77 mg/dL (ref 0.61–1.24)
GFR, Estimated: >60 mL/min (ref >60)
Glucose, Bld: 191 mg/dL — ABNORMAL HIGH (ref 70–99)
Potassium: 4.2 mmol/L (ref 3.5–5.1)
Sodium: 137 mmol/L (ref 135–145)

## 2024-03-16 MED ORDER — DOXYCYCLINE HYCLATE 100 MG PO TABS
100.0000 mg | ORAL_TABLET | Freq: Two times a day (BID) | ORAL | Status: AC
  Administered 2024-03-16 – 2024-03-20 (×10): 100 mg via ORAL
  Filled 2024-03-16 (×10): qty 1

## 2024-03-16 MED ORDER — CEFTRIAXONE SODIUM 2 G IJ SOLR
2.0000 g | INTRAMUSCULAR | Status: AC
  Administered 2024-03-16 – 2024-03-20 (×5): 2 g via INTRAVENOUS
  Filled 2024-03-16 (×5): qty 20

## 2024-03-16 MED ORDER — FUROSEMIDE 20 MG PO TABS
20.0000 mg | ORAL_TABLET | Freq: Every day | ORAL | Status: DC
  Administered 2024-03-16 – 2024-05-05 (×50): 20 mg via ORAL
  Filled 2024-03-16 (×51): qty 1

## 2024-03-16 NOTE — Progress Notes (Signed)
 PROGRESS NOTE  Earl [name]  ZOX:[id number] DOB: 1941/04/30 DOA: 03/04/2024 PCP: [name], MD   Brief Narrative: Patient is a 83 year old male with history of paroxysmal A-fib, insulin -dependent diabetes, hypertension, hyperlipidemia, CVA with residual right-sided weakness, MGUS, GERD, BPH, hypothyroidism, HFrEF with EF of 40 to 45%, chronic pancytopenia who presented to the emergency department from SNF with complaint of shortness of breath, wound, pain in the sacral area.  He was just discharged from here to [facility] rehab 4 days ago.  Hospital course was remarkable for acute metabolic encephalopathy, hypotension, hypoglycemia.  On presentation, he was hypotensive, EKG showed A-fib with RVR, tachypnea but saturating fine on room air.  Respiratory panel negative for COVID.  PCCM consulted for persistent hypotension.  Cardiology also consulted.  General surgery consulted for sacral ulcer.  Started on broad-spectrum antibiotics, amiodarone  drip.  Status post bedside excisional debridement of the sacral wound on 4/15 and 4/17.  Wound care, cardiology, general surgery following.  No plan for further operative intervention for sacral ulcer.  Now hemodynamically stable.Prolonged hospitalization.  Plan for SNF   Assessment & Plan:  Principal Problem:   Severe sepsis (HCC) Active Problems:   Paroxysmal atrial fibrillation (HCC)   Sepsis (HCC)   Atrial flutter (HCC)   Sacral ulcer (HCC)   MGUS (monoclonal gammopathy of unknown significance)   Essential hypertension   Hyperlipidemia   Insulin  dependent type 2 diabetes mellitus (HCC)   History of CVA (cerebrovascular accident)   Cardiac amyloidosis (HCC)   Chronic systolic CHF (congestive heart failure) (HCC)   GERD (gastroesophageal reflux disease)   History of hypertension   Atrial fibrillation with RVR (HCC)  Sepsis secondary to sacral pressure wound/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  Hypotensive, encephalopathic, hypoglycemic, elevated lactate, leukocytosis on presentation.  Started on IV fluid, broad spectrum antibiotics.  Cultures:NGTD Code sepsis was activated.  PCCM recommended to continue midodrine . Blood pressure has been better now with systolic in the range of 100s.   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  General surgery consulted,   Continue current antibiotics.  PT consulted for hydrotherapy. Started  zinc , vitamin C .Status post bedside excisional debridement of the sacral wound on 4/15 and 4/17. Surgery recommended to continue twice daily wet-to-dry dressing changes with Dakin's.  General surgery not planning for further surgical debridement.  Wound care nurse following for  pressure ulcers.  Needs to continue wound care on discharge.  ID had  recommended cefadroxil , Flagyl  for a week.  History of paroxysmal A-fib: Presented with A-fib with RVR.  Toprol  was discontinued on last admission.  Started on amiodarone  drip.  Cardiology was following.   Digoxin  discontinued.  Amiodarone  changed to oral.  Eliquis   restarted.  Currently rate is well-controlled:in and out of A-fib  History of HFrEF/transthyretin cardiac amyloidosis/nonischemic cardiomyopathy/hypotension: Presented with hypotension.  Takes Entresto , Toprol  at home,. Currently on hold.  Given IV fluids initially.    Cardiology team was following,continue midodrine .  Started on low-dose Lasix .  Suspected left lower lobe pneumonia: Had some cough and mucus production on 4/25.  Chest x-ray showed lower lobe consolidation but patient has been on room air, not in any kind of respiratory distress, no fever, leukocytosis  improving.Cud be aspiration.  He was  already on oral antibiotics.  Started on IV ceftriaxone, doxycycline ,plan for 5 days course.  Antibiotic can be changed to oral if he is discharged  Insulin -dependent diabetes type 2: Presented with hyperglycemia.  Recent A1c in the range of 8.  Diabetic coordinator consulted.  Currently on sliding scale, Lantus   History of CVA: Has a right-sided residual weakness.  On Eliquis , Lipitor.  GERD: Continue Protonix  Anemia of chronic disease: Continue iron supplementation  Hypothyroidism: Continue levothyroxine   Recent history of COVID: COVID was positive on 02/14/2024.  Still positive on this admission.  No need for isolation  Severe protein calorie malnutrition/suspicion for dysphagia/hypophosphatemia: Speech therapy consulted and following,currently on regular diet.  Phosphorus being  supplemented and monitored.  Dietitian was consulted.  Constipation: Continue bowel regimen.  Goals of care: Very deconditioned elderly male with bedbound status, multiple comorbidities, sacral pressure ulcer.  CODE STATUS DNR.  Palliative care consulted for goals of care.   PT/OT consulted.  Recommended SNF on discharge.  TOC following for placement   Nutrition Problem: Inadequate oral intake Etiology: decreased appetite Pressure Injury 02/26/24 Coccyx Mid Unstageable - Full thickness tissue loss in which the base of the injury is covered by slough (yellow, tan, gray, green or brown) and/or eschar (tan, brown or black) in the wound bed. (Active)  02/26/24 0916  Location: Coccyx  Location Orientation: Mid  Staging: Unstageable - Full thickness tissue loss in which the base of the injury is covered by slough (yellow, tan, gray, green or brown) and/or eschar (tan, brown or black) in the wound bed.  Wound Description (Comments):   Present on Admission: No  Dressing Type Foam - Lift dressing to assess site every shift;Gauze (Comment) 03/15/24 1432     Pressure Injury  02/26/24 Toe (Comment  which one) Anterior;Right Deep Tissue Pressure Injury - Purple or maroon localized area of discolored intact skin or blood-filled blister due to damage of underlying soft tissue from pressure and/or shear. right gre (Active)  02/26/24 1215  Location: Toe (Comment  which one)  Location Orientation: Anterior;Right  Staging: Deep Tissue Pressure Injury - Purple or maroon localized area of discolored intact skin or blood-filled blister due to damage of underlying soft tissue from pressure and/or shear.  Wound Description (Comments): right great toe  Present on Admission:   Dressing Type Foam - Lift dressing to assess site every shift 03/15/24 1432     Pressure Injury 03/06/24 Hip Anterior;Left;Proximal Deep Tissue Pressure Injury - Purple or maroon localized area of discolored intact skin or blood-filled blister due to damage of underlying soft tissue from pressure and/or shear. Fluid filled blister w (Active)  03/06/24 1612  Location: Hip  Location Orientation: Anterior;Left;Proximal  Staging: Deep Tissue Pressure Injury - Purple or maroon localized area of discolored intact skin or blood-filled blister due to damage of underlying soft tissue from pressure and/or shear.  Wound Description (Comments): Fluid filled blister with purple discoloration  Present on Admission: No  Dressing Type Foam - Lift dressing to assess site every shift 03/15/24 1432     Pressure Injury 03/06/24 Shoulder Left Deep Tissue Pressure Injury - Purple or maroon localized area of discolored intact skin or blood-filled blister due to damage of underlying soft tissue from pressure and/or shear. dark purple discoloration to should (Active)  03/06/24 1613  Location: Shoulder  Location Orientation: Left  Staging: Deep Tissue Pressure Injury - Purple or maroon localized area of discolored intact skin or blood-filled blister due to damage of underlying soft tissue from  pressure and/or shear.  Wound  Description (Comments): dark purple discoloration to shoulder  Present on Admission: No  Dressing Type Foam - Lift dressing to assess site every shift;Gauze (Comment) 03/15/24 1432    DVT prophylaxis:SCDs Start: 03/04/24 2151 Place TED hose Start: 03/04/24 2151 apixaban  (ELIQUIS ) tablet 5 mg     Code Status: Limited: Do not attempt resuscitation (DNR) -DNR-LIMITED -Do Not Intubate/DNI   Family Communication: Daughter at bedside on 4/26  Patient status:Inpatient  Patient is from :home  Anticipated discharge to:SNF   Estimated DC date:when placement is found   Consultants:Cardiology,pccm ,surgery  Procedures:None yet  Antimicrobials:  Anti-infectives (From admission, onward)    Start     Dose/Rate Route Frequency Ordered Stop   03/16/24 1015  cefTRIAXone (ROCEPHIN) 2 g in sodium chloride  0.9 % 100 mL IVPB        2 g 200 mL/hr over 30 Minutes Intravenous Every 24 hours 03/16/24 0929 03/21/24 1014   03/16/24 1015  doxycycline (VIBRA-TABS) tablet 100 mg        100 mg Oral Every 12 hours 03/16/24 0929 03/21/24 0959   03/14/24 1000  metroNIDAZOLE  (FLAGYL ) tablet 500 mg        500 mg Oral Every 12 hours 03/14/24 0803 03/20/24 0959   03/13/24 1000  cefadroxil  (DURICEF) capsule 1,000 mg  Status:  Discontinued        1,000 mg Oral 2 times daily 03/12/24 1632 03/16/24 0929   03/07/24 2200  metroNIDAZOLE  (FLAGYL ) tablet 500 mg        500 mg Oral Every 12 hours 03/07/24 0840 03/11/24 2258   03/05/24 1930  vancomycin  (VANCOREADY) IVPB 1250 mg/250 mL  Status:  Discontinued        1,250 mg 166.7 mL/hr over 90 Minutes Intravenous Every 24 hours 03/04/24 2205 03/04/24 2224   03/05/24 1930  Vancomycin  (VANCOCIN ) 1,250 mg in sodium chloride  0.9 % 250 mL IVPB  Status:  Discontinued        1,250 mg 166.7 mL/hr over 90 Minutes Intravenous Every 24 hours 03/04/24 2224 03/05/24 0717   03/05/24 1930  vancomycin  (VANCOREADY) IVPB 1500 mg/300 mL  Status:  Discontinued        1,500 mg 150 mL/hr  over 120 Minutes Intravenous Every 24 hours 03/05/24 0717 03/12/24 1636   03/05/24 1400  ceFEPIme  (MAXIPIME ) 2 g in sodium chloride  0.9 % 100 mL IVPB  Status:  Discontinued        2 g 200 mL/hr over 30 Minutes Intravenous Every 8 hours 03/05/24 0717 03/12/24 1632   03/05/24 0600  ceFEPIme  (MAXIPIME ) 2 g in sodium chloride  0.9 % 100 mL IVPB  Status:  Discontinued        2 g 200 mL/hr over 30 Minutes Intravenous Every 12 hours 03/04/24 2205 03/05/24 0717   03/05/24 0000  metroNIDAZOLE  (FLAGYL ) IVPB 500 mg  Status:  Discontinued        500 mg 100 mL/hr over 60 Minutes Intravenous Every 12 hours 03/04/24 2206 03/04/24 2209   03/04/24 1730  aztreonam (AZACTAM) 2 g in sodium chloride  0.9 % 100 mL IVPB  Status:  Discontinued        2 g 200 mL/hr over 30 Minutes Intravenous  Once 03/04/24 1715 03/04/24 1724   03/04/24 1730  metroNIDAZOLE  (FLAGYL ) IVPB 500 mg  Status:  Discontinued        500 mg 100 mL/hr over 60 Minutes Intravenous Every 12 hours 03/04/24 1715 03/07/24 0840   03/04/24 1730  vancomycin  (VANCOREADY)  IVPB 1500 mg/300 mL        1,500 mg 150 mL/hr over 120 Minutes Intravenous  Once 03/04/24 1715 03/04/24 2153   03/04/24 1730  ceFEPIme  (MAXIPIME ) 2 g in sodium chloride  0.9 % 100 mL IVPB        2 g 200 mL/hr over 30 Minutes Intravenous  Once 03/04/24 1724 03/04/24 1826       Subjective: Patient seen and examined at the bedside today.  He was lying on bed.  Appears weak and tired but overall comfortable, hemodynamically stable.  Not in any acute distress.  On room air.  Not coughing or short of breath during my evaluation.  Complains of pain on his  sacral ulcer.daughter at bedside.  Objective: Vitals:   03/15/24 2100 03/16/24 0036 03/16/24 0621 03/16/24 0840  BP: (!) 114/49 111/69 131/69 128/63  Pulse: 91 (!) 103 (!) 102 92  Resp:  20 18 17   Temp:  98.1 F (36.7 C) 98.6 F (37 C) 97.9 F (36.6 C)  TempSrc:  Oral Oral Oral  SpO2: 96% 95% 98% 98%  Weight:      Height:         Intake/Output Summary (Last 24 hours) at 03/16/2024 0954 Last data filed at 03/16/2024 9604 Gross per 24 hour  Intake --  Output 2800 ml  Net -2800 ml    Filed Weights   03/04/24 1656 03/15/24 0500  Weight: 68.5 kg 75.3 kg    Examination:  General exam: Not in distress, chronically ill looking, deconditioned, weak HEENT: PERRL Respiratory system:  no wheezes or crackles , diminished sounds in bases Cardiovascular system: S1 & S2 heard, RRR.  Gastrointestinal system: Abdomen is nondistended, soft and nontender. Central nervous system: Alert and oriented Extremities: No significant edema, no clubbing ,no cyanosis Skin: Scattered pressure ulcers, sacral pressure ulcer GU: Foley   Data Reviewed: I have personally reviewed following labs and imaging studies  CBC: Recent Labs  Lab 03/10/24 0555 03/11/24 0657 03/12/24 0652 03/14/24 0708 03/16/24 0827  WBC 12.2* 13.8* 13.0* 11.9* 11.5*  HGB 8.7* 8.6* 8.4* 8.8* 8.9*  HCT 25.0* 24.8* 24.7* 26.0* 26.1*  MCV 83.6 83.8 84.9 85.0 85.3  PLT 265 319 342 410* 497*   Basic Metabolic Panel: Recent Labs  Lab 03/10/24 0555 03/11/24 0657 03/12/24 0652 03/14/24 0708 03/16/24 0827  NA 134*  --  135  --  137  K 4.0  --  4.1  --  4.2  CL 105  --  108  --  105  CO2 22  --  23  --  25  GLUCOSE 149*  --  157*  --  191*  BUN 16  --  17  --  20  CREATININE 0.80  --  0.79  --  0.77  CALCIUM  7.4*  --  7.5*  --  8.0*  PHOS 2.0* 3.0 2.9 2.9 3.0     No results found for this or any previous visit (from the past 240 hours).    Radiology Studies: DG CHEST PORT 1 VIEW Result Date: 03/15/2024 CLINICAL DATA:  Cough EXAM: PORTABLE CHEST 1 VIEW COMPARISON:  03/04/2024 FINDINGS: Single frontal view of the chest demonstrates an enlarged cardiac silhouette. Veiling opacity at the left lung base consistent with pleural effusion. Increased density in the retrocardiac region consistent with left lower lobe consolidation. Mild pulmonary vascular  congestion. No pneumothorax. No acute bony abnormalities. IMPRESSION: 1. Left lower lobe consolidation and left pleural effusion. 2. Pulmonary vascular congestion. 3. Enlarged cardiac  silhouette. Electronically Signed   By: Bobbye Burrow M.D.   On: 03/15/2024 20:50      Scheduled Meds:  acetaminophen   500 mg Oral TID   [START ON 03/17/2024] amiodarone   200 mg Oral Daily   apixaban   5 mg Oral BID   ascorbic acid   500 mg Oral BID   atorvastatin   40 mg Oral Daily   Chlorhexidine  Gluconate Cloth  6 each Topical Daily   collagenase    Topical Daily   doxycycline  100 mg Oral Q12H   feeding supplement  237 mL Oral TID BM   ferrous sulfate   325 mg Oral QHS   furosemide   20 mg Oral Daily   guaiFENesin   600 mg Oral BID   insulin  aspart  0-15 Units Subcutaneous TID WC   insulin  aspart  0-5 Units Subcutaneous QHS   insulin  aspart  3 Units Subcutaneous TID WC   insulin  glargine-yfgn  15 Units Subcutaneous Daily   levothyroxine   50 mcg Oral Q0600   liver oil-zinc  oxide   Topical BID   metroNIDAZOLE   500 mg Oral Q12H   midodrine   5 mg Oral TID WC   nutrition supplement (JUVEN)  1 packet Oral BID BM   oxyCODONE   10 mg Oral Q12H   phosphorus  500 mg Oral TID   polyethylene glycol  17 g Oral BID   senna-docusate  1 tablet Oral BID   sodium chloride  flush  3 mL Intravenous Q12H   Tafamidis   1 capsule Oral Daily   zinc  sulfate (50mg  elemental zinc )  220 mg Oral Daily   Continuous Infusions:  cefTRIAXone (ROCEPHIN)  IV        LOS: 12 days   Leona Rake, MD [facility] P4/26/2025, 9:54 AM

## 2024-03-16 NOTE — Plan of Care (Signed)
  Problem: Fluid Volume: Goal: Hemodynamic stability will improve Outcome: Progressing   Problem: Clinical Measurements: Goal: Diagnostic test results will improve Outcome: Progressing Goal: Signs and symptoms of infection will decrease Outcome: Progressing   Problem: Respiratory: Goal: Ability to maintain adequate ventilation will improve Outcome: Progressing   Problem: Education: Goal: Knowledge of risk factors and measures for prevention of condition will improve Outcome: Progressing   Problem: Coping: Goal: Psychosocial and spiritual needs will be supported Outcome: Progressing   Problem: Respiratory: Goal: Will maintain a patent airway Outcome: Progressing Goal: Complications related to the disease process, condition or treatment will be avoided or minimized Outcome: Progressing   Problem: Education: Goal: Ability to describe self-care measures that may prevent or decrease complications (Diabetes Survival Skills Education) will improve Outcome: Progressing Goal: Individualized Educational Video(s) Outcome: Progressing   Problem: Coping: Goal: Ability to adjust to condition or change in health will improve Outcome: Progressing   Problem: Fluid Volume: Goal: Ability to maintain a balanced intake and output will improve Outcome: Progressing   Problem: Health Behavior/Discharge Planning: Goal: Ability to identify and utilize available resources and services will improve Outcome: Progressing Goal: Ability to manage health-related needs will improve Outcome: Progressing   Problem: Metabolic: Goal: Ability to maintain appropriate glucose levels will improve Outcome: Progressing   Problem: Nutritional: Goal: Maintenance of adequate nutrition will improve Outcome: Progressing Goal: Progress toward achieving an optimal weight will improve Outcome: Progressing   Problem: Skin Integrity: Goal: Risk for impaired skin integrity will decrease Outcome: Progressing    Problem: Tissue Perfusion: Goal: Adequacy of tissue perfusion will improve Outcome: Progressing   Problem: Education: Goal: Knowledge of General Education information will improve Description: Including pain rating scale, medication(s)/side effects and non-pharmacologic comfort measures Outcome: Progressing   Problem: Health Behavior/Discharge Planning: Goal: Ability to manage health-related needs will improve Outcome: Progressing   Problem: Clinical Measurements: Goal: Ability to maintain clinical measurements within normal limits will improve Outcome: Progressing Goal: Will remain free from infection Outcome: Progressing Goal: Diagnostic test results will improve Outcome: Progressing Goal: Respiratory complications will improve Outcome: Progressing Goal: Cardiovascular complication will be avoided Outcome: Progressing   Problem: Activity: Goal: Risk for activity intolerance will decrease Outcome: Progressing   Problem: Nutrition: Goal: Adequate nutrition will be maintained Outcome: Progressing   Problem: Coping: Goal: Level of anxiety will decrease Outcome: Progressing   Problem: Elimination: Goal: Will not experience complications related to bowel motility Outcome: Progressing Goal: Will not experience complications related to urinary retention Outcome: Progressing   Problem: Pain Managment: Goal: General experience of comfort will improve and/or be controlled Outcome: Progressing   Problem: Safety: Goal: Ability to remain free from injury will improve Outcome: Progressing   Problem: Skin Integrity: Goal: Risk for impaired skin integrity will decrease Outcome: Progressing

## 2024-03-17 DIAGNOSIS — E785 Hyperlipidemia, unspecified: Secondary | ICD-10-CM | POA: Diagnosis not present

## 2024-03-17 DIAGNOSIS — A419 Sepsis, unspecified organism: Secondary | ICD-10-CM | POA: Diagnosis not present

## 2024-03-17 DIAGNOSIS — I10 Essential (primary) hypertension: Secondary | ICD-10-CM | POA: Diagnosis not present

## 2024-03-17 DIAGNOSIS — J189 Pneumonia, unspecified organism: Secondary | ICD-10-CM

## 2024-03-17 DIAGNOSIS — I4891 Unspecified atrial fibrillation: Secondary | ICD-10-CM | POA: Diagnosis not present

## 2024-03-17 LAB — GLUCOSE, CAPILLARY
Glucose-Capillary: 183 mg/dL — ABNORMAL HIGH (ref 70–99)
Glucose-Capillary: 259 mg/dL — ABNORMAL HIGH (ref 70–99)
Glucose-Capillary: 137 mg/dL — ABNORMAL HIGH (ref 70–99)
Glucose-Capillary: 62 mg/dL — ABNORMAL LOW (ref 70–99)
Glucose-Capillary: 103 mg/dL — ABNORMAL HIGH (ref 70–99)

## 2024-03-17 NOTE — Plan of Care (Signed)
  Problem: Fluid Volume: Goal: Hemodynamic stability will improve Outcome: Progressing   Problem: Clinical Measurements: Goal: Diagnostic test results will improve Outcome: Progressing Goal: Signs and symptoms of infection will decrease Outcome: Progressing   Problem: Respiratory: Goal: Ability to maintain adequate ventilation will improve Outcome: Progressing   Problem: Education: Goal: Knowledge of risk factors and measures for prevention of condition will improve Outcome: Progressing   Problem: Coping: Goal: Psychosocial and spiritual needs will be supported Outcome: Progressing   Problem: Respiratory: Goal: Will maintain a patent airway Outcome: Progressing Goal: Complications related to the disease process, condition or treatment will be avoided or minimized Outcome: Progressing   Problem: Education: Goal: Ability to describe self-care measures that may prevent or decrease complications (Diabetes Survival Skills Education) will improve Outcome: Progressing Goal: Individualized Educational Video(s) Outcome: Progressing   Problem: Coping: Goal: Ability to adjust to condition or change in health will improve Outcome: Progressing   Problem: Fluid Volume: Goal: Ability to maintain a balanced intake and output will improve Outcome: Progressing   Problem: Health Behavior/Discharge Planning: Goal: Ability to identify and utilize available resources and services will improve Outcome: Progressing Goal: Ability to manage health-related needs will improve Outcome: Progressing   Problem: Metabolic: Goal: Ability to maintain appropriate glucose levels will improve Outcome: Progressing   Problem: Nutritional: Goal: Maintenance of adequate nutrition will improve Outcome: Progressing Goal: Progress toward achieving an optimal weight will improve Outcome: Progressing   Problem: Skin Integrity: Goal: Risk for impaired skin integrity will decrease Outcome: Progressing    Problem: Tissue Perfusion: Goal: Adequacy of tissue perfusion will improve Outcome: Progressing   Problem: Education: Goal: Knowledge of General Education information will improve Description: Including pain rating scale, medication(s)/side effects and non-pharmacologic comfort measures Outcome: Progressing   Problem: Health Behavior/Discharge Planning: Goal: Ability to manage health-related needs will improve Outcome: Progressing   Problem: Clinical Measurements: Goal: Ability to maintain clinical measurements within normal limits will improve Outcome: Progressing Goal: Will remain free from infection Outcome: Progressing Goal: Diagnostic test results will improve Outcome: Progressing Goal: Respiratory complications will improve Outcome: Progressing Goal: Cardiovascular complication will be avoided Outcome: Progressing   Problem: Activity: Goal: Risk for activity intolerance will decrease Outcome: Progressing   Problem: Nutrition: Goal: Adequate nutrition will be maintained Outcome: Progressing   Problem: Coping: Goal: Level of anxiety will decrease Outcome: Progressing   Problem: Elimination: Goal: Will not experience complications related to bowel motility Outcome: Progressing Goal: Will not experience complications related to urinary retention Outcome: Progressing   Problem: Pain Managment: Goal: General experience of comfort will improve and/or be controlled Outcome: Progressing   Problem: Safety: Goal: Ability to remain free from injury will improve Outcome: Progressing   Problem: Skin Integrity: Goal: Risk for impaired skin integrity will decrease Outcome: Progressing

## 2024-03-17 NOTE — Progress Notes (Signed)
 PROGRESS NOTE    Earl [name]  ZOX:[id number] DOB: Aug 13, 1941 DOA: 03/04/2024 PCP: [name], MD    No chief complaint on file.   Brief Narrative:  Patient is a 83 year old male with history of paroxysmal A-fib, insulin -dependent diabetes, hypertension, hyperlipidemia, CVA with residual right-sided weakness, MGUS, GERD, BPH, hypothyroidism, HFrEF with EF of 40 to 45%, chronic pancytopenia who presented to the emergency department from SNF with complaint of shortness of breath, wound, pain in the sacral area.  He was just discharged from here to [facility] rehab 4 days ago.  Hospital course was remarkable for acute metabolic encephalopathy, hypotension, hypoglycemia.  On presentation, he was hypotensive, EKG showed A-fib with RVR, tachypnea but saturating fine on room air.  Respiratory panel negative for COVID.  PCCM consulted for persistent hypotension.  Cardiology also consulted.  General surgery consulted for sacral ulcer.  Started on broad-spectrum antibiotics, amiodarone  drip.  Status post bedside excisional debridement of the sacral wound on 4/15 and 4/17.  Wound care, cardiology, general surgery following.  No plan for further operative intervention for sacral ulcer.  Now hemodynamically stable.Prolonged hospitalization.  Plan for SNF    Assessment & Plan:   Principal Problem:   Severe sepsis (HCC) Active Problems:   Paroxysmal atrial fibrillation (HCC)   Sepsis (HCC)   Atrial flutter (HCC)   Sacral ulcer (HCC)   MGUS (monoclonal gammopathy of unknown significance)   Essential hypertension   Hyperlipidemia   Insulin  dependent type 2 diabetes mellitus (HCC)   History of CVA (cerebrovascular accident)   Cardiac amyloidosis (HCC)   Chronic systolic CHF (congestive heart failure) (HCC)   GERD (gastroesophageal reflux disease)   History of hypertension   Atrial fibrillation with RVR (HCC)   Community acquired pneumonia  #1 sepsis secondary to sacral pressure  wound/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 - Patient noted to have an elevated lactic acid level, noted to have a leukocytosis, with a metabolic encephalopathy and noted to be hypotensive. - Patient pancultured with cultures with no growth to date. - Patient placed on IV fluids as well as empiric IV antibiotics. - Patient seen by PCCM and continued on midodrine . - BP improved on midodrine  5 mg 3 times daily which we will continue. - Continue current empiric antibiotics of Flagyl , IV Rocephin. - Continue current wound care management. - Follow.  2.  Chronic sacral wound with suspicion for infection -CT chest abdomen and pelvis done showed a sacral decubitus wound with extension to the right gluteus muscle with no definite bony lesions or signs of osteomyelitis.  No significant abscesses noted. - Patient was seen in consultation by general surgery and patient underwent bedside excisional debridement of the sacral wound on 4/15 and 4/17. - General Surgery recommended to continue twice daily wet-to-dry dressing changes with Dakin's. - No further surgical debridement planned per general surgery. - Wound care RN following. - Will likely need ongoing wound care on discharge and may need to follow-up in the outpatient wound care center. - Patient was seen in consultation by ID who had recommended an additional 7 days of antibiotics of cefadroxil  and Flagyl  through 4/30 with continuation of current wound care and nutritional support.  3.  History of paroxysmal atrial fibrillation -Noted to have presented A-fib with RVR. - Patient's Toprol  was discontinued during last hospitalization due to blood pressure issues and patient initially started on  amiodarone  drip. - Patient seen in consultation by cardiology, digoxin  discontinued and patient  transition to oral amiodarone . - Rate currently controlled as patient noted to be in and out of A-fib. - Eliquis  for anticoagulation.  4.  History of HFrEF/transthyretin cardiac amyloidosis/NICM/hypotension -Patient noted to have presented with hypotension, with concerns for sepsis. - Patient noted to have been on Entresto  and Toprol  at home which are currently on hold. - Patient initially received some IV fluid resuscitation. - Currently on midodrine . -Patient started back on low-dose oral Lasix . - Patient was seen in consultation by cardiology during the hospitalization. - Outpatient follow-up with cardiology.  5.  Suspected left lower lobe pneumonia -Patient noted to have had a productive cough on 03/15/2024. - Chest x-ray done concerning for lower lobe consolidation however patient noted to be on room air and not in any acute respiratory distress. - Patient afebrile. - Leukocytosis noted to have trended down. - Patient noted to be on antibiotics secondary to problem #1 and 2. - Currently on IV Rocephin and doxycycline to complete a 5-day course of treatment. - May be transition to oral antibiotics when ready for discharge.  6.  Insulin -dependent diabetes mellitus type 2 -Hemoglobin A1c 8.4 (02/22/2024) - Patient noted with poor oral intake recently per daughter. - CBG noted at 183 this morning. - Daughter requesting diet be changed to a pured diet. - Continue NovoLog  meal coverage 3 units 3 times daily with meals, Semglee  15 units daily, SSI.  7.  GERD -PPI.  8.  History of CVA with residual right-sided weakness -Continue Lipitor, Eliquis .  9.  Anemia of chronic disease -Follow H&H. -Transfusion threshold hemoglobin < 8. -Continue oral iron supplementation.  10.  Hypothyroidism -Synthroid .  11 recent history of COVID -Patient noted to be COVID-positive 02/14/2024. -Still positive on admission. - Currently on room air. - No need for isolation.  12.  Severe protein  calorie malnutrition/suspicion for dysphagia/hypophosphatemia -Patient was seen by speech therapy and diet liberalized to a regular diet. - Patient also on oral phosphorus supplementation. - Per daughter patient with difficulty chewing his food and requesting a pured diet. - Patient placed on the pured diet for now could potentially be able to tolerate a mechanical soft diet however we will see how patient does on pured diet for the next 24 hours. - Dietitian following.  13.  Constipation -Continue current bowel regimen of MiraLAX  twice daily, Senokot-S twice daily.  14.  Goals of care -Patient deconditioned elderly male currently bedbound since last recent hospitalization with continued debility and weakness.  Patient prior to recent hospitalization patient noted to have been living home with a caregiver 4 days after week, using a walker to aid with ambulation with good oral intake. - Patient noted with multiple comorbidities, now with a sacral pressure ulcer and admitted for sepsis.  Currently CODE STATUS is a DNR. - Palliative care consulted and following. - PT OT consulted recommended SNF. - TOC consulted for placement.  15.  Pressure injury Pressure Injury 02/26/24 Coccyx Mid Unstageable - Full thickness tissue loss in which the base of the injury is covered by slough (yellow, tan, gray, green or brown) and/or eschar (tan, brown or black) in the wound bed. (Active)  02/26/24 0916  Location: Coccyx  Location Orientation: Mid  Staging: Unstageable - Full thickness tissue loss in which the base of the injury is covered by slough (yellow, tan, gray, green or brown) and/or eschar (tan, brown or black) in the wound bed.  Wound Description (  Comments):   Present on Admission: No     Pressure Injury 02/26/24 Toe (Comment  which one) Anterior;Right Deep Tissue Pressure Injury - Purple or maroon localized area of discolored intact skin or blood-filled blister due to damage of underlying soft  tissue from pressure and/or shear. right gre (Active)  02/26/24 1215  Location: Toe (Comment  which one)  Location Orientation: Anterior;Right  Staging: Deep Tissue Pressure Injury - Purple or maroon localized area of discolored intact skin or blood-filled blister due to damage of underlying soft tissue from pressure and/or shear.  Wound Description (Comments): right great toe  Present on Admission:      Pressure Injury 03/06/24 Hip Anterior;Left;Proximal Deep Tissue Pressure Injury - Purple or maroon localized area of discolored intact skin or blood-filled blister due to damage of underlying soft tissue from pressure and/or shear. Fluid filled blister w (Active)  03/06/24 1612  Location: Hip  Location Orientation: Anterior;Left;Proximal  Staging: Deep Tissue Pressure Injury - Purple or maroon localized area of discolored intact skin or blood-filled blister due to damage of underlying soft tissue from pressure and/or shear.  Wound Description (Comments): Fluid filled blister with purple discoloration  Present on Admission: No     Pressure Injury 03/06/24 Shoulder Left Deep Tissue Pressure Injury - Purple or maroon localized area of discolored intact skin or blood-filled blister due to damage of underlying soft tissue from pressure and/or shear. dark purple discoloration to should (Active)  03/06/24 1613  Location: Shoulder  Location Orientation: Left  Staging: Deep Tissue Pressure Injury - Purple or maroon localized area of discolored intact skin or blood-filled blister due to damage of underlying soft tissue from pressure and/or shear.  Wound Description (Comments): dark purple discoloration to shoulder  Present on Admission: No       DVT prophylaxis: Eliquis  Code Status: DNR Family Communication: Updated patient and daughter at bedside. Disposition: SNF  Status is: Inpatient Remains inpatient appropriate because: Severity of illness   Consultants:  General Surgery: Dr. Ramiro Burly  03/04/2024 Cardiology: Dr. Hendrick Locke 03/04/2024 PCCM: Dr. Charon Copper 03/05/2024 Wound care RN, Ronni Colace, RN 03/05/2024 Palliative care: Dr. Alejos Husband 03/10/2024 ID: Dr. Artemio Larry 03/11/2024   Procedures:  CT chest abdomen and pelvis 03/04/2024 Chest x-ray 03/04/2024, 03/15/2024 Excisional debridement per general surgery: Dr. Alethea Andes III 03/05/2024  Antimicrobials:  Anti-infectives (From admission, onward)    Start     Dose/Rate Route Frequency Ordered Stop   03/16/24 1015  cefTRIAXone (ROCEPHIN) 2 g in sodium chloride  0.9 % 100 mL IVPB        2 g 200 mL/hr over 30 Minutes Intravenous Every 24 hours 03/16/24 0929 03/21/24 1014   03/16/24 1015  doxycycline (VIBRA-TABS) tablet 100 mg        100 mg Oral Every 12 hours 03/16/24 0929 03/21/24 0959   03/14/24 1000  metroNIDAZOLE  (FLAGYL ) tablet 500 mg        500 mg Oral Every 12 hours 03/14/24 0803 03/20/24 0959   03/13/24 1000  cefadroxil  (DURICEF) capsule 1,000 mg  Status:  Discontinued        1,000 mg Oral 2 times daily 03/12/24 1632 03/16/24 0929   03/07/24 2200  metroNIDAZOLE  (FLAGYL ) tablet 500 mg        500 mg Oral Every 12 hours 03/07/24 0840 03/11/24 2258   03/05/24 1930  vancomycin  (VANCOREADY) IVPB 1250 mg/250 mL  Status:  Discontinued        1,250 mg 166.7 mL/hr over 90 Minutes Intravenous Every 24 hours 03/04/24 2205 03/04/24 2224  03/05/24 1930  Vancomycin  (VANCOCIN ) 1,250 mg in sodium chloride  0.9 % 250 mL IVPB  Status:  Discontinued        1,250 mg 166.7 mL/hr over 90 Minutes Intravenous Every 24 hours 03/04/24 2224 03/05/24 0717   03/05/24 1930  vancomycin  (VANCOREADY) IVPB 1500 mg/300 mL  Status:  Discontinued        1,500 mg 150 mL/hr over 120 Minutes Intravenous Every 24 hours 03/05/24 0717 03/12/24 1636   03/05/24 1400  ceFEPIme  (MAXIPIME ) 2 g in sodium chloride  0.9 % 100 mL IVPB  Status:  Discontinued        2 g 200 mL/hr over 30 Minutes Intravenous Every 8 hours 03/05/24 0717 03/12/24 1632   03/05/24 0600  ceFEPIme  (MAXIPIME )  2 g in sodium chloride  0.9 % 100 mL IVPB  Status:  Discontinued        2 g 200 mL/hr over 30 Minutes Intravenous Every 12 hours 03/04/24 2205 03/05/24 0717   03/05/24 0000  metroNIDAZOLE  (FLAGYL ) IVPB 500 mg  Status:  Discontinued        500 mg 100 mL/hr over 60 Minutes Intravenous Every 12 hours 03/04/24 2206 03/04/24 2209   03/04/24 1730  aztreonam (AZACTAM) 2 g in sodium chloride  0.9 % 100 mL IVPB  Status:  Discontinued        2 g 200 mL/hr over 30 Minutes Intravenous  Once 03/04/24 1715 03/04/24 1724   03/04/24 1730  metroNIDAZOLE  (FLAGYL ) IVPB 500 mg  Status:  Discontinued        500 mg 100 mL/hr over 60 Minutes Intravenous Every 12 hours 03/04/24 1715 03/07/24 0840   03/04/24 1730  vancomycin  (VANCOREADY) IVPB 1500 mg/300 mL        1,500 mg 150 mL/hr over 120 Minutes Intravenous  Once 03/04/24 1715 03/04/24 2153   03/04/24 1730  ceFEPIme  (MAXIPIME ) 2 g in sodium chloride  0.9 % 100 mL IVPB        2 g 200 mL/hr over 30 Minutes Intravenous  Once 03/04/24 1724 03/04/24 1826         Subjective: Patient lying in bed.  States he feels a little bit better.  Denies any significant chest pain.  Denies any significant shortness of breath.  Still with a productive cough.  Daughter at bedside stating patient with significant weakness with inability to feed himself.  States takes a while for patient to swallow his meals and asking for diet to be changed to a pured diet.  Objective: Vitals:   03/17/24 0431 03/17/24 0822 03/17/24 1252 03/17/24 1547  BP: 115/66 (!) 117/59 (!) 117/54 109/67  Pulse: 96 96 95 87  Resp: 16 16 16 16   Temp: 98.2 F (36.8 C) 98.4 F (36.9 C) 98 F (36.7 C) 97.7 F (36.5 C)  TempSrc: Oral Oral  Oral  SpO2: 96% 95% 97% 99%  Weight:      Height:        Intake/Output Summary (Last 24 hours) at 03/17/2024 1915 Last data filed at 03/17/2024 1734 Gross per 24 hour  Intake 441 ml  Output 850 ml  Net -409 ml   Filed Weights   03/04/24 1656 03/15/24 0500   Weight: 68.5 kg 75.3 kg    Examination:  General exam: Appears calm and comfortable.  Frail. Respiratory system: Clear to auscultation anterior lung fields.  No wheezes, no crackles, no rhonchi.  Respiratory effort normal. Cardiovascular system: S1 & S2 heard, RRR. No JVD, murmurs, rubs, gallops or clicks. No pedal edema. Gastrointestinal  system: Abdomen is nondistended, soft and nontender. No organomegaly or masses felt. Normal bowel sounds heard. Central nervous system: Alert and oriented. No focal neurological deficits. Extremities: Symmetric 5 x 5 power. Skin: No rashes, lesions or ulcers Psychiatry: Judgement and insight appear normal. Mood & affect appropriate.     Data Reviewed: I have personally reviewed following labs and imaging studies  CBC: Recent Labs  Lab 03/11/24 0657 03/12/24 0652 03/14/24 0708 03/16/24 0827  WBC 13.8* 13.0* 11.9* 11.5*  HGB 8.6* 8.4* 8.8* 8.9*  HCT 24.8* 24.7* 26.0* 26.1*  MCV 83.8 84.9 85.0 85.3  PLT 319 342 410* 497*    Basic Metabolic Panel: Recent Labs  Lab 03/11/24 0657 03/12/24 0652 03/14/24 0708 03/16/24 0827  NA  --  135  --  137  K  --  4.1  --  4.2  CL  --  108  --  105  CO2  --  23  --  25  GLUCOSE  --  157*  --  191*  BUN  --  17  --  20  CREATININE  --  0.79  --  0.77  CALCIUM   --  7.5*  --  8.0*  PHOS 3.0 2.9 2.9 3.0    GFR: Estimated Creatinine Clearance: 75.8 mL/min (by C-G formula based on SCr of 0.77 mg/dL).  Liver Function Tests: No results for input(s): "AST", "ALT", "ALKPHOS", "BILITOT", "PROT", "ALBUMIN " in the last 168 hours.  CBG: Recent Labs  Lab 03/16/24 1743 03/16/24 2051 03/17/24 0817 03/17/24 1238 03/17/24 1544  GLUCAP 138* 146* 183* 259* 137*     No results found for this or any previous visit (from the past 240 hours).       Radiology Studies: No results found.       Scheduled Meds:  acetaminophen   500 mg Oral TID   amiodarone   200 mg Oral Daily   apixaban   5 mg Oral  BID   ascorbic acid   500 mg Oral BID   atorvastatin   40 mg Oral Daily   Chlorhexidine  Gluconate Cloth  6 each Topical Daily   collagenase    Topical Daily   doxycycline  100 mg Oral Q12H   feeding supplement  237 mL Oral TID BM   ferrous sulfate   325 mg Oral QHS   furosemide   20 mg Oral Daily   guaiFENesin   600 mg Oral BID   insulin  aspart  0-15 Units Subcutaneous TID WC   insulin  aspart  0-5 Units Subcutaneous QHS   insulin  aspart  3 Units Subcutaneous TID WC   insulin  glargine-yfgn  15 Units Subcutaneous Daily   levothyroxine   50 mcg Oral Q0600   liver oil-zinc  oxide   Topical BID   metroNIDAZOLE   500 mg Oral Q12H   midodrine   5 mg Oral TID WC   nutrition supplement (JUVEN)  1 packet Oral BID BM   oxyCODONE   10 mg Oral Q12H   phosphorus  500 mg Oral TID   polyethylene glycol  17 g Oral BID   senna-docusate  1 tablet Oral BID   sodium chloride  flush  3 mL Intravenous Q12H   Tafamidis   1 capsule Oral Daily   zinc  sulfate (50mg  elemental zinc )  220 mg Oral Daily   Continuous Infusions:  cefTRIAXone (ROCEPHIN)  IV Stopped (03/17/24 1130)     LOS: 13 days    Time spent: 40 minutes    Hilda Lovings, MD [facility]   To contact the attending provider between 7A-7P or the  covering provider during after hours 7P-7A, please log into the web site [email or web address] and access using universal [place] password for that web site. If you do not have the password, please call the hospital operator.  03/17/2024, 7:15 PM

## 2024-03-18 DIAGNOSIS — Z515 Encounter for palliative care: Secondary | ICD-10-CM | POA: Diagnosis not present

## 2024-03-18 DIAGNOSIS — Z7189 Other specified counseling: Secondary | ICD-10-CM | POA: Diagnosis not present

## 2024-03-18 DIAGNOSIS — A419 Sepsis, unspecified organism: Secondary | ICD-10-CM | POA: Diagnosis not present

## 2024-03-18 DIAGNOSIS — I4891 Unspecified atrial fibrillation: Secondary | ICD-10-CM | POA: Diagnosis not present

## 2024-03-18 DIAGNOSIS — E785 Hyperlipidemia, unspecified: Secondary | ICD-10-CM | POA: Diagnosis not present

## 2024-03-18 DIAGNOSIS — I10 Essential (primary) hypertension: Secondary | ICD-10-CM | POA: Diagnosis not present

## 2024-03-18 LAB — CBC WITH DIFFERENTIAL/PLATELET
Abs Immature Granulocytes: 0.07 10*3/uL (ref 0.00–0.07)
Basophils Absolute: 0.1 10*3/uL (ref 0.0–0.1)
Basophils Relative: 1 %
Eosinophils Absolute: 0.2 10*3/uL (ref 0.0–0.5)
Eosinophils Relative: 2 %
HCT: 24.7 % — ABNORMAL LOW (ref 39.0–52.0)
Hemoglobin: 8.3 g/dL — ABNORMAL LOW (ref 13.0–17.0)
Immature Granulocytes: 1 %
Lymphocytes Relative: 16 %
Lymphs Abs: 1.5 10*3/uL (ref 0.7–4.0)
MCH: 28.9 pg (ref 26.0–34.0)
MCHC: 33.6 g/dL (ref 30.0–36.0)
MCV: 86.1 fL (ref 80.0–100.0)
Monocytes Absolute: 1 10*3/uL (ref 0.1–1.0)
Monocytes Relative: 10 %
Neutro Abs: 6.9 10*3/uL (ref 1.7–7.7)
Neutrophils Relative %: 70 %
Platelets: 513 10*3/uL — ABNORMAL HIGH (ref 150–400)
RBC: 2.87 MIL/uL — ABNORMAL LOW (ref 4.22–5.81)
RDW: 15.9 % — ABNORMAL HIGH (ref 11.5–15.5)
WBC: 9.8 10*3/uL (ref 4.0–10.5)
nRBC: 0 % (ref 0.0–0.2)

## 2024-03-18 LAB — GLUCOSE, CAPILLARY
Glucose-Capillary: 149 mg/dL — ABNORMAL HIGH (ref 70–99)
Glucose-Capillary: 268 mg/dL — ABNORMAL HIGH (ref 70–99)
Glucose-Capillary: 297 mg/dL — ABNORMAL HIGH (ref 70–99)
Glucose-Capillary: 138 mg/dL — ABNORMAL HIGH (ref 70–99)

## 2024-03-18 LAB — RENAL FUNCTION PANEL
Albumin: <1.5 g/dL — ABNORMAL LOW (ref 3.5–5.0)
Anion gap: 9 (ref 5–15)
BUN: 19 mg/dL (ref 8–23)
CO2: 25 mmol/L (ref 22–32)
Calcium: 7.6 mg/dL — ABNORMAL LOW (ref 8.9–10.3)
Chloride: 102 mmol/L (ref 98–111)
Creatinine, Ser: 0.71 mg/dL (ref 0.61–1.24)
GFR, Estimated: >60 mL/min (ref >60)
Glucose, Bld: 120 mg/dL — ABNORMAL HIGH (ref 70–99)
Phosphorus: 2.9 mg/dL (ref 2.5–4.6)
Potassium: 3.7 mmol/L (ref 3.5–5.1)
Sodium: 136 mmol/L (ref 135–145)

## 2024-03-18 LAB — MAGNESIUM: Magnesium: 1.7 mg/dL (ref 1.7–2.4)

## 2024-03-18 MED ORDER — MORPHINE SULFATE (PF) 2 MG/ML IV SOLN
1.0000 mg | INTRAVENOUS | Status: DC | PRN
  Administered 2024-03-18 – 2024-04-14 (×41): 1 mg via INTRAVENOUS
  Filled 2024-03-18 (×41): qty 1

## 2024-03-18 MED ORDER — MAGNESIUM SULFATE 2 GM/50ML IV SOLN
2.0000 g | Freq: Once | INTRAVENOUS | Status: AC
  Administered 2024-03-18: 2 g via INTRAVENOUS
  Filled 2024-03-18: qty 50

## 2024-03-18 MED ORDER — POLYETHYLENE GLYCOL 3350 17 G PO PACK
17.0000 g | PACK | Freq: Every day | ORAL | Status: DC | PRN
  Administered 2024-03-31 – 2024-05-23 (×2): 17 g via ORAL
  Filled 2024-03-18: qty 1

## 2024-03-18 MED ORDER — SENNOSIDES-DOCUSATE SODIUM 8.6-50 MG PO TABS: 1.0000 | ORAL_TABLET | Freq: Every day | ORAL | Status: DC

## 2024-03-18 MED ORDER — OXYCODONE HCL 5 MG PO TABS
2.5000 mg | ORAL_TABLET | ORAL | Status: DC | PRN
  Administered 2024-03-19 – 2024-07-01 (×62): 2.5 mg via ORAL
  Filled 2024-03-18 (×67): qty 1

## 2024-03-18 NOTE — Progress Notes (Signed)
 PROGRESS NOTE    Earl [name]  JYN:[id number] DOB: [birth year] DOA: 03/04/2024 PCP: [name], MD    No chief complaint on file.   Brief Narrative:  Patient is a 83 year old male with history of paroxysmal A-fib, insulin -dependent diabetes, hypertension, hyperlipidemia, CVA with residual right-sided weakness, MGUS, GERD, BPH, hypothyroidism, HFrEF with EF of 40 to 45%, chronic pancytopenia who presented to the emergency department from SNF with complaint of shortness of breath, wound, pain in the sacral area.  He was just discharged from here to [facility] rehab 4 days ago.  Hospital course was remarkable for acute metabolic encephalopathy, hypotension, hypoglycemia.  On presentation, he was hypotensive, EKG showed A-fib with RVR, tachypnea but saturating fine on room air.  Respiratory panel negative for COVID.  PCCM consulted for persistent hypotension.  Cardiology also consulted.  General surgery consulted for sacral ulcer.  Started on broad-spectrum antibiotics, amiodarone  drip.  Status post bedside excisional debridement of the sacral wound on 4/15 and 4/17.  Wound care, cardiology, general surgery following.  No plan for further operative intervention for sacral ulcer.  Now hemodynamically stable.Prolonged hospitalization.  Plan for SNF    Assessment & Plan:   Principal Problem:   Severe sepsis (HCC) Active Problems:   Paroxysmal atrial fibrillation (HCC)   Sepsis (HCC)   Atrial flutter (HCC)   Sacral ulcer (HCC)   MGUS (monoclonal gammopathy of unknown significance)   Essential hypertension   Hyperlipidemia   Insulin  dependent type 2 diabetes mellitus (HCC)   History of CVA (cerebrovascular accident)   Cardiac amyloidosis (HCC)   Chronic systolic CHF (congestive heart failure) (HCC)   GERD (gastroesophageal reflux disease)   History of hypertension   Atrial fibrillation with RVR (HCC)   Community acquired pneumonia  #1 sepsis secondary to sacral pressure  wound/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 - Patient noted to have an elevated lactic acid level, noted to have a leukocytosis, with a metabolic encephalopathy and noted to be hypotensive. - Patient pancultured with cultures with no growth to date. - Patient placed on IV fluids as well as empiric IV antibiotics. - Patient seen by PCCM and continued on midodrine . - BP improved on midodrine  5 mg 3 times daily which we will continue. - Continue current empiric antibiotics of Flagyl , IV Rocephin. - Continue current wound care management. - Follow.  2.  Chronic sacral wound with suspicion for infection -CT chest abdomen and pelvis done showed a sacral decubitus wound with extension to the right gluteus muscle with no definite bony lesions or signs of osteomyelitis.  No significant abscesses noted. - Patient was seen in consultation by general surgery and patient underwent bedside excisional debridement of the sacral wound on 4/15 and 4/17. - General Surgery recommended to continue twice daily wet-to-dry dressing changes with Dakin's. - No further surgical debridement planned per general surgery. - Wound care RN following. - Will likely need ongoing wound care on discharge and may need to follow-up in the outpatient wound care center. - Patient was seen in consultation by ID who had recommended an additional 7 days of antibiotics of cefadroxil  and Flagyl  through 4/30 with continuation of current wound care and nutritional support.  3.  History of paroxysmal atrial fibrillation -Noted to have presented A-fib with RVR. - Patient's Toprol  was discontinued during last hospitalization due to blood pressure issues and patient initially started on  amiodarone  drip. - Patient seen in consultation by cardiology, digoxin  discontinued and patient  transitioned to oral amiodarone . - Rate currently controlled as patient noted to be in and out of A-fib. - Eliquis  for anticoagulation.  4.  History of HFrEF/transthyretin cardiac amyloidosis/NICM/hypotension -Patient noted to have presented with hypotension, with concerns for sepsis. - Patient noted to have been on Entresto  and Toprol  at home which are currently on hold. - Patient initially received some IV fluid resuscitation. - Currently on midodrine . -Patient started back on low-dose oral Lasix . - Patient was seen in consultation by cardiology during the hospitalization. - Outpatient follow-up with cardiology.  5.  Suspected left lower lobe pneumonia -Patient noted to have had a productive cough on 03/15/2024. - Chest x-ray done concerning for lower lobe consolidation however patient noted to be on room air and not in any acute respiratory distress. - Patient afebrile. - Leukocytosis noted to have trended down. - Patient noted to be on antibiotics secondary to problem #1 and 2. - Currently on IV Rocephin and doxycycline to complete a 5-day course of treatment. - May be able to transition to oral antibiotics when ready for discharge.  6.  Insulin -dependent diabetes mellitus type 2 -Hemoglobin A1c 8.4 (02/22/2024) - Patient noted with poor oral intake recently per daughter. - CBG noted at 149 this morning. - Daughter requested diet be changed to a pured diet which patient is currently on.. - Semglee  15 units daily, SSI.  7.  GERD - Continue PPI.  8.  History of CVA with residual right-sided weakness - Lipitor, Eliquis .   9.  Anemia of chronic disease -Follow H&H. -Transfusion threshold hemoglobin < 8. -Continue oral iron supplementation.  10.  Hypothyroidism - Continue Synthroid .    11 recent history of COVID -Patient noted to be COVID-positive 02/14/2024. -Still positive on admission. - Currently on room air. - No need for isolation.  12.  Severe protein calorie  malnutrition/suspicion for dysphagia/hypophosphatemia -Patient was seen by speech therapy and diet liberalized to a regular diet. - Patient also on oral phosphorus supplementation. - Per daughter patient with difficulty chewing his food and requested a pured diet which patient seems to be tolerating. - SLP following.  Dietitian following.  13.  Constipation - Continue MiraLAX  twice daily, Senokot-S twice daily.   14.  Goals of care -Patient deconditioned elderly male currently bedbound since last recent hospitalization with continued debility and weakness.  Patient prior to recent hospitalization patient noted to have been living home with a caregiver 4 days after week, using a walker to aid with ambulation with good oral intake. - Patient noted with multiple comorbidities, now with a sacral pressure ulcer and admitted for sepsis.  Currently CODE STATUS is a DNR. - Palliative care consulted and following. - PT/ OT consulted recommended SNF. - TOC consulted for placement.  15.  Pressure injury Pressure Injury 02/26/24 Coccyx Mid Unstageable - Full thickness tissue loss in which the base of the injury is covered by slough (yellow, tan, gray, green or brown) and/or eschar (tan, brown or black) in the wound bed. (Active)  02/26/24 0916  Location: Coccyx  Location Orientation: Mid  Staging: Unstageable - Full thickness tissue loss in which the base of the injury is covered by slough (yellow, tan, gray, green or brown) and/or eschar (tan, brown or black) in the wound bed.  Wound Description (Comments):   Present on Admission: No     Pressure Injury 02/26/24 Toe (Comment  which one) Anterior;Right Deep Tissue Pressure Injury -  Purple or maroon localized area of discolored intact skin or blood-filled blister due to damage of underlying soft tissue from pressure and/or shear. right gre (Active)  02/26/24 1215  Location: Toe (Comment  which one)  Location Orientation: Anterior;Right  Staging:  Deep Tissue Pressure Injury - Purple or maroon localized area of discolored intact skin or blood-filled blister due to damage of underlying soft tissue from pressure and/or shear.  Wound Description (Comments): right great toe  Present on Admission:      Pressure Injury 03/06/24 Hip Anterior;Left;Proximal Deep Tissue Pressure Injury - Purple or maroon localized area of discolored intact skin or blood-filled blister due to damage of underlying soft tissue from pressure and/or shear. Fluid filled blister w (Active)  03/06/24 1612  Location: Hip  Location Orientation: Anterior;Left;Proximal  Staging: Deep Tissue Pressure Injury - Purple or maroon localized area of discolored intact skin or blood-filled blister due to damage of underlying soft tissue from pressure and/or shear.  Wound Description (Comments): Fluid filled blister with purple discoloration  Present on Admission: No     Pressure Injury 03/06/24 Shoulder Left Deep Tissue Pressure Injury - Purple or maroon localized area of discolored intact skin or blood-filled blister due to damage of underlying soft tissue from pressure and/or shear. dark purple discoloration to should (Active)  03/06/24 1613  Location: Shoulder  Location Orientation: Left  Staging: Deep Tissue Pressure Injury - Purple or maroon localized area of discolored intact skin or blood-filled blister due to damage of underlying soft tissue from pressure and/or shear.  Wound Description (Comments): dark purple discoloration to shoulder  Present on Admission: No       DVT prophylaxis: Eliquis  Code Status: DNR Family Communication: Updated patient and daughter at bedside. Disposition: SNF  Status is: Inpatient Remains inpatient appropriate because: Severity of illness   Consultants:  General Surgery: Dr. Ramiro Burly 03/04/2024 Cardiology: Dr. Hendrick Locke 03/04/2024 PCCM: Dr. Charon Copper 03/05/2024 Wound care RN, Ronni Colace, RN 03/05/2024 Palliative care: Dr. Alejos Husband  03/10/2024 ID: Dr. Artemio Larry 03/11/2024   Procedures:  CT chest abdomen and pelvis 03/04/2024 Chest x-ray 03/04/2024, 03/15/2024 Excisional debridement per general surgery: Dr. Alethea Andes III 03/05/2024  Antimicrobials:  Anti-infectives (From admission, onward)    Start     Dose/Rate Route Frequency Ordered Stop   03/16/24 1015  cefTRIAXone (ROCEPHIN) 2 g in sodium chloride  0.9 % 100 mL IVPB        2 g 200 mL/hr over 30 Minutes Intravenous Every 24 hours 03/16/24 0929 03/21/24 1014   03/16/24 1015  doxycycline (VIBRA-TABS) tablet 100 mg        100 mg Oral Every 12 hours 03/16/24 0929 03/21/24 0959   03/14/24 1000  metroNIDAZOLE  (FLAGYL ) tablet 500 mg        500 mg Oral Every 12 hours 03/14/24 0803 03/20/24 0959   03/13/24 1000  cefadroxil  (DURICEF) capsule 1,000 mg  Status:  Discontinued        1,000 mg Oral 2 times daily 03/12/24 1632 03/16/24 0929   03/07/24 2200  metroNIDAZOLE  (FLAGYL ) tablet 500 mg        500 mg Oral Every 12 hours 03/07/24 0840 03/11/24 2258   03/05/24 1930  vancomycin  (VANCOREADY) IVPB 1250 mg/250 mL  Status:  Discontinued        1,250 mg 166.7 mL/hr over 90 Minutes Intravenous Every 24 hours 03/04/24 2205 03/04/24 2224   03/05/24 1930  Vancomycin  (VANCOCIN ) 1,250 mg in sodium chloride  0.9 % 250 mL IVPB  Status:  Discontinued  1,250 mg 166.7 mL/hr over 90 Minutes Intravenous Every 24 hours 03/04/24 2224 03/05/24 0717   03/05/24 1930  vancomycin  (VANCOREADY) IVPB 1500 mg/300 mL  Status:  Discontinued        1,500 mg 150 mL/hr over 120 Minutes Intravenous Every 24 hours 03/05/24 0717 03/12/24 1636   03/05/24 1400  ceFEPIme  (MAXIPIME ) 2 g in sodium chloride  0.9 % 100 mL IVPB  Status:  Discontinued        2 g 200 mL/hr over 30 Minutes Intravenous Every 8 hours 03/05/24 0717 03/12/24 1632   03/05/24 0600  ceFEPIme  (MAXIPIME ) 2 g in sodium chloride  0.9 % 100 mL IVPB  Status:  Discontinued        2 g 200 mL/hr over 30 Minutes Intravenous Every 12 hours 03/04/24 2205  03/05/24 0717   03/05/24 0000  metroNIDAZOLE  (FLAGYL ) IVPB 500 mg  Status:  Discontinued        500 mg 100 mL/hr over 60 Minutes Intravenous Every 12 hours 03/04/24 2206 03/04/24 2209   03/04/24 1730  aztreonam (AZACTAM) 2 g in sodium chloride  0.9 % 100 mL IVPB  Status:  Discontinued        2 g 200 mL/hr over 30 Minutes Intravenous  Once 03/04/24 1715 03/04/24 1724   03/04/24 1730  metroNIDAZOLE  (FLAGYL ) IVPB 500 mg  Status:  Discontinued        500 mg 100 mL/hr over 60 Minutes Intravenous Every 12 hours 03/04/24 1715 03/07/24 0840   03/04/24 1730  vancomycin  (VANCOREADY) IVPB 1500 mg/300 mL        1,500 mg 150 mL/hr over 120 Minutes Intravenous  Once 03/04/24 1715 03/04/24 2153   03/04/24 1730  ceFEPIme  (MAXIPIME ) 2 g in sodium chloride  0.9 % 100 mL IVPB        2 g 200 mL/hr over 30 Minutes Intravenous  Once 03/04/24 1724 03/04/24 1826         Subjective: Patient sitting up in bed having a bowel movement.  Denies any chest pain or significant shortness of breath.  No abdominal pain.  Noted to have tolerated pured diet per daughter yesterday and ate more.  SLP and palliative care at bedside.    Objective: Vitals:   03/17/24 2024 03/18/24 0008 03/18/24 0341 03/18/24 0916  BP: (!) 115/49 (!) 117/54 113/60 102/60  Pulse: 90 92 86 95  Resp: 18 18 18 18   Temp: 98.2 F (36.8 C) 98.1 F (36.7 C) 98.1 F (36.7 C) (!) 97.2 F (36.2 C)  TempSrc: Oral Oral Oral   SpO2: 100% 100% 99% 100%  Weight:      Height:        Intake/Output Summary (Last 24 hours) at 03/18/2024 1059 Last data filed at 03/17/2024 1734 Gross per 24 hour  Intake 201 ml  Output --  Net 201 ml   Filed Weights   03/04/24 1656 03/15/24 0500  Weight: 68.5 kg 75.3 kg    Examination:  General exam: Frail.  Respiratory system: CTAB anterior lung fields.  No wheezes, no crackles, no rhonchi.  Fair air movement.  Normal respiratory effort.  Cardiovascular system: RRR no murmurs rubs or gallops.  No JVD.  No  pitting lower extremity edema.  Gastrointestinal system: Abdomen is soft, nontender, nondistended, positive bowel sounds.  No rebound.  No guarding. Central nervous system: Alert and oriented. No focal neurological deficits. Extremities: Symmetric 5 x 5 power. Skin: No rashes, lesions or ulcers Psychiatry: Judgement and insight appear normal. Mood & affect appropriate.  Data Reviewed: I have personally reviewed following labs and imaging studies  CBC: Recent Labs  Lab 03/12/24 0652 03/14/24 0708 03/16/24 0827 03/18/24 0539  WBC 13.0* 11.9* 11.5* 9.8  NEUTROABS  --   --   --  6.9  HGB 8.4* 8.8* 8.9* 8.3*  HCT 24.7* 26.0* 26.1* 24.7*  MCV 84.9 85.0 85.3 86.1  PLT 342 410* 497* 513*    Basic Metabolic Panel: Recent Labs  Lab 03/12/24 0652 03/14/24 0708 03/16/24 0827 03/18/24 0539  NA 135  --  137 136  K 4.1  --  4.2 3.7  CL 108  --  105 102  CO2 23  --  25 25  GLUCOSE 157*  --  191* 120*  BUN 17  --  20 19  CREATININE 0.79  --  0.77 0.71  CALCIUM  7.5*  --  8.0* 7.6*  MG  --   --   --  1.7  PHOS 2.9 2.9 3.0 2.9    GFR: Estimated Creatinine Clearance: 75.8 mL/min (by C-G formula based on SCr of 0.71 mg/dL).  Liver Function Tests: Recent Labs  Lab 03/18/24 0539  ALBUMIN  <1.5*    CBG: Recent Labs  Lab 03/17/24 1238 03/17/24 1544 03/17/24 2027 03/17/24 2139 03/18/24 0917  GLUCAP 259* 137* 62* 103* 149*     No results found for this or any previous visit (from the past 240 hours).       Radiology Studies: No results found.       Scheduled Meds:  acetaminophen   500 mg Oral TID   amiodarone   200 mg Oral Daily   apixaban   5 mg Oral BID   ascorbic acid   500 mg Oral BID   atorvastatin   40 mg Oral Daily   Chlorhexidine  Gluconate Cloth  6 each Topical Daily   collagenase    Topical Daily   doxycycline  100 mg Oral Q12H   feeding supplement  237 mL Oral TID BM   ferrous sulfate   325 mg Oral QHS   furosemide   20 mg Oral Daily   guaiFENesin    600 mg Oral BID   insulin  aspart  0-15 Units Subcutaneous TID WC   insulin  aspart  0-5 Units Subcutaneous QHS   insulin  aspart  3 Units Subcutaneous TID WC   insulin  glargine-yfgn  15 Units Subcutaneous Daily   levothyroxine   50 mcg Oral Q0600   liver oil-zinc  oxide   Topical BID   metroNIDAZOLE   500 mg Oral Q12H   midodrine   5 mg Oral TID WC   nutrition supplement (JUVEN)  1 packet Oral BID BM   oxyCODONE   10 mg Oral Q12H   phosphorus  500 mg Oral TID   polyethylene glycol  17 g Oral BID   senna-docusate  1 tablet Oral BID   sodium chloride  flush  3 mL Intravenous Q12H   Tafamidis   1 capsule Oral Daily   zinc  sulfate (50mg  elemental zinc )  220 mg Oral Daily   Continuous Infusions:  cefTRIAXone (ROCEPHIN)  IV 2 g (03/18/24 1037)     LOS: 14 days    Time spent: 40 minutes    Hilda Lovings, MD [facility]   To contact the attending provider between 7A-7P or the covering provider during after hours 7P-7A, please log into the web site [email or web address] and access using universal [place] password for that web site. If you do not have the password, please call the hospital operator.  03/18/2024, 10:59 AM

## 2024-03-18 NOTE — Progress Notes (Signed)
 Physical Therapy Treatment Patient Details Name: Earl [name] MRN: [id number] DOB: 04/10/1941 Today's Date: 03/18/2024   History of Present Illness 83 y.o. male brought to the ED 4/14 from home by EMS with SoB and sacral pain. Found to have acute metabolic encephalopathy, hypotensive, hypoglycemic. Admitted to sepsis secondary to sacral pressure ulcer wound infection, Persistent atrial fibrillation/flutter with RVR  PMH 3/25 COVID admission, DM2, HTN, HLD, CHF, A-fib on Eliquis , stroke with residual right-sided weakness, MGUS, GERD, BPH, hypothyroidism    PT Comments  Pt very debilitated with pain at wound site at sacrum and t/o all joints with ROM, however pain did improve with ROM. Pt with noted edema in bilat lower legs and UEs with c/o stiffness and tightness. Pt was able to transfer to EOB with maxA and tolerate 15 min with R lateral lean to decrease pressure on sacral wound. Pt very motivated as pt was standing and amb shorter distances less than a month ago however if very limited by pain. Pt continued to give great effort despite pain. Dtr present and appreciative of PT services. Acute PT to cont to follow.    If plan is discharge home, recommend the following: Two people to help with walking and/or transfers;A lot of help with bathing/dressing/bathroom;Assistance with cooking/housework;Assistance with feeding;Direct supervision/assist for medications management;Direct supervision/assist for financial management;Assist for transportation;Help with stairs or ramp for entrance   Can travel by private vehicle     No  Equipment Recommendations  Hospital bed;Hoyer lift    Recommendations for Other Services       Precautions / Restrictions Precautions Precautions: Fall Recall of Precautions/Restrictions: Intact Restrictions Weight Bearing Restrictions Per Provider Order: No     Mobility  Bed Mobility Overal bed mobility: Needs Assistance Bed Mobility: Rolling Rolling: +2 for  physical assistance, Max assist Sidelying to sit: HOB elevated, Used rails, Max assist Supine to sit: Max assist, HOB elevated, Used rails     General bed mobility comments: with tactile cues and modA to reach across with L UE to grab bed rail, maxA for LE management of EOB and then for trunk elevation. Pt with R lateral lean due to sacral wound    Transfers                        Ambulation/Gait                   Stairs             Wheelchair Mobility     Tilt Bed    Modified Rankin (Stroke Patients Only)       Balance Overall balance assessment: Needs assistance Sitting-balance support: Feet supported Sitting balance-Leahy Scale: Zero Sitting balance - Comments: dependent to maintain EOB balance, PT sat next to patient on patients right side for support, pt gradually became more comfortable/less resistant requiring modA to maitnain EOB balance. worked on forward lean to promote hip flexion, truncal rotation and LAQ, tolerated 15 min of sitting EOB Postural control: Posterior lean                                  Communication Communication Communication: Impaired Factors Affecting Communication: Difficulty expressing self  Cognition Arousal: Alert Behavior During Therapy: WFL for tasks assessed/performed   PT - Cognitive impairments: Problem solving, Memory  PT - Cognition Comments: slowed processing, impaired memory Following commands: Impaired Following commands impaired: Follows one step commands with increased time, Follows multi-step commands with increased time    Cueing Cueing Techniques: Verbal cues, Tactile cues, Visual cues  Exercises General Exercises - Lower Extremity Long Arc Quad: AAROM, Both, 5 reps, Seated Other Exercises Other Exercises: bilat LE passive ROM, bilat shld passive ROM Other Exercises: truncal rotation while sitting EOB with assist    General Comments General  comments (skin integrity, edema, etc.): pt with sacral wound, pt with + BM, RN tech came to aide in hygiene      Pertinent Vitals/Pain Pain Assessment Pain Assessment: Faces Faces Pain Scale: Hurts worst Consolability: distracted or reassured by voice/touch Pain Location: sacrum, bilat knees and shlds Pain Descriptors / Indicators: Grimacing, Discomfort, Tightness Pain Intervention(s): Limited activity within patient's tolerance    Home Living                          Prior Function            PT Goals (current goals can now be found in the care plan section) Acute Rehab PT Goals Patient Stated Goal: To improve strength and mobility to reduce risk for falls. PT Goal Formulation: With patient/family Time For Goal Achievement: 03/25/24 Potential to Achieve Goals: Fair Progress towards PT goals: Progressing toward goals    Frequency    Min 1X/week      PT Plan      Co-evaluation              AM-PAC PT "6 Clicks" Mobility   Outcome Measure  Help needed turning from your back to your side while in a flat bed without using bedrails?: Total Help needed moving from lying on your back to sitting on the side of a flat bed without using bedrails?: Total Help needed moving to and from a bed to a chair (including a wheelchair)?: Total Help needed standing up from a chair using your arms (e.g., wheelchair or bedside chair)?: Total Help needed to walk in hospital room?: Total Help needed climbing 3-5 steps with a railing? : Total 6 Click Score: 6    End of Session   Activity Tolerance: Patient limited by pain Patient left: in bed;with call bell/phone within reach;with family/visitor present (RN tech present to aide in hygiene s/p BM) Nurse Communication: Need for lift equipment PT Visit Diagnosis: Muscle weakness (generalized) (M62.81);Adult, failure to thrive (R62.7);Difficulty in walking, not elsewhere classified (R26.2) Pain - part of body:  (sacrum and all  joints with ROM)     Time: 1610-9604 PT Time Calculation (min) (ACUTE ONLY): 40 min  Charges:    $Therapeutic Exercise: 23-37 mins $Therapeutic Activity: 8-22 mins PT General Charges $$ ACUTE PT VISIT: 1 Visit                     Renaee Caro, PT, DPT Acute Rehabilitation Services Secure chat preferred Office #: [phone]    Jenna Moan 03/18/2024, 9:03 AM

## 2024-03-18 NOTE — Progress Notes (Signed)
 Daily Progress Note   Patient Name: Earl [name]       Date: 03/18/2024 DOB: 19-Nov-1941  Age: 83 y.o. MRN#: [id number] Attending Physician: Armenta Landau, MD Primary Care Physician: [name], MD Admit Date: 03/04/2024  Reason for Consultation/Follow-up: Establishing goals of care  Subjective: Medical records reviewed including progress notes, labs, imaging. MAR reviewed and patient has taken 2 doses of PRN Dilaudid  in the past 24 hours, 0 doses of PRN oxycodone .  Patient assessed at the bedside.  He is alert, interactive and participated in GOC discussion today.  His daughter is present visiting.  RN and SLP participated in discussion today regarding goals of care, nutritional status, and concerns with lethargy after pain medication initiation.  MD also came to the bedside during the conversation.  We discussed daughter's concerns that many supplements such as Magic cup and mighty shake have been out of stock for several days.  I offered to reach out to RD for exploration of alternative options.  Patient shares that his goal is to improve his oral intake and continue trying to recover from his acute and chronic illnesses.  He is motivated to progress, though limited by pain.  Discussed the option of decreasing dose of as needed short acting opioid medications with the hope that he is more alert for continued emphasis on oral intake.  Patient and daughter verbalized understanding and agreement.  Discussed the importance of continued conversations surrounding his pain and overall suffering, including how to proceed if he still is not eating enough or if he reaches a point that he feels his symptom burden is excessive/his quality of life is unacceptable.  Briefly introduced the concept of comfort focused care.  I shared my concern  that PEG would likely not benefit him and SLP was in agreement.  Patient nodded his head "no" that he would not want this.  Patient's daughter agrees that it would not be beneficial for him to receive PEG in the long run.  Questions and concerns addressed. PMT will continue to support holistically.   Length of Stay: 14   Physical Exam Vitals and nursing note reviewed.  Constitutional:      General: He is not in acute distress.    Appearance: He is ill-appearing.  Cardiovascular:     Rate and Rhythm: Normal rate.  Pulmonary:     Effort: Pulmonary effort is normal.  Neurological:     Mental Status: He is alert.  Psychiatric:        Behavior: Behavior normal. Behavior is cooperative.            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    Palliative care evaluated [place] earlier this month and have been asked to get re-involved for additional goals of care conversations.   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  Goal is for SNF placement when medically optimized Patient would not benefit from PEG; he and his daughter are in agreement Decreased oxycodone  by 50% to 2.5 mg p.o. every 4 hours as needed Discontinued IV Dilaudid  and ordered morphine IV 1 mg every 3 hours for severe pain uncontrolled by p.o. oxycodone  Ongoing goals of care discussions pending clinical course Psychosocial and emotional support provided PMT will continue to follow and support   Prognosis: Concerning prognosis in light of recurrent hospitalizations, declining functional/nutritional status, and several chronic comorbidities  Discharge Planning: To Be Determined  Care plan was discussed with patient, patient's daughter, MD, SLP, RN          Angeline Kemps, PA-C  Palliative Medicine Team Team phone # [phone]  Thank you for allowing the Palliative Medicine Team to assist in the care of this patient. Please utilize secure chat with additional questions, if there is no response within 30 minutes please call the above phone number.  Palliative Medicine Team providers are available by phone from 7am to 7pm daily and can be reached through the team cell phone.  Should this patient require assistance outside of these hours, please call the patient's attending physician.

## 2024-03-18 NOTE — Plan of Care (Signed)
 Assumed care at 1900. Pt has been Aox4 overnight. Pt has expressed complaints of pain, see MAR. Pt has been repositioned Q2 hours with prior medication. BM noted overnight. Family at bedside. Pt tolerated puree meals. No significant events at this time.    Problem: Fluid Volume: Goal: Hemodynamic stability will improve Outcome: Progressing   Problem: Clinical Measurements: Goal: Diagnostic test results will improve Outcome: Progressing Goal: Signs and symptoms of infection will decrease Outcome: Progressing   Problem: Respiratory: Goal: Ability to maintain adequate ventilation will improve Outcome: Progressing   Problem: Education: Goal: Knowledge of risk factors and measures for prevention of condition will improve Outcome: Progressing   Problem: Coping: Goal: Psychosocial and spiritual needs will be supported Outcome: Progressing   Problem: Respiratory: Goal: Will maintain a patent airway Outcome: Progressing Goal: Complications related to the disease process, condition or treatment will be avoided or minimized Outcome: Progressing   Problem: Education: Goal: Ability to describe self-care measures that may prevent or decrease complications (Diabetes Survival Skills Education) will improve Outcome: Progressing Goal: Individualized Educational Video(s) Outcome: Progressing   Problem: Coping: Goal: Ability to adjust to condition or change in health will improve Outcome: Progressing   Problem: Fluid Volume: Goal: Ability to maintain a balanced intake and output will improve Outcome: Progressing   Problem: Health Behavior/Discharge Planning: Goal: Ability to identify and utilize available resources and services will improve Outcome: Progressing Goal: Ability to manage health-related needs will improve Outcome: Progressing   Problem: Metabolic: Goal: Ability to maintain appropriate glucose levels will improve Outcome: Progressing   Problem: Nutritional: Goal:  Maintenance of adequate nutrition will improve Outcome: Progressing Goal: Progress toward achieving an optimal weight will improve Outcome: Progressing   Problem: Skin Integrity: Goal: Risk for impaired skin integrity will decrease Outcome: Progressing   Problem: Tissue Perfusion: Goal: Adequacy of tissue perfusion will improve Outcome: Progressing   Problem: Education: Goal: Knowledge of General Education information will improve Description: Including pain rating scale, medication(s)/side effects and non-pharmacologic comfort measures Outcome: Progressing   Problem: Health Behavior/Discharge Planning: Goal: Ability to manage health-related needs will improve Outcome: Progressing   Problem: Clinical Measurements: Goal: Ability to maintain clinical measurements within normal limits will improve Outcome: Progressing Goal: Will remain free from infection Outcome: Progressing Goal: Diagnostic test results will improve Outcome: Progressing Goal: Respiratory complications will improve Outcome: Progressing Goal: Cardiovascular complication will be avoided Outcome: Progressing   Problem: Activity: Goal: Risk for activity intolerance will decrease Outcome: Progressing   Problem: Nutrition: Goal: Adequate nutrition will be maintained Outcome: Progressing   Problem: Coping: Goal: Level of anxiety will decrease Outcome: Progressing   Problem: Elimination: Goal: Will not experience complications related to bowel motility Outcome: Progressing Goal: Will not experience complications related to urinary retention Outcome: Progressing   Problem: Pain Managment: Goal: General experience of comfort will improve and/or be controlled Outcome: Progressing   Problem: Safety: Goal: Ability to remain free from injury will improve Outcome: Progressing   Problem: Skin Integrity: Goal: Risk for impaired skin integrity will decrease Outcome: Progressing

## 2024-03-18 NOTE — Consult Note (Signed)
 WOC Nurse wound follow up Wound type: Multiple pressure injuries 1 - Left Hip PI stage 2 Measurement: 5 x 7 x 0.1 cm Wound bed: 100% red, blister ruptured, partial raised skin. Drainage (amount, consistency, odor) Moderated weeping, no odor, serous. Periwound: Dressing procedure/placement/frequency: Apply Xeroform, cover with foam dressing, change daily.   2 - Left shoulder DTPI Measurement: 3 x 2 x 0.1 cm Wound bed: 100% dark red, blister ruptured. Drainage (amount, consistency, odor) Minimum weeping, no odor, serous. Periwound: intact Dressing procedure/placement/frequency: Apply Medihoney (change daily) and cover  with foam dressing, change every 3 days or PRN.   3 - Sacral Unstageable PI Measurement: 6.5 x 5.5 x 2 cm Wound bed: 60% yellow, 40% beefy red. Drainage (amount, consistency, odor) Minimum amount, no odor, serous Periwound: intacts, white scar tissue surrounding, viable edges. Dressing procedure/placement/frequency: Cleanse with Vashe#[id number]. Apply Aquacel [id number], cover with sacral foam dressing, change daily or PRN soiling.  4 - Great toe DTPI Measurement: 1 x 1 cm Wound bed: no skin breakdown, black/purple coloration. Drainage (amount, consistency, odor) NONE Periwound: Intact, dry Dressing procedure/placement/frequency: Protect with foam dressing, change every 3 days or PRN.  5 - Right heel Measurement: 2 x 1.2 cm Wound bed: no skin breakdown, black/purple coloration. Drainage (amount, consistency, odor) NONE Periwound: Intact, dry Dressing procedure/placement/frequency: Protect with foam dressing, change every 3 days or PRN.  Obs: Apply foam dressing to prevent further injury on Left heel. Recommended FlexiSeal regarding his frequent loose stools. Discussed with the MD on [place] about. The MD will adjust his medicines (Miralax  and Senokot).   WOC team will follow weekly.   Please reconsult if further assistance is needed. Thank-you,  Rachel Budds BSN, RN, [employer], WOC  (Pager: [phone])

## 2024-03-18 NOTE — Progress Notes (Signed)
 Physical Therapy Wound Treatment Patient Details  Name: Earl [name] MRN: [id number] Date of Birth: August 23, 1941  Today's Date: 03/18/2024 Time: 3244-0102 Time Calculation (min): 84 min  Subjective  Subjective Assessment Subjective: Pt and daughter pleasant and agreeable to wound therapy. Daughter reports pt has had incontinence of bowel again and needs clean up. Patient and Family Stated Goals: Heal wound Date of Onset:  (Unknown) Prior Treatments: Dressing changes, off weighting  Pain Score:  Pt premedicated for pain, calling out in pain during positioning  Wound Assessment  Pressure Injury 02/26/24 Coccyx Mid Unstageable - Full thickness tissue loss in which the base of the injury is covered by slough (yellow, tan, gray, green or brown) and/or eschar (tan, brown or black) in the wound bed. (Active)  Dressing Type Foam - Lift dressing to assess site every shift;Alginate;Barrier Film (skin prep);Moist to moist 03/18/24 1337  Dressing Changed;Clean, Dry, Intact 03/18/24 1337  Dressing Change Frequency Daily 03/18/24 1337  State of Healing Early/partial granulation 03/18/24 1337  Site / Wound Assessment Painful;Pink;Red;Yellow 03/18/24 1337  % Wound base Red or Granulating 60% 03/18/24 1337  % Wound base Yellow/Fibrinous Exudate 40% 03/18/24 1337  % Wound base Black/Eschar 0% 03/18/24 1337  % Wound base Other/Granulation Tissue (Comment) 0% 03/18/24 1337  Peri-wound Assessment Pink 03/18/24 1337  Wound Length (cm) 8.1 cm 03/15/24 1432  Wound Width (cm) 4.8 cm 03/15/24 1432  Wound Depth (cm) 2.8 cm 03/15/24 1432  Wound Surface Area (cm^2) 38.88 cm^2 03/15/24 1432  Wound Volume (cm^3) 108.86 cm^3 03/15/24 1432  Tunneling (cm) 12:00-2:00 4.5 cm; 2:00 5.2 cm 03/15/24 1432  Undermining (cm) 0 03/15/24 1432  Margins Unattached edges (unapproximated) 03/18/24 1337  Drainage Amount Minimal 03/18/24 1337  Drainage Description Serosanguineous;Purulent 03/18/24 1337  Treatment Debridement  (Selective);Irrigation;Other (Comment) 03/18/24 1337      Selective Debridement (non-excisional) Selective Debridement (non-excisional) - Location: Sacrum Selective Debridement (non-excisional) - Tools Used: Forceps, Scalpel, Scissors Selective Debridement (non-excisional) - Tissue Removed: yellow unviable tissue    Wound Assessment and Plan  Wound Therapy - Assess/Plan/Recommendations Wound Therapy - Clinical Statement: Wound bed appears improved. Pt with frequent loose stools this session and unable to visualize wound when WOC RN was present. Undermining/tunneled area and wound bed packed with Aquacel Ag. Extensive discussion with daughter regarding current wound therapy procedure with dressing change and recommendations that were discussed with WOC RN. This patient will benefit from continued selective removal of unviable tissue, to decrease bioburden, and promote wound bed healing. Wound Therapy - Functional Problem List: Decreased tolerance for position changes or OOB. Factors Delaying/Impairing Wound Healing: Diabetes Mellitus, Immobility, Infection - systemic/local, Multiple medical problems Hydrotherapy Plan: Debridement, Dressing change, Patient/family education Wound Therapy - Frequency: 2X / week Wound Therapy - Follow Up Recommendations: dressing changes by RN  Wound Therapy Goals- Improve the function of patient's integumentary system by progressing the wound(s) through the phases of wound healing (inflammation - proliferation - remodeling) by: Wound Therapy Goals - Improve the function of patient's integumentary system by progressing the wound(s) through the phases of wound healing by: Decrease Necrotic Tissue to: 20% Decrease Necrotic Tissue - Progress: Progressing toward goal Increase Granulation Tissue to: 80% Increase Granulation Tissue - Progress: Progressing toward goal Goals/treatment plan/discharge plan were made with and agreed upon by patient/family: Yes Time For  Goal Achievement: 7 days Wound Therapy - Potential for Goals: Good  Goals will be updated until maximal potential achieved or discharge criteria met.  Discharge criteria: when goals achieved, discharge from hospital, MD decision/surgical  intervention, no progress towards goals, refusal/missing three consecutive treatments without notification or medical reason.  GP     Charges PT Wound Care Charges $Wound Debridement up to 20 cm: < or equal to 20 cm $PT Hydrotherapy Dressing: 3 dressings $PT Hydrotherapy Visit: 1 Visit       [name] 03/18/2024, 1:47 PM  Simone Dubois, PT, DPT Acute Rehabilitation Services Secure Chat Preferred Office: [phone]

## 2024-03-18 NOTE — Progress Notes (Signed)
 Speech Language Pathology Treatment: Dysphagia  Patient Details Name: Earl [name] MRN: [id number] DOB: 1941-05-16 Today's Date: 03/18/2024 Time: 4098-1191 SLP Time Calculation (min) (ACUTE ONLY): 39 min  Assessment / Plan / Recommendation Clinical Impression  Pt seen for ongoing dysphagia management.  New orders placed give difficulty with PO intake over weekend.  Pt downgraded to puree diet after discussion with family, nursing, and MD yesterday.  Pt has been too lethargic (like at least partially d/t pain medications) to orally transit food, leading to pocketing.  Daughter Para reports meals are taking 60-90 minutes for 9 bites.  She felt he consumed more with puree texture yesterday.  Today pt was more alert having not received pain medication this morning and consumed puree and solid texture without difficulty.  Meds were given crushed in puree which required liquid wash to clear.  Pt took one caplet whole with puree without difficulty while alert, though pocketing of medications was observed over weekend per family report.  There was intermittent wet vocal quality following thin liquids.  This was improved with throat clear.  SLP held straw low to facilitate chin tuck, as pt does not reliably execute chin tuck independently.   Pt's overall medical situation is very complicated.  He has abnormal CXR with presumed pna, which may be 2/2 aspiration given hx of dysphagia, and continuing POs may present a risk.  However, given pressure wounds and chronic sacral wound pt needs to maximize oral nutrition for wound healing.  At present pt is unable to participate in swallowing evaluation by MBSS.  Discussed revisiting objective swallow study prior to discharge, when hopefully pt will be able to tolerate sitting upright in chair for MBS, but FEES may be preferred.  Depending on goals of care, swallow study may help facilitate decision making and minimize risk with PO intake.  Suspect pt is a poor  candidate for PEG and would not recommend in setting of risk of ongoing aspiration even with tube feeds.   Discussed barriers to nutrition with palliative care, MD, nursing, pt, and daughter in room. It seems that pt would like to advance diet texture, but is agreeable to continuing purees at this time. Daughter has noted pt was able to consume more puree without becoming as fatigued as with foods requiring mastication. She has noted that nutritional supplements (magic cup, might shake) have not been available with trays the last few days.  Pt may have some more advanced solid texture floor snacks when he is awake and alert and able to masticate solids. Will continue pureed diet at this time with hopeful advancement as pain management improves.  MD will attempt to optimize pain management with wakefulness to improve PO intake.  SLP will continue to follow for diet tolerance and advancement as well as possible instrumental study if pt is able participate prior to discharge.   Recommend puree diet with thin liquid with use of chin tuck.  When pt is awake/alert consider offering snacks of more advanced solids from floor stock.    HPI HPI: Patient is an 83 y.o. 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 He was discharged from the hospital (admitted for acute metabolic encephalopathy, hypotensive, hypoglycemic) four days ago to [facility] for rehab. He presented back to the hospital on 03/04/24 with SOB and pain in sacral area. He was admitted with sepsis secondary to sacral pressure wound/chronic pressure ulcer of sacral area stage III, chronic sacral wound  with suspicion of infection, Severe protein calorie malnutrition. SLP ordered due to suspicious for dysphagia.      SLP Plan  Continue with current plan of care (recommend instrumental prior to d/c when pt is able to participate)      Recommendations for follow  up therapy are one component of a multi-disciplinary discharge planning process, led by the attending physician.  Recommendations may be updated based on patient status, additional functional criteria and insurance authorization.    Recommendations  Diet recommendations: Dysphagia 1 (puree);Thin liquid Liquids provided via: Cup;Straw Medication Administration: Crushed with puree (whole with puree for meds which cannot be crushed) Supervision: Trained caregiver to feed patient Compensations: Slow rate;Small sips/bites;Chin tuck Postural Changes and/or Swallow Maneuvers: Seated upright 90 degrees;Upright 30-60 min after meal                  Oral care BID   Frequent or constant Supervision/Assistance Dysphagia, unspecified (R13.10)     Continue with current plan of care (recommend instrumental prior to d/c when pt is able to participate)     Elester Grim, MA, CCC-SLP Acute Rehabilitation Services Office: [phone] 03/18/2024, 11:25 AM

## 2024-03-19 ENCOUNTER — Other Ambulatory Visit: Payer: Self-pay

## 2024-03-19 DIAGNOSIS — Z7189 Other specified counseling: Secondary | ICD-10-CM | POA: Diagnosis not present

## 2024-03-19 DIAGNOSIS — E854 Organ-limited amyloidosis: Secondary | ICD-10-CM | POA: Diagnosis not present

## 2024-03-19 DIAGNOSIS — A419 Sepsis, unspecified organism: Secondary | ICD-10-CM | POA: Diagnosis not present

## 2024-03-19 DIAGNOSIS — I4891 Unspecified atrial fibrillation: Secondary | ICD-10-CM | POA: Diagnosis not present

## 2024-03-19 DIAGNOSIS — I5022 Chronic systolic (congestive) heart failure: Secondary | ICD-10-CM | POA: Diagnosis not present

## 2024-03-19 DIAGNOSIS — Z515 Encounter for palliative care: Secondary | ICD-10-CM | POA: Diagnosis not present

## 2024-03-19 LAB — GLUCOSE, CAPILLARY
Glucose-Capillary: 170 mg/dL — ABNORMAL HIGH (ref 70–99)
Glucose-Capillary: 231 mg/dL — ABNORMAL HIGH (ref 70–99)
Glucose-Capillary: 205 mg/dL — ABNORMAL HIGH (ref 70–99)
Glucose-Capillary: 174 mg/dL — ABNORMAL HIGH (ref 70–99)

## 2024-03-19 LAB — RENAL FUNCTION PANEL
Albumin: <1.5 g/dL — ABNORMAL LOW (ref 3.5–5.0)
Anion gap: 8 (ref 5–15)
BUN: 17 mg/dL (ref 8–23)
CO2: 26 mmol/L (ref 22–32)
Calcium: 7.5 mg/dL — ABNORMAL LOW (ref 8.9–10.3)
Chloride: 103 mmol/L (ref 98–111)
Creatinine, Ser: 0.65 mg/dL (ref 0.61–1.24)
GFR, Estimated: >60 mL/min (ref >60)
Glucose, Bld: 144 mg/dL — ABNORMAL HIGH (ref 70–99)
Phosphorus: 2.9 mg/dL (ref 2.5–4.6)
Potassium: 4.2 mmol/L (ref 3.5–5.1)
Sodium: 137 mmol/L (ref 135–145)

## 2024-03-19 LAB — CBC
HCT: 25 % — ABNORMAL LOW (ref 39.0–52.0)
Hemoglobin: 8.5 g/dL — ABNORMAL LOW (ref 13.0–17.0)
MCH: 29 pg (ref 26.0–34.0)
MCHC: 34 g/dL (ref 30.0–36.0)
MCV: 85.3 fL (ref 80.0–100.0)
Platelets: 529 10*3/uL — ABNORMAL HIGH (ref 150–400)
RBC: 2.93 MIL/uL — ABNORMAL LOW (ref 4.22–5.81)
RDW: 15.8 % — ABNORMAL HIGH (ref 11.5–15.5)
WBC: 9.9 10*3/uL (ref 4.0–10.5)
nRBC: 0 % (ref 0.0–0.2)

## 2024-03-19 LAB — MAGNESIUM: Magnesium: 2.2 mg/dL (ref 1.7–2.4)

## 2024-03-19 MED ORDER — SENNOSIDES-DOCUSATE SODIUM 8.6-50 MG PO TABS
1.0000 | ORAL_TABLET | Freq: Every evening | ORAL | Status: DC | PRN
  Filled 2024-03-19 (×2): qty 1

## 2024-03-19 MED ORDER — MAGNESIUM SULFATE 2 GM/50ML IV SOLN
2.0000 g | Freq: Once | INTRAVENOUS | Status: AC
Start: 2024-03-19 — End: 2024-03-19
  Administered 2024-03-19: 2 g via INTRAVENOUS
  Filled 2024-03-19: qty 50

## 2024-03-19 MED ORDER — ALBUMIN HUMAN 25 % IV SOLN
25.0000 g | Freq: Four times a day (QID) | INTRAVENOUS | Status: AC
  Administered 2024-03-19 – 2024-03-20 (×4): 25 g via INTRAVENOUS
  Filled 2024-03-19 (×4): qty 100

## 2024-03-19 MED ORDER — POTASSIUM CHLORIDE 20 MEQ PO PACK
40.0000 meq | PACK | Freq: Once | ORAL | Status: AC
  Administered 2024-03-19: 40 meq via ORAL
  Filled 2024-03-19: qty 2

## 2024-03-19 NOTE — Progress Notes (Signed)
 Speech Language Pathology Treatment: Dysphagia  Patient Details Name: Earl [name] MRN: [id number] DOB: 06-05-1941 Today's Date: 03/19/2024 Time: 7829-5621 SLP Time Calculation (min) (ACUTE ONLY): 25 min  Assessment / Plan / Recommendation Clinical Impression  Patient seen by SLP for skilled treatment focused on dysphagia goals. He was awake, alert and daughter was in the process of feeding him from his lunch tray (pureed solids, thin liquids). She reported that since changing to pureed solids from regular solids yesterday, it has increased his PO intake and decreased the time it takes her to feed him.  SLP discussed 12/2022 MBS which reported patient had aspiration with thin liquids but not with honey thick liquids. SLP spent time describing the benefits and limitations of using honey thick liquids. Daughter opted to continue with puree solids, thin liquids for now but she would like to have the option to try honey thick liquids for when patient is less alert. She is understanding that thin liquids will result in instances of aspiration as was indicated on MBS last year. SLP is in agreement with current plan and provided daughter with a sample pack of honey thick liquids. SLP will continue to follow.   HPI HPI: Patient is an 83 y.o. 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 He was discharged from the hospital (admitted for acute metabolic encephalopathy, hypotensive, hypoglycemic) four days ago to [facility] for rehab. He presented back to the hospital on 03/04/24 with SOB and pain in sacral area. 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 SLP ordered due to suspicious for dysphagia.      SLP Plan  Continue with current plan of care       Recommendations for follow up therapy are one component of a multi-disciplinary discharge planning process, led by the attending physician.  Recommendations may be updated based on patient status, additional functional criteria and insurance authorization.    Recommendations  Diet recommendations: Dysphagia 1 (puree);Thin liquid Liquids provided via: Cup;Straw Medication Administration: Crushed with puree Supervision: Trained caregiver to feed patient Compensations: Slow rate;Small sips/bites;Chin tuck Postural Changes and/or Swallow Maneuvers: Seated upright 90 degrees;Upright 30-60 min after meal                  Oral care BID   Frequent or constant Supervision/Assistance Dysphagia, unspecified (R13.10)     Continue with current plan of care    Jacqualine Mater, MA, CCC-SLP Speech Therapy

## 2024-03-19 NOTE — Progress Notes (Signed)
 PROGRESS NOTE    Earl [name]  NFA:[id number] DOB: 06-02-1941 DOA: 03/04/2024 PCP: [name], MD    No chief complaint on file.   Brief Narrative:  Patient is a 83 year old male with history of paroxysmal A-fib, insulin -dependent diabetes, hypertension, hyperlipidemia, CVA with residual right-sided weakness, MGUS, GERD, BPH, hypothyroidism, HFrEF with EF of 40 to 45%, chronic pancytopenia who presented to the emergency department from SNF with complaint of shortness of breath, wound, pain in the sacral area.  He was just discharged from here to [facility] rehab 4 days ago.  Hospital course was remarkable for acute metabolic encephalopathy, hypotension, hypoglycemia.  On presentation, he was hypotensive, EKG showed A-fib with RVR, tachypnea but saturating fine on room air.  Respiratory panel negative for COVID.  PCCM consulted for persistent hypotension.  Cardiology also consulted.  General surgery consulted for sacral ulcer.  Started on broad-spectrum antibiotics, amiodarone  drip.  Status post bedside excisional debridement of the sacral wound on 4/15 and 4/17.  Wound care, cardiology, general surgery following.  No plan for further operative intervention for sacral ulcer.  Now hemodynamically stable.Prolonged hospitalization.  Plan for SNF    Assessment & Plan:   Principal Problem:   Severe sepsis (HCC) Active Problems:   Paroxysmal atrial fibrillation (HCC)   Sepsis (HCC)   Atrial flutter (HCC)   Sacral ulcer (HCC)   MGUS (monoclonal gammopathy of unknown significance)   Essential hypertension   Hyperlipidemia   Insulin  dependent type 2 diabetes mellitus (HCC)   History of CVA (cerebrovascular accident)   Cardiac amyloidosis (HCC)   Chronic systolic CHF (congestive heart failure) (HCC)   GERD (gastroesophageal reflux disease)   History of hypertension   Atrial fibrillation with RVR (HCC)   Community acquired pneumonia  #1 sepsis secondary to sacral pressure  wound/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 - Patient noted to have an elevated lactic acid level, noted to have a leukocytosis, with a metabolic encephalopathy and noted to be hypotensive. - Patient pancultured with cultures with no growth to date. - Patient placed on IV fluids as well as empiric IV antibiotics. - Patient seen by PCCM and continued on midodrine . - BP improved on midodrine  5 mg 3 times daily which we will continue. - Continue current empiric antibiotics of Flagyl , IV Rocephin. - Continue current wound care management. - Follow.  2.  Chronic sacral wound with suspicion for infection -CT chest abdomen and pelvis done showed a sacral decubitus wound with extension to the right gluteus muscle with no definite bony lesions or signs of osteomyelitis.  No significant abscesses noted. - Patient was seen in consultation by general surgery and patient underwent bedside excisional debridement of the sacral wound on 4/15 and 4/17. - General Surgery recommended to continue twice daily wet-to-dry dressing changes with Dakin's. - No further surgical debridement planned per general surgery. - Wound care RN following. - Will likely need ongoing wound care on discharge and may need to follow-up in the outpatient wound care center on discharge. - Patient was seen in consultation by ID who had recommended an additional 7 days of antibiotics of cefadroxil  and Flagyl  through 4/30 with continuation of current wound care and nutritional support.  3.  History of paroxysmal atrial fibrillation -Noted to have presented A-fib with RVR. - Patient's Toprol  was discontinued during last hospitalization due to blood pressure issues and patient initially  started on amiodarone  drip. - Patient seen in consultation by cardiology, digoxin  discontinued and  patient transitioned to oral amiodarone . - Rate currently controlled as patient noted to be in and out of A-fib. - Eliquis  for anticoagulation.  4.  History of HFrEF/transthyretin cardiac amyloidosis/NICM/hypotension -Patient noted to have presented with hypotension, with concerns for sepsis. - Patient noted to have been on Entresto  and Toprol  at home which are currently on hold. - Patient initially received some IV fluid resuscitation. - Continue midodrine . -Patient started back on low-dose oral Lasix . - Patient was seen in consultation by cardiology during the hospitalization. - Outpatient follow-up with cardiology.  5.  Suspected left lower lobe pneumonia -Patient noted to have had a productive cough on 03/15/2024. - Chest x-ray done concerning for lower lobe consolidation however patient noted to be on room air and not in any acute respiratory distress. - Patient afebrile. - Leukocytosis noted to have trended down. - Patient noted to be on antibiotics secondary to problem #1 and 2. - Currently on IV Rocephin and doxycycline to complete a 5-day course of treatment. - May be able to transition to oral antibiotics when ready for discharge if still on IV antibiotics.  6.  Insulin -dependent diabetes mellitus type 2 -Hemoglobin A1c 8.4 (02/22/2024) - Patient noted with poor oral intake recently per daughter. - CBG noted at 170 this morning. - Daughter requested diet be changed to a pured diet which patient is currently on and tolerating.. - Continue Semglee  15 units daily, SSI.  7.  GERD - PPI.  8.  History of CVA with residual right-sided weakness - Continue Lipitor, Eliquis .   9.  Anemia of chronic disease -Hemoglobin stable at 8.5. -Follow H&H. -Transfusion threshold hemoglobin < 8. -Continue oral iron supplementation.  10.  Hypothyroidism - Synthroid .  11 recent history of COVID -Patient noted to be COVID-positive 02/14/2024. -Still positive on admission. - On room air.   - No need for further isolation.    12.  Severe protein calorie malnutrition/suspicion for dysphagia/hypophosphatemia -Patient was seen by speech therapy and diet liberalized to a regular diet. - Patient also on oral phosphorus supplementation. - Per daughter patient with difficulty chewing his food and requested a pured diet which patient seems to be tolerating. -Continue current pure/dysphagia 1 diet. -IV albumin  every 6 hours x 1 day. - SLP following.  Dietitian following.  13.  Constipation/diarrhea - Patient now with loose watery stools.   -Diarrhea improving after discontinuation of scheduled laxatives. - MiraLAX  changed to daily as needed.   - Will change Senokot to nightly as needed.    14.  Goals of care -Patient deconditioned elderly male currently bedbound since last recent hospitalization with continued debility and weakness.  Patient prior to recent hospitalization patient noted to have been living home with a caregiver 4 days after week, using a walker to aid with ambulation with good oral intake. - Patient noted with multiple comorbidities, now with a sacral pressure ulcer and admitted for sepsis.  Currently CODE STATUS is a DNR. - Palliative care consulted and following. - PT/ OT consulted recommended SNF. - TOC consulted for placement.  15.  Pressure injury Pressure Injury 02/26/24 Coccyx Mid Unstageable - Full thickness tissue loss in which the base of the injury is covered by slough (yellow, tan, gray, green or brown) and/or eschar (tan, brown or black) in the wound bed. (Active)  02/26/24 0916  Location: Coccyx  Location Orientation: Mid  Staging: Unstageable - Full thickness tissue loss in which  the base of the injury is covered by slough (yellow, tan, gray, green or brown) and/or eschar (tan, brown or black) in the wound bed.  Wound Description (Comments):   Present on Admission: No     Pressure Injury 02/26/24 Toe (Comment  which one) Anterior;Right Deep  Tissue Pressure Injury - Purple or maroon localized area of discolored intact skin or blood-filled blister due to damage of underlying soft tissue from pressure and/or shear. right gre (Active)  02/26/24 1215  Location: Toe (Comment  which one)  Location Orientation: Anterior;Right  Staging: Deep Tissue Pressure Injury - Purple or maroon localized area of discolored intact skin or blood-filled blister due to damage of underlying soft tissue from pressure and/or shear.  Wound Description (Comments): right great toe  Present on Admission:      Pressure Injury 03/06/24 Hip Anterior;Left;Proximal Deep Tissue Pressure Injury - Purple or maroon localized area of discolored intact skin or blood-filled blister due to damage of underlying soft tissue from pressure and/or shear. Fluid filled blister w (Active)  03/06/24 1612  Location: Hip  Location Orientation: Anterior;Left;Proximal  Staging: Deep Tissue Pressure Injury - Purple or maroon localized area of discolored intact skin or blood-filled blister due to damage of underlying soft tissue from pressure and/or shear.  Wound Description (Comments): Fluid filled blister with purple discoloration  Present on Admission: No     Pressure Injury 03/06/24 Shoulder Left Deep Tissue Pressure Injury - Purple or maroon localized area of discolored intact skin or blood-filled blister due to damage of underlying soft tissue from pressure and/or shear. dark purple discoloration to should (Active)  03/06/24 1613  Location: Shoulder  Location Orientation: Left  Staging: Deep Tissue Pressure Injury - Purple or maroon localized area of discolored intact skin or blood-filled blister due to damage of underlying soft tissue from pressure and/or shear.  Wound Description (Comments): dark purple discoloration to shoulder  Present on Admission: No       DVT prophylaxis: Eliquis  Code Status: DNR Family Communication: Updated patient and daughter at  bedside. Disposition: SNF  Status is: Inpatient Remains inpatient appropriate because: Severity of illness   Consultants:  General Surgery: Dr. Ramiro Burly 03/04/2024 Cardiology: Dr. Hendrick Locke 03/04/2024 PCCM: Dr. Charon Copper 03/05/2024 Wound care RN, Ronni Colace, RN 03/05/2024 Palliative care: Dr. Alejos Husband 03/10/2024 ID: Dr. Levern Reader 03/11/2024   Procedures:  CT chest abdomen and pelvis 03/04/2024 Chest x-ray 03/04/2024, 03/15/2024 Excisional debridement per general surgery: Dr. Alethea Andes III 03/05/2024  Antimicrobials:  Anti-infectives (From admission, onward)    Start     Dose/Rate Route Frequency Ordered Stop   03/16/24 1015  cefTRIAXone (ROCEPHIN) 2 g in sodium chloride  0.9 % 100 mL IVPB        2 g 200 mL/hr over 30 Minutes Intravenous Every 24 hours 03/16/24 0929 03/21/24 1014   03/16/24 1015  doxycycline (VIBRA-TABS) tablet 100 mg        100 mg Oral Every 12 hours 03/16/24 0929 03/21/24 0959   03/14/24 1000  metroNIDAZOLE  (FLAGYL ) tablet 500 mg        500 mg Oral Every 12 hours 03/14/24 0803 03/20/24 0959   03/13/24 1000  cefadroxil  (DURICEF) capsule 1,000 mg  Status:  Discontinued        1,000 mg Oral 2 times daily 03/12/24 1632 03/16/24 0929   03/07/24 2200  metroNIDAZOLE  (FLAGYL ) tablet 500 mg        500 mg Oral Every 12 hours 03/07/24 0840 03/11/24 2258   03/05/24 1930  vancomycin  (VANCOREADY) IVPB 1250  mg/250 mL  Status:  Discontinued        1,250 mg 166.7 mL/hr over 90 Minutes Intravenous Every 24 hours 03/04/24 2205 03/04/24 2224   03/05/24 1930  Vancomycin  (VANCOCIN ) 1,250 mg in sodium chloride  0.9 % 250 mL IVPB  Status:  Discontinued        1,250 mg 166.7 mL/hr over 90 Minutes Intravenous Every 24 hours 03/04/24 2224 03/05/24 0717   03/05/24 1930  vancomycin  (VANCOREADY) IVPB 1500 mg/300 mL  Status:  Discontinued        1,500 mg 150 mL/hr over 120 Minutes Intravenous Every 24 hours 03/05/24 0717 03/12/24 1636   03/05/24 1400  ceFEPIme  (MAXIPIME ) 2 g in sodium chloride  0.9 % 100 mL  IVPB  Status:  Discontinued        2 g 200 mL/hr over 30 Minutes Intravenous Every 8 hours 03/05/24 0717 03/12/24 1632   03/05/24 0600  ceFEPIme  (MAXIPIME ) 2 g in sodium chloride  0.9 % 100 mL IVPB  Status:  Discontinued        2 g 200 mL/hr over 30 Minutes Intravenous Every 12 hours 03/04/24 2205 03/05/24 0717   03/05/24 0000  metroNIDAZOLE  (FLAGYL ) IVPB 500 mg  Status:  Discontinued        500 mg 100 mL/hr over 60 Minutes Intravenous Every 12 hours 03/04/24 2206 03/04/24 2209   03/04/24 1730  aztreonam (AZACTAM) 2 g in sodium chloride  0.9 % 100 mL IVPB  Status:  Discontinued        2 g 200 mL/hr over 30 Minutes Intravenous  Once 03/04/24 1715 03/04/24 1724   03/04/24 1730  metroNIDAZOLE  (FLAGYL ) IVPB 500 mg  Status:  Discontinued        500 mg 100 mL/hr over 60 Minutes Intravenous Every 12 hours 03/04/24 1715 03/07/24 0840   03/04/24 1730  vancomycin  (VANCOREADY) IVPB 1500 mg/300 mL        1,500 mg 150 mL/hr over 120 Minutes Intravenous  Once 03/04/24 1715 03/04/24 2153   03/04/24 1730  ceFEPIme  (MAXIPIME ) 2 g in sodium chloride  0.9 % 100 mL IVPB        2 g 200 mL/hr over 30 Minutes Intravenous  Once 03/04/24 1724 03/04/24 1826         Subjective: Patient sitting up in bed, daughter at bedside discussing with speech therapy.  Patient denies any chest pain.  Denies any significant shortness of breath.  Overall feeling a little bit better.  Watery loose stools improving.   Objective: Vitals:   03/18/24 1457 03/18/24 2019 03/19/24 0420 03/19/24 0913  BP: (!) 116/53 (!) 108/57 (!) 104/53 (!) 100/52  Pulse: 94 89 90 91  Resp: 18 19 19 18   Temp: 97.7 F (36.5 C) 98.5 F (36.9 C) 98.9 F (37.2 C) 98.2 F (36.8 C)  TempSrc: Oral Oral Oral Axillary  SpO2: 98% 98% 97% 95%  Weight:      Height:        Intake/Output Summary (Last 24 hours) at 03/19/2024 1240 Last data filed at 03/19/2024 0826 Gross per 24 hour  Intake 150 ml  Output 3025 ml  Net -2875 ml   Filed Weights    03/04/24 1656 03/15/24 0500  Weight: 68.5 kg 75.3 kg    Examination:  General exam: Frail.  Respiratory system: Clear to auscultation bilaterally anterior lung fields.  No wheezes, no crackles, no rhonchi.  Fair air movement.  Speaking in full sentences.  Cardiovascular system: Regular rate rhythm no murmurs rubs or gallops.  No  JVD.  No pitting lower extremity edema.   Gastrointestinal system: Abdomen is soft, nontender, nondistended, positive bowel sounds.  No rebound.  No guarding.  Central nervous system: Alert and oriented. No focal neurological deficits. Extremities: Symmetric 5 x 5 power. Skin: No rashes, lesions or ulcers Psychiatry: Judgement and insight appear normal. Mood & affect appropriate.     Data Reviewed: I have personally reviewed following labs and imaging studies  CBC: Recent Labs  Lab 03/14/24 0708 03/16/24 0827 03/18/24 0539 03/19/24 0435  WBC 11.9* 11.5* 9.8 9.9  NEUTROABS  --   --  6.9  --   HGB 8.8* 8.9* 8.3* 8.5*  HCT 26.0* 26.1* 24.7* 25.0*  MCV 85.0 85.3 86.1 85.3  PLT 410* 497* 513* 529*    Basic Metabolic Panel: Recent Labs  Lab 03/14/24 0708 03/16/24 0827 03/18/24 0539 03/19/24 0435  NA  --  137 136 137  K  --  4.2 3.7 4.2  CL  --  105 102 103  CO2  --  25 25 26   GLUCOSE  --  191* 120* 144*  BUN  --  20 19 17   CREATININE  --  0.77 0.71 0.65  CALCIUM   --  8.0* 7.6* 7.5*  MG  --   --  1.7 2.2  PHOS 2.9 3.0 2.9 2.9    GFR: Estimated Creatinine Clearance: 75.8 mL/min (by C-G formula based on SCr of 0.65 mg/dL).  Liver Function Tests: Recent Labs  Lab 03/18/24 0539 03/19/24 0435  ALBUMIN  <1.5* <1.5*    CBG: Recent Labs  Lab 03/18/24 0917 03/18/24 1242 03/18/24 1459 03/18/24 2142 03/19/24 0840  GLUCAP 149* 268* 297* 138* 170*     No results found for this or any previous visit (from the past 240 hours).       Radiology Studies: No results found.       Scheduled Meds:  acetaminophen   500 mg Oral TID    amiodarone   200 mg Oral Daily   apixaban   5 mg Oral BID   ascorbic acid   500 mg Oral BID   atorvastatin   40 mg Oral Daily   Chlorhexidine  Gluconate Cloth  6 each Topical Daily   collagenase    Topical Daily   doxycycline  100 mg Oral Q12H   feeding supplement  237 mL Oral TID BM   ferrous sulfate   325 mg Oral QHS   furosemide   20 mg Oral Daily   guaiFENesin   600 mg Oral BID   insulin  aspart  0-15 Units Subcutaneous TID WC   insulin  aspart  0-5 Units Subcutaneous QHS   insulin  aspart  3 Units Subcutaneous TID WC   insulin  glargine-yfgn  15 Units Subcutaneous Daily   levothyroxine   50 mcg Oral Q0600   liver oil-zinc  oxide   Topical BID   metroNIDAZOLE   500 mg Oral Q12H   midodrine   5 mg Oral TID WC   nutrition supplement (JUVEN)  1 packet Oral BID BM   oxyCODONE   10 mg Oral Q12H   phosphorus  500 mg Oral TID   senna-docusate  1 tablet Oral QHS   sodium chloride  flush  3 mL Intravenous Q12H   Tafamidis   1 capsule Oral Daily   zinc  sulfate (50mg  elemental zinc )  220 mg Oral Daily   Continuous Infusions:  albumin  human     cefTRIAXone (ROCEPHIN)  IV 2 g (03/19/24 0930)     LOS: 15 days    Time spent: 40 minutes    Hilda Lovings, MD  [facility]   To contact the attending provider between 7A-7P or the covering provider during after hours 7P-7A, please log into the web site [email or web address] and access using universal [place] password for that web site. If you do not have the password, please call the hospital operator.  03/19/2024, 12:40 PM

## 2024-03-19 NOTE — Progress Notes (Addendum)
 Daily Progress Note   Patient Name: Earl [name]       Date: 03/19/2024 DOB: 07/06/1941  Age: 83 y.o. MRN#: [id number] Attending Physician: Armenta Landau, MD Primary Care Physician: [name], MD Admit Date: 03/04/2024  Reason for Consultation/Follow-up: Establishing goals of care  Subjective: Medical records reviewed including progress notes, labs, imaging. MAR reviewed and patient has received 4 doses of PRN morphine and 1 dose of PRN oxycodone  in the past 24 hours. Discussed with RN. Patient assessed at the bedside. He reports 8-9/10 pain that improves to around 7/10 after PRN morphine. His daughter is present visiting.  Explored patient's goals in pain control and functioning. He is having a hard time conceptualizing what would be an acceptable level of pain and quality of life during the time that he continues to make efforts at improving his oral intake and healing. His daughter will attempt to continue discussing this with him and understands his pain may vary as his wounds progress - they could become more painful or less painful, thus the importance of considering quality of life throughout the process with an unknown outcome. Patient seems to be doing well with decreased opioid dosages and his daughter would like to continue for at least another day to see how things go with his diet. I shared that I would be back on service Friday and encouraged patient/family to reach out with additional needs if they arise before then.  Addendum: received VM from patient's daughter with several concerns and returned her call. She does not feel discharge on Thursday is in patient's best interest, but rather next week at the earliest, after additional wound care and working with SLP/PT/OT. She is worried that he will just be readmitted  again within days. Emotional support and therapeutic listening was provided as she shared the change experienced from yesterday to today, feeling heard and now feeling dismissed. She is also worried about keeping her job while needing to be present for advocacy and support. Offered to pass along request for assistance with (FMLA?) paperwork to TOC/MD and also provided information for OPE.    Questions and concerns addressed. PMT will continue to support holistically.   Length of Stay: 15   Physical Exam Vitals and nursing note reviewed.  Constitutional:      General: He is not in acute distress.    Appearance: He is ill-appearing.  Cardiovascular:     Rate and Rhythm: Normal rate.  Pulmonary:     Effort: Pulmonary effort is normal.  Neurological:     Mental Status: He is alert.  Psychiatric:  Behavior: Behavior normal. Behavior is cooperative.            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    Palliative care evaluated Earl [name] earlier this month and have been asked to get re-involved for additional goals of care conversations.   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  Goal is for SNF placement when medically optimized Patient would not benefit from PEG; he and his daughter are in agreement Continue PRN IV morphine and PRN oxycodone  for breakthrough pain Continue Oxycontin  10mg  BID Psychosocial and emotional support provided PMT will continue to follow peripherally. I will be back on service 5/2   Prognosis: Concerning prognosis in light of recurrent hospitalizations, declining functional/nutritional status, and several chronic comorbidities  Discharge Planning: To Be Determined  Care plan was discussed with patient, patient's daughter, RN, RD          Earl Buehrle Alroy Jericho, PA-C  Palliative Medicine Team Team phone # [phone]  Thank you for allowing the Palliative Medicine Team to assist in the care of this patient. Please utilize secure chat with additional questions, if there is no response within 30 minutes please call the above phone number.  Palliative Medicine Team providers are available by phone from 7am to 7pm daily and can be reached through the team cell phone.  Should this patient require assistance outside of these hours, please call the patient's attending physician.

## 2024-03-19 NOTE — TOC Progression Note (Addendum)
 Transition of Care [facility]) - Progression Note    Patient Details  Name: Earl [name] MRN: [id number] Date of Birth: October 18, 1941  Transition of [facility]) CM/SW Contact  Dane Dung, RN Phone Number: 03/19/2024, 10:26 AM  Clinical Narrative:    CM met with the patient and daughter at the bedside to discuss patient's pending discharge plans.  The patient's daughter states that she wants the patient to remain in the hospital for wound care and refuses SNF placement once patient becomes medically stable for discharge.  Patient's daughter plans to appeal discharge to have patient remain in the hospital at this time. The patient's daughter was satisfied with rehabilitation services at [facility] - but states that she does not want the patient to be discharged to a rehabilitation facility.  I spoke with the daughter at the bedside and daughter states that patient was active with Outpatient Palliative Care services.  Daughter was unaware of the company name but [facility] palliative care notes are noted in the chart prior to this hospital admission.   Expected Discharge Plan: Skilled Nursing Facility Barriers to Discharge: Continued Medical Work up  Expected Discharge Plan and Services In-house Referral: Clinical Social Work     Living arrangements for the past 2 months:  (from [facility] short term rehab)                                       Social Determinants of Health (SDOH) Interventions SDOH Screenings   Food Insecurity: No Food Insecurity (03/05/2024)  Housing: Low Risk  (03/05/2024)  Transportation Needs: No Transportation Needs (03/05/2024)  Utilities: Not At Risk (03/05/2024)  Depression (PHQ2-9): Low Risk  (10/03/2023)  Financial Resource Strain: Patient Declined (01/16/2024)  Physical Activity: Unknown (01/16/2024)  Social Connections: Moderately Integrated (03/05/2024)  Stress: Stress Concern Present (01/16/2024)  Tobacco Use: Low Risk  (03/04/2024)     Readmission Risk Interventions    03/19/2024   10:24 AM  Readmission Risk Prevention Plan  Transportation Screening Complete  Medication Review (RN Care Manager) Complete  PCP or Specialist appointment within 3-5 days of discharge Complete  HRI or Home Care Consult Complete  SW Recovery Care/Counseling Consult Complete  Palliative Care Screening Complete  Skilled Nursing Facility Complete

## 2024-03-19 NOTE — Progress Notes (Signed)
 Occupational Therapy Treatment Patient Details Name: Earl [name] MRN: [id number] DOB: [birth year] Today's Date: 03/19/2024   History of present illness 83 y.o. male brought to the ED 4/14 from home by EMS with SoB and sacral pain. Found to have acute metabolic encephalopathy, hypotensive, hypoglycemic. Admitted to sepsis secondary to sacral pressure ulcer wound infection, Persistent atrial fibrillation/flutter with RVR  PMH 3/25 COVID admission, DM2, HTN, HLD, CHF, A-fib on Eliquis , stroke with residual right-sided weakness, MGUS, GERD, BPH, hypothyroidism   OT comments  Pt participated in bed mobility/rolling L and R with tactile cues and mod A to reach across with L UE and R UEs to grab bed rails each side, max A for LE management. L UE without edema as with previous OT session, but grimacing/soreness during ROM exercises. Pt unable to sit EOB due to sacral wound pain. Pt's daughter, son and daughter in law present and supportive. AAROM and PROM of B UEs in multiple planes to decrease edema and increase AROM/function. B UEs positioned in elevation at end of session. Reviewed ROM and positioning of B UEs with pt's family. OT will continue to follow acutely to maximize level of function and safety.       If plan is discharge home, recommend the following:  A lot of help with walking and/or transfers;A lot of help with bathing/dressing/bathroom;Assistance with cooking/housework;Assist for transportation;Help with stairs or ramp for entrance;Two people to help with walking and/or transfers   Equipment Recommendations  Other (comment) (defer)    Recommendations for Other Services      Precautions / Restrictions Precautions Precautions: Fall Recall of Precautions/Restrictions: Intact Restrictions Weight Bearing Restrictions Per Provider Order: No       Mobility Bed Mobility Overal bed mobility: Needs Assistance Bed Mobility: Rolling Rolling: Total assist         General bed  mobility comments: with tactile cues and mod A to reach across with L UE and R UEs to grab bed rails each side, max A for LE management    Transfers                   General transfer comment: unable to tolerate seated position due to sacral wound     Balance                                           ADL either performed or assessed with clinical judgement   ADL                                              Extremity/Trunk Assessment Upper Extremity Assessment Upper Extremity Assessment: Generalized weakness;Right hand dominant;RUE deficits/detail;LUE deficits/detail RUE Deficits / Details: R UE affected from CVA ~6-7 years ago per daughter but pt was able to perform ADLs and reach overhead prior to recent COVID LUE Deficits / Details: L UE without edema as with previous OT session, but grimacing/soreness during ROM   Lower Extremity Assessment Lower Extremity Assessment: Defer to PT evaluation        Vision Baseline Vision/History: 1 Wears glasses Ability to See in Adequate Light: 0 Adequate Patient Visual Report: No change from baseline     Perception     Praxis     Communication Communication Communication: Impaired Factors Affecting  Communication: Difficulty expressing self   Cognition Arousal: Alert Behavior During Therapy: WFL for tasks assessed/performed Cognition: No apparent impairments                                 Following commands impaired: Follows multi-step commands with increased time      Cueing   Cueing Techniques: Verbal cues, Visual cues  Exercises      Shoulder Instructions       General Comments      Pertinent Vitals/ Pain       Pain Assessment Pain Assessment: Faces Faces Pain Scale: Hurts little more Pain Location: UEs during AAROM, during bed mobility rolling Pain Descriptors / Indicators: Grimacing, Discomfort, Tightness Pain Intervention(s): Monitored during  session, Limited activity within patient's tolerance, Repositioned  Home Living                                          Prior Functioning/Environment              Frequency  Min 2X/week        Progress Toward Goals  OT Goals(current goals can now be found in the care plan section)  Progress towards OT goals: OT to reassess next treatment     Plan      Co-evaluation                 AM-PAC OT "6 Clicks" Daily Activity     Outcome Measure   Help from another person eating meals?: A Little Help from another person taking care of personal grooming?: A Little Help from another person toileting, which includes using toliet, bedpan, or urinal?: Total Help from another person bathing (including washing, rinsing, drying)?: Total Help from another person to put on and taking off regular upper body clothing?: Total Help from another person to put on and taking off regular lower body clothing?: Total 6 Click Score: 10    End of Session    OT Visit Diagnosis: Other abnormalities of gait and mobility (R26.89);Muscle weakness (generalized) (M62.81);History of falling (Z91.81);Pain Pain - Right/Left: Left Pain - part of body: Arm;Hand   Activity Tolerance Patient tolerated treatment well   Patient Left in bed;with call bell/phone within reach;with family/visitor present   Nurse Communication Mobility status        Time: 1131-1157 OT Time Calculation (min): 26 min  Charges: OT General Charges $OT Visit: 1 Visit OT Treatments $Therapeutic Activity: 8-22 mins $Therapeutic Exercise: 8-22 mins    [name] 03/19/2024, 1:46 PM

## 2024-03-19 NOTE — Plan of Care (Signed)
 Assumed care at 1900. Pt is Aox4. Pt has been resting in bed overnight with complaints of 10/10 pain, see MAR. Pt has been repositioned and offered fluids overnight. Pt had a 6 beat run of Vtach overnight. MD contacted, see orders.   Problem: Fluid Volume: Goal: Hemodynamic stability will improve Outcome: Progressing   Problem: Clinical Measurements: Goal: Diagnostic test results will improve Outcome: Progressing Goal: Signs and symptoms of infection will decrease Outcome: Progressing   Problem: Respiratory: Goal: Ability to maintain adequate ventilation will improve Outcome: Progressing   Problem: Education: Goal: Knowledge of risk factors and measures for prevention of condition will improve Outcome: Progressing   Problem: Coping: Goal: Psychosocial and spiritual needs will be supported Outcome: Progressing   Problem: Respiratory: Goal: Will maintain a patent airway Outcome: Progressing Goal: Complications related to the disease process, condition or treatment will be avoided or minimized Outcome: Progressing   Problem: Education: Goal: Ability to describe self-care measures that may prevent or decrease complications (Diabetes Survival Skills Education) will improve Outcome: Progressing Goal: Individualized Educational Video(s) Outcome: Progressing   Problem: Coping: Goal: Ability to adjust to condition or change in health will improve Outcome: Progressing   Problem: Fluid Volume: Goal: Ability to maintain a balanced intake and output will improve Outcome: Progressing   Problem: Health Behavior/Discharge Planning: Goal: Ability to identify and utilize available resources and services will improve Outcome: Progressing Goal: Ability to manage health-related needs will improve Outcome: Progressing   Problem: Metabolic: Goal: Ability to maintain appropriate glucose levels will improve Outcome: Progressing   Problem: Nutritional: Goal: Maintenance of adequate  nutrition will improve Outcome: Progressing Goal: Progress toward achieving an optimal weight will improve Outcome: Progressing   Problem: Skin Integrity: Goal: Risk for impaired skin integrity will decrease Outcome: Progressing   Problem: Tissue Perfusion: Goal: Adequacy of tissue perfusion will improve Outcome: Progressing   Problem: Education: Goal: Knowledge of General Education information will improve Description: Including pain rating scale, medication(s)/side effects and non-pharmacologic comfort measures Outcome: Progressing   Problem: Health Behavior/Discharge Planning: Goal: Ability to manage health-related needs will improve Outcome: Progressing   Problem: Clinical Measurements: Goal: Ability to maintain clinical measurements within normal limits will improve Outcome: Progressing Goal: Will remain free from infection Outcome: Progressing Goal: Diagnostic test results will improve Outcome: Progressing Goal: Respiratory complications will improve Outcome: Progressing Goal: Cardiovascular complication will be avoided Outcome: Progressing   Problem: Activity: Goal: Risk for activity intolerance will decrease Outcome: Progressing   Problem: Nutrition: Goal: Adequate nutrition will be maintained Outcome: Progressing   Problem: Coping: Goal: Level of anxiety will decrease Outcome: Progressing   Problem: Pain Managment: Goal: General experience of comfort will improve and/or be controlled Outcome: Progressing   Problem: Safety: Goal: Ability to remain free from injury will improve Outcome: Progressing   Problem: Skin Integrity: Goal: Risk for impaired skin integrity will decrease Outcome: Progressing

## 2024-03-20 ENCOUNTER — Encounter: Payer: Self-pay | Admitting: Cardiology

## 2024-03-20 DIAGNOSIS — E854 Organ-limited amyloidosis: Secondary | ICD-10-CM | POA: Diagnosis not present

## 2024-03-20 DIAGNOSIS — D472 Monoclonal gammopathy: Secondary | ICD-10-CM | POA: Diagnosis not present

## 2024-03-20 DIAGNOSIS — I4891 Unspecified atrial fibrillation: Secondary | ICD-10-CM | POA: Diagnosis not present

## 2024-03-20 DIAGNOSIS — E43 Unspecified severe protein-calorie malnutrition: Secondary | ICD-10-CM | POA: Insufficient documentation

## 2024-03-20 DIAGNOSIS — A419 Sepsis, unspecified organism: Secondary | ICD-10-CM | POA: Diagnosis not present

## 2024-03-20 LAB — RENAL FUNCTION PANEL
Albumin: 2.2 g/dL — ABNORMAL LOW (ref 3.5–5.0)
Anion gap: 8 (ref 5–15)
BUN: 16 mg/dL (ref 8–23)
CO2: 26 mmol/L (ref 22–32)
Calcium: 7.7 mg/dL — ABNORMAL LOW (ref 8.9–10.3)
Chloride: 102 mmol/L (ref 98–111)
Creatinine, Ser: 0.69 mg/dL (ref 0.61–1.24)
GFR, Estimated: >60 mL/min (ref >60)
Glucose, Bld: 112 mg/dL — ABNORMAL HIGH (ref 70–99)
Phosphorus: 3 mg/dL (ref 2.5–4.6)
Potassium: 4 mmol/L (ref 3.5–5.1)
Sodium: 136 mmol/L (ref 135–145)

## 2024-03-20 LAB — CBC
HCT: 22.5 % — ABNORMAL LOW (ref 39.0–52.0)
Hemoglobin: 7.7 g/dL — ABNORMAL LOW (ref 13.0–17.0)
MCH: 29.6 pg (ref 26.0–34.0)
MCHC: 34.2 g/dL (ref 30.0–36.0)
MCV: 86.5 fL (ref 80.0–100.0)
Platelets: 494 10*3/uL — ABNORMAL HIGH (ref 150–400)
RBC: 2.6 MIL/uL — ABNORMAL LOW (ref 4.22–5.81)
RDW: 16.1 % — ABNORMAL HIGH (ref 11.5–15.5)
WBC: 8.6 10*3/uL (ref 4.0–10.5)
nRBC: 0 % (ref 0.0–0.2)

## 2024-03-20 LAB — GLUCOSE, CAPILLARY
Glucose-Capillary: 114 mg/dL — ABNORMAL HIGH (ref 70–99)
Glucose-Capillary: 213 mg/dL — ABNORMAL HIGH (ref 70–99)
Glucose-Capillary: 208 mg/dL — ABNORMAL HIGH (ref 70–99)
Glucose-Capillary: 173 mg/dL — ABNORMAL HIGH (ref 70–99)

## 2024-03-20 LAB — MAGNESIUM: Magnesium: 1.9 mg/dL (ref 1.7–2.4)

## 2024-03-20 LAB — ABO/RH: ABO/RH(D): B POS

## 2024-03-20 LAB — PREPARE RBC (CROSSMATCH)

## 2024-03-20 MED ORDER — SODIUM CHLORIDE 0.9% IV SOLUTION: Freq: Once | INTRAVENOUS | Status: AC

## 2024-03-20 MED ORDER — FOOD THICKENER (SIMPLYTHICK HONEY)
10.0000 | ORAL | Status: DC | PRN
  Filled 2024-03-20: qty 10

## 2024-03-20 NOTE — Progress Notes (Signed)
 [name]                                                                               Earl [name], is a 83 y.o. male, DOB - 29-Jan-1941, VOZ:[id number] Admit date - 03/04/2024    Outpatient Primary MD for the patient is [name], MD  LOS - 16  days    Brief summary    83 year old male with history of paroxysmal A-fib, insulin -dependent diabetes, hypertension, hyperlipidemia, CVA with residual right-sided weakness, MGUS, GERD, BPH, hypothyroidism, HFrEF with EF of 40 to 45%, chronic pancytopenia who presented to the emergency department from SNF with complaint of shortness of breath, wound, pain in the sacral area.  He was just discharged from [facility] to [facility] rehab 4 days ago.  Hospital course was remarkable for acute metabolic encephalopathy,  orthostatic hypotension, rhabdomyolysis, hypoglycemia.  On presentation, he was hypotensive, EKG showed A-fib with RVR, tachypnea but saturating fine on room air.  Respiratory panel negative for COVID.  PCCM consulted for persistent hypotension.  Cardiology also consulted.  General surgery consulted for debridement of sacral ulcer.    Status post bedside excisional debridement of the sacral wound on 4/15 and 4/17.  Wound care, cardiology, general surgery following.  No plan for further operative intervention for sacral ulcer.  Now hemodynamically stable.Prolonged hospitalization.  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 Seen by PCCM and started on broad-spectrum IV antibiotics. Patient is close to complete the empiric antibiotics with IV ceftriaxone and Flagyl . General surgery has been on board for the sacral ulcer, underwent bedside excisional debridement of the sacral wound on 4/15 and 4/17. CT of the  abdomen pelvis shows a sacral decubitus wound with extension in the right gluteus muscle with no signs of osteomyelitis. ID consulted and recommended to continue antibiotics through 4/30 with wound care and nutritional support     Chronic sacral wound with suspicion for infection  See above note   History of paroxysmal atrial fibrillation  Cardiology consulted, currently on amiodarone  and on Eliquis  for anticoagulation  History of HFrEF/transthyretin cardiac amyloidosis/NICM/hypotension  Entresto  and Toprol  on hold for hypotension Outpatient follow-up with cardiology  Left lower lobe pneumonia  Empirically on IV antibiotics . Repeat chest x-ray tomorrow   Insulin -dependent diabetes mellitus type 2  Last A1c is 8.4 Continue with Semglee  and sliding scale insulin   GERD    Anemia of chronic disease  Will transfuse 1 unit of PRBC for hemoglobin less than 8 Continue to monitor  Hypothyroidism Continue Synthroid   Severe protein calorie malnutrition/suspicion for dysphagia Nutritionist and speech therapy on board Continue with nutritional supplementation Patient currently on pured diet   Elderly deconditioned gentleman with recurrent hospitalizations, continue to decline has multiple medical issues.  Palliative care consulted and patient is currently a DNR Therapy evaluations have been ordered recommended SNF  RN Pressure Injury Documentation: Pressure Injury 02/26/24 Coccyx Mid Unstageable - Full thickness tissue loss in which the base of the injury  is covered by slough (yellow, tan, gray, green or brown) and/or eschar (tan, brown or black) in the wound bed. (Active)  02/26/24 0916  Location: Coccyx  Location Orientation: Mid  Staging: Unstageable - Full thickness tissue loss in which the base of the injury is covered by slough (yellow, tan, gray, green or brown) and/or eschar (tan, brown or black) in the wound bed.  Wound Description (Comments):   Present on Admission:  No  Dressing Type Foam - Lift dressing to assess site every shift 03/19/24 0800     Pressure Injury 02/26/24 Toe (Comment  which one) Anterior;Right Deep Tissue Pressure Injury - Purple or maroon localized area of discolored intact skin or blood-filled blister due to damage of underlying soft tissue from pressure and/or shear. right gre (Active)  02/26/24 1215  Location: Toe (Comment  which one)  Location Orientation: Anterior;Right  Staging: Deep Tissue Pressure Injury - Purple or maroon localized area of discolored intact skin or blood-filled blister due to damage of underlying soft tissue from pressure and/or shear.  Wound Description (Comments): right great toe  Present on Admission:   Dressing Type Foam - Lift dressing to assess site every shift 03/19/24 0800     Pressure Injury 03/06/24 Hip Anterior;Left;Proximal Deep Tissue Pressure Injury - Purple or maroon localized area of discolored intact skin or blood-filled blister due to damage of underlying soft tissue from pressure and/or shear. Fluid filled blister w (Active)  03/06/24 1612  Location: Hip  Location Orientation: Anterior;Left;Proximal  Staging: Deep Tissue Pressure Injury - Purple or maroon localized area of discolored intact skin or blood-filled blister due to damage of underlying soft tissue from pressure and/or shear.  Wound Description (Comments): Fluid filled blister with purple discoloration  Present on Admission: No  Dressing Type Foam - Lift dressing to assess site every shift 03/19/24 0800     Pressure Injury 03/06/24 Shoulder Left Deep Tissue Pressure Injury - Purple or maroon localized area of discolored intact skin or blood-filled blister due to damage of underlying soft tissue from pressure and/or shear. dark purple discoloration to should (Active)  03/06/24 1613  Location: Shoulder  Location Orientation: Left  Staging: Deep Tissue Pressure Injury - Purple or maroon localized area of discolored intact skin or  blood-filled blister due to damage of underlying soft tissue from pressure and/or shear.  Wound Description (Comments): dark purple discoloration to shoulder  Present on Admission: No  Dressing Type Foam - Lift dressing to assess site every shift 03/19/24 0800    Malnutrition Type:  Nutrition Problem: Severe Malnutrition Etiology: chronic illness   Malnutrition Characteristics:  Signs/Symptoms: energy intake < or equal to 75% for > or equal to 1 month, severe muscle depletion   Nutrition Interventions:  Interventions: Ensure Enlive (each supplement provides 350kcal and 20 grams of protein), Magic cup, Education  Estimated body mass index is 23.15 kg/m as calculated from the following:   Height as of this encounter: 5\' 11"  (1.803 m).   Weight as of this encounter: 75.3 kg.  Code Status: DNR DVT Prophylaxis:  SCDs Start: 03/04/24 2151 Place TED hose Start: 03/04/24 2151 apixaban  (ELIQUIS ) tablet 5 mg   Level of Care: Level of care: Telemetry Medical Family Communication: Updated patient's daughter at bedside  Disposition Plan:     Remains inpatient appropriate: Pending clinical improvement  Procedures:  Bedside debridement of the sacral decubitus ulcer  Consultants:   General surgery.  Infectious disease PCCM Wound care nurse Cardiology Palliative care  Antimicrobials:   Anti-infectives (  From admission, onward)    Start     Dose/Rate Route Frequency Ordered Stop   03/16/24 1015  cefTRIAXone (ROCEPHIN) 2 g in sodium chloride  0.9 % 100 mL IVPB        2 g 200 mL/hr over 30 Minutes Intravenous Every 24 hours 03/16/24 0929 03/20/24 1219   03/16/24 1015  doxycycline (VIBRA-TABS) tablet 100 mg        100 mg Oral Every 12 hours 03/16/24 0929 03/21/24 0959   03/14/24 1000  metroNIDAZOLE  (FLAGYL ) tablet 500 mg        500 mg Oral Every 12 hours 03/14/24 0803 03/19/24 2219   03/13/24 1000  cefadroxil  (DURICEF) capsule 1,000 mg  Status:  Discontinued        1,000 mg Oral  2 times daily 03/12/24 1632 03/16/24 0929   03/07/24 2200  metroNIDAZOLE  (FLAGYL ) tablet 500 mg        500 mg Oral Every 12 hours 03/07/24 0840 03/11/24 2258   03/05/24 1930  vancomycin  (VANCOREADY) IVPB 1250 mg/250 mL  Status:  Discontinued        1,250 mg 166.7 mL/hr over 90 Minutes Intravenous Every 24 hours 03/04/24 2205 03/04/24 2224   03/05/24 1930  Vancomycin  (VANCOCIN ) 1,250 mg in sodium chloride  0.9 % 250 mL IVPB  Status:  Discontinued        1,250 mg 166.7 mL/hr over 90 Minutes Intravenous Every 24 hours 03/04/24 2224 03/05/24 0717   03/05/24 1930  vancomycin  (VANCOREADY) IVPB 1500 mg/300 mL  Status:  Discontinued        1,500 mg 150 mL/hr over 120 Minutes Intravenous Every 24 hours 03/05/24 0717 03/12/24 1636   03/05/24 1400  ceFEPIme  (MAXIPIME ) 2 g in sodium chloride  0.9 % 100 mL IVPB  Status:  Discontinued        2 g 200 mL/hr over 30 Minutes Intravenous Every 8 hours 03/05/24 0717 03/12/24 1632   03/05/24 0600  ceFEPIme  (MAXIPIME ) 2 g in sodium chloride  0.9 % 100 mL IVPB  Status:  Discontinued        2 g 200 mL/hr over 30 Minutes Intravenous Every 12 hours 03/04/24 2205 03/05/24 0717   03/05/24 0000  metroNIDAZOLE  (FLAGYL ) IVPB 500 mg  Status:  Discontinued        500 mg 100 mL/hr over 60 Minutes Intravenous Every 12 hours 03/04/24 2206 03/04/24 2209   03/04/24 1730  aztreonam (AZACTAM) 2 g in sodium chloride  0.9 % 100 mL IVPB  Status:  Discontinued        2 g 200 mL/hr over 30 Minutes Intravenous  Once 03/04/24 1715 03/04/24 1724   03/04/24 1730  metroNIDAZOLE  (FLAGYL ) IVPB 500 mg  Status:  Discontinued        500 mg 100 mL/hr over 60 Minutes Intravenous Every 12 hours 03/04/24 1715 03/07/24 0840   03/04/24 1730  vancomycin  (VANCOREADY) IVPB 1500 mg/300 mL        1,500 mg 150 mL/hr over 120 Minutes Intravenous  Once 03/04/24 1715 03/04/24 2153   03/04/24 1730  ceFEPIme  (MAXIPIME ) 2 g in sodium chloride  0.9 % 100 mL IVPB        2 g 200 mL/hr over 30 Minutes Intravenous   Once 03/04/24 1724 03/04/24 1826        Medications  Scheduled Meds:  acetaminophen   500 mg Oral TID   amiodarone   200 mg Oral Daily   apixaban   5 mg Oral BID   ascorbic acid   500 mg Oral BID  atorvastatin   40 mg Oral Daily   Chlorhexidine  Gluconate Cloth  6 each Topical Daily   collagenase    Topical Daily   doxycycline  100 mg Oral Q12H   feeding supplement  237 mL Oral TID BM   ferrous sulfate   325 mg Oral QHS   furosemide   20 mg Oral Daily   guaiFENesin   600 mg Oral BID   insulin  aspart  0-15 Units Subcutaneous TID WC   insulin  aspart  0-5 Units Subcutaneous QHS   insulin  aspart  3 Units Subcutaneous TID WC   insulin  glargine-yfgn  15 Units Subcutaneous Daily   levothyroxine   50 mcg Oral Q0600   liver oil-zinc  oxide   Topical BID   midodrine   5 mg Oral TID WC   nutrition supplement (JUVEN)  1 packet Oral BID BM   oxyCODONE   10 mg Oral Q12H   phosphorus  500 mg Oral TID   sodium chloride  flush  3 mL Intravenous Q12H   Tafamidis   1 capsule Oral Daily   Continuous Infusions: PRN Meds:.acetaminophen  **OR** acetaminophen , lip balm, morphine injection, ondansetron  **OR** ondansetron  (ZOFRAN ) IV, oxyCODONE , polyethylene glycol, senna-docusate, sodium chloride  flush    Subjective:   Earl [name] was seen and examined today.  He is alert and oriented to person and answering simple questions.   Objective:   Vitals:   03/20/24 0437 03/20/24 0822 03/20/24 1211 03/20/24 1545  BP: 129/61 132/67 130/67 131/63  Pulse: 91 92 (!) 102 97  Resp: 18 19 18 19   Temp: 98.8 F (37.1 C) 98.1 F (36.7 C) 98.8 F (37.1 C) 98.9 F (37.2 C)  TempSrc: Oral  Oral Oral  SpO2: 100% 97% 97% 98%  Weight:      Height:        Intake/Output Summary (Last 24 hours) at 03/20/2024 1548 Last data filed at 03/20/2024 1422 Gross per 24 hour  Intake 225.67 ml  Output 1500 ml  Net -1274.33 ml   Filed Weights   03/04/24 1656 03/15/24 0500  Weight: 68.5 kg 75.3 kg     Exam General  exam: ill appearing elderly gentleman,  Respiratory system: Clear to auscultation. Respiratory effort normal. Cardiovascular system: S1 & S2 heard, RRR. No JVD, Gastrointestinal system: Abdomen is nondistended, soft and nontender.  Central nervous system: Alert and oriented to person, answering simple questions.  Extremities: warm extremities Skin:  Psychiatry:  mood is appropriate.    Data Reviewed:  I have personally reviewed following labs and imaging studies   CBC Lab Results  Component Value Date   WBC 8.6 03/20/2024   RBC 2.60 (L) 03/20/2024   HGB 7.7 (L) 03/20/2024   HCT 22.5 (L) 03/20/2024   MCV 86.5 03/20/2024   MCH 29.6 03/20/2024   PLT 494 (H) 03/20/2024   MCHC 34.2 03/20/2024   RDW 16.1 (H) 03/20/2024   LYMPHSABS 1.5 03/18/2024   MONOABS 1.0 03/18/2024   EOSABS 0.2 03/18/2024   BASOSABS 0.1 03/18/2024     Last metabolic panel Lab Results  Component Value Date   NA 136 03/20/2024   K 4.0 03/20/2024   CL 102 03/20/2024   CO2 26 03/20/2024   BUN 16 03/20/2024   CREATININE 0.69 03/20/2024   GLUCOSE 112 (H) 03/20/2024   GFRNONAA >60 03/20/2024   GFRAA 77 05/17/2021   CALCIUM  7.7 (L) 03/20/2024   PHOS 3.0 03/20/2024   PROT 4.7 (L) 03/05/2024   ALBUMIN  2.2 (L) 03/20/2024   LABGLOB 2.4 02/22/2024   BILITOT 0.7 03/05/2024   ALKPHOS  72 03/05/2024   AST 41 03/05/2024   ALT 31 03/05/2024   ANIONGAP 8 03/20/2024    CBG (last 3)  Recent Labs    03/19/24 2207 03/20/24 0823 03/20/24 1202  GLUCAP 174* 114* 213*      Coagulation Profile: No results for input(s): "INR", "PROTIME" in the last 168 hours.   Radiology Studies: No results found.     Feliciana Horn M.D. [name] 03/20/2024, 3:48 PM  Available via Epic secure chat 7am-7pm After 7 pm, please refer to night coverage provider listed on amion.

## 2024-03-20 NOTE — Progress Notes (Signed)
 Physical Therapy Treatment Patient Details Name: Earl [name] MRN: [id number] DOB: [birth year] Today's Date: 03/20/2024   History of Present Illness 83 y.o. male brought to the ED 4/14 from home by EMS with SoB and sacral pain. Found to have acute metabolic encephalopathy, hypotensive, hypoglycemic. 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    PT Comments  Pt received in supine, agreeable to therapy session after RN premedicated him for pain and pt with good participation and tolerance for seated balance activity and BUE strengthening exercises. Pt anxious and guarding due to pain but with increased time and encouragement on less painful technique, pt with good effort for log roll to EOB and sat EOB >10 mins while working on activities listed above. Patient will benefit from continued inpatient follow up therapy, <3 hours/day, but if pt/family instead decide to DC home, recommend max HH services and recommend pt/family look into getting private [facility] aides for assist with his care. PTA discussed pt progress with supervising PT to focus goals more on mobility given good progress thus far; plan to bring HEP handout for UE strengthening and pressure relief education next session in case pt discharges in the next few days.     If plan is discharge home, recommend the following: Two people to help with walking and/or transfers;A lot of help with bathing/dressing/bathroom;Assistance with cooking/housework;Assistance with feeding;Direct supervision/assist for medications management;Direct supervision/assist for financial management;Assist for transportation;Help with stairs or ramp for entrance   Can travel by private vehicle     No  Equipment Recommendations  Hospital bed;Hoyer lift  (air bed due to pressure sore)    Recommendations for Other Services       Precautions / Restrictions Precautions Precautions: Fall Recall of Precautions/Restrictions: Intact Restrictions Weight Bearing Restrictions Per Provider Order: No     Mobility  Bed Mobility Overal bed mobility: Needs Assistance Bed Mobility: Rolling, Sidelying to Sit, Sit to Sidelying Rolling: Max assist, +2 for safety/equipment, Used rails Sidelying to sit: Max assist, +2 for safety/equipment, HOB elevated, Used rails     Sit to sidelying: Max assist, +2 for physical assistance, Used rails General bed mobility comments: Log roll to R EOB with increased time and simple 1-step multimodal cues    Transfers Overall transfer level: Needs assistance                 General transfer comment: PTA offered to assist him to trial STS however pt reports he is too fatigued; poor seated balance demonstrated today; Cues for one lateral squat/scoot toward HOB prior to return to supine however pt unable to achieve propulsion and would need TotalA, defer additional attempts due to his sacral wound.    Ambulation/Gait                   Stairs             Wheelchair Mobility     Tilt Bed    Modified Rankin (Stroke Patients Only)       Balance Overall balance assessment: Needs assistance Sitting-balance support: Feet supported Sitting balance-Leahy Scale: Poor Sitting balance - Comments: Variable modA to totalA trunk support required, tendency to R lean toward his weaker side and to offload sacral pain; worked on anterior lean with UE reaching anterior and laterally as well as cross-body to try to  improve midline posture Postural control: Posterior lean, Right lateral lean     Standing balance comment: pt reports he is too fatigued and demos poor seated balance so unlikely to be able to even if he was less fatigued                            Communication  Communication Communication: Impaired Factors Affecting Communication: Difficulty expressing self  Cognition Arousal: Alert Behavior During Therapy: WFL for tasks assessed/performed   PT - Cognitive impairments: Problem solving, Memory, Initiation, Sequencing                       PT - Cognition Comments: slowed processing, impaired memory; pt attempting to follow commands as able, some anticipatory pain Following commands: Impaired Following commands impaired: Follows one step commands with increased time, Follows multi-step commands inconsistently    Cueing Cueing Techniques: Verbal cues, Visual cues, Gestural cues, Tactile cues  Exercises General Exercises - Lower Extremity Long Arc Quad: AAROM, Both, 5 reps, Seated Other Exercises Other Exercises: Bil shouler AA/PROM x10 reps ea Other Exercises: seated BUE D1/D2 diagonal PNF patterns with resistance within tolerance Other Exercises: Dynamic reaching BUEs with emphasis on midline posture to prevent R and posterior LOB    General Comments General comments (skin integrity, edema, etc.): seated BP 139/78 taken on L forearm HR 99 bpm; HR 99 bpm; supine BP 132/61 (81) HR 100 bpm      Pertinent Vitals/Pain Pain Assessment Pain Assessment: Faces Faces Pain Scale: Hurts even more Pain Location: "all over" however pt seems to have more pain at site of sacral wound and with BLE AAROM. Pain Descriptors / Indicators: Grimacing, Discomfort, Tightness, Moaning, Guarding Pain Intervention(s): Limited activity within patient's tolerance, Monitored during session, Premedicated before session, Repositioned    Home Living                          Prior Function            PT Goals (current goals can now be found in the care plan section) Acute Rehab PT Goals Patient Stated Goal: To improve strength and mobility to reduce risk for falls. PT Goal Formulation: With patient/family Time For Goal Achievement:  03/25/24 Progress towards PT goals: Progressing toward goals    Frequency    Min 1X/week      PT Plan      Co-evaluation              AM-PAC PT "6 Clicks" Mobility   Outcome Measure  Help needed turning from your back to your side while in a flat bed without using bedrails?: Total Help needed moving from lying on your back to sitting on the side of a flat bed without using bedrails?: Total (+2 assist for all aspects) Help needed moving to and from a bed to a chair (including a wheelchair)?: Total Help needed standing up from a chair using your arms (e.g., wheelchair or bedside chair)?: Total Help needed to walk in hospital room?: Total Help needed climbing 3-5 steps with a railing? : Total 6 Click Score: 6    End of Session Equipment Utilized During Treatment: Other (comment) (bed pad assist) Activity Tolerance: Patient tolerated treatment well;Patient limited by pain Patient left: in bed;with call bell/phone within reach;with bed alarm set;with family/visitor present;Other (comment) (pillow under his L lower back/hip to offload pressure from his sacrum, Prevalon  boots donned to protect his heels, pillow between his knees for comfort/pressure relief) Nurse Communication: Mobility status;Need for lift equipment;Patient requests pain meds PT Visit Diagnosis: Muscle weakness (generalized) (M62.81);Adult, failure to thrive (R62.7);Difficulty in walking, not elsewhere classified (R26.2) Pain - part of body:  (sacrum)     Time: 1610-9604 PT Time Calculation (min) (ACUTE ONLY): 34 min  Charges:    $Therapeutic Exercise: 8-22 mins $Therapeutic Activity: 8-22 mins PT General Charges $$ ACUTE PT VISIT: 1 Visit                     Shamell Suarez P., PTA Acute Rehabilitation Services Secure Chat Preferred 9a-5:30pm Office: [phone]    Arville Laughter 03/20/2024, 2:33 PM

## 2024-03-20 NOTE — TOC Progression Note (Signed)
 Transition of Care [facility]) - Progression Note    Patient Details  Name: Earl [name] MRN: [id number] Date of Birth: 1941-08-03  [facility] of [facility] CM/SW Contact  Dane Dung, RN Phone Number: 03/20/2024, 4:14 PM  Clinical Narrative:    CM and MSW with [facility] Team spoke with Dr. Mammie Sears concerning patient's plan of care.  TOC team will continue to follow the patient for [facility] needs as patient progresses.   Expected Discharge Plan: Skilled Nursing Facility Barriers to Discharge: Continued Medical Work up  Expected Discharge Plan and Services In-house Referral: Clinical Social Work     Living arrangements for the past 2 months:  (from [facility] short term rehab)                                       Social Determinants of Health (SDOH) Interventions SDOH Screenings   Food Insecurity: No Food Insecurity (03/05/2024)  Housing: Low Risk  (03/05/2024)  Transportation Needs: No Transportation Needs (03/05/2024)  Utilities: Not At Risk (03/05/2024)  Depression (PHQ2-9): Low Risk  (10/03/2023)  Financial Resource Strain: Patient Declined (01/16/2024)  Physical Activity: Unknown (01/16/2024)  Social Connections: Moderately Integrated (03/05/2024)  Stress: Stress Concern Present (01/16/2024)  Tobacco Use: Low Risk  (03/04/2024)    Readmission Risk Interventions    03/19/2024   10:24 AM  Readmission Risk Prevention Plan  Transportation Screening Complete  Medication Review (RN Care Manager) Complete  PCP or Specialist appointment within 3-5 days of discharge Complete  HRI or Home Care Consult Complete  SW Recovery Care/Counseling Consult Complete  Palliative Care Screening Complete  Skilled Nursing Facility Complete

## 2024-03-20 NOTE — Progress Notes (Signed)
 Nutrition Follow-up  DOCUMENTATION CODES:   Severe malnutrition in context of chronic illness  INTERVENTION:  -1 packet Juven BID, each packet provides 95 calories, 2.5 grams of protein (collagen), and 9.8 grams of carbohydrate (3 grams sugar); also contains 7 grams of L-arginine and L-glutamine, 300 mg vitamin C , 15 mg vitamin E, 1.2 mcg vitamin B-12, 9.5 mg zinc , 200 mg calcium , and 1.5 g  Calcium  Beta-hydroxy-Beta-methylbutyrate to support wound healing   - Magic Cup TID with meals, each supplement provides 290 kcal and 9 grams of protein   - Mighty Shake TID with meals, each supplement provides 330 kcals and 9 grams of protein   - Ensure Enlive po TID, each supplement provides 350 kcal and 20 grams of protein.   - Zinc  sulfate 220 mg daily x 14 days to support wound healing   - Vitamin C  500 mg BID x 30 days to support wound healing  NUTRITION DIAGNOSIS:   Severe Malnutrition related to chronic illness as evidenced by energy intake < or equal to 75% for > or equal to 1 month, severe muscle depletion. *still applicable  GOAL:   Patient will meet greater than or equal to 90% of their needs *unmet  MONITOR:   PO intake, Supplement acceptance, Skin  REASON FOR ASSESSMENT:   Consult Assessment of nutrition requirement/status  ASSESSMENT:   83 year old male who presented to the ED on 03/04/24 from [facility] due to sacral pain. PMH of T2DM, HTN, HLD, CHF, atrial fibrillation, stroke with right-sided weakness, GERD, BPH, hypothyroidism, MGUS, transthyretin associated amyloid cardiomyopathy, chronic pancytopenia, recent hospital admission for COVID-19. Pt admitted with sepsis secondary to sacral pressure injury infection.  RD met with pt's daughter at bedside. Daughter reports [facility] being out of stock over last week. Daughter concerned about pt's declining nutritional status and decreased po intake. SLP recommended switching to pureed texture yesterday and honey thick  liquids when pt is less alert. Palliative recommending pain meds for breakthrough pain and that pt would not benefit from PEG tube. Provided patient' daughter with substitute options for supplements including instant carnation breakfast with whole milk. Plan for d/c to SNF.   NUTRITION - FOCUSED PHYSICAL EXAM:  Flowsheet Row Most Recent Value  Orbital Region Moderate depletion  Upper Arm Region Moderate depletion  Thoracic and Lumbar Region Mild depletion  Buccal Region Mild depletion  Temple Region Moderate depletion  Clavicle Bone Region Severe depletion  Clavicle and Acromion Bone Region Severe depletion  Scapular Bone Region Severe depletion  Dorsal Hand Mild depletion  Patellar Region Moderate depletion  Anterior Thigh Region Mild depletion  Posterior Calf Region Moderate depletion  Edema (RD Assessment) Mild  Hair Reviewed  Eyes Reviewed  Mouth Reviewed  Skin Reviewed  Nails Reviewed        Diet Order:   Diet Order             DIET - DYS 1 Room service appropriate? Yes; Fluid consistency: Thin  Diet effective now                   EDUCATION NEEDS:   Education needs have been addressed  Skin:  Skin Assessment: Skin Integrity Issues: Skin Integrity Issues:: DTI, Unstageable, Other (Comment) DTI: L hip, L shoulder, R great toe Unstageable: mid coccyx Other: skin tear L elbow, MASD bilateral sacrum  Last BM:  4/29  Height:   Ht Readings from Last 1 Encounters:  03/04/24 5\' 11"  (1.803 m)    Weight:  Wt Readings from Last 1 Encounters:  03/15/24 75.3 kg    BMI:  Body mass index is 23.15 kg/m.  Estimated Nutritional Needs:   Kcal:  2000-2200  Protein:  95-100 grams  Fluid:  >2.0 L  Laren Player, MPH, RD, LDN Clinical Dietitian Contact information can be found at [facility].

## 2024-03-20 NOTE — Progress Notes (Signed)
 Speech Language Pathology Treatment: Dysphagia  Patient Details Name: Earl [name] MRN: [id number] DOB: 09/15/1941 Today's Date: 03/20/2024 Time: 1650-1710 SLP Time Calculation (min) (ACUTE ONLY): 20 min  Assessment / Plan / Recommendation Clinical Impression  Patient seen by SLP for skilled treatment focused on dysphagia goals, including education of his daughter regarding thickening drinks. SLP demonstrated the IDDSI flow test for liquids and provided her with printed instructions. She told SLP that she is interested in making some pre-thickened drinks to have on hand and she feels that honey thick liquids will be best for when patient is more fatigued and/or not able to be in an ideal position in bed. SLP in agreement with this. Patient himself was awake, alert and in good spirits. His voice sounded stronger and more clear today. SLP observed him with a few straw sips of thin liquids with no overt s/s aspiration observed. SLP will continue to follow. Per daughter, patient may be discharging to SNF end of this week.   HPI HPI: Patient is an 83 y.o. 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 He was discharged from the hospital (admitted for acute metabolic encephalopathy, hypotensive, hypoglycemic) four days ago to [facility] for rehab. He presented back to the hospital on 03/04/24 with SOB and pain in sacral area. 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 SLP ordered due to suspicious for dysphagia.      SLP Plan  Continue with current plan of care      Recommendations for follow up therapy are one component of a multi-disciplinary discharge planning process, led by the attending physician.  Recommendations may be updated based on patient status,  additional functional criteria and insurance authorization.    Recommendations  Diet recommendations: Dysphagia 1 (puree);Thin liquid Liquids provided via: Cup;Straw Medication Administration: Crushed with puree Supervision: Trained caregiver to feed patient Compensations: Slow rate;Small sips/bites;Use straw to facilitate chin tuck Postural Changes and/or Swallow Maneuvers: Seated upright 90 degrees;Upright 30-60 min after meal                  Oral care BID   Frequent or constant Supervision/Assistance Dysphagia, unspecified (R13.10)     Continue with current plan of care     Jacqualine Mater, MA, CCC-SLP Speech Therapy

## 2024-03-20 NOTE — Plan of Care (Signed)
 Assumed care at 1900. Pt has expressed pain overnight. Pt has been turned and repositioned. Pt tolerated all meds.   Problem: Fluid Volume: Goal: Hemodynamic stability will improve Outcome: Progressing   Problem: Clinical Measurements: Goal: Diagnostic test results will improve Outcome: Progressing Goal: Signs and symptoms of infection will decrease Outcome: Progressing   Problem: Respiratory: Goal: Ability to maintain adequate ventilation will improve Outcome: Progressing   Problem: Education: Goal: Knowledge of risk factors and measures for prevention of condition will improve Outcome: Progressing   Problem: Coping: Goal: Psychosocial and spiritual needs will be supported Outcome: Progressing   Problem: Respiratory: Goal: Will maintain a patent airway Outcome: Progressing Goal: Complications related to the disease process, condition or treatment will be avoided or minimized Outcome: Progressing   Problem: Education: Goal: Ability to describe self-care measures that may prevent or decrease complications (Diabetes Survival Skills Education) will improve Outcome: Progressing Goal: Individualized Educational Video(s) Outcome: Progressing   Problem: Coping: Goal: Ability to adjust to condition or change in health will improve Outcome: Progressing   Problem: Fluid Volume: Goal: Ability to maintain a balanced intake and output will improve Outcome: Progressing   Problem: Health Behavior/Discharge Planning: Goal: Ability to identify and utilize available resources and services will improve Outcome: Progressing Goal: Ability to manage health-related needs will improve Outcome: Progressing   Problem: Metabolic: Goal: Ability to maintain appropriate glucose levels will improve Outcome: Progressing   Problem: Nutritional: Goal: Maintenance of adequate nutrition will improve Outcome: Progressing Goal: Progress toward achieving an optimal weight will improve Outcome:  Progressing   Problem: Skin Integrity: Goal: Risk for impaired skin integrity will decrease Outcome: Progressing   Problem: Tissue Perfusion: Goal: Adequacy of tissue perfusion will improve Outcome: Progressing   Problem: Education: Goal: Knowledge of General Education information will improve Description: Including pain rating scale, medication(s)/side effects and non-pharmacologic comfort measures Outcome: Progressing   Problem: Health Behavior/Discharge Planning: Goal: Ability to manage health-related needs will improve Outcome: Progressing   Problem: Clinical Measurements: Goal: Ability to maintain clinical measurements within normal limits will improve Outcome: Progressing Goal: Will remain free from infection Outcome: Progressing Goal: Diagnostic test results will improve Outcome: Progressing Goal: Respiratory complications will improve Outcome: Progressing Goal: Cardiovascular complication will be avoided Outcome: Progressing   Problem: Activity: Goal: Risk for activity intolerance will decrease Outcome: Progressing   Problem: Nutrition: Goal: Adequate nutrition will be maintained Outcome: Progressing   Problem: Coping: Goal: Level of anxiety will decrease Outcome: Progressing   Problem: Elimination: Goal: Will not experience complications related to bowel motility Outcome: Progressing Goal: Will not experience complications related to urinary retention Outcome: Progressing   Problem: Pain Managment: Goal: General experience of comfort will improve and/or be controlled Outcome: Progressing   Problem: Safety: Goal: Ability to remain free from injury will improve Outcome: Progressing   Problem: Skin Integrity: Goal: Risk for impaired skin integrity will decrease Outcome: Progressing

## 2024-03-21 ENCOUNTER — Inpatient Hospital Stay (HOSPITAL_COMMUNITY)

## 2024-03-21 DIAGNOSIS — I5022 Chronic systolic (congestive) heart failure: Secondary | ICD-10-CM | POA: Diagnosis not present

## 2024-03-21 DIAGNOSIS — E43 Unspecified severe protein-calorie malnutrition: Secondary | ICD-10-CM | POA: Diagnosis not present

## 2024-03-21 DIAGNOSIS — A419 Sepsis, unspecified organism: Secondary | ICD-10-CM | POA: Diagnosis not present

## 2024-03-21 DIAGNOSIS — I480 Paroxysmal atrial fibrillation: Secondary | ICD-10-CM | POA: Diagnosis not present

## 2024-03-21 LAB — TYPE AND SCREEN
ABO/RH(D): B POS
Antibody Screen: NEGATIVE
Unit division: 0

## 2024-03-21 LAB — GLUCOSE, CAPILLARY
Glucose-Capillary: 133 mg/dL — ABNORMAL HIGH (ref 70–99)
Glucose-Capillary: 230 mg/dL — ABNORMAL HIGH (ref 70–99)
Glucose-Capillary: 218 mg/dL — ABNORMAL HIGH (ref 70–99)
Glucose-Capillary: 151 mg/dL — ABNORMAL HIGH (ref 70–99)

## 2024-03-21 LAB — CBC WITH DIFFERENTIAL/PLATELET
Abs Immature Granulocytes: 0.08 10*3/uL — ABNORMAL HIGH (ref 0.00–0.07)
Basophils Absolute: 0.1 10*3/uL (ref 0.0–0.1)
Basophils Relative: 1 %
Eosinophils Absolute: 0.2 10*3/uL (ref 0.0–0.5)
Eosinophils Relative: 2 %
HCT: 28.9 % — ABNORMAL LOW (ref 39.0–52.0)
Hemoglobin: 9.5 g/dL — ABNORMAL LOW (ref 13.0–17.0)
Immature Granulocytes: 1 %
Lymphocytes Relative: 15 %
Lymphs Abs: 1.6 10*3/uL (ref 0.7–4.0)
MCH: 28.3 pg (ref 26.0–34.0)
MCHC: 32.9 g/dL (ref 30.0–36.0)
MCV: 86 fL (ref 80.0–100.0)
Monocytes Absolute: 0.9 10*3/uL (ref 0.1–1.0)
Monocytes Relative: 9 %
Neutro Abs: 8.2 10*3/uL — ABNORMAL HIGH (ref 1.7–7.7)
Neutrophils Relative %: 72 %
Platelets: 489 10*3/uL — ABNORMAL HIGH (ref 150–400)
RBC: 3.36 MIL/uL — ABNORMAL LOW (ref 4.22–5.81)
RDW: 15.8 % — ABNORMAL HIGH (ref 11.5–15.5)
WBC: 11.1 10*3/uL — ABNORMAL HIGH (ref 4.0–10.5)
nRBC: 0 % (ref 0.0–0.2)

## 2024-03-21 LAB — BASIC METABOLIC PANEL WITH GFR
Anion gap: 8 (ref 5–15)
BUN: 16 mg/dL (ref 8–23)
CO2: 25 mmol/L (ref 22–32)
Calcium: 8 mg/dL — ABNORMAL LOW (ref 8.9–10.3)
Chloride: 105 mmol/L (ref 98–111)
Creatinine, Ser: 0.74 mg/dL (ref 0.61–1.24)
GFR, Estimated: >60 mL/min (ref >60)
Glucose, Bld: 172 mg/dL — ABNORMAL HIGH (ref 70–99)
Potassium: 4.1 mmol/L (ref 3.5–5.1)
Sodium: 138 mmol/L (ref 135–145)

## 2024-03-21 LAB — BPAM RBC
Blood Product Expiration Date: 202505272359
ISSUE DATE / TIME: 202504302253
Unit Type and Rh: 7300

## 2024-03-21 MED ORDER — DICLOFENAC SODIUM 1 % EX GEL
4.0000 g | Freq: Four times a day (QID) | CUTANEOUS | Status: DC
  Administered 2024-03-21 – 2024-08-21 (×574): 4 g via TOPICAL
  Filled 2024-03-21 (×19): qty 100

## 2024-03-21 MED ORDER — INSULIN ASPART 100 UNIT/ML IJ SOLN
4.0000 [IU] | Freq: Three times a day (TID) | INTRAMUSCULAR | Status: DC
  Administered 2024-03-22 – 2024-03-26 (×14): 4 [IU] via SUBCUTANEOUS

## 2024-03-21 NOTE — Progress Notes (Signed)
 [name]                                                                               Earl [name], is a 83 y.o. male, DOB - [birth year], OZH:[id number] Admit date - 03/04/2024    Outpatient Primary MD for the patient is [name], MD  LOS - 17  days    Brief summary    83 year old male with history of paroxysmal A-fib, insulin -dependent diabetes, hypertension, hyperlipidemia, CVA with residual right-sided weakness, MGUS, GERD, BPH, hypothyroidism, HFrEF with EF of 40 to 45%, chronic pancytopenia who presented to the emergency department from SNF with complaint of shortness of breath, wound, pain in the sacral area.  He was just discharged from [facility] to [facility] 4 days ago.  Hospital course was remarkable for acute metabolic encephalopathy,  orthostatic hypotension, rhabdomyolysis, hypoglycemia.  On presentation, he was hypotensive, EKG showed A-fib with RVR, tachypnea but saturating fine on room air.  Respiratory panel negative for COVID.  PCCM consulted for persistent hypotension.  Cardiology also consulted.  General surgery consulted for debridement of sacral ulcer.    Status post bedside excisional debridement of the sacral wound on 4/15 and 4/17.  Wound care, cardiology, general surgery following.  No plan for further operative intervention for sacral ulcer.  Now hemodynamically stable.Prolonged hospitalization.  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 CT of the abdomen pelvis shows a sacral decubitus wound with extension in the right gluteus muscle with no signs of osteomyelitis. Seen by PCCM and started on broad-spectrum IV antibiotics, completed the course by 03/21/24. General surgery has been on board for the sacral ulcer, underwent  bedside excisional debridement of the sacral wound on 4/15 and 4/17.  General surgery reconsulted today and recommended the wound does not need further debridement. He will need routine wound care .ID consulted and recommended to continue antibiotics through 4/30 with wound care and nutritional support. Sepsis physiology has resolved.    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 Pt has intermittent bouts of afib. Remains asymptomatic.   History of HFrEF/transthyretin cardiac amyloidosis/NICM/hypotension  Entresto  and Toprol  on hold for hypotension. Continue with low dose lasix  as BP allows.  Outpatient follow-up with cardiology  Left lower lobe pneumonia  Completed the course of antibiotics.  Repeat CXR show CHF. No consolidation.   Insulin -dependent diabetes mellitus type 2  Last A1c is 8.4 Continue with Semglee  and sliding scale insulin . CBG (last 3)  Recent Labs    03/21/24 0837 03/21/24 1224 03/21/24 1619  GLUCAP 133* 230* 218*   Cbgs improving.   GERD  Stable.    Anemia of chronic disease  S/p 1 unit of prbc transfusion.  Repeat hemoglobin around 9.   Hypothyroidism Continue Synthroid   Severe protein calorie malnutrition/suspicion for dysphagia Nutritionist and speech therapy on board Continue with nutritional  supplementation Patient currently on pured diet and albumin  appears to be improving.    Elderly deconditioned gentleman with recurrent hospitalizations, continues to decline has multiple medical issues.  Palliative care consulted and patient is currently a DNR Therapy evaluations have been ordered recommended SNF  RN Pressure Injury Documentation: Pressure Injury 02/26/24 Coccyx Mid Unstageable - Full thickness tissue loss in which the base of the injury is covered by slough (yellow, tan, gray, green  or brown) and/or eschar (tan, brown or black) in the wound bed. (Active)  02/26/24 0916  Location: Coccyx  Location Orientation: Mid  Staging: Unstageable - Full thickness tissue loss in which the base of the injury is covered by slough (yellow, tan, gray, green or brown) and/or eschar (tan, brown or black) in the wound bed.  Wound Description (Comments):   Present on Admission: No  Dressing Type Silver hydrofiber;Foam - Lift dressing to assess site every shift 03/21/24 0902     Pressure Injury 02/26/24 Toe (Comment  which one) Anterior;Right Deep Tissue Pressure Injury - Purple or maroon localized area of discolored intact skin or blood-filled blister due to damage of underlying soft tissue from pressure and/or shear. right gre (Active)  02/26/24 1215  Location: Toe (Comment  which one)  Location Orientation: Anterior;Right  Staging: Deep Tissue Pressure Injury - Purple or maroon localized area of discolored intact skin or blood-filled blister due to damage of underlying soft tissue from pressure and/or shear.  Wound Description (Comments): right great toe  Present on Admission:   Dressing Type Foam - Lift dressing to assess site every shift 03/19/24 0800     Pressure Injury 03/06/24 Hip Anterior;Left;Proximal Deep Tissue Pressure Injury - Purple or maroon localized area of discolored intact skin or blood-filled blister due to damage of underlying soft tissue from pressure and/or shear. Fluid filled blister w (Active)  03/06/24 1612  Location: Hip  Location Orientation: Anterior;Left;Proximal  Staging: Deep Tissue Pressure Injury - Purple or maroon localized area of discolored intact skin or blood-filled blister due to damage of underlying soft tissue from pressure and/or shear.  Wound Description (Comments): Fluid filled blister with purple discoloration  Present on Admission: No  Dressing Type Foam - Lift dressing to assess site every shift 03/21/24 0902     Pressure Injury 03/06/24  Shoulder Left Deep Tissue Pressure Injury - Purple or maroon localized area of discolored intact skin or blood-filled blister due to damage of underlying soft tissue from pressure and/or shear. dark purple discoloration to should (Active)  03/06/24 1613  Location: Shoulder  Location Orientation: Left  Staging: Deep Tissue Pressure Injury - Purple or maroon localized area of discolored intact skin or blood-filled blister due to damage of underlying soft tissue from pressure and/or shear.  Wound Description (Comments): dark purple discoloration to shoulder  Present on Admission: No  Dressing Type Foam - Lift dressing to assess site every shift 03/19/24 0800   Wound care consulted and recommendations given.   Malnutrition Type:  Nutrition Problem: Severe Malnutrition Etiology: chronic illness   Malnutrition Characteristics:  Signs/Symptoms: energy intake < or equal to 75% for > or equal to 1 month, severe muscle depletion   Nutrition Interventions:  Interventions: Ensure Enlive (each supplement provides 350kcal and 20 grams of protein), Magic cup, Education  Estimated body mass index is 23.15 kg/m as calculated from the following:   Height as of this encounter: 5\' 11"  (1.803 m).   Weight as of this encounter: 75.3 kg.  Code Status: DNR DVT Prophylaxis:  SCDs Start: 03/04/24 2151 Place TED hose Start: 03/04/24 2151 apixaban  (ELIQUIS ) tablet 5 mg   Level of Care: Level of care: Telemetry Medical Family Communication: Updated patient's daughter at bedside  Disposition Plan:     Remains inpatient appropriate: Pending clinical improvement  Procedures:  Bedside debridement of the sacral decubitus ulcer  Consultants:   General surgery.  Infectious disease PCCM Wound care nurse Cardiology Palliative care  Antimicrobials:   Anti-infectives (From admission, onward)    Start     Dose/Rate Route Frequency Ordered Stop   03/16/24 1015  cefTRIAXone  (ROCEPHIN ) 2 g in sodium  chloride 0.9 % 100 mL IVPB        2 g 200 mL/hr over 30 Minutes Intravenous Every 24 hours 03/16/24 0929 03/20/24 1219   03/16/24 1015  doxycycline  (VIBRA -TABS) tablet 100 mg        100 mg Oral Every 12 hours 03/16/24 0929 03/20/24 2131   03/14/24 1000  metroNIDAZOLE  (FLAGYL ) tablet 500 mg        500 mg Oral Every 12 hours 03/14/24 0803 03/19/24 2219   03/13/24 1000  cefadroxil  (DURICEF) capsule 1,000 mg  Status:  Discontinued        1,000 mg Oral 2 times daily 03/12/24 1632 03/16/24 0929   03/07/24 2200  metroNIDAZOLE  (FLAGYL ) tablet 500 mg        500 mg Oral Every 12 hours 03/07/24 0840 03/11/24 2258   03/05/24 1930  vancomycin  (VANCOREADY) IVPB 1250 mg/250 mL  Status:  Discontinued        1,250 mg 166.7 mL/hr over 90 Minutes Intravenous Every 24 hours 03/04/24 2205 03/04/24 2224   03/05/24 1930  Vancomycin  (VANCOCIN ) 1,250 mg in sodium chloride  0.9 % 250 mL IVPB  Status:  Discontinued        1,250 mg 166.7 mL/hr over 90 Minutes Intravenous Every 24 hours 03/04/24 2224 03/05/24 0717   03/05/24 1930  vancomycin  (VANCOREADY) IVPB 1500 mg/300 mL  Status:  Discontinued        1,500 mg 150 mL/hr over 120 Minutes Intravenous Every 24 hours 03/05/24 0717 03/12/24 1636   03/05/24 1400  ceFEPIme  (MAXIPIME ) 2 g in sodium chloride  0.9 % 100 mL IVPB  Status:  Discontinued        2 g 200 mL/hr over 30 Minutes Intravenous Every 8 hours 03/05/24 0717 03/12/24 1632   03/05/24 0600  ceFEPIme  (MAXIPIME ) 2 g in sodium chloride  0.9 % 100 mL IVPB  Status:  Discontinued        2 g 200 mL/hr over 30 Minutes Intravenous Every 12 hours 03/04/24 2205 03/05/24 0717   03/05/24 0000  metroNIDAZOLE  (FLAGYL ) IVPB 500 mg  Status:  Discontinued        500 mg 100 mL/hr over 60 Minutes Intravenous Every 12 hours 03/04/24 2206 03/04/24 2209   03/04/24 1730  aztreonam (AZACTAM) 2 g in sodium chloride  0.9 % 100 mL IVPB  Status:  Discontinued        2 g 200 mL/hr over 30 Minutes Intravenous  Once 03/04/24 1715 03/04/24  1724   03/04/24 1730  metroNIDAZOLE  (FLAGYL ) IVPB 500 mg  Status:  Discontinued        500 mg 100 mL/hr over 60 Minutes Intravenous Every 12 hours 03/04/24 1715 03/07/24 0840   03/04/24 1730  vancomycin  (VANCOREADY) IVPB 1500 mg/300 mL        1,500 mg 150 mL/hr over 120 Minutes Intravenous  Once 03/04/24 1715 03/04/24 2153   03/04/24 1730  ceFEPIme  (  MAXIPIME ) 2 g in sodium chloride  0.9 % 100 mL IVPB        2 g 200 mL/hr over 30 Minutes Intravenous  Once 03/04/24 1724 03/04/24 1826        Medications  Scheduled Meds:  acetaminophen   500 mg Oral TID   amiodarone   200 mg Oral Daily   apixaban   5 mg Oral BID   ascorbic acid   500 mg Oral BID   atorvastatin   40 mg Oral Daily   Chlorhexidine  Gluconate Cloth  6 each Topical Daily   diclofenac  Sodium  4 g Topical QID   feeding supplement  237 mL Oral TID BM   ferrous sulfate   325 mg Oral QHS   furosemide   20 mg Oral Daily   guaiFENesin   600 mg Oral BID   insulin  aspart  0-15 Units Subcutaneous TID WC   insulin  aspart  0-5 Units Subcutaneous QHS   insulin  aspart  3 Units Subcutaneous TID WC   insulin  glargine-yfgn  15 Units Subcutaneous Daily   levothyroxine   50 mcg Oral Q0600   liver oil-zinc  oxide   Topical BID   midodrine   5 mg Oral TID WC   nutrition supplement (JUVEN)  1 packet Oral BID BM   oxyCODONE   10 mg Oral Q12H   phosphorus  500 mg Oral TID   sodium chloride  flush  3 mL Intravenous Q12H   Tafamidis   1 capsule Oral Daily   Continuous Infusions: PRN Meds:.acetaminophen  **OR** acetaminophen , food thickener, lip balm, morphine  injection, ondansetron  **OR** ondansetron  (ZOFRAN ) IV, oxyCODONE , polyethylene glycol, senna-docusate, sodium chloride  flush    Subjective:   Earl [name] was seen and examined today.  No new events overnight.   Objective:   Vitals:   03/20/24 2312 03/21/24 0011 03/21/24 0118 03/21/24 0835  BP: 138/77 135/72 127/76 119/61  Pulse: (!) 102 93 92 92  Resp: 20 18 18 16   Temp: 98.9 F  (37.2 C) 98.4 F (36.9 C) 98.3 F (36.8 C) 98.8 F (37.1 C)  TempSrc: Oral Oral Oral   SpO2: 98% 99% 99% 98%  Weight:      Height:        Intake/Output Summary (Last 24 hours) at 03/21/2024 1353 Last data filed at 03/21/2024 0902 Gross per 24 hour  Intake 302.83 ml  Output 3000 ml  Net -2697.17 ml   Filed Weights   03/04/24 1656 03/15/24 0500  Weight: 68.5 kg 75.3 kg     Exam General exam: Appears calm and comfortable  Respiratory system: Clear to auscultation. Respiratory effort normal. Cardiovascular system: S1 & S2 heard, RRR. No JVD, Gastrointestinal system: Abdomen is nondistended, soft and nontender.  Central nervous system: Alert and oriented. No focal neurological deficits. Extremities: Symmetric 5 x 5 power. Skin: unstageable sacral decubitus.  Psychiatry: mood is appropriate.    Data Reviewed:  I have personally reviewed following labs and imaging studies   CBC Lab Results  Component Value Date   WBC 11.1 (H) 03/21/2024   RBC 3.36 (L) 03/21/2024   HGB 9.5 (L) 03/21/2024   HCT 28.9 (L) 03/21/2024   MCV 86.0 03/21/2024   MCH 28.3 03/21/2024   PLT 489 (H) 03/21/2024   MCHC 32.9 03/21/2024   RDW 15.8 (H) 03/21/2024   LYMPHSABS 1.6 03/21/2024   MONOABS 0.9 03/21/2024   EOSABS 0.2 03/21/2024   BASOSABS 0.1 03/21/2024     Last metabolic panel Lab Results  Component Value Date   NA 138 03/21/2024   K 4.1 03/21/2024  CL 105 03/21/2024   CO2 25 03/21/2024   BUN 16 03/21/2024   CREATININE 0.74 03/21/2024   GLUCOSE 172 (H) 03/21/2024   GFRNONAA >60 03/21/2024   GFRAA 77 05/17/2021   CALCIUM  8.0 (L) 03/21/2024   PHOS 3.0 03/20/2024   PROT 4.7 (L) 03/05/2024   ALBUMIN  2.2 (L) 03/20/2024   LABGLOB 2.4 02/22/2024   BILITOT 0.7 03/05/2024   ALKPHOS 72 03/05/2024   AST 41 03/05/2024   ALT 31 03/05/2024   ANIONGAP 8 03/21/2024    CBG (last 3)  Recent Labs    03/20/24 2004 03/21/24 0837 03/21/24 1224  GLUCAP 173* 133* 230*      Coagulation  Profile: No results for input(s): "INR", "PROTIME" in the last 168 hours.   Radiology Studies: DG CHEST PORT 1 VIEW Result Date: 03/21/2024 CLINICAL DATA:  Sepsis EXAM: PORTABLE CHEST 1 VIEW COMPARISON:  03/15/2024 FINDINGS: Cardiac shadow is enlarged but stable. Increasing central vascular congestion is noted. Small left effusion is seen. No bony abnormality is noted. IMPRESSION: Persistent congestive failure with small left effusion. Electronically Signed   By: Violeta Grey M.D.   On: 03/21/2024 09:22       Feliciana Horn M.D. [name] 03/21/2024, 1:53 PM  Available via Epic secure chat 7am-7pm After 7 pm, please refer to night coverage provider listed on amion.

## 2024-03-21 NOTE — Progress Notes (Signed)
 Physical Therapy Treatment Patient Details Name: Earl [name] MRN: [id number] DOB: 10/20/1941 Today's Date: 03/21/2024   History of Present Illness 83 y.o. male brought to the ED 4/14 from home by EMS with SoB and sacral pain. Found to have acute metabolic encephalopathy, hypotensive, hypoglycemic. 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    PT Comments  Pt received in supine, agreeable to therapy session and with good participation and fair tolerance for bed mobility, instruction on supine UE/LE HEP for benefits of ROM/strengthening and pressure relief instruction. Pt c/o moderate to severe pain despite premedication with IV pain meds by RN prior to session and participates as able. Handout provided for carryover for pt/family on PROM/AAROM and pressure relief technique/frequency. Patient will benefit from continued inpatient follow up therapy, <3 hours/day.    If plan is discharge home, recommend the following: Two people to help with walking and/or transfers;A lot of help with bathing/dressing/bathroom;Assistance with cooking/housework;Assistance with feeding;Direct supervision/assist for medications management;Direct supervision/assist for financial management;Assist for transportation;Help with stairs or ramp for entrance   Can travel by private vehicle     No  Equipment Recommendations  Hospital bed;Hoyer lift (air bed due to pressure sore)    Recommendations for Other Services       Precautions / Restrictions Precautions Precautions: Fall Recall of Precautions/Restrictions: Intact Precaution/Restrictions Comments: significant pain from pressure sore Restrictions Weight Bearing Restrictions Per Provider Order: No     Mobility  Bed Mobility Overal bed  mobility: Needs Assistance Bed Mobility: Rolling Rolling: Max assist, +2 for safety/equipment, Used rails         General bed mobility comments: Rolling to L and R EOB with cues for cross-body reaching and BLE placement; good effort with multimodal cues given; increased time to perform to each side; pillows moved to offload pressure from R to L side; NT in room for pt safety while he rolled but not needing to physically assist PTA to roll him.     Transfers Overall transfer level: Needs assistance                 General transfer comment: Pt too pain limited after rolling to L/R sides and no +2 assist available at time of session    Ambulation/Gait                   Stairs             Wheelchair Mobility     Tilt Bed    Modified Rankin (Stroke Patients Only)       Balance Overall balance assessment: Needs assistance Sitting-balance support: Feet supported Sitting balance-Leahy Scale: Poor Sitting balance - Comments: Pt defers due to pain; no +2 assist available at time of session       Standing balance comment: not safe to attempt this date                            Communication Communication Communication: Impaired Factors Affecting Communication: Difficulty expressing self  Cognition Arousal: Alert Behavior During Therapy: WFL for tasks assessed/performed   PT - Cognitive impairments: Problem solving, Memory, Initiation, Sequencing                       PT - Cognition Comments:  slowed processing, impaired memory; pt attempting to follow commands as able, some anticipatory pain and difficulty switching between tasks due to perseveration on pain. Following commands: Impaired Following commands impaired: Follows one step commands with increased time, Follows multi-step commands inconsistently    Cueing Cueing Techniques: Verbal cues, Visual cues, Gestural cues, Tactile cues  Exercises Other Exercises Other Exercises:  BUE AAROM: shoulder/elbow flex/ext x10 reps ea Other Exercises: supine BLE AAROM: hip/knee flex/ext x10 reps ea Other Exercises: HEP handout brought to room with info on pressure relief technique/frequency and caregiver AA/PROM, no family in room to instruct on during session so left handout on pt's tray table HEP link: Visit  [email or web address]  Access Code: [email or web address]    General Comments        Pertinent Vitals/Pain Pain Assessment Pain Assessment: Faces Faces Pain Scale: Hurts whole lot Pain Location: "all over" however pt seems to have more pain at site of sacral wound and with BLE AAROM. Pain Descriptors / Indicators: Grimacing, Discomfort, Tightness, Moaning, Guarding Pain Intervention(s): Limited activity within patient's tolerance, Monitored during session, Premedicated before session, Repositioned, Utilized relaxation techniques, Other (comment) (pillows moved for pressure relief; pt had IV meds for pain ~15-20 mins prior to session)    Home Living                          Prior Function            PT Goals (current goals can now be found in the care plan section) Acute Rehab PT Goals Patient Stated Goal: To improve strength and mobility to reduce risk for falls. PT Goal Formulation: With patient/family Time For Goal Achievement: 03/25/24 Progress towards PT goals: Progressing toward goals    Frequency    Min 1X/week      PT Plan      Co-evaluation              AM-PAC PT "6 Clicks" Mobility   Outcome Measure  Help needed turning from your back to your side while in a flat bed without using bedrails?: Total (w/o rails) Help needed moving from lying on your back to sitting on the side of a flat bed without using bedrails?: Total (+2 assist for all aspects) Help needed moving to and from a bed to a chair (including a wheelchair)?: Total Help needed standing up from a chair using your arms (e.g., wheelchair or bedside chair)?:  Total Help needed to walk in hospital room?: Total Help needed climbing 3-5 steps with a railing? : Total 6 Click Score: 6    End of Session Equipment Utilized During Treatment: Other (comment) (bed pad assist) Activity Tolerance: Patient limited by pain;Patient limited by fatigue Patient left: in bed;with call bell/phone within reach;with bed alarm set;Other (comment) (pillow under his L lower back/hip to offload pressure from his sacrum, Prevalon boots donned to protect his heels, pillow between his knees for comfort/pressure relief; semi-sidelying toward his R side) Nurse Communication: Mobility status;Need for lift equipment PT Visit Diagnosis: Muscle weakness (generalized) (M62.81);Adult, failure to thrive (R62.7);Difficulty in walking, not elsewhere classified (R26.2) Pain - part of body:  (sacrum)     Time: 5409-8119 PT Time Calculation (min) (ACUTE ONLY): 13 min  Charges:    $Therapeutic Exercise: 8-22 mins PT General Charges $$ ACUTE PT VISIT: 1 Visit                     Justine Dines P., PTA Acute Rehabilitation Services  Secure Chat Preferred 9a-5:30pm Office: [phone]    Earl [name] [facility] 03/21/2024, 5:47 PM

## 2024-03-21 NOTE — Progress Notes (Signed)
 Progress Note     Subjective: Ask to reassess wound.  Objective: Vital signs in last 24 hours: Temp:  [98.3 F (36.8 C)-99.4 F (37.4 C)] 98.8 F (37.1 C) (05/01 0835) Pulse Rate:  [92-102] 92 (05/01 0835) Resp:  [16-20] 16 (05/01 0835) BP: (119-138)/(61-77) 119/61 (05/01 0835) SpO2:  [97 %-100 %] 98 % (05/01 0835) Last BM Date : 03/20/24  Intake/Output from previous day: 04/30 0701 - 05/01 0700 In: 302.8 [I.V.:19.5; Blood:283.3] Out: 2800 [Urine:2800] Intake/Output this shift: No intake/output data recorded.  PE: Skin:  wound is clean with no infection.  Slight tunneling superior, but this is also clean   Lab Results:  Recent Labs    03/19/24 0435 03/20/24 0624  WBC 9.9 8.6  HGB 8.5* 7.7*  HCT 25.0* 22.5*  PLT 529* 494*   BMET Recent Labs    03/19/24 0435 03/20/24 0624  NA 137 136  K 4.2 4.0  CL 103 102  CO2 26 26  GLUCOSE 144* 112*  BUN 17 16  CREATININE 0.65 0.69  CALCIUM  7.5* 7.7*   PT/INR No results for input(s): "LABPROT", "INR" in the last 72 hours.  CMP     Component Value Date/Time   NA 136 03/20/2024 0624   NA 141 08/23/2023 1541   NA 140 10/25/2017 0753   K 4.0 03/20/2024 0624   K 4.2 10/25/2017 0753   CL 102 03/20/2024 0624   CL 101 10/23/2012 0802   CO2 26 03/20/2024 0624   CO2 21 (L) 10/25/2017 0753   GLUCOSE 112 (H) 03/20/2024 0624   GLUCOSE 160 (H) 10/25/2017 0753   GLUCOSE 149 (H) 10/23/2012 0802   BUN 16 03/20/2024 0624   BUN 23 08/23/2023 1541   BUN 13.1 10/25/2017 0753   CREATININE 0.69 03/20/2024 0624   CREATININE 1.00 10/31/2023 1601   CREATININE 1.1 10/25/2017 0753   CALCIUM  7.7 (L) 03/20/2024 0624   CALCIUM  9.1 10/25/2017 0753   PROT 4.7 (L) 03/05/2024 0446   PROT 6.0 (L) 10/25/2017 0753   PROT 5.8 (L) 10/25/2017 0752   ALBUMIN  2.2 (L) 03/20/2024 0624   ALBUMIN  3.5 10/25/2017 0753   AST 41 03/05/2024 0446   AST 20 12/22/2021 1013   AST 17 10/25/2017 0753   ALT 31 03/05/2024 0446   ALT 19 12/22/2021  1013   ALT 19 10/25/2017 0753   ALKPHOS 72 03/05/2024 0446   ALKPHOS 85 10/25/2017 0753   BILITOT 0.7 03/05/2024 0446   BILITOT 0.6 12/22/2021 1013   BILITOT 0.48 10/25/2017 0753   GFRNONAA >60 03/20/2024 0624   GFRNONAA >60 12/22/2021 1013   GFRNONAA 67 05/17/2021 1208   GFRAA 77 05/17/2021 1208   Lipase     Component Value Date/Time   LIPASE 40 01/16/2020 1119       Studies/Results: No results found.   Anti-infectives: Anti-infectives (From admission, onward)    Start     Dose/Rate Route Frequency Ordered Stop   03/16/24 1015  cefTRIAXone  (ROCEPHIN ) 2 g in sodium chloride  0.9 % 100 mL IVPB        2 g 200 mL/hr over 30 Minutes Intravenous Every 24 hours 03/16/24 0929 03/20/24 1219   03/16/24 1015  doxycycline  (VIBRA -TABS) tablet 100 mg        100 mg Oral Every 12 hours 03/16/24 0929 03/20/24 2131   03/14/24 1000  metroNIDAZOLE  (FLAGYL ) tablet 500 mg        500 mg Oral Every 12 hours 03/14/24 0803 03/19/24 2219   03/13/24 1000  cefadroxil  (DURICEF) capsule 1,000 mg  Status:  Discontinued        1,000 mg Oral 2 times daily 03/12/24 1632 03/16/24 0929   03/07/24 2200  metroNIDAZOLE  (FLAGYL ) tablet 500 mg        500 mg Oral Every 12 hours 03/07/24 0840 03/11/24 2258   03/05/24 1930  vancomycin  (VANCOREADY) IVPB 1250 mg/250 mL  Status:  Discontinued        1,250 mg 166.7 mL/hr over 90 Minutes Intravenous Every 24 hours 03/04/24 2205 03/04/24 2224   03/05/24 1930  Vancomycin  (VANCOCIN ) 1,250 mg in sodium chloride  0.9 % 250 mL IVPB  Status:  Discontinued        1,250 mg 166.7 mL/hr over 90 Minutes Intravenous Every 24 hours 03/04/24 2224 03/05/24 0717   03/05/24 1930  vancomycin  (VANCOREADY) IVPB 1500 mg/300 mL  Status:  Discontinued        1,500 mg 150 mL/hr over 120 Minutes Intravenous Every 24 hours 03/05/24 0717 03/12/24 1636   03/05/24 1400  ceFEPIme  (MAXIPIME ) 2 g in sodium chloride  0.9 % 100 mL IVPB  Status:  Discontinued        2 g 200 mL/hr over 30 Minutes  Intravenous Every 8 hours 03/05/24 0717 03/12/24 1632   03/05/24 0600  ceFEPIme  (MAXIPIME ) 2 g in sodium chloride  0.9 % 100 mL IVPB  Status:  Discontinued        2 g 200 mL/hr over 30 Minutes Intravenous Every 12 hours 03/04/24 2205 03/05/24 0717   03/05/24 0000  metroNIDAZOLE  (FLAGYL ) IVPB 500 mg  Status:  Discontinued        500 mg 100 mL/hr over 60 Minutes Intravenous Every 12 hours 03/04/24 2206 03/04/24 2209   03/04/24 1730  aztreonam (AZACTAM) 2 g in sodium chloride  0.9 % 100 mL IVPB  Status:  Discontinued        2 g 200 mL/hr over 30 Minutes Intravenous  Once 03/04/24 1715 03/04/24 1724   03/04/24 1730  metroNIDAZOLE  (FLAGYL ) IVPB 500 mg  Status:  Discontinued        500 mg 100 mL/hr over 60 Minutes Intravenous Every 12 hours 03/04/24 1715 03/07/24 0840   03/04/24 1730  vancomycin  (VANCOREADY) IVPB 1500 mg/300 mL        1,500 mg 150 mL/hr over 120 Minutes Intravenous  Once 03/04/24 1715 03/04/24 2153   03/04/24 1730  ceFEPIme  (MAXIPIME ) 2 g in sodium chloride  0.9 % 100 mL IVPB        2 g 200 mL/hr over 30 Minutes Intravenous  Once 03/04/24 1724 03/04/24 1826        Assessment/Plan  Sacral decubitus ulcer, stage 4 - wound is clean with no needs for debridement. -discussed with daughter at the bedside -continue routine wound care per wound service.  We will sign off.  Relayed to primary service.  FEN: reg diet, IVF per TRH  VTE: Eliquis  ID: no abx needed for wound  - per TRH -  Recent COVID Deconditioned  Paroxysmal A. Fib on eliquis  Bradycardia T2DM HTN HLD Hx of CVA with residual R sided weakness GERD BPH Thyroid  disease Amyloid cardiomyopathy HFrEF Chronic pancytopenia   LOS: 17 days   I reviewed hospitalist notes, last 24 h vitals and pain scores, last 48 h intake and output, last 24 h labs and trends, and last 24 h imaging results.   Leone Ralphs, [facility] 03/21/2024, 9:16 AM Please see Amion for pager number during day hours  7:00am-4:30pm

## 2024-03-21 NOTE — TOC Progression Note (Addendum)
 Transition of Care [facility]) - Progression Note    Patient Details  Name: Earl [name] MRN: [id number] Date of Birth: 1941/01/09  [facility]) CM/SW Contact  [name], LCSW Phone Number: 03/21/2024, 3:28 PM  Clinical Narrative:     [facility] team spoke with pt, pt's son Fawn Hooks and pt's daughter-in-law and Dr. Mammie Sears at bedside to discuss disposition. Pt and family agreeable to plan for short term rehab with options for possible long term care in the future. Discussed and answered questions regarding medications, nutrition and care at a skilled facility.   Pt is from [facility] for rehab, pt and family ok with facility for placement, however stated that facility informed them previously may not be able to have LTC options. CSW to reach out [facility] and find out about option for pt to return.   CSW faxed pt out for other possible placement options at SNF for rehab, additional bed offers pending.   [facility] palliative following pt to facility at discharge.   Plan for DC once authorization is approved and SNF placement secured.   TOC will continue to follow.   Expected Discharge Plan: Skilled Nursing Facility Barriers to Discharge: Continued Medical Work up  Expected Discharge Plan and Services In-house Referral: Clinical Social Work     Living arrangements for the past 2 months:  (from [facility] short term rehab)                                       Social Determinants of Health (SDOH) Interventions SDOH Screenings   Food Insecurity: No Food Insecurity (03/05/2024)  Housing: Low Risk  (03/05/2024)  Transportation Needs: No Transportation Needs (03/05/2024)  Utilities: Not At Risk (03/05/2024)  Depression (PHQ2-9): Low Risk  (10/03/2023)  Financial Resource Strain: Patient Declined (01/16/2024)  Physical Activity: Unknown (01/16/2024)  Social Connections: Moderately Integrated (03/05/2024)  Stress: Stress Concern Present (01/16/2024)  Tobacco Use: Low Risk   (03/04/2024)    Readmission Risk Interventions    03/19/2024   10:24 AM  Readmission Risk Prevention Plan  Transportation Screening Complete  Medication Review (RN Care Manager) Complete  PCP or Specialist appointment within 3-5 days of discharge Complete  HRI or Home Care Consult Complete  SW Recovery Care/Counseling Consult Complete  Palliative Care Screening Complete  Skilled Nursing Facility Complete

## 2024-03-21 NOTE — Consult Note (Addendum)
 WOC Nurse wound follow up Wound type: Multiple wounds. Instruction for dressing in each area. Update orders Visited the pt in person to give all the instructions. Left all the supplies needed in a separate container with stamps for each dressing change and basic instructions for the bed nurse.  1 - Left Hip PI stage 2 Wound bed: 100% red, blister ruptured, partial raised skin. Drainage (amount, consistency, odor) Moderated weeping, no odor, serous. Periwound: Dressing procedure/placement/frequency: Apply Xeroform, cover with foam dressing, change daily.   2 - Left shoulder DTPI Wound bed: 100% dark red, blister ruptured. Drainage (amount, consistency, odor) Minimum weeping, no odor, serous. Periwound: intact Dressing procedure/placement/frequency: Apply Medihoney (change daily) and cover  with foam dressing, change every 3 days or PRN.   3 - Sacral Unstageable PI Wound bed: 60% yellow, 40% beefy red. Tunneling at 12 o'clock position Drainage (amount, consistency, odor) Minimum amount, no odor, serous, no signs of infection Periwound: intacts, white scar tissue surrounding, viable edges. Dressing procedure/placement/frequency: Cleanse with Vashe#[id number]. Apply Aquacel [id number] on the wound bed and fill the tunneling using a swab, cover with sacral foam dressing, change every 3 days or PRN soiling.   4 - Great toe DTPI Measurement: 1 x 1 cm Wound bed: no skin breakdown, black/purple coloration. Drainage (amount, consistency, odor) NONE Periwound: Intact, dry Dressing procedure/placement/frequency: Open air   5 - Right heel Measurement: 2 x 1.2 cm Wound bed: no skin breakdown, black/purple coloration. Drainage (amount, consistency, odor) NONE Periwound: Intact, dry Dressing procedure/placement/frequency: Protect with foam dressing, change every 3 days or PRN.

## 2024-03-21 NOTE — Plan of Care (Signed)
  Problem: Fluid Volume: Goal: Hemodynamic stability will improve Outcome: Progressing   Problem: Clinical Measurements: Goal: Diagnostic test results will improve Outcome: Progressing Goal: Signs and symptoms of infection will decrease Outcome: Progressing   Problem: Respiratory: Goal: Ability to maintain adequate ventilation will improve Outcome: Progressing   Problem: Education: Goal: Knowledge of risk factors and measures for prevention of condition will improve Outcome: Progressing   Problem: Coping: Goal: Psychosocial and spiritual needs will be supported Outcome: Progressing

## 2024-03-21 NOTE — Plan of Care (Signed)
  Problem: Fluid Volume: Goal: Hemodynamic stability will improve Outcome: Progressing   Problem: Clinical Measurements: Goal: Diagnostic test results will improve Outcome: Progressing Goal: Signs and symptoms of infection will decrease Outcome: Progressing   Problem: Respiratory: Goal: Ability to maintain adequate ventilation will improve Outcome: Progressing   Problem: Education: Goal: Knowledge of risk factors and measures for prevention of condition will improve Outcome: Progressing   Problem: Coping: Goal: Psychosocial and spiritual needs will be supported Outcome: Progressing   Problem: Respiratory: Goal: Will maintain a patent airway Outcome: Progressing Goal: Complications related to the disease process, condition or treatment will be avoided or minimized Outcome: Progressing   Problem: Education: Goal: Ability to describe self-care measures that may prevent or decrease complications (Diabetes Survival Skills Education) will improve Outcome: Progressing Goal: Individualized Educational Video(s) Outcome: Progressing   Problem: Coping: Goal: Ability to adjust to condition or change in health will improve Outcome: Progressing   Problem: Fluid Volume: Goal: Ability to maintain a balanced intake and output will improve Outcome: Progressing   Problem: Health Behavior/Discharge Planning: Goal: Ability to identify and utilize available resources and services will improve Outcome: Progressing Goal: Ability to manage health-related needs will improve Outcome: Progressing   Problem: Metabolic: Goal: Ability to maintain appropriate glucose levels will improve Outcome: Progressing   Problem: Nutritional: Goal: Maintenance of adequate nutrition will improve Outcome: Progressing Goal: Progress toward achieving an optimal weight will improve Outcome: Progressing   Problem: Skin Integrity: Goal: Risk for impaired skin integrity will decrease Outcome: Progressing    Problem: Tissue Perfusion: Goal: Adequacy of tissue perfusion will improve Outcome: Progressing   Problem: Education: Goal: Knowledge of General Education information will improve Description: Including pain rating scale, medication(s)/side effects and non-pharmacologic comfort measures Outcome: Progressing   Problem: Health Behavior/Discharge Planning: Goal: Ability to manage health-related needs will improve Outcome: Progressing   Problem: Clinical Measurements: Goal: Ability to maintain clinical measurements within normal limits will improve Outcome: Progressing Goal: Will remain free from infection Outcome: Progressing Goal: Diagnostic test results will improve Outcome: Progressing Goal: Respiratory complications will improve Outcome: Progressing Goal: Cardiovascular complication will be avoided Outcome: Progressing   Problem: Activity: Goal: Risk for activity intolerance will decrease Outcome: Progressing   Problem: Nutrition: Goal: Adequate nutrition will be maintained Outcome: Progressing   Problem: Coping: Goal: Level of anxiety will decrease Outcome: Progressing   Problem: Elimination: Goal: Will not experience complications related to bowel motility Outcome: Progressing Goal: Will not experience complications related to urinary retention Outcome: Progressing   Problem: Pain Managment: Goal: General experience of comfort will improve and/or be controlled Outcome: Progressing   Problem: Safety: Goal: Ability to remain free from injury will improve Outcome: Progressing   Problem: Skin Integrity: Goal: Risk for impaired skin integrity will decrease Outcome: Progressing

## 2024-03-21 DEATH — deceased

## 2024-03-22 DIAGNOSIS — E43 Unspecified severe protein-calorie malnutrition: Secondary | ICD-10-CM | POA: Diagnosis not present

## 2024-03-22 DIAGNOSIS — A419 Sepsis, unspecified organism: Secondary | ICD-10-CM | POA: Diagnosis not present

## 2024-03-22 DIAGNOSIS — I5022 Chronic systolic (congestive) heart failure: Secondary | ICD-10-CM | POA: Diagnosis not present

## 2024-03-22 DIAGNOSIS — I480 Paroxysmal atrial fibrillation: Secondary | ICD-10-CM | POA: Diagnosis not present

## 2024-03-22 LAB — GLUCOSE, CAPILLARY
Glucose-Capillary: 169 mg/dL — ABNORMAL HIGH (ref 70–99)
Glucose-Capillary: 214 mg/dL — ABNORMAL HIGH (ref 70–99)
Glucose-Capillary: 139 mg/dL — ABNORMAL HIGH (ref 70–99)
Glucose-Capillary: 107 mg/dL — ABNORMAL HIGH (ref 70–99)

## 2024-03-22 MED ORDER — FUROSEMIDE 20 MG PO TABS
20.0000 mg | ORAL_TABLET | Freq: Once | ORAL | Status: AC
  Administered 2024-03-22: 20 mg via ORAL
  Filled 2024-03-22: qty 1

## 2024-03-22 NOTE — Plan of Care (Signed)
  Problem: Fluid Volume: Goal: Hemodynamic stability will improve Outcome: Progressing   Problem: Clinical Measurements: Goal: Diagnostic test results will improve Outcome: Progressing Goal: Signs and symptoms of infection will decrease Outcome: Progressing   Problem: Respiratory: Goal: Ability to maintain adequate ventilation will improve Outcome: Progressing   Problem: Education: Goal: Knowledge of risk factors and measures for prevention of condition will improve Outcome: Progressing   Problem: Coping: Goal: Psychosocial and spiritual needs will be supported Outcome: Progressing

## 2024-03-22 NOTE — TOC Progression Note (Signed)
 Transition of Care [facility]) - Progression Note    Patient Details  Name: Earl [name] MRN: [id number] Date of Birth: 1941/02/26  Transition of Care [facility]) CM/SW Contact  Meekah Math A [place], LCSW Phone Number: 03/22/2024, 4:19 PM  Clinical Narrative:     CSW followed up with pt and pt's daughter Earl [name] at bedside regarding short term rehab bed options.   CSW notified her the [facility] declined bed offer.   CSW provided bed offers with medicare.gov ratings.   Earl [name] stated that she wanted to speak with her brother and discuss options and follow up with CSW on decision and stated she may have further questions/concerns regarding disposition planning. CSW stated understanding and would follow up and continue to assist along with [facility] leadership to make an appropriate plan.    CSW provided contact information.    TOC will continue to follow.   Expected Discharge Plan: Skilled Nursing Facility Barriers to Discharge: Continued Medical Work up  Expected Discharge Plan and Services In-house Referral: Clinical Social Work     Living arrangements for the past 2 months:  (from [facility] short term rehab)                                       Social Determinants of Health (SDOH) Interventions SDOH Screenings   Food Insecurity: No Food Insecurity (03/05/2024)  Housing: Low Risk  (03/05/2024)  Transportation Needs: No Transportation Needs (03/05/2024)  Utilities: Not At Risk (03/05/2024)  Depression (PHQ2-9): Low Risk  (10/03/2023)  Financial Resource Strain: Patient Declined (01/16/2024)  Physical Activity: Unknown (01/16/2024)  Social Connections: Moderately Integrated (03/05/2024)  Stress: Stress Concern Present (01/16/2024)  Tobacco Use: Low Risk  (03/04/2024)    Readmission Risk Interventions    03/19/2024   10:24 AM  Readmission Risk Prevention Plan  Transportation Screening Complete  Medication Review (RN Care Manager) Complete  PCP or Specialist appointment within 3-5 days of  discharge Complete  HRI or Home Care Consult Complete  SW Recovery Care/Counseling Consult Complete  Palliative Care Screening Complete  Skilled Nursing Facility Complete

## 2024-03-22 NOTE — Progress Notes (Signed)
 Physical Therapy Wound Treatment and Discharge Patient Details  Name: Earl [name] MRN: [id number] Date of Birth: February 24, 1941  Today's Date: 03/22/2024 Time: 4098-1191 Time Calculation (min): 57 min  Subjective  Subjective Assessment Subjective: Pt and daughter pleasant and agreeable to wound therapy. Patient and Family Stated Goals: Heal wound Date of Onset:  (Unknown) Prior Treatments: Dressing changes, off weighting  Pain Score:  Pt was premedicated with IV pain meds. Continued to report pain, especially with rolling/positioning.   Wound Assessment  Pressure Injury 02/26/24 Coccyx Mid Unstageable - Full thickness tissue loss in which the base of the injury is covered by slough (yellow, tan, gray, green or brown) and/or eschar (tan, brown or black) in the wound bed. (Active)  Wound Image   03/22/24 1457  Dressing Type Silver hydrofiber;Foam - Lift dressing to assess site every shift;Barrier Film (skin prep);Moist to moist 03/22/24 1457  Dressing Clean, Dry, Intact 03/22/24 1457  Dressing Change Frequency Daily 03/22/24 1457  State of Healing Early/partial granulation 03/22/24 1457  Site / Wound Assessment Pink;Yellow;Painful 03/22/24 1457  % Wound base Red or Granulating 70% 03/22/24 1457  % Wound base Yellow/Fibrinous Exudate 10% 03/22/24 1457  % Wound base Black/Eschar 0% 03/22/24 1457  % Wound base Other/Granulation Tissue (Comment) 20% 03/22/24 1457  Peri-wound Assessment Pink 03/22/24 1457  Wound Length (cm) 8 cm 03/22/24 1457  Wound Width (cm) 5.8 cm 03/22/24 1457  Wound Depth (cm) 1.4 cm 03/22/24 1457  Wound Surface Area (cm^2) 46.4 cm^2 03/22/24 1457  Wound Volume (cm^3) 64.96 cm^3 03/22/24 1457  Tunneling (cm) 12:00 - 3 cm, 1:00-2:00 - 6 cm, 3:00 9.8 cm (accessible with q-tip) 03/22/24 1457  Undermining (cm) 0 03/22/24 1457  Margins Unattached edges (unapproximated) 03/22/24 1457  Drainage Amount Minimal 03/22/24 1457  Drainage Description No odor;Serosanguineous  03/22/24 1457  Treatment Irrigation;Other (Comment) 03/22/24 1457      Selective Debridement (non-excisional) Selective Debridement (non-excisional) - Location: Sacrum Selective Debridement (non-excisional) - Tools Used: Forceps, Scalpel, Scissors Selective Debridement (non-excisional) - Tissue Removed: yellow unviable tissue    Wound Assessment and Plan  Wound Therapy - Assess/Plan/Recommendations Wound Therapy - Clinical Statement: Wound bed continues to appear improved. Overall wound is clean however noted areas of "yellow" that are visible in the picture. There is residual slough over what appears to be fibrous sheath over sacral bone. In order to protect the integrity of this tissue, opted not to attempt debridement, and feel the remaining slough will clean up over time without debridement. Also noted an extension of the tunneling at 3:00 up to 9.8 cm - accessible with the q-tip. Packed the tunneling areas with Aquacel as much as possible. Update given to daughter and she visualized the wound with PT as well. As wound is grossly clean, PT wound therapy will sign off at this time. If needs change, please reconsult. Wound Therapy - Functional Problem List: Decreased tolerance for position changes or OOB. Factors Delaying/Impairing Wound Healing: Diabetes Mellitus, Immobility, Infection - systemic/local, Multiple medical problems Hydrotherapy Plan: Debridement, Dressing change, Patient/family education Wound Therapy - Frequency: 2X / week Wound Therapy - Follow Up Recommendations: dressing changes by RN  Wound Therapy Goals- Improve the function of patient's integumentary system by progressing the wound(s) through the phases of wound healing (inflammation - proliferation - remodeling) by: Wound Therapy Goals - Improve the function of patient's integumentary system by progressing the wound(s) through the phases of wound healing by: Decrease Necrotic Tissue to: 20% Decrease Necrotic Tissue -  Progress: Progressing toward goal  Increase Granulation Tissue to: 80% Increase Granulation Tissue - Progress: Progressing toward goal Goals/treatment plan/discharge plan were made with and agreed upon by patient/family: Yes Time For Goal Achievement: 7 days Wound Therapy - Potential for Goals: Good  Goals will be updated until maximal potential achieved or discharge criteria met.  Discharge criteria: when goals achieved, discharge from hospital, MD decision/surgical intervention, no progress towards goals, refusal/missing three consecutive treatments without notification or medical reason.  GP     Charges PT Wound Care Charges $PT Hydrotherapy Dressing: 3 dressings $PT Hydrotherapy Visit: 1 Visit       [name] 03/22/2024, 3:12 PM  Simone Dubois, PT, DPT Acute Rehabilitation Services Secure Chat Preferred Office: [phone]

## 2024-03-22 NOTE — Progress Notes (Signed)
 Patient's daughter had requested somebody from cardiology service to discuss her concerns of reported tachycardia and to discuss overall cardiac conditions.  Telemetry shows sinus rhythm.  Heart rates under 100.  Overall seems to be doing well from a cardiac perspective.  All questions were answered.

## 2024-03-22 NOTE — Plan of Care (Signed)
  Problem: Fluid Volume: Goal: Hemodynamic stability will improve Outcome: Progressing   Problem: Clinical Measurements: Goal: Diagnostic test results will improve Outcome: Progressing Goal: Signs and symptoms of infection will decrease Outcome: Progressing   Problem: Respiratory: Goal: Ability to maintain adequate ventilation will improve Outcome: Progressing   Problem: Education: Goal: Knowledge of risk factors and measures for prevention of condition will improve Outcome: Progressing   Problem: Coping: Goal: Psychosocial and spiritual needs will be supported Outcome: Progressing   Problem: Respiratory: Goal: Will maintain a patent airway Outcome: Progressing Goal: Complications related to the disease process, condition or treatment will be avoided or minimized Outcome: Progressing   Problem: Education: Goal: Ability to describe self-care measures that may prevent or decrease complications (Diabetes Survival Skills Education) will improve Outcome: Progressing Goal: Individualized Educational Video(s) Outcome: Progressing   Problem: Coping: Goal: Ability to adjust to condition or change in health will improve Outcome: Progressing   Problem: Fluid Volume: Goal: Ability to maintain a balanced intake and output will improve Outcome: Progressing   Problem: Health Behavior/Discharge Planning: Goal: Ability to identify and utilize available resources and services will improve Outcome: Progressing Goal: Ability to manage health-related needs will improve Outcome: Progressing   Problem: Metabolic: Goal: Ability to maintain appropriate glucose levels will improve Outcome: Progressing   Problem: Nutritional: Goal: Maintenance of adequate nutrition will improve Outcome: Progressing Goal: Progress toward achieving an optimal weight will improve Outcome: Progressing   Problem: Skin Integrity: Goal: Risk for impaired skin integrity will decrease Outcome: Progressing    Problem: Tissue Perfusion: Goal: Adequacy of tissue perfusion will improve Outcome: Progressing   Problem: Education: Goal: Knowledge of General Education information will improve Description: Including pain rating scale, medication(s)/side effects and non-pharmacologic comfort measures Outcome: Progressing   Problem: Health Behavior/Discharge Planning: Goal: Ability to manage health-related needs will improve Outcome: Progressing   Problem: Clinical Measurements: Goal: Ability to maintain clinical measurements within normal limits will improve Outcome: Progressing Goal: Will remain free from infection Outcome: Progressing Goal: Diagnostic test results will improve Outcome: Progressing Goal: Respiratory complications will improve Outcome: Progressing Goal: Cardiovascular complication will be avoided Outcome: Progressing   Problem: Activity: Goal: Risk for activity intolerance will decrease Outcome: Progressing   Problem: Nutrition: Goal: Adequate nutrition will be maintained Outcome: Progressing   Problem: Coping: Goal: Level of anxiety will decrease Outcome: Progressing   Problem: Elimination: Goal: Will not experience complications related to bowel motility Outcome: Progressing Goal: Will not experience complications related to urinary retention Outcome: Progressing   Problem: Pain Managment: Goal: General experience of comfort will improve and/or be controlled Outcome: Progressing   Problem: Safety: Goal: Ability to remain free from injury will improve Outcome: Progressing   Problem: Skin Integrity: Goal: Risk for impaired skin integrity will decrease Outcome: Progressing

## 2024-03-22 NOTE — Progress Notes (Signed)
 Occupational Therapy Treatment Patient Details Name: Earl [name] MRN: [id number] DOB: February 22, 1941 Today's Date: 03/22/2024   History of present illness 83 y.o. male brought to the ED 4/14 from home by EMS with SoB and sacral pain. Found to have acute metabolic encephalopathy, hypotensive, hypoglycemic. 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   OT comments  This patient seen today for Bil UE hand exercises to work on more mobility in hands due to stiffness from OA and decreased use. Pt given resistive foam blocks for Bil hands and instructed on composite flexion and extension. He needs intermittent VC's for this exercise and family in room can provide this. He will continue to benefit from acute OT with follow from continued inpatient follow up therapy, <3 hours/day'      If plan is discharge home, recommend the following:  A lot of help with walking and/or transfers;A lot of help with bathing/dressing/bathroom;Assistance with cooking/housework;Assist for transportation;Help with stairs or ramp for entrance;Two people to help with walking and/or transfers   Equipment Recommendations   (TBD next venue of care)       Precautions / Restrictions Precautions Precautions: Fall Recall of Precautions/Restrictions: Intact Precaution/Restrictions Comments: significant pain from pressure sore Restrictions Weight Bearing Restrictions Per Provider Order: No                 Extremity/Trunk Assessment Upper Extremity Assessment Upper Extremity Assessment: Right hand dominant;RUE deficits/detail;LUE deficits/detail RUE Deficits / Details: R UE affected from CVA ~6-7 years ago per daughter but pt was able to perform ADLs and reach overhead prior to recent COVID; pt with OA  in hands making them stiff for functional use, left hand more impaired than RUE. RUE Coordination: decreased fine motor;decreased gross motor LUE Deficits / Details: L UE without edema as with previous OT session, but grimacing/soreness during ROM; also see comments on RUE above LUE Coordination: decreased fine motor;decreased gross motor            Vision Baseline Vision/History: 1 Wears glasses Ability to See in Adequate Light: 0 Adequate Patient Visual Report: No change from baseline           Communication Communication Communication: Impaired Factors Affecting Communication: Difficulty expressing self   Cognition Arousal: Alert Behavior During Therapy: WFL for tasks assessed/performed               OT - Cognition Comments: pt slower processing but pleasant and follows directions well.--needs intermient cues for Bil UE composite flexion and extension                 Following commands: Impaired Following commands impaired: Follows one step commands with increased time, Follows multi-step commands inconsistently      Cueing   Cueing Techniques: Verbal cues  Exercises Other Exercises Other Exercises: Focus on session today was to provide hand exercise for composite flexion and extension. Gave pt a green foam block for RUE and a yellow foam block fro LUE. Had pt work on composite flexion and extension with these with insructrons to family in room for patient to do 5 minutes on each hand every hour that he is awake.(family in room to provide S and direction to patient prn)            Pertinent Vitals/ Pain       Pain  Assessment Pain Score: 0-No pain (due to session today only for hands)         Frequency  Min 2X/week        Progress Toward Goals  OT Goals(current goals can now be found in the care plan section)  Progress towards OT goals: Progressing toward goals  Acute Rehab OT Goals Patient Stated Goal: to rehab then home OT Goal Formulation:  With patient/family Time For Goal Achievement: 03/25/24 Potential to Achieve Goals: Fair      AM-PAC OT "6 Clicks" Daily Activity     Outcome Measure   Help from another person eating meals?: A Lot Help from another person taking care of personal grooming?: A Lot Help from another person toileting, which includes using toliet, bedpan, or urinal?: Total Help from another person bathing (including washing, rinsing, drying)?: Total Help from another person to put on and taking off regular upper body clothing?: Total Help from another person to put on and taking off regular lower body clothing?: Total 6 Click Score: 8    End of Session Equipment Utilized During Treatment:  (foam blocks for hands)  OT Visit Diagnosis: Other abnormalities of gait and mobility (R26.89);Muscle weakness (generalized) (M62.81);History of falling (Z91.81)   Activity Tolerance Patient tolerated treatment well   Patient Left in bed;with call bell/phone within reach;with family/visitor present       Time: 1610-9604 OT Time Calculation (min): 14 min  Charges: OT General Charges $OT Visit: 1 Visit OT Treatments $Therapeutic Exercise: 8-22 mins  Merryl Abraham OT Acute Rehabilitation Services Office [phone]    Lenox Raider 03/22/2024, 9:22 PM

## 2024-03-22 NOTE — Progress Notes (Signed)
 Left hip and left shoulder dressing changed

## 2024-03-22 NOTE — Progress Notes (Signed)
 [name]                                                                               Earl [name], is a 83 y.o. male, DOB - October 22, 1941, OZH:[id number] Admit date - 03/04/2024    Outpatient Primary MD for the patient is [name], MD  LOS - 18  days    Brief summary    83 year old male with history of paroxysmal A-fib, insulin -dependent diabetes, hypertension, hyperlipidemia, CVA with residual right-sided weakness, MGUS, GERD, BPH, hypothyroidism, HFrEF with EF of 40 to 45%, chronic pancytopenia who presented to the emergency department from SNF with complaint of shortness of breath, wound, pain in the sacral area.  He was just discharged from [facility] to [place] rehab 4 days ago.  Hospital course was remarkable for acute metabolic encephalopathy,  orthostatic hypotension, rhabdomyolysis, hypoglycemia.  On presentation, he was hypotensive, EKG showed A-fib with RVR, tachypnea but saturating fine on room air.  Respiratory panel negative for COVID.  PCCM consulted for persistent hypotension.  Cardiology also consulted.  General surgery consulted for debridement of sacral ulcer.    Status post bedside excisional debridement of the sacral wound on 4/15 and 4/17.  Wound care, cardiology, general surgery following.  No plan for further operative intervention for sacral ulcer.  Now hemodynamically stable.Prolonged hospitalization.  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 CT of the abdomen pelvis shows a sacral decubitus wound with extension in the right gluteus muscle with no signs of osteomyelitis. Seen by PCCM and started on broad-spectrum IV antibiotics, completed the course by 03/21/24. General surgery has been on board for the sacral ulcer, underwent  bedside excisional debridement of the sacral wound on 4/15 and 4/17.  General surgery reconsulted today and recommended the wound does not need further debridement. He will need routine wound care .ID consulted and recommended to continue antibiotics through 4/30 with wound care and nutritional support. Sepsis physiology has resolved.  Pt remains afebrile.    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 Pt has intermittent bouts of afib. Remains asymptomatic. Bouts of tachycardia probably from pain when changing the position or wound dressings.   History of HFrEF/transthyretin cardiac amyloidosis/NICM/hypotension  Entresto  and Toprol  on hold for hypotension. BP parameters stable on midodrine  5 mg TID. Continue with low dose lasix  as BP allows.  CXR shows chf, no pneumonia. Will plan for extra dose of lasix  today. Monitor urine output.  Outpatient follow-up with cardiology  Left lower lobe pneumonia  Completed the course of antibiotics.  Repeat CXR show CHF. No consolidation. Occasional cough and patient is on Mucinex .   Insulin -dependent diabetes mellitus type 2  Last A1c is 8.4 Continue with Semglee  15 units daily, novolog  4 units tidac and sliding scale insulin . CBG (last 3)  Recent Labs    03/22/24 0838 03/22/24 1202  03/22/24 1710  GLUCAP 169* 214* 139*   Cbgs improving.  No changes in meds. Continue to monitor.   GERD  Stable.    Anemia of chronic disease  S/p 1 unit of prbc transfusion.  Repeat hemoglobin around 9.  Continue with ferrous sulfate .   Hypothyroidism Continue Synthroid   Severe protein calorie malnutrition/suspicion for dysphagia Nutritionist and speech therapy on board Continue with nutritional supplementation Patient currently on pured diet and albumin  appears to be improving.     Thrombocytosis:  Platelet around 489,000. Monitor.    Hyperlipidemia Resume Lipitor 40 mg daily.   Diarrhea earlier this week  and has a rectal tube.  It appears to have slowed down.  Rectal tube can be removed today.    Elderly deconditioned gentleman with recurrent hospitalizations, continues to decline has multiple medical issues.  Palliative care consulted and patient is currently a DNR Therapy evaluations have been ordered recommended SNF Patient is medically stable for discharge. Toc on board and plan for discharge when a bed becomes available.   RN Pressure Injury Documentation: Pressure Injury 02/26/24 Coccyx Mid Unstageable - Full thickness tissue loss in which the base of the injury is covered by slough (yellow, tan, gray, green or brown) and/or eschar (tan, brown or black) in the wound bed. (Active)  02/26/24 0916  Location: Coccyx  Location Orientation: Mid  Staging: Unstageable - Full thickness tissue loss in which the base of the injury is covered by slough (yellow, tan, gray, green or brown) and/or eschar (tan, brown or black) in the wound bed.  Wound Description (Comments):   Present on Admission: No  Dressing Type Silver hydrofiber;Foam - Lift dressing to assess site every shift;Barrier Film (skin prep);Moist to moist 03/22/24 1457     Pressure Injury 02/26/24 Toe (Comment  which one) Anterior;Right Deep Tissue Pressure Injury - Purple or maroon localized area of discolored intact skin or blood-filled blister due to damage of underlying soft tissue from pressure and/or shear. right gre (Active)  02/26/24 1215  Location: Toe (Comment  which one)  Location Orientation: Anterior;Right  Staging: Deep Tissue Pressure Injury - Purple or maroon localized area of discolored intact skin or blood-filled blister due to damage of underlying soft tissue from pressure and/or shear.  Wound Description (Comments): right great toe  Present on Admission:   Dressing Type Foam -  Lift dressing to assess site every shift 03/19/24 0800     Pressure Injury 03/06/24 Hip Anterior;Left;Proximal Deep Tissue Pressure Injury - Purple or maroon localized area of discolored intact skin or blood-filled blister due to damage of underlying soft tissue from pressure and/or shear. Fluid filled blister w (Active)  03/06/24 1612  Location: Hip  Location Orientation: Anterior;Left;Proximal  Staging: Deep Tissue Pressure Injury - Purple or maroon localized area of discolored intact skin or blood-filled blister due to damage of underlying soft tissue from pressure and/or shear.  Wound Description (Comments): Fluid filled blister with purple discoloration  Present on Admission: No  Dressing Type Foam - Lift dressing to assess site every shift 03/22/24 0750     Pressure Injury 03/06/24 Shoulder Left Deep Tissue Pressure Injury - Purple or maroon localized area of discolored intact skin or blood-filled blister due to damage of underlying soft tissue from pressure and/or shear. dark purple discoloration to should (Active)  03/06/24 1613  Location: Shoulder  Location Orientation: Left  Staging: Deep Tissue Pressure Injury - Purple or maroon localized area of discolored intact skin or blood-filled blister due to damage of underlying  soft tissue from pressure and/or shear.  Wound Description (Comments): dark purple discoloration to shoulder  Present on Admission: No  Dressing Type Foam - Lift dressing to assess site every shift 03/19/24 0800   Wound care consulted and recommendations given.   Malnutrition Type:  Nutrition Problem: Severe Malnutrition Etiology: chronic illness   Malnutrition Characteristics:  Signs/Symptoms: energy intake < or equal to 75% for > or equal to 1 month, severe muscle depletion   Nutrition Interventions:  Interventions: Ensure Enlive (each supplement provides 350kcal and 20 grams of protein), Magic cup, Education  Estimated body mass index is 23.15 kg/m  as calculated from the following:   Height as of this encounter: 5\' 11"  (1.803 m).   Weight as of this encounter: 75.3 kg.  Code Status: DNR DVT Prophylaxis:  SCDs Start: 03/04/24 2151 Place TED hose Start: 03/04/24 2151 apixaban  (ELIQUIS ) tablet 5 mg   Level of Care: Level of care: Telemetry Medical Family Communication: Updated patient's daughter at bedside  Disposition Plan:     Remains inpatient appropriate: pending SNF.   Procedures:  Bedside debridement of the sacral decubitus ulcer  Consultants:   General surgery.  Infectious disease PCCM Wound care nurse Cardiology Palliative care  Antimicrobials:   Anti-infectives (From admission, onward)    Start     Dose/Rate Route Frequency Ordered Stop   03/16/24 1015  cefTRIAXone  (ROCEPHIN ) 2 g in sodium chloride  0.9 % 100 mL IVPB        2 g 200 mL/hr over 30 Minutes Intravenous Every 24 hours 03/16/24 0929 03/20/24 1219   03/16/24 1015  doxycycline  (VIBRA -TABS) tablet 100 mg        100 mg Oral Every 12 hours 03/16/24 0929 03/20/24 2131   03/14/24 1000  metroNIDAZOLE  (FLAGYL ) tablet 500 mg        500 mg Oral Every 12 hours 03/14/24 0803 03/19/24 2219   03/13/24 1000  cefadroxil  (DURICEF) capsule 1,000 mg  Status:  Discontinued        1,000 mg Oral 2 times daily 03/12/24 1632 03/16/24 0929   03/07/24 2200  metroNIDAZOLE  (FLAGYL ) tablet 500 mg        500 mg Oral Every 12 hours 03/07/24 0840 03/11/24 2258   03/05/24 1930  vancomycin  (VANCOREADY) IVPB 1250 mg/250 mL  Status:  Discontinued        1,250 mg 166.7 mL/hr over 90 Minutes Intravenous Every 24 hours 03/04/24 2205 03/04/24 2224   03/05/24 1930  Vancomycin  (VANCOCIN ) 1,250 mg in sodium chloride  0.9 % 250 mL IVPB  Status:  Discontinued        1,250 mg 166.7 mL/hr over 90 Minutes Intravenous Every 24 hours 03/04/24 2224 03/05/24 0717   03/05/24 1930  vancomycin  (VANCOREADY) IVPB 1500 mg/300 mL  Status:  Discontinued        1,500 mg 150 mL/hr over 120 Minutes Intravenous  Every 24 hours 03/05/24 0717 03/12/24 1636   03/05/24 1400  ceFEPIme  (MAXIPIME ) 2 g in sodium chloride  0.9 % 100 mL IVPB  Status:  Discontinued        2 g 200 mL/hr over 30 Minutes Intravenous Every 8 hours 03/05/24 0717 03/12/24 1632   03/05/24 0600  ceFEPIme  (MAXIPIME ) 2 g in sodium chloride  0.9 % 100 mL IVPB  Status:  Discontinued        2 g 200 mL/hr over 30 Minutes Intravenous Every 12 hours 03/04/24 2205 03/05/24 0717   03/05/24 0000  metroNIDAZOLE  (FLAGYL ) IVPB 500 mg  Status:  Discontinued  500 mg 100 mL/hr over 60 Minutes Intravenous Every 12 hours 03/04/24 2206 03/04/24 2209   03/04/24 1730  aztreonam (AZACTAM) 2 g in sodium chloride  0.9 % 100 mL IVPB  Status:  Discontinued        2 g 200 mL/hr over 30 Minutes Intravenous  Once 03/04/24 1715 03/04/24 1724   03/04/24 1730  metroNIDAZOLE  (FLAGYL ) IVPB 500 mg  Status:  Discontinued        500 mg 100 mL/hr over 60 Minutes Intravenous Every 12 hours 03/04/24 1715 03/07/24 0840   03/04/24 1730  vancomycin  (VANCOREADY) IVPB 1500 mg/300 mL        1,500 mg 150 mL/hr over 120 Minutes Intravenous  Once 03/04/24 1715 03/04/24 2153   03/04/24 1730  ceFEPIme  (MAXIPIME ) 2 g in sodium chloride  0.9 % 100 mL IVPB        2 g 200 mL/hr over 30 Minutes Intravenous  Once 03/04/24 1724 03/04/24 1826        Medications  Scheduled Meds:  acetaminophen   500 mg Oral TID   amiodarone   200 mg Oral Daily   apixaban   5 mg Oral BID   ascorbic acid   500 mg Oral BID   atorvastatin   40 mg Oral Daily   Chlorhexidine  Gluconate Cloth  6 each Topical Daily   diclofenac  Sodium  4 g Topical QID   feeding supplement  237 mL Oral TID BM   ferrous sulfate   325 mg Oral QHS   furosemide   20 mg Oral Daily   guaiFENesin   600 mg Oral BID   insulin  aspart  0-15 Units Subcutaneous TID WC   insulin  aspart  0-5 Units Subcutaneous QHS   insulin  aspart  4 Units Subcutaneous TID WC   insulin  glargine-yfgn  15 Units Subcutaneous Daily   levothyroxine   50 mcg  Oral Q0600   liver oil-zinc  oxide   Topical BID   midodrine   5 mg Oral TID WC   nutrition supplement (JUVEN)  1 packet Oral BID BM   oxyCODONE   10 mg Oral Q12H   phosphorus  500 mg Oral TID   sodium chloride  flush  3 mL Intravenous Q12H   Tafamidis   1 capsule Oral Daily   Continuous Infusions: PRN Meds:.acetaminophen  **OR** acetaminophen , food thickener, lip balm, morphine  injection, ondansetron  **OR** ondansetron  (ZOFRAN ) IV, oxyCODONE , polyethylene glycol, senna-docusate, sodium chloride  flush    Subjective:   Earl [name] was seen and examined today.  Patient had some pain while changing the position. After the change he looked comfortable .  Pleasant and not in any distress.   Objective:   Vitals:   03/22/24 0436 03/22/24 0759 03/22/24 1207 03/22/24 1709  BP: 136/68 128/64 109/63 111/70  Pulse: 93 92 86 90  Resp: 20 16 17 19   Temp: 98.2 F (36.8 C) 98 F (36.7 C) 97.7 F (36.5 C) 98.7 F (37.1 C)  TempSrc: Oral Oral Oral Oral  SpO2: 100% 99% 98% 100%  Weight:      Height:        Intake/Output Summary (Last 24 hours) at 03/22/2024 1723 Last data filed at 03/22/2024 1200 Gross per 24 hour  Intake --  Output 2400 ml  Net -2400 ml   Filed Weights   03/04/24 1656 03/15/24 0500  Weight: 68.5 kg 75.3 kg     Exam General exam: Appears calm and comfortable  Respiratory system: Clear to auscultation. Respiratory effort normal. Cardiovascular system: S1 & S2 heard, RRR. No JVD, Gastrointestinal system: Abdomen is nondistended, soft and  nontender.  Central nervous system: Alert and oriented.  Non focal.  Extremities: warm extremities.  Skin: No rashes,, unstageable coccyx ulcer.  Psychiatry: Mood is appropriate.    Data Reviewed:  I have personally reviewed following labs and imaging studies   CBC Lab Results  Component Value Date   WBC 11.1 (H) 03/21/2024   RBC 3.36 (L) 03/21/2024   HGB 9.5 (L) 03/21/2024   HCT 28.9 (L) 03/21/2024   MCV 86.0  03/21/2024   MCH 28.3 03/21/2024   PLT 489 (H) 03/21/2024   MCHC 32.9 03/21/2024   RDW 15.8 (H) 03/21/2024   LYMPHSABS 1.6 03/21/2024   MONOABS 0.9 03/21/2024   EOSABS 0.2 03/21/2024   BASOSABS 0.1 03/21/2024     Last metabolic panel Lab Results  Component Value Date   NA 138 03/21/2024   K 4.1 03/21/2024   CL 105 03/21/2024   CO2 25 03/21/2024   BUN 16 03/21/2024   CREATININE 0.74 03/21/2024   GLUCOSE 172 (H) 03/21/2024   GFRNONAA >60 03/21/2024   GFRAA 77 05/17/2021   CALCIUM  8.0 (L) 03/21/2024   PHOS 3.0 03/20/2024   PROT 4.7 (L) 03/05/2024   ALBUMIN  2.2 (L) 03/20/2024   LABGLOB 2.4 02/22/2024   BILITOT 0.7 03/05/2024   ALKPHOS 72 03/05/2024   AST 41 03/05/2024   ALT 31 03/05/2024   ANIONGAP 8 03/21/2024    CBG (last 3)  Recent Labs    03/22/24 0838 03/22/24 1202 03/22/24 1710  GLUCAP 169* 214* 139*      Coagulation Profile: No results for input(s): "INR", "PROTIME" in the last 168 hours.   Radiology Studies: DG CHEST PORT 1 VIEW Result Date: 03/21/2024 CLINICAL DATA:  Sepsis EXAM: PORTABLE CHEST 1 VIEW COMPARISON:  03/15/2024 FINDINGS: Cardiac shadow is enlarged but stable. Increasing central vascular congestion is noted. Small left effusion is seen. No bony abnormality is noted. IMPRESSION: Persistent congestive failure with small left effusion. Electronically Signed   By: Violeta Grey M.D.   On: 03/21/2024 09:22       Feliciana Horn M.D. [name] 03/22/2024, 5:23 PM  Available via Epic secure chat 7am-7pm After 7 pm, please refer to night coverage provider listed on amion.

## 2024-03-23 DIAGNOSIS — A419 Sepsis, unspecified organism: Secondary | ICD-10-CM | POA: Diagnosis not present

## 2024-03-23 DIAGNOSIS — Z515 Encounter for palliative care: Secondary | ICD-10-CM | POA: Diagnosis not present

## 2024-03-23 DIAGNOSIS — E43 Unspecified severe protein-calorie malnutrition: Secondary | ICD-10-CM | POA: Diagnosis not present

## 2024-03-23 DIAGNOSIS — L98429 Non-pressure chronic ulcer of back with unspecified severity: Secondary | ICD-10-CM | POA: Diagnosis not present

## 2024-03-23 DIAGNOSIS — I480 Paroxysmal atrial fibrillation: Secondary | ICD-10-CM | POA: Diagnosis not present

## 2024-03-23 DIAGNOSIS — D472 Monoclonal gammopathy: Secondary | ICD-10-CM | POA: Diagnosis not present

## 2024-03-23 DIAGNOSIS — Z7189 Other specified counseling: Secondary | ICD-10-CM | POA: Diagnosis not present

## 2024-03-23 LAB — BASIC METABOLIC PANEL WITH GFR
Anion gap: 11 (ref 5–15)
BUN: 18 mg/dL (ref 8–23)
CO2: 25 mmol/L (ref 22–32)
Calcium: 8.2 mg/dL — ABNORMAL LOW (ref 8.9–10.3)
Chloride: 103 mmol/L (ref 98–111)
Creatinine, Ser: 0.71 mg/dL (ref 0.61–1.24)
GFR, Estimated: >60 mL/min (ref >60)
Glucose, Bld: 104 mg/dL — ABNORMAL HIGH (ref 70–99)
Potassium: 3.9 mmol/L (ref 3.5–5.1)
Sodium: 139 mmol/L (ref 135–145)

## 2024-03-23 LAB — PHOSPHORUS: Phosphorus: 3.6 mg/dL (ref 2.5–4.6)

## 2024-03-23 LAB — GLUCOSE, CAPILLARY
Glucose-Capillary: 160 mg/dL — ABNORMAL HIGH (ref 70–99)
Glucose-Capillary: 156 mg/dL — ABNORMAL HIGH (ref 70–99)
Glucose-Capillary: 82 mg/dL (ref 70–99)
Glucose-Capillary: 137 mg/dL — ABNORMAL HIGH (ref 70–99)

## 2024-03-23 LAB — ALBUMIN: Albumin: 1.9 g/dL — ABNORMAL LOW (ref 3.5–5.0)

## 2024-03-23 NOTE — Progress Notes (Signed)
 Daily Progress Note   Patient Name: Earl [name]       Date: 03/23/2024 DOB: 07-Sep-1941  Age: 83 y.o. MRN#: [id number] Attending Physician: Feliciana Horn, MD Primary Care Physician: [name], MD Admit Date: 03/04/2024  Reason for Consultation/Follow-up: Establishing goals of care  Subjective: Medical records reviewed including progress notes, labs, imaging. Patient assessed at the bedside. Denies concerns or complains. His daughter is present visiting. Per MAR, patient received 1 dose of PRN oxycodone  and 2 doses of PRN morphine  in the past 24 hours. He also received another dose of PRN morphine  from RN during my visit.   We discussed interval history since our last conversation. Ongoing pain management strategies were reviewed. The medications are effective, though patient still has difficulties with grogginess and confusion when taking short-acting Oxycodone . Options to address, including lower dose oral morphine  and continuing Oxycodone  as is, were discussed. Risks and benefits of titrating down medication (worsening pain, decreased side effects) were discussed. For now, will continue trial of current pain medications and determine if side effects subside with time. Provided education on measures to increase alertness and address any fluctuating delirium.  Questions and concerns addressed. PMT will continue to support holistically.   Length of Stay: 19   Physical Exam Vitals and nursing note reviewed.  Constitutional:      General: He is not in acute distress.    Appearance: He is ill-appearing.  Cardiovascular:     Rate and Rhythm: Tachycardia present.  Pulmonary:     Effort: Pulmonary effort is normal.  Neurological:     Mental Status: He is alert.  Psychiatric:        Behavior: Behavior normal. Behavior is  cooperative.            Vital Signs: BP 116/71 (BP Location: Left Arm)   Pulse (!) 124   Temp 97.9 F (36.6 C) (Oral)   Resp 18   Ht 5\' 11"  (1.803 m)   Wt 75.3 kg   SpO2 98%   BMI 23.15 kg/m  SpO2: SpO2: 98 % O2 Device: O2 Device: Room Air O2 Flow Rate:        Palliative Assessment/Data: 20% to 30%   Palliative Care Assessment & Plan   Patient Profile: Per intake H&P --> Patient is a 83 year old male with history of paroxysmal A-fib, insulin -dependent diabetes, hypertension, hyperlipidemia, CVA with residual right-sided weakness, MGUS, GERD, BPH, hypothyroidism, HFrEF with EF of 40 to 45%, chronic pancytopenia who presented to the emergency department from SNF with complaint of shortness of breath, wound, pain in the sacral area.    Palliative care evaluated Earl [name]  earlier this month and have been asked to get re-involved for additional goals of care conversations.   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 Recent COVID infection Severe protein calorie malnutrition  Recommendations/Plan: Continue DNR/DNI Continue current care plan Goals of care are clear; treat the treatable and encourage oral intake - NO PEG as this would not provide benefit Continue PRN IV morphine  and PRN oxycodone  for breakthrough pain Continue Oxycontin  10mg  BID Psychosocial and emotional support provided  Standard delirium precautions (adapted from NICE guidelines 2011 for prevention of delirium):  Provide continuity of care when possible (avoid frequent changing of surroundings and staff).  Frequent reorientation to time with:  A clock should always be visible.  Make sure Calendar/white board is updated.  Lights on/blinds open during the day and off/closed at night.  Encourage frequent family visits.  Monitor for and treat dehydration/constipation.   Optimize oxygen saturation.  Avoid catheters and IV's when possible and look for/treat infections.  Encourage early mobility.  Assess and treat pain  Ensure adequate nutrition and functioning dentures.  Address reversible causes of hearing and visual impairment:  Avoid sleep disturbance (normalize sleep/wake cycle).  Minimize disturbances and consider NOT obtaining vitals at night if possible.  PMT will continue to support as needed   Prognosis: Concerning prognosis in light of recurrent hospitalizations, declining functional/nutritional status, and several chronic comorbidities  Discharge Planning: Skilled Nursing Facility for rehab with Palliative care service follow-up  Care plan was discussed with patient, patient's daughter, RN          Earl [name] Earl Jericho, PA-C  Palliative Medicine Team Team phone # [phone]  Thank you for allowing the Palliative Medicine Team to assist in the care of this patient. Please utilize secure chat with additional questions, if there is no response within 30 minutes please call the above phone number.  Palliative Medicine Team providers are available by phone from 7am to 7pm daily and can be reached through the team cell phone.  Should this patient require assistance outside of these hours, please call the patient's attending physician.

## 2024-03-23 NOTE — Plan of Care (Signed)
  Problem: Fluid Volume: Goal: Hemodynamic stability will improve Outcome: Progressing   Problem: Clinical Measurements: Goal: Diagnostic test results will improve Outcome: Progressing Goal: Signs and symptoms of infection will decrease Outcome: Progressing   Problem: Respiratory: Goal: Ability to maintain adequate ventilation will improve Outcome: Progressing   Problem: Education: Goal: Knowledge of risk factors and measures for prevention of condition will improve Outcome: Progressing   Problem: Coping: Goal: Psychosocial and spiritual needs will be supported Outcome: Progressing

## 2024-03-23 NOTE — Progress Notes (Signed)
 [name]                                                                               Earl [name], is a 83 y.o. male, DOB - 1941/06/07, ZOX:[id number] Admit date - 03/04/2024    Outpatient Primary MD for the patient is [name], MD  LOS - 19  days    Brief summary    83 year old male with history of paroxysmal A-fib, insulin -dependent diabetes, hypertension, hyperlipidemia, CVA with residual right-sided weakness, MGUS, GERD, BPH, hypothyroidism, HFrEF with EF of 40 to 45%, chronic pancytopenia who presented to the emergency department from SNF with complaint of shortness of breath, wound, pain in the sacral area.  He was just discharged from [facility] to [facility] re[facility] 4 days ago.  Hospital course was remarkable for acute metabolic encephalopathy,  orthostatic hypotension, rhabdomyolysis, hypoglycemia.  On presentation, he was hypotensive, EKG showed A-fib with RVR, tachypnea but saturating fine on room air.  Respiratory panel negative for COVID.  PCCM consulted for persistent hypotension.  Cardiology also consulted.  General surgery consulted for debridement of sacral ulcer.    Status post bedside excisional debridement of the sacral wound on 4/15 and 4/17.  Wound care, cardiology, general surgery following.  No plan for further operative intervention for sacral ulcer.  Now hemodynamically stable.Prolonged hospitalization.   No new events overnight.  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 CT of the abdomen pelvis shows a sacral decubitus wound with extension in the right gluteus muscle with no signs of osteomyelitis. Seen by PCCM and started on broad-spectrum IV antibiotics, completed the course by 03/21/24. General surgery has been on board for the  sacral ulcer, underwent bedside excisional debridement of the sacral wound on 4/15 and 4/17.  General surgery reconsulted today and recommended the wound does not need further debridement. He will need routine wound care .ID consulted and recommended to continue antibiotics through 4/30 with wound care and nutritional support. Sepsis physiology has resolved.  Pt remains afebrile.  Patient remains comfortable, conversing. Pain controlled with the pain regimen.    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 Pt has intermittent bouts of afib. Remains asymptomatic. Bouts of tachycardia probably from pain when changing the position or wound dressings.  Slightly tachycardic this am, but rate has improved to 85/min later this afternoon. No changes in meds.   History of HFrEF/transthyretin cardiac amyloidosis/NICM/hypotension  Entresto  and Toprol  on hold for hypotension. BP parameters stable on midodrine  5 mg TID. Continue with low dose lasix  as BP allows.  CXR shows chf, no pneumonia. An extra dose of 20 mg of lasix  given yesterday evening. Urine output has improved. Creatinine t his morning is stable. Discussed with daughter at bedside.  Outpatient follow-up with cardiology  Left lower lobe pneumonia  Completed the course of antibiotics.  Repeat CXR  show CHF. No consolidation. Occasional cough and patient is on Mucinex .   Insulin -dependent diabetes mellitus type 2  Last A1c is 8.4 Continue with Semglee  15 units daily, novolog  4 units tidac and sliding scale insulin . CBG (last 3)  Recent Labs    03/22/24 2045 03/23/24 0854 03/23/24 1143  GLUCAP 107* 160* 156*   Cbgs improving.  No changes in meds. Continue to monitor.   GERD  Stable.    Anemia of chronic disease  S/p 1 unit of prbc transfusion.  Repeat hemoglobin around 9.   Continue with ferrous sulfate .   Hypothyroidism Continue Synthroid   Severe protein calorie malnutrition/suspicion for dysphagia Nutritionist and speech therapy on board Continue with nutritional supplementation Patient currently on pured diet and albumin  is 1.9 today.    Thrombocytosis:  Platelet around 489,000. Monitor.    Hyperlipidemia Resume Lipitor 40 mg daily.   Diarrhea earlier this week  and has a rectal tube.  It appears to have slowed down.  Rectal tube  removed.    Elderly deconditioned gentleman with recurrent hospitalizations, continues to decline has multiple medical issues.  Palliative care consulted and patient is currently a DNR Therapy evaluations have been ordered recommended SNF Patient is medically stable for discharge. Toc on board and plan for discharge when a bed becomes available.   RN Pressure Injury Documentation: Pressure Injury 02/26/24 Coccyx Mid Unstageable - Full thickness tissue loss in which the base of the injury is covered by slough (yellow, tan, gray, green or brown) and/or eschar (tan, brown or black) in the wound bed. (Active)  02/26/24 0916  Location: Coccyx  Location Orientation: Mid  Staging: Unstageable - Full thickness tissue loss in which the base of the injury is covered by slough (yellow, tan, gray, green or brown) and/or eschar (tan, brown or black) in the wound bed.  Wound Description (Comments):   Present on Admission: No  Dressing Type Silver hydrofiber;Foam - Lift dressing to assess site every shift 03/23/24 0742     Pressure Injury 02/26/24 Toe (Comment  which one) Anterior;Right Deep Tissue Pressure Injury - Purple or maroon localized area of discolored intact skin or blood-filled blister due to damage of underlying soft tissue from pressure and/or shear. right gre (Active)  02/26/24 1215  Location: Toe (Comment  which one)  Location Orientation: Anterior;Right  Staging: Deep Tissue Pressure Injury - Purple or maroon  localized area of discolored intact skin or blood-filled blister due to damage of underlying soft tissue from pressure and/or shear.  Wound Description (Comments): right great toe  Present on Admission:   Dressing Type Foam - Lift dressing to assess site every shift 03/19/24 0800     Pressure Injury 03/06/24 Hip Anterior;Left;Proximal Deep Tissue Pressure Injury - Purple or maroon localized area of discolored intact skin or blood-filled blister due to damage of underlying soft tissue from pressure and/or shear. Fluid filled blister w (Active)  03/06/24 1612  Location: Hip  Location Orientation: Anterior;Left;Proximal  Staging: Deep Tissue Pressure Injury - Purple or maroon localized area of discolored intact skin or blood-filled blister due to damage of underlying soft tissue from pressure and/or shear.  Wound Description (Comments): Fluid filled blister with purple discoloration  Present on Admission: No  Dressing Type Foam - Lift dressing to assess site every shift 03/23/24 0742     Pressure Injury 03/06/24 Shoulder Left Deep Tissue Pressure Injury - Purple or maroon localized area of discolored intact skin or blood-filled blister due to damage of underlying soft tissue from pressure  and/or shear. dark purple discoloration to should (Active)  03/06/24 1613  Location: Shoulder  Location Orientation: Left  Staging: Deep Tissue Pressure Injury - Purple or maroon localized area of discolored intact skin or blood-filled blister due to damage of underlying soft tissue from pressure and/or shear.  Wound Description (Comments): dark purple discoloration to shoulder  Present on Admission: No  Dressing Type Foam - Lift dressing to assess site every shift;Honey 03/22/24 1700   Wound care consulted and recommendations given.   Malnutrition Type:  Nutrition Problem: Severe Malnutrition Etiology: chronic illness   Malnutrition Characteristics:  Signs/Symptoms: energy intake < or equal to 75% for  > or equal to 1 month, severe muscle depletion   Nutrition Interventions:  Interventions: Ensure Enlive (each supplement provides 350kcal and 20 grams of protein), Magic cup, Education  Estimated body mass index is 23.15 kg/m as calculated from the following:   Height as of this encounter: 5\' 11"  (1.803 m).   Weight as of this encounter: 75.3 kg.  Code Status: DNR DVT Prophylaxis:  SCDs Start: 03/04/24 2151 Place TED hose Start: 03/04/24 2151 apixaban  (ELIQUIS ) tablet 5 mg   Level of Care: Level of care: Telemetry Medical Family Communication: Updated patient's daughter at bedside  Disposition Plan:     Remains inpatient appropriate: pending SNF.   Procedures:  Bedside debridement of the sacral decubitus ulcer  Consultants:   General surgery.  Infectious disease PCCM Wound care nurse Cardiology Palliative care  Antimicrobials:   Anti-infectives (From admission, onward)    Start     Dose/Rate Route Frequency Ordered Stop   03/16/24 1015  cefTRIAXone  (ROCEPHIN ) 2 g in sodium chloride  0.9 % 100 mL IVPB        2 g 200 mL/hr over 30 Minutes Intravenous Every 24 hours 03/16/24 0929 03/20/24 1219   03/16/24 1015  doxycycline  (VIBRA -TABS) tablet 100 mg        100 mg Oral Every 12 hours 03/16/24 0929 03/20/24 2131   03/14/24 1000  metroNIDAZOLE  (FLAGYL ) tablet 500 mg        500 mg Oral Every 12 hours 03/14/24 0803 03/19/24 2219   03/13/24 1000  cefadroxil  (DURICEF) capsule 1,000 mg  Status:  Discontinued        1,000 mg Oral 2 times daily 03/12/24 1632 03/16/24 0929   03/07/24 2200  metroNIDAZOLE  (FLAGYL ) tablet 500 mg        500 mg Oral Every 12 hours 03/07/24 0840 03/11/24 2258   03/05/24 1930  vancomycin  (VANCOREADY) IVPB 1250 mg/250 mL  Status:  Discontinued        1,250 mg 166.7 mL/hr over 90 Minutes Intravenous Every 24 hours 03/04/24 2205 03/04/24 2224   03/05/24 1930  Vancomycin  (VANCOCIN ) 1,250 mg in sodium chloride  0.9 % 250 mL IVPB  Status:  Discontinued         1,250 mg 166.7 mL/hr over 90 Minutes Intravenous Every 24 hours 03/04/24 2224 03/05/24 0717   03/05/24 1930  vancomycin  (VANCOREADY) IVPB 1500 mg/300 mL  Status:  Discontinued        1,500 mg 150 mL/hr over 120 Minutes Intravenous Every 24 hours 03/05/24 0717 03/12/24 1636   03/05/24 1400  ceFEPIme  (MAXIPIME ) 2 g in sodium chloride  0.9 % 100 mL IVPB  Status:  Discontinued        2 g 200 mL/hr over 30 Minutes Intravenous Every 8 hours 03/05/24 0717 03/12/24 1632   03/05/24 0600  ceFEPIme  (MAXIPIME ) 2 g in sodium chloride  0.9 % 100 mL  IVPB  Status:  Discontinued        2 g 200 mL/hr over 30 Minutes Intravenous Every 12 hours 03/04/24 2205 03/05/24 0717   03/05/24 0000  metroNIDAZOLE  (FLAGYL ) IVPB 500 mg  Status:  Discontinued        500 mg 100 mL/hr over 60 Minutes Intravenous Every 12 hours 03/04/24 2206 03/04/24 2209   03/04/24 1730  aztreonam (AZACTAM) 2 g in sodium chloride  0.9 % 100 mL IVPB  Status:  Discontinued        2 g 200 mL/hr over 30 Minutes Intravenous  Once 03/04/24 1715 03/04/24 1724   03/04/24 1730  metroNIDAZOLE  (FLAGYL ) IVPB 500 mg  Status:  Discontinued        500 mg 100 mL/hr over 60 Minutes Intravenous Every 12 hours 03/04/24 1715 03/07/24 0840   03/04/24 1730  vancomycin  (VANCOREADY) IVPB 1500 mg/300 mL        1,500 mg 150 mL/hr over 120 Minutes Intravenous  Once 03/04/24 1715 03/04/24 2153   03/04/24 1730  ceFEPIme  (MAXIPIME ) 2 g in sodium chloride  0.9 % 100 mL IVPB        2 g 200 mL/hr over 30 Minutes Intravenous  Once 03/04/24 1724 03/04/24 1826        Medications  Scheduled Meds:  acetaminophen   500 mg Oral TID   amiodarone   200 mg Oral Daily   apixaban   5 mg Oral BID   ascorbic acid   500 mg Oral BID   atorvastatin   40 mg Oral Daily   Chlorhexidine  Gluconate Cloth  6 each Topical Daily   diclofenac  Sodium  4 g Topical QID   feeding supplement  237 mL Oral TID BM   ferrous sulfate   325 mg Oral QHS   furosemide   20 mg Oral Daily   guaiFENesin   600 mg  Oral BID   insulin  aspart  0-15 Units Subcutaneous TID WC   insulin  aspart  0-5 Units Subcutaneous QHS   insulin  aspart  4 Units Subcutaneous TID WC   insulin  glargine-yfgn  15 Units Subcutaneous Daily   levothyroxine   50 mcg Oral Q0600   liver oil-zinc  oxide   Topical BID   midodrine   5 mg Oral TID WC   nutrition supplement (JUVEN)  1 packet Oral BID BM   oxyCODONE   10 mg Oral Q12H   phosphorus  500 mg Oral TID   sodium chloride  flush  3 mL Intravenous Q12H   Tafamidis   1 capsule Oral Daily   Continuous Infusions: PRN Meds:.acetaminophen  **OR** acetaminophen , food thickener, lip balm, morphine  injection, ondansetron  **OR** ondansetron  (ZOFRAN ) IV, oxyCODONE , polyethylene glycol, senna-docusate, sodium chloride  flush    Subjective:   Jefferson Hitch was seen and examined today.  He looks comfortable, no new events overnight. No BM today.   Objective:   Vitals:   03/23/24 0400 03/23/24 0856 03/23/24 1024 03/23/24 1245  BP: (!) 118/92 116/71 105/66 106/65  Pulse: 96 (!) 124 (!) 117 85  Resp: 18 18  18   Temp:  97.9 F (36.6 C) 98.6 F (37 C) 98.4 F (36.9 C)  TempSrc:  Oral Oral Oral  SpO2:  98% 100% 98%  Weight:      Height:        Intake/Output Summary (Last 24 hours) at 03/23/2024 1544 Last data filed at 03/23/2024 1245 Gross per 24 hour  Intake --  Output 3000 ml  Net -3000 ml   Filed Weights   03/04/24 1656 03/15/24 0500  Weight: 68.5 kg 75.3 kg  Exam General exam:  Elderly gentleman, comfortable, no distress noted.  Respiratory system: air entry fair, no wheezing heard. On RA.  Cardiovascular system: S1 & S2 heard, irregularly irregular, pedal edema present.  Gastrointestinal system: Abdomen is nondistended, soft and nontender.  Central nervous system: Alert and oriented. No focal neurological deficits. Extremities: pedal edema present.  Skin: coccyx decubitus ulcer.  Psychiatry: pleasant. And comfortable.     Data Reviewed:  I have personally  reviewed following labs and imaging studies   CBC Lab Results  Component Value Date   WBC 11.1 (H) 03/21/2024   RBC 3.36 (L) 03/21/2024   HGB 9.5 (L) 03/21/2024   HCT 28.9 (L) 03/21/2024   MCV 86.0 03/21/2024   MCH 28.3 03/21/2024   PLT 489 (H) 03/21/2024   MCHC 32.9 03/21/2024   RDW 15.8 (H) 03/21/2024   LYMPHSABS 1.6 03/21/2024   MONOABS 0.9 03/21/2024   EOSABS 0.2 03/21/2024   BASOSABS 0.1 03/21/2024     Last metabolic panel Lab Results  Component Value Date   NA 139 03/23/2024   K 3.9 03/23/2024   CL 103 03/23/2024   CO2 25 03/23/2024   BUN 18 03/23/2024   CREATININE 0.71 03/23/2024   GLUCOSE 104 (H) 03/23/2024   GFRNONAA >60 03/23/2024   GFRAA 77 05/17/2021   CALCIUM  8.2 (L) 03/23/2024   PHOS 3.6 03/23/2024   PROT 4.7 (L) 03/05/2024   ALBUMIN  1.9 (L) 03/23/2024   LABGLOB 2.4 02/22/2024   BILITOT 0.7 03/05/2024   ALKPHOS 72 03/05/2024   AST 41 03/05/2024   ALT 31 03/05/2024   ANIONGAP 11 03/23/2024    CBG (last 3)  Recent Labs    03/22/24 2045 03/23/24 0854 03/23/24 1143  GLUCAP 107* 160* 156*      Coagulation Profile: No results for input(s): "INR", "PROTIME" in the last 168 hours.   Radiology Studies: No results found.      Feliciana Horn M.D. [name] 03/23/2024, 3:44 PM  Available via Epic secure chat 7am-7pm After 7 pm, please refer to night coverage provider listed on amion.

## 2024-03-23 NOTE — Plan of Care (Signed)
  Problem: Fluid Volume: Goal: Hemodynamic stability will improve Outcome: Progressing   Problem: Clinical Measurements: Goal: Diagnostic test results will improve Outcome: Progressing Goal: Signs and symptoms of infection will decrease Outcome: Progressing   Problem: Respiratory: Goal: Ability to maintain adequate ventilation will improve Outcome: Progressing   Problem: Education: Goal: Knowledge of risk factors and measures for prevention of condition will improve Outcome: Progressing   Problem: Coping: Goal: Psychosocial and spiritual needs will be supported Outcome: Progressing   Problem: Respiratory: Goal: Will maintain a patent airway Outcome: Progressing Goal: Complications related to the disease process, condition or treatment will be avoided or minimized Outcome: Progressing   Problem: Education: Goal: Ability to describe self-care measures that may prevent or decrease complications (Diabetes Survival Skills Education) will improve Outcome: Progressing Goal: Individualized Educational Video(s) Outcome: Progressing   Problem: Coping: Goal: Ability to adjust to condition or change in health will improve Outcome: Progressing   Problem: Fluid Volume: Goal: Ability to maintain a balanced intake and output will improve Outcome: Progressing   Problem: Health Behavior/Discharge Planning: Goal: Ability to identify and utilize available resources and services will improve Outcome: Progressing Goal: Ability to manage health-related needs will improve Outcome: Progressing   Problem: Metabolic: Goal: Ability to maintain appropriate glucose levels will improve Outcome: Progressing   Problem: Nutritional: Goal: Maintenance of adequate nutrition will improve Outcome: Progressing Goal: Progress toward achieving an optimal weight will improve Outcome: Progressing   Problem: Skin Integrity: Goal: Risk for impaired skin integrity will decrease Outcome: Progressing    Problem: Tissue Perfusion: Goal: Adequacy of tissue perfusion will improve Outcome: Progressing   Problem: Education: Goal: Knowledge of General Education information will improve Description: Including pain rating scale, medication(s)/side effects and non-pharmacologic comfort measures Outcome: Progressing   Problem: Health Behavior/Discharge Planning: Goal: Ability to manage health-related needs will improve Outcome: Progressing   Problem: Clinical Measurements: Goal: Ability to maintain clinical measurements within normal limits will improve Outcome: Progressing Goal: Will remain free from infection Outcome: Progressing Goal: Diagnostic test results will improve Outcome: Progressing Goal: Respiratory complications will improve Outcome: Progressing Goal: Cardiovascular complication will be avoided Outcome: Progressing   Problem: Activity: Goal: Risk for activity intolerance will decrease Outcome: Progressing   Problem: Nutrition: Goal: Adequate nutrition will be maintained Outcome: Progressing   Problem: Coping: Goal: Level of anxiety will decrease Outcome: Progressing   Problem: Elimination: Goal: Will not experience complications related to bowel motility Outcome: Progressing Goal: Will not experience complications related to urinary retention Outcome: Progressing   Problem: Pain Managment: Goal: General experience of comfort will improve and/or be controlled Outcome: Progressing   Problem: Safety: Goal: Ability to remain free from injury will improve Outcome: Progressing   Problem: Skin Integrity: Goal: Risk for impaired skin integrity will decrease Outcome: Progressing

## 2024-03-23 NOTE — Progress Notes (Addendum)
 Sacral wound dressing change completed per order. Patient tolerated well.

## 2024-03-24 DIAGNOSIS — I480 Paroxysmal atrial fibrillation: Secondary | ICD-10-CM | POA: Diagnosis not present

## 2024-03-24 DIAGNOSIS — A419 Sepsis, unspecified organism: Secondary | ICD-10-CM | POA: Diagnosis not present

## 2024-03-24 DIAGNOSIS — D472 Monoclonal gammopathy: Secondary | ICD-10-CM | POA: Diagnosis not present

## 2024-03-24 DIAGNOSIS — L98429 Non-pressure chronic ulcer of back with unspecified severity: Secondary | ICD-10-CM | POA: Diagnosis not present

## 2024-03-24 LAB — GLUCOSE, CAPILLARY
Glucose-Capillary: 152 mg/dL — ABNORMAL HIGH (ref 70–99)
Glucose-Capillary: 200 mg/dL — ABNORMAL HIGH (ref 70–99)
Glucose-Capillary: 138 mg/dL — ABNORMAL HIGH (ref 70–99)
Glucose-Capillary: 90 mg/dL (ref 70–99)

## 2024-03-24 MED ORDER — SENNOSIDES-DOCUSATE SODIUM 8.6-50 MG PO TABS
1.0000 | ORAL_TABLET | Freq: Every day | ORAL | Status: DC
  Administered 2024-03-24 – 2024-07-28 (×99): 1 via ORAL
  Filled 2024-03-24 (×108): qty 1

## 2024-03-24 NOTE — Progress Notes (Signed)
 [name]                                                                               Earl [name], is a 83 y.o. male, DOB - June 02, 1941, WUJ:[id number] Admit date - 03/04/2024    Outpatient Primary MD for the patient is [name], MD  LOS - 20  days    Brief summary    83 year old male with history of paroxysmal A-fib, insulin -dependent diabetes, hypertension, hyperlipidemia, CVA with residual right-sided weakness, MGUS, GERD, BPH, hypothyroidism, HFrEF with EF of 40 to 45%, chronic pancytopenia who presented to the emergency department from SNF with complaint of shortness of breath, wound, pain in the sacral area.  He was just discharged from [facility] to [place] rehab 4 days ago.  Hospital course was remarkable for acute metabolic encephalopathy,  orthostatic hypotension, rhabdomyolysis, hypoglycemia.  On presentation, he was hypotensive, EKG showed A-fib with RVR, tachypnea but saturating fine on room air.  Respiratory panel negative for COVID.  PCCM consulted for persistent hypotension.  Cardiology also consulted.  General surgery consulted for debridement of sacral ulcer.    Status post bedside excisional debridement of the sacral wound on 4/15 and 4/17.  Wound care, cardiology, general surgery following.  No plan for further operative intervention for sacral ulcer.  Now hemodynamically stable.Prolonged hospitalization.   No new events overnight.  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 CT of the abdomen pelvis shows a sacral decubitus wound with extension in the right gluteus muscle with no signs of osteomyelitis. Seen by PCCM and started on broad-spectrum IV antibiotics, completed the course by 03/21/24. General surgery has been on board for the  sacral ulcer, underwent bedside excisional debridement of the sacral wound on 4/15 and 4/17.  General surgery reconsulted and recommended the wound does not need further debridement. He will need routine wound care .ID consulted and patient completed the course of antibiotics.  Continue with wound care and nutritional support. Sepsis physiology has resolved.  Patient has considerable pain while changing positions and with wound check and wound care.  Pain control with IV morphine  and oxygen contin 10 mg every 12 hours and oxycodone  2.5 mg every 4 hours prn. Palliative on board for pain management and symptom management.    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 Pt has intermittent bouts of afib. Remains asymptomatic. Bouts of tachycardia probably from pain when changing the position or wound dressings.  Slightly tachycardic this am, but rate has improved to 85/min later this afternoon. No changes in meds.   History of HFrEF/transthyretin cardiac amyloidosis/NICM/hypotension  Entresto  and Toprol  on hold for hypotension. BP parameters stable on midodrine  5 mg TID. Continue with low dose lasix  as BP allows.  CXR shows chf, no pneumonia. An extra dose of 20 mg of lasix  given yesterday evening. Urine output  has improved. Creatinine t his morning is stable. Discussed with daughter at bedside.  Outpatient follow-up with cardiology  Left lower lobe pneumonia  Completed the course of antibiotics.  Repeat CXR show CHF. No consolidation. Occasional cough and patient is on Mucinex .   Insulin -dependent diabetes mellitus type 2  Last A1c is 8.4 Continue with Semglee  15 units daily, novolog  4 units tidac and sliding scale insulin . CBG (last 3)  Recent Labs    03/24/24 0827 03/24/24 1138 03/24/24 1535  GLUCAP 152* 200* 138*   Cbgs improving.   No changes in meds. Continue to monitor.   GERD  Stable.    Anemia of chronic disease  S/p 1 unit of prbc transfusion.  Repeat hemoglobin around 9.  Continue with ferrous sulfate .   Hypothyroidism Continue Synthroid   Severe protein calorie malnutrition/suspicion for dysphagia Nutritionist and speech therapy on board Continue with nutritional supplementation Patient currently on pured diet and albumin  is 1.9 today.    Thrombocytosis:  Platelet around 489,000. Monitor.    Hyperlipidemia Resume Lipitor 40 mg daily.   Diarrhea last week. Resolved.  Rectal tube removed.  No BM for two days.  Start senna and colace tonight and hold if stools are runny.    Low grade temp earlier this am at 100 F Will get labs in am. He denies any dysuria.     Elderly deconditioned gentleman with recurrent hospitalizations, continues to decline has multiple medical issues.  Palliative care consulted and patient is currently a DNR Therapy evaluations have been ordered recommended SNF Patient is medically stable for discharge. Toc on board and plan for discharge when a bed becomes available.   RN Pressure Injury Documentation: Pressure Injury 02/26/24 Coccyx Mid Unstageable - Full thickness tissue loss in which the base of the injury is covered by slough (yellow, tan, gray, green or brown) and/or eschar (tan, brown or black) in the wound bed. (Active)  02/26/24 0916  Location: Coccyx  Location Orientation: Mid  Staging: Unstageable - Full thickness tissue loss in which the base of the injury is covered by slough (yellow, tan, gray, green or brown) and/or eschar (tan, brown or black) in the wound bed.  Wound Description (Comments):   Present on Admission: No  Dressing Type Silver hydrofiber;Other (Comment) 03/24/24 0630     Pressure Injury 02/26/24 Toe (Comment  which one) Anterior;Right Deep Tissue Pressure Injury - Purple or maroon localized area of discolored intact skin or blood-filled  blister due to damage of underlying soft tissue from pressure and/or shear. right gre (Active)  02/26/24 1215  Location: Toe (Comment  which one)  Location Orientation: Anterior;Right  Staging: Deep Tissue Pressure Injury - Purple or maroon localized area of discolored intact skin or blood-filled blister due to damage of underlying soft tissue from pressure and/or shear.  Wound Description (Comments): right great toe  Present on Admission:   Dressing Type Foam - Lift dressing to assess site every shift 03/19/24 0800     Pressure Injury 03/06/24 Hip Anterior;Left;Proximal Deep Tissue Pressure Injury - Purple or maroon localized area of discolored intact skin or blood-filled blister due to damage of underlying soft tissue from pressure and/or shear. Fluid filled blister w (Active)  03/06/24 1612  Location: Hip  Location Orientation: Anterior;Left;Proximal  Staging: Deep Tissue Pressure Injury - Purple or maroon localized area of discolored intact skin or blood-filled blister due to damage of underlying soft tissue from pressure and/or shear.  Wound Description (Comments): Fluid filled blister with purple discoloration  Present on  Admission: No  Dressing Type Impregnated gauze (bismuth);Foam - Lift dressing to assess site every shift 03/24/24 0630     Pressure Injury 03/06/24 Shoulder Left Deep Tissue Pressure Injury - Purple or maroon localized area of discolored intact skin or blood-filled blister due to damage of underlying soft tissue from pressure and/or shear. dark purple discoloration to should (Active)  03/06/24 1613  Location: Shoulder  Location Orientation: Left  Staging: Deep Tissue Pressure Injury - Purple or maroon localized area of discolored intact skin or blood-filled blister due to damage of underlying soft tissue from pressure and/or shear.  Wound Description (Comments): dark purple discoloration to shoulder  Present on Admission: No  Dressing Type Foam - Lift dressing to  assess site every shift;Honey 03/22/24 1700   Wound care consulted and recommendations given.   Malnutrition Type:  Nutrition Problem: Severe Malnutrition Etiology: chronic illness   Malnutrition Characteristics:  Signs/Symptoms: energy intake < or equal to 75% for > or equal to 1 month, severe muscle depletion   Nutrition Interventions:  Interventions: Ensure Enlive (each supplement provides 350kcal and 20 grams of protein), Magic cup, Education  Estimated body mass index is 23.15 kg/m as calculated from the following:   Height as of this encounter: 5\' 11"  (1.803 m).   Weight as of this encounter: 75.3 kg.  Code Status: DNR DVT Prophylaxis:  SCDs Start: 03/04/24 2151 Place TED hose Start: 03/04/24 2151 apixaban  (ELIQUIS ) tablet 5 mg   Level of Care: Level of care: Telemetry Medical Family Communication: Updated patient's daughter at bedside  Disposition Plan:     Remains inpatient appropriate: pending SNF.   Procedures:  Bedside debridement of the sacral decubitus ulcer  Consultants:   General surgery.  Infectious disease PCCM Wound care nurse Cardiology Palliative care  Antimicrobials:   Anti-infectives (From admission, onward)    Start     Dose/Rate Route Frequency Ordered Stop   03/16/24 1015  cefTRIAXone  (ROCEPHIN ) 2 g in sodium chloride  0.9 % 100 mL IVPB        2 g 200 mL/hr over 30 Minutes Intravenous Every 24 hours 03/16/24 0929 03/20/24 1219   03/16/24 1015  doxycycline  (VIBRA -TABS) tablet 100 mg        100 mg Oral Every 12 hours 03/16/24 0929 03/20/24 2131   03/14/24 1000  metroNIDAZOLE  (FLAGYL ) tablet 500 mg        500 mg Oral Every 12 hours 03/14/24 0803 03/19/24 2219   03/13/24 1000  cefadroxil  (DURICEF) capsule 1,000 mg  Status:  Discontinued        1,000 mg Oral 2 times daily 03/12/24 1632 03/16/24 0929   03/07/24 2200  metroNIDAZOLE  (FLAGYL ) tablet 500 mg        500 mg Oral Every 12 hours 03/07/24 0840 03/11/24 2258   03/05/24 1930   vancomycin  (VANCOREADY) IVPB 1250 mg/250 mL  Status:  Discontinued        1,250 mg 166.7 mL/hr over 90 Minutes Intravenous Every 24 hours 03/04/24 2205 03/04/24 2224   03/05/24 1930  Vancomycin  (VANCOCIN ) 1,250 mg in sodium chloride  0.9 % 250 mL IVPB  Status:  Discontinued        1,250 mg 166.7 mL/hr over 90 Minutes Intravenous Every 24 hours 03/04/24 2224 03/05/24 0717   03/05/24 1930  vancomycin  (VANCOREADY) IVPB 1500 mg/300 mL  Status:  Discontinued        1,500 mg 150 mL/hr over 120 Minutes Intravenous Every 24 hours 03/05/24 0717 03/12/24 1636   03/05/24 1400  ceFEPIme  (  MAXIPIME ) 2 g in sodium chloride  0.9 % 100 mL IVPB  Status:  Discontinued        2 g 200 mL/hr over 30 Minutes Intravenous Every 8 hours 03/05/24 0717 03/12/24 1632   03/05/24 0600  ceFEPIme  (MAXIPIME ) 2 g in sodium chloride  0.9 % 100 mL IVPB  Status:  Discontinued        2 g 200 mL/hr over 30 Minutes Intravenous Every 12 hours 03/04/24 2205 03/05/24 0717   03/05/24 0000  metroNIDAZOLE  (FLAGYL ) IVPB 500 mg  Status:  Discontinued        500 mg 100 mL/hr over 60 Minutes Intravenous Every 12 hours 03/04/24 2206 03/04/24 2209   03/04/24 1730  aztreonam (AZACTAM) 2 g in sodium chloride  0.9 % 100 mL IVPB  Status:  Discontinued        2 g 200 mL/hr over 30 Minutes Intravenous  Once 03/04/24 1715 03/04/24 1724   03/04/24 1730  metroNIDAZOLE  (FLAGYL ) IVPB 500 mg  Status:  Discontinued        500 mg 100 mL/hr over 60 Minutes Intravenous Every 12 hours 03/04/24 1715 03/07/24 0840   03/04/24 1730  vancomycin  (VANCOREADY) IVPB 1500 mg/300 mL        1,500 mg 150 mL/hr over 120 Minutes Intravenous  Once 03/04/24 1715 03/04/24 2153   03/04/24 1730  ceFEPIme  (MAXIPIME ) 2 g in sodium chloride  0.9 % 100 mL IVPB        2 g 200 mL/hr over 30 Minutes Intravenous  Once 03/04/24 1724 03/04/24 1826        Medications  Scheduled Meds:  acetaminophen   500 mg Oral TID   amiodarone   200 mg Oral Daily   apixaban   5 mg Oral BID    ascorbic acid   500 mg Oral BID   atorvastatin   40 mg Oral Daily   Chlorhexidine  Gluconate Cloth  6 each Topical Daily   diclofenac  Sodium  4 g Topical QID   feeding supplement  237 mL Oral TID BM   ferrous sulfate   325 mg Oral QHS   furosemide   20 mg Oral Daily   guaiFENesin   600 mg Oral BID   insulin  aspart  0-15 Units Subcutaneous TID WC   insulin  aspart  0-5 Units Subcutaneous QHS   insulin  aspart  4 Units Subcutaneous TID WC   insulin  glargine-yfgn  15 Units Subcutaneous Daily   levothyroxine   50 mcg Oral Q0600   liver oil-zinc  oxide   Topical BID   midodrine   5 mg Oral TID WC   nutrition supplement (JUVEN)  1 packet Oral BID BM   oxyCODONE   10 mg Oral Q12H   phosphorus  500 mg Oral TID   sodium chloride  flush  3 mL Intravenous Q12H   Tafamidis   1 capsule Oral Daily   Continuous Infusions: PRN Meds:.acetaminophen  **OR** acetaminophen , food thickener, lip balm, morphine  injection, ondansetron  **OR** ondansetron  (ZOFRAN ) IV, oxyCODONE , polyethylene glycol, senna-docusate, sodium chloride  flush    Subjective:   Earl [name] was seen and examined today.  He looks tired today. In considerable pain while changing the position .   Objective:   Vitals:   03/23/24 2030 03/24/24 0451 03/24/24 0828 03/24/24 1539  BP: 109/64 123/60 110/71 (!) 102/59  Pulse: 91 84 93 (!) 106  Resp: 16 16 18 18   Temp: 100 F (37.8 C) 98.1 F (36.7 C) 99.1 F (37.3 C) 97.9 F (36.6 C)  TempSrc: Oral Oral Oral Oral  SpO2: 99% 98% 97% 96%  Weight:  Height:        Intake/Output Summary (Last 24 hours) at 03/24/2024 1936 Last data filed at 03/24/2024 1546 Gross per 24 hour  Intake 3 ml  Output 2200 ml  Net -2197 ml   Filed Weights   03/04/24 1656 03/15/24 0500  Weight: 68.5 kg 75.3 kg     Exam General exam: Appears calm and comfortable  Respiratory system: Clear to auscultation. Respiratory effort normal. Cardiovascular system: S1 & S2 heard, RRR. No JVD, Gastrointestinal  system: Abdomen is nondistended, soft and nontender. N Central nervous system: Alert and oriented. No focal neurological deficits. Extremities: leg edema.  Skin: No rashes,unstageable coccyx decubitus.  Psychiatry: flat affect.      Data Reviewed:  I have personally reviewed following labs and imaging studies   CBC Lab Results  Component Value Date   WBC 11.1 (H) 03/21/2024   RBC 3.36 (L) 03/21/2024   HGB 9.5 (L) 03/21/2024   HCT 28.9 (L) 03/21/2024   MCV 86.0 03/21/2024   MCH 28.3 03/21/2024   PLT 489 (H) 03/21/2024   MCHC 32.9 03/21/2024   RDW 15.8 (H) 03/21/2024   LYMPHSABS 1.6 03/21/2024   MONOABS 0.9 03/21/2024   EOSABS 0.2 03/21/2024   BASOSABS 0.1 03/21/2024     Last metabolic panel Lab Results  Component Value Date   NA 139 03/23/2024   K 3.9 03/23/2024   CL 103 03/23/2024   CO2 25 03/23/2024   BUN 18 03/23/2024   CREATININE 0.71 03/23/2024   GLUCOSE 104 (H) 03/23/2024   GFRNONAA >60 03/23/2024   GFRAA 77 05/17/2021   CALCIUM  8.2 (L) 03/23/2024   PHOS 3.6 03/23/2024   PROT 4.7 (L) 03/05/2024   ALBUMIN  1.9 (L) 03/23/2024   LABGLOB 2.4 02/22/2024   BILITOT 0.7 03/05/2024   ALKPHOS 72 03/05/2024   AST 41 03/05/2024   ALT 31 03/05/2024   ANIONGAP 11 03/23/2024    CBG (last 3)  Recent Labs    03/24/24 0827 03/24/24 1138 03/24/24 1535  GLUCAP 152* 200* 138*      Coagulation Profile: No results for input(s): "INR", "PROTIME" in the last 168 hours.   Radiology Studies: No results found.      Feliciana Horn M.D. [name] 03/24/2024, 7:36 PM  Available via Epic secure chat 7am-7pm After 7 pm, please refer to night coverage provider listed on amion.

## 2024-03-24 NOTE — Progress Notes (Signed)
 Speech Language Pathology Treatment: Dysphagia  Patient Details Name: Earl [name] MRN: [id number] DOB: Sep 24, 1941 Today's Date: 03/24/2024 Time: 1530-1540 SLP Time Calculation (min) (ACUTE ONLY): 10 min  Assessment / Plan / Recommendation Clinical Impression  Patient seen by SLP for skilled treatment focused on dysphagia goals. His daughter, [name] was present in room as well. Patient was awake, alert but starting to become drowsy as he had recently been given medication for pain per daughter. Daughter reported that she continues to elevate [facility] as well as reverse Trendelenburg for PO's and makes sure he remains upright at least 15 minutes after PO intake. She continues to indicate that puree solids are being tolerated well by patient and wishes for him to remain on this consistency for solids. She plans to trial some of the honey thick liquids when patient is more tired out from PT, OT sessions but to continue with thin liquids when he is adequately awake and alert. SLP in agreement with this plan and will continue to follow.    HPI HPI: Patient is an 83 y.o. 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 He was discharged from the hospital (admitted for acute metabolic encephalopathy, hypotensive, hypoglycemic) four days ago to [facility] for rehab. He presented back to the hospital on 03/04/24 with SOB and pain in sacral area. 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 SLP ordered due to suspicious for dysphagia.      SLP Plan  Continue with current plan of care      Recommendations for follow up therapy are one component of a multi-disciplinary discharge planning process, led by the attending physician.  Recommendations may be updated based on patient  status, additional functional criteria and insurance authorization.    Recommendations  Diet recommendations: Dysphagia 1 (puree);Thin liquid Liquids provided via: Cup;Straw Medication Administration: Crushed with puree Supervision: Trained caregiver to feed patient Compensations: Slow rate;Small sips/bites;Use straw to facilitate chin tuck Postural Changes and/or Swallow Maneuvers: Seated upright 90 degrees;Upright 30-60 min after meal                  Oral care BID   Frequent or constant Supervision/Assistance Dysphagia, unspecified (R13.10)     Continue with current plan of care     Jacqualine Mater, MA, CCC-SLP Speech Therapy

## 2024-03-24 NOTE — Plan of Care (Signed)
  Problem: Fluid Volume: Goal: Hemodynamic stability will improve Outcome: Progressing   Problem: Clinical Measurements: Goal: Diagnostic test results will improve Outcome: Progressing Goal: Signs and symptoms of infection will decrease Outcome: Progressing   Problem: Respiratory: Goal: Ability to maintain adequate ventilation will improve Outcome: Progressing   Problem: Education: Goal: Knowledge of risk factors and measures for prevention of condition will improve Outcome: Progressing   Problem: Coping: Goal: Psychosocial and spiritual needs will be supported Outcome: Progressing   Problem: Respiratory: Goal: Will maintain a patent airway Outcome: Progressing Goal: Complications related to the disease process, condition or treatment will be avoided or minimized Outcome: Progressing   Problem: Education: Goal: Ability to describe self-care measures that may prevent or decrease complications (Diabetes Survival Skills Education) will improve Outcome: Progressing Goal: Individualized Educational Video(s) Outcome: Progressing   Problem: Coping: Goal: Ability to adjust to condition or change in health will improve Outcome: Progressing   Problem: Fluid Volume: Goal: Ability to maintain a balanced intake and output will improve Outcome: Progressing   Problem: Health Behavior/Discharge Planning: Goal: Ability to identify and utilize available resources and services will improve Outcome: Progressing Goal: Ability to manage health-related needs will improve Outcome: Progressing   Problem: Metabolic: Goal: Ability to maintain appropriate glucose levels will improve Outcome: Progressing   Problem: Nutritional: Goal: Maintenance of adequate nutrition will improve Outcome: Progressing Goal: Progress toward achieving an optimal weight will improve Outcome: Progressing   Problem: Skin Integrity: Goal: Risk for impaired skin integrity will decrease Outcome: Progressing    Problem: Tissue Perfusion: Goal: Adequacy of tissue perfusion will improve Outcome: Progressing   Problem: Education: Goal: Knowledge of General Education information will improve Description: Including pain rating scale, medication(s)/side effects and non-pharmacologic comfort measures Outcome: Progressing   Problem: Health Behavior/Discharge Planning: Goal: Ability to manage health-related needs will improve Outcome: Progressing   Problem: Clinical Measurements: Goal: Ability to maintain clinical measurements within normal limits will improve Outcome: Progressing Goal: Will remain free from infection Outcome: Progressing Goal: Diagnostic test results will improve Outcome: Progressing Goal: Respiratory complications will improve Outcome: Progressing Goal: Cardiovascular complication will be avoided Outcome: Progressing   Problem: Activity: Goal: Risk for activity intolerance will decrease Outcome: Progressing   Problem: Nutrition: Goal: Adequate nutrition will be maintained Outcome: Progressing   Problem: Coping: Goal: Level of anxiety will decrease Outcome: Progressing   Problem: Elimination: Goal: Will not experience complications related to bowel motility Outcome: Progressing Goal: Will not experience complications related to urinary retention Outcome: Progressing   Problem: Pain Managment: Goal: General experience of comfort will improve and/or be controlled Outcome: Progressing   Problem: Safety: Goal: Ability to remain free from injury will improve Outcome: Progressing   Problem: Skin Integrity: Goal: Risk for impaired skin integrity will decrease Outcome: Progressing

## 2024-03-25 DIAGNOSIS — Z515 Encounter for palliative care: Secondary | ICD-10-CM | POA: Diagnosis not present

## 2024-03-25 DIAGNOSIS — A419 Sepsis, unspecified organism: Secondary | ICD-10-CM | POA: Diagnosis not present

## 2024-03-25 DIAGNOSIS — L98429 Non-pressure chronic ulcer of back with unspecified severity: Secondary | ICD-10-CM | POA: Diagnosis not present

## 2024-03-25 DIAGNOSIS — Z7189 Other specified counseling: Secondary | ICD-10-CM | POA: Diagnosis not present

## 2024-03-25 DIAGNOSIS — D472 Monoclonal gammopathy: Secondary | ICD-10-CM | POA: Diagnosis not present

## 2024-03-25 DIAGNOSIS — E43 Unspecified severe protein-calorie malnutrition: Secondary | ICD-10-CM | POA: Diagnosis not present

## 2024-03-25 DIAGNOSIS — I480 Paroxysmal atrial fibrillation: Secondary | ICD-10-CM | POA: Diagnosis not present

## 2024-03-25 LAB — GLUCOSE, CAPILLARY
Glucose-Capillary: 119 mg/dL — ABNORMAL HIGH (ref 70–99)
Glucose-Capillary: 133 mg/dL — ABNORMAL HIGH (ref 70–99)
Glucose-Capillary: 97 mg/dL (ref 70–99)
Glucose-Capillary: 92 mg/dL (ref 70–99)

## 2024-03-25 LAB — BASIC METABOLIC PANEL WITH GFR
Anion gap: 8 (ref 5–15)
BUN: 20 mg/dL (ref 8–23)
CO2: 25 mmol/L (ref 22–32)
Calcium: 7.7 mg/dL — ABNORMAL LOW (ref 8.9–10.3)
Chloride: 104 mmol/L (ref 98–111)
Creatinine, Ser: 0.67 mg/dL (ref 0.61–1.24)
GFR, Estimated: >60 mL/min (ref >60)
Glucose, Bld: 96 mg/dL (ref 70–99)
Potassium: 3.8 mmol/L (ref 3.5–5.1)
Sodium: 137 mmol/L (ref 135–145)

## 2024-03-25 LAB — CBC WITH DIFFERENTIAL/PLATELET
Abs Immature Granulocytes: 0.05 10*3/uL (ref 0.00–0.07)
Basophils Absolute: 0.1 10*3/uL (ref 0.0–0.1)
Basophils Relative: 1 %
Eosinophils Absolute: 0.2 10*3/uL (ref 0.0–0.5)
Eosinophils Relative: 3 %
HCT: 27.4 % — ABNORMAL LOW (ref 39.0–52.0)
Hemoglobin: 9 g/dL — ABNORMAL LOW (ref 13.0–17.0)
Immature Granulocytes: 1 %
Lymphocytes Relative: 22 %
Lymphs Abs: 1.7 10*3/uL (ref 0.7–4.0)
MCH: 28.8 pg (ref 26.0–34.0)
MCHC: 32.8 g/dL (ref 30.0–36.0)
MCV: 87.5 fL (ref 80.0–100.0)
Monocytes Absolute: 0.8 10*3/uL (ref 0.1–1.0)
Monocytes Relative: 10 %
Neutro Abs: 5 10*3/uL (ref 1.7–7.7)
Neutrophils Relative %: 63 %
Platelets: 512 10*3/uL — ABNORMAL HIGH (ref 150–400)
RBC: 3.13 MIL/uL — ABNORMAL LOW (ref 4.22–5.81)
RDW: 15.7 % — ABNORMAL HIGH (ref 11.5–15.5)
WBC: 7.8 10*3/uL (ref 4.0–10.5)
nRBC: 0 % (ref 0.0–0.2)

## 2024-03-25 NOTE — TOC Progression Note (Signed)
 Transition of Care [facility]) - Progression Note    Patient Details  Name: Earl [name] MRN: [id number] Date of Birth: 1941-10-14  [facility] CM/SW Contact  Tayden Nichelson A [place], LCSW Phone Number: 03/25/2024, 11:45 AM  Clinical Narrative:     CSW spoke with pt's daughter Para at bedside to discuss bed offer decision. She said that she had some questions and requested contact information for [facility].   CSW reached out to liaison and informed them of request for contact with family as well as provided information to family at bedside.   CSW also provided updated list of bed offers that included facilities that are unable to accept cone's LOG's as requested.   TOC leadership notified of conversation and families continued concerns regarding wound care and length of stay are rehab and financial concerns.    TOC will continue to follow.   Expected Discharge Plan: Skilled Nursing Facility Barriers to Discharge: Continued Medical Work up  Expected Discharge Plan and Services In-house Referral: Clinical Social Work     Living arrangements for the past 2 months:  (from [facility] short term rehab)                                       Social Determinants of Health (SDOH) Interventions SDOH Screenings   Food Insecurity: No Food Insecurity (03/05/2024)  Housing: Low Risk  (03/05/2024)  Transportation Needs: No Transportation Needs (03/05/2024)  Utilities: Not At Risk (03/05/2024)  Depression (PHQ2-9): Low Risk  (10/03/2023)  Financial Resource Strain: Patient Declined (01/16/2024)  Physical Activity: Unknown (01/16/2024)  Social Connections: Moderately Integrated (03/05/2024)  Stress: Stress Concern Present (01/16/2024)  Tobacco Use: Low Risk  (03/04/2024)    Readmission Risk Interventions    03/19/2024   10:24 AM  Readmission Risk Prevention Plan  Transportation Screening Complete  Medication Review (RN Care Manager) Complete  PCP or Specialist  appointment within 3-5 days of discharge Complete  HRI or Home Care Consult Complete  SW Recovery Care/Counseling Consult Complete  Palliative Care Screening Complete  Skilled Nursing Facility Complete

## 2024-03-25 NOTE — Progress Notes (Signed)
 Physical Therapy Treatment Patient Details Name: Earl [name] MRN: [id number] DOB: 1941/08/11 Today's Date: 03/25/2024   History of Present Illness 83 y.o. male brought to the ED 4/14 from home by EMS with SoB and sacral pain. Found to have acute metabolic encephalopathy, hypotensive, hypoglycemic. 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    PT Comments  Worked with pt on sitting tolerance and balance. Pt premedicated prior to session. Able to get to sitting EOB and tolerated 15 minutes of sitting. Expect progress to continue to be slow due to pain and debility. Once returned to sidelying noted stool and soiled sacral dresssing. Nursing in and performed pericare and dressing change.     If plan is discharge home, recommend the following: Two people to help with walking and/or transfers;Two people to help with bathing/dressing/bathroom;Assistance with feeding;Assist for transportation   Can travel by private vehicle     No  Equipment Recommendations  Hospital bed;Hoyer lift    Recommendations for Other Services       Precautions / Restrictions Precautions Precautions: Fall Recall of Precautions/Restrictions: Intact Precaution/Restrictions Comments: significant pain from pressure sore and generalized Restrictions Weight Bearing Restrictions Per Provider Order: No     Mobility  Bed Mobility Overal bed mobility: Needs Assistance Bed Mobility: Rolling, Supine to Sit, Sit to Sidelying Rolling: Max assist, +2 for safety/equipment, Used rails   Supine to sit: +2 for physical assistance, Max assist   Sit to sidelying: +2 for physical assistance, Max assist General bed mobility comments: Assist for all aspect    Transfers                         Ambulation/Gait                   Stairs             Wheelchair Mobility     Tilt Bed    Modified Rankin (Stroke Patients Only)       Balance Overall balance assessment: Needs assistance Sitting-balance support: Bilateral upper extremity supported, Feet supported Sitting balance-Leahy Scale: Poor Sitting balance - Comments: Pt sat EOB with Mod to CGA. Postural control: Posterior lean, Right lateral lean                                  Communication Communication Communication: Impaired Factors Affecting Communication: Difficulty expressing self;Reduced clarity of speech (low volume)  Cognition Arousal: Alert Behavior During Therapy: WFL for tasks assessed/performed   PT - Cognitive impairments: Problem solving, Memory, Initiation, Sequencing                         Following commands: Impaired Following commands impaired: Follows one step commands with increased time    Cueing Cueing Techniques: Verbal cues, Tactile cues  Exercises Other Exercises Other Exercises: Assisted reaching in sitting Other Exercises: Assisted knee flex/ext in sitting    General Comments        Pertinent Vitals/Pain Pain Assessment Pain Assessment: 0-10 Pain Score: 9  Pain Location: sacral wound and all over Pain Descriptors / Indicators: Burning, Grimacing, Guarding Pain Intervention(s): Limited activity within patient's tolerance, Monitored during session, Premedicated before session, Repositioned, Patient requesting pain meds-RN  notified    Home Living                          Prior Function            PT Goals (current goals can now be found in the care plan section) Progress towards PT goals: Progressing toward goals    Frequency    Min 1X/week      PT Plan      Co-evaluation              AM-PAC PT "6 Clicks" Mobility   Outcome Measure  Help needed turning from your back to your side while in a flat  bed without using bedrails?: A Lot Help needed moving from lying on your back to sitting on the side of a flat bed without using bedrails?: Total Help needed moving to and from a bed to a chair (including a wheelchair)?: Total Help needed standing up from a chair using your arms (e.g., wheelchair or bedside chair)?: Total Help needed to walk in hospital room?: Total Help needed climbing 3-5 steps with a railing? : Total 6 Click Score: 7    End of Session   Activity Tolerance: No increased pain Patient left: in bed;with call bell/phone within reach;with bed alarm set;with nursing/sitter in room Nurse Communication: Mobility status PT Visit Diagnosis: Muscle weakness (generalized) (M62.81);Adult, failure to thrive (R62.7);Difficulty in walking, not elsewhere classified (R26.2);Pain Pain - part of body:  (buttocks)     Time: 1610-9604 PT Time Calculation (min) (ACUTE ONLY): 40 min  Charges:    $Therapeutic Activity: 38-52 mins PT General Charges $$ ACUTE PT VISIT: 1 Visit                     [facility] PT Acute Rehabilitation Services Office [phone]    Pura Browns [facility] 03/25/2024, 4:43 PM

## 2024-03-25 NOTE — Progress Notes (Signed)
 [name]                                                                               Earl [name], is a 83 y.o. 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  He was just discharged from [facility] to [place] rehab 4 days ago.  Hospital course was remarkable for acute metabolic encephalopathy,  orthostatic hypotension, rhabdomyolysis, hypoglycemia.  On presentation, he was hypotensive, EKG showed A-fib with RVR, tachypnea but saturating fine on room air.  Respiratory panel negative for COVID.  PCCM consulted for persistent hypotension.  Cardiology also consulted.  General surgery consulted for debridement of sacral ulcer.    Status post bedside excisional debridement of the sacral wound on 4/15 and 4/17.  Wound care, cardiology, general surgery following.  No plan for further operative intervention for sacral ulcer.  Now hemodynamically stable.Prolonged hospitalization.   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 CT of the abdomen pelvis shows a sacral decubitus wound with extension in the right gluteus muscle with no signs of osteomyelitis. Seen by PCCM and started on broad-spectrum IV antibiotics, completed the course by 03/21/24. General surgery has been on board for the sacral ulcer, underwent  bedside excisional debridement of the sacral wound on 4/15 and 4/17.  General surgery reconsulted and recommended the wound does not need further debridement. He will need routine wound care .ID consulted and patient completed the course of antibiotics.  Continue with wound care and nutritional support. Sepsis physiology has resolved.  Patient has considerable pain while changing positions and with wound check and wound care.  Pain control with IV morphine  and oxygen contin 10 mg every 12 hours and oxycodone  2.5 mg every 4 hours prn.  Palliative on board for pain management and symptom management.    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 Pt has intermittent bouts of afib. Remains asymptomatic. Bouts of tachycardia probably from pain when changing the position or wound dressings.  Rate better controlled today with pain control.   History of HFrEF/transthyretin cardiac amyloidosis/NICM/hypotension  Entresto  and Toprol  on hold for hypotension. BP parameters stable on midodrine  5 mg TID. Continue with low dose lasix  as BP allows.  CXR shows chf, no pneumonia. An extra dose of 20 mg of lasix  given yesterday evening. Urine output has improved. Creatinine t his morning is stable. Discussed with daughter at bedside.  Outpatient follow-up with cardiology  Left lower lobe pneumonia  Completed the course of antibiotics.  Repeat CXR show CHF. No consolidation. Occasional cough and patient is on Mucinex .   Insulin -dependent diabetes mellitus type 2  Last A1c is 8.4 Continue with Semglee  15 units daily, novolog  4 units tidac and sliding scale insulin . CBG (last 3)  Recent Labs    03/25/24 0748 03/25/24 1149 03/25/24 1503  GLUCAP 119* 133* 97   Cbgs improving.  No changes in meds. Continue to monitor.   GERD  Stable.    Anemia of  chronic disease  S/p 1 unit of prbc transfusion.  Repeat hemoglobin around 9.  Continue with ferrous sulfate .   Hypothyroidism Continue Synthroid   Severe protein calorie malnutrition/suspicion for dysphagia Nutritionist and speech therapy on board Continue with nutritional supplementation Patient currently on pured diet and albumin  is 1.9 today.    Thrombocytosis:  Reactive. Monitor.    Hyperlipidemia Resume Lipitor 40 mg daily.   Diarrhea last week. Resolved.  Rectal tube removed.  Small BM yesterday.  On senna docusate 1 tab daily at bedtime.       Elderly deconditioned gentleman with recurrent hospitalizations, continues to decline has multiple medical issues.  Palliative care consulted and patient is currently a DNR Therapy evaluations have been ordered recommended SNF Patient is medically stable for discharge. Toc on board and plan for discharge when a bed becomes available.   RN Pressure Injury Documentation: Pressure Injury 02/26/24 Coccyx Mid Unstageable - Full thickness tissue loss in which the base of the injury is covered by slough (yellow, tan, gray, green or brown) and/or eschar (tan, brown or black) in the wound bed. (Active)  02/26/24 0916  Location: Coccyx  Location Orientation: Mid  Staging: Unstageable - Full thickness tissue loss in which the base of the injury is covered by slough (yellow, tan, gray, green or brown) and/or eschar (tan, brown or black) in the wound bed.  Wound Description (Comments):   Present on Admission: No  Dressing Type Foam - Lift dressing to assess site every shift 03/25/24 0800     Pressure Injury 02/26/24 Toe (Comment  which one) Anterior;Right Deep Tissue Pressure Injury - Purple or maroon localized area of discolored intact skin or blood-filled blister due to damage of underlying soft tissue from pressure and/or shear. right gre (Active)  02/26/24 1215  Location: Toe (Comment  which one)  Location Orientation:  Anterior;Right  Staging: Deep Tissue Pressure Injury - Purple or maroon localized area of discolored intact skin or blood-filled blister due to damage of underlying soft tissue from pressure and/or shear.  Wound Description (Comments): right great toe  Present on Admission:   Dressing Type None 03/25/24 0800     Pressure Injury 03/06/24 Hip Anterior;Left;Proximal Deep Tissue Pressure Injury - Purple or maroon localized area of discolored intact skin or blood-filled blister due to damage of underlying soft tissue from pressure and/or shear. Fluid filled blister w (Active)  03/06/24 1612  Location: Hip  Location Orientation: Anterior;Left;Proximal  Staging: Deep Tissue Pressure Injury - Purple or maroon localized area of discolored intact skin or blood-filled blister due to damage of underlying soft tissue from pressure and/or shear.  Wound Description (Comments): Fluid filled blister with purple discoloration  Present on Admission: No  Dressing Type Foam - Lift dressing to assess site every shift 03/25/24 0800     Pressure Injury 03/06/24 Shoulder Left Deep Tissue Pressure Injury - Purple or maroon localized area of discolored intact skin or blood-filled blister  due to damage of underlying soft tissue from pressure and/or shear. dark purple discoloration to should (Active)  03/06/24 1613  Location: Shoulder  Location Orientation: Left  Staging: Deep Tissue Pressure Injury - Purple or maroon localized area of discolored intact skin or blood-filled blister due to damage of underlying soft tissue from pressure and/or shear.  Wound Description (Comments): dark purple discoloration to shoulder  Present on Admission: No  Dressing Type Honey;Non adherent 03/25/24 0800   Wound care consulted and recommendations given.   Malnutrition Type:  Nutrition Problem: Severe Malnutrition Etiology: chronic illness   Malnutrition Characteristics:  Signs/Symptoms: energy intake < or equal to 75% for > or  equal to 1 month, severe muscle depletion   Nutrition Interventions:  Interventions: Ensure Enlive (each supplement provides 350kcal and 20 grams of protein), Magic cup, Education  Estimated body mass index is 23.15 kg/m as calculated from the following:   Height as of this encounter: 5\' 11"  (1.803 m).   Weight as of this encounter: 75.3 kg.  Code Status: DNR DVT Prophylaxis:  SCDs Start: 03/04/24 2151 Place TED hose Start: 03/04/24 2151 apixaban  (ELIQUIS ) tablet 5 mg   Level of Care: Level of care: Telemetry Medical Family Communication: Updated patient's daughter at bedside  Disposition Plan:     Remains inpatient appropriate: pending SNF.   Procedures:  Bedside debridement of the sacral decubitus ulcer  Consultants:   General surgery.  Infectious disease PCCM Wound care nurse Cardiology Palliative care  Antimicrobials:   Anti-infectives (From admission, onward)    Start     Dose/Rate Route Frequency Ordered Stop   03/16/24 1015  cefTRIAXone  (ROCEPHIN ) 2 g in sodium chloride  0.9 % 100 mL IVPB        2 g 200 mL/hr over 30 Minutes Intravenous Every 24 hours 03/16/24 0929 03/20/24 1219   03/16/24 1015  doxycycline  (VIBRA -TABS) tablet 100 mg        100 mg Oral Every 12 hours 03/16/24 0929 03/20/24 2131   03/14/24 1000  metroNIDAZOLE  (FLAGYL ) tablet 500 mg        500 mg Oral Every 12 hours 03/14/24 0803 03/19/24 2219   03/13/24 1000  cefadroxil  (DURICEF) capsule 1,000 mg  Status:  Discontinued        1,000 mg Oral 2 times daily 03/12/24 1632 03/16/24 0929   03/07/24 2200  metroNIDAZOLE  (FLAGYL ) tablet 500 mg        500 mg Oral Every 12 hours 03/07/24 0840 03/11/24 2258   03/05/24 1930  vancomycin  (VANCOREADY) IVPB 1250 mg/250 mL  Status:  Discontinued        1,250 mg 166.7 mL/hr over 90 Minutes Intravenous Every 24 hours 03/04/24 2205 03/04/24 2224   03/05/24 1930  Vancomycin  (VANCOCIN ) 1,250 mg in sodium chloride  0.9 % 250 mL IVPB  Status:  Discontinued        1,250  mg 166.7 mL/hr over 90 Minutes Intravenous Every 24 hours 03/04/24 2224 03/05/24 0717   03/05/24 1930  vancomycin  (VANCOREADY) IVPB 1500 mg/300 mL  Status:  Discontinued        1,500 mg 150 mL/hr over 120 Minutes Intravenous Every 24 hours 03/05/24 0717 03/12/24 1636   03/05/24 1400  ceFEPIme  (MAXIPIME ) 2 g in sodium chloride  0.9 % 100 mL IVPB  Status:  Discontinued        2 g 200 mL/hr over 30 Minutes Intravenous Every 8 hours 03/05/24 0717 03/12/24 1632   03/05/24 0600  ceFEPIme  (MAXIPIME ) 2 g in sodium chloride  0.9 %  100 mL IVPB  Status:  Discontinued        2 g 200 mL/hr over 30 Minutes Intravenous Every 12 hours 03/04/24 2205 03/05/24 0717   03/05/24 0000  metroNIDAZOLE  (FLAGYL ) IVPB 500 mg  Status:  Discontinued        500 mg 100 mL/hr over 60 Minutes Intravenous Every 12 hours 03/04/24 2206 03/04/24 2209   03/04/24 1730  aztreonam (AZACTAM) 2 g in sodium chloride  0.9 % 100 mL IVPB  Status:  Discontinued        2 g 200 mL/hr over 30 Minutes Intravenous  Once 03/04/24 1715 03/04/24 1724   03/04/24 1730  metroNIDAZOLE  (FLAGYL ) IVPB 500 mg  Status:  Discontinued        500 mg 100 mL/hr over 60 Minutes Intravenous Every 12 hours 03/04/24 1715 03/07/24 0840   03/04/24 1730  vancomycin  (VANCOREADY) IVPB 1500 mg/300 mL        1,500 mg 150 mL/hr over 120 Minutes Intravenous  Once 03/04/24 1715 03/04/24 2153   03/04/24 1730  ceFEPIme  (MAXIPIME ) 2 g in sodium chloride  0.9 % 100 mL IVPB        2 g 200 mL/hr over 30 Minutes Intravenous  Once 03/04/24 1724 03/04/24 1826        Medications  Scheduled Meds:  acetaminophen   500 mg Oral TID   amiodarone   200 mg Oral Daily   apixaban   5 mg Oral BID   ascorbic acid   500 mg Oral BID   atorvastatin   40 mg Oral Daily   Chlorhexidine  Gluconate Cloth  6 each Topical Daily   diclofenac  Sodium  4 g Topical QID   feeding supplement  237 mL Oral TID BM   ferrous sulfate   325 mg Oral QHS   furosemide   20 mg Oral Daily   guaiFENesin   600 mg Oral  BID   insulin  aspart  0-15 Units Subcutaneous TID WC   insulin  aspart  0-5 Units Subcutaneous QHS   insulin  aspart  4 Units Subcutaneous TID WC   insulin  glargine-yfgn  15 Units Subcutaneous Daily   levothyroxine   50 mcg Oral Q0600   liver oil-zinc  oxide   Topical BID   midodrine   5 mg Oral TID WC   nutrition supplement (JUVEN)  1 packet Oral BID BM   oxyCODONE   10 mg Oral Q12H   phosphorus  500 mg Oral TID   senna-docusate  1 tablet Oral QHS   sodium chloride  flush  3 mL Intravenous Q12H   Tafamidis   1 capsule Oral Daily   Continuous Infusions: PRN Meds:.acetaminophen  **OR** acetaminophen , food thickener, lip balm, morphine  injection, ondansetron  **OR** ondansetron  (ZOFRAN ) IV, oxyCODONE , polyethylene glycol, sodium chloride  flush    Subjective:   Tamika Kimmons was seen and examined today. No new complaints.   Objective:   Vitals:   03/25/24 0120 03/25/24 0455 03/25/24 0746 03/25/24 1503  BP: 108/62 107/63 (!) 109/57 110/63  Pulse: 77 76 76 77  Resp: 18 18    Temp: 98.2 F (36.8 C) 97.7 F (36.5 C) 98.4 F (36.9 C) 98.8 F (37.1 C)  TempSrc: Oral Oral Oral   SpO2: 99% 99% 99% 100%  Weight:      Height:        Intake/Output Summary (Last 24 hours) at 03/25/2024 1859 Last data filed at 03/25/2024 1600 Gross per 24 hour  Intake --  Output 1350 ml  Net -1350 ml   Filed Weights   03/04/24 1656 03/15/24 0500  Weight: 68.5 kg  75.3 kg     Exam General exam: Appears calm and comfortable  Respiratory system: Clear to auscultation. Respiratory effort normal. Cardiovascular system: S1 & S2 heard, RRR. No JVD Gastrointestinal system: Abdomen is nondistended, soft and nontender.  Central nervous system: Alert and oriented.  Extremities:pedal edema improving.  Skin: coccyx ulcer.  Psychiatry: Mood & affect appropriate.    Data Reviewed:  I have personally reviewed following labs and imaging studies   CBC Lab Results  Component Value Date   WBC 7.8 03/25/2024    RBC 3.13 (L) 03/25/2024   HGB 9.0 (L) 03/25/2024   HCT 27.4 (L) 03/25/2024   MCV 87.5 03/25/2024   MCH 28.8 03/25/2024   PLT 512 (H) 03/25/2024   MCHC 32.8 03/25/2024   RDW 15.7 (H) 03/25/2024   LYMPHSABS 1.7 03/25/2024   MONOABS 0.8 03/25/2024   EOSABS 0.2 03/25/2024   BASOSABS 0.1 03/25/2024     Last metabolic panel Lab Results  Component Value Date   NA 137 03/25/2024   K 3.8 03/25/2024   CL 104 03/25/2024   CO2 25 03/25/2024   BUN 20 03/25/2024   CREATININE 0.67 03/25/2024   GLUCOSE 96 03/25/2024   GFRNONAA >60 03/25/2024   GFRAA 77 05/17/2021   CALCIUM  7.7 (L) 03/25/2024   PHOS 3.6 03/23/2024   PROT 4.7 (L) 03/05/2024   ALBUMIN  1.9 (L) 03/23/2024   LABGLOB 2.4 02/22/2024   BILITOT 0.7 03/05/2024   ALKPHOS 72 03/05/2024   AST 41 03/05/2024   ALT 31 03/05/2024   ANIONGAP 8 03/25/2024    CBG (last 3)  Recent Labs    03/25/24 0748 03/25/24 1149 03/25/24 1503  GLUCAP 119* 133* 97      Coagulation Profile: No results for input(s): "INR", "PROTIME" in the last 168 hours.   Radiology Studies: No results found.      Feliciana Horn M.D. [name] 03/25/2024, 6:59 PM  Available via Epic secure chat 7am-7pm After 7 pm, please refer to night coverage provider listed on amion.

## 2024-03-25 NOTE — Progress Notes (Signed)
 Daily Progress Note   Patient Name: Earl [name]       Date: 03/25/2024 DOB: [birth year]  Age: 83 y.o. MRN#: [id number] Attending Physician: Feliciana Horn, MD Primary Care Physician: [name], MD Admit Date: 03/04/2024  Reason for Consultation/Follow-up: Establishing goals of care  Subjective: Medical records reviewed including progress notes, labs, imaging, MAR. Patient assessed at the bedside, grimacing slightly but in good spirits. No family present during my visit.  Patient shares that he just received personal care and thus his pain is a little worse at the moment. He received PRN IV morphine  in advance and he is engaging properly. We discussed the option of administering his PRN oxycodone  if pain is still severe but he declines for now. He is agreeable with continuing his current pain regimen.   Explored patient's goals of care and his feelings on his current illness. He confirms that he is still dedicating to fighting hard for himself to recover and get better. He understands that if he ever feels differently and wants to re-focus his care on comfort, the option is available. Emotional support and therapeutic listening was provided. He is tearful and very grateful for well wishes and prayers. Spiritual care consult was politely declines. I shared that I would be off service until next weekend.  Questions and concerns addressed. PMT will continue to support holistically.   Length of Stay: 21   Physical Exam Vitals and nursing note reviewed.  Constitutional:      General: He is not in acute distress.    Appearance: He is ill-appearing.  Cardiovascular:     Rate and Rhythm: Normal rate.  Pulmonary:     Effort: Pulmonary effort is normal.  Skin:    General: Skin is warm and dry.  Neurological:     Mental Status: He  is alert.  Psychiatric:        Behavior: Behavior normal. Behavior is cooperative.            Vital Signs: BP 110/63 (BP Location: Left Arm)   Pulse 77   Temp 98.8 F (37.1 C)   Resp 18   Ht 5\' 11"  (1.803 m)   Wt 75.3 kg   SpO2 100%   BMI 23.15 kg/m  SpO2: SpO2: 100 % O2 Device: O2 Device: Room Air O2 Flow Rate:        Palliative Assessment/Data: 20% to 30%   Palliative Care Assessment & Plan   Patient Profile: Per intake H&P --> Patient is a 83 year old male with history of paroxysmal A-fib, insulin -dependent diabetes, hypertension, hyperlipidemia, CVA with residual right-sided weakness, MGUS, GERD, BPH, hypothyroidism, HFrEF  with EF of 40 to 45%, chronic pancytopenia who presented to the emergency department from SNF with complaint of shortness of breath, wound, pain in the sacral area.    Palliative care evaluated [place] earlier this month and have been asked to get re-involved for additional goals of care conversations.   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 Recent COVID infection Severe protein calorie malnutrition  Recommendations/Plan: Continue DNR/DNI Continue current care plan Goals of care are clear; treat the treatable and encourage oral intake - NO PEG as this would not provide benefit Continue PRN IV morphine  and PRN oxycodone  for breakthrough pain Continue Oxycontin  10mg  BID Psychosocial and emotional support provided  Spiritual care consult politely declined PMT will continue to support as needed   Prognosis: Concerning prognosis in light of recurrent hospitalizations, declining functional/nutritional status, and several chronic comorbidities  Discharge Planning: Skilled Nursing Facility for rehab with Palliative care service follow-up   Care plan was discussed with patient   Teddie Mehta Alroy Jericho,  PA-C  Palliative Medicine Team Team phone # [phone]  Thank you for allowing the Palliative Medicine Team to assist in the care of this patient. Please utilize secure chat with additional questions, if there is no response within 30 minutes please call the above phone number.  Palliative Medicine Team providers are available by phone from 7am to 7pm daily and can be reached through the team cell phone.  Should this patient require assistance outside of these hours, please call the patient's attending physician.

## 2024-03-26 ENCOUNTER — Other Ambulatory Visit: Payer: Self-pay

## 2024-03-26 ENCOUNTER — Other Ambulatory Visit (HOSPITAL_COMMUNITY): Payer: Self-pay | Admitting: Cardiology

## 2024-03-26 ENCOUNTER — Encounter: Payer: Self-pay | Admitting: Podiatry

## 2024-03-26 ENCOUNTER — Encounter: Payer: Self-pay | Admitting: Neurology

## 2024-03-26 DIAGNOSIS — Z8679 Personal history of other diseases of the circulatory system: Secondary | ICD-10-CM | POA: Diagnosis not present

## 2024-03-26 DIAGNOSIS — I480 Paroxysmal atrial fibrillation: Secondary | ICD-10-CM | POA: Diagnosis not present

## 2024-03-26 DIAGNOSIS — D472 Monoclonal gammopathy: Secondary | ICD-10-CM | POA: Diagnosis not present

## 2024-03-26 DIAGNOSIS — A419 Sepsis, unspecified organism: Secondary | ICD-10-CM | POA: Diagnosis not present

## 2024-03-26 LAB — GLUCOSE, CAPILLARY
Glucose-Capillary: 102 mg/dL — ABNORMAL HIGH (ref 70–99)
Glucose-Capillary: 180 mg/dL — ABNORMAL HIGH (ref 70–99)
Glucose-Capillary: 106 mg/dL — ABNORMAL HIGH (ref 70–99)
Glucose-Capillary: 77 mg/dL (ref 70–99)

## 2024-03-26 NOTE — Progress Notes (Signed)
 Specialty Pharmacy Refill Coordination Note  Earl [name] is a 83 y.o. male contacted today regarding refills of specialty medication(s) Tafamidis    Patient requested Delivery   Delivery date: 04/01/24   Verified address: Patient address [address]  [place]   Medication will be filled on 03/29/24.  This fill date is pending response to refill request from provider. Patient is aware and if they have not received fill by intended date they must follow up with pharmacy.

## 2024-03-26 NOTE — Consult Note (Signed)
 WOC consulted for further discussion with family on wound care. Para at the bedside with her father. We discussed current care, improvement in the wounds. Discussed current co-morbid conditions that affect wound care including DM and nutritional status and mobility.  Need for offloading (specialty support surface).  We discussed variance in nursing and wound care techniques and the difference in wound care products based on manufacturer and contracts within facilities.  Recommended she advocate for similar wound care at the same frequency and in agreement with her this would be because his wound status is making progress on the sacrum. We discussed the trajectory of DTPI and how this may have been present before we actually saw any other signs in a person of color. Patient's daughter [name] is very pleasant to talk with and feel like we were able to present to the best of my ability accurate clinical information.  Time to heal is going to vary and I explained this could be different in an 83 year old with DM and poor nutrition versus an 83 year old that is eating and mobile. She verbalized understanding of all of our discussion.   Mahki Spikes [facility], CNS, [employer] [phone]

## 2024-03-26 NOTE — Progress Notes (Signed)
 Specialty Pharmacy Ongoing Clinical Assessment Note  Earl [name] is a 83 y.o. male who is being followed by the specialty pharmacy service for RxSp Cardiology   Patient's specialty medication(s) reviewed today: Tafamidis    Missed doses in the last 4 weeks: 0   Patient/Caregiver did not have any additional questions or concerns.   Therapeutic benefit summary: Patient is achieving benefit   Adverse events/side effects summary: No adverse events/side effects   Patient's therapy is appropriate to: Continue    Goals Addressed             This Visit's Progress    Stabilization of disease   On track    Patient is on track. Patient will maintain adherence. Per daughter patient is doing well on treatment.         Follow up:  6 months  Earl [name] Specialty Pharmacist

## 2024-03-26 NOTE — Progress Notes (Signed)
 [name]                                                                               Earl [name], is a 83 y.o. 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  He was just discharged from [facility] to [facility] rehab 4 days ago.  Hospital course was remarkable for acute metabolic encephalopathy,  orthostatic hypotension, rhabdomyolysis, hypoglycemia.  On presentation, he was hypotensive, EKG showed A-fib with RVR, tachypnea but saturating fine on room air.  Respiratory panel negative for COVID.  PCCM consulted for persistent hypotension.  Cardiology also consulted.  General surgery consulted for debridement of sacral ulcer.    Status post bedside excisional debridement of the sacral wound on 4/15 and 4/17.  Wound care, cardiology, general surgery following.  No plan for further operative intervention for sacral ulcer.  Now hemodynamically stable.Prolonged hospitalization.   No events overnight.  He is medically stable for discharge.    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 CT of the abdomen pelvis shows a sacral decubitus wound with extension in the right gluteus muscle with no signs of osteomyelitis. Seen by PCCM and started on broad-spectrum IV antibiotics, completed the course by 03/21/24. General surgery has  been on board for the sacral ulcer, underwent bedside excisional debridement of the sacral wound on 4/15 and 4/17.  General surgery reconsulted and recommended the wound does not need further debridement. He will need routine wound care .ID consulted and patient completed the course of antibiotics.  Continue with wound care and nutritional support. Sepsis physiology has resolved.  Patient has considerable pain while changing positions and with wound check and wound care.  Pain control with IV morphine  and oxygen contin 10 mg every 12 hours and oxycodone  2.5 mg every 4 hours prn.  Palliative on board for pain management and symptom management.    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 Pt has intermittent bouts of afib. Remains asymptomatic. Bouts of tachycardia probably from pain when changing the position or wound dressings.  Rate better controlled today with pain control.   History of HFrEF/transthyretin cardiac amyloidosis/NICM/hypotension  Entresto  and Toprol  on hold for hypotension. BP parameters stable on midodrine  5 mg TID. Continue with low dose lasix  as BP allows.  CXR shows chf, no pneumonia.  Outpatient follow-up with cardiology  Left lower lobe pneumonia  Completed the course of antibiotics.  Repeat CXR show  CHF. No consolidation. Occasional cough and patient is on Mucinex .   Insulin -dependent diabetes mellitus type 2  Last A1c is 8.4 Continue with Semglee  15 units daily, novolog  4 units tidac and sliding scale insulin . CBG (last 3)  Recent Labs    03/26/24 0741 03/26/24 1212 03/26/24 1624  GLUCAP 102* 180* 106*   Cbgs improving.  No changes in meds. Continue to monitor.   GERD  Stable.    Anemia of chronic disease  S/p 1 unit of prbc transfusion.  Repeat hemoglobin around 9.  Continue with ferrous  sulfate.   Hypothyroidism Continue Synthroid   Severe protein calorie malnutrition/suspicion for dysphagia Nutritionist and speech therapy on board Continue with nutritional supplementation Patient currently on pured diet and albumin  is 1.9 today.    Thrombocytosis:  Reactive. Monitor.    Hyperlipidemia Resume Lipitor 40 mg daily.   Diarrhea last week. Resolved.  Rectal tube removed.  Good BM yesterday and a small BM today.  On senna docusate 1 tab daily at bedtime.    Foley catheter was removed yesterday on 5/5 .  Male purewick was placed.    Elderly deconditioned gentleman with recurrent hospitalizations, continues to decline has multiple medical issues.  Palliative care consulted and patient is currently a DNR Therapy evaluations have been ordered recommended SNF Patient is medically stable for discharge. Toc on board and plan for discharge when a bed becomes available.   RN Pressure Injury Documentation: Pressure Injury 02/26/24 Coccyx Mid Unstageable - Full thickness tissue loss in which the base of the injury is covered by slough (yellow, tan, gray, green or brown) and/or eschar (tan, brown or black) in the wound bed. (Active)  02/26/24 0916  Location: Coccyx  Location Orientation: Mid  Staging: Unstageable - Full thickness tissue loss in which the base of the injury is covered by slough (yellow, tan, gray, green or brown) and/or eschar (tan, brown or black) in the wound bed.  Wound Description (Comments):   Present on Admission: No  Dressing Type Foam - Lift dressing to assess site every shift;Silver hydrofiber 03/25/24 2300     Pressure Injury 02/26/24 Toe (Comment  which one) Anterior;Right Deep Tissue Pressure Injury - Purple or maroon localized area of discolored intact skin or blood-filled blister due to damage of underlying soft tissue from pressure and/or shear. right gre (Active)  02/26/24 1215  Location: Toe (Comment  which one)  Location Orientation:  Anterior;Right  Staging: Deep Tissue Pressure Injury - Purple or maroon localized area of discolored intact skin or blood-filled blister due to damage of underlying soft tissue from pressure and/or shear.  Wound Description (Comments): right great toe  Present on Admission:   Dressing Type None 03/25/24 0800     Pressure Injury 03/06/24 Hip Anterior;Left;Proximal Deep Tissue Pressure Injury - Purple or maroon localized area of discolored intact skin or blood-filled blister due to damage of underlying soft tissue from pressure and/or shear. Fluid filled blister w (Active)  03/06/24 1612  Location: Hip  Location Orientation: Anterior;Left;Proximal  Staging: Deep Tissue Pressure Injury - Purple or maroon localized area of discolored intact skin or blood-filled blister due to damage of underlying soft tissue from pressure and/or shear.  Wound Description (Comments): Fluid filled blister with purple discoloration  Present on Admission: No  Dressing Type Foam - Lift dressing to assess site every shift 03/25/24 0800     Pressure Injury 03/06/24 Shoulder Left Deep Tissue Pressure Injury - Purple or maroon localized area of discolored intact skin or blood-filled blister  due to damage of underlying soft tissue from pressure and/or shear. dark purple discoloration to should (Active)  03/06/24 1613  Location: Shoulder  Location Orientation: Left  Staging: Deep Tissue Pressure Injury - Purple or maroon localized area of discolored intact skin or blood-filled blister due to damage of underlying soft tissue from pressure and/or shear.  Wound Description (Comments): dark purple discoloration to shoulder  Present on Admission: No  Dressing Type Honey;Non adherent 03/25/24 0800   Wound care consulted and recommendations given.   Malnutrition Type:  Nutrition Problem: Severe Malnutrition Etiology: chronic illness   Malnutrition Characteristics:  Signs/Symptoms: energy intake < or equal to 75% for > or  equal to 1 month, severe muscle depletion   Nutrition Interventions:  Interventions: Ensure Enlive (each supplement provides 350kcal and 20 grams of protein), Magic cup, Education  Estimated body mass index is 23.15 kg/m as calculated from the following:   Height as of this encounter: 5\' 11"  (1.803 m).   Weight as of this encounter: 75.3 kg.  Code Status: DNR DVT Prophylaxis:  SCDs Start: 03/04/24 2151 Place TED hose Start: 03/04/24 2151 apixaban  (ELIQUIS ) tablet 5 mg   Level of Care: Level of care: Telemetry Medical Family Communication: Updated patient's daughter at bedside  Disposition Plan:     Remains inpatient appropriate: pending SNF.   Procedures:  Bedside debridement of the sacral decubitus ulcer  Consultants:   General surgery.  Infectious disease PCCM Wound care nurse Cardiology Palliative care  Antimicrobials:   Anti-infectives (From admission, onward)    Start     Dose/Rate Route Frequency Ordered Stop   03/16/24 1015  cefTRIAXone  (ROCEPHIN ) 2 g in sodium chloride  0.9 % 100 mL IVPB        2 g 200 mL/hr over 30 Minutes Intravenous Every 24 hours 03/16/24 0929 03/20/24 1219   03/16/24 1015  doxycycline  (VIBRA -TABS) tablet 100 mg        100 mg Oral Every 12 hours 03/16/24 0929 03/20/24 2131   03/14/24 1000  metroNIDAZOLE  (FLAGYL ) tablet 500 mg        500 mg Oral Every 12 hours 03/14/24 0803 03/19/24 2219   03/13/24 1000  cefadroxil  (DURICEF) capsule 1,000 mg  Status:  Discontinued        1,000 mg Oral 2 times daily 03/12/24 1632 03/16/24 0929   03/07/24 2200  metroNIDAZOLE  (FLAGYL ) tablet 500 mg        500 mg Oral Every 12 hours 03/07/24 0840 03/11/24 2258   03/05/24 1930  vancomycin  (VANCOREADY) IVPB 1250 mg/250 mL  Status:  Discontinued        1,250 mg 166.7 mL/hr over 90 Minutes Intravenous Every 24 hours 03/04/24 2205 03/04/24 2224   03/05/24 1930  Vancomycin  (VANCOCIN ) 1,250 mg in sodium chloride  0.9 % 250 mL IVPB  Status:  Discontinued        1,250  mg 166.7 mL/hr over 90 Minutes Intravenous Every 24 hours 03/04/24 2224 03/05/24 0717   03/05/24 1930  vancomycin  (VANCOREADY) IVPB 1500 mg/300 mL  Status:  Discontinued        1,500 mg 150 mL/hr over 120 Minutes Intravenous Every 24 hours 03/05/24 0717 03/12/24 1636   03/05/24 1400  ceFEPIme  (MAXIPIME ) 2 g in sodium chloride  0.9 % 100 mL IVPB  Status:  Discontinued        2 g 200 mL/hr over 30 Minutes Intravenous Every 8 hours 03/05/24 0717 03/12/24 1632   03/05/24 0600  ceFEPIme  (MAXIPIME ) 2 g in sodium chloride  0.9 %  100 mL IVPB  Status:  Discontinued        2 g 200 mL/hr over 30 Minutes Intravenous Every 12 hours 03/04/24 2205 03/05/24 0717   03/05/24 0000  metroNIDAZOLE  (FLAGYL ) IVPB 500 mg  Status:  Discontinued        500 mg 100 mL/hr over 60 Minutes Intravenous Every 12 hours 03/04/24 2206 03/04/24 2209   03/04/24 1730  aztreonam (AZACTAM) 2 g in sodium chloride  0.9 % 100 mL IVPB  Status:  Discontinued        2 g 200 mL/hr over 30 Minutes Intravenous  Once 03/04/24 1715 03/04/24 1724   03/04/24 1730  metroNIDAZOLE  (FLAGYL ) IVPB 500 mg  Status:  Discontinued        500 mg 100 mL/hr over 60 Minutes Intravenous Every 12 hours 03/04/24 1715 03/07/24 0840   03/04/24 1730  vancomycin  (VANCOREADY) IVPB 1500 mg/300 mL        1,500 mg 150 mL/hr over 120 Minutes Intravenous  Once 03/04/24 1715 03/04/24 2153   03/04/24 1730  ceFEPIme  (MAXIPIME ) 2 g in sodium chloride  0.9 % 100 mL IVPB        2 g 200 mL/hr over 30 Minutes Intravenous  Once 03/04/24 1724 03/04/24 1826        Medications  Scheduled Meds:  acetaminophen   500 mg Oral TID   amiodarone   200 mg Oral Daily   apixaban   5 mg Oral BID   ascorbic acid   500 mg Oral BID   atorvastatin   40 mg Oral Daily   Chlorhexidine  Gluconate Cloth  6 each Topical Daily   diclofenac  Sodium  4 g Topical QID   feeding supplement  237 mL Oral TID BM   ferrous sulfate   325 mg Oral QHS   furosemide   20 mg Oral Daily   guaiFENesin   600 mg Oral  BID   insulin  aspart  0-15 Units Subcutaneous TID WC   insulin  aspart  0-5 Units Subcutaneous QHS   insulin  aspart  4 Units Subcutaneous TID WC   insulin  glargine-yfgn  15 Units Subcutaneous Daily   levothyroxine   50 mcg Oral Q0600   liver oil-zinc  oxide   Topical BID   midodrine   5 mg Oral TID WC   nutrition supplement (JUVEN)  1 packet Oral BID BM   oxyCODONE   10 mg Oral Q12H   phosphorus  500 mg Oral TID   senna-docusate  1 tablet Oral QHS   sodium chloride  flush  3 mL Intravenous Q12H   Tafamidis   1 capsule Oral Daily   Continuous Infusions: PRN Meds:.acetaminophen  **OR** acetaminophen , food thickener, lip balm, morphine  injection, ondansetron  **OR** ondansetron  (ZOFRAN ) IV, oxyCODONE , polyethylene glycol, sodium chloride  flush    Subjective:   Earl [name] was seen and examined today.  No new complaints.   Objective:   Vitals:   03/25/24 2003 03/26/24 0008 03/26/24 0739 03/26/24 1623  BP: 118/76 112/61 110/68 111/64  Pulse: 80 91 (!) 110 83  Resp: 18 18 19    Temp: (!) 97.5 F (36.4 C) 98 F (36.7 C) 98 F (36.7 C) 98.3 F (36.8 C)  TempSrc: Oral Oral Oral   SpO2: 100% 98% 99% 100%  Weight:      Height:        Intake/Output Summary (Last 24 hours) at 03/26/2024 1857 Last data filed at 03/26/2024 1017 Gross per 24 hour  Intake --  Output 1300 ml  Net -1300 ml   Filed Weights   03/04/24 1656 03/15/24 0500  Weight:  68.5 kg 75.3 kg     Exam General exam: ill appearing elderly gentleman, not in distress.  Respiratory system: Clear to auscultation. Respiratory effort normal. Cardiovascular system: S1 & S2 heard, RRR. No JVD,  Gastrointestinal system: Abdomen is nondistended, soft and nontender.  Central nervous system: Alert and oriented to person and place. Grossly non focal.  Extremities: pedal edema improving.  Skin: No rashes,  Psychiatry: Mood & affect appropriate.     Data Reviewed:  I have personally reviewed following labs and imaging  studies   CBC Lab Results  Component Value Date   WBC 7.8 03/25/2024   RBC 3.13 (L) 03/25/2024   HGB 9.0 (L) 03/25/2024   HCT 27.4 (L) 03/25/2024   MCV 87.5 03/25/2024   MCH 28.8 03/25/2024   PLT 512 (H) 03/25/2024   MCHC 32.8 03/25/2024   RDW 15.7 (H) 03/25/2024   LYMPHSABS 1.7 03/25/2024   MONOABS 0.8 03/25/2024   EOSABS 0.2 03/25/2024   BASOSABS 0.1 03/25/2024     Last metabolic panel Lab Results  Component Value Date   NA 137 03/25/2024   K 3.8 03/25/2024   CL 104 03/25/2024   CO2 25 03/25/2024   BUN 20 03/25/2024   CREATININE 0.67 03/25/2024   GLUCOSE 96 03/25/2024   GFRNONAA >60 03/25/2024   GFRAA 77 05/17/2021   CALCIUM  7.7 (L) 03/25/2024   PHOS 3.6 03/23/2024   PROT 4.7 (L) 03/05/2024   ALBUMIN  1.9 (L) 03/23/2024   LABGLOB 2.4 02/22/2024   BILITOT 0.7 03/05/2024   ALKPHOS 72 03/05/2024   AST 41 03/05/2024   ALT 31 03/05/2024   ANIONGAP 8 03/25/2024    CBG (last 3)  Recent Labs    03/26/24 0741 03/26/24 1212 03/26/24 1624  GLUCAP 102* 180* 106*      Coagulation Profile: No results for input(s): "INR", "PROTIME" in the last 168 hours.   Radiology Studies: No results found.      Feliciana Horn M.D. [name] 03/26/2024, 6:57 PM  Available via Epic secure chat 7am-7pm After 7 pm, please refer to night coverage provider listed on amion.

## 2024-03-26 NOTE — Plan of Care (Signed)
  Problem: Fluid Volume: Goal: Hemodynamic stability will improve Outcome: Progressing   Problem: Clinical Measurements: Goal: Diagnostic test results will improve Outcome: Progressing Goal: Signs and symptoms of infection will decrease Outcome: Progressing   Problem: Respiratory: Goal: Ability to maintain adequate ventilation will improve Outcome: Progressing   Problem: Education: Goal: Knowledge of risk factors and measures for prevention of condition will improve Outcome: Progressing   Problem: Coping: Goal: Psychosocial and spiritual needs will be supported Outcome: Progressing   Problem: Respiratory: Goal: Will maintain a patent airway Outcome: Progressing Goal: Complications related to the disease process, condition or treatment will be avoided or minimized Outcome: Progressing   Problem: Education: Goal: Ability to describe self-care measures that may prevent or decrease complications (Diabetes Survival Skills Education) will improve Outcome: Progressing Goal: Individualized Educational Video(s) Outcome: Progressing   Problem: Coping: Goal: Ability to adjust to condition or change in health will improve Outcome: Progressing   Problem: Fluid Volume: Goal: Ability to maintain a balanced intake and output will improve Outcome: Progressing   Problem: Health Behavior/Discharge Planning: Goal: Ability to identify and utilize available resources and services will improve Outcome: Progressing Goal: Ability to manage health-related needs will improve Outcome: Progressing   Problem: Metabolic: Goal: Ability to maintain appropriate glucose levels will improve Outcome: Progressing   Problem: Nutritional: Goal: Maintenance of adequate nutrition will improve Outcome: Progressing Goal: Progress toward achieving an optimal weight will improve Outcome: Progressing   Problem: Skin Integrity: Goal: Risk for impaired skin integrity will decrease Outcome: Progressing    Problem: Tissue Perfusion: Goal: Adequacy of tissue perfusion will improve Outcome: Progressing   Problem: Education: Goal: Knowledge of General Education information will improve Description: Including pain rating scale, medication(s)/side effects and non-pharmacologic comfort measures Outcome: Progressing   Problem: Health Behavior/Discharge Planning: Goal: Ability to manage health-related needs will improve Outcome: Progressing   Problem: Clinical Measurements: Goal: Ability to maintain clinical measurements within normal limits will improve Outcome: Progressing Goal: Will remain free from infection Outcome: Progressing Goal: Diagnostic test results will improve Outcome: Progressing Goal: Respiratory complications will improve Outcome: Progressing Goal: Cardiovascular complication will be avoided Outcome: Progressing   Problem: Activity: Goal: Risk for activity intolerance will decrease Outcome: Progressing   Problem: Nutrition: Goal: Adequate nutrition will be maintained Outcome: Progressing   Problem: Coping: Goal: Level of anxiety will decrease Outcome: Progressing   Problem: Elimination: Goal: Will not experience complications related to bowel motility Outcome: Progressing Goal: Will not experience complications related to urinary retention Outcome: Progressing   Problem: Pain Managment: Goal: General experience of comfort will improve and/or be controlled Outcome: Progressing   Problem: Safety: Goal: Ability to remain free from injury will improve Outcome: Progressing   Problem: Skin Integrity: Goal: Risk for impaired skin integrity will decrease Outcome: Progressing

## 2024-03-26 NOTE — Progress Notes (Signed)
 [facility] Liaison Note  03/26/2024  Earl [name] 12-02-1940 [id number]  Location: RN Hospital Liaison screened the patient remotely at [facility].  Insurance: [employer]   Earl [name] is a 83 y.o. male who is a Primary Care Patient of [name], MD  [facility] and Adult Medicine. The patient was screened for readmission hospitalization with noted extreme risk score for unplanned readmission risk with 1 IP/1 ED in 6 months.  The patient was assessed for potential Care Management service needs for post hospital transition for care coordination. Review of patient's electronic medical record reveals patient was admitted for Severe Sepsis. Pt recommended for SNF level of care for post hospital discharge. The suggested bed offer; the facility will continue to address pt's needs upon discharge.   [facility] Care Management/Population Health does not replace or interfere with any arrangements made by the Inpatient Transition of Care team.   For questions contact:   Lilla Reichert, RN, BSN Hospital Liaison [place]   [facility], Population Health Office Hours MTWF  8:00 am-6:00 pm Direct Dial: [phone] mobile [email or web address]

## 2024-03-27 ENCOUNTER — Encounter: Payer: Self-pay | Admitting: Family

## 2024-03-27 DIAGNOSIS — A419 Sepsis, unspecified organism: Secondary | ICD-10-CM | POA: Diagnosis not present

## 2024-03-27 DIAGNOSIS — R6520 Severe sepsis without septic shock: Secondary | ICD-10-CM | POA: Diagnosis not present

## 2024-03-27 LAB — GLUCOSE, CAPILLARY
Glucose-Capillary: 56 mg/dL — ABNORMAL LOW (ref 70–99)
Glucose-Capillary: 128 mg/dL — ABNORMAL HIGH (ref 70–99)
Glucose-Capillary: 268 mg/dL — ABNORMAL HIGH (ref 70–99)
Glucose-Capillary: 155 mg/dL — ABNORMAL HIGH (ref 70–99)
Glucose-Capillary: 130 mg/dL — ABNORMAL HIGH (ref 70–99)

## 2024-03-27 MED ORDER — INSULIN ASPART 100 UNIT/ML IJ SOLN
3.0000 [IU] | Freq: Three times a day (TID) | INTRAMUSCULAR | Status: DC
  Administered 2024-03-27 – 2024-03-28 (×3): 3 [IU] via SUBCUTANEOUS

## 2024-03-27 MED ORDER — INSULIN GLARGINE-YFGN 100 UNIT/ML ~~LOC~~ SOLN
5.0000 [IU] | Freq: Every day | SUBCUTANEOUS | Status: DC
  Administered 2024-03-27 – 2024-03-28 (×2): 5 [IU] via SUBCUTANEOUS
  Filled 2024-03-27 (×2): qty 0.05

## 2024-03-27 MED ORDER — INSULIN GLARGINE-YFGN 100 UNIT/ML ~~LOC~~ SOLN
10.0000 [IU] | Freq: Every day | SUBCUTANEOUS | Status: DC
  Filled 2024-03-27: qty 0.1

## 2024-03-27 MED ORDER — ADULT MULTIVITAMIN W/MINERALS CH
1.0000 | ORAL_TABLET | Freq: Every day | ORAL | Status: DC
  Administered 2024-03-27 – 2024-08-21 (×150): 1 via ORAL
  Filled 2024-03-27 (×153): qty 1

## 2024-03-27 NOTE — Progress Notes (Signed)
 Hypoglycemic Event  CBG: 56mg /dl at 1610  Treatment: 4 oz juice/soda  Symptoms: None  Follow-up CBG: Time:0937 am  CBG Result:128 mg/dl  Possible Reasons for Event: Inadequate meal intake  Comments/MD notified:yes     Earl [name]  Lenaduwa Lokuge

## 2024-03-27 NOTE — Progress Notes (Signed)
 Nutrition Follow-up  DOCUMENTATION CODES:   Severe malnutrition in context of chronic illness  INTERVENTION:   - Continue 1 packet Juven BID to support wound healing, each packet provides 95 calories, 2.5 grams of protein (collagen), and 9.8 grams of carbohydrate   - Continue Magic Cup TID with meals, each supplement provides 290 kcal and 9 grams of protein   - Continue Mighty Shake TID with meals, each supplement provides 330 kcals and 9 grams of protein   - Continue Ensure Enlive po TID, each supplement provides 350 kcal and 20 grams of protein.    - Continue vitamin C  500 mg BID x 30 days to support wound healing  - Add MVI with minerals daily  NUTRITION DIAGNOSIS:   Severe Malnutrition related to chronic illness as evidenced by energy intake < or equal to 75% for > or equal to 1 month, severe muscle depletion.  Ongoing, being addressed via oral nutrition supplements  GOAL:   Patient will meet greater than or equal to 90% of their needs  Progressing  MONITOR:   PO intake, Supplement acceptance, Weight trends, Skin  REASON FOR ASSESSMENT:   Consult Assessment of nutrition requirement/status  ASSESSMENT:   83 year old male who presented to the ED on 03/04/24 from [facility] due to sacral pain. PMH of T2DM, HTN, HLD, CHF, atrial fibrillation, stroke with right-sided weakness, GERD, BPH, hypothyroidism, MGUS, transthyretin associated amyloid cardiomyopathy, chronic pancytopenia, recent hospital admission for COVID-19. Pt admitted with sepsis secondary to sacral pressure injury infection.  Per MD note yesterday, pt is medically stable for discharge and awaiting SNF placement.  Met with pt at bedside. Both RN and NT in room providing juice as pt had just experienced a hypoglycemic episode. Pt had not yet eaten breakfast.  Pt reports being hungry and is eager to eat. Spoke with NT about higher calorie, higher protein options on pt's meal tray. NT starting to feed pt  breakfast and beginning with Magic Cup which pt states is "alright." Pt reports that he does enjoy the Ensure supplements and Mighty Shakes. Pt also with Juven ordered to support wound healing. Pt completed 14-day course of zinc  sulfate to support wound healing and remains on vitamin C . RD will also order daily MVI with minerals.  Per nursing edema assessment, pt with non-pitting generalized edema, non-pitting edema to BUE, and very deep pitting edema to BLE.  Meal Completion: 30-100% x last 7 documented meals, noted multiple meal completions on multiple days are missing  Medications reviewed and include: vitamin C  500 mg BID, Ensure Enlive TID, ferrous sulfate  325 mg daily, lasix  20 mg daily, SSI, Novolog  4 units TID with meals, Semglee  15 units daily, levothyroxine , Juven BID, K Phos  Neutral 500 mg TID, senna  Labs from 03/25/24 reviewed: hemoglobin 9.0 CBG's: 77-180 x 24 hours  UOP: 1650 mL x 24 hours  Diet Order:   Diet Order             DIET - DYS 1 Room service appropriate? Yes; Fluid consistency: Thin  Diet effective now                   EDUCATION NEEDS:   No education needs have been identified at this time  Skin:  Skin Assessment: Skin Integrity Issues: (per most recent WOC note) Skin Integrity Issues: DTI: L shoulder, great toe Stage II: L hip Unstageable: sacrum Other: wound to R heel, skin tear to L elbow  Last BM:  03/26/24 medium type 5  Height:  Ht Readings from Last 1 Encounters:  03/04/24 5\' 11"  (1.803 m)    Weight:   Wt Readings from Last 1 Encounters:  03/15/24 75.3 kg    BMI:  Body mass index is 23.15 kg/m.  Estimated Nutritional Needs:   Kcal:  2000-2200  Protein:  95-100 grams  Fluid:  >2.0 L    Ernestina Headland, MS, RD, LDN Registered Dietitian II Please see AMiON for contact information.

## 2024-03-27 NOTE — Progress Notes (Signed)
 Occupational Therapy Treatment Patient Details Name: Earl [name] MRN: [id number] DOB: June 24, 1941 Today's Date: 03/27/2024   History of present illness 83 y.o. male brought to the ED 4/14 from home by EMS with SoB and sacral pain. Found to have acute metabolic encephalopathy, hypotensive, hypoglycemic. 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   OT comments  Pt making slow progression toward goals, needing total A+2 to reposition in bed for optimal self-feeding positioning. Pt RHD, impaired grasp, but able to grasp green foam cube. Cut a hole in the middle of green foam cube to insert spoon, pt able to self feed 1/2 cup of applesauce with ~ minA holding cup in L hand and spoon in R hand, may benefit from trial of swivel spoon as pt has decr wrist ROM. Pt presenting with impairments listed below, will follow acutely. Patient will benefit from continued inpatient follow up therapy, <3 hours/day  to maximize safety/ind with ADL/functional mobility.       If plan is discharge home, recommend the following:  A lot of help with walking and/or transfers;A lot of help with bathing/dressing/bathroom;Assistance with cooking/housework;Assist for transportation;Help with stairs or ramp for entrance;Two people to help with walking and/or transfers   Equipment Recommendations  Other (comment) (defer)    Recommendations for Other Services      Precautions / Restrictions Precautions Precautions: Fall Recall of Precautions/Restrictions: Intact Precaution/Restrictions Comments: significant pain from pressure sore and generalized Restrictions Weight Bearing Restrictions Per Provider Order: No       Mobility Bed Mobility     Rolling: Total assist, +2 for physical assistance          General bed mobility comments: total +2 to better position in bed    Transfers                         Balance                                           ADL either performed or assessed with clinical judgement   ADL Overall ADL's : Needs assistance/impaired Eating/Feeding: Minimal assistance Eating/Feeding Details (indicate cue type and reason): minimal wrist support and positioning of build up spoon in hand                                        Extremity/Trunk Assessment Upper Extremity Assessment Upper Extremity Assessment: Right hand dominant RUE Deficits / Details: deficits from prior CVA, 2+/5 grasp, can being hand to mouth with incr time, keeps wrist flexed but can extend wrist when cued RUE Coordination: decreased fine motor;decreased gross motor LUE Deficits / Details: hx OA, decr grasp compard to RUE, able to hold cup steady wtih cylindrical grasp LUE Coordination: decreased fine motor;decreased gross motor   Lower Extremity Assessment Lower Extremity Assessment: Defer to PT evaluation        Vision   Vision Assessment?: No apparent visual deficits   Perception Perception Perception: Not tested   Praxis Praxis Praxis: Not tested   Communication Communication Communication: Impaired Factors Affecting Communication: Difficulty expressing self;Reduced clarity of speech  Cognition Arousal: Alert Behavior During Therapy: WFL for tasks assessed/performed Cognition: No apparent impairments             OT - Cognition Comments: pt slower processing but pleasant and follows directions well.                 Following commands: Impaired Following commands impaired: Follows one step commands with increased time      Cueing   Cueing Techniques: Verbal cues, Tactile cues  Exercises Other Exercises Other Exercises: assist pulling self off back of bed x2    Shoulder Instructions       General  Comments VSS    Pertinent Vitals/ Pain       Pain Assessment Pain Assessment: Faces Pain Score: 6  Faces Pain Scale: Hurts even more Pain Location: sacral wound and all over Pain Descriptors / Indicators: Burning, Grimacing, Guarding Pain Intervention(s): Limited activity within patient's tolerance, Monitored during session, Repositioned  Home Living                                          Prior Functioning/Environment              Frequency  Min 2X/week        Progress Toward Goals  OT Goals(current goals can now be found in the care plan section)  Progress towards OT goals: Progressing toward goals  Acute Rehab OT Goals Patient Stated Goal: none stated OT Goal Formulation: With patient/family Time For Goal Achievement: 03/25/24 Potential to Achieve Goals: Fair ADL Goals Pt Will Perform Eating: with supervision;with set-up;sitting;bed level;with caregiver independent in assisting Pt Will Perform Grooming: with supervision;with set-up;sitting;with caregiver independent in assisting;bed level Pt Will Perform Upper Body Dressing: with max assist;with mod assist;bed level Additional ADL Goal #1: pt will tolerate AA/PROM of B UEs in multiple planes to increase ROM/function and to decrease risk of joint tightness and skin breakdown  Plan      Co-evaluation                 AM-PAC OT "6 Clicks" Daily Activity     Outcome Measure   Help from another person eating meals?: A Lot Help from another person taking care of personal grooming?: A Lot Help from another person toileting, which includes using toliet, bedpan, or urinal?: Total Help from another person bathing (including washing, rinsing, drying)?: Total Help from another person to put on and taking off regular upper body clothing?: Total Help from another person to put on and taking off regular lower body clothing?: Total 6 Click Score: 8    End of Session    OT Visit Diagnosis: Other  abnormalities of gait and mobility (R26.89);Muscle weakness (generalized) (M62.81);History of falling (Z91.81)   Activity Tolerance Patient tolerated treatment well   Patient Left in bed;with call bell/phone within reach   Nurse Communication Mobility status        Time: 1125-1200 OT Time Calculation (min): 35 min  Charges: OT General Charges $OT Visit: 1 Visit OT Treatments $Self Care/Home Management : 23-37 mins  Donasia Wimes K, OTD, OTR/L SecureChat Preferred Acute Rehab [phone]   Tanush Drees K Koonce 03/27/2024, 1:27 PM

## 2024-03-27 NOTE — Inpatient Diabetes Management (Signed)
 Inpatient Diabetes Program Recommendations  AACE/ADA: New Consensus Statement on Inpatient Glycemic Control (2015)  Target Ranges:  Prepandial:   less than 140 mg/dL      Peak postprandial:   less than 180 mg/dL (1-2 hours)      Critically ill patients:  140 - 180 mg/dL   Lab Results  Component Value Date   GLUCAP 128 (H) 03/27/2024   HGBA1C 8.4 (H) 02/22/2024    Latest Reference Range & Units 03/26/24 07:41 03/26/24 12:12 03/26/24 16:24 03/26/24 20:06 03/27/24 08:55 03/27/24 09:37  Glucose-Capillary 70 - 99 mg/dL 914 (H) Novolog  4 units 180 (H) Novolog  7 units 106 (H) Novolog  4 units 77 56 (L) 128 (H)  (H): Data is abnormally high (L): Data is abnormally low  Diabetes history: DM2 Outpatient Diabetes medications: Metformin  1000 mg BID Current orders for Inpatient glycemic control: Semglee  5 units daily, Novolog  4 units tid meal coverage, Novolog  0-15 units TID with meals, Novolog  0-5 units QHS  Inpatient Diabetes Program Recommendations:   Please consider: -Decrease Novolog  correction to 0-9 units tid, 0-5 units hs -Decrease Novolog  meal coverage to 3 units tid if eats 50%  Thank you, Marily Shows E. Chevon Laufer, RN, MSN, CDCES  Diabetes Coordinator Inpatient Glycemic Control Team Team Pager [phone] (8am-5pm) 03/27/2024 10:42 AM

## 2024-03-27 NOTE — Plan of Care (Signed)
   Problem: Education: Goal: Knowledge of risk factors and measures for prevention of condition will improve Outcome: Progressing   Problem: Coping: Goal: Psychosocial and spiritual needs will be supported Outcome: Progressing

## 2024-03-27 NOTE — Progress Notes (Signed)
 [facility] PROGRESS NOTE    Progress Note  Pilot Nieland  KGM:[id number] DOB: 24-Aug-1941 DOA: 03/04/2024 PCP: [name], MD     Brief Narrative:   Earl [name] is an 83 y.o. 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  Critical care was consulted for persistent hypotension.  Respiratory panel was negative.  General surgery was consulted for sacral wound debridement with bedside excision on 03/05/2024 and 03/07/2024.  He is now hemodynamically stable.   Assessment/Plan:   Severe sepsis secondary to chronic infected pressure ulcers stage III: With pain and hypotension on admission. CT scan of the abdomen pelvis shows sacral decubitus ulcer extending to the right gluteus muscle with no evidence of osteomyelitis. Seen by PCCM started empirically on antibiotics which she has completed his course on 03/21/2024. General surgery is on board he underwent bedside excisional debridement on 03/05/2024 and 03/07/2024. They recommended no further debridement. ID was consulted to continue complete antibiotic treatment.  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  History of paroxysmal atrial fibrillation [facility]) Cardiology is consulted currently on amiodarone  and Eliquis . Patient has had intermittent bouts of A-fib but remains asymptomatic now rate controlled.  History of HFrEF/cardiac amyloid/nonischemic cardiomyopathy: Entresto  and metoprolol  held mission. Cardiology recommended to follow-up with them as an outpatient  continue low-dose Lasix .  Left lower lobe pneumonia: Completed course of antibiotics.  Insulin -dependent diabetes mellitus type 2: With an A1c of 8.4. Continue long-acting insulin  plus sliding scale.  GERD: Stable.  Anemia of chronic disease: Status post 1 unit packed red blood cells last hemoglobin was around 9.  Hypothyroidism: Synthroid .  Unstageable sacral decubitus ulcer present on admission RN Pressure Injury Documentation: Pressure Injury 02/26/24 Coccyx Mid Unstageable - Full thickness tissue loss in which the base of the injury is covered by slough (yellow, tan, gray, green or brown) and/or eschar (tan, brown or black) in the wound bed. (Active)  02/26/24 0916  Location: Coccyx  Location Orientation: Mid  Staging: Unstageable - Full thickness tissue loss in which the base of the injury is covered by slough (yellow, tan, gray, green or brown) and/or eschar (tan, brown or black) in the wound bed.  Wound Description (Comments):   Present on Admission: No  Dressing Type Hydrogel 03/26/24 1017     Pressure Injury 02/26/24 Toe (Comment  which one) Anterior;Right Deep Tissue Pressure Injury - Purple or maroon localized area of discolored intact skin or blood-filled blister due to damage of underlying soft tissue from pressure and/or shear. right gre (Active)  02/26/24 1215  Location: Toe (Comment  which one)  Location Orientation: Anterior;Right  Staging: Deep Tissue Pressure Injury - Purple or maroon localized area of discolored intact skin or blood-filled blister due to damage of underlying soft tissue from pressure and/or shear.  Wound Description (Comments): right great toe  Present on Admission:   Dressing Type None 03/25/24 0800     Pressure Injury 03/06/24 Hip Anterior;Left;Proximal Deep Tissue Pressure Injury - Purple or maroon localized area of discolored intact skin or blood-filled blister due to damage of underlying soft tissue from pressure and/or shear. Fluid  filled blister w (Active)  03/06/24  1612  Location: Hip  Location Orientation: Anterior;Left;Proximal  Staging: Deep Tissue Pressure Injury - Purple or maroon localized area of discolored intact skin or blood-filled blister due to damage of underlying soft tissue from pressure and/or shear.  Wound Description (Comments): Fluid filled blister with purple discoloration  Present on Admission: No  Dressing Type Foam - Lift dressing to assess site every shift 03/27/24 0511     Pressure Injury 03/06/24 Shoulder Left Deep Tissue Pressure Injury - Purple or maroon localized area of discolored intact skin or blood-filled blister due to damage of underlying soft tissue from pressure and/or shear. dark purple discoloration to should (Active)  03/06/24 1613  Location: Shoulder  Location Orientation: Left  Staging: Deep Tissue Pressure Injury - Purple or maroon localized area of discolored intact skin or blood-filled blister due to damage of underlying soft tissue from pressure and/or shear.  Wound Description (Comments): dark purple discoloration to shoulder  Present on Admission: No  Dressing Type Non adherent;Honey 03/27/24 0511   Severe protein malnutrition/suspicious for dysphagia: Speech was consulted, continue nutritional supplements. Patient on a pured diet albumin  appears to be improving. Continue Ensure.  DVT prophylaxis: lovenox  Family Communication:none Status is: Inpatient Remains inpatient appropriate because: Awaiting skilled nursing facility placement.    Code Status:     Code Status Orders  (From admission, onward)           Start     Ordered   03/04/24 2149  Do not attempt resuscitation (DNR)- Limited -Do Not Intubate (DNI)  Continuous       Question Answer Comment  If pulseless and not breathing No CPR or chest compressions.   In Pre-Arrest Conditions (Patient Is Breathing and Has A Pulse) Do not intubate. Provide all appropriate non-invasive medical interventions.  Avoid ICU transfer unless indicated or required.   Consent: Discussion documented in EHR or advanced directives reviewed      03/04/24 2150           Code Status History     Date Active Date Inactive Code Status Order ID Comments User Context   02/25/2024 1652 02/29/2024 1738 Limited: Do not attempt resuscitation (DNR) -DNR-LIMITED -Do Not Intubate/DNI  [id number]  Annette Killings, NP Inpatient   02/15/2024 0814 02/25/2024 1652 Full Code [id number]  Hoyt Macleod, MD ED   11/14/2023 2326 11/16/2023 2302 Full Code [id number]  Jetty Mort, MD ED   10/07/2021 1136 10/07/2021 1942 Full Code [id number]  Darlis Eisenmenger, MD Inpatient   07/28/2017 1834 08/08/2017 1437 Full Code [id number]  Zelda Hickman, PA-C Inpatient   07/28/2017 1834 07/28/2017 1834 Full Code [id number]  Zelda Hickman, PA-C Inpatient   07/26/2017 0109 07/28/2017 1828 Full Code [id number]  Pati Bonine, MD Inpatient         IV Access:   Peripheral IV   Procedures and diagnostic studies:   No results found.   Medical Consultants:   None.   Subjective:    Rice Buffkin he has no new complaints.  Objective:    Vitals:   03/26/24 1623 03/26/24 2006 03/26/24 2341 03/27/24 0503  BP: 111/64 114/75 124/71 123/71  Pulse: 83 80 83 86  Resp:  20 18 18   Temp: 98.3 F (36.8 C) 98.2 F (36.8 C) 98 F (36.7 C) (!) 97.5 F (36.4 C)  TempSrc:  Oral Oral   SpO2: 100% 99% 99% 100%  Weight:      Height:       SpO2: 100 %   Intake/Output  Summary (Last 24 hours) at 03/27/2024 0842 Last data filed at 03/27/2024 0511 Gross per 24 hour  Intake 3 ml  Output 1650 ml  Net -1647 ml   Filed Weights   03/04/24 1656 03/15/24 0500  Weight: 68.5 kg 75.3 kg    Exam: General exam: In no acute distress. Respiratory system: Good air movement and clear to auscultation. Cardiovascular system: S1 & S2 heard, RRR. No JVD  Gastrointestinal system: Abdomen is nondistended, soft and nontender.  Extremities: No pedal  edema. Skin: No rashes, lesions or ulcers Psychiatry: Judgement and insight appear normal. Mood & affect appropriate.    Data Reviewed:    Labs: Basic Metabolic Panel: Recent Labs  Lab 03/21/24 0859 03/23/24 0718 03/25/24 0514  NA 138 139 137  K 4.1 3.9 3.8  CL 105 103 104  CO2 25 25 25   GLUCOSE 172* 104* 96  BUN 16 18 20   CREATININE 0.74 0.71 0.67  CALCIUM  8.0* 8.2* 7.7*  PHOS  --  3.6  --    GFR Estimated Creatinine Clearance: 75.8 mL/min (by C-G formula based on SCr of 0.67 mg/dL). Liver Function Tests: Recent Labs  Lab 03/23/24 0718  ALBUMIN  1.9*   No results for input(s): "LIPASE", "AMYLASE" in the last 168 hours. No results for input(s): "AMMONIA" in the last 168 hours. Coagulation profile No results for input(s): "INR", "PROTIME" in the last 168 hours. COVID-19 Labs  No results for input(s): "DDIMER", "FERRITIN", "LDH", "CRP" in the last 72 hours.  Lab Results  Component Value Date   SARSCOV2NAA POSITIVE (A) 03/04/2024   SARSCOV2NAA POSITIVE (A) 02/14/2024   SARSCOV2NAA NEGATIVE 06/07/2019    CBC: Recent Labs  Lab 03/21/24 0859 03/25/24 0514  WBC 11.1* 7.8  NEUTROABS 8.2* 5.0  HGB 9.5* 9.0*  HCT 28.9* 27.4*  MCV 86.0 87.5  PLT 489* 512*   Cardiac Enzymes: No results for input(s): "CKTOTAL", "CKMB", "CKMBINDEX", "TROPONINI" in the last 168 hours. BNP (last 3 results) No results for input(s): "PROBNP" in the last 8760 hours. CBG: Recent Labs  Lab 03/25/24 2003 03/26/24 0741 03/26/24 1212 03/26/24 1624 03/26/24 2006  GLUCAP 92 102* 180* 106* 77   D-Dimer: No results for input(s): "DDIMER" in the last 72 hours. Hgb A1c: No results for input(s): "HGBA1C" in the last 72 hours. Lipid Profile: No results for input(s): "CHOL", "HDL", "LDLCALC", "TRIG", "CHOLHDL", "LDLDIRECT" in the last 72 hours. Thyroid  function studies: No results for input(s): "TSH", "T4TOTAL", "T3FREE", "THYROIDAB" in the last 72 hours.  Invalid input(s):  "FREET3" Anemia work up: No results for input(s): "VITAMINB12", "FOLATE", "FERRITIN", "TIBC", "IRON", "RETICCTPCT" in the last 72 hours. Sepsis Labs: Recent Labs  Lab 03/21/24 0859 03/25/24 0514  WBC 11.1* 7.8   Microbiology No results found for this or any previous visit (from the past 240 hours).   Medications:    acetaminophen   500 mg Oral TID   amiodarone   200 mg Oral Daily   apixaban   5 mg Oral BID   ascorbic acid   500 mg Oral BID   atorvastatin   40 mg Oral Daily   Chlorhexidine  Gluconate Cloth  6 each Topical Daily   diclofenac  Sodium  4 g Topical QID   feeding supplement  237 mL Oral TID BM   ferrous sulfate   325 mg Oral QHS   furosemide   20 mg Oral Daily   guaiFENesin   600 mg Oral BID   insulin  aspart  0-15 Units Subcutaneous TID WC   insulin  aspart  0-5 Units Subcutaneous QHS  insulin  aspart  4 Units Subcutaneous TID WC   insulin  glargine-yfgn  15 Units Subcutaneous Daily   levothyroxine   50 mcg Oral Q0600   liver oil-zinc  oxide   Topical BID   midodrine   5 mg Oral TID WC   nutrition supplement (JUVEN)  1 packet Oral BID BM   oxyCODONE   10 mg Oral Q12H   phosphorus  500 mg Oral TID   senna-docusate  1 tablet Oral QHS   sodium chloride  flush  3 mL Intravenous Q12H   Tafamidis   1 capsule Oral Daily   Continuous Infusions:    LOS: 23 days   Macdonald Savoy  [facility]  03/27/2024, 8:42 AM

## 2024-03-28 DIAGNOSIS — R6520 Severe sepsis without septic shock: Secondary | ICD-10-CM | POA: Diagnosis not present

## 2024-03-28 DIAGNOSIS — A419 Sepsis, unspecified organism: Secondary | ICD-10-CM | POA: Diagnosis not present

## 2024-03-28 LAB — GLUCOSE, CAPILLARY
Glucose-Capillary: 118 mg/dL — ABNORMAL HIGH (ref 70–99)
Glucose-Capillary: 169 mg/dL — ABNORMAL HIGH (ref 70–99)
Glucose-Capillary: 120 mg/dL — ABNORMAL HIGH (ref 70–99)
Glucose-Capillary: 159 mg/dL — ABNORMAL HIGH (ref 70–99)

## 2024-03-28 LAB — PHOSPHORUS: Phosphorus: 3.4 mg/dL (ref 2.5–4.6)

## 2024-03-28 MED ORDER — INSULIN GLARGINE-YFGN 100 UNIT/ML ~~LOC~~ SOLN
5.0000 [IU] | Freq: Every day | SUBCUTANEOUS | Status: DC
  Administered 2024-03-29 – 2024-03-30 (×2): 5 [IU] via SUBCUTANEOUS
  Filled 2024-03-28 (×2): qty 0.05

## 2024-03-28 MED ORDER — INSULIN ASPART 100 UNIT/ML IJ SOLN
4.0000 [IU] | Freq: Three times a day (TID) | INTRAMUSCULAR | Status: DC
  Administered 2024-03-28 – 2024-04-27 (×69): 4 [IU] via SUBCUTANEOUS

## 2024-03-28 MED ORDER — INSULIN GLARGINE-YFGN 100 UNIT/ML ~~LOC~~ SOLN: 7.0000 [IU] | Freq: Every day | SUBCUTANEOUS | Status: DC

## 2024-03-28 NOTE — Progress Notes (Signed)
 Physical Therapy Treatment Patient Details Name: Earl [name] MRN: [id number] DOB: 04-03-1941 Today's Date: 03/28/2024   History of Present Illness 83 y.o. male brought to the ED 4/14 from home by EMS with SoB and sacral pain. Found to have acute metabolic encephalopathy, hypotensive, hypoglycemic. 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    PT Comments  Pt admitted with above diagnosis. Pt met 0/5 goals due to multiple medical issues. Goals downgraded as pt with decline in mobiity due to wounds and sepsis.  Revised goals.  Pt was able to sit EOB needing total assist progressing to CGA.  Pt with rigid posture at times and needs incr assist for safety and due to decr endurance.  Will continue to follow acutely.  Pt currently with functional limitations due to the deficits listed below (see PT Problem List). Pt will benefit from acute skilled PT to increase their independence and safety with mobility to allow discharge.       If plan is discharge home, recommend the following: Two people to help with walking and/or transfers;Two people to help with bathing/dressing/bathroom;Assistance with feeding;Assist for transportation   Can travel by private vehicle     No  Equipment Recommendations  Hospital bed;Hoyer lift    Recommendations for Other Services       Precautions / Restrictions Precautions Precautions: Fall Recall of Precautions/Restrictions: Intact Precaution/Restrictions Comments: significant pain from pressure sore and generalized Restrictions Weight Bearing Restrictions Per Provider Order: No     Mobility  Bed Mobility Overal bed mobility: Needs Assistance Bed Mobility: Rolling, Supine to Sit, Sit to Sidelying Rolling: Total assist, +2 for physical  assistance Sidelying to sit: Max assist, HOB elevated, Used rails, +2 for physical assistance Supine to sit: +2 for physical assistance, Total assist Sit to supine: Total assist, +2 for physical assistance   General bed mobility comments: total +2 to better position in bed. Overall max to total assist for all mobiity as pt with rigid posture.    Transfers                   General transfer comment: NT as daughter states pt cant tolerate sitting in chair.    Ambulation/Gait                   Stairs             Wheelchair Mobility     Tilt Bed    Modified Rankin (Stroke Patients Only)       Balance Overall balance assessment: Needs assistance Sitting-balance support: Bilateral upper extremity supported, Feet supported Sitting balance-Leahy Scale: Poor Sitting balance - Comments: Pt sat EOB with Mod to CGA for a total of 15 min. Pt was able to hold balance with mod cues with CGA at least 3 x and could sit for 45seconds to a minute at a time.  Worked on some weight shifting left and right onto elbows as well to incr upright sitting. Postural control: Posterior lean, Right lateral lean                                  Communication Communication Communication: Impaired Factors Affecting Communication: Difficulty expressing self;Reduced clarity of speech  Cognition Arousal: Alert Behavior During Therapy: [facility] for  tasks assessed/performed   PT - Cognitive impairments: Problem solving, Memory, Initiation, Sequencing                       PT - Cognition Comments: slowed processing, impaired memory; pt attempting to follow commands as able, some anticipatory pain and difficulty switching between tasks due to perseveration on pain. Following commands: Impaired Following commands impaired: Follows one step commands with increased time    Cueing Cueing Techniques: Verbal cues, Tactile cues  Exercises General Exercises - Lower  Extremity Ankle Circles/Pumps: AROM, Both, 10 reps, Supine Heel Slides: AAROM, Both, 5 reps, Supine Other Exercises Other Exercises: Reviewed hand exercises Other Exercises: Could not perform LAQ when sitting EOB.    General Comments General comments (skin integrity, edema, etc.): VSS      Pertinent Vitals/Pain Pain Assessment Pain Assessment: Faces Faces Pain Scale: Hurts little more Breathing: normal Facial Expression: smiling or inexpressive Consolability: no need to console Pain Location: back, buttocks Pain Descriptors / Indicators: Grimacing Pain Intervention(s): Limited activity within patient's tolerance, Premedicated before session, Monitored during session, Repositioned    Home Living                          Prior Function            PT Goals (current goals can now be found in the care plan section) Acute Rehab PT Goals Patient Stated Goal: To improve strength and mobility to reduce risk for falls. PT Goal Formulation: With patient/family Time For Goal Achievement: 04/11/24 Potential to Achieve Goals: Fair Progress towards PT goals: Goals downgraded-see care plan    Frequency    Min 1X/week      PT Plan      Co-evaluation              AM-PAC PT "6 Clicks" Mobility   Outcome Measure  Help needed turning from your back to your side while in a flat bed without using bedrails?: A Lot Help needed moving from lying on your back to sitting on the side of a flat bed without using bedrails?: Total Help needed moving to and from a bed to a chair (including a wheelchair)?: Total Help needed standing up from a chair using your arms (e.g., wheelchair or bedside chair)?: Total Help needed to walk in hospital room?: Total Help needed climbing 3-5 steps with a railing? : Total 6 Click Score: 7    End of Session Equipment Utilized During Treatment: Other (comment) (bed pad assist) Activity Tolerance: Patient limited by fatigue Patient left: in  bed;with call bell/phone within reach;with family/visitor present;with bed alarm set Nurse Communication: Mobility status;Need for lift equipment PT Visit Diagnosis: Muscle weakness (generalized) (M62.81);Adult, failure to thrive (R62.7);Difficulty in walking, not elsewhere classified (R26.2);Pain Pain - part of body:  (buttocks)     Time: 4098-1191 PT Time Calculation (min) (ACUTE ONLY): 29 min  Charges:    $Therapeutic Exercise: 8-22 mins $Therapeutic Activity: 8-22 mins PT General Charges $$ ACUTE PT VISIT: 1 Visit                     Earl [name],PT Acute Rehab Services [phone]    Earl [name] 03/28/2024, 12:08 PM

## 2024-03-28 NOTE — Progress Notes (Signed)
 [facility] PROGRESS NOTE    Progress Note  Kaylen Flynt  WGN:[id number] DOB: 06/06/1941 DOA: 03/04/2024 PCP: [name], MD     Brief Narrative:   Earl [name] is an 83 y.o. male past medical history of paroxysmal atrial fibrillation on Eliquis , insulin -dependent diabetes mellitus type 2, essential hypertension, CVA with right residual weakness, MGUS, BPH, HFr EF of 40%, discharged, recently from [facility] for acute metabolic encephalopathy rhabdomyolysis and orthostatic, chronic pancytopenia presents to the ED from skilled nursing facility for shortness of breath sacral pain, in the ED was found to be in RVR, tachypneic with saturations greater than 90%.  Critical care was consulted for persistent hypotension.  Respiratory panel was negative.  General surgery was consulted for sacral wound debridement with bedside excision on 03/05/2024 and 03/07/2024.  He is now hemodynamically stable.  Assessment/Plan:  Severe sepsis secondary to chronic infected pressure ulcers stage III: With pain and hypotension on admission. CT scan of the abdomen pelvis shows sacral decubitus ulcer extending to the right gluteus muscle with no evidence of osteomyelitis. Seen by PCCM started empirically on antibiotics which she has completed his course on 03/21/2024. General surgery is on board he underwent bedside excisional debridement on 03/05/2024 and 03/07/2024. They recommended no further debridement. ID was consulted to continue complete antibiotic treatment.  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  See below for further details. I have explained to the daughter that this severe decubitus ulcer will take months to years to heal with good nutrition and good care, due to his comorbidities and nutritional status may be more.  Continue care per wound care instructions. Currently on no overlay mattress. Turn every 2 hours out of bed to chair, try if possible to set him  up in the chair daily. He has been eating about 20 to 50% of his meals. Ensure 3 times daily  History of paroxysmal atrial fibrillation [facility]) Cardiology is consulted currently on amiodarone  and Eliquis . Patient has had intermittent bouts of A-fib but remains asymptomatic now rate controlled.  History of HFrEF/cardiac amyloid/nonischemic cardiomyopathy: Entresto  and metoprolol  held mission. Cardiology recommended to follow-up with them as an outpatient continue low-dose Lasix . Blood pressure seems to be stable off Entresto  and metoprolol .  Left lower lobe pneumonia: Completed course of antibiotics.  Insulin -dependent diabetes mellitus type 2: With an A1c of 8.4. Continue long-acting insulin  plus sliding scale.  GERD: Stable.  Anemia of chronic disease: Status post 1 unit packed red blood cells last hemoglobin was around 9.  Hypothyroidism: Synthroid .  Stage 4 sacral decubitus ulcer present on admission RN Pressure Injury Documentation: Pressure Injury 02/26/24 Coccyx Mid Unstageable - Full thickness tissue loss in which the base of the injury is covered by slough (yellow, tan, gray, green or brown) and/or eschar (tan, brown or black) in the wound bed. (Active)  02/26/24 0916  Location: Coccyx  Location Orientation: Mid  Staging: Unstageable - Full thickness tissue loss in which the base of the injury is covered by slough (yellow, tan, gray, green or brown) and/or eschar (tan, brown or black) in the wound bed.  Wound Description (Comments):   Present on Admission: No  Dressing Type Foam - Lift dressing to assess site every shift 03/27/24 2130     Pressure Injury 02/26/24 Toe (Comment  which one) Anterior;Right Deep Tissue Pressure Injury - Purple or maroon localized area of discolored intact skin or blood-filled blister due to damage of underlying soft tissue from pressure and/or shear. right gre (Active)  02/26/24 1215  Location: Toe (Comment  which one)  Location  Orientation: Anterior;Right  Staging: Deep Tissue Pressure Injury - Purple or maroon localized area of discolored intact skin or blood-filled blister due to damage of underlying soft tissue from pressure and/or shear.  Wound Description (Comments): right great toe  Present on Admission:   Dressing Type None 03/27/24 1100     Pressure Injury 03/06/24 Hip Anterior;Left;Proximal Deep Tissue Pressure Injury - Purple or maroon localized area of discolored intact skin or blood-filled blister due to damage of underlying soft tissue from pressure and/or shear. Fluid filled blister w (Active)  03/06/24 1612  Location: Hip  Location Orientation: Anterior;Left;Proximal  Staging: Deep Tissue Pressure Injury - Purple or maroon localized area of discolored intact skin or blood-filled blister due to damage of underlying soft tissue from pressure and/or shear.  Wound Description (Comments): Fluid filled blister with purple discoloration  Present on Admission: No  Dressing Type Foam - Lift dressing to assess site every shift 03/27/24 1100     Pressure Injury 03/06/24 Shoulder Left Deep Tissue Pressure Injury - Purple or maroon localized area of discolored intact skin or blood-filled blister due to damage of underlying soft tissue from pressure and/or shear. dark purple discoloration to should (Active)  03/06/24 1613  Location: Shoulder  Location Orientation: Left  Staging: Deep Tissue Pressure Injury - Purple or maroon localized area of discolored intact skin or blood-filled blister due to damage of underlying soft tissue from pressure and/or shear.  Wound Description (Comments): dark purple discoloration to shoulder  Present on Admission: No  Dressing Type Non adherent 03/27/24 1100   Severe protein malnutrition/suspicious for dysphagia: Speech was consulted, continue nutritional supplements. Patient on a pured diet albumin  appears to be improving. Continue Ensure.  DVT prophylaxis: lovenox  Family  Communication:none Status is: Inpatient Remains inpatient appropriate because: Awaiting skilled nursing facility placement.    Code Status:     Code Status Orders  (From admission, onward)           Start     Ordered   03/04/24 2149  Do not attempt resuscitation (DNR)- Limited -Do Not Intubate (DNI)  Continuous       Question Answer Comment  If pulseless and not breathing No CPR or chest compressions.   In Pre-Arrest Conditions (Patient Is Breathing and Has A Pulse) Do not intubate. Provide all appropriate non-invasive medical interventions. Avoid ICU transfer unless indicated or required.   Consent: Discussion documented in EHR or advanced directives reviewed      03/04/24 2150           Code Status History     Date Active Date Inactive Code Status Order ID Comments User Context   02/25/2024 1652 02/29/2024 1738 Limited: Do not attempt resuscitation (DNR) -DNR-LIMITED -Do Not Intubate/DNI  [id number]  Annette Killings, NP Inpatient   02/15/2024 0814 02/25/2024 1652 Full Code [id number]  Hoyt Macleod, MD ED   11/14/2023 2326 11/16/2023 2302 Full Code [id number]  Jetty Mort, MD ED   10/07/2021 1136 10/07/2021 1942 Full Code [id number]  Darlis Eisenmenger, MD Inpatient   07/28/2017 1834 08/08/2017 1437 Full Code [id number]  Zelda Hickman, PA-C Inpatient   07/28/2017 1834 07/28/2017 1834 Full Code [id number]  Zelda Hickman, PA-C Inpatient   07/26/2017 0109 07/28/2017 1828 Full Code [id number]  Pati Bonine, MD Inpatient         IV Access:   Peripheral IV   Procedures and diagnostic studies:   No results found.  Medical Consultants:   None.   Subjective:    Lethaniel Rave complaining of right wrist pain  Objective:    Vitals:   03/27/24 2043 03/27/24 2339 03/28/24 0533 03/28/24 0724  BP: 117/69 125/74 114/66 120/63  Pulse: 75 80 74 77  Resp: 18 18 18 18   Temp: 98 F (36.7 C) 98.1 F (36.7 C) 98.3 F (36.8 C) 97.9 F (36.6 C)  TempSrc: Oral Oral  Oral Oral  SpO2: 100% 98% 98% 97%  Weight:      Height:       SpO2: 97 %   Intake/Output Summary (Last 24 hours) at 03/28/2024 1001 Last data filed at 03/28/2024 0500 Gross per 24 hour  Intake --  Output 2400 ml  Net -2400 ml   Filed Weights   03/04/24 1656 03/15/24 0500  Weight: 68.5 kg 75.3 kg    Exam: General exam: In no acute distress. Respiratory system: Good air movement and clear to auscultation. Cardiovascular system: S1 & S2 heard, RRR. No JVD. Gastrointestinal system: Abdomen is nondistended, soft and nontender.  Central nervous system: Alert and oriented. No focal neurological deficits. Extremities: Left wrist is not swollen, tender or erythematous to touch on examination to deep palpation is not tender he is able to move it without any difficulties. Skin: No rashes, lesions or ulcers   Data Reviewed:    Labs: Basic Metabolic Panel: Recent Labs  Lab 03/23/24 0718 03/25/24 0514  NA 139 137  K 3.9 3.8  CL 103 104  CO2 25 25  GLUCOSE 104* 96  BUN 18 20  CREATININE 0.71 0.67  CALCIUM  8.2* 7.7*  PHOS 3.6  --    GFR Estimated Creatinine Clearance: 75.8 mL/min (by C-G formula based on SCr of 0.67 mg/dL). Liver Function Tests: Recent Labs  Lab 03/23/24 0718  ALBUMIN  1.9*   No results for input(s): "LIPASE", "AMYLASE" in the last 168 hours. No results for input(s): "AMMONIA" in the last 168 hours. Coagulation profile No results for input(s): "INR", "PROTIME" in the last 168 hours. COVID-19 Labs  No results for input(s): "DDIMER", "FERRITIN", "LDH", "CRP" in the last 72 hours.  Lab Results  Component Value Date   SARSCOV2NAA POSITIVE (A) 03/04/2024   SARSCOV2NAA POSITIVE (A) 02/14/2024   SARSCOV2NAA NEGATIVE 06/07/2019    CBC: Recent Labs  Lab 03/25/24 0514  WBC 7.8  NEUTROABS 5.0  HGB 9.0*  HCT 27.4*  MCV 87.5  PLT 512*   Cardiac Enzymes: No results for input(s): "CKTOTAL", "CKMB", "CKMBINDEX", "TROPONINI" in the last 168 hours. BNP  (last 3 results) No results for input(s): "PROBNP" in the last 8760 hours. CBG: Recent Labs  Lab 03/27/24 0937 03/27/24 1206 03/27/24 1636 03/27/24 2044 03/28/24 0728  GLUCAP 128* 268* 155* 130* 118*   D-Dimer: No results for input(s): "DDIMER" in the last 72 hours. Hgb A1c: No results for input(s): "HGBA1C" in the last 72 hours. Lipid Profile: No results for input(s): "CHOL", "HDL", "LDLCALC", "TRIG", "CHOLHDL", "LDLDIRECT" in the last 72 hours. Thyroid  function studies: No results for input(s): "TSH", "T4TOTAL", "T3FREE", "THYROIDAB" in the last 72 hours.  Invalid input(s): "FREET3" Anemia work up: No results for input(s): "VITAMINB12", "FOLATE", "FERRITIN", "TIBC", "IRON", "RETICCTPCT" in the last 72 hours. Sepsis Labs: Recent Labs  Lab 03/25/24 0514  WBC 7.8   Microbiology No results found for this or any previous visit (from the past 240 hours).   Medications:    acetaminophen   500 mg Oral TID   amiodarone   200 mg Oral Daily  apixaban   5 mg Oral BID   ascorbic acid   500 mg Oral BID   atorvastatin   40 mg Oral Daily   Chlorhexidine  Gluconate Cloth  6 each Topical Daily   diclofenac  Sodium  4 g Topical QID   feeding supplement  237 mL Oral TID BM   ferrous sulfate   325 mg Oral QHS   furosemide   20 mg Oral Daily   guaiFENesin   600 mg Oral BID   insulin  aspart  0-15 Units Subcutaneous TID WC   insulin  aspart  0-5 Units Subcutaneous QHS   insulin  aspart  3 Units Subcutaneous TID WC   insulin  glargine-yfgn  5 Units Subcutaneous Daily   levothyroxine   50 mcg Oral Q0600   liver oil-zinc  oxide   Topical BID   midodrine   5 mg Oral TID WC   multivitamin with minerals  1 tablet Oral Daily   nutrition supplement (JUVEN)  1 packet Oral BID BM   oxyCODONE   10 mg Oral Q12H   phosphorus  500 mg Oral TID   senna-docusate  1 tablet Oral QHS   sodium chloride  flush  3 mL Intravenous Q12H   Tafamidis   1 capsule Oral Daily   Continuous Infusions:    LOS: 24 days    Macdonald Savoy  [facility]  03/28/2024, 10:01 AM

## 2024-03-28 NOTE — TOC Progression Note (Signed)
 Transition of Care [facility]) - Progression Note    Patient Details  Name: Earl [name] MRN: [id number] Date of Birth: [birth year]  Transition [facility] CM/SW Contact  Jaze Rodino A [place], LCSW Phone Number: 03/28/2024, 1:39 PM  Clinical Narrative:     CSW spoke with pt's daughter Para at bedside. She said that she had a list of questions to ask facility and was working on documentation, but hadn't heard back from [facility] yet so is undecided on bed. CSW to follow up and inform Crystal to reach back out to Para.  CSW to provide contact information to point of contact at [facility] to direct questions to them as well.   She said she is still communicating with her brother on decision but have not come to conclusion yet. She said that their family is also very busy due to spring holidays but are working to make a decision.   TOC will continue to follow.   Expected Discharge Plan: Skilled Nursing Facility Barriers to Discharge: Continued Medical Work up  Expected Discharge Plan and Services In-house Referral: Clinical Social Work     Living arrangements for the past 2 months:  (from [facility] short term rehab)                                       Social Determinants of Health (SDOH) Interventions SDOH Screenings   Food Insecurity: No Food Insecurity (03/05/2024)  Housing: Low Risk  (03/05/2024)  Transportation Needs: No Transportation Needs (03/05/2024)  Utilities: Not At Risk (03/05/2024)  Depression (PHQ2-9): Low Risk  (10/03/2023)  Financial Resource Strain: Patient Declined (01/16/2024)  Physical Activity: Unknown (01/16/2024)  Social Connections: Moderately Integrated (03/05/2024)  Stress: Stress Concern Present (01/16/2024)  Tobacco Use: Low Risk  (03/04/2024)    Readmission Risk Interventions    03/19/2024   10:24 AM  Readmission Risk Prevention Plan  Transportation Screening Complete  Medication Review (RN Care Manager) Complete  PCP or  Specialist appointment within 3-5 days of discharge Complete  HRI or Home Care Consult Complete  SW Recovery Care/Counseling Consult Complete  Palliative Care Screening Complete  Skilled Nursing Facility Complete

## 2024-03-28 NOTE — Consult Note (Signed)
 WOC Nurse wound follow up Wound type: Full thickness pressure injuries Measurement: Sacrum:  8 cm x 6 cm x 2 cm  Right great toe:  distal end  0.2 cm scabbed Left great toe  0.3 cm scabbed Left shoulder:  2 cm x 3 cm 100% slough Right heel:  1 cm x 1   Left hip:  5 cm x 7 cm x 0.2 cm  Wound bed: see above Drainage (amount, consistency, odor) minimal serosanguinous  no odor Periwound: intact  MASD to perineal skin.   Dressing procedure/placement/frequency: Continue all orders as written  Daughter at bedside and understands current wound care recommendations.  Continue to encourage PO intake, is on low air loss mattress.  Turning schedule and prompt skin care when incontinent  WOC team will see every 7-10 days.    Please re-consult if there are changes  Branda Cain MSN, RN, FNP-BC CWON Wound, Ostomy, Continence Nurse Outpatient [facility] [phone] Pager [phone]

## 2024-03-28 NOTE — Progress Notes (Signed)
 Speech Language Pathology Treatment: Dysphagia  Patient Details Name: Earl [name] MRN: [id number] DOB: [birth year] Today's Date: 03/28/2024 Time: 6295-2841 SLP Time Calculation (min) (ACUTE ONLY): 20 min  Assessment / Plan / Recommendation Clinical Impression  Earl [name] was participatory and communicative today. His daughter. Para, was at the bedside. He was repositioned to optimize safety and intake. With adaptive equipment, he fed himself a container of pudding, demonstrating timely oral preparation and overall excellent toleration. Sips of thin liquid from a straw revealed no concerns for aspiration. Review of chart shows clear breath sounds for several days.  Pt is doing well tolerating pureed diet/thin liquids with no concerns for aspiration at this time. Para prefers to maintain him on this diet since he is doing so well.  SLP will f/u early next week. Encouraged Para to reach out to our team with any questions/concerns pending our return.    HPI HPI: Patient is an 83 y.o. 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 He was discharged from the hospital (admitted for acute metabolic encephalopathy, hypotensive, hypoglycemic) to [facility] for rehab. He presented back to the hospital four days later on 03/04/24 with SOB and pain in sacral area. 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 SLP ordered due to suspicious for dysphagia.      SLP Plan  Continue with current plan of care      Recommendations for follow up therapy are one component of a multi-disciplinary discharge planning process, led by the attending physician.  Recommendations may be updated based on patient status, additional functional criteria and insurance authorization.     Recommendations  Diet recommendations: Dysphagia 1 (puree);Thin liquid Liquids provided via: Cup;Straw Medication Administration: Crushed with puree Supervision: Trained caregiver to feed patient Compensations: Slow rate;Small sips/bites;Use straw to facilitate chin tuck Postural Changes and/or Swallow Maneuvers: Seated upright 90 degrees;Upright 30-60 min after meal                  Oral care BID   Frequent or constant Supervision/Assistance Dysphagia, unspecified (R13.10)     Continue with current plan of care    Earl [name] L. Beatris Lincoln, MA CCC/SLP Clinical Specialist - Acute Care SLP Acute Rehabilitation Services Office number [phone]  [name]  03/28/2024, 10:56 AM

## 2024-03-28 NOTE — Plan of Care (Signed)
  Problem: Fluid Volume: Goal: Hemodynamic stability will improve Outcome: Progressing   Problem: Clinical Measurements: Goal: Diagnostic test results will improve Outcome: Progressing Goal: Signs and symptoms of infection will decrease Outcome: Progressing   Problem: Respiratory: Goal: Ability to maintain adequate ventilation will improve Outcome: Progressing   Problem: Education: Goal: Knowledge of risk factors and measures for prevention of condition will improve Outcome: Progressing   Problem: Coping: Goal: Psychosocial and spiritual needs will be supported Outcome: Progressing   Problem: Respiratory: Goal: Will maintain a patent airway Outcome: Progressing Goal: Complications related to the disease process, condition or treatment will be avoided or minimized Outcome: Progressing   Problem: Education: Goal: Ability to describe self-care measures that may prevent or decrease complications (Diabetes Survival Skills Education) will improve Outcome: Progressing Goal: Individualized Educational Video(s) Outcome: Progressing   Problem: Coping: Goal: Ability to adjust to condition or change in health will improve Outcome: Progressing   Problem: Fluid Volume: Goal: Ability to maintain a balanced intake and output will improve Outcome: Progressing   Problem: Health Behavior/Discharge Planning: Goal: Ability to identify and utilize available resources and services will improve Outcome: Progressing Goal: Ability to manage health-related needs will improve Outcome: Progressing   Problem: Metabolic: Goal: Ability to maintain appropriate glucose levels will improve Outcome: Progressing   Problem: Nutritional: Goal: Maintenance of adequate nutrition will improve Outcome: Progressing Goal: Progress toward achieving an optimal weight will improve Outcome: Progressing   Problem: Skin Integrity: Goal: Risk for impaired skin integrity will decrease Outcome: Progressing    Problem: Tissue Perfusion: Goal: Adequacy of tissue perfusion will improve Outcome: Progressing   Problem: Education: Goal: Knowledge of General Education information will improve Description: Including pain rating scale, medication(s)/side effects and non-pharmacologic comfort measures Outcome: Progressing   Problem: Health Behavior/Discharge Planning: Goal: Ability to manage health-related needs will improve Outcome: Progressing   Problem: Clinical Measurements: Goal: Ability to maintain clinical measurements within normal limits will improve Outcome: Progressing Goal: Will remain free from infection Outcome: Progressing Goal: Diagnostic test results will improve Outcome: Progressing Goal: Respiratory complications will improve Outcome: Progressing Goal: Cardiovascular complication will be avoided Outcome: Progressing   Problem: Activity: Goal: Risk for activity intolerance will decrease Outcome: Progressing   Problem: Nutrition: Goal: Adequate nutrition will be maintained Outcome: Progressing   Problem: Coping: Goal: Level of anxiety will decrease Outcome: Progressing   Problem: Elimination: Goal: Will not experience complications related to bowel motility Outcome: Progressing Goal: Will not experience complications related to urinary retention Outcome: Progressing   Problem: Pain Managment: Goal: General experience of comfort will improve and/or be controlled Outcome: Progressing   Problem: Safety: Goal: Ability to remain free from injury will improve Outcome: Progressing   Problem: Skin Integrity: Goal: Risk for impaired skin integrity will decrease Outcome: Progressing

## 2024-03-29 ENCOUNTER — Other Ambulatory Visit: Payer: Self-pay

## 2024-03-29 ENCOUNTER — Ambulatory Visit: Admitting: Nurse Practitioner

## 2024-03-29 DIAGNOSIS — A419 Sepsis, unspecified organism: Secondary | ICD-10-CM | POA: Diagnosis not present

## 2024-03-29 DIAGNOSIS — R6520 Severe sepsis without septic shock: Secondary | ICD-10-CM | POA: Diagnosis not present

## 2024-03-29 LAB — GLUCOSE, CAPILLARY
Glucose-Capillary: 183 mg/dL — ABNORMAL HIGH (ref 70–99)
Glucose-Capillary: 232 mg/dL — ABNORMAL HIGH (ref 70–99)
Glucose-Capillary: 144 mg/dL — ABNORMAL HIGH (ref 70–99)
Glucose-Capillary: 136 mg/dL — ABNORMAL HIGH (ref 70–99)

## 2024-03-29 MED ORDER — VYNDAMAX 61 MG PO CAPS
1.0000 | ORAL_CAPSULE | Freq: Every day | ORAL | 11 refills | Status: DC
  Filled 2024-03-29: qty 30, 30d supply, fill #0
  Filled 2024-04-22: qty 30, 30d supply, fill #1
  Filled 2024-05-13 – 2024-05-15 (×2): qty 30, 30d supply, fill #2
  Filled 2024-06-25: qty 30, 30d supply, fill #3
  Filled 2024-07-23: qty 30, 30d supply, fill #4
  Filled 2024-08-16: qty 30, 30d supply, fill #5

## 2024-03-29 NOTE — Progress Notes (Signed)
 [facility] PROGRESS NOTE    Progress Note  Earl [name]  ZOX:[id number] DOB: [birth year] DOA: 03/04/2024 PCP: [name], MD     Brief Narrative:   Earl [name] is an 83 y.o. male past medical history of paroxysmal atrial fibrillation on Eliquis , insulin -dependent diabetes mellitus type 2, essential hypertension, CVA with right residual weakness, MGUS, BPH, HFr EF of 40%, discharged, recently from [facility] for acute metabolic encephalopathy rhabdomyolysis and orthostatic, chronic pancytopenia presents to the ED from skilled nursing facility for shortness of breath sacral pain, in the ED was found to be in RVR, tachypneic with saturations greater than 90%.  Critical care was consulted for persistent hypotension.  Respiratory panel was negative.  General surgery was consulted for sacral wound debridement with bedside excision on 03/05/2024 and 03/07/2024.  He is now hemodynamically stable.  Assessment/Plan:  Severe sepsis secondary to chronic infected pressure ulcers stage III: With pain and hypotension on admission. CT scan of the abdomen pelvis shows sacral decubitus ulcer extending to the right gluteus muscle with no evidence of osteomyelitis. Seen by PCCM started empirically on antibiotics which she has completed his course on 03/21/2024. General surgery is on board he underwent bedside excisional debridement on 03/05/2024 and 03/07/2024. General surgery recommended no further debridement. ID was consulted to continue complete antibiotic treatment.  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  See below for further details. I have explained to the daughter that this severe decubitus ulcer will take months to years to heal with good nutrition and good care, due to his comorbidities and nutritional status may be more.  Continue care per wound care instructions. Currently on no overlay mattress. Turn every 2 hours out of bed to chair, try if possible  to set him up in the chair daily. He has been eating about 20 to 50% of his meals. Ensure 3 times daily  History of paroxysmal atrial fibrillation [facility]) Cardiology is consulted currently on amiodarone  and Eliquis . Patient has had intermittent bouts of A-fib but remains asymptomatic now rate controlled.  History of HFrEF/cardiac amyloid/nonischemic cardiomyopathy: Entresto  and metoprolol  held mission. Cardiology recommended to follow-up with them as an outpatient continue low-dose Lasix . Blood pressure seems to be stable off Entresto  and metoprolol .  Left lower lobe pneumonia: Completed course of antibiotics.  Insulin -dependent diabetes mellitus type 2: With an A1c of 8.4. Continue long-acting insulin  plus sliding scale.  GERD: Stable.  Anemia of chronic disease: Status post 1 unit packed red blood cells last hemoglobin was around 9.  Hypothyroidism: Synthroid .  Stage 4 sacral decubitus ulcer present on admission RN Pressure Injury Documentation: Pressure Injury 02/26/24 Coccyx Mid Unstageable - Full thickness tissue loss in which the base of the injury is covered by slough (yellow, tan, gray, green or brown) and/or eschar (tan, brown or black) in the wound bed. (Active)  02/26/24 0916  Location: Coccyx  Location Orientation: Mid  Staging: Unstageable - Full thickness tissue loss in which the base of the injury is covered by slough (yellow, tan, gray, green or brown) and/or eschar (tan, brown or black) in the wound bed.  Wound Description (Comments):   Present on Admission: No  Dressing Type Foam - Lift dressing to assess site every shift 03/27/24 2130     Pressure Injury 02/26/24 Toe (Comment  which one) Anterior;Right Deep Tissue Pressure Injury - Purple or maroon localized area of discolored intact skin or blood-filled blister due to damage of underlying soft tissue from pressure and/or shear. right gre (Active)  02/26/24  1215  Location: Toe (Comment  which one)  Location  Orientation: Anterior;Right  Staging: Deep Tissue Pressure Injury - Purple or maroon localized area of discolored intact skin or blood-filled blister due to damage of underlying soft tissue from pressure and/or shear.  Wound Description (Comments): right great toe  Present on Admission:   Dressing Type None 03/27/24 1100     Pressure Injury 03/06/24 Hip Anterior;Left;Proximal Deep Tissue Pressure Injury - Purple or maroon localized area of discolored intact skin or blood-filled blister due to damage of underlying soft tissue from pressure and/or shear. Fluid filled blister w (Active)  03/06/24 1612  Location: Hip  Location Orientation: Anterior;Left;Proximal  Staging: Deep Tissue Pressure Injury - Purple or maroon localized area of discolored intact skin or blood-filled blister due to damage of underlying soft tissue from pressure and/or shear.  Wound Description (Comments): Fluid filled blister with purple discoloration  Present on Admission: No  Dressing Type Foam - Lift dressing to assess site every shift 03/28/24 2100     Pressure Injury 03/06/24 Shoulder Left Deep Tissue Pressure Injury - Purple or maroon localized area of discolored intact skin or blood-filled blister due to damage of underlying soft tissue from pressure and/or shear. dark purple discoloration to should (Active)  03/06/24 1613  Location: Shoulder  Location Orientation: Left  Staging: Deep Tissue Pressure Injury - Purple or maroon localized area of discolored intact skin or blood-filled blister due to damage of underlying soft tissue from pressure and/or shear.  Wound Description (Comments): dark purple discoloration to shoulder  Present on Admission: No  Dressing Type Non adherent 03/27/24 1100   Severe protein malnutrition/suspicious for dysphagia: Speech was consulted, continue nutritional supplements. Patient on a pured diet albumin  appears to be improving. Continue Ensure.  DVT prophylaxis: lovenox  Family  Communication:none Status is: Inpatient Remains inpatient appropriate because: Awaiting skilled nursing facility placement.    Code Status:     Code Status Orders  (From admission, onward)           Start     Ordered   03/04/24 2149  Do not attempt resuscitation (DNR)- Limited -Do Not Intubate (DNI)  Continuous       Question Answer Comment  If pulseless and not breathing No CPR or chest compressions.   In Pre-Arrest Conditions (Patient Is Breathing and Has A Pulse) Do not intubate. Provide all appropriate non-invasive medical interventions. Avoid ICU transfer unless indicated or required.   Consent: Discussion documented in EHR or advanced directives reviewed      03/04/24 2150           Code Status History     Date Active Date Inactive Code Status Order ID Comments User Context   02/25/2024 1652 02/29/2024 1738 Limited: Do not attempt resuscitation (DNR) -DNR-LIMITED -Do Not Intubate/DNI  [id number]  Annette Killings, NP Inpatient   02/15/2024 0814 02/25/2024 1652 Full Code [id number]  Hoyt Macleod, MD ED   11/14/2023 2326 11/16/2023 2302 Full Code [id number]  Jetty Mort, MD ED   10/07/2021 1136 10/07/2021 1942 Full Code [id number]  Darlis Eisenmenger, MD Inpatient   07/28/2017 1834 08/08/2017 1437 Full Code [id number]  Zelda Hickman, PA-C Inpatient   07/28/2017 1834 07/28/2017 1834 Full Code [id number]  Zelda Hickman, PA-C Inpatient   07/26/2017 0109 07/28/2017 1828 Full Code [id number]  Pati Bonine, MD Inpatient         IV Access:   Peripheral IV   Procedures and diagnostic studies:   No results found.  Medical Consultants:   None.   Subjective:    Earl [name] has no new complaints this morning was to get up to the chair  Objective:    Vitals:   03/28/24 1215 03/28/24 1535 03/28/24 1948 03/29/24 0745  BP: 115/72 111/60 129/71 (!) 121/58  Pulse: 73 73 (!) 108 88  Resp: 18 18 16    Temp: 98.9 F (37.2 C) (!) 95.9 F (35.5 C) (!) 97.4 F (36.3  C) 98.3 F (36.8 C)  TempSrc: Oral  Oral   SpO2: 97% 99% 98% 100%  Weight:      Height:       SpO2: 100 %   Intake/Output Summary (Last 24 hours) at 03/29/2024 1148 Last data filed at 03/29/2024 0500 Gross per 24 hour  Intake --  Output 3350 ml  Net -3350 ml   Filed Weights   03/04/24 1656 03/15/24 0500  Weight: 68.5 kg 75.3 kg    Exam: General exam: In no acute distress. Respiratory system: Good air movement and clear to auscultation. Cardiovascular system: S1 & S2 heard, RRR. No JVD. Gastrointestinal system: Abdomen is nondistended, soft and nontender.  Central nervous system: Alert and oriented. No focal neurological deficits. Extremities: Left wrist is not swollen, tender or erythematous to touch on examination to deep palpation is not tender he is able to move it without any difficulties. Skin: No rashes, lesions or ulcers   Data Reviewed:    Labs: Basic Metabolic Panel: Recent Labs  Lab 03/23/24 0718 03/25/24 0514 03/28/24 1210  NA 139 137  --   K 3.9 3.8  --   CL 103 104  --   CO2 25 25  --   GLUCOSE 104* 96  --   BUN 18 20  --   CREATININE 0.71 0.67  --   CALCIUM  8.2* 7.7*  --   PHOS 3.6  --  3.4   GFR Estimated Creatinine Clearance: 75.8 mL/min (by C-G formula based on SCr of 0.67 mg/dL). Liver Function Tests: Recent Labs  Lab 03/23/24 0718  ALBUMIN  1.9*   No results for input(s): "LIPASE", "AMYLASE" in the last 168 hours. No results for input(s): "AMMONIA" in the last 168 hours. Coagulation profile No results for input(s): "INR", "PROTIME" in the last 168 hours. COVID-19 Labs  No results for input(s): "DDIMER", "FERRITIN", "LDH", "CRP" in the last 72 hours.  Lab Results  Component Value Date   SARSCOV2NAA POSITIVE (A) 03/04/2024   SARSCOV2NAA POSITIVE (A) 02/14/2024   SARSCOV2NAA NEGATIVE 06/07/2019    CBC: Recent Labs  Lab 03/25/24 0514  WBC 7.8  NEUTROABS 5.0  HGB 9.0*  HCT 27.4*  MCV 87.5  PLT 512*   Cardiac Enzymes: No  results for input(s): "CKTOTAL", "CKMB", "CKMBINDEX", "TROPONINI" in the last 168 hours. BNP (last 3 results) No results for input(s): "PROBNP" in the last 8760 hours. CBG: Recent Labs  Lab 03/28/24 0728 03/28/24 1218 03/28/24 1526 03/28/24 2054 03/29/24 0744  GLUCAP 118* 169* 120* 159* 183*   D-Dimer: No results for input(s): "DDIMER" in the last 72 hours. Hgb A1c: No results for input(s): "HGBA1C" in the last 72 hours. Lipid Profile: No results for input(s): "CHOL", "HDL", "LDLCALC", "TRIG", "CHOLHDL", "LDLDIRECT" in the last 72 hours. Thyroid  function studies: No results for input(s): "TSH", "T4TOTAL", "T3FREE", "THYROIDAB" in the last 72 hours.  Invalid input(s): "FREET3" Anemia work up: No results for input(s): "VITAMINB12", "FOLATE", "FERRITIN", "TIBC", "IRON", "RETICCTPCT" in the last 72 hours. Sepsis Labs: Recent Labs  Lab 03/25/24 [phone]  WBC 7.8   Microbiology No results found for this or any previous visit (from the past 240 hours).   Medications:    acetaminophen   500 mg Oral TID   amiodarone   200 mg Oral Daily   apixaban   5 mg Oral BID   ascorbic acid   500 mg Oral BID   atorvastatin   40 mg Oral Daily   Chlorhexidine  Gluconate Cloth  6 each Topical Daily   diclofenac  Sodium  4 g Topical QID   feeding supplement  237 mL Oral TID BM   ferrous sulfate   325 mg Oral QHS   furosemide   20 mg Oral Daily   guaiFENesin   600 mg Oral BID   insulin  aspart  0-15 Units Subcutaneous TID WC   insulin  aspart  0-5 Units Subcutaneous QHS   insulin  aspart  4 Units Subcutaneous TID WC   insulin  glargine-yfgn  5 Units Subcutaneous Daily   levothyroxine   50 mcg Oral Q0600   liver oil-zinc  oxide   Topical BID   midodrine   5 mg Oral TID WC   multivitamin with minerals  1 tablet Oral Daily   nutrition supplement (JUVEN)  1 packet Oral BID BM   oxyCODONE   10 mg Oral Q12H   phosphorus  500 mg Oral TID   senna-docusate  1 tablet Oral QHS   sodium chloride  flush  3 mL  Intravenous Q12H   Tafamidis   1 capsule Oral Daily   Continuous Infusions:    LOS: 25 days   Macdonald Savoy  [facility]  03/29/2024, 11:48 AM

## 2024-03-29 NOTE — Plan of Care (Signed)
   Problem: Fluid Volume: Goal: Hemodynamic stability will improve Outcome: Progressing   Problem: Clinical Measurements: Goal: Diagnostic test results will improve Outcome: Progressing Goal: Signs and symptoms of infection will decrease Outcome: Progressing   Problem: Respiratory: Goal: Ability to maintain adequate ventilation will improve Outcome: Progressing

## 2024-03-30 DIAGNOSIS — A419 Sepsis, unspecified organism: Secondary | ICD-10-CM | POA: Diagnosis not present

## 2024-03-30 DIAGNOSIS — R6520 Severe sepsis without septic shock: Secondary | ICD-10-CM | POA: Diagnosis not present

## 2024-03-30 LAB — GLUCOSE, CAPILLARY
Glucose-Capillary: 219 mg/dL — ABNORMAL HIGH (ref 70–99)
Glucose-Capillary: 180 mg/dL — ABNORMAL HIGH (ref 70–99)
Glucose-Capillary: 109 mg/dL — ABNORMAL HIGH (ref 70–99)
Glucose-Capillary: 162 mg/dL — ABNORMAL HIGH (ref 70–99)

## 2024-03-30 MED ORDER — INSULIN GLARGINE-YFGN 100 UNIT/ML ~~LOC~~ SOLN
7.0000 [IU] | Freq: Every day | SUBCUTANEOUS | Status: DC
  Administered 2024-03-31 – 2024-04-29 (×30): 7 [IU] via SUBCUTANEOUS
  Filled 2024-03-30 (×32): qty 0.07

## 2024-03-30 NOTE — Progress Notes (Signed)
 [facility] PROGRESS NOTE    Progress Note  Earl [name]  WRU:[id number] DOB: 10-Feb-1941 DOA: 03/04/2024 PCP: [name], MD     Brief Narrative:   Earl [name] is an 83 y.o. male past medical history of paroxysmal atrial fibrillation on Eliquis , insulin -dependent diabetes mellitus type 2, essential hypertension, CVA with right residual weakness, MGUS, BPH, HFr EF of 40%, discharged, recently from [facility] for acute metabolic encephalopathy rhabdomyolysis and orthostatic, chronic pancytopenia presents to the ED from skilled nursing facility for shortness of breath sacral pain, in the ED was found to be in RVR, tachypneic with saturations greater than 90%.  Critical care was consulted for persistent hypotension.  Respiratory panel was negative.  General surgery was consulted for sacral wound debridement with bedside excision on 03/05/2024 and 03/07/2024.  He is now hemodynamically stable for discharge.  Assessment/Plan:  Severe sepsis secondary to chronic infected pressure ulcers stage III: With pain and hypotension on admission. CT scan of the abdomen pelvis shows sacral decubitus ulcer extending to the right gluteus muscle with no evidence of osteomyelitis. Seen by PCCM started empirically on antibiotics which she has completed his course on 03/21/2024. General surgery is on board he underwent bedside excisional debridement on 03/05/2024 and 03/07/2024. General surgery recommended no further debridement. ID was consulted to continue complete antibiotic treatment in house.  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  See below for further details. I have explained to the daughter that this severe decubitus ulcer will take months to years to heal with good nutrition and good care, due to his comorbidities and nutritional status may be more.  Continue care per wound care instructions. Currently on no overlay mattress. Turn every 2 hours out of bed to  chair, try if possible to set him up in the chair daily. He has been eating about 20 to 50% of his meals. Ensure 3 times daily.  History of paroxysmal atrial fibrillation [facility]) Cardiology is consulted currently on amiodarone  and Eliquis . Patient has had intermittent bouts of A-fib but remains asymptomatic now rate controlled.  History of HFrEF/cardiac amyloid/nonischemic cardiomyopathy: Entresto  and metoprolol  held mission. Cardiology recommended to follow-up with them as an outpatient continue low-dose Lasix . Blood pressure seems to be stable off Entresto  and metoprolol .  Left lower lobe pneumonia: Completed course of antibiotics.  Insulin -dependent diabetes mellitus type 2: With an A1c of 8.4. Continue long-acting insulin  plus sliding scale.  GERD: Stable.  Anemia of chronic disease: Status post 1 unit packed red blood cells last hemoglobin was around 9.  Hypothyroidism: Synthroid .  Stage 4 sacral decubitus ulcer present on admission RN Pressure Injury Documentation: Pressure Injury 02/26/24 Coccyx Mid Unstageable - Full thickness tissue loss in which the base of the injury is covered by slough (yellow, tan, gray, green or brown) and/or eschar (tan, brown or black) in the wound bed. (Active)  02/26/24 0916  Location: Coccyx  Location Orientation: Mid  Staging: Unstageable - Full thickness tissue loss in which the base of the injury is covered by slough (yellow, tan, gray, green or brown) and/or eschar (tan, brown or black) in the wound bed.  Wound Description (Comments):   Present on Admission: No  Dressing Type Foam - Lift dressing to assess site every shift 03/29/24 2100     Pressure Injury 02/26/24 Toe (Comment  which one) Anterior;Right Deep Tissue Pressure Injury - Purple or maroon localized area of discolored intact skin or blood-filled blister due to damage of underlying soft tissue from pressure and/or shear. right  gre (Active)  02/26/24 1215  Location: Toe  (Comment  which one)  Location Orientation: Anterior;Right  Staging: Deep Tissue Pressure Injury - Purple or maroon localized area of discolored intact skin or blood-filled blister due to damage of underlying soft tissue from pressure and/or shear.  Wound Description (Comments): right great toe  Present on Admission:   Dressing Type Foam - Lift dressing to assess site every shift 03/29/24 2100     Pressure Injury 03/06/24 Hip Anterior;Left;Proximal Deep Tissue Pressure Injury - Purple or maroon localized area of discolored intact skin or blood-filled blister due to damage of underlying soft tissue from pressure and/or shear. Fluid filled blister w (Active)  03/06/24 1612  Location: Hip  Location Orientation: Anterior;Left;Proximal  Staging: Deep Tissue Pressure Injury - Purple or maroon localized area of discolored intact skin or blood-filled blister due to damage of underlying soft tissue from pressure and/or shear.  Wound Description (Comments): Fluid filled blister with purple discoloration  Present on Admission: No  Dressing Type Foam - Lift dressing to assess site every shift 03/29/24 2100     Pressure Injury 03/06/24 Shoulder Left Deep Tissue Pressure Injury - Purple or maroon localized area of discolored intact skin or blood-filled blister due to damage of underlying soft tissue from pressure and/or shear. dark purple discoloration to should (Active)  03/06/24 1613  Location: Shoulder  Location Orientation: Left  Staging: Deep Tissue Pressure Injury - Purple or maroon localized area of discolored intact skin or blood-filled blister due to damage of underlying soft tissue from pressure and/or shear.  Wound Description (Comments): dark purple discoloration to shoulder  Present on Admission: No  Dressing Type Foam - Lift dressing to assess site every shift 03/29/24 2100   Severe protein malnutrition/suspicious for dysphagia: Speech was consulted, continue nutritional  supplements. Patient on a pured diet albumin  appears to be improving. Continue Ensure.  DVT prophylaxis: lovenox  Family Communication:none Status is: Inpatient Remains inpatient appropriate because: Awaiting skilled nursing facility placement.    Code Status:     Code Status Orders  (From admission, onward)           Start     Ordered   03/04/24 2149  Do not attempt resuscitation (DNR)- Limited -Do Not Intubate (DNI)  Continuous       Question Answer Comment  If pulseless and not breathing No CPR or chest compressions.   In Pre-Arrest Conditions (Patient Is Breathing and Has A Pulse) Do not intubate. Provide all appropriate non-invasive medical interventions. Avoid ICU transfer unless indicated or required.   Consent: Discussion documented in EHR or advanced directives reviewed      03/04/24 2150           Code Status History     Date Active Date Inactive Code Status Order ID Comments User Context   02/25/2024 1652 02/29/2024 1738 Limited: Do not attempt resuscitation (DNR) -DNR-LIMITED -Do Not Intubate/DNI  [id number]  Annette Killings, NP Inpatient   02/15/2024 0814 02/25/2024 1652 Full Code [id number]  Hoyt Macleod, MD ED   11/14/2023 2326 11/16/2023 2302 Full Code [id number]  Jetty Mort, MD ED   10/07/2021 1136 10/07/2021 1942 Full Code [id number]  Darlis Eisenmenger, MD Inpatient   07/28/2017 1834 08/08/2017 1437 Full Code [id number]  Zelda Hickman, PA-C Inpatient   07/28/2017 1834 07/28/2017 1834 Full Code [id number]  Zelda Hickman, PA-C Inpatient   07/26/2017 0109 07/28/2017 1828 Full Code [id number]  Pati Bonine, MD Inpatient  IV Access:   Peripheral IV   Procedures and diagnostic studies:   No results found.   Medical Consultants:   None.   Subjective:    Earl [name] he was sleeping this morning.  Objective:    Vitals:   03/29/24 1749 03/29/24 1926 03/30/24 0453 03/30/24 0836  BP: 109/72 120/76 122/65 123/71  Pulse: 76 80 85  81  Resp: 18 17 18 17   Temp: 98.1 F (36.7 C) 97.9 F (36.6 C) 97.6 F (36.4 C) 98.7 F (37.1 C)  TempSrc: Oral Oral Oral Oral  SpO2: 100% 100% 100% 99%  Weight:      Height:       SpO2: 99 %   Intake/Output Summary (Last 24 hours) at 03/30/2024 1117 Last data filed at 03/30/2024 0400 Gross per 24 hour  Intake 1300 ml  Output 1000 ml  Net 300 ml   Filed Weights   03/04/24 1656 03/15/24 0500  Weight: 68.5 kg 75.3 kg    Exam: General exam: In no acute distress. Respiratory system: Good air movement and clear to auscultation. Cardiovascular system: S1 & S2 heard, RRR. No JVD. Gastrointestinal system: Abdomen is nondistended, soft and nontender.  Skin: No rashes, lesions or ulcers   Data Reviewed:    Labs: Basic Metabolic Panel: Recent Labs  Lab 03/25/24 0514 03/28/24 1210  NA 137  --   K 3.8  --   CL 104  --   CO2 25  --   GLUCOSE 96  --   BUN 20  --   CREATININE 0.67  --   CALCIUM  7.7*  --   PHOS  --  3.4   GFR Estimated Creatinine Clearance: 75.8 mL/min (by C-G formula based on SCr of 0.67 mg/dL). Liver Function Tests: No results for input(s): "AST", "ALT", "ALKPHOS", "BILITOT", "PROT", "ALBUMIN " in the last 168 hours.  No results for input(s): "LIPASE", "AMYLASE" in the last 168 hours. No results for input(s): "AMMONIA" in the last 168 hours. Coagulation profile No results for input(s): "INR", "PROTIME" in the last 168 hours. COVID-19 Labs  No results for input(s): "DDIMER", "FERRITIN", "LDH", "CRP" in the last 72 hours.  Lab Results  Component Value Date   SARSCOV2NAA POSITIVE (A) 03/04/2024   SARSCOV2NAA POSITIVE (A) 02/14/2024   SARSCOV2NAA NEGATIVE 06/07/2019    CBC: Recent Labs  Lab 03/25/24 0514  WBC 7.8  NEUTROABS 5.0  HGB 9.0*  HCT 27.4*  MCV 87.5  PLT 512*   Cardiac Enzymes: No results for input(s): "CKTOTAL", "CKMB", "CKMBINDEX", "TROPONINI" in the last 168 hours. BNP (last 3 results) No results for input(s): "PROBNP" in  the last 8760 hours. CBG: Recent Labs  Lab 03/29/24 0744 03/29/24 1223 03/29/24 1555 03/29/24 2127 03/30/24 0943  GLUCAP 183* 232* 144* 136* 219*   D-Dimer: No results for input(s): "DDIMER" in the last 72 hours. Hgb A1c: No results for input(s): "HGBA1C" in the last 72 hours. Lipid Profile: No results for input(s): "CHOL", "HDL", "LDLCALC", "TRIG", "CHOLHDL", "LDLDIRECT" in the last 72 hours. Thyroid  function studies: No results for input(s): "TSH", "T4TOTAL", "T3FREE", "THYROIDAB" in the last 72 hours.  Invalid input(s): "FREET3" Anemia work up: No results for input(s): "VITAMINB12", "FOLATE", "FERRITIN", "TIBC", "IRON", "RETICCTPCT" in the last 72 hours. Sepsis Labs: Recent Labs  Lab 03/25/24 0514  WBC 7.8   Microbiology No results found for this or any previous visit (from the past 240 hours).   Medications:    acetaminophen   500 mg Oral TID   amiodarone   200  mg Oral Daily   apixaban   5 mg Oral BID   ascorbic acid   500 mg Oral BID   atorvastatin   40 mg Oral Daily   Chlorhexidine  Gluconate Cloth  6 each Topical Daily   diclofenac  Sodium  4 g Topical QID   feeding supplement  237 mL Oral TID BM   ferrous sulfate   325 mg Oral QHS   furosemide   20 mg Oral Daily   guaiFENesin   600 mg Oral BID   insulin  aspart  0-15 Units Subcutaneous TID WC   insulin  aspart  0-5 Units Subcutaneous QHS   insulin  aspart  4 Units Subcutaneous TID WC   insulin  glargine-yfgn  5 Units Subcutaneous Daily   levothyroxine   50 mcg Oral Q0600   liver oil-zinc  oxide   Topical BID   midodrine   5 mg Oral TID WC   multivitamin with minerals  1 tablet Oral Daily   nutrition supplement (JUVEN)  1 packet Oral BID BM   oxyCODONE   10 mg Oral Q12H   phosphorus  500 mg Oral TID   senna-docusate  1 tablet Oral QHS   sodium chloride  flush  3 mL Intravenous Q12H   Tafamidis   1 capsule Oral Daily   Continuous Infusions:    LOS: 26 days   Macdonald Savoy  [facility]  03/30/2024,  11:17 AM

## 2024-03-30 NOTE — Plan of Care (Signed)
  Problem: Fluid Volume: Goal: Hemodynamic stability will improve Outcome: Progressing   Problem: Clinical Measurements: Goal: Diagnostic test results will improve Outcome: Progressing Goal: Signs and symptoms of infection will decrease Outcome: Progressing   Problem: Respiratory: Goal: Ability to maintain adequate ventilation will improve Outcome: Progressing   Problem: Education: Goal: Knowledge of risk factors and measures for prevention of condition will improve Outcome: Progressing   Problem: Coping: Goal: Psychosocial and spiritual needs will be supported Outcome: Progressing   Problem: Respiratory: Goal: Will maintain a patent airway Outcome: Progressing Goal: Complications related to the disease process, condition or treatment will be avoided or minimized Outcome: Progressing   Problem: Education: Goal: Ability to describe self-care measures that may prevent or decrease complications (Diabetes Survival Skills Education) will improve Outcome: Progressing Goal: Individualized Educational Video(s) Outcome: Progressing   Problem: Coping: Goal: Ability to adjust to condition or change in health will improve Outcome: Progressing   Problem: Fluid Volume: Goal: Ability to maintain a balanced intake and output will improve Outcome: Progressing   Problem: Health Behavior/Discharge Planning: Goal: Ability to identify and utilize available resources and services will improve Outcome: Progressing Goal: Ability to manage health-related needs will improve Outcome: Progressing   Problem: Metabolic: Goal: Ability to maintain appropriate glucose levels will improve Outcome: Progressing   Problem: Nutritional: Goal: Maintenance of adequate nutrition will improve Outcome: Progressing Goal: Progress toward achieving an optimal weight will improve Outcome: Progressing   Problem: Skin Integrity: Goal: Risk for impaired skin integrity will decrease Outcome: Progressing    Problem: Tissue Perfusion: Goal: Adequacy of tissue perfusion will improve Outcome: Progressing   Problem: Education: Goal: Knowledge of General Education information will improve Description: Including pain rating scale, medication(s)/side effects and non-pharmacologic comfort measures Outcome: Progressing   Problem: Health Behavior/Discharge Planning: Goal: Ability to manage health-related needs will improve Outcome: Progressing   Problem: Clinical Measurements: Goal: Ability to maintain clinical measurements within normal limits will improve Outcome: Progressing Goal: Will remain free from infection Outcome: Progressing Goal: Diagnostic test results will improve Outcome: Progressing Goal: Respiratory complications will improve Outcome: Progressing Goal: Cardiovascular complication will be avoided Outcome: Progressing   Problem: Activity: Goal: Risk for activity intolerance will decrease Outcome: Progressing   Problem: Nutrition: Goal: Adequate nutrition will be maintained Outcome: Progressing   Problem: Coping: Goal: Level of anxiety will decrease Outcome: Progressing   Problem: Elimination: Goal: Will not experience complications related to bowel motility Outcome: Progressing Goal: Will not experience complications related to urinary retention Outcome: Progressing   Problem: Pain Managment: Goal: General experience of comfort will improve and/or be controlled Outcome: Progressing   Problem: Safety: Goal: Ability to remain free from injury will improve Outcome: Progressing   Problem: Skin Integrity: Goal: Risk for impaired skin integrity will decrease Outcome: Progressing

## 2024-03-31 DIAGNOSIS — R6520 Severe sepsis without septic shock: Secondary | ICD-10-CM | POA: Diagnosis not present

## 2024-03-31 DIAGNOSIS — A419 Sepsis, unspecified organism: Secondary | ICD-10-CM | POA: Diagnosis not present

## 2024-03-31 LAB — GLUCOSE, CAPILLARY
Glucose-Capillary: 174 mg/dL — ABNORMAL HIGH (ref 70–99)
Glucose-Capillary: 163 mg/dL — ABNORMAL HIGH (ref 70–99)
Glucose-Capillary: 95 mg/dL (ref 70–99)
Glucose-Capillary: 137 mg/dL — ABNORMAL HIGH (ref 70–99)

## 2024-03-31 MED ORDER — POLYETHYLENE GLYCOL 3350 17 G PO PACK
17.0000 g | PACK | Freq: Two times a day (BID) | ORAL | Status: AC
  Administered 2024-03-31 – 2024-04-02 (×3): 17 g via ORAL
  Filled 2024-03-31 (×4): qty 1

## 2024-03-31 NOTE — Progress Notes (Signed)
 Psychosocial Progressive/Outcome: ANOx4, calm and cooperative  Daughter at bedside   Pain/Comfort Progression/Outcome: Pt only complained of pain when turning  Pain only requested the voltaren  for knee and ankle pain   Clinical Progression/Outcome: Adequate fluid and diet intake, daughter helped feed pt Turned and preposition during shift  Voids to existing external cath  Had SMALL blob of a BM Slept in between care Maintained safety

## 2024-03-31 NOTE — Progress Notes (Signed)
 [facility] PROGRESS NOTE    Progress Note  Earl [name]  WUJ:[id number] DOB: 1941/04/23 DOA: 03/04/2024 PCP: [name], MD     Brief Narrative:   Earl [name] is an 83 y.o. 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  Critical care was consulted for persistent hypotension.  Respiratory panel was negative.  General surgery was consulted for sacral wound debridement with bedside excision on 03/05/2024 and 03/07/2024.  He is now hemodynamically stable for discharge.  Assessment/Plan:  Severe sepsis secondary to chronic infected pressure ulcers stage III: With pain and hypotension on admission. CT scan of the abdomen pelvis shows sacral decubitus ulcer extending to the right gluteus muscle with no evidence of osteomyelitis. Seen by PCCM started empirically on antibiotics which she has completed his course on 03/21/2024. General surgery is on board he underwent bedside excisional debridement on 03/05/2024 and 03/07/2024. General surgery recommended no further debridement. ID was consulted to continue complete antibiotic treatment in house. Patient is medically stable to be transferred to skilled nursing facility.  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  See below for further details. I have explained to the daughter that this severe decubitus ulcer will take months to years to heal with good nutrition and good care, due to his comorbidities and nutritional status may be more.  Continue care per wound care  instructions. Currently on no overlay mattress. Turn every 2 hours out of bed to chair, try if possible to set him up in the chair daily. He has been eating about 20 to 50% of his meals. Ensure 3 times daily.  History of paroxysmal atrial fibrillation [facility]) Cardiology is consulted currently on amiodarone  and Eliquis . Patient has had intermittent bouts of A-fib but remains asymptomatic now rate controlled.  History of HFrEF/cardiac amyloid/nonischemic cardiomyopathy: Entresto  and metoprolol  held mission. Cardiology recommended to follow-up with them as an outpatient continue low-dose Lasix . Blood pressure seems to be stable off Entresto  and metoprolol .  Left lower lobe pneumonia: Completed course of antibiotics.  Insulin -dependent diabetes mellitus type 2: With an A1c of 8.4. Continue long-acting insulin  plus sliding scale.  GERD: Stable.  Anemia of chronic disease: Status post 1 unit packed red blood cells last hemoglobin was around 9.  Hypothyroidism: Synthroid .  Constipation: Started on MiraLAX  p.o. twice daily  Stage 4 sacral decubitus ulcer present on admission RN Pressure Injury Documentation: Pressure Injury 02/26/24 Coccyx Mid Unstageable - Full thickness tissue loss in which the base of the injury is covered by slough (yellow, tan, gray, green or brown) and/or eschar (tan, brown or black) in the wound bed. (Active)  02/26/24 0916  Location: Coccyx  Location Orientation: Mid  Staging: Unstageable - Full thickness tissue loss in which the base of the injury is covered by slough (yellow, tan, gray, green or brown) and/or eschar (tan, brown or black) in the wound bed.  Wound Description (Comments):   Present on Admission: No  Dressing Type Foam - Lift dressing to assess site every shift 03/29/24 2100     Pressure Injury 02/26/24 Toe (Comment  which one) Anterior;Right Deep Tissue Pressure Injury - Purple or maroon localized area  of discolored intact skin or  blood-filled blister due to damage of underlying soft tissue from pressure and/or shear. right gre (Active)  02/26/24 1215  Location: Toe (Comment  which one)  Location Orientation: Anterior;Right  Staging: Deep Tissue Pressure Injury - Purple or maroon localized area of discolored intact skin or blood-filled blister due to damage of underlying soft tissue from pressure and/or shear.  Wound Description (Comments): right great toe  Present on Admission:   Dressing Type Foam - Lift dressing to assess site every shift 03/31/24 0552     Pressure Injury 03/06/24 Hip Anterior;Left;Proximal Deep Tissue Pressure Injury - Purple or maroon localized area of discolored intact skin or blood-filled blister due to damage of underlying soft tissue from pressure and/or shear. Fluid filled blister w (Active)  03/06/24 1612  Location: Hip  Location Orientation: Anterior;Left;Proximal  Staging: Deep Tissue Pressure Injury - Purple or maroon localized area of discolored intact skin or blood-filled blister due to damage of underlying soft tissue from pressure and/or shear.  Wound Description (Comments): Fluid filled blister with purple discoloration  Present on Admission: No  Dressing Type Foam - Lift dressing to assess site every shift 03/29/24 2100     Pressure Injury 03/06/24 Shoulder Left Deep Tissue Pressure Injury - Purple or maroon localized area of discolored intact skin or blood-filled blister due to damage of underlying soft tissue from pressure and/or shear. dark purple discoloration to should (Active)  03/06/24 1613  Location: Shoulder  Location Orientation: Left  Staging: Deep Tissue Pressure Injury - Purple or maroon localized area of discolored intact skin or blood-filled blister due to damage of underlying soft tissue from pressure and/or shear.  Wound Description (Comments): dark purple discoloration to shoulder  Present on Admission: No  Dressing Type Non adherent 03/31/24 0552   Severe  protein malnutrition/suspicious for dysphagia: Speech was consulted, continue nutritional supplements. Patient on a pured diet albumin  appears to be improving. Continue Ensure.  DVT prophylaxis: lovenox  Family Communication:none Status is: Inpatient Remains inpatient appropriate because: Awaiting skilled nursing facility placement.    Code Status:     Code Status Orders  (From admission, onward)           Start     Ordered   03/04/24 2149  Do not attempt resuscitation (DNR)- Limited -Do Not Intubate (DNI)  Continuous       Question Answer Comment  If pulseless and not breathing No CPR or chest compressions.   In Pre-Arrest Conditions (Patient Is Breathing and Has A Pulse) Do not intubate. Provide all appropriate non-invasive medical interventions. Avoid ICU transfer unless indicated or required.   Consent: Discussion documented in EHR or advanced directives reviewed      03/04/24 2150           Code Status History     Date Active Date Inactive Code Status Order ID Comments User Context   02/25/2024 1652 02/29/2024 1738 Limited: Do not attempt resuscitation (DNR) -DNR-LIMITED -Do Not Intubate/DNI  [id number]  Annette Killings, NP Inpatient   02/15/2024 0814 02/25/2024 1652 Full Code [id number]  Hoyt Macleod, MD ED   11/14/2023 2326 11/16/2023 2302 Full Code [id number]  Jetty Mort, MD ED   10/07/2021 1136 10/07/2021 1942 Full Code [id number]  Darlis Eisenmenger, MD Inpatient   07/28/2017 1834 08/08/2017 1437 Full Code [id number]  Zelda Hickman, PA-C Inpatient   07/28/2017 1834 07/28/2017 1834 Full Code [id number]  Zelda Hickman, PA-C Inpatient   07/26/2017 0109 07/28/2017 1828 Full Code [id number]  Pati Bonine, MD Inpatient         IV Access:   Peripheral IV   Procedures and diagnostic studies:   No results found.   Medical Consultants:   None.   Subjective:    Travious Wheeless he was sleeping this morning.  Objective:    Vitals:   03/30/24 2007  03/31/24 0039 03/31/24 0356 03/31/24 0740  BP: 126/69 129/75 121/81 117/61  Pulse: 85 84 78 88  Resp: 18 18 19    Temp: 97.7 F (36.5 C) 98.8 F (37.1 C) 98.3 F (36.8 C) 98.1 F (36.7 C)  TempSrc:  Oral Oral   SpO2: 100% 100% 99% 100%  Weight:      Height:       SpO2: 100 %   Intake/Output Summary (Last 24 hours) at 03/31/2024 0945 Last data filed at 03/31/2024 0500 Gross per 24 hour  Intake 1668 ml  Output 2680 ml  Net -1012 ml   Filed Weights   03/04/24 1656 03/15/24 0500  Weight: 68.5 kg 75.3 kg    Exam: General exam: In no acute distress. Respiratory system: Good air movement and clear to auscultation. Cardiovascular system: S1 & S2 heard, RRR. No JVD. Gastrointestinal system: Abdomen is nondistended, soft and nontender.  Skin: No rashes, lesions or ulcers   Data Reviewed:    Labs: Basic Metabolic Panel: Recent Labs  Lab 03/25/24 0514 03/28/24 1210  NA 137  --   K 3.8  --   CL 104  --   CO2 25  --   GLUCOSE 96  --   BUN 20  --   CREATININE 0.67  --   CALCIUM  7.7*  --   PHOS  --  3.4   GFR Estimated Creatinine Clearance: 75.8 mL/min (by C-G formula based on SCr of 0.67 mg/dL). Liver Function Tests: No results for input(s): "AST", "ALT", "ALKPHOS", "BILITOT", "PROT", "ALBUMIN " in the last 168 hours.  No results for input(s): "LIPASE", "AMYLASE" in the last 168 hours. No results for input(s): "AMMONIA" in the last 168 hours. Coagulation profile No results for input(s): "INR", "PROTIME" in the last 168 hours. COVID-19 Labs  No results for input(s): "DDIMER", "FERRITIN", "LDH", "CRP" in the last 72 hours.  Lab Results  Component Value Date   SARSCOV2NAA POSITIVE (A) 03/04/2024   SARSCOV2NAA POSITIVE (A) 02/14/2024   SARSCOV2NAA NEGATIVE 06/07/2019    CBC: Recent Labs  Lab 03/25/24 0514  WBC 7.8  NEUTROABS 5.0  HGB 9.0*  HCT 27.4*  MCV 87.5  PLT 512*   Cardiac Enzymes: No results for input(s): "CKTOTAL", "CKMB", "CKMBINDEX",  "TROPONINI" in the last 168 hours. BNP (last 3 results) No results for input(s): "PROBNP" in the last 8760 hours. CBG: Recent Labs  Lab 03/30/24 0943 03/30/24 1145 03/30/24 1618 03/30/24 2040 03/31/24 0827  GLUCAP 219* 180* 109* 162* 174*   D-Dimer: No results for input(s): "DDIMER" in the last 72 hours. Hgb A1c: No results for input(s): "HGBA1C" in the last 72 hours. Lipid Profile: No results for input(s): "CHOL", "HDL", "LDLCALC", "TRIG", "CHOLHDL", "LDLDIRECT" in the last 72 hours. Thyroid  function studies: No results for input(s): "TSH", "T4TOTAL", "T3FREE", "THYROIDAB" in the last 72 hours.  Invalid input(s): "FREET3" Anemia work up: No results for input(s): "VITAMINB12", "FOLATE", "FERRITIN", "TIBC", "IRON", "RETICCTPCT" in the last 72 hours. Sepsis Labs: Recent Labs  Lab 03/25/24 0514  WBC 7.8   Microbiology No results found for this or any previous visit (from the past 240 hours).   Medications:  acetaminophen   500 mg Oral TID   amiodarone   200 mg Oral Daily   apixaban   5 mg Oral BID   ascorbic acid   500 mg Oral BID   atorvastatin   40 mg Oral Daily   Chlorhexidine  Gluconate Cloth  6 each Topical Daily   diclofenac  Sodium  4 g Topical QID   feeding supplement  237 mL Oral TID BM   ferrous sulfate   325 mg Oral QHS   furosemide   20 mg Oral Daily   guaiFENesin   600 mg Oral BID   insulin  aspart  0-15 Units Subcutaneous TID WC   insulin  aspart  0-5 Units Subcutaneous QHS   insulin  aspart  4 Units Subcutaneous TID WC   insulin  glargine-yfgn  7 Units Subcutaneous Daily   levothyroxine   50 mcg Oral Q0600   liver oil-zinc  oxide   Topical BID   midodrine   5 mg Oral TID WC   multivitamin with minerals  1 tablet Oral Daily   nutrition supplement (JUVEN)  1 packet Oral BID BM   oxyCODONE   10 mg Oral Q12H   phosphorus  500 mg Oral TID   senna-docusate  1 tablet Oral QHS   sodium chloride  flush  3 mL Intravenous Q12H   Tafamidis   1 capsule Oral Daily    Continuous Infusions:    LOS: 27 days   Macdonald Savoy  [facility]  03/31/2024, 9:45 AM

## 2024-04-01 DIAGNOSIS — A419 Sepsis, unspecified organism: Secondary | ICD-10-CM | POA: Diagnosis not present

## 2024-04-01 DIAGNOSIS — R6520 Severe sepsis without septic shock: Secondary | ICD-10-CM | POA: Diagnosis not present

## 2024-04-01 LAB — GLUCOSE, CAPILLARY
Glucose-Capillary: 134 mg/dL — ABNORMAL HIGH (ref 70–99)
Glucose-Capillary: 205 mg/dL — ABNORMAL HIGH (ref 70–99)
Glucose-Capillary: 110 mg/dL — ABNORMAL HIGH (ref 70–99)
Glucose-Capillary: 102 mg/dL — ABNORMAL HIGH (ref 70–99)

## 2024-04-01 MED ORDER — POLYVINYL ALCOHOL 1.4 % OP SOLN
1.0000 [drp] | OPHTHALMIC | Status: DC | PRN
  Administered 2024-04-07 – 2024-07-23 (×14): 1 [drp] via OPHTHALMIC
  Filled 2024-04-01: qty 15

## 2024-04-01 NOTE — TOC Progression Note (Signed)
 Transition of Care [facility]) - Progression Note    Patient Details  Name: Earl [name] MRN: [id number] Date of Birth: Feb 12, 1941  Transition of [facility]) CM/SW Contact  Katrinka Parr, [place] Phone Number: 04/01/2024, 4:20 PM  Clinical Narrative:     CSW contacted Atlee Leach with [facility] and requested that Velia Gess call pt's daughter to discuss daughter's questions. Velia Gess agreed and followed up CSW stating that pt's daughter informed her that daughter would email her questions in PDF format tomorrow late afternoon.    Expected Discharge Plan: Skilled Nursing Facility Barriers to Discharge: Continued Medical Work up  Expected Discharge Plan and Services In-house Referral: Clinical Social Work     Living arrangements for the past 2 months:  (from [facility] short term rehab)                                       Social Determinants of Health (SDOH) Interventions SDOH Screenings   Food Insecurity: No Food Insecurity (03/05/2024)  Housing: Low Risk  (03/05/2024)  Transportation Needs: No Transportation Needs (03/05/2024)  Utilities: Not At Risk (03/05/2024)  Depression (PHQ2-9): Low Risk  (10/03/2023)  Financial Resource Strain: Patient Declined (01/16/2024)  Physical Activity: Unknown (01/16/2024)  Social Connections: Moderately Integrated (03/05/2024)  Stress: Stress Concern Present (01/16/2024)  Tobacco Use: Low Risk  (03/04/2024)    Readmission Risk Interventions    03/19/2024   10:24 AM  Readmission Risk Prevention Plan  Transportation Screening Complete  Medication Review (RN Care Manager) Complete  PCP or Specialist appointment within 3-5 days of discharge Complete  HRI or Home Care Consult Complete  SW Recovery Care/Counseling Consult Complete  Palliative Care Screening Complete  Skilled Nursing Facility Complete

## 2024-04-01 NOTE — Progress Notes (Signed)
 Physical Therapy Treatment Patient Details Name: Earl [name] MRN: [id number] DOB: August 06, 1941 Today's Date: 04/01/2024   History of Present Illness 83 y.o. male admitted from [facility] SNF rehab on 03/04/24 with SOB, sacral pain. Workup for acute metabolic encephalopathy, rhabdomyolysis, sepsis secondary to sacral wound infection, afib with RVR. S/p bedside sacral wound debridement 4/15 and 4/17. Of note, recent admit 3/26-4/08/2024 with fall, AMS, COVID; d/c to SNF rehab. Other PMH includes CVA (2021, residual R-side weakness), DM2, HTN, CHF, MGUS, afib on Eliquis .   PT Comments  Pt slowly progressing with mobility. Pt crying at idea of physical therapy and again when encouraged to attempt sitting EOB, pt fearful of pain and anticipation of pain. Pt requiring maxA for bed mobility, BUE/BLE therex/AROM and repositioning for pressure relief; assist for breakfast set up and pt able to feed self with intermittent assist. Pt remains limited by sacral pain, diffuse generalized weakness, decreased activity tolerance, and impaired balance strategies/postural reactions. Continue to recommend SNF-level therapies to maximize functional mobility and decrease caregiver burden.     If plan is discharge home, recommend the following: Two people to help with walking and/or transfers;Two people to help with bathing/dressing/bathroom;Assistance with feeding;Assist for transportation   Can travel by private vehicle     No  Equipment Recommendations  Hospital bed;Hoyer lift    Recommendations for Other Services       Precautions / Restrictions Precautions Precautions: Fall;Other (comment) Recall of Precautions/Restrictions: Intact Precaution/Restrictions Comments: significant pain from sacral wound Restrictions Weight Bearing Restrictions Per Provider Order: No     Mobility  Bed Mobility Overal bed mobility: Needs Assistance Bed Mobility: Rolling Rolling: Max assist, Used rails          General bed mobility comments: improved initiation reaching towards bedrails to roll R/L, requiring maxA for BLE management and trunk rotation limited by rigid posture; R lateral lean when HOB elevated requiring frequent maxA to reposition trunk to neutral. pt adamantly declines attempts to sit EOB    Transfers                   General transfer comment: pt adamantly declines sitting in recliner, "It will hurt too much" despite encouragement and educ on lift equipment    Ambulation/Gait                   Stairs             Wheelchair Mobility     Tilt Bed    Modified Rankin (Stroke Patients Only)       Balance Overall balance assessment: Needs assistance     Sitting balance - Comments:  (R lateral lean with HOB elevated requiring frequent assist to correct and pillows placed to allow neutral alignment to eat breakfast)                                    Communication Communication Communication: Impaired Factors Affecting Communication: Difficulty expressing self;Reduced clarity of speech (speech becoming more clear as pt becoming more awake/alert with intermittent cues to speak up)  Cognition Arousal: Alert Behavior During Therapy: WFL for tasks assessed/performed, Anxious   PT - Cognitive impairments: Problem solving, Memory, Initiation, Sequencing                       PT - Cognition Comments: pt anxious regarding pain and anticipation of pain, crying when I introduced myself  as physical therapy then later crying when attempting to encourage to pt to sit EOB; requires frequent redirection from pain. following majority of simple commands appropriately, increased time secondary to pain; slowed processing Following commands: Impaired Following commands impaired: Follows one step commands with increased time    Cueing Cueing Techniques: Verbal cues, Tactile cues  Exercises Other Exercises Other Exercises: increased time for  BLE AAROM secondary to poor tolerance with pain and anticipation of pain - knee flex/ext partial range, hip flex/abd/add partial range. BUE AAROM shoulder flex/abd, elbow flex/ext    General Comments General comments (skin integrity, edema, etc.): pt able to feed self breakfast with built up fork (pt has foam pieces for R/L hands), and assist to hold cup with pt managing straw, increased time and effort, assist for set up. maxA for repositioning BUE/BLEs for pressure relief, pt left in partial L sidelying for sacral pressure relief; RN notified of time of turn      Pertinent Vitals/Pain Pain Assessment Pain Assessment: Faces Faces Pain Scale: Hurts even more Pain Location: back, buttocks Pain Descriptors / Indicators: Grimacing, Guarding, Crying Pain Intervention(s): Monitored during session, Limited activity within patient's tolerance, Repositioned, Premedicated before session    Home Living                          Prior Function            PT Goals (current goals can now be found in the care plan section) Progress towards PT goals: Not progressing toward goals - comment (limited by pain and anticipation of pain with sitting activity)    Frequency    Min 1X/week      PT Plan      Co-evaluation              AM-PAC PT "6 Clicks" Mobility   Outcome Measure  Help needed turning from your back to your side while in a flat bed without using bedrails?: Total Help needed moving from lying on your back to sitting on the side of a flat bed without using bedrails?: Total Help needed moving to and from a bed to a chair (including a wheelchair)?: Total Help needed standing up from a chair using your arms (e.g., wheelchair or bedside chair)?: Total Help needed to walk in hospital room?: Total Help needed climbing 3-5 steps with a railing? : Total 6 Click Score: 6    End of Session   Activity Tolerance: Patient limited by pain Patient left: in bed;with call  bell/phone within reach;with bed alarm set Nurse Communication: Mobility status;Need for lift equipment PT Visit Diagnosis: Muscle weakness (generalized) (M62.81);Adult, failure to thrive (R62.7);Difficulty in walking, not elsewhere classified (R26.2);Pain     Time: 0815-0855 PT Time Calculation (min) (ACUTE ONLY): 40 min  Charges:    $Therapeutic Exercise: 8-22 mins $Therapeutic Activity: 23-37 mins PT General Charges $$ ACUTE PT VISIT: 1 Visit                     Blase Bur, PT, DPT Acute Rehabilitation Services  Personal: Secure Chat Rehab Office: [phone]  Albino Hum 04/01/2024, 12:04 PM

## 2024-04-01 NOTE — Progress Notes (Signed)
 [facility] PROGRESS NOTE    Progress Note  Earl [name]  ZOX:[id number] DOB: 02/23/1941 DOA: 03/04/2024 PCP: [name], MD     Brief Narrative:   Earl [name] is an 83 y.o. 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  Critical care was consulted for persistent hypotension.  Respiratory panel was negative.  General surgery was consulted for sacral wound debridement with bedside excision on 03/05/2024 and 03/07/2024.  He is now hemodynamically stable for discharge.  Assessment/Plan:  Severe sepsis secondary to chronic infected pressure ulcers stage III: With pain and hypotension on admission. CT scan of the abdomen pelvis shows sacral decubitus ulcer extending to the right gluteus muscle with no evidence of osteomyelitis. Seen by PCCM started empirically on antibiotics which she has completed his course on 03/21/2024. General surgery is on board he underwent bedside excisional debridement on 03/05/2024 and 03/07/2024. General surgery recommended no further debridement. ID was consulted to continue complete antibiotic treatment in house. Patient is medically stable to be transferred to skilled nursing facility.  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  See below for further details. I have explained to the daughter that this severe decubitus ulcer will take months to years to heal with good nutrition and good care, due to his comorbidities and nutritional status may be more.  Continue care per wound care  instructions. Currently on no overlay mattress. Turn every 2 hours out of bed to chair, try if possible to set him up in the chair daily. He has been eating about 20 to 50% of his meals. Ensure 3 times daily.  History of paroxysmal atrial fibrillation [facility] Cardiology is consulted currently on amiodarone  and Eliquis . Patient has had intermittent bouts of A-fib but remains asymptomatic now rate controlled. Daughter does not want to take off the telemetry.  History of HFrEF/cardiac amyloid/nonischemic cardiomyopathy: Entresto  and metoprolol  held mission. Cardiology recommended to follow-up with them as an outpatient continue low-dose Lasix . Blood pressure seems to be stable off Entresto  and metoprolol .  Left lower lobe pneumonia: Completed course of antibiotics.  Insulin -dependent diabetes mellitus type 2: With an A1c of 8.4. Continue long-acting insulin  plus sliding scale.  GERD: Stable.  Anemia of chronic disease: Status post 1 unit packed red blood cells last hemoglobin was around 9.  Hypothyroidism: Synthroid .  Constipation: Started on MiraLAX  p.o. twice daily  Stage 4 sacral decubitus ulcer present on admission RN Pressure Injury Documentation: Pressure Injury 02/26/24 Coccyx Mid Unstageable - Full thickness tissue loss in which the base of the injury is covered by slough (yellow, tan, gray, green or brown) and/or eschar (tan, brown or black) in the wound bed. (Active)  02/26/24 0916  Location: Coccyx  Location Orientation: Mid  Staging: Unstageable - Full thickness tissue loss in which the base of the injury is covered by slough (yellow, tan, gray, green or brown) and/or eschar (tan, brown or black) in the wound bed.  Wound Description (Comments):   Present on Admission: No  Dressing Type Foam - Lift dressing to assess site every shift 03/29/24 2100     Pressure Injury 02/26/24 Toe (Comment  which one) Anterior;Right Deep  Tissue Pressure Injury - Purple or maroon  localized area of discolored intact skin or blood-filled blister due to damage of underlying soft tissue from pressure and/or shear. right gre (Active)  02/26/24 1215  Location: Toe (Comment  which one)  Location Orientation: Anterior;Right  Staging: Deep Tissue Pressure Injury - Purple or maroon localized area of discolored intact skin or blood-filled blister due to damage of underlying soft tissue from pressure and/or shear.  Wound Description (Comments): right great toe  Present on Admission:   Dressing Type Foam - Lift dressing to assess site every shift 03/31/24 2100     Pressure Injury 03/06/24 Hip Anterior;Left;Proximal Deep Tissue Pressure Injury - Purple or maroon localized area of discolored intact skin or blood-filled blister due to damage of underlying soft tissue from pressure and/or shear. Fluid filled blister w (Active)  03/06/24 1612  Location: Hip  Location Orientation: Anterior;Left;Proximal  Staging: Deep Tissue Pressure Injury - Purple or maroon localized area of discolored intact skin or blood-filled blister due to damage of underlying soft tissue from pressure and/or shear.  Wound Description (Comments): Fluid filled blister with purple discoloration  Present on Admission: No  Dressing Type Foam - Lift dressing to assess site every shift 03/29/24 2100     Pressure Injury 03/06/24 Shoulder Left Deep Tissue Pressure Injury - Purple or maroon localized area of discolored intact skin or blood-filled blister due to damage of underlying soft tissue from pressure and/or shear. dark purple discoloration to should (Active)  03/06/24 1613  Location: Shoulder  Location Orientation: Left  Staging: Deep Tissue Pressure Injury - Purple or maroon localized area of discolored intact skin or blood-filled blister due to damage of underlying soft tissue from pressure and/or shear.  Wound Description (Comments): dark purple discoloration to shoulder  Present on Admission: No  Dressing Type  Foam - Lift dressing to assess site every shift 03/31/24 2100   Severe protein malnutrition/suspicious for dysphagia: Speech was consulted, continue nutritional supplements. Patient on a pured diet albumin  appears to be improving. Continue Ensure.  DVT prophylaxis: lovenox  Family Communication:none Status is: Inpatient Remains inpatient appropriate because: Awaiting skilled nursing facility placement.    Code Status:     Code Status Orders  (From admission, onward)           Start     Ordered   03/04/24 2149  Do not attempt resuscitation (DNR)- Limited -Do Not Intubate (DNI)  Continuous       Question Answer Comment  If pulseless and not breathing No CPR or chest compressions.   In Pre-Arrest Conditions (Patient Is Breathing and Has A Pulse) Do not intubate. Provide all appropriate non-invasive medical interventions. Avoid ICU transfer unless indicated or required.   Consent: Discussion documented in EHR or advanced directives reviewed      03/04/24 2150           Code Status History     Date Active Date Inactive Code Status Order ID Comments User Context   02/25/2024 1652 02/29/2024 1738 Limited: Do not attempt resuscitation (DNR) -DNR-LIMITED -Do Not Intubate/DNI  [id number]  Annette Killings, NP Inpatient   02/15/2024 0814 02/25/2024 1652 Full Code [id number]  Hoyt Macleod, MD ED   11/14/2023 2326 11/16/2023 2302 Full Code [id number]  Jetty Mort, MD ED   10/07/2021 1136 10/07/2021 1942 Full Code [id number]  Darlis Eisenmenger, MD Inpatient   07/28/2017 1834 08/08/2017 1437 Full Code [id number]  Zelda Hickman, PA-C Inpatient   07/28/2017 1834 07/28/2017 1834 Full Code  [id number]  Jairo Mayer Inpatient   07/26/2017 0109 07/28/2017 1828 Full Code [id number]  Pati Bonine, MD Inpatient         IV Access:   Peripheral IV   Procedures and diagnostic studies:   No results found.   Medical Consultants:   None.   Subjective:    Earl [name] he was  sleeping this morning.  Objective:    Vitals:   04/01/24 0008 04/01/24 0514 04/01/24 0545 04/01/24 0750  BP: 111/87 128/73 122/74 125/74  Pulse: 75 (!) 113 (!) 110 90  Resp: 20 18 18 19   Temp: 98.4 F (36.9 C) 98.2 F (36.8 C) 98.7 F (37.1 C) 98.3 F (36.8 C)  TempSrc: Oral  Oral   SpO2: 100% 99% 97% 100%  Weight:      Height:       SpO2: 100 %   Intake/Output Summary (Last 24 hours) at 04/01/2024 0937 Last data filed at 04/01/2024 0513 Gross per 24 hour  Intake 400 ml  Output 1400 ml  Net -1000 ml   Filed Weights   03/04/24 1656 03/15/24 0500  Weight: 68.5 kg 75.3 kg    Exam: General exam: In no acute distress. Respiratory system: Good air movement and clear to auscultation. Cardiovascular system: S1 & S2 heard, RRR. No JVD. Gastrointestinal system: Abdomen is nondistended, soft and nontender.  Skin: No rashes, lesions or ulcers   Data Reviewed:    Labs: Basic Metabolic Panel: Recent Labs  Lab 03/28/24 1210  PHOS 3.4   GFR Estimated Creatinine Clearance: 75.8 mL/min (by C-G formula based on SCr of 0.67 mg/dL). Liver Function Tests: No results for input(s): "AST", "ALT", "ALKPHOS", "BILITOT", "PROT", "ALBUMIN " in the last 168 hours.  No results for input(s): "LIPASE", "AMYLASE" in the last 168 hours. No results for input(s): "AMMONIA" in the last 168 hours. Coagulation profile No results for input(s): "INR", "PROTIME" in the last 168 hours. COVID-19 Labs  No results for input(s): "DDIMER", "FERRITIN", "LDH", "CRP" in the last 72 hours.  Lab Results  Component Value Date   SARSCOV2NAA POSITIVE (A) 03/04/2024   SARSCOV2NAA POSITIVE (A) 02/14/2024   SARSCOV2NAA NEGATIVE 06/07/2019    CBC: No results for input(s): "WBC", "NEUTROABS", "HGB", "HCT", "MCV", "PLT" in the last 168 hours.  Cardiac Enzymes: No results for input(s): "CKTOTAL", "CKMB", "CKMBINDEX", "TROPONINI" in the last 168 hours. BNP (last 3 results) No results for input(s):  "PROBNP" in the last 8760 hours. CBG: Recent Labs  Lab 03/31/24 0827 03/31/24 1200 03/31/24 1708 03/31/24 1936 04/01/24 0748  GLUCAP 174* 163* 95 137* 134*   D-Dimer: No results for input(s): "DDIMER" in the last 72 hours. Hgb A1c: No results for input(s): "HGBA1C" in the last 72 hours. Lipid Profile: No results for input(s): "CHOL", "HDL", "LDLCALC", "TRIG", "CHOLHDL", "LDLDIRECT" in the last 72 hours. Thyroid  function studies: No results for input(s): "TSH", "T4TOTAL", "T3FREE", "THYROIDAB" in the last 72 hours.  Invalid input(s): "FREET3" Anemia work up: No results for input(s): "VITAMINB12", "FOLATE", "FERRITIN", "TIBC", "IRON", "RETICCTPCT" in the last 72 hours. Sepsis Labs: No results for input(s): "PROCALCITON", "WBC", "LATICACIDVEN" in the last 168 hours.  Microbiology No results found for this or any previous visit (from the past 240 hours).   Medications:    acetaminophen   500 mg Oral TID   amiodarone   200 mg Oral Daily   apixaban   5 mg Oral BID   ascorbic acid   500 mg Oral BID   atorvastatin   40 mg Oral Daily  Chlorhexidine  Gluconate Cloth  6 each Topical Daily   diclofenac  Sodium  4 g Topical QID   feeding supplement  237 mL Oral TID BM   ferrous sulfate   325 mg Oral QHS   furosemide   20 mg Oral Daily   guaiFENesin   600 mg Oral BID   insulin  aspart  0-15 Units Subcutaneous TID WC   insulin  aspart  0-5 Units Subcutaneous QHS   insulin  aspart  4 Units Subcutaneous TID WC   insulin  glargine-yfgn  7 Units Subcutaneous Daily   levothyroxine   50 mcg Oral Q0600   liver oil-zinc  oxide   Topical BID   midodrine   5 mg Oral TID WC   multivitamin with minerals  1 tablet Oral Daily   nutrition supplement (JUVEN)  1 packet Oral BID BM   oxyCODONE   10 mg Oral Q12H   phosphorus  500 mg Oral TID   polyethylene glycol  17 g Oral BID   senna-docusate  1 tablet Oral QHS   sodium chloride  flush  3 mL Intravenous Q12H   Tafamidis   1 capsule Oral Daily   Continuous  Infusions:    LOS: 28 days   Earl [name]  [facility]  04/01/2024, 9:37 AM

## 2024-04-01 NOTE — Plan of Care (Signed)
 Problem: Fluid Volume: Goal: Hemodynamic stability will improve 04/01/2024 0014 by Ayesha Bold, RN Outcome: Progressing 04/01/2024 0012 by Ayesha Bold, RN Outcome: Progressing   Problem: Clinical Measurements: Goal: Diagnostic test results will improve 04/01/2024 0014 by Ayesha Bold, RN Outcome: Progressing 04/01/2024 0012 by Ayesha Bold, RN Outcome: Progressing Goal: Signs and symptoms of infection will decrease 04/01/2024 0014 by Ayesha Bold, RN Outcome: Progressing 04/01/2024 0012 by Ayesha Bold, RN Outcome: Progressing   Problem: Respiratory: Goal: Ability to maintain adequate ventilation will improve 04/01/2024 0014 by Ayesha Bold, RN Outcome: Progressing 04/01/2024 0012 by Ayesha Bold, RN Outcome: Progressing   Problem: Education: Goal: Knowledge of risk factors and measures for prevention of condition will improve 04/01/2024 0014 by Ayesha Bold, RN Outcome: Progressing 04/01/2024 0012 by Ayesha Bold, RN Outcome: Progressing   Problem: Coping: Goal: Psychosocial and spiritual needs will be supported 04/01/2024 0014 by Ayesha Bold, RN Outcome: Progressing 04/01/2024 0012 by Ayesha Bold, RN Outcome: Progressing   Problem: Respiratory: Goal: Will maintain a patent airway 04/01/2024 0014 by Ayesha Bold, RN Outcome: Progressing 04/01/2024 0012 by Ayesha Bold, RN Outcome: Progressing Goal: Complications related to the disease process, condition or treatment will be avoided or minimized 04/01/2024 0014 by Ayesha Bold, RN Outcome: Progressing 04/01/2024 0012 by Ayesha Bold, RN Outcome: Progressing   Problem: Education: Goal: Ability to describe self-care measures that may prevent or decrease complications (Diabetes Survival Skills Education) will improve 04/01/2024 0014 by Ayesha Bold, RN Outcome: Progressing 04/01/2024 0012 by Ayesha Bold, RN Outcome: Progressing Goal: Individualized Educational Video(s) 04/01/2024 0014 by  Ayesha Bold, RN Outcome: Progressing 04/01/2024 0012 by Ayesha Bold, RN Outcome: Progressing   Problem: Coping: Goal: Ability to adjust to condition or change in health will improve 04/01/2024 0014 by Ayesha Bold, RN Outcome: Progressing 04/01/2024 0012 by Ayesha Bold, RN Outcome: Progressing   Problem: Fluid Volume: Goal: Ability to maintain a balanced intake and output will improve 04/01/2024 0014 by Ayesha Bold, RN Outcome: Progressing 04/01/2024 0012 by Ayesha Bold, RN Outcome: Progressing   Problem: Health Behavior/Discharge Planning: Goal: Ability to identify and utilize available resources and services will improve 04/01/2024 0014 by Ayesha Bold, RN Outcome: Progressing 04/01/2024 0012 by Ayesha Bold, RN Outcome: Progressing Goal: Ability to manage health-related needs will improve 04/01/2024 0014 by Ayesha Bold, RN Outcome: Progressing 04/01/2024 0012 by Ayesha Bold, RN Outcome: Progressing   Problem: Metabolic: Goal: Ability to maintain appropriate glucose levels will improve 04/01/2024 0014 by Ayesha Bold, RN Outcome: Progressing 04/01/2024 0012 by Ayesha Bold, RN Outcome: Progressing   Problem: Nutritional: Goal: Maintenance of adequate nutrition will improve 04/01/2024 0014 by Ayesha Bold, RN Outcome: Progressing 04/01/2024 0012 by Ayesha Bold, RN Outcome: Progressing Goal: Progress toward achieving an optimal weight will improve 04/01/2024 0014 by Ayesha Bold, RN Outcome: Progressing 04/01/2024 0012 by Ayesha Bold, RN Outcome: Progressing   Problem: Skin Integrity: Goal: Risk for impaired skin integrity will decrease 04/01/2024 0014 by Ayesha Bold, RN Outcome: Progressing 04/01/2024 0012 by Ayesha Bold, RN Outcome: Progressing   Problem: Tissue Perfusion: Goal: Adequacy of tissue perfusion will improve 04/01/2024 0014 by Ayesha Bold, RN Outcome: Progressing 04/01/2024 0012 by Ayesha Bold, RN Outcome:  Progressing   Problem: Education: Goal: Knowledge of General Education information will improve Description: Including pain rating scale, medication(s)/side effects and non-pharmacologic  comfort measures 04/01/2024 0014 by Ayesha Bold, RN Outcome: Progressing 04/01/2024 0012 by Ayesha Bold, RN Outcome: Progressing   Problem: Health Behavior/Discharge Planning: Goal: Ability to manage health-related needs will improve 04/01/2024 0014 by Ayesha Bold, RN Outcome: Progressing 04/01/2024 0012 by Ayesha Bold, RN Outcome: Progressing   Problem: Clinical Measurements: Goal: Ability to maintain clinical measurements within normal limits will improve 04/01/2024 0014 by Ayesha Bold, RN Outcome: Progressing 04/01/2024 0012 by Ayesha Bold, RN Outcome: Progressing Goal: Will remain free from infection 04/01/2024 0014 by Ayesha Bold, RN Outcome: Progressing 04/01/2024 0012 by Ayesha Bold, RN Outcome: Progressing Goal: Diagnostic test results will improve 04/01/2024 0014 by Ayesha Bold, RN Outcome: Progressing 04/01/2024 0012 by Ayesha Bold, RN Outcome: Progressing Goal: Respiratory complications will improve 04/01/2024 0014 by Ayesha Bold, RN Outcome: Progressing 04/01/2024 0012 by Ayesha Bold, RN Outcome: Progressing Goal: Cardiovascular complication will be avoided 04/01/2024 0014 by Ayesha Bold, RN Outcome: Progressing 04/01/2024 0012 by Ayesha Bold, RN Outcome: Progressing   Problem: Activity: Goal: Risk for activity intolerance will decrease 04/01/2024 0014 by Ayesha Bold, RN Outcome: Progressing 04/01/2024 0012 by Ayesha Bold, RN Outcome: Progressing   Problem: Nutrition: Goal: Adequate nutrition will be maintained 04/01/2024 0014 by Ayesha Bold, RN Outcome: Progressing 04/01/2024 0012 by Ayesha Bold, RN Outcome: Progressing   Problem: Coping: Goal: Level of anxiety will decrease 04/01/2024 0014 by Ayesha Bold, RN Outcome:  Progressing 04/01/2024 0012 by Ayesha Bold, RN Outcome: Progressing   Problem: Elimination: Goal: Will not experience complications related to bowel motility 04/01/2024 0014 by Ayesha Bold, RN Outcome: Progressing 04/01/2024 0012 by Ayesha Bold, RN Outcome: Progressing Goal: Will not experience complications related to urinary retention 04/01/2024 0014 by Ayesha Bold, RN Outcome: Progressing 04/01/2024 0012 by Ayesha Bold, RN Outcome: Progressing   Problem: Pain Managment: Goal: General experience of comfort will improve and/or be controlled 04/01/2024 0014 by Ayesha Bold, RN Outcome: Progressing 04/01/2024 0012 by Ayesha Bold, RN Outcome: Progressing   Problem: Safety: Goal: Ability to remain free from injury will improve 04/01/2024 0014 by Ayesha Bold, RN Outcome: Progressing 04/01/2024 0012 by Ayesha Bold, RN Outcome: Progressing   Problem: Skin Integrity: Goal: Risk for impaired skin integrity will decrease 04/01/2024 0014 by Ayesha Bold, RN Outcome: Progressing 04/01/2024 0012 by Ayesha Bold, RN Outcome: Progressing

## 2024-04-01 NOTE — Plan of Care (Signed)
  Problem: Fluid Volume: Goal: Hemodynamic stability will improve Outcome: Progressing   Problem: Clinical Measurements: Goal: Diagnostic test results will improve Outcome: Progressing Goal: Signs and symptoms of infection will decrease Outcome: Progressing   Problem: Respiratory: Goal: Ability to maintain adequate ventilation will improve Outcome: Progressing   Problem: Education: Goal: Knowledge of risk factors and measures for prevention of condition will improve Outcome: Progressing   Problem: Coping: Goal: Psychosocial and spiritual needs will be supported Outcome: Progressing   Problem: Respiratory: Goal: Will maintain a patent airway Outcome: Progressing Goal: Complications related to the disease process, condition or treatment will be avoided or minimized Outcome: Progressing   Problem: Education: Goal: Ability to describe self-care measures that may prevent or decrease complications (Diabetes Survival Skills Education) will improve Outcome: Progressing Goal: Individualized Educational Video(s) Outcome: Progressing   Problem: Coping: Goal: Ability to adjust to condition or change in health will improve Outcome: Progressing   Problem: Fluid Volume: Goal: Ability to maintain a balanced intake and output will improve Outcome: Progressing   Problem: Health Behavior/Discharge Planning: Goal: Ability to identify and utilize available resources and services will improve Outcome: Progressing Goal: Ability to manage health-related needs will improve Outcome: Progressing   Problem: Metabolic: Goal: Ability to maintain appropriate glucose levels will improve Outcome: Progressing   Problem: Nutritional: Goal: Maintenance of adequate nutrition will improve Outcome: Progressing Goal: Progress toward achieving an optimal weight will improve Outcome: Progressing   Problem: Skin Integrity: Goal: Risk for impaired skin integrity will decrease Outcome: Progressing    Problem: Tissue Perfusion: Goal: Adequacy of tissue perfusion will improve Outcome: Progressing   Problem: Education: Goal: Knowledge of General Education information will improve Description: Including pain rating scale, medication(s)/side effects and non-pharmacologic comfort measures Outcome: Progressing   Problem: Health Behavior/Discharge Planning: Goal: Ability to manage health-related needs will improve Outcome: Progressing   Problem: Clinical Measurements: Goal: Ability to maintain clinical measurements within normal limits will improve Outcome: Progressing Goal: Will remain free from infection Outcome: Progressing Goal: Diagnostic test results will improve Outcome: Progressing Goal: Respiratory complications will improve Outcome: Progressing Goal: Cardiovascular complication will be avoided Outcome: Progressing   Problem: Activity: Goal: Risk for activity intolerance will decrease Outcome: Progressing   Problem: Nutrition: Goal: Adequate nutrition will be maintained Outcome: Progressing   Problem: Coping: Goal: Level of anxiety will decrease Outcome: Progressing   Problem: Elimination: Goal: Will not experience complications related to bowel motility Outcome: Progressing Goal: Will not experience complications related to urinary retention Outcome: Progressing   Problem: Pain Managment: Goal: General experience of comfort will improve and/or be controlled Outcome: Progressing   Problem: Safety: Goal: Ability to remain free from injury will improve Outcome: Progressing   Problem: Skin Integrity: Goal: Risk for impaired skin integrity will decrease Outcome: Progressing

## 2024-04-02 ENCOUNTER — Ambulatory Visit: Admitting: Neurology

## 2024-04-02 DIAGNOSIS — A419 Sepsis, unspecified organism: Secondary | ICD-10-CM | POA: Diagnosis not present

## 2024-04-02 DIAGNOSIS — R6520 Severe sepsis without septic shock: Secondary | ICD-10-CM | POA: Diagnosis not present

## 2024-04-02 LAB — GLUCOSE, CAPILLARY
Glucose-Capillary: 96 mg/dL (ref 70–99)
Glucose-Capillary: 242 mg/dL — ABNORMAL HIGH (ref 70–99)
Glucose-Capillary: 187 mg/dL — ABNORMAL HIGH (ref 70–99)
Glucose-Capillary: 130 mg/dL — ABNORMAL HIGH (ref 70–99)

## 2024-04-02 NOTE — Plan of Care (Signed)
 Assumed care at 1900. Pt has been resting comfortably in bed overnight. Pt has been repositioned Q2 hours. Pt tolerated all medications overnight. See MAR. No significant events overnight.    Problem: Fluid Volume: Goal: Hemodynamic stability will improve Outcome: Progressing   Problem: Clinical Measurements: Goal: Diagnostic test results will improve Outcome: Progressing Goal: Signs and symptoms of infection will decrease Outcome: Progressing   Problem: Respiratory: Goal: Ability to maintain adequate ventilation will improve Outcome: Progressing   Problem: Education: Goal: Knowledge of risk factors and measures for prevention of condition will improve Outcome: Progressing   Problem: Coping: Goal: Psychosocial and spiritual needs will be supported Outcome: Progressing   Problem: Respiratory: Goal: Will maintain a patent airway Outcome: Progressing Goal: Complications related to the disease process, condition or treatment will be avoided or minimized Outcome: Progressing   Problem: Education: Goal: Ability to describe self-care measures that may prevent or decrease complications (Diabetes Survival Skills Education) will improve Outcome: Progressing Goal: Individualized Educational Video(s) Outcome: Progressing   Problem: Coping: Goal: Ability to adjust to condition or change in health will improve Outcome: Progressing   Problem: Fluid Volume: Goal: Ability to maintain a balanced intake and output will improve Outcome: Progressing   Problem: Health Behavior/Discharge Planning: Goal: Ability to identify and utilize available resources and services will improve Outcome: Progressing Goal: Ability to manage health-related needs will improve Outcome: Progressing   Problem: Metabolic: Goal: Ability to maintain appropriate glucose levels will improve Outcome: Progressing   Problem: Nutritional: Goal: Maintenance of adequate nutrition will improve Outcome:  Progressing Goal: Progress toward achieving an optimal weight will improve Outcome: Progressing   Problem: Skin Integrity: Goal: Risk for impaired skin integrity will decrease Outcome: Progressing   Problem: Tissue Perfusion: Goal: Adequacy of tissue perfusion will improve Outcome: Progressing   Problem: Education: Goal: Knowledge of General Education information will improve Description: Including pain rating scale, medication(s)/side effects and non-pharmacologic comfort measures Outcome: Progressing   Problem: Health Behavior/Discharge Planning: Goal: Ability to manage health-related needs will improve Outcome: Progressing   Problem: Clinical Measurements: Goal: Ability to maintain clinical measurements within normal limits will improve Outcome: Progressing Goal: Will remain free from infection Outcome: Progressing Goal: Diagnostic test results will improve Outcome: Progressing Goal: Respiratory complications will improve Outcome: Progressing Goal: Cardiovascular complication will be avoided Outcome: Progressing   Problem: Activity: Goal: Risk for activity intolerance will decrease Outcome: Progressing   Problem: Nutrition: Goal: Adequate nutrition will be maintained Outcome: Progressing   Problem: Coping: Goal: Level of anxiety will decrease Outcome: Progressing   Problem: Elimination: Goal: Will not experience complications related to bowel motility Outcome: Progressing Goal: Will not experience complications related to urinary retention Outcome: Progressing   Problem: Pain Managment: Goal: General experience of comfort will improve and/or be controlled Outcome: Progressing   Problem: Safety: Goal: Ability to remain free from injury will improve Outcome: Progressing   Problem: Skin Integrity: Goal: Risk for impaired skin integrity will decrease Outcome: Progressing

## 2024-04-02 NOTE — Progress Notes (Signed)
 Speech Language Pathology Treatment: Dysphagia  Patient Details Name: Earl [name] MRN: [id number] DOB: Dec 11, 1940 Today's Date: 04/02/2024 Time: 4098-1191 SLP Time Calculation (min) (ACUTE ONLY): 15 min  Assessment / Plan / Recommendation Clinical Impression  Patient seen by SLP for skilled treatment session focused on dysphagia goals. His daughter [name] was in the room as well. Patient was awake, alert and in good spirits. Para told SLP that she was planning to see how patient did feeding himself today using adaptations that OT had recommended for utensils. In terms of liquids, Para is hoping to give patient more access to fluids for when she isn't available. SLP retrieved and demonstrated use of 10 cc flow rate controlled Provale cup. Patient was able to hold using double handles and give self sips. He was pleased with the cup and was able to set down and pick up from tray tablet at bedside. He did exhibit one instance of brief cough but overall appears to be tolerating thin liquids. SLP will continue to follow.   HPI HPI: Patient is an 83 y.o. 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 He was discharged from the hospital (admitted for acute metabolic encephalopathy, hypotensive, hypoglycemic) to [facility] for rehab. He presented back to the hospital four days later on 03/04/24 with SOB and pain in sacral area. 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 SLP ordered due to suspicious for dysphagia.      SLP Plan  Continue with current plan of care      Recommendations for follow up therapy are one component of a multi-disciplinary discharge planning process, led by the attending physician.  Recommendations may be updated based on patient status,  additional functional criteria and insurance authorization.    Recommendations  Diet recommendations: Dysphagia 1 (puree);Thin liquid Liquids provided via: Cup;Straw Medication Administration: Crushed with puree Supervision: Trained caregiver to feed patient Compensations: Slow rate;Small sips/bites;Use straw to facilitate chin tuck Postural Changes and/or Swallow Maneuvers: Seated upright 90 degrees;Upright 30-60 min after meal                  Oral care BID   Frequent or constant Supervision/Assistance Dysphagia, unspecified (R13.10)     Continue with current plan of care    Jacqualine Mater, MA, CCC-SLP Speech Therapy

## 2024-04-02 NOTE — Progress Notes (Signed)
 [facility] PROGRESS NOTE    Progress Note  Earl [name]  GNF:[id number] DOB: 08/19/1941 DOA: 03/04/2024 PCP: [name], MD     Brief Narrative:   Earl [name] is an 83 y.o. 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  Critical care was consulted for persistent hypotension.  Respiratory panel was negative.  General surgery was consulted for sacral wound debridement with bedside excision on 03/05/2024 and 03/07/2024.  He is now hemodynamically stable for discharge.  Assessment/Plan:  Severe sepsis secondary to chronic infected pressure ulcers stage III: With pain and hypotension on admission. CT scan of the abdomen pelvis shows sacral decubitus ulcer extending to the right gluteus muscle with no evidence of osteomyelitis. Seen by PCCM started empirically on antibiotics which she has completed his course on 03/21/2024. General surgery is on board he underwent bedside excisional debridement on 03/05/2024 and 03/07/2024. General surgery recommended no further debridement. ID was consulted to continue complete antibiotic treatment in house. Patient is medically stable to be transferred to skilled nursing facility.  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  See below for further details. I have explained to the daughter that this severe decubitus ulcer will take months to years to heal with good nutrition and good care, due to his comorbidities and nutritional status may be more.  Continue care per wound care  instructions. Currently on no overlay mattress. Turn every 2 hours out of bed to chair, try if possible to set him up in the chair daily. He has been eating about 20 to 50% of his meals. Ensure 3 times daily.  History of paroxysmal atrial fibrillation [facility]) Cardiology is consulted currently on amiodarone  and Eliquis . Patient has had intermittent bouts of A-fib but remains asymptomatic now rate controlled. Daughter does not want to take off the telemetry.  History of HFrEF/cardiac amyloid/nonischemic cardiomyopathy: Entresto  and metoprolol  held mission. Cardiology recommended to follow-up with them as an outpatient continue low-dose Lasix . Blood pressure seems to be stable off Entresto  and metoprolol .  Left lower lobe pneumonia: Completed course of antibiotics.  Insulin -dependent diabetes mellitus type 2: With an A1c of 8.4. Continue long-acting insulin  plus sliding scale.  GERD: Stable.  Anemia of chronic disease: Status post 1 unit packed red blood cells last hemoglobin was around 9.  Hypothyroidism: Synthroid .  Constipation: Started on MiraLAX  p.o. twice daily  Stage 4 sacral decubitus ulcer present on admission RN Pressure Injury Documentation: Pressure Injury 02/26/24 Coccyx Mid Unstageable - Full thickness tissue loss in which the base of the injury is covered by slough (yellow, tan, gray, green or brown) and/or eschar (tan, brown or black) in the wound bed. (Active)  02/26/24 0916  Location: Coccyx  Location Orientation: Mid  Staging: Unstageable - Full thickness tissue loss in which the base of the injury is covered by slough (yellow, tan, gray, green or brown) and/or eschar (tan, brown or black) in the wound bed.  Wound Description (Comments):   Present on Admission: No  Dressing Type Foam - Lift dressing to assess site every shift 04/02/24 0500     Pressure Injury 02/26/24 Toe (Comment  which one) Anterior;Right Deep  Tissue Pressure Injury - Purple or maroon  localized area of discolored intact skin or blood-filled blister due to damage of underlying soft tissue from pressure and/or shear. right gre (Active)  02/26/24 1215  Location: Toe (Comment  which one)  Location Orientation: Anterior;Right  Staging: Deep Tissue Pressure Injury - Purple or maroon localized area of discolored intact skin or blood-filled blister due to damage of underlying soft tissue from pressure and/or shear.  Wound Description (Comments): right great toe  Present on Admission:   Dressing Type None 04/02/24 0500     Pressure Injury 03/06/24 Hip Anterior;Left;Proximal Deep Tissue Pressure Injury - Purple or maroon localized area of discolored intact skin or blood-filled blister due to damage of underlying soft tissue from pressure and/or shear. Fluid filled blister w (Active)  03/06/24 1612  Location: Hip  Location Orientation: Anterior;Left;Proximal  Staging: Deep Tissue Pressure Injury - Purple or maroon localized area of discolored intact skin or blood-filled blister due to damage of underlying soft tissue from pressure and/or shear.  Wound Description (Comments): Fluid filled blister with purple discoloration  Present on Admission: No  Dressing Type Foam - Lift dressing to assess site every shift 04/02/24 0500     Pressure Injury 03/06/24 Shoulder Left Deep Tissue Pressure Injury - Purple or maroon localized area of discolored intact skin or blood-filled blister due to damage of underlying soft tissue from pressure and/or shear. dark purple discoloration to should (Active)  03/06/24 1613  Location: Shoulder  Location Orientation: Left  Staging: Deep Tissue Pressure Injury - Purple or maroon localized area of discolored intact skin or blood-filled blister due to damage of underlying soft tissue from pressure and/or shear.  Wound Description (Comments): dark purple discoloration to shoulder  Present on Admission: No  Dressing Type Foam - Lift dressing to assess site every  shift 04/02/24 0500   Severe protein malnutrition/suspicious for dysphagia: Speech was consulted, continue nutritional supplements. Patient on a pured diet albumin  appears to be improving. Continue Ensure.  DVT prophylaxis: lovenox  Family Communication:none Status is: Inpatient Remains inpatient appropriate because: Awaiting skilled nursing facility placement.    Code Status:     Code Status Orders  (From admission, onward)           Start     Ordered   03/04/24 2149  Do not attempt resuscitation (DNR)- Limited -Do Not Intubate (DNI)  Continuous       Question Answer Comment  If pulseless and not breathing No CPR or chest compressions.   In Pre-Arrest Conditions (Patient Is Breathing and Has A Pulse) Do not intubate. Provide all appropriate non-invasive medical interventions. Avoid ICU transfer unless indicated or required.   Consent: Discussion documented in EHR or advanced directives reviewed      03/04/24 2150           Code Status History     Date Active Date Inactive Code Status Order ID Comments User Context   02/25/2024 1652 02/29/2024 1738 Limited: Do not attempt resuscitation (DNR) -DNR-LIMITED -Do Not Intubate/DNI  [id number]  Annette Killings, NP Inpatient   02/15/2024 0814 02/25/2024 1652 Full Code [id number]  Hoyt Macleod, MD ED   11/14/2023 2326 11/16/2023 2302 Full Code [id number]  Jetty Mort, MD ED   10/07/2021 1136 10/07/2021 1942 Full Code [id number]  Darlis Eisenmenger, MD Inpatient   07/28/2017 1834 08/08/2017 1437 Full Code [id number]  Zelda Hickman, PA-C Inpatient   07/28/2017 1834 07/28/2017 1834 Full Code [id number]  Love, Renay Carota, PA-C Inpatient  07/26/2017 0109 07/28/2017 1828 Full Code [id number]  Pati Bonine, MD Inpatient         IV Access:   Peripheral IV   Procedures and diagnostic studies:   No results found.   Medical Consultants:   None.   Subjective:    Earl [name] he was sleeping this morning.  Objective:     Vitals:   04/01/24 2019 04/02/24 0024 04/02/24 0500 04/02/24 0824  BP: 122/72 124/75 115/61 125/80  Pulse: 84 86 81 84  Resp: 18 18 18 17   Temp: 98.1 F (36.7 C) 98.3 F (36.8 C) 97.9 F (36.6 C) 98.3 F (36.8 C)  TempSrc: Oral Oral Oral   SpO2: 99% 99% 100% 100%  Weight:      Height:       SpO2: 100 %   Intake/Output Summary (Last 24 hours) at 04/02/2024 0923 Last data filed at 04/02/2024 0534 Gross per 24 hour  Intake --  Output 2200 ml  Net -2200 ml   Filed Weights   03/04/24 1656 03/15/24 0500  Weight: 68.5 kg 75.3 kg    Exam: General exam: In no acute distress. Respiratory system: Good air movement and clear to auscultation. Cardiovascular system: S1 & S2 heard, RRR. No JVD. Gastrointestinal system: Abdomen is nondistended, soft and nontender.  Skin: No rashes, lesions or ulcers   Data Reviewed:    Labs: Basic Metabolic Panel: Recent Labs  Lab 03/28/24 1210  PHOS 3.4   GFR Estimated Creatinine Clearance: 75.8 mL/min (by C-G formula based on SCr of 0.67 mg/dL). Liver Function Tests: No results for input(s): "AST", "ALT", "ALKPHOS", "BILITOT", "PROT", "ALBUMIN " in the last 168 hours.  No results for input(s): "LIPASE", "AMYLASE" in the last 168 hours. No results for input(s): "AMMONIA" in the last 168 hours. Coagulation profile No results for input(s): "INR", "PROTIME" in the last 168 hours. COVID-19 Labs  No results for input(s): "DDIMER", "FERRITIN", "LDH", "CRP" in the last 72 hours.  Lab Results  Component Value Date   SARSCOV2NAA POSITIVE (A) 03/04/2024   SARSCOV2NAA POSITIVE (A) 02/14/2024   SARSCOV2NAA NEGATIVE 06/07/2019    CBC: No results for input(s): "WBC", "NEUTROABS", "HGB", "HCT", "MCV", "PLT" in the last 168 hours.  Cardiac Enzymes: No results for input(s): "CKTOTAL", "CKMB", "CKMBINDEX", "TROPONINI" in the last 168 hours. BNP (last 3 results) No results for input(s): "PROBNP" in the last 8760 hours. CBG: Recent Labs   Lab 04/01/24 0748 04/01/24 1208 04/01/24 1607 04/01/24 2016 04/02/24 0824  GLUCAP 134* 205* 110* 102* 96   D-Dimer: No results for input(s): "DDIMER" in the last 72 hours. Hgb A1c: No results for input(s): "HGBA1C" in the last 72 hours. Lipid Profile: No results for input(s): "CHOL", "HDL", "LDLCALC", "TRIG", "CHOLHDL", "LDLDIRECT" in the last 72 hours. Thyroid  function studies: No results for input(s): "TSH", "T4TOTAL", "T3FREE", "THYROIDAB" in the last 72 hours.  Invalid input(s): "FREET3" Anemia work up: No results for input(s): "VITAMINB12", "FOLATE", "FERRITIN", "TIBC", "IRON", "RETICCTPCT" in the last 72 hours. Sepsis Labs: No results for input(s): "PROCALCITON", "WBC", "LATICACIDVEN" in the last 168 hours.  Microbiology No results found for this or any previous visit (from the past 240 hours).   Medications:    acetaminophen   500 mg Oral TID   amiodarone   200 mg Oral Daily   apixaban   5 mg Oral BID   ascorbic acid   500 mg Oral BID   atorvastatin   40 mg Oral Daily   Chlorhexidine  Gluconate Cloth  6 each Topical Daily   diclofenac   Sodium  4 g Topical QID   feeding supplement  237 mL Oral TID BM   ferrous sulfate   325 mg Oral QHS   furosemide   20 mg Oral Daily   guaiFENesin   600 mg Oral BID   insulin  aspart  0-15 Units Subcutaneous TID WC   insulin  aspart  0-5 Units Subcutaneous QHS   insulin  aspart  4 Units Subcutaneous TID WC   insulin  glargine-yfgn  7 Units Subcutaneous Daily   levothyroxine   50 mcg Oral Q0600   liver oil-zinc  oxide   Topical BID   midodrine   5 mg Oral TID WC   multivitamin with minerals  1 tablet Oral Daily   nutrition supplement (JUVEN)  1 packet Oral BID BM   oxyCODONE   10 mg Oral Q12H   phosphorus  500 mg Oral TID   polyethylene glycol  17 g Oral BID   senna-docusate  1 tablet Oral QHS   sodium chloride  flush  3 mL Intravenous Q12H   Tafamidis   1 capsule Oral Daily   Continuous Infusions:    LOS: 29 days   Macdonald Savoy  [facility]  04/02/2024, 9:23 AM

## 2024-04-02 NOTE — Progress Notes (Signed)
 Occupational Therapy Treatment Patient Details Name: Earl [name] MRN: [id number] DOB: Jul 23, 1941 Today's Date: 04/02/2024   History of present illness 83 y.o. male admitted from [facility] SNF rehab on 03/04/24 with SOB, sacral pain. Workup for acute metabolic encephalopathy, rhabdomyolysis, sepsis secondary to sacral wound infection, afib with RVR. S/p bedside sacral wound debridement 4/15 and 4/17. Of note, recent admit 3/26-4/08/2024 with fall, AMS, COVID; d/c to SNF rehab. Other PMH includes CVA (2021, residual R-side weakness), DM2, HTN, CHF, MGUS, afib on Eliquis .   OT comments  Pt progressing toward goals this session, incr strength/ROM noted in bil hands, pt able to hold and drink from cup without assist. Pt needing set up A for bed level grooming task and up to mod A for UB bathing at bed level. Encouraged continued use of foam cubes for strengthening and pt reports he is still using built up foam cube on spoon for self-feeding. Pt participating in BUE therex at bed level. Pt presenting with impairments listed below, will follow acutely. Patient will benefit from continued inpatient follow up therapy, <3 hours/day to maximize safety/ind with ADL/functional mobility.       If plan is discharge home, recommend the following:  A lot of help with walking and/or transfers;A lot of help with bathing/dressing/bathroom;Assistance with cooking/housework;Assist for transportation;Help with stairs or ramp for entrance;Two people to help with walking and/or transfers   Equipment Recommendations  Other (comment) (defer)    Recommendations for Other Services      Precautions / Restrictions Precautions Precautions: Fall;Other (comment) Recall of Precautions/Restrictions: Intact Precaution/Restrictions Comments: significant pain from sacral wound Restrictions Weight Bearing Restrictions Per Provider Order: No       Mobility Bed Mobility               General bed mobility  comments: total +2 to scoot up in bed    Transfers                   General transfer comment: deferred     Balance                                           ADL either performed or assessed with clinical judgement   ADL Overall ADL's : Needs assistance/impaired     Grooming: Wash/dry face;Set up;Bed level   Upper Body Bathing: Moderate assistance;Bed level Upper Body Bathing Details (indicate cue type and reason): pt washes under arms but needs assist for thoroughness     Upper Body Dressing : Bed level;Moderate assistance                          Extremity/Trunk Assessment Upper Extremity Assessment Upper Extremity Assessment: Right hand dominant RUE Deficits / Details: deficits from prior CVA, 2+/5 grasp, can being hand to mouth with incr time, keeps wrist flexed but can extend wrist when cued RUE Coordination: decreased fine motor;decreased gross motor LUE Deficits / Details: hx OA, decr grasp compard to RUE, able to hold cup steady wtih cylindrical grasp LUE: Unable to fully assess due to pain;Unable to fully assess due to immobilization LUE Coordination: decreased fine motor;decreased gross motor   Lower Extremity Assessment Lower Extremity Assessment: Defer to PT evaluation RLE Deficits / Details: decreased extension ROM in hip and knee, ankle has neutral positioning, unable to move against gravity RLE Coordination: decreased fine motor;decreased  gross motor LLE Deficits / Details: RLinjury,decreased extension ROM in hip and knee, ankle has neutral positioning, unable to move against gravity LLE Coordination: decreased fine motor;decreased gross motor        Vision   Vision Assessment?: No apparent visual deficits   Perception Perception Perception: Not tested   Praxis Praxis Praxis: Not tested   Communication Communication Communication: Impaired   Cognition Arousal: Alert Behavior During Therapy: WFL for tasks  assessed/performed, Anxious Cognition: No apparent impairments             OT - Cognition Comments: pt slower processing but pleasant and follows directions well.                 Following commands: Impaired Following commands impaired: Follows one step commands with increased time      Cueing   Cueing Techniques: Verbal cues, Tactile cues  Exercises Other Exercises Other Exercises: semi-reclined functional reach x5 BUE    Shoulder Instructions       General Comments VSS    Pertinent Vitals/ Pain       Pain Assessment Pain Assessment: Faces Pain Score: 5  Faces Pain Scale: Hurts even more Pain Location: back, buttocks Pain Descriptors / Indicators: Grimacing, Guarding, Crying Pain Intervention(s): Limited activity within patient's tolerance, Monitored during session, Repositioned  Home Living                                          Prior Functioning/Environment              Frequency  Min 2X/week        Progress Toward Goals  OT Goals(current goals can now be found in the care plan section)  Progress towards OT goals: Progressing toward goals  Acute Rehab OT Goals Patient Stated Goal: none stated OT Goal Formulation: With patient/family Time For Goal Achievement: 04/16/24 Potential to Achieve Goals: Fair ADL Goals Pt Will Perform Eating: with supervision;with adaptive utensils;sitting;bed level Pt Will Perform Grooming: with supervision;bed level;with adaptive equipment Pt Will Perform Upper Body Bathing: with min assist;bed level Pt/caregiver will Perform Home Exercise Program: Both right and left upper extremity;With minimal assist;Increased ROM;Increased strength;With written HEP provided Additional ADL Goal #1: Pt will perform bed mobility with mod A for repositioning in order to prevent skin breakdown  Plan      Co-evaluation                 AM-PAC OT "6 Clicks" Daily Activity     Outcome Measure   Help  from another person eating meals?: A Lot Help from another person taking care of personal grooming?: A Lot Help from another person toileting, which includes using toliet, bedpan, or urinal?: Total Help from another person bathing (including washing, rinsing, drying)?: A Lot Help from another person to put on and taking off regular upper body clothing?: A Lot Help from another person to put on and taking off regular lower body clothing?: Total 6 Click Score: 10    End of Session    OT Visit Diagnosis: Other abnormalities of gait and mobility (R26.89);Muscle weakness (generalized) (M62.81);History of falling (Z91.81) Pain - Right/Left: Left Pain - part of body: Arm;Hand   Activity Tolerance Patient tolerated treatment well   Patient Left in bed;with call bell/phone within reach   Nurse Communication Mobility status        Time: 6578-4696 OT Time  Calculation (min): 23 min  Charges: OT General Charges $OT Visit: 1 Visit OT Treatments $Self Care/Home Management : 8-22 mins $Therapeutic Activity: 8-22 mins  Joslynne Klatt K, OTD, OTR/L SecureChat Preferred Acute Rehab [phone]   Antionette Kirks 04/02/2024, 10:46 AM

## 2024-04-03 DIAGNOSIS — R6520 Severe sepsis without septic shock: Secondary | ICD-10-CM | POA: Diagnosis not present

## 2024-04-03 DIAGNOSIS — A419 Sepsis, unspecified organism: Secondary | ICD-10-CM | POA: Diagnosis not present

## 2024-04-03 LAB — GLUCOSE, CAPILLARY
Glucose-Capillary: 175 mg/dL — ABNORMAL HIGH (ref 70–99)
Glucose-Capillary: 208 mg/dL — ABNORMAL HIGH (ref 70–99)
Glucose-Capillary: 175 mg/dL — ABNORMAL HIGH (ref 70–99)
Glucose-Capillary: 110 mg/dL — ABNORMAL HIGH (ref 70–99)

## 2024-04-03 LAB — PREALBUMIN: Prealbumin: <5 mg/dL — ABNORMAL LOW (ref 18–38)

## 2024-04-03 NOTE — Progress Notes (Signed)
 Nutrition Follow-up  DOCUMENTATION CODES:   Severe malnutrition in context of chronic illness  INTERVENTION:  - Continue 1 packet Juven BID to support wound healing, each packet provides 95 calories, 2.5 grams of protein (collagen), and 9.8 grams of carbohydrate   - Continue Magic Cup TID with meals, each supplement provides 290 kcal and 9 grams of protein   - Continue Mighty Shake TID with meals, each supplement provides 330 kcals and 9 grams of protein   - Continue Ensure Enlive po TID, each supplement provides 350 kcal and 20 grams of protein.    - Continue vitamin C  500 mg BID x 30 days to support wound healing  - Continue MVI with minerals daily  NUTRITION DIAGNOSIS:   Severe Malnutrition related to chronic illness as evidenced by energy intake < or equal to 75% for > or equal to 1 month, severe muscle depletion.  Ongoing, being addressed via oral nutrition supplements  GOAL:   Patient will meet greater than or equal to 90% of their needs  Progressing  MONITOR:   PO intake, Supplement acceptance, Weight trends, Skin  REASON FOR ASSESSMENT:   Consult Assessment of nutrition requirement/status  ASSESSMENT:   83 year old male who presented to the ED on 03/04/24 from [facility] due to sacral pain. PMH of T2DM, HTN, HLD, CHF, atrial fibrillation, stroke with right-sided weakness, GERD, BPH, hypothyroidism, MGUS, transthyretin associated amyloid cardiomyopathy, chronic pancytopenia, recent hospital admission for COVID-19. Pt admitted with sepsis secondary to sacral pressure injury infection.  4/14 - Admitted  4/15 - s/p bedside wound debridement, initiation of hydrotherapy   Pt sleeping at time of RD visit, spoke with pt daughter. Reports that pt is eating better and is able to self feed with adaptations to utensils from therapy. Reports that pt likes vanilla supplements and is still utilizing them all. Denies any nausea or vomiting, reports had a bowel movement.  Discussed adding additional items to trays to help with nursing load.   Meal Intake 5/10: 50% lunch 5/13: 100% lunch  Nutrition Related Medications: Vitamin C , Ferrous Sulfate , Lasix , NovoLog  0-15 units TID + 0-5 units daily + 4 units TID, Semglee  7 units , MVI, Juven, Phosphorus TID, Senokot-S Labs: none obtained from 5/05  CBG: 96-242 mg/dL x 24 hrs   UOP: 9629 mL + 1 unmeasured occurrence x 24 hrs  Diet Order:   Diet Order             DIET - DYS 1 Room service appropriate? Yes; Fluid consistency: Thin  Diet effective now                   EDUCATION NEEDS:   No education needs have been identified at this time  Skin:  Skin Assessment: Skin Integrity Issues: (per most recent WOC note 5/08) Skin Integrity Issues: DTI: L shoulder 2 cm x 3 cm, great toe 0.3 cm scabbed  Stage II: L hip 5 cm x 7 cm x 0.2 cm  Unstageable: sacrum 8 cm x 6 cm x 2 cm  Other: wound to R heel 1 cm x 1 , skin tear to L elbow  Last BM:  5/14 - Type 6  Height:  Ht Readings from Last 1 Encounters:  03/04/24 5\' 11"  (1.803 m)   Weight:  Wt Readings from Last 1 Encounters:  03/15/24 75.3 kg   BMI:  Body mass index is 23.15 kg/m.  Estimated Nutritional Needs:  Kcal:  2000-2200 Protein:  95-100 grams Fluid:  >2.0 L  Doneta Furbish RD, LDN Clinical Dietitian

## 2024-04-03 NOTE — Plan of Care (Signed)
  Problem: Fluid Volume: Goal: Hemodynamic stability will improve Outcome: Progressing   Problem: Clinical Measurements: Goal: Diagnostic test results will improve Outcome: Progressing Goal: Signs and symptoms of infection will decrease Outcome: Progressing   Problem: Respiratory: Goal: Ability to maintain adequate ventilation will improve Outcome: Progressing   Problem: Education: Goal: Knowledge of risk factors and measures for prevention of condition will improve Outcome: Progressing   Problem: Coping: Goal: Psychosocial and spiritual needs will be supported Outcome: Progressing   Problem: Respiratory: Goal: Will maintain a patent airway Outcome: Progressing Goal: Complications related to the disease process, condition or treatment will be avoided or minimized Outcome: Progressing   Problem: Education: Goal: Ability to describe self-care measures that may prevent or decrease complications (Diabetes Survival Skills Education) will improve Outcome: Progressing Goal: Individualized Educational Video(s) Outcome: Progressing   Problem: Coping: Goal: Ability to adjust to condition or change in health will improve Outcome: Progressing   Problem: Fluid Volume: Goal: Ability to maintain a balanced intake and output will improve Outcome: Progressing   Problem: Health Behavior/Discharge Planning: Goal: Ability to identify and utilize available resources and services will improve Outcome: Progressing Goal: Ability to manage health-related needs will improve Outcome: Progressing   Problem: Metabolic: Goal: Ability to maintain appropriate glucose levels will improve Outcome: Progressing   Problem: Nutritional: Goal: Maintenance of adequate nutrition will improve Outcome: Progressing Goal: Progress toward achieving an optimal weight will improve Outcome: Progressing   Problem: Skin Integrity: Goal: Risk for impaired skin integrity will decrease Outcome: Progressing    Problem: Tissue Perfusion: Goal: Adequacy of tissue perfusion will improve Outcome: Progressing   Problem: Education: Goal: Knowledge of General Education information will improve Description: Including pain rating scale, medication(s)/side effects and non-pharmacologic comfort measures Outcome: Progressing   Problem: Health Behavior/Discharge Planning: Goal: Ability to manage health-related needs will improve Outcome: Progressing   Problem: Clinical Measurements: Goal: Ability to maintain clinical measurements within normal limits will improve Outcome: Progressing Goal: Will remain free from infection Outcome: Progressing Goal: Diagnostic test results will improve Outcome: Progressing Goal: Respiratory complications will improve Outcome: Progressing Goal: Cardiovascular complication will be avoided Outcome: Progressing   Problem: Activity: Goal: Risk for activity intolerance will decrease Outcome: Progressing   Problem: Nutrition: Goal: Adequate nutrition will be maintained Outcome: Progressing   Problem: Coping: Goal: Level of anxiety will decrease Outcome: Progressing   Problem: Elimination: Goal: Will not experience complications related to bowel motility Outcome: Progressing Goal: Will not experience complications related to urinary retention Outcome: Progressing   Problem: Pain Managment: Goal: General experience of comfort will improve and/or be controlled Outcome: Progressing   Problem: Safety: Goal: Ability to remain free from injury will improve Outcome: Progressing   Problem: Skin Integrity: Goal: Risk for impaired skin integrity will decrease Outcome: Progressing

## 2024-04-03 NOTE — Progress Notes (Signed)
 [facility] PROGRESS NOTE    Progress Note  Earl [name]  WNU:[id number] DOB: April 24, 1941 DOA: 03/04/2024 PCP: [name], MD     Brief Narrative:   Earl [name] is an 83 y.o. male past medical history of paroxysmal atrial fibrillation on Eliquis , insulin -dependent diabetes mellitus type 2, essential hypertension, CVA with right residual weakness, MGUS, BPH, HFr EF of 40%, discharged, recently from [facility] for acute metabolic encephalopathy rhabdomyolysis and orthostatic, chronic pancytopenia presents to the ED from skilled nursing facility for shortness of breath sacral pain, in the ED was found to be in RVR, tachypneic with saturations greater than 90%.  Critical care was consulted for persistent hypotension.  Respiratory panel was negative.  General surgery was consulted for sacral wound debridement with bedside excision on 03/05/2024 and 03/07/2024.  He is now hemodynamically stable for discharge.  Assessment/Plan:  Severe sepsis secondary to chronic infected pressure ulcers stage III: Sepsis is resolved. With pain and hypotension on admission. CT scan of the abdomen pelvis shows sacral decubitus ulcer extending to the right gluteus muscle with no evidence of osteomyelitis. Seen by PCCM started empirically on antibiotics which he has completed his course on 03/21/2024. General surgery is on board he underwent bedside excisional debridement on 03/05/2024 and 03/07/2024. General surgery recommended no further debridement. ID was consulted to continue complete antibiotic treatment in house. Patient is medically stable to be transferred to skilled nursing facility.  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  See below for further details. I have explained to the daughter that this severe decubitus ulcer will take months to years to heal with good nutrition and good care, due to his comorbidities and nutritional status may be more.  Continue care  per wound care instructions. Currently on no overlay mattress. Turn every 2 hours out of bed to chair, try if possible to set him up in the chair daily. He has been eating about 20 to 50% of his meals. Ensure 3 times daily. Nutrition has been consulted.  Severe Protein Calorie Malnutrition Nutrition consult appreciated. They have recommended: 1 packet of Juven bid to support wound healing. Magic Cup tid with meals. Mighty shake tid with meals Ensure enlive Po TID. Vitamin C  500 mg bid. MVI daily.  History of paroxysmal atrial fibrillation [facility]) Cardiology is consulted currently on amiodarone  and Eliquis . Patient has had intermittent bouts of A-fib but remains asymptomatic now rate controlled. Daughter does not want to take off the telemetry.  History of HFrEF/cardiac amyloid/nonischemic cardiomyopathy: Entresto  and metoprolol  held mission. Cardiology recommended to follow-up with them as an outpatient continue low-dose Lasix . Blood pressure seems to be stable off Entresto  and metoprolol .  Left lower lobe pneumonia: Completed course of antibiotics.  Insulin -dependent diabetes mellitus type 2: With an A1c of 8.4. Continue long-acting insulin  plus sliding scale.  GERD: Stable.  Anemia of chronic disease: Status post 1 unit packed red blood cells last hemoglobin was around 9.  Hypothyroidism: Synthroid .  Constipation: Started on MiraLAX  p.o. twice daily  Stage 4 sacral decubitus ulcer present on admission RN Pressure Injury Documentation: Pressure Injury 02/26/24 Coccyx Mid Unstageable - Full thickness tissue loss in which the base of the injury is covered by slough (yellow, tan, gray, green or brown) and/or eschar (tan, brown or black) in the wound bed. (Active)  02/26/24 0916  Location: Coccyx  Location Orientation: Mid  Staging: Unstageable - Full thickness tissue loss in which the base of the injury is covered by slough (yellow, tan, gray, green or brown)  and/or  eschar (tan, brown or black) in the wound bed.  Wound Description (Comments):   Present on Admission: No  Dressing Type Foam - Lift dressing to assess site every shift 04/03/24 0100     Pressure Injury 02/26/24 Toe (Comment  which one) Anterior;Right Deep Tissue Pressure Injury - Purple or maroon localized area of discolored intact skin or blood-filled blister due to damage of underlying soft tissue from pressure and/or shear. right gre (Active)  02/26/24 1215  Location: Toe (Comment  which one)  Location Orientation: Anterior;Right  Staging: Deep Tissue Pressure Injury - Purple or maroon localized area of discolored intact skin or blood-filled blister due to damage of underlying soft tissue from pressure and/or shear.  Wound Description (Comments): right great toe  Present on Admission:   Dressing Type None 04/02/24 0500     Pressure Injury 03/06/24 Hip Anterior;Left;Proximal Deep Tissue Pressure Injury - Purple or maroon localized area of discolored intact skin or blood-filled blister due to damage of underlying soft tissue from pressure and/or shear. Fluid filled blister w (Active)  03/06/24 1612  Location: Hip  Location Orientation: Anterior;Left;Proximal  Staging: Deep Tissue Pressure Injury - Purple or maroon localized area of discolored intact skin or blood-filled blister due to damage of underlying soft tissue from pressure and/or shear.  Wound Description (Comments): Fluid filled blister with purple discoloration  Present on Admission: No  Dressing Type Foam - Lift dressing to assess site every shift 04/03/24 0100     Pressure Injury 03/06/24 Shoulder Left Deep Tissue Pressure Injury - Purple or maroon localized area of discolored intact skin or blood-filled blister due to damage of underlying soft tissue from pressure and/or shear. dark purple discoloration to should (Active)  03/06/24 1613  Location: Shoulder  Location Orientation: Left  Staging: Deep Tissue Pressure Injury -  Purple or maroon localized area of discolored intact skin or blood-filled blister due to damage of underlying soft tissue from pressure and/or shear.  Wound Description (Comments): dark purple discoloration to shoulder  Present on Admission: No  Dressing Type Foam - Lift dressing to assess site every shift 04/03/24 0100   Dysphagia: Patient on a pured diet albumin  appears to be improving. Continue Ensure. Will check a prealbumin  DVT prophylaxis: lovenox  Family Communication:none Status is: Inpatient Remains inpatient appropriate because: Awaiting skilled nursing facility placement.   Code Status:     Code Status Orders  (From admission, onward)           Start     Ordered   03/04/24 2149  Do not attempt resuscitation (DNR)- Limited -Do Not Intubate (DNI)  Continuous       Question Answer Comment  If pulseless and not breathing No CPR or chest compressions.   In Pre-Arrest Conditions (Patient Is Breathing and Has A Pulse) Do not intubate. Provide all appropriate non-invasive medical interventions. Avoid ICU transfer unless indicated or required.   Consent: Discussion documented in EHR or advanced directives reviewed      03/04/24 2150           Code Status History     Date Active Date Inactive Code Status Order ID Comments User Context   02/25/2024 1652 02/29/2024 1738 Limited: Do not attempt resuscitation (DNR) -DNR-LIMITED -Do Not Intubate/DNI  [id number]  Annette Killings, NP Inpatient   02/15/2024 0814 02/25/2024 1652 Full Code [id number]  Hoyt Macleod, MD ED   11/14/2023 2326 11/16/2023 2302 Full Code [id number]  Jetty Mort, MD ED   10/07/2021 1136 10/07/2021  1942 Full Code [id number]  Darlis Eisenmenger, MD Inpatient   07/28/2017 1834 08/08/2017 1437 Full Code [id number]  Jairo Mayer Inpatient   07/28/2017 1834 07/28/2017 1834 Full Code [id number]  Jairo Mayer Inpatient   07/26/2017 0109 07/28/2017 1828 Full Code [id number]  Pati Bonine, MD Inpatient          IV Access:   Peripheral IV   Procedures and diagnostic studies:   No results found.   Medical Consultants:   None.   Subjective:    Earl [name] he was sleeping this morning.  Objective:    Vitals:   04/02/24 2027 04/03/24 0446 04/03/24 0805 04/03/24 1458  BP: 119/68 117/65 125/71 108/62  Pulse: 78 80 85 79  Resp: 18 18 18 20   Temp: 98.4 F (36.9 C) 98.4 F (36.9 C) 98.9 F (37.2 C) 98.3 F (36.8 C)  TempSrc:  Oral Oral Oral  SpO2: 100% 97% 99% 100%  Weight:      Height:       SpO2: 100 %   Intake/Output Summary (Last 24 hours) at 04/03/2024 1748 Last data filed at 04/03/2024 0515 Gross per 24 hour  Intake --  Output 1100 ml  Net -1100 ml   Filed Weights   03/04/24 1656 03/15/24 0500  Weight: 68.5 kg 75.3 kg    Exam: General exam: In no acute distress. Respiratory system: Good air movement and clear to auscultation. Cardiovascular system: S1 & S2 heard, RRR. No JVD. Gastrointestinal system: Abdomen is nondistended, soft and nontender.  Skin: No rashes, lesions or ulcers   Data Reviewed:    Labs: Basic Metabolic Panel: Recent Labs  Lab 03/28/24 1210  PHOS 3.4   GFR Estimated Creatinine Clearance: 75.8 mL/min (by C-G formula based on SCr of 0.67 mg/dL). Liver Function Tests: No results for input(s): "AST", "ALT", "ALKPHOS", "BILITOT", "PROT", "ALBUMIN " in the last 168 hours.  No results for input(s): "LIPASE", "AMYLASE" in the last 168 hours. No results for input(s): "AMMONIA" in the last 168 hours. Coagulation profile No results for input(s): "INR", "PROTIME" in the last 168 hours. COVID-19 Labs  No results for input(s): "DDIMER", "FERRITIN", "LDH", "CRP" in the last 72 hours.  Lab Results  Component Value Date   SARSCOV2NAA POSITIVE (A) 03/04/2024   SARSCOV2NAA POSITIVE (A) 02/14/2024   SARSCOV2NAA NEGATIVE 06/07/2019    CBC: No results for input(s): "WBC", "NEUTROABS", "HGB", "HCT", "MCV", "PLT" in the last 168  hours.  Cardiac Enzymes: No results for input(s): "CKTOTAL", "CKMB", "CKMBINDEX", "TROPONINI" in the last 168 hours. BNP (last 3 results) No results for input(s): "PROBNP" in the last 8760 hours. CBG: Recent Labs  Lab 04/02/24 1529 04/02/24 2031 04/03/24 0801 04/03/24 1250 04/03/24 1620  GLUCAP 187* 130* 175* 208* 175*   D-Dimer: No results for input(s): "DDIMER" in the last 72 hours. Hgb A1c: No results for input(s): "HGBA1C" in the last 72 hours. Lipid Profile: No results for input(s): "CHOL", "HDL", "LDLCALC", "TRIG", "CHOLHDL", "LDLDIRECT" in the last 72 hours. Thyroid  function studies: No results for input(s): "TSH", "T4TOTAL", "T3FREE", "THYROIDAB" in the last 72 hours.  Invalid input(s): "FREET3" Anemia work up: No results for input(s): "VITAMINB12", "FOLATE", "FERRITIN", "TIBC", "IRON", "RETICCTPCT" in the last 72 hours. Sepsis Labs: No results for input(s): "PROCALCITON", "WBC", "LATICACIDVEN" in the last 168 hours.  Microbiology No results found for this or any previous visit (from the past 240 hours).   Medications:    acetaminophen   500 mg Oral TID   amiodarone   200 mg Oral Daily   apixaban   5 mg Oral BID   ascorbic acid   500 mg Oral BID   atorvastatin   40 mg Oral Daily   Chlorhexidine  Gluconate Cloth  6 each Topical Daily   diclofenac  Sodium  4 g Topical QID   feeding supplement  237 mL Oral TID BM   ferrous sulfate   325 mg Oral QHS   furosemide   20 mg Oral Daily   guaiFENesin   600 mg Oral BID   insulin  aspart  0-15 Units Subcutaneous TID WC   insulin  aspart  0-5 Units Subcutaneous QHS   insulin  aspart  4 Units Subcutaneous TID WC   insulin  glargine-yfgn  7 Units Subcutaneous Daily   levothyroxine   50 mcg Oral Q0600   liver oil-zinc  oxide   Topical BID   midodrine   5 mg Oral TID WC   multivitamin with minerals  1 tablet Oral Daily   nutrition supplement (JUVEN)  1 packet Oral BID BM   oxyCODONE   10 mg Oral Q12H   phosphorus  500 mg Oral TID    senna-docusate  1 tablet Oral QHS   sodium chloride  flush  3 mL Intravenous Q12H   Tafamidis   1 capsule Oral Daily   Continuous Infusions:    LOS: 30 days   Kyndall Amero  [facility]  04/03/2024, 5:48 PM

## 2024-04-03 NOTE — Plan of Care (Signed)
 Assumed care at 1900. Pt has been turned and reposition overnight. No complications overnight. See MAR.    Problem: Fluid Volume: Goal: Hemodynamic stability will improve Outcome: Progressing   Problem: Clinical Measurements: Goal: Diagnostic test results will improve Outcome: Progressing Goal: Signs and symptoms of infection will decrease Outcome: Progressing   Problem: Respiratory: Goal: Ability to maintain adequate ventilation will improve Outcome: Progressing   Problem: Education: Goal: Knowledge of risk factors and measures for prevention of condition will improve Outcome: Progressing   Problem: Coping: Goal: Psychosocial and spiritual needs will be supported Outcome: Progressing   Problem: Respiratory: Goal: Will maintain a patent airway Outcome: Progressing Goal: Complications related to the disease process, condition or treatment will be avoided or minimized Outcome: Progressing   Problem: Education: Goal: Ability to describe self-care measures that may prevent or decrease complications (Diabetes Survival Skills Education) will improve Outcome: Progressing Goal: Individualized Educational Video(s) Outcome: Progressing   Problem: Coping: Goal: Ability to adjust to condition or change in health will improve Outcome: Progressing   Problem: Fluid Volume: Goal: Ability to maintain a balanced intake and output will improve Outcome: Progressing   Problem: Health Behavior/Discharge Planning: Goal: Ability to identify and utilize available resources and services will improve Outcome: Progressing Goal: Ability to manage health-related needs will improve Outcome: Progressing   Problem: Metabolic: Goal: Ability to maintain appropriate glucose levels will improve Outcome: Progressing   Problem: Nutritional: Goal: Maintenance of adequate nutrition will improve Outcome: Progressing Goal: Progress toward achieving an optimal weight will improve Outcome:  Progressing   Problem: Skin Integrity: Goal: Risk for impaired skin integrity will decrease Outcome: Progressing   Problem: Tissue Perfusion: Goal: Adequacy of tissue perfusion will improve Outcome: Progressing   Problem: Education: Goal: Knowledge of General Education information will improve Description: Including pain rating scale, medication(s)/side effects and non-pharmacologic comfort measures Outcome: Progressing   Problem: Health Behavior/Discharge Planning: Goal: Ability to manage health-related needs will improve Outcome: Progressing   Problem: Clinical Measurements: Goal: Ability to maintain clinical measurements within normal limits will improve Outcome: Progressing Goal: Will remain free from infection Outcome: Progressing Goal: Diagnostic test results will improve Outcome: Progressing Goal: Respiratory complications will improve Outcome: Progressing Goal: Cardiovascular complication will be avoided Outcome: Progressing   Problem: Activity: Goal: Risk for activity intolerance will decrease Outcome: Progressing   Problem: Nutrition: Goal: Adequate nutrition will be maintained Outcome: Progressing   Problem: Coping: Goal: Level of anxiety will decrease Outcome: Progressing   Problem: Elimination: Goal: Will not experience complications related to bowel motility Outcome: Progressing Goal: Will not experience complications related to urinary retention Outcome: Progressing   Problem: Pain Managment: Goal: General experience of comfort will improve and/or be controlled Outcome: Progressing   Problem: Safety: Goal: Ability to remain free from injury will improve Outcome: Progressing   Problem: Skin Integrity: Goal: Risk for impaired skin integrity will decrease Outcome: Progressing

## 2024-04-04 DIAGNOSIS — R6520 Severe sepsis without septic shock: Secondary | ICD-10-CM | POA: Diagnosis not present

## 2024-04-04 DIAGNOSIS — A419 Sepsis, unspecified organism: Secondary | ICD-10-CM | POA: Diagnosis not present

## 2024-04-04 LAB — CBC WITH DIFFERENTIAL/PLATELET
Abs Immature Granulocytes: 0.04 10*3/uL (ref 0.00–0.07)
Basophils Absolute: 0.1 10*3/uL (ref 0.0–0.1)
Basophils Relative: 1 %
Eosinophils Absolute: 0.2 10*3/uL (ref 0.0–0.5)
Eosinophils Relative: 3 %
HCT: 29.2 % — ABNORMAL LOW (ref 39.0–52.0)
Hemoglobin: 9.5 g/dL — ABNORMAL LOW (ref 13.0–17.0)
Immature Granulocytes: 1 %
Lymphocytes Relative: 26 %
Lymphs Abs: 1.8 10*3/uL (ref 0.7–4.0)
MCH: 28.1 pg (ref 26.0–34.0)
MCHC: 32.5 g/dL (ref 30.0–36.0)
MCV: 86.4 fL (ref 80.0–100.0)
Monocytes Absolute: 0.8 10*3/uL (ref 0.1–1.0)
Monocytes Relative: 12 %
Neutro Abs: 4 10*3/uL (ref 1.7–7.7)
Neutrophils Relative %: 57 %
Platelets: 423 10*3/uL — ABNORMAL HIGH (ref 150–400)
RBC: 3.38 MIL/uL — ABNORMAL LOW (ref 4.22–5.81)
RDW: 16.1 % — ABNORMAL HIGH (ref 11.5–15.5)
WBC: 6.9 10*3/uL (ref 4.0–10.5)
nRBC: 0 % (ref 0.0–0.2)

## 2024-04-04 LAB — GLUCOSE, CAPILLARY
Glucose-Capillary: 136 mg/dL — ABNORMAL HIGH (ref 70–99)
Glucose-Capillary: 200 mg/dL — ABNORMAL HIGH (ref 70–99)
Glucose-Capillary: 133 mg/dL — ABNORMAL HIGH (ref 70–99)
Glucose-Capillary: 132 mg/dL — ABNORMAL HIGH (ref 70–99)

## 2024-04-04 MED ORDER — COLLAGENASE 250 UNIT/GM EX OINT
TOPICAL_OINTMENT | Freq: Every day | CUTANEOUS | Status: DC
  Administered 2024-04-08 – 2024-06-22 (×5): 1 via TOPICAL
  Filled 2024-04-04 (×7): qty 30

## 2024-04-04 NOTE — Plan of Care (Signed)
 Assumed care at 1900. Pt has been resting comfortably in bed overnight. No significant events occurred.    Problem: Fluid Volume: Goal: Hemodynamic stability will improve Outcome: Progressing   Problem: Education: Goal: Ability to describe self-care measures that may prevent or decrease complications (Diabetes Survival Skills Education) will improve Outcome: Progressing   Problem: Nutritional: Goal: Maintenance of adequate nutrition will improve Outcome: Progressing   Problem: Skin Integrity: Goal: Risk for impaired skin integrity will decrease Outcome: Progressing

## 2024-04-04 NOTE — Plan of Care (Signed)
  Problem: Fluid Volume: Goal: Hemodynamic stability will improve Outcome: Progressing   Problem: Clinical Measurements: Goal: Diagnostic test results will improve Outcome: Progressing Goal: Signs and symptoms of infection will decrease Outcome: Progressing   Problem: Respiratory: Goal: Ability to maintain adequate ventilation will improve Outcome: Progressing   Problem: Education: Goal: Knowledge of risk factors and measures for prevention of condition will improve Outcome: Progressing   Problem: Coping: Goal: Psychosocial and spiritual needs will be supported Outcome: Progressing   Problem: Respiratory: Goal: Will maintain a patent airway Outcome: Progressing Goal: Complications related to the disease process, condition or treatment will be avoided or minimized Outcome: Progressing   Problem: Education: Goal: Ability to describe self-care measures that may prevent or decrease complications (Diabetes Survival Skills Education) will improve Outcome: Progressing Goal: Individualized Educational Video(s) Outcome: Progressing   Problem: Coping: Goal: Ability to adjust to condition or change in health will improve Outcome: Progressing   Problem: Fluid Volume: Goal: Ability to maintain a balanced intake and output will improve Outcome: Progressing   Problem: Health Behavior/Discharge Planning: Goal: Ability to identify and utilize available resources and services will improve Outcome: Progressing Goal: Ability to manage health-related needs will improve Outcome: Progressing   Problem: Metabolic: Goal: Ability to maintain appropriate glucose levels will improve Outcome: Progressing   Problem: Nutritional: Goal: Maintenance of adequate nutrition will improve Outcome: Progressing Goal: Progress toward achieving an optimal weight will improve Outcome: Progressing   Problem: Skin Integrity: Goal: Risk for impaired skin integrity will decrease Outcome: Progressing    Problem: Tissue Perfusion: Goal: Adequacy of tissue perfusion will improve Outcome: Progressing   Problem: Education: Goal: Knowledge of General Education information will improve Description: Including pain rating scale, medication(s)/side effects and non-pharmacologic comfort measures Outcome: Progressing   Problem: Health Behavior/Discharge Planning: Goal: Ability to manage health-related needs will improve Outcome: Progressing   Problem: Clinical Measurements: Goal: Ability to maintain clinical measurements within normal limits will improve Outcome: Progressing Goal: Will remain free from infection Outcome: Progressing Goal: Diagnostic test results will improve Outcome: Progressing Goal: Respiratory complications will improve Outcome: Progressing Goal: Cardiovascular complication will be avoided Outcome: Progressing   Problem: Activity: Goal: Risk for activity intolerance will decrease Outcome: Progressing   Problem: Nutrition: Goal: Adequate nutrition will be maintained Outcome: Progressing   Problem: Coping: Goal: Level of anxiety will decrease Outcome: Progressing   Problem: Elimination: Goal: Will not experience complications related to bowel motility Outcome: Progressing Goal: Will not experience complications related to urinary retention Outcome: Progressing   Problem: Pain Managment: Goal: General experience of comfort will improve and/or be controlled Outcome: Progressing   Problem: Safety: Goal: Ability to remain free from injury will improve Outcome: Progressing   Problem: Skin Integrity: Goal: Risk for impaired skin integrity will decrease Outcome: Progressing

## 2024-04-04 NOTE — Consult Note (Addendum)
 WOC Nurse wound follow up Wound type: Full thickness pressure injuries. UPDATE ORDERS  1 - Left Hip PI stage 2 Measurement: 5 cm x 7 cm x 0.2 cm  Wound bed: 100% red, blister ruptured, partial raised skin. Drainage (amount, consistency, odor) Moderated weeping, no odor, serous. Periwound: intact Dressing procedure/placement/frequency: Apply Xeroform, cover with foam dressing, change daily.   2 - Left shoulder DTPI Measurement: 2 cm x 3 cm Wound bed: 100% brown eschar, blister ruptured. Drainage (amount, consistency, odor) Minimum weeping, no odor, serous. Periwound: intact Dressing procedure/placement/frequency: Apply Santyl  (change daily) and cover with foam dressing, change every 3 days or PRN.   3 - Sacral Unstageable PI Measurement: 8 cm x 6 cm x 2 cm Wound bed: 100% red, detachment 0.5 cm at 3 o'clock. Drainage (amount, consistency, odor) Minimum amount, no odor, serous, no signs of infection Periwound: intacts, white scar tissue surrounding, viable edges. Dressing procedure/placement/frequency: Cleanse with Vashe#[id number]. Apply Xeroform on the wound bed and fill the tunneling using a swab (change daily) , cover with sacral foam dressing, can be change every 3 days or PRN soiling.    4 - Right heel Measurement: 2 x 1.2 cm Wound bed: no skin breakdown, black/purple coloration. Drainage (amount, consistency, odor) NONE Periwound: Intact, dry Dressing procedure/placement/frequency: Protect with foam dressing, change every 3 days or PRN.  5 e 6 - Right great toe: distal end 0.2 cm scabbed, skin intact. Left great toe 0.3 cm scabbed, skin intact. Dressing procedure/placement/frequency: Leave open air.   WOC team will follow weekly. Please reconsult if further assistance is needed. Thank-you,  Rachel Budds BSN, RN, [employer], WOC  (Pager: [phone])

## 2024-04-04 NOTE — Progress Notes (Signed)
 [facility] PROGRESS NOTE    Progress Note  Earl [name]  BJY:[id number] DOB: Nov 07, 1941 DOA: 03/04/2024 PCP: [name], MD     Brief Narrative:   Earl [name] is an 83 y.o. 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  Critical care was consulted for persistent hypotension.  Respiratory panel was negative.  General surgery was consulted for sacral wound debridement with bedside excision on 03/05/2024 and 03/07/2024.  He is now hemodynamically stable for discharge.  On f/15/2025 the patient's daughter is upset that wound care has now stated that dressing changes will only happen every 3 days. Nursing agrees. I will discuss this with wound care tomorrow.  Assessment/Plan:  Severe sepsis secondary to chronic infected pressure ulcers stage III: Sepsis is resolved. With pain and hypotension on admission. CT scan of the abdomen pelvis shows sacral decubitus ulcer extending to the right gluteus muscle with no evidence of osteomyelitis. Seen by PCCM started empirically on antibiotics which he has completed his course on 03/21/2024. General surgery is on board he underwent bedside excisional debridement on 03/05/2024 and 03/07/2024. General surgery recommended no further debridement. ID was consulted to continue complete antibiotic treatment in house. Patient is medically stable to be transferred to skilled nursing facility.  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  See below for further details. I have explained  to the daughter that this severe decubitus ulcer will take months to years to heal with good nutrition and good care, due to his comorbidities and nutritional status may be more.  Continue care per wound care instructions. Currently on no overlay mattress. Turn every 2 hours out of bed to chair, try if possible to set him up in the chair daily. He has been eating about 20 to 50% of his meals. Ensure 3 times daily. Nutrition has been consulted.  Severe Protein Calorie Malnutrition Prealbumin of less than 5.  Nutrition consult appreciated. They have recommended: 1 packet of Juven bid to support wound healing. Magic Cup tid with meals. Mighty shake tid with meals Ensure enlive Po TID. Vitamin C  500 mg bid. MVI daily.  History of paroxysmal atrial fibrillation [facility]) Cardiology is consulted currently on amiodarone  and Eliquis . Patient has had intermittent bouts of A-fib but remains asymptomatic now rate controlled. Daughter does not want to take off the telemetry.  History of HFrEF/cardiac amyloid/nonischemic cardiomyopathy: Entresto  and metoprolol  held mission. Cardiology recommended to follow-up with them as an outpatient continue low-dose Lasix . Blood pressure seems to be stable off Entresto  and metoprolol .  Left lower lobe pneumonia: Completed course of antibiotics.  Insulin -dependent diabetes mellitus type 2: With an A1c of 8.4. Continue long-acting insulin  plus sliding scale.  GERD: Stable.  Anemia of chronic disease: Status post 1 unit packed red blood cells last hemoglobin was around 9.  Hypothyroidism: Synthroid .  Constipation: Started on MiraLAX  p.o. twice daily  Stage 4 sacral decubitus ulcer present on admission RN Pressure Injury Documentation: Pressure Injury 02/26/24 Coccyx Mid Unstageable - Full thickness tissue loss in which the base of the injury is covered by slough (yellow, tan, gray, green or brown) and/or eschar (tan, brown or black) in  the wound bed.  (Active)  02/26/24 0916  Location: Coccyx  Location Orientation: Mid  Staging: Unstageable - Full thickness tissue loss in which the base of the injury is covered by slough (yellow, tan, gray, green or brown) and/or eschar (tan, brown or black) in the wound bed.  Wound Description (Comments):   Present on Admission: No  Dressing Type Foam - Lift dressing to assess site every shift 04/03/24 2353     Pressure Injury 02/26/24 Toe (Comment  which one) Anterior;Right Deep Tissue Pressure Injury - Purple or maroon localized area of discolored intact skin or blood-filled blister due to damage of underlying soft tissue from pressure and/or shear. right gre (Active)  02/26/24 1215  Location: Toe (Comment  which one)  Location Orientation: Anterior;Right  Staging: Deep Tissue Pressure Injury - Purple or maroon localized area of discolored intact skin or blood-filled blister due to damage of underlying soft tissue from pressure and/or shear.  Wound Description (Comments): right great toe  Present on Admission:   Dressing Type None 04/02/24 0500     Pressure Injury 03/06/24 Hip Anterior;Left;Proximal Deep Tissue Pressure Injury - Purple or maroon localized area of discolored intact skin or blood-filled blister due to damage of underlying soft tissue from pressure and/or shear. Fluid filled blister w (Active)  03/06/24 1612  Location: Hip  Location Orientation: Anterior;Left;Proximal  Staging: Deep Tissue Pressure Injury - Purple or maroon localized area of discolored intact skin or blood-filled blister due to damage of underlying soft tissue from pressure and/or shear.  Wound Description (Comments): Fluid filled blister with purple discoloration  Present on Admission: No  Dressing Type Foam - Lift dressing to assess site every shift 04/03/24 2353     Pressure Injury 03/06/24 Shoulder Left Deep Tissue Pressure Injury - Purple or maroon localized area of discolored intact skin or blood-filled blister  due to damage of underlying soft tissue from pressure and/or shear. dark purple discoloration to should (Active)  03/06/24 1613  Location: Shoulder  Location Orientation: Left  Staging: Deep Tissue Pressure Injury - Purple or maroon localized area of discolored intact skin or blood-filled blister due to damage of underlying soft tissue from pressure and/or shear.  Wound Description (Comments): dark purple discoloration to shoulder  Present on Admission: No  Dressing Type Foam - Lift dressing to assess site every shift 04/03/24 2353   Dysphagia: Patient on a pured diet albumin  appears to be improving. Continue Ensure. Will check a prealbumin  DVT prophylaxis: lovenox  Family Communication:none Status is: Inpatient Remains inpatient appropriate because: Awaiting skilled nursing facility placement.   Code Status:     Code Status Orders  (From admission, onward)           Start     Ordered   03/04/24 2149  Do not attempt resuscitation (DNR)- Limited -Do Not Intubate (DNI)  Continuous       Question Answer Comment  If pulseless and not breathing No CPR or chest compressions.   In Pre-Arrest Conditions (Patient Is Breathing and Has A Pulse) Do not intubate. Provide all appropriate non-invasive medical interventions. Avoid ICU transfer unless indicated or required.   Consent: Discussion documented in EHR or advanced directives reviewed      03/04/24 2150           Code Status History     Date Active Date Inactive Code Status Order ID Comments User Context   02/25/2024 1652 02/29/2024 1738 Limited: Do not attempt resuscitation (DNR) -DNR-LIMITED -Do Not Intubate/DNI  [id number]  Annette Killings, NP Inpatient   02/15/2024 1610 02/25/2024 1652 Full Code [id number]  Hoyt Macleod, MD ED   11/14/2023 2326 11/16/2023 2302 Full Code [id number]  Jetty Mort, MD ED   10/07/2021 1136 10/07/2021 1942 Full Code [id number]  Darlis Eisenmenger, MD Inpatient   07/28/2017 1834 08/08/2017 1437 Full  Code [id number]  Zelda Hickman, PA-C Inpatient   07/28/2017 1834 07/28/2017 1834 Full Code [id number]  Jairo Mayer Inpatient   07/26/2017 0109 07/28/2017 1828 Full Code [id number]  Pati Bonine, MD Inpatient         IV Access:   Peripheral IV   Procedures and diagnostic studies:   No results found.   Medical Consultants:   None.   Subjective:    Earl [name] he was sleeping this morning.  Objective:    Vitals:   04/04/24 0533 04/04/24 0803 04/04/24 1230 04/04/24 1634  BP: 113/75 111/65 112/65 113/69  Pulse: 81 84 83 77  Resp: 19     Temp: 98.4 F (36.9 C) 98.4 F (36.9 C) 97.8 F (36.6 C) 97.6 F (36.4 C)  TempSrc:   Oral Oral  SpO2: 100% 100% 99% 99%  Weight:      Height:       SpO2: 99 %   Intake/Output Summary (Last 24 hours) at 04/04/2024 1825 Last data filed at 04/04/2024 1524 Gross per 24 hour  Intake --  Output 1500 ml  Net -1500 ml   Filed Weights   03/04/24 1656 03/15/24 0500  Weight: 68.5 kg 75.3 kg    Exam: General exam: In no acute distress. Respiratory system: Good air movement and clear to auscultation. Cardiovascular system: S1 & S2 heard, RRR. No JVD. Gastrointestinal system: Abdomen is nondistended, soft and nontender.  Skin: No rashes, lesions or ulcers   Data Reviewed:    Labs: Basic Metabolic Panel: No results for input(s): "NA", "K", "CL", "CO2", "GLUCOSE", "BUN", "CREATININE", "CALCIUM ", "MG", "PHOS" in the last 168 hours.  GFR Estimated Creatinine Clearance: 75.8 mL/min (by C-G formula based on SCr of 0.67 mg/dL). Liver Function Tests: No results for input(s): "AST", "ALT", "ALKPHOS", "BILITOT", "PROT", "ALBUMIN " in the last 168 hours.  No results for input(s): "LIPASE", "AMYLASE" in the last 168 hours. No results for input(s): "AMMONIA" in the last 168 hours. Coagulation profile No results for input(s): "INR", "PROTIME" in the last 168 hours. COVID-19 Labs  No results for input(s): "DDIMER",  "FERRITIN", "LDH", "CRP" in the last 72 hours.  Lab Results  Component Value Date   SARSCOV2NAA POSITIVE (A) 03/04/2024   SARSCOV2NAA POSITIVE (A) 02/14/2024   SARSCOV2NAA NEGATIVE 06/07/2019    CBC: Recent Labs  Lab 04/04/24 0647  WBC 6.9  NEUTROABS 4.0  HGB 9.5*  HCT 29.2*  MCV 86.4  PLT 423*    Cardiac Enzymes: No results for input(s): "CKTOTAL", "CKMB", "CKMBINDEX", "TROPONINI" in the last 168 hours. BNP (last 3 results) No results for input(s): "PROBNP" in the last 8760 hours. CBG: Recent Labs  Lab 04/03/24 1620 04/03/24 2047 04/04/24 0803 04/04/24 1204 04/04/24 1633  GLUCAP 175* 110* 136* 200* 133*   D-Dimer: No results for input(s): "DDIMER" in the last 72 hours. Hgb A1c: No results for input(s): "HGBA1C" in the last 72 hours. Lipid Profile: No results for input(s): "CHOL", "HDL", "LDLCALC", "TRIG", "CHOLHDL", "LDLDIRECT" in the last 72 hours. Thyroid  function studies: No results for input(s): "TSH", "T4TOTAL", "T3FREE", "THYROIDAB" in the last 72 hours.  Invalid input(s): "FREET3" Anemia work up: No results for  input(s): "VITAMINB12", "FOLATE", "FERRITIN", "TIBC", "IRON", "RETICCTPCT" in the last 72 hours. Sepsis Labs: Recent Labs  Lab 04/04/24 0647  WBC 6.9    Microbiology No results found for this or any previous visit (from the past 240 hours).   Medications:    acetaminophen   500 mg Oral TID   amiodarone   200 mg Oral Daily   apixaban   5 mg Oral BID   ascorbic acid   500 mg Oral BID   atorvastatin   40 mg Oral Daily   Chlorhexidine  Gluconate Cloth  6 each Topical Daily   collagenase    Topical Daily   diclofenac  Sodium  4 g Topical QID   feeding supplement  237 mL Oral TID BM   ferrous sulfate   325 mg Oral QHS   furosemide   20 mg Oral Daily   guaiFENesin   600 mg Oral BID   insulin  aspart  0-15 Units Subcutaneous TID WC   insulin  aspart  0-5 Units Subcutaneous QHS   insulin  aspart  4 Units Subcutaneous TID WC   insulin  glargine-yfgn  7  Units Subcutaneous Daily   levothyroxine   50 mcg Oral Q0600   liver oil-zinc  oxide   Topical BID   midodrine   5 mg Oral TID WC   multivitamin with minerals  1 tablet Oral Daily   nutrition supplement (JUVEN)  1 packet Oral BID BM   oxyCODONE   10 mg Oral Q12H   phosphorus  500 mg Oral TID   senna-docusate  1 tablet Oral QHS   sodium chloride  flush  3 mL Intravenous Q12H   Tafamidis   1 capsule Oral Daily   Continuous Infusions:    LOS: 31 days   Jashayla Glatfelter  [facility]  04/04/2024, 6:25 PM

## 2024-04-04 NOTE — Progress Notes (Signed)
 Physical Therapy Treatment Patient Details Name: Earl [name] MRN: [id number] DOB: 08-22-1941 Today's Date: 04/04/2024   History of Present Illness 83 y.o. male admitted from [facility] SNF rehab on 03/04/24 with SOB, sacral pain. Workup for acute metabolic encephalopathy, rhabdomyolysis, sepsis secondary to sacral wound infection, afib with RVR. S/p bedside sacral wound debridement 4/15 and 4/17. Of note, recent admit 3/26-4/08/2024 with fall, AMS, COVID; d/c to SNF rehab. Other PMH includes CVA (2021, residual R-side weakness), DM2, HTN, CHF, MGUS, afib on Eliquis .    PT Comments  Pt received in supine after premedication by RN, pt pleasantly agreeable to therapy session, pt's daughter Earl [name] present and encouraging him. Pt needing up to maxA +2 to perform bed mobility and able to tolerate sitting ~10 mins with variable CGA to maxA with frequent posterior lean and needs consistent cues for core activation and BUE placement to self-support with rails/mattress. Noisy signal when checking SpO2/HR but SpO2 100% reading on RA when signal achieved, HR reading variable 100-168 bpm, RN aware. Patient will benefit from continued inpatient follow up therapy, <3 hours/day, he is slowly progressing toward his goals. Pt would benefit from hoyer OOB to chair for 30-60 mins on non-therapy days but may not be able to tolerate due to pain/sacral wound.   If plan is discharge home, recommend the following: Two people to help with walking and/or transfers;Two people to help with bathing/dressing/bathroom;Assistance with feeding;Assist for transportation   Can travel by private vehicle     No  Equipment Recommendations  Hospital bed;Hoyer lift (air mattress due to wounds)    Recommendations for Other Services       Precautions / Restrictions Precautions Precautions: Fall;Other (comment) Recall of Precautions/Restrictions: Intact Precaution/Restrictions Comments: significant pain from sacral  wound Restrictions Weight Bearing Restrictions Per Provider Order: No     Mobility  Bed Mobility Overal bed mobility: Needs Assistance Bed Mobility: Rolling, Sidelying to Sit, Sit to Sidelying Rolling: Max assist, Used rails Sidelying to sit: Max assist, HOB elevated, Used rails, +2 for physical assistance   Sit to supine: Max assist, +2 for physical assistance, Used rails   General bed mobility comments: total +2 to scoot up in bed; log roll to L EOB to sit up, PTA left a pillow under his hips for comfort while transferring to sit EOB. Posterior lean sitting EOB, pt needing BUE support throughout.    Transfers                   General transfer comment: pt not able to perform, posterior lean sitting EOB and needing +1-2 assist constantly to maintain upright posture at EOB; defer lateral scooting due to pt sacral wounds    Ambulation/Gait                   Stairs             Wheelchair Mobility     Tilt Bed    Modified Rankin (Stroke Patients Only)       Balance Overall balance assessment: Needs assistance Sitting-balance support: Bilateral upper extremity supported, Feet supported Sitting balance-Leahy Scale: Poor Sitting balance - Comments: Pt sat EOB with MaxA to CGA for a total of 10 min. Pt was able to hold balance with mod cues with BUE support (L bed rail and RUE pushing down into air mattress as foot of bed rail was not close enough for good posture. Posterior lean throughout, improves briefly with cues. Postural control: Posterior lean  Standing balance comment: not safe to attempt this date                            Communication Communication Communication: Impaired  Cognition Arousal: Alert Behavior During Therapy: WFL for tasks assessed/performed, Anxious   PT - Cognitive impairments: Problem solving, Memory, Initiation, Sequencing                       PT - Cognition Comments: Pt pleasantly  cooperative, RN had premedicated him according to discussion earlier in the day and pt following simple commands well as he is able. Following commands: Impaired Following commands impaired: Follows one step commands with increased time    Cueing Cueing Techniques: Verbal cues, Tactile cues  Exercises General Exercises - Lower Extremity Heel Slides: AAROM, Both, 5 reps, Supine Hip ABduction/ADduction: AAROM, Both, Supine (a few reps) Other Exercises Other Exercises: seated anterior lean to midline x10 reps with cues for core activation and rest breaks to recover sitting EOB; BUE supported Other Exercises: seated lateral lean to L/R sides x3 reps Other Exercises: pt has HEP handout per daughter, PTA encouraging them to perform TID on non-therapy days Other Exercises: reclined BUE reaching x3 reps ea    General Comments General comments (skin integrity, edema, etc.): Discussion on pressure relief frequency/strategies with daughter Earl [name] reporting they have a good plan in place with nursing staff currently, SpO2 WFL on RA, HR reading variable per dinamap on his R hand reading 100-168 but mostly 100-120's bpm (possible Afib pattern given large swings in reading but PTA not sure as it could be related to skin temperature; RN notified)      Pertinent Vitals/Pain Pain Assessment Pain Assessment: Faces Faces Pain Scale: Hurts little more Pain Location: back, buttocks Pain Descriptors / Indicators: Grimacing, Guarding Pain Intervention(s): Limited activity within patient's tolerance, Monitored during session, Premedicated before session, Repositioned    Home Living                          Prior Function            PT Goals (current goals can now be found in the care plan section) Acute Rehab PT Goals Patient Stated Goal: To improve strength and mobility to reduce risk for falls. PT Goal Formulation: With patient/family Time For Goal Achievement: 04/11/24 Progress towards PT  goals: Progressing toward goals    Frequency    Min 1X/week      PT Plan      Co-evaluation              AM-PAC PT "6 Clicks" Mobility   Outcome Measure  Help needed turning from your back to your side while in a flat bed without using bedrails?: Total Help needed moving from lying on your back to sitting on the side of a flat bed without using bedrails?: Total Help needed moving to and from a bed to a chair (including a wheelchair)?: Total Help needed standing up from a chair using your arms (e.g., wheelchair or bedside chair)?: Total Help needed to walk in hospital room?: Total Help needed climbing 3-5 steps with a railing? : Total 6 Click Score: 6    End of Session Equipment Utilized During Treatment: Other (comment) (bed pad assist) Activity Tolerance: Patient tolerated treatment well;Patient limited by fatigue Patient left: in bed;with call bell/phone within reach;with family/visitor present;Other (comment) (x4 rails up per  air bed; prevalon boots donned, HOB >30 deg to promote improved pulm clearance) Nurse Communication: Mobility status;Need for lift equipment;Other (comment) (pt back in supine can be rolled to L/R in a couple hours) PT Visit Diagnosis: Muscle weakness (generalized) (M62.81);Adult, failure to thrive (R62.7);Difficulty in walking, not elsewhere classified (R26.2);Pain Pain - part of body:  (sacrum and R knee)     Time: 0981-1914 PT Time Calculation (min) (ACUTE ONLY): 23 min  Charges:    $Therapeutic Exercise: 8-22 mins $Therapeutic Activity: 8-22 mins PT General Charges $$ ACUTE PT VISIT: 1 Visit                     Glorene Leitzke P., PTA Acute Rehabilitation Services Secure Chat Preferred 9a-5:30pm Office: [phone]    Mariel Shope [facility] 04/04/2024, 4:52 PM

## 2024-04-05 DIAGNOSIS — A419 Sepsis, unspecified organism: Secondary | ICD-10-CM | POA: Diagnosis not present

## 2024-04-05 DIAGNOSIS — R6520 Severe sepsis without septic shock: Secondary | ICD-10-CM | POA: Diagnosis not present

## 2024-04-05 LAB — GLUCOSE, CAPILLARY
Glucose-Capillary: 134 mg/dL — ABNORMAL HIGH (ref 70–99)
Glucose-Capillary: 190 mg/dL — ABNORMAL HIGH (ref 70–99)
Glucose-Capillary: 74 mg/dL (ref 70–99)
Glucose-Capillary: 147 mg/dL — ABNORMAL HIGH (ref 70–99)

## 2024-04-05 NOTE — Plan of Care (Signed)
  Problem: Fluid Volume: Goal: Hemodynamic stability will improve Outcome: Progressing   Problem: Clinical Measurements: Goal: Diagnostic test results will improve Outcome: Progressing Goal: Signs and symptoms of infection will decrease Outcome: Progressing   Problem: Respiratory: Goal: Ability to maintain adequate ventilation will improve Outcome: Progressing   Problem: Education: Goal: Knowledge of risk factors and measures for prevention of condition will improve Outcome: Progressing   Problem: Coping: Goal: Psychosocial and spiritual needs will be supported Outcome: Progressing   Problem: Respiratory: Goal: Will maintain a patent airway Outcome: Progressing Goal: Complications related to the disease process, condition or treatment will be avoided or minimized Outcome: Progressing   Problem: Education: Goal: Ability to describe self-care measures that may prevent or decrease complications (Diabetes Survival Skills Education) will improve Outcome: Progressing Goal: Individualized Educational Video(s) Outcome: Progressing   Problem: Coping: Goal: Ability to adjust to condition or change in health will improve Outcome: Progressing   Problem: Fluid Volume: Goal: Ability to maintain a balanced intake and output will improve Outcome: Progressing   Problem: Health Behavior/Discharge Planning: Goal: Ability to identify and utilize available resources and services will improve Outcome: Progressing Goal: Ability to manage health-related needs will improve Outcome: Progressing   Problem: Metabolic: Goal: Ability to maintain appropriate glucose levels will improve Outcome: Progressing   Problem: Nutritional: Goal: Maintenance of adequate nutrition will improve Outcome: Progressing Goal: Progress toward achieving an optimal weight will improve Outcome: Progressing   Problem: Skin Integrity: Goal: Risk for impaired skin integrity will decrease Outcome: Progressing    Problem: Tissue Perfusion: Goal: Adequacy of tissue perfusion will improve Outcome: Progressing   Problem: Education: Goal: Knowledge of General Education information will improve Description: Including pain rating scale, medication(s)/side effects and non-pharmacologic comfort measures Outcome: Progressing   Problem: Health Behavior/Discharge Planning: Goal: Ability to manage health-related needs will improve Outcome: Progressing   Problem: Clinical Measurements: Goal: Ability to maintain clinical measurements within normal limits will improve Outcome: Progressing Goal: Will remain free from infection Outcome: Progressing Goal: Diagnostic test results will improve Outcome: Progressing Goal: Respiratory complications will improve Outcome: Progressing Goal: Cardiovascular complication will be avoided Outcome: Progressing   Problem: Activity: Goal: Risk for activity intolerance will decrease Outcome: Progressing   Problem: Nutrition: Goal: Adequate nutrition will be maintained Outcome: Progressing   Problem: Coping: Goal: Level of anxiety will decrease Outcome: Progressing   Problem: Elimination: Goal: Will not experience complications related to bowel motility Outcome: Progressing Goal: Will not experience complications related to urinary retention Outcome: Progressing   Problem: Pain Managment: Goal: General experience of comfort will improve and/or be controlled Outcome: Progressing   Problem: Safety: Goal: Ability to remain free from injury will improve Outcome: Progressing   Problem: Skin Integrity: Goal: Risk for impaired skin integrity will decrease Outcome: Progressing

## 2024-04-05 NOTE — Progress Notes (Signed)
 [facility] PROGRESS NOTE    Progress Note  Earl [name]  MVH:[id number] DOB: Mar 27, 1941 DOA: 03/04/2024 PCP: [name], MD     Brief Narrative:   Earl [name] is an 83 y.o. 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  Critical care was consulted for persistent hypotension.  Respiratory panel was negative.  General surgery was consulted for sacral wound debridement with bedside excision on 03/05/2024 and 03/07/2024.  He is now hemodynamically stable for discharge.  On f/15/2025 the patient's daughter is upset that wound care has now stated that dressing changes will only happen every 3 days. Nursing agrees. I have discussed this with wound care. They have adjusted their plan, but apparently the patient's daughter continues to have questions about the wound care. I suggest that the daughter speak to wound care directly to resolve this.   Assessment/Plan:  Severe sepsis secondary to chronic infected pressure ulcers stage III: Sepsis is resolved. With pain and hypotension on admission. CT scan of the abdomen pelvis shows sacral decubitus ulcer extending to the right gluteus muscle with no evidence of osteomyelitis. Seen by PCCM started empirically on antibiotics which he has completed his course on 03/21/2024. General surgery is on board he underwent bedside excisional debridement on 03/05/2024 and 03/07/2024. General surgery recommended no further debridement. ID was consulted to continue complete antibiotic treatment in  house. Patient is medically stable to be transferred to skilled nursing facility.  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  See below for further details. I have explained to the daughter that this severe decubitus ulcer will take months to years to heal with good nutrition and good care, due to his comorbidities and nutritional status may be more.  Continue care per wound care instructions. Currently on no overlay mattress. Turn every 2 hours out of bed to chair, try if possible to set him up in the chair daily. He has been eating about 20 to 50% of his meals. Ensure 3 times daily. Nutrition has been consulted.  Severe Protein Calorie Malnutrition Prealbumin of less than 5.  Nutrition consult appreciated. They have recommended: 1 packet of Juven bid to support wound healing. Magic Cup tid with meals. Mighty shake tid with meals Ensure enlive Po TID. Vitamin C  500 mg bid. MVI daily.  History of paroxysmal atrial fibrillation [facility]) Cardiology is consulted currently on amiodarone  and Eliquis . Patient has had intermittent bouts of A-fib but remains asymptomatic now rate controlled. Daughter does not want to take off the telemetry.  History of HFrEF/cardiac amyloid/nonischemic cardiomyopathy: Entresto  and metoprolol  held mission. Cardiology recommended to follow-up with them as an outpatient continue low-dose Lasix . Blood pressure seems to be stable off Entresto  and metoprolol .  Left lower lobe pneumonia: Completed course of antibiotics.  Insulin -dependent diabetes mellitus type 2: With an A1c of 8.4. Continue long-acting insulin  plus sliding scale.  GERD: Stable.  Anemia of chronic disease: Status post 1 unit packed red blood cells last hemoglobin was around 9.  Hypothyroidism: Synthroid .  Constipation: Started on MiraLAX  p.o. twice daily  Stage 4 sacral decubitus ulcer present on admission RN Pressure Injury Documentation: Pressure Injury 02/26/24  Coccyx Mid Unstageable - Full thickness tissue loss in which the base of the injury is covered by slough (yellow, tan, gray, green or brown) and/or eschar (tan, brown or black) in the wound bed. (Active)  02/26/24 0916  Location: Coccyx  Location Orientation: Mid  Staging: Unstageable - Full thickness tissue loss in which the base of the injury is covered by slough (yellow, tan, gray, green or brown) and/or eschar (tan, brown or black) in the wound bed.  Wound Description (Comments):   Present on Admission: No  Dressing Type Foam - Lift dressing to assess site every shift 04/05/24 1700     Pressure Injury 02/26/24 Toe (Comment  which one) Anterior;Right Deep Tissue Pressure Injury - Purple or maroon localized area of discolored intact skin or blood-filled blister due to damage of underlying soft tissue from pressure and/or shear. right gre (Active)  02/26/24 1215  Location: Toe (Comment  which one)  Location Orientation: Anterior;Right  Staging: Deep Tissue Pressure Injury - Purple or maroon localized area of discolored intact skin or blood-filled blister due to damage of underlying soft tissue from pressure and/or shear.  Wound Description (Comments): right great toe  Present on Admission:   Dressing Type None 04/02/24 0500     Pressure Injury 03/06/24 Hip Anterior;Left;Proximal Deep Tissue Pressure Injury - Purple or maroon localized area of discolored intact skin or blood-filled blister due to damage of underlying soft tissue from pressure and/or shear. Fluid filled blister w (Active)  03/06/24 1612  Location: Hip  Location Orientation: Anterior;Left;Proximal  Staging: Deep Tissue Pressure Injury - Purple or maroon localized area of discolored intact skin or blood-filled blister due to damage of underlying soft tissue from pressure and/or shear.  Wound Description (Comments): Fluid filled blister with purple discoloration  Present on Admission: No  Dressing Type Foam - Lift dressing to  assess site every shift 04/05/24 1125     Pressure Injury 03/06/24 Shoulder Left Deep Tissue Pressure Injury - Purple or maroon localized area of discolored intact skin or blood-filled blister due to damage of underlying soft tissue from pressure and/or shear. dark purple discoloration to should (Active)  03/06/24 1613  Location: Shoulder  Location Orientation: Left  Staging: Deep Tissue Pressure Injury - Purple or maroon localized area of discolored intact skin or blood-filled blister due to damage of underlying soft tissue from pressure and/or shear.  Wound Description (Comments): dark purple discoloration to shoulder  Present on Admission: No  Dressing Type Foam - Lift dressing to assess site every shift 04/05/24 1700   Dysphagia: Patient on a pured diet albumin  appears to be improving. Continue Ensure. Will check a prealbumin  DVT prophylaxis: lovenox  Family Communication:none Status is: Inpatient Remains inpatient appropriate because: Awaiting skilled nursing facility placement.   Code Status:     Code Status Orders  (From admission, onward)           Start     Ordered   03/04/24 2149  Do not attempt resuscitation (DNR)- Limited -Do Not Intubate (DNI)  Continuous       Question Answer Comment  If pulseless and not breathing No CPR or chest compressions.   In Pre-Arrest Conditions (Patient Is Breathing and Has A Pulse) Do not intubate. Provide all appropriate non-invasive medical interventions. Avoid ICU transfer unless indicated or required.   Consent: Discussion documented in EHR or advanced directives reviewed      03/04/24 2150           Code Status History  Date Active Date Inactive Code Status Order ID Comments User Context   02/25/2024 1652 02/29/2024 1738 Limited: Do not attempt resuscitation (DNR) -DNR-LIMITED -Do Not Intubate/DNI  [id number]  Annette Killings, NP Inpatient   02/15/2024 0814 02/25/2024 1652 Full Code [id number]  Hoyt Macleod, MD ED    11/14/2023 2326 11/16/2023 2302 Full Code [id number]  Jetty Mort, MD ED   10/07/2021 1136 10/07/2021 1942 Full Code [id number]  Darlis Eisenmenger, MD Inpatient   07/28/2017 1834 08/08/2017 1437 Full Code [id number]  Zelda Hickman, PA-C Inpatient   07/28/2017 1834 07/28/2017 1834 Full Code [id number]  Zelda Hickman, PA-C Inpatient   07/26/2017 0109 07/28/2017 1828 Full Code [id number]  Pati Bonine, MD Inpatient         IV Access:   Peripheral IV   Procedures and diagnostic studies:   No results found.   Medical Consultants:   None.   Subjective:    Izyk Krebbs he was sleeping this morning.  Objective:    Vitals:   04/05/24 0042 04/05/24 0419 04/05/24 0743 04/05/24 1314  BP: 124/61 112/69 125/75 116/66  Pulse: 87 78 80 80  Resp: 19 18 18 18   Temp: 97.7 F (36.5 C) 98.6 F (37 C) 98.2 F (36.8 C) (!) 97.5 F (36.4 C)  TempSrc:      SpO2: 100% 100% 99% 100%  Weight:      Height:       SpO2: 100 %   Intake/Output Summary (Last 24 hours) at 04/05/2024 1933 Last data filed at 04/05/2024 1700 Gross per 24 hour  Intake --  Output 1350 ml  Net -1350 ml   Filed Weights   03/04/24 1656 03/15/24 0500  Weight: 68.5 kg 75.3 kg    Exam: General exam: In no acute distress. Respiratory system: Good air movement and clear to auscultation. Cardiovascular system: S1 & S2 heard, RRR. No JVD. Gastrointestinal system: Abdomen is nondistended, soft and nontender.  Skin: No rashes, lesions or ulcers   Data Reviewed:    Labs: Basic Metabolic Panel: No results for input(s): "NA", "K", "CL", "CO2", "GLUCOSE", "BUN", "CREATININE", "CALCIUM ", "MG", "PHOS" in the last 168 hours.  GFR Estimated Creatinine Clearance: 75.8 mL/min (by C-G formula based on SCr of 0.67 mg/dL). Liver Function Tests: No results for input(s): "AST", "ALT", "ALKPHOS", "BILITOT", "PROT", "ALBUMIN " in the last 168 hours.  No results for input(s): "LIPASE", "AMYLASE" in the last 168  hours. No results for input(s): "AMMONIA" in the last 168 hours. Coagulation profile No results for input(s): "INR", "PROTIME" in the last 168 hours. COVID-19 Labs  No results for input(s): "DDIMER", "FERRITIN", "LDH", "CRP" in the last 72 hours.  Lab Results  Component Value Date   SARSCOV2NAA POSITIVE (A) 03/04/2024   SARSCOV2NAA POSITIVE (A) 02/14/2024   SARSCOV2NAA NEGATIVE 06/07/2019    CBC: Recent Labs  Lab 04/04/24 0647  WBC 6.9  NEUTROABS 4.0  HGB 9.5*  HCT 29.2*  MCV 86.4  PLT 423*    Cardiac Enzymes: No results for input(s): "CKTOTAL", "CKMB", "CKMBINDEX", "TROPONINI" in the last 168 hours. BNP (last 3 results) No results for input(s): "PROBNP" in the last 8760 hours. CBG: Recent Labs  Lab 04/04/24 1633 04/04/24 2004 04/05/24 0743 04/05/24 1308 04/05/24 1744  GLUCAP 133* 132* 134* 190* 74   D-Dimer: No results for input(s): "DDIMER" in the last 72 hours. Hgb A1c: No results for input(s): "HGBA1C" in the last 72 hours. Lipid Profile: No results for input(s): "CHOL", "HDL", "LDLCALC", "TRIG", "CHOLHDL", "LDLDIRECT"  in the last 72 hours. Thyroid  function studies: No results for input(s): "TSH", "T4TOTAL", "T3FREE", "THYROIDAB" in the last 72 hours.  Invalid input(s): "FREET3" Anemia work up: No results for input(s): "VITAMINB12", "FOLATE", "FERRITIN", "TIBC", "IRON", "RETICCTPCT" in the last 72 hours. Sepsis Labs: Recent Labs  Lab 04/04/24 0647  WBC 6.9    Microbiology No results found for this or any previous visit (from the past 240 hours).   Medications:    acetaminophen   500 mg Oral TID   amiodarone   200 mg Oral Daily   apixaban   5 mg Oral BID   ascorbic acid   500 mg Oral BID   atorvastatin   40 mg Oral Daily   Chlorhexidine  Gluconate Cloth  6 each Topical Daily   collagenase    Topical Daily   diclofenac  Sodium  4 g Topical QID   feeding supplement  237 mL Oral TID BM   ferrous sulfate   325 mg Oral QHS   furosemide   20 mg Oral  Daily   guaiFENesin   600 mg Oral BID   insulin  aspart  0-15 Units Subcutaneous TID WC   insulin  aspart  0-5 Units Subcutaneous QHS   insulin  aspart  4 Units Subcutaneous TID WC   insulin  glargine-yfgn  7 Units Subcutaneous Daily   levothyroxine   50 mcg Oral Q0600   liver oil-zinc  oxide   Topical BID   midodrine   5 mg Oral TID WC   multivitamin with minerals  1 tablet Oral Daily   nutrition supplement (JUVEN)  1 packet Oral BID BM   oxyCODONE   10 mg Oral Q12H   phosphorus  500 mg Oral TID   senna-docusate  1 tablet Oral QHS   sodium chloride  flush  3 mL Intravenous Q12H   Tafamidis   1 capsule Oral Daily   Continuous Infusions:    LOS: 32 days   Kelse Ploch  [facility]  04/05/2024, 7:33 PM

## 2024-04-05 NOTE — Progress Notes (Signed)
 Occupational Therapy Treatment Patient Details Name: Earl [name] MRN: [id number] DOB: [birth year] Today's Date: 04/05/2024   History of present illness 83 y.o. male admitted from [facility] rehab on 03/04/24 with SOB, sacral pain. Workup for acute metabolic encephalopathy, rhabdomyolysis, sepsis secondary to sacral wound infection, afib with RVR. S/p bedside sacral wound debridement 4/15 and 4/17. Of note, recent admit 3/26-4/08/2024 with fall, AMS, COVID; d/c to SNF rehab. Other PMH includes CVA (2021, residual R-side weakness), DM2, HTN, CHF, MGUS, afib on Eliquis .   OT comments  This 83 yo male seen to deliver foam tubing to build up his eating utensils and to assess his hand function AROM/strength--which is getting better and so his resistance foam blocks were upgraded for each hand. We will continue to follow.      If plan is discharge home, recommend the following:  A lot of help with walking and/or transfers;A lot of help with bathing/dressing/bathroom;Assistance with cooking/housework;Assist for transportation;Help with stairs or ramp for entrance;Two people to help with walking and/or transfers   Equipment Recommendations  Other (comment) (defer to next venue)       Precautions / Restrictions Precautions Precautions: Fall;Other (comment) Recall of Precautions/Restrictions: Intact Restrictions Weight Bearing Restrictions Per Provider Order: No              ADL either performed or assessed with clinical judgement   ADL     Eating/Feeding Details (indicate cue type and reason): There was a request for new build up foam for patient's eating utensils due to his falling apart. 3 red foam tubing pieces were delieverd to room and pt's RN was informed what they were for and the patient was happy to get them--stating "I needed those"                                        Extremity/Trunk Assessment Upper Extremity Assessment Upper Extremity Assessment:  Generalized weakness RUE Deficits / Details: Pt has full grasp in RUE now (he has been working with blue foam block--changed to green foam block today) LUE Deficits / Details: Pt continues with decreased composite finger flexion on left hand and he is aware (he pointed it out to me and even used his right hand to try and help his left fingers flex more)--he has been using yellow foam block (changed to blue foam block today)            Vision Baseline Vision/History: 1 Wears glasses Ability to See in Adequate Light: 0 Adequate Patient Visual Report: No change from baseline           Communication Communication Communication: Impaired Factors Affecting Communication:  (speech becoming more clear)   Cognition Arousal: Alert Behavior During Therapy: WFL for tasks assessed/performed Cognition: No apparent impairments             OT - Cognition Comments: pt slower processing but pleasant and follows directions well.                 Following commands: Impaired Following commands impaired: Follows one step commands with increased time      Cueing   Cueing Techniques: Verbal cues, Tactile cues             Pertinent Vitals/ Pain       Pain Assessment Pain Assessment: No/denies pain         Frequency  Min 2X/week  Progress Toward Goals  OT Goals(current goals can now be found in the care plan section)  Progress towards OT goals: Progressing toward goals  Acute Rehab OT Goals Patient Stated Goal: none stated OT Goal Formulation: With patient/family Time For Goal Achievement: 04/16/24 Potential to Achieve Goals: Fair         AM-PAC OT "6 Clicks" Daily Activity     Outcome Measure   Help from another person eating meals?: A Little (if properly set up) Help from another person taking care of personal grooming?: A Lot Help from another person toileting, which includes using toliet, bedpan, or urinal?: Total Help from another person bathing  (including washing, rinsing, drying)?: A Lot Help from another person to put on and taking off regular upper body clothing?: A Lot Help from another person to put on and taking off regular lower body clothing?: Total 6 Click Score: 11    End of Session    OT Visit Diagnosis: Other abnormalities of gait and mobility (R26.89);Muscle weakness (generalized) (M62.81);History of falling (Z91.81)   Activity Tolerance Patient tolerated treatment well   Patient Left in bed;with call bell/phone within reach   Nurse Communication  (what foam tubing was for)        Time: 1039-1050 OT Time Calculation (min): 11 min  Charges: OT General Charges $OT Visit: 1 Visit OT Treatments $Therapeutic Exercise: 8-22 mins  Merryl Abraham OT Acute Rehabilitation Services Office [phone]    Earl [name] 04/05/2024, 11:26 AM

## 2024-04-05 NOTE — TOC Progression Note (Signed)
 Transition of Care [facility]) - Progression Note    Patient Details  Name: Earl [name] MRN: [id number] Date of Birth: [birth year]  Transition of [facility] CM/SW Contact  Terrica Duecker A [place], LCSW Phone Number: 04/05/2024, 4:30 PM  Clinical Narrative:     CSW made attempt to speak with pt's daughter Para to find out if she had spoken with facility and was able to send information to facility as she had planned. She was not in room, CSW briefly spoke with pt, said she would be back later, was running some errands, etc.  CSW to follow up at another opportune time.    TOC will continue to follow.   Expected Discharge Plan: Skilled Nursing Facility Barriers to Discharge: Continued Medical Work up  Expected Discharge Plan and Services In-house Referral: Clinical Social Work     Living arrangements for the past 2 months:  (from [facility] short term rehab)                                       Social Determinants of Health (SDOH) Interventions SDOH Screenings   Food Insecurity: No Food Insecurity (03/05/2024)  Housing: Low Risk  (03/05/2024)  Transportation Needs: No Transportation Needs (03/05/2024)  Utilities: Not At Risk (03/05/2024)  Depression (PHQ2-9): Low Risk  (10/03/2023)  Financial Resource Strain: Patient Declined (01/16/2024)  Physical Activity: Unknown (01/16/2024)  Social Connections: Moderately Integrated (03/05/2024)  Stress: Stress Concern Present (01/16/2024)  Tobacco Use: Low Risk  (03/04/2024)    Readmission Risk Interventions    03/19/2024   10:24 AM  Readmission Risk Prevention Plan  Transportation Screening Complete  Medication Review (RN Care Manager) Complete  PCP or Specialist appointment within 3-5 days of discharge Complete  HRI or Home Care Consult Complete  SW Recovery Care/Counseling Consult Complete  Palliative Care Screening Complete  Skilled Nursing Facility Complete

## 2024-04-06 DIAGNOSIS — A419 Sepsis, unspecified organism: Secondary | ICD-10-CM | POA: Diagnosis not present

## 2024-04-06 DIAGNOSIS — R6520 Severe sepsis without septic shock: Secondary | ICD-10-CM | POA: Diagnosis not present

## 2024-04-06 LAB — GLUCOSE, CAPILLARY
Glucose-Capillary: 170 mg/dL — ABNORMAL HIGH (ref 70–99)
Glucose-Capillary: 210 mg/dL — ABNORMAL HIGH (ref 70–99)
Glucose-Capillary: 223 mg/dL — ABNORMAL HIGH (ref 70–99)
Glucose-Capillary: 158 mg/dL — ABNORMAL HIGH (ref 70–99)

## 2024-04-06 NOTE — Plan of Care (Signed)
  Problem: Fluid Volume: Goal: Hemodynamic stability will improve Outcome: Progressing   Problem: Clinical Measurements: Goal: Diagnostic test results will improve Outcome: Progressing Goal: Signs and symptoms of infection will decrease Outcome: Progressing   Problem: Respiratory: Goal: Ability to maintain adequate ventilation will improve Outcome: Progressing   Problem: Education: Goal: Knowledge of risk factors and measures for prevention of condition will improve Outcome: Progressing   Problem: Coping: Goal: Psychosocial and spiritual needs will be supported Outcome: Progressing   Problem: Respiratory: Goal: Will maintain a patent airway Outcome: Progressing Goal: Complications related to the disease process, condition or treatment will be avoided or minimized Outcome: Progressing   Problem: Education: Goal: Ability to describe self-care measures that may prevent or decrease complications (Diabetes Survival Skills Education) will improve Outcome: Progressing Goal: Individualized Educational Video(s) Outcome: Progressing   Problem: Coping: Goal: Ability to adjust to condition or change in health will improve Outcome: Progressing   Problem: Fluid Volume: Goal: Ability to maintain a balanced intake and output will improve Outcome: Progressing   Problem: Health Behavior/Discharge Planning: Goal: Ability to identify and utilize available resources and services will improve Outcome: Progressing Goal: Ability to manage health-related needs will improve Outcome: Progressing   Problem: Metabolic: Goal: Ability to maintain appropriate glucose levels will improve Outcome: Progressing   Problem: Nutritional: Goal: Maintenance of adequate nutrition will improve Outcome: Progressing Goal: Progress toward achieving an optimal weight will improve Outcome: Progressing   Problem: Skin Integrity: Goal: Risk for impaired skin integrity will decrease Outcome: Progressing    Problem: Tissue Perfusion: Goal: Adequacy of tissue perfusion will improve Outcome: Progressing   Problem: Education: Goal: Knowledge of General Education information will improve Description: Including pain rating scale, medication(s)/side effects and non-pharmacologic comfort measures Outcome: Progressing   Problem: Health Behavior/Discharge Planning: Goal: Ability to manage health-related needs will improve Outcome: Progressing   Problem: Clinical Measurements: Goal: Ability to maintain clinical measurements within normal limits will improve Outcome: Progressing Goal: Will remain free from infection Outcome: Progressing Goal: Diagnostic test results will improve Outcome: Progressing Goal: Respiratory complications will improve Outcome: Progressing Goal: Cardiovascular complication will be avoided Outcome: Progressing   Problem: Activity: Goal: Risk for activity intolerance will decrease Outcome: Progressing   Problem: Nutrition: Goal: Adequate nutrition will be maintained Outcome: Progressing   Problem: Coping: Goal: Level of anxiety will decrease Outcome: Progressing   Problem: Elimination: Goal: Will not experience complications related to bowel motility Outcome: Progressing Goal: Will not experience complications related to urinary retention Outcome: Progressing   Problem: Pain Managment: Goal: General experience of comfort will improve and/or be controlled Outcome: Progressing   Problem: Safety: Goal: Ability to remain free from injury will improve Outcome: Progressing   Problem: Skin Integrity: Goal: Risk for impaired skin integrity will decrease Outcome: Progressing

## 2024-04-06 NOTE — Progress Notes (Signed)
 [facility] PROGRESS NOTE    Progress Note  Earl [name]  VWU:[id number] DOB: 1941-11-11 DOA: 03/04/2024 PCP: [name], MD     Brief Narrative:   Earl [name] is an 83 y.o. 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  Critical care was consulted for persistent hypotension.  Respiratory panel was negative.  General surgery was consulted for sacral wound debridement with bedside excision on 03/05/2024 and 03/07/2024.  He is now hemodynamically stable for discharge.  On f/15/2025 the patient's daughter is upset that wound care has now stated that dressing changes will only happen every 3 days. Nursing agrees. I have discussed this with wound care. They have adjusted their plan, but apparently the patient's daughter continues to have questions about the wound care. I suggest that the daughter speak to wound care directly to resolve this.   On 04/06/2024 the patient's daughter , para and the patient's son are at bedside. Para continues to have concerns about wound care and the consistency of wound care orders in particular. I feel it would be helpful for her to attend a meeting with wound care where a treatment plan for the patient's wounds for a defined period of time would be spelled out and then followed consistently for that period of time. She would also like to meet with nutrition.  Assessment/Plan:  Severe sepsis secondary to chronic infected pressure ulcers stage III: Sepsis is resolved. With pain and hypotension on  admission. CT scan of the abdomen pelvis shows sacral decubitus ulcer extending to the right gluteus muscle with no evidence of osteomyelitis. Seen by PCCM started empirically on antibiotics which he has completed his course on 03/21/2024. General surgery is on board he underwent bedside excisional debridement on 03/05/2024 and 03/07/2024. General surgery recommended no further debridement. ID was consulted to continue complete antibiotic treatment in house. Patient is medically stable to be transferred to skilled nursing facility.  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  See below for further details. I have explained to the daughter that this severe decubitus ulcer will take months to years to heal with good nutrition and good care, due to his comorbidities and nutritional status may be more.  Continue care per wound care instructions. Currently on no overlay mattress. Turn every 2 hours out of bed to chair, try if possible to set him up in the chair daily. He has been eating about 20 to 50% of his meals. Ensure 3 times daily. Nutrition has been consulted.  Severe Protein Calorie Malnutrition Prealbumin of less than 5.  Nutrition consult appreciated. They have recommended: 1 packet of Juven bid to support wound healing. Magic Cup tid with meals. Mighty shake tid with meals Ensure enlive Po TID. Vitamin C  500 mg bid. MVI daily.  History of paroxysmal atrial fibrillation [facility]) Cardiology is consulted currently on amiodarone  and Eliquis . Patient has had intermittent bouts of A-fib but remains asymptomatic now rate controlled. Daughter does not want to take off the telemetry.  History of HFrEF/cardiac amyloid/nonischemic cardiomyopathy: Entresto  and metoprolol  held mission. Cardiology recommended to follow-up with them as an outpatient continue low-dose Lasix . Blood pressure seems  to be stable off Entresto  and metoprolol .  Left lower lobe pneumonia: Completed  course of antibiotics.  Insulin -dependent diabetes mellitus type 2: With an A1c of 8.4. Continue long-acting insulin  plus sliding scale.  GERD: Stable.  Anemia of chronic disease: Status post 1 unit packed red blood cells last hemoglobin was around 9.  Hypothyroidism: Synthroid .  Constipation: Started on MiraLAX  p.o. twice daily  Stage 4 sacral decubitus ulcer present on admission RN Pressure Injury Documentation: Pressure Injury 02/26/24 Coccyx Mid Unstageable - Full thickness tissue loss in which the base of the injury is covered by slough (yellow, tan, gray, green or brown) and/or eschar (tan, brown or black) in the wound bed. (Active)  02/26/24 0916  Location: Coccyx  Location Orientation: Mid  Staging: Unstageable - Full thickness tissue loss in which the base of the injury is covered by slough (yellow, tan, gray, green or brown) and/or eschar (tan, brown or black) in the wound bed.  Wound Description (Comments):   Present on Admission: No  Dressing Type Silver hydrofiber;Foam - Lift dressing to assess site every shift 04/06/24 0100     Pressure Injury 02/26/24 Toe (Comment  which one) Anterior;Right Deep Tissue Pressure Injury - Purple or maroon localized area of discolored intact skin or blood-filled blister due to damage of underlying soft tissue from pressure and/or shear. right gre (Active)  02/26/24 1215  Location: Toe (Comment  which one)  Location Orientation: Anterior;Right  Staging: Deep Tissue Pressure Injury - Purple or maroon localized area of discolored intact skin or blood-filled blister due to damage of underlying soft tissue from pressure and/or shear.  Wound Description (Comments): right great toe  Present on Admission:   Dressing Type None 04/02/24 0500     Pressure Injury 03/06/24 Hip Anterior;Left;Proximal Deep Tissue Pressure Injury - Purple or maroon localized area of discolored intact skin or blood-filled blister due to damage of underlying soft  tissue from pressure and/or shear. Fluid filled blister w (Active)  03/06/24 1612  Location: Hip  Location Orientation: Anterior;Left;Proximal  Staging: Deep Tissue Pressure Injury - Purple or maroon localized area of discolored intact skin or blood-filled blister due to damage of underlying soft tissue from pressure and/or shear.  Wound Description (Comments): Fluid filled blister with purple discoloration  Present on Admission: No  Dressing Type Impregnated gauze (petrolatum);Foam - Lift dressing to assess site every shift 04/06/24 0500     Pressure Injury 03/06/24 Shoulder Left Deep Tissue Pressure Injury - Purple or maroon localized area of discolored intact skin or blood-filled blister due to damage of underlying soft tissue from pressure and/or shear. dark purple discoloration to should (Active)  03/06/24 1613  Location: Shoulder  Location Orientation: Left  Staging: Deep Tissue Pressure Injury - Purple or maroon localized area of discolored intact skin or blood-filled blister due to damage of underlying soft tissue from pressure and/or shear.  Wound Description (Comments): dark purple discoloration to shoulder  Present on Admission: No  Dressing Type Foam - Lift dressing to assess site every shift 04/05/24 1700   Dysphagia: Patient on a pured diet albumin  appears to be improving. Continue Ensure. Will check a prealbumin  DVT prophylaxis: lovenox  Family Communication:none Status is: Inpatient Remains inpatient appropriate because: Awaiting skilled nursing facility placement.   Code Status:     Code Status Orders  (From admission, onward)           Start     Ordered   03/04/24 2149  Do not attempt resuscitation (DNR)- Limited -Do Not  Intubate (DNI)  Continuous       Question Answer Comment  If pulseless and not breathing No CPR or chest compressions.   In Pre-Arrest Conditions (Patient Is Breathing and Has A Pulse) Do not intubate. Provide all appropriate non-invasive  medical interventions. Avoid ICU transfer unless indicated or required.   Consent: Discussion documented in EHR or advanced directives reviewed      03/04/24 2150           Code Status History     Date Active Date Inactive Code Status Order ID Comments User Context   02/25/2024 1652 02/29/2024 1738 Limited: Do not attempt resuscitation (DNR) -DNR-LIMITED -Do Not Intubate/DNI  [id number]  Annette Killings, NP Inpatient   02/15/2024 0814 02/25/2024 1652 Full Code [id number]  Hoyt Macleod, MD ED   11/14/2023 2326 11/16/2023 2302 Full Code [id number]  Jetty Mort, MD ED   10/07/2021 1136 10/07/2021 1942 Full Code [id number]  Darlis Eisenmenger, MD Inpatient   07/28/2017 1834 08/08/2017 1437 Full Code [id number]  Zelda Hickman, PA-C Inpatient   07/28/2017 1834 07/28/2017 1834 Full Code [id number]  Zelda Hickman, PA-C Inpatient   07/26/2017 0109 07/28/2017 1828 Full Code [id number]  Pati Bonine, MD Inpatient         IV Access:   Peripheral IV   Procedures and diagnostic studies:   No results found.   Medical Consultants:   None.   Subjective:    Earl [name] he was sleeping this morning.  Objective:    Vitals:   04/05/24 2339 04/06/24 0426 04/06/24 0835 04/06/24 1138  BP: 111/66 116/82 117/65 126/71  Pulse: 73 78 80 83  Resp: 17 17 17 18   Temp: 97.8 F (36.6 C) 98 F (36.7 C) 98.8 F (37.1 C) 98.7 F (37.1 C)  TempSrc: Oral Oral    SpO2: 100% 100% 100% 99%  Weight:      Height:       SpO2: 99 %   Intake/Output Summary (Last 24 hours) at 04/06/2024 1432 Last data filed at 04/06/2024 1150 Gross per 24 hour  Intake 236 ml  Output 1810 ml  Net -1574 ml   Filed Weights   03/04/24 1656 03/15/24 0500  Weight: 68.5 kg 75.3 kg    Exam: General exam: In no acute distress. Respiratory system: Good air movement and clear to auscultation. Cardiovascular system: S1 & S2 heard, RRR. No JVD. Gastrointestinal system: Abdomen is nondistended, soft and nontender.   Skin: No rashes, lesions or ulcers   Data Reviewed:    Labs: Basic Metabolic Panel: No results for input(s): "NA", "K", "CL", "CO2", "GLUCOSE", "BUN", "CREATININE", "CALCIUM ", "MG", "PHOS" in the last 168 hours.  GFR Estimated Creatinine Clearance: 75.8 mL/min (by C-G formula based on SCr of 0.67 mg/dL). Liver Function Tests: No results for input(s): "AST", "ALT", "ALKPHOS", "BILITOT", "PROT", "ALBUMIN " in the last 168 hours.  No results for input(s): "LIPASE", "AMYLASE" in the last 168 hours. No results for input(s): "AMMONIA" in the last 168 hours. Coagulation profile No results for input(s): "INR", "PROTIME" in the last 168 hours. COVID-19 Labs  No results for input(s): "DDIMER", "FERRITIN", "LDH", "CRP" in the last 72 hours.  Lab Results  Component Value Date   SARSCOV2NAA POSITIVE (A) 03/04/2024   SARSCOV2NAA POSITIVE (A) 02/14/2024   SARSCOV2NAA NEGATIVE 06/07/2019    CBC: Recent Labs  Lab 04/04/24 0647  WBC 6.9  NEUTROABS 4.0  HGB 9.5*  HCT 29.2*  MCV 86.4  PLT 423*    Cardiac  Enzymes: No results for input(s): "CKTOTAL", "CKMB", "CKMBINDEX", "TROPONINI" in the last 168 hours. BNP (last 3 results) No results for input(s): "PROBNP" in the last 8760 hours. CBG: Recent Labs  Lab 04/05/24 1308 04/05/24 1744 04/05/24 2058 04/06/24 0826 04/06/24 1136  GLUCAP 190* 74 147* 170* 210*   D-Dimer: No results for input(s): "DDIMER" in the last 72 hours. Hgb A1c: No results for input(s): "HGBA1C" in the last 72 hours. Lipid Profile: No results for input(s): "CHOL", "HDL", "LDLCALC", "TRIG", "CHOLHDL", "LDLDIRECT" in the last 72 hours. Thyroid  function studies: No results for input(s): "TSH", "T4TOTAL", "T3FREE", "THYROIDAB" in the last 72 hours.  Invalid input(s): "FREET3" Anemia work up: No results for input(s): "VITAMINB12", "FOLATE", "FERRITIN", "TIBC", "IRON", "RETICCTPCT" in the last 72 hours. Sepsis Labs: Recent Labs  Lab 04/04/24 0647  WBC 6.9     Microbiology No results found for this or any previous visit (from the past 240 hours).   Medications:    acetaminophen   500 mg Oral TID   amiodarone   200 mg Oral Daily   apixaban   5 mg Oral BID   atorvastatin   40 mg Oral Daily   Chlorhexidine  Gluconate Cloth  6 each Topical Daily   collagenase    Topical Daily   diclofenac  Sodium  4 g Topical QID   feeding supplement  237 mL Oral TID BM   ferrous sulfate   325 mg Oral QHS   furosemide   20 mg Oral Daily   guaiFENesin   600 mg Oral BID   insulin  aspart  0-15 Units Subcutaneous TID WC   insulin  aspart  0-5 Units Subcutaneous QHS   insulin  aspart  4 Units Subcutaneous TID WC   insulin  glargine-yfgn  7 Units Subcutaneous Daily   levothyroxine   50 mcg Oral Q0600   liver oil-zinc  oxide   Topical BID   midodrine   5 mg Oral TID WC   multivitamin with minerals  1 tablet Oral Daily   nutrition supplement (JUVEN)  1 packet Oral BID BM   oxyCODONE   10 mg Oral Q12H   phosphorus  500 mg Oral TID   senna-docusate  1 tablet Oral QHS   sodium chloride  flush  3 mL Intravenous Q12H   Tafamidis   1 capsule Oral Daily   Continuous Infusions:    LOS: 33 days   Earl [name]  [facility]  04/06/2024, 2:32 PM

## 2024-04-06 NOTE — Progress Notes (Signed)
 Pt's sacral wound was changed at 5/17 @ 0100 and daughter Para was at the bedside. Para requested that wound be changed using Aquacel instead of xeroform at this time. This RN discussed with Para the sacral wound care order and the updates that Marady, WOC nurse made and Para verbalized understanding and some concerns and at this time requested the sacral wound be performed with Aquacel.

## 2024-04-07 DIAGNOSIS — A419 Sepsis, unspecified organism: Secondary | ICD-10-CM | POA: Diagnosis not present

## 2024-04-07 DIAGNOSIS — R6520 Severe sepsis without septic shock: Secondary | ICD-10-CM | POA: Diagnosis not present

## 2024-04-07 LAB — CBC WITH DIFFERENTIAL/PLATELET
Abs Immature Granulocytes: 0.03 10*3/uL (ref 0.00–0.07)
Basophils Absolute: 0.1 10*3/uL (ref 0.0–0.1)
Basophils Relative: 1 %
Eosinophils Absolute: 0.2 10*3/uL (ref 0.0–0.5)
Eosinophils Relative: 3 %
HCT: 29.5 % — ABNORMAL LOW (ref 39.0–52.0)
Hemoglobin: 9.5 g/dL — ABNORMAL LOW (ref 13.0–17.0)
Immature Granulocytes: 1 %
Lymphocytes Relative: 23 %
Lymphs Abs: 1.5 10*3/uL (ref 0.7–4.0)
MCH: 28.4 pg (ref 26.0–34.0)
MCHC: 32.2 g/dL (ref 30.0–36.0)
MCV: 88.3 fL (ref 80.0–100.0)
Monocytes Absolute: 0.7 10*3/uL (ref 0.1–1.0)
Monocytes Relative: 10 %
Neutro Abs: 4.1 10*3/uL (ref 1.7–7.7)
Neutrophils Relative %: 62 %
Platelets: 373 10*3/uL (ref 150–400)
RBC: 3.34 MIL/uL — ABNORMAL LOW (ref 4.22–5.81)
RDW: 16.4 % — ABNORMAL HIGH (ref 11.5–15.5)
WBC: 6.6 10*3/uL (ref 4.0–10.5)
nRBC: 0 % (ref 0.0–0.2)

## 2024-04-07 LAB — GLUCOSE, CAPILLARY
Glucose-Capillary: 114 mg/dL — ABNORMAL HIGH (ref 70–99)
Glucose-Capillary: 184 mg/dL — ABNORMAL HIGH (ref 70–99)
Glucose-Capillary: 140 mg/dL — ABNORMAL HIGH (ref 70–99)
Glucose-Capillary: 95 mg/dL (ref 70–99)
Glucose-Capillary: 148 mg/dL — ABNORMAL HIGH (ref 70–99)

## 2024-04-07 LAB — BASIC METABOLIC PANEL WITH GFR
Anion gap: 9 (ref 5–15)
BUN: 25 mg/dL — ABNORMAL HIGH (ref 8–23)
CO2: 24 mmol/L (ref 22–32)
Calcium: 8.1 mg/dL — ABNORMAL LOW (ref 8.9–10.3)
Chloride: 105 mmol/L (ref 98–111)
Creatinine, Ser: 0.89 mg/dL (ref 0.61–1.24)
GFR, Estimated: >60 mL/min (ref >60)
Glucose, Bld: 206 mg/dL — ABNORMAL HIGH (ref 70–99)
Potassium: 4.2 mmol/L (ref 3.5–5.1)
Sodium: 138 mmol/L (ref 135–145)

## 2024-04-07 MED ORDER — ORAL CARE MOUTH RINSE: 15.0000 mL | OROMUCOSAL | Status: DC | PRN

## 2024-04-07 MED ORDER — POLYETHYLENE GLYCOL 3350 17 G PO PACK
17.0000 g | PACK | Freq: Two times a day (BID) | ORAL | Status: DC
  Administered 2024-04-07 – 2024-07-17 (×115): 17 g via ORAL
  Filled 2024-04-07 (×156): qty 1

## 2024-04-07 MED ORDER — ORAL CARE MOUTH RINSE
15.0000 mL | OROMUCOSAL | Status: DC
  Administered 2024-04-07 – 2024-07-07 (×326): 15 mL via OROMUCOSAL

## 2024-04-07 NOTE — Progress Notes (Signed)
 [facility] PROGRESS NOTE    Progress Note  Earl [name]  ZOX:[id number] DOB: Jul 11, 1941 DOA: 03/04/2024 PCP: [name], MD     Brief Narrative:   Earl [name] is an 83 y.o. male past medical history of paroxysmal atrial fibrillation on Eliquis , insulin -dependent diabetes mellitus type 2, essential hypertension, CVA with right residual weakness, MGUS, BPH, HFr EF of 40%, discharged, recently from [facility] for acute metabolic encephalopathy rhabdomyolysis and orthostatic, chronic pancytopenia presents to the ED from skilled nursing facility for shortness of breath sacral pain, in the ED was found to be in RVR, tachypneic with saturations greater than 90%.  Critical care was consulted for persistent hypotension.  Respiratory panel was negative.  General surgery was consulted for sacral wound debridement with bedside excision on 03/05/2024 and 03/07/2024.  He is now hemodynamically stable for discharge.  Assessment/Plan:   Severe sepsis secondary to chronic infected pressure ulcers stage III: Sepsis is resolved. With pain and hypotension on admission. CT scan of the abdomen pelvis shows sacral decubitus ulcer extending to the right gluteus muscle with no evidence of osteomyelitis. Seen by PCCM started empirically on antibiotics which he has completed his course on 03/21/2024. Surgery now signed off - he underwent bedside excisional debridement on 03/05/2024 and 03/07/2024. ID signed off 4/22, he's now completed antibiotics.  "Mainstay will be wound care and nutritional support" Patient is medically stable to be transferred to skilled nursing facility.  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 Turn every 2 hours out of bed to chair, try if possible to set him up in the chair daily. He has been eating about 20 to 50% of his meals. Ensure 3 times daily. Nutrition has been consulted.  History of paroxysmal atrial fibrillation (HCC) amiodarone  and Eliquis  Cards has signed off  Remains on telemetry due to the his daughter's preference  History of HFrEF/cardiac amyloid/nonischemic cardiomyopathy: Entresto  and metoprolol  held on admission. Cardiology recommended to follow-up Blood pressure seems to be stable off Entresto  and metoprolol . Now on lasix , midodrine  for hypotension (holding parameters) On tafamadis for amyloid  Last note from cards was 5/2.  Appears they signed off on 4/21 [facility]).  Insulin -dependent diabetes mellitus type 2: With an A1c of 8.4. Continue long-acting insulin  plus sliding scale. BG's appropriate  GERD: Stable.  Anemia of chronic disease: S/p 1 unit pRBC 4/30  Hypothyroidism: Synthroid .  Constipation: Started on MiraLAX  p.o. 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  Nutrition consult appreciated. They have recommended: 1 packet of Juven bid to support wound healing. Magic Cup tid with meals. Mighty shake tid with meals Ensure enlive Po TID. Vitamin C  500 mg bid. MVI daily. Patient on Johnathin Vanderschaaf pured diet albumin  appears to be improving.  Weakness  Debility  MRI 3/27 without acute infarct Lower extremity weakness>upper (? Related to chronic bed bound status/LE wound/pain?)  Continue PT/OT   Left lower lobe pneumonia: Completed course of antibiotics. Resolved  DVT prophylaxis: lovenox  Family Communication: discussed care with daughter at bedside Status  is:  Inpatient Remains inpatient appropriate because: Awaiting skilled nursing facility placement. Code Status:  DNR  IV Access:   Peripheral IV   Procedures and diagnostic studies:   No results found.   Medical Consultants:   Palliative care Infectious disease Cardiology Surgery PCCM   Subjective:    Earl [name] c/o pain to bottom today Discussed with his daughter, extended conversation regarding plan for wound care - plan to discuss with WOC team tmrw  Objective:    Vitals:   04/06/24 2335 04/07/24 0452 04/07/24 0816 04/07/24 1313  BP: 113/72 128/78 125/65 105/75  Pulse: 80 88 84 77  Resp: 18 18 17 17   Temp: 98.5 F (36.9 C) 98.1 F (36.7 C) (!) 97.4 F (36.3 C) (!) 97.5 F (36.4 C)  TempSrc: Oral Oral    SpO2: 100% 100% 98% 100%  Weight:      Height:       SpO2: 100 %   Intake/Output Summary (Last 24 hours) at 04/07/2024 1604 Last data filed at 04/07/2024 1323 Gross per 24 hour  Intake 1850 ml  Output 2200 ml  Net -350 ml   Filed Weights   03/04/24 1656 03/15/24 0500  Weight: 68.5 kg 75.3 kg    Exam: General: No acute distress. Cardiovascular: RRR Lungs: unlabored Abdomen: Soft, nontender, nondistended  Neurological: Alert and oriented 3. Moves all extremities 4. Cranial nerves II through XII grossly intact. Skin: skin exam deferred today Extremities: No clubbing or cyanosis. No edema.  Data Reviewed:    Labs: Basic Metabolic Panel: Recent Labs  Lab 04/07/24 1015  NA 138  K 4.2  CL 105  CO2 24  GLUCOSE 206*  BUN 25*  CREATININE 0.89  CALCIUM  8.1*    GFR Estimated Creatinine Clearance: 68.2 mL/min (by C-G formula based on SCr of 0.89 mg/dL). Liver Function Tests: No results for input(s): "AST", "ALT", "ALKPHOS", "BILITOT", "PROT", "ALBUMIN " in the last 168 hours.  No results for input(s): "LIPASE", "AMYLASE" in the last 168 hours. No results for input(s): "AMMONIA" in the last 168 hours. Coagulation profile No  results for input(s): "INR", "PROTIME" in the last 168 hours. COVID-19 Labs  No results for input(s): "DDIMER", "FERRITIN", "LDH", "CRP" in the last 72 hours.  Lab Results  Component Value Date   SARSCOV2NAA POSITIVE (Lenaya Pietsch) 03/04/2024   SARSCOV2NAA POSITIVE (Wilborn Membreno) 02/14/2024   SARSCOV2NAA NEGATIVE 06/07/2019    CBC: Recent Labs  Lab 04/04/24 0647 04/07/24 1015  WBC 6.9 6.6  NEUTROABS 4.0 4.1  HGB 9.5* 9.5*  HCT 29.2* 29.5*  MCV 86.4 88.3  PLT 423* 373    Cardiac Enzymes: No results for input(s): "CKTOTAL", "CKMB", "CKMBINDEX", "TROPONINI" in the last 168 hours. BNP (last 3 results) No results for input(s): "PROBNP" in the last 8760 hours. CBG: Recent Labs  Lab 04/06/24 1902 04/06/24 2009 04/07/24 0818 04/07/24 1020 04/07/24 1208  GLUCAP 223* 158* 114* 184* 140*   D-Dimer: No results for input(s): "DDIMER" in the last 72 hours. Hgb A1c: No results for input(s): "HGBA1C" in the last 72 hours. Lipid Profile: No results for input(s): "CHOL", "HDL", "LDLCALC", "TRIG", "CHOLHDL", "LDLDIRECT" in the last 72 hours. Thyroid  function studies: No results for input(s): "TSH", "T4TOTAL", "T3FREE", "THYROIDAB" in the last 72 hours.  Invalid input(s): "FREET3" Anemia work up: No results for input(s): "VITAMINB12", "FOLATE", "FERRITIN", "TIBC", "IRON", "RETICCTPCT" in the last 72 hours. Sepsis Labs: Recent Labs  Lab 04/04/24 0647 04/07/24 1015  WBC 6.9 6.6    Microbiology No results found for this  or any previous visit (from the past 240 hours).   Medications:    acetaminophen   500 mg Oral TID   amiodarone   200 mg Oral Daily   apixaban   5 mg Oral BID   atorvastatin   40 mg Oral Daily   Chlorhexidine  Gluconate Cloth  6 each Topical Daily   collagenase    Topical Daily   diclofenac  Sodium  4 g Topical QID   feeding supplement  237 mL Oral TID BM   ferrous sulfate   325 mg Oral QHS   furosemide   20 mg Oral Daily   guaiFENesin   600 mg Oral BID   insulin  aspart  0-15  Units Subcutaneous TID WC   insulin  aspart  0-5 Units Subcutaneous QHS   insulin  aspart  4 Units Subcutaneous TID WC   insulin  glargine-yfgn  7 Units Subcutaneous Daily   levothyroxine   50 mcg Oral Q0600   liver oil-zinc  oxide   Topical BID   midodrine   5 mg Oral TID WC   multivitamin with minerals  1 tablet Oral Daily   nutrition supplement (JUVEN)  1 packet Oral BID BM   oxyCODONE   10 mg Oral Q12H   phosphorus  500 mg Oral TID   polyethylene glycol  17 g Oral BID   senna-docusate  1 tablet Oral QHS   sodium chloride  flush  3 mL Intravenous Q12H   Tafamidis   1 capsule Oral Daily   Continuous Infusions:    LOS: 34 days   Donnetta Gains  [facility]  04/07/2024, 4:04 PM

## 2024-04-07 NOTE — Plan of Care (Signed)
  Daily Progress Note   Patient Name: Earl [name]       Date: 04/07/2024 DOB: 03/05/1941  Age: 83 y.o. MRN#: [id number] Attending Physician: Etter Hermann., * Primary Care Physician: [name], MD Admit Date: 03/04/2024 Length of Stay: 34 days  Discussed care with primary hospitalist today. Patient last seen by this palliative provider on 5/5. At that time, patient's goals for care remained focused on treating the treatable and improving to the best of patient's ability. These goals for care have remained the same during patient's hospitalization. As goals for medical care are currently determined, palliative medicine team will sign off. Please reach out if our team can be of further assistance in the future. Thank you for involving our team in patient's care.    Debroah Shuttleworth, PA-C Palliative Care Provider PMT # [phone]  No Charge Note

## 2024-04-08 ENCOUNTER — Encounter: Admitting: Family

## 2024-04-08 DIAGNOSIS — A419 Sepsis, unspecified organism: Secondary | ICD-10-CM | POA: Diagnosis not present

## 2024-04-08 DIAGNOSIS — R6520 Severe sepsis without septic shock: Secondary | ICD-10-CM | POA: Diagnosis not present

## 2024-04-08 LAB — GLUCOSE, CAPILLARY
Glucose-Capillary: 154 mg/dL — ABNORMAL HIGH (ref 70–99)
Glucose-Capillary: 204 mg/dL — ABNORMAL HIGH (ref 70–99)
Glucose-Capillary: 94 mg/dL (ref 70–99)
Glucose-Capillary: 136 mg/dL — ABNORMAL HIGH (ref 70–99)

## 2024-04-08 NOTE — Progress Notes (Signed)
 Nutrition Follow-up  DOCUMENTATION CODES:   Severe malnutrition in context of chronic illness  INTERVENTION:  - Continue 1 packet Juven BID to support wound healing, each packet provides 95 calories, 2.5 grams of protein (collagen), and 9.8 grams of carbohydrate   - Continue Magic Cup TID with meals, each supplement provides 290 kcal and 9 grams of protein   - Continue Mighty Shake TID with meals, each supplement provides 330 kcals and 9 grams of protein   - Continue Ensure Enlive po TID, each supplement provides 350 kcal and 20 grams of protein.    - Continue vitamin C  500 mg BID x 30 days to support wound healing  - Continue MVI with minerals daily  NUTRITION DIAGNOSIS:   Severe Malnutrition related to chronic illness as evidenced by energy intake < or equal to 75% for > or equal to 1 month, severe muscle depletion.  Ongoing, being addressed via oral nutrition supplements  GOAL:   Patient will meet greater than or equal to 90% of their needs  Progressing  MONITOR:   PO intake, Supplement acceptance, Weight trends, Skin  REASON FOR ASSESSMENT:   Consult Assessment of nutrition requirement/status  ASSESSMENT:   83 year old male who presented to the ED on 03/04/24 from [facility] due to sacral pain. PMH of T2DM, HTN, HLD, CHF, atrial fibrillation, stroke with right-sided weakness, GERD, BPH, hypothyroidism, MGUS, transthyretin associated amyloid cardiomyopathy, chronic pancytopenia, recent hospital admission for COVID-19. Pt admitted with sepsis secondary to sacral pressure injury infection.  4/14 - Admitted  4/15 - s/p bedside wound debridement, initiation of hydrotherapy   Met with daughter at bedside. Daughter inquiring about protein adjustments that may need to happen. RD expressed that pt is on appropriate supplements for current situation and helping get adequate nutrition to assist in wound healing and maintain strength with therapy. Daughter expressed  understanding and in agreement. Shares that breakfast is the best meal and when he typically clears the plate. Asked for eggs to be on all breakfast trays, RD to order.   Meal Intake 5/10: 50% lunch 5/13: 100% lunch 5/14: 30% dinner 5/19: 100% breakfast, 100% lunch  Nutrition Related Medications: Ferrous Sulfate , Lasix , NovoLog  0-15 units TID + 0-5 units daily + 4 units TID, Semglee  7 units , MVI, Juven, Phosphorus TID, Miralax , Senokot-S Labs: reviewed from 5/18  CBG: 95-204 mg/dL x 24 hrs   UOP: 130 mL x 24 hrs  Diet Order:   Diet Order             DIET - DYS 1 Room service appropriate? Yes; Fluid consistency: Thin  Diet effective now                   EDUCATION NEEDS:   No education needs have been identified at this time  Skin:  Skin Assessment: Skin Integrity Issues: (per most recent WOC note 5/15) Skin Integrity Issues: DTI: L shoulder 2 cm x 3 cm, great toe 0.2 cm scabbed  Stage II: L hip 5 cm x 7 cm x 0.2 cm  Unstageable: sacrum 8 cm x 6 cm x 2 cm  Other: wound to R heel 2 cm x 1.2 cm , skin tear to L elbow  Last BM:  5/19 - Type 4  Height:  Ht Readings from Last 1 Encounters:  03/04/24 5\' 11"  (1.803 m)   Weight:  Wt Readings from Last 1 Encounters:  03/15/24 75.3 kg   BMI:  Body mass index is 23.15 kg/m.  Estimated Nutritional  Needs:  Kcal:  2000-2200 Protein:  95-100 grams Fluid:  >2.0 L   Doneta Furbish RD, LDN Clinical Dietitian

## 2024-04-08 NOTE — Progress Notes (Addendum)
 [facility] PROGRESS NOTE    Progress Note  Jaran Ebbert  ZOX:[id number] DOB: June 07, 1941 DOA: 03/04/2024 PCP: [name], MD     Brief Narrative:   Earl [name] is an 83 y.o. 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  Critical care was consulted for persistent hypotension.  Respiratory panel was negative.  General surgery was consulted for sacral wound debridement with bedside excision on 03/05/2024 and 03/07/2024.  He is now hemodynamically stable for discharge.  Assessment/Plan:   Severe sepsis secondary to chronic infected pressure ulcers stage III: Sepsis is resolved. With pain and hypotension on admission. CT scan of the abdomen pelvis shows sacral decubitus ulcer extending to the right gluteus muscle with no evidence of osteomyelitis. Seen by PCCM started empirically on antibiotics which he has completed his course on 03/21/2024. Surgery now signed off - he underwent bedside excisional debridement on 03/05/2024 and 03/07/2024. ID signed off 4/22, he's now completed antibiotics.  "Mainstay will be wound care and nutritional support" Patient is medically stable to be transferred to skilled nursing facility.  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 Turn every 2 hours out of bed to chair, try if possible to set him up in the chair daily. Recently eating 100% of meals Ensure 3 times daily. Nutrition has been consulted.  History of paroxysmal atrial fibrillation (HCC) amiodarone  and Eliquis  Cards has signed off  Remains on telemetry due to the his daughter's preference  History of HFrEF/cardiac amyloid/nonischemic cardiomyopathy: Entresto  and metoprolol  held on admission. Cardiology recommended to follow-up Blood pressure seems to be stable off Entresto  and metoprolol . Now on lasix , midodrine  for hypotension (holding parameters) On tafamadis for amyloid  Last note from cards was 5/2.  Appears they signed off on 4/21 [facility]).  Insulin -dependent diabetes mellitus type 2: With an A1c of 8.4. Continue long-acting insulin  plus sliding scale. BG's appropriate  GERD: Stable.  Anemia of chronic disease: S/p 1 unit pRBC 4/30  Hypothyroidism: Synthroid .  Constipation: Started on MiraLAX  p.o. 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  Nutrition consult appreciated. They have recommended: 1 packet of Juven bid to support wound healing. Magic Cup tid with meals. Mighty shake tid with meals Ensure enlive Po TID. Vitamin C  500 mg bid. MVI daily. Patient on Manuella Blackson pured diet albumin  appears to be improving.  Weakness  Debility  MRI 3/27 without acute infarct Lower extremity weakness>upper (? Related to chronic bed bound status/LE wound/pain?)  Continue PT/OT   Left lower lobe pneumonia: Completed course of antibiotics. Resolved  DVT prophylaxis: lovenox  Family Communication: will call  daughter  5/19 (no answer) Status is: Inpatient Remains inpatient appropriate because: Awaiting skilled nursing facility placement. Code Status:  DNR  IV Access:   Peripheral IV   Procedures and diagnostic studies:   No results found.   Medical Consultants:   Palliative care Infectious disease Cardiology Surgery PCCM   Subjective:    [place] Gopal c/o ongoing pain in bottom  Objective:    Vitals:   04/08/24 0015 04/08/24 0429 04/08/24 0751 04/08/24 1156  BP: 120/74 119/70 117/68 118/81  Pulse: 76 76 79 78  Resp: 18 18 17    Temp: 98.1 F (36.7 C) 97.6 F (36.4 C) 97.7 F (36.5 C) 97.8 F (36.6 C)  TempSrc:      SpO2: 100% 100% 100% 100%  Weight:      Height:       SpO2: 100 %   Intake/Output Summary (Last 24 hours) at 04/08/2024 1500 Last data filed at 04/08/2024 1300 Gross per 24 hour  Intake 550 ml  Output 900 ml  Net -350 ml   Filed Weights   03/04/24 1656 03/15/24 0500  Weight: 68.5 kg 75.3 kg    Exam: General: No acute distress. Cardiovascular: RRR Lungs: unlabored Abdomen: Soft, nontender, nondistended Neurological: Alert and oriented 3. Moves all extremities 4 (LE weakness). Cranial nerves II through XII grossly intact. Skin: decub exam deferred today Extremities: No clubbing or cyanosis. No edema.  Data Reviewed:    Labs: Basic Metabolic Panel: Recent Labs  Lab 04/07/24 1015  NA 138  K 4.2  CL 105  CO2 24  GLUCOSE 206*  BUN 25*  CREATININE 0.89  CALCIUM  8.1*    GFR Estimated Creatinine Clearance: 68.2 mL/min (by C-G formula based on SCr of 0.89 mg/dL). Liver Function Tests: No results for input(s): "AST", "ALT", "ALKPHOS", "BILITOT", "PROT", "ALBUMIN " in the last 168 hours.  No results for input(s): "LIPASE", "AMYLASE" in the last 168 hours. No results for input(s): "AMMONIA" in the last 168 hours. Coagulation profile No results for input(s): "INR", "PROTIME" in the last 168 hours. COVID-19 Labs  No results for  input(s): "DDIMER", "FERRITIN", "LDH", "CRP" in the last 72 hours.  Lab Results  Component Value Date   SARSCOV2NAA POSITIVE (Chandra Feger) 03/04/2024   SARSCOV2NAA POSITIVE (Nakeesha Bowler) 02/14/2024   SARSCOV2NAA NEGATIVE 06/07/2019    CBC: Recent Labs  Lab 04/04/24 0647 04/07/24 1015  WBC 6.9 6.6  NEUTROABS 4.0 4.1  HGB 9.5* 9.5*  HCT 29.2* 29.5*  MCV 86.4 88.3  PLT 423* 373    Cardiac Enzymes: No results for input(s): "CKTOTAL", "CKMB", "CKMBINDEX", "TROPONINI" in the last 168 hours. BNP (last 3 results) No results for input(s): "PROBNP" in the last 8760 hours. CBG: Recent Labs  Lab 04/07/24 1208 04/07/24 1704 04/07/24 2053 04/08/24 0824 04/08/24 1127  GLUCAP 140* 95 148* 154* 204*   D-Dimer: No results for input(s): "DDIMER" in the last 72 hours. Hgb A1c: No results for input(s): "HGBA1C" in the last 72 hours. Lipid Profile: No results for input(s): "CHOL", "HDL", "LDLCALC", "TRIG", "CHOLHDL", "LDLDIRECT" in the last 72 hours. Thyroid  function studies: No results for input(s): "TSH", "T4TOTAL", "T3FREE", "THYROIDAB" in the last 72 hours.  Invalid input(s): "FREET3" Anemia work up: No results for input(s): "VITAMINB12", "FOLATE", "FERRITIN", "TIBC", "IRON", "RETICCTPCT" in the last 72 hours. Sepsis Labs: Recent Labs  Lab 04/04/24 0647 04/07/24 1015  WBC 6.9 6.6    Microbiology No results found for this or any previous visit (from the past 240 hours).   Medications:  acetaminophen   500 mg Oral TID   amiodarone   200 mg Oral Daily   apixaban   5 mg Oral BID   atorvastatin   40 mg Oral Daily   Chlorhexidine  Gluconate Cloth  6 each Topical Daily   collagenase    Topical Daily   diclofenac  Sodium  4 g Topical QID   feeding supplement  237 mL Oral TID BM   ferrous sulfate   325 mg Oral QHS   furosemide   20 mg Oral Daily   guaiFENesin   600 mg Oral BID   insulin  aspart  0-15 Units Subcutaneous TID WC   insulin  aspart  0-5 Units Subcutaneous QHS   insulin  aspart  4 Units  Subcutaneous TID WC   insulin  glargine-yfgn  7 Units Subcutaneous Daily   levothyroxine   50 mcg Oral Q0600   liver oil-zinc  oxide   Topical BID   midodrine   5 mg Oral TID WC   multivitamin with minerals  1 tablet Oral Daily   nutrition supplement (JUVEN)  1 packet Oral BID BM   mouth rinse  15 mL Mouth Rinse 4 times per day   oxyCODONE   10 mg Oral Q12H   phosphorus  500 mg Oral TID   polyethylene glycol  17 g Oral BID   senna-docusate  1 tablet Oral QHS   sodium chloride  flush  3 mL Intravenous Q12H   Tafamidis   1 capsule Oral Daily   Continuous Infusions:    LOS: 35 days   Donnetta Gains  [facility]  04/08/2024, 3:00 PM

## 2024-04-08 NOTE — Progress Notes (Signed)
 Occupational Therapy Treatment Patient Details Name: Earl [name] MRN: [id number] DOB: 05-08-1941 Today's Date: 04/08/2024   History of present illness 83 y.o. male admitted from [facility] SN[facility] rehab on 03/04/24 with SOB, sacral pain. Workup for acute metabolic encephalopathy, rhabdomyolysis, sepsis secondary to sacral wound infection, afib with RVR. S/p bedside sacral wound debridement 4/15 and 4/17. Of note, recent admit 3/26-4/08/2024 with fall, AMS, COVID; d/c to SNF rehab. Other PMH includes CVA (2021, residual R-side weakness), DM2, HTN, CHF, MGUS, afib on Eliquis .   OT comments  Pt progressing toward goals, provided with yellow theraband for BUE strengthening, pt able to demo 3 exercises, 5 reps each prior to fatiguing. Pt now with red tubing for utensils, educated pt and family on using on toothbrush for oral care, pt/family plan to trial tonight as pt has already brushed his teeth this morning. Pt presenting with impairments listed below, will follow acutely. Patient will benefit from continued inpatient follow up therapy, <3 hours/day to maximize safety/ind with ADL/functional mobility.       If plan is discharge home, recommend the following:  A lot of help with walking and/or transfers;A lot of help with bathing/dressing/bathroom;Assistance with cooking/housework;Assist for transportation;Help with stairs or ramp for entrance;Two people to help with walking and/or transfers   Equipment Recommendations  Other (comment) (defer)    Recommendations for Other Services      Precautions / Restrictions Precautions Precautions: Fall;Other (comment) Recall of Precautions/Restrictions: Intact Precaution/Restrictions Comments: significant pain from sacral wound Restrictions Weight Bearing Restrictions Per Provider Order: No       Mobility Bed Mobility Overal bed mobility: Needs Assistance Bed Mobility: Rolling Rolling: Max assist         General bed mobility comments:  total +2 to scoot up in bed, pt reaches for R bedrail with LUE    Transfers                         Balance                                           ADL either performed or assessed with clinical judgement   ADL Overall ADL's : Needs assistance/impaired                                       General ADL Comments: focus on UE therex    Extremity/Trunk Assessment Upper Extremity Assessment Upper Extremity Assessment: Generalized weakness (L >R) RUE Coordination: decreased fine motor LUE Coordination: decreased fine motor   Lower Extremity Assessment Lower Extremity Assessment: Defer to PT evaluation        Vision   Vision Assessment?: No apparent visual deficits   Perception Perception Perception: Not tested   Praxis Praxis Praxis: Not tested   Communication Communication Communication: Impaired Factors Affecting Communication: Reduced clarity of speech   Cognition Arousal: Alert Behavior During Therapy: WFL for tasks assessed/performed Cognition: No apparent impairments             OT - Cognition Comments: pt slower processing but pleasant and follows directions well.                 Following commands: Impaired Following commands impaired: Follows one step commands with increased time      Cueing  Cueing Techniques: Verbal cues, Tactile cues  Exercises Other Exercises Other Exercises: foward punches with BUE theraband Other Exercises: alternating punching across with yellow theraband    Shoulder Instructions       General Comments HR 88 with bed level activity    Pertinent Vitals/ Pain       Pain Assessment Pain Assessment: Faces Pain Score: 4  Faces Pain Scale: Hurts little more Pain Location: back, buttocks Pain Descriptors / Indicators: Grimacing, Guarding Pain Intervention(s): Limited activity within patient's tolerance, Monitored during session, Repositioned  Home Living                                           Prior Functioning/Environment              Frequency  Min 2X/week        Progress Toward Goals  OT Goals(current goals can now be found in the care plan section)  Progress towards OT goals: Progressing toward goals  Acute Rehab OT Goals Patient Stated Goal: none stated OT Goal Formulation: With patient/family Time For Goal Achievement: 04/16/24 Potential to Achieve Goals: Fair ADL Goals Pt Will Perform Eating: with supervision;with adaptive utensils;sitting;bed level Pt Will Perform Grooming: with supervision;bed level;with adaptive equipment Pt Will Perform Upper Body Bathing: with min assist;bed level Pt Will Perform Upper Body Dressing: with max assist;with mod assist;bed level Pt/caregiver will Perform Home Exercise Program: Both right and left upper extremity;With minimal assist;Increased ROM;Increased strength;With written HEP provided Additional ADL Goal #1: Pt will perform bed mobility with mod A for repositioning in order to prevent skin breakdown  Plan      Co-evaluation                 AM-PAC OT "6 Clicks" Daily Activity     Outcome Measure   Help from another person eating meals?: A Little Help from another person taking care of personal grooming?: Total Help from another person toileting, which includes using toliet, bedpan, or urinal?: Total Help from another person bathing (including washing, rinsing, drying)?: A Lot Help from another person to put on and taking off regular upper body clothing?: A Lot Help from another person to put on and taking off regular lower body clothing?: Total 6 Click Score: 10    End of Session    OT Visit Diagnosis: Other abnormalities of gait and mobility (R26.89);Muscle weakness (generalized) (M62.81);History of falling (Z91.81) Pain - Right/Left: Left Pain - part of body: Arm;Hand   Activity Tolerance Patient tolerated treatment well   Patient Left in  bed;with call bell/phone within reach;with family/visitor present   Nurse Communication          Time: 1610-9604 OT Time Calculation (min): 27 min  Charges: OT General Charges $OT Visit: 1 Visit OT Treatments $Therapeutic Activity: 23-37 mins  Earl [name], OTD, OTR/L SecureChat Preferred Acute Rehab [phone]   Earl [name] 04/08/2024, 9:56 AM

## 2024-04-08 NOTE — Plan of Care (Signed)
  Problem: Fluid Volume: Goal: Hemodynamic stability will improve Outcome: Progressing   Problem: Clinical Measurements: Goal: Diagnostic test results will improve Outcome: Progressing   Problem: Metabolic: Goal: Ability to maintain appropriate glucose levels will improve Outcome: Progressing   Problem: Nutritional: Goal: Maintenance of adequate nutrition will improve Outcome: Progressing   Problem: Clinical Measurements: Goal: Ability to maintain clinical measurements within normal limits will improve Outcome: Progressing   Problem: Pain Managment: Goal: General experience of comfort will improve and/or be controlled Outcome: Progressing   Problem: Safety: Goal: Ability to remain free from injury will improve Outcome: Progressing   Problem: Skin Integrity: Goal: Risk for impaired skin integrity will decrease Outcome: Progressing

## 2024-04-08 NOTE — Plan of Care (Signed)
  Problem: Fluid Volume: Goal: Hemodynamic stability will improve Outcome: Progressing   Problem: Clinical Measurements: Goal: Diagnostic test results will improve Outcome: Progressing Goal: Signs and symptoms of infection will decrease Outcome: Progressing   Problem: Respiratory: Goal: Ability to maintain adequate ventilation will improve Outcome: Progressing   Problem: Education: Goal: Knowledge of risk factors and measures for prevention of condition will improve Outcome: Progressing   Problem: Coping: Goal: Psychosocial and spiritual needs will be supported Outcome: Progressing   Problem: Respiratory: Goal: Will maintain a patent airway Outcome: Progressing Goal: Complications related to the disease process, condition or treatment will be avoided or minimized Outcome: Progressing   Problem: Education: Goal: Ability to describe self-care measures that may prevent or decrease complications (Diabetes Survival Skills Education) will improve Outcome: Progressing Goal: Individualized Educational Video(s) Outcome: Progressing   Problem: Coping: Goal: Ability to adjust to condition or change in health will improve Outcome: Progressing   Problem: Fluid Volume: Goal: Ability to maintain a balanced intake and output will improve Outcome: Progressing   Problem: Health Behavior/Discharge Planning: Goal: Ability to identify and utilize available resources and services will improve Outcome: Progressing Goal: Ability to manage health-related needs will improve Outcome: Progressing   Problem: Metabolic: Goal: Ability to maintain appropriate glucose levels will improve Outcome: Progressing   Problem: Nutritional: Goal: Maintenance of adequate nutrition will improve Outcome: Progressing Goal: Progress toward achieving an optimal weight will improve Outcome: Progressing   Problem: Skin Integrity: Goal: Risk for impaired skin integrity will decrease Outcome: Progressing    Problem: Tissue Perfusion: Goal: Adequacy of tissue perfusion will improve Outcome: Progressing   Problem: Education: Goal: Knowledge of General Education information will improve Description: Including pain rating scale, medication(s)/side effects and non-pharmacologic comfort measures Outcome: Progressing   Problem: Health Behavior/Discharge Planning: Goal: Ability to manage health-related needs will improve Outcome: Progressing   Problem: Clinical Measurements: Goal: Ability to maintain clinical measurements within normal limits will improve Outcome: Progressing Goal: Will remain free from infection Outcome: Progressing Goal: Diagnostic test results will improve Outcome: Progressing Goal: Respiratory complications will improve Outcome: Progressing Goal: Cardiovascular complication will be avoided Outcome: Progressing   Problem: Activity: Goal: Risk for activity intolerance will decrease Outcome: Progressing   Problem: Nutrition: Goal: Adequate nutrition will be maintained Outcome: Progressing   Problem: Coping: Goal: Level of anxiety will decrease Outcome: Progressing   Problem: Elimination: Goal: Will not experience complications related to bowel motility Outcome: Progressing Goal: Will not experience complications related to urinary retention Outcome: Progressing   Problem: Pain Managment: Goal: General experience of comfort will improve and/or be controlled Outcome: Progressing   Problem: Safety: Goal: Ability to remain free from injury will improve Outcome: Progressing   Problem: Skin Integrity: Goal: Risk for impaired skin integrity will decrease Outcome: Progressing

## 2024-04-09 ENCOUNTER — Ambulatory Visit: Payer: Medicare Other | Admitting: Internal Medicine

## 2024-04-09 DIAGNOSIS — A419 Sepsis, unspecified organism: Secondary | ICD-10-CM | POA: Diagnosis not present

## 2024-04-09 DIAGNOSIS — R6520 Severe sepsis without septic shock: Secondary | ICD-10-CM | POA: Diagnosis not present

## 2024-04-09 LAB — GLUCOSE, CAPILLARY
Glucose-Capillary: 116 mg/dL — ABNORMAL HIGH (ref 70–99)
Glucose-Capillary: 169 mg/dL — ABNORMAL HIGH (ref 70–99)
Glucose-Capillary: 120 mg/dL — ABNORMAL HIGH (ref 70–99)
Glucose-Capillary: 153 mg/dL — ABNORMAL HIGH (ref 70–99)

## 2024-04-09 NOTE — Progress Notes (Signed)
 Physical Therapy Treatment Patient Details Name: Earl [name] MRN: [id number] DOB: [birth year] Today's Date: 04/09/2024   History of Present Illness 83 y.o. male admitted from [facility] rehab on 03/04/24 with SOB, sacral pain. Workup for acute metabolic encephalopathy, rhabdomyolysis, sepsis secondary to sacral wound infection, afib with RVR. S/p bedside sacral wound debridement 4/15 and 4/17. Of note, recent admit 3/26-4/08/2024 with fall, AMS, COVID; d/c to SNF rehab. Other PMH includes CVA (2021, residual R-side weakness), DM2, HTN, CHF, MGUS, afib on Eliquis .    PT Comments  Pt received in supine, pleasantly agreeable to therapy session and with improved activity tolerance this date, with pt able to tolerate EOB sitting >15 mins and transfer trials with locked [employer]. Pt not able to reach full upright stance with STS trials from elevated surfaces, but able to lift hips a few inches off bed surface with +2 maxA and gait belt/bed pad assist. Pt with improved BUE strength and core activation but needs dense cues for body mechanics posture throughout. Pt with +BM observed toward end of session and RN/NT arriving to assist with hygiene assist/dressing change at end of session. Patient will benefit from continued inpatient follow up therapy, <3 hours/day     If plan is discharge home, recommend the following: Two people to help with walking and/or transfers;Two people to help with bathing/dressing/bathroom;Assistance with feeding;Assist for transportation   Can travel by private vehicle     No  Equipment Recommendations  Hospital bed;Hoyer lift (air mattress due to wounds)    Recommendations for Other Services       Precautions / Restrictions Precautions Precautions: Fall;Other (comment) Recall of Precautions/Restrictions: Intact Precaution/Restrictions Comments: significant pain from sacral wound; b/b incontinence Restrictions Weight Bearing Restrictions Per Provider  Order: No     Mobility  Bed Mobility Overal bed mobility: Needs Assistance Bed Mobility: Rolling, Sidelying to Sit, Sit to Sidelying Rolling: Max assist, Mod assist, Used rails Sidelying to sit: Max assist, HOB elevated, Used rails, +2 for physical assistance     Sit to sidelying: Max assist, +2 for physical assistance General bed mobility comments: sitting up to L EOB via log roll, good effort but pt becomes internally distracted due to anxiety/fear of falls and benefits from multimodal cues/hand over hand assist. Posterior and L lean sitting EOB, improves with visual/verbal cues.    Transfers Overall transfer level: Needs assistance   Transfers: Sit to/from Stand Sit to Stand: Max assist, Total assist, +2 physical assistance, From elevated surface, Via lift equipment           General transfer comment: partial STS (more of a squat) from elevated bed<>Stedy platform locked and +2 bed pad and gait belt assist x3 trials. Pt unable to fully extend hips/knees during trials but good ability to maintain his grip on stedy handles, pt maintains 2-5 seconds prior to fatigue and needing to sit back down. Transfer via Lift Equipment: Stedy  Ambulation/Gait                   Stairs             Wheelchair Mobility     Tilt Bed    Modified Rankin (Stroke Patients Only)       Balance Overall balance assessment: Needs assistance Sitting-balance support: Bilateral upper extremity supported, Feet supported Sitting balance-Leahy Scale: Poor Sitting balance - Comments: Pt sat EOB with MaxA to CGA for>15 mins; Pt was able to hold balance with mod cues with BUE support (L bed  rail and RUE pushing down into air mattress as foot of bed rail was not close enough to hold on to with good posture. Posterior lean throughout, improves briefly with cues. Pt also sitting up for a few mins with BUE supported by [facility] handles with CGA to close Supervision for safety Postural control:  Posterior lean, Left lateral lean Standing balance support: Bilateral upper extremity supported, Reliant on assistive device for balance Standing balance-Leahy Scale: Zero Standing balance comment: +2 maxA to totalA +2 for partial stand, not able to fully extend hips/knees                            Communication Communication Communication: Impaired Factors Affecting Communication: Reduced clarity of speech  Cognition Arousal: Alert Behavior During Therapy: WFL for tasks assessed/performed, Lability   PT - Cognitive impairments: Problem solving, Memory, Initiation, Sequencing                       PT - Cognition Comments: Pt pleasantly cooperative, RN had premedicated him according to discussion earlier in the day and pt following simple commands well as he is able. Pt initially tearful due to happiness at therapist arrival and expressed he was excited to work on mobility. Some slow processing. Following commands: Impaired Following commands impaired: Follows one step commands with increased time    Cueing Cueing Techniques: Verbal cues, Tactile cues, Gestural cues  Exercises General Exercises - Lower Extremity Heel Slides: AAROM, Both, 5 reps, Supine Hip ABduction/ADduction: AROM, AAROM, Both, 5 reps, Supine (cues to move his legs while pt rolling in supine) Hip Flexion/Marching: AROM, Both, 5 reps, Seated (cues to lift his feet/knees up when Stedy being brought to him prior to placing his feet up on platform.) Toe Raises: AROM, Both, 10 reps, Seated Heel Raises: AROM, Both, 10 reps, Seated Other Exercises Other Exercises: seated BUE AROM: cross-body reaching with shoulder flexion to L/R sides with cues for core activation x5 reps ea Other Exercises: seated lateral lean with elbow taps to L/R sides sitting EOB    General Comments General comments (skin integrity, edema, etc.): bowel incontinence observed when pt attempting standing trials, pt rolling to L/R once  returned to supine for hygiene assist and to remove soiled bed pad/place new pad, RN notified pt will need dressing change and more thorough hygiene assist at end of session. RN arriving after session with NT to assist patient.      Pertinent Vitals/Pain Pain Assessment Pain Assessment: Faces Faces Pain Scale: Hurts little more Pain Location: back, buttocks and bil knees Pain Descriptors / Indicators: Grimacing, Guarding, Discomfort, Sore Pain Intervention(s): Monitored during session, Premedicated before session, Repositioned    Home Living                          Prior Function            PT Goals (current goals can now be found in the care plan section) Acute Rehab PT Goals Patient Stated Goal: To improve strength and mobility to reduce risk for falls. PT Goal Formulation: With patient/family Time For Goal Achievement: 04/11/24 Progress towards PT goals: Progressing toward goals    Frequency    Min 1X/week      PT Plan      Co-evaluation              AM-PAC PT "6 Clicks" Mobility   Outcome Measure  Help needed turning from your back to your side while in a flat bed without using bedrails?: Total Help needed moving from lying on your back to sitting on the side of a flat bed without using bedrails?: Total Help needed moving to and from a bed to a chair (including a wheelchair)?: Total Help needed standing up from a chair using your arms (e.g., wheelchair or bedside chair)?: Total Help needed to walk in hospital room?: Total Help needed climbing 3-5 steps with a railing? : Total 6 Click Score: 6    End of Session Equipment Utilized During Treatment: Gait belt;Other (comment) (+ bed pad assist) Activity Tolerance: Patient tolerated treatment well Patient left: in bed;with call bell/phone within reach;with bed alarm set;Other (comment);with nursing/sitter in room (RN arriving; pt up in bed chair posture as he had requested to drink some sips of  water) Nurse Communication: Mobility status;Need for lift equipment;Other (comment) (pt needs dressing change and more thorough hygiene assist) PT Visit Diagnosis: Muscle weakness (generalized) (M62.81);Adult, failure to thrive (R62.7);Difficulty in walking, not elsewhere classified (R26.2);Pain Pain - part of body: Knee (knees, bottom, lower back)     Time: 4098-1191 PT Time Calculation (min) (ACUTE ONLY): 38 min  Charges:    $Therapeutic Exercise: 8-22 mins $Therapeutic Activity: 23-37 mins PT General Charges $$ ACUTE PT VISIT: 1 Visit                     Luccia Reinheimer P., PTA Acute Rehabilitation Services Secure Chat Preferred 9a-5:30pm Office: [phone]    Mariel Shope [place] 04/09/2024, 1:15 PM

## 2024-04-09 NOTE — Progress Notes (Addendum)
 [facility] PROGRESS NOTE    Progress Note  Javontay Sheer  ZOX:[id number] DOB: 06/21/1941 DOA: 03/04/2024 PCP: [name], MD     Brief Narrative:   Suzanne Hannon is an 83 y.o. 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  Critical care was consulted for persistent hypotension.  Respiratory panel was negative.  General surgery was consulted for sacral wound debridement with bedside excision on 03/05/2024 and 03/07/2024.  He is now hemodynamically stable for discharge.  Assessment/Plan:   Severe sepsis secondary to chronic infected pressure ulcers stage III: Sepsis is resolved. With pain and hypotension on admission. CT scan of the abdomen pelvis shows sacral decubitus ulcer extending to the right gluteus muscle with no evidence of osteomyelitis. Seen by PCCM started empirically on antibiotics which he has completed his course on 03/21/2024. Surgery now signed off - he underwent bedside excisional debridement on 03/05/2024 and 03/07/2024. ID signed off 4/22, he's now completed antibiotics.  "Mainstay will be wound care and nutritional support" Patient is medically stable to be transferred to skilled nursing facility.  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 Turn every 2 hours out of bed to chair, try if possible to set him up in the chair daily. Recently eating 100% of meals Ensure 3 times daily. Nutrition has been consulted.  History of paroxysmal atrial fibrillation (HCC) amiodarone  and Eliquis  Cards has signed off  Remains on telemetry due to the his daughter's preference  History of HFrEF/cardiac amyloid/nonischemic cardiomyopathy: Entresto  and metoprolol  held on admission. Cardiology recommended to follow-up Blood pressure seems to be stable off Entresto  and metoprolol . Now on lasix , midodrine  for hypotension (holding parameters) On tafamadis for amyloid  Last note from cards was 5/2.  Appears they signed off on 4/21 [facility]).  Insulin -dependent diabetes mellitus type 2: With an A1c of 8.4. Continue long-acting insulin  plus sliding scale. BG's appropriate  GERD: Stable.  Anemia of chronic disease: S/p 1 unit pRBC 4/30  Hypothyroidism: Synthroid .  Constipation: Started on MiraLAX  p.o. 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  Nutrition consult appreciated. They have recommended: 1 packet of Juven bid to support wound healing. Magic Cup tid with meals. Mighty shake tid with meals Ensure enlive Po TID. Vitamin C  500 mg bid. MVI daily. Patient on Delylah Stanczyk pured diet albumin  appears to be improving.  Weakness  Debility  MRI 3/27 without acute infarct Lower extremity weakness>upper (? Related to chronic bed bound status/LE wound/pain?)  Continue PT/OT   Left lower lobe pneumonia: Completed course of antibiotics. Resolved  DVT prophylaxis: lovenox  Family Communication: will  call daughter 5/19 (no  answer).  Not at bedside 5/20.  Called and no answer 5/20. Status is: Inpatient Remains inpatient appropriate because: Awaiting skilled nursing facility placement. Code Status:  DNR  IV Access:   Peripheral IV   Procedures and diagnostic studies:   No results found.   Medical Consultants:   Palliative care Infectious disease Cardiology Surgery PCCM   Subjective:    Sarthak Plato no new complaints Daughter not at bedside  Objective:    Vitals:   04/08/24 1629 04/08/24 2014 04/09/24 0455 04/09/24 0805  BP: 104/60 120/67 122/64 109/69  Pulse: 71 74 78 85  Resp: 19 18 18    Temp: 97.7 F (36.5 C) 97.9 F (36.6 C) 97.6 F (36.4 C) 98.2 F (36.8 C)  TempSrc:  Oral Oral   SpO2: 100% 100% 100% 99%  Weight:      Height:       SpO2: 99 %   Intake/Output Summary (Last 24 hours) at 04/09/2024 1141 Last data filed at 04/09/2024 0453 Gross per 24 hour  Intake 300 ml  Output 2300 ml  Net -2000 ml   Filed Weights   03/04/24 1656 03/15/24 0500  Weight: 68.5 kg 75.3 kg    Exam: General: No acute distress. Frail. Tired appearing.  Chronically ill appearing. Cardiovascular: RRR Lungs: unlabored Abdomen: Soft, nontender, nondistended  Neurological: Alert and oriented 3. Moves all extremities 4. Cranial nerves II through XII grossly intact. Skin: Wounds not examined today Extremities: No clubbing or cyanosis. No edema.   Data Reviewed:    Labs: Basic Metabolic Panel: Recent Labs  Lab 04/07/24 1015  NA 138  K 4.2  CL 105  CO2 24  GLUCOSE 206*  BUN 25*  CREATININE 0.89  CALCIUM  8.1*    GFR Estimated Creatinine Clearance: 68.2 mL/min (by C-G formula based on SCr of 0.89 mg/dL). Liver Function Tests: No results for input(s): "AST", "ALT", "ALKPHOS", "BILITOT", "PROT", "ALBUMIN " in the last 168 hours.  No results for input(s): "LIPASE", "AMYLASE" in the last 168 hours. No results for input(s): "AMMONIA" in the last 168 hours. Coagulation  profile No results for input(s): "INR", "PROTIME" in the last 168 hours. COVID-19 Labs  No results for input(s): "DDIMER", "FERRITIN", "LDH", "CRP" in the last 72 hours.  Lab Results  Component Value Date   SARSCOV2NAA POSITIVE (Jamala Kohen) 03/04/2024   SARSCOV2NAA POSITIVE (Manley Fason) 02/14/2024   SARSCOV2NAA NEGATIVE 06/07/2019    CBC: Recent Labs  Lab 04/04/24 0647 04/07/24 1015  WBC 6.9 6.6  NEUTROABS 4.0 4.1  HGB 9.5* 9.5*  HCT 29.2* 29.5*  MCV 86.4 88.3  PLT 423* 373    Cardiac Enzymes: No results for input(s): "CKTOTAL", "CKMB", "CKMBINDEX", "TROPONINI" in the last 168 hours. BNP (last 3 results) No results for input(s): "PROBNP" in the last 8760 hours. CBG: Recent Labs  Lab 04/08/24 0824 04/08/24 1127 04/08/24 1629 04/08/24 2015 04/09/24 0805  GLUCAP 154* 204* 94 136* 116*   D-Dimer: No results for input(s): "DDIMER" in the last 72 hours. Hgb A1c: No results for input(s): "HGBA1C" in the last 72 hours. Lipid Profile: No results for input(s): "CHOL", "HDL", "LDLCALC", "TRIG", "CHOLHDL", "LDLDIRECT" in the last 72 hours. Thyroid  function studies: No results for input(s): "TSH", "T4TOTAL", "T3FREE", "THYROIDAB" in the last 72 hours.  Invalid input(s): "FREET3" Anemia work up: No results for input(s): "VITAMINB12", "FOLATE", "FERRITIN", "TIBC", "IRON", "RETICCTPCT" in the last 72 hours. Sepsis Labs: Recent Labs  Lab 04/04/24 0647 04/07/24 1015  WBC 6.9 6.6  Microbiology No results found for this or any previous visit (from the past 240 hours).   Medications:    acetaminophen   500 mg Oral TID   amiodarone   200 mg Oral Daily   apixaban   5 mg Oral BID   atorvastatin   40 mg Oral Daily   Chlorhexidine  Gluconate Cloth  6 each Topical Daily   collagenase    Topical Daily   diclofenac  Sodium  4 g Topical QID   feeding supplement  237 mL Oral TID BM   ferrous sulfate   325 mg Oral QHS   furosemide   20 mg Oral Daily   guaiFENesin   600 mg Oral BID   insulin  aspart   0-15 Units Subcutaneous TID WC   insulin  aspart  0-5 Units Subcutaneous QHS   insulin  aspart  4 Units Subcutaneous TID WC   insulin  glargine-yfgn  7 Units Subcutaneous Daily   levothyroxine   50 mcg Oral Q0600   liver oil-zinc  oxide   Topical BID   midodrine   5 mg Oral TID WC   multivitamin with minerals  1 tablet Oral Daily   nutrition supplement (JUVEN)  1 packet Oral BID BM   mouth rinse  15 mL Mouth Rinse 4 times per day   oxyCODONE   10 mg Oral Q12H   phosphorus  500 mg Oral TID   polyethylene glycol  17 g Oral BID   senna-docusate  1 tablet Oral QHS   sodium chloride  flush  3 mL Intravenous Q12H   Tafamidis   1 capsule Oral Daily   Continuous Infusions:    LOS: 36 days   Donnetta Gains  [facility]  04/09/2024, 11:41 AM

## 2024-04-09 NOTE — Consult Note (Signed)
 WOC Nurse wound follow up   WOC team made two attempts to follow up with patient's daughter today.  Daughter not present at bedside at each visit.  WOC team will re-attempt again if time permits.  Spoke with charge nurse on unit regarding current wound care orders.  Gillermo Lack, RN, MSN, [facility] WOC Team

## 2024-04-10 ENCOUNTER — Ambulatory Visit: Payer: Medicare Other | Admitting: Podiatry

## 2024-04-10 DIAGNOSIS — R6520 Severe sepsis without septic shock: Secondary | ICD-10-CM | POA: Diagnosis not present

## 2024-04-10 DIAGNOSIS — A419 Sepsis, unspecified organism: Secondary | ICD-10-CM | POA: Diagnosis not present

## 2024-04-10 LAB — MAGNESIUM: Magnesium: 1.8 mg/dL (ref 1.7–2.4)

## 2024-04-10 LAB — CBC
HCT: 31 % — ABNORMAL LOW (ref 39.0–52.0)
Hemoglobin: 10.2 g/dL — ABNORMAL LOW (ref 13.0–17.0)
MCH: 28.6 pg (ref 26.0–34.0)
MCHC: 32.9 g/dL (ref 30.0–36.0)
MCV: 86.8 fL (ref 80.0–100.0)
Platelets: 340 10*3/uL (ref 150–400)
RBC: 3.57 MIL/uL — ABNORMAL LOW (ref 4.22–5.81)
RDW: 16.3 % — ABNORMAL HIGH (ref 11.5–15.5)
WBC: 6.1 10*3/uL (ref 4.0–10.5)
nRBC: 0 % (ref 0.0–0.2)

## 2024-04-10 LAB — GLUCOSE, CAPILLARY
Glucose-Capillary: 120 mg/dL — ABNORMAL HIGH (ref 70–99)
Glucose-Capillary: 228 mg/dL — ABNORMAL HIGH (ref 70–99)
Glucose-Capillary: 165 mg/dL — ABNORMAL HIGH (ref 70–99)
Glucose-Capillary: 183 mg/dL — ABNORMAL HIGH (ref 70–99)

## 2024-04-10 LAB — BASIC METABOLIC PANEL WITH GFR
Anion gap: 11 (ref 5–15)
BUN: 24 mg/dL — ABNORMAL HIGH (ref 8–23)
CO2: 21 mmol/L — ABNORMAL LOW (ref 22–32)
Calcium: 8 mg/dL — ABNORMAL LOW (ref 8.9–10.3)
Chloride: 108 mmol/L (ref 98–111)
Creatinine, Ser: 0.84 mg/dL (ref 0.61–1.24)
GFR, Estimated: >60 mL/min (ref >60)
Glucose, Bld: 109 mg/dL — ABNORMAL HIGH (ref 70–99)
Potassium: 3.6 mmol/L (ref 3.5–5.1)
Sodium: 140 mmol/L (ref 135–145)

## 2024-04-10 LAB — PHOSPHORUS: Phosphorus: 4.2 mg/dL (ref 2.5–4.6)

## 2024-04-10 NOTE — Progress Notes (Signed)
 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  Critical care was consulted for persistent hypotension.  Respiratory panel was negative.  General surgery was consulted for sacral wound debridement with bedside excision on 03/05/2024 and 03/07/2024.  He is now hemodynamically stable for discharge.    Assessment & Plan:   Principal Problem:   Severe sepsis (HCC) Active Problems:   Paroxysmal atrial fibrillation (HCC)   Sepsis (HCC)   Atrial flutter (HCC)   Sacral ulcer (HCC)   MGUS (monoclonal gammopathy of unknown significance)   Essential hypertension   Hyperlipidemia   Insulin  dependent type 2 diabetes mellitus (HCC)   History of CVA (cerebrovascular accident)   Cardiac amyloidosis (HCC)   Chronic systolic CHF (congestive heart failure) (HCC)   GERD (gastroesophageal reflux disease)   History of hypertension   Atrial fibrillation with RVR (HCC)   Community acquired pneumonia   Protein-calorie malnutrition, severe   Severe sepsis secondary to 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 He presented with pain and hypotension on admission. CT A/P shows sacral decubitus ulcer extending to the right gluteus muscle with no evidence of osteomyelitis. Seen by PCCM,  started empirically on antibiotics which he has completed his course on  03/21/2024. Surgery now signed off - He underwent bedside excisional debridement on 03/05/2024 and 03/07/2024. ID signed off 4/22, he's now completed antibiotics.  "Mainstay will be wound care and nutritional support". Patient is medically stable to be transferred to skilled nursing facility.   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 Should expect months to years for complete healing. S/p excisional debridement on 4/15, 4/17 General surgery signed off on 4/21 (note from [employer], PA-C) Currently on no overlay mattress. Turn every 2 hours . Encourage out of bed to chair, try if possible to set him up in the chair daily. Recently eating 100% of meals. Ensure 3 times daily. Nutrition has been consulted.   History of paroxysmal atrial fibrillation (HCC) Continue amiodarone  and Eliquis . Cardiology has signed off.  Remains on telemetry due to the his daughter's preference.   History of HFrEF/ Cardiac amyloidosis /Nonischemic cardiomyopathy: Entresto  and metoprolol  held on admission. Cardiology recommended to follow-up. Blood pressure seems to be stable off Entresto  and metoprolol . Now on lasix , midodrine  for hypotension (holding parameters) On tafamadis for amyloid. Last note from cards was 5/2.  Appears they signed off on 4/21 [facility]).   Insulin -dependent diabetes mellitus type 2: HbA1c 8.4. Continue long-acting insulin  plus sliding scale. BG's appropriate   GERD: Stable.   Anemia of chronic disease: S/p 1 unit pRBC 4/30 Last Hb 10.2   Hypothyroidism: Continue Synthroid .   Constipation: Started on MiraLAX  p.o. twice daily.   Stage 4 sacral decubitus ulcer present on admission Last seen by WOC team on 03/28/24 Appreciate wound care (see above and orders for details). Wound care team has  met with the daughter at bedside.    Dysphagia: Severe Protein Calorie  Malnutrition Prealbumin of less than 5.  Nutrition consult appreciated. They have recommended: 1 packet of Juven bid to support wound healing. Magic Cup tid with meals. Mighty shake tid with meals Ensure enlive Po TID. Vitamin C  500 mg bid. MVI daily. Patient on a pured diet albumin  appears to be improving.   Weakness  Debility  MRI 3/27 without acute infarct. Lower extremity weakness>upper (? Related to chronic bed bound status/LE wound/pain?)  Continue PT/OT    Left lower lobe pneumonia: Completed course of antibiotics. Resolved.   DVT prophylaxis: Lovenox  Code Status: DNR Family Communication: Daughter at bed side Disposition Plan:    Status is: Inpatient Remains inpatient appropriate because: Severity of illness.   Consultants:  Wound care  Procedures: None  Antimicrobials: Anti-infectives (From admission, onward)    Start     Dose/Rate Route Frequency Ordered Stop   03/16/24 1015  cefTRIAXone  (ROCEPHIN ) 2 g in sodium chloride  0.9 % 100 mL IVPB        2 g 200 mL/hr over 30 Minutes Intravenous Every 24 hours 03/16/24 0929 03/20/24 1219   03/16/24 1015  doxycycline  (VIBRA -TABS) tablet 100 mg        100 mg Oral Every 12 hours 03/16/24 0929 03/20/24 2131   03/14/24 1000  metroNIDAZOLE  (FLAGYL ) tablet 500 mg        500 mg Oral Every 12 hours 03/14/24 0803 03/19/24 2219   03/13/24 1000  cefadroxil  (DURICEF) capsule 1,000 mg  Status:  Discontinued        1,000 mg Oral 2 times daily 03/12/24 1632 03/16/24 0929   03/07/24 2200  metroNIDAZOLE  (FLAGYL ) tablet 500 mg        500 mg Oral Every 12 hours 03/07/24 0840 03/11/24 2258   03/05/24 1930  vancomycin  (VANCOREADY) IVPB 1250 mg/250 mL  Status:  Discontinued        1,250 mg 166.7 mL/hr over 90 Minutes Intravenous Every 24 hours 03/04/24 2205 03/04/24 2224   03/05/24 1930  Vancomycin  (VANCOCIN ) 1,250 mg in sodium chloride  0.9 % 250 mL IVPB  Status:  Discontinued        1,250 mg 166.7 mL/hr over 90 Minutes Intravenous  Every 24 hours 03/04/24 2224 03/05/24 0717   03/05/24 1930  vancomycin  (VANCOREADY) IVPB 1500 mg/300 mL  Status:  Discontinued        1,500 mg 150 mL/hr over 120 Minutes Intravenous Every 24 hours 03/05/24 0717 03/12/24 1636   03/05/24 1400  ceFEPIme  (MAXIPIME ) 2 g in sodium chloride  0.9 % 100 mL IVPB  Status:  Discontinued        2 g 200 mL/hr over 30 Minutes Intravenous Every 8 hours 03/05/24 0717 03/12/24 1632   03/05/24 0600  ceFEPIme  (MAXIPIME ) 2 g in sodium chloride  0.9 % 100 mL IVPB  Status:  Discontinued        2 g 200 mL/hr over 30 Minutes Intravenous Every 12 hours 03/04/24 2205 03/05/24 0717   03/05/24 0000  metroNIDAZOLE  (FLAGYL ) IVPB 500 mg  Status:  Discontinued        500 mg 100 mL/hr over 60 Minutes Intravenous Every 12 hours 03/04/24 2206 03/04/24 2209   03/04/24 1730  aztreonam (AZACTAM) 2 g in sodium chloride  0.9 % 100 mL IVPB  Status:  Discontinued        2 g 200 mL/hr over 30 Minutes Intravenous  Once 03/04/24 1715 03/04/24 1724   03/04/24 1730  metroNIDAZOLE  (  FLAGYL ) IVPB 500 mg  Status:  Discontinued        500 mg 100 mL/hr over 60 Minutes Intravenous Every 12 hours 03/04/24 1715 03/07/24 0840   03/04/24 1730  vancomycin  (VANCOREADY) IVPB 1500 mg/300 mL        1,500 mg 150 mL/hr over 120 Minutes Intravenous  Once 03/04/24 1715 03/04/24 2153   03/04/24 1730  ceFEPIme  (MAXIPIME ) 2 g in sodium chloride  0.9 % 100 mL IVPB        2 g 200 mL/hr over 30 Minutes Intravenous  Once 03/04/24 1724 03/04/24 1826      Subjective: Patient was seen and examined at bedside.  Overnight events noted. Daughter at bedside who states she is concerned about the healing of the sacral wound.  Objective: Vitals:   04/09/24 2010 04/09/24 2359 04/10/24 0532 04/10/24 0806  BP: 112/67 121/68 116/67 115/64  Pulse: 76 72 78 77  Resp: 20 20 19 16   Temp: 98.2 F (36.8 C) 97.9 F (36.6 C) 98.4 F (36.9 C) 98.1 F (36.7 C)  TempSrc: Oral Oral Oral   SpO2: 100% 100% 100% 99%  Weight:       Height:        Intake/Output Summary (Last 24 hours) at 04/10/2024 1138 Last data filed at 04/10/2024 0535 Gross per 24 hour  Intake --  Output 1150 ml  Net -1150 ml   Filed Weights   03/04/24 1656 03/15/24 0500  Weight: 68.5 kg 75.3 kg    Examination:  General exam: Appears calm and comfortable, deconditioned, not in any acute distress. Respiratory system: CTA Bilaterally. Respiratory effort normal.  RR 15 Cardiovascular system: S1 & S2 heard, RRR. No JVD, murmurs, rubs, gallops or clicks.  Gastrointestinal system: Abdomen is non distended, soft and non tender. Normal bowel sounds heard. Central nervous system: Alert and oriented x 3. No focal neurological deficits. Extremities: No edema, no cyanosis, no clubbing. Skin: Decubitus ulcer stage III-IV Psychiatry: Judgement and insight appear normal. Mood & affect appropriate.     Data Reviewed: I have personally reviewed following labs and imaging studies  CBC: Recent Labs  Lab 04/04/24 0647 04/07/24 1015 04/10/24 0552  WBC 6.9 6.6 6.1  NEUTROABS 4.0 4.1  --   HGB 9.5* 9.5* 10.2*  HCT 29.2* 29.5* 31.0*  MCV 86.4 88.3 86.8  PLT 423* 373 340   Basic Metabolic Panel: Recent Labs  Lab 04/07/24 1015 04/10/24 0552  NA 138 140  K 4.2 3.6  CL 105 108  CO2 24 21*  GLUCOSE 206* 109*  BUN 25* 24*  CREATININE 0.89 0.84  CALCIUM  8.1* 8.0*  MG  --  1.8  PHOS  --  4.2   GFR: Estimated Creatinine Clearance: 72.2 mL/min (by C-G formula based on SCr of 0.84 mg/dL). Liver Function Tests: No results for input(s): "AST", "ALT", "ALKPHOS", "BILITOT", "PROT", "ALBUMIN " in the last 168 hours. No results for input(s): "LIPASE", "AMYLASE" in the last 168 hours. No results for input(s): "AMMONIA" in the last 168 hours. Coagulation Profile: No results for input(s): "INR", "PROTIME" in the last 168 hours. Cardiac Enzymes: No results for input(s): "CKTOTAL", "CKMB", "CKMBINDEX", "TROPONINI" in the last 168 hours. BNP (last 3  results) No results for input(s): "PROBNP" in the last 8760 hours. HbA1C: No results for input(s): "HGBA1C" in the last 72 hours. CBG: Recent Labs  Lab 04/09/24 0805 04/09/24 1211 04/09/24 1615 04/09/24 2009 04/10/24 0754  GLUCAP 116* 169* 120* 153* 120*   Lipid Profile: No results for input(s): "  CHOL", "HDL", "LDLCALC", "TRIG", "CHOLHDL", "LDLDIRECT" in the last 72 hours. Thyroid  Function Tests: No results for input(s): "TSH", "T4TOTAL", "FREET4", "T3FREE", "THYROIDAB" in the last 72 hours. Anemia Panel: No results for input(s): "VITAMINB12", "FOLATE", "FERRITIN", "TIBC", "IRON", "RETICCTPCT" in the last 72 hours. Sepsis Labs: No results for input(s): "PROCALCITON", "LATICACIDVEN" in the last 168 hours.  No results found for this or any previous visit (from the past 240 hours).   Radiology Studies: No results found.  Scheduled Meds:  acetaminophen   500 mg Oral TID   amiodarone   200 mg Oral Daily   apixaban   5 mg Oral BID   atorvastatin   40 mg Oral Daily   Chlorhexidine  Gluconate Cloth  6 each Topical Daily   collagenase    Topical Daily   diclofenac  Sodium  4 g Topical QID   feeding supplement  237 mL Oral TID BM   ferrous sulfate   325 mg Oral QHS   furosemide   20 mg Oral Daily   guaiFENesin   600 mg Oral BID   insulin  aspart  0-15 Units Subcutaneous TID WC   insulin  aspart  0-5 Units Subcutaneous QHS   insulin  aspart  4 Units Subcutaneous TID WC   insulin  glargine-yfgn  7 Units Subcutaneous Daily   levothyroxine   50 mcg Oral Q0600   liver oil-zinc  oxide   Topical BID   midodrine   5 mg Oral TID WC   multivitamin with minerals  1 tablet Oral Daily   nutrition supplement (JUVEN)  1 packet Oral BID BM   mouth rinse  15 mL Mouth Rinse 4 times per day   oxyCODONE   10 mg Oral Q12H   phosphorus  500 mg Oral TID   polyethylene glycol  17 g Oral BID   senna-docusate  1 tablet Oral QHS   sodium chloride  flush  3 mL Intravenous Q12H   Tafamidis   1 capsule Oral Daily    Continuous Infusions:   LOS: 37 days    Time spent: 50 mins    Magdalene School, MD [facility]   If 7PM-7AM, please contact night-coverage

## 2024-04-10 NOTE — TOC Progression Note (Addendum)
 Transition of Care [facility]) - Progression Note    Patient Details  Name: Earl [name] MRN: [id number] Date of Birth: June 17, 1941  Transition of Care [facility] CM/SW Contact  Reygan Heagle A [place], LCSW Phone Number: 04/10/2024, 4:27 PM  Clinical Narrative:     CSW spoke with pt's daughter, Para at bedside. She said that she had not spoken to or followed up with [facility] yet. Said that she was mainly concerned with his wound care as it had changed from last week. She hasn't had a chance to follow up with the facility but did send her questions to them. CSW to follow up again regarding and assist with questions regarding decision making choice.   Occupational therapy came to assist pt back to bed, requested CSW come back at another time.    TOC will continue to follow.   Expected Discharge Plan: Skilled Nursing Facility Barriers to Discharge: Continued Medical Work up  Expected Discharge Plan and Services In-house Referral: Clinical Social Work     Living arrangements for the past 2 months:  (from [facility] short term rehab)                                       Social Determinants of Health (SDOH) Interventions SDOH Screenings   Food Insecurity: No Food Insecurity (03/05/2024)  Housing: Low Risk  (03/05/2024)  Transportation Needs: No Transportation Needs (03/05/2024)  Utilities: Not At Risk (03/05/2024)  Depression (PHQ2-9): Low Risk  (10/03/2023)  Financial Resource Strain: Patient Declined (01/16/2024)  Physical Activity: Unknown (01/16/2024)  Social Connections: Moderately Integrated (03/05/2024)  Stress: Stress Concern Present (01/16/2024)  Tobacco Use: Low Risk  (03/04/2024)    Readmission Risk Interventions    03/19/2024   10:24 AM  Readmission Risk Prevention Plan  Transportation Screening Complete  Medication Review (RN Care Manager) Complete  PCP or Specialist appointment within 3-5 days of discharge Complete  HRI or Home Care Consult Complete  SW Recovery  Care/Counseling Consult Complete  Palliative Care Screening Complete  Skilled Nursing Facility Complete

## 2024-04-10 NOTE — Progress Notes (Signed)
 Occupational Therapy Treatment Patient Details Name: Earl [name] MRN: [id number] DOB: 17-Mar-1941 Today's Date: 04/10/2024   History of present illness 83 y.o. male admitted from [facility] rehab on 03/04/24 with SOB, sacral pain. Workup for acute metabolic encephalopathy, rhabdomyolysis, sepsis secondary to sacral wound infection, afib with RVR. S/p bedside sacral wound debridement 4/15 and 4/17. Of note, recent admit 3/26-4/08/2024 with fall, AMS, COVID; d/c to SNF rehab. Other PMH includes CVA (2021, residual R-side weakness), DM2, HTN, CHF, MGUS, afib on Eliquis .   OT comments  Pt progressing OOB tolerance this session, with use of ROHO cushion(and regular pillow underneath) the pt was able to tolerate a little over 30 mins of sitting in the recliner, required mod A+2 to stand using stedy. More power output coming from pt's BUEs vs BLEs to assist with standing effort. He displays a strong R lean and appears anxious with mobility, multi modal cues needed to redirect pt posture back to midline. OT to continue to progress pt as able, DC plans remain appropriate for skilled rehab <3hrs of therapy.       If plan is discharge home, recommend the following:  A lot of help with walking and/or transfers;A lot of help with bathing/dressing/bathroom;Assistance with cooking/housework;Assist for transportation;Help with stairs or ramp for entrance;Two people to help with walking and/or transfers   Equipment Recommendations  Other (comment) (defer)    Recommendations for Other Services      Precautions / Restrictions Precautions Precautions: Fall;Other (comment) Recall of Precautions/Restrictions: Intact Precaution/Restrictions Comments: significant pain from sacral wound; b/b incontinence Restrictions Weight Bearing Restrictions Per Provider Order: No       Mobility Bed Mobility Overal bed mobility: Needs Assistance Bed Mobility: Rolling, Sidelying to Sit, Sit to Sidelying Rolling:  Max assist, Used rails Sidelying to sit: Used rails, +2 for physical assistance, Mod assist     Sit to sidelying: Max assist, +2 for physical assistance General bed mobility comments: Max A+2 to assist with RLE repositioning during bed mobility and to raise trunk into upright position. At time pt with strong R lean, almost resistive and needing cues to encourage more weight shifting to L side.    Transfers Overall transfer level: Needs assistance Equipment used: Ambulation equipment used Transfers: Sit to/from Stand, Bed to chair/wheelchair/BSC Sit to Stand: Mod assist, +2 physical assistance, +2 safety/equipment, Via lift equipment Stand pivot transfers: Total assist         General transfer comment: used stedy + rocking technique to perfrom STS transfer to recliner with mod A +2, pt BUEs fatigued quickly following acheiving perched position on stedy. Total A to pivot back to bed 30 mins after the session. Transfer via Lift Equipment: Stedy   Balance Overall balance assessment: Needs assistance Sitting-balance support: Bilateral upper extremity supported, Feet supported Sitting balance-Leahy Scale: Poor Sitting balance - Comments: strong R lean, max A and progressed to breif  periods of CGA with cues and RUE support on bed rail. Postural control: Right lateral lean Standing balance support: Bilateral upper extremity supported, Reliant on assistive device for balance Standing balance-Leahy Scale: Zero Standing balance comment: mod A+2 to total A                           ADL either performed or assessed with clinical judgement   ADL     Eating/Feeding Details (indicate cue type and reason): per pt daughter he has been able to feed himself now; unable to assess today  as it was not time for feeding.         Lower Body Bathing: Total assistance                         General ADL Comments: focused session on progressing OOB tolerance. Discussed with pt and  daughter the use of ROHO cushion and the benefits of it compared to other cushions, pt was able to tolearate ~30 mins of sitting in recliner using the cushion before needing to return following onset of sacral pain.    Extremity/Trunk Assessment              Diplomatic Services operational officer Communication Communication: Impaired Factors Affecting Communication: Reduced clarity of speech   Cognition Arousal: Alert Behavior During Therapy: WFL for tasks assessed/performed Cognition: No apparent impairments             OT - Cognition Comments: follows simple commands with more time, slow to process.                 Following commands: Impaired Following commands impaired: Follows one step commands with increased time      Cueing   Cueing Techniques: Verbal cues, Tactile cues, Gestural cues  Exercises Other Exercises Other Exercises: L lateral elbow prop with return to midline x3 Other Exercises: Visual and gestural cues to reposition into midline while holding onto stedy.    Shoulder Instructions       General Comments pt with small bowel movement noted at end of transfer. RN in room at end of session and aware, daughter present and supportive.    Pertinent Vitals/ Pain       Pain Assessment Pain Assessment: Faces Faces Pain Scale: Hurts even more Pain Location: buttocks Pain Descriptors / Indicators: Grimacing, Guarding, Discomfort, Sore Pain Intervention(s): Monitored during session, Premedicated before session, Limited activity within patient's tolerance, Repositioned  Home Living                                          Prior Functioning/Environment              Frequency  Min 2X/week        Progress Toward Goals  OT Goals(current goals can now be found in the care plan section)  Progress towards OT goals: Progressing toward goals  Acute Rehab OT Goals OT Goal Formulation: With  patient/family Time For Goal Achievement: 04/16/24 Potential to Achieve Goals: Fair  Plan      Co-evaluation                 AM-PAC OT "6 Clicks" Daily Activity     Outcome Measure   Help from another person eating meals?: A Little Help from another person taking care of personal grooming?: A Little Help from another person toileting, which includes using toliet, bedpan, or urinal?: Total Help from another person bathing (including washing, rinsing, drying)?: A Lot Help from another person to put on and taking off regular upper body clothing?: A Lot Help from another person to put on and taking off regular lower body clothing?: Total 6 Click Score: 12    End of Session Equipment Utilized During Treatment: Other (comment);Gait belt (ROHO cushion, stedy)  OT Visit Diagnosis: Other abnormalities of gait and mobility (R26.89);Muscle weakness (  generalized) (M62.81);History of falling (Z91.81) Pain - Right/Left: Left Pain - part of body: Arm;Hand   Activity Tolerance Patient tolerated treatment well   Patient Left with call bell/phone within reach;in chair;with family/visitor present   Nurse Communication          Time: 8841-6606 OT Time Calculation (min): 33 min  Charges: OT General Charges $OT Visit: 1 Visit OT Treatments $Therapeutic Activity: 23-37 mins  04/10/2024  AB, OTR/L  Acute Rehabilitation Services  Office: [phone]   Jorene New 04/10/2024, 4:42 PM

## 2024-04-10 NOTE — Consult Note (Signed)
 WOC Nurse wound follow up Wound type: pressure injuries  Dressing procedure/placement/frequency:   1 - Left Hip PI stage 2 Dressing procedure/placement/frequency: Apply Xeroform, cover with foam dressing, change daily.   2 - Left shoulder DTPI Dressing procedure/placement/frequency: Apply Santyl  (change daily) and cover with foam dressing, change every 3 days or PRN.   3 - Sacral Unstageable PI Dressing procedure/placement/frequency: Cleanse with Vashe#[id number]. Apply Aquacel in  the wound bed and fill the tunneling using a swab (change daily) , cover with sacral foam dressing, change daily. Per patient's daughter, patient has irritation from xeroform on the sacral wound. Switched back to silver hydrofiber per her request. She would like foam dressing change daily and PRN to keep the wound "clean".  Per patient's daughter there has been some type of supply issues with the Vashe solution, I have requested the unit secretary to order two bottles.  Would suggest staff order 2 at each order when supply is running low   4 - Right heel Dressing procedure/placement/frequency: Protect with foam dressing, change every 3 days or PRN.   5/6 Right great toe: distal end 0.2 cm scabbed, skin intact. Left great toe 0.3 cm scabbed, skin intact. Dressing procedure/placement/frequency: Leave open air.  Low air loss mattress in place for moisture management and pressure redistribution  Dietician involved for wound supplementation  Prevalon boots in the room for offloading the heels. Unsure at the time of my visit why they are off of patient, will follow up with bedside nursing   Dr. Elsworth Halt in the room to assess patient and speak with daughter.   WOC Nurse team will follow with you and see patient within 10 days for wound assessments.  Please notify WOC nurses of any acute changes in the wounds or any new areas of concern Ayzia Day [facility] MSN, RN,CWOCN, CNS, CWON-AP [phone]

## 2024-04-10 NOTE — Plan of Care (Signed)
  Problem: Fluid Volume: Goal: Hemodynamic stability will improve Outcome: Progressing   Problem: Clinical Measurements: Goal: Diagnostic test results will improve Outcome: Progressing Goal: Signs and symptoms of infection will decrease Outcome: Progressing   Problem: Respiratory: Goal: Ability to maintain adequate ventilation will improve Outcome: Progressing   Problem: Education: Goal: Knowledge of risk factors and measures for prevention of condition will improve Outcome: Progressing   Problem: Coping: Goal: Psychosocial and spiritual needs will be supported Outcome: Progressing   Problem: Respiratory: Goal: Will maintain a patent airway Outcome: Progressing Goal: Complications related to the disease process, condition or treatment will be avoided or minimized Outcome: Progressing   Problem: Education: Goal: Ability to describe self-care measures that may prevent or decrease complications (Diabetes Survival Skills Education) will improve Outcome: Progressing Goal: Individualized Educational Video(s) Outcome: Progressing   Problem: Coping: Goal: Ability to adjust to condition or change in health will improve Outcome: Progressing   Problem: Fluid Volume: Goal: Ability to maintain a balanced intake and output will improve Outcome: Progressing   Problem: Health Behavior/Discharge Planning: Goal: Ability to identify and utilize available resources and services will improve Outcome: Progressing Goal: Ability to manage health-related needs will improve Outcome: Progressing   Problem: Metabolic: Goal: Ability to maintain appropriate glucose levels will improve Outcome: Progressing   Problem: Nutritional: Goal: Maintenance of adequate nutrition will improve Outcome: Progressing Goal: Progress toward achieving an optimal weight will improve Outcome: Progressing   Problem: Skin Integrity: Goal: Risk for impaired skin integrity will decrease Outcome: Progressing    Problem: Tissue Perfusion: Goal: Adequacy of tissue perfusion will improve Outcome: Progressing   Problem: Education: Goal: Knowledge of General Education information will improve Description: Including pain rating scale, medication(s)/side effects and non-pharmacologic comfort measures Outcome: Progressing   Problem: Health Behavior/Discharge Planning: Goal: Ability to manage health-related needs will improve Outcome: Progressing   Problem: Clinical Measurements: Goal: Ability to maintain clinical measurements within normal limits will improve Outcome: Progressing Goal: Will remain free from infection Outcome: Progressing Goal: Diagnostic test results will improve Outcome: Progressing Goal: Respiratory complications will improve Outcome: Progressing Goal: Cardiovascular complication will be avoided Outcome: Progressing   Problem: Activity: Goal: Risk for activity intolerance will decrease Outcome: Progressing   Problem: Nutrition: Goal: Adequate nutrition will be maintained Outcome: Progressing   Problem: Coping: Goal: Level of anxiety will decrease Outcome: Progressing   Problem: Elimination: Goal: Will not experience complications related to bowel motility Outcome: Progressing Goal: Will not experience complications related to urinary retention Outcome: Progressing   Problem: Pain Managment: Goal: General experience of comfort will improve and/or be controlled Outcome: Progressing   Problem: Safety: Goal: Ability to remain free from injury will improve Outcome: Progressing   Problem: Skin Integrity: Goal: Risk for impaired skin integrity will decrease Outcome: Progressing

## 2024-04-11 DIAGNOSIS — R6520 Severe sepsis without septic shock: Secondary | ICD-10-CM | POA: Diagnosis not present

## 2024-04-11 DIAGNOSIS — A419 Sepsis, unspecified organism: Secondary | ICD-10-CM | POA: Diagnosis not present

## 2024-04-11 LAB — GLUCOSE, CAPILLARY
Glucose-Capillary: 95 mg/dL (ref 70–99)
Glucose-Capillary: 189 mg/dL — ABNORMAL HIGH (ref 70–99)
Glucose-Capillary: 168 mg/dL — ABNORMAL HIGH (ref 70–99)
Glucose-Capillary: 119 mg/dL — ABNORMAL HIGH (ref 70–99)

## 2024-04-11 NOTE — Progress Notes (Signed)
 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  Critical care was consulted for persistent hypotension.  Respiratory panel was negative.  General surgery was consulted for sacral wound debridement with bedside excision on 03/05/2024 and 03/07/2024.  He is now hemodynamically stable for discharge.    Assessment & Plan:   Principal Problem:   Severe sepsis (HCC) Active Problems:   Paroxysmal atrial fibrillation (HCC)   Sepsis (HCC)   Atrial flutter (HCC)   Sacral ulcer (HCC)   MGUS (monoclonal gammopathy of unknown significance)   Essential hypertension   Hyperlipidemia   Insulin  dependent type 2 diabetes mellitus (HCC)   History of CVA (cerebrovascular accident)   Cardiac amyloidosis (HCC)   Chronic systolic CHF (congestive heart failure) (HCC)   GERD (gastroesophageal reflux disease)   History of hypertension   Atrial fibrillation with RVR (HCC)   Community acquired pneumonia   Protein-calorie malnutrition, severe   Severe sepsis secondary to 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 He presented with pain and hypotension on admission. CT A/P shows sacral decubitus ulcer extending to the right gluteus muscle with no evidence of osteomyelitis. Seen by PCCM,  started empirically on antibiotics which he has completed his course on  03/21/2024. Surgery now signed off - He underwent bedside excisional debridement on 03/05/2024 and 03/07/2024. ID signed off 4/22, he's now completed antibiotics.  "Mainstay will be wound care and nutritional support". Patient is now medically stable to be transferred to skilled nursing facility.   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 Should expect months to years for complete healing. S/p excisional debridement on 4/15, 4/17 General surgery signed off on 4/21 (note from [employer], PA-C) Currently on no overlay mattress. Turn every 2 hours . Encourage out of bed to chair, try if possible to set him up in the chair daily. Recently eating 100% of meals. Ensure 3 times daily. Nutrition has been consulted.   History of paroxysmal atrial fibrillation (HCC) Continue amiodarone  and Eliquis . Cardiology has signed off.  Remains on telemetry due to the his daughter's preference.   History of HFrEF/ Cardiac amyloidosis /Nonischemic cardiomyopathy: Entresto  and metoprolol  held on admission. Cardiology recommended to follow-up. Blood pressure seems to be stable off Entresto  and metoprolol . Now on lasix , midodrine  for hypotension (holding parameters) On tafamadis for amyloid. Last note from cards was 5/2.  Appears they signed off on 4/21 [facility]).   Insulin -dependent diabetes mellitus type 2: HbA1c 8.4. Continue long-acting insulin  plus sliding scale. BG's appropriate   GERD: Stable.   Anemia of chronic disease: S/p 1 unit pRBC 4/30 Last Hb 10.2   Hypothyroidism: Continue Synthroid .   Constipation: Started on MiraLAX  p.o. twice daily.   Stage 4 sacral decubitus ulcer present on admission Last seen by WOC team on 03/28/24 Appreciate wound care (see above and orders for details). Wound care team  has met with the daughter at bedside.    Dysphagia: Severe Protein Calorie  Malnutrition Prealbumin of less than 5.  Nutrition consult appreciated. They have recommended: 1 packet of Juven bid to support wound healing. Magic Cup tid with meals. Mighty shake tid with meals Ensure enlive Po TID. Vitamin C  500 mg bid. MVI daily. Patient on a pured diet albumin  appears to be improving.   Weakness  Debility  MRI 3/27 without acute infarct. Lower extremity weakness>upper (? Related to chronic bed bound status/LE wound/pain?)  Continue PT/OT    Left lower lobe pneumonia: Completed course of antibiotics. Resolved.   DVT prophylaxis: Lovenox  Code Status: DNR Family Communication: Daughter at bed side Disposition Plan:    Status is: Inpatient Remains inpatient appropriate because: Severity of illness.   Consultants:  Wound care  Procedures: None  Antimicrobials: Anti-infectives (From admission, onward)    Start     Dose/Rate Route Frequency Ordered Stop   03/16/24 1015  cefTRIAXone  (ROCEPHIN ) 2 g in sodium chloride  0.9 % 100 mL IVPB        2 g 200 mL/hr over 30 Minutes Intravenous Every 24 hours 03/16/24 0929 03/20/24 1219   03/16/24 1015  doxycycline  (VIBRA -TABS) tablet 100 mg        100 mg Oral Every 12 hours 03/16/24 0929 03/20/24 2131   03/14/24 1000  metroNIDAZOLE  (FLAGYL ) tablet 500 mg        500 mg Oral Every 12 hours 03/14/24 0803 03/19/24 2219   03/13/24 1000  cefadroxil  (DURICEF) capsule 1,000 mg  Status:  Discontinued        1,000 mg Oral 2 times daily 03/12/24 1632 03/16/24 0929   03/07/24 2200  metroNIDAZOLE  (FLAGYL ) tablet 500 mg        500 mg Oral Every 12 hours 03/07/24 0840 03/11/24 2258   03/05/24 1930  vancomycin  (VANCOREADY) IVPB 1250 mg/250 mL  Status:  Discontinued        1,250 mg 166.7 mL/hr over 90 Minutes Intravenous Every 24 hours 03/04/24 2205 03/04/24 2224   03/05/24 1930  Vancomycin  (VANCOCIN ) 1,250 mg in sodium chloride  0.9 % 250 mL IVPB  Status:  Discontinued        1,250 mg 166.7 mL/hr over 90 Minutes Intravenous  Every 24 hours 03/04/24 2224 03/05/24 0717   03/05/24 1930  vancomycin  (VANCOREADY) IVPB 1500 mg/300 mL  Status:  Discontinued        1,500 mg 150 mL/hr over 120 Minutes Intravenous Every 24 hours 03/05/24 0717 03/12/24 1636   03/05/24 1400  ceFEPIme  (MAXIPIME ) 2 g in sodium chloride  0.9 % 100 mL IVPB  Status:  Discontinued        2 g 200 mL/hr over 30 Minutes Intravenous Every 8 hours 03/05/24 0717 03/12/24 1632   03/05/24 0600  ceFEPIme  (MAXIPIME ) 2 g in sodium chloride  0.9 % 100 mL IVPB  Status:  Discontinued        2 g 200 mL/hr over 30 Minutes Intravenous Every 12 hours 03/04/24 2205 03/05/24 0717   03/05/24 0000  metroNIDAZOLE  (FLAGYL ) IVPB 500 mg  Status:  Discontinued        500 mg 100 mL/hr over 60 Minutes Intravenous Every 12 hours 03/04/24 2206 03/04/24 2209   03/04/24 1730  aztreonam (AZACTAM) 2 g in sodium chloride  0.9 % 100 mL IVPB  Status:  Discontinued        2 g 200 mL/hr over 30 Minutes Intravenous  Once 03/04/24 1715 03/04/24 1724   03/04/24 1730  metroNIDAZOLE  (FLAGYL ) IVPB 500 mg  Status:  Discontinued        500 mg 100 mL/hr over 60 Minutes Intravenous Every 12 hours 03/04/24 1715 03/07/24 0840   03/04/24 1730  vancomycin  (VANCOREADY) IVPB 1500 mg/300 mL        1,500 mg 150 mL/hr over 120 Minutes Intravenous  Once 03/04/24 1715 03/04/24 2153   03/04/24 1730  ceFEPIme  (MAXIPIME ) 2 g in sodium chloride  0.9 % 100 mL IVPB        2 g 200 mL/hr over 30 Minutes Intravenous  Once 03/04/24 1724 03/04/24 1826      Subjective: Patient was seen and examined at bedside.Overnight events noted. Patient reports feeling better,  he reports hurting in his buttocks.  Objective: Vitals:   04/10/24 2048 04/10/24 2350 04/11/24 0549 04/11/24 0749  BP: 117/64 110/68 125/66 121/80  Pulse: 77 73 87 78  Resp: 16 16 16 17   Temp: 98.3 F (36.8 C) 98.4 F (36.9 C)  (!) 97.4 F (36.3 C)  TempSrc:    Oral  SpO2: 100% 100% 97% 100%  Weight:      Height:        Intake/Output  Summary (Last 24 hours) at 04/11/2024 1142 Last data filed at 04/11/2024 0750 Gross per 24 hour  Intake --  Output 1600 ml  Net -1600 ml   Filed Weights   03/04/24 1656 03/15/24 0500  Weight: 68.5 kg 75.3 kg    Examination:  General exam: Appears calm and comfortable, deconditioned, not in any acute distress. Respiratory system: CTA Bilaterally. Respiratory effort normal.  RR 15 Cardiovascular system: S1 & S2 heard, RRR. No JVD, murmurs, rubs, gallops or clicks.  Gastrointestinal system: Abdomen is non distended, soft and non tender. Normal bowel sounds heard. Central nervous system: Alert and oriented x 3. No focal neurological deficits. Extremities: No edema, no cyanosis, no clubbing. Skin: Decubitus ulcer stage III-IV Psychiatry: Judgement and insight appear normal. Mood & affect appropriate.     Data Reviewed: I have personally reviewed following labs and imaging studies  CBC: Recent Labs  Lab 04/07/24 1015 04/10/24 0552  WBC 6.6 6.1  NEUTROABS 4.1  --   HGB 9.5* 10.2*  HCT 29.5* 31.0*  MCV 88.3 86.8  PLT 373 340   Basic Metabolic Panel: Recent Labs  Lab 04/07/24 1015 04/10/24 0552  NA 138 140  K 4.2 3.6  CL 105 108  CO2 24 21*  GLUCOSE 206* 109*  BUN 25* 24*  CREATININE 0.89 0.84  CALCIUM  8.1* 8.0*  MG  --  1.8  PHOS  --  4.2   GFR: Estimated Creatinine Clearance: 72.2 mL/min (by C-G formula based on SCr of 0.84 mg/dL). Liver Function Tests: No results for input(s): "AST", "ALT", "ALKPHOS", "BILITOT", "PROT", "ALBUMIN " in the last 168 hours. No results for input(s): "LIPASE", "AMYLASE" in the last 168 hours. No results for input(s): "AMMONIA" in the last 168 hours. Coagulation Profile: No results for input(s): "INR", "PROTIME" in the last 168 hours. Cardiac Enzymes: No results for input(s): "CKTOTAL", "CKMB", "CKMBINDEX", "TROPONINI" in the last 168 hours. BNP (last 3 results) No results for input(s): "PROBNP" in the last 8760 hours. HbA1C: No  results for input(s): "HGBA1C" in the last 72 hours. CBG: Recent Labs  Lab 04/10/24 0754 04/10/24 1307 04/10/24 1724 04/10/24 2050 04/11/24 0746  GLUCAP 120* 228* 165* 183* 95   Lipid Profile: No results for input(s): "CHOL", "HDL", "LDLCALC", "TRIG", "CHOLHDL", "LDLDIRECT" in the last 72 hours. Thyroid  Function Tests: No  results for input(s): "TSH", "T4TOTAL", "FREET4", "T3FREE", "THYROIDAB" in the last 72 hours. Anemia Panel: No results for input(s): "VITAMINB12", "FOLATE", "FERRITIN", "TIBC", "IRON", "RETICCTPCT" in the last 72 hours. Sepsis Labs: No results for input(s): "PROCALCITON", "LATICACIDVEN" in the last 168 hours.  No results found for this or any previous visit (from the past 240 hours).   Radiology Studies: No results found.  Scheduled Meds:  acetaminophen   500 mg Oral TID   amiodarone   200 mg Oral Daily   apixaban   5 mg Oral BID   atorvastatin   40 mg Oral Daily   Chlorhexidine  Gluconate Cloth  6 each Topical Daily   collagenase    Topical Daily   diclofenac  Sodium  4 g Topical QID   feeding supplement  237 mL Oral TID BM   ferrous sulfate   325 mg Oral QHS   furosemide   20 mg Oral Daily   guaiFENesin   600 mg Oral BID   insulin  aspart  0-15 Units Subcutaneous TID WC   insulin  aspart  0-5 Units Subcutaneous QHS   insulin  aspart  4 Units Subcutaneous TID WC   insulin  glargine-yfgn  7 Units Subcutaneous Daily   levothyroxine   50 mcg Oral Q0600   liver oil-zinc  oxide   Topical BID   midodrine   5 mg Oral TID WC   multivitamin with minerals  1 tablet Oral Daily   nutrition supplement (JUVEN)  1 packet Oral BID BM   mouth rinse  15 mL Mouth Rinse 4 times per day   oxyCODONE   10 mg Oral Q12H   phosphorus  500 mg Oral TID   polyethylene glycol  17 g Oral BID   senna-docusate  1 tablet Oral QHS   sodium chloride  flush  3 mL Intravenous Q12H   Tafamidis   1 capsule Oral Daily   Continuous Infusions:   LOS: 38 days    Time spent: 35 mins    Magdalene School, MD [facility]   If 7PM-7AM, please contact night-coverage

## 2024-04-11 NOTE — Progress Notes (Signed)
 Physical Therapy Treatment Patient Details Name: Earl [name] MRN: [id number] DOB: 1941/06/08 Today's Date: 04/11/2024   History of Present Illness 83 y.o. male admitted from [facility] rehab on 03/04/24 with SOB, sacral pain. Workup for acute metabolic encephalopathy, rhabdomyolysis, sepsis secondary to sacral wound infection, afib with RVR. S/p bedside sacral wound debridement 4/15 and 4/17. Of note, recent admit 3/26-4/08/2024 with fall, AMS, COVID; d/c to SNF rehab. Other PMH includes CVA (2021, residual R-side weakness), DM2, HTN, CHF, MGUS, afib on Eliquis .    PT Comments  Patient eager to work with PT. Remains very weak and requires Max assist for rolling (with use of rails), +2 mod assist to come to sit EOB, and +2 mod assist to raise hips off bed toward goal of standing. He remains painful at sites of wounds and bil knees, however pushes himself to do as much as he can. Very slow progress with continued PT warranted.     If plan is discharge home, recommend the following: Two people to help with walking and/or transfers;Two people to help with bathing/dressing/bathroom;Assistance with feeding;Assist for transportation   Can travel by private vehicle     No  Equipment Recommendations  Hospital bed;Hoyer lift (air mattress due to wounds)    Recommendations for Other Services       Precautions / Restrictions Precautions Precautions: Fall;Other (comment) Recall of Precautions/Restrictions: Intact Precaution/Restrictions Comments: significant pain from sacral wound; b/b incontinence Restrictions Weight Bearing Restrictions Per Provider Order: No     Mobility  Bed Mobility Overal bed mobility: Needs Assistance Bed Mobility: Rolling, Sidelying to Sit, Sit to Sidelying Rolling: Max assist, Used rails Sidelying to sit: Used rails, +2 for physical assistance, Mod assist     Sit to sidelying: Max assist, +2 for physical assistance General bed mobility comments: rolling  prior to sitting EOB for pericare and donning incontinence brief; Max A+2 to assist with RLE repositioning during bed mobility and to raise trunk into upright position. At times pt with strong R lean, almost resistive and needing cues to encourage more weight shifting to L side. Slight posterior lean in sitting with air mattress deflated to reduce risk of falling. Rolling again at end of session to replace bed pad    Transfers Overall transfer level: Needs assistance Equipment used:  (steady unavailable, used back of recliner) Transfers: Sit to/from Stand, Bed to chair/wheelchair/BSC Sit to Stand: Mod assist, +2 physical assistance           General transfer comment: stedy not available; from elevated eOB with pt holding onto handles on back of recliner attempted sit to stand x 4 with pt barely able to clear buttocks off the bed    Ambulation/Gait                   Stairs             Wheelchair Mobility     Tilt Bed    Modified Rankin (Stroke Patients Only)       Balance Overall balance assessment: Needs assistance Sitting-balance support: Bilateral upper extremity supported, Feet supported Sitting balance-Leahy Scale: Poor   Postural control: Right lateral lean, Posterior lean                                  Communication Communication Communication: Impaired Factors Affecting Communication: Reduced clarity of speech  Cognition Arousal: Alert Behavior During Therapy: [facility] for tasks assessed/performed  PT - Cognitive impairments: Problem solving, Memory, Initiation, Sequencing                       PT - Cognition Comments: Pt pleasantly cooperative, RN had premedicated him according to discussion earlier in the day and pt following simple commands well as he is able. Some slow processing. Following commands: Impaired Following commands impaired: Follows one step commands with increased time    Cueing Cueing Techniques: Verbal  cues, Tactile cues, Gestural cues  Exercises General Exercises - Lower Extremity Ankle Circles/Pumps: AROM, Both, 10 reps, Supine Heel Slides: AAROM, Both, 5 reps, Supine Hip ABduction/ADduction: AAROM, Both, 5 reps, Supine (cues to move his legs while pt rolling in supine) Straight Leg Raises: AAROM, Both, 5 reps    General Comments        Pertinent Vitals/Pain Pain Assessment Pain Assessment: Faces Faces Pain Scale: Hurts whole lot Pain Location: sacral area, L hip, rt knee Pain Descriptors / Indicators: Grimacing, Guarding, Discomfort, Sore, Moaning Pain Intervention(s): Limited activity within patient's tolerance, Monitored during session, Premedicated before session, Repositioned    Home Living                          Prior Function            PT Goals (current goals can now be found in the care plan section) Acute Rehab PT Goals Patient Stated Goal: To improve strength and mobility to reduce risk for falls. PT Goal Formulation: With patient Time For Goal Achievement: 04/25/24 Potential to Achieve Goals: Fair Progress towards PT goals: Goals updated    Frequency    Min 1X/week      PT Plan      Co-evaluation              AM-PAC PT "6 Clicks" Mobility   Outcome Measure  Help needed turning from your back to your side while in a flat bed without using bedrails?: Total Help needed moving from lying on your back to sitting on the side of a flat bed without using bedrails?: Total Help needed moving to and from a bed to a chair (including a wheelchair)?: Total Help needed standing up from a chair using your arms (e.g., wheelchair or bedside chair)?: Total Help needed to walk in hospital room?: Total Help needed climbing 3-5 steps with a railing? : Total 6 Click Score: 6    End of Session Equipment Utilized During Treatment: Gait belt;Other (comment) (+ bed pad assist) Activity Tolerance: Patient tolerated treatment well Patient left: in  bed;with call bell/phone within reach   PT Visit Diagnosis: Muscle weakness (generalized) (M62.81);Adult, failure to thrive (R62.7);Difficulty in walking, not elsewhere classified (R26.2);Pain Pain - part of body: Knee (knees, bottom, lower back)     Time: 1610-9604 PT Time Calculation (min) (ACUTE ONLY): 47 min  Charges:    $Therapeutic Exercise: 8-22 mins $Therapeutic Activity: 23-37 mins PT General Charges $$ ACUTE PT VISIT: 1 Visit                      Gayle Kava, PT Acute Rehabilitation Services  Office [phone]    Guilford Leep 04/11/2024, 12:51 PM

## 2024-04-11 NOTE — Plan of Care (Signed)
  Problem: Fluid Volume: Goal: Hemodynamic stability will improve Outcome: Progressing   Problem: Clinical Measurements: Goal: Diagnostic test results will improve Outcome: Progressing Goal: Signs and symptoms of infection will decrease Outcome: Progressing   Problem: Respiratory: Goal: Ability to maintain adequate ventilation will improve Outcome: Progressing   Problem: Education: Goal: Knowledge of risk factors and measures for prevention of condition will improve Outcome: Progressing   Problem: Coping: Goal: Psychosocial and spiritual needs will be supported Outcome: Progressing   Problem: Respiratory: Goal: Will maintain a patent airway Outcome: Progressing Goal: Complications related to the disease process, condition or treatment will be avoided or minimized Outcome: Progressing   Problem: Education: Goal: Ability to describe self-care measures that may prevent or decrease complications (Diabetes Survival Skills Education) will improve Outcome: Progressing Goal: Individualized Educational Video(s) Outcome: Progressing   Problem: Coping: Goal: Ability to adjust to condition or change in health will improve Outcome: Progressing   Problem: Fluid Volume: Goal: Ability to maintain a balanced intake and output will improve Outcome: Progressing   Problem: Health Behavior/Discharge Planning: Goal: Ability to identify and utilize available resources and services will improve Outcome: Progressing Goal: Ability to manage health-related needs will improve Outcome: Progressing   Problem: Metabolic: Goal: Ability to maintain appropriate glucose levels will improve Outcome: Progressing   Problem: Nutritional: Goal: Maintenance of adequate nutrition will improve Outcome: Progressing Goal: Progress toward achieving an optimal weight will improve Outcome: Progressing   Problem: Skin Integrity: Goal: Risk for impaired skin integrity will decrease Outcome: Progressing    Problem: Tissue Perfusion: Goal: Adequacy of tissue perfusion will improve Outcome: Progressing   Problem: Education: Goal: Knowledge of General Education information will improve Description: Including pain rating scale, medication(s)/side effects and non-pharmacologic comfort measures Outcome: Progressing   Problem: Health Behavior/Discharge Planning: Goal: Ability to manage health-related needs will improve Outcome: Progressing   Problem: Clinical Measurements: Goal: Ability to maintain clinical measurements within normal limits will improve Outcome: Progressing Goal: Will remain free from infection Outcome: Progressing Goal: Diagnostic test results will improve Outcome: Progressing Goal: Respiratory complications will improve Outcome: Progressing Goal: Cardiovascular complication will be avoided Outcome: Progressing   Problem: Activity: Goal: Risk for activity intolerance will decrease Outcome: Progressing   Problem: Nutrition: Goal: Adequate nutrition will be maintained Outcome: Progressing   Problem: Coping: Goal: Level of anxiety will decrease Outcome: Progressing   Problem: Elimination: Goal: Will not experience complications related to bowel motility Outcome: Progressing Goal: Will not experience complications related to urinary retention Outcome: Progressing   Problem: Pain Managment: Goal: General experience of comfort will improve and/or be controlled Outcome: Progressing   Problem: Safety: Goal: Ability to remain free from injury will improve Outcome: Progressing   Problem: Skin Integrity: Goal: Risk for impaired skin integrity will decrease Outcome: Progressing

## 2024-04-11 NOTE — Progress Notes (Signed)
 Pt has refused his Prevalon boots today.

## 2024-04-12 DIAGNOSIS — A419 Sepsis, unspecified organism: Secondary | ICD-10-CM | POA: Diagnosis not present

## 2024-04-12 DIAGNOSIS — R6520 Severe sepsis without septic shock: Secondary | ICD-10-CM | POA: Diagnosis not present

## 2024-04-12 LAB — GLUCOSE, CAPILLARY
Glucose-Capillary: 80 mg/dL (ref 70–99)
Glucose-Capillary: 80 mg/dL (ref 70–99)
Glucose-Capillary: 209 mg/dL — ABNORMAL HIGH (ref 70–99)
Glucose-Capillary: 175 mg/dL — ABNORMAL HIGH (ref 70–99)
Glucose-Capillary: 76 mg/dL (ref 70–99)

## 2024-04-12 NOTE — Progress Notes (Signed)
 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  Critical care was consulted for persistent hypotension.  Respiratory panel was negative.  General surgery was consulted for sacral wound debridement with bedside excision on 03/05/2024 and 03/07/2024.  He is now hemodynamically stable for discharge.    Assessment & Plan:   Principal Problem:   Severe sepsis (HCC) Active Problems:   Paroxysmal atrial fibrillation (HCC)   Sepsis (HCC)   Atrial flutter (HCC)   Sacral ulcer (HCC)   MGUS (monoclonal gammopathy of unknown significance)   Essential hypertension   Hyperlipidemia   Insulin  dependent type 2 diabetes mellitus (HCC)   History of CVA (cerebrovascular accident)   Cardiac amyloidosis (HCC)   Chronic systolic CHF (congestive heart failure) (HCC)   GERD (gastroesophageal reflux disease)   History of hypertension   Atrial fibrillation with RVR (HCC)   Community acquired pneumonia   Protein-calorie malnutrition, severe   Severe sepsis secondary to 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 He presented with pain and hypotension on admission. CT A/P shows sacral decubitus ulcer extending to the right gluteus muscle with no evidence of osteomyelitis. Seen by PCCM,  started empirically on antibiotics which he has completed his course on  03/21/2024. Surgery now signed off - He underwent bedside excisional debridement on 03/05/2024 and 03/07/2024. ID signed off 4/22, he's now completed antibiotics.  "Mainstay will be wound care and nutritional support".  Patient is now medically stable to be transferred to skilled nursing facility.   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 Should expect months to years for complete healing. S/p excisional debridement on 4/15, 4/17 General surgery signed off on 4/21 (note from [employer], PA-C) Currently on no overlay mattress. Turn every 2 hours . Encourage out of bed to chair, try if possible to set him up in the chair daily. Recently eating 100% of meals. Ensure 3 times daily. Nutrition has been consulted.   History of paroxysmal atrial fibrillation (HCC) Continue amiodarone  and Eliquis . Cardiology has signed off.  Remains on telemetry due to the his daughter's preference.   History of HFrEF/ Cardiac amyloidosis /Nonischemic cardiomyopathy: Entresto  and metoprolol  held on admission. Cardiology recommended to follow-up. Blood pressure seems to be stable off Entresto  and metoprolol . Now on lasix , midodrine  for hypotension (holding parameters) On tafamadis for amyloid. Last note from cards was 5/2.  Appears they signed off on 4/21 [facility]).   Insulin -dependent diabetes mellitus type 2: HbA1c 8.4. Continue long-acting insulin  plus sliding scale. BG's appropriate   GERD: Stable.   Anemia of chronic disease: S/p 1 unit pRBC 4/30 Last Hb 10.2   Hypothyroidism: Continue Synthroid .   Constipation: Started on MiraLAX  p.o. twice daily.   Stage 4 sacral decubitus ulcer present on admission Last seen by WOC team on 03/28/24 Appreciate wound care (see above and orders for details). Wound care  team has met with the daughter at bedside.    Dysphagia: Severe Protein Calorie  Malnutrition Prealbumin of less than 5.  Nutrition consult appreciated. They have recommended: 1 packet of Juven bid to support wound healing. Magic Cup tid with meals. Mighty shake tid with meals Ensure enlive Po TID. Vitamin C  500 mg bid. MVI daily. Patient on a pured diet albumin  appears to be improving.   Weakness  Debility  MRI 3/27 without acute infarct. Lower extremity weakness>upper (? Related to chronic bed bound status/LE wound/pain?)  Continue PT/OT    Left lower lobe pneumonia: Completed course of antibiotics. Resolved.   DVT prophylaxis: Lovenox  Code Status: DNR Family Communication: Daughter at bed side Disposition Plan:    Status is: Inpatient Remains inpatient appropriate because: Severity of illness.   Consultants:  Wound care  Procedures: None  Antimicrobials: Anti-infectives (From admission, onward)    Start     Dose/Rate Route Frequency Ordered Stop   03/16/24 1015  cefTRIAXone  (ROCEPHIN ) 2 g in sodium chloride  0.9 % 100 mL IVPB        2 g 200 mL/hr over 30 Minutes Intravenous Every 24 hours 03/16/24 0929 03/20/24 1219   03/16/24 1015  doxycycline  (VIBRA -TABS) tablet 100 mg        100 mg Oral Every 12 hours 03/16/24 0929 03/20/24 2131   03/14/24 1000  metroNIDAZOLE  (FLAGYL ) tablet 500 mg        500 mg Oral Every 12 hours 03/14/24 0803 03/19/24 2219   03/13/24 1000  cefadroxil  (DURICEF) capsule 1,000 mg  Status:  Discontinued        1,000 mg Oral 2 times daily 03/12/24 1632 03/16/24 0929   03/07/24 2200  metroNIDAZOLE  (FLAGYL ) tablet 500 mg        500 mg Oral Every 12 hours 03/07/24 0840 03/11/24 2258   03/05/24 1930  vancomycin  (VANCOREADY) IVPB 1250 mg/250 mL  Status:  Discontinued        1,250 mg 166.7 mL/hr over 90 Minutes Intravenous Every 24 hours 03/04/24 2205 03/04/24 2224   03/05/24 1930  Vancomycin  (VANCOCIN ) 1,250 mg in sodium chloride  0.9 % 250 mL IVPB  Status:  Discontinued        1,250 mg 166.7 mL/hr over 90 Minutes Intravenous  Every 24 hours 03/04/24 2224 03/05/24 0717   03/05/24 1930  vancomycin  (VANCOREADY) IVPB 1500 mg/300 mL  Status:  Discontinued        1,500 mg 150 mL/hr over 120 Minutes Intravenous Every 24 hours 03/05/24 0717 03/12/24 1636   03/05/24 1400  ceFEPIme  (MAXIPIME ) 2 g in sodium chloride  0.9 % 100 mL IVPB  Status:  Discontinued        2 g 200 mL/hr over 30 Minutes Intravenous Every 8 hours 03/05/24 0717 03/12/24 1632   03/05/24 0600  ceFEPIme  (MAXIPIME ) 2 g in sodium chloride  0.9 % 100 mL IVPB  Status:  Discontinued        2 g 200 mL/hr over 30 Minutes Intravenous Every 12 hours 03/04/24 2205 03/05/24 0717   03/05/24 0000  metroNIDAZOLE  (FLAGYL ) IVPB 500 mg  Status:  Discontinued        500 mg 100 mL/hr over 60 Minutes Intravenous Every 12 hours 03/04/24 2206 03/04/24 2209   03/04/24 1730  aztreonam (AZACTAM) 2 g in sodium chloride  0.9 % 100 mL IVPB  Status:  Discontinued        2 g 200 mL/hr over 30 Minutes Intravenous  Once 03/04/24 1715 03/04/24 1724   03/04/24 1730  metroNIDAZOLE  (FLAGYL ) IVPB 500 mg  Status:  Discontinued        500 mg 100 mL/hr over 60 Minutes Intravenous Every 12 hours 03/04/24 1715 03/07/24 0840   03/04/24 1730  vancomycin  (VANCOREADY) IVPB 1500 mg/300 mL        1,500 mg 150 mL/hr over 120 Minutes Intravenous  Once 03/04/24 1715 03/04/24 2153   03/04/24 1730  ceFEPIme  (MAXIPIME ) 2 g in sodium chloride  0.9 % 100 mL IVPB        2 g 200 mL/hr over 30 Minutes Intravenous  Once 03/04/24 1724 03/04/24 1826      Subjective: Patient was seen and examined at bedside.Overnight events noted. Patient reports feeling better,  he denies any other concerns.   Objective: Vitals:   04/11/24 2022 04/12/24 0018 04/12/24 0420 04/12/24 0815  BP: 120/78 124/74 125/64 124/76  Pulse: 75 76 73 77  Resp: 16 16 16    Temp: 98 F (36.7 C) 98.2 F (36.8 C) 97.8 F (36.6 C) 97.8 F (36.6 C)  TempSrc:      SpO2: 100% 100% 100% 100%  Weight:      Height:        Intake/Output  Summary (Last 24 hours) at 04/12/2024 1149 Last data filed at 04/12/2024 0021 Gross per 24 hour  Intake 400 ml  Output 1180 ml  Net -780 ml   Filed Weights   03/04/24 1656 03/15/24 0500  Weight: 68.5 kg 75.3 kg    Examination:  General exam: Appears calm and comfortable, deconditioned, not in any acute distress. Respiratory system: CTA Bilaterally. Respiratory effort normal.  RR 14 Cardiovascular system: S1 & S2 heard, RRR. No JVD, murmurs, rubs, gallops or clicks.  Gastrointestinal system: Abdomen is non distended, soft and non tender. Normal bowel sounds heard. Central nervous system: Alert and oriented x 3. No focal neurological deficits. Extremities: No edema, no cyanosis, no clubbing. Skin: Decubitus ulcer stage III-IV Psychiatry: Judgement and insight appear normal. Mood & affect appropriate.    Data Reviewed: I have personally reviewed following labs and imaging studies  CBC: Recent Labs  Lab 04/07/24 1015 04/10/24 0552  WBC 6.6 6.1  NEUTROABS 4.1  --   HGB 9.5* 10.2*  HCT 29.5* 31.0*  MCV 88.3 86.8  PLT 373 340   Basic Metabolic Panel: Recent Labs  Lab 04/07/24 1015 04/10/24 0552  NA 138 140  K 4.2 3.6  CL 105 108  CO2 24 21*  GLUCOSE 206* 109*  BUN 25* 24*  CREATININE 0.89 0.84  CALCIUM  8.1* 8.0*  MG  --  1.8  PHOS  --  4.2   GFR: Estimated Creatinine Clearance: 72.2 mL/min (by C-G formula based on SCr of 0.84 mg/dL). Liver Function Tests: No results for input(s): "AST", "ALT", "ALKPHOS", "BILITOT", "PROT", "ALBUMIN " in the last 168 hours. No results for input(s): "LIPASE", "AMYLASE" in the last 168 hours. No results for input(s): "AMMONIA" in the last 168 hours. Coagulation Profile: No results for input(s): "INR", "PROTIME" in the last 168 hours. Cardiac Enzymes: No results for input(s): "CKTOTAL", "CKMB", "CKMBINDEX", "TROPONINI" in the last 168 hours. BNP (last 3 results) No results for input(s): "PROBNP" in the last 8760 hours. HbA1C: No  results for input(s): "HGBA1C" in the last 72 hours. CBG: Recent Labs  Lab 04/11/24 1230 04/11/24 1644 04/11/24 2024 04/12/24 0726 04/12/24 0817  GLUCAP 189* 168* 119* 80 80   Lipid Profile: No results for input(s): "CHOL", "HDL", "LDLCALC", "TRIG", "CHOLHDL", "LDLDIRECT" in the last 72 hours. Thyroid  Function  Tests: No results for input(s): "TSH", "T4TOTAL", "FREET4", "T3FREE", "THYROIDAB" in the last 72 hours. Anemia Panel: No results for input(s): "VITAMINB12", "FOLATE", "FERRITIN", "TIBC", "IRON", "RETICCTPCT" in the last 72 hours. Sepsis Labs: No results for input(s): "PROCALCITON", "LATICACIDVEN" in the last 168 hours.  No results found for this or any previous visit (from the past 240 hours).   Radiology Studies: No results found.  Scheduled Meds:  acetaminophen   500 mg Oral TID   amiodarone   200 mg Oral Daily   apixaban   5 mg Oral BID   atorvastatin   40 mg Oral Daily   Chlorhexidine  Gluconate Cloth  6 each Topical Daily   collagenase    Topical Daily   diclofenac  Sodium  4 g Topical QID   feeding supplement  237 mL Oral TID BM   ferrous sulfate   325 mg Oral QHS   furosemide   20 mg Oral Daily   guaiFENesin   600 mg Oral BID   insulin  aspart  0-15 Units Subcutaneous TID WC   insulin  aspart  0-5 Units Subcutaneous QHS   insulin  aspart  4 Units Subcutaneous TID WC   insulin  glargine-yfgn  7 Units Subcutaneous Daily   levothyroxine   50 mcg Oral Q0600   liver oil-zinc  oxide   Topical BID   midodrine   5 mg Oral TID WC   multivitamin with minerals  1 tablet Oral Daily   nutrition supplement (JUVEN)  1 packet Oral BID BM   mouth rinse  15 mL Mouth Rinse 4 times per day   oxyCODONE   10 mg Oral Q12H   phosphorus  500 mg Oral TID   polyethylene glycol  17 g Oral BID   senna-docusate  1 tablet Oral QHS   sodium chloride  flush  3 mL Intravenous Q12H   Tafamidis   1 capsule Oral Daily   Continuous Infusions:   LOS: 39 days    Time spent: 35 mins    Magdalene School,  MD [facility]   If 7PM-7AM, please contact night-coverage

## 2024-04-12 NOTE — Plan of Care (Signed)
  Problem: Fluid Volume: Goal: Hemodynamic stability will improve Outcome: Progressing   Problem: Clinical Measurements: Goal: Diagnostic test results will improve Outcome: Progressing Goal: Signs and symptoms of infection will decrease Outcome: Progressing   Problem: Respiratory: Goal: Ability to maintain adequate ventilation will improve Outcome: Progressing   Problem: Education: Goal: Knowledge of risk factors and measures for prevention of condition will improve Outcome: Progressing   Problem: Coping: Goal: Psychosocial and spiritual needs will be supported Outcome: Progressing   Problem: Respiratory: Goal: Will maintain a patent airway Outcome: Progressing Goal: Complications related to the disease process, condition or treatment will be avoided or minimized Outcome: Progressing   Problem: Education: Goal: Ability to describe self-care measures that may prevent or decrease complications (Diabetes Survival Skills Education) will improve Outcome: Progressing Goal: Individualized Educational Video(s) Outcome: Progressing   Problem: Coping: Goal: Ability to adjust to condition or change in health will improve Outcome: Progressing   Problem: Fluid Volume: Goal: Ability to maintain a balanced intake and output will improve Outcome: Progressing   Problem: Health Behavior/Discharge Planning: Goal: Ability to identify and utilize available resources and services will improve Outcome: Progressing Goal: Ability to manage health-related needs will improve Outcome: Progressing   Problem: Metabolic: Goal: Ability to maintain appropriate glucose levels will improve Outcome: Progressing   Problem: Nutritional: Goal: Maintenance of adequate nutrition will improve Outcome: Progressing Goal: Progress toward achieving an optimal weight will improve Outcome: Progressing   Problem: Skin Integrity: Goal: Risk for impaired skin integrity will decrease Outcome: Progressing    Problem: Tissue Perfusion: Goal: Adequacy of tissue perfusion will improve Outcome: Progressing   Problem: Education: Goal: Knowledge of General Education information will improve Description: Including pain rating scale, medication(s)/side effects and non-pharmacologic comfort measures Outcome: Progressing   Problem: Health Behavior/Discharge Planning: Goal: Ability to manage health-related needs will improve Outcome: Progressing   Problem: Clinical Measurements: Goal: Ability to maintain clinical measurements within normal limits will improve Outcome: Progressing Goal: Will remain free from infection Outcome: Progressing Goal: Diagnostic test results will improve Outcome: Progressing Goal: Respiratory complications will improve Outcome: Progressing Goal: Cardiovascular complication will be avoided Outcome: Progressing   Problem: Activity: Goal: Risk for activity intolerance will decrease Outcome: Progressing   Problem: Nutrition: Goal: Adequate nutrition will be maintained Outcome: Progressing   Problem: Coping: Goal: Level of anxiety will decrease Outcome: Progressing   Problem: Elimination: Goal: Will not experience complications related to bowel motility Outcome: Progressing Goal: Will not experience complications related to urinary retention Outcome: Progressing   Problem: Pain Managment: Goal: General experience of comfort will improve and/or be controlled Outcome: Progressing   Problem: Safety: Goal: Ability to remain free from injury will improve Outcome: Progressing   Problem: Skin Integrity: Goal: Risk for impaired skin integrity will decrease Outcome: Progressing

## 2024-04-12 NOTE — Progress Notes (Signed)
 Speech Language Pathology Treatment: Dysphagia  Patient Details Name: Earl [name] MRN: [id number] DOB: [birth year] Today's Date: 04/12/2024 Time: 6213-0865 SLP Time Calculation (min) (ACUTE ONLY): 10 min  Assessment / Plan / Recommendation Clinical Impression  [name] was interactive and participatory.  He is tolerating a pureed diet /thin liquids well and evidenced no difficulty with swallowing or concerns for aspiration during today's session.  Breath sounds are clear/diminished. No chest imaging has been necessary in the last two weeks.  When asked if he wanted to advance beyond pureed foods, he politely declined. SLP will f/u when family is present to determine ability/desire to try POs that may be more appetitizing.     HPI HPI: Patient is an 83 y.o. 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 He was discharged from the hospital (admitted for acute metabolic encephalopathy, hypotensive, hypoglycemic) to [facility] for rehab. He presented back to the hospital four days later on 03/04/24 with SOB and pain in sacral area. 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 SLP ordered due to suspicious for dysphagia.      SLP Plan  Continue with current plan of care      Recommendations for follow up therapy are one component of a multi-disciplinary discharge planning process, led by the attending physician.  Recommendations may be updated based on patient status, additional functional criteria and insurance authorization.    Recommendations  Diet recommendations: Thin liquid;Dysphagia 1 (puree) Liquids provided via: Cup;Straw Medication Administration: Crushed with puree Supervision: Trained caregiver to feed patient Postural Changes and/or  Swallow Maneuvers: Seated upright 90 degrees                  Oral care BID   Frequent or constant Supervision/Assistance Dysphagia, unspecified (R13.10)     Continue with current plan of care    Isabel Ardila L. Beatris Lincoln, MA CCC/SLP Clinical Specialist - Acute Care SLP Acute Rehabilitation Services Office number [phone]  [name]  04/12/2024, 2:09 PM

## 2024-04-12 NOTE — Plan of Care (Signed)
   Problem: Fluid Volume: Goal: Hemodynamic stability will improve Outcome: Progressing   Problem: Clinical Measurements: Goal: Diagnostic test results will improve Outcome: Progressing Goal: Signs and symptoms of infection will decrease Outcome: Progressing   Problem: Respiratory: Goal: Ability to maintain adequate ventilation will improve Outcome: Progressing

## 2024-04-13 DIAGNOSIS — A419 Sepsis, unspecified organism: Secondary | ICD-10-CM | POA: Diagnosis not present

## 2024-04-13 DIAGNOSIS — R6520 Severe sepsis without septic shock: Secondary | ICD-10-CM | POA: Diagnosis not present

## 2024-04-13 LAB — GLUCOSE, CAPILLARY
Glucose-Capillary: 63 mg/dL — ABNORMAL LOW (ref 70–99)
Glucose-Capillary: 112 mg/dL — ABNORMAL HIGH (ref 70–99)
Glucose-Capillary: 191 mg/dL — ABNORMAL HIGH (ref 70–99)
Glucose-Capillary: 125 mg/dL — ABNORMAL HIGH (ref 70–99)
Glucose-Capillary: 132 mg/dL — ABNORMAL HIGH (ref 70–99)

## 2024-04-13 NOTE — Progress Notes (Signed)
 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  Critical care was consulted for persistent hypotension.  Respiratory panel was negative.  General surgery was consulted for sacral wound debridement with bedside excision on 03/05/2024 and 03/07/2024.  He is now hemodynamically stable for discharge.    Assessment & Plan:   Principal Problem:   Severe sepsis (HCC) Active Problems:   Paroxysmal atrial fibrillation (HCC)   Sepsis (HCC)   Atrial flutter (HCC)   Sacral ulcer (HCC)   MGUS (monoclonal gammopathy of unknown significance)   Essential hypertension   Hyperlipidemia   Insulin  dependent type 2 diabetes mellitus (HCC)   History of CVA (cerebrovascular accident)   Cardiac amyloidosis (HCC)   Chronic systolic CHF (congestive heart failure) (HCC)   GERD (gastroesophageal reflux disease)   History of hypertension   Atrial fibrillation with RVR (HCC)   Community acquired pneumonia   Protein-calorie malnutrition, severe   Severe sepsis secondary to 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 He presented with pain and hypotension on admission. CT A/P shows sacral decubitus ulcer extending to the right gluteus muscle with no evidence of osteomyelitis. Seen by PCCM,  started empirically on antibiotics which he has completed his course on  03/21/2024. Surgery now signed off - He underwent bedside excisional debridement on 03/05/2024 and 03/07/2024. ID signed off 4/22, he's now completed antibiotics.  "Mainstay will be wound care and nutritional support".  Patient is now medically stable to be transferred to skilled nursing facility.   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 Should expect months to years for complete healing. S/p excisional debridement on 4/15, 4/17 General surgery signed off on 4/21 (note from [employer], PA-C) Currently on no overlay mattress. Turn every 2 hours . Encourage out of bed to chair, try if possible to set him up in the chair daily. Recently eating 100% of meals. Ensure 3 times daily. Nutrition has been consulted.   History of paroxysmal atrial fibrillation (HCC) Continue amiodarone  and Eliquis . Cardiology has signed off.  Remains on telemetry due to the his daughter's preference.   History of HFrEF/ Cardiac amyloidosis /Nonischemic cardiomyopathy: Entresto  and metoprolol  held on admission. Cardiology recommended to follow-up. Blood pressure seems to be stable off Entresto  and metoprolol . Now on lasix , midodrine  for hypotension (holding parameters) On tafamadis for amyloid. Last note from cards was 5/2.  Appears they signed off on 4/21 [facility]).   Insulin -dependent diabetes mellitus type 2: HbA1c 8.4. Continue long-acting insulin  plus sliding scale. BG's appropriate   GERD: Stable.   Anemia of chronic disease: S/p 1 unit pRBC 4/30 Last Hb 10.2   Hypothyroidism: Continue Synthroid .   Constipation: Started on MiraLAX  p.o. twice daily.   Stage 4 sacral decubitus ulcer present on admission Last seen by WOC team on 03/28/24 Appreciate wound care (see above and orders for details). Wound care  team has met with the daughter at bedside.    Dysphagia: Severe Protein Calorie  Malnutrition Prealbumin of less than 5.  Nutrition consult appreciated. They have recommended: 1 packet of Juven bid to support wound healing. Magic Cup tid with meals. Mighty shake tid with meals Ensure enlive Po TID. Vitamin C  500 mg bid. MVI daily. Patient on a pured diet albumin  appears to be improving.   Weakness  Debility  MRI 3/27 without acute infarct. Lower extremity weakness>upper (? Related to chronic bed bound status/LE wound/pain?)  Continue PT/OT    Left lower lobe pneumonia: Completed course of antibiotics. Resolved.   DVT prophylaxis: Lovenox  Code Status: DNR Family Communication: Daughter at bed side Disposition Plan:    Status is: Inpatient Remains inpatient appropriate because: Severity of illness.   Consultants:  Wound care  Procedures: None  Antimicrobials: Anti-infectives (From admission, onward)    Start     Dose/Rate Route Frequency Ordered Stop   03/16/24 1015  cefTRIAXone  (ROCEPHIN ) 2 g in sodium chloride  0.9 % 100 mL IVPB        2 g 200 mL/hr over 30 Minutes Intravenous Every 24 hours 03/16/24 0929 03/20/24 1219   03/16/24 1015  doxycycline  (VIBRA -TABS) tablet 100 mg        100 mg Oral Every 12 hours 03/16/24 0929 03/20/24 2131   03/14/24 1000  metroNIDAZOLE  (FLAGYL ) tablet 500 mg        500 mg Oral Every 12 hours 03/14/24 0803 03/19/24 2219   03/13/24 1000  cefadroxil  (DURICEF) capsule 1,000 mg  Status:  Discontinued        1,000 mg Oral 2 times daily 03/12/24 1632 03/16/24 0929   03/07/24 2200  metroNIDAZOLE  (FLAGYL ) tablet 500 mg        500 mg Oral Every 12 hours 03/07/24 0840 03/11/24 2258   03/05/24 1930  vancomycin  (VANCOREADY) IVPB 1250 mg/250 mL  Status:  Discontinued        1,250 mg 166.7 mL/hr over 90 Minutes Intravenous Every 24 hours 03/04/24 2205 03/04/24 2224   03/05/24 1930  Vancomycin  (VANCOCIN ) 1,250 mg in sodium chloride  0.9 % 250 mL IVPB  Status:  Discontinued        1,250 mg 166.7 mL/hr over 90 Minutes Intravenous  Every 24 hours 03/04/24 2224 03/05/24 0717   03/05/24 1930  vancomycin  (VANCOREADY) IVPB 1500 mg/300 mL  Status:  Discontinued        1,500 mg 150 mL/hr over 120 Minutes Intravenous Every 24 hours 03/05/24 0717 03/12/24 1636   03/05/24 1400  ceFEPIme  (MAXIPIME ) 2 g in sodium chloride  0.9 % 100 mL IVPB  Status:  Discontinued        2 g 200 mL/hr over 30 Minutes Intravenous Every 8 hours 03/05/24 0717 03/12/24 1632   03/05/24 0600  ceFEPIme  (MAXIPIME ) 2 g in sodium chloride  0.9 % 100 mL IVPB  Status:  Discontinued        2 g 200 mL/hr over 30 Minutes Intravenous Every 12 hours 03/04/24 2205 03/05/24 0717   03/05/24 0000  metroNIDAZOLE  (FLAGYL ) IVPB 500 mg  Status:  Discontinued        500 mg 100 mL/hr over 60 Minutes Intravenous Every 12 hours 03/04/24 2206 03/04/24 2209   03/04/24 1730  aztreonam (AZACTAM) 2 g in sodium chloride  0.9 % 100 mL IVPB  Status:  Discontinued        2 g 200 mL/hr over 30 Minutes Intravenous  Once 03/04/24 1715 03/04/24 1724   03/04/24 1730  metroNIDAZOLE  (FLAGYL ) IVPB 500 mg  Status:  Discontinued        500 mg 100 mL/hr over 60 Minutes Intravenous Every 12 hours 03/04/24 1715 03/07/24 0840   03/04/24 1730  vancomycin  (VANCOREADY) IVPB 1500 mg/300 mL        1,500 mg 150 mL/hr over 120 Minutes Intravenous  Once 03/04/24 1715 03/04/24 2153   03/04/24 1730  ceFEPIme  (MAXIPIME ) 2 g in sodium chloride  0.9 % 100 mL IVPB        2 g 200 mL/hr over 30 Minutes Intravenous  Once 03/04/24 1724 03/04/24 1826      Subjective: Patient was seen and examined at bedside. Overnight events noted. Patient reports feeling dizzy earlier in the morning, having breakfast.  Daughter at bedside.   Objective: Vitals:   04/13/24 0018 04/13/24 0443 04/13/24 0445 04/13/24 0805  BP: 108/63 116/72 116/72 130/68  Pulse: 72 72 72 74  Resp: 20 18 18    Temp: (!) 97.3 F (36.3 C) 98 F (36.7 C) 98 F (36.7 C) (!) 97.5 F (36.4 C)  TempSrc: Oral Oral Oral   SpO2: 100% 99% 99% 100%   Weight:      Height:        Intake/Output Summary (Last 24 hours) at 04/13/2024 1128 Last data filed at 04/13/2024 0446 Gross per 24 hour  Intake --  Output 800 ml  Net -800 ml   Filed Weights   03/04/24 1656 03/15/24 0500  Weight: 68.5 kg 75.3 kg    Examination:  General exam: Appears comfortable, deconditioned, not in any acute distress. Respiratory system: CTA Bilaterally. Respiratory effort normal.  RR 14 Cardiovascular system: S1 & S2 heard, RRR. No JVD, murmurs, rubs, gallops or clicks.  Gastrointestinal system: Abdomen is non distended, soft and non tender. Normal bowel sounds heard. Central nervous system: Alert and oriented x 3. No focal neurological deficits. Extremities: No edema, no cyanosis, no clubbing. Skin: Decubitus ulcer stage III-IV Psychiatry: Judgement and insight appear normal. Mood & affect appropriate.    Data Reviewed: I have personally reviewed following labs and imaging studies  CBC: Recent Labs  Lab 04/07/24 1015 04/10/24 0552  WBC 6.6 6.1  NEUTROABS 4.1  --   HGB 9.5* 10.2*  HCT 29.5* 31.0*  MCV 88.3 86.8  PLT 373 340   Basic Metabolic Panel: Recent Labs  Lab 04/07/24 1015 04/10/24 0552  NA 138 140  K 4.2 3.6  CL 105 108  CO2 24 21*  GLUCOSE 206* 109*  BUN 25* 24*  CREATININE 0.89 0.84  CALCIUM  8.1* 8.0*  MG  --  1.8  PHOS  --  4.2   GFR: Estimated Creatinine Clearance: 72.2 mL/min (by C-G formula based on SCr of 0.84 mg/dL). Liver Function Tests: No results for input(s): "AST", "ALT", "ALKPHOS", "BILITOT", "PROT", "ALBUMIN " in the last 168 hours. No results for input(s): "LIPASE", "AMYLASE" in the last 168 hours. No results for input(s): "AMMONIA" in the last 168 hours. Coagulation Profile: No results for input(s): "INR", "PROTIME" in the last 168 hours. Cardiac Enzymes: No results for input(s): "CKTOTAL", "CKMB", "CKMBINDEX", "TROPONINI" in the last 168 hours. BNP (last 3 results) No results for input(s): "PROBNP" in  the last 8760 hours. HbA1C: No results for input(s): "HGBA1C" in the last 72 hours. CBG: Recent Labs  Lab 04/12/24 1206 04/12/24 1647 04/12/24 1932 04/13/24 0805 04/13/24 0941  GLUCAP 209* 175* 76 63* 112*   Lipid Profile: No results for input(s): "CHOL", "HDL", "LDLCALC", "TRIG", "CHOLHDL", "LDLDIRECT" in the last  72 hours. Thyroid  Function Tests: No results for input(s): "TSH", "T4TOTAL", "FREET4", "T3FREE", "THYROIDAB" in the last 72 hours. Anemia Panel: No results for input(s): "VITAMINB12", "FOLATE", "FERRITIN", "TIBC", "IRON", "RETICCTPCT" in the last 72 hours. Sepsis Labs: No results for input(s): "PROCALCITON", "LATICACIDVEN" in the last 168 hours.  No results found for this or any previous visit (from the past 240 hours).   Radiology Studies: No results found.  Scheduled Meds:  acetaminophen   500 mg Oral TID   amiodarone   200 mg Oral Daily   apixaban   5 mg Oral BID   atorvastatin   40 mg Oral Daily   Chlorhexidine  Gluconate Cloth  6 each Topical Daily   collagenase    Topical Daily   diclofenac  Sodium  4 g Topical QID   feeding supplement  237 mL Oral TID BM   ferrous sulfate   325 mg Oral QHS   furosemide   20 mg Oral Daily   guaiFENesin   600 mg Oral BID   insulin  aspart  0-15 Units Subcutaneous TID WC   insulin  aspart  0-5 Units Subcutaneous QHS   insulin  aspart  4 Units Subcutaneous TID WC   insulin  glargine-yfgn  7 Units Subcutaneous Daily   levothyroxine   50 mcg Oral Q0600   liver oil-zinc  oxide   Topical BID   midodrine   5 mg Oral TID WC   multivitamin with minerals  1 tablet Oral Daily   nutrition supplement (JUVEN)  1 packet Oral BID BM   mouth rinse  15 mL Mouth Rinse 4 times per day   oxyCODONE   10 mg Oral Q12H   phosphorus  500 mg Oral TID   polyethylene glycol  17 g Oral BID   senna-docusate  1 tablet Oral QHS   sodium chloride  flush  3 mL Intravenous Q12H   Tafamidis   1 capsule Oral Daily   Continuous Infusions:   LOS: 40 days    Time  spent: 35 mins    Magdalene School, MD [facility]   If 7PM-7AM, please contact night-coverage

## 2024-04-13 NOTE — Plan of Care (Signed)
  Problem: Fluid Volume: Goal: Hemodynamic stability will improve Outcome: Progressing   Problem: Clinical Measurements: Goal: Signs and symptoms of infection will decrease Outcome: Progressing   Problem: Respiratory: Goal: Ability to maintain adequate ventilation will improve Outcome: Progressing   Problem: Respiratory: Goal: Will maintain a patent airway Outcome: Progressing   Problem: Fluid Volume: Goal: Ability to maintain a balanced intake and output will improve Outcome: Progressing   Problem: Health Behavior/Discharge Planning: Goal: Ability to manage health-related needs will improve Outcome: Progressing   Problem: Metabolic: Goal: Ability to maintain appropriate glucose levels will improve Outcome: Progressing   Problem: Nutritional: Goal: Maintenance of adequate nutrition will improve Outcome: Progressing   Problem: Tissue Perfusion: Goal: Adequacy of tissue perfusion will improve Outcome: Progressing   Problem: Clinical Measurements: Goal: Ability to maintain clinical measurements within normal limits will improve Outcome: Progressing Goal: Diagnostic test results will improve Outcome: Progressing Goal: Respiratory complications will improve Outcome: Progressing Goal: Cardiovascular complication will be avoided Outcome: Progressing   Problem: Nutrition: Goal: Adequate nutrition will be maintained Outcome: Progressing   Problem: Elimination: Goal: Will not experience complications related to bowel motility Outcome: Progressing Goal: Will not experience complications related to urinary retention Outcome: Progressing   Problem: Pain Managment: Goal: General experience of comfort will improve and/or be controlled Outcome: Progressing   Problem: Safety: Goal: Ability to remain free from injury will improve Outcome: Progressing

## 2024-04-13 NOTE — Plan of Care (Signed)
  Problem: Fluid Volume: Goal: Hemodynamic stability will improve Outcome: Progressing   Problem: Clinical Measurements: Goal: Diagnostic test results will improve Outcome: Progressing Goal: Signs and symptoms of infection will decrease Outcome: Progressing   Problem: Respiratory: Goal: Ability to maintain adequate ventilation will improve Outcome: Progressing   Problem: Education: Goal: Knowledge of risk factors and measures for prevention of condition will improve Outcome: Progressing   Problem: Coping: Goal: Psychosocial and spiritual needs will be supported Outcome: Progressing   Problem: Respiratory: Goal: Will maintain a patent airway Outcome: Progressing Goal: Complications related to the disease process, condition or treatment will be avoided or minimized Outcome: Progressing   Problem: Education: Goal: Ability to describe self-care measures that may prevent or decrease complications (Diabetes Survival Skills Education) will improve Outcome: Progressing Goal: Individualized Educational Video(s) Outcome: Progressing   Problem: Coping: Goal: Ability to adjust to condition or change in health will improve Outcome: Progressing   Problem: Fluid Volume: Goal: Ability to maintain a balanced intake and output will improve Outcome: Progressing   Problem: Health Behavior/Discharge Planning: Goal: Ability to identify and utilize available resources and services will improve Outcome: Progressing Goal: Ability to manage health-related needs will improve Outcome: Progressing   Problem: Metabolic: Goal: Ability to maintain appropriate glucose levels will improve Outcome: Progressing   Problem: Nutritional: Goal: Maintenance of adequate nutrition will improve Outcome: Progressing Goal: Progress toward achieving an optimal weight will improve Outcome: Progressing   Problem: Skin Integrity: Goal: Risk for impaired skin integrity will decrease Outcome: Progressing    Problem: Tissue Perfusion: Goal: Adequacy of tissue perfusion will improve Outcome: Progressing   Problem: Education: Goal: Knowledge of General Education information will improve Description: Including pain rating scale, medication(s)/side effects and non-pharmacologic comfort measures Outcome: Progressing   Problem: Health Behavior/Discharge Planning: Goal: Ability to manage health-related needs will improve Outcome: Progressing   Problem: Clinical Measurements: Goal: Ability to maintain clinical measurements within normal limits will improve Outcome: Progressing Goal: Will remain free from infection Outcome: Progressing Goal: Diagnostic test results will improve Outcome: Progressing Goal: Respiratory complications will improve Outcome: Progressing Goal: Cardiovascular complication will be avoided Outcome: Progressing   Problem: Activity: Goal: Risk for activity intolerance will decrease Outcome: Progressing   Problem: Nutrition: Goal: Adequate nutrition will be maintained Outcome: Progressing   Problem: Coping: Goal: Level of anxiety will decrease Outcome: Progressing   Problem: Elimination: Goal: Will not experience complications related to bowel motility Outcome: Progressing Goal: Will not experience complications related to urinary retention Outcome: Progressing   Problem: Pain Managment: Goal: General experience of comfort will improve and/or be controlled Outcome: Progressing   Problem: Safety: Goal: Ability to remain free from injury will improve Outcome: Progressing   Problem: Skin Integrity: Goal: Risk for impaired skin integrity will decrease Outcome: Progressing

## 2024-04-14 DIAGNOSIS — A419 Sepsis, unspecified organism: Secondary | ICD-10-CM | POA: Diagnosis not present

## 2024-04-14 DIAGNOSIS — R6520 Severe sepsis without septic shock: Secondary | ICD-10-CM | POA: Diagnosis not present

## 2024-04-14 LAB — CBC
HCT: 30.6 % — ABNORMAL LOW (ref 39.0–52.0)
Hemoglobin: 9.9 g/dL — ABNORMAL LOW (ref 13.0–17.0)
MCH: 28.2 pg (ref 26.0–34.0)
MCHC: 32.4 g/dL (ref 30.0–36.0)
MCV: 87.2 fL (ref 80.0–100.0)
Platelets: 287 10*3/uL (ref 150–400)
RBC: 3.51 MIL/uL — ABNORMAL LOW (ref 4.22–5.81)
RDW: 16.3 % — ABNORMAL HIGH (ref 11.5–15.5)
WBC: 6 10*3/uL (ref 4.0–10.5)
nRBC: 0 % (ref 0.0–0.2)

## 2024-04-14 LAB — GLUCOSE, CAPILLARY
Glucose-Capillary: 107 mg/dL — ABNORMAL HIGH (ref 70–99)
Glucose-Capillary: 117 mg/dL — ABNORMAL HIGH (ref 70–99)
Glucose-Capillary: 88 mg/dL (ref 70–99)
Glucose-Capillary: 152 mg/dL — ABNORMAL HIGH (ref 70–99)

## 2024-04-14 LAB — BASIC METABOLIC PANEL WITH GFR
Anion gap: 8 (ref 5–15)
BUN: 27 mg/dL — ABNORMAL HIGH (ref 8–23)
CO2: 26 mmol/L (ref 22–32)
Calcium: 8 mg/dL — ABNORMAL LOW (ref 8.9–10.3)
Chloride: 105 mmol/L (ref 98–111)
Creatinine, Ser: 0.86 mg/dL (ref 0.61–1.24)
GFR, Estimated: >60 mL/min (ref >60)
Glucose, Bld: 96 mg/dL (ref 70–99)
Potassium: 3.7 mmol/L (ref 3.5–5.1)
Sodium: 139 mmol/L (ref 135–145)

## 2024-04-14 NOTE — Progress Notes (Signed)
 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  Critical care was consulted for persistent hypotension.  Respiratory panel was negative.  General surgery was consulted for sacral wound debridement with bedside excision on 03/05/2024 and 03/07/2024.  He is now hemodynamically stable for discharge.    Assessment & Plan:   Principal Problem:   Severe sepsis (HCC) Active Problems:   Paroxysmal atrial fibrillation (HCC)   Sepsis (HCC)   Atrial flutter (HCC)   Sacral ulcer (HCC)   MGUS (monoclonal gammopathy of unknown significance)   Essential hypertension   Hyperlipidemia   Insulin  dependent type 2 diabetes mellitus (HCC)   History of CVA (cerebrovascular accident)   Cardiac amyloidosis (HCC)   Chronic systolic CHF (congestive heart failure) (HCC)   GERD (gastroesophageal reflux disease)   History of hypertension   Atrial fibrillation with RVR (HCC)   Community acquired pneumonia   Protein-calorie malnutrition, severe   Severe sepsis secondary to 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 He presented with pain and hypotension on admission. CT A/P shows sacral decubitus ulcer extending to the right gluteus muscle with no evidence of osteomyelitis. Seen by PCCM,  started empirically on antibiotics which he has completed his course on  03/21/2024. Surgery now signed off - He underwent bedside excisional debridement on 03/05/2024 and 03/07/2024. ID signed off 4/22, he's now completed antibiotics.  "Mainstay will be wound care and nutritional support". Patient is now medically stable to be transferred to skilled nursing facility.   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 Should expect months to years for complete healing. S/p excisional debridement on 4/15, 4/17 General surgery signed off on 4/21 (note from [employer], PA-C) Currently on no overlay mattress. Turn every 2 hours . Encourage out of bed to chair, try if possible to set him up in the chair daily. Recently eating 100% of meals. Ensure 3 times daily. Nutrition has been consulted.   History of paroxysmal atrial fibrillation (HCC) Continue amiodarone  and Eliquis . Cardiology has signed off.  Remains on telemetry due to the his daughter's preference.   History of HFrEF/ Cardiac amyloidosis /Nonischemic cardiomyopathy: Entresto  and metoprolol  held on admission. Cardiology recommended to follow-up. Blood pressure seems to be stable off Entresto  and metoprolol . Now on lasix , midodrine  for hypotension (holding parameters) On tafamadis for amyloid. Last note from cards was 5/2.  Appears they signed off on 4/21 [facility]).   Insulin -dependent diabetes mellitus type 2: HbA1c 8.4. Continue long-acting insulin  plus sliding scale. BG's appropriate   GERD: Stable.   Anemia of chronic disease: S/p 1 unit pRBC 4/30 Last Hb 10.2   Hypothyroidism: Continue Synthroid .   Constipation: Started on MiraLAX  p.o. twice daily.   Stage 4 sacral decubitus ulcer present on admission Last seen by WOC team on 03/28/24 Appreciate wound care (see above and orders for details). Wound care team  has met with the daughter at bedside.    Dysphagia: Severe Protein Calorie  Malnutrition Prealbumin of less than 5.  Nutrition consult appreciated. They have recommended: 1 packet of Juven bid to support wound healing. Magic Cup tid with meals. Mighty shake tid with meals Ensure enlive Po TID. Vitamin C  500 mg bid. MVI daily. Patient on a pured diet albumin  appears to be improving.   Weakness  Debility  MRI 3/27 without acute infarct. Lower extremity weakness>upper (? Related to chronic bed bound status/LE wound/pain?)  Continue PT/OT    Left lower lobe pneumonia: Completed course of antibiotics. Resolved.   DVT prophylaxis: Lovenox  Code Status: DNR Family Communication: Daughter at bed side Disposition Plan:    Status is: Inpatient Remains inpatient appropriate because: Severity of illness.   Consultants:  Wound care  Procedures: None  Antimicrobials: Anti-infectives (From admission, onward)    Start     Dose/Rate Route Frequency Ordered Stop   03/16/24 1015  cefTRIAXone  (ROCEPHIN ) 2 g in sodium chloride  0.9 % 100 mL IVPB        2 g 200 mL/hr over 30 Minutes Intravenous Every 24 hours 03/16/24 0929 03/20/24 1219   03/16/24 1015  doxycycline  (VIBRA -TABS) tablet 100 mg        100 mg Oral Every 12 hours 03/16/24 0929 03/20/24 2131   03/14/24 1000  metroNIDAZOLE  (FLAGYL ) tablet 500 mg        500 mg Oral Every 12 hours 03/14/24 0803 03/19/24 2219   03/13/24 1000  cefadroxil  (DURICEF) capsule 1,000 mg  Status:  Discontinued        1,000 mg Oral 2 times daily 03/12/24 1632 03/16/24 0929   03/07/24 2200  metroNIDAZOLE  (FLAGYL ) tablet 500 mg        500 mg Oral Every 12 hours 03/07/24 0840 03/11/24 2258   03/05/24 1930  vancomycin  (VANCOREADY) IVPB 1250 mg/250 mL  Status:  Discontinued        1,250 mg 166.7 mL/hr over 90 Minutes Intravenous Every 24 hours 03/04/24 2205 03/04/24 2224   03/05/24 1930  Vancomycin  (VANCOCIN ) 1,250 mg in sodium chloride  0.9 % 250 mL IVPB  Status:  Discontinued        1,250 mg 166.7 mL/hr over 90 Minutes Intravenous  Every 24 hours 03/04/24 2224 03/05/24 0717   03/05/24 1930  vancomycin  (VANCOREADY) IVPB 1500 mg/300 mL  Status:  Discontinued        1,500 mg 150 mL/hr over 120 Minutes Intravenous Every 24 hours 03/05/24 0717 03/12/24 1636   03/05/24 1400  ceFEPIme  (MAXIPIME ) 2 g in sodium chloride  0.9 % 100 mL IVPB  Status:  Discontinued        2 g 200 mL/hr over 30 Minutes Intravenous Every 8 hours 03/05/24 0717 03/12/24 1632   03/05/24 0600  ceFEPIme  (MAXIPIME ) 2 g in sodium chloride  0.9 % 100 mL IVPB  Status:  Discontinued        2 g 200 mL/hr over 30 Minutes Intravenous Every 12 hours 03/04/24 2205 03/05/24 0717   03/05/24 0000  metroNIDAZOLE  (FLAGYL ) IVPB 500 mg  Status:  Discontinued        500 mg 100 mL/hr over 60 Minutes Intravenous Every 12 hours 03/04/24 2206 03/04/24 2209   03/04/24 1730  aztreonam (AZACTAM) 2 g in sodium chloride  0.9 % 100 mL IVPB  Status:  Discontinued        2 g 200 mL/hr over 30 Minutes Intravenous  Once 03/04/24 1715 03/04/24 1724   03/04/24 1730  metroNIDAZOLE  (FLAGYL ) IVPB 500 mg  Status:  Discontinued        500 mg 100 mL/hr over 60 Minutes Intravenous Every 12 hours 03/04/24 1715 03/07/24 0840   03/04/24 1730  vancomycin  (VANCOREADY) IVPB 1500 mg/300 mL        1,500 mg 150 mL/hr over 120 Minutes Intravenous  Once 03/04/24 1715 03/04/24 2153   03/04/24 1730  ceFEPIme  (MAXIPIME ) 2 g in sodium chloride  0.9 % 100 mL IVPB        2 g 200 mL/hr over 30 Minutes Intravenous  Once 03/04/24 1724 03/04/24 1826      Subjective: Patient was seen and examined at bedside. Overnight events noted. Patient reports feeling better,  dizziness has improved.   He has been using Desitin in the groin area for some rash.    Objective: Vitals:   04/13/24 1930 04/14/24 0002 04/14/24 0451 04/14/24 0813  BP: 110/60 115/66 114/72 113/67  Pulse: 72 75 76 77  Resp: 20 19 19 18   Temp: 98.6 F (37 C) 100.2 F (37.9 C) 97.6 F (36.4 C) 97.7 F (36.5 C)  TempSrc: Oral Oral  Oral   SpO2: 100% 100% 100% 100%  Weight:      Height:        Intake/Output Summary (Last 24 hours) at 04/14/2024 1016 Last data filed at 04/14/2024 0500 Gross per 24 hour  Intake 240 ml  Output 1500 ml  Net -1260 ml   Filed Weights   03/04/24 1656 03/15/24 0500  Weight: 68.5 kg 75.3 kg    Examination:  General exam: Appears calm, comfortable, deconditioned, not in any acute distress. Respiratory system: CTA Bilaterally. Respiratory effort normal.  RR 14 Cardiovascular system: S1 & S2 heard, RRR. No JVD, murmurs, rubs, gallops or clicks.  Gastrointestinal system: Abdomen is non distended, soft and non tender. Normal bowel sounds heard. Central nervous system: Alert and oriented x 3. No focal neurological deficits. Extremities: No edema, no cyanosis, no clubbing. Skin: Decubitus ulcer stage III-IV Psychiatry: Judgement and insight appear normal. Mood & affect appropriate.    Data Reviewed: I have personally reviewed following labs and imaging studies  CBC: Recent Labs  Lab 04/10/24 0552 04/14/24 0737  WBC 6.1 6.0  HGB 10.2* 9.9*  HCT 31.0* 30.6*  MCV 86.8 87.2  PLT 340 287   Basic Metabolic Panel: Recent Labs  Lab 04/10/24 0552 04/14/24 0737  NA 140 139  K 3.6 3.7  CL 108 105  CO2 21* 26  GLUCOSE 109* 96  BUN 24* 27*  CREATININE 0.84 0.86  CALCIUM  8.0* 8.0*  MG 1.8  --   PHOS 4.2  --    GFR: Estimated Creatinine Clearance: 70.5 mL/min (by C-G formula based on SCr of 0.86 mg/dL). Liver Function Tests: No results for input(s): "AST", "ALT", "ALKPHOS", "BILITOT", "PROT", "ALBUMIN " in the last 168 hours. No results for input(s): "LIPASE", "AMYLASE" in the last 168 hours. No results for input(s): "AMMONIA" in the last 168 hours. Coagulation Profile: No results for input(s): "INR", "PROTIME" in the last 168 hours. Cardiac Enzymes: No results for input(s): "CKTOTAL", "CKMB", "CKMBINDEX", "TROPONINI" in the last 168 hours. BNP (last 3 results) No results for  input(s): "PROBNP" in the last 8760 hours. HbA1C: No results for input(s): "HGBA1C" in the last 72 hours. CBG: Recent Labs  Lab 04/13/24 0941 04/13/24 1245 04/13/24 1655 04/13/24 1930 04/14/24 0811  GLUCAP 112* 191* 125* 132* 107*   Lipid Profile: No results for input(s): "CHOL", "HDL", "LDLCALC", "TRIG", "CHOLHDL", "LDLDIRECT"  in the last 72 hours. Thyroid  Function Tests: No results for input(s): "TSH", "T4TOTAL", "FREET4", "T3FREE", "THYROIDAB" in the last 72 hours. Anemia Panel: No results for input(s): "VITAMINB12", "FOLATE", "FERRITIN", "TIBC", "IRON", "RETICCTPCT" in the last 72 hours. Sepsis Labs: No results for input(s): "PROCALCITON", "LATICACIDVEN" in the last 168 hours.  No results found for this or any previous visit (from the past 240 hours).   Radiology Studies: No results found.  Scheduled Meds:  acetaminophen   500 mg Oral TID   amiodarone   200 mg Oral Daily   apixaban   5 mg Oral BID   atorvastatin   40 mg Oral Daily   Chlorhexidine  Gluconate Cloth  6 each Topical Daily   collagenase    Topical Daily   diclofenac  Sodium  4 g Topical QID   feeding supplement  237 mL Oral TID BM   ferrous sulfate   325 mg Oral QHS   furosemide   20 mg Oral Daily   guaiFENesin   600 mg Oral BID   insulin  aspart  0-15 Units Subcutaneous TID WC   insulin  aspart  0-5 Units Subcutaneous QHS   insulin  aspart  4 Units Subcutaneous TID WC   insulin  glargine-yfgn  7 Units Subcutaneous Daily   levothyroxine   50 mcg Oral Q0600   liver oil-zinc  oxide   Topical BID   midodrine   5 mg Oral TID WC   multivitamin with minerals  1 tablet Oral Daily   nutrition supplement (JUVEN)  1 packet Oral BID BM   mouth rinse  15 mL Mouth Rinse 4 times per day   oxyCODONE   10 mg Oral Q12H   phosphorus  500 mg Oral TID   polyethylene glycol  17 g Oral BID   senna-docusate  1 tablet Oral QHS   sodium chloride  flush  3 mL Intravenous Q12H   Tafamidis   1 capsule Oral Daily   Continuous Infusions:    LOS: 41 days    Time spent: 35 mins    Magdalene School, MD [facility]   If 7PM-7AM, please contact night-coverage

## 2024-04-15 DIAGNOSIS — A419 Sepsis, unspecified organism: Secondary | ICD-10-CM | POA: Diagnosis not present

## 2024-04-15 DIAGNOSIS — R6520 Severe sepsis without septic shock: Secondary | ICD-10-CM | POA: Diagnosis not present

## 2024-04-15 LAB — GLUCOSE, CAPILLARY
Glucose-Capillary: 157 mg/dL — ABNORMAL HIGH (ref 70–99)
Glucose-Capillary: 195 mg/dL — ABNORMAL HIGH (ref 70–99)
Glucose-Capillary: 126 mg/dL — ABNORMAL HIGH (ref 70–99)
Glucose-Capillary: 104 mg/dL — ABNORMAL HIGH (ref 70–99)

## 2024-04-15 NOTE — Plan of Care (Signed)
  Problem: Fluid Volume: Goal: Hemodynamic stability will improve Outcome: Progressing   Problem: Clinical Measurements: Goal: Signs and symptoms of infection will decrease Outcome: Progressing   Problem: Education: Goal: Knowledge of risk factors and measures for prevention of condition will improve Outcome: Progressing   Problem: Coping: Goal: Psychosocial and spiritual needs will be supported Outcome: Progressing   Problem: Nutritional: Goal: Maintenance of adequate nutrition will improve Outcome: Progressing   Problem: Skin Integrity: Goal: Risk for impaired skin integrity will decrease Outcome: Progressing   Problem: Tissue Perfusion: Goal: Adequacy of tissue perfusion will improve Outcome: Progressing   Problem: Health Behavior/Discharge Planning: Goal: Ability to manage health-related needs will improve Outcome: Progressing     Problem: Fluid Volume: Goal: Ability to maintain a balanced intake and output will improve Outcome: Progressing

## 2024-04-15 NOTE — Progress Notes (Signed)
 Physical Therapy Treatment Patient Details Name: Earl [name] MRN: [id number] DOB: 1941/08/26 Today's Date: 04/15/2024   History of Present Illness 83 y.o. male admitted from [facility] rehab on 03/04/24 with SOB, sacral pain, Afib with RVR. Pt with acute metabolic encephalopathy, rhabdomyolysis and sepsis secondary to sacral wound infection. 4/15 & 4/17 bedside sacral wound debridement. PMhx: adm 3/26-4/08/2024 with fall, AMS, COVID; d/c to SNF rehab. CVA (2021, residual R-side weakness), T2DM, HTN, CHF, MGUS, Afib on Eliquis .    PT Comments  Pt very pleasant and willing to mobilize. Pt with tight knees, hips and right posterior lean coupled with his generalized weakness making mobility challenging even with +2 assist. Able to rise into the stedy and transfer to chair this session with maximove pad in place for return to bed and pt/daughter educated for <1hr in chair for pain control and educated for leg rest movement for change in pressure. Pt motivated and will continue to follow to attempt to progress function and strength.    If plan is discharge home, recommend the following: Two people to help with walking and/or transfers;Two people to help with bathing/dressing/bathroom;Assistance with feeding;Assist for transportation   Can travel by private vehicle     No  Equipment Recommendations  Hospital bed;Hoyer lift    Recommendations for Other Services       Precautions / Restrictions Precautions Precautions: Fall;Other (comment) Recall of Precautions/Restrictions: Intact Precaution/Restrictions Comments: significant pain from sacral wound; b/b incontinence     Mobility  Bed Mobility Overal bed mobility: Needs Assistance Bed Mobility: Supine to Sit     Supine to sit: Mod assist, Used rails, HOB elevated     General bed mobility comments: mod assist with increased time for pt to slide legs to eOB with min assist, mod assist to lift trunk to sitting and pivot fully to  EOB    Transfers Overall transfer level: Needs assistance   Transfers: Sit to/from Stand, Bed to chair/wheelchair/BSC Sit to Stand: Max assist, +2 physical assistance, From elevated surface, Via lift equipment           General transfer comment: max +2 assist to rise to standing from elevated bed to stedy with use of pad and belt to elevate hips and clear x 2 trials before able to clear onto stedy, assist to clear stedy pads to lower to chairl. Stedy for bed to chair Transfer via Lift Equipment: Stedy  Ambulation/Gait               General Gait Details: unable   Comptroller Bed    Modified Rankin (Stroke Patients Only)       Balance Overall balance assessment: Needs assistance Sitting-balance support: Bilateral upper extremity supported, Feet supported Sitting balance-Leahy Scale: Poor Sitting balance - Comments: posterior Rt lean in sitting, able to progress to brief periods of midline with UB support on stedy rail Postural control: Right lateral lean, Posterior lean Standing balance support: Bilateral upper extremity supported, Reliant on assistive device for balance Standing balance-Leahy Scale: Zero Standing balance comment: max +2 assist                            Communication Communication Communication: Impaired Factors Affecting Communication: Reduced clarity of speech  Cognition Arousal: Alert Behavior During Therapy: WFL for tasks assessed/performed   PT - Cognitive impairments: Problem solving, Memory,  Initiation, Sequencing                       PT - Cognition Comments: pt pleasant and willing, slow processing   Following commands impaired: Follows one step commands with increased time    Cueing Cueing Techniques: Verbal cues, Tactile cues, Gestural cues  Exercises General Exercises - Lower Extremity Short Arc Quad: AAROM, Both, 10 reps, Seated Hip Flexion/Marching: AAROM,  Both, 10 reps, Seated Other Exercises Other Exercises: pt using single UE and bil UE to pull forward in sitting x 5    General Comments        Pertinent Vitals/Pain Pain Assessment Pain Assessment: 0-10 Pain Score: 3  Pain Location: sacrum Pain Descriptors / Indicators: Aching Pain Intervention(s): Limited activity within patient's tolerance, Monitored during session, Premedicated before session, Repositioned    Home Living                          Prior Function            PT Goals (current goals can now be found in the care plan section) Progress towards PT goals: Progressing toward goals    Frequency    Min 1X/week      PT Plan      Co-evaluation              AM-PAC PT "6 Clicks" Mobility   Outcome Measure  Help needed turning from your back to your side while in a flat bed without using bedrails?: Total Help needed moving from lying on your back to sitting on the side of a flat bed without using bedrails?: Total Help needed moving to and from a bed to a chair (including a wheelchair)?: Total Help needed standing up from a chair using your arms (e.g., wheelchair or bedside chair)?: Total Help needed to walk in hospital room?: Total Help needed climbing 3-5 steps with a railing? : Total 6 Click Score: 6    End of Session Equipment Utilized During Treatment: Gait belt Activity Tolerance: Patient tolerated treatment well Patient left: in chair;with call bell/phone within reach;with family/visitor present Nurse Communication: Mobility status;Need for lift equipment PT Visit Diagnosis: Muscle weakness (generalized) (M62.81);Adult, failure to thrive (R62.7);Difficulty in walking, not elsewhere classified (R26.2);Pain;Other abnormalities of gait and mobility (R26.89)     Time: 4098-1191 PT Time Calculation (min) (ACUTE ONLY): 25 min  Charges:    $Therapeutic Exercise: 8-22 mins $Therapeutic Activity: 8-22 mins PT General Charges $$ ACUTE  PT VISIT: 1 Visit                     Annis Baseman, PT Acute Rehabilitation Services Office: [phone]    Jackey Mary Bre Pecina 04/15/2024, 11:46 AM

## 2024-04-15 NOTE — Progress Notes (Signed)
 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  Critical care was consulted for persistent hypotension.  Respiratory panel was negative.  General surgery was consulted for sacral wound debridement with bedside excision on 03/05/2024 and 03/07/2024.  He is now hemodynamically stable for discharge.    Assessment & Plan:   Principal Problem:   Severe sepsis (HCC) Active Problems:   Paroxysmal atrial fibrillation (HCC)   Sepsis (HCC)   Atrial flutter (HCC)   Sacral ulcer (HCC)   MGUS (monoclonal gammopathy of unknown significance)   Essential hypertension   Hyperlipidemia   Insulin  dependent type 2 diabetes mellitus (HCC)   History of CVA (cerebrovascular accident)   Cardiac amyloidosis (HCC)   Chronic systolic CHF (congestive heart failure) (HCC)   GERD (gastroesophageal reflux disease)   History of hypertension   Atrial fibrillation with RVR (HCC)   Community acquired pneumonia   Protein-calorie malnutrition, severe   Severe sepsis secondary to 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 He presented with pain and hypotension on admission. CT A/P shows sacral decubitus ulcer extending to the right gluteus muscle with no evidence of osteomyelitis. Seen by PCCM,  started empirically on antibiotics which he has completed his course on  03/21/2024. Surgery now signed off - He underwent bedside excisional debridement on 03/05/2024 and 03/07/2024. ID signed off 4/22, he's now completed antibiotics.  "Mainstay will be wound care and nutritional support". Patient is now medically stable to be transferred to skilled nursing facility.   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 Should expect months to years for complete healing. S/p excisional debridement on 4/15, 4/17 General surgery signed off on 4/21 (note from [employer], PA-C) Currently on no overlay mattress. Turn every 2 hours . Encourage out of bed to chair, try if possible to set him up in the chair daily. Recently eating 100% of meals. Ensure 3 times daily. Nutrition has been consulted.   History of paroxysmal atrial fibrillation (HCC) Continue amiodarone  and Eliquis . Cardiology has signed off.  Remains on telemetry due to the his daughter's preference.   History of HFrEF/ Cardiac amyloidosis /Nonischemic cardiomyopathy: Entresto  and metoprolol  held on admission. Cardiology recommended to follow-up. Blood pressure seems to be stable off Entresto  and metoprolol . Now on lasix , midodrine  for hypotension (holding parameters) On tafamadis for amyloid. Last note from cards was 5/2.  Appears they signed off on 4/21 [facility]).   Insulin -dependent diabetes mellitus type 2: HbA1c 8.4. Continue long-acting insulin  plus sliding scale. BG's appropriate   GERD: Stable.   Anemia of chronic disease: S/p 1 unit pRBC 4/30 Last Hb 10.2   Hypothyroidism: Continue Synthroid .   Constipation: Started on MiraLAX  p.o. twice daily.   Stage 4 sacral decubitus ulcer present on admission Last seen by WOC team on 03/28/24 Appreciate wound care (see above and orders for details). Wound care team  has met with the daughter at bedside.    Dysphagia: Severe Protein Calorie  Malnutrition Prealbumin of less than 5.  Nutrition consult appreciated. They have recommended: 1 packet of Juven bid to support wound healing. Magic Cup tid with meals. Mighty shake tid with meals Ensure enlive Po TID. Vitamin C  500 mg bid. MVI daily. Patient on a pured diet albumin  appears to be improving.   Weakness  Debility  MRI 3/27 without acute infarct. Lower extremity weakness>upper (? Related to chronic bed bound status/LE wound/pain?)  Continue PT/OT    Left lower lobe pneumonia: Completed course of antibiotics. Resolved.   DVT prophylaxis: Lovenox  Code Status: DNR Family Communication: Daughter at bed side Disposition Plan:    Status is: Inpatient Remains inpatient appropriate because: Severity of illness.   Consultants:  Wound care  Procedures: None  Antimicrobials: Anti-infectives (From admission, onward)    Start     Dose/Rate Route Frequency Ordered Stop   03/16/24 1015  cefTRIAXone  (ROCEPHIN ) 2 g in sodium chloride  0.9 % 100 mL IVPB        2 g 200 mL/hr over 30 Minutes Intravenous Every 24 hours 03/16/24 0929 03/20/24 1219   03/16/24 1015  doxycycline  (VIBRA -TABS) tablet 100 mg        100 mg Oral Every 12 hours 03/16/24 0929 03/20/24 2131   03/14/24 1000  metroNIDAZOLE  (FLAGYL ) tablet 500 mg        500 mg Oral Every 12 hours 03/14/24 0803 03/19/24 2219   03/13/24 1000  cefadroxil  (DURICEF) capsule 1,000 mg  Status:  Discontinued        1,000 mg Oral 2 times daily 03/12/24 1632 03/16/24 0929   03/07/24 2200  metroNIDAZOLE  (FLAGYL ) tablet 500 mg        500 mg Oral Every 12 hours 03/07/24 0840 03/11/24 2258   03/05/24 1930  vancomycin  (VANCOREADY) IVPB 1250 mg/250 mL  Status:  Discontinued        1,250 mg 166.7 mL/hr over 90 Minutes Intravenous Every 24 hours 03/04/24 2205 03/04/24 2224   03/05/24 1930  Vancomycin  (VANCOCIN ) 1,250 mg in sodium chloride  0.9 % 250 mL IVPB  Status:  Discontinued        1,250 mg 166.7 mL/hr over 90 Minutes Intravenous  Every 24 hours 03/04/24 2224 03/05/24 0717   03/05/24 1930  vancomycin  (VANCOREADY) IVPB 1500 mg/300 mL  Status:  Discontinued        1,500 mg 150 mL/hr over 120 Minutes Intravenous Every 24 hours 03/05/24 0717 03/12/24 1636   03/05/24 1400  ceFEPIme  (MAXIPIME ) 2 g in sodium chloride  0.9 % 100 mL IVPB  Status:  Discontinued        2 g 200 mL/hr over 30 Minutes Intravenous Every 8 hours 03/05/24 0717 03/12/24 1632   03/05/24 0600  ceFEPIme  (MAXIPIME ) 2 g in sodium chloride  0.9 % 100 mL IVPB  Status:  Discontinued        2 g 200 mL/hr over 30 Minutes Intravenous Every 12 hours 03/04/24 2205 03/05/24 0717   03/05/24 0000  metroNIDAZOLE  (FLAGYL ) IVPB 500 mg  Status:  Discontinued        500 mg 100 mL/hr over 60 Minutes Intravenous Every 12 hours 03/04/24 2206 03/04/24 2209   03/04/24 1730  aztreonam (AZACTAM) 2 g in sodium chloride  0.9 % 100 mL IVPB  Status:  Discontinued        2 g 200 mL/hr over 30 Minutes Intravenous  Once 03/04/24 1715 03/04/24 1724   03/04/24 1730  metroNIDAZOLE  (FLAGYL ) IVPB 500 mg  Status:  Discontinued        500 mg 100 mL/hr over 60 Minutes Intravenous Every 12 hours 03/04/24 1715 03/07/24 0840   03/04/24 1730  vancomycin  (VANCOREADY) IVPB 1500 mg/300 mL        1,500 mg 150 mL/hr over 120 Minutes Intravenous  Once 03/04/24 1715 03/04/24 2153   03/04/24 1730  ceFEPIme  (MAXIPIME ) 2 g in sodium chloride  0.9 % 100 mL IVPB        2 g 200 mL/hr over 30 Minutes Intravenous  Once 03/04/24 1724 03/04/24 1826      Subjective: Patient was seen and examined at bedside. Overnight events noted. Patient reports feeling better,  dizziness has improved.   He was having breakfast, denies any other concerns  Objective: Vitals:   04/14/24 2008 04/15/24 0140 04/15/24 0530 04/15/24 0743  BP: 123/73 (!) 106/59 127/76 112/70  Pulse: 74 74 80 77  Resp: 19 18  18   Temp: 98.3 F (36.8 C) 98.4 F (36.9 C) 97.6 F (36.4 C) 97.8 F (36.6 C)  TempSrc:  Oral Oral Oral  SpO2: 100%  100% 100% 100%  Weight:      Height:        Intake/Output Summary (Last 24 hours) at 04/15/2024 1110 Last data filed at 04/15/2024 0500 Gross per 24 hour  Intake 300 ml  Output 1700 ml  Net -1400 ml   Filed Weights   03/04/24 1656 03/15/24 0500  Weight: 68.5 kg 75.3 kg    Examination:  General exam: Appears calm, comfortable, deconditioned, not in any acute distress. Respiratory system: CTA Bilaterally. Respiratory effort normal.  RR 14 Cardiovascular system: S1 & S2 heard, RRR. No JVD, murmurs, rubs, gallops or clicks.  Gastrointestinal system: Abdomen is non distended, soft and non tender. Normal bowel sounds heard. Central nervous system: Alert and oriented x 3. No focal neurological deficits. Extremities: No edema, no cyanosis, no clubbing. Skin: Decubitus ulcer stage III-IV Psychiatry: Judgement and insight appear normal. Mood & affect appropriate.    Data Reviewed: I have personally reviewed following labs and imaging studies  CBC: Recent Labs  Lab 04/10/24 0552 04/14/24 0737  WBC 6.1 6.0  HGB 10.2* 9.9*  HCT 31.0* 30.6*  MCV 86.8 87.2  PLT 340 287   Basic Metabolic Panel: Recent Labs  Lab 04/10/24 0552 04/14/24 0737  NA 140 139  K 3.6 3.7  CL 108 105  CO2 21* 26  GLUCOSE 109* 96  BUN 24* 27*  CREATININE 0.84 0.86  CALCIUM  8.0* 8.0*  MG 1.8  --   PHOS 4.2  --    GFR: Estimated Creatinine Clearance: 70.5 mL/min (by C-G formula based on SCr of 0.86 mg/dL). Liver Function Tests: No results for input(s): "AST", "ALT", "ALKPHOS", "BILITOT", "PROT", "ALBUMIN " in the last 168 hours. No results for input(s): "LIPASE", "AMYLASE" in the last 168 hours. No results for input(s): "AMMONIA" in the last 168 hours. Coagulation Profile: No results for input(s): "INR", "PROTIME" in the last 168 hours. Cardiac Enzymes: No results for input(s): "CKTOTAL", "CKMB", "CKMBINDEX", "TROPONINI" in the last 168 hours. BNP (last 3 results) No results for input(s): "PROBNP"  in the last 8760 hours. HbA1C: No results for input(s): "HGBA1C" in the last 72 hours. CBG: Recent Labs  Lab 04/14/24 0811 04/14/24 1244 04/14/24 1612 04/14/24 2109 04/15/24 0744  GLUCAP 107* 117* 88 152* 157*   Lipid Profile: No results for input(s): "CHOL", "HDL", "LDLCALC", "TRIG", "CHOLHDL", "LDLDIRECT" in the last 72 hours.  Thyroid  Function Tests: No results for input(s): "TSH", "T4TOTAL", "FREET4", "T3FREE", "THYROIDAB" in the last 72 hours. Anemia Panel: No results for input(s): "VITAMINB12", "FOLATE", "FERRITIN", "TIBC", "IRON", "RETICCTPCT" in the last 72 hours. Sepsis Labs: No results for input(s): "PROCALCITON", "LATICACIDVEN" in the last 168 hours.  No results found for this or any previous visit (from the past 240 hours).   Radiology Studies: No results found.  Scheduled Meds:  acetaminophen   500 mg Oral TID   amiodarone   200 mg Oral Daily   apixaban   5 mg Oral BID   atorvastatin   40 mg Oral Daily   Chlorhexidine  Gluconate Cloth  6 each Topical Daily   collagenase    Topical Daily   diclofenac  Sodium  4 g Topical QID   feeding supplement  237 mL Oral TID BM   ferrous sulfate   325 mg Oral QHS   furosemide   20 mg Oral Daily   guaiFENesin   600 mg Oral BID   insulin  aspart  0-15 Units Subcutaneous TID WC   insulin  aspart  0-5 Units Subcutaneous QHS   insulin  aspart  4 Units Subcutaneous TID WC   insulin  glargine-yfgn  7 Units Subcutaneous Daily   levothyroxine   50 mcg Oral Q0600   liver oil-zinc  oxide   Topical BID   midodrine   5 mg Oral TID WC   multivitamin with minerals  1 tablet Oral Daily   nutrition supplement (JUVEN)  1 packet Oral BID BM   mouth rinse  15 mL Mouth Rinse 4 times per day   oxyCODONE   10 mg Oral Q12H   phosphorus  500 mg Oral TID   polyethylene glycol  17 g Oral BID   senna-docusate  1 tablet Oral QHS   sodium chloride  flush  3 mL Intravenous Q12H   Tafamidis   1 capsule Oral Daily   Continuous Infusions:   LOS: 42 days    Time  spent: 35 mins    Magdalene School, MD [facility]   If 7PM-7AM, please contact night-coverage

## 2024-04-15 NOTE — Progress Notes (Signed)
 Mobility Specialist: Progress Note   04/15/24 1452  Mobility  Activity Transferred from chair to bed  Level of Assistance +2 (takes two people)  Assistive Device MaxiMove  Activity Response Tolerated well  Mobility Referral Yes  Mobility visit 1 Mobility  Mobility Specialist Start Time (ACUTE ONLY) 1151  Mobility Specialist Stop Time (ACUTE ONLY) 1207  Mobility Specialist Time Calculation (min) (ACUTE ONLY) 16 min    Pt received in chair, requesting assistance to get back to bed. Unable to tolerate the chair for and c/o bottom pain. TotA+2 transfer via maximove. TotA+2 for bed mobility. Left in bed with all needs met, call bell in reach.   Deloria Fetch Mobility Specialist Please contact via SecureChat or Rehab office at [phone]

## 2024-04-15 NOTE — Plan of Care (Signed)
 Assumed care at 1900. Pt has been Aox4. Pt has expressed c/o pain overnight, see MAR. Pt has been turned and repositioned overnight. Pt tolerated medications with applesauce whole. Pt expressed goals "to walk again". Pt has shown much improvement. No significant events overnight.    Problem: Fluid Volume: Goal: Hemodynamic stability will improve Outcome: Progressing   Problem: Clinical Measurements: Goal: Diagnostic test results will improve Outcome: Progressing Goal: Signs and symptoms of infection will decrease Outcome: Progressing   Problem: Coping: Goal: Psychosocial and spiritual needs will be supported Outcome: Progressing   Problem: Education: Goal: Ability to describe self-care measures that may prevent or decrease complications (Diabetes Survival Skills Education) will improve Outcome: Progressing Goal: Individualized Educational Video(s) Outcome: Progressing   Problem: Coping: Goal: Ability to adjust to condition or change in health will improve Outcome: Progressing   Problem: Health Behavior/Discharge Planning: Goal: Ability to identify and utilize available resources and services will improve Outcome: Progressing Goal: Ability to manage health-related needs will improve Outcome: Progressing

## 2024-04-16 DIAGNOSIS — R6520 Severe sepsis without septic shock: Secondary | ICD-10-CM | POA: Diagnosis not present

## 2024-04-16 DIAGNOSIS — A419 Sepsis, unspecified organism: Secondary | ICD-10-CM | POA: Diagnosis not present

## 2024-04-16 LAB — GLUCOSE, CAPILLARY
Glucose-Capillary: 114 mg/dL — ABNORMAL HIGH (ref 70–99)
Glucose-Capillary: 167 mg/dL — ABNORMAL HIGH (ref 70–99)
Glucose-Capillary: 137 mg/dL — ABNORMAL HIGH (ref 70–99)
Glucose-Capillary: 193 mg/dL — ABNORMAL HIGH (ref 70–99)

## 2024-04-16 NOTE — Progress Notes (Signed)
 Occupational Therapy Treatment Patient Details Name: Earl [name] MRN: [id number] DOB: 04-15-1941 Today's Date: 04/16/2024   History of present illness 83 y.o. male admitted from [facility] rehab on 03/04/24 with SOB, sacral pain, Afib with RVR. Pt with acute metabolic encephalopathy, rhabdomyolysis and sepsis secondary to sacral wound infection. 4/15 & 4/17 bedside sacral wound debridement. PMhx: adm 3/26-4/08/2024 with fall, AMS, COVID; d/c to SNF rehab. CVA (2021, residual R-side weakness), T2DM, HTN, CHF, MGUS, Afib on Eliquis .   OT comments  Pt c/o pain to BLEs and sacrum, had just finished eating and sliding down in bed. Pt assisted with positioning, total A x2 for scooting back in bed and rolling L/R, pillows placed under each side to reduce pressure to sacral region. Pt unable to move BLEs to assist with positioning, significant assisted to reach hand rails to assist with scooting/rolling. Pt left resting comfortably in bed, feeling in too much pain to complete bed level or EOB activities, will continue to see acutely to progress as able, DC to postacute rehab still appropriate.        If plan is discharge home, recommend the following:  A lot of help with walking and/or transfers;A lot of help with bathing/dressing/bathroom;Assistance with cooking/housework;Assist for transportation;Help with stairs or ramp for entrance;Two people to help with walking and/or transfers   Equipment Recommendations  Other (comment) (defer)    Recommendations for Other Services      Precautions / Restrictions Precautions Precautions: Fall;Other (comment) Recall of Precautions/Restrictions: Intact Precaution/Restrictions Comments: significant pain from sacral wound; b/b incontinence Restrictions Weight Bearing Restrictions Per Provider Order: No       Mobility Bed Mobility Overal bed mobility: Needs Assistance   Rolling: Total assist, Used rails         General bed mobility  comments: Pt uncomfortable in bed, tearful from pain to BLEs and sacrum, assisted iwth positioning, total A.    Transfers Overall transfer level: Needs assistance                 General transfer comment: not attempted     Balance Overall balance assessment: Needs assistance                                         ADL either performed or assessed with clinical judgement   ADL Overall ADL's : Needs assistance/impaired Eating/Feeding: Set up;Minimal assistance;Bed level                           Toileting- Clothing Manipulation and Hygiene: Total assistance;Bed level         General ADL Comments: just finishing eating at bed level when arrived, Pt able to complete with set up, increased spillage and assist for clean up.    Extremity/Trunk Assessment Upper Extremity Assessment Upper Extremity Assessment: Generalized weakness RUE Deficits / Details: Pt has full grasp in RUE now (he has been working with blue foam block--changed to green foam block today) RUE Coordination: decreased fine motor LUE Deficits / Details: Pt continues with decreased composite finger flexion on left hand and he is aware (he pointed it out to me and even used his right hand to try and help his left fingers flex more)--he has been using yellow foam block (changed to blue foam block today) LUE Coordination: decreased fine motor  Vision       Perception     Praxis     Communication Communication Communication: Impaired Factors Affecting Communication: Reduced clarity of speech   Cognition Arousal: Alert Behavior During Therapy: WFL for tasks assessed/performed Cognition: No apparent impairments             OT - Cognition Comments: follows simple commands with more time, slow to process.                 Following commands: Impaired Following commands impaired: Follows one step commands with increased time      Cueing   Cueing  Techniques: Verbal cues, Tactile cues, Gestural cues  Exercises      Shoulder Instructions       General Comments      Pertinent Vitals/ Pain       Pain Assessment Pain Assessment: Faces Faces Pain Scale: Hurts whole lot Pain Location: sacrum, BLEs Pain Descriptors / Indicators: Aching, Discomfort, Grimacing Pain Intervention(s): Monitored during session  Home Living                                          Prior Functioning/Environment              Frequency  Min 2X/week        Progress Toward Goals  OT Goals(current goals can now be found in the care plan section)  Progress towards OT goals: Progressing toward goals  Acute Rehab OT Goals Patient Stated Goal: to manage pain OT Goal Formulation: With patient Time For Goal Achievement: 04/30/24 Potential to Achieve Goals: Fair ADL Goals Pt Will Perform Eating: with supervision;with adaptive utensils;sitting;bed level Pt Will Perform Grooming: with supervision;bed level;with adaptive equipment Pt Will Perform Upper Body Bathing: with min assist;bed level Pt Will Perform Upper Body Dressing: with max assist;with mod assist;bed level Pt Will Perform Lower Body Dressing: sit to/from stand;with contact guard assist Pt Will Transfer to Toilet: with contact guard assist;ambulating;regular height toilet Pt Will Perform Toileting - Clothing Manipulation and hygiene: sit to/from stand;with contact guard assist Pt/caregiver will Perform Home Exercise Program: Both right and left upper extremity;With minimal assist;Increased ROM;Increased strength;With written HEP provided Additional ADL Goal #1: Pt will perform bed mobility with mod A for repositioning in order to prevent skin breakdown  Plan      Co-evaluation                 AM-PAC OT "6 Clicks" Daily Activity     Outcome Measure   Help from another person eating meals?: A Little Help from another person taking care of personal grooming?:  A Little Help from another person toileting, which includes using toliet, bedpan, or urinal?: Total Help from another person bathing (including washing, rinsing, drying)?: A Lot Help from another person to put on and taking off regular upper body clothing?: A Lot Help from another person to put on and taking off regular lower body clothing?: Total 6 Click Score: 12    End of Session    OT Visit Diagnosis: Other abnormalities of gait and mobility (R26.89);Muscle weakness (generalized) (M62.81);History of falling (Z91.81) Pain - Right/Left: Left Pain - part of body: Arm;Hand   Activity Tolerance Patient limited by pain   Patient Left in bed;with call bell/phone within reach   Nurse Communication Mobility status        Time: 8295-6213 OT Time Calculation (  min): 12 min  Charges: OT General Charges $OT Visit: 1 Visit OT Treatments $Therapeutic Activity: 8-22 mins  [address], OTR/L   Scherry Curtis 04/16/2024, 2:12 PM

## 2024-04-16 NOTE — Plan of Care (Signed)
  Problem: Fluid Volume: Goal: Hemodynamic stability will improve Outcome: Progressing   Problem: Clinical Measurements: Goal: Diagnostic test results will improve Outcome: Progressing Goal: Signs and symptoms of infection will decrease Outcome: Progressing   Problem: Respiratory: Goal: Ability to maintain adequate ventilation will improve Outcome: Progressing   Problem: Education: Goal: Knowledge of risk factors and measures for prevention of condition will improve Outcome: Progressing   Problem: Coping: Goal: Psychosocial and spiritual needs will be supported Outcome: Progressing   Problem: Respiratory: Goal: Will maintain a patent airway Outcome: Progressing Goal: Complications related to the disease process, condition or treatment will be avoided or minimized Outcome: Progressing   Problem: Education: Goal: Ability to describe self-care measures that may prevent or decrease complications (Diabetes Survival Skills Education) will improve Outcome: Progressing Goal: Individualized Educational Video(s) Outcome: Progressing

## 2024-04-16 NOTE — Progress Notes (Signed)
 Nutrition Follow-up  DOCUMENTATION CODES:   Severe malnutrition in context of chronic illness  INTERVENTION:   -Obtain new wt  - Continue 1 packet Juven BID to support wound healing, each packet provides 95 calories, 2.5 grams of protein (collagen), and 9.8 grams of carbohydrate   - Continue Magic Cup TID with meals, each supplement provides 290 kcal and 9 grams of protein   - Continue Mighty Shake TID with meals, each supplement provides 330 kcals and 9 grams of protein   - Continue Ensure Enlive po TID, each supplement provides 350 kcal and 20 grams of protein.   - Continue MVI with minerals daily  NUTRITION DIAGNOSIS:   Severe Malnutrition related to chronic illness as evidenced by energy intake < or equal to 75% for > or equal to 1 month, severe muscle depletion.  Ongoing  GOAL:   Patient will meet greater than or equal to 90% of their needs  Progressing   MONITOR:   PO intake, Supplement acceptance, Weight trends, Skin  REASON FOR ASSESSMENT:   Consult Assessment of nutrition requirement/status  ASSESSMENT:   83 year old male who presented to the ED on 03/04/24 from [facility] due to sacral pain. PMH of T2DM, HTN, HLD, CHF, atrial fibrillation, stroke with right-sided weakness, GERD, BPH, hypothyroidism, MGUS, transthyretin associated amyloid cardiomyopathy, chronic pancytopenia, recent hospital admission for COVID-19. Pt admitted with sepsis secondary to sacral pressure injury infection.  5/23- s/p BSE- dysphagia 1 diet with thin liquids  Reviewed I/O's: -1.4 L x 24 hours and -15.9 L since 04/02/24   Pt unavailable at time of visit. Attempted to speak with pt via call to hospital room phone, however, unable to reach.   Per CWOCN note on 04/10/24, pt with stage 2 pressure injury to lt hip, DTPI to lt shoulder, unstageable scarum, skin intact to rt heel and rt great toe. Pt on a low air loss mattress for moisture management and pressure distribution.   Case  discussed with RN, who reports pt is tolerating current diet texture well, eating well, and taking supplements. Documented meal completions 50-100%.   No new wt since 03/15/24; RD will re-order wt to assess acute weight changes.   TOC following for discharge disposition. Per MD, pt is medically stable for discharge and awaiting SNF placement.   Medications reviewed and include ferrous sulfate , lasix , phosphoris, miralax , senokot.   Labs reviewed: CBGS: 88-195 (inpatient orders for glycemic control are 0-15 units insulin  aspart TID with meals, 0-5 units insulin  aspart daily at bedtime, 4 units insulin  aspart TID with meals, and units insulin  glargine-yfgn daily).    Diet Order:   Diet Order             DIET - DYS 1 Room service appropriate? Yes; Fluid consistency: Thin  Diet effective now                   EDUCATION NEEDS:   No education needs have been identified at this time  Skin:  Skin Assessment: Skin Integrity Issues: (per most recent WOC note) Skin Integrity Issues:: Stage II, DTI, Unstageable, Other (Comment) DTI: L shoulder, great toe Stage II: L hip Unstageable: sacrum Other: wound to R heel, skin tear to L elbow  Last BM:  04/15/24 (type 6)  Height:   Ht Readings from Last 1 Encounters:  03/04/24 5\' 11"  (1.803 m)    Weight:   Wt Readings from Last 1 Encounters:  03/15/24 75.3 kg    BMI:  Body mass index is  23.15 kg/m.  Estimated Nutritional Needs:   Kcal:  2000-2200  Protein:  95-100 grams  Fluid:  >2.0 L    Herschel Lords, RD, LDN, CDCES Registered Dietitian III Certified Diabetes Care and Education Specialist If unable to reach this RD, please use "RD Inpatient" group chat on secure chat between hours of 8am-4 pm daily

## 2024-04-16 NOTE — Progress Notes (Signed)
 PROGRESS NOTE    Earl [name]  ZOX:[id number] DOB: March 15, 1941 DOA: 03/04/2024 PCP: [name], MD   Brief Narrative: This 83 yrs old male with past medical history of paroxysmal atrial fibrillation on Eliquis , insulin -dependent diabetes mellitus type 2, essential hypertension, CVA with right residual weakness, MGUS, BPH, HFr EF of 40%, He was discharged, recently from [facility] after admitted for acute metabolic encephalopathy,  rhabdomyolysis and orthostatic, chronic pancytopenia presents to the ED from skilled nursing facility for shortness of breath,  sacral pain, In the ED, he was found to be in RVR, tachypneic with saturations greater than 90%.  Critical care was consulted for persistent hypotension.  Respiratory panel was negative.  General surgery was consulted for sacral wound debridement with bedside excision on 03/05/2024 and 03/07/2024.  He is now hemodynamically stable for discharge.    Assessment & Plan:   Principal Problem:   Severe sepsis (HCC) Active Problems:   Paroxysmal atrial fibrillation (HCC)   Sepsis (HCC)   Atrial flutter (HCC)   Sacral ulcer (HCC)   MGUS (monoclonal gammopathy of unknown significance)   Essential hypertension   Hyperlipidemia   Insulin  dependent type 2 diabetes mellitus (HCC)   History of CVA (cerebrovascular accident)   Cardiac amyloidosis (HCC)   Chronic systolic CHF (congestive heart failure) (HCC)   GERD (gastroesophageal reflux disease)   History of hypertension   Atrial fibrillation with RVR (HCC)   Community acquired pneumonia   Protein-calorie malnutrition, severe   Severe sepsis secondary to 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 He presented with pain and hypotension on admission. CT A/P shows sacral decubitus ulcer extending to the right gluteus muscle with no evidence of osteomyelitis. Seen by PCCM,  started empirically on antibiotics which he has completed his course on  03/21/2024. Surgery now signed off - He underwent bedside excisional debridement on 03/05/2024 and 03/07/2024. ID signed off 4/22, he's now completed antibiotics.  "Mainstay will be wound care and nutritional support". Patient is now medically stable to be transferred to skilled nursing facility.   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 Should expect months to years for complete healing. S/p excisional debridement on 4/15, 4/17 General surgery signed off on 4/21 (note from [employer], PA-C) Currently on no overlay mattress. Turn every 2 hours . Encourage out of bed to chair, try if possible to set him up in the chair daily. Recently eating 100% of meals. Ensure 3 times daily. Nutrition has been consulted.   History of paroxysmal atrial fibrillation (HCC) Continue amiodarone  and Eliquis . Cardiology has signed off.  Remains on telemetry due to the his daughter's preference.   History of HFrEF/ Cardiac amyloidosis /Nonischemic cardiomyopathy: Entresto  and metoprolol  held on admission. Cardiology recommended to follow-up. Blood pressure seems to be stable off Entresto  and metoprolol . Now on lasix , midodrine  for hypotension (holding parameters) On tafamadis for amyloid. Last note from cards was 5/2.  Appears they signed off on 4/21 [facility]).   Insulin -dependent diabetes mellitus type 2: HbA1c 8.4. Continue long-acting insulin  plus sliding scale. BG's appropriate   GERD: Stable.   Anemia of chronic disease: S/p 1 unit pRBC 4/30 Last Hb 10.2   Hypothyroidism: Continue Synthroid .   Constipation: Started on MiraLAX  p.o. twice daily.   Stage 4 sacral decubitus ulcer present on admission Last seen by WOC team on 03/28/24 Appreciate wound care (see above and orders for details). Wound care team  has met with the daughter at bedside.    Dysphagia: Severe Protein Calorie  Malnutrition Prealbumin of less than 5.  Nutrition consult appreciated. They have recommended: 1 packet of Juven bid to support wound healing. Magic Cup tid with meals. Mighty shake tid with meals Ensure enlive Po TID. Vitamin C  500 mg bid. MVI daily. Patient on a pured diet albumin  appears to be improving.   Weakness  Debility  MRI 3/27 without acute infarct. Lower extremity weakness>upper (? Related to chronic bed bound status/LE wound/pain?)  Continue PT/OT    Left lower lobe pneumonia: Completed course of antibiotics. Resolved.   DVT prophylaxis: Lovenox  Code Status: DNR Family Communication: Daughter at bed side Disposition Plan:    Status is: Inpatient Remains inpatient appropriate because: Severity of illness.   Consultants:  Wound care  Procedures: None  Antimicrobials: Anti-infectives (From admission, onward)    Start     Dose/Rate Route Frequency Ordered Stop   03/16/24 1015  cefTRIAXone  (ROCEPHIN ) 2 g in sodium chloride  0.9 % 100 mL IVPB        2 g 200 mL/hr over 30 Minutes Intravenous Every 24 hours 03/16/24 0929 03/20/24 1219   03/16/24 1015  doxycycline  (VIBRA -TABS) tablet 100 mg        100 mg Oral Every 12 hours 03/16/24 0929 03/20/24 2131   03/14/24 1000  metroNIDAZOLE  (FLAGYL ) tablet 500 mg        500 mg Oral Every 12 hours 03/14/24 0803 03/19/24 2219   03/13/24 1000  cefadroxil  (DURICEF) capsule 1,000 mg  Status:  Discontinued        1,000 mg Oral 2 times daily 03/12/24 1632 03/16/24 0929   03/07/24 2200  metroNIDAZOLE  (FLAGYL ) tablet 500 mg        500 mg Oral Every 12 hours 03/07/24 0840 03/11/24 2258   03/05/24 1930  vancomycin  (VANCOREADY) IVPB 1250 mg/250 mL  Status:  Discontinued        1,250 mg 166.7 mL/hr over 90 Minutes Intravenous Every 24 hours 03/04/24 2205 03/04/24 2224   03/05/24 1930  Vancomycin  (VANCOCIN ) 1,250 mg in sodium chloride  0.9 % 250 mL IVPB  Status:  Discontinued        1,250 mg 166.7 mL/hr over 90 Minutes Intravenous  Every 24 hours 03/04/24 2224 03/05/24 0717   03/05/24 1930  vancomycin  (VANCOREADY) IVPB 1500 mg/300 mL  Status:  Discontinued        1,500 mg 150 mL/hr over 120 Minutes Intravenous Every 24 hours 03/05/24 0717 03/12/24 1636   03/05/24 1400  ceFEPIme  (MAXIPIME ) 2 g in sodium chloride  0.9 % 100 mL IVPB  Status:  Discontinued        2 g 200 mL/hr over 30 Minutes Intravenous Every 8 hours 03/05/24 0717 03/12/24 1632   03/05/24 0600  ceFEPIme  (MAXIPIME ) 2 g in sodium chloride  0.9 % 100 mL IVPB  Status:  Discontinued        2 g 200 mL/hr over 30 Minutes Intravenous Every 12 hours 03/04/24 2205 03/05/24 0717   03/05/24 0000  metroNIDAZOLE  (FLAGYL ) IVPB 500 mg  Status:  Discontinued        500 mg 100 mL/hr over 60 Minutes Intravenous Every 12 hours 03/04/24 2206 03/04/24 2209   03/04/24 1730  aztreonam (AZACTAM) 2 g in sodium chloride  0.9 % 100 mL IVPB  Status:  Discontinued        2 g 200 mL/hr over 30 Minutes Intravenous  Once 03/04/24 1715 03/04/24 1724   03/04/24 1730  metroNIDAZOLE  (FLAGYL ) IVPB 500 mg  Status:  Discontinued        500 mg 100 mL/hr over 60 Minutes Intravenous Every 12 hours 03/04/24 1715 03/07/24 0840   03/04/24 1730  vancomycin  (VANCOREADY) IVPB 1500 mg/300 mL        1,500 mg 150 mL/hr over 120 Minutes Intravenous  Once 03/04/24 1715 03/04/24 2153   03/04/24 1730  ceFEPIme  (MAXIPIME ) 2 g in sodium chloride  0.9 % 100 mL IVPB        2 g 200 mL/hr over 30 Minutes Intravenous  Once 03/04/24 1724 03/04/24 1826      Subjective: Patient was seen and examined at bedside. Overnight events noted. Patient reports feeling much improved.  Dizziness has resolved. He was having breakfast, denies any other concerns  Objective: Vitals:   04/15/24 1239 04/15/24 1635 04/15/24 2006 04/16/24 0821  BP: 122/75 125/72 (!) 112/59 122/77  Pulse: 78 80 72 83  Resp: 19 18 17    Temp: 97.6 F (36.4 C) 97.6 F (36.4 C) 97.6 F (36.4 C) 98.6 F (37 C)  TempSrc: Oral Oral Oral   SpO2:  100% 100% 100% 99%  Weight:      Height:        Intake/Output Summary (Last 24 hours) at 04/16/2024 1136 Last data filed at 04/16/2024 0454 Gross per 24 hour  Intake 180 ml  Output 1600 ml  Net -1420 ml   Filed Weights   03/04/24 1656 03/15/24 0500  Weight: 68.5 kg 75.3 kg    Examination:  General exam: Appears calm, comfortable, deconditioned, not in any acute distress. Respiratory system: CTA Bilaterally. Respiratory effort normal.  RR 14 Cardiovascular system: S1 & S2 heard, RRR. No JVD, murmurs, rubs, gallops or clicks.  Gastrointestinal system: Abdomen is non distended, soft and non tender. Normal bowel sounds heard. Central nervous system: Alert and oriented x 3. No focal neurological deficits. Extremities: No edema, no cyanosis, no clubbing. Skin: Decubitus ulcer stage III-IV Psychiatry: Judgement and insight appear normal. Mood & affect appropriate.    Data Reviewed: I have personally reviewed following labs and imaging studies  CBC: Recent Labs  Lab 04/10/24 0552 04/14/24 0737  WBC 6.1 6.0  HGB 10.2* 9.9*  HCT 31.0* 30.6*  MCV 86.8 87.2  PLT 340 287   Basic Metabolic Panel: Recent Labs  Lab 04/10/24 0552 04/14/24 0737  NA 140 139  K 3.6 3.7  CL 108 105  CO2 21* 26  GLUCOSE 109* 96  BUN 24* 27*  CREATININE 0.84 0.86  CALCIUM  8.0* 8.0*  MG 1.8  --   PHOS 4.2  --    GFR: Estimated Creatinine Clearance: 70.5 mL/min (by C-G formula based on SCr of 0.86 mg/dL). Liver Function Tests: No results for input(s): "AST", "ALT", "ALKPHOS", "BILITOT", "PROT", "ALBUMIN " in the last 168 hours. No results for input(s): "LIPASE", "AMYLASE" in the last 168 hours. No results for input(s): "AMMONIA" in the last 168 hours. Coagulation Profile: No results for input(s): "INR", "PROTIME" in the last 168 hours. Cardiac Enzymes: No results for input(s): "CKTOTAL", "CKMB", "CKMBINDEX", "TROPONINI" in the last 168 hours. BNP (last 3 results) No results for input(s):  "PROBNP" in the last 8760 hours. HbA1C: No results for input(s): "HGBA1C" in the last 72 hours. CBG: Recent Labs  Lab 04/15/24 0744 04/15/24 1242 04/15/24 1636 04/15/24 2007 04/16/24 0821  GLUCAP 157* 195* 126* 104* 114*   Lipid Profile: No results for input(s): "CHOL", "HDL", "LDLCALC", "TRIG", "CHOLHDL", "LDLDIRECT" in the last 72 hours. Thyroid   Function Tests: No results for input(s): "TSH", "T4TOTAL", "FREET4", "T3FREE", "THYROIDAB" in the last 72 hours. Anemia Panel: No results for input(s): "VITAMINB12", "FOLATE", "FERRITIN", "TIBC", "IRON", "RETICCTPCT" in the last 72 hours. Sepsis Labs: No results for input(s): "PROCALCITON", "LATICACIDVEN" in the last 168 hours.  No results found for this or any previous visit (from the past 240 hours).   Radiology Studies: No results found.  Scheduled Meds:  acetaminophen   500 mg Oral TID   amiodarone   200 mg Oral Daily   apixaban   5 mg Oral BID   atorvastatin   40 mg Oral Daily   Chlorhexidine  Gluconate Cloth  6 each Topical Daily   collagenase    Topical Daily   diclofenac  Sodium  4 g Topical QID   feeding supplement  237 mL Oral TID BM   ferrous sulfate   325 mg Oral QHS   furosemide   20 mg Oral Daily   guaiFENesin   600 mg Oral BID   insulin  aspart  0-15 Units Subcutaneous TID WC   insulin  aspart  0-5 Units Subcutaneous QHS   insulin  aspart  4 Units Subcutaneous TID WC   insulin  glargine-yfgn  7 Units Subcutaneous Daily   levothyroxine   50 mcg Oral Q0600   liver oil-zinc  oxide   Topical BID   midodrine   5 mg Oral TID WC   multivitamin with minerals  1 tablet Oral Daily   nutrition supplement (JUVEN)  1 packet Oral BID BM   mouth rinse  15 mL Mouth Rinse 4 times per day   oxyCODONE   10 mg Oral Q12H   phosphorus  500 mg Oral TID   polyethylene glycol  17 g Oral BID   senna-docusate  1 tablet Oral QHS   sodium chloride  flush  3 mL Intravenous Q12H   Tafamidis   1 capsule Oral Daily   Continuous Infusions:   LOS: 43 days     Time spent: 35 mins    Magdalene School, MD [facility]   If 7PM-7AM, please contact night-coverage

## 2024-04-16 NOTE — Consult Note (Addendum)
 WOC Nurse wound follow up Wound type: Multiple wounds. Pressure injuries   1 - Left Hip PI stage 2 Wound bed: 100% cover by scab, blister ruptured, partial raised skin surrounding. Measurement: 5 cm x 2.5 cm Drainage (amount, consistency, odor) none Periwound: Dressing procedure/placement/frequency: Apply Xeroform, cover with foam dressing, change daily.   2 - Left shoulder DTPI Measurement: 3 cm x 2 cm Wound bed: 100% cover by scab, partial raised skin surrounding. Drainage (amount, consistency, odor) none Periwound: intact Dressing procedure/placement/frequency: Apply Santyl  (change daily) and cover with foam dressing, change every 3 days or PRN.    3 - Sacral Unstageable PI Measurement: 8 x 8 x 0.5 cm. Tunneling 4 cm. Wound bed: 100% beefy red. Tunneling at 12 o'clock position with 4 cm dept. Drainage (amount, consistency, odor) Moderate amount, no odor, serous. Periwound: intacts, white scar tissue surrounding, viable edges. Dressing procedure/placement/frequency: Cleanse with Vashe#[id number]. Apply Aquacel in  the wound bed and fill the tunneling using a swab (change daily) , cover with sacral foam dressing, change daily. Per patient's daughter, patient has irritation from xeroform on the sacral wound. Switched back to silver hydrofiber per her request. She would like foam dressing change daily and PRN to keep the wound "clean".  Per patient's daughter there has been some type of supply issues with the Vashe solution, I have requested the unit secretary to order two bottles.  Would suggest staff order 2 at each order when supply is running low   4 - Right heel Measurement: 2 x 1.2 cm Wound bed: no skin breakdown, black/purple coloration. Drainage (amount, consistency, odor) NONE Periwound: Intact, dry Dressing procedure/placement/frequency: Protect with foam dressing, change every 3 days or PRN.  5/6 Right great toe: distal end 0.2 cm scabbed, skin intact. Left great toe 0.3 cm  scabbed, skin intact. Dressing procedure/placement/frequency: Leave open air.   WOC team will follow weekly.   Please reconsult if further assistance is needed. Thank-you,  Rachel Budds BSN, RN, [employer], WOC  (Pager: [phone])

## 2024-04-17 ENCOUNTER — Ambulatory Visit: Payer: Medicare Other | Admitting: Sports Medicine

## 2024-04-17 ENCOUNTER — Other Ambulatory Visit: Payer: Self-pay

## 2024-04-17 DIAGNOSIS — E854 Organ-limited amyloidosis: Secondary | ICD-10-CM | POA: Diagnosis not present

## 2024-04-17 DIAGNOSIS — I480 Paroxysmal atrial fibrillation: Secondary | ICD-10-CM | POA: Diagnosis not present

## 2024-04-17 DIAGNOSIS — L98429 Non-pressure chronic ulcer of back with unspecified severity: Secondary | ICD-10-CM | POA: Diagnosis not present

## 2024-04-17 DIAGNOSIS — A419 Sepsis, unspecified organism: Secondary | ICD-10-CM | POA: Diagnosis not present

## 2024-04-17 LAB — CBC
HCT: 31.1 % — ABNORMAL LOW (ref 39.0–52.0)
Hemoglobin: 10 g/dL — ABNORMAL LOW (ref 13.0–17.0)
MCH: 28.3 pg (ref 26.0–34.0)
MCHC: 32.2 g/dL (ref 30.0–36.0)
MCV: 88.1 fL (ref 80.0–100.0)
Platelets: 305 10*3/uL (ref 150–400)
RBC: 3.53 MIL/uL — ABNORMAL LOW (ref 4.22–5.81)
RDW: 16.1 % — ABNORMAL HIGH (ref 11.5–15.5)
WBC: 6.2 10*3/uL (ref 4.0–10.5)
nRBC: 0 % (ref 0.0–0.2)

## 2024-04-17 LAB — GLUCOSE, CAPILLARY
Glucose-Capillary: 136 mg/dL — ABNORMAL HIGH (ref 70–99)
Glucose-Capillary: 129 mg/dL — ABNORMAL HIGH (ref 70–99)
Glucose-Capillary: 123 mg/dL — ABNORMAL HIGH (ref 70–99)
Glucose-Capillary: 153 mg/dL — ABNORMAL HIGH (ref 70–99)

## 2024-04-17 LAB — BASIC METABOLIC PANEL WITH GFR
Anion gap: 8 (ref 5–15)
BUN: 30 mg/dL — ABNORMAL HIGH (ref 8–23)
CO2: 26 mmol/L (ref 22–32)
Calcium: 8.2 mg/dL — ABNORMAL LOW (ref 8.9–10.3)
Chloride: 107 mmol/L (ref 98–111)
Creatinine, Ser: 0.78 mg/dL (ref 0.61–1.24)
GFR, Estimated: >60 mL/min (ref >60)
Glucose, Bld: 146 mg/dL — ABNORMAL HIGH (ref 70–99)
Potassium: 3.9 mmol/L (ref 3.5–5.1)
Sodium: 141 mmol/L (ref 135–145)

## 2024-04-17 NOTE — Care Management Important Message (Signed)
 Important Message  Patient Details  Name: Earl [name] MRN: [id number] Date of Birth: [birth year]   Important Message Given:  Yes - Medicare IM     Wynonia Hedges 04/17/2024, 4:54 PM

## 2024-04-17 NOTE — Plan of Care (Signed)
 Assumed care at 1900. Pt has been resting comfortably in bed overnight. Aox4. Pt has been turned and repositioned overnight. No BM noted. Pt did active arm exercises using yellow bands overnight. Pt is motivated to get OOB and moving as he was before. No significant events overnight.    Problem: Fluid Volume: Goal: Hemodynamic stability will improve Outcome: Progressing   Problem: Clinical Measurements: Goal: Diagnostic test results will improve Outcome: Progressing Goal: Signs and symptoms of infection will decrease Outcome: Progressing   Problem: Coping: Goal: Psychosocial and spiritual needs will be supported Outcome: Progressing   Problem: Education: Goal: Ability to describe self-care measures that may prevent or decrease complications (Diabetes Survival Skills Education) will improve Outcome: Progressing Goal: Individualized Educational Video(s) Outcome: Progressing   Problem: Activity: Goal: Risk for activity intolerance will decrease Outcome: Progressing

## 2024-04-17 NOTE — TOC Progression Note (Addendum)
 Transition of Care [facility]) - Progression Note    Patient Details  Name: Earl [name] MRN: [id number] Date of Birth: Dec 13, 1940  [facility] CM/SW Contact  Dontee Jaso A [place], LCSW Phone Number: 04/17/2024, 3:11 PM  Clinical Narrative:     Update 1636 CSW met with daughter at bedside. She said that she has not followed up with [facility], but also feels adamant about pt remaining here due to needed medical care with wounds and short term rehab. She said she would provide questions for rehab and fax to CSW, tentatively by end of week versus early next week based on her availability since she is working as well. She also said that she would be open to options for home if it would be possible. CSW spoke with [facility] regarding and will discuss with daughter, Para. She asked TOC to have a follow up conversation regarding possibility for home once her document has been sent.   CSW met with pt at bedside, wanting to discuss disposition plan, pt's daughter [name] was not in the room. Pt said she left to grab some food but will be back. CSW will follow up again momentarily if time allows.    TOC will continue to follow.   Expected Discharge Plan: Skilled Nursing Facility Barriers to Discharge: Continued Medical Work up  Expected Discharge Plan and Services In-house Referral: Clinical Social Work     Living arrangements for the past 2 months:  (from [facility] short term rehab)                                       Social Determinants of Health (SDOH) Interventions SDOH Screenings   Food Insecurity: No Food Insecurity (03/05/2024)  Housing: Low Risk  (03/05/2024)  Transportation Needs: No Transportation Needs (03/05/2024)  Utilities: Not At Risk (03/05/2024)  Depression (PHQ2-9): Low Risk  (10/03/2023)  Financial Resource Strain: Patient Declined (01/16/2024)  Physical Activity: Unknown (01/16/2024)  Social Connections: Moderately Integrated (03/05/2024)  Stress: Stress  Concern Present (01/16/2024)  Tobacco Use: Low Risk  (03/04/2024)    Readmission Risk Interventions    03/19/2024   10:24 AM  Readmission Risk Prevention Plan  Transportation Screening Complete  Medication Review (RN Care Manager) Complete  PCP or Specialist appointment within 3-5 days of discharge Complete  HRI or Home Care Consult Complete  SW Recovery Care/Counseling Consult Complete  Palliative Care Screening Complete  Skilled Nursing Facility Complete

## 2024-04-17 NOTE — Plan of Care (Signed)
  Problem: Fluid Volume: Goal: Hemodynamic stability will improve Outcome: Progressing   Problem: Clinical Measurements: Goal: Diagnostic test results will improve Outcome: Progressing Goal: Signs and symptoms of infection will decrease Outcome: Progressing   Problem: Education: Goal: Knowledge of risk factors and measures for prevention of condition will improve Outcome: Progressing   Problem: Coping: Goal: Psychosocial and spiritual needs will be supported Outcome: Progressing

## 2024-04-17 NOTE — Hospital Course (Addendum)
 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 Critical care was consulted for persistent hypotension. Respiratory panel was negative. General surgery was consulted for sacral wound debridement with bedside excision on 03/05/2024 and 03/07/2024. He is now hemodynamically stable for discharge.    Assessment and Plan:   Severe sepsis sepsis - On presentation, lactic acidosis, leukocytosis, fever, secondary to infected chronic wound.  Blood cultures drawn, empiric IV fluid bolus, empiric antibiotics.  Sepsis now resolved.   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  General surgery now signed off.  Wound care following.  Completed antibiotics 5/1.  Infectious disease signed off.  Continue wound care management/regimen.  Offload pressure with every 2 hours turns, adequate nutrition.  Personally evaluated this morning, continues showing improvement with good/pink wound bed, no slough or purulence appreciated.  No tunneling appreciated.  Diameter appears improved from presentation.  Don't think medihoney is warranted at this time.  Since he's showing improvement, will continue current regimen:   Cleanse with Vashe#[id number]. Apply Aquacel Ag+ (silver hydrofiber) Lawson # [id number] in the wound bed and fill the tunneling using a swab (change daily), cover with sacral foam dressing, change daily and PRN soilage   Other pressure injuries -continue wound care, off loading, as ordered.   Going to be an ongoing struggle given pt's physical debilitation.  His heel just needs daily dressing changes if it opened up.  No need for Medihoney or more aggressive wound care at this time.   Acute on chronic pain - secondary to above -Currently on scheduled oxycontin  10mg  q12, with oxycodone  2.5mg  q4hr prn.  Pt sensitive to pain mediations, leading to increased delirium. Being very cautious about increasing dose.  Pain is tolerable and controlled at this time.  Talking with daughter at bedside, will attempt to wean OxyContin .  Given she does not come in smaller doses and cannot split it, will attempt discontinuing a.m. dose, continue OxyContin  10 mg at bedtime.  Monitor pain response closely.   Paroxysmal atrial fibrillation - Continue amiodarone  and Eliquis .  Cardiology signed off.  Continues on telemetry due to daughter's preference.   HFrEF/cardiac amyloidosis/NICM - Continues on midodrine  for hypotension (holding parameters in place).  On tafamadis for amyloid.  GDMT not in place secondary to hypotension.  Cardiology signed off for now.  Given improvement in blood pressure, attempting to wean back midodrine .  Will attempt to discontinue midodrine  today.  Monitor blood pressure closely.  Eventual goal to wean midodrine  and initiate GDMT slowly.    Insulin -dependent diabetes mellitus - Continues on glargine 7 units daily plus insulin  sliding scale.   Anemia of chronic disease - S/p 1 unit PRBC 4/30.  Hemoglobin stable, no active bleeding.   Dysphagia/severe protein calorie malnutrition - Nutrition/dietary following.  3 times daily protein calorie supplementation.  Vitamin supplementation.  Continues on pured diet.  Pt showing improvement in appetite.  Ideally would benefit from advancement of diet if possible per SLP.   Physical debilitation of muscle weakness - PT/OT on board.  Pt is motivated.  However, limited ability  to sit upright in chair secondary to pain from sacral ulcer.   Continue to encourage participation.  Pain appears to be limiting factor as well as overall muscle strength and weakness.   Goals of care - Patient is medically appropriate for discharge at this time.  Working closely with TOC family on disposition planning.  Patient's daughter wants to be absolutely sure wound is healing prior to transition out of the hospital.  However given his physical ability, STR or fulltime care would likely be required.   Discussion today 5/31 about goals and disposition.  Ideally her goal is to have his sacral wound nearly completely healed prior to discharge being a stage I or small stage II.  Given his rehospitalization secondary to sepsis from infected sacral ulcer, she is hesitant to transition to STR where care may be limited and recurrence of infection becomes more likely.  This is understandable and agreeable from my standpoint.  As his ulcer improves his pain will improve, as pain improves his PT will improve.  There is frank understanding that patient will likely not rebound to his previous independence however would like to optimize his health is much as possible prior to transition to PT rehab.

## 2024-04-17 NOTE — Progress Notes (Signed)
 Progress Note   Patient: Earl [name] ZOX:[id number] DOB: December 26, 1940 DOA: 03/04/2024     44 DOS: the patient was seen and examined on 04/17/2024   Brief hospital course:  83 yrs old male with past medical history of paroxysmal atrial fibrillation on Eliquis , insulin -dependent diabetes mellitus type 2, essential hypertension, CVA with right residual weakness, MGUS, BPH, HFr EF of 40%, He was discharged, recently from [facility] after admitted for acute metabolic encephalopathy, rhabdomyolysis and orthostatic, chronic pancytopenia presents to the ED from skilled nursing facility for shortness of breath, sacral pain, In the ED, he was found to be in RVR, tachypneic with saturations greater than 90%. Critical care was consulted for persistent hypotension. Respiratory panel was negative. General surgery was consulted for sacral wound debridement with bedside excision on 03/05/2024 and 03/07/2024. He is now hemodynamically stable for discharge.   Assessment and Plan:  Severe sepsis sepsis - On presentation, lactic acidosis, leukocytosis, fever, secondary to infected chronic wound.  Blood cultures drawn, empiric IV fluid bolus, empiric antibiotics.  Sepsis now resolved.  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  General surgery now signed off.  Wound care following.  Completed antibiotics 5/1.  Infectious disease signed off.  Continue wound care management/regimen.  Offload pressure with every 2 hours turns, adequate nutrition.  Paroxysmal atrial fibrillation - Continue amiodarone  and Eliquis .  Cardiology signed off.  Continues on telemetry due to daughter's preference.  HFrEF/cardiac amyloidosis/NICM - Continues on midodrine  for hypotension (holding parameters in place).  On tafamadis for amyloid.  GDMT not in place secondary to hypotension.  Cardiology signed off for now.  Insulin -dependent diabetes mellitus - Continues on glargine plus  insulin  sliding scale.  Anemia of chronic disease - S/p 1 unit PRBC 4/30.  Hemoglobin stable, no active bleeding.  Dysphagia/severe protein calorie malnutrition - Nutrition/dietary following.  3 times daily protein calorie supplementation.  Vitamin supplementation.  Continues on pured diet.  Physical debilitation of muscle weakness - PT/OT on board.  Goals of care - Patient is medically appropriate for discharge at this time.  Working closely with TOC family on disposition planning.  Reports that family/daughter wants to be absolutely sure wound is healing prior to transition out of the hospital.   Subjective: Patient resting comfortably this morning.  Still complaining of back pain which is to be expected.  In good spirits.  Denies shortness of breath, fever, chills, nausea, vomiting.  Physical Exam: Vitals:   04/17/24 0500 04/17/24 0548 04/17/24 0840 04/17/24 0850  BP:  114/60 118/60 130/66  Pulse:  73  76  Resp:  16  18  Temp:  97.9 F (36.6 C)  98 F (36.7 C)  TempSrc:    Oral  SpO2:  100%  100%  Weight: 77.1 kg     Height:        GENERAL:  Alert, pleasant, no acute distress, frail HEENT:  EOMI CARDIOVASCULAR:  RRR, no murmurs appreciated RESPIRATORY:  Clear to auscultation, no wheezing, rales, or rhonchi GASTROINTESTINAL:  Soft, nontender, nondistended EXTREMITIES:  No LE edema bilaterally NEURO:  No new focal deficits appreciated SKIN: Bandage over sacrum PSYCH:  Appropriate mood and affect     Data Reviewed:  No new imaging to review  Previous records (including but not limited to H&P, progress notes, nursing notes, TOC management) were reviewed in assessment of this patient.  Labs: CBC: Recent Labs  Lab 04/14/24 0737 04/17/24 0612  WBC 6.0 6.2  HGB 9.9* 10.0*  HCT 30.6* 31.1*  MCV 87.2 88.1  PLT 287 305   Basic Metabolic Panel: Recent Labs  Lab 04/14/24 0737 04/17/24 0612  NA 139 141  K 3.7 3.9  CL 105 107  CO2 26 26  GLUCOSE 96 146*   BUN 27* 30*  CREATININE 0.86 0.78  CALCIUM  8.0* 8.2*   Liver Function Tests: No results for input(s): "AST", "ALT", "ALKPHOS", "BILITOT", "PROT", "ALBUMIN " in the last 168 hours. CBG: Recent Labs  Lab 04/16/24 0821 04/16/24 1204 04/16/24 1647 04/16/24 2012 04/17/24 0840  GLUCAP 114* 167* 137* 193* 136*    Scheduled Meds:  acetaminophen   500 mg Oral TID   amiodarone   200 mg Oral Daily   apixaban   5 mg Oral BID   atorvastatin   40 mg Oral Daily   Chlorhexidine  Gluconate Cloth  6 each Topical Daily   collagenase    Topical Daily   diclofenac  Sodium  4 g Topical QID   feeding supplement  237 mL Oral TID BM   ferrous sulfate   325 mg Oral QHS   furosemide   20 mg Oral Daily   guaiFENesin   600 mg Oral BID   insulin  aspart  0-15 Units Subcutaneous TID WC   insulin  aspart  0-5 Units Subcutaneous QHS   insulin  aspart  4 Units Subcutaneous TID WC   insulin  glargine-yfgn  7 Units Subcutaneous Daily   levothyroxine   50 mcg Oral Q0600   liver oil-zinc  oxide   Topical BID   midodrine   5 mg Oral TID WC   multivitamin with minerals  1 tablet Oral Daily   nutrition supplement (JUVEN)  1 packet Oral BID BM   mouth rinse  15 mL Mouth Rinse 4 times per day   oxyCODONE   10 mg Oral Q12H   phosphorus  500 mg Oral TID   polyethylene glycol  17 g Oral BID   senna-docusate  1 tablet Oral QHS   sodium chloride  flush  3 mL Intravenous Q12H   Tafamidis   1 capsule Oral Daily   Continuous Infusions: PRN Meds:.acetaminophen  **OR** acetaminophen , food thickener, lip balm, morphine  injection, ondansetron  **OR** ondansetron  (ZOFRAN ) IV, mouth rinse, oxyCODONE , polyethylene glycol, polyvinyl alcohol , sodium chloride  flush  Family Communication: None at bedside  Disposition: Status is: Inpatient Remains inpatient appropriate because: Disposition planning     Time spent: 34 minutes  Length of stay: 44 days  Author: Jodeane Mulligan, DO 04/17/2024 11:14 AM  For on call review  [email or web address].

## 2024-04-18 DIAGNOSIS — A419 Sepsis, unspecified organism: Secondary | ICD-10-CM | POA: Diagnosis not present

## 2024-04-18 DIAGNOSIS — L98429 Non-pressure chronic ulcer of back with unspecified severity: Secondary | ICD-10-CM | POA: Diagnosis not present

## 2024-04-18 DIAGNOSIS — I483 Typical atrial flutter: Secondary | ICD-10-CM | POA: Diagnosis not present

## 2024-04-18 DIAGNOSIS — I5022 Chronic systolic (congestive) heart failure: Secondary | ICD-10-CM | POA: Diagnosis not present

## 2024-04-18 LAB — CBC
HCT: 31.1 % — ABNORMAL LOW (ref 39.0–52.0)
Hemoglobin: 10.1 g/dL — ABNORMAL LOW (ref 13.0–17.0)
MCH: 28.1 pg (ref 26.0–34.0)
MCHC: 32.5 g/dL (ref 30.0–36.0)
MCV: 86.6 fL (ref 80.0–100.0)
Platelets: 302 10*3/uL (ref 150–400)
RBC: 3.59 MIL/uL — ABNORMAL LOW (ref 4.22–5.81)
RDW: 16.3 % — ABNORMAL HIGH (ref 11.5–15.5)
WBC: 5.8 10*3/uL (ref 4.0–10.5)
nRBC: 0 % (ref 0.0–0.2)

## 2024-04-18 LAB — BASIC METABOLIC PANEL WITH GFR
Anion gap: 8 (ref 5–15)
BUN: 31 mg/dL — ABNORMAL HIGH (ref 8–23)
CO2: 26 mmol/L (ref 22–32)
Calcium: 8.4 mg/dL — ABNORMAL LOW (ref 8.9–10.3)
Chloride: 105 mmol/L (ref 98–111)
Creatinine, Ser: 0.8 mg/dL (ref 0.61–1.24)
GFR, Estimated: >60 mL/min (ref >60)
Glucose, Bld: 140 mg/dL — ABNORMAL HIGH (ref 70–99)
Potassium: 4.1 mmol/L (ref 3.5–5.1)
Sodium: 139 mmol/L (ref 135–145)

## 2024-04-18 LAB — GLUCOSE, CAPILLARY
Glucose-Capillary: 133 mg/dL — ABNORMAL HIGH (ref 70–99)
Glucose-Capillary: 135 mg/dL — ABNORMAL HIGH (ref 70–99)
Glucose-Capillary: 154 mg/dL — ABNORMAL HIGH (ref 70–99)
Glucose-Capillary: 93 mg/dL (ref 70–99)

## 2024-04-18 LAB — MAGNESIUM: Magnesium: 1.8 mg/dL (ref 1.7–2.4)

## 2024-04-18 MED ORDER — MIDODRINE HCL 5 MG PO TABS
2.5000 mg | ORAL_TABLET | Freq: Three times a day (TID) | ORAL | Status: DC
  Administered 2024-04-18 – 2024-04-20 (×6): 2.5 mg via ORAL
  Filled 2024-04-18 (×6): qty 1

## 2024-04-18 NOTE — Progress Notes (Signed)
 Progress Note   Patient: Earl [name] UVO:[id number] DOB: 1941/03/13 DOA: 03/04/2024     45 DOS: the patient was seen and examined on 04/18/2024   Brief hospital course:  83 yrs old male with past medical history of paroxysmal atrial fibrillation on Eliquis , insulin -dependent diabetes mellitus type 2, essential hypertension, CVA with right residual weakness, MGUS, BPH, HFr EF of 40%, He was discharged, recently from [facility] after admitted for acute metabolic encephalopathy, rhabdomyolysis and orthostatic, chronic pancytopenia presents to the ED from skilled nursing facility for shortness of breath, sacral pain, In the ED, he was found to be in RVR, tachypneic with saturations greater than 90%. Critical care was consulted for persistent hypotension. Respiratory panel was negative. General surgery was consulted for sacral wound debridement with bedside excision on 03/05/2024 and 03/07/2024. He is now hemodynamically stable for discharge.    Assessment and Plan:   Severe sepsis sepsis - On presentation, lactic acidosis, leukocytosis, fever, secondary to infected chronic wound.  Blood cultures drawn, empiric IV fluid bolus, empiric antibiotics.  Sepsis now resolved.   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  General surgery now signed off.  Wound care following.  Completed antibiotics 5/1.  Infectious disease signed off.  Continue wound care management/regimen.  Offload pressure with every 2 hours turns, adequate nutrition.  Per wound pictures from yesterday 5/28, showing improvement with good/pink wound bed, no slough or purulence appreciated.  Don't think medihoney is needed.  Since he's showing improvement, will continue current regimen:  Cleanse with Vashe#[id number]. Apply Aquacel Ag+ (silver hydrofiber) Lawson # [id number] in the wound bed and fill the tunneling using a swab (change daily), cover with sacral foam dressing, change daily and PRN  soilage   Other pressure injuries -continue wound care, off loading, as ordered.    Acute on chronic pain - secondary to above -Currently on scheduled oxycontin  10mg  q12, with oxycodone  2.5mg  q4hr prn.  Pt sensitive to pain mediations, leading to increased delirium. Being very cautious about increasing dose.  Pain is tolerable and controlled at this time.    Paroxysmal atrial fibrillation - Continue amiodarone  and Eliquis .  Cardiology signed off.  Continues on telemetry due to daughter's preference.   HFrEF/cardiac amyloidosis/NICM - Continues on midodrine  for hypotension (holding parameters in place).  On tafamadis for amyloid.  GDMT not in place secondary to hypotension.  Cardiology signed off for now.  Given improvement in blood pressure, will attempt to wean back midodrine  to 2.5mg  TID.  Monitor blood pressure closely.  Eventual goal to wean midodrine  and initiate GDMT slowly.    Insulin -dependent diabetes mellitus - Continues on glargine 7 units daily plus insulin  sliding scale.   Anemia of chronic disease - S/p 1 unit PRBC 4/30.  Hemoglobin stable, no active bleeding.   Dysphagia/severe protein calorie malnutrition - Nutrition/dietary following.  3 times daily protein calorie supplementation.  Vitamin supplementation.  Continues on pured diet.  Pt showing improvement in appetite.    Physical debilitation of muscle weakness - PT/OT on board.   Goals of care - Patient is medically appropriate for discharge at this time.  Working closely with TOC family on disposition planning.  Reports that family/daughter wants to be absolutely sure wound is healing prior to transition out of the hospital.   Subjective: Patient resting comfortably this morning, eating breakfast. Still complaining of back pain which is to be expected. Had been turned and moved this morning agitating his back pain, but otherwise feels  the pain is under control.  Denies shortness of breath, fever, chills, nausea,  vomiting.   Was able to talk with his daughter at length yesterday about his plan and answered her questions/concerns.   Physical Exam: Vitals:   04/17/24 2332 04/18/24 0500 04/18/24 0517 04/18/24 0807  BP: 117/75  119/79 123/70  Pulse: 80  80 76  Resp: 16  16 17   Temp: 97.9 F (36.6 C)  98.5 F (36.9 C) 97.8 F (36.6 C)  TempSrc: Oral  Oral   SpO2: 100%  100% 100%  Weight:  78.5 kg    Height:        GENERAL:  Alert, pleasant, no acute distress, frail HEENT:  EOMI CARDIOVASCULAR:  RRR, no murmurs appreciated RESPIRATORY:  Clear to auscultation, no wheezing, rales, or rhonchi GASTROINTESTINAL:  Soft, nontender, nondistended EXTREMITIES:  No LE edema bilaterally NEURO:  No new focal deficits appreciated SKIN: Bandage over sacrum PSYCH:  Appropriate mood and affect    Data Reviewed:  Imaging/pictures of wounds reviewed, as above  Previous records (including but not limited to H&P, progress notes, nursing notes, TOC management) were reviewed in assessment of this patient.  Labs: CBC: Recent Labs  Lab 04/14/24 0737 04/17/24 0612 04/18/24 0728  WBC 6.0 6.2 5.8  HGB 9.9* 10.0* 10.1*  HCT 30.6* 31.1* 31.1*  MCV 87.2 88.1 86.6  PLT 287 305 302   Basic Metabolic Panel: Recent Labs  Lab 04/14/24 0737 04/17/24 0612 04/18/24 0728  NA 139 141 139  K 3.7 3.9 4.1  CL 105 107 105  CO2 26 26 26   GLUCOSE 96 146* 140*  BUN 27* 30* 31*  CREATININE 0.86 0.78 0.80  CALCIUM  8.0* 8.2* 8.4*  MG  --   --  1.8   Liver Function Tests: No results for input(s): "AST", "ALT", "ALKPHOS", "BILITOT", "PROT", "ALBUMIN " in the last 168 hours. CBG: Recent Labs  Lab 04/17/24 0840 04/17/24 1250 04/17/24 1642 04/17/24 2050 04/18/24 0802  GLUCAP 136* 129* 123* 153* 133*    Scheduled Meds:  acetaminophen   500 mg Oral TID   amiodarone   200 mg Oral Daily   apixaban   5 mg Oral BID   atorvastatin   40 mg Oral Daily   Chlorhexidine  Gluconate Cloth  6 each Topical Daily    collagenase    Topical Daily   diclofenac  Sodium  4 g Topical QID   feeding supplement  237 mL Oral TID BM   ferrous sulfate   325 mg Oral QHS   furosemide   20 mg Oral Daily   guaiFENesin   600 mg Oral BID   insulin  aspart  0-15 Units Subcutaneous TID WC   insulin  aspart  0-5 Units Subcutaneous QHS   insulin  aspart  4 Units Subcutaneous TID WC   insulin  glargine-yfgn  7 Units Subcutaneous Daily   levothyroxine   50 mcg Oral Q0600   liver oil-zinc  oxide   Topical BID   midodrine   5 mg Oral TID WC   multivitamin with minerals  1 tablet Oral Daily   nutrition supplement (JUVEN)  1 packet Oral BID BM   mouth rinse  15 mL Mouth Rinse 4 times per day   oxyCODONE   10 mg Oral Q12H   phosphorus  500 mg Oral TID   polyethylene glycol  17 g Oral BID   senna-docusate  1 tablet Oral QHS   sodium chloride  flush  3 mL Intravenous Q12H   Tafamidis   1 capsule Oral Daily   Continuous Infusions: PRN Meds:.acetaminophen  **OR** acetaminophen , food thickener,  lip balm, morphine  injection, ondansetron  **OR** ondansetron  (ZOFRAN ) IV, mouth rinse, oxyCODONE , polyethylene glycol, polyvinyl alcohol , sodium chloride  flush  Family Communication: none at bedside this am.  Disposition: Status is: Inpatient Remains inpatient appropriate because: disposition     Time spent: 37 minutes  Length of stay: 45 days  Author: Jodeane Mulligan, DO 04/18/2024 10:37 AM  For on call review [email or web address].

## 2024-04-18 NOTE — Progress Notes (Signed)
 Speech Language Pathology Treatment: Dysphagia  Patient Details Name: Earl [name] MRN: [id number] DOB: 05-09-1941 Today's Date: 04/18/2024 Time: 1206-     Assessment / Plan / Recommendation Clinical Impression  Earl [name] continues to tolerate a dysphagia 1 diet with thin liquids well. Today he was interactive and making jokes. He presented with no s/s of aspiration during our session.  Respiratory status has been stable; he has not needed chest imaging since earlier this month.   When asked about his pureed diet, he maintains that he prefers to stay on this diet for now.  He is stable on dysphagia 1/thins.  There are no further acute care SLP needs.  When he expresses a desire to try other foods, would certainly allow him the opportunity.  Our service will sign off at this time.    HPI HPI: Patient is an 83 y.o. 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 He was discharged from the hospital (admitted for acute metabolic encephalopathy, hypotensive, hypoglycemic) to [facility] for rehab. He presented back to the hospital four days later on 03/04/24 with SOB and pain in sacral area. 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 SLP ordered due to suspicious for dysphagia.      SLP Plan  All goals met      Recommendations for follow up therapy are one component of a multi-disciplinary discharge planning process, led by the attending physician.  Recommendations may be updated based on patient status, additional functional criteria and insurance authorization.    Recommendations  Diet recommendations: Thin liquid;Dysphagia 1 (puree) Liquids provided via: Cup;Straw Medication Administration: Crushed with puree Supervision: Staff to assist with  self feeding Compensations: Small sips/bites;Slow rate Postural Changes and/or Swallow Maneuvers: Seated upright 90 degrees                  Oral care BID   Frequent or constant Supervision/Assistance Dysphagia, unspecified (R13.10)     All goals met    Earl [name] L. Earl Lincoln, MA CCC/SLP Clinical Specialist - Acute Care SLP Acute Rehabilitation Services Office number [phone]  Earl [name] Earl [name]  04/18/2024, 1:43 PM

## 2024-04-18 NOTE — Plan of Care (Signed)
  Problem: Fluid Volume: Goal: Hemodynamic stability will improve Outcome: Progressing   Problem: Clinical Measurements: Goal: Diagnostic test results will improve Outcome: Progressing Goal: Signs and symptoms of infection will decrease Outcome: Progressing   Problem: Respiratory: Goal: Ability to maintain adequate ventilation will improve Outcome: Progressing   Problem: Education: Goal: Knowledge of risk factors and measures for prevention of condition will improve Outcome: Progressing   Problem: Coping: Goal: Psychosocial and spiritual needs will be supported Outcome: Progressing   Problem: Respiratory: Goal: Will maintain a patent airway Outcome: Progressing Goal: Complications related to the disease process, condition or treatment will be avoided or minimized Outcome: Progressing   Problem: Education: Goal: Ability to describe self-care measures that may prevent or decrease complications (Diabetes Survival Skills Education) will improve Outcome: Progressing Goal: Individualized Educational Video(s) Outcome: Progressing   Problem: Coping: Goal: Ability to adjust to condition or change in health will improve Outcome: Progressing   Problem: Fluid Volume: Goal: Ability to maintain a balanced intake and output will improve Outcome: Progressing   Problem: Health Behavior/Discharge Planning: Goal: Ability to identify and utilize available resources and services will improve Outcome: Progressing Goal: Ability to manage health-related needs will improve Outcome: Progressing   Problem: Metabolic: Goal: Ability to maintain appropriate glucose levels will improve Outcome: Progressing   Problem: Nutritional: Goal: Maintenance of adequate nutrition will improve Outcome: Progressing Goal: Progress toward achieving an optimal weight will improve Outcome: Progressing   Problem: Skin Integrity: Goal: Risk for impaired skin integrity will decrease Outcome: Progressing    Problem: Tissue Perfusion: Goal: Adequacy of tissue perfusion will improve Outcome: Progressing   Problem: Education: Goal: Knowledge of General Education information will improve Description: Including pain rating scale, medication(s)/side effects and non-pharmacologic comfort measures Outcome: Progressing   Problem: Health Behavior/Discharge Planning: Goal: Ability to manage health-related needs will improve Outcome: Progressing   Problem: Clinical Measurements: Goal: Ability to maintain clinical measurements within normal limits will improve Outcome: Progressing Goal: Will remain free from infection Outcome: Progressing Goal: Diagnostic test results will improve Outcome: Progressing Goal: Respiratory complications will improve Outcome: Progressing Goal: Cardiovascular complication will be avoided Outcome: Progressing   Problem: Activity: Goal: Risk for activity intolerance will decrease Outcome: Progressing   Problem: Nutrition: Goal: Adequate nutrition will be maintained Outcome: Progressing   Problem: Coping: Goal: Level of anxiety will decrease Outcome: Progressing   Problem: Elimination: Goal: Will not experience complications related to bowel motility Outcome: Progressing Goal: Will not experience complications related to urinary retention Outcome: Progressing   Problem: Pain Managment: Goal: General experience of comfort will improve and/or be controlled Outcome: Progressing   Problem: Safety: Goal: Ability to remain free from injury will improve Outcome: Progressing   Problem: Skin Integrity: Goal: Risk for impaired skin integrity will decrease Outcome: Progressing

## 2024-04-18 NOTE — Progress Notes (Signed)
 Physical Therapy Treatment Patient Details Name: Earl [name] MRN: [id number] DOB: [birth year] Today's Date: 04/18/2024   History of Present Illness 83 y.o. male admitted from [facility] SNF rehab on 03/04/24 with SOB, sacral pain, Afib with RVR. Pt with acute metabolic encephalopathy, rhabdomyolysis and sepsis secondary to sacral wound infection. 4/15 & 4/17 bedside sacral wound debridement. PMhx: adm 3/26-4/08/2024 with fall, AMS, COVID; d/c to SNF rehab. CVA (2021, residual R-side weakness), T2DM, HTN, CHF, MGUS, Afib on Eliquis .    PT Comments  Pt received in supine, agreeable to therapy session and with fair participation and tolerance for AAROM in supine and sidelying, and modA to roll to his R side in air bed. Once rolled to side, pt c/o increased pain although had previously refused premedication by RN prior to session. RN called to room to assess sacral dressing due to drainage and to discuss pain medication with him, pt agreeable to work on seated/standing trials after RN assists him with dressing changes, PTA to return after pt pain more under control later in the day. Patient will benefit from continued inpatient follow up therapy, <3 hours/day.    If plan is discharge home, recommend the following: Two people to help with walking and/or transfers;Two people to help with bathing/dressing/bathroom;Assistance with feeding;Assist for transportation   Can travel by private vehicle     No  Equipment Recommendations  Hospital bed;Hoyer lift    Recommendations for Other Services       Precautions / Restrictions Precautions Precautions: Fall;Other (comment) Recall of Precautions/Restrictions: Intact Precaution/Restrictions Comments: significant pain from sacral wound; b/b incontinence Restrictions Weight Bearing Restrictions Per Provider Order: No     Mobility  Bed Mobility Overal bed mobility: Needs Assistance Bed Mobility: Rolling Rolling: Used rails, Max assist          General bed mobility comments: rolling to his R side, pt not neeing peri-care but dressing has some drainage adn may need to be changed, RN called to room to assess sacral foam and heel dressings/shoulder dressing and pt needing pain meds prior to getting up.    Transfers Overall transfer level: Needs assistance                 General transfer comment: not attempted, pt now agreeable to premedication before transfer to EOB so plan to return for this later.    Ambulation/Gait                   Stairs             Wheelchair Mobility     Tilt Bed    Modified Rankin (Stroke Patients Only)       Balance Overall balance assessment: Needs assistance                                          Communication Communication Communication: Impaired Factors Affecting Communication: Reduced clarity of speech  Cognition Arousal: Alert Behavior During Therapy: WFL for tasks assessed/performed, Lability   PT - Cognitive impairments: Problem solving, Sequencing, Memory, Safety/Judgement                       PT - Cognition Comments: pt pleasant and willing, slow processing. Pt initially reluctant to agree to premedication wtih RN, but when pt assisted to begin mobilizing, he becomes more labile and RN called to room with pt eventually  agreeing to try taking some pain meds prior to working on transfers. Following commands: Impaired Following commands impaired: Follows one step commands with increased time    Cueing Cueing Techniques: Verbal cues, Tactile cues, Gestural cues  Exercises General Exercises - Lower Extremity Short Arc Quad: AAROM, Both, 5 reps, Sidelying Heel Slides: AAROM, Both, 5 reps, Supine Other Exercises Other Exercises: sidelying shoulder flexion/extension x3 reps ea and cues for reaching up to rail    General Comments General comments (skin integrity, edema, etc.): sacral foam has drainage and is dated previous  date, RN notified and arriving to assess at end of session      Pertinent Vitals/Pain Pain Assessment Pain Assessment: Faces Faces Pain Scale: Hurts whole lot Pain Location: sacrum, BLEs with rolling Pain Descriptors / Indicators: Aching, Discomfort, Grimacing, Moaning Pain Intervention(s): Limited activity within patient's tolerance, Monitored during session, Repositioned, Patient requesting pain meds-RN notified    Home Living                          Prior Function            PT Goals (current goals can now be found in the care plan section) Acute Rehab PT Goals Patient Stated Goal: To improve strength and mobility to reduce risk for falls. PT Goal Formulation: With patient Time For Goal Achievement: 04/25/24 Progress towards PT goals: Progressing toward goals (slowly)    Frequency    Min 1X/week      PT Plan      Co-evaluation              AM-PAC PT "6 Clicks" Mobility   Outcome Measure  Help needed turning from your back to your side while in a flat bed without using bedrails?: A Lot Help needed moving from lying on your back to sitting on the side of a flat bed without using bedrails?: Total Help needed moving to and from a bed to a chair (including a wheelchair)?: Total Help needed standing up from a chair using your arms (e.g., wheelchair or bedside chair)?: Total Help needed to walk in hospital room?: Total Help needed climbing 3-5 steps with a railing? : Total 6 Click Score: 7    End of Session Equipment Utilized During Treatment: Other (comment) (bed pads) Activity Tolerance: Patient limited by pain Patient left: in bed;with call bell/phone within reach;with bed alarm set;Other (comment);with nursing/sitter in room (sidelying to his R, RN arriving) Nurse Communication: Mobility status;Need for lift equipment;Precautions;Other (comment) (sacral foam needs change) PT Visit Diagnosis: Muscle weakness (generalized) (M62.81);Adult, failure to  thrive (R62.7);Difficulty in walking, not elsewhere classified (R26.2);Pain;Other abnormalities of gait and mobility (R26.89) Pain - part of body:  (sacrum/bottom)     Time: 1610-9604 PT Time Calculation (min) (ACUTE ONLY): 8 min  Charges:    $Therapeutic Activity: 8-22 mins PT General Charges $$ ACUTE PT VISIT: 1 Visit                     Sailor Hevia P., PTA Acute Rehabilitation Services Secure Chat Preferred 9a-5:30pm Office: [phone]    Mariel Shope [facility] 04/18/2024, 4:08 PM

## 2024-04-18 NOTE — Progress Notes (Signed)
 Physical Therapy Treatment Patient Details Name: Earl [name] MRN: [id number] DOB: [birth year] Today's Date: 04/18/2024   History of Present Illness 83 y.o. male admitted from [facility] SNF rehab on 03/04/24 with SOB, sacral pain, Afib with RVR. Pt with acute metabolic encephalopathy, rhabdomyolysis and sepsis secondary to sacral wound infection. 4/15 & 4/17 bedside sacral wound debridement. PMhx: adm 3/26-4/08/2024 with fall, AMS, COVID; d/c to SNF rehab. CVA (2021, residual R-side weakness), T2DM, HTN, CHF, MGUS, Afib on Eliquis .    PT Comments  Pt received in sidelying after premedication for pain by RN and dressing change, pt now agreeable to attempt standing after initially being reluctant. Pt performed STS x 4 trials with Stedy platform but despite dense multimodal cues and +2 maxA, pt not able to reach full upright stance or extend hips enough to safely place Stedy flaps. Had planned to try having pt sit up in chair but pt more fatigued after standing trials so RN notified he would need hoyer OOB to chair if pt agreeable later in the day or tomorrow if time allows. Patient will benefit from continued inpatient follow up therapy, <3 hours/day.     If plan is discharge home, recommend the following: Two people to help with walking and/or transfers;Two people to help with bathing/dressing/bathroom;Assistance with feeding;Assist for transportation   Can travel by private vehicle     No  Equipment Recommendations  Hospital bed;Hoyer lift    Recommendations for Other Services       Precautions / Restrictions Precautions Precautions: Fall;Other (comment) Recall of Precautions/Restrictions: Intact Precaution/Restrictions Comments: significant pain from sacral wound; b/b incontinence Restrictions Weight Bearing Restrictions Per Provider Order: No     Mobility  Bed Mobility Overal bed mobility: Needs Assistance Bed Mobility: Rolling Rolling: Used rails, Max assist Sidelying to  sit: Used rails, +2 for physical assistance, Mod assist, HOB elevated     Sit to sidelying: Max assist, +2 for physical assistance General bed mobility comments: Pt already sidelying to his R and performed log roll to sit up on R EOB with multimodal cues to sequence UE/LE movements. Posterior lean upon achieving upright, pt using BUE support.    Transfers Overall transfer level: Needs assistance Equipment used: Ambulation equipment used Transfers: Sit to/from Stand Sit to Stand: Max assist, +2 physical assistance, From elevated surface, Via lift equipment           General transfer comment: elevated bed<>Stedy x4 trials, pt standing 50-75% upright but not able to fully extend knees and trunk today despite max cues and heavy lift assist via bed pad/gait belt so defer Stedy OOB to chair.    Ambulation/Gait             Pre-gait activities: pt unable to weight shift in stedy platform today, not fully extending knees/hips     Stairs             Wheelchair Mobility     Tilt Bed    Modified Rankin (Stroke Patients Only)       Balance Overall balance assessment: Needs assistance Sitting-balance support: Bilateral upper extremity supported, Feet supported Sitting balance-Leahy Scale: Poor Sitting balance - Comments: posterior Rt lean in sitting, more midline posture wtih BUE supported by Stedy rail for ~5 mins Postural control: Posterior lean   Standing balance-Leahy Scale: Zero Standing balance comment: max +2 assist not fully extended  Communication Communication Communication: Impaired Factors Affecting Communication: Reduced clarity of speech  Cognition Arousal: Alert Behavior During Therapy: WFL for tasks assessed/performed, Lability   PT - Cognitive impairments: Problem solving, Sequencing, Memory, Safety/Judgement                       PT - Cognition Comments: pt pleasant and willing, slow processing.  some anxiety prior to EOB/standing but pt tolerated well. Following commands: Impaired Following commands impaired: Follows one step commands with increased time    Cueing Cueing Techniques: Verbal cues, Tactile cues, Gestural cues  Exercises General Exercises - Lower Extremity Short Arc Quad: AAROM, Both, 5 reps, Sidelying Heel Slides: AAROM, Both, 5 reps, Supine Other Exercises Other Exercises: working midline sitting with safe UE placement on bed rails then Stedy rail, pt tolerates EOB >8 mins total including rest breaks between standing trials.    General Comments General comments (skin integrity, edema, etc.): RN changed dressings prior to session      Pertinent Vitals/Pain Pain Assessment Pain Assessment: Faces Faces Pain Scale: Hurts even more Pain Location: sacrum, bottom and generalized Pain Descriptors / Indicators: Aching, Discomfort, Grimacing Pain Intervention(s): Limited activity within patient's tolerance, Monitored during session, Premedicated before session, Repositioned    Home Living                          Prior Function            PT Goals (current goals can now be found in the care plan section) Acute Rehab PT Goals Patient Stated Goal: To improve strength and mobility to reduce risk for falls. PT Goal Formulation: With patient Time For Goal Achievement: 04/25/24 Progress towards PT goals: Progressing toward goals    Frequency    Min 1X/week      PT Plan      Co-evaluation              AM-PAC PT "6 Clicks" Mobility   Outcome Measure  Help needed turning from your back to your side while in a flat bed without using bedrails?: A Lot Help needed moving from lying on your back to sitting on the side of a flat bed without using bedrails?: Total Help needed moving to and from a bed to a chair (including a wheelchair)?: Total Help needed standing up from a chair using your arms (e.g., wheelchair or bedside chair)?: Total Help  needed to walk in hospital room?: Total Help needed climbing 3-5 steps with a railing? : Total 6 Click Score: 7    End of Session Equipment Utilized During Treatment: Other (comment) (bed pads) Activity Tolerance: Patient tolerated treatment well;Patient limited by fatigue Patient left: in bed;with call bell/phone within reach;with bed alarm set;Other (comment);with nursing/sitter in room (sidelying to his R, pillows placed between his knees/ankles, under R shoulder, and LUE supported well by pillows for comfort to try to allow him to stay on his side for a while) Nurse Communication: Mobility status;Need for lift equipment;Precautions;Other (comment) (pt wants to roll to his back in likely 30 mins or fewer, RN going to lunch PTA asking her to check on him after to reposition) PT Visit Diagnosis: Muscle weakness (generalized) (M62.81);Adult, failure to thrive (R62.7);Difficulty in walking, not elsewhere classified (R26.2);Pain;Other abnormalities of gait and mobility (R26.89) Pain - part of body:  (sacrum/bottom)     Time: 0981-1914 PT Time Calculation (min) (ACUTE ONLY): 19 min  Charges:    $Therapeutic Activity:  8-22 mins PT General Charges $$ ACUTE PT VISIT: 1 Visit                     Whitt Auletta P., PTA Acute Rehabilitation Services Secure Chat Preferred 9a-5:30pm Office: [phone]    Mariel Shope [facility] 04/18/2024, 4:21 PM

## 2024-04-19 DIAGNOSIS — A419 Sepsis, unspecified organism: Secondary | ICD-10-CM | POA: Diagnosis not present

## 2024-04-19 DIAGNOSIS — L98429 Non-pressure chronic ulcer of back with unspecified severity: Secondary | ICD-10-CM | POA: Diagnosis not present

## 2024-04-19 DIAGNOSIS — E43 Unspecified severe protein-calorie malnutrition: Secondary | ICD-10-CM | POA: Diagnosis not present

## 2024-04-19 DIAGNOSIS — R6520 Severe sepsis without septic shock: Secondary | ICD-10-CM | POA: Diagnosis not present

## 2024-04-19 LAB — CBC
HCT: 32 % — ABNORMAL LOW (ref 39.0–52.0)
Hemoglobin: 10.2 g/dL — ABNORMAL LOW (ref 13.0–17.0)
MCH: 27.9 pg (ref 26.0–34.0)
MCHC: 31.9 g/dL (ref 30.0–36.0)
MCV: 87.7 fL (ref 80.0–100.0)
Platelets: 310 10*3/uL (ref 150–400)
RBC: 3.65 MIL/uL — ABNORMAL LOW (ref 4.22–5.81)
RDW: 16.3 % — ABNORMAL HIGH (ref 11.5–15.5)
WBC: 6.1 10*3/uL (ref 4.0–10.5)
nRBC: 0 % (ref 0.0–0.2)

## 2024-04-19 LAB — BASIC METABOLIC PANEL WITH GFR
Anion gap: 7 (ref 5–15)
BUN: 30 mg/dL — ABNORMAL HIGH (ref 8–23)
CO2: 24 mmol/L (ref 22–32)
Calcium: 8.4 mg/dL — ABNORMAL LOW (ref 8.9–10.3)
Chloride: 108 mmol/L (ref 98–111)
Creatinine, Ser: 0.79 mg/dL (ref 0.61–1.24)
GFR, Estimated: >60 mL/min (ref >60)
Glucose, Bld: 117 mg/dL — ABNORMAL HIGH (ref 70–99)
Potassium: 4 mmol/L (ref 3.5–5.1)
Sodium: 139 mmol/L (ref 135–145)

## 2024-04-19 LAB — GLUCOSE, CAPILLARY
Glucose-Capillary: 121 mg/dL — ABNORMAL HIGH (ref 70–99)
Glucose-Capillary: 226 mg/dL — ABNORMAL HIGH (ref 70–99)
Glucose-Capillary: 130 mg/dL — ABNORMAL HIGH (ref 70–99)
Glucose-Capillary: 164 mg/dL — ABNORMAL HIGH (ref 70–99)

## 2024-04-19 LAB — PHOSPHORUS: Phosphorus: 3.9 mg/dL (ref 2.5–4.6)

## 2024-04-19 LAB — MAGNESIUM: Magnesium: 1.9 mg/dL (ref 1.7–2.4)

## 2024-04-19 NOTE — Plan of Care (Signed)
  Problem: Fluid Volume: Goal: Hemodynamic stability will improve Outcome: Progressing   Problem: Clinical Measurements: Goal: Diagnostic test results will improve Outcome: Progressing Goal: Signs and symptoms of infection will decrease Outcome: Progressing   Problem: Respiratory: Goal: Ability to maintain adequate ventilation will improve Outcome: Progressing   Problem: Education: Goal: Knowledge of risk factors and measures for prevention of condition will improve Outcome: Progressing   Problem: Coping: Goal: Psychosocial and spiritual needs will be supported Outcome: Progressing   Problem: Respiratory: Goal: Will maintain a patent airway Outcome: Progressing Goal: Complications related to the disease process, condition or treatment will be avoided or minimized Outcome: Progressing   Problem: Education: Goal: Ability to describe self-care measures that may prevent or decrease complications (Diabetes Survival Skills Education) will improve Outcome: Progressing Goal: Individualized Educational Video(s) Outcome: Progressing   Problem: Coping: Goal: Ability to adjust to condition or change in health will improve Outcome: Progressing   Problem: Fluid Volume: Goal: Ability to maintain a balanced intake and output will improve Outcome: Progressing   Problem: Health Behavior/Discharge Planning: Goal: Ability to identify and utilize available resources and services will improve Outcome: Progressing Goal: Ability to manage health-related needs will improve Outcome: Progressing   Problem: Metabolic: Goal: Ability to maintain appropriate glucose levels will improve Outcome: Progressing   Problem: Nutritional: Goal: Maintenance of adequate nutrition will improve Outcome: Progressing Goal: Progress toward achieving an optimal weight will improve Outcome: Progressing   Problem: Skin Integrity: Goal: Risk for impaired skin integrity will decrease Outcome: Progressing    Problem: Tissue Perfusion: Goal: Adequacy of tissue perfusion will improve Outcome: Progressing   Problem: Education: Goal: Knowledge of General Education information will improve Description: Including pain rating scale, medication(s)/side effects and non-pharmacologic comfort measures Outcome: Progressing   Problem: Health Behavior/Discharge Planning: Goal: Ability to manage health-related needs will improve Outcome: Progressing   Problem: Clinical Measurements: Goal: Ability to maintain clinical measurements within normal limits will improve Outcome: Progressing Goal: Will remain free from infection Outcome: Progressing Goal: Diagnostic test results will improve Outcome: Progressing Goal: Respiratory complications will improve Outcome: Progressing Goal: Cardiovascular complication will be avoided Outcome: Progressing   Problem: Activity: Goal: Risk for activity intolerance will decrease Outcome: Progressing   Problem: Nutrition: Goal: Adequate nutrition will be maintained Outcome: Progressing   Problem: Coping: Goal: Level of anxiety will decrease Outcome: Progressing   Problem: Elimination: Goal: Will not experience complications related to bowel motility Outcome: Progressing Goal: Will not experience complications related to urinary retention Outcome: Progressing   Problem: Pain Managment: Goal: General experience of comfort will improve and/or be controlled Outcome: Progressing   Problem: Safety: Goal: Ability to remain free from injury will improve Outcome: Progressing   Problem: Skin Integrity: Goal: Risk for impaired skin integrity will decrease Outcome: Progressing

## 2024-04-19 NOTE — Progress Notes (Signed)
 Patients wound dressings changed. Patient tolerated well.   Dedra Fantasia, BSN RN

## 2024-04-19 NOTE — Progress Notes (Signed)
 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 Critical care was consulted for persistent hypotension. Respiratory panel was negative. General surgery was consulted for sacral wound debridement with bedside excision on 03/05/2024 and 03/07/2024. He is now hemodynamically stable for discharge.    Assessment and Plan:   Severe sepsis sepsis - On presentation, lactic acidosis, leukocytosis, fever, secondary to infected chronic wound.  Blood cultures drawn, empiric IV fluid bolus, empiric antibiotics.  Sepsis now resolved.   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  General surgery now signed off.  Wound care following.  Completed antibiotics 5/1.  Infectious disease signed off.  Continue wound care management/regimen.  Offload pressure with every 2 hours turns, adequate nutrition.  Per wound pictures from yesterday 5/28, showing improvement with good/pink wound bed, no slough or purulence appreciated.  Don't think medihoney is needed.  Since he's showing improvement, will continue current regimen:   Cleanse with Vashe#[id number]. Apply Aquacel Ag+ (silver hydrofiber) Lawson # [id number] in the wound bed and fill the tunneling using a swab (change daily), cover with sacral foam dressing, change daily and PRN  soilage   Other pressure injuries -continue wound care, off loading, as ordered.  Going to be an ongoing struggle given pt's physical debilitation.  His heel just needs daily dressing changes if it opened up.  No need for Medihoney or more aggressive wound care at this time.   Acute on chronic pain - secondary to above -Currently on scheduled oxycontin  10mg  q12, with oxycodone  2.5mg  q4hr prn.  Pt sensitive to pain mediations, leading to increased delirium. Being very cautious about increasing dose.  Pain is tolerable and controlled at this time.     Paroxysmal atrial fibrillation - Continue amiodarone  and Eliquis .  Cardiology signed off.  Continues on telemetry due to daughter's preference.   HFrEF/cardiac amyloidosis/NICM - Continues on midodrine  for hypotension (holding parameters in place).  On tafamadis for amyloid.  GDMT not in place secondary to hypotension.  Cardiology signed off for now.  Given improvement in blood pressure, attempting to wean back midodrine  2.5mg  TID.  Will plan to wean further tomorrow is BP stable.  Monitor blood pressure closely.  Eventual goal to wean midodrine  and initiate GDMT slowly.    Insulin -dependent diabetes mellitus - Continues on glargine 7 units daily plus insulin  sliding scale.   Anemia of chronic disease - S/p 1 unit PRBC 4/30.  Hemoglobin stable, no active bleeding.   Dysphagia/severe protein calorie malnutrition - Nutrition/dietary following.  3 times daily protein calorie supplementation.  Vitamin supplementation.  Continues on pured diet.  Pt showing improvement in appetite.    Physical debilitation of muscle weakness - PT/OT on board.  Pt is motivated.  However, limited ability to sit upright in chair secondary to pain from sacral ulcer.  Continue to encourage participation.    Goals of care - Patient  is medically appropriate for discharge at this time.  Working closely with TOC family on disposition planning.  Reports that family/daughter  wants to be absolutely sure wound is healing prior to transition out of the hospital.  Unclear at this point the specific goal acceptable for the daughter to transition out of the hospital.  Pt himself has a desire to ultimately go home.  However given his physical ability, STR or fulltime care would likely be required.   Subjective: Patient resting comfortably this morning.  Was able to work with physical therapy yesterday.  Patient himself is very motivated to work with therapy.  Does admit that he has increased pain when attempting to sit at the bedside chair.  Continue to encourage patient.  He appears motivated to eventually be discharged home.  Currently denies any fevers, chills, chest pain, nausea, vomiting, abdominal pain.  Pain is mostly in his back.  Physical Exam: Vitals:   04/18/24 1614 04/18/24 1946 04/19/24 0545 04/19/24 0741  BP: 129/74 (!) 107/56 135/72 (!) 143/74  Pulse: 74 70 82 75  Resp: 17 16 17 18   Temp: 98 F (36.7 C) 98.8 F (37.1 C) (!) 97.5 F (36.4 C) 98.1 F (36.7 C)  TempSrc: Oral Oral  Oral  SpO2: 100% 100% 95% 100%  Weight:      Height:        GENERAL:  Alert, pleasant, no acute distress, frail HEENT:  EOMI CARDIOVASCULAR:  RRR, no murmurs appreciated RESPIRATORY:  Clear to auscultation, no wheezing, rales, or rhonchi GASTROINTESTINAL:  Soft, nontender, nondistended EXTREMITIES:  No LE edema bilaterally NEURO:  No new focal deficits appreciated SKIN: Bandage over sacrum, heel boots in place PSYCH:  Appropriate mood and affect    Data Reviewed:  No new imaging to review  Previous records (including but not limited to H&P, progress notes, nursing notes, TOC management) were reviewed in assessment of this patient.  Labs: CBC: Recent Labs  Lab 04/14/24 0737 04/17/24 0612 04/18/24 0728 04/19/24 0852  WBC 6.0 6.2 5.8 6.1  HGB 9.9* 10.0* 10.1* 10.2*  HCT 30.6* 31.1* 31.1* 32.0*  MCV 87.2 88.1 86.6 87.7  PLT 287 305 302 310   Basic Metabolic  Panel: Recent Labs  Lab 04/14/24 0737 04/17/24 0612 04/18/24 0728 04/19/24 0852  NA 139 141 139 139  K 3.7 3.9 4.1 4.0  CL 105 107 105 108  CO2 26 26 26 24   GLUCOSE 96 146* 140* 117*  BUN 27* 30* 31* 30*  CREATININE 0.86 0.78 0.80 0.79  CALCIUM  8.0* 8.2* 8.4* 8.4*  MG  --   --  1.8 1.9  PHOS  --   --   --  3.9   Liver Function Tests: No results for input(s): "AST", "ALT", "ALKPHOS", "BILITOT", "PROT", "ALBUMIN " in the last 168 hours. CBG: Recent Labs  Lab 04/18/24 0802 04/18/24 1223 04/18/24 1611 04/18/24 2059 04/19/24 0742  GLUCAP 133* 135* 154* 93 121*    Scheduled Meds:  acetaminophen   500 mg Oral TID   amiodarone   200 mg Oral Daily   apixaban   5 mg Oral BID   atorvastatin   40 mg Oral Daily   Chlorhexidine  Gluconate Cloth  6 each Topical Daily   collagenase    Topical Daily   diclofenac  Sodium  4 g Topical QID   feeding supplement  237 mL Oral TID BM   ferrous sulfate   325 mg Oral QHS   furosemide   20 mg Oral Daily   guaiFENesin   600 mg Oral BID  insulin  aspart  0-15 Units Subcutaneous TID WC   insulin  aspart  0-5 Units Subcutaneous QHS   insulin  aspart  4 Units Subcutaneous TID WC   insulin  glargine-yfgn  7 Units Subcutaneous Daily   levothyroxine   50 mcg Oral Q0600   liver oil-zinc  oxide   Topical BID   midodrine   2.5 mg Oral TID WC   multivitamin with minerals  1 tablet Oral Daily   nutrition supplement (JUVEN)  1 packet Oral BID BM   mouth rinse  15 mL Mouth Rinse 4 times per day   oxyCODONE   10 mg Oral Q12H   phosphorus  500 mg Oral TID   polyethylene glycol  17 g Oral BID   senna-docusate  1 tablet Oral QHS   sodium chloride  flush  3 mL Intravenous Q12H   Tafamidis   1 capsule Oral Daily   Continuous Infusions: PRN Meds:.acetaminophen  **OR** acetaminophen , food thickener, lip balm, ondansetron  **OR** ondansetron  (ZOFRAN ) IV, mouth rinse, oxyCODONE , polyethylene glycol, polyvinyl alcohol , sodium chloride  flush  Family Communication: None at  bedside  Disposition: Status is: Inpatient Remains inpatient appropriate because: Position     Time spent: 36 minutes  Length of stay: 46 days  Author: Jodeane Mulligan, DO 04/19/2024 10:38 AM  For on call review [email or web address].

## 2024-04-20 DIAGNOSIS — L98429 Non-pressure chronic ulcer of back with unspecified severity: Secondary | ICD-10-CM | POA: Diagnosis not present

## 2024-04-20 DIAGNOSIS — E119 Type 2 diabetes mellitus without complications: Secondary | ICD-10-CM | POA: Diagnosis not present

## 2024-04-20 DIAGNOSIS — E43 Unspecified severe protein-calorie malnutrition: Secondary | ICD-10-CM | POA: Diagnosis not present

## 2024-04-20 DIAGNOSIS — A419 Sepsis, unspecified organism: Secondary | ICD-10-CM | POA: Diagnosis not present

## 2024-04-20 DIAGNOSIS — I959 Hypotension, unspecified: Secondary | ICD-10-CM

## 2024-04-20 LAB — GLUCOSE, CAPILLARY
Glucose-Capillary: 163 mg/dL — ABNORMAL HIGH (ref 70–99)
Glucose-Capillary: 124 mg/dL — ABNORMAL HIGH (ref 70–99)
Glucose-Capillary: 128 mg/dL — ABNORMAL HIGH (ref 70–99)
Glucose-Capillary: 82 mg/dL (ref 70–99)

## 2024-04-20 MED ORDER — OXYCODONE HCL ER 10 MG PO T12A
10.0000 mg | EXTENDED_RELEASE_TABLET | Freq: Once | ORAL | Status: AC
  Administered 2024-04-20: 10 mg via ORAL
  Filled 2024-04-20: qty 1

## 2024-04-20 MED ORDER — OXYCODONE HCL ER 10 MG PO T12A
10.0000 mg | EXTENDED_RELEASE_TABLET | Freq: Every day | ORAL | Status: DC
  Administered 2024-04-21 – 2024-05-05 (×14): 10 mg via ORAL
  Filled 2024-04-20 (×15): qty 1

## 2024-04-20 NOTE — Progress Notes (Signed)
 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 Critical care was consulted for persistent hypotension. Respiratory panel was negative. General surgery was consulted for sacral wound debridement with bedside excision on 03/05/2024 and 03/07/2024. He is now hemodynamically stable for discharge.    Assessment and Plan:   Severe sepsis sepsis - On presentation, lactic acidosis, leukocytosis, fever, secondary to infected chronic wound.  Blood cultures drawn, empiric IV fluid bolus, empiric antibiotics.  Sepsis now resolved.   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  General surgery now signed off.  Wound care following.  Completed antibiotics 5/1.  Infectious disease signed off.  Continue wound care management/regimen.  Offload pressure with every 2 hours turns, adequate nutrition.  Personally evaluated this morning, continues showing improvement with good/pink wound bed, no slough or purulence appreciated.  No tunneling appreciated.  Diameter appears improved from presentation.  Don't think medihoney is warranted at this time.  Since he's showing improvement, will continue current regimen:   Cleanse with Vashe#[id number]. Apply Aquacel Ag+ (silver hydrofiber) Lawson # [id number] in the wound bed and fill the  tunneling using a swab (change daily), cover with sacral foam dressing, change daily and PRN soilage   Other pressure injuries -continue wound care, off loading, as ordered.  Going to be an ongoing struggle given pt's physical debilitation.  His heel just needs daily dressing changes if it opened up.  No need for Medihoney or more aggressive wound care at this time.   Acute on chronic pain - secondary to above -Currently on scheduled oxycontin  10mg  q12, with oxycodone  2.5mg  q4hr prn.  Pt sensitive to pain mediations, leading to increased delirium. Being very cautious about increasing dose.  Pain is tolerable and controlled at this time.  Talking with daughter at bedside, will attempt to wean OxyContin .  Given she does not come in smaller doses and cannot split it, will attempt discontinuing a.m. dose, continue OxyContin  10 mg at bedtime.  Monitor pain response closely.   Paroxysmal atrial fibrillation - Continue amiodarone  and Eliquis .  Cardiology signed off.  Continues on telemetry due to daughter's preference.   HFrEF/cardiac amyloidosis/NICM - Continues on midodrine  for hypotension (holding parameters in place).  On tafamadis for amyloid.  GDMT not in place secondary to hypotension.  Cardiology signed off for now.  Given improvement in blood pressure, attempting to wean back midodrine .  Will attempt to discontinue midodrine  today.  Monitor blood pressure closely.  Eventual goal to wean midodrine  and initiate GDMT slowly.    Insulin -dependent diabetes mellitus - Continues on glargine 7 units daily plus insulin  sliding scale.   Anemia of chronic disease - S/p 1 unit PRBC 4/30.  Hemoglobin stable, no active bleeding.   Dysphagia/severe protein calorie malnutrition - Nutrition/dietary following.  3 times daily protein calorie supplementation.  Vitamin supplementation.  Continues on pured diet.  Pt showing improvement in appetite.  Ideally would  benefit from advancement of diet if possible per  SLP.   Physical debilitation of muscle weakness - PT/OT on board.  Pt is motivated.  However, limited ability to sit upright in chair secondary to pain from sacral ulcer.  Continue to encourage participation.  Pain appears to be limiting factor as well as overall muscle strength and weakness.   Goals of care - Patient is medically appropriate for discharge at this time.  Working closely with TOC family on disposition planning.  Patient's daughter wants to be absolutely sure wound is healing prior to transition out of the hospital.  However given his physical ability, STR or fulltime care would likely be required.  Discussion today 5/31 about goals and disposition.  Ideally her goal is to have his sacral wound nearly completely healed prior to discharge being a stage I or small stage II.  Given his rehospitalization secondary to sepsis from infected sacral ulcer, she is hesitant to transition to STR where care may be limited and recurrence of infection becomes more likely.  This is understandable and agreeable from my standpoint.  As his ulcer improves his pain will improve, as pain improves his PT will improve.  There is frank understanding that patient will likely not rebound to his previous independence however would like to optimize his health is much as possible prior to transition to PT rehab.  Subjective: Patient himself doing well this morning, pain is better controlled around his wound.  Denies any fever, chills, shortness of breath, chest pain, nausea, vomiting, abdominal pain.  Intermittent coughing with eating at night but otherwise no change this morning.  Physical Exam:  Vitals:   04/19/24 1214 04/20/24 0023 04/20/24 0421 04/20/24 0741  BP: (!) 173/72 126/80 119/75 119/82  Pulse: 63 84 86 82  Resp: 19 18 18 19   Temp: 98.4 F (36.9 C) (!) 97.5 F (36.4 C) (!) 97.5 F (36.4 C) (!) 97.5 F (36.4 C)  TempSrc: Oral Oral Oral Oral  SpO2: 100% 97% 99% 100%  Weight:      Height:         GENERAL:  Alert, pleasant, no acute distress, frail HEENT:  EOMI CARDIOVASCULAR:  RRR, no murmurs appreciated RESPIRATORY:  Clear to auscultation, no wheezing, rales, or rhonchi GASTROINTESTINAL:  Soft, nontender, nondistended EXTREMITIES:  No LE edema bilaterally NEURO:  No new focal deficits appreciated SKIN: Approximately 10 x 7 cm sacral ulcer, clean, well perfused wound bed, no slough or purulence, tender, no surrounding erythema or edema PSYCH:  Appropriate mood and affect    Data Reviewed:  No new imaging to review  Previous records (including but not limited to H&P, progress notes, nursing notes, TOC management) were reviewed in assessment of this patient.  Labs: CBC: Recent Labs  Lab 04/14/24 0737 04/17/24 0612 04/18/24 0728 04/19/24 0852  WBC 6.0 6.2 5.8 6.1  HGB 9.9* 10.0* 10.1* 10.2*  HCT 30.6* 31.1* 31.1* 32.0*  MCV 87.2 88.1 86.6 87.7  PLT 287 305 302 310   Basic Metabolic Panel: Recent Labs  Lab 04/14/24 0737 04/17/24 0612 04/18/24 0728 04/19/24 0852  NA 139 141 139 139  K 3.7 3.9 4.1 4.0  CL 105 107 105 108  CO2 26 26 26 24   GLUCOSE 96 146* 140* 117*  BUN 27* 30* 31* 30*  CREATININE 0.86 0.78 0.80 0.79  CALCIUM  8.0* 8.2* 8.4* 8.4*  MG  --   --  1.8 1.9  PHOS  --   --   --  3.9  Liver Function Tests: No results for input(s): "AST", "ALT", "ALKPHOS", "BILITOT", "PROT", "ALBUMIN " in the last 168 hours. CBG: Recent Labs  Lab 04/19/24 0742 04/19/24 1210 04/19/24 1847 04/19/24 2143 04/20/24 0742  GLUCAP 121* 226* 130* 164* 163*    Scheduled Meds:  acetaminophen   500 mg Oral TID   amiodarone   200 mg Oral Daily   apixaban   5 mg Oral BID   atorvastatin   40 mg Oral Daily   Chlorhexidine  Gluconate Cloth  6 each Topical Daily   collagenase    Topical Daily   diclofenac  Sodium  4 g Topical QID   feeding supplement  237 mL Oral TID BM   ferrous sulfate   325 mg Oral QHS   furosemide   20 mg Oral Daily   guaiFENesin   600 mg Oral BID    insulin  aspart  0-15 Units Subcutaneous TID WC   insulin  aspart  0-5 Units Subcutaneous QHS   insulin  aspart  4 Units Subcutaneous TID WC   insulin  glargine-yfgn  7 Units Subcutaneous Daily   levothyroxine   50 mcg Oral Q0600   liver oil-zinc  oxide   Topical BID   midodrine   2.5 mg Oral TID WC   multivitamin with minerals  1 tablet Oral Daily   nutrition supplement (JUVEN)  1 packet Oral BID BM   mouth rinse  15 mL Mouth Rinse 4 times per day   oxyCODONE   10 mg Oral Q12H   phosphorus  500 mg Oral TID   polyethylene glycol  17 g Oral BID   senna-docusate  1 tablet Oral QHS   sodium chloride  flush  3 mL Intravenous Q12H   Tafamidis   1 capsule Oral Daily   Continuous Infusions: PRN Meds:.acetaminophen  **OR** acetaminophen , food thickener, lip balm, ondansetron  **OR** ondansetron  (ZOFRAN ) IV, mouth rinse, oxyCODONE , polyethylene glycol, polyvinyl alcohol , sodium chloride  flush  Family Communication: Daughter at bedside  Disposition: Status is: Inpatient Remains inpatient appropriate because: Sacral wound, disposition     Time spent: 51 minutes  Length of inpatient stay: 47 days  Author: Jodeane Mulligan, DO 04/20/2024 12:53 PM  For on call review [email or web address].

## 2024-04-20 NOTE — Plan of Care (Signed)
  Problem: Fluid Volume: Goal: Hemodynamic stability will improve Outcome: Progressing   Problem: Clinical Measurements: Goal: Diagnostic test results will improve Outcome: Progressing Goal: Signs and symptoms of infection will decrease Outcome: Progressing   Problem: Respiratory: Goal: Ability to maintain adequate ventilation will improve Outcome: Progressing   Problem: Education: Goal: Knowledge of risk factors and measures for prevention of condition will improve Outcome: Progressing   Problem: Coping: Goal: Psychosocial and spiritual needs will be supported Outcome: Progressing   Problem: Respiratory: Goal: Will maintain a patent airway Outcome: Progressing Goal: Complications related to the disease process, condition or treatment will be avoided or minimized Outcome: Progressing   Problem: Education: Goal: Ability to describe self-care measures that may prevent or decrease complications (Diabetes Survival Skills Education) will improve Outcome: Progressing Goal: Individualized Educational Video(s) Outcome: Progressing   Problem: Coping: Goal: Ability to adjust to condition or change in health will improve Outcome: Progressing   Problem: Fluid Volume: Goal: Ability to maintain a balanced intake and output will improve Outcome: Progressing   Problem: Health Behavior/Discharge Planning: Goal: Ability to identify and utilize available resources and services will improve Outcome: Progressing Goal: Ability to manage health-related needs will improve Outcome: Progressing   Problem: Metabolic: Goal: Ability to maintain appropriate glucose levels will improve Outcome: Progressing   Problem: Nutritional: Goal: Maintenance of adequate nutrition will improve Outcome: Progressing Goal: Progress toward achieving an optimal weight will improve Outcome: Progressing   Problem: Skin Integrity: Goal: Risk for impaired skin integrity will decrease Outcome: Progressing    Problem: Tissue Perfusion: Goal: Adequacy of tissue perfusion will improve Outcome: Progressing   Problem: Education: Goal: Knowledge of General Education information will improve Description: Including pain rating scale, medication(s)/side effects and non-pharmacologic comfort measures Outcome: Progressing   Problem: Health Behavior/Discharge Planning: Goal: Ability to manage health-related needs will improve Outcome: Progressing   Problem: Clinical Measurements: Goal: Ability to maintain clinical measurements within normal limits will improve Outcome: Progressing Goal: Will remain free from infection Outcome: Progressing Goal: Diagnostic test results will improve Outcome: Progressing Goal: Respiratory complications will improve Outcome: Progressing Goal: Cardiovascular complication will be avoided Outcome: Progressing   Problem: Activity: Goal: Risk for activity intolerance will decrease Outcome: Progressing   Problem: Nutrition: Goal: Adequate nutrition will be maintained Outcome: Progressing   Problem: Coping: Goal: Level of anxiety will decrease Outcome: Progressing   Problem: Elimination: Goal: Will not experience complications related to bowel motility Outcome: Progressing Goal: Will not experience complications related to urinary retention Outcome: Progressing   Problem: Pain Managment: Goal: General experience of comfort will improve and/or be controlled Outcome: Progressing   Problem: Safety: Goal: Ability to remain free from injury will improve Outcome: Progressing   Problem: Skin Integrity: Goal: Risk for impaired skin integrity will decrease Outcome: Progressing

## 2024-04-20 NOTE — Progress Notes (Signed)
 [facility] night cross cover note:   I was notified by the patient's RN that this patient has an existing order for scheduled long-acting OxyContin  10, but that the first dose of this order is not set to occur until tomorrow (6/1), and that the patient/his family are inquiring if the patient can have a dose of the OxyContin  this evening.  I subsequently ordered OxyContin  10 mg p.o. x 1 dose now.     Camelia Cavalier, DO Hospitalist

## 2024-04-21 DIAGNOSIS — E43 Unspecified severe protein-calorie malnutrition: Secondary | ICD-10-CM | POA: Diagnosis not present

## 2024-04-21 DIAGNOSIS — Z8679 Personal history of other diseases of the circulatory system: Secondary | ICD-10-CM | POA: Diagnosis not present

## 2024-04-21 DIAGNOSIS — L98429 Non-pressure chronic ulcer of back with unspecified severity: Secondary | ICD-10-CM | POA: Diagnosis not present

## 2024-04-21 LAB — GLUCOSE, CAPILLARY
Glucose-Capillary: 92 mg/dL (ref 70–99)
Glucose-Capillary: 174 mg/dL — ABNORMAL HIGH (ref 70–99)
Glucose-Capillary: 132 mg/dL — ABNORMAL HIGH (ref 70–99)
Glucose-Capillary: 153 mg/dL — ABNORMAL HIGH (ref 70–99)

## 2024-04-21 NOTE — Plan of Care (Signed)
  Problem: Fluid Volume: Goal: Hemodynamic stability will improve Outcome: Progressing   Problem: Clinical Measurements: Goal: Diagnostic test results will improve Outcome: Progressing Goal: Signs and symptoms of infection will decrease Outcome: Progressing   Problem: Respiratory: Goal: Ability to maintain adequate ventilation will improve Outcome: Progressing   Problem: Education: Goal: Knowledge of risk factors and measures for prevention of condition will improve Outcome: Progressing   Problem: Coping: Goal: Psychosocial and spiritual needs will be supported Outcome: Progressing   Problem: Respiratory: Goal: Will maintain a patent airway Outcome: Progressing Goal: Complications related to the disease process, condition or treatment will be avoided or minimized Outcome: Progressing   Problem: Education: Goal: Ability to describe self-care measures that may prevent or decrease complications (Diabetes Survival Skills Education) will improve Outcome: Progressing Goal: Individualized Educational Video(s) Outcome: Progressing   Problem: Coping: Goal: Ability to adjust to condition or change in health will improve Outcome: Progressing   Problem: Fluid Volume: Goal: Ability to maintain a balanced intake and output will improve Outcome: Progressing   Problem: Health Behavior/Discharge Planning: Goal: Ability to identify and utilize available resources and services will improve Outcome: Progressing Goal: Ability to manage health-related needs will improve Outcome: Progressing   Problem: Metabolic: Goal: Ability to maintain appropriate glucose levels will improve Outcome: Progressing   Problem: Nutritional: Goal: Maintenance of adequate nutrition will improve Outcome: Progressing Goal: Progress toward achieving an optimal weight will improve Outcome: Progressing   Problem: Skin Integrity: Goal: Risk for impaired skin integrity will decrease Outcome: Progressing    Problem: Tissue Perfusion: Goal: Adequacy of tissue perfusion will improve Outcome: Progressing   Problem: Education: Goal: Knowledge of General Education information will improve Description: Including pain rating scale, medication(s)/side effects and non-pharmacologic comfort measures Outcome: Progressing   Problem: Health Behavior/Discharge Planning: Goal: Ability to manage health-related needs will improve Outcome: Progressing   Problem: Clinical Measurements: Goal: Ability to maintain clinical measurements within normal limits will improve Outcome: Progressing Goal: Will remain free from infection Outcome: Progressing Goal: Diagnostic test results will improve Outcome: Progressing Goal: Respiratory complications will improve Outcome: Progressing Goal: Cardiovascular complication will be avoided Outcome: Progressing   Problem: Activity: Goal: Risk for activity intolerance will decrease Outcome: Progressing   Problem: Nutrition: Goal: Adequate nutrition will be maintained Outcome: Progressing   Problem: Coping: Goal: Level of anxiety will decrease Outcome: Progressing   Problem: Elimination: Goal: Will not experience complications related to bowel motility Outcome: Progressing Goal: Will not experience complications related to urinary retention Outcome: Progressing   Problem: Pain Managment: Goal: General experience of comfort will improve and/or be controlled Outcome: Progressing   Problem: Safety: Goal: Ability to remain free from injury will improve Outcome: Progressing   Problem: Skin Integrity: Goal: Risk for impaired skin integrity will decrease Outcome: Progressing

## 2024-04-21 NOTE — Plan of Care (Signed)
   Problem: Fluid Volume: Goal: Hemodynamic stability will improve Outcome: Progressing

## 2024-04-21 NOTE — Progress Notes (Signed)
 Progress Note   Patient: Earl [name] ZOX:[id number] DOB: Dec 14, 1940 DOA: 03/04/2024  DOS: the patient was seen and examined on 04/21/2024   Brief hospital course:  83 yrs old male with past medical history of paroxysmal atrial fibrillation on Eliquis , insulin -dependent diabetes mellitus type 2, essential hypertension, CVA with right residual weakness, MGUS, BPH, HFr EF of 40%, He was discharged, recently from [facility] after admitted for acute metabolic encephalopathy, rhabdomyolysis and orthostatic, chronic pancytopenia presents to the ED from skilled nursing facility for shortness of breath, sacral pain, In the ED, he was found to be in RVR, tachypneic with saturations greater than 90%. Critical care was consulted for persistent hypotension. Respiratory panel was negative. General surgery was consulted for sacral wound debridement with bedside excision on 03/05/2024 and 03/07/2024. He is now hemodynamically stable for discharge.    Assessment and Plan:   Severe sepsis sepsis - On presentation, lactic acidosis, leukocytosis, fever, secondary to infected chronic wound.  Blood cultures drawn, empiric IV fluid bolus, empiric antibiotics.  Sepsis now resolved.   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  Completed antibiotics 5/1.  General surgery now signed off.  Wound care following.  Infectious disease signed off.  Continue wound care management/regimen.  Offload pressure with every 2 hours turns, adequate nutrition.  Continues showing improvement.  Will continue current regimen:   Cleanse with Vashe#[id number]. Apply Aquacel Ag+ (silver hydrofiber) Lawson # [id number] in the wound bed and fill the tunneling using a swab (change daily), cover with sacral foam dressing, change daily and PRN soilage   Other pressure injuries -continue wound care, off loading, as ordered.  Going to be an ongoing struggle given pt's physical debilitation.     Acute  on chronic pain - secondary to above -Changed to scheduled oxycontin  10mg  at bedtime, with oxycodone  2.5mg  q4hr prn.  Will monitor response this morning and through tomorrow.  Will modify regimen if pain inadequately controlled by adding a scheduled daily oxycodone  5mg  or 2.5mg .  Pt doing well with weaning so far.  Eventual goal to wean off opioids entirely by discharge.   Paroxysmal atrial fibrillation - Continue amiodarone  and Eliquis .  Cardiology signed off.  Continues on telemetry due to daughter's preference.   HFrEF/cardiac amyloidosis/NICM - On tafamadis for amyloid.  GDMT not in place secondary to hypotension.  Cardiology signed off for now.  Appears to be tolerating discontinuation of midodrine .  Will give another day to monitor BP.  If possible, will attempt to initiate GDMT slowly.     Insulin -dependent diabetes mellitus - Continues on glargine 7 units daily plus insulin  sliding scale.   Anemia of chronic disease - S/p 1 unit PRBC 4/30.  Hemoglobin stable, no active bleeding.   Dysphagia/severe protein calorie malnutrition - Nutrition/dietary following.  3 times daily protein calorie supplementation.  Vitamin supplementation.  Continues on pured diet.  Pt showing improvement in appetite.  Ideally would benefit from advancement of diet if possible per SLP.   Physical debilitation of muscle weakness - PT/OT on board.  Pt is motivated.  However, limited ability to sit upright in chair secondary to pain from sacral ulcer.  Continue to encourage participation.  Pain appears to be limiting factor as well as overall muscle strength and weakness.   Goals of care - Patient is medically appropriate for discharge at this time.  Working closely with TOC family on disposition planning.  Patient's daughter wants to be absolutely sure wound is healing prior to  transition out of the hospital.  However given his physical ability, STR or fulltime care would likely be required.   Discussion today  5/31 about goals and disposition.  Ideally her goal is to have his sacral wound nearly completely healed prior to discharge being a stage I or small stage II.  Given his rehospitalization secondary to sepsis from infected sacral ulcer, she is hesitant to transition to STR where care may be limited and recurrence of infection becomes more likely.  This is understandable and agreeable from my standpoint.  As his ulcer improves his pain will improve, as pain improves his PT will improve.  There is frank understanding that patient will likely not rebound to his previous independence however would like to optimize his health is much as possible prior to transition to PT rehab.   Subjective: Patient doing well this morning, laying in bed, eating breakfast.  Denies any fever, chills, chest pain, nausea, vomiting, abdominal pain.  Stable to converse with daughter at bedside about his opioid strategy going forward.  Attempting to wean OxyContin  from every 12 to nightly and monitor his response closely.  Will rely on her diligence and observations to guide his ongoing pain regiment with the ultimate goal to wean off entirely prior to discharge.  Physical Exam:  Vitals:   04/20/24 2319 04/21/24 0526 04/21/24 0751 04/21/24 1229  BP: 110/66 135/88 117/74 125/80  Pulse: 84 84 81 84  Resp: 18 18 18 19   Temp: 97.8 F (36.6 C) 98 F (36.7 C) (!) 97.4 F (36.3 C) 97.6 F (36.4 C)  TempSrc: Oral Oral Oral Oral  SpO2: 100% 100% 100% 100%  Weight:  78.5 kg    Height:        GENERAL:  Alert, pleasant, no acute distress, frail HEENT:  EOMI CARDIOVASCULAR:  RRR, no murmurs appreciated RESPIRATORY:  Clear to auscultation, no wheezing, rales, or rhonchi GASTROINTESTINAL:  Soft, nontender, nondistended EXTREMITIES:  No LE edema bilaterally NEURO:  No new focal deficits appreciated SKIN: Fresh bandage over sacrum  PSYCH:  Appropriate mood and affect    Data Reviewed:  No new imaging to review  Previous  records (including but not limited to H&P, progress notes, nursing notes, TOC management) were reviewed in assessment of this patient.  Labs: CBC: Recent Labs  Lab 04/17/24 0612 04/18/24 0728 04/19/24 0852  WBC 6.2 5.8 6.1  HGB 10.0* 10.1* 10.2*  HCT 31.1* 31.1* 32.0*  MCV 88.1 86.6 87.7  PLT 305 302 310   Basic Metabolic Panel: Recent Labs  Lab 04/17/24 0612 04/18/24 0728 04/19/24 0852  NA 141 139 139  K 3.9 4.1 4.0  CL 107 105 108  CO2 26 26 24   GLUCOSE 146* 140* 117*  BUN 30* 31* 30*  CREATININE 0.78 0.80 0.79  CALCIUM  8.2* 8.4* 8.4*  MG  --  1.8 1.9  PHOS  --   --  3.9   Liver Function Tests: No results for input(s): "AST", "ALT", "ALKPHOS", "BILITOT", "PROT", "ALBUMIN " in the last 168 hours. CBG: Recent Labs  Lab 04/20/24 1341 04/20/24 1614 04/20/24 2031 04/21/24 0850 04/21/24 1235  GLUCAP 124* 128* 82 92 174*    Scheduled Meds:  acetaminophen   500 mg Oral TID   amiodarone   200 mg Oral Daily   apixaban   5 mg Oral BID   atorvastatin   40 mg Oral Daily   Chlorhexidine  Gluconate Cloth  6 each Topical Daily   collagenase    Topical Daily   diclofenac  Sodium  4 g  Topical QID   feeding supplement  237 mL Oral TID BM   ferrous sulfate   325 mg Oral QHS   furosemide   20 mg Oral Daily   guaiFENesin   600 mg Oral BID   insulin  aspart  0-15 Units Subcutaneous TID WC   insulin  aspart  0-5 Units Subcutaneous QHS   insulin  aspart  4 Units Subcutaneous TID WC   insulin  glargine-yfgn  7 Units Subcutaneous Daily   levothyroxine   50 mcg Oral Q0600   liver oil-zinc  oxide   Topical BID   multivitamin with minerals  1 tablet Oral Daily   nutrition supplement (JUVEN)  1 packet Oral BID BM   mouth rinse  15 mL Mouth Rinse 4 times per day   oxyCODONE   10 mg Oral QHS   phosphorus  500 mg Oral TID   polyethylene glycol  17 g Oral BID   senna-docusate  1 tablet Oral QHS   sodium chloride  flush  3 mL Intravenous Q12H   Tafamidis   1 capsule Oral Daily   Continuous  Infusions: PRN Meds:.acetaminophen  **OR** acetaminophen , food thickener, lip balm, ondansetron  **OR** ondansetron  (ZOFRAN ) IV, mouth rinse, oxyCODONE , polyethylene glycol, polyvinyl alcohol , sodium chloride  flush  Family Communication: Daughter at bedside  Disposition: Status is: Inpatient Remains inpatient appropriate because: Disposition, sacral ulcer     Time spent: 39 minutes  Length of inpatient stay: 48 days  Author: Jodeane Mulligan, DO 04/21/2024 1:39 PM  For on call review [email or web address].

## 2024-04-22 ENCOUNTER — Other Ambulatory Visit: Payer: Self-pay

## 2024-04-22 DIAGNOSIS — E119 Type 2 diabetes mellitus without complications: Secondary | ICD-10-CM | POA: Diagnosis not present

## 2024-04-22 DIAGNOSIS — Z794 Long term (current) use of insulin: Secondary | ICD-10-CM | POA: Diagnosis not present

## 2024-04-22 DIAGNOSIS — L98429 Non-pressure chronic ulcer of back with unspecified severity: Secondary | ICD-10-CM | POA: Diagnosis not present

## 2024-04-22 DIAGNOSIS — E43 Unspecified severe protein-calorie malnutrition: Secondary | ICD-10-CM | POA: Diagnosis not present

## 2024-04-22 LAB — GLUCOSE, CAPILLARY
Glucose-Capillary: 157 mg/dL — ABNORMAL HIGH (ref 70–99)
Glucose-Capillary: 171 mg/dL — ABNORMAL HIGH (ref 70–99)
Glucose-Capillary: 111 mg/dL — ABNORMAL HIGH (ref 70–99)
Glucose-Capillary: 77 mg/dL (ref 70–99)

## 2024-04-22 NOTE — Progress Notes (Signed)
 Specialty Pharmacy Refill Coordination Note  Earl [name] is a 83 y.o. male contacted today regarding refills of specialty medication(s) Tafamidis  (Vyndamax )   Patient requested (Proxy-Rptd) Delivery   Delivery date: (Proxy-Rptd) 04/24/24   Verified address: (Proxy-Rptd) [address] [name], [place]  [id number]   Medication will be filled on 04/23/24.

## 2024-04-22 NOTE — Progress Notes (Signed)
 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 Critical care was consulted for persistent hypotension. Respiratory panel was negative. General surgery was consulted for sacral wound debridement with bedside excision on 03/05/2024 and 03/07/2024. He is now hemodynamically stable for discharge.    Assessment and Plan:   Severe sepsis sepsis - On presentation, lactic acidosis, leukocytosis, fever, secondary to infected chronic wound.  Blood cultures drawn, empiric IV fluid bolus, empiric antibiotics.  Sepsis now resolved.   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  Completed antibiotics 5/1.  General surgery now signed off.  Wound care following.  Infectious disease signed off.  Continue wound care management/regimen.  Offload pressure with every 2 hours turns, adequate nutrition.  Continues showing improvement.  Will continue current regimen:   Cleanse with Vashe#[id number]. Apply Aquacel Ag+ (silver hydrofiber) Lawson # [id number] in the wound bed and fill the tunneling using a swab (change daily), cover with sacral foam dressing, change daily and PRN soilage   Other pressure injuries -continue wound care, off loading, as ordered.  Going to be an ongoing struggle given pt's physical debilitation.     Acute  on chronic pain - secondary to above -Changed to scheduled oxycontin  10mg  at bedtime, with oxycodone  2.5mg  q4hr prn.  Appears to be tolerating well.  Will modify regimen if pain inadequately controlled by adding a scheduled daily oxycodone  5mg  or 2.5mg .  Pt doing well with weaning so far.  Eventual goal to wean off opioids entirely by discharge.   Paroxysmal atrial fibrillation - Continue amiodarone  and Eliquis .  Cardiology signed off.  Continues on telemetry due to daughter's preference.   HFrEF/cardiac amyloidosis/NICM - On tafamadis for amyloid.  GDMT not in place secondary to hypotension.  Cardiology signed off for now.  Appears to be tolerating discontinuation of midodrine .  Will give another day to monitor BP.  If possible, will attempt to initiate GDMT slowly.     Insulin -dependent diabetes mellitus - Continues on glargine 7 units daily plus insulin  sliding scale.   Anemia of chronic disease - S/p 1 unit PRBC 4/30.  Hemoglobin stable, no active bleeding.   Dysphagia/severe protein calorie malnutrition - Nutrition/dietary following.  3 times daily protein calorie supplementation.  Vitamin supplementation.  Continues on pured diet.  Pt showing improvement in appetite.  Ideally would benefit from advancement of diet if possible per SLP.   Physical debilitation of muscle weakness - PT/OT on board.  Pt is motivated.  However, limited ability to sit upright in chair secondary to pain from sacral ulcer.  Continue to encourage participation.  Pain appears to be limiting factor as well as overall muscle strength and weakness.   Goals of care - Patient is medically appropriate for discharge at this time.  Working closely with TOC family on disposition planning.  Patient's daughter wants to be absolutely sure wound is healing prior to transition out of  the hospital.  However given his physical ability, STR or fulltime care would likely be required.   Discussion today 5/31 about goals and  disposition.  Ideally her goal is to have his sacral wound nearly completely healed prior to discharge being a stage I or small stage II.  Given his rehospitalization secondary to sepsis from infected sacral ulcer, she is hesitant to transition to STR where care may be limited and recurrence of infection becomes more likely.  This is understandable and agreeable from my standpoint.  As his ulcer improves his pain will improve, as pain improves his PT will improve.  There is frank understanding that patient will likely not rebound to his previous independence however would like to optimize his health is much as possible prior to transition to PT rehab.   Subjective: Patient doing well this morning, laying in bed, eating breakfast. Denies any fever, chills, chest pain, nausea, vomiting, abdominal pain.   Physical Exam:  Vitals:   04/21/24 2334 04/22/24 0500 04/22/24 0817 04/22/24 1234  BP: 118/68  123/74 117/73  Pulse: 81  80 62  Resp: 18  18   Temp: 98 F (36.7 C)  (!) 97.4 F (36.3 C) 97.6 F (36.4 C)  TempSrc: Oral  Oral Oral  SpO2: 100%  99% 97%  Weight:  74.8 kg    Height:        GENERAL:  Alert, pleasant, no acute distress, frail HEENT:  EOMI CARDIOVASCULAR:  RRR, no murmurs appreciated RESPIRATORY:  Clear to auscultation, no wheezing, rales, or rhonchi GASTROINTESTINAL:  Soft, nontender, nondistended EXTREMITIES:  No LE edema bilaterally NEURO:  No new focal deficits appreciated SKIN: Fresh bandage over sacrum  PSYCH:  Appropriate mood and affect    Data Reviewed:  There are no new results to review at this time.  Previous records (including but not limited to H&P, progress notes, nursing notes, TOC management) were reviewed in assessment of this patient.  Labs: CBC: Recent Labs  Lab 04/17/24 0612 04/18/24 0728 04/19/24 0852  WBC 6.2 5.8 6.1  HGB 10.0* 10.1* 10.2*  HCT 31.1* 31.1* 32.0*  MCV 88.1 86.6 87.7  PLT 305 302 310   Basic Metabolic Panel: Recent Labs   Lab 04/17/24 0612 04/18/24 0728 04/19/24 0852  NA 141 139 139  K 3.9 4.1 4.0  CL 107 105 108  CO2 26 26 24   GLUCOSE 146* 140* 117*  BUN 30* 31* 30*  CREATININE 0.78 0.80 0.79  CALCIUM  8.2* 8.4* 8.4*  MG  --  1.8 1.9  PHOS  --   --  3.9   Liver Function Tests: No results for input(s): "AST", "ALT", "ALKPHOS", "BILITOT", "PROT", "ALBUMIN " in the last 168 hours. CBG: Recent Labs  Lab 04/21/24 1235 04/21/24 1641 04/21/24 2005 04/22/24 0815 04/22/24 1233  GLUCAP 174* 132* 153* 157* 171*    Scheduled Meds:  acetaminophen   500 mg Oral TID   amiodarone   200 mg Oral Daily   apixaban   5 mg Oral BID   atorvastatin   40 mg Oral Daily   Chlorhexidine  Gluconate Cloth  6 each Topical Daily   collagenase    Topical Daily   diclofenac  Sodium  4 g Topical QID   feeding supplement  237 mL Oral TID BM   ferrous sulfate   325 mg Oral QHS   furosemide   20 mg Oral Daily   guaiFENesin   600 mg Oral BID   insulin  aspart  0-15 Units Subcutaneous TID WC   insulin  aspart  0-5 Units Subcutaneous  QHS   insulin  aspart  4 Units Subcutaneous TID WC   insulin  glargine-yfgn  7 Units Subcutaneous Daily   levothyroxine   50 mcg Oral Q0600   liver oil-zinc  oxide   Topical BID   multivitamin with minerals  1 tablet Oral Daily   nutrition supplement (JUVEN)  1 packet Oral BID BM   mouth rinse  15 mL Mouth Rinse 4 times per day   oxyCODONE   10 mg Oral QHS   phosphorus  500 mg Oral TID   polyethylene glycol  17 g Oral BID   senna-docusate  1 tablet Oral QHS   sodium chloride  flush  3 mL Intravenous Q12H   Tafamidis   1 capsule Oral Daily   Continuous Infusions: PRN Meds:.acetaminophen  **OR** acetaminophen , food thickener, lip balm, ondansetron  **OR** ondansetron  (ZOFRAN ) IV, mouth rinse, oxyCODONE , polyethylene glycol, artificial tears, sodium chloride  flush  Family Communication: None at bedside  Disposition: Status is: Inpatient Remains inpatient appropriate because: Disposition,  sacral     Time spent: 33 minutes  Length of inpatient stay: 49 days  Author: Jodeane Mulligan, DO 04/22/2024 12:57 PM  For on call review [email or web address].

## 2024-04-22 NOTE — Plan of Care (Signed)
  Problem: Fluid Volume: Goal: Hemodynamic stability will improve Outcome: Progressing   Problem: Clinical Measurements: Goal: Diagnostic test results will improve Outcome: Progressing Goal: Signs and symptoms of infection will decrease Outcome: Progressing   Problem: Coping: Goal: Psychosocial and spiritual needs will be supported Outcome: Progressing   Problem: Respiratory: Goal: Will maintain a patent airway Outcome: Progressing Goal: Complications related to the disease process, condition or treatment will be avoided or minimized Outcome: Progressing   Problem: Coping: Goal: Ability to adjust to condition or change in health will improve Outcome: Progressing   Problem: Fluid Volume: Goal: Ability to maintain a balanced intake and output will improve Outcome: Progressing   Problem: Nutritional: Goal: Maintenance of adequate nutrition will improve Outcome: Progressing   Problem: Tissue Perfusion: Goal: Adequacy of tissue perfusion will improve Outcome: Progressing

## 2024-04-23 ENCOUNTER — Encounter (HOSPITAL_COMMUNITY): Payer: Self-pay | Admitting: Cardiology

## 2024-04-23 DIAGNOSIS — E43 Unspecified severe protein-calorie malnutrition: Secondary | ICD-10-CM | POA: Diagnosis not present

## 2024-04-23 DIAGNOSIS — L98429 Non-pressure chronic ulcer of back with unspecified severity: Secondary | ICD-10-CM | POA: Diagnosis not present

## 2024-04-23 DIAGNOSIS — R6520 Severe sepsis without septic shock: Secondary | ICD-10-CM | POA: Diagnosis not present

## 2024-04-23 LAB — GLUCOSE, CAPILLARY
Glucose-Capillary: 97 mg/dL (ref 70–99)
Glucose-Capillary: 200 mg/dL — ABNORMAL HIGH (ref 70–99)
Glucose-Capillary: 82 mg/dL (ref 70–99)
Glucose-Capillary: 112 mg/dL — ABNORMAL HIGH (ref 70–99)

## 2024-04-23 MED ORDER — ENSURE ENLIVE PO LIQD
237.0000 mL | Freq: Two times a day (BID) | ORAL | Status: DC
  Administered 2024-04-24 – 2024-04-30 (×10): 237 mL via ORAL
  Filled 2024-04-23 (×6): qty 237

## 2024-04-23 NOTE — Progress Notes (Signed)
 Nutrition Follow-up  DOCUMENTATION CODES:   Severe malnutrition in context of chronic illness  INTERVENTION:  Continue to encourage optimal nutritional intake Discontinue Mighty Shakes Continue Magic cup TID with meals, each supplement provides 290 kcal and 9 grams of protein Continue Ensure Enlive po decrease from TID to BID, each supplement provides 350 kcal and 20 grams of protein. Continue Juven BID to support wound healing Continue MVI with minerals daily  NUTRITION DIAGNOSIS:  Severe Malnutrition related to chronic illness as evidenced by energy intake < or equal to 75% for > or equal to 1 month, severe muscle depletion. - ongoing  GOAL:  Patient will meet greater than or equal to 90% of their needs - likely being met via meals and nutrition supplements  MONITOR:  PO intake, Supplement acceptance, Weight trends, Skin  REASON FOR ASSESSMENT:  Consult Assessment of nutrition requirement/status  ASSESSMENT:  83 year old male who presented to the ED on 03/04/24 from [facility] due to sacral pain. PMH of T2DM, HTN, HLD, CHF, atrial fibrillation, stroke with right-sided weakness, GERD, BPH, hypothyroidism, MGUS, transthyretin associated amyloid cardiomyopathy, chronic pancytopenia, recent hospital admission for COVID-19. Pt admitted with sepsis secondary to sacral pressure injury infection.  Pt remains inpatient for disposition planning and weaning of pain medication.   Pt resting soundly at time of visit.   Continues on dysphagia 1, thin liquid diet.  Tolerating well. No documented meals on file to review since 5/29 at which time he was notably consuming 100% x 2 recorded meals.  Review of MAR reflects pt receiving ensure and juven as scheduled.  Will reduce amount of nutrition supplements given high quantity of volume and pt appears to have improving nutritional intake of meals.   Medications: ferrous sulfate , lasix  20mg  daily, SSI 0-15 units TID, SSI 0-5 units daily, SSI  4 units TID, semglee  7 units daily, MVI, k phos  neutral, miralax  BID, senna daily  Labs:  CBG's 77-171 x24 hours  Diet Order:   Diet Order             DIET - DYS 1 Room service appropriate? Yes; Fluid consistency: Thin  Diet effective now                   EDUCATION NEEDS:   No education needs have been identified at this time  Skin:  Skin Assessment: Skin Integrity Issues: per WOC f/u 6/3 L hip PI unstageable- Measurement: 4 cm x 2.5 cm (100% cover by scab) L shoulder DTPI- Measurement: 3 cm x 2 cm (100% cover by scab) Sacral unstageable- Measurement: 8 x 5 x 1 cm. Tunneling 4 cm.  Right heel DTPI-Measurement: 2 x 1.2 cm (100% black eschar)  Last BM:  6/2  Height:   Ht Readings from Last 1 Encounters:  03/04/24 5\' 11"  (1.803 m)    Weight:   Wt Readings from Last 1 Encounters:  04/22/24 74.8 kg   BMI:  Body mass index is 23.01 kg/m.  Estimated Nutritional Needs:   Kcal:  2000-2200  Protein:  95-100 grams  Fluid:  >2.0 L  Earl [name], RDN, LDN Clinical Nutrition See AMiON for contact information.

## 2024-04-23 NOTE — Progress Notes (Signed)
 Physical Therapy Treatment Patient Details Name: Earl [name] MRN: [id number] DOB: Oct 26, 1941 Today's Date: 04/23/2024   History of Present Illness 83 y.o. male admitted from [facility] rehab on 03/04/24 with SOB, sacral pain, Afib with RVR. Pt with acute metabolic encephalopathy, rhabdomyolysis and sepsis secondary to sacral wound infection. 4/15 & 4/17 bedside sacral wound debridement. PMhx: adm 3/26-4/08/2024 with fall, AMS, COVID; d/c to SNF rehab. CVA (2021, residual R-side weakness), T2DM, HTN, CHF, MGUS, Afib on Eliquis .    PT Comments  Pt received in supine, pleasantly agreeable to therapy session after RN premedicated pt for pain and with good participation in all tasks. Pt able to perform sit<>stand x5 reps with Stedy with up to +2 maxA, with slightly improved knee/hip extension compared with previous session. Pt still quick to fatigue, standing ~5-10 seconds each repetition. Pt also performed rolling and transfer to/from L EOB with up to +2 maxA and seated reaching activity to promote anterior lean as pt has tendency to posterior lean with weak core strength. Patient will benefit from continued inpatient follow up therapy, <3 hours/day    If plan is discharge home, recommend the following: Two people to help with walking and/or transfers;Two people to help with bathing/dressing/bathroom;Assistance with feeding;Assist for transportation   Can travel by private vehicle     No  Equipment Recommendations  Hospital bed;Hoyer lift    Recommendations for Other Services       Precautions / Restrictions Precautions Precautions: Fall;Other (comment) Recall of Precautions/Restrictions: Intact Precaution/Restrictions Comments: significant pain from sacral wound; b/b incontinence Restrictions Weight Bearing Restrictions Per Provider Order: No     Mobility  Bed Mobility Overal bed mobility: Needs Assistance Bed Mobility: Rolling, Sidelying to Sit, Sit to Sidelying Rolling:  Used rails, Max assist Sidelying to sit: Used rails, +2 for physical assistance, Mod assist, HOB elevated   Sit to supine: Mod assist, HOB elevated, Used rails, +2 for safety/equipment Sit to sidelying: +2 for physical assistance, Mod assist, Used rails General bed mobility comments: L/R roll for bed pad adjustment; pt sitting up to L EOB    Transfers Overall transfer level: Needs assistance Equipment used: Ambulation equipment used Transfers: Sit to/from Stand Sit to Stand: Max assist, +2 physical assistance, From elevated surface, Via lift equipment           General transfer comment: elevated bed<>Stedy x5 trials, pt standing at least 75-80% upright on all trials, but still maintains slightly flexed knees/trunk. Pt maintains upright ~5-10 seconds at most prior to sitting back down, c/o fatigue/weakness. No dizziness expressed today; not able to stand fully enough for Stedy flaps to be safely placed for pt to sit up in [place]. Transfer via Lift Equipment: Stedy  Ambulation/Gait               General Gait Details: unable   Comptroller Bed    Modified Rankin (Stroke Patients Only)       Balance Overall balance assessment: Needs assistance Sitting-balance support: Bilateral upper extremity supported, Feet supported Sitting balance-Leahy Scale: Poor Sitting balance - Comments: posterior lean in sitting, more midline posture wtih BUE supported by Stedy rail Postural control: Posterior lean Standing balance support: Bilateral upper extremity supported, Reliant on assistive device for balance Standing balance-Leahy Scale: Zero Standing balance comment: max +2 assist not fully extended  Communication Communication Communication: Impaired Factors Affecting Communication: Reduced clarity of speech  Cognition Arousal: Alert Behavior During Therapy: WFL for tasks assessed/performed,  Lability   PT - Cognitive impairments: Problem solving, Sequencing, Memory, Safety/Judgement                       PT - Cognition Comments: pt pleasant and willing, slow processing. Pt appears very motivated today and proud of his progress, more confident with transfers this date. Following commands: Impaired Following commands impaired: Follows one step commands with increased time    Cueing Cueing Techniques: Verbal cues, Tactile cues, Gestural cues  Exercises Other Exercises Other Exercises: working midline sitting with safe UE placement and anterior/lateral reaching to encourage anterior lean, with single UE to reach ~5 reps each and BUE reaching a few reps    General Comments General comments (skin integrity, edema, etc.): no visible soiling on bed pads, per RN wound care nurse changed his dressings earlier      Pertinent Vitals/Pain Pain Assessment Pain Assessment: Faces Faces Pain Scale: Hurts little more Pain Location: sacrum, bottom and generalized Pain Descriptors / Indicators: Aching, Discomfort, Grimacing Pain Intervention(s): Limited activity within patient's tolerance, Premedicated before session, Monitored during session, Repositioned    Home Living                          Prior Function            PT Goals (current goals can now be found in the care plan section) Acute Rehab PT Goals Patient Stated Goal: To improve strength and mobility to reduce risk for falls. PT Goal Formulation: With patient Time For Goal Achievement: 04/25/24 Progress towards PT goals: Progressing toward goals    Frequency    Min 1X/week      PT Plan      Co-evaluation              AM-PAC PT "6 Clicks" Mobility   Outcome Measure  Help needed turning from your back to your side while in a flat bed without using bedrails?: A Lot Help needed moving from lying on your back to sitting on the side of a flat bed without using bedrails?: Total Help needed  moving to and from a bed to a chair (including a wheelchair)?: Total Help needed standing up from a chair using your arms (e.g., wheelchair or bedside chair)?: Total Help needed to walk in hospital room?: Total Help needed climbing 3-5 steps with a railing? : Total 6 Click Score: 7    End of Session Equipment Utilized During Treatment: Other (comment);Gait belt (bed pads) Activity Tolerance: Patient tolerated treatment well Patient left: in bed;with call bell/phone within reach;with bed alarm set;Other (comment) (bed in more upright chair posture, pillows under BUE for support) Nurse Communication: Mobility status;Need for lift equipment;Precautions PT Visit Diagnosis: Muscle weakness (generalized) (M62.81);Adult, failure to thrive (R62.7);Difficulty in walking, not elsewhere classified (R26.2);Pain;Other abnormalities of gait and mobility (R26.89) Pain - part of body:  (sacrum/bottom)     Time: 1610-9604 PT Time Calculation (min) (ACUTE ONLY): 34 min  Charges:    $Therapeutic Exercise: 8-22 mins $Therapeutic Activity: 8-22 mins PT General Charges $$ ACUTE PT VISIT: 1 Visit                     Madysin Crisp P., PTA Acute Rehabilitation Services Secure Chat Preferred 9a-5:30pm Office: [phone]    Mariel Shope [facility] 04/23/2024, 5:17 PM

## 2024-04-23 NOTE — Progress Notes (Signed)
 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 Critical care was consulted for persistent hypotension. Respiratory panel was negative. General surgery was consulted for sacral wound debridement with bedside excision on 03/05/2024 and 03/07/2024. He is now hemodynamically stable for discharge.    Assessment and Plan:   Severe sepsis sepsis - On presentation, lactic acidosis, leukocytosis, fever, secondary to infected chronic wound.  Blood cultures drawn, empiric IV fluid bolus, empiric antibiotics.  Sepsis now resolved.   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  Completed antibiotics 5/1.  General surgery now signed off.  Wound care following.  Infectious disease signed off.  Continue wound care management/regimen.  Offload pressure with every 2 hours turns, adequate nutrition.  Continues showing improvement.  Will continue current regimen:   Cleanse with Vashe#[id number]. Apply Aquacel Ag+ (silver hydrofiber) Lawson # [id number] in the wound bed and fill the tunneling using a swab (change daily), cover with sacral foam dressing, change daily and PRN soilage   Other pressure injuries -continue wound care, off loading, as ordered.  Going to be an ongoing struggle given pt's physical debilitation.     Acute  on chronic pain - secondary to above -Changed to scheduled oxycontin  10mg  at bedtime, with oxycodone  2.5mg  q4hr prn.  Appears to be tolerating well.  Pt doing well with weaning so far.  Eventual goal to wean off opioids entirely by discharge.  Next step to wean bedtime oxycontin , possibly with scheduled oxy 5mg  at night.   Paroxysmal atrial fibrillation - Continue amiodarone  and Eliquis .  Cardiology signed off.  Continues on telemetry due to daughter's preference.   HFrEF/cardiac amyloidosis/NICM - On tafamadis for amyloid.  GDMT not in place secondary to hypotension.  Cardiology signed off for now.  Appears to be tolerating discontinuation of midodrine .  Will give another day to monitor BP.  If possible, will attempt to initiate GDMT slowly.     Insulin -dependent diabetes mellitus - Continues on glargine 7 units daily plus insulin  sliding scale.   Anemia of chronic disease - S/p 1 unit PRBC 4/30.  Hemoglobin stable, no active bleeding.   Dysphagia/severe protein calorie malnutrition - Nutrition/dietary following.  3 times daily protein calorie supplementation.  Vitamin supplementation.  Continues on pured diet.  Pt showing improvement in appetite.  Ideally would benefit from advancement of diet if possible per SLP.   Physical debilitation of muscle weakness - PT/OT on board.  Pt is motivated.  However, limited ability to sit upright in chair secondary to pain from sacral ulcer.  Continue to encourage participation.  Pain appears to be limiting factor as well as overall muscle strength and weakness.   Goals of care - Patient is medically appropriate for discharge at this time.  Working closely with TOC family on disposition planning.  Patient's daughter wants to be absolutely sure wound is healing prior to transition out of the hospital.  However given his physical ability, STR or fulltime care would likely be required.   Discussion today 5/31 about goals and disposition.  Ideally her goal  is to have his sacral wound nearly completely healed prior to discharge being a stage I or small stage II.  Given his rehospitalization secondary to sepsis from infected sacral ulcer, she is hesitant to transition to STR where care may be limited and recurrence of infection becomes more likely.  This is understandable and agreeable from my standpoint.  As his ulcer improves his pain will improve, as pain improves his PT will improve.  There is frank understanding that patient will likely not rebound to his previous independence however would like to optimize his health is much as possible prior to transition to PT rehab.  6/3 - Discussion with TOC about possibility of CIR as an option for rehab while still maintaining hospital care.  Unclear if pt is even a candidate or not, but would be worth exploring as an option given his improvement with his wound and pain.   Subjective: Patient doing well this morning, laying in bed, eating breakfast. Denies any fever, chills, chest pain, nausea, vomiting, abdominal pain.  States his pain is well controlled.  Ate all of his breakfast this morning.   Physical Exam:  Vitals:   04/22/24 2011 04/23/24 0044 04/23/24 0422 04/23/24 0816  BP: 126/81 116/77 121/80 115/76  Pulse: 80 81 79 78  Resp: 20 19 19 18   Temp: (!) 97.5 F (36.4 C) 98.4 F (36.9 C) (!) 97.4 F (36.3 C) 97.7 F (36.5 C)  TempSrc:    Oral  SpO2: 100% 100% 100% 100%  Weight:      Height:        GENERAL:  Alert, pleasant, no acute distress, frail HEENT:  EOMI CARDIOVASCULAR:  RRR, no murmurs appreciated RESPIRATORY:  Clear to auscultation, no wheezing, rales, or rhonchi GASTROINTESTINAL:  Soft, nontender, nondistended EXTREMITIES:  No LE edema bilaterally NEURO:  No new focal deficits appreciated SKIN: Fresh bandage over sacrum  PSYCH:  Appropriate mood and affect    Data Reviewed:  There are no new results to review at this time.  Previous records (including but not limited to  H&P, progress notes, nursing notes, TOC management) were reviewed in assessment of this patient.  Labs: CBC: Recent Labs  Lab 04/17/24 0612 04/18/24 0728 04/19/24 0852  WBC 6.2 5.8 6.1  HGB 10.0* 10.1* 10.2*  HCT 31.1* 31.1* 32.0*  MCV 88.1 86.6 87.7  PLT 305 302 310   Basic Metabolic Panel: Recent Labs  Lab 04/17/24 0612 04/18/24 0728 04/19/24 0852  NA 141 139 139  K 3.9 4.1 4.0  CL 107 105 108  CO2 26 26 24   GLUCOSE 146* 140* 117*  BUN 30* 31* 30*  CREATININE 0.78 0.80 0.79  CALCIUM  8.2* 8.4* 8.4*  MG  --  1.8 1.9  PHOS  --   --  3.9   Liver Function Tests: No results for input(s): "AST", "ALT", "ALKPHOS", "BILITOT", "PROT", "ALBUMIN " in the last 168 hours. CBG: Recent Labs  Lab 04/22/24 1233 04/22/24 1634 04/22/24 2013 04/23/24 0750 04/23/24 1210  GLUCAP 171* 111* 77 97 200*    Scheduled Meds:  acetaminophen   500 mg Oral TID   amiodarone   200 mg Oral Daily   apixaban   5 mg Oral BID   atorvastatin   40 mg Oral Daily   Chlorhexidine  Gluconate Cloth  6 each Topical Daily   collagenase    Topical Daily  diclofenac  Sodium  4 g Topical QID   feeding supplement  237 mL Oral TID BM   ferrous sulfate   325 mg Oral QHS   furosemide   20 mg Oral Daily   guaiFENesin   600 mg Oral BID   insulin  aspart  0-15 Units Subcutaneous TID WC   insulin  aspart  0-5 Units Subcutaneous QHS   insulin  aspart  4 Units Subcutaneous TID WC   insulin  glargine-yfgn  7 Units Subcutaneous Daily   levothyroxine   50 mcg Oral Q0600   liver oil-zinc  oxide   Topical BID   multivitamin with minerals  1 tablet Oral Daily   nutrition supplement (JUVEN)  1 packet Oral BID BM   mouth rinse  15 mL Mouth Rinse 4 times per day   oxyCODONE   10 mg Oral QHS   phosphorus  500 mg Oral TID   polyethylene glycol  17 g Oral BID   senna-docusate  1 tablet Oral QHS   sodium chloride  flush  3 mL Intravenous Q12H   Tafamidis   1 capsule Oral Daily   Continuous Infusions: PRN Meds:.acetaminophen  **OR**  acetaminophen , food thickener, lip balm, ondansetron  **OR** ondansetron  (ZOFRAN ) IV, mouth rinse, oxyCODONE , polyethylene glycol, artificial tears, sodium chloride  flush  Family Communication: None at bedside.  Will attempt again later today or call.  Disposition: Status is: Inpatient Remains inpatient appropriate because: disposition     Time spent: 39 minutes  Length of inpatient stay: 50 days  Author: Jodeane Mulligan, DO 04/23/2024 1:26 PM  For on call review [email or web address].

## 2024-04-23 NOTE — Consult Note (Signed)
 WOC Nurse wound follow up Wound type: Multiple wounds. Pressure injuries   1 - Left Hip PI unstageable Wound bed: 100% cover by scab, blister ruptured, partial raised skin surrounding. Measurement: 4 cm x 2.5 cm Drainage (amount, consistency, odor) none Periwound: intact, healed tissue. Dressing procedure/placement/frequency: Apply Santyl  on the wound bed, cover with foam dressing, change daily.   2 - Left shoulder DTPI Measurement: 3 cm x 2 cm Wound bed: 100% cover by scab, partial raised skin surrounding. Drainage (amount, consistency, odor) none Periwound: intact Dressing procedure/placement/frequency: Apply Santyl  (change daily) and cover with foam dressing, change every 3 days or PRN.    3 - Sacral Unstageable PI Measurement: 8 x 5 x 1 cm. Tunneling 4 cm. Wound bed: 100% beefy red. Tunneling at 12 o'clock position with 4 cm dept. Drainage (amount, consistency, odor) Moderate amount, no odor, serous. Periwound: intacts, white scar tissue surrounding, viable edges. Dressing procedure/placement/frequency: Cleanse with Vashe#[id number]. Apply Aquacel (#[id number]) inside the wound bed, filling the tunneling (change daily), cover with sacral foam dressing, change daily.   4 - Right heel DTPI Measurement: 2 x 1.2 cm Wound bed: 100% black eschar, black/purple coloration. Drainage (amount, consistency, odor) scant amount, no odor. Periwound: Intact, partial keratosis. Dressing procedure/placement/frequency: Apply Santyl  on the wound bed (change daily). Protect with foam dressing, change every 3 days or PRN.  Obs: Left heel with no injuries - Protect with foam dressing. Use Prevalon boots in both heels.   5/6 Right great toe: distal end 0.2 cm scabbed, skin intact. Left great toe 0.3 cm scabbed, skin intact. Dressing procedure/placement/frequency: Leave open air.   WOC team will follow weekly.   Please reconsult if further assistance is needed. Thank-you,  Rachel Budds BSN,  RN, [employer], WOC  (Pager: [phone])

## 2024-04-24 ENCOUNTER — Inpatient Hospital Stay (HOSPITAL_COMMUNITY)

## 2024-04-24 DIAGNOSIS — I480 Paroxysmal atrial fibrillation: Secondary | ICD-10-CM | POA: Diagnosis not present

## 2024-04-24 DIAGNOSIS — I483 Typical atrial flutter: Secondary | ICD-10-CM | POA: Diagnosis not present

## 2024-04-24 DIAGNOSIS — L98429 Non-pressure chronic ulcer of back with unspecified severity: Secondary | ICD-10-CM | POA: Diagnosis not present

## 2024-04-24 DIAGNOSIS — A419 Sepsis, unspecified organism: Secondary | ICD-10-CM | POA: Diagnosis not present

## 2024-04-24 LAB — CBC
HCT: 32.2 % — ABNORMAL LOW (ref 39.0–52.0)
Hemoglobin: 10.3 g/dL — ABNORMAL LOW (ref 13.0–17.0)
MCH: 28.1 pg (ref 26.0–34.0)
MCHC: 32 g/dL (ref 30.0–36.0)
MCV: 88 fL (ref 80.0–100.0)
Platelets: 307 10*3/uL (ref 150–400)
RBC: 3.66 MIL/uL — ABNORMAL LOW (ref 4.22–5.81)
RDW: 16.5 % — ABNORMAL HIGH (ref 11.5–15.5)
WBC: 6 10*3/uL (ref 4.0–10.5)
nRBC: 0 % (ref 0.0–0.2)

## 2024-04-24 LAB — GLUCOSE, CAPILLARY
Glucose-Capillary: 107 mg/dL — ABNORMAL HIGH (ref 70–99)
Glucose-Capillary: 153 mg/dL — ABNORMAL HIGH (ref 70–99)
Glucose-Capillary: 132 mg/dL — ABNORMAL HIGH (ref 70–99)
Glucose-Capillary: 128 mg/dL — ABNORMAL HIGH (ref 70–99)

## 2024-04-24 LAB — BASIC METABOLIC PANEL WITH GFR
Anion gap: 10 (ref 5–15)
BUN: 30 mg/dL — ABNORMAL HIGH (ref 8–23)
CO2: 20 mmol/L — ABNORMAL LOW (ref 22–32)
Calcium: 8.4 mg/dL — ABNORMAL LOW (ref 8.9–10.3)
Chloride: 111 mmol/L (ref 98–111)
Creatinine, Ser: 0.85 mg/dL (ref 0.61–1.24)
GFR, Estimated: >60 mL/min (ref >60)
Glucose, Bld: 91 mg/dL (ref 70–99)
Potassium: 3.6 mmol/L (ref 3.5–5.1)
Sodium: 141 mmol/L (ref 135–145)

## 2024-04-24 LAB — MAGNESIUM
Magnesium: 1.8 mg/dL (ref 1.7–2.4)
Magnesium: 1.9 mg/dL (ref 1.7–2.4)

## 2024-04-24 MED ORDER — FUROSEMIDE 10 MG/ML IJ SOLN
20.0000 mg | Freq: Once | INTRAMUSCULAR | Status: AC
  Administered 2024-04-24: 20 mg via INTRAVENOUS
  Filled 2024-04-24: qty 2

## 2024-04-24 MED ORDER — POTASSIUM CHLORIDE 20 MEQ PO PACK
40.0000 meq | PACK | Freq: Once | ORAL | Status: AC
  Administered 2024-04-24: 40 meq via ORAL
  Filled 2024-04-24: qty 2

## 2024-04-24 NOTE — Progress Notes (Addendum)
 Upon assessment tonight of patients wounds with daughter at bedside removed all dressings to document dressing date and time.   1-L hip DTPI- covered with boggy eschar and slough yellowish and green in color.  Surrounded by small layer of granulation tissue on outside of the wound. Epibole wound edges noted (see picture taken in media)- Concern for infection given malodorous smell with scant drainage on foam when removing the foam boarder dressing. Cleansed the wound with vache applied debridement agent and covered with foam dressing. MD notified of concerns.    2- L shoulder DTPI- hard eschar noted on 100% of wound bed. Cleansed. Applied debridement agent and recovered with foam boarder dressing.   3. DTPI R heel- applied debridement agent and placed new heel foam boarder dressing.  4. Sacral wound- malodorous when removing dressing with moderate amount of purulent drainage noted on aquacel ag piece that was packed into wound. As well as on the foam boarder dressing. Slough noted inside the wound closest to tunneling. See image attached in media. Cleansed the wound with vache and pack kerlix soaked moist-->dry dressing in tunneling as well as covered with aquacel AG and silicone foam boarder dressing.   All dressings dated and timed with RN initial.  **To be noted patient has also had increased amount of pain at the L hip area which raises concerns given the current state of L hip wound.

## 2024-04-24 NOTE — Progress Notes (Signed)
 Occupational Therapy Treatment Patient Details Name: Earl [name] MRN: [id number] DOB: 1941-03-06 Today's Date: 04/24/2024   History of present illness 83 y.o. male admitted from [facility] rehab on 03/04/24 with SOB, sacral pain, Afib with RVR. Pt with acute metabolic encephalopathy, rhabdomyolysis and sepsis secondary to sacral wound infection. 4/15 & 4/17 bedside sacral wound debridement. PMhx: adm 3/26-4/08/2024 with fall, AMS, COVID; d/c to SNF rehab. CVA (2021, residual R-side weakness), T2DM, HTN, CHF, MGUS, Afib on Eliquis .   OT comments  Pt progressing toward goals this session, able to perform x6 sit to stands in stedy with max +2, longest stand being 10 seconds. Pt max +2 for bed mobility. Educated on BLE exercises pt can perform in the bed to work on strengthening. Also administered squeeze ball for pt as pt now with improved grasp, pt happy to have "graduated" from the foam cubes. More red tubing also provided for self-feeding. Pt presenting with impairments listed below, will follow acutely. Patient will benefit from continued inpatient follow up therapy, <3 hours/day to maximize safety/ind with ADL/functional mobility.       If plan is discharge home, recommend the following:  A lot of help with walking and/or transfers;A lot of help with bathing/dressing/bathroom;Assistance with cooking/housework;Assist for transportation;Help with stairs or ramp for entrance;Two people to help with walking and/or transfers   Equipment Recommendations  Other (comment) (defer)    Recommendations for Other Services      Precautions / Restrictions Precautions Precautions: Fall;Other (comment) Recall of Precautions/Restrictions: Intact Precaution/Restrictions Comments: significant pain from sacral wound; b/b incontinence Restrictions Weight Bearing Restrictions Per Provider Order: No       Mobility Bed Mobility Overal bed mobility: Needs Assistance Bed Mobility: Rolling,  Sidelying to Sit, Sit to Sidelying Rolling: Max assist, +2 for physical assistance Sidelying to sit: Used rails, +2 for physical assistance, Mod assist, HOB elevated     Sit to sidelying: +2 for physical assistance, Mod assist, Used rails      Transfers Overall transfer level: Needs assistance Equipment used: Ambulation equipment used Transfers: Sit to/from Stand Sit to Stand: Max assist, +2 physical assistance, From elevated surface, Via Scientist, product/process development via Lift Equipment: Stedy   Balance Overall balance assessment: Needs assistance Sitting-balance support: Bilateral upper extremity supported, Feet supported Sitting balance-Leahy Scale: Poor Sitting balance - Comments: posterior lean in sitting, more midline posture wtih BUE supported by [employer] rail Postural control: Posterior lean Standing balance support: Bilateral upper extremity supported, Reliant on assistive device for balance Standing balance-Leahy Scale: Zero                             ADL either performed or assessed with clinical judgement   ADL Overall ADL's : Needs assistance/impaired Eating/Feeding: Set up;Bed level                       Toilet Transfer: Maximal assistance;+2 for physical assistance Toilet Transfer Details (indicate cue type and reason): x6 standing attempts in stedy frame                Extremity/Trunk Assessment Upper Extremity Assessment Upper Extremity Assessment: Generalized weakness   Lower Extremity Assessment Lower Extremity Assessment: Defer to PT evaluation        Vision   Vision Assessment?: No apparent visual deficits   Perception Perception Perception: Not tested   Praxis Praxis  Praxis: Not tested   Communication Communication Communication: Impaired Factors Affecting Communication: Reduced clarity of speech   Cognition Arousal: Alert Behavior During Therapy: WFL for tasks assessed/performed, Lability Cognition:  No apparent impairments                               Following commands: Impaired Following commands impaired: Follows one step commands with increased time      Cueing   Cueing Techniques: Verbal cues, Tactile cues, Gestural cues  Exercises      Shoulder Instructions       General Comments VSS on RA    Pertinent Vitals/ Pain       Pain Assessment Pain Assessment: No/denies pain  Home Living                                          Prior Functioning/Environment              Frequency  Min 2X/week        Progress Toward Goals  OT Goals(current goals can now be found in the care plan section)  Progress towards OT goals: Progressing toward goals  Acute Rehab OT Goals Patient Stated Goal: none stated OT Goal Formulation: With patient Time For Goal Achievement: 04/30/24 Potential to Achieve Goals: Fair ADL Goals Pt Will Perform Eating: with supervision;with adaptive utensils;sitting;bed level Pt Will Perform Grooming: with supervision;bed level;with adaptive equipment Pt Will Perform Upper Body Bathing: with min assist;bed level Pt Will Perform Upper Body Dressing: with max assist;with mod assist;bed level Pt Will Perform Lower Body Dressing: sit to/from stand;with contact guard assist Pt Will Transfer to Toilet: with contact guard assist;ambulating;regular height toilet Pt Will Perform Toileting - Clothing Manipulation and hygiene: sit to/from stand;with contact guard assist Pt/caregiver will Perform Home Exercise Program: Both right and left upper extremity;With minimal assist;Increased ROM;Increased strength;With written HEP provided Additional ADL Goal #1: Pt will perform bed mobility with mod A for repositioning in order to prevent skin breakdown  Plan      Co-evaluation                 AM-PAC OT "6 Clicks" Daily Activity     Outcome Measure   Help from another person eating meals?: A Little Help from another  person taking care of personal grooming?: A Little Help from another person toileting, which includes using toliet, bedpan, or urinal?: A Lot Help from another person bathing (including washing, rinsing, drying)?: A Lot Help from another person to put on and taking off regular upper body clothing?: A Lot Help from another person to put on and taking off regular lower body clothing?: Total 6 Click Score: 13    End of Session Equipment Utilized During Treatment: Other (comment) (stedy)  OT Visit Diagnosis: Other abnormalities of gait and mobility (R26.89);Muscle weakness (generalized) (M62.81);History of falling (Z91.81)   Activity Tolerance Patient limited by pain   Patient Left in bed;with call bell/phone within reach;with family/visitor present   Nurse Communication Mobility status        Time: 1610-9604 OT Time Calculation (min): 29 min  Charges: OT General Charges $OT Visit: 1 Visit OT Treatments $Self Care/Home Management : 23-37 mins  Martie Fulgham K, OTD, OTR/L SecureChat Preferred Acute Rehab [phone]   Benedict Brain Koonce 04/24/2024, 4:16 PM

## 2024-04-24 NOTE — Consult Note (Addendum)
 WOC Nurse Consult Note: Reason for Consult: re-consult requested to assess sacrum wound and left hip wound related to a reported decline in appearance and increased odor and drainage according to bedside nurse's notes from last night.  Met with daughter at bedside and visualized both wounds, discussed dressing change plan and preformed dressing change to L hip and sacral wound. Wound type:  1- left hip unstageable  Wound bed is 100% covered with eschar  Pressure Injury POA: No Measurement: see prior note dated 6/3 Drainage (amount, consistency, odor); purulent, minimal Periwound: pink scar tissue noted surrounding eschar 2- Sacral- stage 4   Wound bed 90% red, clean, moist tissue, 10% scattered yellow slough, undermining 12:00 to 3:00 oclock, 3cm depth. Pressure Injury POA: yes Measurement: see prior note dated 6/3 Drainage (amount, consistency, odor); purulent, minimal, odor could not be detected as patient had a recent stooling episode that masked any potential odor. Periwound: intact Dressing procedure/placement/frequency: 1 - Left Hip PI unstageable Clean with Vashe ( lawson # [id number]), allow to air dry, apply santyl  to wound bed, cover with saline soaked gauze, and then cover with foam dressing, change daily   2 - Left shoulder DTPI Clean with Vashe ( lawson # [id number]), allow to air dry, apply santyl  to wound bed, cover with saline soaked gauze, and then cover with foam dressing, change daily  3 - Sacral Unstageable PI Cleanse with Vashe#[id number]. Apply Aquacel Ag+ (silver hydrofiber) Lawson # [id number] in the wound bed and fill the tunneling using a swab, cover with sacral foam dressing, change daily and PRN soilage  MD requested a wound culture, culture collected of wound bed during dressing change procedure and provided to RN.    Both feet should be placed in Prevalon boots to offload pressure Timm Foot [id number])  This treatment plan was discussed with daughter at bedside, discussed goal  and timeline of enzymatic debridement for shoulder and hip wounds, and goal of exudate management and removal of slough in sacral wound. All questions answered.  WOC team will continue to follow patient weekly.  Please re-consult if new needs arise     Gillermo Lack, RN, MSN, [facility] WOC Team

## 2024-04-24 NOTE — Progress Notes (Signed)
 Progress Note   Patient: Earl [name] ZOX:[id number] DOB: 1941/11/16 DOA: 03/04/2024  DOS: the patient was seen and examined on 04/24/2024   Brief hospital course:  83 yrs old male with past medical history of paroxysmal atrial fibrillation on Eliquis , insulin -dependent diabetes mellitus type 2, essential hypertension, CVA with right residual weakness, MGUS, BPH, HFr EF of 40%, He was discharged, recently from [facility] after admitted for acute metabolic encephalopathy, rhabdomyolysis and orthostatic, chronic pancytopenia presents to the ED from skilled nursing facility for shortness of breath, sacral pain, In the ED, he was found to be in RVR, tachypneic with saturations greater than 90%. Critical care was consulted for persistent hypotension. Respiratory panel was negative. General surgery was consulted for sacral wound debridement with bedside excision on 03/05/2024 and 03/07/2024. He is now hemodynamically stable for discharge.   6/4: Some concern of increasing discharge and some order from wound so wound care was reconsulted-repeat wound cultures were sent.  Daughter concerned of some shortness of breath which was not observed during rounds, chest x-ray with mild bibasilar subsegmental atelectasis or edema with small pleural effusions.  Remained on room air.   Assessment and Plan:   Severe sepsis  - On presentation, lactic acidosis, leukocytosis, fever, secondary to infected chronic wound.  Blood cultures drawn, empiric IV fluid bolus, empiric antibiotics.  Sepsis now resolved.   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  Completed antibiotics 5/1.  General surgery now signed off.  Wound care following.  Infectious disease signed off.  Continue wound care management/regimen.  Offload pressure with every 2 hours turns, adequate nutrition.  Continues showing improvement.  Will continue current regimen:   Cleanse with Vashe#[id number]. Apply  Aquacel Ag+ (silver hydrofiber) Lawson # [id number] in the wound bed and fill the tunneling using a swab (change daily), cover with sacral foam dressing, change daily and PRN soilage - Repeat cultures were sent today   Other pressure injuries -continue wound care, off loading, as ordered.  Going to be an ongoing struggle given pt's physical debilitation.     Acute on chronic pain - secondary to above -Changed to scheduled oxycontin  10mg  at bedtime, with oxycodone  2.5mg  q4hr prn.  Appears to be tolerating well.  Pt doing well with weaning so far.  Eventual goal to wean off opioids entirely by discharge.  Next step to wean bedtime oxycontin , possibly with scheduled oxy 5mg  at night.   Paroxysmal atrial fibrillation - Continue amiodarone  and Eliquis .  Cardiology signed off.  Continues on telemetry due to daughter's preference.   HFrEF/cardiac amyloidosis/NICM - On tafamadis for amyloid.  GDMT not in place secondary to hypotension.  Cardiology signed off for now.  Appears to be tolerating discontinuation of midodrine .  Will give another day to monitor BP.  If possible, will attempt to initiate GDMT slowly.     Insulin -dependent diabetes mellitus - Continues on glargine 7 units daily plus insulin  sliding scale.   Anemia of chronic disease - S/p 1 unit PRBC 4/30.  Hemoglobin stable, no active bleeding.   Dysphagia/severe protein calorie malnutrition - Nutrition/dietary following.  3 times daily protein calorie supplementation.  Vitamin supplementation.  Continues on pured diet.  Pt showing improvement in appetite.  Ideally would benefit from advancement of diet if possible per SLP.   Physical debilitation of muscle weakness - PT/OT on board.  Pt is motivated.  However, limited ability to sit upright in chair secondary to pain from sacral ulcer.  Continue to encourage  participation.  Pain appears to be limiting factor as well as overall muscle strength and weakness.   Goals of care - Patient is  medically appropriate for discharge at this time.  Working closely with TOC family on disposition planning.  Patient's daughter wants to be absolutely sure wound is healing prior to transition out of the hospital.  However given his physical ability, STR or fulltime care would likely be required.   Discussion today 5/31 about goals and disposition.  Ideally her goal is to have his sacral wound nearly completely healed prior to discharge being a stage I or small stage II.  Given his rehospitalization secondary to sepsis from infected sacral ulcer, she is hesitant to transition to STR where care may be limited and recurrence of infection becomes more likely.  This is understandable and agreeable from my standpoint.  As his ulcer improves his pain will improve, as pain improves his PT will improve.  There is frank understanding that patient will likely not rebound to his previous independence however would like to optimize his health is much as possible prior to transition to PT rehab.  6/3 - Discussion with TOC about possibility of CIR as an option for rehab while still maintaining hospital care.  Unclear if pt is even a candidate or not, but would be worth exploring as an option given his improvement with his wound and pain.   Subjective: Patient was seen and examined today.  Lying comfortably in bed.  Denies any shortness of breath or pain  Physical Exam:  Vitals:   04/23/24 1958 04/24/24 0410 04/24/24 0818 04/24/24 1653  BP: 112/61 134/80 124/75 125/71  Pulse: 72 84 73 80  Resp: 18 18 19 19   Temp: 97.6 F (36.4 C) 97.7 F (36.5 C) 97.8 F (36.6 C) 97.7 F (36.5 C)  TempSrc:      SpO2: 100% 98% 100% 98%  Weight:      Height:       General.  Frail and malnourished elderly man, in no acute distress. Pulmonary.  Lungs clear bilaterally, normal respiratory effort. CV.  Regular rate and rhythm, no JVD, rub or murmur. Abdomen.  Soft, nontender, nondistended, BS positive. CNS.  Alert and  oriented .  No focal neurologic deficit. Extremities.  No edema, no cyanosis, pulses intact and symmetrical.   Data Reviewed: Prior data reviewed  Labs: CBC: Recent Labs  Lab 04/18/24 0728 04/19/24 0852 04/24/24 0558  WBC 5.8 6.1 6.0  HGB 10.1* 10.2* 10.3*  HCT 31.1* 32.0* 32.2*  MCV 86.6 87.7 88.0  PLT 302 310 307   Basic Metabolic Panel: Recent Labs  Lab 04/18/24 0728 04/19/24 0852 04/24/24 0558  NA 139 139 141  K 4.1 4.0 3.6  CL 105 108 111  CO2 26 24 20*  GLUCOSE 140* 117* 91  BUN 31* 30* 30*  CREATININE 0.80 0.79 0.85  CALCIUM  8.4* 8.4* 8.4*  MG 1.8 1.9 1.8  PHOS  --  3.9  --    Liver Function Tests: No results for input(s): "AST", "ALT", "ALKPHOS", "BILITOT", "PROT", "ALBUMIN " in the last 168 hours. CBG: Recent Labs  Lab 04/23/24 1735 04/23/24 2045 04/24/24 0816 04/24/24 1306 04/24/24 1654  GLUCAP 82 112* 107* 153* 132*    Scheduled Meds:  acetaminophen   500 mg Oral TID   amiodarone   200 mg Oral Daily   apixaban   5 mg Oral BID   atorvastatin   40 mg Oral Daily   Chlorhexidine  Gluconate Cloth  6 each Topical Daily  collagenase    Topical Daily   diclofenac  Sodium  4 g Topical QID   feeding supplement  237 mL Oral BID BM   ferrous sulfate   325 mg Oral QHS   furosemide   20 mg Oral Daily   guaiFENesin   600 mg Oral BID   insulin  aspart  0-15 Units Subcutaneous TID WC   insulin  aspart  0-5 Units Subcutaneous QHS   insulin  aspart  4 Units Subcutaneous TID WC   insulin  glargine-yfgn  7 Units Subcutaneous Daily   levothyroxine   50 mcg Oral Q0600   liver oil-zinc  oxide   Topical BID   multivitamin with minerals  1 tablet Oral Daily   nutrition supplement (JUVEN)  1 packet Oral BID BM   mouth rinse  15 mL Mouth Rinse 4 times per day   oxyCODONE   10 mg Oral QHS   phosphorus  500 mg Oral TID   polyethylene glycol  17 g Oral BID   senna-docusate  1 tablet Oral QHS   sodium chloride  flush  3 mL Intravenous Q12H   Tafamidis   1 capsule Oral Daily    Continuous Infusions: PRN Meds:.acetaminophen  **OR** acetaminophen , food thickener, lip balm, ondansetron  **OR** ondansetron  (ZOFRAN ) IV, mouth rinse, oxyCODONE , polyethylene glycol, artificial tears, sodium chloride  flush  Family Communication: No family at bedside  Disposition: Status is: Inpatient Remains inpatient appropriate because: disposition  DVT prophylaxis.  Eliquis  Time spent: 50 minutes  Length of inpatient stay: 51 days  Author: Luna Salinas, MD 04/24/2024 5:01 PM  For on call review [email or web address].

## 2024-04-24 NOTE — Consult Note (Addendum)
 WOC Nurse follow-up consult Note: Re-consult requested to assess sacrum wound and left hip wound related to a reported decline in appearance and increased odor and drainage, according to the bedside nurse's notes from last night.   Refer to [facility] consult note; assessment of all wounds was performed yesterday (6/3) and daughter was not present at that time.   Attempted to see the patient while daughter was present today as requested. Discussed via Secure Chat with bedside nurse. Daughter has not been at the bedside this morning. WOC team will attempt a visit if she comes in today before 3:00 to assess wounds and discuss topical treatment recommendations. If it is not possible to meet together today, then WOC team will re-consult tomorrow. (Thurs)  Thank-you,  Wiliam Harder MSN, RN, CWOCN, [place], CNS [phone]

## 2024-04-24 NOTE — Progress Notes (Addendum)
 Pt has had increased WOB at time and appears tachypneic even at rest. Vitals stable and oxygenating on room air.   Respiratory Rate can be shallow and rapid 24-26. Pt may benefit from CXR or CT to evaluate pulmonary source for increased work of breathing. Lungs diminished. On call MD notified of wound finding/ concerns was told to pass to day shift provider for notification. See previous note regarding wounds.   SCD's placed on patient.

## 2024-04-25 DIAGNOSIS — I483 Typical atrial flutter: Secondary | ICD-10-CM | POA: Diagnosis not present

## 2024-04-25 DIAGNOSIS — A419 Sepsis, unspecified organism: Secondary | ICD-10-CM | POA: Diagnosis not present

## 2024-04-25 DIAGNOSIS — L98429 Non-pressure chronic ulcer of back with unspecified severity: Secondary | ICD-10-CM | POA: Diagnosis not present

## 2024-04-25 DIAGNOSIS — I480 Paroxysmal atrial fibrillation: Secondary | ICD-10-CM | POA: Diagnosis not present

## 2024-04-25 LAB — GLUCOSE, CAPILLARY
Glucose-Capillary: 94 mg/dL (ref 70–99)
Glucose-Capillary: 221 mg/dL — ABNORMAL HIGH (ref 70–99)
Glucose-Capillary: 217 mg/dL — ABNORMAL HIGH (ref 70–99)
Glucose-Capillary: 145 mg/dL — ABNORMAL HIGH (ref 70–99)

## 2024-04-25 NOTE — Plan of Care (Signed)
  Problem: Fluid Volume: Goal: Hemodynamic stability will improve Outcome: Progressing   Problem: Clinical Measurements: Goal: Diagnostic test results will improve Outcome: Progressing Goal: Signs and symptoms of infection will decrease Outcome: Progressing   Problem: Respiratory: Goal: Ability to maintain adequate ventilation will improve Outcome: Progressing   Problem: Education: Goal: Knowledge of risk factors and measures for prevention of condition will improve Outcome: Progressing   Problem: Coping: Goal: Psychosocial and spiritual needs will be supported Outcome: Progressing   Problem: Respiratory: Goal: Will maintain a patent airway Outcome: Progressing Goal: Complications related to the disease process, condition or treatment will be avoided or minimized Outcome: Progressing   Problem: Education: Goal: Ability to describe self-care measures that may prevent or decrease complications (Diabetes Survival Skills Education) will improve Outcome: Progressing   Problem: Coping: Goal: Ability to adjust to condition or change in health will improve Outcome: Progressing   Problem: Fluid Volume: Goal: Ability to maintain a balanced intake and output will improve Outcome: Progressing   Problem: Health Behavior/Discharge Planning: Goal: Ability to manage health-related needs will improve Outcome: Progressing   Problem: Metabolic: Goal: Ability to maintain appropriate glucose levels will improve Outcome: Progressing   Problem: Nutritional: Goal: Maintenance of adequate nutrition will improve Outcome: Progressing Goal: Progress toward achieving an optimal weight will improve Outcome: Progressing   Problem: Skin Integrity: Goal: Risk for impaired skin integrity will decrease Outcome: Progressing   Problem: Tissue Perfusion: Goal: Adequacy of tissue perfusion will improve Outcome: Progressing   Problem: Education: Goal: Knowledge of General Education information  will improve Description: Including pain rating scale, medication(s)/side effects and non-pharmacologic comfort measures Outcome: Progressing   Problem: Health Behavior/Discharge Planning: Goal: Ability to manage health-related needs will improve Outcome: Progressing   Problem: Clinical Measurements: Goal: Ability to maintain clinical measurements within normal limits will improve Outcome: Progressing Goal: Will remain free from infection Outcome: Progressing Goal: Diagnostic test results will improve Outcome: Progressing Goal: Respiratory complications will improve Outcome: Progressing Goal: Cardiovascular complication will be avoided Outcome: Progressing   Problem: Activity: Goal: Risk for activity intolerance will decrease Outcome: Progressing   Problem: Nutrition: Goal: Adequate nutrition will be maintained Outcome: Progressing   Problem: Coping: Goal: Level of anxiety will decrease Outcome: Progressing   Problem: Elimination: Goal: Will not experience complications related to bowel motility Outcome: Progressing Goal: Will not experience complications related to urinary retention Outcome: Progressing   Problem: Pain Managment: Goal: General experience of comfort will improve and/or be controlled Outcome: Progressing   Problem: Safety: Goal: Ability to remain free from injury will improve Outcome: Progressing   Problem: Skin Integrity: Goal: Risk for impaired skin integrity will decrease Outcome: Progressing

## 2024-04-25 NOTE — Progress Notes (Signed)
 Progress Note   Patient: Earl [name] UJW:[id number] DOB: 1941-03-04 DOA: 03/04/2024  DOS: the patient was seen and examined on 04/25/2024   Brief hospital course:  83 yrs old male with past medical history of paroxysmal atrial fibrillation on Eliquis , insulin -dependent diabetes mellitus type 2, essential hypertension, CVA with right residual weakness, MGUS, BPH, HFr EF of 40%, He was discharged, recently from [facility] after admitted for acute metabolic encephalopathy, rhabdomyolysis and orthostatic, chronic pancytopenia presents to the ED from skilled nursing facility for shortness of breath, sacral pain, In the ED, he was found to be in RVR, tachypneic with saturations greater than 90%. Critical care was consulted for persistent hypotension. Respiratory panel was negative. General surgery was consulted for sacral wound debridement with bedside excision on 03/05/2024 and 03/07/2024. He is now hemodynamically stable for discharge.   6/4: Some concern of increasing discharge and some order from wound so wound care was reconsulted-repeat wound cultures were sent.  Daughter concerned of some shortness of breath which was not observed during rounds, chest x-ray with mild bibasilar subsegmental atelectasis or edema with small pleural effusions.  Remained on room air.  6/5:Hemodynamically stable.  Repeat cultures from wound yesterday with no organism.   Assessment and Plan:   Severe sepsis  - On presentation, lactic acidosis, leukocytosis, fever, secondary to infected chronic wound.  Blood cultures drawn, empiric IV fluid bolus, empiric antibiotics.  Sepsis now resolved.   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  Completed antibiotics 5/1.  General surgery now signed off.  Wound care following.  Infectious disease signed off.  Continue wound care management/regimen.  Offload pressure with every 2 hours turns, adequate nutrition.  Continues  showing improvement.  Will continue current regimen:   Cleanse with Vashe#[id number]. Apply Aquacel Ag+ (silver hydrofiber) Lawson # [phone] in the wound bed and fill the tunneling using a swab (change daily), cover with sacral foam dressing, change daily and PRN soilage - Repeat cultures were sent on 6/4-no organisms seen so far   Other pressure injuries -continue wound care, off loading, as ordered.  Going to be an ongoing struggle given pt's physical debilitation.     Acute on chronic pain - secondary to above -Changed to scheduled oxycontin  10mg  at bedtime, with oxycodone  2.5mg  q4hr prn.  Appears to be tolerating well.  Pt doing well with weaning so far.  Eventual goal to wean off opioids entirely by discharge.  Next step to wean bedtime oxycontin , possibly with scheduled oxy 5mg  at night.   Paroxysmal atrial fibrillation - Continue amiodarone  and Eliquis .  Cardiology signed off.  Continues on telemetry due to daughter's preference.   HFrEF/cardiac amyloidosis/NICM - On tafamadis for amyloid.  GDMT not in place secondary to hypotension.  Cardiology signed off for now.  Appears to be tolerating discontinuation of midodrine .  Will give another day to monitor BP.  If possible, will attempt to initiate GDMT slowly.     Insulin -dependent diabetes mellitus - Continues on glargine 7 units daily plus insulin  sliding scale.   Anemia of chronic disease - S/p 1 unit PRBC 4/30.  Hemoglobin stable, no active bleeding.   Dysphagia/severe protein calorie malnutrition - Nutrition/dietary following.  3 times daily protein calorie supplementation.  Vitamin supplementation.  Continues on pured diet.  Pt showing improvement in appetite.  Ideally would benefit from advancement of diet if possible per SLP.   Physical debilitation of muscle weakness - PT/OT on board.  Pt is motivated.  However,  limited ability to sit upright in chair secondary to pain from sacral ulcer.  Continue to encourage participation.   Pain appears to be limiting factor as well as overall muscle strength and weakness.   Goals of care - Patient is medically appropriate for discharge at this time.  Working closely with TOC family on disposition planning.  Patient's daughter wants to be absolutely sure wound is healing prior to transition out of the hospital.  However given his physical ability, STR or fulltime care would likely be required.   Discussion today 5/31 about goals and disposition.  Ideally her goal is to have his sacral wound nearly completely healed prior to discharge being a stage I or small stage II.  Given his rehospitalization secondary to sepsis from infected sacral ulcer, she is hesitant to transition to STR where care may be limited and recurrence of infection becomes more likely.  This is understandable and agreeable from my standpoint.  As his ulcer improves his pain will improve, as pain improves his PT will improve.  There is frank understanding that patient will likely not rebound to his previous independence however would like to optimize his health is much as possible prior to transition to PT rehab.  6/3 - Discussion with TOC about possibility of CIR as an option for rehab while still maintaining hospital care.  Unclear if pt is even a candidate or not, but would be worth exploring as an option given his improvement with his wound and pain.   Subjective: Patient was seen and examined today.  No new concern.  Physical Exam:  Vitals:   04/24/24 1653 04/24/24 1950 04/25/24 0421 04/25/24 0746  BP: 125/71 122/69 116/76 118/68  Pulse: 80 72 73 75  Resp: 19 17 18 17   Temp: 97.7 F (36.5 C) 97.7 F (36.5 C) 97.8 F (36.6 C) 98.3 F (36.8 C)  TempSrc:  Oral Oral   SpO2: 98% 100% 99% 100%  Weight:      Height:       General.  Frail and ill-appearing elderly man, in no acute distress. Pulmonary.  Lungs clear bilaterally, normal respiratory effort. CV.  Regular rate and rhythm, no JVD, rub or  murmur. Abdomen.  Soft, nontender, nondistended, BS positive. CNS.  Alert and oriented .  No focal neurologic deficit. Extremities.  No edema,  pulses intact and symmetrical. Multiple bandages involving different limbs and back   Data Reviewed: Prior data reviewed  Labs: CBC: Recent Labs  Lab 04/19/24 0852 04/24/24 0558  WBC 6.1 6.0  HGB 10.2* 10.3*  HCT 32.0* 32.2*  MCV 87.7 88.0  PLT 310 307   Basic Metabolic Panel: Recent Labs  Lab 04/19/24 0852 04/24/24 0558 04/24/24 2000  NA 139 141  --   K 4.0 3.6  --   CL 108 111  --   CO2 24 20*  --   GLUCOSE 117* 91  --   BUN 30* 30*  --   CREATININE 0.79 0.85  --   CALCIUM  8.4* 8.4*  --   MG 1.9 1.8 1.9  PHOS 3.9  --   --    Liver Function Tests: No results for input(s): "AST", "ALT", "ALKPHOS", "BILITOT", "PROT", "ALBUMIN " in the last 168 hours. CBG: Recent Labs  Lab 04/24/24 1306 04/24/24 1654 04/24/24 1950 04/25/24 0745 04/25/24 1140  GLUCAP 153* 132* 128* 94 221*    Scheduled Meds:  acetaminophen   500 mg Oral TID   amiodarone   200 mg Oral Daily   apixaban   5 mg Oral BID   atorvastatin   40 mg Oral Daily   Chlorhexidine  Gluconate Cloth  6 each Topical Daily   collagenase    Topical Daily   diclofenac  Sodium  4 g Topical QID   feeding supplement  237 mL Oral BID BM   ferrous sulfate   325 mg Oral QHS   furosemide   20 mg Oral Daily   guaiFENesin   600 mg Oral BID   insulin  aspart  0-15 Units Subcutaneous TID WC   insulin  aspart  0-5 Units Subcutaneous QHS   insulin  aspart  4 Units Subcutaneous TID WC   insulin  glargine-yfgn  7 Units Subcutaneous Daily   levothyroxine   50 mcg Oral Q0600   liver oil-zinc  oxide   Topical BID   multivitamin with minerals  1 tablet Oral Daily   nutrition supplement (JUVEN)  1 packet Oral BID BM   mouth rinse  15 mL Mouth Rinse 4 times per day   oxyCODONE   10 mg Oral QHS   phosphorus  500 mg Oral TID   polyethylene glycol  17 g Oral BID   senna-docusate  1 tablet Oral QHS    sodium chloride  flush  3 mL Intravenous Q12H   Tafamidis   1 capsule Oral Daily   Continuous Infusions: PRN Meds:.acetaminophen  **OR** acetaminophen , food thickener, lip balm, ondansetron  **OR** ondansetron  (ZOFRAN ) IV, mouth rinse, oxyCODONE , polyethylene glycol, artificial tears, sodium chloride  flush  Family Communication: Talked with daughter on phone  Disposition: Status is: Inpatient Remains inpatient appropriate because: disposition  DVT prophylaxis.  Eliquis  Time spent: 45 minutes  Length of inpatient stay: 52 days  Author: Luna Salinas, MD 04/25/2024 12:44 PM  For on call review [email or web address].

## 2024-04-26 DIAGNOSIS — R6520 Severe sepsis without septic shock: Secondary | ICD-10-CM | POA: Diagnosis not present

## 2024-04-26 DIAGNOSIS — A419 Sepsis, unspecified organism: Secondary | ICD-10-CM | POA: Diagnosis not present

## 2024-04-26 LAB — GLUCOSE, CAPILLARY
Glucose-Capillary: 104 mg/dL — ABNORMAL HIGH (ref 70–99)
Glucose-Capillary: 222 mg/dL — ABNORMAL HIGH (ref 70–99)
Glucose-Capillary: 128 mg/dL — ABNORMAL HIGH (ref 70–99)
Glucose-Capillary: 36 mg/dL — CL (ref 70–99)
Glucose-Capillary: 47 mg/dL — ABNORMAL LOW (ref 70–99)
Glucose-Capillary: 99 mg/dL (ref 70–99)

## 2024-04-26 NOTE — Progress Notes (Signed)
 Physical Therapy Treatment Patient Details Name: Earl [name] MRN: [id number] DOB: 03/06/1941 Today's Date: 04/26/2024   History of Present Illness 83 y.o. male admitted from [facility] rehab on 03/04/24 with SOB, sacral pain, Afib with RVR. Pt with acute metabolic encephalopathy, rhabdomyolysis and sepsis secondary to sacral wound infection. 4/15 & 4/17 bedside sacral wound debridement. PMhx: adm 3/26-4/08/2024 with fall, AMS, COVID; d/c to SNF rehab. CVA (2021, residual R-side weakness), T2DM, HTN, CHF, MGUS, Afib on Eliquis .    PT Comments  Pt received in supine, anxious due to LLQ discomfort and refusing EOB/transfer training, but agreeable to bed-level exercises and repositioning with heavy encouragement. Once initiated, pt with good effort and tolerance for supine BLE and BUE exercises including bed crunches, BLE 4-way hip ROM, and resisted BUE exercises. Pt SpO2 93-97% on RA, with improvement after repositioning and HOB elevated and with cues for pursed-lip breathing and deeper breaths as pt tends to breathe more shallowly when anxious. Play to trial hoyer OOB (vs lateral scoot) next session to build tolerance for chair posture. Patient will benefit from continued inpatient follow up therapy, <3 hours/day.     If plan is discharge home, recommend the following: Two people to help with walking and/or transfers;Two people to help with bathing/dressing/bathroom;Assistance with feeding;Assist for transportation   Can travel by private vehicle     No  Equipment Recommendations  Hospital bed;Hoyer lift    Recommendations for Other Services       Precautions / Restrictions Precautions Precautions: Fall;Other (comment) Recall of Precautions/Restrictions: Intact Precaution/Restrictions Comments: significant pain from sacral wound; b/b incontinence Restrictions Weight Bearing Restrictions Per Provider Order: No     Mobility  Bed Mobility Overal bed mobility: Needs Assistance              General bed mobility comments: TotalA +2 for posterior supine scooting toward HOB to reposition better in air bed; mod/maxA +1 for pulling from reclined bed posture to long sitting in air bed using side rail and HHA x5 reps    Transfers Overall transfer level: Needs assistance                 General transfer comment: pt adamantly refusing due to LLQ pain and anxiety regarding this    Ambulation/Gait                   Stairs             Wheelchair Mobility     Tilt Bed    Modified Rankin (Stroke Patients Only)       Balance Overall balance assessment: Needs assistance Sitting-balance support: Bilateral upper extremity supported, Feet supported Sitting balance-Leahy Scale: Poor Sitting balance - Comments: long sitting in air bed with R rail and HHA, +1 mod/maxA for upright long sitting (legs elevated on pillows are impacting seated balance) Postural control: Posterior lean                                  Communication Communication Communication: Impaired Factors Affecting Communication: Reduced clarity of speech (appears to be his baseline)  Cognition Arousal: Alert Behavior During Therapy: Anxious   PT - Cognitive impairments: Problem solving, Sequencing, Memory, Safety/Judgement                       PT - Cognition Comments: Pt perseverating on LLQ pain and anxiety regarding concern for bladder issue initially  but with discussion on benefits of mobility/exercise, pt agreeable to bed-level session. Following commands: Impaired Following commands impaired: Only follows one step commands consistently    Cueing Cueing Techniques: Verbal cues, Tactile cues, Gestural cues  Exercises General Exercises - Lower Extremity Quad Sets: AROM, Both, 5 reps, Supine Short Arc Quad: AROM, Both, Supine, 10 reps (bolster) Heel Slides: AROM, Both, Supine, 15 reps (cues for increased speed/intensity and visual target for  ROM) Hip ABduction/ADduction: AROM, Both, 10 reps, Supine (very light resistance on LLE as tolerated) Straight Leg Raises: AAROM, Both, 10 reps, Supine Other Exercises Other Exercises: bed "crunches" pulling with BUE on side rails/HHA x5 reps with cues for breathing and core activation Other Exercises: long sitting x10-30 seconds wtih cues for core/UE activation to maintain and external assist +1 Other Exercises: supine BUE AROM: chest press and bicep curls x5 reps ea with gentle graded resistance (via HHA) within his tolerance    General Comments General comments (skin integrity, edema, etc.): SCD in place during session and prevalon boots donned at end of session      Pertinent Vitals/Pain Pain Assessment Pain Assessment: Faces Faces Pain Scale: Hurts little more Pain Location: LLQ, RN planning to bladder scan him Pain Descriptors / Indicators: Aching, Discomfort, Grimacing Pain Intervention(s): Monitored during session, Repositioned, Limited activity within patient's tolerance    Home Living                          Prior Function            PT Goals (current goals can now be found in the care plan section) Acute Rehab PT Goals Patient Stated Goal: To improve strength and mobility to reduce risk for falls. PT Goal Formulation: With patient Time For Goal Achievement: 05/10/24 Potential to Achieve Goals: Fair Progress towards PT goals: Progressing toward goals    Frequency    Min 1X/week      PT Plan      Co-evaluation              AM-PAC PT "6 Clicks" Mobility   Outcome Measure  Help needed turning from your back to your side while in a flat bed without using bedrails?: A Lot Help needed moving from lying on your back to sitting on the side of a flat bed without using bedrails?: Total (+2 assist) Help needed moving to and from a bed to a chair (including a wheelchair)?: Total Help needed standing up from a chair using your arms (e.g., wheelchair  or bedside chair)?: Total Help needed to walk in hospital room?: Total Help needed climbing 3-5 steps with a railing? : Total 6 Click Score: 7    End of Session   Activity Tolerance: Patient tolerated treatment well;Other (comment) (anxiety about LLQ limiting his willingness to change postures; agreeable only to bed-level session) Patient left: in bed;with call bell/phone within reach;with bed alarm set;with SCD's reapplied Nurse Communication: Mobility status;Need for lift equipment;Other (comment) (LLQ pain (RN already aware)) PT Visit Diagnosis: Muscle weakness (generalized) (M62.81);Adult, failure to thrive (R62.7);Difficulty in walking, not elsewhere classified (R26.2);Pain;Other abnormalities of gait and mobility (R26.89) Pain - Right/Left: Left Pain - part of body:  (abdomen/possibly bladder (pt pointing just above L hip))     Time: 1610-9604 PT Time Calculation (min) (ACUTE ONLY): 22 min  Charges:    $Therapeutic Exercise: 8-22 mins PT General Charges $$ ACUTE PT VISIT: 1 Visit  Earl Rile., PTA Acute Rehabilitation Services Secure Chat Preferred 9a-5:30pm Office: [phone]    Arville Laughter 04/26/2024, 1:59 PM

## 2024-04-26 NOTE — Progress Notes (Signed)
 Progress Note   Patient: Earl [name] ZOX:[id number] DOB: 02/20/1941 DOA: 03/04/2024     53 DOS: the patient was seen and examined on 04/26/2024   Brief hospital course: 83 yrs old male with past medical history of paroxysmal atrial fibrillation on Eliquis , insulin -dependent diabetes mellitus type 2, essential hypertension, CVA with right residual weakness, MGUS, BPH, HFr EF of 40%, He was discharged, recently from [facility] after admitted for acute metabolic encephalopathy, rhabdomyolysis and orthostatic, chronic pancytopenia presents to the ED from skilled nursing facility for shortness of breath, sacral pain, In the ED, he was found to be in RVR, tachypneic with saturations greater than 90%. Critical care was consulted for persistent hypotension. Respiratory panel was negative. General surgery was consulted for sacral wound debridement with bedside excision on 03/05/2024 and 03/07/2024. He is now hemodynamically stable for discharge.    6/4: Some concern of increasing discharge and some order from wound so wound care was reconsulted-repeat wound cultures were sent.  Daughter concerned of some shortness of breath which was not observed during rounds, chest x-ray with mild bibasilar subsegmental atelectasis or edema with small pleural effusions.  Remained on room air.   6/5:Hemodynamically stable.  Repeat cultures from wound yesterday with no organism.  6/6 - he is lying in bed, weak, does not wish to go to any facility.   Assessment and Plan: Severe sepsis  - On presentation, lactic acidosis, leukocytosis, fever, secondary to infected chronic wound.  Blood cultures drawn, empiric IV fluid bolus, empiric antibiotics.  Sepsis now resolved.   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  Completed antibiotics 5/1.  General surgery now signed off.  Wound care following.  Infectious disease signed off.  Continue wound care management/regimen.   Offload pressure with every 2 hours turns, adequate nutrition.  Continues showing improvement.  Will continue current regimen:   Cleanse with Vashe#[id number]. Apply Aquacel Ag+ (silver hydrofiber) Lawson # [phone] in the wound bed and fill the tunneling using a swab (change daily), cover with sacral foam dressing, change daily and PRN soilage - Repeat cultures were sent on 6/4-no organisms seen so far   Other pressure injuries -continue wound care, off loading, as ordered.  Going to be an ongoing struggle given pt's physical debilitation.     Acute on chronic pain - secondary to above -Changed to scheduled oxycontin  10mg  at bedtime, with oxycodone  2.5mg  q4hr prn.  Appears to be tolerating well.  Pt doing well with weaning so far.  Eventual goal to wean off opioids entirely by discharge.  Next step to wean bedtime oxycontin , possibly with scheduled oxy 5mg  at night.   Paroxysmal atrial fibrillation - Continue amiodarone  and Eliquis .  Cardiology signed off.  Continues on telemetry due to daughter's preference.   HFrEF/cardiac amyloidosis/NICM - On tafamadis for amyloid.  GDMT not in place secondary to hypotension.  Cardiology signed off for now.  Appears to be tolerating discontinuation of midodrine .  Will give another day to monitor BP.  If possible, will attempt to initiate GDMT slowly.     Insulin -dependent diabetes mellitus - Continues on glargine 7 units daily plus insulin  sliding scale.   Anemia of chronic disease - S/p 1 unit PRBC 4/30.  Hemoglobin stable, no active bleeding.   Dysphagia/severe protein calorie malnutrition - Nutrition/dietary following.  3 times daily protein calorie supplementation.  Vitamin supplementation.  Continues on pured diet.  Pt showing improvement in appetite.  Ideally would benefit from advancement of diet if  possible per SLP.   Physical debilitation of muscle weakness - PT/OT on board.  Pt is motivated.  However, limited ability to sit upright in chair  secondary to pain from sacral ulcer.  Continue to encourage participation.  Pain appears to be limiting factor as well as overall muscle strength and weakness.   Goals of care - Patient is medically appropriate for discharge at this time.  Working closely with TOC family on disposition planning.  Patient's daughter wants to be absolutely sure wound is healing prior to transition out of the hospital.  However given his physical ability, STR or fulltime care would likely be required.        Out of bed to chair. Incentive spirometry. Nursing supportive care. Fall, aspiration precautions. Diet:  Diet Orders (From admission, onward)     Start     Ordered   03/17/24 1054  DIET - DYS 1 Room service appropriate? Yes; Fluid consistency: Thin  Diet effective now       Question Answer Comment  Room service appropriate? Yes   Fluid consistency: Thin      03/17/24 1053           DVT prophylaxis: SCDs Start: 03/04/24 2151 Place TED hose Start: 03/04/24 2151 apixaban  (ELIQUIS ) tablet 5 mg  Level of care: Telemetry Medical   Code Status: Limited: Do not attempt resuscitation (DNR) -DNR-LIMITED -Do Not Intubate/DNI   Subjective: Patient is seen and examined today morning. He is lying in bed, RN at bedside cleaning and repositioning him. Refuses placement.    Physical Exam: Vitals:   04/26/24 0521 04/26/24 0746 04/26/24 1216 04/26/24 1300  BP: 119/77 123/74 (!) 127/99   Pulse: 79 76 79   Resp: 18 18 19    Temp: 98.3 F (36.8 C) (!) 97.5 F (36.4 C) (!) 97.4 F (36.3 C)   TempSrc: Oral     SpO2: 99% 100% 100% 95%  Weight:      Height:        General - Elderly ill African American male, no apparent distress HEENT - PERRLA, EOMI, atraumatic head, non tender sinuses. Lung - Clear, basal rales, rhonchi, wheezes. Heart - S1, S2 heard, no murmurs, rubs, trace pedal edema. Abdomen - Soft, non tender, bowel sounds good Neuro - Alert, awake and oriented, non focal exam. Skin - Warm and  dry.  Data Reviewed:      Latest Ref Rng & Units 04/24/2024    5:58 AM 04/19/2024    8:52 AM 04/18/2024    7:28 AM  CBC  WBC 4.0 - 10.5 K/uL 6.0  6.1  5.8   Hemoglobin 13.0 - 17.0 g/dL 09.8  11.9  14.7   Hematocrit 39.0 - 52.0 % 32.2  32.0  31.1   Platelets 150 - 400 K/uL 307  310  302       Latest Ref Rng & Units 04/24/2024    5:58 AM 04/19/2024    8:52 AM 04/18/2024    7:28 AM  BMP  Glucose 70 - 99 mg/dL 91  829  562   BUN 8 - 23 mg/dL 30  30  31    Creatinine 0.61 - 1.24 mg/dL 1.30  8.65  7.84   Sodium 135 - 145 mmol/L 141  139  139   Potassium 3.5 - 5.1 mmol/L 3.6  4.0  4.1   Chloride 98 - 111 mmol/L 111  108  105   CO2 22 - 32 mmol/L 20  24  26    Calcium   8.9 - 10.3 mg/dL 8.4  8.4  8.4    No results found.  Family Communication: Discussed with patient, understand and agree. All questions answered.  Disposition: Status is: Inpatient Remains inpatient appropriate because: family discussion, placement  Planned Discharge Destination: Rehab     Time spent: 42 minutes  Author: Aisha Hove, MD 04/26/2024 3:48 PM Secure chat 7am to 7pm For on call review [email or web address].

## 2024-04-27 DIAGNOSIS — I4891 Unspecified atrial fibrillation: Secondary | ICD-10-CM | POA: Diagnosis not present

## 2024-04-27 DIAGNOSIS — A419 Sepsis, unspecified organism: Secondary | ICD-10-CM | POA: Diagnosis not present

## 2024-04-27 DIAGNOSIS — L98429 Non-pressure chronic ulcer of back with unspecified severity: Secondary | ICD-10-CM | POA: Diagnosis not present

## 2024-04-27 DIAGNOSIS — E854 Organ-limited amyloidosis: Secondary | ICD-10-CM | POA: Diagnosis not present

## 2024-04-27 LAB — GLUCOSE, CAPILLARY
Glucose-Capillary: 76 mg/dL (ref 70–99)
Glucose-Capillary: 161 mg/dL — ABNORMAL HIGH (ref 70–99)
Glucose-Capillary: 160 mg/dL — ABNORMAL HIGH (ref 70–99)
Glucose-Capillary: 153 mg/dL — ABNORMAL HIGH (ref 70–99)
Glucose-Capillary: 61 mg/dL — ABNORMAL LOW (ref 70–99)
Glucose-Capillary: 108 mg/dL — ABNORMAL HIGH (ref 70–99)

## 2024-04-27 LAB — AEROBIC CULTURE W GRAM STAIN (SUPERFICIAL SPECIMEN): Gram Stain: NONE SEEN

## 2024-04-27 MED ORDER — SODIUM CHLORIDE 0.9 % IV SOLN
2.0000 g | Freq: Three times a day (TID) | INTRAVENOUS | Status: AC
  Administered 2024-04-27 – 2024-05-02 (×17): 2 g via INTRAVENOUS
  Filled 2024-04-27 (×20): qty 2

## 2024-04-27 NOTE — Progress Notes (Signed)
 Progress Note   Patient: Earl [name] ZOX:[id number] DOB: July 23, 1941 DOA: 03/04/2024     54 DOS: the patient was seen and examined on 04/27/2024   Brief hospital course: 83 yrs old male with past medical history of paroxysmal atrial fibrillation on Eliquis , insulin -dependent diabetes mellitus type 2, essential hypertension, CVA with right residual weakness, MGUS, BPH, HFr EF of 40%, He was discharged, recently from [facility] after admitted for acute metabolic encephalopathy, rhabdomyolysis and orthostatic, chronic pancytopenia presents to the ED from skilled nursing facility for shortness of breath, sacral pain, In the ED, he was found to be in RVR, tachypneic with saturations greater than 90%. Critical care was consulted for persistent hypotension. Respiratory panel was negative. General surgery was consulted for sacral wound debridement with bedside excision on 03/05/2024 and 03/07/2024. He is now hemodynamically stable for discharge.    6/4: Some concern of increasing discharge and some order from wound so wound care was reconsulted-repeat wound cultures were sent.  Daughter concerned of some shortness of breath which was not observed during rounds, chest x-ray with mild bibasilar subsegmental atelectasis or edema with small pleural effusions.  Remained on room air.   6/5:Hemodynamically stable.  Repeat cultures from wound yesterday with no organism.  6/6 - he is lying in bed, weak, does not wish to go to any facility.   6/7: Wound cultures 6/4 back with pseudomonas, klebsiella.   Assessment and Plan: Severe sepsis  On presentation, lactic acidosis, leukocytosis, fever, secondary to infected chronic wound.  Blood cultures drawn, empiric IV fluid bolus, empiric antibiotics.  Sepsis now resolved.   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  Completed antibiotics 5/1.  General surgery now signed off.  Wound care following.  Infectious  disease signed off.  Continue wound care management/regimen.  Offload pressure with every 2 hours turns, adequate nutrition.  Continues showing improvement.  Will continue current regimen:   Cleanse with Vashe#[id number]. Apply Aquacel Ag+ (silver hydrofiber) Lawson # [phone] in the wound bed and fill the tunneling using a swab (change daily), cover with sacral foam dressing, change daily and PRN soilage Repeat cultures were sent on 6/4 grew klebsiella, pseudomonas. Sensitivities reviewed. Will start IV ceftazidime while inpatient.   Other pressure injuries Continue wound care, off loading, as ordered.   Challenging as he is bed bound. Air mattress, frequent positioning for off loading.   Acute on chronic pain - secondary to above Continue OxyContin  10mg  at bedtime, with oxycodone  2.5mg  q4hr prn.  Wean off opioids entirely by discharge.  Next step to wean bedtime oxycontin , possibly with scheduled oxy 5mg  at night.   Paroxysmal atrial fibrillation Continue amiodarone  and Eliquis .  Cardiology signed off.  Continues on telemetry due to daughter's preference.   HFrEF/cardiac amyloidosis/NICM On tafamadis for amyloid.  GDMT not in place secondary to hypotension.  Cardiology signed off for now.  Appears to be tolerating discontinuation of midodrine .  Will give another day to monitor BP.  If possible, will attempt to initiate GDMT slowly.     Insulin -dependent diabetes mellitus Continues on glargine 7 units daily plus insulin  sliding scale. Stopped premeal as his blood sugars lower side.   Anemia of chronic disease S/p 1 unit PRBC 4/30.  Hemoglobin stable, no active bleeding.   Dysphagia/severe protein calorie malnutrition Nutrition/dietary following. Continue 3 times daily protein calorie supplementation.  Vitamin supplementation.  Continues on pured diet.  Pt showing improvement in appetite.     Physical debilitation of muscle weakness  PT/OT on board.  However, limited ability to sit upright in  chair secondary to pain from sacral ulcer.   Pain appears to be limiting factor as well as overall muscle strength and weakness.   Goals of care Patient is medically appropriate for discharge at this time.  Working closely with TOC family on disposition planning. Patient refuses to go to any facility and wishes to go home.  Discussed with daughter- she wants to be absolutely sure wound is healing prior to transition out of the hospital.  However given his debility, STR or fulltime care would likely be required.      Out of bed to chair. Incentive spirometry. Nursing supportive care. Fall, aspiration precautions. Diet:  Diet Orders (From admission, onward)     Start     Ordered   03/17/24 1054  DIET - DYS 1 Room service appropriate? Yes; Fluid consistency: Thin  Diet effective now       Question Answer Comment  Room service appropriate? Yes   Fluid consistency: Thin      03/17/24 1053           DVT prophylaxis: SCDs Start: 03/04/24 2151 Place TED hose Start: 03/04/24 2151 apixaban  (ELIQUIS ) tablet 5 mg  Level of care: Telemetry Medical   Code Status: Limited: Do not attempt resuscitation (DNR) -DNR-LIMITED -Do Not Intubate/DNI   Subjective: Patient is seen and examined today morning. He is lying in bed, RN at bedside giving his medications. Asks me to call his daughter.  Physical Exam: Vitals:   04/26/24 1919 04/26/24 2333 04/27/24 0353 04/27/24 1229  BP: 112/79 110/67 120/60 125/79  Pulse: 82 70 75 84  Resp: 17 17 17 19   Temp: (!) 97.5 F (36.4 C) 97.7 F (36.5 C) 97.9 F (36.6 C) 97.6 F (36.4 C)  TempSrc:      SpO2: 100% 100% 100% 98%  Weight:      Height:        General - Elderly ill African American male, no apparent distress HEENT - PERRLA, EOMI, atraumatic head, non tender sinuses. Lung - Clear, basal rales, rhonchi, wheezes. Heart - S1, S2 heard, no murmurs, rubs, trace pedal edema. Abdomen - Soft, non tender, bowel sounds good Neuro - Alert, awake  and oriented, non focal exam. Skin - Warm and dry.  Data Reviewed:      Latest Ref Rng & Units 04/24/2024    5:58 AM 04/19/2024    8:52 AM 04/18/2024    7:28 AM  CBC  WBC 4.0 - 10.5 K/uL 6.0  6.1  5.8   Hemoglobin 13.0 - 17.0 g/dL 32.9  51.8  84.1   Hematocrit 39.0 - 52.0 % 32.2  32.0  31.1   Platelets 150 - 400 K/uL 307  310  302       Latest Ref Rng & Units 04/24/2024    5:58 AM 04/19/2024    8:52 AM 04/18/2024    7:28 AM  BMP  Glucose 70 - 99 mg/dL 91  660  630   BUN 8 - 23 mg/dL 30  30  31    Creatinine 0.61 - 1.24 mg/dL 1.60  1.09  3.23   Sodium 135 - 145 mmol/L 141  139  139   Potassium 3.5 - 5.1 mmol/L 3.6  4.0  4.1   Chloride 98 - 111 mmol/L 111  108  105   CO2 22 - 32 mmol/L 20  24  26    Calcium  8.9 - 10.3 mg/dL 8.4  8.4  8.4    No results found.  Family Communication: Discussed with patient, daughter over phone. They understand and agree. All questions answered.  Disposition: Status is: Inpatient Remains inpatient appropriate because: family discussion, placement  Planned Discharge Destination: Rehab     Time spent: 39 minutes  Author: Aisha Hove, MD 04/27/2024 3:13 PM Secure chat 7am to 7pm For on call review [email or web address].

## 2024-04-27 NOTE — Plan of Care (Signed)
  Problem: Fluid Volume: Goal: Hemodynamic stability will improve Outcome: Progressing   Problem: Clinical Measurements: Goal: Diagnostic test results will improve Outcome: Progressing Goal: Signs and symptoms of infection will decrease Outcome: Progressing   Problem: Respiratory: Goal: Ability to maintain adequate ventilation will improve Outcome: Progressing   Problem: Education: Goal: Knowledge of risk factors and measures for prevention of condition will improve Outcome: Progressing   Problem: Coping: Goal: Psychosocial and spiritual needs will be supported Outcome: Progressing   Problem: Respiratory: Goal: Will maintain a patent airway Outcome: Progressing Goal: Complications related to the disease process, condition or treatment will be avoided or minimized Outcome: Progressing   Problem: Education: Goal: Ability to describe self-care measures that may prevent or decrease complications (Diabetes Survival Skills Education) will improve Outcome: Progressing   Problem: Coping: Goal: Ability to adjust to condition or change in health will improve Outcome: Progressing   Problem: Fluid Volume: Goal: Ability to maintain a balanced intake and output will improve Outcome: Progressing   Problem: Health Behavior/Discharge Planning: Goal: Ability to manage health-related needs will improve Outcome: Progressing   Problem: Metabolic: Goal: Ability to maintain appropriate glucose levels will improve Outcome: Progressing   Problem: Nutritional: Goal: Maintenance of adequate nutrition will improve Outcome: Progressing Goal: Progress toward achieving an optimal weight will improve Outcome: Progressing   Problem: Skin Integrity: Goal: Risk for impaired skin integrity will decrease Outcome: Progressing   Problem: Tissue Perfusion: Goal: Adequacy of tissue perfusion will improve Outcome: Progressing   Problem: Education: Goal: Knowledge of General Education information  will improve Description: Including pain rating scale, medication(s)/side effects and non-pharmacologic comfort measures Outcome: Progressing   Problem: Health Behavior/Discharge Planning: Goal: Ability to manage health-related needs will improve Outcome: Progressing   Problem: Clinical Measurements: Goal: Ability to maintain clinical measurements within normal limits will improve Outcome: Progressing Goal: Will remain free from infection Outcome: Progressing Goal: Diagnostic test results will improve Outcome: Progressing Goal: Respiratory complications will improve Outcome: Progressing Goal: Cardiovascular complication will be avoided Outcome: Progressing   Problem: Activity: Goal: Risk for activity intolerance will decrease Outcome: Progressing   Problem: Nutrition: Goal: Adequate nutrition will be maintained Outcome: Progressing   Problem: Coping: Goal: Level of anxiety will decrease Outcome: Progressing   Problem: Elimination: Goal: Will not experience complications related to bowel motility Outcome: Progressing Goal: Will not experience complications related to urinary retention Outcome: Progressing   Problem: Pain Managment: Goal: General experience of comfort will improve and/or be controlled Outcome: Progressing   Problem: Safety: Goal: Ability to remain free from injury will improve Outcome: Progressing   Problem: Skin Integrity: Goal: Risk for impaired skin integrity will decrease Outcome: Progressing

## 2024-04-27 NOTE — Progress Notes (Signed)
 Received call from tele: Pt has wide QRS run, "slow"  V-Tach.

## 2024-04-28 DIAGNOSIS — I4891 Unspecified atrial fibrillation: Secondary | ICD-10-CM | POA: Diagnosis not present

## 2024-04-28 DIAGNOSIS — A419 Sepsis, unspecified organism: Secondary | ICD-10-CM | POA: Diagnosis not present

## 2024-04-28 DIAGNOSIS — E854 Organ-limited amyloidosis: Secondary | ICD-10-CM | POA: Diagnosis not present

## 2024-04-28 DIAGNOSIS — L98429 Non-pressure chronic ulcer of back with unspecified severity: Secondary | ICD-10-CM | POA: Diagnosis not present

## 2024-04-28 LAB — GLUCOSE, CAPILLARY
Glucose-Capillary: 106 mg/dL — ABNORMAL HIGH (ref 70–99)
Glucose-Capillary: 183 mg/dL — ABNORMAL HIGH (ref 70–99)
Glucose-Capillary: 190 mg/dL — ABNORMAL HIGH (ref 70–99)
Glucose-Capillary: 233 mg/dL — ABNORMAL HIGH (ref 70–99)
Glucose-Capillary: 161 mg/dL — ABNORMAL HIGH (ref 70–99)

## 2024-04-28 LAB — BASIC METABOLIC PANEL WITH GFR
Anion gap: 12 (ref 5–15)
BUN: 29 mg/dL — ABNORMAL HIGH (ref 8–23)
CO2: 23 mmol/L (ref 22–32)
Calcium: 8.2 mg/dL — ABNORMAL LOW (ref 8.9–10.3)
Chloride: 106 mmol/L (ref 98–111)
Creatinine, Ser: 1.01 mg/dL (ref 0.61–1.24)
GFR, Estimated: >60 mL/min (ref >60)
Glucose, Bld: 106 mg/dL — ABNORMAL HIGH (ref 70–99)
Potassium: 3.5 mmol/L (ref 3.5–5.1)
Sodium: 141 mmol/L (ref 135–145)

## 2024-04-28 LAB — CBC
HCT: 33.2 % — ABNORMAL LOW (ref 39.0–52.0)
Hemoglobin: 10.7 g/dL — ABNORMAL LOW (ref 13.0–17.0)
MCH: 28.4 pg (ref 26.0–34.0)
MCHC: 32.2 g/dL (ref 30.0–36.0)
MCV: 88.1 fL (ref 80.0–100.0)
Platelets: 292 10*3/uL (ref 150–400)
RBC: 3.77 MIL/uL — ABNORMAL LOW (ref 4.22–5.81)
RDW: 17 % — ABNORMAL HIGH (ref 11.5–15.5)
WBC: 5.2 10*3/uL (ref 4.0–10.5)
nRBC: 0 % (ref 0.0–0.2)

## 2024-04-28 NOTE — Progress Notes (Signed)
 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 Critical care was consulted for persistent hypotension. Respiratory panel was negative. General surgery was consulted for sacral wound debridement with bedside excision on 03/05/2024 and 03/07/2024. He is now hemodynamically stable for discharge.    6/4: Some concern of increasing discharge and some order from wound so wound care was reconsulted-repeat wound cultures were sent.  Daughter concerned of some shortness of breath which was not observed during rounds, chest x-ray with mild bibasilar subsegmental atelectasis or edema with small pleural effusions.  Remained on room air.   6/5:Hemodynamically stable.  Repeat cultures from wound yesterday with no organism.  6/6 - he is lying in bed, weak, does not wish to go to any facility.   6/7: Wound cultures 6/4 back with pseudomonas, klebsiella.   6/8: daughter at bedside, wishes he can be taken out of room for his birthday.   Assessment and Plan: Severe sepsis  On presentation, lactic acidosis, leukocytosis, fever, secondary to infected chronic wound.  Blood cultures drawn, empiric IV fluid bolus, empiric antibiotics.  Sepsis now resolved.   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  Completed  antibiotics 5/1.  General surgery now signed off.  Wound care following.  Infectious disease signed off.  Continue wound care management/regimen.  Offload pressure with every 2 hours turns, adequate nutrition.  Continues showing improvement.  Will continue current regimen:   Cleanse with Vashe#[id number]. Apply Aquacel Ag+ (silver hydrofiber) Lawson # [id number] in the wound bed and fill the tunneling using a swab (change daily), cover with sacral foam dressing, change daily and PRN soilage Repeat cultures were sent on 6/4 grew klebsiella, pseudomonas. Sensitivities reviewed. continue IV ceftazidime while inpatient as per pharmacy protocol.   Other pressure injuries Continue wound care, off loading, as ordered.   Challenging as he is bed bound. Air mattress, frequent positioning for off loading.   Acute on chronic pain - secondary to above Continue OxyContin  10mg  at bedtime, with oxycodone  2.5mg  q4hr prn.  Wean off opioids entirely by discharge.  Next step to wean bedtime oxycontin , possibly with scheduled oxy 5mg  at night.   Paroxysmal atrial fibrillation Continue amiodarone  and Eliquis .  Cardiology signed off.  Continues on telemetry due to daughter's preference.   HFrEF/cardiac amyloidosis/NICM On tafamadis for amyloid.  GDMT not in place secondary to hypotension.  Cardiology signed off for now.  Appears to be tolerating discontinuation of midodrine .  Will give another day to monitor BP.  If possible, will attempt to initiate GDMT slowly.     Insulin -dependent diabetes mellitus Continues on glargine 7 units daily plus insulin  sliding scale. Stopped premeal as his blood sugars lower side.   Anemia of chronic disease S/p 1 unit PRBC 4/30.  Hemoglobin stable, no active bleeding.   Dysphagia/severe protein calorie malnutrition Nutrition/dietary following. Continue 3 times daily protein calorie supplementation.  Vitamin supplementation.  Continues on pured diet.  Pt showing improvement in appetite.      Physical debilitation of muscle weakness PT/OT on board.  However, limited ability to sit upright in chair secondary to pain from sacral ulcer.   Pain appears to be limiting factor as well as overall muscle strength and weakness.   Goals of care Patient is medically appropriate for discharge at this time.  Working closely with TOC family on disposition planning. Patient refuses to go to any facility and wishes to go home.  Daughter wants to be absolutely sure wound is healing prior to transition out of the hospital.  However given his debility, STR or fulltime care would likely be required.      Out of bed to chair. Incentive spirometry. Nursing supportive care. Fall, aspiration precautions. Diet:  Diet Orders (From admission, onward)     Start     Ordered   03/17/24 1054  DIET - DYS 1 Room service appropriate? Yes; Fluid consistency: Thin  Diet effective now       Question Answer Comment  Room service appropriate? Yes   Fluid consistency: Thin      03/17/24 1053           DVT prophylaxis: SCDs Start: 03/04/24 2151 Place TED hose Start: 03/04/24 2151 apixaban  (ELIQUIS ) tablet 5 mg  Level of care: Telemetry Medical   Code Status: Limited: Do not attempt resuscitation (DNR) -DNR-LIMITED -Do Not Intubate/DNI   Subjective: Patient is seen and examined today morning. He is sleepy but arousable. Family at bedside willing to take him out for his birthday.   Physical Exam: Vitals:   04/28/24 0036 04/28/24 0353 04/28/24 0907 04/28/24 1143  BP: 117/74 130/67 128/67 128/73  Pulse: 77 75 74 82  Resp: 18 19 18 18   Temp: (!) 97.5 F (36.4 C) (!) 97.4 F (36.3 C) (!) 97.3 F (36.3 C) 97.7 F (36.5 C)  TempSrc: Oral Oral    SpO2: 100% 100% 100% 99%  Weight:      Height:        General - Elderly ill African American male, no apparent distress HEENT - PERRLA, EOMI, atraumatic head, non tender sinuses. Lung - Clear, basal rales, rhonchi, wheezes. Heart - S1, S2 heard, no  murmurs, rubs, trace pedal edema. Abdomen - Soft, non tender, bowel sounds good Neuro - Alert, awake and oriented, non focal exam. Skin - Warm and dry.  Data Reviewed:      Latest Ref Rng & Units 04/28/2024    8:21 AM 04/24/2024    5:58 AM 04/19/2024    8:52 AM  CBC  WBC 4.0 - 10.5 K/uL 5.2  6.0  6.1   Hemoglobin 13.0 - 17.0 g/dL 09.8  11.9  14.7   Hematocrit 39.0 - 52.0 % 33.2  32.2  32.0   Platelets 150 - 400 K/uL 292  307  310       Latest Ref Rng & Units 04/28/2024    8:21 AM 04/24/2024    5:58 AM 04/19/2024    8:52 AM  BMP  Glucose 70 - 99 mg/dL 829  91  562   BUN 8 - 23 mg/dL 29  30  30    Creatinine 0.61 - 1.24 mg/dL 1.30  8.65  7.84   Sodium 135 - 145 mmol/L 141  141  139   Potassium 3.5 - 5.1 mmol/L 3.5  3.6  4.0   Chloride 98 - 111 mmol/L 106  111  108   CO2 22 -  32 mmol/L 23  20  24    Calcium  8.9 - 10.3 mg/dL 8.2  8.4  8.4    No results found.  Family Communication: Discussed with patient, daughter at bedside. They understand and agree. All questions answered.  Disposition: Status is: Inpatient Remains inpatient appropriate because: family discussion, placement  Planned Discharge Destination: Rehab     Time spent: 40 minutes  Author: Aisha Hove, MD 04/28/2024 1:22 PM Secure chat 7am to 7pm For on call review [email or web address].

## 2024-04-28 NOTE — Progress Notes (Signed)
 Mobility Specialist Progress Note;   04/28/24 1030  Mobility  Activity Transferred from bed to chair  Level of Assistance +2 (takes two people)  Assistive Device MaxiMove  Activity Response Tolerated well  Mobility Referral Yes  Mobility visit 1 Mobility  Mobility Specialist Start Time (ACUTE ONLY) 1030  Mobility Specialist Stop Time (ACUTE ONLY) 1056  Mobility Specialist Time Calculation (min) (ACUTE ONLY) 26 min   Family requesting to take pt outside for his birthday w/ orders allowing. Assisted nursing team in transferring pt from bed to chair safely via [facility]. Required MaxA-total to roll pt for lift pads. Pt safely transferred to chair. Family and NT rolling pt off unit with all needs met.   Janit Meline Mobility Specialist Please contact via SecureChat or [employer] [phone]

## 2024-04-28 NOTE — Plan of Care (Signed)
  Problem: Fluid Volume: Goal: Hemodynamic stability will improve Outcome: Progressing   Problem: Clinical Measurements: Goal: Diagnostic test results will improve Outcome: Progressing Goal: Signs and symptoms of infection will decrease Outcome: Progressing   Problem: Respiratory: Goal: Ability to maintain adequate ventilation will improve Outcome: Progressing   Problem: Education: Goal: Knowledge of risk factors and measures for prevention of condition will improve Outcome: Progressing   Problem: Coping: Goal: Psychosocial and spiritual needs will be supported Outcome: Progressing   Problem: Respiratory: Goal: Will maintain a patent airway Outcome: Progressing Goal: Complications related to the disease process, condition or treatment will be avoided or minimized Outcome: Progressing   Problem: Education: Goal: Ability to describe self-care measures that may prevent or decrease complications (Diabetes Survival Skills Education) will improve Outcome: Progressing   Problem: Coping: Goal: Ability to adjust to condition or change in health will improve Outcome: Progressing   Problem: Fluid Volume: Goal: Ability to maintain a balanced intake and output will improve Outcome: Progressing   Problem: Health Behavior/Discharge Planning: Goal: Ability to manage health-related needs will improve Outcome: Progressing   Problem: Metabolic: Goal: Ability to maintain appropriate glucose levels will improve Outcome: Progressing   Problem: Nutritional: Goal: Maintenance of adequate nutrition will improve Outcome: Progressing Goal: Progress toward achieving an optimal weight will improve Outcome: Progressing   Problem: Skin Integrity: Goal: Risk for impaired skin integrity will decrease Outcome: Progressing   Problem: Tissue Perfusion: Goal: Adequacy of tissue perfusion will improve Outcome: Progressing   Problem: Education: Goal: Knowledge of General Education information  will improve Description: Including pain rating scale, medication(s)/side effects and non-pharmacologic comfort measures Outcome: Progressing   Problem: Health Behavior/Discharge Planning: Goal: Ability to manage health-related needs will improve Outcome: Progressing   Problem: Clinical Measurements: Goal: Ability to maintain clinical measurements within normal limits will improve Outcome: Progressing Goal: Will remain free from infection Outcome: Progressing Goal: Diagnostic test results will improve Outcome: Progressing Goal: Respiratory complications will improve Outcome: Progressing Goal: Cardiovascular complication will be avoided Outcome: Progressing   Problem: Activity: Goal: Risk for activity intolerance will decrease Outcome: Progressing   Problem: Nutrition: Goal: Adequate nutrition will be maintained Outcome: Progressing   Problem: Coping: Goal: Level of anxiety will decrease Outcome: Progressing   Problem: Elimination: Goal: Will not experience complications related to bowel motility Outcome: Progressing Goal: Will not experience complications related to urinary retention Outcome: Progressing   Problem: Pain Managment: Goal: General experience of comfort will improve and/or be controlled Outcome: Progressing   Problem: Safety: Goal: Ability to remain free from injury will improve Outcome: Progressing   Problem: Skin Integrity: Goal: Risk for impaired skin integrity will decrease Outcome: Progressing

## 2024-04-29 ENCOUNTER — Telehealth (HOSPITAL_COMMUNITY): Payer: Self-pay | Admitting: Cardiology

## 2024-04-29 ENCOUNTER — Inpatient Hospital Stay (HOSPITAL_COMMUNITY)

## 2024-04-29 DIAGNOSIS — I4891 Unspecified atrial fibrillation: Secondary | ICD-10-CM | POA: Diagnosis not present

## 2024-04-29 DIAGNOSIS — E8582 Wild-type transthyretin-related (ATTR) amyloidosis: Secondary | ICD-10-CM

## 2024-04-29 DIAGNOSIS — I4892 Unspecified atrial flutter: Secondary | ICD-10-CM | POA: Diagnosis not present

## 2024-04-29 DIAGNOSIS — A419 Sepsis, unspecified organism: Secondary | ICD-10-CM | POA: Diagnosis not present

## 2024-04-29 DIAGNOSIS — I5022 Chronic systolic (congestive) heart failure: Secondary | ICD-10-CM | POA: Diagnosis not present

## 2024-04-29 DIAGNOSIS — L98429 Non-pressure chronic ulcer of back with unspecified severity: Secondary | ICD-10-CM | POA: Diagnosis not present

## 2024-04-29 DIAGNOSIS — E854 Organ-limited amyloidosis: Secondary | ICD-10-CM | POA: Diagnosis not present

## 2024-04-29 LAB — HEPATIC FUNCTION PANEL
ALT: 41 U/L (ref 0–44)
AST: 35 U/L (ref 15–41)
Albumin: 2.1 g/dL — ABNORMAL LOW (ref 3.5–5.0)
Alkaline Phosphatase: 124 U/L (ref 38–126)
Bilirubin, Direct: 0.1 mg/dL (ref 0.0–0.2)
Indirect Bilirubin: 0.6 mg/dL (ref 0.3–0.9)
Total Bilirubin: 0.7 mg/dL (ref 0.0–1.2)
Total Protein: 5.9 g/dL — ABNORMAL LOW (ref 6.5–8.1)

## 2024-04-29 LAB — GLUCOSE, CAPILLARY
Glucose-Capillary: 142 mg/dL — ABNORMAL HIGH (ref 70–99)
Glucose-Capillary: 142 mg/dL — ABNORMAL HIGH (ref 70–99)
Glucose-Capillary: 286 mg/dL — ABNORMAL HIGH (ref 70–99)
Glucose-Capillary: 95 mg/dL (ref 70–99)
Glucose-Capillary: 35 mg/dL — CL (ref 70–99)
Glucose-Capillary: 109 mg/dL — ABNORMAL HIGH (ref 70–99)
Glucose-Capillary: 21 mg/dL — CL (ref 70–99)
Glucose-Capillary: 96 mg/dL (ref 70–99)
Glucose-Capillary: 56 mg/dL — ABNORMAL LOW (ref 70–99)
Glucose-Capillary: 136 mg/dL — ABNORMAL HIGH (ref 70–99)
Glucose-Capillary: 89 mg/dL (ref 70–99)

## 2024-04-29 MED ORDER — DEXTROSE 50 % IV SOLN
1.0000 | Freq: Once | INTRAVENOUS | Status: AC
  Administered 2024-04-29: 50 mL via INTRAVENOUS

## 2024-04-29 MED ORDER — DEXTROSE 50 % IV SOLN
INTRAVENOUS | Status: AC
Start: 2024-04-29 — End: 2024-04-29
  Administered 2024-04-29: 50 mL
  Filled 2024-04-29: qty 50

## 2024-04-29 MED ORDER — SODIUM CHLORIDE 0.9% FLUSH: 10.0000 mL | INTRAVENOUS | Status: DC | PRN

## 2024-04-29 MED ORDER — SODIUM CHLORIDE 0.9% FLUSH
10.0000 mL | Freq: Two times a day (BID) | INTRAVENOUS | Status: DC
  Administered 2024-04-29 – 2024-05-02 (×6): 10 mL
  Administered 2024-05-04: 20 mL
  Administered 2024-05-04 – 2024-06-23 (×100): 10 mL
  Administered 2024-06-24: 30 mL
  Administered 2024-06-24 – 2024-07-07 (×28): 10 mL
  Administered 2024-07-07: 20 mL
  Administered 2024-07-08 – 2024-08-22 (×91): 10 mL

## 2024-04-29 MED ORDER — FUROSEMIDE 10 MG/ML IJ SOLN
20.0000 mg | Freq: Once | INTRAMUSCULAR | Status: AC
  Administered 2024-04-29: 20 mg via INTRAVENOUS
  Filled 2024-04-29: qty 2

## 2024-04-29 MED ORDER — VUTRISIRAN SODIUM 25 MG/0.5ML ~~LOC~~ SOSY
25.0000 mg | PREFILLED_SYRINGE | SUBCUTANEOUS | Status: DC
  Administered 2024-04-29: 25 mg via SUBCUTANEOUS
  Filled 2024-04-29 (×2): qty 0.5

## 2024-04-29 MED ORDER — DEXTROSE 10 % IV SOLN
INTRAVENOUS | Status: DC
  Filled 2024-04-29 (×6): qty 1000

## 2024-04-29 MED ORDER — DEXTROSE 50 % IV SOLN
INTRAVENOUS | Status: AC
  Filled 2024-04-29: qty 100

## 2024-04-29 MED ORDER — DEXTROSE 50 % IV SOLN: 25.0000 g | INTRAVENOUS | Status: AC

## 2024-04-29 NOTE — Significant Event (Signed)
 Patient's blood sugar was found to be in the 50s.  Patient is alert and awake.  D50 was given repeat check shows that blood sugar has further decreased to 30s.  At this point another dose of D50 and D10 has been started discontinuing patient's insulin  glargine and all insulin  coverage.  Starting patient on D10.  Transferring patient to progressive care.  Will check CBGs closely.  Melford Spotted.

## 2024-04-29 NOTE — Progress Notes (Signed)

## 2024-04-29 NOTE — Plan of Care (Signed)
  Problem: Fluid Volume: Goal: Hemodynamic stability will improve Outcome: Progressing   Problem: Clinical Measurements: Goal: Diagnostic test results will improve Outcome: Progressing Goal: Signs and symptoms of infection will decrease Outcome: Progressing   Problem: Respiratory: Goal: Ability to maintain adequate ventilation will improve Outcome: Progressing   Problem: Education: Goal: Knowledge of risk factors and measures for prevention of condition will improve Outcome: Progressing   Problem: Coping: Goal: Psychosocial and spiritual needs will be supported Outcome: Progressing   Problem: Respiratory: Goal: Will maintain a patent airway Outcome: Progressing Goal: Complications related to the disease process, condition or treatment will be avoided or minimized Outcome: Progressing   Problem: Education: Goal: Ability to describe self-care measures that may prevent or decrease complications (Diabetes Survival Skills Education) will improve Outcome: Progressing   Problem: Coping: Goal: Ability to adjust to condition or change in health will improve Outcome: Progressing   Problem: Fluid Volume: Goal: Ability to maintain a balanced intake and output will improve Outcome: Progressing   Problem: Health Behavior/Discharge Planning: Goal: Ability to manage health-related needs will improve Outcome: Progressing   Problem: Metabolic: Goal: Ability to maintain appropriate glucose levels will improve Outcome: Progressing   Problem: Nutritional: Goal: Maintenance of adequate nutrition will improve Outcome: Progressing Goal: Progress toward achieving an optimal weight will improve Outcome: Progressing   Problem: Skin Integrity: Goal: Risk for impaired skin integrity will decrease Outcome: Progressing   Problem: Tissue Perfusion: Goal: Adequacy of tissue perfusion will improve Outcome: Progressing   Problem: Education: Goal: Knowledge of General Education information  will improve Description: Including pain rating scale, medication(s)/side effects and non-pharmacologic comfort measures Outcome: Progressing   Problem: Health Behavior/Discharge Planning: Goal: Ability to manage health-related needs will improve Outcome: Progressing   Problem: Clinical Measurements: Goal: Ability to maintain clinical measurements within normal limits will improve Outcome: Progressing Goal: Will remain free from infection Outcome: Progressing Goal: Diagnostic test results will improve Outcome: Progressing Goal: Respiratory complications will improve Outcome: Progressing Goal: Cardiovascular complication will be avoided Outcome: Progressing   Problem: Activity: Goal: Risk for activity intolerance will decrease Outcome: Progressing   Problem: Nutrition: Goal: Adequate nutrition will be maintained Outcome: Progressing   Problem: Coping: Goal: Level of anxiety will decrease Outcome: Progressing   Problem: Elimination: Goal: Will not experience complications related to bowel motility Outcome: Progressing Goal: Will not experience complications related to urinary retention Outcome: Progressing   Problem: Pain Managment: Goal: General experience of comfort will improve and/or be controlled Outcome: Progressing   Problem: Safety: Goal: Ability to remain free from injury will improve Outcome: Progressing   Problem: Skin Integrity: Goal: Risk for impaired skin integrity will decrease Outcome: Progressing

## 2024-04-29 NOTE — Progress Notes (Addendum)
 Rounding Note   Patient Name: Earl [name] Date of Encounter: 04/29/2024  [facility] Cardiologist: Richardo Chandler, MD   Subjective Patient sleeping comfortably during discussion. See note about conversation with daughter.   Scheduled Meds:  acetaminophen   500 mg Oral TID   amiodarone   200 mg Oral Daily   apixaban   5 mg Oral BID   atorvastatin   40 mg Oral Daily   Chlorhexidine  Gluconate Cloth  6 each Topical Daily   collagenase    Topical Daily   diclofenac  Sodium  4 g Topical QID   feeding supplement  237 mL Oral BID BM   ferrous sulfate   325 mg Oral QHS   furosemide   20 mg Oral Daily   guaiFENesin   600 mg Oral BID   insulin  aspart  0-15 Units Subcutaneous TID WC   insulin  aspart  0-5 Units Subcutaneous QHS   insulin  glargine-yfgn  7 Units Subcutaneous Daily   levothyroxine   50 mcg Oral Q0600   liver oil-zinc  oxide   Topical BID   multivitamin with minerals  1 tablet Oral Daily   nutrition supplement (JUVEN)  1 packet Oral BID BM   mouth rinse  15 mL Mouth Rinse 4 times per day   oxyCODONE   10 mg Oral QHS   phosphorus  500 mg Oral TID   polyethylene glycol  17 g Oral BID   senna-docusate  1 tablet Oral QHS   sodium chloride  flush  3 mL Intravenous Q12H   Tafamidis   1 capsule Oral Daily   vutrisiran sodium  25 mg Subcutaneous Q90 days   Continuous Infusions:  cefTAZidime (FORTAZ)  IV 2 g (04/29/24 1620)   PRN Meds: acetaminophen  **OR** acetaminophen , food thickener, lip balm, ondansetron  **OR** ondansetron  (ZOFRAN ) IV, mouth rinse, oxyCODONE , polyethylene glycol, artificial tears, sodium chloride  flush   Vital Signs  Vitals:   04/29/24 0741 04/29/24 1212 04/29/24 1316 04/29/24 1617  BP: 106/63 125/81 111/79 126/75  Pulse: 76 86 79 71  Resp: 19 19 19 19   Temp: (!) 97.5 F (36.4 C) 97.9 F (36.6 C) 98 F (36.7 C) 97.7 F (36.5 C)  TempSrc:      SpO2: 96% 98% 100% 100%  Weight:      Height:        Intake/Output Summary (Last 24 hours) at  04/29/2024 1724 Last data filed at 04/29/2024 1551 Gross per 24 hour  Intake 650 ml  Output 2575 ml  Net -1925 ml      04/29/2024    5:00 AM 04/26/2024    5:00 AM 04/22/2024    5:00 AM  Last 3 Weights  Weight (lbs) -- 180 lb 165 lb  Weight (kg) -- 81.647 kg 74.844 kg      Telemetry NSR HR 70s - Personally Reviewed  Physical Exam Per MD  Labs High Sensitivity Troponin:  No results for input(s): "TROPONINIHS" in the last 720 hours.   Chemistry Recent Labs  Lab 04/24/24 0558 04/24/24 2000 04/28/24 0821 04/29/24 1356  NA 141  --  141  --   K 3.6  --  3.5  --   CL 111  --  106  --   CO2 20*  --  23  --   GLUCOSE 91  --  106*  --   BUN 30*  --  29*  --   CREATININE 0.85  --  1.01  --   CALCIUM  8.4*  --  8.2*  --   MG 1.8 1.9  --   --  PROT  --   --   --  5.9*  ALBUMIN   --   --   --  2.1*  AST  --   --   --  35  ALT  --   --   --  41  ALKPHOS  --   --   --  124  BILITOT  --   --   --  0.7  GFRNONAA >60  --  >60  --   ANIONGAP 10  --  12  --     Lipids No results for input(s): "CHOL", "TRIG", "HDL", "LABVLDL", "LDLCALC", "CHOLHDL" in the last 168 hours.  Hematology Recent Labs  Lab 04/24/24 0558 04/28/24 0821  WBC 6.0 5.2  RBC 3.66* 3.77*  HGB 10.3* 10.7*  HCT 32.2* 33.2*  MCV 88.0 88.1  MCH 28.1 28.4  MCHC 32.0 32.2  RDW 16.5* 17.0*  PLT 307 292   Thyroid  No results for input(s): "TSH", "FREET4" in the last 168 hours.  BNPNo results for input(s): "BNP", "PROBNP" in the last 168 hours.  DDimer No results for input(s): "DDIMER" in the last 168 hours.   Radiology  No results found.  Cardiac Studies No new studies since last seen on 03/11/24   Patient Profile   83 y.o. 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 Further work-up: Cardiolite showed no ischemia. RHC/LHC showed mild nonobstructive CAD with optimized filling pressures. cMR showed mid-wall patchy LGE in the basal to mid septum, ECV 52% in septum. 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 Started on vutrisiran. In 2024 repeat echo showed EF 45-50%.  Presented to the ED on 3/27 for generalized weakness. Admitted for rhabdomyolysis and COVID infection. His course has remained complicated by autonomic dysfunction/neuropathy (orthostatic symptoms) and low blood pressure precluding GDMT therapy. Advanced heart failure consulted early in hospital course and was able to add back some GDMT, he was discharged on 02/29/24. Unfortunately he returned with sepsis secondary to sacral wound on 03/04/24, general cardiology was consulted for AF RVR (see below). During his second admission blood pressure remained low and he was placed on midodrine  5 mg TID. From a cardiac perspective he was stable, and we signed off on 03/11/24.  Since then primary team was able to wean off midodrine  (last dose 5/31). GDMT has not been restarted.  Blood pressure over last 24 hours ( 106/63- 126/75). HR 71-86. Daughter reports ascites and peripheral edema in dependent space.  Was given an additional IV Lasix  20 appropriate urine output.  Albumin  today was 2.1.  Spoke with daughter extensively about goals of care and further management.  Will give as needed IV Lasix  being forward for comfort.   -continue lasix  20 mg daily   -continue tafamidis  CAPS 61 mg -continue Amvuttra   Atrial fibrillation on eliquis   Atrial Flutter During 03/04/24 admission, consulted for AF RVR. He was started on IV amiodarone  and digoxin , once NSR was achieved he was  transition to oral amiodarone  and digoxin  was stopped. From a cardiac perspective he was  stable, and we signed off on 03/11/24. Since then patient has remained on cardiac telemetry due to daughter's preference. In NSR today 04/29/24.   -continue eliquis  5 mg BID -continue amiodarone  200 mg  4. HLD -continue lipitor 40 mg  Per primary 4.T2DM 5. Hypothyroidism 6. Chronic pain 7. Sacral wound   For questions or updates, please contact [facility] Please consult [email or web address] for contact info under     Signed, Mabel Savage, PA-C  04/29/2024, 5:24 PM     Attending Note:   The patient was seen and examined.  Agree with assessment and plan as noted above.  Changes made to the above note as needed.  Patient seen and independently examined with Willis Harter, PA .   We discussed all aspects of the encounter. I agree with the assessment and plan as stated above.   We met with his daughter, Para Bowe to discuss his condition and other issues.     Chronic HFrEF due to TTR Amyloidosis Unfortunately, Mr. Roemer has what appears to be end stage CHF due to amyloidosis.  He is chronically malnourished and now has an infected decubitus ulcer .  His PO intake is very limited - mostly due to abdominal discomfort.    Dr. Sreeram is trying PRN lasix  in order to relieve the abdominal pain and pressure from the edema.  I explained to the patient's daughter   Para that it would be very difficult to completely resolve this edema since his albumin  is so low   Para realizes that his prognosis is poor and that his options are gradually but steadily decreasing .  He has significant dysautonomia that make BP management difficult .   He has been tried on standard GDMT without any reall success.   We have been limited, mostly by hypotension despite using midodrine    I think that our chances of significantly improving his overall condition to the point that he could return home are  extremely low and that at some point the patient may be better served by opting for a comfort care approach rather than trying to achieve a cure in his issues.  Para knows that his options are very limited but is not ready to change to a comfort care plan at this time .  We will see the patient on an as needed basis      I have spent a total of 60  minutes with patient reviewing hospital  notes , telemetry, EKGs, labs and examining patient as well as establishing an assessment and plan that was discussed with the patient.  > 50% of time was spent in direct patient care.    [place], Marieta Shorten., MD, [facility] 04/29/2024, 5:36 PM [address]  [address] Office [phone] Pager [phone]

## 2024-04-29 NOTE — Progress Notes (Signed)
 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 Critical care was consulted for persistent hypotension. Respiratory panel was negative. General surgery was consulted for sacral wound debridement with bedside excision on 03/05/2024 and 03/07/2024. He is now hemodynamically stable for discharge.    6/4: Some concern of increasing discharge and some order from wound so wound care was reconsulted-repeat wound cultures were sent.  Daughter concerned of some shortness of breath which was not observed during rounds, chest x-ray with mild bibasilar subsegmental atelectasis or edema with small pleural effusions.  Remained on room air.   6/5:Hemodynamically stable.  Repeat cultures from wound yesterday with no organism.  6/6 - he is lying in bed, weak, does not wish to go to any facility.   6/7: Wound cultures 6/4 back with pseudomonas, klebsiella.   6/8: daughter at bedside, wishes he can be taken out of room for his birthday.   6/9- Seems more short of breath, feels dizzy, lightheaded. Daughter at bedside concerned about his abdominal distension, discomfort. Also has bilateral thigh swelling.  Assessment and Plan: Severe sepsis  On presentation, lactic acidosis, leukocytosis, fever, secondary to infected chronic wound.  Blood cultures drawn, empiric IV  fluid bolus, empiric antibiotics.  Sepsis now resolved.   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  Completed antibiotics 5/1.  General surgery now signed off.  Wound care following.  Infectious disease signed off.  Continue wound care management/regimen.  Offload pressure with every 2 hours turns, adequate nutrition.  Continues showing improvement.  Will continue current regimen:   Cleanse with Vashe#[id number]. Apply Aquacel Ag+ (silver hydrofiber) Lawson # [phone] in the wound bed and fill the tunneling using a swab (change daily), cover with sacral foam dressing, change daily and PRN soilage Repeat cultures were sent on 6/4 grew klebsiella, pseudomonas. Sensitivities reviewed. continue IV ceftazidime while inpatient as per pharmacy protocol.   Other pressure injuries Continue wound care, off loading, as ordered.   Challenging as he is bed bound. Air mattress, frequent positioning for off loading.   Acute on chronic pain - secondary to above Continue OxyContin  10mg  at bedtime, with oxycodone  2.5mg  q4hr prn.  Plan to wean off opioids entirely by discharge.  Next step to wean bedtime oxycontin , possibly with scheduled oxy 5mg  at night.   Paroxysmal atrial fibrillation Continue amiodarone  and Eliquis .  Cardiology signed off.  Continues on telemetry due to daughter's preference.   HFrEF/cardiac amyloidosis/NICM On tafamadis for amyloid.  GDMT not in place secondary to hypotension.  Cardiology signed off for now. Off midodrine .  Continue to attempt to initiate GDMT slowly. Continue oral Lasix  20 mg daily.  Will give IV Lasix  one time dose due to his leg swelling, shortness of breath. Asked Cardiology team to talk to daughter at bedside. Explained to daughter that swelling could be from severe low albumin  too.   Insulin -dependent diabetes mellitus Continues  on glargine 7 units daily plus insulin  sliding scale. Blood sugars labile.  Continue to  monitor for hypoglycemia.   Anemia of chronic disease S/p 1 unit PRBC 4/30.  Hemoglobin stable, no active bleeding.   Dysphagia/severe protein calorie malnutrition Continue 3 times daily protein calorie supplementation.  Vitamin supplementation.  Continues on pured diet.  Daughter noted that he has improvement in appetite.     Physical debilitation of muscle weakness PT/ OT on board.  However, limited ability to sit upright in chair secondary to pain from sacral ulcer.   He is overall muscle strength and weakness. Today he is struggling to talk, dizzy and light headed. Will get CT head r/o stroke.   Goals of care Patient is medically appropriate for discharge at this time. He is DNR-limited intervention. Working closely with TOC family on disposition planning. Patient refuses to go to any facility and wishes to go home.   Daughter wants to make sure wounds are better before transition out of the hospital.  However given his debility, STR or fulltime care would likely be required. His overall prognosis is poor.  Nutrition Documentation    Flowsheet Row ED to Hosp-Admission (Current) from 03/04/2024 in [facility]  Nutrition Problem Severe Malnutrition  Etiology chronic illness  Nutrition Goal Patient will meet greater than or equal to 90% of their needs  Interventions Ensure Enlive (each supplement provides 350kcal and 20 grams of protein), Magic cup, Hormel Shake, MVI, Juven, Other (Comment)  [vitamin C ]     ,  Active Pressure Injury/Wound(s)     Pressure Ulcer  Duration          Pressure Injury 02/26/24 Coccyx Mid Stage 4 - Full thickness tissue loss with exposed bone, tendon or muscle. 63 days   Pressure Injury 02/26/24 Toe (Comment  which one) Anterior;Right Deep Tissue Pressure Injury - Purple or maroon localized area of discolored intact skin or blood-filled blister due to damage of underlying soft tissue from pressure and/or shear. right gre 63 days    Pressure Injury 03/06/24 Hip Anterior;Left;Proximal Unstageable - Full thickness tissue loss in which the base of the injury is covered by slough (yellow, tan, gray, green or brown) and/or eschar (tan, brown or black) in the wound bed. unstageable 53 days   Pressure Injury 03/06/24 Shoulder Left Unstageable - Full thickness tissue loss in which the base of the injury is covered by slough (yellow, tan, gray, green or brown) and/or eschar (tan, brown or black) in the wound bed. unstageable 53 days   Pressure Injury 04/23/24 Heel Right Unstageable - Full thickness tissue loss in which the base of the injury is covered by slough (yellow, tan, gray, green or brown) and/or eschar (tan, brown or black) in the wound bed. DTPI evolving into unstageable 6 days           (Optional):[id number]}   Out of bed to chair. Incentive spirometry. Nursing supportive care. Fall, aspiration precautions. Diet:  Diet Orders (From admission, onward)     Start     Ordered   03/17/24 1054  DIET - DYS 1 Room service appropriate? Yes; Fluid consistency: Thin  Diet effective now       Question Answer Comment  Room service appropriate? Yes   Fluid consistency: Thin      03/17/24 1053           DVT prophylaxis: SCDs Start: 03/04/24 2151 Place TED hose Start: 03/04/24 2151 apixaban  (ELIQUIS ) tablet 5 mg  Level  of care: Telemetry Medical   Code Status: Limited: Do not attempt resuscitation (DNR) -DNR-LIMITED -Do Not Intubate/DNI   Subjective: Patient is seen and examined today morning. He is short of breath, has difficulty speaking. Daughter noted abdomen distended, bilateral thigh swelling. Also asks if we can check on the left hip wound.  Physical Exam: Vitals:   04/28/24 2046 04/29/24 0418 04/29/24 0741 04/29/24 1212  BP: 129/71 123/80 106/63 125/81  Pulse: 82 85 76 86  Resp: 18 18 19 19   Temp: 98 F (36.7 C) (!) 97.5 F (36.4 C) (!) 97.5 F (36.4 C) 97.9 F (36.6 C)  TempSrc:      SpO2: 94% 99% 96% 98%   Weight:      Height:        General - Elderly ill African American male, mild respiratory distress HEENT - PERRLA, EOMI, atraumatic head, non tender sinuses. Lung - Clear, basal rales, rhonchi, wheezes. Heart - S1, S2 heard, no murmurs, rubs, b/l thigh, abdomen swelling. Abdomen - Soft, non tender, bowel sounds good Neuro - Alert, awake and oriented, non focal exam. Skin - Warm and dry. Left hip, back, heel dressings noted.  Data Reviewed:      Latest Ref Rng & Units 04/28/2024    8:21 AM 04/24/2024    5:58 AM 04/19/2024    8:52 AM  CBC  WBC 4.0 - 10.5 K/uL 5.2  6.0  6.1   Hemoglobin 13.0 - 17.0 g/dL 40.3  47.4  25.9   Hematocrit 39.0 - 52.0 % 33.2  32.2  32.0   Platelets 150 - 400 K/uL 292  307  310       Latest Ref Rng & Units 04/28/2024    8:21 AM 04/24/2024    5:58 AM 04/19/2024    8:52 AM  BMP  Glucose 70 - 99 mg/dL 563  91  875   BUN 8 - 23 mg/dL 29  30  30    Creatinine 0.61 - 1.24 mg/dL 6.43  3.29  5.18   Sodium 135 - 145 mmol/L 141  141  139   Potassium 3.5 - 5.1 mmol/L 3.5  3.6  4.0   Chloride 98 - 111 mmol/L 106  111  108   CO2 22 - 32 mmol/L 23  20  24    Calcium  8.9 - 10.3 mg/dL 8.2  8.4  8.4    No results found.  Family Communication: Discussed with patient, daughter at bedside. They understand and agree. All questions answered.  Disposition: Status is: Inpatient Remains inpatient appropriate because: family discussion, placement, IV lasix , IV antibiotics, CT head  Planned Discharge Destination: Rehab     Time spent: 42 minutes  Author: Aisha Hove, MD 04/29/2024 12:37 PM Secure chat 7am to 7pm For on call review [email or web address].

## 2024-04-29 NOTE — Telephone Encounter (Signed)
 Patients wife called to report additional details are needed for pt to begin water therapy   Returned call to [facility] for rehab director [phone]

## 2024-04-30 DIAGNOSIS — L98429 Non-pressure chronic ulcer of back with unspecified severity: Secondary | ICD-10-CM | POA: Diagnosis not present

## 2024-04-30 DIAGNOSIS — I4891 Unspecified atrial fibrillation: Secondary | ICD-10-CM | POA: Diagnosis not present

## 2024-04-30 DIAGNOSIS — A419 Sepsis, unspecified organism: Secondary | ICD-10-CM | POA: Diagnosis not present

## 2024-04-30 DIAGNOSIS — E854 Organ-limited amyloidosis: Secondary | ICD-10-CM | POA: Diagnosis not present

## 2024-04-30 LAB — GLUCOSE, CAPILLARY
Glucose-Capillary: 117 mg/dL — ABNORMAL HIGH (ref 70–99)
Glucose-Capillary: 118 mg/dL — ABNORMAL HIGH (ref 70–99)
Glucose-Capillary: 122 mg/dL — ABNORMAL HIGH (ref 70–99)
Glucose-Capillary: 127 mg/dL — ABNORMAL HIGH (ref 70–99)
Glucose-Capillary: 226 mg/dL — ABNORMAL HIGH (ref 70–99)
Glucose-Capillary: 237 mg/dL — ABNORMAL HIGH (ref 70–99)
Glucose-Capillary: 239 mg/dL — ABNORMAL HIGH (ref 70–99)
Glucose-Capillary: 274 mg/dL — ABNORMAL HIGH (ref 70–99)
Glucose-Capillary: 317 mg/dL — ABNORMAL HIGH (ref 70–99)
Glucose-Capillary: 97 mg/dL (ref 70–99)

## 2024-04-30 MED ORDER — ENSURE ENLIVE PO LIQD
237.0000 mL | Freq: Two times a day (BID) | ORAL | Status: DC
  Administered 2024-04-30 – 2024-05-14 (×26): 237 mL via ORAL
  Filled 2024-04-30 (×31): qty 237

## 2024-04-30 NOTE — Progress Notes (Addendum)
 Physical Therapy Treatment Patient Details Name: Earl [name] MRN: [id number] DOB: 1941/02/24 Today's Date: 04/30/2024   History of Present Illness 83 y.o. male admitted from [facility] rehab on 03/04/24 with SOB, sacral pain, Afib with RVR. Pt with acute metabolic encephalopathy, rhabdomyolysis and sepsis secondary to sacral wound infection. 4/15 & 4/17 bedside sacral wound debridement. PMhx: adm 3/26-4/08/2024 with fall, AMS, COVID; d/c to SNF rehab. CVA (2021, residual R-side weakness), T2DM, HTN, CHF, MGUS, Afib on Eliquis .    PT Comments  Pt received in supine, agreeable to therapy session and with good participation and tolerance for transfer and supine/seated exercise instruction for strengthening. Working on pt ability to assist with his own care to reduce caregiver burden including cross-body reaching and pulling when rolling and pt working on core activation for upright sitting in chair in addition to standing trials from chair with Stedy. Pt continues to require up to +2 maxA for standing trials and totalA +2 for safety with OOB to chair transfers via hoyer lift. Pt participating well today after premedication and agrees to try sitting up in chair at least 30 mins, as pain allows, daughter Para present and encouraging him. Patient will benefit from continued inpatient follow up therapy, <3 hours/day.    If plan is discharge home, recommend the following: Two people to help with walking and/or transfers;Two people to help with bathing/dressing/bathroom;Assistance with feeding;Assist for transportation   Can travel by private vehicle     No  Equipment Recommendations  Hospital bed;Hoyer lift    Recommendations for Other Services       Precautions / Restrictions Precautions Precautions: Fall;Other (comment) Recall of Precautions/Restrictions: Intact Precaution/Restrictions Comments: significant pain from sacral wound, tends to want check-in/premedication prior to EOB/OOB  but varies Restrictions Weight Bearing Restrictions Per Provider Order: No     Mobility  Bed Mobility Overal bed mobility: Needs Assistance Bed Mobility: Rolling Rolling: Max assist, Used rails         General bed mobility comments: Mod to maxA to roll with mod cues and use of bed features/rails once HOB flattened; x2 rolls to ea side while hoyer pad placed    Transfers Overall transfer level: Needs assistance   Transfers: Bed to chair/wheelchair/BSC, Sit to/from Stand Sit to Stand: Max assist, +2 physical assistance, Via lift equipment           General transfer comment: bed>chair via maxi-move, then STS from cushioned chair<>Stedy x3 trials wtih +2 maxA gait belt and bed pad assist with pt maintaining slightly crouched posture not totally extending bil knees or hips. Transfer via Lift Equipment: Veronica Gordon  Ambulation/Gait               General Gait Details: unable   Optometrist     Tilt Bed    Modified Rankin (Stroke Patients Only)       Balance Overall balance assessment: Needs assistance Sitting-balance support: Bilateral upper extremity supported, Feet supported, No upper extremity supported Sitting balance-Leahy Scale: Poor Sitting balance - Comments: BUE supported by arm rests able to sit forward in chair with CGA but reliant on UE support, poor unsupported Postural control: Posterior lean Standing balance support: Bilateral upper extremity supported, Reliant on assistive device for balance Standing balance-Leahy Scale: Zero Standing balance comment: +2 maxA static standing at Stedy ~5-10 seconds ea  Communication Communication Communication: Impaired Factors Affecting Communication: Reduced clarity of speech (appears to be his baseline)  Cognition Arousal: Alert Behavior During Therapy: WFL for tasks assessed/performed   PT - Cognitive impairments: Problem  solving, Sequencing, Memory, Safety/Judgement, Initiation                       PT - Cognition Comments: Slightly slower processing today but he was premedicated before session; pt cooperative and following simple commands well. Following commands: Impaired Following commands impaired: Only follows one step commands consistently    Cueing Cueing Techniques: Verbal cues, Tactile cues, Gestural cues  Exercises General Exercises - Lower Extremity Heel Slides: AROM, Both, Supine, 5 reps Hip Flexion/Marching: AROM, Both, 5 reps, Seated Other Exercises Other Exercises: forward leans away from back of chair rest, pulling with BUE on arm rests x5 reps Other Exercises: STS x3 from cushioned chair height to Stedy and standing 5-10 seconds ea rep    General Comments General comments (skin integrity, edema, etc.): special pressure relief cushion in chair under him, daughter in room and reporting she will help him to try eating while he sits up in the chair      Pertinent Vitals/Pain Pain Assessment Pain Assessment: Faces Faces Pain Scale: Hurts a little bit Pain Location: generalized Pain Descriptors / Indicators: Aching, Discomfort, Grimacing Pain Intervention(s): Limited activity within patient's tolerance, Monitored during session, Premedicated before session, Repositioned, Other (comment) (specialty pressure relief cushion ~6" thick from PT dept)    Home Living                          Prior Function            PT Goals (current goals can now be found in the care plan section) Acute Rehab PT Goals Patient Stated Goal: To improve strength and mobility to reduce risk for falls. PT Goal Formulation: With patient Time For Goal Achievement: 05/10/24 Progress towards PT goals: Progressing toward goals    Frequency    Min 1X/week      PT Plan      Co-evaluation              AM-PAC PT "6 Clicks" Mobility   Outcome Measure  Help needed turning from  your back to your side while in a flat bed without using bedrails?: A Lot Help needed moving from lying on your back to sitting on the side of a flat bed without using bedrails?: Total (+2 assist) Help needed moving to and from a bed to a chair (including a wheelchair)?: Total Help needed standing up from a chair using your arms (e.g., wheelchair or bedside chair)?: Total Help needed to walk in hospital room?: Total Help needed climbing 3-5 steps with a railing? : Total 6 Click Score: 7    End of Session Equipment Utilized During Treatment: Gait belt;Other (comment) (hoyer lift, Stedy) Activity Tolerance: Patient tolerated treatment well Patient left: with call bell/phone within reach;in chair;with family/visitor present;Other (comment) (daughter in room wtih him and agreeable to notify RN when he is ready to be moved back to bed via hoyer) Nurse Communication: Mobility status;Need for lift equipment PT Visit Diagnosis: Muscle weakness (generalized) (M62.81);Adult, failure to thrive (R62.7);Difficulty in walking, not elsewhere classified (R26.2);Pain;Other abnormalities of gait and mobility (R26.89) Pain - Right/Left: Left Pain - part of body:  (bottom/back)     Time: 1610-9604 PT Time Calculation (min) (ACUTE ONLY): 39 min  Charges:    $  Therapeutic Exercise: 8-22 mins $Therapeutic Activity: 23-37 mins PT General Charges $$ ACUTE PT VISIT: 1 Visit                     Kionte Baumgardner P., PTA Acute Rehabilitation Services Secure Chat Preferred 9a-5:30pm Office: [phone]    Mariel Shope [facility] 04/30/2024, 1:41 PM

## 2024-04-30 NOTE — TOC Progression Note (Signed)
 Transition of Care [facility]) - Progression Note    Patient Details  Name: Earl [name] MRN: [id number] Date of Birth: [birth year]  Transition of Care [facility] CM/SW Contact  Kahlel Peake A [place], LCSW Phone Number: 04/30/2024, 4:34 PM  Clinical Narrative:     CSW met with pt and pt's daughter Para at bedside. Continued discussion for placement, she stated made attempt to send document regarding SNF information questions, CSW has not received. She said will make another attempt. She said pt "had a bad night" and having issues with his sugars and "new infection." Reiterated she does not want pt to DC to rehab, while she knows it is imperative, however emphasized desire for improvement of his wounds before discharge. Continued interest for home option if possible, requested discussion once documentation has been sent.    TOC will continue to follow.   Expected Discharge Plan: Skilled Nursing Facility Barriers to Discharge: Continued Medical Work up  Expected Discharge Plan and Services In-house Referral: Clinical Social Work     Living arrangements for the past 2 months:  (from [facility] short term rehab)                                       Social Determinants of Health (SDOH) Interventions SDOH Screenings   Food Insecurity: No Food Insecurity (03/05/2024)  Housing: Low Risk  (03/05/2024)  Transportation Needs: No Transportation Needs (03/05/2024)  Utilities: Not At Risk (03/05/2024)  Depression (PHQ2-9): Low Risk  (10/03/2023)  Financial Resource Strain: Patient Declined (01/16/2024)  Physical Activity: Unknown (01/16/2024)  Social Connections: Moderately Integrated (03/05/2024)  Stress: Stress Concern Present (01/16/2024)  Tobacco Use: Low Risk  (03/04/2024)    Readmission Risk Interventions    03/19/2024   10:24 AM  Readmission Risk Prevention Plan  Transportation Screening Complete  Medication Review (RN Care Manager) Complete  PCP or Specialist appointment within 3-5  days of discharge Complete  HRI or Home Care Consult Complete  SW Recovery Care/Counseling Consult Complete  Palliative Care Screening Complete  Skilled Nursing Facility Complete

## 2024-04-30 NOTE — Progress Notes (Signed)
 Mobility Specialist: Progress Note   04/30/24 1547  Mobility  Activity Turned to back - supine;Turned to right side  Level of Assistance +2 (takes two people)  Range of Motion/Exercises Active  Activity Response Tolerated well  Mobility Referral Yes  Mobility visit 1 Mobility  Mobility Specialist Start Time (ACUTE ONLY) 1420  Mobility Specialist Stop Time (ACUTE ONLY) 1430  Mobility Specialist Time Calculation (min) (ACUTE ONLY) 10 min    Pt's family requested assistance repositioning pt in bed. MaxA+2. No complaints. Left in supine with all needs met, call bell in reach.   Deloria Fetch Mobility Specialist Please contact via SecureChat or Rehab office at [phone]

## 2024-04-30 NOTE — Progress Notes (Signed)
 Nutrition Follow-up  DOCUMENTATION CODES:   Severe malnutrition in context of chronic illness  INTERVENTION:   Continue to encourage optimal nutritional intake Continue Magic cup TID with meals, each supplement provides 290 kcal and 9 grams of protein Continue Ensure Enlive po BID-One in the Afternoon and one in the evening, each supplement provides 350 kcal and 20 grams of protein. Add vanilla Greek yogurt to evening meal tray  Continue Juven BID to support wound healing Continue MVI with minerals daily  *Pt is following 100% of current interventions and eating 100% of his meals. RD will continue to follow peripherally. If nutrition issues arise please feel free to reconsult us .   NUTRITION DIAGNOSIS:   Severe Malnutrition related to chronic illness as evidenced by energy intake < or equal to 75% for > or equal to 1 month, severe muscle depletion. - Still applicable   GOAL:   Patient will meet greater than or equal to 90% of their needs - Meeting   MONITOR:   PO intake, Supplement acceptance, Weight trends, Skin  REASON FOR ASSESSMENT:   Consult Assessment of nutrition requirement/status  ASSESSMENT:   83 year old male who presented to the ED on 03/04/24 from [facility] due to sacral pain. PMH of T2DM, HTN, HLD, CHF, atrial fibrillation, stroke with right-sided weakness, GERD, BPH, hypothyroidism, MGUS, transthyretin associated amyloid cardiomyopathy, chronic pancytopenia, recent hospital admission for COVID-19. Pt admitted with sepsis secondary to sacral pressure injury infection.  Pt eating lunch on visit, daughter at bedside reports his intake has been poor for the last couple of days due to bloating. PT had ascites and pulmonary edema, pt given lasix  and is feeling better. Appetite improving. Had 100% of breakfast today. For lunch had 100% of the pureed meat and vegetables, took bites of mashed potatoes.  Daughter reports he is drinking 2 Juven, 1.5 Ensures  and 2  Magic cups per day (1,300 kcal 63 gm protein) in addition to his meals. Daughter often uses them to help pt swallow meds.   Daughter inquired why Mighty shakes were stopped. RD explained pt was receiving a lot of supplements and we would like for him to drink the Ensures compared to the Mighty shakes. Daughter agreed. She also asked if pt is getting enough fiber with his diet. RD explained he is eating 100% of the pureed vegetables and should be receiving enough, RD asked if she wanted to add a fiber supplements, daughter denied at this time.   Daughter also inquired if pt is receiving enough protein because pt's albumin  is low. RD explained that albumin  is likely skewed due to the acute inflammatory response, not diet alone. Daughter expressed understanding.   Of note pt with hypoglycemic episode yesterday evening. Daughter would like to add an Ensure in the evening and a greek yogurt to his meal trays to help manage blood sugars.      Admit weight: 68.5 kg Current weight: 70.3 kg    Average Meal Intake: 5/25-6/8: 75% intake x 8 recorded meals  Nutritionally Relevant Medications: Scheduled Meds:  feeding supplement  237 mL Oral BID BM   ferrous sulfate   325 mg Oral QHS   furosemide   20 mg Oral Daily   guaiFENesin   600 mg Oral BID   levothyroxine   50 mcg Oral Q0600   liver oil-zinc  oxide   Topical BID   multivitamin with minerals  1 tablet Oral Daily   nutrition supplement (JUVEN)  1 packet Oral BID BM   mouth rinse  15 mL  Mouth Rinse 4 times per day   oxyCODONE   10 mg Oral QHS   phosphorus  500 mg Oral TID   polyethylene glycol  17 g Oral BID   senna-docusate  1 tablet Oral QHS   Continuous Infusions:  cefTAZidime (FORTAZ)  IV 2 g (04/30/24 1328)   dextrose  50 mL/hr at 04/30/24 0349   Labs Reviewed: Albumin  2.1 Total protein 5.9 BUN 29 Vitamin A  7.9 (02/2024) CBG ranges from 97-239 mg/dL over the last 24 hours HgbA1c 8.4  Diet Order:   Diet Order             DIET - DYS  1 Room service appropriate? No; Fluid consistency: Thin  Diet effective now                   EDUCATION NEEDS:   No education needs have been identified at this time  Skin:  Skin Assessment: Skin Integrity Issues: Skin Integrity Issues:: Stage II, DTI, Unstageable, Other (Comment) DTI: L shoulder, great toe Stage II: L hip Unstageable: sacrum Other: wound to R heel, skin tear to L elbow  Last BM:  6/10  Height:   Ht Readings from Last 1 Encounters:  03/04/24 5\' 11"  (1.803 m)    Weight:   Wt Readings from Last 1 Encounters:  04/30/24 70.3 kg    Ideal Body Weight:     BMI:  Body mass index is 21.62 kg/m.  Estimated Nutritional Needs:   Kcal:  2000-2200  Protein:  95-100 grams  Fluid:  >2.0 L    Earl [name], RD Registered Dietitian  See Amion for more information

## 2024-04-30 NOTE — Progress Notes (Signed)
 Progress Note   Patient: Earl [name] XBJ:[id number] DOB: January 29, 1941 DOA: 03/04/2024     57 DOS: the patient was seen and examined on 04/30/2024   Brief hospital course: 83 yrs old male with past medical history of paroxysmal atrial fibrillation on Eliquis , insulin -dependent diabetes mellitus type 2, essential hypertension, CVA with right residual weakness, MGUS, BPH, HFr EF of 40%, He was discharged, recently from [facility] after admitted for acute metabolic encephalopathy, rhabdomyolysis and orthostatic, chronic pancytopenia presents to the ED from skilled nursing facility for shortness of breath, sacral pain, In the ED, he was found to be in RVR, tachypneic with saturations greater than 90%. Critical care was consulted for persistent hypotension. Respiratory panel was negative. General surgery was consulted for sacral wound debridement with bedside excision on 03/05/2024 and 03/07/2024. He is now hemodynamically stable for discharge.    6/4: Some concern of increasing discharge and some order from wound so wound care was reconsulted-repeat wound cultures were sent.  Daughter concerned of some shortness of breath which was not observed during rounds, chest x-ray with mild bibasilar subsegmental atelectasis or edema with small pleural effusions.  Remained on room air.   6/5:Hemodynamically stable.  Repeat cultures from wound yesterday with no organism.  6/6 - he is lying in bed, weak, does not wish to go to any facility.   6/7: Wound cultures 6/4 back with pseudomonas, klebsiella.   6/8: daughter at bedside, wishes he can be taken out of room for his birthday.   6/9- Seems more short of breath, feels dizzy, lightheaded. Daughter at bedside concerned about his abdominal distension, discomfort. Also has bilateral thigh swelling.  Assessment and Plan: Severe sepsis  On presentation, lactic acidosis, leukocytosis, fever, secondary to infected chronic wound.  Blood cultures drawn, empiric IV  fluid bolus, empiric antibiotics.  Sepsis now resolved.   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  Completed antibiotics 5/1.  General surgery now signed off.  Wound care following.  Infectious disease signed off.  Continue wound care management/regimen.  Offload pressure with every 2 hours turns, adequate nutrition.  Continues showing improvement.    Cleanse with Vashe#[id number]. Apply Aquacel Ag+ (silver hydrofiber) Lawson # [id number] in the wound bed and fill the tunneling using a swab (change daily), cover with sacral foam dressing, change daily and PRN soilage Repeat cultures were sent on 6/4 grew klebsiella, pseudomonas. Sensitivities reviewed. continue IV ceftazidime x 7days as per pharmacy protocol.   Other pressure injuries Continue wound care, off loading, as ordered.   Challenging as he is bed bound. Air mattress, frequent positioning for off loading.   Acute on chronic pain - secondary to above Continue OxyContin  10mg  at bedtime, with oxycodone  2.5mg  q4hr prn.  Plan to wean off opioids entirely by discharge.  He was in pain last night required PRN med.   Paroxysmal atrial fibrillation Continue amiodarone  and Eliquis .    HFrEF/cardiac amyloidosis/NICM On tafamadis for amyloid.  GDMT not in place secondary to hypotension.  Continue oral Lasix  20 mg daily.  Will give IV Lasix  as needed to relieve symptoms (sob, leg edema). Cardiology did follow him yesterday per daughter request- he has end stage CHF and prognosis is poor. He could not tolerate GDMT. Daughter aware of his poor condition.   Insulin -dependent diabetes mellitus Blood sugars low last night. He ate poor yesterday due to being in pain for radiology study. Started on D5 slow hydration.  Accuchecks for hypoglycemia monitoring. Hold insulin  regimen for  now until his sugars persistently high.   Anemia of chronic disease S/p 1 unit PRBC 4/30.  Hemoglobin stable, no active  bleeding.   Dysphagia/severe protein calorie malnutrition Continue 3 times daily protein calorie supplementation.  Vitamin supplementation.  Continues on pured diet.  Daughter noted that he has improvement in appetite.     Physical debilitation of muscle weakness PT/ OT on board.  However, limited ability to sit upright in chair secondary to pain from sacral ulcer.   Continue frequent repositioning.   Goals of care Patient is medically appropriate for discharge at this time. He is DNR-limited intervention. Working closely with TOC family on disposition planning. Patient refuses to go to any facility and wishes to go home.   Daughter wants to make sure wounds are better before transition out of the hospital.  However given his debility, poor prognosis, SNF for full time care would be appropriate. TOC working with admin regarding safe disposition. I called palliative consult for follow up on goals of care.  Nutrition Documentation    Flowsheet Row ED to Hosp-Admission (Current) from 03/04/2024 in [place] [facility]  Nutrition Problem Severe Malnutrition  Etiology chronic illness  Nutrition Goal Patient will meet greater than or equal to 90% of their needs  Interventions Ensure Enlive (each supplement provides 350kcal and 20 grams of protein), Magic cup, Hormel Shake, MVI, Juven, Other (Comment)  [vitamin C ]     ,  Active Pressure Injury/Wound(s)     Pressure Ulcer  Duration          Pressure Injury 02/26/24 Coccyx Mid Stage 4 - Full thickness tissue loss with exposed bone, tendon or muscle. 63 days   Pressure Injury 02/26/24 Toe (Comment  which one) Anterior;Right Deep Tissue Pressure Injury - Purple or maroon localized area of discolored intact skin or blood-filled blister due to damage of underlying soft tissue from pressure and/or shear. right gre 63 days   Pressure Injury 03/06/24 Hip Anterior;Left;Proximal Unstageable - Full thickness tissue loss in which the base of the  injury is covered by slough (yellow, tan, gray, green or brown) and/or eschar (tan, brown or black) in the wound bed. unstageable 53 days   Pressure Injury 03/06/24 Shoulder Left Unstageable - Full thickness tissue loss in which the base of the injury is covered by slough (yellow, tan, gray, green or brown) and/or eschar (tan, brown or black) in the wound bed. unstageable 53 days   Pressure Injury 04/23/24 Heel Right Unstageable - Full thickness tissue loss in which the base of the injury is covered by slough (yellow, tan, gray, green or brown) and/or eschar (tan, brown or black) in the wound bed. DTPI evolving into unstageable 6 days           (Optional):26781}   Out of bed to chair. Incentive spirometry. Nursing supportive care. Fall, aspiration precautions. Diet:  Diet Orders (From admission, onward)     Start     Ordered   03/17/24 1054  DIET - DYS 1 Room service appropriate? Yes; Fluid consistency: Thin  Diet effective now       Question Answer Comment  Room service appropriate? Yes   Fluid consistency: Thin      03/17/24 1053           DVT prophylaxis: SCDs Start: 03/04/24 2151 Place TED hose Start: 03/04/24 2151 apixaban  (ELIQUIS ) tablet 5 mg  Level of care: Telemetry Medical   Code Status: Limited: Do not attempt resuscitation (DNR) -DNR-LIMITED -Do Not  Intubate/DNI   Subjective: Patient is seen and examined today morning. He is lethargic, able to answer me slowly. Sugars low since yesterday as he did not eat well. Daughter at bedside, discussed poor prognosis.   Physical Exam: Vitals:   04/30/24 0444 04/30/24 0500 04/30/24 0719 04/30/24 1220  BP: 117/66  (!) 118/57   Pulse: (!) 59  (!) 59   Resp: 18  18   Temp: (!) 97.5 F (36.4 C)  97.8 F (36.6 C)   TempSrc:      SpO2: 100%  100% 93%  Weight:  70.3 kg    Height:        General - Elderly ill African American male, mild respiratory distress HEENT - PERRLA, EOMI, atraumatic head, non tender sinuses. Lung  - Clear, basal rales, rhonchi, wheezes. Heart - S1, S2 heard, no murmurs, rubs, b/l thigh, abdomen swelling. Abdomen - Soft, non tender, bowel sounds good Neuro - Alert, awake and oriented, non focal exam. Skin - Warm and dry. Left hip, back, heel dressings noted.  Data Reviewed:      Latest Ref Rng & Units 04/28/2024    8:21 AM 04/24/2024    5:58 AM 04/19/2024    8:52 AM  CBC  WBC 4.0 - 10.5 K/uL 5.2  6.0  6.1   Hemoglobin 13.0 - 17.0 g/dL 40.9  81.1  91.4   Hematocrit 39.0 - 52.0 % 33.2  32.2  32.0   Platelets 150 - 400 K/uL 292  307  310       Latest Ref Rng & Units 04/28/2024    8:21 AM 04/24/2024    5:58 AM 04/19/2024    8:52 AM  BMP  Glucose 70 - 99 mg/dL 782  91  956   BUN 8 - 23 mg/dL 29  30  30    Creatinine 0.61 - 1.24 mg/dL 2.13  0.86  5.78   Sodium 135 - 145 mmol/L 141  141  139   Potassium 3.5 - 5.1 mmol/L 3.5  3.6  4.0   Chloride 98 - 111 mmol/L 106  111  108   CO2 22 - 32 mmol/L 23  20  24    Calcium  8.9 - 10.3 mg/dL 8.2  8.4  8.4    CT HEAD WO CONTRAST ( ) Result Date: 04/30/2024 CLINICAL DATA:  Initial evaluation for acute neuro deficit, stroke suspected. EXAM: CT HEAD WITHOUT CONTRAST TECHNIQUE: Contiguous axial images were obtained from the base of the skull through the vertex without intravenous contrast. RADIATION DOSE REDUCTION: This exam was performed according to the departmental dose-optimization program which includes automated exposure control, adjustment of the mA and/or kV according to patient size and/or use of iterative reconstruction technique. COMPARISON:  CT from 02/22/2024. FINDINGS: Brain: Generalized age-related cerebral atrophy with mild chronic microvascular ischemic disease. Chronic right cerebellar infarct noted. No acute intracranial hemorrhage. No acute large vessel territory infarct. No mass lesion or midline shift. No hydrocephalus or extra-axial fluid collection. Vascular: No abnormal hyperdense vessel. Calcified atherosclerosis present at the  skull base. Skull: Scalp soft tissues demonstrate no acute finding. Calvarium intact. 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 Globes orbital soft tissues demonstrate no other acute finding. Paranasal sinuses are largely clear. Trace right mastoid effusion noted, of doubtful significance. Other: None. IMPRESSION: 1. No acute intracranial abnormality. 2. Generalized age-related cerebral atrophy with mild chronic small vessel ischemic disease. Chronic right cerebellar infarct. Electronically Signed   By: Amye Baller  Tresea Frost M.D.   On: 04/30/2024 01:54    Family Communication: Discussed with patient, daughter at bedside. They understand and agree. All questions answered.  Disposition: Status is: Inpatient Remains inpatient appropriate because: family discussion, placement, wound healing, palliative discussion.  Planned Discharge Destination: Rehab     Time spent: 40 minutes  Author: Aisha Hove, MD 04/30/2024 1:47 PM Secure chat 7am to 7pm For on call review [email or web address].

## 2024-04-30 NOTE — Progress Notes (Signed)
 Hypoglycemic Event 1927  CBG: 35 at 1927 and then 21 at 1956  Treatment: D50 50 mL (25 gm) unable to give until IV access was obtained at 1952  Symptoms: Sweaty, Shaky, and crying. Unable to communicate needs.  Follow-up CBG: Time: 2011 CBG Result:109  Possible Reasons for Event: Inadequate meal intake  Comments/MD notified: Patient received a total of 50 g of dextrose  50%. Patient was also started on Dextrose  10% continuous infusing running at 50 mL/hr.   Melford Spotted, MD notified and was at bedside around 2011. Daughter at bedside and updated on patient condition and current interventions.     Hypoglycemic Event 2117  CBG: 56  Treatment: 4 oz juice/soda and D50 50 mL (25 gm)  Symptoms: None  Follow-up CBG: Time:2213 CBG Result:136  Possible Reasons for Event: Unknown  Comments/MD notified: Patient remains on Dextrose  10% continuous infusing running at 50 mL/hr. Daughter is encouraging patient to drink juice and ensure to improve CBG.     Brant Caldron

## 2024-04-30 NOTE — Plan of Care (Signed)
  Problem: Fluid Volume: Goal: Hemodynamic stability will improve Outcome: Progressing   Problem: Clinical Measurements: Goal: Diagnostic test results will improve Outcome: Progressing Goal: Signs and symptoms of infection will decrease Outcome: Progressing   Problem: Respiratory: Goal: Ability to maintain adequate ventilation will improve Outcome: Progressing   Problem: Education: Goal: Knowledge of risk factors and measures for prevention of condition will improve Outcome: Progressing   Problem: Coping: Goal: Psychosocial and spiritual needs will be supported Outcome: Progressing   Problem: Respiratory: Goal: Will maintain a patent airway Outcome: Progressing Goal: Complications related to the disease process, condition or treatment will be avoided or minimized Outcome: Progressing   Problem: Education: Goal: Ability to describe self-care measures that may prevent or decrease complications (Diabetes Survival Skills Education) will improve Outcome: Progressing   Problem: Coping: Goal: Ability to adjust to condition or change in health will improve Outcome: Progressing   Problem: Fluid Volume: Goal: Ability to maintain a balanced intake and output will improve Outcome: Progressing   Problem: Health Behavior/Discharge Planning: Goal: Ability to manage health-related needs will improve Outcome: Progressing   Problem: Metabolic: Goal: Ability to maintain appropriate glucose levels will improve Outcome: Progressing   Problem: Nutritional: Goal: Maintenance of adequate nutrition will improve Outcome: Progressing Goal: Progress toward achieving an optimal weight will improve Outcome: Progressing   Problem: Skin Integrity: Goal: Risk for impaired skin integrity will decrease Outcome: Progressing   Problem: Tissue Perfusion: Goal: Adequacy of tissue perfusion will improve Outcome: Progressing   Problem: Education: Goal: Knowledge of General Education information  will improve Description: Including pain rating scale, medication(s)/side effects and non-pharmacologic comfort measures Outcome: Progressing   Problem: Health Behavior/Discharge Planning: Goal: Ability to manage health-related needs will improve Outcome: Progressing   Problem: Clinical Measurements: Goal: Ability to maintain clinical measurements within normal limits will improve Outcome: Progressing Goal: Will remain free from infection Outcome: Progressing Goal: Diagnostic test results will improve Outcome: Progressing Goal: Respiratory complications will improve Outcome: Progressing Goal: Cardiovascular complication will be avoided Outcome: Progressing   Problem: Activity: Goal: Risk for activity intolerance will decrease Outcome: Progressing   Problem: Nutrition: Goal: Adequate nutrition will be maintained Outcome: Progressing   Problem: Coping: Goal: Level of anxiety will decrease Outcome: Progressing   Problem: Elimination: Goal: Will not experience complications related to bowel motility Outcome: Progressing Goal: Will not experience complications related to urinary retention Outcome: Progressing   Problem: Pain Managment: Goal: General experience of comfort will improve and/or be controlled Outcome: Progressing   Problem: Safety: Goal: Ability to remain free from injury will improve Outcome: Progressing   Problem: Skin Integrity: Goal: Risk for impaired skin integrity will decrease Outcome: Progressing

## 2024-04-30 NOTE — Consult Note (Addendum)
 WOC Nurse wound follow up Wound type: Left hip: unable to visualize during visit, patient eating and request to see another day Sacral: unable to visualize during visit, patient eating and request to see another day Left shoulder:unstageable  R heel unstageable L heel DTPI Measurement: Left hip: unable to visualize during visit, patient eating and request to see another day Sacral: unable to visualize during visit, patient eating and request to see another day Left shoulder:unstageable 3cm x 2 cm  R heel unstageable 4 cm x 4cm  L heel DTPI Wound bed: Left hip: unable to visualize during visit, patient eating and request to see another day Sacral: unable to visualize during visit, patient eating and request to see another day Left shoulder:unstageable 3cm x 2 cm  R heel unstageable 4 cm x 4cm, eschar is lifting, pink moist tissue visible in the perimeter of wound with slough/eschar covering lower aspect of wound  L heel DTPI- purple maroon discoloration Drainage (amount, consistency, odor) scant to none  Periwound: intact Dressing procedure/placement/frequency: Will continue current orders with no change at this time, plan to re-evaluate sacral and him wounds tomorrow with patient permission.  Daughter was present at bedside during visit and plan of care was discussed with her.  Gillermo Lack, RN, MSN, [facility] WOC Team

## 2024-05-01 DIAGNOSIS — A419 Sepsis, unspecified organism: Secondary | ICD-10-CM | POA: Diagnosis not present

## 2024-05-01 DIAGNOSIS — L98429 Non-pressure chronic ulcer of back with unspecified severity: Secondary | ICD-10-CM | POA: Diagnosis not present

## 2024-05-01 DIAGNOSIS — E854 Organ-limited amyloidosis: Secondary | ICD-10-CM | POA: Diagnosis not present

## 2024-05-01 DIAGNOSIS — E8582 Wild-type transthyretin-related (ATTR) amyloidosis: Secondary | ICD-10-CM

## 2024-05-01 DIAGNOSIS — I4891 Unspecified atrial fibrillation: Secondary | ICD-10-CM | POA: Diagnosis not present

## 2024-05-01 LAB — BASIC METABOLIC PANEL WITH GFR
Anion gap: 9 (ref 5–15)
BUN: 35 mg/dL — ABNORMAL HIGH (ref 8–23)
CO2: 26 mmol/L (ref 22–32)
Calcium: 8.1 mg/dL — ABNORMAL LOW (ref 8.9–10.3)
Chloride: 104 mmol/L (ref 98–111)
Creatinine, Ser: 1.04 mg/dL (ref 0.61–1.24)
GFR, Estimated: >60 mL/min (ref >60)
Glucose, Bld: 253 mg/dL — ABNORMAL HIGH (ref 70–99)
Potassium: 3.6 mmol/L (ref 3.5–5.1)
Sodium: 139 mmol/L (ref 135–145)

## 2024-05-01 LAB — CBC
HCT: 30.7 % — ABNORMAL LOW (ref 39.0–52.0)
Hemoglobin: 9.8 g/dL — ABNORMAL LOW (ref 13.0–17.0)
MCH: 28.2 pg (ref 26.0–34.0)
MCHC: 31.9 g/dL (ref 30.0–36.0)
MCV: 88.2 fL (ref 80.0–100.0)
Platelets: 280 10*3/uL (ref 150–400)
RBC: 3.48 MIL/uL — ABNORMAL LOW (ref 4.22–5.81)
RDW: 17.2 % — ABNORMAL HIGH (ref 11.5–15.5)
WBC: 5.6 10*3/uL (ref 4.0–10.5)
nRBC: 0 % (ref 0.0–0.2)

## 2024-05-01 LAB — GLUCOSE, CAPILLARY
Glucose-Capillary: 231 mg/dL — ABNORMAL HIGH (ref 70–99)
Glucose-Capillary: 231 mg/dL — ABNORMAL HIGH (ref 70–99)
Glucose-Capillary: 284 mg/dL — ABNORMAL HIGH (ref 70–99)
Glucose-Capillary: 246 mg/dL — ABNORMAL HIGH (ref 70–99)
Glucose-Capillary: 184 mg/dL — ABNORMAL HIGH (ref 70–99)
Glucose-Capillary: 170 mg/dL — ABNORMAL HIGH (ref 70–99)

## 2024-05-01 MED ORDER — INSULIN GLARGINE-YFGN 100 UNIT/ML ~~LOC~~ SOLN
8.0000 [IU] | Freq: Every day | SUBCUTANEOUS | Status: DC
  Administered 2024-05-01 – 2024-05-26 (×25): 8 [IU] via SUBCUTANEOUS
  Filled 2024-05-01 (×27): qty 0.08

## 2024-05-01 MED ORDER — FUROSEMIDE 10 MG/ML IJ SOLN
20.0000 mg | Freq: Once | INTRAMUSCULAR | Status: AC
  Administered 2024-05-01: 20 mg via INTRAVENOUS
  Filled 2024-05-01: qty 2

## 2024-05-01 MED ORDER — INSULIN ASPART 100 UNIT/ML IJ SOLN
0.0000 [IU] | Freq: Three times a day (TID) | INTRAMUSCULAR | Status: DC
  Administered 2024-05-01 (×2): 3 [IU] via SUBCUTANEOUS
  Administered 2024-05-02 – 2024-05-04 (×3): 2 [IU] via SUBCUTANEOUS
  Administered 2024-05-04: 1 [IU] via SUBCUTANEOUS
  Administered 2024-05-05: 3 [IU] via SUBCUTANEOUS
  Administered 2024-05-05 – 2024-05-06 (×3): 2 [IU] via SUBCUTANEOUS
  Administered 2024-05-06: 1 [IU] via SUBCUTANEOUS
  Administered 2024-05-06: 2 [IU] via SUBCUTANEOUS
  Administered 2024-05-07 (×3): 3 [IU] via SUBCUTANEOUS
  Administered 2024-05-08: 2 [IU] via SUBCUTANEOUS
  Administered 2024-05-08: 1 [IU] via SUBCUTANEOUS
  Administered 2024-05-08: 5 [IU] via SUBCUTANEOUS
  Administered 2024-05-09 – 2024-05-10 (×4): 1 [IU] via SUBCUTANEOUS
  Administered 2024-05-10 (×2): 5 [IU] via SUBCUTANEOUS
  Administered 2024-05-11 – 2024-05-12 (×4): 2 [IU] via SUBCUTANEOUS
  Administered 2024-05-12: 1 [IU] via SUBCUTANEOUS
  Administered 2024-05-13 – 2024-05-14 (×4): 2 [IU] via SUBCUTANEOUS
  Administered 2024-05-14 – 2024-05-15 (×4): 1 [IU] via SUBCUTANEOUS
  Administered 2024-05-15: 2 [IU] via SUBCUTANEOUS
  Administered 2024-05-16: 1 [IU] via SUBCUTANEOUS
  Administered 2024-05-16: 3 [IU] via SUBCUTANEOUS
  Administered 2024-05-16 – 2024-05-19 (×6): 2 [IU] via SUBCUTANEOUS
  Administered 2024-05-19 – 2024-05-20 (×2): 3 [IU] via SUBCUTANEOUS
  Administered 2024-05-20: 1 [IU] via SUBCUTANEOUS
  Administered 2024-05-21: 3 [IU] via SUBCUTANEOUS
  Administered 2024-05-22 – 2024-05-24 (×6): 2 [IU] via SUBCUTANEOUS
  Administered 2024-05-25: 3 [IU] via SUBCUTANEOUS
  Administered 2024-05-25 (×2): 1 [IU] via SUBCUTANEOUS
  Administered 2024-05-26 (×2): 2 [IU] via SUBCUTANEOUS

## 2024-05-01 MED ORDER — INSULIN ASPART 100 UNIT/ML IJ SOLN
0.0000 [IU] | Freq: Every day | INTRAMUSCULAR | Status: DC
  Administered 2024-05-05: 3 [IU] via SUBCUTANEOUS
  Administered 2024-05-07: 2 [IU] via SUBCUTANEOUS
  Administered 2024-05-11: 3 [IU] via SUBCUTANEOUS

## 2024-05-01 NOTE — Plan of Care (Signed)
  Problem: Fluid Volume: Goal: Hemodynamic stability will improve Outcome: Progressing   Problem: Clinical Measurements: Goal: Diagnostic test results will improve Outcome: Progressing Goal: Signs and symptoms of infection will decrease Outcome: Progressing   Problem: Respiratory: Goal: Ability to maintain adequate ventilation will improve Outcome: Progressing   Problem: Education: Goal: Knowledge of risk factors and measures for prevention of condition will improve Outcome: Progressing   Problem: Coping: Goal: Psychosocial and spiritual needs will be supported Outcome: Progressing   Problem: Respiratory: Goal: Will maintain a patent airway Outcome: Progressing Goal: Complications related to the disease process, condition or treatment will be avoided or minimized Outcome: Progressing   Problem: Education: Goal: Ability to describe self-care measures that may prevent or decrease complications (Diabetes Survival Skills Education) will improve Outcome: Progressing   Problem: Coping: Goal: Ability to adjust to condition or change in health will improve Outcome: Progressing   Problem: Fluid Volume: Goal: Ability to maintain a balanced intake and output will improve Outcome: Progressing   Problem: Health Behavior/Discharge Planning: Goal: Ability to manage health-related needs will improve Outcome: Progressing   Problem: Metabolic: Goal: Ability to maintain appropriate glucose levels will improve Outcome: Progressing   Problem: Nutritional: Goal: Maintenance of adequate nutrition will improve Outcome: Progressing Goal: Progress toward achieving an optimal weight will improve Outcome: Progressing   Problem: Skin Integrity: Goal: Risk for impaired skin integrity will decrease Outcome: Progressing   Problem: Tissue Perfusion: Goal: Adequacy of tissue perfusion will improve Outcome: Progressing   Problem: Education: Goal: Knowledge of General Education information  will improve Description: Including pain rating scale, medication(s)/side effects and non-pharmacologic comfort measures Outcome: Progressing   Problem: Health Behavior/Discharge Planning: Goal: Ability to manage health-related needs will improve Outcome: Progressing   Problem: Clinical Measurements: Goal: Ability to maintain clinical measurements within normal limits will improve Outcome: Progressing Goal: Will remain free from infection Outcome: Progressing Goal: Diagnostic test results will improve Outcome: Progressing Goal: Respiratory complications will improve Outcome: Progressing Goal: Cardiovascular complication will be avoided Outcome: Progressing   Problem: Activity: Goal: Risk for activity intolerance will decrease Outcome: Progressing   Problem: Nutrition: Goal: Adequate nutrition will be maintained Outcome: Progressing   Problem: Coping: Goal: Level of anxiety will decrease Outcome: Progressing   Problem: Elimination: Goal: Will not experience complications related to bowel motility Outcome: Progressing Goal: Will not experience complications related to urinary retention Outcome: Progressing   Problem: Pain Managment: Goal: General experience of comfort will improve and/or be controlled Outcome: Progressing   Problem: Safety: Goal: Ability to remain free from injury will improve Outcome: Progressing   Problem: Skin Integrity: Goal: Risk for impaired skin integrity will decrease Outcome: Progressing

## 2024-05-01 NOTE — Consult Note (Addendum)
 WOC Nurse wound follow up Wound type: Left hip: unstageable  Sacral: stage 4 Left shoulder:unstageable ( see note from 6/10) R heel unstageable ( see note from 6/10) L heel DTPI ( see note from 6/10) Measurement: Left hip: 6 cm x 4 cm Sacral: 6cm x 3 cm x 0.5 cm with undermining from 12 o'clock to 3 o'clock measuring 3 cm  Left shoulder: ( see note from 6/10) R heel  ( see note from 6/10) L heel ( see note from 6/10 Wound bed: Left hip: 90 % wound bed covered with slough, which is softening however edges remain attached 10% pink clean tissue ( surrounding the perimeter of slough) Sacrum: 100% clean red moist tissue in wound bed Left shoulder: ( see note from 6/10) R heel  ( see note from 6/10) L heel ( see note from 6/10 Drainage (amount, consistency, odor)  Left hip: scant purulent Sacrum: minimal  purulent  Left shoulder: ( see note from 6/10) R heel  ( see note from 6/10) L heel ( see note from 6/10) Periwound: intact Dressing procedure/placement/frequency: Will continue current orders with no change at this time.  Daughter was not present at bedside during visit, I have outreached her by phone( secure VM left) to discuss visit and plan of care.  Gillermo Lack, RN, MSN, [facility] WOC Team

## 2024-05-01 NOTE — Progress Notes (Signed)
 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 Critical care was consulted for persistent hypotension. Respiratory panel was negative. General surgery was consulted for sacral wound debridement with bedside excision on 03/05/2024 and 03/07/2024. He is now hemodynamically stable for discharge.    6/4: Some concern of increasing discharge and some order from wound so wound care was reconsulted-repeat wound cultures were sent.  Daughter concerned of some shortness of breath which was not observed during rounds, chest x-ray with mild bibasilar subsegmental atelectasis or edema with small pleural effusions.  Remained on room air.   6/5:Hemodynamically stable.  Repeat cultures from wound yesterday with no organism.  6/6 - he is lying in bed, weak, does not wish to go to any facility.   6/7: Wound cultures 6/4 back with pseudomonas, klebsiella.   6/8: daughter at bedside, wishes he can be taken out of room for his birthday.   6/9- Seems more short of breath, feels dizzy, lightheaded. Daughter at bedside concerned about his abdominal distension, discomfort. Also has bilateral thigh swelling.  6/10 - he felt better, blood sugars low overnight, insulin  stopped, started d5.  6/11 - has shortness of breath, swallow difficulty. Has good bowel movement.  Palliative involved.   Assessment and Plan: Severe sepsis - resolved On presentation, lactic acidosis, leukocytosis, fever, secondary to infected chronic wound.  Patient did receive multiple antibiotics during long hospital stay.  Sepsis now resolved.   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  Completed antibiotics 5/1.  General surgery now signed off.  Wound care following.  Infectious disease signed off.  Continue wound care management/regimen.  Offload pressure with every 2 hours turns, adequate nutrition.    Cleanse with Vashe#[id number]. Apply Aquacel Ag+ (silver hydrofiber) Lawson # [id number] in the wound bed and fill the tunneling using a swab (change daily), cover with sacral foam dressing, change daily and PRN soilage Repeat cultures were sent on 6/4 grew klebsiella, pseudomonas. Sensitivities reviewed. continue IV ceftazidime x 7days as per pharmacy protocol.   Other pressure injuries Continue wound care, off loading, as ordered.   Challenging as he is bed bound. Air mattress, frequent positioning for off loading. WOC on board  Acute on chronic pain - secondary to above Continue OxyContin  10mg  at bedtime, with oxycodone  2.5mg  q4hr prn.  Plan to wean off opioids entirely by discharge. On and off severe pain requiring PRN meds.   Paroxysmal atrial fibrillation Continue amiodarone  and Eliquis .    HFrEF/cardiac amyloidosis/NICM On tafamadis for amyloid.  GDMT not in place secondary to hypotension.  Continue oral Lasix  20 mg daily.  Continue IV Lasix  as needed to relieve symptoms (sob, abdominal wall, thigh edema). Cardiology did follow him 6/9 discussed with daughter- he has end stage CHF and prognosis is poor. He could not tolerate GDMT. Daughter aware of his poor condition, prognosis. Palliative on board for pain control and  goals of care discussions.   Insulin -dependent diabetes mellitus Blood sugars higher side today, stopped  d5. Resumed semglee  8 units, accucheks, sliding scale insulin . Monitor for hypoglycemia  Anemia of chronic disease S/p 1 unit PRBC 4/30.  Hemoglobin stable, no active bleeding.   Dysphagia/severe protein calorie malnutrition Continue 3 times daily protein calorie supplementation.  Vitamin supplementation.  Continues on pured diet.  Daughter noted that he has improvement in appetite.     Physical debilitation of muscle weakness PT/ OT working with him.  However, limited ability to sit upright in chair secondary to pain from sacral ulcer.   Continue frequent repositioning.   Goals of care Patient is medically appropriate for discharge at this time. He is DNR-limited intervention. His overall prognosis is poor. Patient would like to continue current treatment, refuses to go to any facility and wishes to go home. Daughter wishes transition to home care once his wounds are better.  However given his debility, poor prognosis, SNF for full time care would be appropriate.  TOC working with admin regarding safe disposition. Palliative follow up on goals of care, pain control.  Nutrition Documentation    Flowsheet Row ED to Hosp-Admission (Current) from 03/04/2024 in [place] [facility]  Nutrition Problem Severe Malnutrition  Etiology chronic illness  Nutrition Goal Patient will meet greater than or equal to 90% of their needs  Interventions Ensure Enlive (each supplement provides 350kcal and 20 grams of protein), Magic cup, Hormel Shake, MVI, Juven, Other (Comment)  [vitamin C ]     ,  Active Pressure Injury/Wound(s)     Pressure Ulcer  Duration          Pressure Injury 02/26/24 Coccyx Mid Stage 4 - Full thickness tissue loss with exposed bone, tendon or muscle. 63 days   Pressure Injury 02/26/24 Toe (Comment  which one) Anterior;Right Deep Tissue Pressure Injury - Purple or maroon localized area of discolored intact skin or blood-filled blister due to damage of underlying soft  tissue from pressure and/or shear. right gre 63 days   Pressure Injury 03/06/24 Hip Anterior;Left;Proximal Unstageable - Full thickness tissue loss in which the base of the injury is covered by slough (yellow, tan, gray, green or brown) and/or eschar (tan, brown or black) in the wound bed. unstageable 53 days   Pressure Injury 03/06/24 Shoulder Left Unstageable - Full thickness tissue loss in which the base of the injury is covered by slough (yellow, tan, gray, green or brown) and/or eschar (tan, brown or black) in the wound bed. unstageable 53 days   Pressure Injury 04/23/24 Heel Right Unstageable - Full thickness tissue loss in which the base of the injury is covered by slough (yellow, tan, gray, green or brown) and/or eschar (tan, brown or black) in the wound bed. DTPI evolving into unstageable 6 days           (Optional):[id number]}   Out of bed to chair. Incentive spirometry. Nursing supportive care. Fall, aspiration precautions. Diet:  Diet Orders (From admission, onward)     Start     Ordered   04/30/24 1436  DIET - DYS 1 Room service appropriate? No; Fluid consistency: Thin  Diet effective now       Question Answer Comment  Room service appropriate? No   Fluid consistency: Thin      04/30/24 1436           DVT prophylaxis: SCDs Start: 03/04/24 2151 Place TED hose Start: 03/04/24 2151 apixaban  (ELIQUIS ) tablet 5 mg  Level of care: Telemetry Medical   Code Status: Limited: Do not attempt resuscitation (DNR) -DNR-LIMITED -Do Not Intubate/DNI   Subjective: Patient is seen and examined today morning. He is shirt of breath, sugars high, fluids stopped. He is eating fair. RN noted, daughter at bedside asked for SLP eval given difficulty swallowing.   Physical Exam: Vitals:   05/01/24 0409 05/01/24 0556 05/01/24 0729 05/01/24 1452  BP: 117/64  130/65 128/71  Pulse: 77  67 74  Resp: 20  20 20   Temp: 97.7 F (36.5 C)  98.4 F (36.9 C) 97.8 F (36.6 C)  TempSrc: Oral      SpO2: 99%  100% 99%  Weight:  85.7 kg    Height:        General - Elderly ill African American male, mild respiratory distress HEENT - PERRLA, EOMI, atraumatic head, non tender sinuses. Lung - Clear, basal rales, rhonchi, wheezes. Heart - S1, S2 heard, no murmurs, rubs, b/l thigh swelling. Abdomen - Soft, non tender, distended, bowel sounds good Neuro - Alert, awake and oriented, non focal exam. Skin - Warm and dry. Left hip, back, heel dressings noted.  Data Reviewed:      Latest Ref Rng & Units 05/01/2024    3:38 AM 04/28/2024    8:21 AM 04/24/2024    5:58 AM  CBC  WBC 4.0 - 10.5 K/uL 5.6  5.2  6.0   Hemoglobin 13.0 - 17.0 g/dL 9.8  40.3  47.4   Hematocrit 39.0 - 52.0 % 30.7  33.2  32.2   Platelets 150 - 400 K/uL 280  292  307       Latest Ref Rng & Units 05/01/2024    3:38 AM 04/28/2024    8:21 AM 04/24/2024    5:58 AM  BMP  Glucose 70 - 99 mg/dL 259  563  91   BUN 8 - 23 mg/dL 35  29  30   Creatinine 0.61 - 1.24 mg/dL 8.75  6.43  3.29   Sodium 135 - 145 mmol/L 139  141  141   Potassium 3.5 - 5.1 mmol/L 3.6  3.5  3.6   Chloride 98 - 111 mmol/L 104  106  111   CO2 22 - 32 mmol/L 26  23  20    Calcium  8.9 - 10.3 mg/dL 8.1  8.2  8.4    CT HEAD WO CONTRAST ( ) Result Date: 04/30/2024 CLINICAL DATA:  Initial evaluation for acute neuro deficit, stroke suspected. EXAM: CT HEAD WITHOUT CONTRAST TECHNIQUE: Contiguous axial images were obtained from the base of the skull through the vertex without intravenous contrast. RADIATION DOSE REDUCTION: This exam was performed according to the departmental dose-optimization program which includes automated exposure control, adjustment of the mA and/or kV according to patient size and/or use of iterative reconstruction technique. COMPARISON:  CT from 02/22/2024. FINDINGS: Brain: Generalized age-related cerebral atrophy with mild chronic microvascular ischemic disease. Chronic right cerebellar infarct noted. No acute intracranial hemorrhage. No  acute large vessel territory infarct. No mass lesion or midline shift. No hydrocephalus or extra-axial fluid collection. Vascular: No abnormal hyperdense vessel. Calcified atherosclerosis present at the skull base. Skull: Scalp soft tissues demonstrate no acute finding. Calvarium intact. 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 Globes orbital soft tissues demonstrate no other acute finding. Paranasal sinuses are largely clear. Trace right mastoid effusion noted, of doubtful significance. Other: None. IMPRESSION: 1. No acute intracranial abnormality. 2. Generalized age-related cerebral  atrophy with mild chronic small vessel ischemic disease. Chronic right cerebellar infarct. Electronically Signed   By: Virgia Griffins M.D.   On: 04/30/2024 01:54    Family Communication: Discussed with patient, daughter at bedside. They understand and agree. All questions answered.  Disposition: Status is: Inpatient Remains inpatient appropriate because: family discussion, placement, wound healing, palliative discussion.  Planned Discharge Destination: Rehab     Time spent: 40 minutes  Author: Aisha Hove, MD 05/01/2024 5:22 PM Secure chat 7am to 7pm For on call review [email or web address].

## 2024-05-01 NOTE — Progress Notes (Signed)
 Occupational Therapy Treatment Patient Details Name: Earl [name] MRN: [id number] DOB: [birth year] Today's Date: 05/01/2024   History of present illness 83 y.o. male admitted from [facility] SNF rehab on 03/04/24 with SOB, sacral pain, Afib with RVR. Pt with acute metabolic encephalopathy, rhabdomyolysis and sepsis secondary to sacral wound infection. 4/15 & 4/17 bedside sacral wound debridement. PMhx: adm 3/26-4/08/2024 with fall, AMS, COVID; d/c to SNF rehab. CVA (2021, residual R-side weakness), T2DM, HTN, CHF, MGUS, Afib on Eliquis .   OT comments  Pt in supine in bed upon OT arrival eating breakfast. Pt agreeable to work with OT and assist with self feeding task. Session focused on rolling in bed, positioning in midline for self position for eating meal. Pt with impaired grip/grasp, but able to feed himslef with R hand without spillage with use of red foam built up grip, required assist to cut up pancake into smaller pieces and to scoop magic cup with spoon. Pt tolerated AAROM B VFI:EPPIRJJO FF/ABD/IR/ER, wrist flex/ext, elbow flex/ext and B hand exercises with blue foam block and B elbow and shoulder exercises with yellow theraband. Pt very talkative but with difficulty of clarity of speech, very pleasant and cooperative. OT will continue to follow acutely      If plan is discharge home, recommend the following:  A lot of help with walking and/or transfers;A lot of help with bathing/dressing/bathroom;Assistance with cooking/housework;Assist for transportation;Help with stairs or ramp for entrance;Two people to help with walking and/or transfers   Equipment Recommendations  Other (comment) (defer)    Recommendations for Other Services      Precautions / Restrictions Precautions Precautions: Fall;Other (comment) Recall of Precautions/Restrictions: Intact Precaution/Restrictions Comments: significant pain from sacral wound, tends to want check-in/premedication prior to EOB/OOB but  varies Restrictions Weight Bearing Restrictions Per Provider Order: No       Mobility Bed Mobility Overal bed mobility: Needs Assistance Bed Mobility: Rolling Rolling: Max assist, Used rails              Transfers                         Balance                                           ADL either performed or assessed with clinical judgement   ADL Overall ADL's : Needs assistance/impaired Eating/Feeding: Minimal assistance;Set up Eating/Feeding Details (indicate cue type and reason): pt able to feed himself with R hand without spillage, required assist to cut up pancake into smaller pieces and to scoop magic cup Grooming: Wash/dry hands;Wash/dry face;Set up;Bed level           Upper Body Dressing : Bed level;Moderate assistance           Toileting- Clothing Manipulation and Hygiene: Total assistance;Bed level              Extremity/Trunk Assessment Upper Extremity Assessment Upper Extremity Assessment: Generalized weakness RUE Deficits / Details: impaired grip/grasp, but able to feed himslef with R hand without spillage, required assist to cut up pancake into smaller pieces and to scoop magic cup. RUE Coordination: decreased fine motor LUE Coordination: decreased fine motor            Vision Baseline Vision/History: 1 Wears glasses Ability to See in Adequate Light: 0 Adequate Patient Visual Report: No change from baseline  Perception     Praxis     Communication Communication Communication: Impaired Factors Affecting Communication: Reduced clarity of speech   Cognition Arousal: Alert Behavior During Therapy: WFL for tasks assessed/performed                                 Following commands: Impaired Following commands impaired: Only follows one step commands consistently      Cueing   Cueing Techniques: Verbal cues, Tactile cues, Visual cues  Exercises Other Exercises Other Exercises: B  hand exercises with blue foam block and B elbow and shoulder exercises with yellow theraband    Shoulder Instructions       General Comments      Pertinent Vitals/ Pain       Pain Assessment Pain Assessment: Faces Faces Pain Scale: Hurts a little bit Pain Location: generalized, R shoulder with FF 75 degrees Pain Descriptors / Indicators: Grimacing, Discomfort Pain Intervention(s): Limited activity within patient's tolerance, Monitored during session, Premedicated before session, Repositioned  Home Living                                          Prior Functioning/Environment              Frequency  Min 2X/week        Progress Toward Goals  OT Goals(current goals can now be found in the care plan section)  Progress towards OT goals: Progressing toward goals     Plan      Co-evaluation                 AM-PAC OT 6 Clicks Daily Activity     Outcome Measure   Help from another person eating meals?: A Little Help from another person taking care of personal grooming?: A Little Help from another person toileting, which includes using toliet, bedpan, or urinal?: Total Help from another person bathing (including washing, rinsing, drying)?: A Lot Help from another person to put on and taking off regular upper body clothing?: A Lot Help from another person to put on and taking off regular lower body clothing?: Total 6 Click Score: 12    End of Session Equipment Utilized During Treatment: Other (comment) (red foam built up grip for utensils)  OT Visit Diagnosis: Other abnormalities of gait and mobility (R26.89);Muscle weakness (generalized) (M62.81);History of falling (Z91.81) Pain - Right/Left: Right Pain - part of body: Shoulder   Activity Tolerance Patient tolerated treatment well   Patient Left in bed;with call bell/phone within reach   Nurse Communication Mobility status        Time: 4098-1191 OT Time Calculation (min): 26  min  Charges: OT General Charges $OT Visit: 1 Visit OT Treatments $Self Care/Home Management : 8-22 mins $Therapeutic Exercise: 8-22 mins    [name] 05/01/2024, 11:36 AM

## 2024-05-01 NOTE — Progress Notes (Signed)
 Daily Progress Note   Patient Name: Earl [name]       Date: 05/01/2024 DOB: Jan 12, 1941  Age: 83 y.o. MRN#: [id number] Attending Physician: Aisha Hove, MD Primary Care Physician: [name], MD Admit Date: 03/04/2024  Reason for Consultation/Follow-up: Establishing goals of care  Subjective: Medical records reviewed including progress notes, labs, imaging, MAR. Patient assessed at the bedside. He appears to be in pain and confirms this, though he prefers to endure it rather than take any pain medication at this time. No family was present during my visit.  Created space and opportunity for patient and family's thoughts and feelings on patient's current illness. He is not very happy with how things are going but when I attempted to elicit specific concerns he requested that I call his daughter to discuss. He is interested in seeing SLP about his voice, which bothers him. Emotional support and therapeutic listening was provided.  Called patient's daughter Para for palliative support but was unable to reach. Voicemail with PMT contact information was left. She then returned my call and expressed interest in weaning overnight OxyContin  down further to 5mg . Counseled on the inability to give half tablet of extended release opioid medication. We discussed option of alternative opioids with less potency, such as Tramadol , or short-acting Oxycodone  at night with the understanding that it will not last through the night. She would like to think about this and continue discussions. She is not usually available much on Thursdays and Fridays. Offered to ask my colleagues to reach out over the weekend for support and she preferred to revisit discussion with this PA on Monday when I am back on service.  Questions and concerns addressed.  PMT will continue to support holistically.   Length of Stay: 24   Physical Exam Vitals and nursing note reviewed.  Constitutional:      General: He is not in acute distress.    Appearance: He is ill-appearing.  Cardiovascular:     Rate and Rhythm: Normal rate.  Pulmonary:     Effort: Pulmonary effort is normal.  Skin:    General: Skin is warm and dry.  Neurological:     Mental Status: He is alert.  Psychiatric:        Behavior: Behavior normal. Behavior is cooperative.            Vital Signs: BP 130/65 (BP Location: Right Arm)   Pulse 67   Temp 98.4 F (36.9 C)   Resp 20   Ht 5' 11 (1.803 m)   Wt 85.7 kg   SpO2 100%   BMI 26.36 kg/m  SpO2: SpO2: 100 % O2 Device: O2 Device: Room Air O2 Flow Rate:        Palliative Assessment/Data: 20% to 30%   Palliative Care Assessment & Plan   Patient Profile: Per intake H&P --> Patient is a 83 year old male with history of paroxysmal A-fib, insulin -dependent diabetes, hypertension, hyperlipidemia, CVA with residual right-sided weakness, MGUS, GERD, BPH, hypothyroidism, HFrEF with EF of 40 to 45%, chronic pancytopenia who presented to the emergency department from SNF with complaint of shortness of breath, wound, pain in the sacral area.    Palliative care evaluated [place] earlier this month and  have been asked to get re-involved for additional goals of care conversations.   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 Recent COVID infection Severe protein calorie malnutrition  Recommendations/Plan: Continue DNR/DNI Continue current care plan Discussed pain management options with patient's daughter PMT will continue to support intermittentl   Prognosis: Concerning prognosis in light of recurrent hospitalizations, declining functional/nutritional status, and several chronic  comorbidities  Discharge Planning: Skilled Nursing Facility for rehab with Palliative care service follow-up   Care plan was discussed with patient, patient's daughter, MD   Angeline Kemps, PA-C  Palliative Medicine Team Team phone # [phone]  Thank you for allowing the Palliative Medicine Team to assist in the care of this patient. Please utilize secure chat with additional questions, if there is no response within 30 minutes please call the above phone number.  Palliative Medicine Team providers are available by phone from 7am to 7pm daily and can be reached through the team cell phone.  Should this patient require assistance outside of these hours, please call the patient's attending physician.

## 2024-05-02 ENCOUNTER — Encounter (HOSPITAL_COMMUNITY): Payer: Self-pay | Admitting: Internal Medicine

## 2024-05-02 DIAGNOSIS — L98429 Non-pressure chronic ulcer of back with unspecified severity: Secondary | ICD-10-CM | POA: Diagnosis not present

## 2024-05-02 DIAGNOSIS — A419 Sepsis, unspecified organism: Secondary | ICD-10-CM | POA: Diagnosis not present

## 2024-05-02 DIAGNOSIS — I4891 Unspecified atrial fibrillation: Secondary | ICD-10-CM | POA: Diagnosis not present

## 2024-05-02 DIAGNOSIS — E854 Organ-limited amyloidosis: Secondary | ICD-10-CM | POA: Diagnosis not present

## 2024-05-02 LAB — GLUCOSE, CAPILLARY
Glucose-Capillary: 142 mg/dL — ABNORMAL HIGH (ref 70–99)
Glucose-Capillary: 126 mg/dL — ABNORMAL HIGH (ref 70–99)
Glucose-Capillary: 163 mg/dL — ABNORMAL HIGH (ref 70–99)
Glucose-Capillary: 140 mg/dL — ABNORMAL HIGH (ref 70–99)
Glucose-Capillary: 67 mg/dL — ABNORMAL LOW (ref 70–99)
Glucose-Capillary: 170 mg/dL — ABNORMAL HIGH (ref 70–99)

## 2024-05-02 NOTE — Progress Notes (Signed)
 Psychosocial Progressive/Outcome: ANOx4, calm and cooperative  Daughter at bedside   Pain/Comfort Progression/Outcome: Pain managed with scheduled medication   Clinical Progression/Outcome: Adequate fluid and diet intake  Turned and repositioned during shift  Voids to external cath  Had BM Dressing D/C/I Slept in between care Maintained safety

## 2024-05-02 NOTE — Plan of Care (Signed)
  Problem: Fluid Volume: Goal: Hemodynamic stability will improve Outcome: Progressing   Problem: Clinical Measurements: Goal: Diagnostic test results will improve Outcome: Progressing Goal: Signs and symptoms of infection will decrease Outcome: Progressing   Problem: Respiratory: Goal: Ability to maintain adequate ventilation will improve Outcome: Progressing   Problem: Education: Goal: Knowledge of risk factors and measures for prevention of condition will improve Outcome: Progressing   Problem: Coping: Goal: Psychosocial and spiritual needs will be supported Outcome: Progressing   Problem: Respiratory: Goal: Will maintain a patent airway Outcome: Progressing Goal: Complications related to the disease process, condition or treatment will be avoided or minimized Outcome: Progressing   Problem: Education: Goal: Ability to describe self-care measures that may prevent or decrease complications (Diabetes Survival Skills Education) will improve Outcome: Progressing   Problem: Coping: Goal: Ability to adjust to condition or change in health will improve Outcome: Progressing   Problem: Fluid Volume: Goal: Ability to maintain a balanced intake and output will improve Outcome: Progressing   Problem: Health Behavior/Discharge Planning: Goal: Ability to manage health-related needs will improve Outcome: Progressing   Problem: Metabolic: Goal: Ability to maintain appropriate glucose levels will improve Outcome: Progressing   Problem: Nutritional: Goal: Maintenance of adequate nutrition will improve Outcome: Progressing Goal: Progress toward achieving an optimal weight will improve Outcome: Progressing   Problem: Skin Integrity: Goal: Risk for impaired skin integrity will decrease Outcome: Progressing   Problem: Tissue Perfusion: Goal: Adequacy of tissue perfusion will improve Outcome: Progressing   Problem: Education: Goal: Knowledge of General Education information  will improve Description: Including pain rating scale, medication(s)/side effects and non-pharmacologic comfort measures Outcome: Progressing   Problem: Health Behavior/Discharge Planning: Goal: Ability to manage health-related needs will improve Outcome: Progressing   Problem: Clinical Measurements: Goal: Ability to maintain clinical measurements within normal limits will improve Outcome: Progressing Goal: Will remain free from infection Outcome: Progressing Goal: Diagnostic test results will improve Outcome: Progressing Goal: Respiratory complications will improve Outcome: Progressing Goal: Cardiovascular complication will be avoided Outcome: Progressing   Problem: Activity: Goal: Risk for activity intolerance will decrease Outcome: Progressing   Problem: Nutrition: Goal: Adequate nutrition will be maintained Outcome: Progressing   Problem: Coping: Goal: Level of anxiety will decrease Outcome: Progressing   Problem: Elimination: Goal: Will not experience complications related to bowel motility Outcome: Progressing Goal: Will not experience complications related to urinary retention Outcome: Progressing   Problem: Pain Managment: Goal: General experience of comfort will improve and/or be controlled Outcome: Progressing   Problem: Safety: Goal: Ability to remain free from injury will improve Outcome: Progressing   Problem: Skin Integrity: Goal: Risk for impaired skin integrity will decrease Outcome: Progressing

## 2024-05-02 NOTE — Progress Notes (Signed)
 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 Critical care was consulted for persistent hypotension. Respiratory panel was negative. General surgery was consulted for sacral wound debridement with bedside excision on 03/05/2024 and 03/07/2024. He is now hemodynamically stable for discharge.    6/4: Some concern of increasing discharge and some order from wound so wound care was reconsulted-repeat wound cultures were sent.  Daughter concerned of some shortness of breath which was not observed during rounds, chest x-ray with mild bibasilar subsegmental atelectasis or edema with small pleural effusions.  Remained on room air.   6/5:Hemodynamically stable.  Repeat cultures from wound yesterday with no organism.  6/6 - he is lying in bed, weak, does not wish to go to any facility.   6/7: Wound cultures 6/4 back with pseudomonas, klebsiella.   6/8: daughter at bedside, wishes he can be taken out of room for his birthday.   6/9- Seems more short of breath, feels dizzy, lightheaded. Daughter at bedside concerned about his abdominal distension, discomfort. Also has bilateral thigh swelling.  6/10 - he felt better, blood sugars low overnight, insulin  stopped, started d5.  6/11 - has shortness of breath, swallow difficulty. Has good bowel movement.  Palliative involved.   Assessment and Plan: Severe sepsis - resolved On presentation, lactic acidosis, leukocytosis, fever, secondary to infected chronic wound.  Patient did receive multiple antibiotics during long hospital stay.  Sepsis now resolved.   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  Completed antibiotics 5/1.  General surgery now signed off.  Wound care following.  Infectious disease signed off.  Continue wound care management/regimen.  Offload pressure with every 2 hours turns, adequate nutrition.    Cleanse with Vashe#[id number]. Apply Aquacel Ag+ (silver hydrofiber) Lawson # [id number] in the wound bed and fill the tunneling using a swab (change daily), cover with sacral foam dressing, change daily and PRN soilage Repeat cultures were sent on 6/4 grew klebsiella, pseudomonas. Sensitivities reviewed. continue IV ceftazidime x 7days as per pharmacy protocol.   Other pressure injuries Continue wound care, off loading, as ordered.   Challenging as he is bed bound. Air mattress, frequent positioning for off loading. WOC on board  Acute on chronic pain - secondary to above Continue OxyContin  10mg  at bedtime, with oxycodone  2.5mg  q4hr prn.  Plan to wean off opioids entirely by discharge. On and off severe pain requiring PRN meds.   Paroxysmal atrial fibrillation Continue amiodarone  and Eliquis .    HFrEF/cardiac amyloidosis/NICM On tafamadis for amyloid.  GDMT not in place secondary to hypotension.  Continue oral Lasix  20 mg daily.  Continue IV Lasix  as needed to relieve symptoms (sob, abdominal wall, thigh edema). Cardiology did follow him 6/9 discussed with daughter- he has end stage CHF and prognosis is poor. He could not tolerate GDMT. Daughter aware of his poor condition, prognosis. Palliative on board for pain control and  goals of care discussion.   Insulin -dependent diabetes mellitus Blood sugars better today. Continue semglee  8  units, accucheks, sliding scale insulin . Monitor for hypoglycemia  Anemia of chronic disease S/p 1 unit PRBC 4/30.  Hemoglobin stable, no active bleeding.   Dysphagia/severe protein calorie malnutrition Continue 3 times daily protein calorie supplementation.  Vitamin supplementation.  Continues on pured diet.  Daughter noted that he has improvement in appetite.     Physical debilitation of muscle weakness PT/ OT working with him.  However, limited ability to sit upright in chair secondary to pain from sacral ulcer.   Continue frequent repositioning.   Goals of care Patient is medically appropriate for discharge at this time. He is DNR-limited intervention. His overall prognosis is poor. Patient would like to continue current treatment, refuses to go to any facility and wishes to go home. Daughter wishes transition to home care once his wounds are better.  However given his debility, poor prognosis, SNF for full time care would be appropriate.  TOC working with admin regarding safe disposition. Palliative follow up on goals of care, pain control.  Nutrition Documentation    Flowsheet Row ED to Hosp-Admission (Current) from 03/04/2024 in [facility]  Nutrition Problem Severe Malnutrition  Etiology chronic illness  Nutrition Goal Patient will meet greater than or equal to 90% of their needs  Interventions Ensure Enlive (each supplement provides 350kcal and 20 grams of protein), Magic cup, Hormel Shake, MVI, Juven, Other (Comment)  [vitamin C ]     ,  Active Pressure Injury/Wound(s)     Pressure Ulcer  Duration          Pressure Injury 02/26/24 Coccyx Mid Stage 4 - Full thickness tissue loss with exposed bone, tendon or muscle. 63 days   Pressure Injury 02/26/24 Toe (Comment  which one) Anterior;Right Deep Tissue Pressure Injury - Purple or maroon localized area of discolored intact skin or blood-filled blister due to damage of underlying soft tissue from pressure  and/or shear. right gre 63 days   Pressure Injury 03/06/24 Hip Anterior;Left;Proximal Unstageable - Full thickness tissue loss in which the base of the injury is covered by slough (yellow, tan, gray, green or brown) and/or eschar (tan, brown or black) in the wound bed. unstageable 53 days   Pressure Injury 03/06/24 Shoulder Left Unstageable - Full thickness tissue loss in which the base of the injury is covered by slough (yellow, tan, gray, green or brown) and/or eschar (tan, brown or black) in the wound bed. unstageable 53 days   Pressure Injury 04/23/24 Heel Right Unstageable - Full thickness tissue loss in which the base of the injury is covered by slough (yellow, tan, gray, green or brown) and/or eschar (tan, brown or black) in the wound bed. DTPI evolving into unstageable 6 days           (Optional):26781}   Out of bed to chair. Incentive spirometry. Nursing supportive care. Fall, aspiration precautions. Diet:  Diet Orders (From admission, onward)     Start     Ordered   04/30/24 1436  DIET - DYS 1 Room service appropriate? No; Fluid consistency: Thin  Diet effective now       Question Answer Comment  Room service appropriate? No   Fluid consistency: Thin      04/30/24 1436           DVT prophylaxis: SCDs Start: 03/04/24 2151 Place TED hose Start: 03/04/24 2151 apixaban  (ELIQUIS ) tablet 5 mg  Level of care:  Telemetry Medical   Code Status: Limited: Do not attempt resuscitation (DNR) -DNR-LIMITED -Do Not Intubate/DNI   Subjective: Patient is seen and examined today morning. He is shirt of breath, sugars high, fluids stopped. He is eating fair. RN noted, daughter at bedside asked for SLP eval given difficulty swallowing.   Physical Exam: Vitals:   05/02/24 0010 05/02/24 0428 05/02/24 0758 05/02/24 1220  BP: 130/67 135/85 120/73 (!) 142/67  Pulse: 77 68 80 69  Resp: 16 16 18 17   Temp: 97.7 F (36.5 C) 97.6 F (36.4 C) 98.3 F (36.8 C) 98 F (36.7 C)  TempSrc:  Oral   Oral  SpO2: 99% 100% 100% 100%  Weight:  86.6 kg    Height:        General - Elderly ill African American male, mild respiratory distress HEENT - PERRLA, EOMI, atraumatic head, non tender sinuses. Lung - Clear, basal rales, rhonchi, wheezes. Heart - S1, S2 heard, no murmurs, rubs, b/l thigh swelling. Abdomen - Soft, non tender, distended, bowel sounds good Neuro - Alert, awake and oriented, non focal exam. Skin - Warm and dry. Left hip, back, heel dressings noted.  Data Reviewed:      Latest Ref Rng & Units 05/01/2024    3:38 AM 04/28/2024    8:21 AM 04/24/2024    5:58 AM  CBC  WBC 4.0 - 10.5 K/uL 5.6  5.2  6.0   Hemoglobin 13.0 - 17.0 g/dL 9.8  40.9  81.1   Hematocrit 39.0 - 52.0 % 30.7  33.2  32.2   Platelets 150 - 400 K/uL 280  292  307       Latest Ref Rng & Units 05/01/2024    3:38 AM 04/28/2024    8:21 AM 04/24/2024    5:58 AM  BMP  Glucose 70 - 99 mg/dL 914  782  91   BUN 8 - 23 mg/dL 35  29  30   Creatinine 0.61 - 1.24 mg/dL 9.56  2.13  0.86   Sodium 135 - 145 mmol/L 139  141  141   Potassium 3.5 - 5.1 mmol/L 3.6  3.5  3.6   Chloride 98 - 111 mmol/L 104  106  111   CO2 22 - 32 mmol/L 26  23  20    Calcium  8.9 - 10.3 mg/dL 8.1  8.2  8.4    No results found.   Family Communication: Discussed with patient, he understand and agree. All questions answered.  Disposition: Status is: Inpatient Remains inpatient appropriate because: placement, wound healing, palliative discussion.  Planned Discharge Destination: Rehab     Time spent: 38 minutes  Author: Aisha Hove, MD 05/02/2024 3:44 PM Secure chat 7am to 7pm For on call review [email or web address].

## 2024-05-02 NOTE — Evaluation (Signed)
 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/29/2024   Protein-calorie malnutrition, severe 03/20/2024   Community acquired pneumonia 03/17/2024   Sepsis (HCC) 03/04/2024   Atrial flutter (HCC) 03/04/2024   GERD (gastroesophageal reflux disease) 03/04/2024   Sacral ulcer (HCC) 03/04/2024   History of hypertension 03/04/2024   Severe sepsis (HCC) 03/04/2024   Atrial fibrillation with RVR (HCC) 03/04/2024   Malnutrition of moderate degree 02/27/2024   Syncope, vasovagal 02/16/2024   COVID-19 virus infection 02/15/2024   Acute metabolic encephalopathy 11/14/2023   Failure to thrive in adult 11/14/2023   Generalized weakness 11/14/2023   Fall at home, initial encounter 11/14/2023   Muscle spasms of both lower extremities 11/14/2023   Pressure ulcer 11/14/2023   Chronic systolic CHF (congestive heart failure) (HCC) 09/11/2022   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   Acute on chronic congestive heart failure (HCC) 07/24/2021   Iron deficiency anemia 07/09/2021   Diabetic 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 (HCC) 05/17/2021   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   Acquired thrombophilia (HCC) 09/09/2019   Abdominal aortic atherosclerosis (HCC) by Abd CTscvn  2016 09/09/2019   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   Chronic anticoagulation 09/04/2017   Ataxia, post-stroke    History of CVA (cerebrovascular accident)    Hypothyroidism 09/13/2016   Vitamin D  deficiency 04/29/2014   Insulin  dependent type 2 diabetes mellitus (HCC)    BPH (benign prostatic hyperplasia)    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   Chronic anemia 11/07/2013    Essential hypertension 11/07/2013   Hyperlipidemia 11/07/2013   Paroxysmal atrial fibrillation (HCC) 09/18/2012   Past Medical History:  Past Medical History:  Diagnosis Date   Adenomatous colon polyp    Allergy    Anal fissure    Arthritis    BPH (benign prostatic hyperplasia)    Diabetes mellitus (HCC)    GERD (gastroesophageal reflux disease)    Hyperlipidemia    Hypertension    Hypothyroidism    Other testicular hypofunction    Stroke [facility])    Past Surgical History:  Past Surgical History:  Procedure Laterality Date   CIRCUMCISION N/A 06/11/2019   Procedure: CIRCUMCISION ADULT;  Surgeon: Christina Coyer, MD;  Location: [facility] ORS;  Service: Urology;  Laterality: N/A;   COLONOSCOPY     INGUINAL HERNIA REPAIR Bilateral    1968 and 2004   LOOP RECORDER INSERTION N/A 07/28/2017   Procedure: LOOP RECORDER INSERTION;  Surgeon: Verona Goodwill, MD;  Location: [facility] INVASIVE CV LAB;  Service: Cardiovascular;  Laterality: N/A;   RIGHT/LEFT HEART CATH AND CORONARY ANGIOGRAPHY N/A 10/07/2021   Procedure: RIGHT/LEFT HEART CATH AND CORONARY ANGIOGRAPHY;  Surgeon: Darlis Eisenmenger, MD;  Location: [facility] INVASIVE CV LAB;  Service: Cardiovascular;  Laterality: N/A;   TEE WITHOUT CARDIOVERSION N/A 07/28/2017   Procedure: TRANSESOPHAGEAL ECHOCARDIOGRAM (TEE);  Surgeon: Jacqueline Matsu, MD;  Location: [facility] ENDOSCOPY;  Service: Cardiovascular;  Laterality: N/A;   HPI:  Patient is an 83 y.o. 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 He was discharged from the hospital (admitted for acute metabolic encephalopathy, hypotensive, hypoglycemic) to [facility] for rehab. He presented back to the hospital four days later  on 03/04/24 with SOB and pain in sacral area. 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 SLP previous saw pt to assess and manage concerns for dysphagia. SLP re-consulted on 05/02/24 for voice assessment.   Assessment / Plan / Recommendation Clinical Impression   SLP re-consulted to evaluate pt's articulation and voice. Pt reported decline in respiratory strength in vocal quality over the past two weeks. Voice observed to be strained with pitch elevation and pitch breaks in running speech. Reduced vocal intensity and increased RR observed with running speech. Pt observed to get fatigued quickly. We discussed basic posture changes and vocal hydration to support vocal clarity and respiratory support. Despite repositioning efforts, pt still reclined in bed at about 60 degrees. He reported good intake of liquids and was sipping on liquids during session.   Treatment: SLP introduced inspiratory spirometry to aid respiratory strengthening Pt able to complete x12 sets with volume variations of 237mL-750mL. Pt able to achieve highest volume with initial inhalation. All subsequent volumes steadily decreased as a result of fatigue. Pt encouraged to complete x3-4 set of 5 each day with a goal of maintaining ~622mL with each repetition. Pt verbalized understanding.   SLP will continue to follow to complete respiratory strengthening exercises with pt to support vocal quality and strength. Will plan to add EMST exercise next session.     SLP Assessment  SLP Recommendation/Assessment: Patient needs continued Speech Language Pathology Services SLP Visit Diagnosis: Cognitive communication deficit (R41.841)     Assistance Recommended at Discharge  Frequent or constant Supervision/Assistance  Functional Status Assessment Patient has had a recent decline in their functional status and demonstrates the ability to make significant improvements in function in a reasonable and predictable amount of time.  Frequency and Duration min 1 x/week  4 weeks      SLP  Evaluation Cognition          Comprehension       Expression Expression Primary Mode of Expression: Verbal Verbal Expression Overall Verbal Expression: Impaired Pragmatics: No impairment Interfering Components: Attention;Speech intelligibility   Oral / Motor  Oral Motor/Sensory Function Overall Oral Motor/Sensory Function: Generalized oral weakness            Leverette Read 05/02/2024, 11:39 AM

## 2024-05-03 DIAGNOSIS — E854 Organ-limited amyloidosis: Secondary | ICD-10-CM | POA: Diagnosis not present

## 2024-05-03 DIAGNOSIS — L98429 Non-pressure chronic ulcer of back with unspecified severity: Secondary | ICD-10-CM | POA: Diagnosis not present

## 2024-05-03 DIAGNOSIS — I4891 Unspecified atrial fibrillation: Secondary | ICD-10-CM | POA: Diagnosis not present

## 2024-05-03 DIAGNOSIS — A419 Sepsis, unspecified organism: Secondary | ICD-10-CM | POA: Diagnosis not present

## 2024-05-03 LAB — GLUCOSE, CAPILLARY
Glucose-Capillary: 148 mg/dL — ABNORMAL HIGH (ref 70–99)
Glucose-Capillary: 155 mg/dL — ABNORMAL HIGH (ref 70–99)
Glucose-Capillary: 335 mg/dL — ABNORMAL HIGH (ref 70–99)
Glucose-Capillary: 125 mg/dL — ABNORMAL HIGH (ref 70–99)
Glucose-Capillary: 137 mg/dL — ABNORMAL HIGH (ref 70–99)

## 2024-05-03 MED ORDER — FUROSEMIDE 10 MG/ML IJ SOLN
20.0000 mg | Freq: Once | INTRAMUSCULAR | Status: AC
  Filled 2024-05-03: qty 2

## 2024-05-03 NOTE — Progress Notes (Signed)
 Progress Note   Patient: Earl [name] GNF:[id number] DOB: May 13, 1941 DOA: 03/04/2024     60 DOS: the patient was seen and examined on 05/03/2024   Brief hospital course: 83 yrs old male with past medical history of paroxysmal atrial fibrillation on Eliquis , insulin -dependent diabetes mellitus type 2, essential hypertension, CVA with right residual weakness, MGUS, BPH, HFr EF of 40%, He was discharged, recently from [facility] after admitted for acute metabolic encephalopathy, rhabdomyolysis and orthostatic, chronic pancytopenia presents to the ED from skilled nursing facility for shortness of breath, sacral pain, In the ED, he was found to be in RVR, tachypneic with saturations greater than 90%. Critical care was consulted for persistent hypotension. Respiratory panel was negative. General surgery was consulted for sacral wound debridement with bedside excision on 03/05/2024 and 03/07/2024. He is now hemodynamically stable for discharge.    6/4: Some concern of increasing discharge and some order from wound so wound care was reconsulted-repeat wound cultures were sent.  Daughter concerned of some shortness of breath which was not observed during rounds, chest x-ray with mild bibasilar subsegmental atelectasis or edema with small pleural effusions.  Remained on room air.   6/5:Hemodynamically stable.  Repeat cultures from wound yesterday with no organism.  6/6 - he is lying in bed, weak, does not wish to go to any facility.   6/7: Wound cultures 6/4 back with pseudomonas, klebsiella.   6/8: daughter at bedside, wishes he can be taken out of room for his birthday.   6/9- Seems more short of breath, feels dizzy, lightheaded. Daughter at bedside concerned about his abdominal distension, discomfort. Also has bilateral thigh swelling. Cardiology advised palliative, lasix  as needed.  6/10 - he felt better, blood sugars low overnight, insulin  stopped, started d5.   6/11 till 6/13 - Sugars  improved, eating fair. Restarted lantus , accucheks. On and off shortness of breath. Daughter asked about his wounds. SLP, WOC, Palliative involved.   Assessment and Plan: Severe sepsis - resolved On presentation, lactic acidosis, leukocytosis, fever, secondary to infected chronic wound.  Patient did receive multiple antibiotics during long hospital stay.  Sepsis now resolved.   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  Completed antibiotics 5/1.  General surgery now signed off.  Wound care following.  Infectious disease signed off.  Continue wound care management/regimen.  Offload pressure with every 2 hours turns, adequate nutrition.  Wound care as below -   1 - Left Hip PI unstageable Clean with Vashe ( lawson # [id number]), allow to air dry, apply santyl  to wound bed, cover with saline soaked gauze, and then cover with foam dressing, change daily  2 - Left shoulder unstageable Clean with Vashe ( lawson # [id number]), allow to air dry, apply santyl  to wound bed, cover with saline soaked gauze, and then cover with foam dressing, change daily 3 - Sacral Unstageable PI Cleanse with Vashe#[id number]. Apply 1/3 sheet of Aquacel Ag+ (silver hydrofiber) Lawson # [id number] in the wound bed and fill the tunneling using a swab, cover with sacral foam dressing, change daily and PRN soilage 4.Right heel DTPI Clean with Vashe ( lawson # [id number]), allow to air dry, apply santyl  to wound bed, cover with saline soaked gauze, and then cover with foam dressing, change daily Both feet should be placed in Prevalon boots to offload pressure Timm Foot [phone])  Repeat cultures were sent on 6/4 grew klebsiella, pseudomonas. Sensitivities reviewed. continue IV ceftazidime  x 7days as per pharmacy protocol.   Acute  on chronic pain - secondary to above Continue OxyContin  10mg  at bedtime, with oxycodone  2.5mg  q4hr prn.  Plan to wean off opioids entirely by discharge. On and off severe pain  requiring PRN meds.   Paroxysmal atrial fibrillation Continue amiodarone  and Eliquis .    HFrEF/cardiac amyloidosis/NICM On tafamadis for amyloid.  GDMT not in place secondary to hypotension.  Continue oral Lasix  20 mg daily.  Continue IV Lasix  as needed to relieve symptoms (sob, abdominal wall, thigh edema). Cardiology did follow him 6/9 discussed with daughter- he has end stage CHF and prognosis is poor. He could not tolerate GDMT. Daughter aware of his poor condition, prognosis. Palliative on board for pain control and goals of care discussion.   Insulin -dependent diabetes mellitus Blood sugars better today. Continue semglee  8 units, accucheks, sliding scale insulin . Monitor for hypoglycemia  Anemia of chronic disease S/p 1 unit PRBC 4/30.  Hemoglobin stable, no active bleeding.   Dysphagia/severe protein calorie malnutrition Continue 3 times daily protein calorie supplementation.  Vitamin supplementation.  Continues on pured diet.  Daughter noted that he has improvement in appetite.     Physical debilitation of muscle weakness PT/ OT working with him.  However, limited ability to sit upright in chair secondary to pain from sacral ulcer.   Continue frequent repositioning.   Goals of care Patient is medically appropriate for discharge at this time. He is DNR-limited intervention. His overall prognosis is poor. Patient would like to continue current treatment, refuses to go to any facility and wishes to go home. Daughter wishes transition to home care once his wounds are better.  However given his debility, poor prognosis, SNF for full time care would be appropriate.  TOC working with admin regarding safe disposition. Palliative follow up on goals of care, pain control.  Nutrition Documentation    Flowsheet Row ED to Hosp-Admission (Current) from 03/04/2024 in [facility]  Nutrition Problem Severe Malnutrition  Etiology chronic illness  Nutrition Goal Patient  will meet greater than or equal to 90% of their needs  Interventions Ensure Enlive (each supplement provides 350kcal and 20 grams of protein), Magic cup, Hormel Shake, MVI, Juven, Other (Comment)  [vitamin C ]     ,  Active Pressure Injury/Wound(s)     Pressure Ulcer  Duration          Pressure Injury 02/26/24 Coccyx Mid Stage 4 - Full thickness tissue loss with exposed bone, tendon or muscle. 63 days   Pressure Injury 02/26/24 Toe (Comment  which one) Anterior;Right Deep Tissue Pressure Injury - Purple or maroon localized area of discolored intact skin or blood-filled blister due to damage of underlying soft tissue from pressure and/or shear. right gre 63 days   Pressure Injury 03/06/24 Hip Anterior;Left;Proximal Unstageable - Full thickness tissue loss in which the base of the injury is covered by slough (yellow, tan, gray, green or brown) and/or eschar (tan, brown or black) in the wound bed. unstageable 53 days   Pressure Injury 03/06/24 Shoulder Left Unstageable - Full thickness tissue loss in which the base of the injury is covered by slough (yellow, tan, gray, green or brown) and/or eschar (tan, brown or black) in the wound bed. unstageable 53 days   Pressure Injury 04/23/24 Heel Right Unstageable - Full thickness tissue loss in which the base of the injury is covered by slough (yellow, tan, gray, green or brown) and/or eschar (tan, brown or black) in the wound bed. DTPI evolving into unstageable 6 days           (  Optional):[id number]}   Out of bed to chair. Incentive spirometry. Nursing supportive care. Fall, aspiration precautions. Diet:  Diet Orders (From admission, onward)     Start     Ordered   04/30/24 1436  DIET - DYS 1 Room service appropriate? No; Fluid consistency: Thin  Diet effective now       Question Answer Comment  Room service appropriate? No   Fluid consistency: Thin      04/30/24 1436           DVT prophylaxis: SCDs Start: 03/04/24 2151 Place TED hose  Start: 03/04/24 2151 apixaban  (ELIQUIS ) tablet 5 mg  Level of care: Telemetry Medical   Code Status: Limited: Do not attempt resuscitation (DNR) -DNR-LIMITED -Do Not Intubate/DNI   Subjective: Patient is seen and examined today morning. He struggles with breathing. States eating fair. Abdominal distention, leg swelling persists.   Physical Exam: Vitals:   05/02/24 2015 05/02/24 2340 05/03/24 0250 05/03/24 0741  BP: 115/83 139/77 133/77 (!) 126/96  Pulse: 73 (!) 101 67 77  Resp: 18 19 17 18   Temp: 97.6 F (36.4 C) 97.7 F (36.5 C) 98.2 F (36.8 C) 97.7 F (36.5 C)  TempSrc: Oral     SpO2: 98% 100% 100% 99%  Weight:      Height:        General - Elderly ill African American male, mild respiratory distress HEENT - PERRLA, EOMI, atraumatic head, non tender sinuses. Lung - Clear, basal rales, rhonchi, wheezes. Heart - S1, S2 heard, no murmurs, rubs, b/l thigh swelling. Abdomen - Soft, non tender, distended, bowel sounds good Neuro - Alert, awake and oriented, non focal exam. Skin - Warm and dry. Left hip, back, heel dressings noted.  Data Reviewed:      Latest Ref Rng & Units 05/01/2024    3:38 AM 04/28/2024    8:21 AM 04/24/2024    5:58 AM  CBC  WBC 4.0 - 10.5 K/uL 5.6  5.2  6.0   Hemoglobin 13.0 - 17.0 g/dL 9.8  45.4  09.8   Hematocrit 39.0 - 52.0 % 30.7  33.2  32.2   Platelets 150 - 400 K/uL 280  292  307       Latest Ref Rng & Units 05/01/2024    3:38 AM 04/28/2024    8:21 AM 04/24/2024    5:58 AM  BMP  Glucose 70 - 99 mg/dL 119  147  91   BUN 8 - 23 mg/dL 35  29  30   Creatinine 0.61 - 1.24 mg/dL 8.29  5.62  1.30   Sodium 135 - 145 mmol/L 139  141  141   Potassium 3.5 - 5.1 mmol/L 3.6  3.5  3.6   Chloride 98 - 111 mmol/L 104  106  111   CO2 22 - 32 mmol/L 26  23  20    Calcium  8.9 - 10.3 mg/dL 8.1  8.2  8.4    No results found.   Family Communication: Discussed with patient, he understand and agree. All questions answered.  Disposition: Status is:  Inpatient Remains inpatient appropriate because: placement, wound healing, palliative discussion.  Planned Discharge Destination: Rehab     Time spent: 38 minutes  Author: Aisha Hove, MD 05/03/2024 3:35 PM Secure chat 7am to 7pm For on call review [email or web address].

## 2024-05-03 NOTE — Plan of Care (Signed)
  Problem: Fluid Volume: Goal: Hemodynamic stability will improve Outcome: Progressing   Problem: Clinical Measurements: Goal: Diagnostic test results will improve Outcome: Progressing Goal: Signs and symptoms of infection will decrease Outcome: Progressing   Problem: Respiratory: Goal: Ability to maintain adequate ventilation will improve Outcome: Progressing   Problem: Education: Goal: Knowledge of risk factors and measures for prevention of condition will improve Outcome: Progressing   Problem: Coping: Goal: Psychosocial and spiritual needs will be supported Outcome: Progressing   Problem: Respiratory: Goal: Will maintain a patent airway Outcome: Progressing Goal: Complications related to the disease process, condition or treatment will be avoided or minimized Outcome: Progressing   Problem: Education: Goal: Ability to describe self-care measures that may prevent or decrease complications (Diabetes Survival Skills Education) will improve Outcome: Progressing   Problem: Coping: Goal: Ability to adjust to condition or change in health will improve Outcome: Progressing   Problem: Fluid Volume: Goal: Ability to maintain a balanced intake and output will improve Outcome: Progressing   Problem: Health Behavior/Discharge Planning: Goal: Ability to manage health-related needs will improve Outcome: Progressing   Problem: Metabolic: Goal: Ability to maintain appropriate glucose levels will improve Outcome: Progressing   Problem: Nutritional: Goal: Maintenance of adequate nutrition will improve Outcome: Progressing Goal: Progress toward achieving an optimal weight will improve Outcome: Progressing   Problem: Skin Integrity: Goal: Risk for impaired skin integrity will decrease Outcome: Progressing   Problem: Tissue Perfusion: Goal: Adequacy of tissue perfusion will improve Outcome: Progressing   Problem: Education: Goal: Knowledge of General Education information  will improve Description: Including pain rating scale, medication(s)/side effects and non-pharmacologic comfort measures Outcome: Progressing   Problem: Health Behavior/Discharge Planning: Goal: Ability to manage health-related needs will improve Outcome: Progressing   Problem: Clinical Measurements: Goal: Ability to maintain clinical measurements within normal limits will improve Outcome: Progressing Goal: Will remain free from infection Outcome: Progressing Goal: Diagnostic test results will improve Outcome: Progressing Goal: Respiratory complications will improve Outcome: Progressing Goal: Cardiovascular complication will be avoided Outcome: Progressing   Problem: Activity: Goal: Risk for activity intolerance will decrease Outcome: Progressing   Problem: Nutrition: Goal: Adequate nutrition will be maintained Outcome: Progressing   Problem: Coping: Goal: Level of anxiety will decrease Outcome: Progressing   Problem: Elimination: Goal: Will not experience complications related to bowel motility Outcome: Progressing Goal: Will not experience complications related to urinary retention Outcome: Progressing   Problem: Pain Managment: Goal: General experience of comfort will improve and/or be controlled Outcome: Progressing   Problem: Safety: Goal: Ability to remain free from injury will improve Outcome: Progressing   Problem: Skin Integrity: Goal: Risk for impaired skin integrity will decrease Outcome: Progressing

## 2024-05-03 NOTE — Progress Notes (Signed)
 Physical Therapy Treatment Patient Details Name: Ranveer Wahlstrom MRN: [id number] DOB: 03-15-1941 Today's Date: 05/03/2024   History of Present Illness 83 y.o. male admitted from [facility] SNF rehab on 03/04/24 with SOB, sacral pain, Afib with RVR. Pt with acute metabolic encephalopathy, rhabdomyolysis and sepsis secondary to sacral wound infection. 4/15 & 4/17 bedside sacral wound debridement. PMhx: adm 3/26-4/08/2024 with fall, AMS, COVID; d/c to SNF rehab. CVA (2021, residual R-side weakness), T2DM, HTN, CHF, MGUS, Afib on Eliquis .    PT Comments  Pt supine in bed on arrival this session.  He reports he is unable to move OOB and today is not a good day.  He was very recpetive to performing bed mobility and exercises from supine level.  He was able to use railing on head board to boost in supine.  He continues to require rehab in a post acute setting.      If plan is discharge home, recommend the following: Two people to help with walking and/or transfers;Two people to help with bathing/dressing/bathroom;Assistance with feeding;Assist for transportation   Can travel by private vehicle     No  Equipment Recommendations  Hospital bed;Hoyer lift    Recommendations for Other Services       Precautions / Restrictions Precautions Precautions: Fall;Other (comment) Recall of Precautions/Restrictions: Intact Restrictions Weight Bearing Restrictions Per Provider Order: No     Mobility  Bed Mobility Overal bed mobility: Needs Assistance Bed Mobility: Rolling Rolling: Max assist, Used rails         General bed mobility comments: Performed rolling to R and L side to perform pericare this session.  Pt required assistance for hand placement and moving hips into sidelying.    Transfers                        Ambulation/Gait                   Stairs             Wheelchair Mobility     Tilt Bed    Modified Rankin (Stroke Patients Only)        Balance                                            Communication Communication Communication: Impaired Factors Affecting Communication: Reduced clarity of speech  Cognition Arousal: Alert Behavior During Therapy: WFL for tasks assessed/performed   PT - Cognitive impairments: Problem solving, Sequencing, Memory, Safety/Judgement, Initiation                       PT - Cognition Comments: He refused to get OOB today but very motivated to do bed level tx.  He reports,I wish I could get up I just can't today. Following commands: Impaired Following commands impaired: Only follows one step commands consistently    Cueing Cueing Techniques: Verbal cues, Tactile cues, Visual cues  Exercises General Exercises - Lower Extremity Ankle Circles/Pumps: AROM, Both, 10 reps, Supine Quad Sets: AROM, Both, 10 reps, Supine (used pillow under knees as pt lacks terminal extension.) Hip ABduction/ADduction: AROM, Both, 10 reps, Supine (clam shell with AROM foot in hooklying)    General Comments        Pertinent Vitals/Pain Pain Assessment Pain Assessment: Faces Faces Pain Scale: Hurts little more Pain Location: B LEs and Buttocks  Pain Descriptors / Indicators: Grimacing, Discomfort Pain Intervention(s): Monitored during session, Repositioned    Home Living                          Prior Function            PT Goals (current goals can now be found in the care plan section) Acute Rehab PT Goals Patient Stated Goal: To improve strength and mobility to reduce risk for falls. Potential to Achieve Goals: Fair Progress towards PT goals: Progressing toward goals    Frequency    Min 1X/week      PT Plan      Co-evaluation              AM-PAC PT 6 Clicks Mobility   Outcome Measure  Help needed turning from your back to your side while in a flat bed without using bedrails?: A Lot Help needed moving from lying on your back to sitting on  the side of a flat bed without using bedrails?: Total Help needed moving to and from a bed to a chair (including a wheelchair)?: Total Help needed standing up from a chair using your arms (e.g., wheelchair or bedside chair)?: Total Help needed to walk in hospital room?: Total Help needed climbing 3-5 steps with a railing? : Total 6 Click Score: 7    End of Session   Activity Tolerance: Patient tolerated treatment well Patient left: with call bell/phone within reach;Other (comment);in bed Nurse Communication: Mobility status;Need for lift equipment PT Visit Diagnosis: Muscle weakness (generalized) (M62.81);Adult, failure to thrive (R62.7);Difficulty in walking, not elsewhere classified (R26.2);Pain;Other abnormalities of gait and mobility (R26.89) Pain - Right/Left: Left     Time: 1610-9604 PT Time Calculation (min) (ACUTE ONLY): 44 min  Charges:    $Therapeutic Exercise: 8-22 mins $Therapeutic Activity: 23-37 mins PT General Charges $$ ACUTE PT VISIT: 1 Visit                     Beulah Brunt , PTA Acute Rehabilitation Services Office [phone]    Jahmel Flannagan Winford Haus 05/03/2024, 3:13 PM

## 2024-05-04 DIAGNOSIS — A419 Sepsis, unspecified organism: Secondary | ICD-10-CM | POA: Diagnosis not present

## 2024-05-04 DIAGNOSIS — R000 Tachycardia, unspecified: Secondary | ICD-10-CM | POA: Diagnosis not present

## 2024-05-04 DIAGNOSIS — R6520 Severe sepsis without septic shock: Secondary | ICD-10-CM | POA: Diagnosis not present

## 2024-05-04 LAB — GLUCOSE, CAPILLARY
Glucose-Capillary: 130 mg/dL — ABNORMAL HIGH (ref 70–99)
Glucose-Capillary: 191 mg/dL — ABNORMAL HIGH (ref 70–99)
Glucose-Capillary: 190 mg/dL — ABNORMAL HIGH (ref 70–99)
Glucose-Capillary: 127 mg/dL — ABNORMAL HIGH (ref 70–99)
Glucose-Capillary: 193 mg/dL — ABNORMAL HIGH (ref 70–99)

## 2024-05-04 MED ORDER — ALBUMIN HUMAN 25 % IV SOLN
25.0000 g | Freq: Once | INTRAVENOUS | Status: AC
  Administered 2024-05-04: 25 g via INTRAVENOUS
  Filled 2024-05-04: qty 100

## 2024-05-04 MED ORDER — FUROSEMIDE 10 MG/ML IJ SOLN
40.0000 mg | Freq: Once | INTRAMUSCULAR | Status: AC
  Administered 2024-05-04: 40 mg via INTRAVENOUS
  Filled 2024-05-04: qty 4

## 2024-05-04 NOTE — Hospital Course (Addendum)
 83 y.o. 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

## 2024-05-04 NOTE — Plan of Care (Signed)
  Problem: Fluid Volume: Goal: Hemodynamic stability will improve Outcome: Progressing   Problem: Clinical Measurements: Goal: Diagnostic test results will improve Outcome: Progressing Goal: Signs and symptoms of infection will decrease Outcome: Progressing   Problem: Respiratory: Goal: Ability to maintain adequate ventilation will improve Outcome: Progressing   Problem: Education: Goal: Knowledge of risk factors and measures for prevention of condition will improve Outcome: Progressing   Problem: Coping: Goal: Psychosocial and spiritual needs will be supported Outcome: Progressing   Problem: Respiratory: Goal: Will maintain a patent airway Outcome: Progressing Goal: Complications related to the disease process, condition or treatment will be avoided or minimized Outcome: Progressing   Problem: Education: Goal: Ability to describe self-care measures that may prevent or decrease complications (Diabetes Survival Skills Education) will improve Outcome: Progressing   Problem: Coping: Goal: Ability to adjust to condition or change in health will improve Outcome: Progressing   Problem: Fluid Volume: Goal: Ability to maintain a balanced intake and output will improve Outcome: Progressing   Problem: Health Behavior/Discharge Planning: Goal: Ability to manage health-related needs will improve Outcome: Progressing   Problem: Metabolic: Goal: Ability to maintain appropriate glucose levels will improve Outcome: Progressing   Problem: Nutritional: Goal: Maintenance of adequate nutrition will improve Outcome: Progressing Goal: Progress toward achieving an optimal weight will improve Outcome: Progressing   Problem: Skin Integrity: Goal: Risk for impaired skin integrity will decrease Outcome: Progressing   Problem: Tissue Perfusion: Goal: Adequacy of tissue perfusion will improve Outcome: Progressing   Problem: Education: Goal: Knowledge of General Education information  will improve Description: Including pain rating scale, medication(s)/side effects and non-pharmacologic comfort measures Outcome: Progressing   Problem: Health Behavior/Discharge Planning: Goal: Ability to manage health-related needs will improve Outcome: Progressing   Problem: Clinical Measurements: Goal: Ability to maintain clinical measurements within normal limits will improve Outcome: Progressing Goal: Will remain free from infection Outcome: Progressing Goal: Diagnostic test results will improve Outcome: Progressing Goal: Respiratory complications will improve Outcome: Progressing Goal: Cardiovascular complication will be avoided Outcome: Progressing   Problem: Activity: Goal: Risk for activity intolerance will decrease Outcome: Progressing   Problem: Nutrition: Goal: Adequate nutrition will be maintained Outcome: Progressing   Problem: Coping: Goal: Level of anxiety will decrease Outcome: Progressing   Problem: Elimination: Goal: Will not experience complications related to bowel motility Outcome: Progressing Goal: Will not experience complications related to urinary retention Outcome: Progressing   Problem: Pain Managment: Goal: General experience of comfort will improve and/or be controlled Outcome: Progressing   Problem: Safety: Goal: Ability to remain free from injury will improve Outcome: Progressing   Problem: Skin Integrity: Goal: Risk for impaired skin integrity will decrease Outcome: Progressing

## 2024-05-04 NOTE — Progress Notes (Signed)
 Progress Note   Patient: Earl [name] [id number]:[id number] DOB: Apr 17, 1941 DOA: 03/04/2024     61 DOS: the patient was seen and examined on 05/04/2024   Brief hospital course: 83 yrs old male with past medical history of paroxysmal atrial fibrillation on Eliquis , insulin -dependent diabetes mellitus type 2, essential hypertension, CVA with right residual weakness, MGUS, BPH, HFr EF of 40%, He was discharged, recently from [facility] after admitted for acute metabolic encephalopathy, rhabdomyolysis and orthostatic, chronic pancytopenia presents to the ED from skilled nursing facility for shortness of breath, sacral pain, In the ED, he was found to be in RVR, tachypneic with saturations greater than 90%. Critical care was consulted for persistent hypotension. Respiratory panel was negative. General surgery was consulted for sacral wound debridement with bedside excision on 03/05/2024 and 03/07/2024. He is now hemodynamically stable for discharge.    6/4: Some concern of increasing discharge and some order from wound so wound care was reconsulted-repeat wound cultures were sent.  Daughter concerned of some shortness of breath which was not observed during rounds, chest x-ray with mild bibasilar subsegmental atelectasis or edema with small pleural effusions.  Remained on room air.   6/5:Hemodynamically stable.  Repeat cultures from wound yesterday with no organism.   6/6 - he is lying in bed, weak, does not wish to go to any facility.    6/7: Wound cultures 6/4 back with pseudomonas, klebsiella.    6/8: daughter at bedside, wishes he can be taken out of room for his birthday.    6/9- Seems more short of breath, feels dizzy, lightheaded. Daughter at bedside concerned about his abdominal distension, discomfort. Also has bilateral thigh swelling. Cardiology advised palliative, lasix  as needed.   6/10 - he felt better, blood sugars low overnight, insulin  stopped, started d5.    6/11 till 6/13 - Sugars  improved, eating fair. Restarted lantus , accucheks. On and off shortness of breath. Daughter asked about his wounds. SLP, WOC, Palliative involved.   Assessment and Plan: Severe sepsis - resolved On presentation, lactic acidosis, leukocytosis, fever, secondary to infected chronic wound.  Patient did receive multiple antibiotics during long hospital stay.  Sepsis now resolved.   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  Completed antibiotics 5/1.  General surgery now signed off.  Wound care following.  Infectious disease signed off.  Continue wound care management/regimen.  Offload pressure with every 2 hours turns, adequate nutrition.  Wound care as below -   1 - Left Hip PI unstageable Clean with Vashe ( lawson # [id number]), allow to air dry, apply santyl  to wound bed, cover with saline soaked gauze, and then cover with foam dressing, change daily  2 - Left shoulder unstageable Clean with Vashe ( lawson # [id number]), allow to air dry, apply santyl  to wound bed, cover with saline soaked gauze, and then cover with foam dressing, change daily 3 - Sacral Unstageable PI Cleanse with Vashe#[id number]. Apply 1/3 sheet of Aquacel Ag+ (silver hydrofiber) Lawson # [id number] in the wound bed and fill the tunneling using a swab, cover with sacral foam dressing, change daily and PRN soilage 4.Right heel DTPI Clean with Vashe ( lawson # [id number]), allow to air dry, apply santyl  to wound bed, cover with saline soaked gauze, and then cover with foam dressing, change daily Both feet should be placed in Prevalon boots to offload pressure Earl [name] [phone])  Repeat cultures were sent on 6/4 grew klebsiella, pseudomonas. Sensitivities reviewed. continue IV ceftazidime  x 7days as  per pharmacy protocol.   Acute on chronic pain - secondary to above Continue OxyContin  10mg  at bedtime, with oxycodone  2.5mg  q4hr prn.  Plan to wean off opioids entirely by discharge. On and off severe pain  requiring PRN meds.   Paroxysmal atrial fibrillation Continue amiodarone  and Eliquis .    HFrEF/cardiac amyloidosis/NICM On tafamadis for amyloid.  GDMT not in place secondary to hypotension.  Continue oral Lasix  20 mg daily.  Continue IV Lasix  as needed to relieve symptoms (sob, abdominal wall, thigh edema). Cardiology did follow him 6/9 discussed with daughter- he has end stage CHF and prognosis is poor. He could not tolerate GDMT. Daughter aware of his poor condition, prognosis. Palliative on board for pain control and goals of care discussion. Remains grossly overloaded including scrotal swelling. Will give dose of IVlasix 40gm with 25g albumin    Insulin -dependent diabetes mellitus Blood sugars better today. Continue semglee  8 units, accucheks, sliding scale insulin . Monitor for hypoglycemia   Anemia of chronic disease S/p 1 unit PRBC 4/30.  Hemoglobin stable, no active bleeding.   Dysphagia/severe protein calorie malnutrition Continue 3 times daily protein calorie supplementation.  Vitamin supplementation.  Continues on pured diet.  Daughter noted that he has improvement in appetite.     Physical debilitation of muscle weakness PT/ OT working with him.  However, limited ability to sit upright in chair secondary to pain from sacral ulcer.   Continue frequent repositioning.   Goals of care Patient is medically appropriate for discharge at this time. He is DNR-limited intervention. His overall prognosis is poor. Patient would like to continue current treatment, refuses to go to any facility and wishes to go home. Daughter wishes transition to home care once his wounds are better.  However given his debility, poor prognosis, SNF for full time care would be appropriate.  TOC working with admin regarding safe disposition. Palliative follow up on goals of care, pain control.         Subjective: Complaining of swelling in thighs and scrotum, asking for lasix   Physical  Exam: Vitals:   05/03/24 2013 05/04/24 0535 05/04/24 0833 05/04/24 1310  BP: 126/81 129/67 131/68 136/67  Pulse: 79 72 75 78  Resp: 18 18 18    Temp: 97.7 F (36.5 C) 97.7 F (36.5 C) (!) 97.5 F (36.4 C)   TempSrc: Oral Oral    SpO2: 100% 100% 100% 100%  Weight:      Height:       General exam: Awake, laying in bed, in nad Respiratory system: Normal respiratory effort, no wheezing Cardiovascular system: regular rate, s1, s2 Gastrointestinal system: Soft, nondistended, positive BS Central nervous system: CN2-12 grossly intact, strength intact Extremities: Perfused, no clubbing Skin: Normal skin turgor, LE edema and scrotal edema Psychiatry: Mood normal // no visual hallucinations   Data Reviewed:  There are no new results to review at this time.  Family Communication: Pt in room, family at bedside  Disposition: Status is: Inpatient Remains inpatient appropriate because: severity of illness  Planned Discharge Destination: Home     Author: Cherylle Corwin, MD 05/04/2024 3:37 PM  For on call review [email or web address].

## 2024-05-05 ENCOUNTER — Inpatient Hospital Stay (HOSPITAL_COMMUNITY)

## 2024-05-05 DIAGNOSIS — R6520 Severe sepsis without septic shock: Secondary | ICD-10-CM | POA: Diagnosis not present

## 2024-05-05 DIAGNOSIS — R000 Tachycardia, unspecified: Secondary | ICD-10-CM | POA: Diagnosis not present

## 2024-05-05 DIAGNOSIS — A419 Sepsis, unspecified organism: Secondary | ICD-10-CM | POA: Diagnosis not present

## 2024-05-05 LAB — COMPREHENSIVE METABOLIC PANEL WITH GFR
ALT: 32 U/L (ref 0–44)
AST: 35 U/L (ref 15–41)
Albumin: 2.7 g/dL — ABNORMAL LOW (ref 3.5–5.0)
Alkaline Phosphatase: 110 U/L (ref 38–126)
Anion gap: 13 (ref 5–15)
BUN: 38 mg/dL — ABNORMAL HIGH (ref 8–23)
CO2: 23 mmol/L (ref 22–32)
Calcium: 8.8 mg/dL — ABNORMAL LOW (ref 8.9–10.3)
Chloride: 107 mmol/L (ref 98–111)
Creatinine, Ser: 0.92 mg/dL (ref 0.61–1.24)
GFR, Estimated: >60 mL/min (ref >60)
Glucose, Bld: 195 mg/dL — ABNORMAL HIGH (ref 70–99)
Potassium: 3.6 mmol/L (ref 3.5–5.1)
Sodium: 143 mmol/L (ref 135–145)
Total Bilirubin: 0.8 mg/dL (ref 0.0–1.2)
Total Protein: 5.9 g/dL — ABNORMAL LOW (ref 6.5–8.1)

## 2024-05-05 LAB — CBC
HCT: 33.1 % — ABNORMAL LOW (ref 39.0–52.0)
Hemoglobin: 10.6 g/dL — ABNORMAL LOW (ref 13.0–17.0)
MCH: 28.6 pg (ref 26.0–34.0)
MCHC: 32 g/dL (ref 30.0–36.0)
MCV: 89.2 fL (ref 80.0–100.0)
Platelets: 250 10*3/uL (ref 150–400)
RBC: 3.71 MIL/uL — ABNORMAL LOW (ref 4.22–5.81)
RDW: 17.8 % — ABNORMAL HIGH (ref 11.5–15.5)
WBC: 6.3 10*3/uL (ref 4.0–10.5)
nRBC: 0 % (ref 0.0–0.2)

## 2024-05-05 LAB — GLUCOSE, CAPILLARY
Glucose-Capillary: 158 mg/dL — ABNORMAL HIGH (ref 70–99)
Glucose-Capillary: 142 mg/dL — ABNORMAL HIGH (ref 70–99)
Glucose-Capillary: 204 mg/dL — ABNORMAL HIGH (ref 70–99)
Glucose-Capillary: 206 mg/dL — ABNORMAL HIGH (ref 70–99)
Glucose-Capillary: 185 mg/dL — ABNORMAL HIGH (ref 70–99)
Glucose-Capillary: 252 mg/dL — ABNORMAL HIGH (ref 70–99)

## 2024-05-05 MED ORDER — FUROSEMIDE 10 MG/ML IJ SOLN
40.0000 mg | Freq: Every day | INTRAMUSCULAR | Status: DC
  Administered 2024-05-05 – 2024-05-06 (×2): 40 mg via INTRAVENOUS
  Filled 2024-05-05 (×2): qty 4

## 2024-05-05 MED ORDER — ALBUMIN HUMAN 25 % IV SOLN
25.0000 g | Freq: Once | INTRAVENOUS | Status: AC
  Administered 2024-05-05: 25 g via INTRAVENOUS
  Filled 2024-05-05: qty 100

## 2024-05-05 NOTE — Plan of Care (Signed)
°  Problem: Fluid Volume: °Goal: Hemodynamic stability will improve °Outcome: Progressing °  °Problem: Clinical Measurements: °Goal: Diagnostic test results will improve °Outcome: Progressing °Goal: Signs and symptoms of infection will decrease °Outcome: Progressing °  °

## 2024-05-05 NOTE — Progress Notes (Signed)
 Progress Note   Patient: Earl [name] JXB:[id number] DOB: 03-02-1941 DOA: 03/04/2024     62 DOS: the patient was seen and examined on 05/05/2024   Brief hospital course: 83 yrs old male with past medical history of paroxysmal atrial fibrillation on Eliquis , insulin -dependent diabetes mellitus type 2, essential hypertension, CVA with right residual weakness, MGUS, BPH, HFr EF of 40%, He was discharged, recently from [facility] after admitted for acute metabolic encephalopathy, rhabdomyolysis and orthostatic, chronic pancytopenia presents to the ED from skilled nursing facility for shortness of breath, sacral pain, In the ED, he was found to be in RVR, tachypneic with saturations greater than 90%. Critical care was consulted for persistent hypotension. Respiratory panel was negative. General surgery was consulted for sacral wound debridement with bedside excision on 03/05/2024 and 03/07/2024. He is now hemodynamically stable for discharge.    6/4: Some concern of increasing discharge and some order from wound so wound care was reconsulted-repeat wound cultures were sent.  Daughter concerned of some shortness of breath which was not observed during rounds, chest x-ray with mild bibasilar subsegmental atelectasis or edema with small pleural effusions.  Remained on room air.   6/5:Hemodynamically stable.  Repeat cultures from wound yesterday with no organism.   6/6 - he is lying in bed, weak, does not wish to go to any facility.    6/7: Wound cultures 6/4 back with pseudomonas, klebsiella.    6/8: daughter at bedside, wishes he can be taken out of room for his birthday.    6/9- Seems more short of breath, feels dizzy, lightheaded. Daughter at bedside concerned about his abdominal distension, discomfort. Also has bilateral thigh swelling. Cardiology advised palliative, lasix  as needed.   6/10 - he felt better, blood sugars low overnight, insulin  stopped, started d5.    6/11 till 6/13 - Sugars  improved, eating fair. Restarted lantus , accucheks. On and off shortness of breath. Daughter asked about his wounds. SLP, WOC, Palliative involved.   Assessment and Plan: Severe sepsis - resolved On presentation, lactic acidosis, leukocytosis, fever, secondary to infected chronic wound.  Patient did receive multiple antibiotics during long hospital stay.  Sepsis now resolved.   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  Completed antibiotics 5/1.  General surgery now signed off.  Wound care following.  Infectious disease signed off.  Continue wound care management/regimen.  Offload pressure with every 2 hours turns, adequate nutrition.  Wound care as below -   1 - Left Hip PI unstageable Clean with Vashe ( lawson # [id number]), allow to air dry, apply santyl  to wound bed, cover with saline soaked gauze, and then cover with foam dressing, change daily  2 - Left shoulder unstageable Clean with Vashe ( lawson # [id number]), allow to air dry, apply santyl  to wound bed, cover with saline soaked gauze, and then cover with foam dressing, change daily 3 - Sacral Unstageable PI Cleanse with Vashe#[id number]. Apply 1/3 sheet of Aquacel Ag+ (silver hydrofiber) Lawson # [id number] in the wound bed and fill the tunneling using a swab, cover with sacral foam dressing, change daily and PRN soilage 4.Right heel DTPI Clean with Vashe ( lawson # [id number]), allow to air dry, apply santyl  to wound bed, cover with saline soaked gauze, and then cover with foam dressing, change daily Both feet should be placed in Prevalon boots to offload pressure Timm Foot [phone])  Repeat cultures were sent on 6/4 grew klebsiella, pseudomonas. Sensitivities reviewed. continue IV ceftazidime  x 7days as  per pharmacy protocol.   Acute on chronic pain - secondary to above Continue OxyContin  10mg  at bedtime, with oxycodone  2.5mg  q4hr prn.  Had been planning to wean off opioids entirely by discharge. On and off  severe pain requiring PRN meds. -today grimacing with pain across the abd -Bladder scan yielding 157cc -ordered and reviewed abd xray, unremarkable -Consider f/u abd CT if renal function remains unremarkable   Paroxysmal atrial fibrillation Continue amiodarone  and Eliquis .    HFrEF/cardiac amyloidosis/NICM On tafamadis for amyloid.  GDMT not in place secondary to hypotension.  Continue oral Lasix  20 mg daily.  Continue IV Lasix  as needed to relieve symptoms (sob, abdominal wall, thigh edema). Cardiology did follow him 6/9 discussed with daughter- he has end stage CHF and prognosis is poor. He could not tolerate GDMT. Daughter aware of his poor condition, prognosis. Palliative on board for pain control and goals of care discussion. Remains grossly overloaded including scrotal swelling. Tolerated 40mg  IV lasix  on 6/14. Will continue on scheduled 40mg  IV lasix  as tolerated   Insulin -dependent diabetes mellitus Continue semglee  8 units, accucheks, sliding scale insulin . Monitor for hypoglycemia   Anemia of chronic disease S/p 1 unit PRBC 4/30.  Hemoglobin stable, no active bleeding.   Dysphagia/severe protein calorie malnutrition Continue 3 times daily protein calorie supplementation.  Vitamin supplementation.  Continues on pured diet.  Daughter noted that he has improvement in appetite.     Physical debilitation of muscle weakness PT/ OT working with him.  However, limited ability to sit upright in chair secondary to pain from sacral ulcer.   Continue frequent repositioning.   Goals of care Patient is medically appropriate for discharge at this time. He is DNR-limited intervention. His overall prognosis is poor. Patient would like to continue current treatment, refuses to go to any facility and wishes to go home. Daughter wishes transition to home care once his wounds are better.  However given his debility, poor prognosis, SNF for full time care would be appropriate.  TOC working with  admin regarding safe disposition. Palliative follow up on goals of care, pain control.         Subjective: Complaining of abd pains  Physical Exam: Vitals:   05/05/24 0749 05/05/24 1139 05/05/24 1538 05/05/24 1631  BP: 119/77 (!) 115/58 119/64 114/71  Pulse: 77 74  88  Resp: 18     Temp: (!) 97.4 F (36.3 C) 97.8 F (36.6 C)  (!) 97.5 F (36.4 C)  TempSrc:      SpO2: 100% 95%  100%  Weight:      Height:       General exam: Conversant, appears in pain with grimacing Respiratory system: normal chest rise, clear, no audible wheezing Cardiovascular system: regular rhythm, s1-s2 Gastrointestinal system: Tenderness on palpation, edematous Central nervous system: No seizures, no tremors Extremities: No cyanosis, no joint deformities Skin: No rashes, no pallor Psychiatry: Affect normal // mood seems normal  Data Reviewed:  There are no new results to review at this time.  Family Communication: Pt in room, family at bedside  Disposition: Status is: Inpatient Remains inpatient appropriate because: severity of illness  Planned Discharge Destination: Home     Author: Cherylle Corwin, MD 05/05/2024 4:34 PM  For on call review [email or web address].

## 2024-05-05 NOTE — Plan of Care (Signed)
  Problem: Fluid Volume: Goal: Hemodynamic stability will improve Outcome: Progressing   Problem: Clinical Measurements: Goal: Diagnostic test results will improve Outcome: Progressing Goal: Signs and symptoms of infection will decrease Outcome: Progressing   Problem: Respiratory: Goal: Ability to maintain adequate ventilation will improve Outcome: Progressing   Problem: Education: Goal: Knowledge of risk factors and measures for prevention of condition will improve Outcome: Progressing   Problem: Coping: Goal: Psychosocial and spiritual needs will be supported Outcome: Progressing   Problem: Respiratory: Goal: Will maintain a patent airway Outcome: Progressing Goal: Complications related to the disease process, condition or treatment will be avoided or minimized Outcome: Progressing   Problem: Education: Goal: Ability to describe self-care measures that may prevent or decrease complications (Diabetes Survival Skills Education) will improve Outcome: Progressing   Problem: Coping: Goal: Ability to adjust to condition or change in health will improve Outcome: Progressing   Problem: Fluid Volume: Goal: Ability to maintain a balanced intake and output will improve Outcome: Progressing   Problem: Health Behavior/Discharge Planning: Goal: Ability to manage health-related needs will improve Outcome: Progressing   Problem: Metabolic: Goal: Ability to maintain appropriate glucose levels will improve Outcome: Progressing   Problem: Nutritional: Goal: Maintenance of adequate nutrition will improve Outcome: Progressing Goal: Progress toward achieving an optimal weight will improve Outcome: Progressing   Problem: Skin Integrity: Goal: Risk for impaired skin integrity will decrease Outcome: Progressing   Problem: Tissue Perfusion: Goal: Adequacy of tissue perfusion will improve Outcome: Progressing   Problem: Education: Goal: Knowledge of General Education information  will improve Description: Including pain rating scale, medication(s)/side effects and non-pharmacologic comfort measures Outcome: Progressing   Problem: Health Behavior/Discharge Planning: Goal: Ability to manage health-related needs will improve Outcome: Progressing   Problem: Clinical Measurements: Goal: Ability to maintain clinical measurements within normal limits will improve Outcome: Progressing Goal: Will remain free from infection Outcome: Progressing Goal: Diagnostic test results will improve Outcome: Progressing Goal: Respiratory complications will improve Outcome: Progressing Goal: Cardiovascular complication will be avoided Outcome: Progressing   Problem: Activity: Goal: Risk for activity intolerance will decrease Outcome: Progressing   Problem: Nutrition: Goal: Adequate nutrition will be maintained Outcome: Progressing   Problem: Coping: Goal: Level of anxiety will decrease Outcome: Progressing   Problem: Elimination: Goal: Will not experience complications related to bowel motility Outcome: Progressing Goal: Will not experience complications related to urinary retention Outcome: Progressing   Problem: Pain Managment: Goal: General experience of comfort will improve and/or be controlled Outcome: Progressing   Problem: Safety: Goal: Ability to remain free from injury will improve Outcome: Progressing   Problem: Skin Integrity: Goal: Risk for impaired skin integrity will decrease Outcome: Progressing

## 2024-05-06 ENCOUNTER — Inpatient Hospital Stay (HOSPITAL_COMMUNITY)

## 2024-05-06 DIAGNOSIS — I4891 Unspecified atrial fibrillation: Secondary | ICD-10-CM | POA: Diagnosis not present

## 2024-05-06 DIAGNOSIS — R6520 Severe sepsis without septic shock: Secondary | ICD-10-CM | POA: Diagnosis not present

## 2024-05-06 DIAGNOSIS — A419 Sepsis, unspecified organism: Secondary | ICD-10-CM | POA: Diagnosis not present

## 2024-05-06 DIAGNOSIS — R000 Tachycardia, unspecified: Secondary | ICD-10-CM | POA: Diagnosis not present

## 2024-05-06 DIAGNOSIS — Z515 Encounter for palliative care: Secondary | ICD-10-CM | POA: Diagnosis not present

## 2024-05-06 LAB — CBC
HCT: 33.1 % — ABNORMAL LOW (ref 39.0–52.0)
Hemoglobin: 10.4 g/dL — ABNORMAL LOW (ref 13.0–17.0)
MCH: 28.2 pg (ref 26.0–34.0)
MCHC: 31.4 g/dL (ref 30.0–36.0)
MCV: 89.7 fL (ref 80.0–100.0)
Platelets: 208 10*3/uL (ref 150–400)
RBC: 3.69 MIL/uL — ABNORMAL LOW (ref 4.22–5.81)
RDW: 17.8 % — ABNORMAL HIGH (ref 11.5–15.5)
WBC: 6.8 10*3/uL (ref 4.0–10.5)
nRBC: 0 % (ref 0.0–0.2)

## 2024-05-06 LAB — COMPREHENSIVE METABOLIC PANEL WITH GFR
ALT: 33 U/L (ref 0–44)
AST: 32 U/L (ref 15–41)
Albumin: 2.5 g/dL — ABNORMAL LOW (ref 3.5–5.0)
Alkaline Phosphatase: 108 U/L (ref 38–126)
Anion gap: 9 (ref 5–15)
BUN: 35 mg/dL — ABNORMAL HIGH (ref 8–23)
CO2: 27 mmol/L (ref 22–32)
Calcium: 8.7 mg/dL — ABNORMAL LOW (ref 8.9–10.3)
Chloride: 108 mmol/L (ref 98–111)
Creatinine, Ser: 0.97 mg/dL (ref 0.61–1.24)
GFR, Estimated: >60 mL/min (ref >60)
Glucose, Bld: 136 mg/dL — ABNORMAL HIGH (ref 70–99)
Potassium: 3.4 mmol/L — ABNORMAL LOW (ref 3.5–5.1)
Sodium: 144 mmol/L (ref 135–145)
Total Bilirubin: 0.8 mg/dL (ref 0.0–1.2)
Total Protein: 5.7 g/dL — ABNORMAL LOW (ref 6.5–8.1)

## 2024-05-06 LAB — GLUCOSE, CAPILLARY
Glucose-Capillary: 157 mg/dL — ABNORMAL HIGH (ref 70–99)
Glucose-Capillary: 185 mg/dL — ABNORMAL HIGH (ref 70–99)
Glucose-Capillary: 137 mg/dL — ABNORMAL HIGH (ref 70–99)
Glucose-Capillary: 174 mg/dL — ABNORMAL HIGH (ref 70–99)

## 2024-05-06 MED ORDER — FUROSEMIDE 10 MG/ML IJ SOLN
40.0000 mg | Freq: Two times a day (BID) | INTRAMUSCULAR | Status: DC
  Administered 2024-05-06 – 2024-05-11 (×11): 40 mg via INTRAVENOUS
  Filled 2024-05-06 (×12): qty 4

## 2024-05-06 MED ORDER — OXYCODONE HCL 5 MG/5ML PO SOLN
5.0000 mg | Freq: Every day | ORAL | Status: DC
  Administered 2024-05-06 – 2024-05-09 (×4): 5 mg via ORAL
  Filled 2024-05-06 (×4): qty 5

## 2024-05-06 MED ORDER — ALBUMIN HUMAN 25 % IV SOLN
25.0000 g | Freq: Once | INTRAVENOUS | Status: AC
  Administered 2024-05-06: 25 g via INTRAVENOUS
  Filled 2024-05-06: qty 100

## 2024-05-06 MED ORDER — IOHEXOL 350 MG/ML SOLN
75.0000 mL | Freq: Once | INTRAVENOUS | Status: AC | PRN
  Administered 2024-05-06: 75 mL via INTRAVENOUS

## 2024-05-06 MED ORDER — SERTRALINE HCL 25 MG PO TABS
25.0000 mg | ORAL_TABLET | Freq: Every day | ORAL | Status: DC
  Administered 2024-05-07 – 2024-05-23 (×17): 25 mg via ORAL
  Filled 2024-05-06 (×17): qty 1

## 2024-05-06 MED ORDER — SERTRALINE HCL 20 MG/ML PO CONC
25.0000 mg | Freq: Every day | ORAL | Status: DC
  Filled 2024-05-06 (×2): qty 1.25

## 2024-05-06 NOTE — Progress Notes (Signed)
 PT Cancellation Note  Patient Details Name: Earl [name] MRN: [id number] DOB: [birth year]   Cancelled Treatment:    Reason Eval/Treat Not Completed: Patient at procedure or test/unavailable   Pura Browns [facility] 05/06/2024, 12:41 PM Angelina Kempf PT Acute Rehabilitation Services Office [phone]

## 2024-05-06 NOTE — Progress Notes (Signed)
 Progress Note   Patient: Earl [name] ZOX:[id number] DOB: Aug 01, 1941 DOA: 03/04/2024     63 DOS: the patient was seen and examined on 05/06/2024   Brief hospital course: 83 yrs old male with past medical history of paroxysmal atrial fibrillation on Eliquis , insulin -dependent diabetes mellitus type 2, essential hypertension, CVA with right residual weakness, MGUS, BPH, HFr EF of 40%, He was discharged, recently from [facility] after admitted for acute metabolic encephalopathy, rhabdomyolysis and orthostatic, chronic pancytopenia presents to the ED from skilled nursing facility for shortness of breath, sacral pain, In the ED, he was found to be in RVR, tachypneic with saturations greater than 90%. Critical care was consulted for persistent hypotension. Respiratory panel was negative. General surgery was consulted for sacral wound debridement with bedside excision on 03/05/2024 and 03/07/2024. He is now hemodynamically stable for discharge.    6/4: Some concern of increasing discharge and some order from wound so wound care was reconsulted-repeat wound cultures were sent.  Daughter concerned of some shortness of breath which was not observed during rounds, chest x-ray with mild bibasilar subsegmental atelectasis or edema with small pleural effusions.  Remained on room air.   6/5:Hemodynamically stable.  Repeat cultures from wound yesterday with no organism.   6/6 - he is lying in bed, weak, does not wish to go to any facility.    6/7: Wound cultures 6/4 back with pseudomonas, klebsiella.    6/8: daughter at bedside, wishes he can be taken out of room for his birthday.    6/9- Seems more short of breath, feels dizzy, lightheaded. Daughter at bedside concerned about his abdominal distension, discomfort. Also has bilateral thigh swelling. Cardiology advised palliative, lasix  as needed.   6/10 - he felt better, blood sugars low overnight, insulin  stopped, started d5.    6/11 till 6/13 - Sugars  improved, eating fair. Restarted lantus , accucheks. On and off shortness of breath. Daughter asked about his wounds. SLP, WOC, Palliative involved.   Assessment and Plan: Severe sepsis - resolved On presentation, lactic acidosis, leukocytosis, fever, secondary to infected chronic wound.  Patient did receive multiple antibiotics during long hospital stay.  Sepsis now resolved.   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  Completed antibiotics 5/1.  General surgery now signed off.  Wound care following.  Infectious disease signed off.  Continue wound care management/regimen.  Offload pressure with every 2 hours turns, adequate nutrition.  Wound care as below -   1 - Left Hip PI unstageable Clean with Vashe ( lawson # [id number]), allow to air dry, apply santyl  to wound bed, cover with saline soaked gauze, and then cover with foam dressing, change daily  2 - Left shoulder unstageable Clean with Vashe ( lawson # [id number]), allow to air dry, apply santyl  to wound bed, cover with saline soaked gauze, and then cover with foam dressing, change daily 3 - Sacral Unstageable PI Cleanse with Vashe#[id number]. Apply 1/3 sheet of Aquacel Ag+ (silver hydrofiber) Lawson # [id number] in the wound bed and fill the tunneling using a swab, cover with sacral foam dressing, change daily and PRN soilage 4.Right heel DTPI Clean with Vashe ( lawson # [id number]), allow to air dry, apply santyl  to wound bed, cover with saline soaked gauze, and then cover with foam dressing, change daily Both feet should be placed in Prevalon boots to offload pressure Timm Foot [phone])  Repeat cultures were sent on 6/4 grew klebsiella, pseudomonas. Sensitivities reviewed. Pt completed 7 days of ceftazidime   that was started on 6/7 -6/16 CT abd incidentally has findings suggestive of sacral osteomyelitis. Will discuss with ID regarding recs   Acute on chronic pain - secondary to above Continue OxyContin  10mg  at  bedtime, with oxycodone  2.5mg  q4hr prn.  Had been planning to wean off opioids entirely by discharge. On and off severe pain requiring PRN meds. -continues with lower abd pain -Ordered and reviewed CT abd/pelvis. Findings of gross vol overload and third spacing as well as mod B pleural effusions -Increased lasix  to 40mg  IV BID with another dose of IV albumin    Paroxysmal atrial fibrillation Continue amiodarone  and Eliquis .    HFrEF/cardiac amyloidosis/NICM On tafamadis for amyloid.  GDMT not in place secondary to hypotension.  Continue oral Lasix  20 mg daily.  Continue IV Lasix  as needed to relieve symptoms (sob, abdominal wall, thigh edema). Cardiology did follow him 6/9 discussed with daughter- he has end stage CHF and prognosis is poor. He could not tolerate GDMT. Daughter aware of his poor condition, prognosis. Palliative on board for pain control and goals of care discussion. Remains grossly overloaded including scrotal swelling. CT abd/pelvis on 6/16 also with gross vol overload -Increased IV lasix  to 40mg  BID with dose of albumin . Pt's BP is tolerating thus far   Insulin -dependent diabetes mellitus Continue semglee  8 units, accucheks, sliding scale insulin . Monitor for hypoglycemia   Anemia of chronic disease S/p 1 unit PRBC 4/30.  Hemoglobin stable, no active bleeding.   Dysphagia/severe protein calorie malnutrition Continue 3 times daily protein calorie supplementation.  Vitamin supplementation.  Continues on pured diet.  Daughter noted that he has improvement in appetite.     Physical debilitation of muscle weakness PT/ OT working with him.  However, limited ability to sit upright in chair secondary to pain from sacral ulcer.   Continue frequent repositioning.   Goals of care Patient is medically appropriate for discharge at this time. He is DNR-limited intervention. His overall prognosis is poor. Patient would like to continue current treatment, refuses to go to any  facility and wishes to go home. Daughter wishes transition to home care once his wounds are better.  However given his debility, poor prognosis, SNF for full time care would be appropriate.  TOC working with admin regarding safe disposition. Palliative follow up on goals of care, pain control.   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 abd/pelvis -Pt recently completed 7 day course of ceftazidime   -Reaching out to ID for recs      Subjective: Still complaining of RLQ abd pains  Physical Exam: Vitals:   05/06/24 0449 05/06/24 0456 05/06/24 0751 05/06/24 1100  BP: 134/69  137/74 128/75  Pulse: 79  86 78  Resp: 18  19   Temp: (!) 97.4 F (36.3 C)  98.4 F (36.9 C) 97.6 F (36.4 C)  TempSrc:    Oral  SpO2: 100%  97% 100%  Weight:  86.2 kg    Height:       General exam: Awake, laying in bed, in nad Respiratory system: Normal respiratory effort, no wheezing Cardiovascular system: regular rate, s1, s2 Gastrointestinal system: Soft, nondistended, positive BS Central nervous system: CN2-12 grossly intact, strength intact Extremities: Perfused, no clubbing Skin: Normal skin turgor, no notable skin lesions seen Psychiatry: Mood normal // no visual hallucinations   Data Reviewed:  Labs reviewed: Na 144, K 3.4, Cr 0.97, WBC 6.8, hgb 10.4, Plts 208  Family Communication: Pt in room, family not at bedside  Disposition: Status is: Inpatient Remains  inpatient appropriate because: severity of illness  Planned Discharge Destination: Home     Author: Cherylle Corwin, MD 05/06/2024 2:32 PM  For on call review [email or web address].

## 2024-05-06 NOTE — Progress Notes (Signed)
 Daily Progress Note   Patient Name: Earl [name]       Date: 05/01/2024 DOB: 02/28/1941  Age: 83 y.o. MRN#: [id number] Attending Physician: Aisha Hove, MD Primary Care Physician: [name], MD Admit Date: 03/04/2024  Reason for Consultation/Follow-up: Establishing goals of care  Subjective: Medical records reviewed including progress notes, labs, imaging, MAR. Patient assessed at the bedside. He reports abdominal pain and feelings of distress because he can't eat. Pain is worse in RLQ. No family was present during my visit.  Created space and opportunity for patient and family's thoughts and feelings on patient's current illness. He tells me he remains agreeable to previously discussed plan to wean him off opioids. He would like to discuss when his daughter comes later today.   Returned to the bedside to meet with patient and his daughter. Discussed concerns regarding ongoing anxiety and debriefed ongoing medical workup and treatment plan. Goals remain clear for as much improvement as possible and patient/family are not ready for hospice. Recently he was able to meet a goal of a family graduation (virtually) and his birthday. Expecting difficulty with the summer months due to anniversaries (wedding and death).  Para has noticed some personality changes and we had a good conversation about patient's anxiety, which he never had an issue with prior to this illness. Patient and his daugher are agreeable to starting trial of SSRI.   Questions and concerns addressed. PMT will continue to support holistically.   Length of Stay: 14   Physical Exam Vitals and nursing note reviewed.  Constitutional:      General: He is not in acute distress.    Appearance: He is ill-appearing.   Cardiovascular:     Rate and Rhythm:  Normal rate.  Pulmonary:     Effort: Pulmonary effort is normal.   Skin:    General: Skin is warm and dry.   Neurological:     Mental Status: He is alert.   Psychiatric:        Behavior: Behavior normal. Behavior is cooperative.            Vital Signs: BP 130/65 (BP Location: Right Arm)   Pulse 67   Temp 98.4 F (36.9 C)   Resp 20   Ht 5' 11 (1.803 m)   Wt 85.7 kg   SpO2 100%   BMI 26.36 kg/m  SpO2: SpO2: 100 % O2 Device: O2 Device: Room Air O2 Flow Rate:        Palliative Assessment/Data: 20% to 30%   Palliative Care Assessment & Plan   Patient Profile: Per intake H&P --> Patient is a 83 year old male with history of paroxysmal A-fib, insulin -dependent diabetes, hypertension, hyperlipidemia, CVA with residual right-sided weakness, MGUS, GERD, BPH, hypothyroidism, HFrEF with EF of 40 to 45%, chronic pancytopenia who presented to the emergency department from SNF with complaint of shortness of breath, wound, pain in the sacral area.    Palliative care evaluated Es[place] earlier this month and have been asked to get re-involved for additional goals of care conversations.   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 Recent COVID infection Severe  protein calorie malnutrition  Recommendations/Plan: Continue DNR/DNI Continue current care plan, goals of care remain consistent for medical optimization and return home to continue managing chronic illnesses Discontinue Oxycontin  and scheduled Oxycodone  5mg  PO at bedtime. Continue Oxycodone  2.5mg  PO Q4H PRN for breakthrough pain  Scheduled Sertraline 25mg  PO daily Ongoing symptom management and palliative support   Prognosis: Concerning prognosis in light of recurrent hospitalizations, declining functional/nutritional status, and several chronic comorbidities  Discharge  Planning: Skilled Nursing Facility for rehab with Palliative care service follow-up   Care plan was discussed with patient, patient's daughter, MD   Angeline Kemps, PA-C  Palliative Medicine Team Team phone # [phone]  Thank you for allowing the Palliative Medicine Team to assist in the care of this patient. Please utilize secure chat with additional questions, if there is no response within 30 minutes please call the above phone number.  Palliative Medicine Team providers are available by phone from 7am to 7pm daily and can be reached through the team cell phone.  Should this patient require assistance outside of these hours, please call the patient's attending physician.     Time Total: 50  Visit consisted of counseling and education dealing with the complex and emotionally intense issues of symptom management and palliative care in the setting of serious and potentially life-threatening illness. Greater than 50% of this time was spent counseling and coordinating care related to the above assessment and plan.  Personally spent 50 minutes in patient care including extensive chart review (labs, imaging, progress/consult notes, vital signs), medically appropraite exam, discussed with treatment team, education to patient, family, and staff, documenting clinical information, medication review and management, coordination of care, and available advanced directive documents.

## 2024-05-06 NOTE — Progress Notes (Signed)
 PT Cancellation Note  Patient Details Name: Earl [name] MRN: [id number] DOB: 1941/04/19   Cancelled Treatment:    Reason Eval/Treat Not Completed: Fatigue limiting ability to participate. Pt sleeping soundly and not arousing to voice/touch. Family reports he is very tired after CT earlier today. Will try again at later date.   Pura Browns [facility] 05/06/2024, 2:48 PM Angelina Kempf PT Acute [employer] [phone]

## 2024-05-07 DIAGNOSIS — Z515 Encounter for palliative care: Secondary | ICD-10-CM | POA: Diagnosis not present

## 2024-05-07 DIAGNOSIS — A4189 Other specified sepsis: Secondary | ICD-10-CM | POA: Diagnosis not present

## 2024-05-07 DIAGNOSIS — R000 Tachycardia, unspecified: Secondary | ICD-10-CM | POA: Diagnosis not present

## 2024-05-07 DIAGNOSIS — I4891 Unspecified atrial fibrillation: Secondary | ICD-10-CM | POA: Diagnosis not present

## 2024-05-07 DIAGNOSIS — A419 Sepsis, unspecified organism: Secondary | ICD-10-CM | POA: Diagnosis not present

## 2024-05-07 DIAGNOSIS — E43 Unspecified severe protein-calorie malnutrition: Secondary | ICD-10-CM | POA: Diagnosis not present

## 2024-05-07 DIAGNOSIS — R6520 Severe sepsis without septic shock: Secondary | ICD-10-CM | POA: Diagnosis not present

## 2024-05-07 DIAGNOSIS — Z66 Do not resuscitate: Secondary | ICD-10-CM | POA: Diagnosis not present

## 2024-05-07 LAB — CBC
HCT: 34.4 % — ABNORMAL LOW (ref 39.0–52.0)
Hemoglobin: 10.9 g/dL — ABNORMAL LOW (ref 13.0–17.0)
MCH: 28.5 pg (ref 26.0–34.0)
MCHC: 31.7 g/dL (ref 30.0–36.0)
MCV: 89.8 fL (ref 80.0–100.0)
Platelets: 233 10*3/uL (ref 150–400)
RBC: 3.83 MIL/uL — ABNORMAL LOW (ref 4.22–5.81)
RDW: 18 % — ABNORMAL HIGH (ref 11.5–15.5)
WBC: 6.7 10*3/uL (ref 4.0–10.5)
nRBC: 0 % (ref 0.0–0.2)

## 2024-05-07 LAB — COMPREHENSIVE METABOLIC PANEL WITH GFR
ALT: 34 U/L (ref 0–44)
AST: 32 U/L (ref 15–41)
Albumin: 2.7 g/dL — ABNORMAL LOW (ref 3.5–5.0)
Alkaline Phosphatase: 112 U/L (ref 38–126)
Anion gap: 10 (ref 5–15)
BUN: 35 mg/dL — ABNORMAL HIGH (ref 8–23)
CO2: 25 mmol/L (ref 22–32)
Calcium: 8.6 mg/dL — ABNORMAL LOW (ref 8.9–10.3)
Chloride: 108 mmol/L (ref 98–111)
Creatinine, Ser: 1 mg/dL (ref 0.61–1.24)
GFR, Estimated: >60 mL/min (ref >60)
Glucose, Bld: 173 mg/dL — ABNORMAL HIGH (ref 70–99)
Potassium: 3.9 mmol/L (ref 3.5–5.1)
Sodium: 143 mmol/L (ref 135–145)
Total Bilirubin: 0.8 mg/dL (ref 0.0–1.2)
Total Protein: 5.9 g/dL — ABNORMAL LOW (ref 6.5–8.1)

## 2024-05-07 LAB — GLUCOSE, CAPILLARY
Glucose-Capillary: 167 mg/dL — ABNORMAL HIGH (ref 70–99)
Glucose-Capillary: 218 mg/dL — ABNORMAL HIGH (ref 70–99)
Glucose-Capillary: 214 mg/dL — ABNORMAL HIGH (ref 70–99)
Glucose-Capillary: 244 mg/dL — ABNORMAL HIGH (ref 70–99)
Glucose-Capillary: 227 mg/dL — ABNORMAL HIGH (ref 70–99)
Glucose-Capillary: 232 mg/dL — ABNORMAL HIGH (ref 70–99)

## 2024-05-07 LAB — MAGNESIUM: Magnesium: 1.9 mg/dL (ref 1.7–2.4)

## 2024-05-07 MED ORDER — LOPERAMIDE HCL 2 MG PO CAPS
4.0000 mg | ORAL_CAPSULE | Freq: Once | ORAL | Status: AC
  Administered 2024-05-07: 4 mg via ORAL
  Filled 2024-05-07: qty 2

## 2024-05-07 MED ORDER — HYDROXYZINE HCL 10 MG PO TABS
10.0000 mg | ORAL_TABLET | Freq: Three times a day (TID) | ORAL | Status: DC | PRN
  Administered 2024-05-09 – 2024-07-03 (×45): 10 mg via ORAL
  Filled 2024-05-07 (×44): qty 1

## 2024-05-07 NOTE — Progress Notes (Signed)
 ID brief note   Asked by Dr Joan Mouton to comment on new findings of sacral osteomyelitis. CT was done for evaluation of rt lower abdominal pain. Pictures of chronic sacral wound from media from 6/11 with pink granulation tissue and no signs of infection.   Afebrile, no leukocytosis and hemodynamically stable  Patient with multiple comorbid conditions with deconditioning and poor mobility.   At this time. I recommend to continue holding off on antibiotics and have plastics opine if he is a flap candidate in which case comprehensive work up for  for sacral osteomyelitis can be considered including bone path/biopsy for culture directed antibiotics.   Unlikely he would be a flap candidate however. Would continue wound care currently and plan for antibiotics if and when signs of infection  D/w Dr Joan Mouton.    Terre Ferri, MD Infectious Disease Physician [facility] [address] [place], [place] [address] Phone: [phone]  Fax: [phone]

## 2024-05-07 NOTE — Progress Notes (Signed)
 Daily Progress Note   Patient Name: Uchenna Rappaport       Date: 05/07/2024 DOB: [birth year]  Age: 83 y.o. MRN#: [id number] Attending Physician: Oral Billings, MD Primary Care Physician: [name], MD Admit Date: 03/04/2024  Reason for Consultation/Follow-up: Establishing goals of care  Subjective: Medical records reviewed including progress notes, labs, imaging, MAR. Patient assessed at the bedside.  He does not feel like his pain was any worse last night after changing his pain medicine. No PRN oxycodone  was given overnight. No family was present during my visit.  Created space and opportunity for patient and family's thoughts and feelings on patient's current illness.  He is hopeful that removing the fluid from his abdomen tomorrow will provide him with some relief.  I assisted with him with ginger ale.  He states that his daughter should be here in about 15 minutes.  I shared that I would call to coordinate a discussion with her as well.  Called patient's daughter for ongoing palliative support.  Provided update on ID recommendations for wound care, holding off on antibiotics.  We discussed the difficulties he has had with wound care documentation throughout his hospitalizations.  She strongly feels that he needs to see the physical therapist Rice Chamorro who saw him for wound care on 5/2.  Discussed the option of as needed Atarax with potential for sedative side effects to manage patient's anxiety until benefit from Zoloft can be determined.  She is agreeable as long as it does not interact with other medications.  I called pharmacy and discussed, okay to proceed.  Questions and concerns addressed. PMT will continue to support holistically.   Length of Stay: 75   Physical Exam Vitals and nursing note reviewed.  Constitutional:       General: He is not in acute distress.    Appearance: He is ill-appearing.   Cardiovascular:     Rate and Rhythm: Normal rate.  Pulmonary:     Effort: Tachypnea present.   Skin:    General: Skin is warm and dry.   Neurological:     Mental Status: He is alert.   Psychiatric:        Mood and Affect: Mood is anxious.        Behavior: Behavior normal. Behavior is cooperative.            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    Palliative care evaluated [name]  earlier this month and have been asked to get re-involved for additional goals of care conversations.   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 Continue Oxycodone  2.5mg  PO Q4H PRN for breakthrough pain  Continue sertraline 25mg  PO daily Ordered Atarax 10 mg 3 times daily as needed for anxiety Patient's daughter is requesting  Simone Dubois, PT return for another session of hydrotherapy.  I placed PT consult to inform them of request Ongoing symptom management and palliative support   Prognosis: Concerning prognosis in light of recurrent hospitalizations, declining functional/nutritional status, and several chronic comorbidities  Discharge Planning: Skilled Nursing Facility for rehab with Palliative care service follow-up   Care plan was discussed with patient, patient's daughter, MD   Angeline Kemps, PA-C  Palliative Medicine Team Team phone # [phone]  Thank you for allowing the Palliative Medicine Team to assist in the care of this patient. Please utilize secure chat with additional questions, if there is no response within 30 minutes please call the above phone number.  Palliative Medicine Team providers are available by phone from 7am to 7pm daily and can be reached through the team cell phone.  Should this patient require assistance outside of these hours, please call the patient's attending physician.     Time Total: 50  Visit consisted of counseling and education dealing with the complex and emotionally intense issues of symptom management and palliative care in the setting of serious and potentially life-threatening illness. Greater than 50% of this time was spent counseling and coordinating care related to the above assessment and plan.  Personally spent 50 minutes in patient care including extensive chart review (labs, imaging, progress/consult notes, vital signs), medically appropraite exam, discussed with treatment team, education to patient, family, and staff, documenting clinical information, medication review and management, coordination of care, and available advanced directive documents.

## 2024-05-07 NOTE — Progress Notes (Addendum)
 Physical Therapy Treatment Patient Details Name: Earl [name] MRN: [id number] DOB: 1941-06-08 Today's Date: 05/07/2024   History of Present Illness 83 y.o. male admitted from [facility] rehab on 03/04/24 with SOB, sacral pain, Afib with RVR. Pt with acute metabolic encephalopathy, rhabdomyolysis and sepsis secondary to sacral wound infection. 4/15 & 4/17 bedside sacral wound debridement. PMhx: adm 3/26-4/08/2024 with fall, AMS, COVID; d/c to SNF rehab. CVA (2021, residual R-side weakness), T2DM, HTN, CHF, MGUS, Afib on Eliquis .    PT Comments  Pt received in supine, anxious and reluctant to participate in therapy session due to c/o pain, although difficult to fully understand pt complaint due to dysarthria, low voice volume and pt apparent anxiety/elevated RR. Pt needing up to +2 maxA for rolling to L/R sides with use of bed side rails and emphasis on pt AROM/AAROM while in supine/sidelying postures and use of IS to maintain improved pulmonary clearance. Ongoing bowel movement at end of session so pt requesting some time to rest prior to completion of posterior hygiene, RN/NT aware. Patient will benefit from continued inpatient follow up therapy, <3 hours/day.    If plan is discharge home, recommend the following: Two people to help with walking and/or transfers;Two people to help with bathing/dressing/bathroom;Assistance with feeding;Assist for transportation   Can travel by private vehicle     No  Equipment Recommendations  Hospital bed;Hoyer lift    Recommendations for Other Services       Precautions / Restrictions Precautions Precautions: Fall;Other (comment) Recall of Precautions/Restrictions: Intact Precaution/Restrictions Comments: significant pain from sacral wound, tends to want check-in/premedication prior to EOB/OOB but varies Restrictions Weight Bearing Restrictions Per Provider Order: No     Mobility  Bed Mobility Overal bed mobility: Needs Assistance Bed  Mobility: Rolling Rolling: Max assist, Used rails, +2 for safety/equipment         General bed mobility comments: Performed rolling to R and L side to perform pericare this session.  Pt required assistance for hand placement and moving hips into sidelying, with heavier assist to reach full sidelying while pad removed/replaced. RN notified pt sacral foam still appears soiled and will need to be changed. Once sidelying to his R, pt with continued loose BM and requesting to rest and finish BM prior to rolling back to his other side, RN/NT notified.    Transfers Overall transfer level: Needs assistance                 General transfer comment: pt refusing due to anxiety/pain/fatigue and apparent loose stools    Ambulation/Gait               General Gait Details: Non-ambulatory recent baseline   Stairs             Wheelchair Mobility     Tilt Bed    Modified Rankin (Stroke Patients Only)       Balance Overall balance assessment: Needs assistance     Sitting balance - Comments: pt refusing due to fatigue/pain and anxiety, then unable due to frequent loose stools                                    Communication Communication Communication: Impaired Factors Affecting Communication: Reduced clarity of speech  Cognition Arousal: Alert Behavior During Therapy: Anxious, Lability   PT - Cognitive impairments: Problem solving, Sequencing, Memory, Safety/Judgement, Initiation, Attention, Awareness  PT - Cognition Comments: Pt very anxious, increased RR, self-limiting due to c/o pain with initial refusal to participate when attempted ~1315pm,  but agreeable to repositioning and bed mobility for hygiene assist with encouragement after premedication by RN. Pt refusing to attempt EOB/standing with initial encouragement before explaining that he needs hygiene assist. Following commands: Impaired Following commands  impaired: Follows one step commands inconsistently    Cueing Cueing Techniques: Verbal cues, Gestural cues, Tactile cues  Exercises General Exercises - Lower Extremity Ankle Circles/Pumps: AROM, Both, 10 reps, Supine Short Arc Quad: AAROM, Both, 5 reps, Supine Heel Slides: AROM, Both, Supine, AAROM, 10 reps Hip ABduction/ADduction: AAROM, Both, 5 reps, Supine, Sidelying, Other (comment) (a few reps supine and a couple sidelying for placement of pillow for comfort while sidelying for hygiene assist) Other Exercises Other Exercises: IS x 5 reps (including rest breaks) pt achieves ~500-800 mL encouraged hourly use    General Comments General comments (skin integrity, edema, etc.): RN notified pt + BM needs a new sacral foam, NT aware he needs to be checked on after done with ongoing BM.      Pertinent Vitals/Pain Pain Assessment Pain Assessment: PAINAD Breathing: noisy labored breathing, long periods of hyperventilation, Cheyne-Stokes respirations Negative Vocalization: repeated troubled calling out, loud moaning/groaning, crying Facial Expression: facial grimacing Body Language: tense, distressed pacing, fidgeting Consolability: distracted or reassured by voice/touch PAINAD Score: 8 Pain Location: generalized, pt having difficulty localizing; appears to be bottom/sacral wound and anxiety surrounding prospect of increased pain Pain Descriptors / Indicators: Grimacing, Discomfort, Guarding, Moaning Pain Intervention(s): Limited activity within patient's tolerance, Monitored during session, Premedicated before session, Repositioned    Home Living                          Prior Function            PT Goals (current goals can now be found in the care plan section) Acute Rehab PT Goals Patient Stated Goal: To improve strength and mobility to reduce risk for falls. PT Goal Formulation: With patient Time For Goal Achievement: 05/10/24 Progress towards PT goals: Not  progressing toward goals - comment    Frequency    Min 1X/week      PT Plan      Co-evaluation              AM-PAC PT 6 Clicks Mobility   Outcome Measure  Help needed turning from your back to your side while in a flat bed without using bedrails?: A Lot Help needed moving from lying on your back to sitting on the side of a flat bed without using bedrails?: Total Help needed moving to and from a bed to a chair (including a wheelchair)?: Total Help needed standing up from a chair using your arms (e.g., wheelchair or bedside chair)?: Total Help needed to walk in hospital room?: Total Help needed climbing 3-5 steps with a railing? : Total 6 Click Score: 7    End of Session Equipment Utilized During Treatment: Other (comment) (bed pad assist) Activity Tolerance: Patient limited by fatigue;Treatment limited secondary to medical complications (Comment);Other (comment) (anxiety about pain but unclear exactly pt symptoms due to soft voice and dysarthria) Patient left: in bed;with call bell/phone within reach;with bed alarm set;Other (comment) (sidelying toward his R, NT aware he needs further hygiene assist once done with BM) Nurse Communication: Mobility status;Need for lift equipment;Patient requests pain meds;Other (comment) (+ BM) PT Visit Diagnosis: Muscle weakness (generalized) (  M62.81);Adult, failure to thrive (R62.7);Difficulty in walking, not elsewhere classified (R26.2);Pain;Other abnormalities of gait and mobility (R26.89) Pain - part of body:  (lower back/bottom)     Time: 4098-1191 PT Time Calculation (min) (ACUTE ONLY): 20 min  Charges:    $Therapeutic Exercise: 8-22 mins PT General Charges $$ ACUTE PT VISIT: 1 Visit                     Laporsha Grealish P., PTA Acute Rehabilitation Services Secure Chat Preferred 9a-5:30pm Office: [phone]    Mariel Shope [facility] 05/07/2024, 4:27 PM

## 2024-05-07 NOTE — Consult Note (Signed)
 Reason for Consult: Sacral wound Referring Physician: Dr. Cherylle Corwin  Earl [name] is an 83 y.o. male.  HPI: Earl [name] is an 83 year old male with a long and complicated medical history who is currently hospitalized with generalized deconditioning after septic shock.  Per his daughter he was admitted to the hospital several months ago for a COVID infection and developed a sacral wound infection at that time.  He has been undergoing wound care and is seeing steady progress.  I was consulted today because of a finding consistent with osteomyelitis on a recent CT scan.  Of note the patient does have congestive heart failure and atrial fibrillation.  He is anticoagulated.  He has decreased albumin  and protein and is anemic.  Blood sugars have been slightly elevated but are reasonably well-controlled  Past Medical History:  Diagnosis Date   Adenomatous colon polyp    Allergy    Anal fissure    Arthritis    BPH (benign prostatic hyperplasia)    Diabetes mellitus (HCC)    GERD (gastroesophageal reflux disease)    Hyperlipidemia    Hypertension    Hypothyroidism    Other testicular hypofunction    Stroke [facility])     Past Surgical History:  Procedure Laterality Date   CIRCUMCISION N/A 06/11/2019   Procedure: CIRCUMCISION ADULT;  Surgeon: Christina Coyer, MD;  Location: [facility] ORS;  Service: Urology;  Laterality: N/A;   COLONOSCOPY     INGUINAL HERNIA REPAIR Bilateral    1968 and 2004   LOOP RECORDER INSERTION N/A 07/28/2017   Procedure: LOOP RECORDER INSERTION;  Surgeon: Verona Goodwill, MD;  Location: [facility] INVASIVE CV LAB;  Service: Cardiovascular;  Laterality: N/A;   RIGHT/LEFT HEART CATH AND CORONARY ANGIOGRAPHY N/A 10/07/2021   Procedure: RIGHT/LEFT HEART CATH AND CORONARY ANGIOGRAPHY;  Surgeon: Darlis Eisenmenger, MD;  Location: [facility] INVASIVE CV LAB;  Service: Cardiovascular;  Laterality: N/A;   TEE WITHOUT CARDIOVERSION N/A 07/28/2017   Procedure: TRANSESOPHAGEAL ECHOCARDIOGRAM (TEE);   Surgeon: Jacqueline Matsu, MD;  Location: [facility] ENDOSCOPY;  Service: Cardiovascular;  Laterality: N/A;    Family History  Problem Relation Age of Onset   Sickle cell anemia Mother    Diabetes Father    Stomach cancer Father    Colon cancer Father    CAD Neg Hx    Esophageal cancer Neg Hx    Rectal cancer Neg Hx     Social History:  reports that he has never smoked. He has never used smokeless tobacco. He reports that he does not drink alcohol  and does not use drugs.  Allergies:  Allergies  Allergen Reactions   Penicillins Other (See Comments)    Did it involve swelling of the face/tongue/throat, SOB, or low BP? Unknown Did it involve sudden or severe rash/hives, skin peeling, or any reaction on the inside of your mouth or nose? Unknown Did you need to seek medical attention at a hospital or doctor's office? No When did it last happen?    Over 98 Years Ago   If all above answers are NO, may proceed with cephalosporin use.        Results for orders placed or performed during the hospital encounter of 03/04/24 (from the past 48 hours)  Glucose, capillary     Status: Abnormal   Collection Time: 05/05/24  8:25 PM  Result Value Ref Range   Glucose-Capillary 252 (H) 70 - 99 mg/dL    Comment: Glucose reference range applies only to samples taken after fasting for at  least 8 hours.  Comprehensive metabolic panel with GFR     Status: Abnormal   Collection Time: 05/06/24  4:16 AM  Result Value Ref Range   Sodium 144 135 - 145 mmol/L   Potassium 3.4 (L) 3.5 - 5.1 mmol/L   Chloride 108 98 - 111 mmol/L   CO2 27 22 - 32 mmol/L   Glucose, Bld 136 (H) 70 - 99 mg/dL    Comment: Glucose reference range applies only to samples taken after fasting for at least 8 hours.   BUN 35 (H) 8 - 23 mg/dL   Creatinine, Ser 1.61 0.61 - 1.24 mg/dL   Calcium  8.7 (L) 8.9 - 10.3 mg/dL   Total Protein 5.7 (L) 6.5 - 8.1 g/dL   Albumin  2.5 (L) 3.5 - 5.0 g/dL   AST 32 15 - 41 U/L   ALT 33 0 - 44 U/L    Alkaline Phosphatase 108 38 - 126 U/L   Total Bilirubin 0.8 0.0 - 1.2 mg/dL   GFR, Estimated >09 >60 mL/min    Comment: (NOTE) Calculated using the CKD-EPI Creatinine Equation (2021)    Anion gap 9 5 - 15    Comment: Performed at [facility], [address] [place], [place] [address]  CBC     Status: Abnormal   Collection Time: 05/06/24  4:16 AM  Result Value Ref Range   WBC 6.8 4.0 - 10.5 K/uL   RBC 3.69 (L) 4.22 - 5.81 MIL/uL   Hemoglobin 10.4 (L) 13.0 - 17.0 g/dL   HCT 81.1 (L) 91.4 - 78.2 %   MCV 89.7 80.0 - 100.0 fL   MCH 28.2 26.0 - 34.0 pg   MCHC 31.4 30.0 - 36.0 g/dL   RDW 95.6 (H) 21.3 - 08.6 %   Platelets 208 150 - 400 K/uL   nRBC 0.0 0.0 - 0.2 %    Comment: Performed at [facility], [address] [place], [place] [address]  Glucose, capillary     Status: Abnormal   Collection Time: 05/06/24  7:50 AM  Result Value Ref Range   Glucose-Capillary 157 (H) 70 - 99 mg/dL    Comment: Glucose reference range applies only to samples taken after fasting for at least 8 hours.  Glucose, capillary     Status: Abnormal   Collection Time: 05/06/24 11:51 AM  Result Value Ref Range   Glucose-Capillary 185 (H) 70 - 99 mg/dL    Comment: Glucose reference range applies only to samples taken after fasting for at least 8 hours.  Glucose, capillary     Status: Abnormal   Collection Time: 05/06/24  5:09 PM  Result Value Ref Range   Glucose-Capillary 137 (H) 70 - 99 mg/dL    Comment: Glucose reference range applies only to samples taken after fasting for at least 8 hours.  Glucose, capillary     Status: Abnormal   Collection Time: 05/06/24  8:41 PM  Result Value Ref Range   Glucose-Capillary 174 (H) 70 - 99 mg/dL    Comment: Glucose reference range applies only to samples taken after fasting for at least 8 hours.  Comprehensive metabolic panel with GFR     Status: Abnormal   Collection Time: 05/07/24  4:20 AM  Result Value Ref Range   Sodium 143 135 - 145 mmol/L    Potassium 3.9 3.5 - 5.1 mmol/L   Chloride 108 98 - 111 mmol/L   CO2 25 22 - 32 mmol/L   Glucose, Bld 173 (  H) 70 - 99 mg/dL    Comment: Glucose reference range applies only to samples taken after fasting for at least 8 hours.   BUN 35 (H) 8 - 23 mg/dL   Creatinine, Ser 6.57 0.61 - 1.24 mg/dL   Calcium  8.6 (L) 8.9 - 10.3 mg/dL   Total Protein 5.9 (L) 6.5 - 8.1 g/dL   Albumin  2.7 (L) 3.5 - 5.0 g/dL   AST 32 15 - 41 U/L   ALT 34 0 - 44 U/L   Alkaline Phosphatase 112 38 - 126 U/L   Total Bilirubin 0.8 0.0 - 1.2 mg/dL   GFR, Estimated >84 >69 mL/min    Comment: (NOTE) Calculated using the CKD-EPI Creatinine Equation (2021)    Anion gap 10 5 - 15    Comment: Performed at [facility], [address], [place], [place] [address]  CBC     Status: Abnormal   Collection Time: 05/07/24  4:20 AM  Result Value Ref Range   WBC 6.7 4.0 - 10.5 K/uL   RBC 3.83 (L) 4.22 - 5.81 MIL/uL   Hemoglobin 10.9 (L) 13.0 - 17.0 g/dL   HCT 84.1 (L) 32.4 - 40.1 %   MCV 89.8 80.0 - 100.0 fL   MCH 28.5 26.0 - 34.0 pg   MCHC 31.7 30.0 - 36.0 g/dL   RDW 02.7 (H) 25.3 - 66.4 %   Platelets 233 150 - 400 K/uL   nRBC 0.0 0.0 - 0.2 %    Comment: Performed at [facility], [address] [place], [place] [address]  Magnesium      Status: None   Collection Time: 05/07/24  4:20 AM  Result Value Ref Range   Magnesium  1.9 1.7 - 2.4 mg/dL    Comment: Performed at [facility], [address], [place], [place] [address]  Glucose, capillary     Status: Abnormal   Collection Time: 05/07/24  5:27 AM  Result Value Ref Range   Glucose-Capillary 167 (H) 70 - 99 mg/dL    Comment: Glucose reference range applies only to samples taken after fasting for at least 8 hours.  Glucose, capillary     Status: Abnormal   Collection Time: 05/07/24  8:18 AM  Result Value Ref Range   Glucose-Capillary 218 (H) 70 - 99 mg/dL    Comment: Glucose reference range applies only to samples taken after fasting for at least 8  hours.  Glucose, capillary     Status: Abnormal   Collection Time: 05/07/24 10:39 AM  Result Value Ref Range   Glucose-Capillary 214 (H) 70 - 99 mg/dL    Comment: Glucose reference range applies only to samples taken after fasting for at least 8 hours.  Glucose, capillary     Status: Abnormal   Collection Time: 05/07/24 12:20 PM  Result Value Ref Range   Glucose-Capillary 244 (H) 70 - 99 mg/dL    Comment: Glucose reference range applies only to samples taken after fasting for at least 8 hours.  Glucose, capillary     Status: Abnormal   Collection Time: 05/07/24  4:44 PM  Result Value Ref Range   Glucose-Capillary 227 (H) 70 - 99 mg/dL    Comment: Glucose reference range applies only to samples taken after fasting for at least 8 hours.   *Note: Due to a large number of results and/or encounters for the requested time period, some results have not been displayed. A complete set of results can be found in Results Review.  CT ABDOMEN PELVIS W CONTRAST Result Date: 05/06/2024 CLINICAL DATA:  Right lower quadrant abdominal pain. EXAM: CT ABDOMEN AND PELVIS WITH CONTRAST TECHNIQUE: Multidetector CT imaging of the abdomen and pelvis was performed using the standard protocol following bolus administration of intravenous contrast. RADIATION DOSE REDUCTION: This exam was performed according to the departmental dose-optimization program which includes automated exposure control, adjustment of the mA and/or kV according to patient size and/or use of iterative reconstruction technique. CONTRAST:  75mL OMNIPAQUE  IOHEXOL  350 MG/ML SOLN COMPARISON:  CT abdomen/pelvis dated 03/04/2024. FINDINGS: Lower chest: New moderate right pleural effusion and increased now moderate left pleural effusion with associated bibasilar atelectasis. Stable cardiomegaly. Hepatobiliary: No focal liver abnormality is seen. No gallstones, gallbladder wall thickening, or biliary dilatation. Pancreas: Unremarkable. No pancreatic ductal  dilatation or surrounding inflammatory changes. Spleen: Normal in size without focal abnormality. Adrenals/Urinary Tract: Adrenal glands are unremarkable. Kidneys enhance symmetrically. Similar small cyst, for which no follow-up imaging is recommended. No obstructive urolithiasis or hydronephrosis. Bladder is unremarkable. Stomach/Bowel: Stomach and small bowel are within normal limits. No evidence of obstruction or focal inflammatory changes. Scattered stool throughout the colon. Vascular/Lymphatic: Abdominal aorta is normal in caliber with atherosclerotic calcification. No pathologically enlarged abdominal or pelvic lymph nodes. Reproductive: Prostate is unremarkable. Other: Trace abdominopelvic ascites. Diffuse body wall anasarca, increased since the prior exam. No intraperitoneal free air. Musculoskeletal: Redemonstrated sacral decubitus wound areas of cortical irregularity along the dorsal aspect of the underlying mid sacrum (series 4, image 60), concerning for osteomyelitis. No discrete loculated fluid collection. Lumbosacral transitional anatomy with sacralization of L5. Similar remote superior endplate compression deformity of the L1 vertebral body. IMPRESSION: 1. New trace abdominopelvic ascites and increased diffuse body wall anasarca is nonspecific and may be secondary to third spacing of fluids. 2. Redemonstrated sacral decubitus wound/ulcer with areas of cortical irregularity along the underlying dorsal mid sacrum, concerning for osteomyelitis. These findings appear new since the prior exam. No discrete loculated fluid collection. 3. Similar remote superior endplate compression deformity of the L1 vertebral body. 4. New moderate right pleural effusion and increased now moderate left pleural effusion with associated bibasilar atelectasis. 5. Cardiomegaly. 6.  Aortic Atherosclerosis (ICD10-I70.0). Electronically Signed   By: Mannie Seek M.D.   On: 05/06/2024 13:35    ROS Blood pressure 135/82,  pulse 82, temperature 97.6 F (36.4 C), resp. rate 18, height 5' 11 (1.803 m), weight 80.1 kg, SpO2 97%. Physical Exam Patient has a small grade 4 sacral decubitus.  The wound is relatively clean.  I do not probe for tunneling or tracking today.  Sacrum is exposed at the base of the wound.  Assessment/Plan: Sacral decubitus: Patient has a relatively clean sacral decubitus.  In my opinion he is not a candidate for any type of flap closure.  Neither he nor his daughter are interested in surgery at this time.  Closure of the wound would only be appropriate if the patient is able to completely unload the area himself or has the social support to do so.  He would need to be able to tolerate surgery in the prone position for 2 to 4 hours.  He would also need to have optimized nutrition to include successful treatment of his anemia.  At this time the wound is clean.  I would strongly recommend continued wound care.  I am available as needed.  Patient may follow-up in my office once he is discharged.  Teretha Ferguson 05/07/2024, 8:11 PM

## 2024-05-07 NOTE — Progress Notes (Signed)
 Progress Note   Patient: Earl [name] ZOX:[id number] DOB: Dec 15, 1940 DOA: 03/04/2024     64 DOS: the patient was seen and examined on 05/07/2024   Brief hospital course: 83 yrs old male with past medical history of paroxysmal atrial fibrillation on Eliquis , insulin -dependent diabetes mellitus type 2, essential hypertension, CVA with right residual weakness, MGUS, BPH, HFr EF of 40%, He was discharged, recently from [facility] after admitted for acute metabolic encephalopathy, rhabdomyolysis and orthostatic, chronic pancytopenia presents to the ED from skilled nursing facility for shortness of breath, sacral pain, In the ED, he was found to be in RVR, tachypneic with saturations greater than 90%. Critical care was consulted for persistent hypotension. Respiratory panel was negative. General surgery was consulted for sacral wound debridement with bedside excision on 03/05/2024 and 03/07/2024. He is now hemodynamically stable for discharge.    6/4: Some concern of increasing discharge and some order from wound so wound care was reconsulted-repeat wound cultures were sent.  Daughter concerned of some shortness of breath which was not observed during rounds, chest x-ray with mild bibasilar subsegmental atelectasis or edema with small pleural effusions.  Remained on room air.   6/5:Hemodynamically stable.  Repeat cultures from wound yesterday with no organism.   6/6 - he is lying in bed, weak, does not wish to go to any facility.    6/7: Wound cultures 6/4 back with pseudomonas, klebsiella.    6/8: daughter at bedside, wishes he can be taken out of room for his birthday.    6/9- Seems more short of breath, feels dizzy, lightheaded. Daughter at bedside concerned about his abdominal distension, discomfort. Also has bilateral thigh swelling. Cardiology advised palliative, lasix  as needed.   6/10 - he felt better, blood sugars low overnight, insulin  stopped, started d5.    6/11 till 6/13 - Sugars  improved, eating fair. Restarted lantus , accucheks. On and off shortness of breath. Daughter asked about his wounds. SLP, WOC, Palliative involved.   Assessment and Plan: Severe sepsis - resolved On presentation, lactic acidosis, leukocytosis, fever, secondary to infected chronic wound.  Patient did receive multiple antibiotics during long hospital stay.  Sepsis now resolved.   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  Completed antibiotics 5/1.  General surgery now signed off.  Wound care following.  Infectious disease signed off.  Continue wound care management/regimen.  Offload pressure with every 2 hours turns, adequate nutrition.  Wound care as below -   1 - Left Hip PI unstageable Clean with Vashe ( lawson # [id number]), allow to air dry, apply santyl  to wound bed, cover with saline soaked gauze, and then cover with foam dressing, change daily  2 - Left shoulder unstageable Clean with Vashe ( lawson # [id number]), allow to air dry, apply santyl  to wound bed, cover with saline soaked gauze, and then cover with foam dressing, change daily 3 - Sacral Unstageable PI Cleanse with Vashe#[id number]. Apply 1/3 sheet of Aquacel Ag+ (silver hydrofiber) Lawson # [id number] in the wound bed and fill the tunneling using a swab, cover with sacral foam dressing, change daily and PRN soilage 4.Right heel DTPI Clean with Vashe ( lawson # [id number]), allow to air dry, apply santyl  to wound bed, cover with saline soaked gauze, and then cover with foam dressing, change daily Both feet should be placed in Prevalon boots to offload pressure Timm Foot [phone])  Repeat cultures were sent on 6/4 grew klebsiella, pseudomonas. Sensitivities reviewed. Pt completed 7 days of ceftazidime   that was started on 6/7 -6/16 CT abd incidentally has findings suggestive of sacral osteomyelitis. -discussed the case with both ID and Plastic Surgeon on call. Per Plastic Surgeon, this patient is not a  candidate for flap. Therefore, ID recommendations are for wound care and antibiotics if and when signs of infection.   Acute on chronic pain - secondary to above Continue OxyContin  10mg  at bedtime, with oxycodone  2.5mg  q4hr prn.  Had been planning to wean off opioids entirely by discharge. On and off severe pain requiring PRN meds. -Recent CT abd/pelvis. Findings of gross vol overload and third spacing as well as mod B pleural effusions -Continuing 40mg  IV BID   Paroxysmal atrial fibrillation Continue amiodarone  and Eliquis .    HFrEF/cardiac amyloidosis/NICM On tafamadis for amyloid.  GDMT not in place secondary to hypotension.  Continue oral Lasix  20 mg daily.  Continue IV Lasix  as needed to relieve symptoms (sob, abdominal wall, thigh edema). Cardiology did follow him 6/9 discussed with daughter- he has end stage CHF and prognosis is poor. He could not tolerate GDMT. Daughter aware of his poor condition, prognosis. Palliative on board for pain control and goals of care discussion. Remains grossly overloaded including scrotal swelling. CT abd/pelvis on 6/16 also with gross vol overload -Tolerating IV lasix  40mg  BID. Already down 6.5kg since 6/15 with diuresis -Renal function stable -B moderate enlarging pleural effusions on CT. Discussed with family who agrees with thoracentesis. Will place order thoracentesis   Insulin -dependent diabetes mellitus Continue semglee  8 units, accucheks, sliding scale insulin . Monitor for hypoglycemia   Anemia of chronic disease S/p 1 unit PRBC 4/30.  Hemoglobin stable, no active bleeding.   Dysphagia/severe protein calorie malnutrition Continue 3 times daily protein calorie supplementation.  Vitamin supplementation.  Continues on pured diet.     Physical debilitation of muscle weakness PT/ OT working with him.  However, limited ability to sit upright in chair secondary to pain from sacral ulcer.   Continue frequent repositioning.   Goals of  care Patient is medically appropriate for discharge at this time. He is DNR-limited intervention. His overall prognosis is poor. Patient would like to continue current treatment, refuses to go to any facility and wishes to go home. Daughter wishes transition to home care once his wounds are better.  However given his debility, poor prognosis, SNF for full time care would be appropriate.  TOC working with admin regarding safe disposition. Palliative follow up on goals of care, pain control.   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 abd/pelvis -Pt recently completed 7 day course of ceftazidime   -Per above. Discussed case with both ID and Plastic Surgeon. Not candidate for flap per Plastic Surgeon. ID has therefore recommended focus on wound care and antibiotic if and when signs of infection, otherwise hold off on antibiotics      Subjective: Voiding very well. LE swelling improving  Physical Exam: Vitals:   05/07/24 0824 05/07/24 1108 05/07/24 1158 05/07/24 1642  BP: 117/77 125/73 122/67 135/82  Pulse: 70 81 66 82  Resp: 16 20 (!) 25 18  Temp: (!) 97.5 F (36.4 C)  (!) 97.4 F (36.3 C) 97.6 F (36.4 C)  TempSrc:      SpO2: 98% 100% 100% 97%  Weight:      Height:       General exam: Conversant,appears somewhat uncomfortable in bed Respiratory system: normal chest rise, clear, no audible wheezing Cardiovascular system: regular rhythm, s1-s2 Gastrointestinal system: Nondistended, nontender, pos BS Central nervous system: No seizures, no  tremors Extremities: No cyanosis, no joint deformities, LE edema improving Skin: No rashes, no pallor Psychiatry: Affect normal // affect seems normal   Data Reviewed:  Labs reviewed: Na 143, K 3.9, Cr 1.00, WBC 6.7, Hgb 10.9, Plts 233   Family Communication: Pt in room, family currently not at bedside  Disposition: Status is: Inpatient Remains inpatient appropriate because: severity of illness  Planned Discharge Destination:  Home    Author: Cherylle Corwin, MD 05/07/2024 6:57 PM  For on call review [email or web address].

## 2024-05-07 NOTE — Progress Notes (Addendum)
 Physical Therapy Treatment Patient Details Name: Earl [name] MRN: [id number] DOB: 1940-12-15 Today's Date: 05/07/2024   History of Present Illness 83 y.o. male admitted from [facility] SNF rehab on 03/04/24 with SOB, sacral pain, Afib with RVR. Pt with acute metabolic encephalopathy, rhabdomyolysis and sepsis secondary to sacral wound infection. 4/15 & 4/17 bedside sacral wound debridement. PMhx: adm 3/26-4/08/2024 with fall, AMS, COVID; d/c to SNF rehab. CVA (2021, residual R-side weakness), T2DM, HTN, CHF, MGUS, Afib on Eliquis .    PT Comments  Pt received in supine, NT requesting PTA return to assist as +2 to complete peri-care, pt agreeable to additional session with emphasis on bed mobility and body mechanics to improve pt independence with bed mobility. Pt remains anxious and c/o fatigue so still needing maxA +1-2 for rolling completely to his side during rolling/hygiene assist and bed pad repositioning. Pt needing +2 totalA to scoot toward HOB after rolling via bed pad assist, heels floated. Patient will benefit from continued inpatient follow up therapy, <3 hours/day.     If plan is discharge home, recommend the following: Two people to help with walking and/or transfers;Two people to help with bathing/dressing/bathroom;Assistance with feeding;Assist for transportation   Can travel by private vehicle     No  Equipment Recommendations  Hospital bed;Hoyer lift    Recommendations for Other Services       Precautions / Restrictions Precautions Precautions: Fall;Other (comment) Recall of Precautions/Restrictions: Intact Precaution/Restrictions Comments: significant pain from sacral wound, tends to want check-in/premedication prior to EOB/OOB but varies Restrictions Weight Bearing Restrictions Per Provider Order: No     Mobility  Bed Mobility Overal bed mobility: Needs Assistance Bed Mobility: Rolling Rolling: Max assist, Used rails, +2 for safety/equipment          General bed mobility comments: Performed rolling to L side after done with peri-care on his R side. Heavy +2 maxA to fully reach sidelying and totalA +2 for posterior supine scooting toward HOB with arms crossed at chest, pt unable to reach overhead rails due to decreased bil shoulder flexion/pain.    Transfers Overall transfer level: Needs assistance                 General transfer comment: pt refusing due to anxiety/pain/fatigue    Ambulation/Gait               General Gait Details: Non-ambulatory recent baseline   Stairs             Wheelchair Mobility     Tilt Bed    Modified Rankin (Stroke Patients Only)       Balance Overall balance assessment: Needs assistance     Sitting balance - Comments: pt refusing due to fatigue/pain and anxiety, then unable due to frequent loose stools                                    Communication Communication Communication: Impaired Factors Affecting Communication: Reduced clarity of speech  Cognition Arousal: Alert Behavior During Therapy: Anxious, Lability   PT - Cognitive impairments: Problem solving, Sequencing, Memory, Safety/Judgement, Initiation, Attention, Awareness                       PT - Cognition Comments: Pt remains anxious but slightly improved affect after hygiene assist. Following commands: Impaired Following commands impaired: Follows one step commands inconsistently    Cueing Cueing Techniques: Verbal cues, Gestural  cues, Tactile cues  Exercises General Exercises - Lower Extremity Ankle Circles/Pumps: AROM, Both, 10 reps, Supine Short Arc Quad: AAROM, Both, 5 reps, Supine Heel Slides: AROM, Both, Supine, AAROM, 10 reps Hip ABduction/ADduction: AAROM, Both, 5 reps, Supine, Sidelying, Other (comment) (a few reps supine and a couple sidelying for placement of pillow for comfort while sidelying for hygiene assist) Other Exercises Other Exercises: IS x 5 reps  (including rest breaks) pt achieves ~500-800 mL encouraged hourly use    General Comments General comments (skin integrity, edema, etc.): RN notified pt + BM needs a new sacral foam, NT aware he needs to be checked on after done with ongoing BM. SpO2 and HR WFL per finger pulse oximeter.      Pertinent Vitals/Pain Pain Assessment Pain Assessment: PAINAD Breathing: noisy labored breathing, long periods of hyperventilation, Cheyne-Stokes respirations Negative Vocalization: repeated troubled calling out, loud moaning/groaning, crying Facial Expression: facial grimacing Body Language: tense, distressed pacing, fidgeting Consolability: distracted or reassured by voice/touch PAINAD Score: 8 Pain Location: generalized, pt having difficulty localizing; appears to be bottom/sacral wound and anxiety surrounding prospect of increased pain Pain Descriptors / Indicators: Grimacing, Discomfort, Guarding, Moaning Pain Intervention(s): Limited activity within patient's tolerance, Monitored during session, Premedicated before session, Repositioned    Home Living                          Prior Function            PT Goals (current goals can now be found in the care plan section) Acute Rehab PT Goals Patient Stated Goal: To improve strength and mobility to reduce risk for falls. PT Goal Formulation: With patient Time For Goal Achievement: 05/10/24 Progress towards PT goals: Progressing toward goals    Frequency    Min 1X/week      PT Plan      Co-evaluation              AM-PAC PT 6 Clicks Mobility   Outcome Measure  Help needed turning from your back to your side while in a flat bed without using bedrails?: A Lot Help needed moving from lying on your back to sitting on the side of a flat bed without using bedrails?: Total Help needed moving to and from a bed to a chair (including a wheelchair)?: Total Help needed standing up from a chair using your arms (e.g.,  wheelchair or bedside chair)?: Total Help needed to walk in hospital room?: Total Help needed climbing 3-5 steps with a railing? : Total 6 Click Score: 7    End of Session Equipment Utilized During Treatment: Other (comment) (bed pad assist) Activity Tolerance: Patient limited by fatigue;Treatment limited secondary to medical complications (Comment);Other (comment) (anxiety about pain but unclear exactly pt symptoms due to soft voice and dysarthria) Patient left: in bed;with call bell/phone within reach;with bed alarm set;Other (comment) (sidelying toward his R, pillows behind L hip/lower back to offload and heels floated; pt refusing to don Prevalon boots) Nurse Communication: Mobility status;Need for lift equipment;Patient requests pain meds;Other (comment) (+ BM) PT Visit Diagnosis: Muscle weakness (generalized) (M62.81);Adult, failure to thrive (R62.7);Difficulty in walking, not elsewhere classified (R26.2);Pain;Other abnormalities of gait and mobility (R26.89) Pain - part of body:  (lower back/bottom)     Time: 1610-9604 PT Time Calculation (min) (ACUTE ONLY): 8 min  Charges:    $Therapeutic Activity: 8-22 mins PT General Charges $$ ACUTE PT VISIT: 1 Visit  Llana Rile., PTA Acute Rehabilitation Services Secure Chat Preferred 9a-5:30pm Office: [phone]    Mariel Shope [facility] 05/07/2024, 4:37 PM

## 2024-05-08 ENCOUNTER — Inpatient Hospital Stay (HOSPITAL_COMMUNITY)

## 2024-05-08 ENCOUNTER — Ambulatory Visit: Admitting: Podiatry

## 2024-05-08 DIAGNOSIS — R6520 Severe sepsis without septic shock: Secondary | ICD-10-CM | POA: Diagnosis not present

## 2024-05-08 DIAGNOSIS — A419 Sepsis, unspecified organism: Secondary | ICD-10-CM | POA: Diagnosis not present

## 2024-05-08 HISTORY — PX: IR THORACENTESIS ASP PLEURAL SPACE W/IMG GUIDE: IMG5380

## 2024-05-08 LAB — CBC
HCT: 35.8 % — ABNORMAL LOW (ref 39.0–52.0)
Hemoglobin: 11.2 g/dL — ABNORMAL LOW (ref 13.0–17.0)
MCH: 28.5 pg (ref 26.0–34.0)
MCHC: 31.3 g/dL (ref 30.0–36.0)
MCV: 91.1 fL (ref 80.0–100.0)
Platelets: 224 10*3/uL (ref 150–400)
RBC: 3.93 MIL/uL — ABNORMAL LOW (ref 4.22–5.81)
RDW: 18.2 % — ABNORMAL HIGH (ref 11.5–15.5)
WBC: 6.3 10*3/uL (ref 4.0–10.5)
nRBC: 0 % (ref 0.0–0.2)

## 2024-05-08 LAB — BODY FLUID CELL COUNT WITH DIFFERENTIAL
Eos, Fluid: 1 %
Lymphs, Fluid: 54 %
Monocyte-Macrophage-Serous Fluid: 13 % — ABNORMAL LOW (ref 50–90)
Neutrophil Count, Fluid: 32 % — ABNORMAL HIGH (ref 0–25)
Total Nucleated Cell Count, Fluid: 337 uL (ref 0–1000)

## 2024-05-08 LAB — COMPREHENSIVE METABOLIC PANEL WITH GFR
ALT: 37 U/L (ref 0–44)
AST: 34 U/L (ref 15–41)
Albumin: 2.6 g/dL — ABNORMAL LOW (ref 3.5–5.0)
Alkaline Phosphatase: 113 U/L (ref 38–126)
Anion gap: 9 (ref 5–15)
BUN: 32 mg/dL — ABNORMAL HIGH (ref 8–23)
CO2: 26 mmol/L (ref 22–32)
Calcium: 8.4 mg/dL — ABNORMAL LOW (ref 8.9–10.3)
Chloride: 107 mmol/L (ref 98–111)
Creatinine, Ser: 1 mg/dL (ref 0.61–1.24)
GFR, Estimated: >60 mL/min (ref >60)
Glucose, Bld: 179 mg/dL — ABNORMAL HIGH (ref 70–99)
Potassium: 3.4 mmol/L — ABNORMAL LOW (ref 3.5–5.1)
Sodium: 142 mmol/L (ref 135–145)
Total Bilirubin: 0.9 mg/dL (ref 0.0–1.2)
Total Protein: 6 g/dL — ABNORMAL LOW (ref 6.5–8.1)

## 2024-05-08 LAB — GLUCOSE, CAPILLARY
Glucose-Capillary: 160 mg/dL — ABNORMAL HIGH (ref 70–99)
Glucose-Capillary: 273 mg/dL — ABNORMAL HIGH (ref 70–99)
Glucose-Capillary: 121 mg/dL — ABNORMAL HIGH (ref 70–99)

## 2024-05-08 MED ORDER — ALBUMIN HUMAN 25 % IV SOLN
25.0000 g | Freq: Once | INTRAVENOUS | Status: AC
  Administered 2024-05-08: 25 g via INTRAVENOUS
  Filled 2024-05-08: qty 100

## 2024-05-08 MED ORDER — LIDOCAINE HCL 1 % IJ SOLN
INTRAMUSCULAR | Status: AC
Start: 2024-05-08 — End: 2024-05-08
  Filled 2024-05-08: qty 20

## 2024-05-08 MED ORDER — LIDOCAINE HCL 1 % IJ SOLN
20.0000 mL | Freq: Once | INTRAMUSCULAR | Status: AC
  Administered 2024-05-08: 10 mL
  Filled 2024-05-08: qty 20

## 2024-05-08 MED ORDER — POTASSIUM CHLORIDE 20 MEQ PO PACK
40.0000 meq | PACK | Freq: Once | ORAL | Status: AC
  Administered 2024-05-08: 40 meq via ORAL
  Filled 2024-05-08: qty 2

## 2024-05-08 NOTE — Plan of Care (Signed)
  Problem: Fluid Volume: Goal: Hemodynamic stability will improve Outcome: Progressing   Problem: Clinical Measurements: Goal: Diagnostic test results will improve Outcome: Progressing Goal: Signs and symptoms of infection will decrease Outcome: Progressing   Problem: Respiratory: Goal: Ability to maintain adequate ventilation will improve Outcome: Progressing   Problem: Education: Goal: Knowledge of risk factors and measures for prevention of condition will improve Outcome: Progressing   Problem: Coping: Goal: Psychosocial and spiritual needs will be supported Outcome: Progressing   Problem: Respiratory: Goal: Will maintain a patent airway Outcome: Progressing Goal: Complications related to the disease process, condition or treatment will be avoided or minimized Outcome: Progressing   Problem: Education: Goal: Ability to describe self-care measures that may prevent or decrease complications (Diabetes Survival Skills Education) will improve Outcome: Progressing   Problem: Coping: Goal: Ability to adjust to condition or change in health will improve Outcome: Progressing   Problem: Fluid Volume: Goal: Ability to maintain a balanced intake and output will improve Outcome: Progressing   Problem: Health Behavior/Discharge Planning: Goal: Ability to manage health-related needs will improve Outcome: Progressing   Problem: Metabolic: Goal: Ability to maintain appropriate glucose levels will improve Outcome: Progressing   Problem: Nutritional: Goal: Maintenance of adequate nutrition will improve Outcome: Progressing Goal: Progress toward achieving an optimal weight will improve Outcome: Progressing   Problem: Skin Integrity: Goal: Risk for impaired skin integrity will decrease Outcome: Progressing   Problem: Tissue Perfusion: Goal: Adequacy of tissue perfusion will improve Outcome: Progressing   Problem: Education: Goal: Knowledge of General Education information  will improve Description: Including pain rating scale, medication(s)/side effects and non-pharmacologic comfort measures Outcome: Progressing   Problem: Health Behavior/Discharge Planning: Goal: Ability to manage health-related needs will improve Outcome: Progressing   Problem: Clinical Measurements: Goal: Ability to maintain clinical measurements within normal limits will improve Outcome: Progressing Goal: Will remain free from infection Outcome: Progressing Goal: Diagnostic test results will improve Outcome: Progressing Goal: Respiratory complications will improve Outcome: Progressing Goal: Cardiovascular complication will be avoided Outcome: Progressing   Problem: Activity: Goal: Risk for activity intolerance will decrease Outcome: Progressing   Problem: Nutrition: Goal: Adequate nutrition will be maintained Outcome: Progressing   Problem: Coping: Goal: Level of anxiety will decrease Outcome: Progressing   Problem: Elimination: Goal: Will not experience complications related to bowel motility Outcome: Progressing Goal: Will not experience complications related to urinary retention Outcome: Progressing   Problem: Pain Managment: Goal: General experience of comfort will improve and/or be controlled Outcome: Progressing   Problem: Safety: Goal: Ability to remain free from injury will improve Outcome: Progressing   Problem: Skin Integrity: Goal: Risk for impaired skin integrity will decrease Outcome: Progressing

## 2024-05-08 NOTE — Progress Notes (Signed)
 PROGRESS NOTE  Earl [name] JYN:[id number] DOB: 1941-09-16 DOA: 03/04/2024 PCP: [name], MD   LOS: 65 days   Brief Narrative / Interim history: 83 year old male with history of PAF on Eliquis , IDDM 2, HTN, CVA with right-sided residual weakness, MGUS, BPH, systolic CHF who is here with sacral pain.  Of note, he was hospitalized 3/26-4/10 with generalized weakness/acute encephalopathy, hypotension, and was COVID PCR positive.  He was eventually discharged to SNF and readmitted 4 days later with pain in the sacral area and shortness of breath.  He was found to be in A-fib with RVR, tachypneic and was hypotensive on admission for which critical care was consulted.  General surgery was consulted for sacral wound debridement status post bedside excision 4/15 and 03/07/2024.  Subjective / 24h Interval events: He opens his eyes and is awake this morning.  Denies any chest pain or significant shortness of breath remained this morning  Assesement and Plan: Principal problem Severe sepsis-on admission, patient had lactic acidosis, white count, fever secondary to infected chronic wound.  He has received multiple antibiotics during his long hospitalization.  Sepsis physiology has now resolved and he is currently afebrile, normotensive, not tachycardic and white count is normal at 6.3  Active problems Stage IV sacral decubitus wound/ulcer-repeat CT scan on 6/16 with some cortical irregularity along the underlying dorsal mid sacrum, concerning for osteomyelitis, findings appearing to be new.  Infectious disease MD reconsulted and Dr. Gillian Lacrosse reviewed the case, did not recommend antibiotics but wound care alone, with plans for antibiotics if and when signs of infection - Dr. Carolynne Citron with plastic surgery evaluated patient 6/17, appears that he has a clean sacral decubitus, not a candidate for any type of flap closure.  Per his note, patient nor daughter were interested in surgery at this time.   Plastic surgery also recommends wound care - He most recently finished [facility]  on 6/13, most recent wound culture done on 6/4 showed Pseudomonas and Klebsiella  Acute on chronic pain-secondary to above, continue pain regimen with oxycodone  as below  Acute on chronic combined CHF, cardiac amyloidosis, nonischemic cardiomyopathy -on tafamidis  and vutrisiran  for amyloid.  Goal-directed medical therapy somewhat limited by prior episodes of hypotension.  Currently there is evidence that he is having anasarca/fluid overload, has been placed on furosemide , continue.  Redose IV albumin   PAF-continue amiodarone  and Eliquis   Anemia of chronic disease-received a unit of packed red blood cells on 4/30.  Hemoglobin is stable  Dysphagia/severe protein calorie malnutrition-continue supplementation, pured diet  Physical debilitation, muscle weakness-PT/OT ongoing  Goals of care -palliative consulted as well, appears to be DNR.  Daughter wants him to transition at home once his wounds are better  IDDM -continue Semglee , sliding scale, Accu-Cheks  CBG (last 3)  Recent Labs    05/07/24 1644 05/07/24 2118 05/08/24 0923  GLUCAP 227* 232* 160*   Lab Results  Component Value Date   HGBA1C 8.4 (H) 02/22/2024   Scheduled Meds:  acetaminophen   500 mg Oral TID   amiodarone   200 mg Oral Daily   apixaban   5 mg Oral BID   atorvastatin   40 mg Oral Daily   Chlorhexidine  Gluconate Cloth  6 each Topical Daily   collagenase    Topical Daily   diclofenac  Sodium  4 g Topical QID   feeding supplement  237 mL Oral BID BM   ferrous sulfate   325 mg Oral QHS   furosemide   40 mg Intravenous BID   guaiFENesin   600 mg Oral BID   insulin   aspart  0-5 Units Subcutaneous QHS   insulin  aspart  0-9 Units Subcutaneous TID WC   insulin  glargine-yfgn  8 Units Subcutaneous Daily   levothyroxine   50 mcg Oral Q0600   liver oil-zinc  oxide   Topical BID   multivitamin with minerals  1 tablet Oral Daily   nutrition supplement  (JUVEN)  1 packet Oral BID BM   mouth rinse  15 mL Mouth Rinse 4 times per day   oxyCODONE   5 mg Oral QHS   phosphorus  500 mg Oral TID   polyethylene glycol  17 g Oral BID   senna-docusate  1 tablet Oral QHS   sertraline  25 mg Oral Daily   sodium chloride  flush  10-40 mL Intracatheter Q12H   sodium chloride  flush  3 mL Intravenous Q12H   Tafamidis   1 capsule Oral Daily   vutrisiran  sodium  25 mg Subcutaneous Q90 days   Continuous Infusions:  dextrose  Stopped (05/01/24 0003)   PRN Meds:.acetaminophen  **OR** acetaminophen , food thickener, hydrOXYzine, lip balm, ondansetron  **OR** ondansetron  (ZOFRAN ) IV, mouth rinse, oxyCODONE , polyethylene glycol, artificial tears, sodium chloride  flush, sodium chloride  flush  Current Outpatient Medications  Medication Instructions   Amino Acids -Protein Hydrolys (PRO-STAT AWC) LIQD 30 mLs, Oral, 2 times daily   AMVUTTRA  25 MG/0.5ML syringe INJECT 25MG  (0.5ML) SUBCUTANEOUSLY ONCE EVERY 3 MONTHS. ROTATE ADMINISTRATION SITE WITH EACH INJECTION. *REFRIGERATE. DO NOT SHAKE.*   ascorbic acid  (VITAMIN C ) 500 mg, Oral, Daily,     atorvastatin  (LIPITOR) 40 MG tablet Take  1 tablet  Daily for Cholesterol                                                       /                                  TAKE                                                       BY                                           MOUTH   B-12 50 mcg, Oral, Daily   Blood Glucose Monitoring Suppl (ONE TOUCH ULTRA 2) w/Device KIT Check blood sugar 1 time a day-DX-E11.9   Cholecalciferol  (VITAMIN D  PO) 1 tablet, Oral, Daily   ELIQUIS  5 MG TABS tablet TAKE 1 TABLET BY MOUTH TWICE  DAILY TO PREVENT BLOOD CLOTS   Emollient (AQUAPHOR ADV PROTECT HEALING) OINT Apply small amount to two areas on buttocks daily to prevent breakdown   ferrous sulfate  325 mg, Oral, Daily at bedtime   finasteride  (PROSCAR ) 5 MG tablet TAKE 1 TABLET BY MOUTH DAILY FOR PROSTATE   glucose blood (ONETOUCH ULTRA) test strip Use as  instructed   Lancets (ONETOUCH DELICA PLUS LANCET30G) MISC CHECK BLOOD SUGAR ONCE A DAY.   Lancets Misc. (ONE TOUCH SURESOFT) MISC Check blood sugar 1 time a day-DX-E11.91   levothyroxine  (SYNTHROID ) 50 MCG  tablet TAKE 1 TABLET BY MOUTH DAILY ON  AN EMPTY STOMACH WITH ONLY WATER FOR 30 MINUTES AND NO ANTACID,  MEDS, CALCIUM  OR MAGNESIUM  FOR 4 HOURS AND AVOID BIOTIN   Magnesium  250 mg, Oral, Daily   metFORMIN  (GLUCOPHAGE -XR) 500 MG 24 hr tablet TAKE 2 TABLETS BY MOUTH TWICE  DAILY WITH MEALS FOR DIABETES   metoprolol  succinate (TOPROL -XL) 25 mg, Oral, Daily at bedtime   Multiple Vitamin (MULTIVITAMIN) capsule 1 capsule, Oral, Daily,     NONFORMULARY OR COMPOUNDED ITEM Please dispense a Rolling Walker.  Dx:R26.81 & G63   Nutritional Supplements (BOOST GLUCOSE CONTROL) LIQD 237 mLs, Oral, 2 times daily after meals   potassium chloride  SA (KLOR-CON  M20) 20 MEQ tablet 20 mEq, Oral, Daily   sacubitril -valsartan  (ENTRESTO ) 24-26 MG 1 tablet, Oral, 2 times daily   Tafamidis  (VYNDAMAX ) 61 MG CAPS Take 1 capsule by mouth daily.    Diet Orders (From admission, onward)     Start     Ordered   04/30/24 1436  DIET - DYS 1 Room service appropriate? No; Fluid consistency: Thin  Diet effective now       Question Answer Comment  Room service appropriate? No   Fluid consistency: Thin      04/30/24 1436            DVT prophylaxis: SCDs Start: 03/04/24 2151 Place TED hose Start: 03/04/24 2151 apixaban  (ELIQUIS ) tablet 5 mg   Lab Results  Component Value Date   PLT 224 05/08/2024      Code Status: Limited: Do not attempt resuscitation (DNR) -DNR-LIMITED -Do Not Intubate/DNI   Family Communication: No family at bedside  Status is: Inpatient Remains inpatient appropriate because: severity of illness  Level of care: Telemetry Medical  Consultants:  Palliative ID Surgery   Objective: Vitals:   05/07/24 1158 05/07/24 1642 05/07/24 2116 05/08/24 0415  BP: 122/67 135/82 124/62 124/71   Pulse: 66 82 79 75  Resp: (!) 25 18 17 17   Temp: (!) 97.4 F (36.3 C) 97.6 F (36.4 C) (!) 97.4 F (36.3 C) 97.6 F (36.4 C)  TempSrc:      SpO2: 100% 97% 100% 98%  Weight:      Height:        Intake/Output Summary (Last 24 hours) at 05/08/2024 0915 Last data filed at 05/07/2024 2238 Gross per 24 hour  Intake 712 ml  Output 3950 ml  Net -3238 ml   Wt Readings from Last 3 Encounters:  05/07/24 80.1 kg  02/14/24 72.1 kg  01/24/24 72.1 kg    Examination:  Constitutional: NAD Eyes: no scleral icterus ENMT: Mucous membranes are moist.  Neck: normal, supple Respiratory: clear to auscultation bilaterally, no wheezing, no crackles. Normal respiratory effort. No accessory muscle use.  Cardiovascular: Regular rate and rhythm, no murmurs / rubs / gallops.  1+ pitting edema Abdomen: non distended, no tenderness. Bowel sounds positive.  Musculoskeletal: no clubbing / cyanosis.   Data Reviewed: I have independently reviewed following labs and imaging studies  CBC Recent Labs  Lab 05/05/24 1743 05/06/24 0416 05/07/24 0420 05/08/24 0435  WBC 6.3 6.8 6.7 6.3  HGB 10.6* 10.4* 10.9* 11.2*  HCT 33.1* 33.1* 34.4* 35.8*  PLT 250 208 233 224  MCV 89.2 89.7 89.8 91.1  MCH 28.6 28.2 28.5 28.5  MCHC 32.0 31.4 31.7 31.3  RDW 17.8* 17.8* 18.0* 18.2*    Recent Labs  Lab 05/05/24 1743 05/06/24 0416 05/07/24 0420 05/08/24 0435  NA 143 144 143 142  K 3.6 3.4* 3.9 3.4*  CL 107 108 108 107  CO2 23 27 25 26   GLUCOSE 195* 136* 173* 179*  BUN 38* 35* 35* 32*  CREATININE 0.92 0.97 1.00 1.00  CALCIUM  8.8* 8.7* 8.6* 8.4*  AST 35 32 32 34  ALT 32 33 34 37  ALKPHOS 110 108 112 113  BILITOT 0.8 0.8 0.8 0.9  ALBUMIN  2.7* 2.5* 2.7* 2.6*  MG  --   --  1.9  --     ------------------------------------------------------------------------------------------------------------------ No results for input(s): CHOL, HDL, LDLCALC, TRIG, CHOLHDL, LDLDIRECT in the last 72  hours.  Lab Results  Component Value Date   HGBA1C 8.4 (H) 02/22/2024   ------------------------------------------------------------------------------------------------------------------ No results for input(s): TSH, T4TOTAL, T3FREE, THYROIDAB in the last 72 hours.  Invalid input(s): FREET3  Cardiac Enzymes No results for input(s): CKMB, TROPONINI, MYOGLOBIN in the last 168 hours.  Invalid input(s): CK ------------------------------------------------------------------------------------------------------------------    Component Value Date/Time   BNP 99.2 08/23/2023 1541   BNP 180.9 (H) 07/06/2022 1133   BNP 762 (H) 07/23/2021 0951    CBG: Recent Labs  Lab 05/07/24 0818 05/07/24 1039 05/07/24 1220 05/07/24 1644 05/07/24 2118  GLUCAP 218* 214* 244* 227* 232*    No results found for this or any previous visit (from the past 240 hours).   Radiology Studies: No results found.   Kathlen Para, MD, PhD [facility]  Between 7 am - 7 pm I am available, please contact me via Amion (for emergencies) or Securechat (non urgent messages)  Between 7 pm - 7 am I am not available, please contact night coverage MD/APP via Amion

## 2024-05-08 NOTE — Consult Note (Signed)
 WOC Nurse wound follow up Wound type: Left hip: unstageable  Sacral: stage 4 L shoulder: unstageable R heel unstageable  L heel DTPI L medial foot: unstageable Measurement: Left hip: 6 cm x 4 cm Sacral: 6 cm x 3 cm x 0.5 cm with undermining from 12 o'clock to 3 o'clock measuring 3 cm  L shoulder: 2 cm x 3 cm  R heel: 5 cm x 3 cm x 0.2 cm L heel: DTPI L medial foot: 4 cm x 2 cm x 0.2 cm Wound bed: Left hip: 90 % wound bed covered with slough, which is softening, boggy at medial edge of wound however edges remain attached 10% pink clean tissue ( surrounding the perimeter of slough)  Sacrum: 90% clean red moist tissue in wound bed, 10% soft yellow slough at upper edge of wound L shoulder: 90% wound bed covered with slough, slough feels hard and attached to wound bed with edges lifting. 10% pink tissue surrounding slough on all sides R heel: 90% pink moist tissue with 10% yellow slough concentrated in center of wound L heel: hard black eschar   L medial foot; yellow slough covering wound bed Drainage (amount, consistency, odor) Scant to none on all sites except sacral which has minimal purulent drainage  Periwound: All periwound intact, except L medial foot which is macerated Dressing procedure/placement/frequency: 1 - Left Hip PI unstageable Clean with Vashe ( lawson # [id number]), allow to air dry, apply santyl  to wound bed, cover with saline soaked gauze, and then cover with foam dressing, change daily   2 - Left shoulder DTPI Clean with Vashe ( lawson # [id number]), allow to air dry, apply santyl  to wound bed, cover with saline soaked gauze, and then cover with foam dressing, change daily   3 - Sacral Unstageable PI Cleanse with Vashe#[id number]. Apply Aquacel Ag+ (silver hydrofiber) Lawson # [phone] in the wound bed and fill the tunneling using a swab, cover with sacral foam dressing, change daily and PRN soilage  R heel:  Clean with Vashe ( lawson # [phone]), allow to air dry, apply santyl   to wound bed, cover with saline soaked gauze, and then cover with foam dressing, change daily  L heel:  Cover with foam dressing, change daily  L medial foot: Clean with Vashe ( lawson # [id number]), allow to air dry, apply santyl  to wound bed, cover with saline soaked gauze, and then cover with foam dressing, change daily  Both feet should be placed in Prevalon boots to offload pressure Timm Foot [phone])   Daughter at bedside for visit, plan of care discussed.  WOC team will continue to follow patient weekly, please re-consult if new needs arise.  Gillermo Lack, RN, MSN, [facility] WOC Team

## 2024-05-08 NOTE — Progress Notes (Signed)
 OT Cancellation Note  Patient Details Name: Earl [name] MRN: [id number] DOB: 08/16/1941   Cancelled Treatment:    Reason Eval/Treat Not Completed: Patient declined, no reason specified (pt lethargic after thoracentesis, will reattempt as schedule permits)  Zorianna Taliaferro K, OTD, OTR/L SecureChat Preferred Acute Rehab [phone]   Antionette Kirks 05/08/2024, 3:42 PM

## 2024-05-08 NOTE — Plan of Care (Signed)
°  Problem: Fluid Volume: °Goal: Hemodynamic stability will improve °Outcome: Progressing °  °Problem: Clinical Measurements: °Goal: Diagnostic test results will improve °Outcome: Progressing °Goal: Signs and symptoms of infection will decrease °Outcome: Progressing °  °

## 2024-05-08 NOTE — Progress Notes (Signed)
 Patient was taken for the IR procedure at 1320pm via bed.

## 2024-05-08 NOTE — Procedures (Signed)
 PROCEDURE SUMMARY:  Successful image-guided left thoracentesis. Yielded 600 mL of hazy amber fluid. Pt tolerated procedure well. No immediate complications. EBL = trace   Specimen was  sent for labs. CXR ordered.  Please see imaging section of Epic for full dictation.  Christop Hippert H Brin Ruggerio PA-C 05/08/2024 1:53 PM

## 2024-05-08 NOTE — Progress Notes (Signed)
 PT Cancellation Note  Patient Details Name: Earl [name] MRN: [id number] DOB: 1941/03/31   Cancelled Treatment:    Reason Eval/Treat Not Completed: Patient at procedure or test/unavailable. New wound therapy order received, chart reviewed. Per RN, pt about to leave the unit for thoracentesis. PT will check back as schedule allows to initiate wound therapy re-evaluation.    Christee Mervine D Najee Manninen 05/08/2024, 2:12 PM  Simone Dubois, PT, DPT Acute Rehabilitation Services Secure Chat Preferred Office: [phone]

## 2024-05-08 NOTE — Plan of Care (Signed)
   Medical records reviewed including progress notes, labs, imaging, MAR.  Attempted to see patient x2 this afternoon however he was out of room for thoracentesis. Will re-attempt tomorrow.  Thank you for your referral and allowing PMT to assist in [name]'s care.   Earl Lastinger, PA-C Palliative Medicine Team  Team Phone # [phone]   NO CHARGE

## 2024-05-09 ENCOUNTER — Inpatient Hospital Stay (HOSPITAL_COMMUNITY)

## 2024-05-09 DIAGNOSIS — Z515 Encounter for palliative care: Secondary | ICD-10-CM | POA: Diagnosis not present

## 2024-05-09 DIAGNOSIS — A419 Sepsis, unspecified organism: Secondary | ICD-10-CM | POA: Diagnosis not present

## 2024-05-09 DIAGNOSIS — R000 Tachycardia, unspecified: Secondary | ICD-10-CM | POA: Diagnosis not present

## 2024-05-09 DIAGNOSIS — I4891 Unspecified atrial fibrillation: Secondary | ICD-10-CM | POA: Diagnosis not present

## 2024-05-09 DIAGNOSIS — R6520 Severe sepsis without septic shock: Secondary | ICD-10-CM | POA: Diagnosis not present

## 2024-05-09 LAB — COMPREHENSIVE METABOLIC PANEL WITH GFR
ALT: 61 U/L — ABNORMAL HIGH (ref 0–44)
AST: 68 U/L — ABNORMAL HIGH (ref 15–41)
Albumin: 2.8 g/dL — ABNORMAL LOW (ref 3.5–5.0)
Alkaline Phosphatase: 133 U/L — ABNORMAL HIGH (ref 38–126)
Anion gap: 7 (ref 5–15)
BUN: 33 mg/dL — ABNORMAL HIGH (ref 8–23)
CO2: 26 mmol/L (ref 22–32)
Calcium: 8.6 mg/dL — ABNORMAL LOW (ref 8.9–10.3)
Chloride: 108 mmol/L (ref 98–111)
Creatinine, Ser: 0.91 mg/dL (ref 0.61–1.24)
GFR, Estimated: >60 mL/min (ref >60)
Glucose, Bld: 138 mg/dL — ABNORMAL HIGH (ref 70–99)
Potassium: 3.8 mmol/L (ref 3.5–5.1)
Sodium: 141 mmol/L (ref 135–145)
Total Bilirubin: 1.2 mg/dL (ref 0.0–1.2)
Total Protein: 6 g/dL — ABNORMAL LOW (ref 6.5–8.1)

## 2024-05-09 LAB — CBC
HCT: 35.5 % — ABNORMAL LOW (ref 39.0–52.0)
Hemoglobin: 11.2 g/dL — ABNORMAL LOW (ref 13.0–17.0)
MCH: 27.8 pg (ref 26.0–34.0)
MCHC: 31.5 g/dL (ref 30.0–36.0)
MCV: 88.1 fL (ref 80.0–100.0)
Platelets: 229 10*3/uL (ref 150–400)
RBC: 4.03 MIL/uL — ABNORMAL LOW (ref 4.22–5.81)
RDW: 18.2 % — ABNORMAL HIGH (ref 11.5–15.5)
WBC: 6.7 10*3/uL (ref 4.0–10.5)
nRBC: 0 % (ref 0.0–0.2)

## 2024-05-09 LAB — MAGNESIUM: Magnesium: 1.9 mg/dL (ref 1.7–2.4)

## 2024-05-09 LAB — GLUCOSE, CAPILLARY
Glucose-Capillary: 132 mg/dL — ABNORMAL HIGH (ref 70–99)
Glucose-Capillary: 168 mg/dL — ABNORMAL HIGH (ref 70–99)
Glucose-Capillary: 134 mg/dL — ABNORMAL HIGH (ref 70–99)
Glucose-Capillary: 143 mg/dL — ABNORMAL HIGH (ref 70–99)
Glucose-Capillary: 134 mg/dL — ABNORMAL HIGH (ref 70–99)

## 2024-05-09 LAB — CYTOLOGY - NON PAP

## 2024-05-09 MED ORDER — ALBUMIN HUMAN 25 % IV SOLN
25.0000 g | Freq: Once | INTRAVENOUS | Status: AC
  Administered 2024-05-09: 25 g via INTRAVENOUS
  Filled 2024-05-09: qty 100

## 2024-05-09 NOTE — Progress Notes (Signed)
 Daily Progress Note   Patient Name: Earl [name]       Date: 05/09/2024 DOB: 09-02-1941  Age: 83 y.o. MRN#: [id number] Attending Physician: Osborn Blaze, MD Primary Care Physician: [name], MD Admit Date: 03/04/2024  Reason for Consultation/Follow-up: Establishing goals of care  Subjective: AM: Medical records reviewed including progress notes, labs, imaging, MAR. PRN Oxycodone  given x1 in the past 24 hours. Patient assessed at the bedside. He is feeling nervous and dizzy this morning. Reports relief from thoracentesis.   I provided detailed education on the symptom management plan for the patient, including scheduled Oxycodone  and Sertraline, PRN Atarax and PRN Oxycodone . Reviewed different indications, frequencies, and onset of action. Reviewed risks and benefits. He verbalized his understanding. He would like to hold off until after breakfast to try PRN Atarax to ensure he is not too sleepy. At his request, I wrote it down so he could request RN if he decides he wants to. Shared that I would try to return after 1pm to support his daughter as well.  PM: received update from SLP that daughter was at the bedside and requested PRN Atarax. I shared with RN and returned to the bedside. Patient is still anxious despite receiving 1 dose of Atarax this morning. Daughter Para would prefer to keep to night time doses as much as possible. Discussed care plan and her feelings that he is not dying, despite her receiving phone calls with concern for his overall condition.  Questions and concerns addressed. PMT will continue to support holistically.   Length of Stay: 36   Physical Exam Vitals and nursing note reviewed.  Constitutional:      General: He is not in acute distress.    Appearance: He is ill-appearing.   Cardiovascular:      Rate and Rhythm: Normal rate.  Pulmonary:     Effort: Tachypnea present.   Skin:    General: Skin is warm and dry.   Neurological:     Mental Status: He is alert.   Psychiatric:        Mood and Affect: Mood is anxious.        Behavior: Behavior normal. Behavior is cooperative.            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    Palliative care evaluated [facility] earlier this month and have been asked to get re-involved for additional goals of care conversations.   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 Continue Oxycodone  2.5mg  PO Q4H PRN for breakthrough pain  Continue sertraline 25mg  PO daily Continue Atarax 10 mg 3 times daily as needed for anxiety PMT will see again next week   Prognosis: Concerning prognosis in light of recurrent hospitalizations, declining functional/nutritional status,  and several chronic comorbidities  Discharge Planning: Skilled Nursing Facility for rehab with Palliative care service follow-up   Care plan was discussed with patient, patient's daughter, RN, SLP   Sayward Horvath Alroy Jericho, PA-C  Palliative Medicine Team Team phone # [phone]  Thank you for allowing the Palliative Medicine Team to assist in the care of this patient. Please utilize secure chat with additional questions, if there is no response within 30 minutes please call the above phone number.  Palliative Medicine Team providers are available by phone from 7am to 7pm daily and can be reached through the team cell phone.  Should this patient require assistance outside of these hours, please call the patient's attending physician.     Time Total: 50  Visit consisted of counseling and education dealing with the complex and emotionally intense issues of symptom management and palliative care in the setting of serious and potentially life-threatening illness. Greater than 50% of this time was spent counseling and coordinating care related to the above assessment and plan.  Personally spent 50 minutes in patient care including extensive chart review (labs, imaging, progress/consult notes, vital signs), medically appropraite exam, discussed with treatment team, education to patient, family, and staff, documenting clinical information, medication review and management, coordination of care, and available advanced directive documents.

## 2024-05-09 NOTE — Progress Notes (Signed)
 Physical Therapy Wound Evaluation/Treatment Patient Details  Name: Earl [name] MRN: [id number] Date of Birth: Jan 19, 1941  Today's Date: 05/09/2024 Time: 1191-4782 Time Calculation (min): 63 min  Subjective  Subjective Assessment Subjective: Pt and daughter pleasant and agreeable to wound therapy. Patient and Family Stated Goals: Heal wounds Date of Onset:  (Unknown) Prior Treatments: Dressing changes, off weighting  Pain Score:  Pt premedicated for pain  Wound Assessment  Pressure Injury 02/26/24 Coccyx Mid Stage 4 - Full thickness tissue loss with exposed bone, tendon or muscle. (Active)  Wound Image   05/09/24 1509  Dressing Type Foam - Lift dressing to assess site every shift;Gauze (Comment);Silver hydrofiber;Barrier Film (skin prep) 05/09/24 1509  Dressing Changed;Clean, Dry, Intact 05/09/24 1509  Dressing Change Frequency Every 3 days 05/09/24 1509  State of Healing Early/partial granulation 05/09/24 1509  Site / Wound Assessment Pink;Yellow;Red 05/09/24 1509  % Wound base Red or Granulating 95% 05/09/24 1509  % Wound base Yellow/Fibrinous Exudate 5% 05/09/24 1509  % Wound base Black/Eschar 0% 05/09/24 1509  % Wound base Other/Granulation Tissue (Comment) 0% 05/09/24 1509  Peri-wound Assessment Intact 05/09/24 1509  Wound Length (cm) 5.7 cm 05/09/24 1509  Wound Width (cm) 3.2 cm 05/09/24 1509  Wound Depth (cm) 0.9 cm 05/09/24 1509  Wound Surface Area (cm^2) 18.24 cm^2 05/09/24 1509  Wound Volume (cm^3) 16.42 cm^3 05/09/24 1509  Tunneling (cm) 3 05/01/24 0900  Undermining (cm) 2.1 cm at 10:00, 2.2 cm at 12:00, 2.9 cm 12:00-2:00 05/09/24 1509  Margins Unattached edges (unapproximated) 05/09/24 1509  Drainage Amount Minimal 05/09/24 1509  Drainage Description Purulent;No odor 05/09/24 1509  Treatment Debridement (Selective);Irrigation;Other (Comment) 05/09/24 1509     Pressure Injury 03/06/24 Hip Anterior;Left;Proximal Unstageable - Full thickness tissue loss in which  the base of the injury is covered by slough (yellow, tan, gray, green or brown) and/or eschar (tan, brown or black) in the wound bed. unstageable (Active)  Wound Image   05/09/24 1509  Dressing Type Foam - Lift dressing to assess site every shift;Gauze (Comment);Santyl ;Barrier Film (skin prep);Moist to moist 05/09/24 1509  Dressing Changed;Clean, Dry, Intact 05/09/24 1509  Dressing Change Frequency Daily 05/09/24 1509  State of Healing Eschar 05/09/24 1509  Site / Wound Assessment Black;Yellow 05/09/24 1509  % Wound base Red or Granulating 0% 05/09/24 1509  % Wound base Yellow/Fibrinous Exudate 30% 05/09/24 1509  % Wound base Black/Eschar 70% 05/09/24 1509  % Wound base Other/Granulation Tissue (Comment) 0% 05/09/24 1509  Peri-wound Assessment Intact 05/09/24 1509  Wound Length (cm) 4.7 cm 05/09/24 1509  Wound Width (cm) 3.2 cm 05/09/24 1509  Wound Depth (cm) 0.1 cm 05/09/24 1509  Wound Surface Area (cm^2) 15.04 cm^2 05/09/24 1509  Wound Volume (cm^3) 1.5 cm^3 05/09/24 1509  Margins Unattached edges (unapproximated) 05/09/24 1509  Drainage Amount Minimal 05/09/24 1509  Drainage Description Purulent 05/09/24 1509  Treatment Debridement (Selective);Irrigation;Packing (Saline gauze) 05/09/24 1509      Selective Debridement (non-excisional) Selective Debridement (non-excisional) - Location: Sacrum, L hip Selective Debridement (non-excisional) - Tools Used: Forceps, Scalpel Selective Debridement (non-excisional) - Tissue Removed: yellow unviable tissue and eschar    Wound Assessment and Plan  Wound Therapy - Assess/Plan/Recommendations Wound Therapy - Clinical Statement: PT reconsulted for assessment of sacral wound, and new assessment of L hip. Sacrum appears overall clean with minimal yellow tissue present. The nonviable tissue appeared to be within the fibers of the healthy tissue and was not easily accessible to debride. Noted Aquacel saturated with purulence upon dressing removal and  was not  packed in areas of undermining where the Aquacel is needed most. Reached out to [facility] RN regarding possibility of a different dressing option to encourage proper packing of wound. MD Gherghe present during session and agreeable to wound therapy following L hip. Debridement initiated. This patient will benefit from continued wound therapy of the L hip for selective removal of unviable tissue, to decrease bioburden, and promote wound bed healing. Will continue to follow. Wound Therapy - Functional Problem List: Decreased tolerance for position changes or OOB. Factors Delaying/Impairing Wound Healing: Diabetes Mellitus, Immobility, Infection - systemic/local, Multiple medical problems Hydrotherapy Plan: Debridement, Dressing change, Patient/family education Wound Therapy - Frequency: 2X / week Wound Therapy - Follow Up Recommendations: dressing changes by RN  Wound Therapy Goals- Improve the function of patient's integumentary system by progressing the wound(s) through the phases of wound healing (inflammation - proliferation - remodeling) by: Wound Therapy Goals - Improve the function of patient's integumentary system by progressing the wound(s) through the phases of wound healing by: Decrease Necrotic Tissue to: 20% Decrease Necrotic Tissue - Progress: Goal set today Increase Granulation Tissue to: 80% Increase Granulation Tissue - Progress: Goal set today Goals/treatment plan/discharge plan were made with and agreed upon by patient/family: Yes Time For Goal Achievement: 7 days Wound Therapy - Potential for Goals: Good  Goals will be updated until maximal potential achieved or discharge criteria met.  Discharge criteria: when goals achieved, discharge from hospital, MD decision/surgical intervention, no progress towards goals, refusal/missing three consecutive treatments without notification or medical reason.  GP     Charges PT Wound Care Charges $Wound Debridement up to 20 cm: < or  equal to 20 cm $PT Hydrotherapy Dressing: 2 dressings $PT Hydrotherapy Visit: 1 Visit       Earl [name] 05/09/2024, 3:25 PM  Earl [name], PT, DPT Acute Rehabilitation Services Secure Chat Preferred Office: [phone]

## 2024-05-09 NOTE — Progress Notes (Signed)
 Speech Language Pathology Treatment:  (speech/voice)  Patient Details Name: Joell Usman MRN: [id number] DOB: 03-31-1941 Today's Date: 05/09/2024 Time: 5621-3086 SLP Time Calculation (min) (ACUTE ONLY): 20 min  Assessment / Plan / Recommendation Clinical Impression  Patient seen by SLP for skilled treatment focused on speech and voice with his daughter Para present for majority of the session. Patient himself appeared anxious, uncomfortable in general. Voice was significantly dysphonic, high pitched/strained quality. Para expressed her concerns regarding his vocal quality as well as his poor PO intake. She stated that they were able to coordinate him getting to outside for his [birth year] ([birth year]) but he seems to have declined since then in his cognition as well as PO intake. SLP discussed how patient has been hospitalized for the past two months and that hospital delirium is likely contributing. In addition, patient is very weak. Patient was too anxious to attempt focusing on EMST at this time and so session was primarily education and discussion of his deficits and his daughter's concerns regarding his decline. SLP will continue to follow to determine benefit from skilled intervention to improve patient's speech and vocal quality.   HPI HPI: Patient is an 83 y.o. 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 He was discharged from the hospital (admitted for acute metabolic encephalopathy, hypotensive, hypoglycemic) to [facility] for rehab. He presented back to the hospital four days later on 03/04/24 with SOB and pain in sacral area. 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 SLP previous saw pt to assess and manage concerns for dysphagia. SLP re-consulted on  05/02/24 for voice assessment.      SLP Plan  Continue with current plan of care          Recommendations                     Oral care BID   Frequent or constant Supervision/Assistance Aphonia (R49.1);Dysarthria and anarthria (R47.1)     Continue with current plan of care     Jacqualine Mater, MA, CCC-SLP Speech Therapy

## 2024-05-09 NOTE — Progress Notes (Signed)
 PROGRESS NOTE  Peng Mcmichael ZOX:[id number] DOB: December 16, 1940 DOA: 03/04/2024 PCP: [name], MD   LOS: 66 days   Brief Narrative / Interim history: 83 year old male with history of PAF on Eliquis , IDDM 2, HTN, CVA with right-sided residual weakness, MGUS, BPH, systolic CHF who is here with sacral pain.  Of note, he was hospitalized 3/26-4/10 with generalized weakness/acute encephalopathy, hypotension, and was COVID PCR positive.  He was eventually discharged to SNF and readmitted 4 days later with pain in the sacral area and shortness of breath.  He was found to be in A-fib with RVR, tachypneic and was hypotensive on admission for which critical care was consulted.  General surgery was consulted for sacral wound debridement status post bedside excision 4/15 and 03/07/2024.  Subjective / 24h Interval events: He is awake this morning, alert.  He tells me that his breathing is better today.  Denies any chest discomfort, no nausea or vomiting  Assesement and Plan: Principal problem Severe sepsis-on admission, patient had lactic acidosis, white count, fever secondary to infected chronic wound.  He has received multiple antibiotics during his long hospitalization.  Sepsis physiology has now resolved and he is currently afebrile, normotensive, not tachycardic and white count is normal at 6.7 today  Active problems Stage IV sacral decubitus wound/ulcer-repeat CT scan on 6/16 with some cortical irregularity along the underlying dorsal mid sacrum, concerning for osteomyelitis, findings appearing to be new.  Infectious disease MD reconsulted and Dr. Gillian Lacrosse reviewed the case, did not recommend antibiotics but wound care alone, with plans for antibiotics if and when signs of infection - Dr. Carolynne Citron with plastic surgery evaluated patient 6/17, appears that he has a clean sacral decubitus, not a candidate for any type of flap closure.  Per his note, patient nor daughter were interested in surgery  at this time.  Plastic surgery also recommends wound care - He most recently finished [facility]  on 6/13, most recent wound culture done on 6/4 showed Pseudomonas and Klebsiella  Acute on chronic pain-secondary to above, continue pain regimen with oxycodone  as below  Acute on chronic combined CHF, cardiac amyloidosis, nonischemic cardiomyopathy -on tafamidis  and vutrisiran  for amyloid.  Goal-directed medical therapy somewhat limited by prior episodes of hypotension.  Currently there is evidence that he is having anasarca/fluid overload, has been placed on furosemide , continue.  Kidney function is stable today, albumin  x 1 again  PAF-continue amiodarone  and Eliquis   Anemia of chronic disease-received a unit of packed red blood cells on 4/30.  Hemoglobin is stable and changed since yesterday  Dysphagia/severe protein calorie malnutrition-continue supplementation, pured diet  Physical debilitation, muscle weakness-PT/OT ongoing  Goals of care -palliative consulted as well, appears to be DNR.  Daughter wants him to transition at home once his wounds are better  IDDM -continue Semglee , sliding scale, Accu-Cheks  CBG (last 3)  Recent Labs    05/08/24 1802 05/08/24 2112 05/09/24 0834  GLUCAP 168* 121* 134*   Lab Results  Component Value Date   HGBA1C 8.4 (H) 02/22/2024   Scheduled Meds:  acetaminophen   500 mg Oral TID   amiodarone   200 mg Oral Daily   apixaban   5 mg Oral BID   atorvastatin   40 mg Oral Daily   Chlorhexidine  Gluconate Cloth  6 each Topical Daily   collagenase    Topical Daily   diclofenac  Sodium  4 g Topical QID   feeding supplement  237 mL Oral BID BM   ferrous sulfate   325 mg Oral QHS   furosemide   40  mg Intravenous BID   guaiFENesin   600 mg Oral BID   insulin  aspart  0-5 Units Subcutaneous QHS   insulin  aspart  0-9 Units Subcutaneous TID WC   insulin  glargine-yfgn  8 Units Subcutaneous Daily   levothyroxine   50 mcg Oral Q0600   liver oil-zinc  oxide   Topical BID    multivitamin with minerals  1 tablet Oral Daily   nutrition supplement (JUVEN)  1 packet Oral BID BM   mouth rinse  15 mL Mouth Rinse 4 times per day   oxyCODONE   5 mg Oral QHS   phosphorus  500 mg Oral TID   polyethylene glycol  17 g Oral BID   senna-docusate  1 tablet Oral QHS   sertraline  25 mg Oral Daily   sodium chloride  flush  10-40 mL Intracatheter Q12H   sodium chloride  flush  3 mL Intravenous Q12H   Tafamidis   1 capsule Oral Daily   vutrisiran  sodium  25 mg Subcutaneous Q90 days   Continuous Infusions:  dextrose  Stopped (05/01/24 0003)   PRN Meds:.acetaminophen  **OR** acetaminophen , food thickener, hydrOXYzine, lip balm, ondansetron  **OR** ondansetron  (ZOFRAN ) IV, mouth rinse, oxyCODONE , polyethylene glycol, artificial tears, sodium chloride  flush, sodium chloride  flush  Current Outpatient Medications  Medication Instructions   Amino Acids -Protein Hydrolys (PRO-STAT AWC) LIQD 30 mLs, Oral, 2 times daily   AMVUTTRA  25 MG/0.5ML syringe INJECT 25MG  (0.5ML) SUBCUTANEOUSLY ONCE EVERY 3 MONTHS. ROTATE ADMINISTRATION SITE WITH EACH INJECTION. *REFRIGERATE. DO NOT SHAKE.*   ascorbic acid  (VITAMIN C ) 500 mg, Oral, Daily,     atorvastatin  (LIPITOR) 40 MG tablet Take  1 tablet  Daily for Cholesterol                                                       /                                  TAKE                                                       BY                                           MOUTH   B-12 50 mcg, Oral, Daily   Blood Glucose Monitoring Suppl (ONE TOUCH ULTRA 2) w/Device KIT Check blood sugar 1 time a day-DX-E11.9   Cholecalciferol  (VITAMIN D  PO) 1 tablet, Oral, Daily   ELIQUIS  5 MG TABS tablet TAKE 1 TABLET BY MOUTH TWICE  DAILY TO PREVENT BLOOD CLOTS   Emollient (AQUAPHOR ADV PROTECT HEALING) OINT Apply small amount to two areas on buttocks daily to prevent breakdown   ferrous sulfate  325 mg, Oral, Daily at bedtime   finasteride  (PROSCAR ) 5 MG tablet TAKE 1 TABLET BY  MOUTH DAILY FOR PROSTATE   glucose blood (ONETOUCH ULTRA) test strip Use as instructed   Lancets (ONETOUCH DELICA PLUS LANCET30G) MISC CHECK BLOOD SUGAR ONCE A DAY.   Lancets Misc. (ONE TOUCH SURESOFT)  MISC Check blood sugar 1 time a day-DX-E11.91   levothyroxine  (SYNTHROID ) 50 MCG tablet TAKE 1 TABLET BY MOUTH DAILY ON  AN EMPTY STOMACH WITH ONLY WATER FOR 30 MINUTES AND NO ANTACID,  MEDS, CALCIUM  OR MAGNESIUM  FOR 4 HOURS AND AVOID BIOTIN   Magnesium  250 mg, Oral, Daily   metFORMIN  (GLUCOPHAGE -XR) 500 MG 24 hr tablet TAKE 2 TABLETS BY MOUTH TWICE  DAILY WITH MEALS FOR DIABETES   metoprolol  succinate (TOPROL -XL) 25 mg, Oral, Daily at bedtime   Multiple Vitamin (MULTIVITAMIN) capsule 1 capsule, Oral, Daily,     NONFORMULARY OR COMPOUNDED ITEM Please dispense a Rolling Walker.  Dx:R26.81 & G63   Nutritional Supplements (BOOST GLUCOSE CONTROL) LIQD 237 mLs, Oral, 2 times daily after meals   potassium chloride  SA (KLOR-CON  M20) 20 MEQ tablet 20 mEq, Oral, Daily   sacubitril -valsartan  (ENTRESTO ) 24-26 MG 1 tablet, Oral, 2 times daily   Tafamidis  (VYNDAMAX ) 61 MG CAPS Take 1 capsule by mouth daily.    Diet Orders (From admission, onward)     Start     Ordered   04/30/24 1436  DIET - DYS 1 Room service appropriate? No; Fluid consistency: Thin  Diet effective now       Question Answer Comment  Room service appropriate? No   Fluid consistency: Thin      04/30/24 1436            DVT prophylaxis: SCDs Start: 03/04/24 2151 Place TED hose Start: 03/04/24 2151 apixaban  (ELIQUIS ) tablet 5 mg   Lab Results  Component Value Date   PLT 229 05/09/2024      Code Status: Limited: Do not attempt resuscitation (DNR) -DNR-LIMITED -Do Not Intubate/DNI   Family Communication: No family at bedside  Status is: Inpatient Remains inpatient appropriate because: severity of illness  Level of care: Telemetry Medical  Consultants:  Palliative ID Surgery   Objective: Vitals:   05/09/24 0414  05/09/24 0607 05/09/24 0728 05/09/24 0728  BP: 109/70   (!) 123/59  Pulse: 67  80 78  Resp: 16  18   Temp: 97.8 F (36.6 C)  (!) 97.5 F (36.4 C)   TempSrc: Oral  Oral   SpO2: 100%  99%   Weight:  79.8 kg    Height:        Intake/Output Summary (Last 24 hours) at 05/09/2024 0850 Last data filed at 05/09/2024 0554 Gross per 24 hour  Intake --  Output 2100 ml  Net -2100 ml   Wt Readings from Last 3 Encounters:  05/09/24 79.8 kg  02/14/24 72.1 kg  01/24/24 72.1 kg    Examination:  Constitutional: NAD Eyes: lids and conjunctivae normal, no scleral icterus ENMT: mmm Neck: normal, supple Respiratory: clear to auscultation bilaterally, no wheezing, no crackles. Normal respiratory effort.  Cardiovascular: Regular rate and rhythm, no murmurs / rubs / gallops. No LE edema. Abdomen: soft, no distention, no tenderness. Bowel sounds positive.   Data Reviewed: I have independently reviewed following labs and imaging studies  CBC Recent Labs  Lab 05/05/24 1743 05/06/24 0416 05/07/24 0420 05/08/24 0435 05/09/24 0349  WBC 6.3 6.8 6.7 6.3 6.7  HGB 10.6* 10.4* 10.9* 11.2* 11.2*  HCT 33.1* 33.1* 34.4* 35.8* 35.5*  PLT 250 208 233 224 229  MCV 89.2 89.7 89.8 91.1 88.1  MCH 28.6 28.2 28.5 28.5 27.8  MCHC 32.0 31.4 31.7 31.3 31.5  RDW 17.8* 17.8* 18.0* 18.2* 18.2*    Recent Labs  Lab 05/05/24 1743 05/06/24 0416 05/07/24 0420 05/08/24 0435  05/09/24 0349  NA 143 144 143 142 141  K 3.6 3.4* 3.9 3.4* 3.8  CL 107 108 108 107 108  CO2 23 27 25 26 26   GLUCOSE 195* 136* 173* 179* 138*  BUN 38* 35* 35* 32* 33*  CREATININE 0.92 0.97 1.00 1.00 0.91  CALCIUM  8.8* 8.7* 8.6* 8.4* 8.6*  AST 35 32 32 34 68*  ALT 32 33 34 37 61*  ALKPHOS 110 108 112 113 133*  BILITOT 0.8 0.8 0.8 0.9 1.2  ALBUMIN  2.7* 2.5* 2.7* 2.6* 2.8*  MG  --   --  1.9  --  1.9    ------------------------------------------------------------------------------------------------------------------ No results for  input(s): CHOL, HDL, LDLCALC, TRIG, CHOLHDL, LDLDIRECT in the last 72 hours.  Lab Results  Component Value Date   HGBA1C 8.4 (H) 02/22/2024   ------------------------------------------------------------------------------------------------------------------ No results for input(s): TSH, T4TOTAL, T3FREE, THYROIDAB in the last 72 hours.  Invalid input(s): FREET3  Cardiac Enzymes No results for input(s): CKMB, TROPONINI, MYOGLOBIN in the last 168 hours.  Invalid input(s): CK ------------------------------------------------------------------------------------------------------------------    Component Value Date/Time   BNP 99.2 08/23/2023 1541   BNP 180.9 (H) 07/06/2022 1133   BNP 762 (H) 07/23/2021 0951    CBG: Recent Labs  Lab 05/08/24 1217 05/08/24 1718 05/08/24 1802 05/08/24 2112 05/09/24 0834  GLUCAP 273* 132* 168* 121* 134*    Recent Results (from the past 240 hours)  Body fluid culture w Gram Stain     Status: None (Preliminary result)   Collection Time: 05/08/24  1:50 PM   Specimen: Lung, Left; Pleural Fluid  Result Value Ref Range Status   Specimen Description FLUID  Final   Special Requests NONE  Final   Gram Stain   Final    NO WBC SEEN NO ORGANISMS SEEN Performed at [facility], [address], [place], [place] [address]    Culture PENDING  Incomplete   Report Status PENDING  Incomplete     Radiology Studies: IR THORACENTESIS ASP PLEURAL SPACE W/IMG GUIDE Result Date: 05/08/2024 INDICATION: Systolic heart failure with pleural effusions. Request for diagnostic and therapeutic thoracentesis. EXAM: ULTRASOUND GUIDED LEFT THORACENTESIS MEDICATIONS: 1% lidocaine  10 mL COMPLICATIONS: None immediate. PROCEDURE: An ultrasound guided thoracentesis was thoroughly discussed with the patient's daughter and questions answered. The benefits, risks, alternatives and complications were also discussed. The patient understands and wishes  to proceed with the procedure. Written consent was obtained. Ultrasound was performed to localize and mark an adequate pocket of fluid in the left chest. The area was then prepped and draped in the normal sterile fashion. 1% Lidocaine  was used for local anesthesia. Under ultrasound guidance a 6 Fr Safe-T-Centesis catheter was introduced. Thoracentesis was performed. The catheter was removed and a dressing applied. FINDINGS: A total of approximately 600 mL of hazy amber fluid was removed. Samples were sent to the laboratory as requested by the clinical team. IMPRESSION: Successful ultrasound guided left thoracentesis yielding 600 mL of pleural fluid. No pneumothorax on post-procedure chest x-ray. Procedure performed by Darcus Eastern PA-C Electronically Signed   By: Fernando Hoyer M.D.   On: 05/08/2024 16:32   DG Chest 1 View Result Date: 05/08/2024 CLINICAL DATA:  Left pleural effusion status post thoracentesis EXAM: CHEST  1 VIEW COMPARISON:  04/24/2024, 05/06/2024 FINDINGS: Two frontal views of the chest demonstrates stable enlargement of the cardiac silhouette. Interval left thoracentesis, with small residual left pleural effusion. Stable veiling opacity at the right lung base consistent with moderate right pleural effusion. No pneumothorax. No  acute bony abnormalities. IMPRESSION: 1. Decreased left pleural effusion after interval thoracentesis. No evidence of pneumothorax. 2. Stable moderate right pleural effusion. Electronically Signed   By: Bobbye Burrow M.D.   On: 05/08/2024 15:40     Kathlen Para, MD, PhD [facility]  Between 7 am - 7 pm I am available, please contact me via Amion (for emergencies) or Securechat (non urgent messages)  Between 7 pm - 7 am I am not available, please contact night coverage MD/APP via Amion

## 2024-05-10 ENCOUNTER — Other Ambulatory Visit: Payer: Self-pay

## 2024-05-10 DIAGNOSIS — A419 Sepsis, unspecified organism: Secondary | ICD-10-CM | POA: Diagnosis not present

## 2024-05-10 DIAGNOSIS — R6520 Severe sepsis without septic shock: Secondary | ICD-10-CM | POA: Diagnosis not present

## 2024-05-10 LAB — GLUCOSE, CAPILLARY
Glucose-Capillary: 102 mg/dL — ABNORMAL HIGH (ref 70–99)
Glucose-Capillary: 124 mg/dL — ABNORMAL HIGH (ref 70–99)
Glucose-Capillary: 257 mg/dL — ABNORMAL HIGH (ref 70–99)
Glucose-Capillary: 263 mg/dL — ABNORMAL HIGH (ref 70–99)
Glucose-Capillary: 147 mg/dL — ABNORMAL HIGH (ref 70–99)

## 2024-05-10 MED ORDER — OXYCODONE HCL 5 MG PO TABS
5.0000 mg | ORAL_TABLET | Freq: Every day | ORAL | Status: DC
  Administered 2024-05-10 – 2024-05-12 (×3): 5 mg via ORAL
  Filled 2024-05-10 (×5): qty 1

## 2024-05-10 NOTE — Plan of Care (Signed)
  Problem: Fluid Volume: Goal: Hemodynamic stability will improve Outcome: Progressing   Problem: Clinical Measurements: Goal: Diagnostic test results will improve Outcome: Progressing Goal: Signs and symptoms of infection will decrease Outcome: Progressing   Problem: Respiratory: Goal: Ability to maintain adequate ventilation will improve Outcome: Progressing   Problem: Education: Goal: Knowledge of risk factors and measures for prevention of condition will improve Outcome: Progressing   Problem: Coping: Goal: Psychosocial and spiritual needs will be supported Outcome: Progressing   Problem: Respiratory: Goal: Will maintain a patent airway Outcome: Progressing Goal: Complications related to the disease process, condition or treatment will be avoided or minimized Outcome: Progressing   Problem: Education: Goal: Ability to describe self-care measures that may prevent or decrease complications (Diabetes Survival Skills Education) will improve Outcome: Progressing   Problem: Coping: Goal: Ability to adjust to condition or change in health will improve Outcome: Progressing   Problem: Fluid Volume: Goal: Ability to maintain a balanced intake and output will improve Outcome: Progressing   Problem: Health Behavior/Discharge Planning: Goal: Ability to manage health-related needs will improve Outcome: Progressing   Problem: Metabolic: Goal: Ability to maintain appropriate glucose levels will improve Outcome: Progressing   Problem: Nutritional: Goal: Maintenance of adequate nutrition will improve Outcome: Progressing Goal: Progress toward achieving an optimal weight will improve Outcome: Progressing   Problem: Skin Integrity: Goal: Risk for impaired skin integrity will decrease Outcome: Progressing   Problem: Tissue Perfusion: Goal: Adequacy of tissue perfusion will improve Outcome: Progressing   Problem: Education: Goal: Knowledge of General Education information  will improve Description: Including pain rating scale, medication(s)/side effects and non-pharmacologic comfort measures Outcome: Progressing   Problem: Health Behavior/Discharge Planning: Goal: Ability to manage health-related needs will improve Outcome: Progressing   Problem: Clinical Measurements: Goal: Ability to maintain clinical measurements within normal limits will improve Outcome: Progressing Goal: Will remain free from infection Outcome: Progressing Goal: Diagnostic test results will improve Outcome: Progressing Goal: Respiratory complications will improve Outcome: Progressing Goal: Cardiovascular complication will be avoided Outcome: Progressing   Problem: Activity: Goal: Risk for activity intolerance will decrease Outcome: Progressing   Problem: Nutrition: Goal: Adequate nutrition will be maintained Outcome: Progressing   Problem: Coping: Goal: Level of anxiety will decrease Outcome: Progressing   Problem: Elimination: Goal: Will not experience complications related to bowel motility Outcome: Progressing Goal: Will not experience complications related to urinary retention Outcome: Progressing   Problem: Pain Managment: Goal: General experience of comfort will improve and/or be controlled Outcome: Progressing   Problem: Safety: Goal: Ability to remain free from injury will improve Outcome: Progressing   Problem: Skin Integrity: Goal: Risk for impaired skin integrity will decrease Outcome: Progressing

## 2024-05-10 NOTE — Progress Notes (Signed)
 Arlin Benes [id number] Liaison Note:  [facility] was previously notified of request for outpatient palliative care services at discharge. We discussed with family member early on in hospitalization. No decision was made at that time.   Please contact us  if outpatient palliative services are desired after discharge.   Thank you for the opportunity to participate in this patient's care.   Madelene Schanz, BSN, RN, OCN [employer] [phone]

## 2024-05-10 NOTE — Progress Notes (Signed)
 PROGRESS NOTE  Earl [name] ZOX:[id number] DOB: [birth year] DOA: 03/04/2024 PCP: [name], MD   LOS: 67 days   Brief Narrative / Interim history: 83 year old male with history of PAF on Eliquis , IDDM 2, HTN, CVA with right-sided residual weakness, MGUS, BPH, systolic CHF who is here with sacral pain.  Of note, he was hospitalized 3/26-4/10 with generalized weakness/acute encephalopathy, hypotension, and was COVID PCR positive.  He was eventually discharged to SNF and readmitted 4 days later with pain in the sacral area and shortness of breath.  He was found to be in A-fib with RVR, tachypneic and was hypotensive on admission for which critical care was consulted.  General surgery was consulted for sacral wound debridement status post bedside excision 4/15 and 03/07/2024.  Subjective / 24h Interval events: He is awake this morning, about to eat breakfast.  He complains of abdominal discomfort, but denies pain, tells me that he wishes he would be positioned differently for the food to slide down better, and reports that his abdominal pain is only in this position.  No nausea or vomiting.  Has been moving his bowels  Assesement and Plan: Principal problem Severe sepsis-on admission, patient had lactic acidosis, white count, fever secondary to infected chronic wound.  He has received multiple antibiotics during his long hospitalization.  Sepsis physiology has now resolved and he is currently afebrile, normotensive, not tachycardic and white count is normal at 6.7 today  Active problems Stage IV sacral decubitus wound/ulcer-repeat CT scan on 6/16 with some cortical irregularity along the underlying dorsal mid sacrum, concerning for osteomyelitis, findings appearing to be new.  Infectious disease MD reconsulted and Dr. Gillian Lacrosse reviewed the case, did not recommend antibiotics but wound care alone, with plans for antibiotics if and when signs of infection - Dr. Carolynne Citron with plastic surgery  evaluated patient 6/17, appears that he has a clean sacral decubitus, not a candidate for any type of flap closure.  Per his note, patient nor daughter were interested in surgery at this time.  Plastic surgery also recommends wound care - He most recently finished [facility]az  on 6/13, most recent wound culture done on 6/4 showed Pseudomonas and Klebsiella - Wound care reevaluated, discussed with PT, have been using Aquacel but it appears that on most recent dressing change it appears that it was not covering certain areas, discussed with wound care and change to Vashe today, reevaluate in a few days  Acute on chronic pain-secondary to above, continue pain regimen with oxycodone  as below  Acute on chronic combined CHF, cardiac amyloidosis, nonischemic cardiomyopathy -on tafamidis  and vutrisiran  for amyloid.  Goal-directed medical therapy somewhat limited by prior episodes of hypotension.  Currently there is evidence that he is having anasarca/fluid overload, has been placed on furosemide , continue.  Overall fluid status improving.  Recheck renal function in the morning  PAF-continue amiodarone  and Eliquis   Anemia of chronic disease-received a unit of packed red blood cells on 4/30.  Hemoglobin stable, recheck tomorrow  Dysphagia/severe protein calorie malnutrition-continue supplementation, pured diet  Physical debilitation, muscle weakness-PT/OT ongoing  Goals of care -palliative consulted as well, appears to be DNR.  Daughter wants him to transition at home once his wounds are better  IDDM -continue Semglee , sliding scale, Accu-Cheks  CBG (last 3)  Recent Labs    05/09/24 1724 05/09/24 2054 05/10/24 0740  GLUCAP 134* 102* 124*   Lab Results  Component Value Date   HGBA1C 8.4 (H) 02/22/2024   Scheduled Meds:  acetaminophen   500 mg Oral TID  amiodarone   200 mg Oral Daily   apixaban   5 mg Oral BID   atorvastatin   40 mg Oral Daily   Chlorhexidine  Gluconate Cloth  6 each Topical Daily    collagenase    Topical Daily   diclofenac  Sodium  4 g Topical QID   feeding supplement  237 mL Oral BID BM   ferrous sulfate   325 mg Oral QHS   furosemide   40 mg Intravenous BID   guaiFENesin   600 mg Oral BID   insulin  aspart  0-5 Units Subcutaneous QHS   insulin  aspart  0-9 Units Subcutaneous TID WC   insulin  glargine-yfgn  8 Units Subcutaneous Daily   levothyroxine   50 mcg Oral Q0600   liver oil-zinc  oxide   Topical BID   multivitamin with minerals  1 tablet Oral Daily   nutrition supplement (JUVEN)  1 packet Oral BID BM   mouth rinse  15 mL Mouth Rinse 4 times per day   oxyCODONE   5 mg Oral QHS   phosphorus  500 mg Oral TID   polyethylene glycol  17 g Oral BID   senna-docusate  1 tablet Oral QHS   sertraline  25 mg Oral Daily   sodium chloride  flush  10-40 mL Intracatheter Q12H   sodium chloride  flush  3 mL Intravenous Q12H   Tafamidis   1 capsule Oral Daily   vutrisiran  sodium  25 mg Subcutaneous Q90 days   Continuous Infusions:  dextrose  Stopped (05/01/24 0003)   PRN Meds:.acetaminophen  **OR** acetaminophen , food thickener, hydrOXYzine, lip balm, ondansetron  **OR** ondansetron  (ZOFRAN ) IV, mouth rinse, oxyCODONE , polyethylene glycol, artificial tears, sodium chloride  flush, sodium chloride  flush  Current Outpatient Medications  Medication Instructions   Amino Acids -Protein Hydrolys (PRO-STAT AWC) LIQD 30 mLs, Oral, 2 times daily   AMVUTTRA  25 MG/0.5ML syringe INJECT 25MG  (0.5ML) SUBCUTANEOUSLY ONCE EVERY 3 MONTHS. ROTATE ADMINISTRATION SITE WITH EACH INJECTION. *REFRIGERATE. DO NOT SHAKE.*   ascorbic acid  (VITAMIN C ) 500 mg, Oral, Daily,     atorvastatin  (LIPITOR) 40 MG tablet Take  1 tablet  Daily for Cholesterol                                                       /                                  TAKE                                                       BY                                           MOUTH   B-12 50 mcg, Oral, Daily   Blood Glucose Monitoring Suppl (ONE  TOUCH ULTRA 2) w/Device KIT Check blood sugar 1 time a day-DX-E11.9   Cholecalciferol  (VITAMIN D  PO) 1 tablet, Oral, Daily   ELIQUIS  5 MG TABS tablet TAKE 1 TABLET BY MOUTH TWICE  DAILY TO PREVENT BLOOD CLOTS   Emollient (AQUAPHOR ADV  PROTECT HEALING) OINT Apply small amount to two areas on buttocks daily to prevent breakdown   ferrous sulfate  325 mg, Oral, Daily at bedtime   finasteride  (PROSCAR ) 5 MG tablet TAKE 1 TABLET BY MOUTH DAILY FOR PROSTATE   glucose blood (ONETOUCH ULTRA) test strip Use as instructed   Lancets (ONETOUCH DELICA PLUS LANCET30G) MISC CHECK BLOOD SUGAR ONCE A DAY.   Lancets Misc. (ONE TOUCH SURESOFT) MISC Check blood sugar 1 time a day-DX-E11.91   levothyroxine  (SYNTHROID ) 50 MCG tablet TAKE 1 TABLET BY MOUTH DAILY ON  AN EMPTY STOMACH WITH ONLY WATER FOR 30 MINUTES AND NO ANTACID,  MEDS, CALCIUM  OR MAGNESIUM  FOR 4 HOURS AND AVOID BIOTIN   Magnesium  250 mg, Oral, Daily   metFORMIN  (GLUCOPHAGE -XR) 500 MG 24 hr tablet TAKE 2 TABLETS BY MOUTH TWICE  DAILY WITH MEALS FOR DIABETES   metoprolol  succinate (TOPROL -XL) 25 mg, Oral, Daily at bedtime   Multiple Vitamin (MULTIVITAMIN) capsule 1 capsule, Oral, Daily,     NONFORMULARY OR COMPOUNDED ITEM Please dispense a Rolling Walker.  Dx:R26.81 & G63   Nutritional Supplements (BOOST GLUCOSE CONTROL) LIQD 237 mLs, Oral, 2 times daily after meals   potassium chloride  SA (KLOR-CON  M20) 20 MEQ tablet 20 mEq, Oral, Daily   sacubitril -valsartan  (ENTRESTO ) 24-26 MG 1 tablet, Oral, 2 times daily   Tafamidis  (VYNDAMAX ) 61 MG CAPS Take 1 capsule by mouth daily.    Diet Orders (From admission, onward)     Start     Ordered   04/30/24 1436  DIET - DYS 1 Room service appropriate? No; Fluid consistency: Thin  Diet effective now       Question Answer Comment  Room service appropriate? No   Fluid consistency: Thin      04/30/24 1436            DVT prophylaxis: SCDs Start: 03/04/24 2151 Place TED hose Start: 03/04/24  2151 apixaban  (ELIQUIS ) tablet 5 mg   Lab Results  Component Value Date   PLT 229 05/09/2024      Code Status: Limited: Do not attempt resuscitation (DNR) -DNR-LIMITED -Do Not Intubate/DNI   Family Communication: No family at bedside  Status is: Inpatient Remains inpatient appropriate because: severity of illness  Level of care: Telemetry Medical  Consultants:  Palliative ID Surgery   Objective: Vitals:   05/09/24 2202 05/09/24 2245 05/10/24 0426 05/10/24 0740  BP: 112/77 121/83 (!) 121/58 126/68  Pulse: 77 80 75 77  Resp: 18 17 18 18   Temp: 97.6 F (36.4 C) 97.8 F (36.6 C) (!) 97.5 F (36.4 C) (!) 97.2 F (36.2 C)  TempSrc: Oral Oral Oral   SpO2: 99% 97% 100% 92%  Weight:      Height:        Intake/Output Summary (Last 24 hours) at 05/10/2024 1058 Last data filed at 05/10/2024 0900 Gross per 24 hour  Intake 0 ml  Output 2125 ml  Net -2125 ml   Wt Readings from Last 3 Encounters:  05/09/24 79.8 kg  02/14/24 72.1 kg  01/24/24 72.1 kg    Examination:  Constitutional: NAD Eyes: lids and conjunctivae normal, no scleral icterus ENMT: mmm Neck: normal, supple Respiratory: clear to auscultation bilaterally, no wheezing, no crackles. Normal respiratory effort.  Cardiovascular: Regular rate and rhythm, no murmurs / rubs / gallops. No LE edema. Abdomen: soft, no distention, no tenderness. Bowel sounds positive.   Data Reviewed: I have independently reviewed following labs and imaging studies  CBC Recent Labs  Lab 05/05/24 1743  05/06/24 0416 05/07/24 0420 05/08/24 0435 05/09/24 0349  WBC 6.3 6.8 6.7 6.3 6.7  HGB 10.6* 10.4* 10.9* 11.2* 11.2*  HCT 33.1* 33.1* 34.4* 35.8* 35.5*  PLT 250 208 233 224 229  MCV 89.2 89.7 89.8 91.1 88.1  MCH 28.6 28.2 28.5 28.5 27.8  MCHC 32.0 31.4 31.7 31.3 31.5  RDW 17.8* 17.8* 18.0* 18.2* 18.2*    Recent Labs  Lab 05/05/24 1743 05/06/24 0416 05/07/24 0420 05/08/24 0435 05/09/24 0349  NA 143 144 143 142 141  K  3.6 3.4* 3.9 3.4* 3.8  CL 107 108 108 107 108  CO2 23 27 25 26 26   GLUCOSE 195* 136* 173* 179* 138*  BUN 38* 35* 35* 32* 33*  CREATININE 0.92 0.97 1.00 1.00 0.91  CALCIUM  8.8* 8.7* 8.6* 8.4* 8.6*  AST 35 32 32 34 68*  ALT 32 33 34 37 61*  ALKPHOS 110 108 112 113 133*  BILITOT 0.8 0.8 0.8 0.9 1.2  ALBUMIN  2.7* 2.5* 2.7* 2.6* 2.8*  MG  --   --  1.9  --  1.9    ------------------------------------------------------------------------------------------------------------------ No results for input(s): CHOL, HDL, LDLCALC, TRIG, CHOLHDL, LDLDIRECT in the last 72 hours.  Lab Results  Component Value Date   HGBA1C 8.4 (H) 02/22/2024   ------------------------------------------------------------------------------------------------------------------ No results for input(s): TSH, T4TOTAL, T3FREE, THYROIDAB in the last 72 hours.  Invalid input(s): FREET3  Cardiac Enzymes No results for input(s): CKMB, TROPONINI, MYOGLOBIN in the last 168 hours.  Invalid input(s): CK ------------------------------------------------------------------------------------------------------------------    Component Value Date/Time   BNP 99.2 08/23/2023 1541   BNP 180.9 (H) 07/06/2022 1133   BNP 762 (H) 07/23/2021 0951    CBG: Recent Labs  Lab 05/09/24 0834 05/09/24 1204 05/09/24 1724 05/09/24 2054 05/10/24 0740  GLUCAP 134* 143* 134* 102* 124*    Recent Results (from the past 240 hours)  Body fluid culture w Gram Stain     Status: None (Preliminary result)   Collection Time: 05/08/24  1:50 PM   Specimen: Lung, Left; Pleural Fluid  Result Value Ref Range Status   Specimen Description FLUID  Final   Special Requests NONE  Final   Gram Stain NO WBC SEEN NO ORGANISMS SEEN   Final   Culture   Final    NO GROWTH 2 DAYS Performed at [facility], [address], [place], [place] [address]    Report Status PENDING  Incomplete     Radiology Studies: DG Abd  Portable 1V Result Date: 05/09/2024 CLINICAL DATA:  Unspecified abdominal pain EXAM: PORTABLE ABDOMEN - 1 VIEW COMPARISON:  CT 05/06/2024 FINDINGS: Normal abdominal gas pattern. No gross free intraperitoneal gas. No organomegaly. Right retrocardiac consolidation noted within the visualized lung bases. No acute bone abnormality. IMPRESSION: 1. Normal abdominal gas pattern. 2. Right retrocardiac consolidation. Electronically Signed   By: Worthy Heads M.D.   On: 05/09/2024 20:05     Kathlen Para, MD, PhD [facility]  Between 7 am - 7 pm I am available, please contact me via Amion (for emergencies) or Securechat (non urgent messages)  Between 7 pm - 7 am I am not available, please contact night coverage MD/APP via Amion

## 2024-05-10 NOTE — Consult Note (Signed)
 WOC Nurse wound follow up  WOC team received communication from PT requesting reconsideration of wound care to the sacral wound due to continued purulent drainage and concerns that Aquacel is not being packed into the wound as specified    Wound type: Left hip: unstageable  Sacral: stage 4 L shoulder: unstageable R heel unstageable  L heel DTPI L medial foot: unstageable Measurement: Left hip: see prior note  Sacral: see prior note  L shoulder: see prior note R heel: see prior note L heel: see prior note L medial foot: see prior note Wound bed: Left hip:see prior note Sacrum: 90% clean red moist tissue in wound bed, 10% soft yellow slough at upper edge of wound L shoulder: see prior note R heel: see prior note L heel: see prior note  L medial foot; see prior note Drainage (amount, consistency, odor) Sacral wound with continued purulent drainage  Periwound: See prior note Dressing procedure/placement/frequency: 1 - Left Hip PI unstageable Clean with Vashe ( lawson # [id number]), allow to air dry, apply santyl  to wound bed, cover with saline soaked gauze, and then cover with foam dressing, change daily   2 - Left shoulder unstageable Clean with Vashe ( lawson # [id number]), allow to air dry, apply santyl  to wound bed, cover with saline soaked gauze, and then cover with foam dressing, change daily   3 - Sacral stage 4 PI Cleanse with Vashe#[id number]. Apply Vashe soaked gauze in the wound bed and FILL THE TUNNELING USING A COTTON SWAB, cover with sacral foam dressing, change daily and PRN soilage   R heel:  Clean with Vashe ( lawson # [id number]), allow to air dry, apply santyl  to wound bed, cover with saline soaked gauze, and then cover with foam dressing, change daily   L heel:  Cover with foam dressing, change daily   L medial foot: Clean with Vashe ( lawson # [id number]), allow to air dry, apply santyl  to wound bed, cover with saline soaked gauze, and then cover with foam dressing, change  daily   Both feet should be placed in Prevalon boots to offload pressure Timm Foot [phone])    Change to the plan of care was discussed with daughter via phone call. WOC team will continue to follow patient weekly, please re-consult if new needs arise.  Gillermo Lack, RN, MSN, [facility] WOC Team

## 2024-05-10 NOTE — Progress Notes (Addendum)
 Physical Therapy Wound Treatment Patient Details  Name: Earl [name] MRN: [id number] Date of Birth: [birth year]  Today's Date: 05/10/2024 Time: 4098-1191 Time Calculation (min): 44 min  Subjective  Subjective Assessment Subjective: Pt pleasant and agreeable to wound therapy. Patient and Family Stated Goals: Heal wounds Date of Onset:  (Unknown) Prior Treatments: Dressing changes, off weighting  Pain Score:  Pt premedicated for pain and tolerated treatment without complaints of pain.   Wound Assessment  Pressure Injury 03/06/24 Hip Anterior;Left;Proximal Unstageable - Full thickness tissue loss in which the base of the injury is covered by slough (yellow, tan, gray, green or brown) and/or eschar (tan, brown or black) in the wound bed. unstageable (Active)  Wound Image   05/10/24 1145  Dressing Type Foam - Lift dressing to assess site every shift;Gauze (Comment);Santyl ;Barrier Film (skin prep);Moist to moist; Vashe  05/10/24 1145  Dressing Changed;Clean, Dry, Intact 05/10/24 1145  Dressing Change Frequency Daily 05/10/24 1145  State of Healing Eschar 05/10/24 1145  Site / Wound Assessment Yellow;Pink 05/10/24 1145  % Wound base Red or Granulating 5% 05/10/24 1145  % Wound base Yellow/Fibrinous Exudate 95% 05/10/24 1145  % Wound base Black/Eschar 0% 05/10/24 1145  % Wound base Other/Granulation Tissue (Comment) 0% 05/10/24 1145  Peri-wound Assessment Intact; pink, induration 05/10/24 1145  Wound Length (cm) 4.7 cm 05/09/24 1509  Wound Width (cm) 3.2 cm 05/09/24 1509  Wound Depth (cm) 0.1 cm 05/09/24 1509  Wound Surface Area (cm^2) 15.04 cm^2 05/09/24 1509  Wound Volume (cm^3) 1.5 cm^3 05/09/24 1509  Undermining (cm) Undermining area ~0.4 cm 12:00-1:00 opened up with debridement. 05/10/24 1145  Margins Unattached edges (unapproximated) 05/10/24 1145  Drainage Amount Minimal 05/10/24 1145  Drainage Description Serosanguineous 05/10/24 1145  Treatment Debridement  (Selective);Irrigation; Saline soaked gauze 05/10/24 1145      Selective Debridement (non-excisional) Selective Debridement (non-excisional) - Location: L hip Selective Debridement (non-excisional) - Tools Used: Forceps, Scalpel Selective Debridement (non-excisional) - Tissue Removed: yellow unviable tissue    Wound Assessment and Plan  Wound Therapy - Assess/Plan/Recommendations Wound Therapy - Clinical Statement: Saw for L hip only this session. Progressing with debridement and noted healthy adipose present in wound bed as well. Opened a small area of undermining from 12:00-1:00. This patient will benefit from continued wound therapy of the L hip for selective removal of unviable tissue, to decrease bioburden, and promote wound bed healing. Will continue to follow. Wound Therapy - Functional Problem List: Decreased tolerance for position changes or OOB. Factors Delaying/Impairing Wound Healing: Diabetes Mellitus, Immobility, Infection - systemic/local, Multiple medical problems Hydrotherapy Plan: Debridement, Dressing change, Patient/family education Wound Therapy - Frequency: 2X / week Wound Therapy - Follow Up Recommendations: dressing changes by RN  Wound Therapy Goals- Improve the function of patient's integumentary system by progressing the wound(s) through the phases of wound healing (inflammation - proliferation - remodeling) by: Wound Therapy Goals - Improve the function of patient's integumentary system by progressing the wound(s) through the phases of wound healing by: Decrease Necrotic Tissue to: 20% Decrease Necrotic Tissue - Progress: Progressing toward goal Increase Granulation Tissue to: 80% Increase Granulation Tissue - Progress: Progressing toward goal Goals/treatment plan/discharge plan were made with and agreed upon by patient/family: Yes Time For Goal Achievement: 7 days Wound Therapy - Potential for Goals: Good  Goals will be updated until maximal potential  achieved or discharge criteria met.  Discharge criteria: when goals achieved, discharge from hospital, MD decision/surgical intervention, no progress towards goals, refusal/missing three consecutive treatments without notification or medical  reason.  GP     Charges PT Wound Care Charges $Wound Debridement up to 20 cm: < or equal to 20 cm $PT Hydrotherapy Dressing: 2 dressings $PT Hydrotherapy Visit: 1 Visit       [name] 05/10/2024, 11:54 AM  Simone Dubois, PT, DPT Acute Rehabilitation Services Secure Chat Preferred Office: [phone]

## 2024-05-11 DIAGNOSIS — A419 Sepsis, unspecified organism: Secondary | ICD-10-CM | POA: Diagnosis not present

## 2024-05-11 DIAGNOSIS — R6520 Severe sepsis without septic shock: Secondary | ICD-10-CM | POA: Diagnosis not present

## 2024-05-11 LAB — COMPREHENSIVE METABOLIC PANEL WITH GFR
ALT: 62 U/L — ABNORMAL HIGH (ref 0–44)
AST: 54 U/L — ABNORMAL HIGH (ref 15–41)
Albumin: 2.6 g/dL — ABNORMAL LOW (ref 3.5–5.0)
Alkaline Phosphatase: 133 U/L — ABNORMAL HIGH (ref 38–126)
Anion gap: 8 (ref 5–15)
BUN: 38 mg/dL — ABNORMAL HIGH (ref 8–23)
CO2: 26 mmol/L (ref 22–32)
Calcium: 8.6 mg/dL — ABNORMAL LOW (ref 8.9–10.3)
Chloride: 109 mmol/L (ref 98–111)
Creatinine, Ser: 0.95 mg/dL (ref 0.61–1.24)
GFR, Estimated: >60 mL/min (ref >60)
Glucose, Bld: 69 mg/dL — ABNORMAL LOW (ref 70–99)
Potassium: 3.3 mmol/L — ABNORMAL LOW (ref 3.5–5.1)
Sodium: 143 mmol/L (ref 135–145)
Total Bilirubin: 0.9 mg/dL (ref 0.0–1.2)
Total Protein: 5.7 g/dL — ABNORMAL LOW (ref 6.5–8.1)

## 2024-05-11 LAB — BODY FLUID CULTURE W GRAM STAIN
Culture: NO GROWTH
Gram Stain: NONE SEEN

## 2024-05-11 LAB — CBC
HCT: 33.9 % — ABNORMAL LOW (ref 39.0–52.0)
Hemoglobin: 11 g/dL — ABNORMAL LOW (ref 13.0–17.0)
MCH: 28.6 pg (ref 26.0–34.0)
MCHC: 32.4 g/dL (ref 30.0–36.0)
MCV: 88.1 fL (ref 80.0–100.0)
Platelets: 215 10*3/uL (ref 150–400)
RBC: 3.85 MIL/uL — ABNORMAL LOW (ref 4.22–5.81)
RDW: 18.3 % — ABNORMAL HIGH (ref 11.5–15.5)
WBC: 5 10*3/uL (ref 4.0–10.5)
nRBC: 0 % (ref 0.0–0.2)

## 2024-05-11 LAB — GLUCOSE, CAPILLARY
Glucose-Capillary: 103 mg/dL — ABNORMAL HIGH (ref 70–99)
Glucose-Capillary: 185 mg/dL — ABNORMAL HIGH (ref 70–99)
Glucose-Capillary: 195 mg/dL — ABNORMAL HIGH (ref 70–99)
Glucose-Capillary: 252 mg/dL — ABNORMAL HIGH (ref 70–99)

## 2024-05-11 LAB — MAGNESIUM: Magnesium: 1.8 mg/dL (ref 1.7–2.4)

## 2024-05-11 MED ORDER — POTASSIUM CHLORIDE CRYS ER 20 MEQ PO TBCR
40.0000 meq | EXTENDED_RELEASE_TABLET | Freq: Once | ORAL | Status: AC
  Administered 2024-05-11: 40 meq via ORAL
  Filled 2024-05-11: qty 2

## 2024-05-11 MED ORDER — ALBUMIN HUMAN 25 % IV SOLN
25.0000 g | Freq: Four times a day (QID) | INTRAVENOUS | Status: AC
  Administered 2024-05-11 (×2): 25 g via INTRAVENOUS
  Filled 2024-05-11 (×2): qty 100

## 2024-05-11 NOTE — Progress Notes (Signed)
 PROGRESS NOTE  Earl [name] FMW:[id number] DOB: 06-Sep-1941 DOA: 03/04/2024 PCP: [name], MD   LOS: 68 days   Brief Narrative / Interim history: 83 year old male with history of PAF on Eliquis , IDDM 2, HTN, CVA with right-sided residual weakness, MGUS, BPH, systolic CHF who is here with sacral pain.  Of note, he was hospitalized 3/26-4/10 with generalized weakness/acute encephalopathy, hypotension, and was COVID PCR positive.  He was eventually discharged to SNF and readmitted 4 days later with pain in the sacral area and shortness of breath.  He was found to be in A-fib with RVR, tachypneic and was hypotensive on admission for which critical care was consulted.  General surgery was consulted for sacral wound debridement status post bedside excision 4/15 and 03/07/2024.  Subjective / 24h Interval events: Awake today, main concern is his generalized weakness.  He denies any abdominal discomfort this morning, he denies any shortness of breath.  He denies any nausea or vomiting.  No chest pain.  Assesement and Plan: Principal problem Severe sepsis-on admission, patient had lactic acidosis, white count, fever secondary to infected chronic wound.  He has received multiple antibiotics during his long hospitalization.  Sepsis physiology has now resolved and he is currently afebrile, normotensive, not tachycardic.  White count within normal limits at 5.0  Active problems Stage IV sacral decubitus wound/ulcer-repeat CT scan on 6/16 with some cortical irregularity along the underlying dorsal mid sacrum, concerning for osteomyelitis, findings appearing to be new.  Infectious disease MD reconsulted and Dr. Dea reviewed the case, did not recommend antibiotics but wound care alone, with plans for antibiotics if and when signs of infection - Dr. Waddell with plastic surgery evaluated patient 6/17, appears that he has a clean sacral decubitus, not a candidate for any type of flap closure.   Per his note, patient nor daughter were interested in surgery at this time.  Plastic surgery also recommends wound care - He most recently finished Fortaz  on 6/13, most recent wound culture done on 6/4 showed Pseudomonas and Klebsiella - Wound care reevaluated, discussed with PT, have been using Aquacel but it appears that on most recent dressing change it appears that it was not covering certain areas, discussed with wound care and change to Vashe, will reevaluate next week  Acute on chronic pain-secondary to above, continue pain regimen with oxycodone  as below  Acute on chronic combined CHF, cardiac amyloidosis, nonischemic cardiomyopathy -on tafamidis  and vutrisiran  for amyloid.  Goal-directed medical therapy somewhat limited by prior episodes of hypotension.  Currently there is evidence that he is having anasarca/fluid overload, has been placed on furosemide , continue.  Still has fluid overload in the abdomen and upper thighs.  Renal function is stable and blood pressure is tolerating diuresis.  Will administer albumin  again x 2 today  PAF-continue amiodarone  and Eliquis   Anemia of chronic disease-received a unit of packed red blood cells on 4/30.  Hemoglobin has remained stable, 11.0 today, no evidence of bleeding  Dysphagia/severe protein calorie malnutrition-continue supplementation, pured diet  Physical debilitation, muscle weakness-PT/OT ongoing  Goals of care -palliative consulted as well, appears to be DNR.  Daughter wants him to transition at home once his wounds are better  IDDM -continue Semglee , sliding scale, Accu-Cheks  CBG (last 3)  Recent Labs    05/10/24 1703 05/10/24 2115 05/11/24 0737  GLUCAP 263* 147* 103*   Lab Results  Component Value Date   HGBA1C 8.4 (H) 02/22/2024   Scheduled Meds:  acetaminophen   500 mg Oral TID  amiodarone   200 mg Oral Daily   apixaban   5 mg Oral BID   atorvastatin   40 mg Oral Daily   Chlorhexidine  Gluconate Cloth  6 each Topical  Daily   collagenase    Topical Daily   diclofenac  Sodium  4 g Topical QID   feeding supplement  237 mL Oral BID BM   ferrous sulfate   325 mg Oral QHS   furosemide   40 mg Intravenous BID   guaiFENesin   600 mg Oral BID   insulin  aspart  0-5 Units Subcutaneous QHS   insulin  aspart  0-9 Units Subcutaneous TID WC   insulin  glargine-yfgn  8 Units Subcutaneous Daily   levothyroxine   50 mcg Oral Q0600   liver oil-zinc  oxide   Topical BID   multivitamin with minerals  1 tablet Oral Daily   nutrition supplement (JUVEN)  1 packet Oral BID BM   mouth rinse  15 mL Mouth Rinse 4 times per day   oxyCODONE   5 mg Oral QHS   phosphorus  500 mg Oral TID   polyethylene glycol  17 g Oral BID   potassium chloride   40 mEq Oral Once   senna-docusate  1 tablet Oral QHS   sertraline   25 mg Oral Daily   sodium chloride  flush  10-40 mL Intracatheter Q12H   sodium chloride  flush  3 mL Intravenous Q12H   Tafamidis   1 capsule Oral Daily   vutrisiran  sodium  25 mg Subcutaneous Q90 days   Continuous Infusions:  albumin  human 25 g (05/11/24 1001)   dextrose  Stopped (05/01/24 0003)   PRN Meds:.acetaminophen  **OR** acetaminophen , food thickener, hydrOXYzine , lip balm, ondansetron  **OR** ondansetron  (ZOFRAN ) IV, mouth rinse, oxyCODONE , polyethylene glycol, artificial tears, sodium chloride  flush, sodium chloride  flush  Current Outpatient Medications  Medication Instructions   Amino Acids -Protein Hydrolys (PRO-STAT AWC) LIQD 30 mLs, Oral, 2 times daily   AMVUTTRA  25 MG/0.5ML syringe INJECT 25MG  (0.5ML) SUBCUTANEOUSLY ONCE EVERY 3 MONTHS. ROTATE ADMINISTRATION SITE WITH EACH INJECTION. *REFRIGERATE. DO NOT SHAKE.*   ascorbic acid  (VITAMIN C ) 500 mg, Oral, Daily,     atorvastatin  (LIPITOR) 40 MG tablet Take  1 tablet  Daily for Cholesterol                                                       /                                  TAKE                                                       BY                                            MOUTH   B-12 50 mcg, Oral, Daily   Blood Glucose Monitoring Suppl (ONE TOUCH ULTRA 2) w/Device KIT Check blood sugar 1 time a day-DX-E11.9   Cholecalciferol  (VITAMIN D  PO) 1 tablet, Oral, Daily   ELIQUIS  5 MG TABS tablet  TAKE 1 TABLET BY MOUTH TWICE  DAILY TO PREVENT BLOOD CLOTS   Emollient (AQUAPHOR ADV PROTECT HEALING) OINT Apply small amount to two areas on buttocks daily to prevent breakdown   ferrous sulfate  325 mg, Oral, Daily at bedtime   finasteride  (PROSCAR ) 5 MG tablet TAKE 1 TABLET BY MOUTH DAILY FOR PROSTATE   glucose blood (ONETOUCH ULTRA) test strip Use as instructed   Lancets (ONETOUCH DELICA PLUS LANCET30G) MISC CHECK BLOOD SUGAR ONCE A DAY.   Lancets Misc. (ONE TOUCH SURESOFT) MISC Check blood sugar 1 time a day-DX-E11.91   levothyroxine  (SYNTHROID ) 50 MCG tablet TAKE 1 TABLET BY MOUTH DAILY ON  AN EMPTY STOMACH WITH ONLY WATER FOR 30 MINUTES AND NO ANTACID,  MEDS, CALCIUM  OR MAGNESIUM  FOR 4 HOURS AND AVOID BIOTIN   Magnesium  250 mg, Oral, Daily   metFORMIN  (GLUCOPHAGE -XR) 500 MG 24 hr tablet TAKE 2 TABLETS BY MOUTH TWICE  DAILY WITH MEALS FOR DIABETES   metoprolol  succinate (TOPROL -XL) 25 mg, Oral, Daily at bedtime   Multiple Vitamin (MULTIVITAMIN) capsule 1 capsule, Oral, Daily,     NONFORMULARY OR COMPOUNDED ITEM Please dispense a Rolling Walker.  Dx:R26.81 & G63   Nutritional Supplements (BOOST GLUCOSE CONTROL) LIQD 237 mLs, Oral, 2 times daily after meals   potassium chloride  SA (KLOR-CON  M20) 20 MEQ tablet 20 mEq, Oral, Daily   sacubitril -valsartan  (ENTRESTO ) 24-26 MG 1 tablet, Oral, 2 times daily   Tafamidis  (VYNDAMAX ) 61 MG CAPS Take 1 capsule by mouth daily.    Diet Orders (From admission, onward)     Start     Ordered   04/30/24 1436  DIET - DYS 1 Room service appropriate? No; Fluid consistency: Thin  Diet effective now       Question Answer Comment  Room service appropriate? No   Fluid consistency: Thin      04/30/24 1436            DVT  prophylaxis: SCDs Start: 03/04/24 2151 Place TED hose Start: 03/04/24 2151 apixaban  (ELIQUIS ) tablet 5 mg   Lab Results  Component Value Date   PLT 215 05/11/2024      Code Status: Limited: Do not attempt resuscitation (DNR) -DNR-LIMITED -Do Not Intubate/DNI   Family Communication: No family at bedside  Status is: Inpatient Remains inpatient appropriate because: severity of illness  Level of care: Telemetry Medical  Consultants:  Palliative ID Surgery   Objective: Vitals:   05/10/24 1732 05/10/24 2113 05/10/24 2325 05/11/24 0400  BP: 137/74 134/73 107/62 107/66  Pulse: 76 79 61 73  Resp: 18 18 19 18   Temp: (!) 97.4 F (36.3 C) (!) 97.5 F (36.4 C) 97.6 F (36.4 C) 97.8 F (36.6 C)  TempSrc:      SpO2: 93% 100% 100% 94%  Weight:      Height:        Intake/Output Summary (Last 24 hours) at 05/11/2024 1104 Last data filed at 05/11/2024 0403 Gross per 24 hour  Intake --  Output 1250 ml  Net -1250 ml   Wt Readings from Last 3 Encounters:  05/09/24 79.8 kg  02/14/24 72.1 kg  01/24/24 72.1 kg    Examination:  Constitutional: NAD Eyes: lids and conjunctivae normal, no scleral icterus ENMT: mmm Neck: normal, supple Respiratory: clear to auscultation bilaterally, no wheezing, no crackles. Normal respiratory effort.  Cardiovascular: Regular rate and rhythm, no murmurs / rubs / gallops. No LE edema. Abdomen: soft, no distention, no tenderness. Bowel sounds positive.   Data Reviewed: I have  independently reviewed following labs and imaging studies  CBC Recent Labs  Lab 05/06/24 0416 05/07/24 0420 05/08/24 0435 05/09/24 0349 05/11/24 0606  WBC 6.8 6.7 6.3 6.7 5.0  HGB 10.4* 10.9* 11.2* 11.2* 11.0*  HCT 33.1* 34.4* 35.8* 35.5* 33.9*  PLT 208 233 224 229 215  MCV 89.7 89.8 91.1 88.1 88.1  MCH 28.2 28.5 28.5 27.8 28.6  MCHC 31.4 31.7 31.3 31.5 32.4  RDW 17.8* 18.0* 18.2* 18.2* 18.3*    Recent Labs  Lab 05/06/24 0416 05/07/24 0420 05/08/24 0435  05/09/24 0349 05/11/24 0606  NA 144 143 142 141 143  K 3.4* 3.9 3.4* 3.8 3.3*  CL 108 108 107 108 109  CO2 27 25 26 26 26   GLUCOSE 136* 173* 179* 138* 69*  BUN 35* 35* 32* 33* 38*  CREATININE 0.97 1.00 1.00 0.91 0.95  CALCIUM  8.7* 8.6* 8.4* 8.6* 8.6*  AST 32 32 34 68* 54*  ALT 33 34 37 61* 62*  ALKPHOS 108 112 113 133* 133*  BILITOT 0.8 0.8 0.9 1.2 0.9  ALBUMIN  2.5* 2.7* 2.6* 2.8* 2.6*  MG  --  1.9  --  1.9 1.8    ------------------------------------------------------------------------------------------------------------------ No results for input(s): CHOL, HDL, LDLCALC, TRIG, CHOLHDL, LDLDIRECT in the last 72 hours.  Lab Results  Component Value Date   HGBA1C 8.4 (H) 02/22/2024   ------------------------------------------------------------------------------------------------------------------ No results for input(s): TSH, T4TOTAL, T3FREE, THYROIDAB in the last 72 hours.  Invalid input(s): FREET3  Cardiac Enzymes No results for input(s): CKMB, TROPONINI, MYOGLOBIN in the last 168 hours.  Invalid input(s): CK ------------------------------------------------------------------------------------------------------------------    Component Value Date/Time   BNP 99.2 08/23/2023 1541   BNP 180.9 (H) 07/06/2022 1133   BNP 762 (H) 07/23/2021 0951    CBG: Recent Labs  Lab 05/10/24 0740 05/10/24 1216 05/10/24 1703 05/10/24 2115 05/11/24 0737  GLUCAP 124* 257* 263* 147* 103*    Recent Results (from the past 240 hours)  Body fluid culture w Gram Stain     Status: None (Preliminary result)   Collection Time: 05/08/24  1:50 PM   Specimen: Lung, Left; Pleural Fluid  Result Value Ref Range Status   Specimen Description FLUID  Final   Special Requests NONE  Final   Gram Stain NO WBC SEEN NO ORGANISMS SEEN   Final   Culture   Final    NO GROWTH 2 DAYS Performed at [facility], [address] [place], [place] [address]    Report  Status PENDING  Incomplete     Radiology Studies: No results found.    Nilda Fendt, MD, PhD [facility]  Between 7 am - 7 pm I am available, please contact me via Amion (for emergencies) or Securechat (non urgent messages)  Between 7 pm - 7 am I am not available, please contact night coverage MD/APP via Amion

## 2024-05-11 NOTE — Plan of Care (Signed)
  Problem: Fluid Volume: Goal: Hemodynamic stability will improve Outcome: Progressing   Problem: Clinical Measurements: Goal: Diagnostic test results will improve Outcome: Progressing Goal: Signs and symptoms of infection will decrease Outcome: Progressing   Problem: Respiratory: Goal: Ability to maintain adequate ventilation will improve Outcome: Progressing   Problem: Education: Goal: Knowledge of risk factors and measures for prevention of condition will improve Outcome: Progressing   Problem: Coping: Goal: Psychosocial and spiritual needs will be supported Outcome: Progressing   Problem: Respiratory: Goal: Will maintain a patent airway Outcome: Progressing Goal: Complications related to the disease process, condition or treatment will be avoided or minimized Outcome: Progressing   Problem: Education: Goal: Ability to describe self-care measures that may prevent or decrease complications (Diabetes Survival Skills Education) will improve Outcome: Progressing   Problem: Coping: Goal: Ability to adjust to condition or change in health will improve Outcome: Progressing   Problem: Fluid Volume: Goal: Ability to maintain a balanced intake and output will improve Outcome: Progressing   Problem: Health Behavior/Discharge Planning: Goal: Ability to manage health-related needs will improve Outcome: Progressing   Problem: Metabolic: Goal: Ability to maintain appropriate glucose levels will improve Outcome: Progressing   Problem: Nutritional: Goal: Maintenance of adequate nutrition will improve Outcome: Progressing Goal: Progress toward achieving an optimal weight will improve Outcome: Progressing   Problem: Skin Integrity: Goal: Risk for impaired skin integrity will decrease Outcome: Progressing   Problem: Tissue Perfusion: Goal: Adequacy of tissue perfusion will improve Outcome: Progressing   Problem: Education: Goal: Knowledge of General Education information  will improve Description: Including pain rating scale, medication(s)/side effects and non-pharmacologic comfort measures Outcome: Progressing   Problem: Health Behavior/Discharge Planning: Goal: Ability to manage health-related needs will improve Outcome: Progressing   Problem: Clinical Measurements: Goal: Ability to maintain clinical measurements within normal limits will improve Outcome: Progressing Goal: Will remain free from infection Outcome: Progressing Goal: Diagnostic test results will improve Outcome: Progressing Goal: Respiratory complications will improve Outcome: Progressing Goal: Cardiovascular complication will be avoided Outcome: Progressing   Problem: Activity: Goal: Risk for activity intolerance will decrease Outcome: Progressing   Problem: Nutrition: Goal: Adequate nutrition will be maintained Outcome: Progressing   Problem: Coping: Goal: Level of anxiety will decrease Outcome: Progressing   Problem: Elimination: Goal: Will not experience complications related to bowel motility Outcome: Progressing Goal: Will not experience complications related to urinary retention Outcome: Progressing   Problem: Pain Managment: Goal: General experience of comfort will improve and/or be controlled Outcome: Progressing   Problem: Safety: Goal: Ability to remain free from injury will improve Outcome: Progressing   Problem: Skin Integrity: Goal: Risk for impaired skin integrity will decrease Outcome: Progressing

## 2024-05-12 ENCOUNTER — Encounter (HOSPITAL_COMMUNITY): Payer: Self-pay | Admitting: Internal Medicine

## 2024-05-12 DIAGNOSIS — R6520 Severe sepsis without septic shock: Secondary | ICD-10-CM | POA: Diagnosis not present

## 2024-05-12 DIAGNOSIS — A419 Sepsis, unspecified organism: Secondary | ICD-10-CM | POA: Diagnosis not present

## 2024-05-12 LAB — GLUCOSE, CAPILLARY
Glucose-Capillary: 136 mg/dL — ABNORMAL HIGH (ref 70–99)
Glucose-Capillary: 167 mg/dL — ABNORMAL HIGH (ref 70–99)
Glucose-Capillary: 195 mg/dL — ABNORMAL HIGH (ref 70–99)
Glucose-Capillary: 166 mg/dL — ABNORMAL HIGH (ref 70–99)

## 2024-05-12 MED ORDER — FUROSEMIDE 10 MG/ML IJ SOLN
40.0000 mg | Freq: Two times a day (BID) | INTRAMUSCULAR | Status: DC
  Administered 2024-05-12 – 2024-05-13 (×3): 40 mg via INTRAVENOUS
  Filled 2024-05-12 (×3): qty 4

## 2024-05-12 NOTE — Plan of Care (Signed)
  Problem: Fluid Volume: Goal: Hemodynamic stability will improve Outcome: Progressing   Problem: Nutrition: Goal: Adequate nutrition will be maintained Outcome: Progressing   Problem: Skin Integrity: Goal: Risk for impaired skin integrity will decrease Outcome: Progressing   Dressings changed during shift- D/C/I

## 2024-05-12 NOTE — Progress Notes (Signed)
 PROGRESS NOTE  Earl [name] FMW:[id number] DOB: [birth year] DOA: 03/04/2024 PCP: [name], MD   LOS: 69 days   Brief Narrative / Interim history: 83 year old male with history of PAF on Eliquis , IDDM 2, HTN, CVA with right-sided residual weakness, MGUS, BPH, systolic CHF who is here with sacral pain.  Of note, he was hospitalized 3/26-4/10 with generalized weakness/acute encephalopathy, hypotension, and was COVID PCR positive.  He was eventually discharged to SNF and readmitted 4 days later with pain in the sacral area and shortness of breath.  He was found to be in A-fib with RVR, tachypneic and was hypotensive on admission for which critical care was consulted.  General surgery was consulted for sacral wound debridement status post bedside excision 4/15 and 03/07/2024.  Subjective / 24h Interval events: Continues to complain of generalized weakness.  No nausea or vomiting.  Daughter is at bedside and reports that he does not want to eat breakfast  Assesement and Plan: Principal problem Severe sepsis-on admission, patient had lactic acidosis, white count, fever secondary to infected chronic wound.  He has received multiple antibiotics during his long hospitalization.  Sepsis physiology has now resolved and he is currently afebrile, normotensive, not tachycardic.  White count stable, today's CBC pending  Active problems Stage IV sacral decubitus wound/ulcer-repeat CT scan on 6/16 with some cortical irregularity along the underlying dorsal mid sacrum, concerning for osteomyelitis, findings appearing to be new.  Infectious disease MD reconsulted and Dr. Dea reviewed the case, did not recommend antibiotics but wound care alone, with plans for antibiotics if and when signs of infection - Dr. Waddell with plastic surgery evaluated patient 6/17, appears that he has a clean sacral decubitus, not a candidate for any type of flap closure.  Per his note, patient nor daughter were  interested in surgery at this time.  Plastic surgery also recommends wound care - He most recently finished Fortaz  on 6/13, most recent wound culture done on 6/4 showed Pseudomonas and Klebsiella - Wound care reevaluated, discussed with PT, have been using Aquacel but it appears that on most recent dressing change it appears that it was not covering certain areas, discussed with wound care and change to Vashe, reevaluate next week  Acute on chronic pain-secondary to above, continue pain regimen with oxycodone  as below  Acute on chronic combined CHF, cardiac amyloidosis, nonischemic cardiomyopathy -on tafamidis  and vutrisiran  for amyloid.  Goal-directed medical therapy somewhat limited by prior episodes of hypotension.  Currently there is evidence that he is having anasarca/fluid overload, has been placed on furosemide , continue.  Still has fluid overload in the abdomen and upper thighs.  Renal function is stable and blood pressure is tolerating diuresis.  Continue Lasix .  Renal function this morning pending  PAF-continue amiodarone  and Eliquis   Anemia of chronic disease-received a unit of packed red blood cells on 4/30.  Hemoglobin has remained stable, no bleeding  Dysphagia/severe protein calorie malnutrition-continue supplementation, pured diet  Physical debilitation, muscle weakness-PT/OT ongoing  Goals of care -palliative consulted as well, appears to be DNR.  Daughter wants him to transition at home once his wounds are better  IDDM -continue Semglee , sliding scale, Accu-Cheks  CBG (last 3)  Recent Labs    05/11/24 1605 05/11/24 2048 05/12/24 0823  GLUCAP 195* 252* 136*   Lab Results  Component Value Date   HGBA1C 8.4 (H) 02/22/2024   Scheduled Meds:  acetaminophen   500 mg Oral TID   amiodarone   200 mg Oral Daily   apixaban   5 mg  Oral BID   atorvastatin   40 mg Oral Daily   Chlorhexidine  Gluconate Cloth  6 each Topical Daily   collagenase    Topical Daily   diclofenac  Sodium   4 g Topical QID   feeding supplement  237 mL Oral BID BM   ferrous sulfate   325 mg Oral QHS   furosemide   40 mg Intravenous BID   guaiFENesin   600 mg Oral BID   insulin  aspart  0-5 Units Subcutaneous QHS   insulin  aspart  0-9 Units Subcutaneous TID WC   insulin  glargine-yfgn  8 Units Subcutaneous Daily   levothyroxine   50 mcg Oral Q0600   liver oil-zinc  oxide   Topical BID   multivitamin with minerals  1 tablet Oral Daily   nutrition supplement (JUVEN)  1 packet Oral BID BM   mouth rinse  15 mL Mouth Rinse 4 times per day   oxyCODONE   5 mg Oral QHS   phosphorus  500 mg Oral TID   polyethylene glycol  17 g Oral BID   senna-docusate  1 tablet Oral QHS   sertraline   25 mg Oral Daily   sodium chloride  flush  10-40 mL Intracatheter Q12H   Tafamidis   1 capsule Oral Daily   vutrisiran  sodium  25 mg Subcutaneous Q90 days   Continuous Infusions:  dextrose  Stopped (05/01/24 0003)   PRN Meds:.acetaminophen  **OR** acetaminophen , food thickener, hydrOXYzine , lip balm, ondansetron  **OR** ondansetron  (ZOFRAN ) IV, mouth rinse, oxyCODONE , polyethylene glycol, artificial tears  Current Outpatient Medications  Medication Instructions   Amino Acids -Protein Hydrolys (PRO-STAT AWC) LIQD 30 mLs, Oral, 2 times daily   AMVUTTRA  25 MG/0.5ML syringe INJECT 25MG  (0.5ML) SUBCUTANEOUSLY ONCE EVERY 3 MONTHS. ROTATE ADMINISTRATION SITE WITH EACH INJECTION. *REFRIGERATE. DO NOT SHAKE.*   ascorbic acid  (VITAMIN C ) 500 mg, Oral, Daily,     atorvastatin  (LIPITOR) 40 MG tablet Take  1 tablet  Daily for Cholesterol                                                       /                                  TAKE                                                       BY                                           MOUTH   B-12 50 mcg, Oral, Daily   Blood Glucose Monitoring Suppl (ONE TOUCH ULTRA 2) w/Device KIT Check blood sugar 1 time a day-DX-E11.9   Cholecalciferol  (VITAMIN D  PO) 1 tablet, Oral, Daily   ELIQUIS  5 MG TABS  tablet TAKE 1 TABLET BY MOUTH TWICE  DAILY TO PREVENT BLOOD CLOTS   Emollient (AQUAPHOR ADV PROTECT HEALING) OINT Apply small amount to two areas on buttocks daily to prevent breakdown   ferrous sulfate  325 mg, Oral, Daily at bedtime   finasteride  (  PROSCAR ) 5 MG tablet TAKE 1 TABLET BY MOUTH DAILY FOR PROSTATE   glucose blood (ONETOUCH ULTRA) test strip Use as instructed   Lancets (ONETOUCH DELICA PLUS LANCET30G) MISC CHECK BLOOD SUGAR ONCE A DAY.   Lancets Misc. (ONE TOUCH SURESOFT) MISC Check blood sugar 1 time a day-DX-E11.91   levothyroxine  (SYNTHROID ) 50 MCG tablet TAKE 1 TABLET BY MOUTH DAILY ON  AN EMPTY STOMACH WITH ONLY WATER FOR 30 MINUTES AND NO ANTACID,  MEDS, CALCIUM  OR MAGNESIUM  FOR 4 HOURS AND AVOID BIOTIN   Magnesium  250 mg, Oral, Daily   metFORMIN  (GLUCOPHAGE -XR) 500 MG 24 hr tablet TAKE 2 TABLETS BY MOUTH TWICE  DAILY WITH MEALS FOR DIABETES   metoprolol  succinate (TOPROL -XL) 25 mg, Oral, Daily at bedtime   Multiple Vitamin (MULTIVITAMIN) capsule 1 capsule, Oral, Daily,     NONFORMULARY OR COMPOUNDED ITEM Please dispense a Rolling Walker.  Dx:R26.81 & G63   Nutritional Supplements (BOOST GLUCOSE CONTROL) LIQD 237 mLs, Oral, 2 times daily after meals   potassium chloride  SA (KLOR-CON  M20) 20 MEQ tablet 20 mEq, Oral, Daily   sacubitril -valsartan  (ENTRESTO ) 24-26 MG 1 tablet, Oral, 2 times daily   Tafamidis  (VYNDAMAX ) 61 MG CAPS Take 1 capsule by mouth daily.    Diet Orders (From admission, onward)     Start     Ordered   04/30/24 1436  DIET - DYS 1 Room service appropriate? No; Fluid consistency: Thin  Diet effective now       Question Answer Comment  Room service appropriate? No   Fluid consistency: Thin      04/30/24 1436            DVT prophylaxis: SCDs Start: 03/04/24 2151 Place TED hose Start: 03/04/24 2151 apixaban  (ELIQUIS ) tablet 5 mg   Lab Results  Component Value Date   PLT 215 05/11/2024      Code Status: Limited: Do not attempt resuscitation  (DNR) -DNR-LIMITED -Do Not Intubate/DNI   Family Communication: No family at bedside  Status is: Inpatient Remains inpatient appropriate because: severity of illness  Level of care: Telemetry Medical  Consultants:  Palliative ID Surgery   Objective: Vitals:   05/12/24 0013 05/12/24 0045 05/12/24 0449 05/12/24 0828  BP: 126/87 126/87 137/76 (!) 140/81  Pulse: 80 80 62 72  Resp: 18 18 18 18   Temp: 97.7 F (36.5 C) 97.7 F (36.5 C) (!) 97.4 F (36.3 C) 97.7 F (36.5 C)  TempSrc:  Oral    SpO2: 100% 100% 96% 100%  Weight:      Height:        Intake/Output Summary (Last 24 hours) at 05/12/2024 1007 Last data filed at 05/12/2024 0450 Gross per 24 hour  Intake 390.73 ml  Output 1850 ml  Net -1459.27 ml   Wt Readings from Last 3 Encounters:  05/09/24 79.8 kg  02/14/24 72.1 kg  01/24/24 72.1 kg    Examination:  Constitutional: NAD Eyes: lids and conjunctivae normal, no scleral icterus ENMT: mmm Neck: normal, supple Respiratory: clear to auscultation bilaterally, no wheezing, no crackles. Normal respiratory effort.  Cardiovascular: Regular rate and rhythm, no murmurs / rubs / gallops. No LE edema. Abdomen: soft, no distention, no tenderness. Bowel sounds positive.   Data Reviewed: I have independently reviewed following labs and imaging studies  CBC Recent Labs  Lab 05/06/24 0416 05/07/24 0420 05/08/24 0435 05/09/24 0349 05/11/24 0606  WBC 6.8 6.7 6.3 6.7 5.0  HGB 10.4* 10.9* 11.2* 11.2* 11.0*  HCT 33.1* 34.4* 35.8* 35.5* 33.9*  PLT 208 233 224 229 215  MCV 89.7 89.8 91.1 88.1 88.1  MCH 28.2 28.5 28.5 27.8 28.6  MCHC 31.4 31.7 31.3 31.5 32.4  RDW 17.8* 18.0* 18.2* 18.2* 18.3*    Recent Labs  Lab 05/06/24 0416 05/07/24 0420 05/08/24 0435 05/09/24 0349 05/11/24 0606  NA 144 143 142 141 143  K 3.4* 3.9 3.4* 3.8 3.3*  CL 108 108 107 108 109  CO2 27 25 26 26 26   GLUCOSE 136* 173* 179* 138* 69*  BUN 35* 35* 32* 33* 38*  CREATININE 0.97 1.00 1.00  0.91 0.95  CALCIUM  8.7* 8.6* 8.4* 8.6* 8.6*  AST 32 32 34 68* 54*  ALT 33 34 37 61* 62*  ALKPHOS 108 112 113 133* 133*  BILITOT 0.8 0.8 0.9 1.2 0.9  ALBUMIN  2.5* 2.7* 2.6* 2.8* 2.6*  MG  --  1.9  --  1.9 1.8    ------------------------------------------------------------------------------------------------------------------ No results for input(s): CHOL, HDL, LDLCALC, TRIG, CHOLHDL, LDLDIRECT in the last 72 hours.  Lab Results  Component Value Date   HGBA1C 8.4 (H) 02/22/2024   ------------------------------------------------------------------------------------------------------------------ No results for input(s): TSH, T4TOTAL, T3FREE, THYROIDAB in the last 72 hours.  Invalid input(s): FREET3  Cardiac Enzymes No results for input(s): CKMB, TROPONINI, MYOGLOBIN in the last 168 hours.  Invalid input(s): CK ------------------------------------------------------------------------------------------------------------------    Component Value Date/Time   BNP 99.2 08/23/2023 1541   BNP 180.9 (H) 07/06/2022 1133   BNP 762 (H) 07/23/2021 0951    CBG: Recent Labs  Lab 05/11/24 0737 05/11/24 1226 05/11/24 1605 05/11/24 2048 05/12/24 0823  GLUCAP 103* 185* 195* 252* 136*    Recent Results (from the past 240 hours)  Body fluid culture w Gram Stain     Status: None   Collection Time: 05/08/24  1:50 PM   Specimen: Lung, Left; Pleural Fluid  Result Value Ref Range Status   Specimen Description FLUID  Final   Special Requests NONE  Final   Gram Stain NO WBC SEEN NO ORGANISMS SEEN   Final   Culture   Final    NO GROWTH 3 DAYS Performed at [facility], [address], [place], [place] [address]    Report Status 05/11/2024 FINAL  Final     Radiology Studies: No results found.    Nilda Fendt, MD, PhD [facility]  Between 7 am - 7 pm I am available, please contact me via Amion (for emergencies) or Securechat (non urgent  messages)  Between 7 pm - 7 am I am not available, please contact night coverage MD/APP via Amion

## 2024-05-13 ENCOUNTER — Other Ambulatory Visit: Payer: Self-pay

## 2024-05-13 DIAGNOSIS — R6520 Severe sepsis without septic shock: Secondary | ICD-10-CM | POA: Diagnosis not present

## 2024-05-13 DIAGNOSIS — A419 Sepsis, unspecified organism: Secondary | ICD-10-CM | POA: Diagnosis not present

## 2024-05-13 LAB — CBC
HCT: 35.8 % — ABNORMAL LOW (ref 39.0–52.0)
Hemoglobin: 11.5 g/dL — ABNORMAL LOW (ref 13.0–17.0)
MCH: 28.5 pg (ref 26.0–34.0)
MCHC: 32.1 g/dL (ref 30.0–36.0)
MCV: 88.8 fL (ref 80.0–100.0)
Platelets: 244 10*3/uL (ref 150–400)
RBC: 4.03 MIL/uL — ABNORMAL LOW (ref 4.22–5.81)
RDW: 18.5 % — ABNORMAL HIGH (ref 11.5–15.5)
WBC: 5.4 10*3/uL (ref 4.0–10.5)
nRBC: 0 % (ref 0.0–0.2)

## 2024-05-13 LAB — COMPREHENSIVE METABOLIC PANEL WITH GFR
ALT: 123 U/L — ABNORMAL HIGH (ref 0–44)
AST: 94 U/L — ABNORMAL HIGH (ref 15–41)
Albumin: 3.2 g/dL — ABNORMAL LOW (ref 3.5–5.0)
Alkaline Phosphatase: 179 U/L — ABNORMAL HIGH (ref 38–126)
Anion gap: 16 — ABNORMAL HIGH (ref 5–15)
BUN: 35 mg/dL — ABNORMAL HIGH (ref 8–23)
CO2: 23 mmol/L (ref 22–32)
Calcium: 8.9 mg/dL (ref 8.9–10.3)
Chloride: 104 mmol/L (ref 98–111)
Creatinine, Ser: 1 mg/dL (ref 0.61–1.24)
GFR, Estimated: >60 mL/min (ref >60)
Glucose, Bld: 184 mg/dL — ABNORMAL HIGH (ref 70–99)
Potassium: 3.9 mmol/L (ref 3.5–5.1)
Sodium: 143 mmol/L (ref 135–145)
Total Bilirubin: 1.2 mg/dL (ref 0.0–1.2)
Total Protein: 6.3 g/dL — ABNORMAL LOW (ref 6.5–8.1)

## 2024-05-13 LAB — GLUCOSE, CAPILLARY
Glucose-Capillary: 163 mg/dL — ABNORMAL HIGH (ref 70–99)
Glucose-Capillary: 165 mg/dL — ABNORMAL HIGH (ref 70–99)
Glucose-Capillary: 121 mg/dL — ABNORMAL HIGH (ref 70–99)
Glucose-Capillary: 165 mg/dL — ABNORMAL HIGH (ref 70–99)
Glucose-Capillary: 203 mg/dL — ABNORMAL HIGH (ref 70–99)

## 2024-05-13 LAB — MAGNESIUM: Magnesium: 2.1 mg/dL (ref 1.7–2.4)

## 2024-05-13 NOTE — Plan of Care (Signed)
  Problem: Fluid Volume: Goal: Hemodynamic stability will improve Outcome: Progressing   Problem: Clinical Measurements: Goal: Diagnostic test results will improve Outcome: Progressing Goal: Signs and symptoms of infection will decrease Outcome: Progressing   Problem: Respiratory: Goal: Ability to maintain adequate ventilation will improve Outcome: Progressing   Problem: Education: Goal: Knowledge of risk factors and measures for prevention of condition will improve Outcome: Progressing   Problem: Coping: Goal: Psychosocial and spiritual needs will be supported Outcome: Progressing   Problem: Respiratory: Goal: Will maintain a patent airway Outcome: Progressing Goal: Complications related to the disease process, condition or treatment will be avoided or minimized Outcome: Progressing   Problem: Education: Goal: Ability to describe self-care measures that may prevent or decrease complications (Diabetes Survival Skills Education) will improve Outcome: Progressing   Problem: Coping: Goal: Ability to adjust to condition or change in health will improve Outcome: Progressing   Problem: Fluid Volume: Goal: Ability to maintain a balanced intake and output will improve Outcome: Progressing   Problem: Health Behavior/Discharge Planning: Goal: Ability to manage health-related needs will improve Outcome: Progressing   Problem: Metabolic: Goal: Ability to maintain appropriate glucose levels will improve Outcome: Progressing   Problem: Nutritional: Goal: Maintenance of adequate nutrition will improve Outcome: Progressing Goal: Progress toward achieving an optimal weight will improve Outcome: Progressing   Problem: Skin Integrity: Goal: Risk for impaired skin integrity will decrease Outcome: Progressing   Problem: Tissue Perfusion: Goal: Adequacy of tissue perfusion will improve Outcome: Progressing   Problem: Education: Goal: Knowledge of General Education information  will improve Description: Including pain rating scale, medication(s)/side effects and non-pharmacologic comfort measures Outcome: Progressing   Problem: Health Behavior/Discharge Planning: Goal: Ability to manage health-related needs will improve Outcome: Progressing   Problem: Clinical Measurements: Goal: Ability to maintain clinical measurements within normal limits will improve Outcome: Progressing Goal: Will remain free from infection Outcome: Progressing Goal: Diagnostic test results will improve Outcome: Progressing Goal: Respiratory complications will improve Outcome: Progressing Goal: Cardiovascular complication will be avoided Outcome: Progressing   Problem: Activity: Goal: Risk for activity intolerance will decrease Outcome: Progressing   Problem: Nutrition: Goal: Adequate nutrition will be maintained Outcome: Progressing   Problem: Coping: Goal: Level of anxiety will decrease Outcome: Progressing   Problem: Elimination: Goal: Will not experience complications related to bowel motility Outcome: Progressing Goal: Will not experience complications related to urinary retention Outcome: Progressing   Problem: Pain Managment: Goal: General experience of comfort will improve and/or be controlled Outcome: Progressing   Problem: Safety: Goal: Ability to remain free from injury will improve Outcome: Progressing   Problem: Skin Integrity: Goal: Risk for impaired skin integrity will decrease Outcome: Progressing

## 2024-05-13 NOTE — Progress Notes (Addendum)
 PROGRESS NOTE  Earl [name] FMW:[id number] DOB: Jan 14, 1941 DOA: 03/04/2024 PCP: [name], MD   LOS: 70 days   Brief Narrative / Interim history: 83 year old male with history of PAF on Eliquis , IDDM 2, HTN, CVA with right-sided residual weakness, MGUS, BPH, systolic CHF who is here with sacral pain.  Of note, he was hospitalized 3/26-4/10 with generalized weakness/acute encephalopathy, hypotension, and was COVID PCR positive.  He was eventually discharged to SNF and readmitted 4 days later with pain in the sacral area and shortness of breath.  He was found to be in A-fib with RVR, tachypneic and was hypotensive on admission for which critical care was consulted.  General surgery was consulted for sacral wound debridement status post bedside excision 4/15 and 03/07/2024.  Subjective / 24h Interval events: Overall feels well this morning.  Daughter is at bedside.  Assesement and Plan: Principal problem Severe sepsis-on admission, patient had lactic acidosis, white count, fever secondary to infected chronic wound.  He has received multiple antibiotics during his long hospitalization.  Sepsis physiology has now resolved and he is currently afebrile, normotensive, not tachycardic.  White count stable, for unknown reasons labs were not done this morning, pending  Active problems Stage IV sacral decubitus wound/ulcer-repeat CT scan on 6/16 with some cortical irregularity along the underlying dorsal mid sacrum, concerning for osteomyelitis, findings appearing to be new.  Infectious disease MD reconsulted and Dr. Dea reviewed the case, did not recommend antibiotics but wound care alone, with plans for antibiotics if and when signs of infection - Dr. Waddell with plastic surgery evaluated patient 6/17, appears that he has a clean sacral decubitus, not a candidate for any type of flap closure.  Per his note, patient nor daughter were interested in surgery at this time.  Plastic surgery  also recommends wound care - He most recently finished [facility]  on 6/13, most recent wound culture done on 6/4 showed Pseudomonas and Klebsiella - Wound care reevaluated, discussed with PT, have been using Aquacel but it appears that on most recent dressing change it appears that it was not covering certain areas, discussed with wound care and change to Vashe, reevaluate this week, appreciate wound care follow-up  Acute on chronic pain-secondary to above, continue pain regimen with oxycodone  as below  Acute on chronic combined CHF, cardiac amyloidosis, nonischemic cardiomyopathy -on tafamidis  and vutrisiran  for amyloid.  Goal-directed medical therapy somewhat limited by prior episodes of hypotension.  Currently there is evidence that he is having anasarca/fluid overload, has been placed on furosemide , continue.  Still has fluid overload in the abdomen and upper thighs.  For unknown reasons did not get labs done yet this morning  PAF-continue amiodarone  and Eliquis   Anemia of chronic disease-received a unit of packed red blood cells on 4/30.  Hemoglobin has remained stable, no bleeding  Dysphagia/severe protein calorie malnutrition-continue supplementation, pured diet  Physical debilitation, muscle weakness-PT/OT ongoing  Goals of care -palliative consulted as well, appears to be DNR.  Daughter wants him to transition at home once his wounds are better  IDDM -continue Semglee , sliding scale, Accu-Cheks  CBG (last 3)  Recent Labs    05/12/24 1601 05/12/24 1958 05/13/24 0836  GLUCAP 195* 166* 163*   Lab Results  Component Value Date   HGBA1C 8.4 (H) 02/22/2024   Scheduled Meds:  acetaminophen   500 mg Oral TID   amiodarone   200 mg Oral Daily   apixaban   5 mg Oral BID   atorvastatin   40 mg Oral Daily   Chlorhexidine   Gluconate Cloth  6 each Topical Daily   collagenase    Topical Daily   diclofenac  Sodium  4 g Topical QID   feeding supplement  237 mL Oral BID BM   ferrous sulfate   325  mg Oral QHS   furosemide   40 mg Intravenous BID   guaiFENesin   600 mg Oral BID   insulin  aspart  0-5 Units Subcutaneous QHS   insulin  aspart  0-9 Units Subcutaneous TID WC   insulin  glargine-yfgn  8 Units Subcutaneous Daily   levothyroxine   50 mcg Oral Q0600   liver oil-zinc  oxide   Topical BID   multivitamin with minerals  1 tablet Oral Daily   nutrition supplement (JUVEN)  1 packet Oral BID BM   mouth rinse  15 mL Mouth Rinse 4 times per day   oxyCODONE   5 mg Oral QHS   phosphorus  500 mg Oral TID   polyethylene glycol  17 g Oral BID   senna-docusate  1 tablet Oral QHS   sertraline   25 mg Oral Daily   sodium chloride  flush  10-40 mL Intracatheter Q12H   Tafamidis   1 capsule Oral Daily   vutrisiran  sodium  25 mg Subcutaneous Q90 days   Continuous Infusions:  dextrose  Stopped (05/01/24 0003)   PRN Meds:.acetaminophen  **OR** acetaminophen , food thickener, hydrOXYzine , lip balm, ondansetron  **OR** ondansetron  (ZOFRAN ) IV, mouth rinse, oxyCODONE , polyethylene glycol, artificial tears  Current Outpatient Medications  Medication Instructions   Amino Acids -Protein Hydrolys (PRO-STAT AWC) LIQD 30 mLs, Oral, 2 times daily   AMVUTTRA  25 MG/0.5ML syringe INJECT 25MG  (0.5ML) SUBCUTANEOUSLY ONCE EVERY 3 MONTHS. ROTATE ADMINISTRATION SITE WITH EACH INJECTION. *REFRIGERATE. DO NOT SHAKE.*   ascorbic acid  (VITAMIN C ) 500 mg, Oral, Daily,     atorvastatin  (LIPITOR) 40 MG tablet Take  1 tablet  Daily for Cholesterol                                                       /                                  TAKE                                                       BY                                           MOUTH   B-12 50 mcg, Oral, Daily   Blood Glucose Monitoring Suppl (ONE TOUCH ULTRA 2) w/Device KIT Check blood sugar 1 time a day-DX-E11.9   Cholecalciferol  (VITAMIN D  PO) 1 tablet, Oral, Daily   ELIQUIS  5 MG TABS tablet TAKE 1 TABLET BY MOUTH TWICE  DAILY TO PREVENT BLOOD CLOTS   Emollient  (AQUAPHOR ADV PROTECT HEALING) OINT Apply small amount to two areas on buttocks daily to prevent breakdown   ferrous sulfate  325 mg, Oral, Daily at bedtime   finasteride  (PROSCAR ) 5 MG tablet TAKE 1 TABLET BY MOUTH DAILY FOR PROSTATE  glucose blood (ONETOUCH ULTRA) test strip Use as instructed   Lancets (ONETOUCH DELICA PLUS LANCET30G) MISC CHECK BLOOD SUGAR ONCE A DAY.   Lancets Misc. (ONE TOUCH SURESOFT) MISC Check blood sugar 1 time a day-DX-E11.91   levothyroxine  (SYNTHROID ) 50 MCG tablet TAKE 1 TABLET BY MOUTH DAILY ON  AN EMPTY STOMACH WITH ONLY WATER FOR 30 MINUTES AND NO ANTACID,  MEDS, CALCIUM  OR MAGNESIUM  FOR 4 HOURS AND AVOID BIOTIN   Magnesium  250 mg, Oral, Daily   metFORMIN  (GLUCOPHAGE -XR) 500 MG 24 hr tablet TAKE 2 TABLETS BY MOUTH TWICE  DAILY WITH MEALS FOR DIABETES   metoprolol  succinate (TOPROL -XL) 25 mg, Oral, Daily at bedtime   Multiple Vitamin (MULTIVITAMIN) capsule 1 capsule, Oral, Daily,     NONFORMULARY OR COMPOUNDED ITEM Please dispense a Rolling Walker.  Dx:R26.81 & G63   Nutritional Supplements (BOOST GLUCOSE CONTROL) LIQD 237 mLs, Oral, 2 times daily after meals   potassium chloride  SA (KLOR-CON  M20) 20 MEQ tablet 20 mEq, Oral, Daily   sacubitril -valsartan  (ENTRESTO ) 24-26 MG 1 tablet, Oral, 2 times daily   Tafamidis  (VYNDAMAX ) 61 MG CAPS Take 1 capsule by mouth daily.    Diet Orders (From admission, onward)     Start     Ordered   04/30/24 1436  DIET - DYS 1 Room service appropriate? No; Fluid consistency: Thin  Diet effective now       Question Answer Comment  Room service appropriate? No   Fluid consistency: Thin      04/30/24 1436            DVT prophylaxis: SCDs Start: 03/04/24 2151 Place TED hose Start: 03/04/24 2151 apixaban  (ELIQUIS ) tablet 5 mg   Lab Results  Component Value Date   PLT 215 05/11/2024      Code Status: Limited: Do not attempt resuscitation (DNR) -DNR-LIMITED -Do Not Intubate/DNI   Family Communication: No family at  bedside  Status is: Inpatient Remains inpatient appropriate because: severity of illness  Level of care: Telemetry Medical  Consultants:  Palliative ID Surgery   Objective: Vitals:   05/12/24 2000 05/13/24 0023 05/13/24 0513 05/13/24 0838  BP: 134/72 130/65 126/69 134/70  Pulse: 78 75 80 80  Resp: 19 20 17 20   Temp: 97.7 F (36.5 C) (!) 97.5 F (36.4 C) 97.7 F (36.5 C) 97.7 F (36.5 C)  TempSrc:  Axillary    SpO2: 98% 92% 92% 100%  Weight:   79.8 kg   Height:        Intake/Output Summary (Last 24 hours) at 05/13/2024 0908 Last data filed at 05/13/2024 0543 Gross per 24 hour  Intake --  Output 1550 ml  Net -1550 ml   Wt Readings from Last 3 Encounters:  05/13/24 79.8 kg  02/14/24 72.1 kg  01/24/24 72.1 kg    Examination:  Constitutional: NAD Eyes: lids and conjunctivae normal, no scleral icterus ENMT: mmm Neck: normal, supple Respiratory: clear to auscultation bilaterally, no wheezing, no crackles. Normal respiratory effort.  Cardiovascular: Regular rate and rhythm, no murmurs / rubs / gallops. No pretibial edema but pitting edema lower abdomen and upper thighs. Abdomen: soft, no distention, no tenderness. Bowel sounds positive.  Skin: no rashes Neurologic: no focal deficits, equal strength  Data Reviewed: I have independently reviewed following labs and imaging studies  CBC Recent Labs  Lab 05/07/24 0420 05/08/24 0435 05/09/24 0349 05/11/24 0606  WBC 6.7 6.3 6.7 5.0  HGB 10.9* 11.2* 11.2* 11.0*  HCT 34.4* 35.8* 35.5* 33.9*  PLT 233  224 229 215  MCV 89.8 91.1 88.1 88.1  MCH 28.5 28.5 27.8 28.6  MCHC 31.7 31.3 31.5 32.4  RDW 18.0* 18.2* 18.2* 18.3*    Recent Labs  Lab 05/07/24 0420 05/08/24 0435 05/09/24 0349 05/11/24 0606  NA 143 142 141 143  K 3.9 3.4* 3.8 3.3*  CL 108 107 108 109  CO2 25 26 26 26   GLUCOSE 173* 179* 138* 69*  BUN 35* 32* 33* 38*  CREATININE 1.00 1.00 0.91 0.95  CALCIUM  8.6* 8.4* 8.6* 8.6*  AST 32 34 68* 54*  ALT  34 37 61* 62*  ALKPHOS 112 113 133* 133*  BILITOT 0.8 0.9 1.2 0.9  ALBUMIN  2.7* 2.6* 2.8* 2.6*  MG 1.9  --  1.9 1.8    ------------------------------------------------------------------------------------------------------------------ No results for input(s): CHOL, HDL, LDLCALC, TRIG, CHOLHDL, LDLDIRECT in the last 72 hours.  Lab Results  Component Value Date   HGBA1C 8.4 (H) 02/22/2024   ------------------------------------------------------------------------------------------------------------------ No results for input(s): TSH, T4TOTAL, T3FREE, THYROIDAB in the last 72 hours.  Invalid input(s): FREET3  Cardiac Enzymes No results for input(s): CKMB, TROPONINI, MYOGLOBIN in the last 168 hours.  Invalid input(s): CK ------------------------------------------------------------------------------------------------------------------    Component Value Date/Time   BNP 99.2 08/23/2023 1541   BNP 180.9 (H) 07/06/2022 1133   BNP 762 (H) 07/23/2021 0951    CBG: Recent Labs  Lab 05/12/24 0823 05/12/24 1253 05/12/24 1601 05/12/24 1958 05/13/24 0836  GLUCAP 136* 167* 195* 166* 163*    Recent Results (from the past 240 hours)  Body fluid culture w Gram Stain     Status: None   Collection Time: 05/08/24  1:50 PM   Specimen: Lung, Left; Pleural Fluid  Result Value Ref Range Status   Specimen Description FLUID  Final   Special Requests NONE  Final   Gram Stain NO WBC SEEN NO ORGANISMS SEEN   Final   Culture   Final    NO GROWTH 3 DAYS Performed at [facility], [address] [place], [place] [address]    Report Status 05/11/2024 FINAL  Final     Radiology Studies: No results found.    Nilda Fendt, MD, PhD [facility]  Between 7 am - 7 pm I am available, please contact me via Amion (for emergencies) or Securechat (non urgent messages)  Between 7 pm - 7 am I am not available, please contact night coverage MD/APP via  Amion

## 2024-05-13 NOTE — Progress Notes (Signed)
 Physical Therapy Treatment Patient Details Name: Earl [name] MRN: [id number] DOB: [birth year] Today's Date: 05/13/2024   History of Present Illness 83 y.o. male admitted from [facility] SNF rehab on 03/04/24 with SOB, sacral pain, Afib with RVR. Pt with acute metabolic encephalopathy, rhabdomyolysis and sepsis secondary to sacral wound infection. 4/15 & 4/17 bedside sacral wound debridement. PMhx: adm 3/26-4/08/2024 with fall, AMS, COVID; d/c to SNF rehab. CVA (2021, residual R-side weakness), T2DM, HTN, CHF, MGUS, Afib on Eliquis .    PT Comments  Pt pleasant and with DOE despite SPO2 90-93% on RA throughout session. Pt able to tolerate increased standing trials with stedy this session up to 30 sec at a time with min-mod +2 assist. Pt states goal is to return to walking and needs to continue to progress functional strength to achieve this. Pt on air mattress but requests pillows under thighs, calves and arms with pt educated for purpose of air mattress and pressure relief. Will continue to follow with goals updated. Daughter Murrell stated desire for pt to return home once wound improved and would require DME. Patient will benefit from continued inpatient follow up therapy, <3 hours/day until wound appropriate for return home.    If plan is discharge home, recommend the following: Two people to help with walking and/or transfers;Two people to help with bathing/dressing/bathroom;Assistance with feeding;Assist for transportation   Can travel by private vehicle     No  Equipment Recommendations  Hospital bed;Hoyer lift;Wheelchair cushion (measurements PT);Wheelchair (measurements PT)    Recommendations for Other Services       Precautions / Restrictions Precautions Precautions: Fall;Other (comment) Recall of Precautions/Restrictions: Impaired Precaution/Restrictions Comments: sacral wound, rt lean     Mobility  Bed Mobility Overal bed mobility: Needs Assistance Bed Mobility: Rolling,  Supine to Sit Rolling: Mod assist   Supine to sit: Mod assist, HOB elevated, Used rails     General bed mobility comments: mod assist to roll bil for pad positioning and abdominal binder placement. Mod assist with HOB 25 degrees to achieve full sitting and pivot to EOB. REturn to bed max +2 assist to control trunk and elevate LB    Transfers Overall transfer level: Needs assistance   Transfers: Sit to/from Stand Sit to Stand: Mod assist, +2 physical assistance, From elevated surface, Via lift equipment           General transfer comment: pt able to stand from elevated bed to stedy x 5 trials with varied assist of min -mod +2 assist. cues for hand and foot placement with pillow between knees and pad. Pt able to stand 30 sec x 2 trials. mod multimodal cues for hip/trunk/knee extension. scooted up in bed via stedy Transfer via Lift Equipment: Stedy  Ambulation/Gait               General Gait Details: Non-ambulatory recent baseline   Optometrist     Tilt Bed    Modified Rankin (Stroke Patients Only)       Balance Overall balance assessment: Needs assistance Sitting-balance support: Bilateral upper extremity supported, Feet supported, No upper extremity supported Sitting balance-Leahy Scale: Poor Sitting balance - Comments: pt with rt lateral bias, able to maintain static sitting roughly 20 sec at a time unassisted. mod cues in stedy for midline   Standing balance support: Bilateral upper extremity supported, Reliant on assistive device for balance Standing balance-Leahy Scale: Zero  Communication Communication Communication: Impaired Factors Affecting Communication: Reduced clarity of speech  Cognition Arousal: Alert Behavior During Therapy: Flat affect   PT - Cognitive impairments: Problem solving                         Following commands: Impaired Following commands  impaired: Follows one step commands inconsistently, Follows one step commands with increased time    Cueing Cueing Techniques: Verbal cues, Gestural cues, Tactile cues  Exercises      General Comments        Pertinent Vitals/Pain Pain Assessment Pain Score: 3  Pain Location: abdomen and legs Pain Descriptors / Indicators: Discomfort, Aching Pain Intervention(s): Limited activity within patient's tolerance, Monitored during session, Repositioned, Other (comment) (pt denied offer of premedication)    Home Living                          Prior Function            PT Goals (current goals can now be found in the care plan section) Acute Rehab PT Goals Patient Stated Goal: walk PT Goal Formulation: With patient Time For Goal Achievement: 05/27/24 Potential to Achieve Goals: Fair Progress towards PT goals: Progressing toward goals    Frequency    Min 1X/week      PT Plan      Co-evaluation              AM-PAC PT 6 Clicks Mobility   Outcome Measure  Help needed turning from your back to your side while in a flat bed without using bedrails?: A Lot Help needed moving from lying on your back to sitting on the side of a flat bed without using bedrails?: A Lot Help needed moving to and from a bed to a chair (including a wheelchair)?: Total Help needed standing up from a chair using your arms (e.g., wheelchair or bedside chair)?: Total Help needed to walk in hospital room?: Total Help needed climbing 3-5 steps with a railing? : Total 6 Click Score: 8    End of Session Equipment Utilized During Treatment: Gait belt;Other (comment) (abdominal binder) Activity Tolerance: Patient tolerated treatment well Patient left: in bed;with call bell/phone within reach;with bed alarm set Nurse Communication: Mobility status;Need for lift equipment PT Visit Diagnosis: Muscle weakness (generalized) (M62.81);Adult, failure to thrive (R62.7);Difficulty in walking, not  elsewhere classified (R26.2);Pain;Other abnormalities of gait and mobility (R26.89)     Time: 8841-8772 PT Time Calculation (min) (ACUTE ONLY): 29 min  Charges:    $Therapeutic Activity: 23-37 mins PT General Charges $$ ACUTE PT VISIT: 1 Visit                     Lenoard SQUIBB, PT Acute Rehabilitation Services Office: [phone]    Lenoard NOVAK Annabeth Tortora 05/13/2024, 1:29 PM

## 2024-05-14 DIAGNOSIS — A419 Sepsis, unspecified organism: Secondary | ICD-10-CM | POA: Diagnosis not present

## 2024-05-14 DIAGNOSIS — E43 Unspecified severe protein-calorie malnutrition: Secondary | ICD-10-CM | POA: Diagnosis not present

## 2024-05-14 DIAGNOSIS — Z515 Encounter for palliative care: Secondary | ICD-10-CM | POA: Diagnosis not present

## 2024-05-14 DIAGNOSIS — F419 Anxiety disorder, unspecified: Secondary | ICD-10-CM

## 2024-05-14 DIAGNOSIS — I5022 Chronic systolic (congestive) heart failure: Secondary | ICD-10-CM | POA: Diagnosis not present

## 2024-05-14 DIAGNOSIS — R6520 Severe sepsis without septic shock: Secondary | ICD-10-CM | POA: Diagnosis not present

## 2024-05-14 LAB — GLUCOSE, CAPILLARY
Glucose-Capillary: 139 mg/dL — ABNORMAL HIGH (ref 70–99)
Glucose-Capillary: 142 mg/dL — ABNORMAL HIGH (ref 70–99)
Glucose-Capillary: 154 mg/dL — ABNORMAL HIGH (ref 70–99)
Glucose-Capillary: 150 mg/dL — ABNORMAL HIGH (ref 70–99)

## 2024-05-14 MED ORDER — FUROSEMIDE 10 MG/ML IJ SOLN
40.0000 mg | Freq: Two times a day (BID) | INTRAMUSCULAR | Status: DC
  Administered 2024-05-14 – 2024-05-18 (×9): 40 mg via INTRAVENOUS
  Filled 2024-05-14 (×9): qty 4

## 2024-05-14 MED ORDER — ALBUMIN HUMAN 25 % IV SOLN
25.0000 g | Freq: Once | INTRAVENOUS | Status: AC
  Administered 2024-05-14: 25 g via INTRAVENOUS
  Filled 2024-05-14: qty 100

## 2024-05-14 NOTE — Progress Notes (Signed)
 Physical Therapy Wound Treatment Patient Details  Name: Earl [name] MRN: [id number] Date of Birth: 06-Dec-1940  Today's Date: 05/14/2024 Time: 8669-8580 Time Calculation (min): 49 min  Subjective  Subjective Assessment Subjective: Pt pleasant and agreeable to wound therapy. Patient and Family Stated Goals: Heal wounds Date of Onset:  (Unknown) Prior Treatments: Dressing changes, off weighting  Pain Score:  Pt declined premedication for pain, however overall appeared comfortable during treatment session.  Wound Assessment  Pressure Injury 03/06/24 Hip Anterior;Left;Proximal Unstageable - Full thickness tissue loss in which the base of the injury is covered by slough (yellow, tan, gray, green or brown) and/or eschar (tan, brown or black) in the wound bed. unstageable (Active)  Wound Image   05/14/24 1432  Dressing Type Foam - Lift dressing to assess site every shift;Gauze (Comment);Santyl ;Barrier Film (skin prep);Moist to moist 05/14/24 1432  Dressing Changed 05/14/24 1432  Dressing Change Frequency Daily 05/14/24 1432  State of Healing Eschar 05/14/24 1432  Site / Wound Assessment Yellow 05/14/24 1432  % Wound base Red or Granulating 5% 05/14/24 1432  % Wound base Yellow/Fibrinous Exudate 95% 05/14/24 1432  % Wound base Black/Eschar 0% 05/14/24 1432  % Wound base Other/Granulation Tissue (Comment) 0% 05/14/24 1432  Peri-wound Assessment Intact;Pink 05/14/24 1432  Wound Length (cm) 4.7 cm 05/09/24 1509  Wound Width (cm) 3.2 cm 05/09/24 1509  Wound Depth (cm) 0.1 cm 05/09/24 1509  Wound Surface Area (cm^2) 15.04 cm^2 05/09/24 1509  Wound Volume (cm^3) 1.5 cm^3 05/09/24 1509  Undermining (cm) Undermining area ~0.4 cm 12:00-1:00 opened up with debridement. 05/10/24 1145  Margins Unattached edges (unapproximated) 05/14/24 1432  Drainage Amount Minimal 05/14/24 1432  Drainage Description Serosanguineous;Green;No odor 05/14/24 1432  Treatment Debridement  (Selective);Irrigation;Packing (Saline gauze) 05/14/24 1432      Selective Debridement (non-excisional) Selective Debridement (non-excisional) - Location: L hip Selective Debridement (non-excisional) - Tools Used: Forceps, Scalpel Selective Debridement (non-excisional) - Tissue Removed: yellow and blue/green unviable tissue    Wound Assessment and Plan  Wound Therapy - Assess/Plan/Recommendations Wound Therapy - Clinical Statement: Noted wound bed and gauze removed from wound were blue/green upon initial dressing change. Notified WOC-RN and bedside RN. Pt may benefit from a 3 day round of Dakin's solution. This patient will benefit from continued wound therapy of the L hip for selective removal of unviable tissue, to decrease bioburden, and promote wound bed healing. Will continue to follow. Wound Therapy - Functional Problem List: Decreased tolerance for position changes or OOB. Factors Delaying/Impairing Wound Healing: Diabetes Mellitus, Immobility, Infection - systemic/local, Multiple medical problems Hydrotherapy Plan: Debridement, Dressing change, Patient/family education Wound Therapy - Frequency: 2X / week Wound Therapy - Follow Up Recommendations: dressing changes by RN  Wound Therapy Goals- Improve the function of patient's integumentary system by progressing the wound(s) through the phases of wound healing (inflammation - proliferation - remodeling) by: Wound Therapy Goals - Improve the function of patient's integumentary system by progressing the wound(s) through the phases of wound healing by: Decrease Necrotic Tissue to: 20% Decrease Necrotic Tissue - Progress: Progressing toward goal Increase Granulation Tissue to: 80% Increase Granulation Tissue - Progress: Progressing toward goal Goals/treatment plan/discharge plan were made with and agreed upon by patient/family: Yes Time For Goal Achievement: 7 days Wound Therapy - Potential for Goals: Good  Goals will be updated until  maximal potential achieved or discharge criteria met.  Discharge criteria: when goals achieved, discharge from hospital, MD decision/surgical intervention, no progress towards goals, refusal/missing three consecutive treatments without notification or medical reason.  GP     Charges PT Wound Care Charges $Wound Debridement up to 20 cm: < or equal to 20 cm $PT Hydrotherapy Dressing: 2 dressings $PT Hydrotherapy Visit: 1 Visit       [name] 05/14/2024, 2:38 PM  Leita Sable, PT, DPT Acute Rehabilitation Services Secure Chat Preferred Office: [phone]

## 2024-05-14 NOTE — Progress Notes (Addendum)
 PROGRESS NOTE  Earl [name] FMW:[id number] DOB: 30-Aug-1941 DOA: 03/04/2024 PCP: [name], MD   LOS: 71 days   Brief Narrative / Interim history: 83 year old male with history of PAF on Eliquis , IDDM 2, HTN, CVA with right-sided residual weakness, MGUS, BPH, systolic CHF who is here with sacral pain.  Of note, he was hospitalized 3/26-4/10 with generalized weakness/acute encephalopathy, hypotension, and was COVID PCR positive.  He was eventually discharged to SNF and readmitted 4 days later with pain in the sacral area and shortness of breath.  He was found to be in A-fib with RVR, tachypneic and was hypotensive on admission for which critical care was consulted.  General surgery was consulted for sacral wound debridement status post bedside excision 4/15 and 03/07/2024.  Subjective / 24h Interval events: No complaints this morning, hanging in there  Assesement and Plan: Principal problem Stage IV sacral decubitus wound/ulcer-repeat CT scan on 6/16 with some cortical irregularity along the underlying dorsal mid sacrum, concerning for osteomyelitis, findings appearing to be new.  Infectious disease MD reconsulted and Dr. Dea reviewed the case, did not recommend antibiotics but wound care alone, with plans for antibiotics if and when signs of infection - Dr. Waddell with plastic surgery evaluated patient 6/17, appears that he has a clean sacral decubitus, not a candidate for any type of flap closure.  Per his note, patient nor daughter were interested in surgery at this time.  Plastic surgery also recommends wound care - He most recently finished [facility]  on 6/13, most recent wound culture done on 6/4 showed Pseudomonas and Klebsiella - Wound care reevaluated, discussed with PT, have been using Aquacel but it appears that on most recent dressing change it appears that it was not covering certain areas, discussed with wound care and change to Vashe, reevaluate this week, appreciate  wound care follow-up  Active problems Severe sepsis-on admission, patient had lactic acidosis, white count, fever secondary to infected chronic wound.  He has received multiple antibiotics during his long hospitalization.  Sepsis physiology has now resolved and he is currently afebrile, normotensive, not tachycardic.  White count is now stable  Acute on chronic pain-secondary to above, continue pain regimen with oxycodone  as below  Acute on chronic combined CHF, cardiac amyloidosis, nonischemic cardiomyopathy -on tafamidis  and vutrisiran  for amyloid.  Goal-directed medical therapy somewhat limited by prior episodes of hypotension.  Currently there is evidence that he is having anasarca/fluid overload, has been placed on furosemide , continue.  Still remains with fluid overload in the abdomen and upper thighs mostly.  Renal function is stable, continue furosemide .  Will give another dose of albumin  today  PAF-continue amiodarone  and Eliquis   Anemia of chronic disease-received a unit of packed red blood cells on 4/30.  Hemoglobin has remained stable, no bleeding  Elevated LFTs-mild, no nausea, no vomiting, no right upper quadrant pain.  Probably due to receiving Tylenol  around-the-clock, discussed with the bedside RN to hold Tylenol  dosing today  Dysphagia/severe protein calorie malnutrition-continue supplementation, pured diet  Physical debilitation, muscle weakness-PT/OT ongoing  Goals of care -palliative consulted as well, appears to be DNR.  Daughter wants him to transition at home once his wounds are better  IDDM -continue Semglee , sliding scale, Accu-Cheks  CBG (last 3)  Recent Labs    05/13/24 1827 05/13/24 1953 05/14/24 0830  GLUCAP 165* 203* 139*   Lab Results  Component Value Date   HGBA1C 8.4 (H) 02/22/2024   Scheduled Meds:  acetaminophen   500 mg Oral TID   amiodarone   200 mg Oral Daily   apixaban   5 mg Oral BID   atorvastatin   40 mg Oral Daily   Chlorhexidine   Gluconate Cloth  6 each Topical Daily   collagenase    Topical Daily   diclofenac  Sodium  4 g Topical QID   feeding supplement  237 mL Oral BID BM   ferrous sulfate   325 mg Oral QHS   furosemide   40 mg Intravenous BID AC & HS   guaiFENesin   600 mg Oral BID   insulin  aspart  0-5 Units Subcutaneous QHS   insulin  aspart  0-9 Units Subcutaneous TID WC   insulin  glargine-yfgn  8 Units Subcutaneous Daily   levothyroxine   50 mcg Oral Q0600   liver oil-zinc  oxide   Topical BID   multivitamin with minerals  1 tablet Oral Daily   nutrition supplement (JUVEN)  1 packet Oral BID BM   mouth rinse  15 mL Mouth Rinse 4 times per day   oxyCODONE   5 mg Oral QHS   phosphorus  500 mg Oral TID   polyethylene glycol  17 g Oral BID   senna-docusate  1 tablet Oral QHS   sertraline   25 mg Oral Daily   sodium chloride  flush  10-40 mL Intracatheter Q12H   Tafamidis   1 capsule Oral Daily   vutrisiran  sodium  25 mg Subcutaneous Q90 days   Continuous Infusions:  dextrose  Stopped (05/01/24 0003)   PRN Meds:.acetaminophen  **OR** acetaminophen , food thickener, hydrOXYzine , lip balm, ondansetron  **OR** ondansetron  (ZOFRAN ) IV, mouth rinse, oxyCODONE , polyethylene glycol, artificial tears  Current Outpatient Medications  Medication Instructions   Amino Acids -Protein Hydrolys (PRO-STAT AWC) LIQD 30 mLs, Oral, 2 times daily   AMVUTTRA  25 MG/0.5ML syringe INJECT 25MG  (0.5ML) SUBCUTANEOUSLY ONCE EVERY 3 MONTHS. ROTATE ADMINISTRATION SITE WITH EACH INJECTION. *REFRIGERATE. DO NOT SHAKE.*   ascorbic acid  (VITAMIN C ) 500 mg, Oral, Daily,     atorvastatin  (LIPITOR) 40 MG tablet Take  1 tablet  Daily for Cholesterol                                                       /                                  TAKE                                                       BY                                           MOUTH   B-12 50 mcg, Oral, Daily   Blood Glucose Monitoring Suppl (ONE TOUCH ULTRA 2) w/Device KIT Check blood sugar 1  time a day-DX-E11.9   Cholecalciferol  (VITAMIN D  PO) 1 tablet, Oral, Daily   ELIQUIS  5 MG TABS tablet TAKE 1 TABLET BY MOUTH TWICE  DAILY TO PREVENT BLOOD CLOTS   Emollient (AQUAPHOR ADV PROTECT HEALING) OINT Apply small amount to two areas on buttocks daily to prevent breakdown  ferrous sulfate  325 mg, Oral, Daily at bedtime   finasteride  (PROSCAR ) 5 MG tablet TAKE 1 TABLET BY MOUTH DAILY FOR PROSTATE   glucose blood (ONETOUCH ULTRA) test strip Use as instructed   Lancets (ONETOUCH DELICA PLUS LANCET30G) MISC CHECK BLOOD SUGAR ONCE A DAY.   Lancets Misc. (ONE TOUCH SURESOFT) MISC Check blood sugar 1 time a day-DX-E11.91   levothyroxine  (SYNTHROID ) 50 MCG tablet TAKE 1 TABLET BY MOUTH DAILY ON  AN EMPTY STOMACH WITH ONLY WATER FOR 30 MINUTES AND NO ANTACID,  MEDS, CALCIUM  OR MAGNESIUM  FOR 4 HOURS AND AVOID BIOTIN   Magnesium  250 mg, Oral, Daily   metFORMIN  (GLUCOPHAGE -XR) 500 MG 24 hr tablet TAKE 2 TABLETS BY MOUTH TWICE  DAILY WITH MEALS FOR DIABETES   metoprolol  succinate (TOPROL -XL) 25 mg, Oral, Daily at bedtime   Multiple Vitamin (MULTIVITAMIN) capsule 1 capsule, Oral, Daily,     NONFORMULARY OR COMPOUNDED ITEM Please dispense a Rolling Walker.  Dx:R26.81 & G63   Nutritional Supplements (BOOST GLUCOSE CONTROL) LIQD 237 mLs, Oral, 2 times daily after meals   potassium chloride  SA (KLOR-CON  M20) 20 MEQ tablet 20 mEq, Oral, Daily   sacubitril -valsartan  (ENTRESTO ) 24-26 MG 1 tablet, Oral, 2 times daily   Tafamidis  (VYNDAMAX ) 61 MG CAPS Take 1 capsule by mouth daily.    Diet Orders (From admission, onward)     Start     Ordered   04/30/24 1436  DIET - DYS 1 Room service appropriate? No; Fluid consistency: Thin  Diet effective now       Question Answer Comment  Room service appropriate? No   Fluid consistency: Thin      04/30/24 1436            DVT prophylaxis: SCDs Start: 03/04/24 2151 Place TED hose Start: 03/04/24 2151 apixaban  (ELIQUIS ) tablet 5 mg   Lab Results   Component Value Date   PLT 244 05/13/2024      Code Status: Limited: Do not attempt resuscitation (DNR) -DNR-LIMITED -Do Not Intubate/DNI   Family Communication: No family at bedside  Status is: Inpatient Remains inpatient appropriate because: severity of illness  Level of care: Telemetry Medical  Consultants:  Palliative ID Surgery   Objective: Vitals:   05/13/24 1827 05/13/24 1951 05/14/24 0414 05/14/24 0820  BP: 132/67 (!) 144/73 130/63 130/63  Pulse: 65 72 73 73  Resp: 18 18 18 18   Temp: (!) 97.5 F (36.4 C) 97.6 F (36.4 C) 97.8 F (36.6 C) 98 F (36.7 C)  TempSrc:      SpO2: 100% 100% 100%   Weight:      Height:        Intake/Output Summary (Last 24 hours) at 05/14/2024 0837 Last data filed at 05/14/2024 0415 Gross per 24 hour  Intake 607 ml  Output 2375 ml  Net -1768 ml   Wt Readings from Last 3 Encounters:  05/13/24 79.8 kg  02/14/24 72.1 kg  01/24/24 72.1 kg    Examination:  Constitutional: NAD Eyes: lids and conjunctivae normal, no scleral icterus ENMT: mmm Neck: normal, supple Respiratory: clear to auscultation bilaterally, no wheezing, no crackles. Normal respiratory effort.  Cardiovascular: Regular rate and rhythm, no murmurs / rubs / gallops. No LE edema. Abdomen: soft, no distention, no tenderness. Bowel sounds positive.   Data Reviewed: I have independently reviewed following labs and imaging studies  CBC Recent Labs  Lab 05/08/24 0435 05/09/24 0349 05/11/24 0606 05/13/24 0937  WBC 6.3 6.7 5.0 5.4  HGB 11.2* 11.2* 11.0* 11.5*  HCT 35.8* 35.5* 33.9* 35.8*  PLT 224 229 215 244  MCV 91.1 88.1 88.1 88.8  MCH 28.5 27.8 28.6 28.5  MCHC 31.3 31.5 32.4 32.1  RDW 18.2* 18.2* 18.3* 18.5*    Recent Labs  Lab 05/08/24 0435 05/09/24 0349 05/11/24 0606 05/13/24 0937  NA 142 141 143 143  K 3.4* 3.8 3.3* 3.9  CL 107 108 109 104  CO2 26 26 26 23   GLUCOSE 179* 138* 69* 184*  BUN 32* 33* 38* 35*  CREATININE 1.00 0.91 0.95 1.00   CALCIUM  8.4* 8.6* 8.6* 8.9  AST 34 68* 54* 94*  ALT 37 61* 62* 123*  ALKPHOS 113 133* 133* 179*  BILITOT 0.9 1.2 0.9 1.2  ALBUMIN  2.6* 2.8* 2.6* 3.2*  MG  --  1.9 1.8 2.1    ------------------------------------------------------------------------------------------------------------------ No results for input(s): CHOL, HDL, LDLCALC, TRIG, CHOLHDL, LDLDIRECT in the last 72 hours.  Lab Results  Component Value Date   HGBA1C 8.4 (H) 02/22/2024   ------------------------------------------------------------------------------------------------------------------ No results for input(s): TSH, T4TOTAL, T3FREE, THYROIDAB in the last 72 hours.  Invalid input(s): FREET3  Cardiac Enzymes No results for input(s): CKMB, TROPONINI, MYOGLOBIN in the last 168 hours.  Invalid input(s): CK ------------------------------------------------------------------------------------------------------------------    Component Value Date/Time   BNP 99.2 08/23/2023 1541   BNP 180.9 (H) 07/06/2022 1133   BNP 762 (H) 07/23/2021 0951    CBG: Recent Labs  Lab 05/13/24 1204 05/13/24 1438 05/13/24 1827 05/13/24 1953 05/14/24 0830  GLUCAP 165* 121* 165* 203* 139*    Recent Results (from the past 240 hours)  Body fluid culture w Gram Stain     Status: None   Collection Time: 05/08/24  1:50 PM   Specimen: Lung, Left; Pleural Fluid  Result Value Ref Range Status   Specimen Description FLUID  Final   Special Requests NONE  Final   Gram Stain NO WBC SEEN NO ORGANISMS SEEN   Final   Culture   Final    NO GROWTH 3 DAYS Performed at [facility], [address], [place], [place] [address]    Report Status 05/11/2024 FINAL  Final     Radiology Studies: No results found.    Nilda Fendt, MD, PhD [facility]  Between 7 am - 7 pm I am available, please contact me via Amion (for emergencies) or Securechat (non urgent messages)  Between 7 pm - 7 am I am  not available, please contact night coverage MD/APP via Amion

## 2024-05-14 NOTE — Progress Notes (Signed)
 Daily Progress Note   Patient Name: Earl [name]       Date: 05/14/2024 DOB: 1941-07-16  Age: 83 y.o. MRN#: [id number] Attending Physician: Earl Nilda HERO, MD Primary Care Physician: Earl Madden, MD Admit Date: 03/04/2024  Reason for Consultation/Follow-up: Establishing goals of care  Subjective: Medical records reviewed including progress notes, labs, imaging, MAR. PRN Atarax  given x1 in the past 24 hours. No oxycodone . Patient assessed at the bedside. He is glad to report that his anxiety is well controlled. No family was present during my visit. He was receiving wound care and reporting some pain. States his appetite is still poor.  Created space and opportunity for patient's thoughts and feelings on patient's current illness. Reviewed option of increasing dose or frequency of PRN Atarax . Patient declines at this time and wishes to continue as currently ordered. Shared that I would call his daughter with an update. Explored his thoughts on Zoloft  as well and he would like to continue anxiety management as is.  Called Earl [name] and provided update on the above. She is appreciative. She agrees with keeping medications without adjustments and also notes her preference for patient to avoid taking Atarax  at the same time as Oxycodone . We discussed minimal addictive risks with Atarax . She is also thinking ahead to the rest of the week and wondering how patient's care will be managed by the next attending. She hopes to see Dr. Trixie in the morning tomorrow. Discussed with MD.  Questions and concerns addressed. PMT will continue to support holistically.  Length of Stay: 34   Physical Exam Vitals and nursing note reviewed.  Constitutional:      General: He is not in acute distress.    Appearance: He is ill-appearing.    Cardiovascular:     Rate and Rhythm: Normal rate.  Pulmonary:     Effort: Tachypnea present.   Skin:    General: Skin is warm and dry.   Neurological:     Mental Status: He is alert.   Psychiatric:        Mood and Affect: Mood is anxious.        Behavior: Behavior normal. Behavior is cooperative.            Vital Signs: BP 130/63 (BP Location: Left Wrist)   Pulse 73   Temp 98 F (36.7 C)   Resp 18   Ht 5' 11 (1.803 m)   Wt 79.8 kg   SpO2 100%   BMI 24.55 kg/m  SpO2: SpO2: 100 % O2 Device: O2 Device: Room Air O2 Flow Rate:        Palliative Assessment/Data: 20% to 30%   Palliative Care Assessment & Plan   Patient Profile: Per intake H&P --> Patient is a 83 year old male with history of paroxysmal A-fib, insulin -dependent diabetes, hypertension, hyperlipidemia, CVA with residual right-sided weakness, MGUS, GERD, BPH, hypothyroidism, HFrEF with EF of 40 to 45%, chronic pancytopenia who presented to the emergency department from SNF with complaint of shortness of breath, wound, pain in the sacral area.    Palliative care evaluated Earl [name] earlier this month and have been asked to get re-involved for additional goals of care conversations.   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 Continue Oxycodone  2.5mg  PO Q4H PRN for breakthrough pain  Continue sertraline  25mg  PO daily Continue Atarax  10 mg 3 times daily as needed for anxiety Psychosocial and emotional support provided PMT will continue to follow and support   Prognosis: Concerning  prognosis in light of recurrent hospitalizations, declining functional/nutritional status, and several chronic comorbidities  Discharge Planning: Skilled Nursing Facility for rehab with Palliative care service follow-up   Care plan was discussed with patient, patient's daughter, MD, PT   Earl Mcfarren SHAUNNA Fell, PA-C  Palliative Medicine Team Team phone # [phone]  Thank you for allowing the Palliative Medicine Team to assist in the care of this patient. Please utilize secure chat with additional questions, if there is no response within 30 minutes please call the above phone number.  Palliative Medicine Team providers are available by phone from 7am to 7pm daily and can be reached through the team cell phone.  Should this patient require assistance outside of these hours, please call the patient's attending physician.      Time Total: 35  Visit consisted of counseling and education dealing with the complex and emotionally intense issues of symptom management and palliative care in the setting of serious and potentially life-threatening illness. Greater than 50% of this time was spent counseling and coordinating care related to the above assessment and plan.  Personally spent 35 minutes in patient care including extensive chart review (labs, imaging, progress/consult notes, vital signs), medically appropraite exam, discussed with treatment team, education to patient, family, and staff, documenting clinical information, medication review and management, coordination of care, and available advanced directive documents.

## 2024-05-14 NOTE — Progress Notes (Signed)
 Occupational Therapy Treatment Patient Details Name: Earl [name] MRN: [id number] DOB: [birth year] Today's Date: 05/14/2024   History of present illness 83 y.o. male admitted from [facility] rehab on 03/04/24 with SOB, sacral pain, Afib with RVR. Pt with acute metabolic encephalopathy, rhabdomyolysis and sepsis secondary to sacral wound infection. 4/15 & 4/17 bedside sacral wound debridement. PMhx: adm 3/26-4/08/2024 with fall, AMS, COVID; d/c to SNF rehab. CVA (2021, residual R-side weakness), T2DM, HTN, CHF, MGUS, Afib on Eliquis .   OT comments  Unfortunately, pt was unable to show progress today with updated OT goals as evidenced by an decreased score on 6-Clicks AM-PAC measure of occupational Performance with previous score of 12/24, and current score of 11/24 indicating a higher need of assistance with bed level ADLs.  Patient remains limited by a resting RPE of 10/10 all the time, profound generalized weakness with RUE ataxia and limited ROM, and severely decreased activity tolerance with increased fatigue just speaking, along with deficits noted below. Pt continues to demonstrate fair/guarded rehab potential and could benefit from continued skilled OT and Caregiver training to increase safety and independence with ADLs and functional transfers to allow pt to discharge with increased safely and reduce caregiver burden and fall risk.        If plan is discharge home, recommend the following:  A lot of help with walking and/or transfers;A lot of help with bathing/dressing/bathroom;Assistance with cooking/housework;Assist for transportation;Help with stairs or ramp for entrance;Two people to help with walking and/or transfers;Assistance with feeding   Equipment Recommendations       Recommendations for Other Services      Precautions / Restrictions Precautions Precautions: Fall;Other (comment) Recall of Precautions/Restrictions: Impaired Precaution/Restrictions Comments:  sacral wound, rt lean Restrictions Weight Bearing Restrictions Per Provider Order: No       Mobility Bed Mobility                    Transfers                         Balance                                           ADL either performed or assessed with clinical judgement   ADL Overall ADL's : Needs assistance/impaired Eating/Feeding: Minimal assistance;Set up;Cueing for sequencing;With adaptive utensils;Bed level (Per RN, pt's daughter has been coming and spoon feeding pt. If we catch the daughter it would be beneficial for CG education to allow pt to do as much as possible and help only when needed.) Eating/Feeding Details (indicate cue type and reason): pt able to feed himself with R hand with spillage fromc offee, required assist to open all containers. Pt requires rest breaks in between each bite. Cues for slow and controlled movements. Environmental modifications including removal of meal dome bottom to lower plate for easier pt access. Pt also assisted with hands on either bed rail to help pull up trunk to allow OT to place pillow behind back to improve feeding position. Pt unable to clear his back from bed without Max As, but pt assisting. RN also oriented to red foam handle and expressed plan to notifiy night shift to allow pt to self-feed at dinner. Grooming: Wash/dry hands;Wash/dry face;Set up;Bed level;Cueing for sequencing;Cueing for compensatory techniques;Supervision/safety Grooming Details (indicate cue type and reason): RUE ataxia noted and pt  benefited from verbal cue for slow, controlled movements. Cues for pt to visually attend to task which led to significant improvement in RUE control and hand wash.                                    Extremity/Trunk Assessment Upper Extremity Assessment RUE Deficits / Details: impaired grip/grasp, and now unable to feed himself with R hand without spillage. Must use LT hand with  red grip. Noted RUE ataxia. RUE Coordination: decreased fine motor;decreased gross motor LUE Deficits / Details: Able to demostrate composite grip and now feeding self with LT hand with some spilling. LUE Coordination: decreased fine motor            Vision Patient Visual Report: No change from baseline Vision Assessment?: No apparent visual deficits Additional Comments: Finding food on plate   Perception     Praxis     Communication Communication Communication: Impaired Factors Affecting Communication: Reduced clarity of speech;Difficulty expressing self (Whispered voice and very SHOB with talking.)   Cognition Arousal: Alert Behavior During Therapy: Anxious               OT - Cognition Comments: follows simple commands with more time, slow to process.  Pt very easily fatigued from speaking.                 Following commands: Impaired Following commands impaired: Follows one step commands inconsistently, Follows one step commands with increased time      Cueing   Cueing Techniques: Verbal cues, Gestural cues, Tactile cues, Visual cues  Exercises Other Exercises Other Exercises: reclined BUE arm raise. RT then LT with cues for controlled movements. Pt only tolerated 1 rep of this each. Other Exercises: Pt reported RPE as 10/10 All the time. Pt reports he is always at max fatigue unless asleep.    Shoulder Instructions       General Comments      Pertinent Vitals/ Pain       Pain Assessment Pain Assessment: No/denies pain  Home Living                                          Prior Functioning/Environment              Frequency  Min 2X/week        Progress Toward Goals  OT Goals(current goals can now be found in the care plan section)  Progress towards OT goals: Not progressing toward goals - comment  Acute Rehab OT Goals Patient Stated Goal: Keep coming back. OT Goal Formulation: With patient Time For Goal  Achievement: 05/15/24 Potential to Achieve Goals: Fair  Plan      Co-evaluation                 AM-PAC OT 6 Clicks Daily Activity     Outcome Measure   Help from another person eating meals?: A Little Help from another person taking care of personal grooming?: A Little Help from another person toileting, which includes using toliet, bedpan, or urinal?: Total Help from another person bathing (including washing, rinsing, drying)?: Total Help from another person to put on and taking off regular upper body clothing?: A Lot Help from another person to put on and taking off regular lower body clothing?: Total 6 Click  Score: 11    End of Session Equipment Utilized During Treatment:  (red foam utensil handle)  OT Visit Diagnosis: Other abnormalities of gait and mobility (R26.89);Muscle weakness (generalized) (M62.81);History of falling (Z91.81);Feeding difficulties (R63.3);Ataxia, unspecified (R27.0)   Activity Tolerance Patient limited by fatigue   Patient Left in bed;with call bell/phone within reach;with nursing/sitter in room   Nurse Communication Other (comment) (Use of red foam handle. Teaching dtr to allow pt to feed himself ad lib to maintain strength.)        Time: 9168-9093 OT Time Calculation (min): 35 min  Charges: OT General Charges $OT Visit: 1 Visit OT Treatments $Self Care/Home Management : 8-22 mins $Therapeutic Activity: 8-22 mins  Delon, OT Acute Rehab Services Office: [phone] 05/14/2024   Delon Falter 05/14/2024, 9:20 AM

## 2024-05-15 ENCOUNTER — Encounter (INDEPENDENT_AMBULATORY_CARE_PROVIDER_SITE_OTHER): Payer: Self-pay

## 2024-05-15 ENCOUNTER — Other Ambulatory Visit: Payer: Self-pay

## 2024-05-15 DIAGNOSIS — R6520 Severe sepsis without septic shock: Secondary | ICD-10-CM | POA: Diagnosis not present

## 2024-05-15 DIAGNOSIS — A419 Sepsis, unspecified organism: Secondary | ICD-10-CM | POA: Diagnosis not present

## 2024-05-15 LAB — CBC
HCT: 34.5 % — ABNORMAL LOW (ref 39.0–52.0)
Hemoglobin: 11.3 g/dL — ABNORMAL LOW (ref 13.0–17.0)
MCH: 28.7 pg (ref 26.0–34.0)
MCHC: 32.8 g/dL (ref 30.0–36.0)
MCV: 87.6 fL (ref 80.0–100.0)
Platelets: 241 10*3/uL (ref 150–400)
RBC: 3.94 MIL/uL — ABNORMAL LOW (ref 4.22–5.81)
RDW: 18.3 % — ABNORMAL HIGH (ref 11.5–15.5)
WBC: 5.7 10*3/uL (ref 4.0–10.5)
nRBC: 0 % (ref 0.0–0.2)

## 2024-05-15 LAB — COMPREHENSIVE METABOLIC PANEL WITH GFR
ALT: 80 U/L — ABNORMAL HIGH (ref 0–44)
AST: 60 U/L — ABNORMAL HIGH (ref 15–41)
Albumin: 3.1 g/dL — ABNORMAL LOW (ref 3.5–5.0)
Alkaline Phosphatase: 155 U/L — ABNORMAL HIGH (ref 38–126)
Anion gap: 12 (ref 5–15)
BUN: 32 mg/dL — ABNORMAL HIGH (ref 8–23)
CO2: 24 mmol/L (ref 22–32)
Calcium: 9 mg/dL (ref 8.9–10.3)
Chloride: 109 mmol/L (ref 98–111)
Creatinine, Ser: 0.96 mg/dL (ref 0.61–1.24)
GFR, Estimated: >60 mL/min (ref >60)
Glucose, Bld: 161 mg/dL — ABNORMAL HIGH (ref 70–99)
Potassium: 3.4 mmol/L — ABNORMAL LOW (ref 3.5–5.1)
Sodium: 145 mmol/L (ref 135–145)
Total Bilirubin: 1.2 mg/dL (ref 0.0–1.2)
Total Protein: 6.2 g/dL — ABNORMAL LOW (ref 6.5–8.1)

## 2024-05-15 LAB — MAGNESIUM: Magnesium: 1.9 mg/dL (ref 1.7–2.4)

## 2024-05-15 LAB — GLUCOSE, CAPILLARY
Glucose-Capillary: 136 mg/dL — ABNORMAL HIGH (ref 70–99)
Glucose-Capillary: 188 mg/dL — ABNORMAL HIGH (ref 70–99)
Glucose-Capillary: 144 mg/dL — ABNORMAL HIGH (ref 70–99)
Glucose-Capillary: 154 mg/dL — ABNORMAL HIGH (ref 70–99)

## 2024-05-15 LAB — PHOSPHORUS: Phosphorus: 3.5 mg/dL (ref 2.5–4.6)

## 2024-05-15 MED ORDER — VITAMIN A 3 MG (10000 UNIT) PO CAPS
10000.0000 [IU] | ORAL_CAPSULE | Freq: Every day | ORAL | Status: DC
  Administered 2024-05-15 – 2024-06-10 (×27): 10000 [IU] via ORAL
  Filled 2024-05-15 (×27): qty 1

## 2024-05-15 MED ORDER — ENSURE ENLIVE PO LIQD: 237.0000 mL | Freq: Three times a day (TID) | ORAL | Status: DC

## 2024-05-15 MED ORDER — ENSURE ENLIVE PO LIQD
237.0000 mL | Freq: Two times a day (BID) | ORAL | Status: DC
  Administered 2024-05-15 – 2024-06-02 (×32): 237 mL via ORAL
  Filled 2024-05-15 (×19): qty 237

## 2024-05-15 MED ORDER — VITAMIN C 500 MG PO TABS
500.0000 mg | ORAL_TABLET | Freq: Two times a day (BID) | ORAL | Status: DC
  Administered 2024-05-15 – 2024-06-10 (×53): 500 mg via ORAL
  Filled 2024-05-15 (×53): qty 1

## 2024-05-15 MED ORDER — ALBUMIN HUMAN 25 % IV SOLN
25.0000 g | Freq: Once | INTRAVENOUS | Status: AC
  Administered 2024-05-15: 25 g via INTRAVENOUS
  Filled 2024-05-15 (×2): qty 100

## 2024-05-15 MED ORDER — ZINC SULFATE 220 (50 ZN) MG PO CAPS
220.0000 mg | ORAL_CAPSULE | Freq: Every day | ORAL | Status: AC
  Administered 2024-05-15 – 2024-05-28 (×14): 220 mg via ORAL
  Filled 2024-05-15 (×14): qty 1

## 2024-05-15 MED ORDER — DAKINS (1/4 STRENGTH) 0.125 % EX SOLN
Freq: Every day | CUTANEOUS | Status: AC
  Filled 2024-05-15: qty 473

## 2024-05-15 MED ORDER — DAKINS (1/4 STRENGTH) 0.125 % EX SOLN: Freq: Every day | CUTANEOUS | Status: DC

## 2024-05-15 NOTE — Plan of Care (Signed)
  Problem: Fluid Volume: Goal: Hemodynamic stability will improve Outcome: Progressing   Problem: Clinical Measurements: Goal: Diagnostic test results will improve Outcome: Progressing Goal: Signs and symptoms of infection will decrease Outcome: Progressing   Problem: Respiratory: Goal: Ability to maintain adequate ventilation will improve Outcome: Progressing   Problem: Education: Goal: Knowledge of risk factors and measures for prevention of condition will improve Outcome: Progressing   Problem: Coping: Goal: Psychosocial and spiritual needs will be supported Outcome: Progressing   Problem: Respiratory: Goal: Will maintain a patent airway Outcome: Progressing Goal: Complications related to the disease process, condition or treatment will be avoided or minimized Outcome: Progressing   Problem: Education: Goal: Ability to describe self-care measures that may prevent or decrease complications (Diabetes Survival Skills Education) will improve Outcome: Progressing   Problem: Coping: Goal: Ability to adjust to condition or change in health will improve Outcome: Progressing   Problem: Fluid Volume: Goal: Ability to maintain a balanced intake and output will improve Outcome: Progressing   Problem: Health Behavior/Discharge Planning: Goal: Ability to manage health-related needs will improve Outcome: Progressing   Problem: Metabolic: Goal: Ability to maintain appropriate glucose levels will improve Outcome: Progressing   Problem: Nutritional: Goal: Maintenance of adequate nutrition will improve Outcome: Progressing Goal: Progress toward achieving an optimal weight will improve Outcome: Progressing   Problem: Skin Integrity: Goal: Risk for impaired skin integrity will decrease Outcome: Progressing   Problem: Tissue Perfusion: Goal: Adequacy of tissue perfusion will improve Outcome: Progressing   Problem: Education: Goal: Knowledge of General Education information  will improve Description: Including pain rating scale, medication(s)/side effects and non-pharmacologic comfort measures Outcome: Progressing   Problem: Health Behavior/Discharge Planning: Goal: Ability to manage health-related needs will improve Outcome: Progressing   Problem: Clinical Measurements: Goal: Ability to maintain clinical measurements within normal limits will improve Outcome: Progressing Goal: Will remain free from infection Outcome: Progressing Goal: Diagnostic test results will improve Outcome: Progressing Goal: Respiratory complications will improve Outcome: Progressing Goal: Cardiovascular complication will be avoided Outcome: Progressing   Problem: Activity: Goal: Risk for activity intolerance will decrease Outcome: Progressing   Problem: Nutrition: Goal: Adequate nutrition will be maintained Outcome: Progressing   Problem: Coping: Goal: Level of anxiety will decrease Outcome: Progressing   Problem: Elimination: Goal: Will not experience complications related to bowel motility Outcome: Progressing Goal: Will not experience complications related to urinary retention Outcome: Progressing   Problem: Pain Managment: Goal: General experience of comfort will improve and/or be controlled Outcome: Progressing   Problem: Safety: Goal: Ability to remain free from injury will improve Outcome: Progressing   Problem: Skin Integrity: Goal: Risk for impaired skin integrity will decrease Outcome: Progressing

## 2024-05-15 NOTE — Consult Note (Addendum)
 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: unstageable R heel unstageable  L heel DTPI L medial foot: stage 3  Measurement: Left hip: 5 cm x 3 cm x 1.5 cm Sacral: 6 cm x 3 cm x 0.5 cm with undermining from 12 o'clock to 3 o'clock measuring 3 cm  L shoulder: 2 cm x 3 cm R heel: 4 cm x 3 cm  L heel: DTPI L medial foot: 2 cm x 0.3 cm x 0.2 cm  Wound bed:  Left hip:100% covered with thick yellow adherent slough, drainage is purulent with green/blue tint, no odor Sacrum: 90% clean red moist tissue in wound bed, 10% soft yellow slough at upper edge of wound, drainage is purulent with green/blue tint, no odor L shoulder:90% wound bed covered with slough, slough feels hard and attached to wound bed with edges lifting. 10% pink tissue surrounding slough on all sides  R heel: 90% pink moist tissue with 10% yellow slough concentrated in center of wound  L heel:hard black eschar  L medial foot: 100% red tissue  Periwound: intact  Dressing procedure/placement/frequency: 1 - Left Hip PI unstageable Clean with NS, allow to air dry, apply Dakin's soaked gauze and then cover with foam dressing, change daily for 5 days.   2 - Left shoulder DTPI Clean with Vashe ( lawson # [id number]), allow to air dry, apply santyl  to wound bed, cover with saline soaked gauze, and then cover with foam dressing, change daily   3 - Sacral Unstageable PI Cleanse with Vashe#[id number]. Apply Aquacel Ag+ (silver hydrofiber) Lawson # [phone] in the wound bed and fill the undermining using a swab, cover with sacral foam dressing, change daily and PRN soilage   R heel:  Clean with Vashe ( lawson # [id number]), allow to air dry, apply santyl  to wound bed, cover with saline soaked gauze, and then cover with foam dressing, change daily   L heel:  Cover with foam dressing, change daily   L medial foot: Cover with foam dressing, change daily   Both feet should be placed in Prevalon  boots to offload pressure Soila [phone])   Daughter at bedside at time of visit, plan of care discussed.  Discussed changes in wound care orders and return back to Aquacel for the sacral wound, given the dimensions and contours of the wound Aquacel will provide better packing capability as gauze was observed to dislodge post packing with minimal movement.  Bedside nurse updated regarding changes in wound care orders.  WOC team will continue to follow patient weekly, please re-consult if new needs arise.   Doyal Polite, RN, MSN, [facility] WOC Team

## 2024-05-15 NOTE — Progress Notes (Signed)
 Specialty Pharmacy Refill Coordination Note  Earl [name] is a 83 y.o. male contacted today regarding refills of specialty medication(s) Tafamidis    Patient requested (Proxy-Rptd) Delivery   Delivery date: 05/17/24   Verified address: (Proxy-Rptd) [address] [place] [address]   Medication will be filled on 06.26.25.

## 2024-05-15 NOTE — Progress Notes (Signed)
 PROGRESS NOTE    Earl [name]  FMW:[id number] DOB: 10/23/1941 DOA: 03/04/2024 PCP: [name], MD   Brief Narrative:  83 year old male with history of PAF on Eliquis , IDDM 2, HTN, CVA with right-sided residual weakness, MGUS, BPH, systolic CHF who is here with sacral pain. Of note, he was hospitalized 3/26-4/10 with generalized weakness/acute encephalopathy, hypotension, and was COVID PCR positive. He was eventually discharged to SNF and readmitted 4 days later with pain in the sacral area and shortness of breath. He was found to be in A-fib with RVR, tachypneic and was hypotensive on admission for which critical care was consulted. General surgery was consulted for sacral wound debridement status post bedside excision 4/15 and 03/07/2024.   Assessment & Plan:   Principal Problem:   Severe sepsis (HCC) Active Problems:   Paroxysmal atrial fibrillation (HCC)   Sepsis (HCC)   Atrial flutter (HCC)   Sacral ulcer (HCC)   MGUS (monoclonal gammopathy of unknown significance)   Essential hypertension   Hyperlipidemia   Insulin  dependent type 2 diabetes mellitus (HCC)   History of CVA (cerebrovascular accident)   Cardiac amyloidosis (HCC)   Chronic systolic CHF (congestive heart failure) (HCC)   GERD (gastroesophageal reflux disease)   History of hypertension   Atrial fibrillation with RVR (HCC)   Community acquired pneumonia   Protein-calorie malnutrition, severe   Wild-type transthyretin-related (ATTR) amyloidosis (HCC)  Stage IV sacral decubitus wound/ulcer-repeat CT scan on 6/16 with some cortical irregularity along the underlying dorsal mid sacrum, concerning for osteomyelitis, findings appearing to be new.  Infectious disease MD reconsulted and Dr. Dea reviewed the case, did not recommend antibiotics but wound care alone, with plans for antibiotics if and when signs of infection - Dr. Waddell with plastic surgery evaluated patient 6/17, appears that he has a clean  sacral decubitus, not a candidate for any type of flap closure.  Per his note, patient nor daughter were interested in surgery at this time.  Plastic surgery also recommends wound care - He most recently finished Fortaz  on 6/13, most recent wound culture done on 6/4 showed Pseudomonas and Klebsiella - Wound care reevaluated, discussed with PT, have been using Aquacel but it appears that on most recent dressing change it appears that it was not covering certain areas, discussed with wound care and change to Vashe, reevaluate this week, appreciate wound care follow-up   Severe sepsis-on admission, patient had lactic acidosis, white count, fever secondary to infected chronic wound.  He has received multiple antibiotics during his long hospitalization.  Sepsis physiology has now resolved and he is currently afebrile, normotensive, not tachycardic.  White count is now stable   Acute on chronic pain-secondary to above, continue pain regimen with oxycodone  as below   Acute on chronic combined CHF, cardiac amyloidosis, nonischemic cardiomyopathy -on tafamidis  and vutrisiran  for amyloid.  Goal-directed medical therapy somewhat limited by prior episodes of hypotension.  Currently there is evidence that he is having anasarca/fluid overload, has been placed on furosemide , continue.  Will repeat albumin  today.   PAF-continue amiodarone  and Eliquis    Anemia of chronic disease-received a unit of packed red blood cells on 4/30.  Hemoglobin has remained stable, no bleeding   Elevated LFTs-mild, no nausea, no vomiting, no right upper quadrant pain.  Probably due to receiving Tylenol  around-the-clock, discussed with the bedside RN to hold Tylenol  dosing today   Dysphagia/severe protein calorie malnutrition-continue supplementation, pured diet   Physical debilitation, muscle weakness-PT/OT ongoing   Goals of care -palliative consulted as well,  appears to be DNR.  Daughter wants him to transition at home once his  wounds are better   IDDM -continue Semglee , sliding scale, Accu-Cheks  DVT prophylaxis: SCDs Start: 03/04/24 2151 Place TED hose Start: 03/04/24 2151   Code Status: Limited: Do not attempt resuscitation (DNR) -DNR-LIMITED -Do Not Intubate/DNI   Family Communication:  None present at bedside.   Status is: Inpatient Remains inpatient appropriate because: Sacral wound   Estimated body mass index is 24.55 kg/m as calculated from the following:   Height as of this encounter: 5' 11 (1.803 m).   Weight as of this encounter: 79.8 kg.  Pressure Injury 02/26/24 Coccyx Mid Stage 4 - Full thickness tissue loss with exposed bone, tendon or muscle. (Active)  02/26/24 0916  Location: Coccyx  Location Orientation: Mid  Staging: Stage 4 - Full thickness tissue loss with exposed bone, tendon or muscle.  Wound Description (Comments):   DO NOT USE:  Present on Admission: Yes  Dressing Type Foam - Lift dressing to assess site every shift 05/14/24 0917     Pressure Injury 02/26/24 Toe (Comment  which one) Anterior;Right Deep Tissue Pressure Injury - Purple or maroon localized area of discolored intact skin or blood-filled blister due to damage of underlying soft tissue from pressure and/or shear. right gre (Active)  02/26/24 1215  Location: Toe (Comment  which one)  Location Orientation: Anterior;Right  Staging: Deep Tissue Pressure Injury - Purple or maroon localized area of discolored intact skin or blood-filled blister due to damage of underlying soft tissue from pressure and/or shear.  Wound Description (Comments): right great toe  DO NOT USE:  Present on Admission:   Dressing Type None 05/08/24 1500     Pressure Injury 03/06/24 Hip Anterior;Left;Proximal Unstageable - Full thickness tissue loss in which the base of the injury is covered by slough (yellow, tan, gray, green or brown) and/or eschar (tan, brown or black) in the wound bed. unstageable (Active)  03/06/24 1612  Location: Hip   Location Orientation: Anterior;Left;Proximal  Staging: Unstageable - Full thickness tissue loss in which the base of the injury is covered by slough (yellow, tan, gray, green or brown) and/or eschar (tan, brown or black) in the wound bed.  Wound Description (Comments): unstageable  DO NOT USE:  Present on Admission: No  Dressing Type Foam - Lift dressing to assess site every shift;Gauze (Comment);Santyl ;Barrier Film (skin prep);Moist to moist 05/14/24 1432     Pressure Injury 03/06/24 Shoulder Left Unstageable - Full thickness tissue loss in which the base of the injury is covered by slough (yellow, tan, gray, green or brown) and/or eschar (tan, brown or black) in the wound bed. unstageable (Active)  03/06/24 1613  Location: Shoulder  Location Orientation: Left  Staging: Unstageable - Full thickness tissue loss in which the base of the injury is covered by slough (yellow, tan, gray, green or brown) and/or eschar (tan, brown or black) in the wound bed.  Wound Description (Comments): unstageable  DO NOT USE:  Present on Admission: No  Dressing Type Foam - Lift dressing to assess site every shift 05/14/24 0917     Pressure Injury 04/23/24 Heel Right Unstageable - Full thickness tissue loss in which the base of the injury is covered by slough (yellow, tan, gray, green or brown) and/or eschar (tan, brown or black) in the wound bed. DTPI evolving into unstageable (Active)  04/23/24   Location: Heel  Location Orientation: Right  Staging: Unstageable - Full thickness tissue loss in which the base  of the injury is covered by slough (yellow, tan, gray, green or brown) and/or eschar (tan, brown or black) in the wound bed.  Wound Description (Comments): DTPI evolving into unstageable  DO NOT USE:  Present on Admission: No  Dressing Type Foam - Lift dressing to assess site every shift 05/14/24 0917     Pressure Injury 04/23/24 Heel Left Deep Tissue Pressure Injury - Purple or maroon localized area of  discolored intact skin or blood-filled blister due to damage of underlying soft tissue from pressure and/or shear. (Active)  04/23/24   Location: Heel  Location Orientation: Left  Staging: Deep Tissue Pressure Injury - Purple or maroon localized area of discolored intact skin or blood-filled blister due to damage of underlying soft tissue from pressure and/or shear.  Wound Description (Comments):   DO NOT USE:  Present on Admission: No  Dressing Type Foam - Lift dressing to assess site every shift 05/14/24 0917   Nutritional Assessment: Body mass index is 24.55 kg/m.SABRA Seen by dietician.  I agree with the assessment and plan as outlined below: Nutrition Status: Nutrition Problem: Severe Malnutrition Etiology: chronic illness Signs/Symptoms: energy intake < or equal to 75% for > or equal to 1 month, severe muscle depletion Interventions: Ensure Enlive (each supplement provides 350kcal and 20 grams of protein), Magic cup, Hormel Shake, MVI, Juven, Other (Comment) (vitamin C )  . Skin Assessment: I have examined the patient's skin and I agree with the wound assessment as performed by the wound care RN as outlined below: Pressure Injury 02/26/24 Coccyx Mid Stage 4 - Full thickness tissue loss with exposed bone, tendon or muscle. (Active)  02/26/24 0916  Location: Coccyx  Location Orientation: Mid  Staging: Stage 4 - Full thickness tissue loss with exposed bone, tendon or muscle.  Wound Description (Comments):   DO NOT USE:  Present on Admission: Yes  Dressing Type Foam - Lift dressing to assess site every shift 05/14/24 0917     Pressure Injury 02/26/24 Toe (Comment  which one) Anterior;Right Deep Tissue Pressure Injury - Purple or maroon localized area of discolored intact skin or blood-filled blister due to damage of underlying soft tissue from pressure and/or shear. right gre (Active)  02/26/24 1215  Location: Toe (Comment  which one)  Location Orientation: Anterior;Right  Staging:  Deep Tissue Pressure Injury - Purple or maroon localized area of discolored intact skin or blood-filled blister due to damage of underlying soft tissue from pressure and/or shear.  Wound Description (Comments): right great toe  DO NOT USE:  Present on Admission:   Dressing Type None 05/08/24 1500     Pressure Injury 03/06/24 Hip Anterior;Left;Proximal Unstageable - Full thickness tissue loss in which the base of the injury is covered by slough (yellow, tan, gray, green or brown) and/or eschar (tan, brown or black) in the wound bed. unstageable (Active)  03/06/24 1612  Location: Hip  Location Orientation: Anterior;Left;Proximal  Staging: Unstageable - Full thickness tissue loss in which the base of the injury is covered by slough (yellow, tan, gray, green or brown) and/or eschar (tan, brown or black) in the wound bed.  Wound Description (Comments): unstageable  DO NOT USE:  Present on Admission: No  Dressing Type Foam - Lift dressing to assess site every shift;Gauze (Comment);Santyl ;Barrier Film (skin prep);Moist to moist 05/14/24 1432     Pressure Injury 03/06/24 Shoulder Left Unstageable - Full thickness tissue loss in which the base of the injury is covered by slough (yellow, tan, gray, green or brown) and/or  eschar (tan, brown or black) in the wound bed. unstageable (Active)  03/06/24 1613  Location: Shoulder  Location Orientation: Left  Staging: Unstageable - Full thickness tissue loss in which the base of the injury is covered by slough (yellow, tan, gray, green or brown) and/or eschar (tan, brown or black) in the wound bed.  Wound Description (Comments): unstageable  DO NOT USE:  Present on Admission: No  Dressing Type Foam - Lift dressing to assess site every shift 05/14/24 0917     Pressure Injury 04/23/24 Heel Right Unstageable - Full thickness tissue loss in which the base of the injury is covered by slough (yellow, tan, gray, green or brown) and/or eschar (tan, brown or black) in  the wound bed. DTPI evolving into unstageable (Active)  04/23/24   Location: Heel  Location Orientation: Right  Staging: Unstageable - Full thickness tissue loss in which the base of the injury is covered by slough (yellow, tan, gray, green or brown) and/or eschar (tan, brown or black) in the wound bed.  Wound Description (Comments): DTPI evolving into unstageable  DO NOT USE:  Present on Admission: No  Dressing Type Foam - Lift dressing to assess site every shift 05/14/24 0917     Pressure Injury 04/23/24 Heel Left Deep Tissue Pressure Injury - Purple or maroon localized area of discolored intact skin or blood-filled blister due to damage of underlying soft tissue from pressure and/or shear. (Active)  04/23/24   Location: Heel  Location Orientation: Left  Staging: Deep Tissue Pressure Injury - Purple or maroon localized area of discolored intact skin or blood-filled blister due to damage of underlying soft tissue from pressure and/or shear.  Wound Description (Comments):   DO NOT USE:  Present on Admission: No  Dressing Type Foam - Lift dressing to assess site every shift 05/14/24 0917    Consultants:  Plastic surgery, wound care, general surgery  Procedures:  As above  Antimicrobials:  Anti-infectives (From admission, onward)    Start     Dose/Rate Route Frequency Ordered Stop   04/27/24 1615  cefTAZidime  (FORTAZ ) 2 g in sodium chloride  0.9 % 100 mL IVPB        2 g 200 mL/hr over 30 Minutes Intravenous Every 8 hours 04/27/24 1518 05/03/24 2359   03/16/24 1015  cefTRIAXone  (ROCEPHIN ) 2 g in sodium chloride  0.9 % 100 mL IVPB        2 g 200 mL/hr over 30 Minutes Intravenous Every 24 hours 03/16/24 0929 03/20/24 1219   03/16/24 1015  doxycycline  (VIBRA -TABS) tablet 100 mg        100 mg Oral Every 12 hours 03/16/24 0929 03/20/24 2131   03/14/24 1000  metroNIDAZOLE  (FLAGYL ) tablet 500 mg        500 mg Oral Every 12 hours 03/14/24 0803 03/19/24 2219   03/13/24 1000  cefadroxil   (DURICEF) capsule 1,000 mg  Status:  Discontinued        1,000 mg Oral 2 times daily 03/12/24 1632 03/16/24 0929   03/07/24 2200  metroNIDAZOLE  (FLAGYL ) tablet 500 mg        500 mg Oral Every 12 hours 03/07/24 0840 03/11/24 2258   03/05/24 1930  vancomycin  (VANCOREADY) IVPB 1250 mg/250 mL  Status:  Discontinued        1,250 mg 166.7 mL/hr over 90 Minutes Intravenous Every 24 hours 03/04/24 2205 03/04/24 2224   03/05/24 1930  Vancomycin  (VANCOCIN ) 1,250 mg in sodium chloride  0.9 % 250 mL IVPB  Status:  Discontinued  1,250 mg 166.7 mL/hr over 90 Minutes Intravenous Every 24 hours 03/04/24 2224 03/05/24 0717   03/05/24 1930  vancomycin  (VANCOREADY) IVPB 1500 mg/300 mL  Status:  Discontinued        1,500 mg 150 mL/hr over 120 Minutes Intravenous Every 24 hours 03/05/24 0717 03/12/24 1636   03/05/24 1400  ceFEPIme  (MAXIPIME ) 2 g in sodium chloride  0.9 % 100 mL IVPB  Status:  Discontinued        2 g 200 mL/hr over 30 Minutes Intravenous Every 8 hours 03/05/24 0717 03/12/24 1632   03/05/24 0600  ceFEPIme  (MAXIPIME ) 2 g in sodium chloride  0.9 % 100 mL IVPB  Status:  Discontinued        2 g 200 mL/hr over 30 Minutes Intravenous Every 12 hours 03/04/24 2205 03/05/24 0717   03/05/24 0000  metroNIDAZOLE  (FLAGYL ) IVPB 500 mg  Status:  Discontinued        500 mg 100 mL/hr over 60 Minutes Intravenous Every 12 hours 03/04/24 2206 03/04/24 2209   03/04/24 1730  aztreonam (AZACTAM) 2 g in sodium chloride  0.9 % 100 mL IVPB  Status:  Discontinued        2 g 200 mL/hr over 30 Minutes Intravenous  Once 03/04/24 1715 03/04/24 1724   03/04/24 1730  metroNIDAZOLE  (FLAGYL ) IVPB 500 mg  Status:  Discontinued        500 mg 100 mL/hr over 60 Minutes Intravenous Every 12 hours 03/04/24 1715 03/07/24 0840   03/04/24 1730  vancomycin  (VANCOREADY) IVPB 1500 mg/300 mL        1,500 mg 150 mL/hr over 120 Minutes Intravenous  Once 03/04/24 1715 03/04/24 2153   03/04/24 1730  ceFEPIme  (MAXIPIME ) 2 g in sodium  chloride 0.9 % 100 mL IVPB        2 g 200 mL/hr over 30 Minutes Intravenous  Once 03/04/24 1724 03/04/24 1826         Subjective: Patient seen and examined.  Only complaint is headache.  No other complaint.  He is fully alert and oriented but appears weak.  He has Tylenol  ordered.  Objective: Vitals:   05/14/24 1626 05/14/24 2135 05/15/24 0001 05/15/24 0353  BP: (!) 138/91 124/81 126/84 135/73  Pulse: 84 76 71 79  Resp: 17 18 18 18   Temp: (!) 97 F (36.1 C) (!) 97.5 F (36.4 C) 97.7 F (36.5 C) (!) 97.5 F (36.4 C)  TempSrc:      SpO2: 97% 99% 99% 99%  Weight:      Height:        Intake/Output Summary (Last 24 hours) at 05/15/2024 0749 Last data filed at 05/14/2024 2000 Gross per 24 hour  Intake 1250 ml  Output 3400 ml  Net -2150 ml   Filed Weights   05/07/24 0500 05/09/24 0607 05/13/24 0513  Weight: 80.1 kg 79.8 kg 79.8 kg    Examination:  General exam: Appears calm and comfortable  Respiratory system: Clear to auscultation. Respiratory effort normal. Cardiovascular system: S1 & S2 heard, RRR. No JVD, murmurs, rubs, gallops or clicks. No pedal edema. Gastrointestinal system: Abdomen is nondistended, soft and nontender. No organomegaly or masses felt. Normal bowel sounds heard. Central nervous system: Alert and oriented. No focal neurological deficits. Extremities: Symmetric 5 x 5 power. Skin: No rashes, lesions or ulcers Psychiatry: Judgement and insight appear normal. Mood & affect appropriate.    Data Reviewed: I have personally reviewed following labs and imaging studies  CBC: Recent Labs  Lab 05/09/24 0349 05/11/24 0606  05/13/24 0937  WBC 6.7 5.0 5.4  HGB 11.2* 11.0* 11.5*  HCT 35.5* 33.9* 35.8*  MCV 88.1 88.1 88.8  PLT 229 215 244   Basic Metabolic Panel: Recent Labs  Lab 05/09/24 0349 05/11/24 0606 05/13/24 0937  NA 141 143 143  K 3.8 3.3* 3.9  CL 108 109 104  CO2 26 26 23   GLUCOSE 138* 69* 184*  BUN 33* 38* 35*  CREATININE 0.91 0.95  1.00  CALCIUM  8.6* 8.6* 8.9  MG 1.9 1.8 2.1   GFR: Estimated Creatinine Clearance: 59.6 mL/min (by C-G formula based on SCr of 1 mg/dL). Liver Function Tests: Recent Labs  Lab 05/09/24 0349 05/11/24 0606 05/13/24 0937  AST 68* 54* 94*  ALT 61* 62* 123*  ALKPHOS 133* 133* 179*  BILITOT 1.2 0.9 1.2  PROT 6.0* 5.7* 6.3*  ALBUMIN  2.8* 2.6* 3.2*   No results for input(s): LIPASE, AMYLASE in the last 168 hours. No results for input(s): AMMONIA in the last 168 hours. Coagulation Profile: No results for input(s): INR, PROTIME in the last 168 hours. Cardiac Enzymes: No results for input(s): CKTOTAL, CKMB, CKMBINDEX, TROPONINI in the last 168 hours. BNP (last 3 results) No results for input(s): PROBNP in the last 8760 hours. HbA1C: No results for input(s): HGBA1C in the last 72 hours. CBG: Recent Labs  Lab 05/13/24 1953 05/14/24 0830 05/14/24 1209 05/14/24 1626 05/14/24 2137  GLUCAP 203* 139* 142* 154* 150*   Lipid Profile: No results for input(s): CHOL, HDL, LDLCALC, TRIG, CHOLHDL, LDLDIRECT in the last 72 hours. Thyroid  Function Tests: No results for input(s): TSH, T4TOTAL, FREET4, T3FREE, THYROIDAB in the last 72 hours. Anemia Panel: No results for input(s): VITAMINB12, FOLATE, FERRITIN, TIBC, IRON, RETICCTPCT in the last 72 hours. Sepsis Labs: No results for input(s): PROCALCITON, LATICACIDVEN in the last 168 hours.  Recent Results (from the past 240 hours)  Body fluid culture w Gram Stain     Status: None   Collection Time: 05/08/24  1:50 PM   Specimen: Lung, Left; Pleural Fluid  Result Value Ref Range Status   Specimen Description FLUID  Final   Special Requests NONE  Final   Gram Stain NO WBC SEEN NO ORGANISMS SEEN   Final   Culture   Final    NO GROWTH 3 DAYS Performed at [facility], [address] [place], [place] [address]    Report Status 05/11/2024 FINAL  Final     Radiology  Studies: No results found.  Scheduled Meds:  acetaminophen   500 mg Oral TID   amiodarone   200 mg Oral Daily   apixaban   5 mg Oral BID   atorvastatin   40 mg Oral Daily   Chlorhexidine  Gluconate Cloth  6 each Topical Daily   collagenase    Topical Daily   diclofenac  Sodium  4 g Topical QID   feeding supplement  237 mL Oral BID BM   ferrous sulfate   325 mg Oral QHS   furosemide   40 mg Intravenous BID AC & HS   guaiFENesin   600 mg Oral BID   insulin  aspart  0-5 Units Subcutaneous QHS   insulin  aspart  0-9 Units Subcutaneous TID WC   insulin  glargine-yfgn  8 Units Subcutaneous Daily   levothyroxine   50 mcg Oral Q0600   liver oil-zinc  oxide   Topical BID   multivitamin with minerals  1 tablet Oral Daily   nutrition supplement (JUVEN)  1 packet Oral BID BM   mouth rinse  15 mL Mouth Rinse 4  times per day   oxyCODONE   5 mg Oral QHS   phosphorus  500 mg Oral TID   polyethylene glycol  17 g Oral BID   senna-docusate  1 tablet Oral QHS   sertraline   25 mg Oral Daily   sodium chloride  flush  10-40 mL Intracatheter Q12H   Tafamidis   1 capsule Oral Daily   vutrisiran  sodium  25 mg Subcutaneous Q90 days   Continuous Infusions:  dextrose  Stopped (05/01/24 0003)     LOS: 72 days   Fredia Skeeter, MD [facility]  05/15/2024, 7:49 AM   *Please note that this is a verbal dictation therefore any spelling or grammatical errors are due to the Dragon Medical One system interpretation.  Please page via Amion and do not message via secure chat for urgent patient care matters. Secure chat can be used for non urgent patient care matters.  How to contact the [facility] Attending or Consulting provider 7A - 7P or covering provider during after hours 7P -7A, for this patient?  Check the care team in [facility] and look for a) attending/consulting [facility] provider listed and b) the [facility] team listed. Page or secure chat 7A-7P. Log into [email or web address] and use [place]'s universal password to access. If you do not  have the password, please contact the hospital operator. Locate the [facility] provider you are looking for under [facility] and page to a number that you can be directly reached. If you still have difficulty reaching the provider, please page the [facility] (Director on Call) for the Hospitalists listed on amion for assistance.

## 2024-05-15 NOTE — Progress Notes (Addendum)
 Nutrition Follow-up  DOCUMENTATION CODES:   Severe malnutrition in context of chronic illness  INTERVENTION:   Continue to encourage optimal nutritional intake Continue Magic cup TID with meals, each supplement provides 290 kcal and 9 grams of protein Continue Ensure Enlive po BID-One in the Afternoon and one in the evening, each supplement provides 350 kcal and 20 grams of protein. Continue vanilla Greek yogurt to evening meal tray  Continue Juven BID to support wound healing Continue MVI with minerals daily 500 mg vitamin C  BID 220 mg zinc  sulfate daily x 14 days 10,000 units vitamin A  daily x 30 days for supplementation (7.6 (03/06/24)).  NUTRITION DIAGNOSIS:   Severe Malnutrition related to chronic illness as evidenced by energy intake < or equal to 75% for > or equal to 1 month, severe muscle depletion.  Ongoing  GOAL:   Patient will meet greater than or equal to 90% of their needs  Progressing   MONITOR:   PO intake, Supplement acceptance, Weight trends, Skin  REASON FOR ASSESSMENT:   Consult Assessment of nutrition requirement/status  ASSESSMENT:   83 year old male who presented to the ED on 03/04/24 from [facility] due to sacral pain. PMH of T2DM, HTN, HLD, CHF, atrial fibrillation, stroke with right-sided weakness, GERD, BPH, hypothyroidism, MGUS, transthyretin associated amyloid cardiomyopathy, chronic pancytopenia, recent hospital admission for COVID-19. Pt admitted with sepsis secondary to sacral pressure injury infection.  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 Attempted to speak with pt via call to hospital room phone, however, unable to reach.   Case discussed with RN, who reports pt consuming meals and supplements well.   Per [facility] note on 05/10/24, pt with unstageable pressure injuries to lt hip, lt shoulder, rt heel, and lt medial foot; stage 4 pressure injury to sacrum; and DPTI to lt heel.   Noted most recent hydrotherapy session was 05/14/24.  Pt remains on a dysphagia 1 diet with thin liquids. Noted meal completions 25-75%. Pt is complaint with all supplements.  Pt daughter motivated to improve wound healing. RD will add vitamins to aid in wound healing. Noted vitamin A  level drawn, however, not supplemented. RD will supplements in attempt to promote wound healing.    Wt has been stable over the past month.   Medications reviewed and include ferrous sulfate , lasix , phosphorus, and senokot  Labs reviewed: CBGS: 121-203 (inpatient orders for glycemic control are 0-5 units insulin  aspart daily at bedtime, 0-9 units insulin  aspart TID with meals, and 8 units insulin  glargine-yfgn daily).  Vitamin A : 7.6 (03/06/24).   Diet Order:   Diet Order             DIET - DYS 1 Room service appropriate? No; Fluid consistency: Thin  Diet effective now                   EDUCATION NEEDS:   No education needs have been identified at this time  Skin:  Skin Assessment: Skin Integrity Issues: Skin Integrity Issues:: Unstageable, DTI, Stage IV DTI: lt heel Stage II: - Stage IV: sacrum Unstageable: lt hip, lt shoulder, rt heel,  lt medial foot Other: -  Last BM:  05/14/24 (type 4)  Height:   Ht Readings from Last 1 Encounters:  03/04/24 5' 11 (1.803 m)    Weight:   Wt Readings from Last 1 Encounters:  05/13/24 79.8 kg   BMI:  Body mass index is 24.55 kg/m.  Estimated Nutritional Needs:   Kcal:  2000-2200  Protein:  95-100 grams  Fluid:  >2.0 L    Margery ORN, RD, LDN, CDCES Registered Dietitian III Certified Diabetes  Care and Education Specialist If unable to reach this RD, please use RD Inpatient group chat on secure chat between hours of 8am-4 pm daily

## 2024-05-16 ENCOUNTER — Other Ambulatory Visit: Payer: Self-pay

## 2024-05-16 DIAGNOSIS — R6520 Severe sepsis without septic shock: Secondary | ICD-10-CM | POA: Diagnosis not present

## 2024-05-16 DIAGNOSIS — A419 Sepsis, unspecified organism: Secondary | ICD-10-CM | POA: Diagnosis not present

## 2024-05-16 LAB — BASIC METABOLIC PANEL WITH GFR
Anion gap: 16 — ABNORMAL HIGH (ref 5–15)
BUN: 40 mg/dL — ABNORMAL HIGH (ref 8–23)
CO2: 22 mmol/L (ref 22–32)
Calcium: 8.7 mg/dL — ABNORMAL LOW (ref 8.9–10.3)
Chloride: 105 mmol/L (ref 98–111)
Creatinine, Ser: 1.07 mg/dL (ref 0.61–1.24)
GFR, Estimated: >60 mL/min (ref >60)
Glucose, Bld: 265 mg/dL — ABNORMAL HIGH (ref 70–99)
Potassium: 3.3 mmol/L — ABNORMAL LOW (ref 3.5–5.1)
Sodium: 143 mmol/L (ref 135–145)

## 2024-05-16 LAB — GLUCOSE, CAPILLARY
Glucose-Capillary: 121 mg/dL — ABNORMAL HIGH (ref 70–99)
Glucose-Capillary: 250 mg/dL — ABNORMAL HIGH (ref 70–99)
Glucose-Capillary: 163 mg/dL — ABNORMAL HIGH (ref 70–99)
Glucose-Capillary: 174 mg/dL — ABNORMAL HIGH (ref 70–99)

## 2024-05-16 MED ORDER — POTASSIUM CHLORIDE CRYS ER 20 MEQ PO TBCR
40.0000 meq | EXTENDED_RELEASE_TABLET | ORAL | Status: AC
  Administered 2024-05-16 (×2): 40 meq via ORAL
  Filled 2024-05-16 (×2): qty 2

## 2024-05-16 MED ORDER — ALBUMIN HUMAN 25 % IV SOLN
25.0000 g | Freq: Once | INTRAVENOUS | Status: AC
  Administered 2024-05-16: 25 g via INTRAVENOUS
  Filled 2024-05-16: qty 100

## 2024-05-16 NOTE — Plan of Care (Signed)
  Problem: Fluid Volume: Goal: Hemodynamic stability will improve Outcome: Progressing   Problem: Clinical Measurements: Goal: Diagnostic test results will improve Outcome: Progressing Goal: Signs and symptoms of infection will decrease Outcome: Progressing   Problem: Respiratory: Goal: Ability to maintain adequate ventilation will improve Outcome: Progressing   Problem: Education: Goal: Knowledge of risk factors and measures for prevention of condition will improve Outcome: Progressing   Problem: Coping: Goal: Psychosocial and spiritual needs will be supported Outcome: Progressing   Problem: Respiratory: Goal: Will maintain a patent airway Outcome: Progressing Goal: Complications related to the disease process, condition or treatment will be avoided or minimized Outcome: Progressing   Problem: Education: Goal: Ability to describe self-care measures that may prevent or decrease complications (Diabetes Survival Skills Education) will improve Outcome: Progressing   Problem: Coping: Goal: Ability to adjust to condition or change in health will improve Outcome: Progressing   Problem: Fluid Volume: Goal: Ability to maintain a balanced intake and output will improve Outcome: Progressing   Problem: Health Behavior/Discharge Planning: Goal: Ability to manage health-related needs will improve Outcome: Progressing   Problem: Metabolic: Goal: Ability to maintain appropriate glucose levels will improve Outcome: Progressing   Problem: Nutritional: Goal: Maintenance of adequate nutrition will improve Outcome: Progressing Goal: Progress toward achieving an optimal weight will improve Outcome: Progressing   Problem: Skin Integrity: Goal: Risk for impaired skin integrity will decrease Outcome: Progressing   Problem: Tissue Perfusion: Goal: Adequacy of tissue perfusion will improve Outcome: Progressing   Problem: Education: Goal: Knowledge of General Education information  will improve Description: Including pain rating scale, medication(s)/side effects and non-pharmacologic comfort measures Outcome: Progressing   Problem: Health Behavior/Discharge Planning: Goal: Ability to manage health-related needs will improve Outcome: Progressing   Problem: Clinical Measurements: Goal: Ability to maintain clinical measurements within normal limits will improve Outcome: Progressing Goal: Will remain free from infection Outcome: Progressing Goal: Diagnostic test results will improve Outcome: Progressing Goal: Respiratory complications will improve Outcome: Progressing Goal: Cardiovascular complication will be avoided Outcome: Progressing   Problem: Activity: Goal: Risk for activity intolerance will decrease Outcome: Progressing   Problem: Nutrition: Goal: Adequate nutrition will be maintained Outcome: Progressing   Problem: Coping: Goal: Level of anxiety will decrease Outcome: Progressing   Problem: Elimination: Goal: Will not experience complications related to bowel motility Outcome: Progressing Goal: Will not experience complications related to urinary retention Outcome: Progressing   Problem: Pain Managment: Goal: General experience of comfort will improve and/or be controlled Outcome: Progressing   Problem: Safety: Goal: Ability to remain free from injury will improve Outcome: Progressing   Problem: Skin Integrity: Goal: Risk for impaired skin integrity will decrease Outcome: Progressing

## 2024-05-16 NOTE — Plan of Care (Signed)
   Problem: Fluid Volume: Goal: Hemodynamic stability will improve Outcome: Progressing   Problem: Clinical Measurements: Goal: Diagnostic test results will improve Outcome: Progressing

## 2024-05-16 NOTE — Progress Notes (Signed)
 Occupational Therapy Treatment Patient Details Name: Earl [name] MRN: [id number] DOB: 09/20/1941 Today's Date: 05/16/2024   History of present illness 83 y.o. male admitted from [facility] SNF rehab on 03/04/24 with SOB, sacral pain, Afib with RVR. Pt with acute metabolic encephalopathy, rhabdomyolysis and sepsis secondary to sacral wound infection. 4/15 & 4/17 bedside sacral wound debridement. PMhx: adm 3/26-4/08/2024 with fall, AMS, COVID; d/c to SNF rehab. CVA (2021, residual R-side weakness), T2DM, HTN, CHF, MGUS, Afib on Eliquis .   OT comments  Pt making minimal progress with functional goals. In bed upon arrival and agreeable rolling in bed, reaching/crossing midline activities at bed level, B UE AA/AROM exercises with level 1 yellow theraband. Pt with resting HR 96 and O2 SATs 92%. During activity with OT HR increasing to 114 and O2 SATs dropping to 88%. Encouraged pt to increase meal intake; Pt reports fatigue and not wanting to eat much. OT will continue to follow acutely to maximize level of function and safety      If plan is discharge home, recommend the following:  A lot of help with walking and/or transfers;A lot of help with bathing/dressing/bathroom;Assistance with cooking/housework;Assist for transportation;Help with stairs or ramp for entrance;Two people to help with walking and/or transfers;Assistance with feeding   Equipment Recommendations  Other (comment) (defer)    Recommendations for Other Services      Precautions / Restrictions Precautions Precautions: Fall;Other (comment) Recall of Precautions/Restrictions: Impaired Precaution/Restrictions Comments: sacral wound Restrictions Weight Bearing Restrictions Per Provider Order: No       Mobility Bed Mobility Overal bed mobility: Needs Assistance Bed Mobility: Rolling Rolling: Max assist, Mod assist              Transfers                         Balance                                            ADL either performed or assessed with clinical judgement   ADL       Grooming: Wash/dry hands;Wash/dry face;Set up;Cueing for compensatory techniques;Supervision/safety                                      Extremity/Trunk Assessment Upper Extremity Assessment Upper Extremity Assessment: Generalized weakness;RUE deficits/detail;LUE deficits/detail RUE Deficits / Details: impaired grip/grasp RUE Coordination: decreased fine motor;decreased gross motor LUE Deficits / Details: able to grasp washcloth to wash face and hands LUE Coordination: decreased fine motor            Vision Ability to See in Adequate Light: 0 Adequate Patient Visual Report: No change from baseline     Perception     Praxis     Communication Communication Communication: Impaired Factors Affecting Communication: Reduced clarity of speech;Difficulty expressing self   Cognition Arousal: Alert Behavior During Therapy: WFL for tasks assessed/performed                                 Following commands: Impaired Following commands impaired: Follows one step commands inconsistently, Follows one step commands with increased time      Cueing   Cueing Techniques: Verbal cues, Gestural cues, Tactile cues, Visual  cues  Exercises Other Exercises Other Exercises: supine BUE AAROM/AROM,  bicep curls and FF x 10 reps, 3 sets with level 1 yellow theraband    Shoulder Instructions       General Comments      Pertinent Vitals/ Pain       Pain Assessment Pain Assessment: Faces Faces Pain Scale: Hurts little more Pain Location: L shoulder with ROM reaching to cross midline to grag R rail Pain Descriptors / Indicators: Tender, Sore Pain Intervention(s): Limited activity within patient's tolerance, Monitored during session, Repositioned  Home Living Family/patient expects to be discharged to:: Skilled nursing facility                                         Prior Functioning/Environment              Frequency  Min 2X/week        Progress Toward Goals  OT Goals(current goals can now be found in the care plan section)  Progress towards OT goals: Not progressing toward goals - comment     Plan      Co-evaluation                 AM-PAC OT 6 Clicks Daily Activity     Outcome Measure   Help from another person eating meals?: A Little Help from another person taking care of personal grooming?: A Little Help from another person toileting, which includes using toliet, bedpan, or urinal?: Total Help from another person bathing (including washing, rinsing, drying)?: Total Help from another person to put on and taking off regular upper body clothing?: A Lot Help from another person to put on and taking off regular lower body clothing?: Total 6 Click Score: 11    End of Session    OT Visit Diagnosis: Other abnormalities of gait and mobility (R26.89);Muscle weakness (generalized) (M62.81);History of falling (Z91.81);Feeding difficulties (R63.3);Ataxia, unspecified (R27.0) Pain - Right/Left: Left Pain - part of body: Shoulder   Activity Tolerance Patient limited by fatigue   Patient Left in bed;with call bell/phone within reach   Nurse Communication Mobility status        Time: 1141-1157 OT Time Calculation (min): 16 min  Charges: OT General Charges $OT Visit: 1 Visit OT Treatments $Therapeutic Exercise: 8-22 mins   Jacques Karna Loose 05/16/2024, 1:31 PM

## 2024-05-16 NOTE — Progress Notes (Signed)
 Physical Therapy Treatment Patient Details Name: Earl [name] MRN: [id number] DOB: Aug 01, 1941 Today's Date: 05/16/2024   History of Present Illness 83 y.o. male admitted from [facility] SNF rehab on 03/04/24 with SOB, sacral pain, Afib with RVR. Pt with acute metabolic encephalopathy, rhabdomyolysis and sepsis secondary to sacral wound infection. 4/15 & 4/17 bedside sacral wound debridement. PMhx: adm 3/26-4/08/2024 with fall, AMS, COVID; d/c to SNF rehab. CVA (2021, residual R-side weakness), T2DM, HTN, CHF, MGUS, Afib on Eliquis .    PT Comments  PT pleasant, continues to demonstrate exertional effort with breathing with SPO2 93% on RA. Pt speaking in short clipped phrases, fatigued and difficulty maintaining eyes open. Pt remains with goal to walk home and willing to attempt mobility. Mod +2 to stand with stedy but limited by stool incontinence and denied any attempts for OOB at this time. Will continue to follow and again educated pt on function of air mattress to position and change pressure as pt keeps significant pillows between skin and surface.     If plan is discharge home, recommend the following: Two people to help with walking and/or transfers;Two people to help with bathing/dressing/bathroom;Assistance with feeding;Assist for transportation   Can travel by private vehicle     No  Equipment Recommendations  Hospital bed;Hoyer lift;Wheelchair cushion (measurements PT);Wheelchair (measurements PT)    Recommendations for Other Services       Precautions / Restrictions Precautions Precautions: Fall;Other (comment) Recall of Precautions/Restrictions: Impaired Precaution/Restrictions Comments: sacral wound, rt lean     Mobility  Bed Mobility Overal bed mobility: Needs Assistance Bed Mobility: Rolling, Supine to Sit, Sit to Supine Rolling: Mod assist Sidelying to sit: Used rails, Mod assist   Sit to supine: Mod assist, +2 for safety/equipment   General bed mobility  comments: mod assist to roll bil for pericare and donning binder. Pt incontinent of stool and unaware. HOB flat with mod assist to transition to sitting. mod +2 to return to surface and mod to roll bil end of session.    Transfers Overall transfer level: Needs assistance   Transfers: Sit to/from Stand Sit to Stand: Mod assist, +2 physical assistance, From elevated surface, Via lift equipment           General transfer comment: pt able to stand from elevated bed to stedy x 2 with mod +2 assist. pt able to power up with assist but maintains hip flexion with posterior bias. Max cues for hip and trunk extension and anterior weight shift. limited by stool incontinence in standing and returned to supine. Pt denied OOB to chair. max standing 10 sec Transfer via Lift Equipment: Stedy  Ambulation/Gait                   Stairs             Wheelchair Mobility     Tilt Bed    Modified Rankin (Stroke Patients Only)       Balance Overall balance assessment: Needs assistance Sitting-balance support: Bilateral upper extremity supported, Feet supported, No upper extremity supported Sitting balance-Leahy Scale: Poor Sitting balance - Comments: Rt bias, UB support in sitting, min-mod assist   Standing balance support: Bilateral upper extremity supported, Reliant on assistive device for balance Standing balance-Leahy Scale: Zero                              Communication Communication Communication: Impaired Factors Affecting Communication: Reduced clarity of speech;Difficulty expressing self  Cognition Arousal: Alert Behavior During Therapy: WFL for tasks assessed/performed   PT - Cognitive impairments: Problem solving, Safety/Judgement                       PT - Cognition Comments: slow processing, decreased awareness of safety and deficits, incontinent of stool and unaware Following commands: Impaired Following commands impaired: Follows one step  commands inconsistently, Follows one step commands with increased time    Cueing Cueing Techniques: Verbal cues, Gestural cues, Tactile cues, Visual cues  Exercises      General Comments        Pertinent Vitals/Pain Pain Assessment Pain Assessment: 0-10 Pain Score: 5  Pain Location: sacrum with mobility and pericare Pain Descriptors / Indicators: Tender, Sore Pain Intervention(s): Limited activity within patient's tolerance, Monitored during session, Repositioned    Home Living                          Prior Function            PT Goals (current goals can now be found in the care plan section) Progress towards PT goals: Progressing toward goals    Frequency    Min 1X/week      PT Plan      Co-evaluation              AM-PAC PT 6 Clicks Mobility   Outcome Measure  Help needed turning from your back to your side while in a flat bed without using bedrails?: A Lot Help needed moving from lying on your back to sitting on the side of a flat bed without using bedrails?: A Lot Help needed moving to and from a bed to a chair (including a wheelchair)?: Total Help needed standing up from a chair using your arms (e.g., wheelchair or bedside chair)?: Total Help needed to walk in hospital room?: Total Help needed climbing 3-5 steps with a railing? : Total 6 Click Score: 8    End of Session Equipment Utilized During Treatment: Gait belt Activity Tolerance: Patient tolerated treatment well Patient left: in bed;with call bell/phone within reach Nurse Communication: Mobility status;Need for lift equipment PT Visit Diagnosis: Muscle weakness (generalized) (M62.81);Adult, failure to thrive (R62.7);Difficulty in walking, not elsewhere classified (R26.2);Pain;Other abnormalities of gait and mobility (R26.89)     Time: 8798-8764 PT Time Calculation (min) (ACUTE ONLY): 34 min  Charges:    $Therapeutic Activity: 23-37 mins PT General Charges $$ ACUTE PT  VISIT: 1 Visit                     Lenoard SQUIBB, PT Acute Rehabilitation Services Office: [phone]    Lenoard NOVAK Robyne Matar 05/16/2024, 12:55 PM

## 2024-05-16 NOTE — Progress Notes (Signed)
 PROGRESS NOTE    Earl [name]  FMW:[id number] DOB: Aug 03, 1941 DOA: 03/04/2024 PCP: [name], MD   Brief Narrative:  83 year old male with history of PAF on Eliquis , IDDM 2, HTN, CVA with right-sided residual weakness, MGUS, BPH, systolic CHF who is here with sacral pain. Of note, he was hospitalized 3/26-4/10 with generalized weakness/acute encephalopathy, hypotension, and was COVID PCR positive. He was eventually discharged to SNF and readmitted 4 days later with pain in the sacral area and shortness of breath. He was found to be in A-fib with RVR, tachypneic and was hypotensive on admission for which critical care was consulted. General surgery was consulted for sacral wound debridement status post bedside excision 4/15 and 03/07/2024.   Assessment & Plan:   Principal Problem:   Severe sepsis (HCC) Active Problems:   Paroxysmal atrial fibrillation (HCC)   Sepsis (HCC)   Atrial flutter (HCC)   Sacral ulcer (HCC)   MGUS (monoclonal gammopathy of unknown significance)   Essential hypertension   Hyperlipidemia   Insulin  dependent type 2 diabetes mellitus (HCC)   History of CVA (cerebrovascular accident)   Cardiac amyloidosis (HCC)   Chronic systolic CHF (congestive heart failure) (HCC)   GERD (gastroesophageal reflux disease)   History of hypertension   Atrial fibrillation with RVR (HCC)   Community acquired pneumonia   Protein-calorie malnutrition, severe   Wild-type transthyretin-related (ATTR) amyloidosis (HCC)  Stage IV sacral decubitus wound/ulcer-repeat CT scan on 6/16 with some cortical irregularity along the underlying dorsal mid sacrum, concerning for osteomyelitis, findings appearing to be new.  Infectious disease MD reconsulted and Dr. Dea reviewed the case, did not recommend antibiotics but wound care alone, with plans for antibiotics if and when signs of infection - Dr. Waddell with plastic surgery evaluated patient 6/17, appears that he has a clean  sacral decubitus, not a candidate for any type of flap closure.  Per his note, patient nor daughter were interested in surgery at this time.  Plastic surgery also recommends wound care - He most recently finished Fortaz  on 6/13, most recent wound culture done on 6/4 showed Pseudomonas and Klebsiella - Wound care reevaluated, discussed with PT, have been using Aquacel but it appears that on most recent dressing change it appears that it was not covering certain areas, discussed with wound care and change to Vashe, reevaluate this week, appreciate wound care follow-up   Severe sepsis-on admission, patient had lactic acidosis, white count, fever secondary to infected chronic wound.  He has received multiple antibiotics during his long hospitalization.  Sepsis physiology has now resolved and he is currently afebrile, normotensive, not tachycardic.  White count is now stable   Acute on chronic pain-secondary to above, continue pain regimen with oxycodone  as below   Acute on chronic combined CHF, cardiac amyloidosis, nonischemic cardiomyopathy -on tafamidis  and vutrisiran  for amyloid.  Goal-directed medical therapy somewhat limited by prior episodes of hypotension.  Currently there is evidence that he is having anasarca/fluid overload for which he has been placed on Lasix  40 mg IV twice daily.  His edema is mostly dependent, confined to the bilateral upper thighs.  I did not see any edema in the abdominal wall.  No edema below the knees either.  He does not have any crackles on examination.  Patient does appear to have mild dyspnea when talking and that is likely secondary to profound deconditioning and generalized weakness.  We will continue Lasix  today, I will repeat albumin  today.  I have updated all of this to the daughter  today.   PAF-continue amiodarone  and Eliquis    Anemia of chronic disease-received a unit of packed red blood cells on 4/30.  Hemoglobin has remained stable, no bleeding   Elevated  LFTs-mild, no nausea, no vomiting, no right upper quadrant pain.  Probably due to receiving Tylenol  around-the-clock, discussed with the bedside RN to hold Tylenol  dosing today   Dysphagia/severe protein calorie malnutrition-continue supplementation, pured diet   Physical debilitation, muscle weakness-PT/OT ongoing   Goals of care -palliative consulted as well, appears to be DNR.  Daughter wants him to transition at home once his wounds are better   IDDM -continue Semglee , sliding scale, Accu-Cheks  DVT prophylaxis: SCDs Start: 03/04/24 2151 Place TED hose Start: 03/04/24 2151   Code Status: Limited: Do not attempt resuscitation (DNR) -DNR-LIMITED -Do Not Intubate/DNI   Family Communication:  None present at bedside.  Discussed with daughter, answered all her questions.  Status is: Inpatient Remains inpatient appropriate because: Sacral wound   Estimated body mass index is 24.55 kg/m as calculated from the following:   Height as of this encounter: 5' 11 (1.803 m).   Weight as of this encounter: 79.8 kg.  Pressure Injury 02/26/24 Coccyx Mid Stage 4 - Full thickness tissue loss with exposed bone, tendon or muscle. (Active)  02/26/24 0916  Location: Coccyx  Location Orientation: Mid  Staging: Stage 4 - Full thickness tissue loss with exposed bone, tendon or muscle.  Wound Description (Comments):   DO NOT USE:  Present on Admission: Yes  Dressing Type Foam - Lift dressing to assess site every shift;Gauze (Comment) 05/15/24 0900     Pressure Injury 02/26/24 Toe (Comment  which one) Anterior;Right Deep Tissue Pressure Injury - Purple or maroon localized area of discolored intact skin or blood-filled blister due to damage of underlying soft tissue from pressure and/or shear. right gre (Active)  02/26/24 1215  Location: Toe (Comment  which one)  Location Orientation: Anterior;Right  Staging: Deep Tissue Pressure Injury - Purple or maroon localized area of discolored intact skin or  blood-filled blister due to damage of underlying soft tissue from pressure and/or shear.  Wound Description (Comments): right great toe  DO NOT USE:  Present on Admission:   Dressing Type None 05/15/24 0900     Pressure Injury 03/06/24 Hip Anterior;Left;Proximal Unstageable - Full thickness tissue loss in which the base of the injury is covered by slough (yellow, tan, gray, green or brown) and/or eschar (tan, brown or black) in the wound bed. unstageable (Active)  03/06/24 1612  Location: Hip  Location Orientation: Anterior;Left;Proximal  Staging: Unstageable - Full thickness tissue loss in which the base of the injury is covered by slough (yellow, tan, gray, green or brown) and/or eschar (tan, brown or black) in the wound bed.  Wound Description (Comments): unstageable  DO NOT USE:  Present on Admission: No  Dressing Type Foam - Lift dressing to assess site every shift;Gauze (Comment);Santyl ;Barrier Film (skin prep);Moist to moist 05/14/24 1432     Pressure Injury 03/06/24 Shoulder Left Unstageable - Full thickness tissue loss in which the base of the injury is covered by slough (yellow, tan, gray, green or brown) and/or eschar (tan, brown or black) in the wound bed. unstageable (Active)  03/06/24 1613  Location: Shoulder  Location Orientation: Left  Staging: Unstageable - Full thickness tissue loss in which the base of the injury is covered by slough (yellow, tan, gray, green or brown) and/or eschar (tan, brown or black) in the wound bed.  Wound Description (Comments): unstageable  DO NOT USE:  Present on Admission: No  Dressing Type Foam - Lift dressing to assess site every shift 05/15/24 0900     Pressure Injury 04/23/24 Heel Right Unstageable - Full thickness tissue loss in which the base of the injury is covered by slough (yellow, tan, gray, green or brown) and/or eschar (tan, brown or black) in the wound bed. DTPI evolving into unstageable (Active)  04/23/24   Location: Heel   Location Orientation: Right  Staging: Unstageable - Full thickness tissue loss in which the base of the injury is covered by slough (yellow, tan, gray, green or brown) and/or eschar (tan, brown or black) in the wound bed.  Wound Description (Comments): DTPI evolving into unstageable  DO NOT USE:  Present on Admission: No  Dressing Type Foam - Lift dressing to assess site every shift 05/15/24 0900     Pressure Injury 04/23/24 Heel Left Deep Tissue Pressure Injury - Purple or maroon localized area of discolored intact skin or blood-filled blister due to damage of underlying soft tissue from pressure and/or shear. (Active)  04/23/24   Location: Heel  Location Orientation: Left  Staging: Deep Tissue Pressure Injury - Purple or maroon localized area of discolored intact skin or blood-filled blister due to damage of underlying soft tissue from pressure and/or shear.  Wound Description (Comments):   DO NOT USE:  Present on Admission: No  Dressing Type Foam - Lift dressing to assess site every shift 05/15/24 0900   Nutritional Assessment: Body mass index is 24.55 kg/m.SABRA Seen by dietician.  I agree with the assessment and plan as outlined below: Nutrition Status: Nutrition Problem: Severe Malnutrition Etiology: chronic illness Signs/Symptoms: energy intake < or equal to 75% for > or equal to 1 month, severe muscle depletion Interventions: Ensure Enlive (each supplement provides 350kcal and 20 grams of protein), Magic cup, Hormel Shake, MVI, Juven, Other (Comment) (vitamin C )  . Skin Assessment: I have examined the patient's skin and I agree with the wound assessment as performed by the wound care RN as outlined below: Pressure Injury 02/26/24 Coccyx Mid Stage 4 - Full thickness tissue loss with exposed bone, tendon or muscle. (Active)  02/26/24 0916  Location: Coccyx  Location Orientation: Mid  Staging: Stage 4 - Full thickness tissue loss with exposed bone, tendon or muscle.  Wound  Description (Comments):   DO NOT USE:  Present on Admission: Yes  Dressing Type Foam - Lift dressing to assess site every shift;Gauze (Comment) 05/15/24 0900     Pressure Injury 02/26/24 Toe (Comment  which one) Anterior;Right Deep Tissue Pressure Injury - Purple or maroon localized area of discolored intact skin or blood-filled blister due to damage of underlying soft tissue from pressure and/or shear. right gre (Active)  02/26/24 1215  Location: Toe (Comment  which one)  Location Orientation: Anterior;Right  Staging: Deep Tissue Pressure Injury - Purple or maroon localized area of discolored intact skin or blood-filled blister due to damage of underlying soft tissue from pressure and/or shear.  Wound Description (Comments): right great toe  DO NOT USE:  Present on Admission:   Dressing Type None 05/15/24 0900     Pressure Injury 03/06/24 Hip Anterior;Left;Proximal Unstageable - Full thickness tissue loss in which the base of the injury is covered by slough (yellow, tan, gray, green or brown) and/or eschar (tan, brown or black) in the wound bed. unstageable (Active)  03/06/24 1612  Location: Hip  Location Orientation: Anterior;Left;Proximal  Staging: Unstageable - Full thickness tissue loss in which  the base of the injury is covered by slough (yellow, tan, gray, green or brown) and/or eschar (tan, brown or black) in the wound bed.  Wound Description (Comments): unstageable  DO NOT USE:  Present on Admission: No  Dressing Type Foam - Lift dressing to assess site every shift;Gauze (Comment);Santyl ;Barrier Film (skin prep);Moist to moist 05/14/24 1432     Pressure Injury 03/06/24 Shoulder Left Unstageable - Full thickness tissue loss in which the base of the injury is covered by slough (yellow, tan, gray, green or brown) and/or eschar (tan, brown or black) in the wound bed. unstageable (Active)  03/06/24 1613  Location: Shoulder  Location Orientation: Left  Staging: Unstageable - Full  thickness tissue loss in which the base of the injury is covered by slough (yellow, tan, gray, green or brown) and/or eschar (tan, brown or black) in the wound bed.  Wound Description (Comments): unstageable  DO NOT USE:  Present on Admission: No  Dressing Type Foam - Lift dressing to assess site every shift 05/15/24 0900     Pressure Injury 04/23/24 Heel Right Unstageable - Full thickness tissue loss in which the base of the injury is covered by slough (yellow, tan, gray, green or brown) and/or eschar (tan, brown or black) in the wound bed. DTPI evolving into unstageable (Active)  04/23/24   Location: Heel  Location Orientation: Right  Staging: Unstageable - Full thickness tissue loss in which the base of the injury is covered by slough (yellow, tan, gray, green or brown) and/or eschar (tan, brown or black) in the wound bed.  Wound Description (Comments): DTPI evolving into unstageable  DO NOT USE:  Present on Admission: No  Dressing Type Foam - Lift dressing to assess site every shift 05/15/24 0900     Pressure Injury 04/23/24 Heel Left Deep Tissue Pressure Injury - Purple or maroon localized area of discolored intact skin or blood-filled blister due to damage of underlying soft tissue from pressure and/or shear. (Active)  04/23/24   Location: Heel  Location Orientation: Left  Staging: Deep Tissue Pressure Injury - Purple or maroon localized area of discolored intact skin or blood-filled blister due to damage of underlying soft tissue from pressure and/or shear.  Wound Description (Comments):   DO NOT USE:  Present on Admission: No  Dressing Type Foam - Lift dressing to assess site every shift 05/15/24 0900    Consultants:  Plastic surgery, wound care, general surgery  Procedures:  As above  Antimicrobials:  Anti-infectives (From admission, onward)    Start     Dose/Rate Route Frequency Ordered Stop   04/27/24 1615  cefTAZidime  (FORTAZ ) 2 g in sodium chloride  0.9 % 100 mL IVPB         2 g 200 mL/hr over 30 Minutes Intravenous Every 8 hours 04/27/24 1518 05/03/24 2359   03/16/24 1015  cefTRIAXone  (ROCEPHIN ) 2 g in sodium chloride  0.9 % 100 mL IVPB        2 g 200 mL/hr over 30 Minutes Intravenous Every 24 hours 03/16/24 0929 03/20/24 1219   03/16/24 1015  doxycycline  (VIBRA -TABS) tablet 100 mg        100 mg Oral Every 12 hours 03/16/24 0929 03/20/24 2131   03/14/24 1000  metroNIDAZOLE  (FLAGYL ) tablet 500 mg        500 mg Oral Every 12 hours 03/14/24 0803 03/19/24 2219   03/13/24 1000  cefadroxil  (DURICEF) capsule 1,000 mg  Status:  Discontinued        1,000 mg Oral 2  times daily 03/12/24 1632 03/16/24 0929   03/07/24 2200  metroNIDAZOLE  (FLAGYL ) tablet 500 mg        500 mg Oral Every 12 hours 03/07/24 0840 03/11/24 2258   03/05/24 1930  vancomycin  (VANCOREADY) IVPB 1250 mg/250 mL  Status:  Discontinued        1,250 mg 166.7 mL/hr over 90 Minutes Intravenous Every 24 hours 03/04/24 2205 03/04/24 2224   03/05/24 1930  Vancomycin  (VANCOCIN ) 1,250 mg in sodium chloride  0.9 % 250 mL IVPB  Status:  Discontinued        1,250 mg 166.7 mL/hr over 90 Minutes Intravenous Every 24 hours 03/04/24 2224 03/05/24 0717   03/05/24 1930  vancomycin  (VANCOREADY) IVPB 1500 mg/300 mL  Status:  Discontinued        1,500 mg 150 mL/hr over 120 Minutes Intravenous Every 24 hours 03/05/24 0717 03/12/24 1636   03/05/24 1400  ceFEPIme  (MAXIPIME ) 2 g in sodium chloride  0.9 % 100 mL IVPB  Status:  Discontinued        2 g 200 mL/hr over 30 Minutes Intravenous Every 8 hours 03/05/24 0717 03/12/24 1632   03/05/24 0600  ceFEPIme  (MAXIPIME ) 2 g in sodium chloride  0.9 % 100 mL IVPB  Status:  Discontinued        2 g 200 mL/hr over 30 Minutes Intravenous Every 12 hours 03/04/24 2205 03/05/24 0717   03/05/24 0000  metroNIDAZOLE  (FLAGYL ) IVPB 500 mg  Status:  Discontinued        500 mg 100 mL/hr over 60 Minutes Intravenous Every 12 hours 03/04/24 2206 03/04/24 2209   03/04/24 1730  aztreonam (AZACTAM) 2  g in sodium chloride  0.9 % 100 mL IVPB  Status:  Discontinued        2 g 200 mL/hr over 30 Minutes Intravenous  Once 03/04/24 1715 03/04/24 1724   03/04/24 1730  metroNIDAZOLE  (FLAGYL ) IVPB 500 mg  Status:  Discontinued        500 mg 100 mL/hr over 60 Minutes Intravenous Every 12 hours 03/04/24 1715 03/07/24 0840   03/04/24 1730  vancomycin  (VANCOREADY) IVPB 1500 mg/300 mL        1,500 mg 150 mL/hr over 120 Minutes Intravenous  Once 03/04/24 1715 03/04/24 2153   03/04/24 1730  ceFEPIme  (MAXIPIME ) 2 g in sodium chloride  0.9 % 100 mL IVPB        2 g 200 mL/hr over 30 Minutes Intravenous  Once 03/04/24 1724 03/04/24 1826         Subjective: Patient seen and examined, fully alert and oriented.  Complains of aches and pains all over the body, likely secondary to laying in the bed and deconditioning.  No specific complaint.  Objective: Vitals:   05/16/24 0035 05/16/24 0421 05/16/24 0751 05/16/24 1153  BP: 139/83 116/77 126/88 (!) 142/78  Pulse: 80 76 76 80  Resp: 17 17 19 18   Temp: 97.7 F (36.5 C) (!) 97.5 F (36.4 C) 97.9 F (36.6 C) (!) 97.4 F (36.3 C)  TempSrc:      SpO2: 97% 100% 97% 100%  Weight:      Height:        Intake/Output Summary (Last 24 hours) at 05/16/2024 1321 Last data filed at 05/16/2024 1247 Gross per 24 hour  Intake 320 ml  Output 2050 ml  Net -1730 ml   Filed Weights   05/07/24 0500 05/09/24 0607 05/13/24 0513  Weight: 80.1 kg 79.8 kg 79.8 kg    Examination:  General exam: Appears calm and  comfortable, appears cachectic and deconditioned Respiratory system: Clear to auscultation. Respiratory effort normal. Cardiovascular system: S1 & S2 heard, RRR. No JVD, murmurs, rubs, gallops or clicks. No pedal edema.  Has bilateral edema in the upper thighs. Gastrointestinal system: Abdomen is nondistended, soft and nontender. No organomegaly or masses felt. Normal bowel sounds heard. Central nervous system: Alert and oriented. No focal neurological  deficits. Extremities: Symmetric 5 x 5 power. Skin: No rashes, lesions or ulcers.  .    Data Reviewed: I have personally reviewed following labs and imaging studies  CBC: Recent Labs  Lab 05/11/24 0606 05/13/24 0937 05/15/24 1402  WBC 5.0 5.4 5.7  HGB 11.0* 11.5* 11.3*  HCT 33.9* 35.8* 34.5*  MCV 88.1 88.8 87.6  PLT 215 244 241   Basic Metabolic Panel: Recent Labs  Lab 05/11/24 0606 05/13/24 0937 05/15/24 1402  NA 143 143 145  K 3.3* 3.9 3.4*  CL 109 104 109  CO2 26 23 24   GLUCOSE 69* 184* 161*  BUN 38* 35* 32*  CREATININE 0.95 1.00 0.96  CALCIUM  8.6* 8.9 9.0  MG 1.8 2.1 1.9  PHOS  --   --  3.5   GFR: Estimated Creatinine Clearance: 62.1 mL/min (by C-G formula based on SCr of 0.96 mg/dL). Liver Function Tests: Recent Labs  Lab 05/11/24 0606 05/13/24 0937 05/15/24 1402  AST 54* 94* 60*  ALT 62* 123* 80*  ALKPHOS 133* 179* 155*  BILITOT 0.9 1.2 1.2  PROT 5.7* 6.3* 6.2*  ALBUMIN  2.6* 3.2* 3.1*   No results for input(s): LIPASE, AMYLASE in the last 168 hours. No results for input(s): AMMONIA in the last 168 hours. Coagulation Profile: No results for input(s): INR, PROTIME in the last 168 hours. Cardiac Enzymes: No results for input(s): CKTOTAL, CKMB, CKMBINDEX, TROPONINI in the last 168 hours. BNP (last 3 results) No results for input(s): PROBNP in the last 8760 hours. HbA1C: No results for input(s): HGBA1C in the last 72 hours. CBG: Recent Labs  Lab 05/15/24 1226 05/15/24 1614 05/15/24 2047 05/16/24 0932 05/16/24 1153  GLUCAP 188* 144* 154* 121* 250*   Lipid Profile: No results for input(s): CHOL, HDL, LDLCALC, TRIG, CHOLHDL, LDLDIRECT in the last 72 hours. Thyroid  Function Tests: No results for input(s): TSH, T4TOTAL, FREET4, T3FREE, THYROIDAB in the last 72 hours. Anemia Panel: No results for input(s): VITAMINB12, FOLATE, FERRITIN, TIBC, IRON, RETICCTPCT in the last 72 hours. Sepsis  Labs: No results for input(s): PROCALCITON, LATICACIDVEN in the last 168 hours.  Recent Results (from the past 240 hours)  Body fluid culture w Gram Stain     Status: None   Collection Time: 05/08/24  1:50 PM   Specimen: Lung, Left; Pleural Fluid  Result Value Ref Range Status   Specimen Description FLUID  Final   Special Requests NONE  Final   Gram Stain NO WBC SEEN NO ORGANISMS SEEN   Final   Culture   Final    NO GROWTH 3 DAYS Performed at [facility], [address], [place], [place] [address]    Report Status 05/11/2024 FINAL  Final     Radiology Studies: No results found.  Scheduled Meds:  acetaminophen   500 mg Oral TID   amiodarone   200 mg Oral Daily   apixaban   5 mg Oral BID   ascorbic acid   500 mg Oral BID   atorvastatin   40 mg Oral Daily   Chlorhexidine  Gluconate Cloth  6 each Topical Daily   collagenase    Topical Daily  diclofenac  Sodium  4 g Topical QID   feeding supplement  237 mL Oral BID BM   ferrous sulfate   325 mg Oral QHS   furosemide   40 mg Intravenous BID AC & HS   guaiFENesin   600 mg Oral BID   insulin  aspart  0-5 Units Subcutaneous QHS   insulin  aspart  0-9 Units Subcutaneous TID WC   insulin  glargine-yfgn  8 Units Subcutaneous Daily   levothyroxine   50 mcg Oral Q0600   liver oil-zinc  oxide   Topical BID   multivitamin with minerals  1 tablet Oral Daily   nutrition supplement (JUVEN)  1 packet Oral BID BM   mouth rinse  15 mL Mouth Rinse 4 times per day   oxyCODONE   5 mg Oral QHS   phosphorus  500 mg Oral TID   polyethylene glycol  17 g Oral BID   senna-docusate  1 tablet Oral QHS   sertraline   25 mg Oral Daily   sodium chloride  flush  10-40 mL Intracatheter Q12H   sodium hypochlorite   Topical Daily   Tafamidis   1 capsule Oral Daily   vitamin A   10,000 Units Oral Daily   vutrisiran  sodium  25 mg Subcutaneous Q90 days   zinc  sulfate (50mg  elemental zinc )  220 mg Oral Daily   Continuous Infusions:  albumin  human     dextrose   Stopped (05/01/24 0003)     LOS: 73 days   Fredia Skeeter, MD [facility]  05/16/2024, 1:21 PM   *Please note that this is a verbal dictation therefore any spelling or grammatical errors are due to the Dragon [employer] One system interpretation.  Please page via Amion and do not message via secure chat for urgent patient care matters. Secure chat can be used for non urgent patient care matters.  How to contact the [facility] Attending or Consulting provider 7A - 7P or covering provider during after hours 7P -7A, for this patient?  Check the care team in [facility] and look for a) attending/consulting [facility] provider listed and b) the [facility] team listed. Page or secure chat 7A-7P. Log into [email or web address] and use [facility]'s universal password to access. If you do not have the password, please contact the hospital operator. Locate the [facility] provider you are looking for under [facility] and page to a number that you can be directly reached. If you still have difficulty reaching the provider, please page the [facility] (Director on Call) for the Hospitalists listed on amion for assistance.

## 2024-05-16 NOTE — Progress Notes (Signed)
 Speech Language Pathology Treatment: Cognitive-Linguistic  Patient Details Name: Earl [name] MRN: [id number] DOB: 1940/12/08 Today's Date: 05/16/2024 Time: 9249-9188 SLP Time Calculation (min) (ACUTE ONLY): 21 min  Assessment / Plan / Recommendation Clinical Impression  Pt seen for speech tx session involving exercises for respiratory strengthening to support vocal intensity. Pt completed IS exercises x5-6 sets of 5 each (~30 total) with ranges from 639mL-250mL inspiration. Pt typically able to achieve for initial 1-2 trials and then steadily declined with volumes due to fatigue. He completed EMST exercise x15-20 on lowest setting. Pt encouraged to take frequent breaks between exercise sets. Pt encouraged to complete x2 more sets of exercises later today.   Recommend continue low intensity SLP services to support respiratory strength training with plan to implement independent exercise program with pt and his daughter. Daughter not at bedside this morning. SLP will continue to follow.    HPI HPI: Patient is an 83 y.o. 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 He was discharged from the hospital (admitted for acute metabolic encephalopathy, hypotensive, hypoglycemic) to [facility] for rehab. He presented back to the hospital four days later on 03/04/24 with SOB and pain in sacral area. 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 SLP previous saw pt to assess and manage concerns for dysphagia. SLP re-consulted on 05/02/24 for voice assessment.      SLP Plan  Continue with current plan of care          Recommendations   Continue low intensity respiratory exercises to support breath support for running speech. Work towards independent  exercise program with daughter.                       Frequent or constant Supervision/Assistance Aphonia (R49.1);Dysarthria and anarthria (R47.1)     Continue with current plan of care     Earl [name]  05/16/2024, 10:15 AM

## 2024-05-17 DIAGNOSIS — R6520 Severe sepsis without septic shock: Secondary | ICD-10-CM | POA: Diagnosis not present

## 2024-05-17 DIAGNOSIS — A419 Sepsis, unspecified organism: Secondary | ICD-10-CM | POA: Diagnosis not present

## 2024-05-17 LAB — GLUCOSE, CAPILLARY
Glucose-Capillary: 180 mg/dL — ABNORMAL HIGH (ref 70–99)
Glucose-Capillary: 173 mg/dL — ABNORMAL HIGH (ref 70–99)
Glucose-Capillary: 174 mg/dL — ABNORMAL HIGH (ref 70–99)
Glucose-Capillary: 164 mg/dL — ABNORMAL HIGH (ref 70–99)

## 2024-05-17 NOTE — Progress Notes (Signed)
 Physical Therapy Wound Treatment Patient Details  Name: Earl [name] MRN: [id number] Date of Birth: [birth year]  Today's Date: 05/17/2024 Time: 8669-8584 Time Calculation (min): 45 min  Subjective  Subjective Assessment Subjective: Pt pleasant and agreeable to wound therapy. Patient and Family Stated Goals: Heal wounds Date of Onset:  (Unknown) Prior Treatments: Dressing changes, off weighting  Pain Score:  Pt premedicated and tolerated treatment without complaints of pain.   Wound Assessment  Pressure Injury 03/06/24 Hip Anterior;Left;Proximal Unstageable - Full thickness tissue loss in which the base of the injury is covered by slough (yellow, tan, gray, green or brown) and/or eschar (tan, brown or black) in the wound bed. unstageable (Active)  Wound Image   05/17/24 1457  Dressing Type Foam - Lift dressing to assess site every shift;Gauze (Comment);Dakin's-soaked gauze;Barrier Film (skin prep);Moist to moist 05/17/24 1457  Dressing Clean, Dry, Intact;Changed 05/17/24 1457  Dressing Change Frequency Daily 05/17/24 1457  State of Healing Early/partial granulation 05/17/24 1457  Site / Wound Assessment Pink;Red;Yellow 05/17/24 1457  % Wound base Red or Granulating 10% 05/17/24 1457  % Wound base Yellow/Fibrinous Exudate 50% 05/17/24 1457  % Wound base Black/Eschar 0% 05/17/24 1457  % Wound base Other/Granulation Tissue (Comment) 40% 05/17/24 1457  Peri-wound Assessment Intact;Pink 05/17/24 1457  Wound Length (cm) 4.5 cm 05/17/24 1457  Wound Width (cm) 3 cm 05/17/24 1457  Wound Depth (cm) 1.9 cm 05/17/24 1457  Wound Surface Area (cm^2) 13.5 cm^2 05/17/24 1457  Wound Volume (cm^3) 25.65 cm^3 05/17/24 1457  Tunneling (cm) 0 05/17/24 1457  Undermining (cm) ~0.4 cm 12:00-1:00 05/17/24 1457  Margins Unattached edges (unapproximated) 05/17/24 1457  Drainage Amount Moderate 05/17/24 1457  Drainage Description Green;No odor;Purulent 05/17/24 1457  Treatment Debridement  (Selective);Irrigation;Other (Comment) 05/17/24 1457      Selective Debridement (non-excisional) Selective Debridement (non-excisional) - Location: L hip Selective Debridement (non-excisional) - Tools Used: Forceps, Scalpel Selective Debridement (non-excisional) - Tissue Removed: yellow and blue/green unviable tissue    Wound Assessment and Plan  Wound Therapy - Assess/Plan/Recommendations Wound Therapy - Clinical Statement: Wound bed continues to appear improved. Healthy adipose present in the center of the wound and focus of debridement was outer edges  where yellow and blue/green unviable tissue present. This patient will benefit from continued wound therapy of the L hip for selective removal of unviable tissue, to decrease bioburden, and promote wound bed healing. Will continue to follow. Wound Therapy - Functional Problem List: Decreased tolerance for position changes or OOB. Factors Delaying/Impairing Wound Healing: Diabetes Mellitus, Immobility, Infection - systemic/local, Multiple medical problems Hydrotherapy Plan: Debridement, Dressing change, Patient/family education Wound Therapy - Frequency: 2X / week Wound Therapy - Follow Up Recommendations: dressing changes by RN  Wound Therapy Goals- Improve the function of patient's integumentary system by progressing the wound(s) through the phases of wound healing (inflammation - proliferation - remodeling) by: Wound Therapy Goals - Improve the function of patient's integumentary system by progressing the wound(s) through the phases of wound healing by: Decrease Necrotic Tissue to: 20% Decrease Necrotic Tissue - Progress: Progressing toward goal Increase Granulation Tissue to: 80% Increase Granulation Tissue - Progress: Progressing toward goal Goals/treatment plan/discharge plan were made with and agreed upon by patient/family: Yes Time For Goal Achievement: 7 days Wound Therapy - Potential for Goals: Good  Goals will be updated until  maximal potential achieved or discharge criteria met.  Discharge criteria: when goals achieved, discharge from hospital, MD decision/surgical intervention, no progress towards goals, refusal/missing three consecutive treatments without notification or medical reason.  GP     Charges PT Wound Care Charges $Wound Debridement up to 20 cm: < or equal to 20 cm $PT Hydrotherapy Dressing: 2 dressings $PT Hydrotherapy Visit: 1 Visit       Earl [name] 05/17/2024, 3:02 PM  Earl [name], PT, DPT Acute Rehabilitation Services Secure Chat Preferred Office: [phone]

## 2024-05-17 NOTE — Plan of Care (Addendum)
  Problem: Fluid Volume: Goal: Hemodynamic stability will improve Outcome: Progressing   Problem: Clinical Measurements: Goal: Diagnostic test results will improve Outcome: Progressing Goal: Signs and symptoms of infection will decrease Outcome: Progressing   Problem: Respiratory: Goal: Ability to maintain adequate ventilation will improve Outcome: Progressing   Problem: Education: Goal: Knowledge of risk factors and measures for prevention of condition will improve Outcome: Progressing   Problem: Coping: Goal: Psychosocial and spiritual needs will be supported Outcome: Progressing   Problem: Respiratory: Goal: Will maintain a patent airway Outcome: Progressing Goal: Complications related to the disease process, condition or treatment will be avoided or minimized Outcome: Progressing   Problem: Education: Goal: Ability to describe self-care measures that may prevent or decrease complications (Diabetes Survival Skills Education) will improve Outcome: Progressing   Problem: Coping: Goal: Ability to adjust to condition or change in health will improve Outcome: Progressing   Problem: Fluid Volume: Goal: Ability to maintain a balanced intake and output will improve Outcome: Progressing   Problem: Health Behavior/Discharge Planning: Goal: Ability to manage health-related needs will improve Outcome: Progressing   Problem: Metabolic: Goal: Ability to maintain appropriate glucose levels will improve Outcome: Progressing   Problem: Nutritional: Goal: Maintenance of adequate nutrition will improve Outcome: Progressing Goal: Progress toward achieving an optimal weight will improve Outcome: Progressing   Problem: Skin Integrity: Goal: Risk for impaired skin integrity will decrease Outcome: Progressing   Problem: Tissue Perfusion: Goal: Adequacy of tissue perfusion will improve Outcome: Progressing   Problem: Education: Goal: Knowledge of General Education information  will improve Description: Including pain rating scale, medication(s)/side effects and non-pharmacologic comfort measures Outcome: Progressing   Problem: Health Behavior/Discharge Planning: Goal: Ability to manage health-related needs will improve Outcome: Progressing   Problem: Clinical Measurements: Goal: Ability to maintain clinical measurements within normal limits will improve Outcome: Progressing Goal: Will remain free from infection Outcome: Progressing Goal: Diagnostic test results will improve Outcome: Progressing Goal: Respiratory complications will improve Outcome: Progressing Goal: Cardiovascular complication will be avoided Outcome: Progressing   Problem: Activity: Goal: Risk for activity intolerance will decrease Outcome: Progressing   Problem: Nutrition: Goal: Adequate nutrition will be maintained Outcome: Progressing   Problem: Coping: Goal: Level of anxiety will decrease Outcome: Progressing   Problem: Elimination: Goal: Will not experience complications related to bowel motility Outcome: Progressing Goal: Will not experience complications related to urinary retention Outcome: Progressing   Problem: Pain Managment: Goal: General experience of comfort will improve and/or be controlled Outcome: Progressing   Problem: Safety: Goal: Ability to remain free from injury will improve Outcome: Progressing   Problem: Skin Integrity: Goal: Risk for impaired skin integrity will decrease Outcome: Progressing

## 2024-05-17 NOTE — Plan of Care (Signed)
   Medical records reviewed including progress notes, labs and imaging.  Noted patient has been working with SLP and PT, as well as receiving wound care.  Per MAR, he has had 1 dose of as needed Atarax  in the past 24 hours.  Remains on Zoloft .  Goals of care are clear at this time for medical optimization and return home to continue managing chronic illnesses.  PMT will continue to follow peripherally and remains available for additional palliative needs.  Thank you for your referral and allowing PMT to assist in [name]'s care.   Janasha Barkalow, PA-C Palliative Medicine Team  Team Phone # [phone]   NO CHARGE

## 2024-05-17 NOTE — Plan of Care (Signed)
  Problem: Fluid Volume: Goal: Hemodynamic stability will improve Outcome: Progressing   Problem: Clinical Measurements: Goal: Diagnostic test results will improve Outcome: Progressing Goal: Signs and symptoms of infection will decrease Outcome: Progressing   Problem: Respiratory: Goal: Ability to maintain adequate ventilation will improve Outcome: Progressing   Problem: Education: Goal: Knowledge of risk factors and measures for prevention of condition will improve Outcome: Progressing   Problem: Coping: Goal: Psychosocial and spiritual needs will be supported Outcome: Progressing   Problem: Respiratory: Goal: Will maintain a patent airway Outcome: Progressing Goal: Complications related to the disease process, condition or treatment will be avoided or minimized Outcome: Progressing   Problem: Education: Goal: Ability to describe self-care measures that may prevent or decrease complications (Diabetes Survival Skills Education) will improve Outcome: Progressing   Problem: Coping: Goal: Ability to adjust to condition or change in health will improve Outcome: Progressing   Problem: Fluid Volume: Goal: Ability to maintain a balanced intake and output will improve Outcome: Progressing   Problem: Health Behavior/Discharge Planning: Goal: Ability to manage health-related needs will improve Outcome: Progressing   Problem: Metabolic: Goal: Ability to maintain appropriate glucose levels will improve Outcome: Progressing   Problem: Nutritional: Goal: Maintenance of adequate nutrition will improve Outcome: Progressing Goal: Progress toward achieving an optimal weight will improve Outcome: Progressing   Problem: Skin Integrity: Goal: Risk for impaired skin integrity will decrease Outcome: Progressing   Problem: Tissue Perfusion: Goal: Adequacy of tissue perfusion will improve Outcome: Progressing   Problem: Education: Goal: Knowledge of General Education information  will improve Description: Including pain rating scale, medication(s)/side effects and non-pharmacologic comfort measures Outcome: Progressing   Problem: Health Behavior/Discharge Planning: Goal: Ability to manage health-related needs will improve Outcome: Progressing   Problem: Clinical Measurements: Goal: Ability to maintain clinical measurements within normal limits will improve Outcome: Progressing Goal: Will remain free from infection Outcome: Progressing Goal: Diagnostic test results will improve Outcome: Progressing Goal: Respiratory complications will improve Outcome: Progressing Goal: Cardiovascular complication will be avoided Outcome: Progressing   Problem: Activity: Goal: Risk for activity intolerance will decrease Outcome: Progressing   Problem: Nutrition: Goal: Adequate nutrition will be maintained Outcome: Progressing   Problem: Coping: Goal: Level of anxiety will decrease Outcome: Progressing   Problem: Elimination: Goal: Will not experience complications related to bowel motility Outcome: Progressing Goal: Will not experience complications related to urinary retention Outcome: Progressing   Problem: Pain Managment: Goal: General experience of comfort will improve and/or be controlled Outcome: Progressing   Problem: Safety: Goal: Ability to remain free from injury will improve Outcome: Progressing   Problem: Skin Integrity: Goal: Risk for impaired skin integrity will decrease Outcome: Progressing

## 2024-05-17 NOTE — Progress Notes (Signed)
 Progress Note   Patient: Earl [name] FMW:[id number] DOB: February 09, 1941 DOA: 03/04/2024     74 DOS: the patient was seen and examined on 05/17/2024   Brief hospital course: 83 year old male with history of PAF on Eliquis , IDDM 2, HTN, CVA with right-sided residual weakness, MGUS, BPH, systolic CHF who is here with sacral pain. Of note, he was hospitalized 3/26-4/10 with generalized weakness/acute encephalopathy, hypotension, and was COVID PCR positive. He was eventually discharged to SNF and readmitted 4 days later with pain in the sacral area and shortness of breath. He was found to be in A-fib with RVR, tachypneic and was hypotensive on admission for which critical care was consulted. General surgery was consulted for sacral wound debridement status post bedside excision 4/15 and 03/07/2024.   Assessment and Plan:  Stage IV sacral decubitus wound/ulcer-repeat CT scan on 6/16 with some cortical irregularity along the underlying dorsal mid sacrum, concerning for osteomyelitis, findings appearing to be new.  Infectious disease MD reconsulted and Dr. Dea reviewed the case, did not recommend antibiotics but wound care alone, with plans for antibiotics if and when signs of infection - Dr. Waddell with plastic surgery evaluated patient 6/17, appears that he has a clean sacral decubitus, not a candidate for any type of flap closure.  Per his note, patient nor daughter were interested in surgery at this time.  Plastic surgery also recommends wound care - He most recently finished Fortaz  on 6/13, most recent wound culture done on 6/4 showed Pseudomonas and Klebsiella - Wound care reevaluated, discussed with PT, have been using Aquacel but it appears that on most recent dressing change it appears that it was not covering certain areas, discussed with wound care and change to Vashe, reevaluate this week, appreciate wound care follow-up   Severe sepsis-on admission, patient had lactic acidosis, white count,  fever secondary to infected chronic wound.  He has received multiple antibiotics during his long hospitalization.  Sepsis physiology has now resolved and he is currently afebrile, normotensive, not tachycardic.  White count is now stable   Acute on chronic pain-secondary to above, continue pain regimen with oxycodone  as below   Acute on chronic combined CHF, cardiac amyloidosis, nonischemic cardiomyopathy -on tafamidis  and vutrisiran  for amyloid.   -Goal-directed medical therapy somewhat limited by prior episodes of hypotension.  -Patient does appear to have mild dyspnea when talking and that is likely secondary to profound deconditioning and generalized weakness.   -Lasix  40 mg IV twice a day has been continued for fluid overload.  PAF-continue amiodarone  and Eliquis    Anemia of chronic disease-received a unit of packed red blood cells on 4/30.  Hemoglobin has remained stable, no bleeding   Elevated LFTs-mild, no nausea, no vomiting, no right upper quadrant pain.  Probably due to receiving Tylenol  around-the-clock, discussed with the bedside RN to hold Tylenol  dosing today   Dysphagia/severe protein calorie malnutrition-continue supplementation, pured diet   Physical debilitation, muscle weakness-PT/OT ongoing   Goals of care -palliative consulted as well, appears to be DNR.  Daughter wants him to transition at home once his wounds are better   IDDM -continue Semglee , sliding scale, Accu-Cheks       Subjective: Patient said he has a discomfort due to generalized aches otherwise no fever chills nausea vomiting or anything he complained at this point.  Physical Exam: Vitals:   05/17/24 0042 05/17/24 0359 05/17/24 0757 05/17/24 1159  BP: 137/75 130/73 (!) 129/90 (!) 139/94  Pulse: 68 69 81 82  Resp: 17 17 18  19  Temp: 97.8 F (36.6 C) 97.9 F (36.6 C) (!) 97.4 F (36.3 C) 97.8 F (36.6 C)  TempSrc:      SpO2: 100% 98% 99% 97%  Weight:      Height:       Constitutional:  Alert awake and communicative. Eyes: lids and conjunctivae normal, no scleral icterus ENMT: mmm Neck: normal, supple Respiratory: clear to auscultation bilaterally, no wheezing, no crackles. Normal respiratory effort.  Cardiovascular: Regular rate and rhythm, no murmurs / rubs / gallops. No LE edema. Abdomen: soft, no distention, no tenderness. Bowel sounds positive.     data Reviewed:  Potassium 3.3 on 6/26  Family Communication: No family was available today  Disposition: Status is: Inpatient Remains inpatient appropriate because: Sacral wound     Time spent: 35 minutes  Author: Nena Rebel, MD 05/17/2024 3:39 PM  For on call review [email or web address].

## 2024-05-18 DIAGNOSIS — A419 Sepsis, unspecified organism: Secondary | ICD-10-CM | POA: Diagnosis not present

## 2024-05-18 DIAGNOSIS — R6520 Severe sepsis without septic shock: Secondary | ICD-10-CM | POA: Diagnosis not present

## 2024-05-18 LAB — CBC
HCT: 33.1 % — ABNORMAL LOW (ref 39.0–52.0)
Hemoglobin: 10.8 g/dL — ABNORMAL LOW (ref 13.0–17.0)
MCH: 29 pg (ref 26.0–34.0)
MCHC: 32.6 g/dL (ref 30.0–36.0)
MCV: 88.7 fL (ref 80.0–100.0)
Platelets: 236 10*3/uL (ref 150–400)
RBC: 3.73 MIL/uL — ABNORMAL LOW (ref 4.22–5.81)
RDW: 18.6 % — ABNORMAL HIGH (ref 11.5–15.5)
WBC: 5.4 10*3/uL (ref 4.0–10.5)
nRBC: 0 % (ref 0.0–0.2)

## 2024-05-18 LAB — COMPREHENSIVE METABOLIC PANEL WITH GFR
ALT: 71 U/L — ABNORMAL HIGH (ref 0–44)
AST: 57 U/L — ABNORMAL HIGH (ref 15–41)
Albumin: 3.1 g/dL — ABNORMAL LOW (ref 3.5–5.0)
Alkaline Phosphatase: 122 U/L (ref 38–126)
Anion gap: 12 (ref 5–15)
BUN: 35 mg/dL — ABNORMAL HIGH (ref 8–23)
CO2: 25 mmol/L (ref 22–32)
Calcium: 8.6 mg/dL — ABNORMAL LOW (ref 8.9–10.3)
Chloride: 110 mmol/L (ref 98–111)
Creatinine, Ser: 0.93 mg/dL (ref 0.61–1.24)
GFR, Estimated: >60 mL/min (ref >60)
Glucose, Bld: 98 mg/dL (ref 70–99)
Potassium: 3.3 mmol/L — ABNORMAL LOW (ref 3.5–5.1)
Sodium: 147 mmol/L — ABNORMAL HIGH (ref 135–145)
Total Bilirubin: 1.3 mg/dL — ABNORMAL HIGH (ref 0.0–1.2)
Total Protein: 5.6 g/dL — ABNORMAL LOW (ref 6.5–8.1)

## 2024-05-18 LAB — GLUCOSE, CAPILLARY
Glucose-Capillary: 109 mg/dL — ABNORMAL HIGH (ref 70–99)
Glucose-Capillary: 176 mg/dL — ABNORMAL HIGH (ref 70–99)
Glucose-Capillary: 116 mg/dL — ABNORMAL HIGH (ref 70–99)
Glucose-Capillary: 157 mg/dL — ABNORMAL HIGH (ref 70–99)

## 2024-05-18 LAB — MAGNESIUM: Magnesium: 1.8 mg/dL (ref 1.7–2.4)

## 2024-05-18 MED ORDER — FUROSEMIDE 10 MG/ML IJ SOLN: 40.0000 mg | Freq: Two times a day (BID) | INTRAMUSCULAR | Status: DC

## 2024-05-18 NOTE — Plan of Care (Signed)
   Problem: Education: Goal: Knowledge of General Education information will improve Description: Including pain rating scale, medication(s)/side effects and non-pharmacologic comfort measures Outcome: Progressing   Problem: Clinical Measurements: Goal: Diagnostic test results will improve Outcome: Progressing   Problem: Nutrition: Goal: Adequate nutrition will be maintained Outcome: Progressing

## 2024-05-18 NOTE — Progress Notes (Signed)
 PROGRESS NOTE    Earl [name]  FMW:[id number] DOB: [birth year] DOA: 03/04/2024 PCP: [name], MD   Brief Narrative: 83 year old male with history of PAF on Eliquis , IDDM 2, HTN, CVA with right-sided residual weakness, MGUS, BPH, systolic CHF who is here with sacral pain. Of note, he was hospitalized 3/26-4/10 with generalized weakness/acute encephalopathy, hypotension, and was COVID PCR positive. He was eventually discharged to SNF and readmitted 4 days later with pain in the sacral area and shortness of breath. He was found to be in A-fib with RVR, tachypneic and was hypotensive on admission for which critical care was consulted. General surgery was consulted for sacral wound debridement status post bedside excision 4/15 and 03/07/2024.    Assessment and Plan:  Sacral decubitus ulcer, stage IV Infected ulcer Present on admission. Concern for infection on admission. Patient started empirically on vancomycin , cefepime , and Flagyl  with initial concern for osteomyelitis. ID consulted with recommendation to discontinue antibiotics.  Surgery consulted and performed wound debridement during this hospitalization.  Plastic surgery was also consulted  Severe sepsis Present on admission presumed secondary to sacral ulcer infection.  Patient managed with antibiotics.  Sepsis physiology resolved.  Acute on chronic pain -Continue oxycodone  as needed -Discontinue scheduled oxycodone  at night  Paroxysmal atrial fibrillation -Continue amiodarone , Eliquis   Acute on chronic combined heart failure Patient managed with Lasix  IV.  Patient is currently documented as being -27.7 L this admission.  Weight on admission documented to be 151 pounds with weight as high as 190.98 pounds this admission.  Weight has decreased from peak non to 175.99 pounds. -Hold Lasix  IV for tonight and repeat BMP in a.m. before resuming Lasix  -Continue Tafamidis  and vutrisiran   Left pleural effusion Patient  underwent left-sided thoracentesis on 6/18 yielding 600 mL of hazy amber fluid.  Body fluid culture with no growth.  Anemia of chronic disease Stable.  Elevated LFTs No liver pathology seen on CT imaging. Stable.  Hypernatremia Likely related to Lasix  diuresis and developing dehydration. -Hold Lasix  -Repeat BMP in AM  Dysphagia Speech therapy recommendations (5/29):  Diet recommendations: Thin liquid;Dysphagia 1 (puree) Liquids provided via: Cup;Straw Medication Administration: Crushed with puree Supervision: Staff to assist with self feeding Compensations: Small sips/bites;Slow rate Postural Changes and/or Swallow Maneuvers: Seated upright 90 degrees  Severe protein malnutrition Dietitian recommendations (6/25): Continue to encourage optimal nutritional intake Continue Magic cup TID with meals, each supplement provides 290 kcal and 9 grams of protein Continue Ensure Enlive po BID-One in the Afternoon and one in the evening, each supplement provides 350 kcal and 20 grams of protein. Continue vanilla Greek yogurt to evening meal tray  Continue Juven BID to support wound healing Continue MVI with minerals daily 500 mg vitamin C  BID 220 mg zinc  sulfate daily x 14 days 10,000 units vitamin A  daily x 30 days for supplementation (7.6 (03/06/24)).  Diabetes mellitus type 2 Poorly controlled with hyperglycemia based on hemoglobin A1C of 8.4%.  -Continue Semglee  and SSI  Pressure injury Left shoulder, left hip, left heel, right heel; all not present on admission. Right toe, unclear if present on admission.  DVT prophylaxis: Eliquis  Code Status:   Code Status: Limited: Do not attempt resuscitation (DNR) -DNR-LIMITED -Do Not Intubate/DNI  Family Communication: Daughter on telephone Disposition Plan: Discharge pending transition to outpatient diuresis and TOC   Consultants:    Procedures:    Antimicrobials:     Subjective: Patient reports some lightheadedness. No other  concerns.  Objective: BP 135/73 (BP Location: Left Arm)  Pulse 84   Temp (!) 97.5 F (36.4 C)   Resp 18   Ht 5' 11 (1.803 m)   Wt 79.8 kg   SpO2 99%   BMI 24.55 kg/m   Examination:  General exam: Appears calm and comfortable Respiratory system: Clear to auscultation. Respiratory effort normal. Cardiovascular system: S1 & S2 heard, RRR.SABRA Gastrointestinal system: Abdomen is nondistended, soft and nontender. Normal bowel sounds heard. Central nervous system: Alert and oriented.   Data Reviewed: I have personally reviewed following labs and imaging studies  CBC Lab Results  Component Value Date   WBC 5.4 05/18/2024   RBC 3.73 (L) 05/18/2024   HGB 10.8 (L) 05/18/2024   HCT 33.1 (L) 05/18/2024   MCV 88.7 05/18/2024   MCH 29.0 05/18/2024   PLT 236 05/18/2024   MCHC 32.6 05/18/2024   RDW 18.6 (H) 05/18/2024   LYMPHSABS 1.5 04/07/2024   MONOABS 0.7 04/07/2024   EOSABS 0.2 04/07/2024   BASOSABS 0.1 04/07/2024     Last metabolic panel Lab Results  Component Value Date   NA 147 (H) 05/18/2024   K 3.3 (L) 05/18/2024   CL 110 05/18/2024   CO2 25 05/18/2024   BUN 35 (H) 05/18/2024   CREATININE 0.93 05/18/2024   GLUCOSE 98 05/18/2024   GFRNONAA >60 05/18/2024   GFRAA 77 05/17/2021   CALCIUM  8.6 (L) 05/18/2024   PHOS 3.5 05/15/2024   PROT 5.6 (L) 05/18/2024   ALBUMIN  3.1 (L) 05/18/2024   LABGLOB 2.4 02/22/2024   BILITOT 1.3 (H) 05/18/2024   ALKPHOS 122 05/18/2024   AST 57 (H) 05/18/2024   ALT 71 (H) 05/18/2024   ANIONGAP 12 05/18/2024    GFR: Estimated Creatinine Clearance: 64.1 mL/min (by C-G formula based on SCr of 0.93 mg/dL).  No results found for this or any previous visit (from the past 240 hours).    Radiology Studies: No results found.    LOS: 75 days    Elgin Lam, MD [facility] 05/18/2024, 6:36 PM   If 7PM-7AM, please contact night-coverage [email or web address]

## 2024-05-19 DIAGNOSIS — R6520 Severe sepsis without septic shock: Secondary | ICD-10-CM | POA: Diagnosis not present

## 2024-05-19 DIAGNOSIS — A419 Sepsis, unspecified organism: Secondary | ICD-10-CM | POA: Diagnosis not present

## 2024-05-19 LAB — BASIC METABOLIC PANEL WITH GFR
Anion gap: 9 (ref 5–15)
BUN: 28 mg/dL — ABNORMAL HIGH (ref 8–23)
CO2: 27 mmol/L (ref 22–32)
Calcium: 8.4 mg/dL — ABNORMAL LOW (ref 8.9–10.3)
Chloride: 106 mmol/L (ref 98–111)
Creatinine, Ser: 0.95 mg/dL (ref 0.61–1.24)
GFR, Estimated: >60 mL/min (ref >60)
Glucose, Bld: 120 mg/dL — ABNORMAL HIGH (ref 70–99)
Potassium: 3.2 mmol/L — ABNORMAL LOW (ref 3.5–5.1)
Sodium: 142 mmol/L (ref 135–145)

## 2024-05-19 LAB — GLUCOSE, CAPILLARY
Glucose-Capillary: 121 mg/dL — ABNORMAL HIGH (ref 70–99)
Glucose-Capillary: 249 mg/dL — ABNORMAL HIGH (ref 70–99)
Glucose-Capillary: 184 mg/dL — ABNORMAL HIGH (ref 70–99)
Glucose-Capillary: 164 mg/dL — ABNORMAL HIGH (ref 70–99)

## 2024-05-19 LAB — CBC
HCT: 35.8 % — ABNORMAL LOW (ref 39.0–52.0)
Hemoglobin: 11.5 g/dL — ABNORMAL LOW (ref 13.0–17.0)
MCH: 28.7 pg (ref 26.0–34.0)
MCHC: 32.1 g/dL (ref 30.0–36.0)
MCV: 89.3 fL (ref 80.0–100.0)
Platelets: 249 10*3/uL (ref 150–400)
RBC: 4.01 MIL/uL — ABNORMAL LOW (ref 4.22–5.81)
RDW: 18.8 % — ABNORMAL HIGH (ref 11.5–15.5)
WBC: 5.1 10*3/uL (ref 4.0–10.5)
nRBC: 0 % (ref 0.0–0.2)

## 2024-05-19 LAB — MAGNESIUM: Magnesium: 2 mg/dL (ref 1.7–2.4)

## 2024-05-19 MED ORDER — POTASSIUM CHLORIDE CRYS ER 20 MEQ PO TBCR
40.0000 meq | EXTENDED_RELEASE_TABLET | Freq: Once | ORAL | Status: AC
  Administered 2024-05-19: 40 meq via ORAL
  Filled 2024-05-19: qty 2

## 2024-05-19 MED ORDER — POTASSIUM CHLORIDE CRYS ER 20 MEQ PO TBCR
40.0000 meq | EXTENDED_RELEASE_TABLET | Freq: Every day | ORAL | Status: DC
  Administered 2024-05-19 – 2024-05-26 (×8): 40 meq via ORAL
  Filled 2024-05-19 (×8): qty 2

## 2024-05-19 MED ORDER — FUROSEMIDE 10 MG/ML IJ SOLN
40.0000 mg | Freq: Every day | INTRAMUSCULAR | Status: DC
  Administered 2024-05-19 – 2024-05-26 (×8): 40 mg via INTRAVENOUS
  Filled 2024-05-19 (×8): qty 4

## 2024-05-19 MED ORDER — ORAL CARE MOUTH RINSE
15.0000 mL | OROMUCOSAL | Status: DC
Start: 2024-05-20 — End: 2024-05-28
  Administered 2024-05-20 – 2024-05-28 (×31): 15 mL via OROMUCOSAL

## 2024-05-19 MED ORDER — ORAL CARE MOUTH RINSE: 15.0000 mL | OROMUCOSAL | Status: DC | PRN

## 2024-05-19 NOTE — Plan of Care (Signed)
  Problem: Fluid Volume: Goal: Hemodynamic stability will improve Outcome: Progressing   Problem: Clinical Measurements: Goal: Diagnostic test results will improve Outcome: Progressing Goal: Signs and symptoms of infection will decrease Outcome: Progressing   Problem: Respiratory: Goal: Ability to maintain adequate ventilation will improve Outcome: Progressing   Problem: Education: Goal: Knowledge of risk factors and measures for prevention of condition will improve Outcome: Progressing   Problem: Coping: Goal: Psychosocial and spiritual needs will be supported Outcome: Progressing   Problem: Respiratory: Goal: Will maintain a patent airway Outcome: Progressing Goal: Complications related to the disease process, condition or treatment will be avoided or minimized Outcome: Progressing   Problem: Education: Goal: Ability to describe self-care measures that may prevent or decrease complications (Diabetes Survival Skills Education) will improve Outcome: Progressing   Problem: Coping: Goal: Ability to adjust to condition or change in health will improve Outcome: Progressing   Problem: Fluid Volume: Goal: Ability to maintain a balanced intake and output will improve Outcome: Progressing   Problem: Health Behavior/Discharge Planning: Goal: Ability to manage health-related needs will improve Outcome: Progressing   Problem: Metabolic: Goal: Ability to maintain appropriate glucose levels will improve Outcome: Progressing   Problem: Nutritional: Goal: Maintenance of adequate nutrition will improve Outcome: Progressing Goal: Progress toward achieving an optimal weight will improve Outcome: Progressing   Problem: Skin Integrity: Goal: Risk for impaired skin integrity will decrease Outcome: Progressing   Problem: Tissue Perfusion: Goal: Adequacy of tissue perfusion will improve Outcome: Progressing   Problem: Education: Goal: Knowledge of General Education information  will improve Description: Including pain rating scale, medication(s)/side effects and non-pharmacologic comfort measures Outcome: Progressing   Problem: Health Behavior/Discharge Planning: Goal: Ability to manage health-related needs will improve Outcome: Progressing   Problem: Clinical Measurements: Goal: Ability to maintain clinical measurements within normal limits will improve Outcome: Progressing Goal: Will remain free from infection Outcome: Progressing Goal: Diagnostic test results will improve Outcome: Progressing Goal: Respiratory complications will improve Outcome: Progressing Goal: Cardiovascular complication will be avoided Outcome: Progressing   Problem: Activity: Goal: Risk for activity intolerance will decrease Outcome: Progressing   Problem: Nutrition: Goal: Adequate nutrition will be maintained Outcome: Progressing   Problem: Coping: Goal: Level of anxiety will decrease Outcome: Progressing   Problem: Elimination: Goal: Will not experience complications related to bowel motility Outcome: Progressing Goal: Will not experience complications related to urinary retention Outcome: Progressing   Problem: Pain Managment: Goal: General experience of comfort will improve and/or be controlled Outcome: Progressing   Problem: Safety: Goal: Ability to remain free from injury will improve Outcome: Progressing   Problem: Skin Integrity: Goal: Risk for impaired skin integrity will decrease Outcome: Progressing

## 2024-05-19 NOTE — Progress Notes (Signed)
 PROGRESS NOTE    Earl [name]  FMW:[id number] DOB: Dec 06, 1940 DOA: 03/04/2024 PCP: [name], MD   Brief Narrative: 83 year old male with history of PAF on Eliquis , IDDM 2, HTN, CVA with right-sided residual weakness, MGUS, BPH, systolic CHF who is here with sacral pain. Of note, he was hospitalized 3/26-4/10 with generalized weakness/acute encephalopathy, hypotension, and was COVID PCR positive. He was eventually discharged to SNF and readmitted 4 days later with pain in the sacral area and shortness of breath. He was found to be in A-fib with RVR, tachypneic and was hypotensive on admission for which critical care was consulted. General surgery was consulted for sacral wound debridement status post bedside excision 4/15 and 03/07/2024.    Assessment and Plan:  Sacral decubitus ulcer, stage IV Infected ulcer Present on admission. Concern for infection on admission. Patient started empirically on vancomycin , cefepime , and Flagyl . CT on 6/16 concern for possible osteomyelitis. ID consulted with recommendation to discontinue antibiotics.  Surgery consulted and performed wound debridement during this hospitalization.  Plastic surgery was also consulted with recommendation for continued wound care and no surgical management. -Continue wound care -Continue pressure relieving therapies -Continue PT/OT  Severe sepsis Present on admission presumed secondary to sacral ulcer infection.  Patient managed with antibiotics.  Sepsis physiology resolved.  Acute on chronic pain Discontinue scheduled oxycodone  at night. -Continue oxycodone  as needed -Continue Tylenol  as needed -Discontinue scheduled Tylenol  after discussion with daughter (patient is rarely using at this time anyway)  Paroxysmal atrial fibrillation -Continue amiodarone , Eliquis   Acute on chronic combined heart failure Anasarca Patient managed with Lasix  IV.  Patient is currently documented as being -27.7 L this  admission.  Weight on admission documented to be 151 pounds with weight as high as 191 pounds this admission.  Weight has decreased from peak non to 175.99 pounds. -Resume Lasix  IV as a daily dose; increase if poor urine output -Continue Tafamidis  and vutrisiran   Left pleural effusion Patient underwent left-sided thoracentesis on 6/18 yielding 600 mL of hazy amber fluid.  Body fluid culture with no growth.  Anemia of chronic disease Stable.  Elevated LFTs No liver pathology seen on CT imaging. Stable.  Hypernatremia Likely related to Lasix  diuresis and developing dehydration. Resolved quickly.  Dysphagia Speech therapy recommendations (5/29):  Diet recommendations: Thin liquid;Dysphagia 1 (puree) Liquids provided via: Cup;Straw Medication Administration: Crushed with puree Supervision: Staff to assist with self feeding Compensations: Small sips/bites;Slow rate Postural Changes and/or Swallow Maneuvers: Seated upright 90 degrees  Severe protein malnutrition Dietitian recommendations (6/25): Continue to encourage optimal nutritional intake Continue Magic cup TID with meals, each supplement provides 290 kcal and 9 grams of protein Continue Ensure Enlive po BID-One in the Afternoon and one in the evening, each supplement provides 350 kcal and 20 grams of protein. Continue vanilla Greek yogurt to evening meal tray  Continue Juven BID to support wound healing Continue MVI with minerals daily 500 mg vitamin C  BID 220 mg zinc  sulfate daily x 14 days 10,000 units vitamin A  daily x 30 days for supplementation (7.6 (03/06/24)).  Diabetes mellitus type 2 Poorly controlled with hyperglycemia based on hemoglobin A1C of 8.4%.  -Continue Semglee  and SSI  Pressure injury Left shoulder, left hip, left heel, right heel; all not present on admission. Right toe, unclear if present on admission.  DVT prophylaxis: Eliquis  Code Status:   Code Status: Limited: Do not attempt resuscitation (DNR)  -DNR-LIMITED -Do Not Intubate/DNI  Family Communication: Daughter on telephone Disposition Plan: Discharge pending transition to outpatient  diuresis and TOC   Consultants:  Palliative care Neurology Cardiology PCCM Infectious disease Plastic surgery General surgery  Procedures:  Excisional debridement Left thoracentesis  Antimicrobials:     Subjective: No specific issues from overnight.  Objective: BP 135/75 (BP Location: Right Arm)   Pulse 74   Temp (!) 97.4 F (36.3 C)   Resp 19   Ht 5' 11 (1.803 m)   Wt 79.8 kg   SpO2 99%   BMI 24.55 kg/m   Examination:  General exam: Appears calm and comfortable Respiratory system: Clear to auscultation. Respiratory effort normal. Cardiovascular system: S1 & S2 heard, RRR. Gastrointestinal system: Abdomen is soft. Dependent pitting edema noted on bilateral flank. Normal bowel sounds heard. Central nervous system: Alert and oriented. No focal neurological deficits. Musculoskeletal: Significant pitting edema noted in thighs and hip. No calf tenderness Skin: No cyanosis. No rashes Psychiatry: Judgement and insight appear normal. Mood & affect appropriate.    Data Reviewed: I have personally reviewed following labs and imaging studies  CBC Lab Results  Component Value Date   WBC 5.1 05/19/2024   RBC 4.01 (L) 05/19/2024   HGB 11.5 (L) 05/19/2024   HCT 35.8 (L) 05/19/2024   MCV 89.3 05/19/2024   MCH 28.7 05/19/2024   PLT 249 05/19/2024   MCHC 32.1 05/19/2024   RDW 18.8 (H) 05/19/2024   LYMPHSABS 1.5 04/07/2024   MONOABS 0.7 04/07/2024   EOSABS 0.2 04/07/2024   BASOSABS 0.1 04/07/2024     Last metabolic panel Lab Results  Component Value Date   NA 142 05/19/2024   K 3.2 (L) 05/19/2024   CL 106 05/19/2024   CO2 27 05/19/2024   BUN 28 (H) 05/19/2024   CREATININE 0.95 05/19/2024   GLUCOSE 120 (H) 05/19/2024   GFRNONAA >60 05/19/2024   GFRAA 77 05/17/2021   CALCIUM  8.4 (L) 05/19/2024   PHOS 3.5 05/15/2024    PROT 5.6 (L) 05/18/2024   ALBUMIN  3.1 (L) 05/18/2024   LABGLOB 2.4 02/22/2024   BILITOT 1.3 (H) 05/18/2024   ALKPHOS 122 05/18/2024   AST 57 (H) 05/18/2024   ALT 71 (H) 05/18/2024   ANIONGAP 9 05/19/2024    GFR: Estimated Creatinine Clearance: 62.8 mL/min (by C-G formula based on SCr of 0.95 mg/dL).  No results found for this or any previous visit (from the past 240 hours).    Radiology Studies: No results found.    LOS: 76 days    Elgin Lam, MD [facility] 05/19/2024, 1:57 PM   If 7PM-7AM, please contact night-coverage [email or web address]

## 2024-05-19 NOTE — Plan of Care (Signed)
   Problem: Nutritional: Goal: Maintenance of adequate nutrition will improve Outcome: Progressing   Problem: Skin Integrity: Goal: Risk for impaired skin integrity will decrease Outcome: Progressing

## 2024-05-20 DIAGNOSIS — K219 Gastro-esophageal reflux disease without esophagitis: Secondary | ICD-10-CM | POA: Diagnosis not present

## 2024-05-20 DIAGNOSIS — I480 Paroxysmal atrial fibrillation: Secondary | ICD-10-CM | POA: Diagnosis not present

## 2024-05-20 DIAGNOSIS — A419 Sepsis, unspecified organism: Secondary | ICD-10-CM | POA: Diagnosis not present

## 2024-05-20 DIAGNOSIS — L98429 Non-pressure chronic ulcer of back with unspecified severity: Secondary | ICD-10-CM | POA: Diagnosis not present

## 2024-05-20 LAB — GLUCOSE, CAPILLARY
Glucose-Capillary: 139 mg/dL — ABNORMAL HIGH (ref 70–99)
Glucose-Capillary: 200 mg/dL — ABNORMAL HIGH (ref 70–99)
Glucose-Capillary: 224 mg/dL — ABNORMAL HIGH (ref 70–99)
Glucose-Capillary: 143 mg/dL — ABNORMAL HIGH (ref 70–99)

## 2024-05-20 MED ORDER — DAKINS (1/4 STRENGTH) 0.125 % EX SOLN
Freq: Every day | CUTANEOUS | Status: AC
  Filled 2024-05-20: qty 473

## 2024-05-20 NOTE — Progress Notes (Signed)
 Physical Therapy Treatment Patient Details Name: Earl [name] MRN: [id number] DOB: [birth year] Today's Date: 05/20/2024   History of Present Illness 83 y.o. male admitted from [facility] SN[facility] rehab on 03/04/24 with SOB, sacral pain, Afib with RVR. Pt with acute metabolic encephalopathy, rhabdomyolysis and sepsis secondary to sacral wound infection. 4/15 & 4/17 bedside sacral wound debridement. PMhx: adm 3/26-4/08/2024 with fall, AMS, COVID; d/c to SNF rehab. CVA (2021, residual R-side weakness), T2DM, HTN, CHF, MGUS, Afib on Eliquis .    PT Comments  Patient initially fatigued and not wanting to do more than bed level therapy.  His daughter arrived and insisted him get up if he wants to be able to go home.  Able to stand with +2 mod A to Stedy then transition to recliner.  His vitals were stable and he seemed more comfortable for a few minutes.  RN informed on mobility status for back to bed.  Nursing secretary to order geomat for chair.     If plan is discharge home, recommend the following: Two people to help with walking and/or transfers;Two people to help with bathing/dressing/bathroom;Assistance with feeding;Assist for transportation   Can travel by private vehicle     No  Equipment Recommendations  Hospital bed;Hoyer lift;Wheelchair cushion (measurements PT);Wheelchair (measurements PT)    Recommendations for Other Services       Precautions / Restrictions Precautions Precautions: Fall Recall of Precautions/Restrictions: Impaired Precaution/Restrictions Comments: sacral wound     Mobility  Bed Mobility Overal bed mobility: Needs Assistance Bed Mobility: Rolling Rolling: Max assist, Mod assist   Supine to sit: Mod assist, HOB elevated, Used rails, +2 for physical assistance     General bed mobility comments: assist for guiding legs off EOB, assist to lift trunk and scoot hips    Transfers Overall transfer level: Needs assistance Equipment used: Ambulation equipment  used Transfers: Sit to/from Stand, Bed to chair/wheelchair/BSC Sit to Stand: Mod assist, +2 physical assistance, From elevated surface, Via lift equipment Stand pivot transfers: Total assist         General transfer comment: up to stand from elevated bed to [facility] with cues and A for lifting, to bed via Stedy and positioned for comfort    Ambulation/Gait                   Stairs             Wheelchair Mobility     Tilt Bed    Modified Rankin (Stroke Patients Only)       Balance Overall balance assessment: Needs assistance Sitting-balance support: Feet supported Sitting balance-Leahy Scale: Poor Sitting balance - Comments: UE support on Stedy for balance or on rail on bed; leans R and posterior Postural control: Right lateral lean, Posterior lean Standing balance support: Bilateral upper extremity supported, Reliant on assistive device for balance Standing balance-Leahy Scale: Zero Standing balance comment: 2 A to stand briefly in [place]                            Communication Communication Factors Affecting Communication: Reduced clarity of speech;Difficulty expressing self  Cognition Arousal: Alert Behavior During Therapy: WFL for tasks assessed/performed   PT - Cognitive impairments: Problem solving, Safety/Judgement                       PT - Cognition Comments: slow processsing, initial refusal, agreed with daughter's encouragement Following commands: Impaired Following commands impaired: Follows one  step commands inconsistently, Follows one step commands with increased time    Cueing Cueing Techniques: Verbal cues, Gestural cues, Tactile cues, Visual cues  Exercises      General Comments General comments (skin integrity, edema, etc.): daughter present and supportive, educated per her request how to get Earl [name] to use his diaphragm more to improve speech quality and volume      Pertinent Vitals/Pain Pain Assessment Faces  Pain Scale: Hurts little more Pain Location: L arm using it to stand Pain Descriptors / Indicators: Tender, Sore Pain Intervention(s): Monitored during session, Repositioned, Limited activity within patient's tolerance    Home Living                          Prior Function            PT Goals (current goals can now be found in the care plan section) Progress towards PT goals: Progressing toward goals    Frequency    Min 1X/week      PT Plan      Co-evaluation              AM-PAC PT 6 Clicks Mobility   Outcome Measure  Help needed turning from your back to your side while in a flat bed without using bedrails?: A Lot Help needed moving from lying on your back to sitting on the side of a flat bed without using bedrails?: A Lot Help needed moving to and from a bed to a chair (including a wheelchair)?: Total Help needed standing up from a chair using your arms (e.g., wheelchair or bedside chair)?: Total Help needed to walk in hospital room?: Total Help needed climbing 3-5 steps with a railing? : Total 6 Click Score: 8    End of Session Equipment Utilized During Treatment: Gait belt Activity Tolerance: Patient limited by fatigue Patient left: in chair;with call bell/phone within reach;with chair alarm set;with family/visitor present Nurse Communication: Mobility status;Need for lift equipment PT Visit Diagnosis: Muscle weakness (generalized) (M62.81);Adult, failure to thrive (R62.7);Pain;Other abnormalities of gait and mobility (R26.89) Pain - Right/Left: Left Pain - part of body:  (bottom)     Time: 1330-1403 PT Time Calculation (min) (ACUTE ONLY): 33 min  Charges:    $Therapeutic Activity: 23-37 mins PT General Charges $$ ACUTE PT VISIT: 1 Visit                     Micheline [name], PT Acute Rehabilitation Services Office:[phone] 05/20/2024    Earl [name] 05/20/2024, 4:43 PM

## 2024-05-20 NOTE — Plan of Care (Signed)
   Problem: Fluid Volume: Goal: Hemodynamic stability will improve Outcome: Progressing

## 2024-05-20 NOTE — Progress Notes (Signed)
 Progress Note   Patient: Earl [name] FMW:[id number] DOB: 14-Mar-1941 DOA: 03/04/2024     77 DOS: the patient was seen and examined on 05/20/2024   Brief hospital course: 83 year old male with history of PAF on Eliquis , IDDM 2, HTN, CVA with right-sided residual weakness, MGUS, BPH, systolic CHF who is here with sacral pain. Of note, he was hospitalized 3/26-4/10 with generalized weakness/acute encephalopathy, hypotension, and was COVID PCR positive. He was eventually discharged to SNF and readmitted 4 days later with pain in the sacral area and shortness of breath. He was found to be in A-fib with RVR, tachypneic and was hypotensive on admission for which critical care was consulted. General surgery was consulted for sacral wound debridement status post bedside excision 4/15 and 03/07/2024.   Assessment and Plan: Sacral decubitus ulcer, stage IV Infected ulcer Present on admission. Concern for infection on admission. Patient started empirically on vancomycin , cefepime , and Flagyl . CT on 6/16 concern for possible osteomyelitis. ID consulted with recommendation to discontinue antibiotics.  Surgery consulted and performed wound debridement during this hospitalization.  Plastic surgery was also consulted with recommendation for continued wound care and no surgical management. Continue current wound care Continue pressure relieving therapies Advised to work with PT/OT   Severe sepsis Present on admission presumed secondary to sacral ulcer infection.  Patient managed with antibiotics.  Sepsis physiology resolved.   Acute on chronic pain Discontinue scheduled oxycodone  at night. Continue oxycodone  as needed Continue Tylenol  as needed Discontinue scheduled Tylenol  after discussion with daughter (patient is rarely using at this time anyway)   Paroxysmal atrial fibrillation Continue amiodarone , Eliquis    Acute on chronic combined heart failure Anasarca He does have abdominal and thigh  swelling. Resumed Lasix  IV as a daily dose; increase if poor urine output Continue Tafamidis  and vutrisiran    Left pleural effusion Patient underwent left-sided thoracentesis on 6/18 yielded 600 mL of hazy amber fluid.  Body fluid culture with no growth.   Anemia of chronic disease Stable. Hb 11.   Elevated LFTs No liver pathology seen on CT imaging. Stable.   Hypernatremia Improved. Likely related to Lasix  diuresis and developing dehydration.    Dysphagia Speech therapy recommendations (5/29):  Diet recommendations: Thin liquid;Dysphagia 1 (puree)   Severe protein malnutrition Continue dietician recommendations, encourage diet, supplements.   Diabetes mellitus type 2 Poorly controlled with hyperglycemia based on hemoglobin A1C of 8.4%.  Continue Semglee  and SSI   Pressure injury Left shoulder, left hip, left heel, right heel; all not present on admission. Right toe, unclear if present on admission.     Out of bed to chair. Incentive spirometry. Nursing supportive care. Fall, aspiration precautions. Diet:  Diet Orders (From admission, onward)     Start     Ordered   04/30/24 1436  DIET - DYS 1 Room service appropriate? No; Fluid consistency: Thin  Diet effective now       Question Answer Comment  Room service appropriate? No   Fluid consistency: Thin      04/30/24 1436           DVT prophylaxis: SCDs Start: 03/04/24 2151 Place TED hose Start: 03/04/24 2151 apixaban  (ELIQUIS ) tablet 5 mg  Level of care: Telemetry Medical   Code Status: Limited: Do not attempt resuscitation (DNR) -DNR-LIMITED -Do Not Intubate/DNI   Subjective: Patient is seen and examined today morning. He is lying in bed, talks slowly. Has abdominal distention, discomfort. No family at bedside.  Physical Exam: Vitals:   05/19/24 2050 05/20/24 9562 05/20/24  0755 05/20/24 1148  BP: 125/74 119/66 121/83 (!) 131/90  Pulse: 85 67 83 80  Resp:   16 17  Temp: 97.8 F (36.6 C) 97.6 F (36.4  C) (!) 97.4 F (36.3 C) (!) 97.5 F (36.4 C)  TempSrc: Oral Oral    SpO2: 100% 100% 100% 99%  Weight:      Height:        General - Elderly African American ill male, mild respiratory distress HEENT - PERRLA, EOMI, atraumatic head, non tender sinuses. Lung - Clear, basal rales, rhonchi, wheezes. Heart - S1, S2 heard, no murmurs, rubs, pitting edema thigh, abdomen. Abdomen - Soft, non tender, distended, bowel sounds good Neuro - Alert, awake and oriented, non focal exam. Skin - Warm and dry.  Data Reviewed:      Latest Ref Rng & Units 05/19/2024    5:44 AM 05/18/2024    4:42 AM 05/15/2024    2:02 PM  CBC  WBC 4.0 - 10.5 K/uL 5.1  5.4  5.7   Hemoglobin 13.0 - 17.0 g/dL 88.4  89.1  88.6   Hematocrit 39.0 - 52.0 % 35.8  33.1  34.5   Platelets 150 - 400 K/uL 249  236  241       Latest Ref Rng & Units 05/19/2024    5:44 AM 05/18/2024    4:42 AM 05/16/2024   12:42 PM  BMP  Glucose 70 - 99 mg/dL 879  98  734   BUN 8 - 23 mg/dL 28  35  40   Creatinine 0.61 - 1.24 mg/dL 9.04  9.06  8.92   Sodium 135 - 145 mmol/L 142  147  143   Potassium 3.5 - 5.1 mmol/L 3.2  3.3  3.3   Chloride 98 - 111 mmol/L 106  110  105   CO2 22 - 32 mmol/L 27  25  22    Calcium  8.9 - 10.3 mg/dL 8.4  8.6  8.7    No results found.  Family Communication: Discussed with patient, understand and agree. All questions answered.  Disposition: Status is: Inpatient Remains inpatient appropriate because: placement, wound care.  Planned Discharge Destination: Skilled nursing facility     Time spent: 40 minutes  Author: Concepcion Riser, MD 05/20/2024 8:20 PM Secure chat 7am to 7pm For on call review [email or web address].

## 2024-05-20 NOTE — Plan of Care (Signed)
  Problem: Fluid Volume: Goal: Hemodynamic stability will improve Outcome: Progressing   Problem: Clinical Measurements: Goal: Diagnostic test results will improve Outcome: Progressing Goal: Signs and symptoms of infection will decrease Outcome: Progressing   Problem: Respiratory: Goal: Ability to maintain adequate ventilation will improve Outcome: Progressing   Problem: Education: Goal: Knowledge of risk factors and measures for prevention of condition will improve Outcome: Progressing   Problem: Coping: Goal: Psychosocial and spiritual needs will be supported Outcome: Progressing   Problem: Respiratory: Goal: Will maintain a patent airway Outcome: Progressing Goal: Complications related to the disease process, condition or treatment will be avoided or minimized Outcome: Progressing   Problem: Education: Goal: Ability to describe self-care measures that may prevent or decrease complications (Diabetes Survival Skills Education) will improve Outcome: Progressing   Problem: Coping: Goal: Ability to adjust to condition or change in health will improve Outcome: Progressing   Problem: Fluid Volume: Goal: Ability to maintain a balanced intake and output will improve Outcome: Progressing   Problem: Health Behavior/Discharge Planning: Goal: Ability to manage health-related needs will improve Outcome: Progressing   Problem: Metabolic: Goal: Ability to maintain appropriate glucose levels will improve Outcome: Progressing   Problem: Nutritional: Goal: Maintenance of adequate nutrition will improve Outcome: Progressing Goal: Progress toward achieving an optimal weight will improve Outcome: Progressing   Problem: Skin Integrity: Goal: Risk for impaired skin integrity will decrease Outcome: Progressing   Problem: Tissue Perfusion: Goal: Adequacy of tissue perfusion will improve Outcome: Progressing   Problem: Education: Goal: Knowledge of General Education information  will improve Description: Including pain rating scale, medication(s)/side effects and non-pharmacologic comfort measures Outcome: Progressing   Problem: Health Behavior/Discharge Planning: Goal: Ability to manage health-related needs will improve Outcome: Progressing   Problem: Clinical Measurements: Goal: Ability to maintain clinical measurements within normal limits will improve Outcome: Progressing Goal: Will remain free from infection Outcome: Progressing Goal: Diagnostic test results will improve Outcome: Progressing Goal: Respiratory complications will improve Outcome: Progressing Goal: Cardiovascular complication will be avoided Outcome: Progressing   Problem: Activity: Goal: Risk for activity intolerance will decrease Outcome: Progressing   Problem: Nutrition: Goal: Adequate nutrition will be maintained Outcome: Progressing   Problem: Coping: Goal: Level of anxiety will decrease Outcome: Progressing   Problem: Elimination: Goal: Will not experience complications related to bowel motility Outcome: Progressing Goal: Will not experience complications related to urinary retention Outcome: Progressing   Problem: Pain Managment: Goal: General experience of comfort will improve and/or be controlled Outcome: Progressing   Problem: Safety: Goal: Ability to remain free from injury will improve Outcome: Progressing   Problem: Skin Integrity: Goal: Risk for impaired skin integrity will decrease Outcome: Progressing

## 2024-05-20 NOTE — Plan of Care (Signed)
   Medical records reviewed including progress notes, labs and imaging.  Per MAR, he has had 2 dose of as needed Atarax  in the past 24 hours.  Remains on Zoloft  and bedtime Oxycodone  has been discontinued which is appropriate given goal to minimize opioid use.  Goals of care are clear at this time for medical optimization and return home to continue managing chronic illnesses.  PMT will continue to follow peripherally and remains available for additional palliative needs.  Thank you for your referral and allowing PMT to assist in [name]'s care.   Earl Rust, PA-C Palliative Medicine Team  Team Phone # [phone]   NO CHARGE

## 2024-05-20 NOTE — Consult Note (Signed)
 WOC Nurse wound follow up   WOC team presented to bedside for follow-up on wounds, however patient is actively working with PT at time of visit.  Will plan to see tomorrow with PT(hydrotherapy) for follow-up wound evaluation.  Thank you, Doyal Polite, RN, MSN, [facility] WOC Team

## 2024-05-21 DIAGNOSIS — I480 Paroxysmal atrial fibrillation: Secondary | ICD-10-CM | POA: Diagnosis not present

## 2024-05-21 DIAGNOSIS — A419 Sepsis, unspecified organism: Secondary | ICD-10-CM | POA: Diagnosis not present

## 2024-05-21 DIAGNOSIS — L98429 Non-pressure chronic ulcer of back with unspecified severity: Secondary | ICD-10-CM | POA: Diagnosis not present

## 2024-05-21 DIAGNOSIS — K219 Gastro-esophageal reflux disease without esophagitis: Secondary | ICD-10-CM | POA: Diagnosis not present

## 2024-05-21 LAB — GLUCOSE, CAPILLARY
Glucose-Capillary: 87 mg/dL (ref 70–99)
Glucose-Capillary: 170 mg/dL — ABNORMAL HIGH (ref 70–99)
Glucose-Capillary: 206 mg/dL — ABNORMAL HIGH (ref 70–99)
Glucose-Capillary: 216 mg/dL — ABNORMAL HIGH (ref 70–99)

## 2024-05-21 MED ORDER — DAKINS (1/4 STRENGTH) 0.125 % EX SOLN
Freq: Every day | CUTANEOUS | Status: AC
  Filled 2024-05-21 (×2): qty 473

## 2024-05-21 NOTE — Progress Notes (Signed)
 Progress Note   Patient: Earl [name] FMW:[id number] DOB: [birth year] DOA: 03/04/2024     78 DOS: the patient was seen and examined on 05/21/2024   Brief hospital course: 83 year old male with history of PAF on Eliquis , IDDM 2, HTN, CVA with right-sided residual weakness, MGUS, BPH, systolic CHF who is here with sacral pain. Of note, he was hospitalized 3/26-4/10 with generalized weakness/acute encephalopathy, hypotension, and was COVID PCR positive. He was eventually discharged to SNF and readmitted 4 days later with pain in the sacral area and shortness of breath. He was found to be in A-fib with RVR, tachypneic and was hypotensive on admission for which critical care was consulted. General surgery was consulted for sacral wound debridement status post bedside excision 4/15 and 03/07/2024.   Assessment and Plan: Sacral decubitus ulcer, stage IV Infected ulcer Present on admission. Concern for infection on admission. Patient started empirically on vancomycin , cefepime , and Flagyl . CT on 6/16 concern for possible osteomyelitis. ID consulted with recommendation to discontinue antibiotics.  Surgery consulted and performed wound debridement during this hospitalization.  Plastic surgery was also consulted with recommendation for continued wound care and no surgical management. Continue current wound care Continue pressure relieving therapies Advised to work with PT/OT   Severe sepsis Present on admission presumed secondary to sacral ulcer infection.  Patient managed with antibiotics.  Sepsis physiology resolved.   Acute on chronic pain Discontinue scheduled oxycodone  at night. Continue oxycodone  as needed Continue Tylenol  as needed. LFT up noted.   Paroxysmal atrial fibrillation Continue amiodarone , Eliquis    Acute on chronic combined heart failure Anasarca He does have abdominal and thigh swelling. Resumed Lasix  IV as a daily dose; increase if poor urine output Continue Tafamidis  and  vutrisiran    Left pleural effusion Patient underwent left-sided thoracentesis on 6/18 yielded 600 mL of hazy amber fluid.  Body fluid culture with no growth.   Anemia of chronic disease Stable. Hb 11.   Elevated LFTs No liver pathology seen on CT imaging. Stable.   Hypernatremia Improved. Likely related to Lasix  diuresis and developing dehydration.    Dysphagia Speech therapy recommendations (5/29):  Diet recommendations: Thin liquid;Dysphagia 1 (puree) SLP for cognition/ speech pending.   Severe protein malnutrition Continue dietician recommendations, encourage diet, supplements.   Diabetes mellitus type 2 Poorly controlled with hyperglycemia based on hemoglobin A1C of 8.4%.  Continue Semglee  and SSI. Daughter asked about psychiatry eval for depression.   Pressure injury Left shoulder, left hip, left heel, right heel; all not present on admission. Right toe, unclear if present on admission.     Out of bed to chair. Incentive spirometry. Nursing supportive care. Fall, aspiration precautions. Diet:  Diet Orders (From admission, onward)     Start     Ordered   04/30/24 1436  DIET - DYS 1 Room service appropriate? No; Fluid consistency: Thin  Diet effective now       Question Answer Comment  Room service appropriate? No   Fluid consistency: Thin      04/30/24 1436           DVT prophylaxis: SCDs Start: 03/04/24 2151 Place TED hose Start: 03/04/24 2151 apixaban  (ELIQUIS ) tablet 5 mg  Level of care: Telemetry Medical   Code Status: Limited: Do not attempt resuscitation (DNR) -DNR-LIMITED -Do Not Intubate/DNI   Subjective: Patient is seen and examined today morning. He is lying in bed, talks slowly. No family at bedside.  Physical Exam: Vitals:   05/20/24 2039 05/21/24 0023 05/21/24 9560 05/21/24 9155  BP: 130/89 115/69 110/73 118/67  Pulse: 85 81 81 83  Resp: 18 18 17 19   Temp: 97.6 F (36.4 C) (!) 97.5 F (36.4 C) (!) 97.4 F (36.3 C) 97.9 F (36.6 C)   TempSrc:      SpO2: 96% 97% 95% 100%  Weight:      Height:        General - Elderly African American ill male, mild respiratory distress HEENT - PERRLA, EOMI, atraumatic head, non tender sinuses. Lung - Clear, basal rales, rhonchi, wheezes. Heart - S1, S2 heard, no murmurs, rubs, pitting edema thigh, abdomen. Abdomen - Soft, non tender, distended, bowel sounds good Neuro - Alert, awake and oriented, non focal exam. Skin - Warm and dry.  Data Reviewed:      Latest Ref Rng & Units 05/19/2024    5:44 AM 05/18/2024    4:42 AM 05/15/2024    2:02 PM  CBC  WBC 4.0 - 10.5 K/uL 5.1  5.4  5.7   Hemoglobin 13.0 - 17.0 g/dL 88.4  89.1  88.6   Hematocrit 39.0 - 52.0 % 35.8  33.1  34.5   Platelets 150 - 400 K/uL 249  236  241       Latest Ref Rng & Units 05/19/2024    5:44 AM 05/18/2024    4:42 AM 05/16/2024   12:42 PM  BMP  Glucose 70 - 99 mg/dL 879  98  734   BUN 8 - 23 mg/dL 28  35  40   Creatinine 0.61 - 1.24 mg/dL 9.04  9.06  8.92   Sodium 135 - 145 mmol/L 142  147  143   Potassium 3.5 - 5.1 mmol/L 3.2  3.3  3.3   Chloride 98 - 111 mmol/L 106  110  105   CO2 22 - 32 mmol/L 27  25  22    Calcium  8.9 - 10.3 mg/dL 8.4  8.6  8.7    No results found.  Family Communication: Discussed with patient, daughter over phone. They understand and agree. All questions answered.  Disposition: Status is: Inpatient Remains inpatient appropriate because: placement, wound care.  Planned Discharge Destination: Skilled nursing facility     Time spent: 38 minutes  Author: Concepcion Riser, MD 05/21/2024 2:32 PM Secure chat 7am to 7pm For on call review [email or web address].

## 2024-05-21 NOTE — Consult Note (Signed)
 WOC Nurse wound follow up Wound type: Left hip: unstageable  Sacral: stage 4 L shoulder: unstageable R heel unstageable  L heel - healed L medial foot: healed  Measurement: Left hip: 5 cm x 3.5 cm x 1.5 cm Sacral: 5 cm x 3 cm x 0.5 cm with undermining from 12 o'clock to 3 o'clock measuring 2.5 cm  L shoulder: 2 cm x 3 cm R heel:2 cm x 2 cm    Wound bed: Left hip:100% covered with thick yellow adherent slough, drainage is purulent with green/blue tint, no odor Sacrum: 90% clean red moist tissue in wound bed, 10% soft yellow slough at upper edge of wound, drainage is purulent, no odor L shoulder:90% wound bed covered with eschar with edges lifting. 10%  epithelialized pink tissue surrounding slough on all sides - no drainage or odor R heel: 100 % covered by dark brown eschar, wound feels boggy to touch, no drainage or odor  Periwound: Intact  Dressing procedure/placement/frequency: 1 - Left Hip PI unstageable Clean with NS, allow to air dry, apply Dakin's soaked gauze and then cover with foam dressing, change daily for 7 days.   2 - Left shoulder DTPI  Paint with Betadine and leave open to air.   3 - Sacral Unstageable PI Cleanse with Vashe#[id number]. Apply Aquacel Ag+ (silver hydrofiber) Lawson # [phone] in the wound bed and fill the undermining using a swab, cover with sacral foam dressing, change daily and PRN soilage   R heel:  Clean with Vashe ( lawson # [phone]), allow to air dry, apply santyl  to wound bed, cover with saline soaked gauze, and then cover with foam dressing, change daily   L heel:  Cover with foam dressing, change daily  Both feet should be placed in Prevalon boots to offload pressure Soila [phone])     PT wound care present at time of visit, daughter is not at bedside today, will outreach daughter by phone to discuss visit and changes in the plan of care. Noted patient is not utilizing Prevalon boots, with preference for a wedge to help elevate the  heels.  WOC team will continue to follow patient weekly, please re-consult if new needs arise.  Thank you,  Doyal Polite, RN, MSN, [facility] WOC Team

## 2024-05-21 NOTE — Plan of Care (Signed)
   Problem: Fluid Volume: Goal: Hemodynamic stability will improve Outcome: Progressing

## 2024-05-21 NOTE — Progress Notes (Signed)
 Physical Therapy Wound Treatment Patient Details  Name: Earl [name] MRN: [id number] Date of Birth: Aug 29, 1941  Today's Date: 05/21/2024 Time: 8883-8859 Time Calculation (min): 24 min  Subjective  Subjective Assessment Subjective: WOC RN in room for assessment prior to PT wound session Date of Onset:  (unknown) Prior Treatments: Dressing changes, off weighting  Pain Score:    Wound Assessment  Pressure Injury 03/06/24 Hip Anterior;Left;Proximal Unstageable - Full thickness tissue loss in which the base of the injury is covered by slough (yellow, tan, gray, green or brown) and/or eschar (tan, brown or black) in the wound bed. unstageable (Active)  Wound Image   05/21/24 1100  Dressing Type Foam - Lift dressing to assess site every shift 05/21/24 1100  Dressing Changed 05/21/24 1100  Dressing Change Frequency Daily 05/21/24 1100  State of Healing Non-healing 05/21/24 1100  Site / Wound Assessment Pink;Red;Yellow 05/21/24 1100  % Wound base Red or Granulating 10% 05/21/24 1100  % Wound base Yellow/Fibrinous Exudate 50% 05/21/24 1100  % Wound base Black/Eschar 0% 05/21/24 1100  % Wound base Other/Granulation Tissue (Comment) 40% 05/21/24 1100  Peri-wound Assessment Intact;Pink 05/21/24 1100  Wound Length (cm) 4.4 cm 05/21/24 1100  Wound Width (cm) 3 cm 05/21/24 1100  Wound Depth (cm) 1.9 cm 05/21/24 1100  Wound Surface Area (cm^2) 13.2 cm^2 05/21/24 1100  Wound Volume (cm^3) 25.08 cm^3 05/21/24 1100  Tunneling (cm) 0 05/17/24 1457  Undermining (cm) ~0.2cm 12:00-1:00 05/21/24 1100  Margins Unattached edges (unapproximated) 05/21/24 1100  Drainage Amount Minimal 05/21/24 1100  Drainage Description Green;Purulent 05/21/24 1100  Treatment Cleansed;Packing (Saline gauze) 05/21/24 1100        Selective Debridement (non-excisional) Selective Debridement (non-excisional) - Location: L hip Selective Debridement (non-excisional) - Tools Used: Forceps, Scalpel Selective Debridement  (non-excisional) - Tissue Removed: minimal yellow unviable tissue    Wound Assessment and Plan  Wound Therapy - Assess/Plan/Recommendations Wound Therapy - Clinical Statement: Wound bed mainly viable adipose, continue to remove nonviable tissue to aid in reducing bioburden. Drainage has slowed but continues to have green blue tint and WOC RN has reordered Dakin's for one more week (d/c 7/8) Wound Therapy - Functional Problem List: Decreased tolerance for position changes or OOB. Factors Delaying/Impairing Wound Healing: Diabetes Mellitus, Immobility, Infection - systemic/local, Multiple medical problems Hydrotherapy Plan: Debridement, Dressing change, Patient/family education Wound Therapy - Frequency: 2X / week Wound Therapy - Follow Up Recommendations: dressing changes by RN  Wound Therapy Goals- Improve the function of patient's integumentary system by progressing the wound(s) through the phases of wound healing (inflammation - proliferation - remodeling) by:    Goals will be updated until maximal potential achieved or discharge criteria met.  Discharge criteria: when goals achieved, discharge from hospital, MD decision/surgical intervention, no progress towards goals, refusal/missing three consecutive treatments without notification or medical reason.  GP     Charges PT Wound Care Charges $Wound Debridement up to 20 cm: < or equal to 20 cm $PT Hydrotherapy Dressing: 2 dressings     Almarie B. [name] PT, DPT Acute Rehabilitation Services Please use secure chat or  Call Office [phone]   [name] 05/21/2024, 1:16 PM

## 2024-05-22 DIAGNOSIS — I480 Paroxysmal atrial fibrillation: Secondary | ICD-10-CM | POA: Diagnosis not present

## 2024-05-22 DIAGNOSIS — A419 Sepsis, unspecified organism: Secondary | ICD-10-CM | POA: Diagnosis not present

## 2024-05-22 DIAGNOSIS — K219 Gastro-esophageal reflux disease without esophagitis: Secondary | ICD-10-CM | POA: Diagnosis not present

## 2024-05-22 DIAGNOSIS — L98429 Non-pressure chronic ulcer of back with unspecified severity: Secondary | ICD-10-CM | POA: Diagnosis not present

## 2024-05-22 LAB — COMPREHENSIVE METABOLIC PANEL WITH GFR
ALT: 71 U/L — ABNORMAL HIGH (ref 0–44)
AST: 61 U/L — ABNORMAL HIGH (ref 15–41)
Albumin: 2.9 g/dL — ABNORMAL LOW (ref 3.5–5.0)
Alkaline Phosphatase: 132 U/L — ABNORMAL HIGH (ref 38–126)
Anion gap: 11 (ref 5–15)
BUN: 37 mg/dL — ABNORMAL HIGH (ref 8–23)
CO2: 23 mmol/L (ref 22–32)
Calcium: 9.1 mg/dL (ref 8.9–10.3)
Chloride: 108 mmol/L (ref 98–111)
Creatinine, Ser: 0.98 mg/dL (ref 0.61–1.24)
GFR, Estimated: >60 mL/min (ref >60)
Glucose, Bld: 278 mg/dL — ABNORMAL HIGH (ref 70–99)
Potassium: 4.6 mmol/L (ref 3.5–5.1)
Sodium: 142 mmol/L (ref 135–145)
Total Bilirubin: 1.1 mg/dL (ref 0.0–1.2)
Total Protein: 6 g/dL — ABNORMAL LOW (ref 6.5–8.1)

## 2024-05-22 LAB — GLUCOSE, CAPILLARY
Glucose-Capillary: 194 mg/dL — ABNORMAL HIGH (ref 70–99)
Glucose-Capillary: 224 mg/dL — ABNORMAL HIGH (ref 70–99)
Glucose-Capillary: 156 mg/dL — ABNORMAL HIGH (ref 70–99)
Glucose-Capillary: 140 mg/dL — ABNORMAL HIGH (ref 70–99)

## 2024-05-22 NOTE — Progress Notes (Signed)
 Occupational Therapy Treatment Patient Details Name: Earl [name] MRN: [id number] DOB: Mar 31, 1941 Today's Date: 05/22/2024   History of present illness 83 y.o. male admitted from [facility] SNF rehab on 03/04/24 with SOB, sacral pain, Afib with RVR. Pt with acute metabolic encephalopathy, rhabdomyolysis and sepsis secondary to sacral wound infection. 4/15 & 4/17 bedside sacral wound debridement. PMhx: adm 3/26-4/08/2024 with fall, AMS, COVID; d/c to SNF rehab. CVA (2021, residual R-side weakness), T2DM, HTN, CHF, MGUS, Afib on Eliquis .   OT comments  Pt making slow progress towards pt focused goals. Pt motivated and tolerable to multiple STS using the stedy to which he was needing mod A+2 to boost into upright position but pt was able to get his bottom off the bed without and assistance. Unable to fully lock out his knees with his contractures but reinforced ROM with family and pt to continue efforts of progressing. Pt very ecstatic about his efforts today, OT to continue to progress pt as able. DC plans remain appropriate for skilled rehab < 3hrs of therapy.       If plan is discharge home, recommend the following:  A lot of help with walking and/or transfers;A lot of help with bathing/dressing/bathroom;Assistance with cooking/housework;Assist for transportation;Help with stairs or ramp for entrance;Two people to help with walking and/or transfers;Assistance with feeding   Equipment Recommendations  Other (comment) (defer)    Recommendations for Other Services      Precautions / Restrictions Precautions Precautions: Fall Recall of Precautions/Restrictions: Impaired Precaution/Restrictions Comments: sacral wound Restrictions Weight Bearing Restrictions Per Provider Order: No       Mobility Bed Mobility Overal bed mobility: Needs Assistance Bed Mobility: Rolling, Sidelying to Sit, Sit to Sidelying Rolling: Max assist, Used rails Sidelying to sit: Used rails, Mod assist      Sit to sidelying: +2 for physical assistance, Mod assist, Used rails General bed mobility comments: assist for guiding legs off EOB    Transfers Overall transfer level: Needs assistance Equipment used: Ambulation equipment used Transfers: Sit to/from Stand Sit to Stand: Mod assist, +2 safety/equipment, +2 physical assistance, From elevated surface           General transfer comment: STSx5 to stand with the stedy mod A +2 needed. Unable to fully extend bilat knees given his contractures but pt tolerating holds >30secs Transfer via Lift Equipment: Stedy   Balance Overall balance assessment: Needs assistance Sitting-balance support: Feet supported Sitting balance-Leahy Scale: Fair Sitting balance - Comments: sitting EOB with CGA by end of session. orginally needed single UE support or stedy to maintain balance. Postural control: Posterior lean Standing balance support: Bilateral upper extremity supported, Reliant on assistive device for balance Standing balance-Leahy Scale: Zero                             ADL either performed or assessed with clinical judgement   ADL       Grooming: Contact guard assist;Wash/dry face;Sitting Grooming Details (indicate cue type and reason): utilized stedy to get pt setup in front of the sink, completing grooming with CGA while in stedy perched position. cues to correct L lean                               General ADL Comments: Worked with pt on progressing OOB with stedy, pt daughter present during sessions. Discussed continued ROM of his BLEs with prolonged stretching as pt  lack of bilat knee ext limits his ability to acheive full standing posture.    Extremity/Trunk Assessment Upper Extremity Assessment Upper Extremity Assessment: Generalized weakness RUE Deficits / Details: impaired grip/grasp RUE Coordination: decreased fine motor;decreased gross motor LUE Deficits / Details: able to grasp washcloth to wash face  and hands LUE Coordination: decreased fine motor            Vision       Perception     Praxis     Communication Communication Communication: Impaired Factors Affecting Communication: Reduced clarity of speech;Difficulty expressing self   Cognition Arousal: Alert Behavior During Therapy: WFL for tasks assessed/performed Cognition: No apparent impairments                               Following commands: Impaired Following commands impaired: Follows one step commands with increased time      Cueing   Cueing Techniques: Verbal cues, Gestural cues, Tactile cues, Visual cues  Exercises Other Exercises Other Exercises: Overhead reaching BUEs, standingin stedy. Other Exercises: bilat knee ext stretch x5 with 10-15 sec holds    Shoulder Instructions       General Comments daughter present and supportive. pt needing cues throughout session for controlled pursed lip breahting. Seems anxious and OOB when communicating.    Pertinent Vitals/ Pain       Pain Assessment Pain Assessment: Faces Faces Pain Scale: Hurts little more Pain Location: Bottom Pain Descriptors / Indicators: Sore, Discomfort Pain Intervention(s): Monitored during session, Repositioned  Home Living                                          Prior Functioning/Environment              Frequency  Min 1X/week        Progress Toward Goals  OT Goals(current goals can now be found in the care plan section)  Progress towards OT goals: Progressing toward goals (slow progress)  Acute Rehab OT Goals Patient Stated Goal: Keep coming back. OT Goal Formulation: With patient Time For Goal Achievement: 06/05/24 Potential to Achieve Goals: Fair ADL Goals Pt Will Perform Grooming: sitting;with supervision Pt Will Perform Upper Body Dressing: sitting;with min assist Additional ADL Goal #2: Pt daughter will demonstrate independence with PROM HEP for BLE ROM  Plan       Co-evaluation                 AM-PAC OT 6 Clicks Daily Activity     Outcome Measure   Help from another person eating meals?: A Little Help from another person taking care of personal grooming?: A Little Help from another person toileting, which includes using toliet, bedpan, or urinal?: Total Help from another person bathing (including washing, rinsing, drying)?: Total Help from another person to put on and taking off regular upper body clothing?: A Lot Help from another person to put on and taking off regular lower body clothing?: Total 6 Click Score: 11    End of Session Equipment Utilized During Treatment: Gait belt;Other (comment) Laurent)  OT Visit Diagnosis: Other abnormalities of gait and mobility (R26.89);Muscle weakness (generalized) (M62.81);History of falling (Z91.81);Feeding difficulties (R63.3);Ataxia, unspecified (R27.0) Pain - Right/Left: Left Pain - part of body: Shoulder   Activity Tolerance Patient tolerated treatment well   Patient Left in bed;with call bell/phone  within reach;with family/visitor present   Nurse Communication Mobility status        Time: 8476-8395 OT Time Calculation (min): 41 min  Charges: OT General Charges $OT Visit: 1 Visit OT Treatments $Self Care/Home Management : 8-22 mins $Therapeutic Activity: 8-22 mins $Therapeutic Exercise: 8-22 mins  05/22/2024  AB, OTR/L  Acute Rehabilitation Services  Office: [phone]   Earl [name] 05/22/2024, 5:52 PM

## 2024-05-22 NOTE — Progress Notes (Signed)
 Progress Note   Patient: Earl [name] FMW:[id number] DOB: [birth year] DOA: 03/04/2024     79 DOS: the patient was seen and examined on 05/22/2024   Brief hospital course: 83 year old male with history of PAF on Eliquis , IDDM 2, HTN, CVA with right-sided residual weakness, MGUS, BPH, systolic CHF who is here with sacral pain. Of note, he was hospitalized 3/26-4/10 with generalized weakness/acute encephalopathy, hypotension, and was COVID PCR positive. He was eventually discharged to SNF and readmitted 4 days later with pain in the sacral area and shortness of breath. He was found to be in A-fib with RVR, tachypneic and was hypotensive on admission for which critical care was consulted. General surgery was consulted for sacral wound debridement status post bedside excision 4/15 and 03/07/2024.   Assessment and Plan: Sacral decubitus ulcer, stage IV Infected ulcer Present on admission. Concern for infection on admission. Patient started empirically on vancomycin , cefepime , and Flagyl . CT on 6/16 concern for possible osteomyelitis. ID consulted with recommendation to discontinue antibiotics.  Surgery consulted and performed wound debridement during this hospitalization.  Plastic surgery was also consulted with recommendation for continued wound care and no surgical management. Continue current wound care Continue pressure relieving therapies. Advised to work with PT/OT. Daughter asked about psychiatry consult as he is not acting self, talking less with flat affect. Psychiatry consult called.   Severe sepsis Present on admission presumed secondary to sacral ulcer infection.  Patient managed with antibiotics.  Sepsis physiology resolved.   Acute on chronic pain Discontinue scheduled oxycodone  at night. Continue oxycodone  as needed Continue Tylenol  as needed. LFT up noted.   Paroxysmal atrial fibrillation Continue amiodarone , Eliquis    Acute on chronic combined heart failure Anasarca He does  have abdominal and thigh swelling. Resumed Lasix  IV as a daily dose; increase if poor urine output Continue Tafamidis  and vutrisiran    Left pleural effusion Patient underwent left-sided thoracentesis on 6/18 yielded 600 mL of hazy amber fluid.  Body fluid culture with no growth.   Anemia of chronic disease Stable. Hb 11.   Elevated LFTs No liver pathology seen on CT imaging. Stable.   Hypernatremia Improved. Likely related to Lasix  diuresis and developing dehydration.    Dysphagia Speech therapy recommendations (5/29):  Diet recommendations: Thin liquid;Dysphagia 1 (puree) SLP for cognition/ speech pending.   Severe protein malnutrition Continue dietician recommendations, encourage diet, supplements. Dietician for diet recommendations as he is eating less.   Diabetes mellitus type 2 Poorly controlled with hyperglycemia based on hemoglobin A1C of 8.4%.  Continue Semglee  and SSI. Daughter asked about psychiatry eval for depression.   Pressure injury Left shoulder, left hip, left heel, right heel; all not present on admission. Right toe, unclear if present on admission.     Out of bed to chair. Incentive spirometry. Nursing supportive care. Fall, aspiration precautions. Diet:  Diet Orders (From admission, onward)     Start     Ordered   04/30/24 1436  DIET - DYS 1 Room service appropriate? No; Fluid consistency: Thin  Diet effective now       Question Answer Comment  Room service appropriate? No   Fluid consistency: Thin      04/30/24 1436           DVT prophylaxis: SCDs Start: 03/04/24 2151 Place TED hose Start: 03/04/24 2151 apixaban  (ELIQUIS ) tablet 5 mg  Level of care: Telemetry Medical   Code Status: Limited: Do not attempt resuscitation (DNR) -DNR-LIMITED -Do Not Intubate/DNI   Subjective: Patient is seen and examined  today morning. He is lying in bed, talks less. Daughter at bedside feeding him. RN noted that his sacral wound has purulent  drainage.  Physical Exam: Vitals:   05/22/24 0442 05/22/24 0500 05/22/24 0904 05/22/24 1235  BP: 120/72  131/75 132/79  Pulse: 80  70 80  Resp: 19  17 (!) 21  Temp: (!) 97.5 F (36.4 C)  (!) 97.4 F (36.3 C) (!) 97.1 F (36.2 C)  TempSrc: Oral  Oral Axillary  SpO2: 96%  100% 100%  Weight:  79.8 kg    Height:        General - Elderly African American ill male, mild respiratory distress while talking. HEENT - PERRLA, EOMI, atraumatic head, non tender sinuses. Lung - Clear, basal rales, rhonchi, wheezes. Heart - S1, S2 heard, no murmurs, rubs, pitting edema thigh, abdomen better. Abdomen - Soft, non tender, distended, bowel sounds good Neuro - Alert, awake and oriented, non focal exam. Skin - Warm and dry.  Data Reviewed:      Latest Ref Rng & Units 05/19/2024    5:44 AM 05/18/2024    4:42 AM 05/15/2024    2:02 PM  CBC  WBC 4.0 - 10.5 K/uL 5.1  5.4  5.7   Hemoglobin 13.0 - 17.0 g/dL 88.4  89.1  88.6   Hematocrit 39.0 - 52.0 % 35.8  33.1  34.5   Platelets 150 - 400 K/uL 249  236  241       Latest Ref Rng & Units 05/22/2024    4:23 AM 05/19/2024    5:44 AM 05/18/2024    4:42 AM  BMP  Glucose 70 - 99 mg/dL 721  879  98   BUN 8 - 23 mg/dL 37  28  35   Creatinine 0.61 - 1.24 mg/dL 9.01  9.04  9.06   Sodium 135 - 145 mmol/L 142  142  147   Potassium 3.5 - 5.1 mmol/L 4.6  3.2  3.3   Chloride 98 - 111 mmol/L 108  106  110   CO2 22 - 32 mmol/L 23  27  25    Calcium  8.9 - 10.3 mg/dL 9.1  8.4  8.6    No results found.  Family Communication: Discussed with patient, daughter at bedside. They understand and agree. All questions answered.  Disposition: Status is: Inpatient Remains inpatient appropriate because: placement, wound care.  Planned Discharge Destination: Skilled nursing facility     Time spent: 43 minutes  Author: Concepcion Riser, MD 05/22/2024 3:40 PM Secure chat 7am to 7pm For on call review [email or web address].

## 2024-05-22 NOTE — Plan of Care (Signed)
  Problem: Fluid Volume: Goal: Hemodynamic stability will improve Outcome: Progressing   Problem: Clinical Measurements: Goal: Diagnostic test results will improve Outcome: Progressing Goal: Signs and symptoms of infection will decrease Outcome: Progressing   Problem: Respiratory: Goal: Ability to maintain adequate ventilation will improve Outcome: Progressing   Problem: Education: Goal: Knowledge of risk factors and measures for prevention of condition will improve Outcome: Progressing   Problem: Coping: Goal: Psychosocial and spiritual needs will be supported Outcome: Progressing   Problem: Respiratory: Goal: Will maintain a patent airway Outcome: Progressing Goal: Complications related to the disease process, condition or treatment will be avoided or minimized Outcome: Progressing   Problem: Education: Goal: Ability to describe self-care measures that may prevent or decrease complications (Diabetes Survival Skills Education) will improve Outcome: Progressing   Problem: Coping: Goal: Ability to adjust to condition or change in health will improve Outcome: Progressing   Problem: Fluid Volume: Goal: Ability to maintain a balanced intake and output will improve Outcome: Progressing   Problem: Health Behavior/Discharge Planning: Goal: Ability to manage health-related needs will improve Outcome: Progressing   Problem: Metabolic: Goal: Ability to maintain appropriate glucose levels will improve Outcome: Progressing   Problem: Nutritional: Goal: Maintenance of adequate nutrition will improve Outcome: Progressing Goal: Progress toward achieving an optimal weight will improve Outcome: Progressing   Problem: Skin Integrity: Goal: Risk for impaired skin integrity will decrease Outcome: Progressing   Problem: Tissue Perfusion: Goal: Adequacy of tissue perfusion will improve Outcome: Progressing   Problem: Education: Goal: Knowledge of General Education information  will improve Description: Including pain rating scale, medication(s)/side effects and non-pharmacologic comfort measures Outcome: Progressing   Problem: Health Behavior/Discharge Planning: Goal: Ability to manage health-related needs will improve Outcome: Progressing   Problem: Clinical Measurements: Goal: Ability to maintain clinical measurements within normal limits will improve Outcome: Progressing Goal: Will remain free from infection Outcome: Progressing Goal: Diagnostic test results will improve Outcome: Progressing Goal: Respiratory complications will improve Outcome: Progressing Goal: Cardiovascular complication will be avoided Outcome: Progressing   Problem: Activity: Goal: Risk for activity intolerance will decrease Outcome: Progressing   Problem: Nutrition: Goal: Adequate nutrition will be maintained Outcome: Progressing   Problem: Coping: Goal: Level of anxiety will decrease Outcome: Progressing   Problem: Elimination: Goal: Will not experience complications related to bowel motility Outcome: Progressing Goal: Will not experience complications related to urinary retention Outcome: Progressing   Problem: Pain Managment: Goal: General experience of comfort will improve and/or be controlled Outcome: Progressing   Problem: Safety: Goal: Ability to remain free from injury will improve Outcome: Progressing   Problem: Skin Integrity: Goal: Risk for impaired skin integrity will decrease Outcome: Progressing

## 2024-05-23 DIAGNOSIS — K219 Gastro-esophageal reflux disease without esophagitis: Secondary | ICD-10-CM | POA: Diagnosis not present

## 2024-05-23 DIAGNOSIS — A419 Sepsis, unspecified organism: Secondary | ICD-10-CM | POA: Diagnosis not present

## 2024-05-23 DIAGNOSIS — I480 Paroxysmal atrial fibrillation: Secondary | ICD-10-CM | POA: Diagnosis not present

## 2024-05-23 DIAGNOSIS — L98429 Non-pressure chronic ulcer of back with unspecified severity: Secondary | ICD-10-CM | POA: Diagnosis not present

## 2024-05-23 LAB — COMPREHENSIVE METABOLIC PANEL WITH GFR
ALT: 62 U/L — ABNORMAL HIGH (ref 0–44)
AST: 49 U/L — ABNORMAL HIGH (ref 15–41)
Albumin: 3 g/dL — ABNORMAL LOW (ref 3.5–5.0)
Alkaline Phosphatase: 133 U/L — ABNORMAL HIGH (ref 38–126)
Anion gap: 10 (ref 5–15)
BUN: 33 mg/dL — ABNORMAL HIGH (ref 8–23)
CO2: 23 mmol/L (ref 22–32)
Calcium: 9.1 mg/dL (ref 8.9–10.3)
Chloride: 107 mmol/L (ref 98–111)
Creatinine, Ser: 1.02 mg/dL (ref 0.61–1.24)
GFR, Estimated: >60 mL/min (ref >60)
Glucose, Bld: 87 mg/dL (ref 70–99)
Potassium: 4.6 mmol/L (ref 3.5–5.1)
Sodium: 140 mmol/L (ref 135–145)
Total Bilirubin: 1.3 mg/dL — ABNORMAL HIGH (ref 0.0–1.2)
Total Protein: 5.9 g/dL — ABNORMAL LOW (ref 6.5–8.1)

## 2024-05-23 LAB — GLUCOSE, CAPILLARY
Glucose-Capillary: 94 mg/dL (ref 70–99)
Glucose-Capillary: 153 mg/dL — ABNORMAL HIGH (ref 70–99)
Glucose-Capillary: 154 mg/dL — ABNORMAL HIGH (ref 70–99)
Glucose-Capillary: 110 mg/dL — ABNORMAL HIGH (ref 70–99)

## 2024-05-23 MED ORDER — SERTRALINE HCL 25 MG PO TABS
50.0000 mg | ORAL_TABLET | Freq: Every day | ORAL | Status: DC
  Administered 2024-05-24 – 2024-08-21 (×93): 50 mg via ORAL
  Filled 2024-05-23 (×92): qty 2

## 2024-05-23 MED ORDER — MELATONIN 3 MG PO TABS
3.0000 mg | ORAL_TABLET | Freq: Once | ORAL | Status: AC | PRN
  Administered 2024-05-30: 3 mg via ORAL
  Filled 2024-05-23: qty 1

## 2024-05-23 NOTE — Plan of Care (Signed)
 Assumed care at 1900. Pt has been resting comfortably in bed overnight and repositioned. No complaints of pain at this time, see MAR. No significant events at this time. Wound care completed. Patient was yellow MEWs, MD and Charge RN notified.    Problem: Fluid Volume: Goal: Hemodynamic stability will improve Outcome: Not Progressing   Problem: Respiratory: Goal: Ability to maintain adequate ventilation will improve Outcome: Not Progressing   Problem: Education: Goal: Knowledge of risk factors and measures for prevention of condition will improve Outcome: Not Progressing   Problem: Education: Goal: Ability to describe self-care measures that may prevent or decrease complications (Diabetes Survival Skills Education) will improve Outcome: Not Progressing

## 2024-05-23 NOTE — Plan of Care (Signed)
  Problem: Fluid Volume: Goal: Hemodynamic stability will improve Outcome: Progressing   Problem: Clinical Measurements: Goal: Diagnostic test results will improve Outcome: Progressing Goal: Signs and symptoms of infection will decrease Outcome: Progressing   Problem: Respiratory: Goal: Ability to maintain adequate ventilation will improve Outcome: Progressing   Problem: Education: Goal: Knowledge of risk factors and measures for prevention of condition will improve Outcome: Progressing   Problem: Coping: Goal: Psychosocial and spiritual needs will be supported Outcome: Progressing   Problem: Respiratory: Goal: Will maintain a patent airway Outcome: Progressing Goal: Complications related to the disease process, condition or treatment will be avoided or minimized Outcome: Progressing   Problem: Education: Goal: Ability to describe self-care measures that may prevent or decrease complications (Diabetes Survival Skills Education) will improve Outcome: Progressing   Problem: Coping: Goal: Ability to adjust to condition or change in health will improve Outcome: Progressing   Problem: Fluid Volume: Goal: Ability to maintain a balanced intake and output will improve Outcome: Progressing   Problem: Health Behavior/Discharge Planning: Goal: Ability to manage health-related needs will improve Outcome: Progressing   Problem: Metabolic: Goal: Ability to maintain appropriate glucose levels will improve Outcome: Progressing   Problem: Nutritional: Goal: Maintenance of adequate nutrition will improve Outcome: Progressing Goal: Progress toward achieving an optimal weight will improve Outcome: Progressing   Problem: Skin Integrity: Goal: Risk for impaired skin integrity will decrease Outcome: Progressing   Problem: Tissue Perfusion: Goal: Adequacy of tissue perfusion will improve Outcome: Progressing   Problem: Education: Goal: Knowledge of General Education information  will improve Description: Including pain rating scale, medication(s)/side effects and non-pharmacologic comfort measures Outcome: Progressing   Problem: Health Behavior/Discharge Planning: Goal: Ability to manage health-related needs will improve Outcome: Progressing   Problem: Clinical Measurements: Goal: Ability to maintain clinical measurements within normal limits will improve Outcome: Progressing Goal: Will remain free from infection Outcome: Progressing Goal: Diagnostic test results will improve Outcome: Progressing Goal: Respiratory complications will improve Outcome: Progressing Goal: Cardiovascular complication will be avoided Outcome: Progressing   Problem: Activity: Goal: Risk for activity intolerance will decrease Outcome: Progressing   Problem: Nutrition: Goal: Adequate nutrition will be maintained Outcome: Progressing   Problem: Coping: Goal: Level of anxiety will decrease Outcome: Progressing   Problem: Elimination: Goal: Will not experience complications related to bowel motility Outcome: Progressing Goal: Will not experience complications related to urinary retention Outcome: Progressing   Problem: Pain Managment: Goal: General experience of comfort will improve and/or be controlled Outcome: Progressing   Problem: Safety: Goal: Ability to remain free from injury will improve Outcome: Progressing   Problem: Skin Integrity: Goal: Risk for impaired skin integrity will decrease Outcome: Progressing

## 2024-05-23 NOTE — Progress Notes (Signed)
 Speech Language Pathology Treatment: Cognitive-Linguistic  Patient Details Name: Earl [name] MRN: [id number] DOB: July 30, 1941 Today's Date: 05/23/2024 Time: 0810-0823 SLP Time Calculation (min) (ACUTE ONLY): 13 min  Assessment / Plan / Recommendation Clinical Impression  Pt seen for SLP session to address respiratory strengthening exercises to aid vocal intensity. Unclear if pt is completing exercises independently throughout the week, though no subjective clinical improvements are observed and he continues to complete IS exercise at a range of 228mL-600mL.  He completed x3 sets of 5 IS exercises with min cues and completed x2 sets of 10 EMST exercises with set up. Pt continues to exhibit increased work of breathing and rest and even more so with activity. Anticipate poor prognosis for therapeutic intervention at this level of care. SLP will follow up x1 more to attempt family education (daughter not at bedside today) and leave written instructions for independent exercise plan. Further voice assessment should be considered by outpatient SLP at an ENT office where more objective measures are available.   Plan: SLP to follow up x1 for independent exercise program + family education.    HPI HPI: Patient is an 83 y.o. 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 He was discharged from the hospital (admitted for acute metabolic encephalopathy, hypotensive, hypoglycemic) to [facility] for rehab. He presented back to the hospital four days later on 03/04/24 with SOB and pain in sacral area. 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 SLP previous saw pt to assess and manage concerns for dysphagia. SLP re-consulted on 05/02/24 for voice assessment.      SLP Plan   Continue with current plan of care          Recommendations                  Oral care BID   Frequent or constant Supervision/Assistance Aphonia (R49.1);Dysarthria and anarthria (R47.1)     Continue with current plan of care     [name]  05/23/2024, 11:40 AM

## 2024-05-23 NOTE — TOC Progression Note (Signed)
 Transition of Care [facility]) - Progression Note    Patient Details  Name: Earl [name] MRN: [id number] Date of Birth: 10/15/1941  [facility] CM/SW Contact  Rosaline JONELLE Joe, RN Phone Number: 05/23/2024, 12:42 PM  Clinical Narrative:    CM spoke with the patient's son by phone after he visited the patient at the bedside.  The patient's son expressed concern about patient physical decline.  Son states that patient is more short of breath and has not been eating and drinking well.  CM will continue to follow the patient for TOC needs.  Patient is being followed by Palliative Care Team.     Expected Discharge Plan: Skilled Nursing Facility Barriers to Discharge: Continued Medical Work up  Expected Discharge Plan and Services In-house Referral: Clinical Social Work     Living arrangements for the past 2 months:  (from [facility] short term rehab)                                       Social Determinants of Health (SDOH) Interventions SDOH Screenings   Food Insecurity: No Food Insecurity (03/05/2024)  Housing: Low Risk  (03/05/2024)  Transportation Needs: No Transportation Needs (03/05/2024)  Utilities: Not At Risk (03/05/2024)  Depression (PHQ2-9): Low Risk  (10/03/2023)  Financial Resource Strain: Patient Declined (01/16/2024)  Physical Activity: Unknown (01/16/2024)  Social Connections: Moderately Integrated (03/05/2024)  Stress: Stress Concern Present (01/16/2024)  Tobacco Use: Low Risk  (05/12/2024)    Readmission Risk Interventions    03/19/2024   10:24 AM  Readmission Risk Prevention Plan  Transportation Screening Complete  Medication Review (RN Care Manager) Complete  PCP or Specialist appointment within 3-5 days of discharge Complete  HRI or Home Care Consult Complete  SW Recovery Care/Counseling Consult Complete  Palliative Care Screening Complete  Skilled Nursing Facility Complete

## 2024-05-23 NOTE — Progress Notes (Signed)
 Physical Therapy Treatment Patient Details Name: Earl [name] MRN: [id number] DOB: 11/04/1941 Today's Date: 05/23/2024   History of Present Illness 83 y.o. male admitted from [facility] rehab on 03/04/24 with SOB, sacral pain, Afib with RVR. Pt with acute metabolic encephalopathy, rhabdomyolysis and sepsis secondary to sacral wound infection. 4/15 & 4/17 bedside sacral wound debridement. PMhx: adm 3/26-4/08/2024 with fall, AMS, COVID; d/c to SNF rehab. CVA (2021, residual R-side weakness), T2DM, HTN, CHF, MGUS, Afib on Eliquis .    PT Comments  The pt declined OOB attempts this date, crying please don't make me stand today due to increased pain, fatigue, and SOB this date. RN aware of increased SOB this date. VSS on RA during session. Thus, focused session rather on upper and lower extremity strengthening. He required rest breaks frequently between sets and sometimes between reps. He demonstrates continued deficits in general strength, power, and endurance. Will continue to follow acutely.    If plan is discharge home, recommend the following: Two people to help with walking and/or transfers;Two people to help with bathing/dressing/bathroom;Assistance with feeding;Assist for transportation   Can travel by private vehicle     No  Equipment Recommendations  Hospital bed;Hoyer lift;Wheelchair cushion (measurements PT);Wheelchair (measurements PT) (air mattress and roho cushion)    Recommendations for Other Services       Precautions / Restrictions Precautions Precautions: Fall Recall of Precautions/Restrictions: Impaired Precaution/Restrictions Comments: sacral wound Restrictions Weight Bearing Restrictions Per Provider Order: No     Mobility  Bed Mobility Overal bed mobility: Needs Assistance Bed Mobility: Rolling Rolling: Total assist         General bed mobility comments: Pt attempting to reach over to rail to pull but ultimately did not give 25% of effort and  required total assist to roll to R 3x to check for BM then for pressure relief repositioning. Pt declined OOB mobility this date    Transfers                   General transfer comment: Pt crying please don't make me stand today, declining OOB mobility this date    Ambulation/Gait               General Gait Details: unable at this time, declined OOB attempts this date   Stairs             Wheelchair Mobility     Tilt Bed    Modified Rankin (Stroke Patients Only)       Balance Overall balance assessment: Needs assistance                                          Communication Communication Communication: Impaired Factors Affecting Communication: Reduced clarity of speech;Difficulty expressing self;Other (comment) (soft spoken)  Cognition Arousal: Alert Behavior During Therapy: WFL for tasks assessed/performed   PT - Cognitive impairments: Problem solving, Safety/Judgement, Difficult to assess Difficult to assess due to: Impaired communication (pt very soft spoken and SOB today)                     PT - Cognition Comments: pt limited by being SOB and thereby soft spoken today, so difficult to fully assess Following commands: Impaired Following commands impaired: Follows one step commands with increased time    Cueing Cueing Techniques: Verbal cues, Tactile cues  Exercises General Exercises - Upper Extremity Shoulder Flexion:  AAROM, Both, 5 reps, Supine, Theraband Theraband Level (Shoulder Flexion): Level 1 (Yellow) Elbow Extension: AROM, Both, 5 reps, Supine, Theraband Theraband Level (Elbow Extension): Level 1 (Yellow) General Exercises - Lower Extremity Ankle Circles/Pumps: AROM, Both, 10 reps, Supine Short Arc Quad: AAROM, Strengthening, Both, 5 reps, Supine Heel Slides: AAROM, Strengthening, Both, 5 reps, Supine Hip ABduction/ADduction: AAROM, Strengthening, Both, 5 reps, Supine Straight Leg Raises: AAROM,  Strengthening, Both, 5 reps, Supine Other Exercises Other Exercises: Bil HHA pull ups to lift upper back off bed while supine in bed with HOB elevated, x5 reps Other Exercises: Bil bench press supine, pushing up against yellow therabands, x5 reps Other Exercises: Bil shoulder internal rotation against yellow therabands, 5x, supine    General Comments General comments (skin integrity, edema, etc.): educated pt on importance of mobilizing frequently to prevent further muscle atrophy and deconditioning, he verbalized understanding; VSS on RA      Pertinent Vitals/Pain Pain Assessment Pain Assessment: Faces Faces Pain Scale: Hurts even more Pain Location: bil legs, generalized Pain Descriptors / Indicators: Tender, Sore, Discomfort, Grimacing, Guarding Pain Intervention(s): Limited activity within patient's tolerance, Monitored during session, Repositioned    Home Living                          Prior Function            PT Goals (current goals can now be found in the care plan section) Acute Rehab PT Goals Patient Stated Goal: walk PT Goal Formulation: With patient Time For Goal Achievement: 05/27/24 Potential to Achieve Goals: Fair Progress towards PT goals: Progressing toward goals (slowly)    Frequency    Min 1X/week      PT Plan      Co-evaluation              AM-PAC PT 6 Clicks Mobility   Outcome Measure  Help needed turning from your back to your side while in a flat bed without using bedrails?: Total Help needed moving from lying on your back to sitting on the side of a flat bed without using bedrails?: Total Help needed moving to and from a bed to a chair (including a wheelchair)?: Total Help needed standing up from a chair using your arms (e.g., wheelchair or bedside chair)?: Total Help needed to walk in hospital room?: Total Help needed climbing 3-5 steps with a railing? : Total 6 Click Score: 6    End of Session   Activity  Tolerance: Patient limited by pain;Patient limited by fatigue Patient left: in bed;with call bell/phone within reach;Other (comment) (rolled to R side for pressure relief) Nurse Communication: Mobility status;Other (comment) (SOB) PT Visit Diagnosis: Muscle weakness (generalized) (M62.81);Adult, failure to thrive (R62.7);Pain;Other abnormalities of gait and mobility (R26.89);Unsteadiness on feet (R26.81);Difficulty in walking, not elsewhere classified (R26.2) Pain - Right/Left:  (bil) Pain - part of body: Knee (knees, bottom, lower back)     Time: 8570-8544 PT Time Calculation (min) (ACUTE ONLY): 26 min  Charges:    $Therapeutic Exercise: 23-37 mins PT General Charges $$ ACUTE PT VISIT: 1 Visit                     Theo Ferretti, PT, DPT Acute Rehabilitation Services  Office: [phone]    Theo CHRISTELLA Ferretti 05/23/2024, 6:17 PM

## 2024-05-23 NOTE — Consult Note (Signed)
 [facility] Psychiatric Consult Initial  Patient Name: .Earl [name]  MRN: [id number]  DOB: 07-21-1941  Consult Order details:  Orders (From admission, onward)     Start     Ordered   05/22/24 1606  IP CONSULT TO PSYCHIATRY       Ordering Provider: Darci Pore, MD  Provider:  (Not yet assigned)  Question Answer Comment  Location [facility]   Reason for Consult? depression      05/22/24 1605             Mode of Visit: In person    Psychiatry Consult Evaluation  Service Date: May 23, 2024 LOS:  LOS: 80 days  Chief Complaint: depression  Primary Psychiatric Diagnoses  Adjustment disorder with anxious features  Assessment  Earl [name] is a 83 y.o. male admitted Medically at 03/04/2024  4:41 PM for management of A-fib with RVR and severe sepsis secondary to decubitus sacral ulcers stage IV. He has no prior psychiatric history and has a past medical history of stroke, insulin -dependent T2DM, sacral pressure ulcer, failure to thrive.   His current presentation of decreased appetite, increased restlessness, and increased anxiety in the setting of recent medical illness and prolonged hospitalization is most consistent with adjustment disorder with anxious features. He does not have a history of taking psychotropic medications prior to hospitalization.   He has been started on Zoloft , which we will increase for the above symptoms.  We will check in with the patient on 7/7 to ensure tolerability and likely sign off at that point.  The patient's daughter was contacted, and we had a long discussion about the patient's current symptoms, medical difficulties, and our recommendation for the medication change.  All questions were answered.  She is his primary social support and she was given information for outpatient psychiatry follow-up.  Diagnoses:  Active Hospital problems: Principal Problem:   Severe sepsis (HCC) Active Problems:   Paroxysmal  atrial fibrillation (HCC)   MGUS (monoclonal gammopathy of unknown significance)   Essential hypertension   Hyperlipidemia   Insulin  dependent type 2 diabetes mellitus (HCC)   History of CVA (cerebrovascular accident)   Cardiac amyloidosis (HCC)   Chronic systolic CHF (congestive heart failure) (HCC)   Sepsis (HCC)   Atrial flutter (HCC)   GERD (gastroesophageal reflux disease)   Sacral ulcer (HCC)   History of hypertension   Atrial fibrillation with RVR (HCC)   Community acquired pneumonia   Protein-calorie malnutrition, severe   Wild-type transthyretin-related (ATTR) amyloidosis (HCC)    Plan   ## Psychiatric Medication Recommendations:  -Adjustment disorder with anxious features  - Increase sertraline  from 25mg  to 50mg  daily for anxiety and depression  - Continue hydroxyzine  10mg  TID PRN   ## Medical Decision Making Capacity: Not specifically addressed in this encounter  ## Further Work-up:  -- Per primary team -- most recent EKG on 6/17 had QtC of 401 -- Pertinent labwork reviewed earlier this admission includes: CMP, CBC, Mag, glucose, U/A   ## Disposition:-- There are no psychiatric contraindications to discharge at this time. Daughter provided with phone number for [facility].  ## Behavioral / Environmental: - No specific recommendations at this time.     ## Safety and Observation Level:  - Based on my clinical evaluation, I estimate the patient to be at low risk of self harm in the current setting. - At this time, we recommend  routine. This decision is based on my review of the chart including  patient's history and current presentation, interview of the patient, mental status examination, and consideration of suicide risk including evaluating suicidal ideation, plan, intent, suicidal or self-harm behaviors, risk factors, and protective factors. This judgment is based on our ability to directly address suicide risk, implement suicide  prevention strategies, and develop a safety plan while the patient is in the clinical setting. Please contact our team if there is a concern that risk level has changed.  CSSR Risk Category:C-SSRS RISK CATEGORY: No Risk  Suicide Risk Assessment: Patient has following modifiable risk factors for suicide: pain, medical illness (ie new dx of cancer), which we are addressing by psychotropic medications. Patient has following non-modifiable or demographic risk factors for suicide: male gender Patient has the following protective factors against suicide: Access to outpatient mental health care, Supportive family, and no history of suicide attempts  Thank you for this consult request. Recommendations have been communicated to the primary team.  We will follow at this time.   Karleen Kaufmann, MD PGY-4        History of Present Illness  Relevant Aspects of [facility] Course:  Admitted on 03/04/2024 for A-fib with RVR and severe sepsis secondary to decubitus sacral ulcers stage IV.  Patient Report:  On initial examination, patient is alert and oriented to person, place, time. Throughout interview, speech is garbled though generally understandable.  The patient denies experiencing depression or anxiety.  Pt denies SI, sleep dysregulation.  His daughter may be realizing more symptoms of depression and anxiety.  Per daughter, patient has been eating less since being hospitalized, appears more anxious than baseline. She notes increased WOB that she believes may be related to anxiety. Please see plan above for detailed recommendations.   Psych ROS:  Depression: Yes, as described below, though the patient denies depressed mood Anxiety:  Yes Mania (lifetime and current): No Psychosis: (lifetime and current): No  Collateral information:  Contacted [employer] at [phone] on 7/3:  In addition to the information described above, we also discussed acute safety concerns with the patient's  daughter.  She reports the patient has made no statements about wanting to end his life or not being alive.  It does not appear the patient has previously had self-harm behavior or suicide attempts.  She denies having any present safety concerns regarding the patient.   Psychiatric and Social History  Psychiatric History:  Information collected from chart review Prev Dx/Sx: No pertinent history Current Psych Provider: None Home Meds (current): None Previous Med Trials: None Therapy: None  Prior Psych Hospitalization: No  Prior Self Harm: No Prior Violence: No  Family Psych History: None pertinent  Social History:  Was living at [facility] rehab before hospitalization.  Strong social support from his daughter.  Substance History None, has been hospitalized for about 2 months.  Exam Findings  Vital Signs:  Temp:  [97.1 F (36.2 C)-98.6 F (37 C)] 98 F (36.7 C) (07/03 0743) Pulse Rate:  [65-80] 70 (07/03 0743) Resp:  [19-39] 24 (07/03 0743) BP: (108-132)/(61-86) 120/65 (07/03 0743) SpO2:  [96 %-100 %] 96 % (07/03 0743) Weight:  [77.1 kg] 77.1 kg (07/03 0500) Blood pressure 120/65, pulse 70, temperature 98 F (36.7 C), temperature source Oral, resp. rate (!) 24, height 5' 11 (1.803 m), weight 77.1 kg, SpO2 96%. Body mass index is 23.71 kg/m.    Psychiatric Specialty Exam: Physical Exam Constitutional:      Appearance: the patient is not toxic-appearing.  Pulmonary:     Effort: Pulmonary effort is  normal.  Neurological:     General: No focal deficit present.     Mental Status: the patient is alert and oriented to person, place, and time.   Review of Systems  Respiratory: Shortness of breath Cardiovascular:  Negative for chest pain.  Gastrointestinal:  Negative for abdominal pain, constipation, diarrhea, nausea and vomiting.  Neurological:  Negative for headaches.      BP 131/78 (BP Location: Left Arm)   Pulse 90   Temp 98.2 F (36.8 C)   Resp (!) 21   Ht 5' 11  (1.803 m)   Wt 77.1 kg   SpO2 96%   BMI 23.71 kg/m   General Appearance: Fairly Groomed, breathing appears labored  Eye Contact:  Good  Speech: Overall coherent, difficult to understand at times  Volume:  Normal  Mood:  Euthymic  Affect:  Congruent, though difficult to assess based on medical illness  Thought Process:  Coherent  Orientation:  Full (Time, Place, and Person)  Thought Content: Logical   Suicidal Thoughts:  No  Homicidal Thoughts:  No  Memory:  Immediate;   Good  Judgement:  fair  Insight: Poor, does not feel he has any depression or anxiety  Psychomotor Activity:  Normal  Concentration:  Concentration: Good  Recall:  Good  Fund of Knowledge: Good  Language: Good  Akathisia:  No  Handed:  not assessed  AIMS (if indicated): not done  Assets:  Communication Skills Desire for Improvement Financial Resources/Insurance Housing Leisure Time Physical Health  ADL's:  Intact  Cognition: WNL       Other History   These have been pulled in through the EMR, reviewed, and updated if appropriate.  Family History:  The patient's family history includes Colon cancer in his father; Diabetes in his father; Sickle cell anemia in his mother; Stomach cancer in his father.  Medical History: Past Medical History:  Diagnosis Date   Adenomatous colon polyp    Allergy    Anal fissure    Arthritis    BPH (benign prostatic hyperplasia)    Diabetes mellitus (HCC)    GERD (gastroesophageal reflux disease)    Hyperlipidemia    Hypertension    Hypothyroidism    Other testicular hypofunction    Stroke [facility])     Surgical History: Past Surgical History:  Procedure Laterality Date   CIRCUMCISION N/A 06/11/2019   Procedure: CIRCUMCISION ADULT;  Surgeon: Nieves Cough, MD;  Location: [facility] ORS;  Service: Urology;  Laterality: N/A;   COLONOSCOPY     INGUINAL HERNIA REPAIR Bilateral    1968 and 2004   IR THORACENTESIS ASP PLEURAL SPACE W/IMG GUIDE  05/08/2024   LOOP RECORDER  INSERTION N/A 07/28/2017   Procedure: LOOP RECORDER INSERTION;  Surgeon: Fernande Elspeth BROCKS, MD;  Location: [facility] INVASIVE CV LAB;  Service: Cardiovascular;  Laterality: N/A;   RIGHT/LEFT HEART CATH AND CORONARY ANGIOGRAPHY N/A 10/07/2021   Procedure: RIGHT/LEFT HEART CATH AND CORONARY ANGIOGRAPHY;  Surgeon: Rolan Ezra RAMAN, MD;  Location: [facility] INVASIVE CV LAB;  Service: Cardiovascular;  Laterality: N/A;   TEE WITHOUT CARDIOVERSION N/A 07/28/2017   Procedure: TRANSESOPHAGEAL ECHOCARDIOGRAM (TEE);  Surgeon: Shlomo Wilbert SAUNDERS, MD;  Location: [facility] ENDOSCOPY;  Service: Cardiovascular;  Laterality: N/A;     Medications:   Current Facility-Administered Medications:    acetaminophen  (TYLENOL ) tablet 650 mg, 650 mg, Oral, Q6H PRN, 650 mg at 05/05/24 1240 **OR** acetaminophen  (TYLENOL ) suppository 650 mg, 650 mg, Rectal, Q6H PRN, Sundil, Subrina, MD   [COMPLETED] amiodarone  (PACERONE ) tablet 200  mg, 200 mg, Oral, BID, 200 mg at 03/16/24 0849 **FOLLOWED BY** amiodarone  (PACERONE ) tablet 200 mg, 200 mg, Oral, Daily, O'Neal, Darryle Ned, MD, 200 mg at 05/23/24 1003   apixaban  (ELIQUIS ) tablet 5 mg, 5 mg, Oral, BID, Adhikari, Amrit, MD, 5 mg at 05/23/24 1002   ascorbic acid  (VITAMIN C ) tablet 500 mg, 500 mg, Oral, BID, Pahwani, Ravi, MD, 500 mg at 05/23/24 1002   atorvastatin  (LIPITOR) tablet 40 mg, 40 mg, Oral, Daily, Sundil, Subrina, MD, 40 mg at 05/23/24 1003   Chlorhexidine  Gluconate Cloth 2 % PADS 6 each, 6 each, Topical, Daily, Jillian Buttery, MD, 6 each at 05/23/24 1004   collagenase  (SANTYL ) ointment, , Topical, Daily, Amin, Sumayya, MD, Given at 05/23/24 1005   diclofenac  Sodium (VOLTAREN ) 1 % topical gel 4 g, 4 g, Topical, QID, Akula, Vijaya, MD, 4 g at 05/23/24 1006   feeding supplement (ENSURE ENLIVE / ENSURE PLUS) liquid 237 mL, 237 mL, Oral, BID BM, Pahwani, Ravi, MD, 237 mL at 05/23/24 1007   ferrous sulfate  tablet 325 mg, 325 mg, Oral, QHS, Sundil, Subrina, MD, 325 mg at 05/22/24 2319   food thickener  (SIMPLYTHICK (HONEY/LEVEL 3/MODERATELY THICK)) 10 packet, 10 packet, Oral, PRN, Akula, Vijaya, MD   furosemide  (LASIX ) injection 40 mg, 40 mg, Intravenous, Daily, 40 mg at 05/23/24 1004 **AND** potassium chloride  SA (KLOR-CON  M) CR tablet 40 mEq, 40 mEq, Oral, Daily, Briana Elgin LABOR, MD, 40 mEq at 05/23/24 1003   guaiFENesin  (MUCINEX ) 12 hr tablet 600 mg, 600 mg, Oral, BID, Adhikari, Amrit, MD, 600 mg at 05/23/24 1002   hydrOXYzine  (ATARAX ) tablet 10 mg, 10 mg, Oral, TID PRN, Cooper, Josseline P, PA-C, 10 mg at 05/22/24 2319   insulin  aspart (novoLOG ) injection 0-5 Units, 0-5 Units, Subcutaneous, QHS, Sreeram, Narendranath, MD, 3 Units at 05/11/24 2248   insulin  aspart (novoLOG ) injection 0-9 Units, 0-9 Units, Subcutaneous, TID WC, Sreeram, Narendranath, MD, 2 Units at 05/22/24 1645   insulin  glargine-yfgn (SEMGLEE ) injection 8 Units, 8 Units, Subcutaneous, Daily, Sreeram, Narendranath, MD, 8 Units at 05/23/24 1001   levothyroxine  (SYNTHROID ) tablet 50 mcg, 50 mcg, Oral, Q0600, Sundil, Subrina, MD, 50 mcg at 05/23/24 0539   lip balm (CARMEX) ointment 1 Application, 1 Application, Topical, PRN, Jillian Buttery, MD   liver oil-zinc  oxide (DESITIN) 40 % ointment, , Topical, BID, Sundil, Subrina, MD, Given at 05/23/24 1008   multivitamin with minerals tablet 1 tablet, 1 tablet, Oral, Daily, Odell Celinda Balo, MD, 1 tablet at 05/23/24 1003   nutrition supplement (JUVEN) (JUVEN) powder packet 1 packet, 1 packet, Oral, BID BM, Jillian Buttery, MD, 1 packet at 05/23/24 0957   ondansetron  (ZOFRAN ) tablet 4 mg, 4 mg, Oral, Q6H PRN **OR** ondansetron  (ZOFRAN ) injection 4 mg, 4 mg, Intravenous, Q6H PRN, Sundil, Subrina, MD   Oral care mouth rinse, 15 mL, Mouth Rinse, 4 times per day, Perri LABOR Meliton Mickey., MD, 15 mL at 05/23/24 0800   Oral care mouth rinse, 15 mL, Mouth Rinse, PRN, Perri LABOR Meliton Mickey., MD   Oral care mouth rinse, 15 mL, Mouth Rinse, 4 times per day, Briana Elgin LABOR, MD, 15 mL at 05/23/24  0800   Oral care mouth rinse, 15 mL, Mouth Rinse, PRN, Briana Elgin LABOR, MD   oxyCODONE  (Oxy IR/ROXICODONE ) immediate release tablet 2.5 mg, 2.5 mg, Oral, Q4H PRN, Cooper, Josseline P, PA-C, 2.5 mg at 05/20/24 1118   polyethylene glycol (MIRALAX  / GLYCOLAX ) packet 17 g, 17 g, Oral, Daily PRN, Sebastian Toribio GAILS, MD, 17 g  at 03/31/24 1024   polyethylene glycol (MIRALAX  / GLYCOLAX ) packet 17 g, 17 g, Oral, BID, Perri DELENA Meliton Mickey., MD, 17 g at 05/23/24 0957   polyvinyl alcohol  (LIQUIFILM TEARS) 1.4 % ophthalmic solution 1 drop, 1 drop, Both Eyes, PRN, Odell Celinda Balo, MD, 1 drop at 05/02/24 1725   senna-docusate (Senokot-S) tablet 1 tablet, 1 tablet, Oral, QHS, Akula, Vijaya, MD, 1 tablet at 05/21/24 2300   sertraline  (ZOLOFT ) tablet 25 mg, 25 mg, Oral, Daily, Cindy Garnette POUR, MD, 25 mg at 05/23/24 1002   sodium chloride  flush (NS) 0.9 % injection 10-40 mL, 10-40 mL, Intracatheter, Q12H, Sreeram, Narendranath, MD, 10 mL at 05/23/24 1009   sodium hypochlorite (DAKIN'S 1/4 STRENGTH) topical solution, , Irrigation, Daily, Darci Pore, MD, Given at 05/22/24 0933   Tafamidis  CAPS 61 mg, 1 capsule, Oral, Daily, Adhikari, Amrit, MD, 61 mg at 05/22/24 1639   vitamin A  capsule 10,000 Units, 10,000 Units, Oral, Daily, Pahwani, Ravi, MD, 10,000 Units at 05/23/24 1003   vutrisiran  (Amvuttra ) 25 mg/0.5 ml syringe - patient's own supply, 25 mg, Subcutaneous, Q90 days, Darci Pore, MD, 25 mg at 04/29/24 2339   zinc  sulfate (50mg  elemental zinc ) capsule 220 mg, 220 mg, Oral, Daily, Pahwani, Ravi, MD, 220 mg at 05/23/24 1003  Allergies: Allergies  Allergen Reactions   Penicillins Other (See Comments)    Did it involve swelling of the face/tongue/throat, SOB, or low BP? Unknown Did it involve sudden or severe rash/hives, skin peeling, or any reaction on the inside of your mouth or nose? Unknown Did you need to seek medical attention at a hospital or doctor's office? No When did it last  happen?    Over 80 Years Ago   If all above answers are NO, may proceed with cephalosporin use.     Karleen Kaufmann, MD PGY-4

## 2024-05-23 NOTE — Plan of Care (Signed)
   Medical records reviewed including progress notes, labs and imaging.  Agree with appropriateness of psychiatry consult for additional assistance with mood changes. Per MAR, patient remains on daily Zoloft  and PRN Atarax . He has declined escalating dosages in the past.   Albumin  stable at 3.0. LFTs improving slowly. Focus of care remains on wound healing and participation in therapies.  Goals of care are clear for medical optimization and return home to continue managing chronic illnesses.  PMT will continue to follow peripherally and remains available for additional palliative needs.  Thank you for your referral and allowing PMT to assist in [name]'s care.   Earl Disano, PA-C Palliative Medicine Team  Team Phone # [phone]   NO CHARGE

## 2024-05-23 NOTE — Progress Notes (Signed)
 Progress Note   Patient: Earl [name] FMW:[id number] DOB: February 26, 1941 DOA: 03/04/2024     80 DOS: the patient was seen and examined on 05/23/2024   Brief hospital course: 83 year old male with history of PAF on Eliquis , IDDM 2, HTN, CVA with right-sided residual weakness, MGUS, BPH, systolic CHF who is here with sacral pain. Of note, he was hospitalized 3/26-4/10 with generalized weakness/acute encephalopathy, hypotension, and was COVID PCR positive. He was eventually discharged to SNF and readmitted 4 days later with pain in the sacral area and shortness of breath. He was found to be in A-fib with RVR, tachypneic and was hypotensive on admission for which critical care was consulted. General surgery was consulted for sacral wound debridement status post bedside excision 4/15 and 03/07/2024.   Assessment and Plan: Sacral decubitus ulcer, stage IV Infected ulcer Pressure injuries present on admission Concern for infection on admission. Patient started empirically on vancomycin , cefepime , and Flagyl . CT on 6/16 concern for possible osteomyelitis. ID consulted with recommendation to discontinue antibiotics.  Surgery consulted and performed wound debridement during this hospitalization.  Plastic surgery was also consulted with recommendation for continued wound care and no surgical management. Continue current wound care Continue pressure relieving therapies. Advised to work with PT/OT. Psychiatry consult appreciated, started on Zoloft  for depression.   Severe sepsis Present on admission presumed secondary to sacral ulcer infection.  Patient managed with antibiotics.  Sepsis physiology resolved.   Acute on chronic pain Continue oxycodone  as needed Continue Tylenol  as needed. LFT improving.   Paroxysmal atrial fibrillation Continue amiodarone , Eliquis    Acute on chronic combined heart failure Anasarca He does have abdominal distention. Thigh swelling better. Resumed Lasix  IV as a daily  dose; increase if poor urine output Continue Tafamidis  and vutrisiran    Left pleural effusion Patient underwent left-sided thoracentesis on 6/18 yielded 600 mL of hazy amber fluid.  Body fluid culture with no growth.   Anemia of chronic disease Stable. Hb 11. CBC for tomorrow ordered.   Elevated LFTs No liver pathology seen on CT imaging.  LFT high possibly due to heart failure.   Hypernatremia Na improved.SABRA    Dysphagia Speech therapy recommendations (5/29):  Diet recommendations: Thin liquid;Dysphagia 1 (puree) SLP to follow.   Severe protein malnutrition Continue dietician recommendations, encourage diet, supplements. Dietician for diet recommendations as he is eating less.   Diabetes mellitus type 2 Poorly controlled with hyperglycemia based on hemoglobin A1C of 8.4%.  Continue Semglee  and SSI.        Out of bed to chair. Incentive spirometry. Nursing supportive care. Fall, aspiration precautions. Diet:  Diet Orders (From admission, onward)     Start     Ordered   04/30/24 1436  DIET - DYS 1 Room service appropriate? No; Fluid consistency: Thin  Diet effective now       Question Answer Comment  Room service appropriate? No   Fluid consistency: Thin      04/30/24 1436           DVT prophylaxis: SCDs Start: 03/04/24 2151 Place TED hose Start: 03/04/24 2151 apixaban  (ELIQUIS ) tablet 5 mg  Level of care: Telemetry Medical   Code Status: Limited: Do not attempt resuscitation (DNR) -DNR-LIMITED -Do Not Intubate/DNI   Subjective: Patient is seen and examined today morning. He is lying in bed, RN at bedside giving his meds. He has trouble with swallowing. Has abdominal distention. No family at bedside.  Physical Exam: Vitals:   05/23/24 0341 05/23/24 0500 05/23/24 0743 05/23/24 1130  BP: 108/67  120/65 131/78  Pulse: 71  70 90  Resp: 19  (!) 24 (!) 21  Temp: 98.5 F (36.9 C)  98 F (36.7 C) 98.2 F (36.8 C)  TempSrc: Oral  Oral   SpO2: 100%  96% 96%   Weight:  77.1 kg    Height:        General - Elderly African American ill male, mild respiratory distress while talking. HEENT - PERRLA, EOMI, atraumatic head, non tender sinuses. Lung - Clear, basal rales, rhonchi, wheezes. Heart - S1, S2 heard, no murmurs, rubs, pitting edema thigh, abdomen better. Abdomen - Soft, non tender, distended, bowel sounds good Neuro - Alert, awake and oriented, non focal exam. Skin - Warm and dry.  Data Reviewed:      Latest Ref Rng & Units 05/19/2024    5:44 AM 05/18/2024    4:42 AM 05/15/2024    2:02 PM  CBC  WBC 4.0 - 10.5 K/uL 5.1  5.4  5.7   Hemoglobin 13.0 - 17.0 g/dL 88.4  89.1  88.6   Hematocrit 39.0 - 52.0 % 35.8  33.1  34.5   Platelets 150 - 400 K/uL 249  236  241       Latest Ref Rng & Units 05/23/2024    3:13 AM 05/22/2024    4:23 AM 05/19/2024    5:44 AM  BMP  Glucose 70 - 99 mg/dL 87  721  879   BUN 8 - 23 mg/dL 33  37  28   Creatinine 0.61 - 1.24 mg/dL 8.97  9.01  9.04   Sodium 135 - 145 mmol/L 140  142  142   Potassium 3.5 - 5.1 mmol/L 4.6  4.6  3.2   Chloride 98 - 111 mmol/L 107  108  106   CO2 22 - 32 mmol/L 23  23  27    Calcium  8.9 - 10.3 mg/dL 9.1  9.1  8.4    No results found.  Family Communication: Discussed with patient, understand and agree. All questions answered.  Disposition: Status is: Inpatient Remains inpatient appropriate because: placement, wound care.  Planned Discharge Destination: Skilled nursing facility     Time spent: 40 minutes  Author: Concepcion Riser, MD 05/23/2024 3:19 PM Secure chat 7am to 7pm For on call review [email or web address].

## 2024-05-24 DIAGNOSIS — A419 Sepsis, unspecified organism: Secondary | ICD-10-CM | POA: Diagnosis not present

## 2024-05-24 DIAGNOSIS — L98429 Non-pressure chronic ulcer of back with unspecified severity: Secondary | ICD-10-CM | POA: Diagnosis not present

## 2024-05-24 DIAGNOSIS — K219 Gastro-esophageal reflux disease without esophagitis: Secondary | ICD-10-CM | POA: Diagnosis not present

## 2024-05-24 DIAGNOSIS — I480 Paroxysmal atrial fibrillation: Secondary | ICD-10-CM | POA: Diagnosis not present

## 2024-05-24 LAB — GLUCOSE, CAPILLARY
Glucose-Capillary: 98 mg/dL (ref 70–99)
Glucose-Capillary: 103 mg/dL — ABNORMAL HIGH (ref 70–99)
Glucose-Capillary: 149 mg/dL — ABNORMAL HIGH (ref 70–99)
Glucose-Capillary: 173 mg/dL — ABNORMAL HIGH (ref 70–99)

## 2024-05-24 LAB — COMPREHENSIVE METABOLIC PANEL WITH GFR
ALT: 64 U/L — ABNORMAL HIGH (ref 0–44)
AST: 51 U/L — ABNORMAL HIGH (ref 15–41)
Albumin: 3 g/dL — ABNORMAL LOW (ref 3.5–5.0)
Alkaline Phosphatase: 142 U/L — ABNORMAL HIGH (ref 38–126)
Anion gap: 12 (ref 5–15)
BUN: 41 mg/dL — ABNORMAL HIGH (ref 8–23)
CO2: 22 mmol/L (ref 22–32)
Calcium: 9.4 mg/dL (ref 8.9–10.3)
Chloride: 107 mmol/L (ref 98–111)
Creatinine, Ser: 1.14 mg/dL (ref 0.61–1.24)
GFR, Estimated: >60 mL/min (ref >60)
Glucose, Bld: 103 mg/dL — ABNORMAL HIGH (ref 70–99)
Potassium: 4.9 mmol/L (ref 3.5–5.1)
Sodium: 141 mmol/L (ref 135–145)
Total Bilirubin: 1.1 mg/dL (ref 0.0–1.2)
Total Protein: 5.9 g/dL — ABNORMAL LOW (ref 6.5–8.1)

## 2024-05-24 LAB — CBC
HCT: 36.4 % — ABNORMAL LOW (ref 39.0–52.0)
Hemoglobin: 11.8 g/dL — ABNORMAL LOW (ref 13.0–17.0)
MCH: 29.1 pg (ref 26.0–34.0)
MCHC: 32.4 g/dL (ref 30.0–36.0)
MCV: 89.7 fL (ref 80.0–100.0)
Platelets: 263 K/uL (ref 150–400)
RBC: 4.06 MIL/uL — ABNORMAL LOW (ref 4.22–5.81)
RDW: 18.9 % — ABNORMAL HIGH (ref 11.5–15.5)
WBC: 6.3 K/uL (ref 4.0–10.5)
nRBC: 0 % (ref 0.0–0.2)

## 2024-05-24 NOTE — Plan of Care (Signed)
 Assumed care at 1900. Pt has expressed complaints of wanting to sleep and pain. See MAR. Pt had no appetite to have dinner. He was able to consume the yogurt. Meds needed to be crushed. Patient seems to be reverting back to not swallowing completely and coughing after consumption. This RN believes SLP should be reconsidered. Patient was tearful overnight and had trouble getting his words out. Pt had a BM this morning. Daughter updated.    Problem: Fluid Volume: Goal: Hemodynamic stability will improve 05/24/2024 0554 by Patrick Leotis LABOR, RN Outcome: Not Progressing 05/24/2024 0552 by Patrick Leotis LABOR, RN Outcome: Progressing   Problem: Respiratory: Goal: Ability to maintain adequate ventilation will improve 05/24/2024 0554 by Patrick Leotis LABOR, RN Outcome: Not Progressing 05/24/2024 0552 by Patrick Leotis LABOR, RN Outcome: Progressing   Problem: Respiratory: Goal: Will maintain a patent airway 05/24/2024 0554 by Patrick Leotis LABOR, RN Outcome: Not Progressing 05/24/2024 0552 by Patrick Leotis LABOR, RN Outcome: Progressing Goal: Complications related to the disease process, condition or treatment will be avoided or minimized 05/24/2024 0554 by Patrick Leotis LABOR, RN Outcome: Not Progressing 05/24/2024 0552 by Patrick Leotis LABOR, RN Outcome: Progressing   Problem: Coping: Goal: Ability to adjust to condition or change in health will improve 05/24/2024 0554 by Patrick Leotis LABOR, RN Outcome: Not Progressing 05/24/2024 0552 by Patrick Leotis LABOR, RN Outcome: Progressing   Problem: Skin Integrity: Goal: Risk for impaired skin integrity will decrease 05/24/2024 0554 by Patrick Leotis LABOR, RN Outcome: Not Progressing 05/24/2024 0552 by Patrick Leotis LABOR, RN Outcome: Progressing

## 2024-05-24 NOTE — Progress Notes (Signed)
 Physical Therapy Wound Treatment Patient Details  Name: Earl [name] MRN: [id number] Date of Birth: [birth year]  Today's Date: 05/24/2024 Time: 1013-1051 Time Calculation (min): 38 min  Subjective  Subjective Assessment Subjective: WOC RN in room for assessment prior to PT wound session Patient and Family Stated Goals: Heal wounds Date of Onset:  (unknown) Prior Treatments: Dressing changes, off weighting  Pain Score:  9/10  Wound Assessment  Pressure Injury 02/26/24 Coccyx Mid Stage 4 - Full thickness tissue loss with exposed bone, tendon or muscle. (Active)  Pressure Injury 03/06/24 Hip Anterior;Left;Proximal Unstageable - Full thickness tissue loss in which the base of the injury is covered by slough (yellow, tan, gray, green or brown) and/or eschar (tan, brown or black) in the wound bed. unstageable (Active)  Wound Image   05/21/24 1100  Dressing Type Foam - Lift dressing to assess site every shift 05/24/24 1200  Dressing Changed;Clean, Dry, Intact 05/24/24 1200  Dressing Change Frequency Daily 05/24/24 1200  State of Healing Non-healing 05/24/24 1200  Site / Wound Assessment Pink;Red;Yellow 05/24/24 1200  % Wound base Red or Granulating 10% 05/24/24 1200  % Wound base Yellow/Fibrinous Exudate 80% 05/24/24 1200  % Wound base Black/Eschar 0% 05/21/24 1100  % Wound base Other/Granulation Tissue (Comment) 10% 05/24/24 1200  Peri-wound Assessment Intact;Pink 05/24/24 1200  Wound Length (cm) 4.4 cm 05/21/24 1100  Wound Width (cm) 3 cm 05/21/24 1100  Wound Depth (cm) 1.9 cm 05/21/24 1100  Wound Surface Area (cm^2) 13.2 cm^2 05/21/24 1100  Wound Volume (cm^3) 25.08 cm^3 05/21/24 1100  Tunneling (cm) 0 05/17/24 1457  Undermining (cm) ~0.2cm 12:00-1:00 05/21/24 1100  Margins Unattached edges (unapproximated) 05/24/24 1200  Drainage Amount Minimal 05/24/24 1200  Drainage Description Purulent 05/24/24 1200  Treatment Cleansed;Other (Comment) 05/24/24 1200      Selective  Debridement (non-excisional) Selective Debridement (non-excisional) - Location: L hip Selective Debridement (non-excisional) - Tools Used: Forceps, Scalpel Selective Debridement (non-excisional) - Tissue Removed: Slough non-viable adipose tissue    Wound Assessment and Plan  Wound Therapy - Assess/Plan/Recommendations Wound Therapy - Clinical Statement: Adipose in wound bed has become nonviable in the presence of Dakin's solution. Pt premedicated but not tolerating too much debridement today Dakin's until d/c 7/8 Wound Therapy - Functional Problem List: Decreased tolerance for position changes or OOB. Factors Delaying/Impairing Wound Healing: Diabetes Mellitus, Immobility, Infection - systemic/local, Multiple medical problems Hydrotherapy Plan: Debridement, Dressing change, Patient/family education Wound Therapy - Frequency: 2X / week Wound Therapy - Follow Up Recommendations: dressing changes by RN  Wound Therapy Goals- Improve the function of patient's integumentary system by progressing the wound(s) through the phases of wound healing (inflammation - proliferation - remodeling) by: Wound Therapy Goals - Improve the function of patient's integumentary system by progressing the wound(s) through the phases of wound healing by: Decrease Necrotic Tissue to: 20% Decrease Necrotic Tissue - Progress: Not progressing Increase Granulation Tissue to: 80% Increase Granulation Tissue - Progress: Not progressing Goals/treatment plan/discharge plan were made with and agreed upon by patient/family: Yes Time For Goal Achievement: 7 days Wound Therapy - Potential for Goals: Fair  Goals will be updated until maximal potential achieved or discharge criteria met.  Discharge criteria: when goals achieved, discharge from hospital, MD decision/surgical intervention, no progress towards goals, refusal/missing three consecutive treatments without notification or medical reason.  GP Earl [name]. [name] PT,  DPT Acute Rehabilitation Services Please use secure chat or  Call Office [phone]     Charges PT Wound Care Charges $Wound Debridement up  to 20 cm: < or equal to 20 cm $ Wound Debridement each add'l 20 sqcm: 1 $PT Hydrotherapy Dressing: 1 dressing $PT Hydrotherapy Visit: 1 Visit       Earl [name] Earl [name] Earl [name] 05/24/2024, 1:15 PM

## 2024-05-24 NOTE — Plan of Care (Signed)
   Problem: Fluid Volume: Goal: Hemodynamic stability will improve Outcome: Progressing   Problem: Clinical Measurements: Goal: Diagnostic test results will improve Outcome: Progressing Goal: Signs and symptoms of infection will decrease Outcome: Progressing   Problem: Respiratory: Goal: Ability to maintain adequate ventilation will improve Outcome: Progressing

## 2024-05-24 NOTE — Progress Notes (Signed)
 Progress Note   Patient: Earl [name] FMW:[id number] DOB: 30-Jan-1941 DOA: 03/04/2024     83 DOS: the patient was seen and examined on 05/24/2024   Brief hospital course: 83 year old male with history of PAF on Eliquis , IDDM 2, HTN, CVA with right-sided residual weakness, MGUS, BPH, systolic CHF who is here with sacral pain. Of note, he was hospitalized 3/26-4/10 with generalized weakness/acute encephalopathy, hypotension, and was COVID PCR positive. He was eventually discharged to SNF and readmitted 4 days later with pain in the sacral area and shortness of breath. He was found to be in A-fib with RVR, tachypneic and was hypotensive on admission for which critical care was consulted. General surgery was consulted for sacral wound debridement status post bedside excision 4/15 and 03/07/2024.   Assessment and Plan: Sacral decubitus ulcer, stage IV Infected ulcer Pressure injuries present on admission Concern for infection on admission. Patient started empirically on vancomycin , cefepime , and Flagyl . CT on 6/16 concern for possible osteomyelitis. ID consulted with recommendation to discontinue antibiotics.  Surgery consulted and performed wound debridement during this hospitalization.  Plastic surgery was also consulted with recommendation for continued wound care and no surgical management. Continue current wound care Continue pressure relieving therapies. Advised to work with PT/OT. Psychiatry advised Zoloft  for depression.   Severe sepsis Present on admission presumed secondary to sacral ulcer infection.  Patient managed with antibiotics.  Sepsis physiology resolved. Continue wound care.   Acute on chronic pain Continue oxycodone  as needed Continue Tylenol  as needed. LFT improving.   Paroxysmal atrial fibrillation Continue amiodarone , Eliquis    Acute on chronic combined heart failure Anasarca Abdominal distention persists. Thigh swelling better. Resumed Lasix  IV as a daily dose;  increase if poor urine output Continue Tafamidis  and vutrisiran    Left pleural effusion Patient underwent left-sided thoracentesis on 6/18 yielded 600 mL of hazy amber fluid.  Body fluid culture with no growth.   Anemia of chronic disease Stable. Hb above 11.   Elevated LFTs No liver pathology seen on CT imaging.  LFT high possibly due to heart failure.   Hypernatremia Na improved and stable.    Dysphagia Speech therapy following. Diet recommendations: Thin liquid;Dysphagia 1 (puree)   Severe protein malnutrition Continue dietician recommendations, encourage diet, supplements. Dietician for diet recommendations as he is eating less.   Diabetes mellitus type 2 Poorly controlled with hyperglycemia based on hemoglobin A1C of 8.4%.  Continue Semglee  and SSI.      Out of bed to chair. Incentive spirometry. Nursing supportive care. Fall, aspiration precautions. Diet:  Diet Orders (From admission, onward)     Start     Ordered   04/30/24 1436  DIET - DYS 1 Room service appropriate? No; Fluid consistency: Thin  Diet effective now       Question Answer Comment  Room service appropriate? No   Fluid consistency: Thin      04/30/24 1436           DVT prophylaxis: SCDs Start: 03/04/24 2151 Place TED hose Start: 03/04/24 2151 apixaban  (ELIQUIS ) tablet 5 mg  Level of care: Telemetry Medical   Code Status: Limited: Do not attempt resuscitation (DNR) -DNR-LIMITED -Do Not Intubate/DNI   Subjective: Patient is seen and examined today morning. He is lying in bed, Wound care at bedside, changing dressing. No family at bedside.  Physical Exam: Vitals:   05/24/24 0446 05/24/24 0448 05/24/24 0716 05/24/24 1231  BP: 99/64  127/71 120/86  Pulse: 71  74 79  Resp:   16 20  Temp: 97.6 F (36.4 C)  (!) 97.5 F (36.4 C) (!) 97.4 F (36.3 C)  TempSrc: Oral  Oral   SpO2: 100%  100% 99%  Weight:  77.1 kg    Height:        General - 83 African American ill male, mild  respiratory distress. HEENT - PERRLA, EOMI, atraumatic head, non tender sinuses. Lung - Clear, basal rales, rhonchi, wheezes. Heart - S1, S2 heard, no murmurs, rubs, pitting edema thigh, abdomen better. Abdomen - Soft, non tender, distended, bowel sounds good Neuro - Alert, awake and oriented, non focal exam. Skin - Warm and dry.  Data Reviewed:      Latest Ref Rng & Units 05/24/2024    3:04 AM 05/19/2024    5:44 AM 05/18/2024    4:42 AM  CBC  WBC 4.0 - 10.5 K/uL 6.3  5.1  5.4   Hemoglobin 13.0 - 17.0 g/dL 88.1  88.4  89.1   Hematocrit 39.0 - 52.0 % 36.4  35.8  33.1   Platelets 150 - 400 K/uL 263  249  236       Latest Ref Rng & Units 05/24/2024    3:04 AM 05/23/2024    3:13 AM 05/22/2024    4:23 AM  BMP  Glucose 70 - 99 mg/dL 896  87  721   BUN 8 - 23 mg/dL 41  33  37   Creatinine 0.61 - 1.24 mg/dL 8.85  8.97  9.01   Sodium 135 - 145 mmol/L 141  140  142   Potassium 3.5 - 5.1 mmol/L 4.9  4.6  4.6   Chloride 98 - 111 mmol/L 107  107  108   CO2 22 - 32 mmol/L 22  23  23    Calcium  8.9 - 10.3 mg/dL 9.4  9.1  9.1    No results found.  Family Communication: Discussed with patient, understand and agree. All questions answered.  Disposition: Status is: Inpatient Remains inpatient appropriate because: placement, wound care.  Planned Discharge Destination: Skilled nursing facility     Time spent: 39 minutes  Author: Concepcion Riser, MD 05/24/2024 3:50 PM Secure chat 7am to 7pm For on call review [email or web address].

## 2024-05-25 DIAGNOSIS — L98429 Non-pressure chronic ulcer of back with unspecified severity: Secondary | ICD-10-CM | POA: Diagnosis not present

## 2024-05-25 DIAGNOSIS — K219 Gastro-esophageal reflux disease without esophagitis: Secondary | ICD-10-CM | POA: Diagnosis not present

## 2024-05-25 DIAGNOSIS — I480 Paroxysmal atrial fibrillation: Secondary | ICD-10-CM | POA: Diagnosis not present

## 2024-05-25 DIAGNOSIS — A419 Sepsis, unspecified organism: Secondary | ICD-10-CM | POA: Diagnosis not present

## 2024-05-25 LAB — GLUCOSE, CAPILLARY
Glucose-Capillary: 125 mg/dL — ABNORMAL HIGH (ref 70–99)
Glucose-Capillary: 220 mg/dL — ABNORMAL HIGH (ref 70–99)
Glucose-Capillary: 122 mg/dL — ABNORMAL HIGH (ref 70–99)
Glucose-Capillary: 111 mg/dL — ABNORMAL HIGH (ref 70–99)

## 2024-05-25 LAB — CBC
HCT: 36.1 % — ABNORMAL LOW (ref 39.0–52.0)
Hemoglobin: 11.6 g/dL — ABNORMAL LOW (ref 13.0–17.0)
MCH: 28.9 pg (ref 26.0–34.0)
MCHC: 32.1 g/dL (ref 30.0–36.0)
MCV: 90 fL (ref 80.0–100.0)
Platelets: 254 K/uL (ref 150–400)
RBC: 4.01 MIL/uL — ABNORMAL LOW (ref 4.22–5.81)
RDW: 18.9 % — ABNORMAL HIGH (ref 11.5–15.5)
WBC: 4.8 K/uL (ref 4.0–10.5)
nRBC: 0 % (ref 0.0–0.2)

## 2024-05-25 NOTE — Plan of Care (Signed)
  Problem: Fluid Volume: Goal: Hemodynamic stability will improve Outcome: Progressing   Problem: Clinical Measurements: Goal: Diagnostic test results will improve Outcome: Progressing Goal: Signs and symptoms of infection will decrease Outcome: Progressing   Problem: Respiratory: Goal: Ability to maintain adequate ventilation will improve Outcome: Progressing   Problem: Education: Goal: Knowledge of risk factors and measures for prevention of condition will improve Outcome: Progressing   Problem: Coping: Goal: Psychosocial and spiritual needs will be supported Outcome: Progressing   Problem: Respiratory: Goal: Will maintain a patent airway Outcome: Progressing Goal: Complications related to the disease process, condition or treatment will be avoided or minimized Outcome: Progressing   Problem: Education: Goal: Ability to describe self-care measures that may prevent or decrease complications (Diabetes Survival Skills Education) will improve Outcome: Progressing   Problem: Coping: Goal: Ability to adjust to condition or change in health will improve Outcome: Progressing   Problem: Fluid Volume: Goal: Ability to maintain a balanced intake and output will improve Outcome: Progressing   Problem: Health Behavior/Discharge Planning: Goal: Ability to manage health-related needs will improve Outcome: Progressing   Problem: Metabolic: Goal: Ability to maintain appropriate glucose levels will improve Outcome: Progressing   Problem: Nutritional: Goal: Maintenance of adequate nutrition will improve Outcome: Progressing Goal: Progress toward achieving an optimal weight will improve Outcome: Progressing   Problem: Skin Integrity: Goal: Risk for impaired skin integrity will decrease Outcome: Progressing   Problem: Tissue Perfusion: Goal: Adequacy of tissue perfusion will improve Outcome: Progressing   Problem: Education: Goal: Knowledge of General Education information  will improve Description: Including pain rating scale, medication(s)/side effects and non-pharmacologic comfort measures Outcome: Progressing   Problem: Health Behavior/Discharge Planning: Goal: Ability to manage health-related needs will improve Outcome: Progressing   Problem: Clinical Measurements: Goal: Ability to maintain clinical measurements within normal limits will improve Outcome: Progressing Goal: Will remain free from infection Outcome: Progressing Goal: Diagnostic test results will improve Outcome: Progressing Goal: Respiratory complications will improve Outcome: Progressing Goal: Cardiovascular complication will be avoided Outcome: Progressing   Problem: Activity: Goal: Risk for activity intolerance will decrease Outcome: Progressing   Problem: Nutrition: Goal: Adequate nutrition will be maintained Outcome: Progressing   Problem: Coping: Goal: Level of anxiety will decrease Outcome: Progressing   Problem: Elimination: Goal: Will not experience complications related to bowel motility Outcome: Progressing Goal: Will not experience complications related to urinary retention Outcome: Progressing   Problem: Pain Managment: Goal: General experience of comfort will improve and/or be controlled Outcome: Progressing   Problem: Safety: Goal: Ability to remain free from injury will improve Outcome: Progressing   Problem: Skin Integrity: Goal: Risk for impaired skin integrity will decrease Outcome: Progressing

## 2024-05-25 NOTE — Progress Notes (Signed)
 Progress Note   Patient: Earl [name] FMW:[id number] DOB: 11/22/1940 DOA: 03/04/2024     82 DOS: the patient was seen and examined on 05/25/2024   Brief hospital course: 83 year old male with history of PAF on Eliquis , IDDM 2, HTN, CVA with right-sided residual weakness, MGUS, BPH, systolic CHF who is here with sacral pain. Of note, he was hospitalized 3/26-4/10 with generalized weakness/acute encephalopathy, hypotension, and was COVID PCR positive. He was eventually discharged to SNF and readmitted 4 days later with pain in the sacral area and shortness of breath. He was found to be in A-fib with RVR, tachypneic and was hypotensive on admission for which critical care was consulted. General surgery was consulted for sacral wound debridement status post bedside excision 4/15 and 03/07/2024.   Assessment and Plan: Sacral decubitus ulcer, stage IV Infected ulcer Pressure injuries present on admission Concern for infection on admission. Patient started empirically on vancomycin , cefepime , and Flagyl . CT on 6/16 concern for possible osteomyelitis. ID consulted with recommendation to discontinue antibiotics.  Surgery consulted and performed wound debridement during this hospitalization.  Plastic surgery was also consulted with recommendation for continued wound care and no surgical management. Continue current wound care Continue pressure relieving therapies. Advised to work with PT/OT. Psychiatry advised Zoloft  for depression.   Severe sepsis Present on admission presumed secondary to sacral ulcer infection.  Patient managed with antibiotics.  Sepsis physiology resolved. Continue wound care.   Acute on chronic pain Continue oxycodone  as needed Continue Tylenol  as needed. LFT improving.   Paroxysmal atrial fibrillation Continue amiodarone , Eliquis    Acute on chronic combined heart failure Anasarca Abdominal distention persists. Thigh swelling better. Resumed Lasix  IV as a daily dose;  increase if poor urine output Continue Tafamidis  and vutrisiran    Left pleural effusion Patient underwent left-sided thoracentesis on 6/18 yielded 600 mL of hazy amber fluid.  Body fluid culture with no growth.   Anemia of chronic disease Stable. Hb above 11.   Elevated LFTs No liver pathology seen on CT imaging.  LFT high possibly due to heart failure.   Hypernatremia Na improved and stable.    Dysphagia Speech therapy following. Diet recommendations: Thin liquid;Dysphagia 1 (puree)   Severe protein malnutrition Continue dietician recommendations, encourage diet, supplements. Dietician for diet recommendations as he is eating less.   Diabetes mellitus type 2 Poorly controlled with hyperglycemia based on hemoglobin A1C of 8.4%.  Continue Semglee  and SSI.      Out of bed to chair. Incentive spirometry. Nursing supportive care. Fall, aspiration precautions. Diet:  Diet Orders (From admission, onward)     Start     Ordered   04/30/24 1436  DIET - DYS 1 Room service appropriate? No; Fluid consistency: Thin  Diet effective now       Question Answer Comment  Room service appropriate? No   Fluid consistency: Thin      04/30/24 1436           DVT prophylaxis: SCDs Start: 03/04/24 2151 Place TED hose Start: 03/04/24 2151 apixaban  (ELIQUIS ) tablet 5 mg  Level of care: Telemetry Medical   Code Status: Limited: Do not attempt resuscitation (DNR) -DNR-LIMITED -Do Not Intubate/DNI   Subjective: Patient is seen and examined today morning. He is lying in bed, talking slow. Has headache. No family at bedside.  Physical Exam: Vitals:   05/25/24 0449 05/25/24 0500 05/25/24 0829 05/25/24 1114  BP: 107/66  117/73 118/81  Pulse: 71  68 67  Resp:   17 18  Temp: 98  F (36.7 C)  (!) 97.5 F (36.4 C) (!) 97.5 F (36.4 C)  TempSrc: Oral     SpO2: 100%  98% 98%  Weight:  72.6 kg    Height:        General - Elderly African American ill male, mild respiratory  distress. HEENT - PERRLA, EOMI, atraumatic head, non tender sinuses. Lung - Clear, basal rales, rhonchi, wheezes. Heart - S1, S2 heard, no murmurs, rubs, pitting edema thigh, abdomen better. Abdomen - Soft, non tender, distended, bowel sounds good Neuro - Alert, awake and oriented, non focal exam. Skin - Warm and dry.  Data Reviewed:      Latest Ref Rng & Units 05/25/2024    5:32 AM 05/24/2024    3:04 AM 05/19/2024    5:44 AM  CBC  WBC 4.0 - 10.5 K/uL 4.8  6.3  5.1   Hemoglobin 13.0 - 17.0 g/dL 88.3  88.1  88.4   Hematocrit 39.0 - 52.0 % 36.1  36.4  35.8   Platelets 150 - 400 K/uL 254  263  249       Latest Ref Rng & Units 05/24/2024    3:04 AM 05/23/2024    3:13 AM 05/22/2024    4:23 AM  BMP  Glucose 70 - 99 mg/dL 896  87  721   BUN 8 - 23 mg/dL 41  33  37   Creatinine 0.61 - 1.24 mg/dL 8.85  8.97  9.01   Sodium 135 - 145 mmol/L 141  140  142   Potassium 3.5 - 5.1 mmol/L 4.9  4.6  4.6   Chloride 98 - 111 mmol/L 107  107  108   CO2 22 - 32 mmol/L 22  23  23    Calcium  8.9 - 10.3 mg/dL 9.4  9.1  9.1    No results found.  F Disposition: Status is: Inpatient Remains inpatient appropriate because: placement, wound care.  Planned Discharge Destination: Skilled nursing facility     Time spent: 39 minutes  Author: Concepcion Riser, MD 05/25/2024 3:10 PM Secure chat 7am to 7pm For on call review [email or web address].

## 2024-05-25 NOTE — Plan of Care (Signed)
  Problem: Coping: Goal: Psychosocial and spiritual needs will be supported Outcome: Progressing   Problem: Respiratory: Goal: Will maintain a patent airway Outcome: Progressing   Problem: Fluid Volume: Goal: Ability to maintain a balanced intake and output will improve Outcome: Progressing   Problem: Skin Integrity: Goal: Risk for impaired skin integrity will decrease Outcome: Progressing   Problem: Education: Goal: Knowledge of General Education information will improve Description: Including pain rating scale, medication(s)/side effects and non-pharmacologic comfort measures Outcome: Progressing   Problem: Pain Managment: Goal: General experience of comfort will improve and/or be controlled Outcome: Progressing   Problem: Skin Integrity: Goal: Risk for impaired skin integrity will decrease Outcome: Progressing

## 2024-05-26 DIAGNOSIS — L98429 Non-pressure chronic ulcer of back with unspecified severity: Secondary | ICD-10-CM | POA: Diagnosis not present

## 2024-05-26 DIAGNOSIS — K219 Gastro-esophageal reflux disease without esophagitis: Secondary | ICD-10-CM | POA: Diagnosis not present

## 2024-05-26 DIAGNOSIS — A419 Sepsis, unspecified organism: Secondary | ICD-10-CM | POA: Diagnosis not present

## 2024-05-26 DIAGNOSIS — I480 Paroxysmal atrial fibrillation: Secondary | ICD-10-CM | POA: Diagnosis not present

## 2024-05-26 LAB — CBC
HCT: 35.3 % — ABNORMAL LOW (ref 39.0–52.0)
Hemoglobin: 12 g/dL — ABNORMAL LOW (ref 13.0–17.0)
MCH: 30.1 pg (ref 26.0–34.0)
MCHC: 34 g/dL (ref 30.0–36.0)
MCV: 88.5 fL (ref 80.0–100.0)
Platelets: 207 K/uL (ref 150–400)
RBC: 3.99 MIL/uL — ABNORMAL LOW (ref 4.22–5.81)
RDW: 18.6 % — ABNORMAL HIGH (ref 11.5–15.5)
WBC: 5.9 K/uL (ref 4.0–10.5)
nRBC: 0 % (ref 0.0–0.2)

## 2024-05-26 LAB — GLUCOSE, CAPILLARY
Glucose-Capillary: 81 mg/dL (ref 70–99)
Glucose-Capillary: 156 mg/dL — ABNORMAL HIGH (ref 70–99)
Glucose-Capillary: 173 mg/dL — ABNORMAL HIGH (ref 70–99)
Glucose-Capillary: 138 mg/dL — ABNORMAL HIGH (ref 70–99)

## 2024-05-26 MED ORDER — FUROSEMIDE 10 MG/ML IJ SOLN
20.0000 mg | Freq: Every day | INTRAMUSCULAR | Status: DC
  Administered 2024-05-27 – 2024-05-28 (×2): 20 mg via INTRAVENOUS
  Filled 2024-05-26 (×2): qty 2

## 2024-05-26 MED ORDER — POTASSIUM CHLORIDE CRYS ER 20 MEQ PO TBCR
20.0000 meq | EXTENDED_RELEASE_TABLET | Freq: Every day | ORAL | Status: DC
  Administered 2024-05-27 – 2024-05-28 (×2): 20 meq via ORAL
  Filled 2024-05-26 (×2): qty 1

## 2024-05-26 NOTE — Plan of Care (Signed)
  Problem: Fluid Volume: Goal: Hemodynamic stability will improve Outcome: Progressing   Problem: Clinical Measurements: Goal: Diagnostic test results will improve Outcome: Progressing Goal: Signs and symptoms of infection will decrease Outcome: Progressing   Problem: Respiratory: Goal: Ability to maintain adequate ventilation will improve Outcome: Progressing   Problem: Education: Goal: Knowledge of risk factors and measures for prevention of condition will improve Outcome: Progressing   Problem: Coping: Goal: Psychosocial and spiritual needs will be supported Outcome: Progressing   Problem: Respiratory: Goal: Will maintain a patent airway Outcome: Progressing Goal: Complications related to the disease process, condition or treatment will be avoided or minimized Outcome: Progressing   Problem: Education: Goal: Ability to describe self-care measures that may prevent or decrease complications (Diabetes Survival Skills Education) will improve Outcome: Progressing   Problem: Coping: Goal: Ability to adjust to condition or change in health will improve Outcome: Progressing   Problem: Fluid Volume: Goal: Ability to maintain a balanced intake and output will improve Outcome: Progressing   Problem: Health Behavior/Discharge Planning: Goal: Ability to manage health-related needs will improve Outcome: Progressing   Problem: Metabolic: Goal: Ability to maintain appropriate glucose levels will improve Outcome: Progressing   Problem: Nutritional: Goal: Maintenance of adequate nutrition will improve Outcome: Progressing Goal: Progress toward achieving an optimal weight will improve Outcome: Progressing   Problem: Skin Integrity: Goal: Risk for impaired skin integrity will decrease Outcome: Progressing   Problem: Tissue Perfusion: Goal: Adequacy of tissue perfusion will improve Outcome: Progressing   Problem: Education: Goal: Knowledge of General Education information  will improve Description: Including pain rating scale, medication(s)/side effects and non-pharmacologic comfort measures Outcome: Progressing   Problem: Health Behavior/Discharge Planning: Goal: Ability to manage health-related needs will improve Outcome: Progressing   Problem: Clinical Measurements: Goal: Ability to maintain clinical measurements within normal limits will improve Outcome: Progressing Goal: Will remain free from infection Outcome: Progressing Goal: Diagnostic test results will improve Outcome: Progressing Goal: Respiratory complications will improve Outcome: Progressing Goal: Cardiovascular complication will be avoided Outcome: Progressing   Problem: Activity: Goal: Risk for activity intolerance will decrease Outcome: Progressing   Problem: Nutrition: Goal: Adequate nutrition will be maintained Outcome: Progressing   Problem: Coping: Goal: Level of anxiety will decrease Outcome: Progressing   Problem: Elimination: Goal: Will not experience complications related to bowel motility Outcome: Progressing Goal: Will not experience complications related to urinary retention Outcome: Progressing   Problem: Pain Managment: Goal: General experience of comfort will improve and/or be controlled Outcome: Progressing   Problem: Safety: Goal: Ability to remain free from injury will improve Outcome: Progressing   Problem: Skin Integrity: Goal: Risk for impaired skin integrity will decrease Outcome: Progressing

## 2024-05-26 NOTE — Progress Notes (Signed)
 Progress Note   Patient: Earl [name] FMW:[id number] DOB: Nov 16, 1941 DOA: 03/04/2024     83 DOS: the patient was seen and examined on 05/26/2024   Brief hospital course: 83 year old male with history of PAF on Eliquis , IDDM 2, HTN, CVA with right-sided residual weakness, MGUS, BPH, systolic CHF who is here with sacral pain. Of note, he was hospitalized 3/26-4/10 with generalized weakness/acute encephalopathy, hypotension, and was COVID PCR positive. He was eventually discharged to SNF and readmitted 4 days later with pain in the sacral area and shortness of breath. He was found to be in A-fib with RVR, tachypneic and was hypotensive on admission for which critical care was consulted. General surgery was consulted for sacral wound debridement status post bedside excision 4/15 and 03/07/2024.   Patient is medically appropriate for discharge at this time. CODE STATUS DNR-limited intervention. His overall prognosis is poor. Patient would like to continue current treatment, refuses to go to any facility and wishes to go home. Daughter wishes transition to home care once his wounds are better.  However given his debility, poor prognosis, SNF for full time care would be appropriate.   Assessment and Plan: Sacral decubitus ulcer, stage IV Infected ulcer Pressure injuries present on admission Concern for infection on admission. Patient started empirically on vancomycin , cefepime , and Flagyl . CT on 6/16 concern for possible osteomyelitis. ID consulted with recommendation to discontinue antibiotics.  Surgery consulted and performed wound debridement during this hospitalization.  Plastic surgery was also consulted with recommendation for continued wound care and no surgical management. Continue current wound care, continue pressure relieving therapies. He is very weak to work with PT/ PTT.  Psychiatry advised Zoloft  for depression.   Severe sepsis Present on admission presumed secondary to sacral ulcer  infection.  Patient finished antibiotic therapy.  Sepsis physiology resolved. Continue current wound care.   Acute on chronic pain Continue oxycodone  as needed Continue Tylenol  as needed. LFT stable.   Paroxysmal atrial fibrillation Continue amiodarone , Eliquis .   Acute on chronic combined heart failure Anasarca Abdominal distention, thigh swelling better. Continue Lasix  IV daily; increase if poor urine output Continue Tafamidis  and vutrisiran    Left pleural effusion Patient underwent left-sided thoracentesis on 6/18 yielded 600 mL of hazy amber fluid.  Body fluid culture with no growth.   Anemia of chronic disease Stable. Hb above 11.   Elevated LFTs No liver pathology seen on CT imaging.  LFT high possibly due to heart failure.   Hypernatremia Na improved and stable.    Dysphagia Speech therapy on board. Diet recommendations: Thin liquid; Dysphagia 1 (puree)   Severe protein malnutrition Continue dietician recommendations, encourage diet, supplements.   Diabetes mellitus type 2 Poorly controlled with hyperglycemia based on hemoglobin A1C of 8.4%.  Continue Semglee  and SSI.      Out of bed to chair. Incentive spirometry. Nursing supportive care. Fall, aspiration precautions. Diet:  Diet Orders (From admission, onward)     Start     Ordered   04/30/24 1436  DIET - DYS 1 Room service appropriate? No; Fluid consistency: Thin  Diet effective now       Question Answer Comment  Room service appropriate? No   Fluid consistency: Thin      04/30/24 1436           DVT prophylaxis: SCDs Start: 03/04/24 2151 Place TED hose Start: 03/04/24 2151 apixaban  (ELIQUIS ) tablet 5 mg  Level of care: Telemetry Medical   Code Status: Limited: Do not attempt resuscitation (DNR) -DNR-LIMITED -Do Not  Intubate/DNI   Subjective: Patient is seen and examined today morning. He is weak, eating poor. Sleeping comfortably. Denies any complaints.   Physical Exam: Vitals:    05/26/24 0437 05/26/24 0736 05/26/24 1152 05/26/24 1652  BP: 109/79 120/72 112/71 133/79  Pulse: (!) 57 75 66 78  Resp:      Temp: 98 F (36.7 C) 97.7 F (36.5 C) (!) 97.3 F (36.3 C) 97.7 F (36.5 C)  TempSrc: Oral Oral Oral   SpO2: 95% 99% 100% 100%  Weight:      Height:        General - Elderly African American ill male, no apparent distress. HEENT - PERRLA, EOMI, atraumatic head, non tender sinuses. Lung - Clear, basal rales, rhonchi, wheezes. Heart - S1, S2 heard, no murmurs, rubs. Abdomen - Soft, non tender, distended, bowel sounds good Neuro - Alert, awake and oriented, non focal exam. Skin - Warm and dry.  Data Reviewed:      Latest Ref Rng & Units 05/26/2024    2:51 AM 05/25/2024    5:32 AM 05/24/2024    3:04 AM  CBC  WBC 4.0 - 10.5 K/uL 5.9  4.8  6.3   Hemoglobin 13.0 - 17.0 g/dL 87.9  88.3  88.1   Hematocrit 39.0 - 52.0 % 35.3  36.1  36.4   Platelets 150 - 400 K/uL 207  254  263       Latest Ref Rng & Units 05/24/2024    3:04 AM 05/23/2024    3:13 AM 05/22/2024    4:23 AM  BMP  Glucose 70 - 99 mg/dL 896  87  721   BUN 8 - 23 mg/dL 41  33  37   Creatinine 0.61 - 1.24 mg/dL 8.85  8.97  9.01   Sodium 135 - 145 mmol/L 141  140  142   Potassium 3.5 - 5.1 mmol/L 4.9  4.6  4.6   Chloride 98 - 111 mmol/L 107  107  108   CO2 22 - 32 mmol/L 22  23  23    Calcium  8.9 - 10.3 mg/dL 9.4  9.1  9.1    No results found.  Disposition: Status is: Inpatient Remains inpatient appropriate because: placement, wound care.  Planned Discharge Destination: Skilled nursing facility     Time spent: 38 minutes  Author: Concepcion Riser, MD 05/26/2024 6:15 PM Secure chat 7am to 7pm For on call review [email or web address].

## 2024-05-26 NOTE — Plan of Care (Signed)
  Problem: Education: Goal: Knowledge of risk factors and measures for prevention of condition will improve Outcome: Progressing   Problem: Coping: Goal: Psychosocial and spiritual needs will be supported Outcome: Progressing   Problem: Nutritional: Goal: Maintenance of adequate nutrition will improve Outcome: Progressing

## 2024-05-27 DIAGNOSIS — A419 Sepsis, unspecified organism: Secondary | ICD-10-CM | POA: Diagnosis not present

## 2024-05-27 DIAGNOSIS — R6520 Severe sepsis without septic shock: Secondary | ICD-10-CM | POA: Diagnosis not present

## 2024-05-27 LAB — BASIC METABOLIC PANEL WITH GFR
Anion gap: 11 (ref 5–15)
BUN: 33 mg/dL — ABNORMAL HIGH (ref 8–23)
CO2: 22 mmol/L (ref 22–32)
Calcium: 9.1 mg/dL (ref 8.9–10.3)
Chloride: 108 mmol/L (ref 98–111)
Creatinine, Ser: 1.05 mg/dL (ref 0.61–1.24)
GFR, Estimated: >60 mL/min (ref >60)
Glucose, Bld: 59 mg/dL — ABNORMAL LOW (ref 70–99)
Potassium: 4.1 mmol/L (ref 3.5–5.1)
Sodium: 141 mmol/L (ref 135–145)

## 2024-05-27 LAB — CBC
HCT: 35.9 % — ABNORMAL LOW (ref 39.0–52.0)
Hemoglobin: 11.9 g/dL — ABNORMAL LOW (ref 13.0–17.0)
MCH: 29.2 pg (ref 26.0–34.0)
MCHC: 33.1 g/dL (ref 30.0–36.0)
MCV: 88.2 fL (ref 80.0–100.0)
Platelets: 230 K/uL (ref 150–400)
RBC: 4.07 MIL/uL — ABNORMAL LOW (ref 4.22–5.81)
RDW: 18.5 % — ABNORMAL HIGH (ref 11.5–15.5)
WBC: 4.8 K/uL (ref 4.0–10.5)
nRBC: 0 % (ref 0.0–0.2)

## 2024-05-27 LAB — GLUCOSE, CAPILLARY
Glucose-Capillary: 159 mg/dL — ABNORMAL HIGH (ref 70–99)
Glucose-Capillary: 169 mg/dL — ABNORMAL HIGH (ref 70–99)
Glucose-Capillary: 175 mg/dL — ABNORMAL HIGH (ref 70–99)
Glucose-Capillary: 191 mg/dL — ABNORMAL HIGH (ref 70–99)
Glucose-Capillary: 67 mg/dL — ABNORMAL LOW (ref 70–99)

## 2024-05-27 LAB — MAGNESIUM: Magnesium: 2 mg/dL (ref 1.7–2.4)

## 2024-05-27 LAB — POTASSIUM: Potassium: 4.3 mmol/L (ref 3.5–5.1)

## 2024-05-27 MED ORDER — DEXTROSE 250 MG/ML IV SOLN: 25.0000 mg | Freq: Once | INTRAVENOUS | Status: DC | PRN

## 2024-05-27 MED ORDER — INSULIN GLARGINE-YFGN 100 UNIT/ML ~~LOC~~ SOLN
6.0000 [IU] | Freq: Every day | SUBCUTANEOUS | Status: DC
  Administered 2024-05-29 – 2024-06-24 (×27): 6 [IU] via SUBCUTANEOUS
  Filled 2024-05-27 (×28): qty 0.06

## 2024-05-27 MED ORDER — INSULIN ASPART 100 UNIT/ML IJ SOLN
0.0000 [IU] | Freq: Three times a day (TID) | INTRAMUSCULAR | Status: DC
  Administered 2024-05-28: 3 [IU] via SUBCUTANEOUS
  Administered 2024-05-29 (×2): 2 [IU] via SUBCUTANEOUS
  Administered 2024-05-30 – 2024-06-01 (×3): 1 [IU] via SUBCUTANEOUS
  Administered 2024-06-02 – 2024-06-05 (×2): 2 [IU] via SUBCUTANEOUS
  Administered 2024-06-06: 4 [IU] via SUBCUTANEOUS
  Administered 2024-06-07 – 2024-06-09 (×6): 1 [IU] via SUBCUTANEOUS

## 2024-05-27 NOTE — Consult Note (Signed)
 [facility] Consult follow-up  Patient Name: .Paxon Propes  MRN: [id number]  DOB: [birth year]  Consult Order details:  Orders (From admission, onward)     Start     Ordered   05/22/24 1606  IP CONSULT TO PSYCHIATRY       Ordering Provider: Darci Pore, MD  Provider:  (Not yet assigned)  Question Answer Comment  Location [facility]   Reason for Consult? depression      05/22/24 1605             Mode of Visit: In person    Psychiatry Consult Evaluation  Service Date: May 27, 2024 LOS:  LOS: 84 days  Chief Complaint: depression  Primary Psychiatric Diagnoses  Adjustment disorder with anxious features  Assessment  Jamaar Howes is a 83 y.o. male admitted Medically at 03/04/2024  4:41 PM for management of A-fib with RVR and severe sepsis secondary to decubitus sacral ulcers stage IV. He has no prior psychiatric history and has a past medical history of stroke, insulin -dependent T2DM, sacral pressure ulcer, failure to thrive.   His current presentation of decreased appetite, increased restlessness, and increased anxiety in the setting of recent medical illness and prolonged hospitalization is most consistent with adjustment disorder with anxious features. He does not have a history of taking psychotropic medications prior to hospitalization.   He has been started on Zoloft , which we will increase for the above symptoms. We will check in with the patient on 7/7 to ensure tolerability and likely sign off at that point. The patient's daughter was contacted, and we had a long discussion about the patient's current symptoms, medical difficulties, and our recommendation for the medication change.  All questions were answered.  She is his primary social support and she was given information for outpatient psychiatry follow-up.  Update from 7/7 evaluation: Patient interviewed at bedside. He is calm, cooperative, and alert. He is tolerating  recent Zoloft  increase from 25mg  to 50mg  well and does not endorse any side effects. Patient is encouraged to follow up with outpatient psychiatry if desired for further medication management. We will sign off at this time.  Diagnoses:  Active Hospital problems: Principal Problem:   Severe sepsis (HCC) Active Problems:   Paroxysmal atrial fibrillation (HCC)   MGUS (monoclonal gammopathy of unknown significance)   Essential hypertension   Hyperlipidemia   Insulin  dependent type 2 diabetes mellitus (HCC)   History of CVA (cerebrovascular accident)   Cardiac amyloidosis (HCC)   Chronic systolic CHF (congestive heart failure) (HCC)   Sepsis (HCC)   Atrial flutter (HCC)   GERD (gastroesophageal reflux disease)   Sacral ulcer (HCC)   History of hypertension   Atrial fibrillation with RVR (HCC)   Community acquired pneumonia   Protein-calorie malnutrition, severe   Wild-type transthyretin-related (ATTR) amyloidosis (HCC)    Plan   ## Psychiatric Medication Recommendations:  -Adjustment disorder with anxious features  - Continue sertraline  50 mg daily for anxiety and depression  - Continue hydroxyzine  10mg  TID PRN   ## Medical Decision Making Capacity: Not specifically addressed in this encounter  ## Further Work-up:  -- Per primary team -- Most recent EKG on 6/17 had QtC of 401 -- Pertinent labwork reviewed earlier this admission includes: CMP, CBC, Mag, glucose, U/A   ## Disposition:-- There are no psychiatric contraindications to discharge at this time. Daughter provided with phone number for [facility].  ## Behavioral / Environmental: - No specific recommendations at this time.     ##  Safety and Observation Level:  - Based on my clinical evaluation, I estimate the patient to be at low risk of self harm in the current setting. - At this time, we recommend  routine. This decision is based on my review of the chart including patient's history  and current presentation, interview of the patient, mental status examination, and consideration of suicide risk including evaluating suicidal ideation, plan, intent, suicidal or self-harm behaviors, risk factors, and protective factors. This judgment is based on our ability to directly address suicide risk, implement suicide prevention strategies, and develop a safety plan while the patient is in the clinical setting. Please contact our team if there is a concern that risk level has changed.  CSSR Risk Category:C-SSRS RISK CATEGORY: No Risk  Suicide Risk Assessment: Patient has following modifiable risk factors for suicide: pain, medical illness (ie new dx of cancer), which we are addressing by psychotropic medications. Patient has following non-modifiable or demographic risk factors for suicide: male gender Patient has the following protective factors against suicide: Access to outpatient mental health care, Supportive family, and no history of suicide attempts  Thank you for this consult request. Recommendations have been communicated to the primary team.  We will sign off at this time.   Karleen Kaufmann, MD PGY-4         History of Present Illness  Relevant Aspects of [facility] Course:  Admitted on 03/04/2024 for A-fib with RVR and severe sepsis secondary to decubitus sacral ulcers stage IV.  Patient Report:  On initial examination, patient is alert and oriented to person, place, time. Throughout interview, speech is garbled though generally understandable.  The patient denies experiencing depression or anxiety.  Pt denies SI, sleep dysregulation.  His daughter may be realizing more symptoms of depression and anxiety.  Per daughter, patient has been eating less since being hospitalized, appears more anxious than baseline. She notes increased WOB that she believes may be related to anxiety. Please see plan above for detailed recommendations.   Psych ROS:  Depression: Yes, as  described below, though the patient denies depressed mood Anxiety:  Yes Mania (lifetime and current): No Psychosis: (lifetime and current): No  Collateral information:  Contacted Para Revard at [phone] on 7/3:  In addition to the information described above, we also discussed acute safety concerns with the patient's daughter.  She reports the patient has made no statements about wanting to end his life or not being alive.  It does not appear the patient has previously had self-harm behavior or suicide attempts.  She denies having any present safety concerns regarding the patient.   Psychiatric and Social History  Psychiatric History:  Information collected from chart review Prev Dx/Sx: No pertinent history Current Psych Provider: None Home Meds (current): None Previous Med Trials: None Therapy: None  Prior Psych Hospitalization: No  Prior Self Harm: No Prior Violence: No  Family Psych History: None pertinent  Social History:  Was living at [facility] rehab before hospitalization.  Strong social support from his daughter.  Substance History None, has been hospitalized for about 2 months.  Exam Findings  Vital Signs:  Temp:  [97.3 F (36.3 C)-97.7 F (36.5 C)] 97.4 F (36.3 C) (07/07 0533) Pulse Rate:  [66-79] 73 (07/07 0745) Resp:  [17-18] 18 (07/07 0745) BP: (112-133)/(63-83) 124/63 (07/07 0745) SpO2:  [99 %-100 %] 100 % (07/07 0745) Blood pressure 124/63, pulse 73, temperature (!) 97.4 F (36.3 C), resp. rate 18, height 5' 11 (1.803 m), weight 72.6 kg, SpO2  100%. Body mass index is 22.32 kg/m.    Psychiatric Specialty Exam: Physical Exam Constitutional:      Appearance: the patient is not toxic-appearing.  Pulmonary:     Effort: Pulmonary effort is normal.  Neurological:     General: No focal deficit present.     Mental Status: the patient is alert and oriented to person, place, and time.   Review of Systems  Respiratory: Shortness of  breath Cardiovascular:  Negative for chest pain.  Gastrointestinal:  Negative for abdominal pain, constipation, diarrhea, nausea and vomiting.  Neurological:  Negative for headaches.      BP 124/63 (BP Location: Left Wrist)   Pulse 73   Temp (!) 97.4 F (36.3 C)   Resp 18   Ht 5' 11 (1.803 m)   Wt 72.6 kg   SpO2 100%   BMI 22.32 kg/m   General Appearance: Fairly Groomed, breathing appears labored  Eye Contact:  Good  Speech: Overall coherent, difficult to understand at times  Volume:  Normal  Mood:  Euthymic  Affect:  Congruent, though difficult to assess based on medical illness  Thought Process:  Coherent  Orientation:  Full (Time, Place, and Person)  Thought Content: Logical   Suicidal Thoughts:  No  Homicidal Thoughts:  No  Memory:  Immediate;   Good  Judgement:  fair  Insight: Poor, does not feel he has any depression or anxiety  Psychomotor Activity:  Normal  Concentration:  Concentration: Good  Recall:  Good  Fund of Knowledge: Good  Language: Good  Akathisia:  No  Handed:  not assessed  AIMS (if indicated): not done  Assets:  Communication Skills Desire for Improvement Financial Resources/Insurance Housing Leisure Time Physical Health  ADL's:  Intact  Cognition: WNL       Other History   These have been pulled in through the EMR, reviewed, and updated if appropriate.  Family History:  The patient's family history includes Colon cancer in his father; Diabetes in his father; Sickle cell anemia in his mother; Stomach cancer in his father.  Medical History: Past Medical History:  Diagnosis Date   Adenomatous colon polyp    Allergy    Anal fissure    Arthritis    BPH (benign prostatic hyperplasia)    Diabetes mellitus (HCC)    GERD (gastroesophageal reflux disease)    Hyperlipidemia    Hypertension    Hypothyroidism    Other testicular hypofunction    Stroke [facility])     Surgical History: Past Surgical History:  Procedure Laterality Date    CIRCUMCISION N/A 06/11/2019   Procedure: CIRCUMCISION ADULT;  Surgeon: Nieves Cough, MD;  Location: [facility] ORS;  Service: Urology;  Laterality: N/A;   COLONOSCOPY     INGUINAL HERNIA REPAIR Bilateral    1968 and 2004   IR THORACENTESIS ASP PLEURAL SPACE W/IMG GUIDE  05/08/2024   LOOP RECORDER INSERTION N/A 07/28/2017   Procedure: LOOP RECORDER INSERTION;  Surgeon: Fernande Elspeth BROCKS, MD;  Location: [facility] INVASIVE CV LAB;  Service: Cardiovascular;  Laterality: N/A;   RIGHT/LEFT HEART CATH AND CORONARY ANGIOGRAPHY N/A 10/07/2021   Procedure: RIGHT/LEFT HEART CATH AND CORONARY ANGIOGRAPHY;  Surgeon: Rolan Ezra RAMAN, MD;  Location: [facility] INVASIVE CV LAB;  Service: Cardiovascular;  Laterality: N/A;   TEE WITHOUT CARDIOVERSION N/A 07/28/2017   Procedure: TRANSESOPHAGEAL ECHOCARDIOGRAM (TEE);  Surgeon: Shlomo Wilbert SAUNDERS, MD;  Location: [facility] ENDOSCOPY;  Service: Cardiovascular;  Laterality: N/A;     Medications:   Current Facility-Administered Medications:  acetaminophen  (TYLENOL ) tablet 650 mg, 650 mg, Oral, Q6H PRN, 650 mg at 05/23/24 1539 **OR** acetaminophen  (TYLENOL ) suppository 650 mg, 650 mg, Rectal, Q6H PRN, Sundil, Subrina, MD   [COMPLETED] amiodarone  (PACERONE ) tablet 200 mg, 200 mg, Oral, BID, 200 mg at 03/16/24 0849 **FOLLOWED BY** amiodarone  (PACERONE ) tablet 200 mg, 200 mg, Oral, Daily, O'Neal, Darryle Ned, MD, 200 mg at 05/26/24 0751   apixaban  (ELIQUIS ) tablet 5 mg, 5 mg, Oral, BID, Adhikari, Amrit, MD, 5 mg at 05/26/24 2142   ascorbic acid  (VITAMIN C ) tablet 500 mg, 500 mg, Oral, BID, Pahwani, Ravi, MD, 500 mg at 05/26/24 2142   atorvastatin  (LIPITOR) tablet 40 mg, 40 mg, Oral, Daily, Sundil, Subrina, MD, 40 mg at 05/26/24 9248   Chlorhexidine  Gluconate Cloth 2 % PADS 6 each, 6 each, Topical, Daily, Jillian Buttery, MD, 6 each at 05/26/24 1045   collagenase  (SANTYL ) ointment, , Topical, Daily, Amin, Sumayya, MD, Given at 05/26/24 9247   dextrose  25% IV injection, 25 mg, Intravenous, Once PRN,  Ghimire, Kuber, MD   diclofenac  Sodium (VOLTAREN ) 1 % topical gel 4 g, 4 g, Topical, QID, Akula, Vijaya, MD, 4 g at 05/26/24 2145   feeding supplement (ENSURE ENLIVE / ENSURE PLUS) liquid 237 mL, 237 mL, Oral, BID BM, Pahwani, Ravi, MD, 237 mL at 05/26/24 1323   ferrous sulfate  tablet 325 mg, 325 mg, Oral, QHS, Sundil, Subrina, MD, 325 mg at 05/26/24 2143   food thickener (SIMPLYTHICK (HONEY/LEVEL 3/MODERATELY THICK)) 10 packet, 10 packet, Oral, PRN, Akula, Vijaya, MD   furosemide  (LASIX ) injection 20 mg, 20 mg, Intravenous, Daily **AND** potassium chloride  SA (KLOR-CON  M) CR tablet 20 mEq, 20 mEq, Oral, Daily, Sreeram, Narendranath, MD   guaiFENesin  (MUCINEX ) 12 hr tablet 600 mg, 600 mg, Oral, BID, Adhikari, Amrit, MD, 600 mg at 05/26/24 2142   hydrOXYzine  (ATARAX ) tablet 10 mg, 10 mg, Oral, TID PRN, Cooper, Josseline P, PA-C, 10 mg at 05/26/24 2142   insulin  aspart (novoLOG ) injection 0-6 Units, 0-6 Units, Subcutaneous, TID WC, Ghimire, Kuber, MD   [START ON 05/28/2024] insulin  glargine-yfgn (SEMGLEE ) injection 6 Units, 6 Units, Subcutaneous, Daily, Ghimire, Kuber, MD   levothyroxine  (SYNTHROID ) tablet 50 mcg, 50 mcg, Oral, Q0600, Sundil, Subrina, MD, 50 mcg at 05/27/24 9346   lip balm (CARMEX) ointment 1 Application, 1 Application, Topical, PRN, Jillian Buttery, MD   liver oil-zinc  oxide (DESITIN) 40 % ointment, , Topical, BID, Sundil, Subrina, MD, Given at 05/26/24 2145   melatonin tablet 3 mg, 3 mg, Oral, Once PRN, Rathore, Vasundhra, MD   multivitamin with minerals tablet 1 tablet, 1 tablet, Oral, Daily, Odell Celinda Balo, MD, 1 tablet at 05/26/24 9246   nutrition supplement (JUVEN) (JUVEN) powder packet 1 packet, 1 packet, Oral, BID BM, Jillian Buttery, MD, 1 packet at 05/26/24 1316   ondansetron  (ZOFRAN ) tablet 4 mg, 4 mg, Oral, Q6H PRN **OR** ondansetron  (ZOFRAN ) injection 4 mg, 4 mg, Intravenous, Q6H PRN, Sundil, Subrina, MD   Oral care mouth rinse, 15 mL, Mouth Rinse, 4 times per day,  Perri DELENA Meliton Mickey., MD, 15 mL at 05/26/24 2143   Oral care mouth rinse, 15 mL, Mouth Rinse, PRN, Perri DELENA Meliton Mickey., MD   Oral care mouth rinse, 15 mL, Mouth Rinse, 4 times per day, Briana Elgin DELENA, MD, 15 mL at 05/26/24 2143   Oral care mouth rinse, 15 mL, Mouth Rinse, PRN, Briana Elgin DELENA, MD   oxyCODONE  (Oxy IR/ROXICODONE ) immediate release tablet 2.5 mg, 2.5 mg, Oral, Q4H PRN, Cooper, Josseline P, PA-C,  2.5 mg at 05/25/24 1951   polyethylene glycol (MIRALAX  / GLYCOLAX ) packet 17 g, 17 g, Oral, Daily PRN, Sebastian Toribio GAILS, MD, 17 g at 05/23/24 1651   polyethylene glycol (MIRALAX  / GLYCOLAX ) packet 17 g, 17 g, Oral, BID, Perri DELENA Meliton Mickey., MD, 17 g at 05/24/24 2230   polyvinyl alcohol  (LIQUIFILM TEARS) 1.4 % ophthalmic solution 1 drop, 1 drop, Both Eyes, PRN, Odell Celinda Balo, MD, 1 drop at 05/02/24 1725   senna-docusate (Senokot-S) tablet 1 tablet, 1 tablet, Oral, QHS, Akula, Vijaya, MD, 1 tablet at 05/26/24 2142   sertraline  (ZOLOFT ) tablet 50 mg, 50 mg, Oral, Daily, Stevens, Briana N, MD, 50 mg at 05/26/24 0751   sodium chloride  flush (NS) 0.9 % injection 10-40 mL, 10-40 mL, Intracatheter, Q12H, Sreeram, Narendranath, MD, 10 mL at 05/26/24 2144   sodium hypochlorite (DAKIN'S 1/4 STRENGTH) topical solution, , Irrigation, Daily, Darci Pore, MD, Given at 05/26/24 1017   Tafamidis  CAPS 61 mg, 1 capsule, Oral, Daily, Adhikari, Amrit, MD, 61 mg at 05/26/24 1017   vitamin A  capsule 10,000 Units, 10,000 Units, Oral, Daily, Pahwani, Ravi, MD, 10,000 Units at 05/26/24 0750   vutrisiran  (Amvuttra ) 25 mg/0.5 ml syringe - patient's own supply, 25 mg, Subcutaneous, Q90 days, Darci Pore, MD, 25 mg at 04/29/24 2339   zinc  sulfate (50mg  elemental zinc ) capsule 220 mg, 220 mg, Oral, Daily, Pahwani, Ravi, MD, 220 mg at 05/26/24 1017  Allergies: Allergies  Allergen Reactions   Penicillins Other (See Comments)    Did it involve swelling of the face/tongue/throat, SOB, or  low BP? Unknown Did it involve sudden or severe rash/hives, skin peeling, or any reaction on the inside of your mouth or nose? Unknown Did you need to seek medical attention at a hospital or doctor's office? No When did it last happen?    Over 55 Years Ago   If all above answers are NO, may proceed with cephalosporin use.     Karleen Kaufmann, MD PGY-4

## 2024-05-27 NOTE — Plan of Care (Signed)
   Problem: Fluid Volume: Goal: Hemodynamic stability will improve Outcome: Progressing

## 2024-05-27 NOTE — Progress Notes (Signed)
 PROGRESS NOTE    Earl [name]  FMW:[id number] DOB: 31-Jul-1941 DOA: 03/04/2024 PCP: [name], MD    Brief Narrative:  83 year old male with history of PAF on Eliquis , IDDM 2, HTN, CVA with right-sided residual weakness, MGUS, BPH, systolic CHF who is here with sacral pain. Of note, he was hospitalized 3/26-4/10 with generalized weakness/acute encephalopathy, hypotension, and was COVID PCR positive. He was eventually discharged to SNF and readmitted 4 days later with pain in the sacral area and shortness of breath. He was found to be in A-fib with RVR, tachypneic and was hypotensive on admission for which critical care was consulted. General surgery was consulted for sacral wound debridement status post bedside excision 4/15 and 03/07/2024.    Patient is here almost 3 months now.  Looks like family has unrealistic expectations. With his profound debility, wound healing will be challenging.  He also has poor appetite.  Overall poor prognosis.  Will be appropriate to take to nursing home to provide supportive care.  Subjective: Patient seen in the morning rounds.  No family at the bedside.  Patient tells me that he does not have any appetite so he does not like to eat.  He has some mild pain on his feet and buttocks.  Denies any other complaints.  Blood sugars were low.  Insulin  doses were decreased.   Assessment & Plan:   Sacral decubitus ulcer, stage IV Infected ulcer Pressure injuries present on admission.  Severe sepsis secondary to pressure ulcers. Concern for infection on admission. Patient was treated with broad-spectrum antibiotics.  Seen by infectious disease.  Completed antibiotic therapy.  Sepsis physiology resolved.   CT on 6/16 concern for possible osteomyelitis. Surgery consulted and performed wound debridement during this hospitalization.  Plastic surgery was also consulted with recommendation for continued wound care and no surgical management. Continue current  wound care, continue pressure relieving therapies. He is very weak to work with PT/ PTT.  Psychiatry advised Zoloft  for depression-dose increased and patient is tolerating.   Acute on chronic pain Continue oxycodone  as needed Continue Tylenol  as needed. LFT stable.   Paroxysmal atrial fibrillation Continue amiodarone , Eliquis .  Remains in sinus rhythm now.   Acute on chronic combined heart failure Anasarca Abdominal distention, thigh swelling better. Continue Tafamidis  and vutrisiran . On low-dose Lasix  IV.  Will change to oral tomorrow.   Left pleural effusion Patient underwent left-sided thoracentesis on 6/18 yielded 600 mL of hazy amber fluid.  Body fluid culture with no growth.   Anemia of chronic disease Stable. Hb above 11.   Elevated LFTs No liver pathology seen on CT imaging.  LFT high possibly due to heart failure.   Hypernatremia Na improved and stable.    Dysphagia Speech therapy on board. Diet recommendations: Thin liquid; Dysphagia 1 (puree)   Severe protein malnutrition Continue dietician recommendations, encourage diet, supplements.   Diabetes mellitus type 2, now with hypoglycemia. hemoglobin A1C of 8.4%.  Continue Semglee  and SSI.  Decrease dose of insulin .   DVT prophylaxis: SCDs Start: 03/04/24 2151 Place TED hose Start: 03/04/24 2151 apixaban  (ELIQUIS ) tablet 5 mg   Code Status: DNR limited intervention Family Communication: None at the bedside Disposition Plan: Status is: Inpatient Remains inpatient appropriate because: Complex wound care     Consultants:  Surgery Psychiatry Plastic surgery  Procedures:  Wound debridement  Antimicrobials:  Completed     Objective: Vitals:   05/26/24 2030 05/27/24 0014 05/27/24 0533 05/27/24 0745  BP: 122/83 117/66 123/72 124/63  Pulse: 79 66 66  73  Resp: 18 17 17 18   Temp: (!) 97.4 F (36.3 C) 97.7 F (36.5 C) (!) 97.4 F (36.3 C)   TempSrc:      SpO2: 100% 99% 100% 100%  Weight:       Height:        Intake/Output Summary (Last 24 hours) at 05/27/2024 1244 Last data filed at 05/27/2024 0906 Gross per 24 hour  Intake 200 ml  Output 475 ml  Net -275 ml   Filed Weights   05/23/24 0500 05/24/24 0448 05/25/24 0500  Weight: 77.1 kg 77.1 kg 72.6 kg    Examination:  General: Chronically sick looking.  Not in any distress.  Frail and debilitated. Cardiovascular: S1-S2 normal.  Regular rate rhythm. Respiratory: Bilateral clear.  No added sounds. Gastrointestinal: Soft and nontender, bowel sound present. Ext: Alert awake.  Mostly oriented.  Gross generalized weakness. Flat affect.  Depressed mood. Pressure Injury 02/26/24 Coccyx Mid Stage 4 - Full thickness tissue loss with exposed bone, tendon or muscle. (Active)  02/26/24 0916  Location: Coccyx  Location Orientation: Mid  Staging: Stage 4 - Full thickness tissue loss with exposed bone, tendon or muscle.  Wound Description (Comments):   DO NOT USE:  Present on Admission: Yes     Pressure Injury 02/26/24 Toe (Comment  which one) Anterior;Right Deep Tissue Pressure Injury - Purple or maroon localized area of discolored intact skin or blood-filled blister due to damage of underlying soft tissue from pressure and/or shear. right gre (Active)  02/26/24 1215  Location: Toe (Comment  which one)  Location Orientation: Anterior;Right  Staging: Deep Tissue Pressure Injury - Purple or maroon localized area of discolored intact skin or blood-filled blister due to damage of underlying soft tissue from pressure and/or shear.  Wound Description (Comments): right great toe  DO NOT USE:  Present on Admission:      Pressure Injury 03/06/24 Hip Anterior;Left;Proximal Unstageable - Full thickness tissue loss in which the base of the injury is covered by slough (yellow, tan, gray, green or brown) and/or eschar (tan, brown or black) in the wound bed. unstageable (Active)  03/06/24 1612  Location: Hip  Location Orientation:  Anterior;Left;Proximal  Staging: Unstageable - Full thickness tissue loss in which the base of the injury is covered by slough (yellow, tan, gray, green or brown) and/or eschar (tan, brown or black) in the wound bed.  Wound Description (Comments): unstageable  DO NOT USE:  Present on Admission: No     Pressure Injury 03/06/24 Shoulder Left Unstageable - Full thickness tissue loss in which the base of the injury is covered by slough (yellow, tan, gray, green or brown) and/or eschar (tan, brown or black) in the wound bed. unstageable (Active)  03/06/24 1613  Location: Shoulder  Location Orientation: Left  Staging: Unstageable - Full thickness tissue loss in which the base of the injury is covered by slough (yellow, tan, gray, green or brown) and/or eschar (tan, brown or black) in the wound bed.  Wound Description (Comments): unstageable  DO NOT USE:  Present on Admission: No     Pressure Injury 04/23/24 Heel Right Unstageable - Full thickness tissue loss in which the base of the injury is covered by slough (yellow, tan, gray, green or brown) and/or eschar (tan, brown or black) in the wound bed. DTPI evolving into unstageable (Active)  04/23/24   Location: Heel  Location Orientation: Right  Staging: Unstageable - Full thickness tissue loss in which the base of the injury is covered by  slough (yellow, tan, gray, green or brown) and/or eschar (tan, brown or black) in the wound bed.  Wound Description (Comments): DTPI evolving into unstageable  DO NOT USE:  Present on Admission: No     Pressure Injury 04/23/24 Heel Left Deep Tissue Pressure Injury - Purple or maroon localized area of discolored intact skin or blood-filled blister due to damage of underlying soft tissue from pressure and/or shear. (Active)  04/23/24   Location: Heel  Location Orientation: Left  Staging: Deep Tissue Pressure Injury - Purple or maroon localized area of discolored intact skin or blood-filled blister due to damage of  underlying soft tissue from pressure and/or shear.  Wound Description (Comments):   DO NOT USE:  Present on Admission: No         Data Reviewed: I have personally reviewed following labs and imaging studies  CBC: Recent Labs  Lab 05/24/24 0304 05/25/24 0532 05/26/24 0251 05/27/24 0557  WBC 6.3 4.8 5.9 4.8  HGB 11.8* 11.6* 12.0* 11.9*  HCT 36.4* 36.1* 35.3* 35.9*  MCV 89.7 90.0 88.5 88.2  PLT 263 254 207 230   Basic Metabolic Panel: Recent Labs  Lab 05/22/24 0423 05/23/24 0313 05/24/24 0304 05/27/24 0557  NA 142 140 141 141  K 4.6 4.6 4.9 4.1  CL 108 107 107 108  CO2 23 23 22 22   GLUCOSE 278* 87 103* 59*  BUN 37* 33* 41* 33*  CREATININE 0.98 1.02 1.14 1.05  CALCIUM  9.1 9.1 9.4 9.1   GFR: Estimated Creatinine Clearance: 54.7 mL/min (by C-G formula based on SCr of 1.05 mg/dL). Liver Function Tests: Recent Labs  Lab 05/22/24 0423 05/23/24 0313 05/24/24 0304  AST 61* 49* 51*  ALT 71* 62* 64*  ALKPHOS 132* 133* 142*  BILITOT 1.1 1.3* 1.1  PROT 6.0* 5.9* 5.9*  ALBUMIN  2.9* 3.0* 3.0*   No results for input(s): LIPASE, AMYLASE in the last 168 hours. No results for input(s): AMMONIA in the last 168 hours. Coagulation Profile: No results for input(s): INR, PROTIME in the last 168 hours. Cardiac Enzymes: No results for input(s): CKTOTAL, CKMB, CKMBINDEX, TROPONINI in the last 168 hours. BNP (last 3 results) No results for input(s): PROBNP in the last 8760 hours. HbA1C: No results for input(s): HGBA1C in the last 72 hours. CBG: Recent Labs  Lab 05/26/24 0728 05/26/24 1126 05/26/24 1637 05/26/24 2031 05/27/24 0743  GLUCAP 81 156* 173* 138* 67*   Lipid Profile: No results for input(s): CHOL, HDL, LDLCALC, TRIG, CHOLHDL, LDLDIRECT in the last 72 hours. Thyroid  Function Tests: No results for input(s): TSH, T4TOTAL, FREET4, T3FREE, THYROIDAB in the last 72 hours. Anemia Panel: No results for input(s):  VITAMINB12, FOLATE, FERRITIN, TIBC, IRON, RETICCTPCT in the last 72 hours. Sepsis Labs: No results for input(s): PROCALCITON, LATICACIDVEN in the last 168 hours.  No results found for this or any previous visit (from the past 240 hours).       Radiology Studies: No results found.      Scheduled Meds:  amiodarone   200 mg Oral Daily   apixaban   5 mg Oral BID   ascorbic acid   500 mg Oral BID   atorvastatin   40 mg Oral Daily   Chlorhexidine  Gluconate Cloth  6 each Topical Daily   collagenase    Topical Daily   diclofenac  Sodium  4 g Topical QID   feeding supplement  237 mL Oral BID BM   ferrous sulfate   325 mg Oral QHS   furosemide   20 mg Intravenous Daily  And   potassium chloride   20 mEq Oral Daily   guaiFENesin   600 mg Oral BID   insulin  aspart  0-6 Units Subcutaneous TID WC   [START ON 05/28/2024] insulin  glargine-yfgn  6 Units Subcutaneous Daily   levothyroxine   50 mcg Oral Q0600   liver oil-zinc  oxide   Topical BID   multivitamin with minerals  1 tablet Oral Daily   nutrition supplement (JUVEN)  1 packet Oral BID BM   mouth rinse  15 mL Mouth Rinse 4 times per day   mouth rinse  15 mL Mouth Rinse 4 times per day   polyethylene glycol  17 g Oral BID   senna-docusate  1 tablet Oral QHS   sertraline   50 mg Oral Daily   sodium chloride  flush  10-40 mL Intracatheter Q12H   Tafamidis   1 capsule Oral Daily   vitamin A   10,000 Units Oral Daily   vutrisiran  sodium  25 mg Subcutaneous Q90 days   zinc  sulfate (50mg  elemental zinc )  220 mg Oral Daily   Continuous Infusions:  dextrose        LOS: 84 days    Time spent: 45 minutes    Renato Applebaum, MD [facility]

## 2024-05-27 NOTE — Progress Notes (Signed)
 Nutrition Follow-up  DOCUMENTATION CODES:   Severe malnutrition in context of chronic illness  INTERVENTION:   Continue to encourage optimal nutritional intake Continue Magic cup TID with meals, each supplement provides 290 kcal and 9 grams of protein Continue Ensure Enlive po BID-One in the Afternoon and one in the evening, each supplement provides 350 kcal and 20 grams of protein. Continue vanilla Greek yogurt to evening meal tray  Continue Juven BID to support wound healing Continue MVI with minerals daily 500 mg vitamin C  BID 220 mg zinc  sulfate daily x 14 days 10,000 units vitamin A  daily x 30 days for supplementation (7.6 (03/06/24)).  NUTRITION DIAGNOSIS:   Severe Malnutrition related to chronic illness as evidenced by energy intake < or equal to 75% for > or equal to 1 month, severe muscle depletion. - Still applicable   GOAL:   Patient will meet greater than or equal to 90% of their needs - Progressing   MONITOR:   PO intake, Supplement acceptance, Weight trends, Skin  REASON FOR ASSESSMENT:   Consult Assessment of nutrition requirement/status  ASSESSMENT:   83 year old male who presented to the ED on 03/04/24 from [facility] due to sacral pain. PMH of T2DM, HTN, HLD, CHF, atrial fibrillation, stroke with right-sided weakness, GERD, BPH, hypothyroidism, MGUS, transthyretin associated amyloid cardiomyopathy, chronic pancytopenia, recent hospital admission for COVID-19. Pt admitted with sepsis secondary to sacral pressure injury infection.  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 History obtained through chart review.    Pt with decreased PO intake within the last week. Pt was eating 75-100% of his meals and taking all supplements 1 week ago. Now pt averaging 0-50% of his meals. Still seems to be taking all his supplements. Pt told MD today he does not have any appetite and does not like to eat. Now hypoglycemic, MD decreasing insulin . If continued poor PO intake may recommend PEG.   Difficult disposition d/t family wanting inpatient care for pt's complex wounds.   Admit weight: 68.5 kg Current weight: 72.6 kg   Average Meal Intake: 7/3-7/7: 10-50% intake x 8 recorded meals  Nutritionally Relevant Medications: Scheduled Meds:  amiodarone   200 mg Oral Daily   apixaban   5 mg Oral BID   ascorbic acid   500 mg Oral BID   atorvastatin   40 mg Oral Daily   Chlorhexidine  Gluconate Cloth  6 each Topical Daily   collagenase    Topical Daily   diclofenac  Sodium  4 g Topical QID   feeding supplement  237 mL Oral BID BM   ferrous sulfate   325 mg Oral QHS   furosemide   20 mg Intravenous Daily   And   potassium chloride   20 mEq Oral Daily   guaiFENesin   600 mg Oral BID   insulin  aspart  0-6 Units Subcutaneous TID WC   [START ON 05/28/2024] insulin  glargine-yfgn  6 Units Subcutaneous Daily   levothyroxine   50 mcg Oral Q0600   liver oil-zinc  oxide   Topical BID   multivitamin with minerals  1 tablet Oral Daily   nutrition supplement (JUVEN)  1 packet Oral BID BM   mouth rinse  15 mL Mouth Rinse 4 times per day   mouth  rinse  15 mL Mouth Rinse 4 times per day   polyethylene glycol  17 g Oral BID   senna-docusate  1 tablet Oral QHS   sertraline   50 mg Oral Daily   sodium chloride  flush  10-40 mL Intracatheter Q12H   Tafamidis   1 capsule Oral Daily   vitamin A   10,000 Units Oral Daily   vutrisiran  sodium  25 mg Subcutaneous Q90 days   zinc  sulfate (50mg  elemental zinc )  220 mg Oral Daily   Continuous Infusions:  dextrose       Labs Reviewed: BUN 33 AP 142 Albumin  3 AST 51 ALT 64 Total protein  5.9 Prelbumin <5 Vitamin A  7.9 CBG ranges from 67-175 mg/dL over the last 24 hours HgbA1c 8.4   Diet Order:   Diet Order             DIET - DYS 1 Room service appropriate? No; Fluid consistency: Thin  Diet effective now                   EDUCATION NEEDS:   No education needs have been identified at this time  Skin:  Skin Assessment: Skin Integrity Issues: Skin Integrity Issues:: Unstageable, DTI, Stage IV DTI: lt heel Stage II: - Stage IV: sacrum Unstageable: lt hip, lt shoulder, rt heel, lt medial foot Other: -  Last BM:  05/26/2024 type 7  Height:   Ht Readings from Last 1 Encounters:  03/04/24 5' 11 (1.803 m)    Weight:   Wt Readings from Last 1 Encounters:  05/25/24 72.6 kg    BMI:  Body mass index is 22.32 kg/m.  Estimated Nutritional Needs:   Kcal:  2000-2200  Protein:  95-100 grams  Fluid:  >2.0 L   Olivia Kenning, RD Registered Dietitian  See Amion for more information

## 2024-05-28 DIAGNOSIS — A419 Sepsis, unspecified organism: Secondary | ICD-10-CM | POA: Diagnosis not present

## 2024-05-28 DIAGNOSIS — R6520 Severe sepsis without septic shock: Secondary | ICD-10-CM | POA: Diagnosis not present

## 2024-05-28 LAB — BASIC METABOLIC PANEL WITH GFR
Anion gap: 9 (ref 5–15)
BUN: 31 mg/dL — ABNORMAL HIGH (ref 8–23)
CO2: 22 mmol/L (ref 22–32)
Calcium: 8.5 mg/dL — ABNORMAL LOW (ref 8.9–10.3)
Chloride: 108 mmol/L (ref 98–111)
Creatinine, Ser: 1.02 mg/dL (ref 0.61–1.24)
GFR, Estimated: >60 mL/min (ref >60)
Glucose, Bld: 151 mg/dL — ABNORMAL HIGH (ref 70–99)
Potassium: 4.2 mmol/L (ref 3.5–5.1)
Sodium: 139 mmol/L (ref 135–145)

## 2024-05-28 LAB — CBC
HCT: 34.9 % — ABNORMAL LOW (ref 39.0–52.0)
Hemoglobin: 11.8 g/dL — ABNORMAL LOW (ref 13.0–17.0)
MCH: 29.5 pg (ref 26.0–34.0)
MCHC: 33.8 g/dL (ref 30.0–36.0)
MCV: 87.3 fL (ref 80.0–100.0)
Platelets: 232 K/uL (ref 150–400)
RBC: 4 MIL/uL — ABNORMAL LOW (ref 4.22–5.81)
RDW: 18.1 % — ABNORMAL HIGH (ref 11.5–15.5)
WBC: 4.9 K/uL (ref 4.0–10.5)
nRBC: 0 % (ref 0.0–0.2)

## 2024-05-28 LAB — GLUCOSE, CAPILLARY
Glucose-Capillary: 143 mg/dL — ABNORMAL HIGH (ref 70–99)
Glucose-Capillary: 199 mg/dL — ABNORMAL HIGH (ref 70–99)
Glucose-Capillary: 254 mg/dL — ABNORMAL HIGH (ref 70–99)
Glucose-Capillary: 203 mg/dL — ABNORMAL HIGH (ref 70–99)

## 2024-05-28 MED ORDER — POTASSIUM CHLORIDE CRYS ER 20 MEQ PO TBCR
20.0000 meq | EXTENDED_RELEASE_TABLET | Freq: Every day | ORAL | Status: DC
  Administered 2024-05-29 – 2024-07-02 (×37): 20 meq via ORAL
  Filled 2024-05-28 (×36): qty 1

## 2024-05-28 MED ORDER — FUROSEMIDE 20 MG PO TABS
20.0000 mg | ORAL_TABLET | Freq: Every day | ORAL | Status: DC
  Administered 2024-05-29 – 2024-06-01 (×4): 20 mg via ORAL
  Filled 2024-05-28 (×4): qty 1

## 2024-05-28 NOTE — Plan of Care (Signed)
   Problem: Fluid Volume: Goal: Hemodynamic stability will improve Outcome: Progressing

## 2024-05-28 NOTE — Progress Notes (Signed)
 Physical Therapy Treatment Patient Details Name: Earl [name] MRN: [id number] DOB: 1941/05/03 Today's Date: 05/28/2024   History of Present Illness 83 y.o. male admitted from [facility] rehab on 03/04/24 with SOB, sacral pain, Afib with RVR. Pt with acute metabolic encephalopathy, rhabdomyolysis and sepsis secondary to sacral wound infection. 4/15 & 4/17 bedside sacral wound debridement. PMhx: adm 3/26-4/08/2024 with fall, AMS, COVID; d/c to SNF rehab. CVA (2021, residual R-side weakness), T2DM, HTN, CHF, MGUS, Afib on Eliquis .    PT Comments  Patient progressing slowly.  Limited by pain with wounds after wound treatment today.  He seems motivated at times, but wanes with motivation when fatigued or in pain.  Daughter tends to encourage him more even when he is painful.  Patient able to self feed today with min A and mod cues while supported at EOB.  Goals updated though not progressed this session and one goal for bed to chair downgraded.  Feel he remains appropriate for post-acute inpatient rehab prior to d/c home.    If plan is discharge home, recommend the following: Two people to help with walking and/or transfers;Two people to help with bathing/dressing/bathroom;Assistance with feeding;Assist for transportation   Can travel by private vehicle     No  Equipment Recommendations  Hospital bed;Hoyer lift;Wheelchair cushion (measurements PT);Wheelchair (measurements PT) (air mattress, roho cushion)    Recommendations for Other Services       Precautions / Restrictions Precautions Precautions: Fall Recall of Precautions/Restrictions: Impaired Precaution/Restrictions Comments: sacral wound; L hip wound, bilat heel wounds     Mobility  Bed Mobility Overal bed mobility: Needs Assistance Bed Mobility: Rolling, Sidelying to Sit Rolling: Mod assist, +2 for safety/equipment, Used rails Sidelying to sit: Mod assist, +2 for safety/equipment, HOB elevated   Sit to supine: Max  assist, +2 for safety/equipment   General bed mobility comments: cues for technique, assist for rail use and lifting trunk; to supine assist for legs onto bed and to lower trunk; once supine rolling for positioning off sacral wound due to patient c/o pain    Transfers                   General transfer comment: deferred due to fatigue, focus on self feeding while on EOB    Ambulation/Gait                   Stairs             Wheelchair Mobility     Tilt Bed    Modified Rankin (Stroke Patients Only)       Balance Overall balance assessment: Needs assistance Sitting-balance support: Feet supported Sitting balance-Leahy Scale: Poor Sitting balance - Comments: mod to min a for balance while on EOB without hand support for self feeding                                    Communication Communication Communication: Impaired Factors Affecting Communication: Difficulty expressing self;Reduced clarity of speech  Cognition Arousal: Alert Behavior During Therapy: WFL for tasks assessed/performed   PT - Cognitive impairments: Problem solving, Safety/Judgement Difficult to assess due to: Impaired communication (soft high pitched voice)                     PT - Cognition Comments: remembers previous session today with hydro and wound care RN; slow processing, agrees to EOB though fatigued from prior treatments  today Following commands: Impaired Following commands impaired: Follows one step commands with increased time    Cueing Cueing Techniques: Verbal cues, Tactile cues  Exercises      General Comments General comments (skin integrity, edema, etc.): working on eating lunch as no one had come to help; pt needing help to secure bolus on spoon and for grasp with R hand at times using tubing for built up spoon; pt verbalizes well when finished though cues for swallow prior to next bolus and for liquids following puree      Pertinent  Vitals/Pain Pain Assessment Pain Assessment: Faces Faces Pain Scale: Hurts even more Pain Location: buttock wound Pain Descriptors / Indicators: Grimacing, Guarding, Discomfort Pain Intervention(s): Monitored during session, Repositioned, Limited activity within patient's tolerance    Home Living                          Prior Function            PT Goals (current goals can now be found in the care plan section) Acute Rehab PT Goals Patient Stated Goal: move better PT Goal Formulation: With patient/family Time For Goal Achievement: 06/11/24 Potential to Achieve Goals: Fair Progress towards PT goals: Progressing toward goals;Goals updated    Frequency    Min 1X/week      PT Plan      Co-evaluation              AM-PAC PT 6 Clicks Mobility   Outcome Measure  Help needed turning from your back to your side while in a flat bed without using bedrails?: Total Help needed moving from lying on your back to sitting on the side of a flat bed without using bedrails?: Total Help needed moving to and from a bed to a chair (including a wheelchair)?: Total Help needed standing up from a chair using your arms (e.g., wheelchair or bedside chair)?: Total Help needed to walk in hospital room?: Total Help needed climbing 3-5 steps with a railing? : Total 6 Click Score: 6    End of Session   Activity Tolerance: Patient limited by fatigue;Patient limited by pain Patient left: in bed;with call bell/phone within reach   PT Visit Diagnosis: Muscle weakness (generalized) (M62.81);Adult, failure to thrive (R62.7);Other abnormalities of gait and mobility (R26.89);Unsteadiness on feet (R26.81);Difficulty in walking, not elsewhere classified (R26.2)     Time: 1330-1400 PT Time Calculation (min) (ACUTE ONLY): 30 min  Charges:    $Therapeutic Activity: 8-22 mins $Self Care/Home Management: 8-22 PT General Charges $$ ACUTE PT VISIT: 1 Visit                     Micheline Portal, PT Acute Rehabilitation Services Office:[phone] 05/28/2024    Montie Portal 05/28/2024, 4:33 PM

## 2024-05-28 NOTE — Progress Notes (Signed)
 9549 All wounds cleaned & dressed as prescribed.

## 2024-05-28 NOTE — Consult Note (Signed)
 WOC Nurse wound follow up Wound type: Left hip: unstageable  Sacral: stage 4 L shoulder: unstageable R heel unstageable  L heel - DTPI  Measurement: Left hip:see PT wound notes Sacral: 5 cm x 3 cm x 0.5 cm with undermining from 12 o'clock to 3 o'clock measuring 2.5 cm  L shoulder: 2 cm x 3 cm R heel:3 cm x 3 cm L heel DTPI Wound bed/Drainage (amount, consistency, odor): Left hip:100% covered with thick yellow adherent slough, see PT wound care notes Sacrum: 90% clean red moist tissue in wound bed, 10% soft yellow slough at upper edge of wound, drainage is purulent, no odor L shoulder:90% wound bed covered with eschar with edges lifting. 10%  epithelialized pink tissue surrounding slough on all sides - no drainage or odor R heel: 100 % covered by dark brown eschar, wound feels boggy to touch, no drainage or odor L Heel: area of dark maroon purple discoloration, no drainage or odor Periwound: Intact Dressing procedure/placement/frequency: 1 - Left Hip PI unstageable Clean with NS, allow to air dry, apply Santyl  and cover with saline moistened gauze and then cover with foam dressing, change daily.   2 - Left shoulder DTPI  Paint with Betadine and leave open to air, daily.   3 - Sacral Unstageable PI Cleanse with Vashe#[id number]. Apply Aquacel Ag+ (silver hydrofiber) Lawson # [phone] in the wound bed and fill the undermining using a swab, cover with sacral foam dressing, change daily and PRN soilage   R heel:  Clean with Vashe ( lawson # [phone]), allow to air dry, apply santyl  to wound bed, cover with saline soaked gauze, and then cover with foam dressing, change daily   L heel:  Cover with foam dressing, change daily   Both feet should be placed in Prevalon boots to offload pressure Soila [phone])   Daughter is not present at the time of visit today, will outreach vis telephone to discuss changes in the plan of care.  Discussed with patient the need to continue to utilize Prevalon  boots to help offload pressure from heels, however patient reports has not been using, reports feels the Prevalon boots will make wounds worse.  Discussed importance of keeping pressure off heels to promote healing.  WOC Nurse team will follow with you and see patient within 10 days for wound assessments.  Please notify WOC nurses of any acute changes in the wounds or any new areas of concern.  Doyal Polite, RN, MSN, [facility] WOC Team

## 2024-05-28 NOTE — Progress Notes (Signed)
 Physical Therapy Wound Treatment Patient Details  Name: Earl [name] MRN: [id number] Date of Birth: 1941/02/02  Today's Date: 05/28/2024 Time: 8886-8841 Time Calculation (min): 45 min  Subjective  Subjective Assessment Subjective: WOC RN in room for assessment prior to PT wound session Patient and Family Stated Goals: Heal wounds Date of Onset:  (unknown) Prior Treatments: Dressing changes, off weighting  Pain Score:    Wound Assessment  Pressure Injury 03/06/24 Hip Anterior;Left;Proximal Unstageable - Full thickness tissue loss in which the base of the injury is covered by slough (yellow, tan, gray, green or brown) and/or eschar (tan, brown or black) in the wound bed. unstageable (Active)  Wound Image   05/28/24 1200  Dressing Type Gauze (Comment);Foam - Lift dressing to assess site every shift;Impregnated gauze (bismuth) 05/28/24 1500  Dressing Changed;Clean, Dry, Intact 05/28/24 1500  Dressing Change Frequency Daily 05/28/24 1500  State of Healing Non-healing 05/28/24 1500  Site / Wound Assessment Pink;Red;Yellow 05/28/24 1500  % Wound base Red or Granulating 5% 05/28/24 1500  % Wound base Yellow/Fibrinous Exudate 80% 05/28/24 1500  % Wound base Black/Eschar 0% 05/21/24 1100  % Wound base Other/Granulation Tissue (Comment) 15% 05/28/24 1500  Peri-wound Assessment Pink 05/28/24 0440  Wound Length (cm) 4.8 cm 05/28/24 1500  Wound Width (cm) 3.7 cm 05/28/24 1500  Wound Depth (cm) 2.2 cm 05/28/24 1500  Wound Surface Area (cm^2) 17.76 cm^2 05/28/24 1500  Wound Volume (cm^3) 39.07 cm^3 05/28/24 1500  Tunneling (cm) 0 05/17/24 1457  Undermining (cm) ~0.2cm 12:00-1:00 05/21/24 1100  Margins Unattached edges (unapproximated) 05/28/24 1500  Drainage Amount Minimal 05/28/24 1500  Drainage Description Serosanguineous 05/28/24 1500  Treatment Cleansed;Debridement (Selective) 05/28/24 1500   Selective Debridement (non-excisional) Selective Debridement (non-excisional) - Location: L  hip Selective Debridement (non-excisional) - Tools Used: Forceps, Scalpel Selective Debridement (non-excisional) - Tissue Removed: Slough non-viable adipose tissue    Wound Assessment and Plan  Wound Therapy - Assess/Plan/Recommendations Wound Therapy - Clinical Statement: Pt wound bed is covered in very spongy feeling necrosed adipose, the edge of the wound bed is covered by stringing yellow brown slough which is very friable and difficult to remove from wound bed. Pt  only tolerates minimal selective debridement. Given the large bioburden associated with necrosed tissue and slough,pt will continue to benefit from removal of tissue to decreased bioburden and promote wound bed healing. Wound Therapy - Functional Problem List: Decreased tolerance for position changes or OOB. Factors Delaying/Impairing Wound Healing: Diabetes Mellitus, Immobility, Infection - systemic/local, Multiple medical problems Hydrotherapy Plan: Debridement, Dressing change, Patient/family education Wound Therapy - Frequency: 2X / week Wound Therapy - Follow Up Recommendations: dressing changes by RN  Wound Therapy Goals- Improve the function of patient's integumentary system by progressing the wound(s) through the phases of wound healing (inflammation - proliferation - remodeling) by: Wound Therapy Goals - Improve the function of patient's integumentary system by progressing the wound(s) through the phases of wound healing by: Decrease Necrotic Tissue to: 20% Decrease Necrotic Tissue - Progress: Not progressing Increase Granulation Tissue to: 80% Increase Granulation Tissue - Progress: Not progressing Goals/treatment plan/discharge plan were made with and agreed upon by patient/family: Yes Time For Goal Achievement: 7 days Wound Therapy - Potential for Goals: Fair  Goals will be updated until maximal potential achieved or discharge criteria met.  Discharge criteria: when goals achieved, discharge from hospital, MD  decision/surgical intervention, no progress towards goals, refusal/missing three consecutive treatments without notification or medical reason.  GP     Charges PT Wound Care Charges $  Wound Debridement up to 20 cm: < or equal to 20 cm     [name] PT, DPT Acute Rehabilitation Services Please use secure chat or  Call Office [phone]   [name] [facility] 05/28/2024, 4:16 PM

## 2024-05-28 NOTE — Progress Notes (Signed)
 PROGRESS NOTE    Earl [name]  FMW:[id number] DOB: 1941/03/08 DOA: 03/04/2024 PCP: [name], MD    Brief Narrative:  83 year old male with history of PAF on Eliquis , IDDM 2, HTN, CVA with right-sided residual weakness, MGUS, BPH, systolic CHF amyloidosis who was admitted with sacral pain. Of note, he was hospitalized 3/26-4/10 with generalized weakness/acute encephalopathy, hypotension, and was COVID PCR positive. He was eventually discharged to SNF and readmitted 4 days later with pain in the sacral area and shortness of breath. He was found to be in A-fib with RVR, tachypneic and was hypotensive on admission for which critical care was consulted. General surgery was consulted for sacral wound debridement status post bedside excision 4/15 and 03/07/2024.    Patient is here almost 3 months now. With his profound debility, wound healing will be challenging.  He also has poor appetite.  He has fluctuating medical status.  Appetite remains challenging.  Wound care continues to follow.  Subjective: Patient seen and examined.  Today he was more alert and interactive with me.  He thinks he ate fairly well.  Discussed with him about therapist coming to work with him and he is not very happy about moving. No other overnight events.  Electrolytes are adequate. Urine output 750 mL noted over the last 24 hours. Net -17 L since admission. Will examine his wound tomorrow.   Assessment & Plan:   Sacral decubitus ulcer, stage IV Infected ulcer Pressure injuries present on admission.  Severe sepsis secondary to pressure ulcers. Concern for infection on admission. Patient was treated with broad-spectrum antibiotics.  Seen by infectious disease.  Completed antibiotic therapy.  Sepsis physiology resolved.   CT on 6/16 concern for possible osteomyelitis. Surgery consulted and performed wound debridement during this hospitalization.  Plastic surgery was also consulted with recommendation for  continued wound care and no surgical management. Continue current wound care, continue pressure relieving therapies. He is very weak to work with PT/ PTT.  Psychiatry advised Zoloft  for depression-dose increased and patient is tolerating.   Acute on chronic pain Continue oxycodone  as needed Continue Tylenol  as needed. LFT stable.   Paroxysmal atrial fibrillation Continue amiodarone , Eliquis .  Remains in sinus rhythm now.   Acute on chronic combined heart failure Anasarca Abdominal distention, thigh swelling has improved now. Continue Tafamidis  and vutrisiran . On low-dose Lasix  IV.  He received a dose of IV Lasix  today.  Will restart oral Lasix  tomorrow.   Left pleural effusion Patient underwent left-sided thoracentesis on 6/18 yielded 600 mL of hazy amber fluid.  Body fluid culture with no growth.   Anemia of chronic disease Stable. Hb above 11.   Dysphagia Speech therapy on board. Diet recommendations: Thin liquid; Dysphagia 1 (puree)   Severe protein malnutrition Continue dietician recommendations, encourage diet, supplements.   Diabetes mellitus type 2, now with hypoglycemia. hemoglobin A1C of 8.4%.  Continue Semglee  and SSI.  Decrease dose of insulin  with poor appetite and low blood sugars.   DVT prophylaxis: SCDs Start: 03/04/24 2151 Place TED hose Start: 03/04/24 2151 apixaban  (ELIQUIS ) tablet 5 mg   Code Status: DNR limited intervention Family Communication: Updated patient's daughter at the bedside 7/7.  Will recheck his wounds tomorrow. Disposition Plan: Status is: Inpatient Remains inpatient appropriate because: Complex wound care     Consultants:  Surgery Psychiatry Plastic surgery  Procedures:  Wound debridement  Antimicrobials:  Completed     Objective: Vitals:   05/28/24 0020 05/28/24 0408 05/28/24 0500 05/28/24 0720  BP: 119/70 110/62  122/68  Pulse: 73 68  68  Resp:    20  Temp: 98 F (36.7 C) 97.6 F (36.4 C)  (!) 97.5 F (36.4 C)   TempSrc: Oral Oral  Oral  SpO2: 99% 100%  98%  Weight:   72.6 kg   Height:        Intake/Output Summary (Last 24 hours) at 05/28/2024 1128 Last data filed at 05/28/2024 1104 Gross per 24 hour  Intake 640 ml  Output 950 ml  Net -310 ml   Filed Weights   05/24/24 0448 05/25/24 0500 05/28/24 0500  Weight: 77.1 kg 72.6 kg 72.6 kg    Examination:  General: Frail looking gentleman.  Not in any distress.  Flat affect.  Occasionally anxious. Cardiovascular: S1-S2 normal.  Regular rate rhythm. Respiratory: Bilateral clear.  No added sounds. Gastrointestinal: Soft and nontender, bowel sound present.  No evidence of fluid thrill.  No evidence of subcutaneous edema. Ext: Alert awake.  Mostly oriented.  Gross generalized weakness. Flat affect.  Depressed mood.    Data Reviewed: I have personally reviewed following labs and imaging studies  CBC: Recent Labs  Lab 05/24/24 0304 05/25/24 0532 05/26/24 0251 05/27/24 0557 05/28/24 0428  WBC 6.3 4.8 5.9 4.8 4.9  HGB 11.8* 11.6* 12.0* 11.9* 11.8*  HCT 36.4* 36.1* 35.3* 35.9* 34.9*  MCV 89.7 90.0 88.5 88.2 87.3  PLT 263 254 207 230 232   Basic Metabolic Panel: Recent Labs  Lab 05/22/24 0423 05/23/24 0313 05/24/24 0304 05/27/24 0557 05/27/24 2129 05/28/24 0428  NA 142 140 141 141  --  139  K 4.6 4.6 4.9 4.1 4.3 4.2  CL 108 107 107 108  --  108  CO2 23 23 22 22   --  22  GLUCOSE 278* 87 103* 59*  --  151*  BUN 37* 33* 41* 33*  --  31*  CREATININE 0.98 1.02 1.14 1.05  --  1.02  CALCIUM  9.1 9.1 9.4 9.1  --  8.5*  MG  --   --   --   --  2.0  --    GFR: Estimated Creatinine Clearance: 56.3 mL/min (by C-G formula based on SCr of 1.02 mg/dL). Liver Function Tests: Recent Labs  Lab 05/22/24 0423 05/23/24 0313 05/24/24 0304  AST 61* 49* 51*  ALT 71* 62* 64*  ALKPHOS 132* 133* 142*  BILITOT 1.1 1.3* 1.1  PROT 6.0* 5.9* 5.9*  ALBUMIN  2.9* 3.0* 3.0*   No results for input(s): LIPASE, AMYLASE in the last 168 hours. No  results for input(s): AMMONIA in the last 168 hours. Coagulation Profile: No results for input(s): INR, PROTIME in the last 168 hours. Cardiac Enzymes: No results for input(s): CKTOTAL, CKMB, CKMBINDEX, TROPONINI in the last 168 hours. BNP (last 3 results) No results for input(s): PROBNP in the last 8760 hours. HbA1C: No results for input(s): HGBA1C in the last 72 hours. CBG: Recent Labs  Lab 05/27/24 1303 05/27/24 1607 05/27/24 2027 05/27/24 2357 05/28/24 0759  GLUCAP 175* 169* 191* 159* 143*   Lipid Profile: No results for input(s): CHOL, HDL, LDLCALC, TRIG, CHOLHDL, LDLDIRECT in the last 72 hours. Thyroid  Function Tests: No results for input(s): TSH, T4TOTAL, FREET4, T3FREE, THYROIDAB in the last 72 hours. Anemia Panel: No results for input(s): VITAMINB12, FOLATE, FERRITIN, TIBC, IRON, RETICCTPCT in the last 72 hours. Sepsis Labs: No results for input(s): PROCALCITON, LATICACIDVEN in the last 168 hours.  No results found for this or any previous visit (from the past 240 hours).  Radiology Studies: No results found.      Scheduled Meds:  amiodarone   200 mg Oral Daily   apixaban   5 mg Oral BID   ascorbic acid   500 mg Oral BID   atorvastatin   40 mg Oral Daily   Chlorhexidine  Gluconate Cloth  6 each Topical Daily   collagenase    Topical Daily   diclofenac  Sodium  4 g Topical QID   feeding supplement  237 mL Oral BID BM   ferrous sulfate   325 mg Oral QHS   [START ON 05/29/2024] furosemide   20 mg Oral Daily   guaiFENesin   600 mg Oral BID   insulin  aspart  0-6 Units Subcutaneous TID WC   insulin  glargine-yfgn  6 Units Subcutaneous Daily   levothyroxine   50 mcg Oral Q0600   liver oil-zinc  oxide   Topical BID   multivitamin with minerals  1 tablet Oral Daily   nutrition supplement (JUVEN)  1 packet Oral BID BM   mouth rinse  15 mL Mouth Rinse 4 times per day   mouth rinse  15 mL Mouth Rinse 4 times per  day   polyethylene glycol  17 g Oral BID   [START ON 05/29/2024] potassium chloride   20 mEq Oral Daily   senna-docusate  1 tablet Oral QHS   sertraline   50 mg Oral Daily   sodium chloride  flush  10-40 mL Intracatheter Q12H   Tafamidis   1 capsule Oral Daily   vitamin A   10,000 Units Oral Daily   vutrisiran  sodium  25 mg Subcutaneous Q90 days   Continuous Infusions:  dextrose        LOS: 85 days    Time spent: 40 minutes    Renato Applebaum, MD [facility]

## 2024-05-28 NOTE — Plan of Care (Signed)
 Problem: Fluid Volume: Goal: Hemodynamic stability will improve 05/28/2024 0419 by Zyasia Halbleib, Earnie BIRCH, RN Outcome: Progressing 05/28/2024 0418 by Kyandre Okray, Earnie BIRCH, RN Outcome: Progressing   Problem: Clinical Measurements: Goal: Diagnostic test results will improve 05/28/2024 0419 by Terin Cragle, Earnie BIRCH, RN Outcome: Progressing 05/28/2024 0418 by Ashleynicole Mcclees, Earnie BIRCH, RN Outcome: Progressing Goal: Signs and symptoms of infection will decrease 05/28/2024 0419 by Yarissa Reining, Earnie BIRCH, RN Outcome: Progressing 05/28/2024 0418 by Alonnie Bieker, Earnie BIRCH, RN Outcome: Progressing   Problem: Respiratory: Goal: Ability to maintain adequate ventilation will improve 05/28/2024 0419 by Jessicaann Overbaugh, Earnie BIRCH, RN Outcome: Progressing 05/28/2024 0418 by Keshun Berrett, Earnie BIRCH, RN Outcome: Progressing   Problem: Education: Goal: Knowledge of risk factors and measures for prevention of condition will improve 05/28/2024 0419 by Shuayb Schepers, Earnie BIRCH, RN Outcome: Progressing 05/28/2024 0418 by Benen Weida, Earnie BIRCH, RN Outcome: Progressing   Problem: Coping: Goal: Psychosocial and spiritual needs will be supported 05/28/2024 0419 by Maymuna Detzel, Earnie BIRCH, RN Outcome: Progressing 05/28/2024 0418 by Belen Zwahlen, Earnie BIRCH, RN Outcome: Progressing   Problem: Respiratory: Goal: Will maintain a patent airway 05/28/2024 0419 by Kannon Baum, Earnie BIRCH, RN Outcome: Progressing 05/28/2024 0418 by Shaney Deckman, Earnie BIRCH, RN Outcome: Progressing Goal: Complications related to the disease process, condition or treatment will be avoided or minimized 05/28/2024 0419 by Jesslyn Viglione, Earnie BIRCH, RN Outcome: Progressing 05/28/2024 0418 by Avina Eberle, Earnie BIRCH, RN Outcome: Progressing   Problem: Education: Goal: Ability to describe self-care measures that may prevent or decrease complications (Diabetes Survival Skills Education) will improve 05/28/2024 0419 by Nunzio Banet, Earnie BIRCH, RN Outcome: Progressing 05/28/2024 0418 by Andriel Omalley, Earnie BIRCH, RN Outcome: Progressing   Problem: Coping: Goal: Ability to  adjust to condition or change in health will improve 05/28/2024 0419 by Danyl Deems, Earnie BIRCH, RN Outcome: Progressing 05/28/2024 0418 by Marlisa Caridi, Earnie BIRCH, RN Outcome: Progressing   Problem: Fluid Volume: Goal: Ability to maintain a balanced intake and output will improve 05/28/2024 0419 by Alazia Crocket, Earnie BIRCH, RN Outcome: Progressing 05/28/2024 0418 by Evanell Redlich, Earnie BIRCH, RN Outcome: Progressing   Problem: Health Behavior/Discharge Planning: Goal: Ability to manage health-related needs will improve 05/28/2024 0419 by Kriss Perleberg, Earnie BIRCH, RN Outcome: Progressing 05/28/2024 0418 by Aulden Calise, Earnie BIRCH, RN Outcome: Progressing   Problem: Metabolic: Goal: Ability to maintain appropriate glucose levels will improve 05/28/2024 0419 by Tailey Top, Earnie BIRCH, RN Outcome: Progressing 05/28/2024 0418 by Courtenay Creger, Earnie BIRCH, RN Outcome: Progressing   Problem: Nutritional: Goal: Maintenance of adequate nutrition will improve 05/28/2024 0419 by Oshen Wlodarczyk, Earnie BIRCH, RN Outcome: Progressing 05/28/2024 0418 by Aeon Kessner, Earnie BIRCH, RN Outcome: Progressing Goal: Progress toward achieving an optimal weight will improve 05/28/2024 0419 by Kilah Drahos, Earnie BIRCH, RN Outcome: Progressing 05/28/2024 0418 by Leonda Cristo, Earnie BIRCH, RN Outcome: Progressing   Problem: Skin Integrity: Goal: Risk for impaired skin integrity will decrease 05/28/2024 0419 by Allysen Lazo, Earnie BIRCH, RN Outcome: Progressing 05/28/2024 0418 by Heath Tesler, Earnie BIRCH, RN Outcome: Progressing   Problem: Tissue Perfusion: Goal: Adequacy of tissue perfusion will improve 05/28/2024 0419 by Aurea Aronov, Earnie BIRCH, RN Outcome: Progressing 05/28/2024 0418 by Arhianna Ebey, Earnie BIRCH, RN Outcome: Progressing   Problem: Education: Goal: Knowledge of General Education information will improve Description: Including pain rating scale, medication(s)/side effects and non-pharmacologic comfort measures 05/28/2024 0419 by Montserrath Madding, Earnie BIRCH, RN Outcome: Progressing 05/28/2024 0418 by Verdia Bolt, Earnie BIRCH, RN Outcome:  Progressing   Problem: Health Behavior/Discharge Planning: Goal: Ability to manage health-related needs will improve 05/28/2024 0419 by Ezechiel Stooksbury, Earnie BIRCH, RN Outcome: Progressing 05/28/2024 0418 by Dorice Stiggers, Earnie BIRCH, RN Outcome: Progressing  Problem: Clinical Measurements: Goal: Ability to maintain clinical measurements within normal limits will improve 05/28/2024 0419 by Basha Krygier, Earnie BIRCH, RN Outcome: Progressing 05/28/2024 0418 by Maxson Oddo, Earnie BIRCH, RN Outcome: Progressing Goal: Will remain free from infection 05/28/2024 0419 by Kaymon Denomme, Earnie BIRCH, RN Outcome: Progressing 05/28/2024 0418 by Deontay Ladnier, Earnie BIRCH, RN Outcome: Progressing Goal: Diagnostic test results will improve 05/28/2024 0419 by Sheetal Lyall, Earnie BIRCH, RN Outcome: Progressing 05/28/2024 0418 by Gerik Coberly, Earnie BIRCH, RN Outcome: Progressing Goal: Respiratory complications will improve 05/28/2024 0419 by Tianni Escamilla, Earnie BIRCH, RN Outcome: Progressing 05/28/2024 0418 by Jaisen Wiltrout, Earnie BIRCH, RN Outcome: Progressing Goal: Cardiovascular complication will be avoided 05/28/2024 0419 by Adamariz Gillott, Earnie BIRCH, RN Outcome: Progressing 05/28/2024 0418 by Laiden Milles, Earnie BIRCH, RN Outcome: Progressing   Problem: Activity: Goal: Risk for activity intolerance will decrease 05/28/2024 0419 by Lesly Pontarelli, Earnie BIRCH, RN Outcome: Progressing 05/28/2024 0418 by Maja Mccaffery, Earnie BIRCH, RN Outcome: Progressing   Problem: Nutrition: Goal: Adequate nutrition will be maintained 05/28/2024 0419 by Chelcee Korpi, Earnie BIRCH, RN Outcome: Progressing 05/28/2024 0418 by Marilee Ditommaso, Earnie BIRCH, RN Outcome: Progressing   Problem: Coping: Goal: Level of anxiety will decrease 05/28/2024 0419 by Addylin Manke, Earnie BIRCH, RN Outcome: Progressing 05/28/2024 0418 by Mersadez Linden, Earnie BIRCH, RN Outcome: Progressing   Problem: Elimination: Goal: Will not experience complications related to bowel motility 05/28/2024 0419 by Tavita Eastham, Earnie BIRCH, RN Outcome: Progressing 05/28/2024 0418 by Ossiel Marchio, Earnie BIRCH, RN Outcome: Progressing Goal: Will not  experience complications related to urinary retention 05/28/2024 0419 by Valeree Leidy, Earnie BIRCH, RN Outcome: Progressing 05/28/2024 0418 by Guthrie Lemme, Earnie BIRCH, RN Outcome: Progressing   Problem: Pain Managment: Goal: General experience of comfort will improve and/or be controlled 05/28/2024 0419 by Nimrod Wendt, Earnie BIRCH, RN Outcome: Progressing 05/28/2024 0418 by Daziah Hesler, Earnie BIRCH, RN Outcome: Progressing   Problem: Safety: Goal: Ability to remain free from injury will improve 05/28/2024 0419 by Quinne Pires, Earnie BIRCH, RN Outcome: Progressing 05/28/2024 0418 by Kaari Zeigler, Earnie BIRCH, RN Outcome: Progressing   Problem: Skin Integrity: Goal: Risk for impaired skin integrity will decrease 05/28/2024 0419 by Cahlil Sattar, Earnie BIRCH, RN Outcome: Progressing 05/28/2024 0418 by Yaslin Kirtley, Earnie BIRCH, RN Outcome: Progressing

## 2024-05-29 DIAGNOSIS — R6520 Severe sepsis without septic shock: Secondary | ICD-10-CM | POA: Diagnosis not present

## 2024-05-29 DIAGNOSIS — A419 Sepsis, unspecified organism: Secondary | ICD-10-CM | POA: Diagnosis not present

## 2024-05-29 LAB — BASIC METABOLIC PANEL WITH GFR
Anion gap: 7 (ref 5–15)
BUN: 33 mg/dL — ABNORMAL HIGH (ref 8–23)
CO2: 23 mmol/L (ref 22–32)
Calcium: 8.2 mg/dL — ABNORMAL LOW (ref 8.9–10.3)
Chloride: 107 mmol/L (ref 98–111)
Creatinine, Ser: 0.95 mg/dL (ref 0.61–1.24)
GFR, Estimated: >60 mL/min (ref >60)
Glucose, Bld: 142 mg/dL — ABNORMAL HIGH (ref 70–99)
Potassium: 4.2 mmol/L (ref 3.5–5.1)
Sodium: 137 mmol/L (ref 135–145)

## 2024-05-29 LAB — CBC
HCT: 34.1 % — ABNORMAL LOW (ref 39.0–52.0)
Hemoglobin: 11.2 g/dL — ABNORMAL LOW (ref 13.0–17.0)
MCH: 28.6 pg (ref 26.0–34.0)
MCHC: 32.8 g/dL (ref 30.0–36.0)
MCV: 87 fL (ref 80.0–100.0)
Platelets: 221 K/uL (ref 150–400)
RBC: 3.92 MIL/uL — ABNORMAL LOW (ref 4.22–5.81)
RDW: 18.5 % — ABNORMAL HIGH (ref 11.5–15.5)
WBC: 4.4 K/uL (ref 4.0–10.5)
nRBC: 0 % (ref 0.0–0.2)

## 2024-05-29 LAB — GLUCOSE, CAPILLARY
Glucose-Capillary: 134 mg/dL — ABNORMAL HIGH (ref 70–99)
Glucose-Capillary: 233 mg/dL — ABNORMAL HIGH (ref 70–99)
Glucose-Capillary: 229 mg/dL — ABNORMAL HIGH (ref 70–99)
Glucose-Capillary: 203 mg/dL — ABNORMAL HIGH (ref 70–99)

## 2024-05-29 NOTE — Progress Notes (Signed)
 Occupational Therapy Treatment Patient Details Name: Earl [name] MRN: [id number] DOB: [birth year] Today's Date: 05/29/2024   History of present illness 83 y.o. male admitted from [facility] rehab on 03/04/24 with SOB, sacral pain, Afib with RVR. Pt with acute metabolic encephalopathy, rhabdomyolysis and sepsis secondary to sacral wound infection. 4/15 & 4/17 bedside sacral wound debridement. PMhx: adm 3/26-4/08/2024 with fall, AMS, COVID; d/c to SNF rehab. CVA (2021, residual R-side weakness), T2DM, HTN, CHF, MGUS, Afib on Eliquis .   OT comments  Patient received in supine and eager to participate with OT treatment. Patient instructed on bed mobility with max assist to get to EOB. Patient requiring min to mod assist for sitting balance on EOB with patient performing face washing and UE reaching tasks to address sitting balance/tolerance. Patient began complaining of dizziness and asked to return to supine with max to total assist. BP back in supine was 117/74(87).  Patient performing biceps/triceps strengthening with yellow therapy band. Patient will benefit from continued inpatient follow up therapy, <3 hours/day.  Acute OT to continue to follow to address established goals to facilitate DC to next venue of care.        If plan is discharge home, recommend the following:  A lot of help with walking and/or transfers;A lot of help with bathing/dressing/bathroom;Assistance with cooking/housework;Assist for transportation;Help with stairs or ramp for entrance;Two people to help with walking and/or transfers;Assistance with feeding   Equipment Recommendations  Other (comment) (defer)    Recommendations for Other Services      Precautions / Restrictions Precautions Precautions: Fall Recall of Precautions/Restrictions: Impaired Precaution/Restrictions Comments: sacral wound; L hip wound, bilat heel wounds Restrictions Weight Bearing Restrictions Per Provider Order: No       Mobility  Bed Mobility Overal bed mobility: Needs Assistance Bed Mobility: Rolling, Sidelying to Sit, Sit to Supine Rolling: Max assist Sidelying to sit: Max assist   Sit to supine: Max assist   General bed mobility comments: patient instructed on technique and rail use with max assist to get to EOB and scoot to EOB. Max to total assist to return to supine    Transfers                   General transfer comment: deferred     Balance Overall balance assessment: Needs assistance Sitting-balance support: Feet supported Sitting balance-Leahy Scale: Poor Sitting balance - Comments: min to mod assist for sitting balance due to posterior leaning Postural control: Posterior lean                                 ADL either performed or assessed with clinical judgement   ADL Overall ADL's : Needs assistance/impaired     Grooming: Contact guard assist;Wash/dry face;Sitting Grooming Details (indicate cue type and reason): seated on EOB                               General ADL Comments: focused on bed mobility and sitting balance    Extremity/Trunk Assessment              Vision       Perception     Praxis     Communication Communication Communication: Impaired Factors Affecting Communication: Difficulty expressing self;Reduced clarity of speech   Cognition Arousal: Alert Behavior During Therapy: WFL for tasks assessed/performed Cognition: No apparent impairments  Following commands: Impaired Following commands impaired: Follows one step commands with increased time      Cueing   Cueing Techniques: Verbal cues, Tactile cues  Exercises Exercises: General Upper Extremity General Exercises - Upper Extremity Elbow Flexion: Strengthening, 10 reps, Both, Supine, Theraband Theraband Level (Elbow Flexion): Level 1 (Yellow) Elbow Extension: Strengthening, Both, 10 reps, Supine, Theraband Theraband Level  (Elbow Extension): Level 1 (Yellow)    Shoulder Instructions       General Comments patient complaining of dizziness on EOB with BP 117/74 (87) once back in supine    Pertinent Vitals/ Pain       Pain Assessment Pain Assessment: Faces Faces Pain Scale: Hurts even more Pain Location: buttocks and heel wounds Pain Descriptors / Indicators: Grimacing, Guarding, Discomfort Pain Intervention(s): Limited activity within patient's tolerance, Monitored during session, Repositioned  Home Living                                          Prior Functioning/Environment              Frequency  Min 1X/week        Progress Toward Goals  OT Goals(current goals can now be found in the care plan section)  Progress towards OT goals: Progressing toward goals (slow progress)  Acute Rehab OT Goals Patient Stated Goal: move more OT Goal Formulation: With patient Time For Goal Achievement: 06/05/24 Potential to Achieve Goals: Fair ADL Goals Pt Will Perform Eating: with supervision;with adaptive utensils;sitting;bed level Pt Will Perform Grooming: sitting;with supervision Pt Will Perform Upper Body Bathing: with min assist Pt Will Perform Upper Body Dressing: sitting;with min assist Pt Will Perform Lower Body Dressing: sit to/from stand;with contact guard assist Pt Will Transfer to Toilet: with contact guard assist;ambulating;regular height toilet Pt Will Perform Toileting - Clothing Manipulation and hygiene: sit to/from stand;with contact guard assist Pt/caregiver will Perform Home Exercise Program: Both right and left upper extremity;With minimal assist;Increased ROM;Increased strength;With written HEP provided Additional ADL Goal #1: Pt will perform bed mobility with mod A for repositioning in order to prevent skin breakdown Additional ADL Goal #2: Pt daughter will demonstrate independence with PROM HEP for BLE ROM  Plan      Co-evaluation                  AM-PAC OT 6 Clicks Daily Activity     Outcome Measure   Help from another person eating meals?: A Little Help from another person taking care of personal grooming?: A Little Help from another person toileting, which includes using toliet, bedpan, or urinal?: Total Help from another person bathing (including washing, rinsing, drying)?: Total Help from another person to put on and taking off regular upper body clothing?: A Lot Help from another person to put on and taking off regular lower body clothing?: Total 6 Click Score: 11    End of Session    OT Visit Diagnosis: Other abnormalities of gait and mobility (R26.89);Muscle weakness (generalized) (M62.81);History of falling (Z91.81);Feeding difficulties (R63.3);Ataxia, unspecified (R27.0) Pain - part of body:  (sacral and heel wounds)   Activity Tolerance Patient tolerated treatment well   Patient Left in bed;with call bell/phone within reach   Nurse Communication Mobility status        Time: 8589-8570 OT Time Calculation (min): 19 min  Charges: OT General Charges $OT Visit: 1 Visit OT Treatments $Therapeutic Activity: 8-22 mins  Dick Laine, OTA Acute Rehabilitation Services  Office [phone]   Jeb LITTIE Laine 05/29/2024, 2:50 PM

## 2024-05-29 NOTE — Progress Notes (Signed)
 Speech Language Pathology Treatment: Cognitive-Linguistic  Patient Details Name: Earl [name] MRN: [id number] DOB: 24-Aug-1941 Today's Date: 05/29/2024 Time: 8860-8845 SLP Time Calculation (min) (ACUTE ONLY): 15 min  Assessment / Plan / Recommendation Clinical Impression  Pt seen for SLP session to address voice/speech goals. Pt appeared weaker and volumes for IS observed to be lower compared to prior sessions (ranging from 300-475mL on average). Given additional rest breaks and prompting pt to try one inhalation at a time, pt was able to achieve on x2 sets. SLP could not locate EMST in room. Will plan to provide another EMST device next session. Pt encouraged to continue IS exercises on a regular basis. Also updated nurse techs to encourage him to do a few reps of IS exercise when they assist with turning him. Continue SLP PoC at this time.    HPI HPI: Patient is an 83 y.o. 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 He was discharged from the hospital (admitted for acute metabolic encephalopathy, hypotensive, hypoglycemic) to [facility] for rehab. He presented back to the hospital four days later on 03/04/24 with SOB and pain in sacral area. 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 SLP previous saw pt to assess and manage concerns for dysphagia. SLP re-consulted on 05/02/24 for voice assessment.      SLP Plan  Continue with current plan of care         Recommendations                  Oral care BID   Frequent or constant Supervision/Assistance Aphonia (R49.1);Dysarthria and anarthria (R47.1)     Continue with current plan of care     Earl [name]  05/29/2024, 11:59 AM

## 2024-05-29 NOTE — Progress Notes (Signed)
 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 Of note, he was hospitalized 3/26-4/10 with generalized weakness/acute encephalopathy, hypotension, and was COVID PCR positive. He was eventually discharged to SNF and readmitted 4 days later with pain in the sacral area and shortness of breath. He was found to be in A-fib with RVR, tachypneic and was hypotensive on admission for which critical care was consulted. General surgery was consulted for sacral wound debridement status post bedside excision 4/15 and 03/07/2024.    Patient is here almost 3 months now. With his profound debility, wound healing will be challenging.  He also has poor appetite.  He has fluctuating medical status.  Appetite remains challenging.  Wound care continues to follow.  Subjective:  Patient seen and examined.  Patient received wound care yesterday with physical therapy and wound care nursing.  No other overnight events.  Patient tells me he is doing fairly well today.  It was painful to mobilize with therapy.   Assessment & Plan:   Sacral decubitus ulcer, stage IV Infected ulcer Pressure injuries present on admission.  Severe sepsis secondary to pressure ulcers. Concern for infection on admission. Patient was treated with broad-spectrum antibiotics.  Seen by infectious disease.  Completed antibiotic therapy.  Sepsis physiology resolved.   CT on 6/16 concern for possible osteomyelitis. Surgery consulted and performed wound debridement during this hospitalization.  Plastic surgery was also consulted with recommendation for continued wound care and no surgical management. Continue current wound care, continue pressure relieving therapies. He is very weak to work  with PT/ PTT.  Psychiatry advised Zoloft  for depression-dose increased and patient is tolerating.   Acute on chronic pain Continue oxycodone  as needed Continue Tylenol  as needed. LFT stable.   Paroxysmal atrial fibrillation Continue amiodarone , Eliquis .  Remains in sinus rhythm now.   Acute on chronic combined heart failure Anasarca Abdominal distention, thigh swelling has improved now. Continue Tafamidis  and vutrisiran . Now on oral Lasix .  Fluid status has improved.   Left pleural effusion Patient underwent left-sided thoracentesis on 6/18 yielded 600 mL of hazy amber fluid.  Body fluid culture with no growth.   Anemia of chronic disease Stable. Hb above 11.   Dysphagia Speech therapy on board. Diet recommendations: Thin liquid; Dysphagia 1 (puree)   Severe protein malnutrition Continue dietician recommendations, encourage diet, supplements.   Diabetes mellitus type 2, now with hypoglycemia. hemoglobin A1C of 8.4%.  Continue Semglee  and SSI.  Decreased dose of insulin  with poor appetite and low blood sugars.   DVT prophylaxis: SCDs Start: 03/04/24 2151 Place TED hose Start: 03/04/24 2151 apixaban  (ELIQUIS ) tablet 5 mg   Code Status: DNR limited intervention Family Communication: None today.   Disposition Plan: Status is: Inpatient Remains inpatient appropriate because: Complex wound care     Consultants:  Surgery Psychiatry Plastic surgery  Procedures:  Wound debridement  Antimicrobials:  Completed     Objective: Vitals:   05/28/24 2029 05/29/24 0002 05/29/24 0446 05/29/24 0808  BP: 122/87 (!) 111/58 99/67 101/63  Pulse: 69 67 67 67  Resp:  19 19 17   Temp: 97.8 F (36.6 C) 97.8 F (36.6 C) 97.8 F (36.6 C) 97.8 F (36.6 C)  TempSrc: Oral     SpO2: 98% 100% 100% 99%  Weight:  Height:        Intake/Output Summary (Last 24 hours) at 05/29/2024 1205 Last data filed at 05/29/2024 0900 Gross per 24 hour  Intake --  Output 1100 ml  Net -1100  ml   Filed Weights   05/24/24 0448 05/25/24 0500 05/28/24 0500  Weight: 77.1 kg 72.6 kg 72.6 kg    Examination:  General: Frail looking gentleman.  Not in any distress.  Flat affect.  Occasionally anxious. Cardiovascular: S1-S2 normal.  Regular rate rhythm. Respiratory: Bilateral clear.  No added sounds. Gastrointestinal: Soft and nontender, bowel sound present.  No evidence of fluid thrill.  No evidence of subcutaneous edema. Ext: Alert awake.  Mostly oriented.  Gross generalized weakness.  Femoral wound      Data Reviewed: I have personally reviewed following labs and imaging studies  CBC: Recent Labs  Lab 05/25/24 0532 05/26/24 0251 05/27/24 0557 05/28/24 0428 05/29/24 0309  WBC 4.8 5.9 4.8 4.9 4.4  HGB 11.6* 12.0* 11.9* 11.8* 11.2*  HCT 36.1* 35.3* 35.9* 34.9* 34.1*  MCV 90.0 88.5 88.2 87.3 87.0  PLT 254 207 230 232 221   Basic Metabolic Panel: Recent Labs  Lab 05/23/24 0313 05/24/24 0304 05/27/24 0557 05/27/24 2129 05/28/24 0428 05/29/24 0309  NA 140 141 141  --  139 137  K 4.6 4.9 4.1 4.3 4.2 4.2  CL 107 107 108  --  108 107  CO2 23 22 22   --  22 23  GLUCOSE 87 103* 59*  --  151* 142*  BUN 33* 41* 33*  --  31* 33*  CREATININE 1.02 1.14 1.05  --  1.02 0.95  CALCIUM  9.1 9.4 9.1  --  8.5* 8.2*  MG  --   --   --  2.0  --   --    GFR: Estimated Creatinine Clearance: 60.5 mL/min (by C-G formula based on SCr of 0.95 mg/dL). Liver Function Tests: Recent Labs  Lab 05/23/24 0313 05/24/24 0304  AST 49* 51*  ALT 62* 64*  ALKPHOS 133* 142*  BILITOT 1.3* 1.1  PROT 5.9* 5.9*  ALBUMIN  3.0* 3.0*   No results for input(s): LIPASE, AMYLASE in the last 168 hours. No results for input(s): AMMONIA in the last 168 hours. Coagulation Profile: No results for input(s): INR, PROTIME in the last 168 hours. Cardiac Enzymes: No results for input(s): CKTOTAL, CKMB, CKMBINDEX, TROPONINI in the last 168 hours. BNP (last 3 results) No results for  input(s): PROBNP in the last 8760 hours. HbA1C: No results for input(s): HGBA1C in the last 72 hours. CBG: Recent Labs  Lab 05/28/24 0759 05/28/24 1220 05/28/24 1603 05/28/24 2027 05/29/24 0740  GLUCAP 143* 199* 254* 203* 134*   Lipid Profile: No results for input(s): CHOL, HDL, LDLCALC, TRIG, CHOLHDL, LDLDIRECT in the last 72 hours. Thyroid  Function Tests: No results for input(s): TSH, T4TOTAL, FREET4, T3FREE, THYROIDAB in the last 72 hours. Anemia Panel: No results for input(s): VITAMINB12, FOLATE, FERRITIN, TIBC, IRON, RETICCTPCT in the last 72 hours. Sepsis Labs: No results for input(s): PROCALCITON, LATICACIDVEN in the last 168 hours.  No results found for this or any previous visit (from the past 240 hours).       Radiology Studies: No results found.      Scheduled Meds:  amiodarone   200 mg Oral Daily   apixaban   5 mg Oral BID   ascorbic acid   500 mg Oral BID   atorvastatin   40 mg Oral Daily   Chlorhexidine  Gluconate Cloth  6 each Topical Daily  collagenase    Topical Daily   diclofenac  Sodium  4 g Topical QID   feeding supplement  237 mL Oral BID BM   ferrous sulfate   325 mg Oral QHS   furosemide   20 mg Oral Daily   guaiFENesin   600 mg Oral BID   insulin  aspart  0-6 Units Subcutaneous TID WC   insulin  glargine-yfgn  6 Units Subcutaneous Daily   levothyroxine   50 mcg Oral Q0600   liver oil-zinc  oxide   Topical BID   multivitamin with minerals  1 tablet Oral Daily   nutrition supplement (JUVEN)  1 packet Oral BID BM   mouth rinse  15 mL Mouth Rinse 4 times per day   polyethylene glycol  17 g Oral BID   potassium chloride   20 mEq Oral Daily   senna-docusate  1 tablet Oral QHS   sertraline   50 mg Oral Daily   sodium chloride  flush  10-40 mL Intracatheter Q12H   Tafamidis   1 capsule Oral Daily   vitamin A   10,000 Units Oral Daily   vutrisiran  sodium  25 mg Subcutaneous Q90 days   Continuous Infusions:   dextrose        LOS: 86 days    Time spent: 40 minutes    Renato Applebaum, MD [facility]

## 2024-05-29 NOTE — Plan of Care (Signed)
 Problem: Fluid Volume: Goal: Hemodynamic stability will improve 05/29/2024 1711 by Jacques Cree D, LPN Outcome: Progressing 05/29/2024 1711 by Jacques Cree D, LPN Outcome: Progressing   Problem: Clinical Measurements: Goal: Diagnostic test results will improve 05/29/2024 1711 by Jacques Cree D, LPN Outcome: Progressing 05/29/2024 1711 by Jacques Cree D, LPN Outcome: Progressing Goal: Signs and symptoms of infection will decrease 05/29/2024 1711 by Jacques Cree D, LPN Outcome: Progressing 05/29/2024 1711 by Jacques Cree D, LPN Outcome: Progressing   Problem: Respiratory: Goal: Ability to maintain adequate ventilation will improve 05/29/2024 1711 by Jacques Cree D, LPN Outcome: Progressing 05/29/2024 1711 by Jacques Cree D, LPN Outcome: Progressing   Problem: Education: Goal: Knowledge of risk factors and measures for prevention of condition will improve 05/29/2024 1711 by Jacques Cree D, LPN Outcome: Progressing 05/29/2024 1711 by Jacques Cree D, LPN Outcome: Progressing   Problem: Coping: Goal: Psychosocial and spiritual needs will be supported 05/29/2024 1711 by Jacques Cree D, LPN Outcome: Progressing 05/29/2024 1711 by Jacques Cree D, LPN Outcome: Progressing   Problem: Respiratory: Goal: Will maintain a patent airway 05/29/2024 1711 by Jacques Cree D, LPN Outcome: Progressing 05/29/2024 1711 by Jacques Cree D, LPN Outcome: Progressing Goal: Complications related to the disease process, condition or treatment will be avoided or minimized 05/29/2024 1711 by Jacques Cree D, LPN Outcome: Progressing 05/29/2024 1711 by Jacques Cree D, LPN Outcome: Progressing   Problem: Education: Goal: Ability to describe self-care measures that may prevent or decrease complications (Diabetes Survival Skills Education) will improve 05/29/2024 1711 by Jacques Cree D, LPN Outcome: Progressing 05/29/2024 1711 by Jacques Cree D, LPN Outcome:  Progressing   Problem: Coping: Goal: Ability to adjust to condition or change in health will improve 05/29/2024 1711 by Jacques Cree D, LPN Outcome: Progressing 05/29/2024 1711 by Jacques Cree D, LPN Outcome: Progressing   Problem: Fluid Volume: Goal: Ability to maintain a balanced intake and output will improve 05/29/2024 1711 by Jacques Cree D, LPN Outcome: Progressing 05/29/2024 1711 by Jacques Cree D, LPN Outcome: Progressing   Problem: Health Behavior/Discharge Planning: Goal: Ability to manage health-related needs will improve 05/29/2024 1711 by Jacques Cree D, LPN Outcome: Progressing 05/29/2024 1711 by Jacques Cree D, LPN Outcome: Progressing   Problem: Metabolic: Goal: Ability to maintain appropriate glucose levels will improve 05/29/2024 1711 by Jacques Cree D, LPN Outcome: Progressing 05/29/2024 1711 by Jacques Cree D, LPN Outcome: Progressing   Problem: Nutritional: Goal: Maintenance of adequate nutrition will improve 05/29/2024 1711 by Jacques Cree D, LPN Outcome: Progressing 05/29/2024 1711 by Jacques Cree D, LPN Outcome: Progressing Goal: Progress toward achieving an optimal weight will improve 05/29/2024 1711 by Jacques Cree D, LPN Outcome: Progressing 05/29/2024 1711 by Jacques Cree D, LPN Outcome: Progressing   Problem: Skin Integrity: Goal: Risk for impaired skin integrity will decrease 05/29/2024 1711 by Jacques Cree D, LPN Outcome: Progressing 05/29/2024 1711 by Jacques Cree D, LPN Outcome: Progressing   Problem: Tissue Perfusion: Goal: Adequacy of tissue perfusion will improve 05/29/2024 1711 by Jacques Cree D, LPN Outcome: Progressing 05/29/2024 1711 by Jacques Cree D, LPN Outcome: Progressing   Problem: Education: Goal: Knowledge of General Education information will improve Description: Including pain rating scale, medication(s)/side effects and non-pharmacologic comfort measures 05/29/2024 1711 by  Jacques Cree D, LPN Outcome: Progressing 05/29/2024 1711 by Jacques Cree D, LPN Outcome: Progressing   Problem: Health Behavior/Discharge Planning: Goal: Ability to manage health-related needs will improve 05/29/2024 1711 by Jacques Cree D, LPN Outcome: Progressing 05/29/2024 1711 by Jacques Cree D, LPN Outcome: Progressing  Problem: Clinical Measurements: Goal: Ability to maintain clinical measurements within normal limits will improve 05/29/2024 1711 by Jacques Cree D, LPN Outcome: Progressing 05/29/2024 1711 by Jacques Cree D, LPN Outcome: Progressing Goal: Will remain free from infection 05/29/2024 1711 by Jacques Cree D, LPN Outcome: Progressing 05/29/2024 1711 by Jacques Cree D, LPN Outcome: Progressing Goal: Diagnostic test results will improve 05/29/2024 1711 by Jacques Cree D, LPN Outcome: Progressing 05/29/2024 1711 by Jacques Cree D, LPN Outcome: Progressing Goal: Respiratory complications will improve 05/29/2024 1711 by Jacques Cree D, LPN Outcome: Progressing 05/29/2024 1711 by Jacques Cree D, LPN Outcome: Progressing Goal: Cardiovascular complication will be avoided 05/29/2024 1711 by Jacques Cree D, LPN Outcome: Progressing 05/29/2024 1711 by Jacques Cree D, LPN Outcome: Progressing   Problem: Activity: Goal: Risk for activity intolerance will decrease 05/29/2024 1711 by Jacques Cree D, LPN Outcome: Progressing 05/29/2024 1711 by Jacques Cree D, LPN Outcome: Progressing   Problem: Nutrition: Goal: Adequate nutrition will be maintained 05/29/2024 1711 by Jacques Cree D, LPN Outcome: Progressing 05/29/2024 1711 by Jacques Cree D, LPN Outcome: Progressing   Problem: Coping: Goal: Level of anxiety will decrease 05/29/2024 1711 by Jacques Cree D, LPN Outcome: Progressing 05/29/2024 1711 by Jacques Cree D, LPN Outcome: Progressing   Problem: Elimination: Goal: Will not experience complications related to  bowel motility 05/29/2024 1711 by Jacques Cree D, LPN Outcome: Progressing 05/29/2024 1711 by Jacques Cree D, LPN Outcome: Progressing Goal: Will not experience complications related to urinary retention 05/29/2024 1711 by Jacques Cree D, LPN Outcome: Progressing 05/29/2024 1711 by Jacques Cree D, LPN Outcome: Progressing   Problem: Pain Managment: Goal: General experience of comfort will improve and/or be controlled 05/29/2024 1711 by Jacques Cree D, LPN Outcome: Progressing 05/29/2024 1711 by Jacques Cree D, LPN Outcome: Progressing   Problem: Safety: Goal: Ability to remain free from injury will improve 05/29/2024 1711 by Jacques Cree D, LPN Outcome: Progressing 05/29/2024 1711 by Jacques Cree D, LPN Outcome: Progressing   Problem: Skin Integrity: Goal: Risk for impaired skin integrity will decrease 05/29/2024 1711 by Jacques Cree D, LPN Outcome: Progressing 05/29/2024 1711 by Jacques Cree D, LPN Outcome: Progressing

## 2024-05-30 DIAGNOSIS — A419 Sepsis, unspecified organism: Secondary | ICD-10-CM | POA: Diagnosis not present

## 2024-05-30 DIAGNOSIS — R6520 Severe sepsis without septic shock: Secondary | ICD-10-CM | POA: Diagnosis not present

## 2024-05-30 LAB — GLUCOSE, CAPILLARY
Glucose-Capillary: 86 mg/dL (ref 70–99)
Glucose-Capillary: 170 mg/dL — ABNORMAL HIGH (ref 70–99)
Glucose-Capillary: 135 mg/dL — ABNORMAL HIGH (ref 70–99)
Glucose-Capillary: 171 mg/dL — ABNORMAL HIGH (ref 70–99)

## 2024-05-30 NOTE — Progress Notes (Signed)
 Physical Therapy Treatment Patient Details Name: Earl [name] MRN: [id number] DOB: 12-06-1940 Today's Date: 05/30/2024   History of Present Illness 83 y.o. male admitted from [facility] rehab on 03/04/24 with SOB, sacral pain, Afib with RVR. Pt with acute metabolic encephalopathy, rhabdomyolysis and sepsis secondary to sacral wound infection. 4/15 & 4/17 bedside sacral wound debridement. PMhx: adm 3/26-4/08/2024 with fall, AMS, COVID; d/c to SNF rehab. CVA (2021, residual R-side weakness), T2DM, HTN, CHF, MGUS, Afib on Eliquis .    PT Comments  Assisted to set up for meal and NT made aware to assist pt once up in chair.  Noted pt initially declines states has had a bad day and feeling sick though participated well once encouraged.  Patient stood with mod A of 2 though cues for anterior hip movement to move Stedy flaps.  Patient encouraged to stay up for at least an hour though not feeling he could do it.  Set up with mobility specialist to return to bed.  PT will continue to follow.     If plan is discharge home, recommend the following: Two people to help with walking and/or transfers;Two people to help with bathing/dressing/bathroom;Assistance with feeding;Assist for transportation   Can travel by private vehicle     No  Equipment Recommendations  Hospital bed;Hoyer lift;Wheelchair cushion (measurements PT);Wheelchair (measurements PT) (air mattress, roho cushion)    Recommendations for Other Services       Precautions / Restrictions Precautions Precautions: Fall Recall of Precautions/Restrictions: Impaired Precaution/Restrictions Comments: sacral wound; L hip wound, bilat heel wounds     Mobility  Bed Mobility Overal bed mobility: Needs Assistance Bed Mobility: Supine to Sit     Supine to sit: Mod assist, HOB elevated, Used rails, +2 for physical assistance     General bed mobility comments: cues for technique, assist for lifting trunk, scooting hips increased time to  initiate    Transfers Overall transfer level: Needs assistance Equipment used: Ambulation equipment used Transfers: Sit to/from Stand Sit to Stand: Mod assist, +2 safety/equipment, +2 physical assistance, From elevated surface Stand pivot transfers: Total assist         General transfer comment: to recliner via Stedy with cues for balance, assist for upright and anterior weight shift to move stedy flaps down Transfer via Lift Equipment: Stedy  Ambulation/Gait                   Stairs             Wheelchair Mobility     Tilt Bed    Modified Rankin (Stroke Patients Only)       Balance Overall balance assessment: Needs assistance Sitting-balance support: Feet supported Sitting balance-Leahy Scale: Poor Sitting balance - Comments: posterior bias Postural control: Posterior lean Standing balance support: Bilateral upper extremity supported Standing balance-Leahy Scale: Zero Standing balance comment: stood with 2 assist in [place]                            Communication Communication Communication: Impaired Factors Affecting Communication: Difficulty expressing self;Reduced clarity of speech  Cognition Arousal: Alert Behavior During Therapy: WFL for tasks assessed/performed   PT - Cognitive impairments: Problem solving, Safety/Judgement Difficult to assess due to: Impaired communication                     PT - Cognition Comments: initially c/o feeling sick but participates with encouragement and reminder not OOB other times today  Following commands: Impaired Following commands impaired: Follows one step commands with increased time    Cueing Cueing Techniques: Verbal cues, Gestural cues  Exercises      General Comments        Pertinent Vitals/Pain Pain Assessment Faces Pain Scale: Hurts little more Pain Location: buttocks and heel wounds Pain Descriptors / Indicators: Grimacing, Guarding, Discomfort Pain  Intervention(s): Monitored during session, Repositioned    Home Living                          Prior Function            PT Goals (current goals can now be found in the care plan section) Progress towards PT goals: Progressing toward goals    Frequency    Min 1X/week      PT Plan      Co-evaluation              AM-PAC PT 6 Clicks Mobility   Outcome Measure  Help needed turning from your back to your side while in a flat bed without using bedrails?: Total Help needed moving from lying on your back to sitting on the side of a flat bed without using bedrails?: Total Help needed moving to and from a bed to a chair (including a wheelchair)?: Total Help needed standing up from a chair using your arms (e.g., wheelchair or bedside chair)?: Total Help needed to walk in hospital room?: Total Help needed climbing 3-5 steps with a railing? : Total 6 Click Score: 6    End of Session Equipment Utilized During Treatment: Gait belt Activity Tolerance: Patient limited by pain;Patient limited by fatigue Patient left: in chair;with call bell/phone within reach;with chair alarm set   PT Visit Diagnosis: Muscle weakness (generalized) (M62.81);Adult, failure to thrive (R62.7);Other abnormalities of gait and mobility (R26.89);Unsteadiness on feet (R26.81);Difficulty in walking, not elsewhere classified (R26.2)     Time: 8864-8794 PT Time Calculation (min) (ACUTE ONLY): 30 min  Charges:    $Therapeutic Activity: 23-37 mins PT General Charges $$ ACUTE PT VISIT: 1 Visit                     Micheline Portal, PT Acute Rehabilitation Services Office:[phone] 05/30/2024    Montie Portal 05/30/2024, 5:09 PM

## 2024-05-30 NOTE — Plan of Care (Signed)
   Medical records reviewed including progress notes, labs and imaging.  He continues to have poor oral intake and weakness. Focus of care remains on wound healing and participation in therapies.  Goals of care are clear for medical optimization and return home to continue managing chronic illnesses.    PMT will continue to follow peripherally and remains available for additional palliative needs.  Thank you for your referral and allowing PMT to assist in [name]'s care.   Erin Obando, PA-C Palliative Medicine Team  Team Phone # [phone]   NO CHARGE

## 2024-05-30 NOTE — Progress Notes (Signed)
 Mobility Specialist: Progress Note   05/30/24 1559  Mobility  Activity Transferred from chair to bed  Level of Assistance +2 (takes two people)  Assistive Device MaxiMove  Activity Response Tolerated fair  Mobility visit 1 Mobility  Mobility Specialist Start Time (ACUTE ONLY) 1239  Mobility Specialist Stop Time (ACUTE ONLY) 1256  Mobility Specialist Time Calculation (min) (ACUTE ONLY) 17 min    Pt received in chair, requesting to get back to bed d/t pain. TotA+2 with NT to transfer back to bed via maximove. MaxA for bed mobility. Left in bed with all needs met, call bell in reach.   Ileana Lute Mobility Specialist Please contact via SecureChat or Rehab office at [phone]

## 2024-05-30 NOTE — Progress Notes (Signed)
 PROGRESS NOTE    Earl [name]  FMW:[id number] DOB: 04/11/1941 DOA: 03/04/2024 PCP: Earl Madden, MD    Brief Narrative:  83 year old male with history of PAF on Eliquis , IDDM 2, HTN, CVA with right-sided residual weakness, MGUS, BPH, systolic CHF amyloidosis who was admitted with sacral pain. Of note, he was hospitalized 3/26-4/10 with generalized weakness/acute encephalopathy, hypotension, and was COVID PCR positive. He was eventually discharged to SNF and readmitted 4 days later with pain in the sacral area and shortness of breath. He was found to be in A-fib with RVR, tachypneic and was hypotensive on admission for which critical care was consulted. General surgery was consulted for sacral wound debridement status post bedside excision 4/15 and 03/07/2024.    Patient is here almost 3 months now. With his profound debility, wound healing will be challenging.  He also has poor appetite.  He has fluctuating medical status.  Appetite remains challenging.  Wound care continues to follow.  Subjective:  Patient seen and examined.  No new events.  He attempted to work with therapist yesterday.  Patient tells me that he did not eat well last night.   Assessment & Plan:   Sacral decubitus ulcer, stage IV Infected ulcer Pressure injuries present on admission.  Severe sepsis secondary to pressure ulcers. Concern for infection on admission. Patient was treated with broad-spectrum antibiotics.  Seen by infectious disease.  Completed antibiotic therapy.  Sepsis physiology resolved.   CT on 6/16 concern for possible osteomyelitis. Surgery consulted and performed wound debridement during this hospitalization.  Plastic surgery was also consulted with recommendation for continued wound care and no surgical management. Continue current wound care, continue pressure relieving therapies. He is very weak to work with PT/ PTT.  Psychiatry advised Zoloft  for depression-dose increased and patient is  tolerating.   Acute on chronic pain Continue oxycodone  as needed Continue Tylenol  as needed. LFT stable.   Paroxysmal atrial fibrillation Continue amiodarone , Eliquis .  Remains in sinus rhythm now.   Acute on chronic combined heart failure Anasarca Abdominal distention, thigh swelling has improved now. Continue Tafamidis  and vutrisiran . Now on oral Lasix .  Fluid status has improved.   Left pleural effusion Patient underwent left-sided thoracentesis on 6/18 yielded 600 mL of hazy amber fluid.  Body fluid culture with no growth.   Anemia of chronic disease Stable. Hb above 11.   Dysphagia Speech therapy on board. Diet recommendations: Thin liquid; Dysphagia 1 (puree)   Severe protein malnutrition Continue dietician recommendations, encourage diet, supplements.   Diabetes mellitus type 2, now with hypoglycemia. hemoglobin A1C of 8.4%.  Continue Semglee  and SSI.  Decreased dose of insulin  with poor appetite and low blood sugars.   DVT prophylaxis: SCDs Start: 03/04/24 2151 Place TED hose Start: 03/04/24 2151 apixaban  (ELIQUIS ) tablet 5 mg   Code Status: DNR limited intervention Family Communication: None today.   Disposition Plan: Status is: Inpatient Remains inpatient appropriate because: Complex wound care     Consultants:  Surgery Psychiatry Plastic surgery  Procedures:  Wound debridement  Antimicrobials:  Completed     Objective: Vitals:   05/30/24 0009 05/30/24 0500 05/30/24 0517 05/30/24 0834  BP: 117/67  117/74 124/73  Pulse: 74  70 74  Resp: 19  20 18   Temp: (!) 97.4 F (36.3 C)  97.7 F (36.5 C) 97.6 F (36.4 C)  TempSrc:      SpO2: 100%  100% 100%  Weight:  72.6 kg    Height:        Intake/Output Summary (  Last 24 hours) at 05/30/2024 1110 Last data filed at 05/30/2024 0857 Gross per 24 hour  Intake 460 ml  Output 1350 ml  Net -890 ml   Filed Weights   05/25/24 0500 05/28/24 0500 05/30/24 0500  Weight: 72.6 kg 72.6 kg 72.6 kg     Examination:  General: Frail looking gentleman.  Not in any distress. Cardiovascular: S1-S2 normal.  Regular rate rhythm. Respiratory: Bilateral clear.  No added sounds. Gastrointestinal: Soft and nontender, bowel sound present.  No evidence of fluid thrill.  No evidence of subcutaneous edema. Ext: Alert awake.  Mostly oriented.  Gross generalized weakness.  Femoral wound  Picture in the chart.    Data Reviewed: I have personally reviewed following labs and imaging studies  CBC: Recent Labs  Lab 05/25/24 0532 05/26/24 0251 05/27/24 0557 05/28/24 0428 05/29/24 0309  WBC 4.8 5.9 4.8 4.9 4.4  HGB 11.6* 12.0* 11.9* 11.8* 11.2*  HCT 36.1* 35.3* 35.9* 34.9* 34.1*  MCV 90.0 88.5 88.2 87.3 87.0  PLT 254 207 230 232 221   Basic Metabolic Panel: Recent Labs  Lab 05/24/24 0304 05/27/24 0557 05/27/24 2129 05/28/24 0428 05/29/24 0309  NA 141 141  --  139 137  K 4.9 4.1 4.3 4.2 4.2  CL 107 108  --  108 107  CO2 22 22  --  22 23  GLUCOSE 103* 59*  --  151* 142*  BUN 41* 33*  --  31* 33*  CREATININE 1.14 1.05  --  1.02 0.95  CALCIUM  9.4 9.1  --  8.5* 8.2*  MG  --   --  2.0  --   --    GFR: Estimated Creatinine Clearance: 60.5 mL/min (by C-G formula based on SCr of 0.95 mg/dL). Liver Function Tests: Recent Labs  Lab 05/24/24 0304  AST 51*  ALT 64*  ALKPHOS 142*  BILITOT 1.1  PROT 5.9*  ALBUMIN  3.0*   No results for input(s): LIPASE, AMYLASE in the last 168 hours. No results for input(s): AMMONIA in the last 168 hours. Coagulation Profile: No results for input(s): INR, PROTIME in the last 168 hours. Cardiac Enzymes: No results for input(s): CKTOTAL, CKMB, CKMBINDEX, TROPONINI in the last 168 hours. BNP (last 3 results) No results for input(s): PROBNP in the last 8760 hours. HbA1C: No results for input(s): HGBA1C in the last 72 hours. CBG: Recent Labs  Lab 05/29/24 0740 05/29/24 1308 05/29/24 1745 05/29/24 2007 05/30/24 0832   GLUCAP 134* 233* 229* 203* 86   Lipid Profile: No results for input(s): CHOL, HDL, LDLCALC, TRIG, CHOLHDL, LDLDIRECT in the last 72 hours. Thyroid  Function Tests: No results for input(s): TSH, T4TOTAL, FREET4, T3FREE, THYROIDAB in the last 72 hours. Anemia Panel: No results for input(s): VITAMINB12, FOLATE, FERRITIN, TIBC, IRON, RETICCTPCT in the last 72 hours. Sepsis Labs: No results for input(s): PROCALCITON, LATICACIDVEN in the last 168 hours.  No results found for this or any previous visit (from the past 240 hours).       Radiology Studies: No results found.      Scheduled Meds:  amiodarone   200 mg Oral Daily   apixaban   5 mg Oral BID   ascorbic acid   500 mg Oral BID   atorvastatin   40 mg Oral Daily   Chlorhexidine  Gluconate Cloth  6 each Topical Daily   collagenase    Topical Daily   diclofenac  Sodium  4 g Topical QID   feeding supplement  237 mL Oral BID BM   ferrous sulfate   325 mg  Oral QHS   furosemide   20 mg Oral Daily   guaiFENesin   600 mg Oral BID   insulin  aspart  0-6 Units Subcutaneous TID WC   insulin  glargine-yfgn  6 Units Subcutaneous Daily   levothyroxine   50 mcg Oral Q0600   liver oil-zinc  oxide   Topical BID   multivitamin with minerals  1 tablet Oral Daily   nutrition supplement (JUVEN)  1 packet Oral BID BM   mouth rinse  15 mL Mouth Rinse 4 times per day   polyethylene glycol  17 g Oral BID   potassium chloride   20 mEq Oral Daily   senna-docusate  1 tablet Oral QHS   sertraline   50 mg Oral Daily   sodium chloride  flush  10-40 mL Intracatheter Q12H   Tafamidis   1 capsule Oral Daily   vitamin A   10,000 Units Oral Daily   vutrisiran  sodium  25 mg Subcutaneous Q90 days   Continuous Infusions:  dextrose        LOS: 87 days    Time spent: 40 minutes    Renato Applebaum, MD [facility]

## 2024-05-30 NOTE — Plan of Care (Signed)
  Problem: Fluid Volume: Goal: Hemodynamic stability will improve Outcome: Progressing   Problem: Respiratory: Goal: Ability to maintain adequate ventilation will improve Outcome: Progressing   Problem: Coping: Goal: Psychosocial and spiritual needs will be supported Outcome: Progressing

## 2024-05-31 ENCOUNTER — Inpatient Hospital Stay (HOSPITAL_COMMUNITY)

## 2024-05-31 DIAGNOSIS — A419 Sepsis, unspecified organism: Secondary | ICD-10-CM | POA: Diagnosis not present

## 2024-05-31 DIAGNOSIS — R6520 Severe sepsis without septic shock: Secondary | ICD-10-CM | POA: Diagnosis not present

## 2024-05-31 LAB — GLUCOSE, CAPILLARY
Glucose-Capillary: 139 mg/dL — ABNORMAL HIGH (ref 70–99)
Glucose-Capillary: 154 mg/dL — ABNORMAL HIGH (ref 70–99)
Glucose-Capillary: 91 mg/dL (ref 70–99)
Glucose-Capillary: 99 mg/dL (ref 70–99)

## 2024-05-31 MED ORDER — CALCIUM CARBONATE ANTACID 500 MG PO CHEW
1.0000 | CHEWABLE_TABLET | Freq: Three times a day (TID) | ORAL | Status: DC
  Administered 2024-05-31 – 2024-07-17 (×147): 200 mg via ORAL
  Filled 2024-05-31 (×139): qty 1

## 2024-05-31 NOTE — Progress Notes (Signed)
 PROGRESS NOTE    Earl [name]  FMW:[id number] DOB: July 09, 1941 DOA: 03/04/2024 PCP: [name], MD    Brief Narrative:  83 year old male with history of PAF on Eliquis , IDDM 2, HTN, CVA with right-sided residual weakness, MGUS, BPH, systolic CHF amyloidosis who was admitted with sacral pain. Of note, he was hospitalized 3/26-4/10 with generalized weakness/acute encephalopathy, hypotension, and was COVID PCR positive. He was eventually discharged to SNF and readmitted 4 days later with pain in the sacral area and shortness of breath. He was found to be in A-fib with RVR, tachypneic and was hypotensive on admission for which critical care was consulted. General surgery was consulted for sacral wound debridement status post bedside excision 4/15 and 03/07/2024.    Patient is here almost 3 months now. With his profound debility, wound healing is challenging.  He also has poor appetite.   Subjective:  Patient seen and examined.  He had just worked with physical therapy for hydrotherapy and he feels exhausted.  He complained of a headache.   Assessment & Plan:   Sacral decubitus ulcer, stage IV Infected ulcer Pressure injuries present on admission.  Severe sepsis secondary to pressure ulcers. Concern for infection on admission. Patient was treated with broad-spectrum antibiotics.  Seen by infectious disease.  Completed antibiotic therapy.  Sepsis physiology resolved.   CT on 6/16 concern for possible osteomyelitis. Surgery consulted and performed wound debridement during this hospitalization.  Plastic surgery was also consulted with recommendation for continued wound care and no surgical management. Continue current wound care, continue pressure relieving therapies. He is very weak to work with PT/ PTT.  Psychiatry advised Zoloft  for depression-dose increased and patient is tolerating. Wound care reported more sloughing of the left hip wound, will repeat CT scan today to see extent  of wound.   Acute on chronic pain Continue oxycodone  as needed Continue Tylenol  as needed. LFT stable.   Paroxysmal atrial fibrillation Continue amiodarone , Eliquis .  Remains in sinus rhythm now.   Acute on chronic combined heart failure Anasarca Abdominal distention, thigh swelling has improved now. Continue Tafamidis  and vutrisiran . Now on oral Lasix .  Fluid status has improved.   Left pleural effusion Patient underwent left-sided thoracentesis on 6/18 yielded 600 mL of hazy amber fluid.  Body fluid culture with no growth.   Anemia of chronic disease Stable. Hb above 11.   Dysphagia Speech therapy on board. Diet recommendations: Thin liquid; Dysphagia 1 (puree)   Severe protein malnutrition Continue dietician recommendations, encourage diet, supplements.   Diabetes mellitus type 2, now with hypoglycemia. hemoglobin A1C of 8.4%.  Continue Semglee  and SSI.  Decreased dose of insulin  with poor appetite and low blood sugars.   DVT prophylaxis: SCDs Start: 03/04/24 2151 Place TED hose Start: 03/04/24 2151 apixaban  (ELIQUIS ) tablet 5 mg   Code Status: DNR limited intervention Family Communication: None today.   Disposition Plan: Status is: Inpatient Remains inpatient appropriate because: Complex wound care     Consultants:  Surgery Psychiatry Plastic surgery  Procedures:  Wound debridement  Antimicrobials:  Completed     Objective: Vitals:   05/30/24 2004 05/31/24 0059 05/31/24 0534 05/31/24 0809  BP: 119/72 111/62 122/74 122/76  Pulse: 77 66 74 66  Resp: 20 20  18   Temp: 97.6 F (36.4 C) 97.8 F (36.6 C) (!) 97.3 F (36.3 C) 98.6 F (37 C)  TempSrc:   Oral   SpO2: 98% 100% 100% 100%  Weight:      Height:       No  intake or output data in the 24 hours ending 05/31/24 1147  Filed Weights   05/25/24 0500 05/28/24 0500 05/30/24 0500  Weight: 72.6 kg 72.6 kg 72.6 kg    Examination:  General: Frail looking gentleman.  Not in any distress.   Chronically sick looking.  Anxious today. Cardiovascular: S1-S2 normal.  Regular rate rhythm. Respiratory: Bilateral clear.  No added sounds. Gastrointestinal: Soft and nontender, bowel sound present.  No evidence of fluid thrill.  No evidence of subcutaneous edema. Ext: Alert awake.  Mostly oriented.  Gross generalized weakness.  Femoral wound, bilateral heel wounds, sacral decubitus wounds. Picture in the chart today from physical therapy.    Data Reviewed: I have personally reviewed following labs and imaging studies  CBC: Recent Labs  Lab 05/25/24 0532 05/26/24 0251 05/27/24 0557 05/28/24 0428 05/29/24 0309  WBC 4.8 5.9 4.8 4.9 4.4  HGB 11.6* 12.0* 11.9* 11.8* 11.2*  HCT 36.1* 35.3* 35.9* 34.9* 34.1*  MCV 90.0 88.5 88.2 87.3 87.0  PLT 254 207 230 232 221   Basic Metabolic Panel: Recent Labs  Lab 05/27/24 0557 05/27/24 2129 05/28/24 0428 05/29/24 0309  NA 141  --  139 137  K 4.1 4.3 4.2 4.2  CL 108  --  108 107  CO2 22  --  22 23  GLUCOSE 59*  --  151* 142*  BUN 33*  --  31* 33*  CREATININE 1.05  --  1.02 0.95  CALCIUM  9.1  --  8.5* 8.2*  MG  --  2.0  --   --    GFR: Estimated Creatinine Clearance: 60.5 mL/min (by C-G formula based on SCr of 0.95 mg/dL). Liver Function Tests: No results for input(s): AST, ALT, ALKPHOS, BILITOT, PROT, ALBUMIN  in the last 168 hours.  No results for input(s): LIPASE, AMYLASE in the last 168 hours. No results for input(s): AMMONIA in the last 168 hours. Coagulation Profile: No results for input(s): INR, PROTIME in the last 168 hours. Cardiac Enzymes: No results for input(s): CKTOTAL, CKMB, CKMBINDEX, TROPONINI in the last 168 hours. BNP (last 3 results) No results for input(s): PROBNP in the last 8760 hours. HbA1C: No results for input(s): HGBA1C in the last 72 hours. CBG: Recent Labs  Lab 05/30/24 0832 05/30/24 1111 05/30/24 1640 05/30/24 2005 05/31/24 0808  GLUCAP 86 170* 135* 171*  139*   Lipid Profile: No results for input(s): CHOL, HDL, LDLCALC, TRIG, CHOLHDL, LDLDIRECT in the last 72 hours. Thyroid  Function Tests: No results for input(s): TSH, T4TOTAL, FREET4, T3FREE, THYROIDAB in the last 72 hours. Anemia Panel: No results for input(s): VITAMINB12, FOLATE, FERRITIN, TIBC, IRON, RETICCTPCT in the last 72 hours. Sepsis Labs: No results for input(s): PROCALCITON, LATICACIDVEN in the last 168 hours.  No results found for this or any previous visit (from the past 240 hours).       Radiology Studies: No results found.      Scheduled Meds:  amiodarone   200 mg Oral Daily   apixaban   5 mg Oral BID   ascorbic acid   500 mg Oral BID   atorvastatin   40 mg Oral Daily   Chlorhexidine  Gluconate Cloth  6 each Topical Daily   collagenase    Topical Daily   diclofenac  Sodium  4 g Topical QID   feeding supplement  237 mL Oral BID BM   ferrous sulfate   325 mg Oral QHS   furosemide   20 mg Oral Daily   guaiFENesin   600 mg Oral BID   insulin  aspart  0-6 Units  Subcutaneous TID WC   insulin  glargine-yfgn  6 Units Subcutaneous Daily   levothyroxine   50 mcg Oral Q0600   liver oil-zinc  oxide   Topical BID   multivitamin with minerals  1 tablet Oral Daily   nutrition supplement (JUVEN)  1 packet Oral BID BM   mouth rinse  15 mL Mouth Rinse 4 times per day   polyethylene glycol  17 g Oral BID   potassium chloride   20 mEq Oral Daily   senna-docusate  1 tablet Oral QHS   sertraline   50 mg Oral Daily   sodium chloride  flush  10-40 mL Intracatheter Q12H   Tafamidis   1 capsule Oral Daily   vitamin A   10,000 Units Oral Daily   vutrisiran  sodium  25 mg Subcutaneous Q90 days   Continuous Infusions:  dextrose        LOS: 88 days    Time spent: 40 minutes    Renato Applebaum, MD [facility]

## 2024-05-31 NOTE — Plan of Care (Signed)
  Problem: Fluid Volume: Goal: Hemodynamic stability will improve Outcome: Progressing   Problem: Clinical Measurements: Goal: Diagnostic test results will improve Outcome: Progressing Goal: Signs and symptoms of infection will decrease Outcome: Progressing   Problem: Respiratory: Goal: Ability to maintain adequate ventilation will improve Outcome: Progressing   Problem: Education: Goal: Knowledge of risk factors and measures for prevention of condition will improve Outcome: Progressing   Problem: Coping: Goal: Psychosocial and spiritual needs will be supported Outcome: Progressing   Problem: Respiratory: Goal: Will maintain a patent airway Outcome: Progressing Goal: Complications related to the disease process, condition or treatment will be avoided or minimized Outcome: Progressing   Problem: Education: Goal: Ability to describe self-care measures that may prevent or decrease complications (Diabetes Survival Skills Education) will improve Outcome: Progressing   Problem: Coping: Goal: Ability to adjust to condition or change in health will improve Outcome: Progressing   Problem: Fluid Volume: Goal: Ability to maintain a balanced intake and output will improve Outcome: Progressing   Problem: Health Behavior/Discharge Planning: Goal: Ability to manage health-related needs will improve Outcome: Progressing   Problem: Metabolic: Goal: Ability to maintain appropriate glucose levels will improve Outcome: Progressing   Problem: Nutritional: Goal: Maintenance of adequate nutrition will improve Outcome: Progressing Goal: Progress toward achieving an optimal weight will improve Outcome: Progressing   Problem: Skin Integrity: Goal: Risk for impaired skin integrity will decrease Outcome: Progressing   Problem: Tissue Perfusion: Goal: Adequacy of tissue perfusion will improve Outcome: Progressing   Problem: Education: Goal: Knowledge of General Education information  will improve Description: Including pain rating scale, medication(s)/side effects and non-pharmacologic comfort measures Outcome: Progressing   Problem: Health Behavior/Discharge Planning: Goal: Ability to manage health-related needs will improve Outcome: Progressing   Problem: Clinical Measurements: Goal: Ability to maintain clinical measurements within normal limits will improve Outcome: Progressing Goal: Will remain free from infection Outcome: Progressing Goal: Diagnostic test results will improve Outcome: Progressing Goal: Respiratory complications will improve Outcome: Progressing Goal: Cardiovascular complication will be avoided Outcome: Progressing   Problem: Activity: Goal: Risk for activity intolerance will decrease Outcome: Progressing   Problem: Nutrition: Goal: Adequate nutrition will be maintained Outcome: Progressing   Problem: Coping: Goal: Level of anxiety will decrease Outcome: Progressing   Problem: Elimination: Goal: Will not experience complications related to bowel motility Outcome: Progressing Goal: Will not experience complications related to urinary retention Outcome: Progressing   Problem: Pain Managment: Goal: General experience of comfort will improve and/or be controlled Outcome: Progressing   Problem: Safety: Goal: Ability to remain free from injury will improve Outcome: Progressing   Problem: Skin Integrity: Goal: Risk for impaired skin integrity will decrease Outcome: Progressing

## 2024-05-31 NOTE — Consult Note (Signed)
 WOC Nurse wound follow up   WTA received communication from PT wound care reporting blue tint to drainage from L hip wound.  Patient completed a 10 day course of Dakin's, currently with Santyl  orders.  WTA communicated findings to provider, with request for scans of L hip.  Will continue current wound care orders and await outcome of scan.  Thank you,  Doyal Polite, RN, MSN, [facility] WOC Team

## 2024-05-31 NOTE — Progress Notes (Signed)
 Physical Therapy Wound Treatment Patient Details  Name: Earl [name] MRN: [id number] Date of Birth: January 22, 1941  Today's Date: 05/31/2024 Time: 9146-9061 Time Calculation (min): 45 min  Subjective  Subjective Assessment Subjective: WOC RN in room for assessment prior to PT wound session Patient and Family Stated Goals: Heal wounds Date of Onset:  (unknown) Prior Treatments: Dressing changes, off weighting  Pain Score:  6/10 PO pain meds given prior to wound therapy   Wound Assessment  Pressure Injury 03/06/24 Hip Anterior;Left;Proximal Unstageable - Full thickness tissue loss in which the base of the injury is covered by slough (yellow, tan, gray, green or brown) and/or eschar (tan, brown or black) in the wound bed. unstageable (Active)  Wound Image   05/28/24 1200  Dressing Type Gauze (Comment);Foam - Lift dressing to assess site every shift;Impregnated gauze (bismuth) 05/31/24 1000  Dressing Changed;Clean, Dry, Intact 05/31/24 1000  Dressing Change Frequency Daily 05/31/24 1000  State of Healing Non-healing 05/31/24 1000  Site / Wound Assessment Pink;Red;Yellow 05/31/24 1000  % Wound base Red or Granulating 5% 05/31/24 1000  % Wound base Yellow/Fibrinous Exudate 90% 05/31/24 1000  % Wound base Black/Eschar 0% 05/21/24 1100  % Wound base Other/Granulation Tissue (Comment) 5% 05/31/24 1000  Peri-wound Assessment Pink 05/29/24 0900  Wound Length (cm) 4.8 cm 05/28/24 1500  Wound Width (cm) 3.7 cm 05/28/24 1500  Wound Depth (cm) 2.2 cm 05/28/24 1500  Wound Surface Area (cm^2) 17.76 cm^2 05/28/24 1500  Wound Volume (cm^3) 39.07 cm^3 05/28/24 1500  Tunneling (cm) 0 05/17/24 1457  Undermining (cm) noted from 3-7o'clock ~0.2 cm but likely more extensive 05/31/24 1000  Margins Unattached edges (unapproximated) 05/31/24 1000  Drainage Amount Minimal 05/31/24 1000  Drainage Description Green;Odor - sweet;Serous 05/31/24 1000  Treatment Cleansed;Debridement (Selective) 05/28/24 1500    Selective Debridement (non-excisional) Selective Debridement (non-excisional) - Location: L hip Selective Debridement (non-excisional) - Tools Used: Forceps, Scissors Selective Debridement (non-excisional) - Tissue Removed: Slough & non-viable adipose tissue    Wound Assessment and Plan  Wound Therapy - Assess/Plan/Recommendations Wound Therapy - Clinical Statement: Unfortunately, drainage on the dressing has regained it's slightly blue green appearence. WOC RN has been notified. Pt's wound has evolved with less necrosed adipose in the presence of application of Santyl , also slouch around the rim of the wound has softened Revealing undermining from 3 - 7 o'clock ~0.2 cm until resistance felt, but likely more extensive. Pt will benefit from continued selective debridement to determine full extent of wound as well as decreasing the bioburden to promote wound healling. Wound Therapy - Functional Problem List: Decreased tolerance for position changes or OOB. Factors Delaying/Impairing Wound Healing: Diabetes Mellitus, Immobility, Infection - systemic/local, Multiple medical problems Hydrotherapy Plan: Debridement, Dressing change, Patient/family education Wound Therapy - Frequency: 2X / week Wound Therapy - Follow Up Recommendations: dressing changes by RN  Wound Therapy Goals- Improve the function of patient's integumentary system by progressing the wound(s) through the phases of wound healing (inflammation - proliferation - remodeling) by: Wound Therapy Goals - Improve the function of patient's integumentary system by progressing the wound(s) through the phases of wound healing by: Decrease Necrotic Tissue to: 20% Decrease Necrotic Tissue - Progress: Not progressing Increase Granulation Tissue to: 80% Increase Granulation Tissue - Progress: Not progressing Goals/treatment plan/discharge plan were made with and agreed upon by patient/family: Yes Time For Goal Achievement: 7 days Wound  Therapy - Potential for Goals: Fair  Goals will be updated until maximal potential achieved or discharge criteria met.  Discharge criteria: when  goals achieved, discharge from hospital, MD decision/surgical intervention, no progress towards goals, refusal/missing three consecutive treatments without notification or medical reason.  GP     Charges PT Wound Care Charges $Wound Debridement up to 20 cm: < or equal to 20 cm $PT Hydrotherapy Dressing: 2 dressings $PT Hydrotherapy Visit: 1 Visit     [name] B. [name] PT, DPT Acute Rehabilitation Services Please use secure chat or  Call Office [phone]   [name] [facility] 05/31/2024, 10:39 AM

## 2024-06-01 DIAGNOSIS — R6520 Severe sepsis without septic shock: Secondary | ICD-10-CM | POA: Diagnosis not present

## 2024-06-01 DIAGNOSIS — A419 Sepsis, unspecified organism: Secondary | ICD-10-CM | POA: Diagnosis not present

## 2024-06-01 LAB — GLUCOSE, CAPILLARY
Glucose-Capillary: 94 mg/dL (ref 70–99)
Glucose-Capillary: 89 mg/dL (ref 70–99)
Glucose-Capillary: 154 mg/dL — ABNORMAL HIGH (ref 70–99)
Glucose-Capillary: 189 mg/dL — ABNORMAL HIGH (ref 70–99)
Glucose-Capillary: 190 mg/dL — ABNORMAL HIGH (ref 70–99)

## 2024-06-01 NOTE — Plan of Care (Signed)
  Problem: Fluid Volume: Goal: Hemodynamic stability will improve Outcome: Progressing   Problem: Clinical Measurements: Goal: Diagnostic test results will improve Outcome: Progressing Goal: Signs and symptoms of infection will decrease Outcome: Progressing   Problem: Respiratory: Goal: Ability to maintain adequate ventilation will improve Outcome: Progressing   Problem: Education: Goal: Knowledge of risk factors and measures for prevention of condition will improve Outcome: Progressing   Problem: Coping: Goal: Psychosocial and spiritual needs will be supported Outcome: Progressing   Problem: Respiratory: Goal: Will maintain a patent airway Outcome: Progressing Goal: Complications related to the disease process, condition or treatment will be avoided or minimized Outcome: Progressing   Problem: Education: Goal: Ability to describe self-care measures that may prevent or decrease complications (Diabetes Survival Skills Education) will improve Outcome: Progressing   Problem: Coping: Goal: Ability to adjust to condition or change in health will improve Outcome: Progressing   Problem: Fluid Volume: Goal: Ability to maintain a balanced intake and output will improve Outcome: Progressing   Problem: Health Behavior/Discharge Planning: Goal: Ability to manage health-related needs will improve Outcome: Progressing   Problem: Metabolic: Goal: Ability to maintain appropriate glucose levels will improve Outcome: Progressing   Problem: Nutritional: Goal: Maintenance of adequate nutrition will improve Outcome: Progressing Goal: Progress toward achieving an optimal weight will improve Outcome: Progressing   Problem: Skin Integrity: Goal: Risk for impaired skin integrity will decrease Outcome: Progressing   Problem: Tissue Perfusion: Goal: Adequacy of tissue perfusion will improve Outcome: Progressing   Problem: Education: Goal: Knowledge of General Education information  will improve Description: Including pain rating scale, medication(s)/side effects and non-pharmacologic comfort measures Outcome: Progressing   Problem: Health Behavior/Discharge Planning: Goal: Ability to manage health-related needs will improve Outcome: Progressing   Problem: Clinical Measurements: Goal: Ability to maintain clinical measurements within normal limits will improve Outcome: Progressing Goal: Will remain free from infection Outcome: Progressing Goal: Diagnostic test results will improve Outcome: Progressing Goal: Respiratory complications will improve Outcome: Progressing Goal: Cardiovascular complication will be avoided Outcome: Progressing   Problem: Activity: Goal: Risk for activity intolerance will decrease Outcome: Progressing   Problem: Nutrition: Goal: Adequate nutrition will be maintained Outcome: Progressing   Problem: Coping: Goal: Level of anxiety will decrease Outcome: Progressing   Problem: Elimination: Goal: Will not experience complications related to bowel motility Outcome: Progressing Goal: Will not experience complications related to urinary retention Outcome: Progressing   Problem: Pain Managment: Goal: General experience of comfort will improve and/or be controlled Outcome: Progressing   Problem: Safety: Goal: Ability to remain free from injury will improve Outcome: Progressing   Problem: Skin Integrity: Goal: Risk for impaired skin integrity will decrease Outcome: Progressing

## 2024-06-01 NOTE — Progress Notes (Signed)
 PROGRESS NOTE    Earl [name]  FMW:[id number] DOB: 1941-09-20 DOA: 03/04/2024 PCP: Earl Madden, MD    Brief Narrative:  83 year old male with history of PAF on Eliquis , IDDM 2, HTN, CVA with right-sided residual weakness, MGUS, BPH, systolic CHF amyloidosis who was admitted with sacral pain. Of note, he was hospitalized 3/26-4/10 with generalized weakness/acute encephalopathy, hypotension, and was COVID PCR positive. He was eventually discharged to SNF and readmitted 4 days later with pain in the sacral area and shortness of breath. He was found to be in A-fib with RVR, tachypneic and was hypotensive on admission for which critical care was consulted. General surgery was consulted for sacral wound debridement status post bedside excision 4/15 and 03/07/2024.    Patient is here almost 3 months now. With his profound debility, wound healing is challenging.  He also has poor appetite.  Multidisciplinary approach to get his wound care, start healing and placement.  Subjective:  Patient seen and examined.  Today he denied any complaints.  No family at the bedside.  Patient tells me he ate reasonably well. Wound care nurse communicated with me yesterday that there is some discharge from the left femoral wound, will do repeat CT scan.  Currently no indication for antibiotics.   Assessment & Plan:   Sacral decubitus ulcer, stage IV Infected ulcer Pressure injuries present on admission.  Severe sepsis secondary to pressure ulcers. Concern for infection on admission. Patient was treated with broad-spectrum antibiotics.  Seen by infectious disease.  Completed antibiotic therapy.  Sepsis physiology resolved.   CT on 6/16 concern for possible osteomyelitis. Surgery consulted and performed wound debridement during this hospitalization.  Plastic surgery was also consulted with recommendation for continued wound care and no surgical management. Continue current wound care, continue pressure  relieving therapies. He is very weak to work with PT/ PTT.  Psychiatry advised Zoloft  for depression-dose increased and patient is tolerating. Wound care reported more sloughing of the left hip wound, will repeat CT scan today to see extent of wound.   Acute on chronic pain Continue oxycodone  as needed Continue Tylenol  as needed. LFT stable.   Paroxysmal atrial fibrillation Continue amiodarone , Eliquis .  Remains in sinus rhythm now.   Acute on chronic combined heart failure Anasarca Abdominal distention, thigh swelling has improved now. Continue Tafamidis  and vutrisiran . Now on oral Lasix .  Fluid status has improved.   Left pleural effusion Patient underwent left-sided thoracentesis on 6/18 yielded 600 mL of hazy amber fluid.  Body fluid culture with no growth.   Anemia of chronic disease Stable. Hb above 11.   Dysphagia Speech therapy on board. Diet recommendations: Thin liquid; Dysphagia 1 (puree)   Severe protein malnutrition Continue dietician recommendations, encourage diet, supplements.   Diabetes mellitus type 2, now with hypoglycemia. hemoglobin A1C of 8.4%.  Continue Semglee  and SSI.  Decreased dose of insulin  with poor appetite and low blood sugars.   DVT prophylaxis: SCDs Start: 03/04/24 2151 Place TED hose Start: 03/04/24 2151 apixaban  (ELIQUIS ) tablet 5 mg   Code Status: DNR limited intervention Family Communication: Daughter called to update , could not talk. Will update again .  Disposition Plan: Status is: Inpatient Remains inpatient appropriate because: Complex wound care     Consultants:  Surgery Psychiatry Plastic surgery  Procedures:  Wound debridement  Antimicrobials:  Completed     Objective: Vitals:   05/31/24 1958 06/01/24 0023 06/01/24 0438 06/01/24 0806  BP: 122/69 135/89 126/76 105/75  Pulse:    71  Resp: 20 17  20 18  Temp: (!) 97.5 F (36.4 C) 97.6 F (36.4 C) (!) 97.5 F (36.4 C) (!) 97.5 F (36.4 C)  TempSrc:       SpO2: 98% 95% 100% 98%  Weight:      Height:        Intake/Output Summary (Last 24 hours) at 06/01/2024 9077 Last data filed at 06/01/2024 0600 Gross per 24 hour  Intake 480 ml  Output 1725 ml  Net -1245 ml    Filed Weights   05/25/24 0500 05/28/24 0500 05/30/24 0500  Weight: 72.6 kg 72.6 kg 72.6 kg    Examination:  General: Frail looking gentleman.  Looks fairly comfortable today. Cardiovascular: S1-S2 normal.  Regular rate rhythm. Respiratory: Bilateral clear.  No added sounds. Gastrointestinal: Soft and nontender, bowel sound present.  No evidence of fluid thrill.  No evidence of subcutaneous edema. Ext: Alert awake.  Mostly oriented.  Profound generalized weakness.  Femoral wound, bilateral heel wounds, sacral decubitus wounds. Picture in the chart  from physical therapy.    Data Reviewed: I have personally reviewed following labs and imaging studies  CBC: Recent Labs  Lab 05/26/24 0251 05/27/24 0557 05/28/24 0428 05/29/24 0309  WBC 5.9 4.8 4.9 4.4  HGB 12.0* 11.9* 11.8* 11.2*  HCT 35.3* 35.9* 34.9* 34.1*  MCV 88.5 88.2 87.3 87.0  PLT 207 230 232 221   Basic Metabolic Panel: Recent Labs  Lab 05/27/24 0557 05/27/24 2129 05/28/24 0428 05/29/24 0309  NA 141  --  139 137  K 4.1 4.3 4.2 4.2  CL 108  --  108 107  CO2 22  --  22 23  GLUCOSE 59*  --  151* 142*  BUN 33*  --  31* 33*  CREATININE 1.05  --  1.02 0.95  CALCIUM  9.1  --  8.5* 8.2*  MG  --  2.0  --   --    GFR: Estimated Creatinine Clearance: 60.5 mL/min (by C-G formula based on SCr of 0.95 mg/dL). Liver Function Tests: No results for input(s): AST, ALT, ALKPHOS, BILITOT, PROT, ALBUMIN  in the last 168 hours.  No results for input(s): LIPASE, AMYLASE in the last 168 hours. No results for input(s): AMMONIA in the last 168 hours. Coagulation Profile: No results for input(s): INR, PROTIME in the last 168 hours. Cardiac Enzymes: No results for input(s): CKTOTAL, CKMB,  CKMBINDEX, TROPONINI in the last 168 hours. BNP (last 3 results) No results for input(s): PROBNP in the last 8760 hours. HbA1C: No results for input(s): HGBA1C in the last 72 hours. CBG: Recent Labs  Lab 05/31/24 1259 05/31/24 1622 05/31/24 2059 06/01/24 0442 06/01/24 0805  GLUCAP 154* 91 99 94 89   Lipid Profile: No results for input(s): CHOL, HDL, LDLCALC, TRIG, CHOLHDL, LDLDIRECT in the last 72 hours. Thyroid  Function Tests: No results for input(s): TSH, T4TOTAL, FREET4, T3FREE, THYROIDAB in the last 72 hours. Anemia Panel: No results for input(s): VITAMINB12, FOLATE, FERRITIN, TIBC, IRON, RETICCTPCT in the last 72 hours. Sepsis Labs: No results for input(s): PROCALCITON, LATICACIDVEN in the last 168 hours.  No results found for this or any previous visit (from the past 240 hours).       Radiology Studies: No results found.      Scheduled Meds:  amiodarone   200 mg Oral Daily   apixaban   5 mg Oral BID   ascorbic acid   500 mg Oral BID   atorvastatin   40 mg Oral Daily   calcium  carbonate  1 tablet Oral TID  Chlorhexidine  Gluconate Cloth  6 each Topical Daily   collagenase    Topical Daily   diclofenac  Sodium  4 g Topical QID   feeding supplement  237 mL Oral BID BM   ferrous sulfate   325 mg Oral QHS   furosemide   20 mg Oral Daily   guaiFENesin   600 mg Oral BID   insulin  aspart  0-6 Units Subcutaneous TID WC   insulin  glargine-yfgn  6 Units Subcutaneous Daily   levothyroxine   50 mcg Oral Q0600   liver oil-zinc  oxide   Topical BID   multivitamin with minerals  1 tablet Oral Daily   nutrition supplement (JUVEN)  1 packet Oral BID BM   mouth rinse  15 mL Mouth Rinse 4 times per day   polyethylene glycol  17 g Oral BID   potassium chloride   20 mEq Oral Daily   senna-docusate  1 tablet Oral QHS   sertraline   50 mg Oral Daily   sodium chloride  flush  10-40 mL Intracatheter Q12H   Tafamidis   1 capsule Oral Daily    vitamin A   10,000 Units Oral Daily   vutrisiran  sodium  25 mg Subcutaneous Q90 days   Continuous Infusions:  dextrose        LOS: 89 days    Time spent: 40 minutes    Renato Applebaum, MD [facility]

## 2024-06-02 ENCOUNTER — Inpatient Hospital Stay (HOSPITAL_COMMUNITY)

## 2024-06-02 DIAGNOSIS — R6520 Severe sepsis without septic shock: Secondary | ICD-10-CM | POA: Diagnosis not present

## 2024-06-02 DIAGNOSIS — A419 Sepsis, unspecified organism: Secondary | ICD-10-CM | POA: Diagnosis not present

## 2024-06-02 LAB — CBC WITH DIFFERENTIAL/PLATELET
Abs Immature Granulocytes: 0.03 K/uL (ref 0.00–0.07)
Basophils Absolute: 0 K/uL (ref 0.0–0.1)
Basophils Relative: 1 %
Eosinophils Absolute: 0.2 K/uL (ref 0.0–0.5)
Eosinophils Relative: 5 %
HCT: 37.5 % — ABNORMAL LOW (ref 39.0–52.0)
Hemoglobin: 12.3 g/dL — ABNORMAL LOW (ref 13.0–17.0)
Immature Granulocytes: 1 %
Lymphocytes Relative: 29 %
Lymphs Abs: 1.4 K/uL (ref 0.7–4.0)
MCH: 28.9 pg (ref 26.0–34.0)
MCHC: 32.8 g/dL (ref 30.0–36.0)
MCV: 88 fL (ref 80.0–100.0)
Monocytes Absolute: 0.5 K/uL (ref 0.1–1.0)
Monocytes Relative: 11 %
Neutro Abs: 2.6 K/uL (ref 1.7–7.7)
Neutrophils Relative %: 53 %
Platelets: 231 K/uL (ref 150–400)
RBC: 4.26 MIL/uL (ref 4.22–5.81)
RDW: 18.4 % — ABNORMAL HIGH (ref 11.5–15.5)
WBC: 4.8 K/uL (ref 4.0–10.5)
nRBC: 0 % (ref 0.0–0.2)

## 2024-06-02 LAB — PHOSPHORUS: Phosphorus: 2.6 mg/dL (ref 2.5–4.6)

## 2024-06-02 LAB — COMPREHENSIVE METABOLIC PANEL WITH GFR
ALT: 57 U/L — ABNORMAL HIGH (ref 0–44)
AST: 47 U/L — ABNORMAL HIGH (ref 15–41)
Albumin: 2.7 g/dL — ABNORMAL LOW (ref 3.5–5.0)
Alkaline Phosphatase: 116 U/L (ref 38–126)
Anion gap: 13 (ref 5–15)
BUN: 21 mg/dL (ref 8–23)
CO2: 17 mmol/L — ABNORMAL LOW (ref 22–32)
Calcium: 8.7 mg/dL — ABNORMAL LOW (ref 8.9–10.3)
Chloride: 107 mmol/L (ref 98–111)
Creatinine, Ser: 0.9 mg/dL (ref 0.61–1.24)
GFR, Estimated: >60 mL/min (ref >60)
Glucose, Bld: 172 mg/dL — ABNORMAL HIGH (ref 70–99)
Potassium: 4.3 mmol/L (ref 3.5–5.1)
Sodium: 137 mmol/L (ref 135–145)
Total Bilirubin: 0.9 mg/dL (ref 0.0–1.2)
Total Protein: 5.7 g/dL — ABNORMAL LOW (ref 6.5–8.1)

## 2024-06-02 LAB — GLUCOSE, CAPILLARY
Glucose-Capillary: 157 mg/dL — ABNORMAL HIGH (ref 70–99)
Glucose-Capillary: 168 mg/dL — ABNORMAL HIGH (ref 70–99)
Glucose-Capillary: 229 mg/dL — ABNORMAL HIGH (ref 70–99)
Glucose-Capillary: 213 mg/dL — ABNORMAL HIGH (ref 70–99)

## 2024-06-02 LAB — MAGNESIUM: Magnesium: 1.8 mg/dL (ref 1.7–2.4)

## 2024-06-02 MED ORDER — LIDOCAINE 5 % EX PTCH
1.0000 | MEDICATED_PATCH | CUTANEOUS | Status: DC
  Administered 2024-06-02 – 2024-08-22 (×74): 1 via TRANSDERMAL
  Filled 2024-06-02 (×79): qty 1

## 2024-06-02 MED ORDER — IOHEXOL 350 MG/ML SOLN
75.0000 mL | Freq: Once | INTRAVENOUS | Status: AC | PRN
  Administered 2024-06-02: 75 mL via INTRAVENOUS

## 2024-06-02 MED ORDER — FUROSEMIDE 10 MG/ML IJ SOLN
40.0000 mg | Freq: Once | INTRAMUSCULAR | Status: AC
  Administered 2024-06-02: 40 mg via INTRAVENOUS
  Filled 2024-06-02: qty 4

## 2024-06-02 MED ORDER — MAGNESIUM SULFATE 2 GM/50ML IV SOLN
2.0000 g | Freq: Once | INTRAVENOUS | Status: AC
  Administered 2024-06-02: 2 g via INTRAVENOUS
  Filled 2024-06-02: qty 50

## 2024-06-02 MED ORDER — SODIUM BICARBONATE 650 MG PO TABS
650.0000 mg | ORAL_TABLET | Freq: Two times a day (BID) | ORAL | Status: AC
  Administered 2024-06-02 – 2024-06-04 (×5): 650 mg via ORAL
  Filled 2024-06-02 (×5): qty 1

## 2024-06-02 MED ORDER — ENSURE ENLIVE PO LIQD
237.0000 mL | Freq: Three times a day (TID) | ORAL | Status: DC
  Administered 2024-06-02 – 2024-06-21 (×48): 237 mL via ORAL
  Filled 2024-06-02 (×16): qty 237

## 2024-06-02 MED ORDER — FUROSEMIDE 20 MG PO TABS
20.0000 mg | ORAL_TABLET | Freq: Every day | ORAL | Status: DC
  Administered 2024-06-02 – 2024-06-30 (×29): 20 mg via ORAL
  Filled 2024-06-02 (×30): qty 1

## 2024-06-02 NOTE — Consult Note (Addendum)
   NAME:  Earl [name], MRN:  [id number], DOB:  August 13, 1941, LOS: 90 ADMISSION DATE:  03/04/2024, CONSULTATION DATE:  06/02/2024 REFERRING MD:  Dr Madelyne, CHIEF COMPLAINT: Bilateral pleural effusions  History of Present Illness:  Asked to see patient for bilateral pleural effusion History of paroxysmal atrial fibrillation, type 2 diabetes, hypertension, CVA, MGUS, BPH, systolic congestive heart failure, amyloidosis Ongoing generalized weakness, protein calorie malnutrition.  Was admitted from nursing home Status post sacral wound debridement Significant debility  Had an abdominal CT showing bilateral pleural effusion, complaint of shortness of breath  Pertinent  Medical History   Past Medical History:  Diagnosis Date   Adenomatous colon polyp    Allergy    Anal fissure    Arthritis    BPH (benign prostatic hyperplasia)    Diabetes mellitus (HCC)    GERD (gastroesophageal reflux disease)    Hyperlipidemia    Hypertension    Hypothyroidism    Other testicular hypofunction    Stroke (HCC)    Significant Hospital Events: Including procedures, antibiotic start and stop dates in addition to other pertinent events   CT abdomen-bilateral pleural effusion 05/08/2024-left thoracentesis for 600 hazy amber-colored fluid  Interim History / Subjective:  Has been in the hospital for almost 3 months Worsening shortness of breath on diuretics  Objective    Blood pressure 114/66, pulse 77, temperature 97.6 F (36.4 C), resp. rate 19, height 5' 11 (1.803 m), weight 72.6 kg, SpO2 100%.        Intake/Output Summary (Last 24 hours) at 06/02/2024 1508 Last data filed at 06/02/2024 0401 Gross per 24 hour  Intake --  Output 1000 ml  Net -1000 ml   Filed Weights   05/28/24 0500 05/30/24 0500 06/02/24 0459  Weight: 72.6 kg 72.6 kg 72.6 kg    Examination: General: Elderly, frail, chronically ill appearing HENT: Moist oral mucosa Lungs: Decreased air movement  bilaterally Cardiovascular: S1-S2 appreciated Abdomen: Soft, bowel sounds appreciated Extremities: No clubbing, no edema Neuro: Awake alert interactive GU:   I reviewed last 24 h vitals and pain scores, last 48 h intake and output, last 24 h labs and trends, and last 24 h imaging results.    Resolved problem list   Assessment and Plan   Bilateral pleural effusion -Likely was related to cardiomyopathy, decompensated heart failure - Decompensated diastolic heart failure, mild pulmonary hypertension  He currently is on Eliquis , will hold Eliquis  today and plan for thoracentesis 06/02/2024  Sacral decubitus ulcer Sepsis Osteomyelitis left hip - Further evaluation by Ortho 7/14  Paroxysmal atrial fibrillation - On amiodarone  - On Eliquis , will hold Eliquis  for today  Decompensated diastolic heart failure Cardiac amyloidosis Diastolic heart failure - On diuretics - On Lasix -has been on Lasix  20 mg p.o. daily -Will give 1 dose of Lasix  40 IV today - On tafamidis  and vultrisiran  Left pleural effusion Plan for thoracentesis - Hold Eliquis   Severe protein calorie malnutrition - Optimize caloric intake/proteins  Type 2 diabetes - On Semglee , SSI  Will benefit from thoracentesis, have to balance this with his safety Additional diuretics Plan for possible sequential thoracentesis Ensure to resume Eliquis  following thoracentesis Case request placed for thoracentesis  Discussed with daughter at bedside  Jennet Epley, MD [facility] Pager: See Tracey

## 2024-06-02 NOTE — Progress Notes (Signed)
 PROGRESS NOTE    Earl [name]  FMW:[id number] DOB: [birth year] DOA: 03/04/2024 PCP: [name], MD   Brief Narrative: 83 year old male with history of PAF on Eliquis , IDDM 2, HTN, CVA with right-sided residual weakness, MGUS, BPH, systolic CHF amyloidosis who was admitted with sacral pain. Of note, he was hospitalized 3/26-4/10 with generalized weakness/acute encephalopathy, hypotension, and was COVID PCR positive. He was eventually discharged to SNF and readmitted 4 days later with pain in the sacral area and shortness of breath. He was found to be in A-fib with RVR, tachypneic and was hypotensive on admission for which critical care was consulted. General surgery was consulted for sacral wound debridement status post bedside excision 4/15 and 03/07/2024.    Patient is here almost 3 months now. With his profound debility, wound healing is challenging.  He also has poor appetite.  Multidisciplinary approach to get his wound care, start healing and placement.     Assessment & Plan:   Principal Problem:   Severe sepsis (HCC) Active Problems:   Paroxysmal atrial fibrillation (HCC)   Sepsis (HCC)   Atrial flutter (HCC)   Sacral ulcer (HCC)   MGUS (monoclonal gammopathy of unknown significance)   Essential hypertension   Hyperlipidemia   Insulin  dependent type 2 diabetes mellitus (HCC)   History of CVA (cerebrovascular accident)   Cardiac amyloidosis (HCC)   Chronic systolic CHF (congestive heart failure) (HCC)   GERD (gastroesophageal reflux disease)   History of hypertension   Atrial fibrillation with RVR (HCC)   Community acquired pneumonia   Protein-calorie malnutrition, severe   Wild-type transthyretin-related (ATTR) amyloidosis (HCC)   1-Sacral decubitus ulcer, stage IV Infected ulcer Pressure injuries present on admission.  Severe sepsis secondary to pressure ulcers. -Concern for infection on admission. Patient was treated with broad-spectrum antibiotics.  Seen  by infectious disease.  Completed antibiotic therapy.  Sepsis physiology resolved.   -CT on 6/16 concern for possible osteomyelitis. Surgery consulted and performed wound debridement during this hospitalization.  Plastic surgery was also consulted with recommendation for continued wound care and no surgical management. -Continue current wound care, continue pressure relieving therapies. -Psychiatry advised Zoloft  for depression-dose increased and patient is tolerating. -7/11 Wound care reported more sloughing of the left hip wound,  7/13: repeated CT : Similar appearance of osteomyelitis underlying sacral decubitus ulcer. The ulcer itself is less concave in appearance compared to prior. No new focal drainable fluid collection. Soft tissue irregularity overlying the LEFT lateral hip without suspicious subjacent osseous changes. There is mild adjacent fascial enhancement likely reflecting reactive myositis. No focal drainable fluid collection noted. -Continue with wound care, currently no fever or leukocytosis.  -Send Secure chat message to Ortho, [name] to evaluate patient on 7/14.  Acute on chronic pain Continue oxycodone  as needed Continue Tylenol  as needed. LFT stable. Report headache when move neck forward, added Lidocaine  patch.   Paroxysmal atrial fibrillation Continue amiodarone , Eliquis .     Acute on chronic combined heart failure Anasarca Abdominal distention, thigh swelling has improved.  Continue Tafamidis  and vutrisiran . Continue with lasix , monitor metabolic acidosis.    Left pleural effusion Patient underwent left-sided thoracentesis on 6/18 yielded 600 mL of hazy amber fluid.  Body fluid culture with no growth. CT showed: Moderate to large bilateral pleural effusions.  Will ask CCM to see him for evaluation of thoracentesis.   Anemia of chronic disease Hb stable.  Check B 12 level.    Dysphagia Speech therapy on board. Diet recommendations: Thin liquid; Dysphagia  1 (puree)  Severe protein malnutrition Continue dietician recommendations, encourage diet, supplements.   Diabetes mellitus type 2, now with hypoglycemia. hemoglobin A1C of 8.4%.  Continue Semglee  and SSI.  Decreased dose of insulin  with poor appetite and low blood sugars.   See wound care documentation below.   Pressure Injury 02/26/24 Coccyx Mid Stage 4 - Full thickness tissue loss with exposed bone, tendon or muscle. (Active)  02/26/24 0916  Location: Coccyx  Location Orientation: Mid  Staging: Stage 4 - Full thickness tissue loss with exposed bone, tendon or muscle.  Wound Description (Comments):   DO NOT USE:  Present on Admission: Yes  Dressing Type Foam - Lift dressing to assess site every shift 06/01/24 2040     Pressure Injury 02/26/24 Toe (Comment  which one) Anterior;Right Deep Tissue Pressure Injury - Purple or maroon localized area of discolored intact skin or blood-filled blister due to damage of underlying soft tissue from pressure and/or shear. right gre (Active)  02/26/24 1215  Location: Toe (Comment  which one)  Location Orientation: Anterior;Right  Staging: Deep Tissue Pressure Injury - Purple or maroon localized area of discolored intact skin or blood-filled blister due to damage of underlying soft tissue from pressure and/or shear.  Wound Description (Comments): right great toe  DO NOT USE:  Present on Admission:   Dressing Type None 05/17/24 2056     Pressure Injury 03/06/24 Hip Anterior;Left;Proximal Unstageable - Full thickness tissue loss in which the base of the injury is covered by slough (yellow, tan, gray, green or brown) and/or eschar (tan, brown or black) in the wound bed. unstageable (Active)  03/06/24 1612  Location: Hip  Location Orientation: Anterior;Left;Proximal  Staging: Unstageable - Full thickness tissue loss in which the base of the injury is covered by slough (yellow, tan, gray, green or brown) and/or eschar (tan, brown or black) in the  wound bed.  Wound Description (Comments): unstageable  DO NOT USE:  Present on Admission: No  Dressing Type Foam - Lift dressing to assess site every shift 06/01/24 2040     Pressure Injury 03/06/24 Shoulder Left Unstageable - Full thickness tissue loss in which the base of the injury is covered by slough (yellow, tan, gray, green or brown) and/or eschar (tan, brown or black) in the wound bed. unstageable (Active)  03/06/24 1613  Location: Shoulder  Location Orientation: Left  Staging: Unstageable - Full thickness tissue loss in which the base of the injury is covered by slough (yellow, tan, gray, green or brown) and/or eschar (tan, brown or black) in the wound bed.  Wound Description (Comments): unstageable  DO NOT USE:  Present on Admission: No  Dressing Type None 06/01/24 2040     Pressure Injury 04/23/24 Heel Right Unstageable - Full thickness tissue loss in which the base of the injury is covered by slough (yellow, tan, gray, green or brown) and/or eschar (tan, brown or black) in the wound bed. DTPI evolving into unstageable (Active)  04/23/24   Location: Heel  Location Orientation: Right  Staging: Unstageable - Full thickness tissue loss in which the base of the injury is covered by slough (yellow, tan, gray, green or brown) and/or eschar (tan, brown or black) in the wound bed.  Wound Description (Comments): DTPI evolving into unstageable  DO NOT USE:  Present on Admission: No  Dressing Type Foam - Lift dressing to assess site every shift 06/01/24 2040     Pressure Injury 04/23/24 Heel Left Deep Tissue Pressure Injury - Purple or maroon localized area of  discolored intact skin or blood-filled blister due to damage of underlying soft tissue from pressure and/or shear. (Active)  04/23/24   Location: Heel  Location Orientation: Left  Staging: Deep Tissue Pressure Injury - Purple or maroon localized area of discolored intact skin or blood-filled blister due to damage of underlying soft  tissue from pressure and/or shear.  Wound Description (Comments):   DO NOT USE:  Present on Admission: No  Dressing Type Foam - Lift dressing to assess site every shift 06/01/24 2040     Nutrition Problem: Severe Malnutrition Etiology: chronic illness    Signs/Symptoms: energy intake < or equal to 75% for > or equal to 1 month, severe muscle depletion    Interventions: Ensure Enlive (each supplement provides 350kcal and 20 grams of protein), Magic cup, Hormel Shake, MVI, Juven, Other (Comment) (vitamin C )  Estimated body mass index is 22.32 kg/m as calculated from the following:   Height as of this encounter: 5' 11 (1.803 m).   Weight as of this encounter: 72.6 kg.   DVT prophylaxis: Eliquis  Code Status: DNR limited Family Communication: Disposition Plan:  Status is: Inpatient Remains inpatient appropriate because: management of chronic illness    Consultants:  Plastic sx  Procedures:  Thoracentesis.  Wound debridement.   Antimicrobials:    Subjective: He is alert, chronic ill.  Report headache when he raises his neck.  Eating small amount.   Objective: Vitals:   06/01/24 1956 06/02/24 0008 06/02/24 0400 06/02/24 0459  BP: 118/76 108/69 116/75   Pulse: 80 71 66   Resp: 20 20 20    Temp: 97.9 F (36.6 C) (!) 97.5 F (36.4 C) 97.8 F (36.6 C)   TempSrc:      SpO2: 98% 99% 98%   Weight:    72.6 kg  Height:        Intake/Output Summary (Last 24 hours) at 06/02/2024 0730 Last data filed at 06/02/2024 0401 Gross per 24 hour  Intake 240 ml  Output 1000 ml  Net -760 ml   Filed Weights   05/28/24 0500 05/30/24 0500 06/02/24 0459  Weight: 72.6 kg 72.6 kg 72.6 kg    Examination:  General exam: Appears calm and comfortable  Respiratory system: Clear to auscultation. Respiratory effort normal. Cardiovascular system: S1 & S2 heard, RRR.  Gastrointestinal system: Abdomen is nondistended, soft and nontender. No organomegaly or masses felt. Normal bowel  sounds heard. Central nervous system: Alert and oriented.  Extremities: Symmetric 5 x 5 power.    Data Reviewed: I have personally reviewed following labs and imaging studies  CBC: Recent Labs  Lab 05/27/24 0557 05/28/24 0428 05/29/24 0309 06/02/24 0530  WBC 4.8 4.9 4.4 4.8  NEUTROABS  --   --   --  2.6  HGB 11.9* 11.8* 11.2* 12.3*  HCT 35.9* 34.9* 34.1* 37.5*  MCV 88.2 87.3 87.0 88.0  PLT 230 232 221 231   Basic Metabolic Panel: Recent Labs  Lab 05/27/24 0557 05/27/24 2129 05/28/24 0428 05/29/24 0309  NA 141  --  139 137  K 4.1 4.3 4.2 4.2  CL 108  --  108 107  CO2 22  --  22 23  GLUCOSE 59*  --  151* 142*  BUN 33*  --  31* 33*  CREATININE 1.05  --  1.02 0.95  CALCIUM  9.1  --  8.5* 8.2*  MG  --  2.0  --   --    GFR: Estimated Creatinine Clearance: 60.5 mL/min (by C-G formula based on SCr  of 0.95 mg/dL). Liver Function Tests: No results for input(s): AST, ALT, ALKPHOS, BILITOT, PROT, ALBUMIN  in the last 168 hours. No results for input(s): LIPASE, AMYLASE in the last 168 hours. No results for input(s): AMMONIA in the last 168 hours. Coagulation Profile: No results for input(s): INR, PROTIME in the last 168 hours. Cardiac Enzymes: No results for input(s): CKTOTAL, CKMB, CKMBINDEX, TROPONINI in the last 168 hours. BNP (last 3 results) No results for input(s): PROBNP in the last 8760 hours. HbA1C: No results for input(s): HGBA1C in the last 72 hours. CBG: Recent Labs  Lab 06/01/24 0442 06/01/24 0805 06/01/24 1215 06/01/24 1637 06/01/24 1958  GLUCAP 94 89 154* 189* 190*   Lipid Profile: No results for input(s): CHOL, HDL, LDLCALC, TRIG, CHOLHDL, LDLDIRECT in the last 72 hours. Thyroid  Function Tests: No results for input(s): TSH, T4TOTAL, FREET4, T3FREE, THYROIDAB in the last 72 hours. Anemia Panel: No results for input(s): VITAMINB12, FOLATE, FERRITIN, TIBC, IRON, RETICCTPCT in the last  72 hours. Sepsis Labs: No results for input(s): PROCALCITON, LATICACIDVEN in the last 168 hours.  No results found for this or any previous visit (from the past 240 hours).       Radiology Studies: No results found.      Scheduled Meds:  amiodarone   200 mg Oral Daily   apixaban   5 mg Oral BID   ascorbic acid   500 mg Oral BID   atorvastatin   40 mg Oral Daily   calcium  carbonate  1 tablet Oral TID   Chlorhexidine  Gluconate Cloth  6 each Topical Daily   collagenase    Topical Daily   diclofenac  Sodium  4 g Topical QID   feeding supplement  237 mL Oral BID BM   ferrous sulfate   325 mg Oral QHS   furosemide   20 mg Oral Daily   guaiFENesin   600 mg Oral BID   insulin  aspart  0-6 Units Subcutaneous TID WC   insulin  glargine-yfgn  6 Units Subcutaneous Daily   levothyroxine   50 mcg Oral Q0600   liver oil-zinc  oxide   Topical BID   multivitamin with minerals  1 tablet Oral Daily   nutrition supplement (JUVEN)  1 packet Oral BID BM   mouth rinse  15 mL Mouth Rinse 4 times per day   polyethylene glycol  17 g Oral BID   potassium chloride   20 mEq Oral Daily   senna-docusate  1 tablet Oral QHS   sertraline   50 mg Oral Daily   sodium chloride  flush  10-40 mL Intracatheter Q12H   Tafamidis   1 capsule Oral Daily   vitamin A   10,000 Units Oral Daily   vutrisiran  sodium  25 mg Subcutaneous Q90 days   Continuous Infusions:  dextrose        LOS: 90 days    Time spent: 35 Minutes    Earl [name] A Earl Wronski, MD [facility]   If 7PM-7AM, please contact night-coverage [email or web address]  06/02/2024, 7:30 AM

## 2024-06-02 NOTE — Plan of Care (Signed)
   Problem: Fluid Volume: Goal: Hemodynamic stability will improve Outcome: Progressing

## 2024-06-02 NOTE — Plan of Care (Signed)
  Problem: Fluid Volume: Goal: Hemodynamic stability will improve Outcome: Progressing   Problem: Clinical Measurements: Goal: Diagnostic test results will improve Outcome: Progressing Goal: Signs and symptoms of infection will decrease Outcome: Progressing   Problem: Respiratory: Goal: Ability to maintain adequate ventilation will improve Outcome: Progressing   Problem: Education: Goal: Knowledge of risk factors and measures for prevention of condition will improve Outcome: Progressing   Problem: Coping: Goal: Psychosocial and spiritual needs will be supported Outcome: Progressing   Problem: Respiratory: Goal: Will maintain a patent airway Outcome: Progressing Goal: Complications related to the disease process, condition or treatment will be avoided or minimized Outcome: Progressing   Problem: Education: Goal: Ability to describe self-care measures that may prevent or decrease complications (Diabetes Survival Skills Education) will improve Outcome: Progressing   Problem: Coping: Goal: Ability to adjust to condition or change in health will improve Outcome: Progressing   Problem: Fluid Volume: Goal: Ability to maintain a balanced intake and output will improve Outcome: Progressing   Problem: Health Behavior/Discharge Planning: Goal: Ability to manage health-related needs will improve Outcome: Progressing   Problem: Metabolic: Goal: Ability to maintain appropriate glucose levels will improve Outcome: Progressing   Problem: Nutritional: Goal: Maintenance of adequate nutrition will improve Outcome: Progressing Goal: Progress toward achieving an optimal weight will improve Outcome: Progressing   Problem: Skin Integrity: Goal: Risk for impaired skin integrity will decrease Outcome: Progressing   Problem: Tissue Perfusion: Goal: Adequacy of tissue perfusion will improve Outcome: Progressing   Problem: Education: Goal: Knowledge of General Education information  will improve Description: Including pain rating scale, medication(s)/side effects and non-pharmacologic comfort measures Outcome: Progressing   Problem: Health Behavior/Discharge Planning: Goal: Ability to manage health-related needs will improve Outcome: Progressing   Problem: Clinical Measurements: Goal: Ability to maintain clinical measurements within normal limits will improve Outcome: Progressing Goal: Will remain free from infection Outcome: Progressing Goal: Diagnostic test results will improve Outcome: Progressing Goal: Respiratory complications will improve Outcome: Progressing Goal: Cardiovascular complication will be avoided Outcome: Progressing   Problem: Activity: Goal: Risk for activity intolerance will decrease Outcome: Progressing   Problem: Nutrition: Goal: Adequate nutrition will be maintained Outcome: Progressing   Problem: Coping: Goal: Level of anxiety will decrease Outcome: Progressing   Problem: Elimination: Goal: Will not experience complications related to bowel motility Outcome: Progressing Goal: Will not experience complications related to urinary retention Outcome: Progressing   Problem: Pain Managment: Goal: General experience of comfort will improve and/or be controlled Outcome: Progressing   Problem: Safety: Goal: Ability to remain free from injury will improve Outcome: Progressing   Problem: Skin Integrity: Goal: Risk for impaired skin integrity will decrease Outcome: Progressing

## 2024-06-03 ENCOUNTER — Inpatient Hospital Stay (HOSPITAL_COMMUNITY)

## 2024-06-03 ENCOUNTER — Encounter (HOSPITAL_COMMUNITY): Admission: EM | Disposition: E | Payer: Self-pay | Source: Skilled Nursing Facility | Attending: Internal Medicine

## 2024-06-03 ENCOUNTER — Encounter (HOSPITAL_COMMUNITY): Payer: Self-pay | Admitting: Pulmonary Disease

## 2024-06-03 DIAGNOSIS — I4891 Unspecified atrial fibrillation: Secondary | ICD-10-CM | POA: Diagnosis not present

## 2024-06-03 DIAGNOSIS — A419 Sepsis, unspecified organism: Secondary | ICD-10-CM | POA: Diagnosis not present

## 2024-06-03 DIAGNOSIS — J90 Pleural effusion, not elsewhere classified: Secondary | ICD-10-CM

## 2024-06-03 DIAGNOSIS — I5033 Acute on chronic diastolic (congestive) heart failure: Secondary | ICD-10-CM | POA: Diagnosis not present

## 2024-06-03 DIAGNOSIS — R6520 Severe sepsis without septic shock: Secondary | ICD-10-CM | POA: Diagnosis not present

## 2024-06-03 HISTORY — PX: THORACENTESIS: SHX235

## 2024-06-03 LAB — LACTATE DEHYDROGENASE, PLEURAL OR PERITONEAL FLUID
LD, Fluid: 55 U/L — ABNORMAL HIGH (ref 3–23)
LD, Fluid: 57 U/L — ABNORMAL HIGH (ref 3–23)

## 2024-06-03 LAB — CBC
HCT: 35.3 % — ABNORMAL LOW (ref 39.0–52.0)
Hemoglobin: 11.7 g/dL — ABNORMAL LOW (ref 13.0–17.0)
MCH: 28.6 pg (ref 26.0–34.0)
MCHC: 33.1 g/dL (ref 30.0–36.0)
MCV: 86.3 fL (ref 80.0–100.0)
Platelets: 212 K/uL (ref 150–400)
RBC: 4.09 MIL/uL — ABNORMAL LOW (ref 4.22–5.81)
RDW: 18.1 % — ABNORMAL HIGH (ref 11.5–15.5)
WBC: 4.8 K/uL (ref 4.0–10.5)
nRBC: 0 % (ref 0.0–0.2)

## 2024-06-03 LAB — GLUCOSE, CAPILLARY
Glucose-Capillary: 131 mg/dL — ABNORMAL HIGH (ref 70–99)
Glucose-Capillary: 164 mg/dL — ABNORMAL HIGH (ref 70–99)
Glucose-Capillary: 144 mg/dL — ABNORMAL HIGH (ref 70–99)
Glucose-Capillary: 162 mg/dL — ABNORMAL HIGH (ref 70–99)

## 2024-06-03 LAB — BODY FLUID CELL COUNT WITH DIFFERENTIAL
Eos, Fluid: 0 %
Eos, Fluid: 0 %
Lymphs, Fluid: 62 %
Lymphs, Fluid: 72 %
Monocyte-Macrophage-Serous Fluid: 13 % — ABNORMAL LOW (ref 50–90)
Monocyte-Macrophage-Serous Fluid: 17 % — ABNORMAL LOW (ref 50–90)
Neutrophil Count, Fluid: 15 % (ref 0–25)
Neutrophil Count, Fluid: 21 % (ref 0–25)
Total Nucleated Cell Count, Fluid: 210 uL (ref 0–1000)
Total Nucleated Cell Count, Fluid: 256 uL (ref 0–1000)

## 2024-06-03 LAB — BASIC METABOLIC PANEL WITH GFR
Anion gap: 8 (ref 5–15)
BUN: 23 mg/dL (ref 8–23)
CO2: 23 mmol/L (ref 22–32)
Calcium: 8.4 mg/dL — ABNORMAL LOW (ref 8.9–10.3)
Chloride: 106 mmol/L (ref 98–111)
Creatinine, Ser: 0.87 mg/dL (ref 0.61–1.24)
GFR, Estimated: >60 mL/min (ref >60)
Glucose, Bld: 145 mg/dL — ABNORMAL HIGH (ref 70–99)
Potassium: 3.8 mmol/L (ref 3.5–5.1)
Sodium: 137 mmol/L (ref 135–145)

## 2024-06-03 LAB — GLUCOSE, PLEURAL OR PERITONEAL FLUID
Glucose, Fluid: 159 mg/dL
Glucose, Fluid: 160 mg/dL

## 2024-06-03 LAB — ALBUMIN, PLEURAL OR PERITONEAL FLUID
Albumin, Fluid: <1.5 g/dL
Albumin, Fluid: <1.5 g/dL

## 2024-06-03 LAB — PROTEIN, PLEURAL OR PERITONEAL FLUID
Total protein, fluid: <3 g/dL
Total protein, fluid: <3 g/dL

## 2024-06-03 LAB — VITAMIN B12: Vitamin B-12: 1939 pg/mL — ABNORMAL HIGH (ref 180–914)

## 2024-06-03 LAB — MAGNESIUM: Magnesium: 1.9 mg/dL (ref 1.7–2.4)

## 2024-06-03 SURGERY — THORACENTESIS
Anesthesia: LOCAL | Laterality: Bilateral

## 2024-06-03 NOTE — Consult Note (Signed)
 WOC Nurse wound follow up Wound type: Unstageable Pressure Injury: left shoulder Unstageable Pressure Injury: left trochanter Unstageable Pressure Injury; right heel Stage 4 Pressure Injury; sacrum  Measurement: see nursing flow sheets Wound bed: Left shoulder, stable eschar continue betadine daily  Left heel; resolving Right heel; macerated periwound and soft; will attempt to dry this out more to hopefully allow eschar to stabilize and fall off eventually. Best practice for heel ulcers Left trochanter; see orthopedics note from bedside debridement today; planning to place NPWT when equipment arrives to the floor (requested machine and canister @1036am ) Drainage (amount, consistency, odor)  Sacrum; yellow Left trochanter; yellow/brown, slight green hue today Left shoulder; none  Right heel; minimal; serousanginous  Periwound:intact Dressing procedure/placement/frequency: 1- Left shoulder Unstageable   Paint with Betadine and leave open to air, daily.   2 - Sacral Stage 4 Pressure Injury  Cleanse with Vashe#[id number]. Apply Aquacel Ag+ (silver hydrofiber) Lawson # [phone] in the wound bed and fill the undermining using a swab, cover with sacral foam dressing, change daily and PRN soilage  3- Right heel; Unstageable; cleanse with saline, cover entire area with silver hydrofiber,(Aquacel ag+) Lawson # [phone]; top with foam. Change daily; offload with pillows or Prevalon boots at all times  4. L heel: Cover with foam dressing, change daily; offload with Prevalon boots    Both feet should be placed in Prevalon boots to offload pressure Soila [phone])   LALM in place for moisture management and pressure redistribution.  Contacted daughter Para Butkiewicz with update on wound care and plans for change to the right heel and left trochanter.   Notified hospitalist of her request for podiatrist for foot/nail care. I have explained that is not a service we provide and podiatrist typically do this  only in the outpatient setting.   WOC Nurse team will follow with you and see patient within 10 days for wound assessments.  Please notify WOC nurses of any acute changes in the wounds or any new areas of concern Laquentin [facility] MSN, RN,CWOCN, CNS, CWON-AP [phone]

## 2024-06-03 NOTE — Procedures (Signed)
 Thoracentesis  Procedure Note  Earl [name]  [id number]  Sep 07, 1941  Date:06/03/24  Time:2:00 PM   Provider Performing:Glennis Borger   Procedure: Left Thoracentesis with imaging guidance ([id number])  Indication(s) Pleural Effusion  Consent Risks of the procedure as well as the alternatives and risks of each were explained to the patient and/or caregiver.  Consent for the procedure was obtained and is signed in the bedside chart  Anesthesia Topical only with 1% lidocaine     Time Out Verified patient identification, verified procedure, site/side was marked, verified correct patient position, special equipment/implants available, medications/allergies/relevant history reviewed, required imaging and test results available.   Sterile Technique Maximal sterile technique including full sterile barrier drape, hand hygiene, sterile gown, sterile gloves, mask, hair covering, sterile ultrasound probe cover (if used).  Procedure Description Ultrasound was used to identify appropriate pleural anatomy for placement and overlying skin marked.  Area of drainage cleaned and draped in sterile fashion. Lidocaine  was used to anesthetize the skin and subcutaneous tissue.  1100 cc's of serosanguinous appearing fluid was drained from the left pleural space. Catheter then removed and bandaid applied to site.   Complications/Tolerance None; patient tolerated the procedure well. Chest X-ray is ordered to confirm no post-procedural complication.   EBL Minimal   Specimen(s) Pleural fluid  [name] MD [facility] See Amion for pager  If no response to pager , please call [phone] until 7pm After 7:00 pm call Elink  [phone] 06/03/2024, 2:01 PM

## 2024-06-03 NOTE — Plan of Care (Signed)
  Problem: Fluid Volume: Goal: Hemodynamic stability will improve Outcome: Progressing   Problem: Clinical Measurements: Goal: Diagnostic test results will improve Outcome: Progressing Goal: Signs and symptoms of infection will decrease Outcome: Progressing   Problem: Respiratory: Goal: Ability to maintain adequate ventilation will improve Outcome: Progressing   Problem: Education: Goal: Knowledge of risk factors and measures for prevention of condition will improve Outcome: Progressing   Problem: Coping: Goal: Psychosocial and spiritual needs will be supported Outcome: Progressing   Problem: Respiratory: Goal: Will maintain a patent airway Outcome: Progressing Goal: Complications related to the disease process, condition or treatment will be avoided or minimized Outcome: Progressing   Problem: Education: Goal: Ability to describe self-care measures that may prevent or decrease complications (Diabetes Survival Skills Education) will improve Outcome: Progressing   Problem: Coping: Goal: Ability to adjust to condition or change in health will improve Outcome: Progressing   Problem: Fluid Volume: Goal: Ability to maintain a balanced intake and output will improve Outcome: Progressing   Problem: Health Behavior/Discharge Planning: Goal: Ability to manage health-related needs will improve Outcome: Progressing   Problem: Metabolic: Goal: Ability to maintain appropriate glucose levels will improve Outcome: Progressing   Problem: Nutritional: Goal: Maintenance of adequate nutrition will improve Outcome: Progressing Goal: Progress toward achieving an optimal weight will improve Outcome: Progressing   Problem: Skin Integrity: Goal: Risk for impaired skin integrity will decrease Outcome: Progressing   Problem: Tissue Perfusion: Goal: Adequacy of tissue perfusion will improve Outcome: Progressing   Problem: Education: Goal: Knowledge of General Education information  will improve Description: Including pain rating scale, medication(s)/side effects and non-pharmacologic comfort measures Outcome: Progressing   Problem: Health Behavior/Discharge Planning: Goal: Ability to manage health-related needs will improve Outcome: Progressing   Problem: Clinical Measurements: Goal: Ability to maintain clinical measurements within normal limits will improve Outcome: Progressing Goal: Will remain free from infection Outcome: Progressing Goal: Diagnostic test results will improve Outcome: Progressing Goal: Respiratory complications will improve Outcome: Progressing Goal: Cardiovascular complication will be avoided Outcome: Progressing   Problem: Activity: Goal: Risk for activity intolerance will decrease Outcome: Progressing   Problem: Nutrition: Goal: Adequate nutrition will be maintained Outcome: Progressing   Problem: Coping: Goal: Level of anxiety will decrease Outcome: Progressing   Problem: Elimination: Goal: Will not experience complications related to bowel motility Outcome: Progressing Goal: Will not experience complications related to urinary retention Outcome: Progressing   Problem: Pain Managment: Goal: General experience of comfort will improve and/or be controlled Outcome: Progressing   Problem: Safety: Goal: Ability to remain free from injury will improve Outcome: Progressing   Problem: Skin Integrity: Goal: Risk for impaired skin integrity will decrease Outcome: Progressing

## 2024-06-03 NOTE — Progress Notes (Signed)
   NAME:  Earl [name], MRN:  [id number], DOB:  13-Nov-1941, LOS: 91 ADMISSION DATE:  03/04/2024, CONSULTATION DATE:  06/02/2024 REFERRING MD:  Dr Madelyne, CHIEF COMPLAINT: Bilateral pleural effusions  History of Present Illness:  Asked to see patient for bilateral pleural effusion History of paroxysmal atrial fibrillation, type 2 diabetes, hypertension, CVA, MGUS, BPH, systolic congestive heart failure, amyloidosis Ongoing generalized weakness, protein calorie malnutrition.  Was admitted from nursing home Status post sacral wound debridement Significant debility  Had an abdominal CT showing bilateral pleural effusion, complaint of shortness of breath  Pertinent  Medical History   Past Medical History:  Diagnosis Date   Adenomatous colon polyp    Allergy    Anal fissure    Arthritis    BPH (benign prostatic hyperplasia)    Diabetes mellitus (HCC)    GERD (gastroesophageal reflux disease)    Hyperlipidemia    Hypertension    Hypothyroidism    Other testicular hypofunction    Stroke (HCC)    Significant Hospital Events: Including procedures, antibiotic start and stop dates in addition to other pertinent events   03/04/2024 Admit CT abdomen-bilateral pleural effusion 05/08/2024-left thoracentesis for 600 hazy amber-colored fluid 06/02/2024- PCCM admit  Interim History / Subjective:   C/O SOB, remains on room air  Objective    Blood pressure 108/74, pulse 78, temperature 97.9 F (36.6 C), resp. rate (!) 7, height 5' 11 (1.803 m), weight 72.6 kg, SpO2 99%.        Intake/Output Summary (Last 24 hours) at 06/03/2024 1355 Last data filed at 06/03/2024 1020 Gross per 24 hour  Intake 253.37 ml  Output 3100 ml  Net -2846.63 ml   Filed Weights   05/30/24 0500 06/02/24 0459 06/03/24 0500  Weight: 72.6 kg 72.6 kg 72.6 kg    Examination: Blood pressure 108/74, pulse 78, temperature 97.9 F (36.6 C), resp. rate (!) 7, height 5' 11 (1.803 m), weight 72.6 kg, SpO2  99%. Gen:      No acute distress, frail, elderly HEENT:  EOMI, sclera anicteric Neck:     No masses; no thyromegaly Lungs:    Clear to auscultation bilaterally; normal respiratory effort CV:         Regular rate and rhythm; no murmurs Abd:      + bowel sounds; soft, non-tender; no palpable masses, no distension Ext:    No edema; adequate peripheral perfusion Neuro: alert and oriented x 3 Psych: normal mood and affect     Resolved problem list   Assessment and Plan   Bilateral pleural effusion -Likely was related to cardiomyopathy, decompensated heart failure - Decompensated diastolic heart failure, mild pulmonary hypertension - Eliquis  is on hold for thoracentesis today. Risk benefit discussed with patient and his daughter.   Sacral decubitus ulcer Sepsis Osteomyelitis left hip - Further evaluation by Ortho 7/14  Paroxysmal atrial fibrillation - On amiodarone  - On Eliquis , on hold  Decompensated diastolic heart failure Cardiac amyloidosis Diastolic heart failure - On diuretics - On tafamidis  and vultrisiran  Left pleural effusion Plan for thoracentesis - Hold Eliquis   Severe protein calorie malnutrition - Optimize caloric intake/proteins  Type 2 diabetes - On Semglee , SSI  Keeghan Mcintire MD [facility] See Amion for pager  If no response to pager , please call [phone] until 7pm After 7:00 pm call Elink  [phone] 06/03/2024, 1:59 PM

## 2024-06-03 NOTE — Progress Notes (Signed)
 Progress Note   Patient: Nishant Wahlen FMW:[id number] DOB: 1941/08/22 DOA: 03/04/2024     91 DOS: the patient was seen and examined on 06/03/2024   Brief hospital course: 83 year old male with history of PAF on Eliquis , IDDM 2, HTN, CVA with right-sided residual weakness, MGUS, BPH, systolic CHF who is here with sacral pain. Of note, he was hospitalized 3/26-4/10 with generalized weakness/acute encephalopathy, hypotension, and was COVID PCR positive. He was eventually discharged to SNF and readmitted 4 days later with pain in the sacral area and shortness of breath. He was found to be in A-fib with RVR, tachypneic and was hypotensive on admission for which critical care was consulted. General surgery was consulted for sacral wound debridement status post bedside excision 4/15 and 03/07/2024.   Assessment and Plan:   Infected Sacral decubitus ulcer, stage IV  Pressure injuries present on admission.   Severe sepsis secondary to pressure ulcers. -She was treated with broad-spectrum antibiotics.  Seen by infectious disease.  Completed antibiotic therapy.   -CT on 6/16 concern for possible osteomyelitis. Surgery consulted and performed wound debridement during this hospitalization.  Plastic surgery was also consulted with recommendation for continued wound care and no surgical management. -Continue current wound care, continue pressure relieving therapies.  -7/11 Wound care reported more sloughing of the left hip wound,  7/13: repeated CT : Similar appearance of osteomyelitis underlying sacral decubitus ulcer. The ulcer itself is less concave in appearance compared to prior. No new focal drainable fluid collection. Soft tissue irregularity overlying the LEFT lateral hip without suspicious subjacent osseous changes. There is mild adjacent fascial enhancement likely reflecting reactive myositis. No focal drainable fluid collection noted. -Continue with wound care, currently, she is afebrile/no  leukocytosis.  -Ortho consulted.  Left trochanteric ulceration with yellow sloughing: Debulked by ortho at the bedside on 7/14, May need wound vac.   Acute on chronic pain: Continue oxycodone  as needed Continue Tylenol  as needed. LFT stable. Report headache when move neck forward, added Lidocaine  patch.    Long standing persistent atrial fibrillation: rate controlled Continue amiodarone , Eliquis .     Acute on chronic combined heart failure Anasarca Continue Tafamidis  and vutrisiran . Continue with lasix    Bilateral pleural effusion: Patient underwent left-sided thoracentesis on 6/18 yielded 600 mL of hazy amber fluid.  Body fluid culture with no growth. CT showed Moderate to large bilateral pleural effusions.  Pulm on board, held eliquis . Plan for sequential thoracentesis.   Anemia of chronic disease Hb stable.  B 12 level is high.    Dysphagia Speech therapy on board. Diet recommendations: Thin liquid; Dysphagia 1 (puree)   Severe protein malnutrition Continue dietician recommendations, encourage diet, supplements.   Diabetes mellitus type 2, now with hypoglycemia. hemoglobin A1C of 8.4%.  Continue Semglee  and SSI.  Decreased dose of insulin  with poor appetite and low blood sugars.     Subjective: She said that her shortness of breath has improved. She was seen by pulm yesterday and plan for thoracentesis to be done today. Denies any active issues/complaints.  Physical Exam: Vitals:   06/03/24 0015 06/03/24 0348 06/03/24 0500 06/03/24 0739  BP: 114/73 115/64  115/72  Pulse: 68 78  76  Resp: 20 20  19   Temp: (!) 97.4 F (36.3 C) 97.6 F (36.4 C)  98.1 F (36.7 C)  TempSrc:      SpO2: 100% 97%  100%  Weight:   72.6 kg   Height:       Constitutional: NAD, calm, comfortable Eyes: PERRL, lids  and conjunctivae normal ENMT: Mucous membranes are moist. Posterior pharynx clear of any exudate or lesions.Normal dentition.  Neck: normal, supple, no masses, no  thyromegaly Respiratory: clear to auscultation bilaterally, no wheezing, no crackles. Normal respiratory effort. No accessory muscle use.  Cardiovascular: Regular rate and rhythm, no murmurs / rubs / gallops. No extremity edema. 2+ pedal pulses. No carotid bruits.  Abdomen: no tenderness, no masses palpated. No hepatosplenomegaly. Bowel sounds positive.  Musculoskeletal: no clubbing / cyanosis. No joint deformity upper and lower extremities. Good ROM, no contractures. Normal muscle tone.  Skin: Sacral decubitus ulcer, left trochanteric ulceration with yellow sloughing Neurologic: CN 2-12 grossly intact. Sensation intact, DTR normal. Strength 5/5 x all 4 extremities.  Psychiatric: Normal judgment and insight. Alert and oriented x 3. Normal mood.   Data Reviewed:  There are no new results to review at this time.  Family Communication: None at the bedside  Disposition: Status is: Inpatient Remains inpatient appropriate because: Deconditioning,sacral decubitus,pleural effusions.  Planned Discharge Destination: Skilled nursing facility    Time spent: 43 minutes  Author: Deliliah Room, MD 06/03/2024 10:28 AM  For on call review [email or web address].

## 2024-06-03 NOTE — Consult Note (Signed)
 Reason for Consult:Left hip wound Referring Physician: Owen Lore Time called: 0730 Time at bedside: 0930   Earl [name] is an 83 y.o. male.  HPI: Jody was admitted 90d ago with sepsis stemming from a sacral decub. He was noted to have a stage I/II left trochanteric pressure ulcer at that time. Since then the hip ulcer has deepened. Wound care has not been able to make any headway in improving the wound, likely 2/2 severe malnutrition. Orthopedic surgery was asked to consult.   Past Medical History:  Diagnosis Date   Adenomatous colon polyp    Allergy    Anal fissure    Arthritis    BPH (benign prostatic hyperplasia)    Diabetes mellitus (HCC)    GERD (gastroesophageal reflux disease)    Hyperlipidemia    Hypertension    Hypothyroidism    Other testicular hypofunction    Stroke [facility])     Past Surgical History:  Procedure Laterality Date   CIRCUMCISION N/A 06/11/2019   Procedure: CIRCUMCISION ADULT;  Surgeon: Nieves Cough, MD;  Location: [facility] ORS;  Service: Urology;  Laterality: N/A;   COLONOSCOPY     INGUINAL HERNIA REPAIR Bilateral    1968 and 2004   IR THORACENTESIS ASP PLEURAL SPACE W/IMG GUIDE  05/08/2024   LOOP RECORDER INSERTION N/A 07/28/2017   Procedure: LOOP RECORDER INSERTION;  Surgeon: Fernande Elspeth BROCKS, MD;  Location: [facility] INVASIVE CV LAB;  Service: Cardiovascular;  Laterality: N/A;   RIGHT/LEFT HEART CATH AND CORONARY ANGIOGRAPHY N/A 10/07/2021   Procedure: RIGHT/LEFT HEART CATH AND CORONARY ANGIOGRAPHY;  Surgeon: Rolan Ezra RAMAN, MD;  Location: [facility] INVASIVE CV LAB;  Service: Cardiovascular;  Laterality: N/A;   TEE WITHOUT CARDIOVERSION N/A 07/28/2017   Procedure: TRANSESOPHAGEAL ECHOCARDIOGRAM (TEE);  Surgeon: Shlomo Wilbert SAUNDERS, MD;  Location: [facility] ENDOSCOPY;  Service: Cardiovascular;  Laterality: N/A;    Family History  Problem Relation Age of Onset   Sickle cell anemia Mother    Diabetes Father    Stomach cancer Father    Colon cancer Father    CAD  Neg Hx    Esophageal cancer Neg Hx    Rectal cancer Neg Hx     Social History:  reports that he has never smoked. He has never used smokeless tobacco. He reports that he does not drink alcohol  and does not use drugs.  Allergies:  Allergies  Allergen Reactions   Penicillins Other (See Comments)    Did it involve swelling of the face/tongue/throat, SOB, or low BP? Unknown Did it involve sudden or severe rash/hives, skin peeling, or any reaction on the inside of your mouth or nose? Unknown Did you need to seek medical attention at a hospital or doctor's office? No When did it last happen?    Over 55 Years Ago   If all above answers are NO, may proceed with cephalosporin use.      Medications: I have reviewed the patient's current medications.  Results for orders placed or performed during the hospital encounter of 03/04/24 (from the past 48 hours)  Glucose, capillary     Status: Abnormal   Collection Time: 06/01/24 12:15 PM  Result Value Ref Range   Glucose-Capillary 154 (H) 70 - 99 mg/dL    Comment: Glucose reference range applies only to samples taken after fasting for at least 8 hours.  Glucose, capillary     Status: Abnormal   Collection Time: 06/01/24  4:37 PM  Result Value Ref Range   Glucose-Capillary 189 (H) 70 - 99  mg/dL    Comment: Glucose reference range applies only to samples taken after fasting for at least 8 hours.  Glucose, capillary     Status: Abnormal   Collection Time: 06/01/24  7:58 PM  Result Value Ref Range   Glucose-Capillary 190 (H) 70 - 99 mg/dL    Comment: Glucose reference range applies only to samples taken after fasting for at least 8 hours.  Magnesium      Status: None   Collection Time: 06/02/24  5:30 AM  Result Value Ref Range   Magnesium  1.8 1.7 - 2.4 mg/dL    Comment: Performed at [facility] Lab, [address] [place], [place] [address]  Phosphorus     Status: None   Collection Time: 06/02/24  5:30 AM  Result Value Ref Range    Phosphorus 2.6 2.5 - 4.6 mg/dL    Comment: Performed at [facility], [address], [place], [place] [address]  CBC with Differential/Platelet     Status: Abnormal   Collection Time: 06/02/24  5:30 AM  Result Value Ref Range   WBC 4.8 4.0 - 10.5 K/uL   RBC 4.26 4.22 - 5.81 MIL/uL   Hemoglobin 12.3 (L) 13.0 - 17.0 g/dL   HCT 62.4 (L) 60.9 - 47.9 %   MCV 88.0 80.0 - 100.0 fL   MCH 28.9 26.0 - 34.0 pg   MCHC 32.8 30.0 - 36.0 g/dL   RDW 81.5 (H) 88.4 - 84.4 %   Platelets 231 150 - 400 K/uL   nRBC 0.0 0.0 - 0.2 %   Neutrophils Relative % 53 %   Neutro Abs 2.6 1.7 - 7.7 K/uL   Lymphocytes Relative 29 %   Lymphs Abs 1.4 0.7 - 4.0 K/uL   Monocytes Relative 11 %   Monocytes Absolute 0.5 0.1 - 1.0 K/uL   Eosinophils Relative 5 %   Eosinophils Absolute 0.2 0.0 - 0.5 K/uL   Basophils Relative 1 %   Basophils Absolute 0.0 0.0 - 0.1 K/uL   Immature Granulocytes 1 %   Abs Immature Granulocytes 0.03 0.00 - 0.07 K/uL    Comment: Performed at [facility], [address] [place], [place] [address]  Comprehensive metabolic panel with GFR     Status: Abnormal   Collection Time: 06/02/24  5:30 AM  Result Value Ref Range   Sodium 137 135 - 145 mmol/L   Potassium 4.3 3.5 - 5.1 mmol/L   Chloride 107 98 - 111 mmol/L   CO2 17 (L) 22 - 32 mmol/L   Glucose, Bld 172 (H) 70 - 99 mg/dL    Comment: Glucose reference range applies only to samples taken after fasting for at least 8 hours.   BUN 21 8 - 23 mg/dL   Creatinine, Ser 9.09 0.61 - 1.24 mg/dL   Calcium  8.7 (L) 8.9 - 10.3 mg/dL   Total Protein 5.7 (L) 6.5 - 8.1 g/dL   Albumin  2.7 (L) 3.5 - 5.0 g/dL   AST 47 (H) 15 - 41 U/L   ALT 57 (H) 0 - 44 U/L   Alkaline Phosphatase 116 38 - 126 U/L   Total Bilirubin 0.9 0.0 - 1.2 mg/dL   GFR, Estimated >39 >39 mL/min    Comment: (NOTE) Calculated using the CKD-EPI Creatinine Equation (2021)    Anion gap 13 5 - 15    Comment: Performed at [facility] Lab, [address] [place],  [place] [address]  Glucose, capillary     Status:  Abnormal   Collection Time: 06/02/24  8:39 AM  Result Value Ref Range   Glucose-Capillary 157 (H) 70 - 99 mg/dL    Comment: Glucose reference range applies only to samples taken after fasting for at least 8 hours.  Glucose, capillary     Status: Abnormal   Collection Time: 06/02/24 11:30 AM  Result Value Ref Range   Glucose-Capillary 168 (H) 70 - 99 mg/dL    Comment: Glucose reference range applies only to samples taken after fasting for at least 8 hours.  Glucose, capillary     Status: Abnormal   Collection Time: 06/02/24  4:42 PM  Result Value Ref Range   Glucose-Capillary 229 (H) 70 - 99 mg/dL    Comment: Glucose reference range applies only to samples taken after fasting for at least 8 hours.  Glucose, capillary     Status: Abnormal   Collection Time: 06/02/24  8:25 PM  Result Value Ref Range   Glucose-Capillary 213 (H) 70 - 99 mg/dL    Comment: Glucose reference range applies only to samples taken after fasting for at least 8 hours.  Magnesium      Status: None   Collection Time: 06/03/24  4:33 AM  Result Value Ref Range   Magnesium  1.9 1.7 - 2.4 mg/dL    Comment: Performed at [facility] Lab, [address], [place], [place] [address]  Vitamin B12     Status: Abnormal   Collection Time: 06/03/24  4:33 AM  Result Value Ref Range   Vitamin B-12 1,939 (H) 180 - 914 pg/mL    Comment: (NOTE) This assay is not validated for testing neonatal or myeloproliferative syndrome specimens for Vitamin B12 levels. Performed at [facility], [address] [place], [place] [address]   CBC     Status: Abnormal   Collection Time: 06/03/24  4:33 AM  Result Value Ref Range   WBC 4.8 4.0 - 10.5 K/uL   RBC 4.09 (L) 4.22 - 5.81 MIL/uL   Hemoglobin 11.7 (L) 13.0 - 17.0 g/dL   HCT 64.6 (L) 60.9 - 47.9 %   MCV 86.3 80.0 - 100.0 fL   MCH 28.6 26.0 - 34.0 pg   MCHC 33.1 30.0 - 36.0 g/dL   RDW 81.8 (H) 88.4 - 84.4 %   Platelets 212 150 - 400  K/uL   nRBC 0.0 0.0 - 0.2 %    Comment: Performed at [facility], [address] [place], [place] [address]  Basic metabolic panel     Status: Abnormal   Collection Time: 06/03/24  4:33 AM  Result Value Ref Range   Sodium 137 135 - 145 mmol/L   Potassium 3.8 3.5 - 5.1 mmol/L   Chloride 106 98 - 111 mmol/L   CO2 23 22 - 32 mmol/L   Glucose, Bld 145 (H) 70 - 99 mg/dL    Comment: Glucose reference range applies only to samples taken after fasting for at least 8 hours.   BUN 23 8 - 23 mg/dL   Creatinine, Ser 9.12 0.61 - 1.24 mg/dL   Calcium  8.4 (L) 8.9 - 10.3 mg/dL   GFR, Estimated >39 >39 mL/min    Comment: (NOTE) Calculated using the CKD-EPI Creatinine Equation (2021)    Anion gap 8 5 - 15    Comment: Performed at [facility], [address] [place], [place] [address]  Glucose, capillary     Status: Abnormal   Collection Time: 06/03/24  7:38 AM  Result Value Ref Range   Glucose-Capillary 131 (H) 70 - 99 mg/dL    Comment: Glucose reference range applies only to samples taken after fasting for at least 8 hours.   *Note: Due to a large number of results and/or encounters for the requested time period, some results have not been displayed. A complete set of results can be found in Results Review.    CT ABDOMEN PELVIS W CONTRAST Result Date: 06/02/2024 CLINICAL DATA:  Abdominal abscess (Ped 0-17y) sacral and left hip ulcer evaluation EXAM: CT ABDOMEN AND PELVIS WITH CONTRAST TECHNIQUE: Multidetector CT imaging of the abdomen and pelvis was performed using the standard protocol following bolus administration of intravenous contrast. RADIATION DOSE REDUCTION: This exam was performed according to the departmental dose-optimization program which includes automated exposure control, adjustment of the mA and/or kV according to patient size and/or use of iterative reconstruction technique. CONTRAST:  75mL OMNIPAQUE  IOHEXOL  350 MG/ML SOLN COMPARISON:  May 08, 2024, March 04, 2024  FINDINGS: Lower chest: Moderate to large bilateral pleural effusions. Bibasilar enhancing opacities most consistent with atelectasis. Cardiomegaly. Hepatobiliary: Prominence of the gallbladder wall, favored due to fluid overloaded state. No discrete focal hepatic abnormality within the limitations of an arterial exam. Pancreas: There is a hypodense cystic appearing mass in the pancreatic body which measures 14 mm (series 3, image 26). Spleen: Unremarkable for phase of contrast. Adrenals/Urinary Tract: Adrenal glands are unremarkable. Kidneys enhance symmetrically. No hydronephrosis. No obstructing nephrolithiasis. Bladder is decompressed with trabeculated appearing wall thickening Stomach/Bowel: No evidence of bowel obstruction. Small rectal stool ball. No evidence of appendicitis. Stomach is decompressed. Vascular/Lymphatic: Atherosclerotic calcifications of the nonaneurysmal abdominal aorta. Reproductive: Prostate is present. Other: Anasarca. Soft tissue irregularity overlying the LEFT lateral hip without suspicious subjacent osseous changes. There is mild adjacent fascial enhancement likely reflecting reactive myositis. No focal drainable fluid collection noted. Musculoskeletal: Soft tissue deformity overlying the sacrum with underlying osseous erosion of the posterior elements of the sacrum. Extent of erosion appears similar compared to most recent prior. No new focal drainable fluid collection. Soft tissue deformity LEFT concave compared to prior. Moderate compression fracture deformity of L2, similar compared to priors. Similar lucent lesions of the RIGHT iliac bone adjacent to the sacroiliac joint. Osteopenia. IMPRESSION: 1. Similar appearance of osteomyelitis underlying sacral decubitus ulcer. The ulcer itself is less concave in appearance compared to prior. No new focal drainable fluid collection. 2. Soft tissue irregularity overlying the LEFT lateral hip without suspicious subjacent osseous changes. There  is mild adjacent fascial enhancement likely reflecting reactive myositis. No focal drainable fluid collection noted. 3. Moderate to large bilateral pleural effusions. 4. There is a 14 mm hypodense cystic appearing mass in the pancreatic body. 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 Recommend follow-up MRI/MRCP in 2 years if clinically indicated. 5. Bladder is decompressed with trabeculated appearing wall thickening. This could reflect sequela of chronic outlet obstruction. Recommend correlation with urinalysis. 6. Anasarca Aortic Atherosclerosis (ICD10-I70.0). Electronically Signed   By: Corean Salter M.D.   On: 06/02/2024 12:55    Review of Systems  Unable to perform ROS: Other   Blood pressure 115/72, pulse 76, temperature 98.1 F (36.7 C), resp. rate 19, height 5' 11 (1.803 m), weight 72.6 kg, SpO2 100%. Physical Exam Constitutional:      General: He is not in acute distress.    Appearance: He is well-developed. He is not diaphoretic.  HENT:     Head: Normocephalic and atraumatic.  Eyes:     General: No  scleral icterus.       Right eye: No discharge.        Left eye: No discharge.     Conjunctiva/sclera: Conjunctivae normal.  Cardiovascular:     Rate and Rhythm: Normal rate and regular rhythm.  Pulmonary:     Effort: Pulmonary effort is normal. No respiratory distress.  Musculoskeletal:     Cervical back: Normal range of motion.     Comments: LLE No traumatic wounds, ecchymosis, or rash  Ulceration over left trochanter with bulky yellow slough, NT, no fluctuance, no odor   Skin:    General: Skin is warm and dry.  Neurological:     Mental Status: He is alert.  Psychiatric:        Mood and Affect: Mood normal.        Behavior: Behavior normal.     Assessment/Plan: Left hip wound -- I debulked what slough I could. As nothing else has made much difference am inclined to try VAC therapy but may not get much improvement unless nutrition optimized.    Ozell DOROTHA Ned,  PA-C Orthopedic Surgery [phone] 06/03/2024, 9:42 AM

## 2024-06-03 NOTE — Procedures (Signed)
 Thoracentesis  Procedure Note  Earl [name]  [id number]  01/03/1941  Date:06/03/24  Time:2:01 PM   Provider Performing:Ragen Laver   Procedure: Right Thoracentesis with imaging guidance ([id number])  Indication(s) Pleural Effusion  Consent Risks of the procedure as well as the alternatives and risks of each were explained to the patient and/or caregiver.  Consent for the procedure was obtained and is signed in the bedside chart  Anesthesia Topical only with 1% lidocaine     Time Out Verified patient identification, verified procedure, site/side was marked, verified correct patient position, special equipment/implants available, medications/allergies/relevant history reviewed, required imaging and test results available.   Sterile Technique Maximal sterile technique including full sterile barrier drape, hand hygiene, sterile gown, sterile gloves, mask, hair covering, sterile ultrasound probe cover (if used).  Procedure Description Ultrasound was used to identify appropriate pleural anatomy for placement and overlying skin marked.  Area of drainage cleaned and draped in sterile fashion. Lidocaine  was used to anesthetize the skin and subcutaneous tissue.  1200 cc's of serosanguinous appearing fluid was drained from the right pleural space. Catheter then removed and bandaid applied to site.   Complications/Tolerance None; patient tolerated the procedure well. Chest X-ray is ordered to confirm no post-procedural complication.   EBL Minimal   Specimen(s) Pleural fluid  [name] MD [facility] See Amion for pager  If no response to pager , please call [phone] until 7pm After 7:00 pm call Elink  [phone] 06/03/2024, 2:02 PM

## 2024-06-04 ENCOUNTER — Inpatient Hospital Stay (HOSPITAL_COMMUNITY)

## 2024-06-04 DIAGNOSIS — R6520 Severe sepsis without septic shock: Secondary | ICD-10-CM | POA: Diagnosis not present

## 2024-06-04 DIAGNOSIS — A419 Sepsis, unspecified organism: Secondary | ICD-10-CM | POA: Diagnosis not present

## 2024-06-04 LAB — GLUCOSE, CAPILLARY
Glucose-Capillary: 95 mg/dL (ref 70–99)
Glucose-Capillary: 130 mg/dL — ABNORMAL HIGH (ref 70–99)
Glucose-Capillary: 192 mg/dL — ABNORMAL HIGH (ref 70–99)

## 2024-06-04 LAB — LACTATE DEHYDROGENASE: LDH: 166 U/L (ref 98–192)

## 2024-06-04 MED ORDER — APIXABAN 5 MG PO TABS
5.0000 mg | ORAL_TABLET | Freq: Two times a day (BID) | ORAL | Status: DC
  Administered 2024-06-04 – 2024-08-21 (×163): 5 mg via ORAL
  Filled 2024-06-04 (×9): qty 1
  Filled 2024-06-04: qty 2
  Filled 2024-06-04 (×27): qty 1
  Filled 2024-06-04: qty 2
  Filled 2024-06-04 (×74): qty 1
  Filled 2024-06-04: qty 2
  Filled 2024-06-04 (×46): qty 1

## 2024-06-04 NOTE — Progress Notes (Signed)
 Ok to resume apixaban  5mg  BID post thoracentesis 7/14 per Dr. Dino.  Sergio Batch, PharmD, BCIDP, AAHIVP, CPP Infectious Disease Pharmacist 06/04/2024 1:13 PM

## 2024-06-04 NOTE — Progress Notes (Signed)
 Progress Note   Patient: Earl [name] FMW:[id number] DOB: 04-20-1941 DOA: 03/04/2024     92 DOS: the patient was seen and examined on 06/04/2024   Brief hospital course: 83 year old male with history of PAF on Eliquis , IDDM 2, HTN, CVA with right-sided residual weakness, MGUS, BPH, systolic CHF who is here with sacral pain. Of note, he was hospitalized 3/26-4/10 with generalized weakness/acute encephalopathy, hypotension, and was COVID PCR positive. He was eventually discharged to SNF and readmitted 4 days later with pain in the sacral area and shortness of breath. He was found to be in A-fib with RVR, tachypneic and was hypotensive on admission for which critical care was consulted. General surgery was consulted for sacral wound debridement status post bedside excision 4/15 and 03/07/2024.   Assessment and Plan:   Infected Sacral decubitus ulcer, stage IV  Pressure injuries present on admission.   Severe sepsis secondary to pressure ulcers. -She was treated with broad-spectrum antibiotics.  Seen by infectious disease.  Completed antibiotic therapy.   -CT on 6/16 concern for possible osteomyelitis. Surgery consulted and performed wound debridement during this hospitalization.  Plastic surgery was also consulted with recommendation for continued wound care and no surgical management. -Continue current wound care, continue pressure relieving therapies.  -7/11 Wound care reported more sloughing of the left hip wound,  -7/13: repeated CT : Similar appearance of osteomyelitis underlying sacral decubitus ulcer. The ulcer itself is less concave in appearance compared to prior. No new focal drainable fluid collection. Soft tissue irregularity overlying the LEFT lateral hip without suspicious subjacent osseous changes. There is mild adjacent fascial enhancement likely reflecting reactive myositis. No focal drainable fluid collection noted. -Continue with wound care, currently, she is afebrile/no  leukocytosis.  -Ortho consulted.  Left trochanteric ulceration with yellow sloughing: Debulked by ortho at the bedside on 7/14, wound care on board.Skin protected to the hip with VAC drape for foam bridge    Acute on chronic pain: Continue oxycodone  as needed Continue Tylenol  as needed. LFT stable. Report headache when move neck forward, added Lidocaine  patch.    Long standing persistent atrial fibrillation: rate controlled Continue amiodarone , Eliquis .     Acute on chronic combined heart failure Anasarca Continue Tafamidis  and vutrisiran . Continue with lasix    Bilateral pleural effusion: Patient underwent left-sided thoracentesis on 6/18 yielded 600 mL of hazy amber fluid.  Body fluid culture with no growth. S/p right thoracentesis done on 7/14 with removal of 1200 cc of serosanguinous fluid.. CT showed Moderate to large bilateral pleural effusions.  Pulm on board, held eliquis .    Anemia of chronic disease Hb stable.  B 12 level is high.    Dysphagia Speech therapy on board. Diet recommendations: Thin liquid; Dysphagia 1 (puree)   Severe protein malnutrition Continue dietician recommendations, encourage diet, supplements.   Diabetes mellitus type 2, now with hypoglycemia. hemoglobin A1C of 8.4%.  Continue Semglee  and SSI.  Decreased dose of insulin  with poor appetite and low blood sugars.     Subjective: She is complaining of right wrist pain and stiffness. Shortness of breath has resolved s/p right thoracentesis done yesterday.  Physical Exam: Vitals:   06/04/24 0042 06/04/24 0412 06/04/24 0600 06/04/24 0739  BP: 101/66 (!) 97/59  113/75  Pulse: 71 73  72  Resp: 17 19 16    Temp: 98.1 F (36.7 C) 98.2 F (36.8 C)  98 F (36.7 C)  TempSrc:      SpO2: 100% 99%  98%  Weight:      Height:  Constitutional: NAD, calm, comfortable Eyes: PERRL, lids and conjunctivae normal ENMT: Mucous membranes are moist. Posterior pharynx clear of any exudate or  lesions.Normal dentition.  Neck: normal, supple, no masses, no thyromegaly Respiratory: clear to auscultation bilaterally, no wheezing, no crackles. Normal respiratory effort. No accessory muscle use.  Cardiovascular: Regular rate and rhythm, no murmurs / rubs / gallops. No extremity edema. 2+ pedal pulses. No carotid bruits.  Abdomen: no tenderness, no masses palpated. No hepatosplenomegaly. Bowel sounds positive.  Musculoskeletal: no clubbing / cyanosis.Slight stiffness and tenderness to the right wrist joint. Skin: Sacral decubitus ulcer, left trochanteric dressing in place Neurologic: CN 2-12 grossly intact. Sensation intact, DTR normal. Strength 5/5 x all 4 extremities.  Psychiatric: Normal judgment and insight. Alert and oriented x 3. Normal mood.   Data Reviewed:  There are no new results to review at this time.  Family Communication: None at the bedside  Disposition: Status is: Inpatient Remains inpatient appropriate because: Deconditioning,sacral decubitus,pleural effusions.  Planned Discharge Destination: Skilled nursing facility    Time spent: 41 minutes  Author: Deliliah Room, MD 06/04/2024 9:43 AM  For on call review [email or web address].

## 2024-06-04 NOTE — Progress Notes (Signed)
 NAME:  Earl [name], MRN:  [id number], DOB:  1941-05-02, LOS: 92 ADMISSION DATE:  03/04/2024, CONSULTATION DATE:  06/02/2024 REFERRING MD:  Dr Madelyne, CHIEF COMPLAINT: Bilateral pleural effusions  History of Present Illness:  Asked to see patient for bilateral pleural effusion History of paroxysmal atrial fibrillation, type 2 diabetes, hypertension, CVA, MGUS, BPH, systolic congestive heart failure, amyloidosis Ongoing generalized weakness, protein calorie malnutrition.  Was admitted from nursing home Status post sacral wound debridement Significant debility  Had an abdominal CT showing bilateral pleural effusion, complaint of shortness of breath  Pertinent  Medical History   Past Medical History:  Diagnosis Date   Adenomatous colon polyp    Allergy    Anal fissure    Arthritis    BPH (benign prostatic hyperplasia)    Diabetes mellitus (HCC)    GERD (gastroesophageal reflux disease)    Hyperlipidemia    Hypertension    Hypothyroidism    Other testicular hypofunction    Stroke (HCC)    Significant Hospital Events: Including procedures, antibiotic start and stop dates in addition to other pertinent events   03/04/2024 Admit CT abdomen-bilateral pleural effusion 05/08/2024-left thoracentesis for 600 hazy amber-colored fluid 06/02/2024- PCCM consult 7/14 bilateral thoracentesis with removal of 1.1 L on the left and 1.2 L on right of serosanguineous fluid  Interim History / Subjective:   Breathing is better postthoracentesis  Objective    Blood pressure 113/75, pulse 72, temperature 98 F (36.7 C), resp. rate 16, height 5' 11 (1.803 m), weight 72.6 kg, SpO2 98%.        Intake/Output Summary (Last 24 hours) at 06/04/2024 1241 Last data filed at 06/04/2024 1236 Gross per 24 hour  Intake 473 ml  Output 800 ml  Net -327 ml   Filed Weights   05/30/24 0500 06/02/24 0459 06/03/24 0500  Weight: 72.6 kg 72.6 kg 72.6 kg    Examination: Blood pressure 108/74, pulse  78, temperature 97.9 F (36.6 C), resp. rate (!) 7, height 5' 11 (1.803 m), weight 72.6 kg, SpO2 99%. Blood pressure 113/75, pulse 72, temperature 98 F (36.7 C), resp. rate 16, height 5' 11 (1.803 m), weight 72.6 kg, SpO2 98%. Gen:      No acute distress HEENT:  EOMI, sclera anicteric Neck:     No masses; no thyromegaly Lungs:    Clear to auscultation bilaterally; normal respiratory effort CV:         Regular rate and rhythm; no murmurs Abd:      + bowel sounds; soft, non-tender; no palpable masses, no distension Ext:    No edema; adequate peripheral perfusion Neuro: alert and oriented x 3 Psych: normal mood and affect    Resolved problem list   Assessment and Plan   Bilateral pleural effusion, transudative -Likely  related to cardiomyopathy, decompensated heart failure, third spacing of fluids due to poor nutrition - Continue nutrition optimization, management of heart failure - Follow final pleural fluid results and cytology - Okay to resume Eliquis  that was held for procedure   Sacral decubitus ulcer Sepsis Osteomyelitis left hip - Further evaluation by Ortho 7/14  Paroxysmal atrial fibrillation - On amiodarone  - On Eliquis , on hold  Decompensated diastolic heart failure Cardiac amyloidosis Diastolic heart failure - On diuretics - On tafamidis  and vultrisiran  Severe protein calorie malnutrition - Optimize caloric intake/proteins  Type 2 diabetes - On Semglee , SSI  PCCM will be available as needed.  Please call with any questions.  [name] MD [facility] See Amion for pager  If no response to pager , please call [phone] until 7pm After 7:00 pm call Elink  [phone] 06/04/2024, 12:41 PM

## 2024-06-04 NOTE — Consult Note (Signed)
 WOC Nurse Consult Note: Reason for Consult: Applied peel and place VAC on Left hip. Wound type: Pressure injury stage 4. Pressure Injury POA: No Measurement: 4.5 cm x 4 cm x 3 cm Wound bed: 100% yellow slough and fat tissue. Drainage (amount, consistency, odor) Minimum amount, green. Periwound: intact. Dressing procedure/placement/frequency: Removed old NPWT dressing Cleansed wound with normal saline Periwound skin protected with skin barrier wipe or window framed with drape Skin protected to the hip with VAC drape for foam bridge  Filled wound with 1 piece of black foam peel and place. Sealed NPWT dressing at HG/155mmHG  Patient tolerated procedure well WOC nurse will continue to provide NPWT dressing changed.  WOC team will follow weekly and change the dressing on next TUE.  Please reconsult if further assistance is needed. Thank-you,  Lela Holm BSN, CNS, RN, [employer], WOCN  (Phone [phone])

## 2024-06-05 DIAGNOSIS — R6520 Severe sepsis without septic shock: Secondary | ICD-10-CM | POA: Diagnosis not present

## 2024-06-05 DIAGNOSIS — A419 Sepsis, unspecified organism: Secondary | ICD-10-CM | POA: Diagnosis not present

## 2024-06-05 LAB — GLUCOSE, CAPILLARY
Glucose-Capillary: 145 mg/dL — ABNORMAL HIGH (ref 70–99)
Glucose-Capillary: 224 mg/dL — ABNORMAL HIGH (ref 70–99)
Glucose-Capillary: 143 mg/dL — ABNORMAL HIGH (ref 70–99)
Glucose-Capillary: 146 mg/dL — ABNORMAL HIGH (ref 70–99)

## 2024-06-05 LAB — CYTOLOGY - NON PAP

## 2024-06-05 NOTE — Progress Notes (Signed)
 Speech Language Pathology Treatment: Cognitive-Linguistic  Patient Details Name: Earl [name] MRN: [id number] DOB: 1941-06-25 Today's Date: 06/05/2024 Time: 1152-1205 SLP Time Calculation (min) (ACUTE ONLY): 13 min  Assessment / Plan / Recommendation Clinical Impression  Pt seen for SLP session to target speech/voice goals with upper respiratory strength training exercises. Pt completed EMST x20 with resistance upped to easy/moderate level (green/yellow). He completed IS exercises x20 with a range from 500-771mL. Some subjective improvements noted in strength and clarity of pt's voice. Pt reported completing respiratory exercises independently, though frequency and amount is unclear. SLP will continue to follow approximately x1 per week to address goals and add speech/voice exercsies as indicated. Continue SLP PoC.    HPI HPI: Patient is an 83 y.o. 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 He was discharged from the hospital (admitted for acute metabolic encephalopathy, hypotensive, hypoglycemic) to [facility] for rehab. He presented back to the hospital four days later on 03/04/24 with SOB and pain in sacral area. 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 SLP previous saw pt to assess and manage concerns for dysphagia. SLP re-consulted on 05/02/24 for voice assessment.      SLP Plan  Continue with current plan of care          Recommendations                  Oral care BID   Frequent or constant Supervision/Assistance Aphonia (R49.1);Dysarthria and anarthria (R47.1)     Continue with current plan of care     Earl [name]  06/05/2024, 12:18 PM

## 2024-06-05 NOTE — Progress Notes (Signed)
 Physical Therapy Treatment Patient Details Name: Earl [name] MRN: [id number] DOB: [birth year] Today's Date: 06/05/2024   History of Present Illness 83 y.o. male admitted from [facility] SNF rehab on 03/04/24 with SOB, sacral pain, Afib with RVR. Pt with acute metabolic encephalopathy, rhabdomyolysis and sepsis secondary to sacral wound infection. 4/15 & 4/17 bedside sacral wound debridement. Pt also with B pleural effusion, 6/18 L thoracentesis, 7/14 L & R thoracentesis.  PMhx: adm 3/26-4/08/2024 with fall, AMS, COVID; d/c to SNF rehab. CVA (2021, residual R-side weakness), T2DM, HTN, CHF, MGUS, Afib on Eliquis .    PT Comments  Pt in bed upon arrival and agreeable to PT/OT treatment to progress mobility. Focused session on dynamic seated balance and LE strength. Pt had difficulty maintaining upright and midline position when seated at EOB. CGA-ModA required with frequent cues as pt tended to lean to the left and posteriorly. Increased difficulty with tasks requiring pt to lean anteriorly. Performed multiple sit<>stand repetitions with ModAx2 from EOB height and ModAx1-ModAx2 from elevated stedy flaps. Pt was able to perform x2 stands for ~15 seconds each for TotalA pericare. Continue to recommend <3hrs post acute rehab to work on improving mobility and decreasing caregiver burden. Acute PT to follow.   80-90s BPM during session   If plan is discharge home, recommend the following: Two people to help with walking and/or transfers;Two people to help with bathing/dressing/bathroom;Assistance with feeding;Assist for transportation   Can travel by private vehicle     No  Equipment Recommendations  Hospital bed;Hoyer lift;Wheelchair cushion (measurements PT);Wheelchair (measurements PT) (air mattress, roho cushion)       Precautions / Restrictions Precautions Precautions: Fall Recall of Precautions/Restrictions: Impaired Precaution/Restrictions Comments: sacral wound; L hip wound, bilat heel  wounds Restrictions Weight Bearing Restrictions Per Provider Order: No     Mobility  Bed Mobility Overal bed mobility: Needs Assistance Bed Mobility: Supine to Sit, Sit to Supine    Supine to sit: Mod assist, +2 for physical assistance, +2 for safety/equipment Sit to supine: Mod assist, +2 for physical assistance, +2 for safety/equipment   General bed mobility comments: ModAx2 to complete moving LE's off EOB and raise trunk. ModAx2 for return to supine to control trunk descent and raise LE's onto bed    Transfers Overall transfer level: Needs assistance Equipment used: Ambulation equipment used Transfers: Sit to/from Stand Sit to Stand: Mod assist, +2 safety/equipment, +2 physical assistance, From elevated surface    General transfer comment: ModAx2 for boost-up and steadying from EOB. Assist to lean forwards to reach for stedy bar handle and for hip extension. ModA to ModAx2 from elevated flaps Transfer via Lift Equipment: Stedy     Balance Overall balance assessment: Needs assistance Sitting-balance support: Feet supported Sitting balance-Leahy Scale: Poor Sitting balance - Comments: left lateral and posterior lean requiring CGA-ModA. Frequent cues to achieve midline position Postural control: Left lateral lean, Posterior lean Standing balance support: Bilateral upper extremity supported Standing balance-Leahy Scale: Zero Standing balance comment: stood with 2 assist in [place]     Communication Communication Communication: Impaired Factors Affecting Communication: Reduced clarity of speech  Cognition Arousal: Alert Behavior During Therapy: WFL for tasks assessed/performed   PT - Cognitive impairments: Problem solving, Safety/Judgement    Following commands: Impaired Following commands impaired: Follows one step commands with increased time    Cueing Cueing Techniques: Verbal cues, Gestural cues  Exercises Other Exercises Other Exercises: work on mid-line position  with MinA/ModA and frequent cueing Other Exercises: Lateral leans onto propped elbow, Bx5  Other Exercises: Dynamic reaching with BUEs, MinA/CGA for anterior lean        Pertinent Vitals/Pain Pain Assessment Pain Assessment: No/denies pain     PT Goals (current goals can now be found in the care plan section) Acute Rehab PT Goals PT Goal Formulation: With patient/family Time For Goal Achievement: 06/11/24 Potential to Achieve Goals: Fair Progress towards PT goals: Progressing toward goals    Frequency    Min 1X/week       AM-PAC PT 6 Clicks Mobility   Outcome Measure  Help needed turning from your back to your side while in a flat bed without using bedrails?: Total Help needed moving from lying on your back to sitting on the side of a flat bed without using bedrails?: Total Help needed moving to and from a bed to a chair (including a wheelchair)?: Total Help needed standing up from a chair using your arms (e.g., wheelchair or bedside chair)?: Total Help needed to walk in hospital room?: Total Help needed climbing 3-5 steps with a railing? : Total 6 Click Score: 6    End of Session Equipment Utilized During Treatment: Gait belt Activity Tolerance: Patient tolerated treatment well Patient left: in bed;with call bell/phone within reach;with family/visitor present Nurse Communication: Mobility status PT Visit Diagnosis: Muscle weakness (generalized) (M62.81);Adult, failure to thrive (R62.7);Other abnormalities of gait and mobility (R26.89);Unsteadiness on feet (R26.81);Difficulty in walking, not elsewhere classified (R26.2)     Time: 8466-8387 PT Time Calculation (min) (ACUTE ONLY): 39 min  Charges:    $Therapeutic Activity: 8-22 mins PT General Charges $$ ACUTE PT VISIT: 1 Visit                    Kate ORN, PT, DPT Secure Chat Preferred  Rehab Office [phone]  [name] 06/05/2024, 5:13 PM

## 2024-06-05 NOTE — Progress Notes (Signed)
 Progress Note   Patient: Earl [name] FMW:[id number] DOB: 29-Apr-1941 DOA: 03/04/2024     93 DOS: the patient was seen and examined on 06/05/2024   Brief hospital course: 83 year old male with history of PAF on Eliquis , IDDM 2, HTN, CVA with right-sided residual weakness, MGUS, BPH, systolic CHF who is here with sacral pain. Of note, he was hospitalized 3/26-4/10 with generalized weakness/acute encephalopathy, hypotension, and was COVID PCR positive. He was eventually discharged to SNF and readmitted 4 days later with pain in the sacral area and shortness of breath. He was found to be in A-fib with RVR, tachypneic and was hypotensive on admission for which critical care was consulted. General surgery was consulted for sacral wound debridement status post bedside excision 4/15 and 03/07/2024.   Assessment and Plan:   Infected Sacral decubitus ulcer, stage IV  Pressure injuries present on admission.   Severe sepsis secondary to pressure ulcers. -She was treated with broad-spectrum antibiotics.  Seen by infectious disease.  Completed antibiotic therapy.   -CT on 6/16 concern for possible osteomyelitis. Surgery consulted and performed wound debridement during this hospitalization.  Plastic surgery was also consulted with recommendation for continued wound care and no surgical management. -Continue current wound care, continue pressure relieving therapies.  -7/11 Wound care reported more sloughing of the left hip wound,  -7/13: repeated CT : Similar appearance of osteomyelitis underlying sacral decubitus ulcer. The ulcer itself is less concave in appearance compared to prior. No new focal drainable fluid collection. Soft tissue irregularity overlying the LEFT lateral hip without suspicious subjacent osseous changes. There is mild adjacent fascial enhancement likely reflecting reactive myositis. No focal drainable fluid collection noted. -Continue with wound care, currently, she is afebrile/no  leukocytosis.  -Ortho consulted.  Left trochanteric ulceration with yellow sloughing: Debulked by ortho at the bedside on 7/14, wound care on board.Skin protected to the hip with VAC drape for foam bridge    Acute on chronic pain: Continue oxycodone  as needed Continue Tylenol  as needed. LFT stable. Continue with Lidocaine  patch.    Long standing persistent atrial fibrillation: rate controlled Continue amiodarone , Eliquis .     Acute on chronic combined heart failure Anasarca Continue Tafamidis  and vutrisiran . Continue with lasix   Right wrist pain: No evidence of fractures on wrist xray that was done on 7/15. She does have degenerative changes there.   Bilateral pleural effusion: Transudative Patient underwent left-sided thoracentesis on 6/18 yielded 600 mL of hazy amber fluid.  Body fluid culture with no growth. S/p b/l thoracentesis done on 7/14 by pulmonologist. It is Transudative so likely related to cardiomyopathy and poor nutritional status. Pulm on board    Anemia of chronic disease Hb stable.  B 12 level is high.    Dysphagia Speech therapy on board. Diet recommendations: Thin liquid; Dysphagia 1 (puree)   Severe protein malnutrition Continue dietician recommendations, encourage diet, supplements.   Diabetes mellitus type 2, now with hypoglycemia. hemoglobin A1C of 8.4%.  Continue Semglee  and SSI.  Decreased dose of insulin  with poor appetite and low blood sugars.  DVT Prophylaxis: On eliquis      Subjective: No acute events overnight. Still complaining of slight right wrist stiffness. I discussed her the results of the wrist xray. Denies shortness of breath or chest pain.  Physical Exam: Vitals:   06/04/24 2356 06/05/24 0433 06/05/24 0435 06/05/24 0745  BP: 109/63 (!) 102/57 (!) 102/57 106/61  Pulse: 75 74 74 80  Resp: 18 19 (!) 21   Temp: 98.2 F (36.8 C) 98.8  F (37.1 C) 98.8 F (37.1 C) 98.3 F (36.8 C)  TempSrc:      SpO2: 100% 100% 100% 99%   Weight:      Height:       Constitutional: NAD, calm, comfortable Eyes: PERRL, lids and conjunctivae normal ENMT: Mucous membranes are moist. Posterior pharynx clear of any exudate or lesions.Normal dentition.  Neck: normal, supple, no masses, no thyromegaly Respiratory: clear to auscultation bilaterally, no wheezing, no crackles. Normal respiratory effort. No accessory muscle use.  Cardiovascular: Regular rate and rhythm, no murmurs / rubs / gallops. No extremity edema. 2+ pedal pulses. No carotid bruits.  Abdomen: no tenderness, no masses palpated. No hepatosplenomegaly. Bowel sounds positive.  Musculoskeletal: no clubbing / cyanosis.Slight stiffness and tenderness to the right wrist joint. Skin: Sacral decubitus ulcer, left trochanteric dressing in place Neurologic: CN 2-12 grossly intact. Sensation intact, DTR normal. Strength 5/5 x all 4 extremities.  Psychiatric: Normal judgment and insight. Alert and oriented x 3. Normal mood.   Data Reviewed:  There are no new results to review at this time.  Family Communication: None at the bedside  Disposition: Status is: Inpatient Remains inpatient appropriate because: Deconditioning,sacral decubitus,pleural effusions.  Planned Discharge Destination: Skilled nursing facility    Time spent: 42 minutes  Author: Deliliah Room, MD 06/05/2024 8:33 AM  For on call review [email or web address].

## 2024-06-05 NOTE — Plan of Care (Signed)
  Problem: Fluid Volume: Goal: Hemodynamic stability will improve Outcome: Progressing   Problem: Clinical Measurements: Goal: Diagnostic test results will improve Outcome: Progressing Goal: Signs and symptoms of infection will decrease Outcome: Progressing   Problem: Respiratory: Goal: Ability to maintain adequate ventilation will improve Outcome: Progressing   Problem: Education: Goal: Knowledge of risk factors and measures for prevention of condition will improve Outcome: Progressing   Problem: Coping: Goal: Psychosocial and spiritual needs will be supported Outcome: Progressing   Problem: Respiratory: Goal: Will maintain a patent airway Outcome: Progressing Goal: Complications related to the disease process, condition or treatment will be avoided or minimized Outcome: Progressing   Problem: Education: Goal: Ability to describe self-care measures that may prevent or decrease complications (Diabetes Survival Skills Education) will improve Outcome: Progressing   Problem: Coping: Goal: Ability to adjust to condition or change in health will improve Outcome: Progressing   Problem: Fluid Volume: Goal: Ability to maintain a balanced intake and output will improve Outcome: Progressing   Problem: Health Behavior/Discharge Planning: Goal: Ability to manage health-related needs will improve Outcome: Progressing   Problem: Metabolic: Goal: Ability to maintain appropriate glucose levels will improve Outcome: Progressing   Problem: Nutritional: Goal: Maintenance of adequate nutrition will improve Outcome: Progressing Goal: Progress toward achieving an optimal weight will improve Outcome: Progressing

## 2024-06-05 NOTE — Progress Notes (Signed)
 Occupational Therapy Treatment Patient Details Name: Earl [name] MRN: [id number] DOB: 09/27/1941 Today's Date: 06/05/2024   History of present illness 83 y.o. male admitted from [facility] rehab on 03/04/24 with SOB, sacral pain, Afib with RVR. Pt with acute metabolic encephalopathy, rhabdomyolysis and sepsis secondary to sacral wound infection. 4/15 & 4/17 bedside sacral wound debridement. Pt also with B pleural effusion, 6/18 L thoracentesis, 7/14 L & R thoracentesis.  PMhx: adm 3/26-4/08/2024 with fall, AMS, COVID; d/c to SNF rehab. CVA (2021, residual R-side weakness), T2DM, HTN, CHF, MGUS, Afib on Eliquis .   OT comments  Pt making slow progress towards pt focused goals, he was able to tolerate multiple STS attempts using the stedy with Mod A+2 to Max A +1 but continues to fatigue easily. Worked on core activation and maintain balance in an upright sitting position using therapeutic activities and Adls, unable to perform dynamic tasks in standing with his level of fatigue today. OT to continue following pt acutely to progress as able. Patient will benefit from continued inpatient follow up therapy, <3 hours/day       If plan is discharge home, recommend the following:  A lot of help with walking and/or transfers;A lot of help with bathing/dressing/bathroom;Assistance with cooking/housework;Assist for transportation;Help with stairs or ramp for entrance;Two people to help with walking and/or transfers;Assistance with feeding   Equipment Recommendations  Other (comment) (defer)    Recommendations for Other Services      Precautions / Restrictions Precautions Precautions: Fall Recall of Precautions/Restrictions: Impaired Precaution/Restrictions Comments: sacral wound; L hip wound with wound vac, bilat heel wounds; L shoulder wound Restrictions Weight Bearing Restrictions Per Provider Order: No       Mobility Bed Mobility Overal bed mobility: Needs Assistance Bed Mobility:  Supine to Sit, Sit to Supine     Supine to sit: Mod assist, +2 for physical assistance, +2 for safety/equipment Sit to supine: Mod assist, +2 for physical assistance, +2 for safety/equipment   General bed mobility comments: ModAx2 to complete moving LE's off EOB and raise trunk. ModAx2 for return to supine to control trunk descent and raise LE's onto bed    Transfers Overall transfer level: Needs assistance Equipment used: Ambulation equipment used Transfers: Sit to/from Stand Sit to Stand: Mod assist, +2 safety/equipment, +2 physical assistance, From elevated surface           General transfer comment: ModAx2 for boost-up and steadying from EOB. Assist to lean forwards to reach for stedy bar handle and for hip extension. ModA to ModAx2 from elevated flaps     Balance Overall balance assessment: Needs assistance Sitting-balance support: Feet supported Sitting balance-Leahy Scale: Poor Sitting balance - Comments: left lateral and posterior lean requiring CGA-ModA. Frequent cues to achieve midline position Postural control: Left lateral lean, Posterior lean Standing balance support: Bilateral upper extremity supported Standing balance-Leahy Scale: Zero Standing balance comment: stood with 2 assist in [place]                           ADL either performed or assessed with clinical judgement   ADL               Lower Body Bathing: Moderate assistance;Sitting/lateral leans Lower Body Bathing Details (indicate cue type and reason): Applying lotion to BLEs, min to mod A needed to faciliate forward lean. and maintain bal.     Lower Body Dressing: Total assistance;Sitting/lateral leans Lower Body Dressing Details (indicate cue type and reason):  Pt not able to reach BLEs or achieve fig four position to assist with LBD.     Toileting- Clothing Manipulation and Hygiene: Sit to/from stand;+2 for physical assistance;Total assistance Toileting - Clothing Manipulation  Details (indicate cue type and reason): rear pericare     Functional mobility during ADLs: Moderate assistance;+2 for safety/equipment;+2 for physical assistance (stand with stedy.)      Extremity/Trunk Assessment Upper Extremity Assessment Upper Extremity Assessment: Generalized weakness RUE Deficits / Details: impaired grip/grasp RUE Coordination: decreased fine motor;decreased gross motor LUE Deficits / Details: able to grasp washcloth to wash face and hands LUE Coordination: decreased fine motor            Vision       Perception     Praxis     Communication Communication Communication: Impaired Factors Affecting Communication: Reduced clarity of speech;Difficulty expressing self   Cognition Arousal: Alert Behavior During Therapy: WFL for tasks assessed/performed Cognition: No apparent impairments                               Following commands: Impaired Following commands impaired: Follows one step commands with increased time      Cueing   Cueing Techniques: Verbal cues, Gestural cues  Exercises Other Exercises Other Exercises: work on mid-line position with MinA/ModA and frequent cueing Other Exercises: Lateral leans onto propped elbow, Bilat x5 Other Exercises: Dynamic reaching with BUEs, MinA/CGA for anterior lean    Shoulder Instructions       General Comments Pt reports having another bowl movement upon return to supine, stated he wants more time and declined use of bed pan. Notified RN.    Pertinent Vitals/ Pain       Pain Assessment Pain Assessment: Faces Faces Pain Scale: Hurts little more Pain Location: BLEs Pain Descriptors / Indicators: Aching, Discomfort, Grimacing Pain Intervention(s): Monitored during session, Limited activity within patient's tolerance, Repositioned  Home Living                                          Prior Functioning/Environment              Frequency  Min 1X/week         Progress Toward Goals  OT Goals(current goals can now be found in the care plan section)  Progress towards OT goals: Progressing toward goals (slow progress)  Acute Rehab OT Goals Patient Stated Goal: work on getting stronger OT Goal Formulation: With patient Time For Goal Achievement: 06/19/24 Potential to Achieve Goals: Fair ADL Goals Pt Will Perform Eating: with set-up;with supervision;with adaptive utensils;sitting  Plan      Co-evaluation                 AM-PAC OT 6 Clicks Daily Activity     Outcome Measure   Help from another person eating meals?: A Little Help from another person taking care of personal grooming?: A Little Help from another person toileting, which includes using toliet, bedpan, or urinal?: Total Help from another person bathing (including washing, rinsing, drying)?: A Lot Help from another person to put on and taking off regular upper body clothing?: A Lot Help from another person to put on and taking off regular lower body clothing?: Total 6 Click Score: 12    End of Session Equipment Utilized During Treatment: Gait belt;Other (comment) (  stedy)  OT Visit Diagnosis: Other abnormalities of gait and mobility (R26.89);Muscle weakness (generalized) (M62.81);History of falling (Z91.81);Feeding difficulties (R63.3);Ataxia, unspecified (R27.0) Pain - Right/Left: Left Pain - part of body: Leg (sacral and heel wounds)   Activity Tolerance Patient tolerated treatment well   Patient Left in bed;with call bell/phone within reach;with family/visitor present   Nurse Communication Mobility status        Time: 8465-8387 OT Time Calculation (min): 38 min  Charges: OT General Charges $OT Visit: 1 Visit OT Treatments $Therapeutic Activity: 23-37 mins  06/05/2024  AB, OTR/L  Acute Rehabilitation Services  Office: [phone]   [name] 06/05/2024, 6:33 PM

## 2024-06-06 ENCOUNTER — Inpatient Hospital Stay (HOSPITAL_COMMUNITY)

## 2024-06-06 DIAGNOSIS — A419 Sepsis, unspecified organism: Secondary | ICD-10-CM | POA: Diagnosis not present

## 2024-06-06 DIAGNOSIS — R6520 Severe sepsis without septic shock: Secondary | ICD-10-CM | POA: Diagnosis not present

## 2024-06-06 LAB — GLUCOSE, CAPILLARY
Glucose-Capillary: 144 mg/dL — ABNORMAL HIGH (ref 70–99)
Glucose-Capillary: 204 mg/dL — ABNORMAL HIGH (ref 70–99)
Glucose-Capillary: 303 mg/dL — ABNORMAL HIGH (ref 70–99)
Glucose-Capillary: 311 mg/dL — ABNORMAL HIGH (ref 70–99)
Glucose-Capillary: 202 mg/dL — ABNORMAL HIGH (ref 70–99)

## 2024-06-06 MED ORDER — MECLIZINE HCL 12.5 MG PO TABS: 12.5000 mg | ORAL_TABLET | Freq: Two times a day (BID) | ORAL | Status: DC | PRN

## 2024-06-06 NOTE — Plan of Care (Signed)
  Problem: Fluid Volume: Goal: Hemodynamic stability will improve Outcome: Progressing   Problem: Clinical Measurements: Goal: Diagnostic test results will improve Outcome: Progressing Goal: Signs and symptoms of infection will decrease Outcome: Progressing   Problem: Respiratory: Goal: Ability to maintain adequate ventilation will improve Outcome: Progressing   Problem: Education: Goal: Knowledge of risk factors and measures for prevention of condition will improve Outcome: Progressing   Problem: Coping: Goal: Psychosocial and spiritual needs will be supported Outcome: Progressing

## 2024-06-06 NOTE — Progress Notes (Signed)
 Progress Note   Patient: Earl [name] FMW:[id number] DOB: 1941/05/17 DOA: 03/04/2024     94 DOS: the patient was seen and examined on 06/06/2024   Brief hospital course: 83 year old male with history of PAF on Eliquis , IDDM 2, HTN, CVA with right-sided residual weakness, MGUS, BPH, systolic CHF who is here with sacral pain. Of note, he was hospitalized 3/26-4/10 with generalized weakness/acute encephalopathy, hypotension, and was COVID PCR positive. He was eventually discharged to SNF and readmitted 4 days later with pain in the sacral area and shortness of breath. He was found to be in A-fib with RVR, tachypneic and was hypotensive on admission for which critical care was consulted. General surgery was consulted for sacral wound debridement status post bedside excision 4/15 and 03/07/2024.   Assessment and Plan:   Infected Sacral decubitus ulcer, stage IV  Pressure injuries present on admission.   Severe sepsis secondary to pressure ulcers. -She was treated with broad-spectrum antibiotics.  Seen by infectious disease.  Completed antibiotic therapy.   -CT on 6/16 concern for possible osteomyelitis. Surgery consulted and performed wound debridement during this hospitalization.  Plastic surgery was also consulted with recommendation for continued wound care and no surgical management. -Continue current wound care, continue pressure relieving therapies.  -7/11 Wound care reported more sloughing of the left hip wound,  -7/13: repeated CT : Similar appearance of osteomyelitis underlying sacral decubitus ulcer. The ulcer itself is less concave in appearance compared to prior. No new focal drainable fluid collection. Soft tissue irregularity overlying the LEFT lateral hip without suspicious subjacent osseous changes. There is mild adjacent fascial enhancement likely reflecting reactive myositis. No focal drainable fluid collection noted. -Continue with wound care, currently, she is afebrile/no  leukocytosis.  -Ortho consulted.  Left trochanteric ulceration with yellow sloughing: Debulked by ortho at the bedside on 7/14, wound care on board.Skin protected to the hip with VAC drape for foam bridge    Acute on chronic pain: Continue oxycodone  as needed Continue Tylenol  as needed. LFT stable. Continue with Lidocaine  patch.    Long standing persistent atrial fibrillation: rate controlled Continue amiodarone , Eliquis .     Acute on chronic combined heart failure Anasarca Continue Tafamidis  and vutrisiran . Continue with lasix   Right wrist pain: No evidence of fractures on wrist xray that was done on 7/15. She does have degenerative changes there.   Bilateral pleural effusion: Transudative Patient underwent left-sided thoracentesis on 6/18 yielded 600 mL of hazy amber fluid.  Body fluid culture with no growth. S/p b/l thoracentesis done on 7/14 by pulmonologist. It is Transudative so likely related to cardiomyopathy and poor nutritional status. Pulm on board    Anemia of chronic disease Hb stable.    Dysphagia Speech therapy on board. Diet recommendations: Thin liquid; Dysphagia 1 (puree)   Severe protein malnutrition Continue dietician recommendations, encourage diet, supplements.   Diabetes mellitus type 2, now with hypoglycemia. hemoglobin A1C of 8.4%.  Continue Semglee  and SSI.  Decreased dose of insulin  with poor appetite and low blood sugars.  DVT Prophylaxis: On eliquis      Subjective: No acute events overnight. She was trying to turn on the TV and needed help with that. She also complained of dizziness when trying to get up and I spoke about prn meclizine  to help with that.  Physical Exam: Vitals:   06/05/24 1934 06/05/24 2339 06/06/24 0400 06/06/24 0822  BP: 110/67 107/67 114/74 110/74  Pulse: 74 74 72 70  Resp: 18 18 16 16   Temp: (!) 97.5 F (36.4  C) 98 F (36.7 C) 97.6 F (36.4 C) 97.6 F (36.4 C)  TempSrc:   Oral   SpO2: 100% 96% 95% 100%   Weight:      Height:       Constitutional: NAD, calm, comfortable Eyes: PERRL, lids and conjunctivae normal ENMT: Mucous membranes are moist. Posterior pharynx clear of any exudate or lesions.Normal dentition.  Neck: normal, supple, no masses, no thyromegaly Respiratory: clear to auscultation bilaterally, no wheezing, no crackles. Normal respiratory effort. No accessory muscle use.  Cardiovascular: Regular rate and rhythm, no murmurs / rubs / gallops. No extremity edema. 2+ pedal pulses. No carotid bruits.  Abdomen: no tenderness, no masses palpated. No hepatosplenomegaly. Bowel sounds positive.  Musculoskeletal: no clubbing / cyanosis.Slight stiffness and tenderness to the right wrist joint. Skin: Sacral decubitus ulcer, left trochanteric dressing in place Neurologic: CN 2-12 grossly intact. Sensation intact, DTR normal. Strength 5/5 x all 4 extremities.  Psychiatric: Normal judgment and insight. Alert and oriented x 3. Normal mood.   Data Reviewed:  There are no new results to review at this time.  Family Communication: None at the bedside  Disposition: Status is: Inpatient Remains inpatient appropriate because: Deconditioning,sacral decubitus,pleural effusions.  Planned Discharge Destination: Skilled nursing facility    Time spent: 43 minutes  Author: Deliliah Room, MD 06/06/2024 9:26 AM  For on call review [email or web address].

## 2024-06-07 DIAGNOSIS — A419 Sepsis, unspecified organism: Secondary | ICD-10-CM | POA: Diagnosis not present

## 2024-06-07 DIAGNOSIS — R6520 Severe sepsis without septic shock: Secondary | ICD-10-CM | POA: Diagnosis not present

## 2024-06-07 LAB — BODY FLUID CULTURE W GRAM STAIN
Culture: NO GROWTH
Culture: NO GROWTH
Gram Stain: NONE SEEN
Gram Stain: NONE SEEN

## 2024-06-07 LAB — GLUCOSE, CAPILLARY
Glucose-Capillary: 102 mg/dL — ABNORMAL HIGH (ref 70–99)
Glucose-Capillary: 153 mg/dL — ABNORMAL HIGH (ref 70–99)
Glucose-Capillary: 182 mg/dL — ABNORMAL HIGH (ref 70–99)
Glucose-Capillary: 262 mg/dL — ABNORMAL HIGH (ref 70–99)

## 2024-06-07 NOTE — Progress Notes (Addendum)
 Progress Note   Patient: Earl [name] FMW:[id number] DOB: Jun 14, 1941 DOA: 03/04/2024     83 DOS: the patient was seen and examined on 06/07/2024   Brief hospital course: 83 year old male with history of PAF on Eliquis , IDDM 2, HTN, CVA with right-sided residual weakness, MGUS, BPH, systolic CHF who is here with sacral pain. Of note, he was hospitalized 3/26-4/10 with generalized weakness/acute encephalopathy, hypotension, and was COVID PCR positive. He was eventually discharged to SNF and readmitted 4 days later with pain in the sacral area and shortness of breath. He was found to be in A-fib with RVR, tachypneic and was hypotensive on admission for which critical care was consulted. General surgery was consulted for sacral wound debridement status post bedside excision 4/15 and 03/07/2024.   Assessment and Plan:   Infected Sacral decubitus ulcer, stage IV  Pressure injuries present on admission.  Wound type: Unstageable Pressure Injury: left shoulder Unstageable Pressure Injury: left trochanter Unstageable Pressure Injury; right heel Stage 4 Pressure Injury; sacrum   Severe sepsis secondary to pressure ulcers. -He was treated with broad-spectrum antibiotics.  Seen by infectious disease.  Completed antibiotic therapy.   -CT on 6/16 concern for possible osteomyelitis. Surgery consulted and performed wound debridement during this hospitalization.  Plastic surgery was also consulted with recommendation for continued wound care and no surgical management. -Continue current wound care, continue pressure relieving therapies.  -7/11 Wound care reported more sloughing of the left hip wound,  -7/13: repeated CT : Similar appearance of osteomyelitis underlying sacral decubitus ulcer. The ulcer itself is less concave in appearance compared to prior. No new focal drainable fluid collection. Soft tissue irregularity overlying the LEFT lateral hip without suspicious subjacent osseous changes. There  is mild adjacent fascial enhancement likely reflecting reactive myositis. No focal drainable fluid collection noted. -Continue with wound care, currently, he is afebrile/no leukocytosis.  -Ortho consulted.  Left trochanteric ulceration with yellow sloughing: Debulked by ortho at the bedside on 7/14, wound care on board.Skin protected to the hip with VAC drape for foam bridge    Acute on chronic pain: Continue oxycodone  as needed Continue Tylenol  as needed. LFT stable. Continue with Lidocaine  patch.    Long standing persistent atrial fibrillation: rate controlled Continue amiodarone  and Eliquis .     Acute on chronic combined heart failure Anasarca Continue Tafamidis  and vutrisiran . Continue with lasix   Right wrist pain: Improved. No evidence of fractures on wrist xray that was done on 7/15. She does have degenerative changes there.   Recurrent bilateral pleural effusion: Transudative Patient underwent left-sided thoracentesis on 6/18 yielded 600 mL of hazy amber fluid.  Body fluid culture with no growth. S/p b/l thoracentesis done on 7/14 by pulmonologist. It is Transudative so likely related to cardiomyopathy and poor nutritional status. Pulm on board    Anemia of chronic disease Hb stable.    Dysphagia Speech therapy on board. Diet recommendations: Thin liquid; Dysphagia 1 (puree)   Severe protein malnutrition Continue dietician recommendations, encourage diet, supplements.   Diabetes mellitus type 2, now with hypoglycemia. hemoglobin A1C of 8.4%.  Continue Semglee  and SSI.  Decreased dose of insulin  with poor appetite and low blood sugars.  DVT Prophylaxis: On eliquis   Overall prognosis is guarded.     Subjective: No acute events overnight. He was again trying to find TV remote this morning. Denies chest pain or shortness of breath.  Physical Exam: Vitals:   06/06/24 1653 06/06/24 1945 06/06/24 2358 06/07/24 0428  BP: 116/67 115/62 108/66 105/63  Pulse: 73  72 71  72  Resp: 18 18 18 18   Temp: 97.6 F (36.4 C) 97.9 F (36.6 C) 97.6 F (36.4 C) 97.6 F (36.4 C)  TempSrc: Oral Oral Oral   SpO2: 100% 100% 95% 100%  Weight:      Height:       Constitutional: NAD, calm, comfortable Eyes: PERRL, lids and conjunctivae normal ENMT: Mucous membranes are moist. Posterior pharynx clear of any exudate or lesions.Normal dentition.  Neck: normal, supple, no masses, no thyromegaly Respiratory: Slightly diminished breath sounds at the bases  Cardiovascular: Regular rate and rhythm, no murmurs / rubs / gallops. No extremity edema. 2+ pedal pulses. No carotid bruits.  Abdomen: no tenderness, no masses palpated. No hepatosplenomegaly. Bowel sounds positive.  Musculoskeletal: no clubbing / cyanosis.Slight stiffness and tenderness to the right wrist joint. Skin: Sacral decubitus ulcer, left trochanteric dressing in place Neurologic: CN 2-12 grossly intact. Sensation intact, DTR normal. Strength 4/5 x all 4 extremities.  .   Data Reviewed:  There are no new results to review at this time.  Family Communication: Spoke to patient's daughter in length over the phone on 7/18  Disposition: Status is: Inpatient Remains inpatient appropriate because: Deconditioning,sacral decubitus,pleural effusions.  Planned Discharge Destination: Home with HHS    Time spent: 41 minutes  Author: Deliliah Room, MD 06/07/2024 9:21 AM  For on call review [email or web address].

## 2024-06-07 NOTE — Plan of Care (Signed)
  Problem: Fluid Volume: Goal: Hemodynamic stability will improve Outcome: Progressing   Problem: Clinical Measurements: Goal: Diagnostic test results will improve Outcome: Progressing Goal: Signs and symptoms of infection will decrease Outcome: Progressing   Problem: Respiratory: Goal: Ability to maintain adequate ventilation will improve Outcome: Progressing   Problem: Education: Goal: Knowledge of risk factors and measures for prevention of condition will improve Outcome: Progressing   Problem: Coping: Goal: Psychosocial and spiritual needs will be supported Outcome: Progressing   Problem: Respiratory: Goal: Will maintain a patent airway Outcome: Progressing Goal: Complications related to the disease process, condition or treatment will be avoided or minimized Outcome: Progressing   Problem: Coping: Goal: Ability to adjust to condition or change in health will improve Outcome: Progressing   Problem: Fluid Volume: Goal: Ability to maintain a balanced intake and output will improve Outcome: Progressing   Problem: Health Behavior/Discharge Planning: Goal: Ability to manage health-related needs will improve Outcome: Progressing   Problem: Metabolic: Goal: Ability to maintain appropriate glucose levels will improve Outcome: Progressing   Problem: Nutritional: Goal: Progress toward achieving an optimal weight will improve Outcome: Progressing   Problem: Skin Integrity: Goal: Risk for impaired skin integrity will decrease Outcome: Progressing   Problem: Tissue Perfusion: Goal: Adequacy of tissue perfusion will improve Outcome: Progressing   Problem: Education: Goal: Knowledge of General Education information will improve Description: Including pain rating scale, medication(s)/side effects and non-pharmacologic comfort measures Outcome: Progressing   Problem: Health Behavior/Discharge Planning: Goal: Ability to manage health-related needs will  improve Outcome: Progressing   Problem: Clinical Measurements: Goal: Ability to maintain clinical measurements within normal limits will improve Outcome: Progressing Goal: Will remain free from infection Outcome: Progressing Goal: Diagnostic test results will improve Outcome: Progressing Goal: Respiratory complications will improve Outcome: Progressing Goal: Cardiovascular complication will be avoided Outcome: Progressing   Problem: Activity: Goal: Risk for activity intolerance will decrease Outcome: Progressing   Problem: Coping: Goal: Level of anxiety will decrease Outcome: Progressing   Problem: Elimination: Goal: Will not experience complications related to urinary retention Outcome: Progressing   Problem: Pain Managment: Goal: General experience of comfort will improve and/or be controlled Outcome: Progressing   Problem: Safety: Goal: Ability to remain free from injury will improve Outcome: Progressing   Problem: Skin Integrity: Goal: Risk for impaired skin integrity will decrease Outcome: Progressing   Problem: Education: Goal: Ability to describe self-care measures that may prevent or decrease complications (Diabetes Survival Skills Education) will improve Outcome: Not Progressing   Problem: Nutritional: Goal: Maintenance of adequate nutrition will improve Outcome: Not Progressing   Problem: Nutrition: Goal: Adequate nutrition will be maintained Outcome: Not Progressing   Problem: Elimination: Goal: Will not experience complications related to bowel motility Outcome: Not Progressing

## 2024-06-07 NOTE — Plan of Care (Signed)
   Medical records reviewed including progress notes, labs and imaging.  He continues to have poor oral intake, profound weakness, and complications including recurrent pleural effusions, acute CHF exacerbations, and poor wound healing. Most recent albumin  of 2/7 on 7/13. LFTs stable. Underwent BL thoracentesis 7/14.   Goals of care are clear for medical optimization and return home to continue managing chronic illnesses. Patient and family consistently hopeful for improvement.     PMT will continue to follow peripherally and remains available for additional palliative needs.  Thank you for your referral and allowing PMT to assist in Earl [name]'s care.   Jaxson Keener, PA-C Palliative Medicine Team  Team Phone # [phone]   NO CHARGE

## 2024-06-07 NOTE — Progress Notes (Signed)
 Wound care and dressing change done.

## 2024-06-07 NOTE — Plan of Care (Signed)
  Problem: Fluid Volume: Goal: Hemodynamic stability will improve Outcome: Progressing   Problem: Clinical Measurements: Goal: Diagnostic test results will improve Outcome: Progressing Goal: Signs and symptoms of infection will decrease Outcome: Progressing   Problem: Respiratory: Goal: Ability to maintain adequate ventilation will improve Outcome: Progressing   Problem: Education: Goal: Knowledge of risk factors and measures for prevention of condition will improve Outcome: Progressing   Problem: Coping: Goal: Psychosocial and spiritual needs will be supported Outcome: Progressing   Problem: Respiratory: Goal: Will maintain a patent airway Outcome: Progressing Goal: Complications related to the disease process, condition or treatment will be avoided or minimized Outcome: Progressing   Problem: Education: Goal: Ability to describe self-care measures that may prevent or decrease complications (Diabetes Survival Skills Education) will improve Outcome: Progressing   Problem: Coping: Goal: Ability to adjust to condition or change in health will improve Outcome: Progressing   Problem: Fluid Volume: Goal: Ability to maintain a balanced intake and output will improve Outcome: Progressing   Problem: Health Behavior/Discharge Planning: Goal: Ability to manage health-related needs will improve Outcome: Progressing   Problem: Metabolic: Goal: Ability to maintain appropriate glucose levels will improve Outcome: Progressing   Problem: Nutritional: Goal: Maintenance of adequate nutrition will improve Outcome: Progressing Goal: Progress toward achieving an optimal weight will improve Outcome: Progressing   Problem: Skin Integrity: Goal: Risk for impaired skin integrity will decrease Outcome: Progressing   Problem: Tissue Perfusion: Goal: Adequacy of tissue perfusion will improve Outcome: Progressing   Problem: Education: Goal: Knowledge of General Education information  will improve Description: Including pain rating scale, medication(s)/side effects and non-pharmacologic comfort measures Outcome: Progressing   Problem: Health Behavior/Discharge Planning: Goal: Ability to manage health-related needs will improve Outcome: Progressing   Problem: Clinical Measurements: Goal: Ability to maintain clinical measurements within normal limits will improve Outcome: Progressing Goal: Will remain free from infection Outcome: Progressing Goal: Diagnostic test results will improve Outcome: Progressing Goal: Respiratory complications will improve Outcome: Progressing Goal: Cardiovascular complication will be avoided Outcome: Progressing   Problem: Activity: Goal: Risk for activity intolerance will decrease Outcome: Progressing   Problem: Nutrition: Goal: Adequate nutrition will be maintained Outcome: Progressing   Problem: Coping: Goal: Level of anxiety will decrease Outcome: Progressing   Problem: Elimination: Goal: Will not experience complications related to bowel motility Outcome: Progressing Goal: Will not experience complications related to urinary retention Outcome: Progressing   Problem: Pain Managment: Goal: General experience of comfort will improve and/or be controlled Outcome: Progressing   Problem: Safety: Goal: Ability to remain free from injury will improve Outcome: Progressing   Problem: Skin Integrity: Goal: Risk for impaired skin integrity will decrease Outcome: Progressing

## 2024-06-08 DIAGNOSIS — A419 Sepsis, unspecified organism: Secondary | ICD-10-CM | POA: Diagnosis not present

## 2024-06-08 DIAGNOSIS — R6520 Severe sepsis without septic shock: Secondary | ICD-10-CM | POA: Diagnosis not present

## 2024-06-08 LAB — GLUCOSE, CAPILLARY
Glucose-Capillary: 116 mg/dL — ABNORMAL HIGH (ref 70–99)
Glucose-Capillary: 174 mg/dL — ABNORMAL HIGH (ref 70–99)
Glucose-Capillary: 156 mg/dL — ABNORMAL HIGH (ref 70–99)
Glucose-Capillary: 165 mg/dL — ABNORMAL HIGH (ref 70–99)

## 2024-06-08 NOTE — Progress Notes (Signed)
 Progress Note   Patient: Maggie Rosetti FMW:[id number] DOB: 05-10-1941 DOA: 03/04/2024     96 DOS: the patient was seen and examined on 06/08/2024   Brief hospital course: 83 year old male with history of PAF on Eliquis , IDDM 2, HTN, CVA with right-sided residual weakness, MGUS, BPH, systolic CHF who is here with sacral pain. Of note, he was hospitalized 3/26-4/10 with generalized weakness/acute encephalopathy, hypotension, and was COVID PCR positive. He was eventually discharged to SNF and readmitted 4 days later with pain in the sacral area and shortness of breath. He was found to be in A-fib with RVR, tachypneic and was hypotensive on admission for which critical care was consulted. General surgery was consulted for sacral wound debridement status post bedside excision 4/15 and 03/07/2024.   Assessment and Plan:   Infected Sacral decubitus ulcer, stage IV  Pressure injuries present on admission.  Wound type: Unstageable Pressure Injury: left shoulder Unstageable Pressure Injury: left trochanter Unstageable Pressure Injury; right heel Stage 4 Pressure Injury; sacrum   Severe sepsis secondary to pressure ulcers. -He was treated with broad-spectrum antibiotics.  Seen by infectious disease.  Completed antibiotic therapy.   -CT on 6/16 concern for possible osteomyelitis. Surgery consulted and performed wound debridement during this hospitalization.  Plastic surgery was also consulted with recommendation for continued wound care and no surgical management. -Continue current wound care, continue pressure relieving therapies.  -7/11 Wound care reported more sloughing of the left hip wound,  -7/13: repeated CT : Similar appearance of osteomyelitis underlying sacral decubitus ulcer. The ulcer itself is less concave in appearance compared to prior. No new focal drainable fluid collection. Soft tissue irregularity overlying the LEFT lateral hip without suspicious subjacent osseous changes. There  is mild adjacent fascial enhancement likely reflecting reactive myositis. No focal drainable fluid collection noted. -Continue with wound care, currently, he remains afebrile  Left trochanteric ulceration with yellow sloughing: Debulked by ortho at the bedside on 7/14, wound care on board.Skin protected to the hip with VAC drape for foam bridge    Acute on chronic pain: Continue oxycodone  as needed Continue Tylenol  as needed. LFT stable. Continue with Lidocaine  patch.    Long standing persistent atrial fibrillation:  Rate controlled with HR in the 60s to 70s Continue amiodarone  and Eliquis .     Acute on chronic combined heart failure Anasarca Continue Tafamidis  and vutrisiran . Continue with lasix   Right wrist pain: Improved. No evidence of fractures on wrist xray that was done on 7/15. He does have degenerative changes there.   Recurrent bilateral pleural effusion: Transudative Patient underwent left-sided thoracentesis on 6/18 yielded 600 mL of hazy amber fluid.  Body fluid culture with no growth. S/p b/l thoracentesis done on 7/14 by pulmonologist. It is Transudative so likely related to cardiomyopathy and poor nutritional status. Pulm on board  Anemia of chronic disease Hb stable.    Dysphagia Speech therapy on board. Diet recommendations: Thin liquid; Dysphagia 1 (puree)   Severe protein malnutrition Continue dietician recommendations, encourage diet, supplements.   Diabetes mellitus type 2, now with hypoglycemia. hemoglobin A1C of 8.4%.  Continue Semglee  and SSI.  Decreased dose of insulin  with poor appetite and low blood sugars.  DVT Prophylaxis: On eliquis   Overall prognosis is guarded.     Subjective: No acute events overnight. Evaluated at bedside waiting for some pain medicine. Reports pain in his buttocks but no other complaints.  Physical Exam: Vitals:   06/07/24 1239 06/07/24 1718 06/07/24 2040 06/08/24 0500  BP: 109/75 128/70 108/63 (!) 106/59  Pulse:  73 79 77 66  Resp:   17   Temp: (!) 97.5 F (36.4 C) 97.6 F (36.4 C) 97.6 F (36.4 C) 97.7 F (36.5 C)  TempSrc:      SpO2: 100% 100% 100%   Weight:      Height:       General: Chronically ill elderly man laying in bed. No acute distress. HEENT: [place]/AT. Anicteric sclera CV: RRR. No murmurs, rubs, or gallops. No LE edema Pulmonary: Lungs CTAB. Normal effort. No wheezing or rales. Abdominal: Soft, nontender, nondistended. Normal bowel sounds. Extremities: Palpable radial and DP pulses. Normal ROM. Skin: Warm and dry. Sacral decubitus ulcer, left trochanteric dressing in place. Neuro: A&Ox3. Moves all extremities. Normal sensation to light touch. No focal deficit. Psych: Normal mood and affect   Data Reviewed: Blood sugar 150-260s in the last 24 hours  Family Communication: No family at bedside  Disposition: Status is: Inpatient Remains inpatient appropriate because: Deconditioning,sacral decubitus,pleural effusions.  Planned Discharge Destination: Home with HHS    Time spent: 25 minutes  Author: Claretta CHRISTELLA Alderman, MD 06/08/2024 7:45 AM  For on call review [email or web address].

## 2024-06-08 NOTE — Progress Notes (Signed)
 Wound care and dressing changed performed per orders.

## 2024-06-08 NOTE — Plan of Care (Signed)
  Problem: Fluid Volume: Goal: Hemodynamic stability will improve Outcome: Progressing   Problem: Clinical Measurements: Goal: Diagnostic test results will improve Outcome: Progressing Goal: Signs and symptoms of infection will decrease Outcome: Progressing   Problem: Respiratory: Goal: Ability to maintain adequate ventilation will improve Outcome: Progressing   Problem: Education: Goal: Knowledge of risk factors and measures for prevention of condition will improve Outcome: Progressing   Problem: Coping: Goal: Psychosocial and spiritual needs will be supported Outcome: Progressing   Problem: Respiratory: Goal: Will maintain a patent airway Outcome: Progressing Goal: Complications related to the disease process, condition or treatment will be avoided or minimized Outcome: Progressing   Problem: Education: Goal: Ability to describe self-care measures that may prevent or decrease complications (Diabetes Survival Skills Education) will improve Outcome: Progressing   Problem: Coping: Goal: Ability to adjust to condition or change in health will improve Outcome: Progressing   Problem: Fluid Volume: Goal: Ability to maintain a balanced intake and output will improve Outcome: Progressing   Problem: Health Behavior/Discharge Planning: Goal: Ability to manage health-related needs will improve Outcome: Progressing   Problem: Metabolic: Goal: Ability to maintain appropriate glucose levels will improve Outcome: Progressing   Problem: Nutritional: Goal: Maintenance of adequate nutrition will improve Outcome: Progressing Goal: Progress toward achieving an optimal weight will improve Outcome: Progressing   Problem: Skin Integrity: Goal: Risk for impaired skin integrity will decrease Outcome: Progressing   Problem: Tissue Perfusion: Goal: Adequacy of tissue perfusion will improve Outcome: Progressing   Problem: Education: Goal: Knowledge of General Education information  will improve Description: Including pain rating scale, medication(s)/side effects and non-pharmacologic comfort measures Outcome: Progressing   Problem: Health Behavior/Discharge Planning: Goal: Ability to manage health-related needs will improve Outcome: Progressing   Problem: Clinical Measurements: Goal: Ability to maintain clinical measurements within normal limits will improve Outcome: Progressing Goal: Will remain free from infection Outcome: Progressing Goal: Diagnostic test results will improve Outcome: Progressing Goal: Respiratory complications will improve Outcome: Progressing Goal: Cardiovascular complication will be avoided Outcome: Progressing   Problem: Activity: Goal: Risk for activity intolerance will decrease Outcome: Progressing   Problem: Nutrition: Goal: Adequate nutrition will be maintained Outcome: Progressing   Problem: Coping: Goal: Level of anxiety will decrease Outcome: Progressing   Problem: Elimination: Goal: Will not experience complications related to bowel motility Outcome: Progressing Goal: Will not experience complications related to urinary retention Outcome: Progressing   Problem: Pain Managment: Goal: General experience of comfort will improve and/or be controlled Outcome: Progressing   Problem: Safety: Goal: Ability to remain free from injury will improve Outcome: Progressing   Problem: Skin Integrity: Goal: Risk for impaired skin integrity will decrease Outcome: Progressing

## 2024-06-09 DIAGNOSIS — L89154 Pressure ulcer of sacral region, stage 4: Secondary | ICD-10-CM | POA: Diagnosis not present

## 2024-06-09 DIAGNOSIS — A419 Sepsis, unspecified organism: Secondary | ICD-10-CM | POA: Diagnosis not present

## 2024-06-09 DIAGNOSIS — R6520 Severe sepsis without septic shock: Secondary | ICD-10-CM | POA: Diagnosis not present

## 2024-06-09 LAB — CBC
HCT: 35 % — ABNORMAL LOW (ref 39.0–52.0)
Hemoglobin: 11.8 g/dL — ABNORMAL LOW (ref 13.0–17.0)
MCH: 28.9 pg (ref 26.0–34.0)
MCHC: 33.7 g/dL (ref 30.0–36.0)
MCV: 85.8 fL (ref 80.0–100.0)
Platelets: 193 K/uL (ref 150–400)
RBC: 4.08 MIL/uL — ABNORMAL LOW (ref 4.22–5.81)
RDW: 17.9 % — ABNORMAL HIGH (ref 11.5–15.5)
WBC: 5 K/uL (ref 4.0–10.5)
nRBC: 0 % (ref 0.0–0.2)

## 2024-06-09 LAB — COMPREHENSIVE METABOLIC PANEL WITH GFR
ALT: 77 U/L — ABNORMAL HIGH (ref 0–44)
AST: 67 U/L — ABNORMAL HIGH (ref 15–41)
Albumin: 2.2 g/dL — ABNORMAL LOW (ref 3.5–5.0)
Alkaline Phosphatase: 127 U/L — ABNORMAL HIGH (ref 38–126)
Anion gap: 8 (ref 5–15)
BUN: 27 mg/dL — ABNORMAL HIGH (ref 8–23)
CO2: 23 mmol/L (ref 22–32)
Calcium: 8.4 mg/dL — ABNORMAL LOW (ref 8.9–10.3)
Chloride: 105 mmol/L (ref 98–111)
Creatinine, Ser: 0.86 mg/dL (ref 0.61–1.24)
GFR, Estimated: >60 mL/min (ref >60)
Glucose, Bld: 177 mg/dL — ABNORMAL HIGH (ref 70–99)
Potassium: 4.3 mmol/L (ref 3.5–5.1)
Sodium: 136 mmol/L (ref 135–145)
Total Bilirubin: 0.9 mg/dL (ref 0.0–1.2)
Total Protein: 5.2 g/dL — ABNORMAL LOW (ref 6.5–8.1)

## 2024-06-09 LAB — GLUCOSE, CAPILLARY
Glucose-Capillary: 174 mg/dL — ABNORMAL HIGH (ref 70–99)
Glucose-Capillary: 159 mg/dL — ABNORMAL HIGH (ref 70–99)
Glucose-Capillary: 189 mg/dL — ABNORMAL HIGH (ref 70–99)
Glucose-Capillary: 129 mg/dL — ABNORMAL HIGH (ref 70–99)

## 2024-06-09 MED ORDER — INSULIN ASPART 100 UNIT/ML IJ SOLN
0.0000 [IU] | Freq: Three times a day (TID) | INTRAMUSCULAR | Status: DC
  Administered 2024-06-10: 3 [IU] via SUBCUTANEOUS
  Administered 2024-06-10: 1 [IU] via SUBCUTANEOUS
  Administered 2024-06-10: 2 [IU] via SUBCUTANEOUS
  Administered 2024-06-11: 1 [IU] via SUBCUTANEOUS
  Administered 2024-06-11 – 2024-06-12 (×2): 3 [IU] via SUBCUTANEOUS
  Administered 2024-06-12: 2 [IU] via SUBCUTANEOUS
  Administered 2024-06-13: 1 [IU] via SUBCUTANEOUS
  Administered 2024-06-13 – 2024-06-14 (×2): 2 [IU] via SUBCUTANEOUS
  Administered 2024-06-14: 3 [IU] via SUBCUTANEOUS

## 2024-06-09 NOTE — Progress Notes (Signed)
 Progress Note   Patient: Earl [name] FMW:[id number] DOB: Apr 02, 1941 DOA: 03/04/2024     97 DOS: the patient was seen and examined on 06/09/2024   Brief hospital course: 83 year old male with history of PAF on Eliquis , IDDM 2, HTN, CVA with right-sided residual weakness, MGUS, BPH, systolic CHF who is here with sacral pain. Of note, he was hospitalized 3/26-4/10 with generalized weakness/acute encephalopathy, hypotension, and was COVID PCR positive. He was eventually discharged to SNF and readmitted 4 days later with pain in the sacral area and shortness of breath. He was found to be in A-fib with RVR, tachypneic and was hypotensive on admission for which critical care was consulted. General surgery was consulted for sacral wound debridement status post bedside excision 4/15 and 03/07/2024.   Assessment and Plan:   Infected Sacral decubitus ulcer, stage IV Pressure injuries present on admission.  Wound type: Unstageable Pressure Injury: left shoulder Unstageable Pressure Injury: left trochanter Unstageable Pressure Injury; right heel Stage 4 Pressure Injury; sacrum   Severe sepsis secondary to pressure ulcers -He was treated with broad-spectrum antibiotics.  Seen by infectious disease.  Completed antibiotic therapy.   -CT on 6/16 concern for possible osteomyelitis. Surgery consulted and performed wound debridement during this hospitalization. Plastic surgery was also consulted with recommendation for continued wound care and no surgical management. -Continue current wound care, continue pressure relieving therapies.  -7/11 Wound care reported more sloughing of the left hip wound,  -7/13: repeated CT: Similar appearance of osteomyelitis underlying sacral decubitus ulcer. The ulcer itself is less concave in appearance compared to prior. No new focal drainable fluid collection. Soft tissue irregularity overlying the LEFT lateral hip without suspicious subjacent osseous changes. There is  mild adjacent fascial enhancement likely reflecting reactive myositis. No focal drainable fluid collection noted. -Continue with wound care, currently, he remains afebrile without leukocytosis  Left trochanteric ulceration with yellow sloughing: Debulked by ortho at the bedside on 7/14, wound care on board. Skin protected to the hip with VAC drape for foam bridge    Acute on chronic pain: Continue oxycodone  as needed Continue Tylenol  as needed. LFT stable. Continue with Lidocaine  patch.    Long standing persistent atrial fibrillation:  Rate controlled with HR in the 60s to 70s Continue amiodarone  and Eliquis .     Acute on chronic combined heart failure Anasarca Continue Tafamidis  and vutrisiran . Continue with lasix   Right wrist pain: Improved. No evidence of fractures on wrist xray that was done on 7/15. He does have degenerative changes there.   Recurrent bilateral pleural effusion: Transudative Patient underwent left-sided thoracentesis on 6/18 yielded 600 mL of hazy amber fluid.  Body fluid culture with no growth. S/p b/l thoracentesis done on 7/14 by pulmonologist. It is Transudative so likely related to cardiomyopathy and poor nutritional status. Pulm on board  Anemia of chronic disease Hb stable at 11.8   Dysphagia Speech therapy on board. Diet recommendations: Thin liquid; Dysphagia 1 (puree)   Severe protein malnutrition Continue dietician recommendations, encourage diet, supplements. Albumin  still low at 2.2   Diabetes mellitus type 2, now with hypoglycemia. hemoglobin A1C of 8.4%.  CBGs still elevated to the 160s-180s Continue Semglee  6 units daily Change SSI from very sensitive to sensitive scale  DVT Prophylaxis: On eliquis   Overall prognosis is guarded.     Subjective: No acute events overnight. Laying comfortably in bed after being cleaned. Reports improvement in his buttocks pain today compared to yesterday. Requesting that I call his daughter for an  update.  Physical  Exam: Vitals:   06/08/24 2007 06/09/24 0350 06/09/24 0500 06/09/24 0741  BP: 116/73 110/67  108/68  Pulse: 72 77  72  Resp: 17 18    Temp: (!) 97.5 F (36.4 C) 98.5 F (36.9 C)  (!) 97.5 F (36.4 C)  TempSrc:      SpO2: 100% 100%  100%  Weight:   71.9 kg   Height:       General: Chronically ill elderly man laying in bed. No acute distress. HEENT: [place]/AT. Anicteric sclera CV: RRR. No murmurs, rubs, or gallops. No LE edema Pulmonary: Lungs CTAB. Normal effort. No wheezing or rales. Abdominal: Soft, nontender, nondistended. Normal bowel sounds. Extremities: Palpable radial and DP pulses. Normal ROM. Skin: Warm and dry. Sacral decubitus ulcer, left trochanteric dressing in place. Neuro: A&Ox3. Moves all extremities. Normal sensation to light touch. No focal deficit. Psych: Normal mood and affect   Data Reviewed:    Latest Ref Rng & Units 06/09/2024    3:36 AM 06/03/2024    4:33 AM 06/02/2024    5:30 AM  CBC  WBC 4.0 - 10.5 K/uL 5.0  4.8  4.8   Hemoglobin 13.0 - 17.0 g/dL 88.1  88.2  87.6   Hematocrit 39.0 - 52.0 % 35.0  35.3  37.5   Platelets 150 - 400 K/uL 193  212  231   .    Latest Ref Rng & Units 06/09/2024    3:36 AM 06/03/2024    4:33 AM 06/02/2024    5:30 AM  CMP  Glucose 70 - 99 mg/dL 822  854  827   BUN 8 - 23 mg/dL 27  23  21    Creatinine 0.61 - 1.24 mg/dL 9.13  9.12  9.09   Sodium 135 - 145 mmol/L 136  137  137   Potassium 3.5 - 5.1 mmol/L 4.3  3.8  4.3   Chloride 98 - 111 mmol/L 105  106  107   CO2 22 - 32 mmol/L 23  23  17    Calcium  8.9 - 10.3 mg/dL 8.4  8.4  8.7   Total Protein 6.5 - 8.1 g/dL 5.2   5.7   Total Bilirubin 0.0 - 1.2 mg/dL 0.9   0.9   Alkaline Phos 38 - 126 U/L 127   116   AST 15 - 41 U/L 67   47   ALT 0 - 44 U/L 77   57    CBG (last 3)  Recent Labs    06/09/24 0635 06/09/24 0739 06/09/24 1227  GLUCAP 174* 159* 189*     Family Communication: Daughter updated over the phone  Disposition: Status is:  Inpatient Remains inpatient appropriate because: Deconditioning,sacral decubitus,pleural effusions.  Planned Discharge Destination: Home with HHS    Time spent: 25 minutes  Author: Claretta CHRISTELLA Alderman, MD 06/09/2024 10:42 AM  For on call review [email or web address].

## 2024-06-10 DIAGNOSIS — L89154 Pressure ulcer of sacral region, stage 4: Secondary | ICD-10-CM | POA: Diagnosis not present

## 2024-06-10 DIAGNOSIS — R6520 Severe sepsis without septic shock: Secondary | ICD-10-CM | POA: Diagnosis not present

## 2024-06-10 DIAGNOSIS — E43 Unspecified severe protein-calorie malnutrition: Secondary | ICD-10-CM | POA: Diagnosis not present

## 2024-06-10 DIAGNOSIS — Z515 Encounter for palliative care: Secondary | ICD-10-CM | POA: Diagnosis not present

## 2024-06-10 DIAGNOSIS — A419 Sepsis, unspecified organism: Secondary | ICD-10-CM | POA: Diagnosis not present

## 2024-06-10 LAB — GLUCOSE, CAPILLARY
Glucose-Capillary: 138 mg/dL — ABNORMAL HIGH (ref 70–99)
Glucose-Capillary: 124 mg/dL — ABNORMAL HIGH (ref 70–99)
Glucose-Capillary: 231 mg/dL — ABNORMAL HIGH (ref 70–99)
Glucose-Capillary: 195 mg/dL — ABNORMAL HIGH (ref 70–99)
Glucose-Capillary: 147 mg/dL — ABNORMAL HIGH (ref 70–99)

## 2024-06-10 NOTE — Progress Notes (Signed)
 Daily Progress Note   Patient Name: Earl [name]       Date: 06/10/2024 DOB: October 09, 1941  Age: 83 y.o. MRN#: [id number] Attending Physician: Roann Gouty, MD Primary Care Physician: [name], MD Admit Date: 03/04/2024  Reason for Consultation/Follow-up: Establishing goals of care  Subjective: Medical records reviewed including progress notes, labs, imaging. Patient assessed at the bedside. He reports feeling fine and that his anxiety medication is still working quite well.   Created space and opportunity for patient's thoughts and feelings on patient's current illness. He is satisfied with his care plan and denies additional palliative needs at this time, though appreciative of the support.  Called Para and left voicemail with update on my visit and PMT contact information. She called back and shared she would like to speak with RD about dietary recommendations. I reached out to RD who stated she would return shortly to speak with Para. No other needs at this time.  Questions and concerns addressed. PMT will continue to support holistically.  Length of Stay: 11   Physical Exam Vitals and nursing note reviewed.  Constitutional:      General: He is not in acute distress.    Appearance: He is ill-appearing.  Cardiovascular:     Rate and Rhythm: Normal rate.  Pulmonary:     Effort: Tachypnea present.  Skin:    General: Skin is warm and dry.  Neurological:     Mental Status: He is alert. Mental status is at baseline.  Psychiatric:        Behavior: Behavior normal. Behavior is cooperative.            Vital Signs: BP 117/73 (BP Location: Left Arm)   Pulse 76   Temp 97.9 F (36.6 C)   Resp 19   Ht 5' 11 (1.803 m)   Wt 71.9 kg   SpO2 99%   BMI 22.11 kg/m  SpO2: SpO2: 99 % O2 Device: O2 Device: Room Air O2  Flow Rate:        Palliative Assessment/Data: 20% to 30%   Palliative Care Assessment & Plan   Patient Profile: Per intake H&P --> Patient is a 83 year old male with history of paroxysmal A-fib, insulin -dependent diabetes, hypertension, hyperlipidemia, CVA with residual right-sided weakness, MGUS, GERD, BPH, hypothyroidism, HFrEF with EF of 40 to 45%, chronic pancytopenia who presented to the emergency department from SNF with complaint of shortness of breath, wound, pain in the sacral area.    Palliative care evaluated Earl [name] earlier this month and have been asked to get re-involved for additional goals of care conversations.   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 Recent COVID infection Severe protein calorie malnutrition  Recommendations/Plan: Continue DNR/DNI Continue current care plan, goals of care remain consistent for medical optimization and return home to continue managing chronic illnesses Psychosocial and emotional support provided PMT will continue to follow and support intermittently   Prognosis: Concerning prognosis in light of recurrent hospitalizations, declining functional/nutritional status, and several chronic comorbidities  Discharge Planning: Home with Palliative Services   Care plan was discussed with patient, patient's daughter, RD   Earl [name]  Earl Fell, PA-C  Palliative Medicine Team Team phone # [phone]  Thank you for allowing the Palliative Medicine Team to assist in the care of this patient. Please utilize secure chat with additional questions, if there is no response within 30 minutes please call the above phone number.  Palliative Medicine Team providers are available by phone from 7am to 7pm daily and can be reached through the team cell phone.  Should this patient require assistance  outside of these hours, please call the patient's attending physician.    Time Total: 25  Visit consisted of counseling and education dealing with the complex and emotionally intense issues of symptom management and palliative care in the setting of serious and potentially life-threatening illness. Greater than 50% of this time was spent counseling and coordinating care related to the above assessment and plan.  Personally spent 25 minutes in patient care including extensive chart review (labs, imaging, progress/consult notes, vital signs), medically appropraite exam, discussed with treatment team, education to patient, family, and staff, documenting clinical information, medication review and management, coordination of care, and available advanced directive documents.

## 2024-06-10 NOTE — Plan of Care (Signed)
  Problem: Fluid Volume: Goal: Hemodynamic stability will improve Outcome: Progressing   Problem: Clinical Measurements: Goal: Diagnostic test results will improve Outcome: Progressing Goal: Signs and symptoms of infection will decrease Outcome: Progressing   Problem: Education: Goal: Knowledge of risk factors and measures for prevention of condition will improve Outcome: Progressing   Problem: Coping: Goal: Psychosocial and spiritual needs will be supported Outcome: Progressing   Problem: Respiratory: Goal: Will maintain a patent airway Outcome: Progressing Goal: Complications related to the disease process, condition or treatment will be avoided or minimized Outcome: Progressing   Problem: Education: Goal: Ability to describe self-care measures that may prevent or decrease complications (Diabetes Survival Skills Education) will improve Outcome: Progressing   Problem: Coping: Goal: Ability to adjust to condition or change in health will improve Outcome: Progressing   Problem: Fluid Volume: Goal: Ability to maintain a balanced intake and output will improve Outcome: Progressing   Problem: Health Behavior/Discharge Planning: Goal: Ability to manage health-related needs will improve Outcome: Progressing   Problem: Metabolic: Goal: Ability to maintain appropriate glucose levels will improve Outcome: Progressing   Problem: Nutritional: Goal: Maintenance of adequate nutrition will improve Outcome: Progressing Goal: Progress toward achieving an optimal weight will improve Outcome: Progressing   Problem: Skin Integrity: Goal: Risk for impaired skin integrity will decrease Outcome: Progressing   Problem: Tissue Perfusion: Goal: Adequacy of tissue perfusion will improve Outcome: Progressing   Problem: Education: Goal: Knowledge of General Education information will improve Description: Including pain rating scale, medication(s)/side effects and non-pharmacologic  comfort measures Outcome: Progressing   Problem: Health Behavior/Discharge Planning: Goal: Ability to manage health-related needs will improve Outcome: Progressing   Problem: Clinical Measurements: Goal: Ability to maintain clinical measurements within normal limits will improve Outcome: Progressing Goal: Will remain free from infection Outcome: Progressing Goal: Diagnostic test results will improve Outcome: Progressing Goal: Respiratory complications will improve Outcome: Progressing Goal: Cardiovascular complication will be avoided Outcome: Progressing   Problem: Activity: Goal: Risk for activity intolerance will decrease Outcome: Progressing   Problem: Nutrition: Goal: Adequate nutrition will be maintained Outcome: Progressing   Problem: Coping: Goal: Level of anxiety will decrease Outcome: Progressing   Problem: Elimination: Goal: Will not experience complications related to bowel motility Outcome: Progressing Goal: Will not experience complications related to urinary retention Outcome: Progressing   Problem: Pain Managment: Goal: General experience of comfort will improve and/or be controlled Outcome: Progressing   Problem: Safety: Goal: Ability to remain free from injury will improve Outcome: Progressing   Problem: Skin Integrity: Goal: Risk for impaired skin integrity will decrease Outcome: Progressing

## 2024-06-10 NOTE — Progress Notes (Signed)
 Progress Note   Patient: Earl [name] FMW:[id number] DOB: [birth year] DOA: 03/04/2024     98 DOS: the patient was seen and examined on 06/10/2024   Brief hospital course: 83 year old male with history of PAF on Eliquis , IDDM 2, HTN, CVA with right-sided residual weakness, MGUS, BPH, systolic CHF who is here with sacral pain. Of note, he was hospitalized 3/26-4/10 with generalized weakness/acute encephalopathy, hypotension, and was COVID PCR positive. He was eventually discharged to SNF and readmitted 4 days later with pain in the sacral area and shortness of breath. He was found to be in A-fib with RVR, tachypneic and was hypotensive on admission for which critical care was consulted. General surgery was consulted for sacral wound debridement status post bedside excision 4/15 and 03/07/2024.   Assessment and Plan:  No change in exam and care plan on date of service 06/10/2024.  Infected Sacral decubitus ulcer, stage IV Pressure injuries present on admission.  Wound type: Unstageable Pressure Injury: left shoulder Unstageable Pressure Injury: left trochanter Unstageable Pressure Injury; right heel Stage 4 Pressure Injury; sacrum   Severe sepsis secondary to pressure ulcers -He was treated with broad-spectrum antibiotics.   -Seen by infectious disease.  Completed antibiotic therapy.   -CT on 6/16 concern for possible osteomyelitis. Surgery consulted and performed wound debridement during this hospitalization.  -Plastic surgery was also consulted with recommendation for continued wound care and no surgical management. -Continue current wound care, continue pressure relieving therapies.   -7/11 Wound care reported more sloughing of the left hip wound,  -7/13: repeated CT: Similar appearance of osteomyelitis underlying sacral decubitus ulcer. The ulcer itself is less concave in appearance compared to prior. No new focal drainable fluid collection. Soft tissue irregularity overlying the LEFT  lateral hip without suspicious subjacent osseous changes. There is mild adjacent fascial enhancement likely reflecting reactive myositis. No focal drainable fluid collection noted. -Continue with wound care, currently, he remains afebrile without leukocytosis   Left trochanteric ulceration with yellow sloughing: Debulked by ortho at the bedside on 7/14, wound care on board. Skin protected to the hip with VAC drape for foam bridge    Acute on chronic pain: Continue oxycodone  as needed Continue Tylenol  as needed. LFT stable. Continue with Lidocaine  patch.    Long standing persistent atrial fibrillation:  Rate controlled with HR in the 60s to 70s Continue amiodarone  and Eliquis .     Acute on chronic combined heart failure Anasarca Continue Tafamidis  and vutrisiran . Continue with lasix    Right wrist pain: Improved. No evidence of fractures on wrist xray that was done on 7/15. He does have degenerative changes there.   Recurrent bilateral pleural effusion: Transudative Patient underwent left-sided thoracentesis on 6/18 yielded 600 mL of hazy amber fluid.  Body fluid culture with no growth. S/p b/l thoracentesis done on 7/14 by pulmonologist. It is Transudative so likely related to cardiomyopathy and poor nutritional status. Pulm on board   Anemia of chronic disease Hb stable at 11.8   Dysphagia Speech therapy on board. Diet recommendations: Thin liquid; Dysphagia 1 (puree)   Severe protein malnutrition Continue dietician recommendations, encourage diet, supplements. Albumin  still low at 2.2   Diabetes mellitus type 2, now with hypoglycemia. hemoglobin A1C of 8.4%.  CBGs still elevated to the 160s-180s Continue Semglee  6 units daily Change SSI from very sensitive to sensitive scale   DVT Prophylaxis: On eliquis    Overall prognosis is guarded.        Subjective: Patient complains of pain but nonspecific.  He stated that  after he ate his lunch he was full.  Physical  Exam: Vitals:   06/09/24 1646 06/09/24 2024 06/10/24 0714 06/10/24 1224  BP: 109/71 119/73 103/76 117/73  Pulse: 74 77 66 76  Resp:   18 19  Temp: (!) 97.4 F (36.3 C) (!) 97.4 F (36.3 C) 98.4 F (36.9 C) 97.9 F (36.6 C)  TempSrc:      SpO2: 100% 100% 97% 99%  Weight:      Height:       General: Chronically ill elderly man laying in bed. No acute distress. HEENT: [place]/AT. Anicteric sclera CV: RRR. No murmurs, rubs, or gallops. No LE edema Pulmonary: Lungs CTAB. Normal effort. No wheezing or rales. Abdominal: Soft, nontender, nondistended. Normal bowel sounds. Extremities: Palpable radial and DP pulses. Normal ROM. Skin: Warm and dry. Sacral decubitus ulcer, left trochanteric dressing in place. Neuro: A&Ox3. Moves all extremities. Normal sensation to light touch. No focal deficit. Psych: Normal mood and affect  Data Reviewed:  No new labs today  Family Communication: No family was available today to speak  Disposition: Status is: Inpatient Remains inpatient appropriate because: Ongoing recovery from deconditioning, sacral decubitus ulcer and pleural effusions.  Planned Discharge Destination: Home with Home Health    Time spent: 25 minutes  Author: Nena Rebel, MD 06/10/2024 2:10 PM  For on call review [email or web address].

## 2024-06-10 NOTE — Progress Notes (Signed)
 Nutrition Follow-up  DOCUMENTATION CODES:  Severe malnutrition in context of chronic illness  INTERVENTION:  Continue Magic Cup TID with meals, each supplement provides 290 kcal and 9 grams of protein Continue Ensure Plus High Protein po TID, each supplement provides 350 kcal and 20 grams of protein Continue vanilla Greek yogurt on evening tray Continue Juven BID to support wound healing Continue Multivitamin w/ minerals daily Recommend checking Vitamin A , Vitamin C , and Zinc  levels as pt has received supplementation for wound healing. Discontinue Vitamin A  and Vitamin C  supplementation  Start calorie count to assess pt PO intake for adequacy  NUTRITION DIAGNOSIS:  Severe Malnutrition related to chronic illness as evidenced by energy intake < or equal to 75% for > or equal to 1 month, severe muscle depletion. - Being addressed  GOAL:  Patient will meet greater than or equal to 90% of their needs - Ongoing   MONITOR:  PO intake, Supplement acceptance, Weight trends, Skin  REASON FOR ASSESSMENT:  Consult Assessment of nutrition requirement/status  ASSESSMENT:  83 year old male who presented to the ED on 03/04/24 from [facility] due to sacral pain. PMH of T2DM, HTN, HLD, CHF, atrial fibrillation, stroke with right-sided weakness, GERD, BPH, hypothyroidism, MGUS, transthyretin associated amyloid cardiomyopathy, chronic pancytopenia, recent hospital admission for COVID-19. Pt admitted with sepsis secondary to sacral pressure injury infection.  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 Discussed with RN, pt ate about 25% for breakfast and is still taking supplements. Pt PO intake continues to vary, recommend starting a calorie count to assess pt PO intake for adequate nutrition to support wound healing. RN aware of calorie count starting.   Recommend checking vitamin and mineral labs as pt has received supplementation. Discussed with MD. RD will discontinue current order for supplementation until labs result.   Meal Intake  7/17: 40% breakfast 7/18: 50% breakfast, 10% lunch, 100% dinner 7/19: 85% breakfast, 50% lunch 7/20: 75% breakfast, refused lunch, 10% dinner  Admission Weight: 68.5 kg Current Weight: 71.9 kg (7/20)  Nutrition Related Medications: Vitamin C , Calcium  Carbonate, Ferrous Sulfate , Lasix , NovoLog  0-9 units TID, Semglee , MVI, Miralax , Potassium Chloride , Senokot-S, Vitamin A  Labs: reviewed  CBG: 124-231 mg/dL x 24 hrs   UOP: 8499 mL x 24 hrs  Diet Order:   Diet Order             DIET - DYS 1 Room service appropriate? No; Fluid consistency: Thin  Diet effective now                  EDUCATION NEEDS: No education needs have been identified at this time  Skin:  Skin Assessment: Skin Integrity Issues: Per WOC note on 06/03/24: Unstageable Pressure Injury: left shoulder Unstageable Pressure Injury: left trochanter Unstageable Pressure Injury; right heel Stage 4 Pressure Injury; sacrum  Last BM:  7/20  Height:  Ht Readings from Last 1 Encounters:  03/04/24 5' 11 (1.803 m)   Weight:  Wt Readings from Last 1 Encounters:  06/09/24 71.9 kg   Ideal  Body Weight:  78.2 kg  BMI:  Body mass index is 22.11 kg/m.  Estimated Nutritional Needs:  Kcal:  2000-2200 Protein:  95-100 grams Fluid:  >2.0 L   Nestora Glatter RD, LDN Clinical Dietitian

## 2024-06-10 NOTE — Plan of Care (Signed)
  Problem: Coping: Goal: Psychosocial and spiritual needs will be supported Outcome: Progressing   Problem: Coping: Goal: Ability to adjust to condition or change in health will improve Outcome: Progressing

## 2024-06-11 DIAGNOSIS — A419 Sepsis, unspecified organism: Secondary | ICD-10-CM | POA: Diagnosis not present

## 2024-06-11 DIAGNOSIS — R6520 Severe sepsis without septic shock: Secondary | ICD-10-CM | POA: Diagnosis not present

## 2024-06-11 LAB — GLUCOSE, CAPILLARY
Glucose-Capillary: 118 mg/dL — ABNORMAL HIGH (ref 70–99)
Glucose-Capillary: 109 mg/dL — ABNORMAL HIGH (ref 70–99)
Glucose-Capillary: 217 mg/dL — ABNORMAL HIGH (ref 70–99)
Glucose-Capillary: 124 mg/dL — ABNORMAL HIGH (ref 70–99)
Glucose-Capillary: 139 mg/dL — ABNORMAL HIGH (ref 70–99)

## 2024-06-11 LAB — ZINC: Zinc: 54 ug/dL (ref 44–115)

## 2024-06-11 NOTE — Consult Note (Signed)
 WOC Nurse wound follow up Wound type: Unstageable Pressure Injury: left shoulder Unstageable Pressure Injury: left trochanter Unstageable Pressure Injury; right heel Stage 4 Pressure Injury; sacrum Measurement: Left shoulder: 3 cm x 2 cm  Left trochanter: 4 cm x 3 cm x 1 cm Right heel: 3 cm x 3cm Sacrum: 6 cm x 4 cm x 0.5 cm with 2 cm undermining from 12 o'clock to 3 o'clock Wound bed: Left shoulder: 1005 covered with dry eschar Left trochanter: 100% covered with yellow slough Right heel: 100% covered with black eschar Sacrum: pink/red wound bed with scattered yellow slough scant amount Drainage (amount, consistency, odor)  Left shoulder; none Left trochanter: serosanguinous in cannister Right heel: none Sacrum: purulent Periwound: intact  Dressing procedure/placement/frequency: 1- Left shoulder Unstageable   Paint with Betadine and leave open to air, daily.   2 - Sacral Stage 4 Pressure Injury  Cleanse with Vashe#[id number]. Apply Aquacel Ag+ (silver hydrofiber) Lawson # [phone] in the wound bed and fill the undermining using a swab, cover with sacral foam dressing, change daily and PRN soilage  3- Right heel; Unstageable; cleanse with saline, cover entire area with silver hydrofiber,(Aquacel ag+) Lawson # [phone]; top with foam. Change daily; offload with pillows or Prevalon boots at all times  4. L heel: Cover with foam dressing, change daily; offload with Prevalon boots    Both feet should be placed in Prevalon boots to offload pressure Soila [phone])    LALM in place for moisture management and pressure redistribution.  Left trochanter: Removed old NPWT dressing Placed new peel and place dressing  Sealed NPWT dressing at HG  Patient tolerated procedure well  WOC nurse will continue to provide NPWT dressing changed due to the complexity of the dressing change.   Attempted to reach daughter vie telephone to discuss visit, unable to reach at this time.  Will  re-attempt.  WOC Nurse team will follow with you and see patient within 10 days for wound assessments.  Please notify WOC nurses of any acute changes in the wounds or any new areas of concern   Doyal Polite, RN, MSN, [facility] WOC Team

## 2024-06-11 NOTE — Progress Notes (Addendum)
 Calorie Count Note  Limited information available for accurate calorie count. Pt able to communicate but tired easily, reports he had 100% of his breakfast but was not hungry for lunch. Lunch tray at bedside not touched. Pt had 1 Ensure today per RN. Even with limited information available pt seems to be doing adequate for his PO intake. Provided education on how to conduct a calorie count with RN. Hopefully tomorrow will provide a better insight into patients intake.   Vitamin labs not resulted yet.   48 hour calorie count ordered.  Diet: Dysphagia 1, thin liquids  Supplements: Magic cup, Ensure High Protein, Juven   Day 1: 7/21 Dinner: No meal ticket recorded 7/22: Breakfast: 100% eggs, waffle, grits, orange juice, pineapple (660 kcal, 17 gm protein) Lunch: 0%  Supplements: 2 Ensure, 1 Juven ( 845 kcal, 40 gm protein)  Total intake: 1,505 kcal (75% of minimum estimated needs)  57 gm protein (60% of minimum estimated needs)  Estimated Nutritional Needs:  Kcal:  2000-2200 Protein:  95-100 grams Fluid:  >2.0 L  INTERVENTION:  Continue Magic Cup TID with meals, each supplement provides 290 kcal and 9 grams of protein Continue Ensure Plus High Protein po TID, each supplement provides 350 kcal and 20 grams of protein Continue vanilla Greek yogurt on evening tray Continue Juven BID to support wound healing Continue Multivitamin w/ minerals daily Recommend checking Vitamin A , Vitamin C , and Zinc  levels as pt has received supplementation for wound healing. Discontinue Vitamin A  and Vitamin C  supplementation  Start calorie count to assess pt PO intake for adequacy Nursing to assist with feeding pt   NUTRITION DIAGNOSIS:  Severe Malnutrition related to chronic illness as evidenced by energy intake < or equal to 75% for > or equal to 1 month, severe muscle depletion. - Being addressed   GOAL:  Patient will meet greater than or equal to 90% of their needs - Ongoing   Olivia Kenning,  RD Registered Dietitian  See Amion for more information

## 2024-06-11 NOTE — Plan of Care (Signed)
   Problem: Clinical Measurements: Goal: Diagnostic test results will improve Outcome: Progressing

## 2024-06-11 NOTE — Progress Notes (Signed)
 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 He was just discharged from here to [facility] rehab 4 days ago. Hospital course was remarkable for acute metabolic encephalopathy, hypotension, hypoglycemia.  Found to have worsening sacral ulcers.  General surgery consulted.Status post bedside excisional debridement of the sacral wound on 4/15 and 4/17.  Prolonged hospitalization due to reluctance of family to take him home because of sacral ulcer.They are anticipating the ulcers to improve before they can take him home  Assessment & Plan:  Principal Problem:   Severe sepsis (HCC) Active Problems:   Paroxysmal atrial fibrillation (HCC)   Sepsis (HCC)   Atrial flutter (HCC)   Sacral ulcer (HCC)   MGUS (monoclonal gammopathy of unknown significance)   Essential hypertension   Hyperlipidemia   Insulin  dependent type 2 diabetes mellitus (HCC)   History of CVA (cerebrovascular accident)   Cardiac amyloidosis (HCC)   Chronic systolic CHF (congestive heart failure) (HCC)   GERD (gastroesophageal reflux disease)   History of hypertension   Atrial fibrillation with RVR (HCC)   Community acquired pneumonia   Protein-calorie malnutrition, severe   Wild-type transthyretin-related (ATTR) amyloidosis (HCC)   Sacral decubitus ulcer, stage IV (HCC)  Sepsis secondary to sacral pressure wound/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  Hypotensive,  encephalopathic, hypoglycemic, elevated lactate, leukocytosis on presentation.  Started on IV fluid, broad spectrum antibiotics.  Cultures:NGTD.  Completed antibiotics course. 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  General surgery consulted,   PT consulted for hydrotherapy. Started  zinc , vitamin C .Status post bedside excisional debridement of the sacral wound on 4/15 and 4/17.  Also seen by ID.  Completed antibiotics course. CT on 6/16 concern for possible osteomyelitis. Surgery consulted and performed wound debridement during this hospitalization. Plastic surgery was also consulted with recommendation for continued wound care and no surgical management.Continue current wound care, continue pressure relieving therapies. Wound care nurse following. 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 The ulcer itself is less concave in appearance compared to prior. No new focal drainable fluid collection.  Also has left trochanteric ulceration with yellow sloughing. Debulked by ortho at the bedside on 7/14, wound care on board. Skin protected to the hip with VAC drape for foam bridge    Permanent  A-fib: Presented with A-fib with RVR.   Cardiology was following.   Digoxin  discontinued.  Amiodarone  changed to oral.  Eliquis   restarted.  Currently rate is well-controlled:in and out of A-fib   History of HFrEF/transthyretin cardiac amyloidosis/nonischemic cardiomyopathy/hypotension:Continue Tafamidis  and vutrisiran .Continue with lasix   Recurrent bilateral pleural effusion: Underwent left-sided thoracentesis on 6/18.  Body fluid  culture without any growth.  Also underwent bilateral thoracentesis on 7/14 by pulmonology .transudative in nature   Insulin -dependent diabetes type 2: Presented with hyperglycemia.  Recent A1c  in the range of 8.  Diabetic coordinator consulted.  Currently on sliding scale, Lantus    History of CVA: Has a right-sided residual weakness.  On Eliquis , Lipitor.   GERD: Continue Protonix   Anemia of chronic disease: Continue iron supplementation   Hypothyroidism: Continue levothyroxine     Severe protein calorie malnutrition/suspicion for dysphagia/hypophosphatemia:   Dietitian was consulted.  Goals of care: Very deconditioned elderly male with bedbound status, multiple comorbidities, sacral pressure ulcer.  CODE STATUS DNR.  Palliative care consulted for goals of care.   PT/OT consulted.  Recommended SNF on discharge.  TOC following for placement      Nutrition Problem: Severe Malnutrition Etiology: chronic illness Pressure Injury 02/26/24 Coccyx Mid Stage 4 - Full thickness tissue loss with exposed bone, tendon or muscle. (Active)  02/26/24 0916  Location: Coccyx  Location Orientation: Mid  Staging: Stage 4 - Full thickness tissue loss with exposed bone, tendon or muscle.  Wound Description (Comments):   DO NOT USE:  Present on Admission: Yes  Dressing Type Foam - Lift dressing to assess site every shift 06/09/24 1025     Pressure Injury 02/26/24 Toe (Comment  which one) Anterior;Right Deep Tissue Pressure Injury - Purple or maroon localized area of discolored intact skin or blood-filled blister due to damage of underlying soft tissue from pressure and/or shear. right gre (Active)  02/26/24 1215  Location: Toe (Comment  which one)  Location Orientation: Anterior;Right  Staging: Deep Tissue Pressure Injury - Purple or maroon localized area of discolored intact skin or blood-filled blister due to damage of underlying soft tissue from pressure and/or shear.  Wound Description (Comments):  right great toe  DO NOT USE:  Present on Admission:   Dressing Type None 06/04/24 1723     Pressure Injury 03/06/24 Hip Anterior;Left;Proximal Unstageable - Full thickness tissue loss in which the base of the injury is covered by slough (yellow, tan, gray, green or brown) and/or eschar (tan, brown or black) in the wound bed. unstageable (Active)  03/06/24 1612  Location: Hip  Location Orientation: Anterior;Left;Proximal  Staging: Unstageable - Full thickness tissue loss in which the base of the injury is covered by slough (yellow, tan, gray, green or brown) and/or eschar (tan, brown or black) in the wound bed.  Wound Description (Comments): unstageable  DO NOT USE:  Present on Admission: No  Dressing Type Negative pressure wound therapy 06/09/24 1025     Pressure Injury 03/06/24 Shoulder Left Unstageable - Full thickness tissue loss in which the base of the injury is covered by slough (yellow, tan, gray, green or brown) and/or eschar (tan, brown or black) in the wound bed. unstageable (Active)  03/06/24 1613  Location: Shoulder  Location Orientation: Left  Staging: Unstageable - Full thickness tissue loss in which the base of the injury is covered by slough (yellow, tan, gray, green or brown) and/or eschar (tan, brown or black) in the wound bed.  Wound Description (Comments): unstageable  DO NOT USE:  Present on Admission: No  Dressing Type None 06/07/24 2000     Pressure Injury 04/23/24 Heel Right Unstageable - Full thickness tissue loss in which the base of the injury is covered by slough (yellow, tan, gray, green or brown) and/or eschar (tan, brown or black) in the wound bed. DTPI evolving into unstageable (Active)  04/23/24   Location: Heel  Location Orientation: Right  Staging: Unstageable - Full thickness tissue loss in which the base of the injury is covered by slough (yellow, tan, gray, green  or brown) and/or eschar (tan, brown or black) in the wound bed.  Wound Description  (Comments): DTPI evolving into unstageable  DO NOT USE:  Present on Admission: No  Dressing Type Foam - Lift dressing to assess site every shift 06/09/24 1025     Pressure Injury 04/23/24 Heel Left Deep Tissue Pressure Injury - Purple or maroon localized area of discolored intact skin or blood-filled blister due to damage of underlying soft tissue from pressure and/or shear. (Active)  04/23/24   Location: Heel  Location Orientation: Left  Staging: Deep Tissue Pressure Injury - Purple or maroon localized area of discolored intact skin or blood-filled blister due to damage of underlying soft tissue from pressure and/or shear.  Wound Description (Comments):   DO NOT USE:  Present on Admission: No  Dressing Type Foam - Lift dressing to assess site every shift 06/09/24 1025    DVT prophylaxis:SCDs Start: 03/04/24 2151 Place TED hose Start: 03/04/24 2151 apixaban  (ELIQUIS ) tablet 5 mg     Code Status: Limited: Do not attempt resuscitation (DNR) -DNR-LIMITED -Do Not Intubate/DNI   Family Communication: None at bedside  Patient status:Inpatient  Patient is from :home  Anticipated discharge to:not sure  Estimated DC date:not sure   Consultants: Cardiology, general surgery, PCCM  Procedures: Wound excision and debridement, thoracentesis  Antimicrobials:  Anti-infectives (From admission, onward)    Start     Dose/Rate Route Frequency Ordered Stop   04/27/24 1615  cefTAZidime  (FORTAZ ) 2 g in sodium chloride  0.9 % 100 mL IVPB        2 g 200 mL/hr over 30 Minutes Intravenous Every 8 hours 04/27/24 1518 05/03/24 2359   03/16/24 1015  cefTRIAXone  (ROCEPHIN ) 2 g in sodium chloride  0.9 % 100 mL IVPB        2 g 200 mL/hr over 30 Minutes Intravenous Every 24 hours 03/16/24 0929 03/20/24 1219   03/16/24 1015  doxycycline  (VIBRA -TABS) tablet 100 mg        100 mg Oral Every 12 hours 03/16/24 0929 03/20/24 2131   03/14/24 1000  metroNIDAZOLE  (FLAGYL ) tablet 500 mg        500 mg Oral Every 12  hours 03/14/24 0803 03/19/24 2219   03/13/24 1000  cefadroxil  (DURICEF) capsule 1,000 mg  Status:  Discontinued        1,000 mg Oral 2 times daily 03/12/24 1632 03/16/24 0929   03/07/24 2200  metroNIDAZOLE  (FLAGYL ) tablet 500 mg        500 mg Oral Every 12 hours 03/07/24 0840 03/11/24 2258   03/05/24 1930  vancomycin  (VANCOREADY) IVPB 1250 mg/250 mL  Status:  Discontinued        1,250 mg 166.7 mL/hr over 90 Minutes Intravenous Every 24 hours 03/04/24 2205 03/04/24 2224   03/05/24 1930  Vancomycin  (VANCOCIN ) 1,250 mg in sodium chloride  0.9 % 250 mL IVPB  Status:  Discontinued        1,250 mg 166.7 mL/hr over 90 Minutes Intravenous Every 24 hours 03/04/24 2224 03/05/24 0717   03/05/24 1930  vancomycin  (VANCOREADY) IVPB 1500 mg/300 mL  Status:  Discontinued        1,500 mg 150 mL/hr over 120 Minutes Intravenous Every 24 hours 03/05/24 0717 03/12/24 1636   03/05/24 1400  ceFEPIme  (MAXIPIME ) 2 g in sodium chloride  0.9 % 100 mL IVPB  Status:  Discontinued        2 g 200 mL/hr over 30 Minutes Intravenous Every 8 hours 03/05/24 0717 03/12/24 1632   03/05/24 0600  ceFEPIme  (MAXIPIME ) 2 g in sodium chloride  0.9 % 100 mL IVPB  Status:  Discontinued        2 g 200 mL/hr over 30 Minutes Intravenous Every 12 hours 03/04/24 2205 03/05/24 0717   03/05/24 0000  metroNIDAZOLE  (FLAGYL ) IVPB 500 mg  Status:  Discontinued        500 mg 100 mL/hr over 60 Minutes Intravenous Every 12 hours 03/04/24 2206 03/04/24 2209   03/04/24 1730  aztreonam (AZACTAM) 2 g in sodium chloride  0.9 % 100 mL IVPB  Status:  Discontinued        2 g 200 mL/hr over 30 Minutes Intravenous  Once 03/04/24 1715 03/04/24 1724   03/04/24 1730  metroNIDAZOLE  (FLAGYL ) IVPB 500 mg  Status:  Discontinued        500 mg 100 mL/hr over 60 Minutes Intravenous Every 12 hours 03/04/24 1715 03/07/24 0840   03/04/24 1730  vancomycin  (VANCOREADY) IVPB 1500 mg/300 mL        1,500 mg 150 mL/hr over 120 Minutes Intravenous  Once 03/04/24 1715 03/04/24  2153   03/04/24 1730  ceFEPIme  (MAXIPIME ) 2 g in sodium chloride  0.9 % 100 mL IVPB        2 g 200 mL/hr over 30 Minutes Intravenous  Once 03/04/24 1724 03/04/24 1826       Subjective: Patient seen and examined at bedside today.  Hemodynamically stable lying comfortably in bed.  Alert and oriented.  Denies any new complaints.  Weak and chronically deconditioned  Objective: Vitals:   06/10/24 2332 06/11/24 0100 06/11/24 0407 06/11/24 0815  BP: 116/67  113/77 112/66  Pulse: 76  76 75  Resp:   18 16  Temp: 97.7 F (36.5 C)  97.6 F (36.4 C) (!) 97.5 F (36.4 C)  TempSrc:    Oral  SpO2: 100%  99% 100%  Weight:  63 kg    Height:        Intake/Output Summary (Last 24 hours) at 06/11/2024 1112 Last data filed at 06/11/2024 0917 Gross per 24 hour  Intake 774 ml  Output 1900 ml  Net -1126 ml   Filed Weights   06/03/24 0500 06/09/24 0500 06/11/24 0100  Weight: 72.6 kg 71.9 kg 63 kg    Examination:   General exam: Overall comfortable, not in distress, chronically deconditioned/weak,malnourished HEENT: PERRL Respiratory system:  no wheezes or crackles  Cardiovascular system: S1 & S2 heard, RRR.  Gastrointestinal system: Abdomen is nondistended, soft and nontender. Central nervous system: Alert and oriented Extremities: No edema, no clubbing ,no cyanosis Skin:pressure ulcers as above    Data Reviewed: I have personally reviewed following labs and imaging studies  CBC: Recent Labs  Lab 06/09/24 0336  WBC 5.0  HGB 11.8*  HCT 35.0*  MCV 85.8  PLT 193   Basic Metabolic Panel: Recent Labs  Lab 06/09/24 0336  NA 136  K 4.3  CL 105  CO2 23  GLUCOSE 177*  BUN 27*  CREATININE 0.86  CALCIUM  8.4*     Recent Results (from the past 240 hours)  Body fluid culture w Gram Stain     Status: None   Collection Time: 06/03/24  1:43 PM   Specimen: Pleural Fluid  Result Value Ref Range Status   Specimen Description PLEURAL  Final   Special Requests LEFT  Final   Gram  Stain NO WBC SEEN NO ORGANISMS SEEN   Final   Culture   Final    NO GROWTH 3 DAYS Performed at [facility]  [facility], [address], [place], [place] [address]    Report Status 06/07/2024 FINAL  Final  Body fluid culture w Gram Stain     Status: None   Collection Time: 06/03/24  1:44 PM   Specimen: Pleural Fluid  Result Value Ref Range Status   Specimen Description PLEURAL  Final   Special Requests RIGHT LUNG  Final   Gram Stain NO WBC SEEN NO ORGANISMS SEEN   Final   Culture   Final    NO GROWTH 3 DAYS Performed at [facility], [address] [place], [place] [address]    Report Status 06/07/2024 FINAL  Final     Radiology Studies: No results found.  Scheduled Meds:  amiodarone   200 mg Oral Daily   apixaban   5 mg Oral BID   atorvastatin   40 mg Oral Daily   calcium  carbonate  1 tablet Oral TID   Chlorhexidine  Gluconate Cloth  6 each Topical Daily   collagenase    Topical Daily   diclofenac  Sodium  4 g Topical QID   feeding supplement  237 mL Oral TID BM   ferrous sulfate   325 mg Oral QHS   furosemide   20 mg Oral Daily   guaiFENesin   600 mg Oral BID   insulin  aspart  0-9 Units Subcutaneous TID WC   insulin  glargine-yfgn  6 Units Subcutaneous Daily   levothyroxine   50 mcg Oral Q0600   lidocaine   1 patch Transdermal Q24H   liver oil-zinc  oxide   Topical BID   multivitamin with minerals  1 tablet Oral Daily   nutrition supplement (JUVEN)  1 packet Oral BID BM   mouth rinse  15 mL Mouth Rinse 4 times per day   polyethylene glycol  17 g Oral BID   potassium chloride   20 mEq Oral Daily   senna-docusate  1 tablet Oral QHS   sertraline   50 mg Oral Daily   sodium chloride  flush  10-40 mL Intracatheter Q12H   Tafamidis   1 capsule Oral Daily   vutrisiran  sodium  25 mg Subcutaneous Q90 days   Continuous Infusions:  dextrose        LOS: 99 days   Ivonne Mustache, MD [facility] P7/22/2025, 11:12 AM

## 2024-06-11 NOTE — Progress Notes (Signed)
 Physical Therapy Treatment Patient Details Name: Earl [name] MRN: [id number] DOB: 1941/05/29 Today's Date: 06/11/2024   History of Present Illness 83 y.o. male admitted from [facility] SNF rehab on 03/04/24 with SOB, sacral pain, Afib with RVR. Pt with acute metabolic encephalopathy, rhabdomyolysis and sepsis secondary to sacral wound infection. 4/15 & 4/17 bedside sacral wound debridement. Pt also with B pleural effusion, 6/18 L thoracentesis, 7/14 L & R thoracentesis.  PMhx: adm 3/26-4/08/2024 with fall, AMS, COVID; d/c to SNF rehab. CVA (2021, residual R-side weakness), T2DM, HTN, CHF, MGUS, Afib on Eliquis .    PT Comments  Pt's tolerance for mobility is limited today by reports of pain in the area of his sacral wound. Pain appears to be more significant with transitions from standing to sitting. Pt continues to require significant assistance for all functional mobility and remains at a high risk for falls, with only brief periods of standing with incomplete hip/knee extension.  Patient will benefit from continued inpatient follow up therapy, <3 hours/day.   If plan is discharge home, recommend the following: Two people to help with walking and/or transfers;Two people to help with bathing/dressing/bathroom;Assistance with feeding;Assist for transportation   Can travel by private vehicle     No  Equipment Recommendations  Hospital bed;Hoyer lift;Wheelchair cushion (measurements PT);Wheelchair (measurements PT) (air mattress, roho cushion)    Recommendations for Other Services       Precautions / Restrictions Precautions Precautions: Fall Recall of Precautions/Restrictions: Impaired Precaution/Restrictions Comments: sacral wound; L hip wound with wound vac, bilat heel wounds; L shoulder wound Restrictions Weight Bearing Restrictions Per Provider Order: No     Mobility  Bed Mobility Overal bed mobility: Needs Assistance Bed Mobility: Rolling, Supine to Sit, Sit to  Supine Rolling: Min assist, Mod assist   Supine to sit: Max assist, HOB elevated Sit to supine: Max assist, HOB elevated        Transfers Overall transfer level: Needs assistance Equipment used: 1 person hand held assist Transfers: Sit to/from Stand Sit to Stand: Max assist           General transfer comment: pt attempts 2 sit to stands with R knee block and face to face assistance. initial attempt pt clears buttocks from bed but is unable to ascend into standing. 2nd attempt pt demonstrates a lack of full knee extension with near complete hip extension to neutral.    Ambulation/Gait                   Stairs             Wheelchair Mobility     Tilt Bed    Modified Rankin (Stroke Patients Only)       Balance Overall balance assessment: Needs assistance Sitting-balance support: Single extremity supported, Bilateral upper extremity supported, Feet supported Sitting balance-Leahy Scale: Poor Sitting balance - Comments: minA Postural control: Posterior lean Standing balance support: Bilateral upper extremity supported Standing balance-Leahy Scale: Zero Standing balance comment: maxA                            Communication Communication Communication: Impaired Factors Affecting Communication: Reduced clarity of speech;Difficulty expressing self  Cognition Arousal: Alert Behavior During Therapy: WFL for tasks assessed/performed   PT - Cognitive impairments: Problem solving Difficult to assess due to: Impaired communication                       Following commands: Intact  Cueing Cueing Techniques: Verbal cues, Tactile cues, Visual cues  Exercises Other Exercises Other Exercises: PT encourages perofrmance of ankle pumps, heel slides and hip abduction/adduction    General Comments General comments (skin integrity, edema, etc.): VSS on RA      Pertinent Vitals/Pain Pain Assessment Pain Assessment: Faces Faces Pain  Scale: Hurts whole lot Pain Location: buttocks with stand to sit Pain Descriptors / Indicators: Moaning Pain Intervention(s): Monitored during session    Home Living                          Prior Function            PT Goals (current goals can now be found in the care plan section) Acute Rehab PT Goals Patient Stated Goal: move better Progress towards PT goals: Not progressing toward goals - comment (limited by generalized weakness)    Frequency    Min 1X/week      PT Plan      Co-evaluation              AM-PAC PT 6 Clicks Mobility   Outcome Measure  Help needed turning from your back to your side while in a flat bed without using bedrails?: A Lot Help needed moving from lying on your back to sitting on the side of a flat bed without using bedrails?: A Lot Help needed moving to and from a bed to a chair (including a wheelchair)?: Total Help needed standing up from a chair using your arms (e.g., wheelchair or bedside chair)?: Total Help needed to walk in hospital room?: Total Help needed climbing 3-5 steps with a railing? : Total 6 Click Score: 8    End of Session   Activity Tolerance: Patient limited by fatigue Patient left: in bed;with call bell/phone within reach Nurse Communication: Mobility status;Need for lift equipment PT Visit Diagnosis: Muscle weakness (generalized) (M62.81);Adult, failure to thrive (R62.7);Other abnormalities of gait and mobility (R26.89);Unsteadiness on feet (R26.81);Difficulty in walking, not elsewhere classified (R26.2) Pain - Right/Left: Left Pain - part of body: Knee     Time: 1324-1350 PT Time Calculation (min) (ACUTE ONLY): 26 min  Charges:    $Therapeutic Activity: 23-37 mins PT General Charges $$ ACUTE PT VISIT: 1 Visit                     Bernardino JINNY Ruth, PT, DPT Acute Rehabilitation Office [phone]    Bernardino JINNY Ruth 06/11/2024, 3:08 PM

## 2024-06-12 DIAGNOSIS — A419 Sepsis, unspecified organism: Secondary | ICD-10-CM | POA: Diagnosis not present

## 2024-06-12 DIAGNOSIS — R6520 Severe sepsis without septic shock: Secondary | ICD-10-CM | POA: Diagnosis not present

## 2024-06-12 LAB — GLUCOSE, CAPILLARY
Glucose-Capillary: 190 mg/dL — ABNORMAL HIGH (ref 70–99)
Glucose-Capillary: 148 mg/dL — ABNORMAL HIGH (ref 70–99)
Glucose-Capillary: 225 mg/dL — ABNORMAL HIGH (ref 70–99)
Glucose-Capillary: 171 mg/dL — ABNORMAL HIGH (ref 70–99)
Glucose-Capillary: 199 mg/dL — ABNORMAL HIGH (ref 70–99)

## 2024-06-12 LAB — VITAMIN A: Vitamin A (Retinoic Acid): 6.9 ug/dL — ABNORMAL LOW (ref 22.0–69.5)

## 2024-06-12 MED ORDER — BISACODYL 10 MG RE SUPP
10.0000 mg | Freq: Once | RECTAL | Status: AC
  Administered 2024-06-12: 10 mg via RECTAL
  Filled 2024-06-12: qty 1

## 2024-06-12 NOTE — Plan of Care (Signed)
 Patient progressing at this time.

## 2024-06-12 NOTE — Progress Notes (Signed)
 PROGRESS NOTE  Earl [name]  FMW:[id number] DOB: Jan 21, 1941 DOA: 03/04/2024 PCP: [name], MD   Brief Narrative: Patient is a 83 year old male with history of paroxysmal A-fib, insulin -dependent diabetes, hypertension, hyperlipidemia, CVA with residual right-sided weakness, MGUS, GERD, BPH, hypothyroidism, HFrEF with EF of 40 to 45%, chronic pancytopenia who presented to the emergency department from SNF with complaint of shortness of breath, wound, pain in the sacral area. He was just discharged from here to [facility] rehab 4 days ago. Hospital course was remarkable for acute metabolic encephalopathy, hypotension, hypoglycemia.  Found to have worsening sacral ulcers.  General surgery consulted.Status post bedside excisional debridement of the sacral wound on 4/15 and 4/17.  Prolonged hospitalization due to reluctance of family to take him home because of pressure ulcers.They are anticipating the ulcers to improve before they can take him home  Assessment & Plan:  Principal Problem:   Severe sepsis (HCC) Active Problems:   Paroxysmal atrial fibrillation (HCC)   Sepsis (HCC)   Atrial flutter (HCC)   Sacral ulcer (HCC)   MGUS (monoclonal gammopathy of unknown significance)   Essential hypertension   Hyperlipidemia   Insulin  dependent type 2 diabetes mellitus (HCC)   History of CVA (cerebrovascular accident)   Cardiac amyloidosis (HCC)   Chronic systolic CHF (congestive heart failure) (HCC)   GERD (gastroesophageal reflux disease)   History of hypertension   Atrial fibrillation with RVR (HCC)   Community acquired pneumonia   Protein-calorie malnutrition, severe   Wild-type transthyretin-related (ATTR) amyloidosis (HCC)   Sacral decubitus ulcer, stage IV (HCC)  Sepsis secondary to sacral pressure wound/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  Hypotensive,  encephalopathic, hypoglycemic, elevated lactate, leukocytosis on presentation.  Started on IV fluid, broad spectrum antibiotics.  Cultures:NGTD.  Completed antibiotics course. 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  General surgery consulted,   PT consulted for hydrotherapy. Started  zinc , vitamin C .Status post bedside excisional debridement of the sacral wound on 4/15 and 4/17.  Also seen by ID.  Completed antibiotics course. CT on 6/16 concern for possible osteomyelitis. Surgery consulted and performed wound debridement during this hospitalization. Plastic surgery was also consulted with recommendation for continued wound care and no surgical management.Continue current wound care, continue pressure relieving therapies. Wound care nurse following. 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 The ulcer itself is less concave in appearance compared to prior. No new focal drainable fluid collection.  Also has left trochanteric ulceration with yellow sloughing. Debulked by ortho at the bedside on 7/14, wound care on board. Skin protected to the hip with VAC drape for foam bridge    Permanent  A-fib: Presented with A-fib with RVR.   Cardiology was following.   Digoxin  discontinued.  Amiodarone  changed to oral.  Eliquis   restarted.  Currently rate is well-controlled:in and out of A-fib   History of HFrEF/transthyretin cardiac amyloidosis/nonischemic cardiomyopathy/hypotension:Continue Tafamidis  and vutrisiran .Continue with lasix   Recurrent bilateral pleural effusion: Underwent left-sided thoracentesis on 6/18.  Body fluid  culture without any growth.  Also underwent bilateral thoracentesis on 7/14 by pulmonology .transudative in nature   Insulin -dependent diabetes type 2: Presented with hyperglycemia.  Recent A1c  in the range of 8.  Diabetic coordinator consulted.  Currently on sliding scale, Lantus    History of CVA: Has a right-sided residual weakness.  On Eliquis , Lipitor.   GERD: Continue Protonix   Anemia of chronic disease: Continue iron supplementation   Hypothyroidism: Continue levothyroxine     Severe protein calorie malnutrition/suspicion for dysphagia/hypophosphatemia:   Dietitian was consulted.  Goals of care: Very deconditioned elderly male with bedbound status, multiple comorbidities, sacral pressure ulcer.  CODE STATUS DNR.  Palliative care consulted for goals of care.   PT/OT consulted.  Recommended SNF on discharge.  TOC following for placement      Nutrition Problem: Severe Malnutrition Etiology: chronic illness Pressure Injury 02/26/24 Coccyx Mid Stage 4 - Full thickness tissue loss with exposed bone, tendon or muscle. (Active)  02/26/24 0916  Location: Coccyx  Location Orientation: Mid  Staging: Stage 4 - Full thickness tissue loss with exposed bone, tendon or muscle.  Wound Description (Comments):   DO NOT USE:  Present on Admission: Yes  Dressing Type Foam - Lift dressing to assess site every shift;Silver hydrofiber 06/11/24 0800     Pressure Injury 02/26/24 Toe (Comment  which one) Anterior;Right Deep Tissue Pressure Injury - Purple or maroon localized area of discolored intact skin or blood-filled blister due to damage of underlying soft tissue from pressure and/or shear. right gre (Active)  02/26/24 1215  Location: Toe (Comment  which one)  Location Orientation: Anterior;Right  Staging: Deep Tissue Pressure Injury - Purple or maroon localized area of discolored intact skin or blood-filled blister due to damage of underlying soft tissue from pressure and/or shear.  Wound  Description (Comments): right great toe  DO NOT USE:  Present on Admission:   Dressing Type Foam - Lift dressing to assess site every shift 06/11/24 0800     Pressure Injury 03/06/24 Hip Anterior;Left;Proximal Unstageable - Full thickness tissue loss in which the base of the injury is covered by slough (yellow, tan, gray, green or brown) and/or eschar (tan, brown or black) in the wound bed. unstageable (Active)  03/06/24 1612  Location: Hip  Location Orientation: Anterior;Left;Proximal  Staging: Unstageable - Full thickness tissue loss in which the base of the injury is covered by slough (yellow, tan, gray, green or brown) and/or eschar (tan, brown or black) in the wound bed.  Wound Description (Comments): unstageable  DO NOT USE:  Present on Admission: No  Dressing Type Negative pressure wound therapy 06/11/24 0800     Pressure Injury 03/06/24 Shoulder Left Unstageable - Full thickness tissue loss in which the base of the injury is covered by slough (yellow, tan, gray, green or brown) and/or eschar (tan, brown or black) in the wound bed. unstageable (Active)  03/06/24 1613  Location: Shoulder  Location Orientation: Left  Staging: Unstageable - Full thickness tissue loss in which the base of the injury is covered by slough (yellow, tan, gray, green or brown) and/or eschar (tan, brown or black) in the wound bed.  Wound Description (Comments): unstageable  DO NOT USE:  Present on Admission: No  Dressing Type Foam - Lift dressing to assess site every shift 06/11/24 0800     Pressure Injury 04/23/24 Heel Right Unstageable - Full thickness tissue loss in which the base of the injury is covered by slough (yellow, tan, gray, green or brown) and/or eschar (tan, brown or black) in the wound bed. DTPI evolving into unstageable (Active)  04/23/24   Location: Heel  Location Orientation: Right  Staging: Unstageable - Full thickness  tissue loss in which the base of the injury is covered by slough  (yellow, tan, gray, green or brown) and/or eschar (tan, brown or black) in the wound bed.  Wound Description (Comments): DTPI evolving into unstageable  DO NOT USE:  Present on Admission: No  Dressing Type Foam - Lift dressing to assess site every shift 06/11/24 0800     Pressure Injury 04/23/24 Heel Left Deep Tissue Pressure Injury - Purple or maroon localized area of discolored intact skin or blood-filled blister due to damage of underlying soft tissue from pressure and/or shear. (Active)  04/23/24   Location: Heel  Location Orientation: Left  Staging: Deep Tissue Pressure Injury - Purple or maroon localized area of discolored intact skin or blood-filled blister due to damage of underlying soft tissue from pressure and/or shear.  Wound Description (Comments):   DO NOT USE:  Present on Admission: No  Dressing Type Foam - Lift dressing to assess site every shift 06/11/24 0800    DVT prophylaxis:SCDs Start: 03/04/24 2151 Place TED hose Start: 03/04/24 2151 apixaban  (ELIQUIS ) tablet 5 mg     Code Status: Limited: Do not attempt resuscitation (DNR) -DNR-LIMITED -Do Not Intubate/DNI   Family Communication: None at bedside  Patient status:Inpatient  Patient is from :home  Anticipated discharge to:not sure  Estimated DC date:not sure   Consultants: Cardiology, general surgery, PCCM  Procedures: Wound excision and debridement, thoracentesis  Antimicrobials:  Anti-infectives (From admission, onward)    Start     Dose/Rate Route Frequency Ordered Stop   04/27/24 1615  cefTAZidime  (FORTAZ ) 2 g in sodium chloride  0.9 % 100 mL IVPB        2 g 200 mL/hr over 30 Minutes Intravenous Every 8 hours 04/27/24 1518 05/03/24 2359   03/16/24 1015  cefTRIAXone  (ROCEPHIN ) 2 g in sodium chloride  0.9 % 100 mL IVPB        2 g 200 mL/hr over 30 Minutes Intravenous Every 24 hours 03/16/24 0929 03/20/24 1219   03/16/24 1015  doxycycline  (VIBRA -TABS) tablet 100 mg        100 mg Oral Every 12 hours  03/16/24 0929 03/20/24 2131   03/14/24 1000  metroNIDAZOLE  (FLAGYL ) tablet 500 mg        500 mg Oral Every 12 hours 03/14/24 0803 03/19/24 2219   03/13/24 1000  cefadroxil  (DURICEF) capsule 1,000 mg  Status:  Discontinued        1,000 mg Oral 2 times daily 03/12/24 1632 03/16/24 0929   03/07/24 2200  metroNIDAZOLE  (FLAGYL ) tablet 500 mg        500 mg Oral Every 12 hours 03/07/24 0840 03/11/24 2258   03/05/24 1930  vancomycin  (VANCOREADY) IVPB 1250 mg/250 mL  Status:  Discontinued        1,250 mg 166.7 mL/hr over 90 Minutes Intravenous Every 24 hours 03/04/24 2205 03/04/24 2224   03/05/24 1930  Vancomycin  (VANCOCIN ) 1,250 mg in sodium chloride  0.9 % 250 mL IVPB  Status:  Discontinued        1,250 mg 166.7 mL/hr over 90 Minutes Intravenous Every 24 hours 03/04/24 2224 03/05/24 0717   03/05/24 1930  vancomycin  (VANCOREADY) IVPB 1500 mg/300 mL  Status:  Discontinued        1,500 mg 150 mL/hr over 120 Minutes Intravenous Every 24 hours 03/05/24 0717 03/12/24 1636   03/05/24 1400  ceFEPIme  (MAXIPIME ) 2 g in sodium chloride  0.9 % 100 mL IVPB  Status:  Discontinued        2 g 200  mL/hr over 30 Minutes Intravenous Every 8 hours 03/05/24 0717 03/12/24 1632   03/05/24 0600  ceFEPIme  (MAXIPIME ) 2 g in sodium chloride  0.9 % 100 mL IVPB  Status:  Discontinued        2 g 200 mL/hr over 30 Minutes Intravenous Every 12 hours 03/04/24 2205 03/05/24 0717   03/05/24 0000  metroNIDAZOLE  (FLAGYL ) IVPB 500 mg  Status:  Discontinued        500 mg 100 mL/hr over 60 Minutes Intravenous Every 12 hours 03/04/24 2206 03/04/24 2209   03/04/24 1730  aztreonam (AZACTAM) 2 g in sodium chloride  0.9 % 100 mL IVPB  Status:  Discontinued        2 g 200 mL/hr over 30 Minutes Intravenous  Once 03/04/24 1715 03/04/24 1724   03/04/24 1730  metroNIDAZOLE  (FLAGYL ) IVPB 500 mg  Status:  Discontinued        500 mg 100 mL/hr over 60 Minutes Intravenous Every 12 hours 03/04/24 1715 03/07/24 0840   03/04/24 1730  vancomycin   (VANCOREADY) IVPB 1500 mg/300 mL        1,500 mg 150 mL/hr over 120 Minutes Intravenous  Once 03/04/24 1715 03/04/24 2153   03/04/24 1730  ceFEPIme  (MAXIPIME ) 2 g in sodium chloride  0.9 % 100 mL IVPB        2 g 200 mL/hr over 30 Minutes Intravenous  Once 03/04/24 1724 03/04/24 1826       Subjective: Patient seen and examined at the bedside today.  Hemodynamically stable.  Lying in bed.  Eating his breakfast.  Does not look in any kind of distress.  Complains of some headache.  Chronically deconditioned but overall appears comfortable  Objective: Vitals:   06/11/24 2040 06/12/24 0006 06/12/24 0601 06/12/24 0801  BP: 115/72 111/64 (!) 163/94 120/82  Pulse: 79 69 89 77  Resp:  17  18  Temp: 98 F (36.7 C) 97.9 F (36.6 C) 98.3 F (36.8 C) 97.7 F (36.5 C)  TempSrc: Axillary  Oral Oral  SpO2: 99% 100% 95% 98%  Weight:   63.5 kg   Height:        Intake/Output Summary (Last 24 hours) at 06/12/2024 1141 Last data filed at 06/12/2024 1024 Gross per 24 hour  Intake 236 ml  Output 1600 ml  Net -1364 ml   Filed Weights   06/09/24 0500 06/11/24 0100 06/12/24 0601  Weight: 71.9 kg 63 kg 63.5 kg    Examination:   General exam: Overall comfortable, not in distress, chronically deconditioned/weak, malnourished HEENT: PERRL Respiratory system:  no wheezes or crackles  Cardiovascular system: S1 & S2 heard, RRR.  Gastrointestinal system: Abdomen is nondistended, soft and nontender. Central nervous system: Alert and oriented Extremities: No edema, no clubbing ,no cyanosis Skin: pressure ulcers as above   Data Reviewed: I have personally reviewed following labs and imaging studies  CBC: Recent Labs  Lab 06/09/24 0336  WBC 5.0  HGB 11.8*  HCT 35.0*  MCV 85.8  PLT 193   Basic Metabolic Panel: Recent Labs  Lab 06/09/24 0336  NA 136  K 4.3  CL 105  CO2 23  GLUCOSE 177*  BUN 27*  CREATININE 0.86  CALCIUM  8.4*     Recent Results (from the past 240 hours)  Body  fluid culture w Gram Stain     Status: None   Collection Time: 06/03/24  1:43 PM   Specimen: Pleural Fluid  Result Value Ref Range Status   Specimen Description PLEURAL  Final   Special  Requests LEFT  Final   Gram Stain NO WBC SEEN NO ORGANISMS SEEN   Final   Culture   Final    NO GROWTH 3 DAYS Performed at [facility], [address] [place], [place] [address]    Report Status 06/07/2024 FINAL  Final  Body fluid culture w Gram Stain     Status: None   Collection Time: 06/03/24  1:44 PM   Specimen: Pleural Fluid  Result Value Ref Range Status   Specimen Description PLEURAL  Final   Special Requests RIGHT LUNG  Final   Gram Stain NO WBC SEEN NO ORGANISMS SEEN   Final   Culture   Final    NO GROWTH 3 DAYS Performed at [facility], [address] [place], [place] [address]    Report Status 06/07/2024 FINAL  Final     Radiology Studies: No results found.  Scheduled Meds:  amiodarone   200 mg Oral Daily   apixaban   5 mg Oral BID   atorvastatin   40 mg Oral Daily   calcium  carbonate  1 tablet Oral TID   Chlorhexidine  Gluconate Cloth  6 each Topical Daily   collagenase    Topical Daily   diclofenac  Sodium  4 g Topical QID   feeding supplement  237 mL Oral TID BM   ferrous sulfate   325 mg Oral QHS   furosemide   20 mg Oral Daily   guaiFENesin   600 mg Oral BID   insulin  aspart  0-9 Units Subcutaneous TID WC   insulin  glargine-yfgn  6 Units Subcutaneous Daily   levothyroxine   50 mcg Oral Q0600   lidocaine   1 patch Transdermal Q24H   liver oil-zinc  oxide   Topical BID   multivitamin with minerals  1 tablet Oral Daily   nutrition supplement (JUVEN)  1 packet Oral BID BM   mouth rinse  15 mL Mouth Rinse 4 times per day   polyethylene glycol  17 g Oral BID   potassium chloride   20 mEq Oral Daily   senna-docusate  1 tablet Oral QHS   sertraline   50 mg Oral Daily   sodium chloride  flush  10-40 mL Intracatheter Q12H   Tafamidis   1 capsule Oral Daily   vutrisiran   sodium  25 mg Subcutaneous Q90 days   Continuous Infusions:  dextrose        LOS: 100 days   Ivonne Mustache, MD [facility] P7/23/2025, 11:41 AM

## 2024-06-12 NOTE — Progress Notes (Signed)
 Occupational Therapy Treatment Patient Details Name: Earl [name] MRN: [id number] DOB: 1941-06-07 Today's Date: 06/12/2024   History of present illness 83 y.o. male admitted from [facility] rehab on 03/04/24 with SOB, sacral pain, Afib with RVR. Pt with acute metabolic encephalopathy, rhabdomyolysis and sepsis secondary to sacral wound infection. 4/15 & 4/17 bedside sacral wound debridement. Pt also with B pleural effusion, 6/18 L thoracentesis, 7/14 L & R thoracentesis.  PMhx: adm 3/26-4/08/2024 with fall, AMS, COVID; d/c to SNF rehab. CVA (2021, residual R-side weakness), T2DM, HTN, CHF, MGUS, Afib on Eliquis .   OT comments  Patient received in supine and agreeable to OT treatment and for transfer to recliner. Patient continues to require max assist for supine to sitting on EOB. From EOB patient required mod assist +2 for sit to stand with use of bed pads into Stedy and transfer to recliner. Patient able to perform grooming tasks while seated in recliner and UE HEP with level one therapy band. Patient left in recliner and stated comfort. Patient will benefit from continued inpatient follow up therapy, <3 hours/day. Acute OT to continue to follow to address established goals to facilitate DC to next venue of care.        If plan is discharge home, recommend the following:  A lot of help with walking and/or transfers;A lot of help with bathing/dressing/bathroom;Assistance with cooking/housework;Assist for transportation;Help with stairs or ramp for entrance;Two people to help with walking and/or transfers;Assistance with feeding   Equipment Recommendations  Other (comment) (defer)    Recommendations for Other Services      Precautions / Restrictions Precautions Precautions: Fall Recall of Precautions/Restrictions: Impaired Precaution/Restrictions Comments: sacral wound; L hip wound with wound vac, bilat heel wounds; L shoulder wound Restrictions Weight Bearing Restrictions Per  Provider Order: No       Mobility Bed Mobility Overal bed mobility: Needs Assistance Bed Mobility: Supine to Sit     Supine to sit: Max assist, HOB elevated     General bed mobility comments: increased time and max assist due to assistance needed with BLE and trunk, bed pad utilized to assist with hips    Transfers Overall transfer level: Needs assistance Equipment used: Ambulation equipment used Transfers: Sit to/from Stand, Bed to chair/wheelchair/BSC Sit to Stand: Mod assist, Total assist           General transfer comment: mod assist +2 to stand from EOB with use of bed pad to support hips Transfer via Lift Equipment: Stedy   Balance Overall balance assessment: Needs assistance Sitting-balance support: Single extremity supported, Bilateral upper extremity supported, Feet supported Sitting balance-Leahy Scale: Poor Sitting balance - Comments: min assist EOB Postural control: Posterior lean Standing balance support: Bilateral upper extremity supported Standing balance-Leahy Scale: Zero Standing balance comment: reliant on Stedy and therapists for support                           ADL either performed or assessed with clinical judgement   ADL Overall ADL's : Needs assistance/impaired     Grooming: Wash/dry hands;Wash/dry face;Oral care;Supervision/safety;Sitting;Cueing for sequencing Grooming Details (indicate cue type and reason): seated in recliner             Lower Body Dressing: Total assistance;Bed level Lower Body Dressing Details (indicate cue type and reason): donned socks                    Extremity/Trunk Assessment  Vision       Perception     Praxis     Communication Communication Communication: Impaired Factors Affecting Communication: Reduced clarity of speech;Difficulty expressing self   Cognition Arousal: Alert Behavior During Therapy: WFL for tasks assessed/performed Cognition: No apparent  impairments                               Following commands: Intact Following commands impaired: Follows one step commands with increased time      Cueing   Cueing Techniques: Verbal cues, Tactile cues, Visual cues  Exercises Exercises: General Upper Extremity General Exercises - Upper Extremity Shoulder Flexion: AAROM, Both, 5 reps, Seated Shoulder Horizontal ABduction: Strengthening, Both, 10 reps, Seated, Theraband Theraband Level (Shoulder Horizontal Abduction): Level 1 (Yellow) Elbow Flexion: Strengthening, 10 reps, Both, Theraband, Seated Theraband Level (Elbow Flexion): Level 1 (Yellow) Elbow Extension: Strengthening, Both, 10 reps, Theraband, Seated Theraband Level (Elbow Extension): Level 1 (Yellow)    Shoulder Instructions       General Comments VSS on RA    Pertinent Vitals/ Pain       Pain Assessment Pain Assessment: Faces Faces Pain Scale: Hurts even more Pain Location: buttocks with bed mobility Pain Descriptors / Indicators: Grimacing, Moaning Pain Intervention(s): Limited activity within patient's tolerance, Monitored during session, Repositioned  Home Living                                          Prior Functioning/Environment              Frequency  Min 1X/week        Progress Toward Goals  OT Goals(current goals can now be found in the care plan section)  Progress towards OT goals: Progressing toward goals  Acute Rehab OT Goals Patient Stated Goal: to be able to walk again OT Goal Formulation: With patient Time For Goal Achievement: 06/19/24 Potential to Achieve Goals: Fair ADL Goals Pt Will Perform Eating: with set-up;with supervision;with adaptive utensils;sitting Pt Will Perform Grooming: sitting;with supervision Pt Will Perform Upper Body Bathing: with min assist Pt Will Perform Upper Body Dressing: sitting;with min assist Pt Will Perform Lower Body Dressing: sit to/from stand;with contact guard  assist Pt Will Transfer to Toilet: with contact guard assist;ambulating;regular height toilet Pt Will Perform Toileting - Clothing Manipulation and hygiene: sit to/from stand;with contact guard assist Pt/caregiver will Perform Home Exercise Program: Both right and left upper extremity;With minimal assist;Increased ROM;Increased strength;With written HEP provided Additional ADL Goal #1: Pt will perform bed mobility with mod A for repositioning in order to prevent skin breakdown Additional ADL Goal #2: Pt daughter will demonstrate independence with PROM HEP for BLE ROM  Plan      Co-evaluation                 AM-PAC OT 6 Clicks Daily Activity     Outcome Measure   Help from another person eating meals?: A Little Help from another person taking care of personal grooming?: A Little Help from another person toileting, which includes using toliet, bedpan, or urinal?: Total Help from another person bathing (including washing, rinsing, drying)?: A Lot Help from another person to put on and taking off regular upper body clothing?: A Lot Help from another person to put on and taking off regular lower body clothing?: Total 6 Click Score: 12  End of Session Equipment Utilized During Treatment: Gait belt;Other (comment) Laurent)  OT Visit Diagnosis: Other abnormalities of gait and mobility (R26.89);Muscle weakness (generalized) (M62.81);History of falling (Z91.81);Feeding difficulties (R63.3);Ataxia, unspecified (R27.0) Pain - part of body:  (sacral area)   Activity Tolerance Patient tolerated treatment well   Patient Left in chair;with call bell/phone within reach;with chair alarm set   Nurse Communication Mobility status;Need for lift equipment        Time: [phone] OT Time Calculation (min): 30 min  Charges: OT General Charges $OT Visit: 1 Visit OT Treatments $Self Care/Home Management : 8-22 mins $Therapeutic Exercise: 8-22 mins  Dick Laine, OTA Acute Rehabilitation  Services  Office [phone]   Jeb LITTIE Laine 06/12/2024, 2:09 PM

## 2024-06-12 NOTE — Progress Notes (Signed)
 Mobility Specialist: Progress Note   06/12/24 1519  Mobility  Activity Transferred from chair to bed  Level of Assistance +2 (takes two people)  Assistive Device Stedy  Activity Response Tolerated well  Mobility Referral Yes  Mobility visit 1 Mobility  Mobility Specialist Start Time (ACUTE ONLY) 1010  Mobility Specialist Stop Time (ACUTE ONLY) 1033  Mobility Specialist Time Calculation (min) (ACUTE ONLY) 23 min    Pt received in chair, requesting assistance back to bed. Tolerated ~45min in the chair today. ModA+2 for STS into stedy. ModA+2 for bed mobility. Left in bed all needs met, call bell in reach.   Ileana Lute Mobility Specialist Please contact via SecureChat or Rehab office at [phone]

## 2024-06-12 NOTE — Plan of Care (Signed)
  Problem: Education: Goal: Knowledge of risk factors and measures for prevention of condition will improve Outcome: Progressing   Problem: Coping: Goal: Psychosocial and spiritual needs will be supported Outcome: Progressing   Problem: Skin Integrity: Goal: Risk for impaired skin integrity will decrease Outcome: Progressing   Problem: Skin Integrity: Goal: Risk for impaired skin integrity will decrease Outcome: Progressing

## 2024-06-12 NOTE — Progress Notes (Signed)
 Calorie Count Note  Continues to have limited information available for accurate calorie count. No meal tickets saved for Lunch, Joaquin 7/22 or Breakfast 7/23. Discussed with RN, who communicated PO intake for breakfast and lunch today.  48 hour calorie count ordered.  Diet: Dysphagia 1, thin liquids Supplements: Magic Cup, Ensure High Protein, Juven  7/22 Lunch: No information Dinner: No information 7/23 Breakfast: Meal ticket not included, documentation states 50% of meal. RN states The patient ate all of his eggs and pancakes for breakfast and drank half of his coffee. - 427 kcal, 15 g pro Lunch: Meal intake not including, nursing states 50% of broccoli and mac abd cheese, 25% of chicken, none of the pudding, magic cup or ice cream - 197 kcal, 13 g pro Supplements: Not documented  Total intake: 624 kcal (31% of minimum estimated needs)  28g protein (29% of minimum estimated needs)  Suspect this not accurate as all intake was not documented, most supplement intake unknown.   Estimated Nutritional Needs:  Kcal:  2000-2200 Protein:  95-100 grams Fluid:  >2.0 L   INTERVENTION:  Continue Magic Cup TID with meals, each supplement provides 290 kcal and 9 grams of protein Continue Ensure Plus High Protein po TID, each supplement provides 350 kcal and 20 grams of protein Continue vanilla Greek yogurt on evening tray Continue Juven BID to support wound healing Continue Multivitamin w/ minerals daily Recommend checking Vitamin A , Vitamin C , and Zinc  levels as pt has received supplementation for wound healing. Discontinue Vitamin A  and Vitamin C  supplementation  Continue calorie count to assess pt PO intake for adequacy Nursing to assist with feeding pt   NUTRITION DIAGNOSIS:  Severe Malnutrition related to chronic illness as evidenced by energy intake < or equal to 75% for > or equal to 1 month, severe muscle depletion.   Ongoing  GOAL:  Patient will meet greater than or  equal to 90% of their needs  Not progressing per documented intake, suspect inaccurate  Will continue with calorie count tomorrow to get more information  Fawaz Borquez Daml-Budig, RDN, LDN Registered Dietitian Nutritionist RD Inpatient Contact Info in [place]

## 2024-06-13 LAB — GLUCOSE, CAPILLARY
Glucose-Capillary: 138 mg/dL — ABNORMAL HIGH (ref 70–99)
Glucose-Capillary: 140 mg/dL — ABNORMAL HIGH (ref 70–99)
Glucose-Capillary: 163 mg/dL — ABNORMAL HIGH (ref 70–99)
Glucose-Capillary: 119 mg/dL — ABNORMAL HIGH (ref 70–99)
Glucose-Capillary: 185 mg/dL — ABNORMAL HIGH (ref 70–99)

## 2024-06-13 LAB — VITAMIN C: Vitamin C: 2.3 mg/dL — ABNORMAL HIGH (ref 0.4–2.0)

## 2024-06-13 NOTE — Progress Notes (Signed)
 Attempted to do a dressing change on right heel, but the patient refused saying that he was in a lot of pain.

## 2024-06-13 NOTE — Progress Notes (Signed)
 PROGRESS NOTE  Earl [name]  FMW:[id number] DOB: [birth year] DOA: 03/04/2024 PCP: [name], MD   Brief Narrative: Patient is a 83 year old male with history of paroxysmal A-fib, insulin -dependent diabetes, hypertension, hyperlipidemia, CVA with residual right-sided weakness, MGUS, GERD, BPH, hypothyroidism, HFrEF with EF of 40 to 45%, chronic pancytopenia who presented to the emergency department from SNF with complaint of shortness of breath, wound, pain in the sacral area. He was just discharged from here to [facility] rehab 4 days ago. Hospital course was remarkable for acute metabolic encephalopathy, hypotension, hypoglycemia.  Found to have worsening sacral ulcers.  General surgery consulted.Status post bedside excisional debridement of the sacral wound on 4/15 and 4/17.  Prolonged hospitalization due to reluctance of family to take him home because of pressure ulcers.They are anticipating the ulcers to improve before they can take him home  Assessment & Plan:  Principal Problem:   Severe sepsis (HCC) Active Problems:   Paroxysmal atrial fibrillation (HCC)   Sepsis (HCC)   Atrial flutter (HCC)   Sacral ulcer (HCC)   MGUS (monoclonal gammopathy of unknown significance)   Essential hypertension   Hyperlipidemia   Insulin  dependent type 2 diabetes mellitus (HCC)   History of CVA (cerebrovascular accident)   Cardiac amyloidosis (HCC)   Chronic systolic CHF (congestive heart failure) (HCC)   GERD (gastroesophageal reflux disease)   History of hypertension   Atrial fibrillation with RVR (HCC)   Community acquired pneumonia   Protein-calorie malnutrition, severe   Wild-type transthyretin-related (ATTR) amyloidosis (HCC)   Sacral decubitus ulcer, stage IV (HCC)  Sepsis secondary to sacral pressure wound/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  Hypotensive,  encephalopathic, hypoglycemic, elevated lactate, leukocytosis on presentation.  Started on IV fluid, broad spectrum antibiotics.  Cultures:NGTD.  Completed antibiotics course. 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  General surgery consulted,   PT consulted for hydrotherapy. Started  zinc , vitamin C .Status post bedside excisional debridement of the sacral wound on 4/15 and 4/17.  Also seen by ID.  Completed antibiotics course. CT on 6/16 concern for possible osteomyelitis. Surgery consulted and performed wound debridement during this hospitalization. Plastic surgery was also consulted with recommendation for continued wound care and no surgical management.Continue current wound care, continue pressure relieving therapies. Wound care nurse following. 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 The ulcer itself is less concave in appearance compared to prior. No new focal drainable fluid collection.  Also has left trochanteric ulceration with yellow sloughing. Debulked by ortho at the bedside on 7/14, wound care on board. Skin protected to the hip with VAC drape for foam bridge    Permanent  A-fib: Presented with A-fib with RVR.   Cardiology was following.   Digoxin  discontinued.  Amiodarone  changed to oral.  Eliquis   restarted.  Currently rate is well-controlled:in and out of A-fib   History of HFrEF/transthyretin cardiac amyloidosis/nonischemic cardiomyopathy/hypotension:Continue Tafamidis  and vutrisiran .Continue with lasix   Recurrent bilateral pleural effusion: Underwent left-sided thoracentesis on 6/18.  Body fluid  culture without any growth.  Also underwent bilateral thoracentesis on 7/14 by pulmonology .transudative in nature   Insulin -dependent diabetes type 2: Presented with hyperglycemia.  Recent A1c  in the range of 8.  Diabetic coordinator consulted.  Currently on sliding scale, Lantus    History of CVA: Has a right-sided residual weakness.  On Eliquis , Lipitor.   GERD: Continue Protonix   Anemia of chronic disease: Continue iron supplementation   Hypothyroidism: Continue levothyroxine   Constipation: Continue bowel regimen    Severe protein calorie malnutrition/suspicion for dysphagia/hypophosphatemia:   Dietitian was consulted.  Goals of care: Very deconditioned elderly male with bedbound status, multiple comorbidities, sacral pressure ulcer.  CODE STATUS DNR.  Palliative care consulted for goals of care.   PT/OT consulted.  Recommended SNF on discharge.  TOC following for placement      Nutrition Problem: Severe Malnutrition Etiology: chronic illness Pressure Injury 02/26/24 Coccyx Mid Stage 4 - Full thickness tissue loss with exposed bone, tendon or muscle. (Active)  02/26/24 0916  Location: Coccyx  Location Orientation: Mid  Staging: Stage 4 - Full thickness tissue loss with exposed bone, tendon or muscle.  Wound Description (Comments):   DO NOT USE:  Present on Admission: Yes  Dressing Type Foam - Lift dressing to assess site every shift 06/13/24 0334     Pressure Injury 02/26/24 Toe (Comment  which one) Anterior;Right Deep Tissue Pressure Injury - Purple or maroon localized area of discolored intact skin or blood-filled blister due to damage of underlying soft tissue from pressure and/or shear. right gre (Active)  02/26/24 1215  Location: Toe (Comment  which one)  Location Orientation: Anterior;Right  Staging: Deep Tissue Pressure Injury - Purple or maroon localized area of discolored intact skin or blood-filled blister due to damage of underlying soft tissue from pressure and/or  shear.  Wound Description (Comments): right great toe  DO NOT USE:  Present on Admission:   Dressing Type Foam - Lift dressing to assess site every shift 06/11/24 0800     Pressure Injury 03/06/24 Hip Anterior;Left;Proximal Unstageable - Full thickness tissue loss in which the base of the injury is covered by slough (yellow, tan, gray, green or brown) and/or eschar (tan, brown or black) in the wound bed. unstageable (Active)  03/06/24 1612  Location: Hip  Location Orientation: Anterior;Left;Proximal  Staging: Unstageable - Full thickness tissue loss in which the base of the injury is covered by slough (yellow, tan, gray, green or brown) and/or eschar (tan, brown or black) in the wound bed.  Wound Description (Comments): unstageable  DO NOT USE:  Present on Admission: No  Dressing Type Negative pressure wound therapy 06/11/24 0800     Pressure Injury 03/06/24 Shoulder Left Unstageable - Full thickness tissue loss in which the base of the injury is covered by slough (yellow, tan, gray, green or brown) and/or eschar (tan, brown or black) in the wound bed. unstageable (Active)  03/06/24 1613  Location: Shoulder  Location Orientation: Left  Staging: Unstageable - Full thickness tissue loss in which the base of the injury is covered by slough (yellow, tan, gray, green or brown) and/or eschar (tan, brown or black) in the wound bed.  Wound Description (Comments): unstageable  DO NOT USE:  Present on Admission: No  Dressing Type None 06/13/24 0336     Pressure Injury 04/23/24 Heel Right Unstageable - Full thickness tissue loss in which the base of the injury is covered by slough (yellow, tan, gray, green or brown) and/or eschar (tan, brown or black) in the wound bed. DTPI evolving into unstageable (Active)  04/23/24   Location: Heel  Location Orientation: Right  Staging: Unstageable - Full thickness tissue loss in which  the base of the injury is covered by slough (yellow, tan, gray, green or brown)  and/or eschar (tan, brown or black) in the wound bed.  Wound Description (Comments): DTPI evolving into unstageable  DO NOT USE:  Present on Admission: No  Dressing Type Foam - Lift dressing to assess site every shift 06/13/24 0559     Pressure Injury 04/23/24 Heel Left Deep Tissue Pressure Injury - Purple or maroon localized area of discolored intact skin or blood-filled blister due to damage of underlying soft tissue from pressure and/or shear. (Active)  04/23/24   Location: Heel  Location Orientation: Left  Staging: Deep Tissue Pressure Injury - Purple or maroon localized area of discolored intact skin or blood-filled blister due to damage of underlying soft tissue from pressure and/or shear.  Wound Description (Comments):   DO NOT USE:  Present on Admission: No  Dressing Type Foam - Lift dressing to assess site every shift 06/13/24 0558    DVT prophylaxis:SCDs Start: 03/04/24 2151 Place TED hose Start: 03/04/24 2151 apixaban  (ELIQUIS ) tablet 5 mg     Code Status: Limited: Do not attempt resuscitation (DNR) -DNR-LIMITED -Do Not Intubate/DNI   Family Communication: Called and discussed with daughter Para on phone on 7/23  Patient status:Inpatient  Patient is from :home  Anticipated discharge to:not sure  Estimated DC date:not sure   Consultants: Cardiology, general surgery, PCCM  Procedures: Wound excision and debridement, thoracentesis  Antimicrobials:  Anti-infectives (From admission, onward)    Start     Dose/Rate Route Frequency Ordered Stop   04/27/24 1615  cefTAZidime  (FORTAZ ) 2 g in sodium chloride  0.9 % 100 mL IVPB        2 g 200 mL/hr over 30 Minutes Intravenous Every 8 hours 04/27/24 1518 05/03/24 2359   03/16/24 1015  cefTRIAXone  (ROCEPHIN ) 2 g in sodium chloride  0.9 % 100 mL IVPB        2 g 200 mL/hr over 30 Minutes Intravenous Every 24 hours 03/16/24 0929 03/20/24 1219   03/16/24 1015  doxycycline  (VIBRA -TABS) tablet 100 mg        100 mg Oral Every 12  hours 03/16/24 0929 03/20/24 2131   03/14/24 1000  metroNIDAZOLE  (FLAGYL ) tablet 500 mg        500 mg Oral Every 12 hours 03/14/24 0803 03/19/24 2219   03/13/24 1000  cefadroxil  (DURICEF) capsule 1,000 mg  Status:  Discontinued        1,000 mg Oral 2 times daily 03/12/24 1632 03/16/24 0929   03/07/24 2200  metroNIDAZOLE  (FLAGYL ) tablet 500 mg        500 mg Oral Every 12 hours 03/07/24 0840 03/11/24 2258   03/05/24 1930  vancomycin  (VANCOREADY) IVPB 1250 mg/250 mL  Status:  Discontinued        1,250 mg 166.7 mL/hr over 90 Minutes Intravenous Every 24 hours 03/04/24 2205 03/04/24 2224   03/05/24 1930  Vancomycin  (VANCOCIN ) 1,250 mg in sodium chloride  0.9 % 250 mL IVPB  Status:  Discontinued        1,250 mg 166.7 mL/hr over 90 Minutes Intravenous Every 24 hours 03/04/24 2224 03/05/24 0717   03/05/24 1930  vancomycin  (VANCOREADY) IVPB 1500 mg/300 mL  Status:  Discontinued        1,500 mg 150 mL/hr over 120 Minutes Intravenous Every 24 hours 03/05/24 0717 03/12/24 1636   03/05/24 1400  ceFEPIme  (MAXIPIME ) 2 g in sodium chloride  0.9 % 100 mL IVPB  Status:  Discontinued  2 g 200 mL/hr over 30 Minutes Intravenous Every 8 hours 03/05/24 0717 03/12/24 1632   03/05/24 0600  ceFEPIme  (MAXIPIME ) 2 g in sodium chloride  0.9 % 100 mL IVPB  Status:  Discontinued        2 g 200 mL/hr over 30 Minutes Intravenous Every 12 hours 03/04/24 2205 03/05/24 0717   03/05/24 0000  metroNIDAZOLE  (FLAGYL ) IVPB 500 mg  Status:  Discontinued        500 mg 100 mL/hr over 60 Minutes Intravenous Every 12 hours 03/04/24 2206 03/04/24 2209   03/04/24 1730  aztreonam (AZACTAM) 2 g in sodium chloride  0.9 % 100 mL IVPB  Status:  Discontinued        2 g 200 mL/hr over 30 Minutes Intravenous  Once 03/04/24 1715 03/04/24 1724   03/04/24 1730  metroNIDAZOLE  (FLAGYL ) IVPB 500 mg  Status:  Discontinued        500 mg 100 mL/hr over 60 Minutes Intravenous Every 12 hours 03/04/24 1715 03/07/24 0840   03/04/24 1730  vancomycin   (VANCOREADY) IVPB 1500 mg/300 mL        1,500 mg 150 mL/hr over 120 Minutes Intravenous  Once 03/04/24 1715 03/04/24 2153   03/04/24 1730  ceFEPIme  (MAXIPIME ) 2 g in sodium chloride  0.9 % 100 mL IVPB        2 g 200 mL/hr over 30 Minutes Intravenous  Once 03/04/24 1724 03/04/24 1826       Subjective: Patient seen and examined at bedside today.  Comfortable.  Hemodynamically stable lying in bed.  Denying new complaints except for the pain on the pressure ulcers.  Objective: Vitals:   06/13/24 0003 06/13/24 0128 06/13/24 0527 06/13/24 0740  BP: 109/75  103/63 122/81  Pulse: 73  73 78  Resp: 18  18 18   Temp: 97.6 F (36.4 C)  97.6 F (36.4 C) (!) 97.5 F (36.4 C)  TempSrc: Oral  Oral Oral  SpO2: 100%  100% 100%  Weight:  63.5 kg    Height:        Intake/Output Summary (Last 24 hours) at 06/13/2024 1122 Last data filed at 06/13/2024 0323 Gross per 24 hour  Intake 250 ml  Output 1000 ml  Net -750 ml   Filed Weights   06/11/24 0100 06/12/24 0601 06/13/24 0128  Weight: 63 kg 63.5 kg 63.5 kg    Examination:  General exam: Overall comfortable, not in distress, chronically ill looking, deconditioned, malnourished HEENT: PERRL Respiratory system:  no wheezes or crackles  Cardiovascular system: S1 & S2 heard, RRR.  Gastrointestinal system: Abdomen is nondistended, soft and nontender. Central nervous system: Alert and oriented Extremities: No edema, no clubbing ,no cyanosis Skin: Pressure ulcers as above   Data Reviewed: I have personally reviewed following labs and imaging studies  CBC: Recent Labs  Lab 06/09/24 0336  WBC 5.0  HGB 11.8*  HCT 35.0*  MCV 85.8  PLT 193   Basic Metabolic Panel: Recent Labs  Lab 06/09/24 0336  NA 136  K 4.3  CL 105  CO2 23  GLUCOSE 177*  BUN 27*  CREATININE 0.86  CALCIUM  8.4*     Recent Results (from the past 240 hours)  Body fluid culture w Gram Stain     Status: None   Collection Time: 06/03/24  1:43 PM   Specimen:  Pleural Fluid  Result Value Ref Range Status   Specimen Description PLEURAL  Final   Special Requests LEFT  Final   Gram Stain NO WBC SEEN NO  ORGANISMS SEEN   Final   Culture   Final    NO GROWTH 3 DAYS Performed at [facility], [address], [place], [place] [address]    Report Status 06/07/2024 FINAL  Final  Body fluid culture w Gram Stain     Status: None   Collection Time: 06/03/24  1:44 PM   Specimen: Pleural Fluid  Result Value Ref Range Status   Specimen Description PLEURAL  Final   Special Requests RIGHT LUNG  Final   Gram Stain NO WBC SEEN NO ORGANISMS SEEN   Final   Culture   Final    NO GROWTH 3 DAYS Performed at [facility], [address], [place], [place] [address]    Report Status 06/07/2024 FINAL  Final     Radiology Studies: No results found.  Scheduled Meds:  amiodarone   200 mg Oral Daily   apixaban   5 mg Oral BID   atorvastatin   40 mg Oral Daily   calcium  carbonate  1 tablet Oral TID   Chlorhexidine  Gluconate Cloth  6 each Topical Daily   collagenase    Topical Daily   diclofenac  Sodium  4 g Topical QID   feeding supplement  237 mL Oral TID BM   ferrous sulfate   325 mg Oral QHS   furosemide   20 mg Oral Daily   guaiFENesin   600 mg Oral BID   insulin  aspart  0-9 Units Subcutaneous TID WC   insulin  glargine-yfgn  6 Units Subcutaneous Daily   levothyroxine   50 mcg Oral Q0600   lidocaine   1 patch Transdermal Q24H   liver oil-zinc  oxide   Topical BID   multivitamin with minerals  1 tablet Oral Daily   nutrition supplement (JUVEN)  1 packet Oral BID BM   mouth rinse  15 mL Mouth Rinse 4 times per day   polyethylene glycol  17 g Oral BID   potassium chloride   20 mEq Oral Daily   senna-docusate  1 tablet Oral QHS   sertraline   50 mg Oral Daily   sodium chloride  flush  10-40 mL Intracatheter Q12H   Tafamidis   1 capsule Oral Daily   vutrisiran  sodium  25 mg Subcutaneous Q90 days   Continuous Infusions:  dextrose        LOS: 101 days    Ivonne Mustache, MD [facility] P7/24/2025, 11:22 AM

## 2024-06-13 NOTE — Progress Notes (Signed)
 SLP Cancellation Note  Patient Details Name: Earl [name] MRN: [id number] DOB: [birth year]   Cancelled treatment:       Reason Eval/Treat Not Completed: Pain limiting ability to participate (RN updated).    [name] 06/13/2024, 10:49 AM

## 2024-06-13 NOTE — Plan of Care (Signed)
  Problem: Fluid Volume: Goal: Hemodynamic stability will improve Outcome: Progressing   Problem: Clinical Measurements: Goal: Diagnostic test results will improve Outcome: Progressing Goal: Signs and symptoms of infection will decrease Outcome: Progressing   Problem: Education: Goal: Knowledge of risk factors and measures for prevention of condition will improve Outcome: Progressing   Problem: Coping: Goal: Psychosocial and spiritual needs will be supported Outcome: Progressing   Problem: Respiratory: Goal: Will maintain a patent airway Outcome: Progressing Goal: Complications related to the disease process, condition or treatment will be avoided or minimized Outcome: Progressing   Problem: Education: Goal: Ability to describe self-care measures that may prevent or decrease complications (Diabetes Survival Skills Education) will improve Outcome: Progressing   Problem: Coping: Goal: Ability to adjust to condition or change in health will improve Outcome: Progressing   Problem: Fluid Volume: Goal: Ability to maintain a balanced intake and output will improve Outcome: Progressing   Problem: Health Behavior/Discharge Planning: Goal: Ability to manage health-related needs will improve Outcome: Progressing   Problem: Metabolic: Goal: Ability to maintain appropriate glucose levels will improve Outcome: Progressing   Problem: Nutritional: Goal: Maintenance of adequate nutrition will improve Outcome: Progressing Goal: Progress toward achieving an optimal weight will improve Outcome: Progressing   Problem: Skin Integrity: Goal: Risk for impaired skin integrity will decrease Outcome: Progressing   Problem: Tissue Perfusion: Goal: Adequacy of tissue perfusion will improve Outcome: Progressing   Problem: Education: Goal: Knowledge of General Education information will improve Description: Including pain rating scale, medication(s)/side effects and non-pharmacologic  comfort measures Outcome: Progressing   Problem: Health Behavior/Discharge Planning: Goal: Ability to manage health-related needs will improve Outcome: Progressing   Problem: Clinical Measurements: Goal: Ability to maintain clinical measurements within normal limits will improve Outcome: Progressing Goal: Will remain free from infection Outcome: Progressing Goal: Diagnostic test results will improve Outcome: Progressing Goal: Respiratory complications will improve Outcome: Progressing Goal: Cardiovascular complication will be avoided Outcome: Progressing   Problem: Activity: Goal: Risk for activity intolerance will decrease Outcome: Progressing   Problem: Nutrition: Goal: Adequate nutrition will be maintained Outcome: Progressing   Problem: Coping: Goal: Level of anxiety will decrease Outcome: Progressing   Problem: Elimination: Goal: Will not experience complications related to bowel motility Outcome: Progressing Goal: Will not experience complications related to urinary retention Outcome: Progressing   Problem: Pain Managment: Goal: General experience of comfort will improve and/or be controlled Outcome: Progressing   Problem: Safety: Goal: Ability to remain free from injury will improve Outcome: Progressing   Problem: Skin Integrity: Goal: Risk for impaired skin integrity will decrease Outcome: Progressing

## 2024-06-13 NOTE — TOC Progression Note (Signed)
 Transition of Care [facility]) - Progression Note    Patient Details  Name: Earl [name] MRN: [id number] Date of Birth: 09-11-1941  [facility] CM/SW Contact  Eliel Dudding A [place], LCSW Phone Number: 06/13/2024, 4:28 PM  Clinical Narrative:     CSW contacted pt's daughter Para. CSW was informed by floor staff that she may had a couple of questions that transitions of care and inpatient care management could assist. She said her questions were related to nursing care and knew her point of contact was Delon, Government social research officer, and had reached out regarding and was just waiting to hear back.   CSW asked if there were other needs related to transitions of care, she declined and said she had CSW contact and would reach out if there was anything that came up or questions she had related to questions of disposition. She continues to reiterate goal for now remaining where pt is to accommodate complex care needs.    TOC will continue to follow.   Expected Discharge Plan: Skilled Nursing Facility Barriers to Discharge: Continued Medical Work up               Expected Discharge Plan and Services In-house Referral: Clinical Social Work     Living arrangements for the past 2 months:  (from [facility] short term rehab)                                       Social Drivers of Health (SDOH) Interventions SDOH Screenings   Food Insecurity: No Food Insecurity (03/05/2024)  Housing: Low Risk  (03/05/2024)  Transportation Needs: No Transportation Needs (03/05/2024)  Utilities: Not At Risk (03/05/2024)  Depression (PHQ2-9): Low Risk  (10/03/2023)  Financial Resource Strain: Patient Declined (01/16/2024)  Physical Activity: Unknown (01/16/2024)  Social Connections: Moderately Integrated (03/05/2024)  Stress: Stress Concern Present (01/16/2024)  Tobacco Use: Low Risk  (05/12/2024)    Readmission Risk Interventions    03/19/2024   10:24 AM  Readmission Risk Prevention  Plan  Transportation Screening Complete  Medication Review (RN Care Manager) Complete  PCP or Specialist appointment within 3-5 days of discharge Complete  HRI or Home Care Consult Complete  SW Recovery Care/Counseling Consult Complete  Palliative Care Screening Complete  Skilled Nursing Facility Complete

## 2024-06-13 NOTE — Progress Notes (Signed)
 Wound care and dressing change done per provider's orders.

## 2024-06-13 NOTE — Progress Notes (Signed)
 Calorie Count Note  Continues to have limited information available for accurate calorie count. RN verbally communicated 7/23 breakfast, lunch. No information on 7/23 dinner, 7/24 breakfast, lunch.   48 hour calorie count ordered.   Diet: Dysphagia 1, thin liquids Supplements: Magic Cup, Ensure High Protein, Juven   7/23 Breakfast: Meal ticket not included, documentation states 50% of meal. RN states The patient ate all of his eggs and pancakes for breakfast and drank half of his coffee. - 427 kcal, 15 g pro Lunch: Meal intake not including, nursing states 50% of broccoli and mac abd cheese, 25% of chicken, none of the pudding, magic cup or ice cream - 197 kcal, 13 g pro 7/24 No information Supplements: Not documented   Total intake: 624 kcal (31% of minimum estimated needs)  28g protein (29% of minimum estimated needs)   Suspect this not accurate as all intake was not documented, most supplement intake unknown.    Estimated Nutritional Needs:  Kcal:  2000-2200 Protein:  95-100 grams Fluid:  >2.0 L   INTERVENTION:  Continue Magic Cup TID with meals, each supplement provides 290 kcal and 9 grams of protein Change to Nepro TID for more concentrated kcal/pro, each supplement provides 420 kcal, 19 g protein Continue vanilla Greek yogurt on evening tray Continue Juven BID to support wound healing Continue Multivitamin w/ minerals daily Continue calorie count to assess pt PO intake for adequacy Nursing to assist with feeding pt   NUTRITION DIAGNOSIS:  Severe Malnutrition related to chronic illness as evidenced by energy intake < or equal to 75% for > or equal to 1 month, severe muscle depletion.    Ongoing   GOAL:  Patient will meet greater than or equal to 90% of their needs   Not progressing per documented intake, suspect inaccurate   Will continue with calorie count tomorrow to get more information  Ziara Thelander Daml-Budig, RDN, LDN Registered Dietitian Nutritionist RD  Inpatient Contact Info in [place]

## 2024-06-14 LAB — GLUCOSE, CAPILLARY
Glucose-Capillary: 116 mg/dL — ABNORMAL HIGH (ref 70–99)
Glucose-Capillary: 190 mg/dL — ABNORMAL HIGH (ref 70–99)
Glucose-Capillary: 233 mg/dL — ABNORMAL HIGH (ref 70–99)
Glucose-Capillary: 233 mg/dL — ABNORMAL HIGH (ref 70–99)
Glucose-Capillary: 214 mg/dL — ABNORMAL HIGH (ref 70–99)

## 2024-06-14 MED ORDER — INSULIN ASPART 100 UNIT/ML IJ SOLN
2.0000 [IU] | Freq: Once | INTRAMUSCULAR | Status: AC
  Administered 2024-06-14: 2 [IU] via SUBCUTANEOUS

## 2024-06-14 NOTE — Progress Notes (Signed)
 Physical Therapy Treatment Patient Details Name: Earl [name] MRN: [id number] DOB: Sep 20, 1941 Today's Date: 06/14/2024   History of Present Illness 83 y.o. male admitted from [facility]F rehab on 03/04/24 with SOB, sacral pain, Afib with RVR. Pt with acute metabolic encephalopathy, rhabdomyolysis and sepsis secondary to sacral wound infection. 4/15 & 4/17 bedside sacral wound debridement. Pt also with B pleural effusion, 6/18 L thoracentesis, 7/14 L & R thoracentesis.  PMhx: adm 3/26-4/08/2024 with fall, AMS, COVID; d/c to SNF rehab. CVA (2021, residual R-side weakness), T2DM, HTN, CHF, MGUS, Afib on Eliquis .    PT Comments  Pt reports he is still trying to wake up but is agreeable to getting to chair. Pt requires modAx2 for coming to [facility]. Using the [facility] pt is able to stand with min-modAx2. Pt continues to be limited in his mobility by fear of forward posture requiring him to use increased UE strength for standing in [place]. Pt goals reviewed and updated. PT will continue to follow acutely.    If plan is discharge home, recommend the following: Two people to help with walking and/or transfers;Two people to help with bathing/dressing/bathroom;Assistance with feeding;Assist for transportation   Can travel by private vehicle     No  Equipment Recommendations  Hospital bed;Hoyer lift;Wheelchair cushion (measurements PT);Wheelchair (measurements PT) (air mattress, roho cushion)       Precautions / Restrictions Precautions Precautions: Fall Recall of Precautions/Restrictions: Impaired Precaution/Restrictions Comments: sacral wound; L hip wound with wound vac, bilat heel wounds; L shoulder wound Restrictions Weight Bearing Restrictions Per Provider Order: No     Mobility  Bed Mobility Overal bed mobility: Needs Assistance Bed Mobility: Rolling, Supine to Sit, Sit to Supine Rolling: Mod assist, +2 for physical assistance         General bed mobility comments: with increased time  and effort pt is able to bring himself to [facility] with use of rail, maximal cuing and modAx2    Transfers Overall transfer level: Needs assistance Equipment used: Ambulation equipment used Transfers: Sit to/from Stand Sit to Stand: Mod assist, Min assist, +2 physical assistance, From elevated surface, Via lift equipment           General transfer comment: pt able to stand from bed with mod Ax2 and maximal cuing for bringing belly towards the bar to improve upright posture and reducing less UE strength, pt minAx2 for power up from Stedy pads, and modAx2 for final sit<>stand from low chair surface          Balance Overall balance assessment: Needs assistance Sitting-balance support: Single extremity supported, Bilateral upper extremity supported, Feet supported Sitting balance-Leahy Scale: Poor Sitting balance - Comments: CGA for safety Postural control: Posterior lean Standing balance support: Bilateral upper extremity supported Standing balance-Leahy Scale: Zero Standing balance comment: maxA                            Communication Communication Communication: Impaired Factors Affecting Communication: Reduced clarity of speech;Difficulty expressing self  Cognition Arousal: Alert Behavior During Therapy: WFL for tasks assessed/performed   PT - Cognitive impairments: Problem solving Difficult to assess due to: Impaired communication                       Following commands: Intact      Cueing Cueing Techniques: Verbal cues, Tactile cues, Visual cues     General Comments General comments (skin integrity, edema, etc.): VSS on RA  Pertinent Vitals/Pain Pain Assessment Pain Assessment: Faces Faces Pain Scale: Hurts even more Pain Location: wounds with positional change Pain Descriptors / Indicators: Moaning     PT Goals (current goals can now be found in the care plan section) Acute Rehab PT Goals Patient Stated Goal: move better PT Goal  Formulation: With patient/family Time For Goal Achievement: 06/28/24 Potential to Achieve Goals: Fair Progress towards PT goals: Progressing toward goals    Frequency    Min 1X/week       AM-PAC PT 6 Clicks Mobility   Outcome Measure  Help needed turning from your back to your side while in a flat bed without using bedrails?: A Lot Help needed moving from lying on your back to sitting on the side of a flat bed without using bedrails?: A Lot Help needed moving to and from a bed to a chair (including a wheelchair)?: Total Help needed standing up from a chair using your arms (e.g., wheelchair or bedside chair)?: Total Help needed to walk in hospital room?: Total Help needed climbing 3-5 steps with a railing? : Total 6 Click Score: 8    End of Session Equipment Utilized During Treatment: Gait belt Activity Tolerance: Patient tolerated treatment well Patient left: with call bell/phone within reach;in chair;with chair alarm set Nurse Communication: Mobility status;Need for lift equipment PT Visit Diagnosis: Muscle weakness (generalized) (M62.81);Adult, failure to thrive (R62.7);Other abnormalities of gait and mobility (R26.89);Unsteadiness on feet (R26.81);Difficulty in walking, not elsewhere classified (R26.2) Pain - Right/Left: Left Pain - part of body: Knee     Time: 1139-1200 PT Time Calculation (min) (ACUTE ONLY): 21 min  Charges:    $Therapeutic Activity: 8-22 mins PT General Charges $$ ACUTE PT VISIT: 1 Visit                     Naitik Hermann B. [name] PT, DPT Acute Rehabilitation Services Please use secure chat or  Call Office [phone]     [name] 06/14/2024, 1:08 PM

## 2024-06-14 NOTE — Progress Notes (Signed)
 Calorie Count Note  Documentation improved for 7/24-7/25 calorie count.   48 hour calorie count ordered, increased to 72 hr.    Diet: Dysphagia 1, thin liquids Supplements: [employer], Nepro, Juven   7/24 Lunch: 25% of Beef/gravy (38 kcal, 5.4 g pro) though meal doc at 75% Dinner: 100% of Chicken/gravy, 100% Peas (332 kcal, 24 pro) 7/25 Breakfast: 50% of 2 eggs, 100% Waffle/Syrup, 50% Peaches, 100% Cran juice, 80% Coffee, 50% Vanilla Magic Cup (681 kcal, 21 g pro)      Total intake: 1051 kcal (53% of minimum estimated needs)  50 g protein (53% of minimum estimated needs)   Suspect this not accurate as all intake was not documented, most supplement intake unknown.    Estimated Nutritional Needs:  Kcal:  2000-2200 Protein:  95-100 grams Fluid:  >2.0 L   INTERVENTION:  Continue Magic Cup TID with meals, each supplement provides 290 kcal and 9 grams of protein Continue Nepro TID for more concentrated kcal/pro, each supplement provides 420 kcal, 19 g protein Continue Vanilla Greek yogurt on evening tray Continue Juven BID to support wound healing Continue Multivitamin w/ minerals daily Nursing to assist with feeding pt   NUTRITION DIAGNOSIS:  Severe Malnutrition related to chronic illness as evidenced by energy intake < or equal to 75% for > or equal to 1 month, severe muscle depletion.    Ongoing   GOAL:  Patient will meet greater than or equal to 90% of their needs   Progressing   Pt appears to be consuming fair amount of kcal/pro per the days with more documentation. Do no recommend Cortrak at this time, continue with current POC.  Ole Lafon Daml-Budig, RDN, LDN Registered Dietitian Nutritionist RD Inpatient Contact Info in [place]

## 2024-06-14 NOTE — Plan of Care (Signed)
  Problem: Coping: Goal: Psychosocial and spiritual needs will be supported Outcome: Progressing   Problem: Respiratory: Goal: Will maintain a patent airway Outcome: Progressing   Problem: Skin Integrity: Goal: Risk for impaired skin integrity will decrease Outcome: Not Progressing

## 2024-06-14 NOTE — Progress Notes (Signed)
 PROGRESS NOTE  Earl [name]  FMW:[id number] DOB: 20-Jun-1941 DOA: 03/04/2024 PCP: [name], MD   Brief Narrative: Patient is a 83 year old male with history of paroxysmal A-fib, insulin -dependent diabetes, hypertension, hyperlipidemia, CVA with residual right-sided weakness, MGUS, GERD, BPH, hypothyroidism, HFrEF with EF of 40 to 45%, chronic pancytopenia who presented to the emergency department from SNF with complaint of shortness of breath, wound, pain in the sacral area.  Hospital course was remarkable for acute metabolic encephalopathy, hypotension, hypoglycemia.  Found to have worsening sacral ulcers.  General surgery consulted.Status post bedside excisional debridement of the sacral wound on 4/15 and 4/17.  Prolonged hospitalization due to reluctance of family to take him home because of pressure ulcers.They are anticipating the ulcers to improve before they can take him home  Assessment & Plan:  Principal Problem:   Severe sepsis (HCC) Active Problems:   Paroxysmal atrial fibrillation (HCC)   Sepsis (HCC)   Atrial flutter (HCC)   Sacral ulcer (HCC)   MGUS (monoclonal gammopathy of unknown significance)   Essential hypertension   Hyperlipidemia   Insulin  dependent type 2 diabetes mellitus (HCC)   History of CVA (cerebrovascular accident)   Cardiac amyloidosis (HCC)   Chronic systolic CHF (congestive heart failure) (HCC)   GERD (gastroesophageal reflux disease)   History of hypertension   Atrial fibrillation with RVR (HCC)   Community acquired pneumonia   Protein-calorie malnutrition, severe   Wild-type transthyretin-related (ATTR) amyloidosis (HCC)   Sacral decubitus ulcer, stage IV (HCC)  Sepsis secondary to sacral pressure wound/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  Hypotensive, encephalopathic, hypoglycemic, elevated lactate, leukocytosis on  presentation.  Started on IV fluid, broad spectrum antibiotics.  Cultures:NGTD.  Completed antibiotics course. 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  General surgery consulted,   PT consulted for hydrotherapy. Started  zinc , vitamin C .Status post bedside excisional debridement of the sacral wound on 4/15 and 4/17.  Also seen by ID.  Completed antibiotics course. CT on 6/16 concern for possible osteomyelitis. Surgery consulted and performed wound debridement during this hospitalization. Plastic surgery was also consulted with recommendation for continued wound care and no surgical management.Continue current wound care, continue pressure relieving therapies. Wound care nurse following. 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 The ulcer itself is less concave in appearance compared to prior. No new focal drainable fluid collection.  Also has left trochanteric ulceration with yellow sloughing. Debulked by ortho at the bedside on 7/14, wound care on board. Skin protected to the hip with VAC drape for foam bridge    Permanent  A-fib: Presented with A-fib with RVR.   Cardiology was following.   Digoxin  discontinued.  Amiodarone  changed to oral.  Eliquis   restarted.  Currently rate is well-controlled:in and out of A-fib   History of HFrEF/transthyretin cardiac amyloidosis/nonischemic cardiomyopathy/hypotension:Continue Tafamidis  and vutrisiran .Continue with lasix   Recurrent bilateral pleural effusion: Underwent left-sided thoracentesis on 6/18.  Body fluid culture without any growth.  Also underwent bilateral thoracentesis on  7/14 by pulmonology .transudative in nature   Insulin -dependent diabetes type 2: Presented with hyperglycemia.  Recent A1c in the range of 8.  Diabetic coordinator consulted.  Currently  on sliding scale, Lantus    History of CVA: Has a right-sided residual weakness.  On Eliquis , Lipitor.   GERD: Continue Protonix   Anemia of chronic disease: Continue iron supplementation   Hypothyroidism: Continue levothyroxine   Constipation: Continue bowel regimen    Severe protein calorie malnutrition/suspicion for dysphagia/hypophosphatemia:   Dietitian was consulted.  Goals of care: Very deconditioned elderly male with bedbound status, multiple comorbidities, sacral pressure ulcer.  CODE STATUS DNR.  Palliative care consulted for goals of care.   PT/OT consulted.  Recommended SNF on discharge.  TOC following for placement      Nutrition Problem: Severe Malnutrition Etiology: chronic illness Pressure Injury 02/26/24 Coccyx Mid Stage 4 - Full thickness tissue loss with exposed bone, tendon or muscle. (Active)  02/26/24 0916  Location: Coccyx  Location Orientation: Mid  Staging: Stage 4 - Full thickness tissue loss with exposed bone, tendon or muscle.  Wound Description (Comments):   DO NOT USE:  Present on Admission: Yes  Dressing Type Foam - Lift dressing to assess site every shift 06/13/24 2000     Pressure Injury 02/26/24 Toe (Comment  which one) Anterior;Right Deep Tissue Pressure Injury - Purple or maroon localized area of discolored intact skin or blood-filled blister due to damage of underlying soft tissue from pressure and/or shear. right gre (Active)  02/26/24 1215  Location: Toe (Comment  which one)  Location Orientation: Anterior;Right  Staging: Deep Tissue Pressure Injury - Purple or maroon localized area of discolored intact skin or blood-filled blister due to damage of underlying soft tissue from pressure and/or shear.  Wound Description (Comments): right great toe  DO NOT USE:   Present on Admission:   Dressing Type Foam - Lift dressing to assess site every shift 06/13/24 2000     Pressure Injury 03/06/24 Hip Anterior;Left;Proximal Unstageable - Full thickness tissue loss in which the base of the injury is covered by slough (yellow, tan, gray, green or brown) and/or eschar (tan, brown or black) in the wound bed. unstageable (Active)  03/06/24 1612  Location: Hip  Location Orientation: Anterior;Left;Proximal  Staging: Unstageable - Full thickness tissue loss in which the base of the injury is covered by slough (yellow, tan, gray, green or brown) and/or eschar (tan, brown or black) in the wound bed.  Wound Description (Comments): unstageable  DO NOT USE:  Present on Admission: No  Dressing Type Foam - Lift dressing to assess site every shift 06/13/24 2000     Pressure Injury 03/06/24 Shoulder Left Unstageable - Full thickness tissue loss in which the base of the injury is covered by slough (yellow, tan, gray, green or brown) and/or eschar (tan, brown or black) in the wound bed. unstageable (Active)  03/06/24 1613  Location: Shoulder  Location Orientation: Left  Staging: Unstageable - Full thickness tissue loss in which the base of the injury is covered by slough (yellow, tan, gray, green or brown) and/or eschar (tan, brown or black) in the wound bed.  Wound Description (Comments): unstageable  DO NOT USE:  Present on Admission: No  Dressing Type Foam - Lift dressing to assess site every shift 06/13/24 1605     Pressure Injury 04/23/24 Heel Right Unstageable - Full thickness tissue loss in which the base of the injury is covered by slough (yellow, tan, gray, green or brown) and/or eschar (tan, brown or black) in the wound bed. DTPI evolving into unstageable (Active)  04/23/24   Location: Heel  Location Orientation: Right  Staging: Unstageable - Full thickness tissue loss  in which the base of the injury is covered by slough (yellow, tan, gray, green or brown) and/or  eschar (tan, brown or black) in the wound bed.  Wound Description (Comments): DTPI evolving into unstageable  DO NOT USE:  Present on Admission: No  Dressing Type Foam - Lift dressing to assess site every shift 06/13/24 2000     Pressure Injury 04/23/24 Heel Left Deep Tissue Pressure Injury - Purple or maroon localized area of discolored intact skin or blood-filled blister due to damage of underlying soft tissue from pressure and/or shear. (Active)  04/23/24   Location: Heel  Location Orientation: Left  Staging: Deep Tissue Pressure Injury - Purple or maroon localized area of discolored intact skin or blood-filled blister due to damage of underlying soft tissue from pressure and/or shear.  Wound Description (Comments):   DO NOT USE:  Present on Admission: No  Dressing Type Foam - Lift dressing to assess site every shift 06/13/24 2000    DVT prophylaxis:SCDs Start: 03/04/24 2151 Place TED hose Start: 03/04/24 2151 apixaban  (ELIQUIS ) tablet 5 mg     Code Status: Limited: Do not attempt resuscitation (DNR) -DNR-LIMITED -Do Not Intubate/DNI   Family Communication: Called and discussed with daughter Para on phone on 7/23  Patient status:Inpatient  Patient is from :home  Anticipated discharge to:not sure  Estimated DC date:not sure   Consultants: Cardiology, general surgery, PCCM  Procedures: Wound excision and debridement, thoracentesis  Antimicrobials:  Anti-infectives (From admission, onward)    Start     Dose/Rate Route Frequency Ordered Stop   04/27/24 1615  cefTAZidime  (FORTAZ ) 2 g in sodium chloride  0.9 % 100 mL IVPB        2 g 200 mL/hr over 30 Minutes Intravenous Every 8 hours 04/27/24 1518 05/03/24 2359   03/16/24 1015  cefTRIAXone  (ROCEPHIN ) 2 g in sodium chloride  0.9 % 100 mL IVPB        2 g 200 mL/hr over 30 Minutes Intravenous Every 24 hours 03/16/24 0929 03/20/24 1219   03/16/24 1015  doxycycline  (VIBRA -TABS) tablet 100 mg        100 mg Oral Every 12 hours  03/16/24 0929 03/20/24 2131   03/14/24 1000  metroNIDAZOLE  (FLAGYL ) tablet 500 mg        500 mg Oral Every 12 hours 03/14/24 0803 03/19/24 2219   03/13/24 1000  cefadroxil  (DURICEF) capsule 1,000 mg  Status:  Discontinued        1,000 mg Oral 2 times daily 03/12/24 1632 03/16/24 0929   03/07/24 2200  metroNIDAZOLE  (FLAGYL ) tablet 500 mg        500 mg Oral Every 12 hours 03/07/24 0840 03/11/24 2258   03/05/24 1930  vancomycin  (VANCOREADY) IVPB 1250 mg/250 mL  Status:  Discontinued        1,250 mg 166.7 mL/hr over 90 Minutes Intravenous Every 24 hours 03/04/24 2205 03/04/24 2224   03/05/24 1930  Vancomycin  (VANCOCIN ) 1,250 mg in sodium chloride  0.9 % 250 mL IVPB  Status:  Discontinued        1,250 mg 166.7 mL/hr over 90 Minutes Intravenous Every 24 hours 03/04/24 2224 03/05/24 0717   03/05/24 1930  vancomycin  (VANCOREADY) IVPB 1500 mg/300 mL  Status:  Discontinued        1,500 mg 150 mL/hr over 120 Minutes Intravenous Every 24 hours 03/05/24 0717 03/12/24 1636   03/05/24 1400  ceFEPIme  (MAXIPIME ) 2 g in sodium chloride  0.9 % 100 mL IVPB  Status:  Discontinued  2 g 200 mL/hr over 30 Minutes Intravenous Every 8 hours 03/05/24 0717 03/12/24 1632   03/05/24 0600  ceFEPIme  (MAXIPIME ) 2 g in sodium chloride  0.9 % 100 mL IVPB  Status:  Discontinued        2 g 200 mL/hr over 30 Minutes Intravenous Every 12 hours 03/04/24 2205 03/05/24 0717   03/05/24 0000  metroNIDAZOLE  (FLAGYL ) IVPB 500 mg  Status:  Discontinued        500 mg 100 mL/hr over 60 Minutes Intravenous Every 12 hours 03/04/24 2206 03/04/24 2209   03/04/24 1730  aztreonam (AZACTAM) 2 g in sodium chloride  0.9 % 100 mL IVPB  Status:  Discontinued        2 g 200 mL/hr over 30 Minutes Intravenous  Once 03/04/24 1715 03/04/24 1724   03/04/24 1730  metroNIDAZOLE  (FLAGYL ) IVPB 500 mg  Status:  Discontinued        500 mg 100 mL/hr over 60 Minutes Intravenous Every 12 hours 03/04/24 1715 03/07/24 0840   03/04/24 1730  vancomycin   (VANCOREADY) IVPB 1500 mg/300 mL        1,500 mg 150 mL/hr over 120 Minutes Intravenous  Once 03/04/24 1715 03/04/24 2153   03/04/24 1730  ceFEPIme  (MAXIPIME ) 2 g in sodium chloride  0.9 % 100 mL IVPB        2 g 200 mL/hr over 30 Minutes Intravenous  Once 03/04/24 1724 03/04/24 1826       Subjective: Patient seen and examined at bedside today.  Currently lying on bed.  Overall appears comfortable but is chronically deconditioned and weak.  Not in any kind of distress.  Denies any new complaints today.  Objective: Vitals:   06/13/24 1957 06/14/24 0049 06/14/24 0444 06/14/24 0825  BP: 121/74 117/86 112/74 113/72  Pulse: 74 73 77 73  Resp: 18 19 18    Temp: 97.6 F (36.4 C) 97.8 F (36.6 C) (!) 97.5 F (36.4 C) (!) 97.4 F (36.3 C)  TempSrc: Oral Oral Oral Oral  SpO2: 100% 100% 99% 100%  Weight:      Height:        Intake/Output Summary (Last 24 hours) at 06/14/2024 1107 Last data filed at 06/14/2024 0700 Gross per 24 hour  Intake 324 ml  Output 1650 ml  Net -1326 ml   Filed Weights   06/11/24 0100 06/12/24 0601 06/13/24 0128  Weight: 63 kg 63.5 kg 63.5 kg    Examination:   General exam: Overall comfortable, not in distress, chronically ill looking, deconditioned, malnourished HEENT: PERRL Respiratory system:  no wheezes or crackles  Cardiovascular system: S1 & S2 heard, RRR.  Gastrointestinal system: Abdomen is nondistended, soft and nontender. Central nervous system: Alert and oriented, residual right-sided weakness Extremities: No edema, no clubbing ,no cyanosis Skin: Pressure ulcers as above   Data Reviewed: I have personally reviewed following labs and imaging studies  CBC: Recent Labs  Lab 06/09/24 0336  WBC 5.0  HGB 11.8*  HCT 35.0*  MCV 85.8  PLT 193   Basic Metabolic Panel: Recent Labs  Lab 06/09/24 0336  NA 136  K 4.3  CL 105  CO2 23  GLUCOSE 177*  BUN 27*  CREATININE 0.86  CALCIUM  8.4*     No results found for this or any previous  visit (from the past 240 hours).    Radiology Studies: No results found.  Scheduled Meds:  amiodarone   200 mg Oral Daily   apixaban   5 mg Oral BID   atorvastatin   40 mg  Oral Daily   calcium  carbonate  1 tablet Oral TID   Chlorhexidine  Gluconate Cloth  6 each Topical Daily   collagenase    Topical Daily   diclofenac  Sodium  4 g Topical QID   feeding supplement  237 mL Oral TID BM   ferrous sulfate   325 mg Oral QHS   furosemide   20 mg Oral Daily   guaiFENesin   600 mg Oral BID   insulin  aspart  0-9 Units Subcutaneous TID WC   insulin  glargine-yfgn  6 Units Subcutaneous Daily   levothyroxine   50 mcg Oral Q0600   lidocaine   1 patch Transdermal Q24H   liver oil-zinc  oxide   Topical BID   multivitamin with minerals  1 tablet Oral Daily   nutrition supplement (JUVEN)  1 packet Oral BID BM   mouth rinse  15 mL Mouth Rinse 4 times per day   polyethylene glycol  17 g Oral BID   potassium chloride   20 mEq Oral Daily   senna-docusate  1 tablet Oral QHS   sertraline   50 mg Oral Daily   sodium chloride  flush  10-40 mL Intracatheter Q12H   Tafamidis   1 capsule Oral Daily   vutrisiran  sodium  25 mg Subcutaneous Q90 days   Continuous Infusions:  dextrose        LOS: 102 days   Ivonne Mustache, MD [facility] P7/25/2025, 11:07 AM

## 2024-06-15 LAB — BASIC METABOLIC PANEL WITH GFR
Anion gap: 8 (ref 5–15)
BUN: 25 mg/dL — ABNORMAL HIGH (ref 8–23)
CO2: 20 mmol/L — ABNORMAL LOW (ref 22–32)
Calcium: 8.3 mg/dL — ABNORMAL LOW (ref 8.9–10.3)
Chloride: 108 mmol/L (ref 98–111)
Creatinine, Ser: 0.89 mg/dL (ref 0.61–1.24)
GFR, Estimated: >60 mL/min (ref >60)
Glucose, Bld: 117 mg/dL — ABNORMAL HIGH (ref 70–99)
Potassium: 4.4 mmol/L (ref 3.5–5.1)
Sodium: 136 mmol/L (ref 135–145)

## 2024-06-15 LAB — CBC
HCT: 36.4 % — ABNORMAL LOW (ref 39.0–52.0)
Hemoglobin: 12.1 g/dL — ABNORMAL LOW (ref 13.0–17.0)
MCH: 29.1 pg (ref 26.0–34.0)
MCHC: 33.2 g/dL (ref 30.0–36.0)
MCV: 87.5 fL (ref 80.0–100.0)
Platelets: 247 K/uL (ref 150–400)
RBC: 4.16 MIL/uL — ABNORMAL LOW (ref 4.22–5.81)
RDW: 18.1 % — ABNORMAL HIGH (ref 11.5–15.5)
WBC: 5.3 K/uL (ref 4.0–10.5)
nRBC: 0 % (ref 0.0–0.2)

## 2024-06-15 LAB — GLUCOSE, CAPILLARY
Glucose-Capillary: 103 mg/dL — ABNORMAL HIGH (ref 70–99)
Glucose-Capillary: 191 mg/dL — ABNORMAL HIGH (ref 70–99)
Glucose-Capillary: 258 mg/dL — ABNORMAL HIGH (ref 70–99)

## 2024-06-15 MED ORDER — INSULIN ASPART 100 UNIT/ML IJ SOLN
0.0000 [IU] | Freq: Every day | INTRAMUSCULAR | Status: DC
  Administered 2024-06-15: 3 [IU] via SUBCUTANEOUS
  Administered 2024-06-16: 2 [IU] via SUBCUTANEOUS
  Administered 2024-06-22 – 2024-06-26 (×2): 3 [IU] via SUBCUTANEOUS

## 2024-06-15 MED ORDER — INSULIN ASPART 100 UNIT/ML IJ SOLN
0.0000 [IU] | Freq: Three times a day (TID) | INTRAMUSCULAR | Status: DC
  Administered 2024-06-15: 2 [IU] via SUBCUTANEOUS
  Administered 2024-06-16: 3 [IU] via SUBCUTANEOUS
  Administered 2024-06-17: 2 [IU] via SUBCUTANEOUS
  Administered 2024-06-17: 1 [IU] via SUBCUTANEOUS
  Administered 2024-06-18: 2 [IU] via SUBCUTANEOUS
  Administered 2024-06-18: 1 [IU] via SUBCUTANEOUS
  Administered 2024-06-18 – 2024-06-19 (×2): 3 [IU] via SUBCUTANEOUS
  Administered 2024-06-19 (×2): 2 [IU] via SUBCUTANEOUS
  Administered 2024-06-20: 1 [IU] via SUBCUTANEOUS
  Administered 2024-06-20 – 2024-06-21 (×2): 2 [IU] via SUBCUTANEOUS
  Administered 2024-06-21: 1 [IU] via SUBCUTANEOUS
  Administered 2024-06-22: 3 [IU] via SUBCUTANEOUS
  Administered 2024-06-22: 2 [IU] via SUBCUTANEOUS
  Administered 2024-06-22 – 2024-06-23 (×2): 3 [IU] via SUBCUTANEOUS
  Administered 2024-06-23 – 2024-06-24 (×2): 2 [IU] via SUBCUTANEOUS
  Administered 2024-06-24: 3 [IU] via SUBCUTANEOUS

## 2024-06-15 MED ORDER — ACETAMINOPHEN 325 MG PO TABS
650.0000 mg | ORAL_TABLET | Freq: Three times a day (TID) | ORAL | Status: DC
  Administered 2024-06-15 – 2024-07-02 (×53): 650 mg via ORAL
  Filled 2024-06-15 (×51): qty 2

## 2024-06-15 NOTE — Plan of Care (Signed)
  Problem: Fluid Volume: Goal: Hemodynamic stability will improve Outcome: Progressing   Problem: Clinical Measurements: Goal: Diagnostic test results will improve Outcome: Progressing Goal: Signs and symptoms of infection will decrease Outcome: Progressing   Problem: Education: Goal: Knowledge of risk factors and measures for prevention of condition will improve Outcome: Progressing   Problem: Coping: Goal: Psychosocial and spiritual needs will be supported Outcome: Progressing   Problem: Respiratory: Goal: Will maintain a patent airway Outcome: Progressing Goal: Complications related to the disease process, condition or treatment will be avoided or minimized Outcome: Progressing   Problem: Education: Goal: Ability to describe self-care measures that may prevent or decrease complications (Diabetes Survival Skills Education) will improve Outcome: Progressing   Problem: Coping: Goal: Ability to adjust to condition or change in health will improve Outcome: Progressing   Problem: Fluid Volume: Goal: Ability to maintain a balanced intake and output will improve Outcome: Progressing   Problem: Health Behavior/Discharge Planning: Goal: Ability to manage health-related needs will improve Outcome: Progressing   Problem: Metabolic: Goal: Ability to maintain appropriate glucose levels will improve Outcome: Progressing   Problem: Nutritional: Goal: Maintenance of adequate nutrition will improve Outcome: Progressing Goal: Progress toward achieving an optimal weight will improve Outcome: Progressing   Problem: Skin Integrity: Goal: Risk for impaired skin integrity will decrease Outcome: Progressing   Problem: Tissue Perfusion: Goal: Adequacy of tissue perfusion will improve Outcome: Progressing   Problem: Education: Goal: Knowledge of General Education information will improve Description: Including pain rating scale, medication(s)/side effects and non-pharmacologic  comfort measures Outcome: Progressing   Problem: Health Behavior/Discharge Planning: Goal: Ability to manage health-related needs will improve Outcome: Progressing   Problem: Clinical Measurements: Goal: Ability to maintain clinical measurements within normal limits will improve Outcome: Progressing Goal: Will remain free from infection Outcome: Progressing Goal: Diagnostic test results will improve Outcome: Progressing Goal: Respiratory complications will improve Outcome: Progressing Goal: Cardiovascular complication will be avoided Outcome: Progressing   Problem: Activity: Goal: Risk for activity intolerance will decrease Outcome: Progressing   Problem: Nutrition: Goal: Adequate nutrition will be maintained Outcome: Progressing   Problem: Coping: Goal: Level of anxiety will decrease Outcome: Progressing   Problem: Elimination: Goal: Will not experience complications related to bowel motility Outcome: Progressing Goal: Will not experience complications related to urinary retention Outcome: Progressing   Problem: Pain Managment: Goal: General experience of comfort will improve and/or be controlled Outcome: Progressing   Problem: Safety: Goal: Ability to remain free from injury will improve Outcome: Progressing   Problem: Skin Integrity: Goal: Risk for impaired skin integrity will decrease Outcome: Progressing

## 2024-06-15 NOTE — Progress Notes (Signed)
 PROGRESS NOTE  Earl [name]  FMW:[id number] DOB: [birth year] DOA: 03/04/2024 PCP: [name], MD   Brief Narrative: Patient is a 83 year old male with history of paroxysmal A-fib, insulin -dependent diabetes, hypertension, hyperlipidemia, CVA with residual right-sided weakness, MGUS, GERD, BPH, hypothyroidism, HFrEF with EF of 40 to 45%, chronic pancytopenia who presented to the emergency department from SNF with complaint of shortness of breath, wound, pain in the sacral area.  Hospital course was remarkable for acute metabolic encephalopathy, hypotension, hypoglycemia.  Found to have worsening sacral ulcers.  General surgery consulted.Status post bedside excisional debridement of the sacral wound on 4/15 and 4/17.  Prolonged hospitalization due to reluctance of family to take him home because of pressure ulcers.They are anticipating the ulcers to improve before they can take him home  Assessment & Plan:  Principal Problem:   Severe sepsis (HCC) Active Problems:   Paroxysmal atrial fibrillation (HCC)   Sepsis (HCC)   Atrial flutter (HCC)   Sacral ulcer (HCC)   MGUS (monoclonal gammopathy of unknown significance)   Essential hypertension   Hyperlipidemia   Insulin  dependent type 2 diabetes mellitus (HCC)   History of CVA (cerebrovascular accident)   Cardiac amyloidosis (HCC)   Chronic systolic CHF (congestive heart failure) (HCC)   GERD (gastroesophageal reflux disease)   History of hypertension   Atrial fibrillation with RVR (HCC)   Community acquired pneumonia   Protein-calorie malnutrition, severe   Wild-type transthyretin-related (ATTR) amyloidosis (HCC)   Sacral decubitus ulcer, stage IV (HCC)  Sepsis secondary to sacral pressure wound/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  Hypotensive, encephalopathic, hypoglycemic, elevated lactate, leukocytosis on  presentation.  Started on IV fluid, broad spectrum antibiotics.  Cultures:NGTD.  Completed antibiotics course. 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  General surgery consulted,   PT consulted for hydrotherapy. Started  zinc , vitamin C .Status post bedside excisional debridement of the sacral wound on 4/15 and 4/17.  Also seen by ID.  Completed antibiotics course. CT on 6/16 concern for possible osteomyelitis. Surgery consulted and performed wound debridement during this hospitalization. Plastic surgery was also consulted with recommendation for continued wound care and no surgical management.Continue current wound care, continue pressure relieving therapies. Wound care nurse following. 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 The ulcer itself is less concave in appearance compared to prior. No new focal drainable fluid collection.  Also has left trochanteric ulceration with yellow sloughing. Debulked by ortho at the bedside on 7/14, wound care on board. Skin protected to the hip with VAC drape for foam bridge    Permanent  A-fib: Presented with A-fib with RVR.   Cardiology was following.   Digoxin  discontinued.  Amiodarone  changed to oral.  Eliquis   restarted.  Currently rate is well-controlled:in and out of A-fib   History of HFrEF/transthyretin cardiac amyloidosis/nonischemic cardiomyopathy/hypotension:Continue Tafamidis  and vutrisiran .Continue with lasix   Recurrent bilateral pleural effusion: Underwent left-sided thoracentesis on 6/18.  Body fluid culture without any growth.  Also underwent bilateral thoracentesis on  7/14 by pulmonology .transudative in nature   Insulin -dependent diabetes type 2: Presented with hyperglycemia.  Recent A1c in the range of 8.  Diabetic coordinator consulted.  Currently  on sliding scale, Lantus    History of CVA: Has a right-sided residual weakness.  On Eliquis , Lipitor.   GERD: Continue Protonix   Anemia of chronic disease: Continue iron supplementation   Hypothyroidism: Continue levothyroxine   Constipation: Continue bowel regimen    Severe protein calorie malnutrition/suspicion for dysphagia/hypophosphatemia:   Dietitian was consulted.  Goals of care: Very deconditioned elderly male with bedbound status, multiple comorbidities, sacral pressure ulcer.  CODE STATUS DNR.  Palliative care consulted for goals of care.   PT/OT consulted.  Recommended SNF on discharge.  TOC following for placement      Nutrition Problem: Severe Malnutrition Etiology: chronic illness Pressure Injury 02/26/24 Coccyx Mid Stage 4 - Full thickness tissue loss with exposed bone, tendon or muscle. (Active)  02/26/24 0916  Location: Coccyx  Location Orientation: Mid  Staging: Stage 4 - Full thickness tissue loss with exposed bone, tendon or muscle.  Wound Description (Comments):   DO NOT USE:  Present on Admission: Yes  Dressing Type Silver hydrofiber;Foam - Lift dressing to assess site every shift 06/15/24 0300     Pressure Injury 02/26/24 Toe (Comment  which one) Anterior;Right Deep Tissue Pressure Injury - Purple or maroon localized area of discolored intact skin or blood-filled blister due to damage of underlying soft tissue from pressure and/or shear. right gre (Active)  02/26/24 1215  Location: Toe (Comment  which one)  Location Orientation: Anterior;Right  Staging: Deep Tissue Pressure Injury - Purple or maroon localized area of discolored intact skin or blood-filled blister due to damage of underlying soft tissue from pressure and/or shear.  Wound Description (Comments): right great  toe  DO NOT USE:  Present on Admission:   Dressing Type Foam - Lift dressing to assess site every shift 06/13/24 2000     Pressure Injury 03/06/24 Hip Anterior;Left;Proximal Unstageable - Full thickness tissue loss in which the base of the injury is covered by slough (yellow, tan, gray, green or brown) and/or eschar (tan, brown or black) in the wound bed. unstageable (Active)  03/06/24 1612  Location: Hip  Location Orientation: Anterior;Left;Proximal  Staging: Unstageable - Full thickness tissue loss in which the base of the injury is covered by slough (yellow, tan, gray, green or brown) and/or eschar (tan, brown or black) in the wound bed.  Wound Description (Comments): unstageable  DO NOT USE:  Present on Admission: No  Dressing Type Foam - Lift dressing to assess site every shift 06/13/24 2000     Pressure Injury 03/06/24 Shoulder Left Unstageable - Full thickness tissue loss in which the base of the injury is covered by slough (yellow, tan, gray, green or brown) and/or eschar (tan, brown or black) in the wound bed. unstageable (Active)  03/06/24 1613  Location: Shoulder  Location Orientation: Left  Staging: Unstageable - Full thickness tissue loss in which the base of the injury is covered by slough (yellow, tan, gray, green or brown) and/or eschar (tan, brown or black) in the wound bed.  Wound Description (Comments): unstageable  DO NOT USE:  Present on Admission: No  Dressing Type None 06/15/24 0300     Pressure Injury 04/23/24 Heel Right Unstageable - Full thickness tissue loss in which the base of the injury is covered by slough (yellow, tan, gray, green or brown) and/or eschar (tan, brown or black) in the wound bed. DTPI evolving into unstageable (Active)  04/23/24   Location: Heel  Location Orientation: Right  Staging: Unstageable - Full thickness tissue loss in which the base of the injury  is covered by slough (yellow, tan, gray, green or brown) and/or eschar (tan, brown or  black) in the wound bed.  Wound Description (Comments): DTPI evolving into unstageable  DO NOT USE:  Present on Admission: No  Dressing Type Foam - Lift dressing to assess site every shift;Silver dressings 06/15/24 0300     Pressure Injury 04/23/24 Heel Left Deep Tissue Pressure Injury - Purple or maroon localized area of discolored intact skin or blood-filled blister due to damage of underlying soft tissue from pressure and/or shear. (Active)  04/23/24   Location: Heel  Location Orientation: Left  Staging: Deep Tissue Pressure Injury - Purple or maroon localized area of discolored intact skin or blood-filled blister due to damage of underlying soft tissue from pressure and/or shear.  Wound Description (Comments):   DO NOT USE:  Present on Admission: No  Dressing Type Foam - Lift dressing to assess site every shift 06/15/24 0300    DVT prophylaxis:SCDs Start: 03/04/24 2151 Place TED hose Start: 03/04/24 2151 apixaban  (ELIQUIS ) tablet 5 mg     Code Status: Limited: Do not attempt resuscitation (DNR) -DNR-LIMITED -Do Not Intubate/DNI   Family Communication: Called and discussed with daughter Para on phone on 7/26  Patient status:Inpatient  Patient is from :home  Anticipated discharge to:not sure  Estimated DC date:not sure   Consultants: Cardiology, general surgery, PCCM  Procedures: Wound excision and debridement, thoracentesis  Antimicrobials:  Anti-infectives (From admission, onward)    Start     Dose/Rate Route Frequency Ordered Stop   04/27/24 1615  cefTAZidime  (FORTAZ ) 2 g in sodium chloride  0.9 % 100 mL IVPB        2 g 200 mL/hr over 30 Minutes Intravenous Every 8 hours 04/27/24 1518 05/03/24 2359   03/16/24 1015  cefTRIAXone  (ROCEPHIN ) 2 g in sodium chloride  0.9 % 100 mL IVPB        2 g 200 mL/hr over 30 Minutes Intravenous Every 24 hours 03/16/24 0929 03/20/24 1219   03/16/24 1015  doxycycline  (VIBRA -TABS) tablet 100 mg        100 mg Oral Every 12 hours 03/16/24  0929 03/20/24 2131   03/14/24 1000  metroNIDAZOLE  (FLAGYL ) tablet 500 mg        500 mg Oral Every 12 hours 03/14/24 0803 03/19/24 2219   03/13/24 1000  cefadroxil  (DURICEF) capsule 1,000 mg  Status:  Discontinued        1,000 mg Oral 2 times daily 03/12/24 1632 03/16/24 0929   03/07/24 2200  metroNIDAZOLE  (FLAGYL ) tablet 500 mg        500 mg Oral Every 12 hours 03/07/24 0840 03/11/24 2258   03/05/24 1930  vancomycin  (VANCOREADY) IVPB 1250 mg/250 mL  Status:  Discontinued        1,250 mg 166.7 mL/hr over 90 Minutes Intravenous Every 24 hours 03/04/24 2205 03/04/24 2224   03/05/24 1930  Vancomycin  (VANCOCIN ) 1,250 mg in sodium chloride  0.9 % 250 mL IVPB  Status:  Discontinued        1,250 mg 166.7 mL/hr over 90 Minutes Intravenous Every 24 hours 03/04/24 2224 03/05/24 0717   03/05/24 1930  vancomycin  (VANCOREADY) IVPB 1500 mg/300 mL  Status:  Discontinued        1,500 mg 150 mL/hr over 120 Minutes Intravenous Every 24 hours 03/05/24 0717 03/12/24 1636   03/05/24 1400  ceFEPIme  (MAXIPIME ) 2 g in sodium chloride  0.9 % 100 mL IVPB  Status:  Discontinued        2 g 200 mL/hr  over 30 Minutes Intravenous Every 8 hours 03/05/24 0717 03/12/24 1632   03/05/24 0600  ceFEPIme  (MAXIPIME ) 2 g in sodium chloride  0.9 % 100 mL IVPB  Status:  Discontinued        2 g 200 mL/hr over 30 Minutes Intravenous Every 12 hours 03/04/24 2205 03/05/24 0717   03/05/24 0000  metroNIDAZOLE  (FLAGYL ) IVPB 500 mg  Status:  Discontinued        500 mg 100 mL/hr over 60 Minutes Intravenous Every 12 hours 03/04/24 2206 03/04/24 2209   03/04/24 1730  aztreonam (AZACTAM) 2 g in sodium chloride  0.9 % 100 mL IVPB  Status:  Discontinued        2 g 200 mL/hr over 30 Minutes Intravenous  Once 03/04/24 1715 03/04/24 1724   03/04/24 1730  metroNIDAZOLE  (FLAGYL ) IVPB 500 mg  Status:  Discontinued        500 mg 100 mL/hr over 60 Minutes Intravenous Every 12 hours 03/04/24 1715 03/07/24 0840   03/04/24 1730  vancomycin  (VANCOREADY) IVPB  1500 mg/300 mL        1,500 mg 150 mL/hr over 120 Minutes Intravenous  Once 03/04/24 1715 03/04/24 2153   03/04/24 1730  ceFEPIme  (MAXIPIME ) 2 g in sodium chloride  0.9 % 100 mL IVPB        2 g 200 mL/hr over 30 Minutes Intravenous  Once 03/04/24 1724 03/04/24 1826       Subjective: Patient seen and examined at the bedside today.  He was lying in bed, being clean.  Appears alert and awake, comfortable.  Denies any new complaints..  Objective: Vitals:   06/15/24 0019 06/15/24 0025 06/15/24 0506 06/15/24 0823  BP: 118/77 126/74 107/71 108/70  Pulse: 84 83 78 70  Resp: (!) 29 20 (!) 21   Temp: 97.7 F (36.5 C) 97.6 F (36.4 C) (!) 97.5 F (36.4 C) (!) 97.5 F (36.4 C)  TempSrc: Oral Oral    SpO2: 100% 99% 98% 100%  Weight:      Height:        Intake/Output Summary (Last 24 hours) at 06/15/2024 1137 Last data filed at 06/15/2024 0845 Gross per 24 hour  Intake 860 ml  Output 1450 ml  Net -590 ml   Filed Weights   06/11/24 0100 06/12/24 0601 06/13/24 0128  Weight: 63 kg 63.5 kg 63.5 kg    Examination:  General exam: Overall comfortable, not in distress, chronically ill looking, deconditioned, malnourished HEENT: PERRL Respiratory system:  no wheezes or crackles  Cardiovascular system: S1 & S2 heard, RRR.  Gastrointestinal system: Abdomen is nondistended, soft and nontender. Central nervous system: Alert and oriented, residual right-sided weakness Extremities: No edema, no clubbing ,no cyanosis Skin: pressure ulcers as above   Data Reviewed: I have personally reviewed following labs and imaging studies  CBC: Recent Labs  Lab 06/09/24 0336 06/15/24 0607  WBC 5.0 5.3  HGB 11.8* 12.1*  HCT 35.0* 36.4*  MCV 85.8 87.5  PLT 193 247   Basic Metabolic Panel: Recent Labs  Lab 06/09/24 0336 06/15/24 0607  NA 136 136  K 4.3 4.4  CL 105 108  CO2 23 20*  GLUCOSE 177* 117*  BUN 27* 25*  CREATININE 0.86 0.89  CALCIUM  8.4* 8.3*     No results found for this or  any previous visit (from the past 240 hours).    Radiology Studies: No results found.  Scheduled Meds:  amiodarone   200 mg Oral Daily   apixaban   5 mg Oral BID  atorvastatin   40 mg Oral Daily   calcium  carbonate  1 tablet Oral TID   Chlorhexidine  Gluconate Cloth  6 each Topical Daily   collagenase    Topical Daily   diclofenac  Sodium  4 g Topical QID   feeding supplement  237 mL Oral TID BM   ferrous sulfate   325 mg Oral QHS   furosemide   20 mg Oral Daily   guaiFENesin   600 mg Oral BID   insulin  aspart  0-9 Units Subcutaneous TID WC   insulin  glargine-yfgn  6 Units Subcutaneous Daily   levothyroxine   50 mcg Oral Q0600   lidocaine   1 patch Transdermal Q24H   liver oil-zinc  oxide   Topical BID   multivitamin with minerals  1 tablet Oral Daily   nutrition supplement (JUVEN)  1 packet Oral BID BM   mouth rinse  15 mL Mouth Rinse 4 times per day   polyethylene glycol  17 g Oral BID   potassium chloride   20 mEq Oral Daily   senna-docusate  1 tablet Oral QHS   sertraline   50 mg Oral Daily   sodium chloride  flush  10-40 mL Intracatheter Q12H   Tafamidis   1 capsule Oral Daily   vutrisiran  sodium  25 mg Subcutaneous Q90 days   Continuous Infusions:  dextrose        LOS: 103 days   Ivonne Mustache, MD [facility] P7/26/2025, 11:37 AM

## 2024-06-16 LAB — GLUCOSE, CAPILLARY
Glucose-Capillary: 109 mg/dL — ABNORMAL HIGH (ref 70–99)
Glucose-Capillary: 188 mg/dL — ABNORMAL HIGH (ref 70–99)
Glucose-Capillary: 222 mg/dL — ABNORMAL HIGH (ref 70–99)
Glucose-Capillary: 236 mg/dL — ABNORMAL HIGH (ref 70–99)

## 2024-06-16 MED ORDER — BUTALBITAL-APAP-CAFFEINE 50-325-40 MG PO TABS: 1.0000 | ORAL_TABLET | Freq: Four times a day (QID) | ORAL | Status: DC | PRN

## 2024-06-16 NOTE — Plan of Care (Signed)
   Problem: Fluid Volume: Goal: Hemodynamic stability will improve Outcome: Progressing

## 2024-06-16 NOTE — Progress Notes (Signed)
 PROGRESS NOTE  Earl [name]  FMW:[id number] DOB: [birth year] DOA: 03/04/2024 PCP: [name], MD   Brief Narrative: Patient is a 83 year old male with history of paroxysmal A-fib, insulin -dependent diabetes, hypertension, hyperlipidemia, CVA with residual right-sided weakness, MGUS, GERD, BPH, hypothyroidism, HFrEF with EF of 40 to 45%, chronic pancytopenia who presented to the emergency department from SNF with complaint of shortness of breath, wound, pain in the sacral area.  Hospital course was remarkable for acute metabolic encephalopathy, hypotension, hypoglycemia.  Found to have worsening sacral ulcers.  General surgery consulted.Status post bedside excisional debridement of the sacral wound on 4/15 and 4/17.  Prolonged hospitalization due to reluctance of family to take him home because of pressure ulcers.They are anticipating the ulcers to improve before they can take him home  Assessment & Plan:  Principal Problem:   Severe sepsis (HCC) Active Problems:   Paroxysmal atrial fibrillation (HCC)   Sepsis (HCC)   Atrial flutter (HCC)   Sacral ulcer (HCC)   MGUS (monoclonal gammopathy of unknown significance)   Essential hypertension   Hyperlipidemia   Insulin  dependent type 2 diabetes mellitus (HCC)   History of CVA (cerebrovascular accident)   Cardiac amyloidosis (HCC)   Chronic systolic CHF (congestive heart failure) (HCC)   GERD (gastroesophageal reflux disease)   History of hypertension   Atrial fibrillation with RVR (HCC)   Community acquired pneumonia   Protein-calorie malnutrition, severe   Wild-type transthyretin-related (ATTR) amyloidosis (HCC)   Sacral decubitus ulcer, stage IV (HCC)  Sepsis secondary to sacral pressure wound/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  Hypotensive, encephalopathic, hypoglycemic, elevated lactate, leukocytosis on  presentation.  Started on IV fluid, broad spectrum antibiotics.  Cultures:NGTD.  Completed antibiotics course. 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  General surgery consulted,   PT consulted for hydrotherapy. Started  zinc , vitamin C .Status post bedside excisional debridement of the sacral wound on 4/15 and 4/17.  Also seen by ID.  Completed antibiotics course. CT on 6/16 concern for possible osteomyelitis. Surgery consulted and performed wound debridement during this hospitalization. Plastic surgery was also consulted with recommendation for continued wound care and no surgical management.Continue current wound care, continue pressure relieving therapies. Wound care nurse following. 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 The ulcer itself is less concave in appearance compared to prior. No new focal drainable fluid collection.  Also has left trochanteric ulceration with yellow sloughing. Debulked by ortho at the bedside on 7/14, wound care on board. Skin protected to the hip with VAC drape for foam bridge    Permanent  A-fib: Presented with A-fib with RVR.   Cardiology was following.   Digoxin  discontinued.  Amiodarone  changed to oral.  Eliquis   restarted.  Currently rate is well-controlled:in and out of A-fib   History of HFrEF/transthyretin cardiac amyloidosis/nonischemic cardiomyopathy/hypotension:Continue Tafamidis  and vutrisiran .Continue with lasix   Recurrent bilateral pleural effusion: Underwent left-sided thoracentesis on 6/18.  Body fluid culture without any growth.  Also underwent bilateral thoracentesis on  7/14 by pulmonology .transudative in nature   Insulin -dependent diabetes type 2: Presented with hyperglycemia.  Recent A1c in the range of 8.  Diabetic coordinator consulted.  Currently  on sliding scale, Lantus    History of CVA: Has a right-sided residual weakness.  On Eliquis , Lipitor.   GERD: Continue Protonix   Anemia of chronic disease: Continue iron supplementation   Hypothyroidism: Continue levothyroxine   Constipation: Continue bowel regimen    Severe protein calorie malnutrition/suspicion for dysphagia/hypophosphatemia:   Dietitian was consulted.  Goals of care: Very deconditioned elderly male with bedbound status, multiple comorbidities, sacral pressure ulcer.  CODE STATUS DNR.  Palliative care consulted for goals of care.   PT/OT consulted.  Recommended SNF on discharge.  TOC following for placement      Nutrition Problem: Severe Malnutrition Etiology: chronic illness Pressure Injury 02/26/24 Coccyx Mid Stage 4 - Full thickness tissue loss with exposed bone, tendon or muscle. (Active)  02/26/24 0916  Location: Coccyx  Location Orientation: Mid  Staging: Stage 4 - Full thickness tissue loss with exposed bone, tendon or muscle.  Wound Description (Comments):   DO NOT USE:  Present on Admission: Yes  Dressing Type Silver hydrofiber;Foam - Lift dressing to assess site every shift 06/15/24 0300     Pressure Injury 02/26/24 Toe (Comment  which one) Anterior;Right Deep Tissue Pressure Injury - Purple or maroon localized area of discolored intact skin or blood-filled blister due to damage of underlying soft tissue from pressure and/or shear. right gre (Active)  02/26/24 1215  Location: Toe (Comment  which one)  Location Orientation: Anterior;Right  Staging: Deep Tissue Pressure Injury - Purple or maroon localized area of discolored intact skin or blood-filled blister due to damage of underlying soft tissue from pressure and/or shear.  Wound Description (Comments): right great  toe  DO NOT USE:  Present on Admission:   Dressing Type Foam - Lift dressing to assess site every shift 06/13/24 2000     Pressure Injury 03/06/24 Hip Anterior;Left;Proximal Unstageable - Full thickness tissue loss in which the base of the injury is covered by slough (yellow, tan, gray, green or brown) and/or eschar (tan, brown or black) in the wound bed. unstageable (Active)  03/06/24 1612  Location: Hip  Location Orientation: Anterior;Left;Proximal  Staging: Unstageable - Full thickness tissue loss in which the base of the injury is covered by slough (yellow, tan, gray, green or brown) and/or eschar (tan, brown or black) in the wound bed.  Wound Description (Comments): unstageable  DO NOT USE:  Present on Admission: No  Dressing Type Foam - Lift dressing to assess site every shift 06/13/24 2000     Pressure Injury 03/06/24 Shoulder Left Unstageable - Full thickness tissue loss in which the base of the injury is covered by slough (yellow, tan, gray, green or brown) and/or eschar (tan, brown or black) in the wound bed. unstageable (Active)  03/06/24 1613  Location: Shoulder  Location Orientation: Left  Staging: Unstageable - Full thickness tissue loss in which the base of the injury is covered by slough (yellow, tan, gray, green or brown) and/or eschar (tan, brown or black) in the wound bed.  Wound Description (Comments): unstageable  DO NOT USE:  Present on Admission: No  Dressing Type None 06/15/24 0300     Pressure Injury 04/23/24 Heel Right Unstageable - Full thickness tissue loss in which the base of the injury is covered by slough (yellow, tan, gray, green or brown) and/or eschar (tan, brown or black) in the wound bed. DTPI evolving into unstageable (Active)  04/23/24   Location: Heel  Location Orientation: Right  Staging: Unstageable - Full thickness tissue loss in which the base of the injury  is covered by slough (yellow, tan, gray, green or brown) and/or eschar (tan, brown or  black) in the wound bed.  Wound Description (Comments): DTPI evolving into unstageable  DO NOT USE:  Present on Admission: No  Dressing Type Foam - Lift dressing to assess site every shift;Silver dressings 06/15/24 0300     Pressure Injury 04/23/24 Heel Left Deep Tissue Pressure Injury - Purple or maroon localized area of discolored intact skin or blood-filled blister due to damage of underlying soft tissue from pressure and/or shear. (Active)  04/23/24   Location: Heel  Location Orientation: Left  Staging: Deep Tissue Pressure Injury - Purple or maroon localized area of discolored intact skin or blood-filled blister due to damage of underlying soft tissue from pressure and/or shear.  Wound Description (Comments):   DO NOT USE:  Present on Admission: No  Dressing Type Foam - Lift dressing to assess site every shift 06/15/24 0300    DVT prophylaxis:SCDs Start: 03/04/24 2151 Place TED hose Start: 03/04/24 2151 apixaban  (ELIQUIS ) tablet 5 mg     Code Status: Limited: Do not attempt resuscitation (DNR) -DNR-LIMITED -Do Not Intubate/DNI   Family Communication: Called and discussed with daughter Para on phone on 7/26  Patient status:Inpatient  Patient is from :home  Anticipated discharge to:not sure  Estimated DC date:not sure   Consultants: Cardiology, general surgery, PCCM  Procedures: Wound excision and debridement, thoracentesis  Antimicrobials:  Anti-infectives (From admission, onward)    Start     Dose/Rate Route Frequency Ordered Stop   04/27/24 1615  cefTAZidime  (FORTAZ ) 2 g in sodium chloride  0.9 % 100 mL IVPB        2 g 200 mL/hr over 30 Minutes Intravenous Every 8 hours 04/27/24 1518 05/03/24 2359   03/16/24 1015  cefTRIAXone  (ROCEPHIN ) 2 g in sodium chloride  0.9 % 100 mL IVPB        2 g 200 mL/hr over 30 Minutes Intravenous Every 24 hours 03/16/24 0929 03/20/24 1219   03/16/24 1015  doxycycline  (VIBRA -TABS) tablet 100 mg        100 mg Oral Every 12 hours 03/16/24  0929 03/20/24 2131   03/14/24 1000  metroNIDAZOLE  (FLAGYL ) tablet 500 mg        500 mg Oral Every 12 hours 03/14/24 0803 03/19/24 2219   03/13/24 1000  cefadroxil  (DURICEF) capsule 1,000 mg  Status:  Discontinued        1,000 mg Oral 2 times daily 03/12/24 1632 03/16/24 0929   03/07/24 2200  metroNIDAZOLE  (FLAGYL ) tablet 500 mg        500 mg Oral Every 12 hours 03/07/24 0840 03/11/24 2258   03/05/24 1930  vancomycin  (VANCOREADY) IVPB 1250 mg/250 mL  Status:  Discontinued        1,250 mg 166.7 mL/hr over 90 Minutes Intravenous Every 24 hours 03/04/24 2205 03/04/24 2224   03/05/24 1930  Vancomycin  (VANCOCIN ) 1,250 mg in sodium chloride  0.9 % 250 mL IVPB  Status:  Discontinued        1,250 mg 166.7 mL/hr over 90 Minutes Intravenous Every 24 hours 03/04/24 2224 03/05/24 0717   03/05/24 1930  vancomycin  (VANCOREADY) IVPB 1500 mg/300 mL  Status:  Discontinued        1,500 mg 150 mL/hr over 120 Minutes Intravenous Every 24 hours 03/05/24 0717 03/12/24 1636   03/05/24 1400  ceFEPIme  (MAXIPIME ) 2 g in sodium chloride  0.9 % 100 mL IVPB  Status:  Discontinued        2 g 200 mL/hr  over 30 Minutes Intravenous Every 8 hours 03/05/24 0717 03/12/24 1632   03/05/24 0600  ceFEPIme  (MAXIPIME ) 2 g in sodium chloride  0.9 % 100 mL IVPB  Status:  Discontinued        2 g 200 mL/hr over 30 Minutes Intravenous Every 12 hours 03/04/24 2205 03/05/24 0717   03/05/24 0000  metroNIDAZOLE  (FLAGYL ) IVPB 500 mg  Status:  Discontinued        500 mg 100 mL/hr over 60 Minutes Intravenous Every 12 hours 03/04/24 2206 03/04/24 2209   03/04/24 1730  aztreonam (AZACTAM) 2 g in sodium chloride  0.9 % 100 mL IVPB  Status:  Discontinued        2 g 200 mL/hr over 30 Minutes Intravenous  Once 03/04/24 1715 03/04/24 1724   03/04/24 1730  metroNIDAZOLE  (FLAGYL ) IVPB 500 mg  Status:  Discontinued        500 mg 100 mL/hr over 60 Minutes Intravenous Every 12 hours 03/04/24 1715 03/07/24 0840   03/04/24 1730  vancomycin  (VANCOREADY) IVPB  1500 mg/300 mL        1,500 mg 150 mL/hr over 120 Minutes Intravenous  Once 03/04/24 1715 03/04/24 2153   03/04/24 1730  ceFEPIme  (MAXIPIME ) 2 g in sodium chloride  0.9 % 100 mL IVPB        2 g 200 mL/hr over 30 Minutes Intravenous  Once 03/04/24 1724 03/04/24 1826       Subjective: Patient seen and examined at bedside today.  He was complaining of some headache and dizziness this morning.  Vitals have been stable this morning.  Blood pressure stable.  We discussed about ordering a dose of Fioricet  Objective: Vitals:   06/16/24 0011 06/16/24 0351 06/16/24 0723 06/16/24 1152  BP: 115/75 109/70 109/66 118/67  Pulse: 76 77 72 82  Resp: 20 20 18 18   Temp: (!) 97.4 F (36.3 C) (!) 97.4 F (36.3 C) (!) 97.5 F (36.4 C) 97.6 F (36.4 C)  TempSrc:   Oral Oral  SpO2: 100% 98% 99% 99%  Weight:      Height:        Intake/Output Summary (Last 24 hours) at 06/16/2024 1157 Last data filed at 06/16/2024 0959 Gross per 24 hour  Intake 118 ml  Output 1125 ml  Net -1007 ml   Filed Weights   06/11/24 0100 06/12/24 0601 06/13/24 0128  Weight: 63 kg 63.5 kg 63.5 kg    Examination:  General exam: Overall comfortable, not in distress, chronically ill looking, deconditioned, malnourished HEENT: PERRL Respiratory system:  no wheezes or crackles  Cardiovascular system: S1 & S2 heard, RRR.  Gastrointestinal system: Abdomen is nondistended, soft and nontender. Central nervous system: Alert and oriented, residual right-sided weakness Extremities: No edema, no clubbing ,no cyanosis Skin: Pressure Ulcers as above   Data Reviewed: I have personally reviewed following labs and imaging studies  CBC: Recent Labs  Lab 06/15/24 0607  WBC 5.3  HGB 12.1*  HCT 36.4*  MCV 87.5  PLT 247   Basic Metabolic Panel: Recent Labs  Lab 06/15/24 0607  NA 136  K 4.4  CL 108  CO2 20*  GLUCOSE 117*  BUN 25*  CREATININE 0.89  CALCIUM  8.3*     No results found for this or any previous visit  (from the past 240 hours).    Radiology Studies: No results found.  Scheduled Meds:  acetaminophen   650 mg Oral Q8H   amiodarone   200 mg Oral Daily   apixaban   5 mg Oral  BID   atorvastatin   40 mg Oral Daily   calcium  carbonate  1 tablet Oral TID   collagenase    Topical Daily   diclofenac  Sodium  4 g Topical QID   feeding supplement  237 mL Oral TID BM   ferrous sulfate   325 mg Oral QHS   furosemide   20 mg Oral Daily   guaiFENesin   600 mg Oral BID   insulin  aspart  0-5 Units Subcutaneous QHS   insulin  aspart  0-9 Units Subcutaneous TID WC   insulin  glargine-yfgn  6 Units Subcutaneous Daily   levothyroxine   50 mcg Oral Q0600   lidocaine   1 patch Transdermal Q24H   liver oil-zinc  oxide   Topical BID   multivitamin with minerals  1 tablet Oral Daily   nutrition supplement (JUVEN)  1 packet Oral BID BM   mouth rinse  15 mL Mouth Rinse 4 times per day   polyethylene glycol  17 g Oral BID   potassium chloride   20 mEq Oral Daily   senna-docusate  1 tablet Oral QHS   sertraline   50 mg Oral Daily   sodium chloride  flush  10-40 mL Intracatheter Q12H   Tafamidis   1 capsule Oral Daily   vutrisiran  sodium  25 mg Subcutaneous Q90 days   Continuous Infusions:  dextrose        LOS: 104 days   Ivonne Mustache, MD [facility] P7/27/2025, 11:57 AM

## 2024-06-17 LAB — GLUCOSE, CAPILLARY
Glucose-Capillary: 80 mg/dL (ref 70–99)
Glucose-Capillary: 163 mg/dL — ABNORMAL HIGH (ref 70–99)
Glucose-Capillary: 139 mg/dL — ABNORMAL HIGH (ref 70–99)
Glucose-Capillary: 159 mg/dL — ABNORMAL HIGH (ref 70–99)

## 2024-06-17 NOTE — Progress Notes (Signed)
 Spoke with daughter via phone and gave update on patient status.

## 2024-06-17 NOTE — TOC Progression Note (Signed)
 Transition of Care [facility]) - Progression Note    Patient Details  Name: Earl [name] MRN: [id number] Date of Birth: 06-Oct-1941  [facility] CM/SW Contact  Rosaline JONELLE Joe, RN Phone Number: 06/17/2024, 2:30 PM  Clinical Narrative:    CM spoke with attending physician about patient's barriers to discharge.  I contacted Erminio Herring, [facility] Supervisor and she reached out to DJ, CM with LTAC to see if patient would be candidate for LTAC with [facility] facility due to complex wound needs since IP Care management MSW has no available SNF placement options for placement and wound care needs may be outside of SNF facilities to care for them at this time.  Hospital leadership is aware of barriers to discharge.  Risk Management is following this patient as well since patient's transfer to [facility] ago.   Expected Discharge Plan: Skilled Nursing Facility Barriers to Discharge: Continued Medical Work up               Expected Discharge Plan and Services In-house Referral: Clinical Social Work     Living arrangements for the past 2 months:  (from [facility] short term rehab)                                       Social Drivers of Health (SDOH) Interventions SDOH Screenings   Food Insecurity: No Food Insecurity (03/05/2024)  Housing: Low Risk  (03/05/2024)  Transportation Needs: No Transportation Needs (03/05/2024)  Utilities: Not At Risk (03/05/2024)  Depression (PHQ2-9): Low Risk  (10/03/2023)  Financial Resource Strain: Patient Declined (01/16/2024)  Physical Activity: Unknown (01/16/2024)  Social Connections: Moderately Integrated (03/05/2024)  Stress: Stress Concern Present (01/16/2024)  Tobacco Use: Low Risk  (05/12/2024)    Readmission Risk Interventions    03/19/2024   10:24 AM  Readmission Risk Prevention Plan  Transportation Screening Complete  Medication Review (RN Care Manager) Complete  PCP or Specialist appointment within 3-5 days  of discharge Complete  HRI or Home Care Consult Complete  SW Recovery Care/Counseling Consult Complete  Palliative Care Screening Complete  Skilled Nursing Facility Complete

## 2024-06-17 NOTE — Progress Notes (Signed)
 PROGRESS NOTE  Earl [name]  FMW:[id number] DOB: Dec 05, 1940 DOA: 03/04/2024 PCP: [name], MD   Brief Narrative: Patient is a 83 year old male with history of paroxysmal A-fib, insulin -dependent diabetes, hypertension, hyperlipidemia, CVA with residual right-sided weakness, MGUS, GERD, BPH, hypothyroidism, HFrEF with EF of 40 to 45%, chronic pancytopenia who presented to the emergency department from SNF with complaint of shortness of breath, wound, pain in the sacral area.  Hospital course was remarkable for acute metabolic encephalopathy, hypotension, hypoglycemia.  Found to have worsening sacral ulcers.  General surgery consulted.Status post bedside excisional debridement of the sacral wound on 4/15 and 4/17.  Prolonged hospitalization due to reluctance of family to take him home because of pressure ulcers.They are anticipating the ulcers to heal before they can take him home.TOC assisting with disposition  Assessment & Plan:  Principal Problem:   Severe sepsis (HCC) Active Problems:   Paroxysmal atrial fibrillation (HCC)   Sepsis (HCC)   Atrial flutter (HCC)   Sacral ulcer (HCC)   MGUS (monoclonal gammopathy of unknown significance)   Essential hypertension   Hyperlipidemia   Insulin  dependent type 2 diabetes mellitus (HCC)   History of CVA (cerebrovascular accident)   Cardiac amyloidosis (HCC)   Chronic systolic CHF (congestive heart failure) (HCC)   GERD (gastroesophageal reflux disease)   History of hypertension   Atrial fibrillation with RVR (HCC)   Community acquired pneumonia   Protein-calorie malnutrition, severe   Wild-type transthyretin-related (ATTR) amyloidosis (HCC)   Sacral decubitus ulcer, stage IV (HCC)  Sepsis secondary to sacral pressure wound/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  Hypotensive, encephalopathic, hypoglycemic, elevated  lactate, leukocytosis on presentation. Given IV fluid, broad spectrum antibiotics.  Cultures:NGTD.  Completed antibiotics course. 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  General surgery consulted,   PT consulted for hydrotherapy. Started  zinc , vitamin C .Status post bedside excisional debridement of the sacral wound on 4/15 and 4/17.  Also seen by ID.  Completed antibiotics course. CT on 6/16 concern for possible osteomyelitis. Surgery consulted and performed wound debridement during this hospitalization. Plastic surgery was also consulted with recommendation for continued wound care and no surgical management.Continue current wound care, continue pressure relieving therapies. Wound care nurse following. 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 The ulcer itself is less concave in appearance compared to prior. No new focal drainable fluid collection.  Also has left trochanteric ulceration with yellow sloughing. Debulked by ortho at the bedside on 7/14, wound care on board. Skin protected to the hip with VAC drape for foam bridge    Permanent  A-fib: Presented with A-fib with RVR.   Cardiology was following.   Digoxin  discontinued.  Amiodarone  changed to oral.  Eliquis   restarted.  Currently rate is well-controlled:mostly in NSR   History of HFrEF/transthyretin cardiac amyloidosis/nonischemic cardiomyopathy/hypotension:Continue Tafamidis  and vutrisiran .Continue with  low dose lasix   Recurrent bilateral pleural effusion: Underwent left-sided thoracentesis on 6/18.  Body fluid culture without any growth.  Also underwent  bilateral thoracentesis on 7/14 by pulmonology .transudative in nature   Insulin -dependent diabetes type 2: Presented with hyperglycemia.  Recent A1c in the range of 8.  Diabetic coordinator was  consulted.  Currently on sliding scale, Lantus    History of CVA: Has a right-sided residual weakness.  On Eliquis , Lipitor.   GERD: Continue Protonix   Anemia of chronic disease: Continue iron supplementation   Hypothyroidism: Continue levothyroxine   Constipation: Continue bowel regimen    Severe protein calorie malnutrition/suspicion for dysphagia/hypophosphatemia:   Dietitian was consulted.  Goals of care: Very deconditioned elderly male with bedbound status, multiple comorbidities, sacral pressure ulcer.  CODE STATUS DNR.  Palliative care consulted for goals of care.   PT/OT consulted.  Recommended SNF on discharge.  TOC following for placement      Nutrition Problem: Severe Malnutrition Etiology: chronic illness Pressure Injury 02/26/24 Coccyx Mid Stage 4 - Full thickness tissue loss with exposed bone, tendon or muscle. (Active)  02/26/24 0916  Location: Coccyx  Location Orientation: Mid  Staging: Stage 4 - Full thickness tissue loss with exposed bone, tendon or muscle.  Wound Description (Comments):   DO NOT USE:  Present on Admission: Yes  Dressing Type Silver hydrofiber;Foam - Lift dressing to assess site every shift 06/15/24 0300     Pressure Injury 02/26/24 Toe (Comment  which one) Anterior;Right Deep Tissue Pressure Injury - Purple or maroon localized area of discolored intact skin or blood-filled blister due to damage of underlying soft tissue from pressure and/or shear. right gre (Active)  02/26/24 1215  Location: Toe (Comment  which one)  Location Orientation: Anterior;Right  Staging: Deep Tissue Pressure Injury - Purple or maroon localized area of discolored intact skin or blood-filled blister due to damage of underlying soft tissue from pressure and/or shear.  Wound  Description (Comments): right great toe  DO NOT USE:  Present on Admission:   Dressing Type Foam - Lift dressing to assess site every shift 06/13/24 2000     Pressure Injury 03/06/24 Hip Anterior;Left;Proximal Unstageable - Full thickness tissue loss in which the base of the injury is covered by slough (yellow, tan, gray, green or brown) and/or eschar (tan, brown or black) in the wound bed. unstageable (Active)  03/06/24 1612  Location: Hip  Location Orientation: Anterior;Left;Proximal  Staging: Unstageable - Full thickness tissue loss in which the base of the injury is covered by slough (yellow, tan, gray, green or brown) and/or eschar (tan, brown or black) in the wound bed.  Wound Description (Comments): unstageable  DO NOT USE:  Present on Admission: No  Dressing Type Foam - Lift dressing to assess site every shift 06/13/24 2000     Pressure Injury 03/06/24 Shoulder Left Unstageable - Full thickness tissue loss in which the base of the injury is covered by slough (yellow, tan, gray, green or brown) and/or eschar (tan, brown or black) in the wound bed. unstageable (Active)  03/06/24 1613  Location: Shoulder  Location Orientation: Left  Staging: Unstageable - Full thickness tissue loss in which the base of the injury is covered by slough (yellow, tan, gray, green or brown) and/or eschar (tan, brown or black) in the wound bed.  Wound Description (Comments): unstageable  DO NOT USE:  Present on Admission: No  Dressing Type None 06/15/24 0300     Pressure Injury 04/23/24 Heel Right Unstageable - Full thickness tissue loss in which the base of the injury is covered by slough (yellow, tan, gray, green or brown) and/or eschar (tan, brown or black) in the wound bed. DTPI evolving into unstageable (Active)  04/23/24   Location: Heel  Location Orientation: Right  Staging: Unstageable - Full thickness tissue loss in which the base  of the injury is covered by slough (yellow, tan, gray, green or  brown) and/or eschar (tan, brown or black) in the wound bed.  Wound Description (Comments): DTPI evolving into unstageable  DO NOT USE:  Present on Admission: No  Dressing Type Foam - Lift dressing to assess site every shift;Silver dressings 06/15/24 0300     Pressure Injury 04/23/24 Heel Left Deep Tissue Pressure Injury - Purple or maroon localized area of discolored intact skin or blood-filled blister due to damage of underlying soft tissue from pressure and/or shear. (Active)  04/23/24   Location: Heel  Location Orientation: Left  Staging: Deep Tissue Pressure Injury - Purple or maroon localized area of discolored intact skin or blood-filled blister due to damage of underlying soft tissue from pressure and/or shear.  Wound Description (Comments):   DO NOT USE:  Present on Admission: No  Dressing Type Foam - Lift dressing to assess site every shift 06/15/24 0300    DVT prophylaxis:SCDs Start: 03/04/24 2151 Place TED hose Start: 03/04/24 2151 apixaban  (ELIQUIS ) tablet 5 mg     Code Status: Limited: Do not attempt resuscitation (DNR) -DNR-LIMITED -Do Not Intubate/DNI   Family Communication: Called and discussed with daughter Para on phone on 7/26  Patient status:Inpatient  Patient is from :home  Anticipated discharge to:not sure  Estimated DC date:not sure   Consultants: Cardiology, general surgery, PCCM  Procedures: Wound excision and debridement, thoracentesis  Antimicrobials:  Anti-infectives (From admission, onward)    Start     Dose/Rate Route Frequency Ordered Stop   04/27/24 1615  cefTAZidime  (FORTAZ ) 2 g in sodium chloride  0.9 % 100 mL IVPB        2 g 200 mL/hr over 30 Minutes Intravenous Every 8 hours 04/27/24 1518 05/03/24 2359   03/16/24 1015  cefTRIAXone  (ROCEPHIN ) 2 g in sodium chloride  0.9 % 100 mL IVPB        2 g 200 mL/hr over 30 Minutes Intravenous Every 24 hours 03/16/24 0929 03/20/24 1219   03/16/24 1015  doxycycline  (VIBRA -TABS) tablet 100 mg         100 mg Oral Every 12 hours 03/16/24 0929 03/20/24 2131   03/14/24 1000  metroNIDAZOLE  (FLAGYL ) tablet 500 mg        500 mg Oral Every 12 hours 03/14/24 0803 03/19/24 2219   03/13/24 1000  cefadroxil  (DURICEF) capsule 1,000 mg  Status:  Discontinued        1,000 mg Oral 2 times daily 03/12/24 1632 03/16/24 0929   03/07/24 2200  metroNIDAZOLE  (FLAGYL ) tablet 500 mg        500 mg Oral Every 12 hours 03/07/24 0840 03/11/24 2258   03/05/24 1930  vancomycin  (VANCOREADY) IVPB 1250 mg/250 mL  Status:  Discontinued        1,250 mg 166.7 mL/hr over 90 Minutes Intravenous Every 24 hours 03/04/24 2205 03/04/24 2224   03/05/24 1930  Vancomycin  (VANCOCIN ) 1,250 mg in sodium chloride  0.9 % 250 mL IVPB  Status:  Discontinued        1,250 mg 166.7 mL/hr over 90 Minutes Intravenous Every 24 hours 03/04/24 2224 03/05/24 0717   03/05/24 1930  vancomycin  (VANCOREADY) IVPB 1500 mg/300 mL  Status:  Discontinued        1,500 mg 150 mL/hr over 120 Minutes Intravenous Every 24 hours 03/05/24 0717 03/12/24 1636   03/05/24 1400  ceFEPIme  (MAXIPIME ) 2 g in sodium chloride  0.9 % 100 mL IVPB  Status:  Discontinued        2  g 200 mL/hr over 30 Minutes Intravenous Every 8 hours 03/05/24 0717 03/12/24 1632   03/05/24 0600  ceFEPIme  (MAXIPIME ) 2 g in sodium chloride  0.9 % 100 mL IVPB  Status:  Discontinued        2 g 200 mL/hr over 30 Minutes Intravenous Every 12 hours 03/04/24 2205 03/05/24 0717   03/05/24 0000  metroNIDAZOLE  (FLAGYL ) IVPB 500 mg  Status:  Discontinued        500 mg 100 mL/hr over 60 Minutes Intravenous Every 12 hours 03/04/24 2206 03/04/24 2209   03/04/24 1730  aztreonam (AZACTAM) 2 g in sodium chloride  0.9 % 100 mL IVPB  Status:  Discontinued        2 g 200 mL/hr over 30 Minutes Intravenous  Once 03/04/24 1715 03/04/24 1724   03/04/24 1730  metroNIDAZOLE  (FLAGYL ) IVPB 500 mg  Status:  Discontinued        500 mg 100 mL/hr over 60 Minutes Intravenous Every 12 hours 03/04/24 1715 03/07/24 0840    03/04/24 1730  vancomycin  (VANCOREADY) IVPB 1500 mg/300 mL        1,500 mg 150 mL/hr over 120 Minutes Intravenous  Once 03/04/24 1715 03/04/24 2153   03/04/24 1730  ceFEPIme  (MAXIPIME ) 2 g in sodium chloride  0.9 % 100 mL IVPB        2 g 200 mL/hr over 30 Minutes Intravenous  Once 03/04/24 1724 03/04/24 1826       Subjective: Patient seen and examined at bedside today.  Lying on bed.  He appears overall comfortable.  He denies  headache today but says he feels little dizzy when he raises his head from the bed.  Objective: Vitals:   06/16/24 1955 06/17/24 0014 06/17/24 0340 06/17/24 0836  BP: 104/65 122/71 109/70 104/63  Pulse: 73 68 77 66  Resp: 20 20 20    Temp: (!) 97.5 F (36.4 C) (!) 97.5 F (36.4 C) (!) 97.5 F (36.4 C) (!) 97.4 F (36.3 C)  TempSrc: Oral Oral Oral Oral  SpO2: 100% 100% 98% 100%  Weight:      Height:        Intake/Output Summary (Last 24 hours) at 06/17/2024 1055 Last data filed at 06/17/2024 0907 Gross per 24 hour  Intake 236 ml  Output 700 ml  Net -464 ml   Filed Weights   06/11/24 0100 06/12/24 0601 06/13/24 0128  Weight: 63 kg 63.5 kg 63.5 kg    Examination:  General exam: Overall comfortable, not in distress, chronically ill looking, deconditioned, malnourished HEENT: PERRL Respiratory system:  no wheezes or crackles  Cardiovascular system: S1 & S2 heard, RRR.  Gastrointestinal system: Abdomen is nondistended, soft and nontender. Central nervous system: Alert and oriented, residual right-sided weakness Extremities: No edema, no clubbing ,no cyanosis Skin: Pressure ulcers as above   Data Reviewed: I have personally reviewed following labs and imaging studies  CBC: Recent Labs  Lab 06/15/24 0607  WBC 5.3  HGB 12.1*  HCT 36.4*  MCV 87.5  PLT 247   Basic Metabolic Panel: Recent Labs  Lab 06/15/24 0607  NA 136  K 4.4  CL 108  CO2 20*  GLUCOSE 117*  BUN 25*  CREATININE 0.89  CALCIUM  8.3*     No results found for this or  any previous visit (from the past 240 hours).    Radiology Studies: No results found.  Scheduled Meds:  acetaminophen   650 mg Oral Q8H   amiodarone   200 mg Oral Daily   apixaban   5  mg Oral BID   atorvastatin   40 mg Oral Daily   calcium  carbonate  1 tablet Oral TID   collagenase    Topical Daily   diclofenac  Sodium  4 g Topical QID   feeding supplement  237 mL Oral TID BM   ferrous sulfate   325 mg Oral QHS   furosemide   20 mg Oral Daily   guaiFENesin   600 mg Oral BID   insulin  aspart  0-5 Units Subcutaneous QHS   insulin  aspart  0-9 Units Subcutaneous TID WC   insulin  glargine-yfgn  6 Units Subcutaneous Daily   levothyroxine   50 mcg Oral Q0600   lidocaine   1 patch Transdermal Q24H   liver oil-zinc  oxide   Topical BID   multivitamin with minerals  1 tablet Oral Daily   nutrition supplement (JUVEN)  1 packet Oral BID BM   mouth rinse  15 mL Mouth Rinse 4 times per day   polyethylene glycol  17 g Oral BID   potassium chloride   20 mEq Oral Daily   senna-docusate  1 tablet Oral QHS   sertraline   50 mg Oral Daily   sodium chloride  flush  10-40 mL Intracatheter Q12H   Tafamidis   1 capsule Oral Daily   vutrisiran  sodium  25 mg Subcutaneous Q90 days   Continuous Infusions:  dextrose        LOS: 105 days   Ivonne Mustache, MD [facility] P7/28/2025, 10:55 AM

## 2024-06-17 NOTE — Plan of Care (Signed)
  Problem: Fluid Volume: Goal: Hemodynamic stability will improve Outcome: Progressing   Problem: Clinical Measurements: Goal: Diagnostic test results will improve Outcome: Progressing Goal: Signs and symptoms of infection will decrease Outcome: Progressing   Problem: Education: Goal: Knowledge of risk factors and measures for prevention of condition will improve Outcome: Progressing   Problem: Coping: Goal: Psychosocial and spiritual needs will be supported Outcome: Progressing   Problem: Respiratory: Goal: Will maintain a patent airway Outcome: Progressing Goal: Complications related to the disease process, condition or treatment will be avoided or minimized Outcome: Progressing   Problem: Education: Goal: Ability to describe self-care measures that may prevent or decrease complications (Diabetes Survival Skills Education) will improve Outcome: Progressing   Problem: Coping: Goal: Ability to adjust to condition or change in health will improve Outcome: Progressing   Problem: Fluid Volume: Goal: Ability to maintain a balanced intake and output will improve Outcome: Progressing   Problem: Health Behavior/Discharge Planning: Goal: Ability to manage health-related needs will improve Outcome: Progressing   Problem: Metabolic: Goal: Ability to maintain appropriate glucose levels will improve Outcome: Progressing   Problem: Nutritional: Goal: Maintenance of adequate nutrition will improve Outcome: Progressing Goal: Progress toward achieving an optimal weight will improve Outcome: Progressing   Problem: Skin Integrity: Goal: Risk for impaired skin integrity will decrease Outcome: Progressing   Problem: Tissue Perfusion: Goal: Adequacy of tissue perfusion will improve Outcome: Progressing   Problem: Education: Goal: Knowledge of General Education information will improve Description: Including pain rating scale, medication(s)/side effects and non-pharmacologic  comfort measures Outcome: Progressing   Problem: Health Behavior/Discharge Planning: Goal: Ability to manage health-related needs will improve Outcome: Progressing   Problem: Clinical Measurements: Goal: Ability to maintain clinical measurements within normal limits will improve Outcome: Progressing Goal: Will remain free from infection Outcome: Progressing Goal: Diagnostic test results will improve Outcome: Progressing Goal: Respiratory complications will improve Outcome: Progressing Goal: Cardiovascular complication will be avoided Outcome: Progressing   Problem: Activity: Goal: Risk for activity intolerance will decrease Outcome: Progressing   Problem: Nutrition: Goal: Adequate nutrition will be maintained Outcome: Progressing   Problem: Coping: Goal: Level of anxiety will decrease Outcome: Progressing   Problem: Elimination: Goal: Will not experience complications related to bowel motility Outcome: Progressing Goal: Will not experience complications related to urinary retention Outcome: Progressing   Problem: Pain Managment: Goal: General experience of comfort will improve and/or be controlled Outcome: Progressing   Problem: Safety: Goal: Ability to remain free from injury will improve Outcome: Progressing   Problem: Skin Integrity: Goal: Risk for impaired skin integrity will decrease Outcome: Progressing

## 2024-06-18 DIAGNOSIS — R6520 Severe sepsis without septic shock: Secondary | ICD-10-CM | POA: Diagnosis not present

## 2024-06-18 DIAGNOSIS — A419 Sepsis, unspecified organism: Secondary | ICD-10-CM | POA: Diagnosis not present

## 2024-06-18 LAB — GLUCOSE, CAPILLARY
Glucose-Capillary: 129 mg/dL — ABNORMAL HIGH (ref 70–99)
Glucose-Capillary: 204 mg/dL — ABNORMAL HIGH (ref 70–99)
Glucose-Capillary: 183 mg/dL — ABNORMAL HIGH (ref 70–99)
Glucose-Capillary: 189 mg/dL — ABNORMAL HIGH (ref 70–99)

## 2024-06-18 MED ORDER — BISMUTH SUBSALICYLATE 262 MG/15ML PO SUSP
30.0000 mL | ORAL | Status: DC | PRN
  Administered 2024-06-18 – 2024-06-23 (×5): 30 mL via ORAL
  Filled 2024-06-18: qty 236

## 2024-06-18 NOTE — Progress Notes (Signed)
 Physical Therapy Treatment Patient Details Name: Earl [name] MRN: [id number] DOB: 01-11-1941 Today's Date: 06/18/2024   History of Present Illness 83 y.o. male admitted from [facility] rehab on 03/04/24 with SOB, sacral pain, Afib with RVR. Pt with acute metabolic encephalopathy, rhabdomyolysis and sepsis secondary to sacral wound infection. 4/15 & 4/17 bedside sacral wound debridement. Pt also with B pleural effusion, 6/18 L thoracentesis, 7/14 L & R thoracentesis.  PMhx: adm 3/26-4/08/2024 with fall, AMS, COVID; d/c to SNF rehab. CVA (2021, residual R-side weakness), T2DM, HTN, CHF, MGUS, Afib on Eliquis .    PT Comments  Pt initially declined PT due to abdominal pain however with encouragement agreed to working on standing but not transferring to chair. Pt with improved sitting tolerance and sit to stand transfer ability in stedy. Pt continues to be limited by abdominal pain. Acute PT to cont to follow.    If plan is discharge home, recommend the following: Two people to help with walking and/or transfers;Two people to help with bathing/dressing/bathroom;Assistance with feeding;Assist for transportation   Can travel by private vehicle     No  Equipment Recommendations  Hospital bed;Hoyer lift;Wheelchair cushion (measurements PT);Wheelchair (measurements PT) (air mattress, roho cushion)    Recommendations for Other Services       Precautions / Restrictions Precautions Precautions: Fall Recall of Precautions/Restrictions: Impaired Precaution/Restrictions Comments: sacral wound; L hip wound with wound vac, bilat heel wounds; L shoulder wound Restrictions Weight Bearing Restrictions Per Provider Order: No     Mobility  Bed Mobility Overal bed mobility: Needs Assistance Bed Mobility: Rolling, Supine to Sit, Sit to Supine Rolling: Mod assist, +2 for physical assistance Sidelying to sit: Max assist   Sit to supine: Max assist, HOB elevated   General bed mobility comments:  with increased time and effort pt is able to bring himself to EoB with use of rail, maximal cuing and modAx2    Transfers Overall transfer level: Needs assistance Equipment used: Ambulation equipment used Transfers: Sit to/from Stand Sit to Stand: Mod assist, Min assist, +2 physical assistance, From elevated surface, Via lift equipment           General transfer comment: worked on sit to stand x 4 in steady progressing from [employer] to [employer], worked on standing tolerance and upright posture    Ambulation/Gait                   Stairs             Wheelchair Mobility     Tilt Bed    Modified Rankin (Stroke Patients Only)       Balance Overall balance assessment: Needs assistance Sitting-balance support: Single extremity supported, Bilateral upper extremity supported, Feet supported Sitting balance-Leahy Scale: Fair Sitting balance - Comments: CGA for safety Postural control: Posterior lean Standing balance support: Bilateral upper extremity supported Standing balance-Leahy Scale: Zero Standing balance comment: reliant on stedy                            Communication Communication Communication: Impaired Factors Affecting Communication: Reduced clarity of speech;Difficulty expressing self  Cognition Arousal: Alert Behavior During Therapy: WFL for tasks assessed/performed   PT - Cognitive impairments: Problem solving Difficult to assess due to: Impaired communication                     PT - Cognition Comments: initially c/o feeling sick but participates with encouragement Following commands: Intact  Following commands impaired: Follows one step commands with increased time    Cueing Cueing Techniques: Verbal cues, Tactile cues, Visual cues  Exercises      General Comments General comments (skin integrity, edema, etc.): VSS, L hip wound vac intact      Pertinent Vitals/Pain Pain Assessment Pain Assessment: Faces Faces Pain  Scale: Hurts whole lot Pain Location: abdomen Pain Descriptors / Indicators: Moaning    Home Living                          Prior Function            PT Goals (current goals can now be found in the care plan section) Acute Rehab PT Goals Patient Stated Goal: move better PT Goal Formulation: With patient/family Time For Goal Achievement: 06/28/24 Potential to Achieve Goals: Fair Progress towards PT goals: Progressing toward goals    Frequency    Min 1X/week      PT Plan      Co-evaluation              AM-PAC PT 6 Clicks Mobility   Outcome Measure  Help needed turning from your back to your side while in a flat bed without using bedrails?: A Lot Help needed moving from lying on your back to sitting on the side of a flat bed without using bedrails?: A Lot Help needed moving to and from a bed to a chair (including a wheelchair)?: Total Help needed standing up from a chair using your arms (e.g., wheelchair or bedside chair)?: Total Help needed to walk in hospital room?: Total Help needed climbing 3-5 steps with a railing? : Total 6 Click Score: 8    End of Session   Activity Tolerance: Patient tolerated treatment well Patient left: with call bell/phone within reach;in bed;with bed alarm set Nurse Communication: Mobility status;Need for lift equipment PT Visit Diagnosis: Muscle weakness (generalized) (M62.81);Adult, failure to thrive (R62.7);Other abnormalities of gait and mobility (R26.89);Unsteadiness on feet (R26.81);Difficulty in walking, not elsewhere classified (R26.2) Pain - Right/Left: Left Pain - part of body: Knee     Time: 1109-1140 PT Time Calculation (min) (ACUTE ONLY): 31 min  Charges:    $Gait Training: 8-22 mins $Therapeutic Activity: 8-22 mins PT General Charges $$ ACUTE PT VISIT: 1 Visit                     Norene Ames, PT, DPT Acute Rehabilitation Services Secure chat preferred Office #: [phone]    Norene CHRISTELLA Ames 06/18/2024, 2:35 PM

## 2024-06-18 NOTE — Consult Note (Signed)
 WOC Nurse wound follow up   WOC team attempted to see patient.  Patient is awaiting his bedside nurse and is not ready/able to participate in a wound assessment at this time.  Will re-attempt.  Thank you,  Doyal Polite, RN, MSN, [facility] WOC Team

## 2024-06-18 NOTE — Inpatient Diabetes Management (Signed)
 Inpatient Diabetes Program Recommendations  AACE/ADA: New Consensus Statement on Inpatient Glycemic Control (2015)  Target Ranges:  Prepandial:   less than 140 mg/dL      Peak postprandial:   less than 180 mg/dL (1-2 hours)      Critically ill patients:  140 - 180 mg/dL   Lab Results  Component Value Date   GLUCAP 129 (H) 06/18/2024   HGBA1C 8.4 (H) 02/22/2024    Review of Glycemic Control  Latest Reference Range & Units 06/17/24 08:34 06/17/24 12:15 06/17/24 17:48 06/17/24 20:59 06/18/24 08:15  Glucose-Capillary 70 - 99 mg/dL 80 836 (H) 860 (H) 840 (H) 129 (H)   Diabetes history: DM 2 Outpatient Diabetes medications:  Metformin  500 mg bid Current orders for Inpatient glycemic control:  Semglee  6 units daily Novolog  0-9 units tid with meals and HS Inpatient Diabetes Program Recommendations:    Agree with current orders.  May want to d/c metformin  at discharge and instead do low dose basal and low dose correction insulin .  CBG's currently within goal.   **Per ADA's Standards of Medical Care of Diabetes, glycemic targets for older adults who have multiple co-morbidities, cognitive impairments, and functional dependence should be less stringent (Hgb A1c <8.0-8.5).   Thanks,  Randall Bullocks, RN, BC-ADM Inpatient Diabetes Coordinator Pager [phone]  (8a-5p)

## 2024-06-18 NOTE — TOC Progression Note (Signed)
 Transition of Care [facility]) - Progression Note    Patient Details  Name: Earl [name] MRN: [id number] Date of Birth: [birth year]  Transition of Care [facility]) CM/SW Contact  Carson Meche A [place], LCSW Phone Number: 06/18/2024, 11:31 AM  Clinical Narrative:      RNCM Stubblefield reached out to [facility] regarding pt's possible appropriateness for [facility], coordinator DJ stated that given pt's needs, may be possible and would review. Requested inpatient case management to keep him notified if family is agreeable and would assist with next steps for possible placement.    CSW and RNCM Stubblefield discussed with pt's daughter Para the option for LTACH at [facility] given pt's complex wound needs. Answered questions about what LTACH was and needs of facility. She stated that she would run the option by pt but for her stated she would need more information before being open to placement; and given his familiarity with treatment team and continued wound care adjustments she does not feel it would be ideal at this time. She stated she may visit at some point in the future and keep Inpatient case management team updated on her interest.   RNCM Stubblefield notified [facility] of possible future interest but will not proceed with placement at this time.   Inpatient case management leadership made aware.   CSW will continue to follow.    Expected Discharge Plan: Skilled Nursing Facility Barriers to Discharge: Continued Medical Work up               Expected Discharge Plan and Services In-house Referral: Clinical Social Work     Living arrangements for the past 2 months:  (from [facility] short term rehab)                                       Social Drivers of Health (SDOH) Interventions SDOH Screenings   Food Insecurity: No Food Insecurity (03/05/2024)  Housing: Low Risk  (03/05/2024)  Transportation Needs: No Transportation Needs (03/05/2024)  Utilities: Not At Risk (03/05/2024)   Depression (PHQ2-9): Low Risk  (10/03/2023)  Financial Resource Strain: Patient Declined (01/16/2024)  Physical Activity: Unknown (01/16/2024)  Social Connections: Moderately Integrated (03/05/2024)  Stress: Stress Concern Present (01/16/2024)  Tobacco Use: Low Risk  (05/12/2024)    Readmission Risk Interventions    03/19/2024   10:24 AM  Readmission Risk Prevention Plan  Transportation Screening Complete  Medication Review (RN Care Manager) Complete  PCP or Specialist appointment within 3-5 days of discharge Complete  HRI or Home Care Consult Complete  SW Recovery Care/Counseling Consult Complete  Palliative Care Screening Complete  Skilled Nursing Facility Complete

## 2024-06-18 NOTE — Plan of Care (Signed)
  Problem: Fluid Volume: Goal: Hemodynamic stability will improve Outcome: Progressing   Problem: Clinical Measurements: Goal: Diagnostic test results will improve Outcome: Progressing Goal: Signs and symptoms of infection will decrease Outcome: Progressing   Problem: Education: Goal: Knowledge of risk factors and measures for prevention of condition will improve Outcome: Progressing   Problem: Coping: Goal: Psychosocial and spiritual needs will be supported Outcome: Progressing   Problem: Respiratory: Goal: Will maintain a patent airway Outcome: Progressing Goal: Complications related to the disease process, condition or treatment will be avoided or minimized Outcome: Progressing   Problem: Education: Goal: Ability to describe self-care measures that may prevent or decrease complications (Diabetes Survival Skills Education) will improve Outcome: Progressing   Problem: Coping: Goal: Ability to adjust to condition or change in health will improve Outcome: Progressing   Problem: Fluid Volume: Goal: Ability to maintain a balanced intake and output will improve Outcome: Progressing   Problem: Health Behavior/Discharge Planning: Goal: Ability to manage health-related needs will improve Outcome: Progressing   Problem: Metabolic: Goal: Ability to maintain appropriate glucose levels will improve Outcome: Progressing   Problem: Nutritional: Goal: Maintenance of adequate nutrition will improve Outcome: Progressing Goal: Progress toward achieving an optimal weight will improve Outcome: Progressing   Problem: Skin Integrity: Goal: Risk for impaired skin integrity will decrease Outcome: Progressing   Problem: Tissue Perfusion: Goal: Adequacy of tissue perfusion will improve Outcome: Progressing   Problem: Education: Goal: Knowledge of General Education information will improve Description: Including pain rating scale, medication(s)/side effects and non-pharmacologic  comfort measures Outcome: Progressing   Problem: Health Behavior/Discharge Planning: Goal: Ability to manage health-related needs will improve Outcome: Progressing   Problem: Clinical Measurements: Goal: Ability to maintain clinical measurements within normal limits will improve Outcome: Progressing Goal: Will remain free from infection Outcome: Progressing Goal: Diagnostic test results will improve Outcome: Progressing Goal: Respiratory complications will improve Outcome: Progressing Goal: Cardiovascular complication will be avoided Outcome: Progressing   Problem: Activity: Goal: Risk for activity intolerance will decrease Outcome: Progressing   Problem: Nutrition: Goal: Adequate nutrition will be maintained Outcome: Progressing   Problem: Coping: Goal: Level of anxiety will decrease Outcome: Progressing   Problem: Elimination: Goal: Will not experience complications related to bowel motility Outcome: Progressing Goal: Will not experience complications related to urinary retention Outcome: Progressing   Problem: Pain Managment: Goal: General experience of comfort will improve and/or be controlled Outcome: Progressing   Problem: Safety: Goal: Ability to remain free from injury will improve Outcome: Progressing   Problem: Skin Integrity: Goal: Risk for impaired skin integrity will decrease Outcome: Progressing

## 2024-06-18 NOTE — Consult Note (Signed)
 WOC Nurse wound follow up Wound type: Unstageable Pressure Injury: left shoulder Unstageable Pressure Injury: left trochanter Unstageable Pressure Injury; right heel Stage 4 Pressure Injury; sacrum Open area to left medial foot Measurement: Left shoulder: 3 cm x 2 cm  Left trochanter: 3 cm x 3 cm x 1.5 cm Right heel: 3 cm x 4 cm Sacrum: 6 cm x 3 cm x 0.5 cm with 2 cm undermining from 12 o'clock to 3 o'clock Left medial foot: small opening less than 0.1 cm Wound bed: Left shoulder: 100% covered with dry eschar Left trochanter: 100% covered with yellow slough Right heel: 100% covered with black eschar Sacrum: pink/red wound bed Left medial foot: small opening less than 0.1 cm- unable to visualize wound bed  Drainage (amount, consistency, odor)  Left shoulder; none Left trochanter: moderate,thin,  brown/green  Right heel: none Sacrum: serosanguinous moderate amount Left medial foot: purulent, scant  Periwound:Intact Dressing procedure/placement/frequency: 1- Left shoulder Unstageable   Paint with Betadine and leave open to air, daily. 2- Left trochanter: Cleanse with Vashe (lawson # [id number]) allow to aid dry.  Apply 1/4 thick layer of Santyl  to wound bed, top with saline moist gauze. Top with silicone foam.  Ok to lift foam daily for change of gauze and reapplication of Santyl . Ok to change silicone foam every 3 days.     3 - Sacral Stage 4 Pressure Injury  Cleanse with Vashe#[id number]. Apply Aquacel Ag+ (silver hydrofiber) Lawson # [id number] in the wound bed and fill the undermining using a swab, cover with sacral foam dressing, change daily and PRN soilage  3- Right heel; Unstageable; paint with betadine and leave open to air; offload with pillows or Prevalon boots at all times  4. L medial foot: Cleanse with NS, allow to air dry, cover wound with Xeroform and cover with foam dressing, change daily; Offload L heel with Prevalon boots   LALM in place for moisture management and pressure  redistribution.  WOC Nurse team will follow with you and see patient within 10 days for wound assessments.  Please notify WOC nurses of any acute changes in the wounds or any new areas of concern  Doyal Polite, RN, MSN, [facility] WOC Team

## 2024-06-18 NOTE — Progress Notes (Signed)
 PROGRESS NOTE  Earl [name]  FMW:[id number] DOB: February 26, 1941 DOA: 03/04/2024 PCP: [name], MD   Brief Narrative: Patient is a 83 year old male with history of paroxysmal A-fib, insulin -dependent diabetes, hypertension, hyperlipidemia, CVA with residual right-sided weakness, MGUS, GERD, BPH, hypothyroidism, HFrEF with EF of 40 to 45%, chronic pancytopenia who presented to the emergency department from SNF with complaint of shortness of breath, wound, pain in the sacral area.  Hospital course was remarkable for acute metabolic encephalopathy, hypotension, hypoglycemia.  Found to have worsening sacral ulcers.  General surgery consulted.Status post bedside excisional debridement of the sacral wound on 4/15 and 4/17.  Prolonged hospitalization due to reluctance of family to take him home because of pressure ulcers.They are anticipating the ulcers to heal before they can take him home.TOC assisting with disposition  Assessment & Plan:  Principal Problem:   Severe sepsis (HCC) Active Problems:   Paroxysmal atrial fibrillation (HCC)   Sepsis (HCC)   Atrial flutter (HCC)   Sacral ulcer (HCC)   MGUS (monoclonal gammopathy of unknown significance)   Essential hypertension   Hyperlipidemia   Insulin  dependent type 2 diabetes mellitus (HCC)   History of CVA (cerebrovascular accident)   Cardiac amyloidosis (HCC)   Chronic systolic CHF (congestive heart failure) (HCC)   GERD (gastroesophageal reflux disease)   History of hypertension   Atrial fibrillation with RVR (HCC)   Community acquired pneumonia   Protein-calorie malnutrition, severe   Wild-type transthyretin-related (ATTR) amyloidosis (HCC)   Sacral decubitus ulcer, stage IV (HCC)  Sepsis secondary to sacral pressure wound/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  Hypotensive, encephalopathic, hypoglycemic, elevated  lactate, leukocytosis on presentation. Given IV fluid, broad spectrum antibiotics.  Cultures:NGTD.  Completed antibiotics course. 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  General surgery consulted,   PT consulted for hydrotherapy. Started  zinc , vitamin C .Status post bedside excisional debridement of the sacral wound on 4/15 and 4/17.  Also seen by ID.  Completed antibiotics course. CT on 6/16 concern for possible osteomyelitis. Surgery consulted and performed wound debridement during this hospitalization. Plastic surgery was also consulted with recommendation for continued wound care and no surgical management.Continue current wound care, continue pressure relieving therapies. Wound care nurse following. 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 The ulcer itself is less concave in appearance compared to prior. No new focal drainable fluid collection.  Also has left trochanteric ulceration with yellow sloughing. Debulked by ortho at the bedside on 7/14, wound care on board. Skin protected to the hip with VAC drape for foam bridge    Permanent  A-fib: Presented with A-fib with RVR.   Cardiology was following.   Digoxin  discontinued.  Amiodarone  changed to oral.  Eliquis   restarted.  Currently rate is well-controlled:mostly in NSR   History of HFrEF/transthyretin cardiac amyloidosis/nonischemic cardiomyopathy/hypotension:Continue Tafamidis  and vutrisiran .Continue with  low dose lasix   Recurrent bilateral pleural effusion: Underwent left-sided thoracentesis on 6/18.  Body fluid culture without any growth.  Also underwent  bilateral thoracentesis on 7/14 by pulmonology .Pleural fluid transudative in nature   Insulin -dependent diabetes type 2: Presented with hyperglycemia.  Recent A1c in the range of 8.  Diabetic  coordinator was consulted.  Currently on sliding scale, Lantus    History of CVA: Has a right-sided residual weakness.  On Eliquis , Lipitor.  Complains of intermittent dizziness.  Already on meclizine    GERD: Continue Protonix   Anemia of chronic disease: Continue iron supplementation   Hypothyroidism: Continue levothyroxine   Constipation: Continue bowel regimen    Severe protein calorie malnutrition/suspicion for dysphagia/hypophosphatemia:   Dietitian was consulted.  Goals of care: Very deconditioned elderly male with bedbound status, multiple comorbidities, sacral pressure ulcer.  CODE STATUS DNR.  Palliative care consulted for goals of care.   PT/OT consulted.  Recommended SNF on discharge.  TOC following for placement      Nutrition Problem: Severe Malnutrition Etiology: chronic illness Pressure Injury 02/26/24 Coccyx Mid Stage 4 - Full thickness tissue loss with exposed bone, tendon or muscle. (Active)  02/26/24 0916  Location: Coccyx  Location Orientation: Mid  Staging: Stage 4 - Full thickness tissue loss with exposed bone, tendon or muscle.  Wound Description (Comments):   DO NOT USE:  Present on Admission: Yes  Dressing Type Foam - Lift dressing to assess site every shift 06/17/24 2043     Pressure Injury 02/26/24 Toe (Comment  which one) Anterior;Right Deep Tissue Pressure Injury - Purple or maroon localized area of discolored intact skin or blood-filled blister due to damage of underlying soft tissue from pressure and/or shear. right gre (Active)  02/26/24 1215  Location: Toe (Comment  which one)  Location Orientation: Anterior;Right  Staging: Deep Tissue Pressure Injury - Purple or maroon localized area of discolored intact skin or blood-filled blister due to damage of  underlying soft tissue from pressure and/or shear.  Wound Description (Comments): right great toe  DO NOT USE:  Present on Admission:   Dressing Type Foam - Lift dressing to assess site every shift 06/13/24 2000     Pressure Injury 03/06/24 Hip Anterior;Left;Proximal Unstageable - Full thickness tissue loss in which the base of the injury is covered by slough (yellow, tan, gray, green or brown) and/or eschar (tan, brown or black) in the wound bed. unstageable (Active)  03/06/24 1612  Location: Hip  Location Orientation: Anterior;Left;Proximal  Staging: Unstageable - Full thickness tissue loss in which the base of the injury is covered by slough (yellow, tan, gray, green or brown) and/or eschar (tan, brown or black) in the wound bed.  Wound Description (Comments): unstageable  DO NOT USE:  Present on Admission: No  Dressing Type Foam - Lift dressing to assess site every shift;Silver hydrofiber 06/17/24 1500     Pressure Injury 03/06/24 Shoulder Left Unstageable - Full thickness tissue loss in which the base of the injury is covered by slough (yellow, tan, gray, green or brown) and/or eschar (tan, brown or black) in the wound bed. unstageable (Active)  03/06/24 1613  Location: Shoulder  Location Orientation: Left  Staging: Unstageable - Full thickness tissue loss in which the base of the injury is covered by slough (yellow, tan, gray, green or brown) and/or eschar (tan, brown or black) in the wound bed.  Wound Description (Comments): unstageable  DO NOT USE:  Present on Admission: No  Dressing Type None 06/15/24 0300     Pressure Injury 04/23/24 Heel Right Unstageable - Full thickness tissue loss in which the base of the injury is covered by slough (yellow, tan, gray, green or brown) and/or eschar (tan, brown or black) in the wound bed. DTPI evolving into unstageable (Active)  04/23/24   Location: Heel  Location Orientation: Right  Staging: Unstageable - Full thickness tissue loss in which  the base of the injury is covered by slough (yellow, tan, gray, green or brown) and/or eschar (tan, brown or black) in the wound bed.  Wound Description (Comments): DTPI evolving into unstageable  DO NOT USE:  Present on Admission: No  Dressing Type Foam - Lift dressing to assess site every shift;Silver dressings 06/15/24 0300     Pressure Injury 04/23/24 Heel Left Deep Tissue Pressure Injury - Purple or maroon localized area of discolored intact skin or blood-filled blister due to damage of underlying soft tissue from pressure and/or shear. (Active)  04/23/24   Location: Heel  Location Orientation: Left  Staging: Deep Tissue Pressure Injury - Purple or maroon localized area of discolored intact skin or blood-filled blister due to damage of underlying soft tissue from pressure and/or shear.  Wound Description (Comments):   DO NOT USE:  Present on Admission: No  Dressing Type Foam - Lift dressing to assess site every shift 06/15/24 0300    DVT prophylaxis:SCDs Start: 03/04/24 2151 Place TED hose Start: 03/04/24 2151 apixaban  (ELIQUIS ) tablet 5 mg     Code Status: Limited: Do not attempt resuscitation (DNR) -DNR-LIMITED -Do Not Intubate/DNI   Family Communication: Called and discussed with daughter Para on phone on 7/26  Patient status:Inpatient  Patient is from :home  Anticipated discharge to:not sure  Estimated DC date:not sure   Consultants: Cardiology, general surgery, PCCM  Procedures: Wound excision and debridement, thoracentesis  Antimicrobials:  Anti-infectives (From admission, onward)    Start     Dose/Rate Route Frequency Ordered Stop   04/27/24 1615  cefTAZidime  (FORTAZ ) 2 g in sodium chloride  0.9 % 100 mL IVPB        2 g 200 mL/hr over 30 Minutes Intravenous Every 8 hours 04/27/24 1518 05/03/24 2359   03/16/24 1015  cefTRIAXone  (ROCEPHIN ) 2 g in sodium chloride  0.9 % 100 mL IVPB        2 g 200 mL/hr over 30 Minutes Intravenous Every 24 hours 03/16/24 0929  03/20/24 1219   03/16/24 1015  doxycycline  (VIBRA -TABS) tablet 100 mg        100 mg Oral Every 12 hours 03/16/24 0929 03/20/24 2131   03/14/24 1000  metroNIDAZOLE  (FLAGYL ) tablet 500 mg        500 mg Oral Every 12 hours 03/14/24 0803 03/19/24 2219   03/13/24 1000  cefadroxil  (DURICEF) capsule 1,000 mg  Status:  Discontinued        1,000 mg Oral 2 times daily 03/12/24 1632 03/16/24 0929   03/07/24 2200  metroNIDAZOLE  (FLAGYL ) tablet 500 mg        500 mg Oral Every 12 hours 03/07/24 0840 03/11/24 2258   03/05/24 1930  vancomycin  (VANCOREADY) IVPB 1250 mg/250 mL  Status:  Discontinued        1,250 mg 166.7 mL/hr over 90 Minutes Intravenous Every 24 hours 03/04/24 2205 03/04/24 2224   03/05/24 1930  Vancomycin  (VANCOCIN ) 1,250 mg in sodium chloride  0.9 % 250 mL IVPB  Status:  Discontinued        1,250 mg 166.7 mL/hr over 90 Minutes Intravenous Every 24 hours 03/04/24 2224 03/05/24 0717   03/05/24 1930  vancomycin  (VANCOREADY) IVPB 1500 mg/300 mL  Status:  Discontinued        1,500 mg 150 mL/hr over 120 Minutes Intravenous Every 24 hours 03/05/24 0717 03/12/24 1636   03/05/24 1400  ceFEPIme  (MAXIPIME ) 2 g in sodium chloride  0.9 % 100 mL IVPB  Status:  Discontinued        2 g 200 mL/hr over 30 Minutes Intravenous Every 8 hours 03/05/24 0717 03/12/24 1632   03/05/24 0600  ceFEPIme  (MAXIPIME ) 2 g in sodium chloride  0.9 % 100 mL IVPB  Status:  Discontinued        2 g 200 mL/hr over 30 Minutes Intravenous Every 12 hours 03/04/24 2205 03/05/24 0717   03/05/24 0000  metroNIDAZOLE  (FLAGYL ) IVPB 500 mg  Status:  Discontinued        500 mg 100 mL/hr over 60 Minutes Intravenous Every 12 hours 03/04/24 2206 03/04/24 2209   03/04/24 1730  aztreonam (AZACTAM) 2 g in sodium chloride  0.9 % 100 mL IVPB  Status:  Discontinued        2 g 200 mL/hr over 30 Minutes Intravenous  Once 03/04/24 1715 03/04/24 1724   03/04/24 1730  metroNIDAZOLE  (FLAGYL ) IVPB 500 mg  Status:  Discontinued        500 mg 100 mL/hr  over 60 Minutes Intravenous Every 12 hours 03/04/24 1715 03/07/24 0840   03/04/24 1730  vancomycin  (VANCOREADY) IVPB 1500 mg/300 mL        1,500 mg 150 mL/hr over 120 Minutes Intravenous  Once 03/04/24 1715 03/04/24 2153   03/04/24 1730  ceFEPIme  (MAXIPIME ) 2 g in sodium chloride  0.9 % 100 mL IVPB        2 g 200 mL/hr over 30 Minutes Intravenous  Once 03/04/24 1724 03/04/24 1826       Subjective: Patient seen and examined at the bedside today.  Overall comfortable.  Hemodynamically stable.  Complains of dizziness when raising his head.  Patient is mostly bedbound and does not have any kind of discomfort while lying down and putting his head on the pillow  Objective: Vitals:   06/17/24 1934 06/18/24 0037 06/18/24 0430 06/18/24 0817  BP: 137/86 114/66 113/75 112/72  Pulse:  80 81 79  Resp: (!) 29 (!) 23 17   Temp:  97.7 F (36.5 C) 97.6 F (36.4 C) 97.7 F (36.5 C)  TempSrc:  Oral    SpO2:  100% 100% 100%  Weight:      Height:        Intake/Output Summary (Last 24 hours) at 06/18/2024 1124 Last data filed at 06/18/2024 0845 Gross per 24 hour  Intake 406 ml  Output 350 ml  Net 56 ml   Filed Weights   06/11/24 0100 06/12/24 0601 06/13/24 0128  Weight: 63 kg 63.5 kg 63.5 kg    Examination:   General exam: Overall comfortable, not in distress, chronically ill looking, deconditioned, malnourished HEENT: PERRL Respiratory system:  no wheezes or crackles  Cardiovascular system: S1 & S2 heard, RRR.  Gastrointestinal system: Abdomen is nondistended, soft and nontender.  Residual right-sided weakness Central nervous system: Alert and oriented Extremities: No edema, no clubbing ,no cyanosis Skin: Pressure ulcers as above   Data Reviewed: I have personally reviewed following labs and imaging studies  CBC: Recent Labs  Lab 06/15/24 0607  WBC 5.3  HGB 12.1*  HCT 36.4*  MCV 87.5  PLT 247   Basic Metabolic Panel: Recent Labs  Lab 06/15/24 0607  NA 136  K 4.4  CL  108  CO2 20*  GLUCOSE 117*  BUN 25*  CREATININE 0.89  CALCIUM  8.3*     No results found for this or any previous visit (from the past 240 hours).    Radiology Studies: No results found.  Scheduled Meds:  acetaminophen   650 mg Oral Q8H   amiodarone   200 mg Oral Daily   apixaban   5 mg Oral BID   atorvastatin   40 mg Oral Daily   calcium  carbonate  1 tablet Oral TID   collagenase    Topical Daily   diclofenac  Sodium  4 g Topical QID   feeding supplement  237 mL Oral TID BM   ferrous sulfate   325 mg Oral QHS   furosemide   20 mg Oral Daily   guaiFENesin   600 mg Oral BID   insulin  aspart  0-5 Units Subcutaneous QHS   insulin  aspart  0-9 Units Subcutaneous TID WC   insulin  glargine-yfgn  6 Units Subcutaneous Daily   levothyroxine   50 mcg Oral Q0600   lidocaine   1 patch Transdermal Q24H   liver oil-zinc  oxide   Topical BID   multivitamin with minerals  1 tablet Oral Daily   nutrition supplement (JUVEN)  1 packet Oral BID BM   mouth rinse  15 mL Mouth Rinse 4 times per day   polyethylene glycol  17 g Oral BID   potassium chloride   20 mEq Oral Daily   senna-docusate  1 tablet Oral QHS   sertraline   50 mg Oral Daily   sodium chloride  flush  10-40 mL Intracatheter Q12H   Tafamidis   1 capsule Oral Daily   vutrisiran  sodium  25 mg Subcutaneous Q90 days   Continuous Infusions:  dextrose        LOS: 106 days   Ivonne Mustache, MD [facility] P7/29/2025, 11:24 AM

## 2024-06-19 ENCOUNTER — Inpatient Hospital Stay (HOSPITAL_COMMUNITY)

## 2024-06-19 DIAGNOSIS — A419 Sepsis, unspecified organism: Secondary | ICD-10-CM | POA: Diagnosis not present

## 2024-06-19 DIAGNOSIS — R6520 Severe sepsis without septic shock: Secondary | ICD-10-CM | POA: Diagnosis not present

## 2024-06-19 HISTORY — PX: IR THORACENTESIS ASP PLEURAL SPACE W/IMG GUIDE: IMG5380

## 2024-06-19 LAB — GLUCOSE, CAPILLARY
Glucose-Capillary: 155 mg/dL — ABNORMAL HIGH (ref 70–99)
Glucose-Capillary: 236 mg/dL — ABNORMAL HIGH (ref 70–99)
Glucose-Capillary: 186 mg/dL — ABNORMAL HIGH (ref 70–99)
Glucose-Capillary: 131 mg/dL — ABNORMAL HIGH (ref 70–99)

## 2024-06-19 MED ORDER — LIDOCAINE HCL 1 % IJ SOLN
20.0000 mL | Freq: Once | INTRAMUSCULAR | Status: AC
  Administered 2024-06-19: 10 mL via INTRADERMAL

## 2024-06-19 MED ORDER — LIDOCAINE HCL 1 % IJ SOLN
INTRAMUSCULAR | Status: AC
  Filled 2024-06-19: qty 20

## 2024-06-19 NOTE — Procedures (Signed)
 Ultrasound-guided therapeutic Right sided thoracentesis performed yielding 500 milliliters of amber colored fluid. No immediate complications.   . Follow-up chest x-ray pending. EBL is < 2 ml.

## 2024-06-19 NOTE — Progress Notes (Signed)
 PROGRESS NOTE  Earl [name]  FMW:[id number] DOB: 05-17-1941 DOA: 03/04/2024 PCP: [name], MD   Brief Narrative: Patient is a 83 year old male with history of paroxysmal A-fib, insulin -dependent diabetes, hypertension, hyperlipidemia, CVA with residual right-sided weakness, MGUS, GERD, BPH, hypothyroidism, HFrEF with EF of 40 to 45%, chronic pancytopenia who presented to the emergency department from SNF with complaint of shortness of breath, wound, pain in the sacral area.  Hospital course was remarkable for acute metabolic encephalopathy, hypotension, hypoglycemia.  Found to have worsening sacral ulcers.  General surgery consulted.Status post bedside excisional debridement of the sacral wound on 4/15 and 4/17.  Prolonged hospitalization due to reluctance of family to take him home because of pressure ulcers.They are anticipating the ulcers to heal before they can take him home.TOC assisting with disposition  Assessment & Plan:  Principal Problem:   Severe sepsis (HCC) Active Problems:   Paroxysmal atrial fibrillation (HCC)   Sepsis (HCC)   Atrial flutter (HCC)   Sacral ulcer (HCC)   MGUS (monoclonal gammopathy of unknown significance)   Essential hypertension   Hyperlipidemia   Insulin  dependent type 2 diabetes mellitus (HCC)   History of CVA (cerebrovascular accident)   Cardiac amyloidosis (HCC)   Chronic systolic CHF (congestive heart failure) (HCC)   GERD (gastroesophageal reflux disease)   History of hypertension   Atrial fibrillation with RVR (HCC)   Community acquired pneumonia   Protein-calorie malnutrition, severe   Wild-type transthyretin-related (ATTR) amyloidosis (HCC)   Sacral decubitus ulcer, stage IV (HCC)  Sepsis secondary to sacral pressure wound/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  Hypotensive, encephalopathic, hypoglycemic, elevated  lactate, leukocytosis on presentation. Given IV fluid, broad spectrum antibiotics.  Cultures:NGTD.  Completed antibiotics course. 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  General surgery consulted,   PT consulted for hydrotherapy. Started  zinc , vitamin C .Status post bedside excisional debridement of the sacral wound on 4/15 and 4/17.  Also seen by ID.  Completed antibiotics course. CT on 6/16 concern for possible osteomyelitis. Surgery consulted and performed wound debridement during this hospitalization. Plastic surgery was also consulted with recommendation for continued wound care and no surgical management.Continue current wound care, continue pressure relieving therapies. Wound care nurse following. 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 The ulcer itself is less concave in appearance compared to prior. No new focal drainable fluid collection.  Also has left trochanteric ulceration with yellow sloughing. Debulked by ortho at the bedside on 7/14, wound care on board. Skin protected to the hip with VAC drape for foam bridge    Permanent  A-fib: Presented with A-fib with RVR.   Cardiology was following.   Digoxin  discontinued.  Amiodarone  changed to oral.  Eliquis   restarted.  Currently rate is well-controlled:mostly in NSR   History of HFrEF/transthyretin cardiac amyloidosis/nonischemic cardiomyopathy/hypotension:Continue Tafamidis  and vutrisiran .Continue with  low dose lasix   Recurrent bilateral pleural effusion: Underwent left-sided thoracentesis on 6/18.  Body fluid culture without any growth.  Also underwent  bilateral thoracentesis on 7/14 by pulmonology .Pleural fluid was transudative in nature  CXR last night again showed bilateral pleural effusions,more on right, he is on room air. We will  attempt US  thoracentesis on right  Insulin -dependent diabetes type 2: Presented with hyperglycemia.  Recent A1c in the range of 8.  Diabetic coordinator was consulted.  Currently on sliding scale, Lantus    History of CVA: Has a right-sided residual weakness.  On Eliquis , Lipitor.  Complains of intermittent dizziness.  Already on meclizine    GERD: Continue Protonix   Anemia of chronic disease: Continue iron supplementation   Hypothyroidism: Continue levothyroxine   Constipation: Continue bowel regimen    Severe protein calorie malnutrition/suspicion for dysphagia/hypophosphatemia:   Dietitian was consulted.  Goals of care: Very deconditioned elderly male with bedbound status, multiple comorbidities, sacral pressure ulcer.  CODE STATUS DNR.  Palliative care consulted for goals of care.   PT/OT consulted.  Recommended SNF on discharge.  TOC following for placement      Nutrition Problem: Severe Malnutrition Etiology: chronic illness Pressure Injury 02/26/24 Coccyx Mid Stage 4 - Full thickness tissue loss with exposed bone, tendon or muscle. (Active)  02/26/24 0916  Location: Coccyx  Location Orientation: Mid  Staging: Stage 4 - Full thickness tissue loss with exposed bone, tendon or muscle.  Wound Description (Comments):   DO NOT USE:  Present on Admission: Yes  Dressing Type Foam - Lift dressing to assess site every shift 06/18/24 1943     Pressure Injury 02/26/24 Toe (Comment  which one) Anterior;Right Deep Tissue Pressure Injury - Purple or maroon localized area of discolored intact skin or blood-filled blister due to damage of underlying soft tissue from pressure and/or shear. right gre (Active)  02/26/24 1215  Location: Toe (Comment  which one)  Location Orientation: Anterior;Right  Staging:  Deep Tissue Pressure Injury - Purple or maroon localized area of discolored intact skin or blood-filled blister due to damage of underlying soft tissue from pressure and/or shear.  Wound Description (Comments): right great toe  DO NOT USE:  Present on Admission:   Dressing Type Foam - Lift dressing to assess site every shift 06/13/24 2000     Pressure Injury 03/06/24 Hip Anterior;Left;Proximal Unstageable - Full thickness tissue loss in which the base of the injury is covered by slough (yellow, tan, gray, green or brown) and/or eschar (tan, brown or black) in the wound bed. unstageable (Active)  03/06/24 1612  Location: Hip  Location Orientation: Anterior;Left;Proximal  Staging: Unstageable - Full thickness tissue loss in which the base of the injury is covered by slough (yellow, tan, gray, green or brown) and/or eschar (tan, brown or black) in the wound bed.  Wound Description (Comments): unstageable  DO NOT USE:  Present on Admission: No  Dressing Type Foam - Lift dressing to assess site every shift 06/18/24 1943     Pressure Injury 03/06/24 Shoulder Left Unstageable - Full thickness tissue loss in which the base of the injury is covered by slough (yellow, tan, gray, green or brown) and/or eschar (tan, brown or black) in the wound bed. unstageable (Active)  03/06/24 1613  Location: Shoulder  Location Orientation: Left  Staging: Unstageable - Full thickness tissue loss in which the base of the injury is covered by slough (yellow, tan, gray, green or brown) and/or eschar (tan, brown or black) in the wound bed.  Wound Description (Comments): unstageable  DO NOT USE:  Present on Admission: No  Dressing Type Foam - Lift dressing to assess site every shift 06/18/24 1943     Pressure Injury 04/23/24 Heel Right Unstageable - Full thickness tissue loss in which the base of the injury is covered by slough (yellow, tan,  gray, green or brown) and/or eschar (tan, brown or black) in the wound bed. DTPI  evolving into unstageable (Active)  04/23/24   Location: Heel  Location Orientation: Right  Staging: Unstageable - Full thickness tissue loss in which the base of the injury is covered by slough (yellow, tan, gray, green or brown) and/or eschar (tan, brown or black) in the wound bed.  Wound Description (Comments): DTPI evolving into unstageable  DO NOT USE:  Present on Admission: No  Dressing Type Foam - Lift dressing to assess site every shift 06/18/24 1943     Pressure Injury 04/23/24 Heel Left Deep Tissue Pressure Injury - Purple or maroon localized area of discolored intact skin or blood-filled blister due to damage of underlying soft tissue from pressure and/or shear. (Active)  04/23/24   Location: Heel  Location Orientation: Left  Staging: Deep Tissue Pressure Injury - Purple or maroon localized area of discolored intact skin or blood-filled blister due to damage of underlying soft tissue from pressure and/or shear.  Wound Description (Comments):   DO NOT USE:  Present on Admission: No  Dressing Type Foam - Lift dressing to assess site every shift 06/18/24 1943    DVT prophylaxis:SCDs Start: 03/04/24 2151 Place TED hose Start: 03/04/24 2151 apixaban  (ELIQUIS ) tablet 5 mg     Code Status: Limited: Do not attempt resuscitation (DNR) -DNR-LIMITED -Do Not Intubate/DNI   Family Communication: Called and discussed with daughter Para on phone on 7/26  Patient status:Inpatient  Patient is from :home  Anticipated discharge to:not sure  Estimated DC date:not sure   Consultants: Cardiology, general surgery, PCCM  Procedures: Wound excision and debridement, thoracentesis  Antimicrobials:  Anti-infectives (From admission, onward)    Start     Dose/Rate Route Frequency Ordered Stop   04/27/24 1615  cefTAZidime  (FORTAZ ) 2 g in sodium chloride  0.9 % 100 mL IVPB        2 g 200 mL/hr over 30 Minutes Intravenous Every 8 hours 04/27/24 1518 05/03/24 2359   03/16/24 1015  cefTRIAXone   (ROCEPHIN ) 2 g in sodium chloride  0.9 % 100 mL IVPB        2 g 200 mL/hr over 30 Minutes Intravenous Every 24 hours 03/16/24 0929 03/20/24 1219   03/16/24 1015  doxycycline  (VIBRA -TABS) tablet 100 mg        100 mg Oral Every 12 hours 03/16/24 0929 03/20/24 2131   03/14/24 1000  metroNIDAZOLE  (FLAGYL ) tablet 500 mg        500 mg Oral Every 12 hours 03/14/24 0803 03/19/24 2219   03/13/24 1000  cefadroxil  (DURICEF) capsule 1,000 mg  Status:  Discontinued        1,000 mg Oral 2 times daily 03/12/24 1632 03/16/24 0929   03/07/24 2200  metroNIDAZOLE  (FLAGYL ) tablet 500 mg        500 mg Oral Every 12 hours 03/07/24 0840 03/11/24 2258   03/05/24 1930  vancomycin  (VANCOREADY) IVPB 1250 mg/250 mL  Status:  Discontinued        1,250 mg 166.7 mL/hr over 90 Minutes Intravenous Every 24 hours 03/04/24 2205 03/04/24 2224   03/05/24 1930  Vancomycin  (VANCOCIN ) 1,250 mg in sodium chloride  0.9 % 250 mL IVPB  Status:  Discontinued        1,250 mg 166.7 mL/hr over 90 Minutes Intravenous Every 24 hours 03/04/24 2224 03/05/24 0717   03/05/24 1930  vancomycin  (VANCOREADY) IVPB 1500 mg/300 mL  Status:  Discontinued        1,500 mg 150 mL/hr  over 120 Minutes Intravenous Every 24 hours 03/05/24 0717 03/12/24 1636   03/05/24 1400  ceFEPIme  (MAXIPIME ) 2 g in sodium chloride  0.9 % 100 mL IVPB  Status:  Discontinued        2 g 200 mL/hr over 30 Minutes Intravenous Every 8 hours 03/05/24 0717 03/12/24 1632   03/05/24 0600  ceFEPIme  (MAXIPIME ) 2 g in sodium chloride  0.9 % 100 mL IVPB  Status:  Discontinued        2 g 200 mL/hr over 30 Minutes Intravenous Every 12 hours 03/04/24 2205 03/05/24 0717   03/05/24 0000  metroNIDAZOLE  (FLAGYL ) IVPB 500 mg  Status:  Discontinued        500 mg 100 mL/hr over 60 Minutes Intravenous Every 12 hours 03/04/24 2206 03/04/24 2209   03/04/24 1730  aztreonam (AZACTAM) 2 g in sodium chloride  0.9 % 100 mL IVPB  Status:  Discontinued        2 g 200 mL/hr over 30 Minutes Intravenous  Once  03/04/24 1715 03/04/24 1724   03/04/24 1730  metroNIDAZOLE  (FLAGYL ) IVPB 500 mg  Status:  Discontinued        500 mg 100 mL/hr over 60 Minutes Intravenous Every 12 hours 03/04/24 1715 03/07/24 0840   03/04/24 1730  vancomycin  (VANCOREADY) IVPB 1500 mg/300 mL        1,500 mg 150 mL/hr over 120 Minutes Intravenous  Once 03/04/24 1715 03/04/24 2153   03/04/24 1730  ceFEPIme  (MAXIPIME ) 2 g in sodium chloride  0.9 % 100 mL IVPB        2 g 200 mL/hr over 30 Minutes Intravenous  Once 03/04/24 1724 03/04/24 1826       Subjective: Patient seen and examined at the bedside today.  Comfortable,on room air. No SOB or cough. I discussed about xary findings with his daughter,she is agreeable for trying US  thora  Objective: Vitals:   06/18/24 1943 06/18/24 2354 06/19/24 0343 06/19/24 0800  BP: 127/77 132/76 119/75 117/76  Pulse: 76 75 71 80  Resp: 18 20 20 18   Temp: (!) 97.5 F (36.4 C) (!) 97.4 F (36.3 C) (!) 97.5 F (36.4 C) 97.6 F (36.4 C)  TempSrc: Oral     SpO2: 100% 99% 100% 100%  Weight:      Height:        Intake/Output Summary (Last 24 hours) at 06/19/2024 1139 Last data filed at 06/19/2024 1000 Gross per 24 hour  Intake 495 ml  Output 1950 ml  Net -1455 ml   Filed Weights   06/11/24 0100 06/12/24 0601 06/13/24 0128  Weight: 63 kg 63.5 kg 63.5 kg    Examination:   General exam: Overall comfortable, not in distress,chronically deconditioned HEENT: PERRL Respiratory system:  no wheezes or crackles .diminised air sounds on bases Cardiovascular system: S1 & S2 heard, RRR.  Gastrointestinal system: Abdomen is nondistended, soft and nontender. Central nervous system: Alert and oriented,residual right sided weakness Extremities: No edema, no clubbing ,no cyanosis Skin: pressure ulcers   Data Reviewed: I have personally reviewed following labs and imaging studies  CBC: Recent Labs  Lab 06/15/24 0607  WBC 5.3  HGB 12.1*  HCT 36.4*  MCV 87.5  PLT 247   Basic  Metabolic Panel: Recent Labs  Lab 06/15/24 0607  NA 136  K 4.4  CL 108  CO2 20*  GLUCOSE 117*  BUN 25*  CREATININE 0.89  CALCIUM  8.3*     No results found for this or any previous visit (from the past 240  hours).    Radiology Studies: DG CHEST PORT 1 VIEW Result Date: 06/19/2024 CLINICAL DATA:  Shortness of breath EXAM: PORTABLE CHEST 1 VIEW COMPARISON:  06/06/2024 FINDINGS: Cardiac shadow is enlarged. Increasing right-sided pleural effusion is noted. Small left effusion is noted as well. No focal confluent infiltrate is seen. No bony abnormality is noted. IMPRESSION: Bilateral pleural effusions right greater than left. Electronically Signed   By: Oneil Devonshire M.D.   On: 06/19/2024 03:47    Scheduled Meds:  acetaminophen   650 mg Oral Q8H   amiodarone   200 mg Oral Daily   apixaban   5 mg Oral BID   atorvastatin   40 mg Oral Daily   calcium  carbonate  1 tablet Oral TID   collagenase    Topical Daily   diclofenac  Sodium  4 g Topical QID   feeding supplement  237 mL Oral TID BM   ferrous sulfate   325 mg Oral QHS   furosemide   20 mg Oral Daily   guaiFENesin   600 mg Oral BID   insulin  aspart  0-5 Units Subcutaneous QHS   insulin  aspart  0-9 Units Subcutaneous TID WC   insulin  glargine-yfgn  6 Units Subcutaneous Daily   levothyroxine   50 mcg Oral Q0600   lidocaine   1 patch Transdermal Q24H   liver oil-zinc  oxide   Topical BID   multivitamin with minerals  1 tablet Oral Daily   nutrition supplement (JUVEN)  1 packet Oral BID BM   mouth rinse  15 mL Mouth Rinse 4 times per day   polyethylene glycol  17 g Oral BID   potassium chloride   20 mEq Oral Daily   senna-docusate  1 tablet Oral QHS   sertraline   50 mg Oral Daily   sodium chloride  flush  10-40 mL Intracatheter Q12H   Tafamidis   1 capsule Oral Daily   vutrisiran  sodium  25 mg Subcutaneous Q90 days   Continuous Infusions:  dextrose        LOS: 107 days   Ivonne Mustache, MD [facility] P7/30/2025, 11:39 AM

## 2024-06-19 NOTE — Progress Notes (Signed)
 Occupational Therapy Treatment Patient Details Name: Earl [name] MRN: [id number] DOB: Feb 23, 1941 Today's Date: 06/19/2024   History of present illness 83 y.o. male admitted from [facility] SN[facility] rehab on 03/04/24 with SOB, sacral pain, Afib with RVR. Pt with acute metabolic encephalopathy, rhabdomyolysis and sepsis secondary to sacral wound infection. 4/15 & 4/17 bedside sacral wound debridement. Pt also with B pleural effusion, 6/18 L thoracentesis, 7/14 L & R thoracentesis.  PMhx: adm 3/26-4/08/2024 with fall, AMS, COVID; d/c to SNF rehab. CVA (2021, residual R-side weakness), T2DM, HTN, CHF, MGUS, Afib on Eliquis .   OT comments  Pt very lehargic and fatiugued this session, needing increased support to maintain static sitting balance mostly min A today compared to typically being CGA. He declined OOB attempts and had 2 Bowel movements during bed mobility. He was very limited this session, continues to demonstrate a need for Total A to setup A for his ADLs. Pt did note to need a thoracentesis and transport at room by end for procedure. Will make one follow-up visit with pt to determine his appropriateness for remaining on OT caseload given today's lack of aptitude and energy for therapy. Goals of care downgraded as pt has not progressed in some time, will discuss with daughter when able.       If plan is discharge home, recommend the following:  A lot of help with walking and/or transfers;A lot of help with bathing/dressing/bathroom;Assistance with cooking/housework;Assist for transportation;Help with stairs or ramp for entrance;Two people to help with walking and/or transfers;Assistance with feeding   Equipment Recommendations  Other (comment) (defer)    Recommendations for Other Services      Precautions / Restrictions Precautions Precautions: Fall Recall of Precautions/Restrictions: Impaired Precaution/Restrictions Comments: sacral wound; L hip wound with wound vac, bilat heel wounds;  L shoulder wound Restrictions Weight Bearing Restrictions Per Provider Order: No       Mobility Bed Mobility Overal bed mobility: Needs Assistance Bed Mobility: Rolling, Sit to Supine, Sidelying to Sit Rolling: Mod assist, +2 for physical assistance Sidelying to sit: Max assist     Sit to sidelying: Mod assist, Used rails General bed mobility comments: Pt assisting bed mobility with rolling L<>R with mod A using bed rails. More assist needed with BLEs and raising trunk. Pt had two BMs during bed mobility, was cleaned and repositioned appropriately.    Transfers                   General transfer comment: Pt declined.     Balance Overall balance assessment: Needs assistance Sitting-balance support: Single extremity supported, Feet supported Sitting balance-Leahy Scale: Poor Sitting balance - Comments: Intermittent periods of CGA, mostly min to mod A. RUE bed rails                                   ADL either performed or assessed with clinical judgement   ADL                       Lower Body Dressing: Total assistance;Bed level       Toileting- Clothing Manipulation and Hygiene: Maximal assistance;+2 for physical assistance;Bed level         General ADL Comments: Pt declined transfers, was open to getting to EOB for stretches + exercises.    Extremity/Trunk Assessment Upper Extremity Assessment Upper Extremity Assessment: Generalized weakness RUE Deficits / Details: impaired grip/grasp RUE Coordination:  decreased fine motor;decreased gross motor LUE Deficits / Details: able to grasp washcloth to wash face and hands            Vision       Perception     Praxis     Communication Communication Communication: Impaired Factors Affecting Communication: Reduced clarity of speech;Difficulty expressing self   Cognition Arousal: Alert Behavior During Therapy: WFL for tasks assessed/performed               OT - Cognition  Comments: Pt continously called out for RN when explained that he needs his sacral wound pad changed following BM. Explained to pt it would be best to call for her instead of shouting out her name.                 Following commands: Impaired Following commands impaired: Follows one step commands with increased time      Cueing   Cueing Techniques: Verbal cues, Tactile cues, Visual cues  Exercises General Exercises - Lower Extremity Long Arc Quad: AROM, Strengthening, Both, Supine, 10 reps Other Exercises Other Exercises: Pulling into anterior weight shift, resisting sustained pull by OT. Other Exercises: Prolonged knee ext stretch BLEs, 1-2 min intervals x4 reps each.    Shoulder Instructions       General Comments Pt repositioned as he desired, transport at bedside. Notified RN of need for new sacral pad.    Pertinent Vitals/ Pain       Pain Assessment Pain Assessment: Faces Faces Pain Scale: Hurts little more Pain Location: generalized Pain Descriptors / Indicators: Moaning, Discomfort Pain Intervention(s): Repositioned, Monitored during session, Limited activity within patient's tolerance  Home Living                                          Prior Functioning/Environment              Frequency  Min 1X/week        Progress Toward Goals  OT Goals(current goals can now be found in the care plan section)  Progress towards OT goals: Not progressing toward goals - comment;Goals drowngraded-see care plan  Acute Rehab OT Goals Patient Stated Goal: to be able to walk again OT Goal Formulation: With patient Time For Goal Achievement: 07/03/24 Potential to Achieve Goals: Fair  Plan      Co-evaluation                 AM-PAC OT 6 Clicks Daily Activity     Outcome Measure   Help from another person eating meals?: A Little Help from another person taking care of personal grooming?: A Little Help from another person toileting,  which includes using toliet, bedpan, or urinal?: Total Help from another person bathing (including washing, rinsing, drying)?: A Lot Help from another person to put on and taking off regular upper body clothing?: A Lot Help from another person to put on and taking off regular lower body clothing?: Total 6 Click Score: 12    End of Session    OT Visit Diagnosis: Other abnormalities of gait and mobility (R26.89);Muscle weakness (generalized) (M62.81);History of falling (Z91.81);Feeding difficulties (R63.3);Ataxia, unspecified (R27.0) Pain - Right/Left: Left Pain - part of body:  (sacral area)   Activity Tolerance Patient limited by lethargy;Patient limited by fatigue   Patient Left in bed;with call bell/phone within reach   Nurse Communication Mobility status;Need for  lift equipment        Time: 8593-8561 OT Time Calculation (min): 32 min  Charges: OT General Charges $OT Visit: 1 Visit OT Treatments $Therapeutic Activity: 23-37 mins  06/19/2024  AB, OTR/L  Acute Rehabilitation Services  Office: [phone]   [name] 06/19/2024, 4:02 PM

## 2024-06-20 DIAGNOSIS — A419 Sepsis, unspecified organism: Secondary | ICD-10-CM | POA: Diagnosis not present

## 2024-06-20 DIAGNOSIS — R6520 Severe sepsis without septic shock: Secondary | ICD-10-CM | POA: Diagnosis not present

## 2024-06-20 LAB — GLUCOSE, CAPILLARY
Glucose-Capillary: 89 mg/dL (ref 70–99)
Glucose-Capillary: 157 mg/dL — ABNORMAL HIGH (ref 70–99)
Glucose-Capillary: 138 mg/dL — ABNORMAL HIGH (ref 70–99)
Glucose-Capillary: 129 mg/dL — ABNORMAL HIGH (ref 70–99)

## 2024-06-20 NOTE — Plan of Care (Signed)
  Problem: Fluid Volume: Goal: Hemodynamic stability will improve Outcome: Progressing   Problem: Clinical Measurements: Goal: Diagnostic test results will improve Outcome: Progressing Goal: Signs and symptoms of infection will decrease Outcome: Progressing   Problem: Education: Goal: Knowledge of risk factors and measures for prevention of condition will improve Outcome: Progressing   Problem: Coping: Goal: Psychosocial and spiritual needs will be supported Outcome: Progressing   Problem: Respiratory: Goal: Will maintain a patent airway Outcome: Progressing Goal: Complications related to the disease process, condition or treatment will be avoided or minimized Outcome: Progressing   Problem: Education: Goal: Ability to describe self-care measures that may prevent or decrease complications (Diabetes Survival Skills Education) will improve Outcome: Progressing   Problem: Coping: Goal: Ability to adjust to condition or change in health will improve Outcome: Progressing   Problem: Fluid Volume: Goal: Ability to maintain a balanced intake and output will improve Outcome: Progressing   Problem: Health Behavior/Discharge Planning: Goal: Ability to manage health-related needs will improve Outcome: Progressing   Problem: Metabolic: Goal: Ability to maintain appropriate glucose levels will improve Outcome: Progressing   Problem: Nutritional: Goal: Maintenance of adequate nutrition will improve Outcome: Progressing Goal: Progress toward achieving an optimal weight will improve Outcome: Progressing   Problem: Skin Integrity: Goal: Risk for impaired skin integrity will decrease Outcome: Progressing   Problem: Tissue Perfusion: Goal: Adequacy of tissue perfusion will improve Outcome: Progressing   Problem: Education: Goal: Knowledge of General Education information will improve Description: Including pain rating scale, medication(s)/side effects and non-pharmacologic  comfort measures Outcome: Progressing   Problem: Health Behavior/Discharge Planning: Goal: Ability to manage health-related needs will improve Outcome: Progressing   Problem: Clinical Measurements: Goal: Ability to maintain clinical measurements within normal limits will improve Outcome: Progressing Goal: Will remain free from infection Outcome: Progressing Goal: Diagnostic test results will improve Outcome: Progressing Goal: Respiratory complications will improve Outcome: Progressing Goal: Cardiovascular complication will be avoided Outcome: Progressing   Problem: Activity: Goal: Risk for activity intolerance will decrease Outcome: Progressing   Problem: Nutrition: Goal: Adequate nutrition will be maintained Outcome: Progressing   Problem: Coping: Goal: Level of anxiety will decrease Outcome: Progressing   Problem: Elimination: Goal: Will not experience complications related to bowel motility Outcome: Progressing Goal: Will not experience complications related to urinary retention Outcome: Progressing   Problem: Pain Managment: Goal: General experience of comfort will improve and/or be controlled Outcome: Progressing   Problem: Safety: Goal: Ability to remain free from injury will improve Outcome: Progressing   Problem: Skin Integrity: Goal: Risk for impaired skin integrity will decrease Outcome: Progressing

## 2024-06-20 NOTE — Progress Notes (Signed)
 Physical Therapy Treatment Patient Details Name: Earl [name] MRN: [id number] DOB: 03-17-1941 Today's Date: 06/20/2024   History of Present Illness 83 y.o. male admitted from [facility] rehab on 03/04/24 with SOB, sacral pain, Afib with RVR. Pt with acute metabolic encephalopathy, rhabdomyolysis and sepsis secondary to sacral wound infection. 4/15 & 4/17 bedside sacral wound debridement. Pt also with B pleural effusion, 6/18 L thoracentesis, 7/14 L & R thoracentesis.  PMhx: adm 3/26-4/08/2024 with fall, AMS, COVID; d/c to SNF rehab. CVA (2021, residual R-side weakness), T2DM, HTN, CHF, MGUS, Afib on Eliquis .    PT Comments  The pt continues to display deficits in generalized strength, impacting his independence and balance with all functional mobility. He is currently requiring modA to roll in bed and maxA to transition supine <> sit EOB, along with up to minA with bil UE support to maintain static sitting balance at EOB. Pt was able to complete x5 sit <> stand reps from EOB using the stedy to pull up to stand, but needed modA to do so along with repeated cues to extend his trunk and legs and shift his weight anteriorly to improve his standing balance. Pt could progress to minA for static standing balance, but only could stand for < ~40 sec at a time before needing to sit and rest due to endurance deficits. Perfromed x5 sit <> stands along with supine UE theraband exercises to improve his strength. Will continue to follow acutely.     If plan is discharge home, recommend the following: Two people to help with walking and/or transfers;Two people to help with bathing/dressing/bathroom;Assistance with feeding;Assist for transportation   Can travel by private vehicle     No  Equipment Recommendations  Hospital bed;Hoyer lift;Wheelchair cushion (measurements PT);Wheelchair (measurements PT) (air mattress, roho cushion)    Recommendations for Other Services       Precautions / Restrictions  Precautions Precautions: Fall Recall of Precautions/Restrictions: Impaired Precaution/Restrictions Comments: sacral wound; L hip wound with wound vac, bilat heel wounds; L shoulder wound Restrictions Weight Bearing Restrictions Per Provider Order: No     Mobility  Bed Mobility Overal bed mobility: Needs Assistance Bed Mobility: Rolling, Supine to Sit, Sit to Supine Rolling: Mod assist, Used rails Sidelying to sit: Max assist, HOB elevated, Used rails   Sit to supine: Max assist, HOB elevated   General bed mobility comments: Pt initiated reaching to and grabbing bed rails to pull to roll, but needed modA at hips to complete rolling bil. Needed assist to bring L leg across R to roll R as well. Pt initiated bringing legs off L EOB when cued to transition supine to sit, but pt needed cues to grab onto therapist's arm to pull and ascend trunk to sit up. MaxA at trunk and hips using bed pad to sit up and scoot to EOB. Cues provided for pt to lean laterally to L elbow to descend trunk with return to supine, needing maxA at trunk and legs to return to supine. Bed in trendelenburg with pt pushing through legs to scoot superiorly 1x with CGA, then 1x with maxA with poor initiation by pt    Transfers Overall transfer level: Needs assistance Equipment used: Ambulation equipment used Transfers: Sit to/from Stand Sit to Stand: From elevated surface, Via lift equipment, Mod assist           General transfer comment: Verbal and tactile cues with visual demonstration provided to pt to extend legs and hips and shift weight anteriorly to stand from  EOB to stedy x5 reps. Pt tends to lean posteriorly with knees slightly flexed. Pt demonstrated fair carryover of cues. Pt required modA to power up to stand and gain balance each rep    Ambulation/Gait               General Gait Details: unable at this time   Stairs             Wheelchair Mobility     Tilt Bed    Modified Rankin  (Stroke Patients Only)       Balance Overall balance assessment: Needs assistance Sitting-balance support: Single extremity supported, Bilateral upper extremity supported, Feet supported Sitting balance-Leahy Scale: Poor Sitting balance - Comments: Posterior lean noted, pt relying on bil hands on bed rails to maintain his static sitting balance, progressing from minA to CGA once pt had good grip on bed rails. Postural control: Posterior lean Standing balance support: Bilateral upper extremity supported Standing balance-Leahy Scale: Poor Standing balance comment: Reliant on UE support on stedy and min-modA for balance                            Communication Communication Communication: Impaired Factors Affecting Communication: Reduced clarity of speech;Difficulty expressing self (soft spoken)  Cognition Arousal: Alert Behavior During Therapy: WFL for tasks assessed/performed   PT - Cognitive impairments: Problem solving Difficult to assess due to: Impaired communication                     PT - Cognition Comments: needs cues to problem-solve bed mobility, increased time to complete all tasks Following commands: Impaired Following commands impaired: Follows one step commands with increased time    Cueing Cueing Techniques: Verbal cues, Tactile cues, Visual cues  Exercises Other Exercises Other Exercises: bil UE bench press against yellow therabands while supine, x10 reps Other Exercises: supine bil UE shoulder internal rotation and horizontal adduction AROM against yellow therabands, x3 reps    General Comments        Pertinent Vitals/Pain Pain Assessment Pain Assessment: Faces Faces Pain Scale: Hurts little more Pain Location: sacrum Pain Descriptors / Indicators: Discomfort, Grimacing Pain Intervention(s): Limited activity within patient's tolerance, Monitored during session, Repositioned    Home Living                          Prior  Function            PT Goals (current goals can now be found in the care plan section) Acute Rehab PT Goals Patient Stated Goal: move better, go home PT Goal Formulation: With patient Time For Goal Achievement: 06/28/24 Potential to Achieve Goals: Fair Progress towards PT goals: Progressing toward goals    Frequency    Min 1X/week      PT Plan      Co-evaluation              AM-PAC PT 6 Clicks Mobility   Outcome Measure  Help needed turning from your back to your side while in a flat bed without using bedrails?: A Lot Help needed moving from lying on your back to sitting on the side of a flat bed without using bedrails?: A Lot Help needed moving to and from a bed to a chair (including a wheelchair)?: Total Help needed standing up from a chair using your arms (e.g., wheelchair or bedside chair)?: Total Help needed to walk in hospital  room?: Total Help needed climbing 3-5 steps with a railing? : Total 6 Click Score: 8    End of Session Equipment Utilized During Treatment: Gait belt Activity Tolerance: Patient tolerated treatment well Patient left: with call bell/phone within reach;in bed   PT Visit Diagnosis: Muscle weakness (generalized) (M62.81);Adult, failure to thrive (R62.7);Other abnormalities of gait and mobility (R26.89);Unsteadiness on feet (R26.81);Difficulty in walking, not elsewhere classified (R26.2) Pain - Right/Left: Left Pain - part of body: Knee     Time: 1139-1205 PT Time Calculation (min) (ACUTE ONLY): 26 min  Charges:    $Therapeutic Exercise: 8-22 mins $Therapeutic Activity: 8-22 mins PT General Charges $$ ACUTE PT VISIT: 1 Visit                     Theo Ferretti, PT, DPT Acute Rehabilitation Services  Office: [phone]    Theo CHRISTELLA Ferretti 06/20/2024, 12:40 PM

## 2024-06-20 NOTE — Progress Notes (Signed)
 PROGRESS NOTE  Edith Pettinato  FMW:[id number] DOB: [birth year] DOA: 03/04/2024 PCP: [name], MD   Brief Narrative: Patient is a 83 year old male with history of paroxysmal A-fib, insulin -dependent diabetes, hypertension, hyperlipidemia, CVA with residual right-sided weakness, MGUS, GERD, BPH, hypothyroidism, HFrEF with EF of 40 to 45%, chronic pancytopenia who presented to the emergency department from SNF with complaint of shortness of breath, wound, pain in the sacral area.  Hospital course was remarkable for acute metabolic encephalopathy, hypotension, hypoglycemia.  Found to have worsening sacral ulcers.  General surgery consulted.Status post bedside excisional debridement of the sacral wound on 4/15 and 4/17.  Prolonged hospitalization due to reluctance of family to take him home because of pressure ulcers.They are anticipating the ulcers to heal before they can take him home.TOC assisting with disposition  Assessment & Plan:  Principal Problem:   Severe sepsis (HCC) Active Problems:   Paroxysmal atrial fibrillation (HCC)   Sepsis (HCC)   Atrial flutter (HCC)   Sacral ulcer (HCC)   MGUS (monoclonal gammopathy of unknown significance)   Essential hypertension   Hyperlipidemia   Insulin  dependent type 2 diabetes mellitus (HCC)   History of CVA (cerebrovascular accident)   Cardiac amyloidosis (HCC)   Chronic systolic CHF (congestive heart failure) (HCC)   GERD (gastroesophageal reflux disease)   History of hypertension   Atrial fibrillation with RVR (HCC)   Community acquired pneumonia   Protein-calorie malnutrition, severe   Wild-type transthyretin-related (ATTR) amyloidosis (HCC)   Sacral decubitus ulcer, stage IV (HCC)  Sepsis secondary to sacral pressure wound/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  Hypotensive, encephalopathic, hypoglycemic, elevated  lactate, leukocytosis on presentation. Given IV fluid, broad spectrum antibiotics.  Cultures:NGTD.  Completed antibiotics course.  Currently hemodynamically stable. 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  General surgery consulted,   PT consulted for hydrotherapy. Started  zinc , vitamin C .Status post bedside excisional debridement of the sacral wound on 4/15 and 4/17.  Also seen by ID.  Completed antibiotics course. CT on 6/16 concern for possible osteomyelitis. Surgery consulted and performed wound debridement during this hospitalization. Plastic surgery was also consulted with recommendation for continued wound care and no surgical management.Continue current wound care, continue pressure relieving therapies. Wound care nurse following. 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 The ulcer itself is less concave in appearance compared to prior. No new focal drainable fluid collection.  Also has left trochanteric ulceration with yellow sloughing. Debulked by ortho at the bedside on 7/14, wound care on board. Skin protected to the hip with VAC drape for foam bridge .  Wound care nurse closely following   Paroxysmal A-fib: Presented with A-fib with RVR.   Cardiology was following.   Digoxin  discontinued.  Amiodarone  changed to oral.  Eliquis   restarted.  Currently rate is well-controlled:mostly in NSR   History of HFrEF/transthyretin cardiac amyloidosis/nonischemic cardiomyopathy/hypotension:Continue Tafamidis  and vutrisiran .Continue with  low dose lasix   Recurrent bilateral pleural effusion: Underwent left-sided  thoracentesis on 6/18.  Body fluid culture without any growth.  Also underwent bilateral thoracentesis on 7/14 by pulmonology .Pleural fluid was transudative in nature  CXR on 7/30 again showed bilateral pleural effusions,more  on right.Underwent IR guided   thoracentesis on right with removal of 500 mL of pleural fluid.  Remains on room air  Insulin -dependent diabetes type 2: Presented with hyperglycemia.  Recent A1c in the range of 8.  Diabetic coordinator was consulted and following.  Currently on sliding scale, Lantus    History of CVA: Has a right-sided residual weakness.  On Eliquis , Lipitor.  Complains of intermittent dizziness.  Already on meclizine    GERD: Continue Protonix   Anemia of chronic disease: Continue iron supplementation   Hypothyroidism: Continue levothyroxine   Constipation: Continue bowel regimen    Severe protein calorie malnutrition/suspicion for dysphagia/hypophosphatemia:   Dietitian was consulted.  Goals of care: Very deconditioned elderly male with bedbound status, multiple comorbidities, sacral pressure ulcer.  CODE STATUS DNR.  Palliative care consulted for goals of care.   PT/OT consulted.  Recommended SNF on discharge.  TOC following for placement      Nutrition Problem: Severe Malnutrition Etiology: chronic illness Pressure Injury 02/26/24 Coccyx Mid Stage 4 - Full thickness tissue loss with exposed bone, tendon or muscle. (Active)  02/26/24 0916  Location: Coccyx  Location Orientation: Mid  Staging: Stage 4 - Full thickness tissue loss with exposed bone, tendon or muscle.  Wound Description (Comments):   DO NOT USE:  Present on Admission: Yes  Dressing Type Foam - Lift dressing to assess site every shift;Silver dressings 06/20/24 0457     Pressure Injury 02/26/24 Toe (Comment  which one) Anterior;Right Deep Tissue Pressure Injury - Purple or maroon localized area of discolored intact skin or blood-filled blister due to damage of underlying soft tissue  from pressure and/or shear. right gre (Active)  02/26/24 1215  Location: Toe (Comment  which one)  Location Orientation: Anterior;Right  Staging: Deep Tissue Pressure Injury - Purple or maroon localized area of discolored intact skin or blood-filled blister due to damage of underlying soft tissue from pressure and/or shear.  Wound Description (Comments): right great toe  DO NOT USE:  Present on Admission:   Dressing Type None 06/20/24 0442     Pressure Injury 03/06/24 Hip Anterior;Left;Proximal Unstageable - Full thickness tissue loss in which the base of the injury is covered by slough (yellow, tan, gray, green or brown) and/or eschar (tan, brown or black) in the wound bed. unstageable (Active)  03/06/24 1612  Location: Hip  Location Orientation: Anterior;Left;Proximal  Staging: Unstageable - Full thickness tissue loss in which the base of the injury is covered by slough (yellow, tan, gray, green or brown) and/or eschar (tan, brown or black) in the wound bed.  Wound Description (Comments): unstageable  DO NOT USE:  Present on Admission: No  Dressing Type Foam - Lift dressing to assess site every shift 06/20/24 0446     Pressure Injury 03/06/24 Shoulder Left Unstageable - Full thickness tissue loss in which the base of the injury is covered by slough (yellow, tan, gray, green or brown) and/or eschar (tan, brown or black) in the wound bed. unstageable (Active)  03/06/24 1613  Location: Shoulder  Location Orientation: Left  Staging: Unstageable - Full thickness tissue loss in which the base of the injury is covered by slough (yellow, tan, gray, green or brown) and/or eschar (tan, brown or black) in the wound bed.  Wound Description (Comments): unstageable  DO NOT USE:  Present on Admission: No  Dressing Type None 06/20/24 0449     Pressure Injury 04/23/24 Heel Right Unstageable - Full thickness tissue loss in which the base of the injury is covered  by slough (yellow, tan, gray, green or  brown) and/or eschar (tan, brown or black) in the wound bed. DTPI evolving into unstageable (Active)  04/23/24   Location: Heel  Location Orientation: Right  Staging: Unstageable - Full thickness tissue loss in which the base of the injury is covered by slough (yellow, tan, gray, green or brown) and/or eschar (tan, brown or black) in the wound bed.  Wound Description (Comments): DTPI evolving into unstageable  DO NOT USE:  Present on Admission: No  Dressing Type None 06/20/24 0436     Pressure Injury 04/23/24 Heel Left Deep Tissue Pressure Injury - Purple or maroon localized area of discolored intact skin or blood-filled blister due to damage of underlying soft tissue from pressure and/or shear. (Active)  04/23/24   Location: Heel  Location Orientation: Left  Staging: Deep Tissue Pressure Injury - Purple or maroon localized area of discolored intact skin or blood-filled blister due to damage of underlying soft tissue from pressure and/or shear.  Wound Description (Comments):   DO NOT USE:  Present on Admission: No  Dressing Type Foam - Lift dressing to assess site every shift;Other (Comment) 06/20/24 0438    DVT prophylaxis:SCDs Start: 03/04/24 2151 Place TED hose Start: 03/04/24 2151 apixaban  (ELIQUIS ) tablet 5 mg     Code Status: Limited: Do not attempt resuscitation (DNR) -DNR-LIMITED -Do Not Intubate/DNI   Family Communication: Called and discussed with daughter Para on phone on 7/31  Patient status:Inpatient  Patient is from :home  Anticipated discharge to:not sure  Estimated DC date:not sure   Consultants: Cardiology, general surgery, PCCM  Procedures: Wound excision and debridement, thoracentesis  Antimicrobials:  Anti-infectives (From admission, onward)    Start     Dose/Rate Route Frequency Ordered Stop   04/27/24 1615  cefTAZidime  (FORTAZ ) 2 g in sodium chloride  0.9 % 100 mL IVPB        2 g 200 mL/hr over 30 Minutes Intravenous Every 8 hours 04/27/24 1518  05/03/24 2359   03/16/24 1015  cefTRIAXone  (ROCEPHIN ) 2 g in sodium chloride  0.9 % 100 mL IVPB        2 g 200 mL/hr over 30 Minutes Intravenous Every 24 hours 03/16/24 0929 03/20/24 1219   03/16/24 1015  doxycycline  (VIBRA -TABS) tablet 100 mg        100 mg Oral Every 12 hours 03/16/24 0929 03/20/24 2131   03/14/24 1000  metroNIDAZOLE  (FLAGYL ) tablet 500 mg        500 mg Oral Every 12 hours 03/14/24 0803 03/19/24 2219   03/13/24 1000  cefadroxil  (DURICEF) capsule 1,000 mg  Status:  Discontinued        1,000 mg Oral 2 times daily 03/12/24 1632 03/16/24 0929   03/07/24 2200  metroNIDAZOLE  (FLAGYL ) tablet 500 mg        500 mg Oral Every 12 hours 03/07/24 0840 03/11/24 2258   03/05/24 1930  vancomycin  (VANCOREADY) IVPB 1250 mg/250 mL  Status:  Discontinued        1,250 mg 166.7 mL/hr over 90 Minutes Intravenous Every 24 hours 03/04/24 2205 03/04/24 2224   03/05/24 1930  Vancomycin  (VANCOCIN ) 1,250 mg in sodium chloride  0.9 % 250 mL IVPB  Status:  Discontinued        1,250 mg 166.7 mL/hr over 90 Minutes Intravenous Every 24 hours 03/04/24 2224 03/05/24 0717   03/05/24 1930  vancomycin  (VANCOREADY) IVPB 1500 mg/300 mL  Status:  Discontinued        1,500 mg 150 mL/hr over 120 Minutes  Intravenous Every 24 hours 03/05/24 0717 03/12/24 1636   03/05/24 1400  ceFEPIme  (MAXIPIME ) 2 g in sodium chloride  0.9 % 100 mL IVPB  Status:  Discontinued        2 g 200 mL/hr over 30 Minutes Intravenous Every 8 hours 03/05/24 0717 03/12/24 1632   03/05/24 0600  ceFEPIme  (MAXIPIME ) 2 g in sodium chloride  0.9 % 100 mL IVPB  Status:  Discontinued        2 g 200 mL/hr over 30 Minutes Intravenous Every 12 hours 03/04/24 2205 03/05/24 0717   03/05/24 0000  metroNIDAZOLE  (FLAGYL ) IVPB 500 mg  Status:  Discontinued        500 mg 100 mL/hr over 60 Minutes Intravenous Every 12 hours 03/04/24 2206 03/04/24 2209   03/04/24 1730  aztreonam (AZACTAM) 2 g in sodium chloride  0.9 % 100 mL IVPB  Status:  Discontinued        2  g 200 mL/hr over 30 Minutes Intravenous  Once 03/04/24 1715 03/04/24 1724   03/04/24 1730  metroNIDAZOLE  (FLAGYL ) IVPB 500 mg  Status:  Discontinued        500 mg 100 mL/hr over 60 Minutes Intravenous Every 12 hours 03/04/24 1715 03/07/24 0840   03/04/24 1730  vancomycin  (VANCOREADY) IVPB 1500 mg/300 mL        1,500 mg 150 mL/hr over 120 Minutes Intravenous  Once 03/04/24 1715 03/04/24 2153   03/04/24 1730  ceFEPIme  (MAXIPIME ) 2 g in sodium chloride  0.9 % 100 mL IVPB        2 g 200 mL/hr over 30 Minutes Intravenous  Once 03/04/24 1724 03/04/24 1826       Subjective: Patient seen and examined at bedside today.  Overall comfortable, on room air.  Does not complain of shortness of breath or cough.  Chronically deconditioned, weak, mostly bedbound  Objective: Vitals:   06/19/24 1900 06/20/24 0036 06/20/24 0601 06/20/24 0739  BP: 116/66 103/77 107/65 106/70  Pulse: 72 82 78 81  Resp:    20  Temp: 97.8 F (36.6 C) 97.8 F (36.6 C) 99 F (37.2 C) (!) 97.5 F (36.4 C)  TempSrc: Oral Oral Oral   SpO2: 100% 100% 99% 99%  Weight:      Height:        Intake/Output Summary (Last 24 hours) at 06/20/2024 1115 Last data filed at 06/20/2024 0037 Gross per 24 hour  Intake 200 ml  Output 700 ml  Net -500 ml   Filed Weights   06/11/24 0100 06/12/24 0601 06/13/24 0128  Weight: 63 kg 63.5 kg 63.5 kg    Examination:   General exam: Overall comfortable, not in distress, chronically deconditioned, chronically ill looking HEENT: PERRL Respiratory system:  no wheezes or crackles .  Diminished sounds in bases Cardiovascular system: S1 & S2 heard, RRR.  Gastrointestinal system: Abdomen is nondistended, soft and nontender. Central nervous system: Alert and oriented, residual right-sided weakness Extremities: No edema, no clubbing ,no cyanosis Skin: pressure ulcers     Data Reviewed: I have personally reviewed following labs and imaging studies  CBC: Recent Labs  Lab 06/15/24 0607   WBC 5.3  HGB 12.1*  HCT 36.4*  MCV 87.5  PLT 247   Basic Metabolic Panel: Recent Labs  Lab 06/15/24 0607  NA 136  K 4.4  CL 108  CO2 20*  GLUCOSE 117*  BUN 25*  CREATININE 0.89  CALCIUM  8.3*     No results found for this or any previous visit (from the past  240 hours).    Radiology Studies: IR THORACENTESIS ASP PLEURAL SPACE W/IMG GUIDE Result Date: 06/19/2024 INDICATION: 83 year old male. History of CHF. Anderson shortness of breath. Found to bilateral pleural effusions. Right greater than left. Request is for therapeutic right-sided thoracentesis EXAM: ULTRASOUND GUIDED THERAPEUTIC RIGHT-SIDED THORACENTESIS MEDICATIONS: Lidocaine  1% 10 mL COMPLICATIONS: None immediate. PROCEDURE: An ultrasound guided thoracentesis was thoroughly discussed with the patient and questions answered. The benefits, risks, alternatives and complications were also discussed. The patient understands and wishes to proceed with the procedure. Written consent was obtained. Ultrasound was performed to localize and mark an adequate pocket of fluid in the right chest. The area was then prepped and draped in the normal sterile fashion. 1% Lidocaine  was used for local anesthesia. Under ultrasound guidance a 6 Fr Safe-T-Centesis catheter was introduced. Thoracentesis was performed. The catheter was removed and a dressing applied. FINDINGS: A total of approximately 500 mL of amber color fluid was removed. IMPRESSION: Successful ultrasound guided right-sided therapeutic thoracentesis yielding 500 mL of pleural fluid. Performed by Delon Beagle NP Electronically Signed   By: Ester Sides M.D.   On: 06/19/2024 15:48   DG Chest 1 View Result Date: 06/19/2024 CLINICAL DATA:  Post thoracentesis EXAM: CHEST  1 VIEW COMPARISON:  06/19/2024 FINDINGS: Bilateral pleural effusions. Right pleural effusion is decreased since previous study likely related interval thoracentesis. Atelectasis in the lung bases, greater on the  left. No pneumothorax. Cardiac enlargement. No vascular congestion or edema. Mediastinal contours appear intact. Degenerative changes in the spine and shoulders. IMPRESSION: 1. Decreased right pleural effusion and basilar atelectasis post thoracentesis. No pneumothorax. 2. Residual left pleural effusion and basilar atelectasis unchanged. 3. Cardiac enlargement. Electronically Signed   By: Elsie Gravely M.D.   On: 06/19/2024 15:40   DG CHEST PORT 1 VIEW Result Date: 06/19/2024 CLINICAL DATA:  Shortness of breath EXAM: PORTABLE CHEST 1 VIEW COMPARISON:  06/06/2024 FINDINGS: Cardiac shadow is enlarged. Increasing right-sided pleural effusion is noted. Small left effusion is noted as well. No focal confluent infiltrate is seen. No bony abnormality is noted. IMPRESSION: Bilateral pleural effusions right greater than left. Electronically Signed   By: Oneil Devonshire M.D.   On: 06/19/2024 03:47    Scheduled Meds:  acetaminophen   650 mg Oral Q8H   amiodarone   200 mg Oral Daily   apixaban   5 mg Oral BID   atorvastatin   40 mg Oral Daily   calcium  carbonate  1 tablet Oral TID   collagenase    Topical Daily   diclofenac  Sodium  4 g Topical QID   feeding supplement  237 mL Oral TID BM   ferrous sulfate   325 mg Oral QHS   furosemide   20 mg Oral Daily   guaiFENesin   600 mg Oral BID   insulin  aspart  0-5 Units Subcutaneous QHS   insulin  aspart  0-9 Units Subcutaneous TID WC   insulin  glargine-yfgn  6 Units Subcutaneous Daily   levothyroxine   50 mcg Oral Q0600   lidocaine   1 patch Transdermal Q24H   liver oil-zinc  oxide   Topical BID   multivitamin with minerals  1 tablet Oral Daily   nutrition supplement (JUVEN)  1 packet Oral BID BM   mouth rinse  15 mL Mouth Rinse 4 times per day   polyethylene glycol  17 g Oral BID   potassium chloride   20 mEq Oral Daily   senna-docusate  1 tablet Oral QHS   sertraline   50 mg Oral Daily   sodium chloride  flush  10-40  mL Intracatheter Q12H   Tafamidis   1 capsule  Oral Daily   vutrisiran  sodium  25 mg Subcutaneous Q90 days   Continuous Infusions:  dextrose        LOS: 108 days   Ivonne Mustache, MD [facility] P7/31/2025, 11:15 AM

## 2024-06-20 NOTE — Plan of Care (Signed)
   Medical records reviewed including progress notes, labs and imaging.  He continues to have a difficult time with wound healing. Most recent albumin  of 2.2 on 7/20. Underwent right thoracentesis 7/30. Day 107 of hospitalization.  Received message from patient's daughter today requesting a return call. I called her back and offered to visit at bedside for support, however it is patient's wedding anniversary today and she feels it may be too difficult to discuss some of the topics she has questions about. She will be in [facility] tomorrow but will call me in the afternoon.   PMT will continue to follow peripherally and remains available for additional palliative needs.  Thank you for your referral and allowing PMT to assist in Earl [name]'s care.   Earl Vanschaick, PA-C Palliative Medicine Team  Team Phone # [phone]   NO CHARGE

## 2024-06-20 NOTE — TOC Progression Note (Signed)
 Transition of Care [facility]) - Progression Note    Patient Details  Name: Earl [name] MRN: [id number] Date of Birth: 1941-02-14  [facility] CM/SW Contact  Rosaline JONELLE Joe, RN Phone Number: 06/20/2024, 2:23 PM  Clinical Narrative:    CM spoke with the patient's daughter and the daughter states that she emailed [facility] but has not received answers to her email questions regarding the facility.  I spoke with DJ Mclaughlin and he plans to replay to her email questions and will attempt to call and answer her questions if agreeable.  I suggested to the daughter Para to tour the [facility] facility as well.  Insurance authorization has not been started at this time since the daughter is waiting to have email questions answered regarding the facility and visitation at the facility.   Expected Discharge Plan: Skilled Nursing Facility Barriers to Discharge: Continued Medical Work up               Expected Discharge Plan and Services In-house Referral: Clinical Social Work     Living arrangements for the past 2 months:  (from [facility] short term rehab)                                       Social Drivers of Health (SDOH) Interventions SDOH Screenings   Food Insecurity: No Food Insecurity (03/05/2024)  Housing: Low Risk  (03/05/2024)  Transportation Needs: No Transportation Needs (03/05/2024)  Utilities: Not At Risk (03/05/2024)  Depression (PHQ2-9): Low Risk  (10/03/2023)  Financial Resource Strain: Patient Declined (01/16/2024)  Physical Activity: Unknown (01/16/2024)  Social Connections: Moderately Integrated (03/05/2024)  Stress: Stress Concern Present (01/16/2024)  Tobacco Use: Low Risk  (05/12/2024)    Readmission Risk Interventions    03/19/2024   10:24 AM  Readmission Risk Prevention Plan  Transportation Screening Complete  Medication Review (RN Care Manager) Complete  PCP or Specialist appointment within 3-5 days of discharge Complete   HRI or Home Care Consult Complete  SW Recovery Care/Counseling Consult Complete  Palliative Care Screening Complete  Skilled Nursing Facility Complete

## 2024-06-21 ENCOUNTER — Other Ambulatory Visit (HOSPITAL_COMMUNITY): Payer: Self-pay

## 2024-06-21 ENCOUNTER — Inpatient Hospital Stay (HOSPITAL_COMMUNITY)

## 2024-06-21 DIAGNOSIS — A419 Sepsis, unspecified organism: Secondary | ICD-10-CM | POA: Diagnosis not present

## 2024-06-21 DIAGNOSIS — E43 Unspecified severe protein-calorie malnutrition: Secondary | ICD-10-CM | POA: Diagnosis not present

## 2024-06-21 DIAGNOSIS — Z515 Encounter for palliative care: Secondary | ICD-10-CM | POA: Diagnosis not present

## 2024-06-21 DIAGNOSIS — R6520 Severe sepsis without septic shock: Secondary | ICD-10-CM | POA: Diagnosis not present

## 2024-06-21 DIAGNOSIS — L89154 Pressure ulcer of sacral region, stage 4: Secondary | ICD-10-CM | POA: Diagnosis not present

## 2024-06-21 DIAGNOSIS — E854 Organ-limited amyloidosis: Secondary | ICD-10-CM | POA: Diagnosis not present

## 2024-06-21 LAB — BASIC METABOLIC PANEL WITH GFR
Anion gap: 9 (ref 5–15)
BUN: 29 mg/dL — ABNORMAL HIGH (ref 8–23)
CO2: 22 mmol/L (ref 22–32)
Calcium: 8.8 mg/dL — ABNORMAL LOW (ref 8.9–10.3)
Chloride: 108 mmol/L (ref 98–111)
Creatinine, Ser: 1 mg/dL (ref 0.61–1.24)
GFR, Estimated: >60 mL/min (ref >60)
Glucose, Bld: 110 mg/dL — ABNORMAL HIGH (ref 70–99)
Potassium: 4.9 mmol/L (ref 3.5–5.1)
Sodium: 139 mmol/L (ref 135–145)

## 2024-06-21 LAB — CBC
HCT: 36.9 % — ABNORMAL LOW (ref 39.0–52.0)
Hemoglobin: 12.5 g/dL — ABNORMAL LOW (ref 13.0–17.0)
MCH: 29 pg (ref 26.0–34.0)
MCHC: 33.9 g/dL (ref 30.0–36.0)
MCV: 85.6 fL (ref 80.0–100.0)
Platelets: 306 K/uL (ref 150–400)
RBC: 4.31 MIL/uL (ref 4.22–5.81)
RDW: 18.4 % — ABNORMAL HIGH (ref 11.5–15.5)
WBC: 5.4 K/uL (ref 4.0–10.5)
nRBC: 0 % (ref 0.0–0.2)

## 2024-06-21 LAB — GLUCOSE, CAPILLARY
Glucose-Capillary: 88 mg/dL (ref 70–99)
Glucose-Capillary: 155 mg/dL — ABNORMAL HIGH (ref 70–99)
Glucose-Capillary: 177 mg/dL — ABNORMAL HIGH (ref 70–99)
Glucose-Capillary: 190 mg/dL — ABNORMAL HIGH (ref 70–99)

## 2024-06-21 MED ORDER — PROSOURCE PLUS PO LIQD
30.0000 mL | Freq: Two times a day (BID) | ORAL | Status: DC
  Administered 2024-06-22 – 2024-08-21 (×126): 30 mL via ORAL
  Filled 2024-06-21 (×112): qty 30

## 2024-06-21 MED ORDER — ENSURE ENLIVE PO LIQD
237.0000 mL | Freq: Two times a day (BID) | ORAL | Status: DC
  Administered 2024-06-22 – 2024-08-21 (×118): 237 mL via ORAL
  Filled 2024-06-21 (×36): qty 237

## 2024-06-21 MED ORDER — JUVEN PO PACK
1.0000 | PACK | Freq: Two times a day (BID) | ORAL | Status: DC
  Administered 2024-06-22 – 2024-08-03 (×89): 1 via ORAL
  Filled 2024-06-21 (×82): qty 1

## 2024-06-21 NOTE — Progress Notes (Signed)
 PROGRESS NOTE  Earl [name]  FMW:[id number] DOB: July 04, 1941 DOA: 03/04/2024 PCP: [name], MD   Brief Narrative: Patient is a 83 year old male with history of paroxysmal A-fib, insulin -dependent diabetes, hypertension, hyperlipidemia, CVA with residual right-sided weakness, MGUS, GERD, BPH, hypothyroidism, HFrEF with EF of 40 to 45%, chronic pancytopenia who presented to the emergency department from SNF with complaint of shortness of breath, wound, pain in the sacral area.  Hospital course was remarkable for acute metabolic encephalopathy, hypotension, hypoglycemia.  Found to have worsening sacral ulcers.  General surgery consulted.Status post bedside excisional debridement of the sacral wound on 4/15 and 4/17.  Prolonged hospitalization due to reluctance of family to take him home because of pressure ulcers.They are anticipating the ulcers to heal before they can take him home.TOC assisting with disposition  06/21/2024: Patient seen.  No new changes.  Assessment & Plan:  Principal Problem:   Severe sepsis (HCC) Active Problems:   Paroxysmal atrial fibrillation (HCC)   MGUS (monoclonal gammopathy of unknown significance)   Essential hypertension   Hyperlipidemia   Insulin  dependent type 2 diabetes mellitus (HCC)   History of CVA (cerebrovascular accident)   Cardiac amyloidosis (HCC)   Chronic systolic CHF (congestive heart failure) (HCC)   Sepsis (HCC)   Atrial flutter (HCC)   GERD (gastroesophageal reflux disease)   Sacral ulcer (HCC)   History of hypertension   Atrial fibrillation with RVR (HCC)   Community acquired pneumonia   Protein-calorie malnutrition, severe   Wild-type transthyretin-related (ATTR) amyloidosis (HCC)   Sacral decubitus ulcer, stage IV (HCC)  Sepsis secondary to sacral pressure wound/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  Hypotensive,  encephalopathic, hypoglycemic, elevated lactate, leukocytosis on presentation. Given IV fluid, broad spectrum antibiotics.  Cultures:NGTD.  Completed antibiotics course.  Currently hemodynamically stable. 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  General surgery consulted,   PT consulted for hydrotherapy. Started  zinc , vitamin C .Status post bedside excisional debridement of the sacral wound on 4/15 and 4/17.  Also seen by ID.  Completed antibiotics course. CT on 6/16 concern for possible osteomyelitis. Surgery consulted and performed wound debridement during this hospitalization. Plastic surgery was also consulted with recommendation for continued wound care and no surgical management.Continue current wound care, continue pressure relieving therapies. Wound care nurse following. 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 The ulcer itself is less concave in appearance compared to prior. No new focal drainable fluid collection.  Also has left trochanteric ulceration with yellow sloughing. Debulked by ortho at the bedside on 7/14, wound care on board. Skin protected to the hip with VAC drape for foam bridge .  Wound care nurse closely following   Paroxysmal A-fib: Presented with A-fib with RVR.   Cardiology was following.   Digoxin  discontinued.  Amiodarone  changed to oral.  Eliquis   restarted.  Currently rate is well-controlled:mostly in NSR   History of HFrEF/transthyretin cardiac amyloidosis/nonischemic cardiomyopathy/hypotension:Continue Tafamidis  and vutrisiran .Continue with  low dose lasix   Recurrent bilateral pleural  effusion: Underwent left-sided thoracentesis on 6/18.  Body fluid culture without any growth.  Also underwent bilateral thoracentesis on 7/14 by pulmonology .Pleural fluid was transudative in nature  CXR on 7/30 again showed bilateral pleural effusions,more on right.Underwent IR guided   thoracentesis on right with removal of 500 mL of pleural fluid.  Remains on room air  Insulin -dependent diabetes type 2: Presented with hyperglycemia.  Recent A1c in the range of 8.  Diabetic coordinator was consulted and following.  Currently on sliding scale, Lantus    History of CVA: Has a right-sided residual weakness.  On Eliquis , Lipitor.  Complains of intermittent dizziness.  Already on meclizine    GERD: Continue Protonix   Anemia of chronic disease: Continue iron supplementation   Hypothyroidism: Continue levothyroxine   Constipation: Continue bowel regimen    Severe protein calorie malnutrition/suspicion for dysphagia/hypophosphatemia:   Dietitian was consulted.  Goals of care: Very deconditioned elderly male with bedbound status, multiple comorbidities, sacral pressure ulcer.  CODE STATUS DNR.  Palliative care consulted for goals of care.   PT/OT consulted.  Recommended SNF on discharge.  TOC following for placement      Nutrition Problem: Severe Malnutrition Etiology: chronic illness Pressure Injury 02/26/24 Coccyx Mid Stage 4 - Full thickness tissue loss with exposed bone, tendon or muscle. (Active)  02/26/24 0916  Location: Coccyx  Location Orientation: Mid  Staging: Stage 4 - Full thickness tissue loss with exposed bone, tendon or muscle.  Wound Description (Comments):   DO NOT USE:  Present on Admission: Yes  Dressing Type Foam - Lift dressing to assess site every shift 06/21/24 0801     Pressure Injury 02/26/24 Toe (Comment  which one) Anterior;Right Deep Tissue Pressure Injury - Purple or maroon localized area of discolored intact skin or blood-filled blister due to damage of  underlying soft tissue from pressure and/or shear. right gre (Active)  02/26/24 1215  Location: Toe (Comment  which one)  Location Orientation: Anterior;Right  Staging: Deep Tissue Pressure Injury - Purple or maroon localized area of discolored intact skin or blood-filled blister due to damage of underlying soft tissue from pressure and/or shear.  Wound Description (Comments): right great toe  DO NOT USE:  Present on Admission:   Dressing Type None 06/21/24 0801     Pressure Injury 03/06/24 Hip Anterior;Left;Proximal Unstageable - Full thickness tissue loss in which the base of the injury is covered by slough (yellow, tan, gray, green or brown) and/or eschar (tan, brown or black) in the wound bed. unstageable (Active)  03/06/24 1612  Location: Hip  Location Orientation: Anterior;Left;Proximal  Staging: Unstageable - Full thickness tissue loss in which the base of the injury is covered by slough (yellow, tan, gray, green or brown) and/or eschar (tan, brown or black) in the wound bed.  Wound Description (Comments): unstageable  DO NOT USE:  Present on Admission: No  Dressing Type Foam - Lift dressing to assess site every shift 06/21/24 0801     Pressure Injury 03/06/24 Shoulder Left Unstageable - Full thickness tissue loss in which the base of the injury is covered by slough (yellow, tan, gray, green or brown) and/or eschar (tan, brown or black) in the wound bed. unstageable (Active)  03/06/24 1613  Location: Shoulder  Location Orientation: Left  Staging: Unstageable - Full thickness tissue loss in which the base of the injury is covered by slough (yellow, tan, gray, green or brown) and/or eschar (tan, brown or black) in the wound bed.  Wound Description (Comments): unstageable  DO NOT USE:  Present on Admission: No  Dressing Type None 06/21/24 0801     Pressure Injury 04/23/24 Heel Right Unstageable - Full thickness tissue loss in which  the base of the injury is covered by slough (yellow,  tan, gray, green or brown) and/or eschar (tan, brown or black) in the wound bed. DTPI evolving into unstageable (Active)  04/23/24   Location: Heel  Location Orientation: Right  Staging: Unstageable - Full thickness tissue loss in which the base of the injury is covered by slough (yellow, tan, gray, green or brown) and/or eschar (tan, brown or black) in the wound bed.  Wound Description (Comments): DTPI evolving into unstageable  DO NOT USE:  Present on Admission: No  Dressing Type Foam - Lift dressing to assess site every shift 06/21/24 0801     Pressure Injury 04/23/24 Heel Left Deep Tissue Pressure Injury - Purple or maroon localized area of discolored intact skin or blood-filled blister due to damage of underlying soft tissue from pressure and/or shear. (Active)  04/23/24   Location: Heel  Location Orientation: Left  Staging: Deep Tissue Pressure Injury - Purple or maroon localized area of discolored intact skin or blood-filled blister due to damage of underlying soft tissue from pressure and/or shear.  Wound Description (Comments):   DO NOT USE:  Present on Admission: No  Dressing Type Foam - Lift dressing to assess site every shift 06/21/24 0801    DVT prophylaxis:SCDs Start: 03/04/24 2151 Place TED hose Start: 03/04/24 2151 apixaban  (ELIQUIS ) tablet 5 mg     Code Status: Limited: Do not attempt resuscitation (DNR) -DNR-LIMITED -Do Not Intubate/DNI   Family Communication: Called and discussed with daughter Para on phone on 7/31  Patient status:Inpatient  Patient is from :home  Anticipated discharge to:not sure  Estimated DC date:not sure   Consultants: Cardiology, general surgery, PCCM  Procedures: Wound excision and debridement, thoracentesis  Antimicrobials:  Anti-infectives (From admission, onward)    Start     Dose/Rate Route Frequency Ordered Stop   04/27/24 1615  cefTAZidime  (FORTAZ ) 2 g in sodium chloride  0.9 % 100 mL IVPB        2 g 200 mL/hr over 30  Minutes Intravenous Every 8 hours 04/27/24 1518 05/03/24 2359   03/16/24 1015  cefTRIAXone  (ROCEPHIN ) 2 g in sodium chloride  0.9 % 100 mL IVPB        2 g 200 mL/hr over 30 Minutes Intravenous Every 24 hours 03/16/24 0929 03/20/24 1219   03/16/24 1015  doxycycline  (VIBRA -TABS) tablet 100 mg        100 mg Oral Every 12 hours 03/16/24 0929 03/20/24 2131   03/14/24 1000  metroNIDAZOLE  (FLAGYL ) tablet 500 mg        500 mg Oral Every 12 hours 03/14/24 0803 03/19/24 2219   03/13/24 1000  cefadroxil  (DURICEF) capsule 1,000 mg  Status:  Discontinued        1,000 mg Oral 2 times daily 03/12/24 1632 03/16/24 0929   03/07/24 2200  metroNIDAZOLE  (FLAGYL ) tablet 500 mg        500 mg Oral Every 12 hours 03/07/24 0840 03/11/24 2258   03/05/24 1930  vancomycin  (VANCOREADY) IVPB 1250 mg/250 mL  Status:  Discontinued        1,250 mg 166.7 mL/hr over 90 Minutes Intravenous Every 24 hours 03/04/24 2205 03/04/24 2224   03/05/24 1930  Vancomycin  (VANCOCIN ) 1,250 mg in sodium chloride  0.9 % 250 mL IVPB  Status:  Discontinued        1,250 mg 166.7 mL/hr over 90 Minutes Intravenous Every 24 hours 03/04/24 2224 03/05/24 0717   03/05/24 1930  vancomycin  (VANCOREADY) IVPB 1500 mg/300 mL  Status:  Discontinued  1,500 mg 150 mL/hr over 120 Minutes Intravenous Every 24 hours 03/05/24 0717 03/12/24 1636   03/05/24 1400  ceFEPIme  (MAXIPIME ) 2 g in sodium chloride  0.9 % 100 mL IVPB  Status:  Discontinued        2 g 200 mL/hr over 30 Minutes Intravenous Every 8 hours 03/05/24 0717 03/12/24 1632   03/05/24 0600  ceFEPIme  (MAXIPIME ) 2 g in sodium chloride  0.9 % 100 mL IVPB  Status:  Discontinued        2 g 200 mL/hr over 30 Minutes Intravenous Every 12 hours 03/04/24 2205 03/05/24 0717   03/05/24 0000  metroNIDAZOLE  (FLAGYL ) IVPB 500 mg  Status:  Discontinued        500 mg 100 mL/hr over 60 Minutes Intravenous Every 12 hours 03/04/24 2206 03/04/24 2209   03/04/24 1730  aztreonam (AZACTAM) 2 g in sodium chloride  0.9 %  100 mL IVPB  Status:  Discontinued        2 g 200 mL/hr over 30 Minutes Intravenous  Once 03/04/24 1715 03/04/24 1724   03/04/24 1730  metroNIDAZOLE  (FLAGYL ) IVPB 500 mg  Status:  Discontinued        500 mg 100 mL/hr over 60 Minutes Intravenous Every 12 hours 03/04/24 1715 03/07/24 0840   03/04/24 1730  vancomycin  (VANCOREADY) IVPB 1500 mg/300 mL        1,500 mg 150 mL/hr over 120 Minutes Intravenous  Once 03/04/24 1715 03/04/24 2153   03/04/24 1730  ceFEPIme  (MAXIPIME ) 2 g in sodium chloride  0.9 % 100 mL IVPB        2 g 200 mL/hr over 30 Minutes Intravenous  Once 03/04/24 1724 03/04/24 1826       Subjective: Patient seen and examined at bedside today.  Overall comfortable, on room air.  Does not complain of shortness of breath or cough.  Chronically deconditioned, weak, mostly bedbound  Objective: Vitals:   06/21/24 0446 06/21/24 0805 06/21/24 1240 06/21/24 1609  BP: 117/77 116/75 (!) 105/53 120/74  Pulse: 77 80 75 86  Resp: 20  (!) 22   Temp: (!) 97.5 F (36.4 C) 98.3 F (36.8 C) 98.6 F (37 C) 98.4 F (36.9 C)  TempSrc: Oral  Oral   SpO2: 100% 100%  100%  Weight:      Height:        Intake/Output Summary (Last 24 hours) at 06/21/2024 1749 Last data filed at 06/21/2024 0905 Gross per 24 hour  Intake 250 ml  Output 400 ml  Net -150 ml   Filed Weights   06/11/24 0100 06/12/24 0601 06/13/24 0128  Weight: 63 kg 63.5 kg 63.5 kg    Examination:   General exam: Overall comfortable, not in distress, chronically deconditioned, chronically ill looking HEENT: PERRL Respiratory system:  no wheezes or crackles .  Diminished sounds in bases Cardiovascular system: S1 & S2 heard, RRR.  Gastrointestinal system: Abdomen is nondistended, soft and nontender. Central nervous system: Alert and oriented, residual right-sided weakness Extremities: No edema, no clubbing ,no cyanosis Skin: pressure ulcers     Data Reviewed: I have personally reviewed following labs and imaging  studies  CBC: Recent Labs  Lab 06/15/24 0607 06/21/24 0304  WBC 5.3 5.4  HGB 12.1* 12.5*  HCT 36.4* 36.9*  MCV 87.5 85.6  PLT 247 306   Basic Metabolic Panel: Recent Labs  Lab 06/15/24 0607 06/21/24 0304  NA 136 139  K 4.4 4.9  CL 108 108  CO2 20* 22  GLUCOSE 117* 110*  BUN  25* 29*  CREATININE 0.89 1.00  CALCIUM  8.3* 8.8*     No results found for this or any previous visit (from the past 240 hours).    Radiology Studies: DG Abd 1 View Result Date: 06/21/2024 CLINICAL DATA:  Constipation. EXAM: ABDOMEN - 1 VIEW COMPARISON:  05/09/2024. FINDINGS: Nonobstructive bowel gas pattern. Moderate volume of stool throughout the colon. No radio-opaque calculi or other significant radiographic abnormality are seen. No acute osseous abnormality. IMPRESSION: Nonobstructive bowel gas pattern. Moderate volume of stool throughout the colon. Electronically Signed   By: Harrietta Sherry M.D.   On: 06/21/2024 16:02    Scheduled Meds:  [START ON 06/22/2024] (feeding supplement) PROSource Plus  30 mL Oral BID BM   acetaminophen   650 mg Oral Q8H   amiodarone   200 mg Oral Daily   apixaban   5 mg Oral BID   atorvastatin   40 mg Oral Daily   calcium  carbonate  1 tablet Oral TID   collagenase    Topical Daily   diclofenac  Sodium  4 g Topical QID   [START ON 06/22/2024] feeding supplement  237 mL Oral BID BM   ferrous sulfate   325 mg Oral QHS   furosemide   20 mg Oral Daily   guaiFENesin   600 mg Oral BID   insulin  aspart  0-5 Units Subcutaneous QHS   insulin  aspart  0-9 Units Subcutaneous TID WC   insulin  glargine-yfgn  6 Units Subcutaneous Daily   levothyroxine   50 mcg Oral Q0600   lidocaine   1 patch Transdermal Q24H   liver oil-zinc  oxide   Topical BID   multivitamin with minerals  1 tablet Oral Daily   [START ON 06/22/2024] nutrition supplement (JUVEN)  1 packet Oral BID BM   mouth rinse  15 mL Mouth Rinse 4 times per day   polyethylene glycol  17 g Oral BID   potassium chloride   20 mEq Oral  Daily   senna-docusate  1 tablet Oral QHS   sertraline   50 mg Oral Daily   sodium chloride  flush  10-40 mL Intracatheter Q12H   Tafamidis   1 capsule Oral Daily   vutrisiran  sodium  25 mg Subcutaneous Q90 days   Continuous Infusions:  dextrose        LOS: 109 days   Leatrice LILLETTE Chapel, MD [facility] P8/11/2023, 5:49 PM

## 2024-06-21 NOTE — Progress Notes (Addendum)
 Daily Progress Note   Patient Name: Earl [name]       Date: 06/21/2024 DOB: [birth year]  Age: 83 y.o. MRN#: [id number] Attending Physician: Rosario Leatrice FERNS, MD Primary Care Physician: [name], MD Admit Date: 03/04/2024  Reason for Consultation/Follow-up: Establishing goals of care  Subjective: Medical records reviewed including progress notes, labs, imaging. Patient assessed at the bedside. He reports feeling a little jittery waking up this morning. He is satisfied with current symptom management regimen. Discussed with RN.   Created space and opportunity for patient's thoughts and feelings on his current illness. He shares his feeling of something being off but not knowing what it is. We discussed his recent thoracenteses. His understanding is that he will be in the hospital until his ulcers are at stage 2. Confirms his goals are to continue efforts to improve and he is aware of the opportunity for further discussions if his goals of care change. No other needs at this time.  Called patient's daughter to follow up on her questions yesterday but was unable to reach. Left voicemail with PMT contact information.  She called back and I listened to her concerns. She asked about patient's prognosis and I shared my opinion of months to a year with his acute chronic problems. Emotional support and therapeutic was provided. She remains concerned about wound care quality and worsening hip ulcer, as well as poor communication and lack of empathy towards patient's requests for things like X-ray and an enema. She has not found patient experience to be very helpful in the past. She is also concerned about possible bowel obstruction from CT abdomen 7/13 and would like follow up with patient's attending, though she understands new MD is just  coming on and will need some time to get acclimated to his case. She strongly voices preference for previous attending not to be assigned to patient again. Discussed with MD via secure chat.   Questions and concerns addressed. PMT will continue to support holistically.  Length of Stay: 109   Physical Exam Vitals and nursing note reviewed.  Constitutional:      General: He is not in acute distress.    Appearance: He is ill-appearing.  Cardiovascular:     Rate and Rhythm: Normal rate.  Pulmonary:     Effort: Pulmonary effort is normal.  Skin:    General: Skin is warm and dry.  Neurological:     Mental Status: He is alert. Mental status is at baseline.  Psychiatric:        Behavior: Behavior normal. Behavior is cooperative.            Vital Signs: BP 116/75 (BP Location: Left Arm)   Pulse 80   Temp 98.3 F (36.8 C)   Resp 20   Ht 5' 11 (1.803 m)   Wt 63.5 kg   SpO2 100%   BMI 19.52 kg/m  SpO2: SpO2: 100 % O2 Device: O2 Device: Room Air O2 Flow Rate:        Palliative Assessment/Data: 20% to 30%   Palliative Care Assessment & Plan   Patient Profile: Per intake H&P --> Patient is a 83 year old male with history of paroxysmal A-fib, insulin -dependent diabetes, hypertension, hyperlipidemia,  CVA with residual right-sided weakness, MGUS, GERD, BPH, hypothyroidism, HFrEF with EF of 40 to 45%, chronic pancytopenia who presented to the emergency department from SNF with complaint of shortness of breath, wound, pain in the sacral area.    Palliative care evaluated Dave earlier this month and have been asked to get re-involved for additional goals of care conversations.   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 Recent COVID infection Severe protein calorie malnutrition  Recommendations/Plan: Continue DNR/DNI Continue  current care plan, goals of care remain consistent for medical optimization and return home to continue managing chronic illnesses Psychosocial and emotional support provided PMT will continue to follow and support intermittently   Prognosis: Concerning prognosis in light of recurrent hospitalizations, declining functional/nutritional status, and several chronic comorbidities  Discharge Planning: Home with Palliative Services   Care plan was discussed with patient, RN, patient's daughter, MD    Mickle SHAUNNA Fell, PA-C  Palliative Medicine Team Team phone # [phone]  Thank you for allowing the Palliative Medicine Team to assist in the care of this patient. Please utilize secure chat with additional questions, if there is no response within 30 minutes please call the above phone number.  Palliative Medicine Team providers are available by phone from 7am to 7pm daily and can be reached through the team cell phone.  Should this patient require assistance outside of these hours, please call the patient's attending physician.    Time Total: 50  Visit consisted of counseling and education dealing with the complex and emotionally intense issues of symptom management and palliative care in the setting of serious and potentially life-threatening illness. Greater than 50% of this time was spent counseling and coordinating care related to the above assessment and plan.  Personally spent 50 minutes in patient care including extensive chart review (labs, imaging, progress/consult notes, vital signs), medically appropraite exam, discussed with treatment team, education to patient, family, and staff, documenting clinical information, medication review and management, coordination of care, and available advanced directive documents.

## 2024-06-21 NOTE — Plan of Care (Signed)
  Problem: Fluid Volume: Goal: Hemodynamic stability will improve Outcome: Progressing   Problem: Clinical Measurements: Goal: Diagnostic test results will improve Outcome: Progressing Goal: Signs and symptoms of infection will decrease Outcome: Progressing   Problem: Education: Goal: Knowledge of risk factors and measures for prevention of condition will improve Outcome: Progressing   Problem: Coping: Goal: Psychosocial and spiritual needs will be supported Outcome: Progressing   Problem: Respiratory: Goal: Will maintain a patent airway Outcome: Progressing Goal: Complications related to the disease process, condition or treatment will be avoided or minimized Outcome: Progressing   Problem: Education: Goal: Ability to describe self-care measures that may prevent or decrease complications (Diabetes Survival Skills Education) will improve Outcome: Progressing   Problem: Coping: Goal: Ability to adjust to condition or change in health will improve Outcome: Progressing   Problem: Fluid Volume: Goal: Ability to maintain a balanced intake and output will improve Outcome: Progressing   Problem: Health Behavior/Discharge Planning: Goal: Ability to manage health-related needs will improve Outcome: Progressing   Problem: Metabolic: Goal: Ability to maintain appropriate glucose levels will improve Outcome: Progressing   Problem: Nutritional: Goal: Maintenance of adequate nutrition will improve Outcome: Progressing Goal: Progress toward achieving an optimal weight will improve Outcome: Progressing   Problem: Skin Integrity: Goal: Risk for impaired skin integrity will decrease Outcome: Progressing   Problem: Tissue Perfusion: Goal: Adequacy of tissue perfusion will improve Outcome: Progressing   Problem: Education: Goal: Knowledge of General Education information will improve Description: Including pain rating scale, medication(s)/side effects and non-pharmacologic  comfort measures Outcome: Progressing   Problem: Health Behavior/Discharge Planning: Goal: Ability to manage health-related needs will improve Outcome: Progressing   Problem: Clinical Measurements: Goal: Ability to maintain clinical measurements within normal limits will improve Outcome: Progressing Goal: Will remain free from infection Outcome: Progressing Goal: Diagnostic test results will improve Outcome: Progressing Goal: Respiratory complications will improve Outcome: Progressing Goal: Cardiovascular complication will be avoided Outcome: Progressing   Problem: Activity: Goal: Risk for activity intolerance will decrease Outcome: Progressing   Problem: Nutrition: Goal: Adequate nutrition will be maintained Outcome: Progressing   Problem: Coping: Goal: Level of anxiety will decrease Outcome: Progressing   Problem: Elimination: Goal: Will not experience complications related to bowel motility Outcome: Progressing Goal: Will not experience complications related to urinary retention Outcome: Progressing   Problem: Pain Managment: Goal: General experience of comfort will improve and/or be controlled Outcome: Progressing   Problem: Safety: Goal: Ability to remain free from injury will improve Outcome: Progressing   Problem: Skin Integrity: Goal: Risk for impaired skin integrity will decrease Outcome: Progressing

## 2024-06-21 NOTE — Progress Notes (Signed)
 Nutrition Follow-up  DOCUMENTATION CODES:  Severe malnutrition in context of chronic illness  INTERVENTION:  Continue dysphagia 1 diet as ordered Continue Juven BID to support ongoing wound healing needs Add and mix 30 ml ProSource Plus BID with Juven, each supplement provides 100 kcals and 15 grams protein.  Reduce Ensure Plus High Protein po from TID to BID, each supplement provides 350 kcal and 20 grams of protein. Check weights weekly  NUTRITION DIAGNOSIS:  Severe Malnutrition related to chronic illness as evidenced by energy intake < or equal to 75% for > or equal to 1 month, severe muscle depletion. - remains applicable  GOAL:  Patient will meet greater than or equal to 90% of their needs - progressing  MONITOR:  PO intake, Supplement acceptance, Weight trends, Skin  REASON FOR ASSESSMENT:  Consult Assessment of nutrition requirement/status  ASSESSMENT:  83 year old male who presented to the ED on 03/04/24 from [facility] due to sacral pain. PMH of T2DM, HTN, HLD, CHF, atrial fibrillation, stroke with right-sided weakness, GERD, BPH, hypothyroidism, MGUS, transthyretin associated amyloid cardiomyopathy, chronic pancytopenia, recent hospital admission for COVID-19. Pt admitted with sepsis secondary to sacral pressure injury infection.  PMT continues to follow for ongoing support.  TOC working on discharge planning.  Pt currently hemodynamically stable.  S/p thoracentesis yield on 7/30  Checked in with patient at bedside. He states that on average, he will take about 8-9 bites of food and feel full, complaining of early satiety. RN reports that he has been increasingly self feeding however still requires some assistance with meals.  She mentions that he has been eating much better overall. He is not consuming all nutrition supplements given the volume (3 Ensure and 2 Juven daily). Pt even endorses feeling full with fluids to administer medications.  Will adjust  supplements to continue to provide vitamin regimen for wound healing and still provide protein though with less volume. Pt is amenable to this plan.   Calorie count envelope still hanging on door. Briefly reviewed. Overall, pt appears to be eating >/=50% of most items on meal trays. At times he is even consuming 100% of most items on meal tray.   Admission weight history: 04/14: 68.5 kg 06/01: 78.5 kg 06/15: 86.6 kg 07/02: 79.8 kg 07/04: 72.6 kg 07/24: 63.5 kg  Medications: TUMS TID, ferrous sulfate  daily, lasix  20mg  daily, SSI 0-5 at bedtime, SSI 0-9 units TID, semglee  6 units daily, MVI, miralax  BID, klor-con , senna daily  Labs:  BUN 29 Vitamin A  6.9 (L) Vitamin C  2.3 (H) CBG's 88-157 x24 hours  Diet Order:   Diet Order             DIET - DYS 1 Room service appropriate? No; Fluid consistency: Thin  Diet effective now                   EDUCATION NEEDS:   No education needs have been identified at this time  Skin:  Skin Assessment: Skin Integrity Issues: Per WOC note 7/29: Left shoulder: 3 cm x 2 cm  Left trochanter: 3 cm x 3 cm x 1.5 cm Right heel: 3 cm x 4 cm Sacrum: 6 cm x 3 cm x 0.5 cm with 2 cm undermining from 12 o'clock to 3 o'clock Left medial foot: small opening less than 0.1 cm  Last BM:  7/31 type 6 large  Height:   Ht Readings from Last 1 Encounters:  03/04/24 5' 11 (1.803 m)    Weight:   Wt  Readings from Last 1 Encounters:  06/13/24 63.5 kg    Ideal Body Weight:  78.2 kg  BMI:  Body mass index is 19.52 kg/m.  Estimated Nutritional Needs:   Kcal:  1900-2100  Protein:  100-115g  Fluid:  >/=1.9L  Earl [name], RDN, LDN Clinical Nutrition See AMiON for contact information.

## 2024-06-21 DEATH — deceased

## 2024-06-22 DIAGNOSIS — R6520 Severe sepsis without septic shock: Secondary | ICD-10-CM | POA: Diagnosis not present

## 2024-06-22 DIAGNOSIS — A419 Sepsis, unspecified organism: Secondary | ICD-10-CM | POA: Diagnosis not present

## 2024-06-22 LAB — GLUCOSE, CAPILLARY
Glucose-Capillary: 163 mg/dL — ABNORMAL HIGH (ref 70–99)
Glucose-Capillary: 212 mg/dL — ABNORMAL HIGH (ref 70–99)
Glucose-Capillary: 239 mg/dL — ABNORMAL HIGH (ref 70–99)
Glucose-Capillary: 237 mg/dL — ABNORMAL HIGH (ref 70–99)
Glucose-Capillary: 255 mg/dL — ABNORMAL HIGH (ref 70–99)

## 2024-06-22 NOTE — Progress Notes (Signed)
 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  Hospital course was remarkable for acute metabolic encephalopathy, hypotension, hypoglycemia.  Found to have worsening sacral ulcers.  General surgery consulted.Status post bedside excisional debridement of the sacral wound on 4/15 and 4/17.  Prolonged hospitalization due to reluctance of family to take him home because of pressure ulcers.They are anticipating the ulcers to heal before they can take him home.TOC assisting with disposition  06/22/2024: Patient seen.  No new changes.  Discussed with patient's nurse extensively.  Abdominal x-ray findings noted.  Assessment & Plan:  Principal Problem:   Severe sepsis (HCC) Active Problems:   Paroxysmal atrial fibrillation (HCC)   MGUS (monoclonal gammopathy of unknown significance)   Essential hypertension   Hyperlipidemia   Insulin  dependent type 2 diabetes mellitus (HCC)   History of CVA (cerebrovascular accident)   Cardiac amyloidosis (HCC)   Chronic systolic CHF (congestive heart failure) (HCC)   Sepsis (HCC)   Atrial flutter (HCC)   GERD (gastroesophageal reflux disease)   Sacral ulcer (HCC)   History of hypertension   Atrial fibrillation with RVR (HCC)   Community acquired pneumonia   Protein-calorie malnutrition, severe   Wild-type transthyretin-related (ATTR) amyloidosis (HCC)   Sacral decubitus ulcer, stage IV (HCC)  Sepsis secondary to sacral pressure wound/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  Hypotensive, encephalopathic, hypoglycemic, elevated lactate, leukocytosis on presentation. Given IV fluid, broad spectrum antibiotics.  Cultures:NGTD.  Completed antibiotics course.  Currently hemodynamically stable. 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  General surgery consulted,   PT consulted for hydrotherapy. Started  zinc , vitamin C .Status post bedside excisional debridement of the sacral wound on 4/15 and 4/17.  Also seen by ID.  Completed antibiotics course. CT on 6/16 concern for possible osteomyelitis. Surgery consulted and performed wound debridement during this hospitalization. Plastic surgery was also consulted with recommendation for continued wound care and no surgical management.Continue current wound care, continue pressure relieving therapies. Wound care nurse following. 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 The ulcer itself is less concave in appearance compared to prior. No new focal drainable fluid collection.  Also has left trochanteric ulceration with yellow sloughing. Debulked by ortho at the bedside on 7/14, wound care on board. Skin protected to the hip with VAC drape for foam bridge .  Wound care nurse closely following   Paroxysmal A-fib: Presented with A-fib with RVR.   Cardiology was following.   Digoxin  discontinued.  Amiodarone  changed to oral.  Eliquis   restarted.  Currently rate is well-controlled:mostly in NSR   History of HFrEF/transthyretin cardiac amyloidosis/nonischemic cardiomyopathy/hypotension:Continue  Tafamidis  and vutrisiran .Continue with  low dose lasix   Recurrent bilateral pleural effusion: Underwent left-sided thoracentesis on 6/18.  Body fluid culture without any growth.  Also underwent bilateral thoracentesis  on 7/14 by pulmonology .Pleural fluid was transudative in nature  CXR on 7/30 again showed bilateral pleural effusions,more on right.Underwent IR guided   thoracentesis on right with removal of 500 mL of pleural fluid.  Remains on room air  Insulin -dependent diabetes type 2: Presented with hyperglycemia.  Recent A1c in the range of 8.  Diabetic coordinator was consulted and following.  Currently on sliding scale, Lantus    History of CVA: Has a right-sided residual weakness.  On Eliquis , Lipitor.  Complains of intermittent dizziness.  Already on meclizine    GERD: Continue Protonix    Anemia of chronic disease: Continue iron supplementation   Hypothyroidism: Continue levothyroxine   Constipation: Continue bowel regimen    Severe protein calorie malnutrition/suspicion for dysphagia/hypophosphatemia:   Dietitian was consulted.  Goals of care: Very deconditioned elderly male with bedbound status, multiple comorbidities, sacral pressure ulcer.  CODE STATUS DNR.  Palliative care consulted for goals of care.   PT/OT consulted.  Recommended SNF on discharge.  TOC following for placement      Nutrition Problem: Severe Malnutrition Etiology: chronic illness Pressure Injury 02/26/24 Coccyx Mid Stage 4 - Full thickness tissue loss with exposed bone, tendon or muscle. (Active)  02/26/24 0916  Location: Coccyx  Location Orientation: Mid  Staging: Stage 4 - Full thickness tissue loss with exposed bone, tendon or muscle.  Wound Description (Comments):   DO NOT USE:  Present on Admission: Yes  Dressing Type Foam - Lift dressing to assess site every shift 06/22/24 0635     Pressure Injury 02/26/24 Toe (Comment  which one) Anterior;Right Deep Tissue Pressure Injury - Purple or maroon  localized area of discolored intact skin or blood-filled blister due to damage of underlying soft tissue from pressure and/or shear. right gre (Active)  02/26/24 1215  Location: Toe (Comment  which one)  Location Orientation: Anterior;Right  Staging: Deep Tissue Pressure Injury - Purple or maroon localized area of discolored intact skin or blood-filled blister due to damage of underlying soft tissue from pressure and/or shear.  Wound Description (Comments): right great toe  DO NOT USE:  Present on Admission:   Dressing Type None 06/22/24 0635     Pressure Injury 03/06/24 Hip Anterior;Left;Proximal Unstageable - Full thickness tissue loss in which the base of the injury is covered by slough (yellow, tan, gray, green or brown) and/or eschar (tan, brown or black) in the wound bed. unstageable (Active)  03/06/24 1612  Location: Hip  Location Orientation: Anterior;Left;Proximal  Staging: Unstageable - Full thickness tissue loss in which the base of the injury is covered by slough (yellow, tan, gray, green or brown) and/or eschar (tan, brown or black) in the wound bed.  Wound Description (Comments): unstageable  DO NOT USE:  Present on Admission: No  Dressing Type Foam - Lift dressing to assess site every shift 06/22/24 0635     Pressure Injury 03/06/24 Shoulder Left Unstageable - Full thickness tissue loss in which the base of the injury is covered by slough (yellow, tan, gray, green or brown) and/or eschar (tan, brown or black) in the wound bed. unstageable (Active)  03/06/24 1613  Location: Shoulder  Location Orientation: Left  Staging: Unstageable - Full thickness tissue loss in which the base of the injury is covered by slough (yellow, tan, gray, green or brown) and/or eschar (tan, brown or black) in the wound bed.  Wound Description (Comments): unstageable  DO NOT USE:  Present on Admission: No  Dressing Type None 06/22/24 0635     Pressure Injury  04/23/24 Heel Right Unstageable - Full  thickness tissue loss in which the base of the injury is covered by slough (yellow, tan, gray, green or brown) and/or eschar (tan, brown or black) in the wound bed. DTPI evolving into unstageable (Active)  04/23/24   Location: Heel  Location Orientation: Right  Staging: Unstageable - Full thickness tissue loss in which the base of the injury is covered by slough (yellow, tan, gray, green or brown) and/or eschar (tan, brown or black) in the wound bed.  Wound Description (Comments): DTPI evolving into unstageable  DO NOT USE:  Present on Admission: No  Dressing Type Foam - Lift dressing to assess site every shift 06/22/24 0635     Pressure Injury 04/23/24 Heel Left Deep Tissue Pressure Injury - Purple or maroon localized area of discolored intact skin or blood-filled blister due to damage of underlying soft tissue from pressure and/or shear. (Active)  04/23/24   Location: Heel  Location Orientation: Left  Staging: Deep Tissue Pressure Injury - Purple or maroon localized area of discolored intact skin or blood-filled blister due to damage of underlying soft tissue from pressure and/or shear.  Wound Description (Comments):   DO NOT USE:  Present on Admission: No  Dressing Type Foam - Lift dressing to assess site every shift 06/22/24 0635    DVT prophylaxis:SCDs Start: 03/04/24 2151 Place TED hose Start: 03/04/24 2151 apixaban  (ELIQUIS ) tablet 5 mg     Code Status: Limited: Do not attempt resuscitation (DNR) -DNR-LIMITED -Do Not Intubate/DNI   Family Communication: Called and discussed with daughter Para on phone on 7/31  Patient status:Inpatient  Patient is from :home  Anticipated discharge to:not sure  Estimated DC date:not sure   Consultants: Cardiology, general surgery, PCCM  Procedures: Wound excision and debridement, thoracentesis  Antimicrobials:  Anti-infectives (From admission, onward)    Start     Dose/Rate Route Frequency Ordered Stop   04/27/24 1615  cefTAZidime   (FORTAZ ) 2 g in sodium chloride  0.9 % 100 mL IVPB        2 g 200 mL/hr over 30 Minutes Intravenous Every 8 hours 04/27/24 1518 05/03/24 2359   03/16/24 1015  cefTRIAXone  (ROCEPHIN ) 2 g in sodium chloride  0.9 % 100 mL IVPB        2 g 200 mL/hr over 30 Minutes Intravenous Every 24 hours 03/16/24 0929 03/20/24 1219   03/16/24 1015  doxycycline  (VIBRA -TABS) tablet 100 mg        100 mg Oral Every 12 hours 03/16/24 0929 03/20/24 2131   03/14/24 1000  metroNIDAZOLE  (FLAGYL ) tablet 500 mg        500 mg Oral Every 12 hours 03/14/24 0803 03/19/24 2219   03/13/24 1000  cefadroxil  (DURICEF) capsule 1,000 mg  Status:  Discontinued        1,000 mg Oral 2 times daily 03/12/24 1632 03/16/24 0929   03/07/24 2200  metroNIDAZOLE  (FLAGYL ) tablet 500 mg        500 mg Oral Every 12 hours 03/07/24 0840 03/11/24 2258   03/05/24 1930  vancomycin  (VANCOREADY) IVPB 1250 mg/250 mL  Status:  Discontinued        1,250 mg 166.7 mL/hr over 90 Minutes Intravenous Every 24 hours 03/04/24 2205 03/04/24 2224   03/05/24 1930  Vancomycin  (VANCOCIN ) 1,250 mg in sodium chloride  0.9 % 250 mL IVPB  Status:  Discontinued        1,250 mg 166.7 mL/hr over 90 Minutes Intravenous Every 24 hours 03/04/24 2224 03/05/24 0717   03/05/24 1930  vancomycin  (VANCOREADY) IVPB 1500 mg/300 mL  Status:  Discontinued        1,500 mg 150 mL/hr over 120 Minutes Intravenous Every 24 hours 03/05/24 0717 03/12/24 1636   03/05/24 1400  ceFEPIme  (MAXIPIME ) 2 g in sodium chloride  0.9 % 100 mL IVPB  Status:  Discontinued        2 g 200 mL/hr over 30 Minutes Intravenous Every 8 hours 03/05/24 0717 03/12/24 1632   03/05/24 0600  ceFEPIme  (MAXIPIME ) 2 g in sodium chloride  0.9 % 100 mL IVPB  Status:  Discontinued        2 g 200 mL/hr over 30 Minutes Intravenous Every 12 hours 03/04/24 2205 03/05/24 0717   03/05/24 0000  metroNIDAZOLE  (FLAGYL ) IVPB 500 mg  Status:  Discontinued        500 mg 100 mL/hr over 60 Minutes Intravenous Every 12 hours 03/04/24 2206  03/04/24 2209   03/04/24 1730  aztreonam (AZACTAM) 2 g in sodium chloride  0.9 % 100 mL IVPB  Status:  Discontinued        2 g 200 mL/hr over 30 Minutes Intravenous  Once 03/04/24 1715 03/04/24 1724   03/04/24 1730  metroNIDAZOLE  (FLAGYL ) IVPB 500 mg  Status:  Discontinued        500 mg 100 mL/hr over 60 Minutes Intravenous Every 12 hours 03/04/24 1715 03/07/24 0840   03/04/24 1730  vancomycin  (VANCOREADY) IVPB 1500 mg/300 mL        1,500 mg 150 mL/hr over 120 Minutes Intravenous  Once 03/04/24 1715 03/04/24 2153   03/04/24 1730  ceFEPIme  (MAXIPIME ) 2 g in sodium chloride  0.9 % 100 mL IVPB        2 g 200 mL/hr over 30 Minutes Intravenous  Once 03/04/24 1724 03/04/24 1826       Subjective: Patient seen and examined at bedside today.  Overall comfortable, on room air.  Does not complain of shortness of breath or cough.  Chronically deconditioned, weak, mostly bedbound  Objective: Vitals:   06/21/24 2352 06/22/24 0406 06/22/24 0851 06/22/24 1129  BP: 111/81 119/75 110/72 127/70  Pulse: 64 72  82  Resp: 15 18 20    Temp: 97.6 F (36.4 C) (!) 97.5 F (36.4 C) 97.8 F (36.6 C) (!) 97.2 F (36.2 C)  TempSrc: Oral Oral Oral Axillary  SpO2: 100% 98% 100% 100%  Weight:      Height:        Intake/Output Summary (Last 24 hours) at 06/22/2024 1542 Last data filed at 06/22/2024 1224 Gross per 24 hour  Intake 803 ml  Output 1050 ml  Net -247 ml   Filed Weights   06/11/24 0100 06/12/24 0601 06/13/24 0128  Weight: 63 kg 63.5 kg 63.5 kg    Examination:   General exam: Overall comfortable, not in distress, chronically deconditioned, chronically ill looking HEENT: PERRL Respiratory system:  no wheezes or crackles .  Diminished sounds in bases Cardiovascular system: S1 & S2 heard, RRR.  Gastrointestinal system: Abdomen is nondistended, soft and nontender. Central nervous system: Alert and oriented, residual right-sided weakness Extremities: No edema, no clubbing ,no cyanosis Skin:  pressure ulcers     Data Reviewed: I have personally reviewed following labs and imaging studies  CBC: Recent Labs  Lab 06/21/24 0304  WBC 5.4  HGB 12.5*  HCT 36.9*  MCV 85.6  PLT 306   Basic Metabolic Panel: Recent Labs  Lab 06/21/24 0304  NA 139  K 4.9  CL 108  CO2 22  GLUCOSE 110*  BUN 29*  CREATININE 1.00  CALCIUM  8.8*     No results found for this or any previous visit (from the past 240 hours).    Radiology Studies: DG Abd 1 View Result Date: 06/21/2024 CLINICAL DATA:  Constipation. EXAM: ABDOMEN - 1 VIEW COMPARISON:  05/09/2024. FINDINGS: Nonobstructive bowel gas pattern. Moderate volume of stool throughout the colon. No radio-opaque calculi or other significant radiographic abnormality are seen. No acute osseous abnormality. IMPRESSION: Nonobstructive bowel gas pattern. Moderate volume of stool throughout the colon. Electronically Signed   By: Harrietta Sherry M.D.   On: 06/21/2024 16:02    Scheduled Meds:  (feeding supplement) PROSource Plus  30 mL Oral BID BM   acetaminophen   650 mg Oral Q8H   amiodarone   200 mg Oral Daily   apixaban   5 mg Oral BID   atorvastatin   40 mg Oral Daily   calcium  carbonate  1 tablet Oral TID   collagenase    Topical Daily   diclofenac  Sodium  4 g Topical QID   feeding supplement  237 mL Oral BID BM   ferrous sulfate   325 mg Oral QHS   furosemide   20 mg Oral Daily   guaiFENesin   600 mg Oral BID   insulin  aspart  0-5 Units Subcutaneous QHS   insulin  aspart  0-9 Units Subcutaneous TID WC   insulin  glargine-yfgn  6 Units Subcutaneous Daily   levothyroxine   50 mcg Oral Q0600   lidocaine   1 patch Transdermal Q24H   liver oil-zinc  oxide   Topical BID   multivitamin with minerals  1 tablet Oral Daily   nutrition supplement (JUVEN)  1 packet Oral BID BM   mouth rinse  15 mL Mouth Rinse 4 times per day   polyethylene glycol  17 g Oral BID   potassium chloride   20 mEq Oral Daily   senna-docusate  1 tablet Oral QHS   sertraline    50 mg Oral Daily   sodium chloride  flush  10-40 mL Intracatheter Q12H   Tafamidis   1 capsule Oral Daily   vutrisiran  sodium  25 mg Subcutaneous Q90 days   Continuous Infusions:  dextrose        LOS: 110 days   Leatrice LILLETTE Chapel, MD [facility] P8/12/2023, 3:42 PM

## 2024-06-22 NOTE — Progress Notes (Signed)
 9344 Wounds care  performed as prescribed.  Medicated. Made comfortable in bed, AO x 4.

## 2024-06-22 NOTE — Plan of Care (Signed)
  Problem: Fluid Volume: Goal: Hemodynamic stability will improve Outcome: Progressing   Problem: Clinical Measurements: Goal: Diagnostic test results will improve Outcome: Progressing Goal: Signs and symptoms of infection will decrease Outcome: Progressing   Problem: Education: Goal: Knowledge of risk factors and measures for prevention of condition will improve Outcome: Progressing   Problem: Coping: Goal: Psychosocial and spiritual needs will be supported Outcome: Progressing   Problem: Respiratory: Goal: Will maintain a patent airway Outcome: Progressing Goal: Complications related to the disease process, condition or treatment will be avoided or minimized Outcome: Progressing   Problem: Education: Goal: Ability to describe self-care measures that may prevent or decrease complications (Diabetes Survival Skills Education) will improve Outcome: Progressing   Problem: Coping: Goal: Ability to adjust to condition or change in health will improve Outcome: Progressing   Problem: Fluid Volume: Goal: Ability to maintain a balanced intake and output will improve Outcome: Progressing   Problem: Health Behavior/Discharge Planning: Goal: Ability to manage health-related needs will improve Outcome: Progressing   Problem: Metabolic: Goal: Ability to maintain appropriate glucose levels will improve Outcome: Progressing   Problem: Nutritional: Goal: Maintenance of adequate nutrition will improve Outcome: Progressing Goal: Progress toward achieving an optimal weight will improve Outcome: Progressing   Problem: Skin Integrity: Goal: Risk for impaired skin integrity will decrease Outcome: Progressing   Problem: Tissue Perfusion: Goal: Adequacy of tissue perfusion will improve Outcome: Progressing   Problem: Education: Goal: Knowledge of General Education information will improve Description: Including pain rating scale, medication(s)/side effects and non-pharmacologic  comfort measures Outcome: Progressing   Problem: Health Behavior/Discharge Planning: Goal: Ability to manage health-related needs will improve Outcome: Progressing   Problem: Clinical Measurements: Goal: Ability to maintain clinical measurements within normal limits will improve Outcome: Progressing Goal: Will remain free from infection Outcome: Progressing Goal: Diagnostic test results will improve Outcome: Progressing Goal: Respiratory complications will improve Outcome: Progressing Goal: Cardiovascular complication will be avoided Outcome: Progressing   Problem: Activity: Goal: Risk for activity intolerance will decrease Outcome: Progressing   Problem: Nutrition: Goal: Adequate nutrition will be maintained Outcome: Progressing   Problem: Coping: Goal: Level of anxiety will decrease Outcome: Progressing   Problem: Elimination: Goal: Will not experience complications related to bowel motility Outcome: Progressing Goal: Will not experience complications related to urinary retention Outcome: Progressing   Problem: Pain Managment: Goal: General experience of comfort will improve and/or be controlled Outcome: Progressing   Problem: Safety: Goal: Ability to remain free from injury will improve Outcome: Progressing   Problem: Skin Integrity: Goal: Risk for impaired skin integrity will decrease Outcome: Progressing

## 2024-06-23 DIAGNOSIS — R6520 Severe sepsis without septic shock: Secondary | ICD-10-CM | POA: Diagnosis not present

## 2024-06-23 DIAGNOSIS — A419 Sepsis, unspecified organism: Secondary | ICD-10-CM | POA: Diagnosis not present

## 2024-06-23 LAB — GLUCOSE, CAPILLARY
Glucose-Capillary: 126 mg/dL — ABNORMAL HIGH (ref 70–99)
Glucose-Capillary: 208 mg/dL — ABNORMAL HIGH (ref 70–99)
Glucose-Capillary: 158 mg/dL — ABNORMAL HIGH (ref 70–99)
Glucose-Capillary: 154 mg/dL — ABNORMAL HIGH (ref 70–99)

## 2024-06-23 MED ORDER — PANTOPRAZOLE SODIUM 40 MG PO TBEC
40.0000 mg | DELAYED_RELEASE_TABLET | Freq: Every day | ORAL | Status: DC
  Administered 2024-06-23 – 2024-08-21 (×63): 40 mg via ORAL
  Filled 2024-06-23 (×60): qty 1

## 2024-06-23 NOTE — Progress Notes (Signed)
 PROGRESS NOTE  Earl [name]  FMW:[id number] DOB: [birth year] DOA: 03/04/2024 PCP: [name], MD   Brief Narrative: Patient is a 83 year old male with history of paroxysmal A-fib, insulin -dependent diabetes, hypertension, hyperlipidemia, CVA with residual right-sided weakness, MGUS, GERD, BPH, hypothyroidism, HFrEF with EF of 40 to 45%, chronic pancytopenia who presented to the emergency department from SNF with complaint of shortness of breath, wound, pain in the sacral area.  Hospital course was remarkable for acute metabolic encephalopathy, hypotension, hypoglycemia.  Found to have worsening sacral ulcers.  General surgery consulted.Status post bedside excisional debridement of the sacral wound on 4/15 and 4/17.  Prolonged hospitalization due to reluctance of family to take him home because of pressure ulcers.They are anticipating the ulcers to heal before they can take him home.TOC assisting with disposition  06/23/2024: Patient seen alongside patient's nurse and daughter.  Discussed with patient's daughter extensively.  All questions answered.  Patient reports epigastric pain.  Will start patient on Protonix  40 Mg p.o. once daily.     Assessment & Plan:  Principal Problem:   Severe sepsis (HCC) Active Problems:   Paroxysmal atrial fibrillation (HCC)   MGUS (monoclonal gammopathy of unknown significance)   Essential hypertension   Hyperlipidemia   Insulin  dependent type 2 diabetes mellitus (HCC)   History of CVA (cerebrovascular accident)   Cardiac amyloidosis (HCC)   Chronic systolic CHF (congestive heart failure) (HCC)   Sepsis (HCC)   Atrial flutter (HCC)   GERD (gastroesophageal reflux disease)   Sacral ulcer (HCC)   History of hypertension   Atrial fibrillation with RVR (HCC)   Community acquired pneumonia   Protein-calorie malnutrition, severe   Wild-type transthyretin-related (ATTR) amyloidosis (HCC)   Sacral decubitus ulcer, stage IV (HCC)  Sepsis secondary  to sacral pressure wound/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  Hypotensive, encephalopathic, hypoglycemic, elevated lactate, leukocytosis on presentation. Given IV fluid, broad spectrum antibiotics.  Cultures:NGTD.  Completed antibiotics course.  Currently hemodynamically stable. 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  General surgery consulted,   PT consulted for hydrotherapy. Started  zinc , vitamin C .Status post bedside excisional debridement of the sacral wound on 4/15 and 4/17.  Also seen by ID.  Completed antibiotics course. CT on 6/16 concern for possible osteomyelitis. Surgery consulted and performed wound debridement during this hospitalization. Plastic surgery was also consulted with recommendation for continued wound care and no surgical management.Continue current wound care, continue pressure relieving therapies. Wound care nurse following. 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 The ulcer itself is less concave in appearance compared to prior. No new focal drainable fluid collection.  Also has left trochanteric ulceration with yellow sloughing. Debulked by ortho at the bedside on 7/14, wound care on board. Skin protected to the hip with VAC drape for foam bridge .  Wound care nurse closely following   Paroxysmal A-fib: Presented with A-fib with RVR.   Cardiology was following.   Digoxin  discontinued.  Amiodarone  changed to oral.  Eliquis   restarted.  Rate is controlled.     History of  HFrEF/transthyretin cardiac amyloidosis/nonischemic cardiomyopathy/hypotension:Continue Tafamidis  and vutrisiran .Continue with  low dose lasix   Recurrent bilateral pleural  effusion: Underwent left-sided thoracentesis on 6/18.  Body fluid culture without any growth.  Also underwent bilateral thoracentesis on 7/14 by pulmonology .Pleural fluid was transudative in nature  CXR on 7/30 again showed bilateral pleural effusions,more on right.Underwent IR guided   thoracentesis on right with removal of 500 mL of pleural fluid.  Remains on room air  Insulin -dependent diabetes type 2: Presented with hyperglycemia.  Recent A1c in the range of 8.  Diabetic coordinator was consulted and following.  Currently on sliding scale, Lantus    History of CVA: Has a right-sided residual weakness.  On Eliquis , Lipitor.  Complains of intermittent dizziness.  Already on meclizine    GERD epigastric pain:      Anemia of chronic disease: Continue iron supplementation   Hypothyroidism: Continue levothyroxine   Constipation: Continue bowel regimen    Severe protein calorie malnutrition/suspicion for dysphagia/hypophosphatemia:   Dietitian was consulted.  Goals of care: Very deconditioned elderly male with bedbound status, multiple comorbidities, sacral pressure ulcer.  CODE STATUS DNR.  Palliative care consulted for goals of care.   PT/OT consulted.  Recommended SNF on discharge.  TOC following for placement      Nutrition Problem: Severe Malnutrition Etiology: chronic illness Pressure Injury 02/26/24 Coccyx Mid Stage 4 - Full thickness tissue loss with exposed bone, tendon or muscle. (Active)  02/26/24 0916  Location: Coccyx  Location Orientation: Mid  Staging: Stage 4 - Full thickness tissue loss with exposed bone, tendon or muscle.  Wound Description (Comments):   DO NOT USE:  Present on Admission: Yes  Dressing Type Foam - Lift dressing to assess site every shift 06/22/24 0635     Pressure Injury 02/26/24  Toe (Comment  which one) Anterior;Right Deep Tissue Pressure Injury - Purple or maroon localized area of discolored intact skin or blood-filled blister due to damage of underlying soft tissue from pressure and/or shear. right gre (Active)  02/26/24 1215  Location: Toe (Comment  which one)  Location Orientation: Anterior;Right  Staging: Deep Tissue Pressure Injury - Purple or maroon localized area of discolored intact skin or blood-filled blister due to damage of underlying soft tissue from pressure and/or shear.  Wound Description (Comments): right great toe  DO NOT USE:  Present on Admission:   Dressing Type None 06/22/24 0635     Pressure Injury 03/06/24 Hip Anterior;Left;Proximal Unstageable - Full thickness tissue loss in which the base of the injury is covered by slough (yellow, tan, gray, green or brown) and/or eschar (tan, brown or black) in the wound bed. unstageable (Active)  03/06/24 1612  Location: Hip  Location Orientation: Anterior;Left;Proximal  Staging: Unstageable - Full thickness tissue loss in which the base of the injury is covered by slough (yellow, tan, gray, green or brown) and/or eschar (tan, brown or black) in the wound bed.  Wound Description (Comments): unstageable  DO NOT USE:  Present on Admission: No  Dressing Type Foam - Lift dressing to assess site every shift 06/22/24 0635     Pressure Injury 03/06/24 Shoulder Left Unstageable - Full thickness tissue loss in which the base of the injury is covered by slough (yellow, tan, gray, green or brown) and/or eschar (tan, brown or black) in the wound bed. unstageable (Active)  03/06/24 1613  Location: Shoulder  Location Orientation: Left  Staging: Unstageable - Full thickness tissue loss in which the base of the injury is covered by slough (yellow, tan, gray, green or brown) and/or eschar (tan, brown or black) in the wound bed.  Wound Description (Comments): unstageable  DO NOT USE:  Present on Admission: No  Dressing  Type None 06/22/24 0635     Pressure Injury 04/23/24 Heel Right Unstageable - Full thickness tissue loss in which the base of the injury is covered by slough (yellow, tan, gray, green or brown) and/or eschar (tan, brown or black) in the wound bed. DTPI evolving into unstageable (Active)  04/23/24   Location: Heel  Location Orientation: Right  Staging: Unstageable - Full thickness tissue loss in which the base of the injury is covered by slough (yellow, tan, gray, green or brown) and/or eschar (tan, brown or black) in the wound bed.  Wound Description (Comments): DTPI evolving into unstageable  DO NOT USE:  Present on Admission: No  Dressing Type Foam - Lift dressing to assess site every shift 06/22/24 0635     Pressure Injury 04/23/24 Heel Left Deep Tissue Pressure Injury - Purple or maroon localized area of discolored intact skin or blood-filled blister due to damage of underlying soft tissue from pressure and/or shear. (Active)  04/23/24   Location: Heel  Location Orientation: Left  Staging: Deep Tissue Pressure Injury - Purple or maroon localized area of discolored intact skin or blood-filled blister due to damage of underlying soft tissue from pressure and/or shear.  Wound Description (Comments):   DO NOT USE:  Present on Admission: No  Dressing Type Foam - Lift dressing to assess site every shift 06/22/24 0635    DVT prophylaxis:SCDs Start: 03/04/24 2151 Place TED hose Start: 03/04/24 2151 apixaban  (ELIQUIS ) tablet 5 mg     Code Status: Limited: Do not attempt resuscitation (DNR) -DNR-LIMITED -Do Not Intubate/DNI   Family Communication: Daughter  Patient status:Inpatient  Patient is from :home  Anticipated discharge to:not sure  Estimated DC date:not sure   Consultants: Cardiology, general surgery, PCCM  Procedures: Wound excision and debridement, thoracentesis  Antimicrobials:  Anti-infectives (From admission, onward)    Start     Dose/Rate Route Frequency Ordered  Stop   04/27/24 1615  cefTAZidime  (FORTAZ ) 2 g in sodium chloride  0.9 % 100 mL IVPB        2 g 200 mL/hr over 30 Minutes Intravenous Every 8 hours 04/27/24 1518 05/03/24 2359   03/16/24 1015  cefTRIAXone  (ROCEPHIN ) 2 g in sodium chloride  0.9 % 100 mL IVPB        2 g 200 mL/hr over 30 Minutes Intravenous Every 24 hours 03/16/24 0929 03/20/24 1219   03/16/24 1015  doxycycline  (VIBRA -TABS) tablet 100 mg        100 mg Oral Every 12 hours 03/16/24 0929 03/20/24 2131   03/14/24 1000  metroNIDAZOLE  (FLAGYL ) tablet 500 mg        500 mg Oral Every 12 hours 03/14/24 0803 03/19/24 2219   03/13/24 1000  cefadroxil  (DURICEF) capsule 1,000 mg  Status:  Discontinued        1,000 mg Oral 2 times daily 03/12/24 1632 03/16/24 0929   03/07/24 2200  metroNIDAZOLE  (FLAGYL ) tablet 500 mg        500 mg Oral Every 12 hours 03/07/24 0840 03/11/24 2258   03/05/24 1930  vancomycin  (VANCOREADY) IVPB 1250 mg/250 mL  Status:  Discontinued        1,250 mg 166.7 mL/hr over 90 Minutes Intravenous Every 24 hours 03/04/24 2205 03/04/24 2224   03/05/24 1930  Vancomycin  (VANCOCIN ) 1,250 mg in sodium chloride  0.9 % 250 mL IVPB  Status:  Discontinued        1,250 mg 166.7 mL/hr over 90 Minutes Intravenous  Every 24 hours 03/04/24 2224 03/05/24 0717   03/05/24 1930  vancomycin  (VANCOREADY) IVPB 1500 mg/300 mL  Status:  Discontinued        1,500 mg 150 mL/hr over 120 Minutes Intravenous Every 24 hours 03/05/24 0717 03/12/24 1636   03/05/24 1400  ceFEPIme  (MAXIPIME ) 2 g in sodium chloride  0.9 % 100 mL IVPB  Status:  Discontinued        2 g 200 mL/hr over 30 Minutes Intravenous Every 8 hours 03/05/24 0717 03/12/24 1632   03/05/24 0600  ceFEPIme  (MAXIPIME ) 2 g in sodium chloride  0.9 % 100 mL IVPB  Status:  Discontinued        2 g 200 mL/hr over 30 Minutes Intravenous Every 12 hours 03/04/24 2205 03/05/24 0717   03/05/24 0000  metroNIDAZOLE  (FLAGYL ) IVPB 500 mg  Status:  Discontinued        500 mg 100 mL/hr over 60 Minutes  Intravenous Every 12 hours 03/04/24 2206 03/04/24 2209   03/04/24 1730  aztreonam (AZACTAM) 2 g in sodium chloride  0.9 % 100 mL IVPB  Status:  Discontinued        2 g 200 mL/hr over 30 Minutes Intravenous  Once 03/04/24 1715 03/04/24 1724   03/04/24 1730  metroNIDAZOLE  (FLAGYL ) IVPB 500 mg  Status:  Discontinued        500 mg 100 mL/hr over 60 Minutes Intravenous Every 12 hours 03/04/24 1715 03/07/24 0840   03/04/24 1730  vancomycin  (VANCOREADY) IVPB 1500 mg/300 mL        1,500 mg 150 mL/hr over 120 Minutes Intravenous  Once 03/04/24 1715 03/04/24 2153   03/04/24 1730  ceFEPIme  (MAXIPIME ) 2 g in sodium chloride  0.9 % 100 mL IVPB        2 g 200 mL/hr over 30 Minutes Intravenous  Once 03/04/24 1724 03/04/24 1826       Subjective: Patient seen and examined at bedside today.  Overall comfortable, on room air.  Does not complain of shortness of breath or cough.  Chronically deconditioned, weak, mostly bedbound  Objective: Vitals:   06/23/24 0548 06/23/24 0847 06/23/24 1150 06/23/24 1648  BP: 110/70 133/88 118/78 114/75  Pulse: 81 76 73 61  Resp: 16     Temp: 97.6 F (36.4 C) 98.3 F (36.8 C) 98.3 F (36.8 C) 98.1 F (36.7 C)  TempSrc: Oral     SpO2: 100% 93% 100% 98%  Weight:      Height:        Intake/Output Summary (Last 24 hours) at 06/23/2024 1820 Last data filed at 06/23/2024 1039 Gross per 24 hour  Intake 410 ml  Output 1050 ml  Net -640 ml   Filed Weights   06/11/24 0100 06/12/24 0601 06/13/24 0128  Weight: 63 kg 63.5 kg 63.5 kg    Examination:   General exam: Overall comfortable, not in distress, chronically deconditioned, chronically ill looking HEENT: PERRL Respiratory system:  no wheezes or crackles .  Diminished sounds in bases Cardiovascular system: S1 & S2 heard, RRR.  Gastrointestinal system: Abdomen is nondistended, soft and nontender. Central nervous system: Alert and oriented, residual right-sided weakness Extremities: No edema, no clubbing ,no  cyanosis Skin: pressure ulcers     Data Reviewed: I have personally reviewed following labs and imaging studies  CBC: Recent Labs  Lab 06/21/24 0304  WBC 5.4  HGB 12.5*  HCT 36.9*  MCV 85.6  PLT 306   Basic Metabolic Panel: Recent Labs  Lab 06/21/24 0304  NA 139  K  4.9  CL 108  CO2 22  GLUCOSE 110*  BUN 29*  CREATININE 1.00  CALCIUM  8.8*     No results found for this or any previous visit (from the past 240 hours).    Radiology Studies: No results found.   Scheduled Meds:  (feeding supplement) PROSource Plus  30 mL Oral BID BM   acetaminophen   650 mg Oral Q8H   amiodarone   200 mg Oral Daily   apixaban   5 mg Oral BID   atorvastatin   40 mg Oral Daily   calcium  carbonate  1 tablet Oral TID   collagenase    Topical Daily   diclofenac  Sodium  4 g Topical QID   feeding supplement  237 mL Oral BID BM   ferrous sulfate   325 mg Oral QHS   furosemide   20 mg Oral Daily   guaiFENesin   600 mg Oral BID   insulin  aspart  0-5 Units Subcutaneous QHS   insulin  aspart  0-9 Units Subcutaneous TID WC   insulin  glargine-yfgn  6 Units Subcutaneous Daily   levothyroxine   50 mcg Oral Q0600   lidocaine   1 patch Transdermal Q24H   liver oil-zinc  oxide   Topical BID   multivitamin with minerals  1 tablet Oral Daily   nutrition supplement (JUVEN)  1 packet Oral BID BM   mouth rinse  15 mL Mouth Rinse 4 times per day   pantoprazole   40 mg Oral Daily   polyethylene glycol  17 g Oral BID   potassium chloride   20 mEq Oral Daily   senna-docusate  1 tablet Oral QHS   sertraline   50 mg Oral Daily   sodium chloride  flush  10-40 mL Intracatheter Q12H   Tafamidis   1 capsule Oral Daily   vutrisiran  sodium  25 mg Subcutaneous Q90 days   Continuous Infusions:  dextrose        LOS: 111 days   Leatrice LILLETTE Chapel, MD [facility] P8/01/2024, 6:20 PM

## 2024-06-23 NOTE — Plan of Care (Signed)
  Problem: Fluid Volume: Goal: Hemodynamic stability will improve Outcome: Progressing   Problem: Clinical Measurements: Goal: Diagnostic test results will improve Outcome: Progressing Goal: Signs and symptoms of infection will decrease Outcome: Progressing   Problem: Education: Goal: Knowledge of risk factors and measures for prevention of condition will improve Outcome: Progressing   Problem: Coping: Goal: Psychosocial and spiritual needs will be supported Outcome: Progressing   Problem: Respiratory: Goal: Will maintain a patent airway Outcome: Progressing Goal: Complications related to the disease process, condition or treatment will be avoided or minimized Outcome: Progressing   Problem: Education: Goal: Ability to describe self-care measures that may prevent or decrease complications (Diabetes Survival Skills Education) will improve Outcome: Progressing   Problem: Coping: Goal: Ability to adjust to condition or change in health will improve Outcome: Progressing   Problem: Fluid Volume: Goal: Ability to maintain a balanced intake and output will improve Outcome: Progressing   Problem: Health Behavior/Discharge Planning: Goal: Ability to manage health-related needs will improve Outcome: Progressing   Problem: Metabolic: Goal: Ability to maintain appropriate glucose levels will improve Outcome: Progressing   Problem: Nutritional: Goal: Maintenance of adequate nutrition will improve Outcome: Progressing Goal: Progress toward achieving an optimal weight will improve Outcome: Progressing   Problem: Skin Integrity: Goal: Risk for impaired skin integrity will decrease Outcome: Progressing   Problem: Tissue Perfusion: Goal: Adequacy of tissue perfusion will improve Outcome: Progressing   Problem: Education: Goal: Knowledge of General Education information will improve Description: Including pain rating scale, medication(s)/side effects and non-pharmacologic  comfort measures Outcome: Progressing   Problem: Health Behavior/Discharge Planning: Goal: Ability to manage health-related needs will improve Outcome: Progressing   Problem: Clinical Measurements: Goal: Ability to maintain clinical measurements within normal limits will improve Outcome: Progressing Goal: Will remain free from infection Outcome: Progressing Goal: Diagnostic test results will improve Outcome: Progressing Goal: Respiratory complications will improve Outcome: Progressing Goal: Cardiovascular complication will be avoided Outcome: Progressing   Problem: Activity: Goal: Risk for activity intolerance will decrease Outcome: Progressing   Problem: Nutrition: Goal: Adequate nutrition will be maintained Outcome: Progressing   Problem: Coping: Goal: Level of anxiety will decrease Outcome: Progressing   Problem: Elimination: Goal: Will not experience complications related to bowel motility Outcome: Progressing Goal: Will not experience complications related to urinary retention Outcome: Progressing   Problem: Pain Managment: Goal: General experience of comfort will improve and/or be controlled Outcome: Progressing   Problem: Safety: Goal: Ability to remain free from injury will improve Outcome: Progressing   Problem: Skin Integrity: Goal: Risk for impaired skin integrity will decrease Outcome: Progressing

## 2024-06-23 NOTE — Progress Notes (Signed)
 9344 Wounds cleaned & dressed as prescribed. Left AO x 4 in bed. No distress noted/reported at this time.

## 2024-06-24 DIAGNOSIS — I480 Paroxysmal atrial fibrillation: Secondary | ICD-10-CM | POA: Diagnosis not present

## 2024-06-24 DIAGNOSIS — L98429 Non-pressure chronic ulcer of back with unspecified severity: Secondary | ICD-10-CM | POA: Diagnosis not present

## 2024-06-24 DIAGNOSIS — Z8673 Personal history of transient ischemic attack (TIA), and cerebral infarction without residual deficits: Secondary | ICD-10-CM | POA: Diagnosis not present

## 2024-06-24 DIAGNOSIS — A419 Sepsis, unspecified organism: Secondary | ICD-10-CM | POA: Diagnosis not present

## 2024-06-24 LAB — GLUCOSE, CAPILLARY
Glucose-Capillary: 183 mg/dL — ABNORMAL HIGH (ref 70–99)
Glucose-Capillary: 211 mg/dL — ABNORMAL HIGH (ref 70–99)
Glucose-Capillary: 226 mg/dL — ABNORMAL HIGH (ref 70–99)
Glucose-Capillary: 194 mg/dL — ABNORMAL HIGH (ref 70–99)

## 2024-06-24 MED ORDER — INSULIN GLARGINE-YFGN 100 UNIT/ML ~~LOC~~ SOLN
10.0000 [IU] | Freq: Every day | SUBCUTANEOUS | Status: DC
  Administered 2024-06-25 – 2024-06-27 (×3): 10 [IU] via SUBCUTANEOUS
  Filled 2024-06-24 (×3): qty 0.1

## 2024-06-24 MED ORDER — VITAMIN A 3 MG (10000 UNIT) PO CAPS
10000.0000 [IU] | ORAL_CAPSULE | Freq: Every day | ORAL | Status: DC
  Administered 2024-06-24 – 2024-08-21 (×61): 10000 [IU] via ORAL
  Filled 2024-06-24 (×61): qty 1

## 2024-06-24 MED ORDER — INSULIN ASPART 100 UNIT/ML IJ SOLN
0.0000 [IU] | Freq: Three times a day (TID) | INTRAMUSCULAR | Status: DC
  Administered 2024-06-24: 5 [IU] via SUBCUTANEOUS
  Administered 2024-06-25 (×2): 3 [IU] via SUBCUTANEOUS
  Administered 2024-06-25: 2 [IU] via SUBCUTANEOUS
  Administered 2024-06-27: 3 [IU] via SUBCUTANEOUS

## 2024-06-24 NOTE — Progress Notes (Signed)
 PROGRESS NOTE  Thorn Asselin FMW:[id number] DOB: 04/27/1941   PCP: [name], MD  Patient is from: SNF  DOA: 03/04/2024 LOS: 112  Chief complaints No chief complaint on file.    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  Hospital course was remarkable for acute metabolic encephalopathy, hypotension, hypoglycemia.  Found to have worsening sacral ulcers.  General surgery consulted.Status post bedside excisional debridement of the sacral wound on 4/15 and 4/17.  Prolonged hospitalization due to reluctance of family to take him home because of pressure ulcers.They are anticipating the ulcers to heal before they can take him home.TOC assisting with disposition   Subjective: Seen and examined earlier this morning.  No major events overnight or this morning.  Reports headache.  Difficult to understand his speech.  Objective: Vitals:   06/24/24 0005 06/24/24 0552 06/24/24 0733 06/24/24 1233  BP: 115/66 121/86 120/71 123/75  Pulse: 77 90 81 75  Resp: 18  18 18   Temp: 97.8 F (36.6 C)  (!) 97.5 F (36.4 C) 97.6 F (36.4 C)  TempSrc: Oral     SpO2:  96% 98% 100%  Weight:      Height:        Examination:  GENERAL: No apparent distress.  Nontoxic. HEENT: MMM.  Vision and hearing grossly intact.  NECK: Supple.  No apparent JVD.  RESP: Some work of breathing as he speaks. CVS:  RRR. Heart sounds normal.  ABD/GI/GU: BS+. Abd soft, NTND.  MSK/EXT:  Moves extremities. No apparent deformity. No edema.  SKIN: Sacral decubitus.  Pressure ulcer over left hip.  Seems to be NEURO: AA.  Seems to be oriented.  Difficult to understand speech  partly due to his work of breathing as he speaks.  BLE weakness. PSYCH: Calm. Normal affect.    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  Hypotensive, encephalopathic, hypoglycemic, elevated lactate, leukocytosis on presentation.   -CT showed sacral wound extension to the right gluteus muscle without signs of osteomyelitis. -Underwent bedside excisional debridement on 4/15 and 4/7.   -Completed antibiotic course per recommendation by ID.   -CT on 6/16 concerning for possible osteomyelitis.  -Plastic surgery recommended for continued wound care and no surgical management. -CT on 7/13 showed similar appearance of osteomyelitis underlying sacral decubitus ulcer.  -Continue wound care.  Optimize nutrition.     Paroxysmal A-fib with RVR:   Now rate controlled. 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 -Continue with  low dose lasix    Recurrent bilateral pleural effusion:  -Left-sided thoracentesis on 6/18.  Body fluid culture without any growth.   -Bilateral thoracentesis on 7/14 by pulmonology.  Fluid transudative and culture negative -CXR on 7/30 again showed bilateral pleural effusions,more on right. - Right thoracocentesis  with removal of 500 mL of pleural fluid.   -Remains on room air   IDDM-2 with hyperglycemia: A1c 8.4% on 4/3. Recent Labs  Lab 06/23/24 1148 06/23/24 1648 06/23/24 2018 06/24/24 0745 06/24/24 1236  GLUCAP 208* 158* 154* 183* 211*  - Increase SSI to moderate -Increase Semglee   from 6 to 10 units daily   History of CVA with right-sided residual weakness.   -On Eliquis , Lipitor.    GERD epigastric pain: -Continue PPI   Anemia of chronic disease: Continue iron  supplementation   Hypothyroidism: Continue levothyroxine    Constipation: Continue bowel regimen      Goals of care: Very deconditioned elderly male with bedbound status, multiple comorbidities, sacral pressure ulcer.  CODE STATUS DNR.  Palliative care consulted for goals of care.   PT/OT consulted.  Recommended SNF on discharge.  TOC following for placement  Severe malnutrition Body mass index is 19.52 kg/m. Nutrition Problem: Severe Malnutrition Etiology: chronic illness Signs/Symptoms: energy intake < or equal to 75% for > or equal to 1 month, severe muscle depletion Interventions: Ensure Enlive (each supplement provides 350kcal and 20 grams of protein), Magic cup, Hormel Shake, MVI, Juven, Other (Comment) (vitamin C )  Wound type: Unstageable Pressure Injury: left shoulder Unstageable Pressure Injury: left trochanter Unstageable Pressure Injury; right heel Stage 4 Pressure Injury; sacrum Pressure Injury 02/26/24 Coccyx Mid Stage 4 - Full thickness tissue loss with exposed bone, tendon or muscle. (Active)  02/26/24 0916  Location: Coccyx  Location Orientation: Mid  Staging: Stage 4 - Full thickness tissue loss with exposed bone, tendon or muscle.  Wound Description (Comments):   DO NOT USE:  Present on Admission: Yes  Dressing Type Foam - Lift dressing to assess site every shift 06/24/24 0951     Pressure Injury 02/26/24 Toe (Comment  which one) Anterior;Right Deep Tissue Pressure Injury - Purple or maroon localized area of discolored intact skin or blood-filled blister due to damage of underlying soft tissue from pressure and/or shear. right gre (Active)  02/26/24 1215  Location: Toe (Comment  which one)  Location Orientation: Anterior;Right  Staging: Deep Tissue Pressure Injury - Purple or maroon localized area of discolored intact skin or blood-filled blister due to damage of underlying soft tissue from pressure and/or shear.  Wound Description (Comments): right great toe   DO NOT USE:  Present on Admission:   Dressing Type None 06/22/24 0635     Pressure Injury 03/06/24 Hip Anterior;Left;Proximal Unstageable - Full thickness tissue loss in which the base of the injury is covered by slough (yellow, tan, gray, green or brown) and/or eschar (tan, brown or black) in the wound bed. unstageable (Active)  03/06/24 1612  Location: Hip  Location Orientation: Anterior;Left;Proximal  Staging: Unstageable - Full thickness tissue loss in which the base of the injury is covered by slough (yellow, tan, gray, green or brown) and/or eschar (tan, brown or black) in the wound bed.  Wound Description (Comments): unstageable  DO NOT USE:  Present on Admission: No  Dressing Type Foam - Lift dressing to assess site every shift 06/24/24 0951     Pressure Injury 03/06/24 Shoulder Left Unstageable - Full thickness tissue loss in which the base of the injury is covered by slough (yellow, tan, gray, green or brown) and/or eschar (tan, brown or black) in the wound bed. unstageable (Active)  03/06/24 1613  Location: Shoulder  Location Orientation: Left  Staging: Unstageable - Full thickness tissue loss in which the base of the injury is covered by slough (yellow, tan, gray, green or brown) and/or eschar (tan, brown or black) in the wound bed.  Wound Description (Comments): unstageable  DO NOT USE:  Present on Admission: No  Dressing Type Foam - Lift dressing to assess site every shift 06/24/24 0951     Pressure Injury 04/23/24 Heel Right  Unstageable - Full thickness tissue loss in which the base of the injury is covered by slough (yellow, tan, gray, green or brown) and/or eschar (tan, brown or black) in the wound bed. DTPI evolving into unstageable (Active)  04/23/24   Location: Heel  Location Orientation: Right  Staging: Unstageable - Full thickness tissue loss in which the base of the injury is covered by slough (yellow, tan, gray, green or brown) and/or eschar (tan, brown or black) in  the wound bed.  Wound Description (Comments): DTPI evolving into unstageable  DO NOT USE:  Present on Admission: No  Dressing Type None (Betadine, open to air) 06/23/24 2024     Pressure Injury 04/23/24 Heel Left Deep Tissue Pressure Injury - Purple or maroon localized area of discolored intact skin or blood-filled blister due to damage of underlying soft tissue from pressure and/or shear. (Active)  04/23/24   Location: Heel  Location Orientation: Left  Staging: Deep Tissue Pressure Injury - Purple or maroon localized area of discolored intact skin or blood-filled blister due to damage of underlying soft tissue from pressure and/or shear.  Wound Description (Comments):   DO NOT USE:  Present on Admission: No  Dressing Type Foam - Lift dressing to assess site every shift 06/23/24 2024   DVT prophylaxis:  SCDs Start: 03/04/24 2151 Place TED hose Start: 03/04/24 2151 apixaban  (ELIQUIS ) tablet 5 mg  Code Status: DNR Family Communication: None at bedside Level of care: Telemetry Medical Status is: Inpatient Remains inpatient appropriate because: Disposition   Final disposition: Home?   35 minutes with more than 50% spent in reviewing records, counseling patient/family and coordinating care.   Sch Meds:  Scheduled Meds:  (feeding supplement) PROSource Plus  30 mL Oral BID BM   acetaminophen   650 mg Oral Q8H   amiodarone   200 mg Oral Daily   apixaban   5 mg Oral BID   atorvastatin   40 mg Oral Daily   calcium  carbonate  1 tablet Oral TID   collagenase    Topical Daily   diclofenac  Sodium  4 g Topical QID   feeding supplement  237 mL Oral BID BM   ferrous sulfate   325 mg Oral QHS   furosemide   20 mg Oral Daily   guaiFENesin   600 mg Oral BID   insulin  aspart  0-15 Units Subcutaneous TID WC   insulin  aspart  0-5 Units Subcutaneous QHS   [START ON 06/25/2024] insulin  glargine-yfgn  10 Units Subcutaneous Daily   levothyroxine   50 mcg Oral Q0600   lidocaine   1 patch Transdermal Q24H    liver oil-zinc  oxide   Topical BID   multivitamin with minerals  1 tablet Oral Daily   nutrition supplement (JUVEN)  1 packet Oral BID BM   mouth rinse  15 mL Mouth Rinse 4 times per day   pantoprazole   40 mg Oral Daily   polyethylene glycol  17 g Oral BID   potassium chloride   20 mEq Oral Daily   senna-docusate  1 tablet Oral QHS   sertraline   50 mg Oral Daily   sodium chloride  flush  10-40 mL Intracatheter Q12H   Tafamidis   1 capsule Oral Daily   vitamin A   10,000 Units Oral Daily   vutrisiran  sodium  25 mg Subcutaneous Q90 days   Continuous Infusions:  dextrose      PRN Meds:.bismuth  subsalicylate, butalbital -acetaminophen -caffeine , dextrose , food thickener, hydrOXYzine , lip balm, meclizine , ondansetron  **OR** ondansetron  (ZOFRAN ) IV, mouth rinse, oxyCODONE , artificial tears  Antimicrobials: Anti-infectives (From admission, onward)  Start     Dose/Rate Route Frequency Ordered Stop   04/27/24 1615  cefTAZidime  (FORTAZ ) 2 g in sodium chloride  0.9 % 100 mL IVPB        2 g 200 mL/hr over 30 Minutes Intravenous Every 8 hours 04/27/24 1518 05/03/24 2359   03/16/24 1015  cefTRIAXone  (ROCEPHIN ) 2 g in sodium chloride  0.9 % 100 mL IVPB        2 g 200 mL/hr over 30 Minutes Intravenous Every 24 hours 03/16/24 0929 03/20/24 1219   03/16/24 1015  doxycycline  (VIBRA -TABS) tablet 100 mg        100 mg Oral Every 12 hours 03/16/24 0929 03/20/24 2131   03/14/24 1000  metroNIDAZOLE  (FLAGYL ) tablet 500 mg        500 mg Oral Every 12 hours 03/14/24 0803 03/19/24 2219   03/13/24 1000  cefadroxil  (DURICEF) capsule 1,000 mg  Status:  Discontinued        1,000 mg Oral 2 times daily 03/12/24 1632 03/16/24 0929   03/07/24 2200  metroNIDAZOLE  (FLAGYL ) tablet 500 mg        500 mg Oral Every 12 hours 03/07/24 0840 03/11/24 2258   03/05/24 1930  vancomycin  (VANCOREADY) IVPB 1250 mg/250 mL  Status:  Discontinued        1,250 mg 166.7 mL/hr over 90 Minutes Intravenous Every 24 hours 03/04/24 2205 03/04/24  2224   03/05/24 1930  Vancomycin  (VANCOCIN ) 1,250 mg in sodium chloride  0.9 % 250 mL IVPB  Status:  Discontinued        1,250 mg 166.7 mL/hr over 90 Minutes Intravenous Every 24 hours 03/04/24 2224 03/05/24 0717   03/05/24 1930  vancomycin  (VANCOREADY) IVPB 1500 mg/300 mL  Status:  Discontinued        1,500 mg 150 mL/hr over 120 Minutes Intravenous Every 24 hours 03/05/24 0717 03/12/24 1636   03/05/24 1400  ceFEPIme  (MAXIPIME ) 2 g in sodium chloride  0.9 % 100 mL IVPB  Status:  Discontinued        2 g 200 mL/hr over 30 Minutes Intravenous Every 8 hours 03/05/24 0717 03/12/24 1632   03/05/24 0600  ceFEPIme  (MAXIPIME ) 2 g in sodium chloride  0.9 % 100 mL IVPB  Status:  Discontinued        2 g 200 mL/hr over 30 Minutes Intravenous Every 12 hours 03/04/24 2205 03/05/24 0717   03/05/24 0000  metroNIDAZOLE  (FLAGYL ) IVPB 500 mg  Status:  Discontinued        500 mg 100 mL/hr over 60 Minutes Intravenous Every 12 hours 03/04/24 2206 03/04/24 2209   03/04/24 1730  aztreonam (AZACTAM) 2 g in sodium chloride  0.9 % 100 mL IVPB  Status:  Discontinued        2 g 200 mL/hr over 30 Minutes Intravenous  Once 03/04/24 1715 03/04/24 1724   03/04/24 1730  metroNIDAZOLE  (FLAGYL ) IVPB 500 mg  Status:  Discontinued        500 mg 100 mL/hr over 60 Minutes Intravenous Every 12 hours 03/04/24 1715 03/07/24 0840   03/04/24 1730  vancomycin  (VANCOREADY) IVPB 1500 mg/300 mL        1,500 mg 150 mL/hr over 120 Minutes Intravenous  Once 03/04/24 1715 03/04/24 2153   03/04/24 1730  ceFEPIme  (MAXIPIME ) 2 g in sodium chloride  0.9 % 100 mL IVPB        2 g 200 mL/hr over 30 Minutes Intravenous  Once 03/04/24 1724 03/04/24 1826        I have personally reviewed the  following labs and images: CBC: Recent Labs  Lab 06/21/24 0304  WBC 5.4  HGB 12.5*  HCT 36.9*  MCV 85.6  PLT 306   BMP &GFR Recent Labs  Lab 06/21/24 0304  NA 139  K 4.9  CL 108  CO2 22  GLUCOSE 110*  BUN 29*  CREATININE 1.00  CALCIUM  8.8*    Estimated Creatinine Clearance: 50.3 mL/min (by C-G formula based on SCr of 1 mg/dL). Liver & Pancreas: No results for input(s): AST, ALT, ALKPHOS, BILITOT, PROT, ALBUMIN  in the last 168 hours. No results for input(s): LIPASE, AMYLASE in the last 168 hours. No results for input(s): AMMONIA in the last 168 hours. Diabetic: No results for input(s): HGBA1C in the last 72 hours. Recent Labs  Lab 06/23/24 1148 06/23/24 1648 06/23/24 2018 06/24/24 0745 06/24/24 1236  GLUCAP 208* 158* 154* 183* 211*   Cardiac Enzymes: No results for input(s): CKTOTAL, CKMB, CKMBINDEX, TROPONINI in the last 168 hours. No results for input(s): PROBNP in the last 8760 hours. Coagulation Profile: No results for input(s): INR, PROTIME in the last 168 hours. Thyroid  Function Tests: No results for input(s): TSH, T4TOTAL, FREET4, T3FREE, THYROIDAB in the last 72 hours. Lipid Profile: No results for input(s): CHOL, HDL, LDLCALC, TRIG, CHOLHDL, LDLDIRECT in the last 72 hours. Anemia Panel: No results for input(s): VITAMINB12, FOLATE, FERRITIN, TIBC, IRON, RETICCTPCT in the last 72 hours. Urine analysis:    Component Value Date/Time   COLORURINE STRAW (A) 03/04/2024 2139   APPEARANCEUR CLEAR 03/04/2024 2139   LABSPEC 1.016 03/04/2024 2139   PHURINE 6.0 03/04/2024 2139   GLUCOSEU >=500 (A) 03/04/2024 2139   HGBUR SMALL (A) 03/04/2024 2139   BILIRUBINUR NEGATIVE 03/04/2024 2139   KETONESUR NEGATIVE 03/04/2024 2139   PROTEINUR 30 (A) 03/04/2024 2139   UROBILINOGEN 0.2 05/05/2015 1443   NITRITE NEGATIVE 03/04/2024 2139   LEUKOCYTESUR NEGATIVE 03/04/2024 2139   Sepsis Labs: Invalid input(s): PROCALCITONIN, LACTICIDVEN  Microbiology: No results found for this or any previous visit (from the past 240 hours).  Radiology Studies: No results found.    Janele Lague T. Ilo Beamon [name]  If 7PM-7AM, please contact  night-coverage [email or web address] 06/24/2024, 4:13 PM

## 2024-06-24 NOTE — Plan of Care (Signed)
  Problem: Fluid Volume: Goal: Hemodynamic stability will improve Outcome: Progressing   Problem: Clinical Measurements: Goal: Diagnostic test results will improve Outcome: Progressing Goal: Signs and symptoms of infection will decrease Outcome: Progressing   Problem: Education: Goal: Knowledge of risk factors and measures for prevention of condition will improve Outcome: Progressing   Problem: Coping: Goal: Psychosocial and spiritual needs will be supported Outcome: Progressing   Problem: Respiratory: Goal: Will maintain a patent airway Outcome: Progressing Goal: Complications related to the disease process, condition or treatment will be avoided or minimized Outcome: Progressing   Problem: Education: Goal: Ability to describe self-care measures that may prevent or decrease complications (Diabetes Survival Skills Education) will improve Outcome: Progressing   Problem: Coping: Goal: Ability to adjust to condition or change in health will improve Outcome: Progressing   Problem: Fluid Volume: Goal: Ability to maintain a balanced intake and output will improve Outcome: Progressing   Problem: Health Behavior/Discharge Planning: Goal: Ability to manage health-related needs will improve Outcome: Progressing   Problem: Metabolic: Goal: Ability to maintain appropriate glucose levels will improve Outcome: Progressing   Problem: Nutritional: Goal: Maintenance of adequate nutrition will improve Outcome: Progressing Goal: Progress toward achieving an optimal weight will improve Outcome: Progressing   Problem: Skin Integrity: Goal: Risk for impaired skin integrity will decrease Outcome: Progressing   Problem: Tissue Perfusion: Goal: Adequacy of tissue perfusion will improve Outcome: Progressing   Problem: Education: Goal: Knowledge of General Education information will improve Description: Including pain rating scale, medication(s)/side effects and non-pharmacologic  comfort measures Outcome: Progressing   Problem: Health Behavior/Discharge Planning: Goal: Ability to manage health-related needs will improve Outcome: Progressing   Problem: Clinical Measurements: Goal: Ability to maintain clinical measurements within normal limits will improve Outcome: Progressing Goal: Will remain free from infection Outcome: Progressing Goal: Diagnostic test results will improve Outcome: Progressing Goal: Respiratory complications will improve Outcome: Progressing Goal: Cardiovascular complication will be avoided Outcome: Progressing   Problem: Activity: Goal: Risk for activity intolerance will decrease Outcome: Progressing   Problem: Nutrition: Goal: Adequate nutrition will be maintained Outcome: Progressing   Problem: Coping: Goal: Level of anxiety will decrease Outcome: Progressing   Problem: Elimination: Goal: Will not experience complications related to bowel motility Outcome: Progressing Goal: Will not experience complications related to urinary retention Outcome: Progressing   Problem: Pain Managment: Goal: General experience of comfort will improve and/or be controlled Outcome: Progressing   Problem: Safety: Goal: Ability to remain free from injury will improve Outcome: Progressing   Problem: Skin Integrity: Goal: Risk for impaired skin integrity will decrease Outcome: Progressing

## 2024-06-24 NOTE — Progress Notes (Signed)
 Speech Language Pathology Treatment: Cognitive-Linguistic  Patient Details Name: Earl [name] MRN: [id number] DOB: Dec 01, 1940 Today's Date: 06/24/2024 Time: 8759-8745 SLP Time Calculation (min) (ACUTE ONLY): 14 min  Assessment / Plan / Recommendation Clinical Impression  Pt seen for SLP session to address respiratory strengthening exercises in effort to improve vocal strength and clarity. Pt completed EMST x15 and IS x15-20 with min cues. IS volumes observed to be lower than his previous values today with a max of . Pt reported he is completing exercises independently. He does continue to get short of breath with little exertion. We discussed discontinuing SLP services at this time given he is able to complete his respiratory strengthening exercises independently.   SLP will discharge pt from current caseload. If pt's voice continues to remain dysphonic and weaker than baseline post d/c from hospital, consider follow up with ENT for laryngoscopy and referral to SLP who specializes in voice therapy.    HPI HPI: Patient is an 83 y.o. 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 He was discharged from the hospital (admitted for acute metabolic encephalopathy, hypotensive, hypoglycemic) to [facility] for re[facility]. He presented back to the hospital four days later on 03/04/24 with SOB and pain in sacral area. 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 SLP previous saw pt to assess and manage concerns for dysphagia. SLP re-consulted on 05/02/24 for voice assessment.      SLP Plan  Discharge SLP treatment due to (comment) (pt able to complete respiratory strengthening exercises independently)          Recommendations   Continue with respiratory  strengthening exercises at tolerated. Follow up with SLP at next level of care                  Oral care BID   Frequent or constant Supervision/Assistance Aphonia (R49.1)     Discharge SLP treatment due to (comment) (pt able to complete respiratory strengthening exercises independently)     Kennadie Brenner J Zenita Kister  06/24/2024, 1:10 PM

## 2024-06-24 NOTE — Progress Notes (Signed)
 Nutrition Follow-up  DOCUMENTATION CODES:   Severe malnutrition in context of chronic illness  INTERVENTION:  Continue dysphagia 1 diet as ordered Continue Juven BID to support ongoing wound healing needs Continue mixing 30 ml ProSource Plus BID with Juven, each supplement provides 100 kcals and 15 grams protein.  Continue Ensure Plus High Protein po from TID to BID, each supplement provides 350 kcal and 20 grams of protein. Check weights weekly Pt with continued deficiency of Vitamin A , re-ordered supplementation and will re-check Vit A/CRP x 14 days.   NUTRITION DIAGNOSIS:   Severe Malnutrition related to chronic illness as evidenced by energy intake < or equal to 75% for > or equal to 1 month, severe muscle depletion.  Ongoing  GOAL:   Patient will meet greater than or equal to 90% of their needs  Progressing  MONITOR:   PO intake, Supplement acceptance, Weight trends, Skin  REASON FOR ASSESSMENT:   Consult Assessment of nutrition requirement/status  ASSESSMENT:   83 year old male who presented to the ED on 03/04/24 from [facility] due to sacral pain. PMH of T2DM, HTN, HLD, CHF, atrial fibrillation, stroke with right-sided weakness, GERD, BPH, hypothyroidism, MGUS, transthyretin associated amyloid cardiomyopathy, chronic pancytopenia, recent hospital admission for COVID-19. Pt admitted with sepsis secondary to sacral pressure injury infection.  Spoke to pt at bedside. Pt states he isn't doing very well today. No noted n/v/c/d. Last BM 8/3. Documented average PO intake 56% x 4 days (5 meals doc). Pt states he is eating OK. Documented weight loss of 5 kg (7.3%) x ~4 months, however, pt has NOT had a weight since 7/24. Discussed with nursing and asked if they could get one today. Pt with mod-severe edema, which has increased today. Discussed pt with MD, states overall no new concerns. Recent thoracentesis, now increased edema; suspect fluid fluctuations impacting weight. Pt  denies additional questions/concerns at this time, will continue to monitor, RDN available prn.    Intake/Output Summary (Last 24 hours) at 06/24/2024 1250 Last data filed at 06/24/2024 9076 Gross per 24 hour  Intake 260 ml  Output 1000 ml  Net -740 ml    Labs BG 110-211 BUN 29 Calcium  8.8 ALK 127 Albumin  2.2 AST 67 ALT 77 Prealbumin <5 Vitamin A  6.9 L Vitamin B12 1939 H Vitamin C  2.3 H Zinc  54 WNL  Medication  (feeding supplement) PROSource Plus  30 mL Oral BID BM   acetaminophen   650 mg Oral Q8H   amiodarone   200 mg Oral Daily   apixaban   5 mg Oral BID   atorvastatin   40 mg Oral Daily   calcium  carbonate  1 tablet Oral TID   collagenase    Topical Daily   diclofenac  Sodium  4 g Topical QID   feeding supplement  237 mL Oral BID BM   ferrous sulfate   325 mg Oral QHS   furosemide   20 mg Oral Daily   guaiFENesin   600 mg Oral BID   insulin  aspart  0-5 Units Subcutaneous QHS   insulin  aspart  0-9 Units Subcutaneous TID WC   insulin  glargine-yfgn  6 Units Subcutaneous Daily   levothyroxine   50 mcg Oral Q0600   lidocaine   1 patch Transdermal Q24H   liver oil-zinc  oxide   Topical BID   multivitamin with minerals  1 tablet Oral Daily   nutrition supplement (JUVEN)  1 packet Oral BID BM   mouth rinse  15 mL Mouth Rinse 4 times per day   pantoprazole   40 mg Oral Daily  polyethylene glycol  17 g Oral BID   potassium chloride   20 mEq Oral Daily   senna-docusate  1 tablet Oral QHS   sertraline   50 mg Oral Daily   sodium chloride  flush  10-40 mL Intracatheter Q12H   Tafamidis   1 capsule Oral Daily   vitamin A   10,000 Units Oral Daily   vutrisiran  sodium  25 mg Subcutaneous Q90 days     NUTRITION - FOCUSED PHYSICAL EXAM:  Flowsheet Row Most Recent Value  Orbital Region Moderate depletion  Upper Arm Region Moderate depletion  Thoracic and Lumbar Region Mild depletion  Buccal Region Mild depletion  Temple Region Moderate depletion  Clavicle Bone Region Severe depletion   Clavicle and Acromion Bone Region Severe depletion  Scapular Bone Region Severe depletion  Dorsal Hand Mild depletion  Patellar Region Moderate depletion  Anterior Thigh Region Mild depletion  Posterior Calf Region Moderate depletion  Edema (RD Assessment) Mild  Hair Reviewed  Eyes Reviewed  Mouth Reviewed  Skin Reviewed  Nails Reviewed    Diet Order:   Diet Order             DIET - DYS 1 Room service appropriate? No; Fluid consistency: Thin  Diet effective now                   EDUCATION NEEDS:   No education needs have been identified at this time  Skin:  Skin Assessment: Skin Integrity Issues: Skin Integrity Issues:: Stage IV, Unstageable DTI: Heel Stage II: - Stage IV: Sacrum Unstageable: L hip, L shoulder, R heel, L medical foot Other: -  Last BM:  8/3 unspecified  Height:   Ht Readings from Last 1 Encounters:  03/04/24 5' 11 (1.803 m)    Weight:   Wt Readings from Last 1 Encounters:  06/13/24 63.5 kg    Ideal Body Weight:  78.2 kg  BMI:  Body mass index is 19.52 kg/m.  Estimated Nutritional Needs:   Kcal:  1900-2100  Protein:  100-115g  Fluid:  >/=1.9L   Seann Genther Daml-Budig, RDN, LDN Registered Dietitian Nutritionist RD Inpatient Contact Info in [place]

## 2024-06-24 NOTE — Plan of Care (Signed)
 Notified by patient's RN about patient's family likes to talk to the provider.  Called patient's room phone and talked to patient's daughter, Para who has multiple concerns including documentations, wound care, TOC...  She is unhappy about the bolded documentation of patient's prolonged hospitalization due to reluctance of family to take him home until his sacral ulcer improved.  She says she has talked to the administration about her dissatisfaction with the previous provider.  She says she has requested this removed from his documentation previously.  She also states that his sacral ulcer is due to cut he sustained in the hospital when he was hospitalized here with COVID, and she is unhappy that this is not clearly documented.  She is also not in agreement with wound care instruction given by wound care staff..  She wants dressing change every day instead of every 3 days.  She does not think the wound care team is giving the right instruction to help patient's wound.

## 2024-06-25 ENCOUNTER — Other Ambulatory Visit: Payer: Self-pay

## 2024-06-25 DIAGNOSIS — A419 Sepsis, unspecified organism: Secondary | ICD-10-CM | POA: Diagnosis not present

## 2024-06-25 DIAGNOSIS — L89154 Pressure ulcer of sacral region, stage 4: Secondary | ICD-10-CM | POA: Diagnosis not present

## 2024-06-25 DIAGNOSIS — I483 Typical atrial flutter: Secondary | ICD-10-CM | POA: Diagnosis not present

## 2024-06-25 DIAGNOSIS — Z8679 Personal history of other diseases of the circulatory system: Secondary | ICD-10-CM | POA: Diagnosis not present

## 2024-06-25 LAB — GLUCOSE, CAPILLARY
Glucose-Capillary: 128 mg/dL — ABNORMAL HIGH (ref 70–99)
Glucose-Capillary: 154 mg/dL — ABNORMAL HIGH (ref 70–99)
Glucose-Capillary: 176 mg/dL — ABNORMAL HIGH (ref 70–99)
Glucose-Capillary: 117 mg/dL — ABNORMAL HIGH (ref 70–99)

## 2024-06-25 NOTE — Progress Notes (Signed)
 Specialty Pharmacy Refill Coordination Note  Earl [name] is a 83 y.o. male contacted today regarding refills of specialty medication(s) Tafamidis    Patient requested Delivery   Delivery date: 06/27/24   Verified address: [address] [place] [address]   Medication will be filled on 06/26/24.

## 2024-06-25 NOTE — Progress Notes (Signed)
 Occupational Therapy Treatment Patient Details Name: Earl [name] MRN: [id number] DOB: 1941-05-02 Today's Date: 06/25/2024   History of present illness 83 y.o. male admitted from [facility] rehab on 03/04/24 with SOB, sacral pain, Afib with RVR. Pt with acute metabolic encephalopathy, rhabdomyolysis and sepsis secondary to sacral wound infection. 4/15 & 4/17 bedside sacral wound debridement. Pt also with B pleural effusion, 6/18 L thoracentesis, 7/14 L & R thoracentesis.  PMhx: adm 3/26-4/08/2024 with fall, AMS, COVID; d/c to SNF rehab. CVA (2021, residual R-side weakness), T2DM, HTN, CHF, MGUS, Afib on Eliquis .   OT comments  Patient received in supine with breakfast out of patient's reach. COTA offered to assist patient to recliner to address self feeding and patient declined and declined EOB. Patient was assisted with positioning in bed and HOB elevated to address self feeding. Patient required assistance with opening containers and setup to allow items to be within reach and red foam added to spoon. Patient was able to feed self following setup with minimum spillage using spoon.  Staff provided education on setup to allow patient to continue to address self feeding. Patient will benefit from continued inpatient follow up therapy, <3 hours/day.  Acute OT to continue to follow to address established goals to facilitate DC to next venue of care.        If plan is discharge home, recommend the following:  A lot of help with walking and/or transfers;A lot of help with bathing/dressing/bathroom;Assistance with cooking/housework;Assist for transportation;Help with stairs or ramp for entrance;Two people to help with walking and/or transfers;Assistance with feeding   Equipment Recommendations  Other (comment) (defer)    Recommendations for Other Services      Precautions / Restrictions Precautions Precautions: Fall Recall of Precautions/Restrictions: Impaired Precaution/Restrictions  Comments: sacral wound; L hip wound with wound vac, bilat heel wounds; L shoulder wound Restrictions Weight Bearing Restrictions Per Provider Order: No       Mobility Bed Mobility Overal bed mobility: Needs Assistance Bed Mobility: Rolling Rolling: Mod assist, Used rails         General bed mobility comments: rolling performed in bed to allow for positioning for self feeding    Transfers                   General transfer comment: patient declined     Balance                                           ADL either performed or assessed with clinical judgement   ADL Overall ADL's : Needs assistance/impaired Eating/Feeding: Set up;Minimal assistance;With adaptive utensils;Bed level Eating/Feeding Details (indicate cue type and reason): patient required assistance with opening containers and placing items within reach. Patient with minimum spillage using spoon. Discussed with staff to allow patient to feed self following setup Grooming: Wash/dry hands;Wash/dry face;Oral care;Set up;Bed level Grooming Details (indicate cue type and reason): performed in bed with HOB elevated                               General ADL Comments: declined OOB or EOB, performed self feeding with setup and min assist and caregiver education provided to staff    Extremity/Trunk Assessment              Vision       Perception  Praxis     Communication Communication Communication: Impaired Factors Affecting Communication: Reduced clarity of speech;Difficulty expressing self   Cognition Arousal: Alert Behavior During Therapy: WFL for tasks assessed/performed Cognition: No apparent impairments                               Following commands: Impaired Following commands impaired: Follows one step commands with increased time      Cueing   Cueing Techniques: Verbal cues, Tactile cues, Visual cues  Exercises      Shoulder  Instructions       General Comments      Pertinent Vitals/ Pain       Pain Assessment Pain Assessment: Faces Faces Pain Scale: Hurts a little bit Pain Location: sacrum Pain Descriptors / Indicators: Discomfort, Grimacing Pain Intervention(s): Limited activity within patient's tolerance, Monitored during session, Repositioned  Home Living                                          Prior Functioning/Environment              Frequency  Min 1X/week        Progress Toward Goals  OT Goals(current goals can now be found in the care plan section)  Progress towards OT goals: Progressing toward goals (with self feeding)  Acute Rehab OT Goals Patient Stated Goal: none stated OT Goal Formulation: With patient Time For Goal Achievement: 07/03/24 Potential to Achieve Goals: Fair ADL Goals Pt Will Perform Eating: with set-up;with supervision;with adaptive utensils;sitting Pt Will Perform Grooming: sitting;with supervision Pt Will Perform Upper Body Bathing: with min assist Pt Will Perform Upper Body Dressing: sitting;with min assist Pt Will Perform Lower Body Dressing: sit to/from stand;with contact guard assist Pt Will Transfer to Toilet: with contact guard assist;ambulating;regular height toilet Pt Will Perform Toileting - Clothing Manipulation and hygiene: sit to/from stand;with contact guard assist Pt/caregiver will Perform Home Exercise Program: Both right and left upper extremity;With minimal assist;Increased ROM;Increased strength;With written HEP provided Additional ADL Goal #1: Pt will perform bed mobility with mod A for repositioning in order to prevent skin breakdown Additional ADL Goal #2: Pt daughter will demonstrate independence with PROM HEP for BLE ROM  Plan      Co-evaluation                 AM-PAC OT 6 Clicks Daily Activity     Outcome Measure   Help from another person eating meals?: A Little Help from another person taking care  of personal grooming?: A Little Help from another person toileting, which includes using toliet, bedpan, or urinal?: Total Help from another person bathing (including washing, rinsing, drying)?: A Lot Help from another person to put on and taking off regular upper body clothing?: A Lot Help from another person to put on and taking off regular lower body clothing?: Total 6 Click Score: 12    End of Session    OT Visit Diagnosis: Other abnormalities of gait and mobility (R26.89);Muscle weakness (generalized) (M62.81);History of falling (Z91.81);Feeding difficulties (R63.3);Ataxia, unspecified (R27.0) Pain - part of body:  (sacral)   Activity Tolerance Patient tolerated treatment well   Patient Left in bed;with call bell/phone within reach   Nurse Communication Mobility status;Other (comment) (education on setup for self feeding)        Time: [phone] OT  Time Calculation (min): 32 min  Charges: OT General Charges $OT Visit: 1 Visit OT Treatments $Self Care/Home Management : 23-37 mins  Dick Laine, OTA Acute Rehabilitation Services  Office [phone]   Jeb LITTIE Laine 06/25/2024, 11:55 AM

## 2024-06-25 NOTE — TOC Progression Note (Signed)
 Transition of Care [facility]) - Progression Note    Patient Details  Name: Earl [name] MRN: [id number] Date of Birth: [birth year]  [facility] CM/SW Contact  Chima Astorino A [place], LCSW Phone Number: 06/25/2024, 10:01 AM  Clinical Narrative:     CSW spoke with pt's daughter Para if she had received email from DJ at [facility] regarding option for [facility] and providing information on facility. She stated that she had received email but did not want to consider them for placement stating there were more medical needs that needed to be addressed with pt and felt he should remain admitted here. CSW notified DJ at [facility] of information.   CSW will continue to follow.   Expected Discharge Plan: Skilled Nursing Facility Barriers to Discharge: Continued Medical Work up               Expected Discharge Plan and Services In-house Referral: Clinical Social Work     Living arrangements for the past 2 months:  (from [facility] short term rehab)                                       Social Drivers of Health (SDOH) Interventions SDOH Screenings   Food Insecurity: No Food Insecurity (03/05/2024)  Housing: Low Risk  (03/05/2024)  Transportation Needs: No Transportation Needs (03/05/2024)  Utilities: Not At Risk (03/05/2024)  Depression (PHQ2-9): Low Risk  (10/03/2023)  Financial Resource Strain: Patient Declined (01/16/2024)  Physical Activity: Unknown (01/16/2024)  Social Connections: Moderately Integrated (03/05/2024)  Stress: Stress Concern Present (01/16/2024)  Tobacco Use: Low Risk  (05/12/2024)    Readmission Risk Interventions    03/19/2024   10:24 AM  Readmission Risk Prevention Plan  Transportation Screening Complete  Medication Review (RN Care Manager) Complete  PCP or Specialist appointment within 3-5 days of discharge Complete  HRI or Home Care Consult Complete  SW Recovery Care/Counseling Consult Complete  Palliative Care Screening Complete  Skilled  Nursing Facility Complete

## 2024-06-25 NOTE — Consult Note (Addendum)
 WOC Nurse wound follow up Wound type: Unstageable pressure injury: left shoulder Unstageable Pressure Injury: left trochanter Unstageable Pressure Injury; right heel Stage 4 Pressure Injury; sacrum Open area to left medial foot : healed Measurement: Left shoulder; 3 cm x 2 cm  Left trochanter: 4 cm x  3 cm x 3 cm Right heel: 3 cm x 4 cm  Sacrum: 6 cm x 3 cm x 0.5 cm with undermining from 12 o'clock to 3 o'clock measuring 2 cm   Wound bed: Left shoulder: 100% covered by dry echar Left trochanter: 100% covered by yellow slough (adherent and boggy) Right heel: 100% covered by black eschar Sacrum: red, moist Drainage (amount, consistency, odor)  Left shoulder: none Left trochanter: blue green moderate drainage Right heel: none Sacrum: serosanguinous moderate amount Periwound: Intact Dressing procedure/placement/frequency: 1- Left shoulder Unstageable   Paint with Betadine and leave open to air, daily. 2- Left trochanter: Cleanse with Vashe (lawson # [id number]) allow to aid dry.  Apply 1/4 thick layer of Santyl  to wound bed, top with saline moist gauze. Top with silicone foam.  Change daily.     3 - Sacral Stage 4 Pressure Injury  Cleanse with Vashe#[id number]. Apply Aquacel Ag+ (silver hydrofiber) Lawson # [phone] in the wound bed and fill the undermining using a swab, cover with sacral foam dressing, change daily and PRN soilage  3- Right heel; Unstageable; paint with betadine and leave open to air; offload with pillows or Prevalon boots at all times  Off load left heel with Prevalon boots at all times.  LALM in place for moisture management and pressure redistribution.   Spoke with daughter, Para Tolle, via telephone to discuss care plan.  Requested PT for wound care and outreached attending with request for surgery to evaluate left trochanter wound for debridement.   WOC Nurse team will follow with you and see patient within 10 days for wound assessments.  Please notify WOC nurses of  any acute changes in the wounds or any new areas of concern   Thank you,  Doyal Polite, RN, MSN, [facility] WOC Team

## 2024-06-25 NOTE — Plan of Care (Signed)
 Updated patient's daughter, Para over the phone about my assessment and plan including recommendation by general surgery today including hydrotherapy and possible surgical debridement after hydrotherapy.  We have also broached on final disposition.  Para states that patient was independent and ambulatory before his hospitalization and he sustained the wounds.  She wants the wounds to improve to stage II before thinking about discharging or she will file a lawsuit.  She states you can kill him by sending him to the nursing home.  She states nursing home and LTACH do not have the capability to provide the care he needs if he needs a special IV line, he develops sepsis....  She also asks patient's notes to be written to include that patient's pressure injury is hospital sustained and that he is inpatient appropriate.

## 2024-06-25 NOTE — Consult Note (Signed)
 Earl [name] March 16, 1941  [id number].    Requesting MD: Mignon Bump Chief Complaint/Reason for Consult: Sacral wound assessment   HPI: Earl [name] is a 83 y.o. male with PMHx significant for Afib on Eliquis , stroke, HTN, DM, hyperlipidemia, MGUS, GERD, BPH, hypothyroidism, HFrEF with EF of 40 to 45%, chronic pancytopenia who presented to the emergency department from SNF with complaint of shortness of breath, wound, pain in the sacral area for whom general surgery received consultation for a left trochanter decub ulcer with some purulent, malodorous drainage. Patient was admitted after presenting to [facility] ED from SNF with complaints of shortness of breath, wound, pain in the sacral area.    ROS: ROS As per HPI  Family History  Problem Relation Age of Onset   Sickle cell anemia Mother    Diabetes Father    Stomach cancer Father    Colon cancer Father    CAD Neg Hx    Esophageal cancer Neg Hx    Rectal cancer Neg Hx     Past Medical History:  Diagnosis Date   Adenomatous colon polyp    Allergy    Anal fissure    Arthritis    BPH (benign prostatic hyperplasia)    Diabetes mellitus (HCC)    GERD (gastroesophageal reflux disease)    Hyperlipidemia    Hypertension    Hypothyroidism    Other testicular hypofunction    Stroke [facility])     Past Surgical History:  Procedure Laterality Date   CIRCUMCISION N/A 06/11/2019   Procedure: CIRCUMCISION ADULT;  Surgeon: Nieves Cough, MD;  Location: [facility] ORS;  Service: Urology;  Laterality: N/A;   COLONOSCOPY     INGUINAL HERNIA REPAIR Bilateral    1968 and 2004   IR THORACENTESIS ASP PLEURAL SPACE W/IMG GUIDE  05/08/2024   IR THORACENTESIS ASP PLEURAL SPACE W/IMG GUIDE  06/19/2024   LOOP RECORDER INSERTION N/A 07/28/2017   Procedure: LOOP RECORDER INSERTION;  Surgeon: Fernande Elspeth BROCKS, MD;  Location: [facility] INVASIVE CV LAB;  Service: Cardiovascular;  Laterality: N/A;   RIGHT/LEFT HEART CATH AND CORONARY ANGIOGRAPHY N/A 10/07/2021    Procedure: RIGHT/LEFT HEART CATH AND CORONARY ANGIOGRAPHY;  Surgeon: Rolan Ezra RAMAN, MD;  Location: [facility] INVASIVE CV LAB;  Service: Cardiovascular;  Laterality: N/A;   TEE WITHOUT CARDIOVERSION N/A 07/28/2017   Procedure: TRANSESOPHAGEAL ECHOCARDIOGRAM (TEE);  Surgeon: Shlomo Wilbert SAUNDERS, MD;  Location: [facility] ENDOSCOPY;  Service: Cardiovascular;  Laterality: N/A;   THORACENTESIS Bilateral 06/03/2024   Procedure: MILANA;  Surgeon: Mannam, Praveen, MD;  Location: [facility] ENDOSCOPY;  Service: Cardiopulmonary;  Laterality: Bilateral;    Social History:  reports that he has never smoked. He has never used smokeless tobacco. He reports that he does not drink alcohol  and does not use drugs.  Allergies:  Allergies  Allergen Reactions   Penicillins Other (See Comments)    Did it involve swelling of the face/tongue/throat, SOB, or low BP? Unknown Did it involve sudden or severe rash/hives, skin peeling, or any reaction on the inside of your mouth or nose? Unknown Did you need to seek medical attention at a hospital or doctor's office? No When did it last happen?    Over 46 Years Ago   If all above answers are NO, may proceed with cephalosporin use.      Medications Prior to Admission  Medication Sig Dispense Refill   Amino Acids -Protein Hydrolys (PRO-STAT AWC) LIQD Take 30 mLs by mouth in the morning and at bedtime.     atorvastatin  (  LIPITOR) 40 MG tablet Take  1 tablet  Daily for Cholesterol                                                       /                                  TAKE                                                       BY                                           MOUTH 90 tablet 0   Cholecalciferol  (VITAMIN D  PO) Take 1 tablet by mouth daily.      Cyanocobalamin  (B-12) 50 MCG TABS Take 50 mcg by mouth daily.     ELIQUIS  5 MG TABS tablet TAKE 1 TABLET BY MOUTH TWICE  DAILY TO PREVENT BLOOD CLOTS 200 tablet 2   Emollient (AQUAPHOR ADV PROTECT HEALING) OINT Apply small amount to two  areas on buttocks daily to prevent breakdown 99 g 1   ferrous sulfate  325 (65 FE) MG tablet Take 325 mg by mouth at bedtime.     finasteride  (PROSCAR ) 5 MG tablet TAKE 1 TABLET BY MOUTH DAILY FOR PROSTATE 100 tablet 2   levothyroxine  (SYNTHROID ) 50 MCG tablet TAKE 1 TABLET BY MOUTH DAILY ON  AN EMPTY STOMACH WITH ONLY WATER FOR 30 MINUTES AND NO ANTACID,  MEDS, CALCIUM  OR MAGNESIUM  FOR 4 HOURS AND AVOID BIOTIN (Patient taking differently: Take 50 mcg by mouth daily before breakfast. ON AN EMPTY STOMACH WITH ONLY WATER FOR 30 MINUTES AND NO ANTACID,  MEDS, CALCIUM  OR MAGNESIUM  FOR 4 HOURS AND AVOID BIOTIN) 100 tablet 2   Magnesium  250 MG TABS Take 250 mg by mouth daily.      metFORMIN  (GLUCOPHAGE -XR) 500 MG 24 hr tablet TAKE 2 TABLETS BY MOUTH TWICE  DAILY WITH MEALS FOR DIABETES 400 tablet 2   metoprolol  succinate (TOPROL -XL) 25 MG 24 hr tablet Take 1 tablet (25 mg total) by mouth at bedtime. 30 tablet 0   Multiple Vitamin (MULTIVITAMIN) capsule Take 1 capsule by mouth daily.     Nutritional Supplements (BOOST GLUCOSE CONTROL) LIQD Take 237 mLs by mouth 2 (two) times daily after a meal.     potassium chloride  SA (KLOR-CON  M20) 20 MEQ tablet Take 1 tablet by mouth once daily 90 tablet 3   sacubitril -valsartan  (ENTRESTO ) 24-26 MG Take 1 tablet by mouth 2 (two) times daily. 180 tablet 3   vitamin C  (ASCORBIC ACID ) 500 MG tablet Take 500 mg by mouth daily.     AMVUTTRA  25 MG/0.5ML syringe INJECT 25MG  (0.5ML) SUBCUTANEOUSLY ONCE EVERY 3 MONTHS. ROTATE ADMINISTRATION SITE WITH EACH INJECTION. *REFRIGERATE. DO NOT SHAKE.* (Patient not taking: Reported on 03/04/2024) 0.5 mL 2   Blood Glucose Monitoring Suppl (ONE TOUCH ULTRA 2) w/Device KIT Check blood sugar 1 time a day-DX-E11.9 1 kit 0  glucose blood (ONETOUCH ULTRA) test strip Use as instructed 100 strip 3   Lancets (ONETOUCH DELICA PLUS LANCET30G) MISC CHECK BLOOD SUGAR ONCE A DAY. 100 each 3   Lancets Misc. (ONE TOUCH SURESOFT) MISC Check blood sugar 1  time a day-DX-E11.91 1 each 0   NONFORMULARY OR COMPOUNDED ITEM Please dispense a Rolling Walker.  Dx:R26.81 & G63 1 each 0   Tafamidis  (VYNDAMAX ) 61 MG CAPS Take 1 capsule by mouth daily. 30 capsule 11     Physical Exam: Blood pressure 117/78, pulse 78, temperature (!) 97.5 F (36.4 C), resp. rate 16, height 5' 11 (1.803 m), weight 63.5 kg, SpO2 100%. Physical Exam General: WD, chronically ill appearing male who is laying in bed in NAD MSK: sacral wound without necrotic eschar but has some purulent drainage, currently packed with WTD dressing seen in first picture below. No erythema, warmth or induration noted on exam for any of the wounds.           Results for orders placed or performed during the hospital encounter of 03/04/24 (from the past 48 hours)  Glucose, capillary     Status: Abnormal   Collection Time: 06/23/24  4:48 PM  Result Value Ref Range   Glucose-Capillary 158 (H) 70 - 99 mg/dL    Comment: Glucose reference range applies only to samples taken after fasting for at least 8 hours.  Glucose, capillary     Status: Abnormal   Collection Time: 06/23/24  8:18 PM  Result Value Ref Range   Glucose-Capillary 154 (H) 70 - 99 mg/dL    Comment: Glucose reference range applies only to samples taken after fasting for at least 8 hours.  Glucose, capillary     Status: Abnormal   Collection Time: 06/24/24  7:45 AM  Result Value Ref Range   Glucose-Capillary 183 (H) 70 - 99 mg/dL    Comment: Glucose reference range applies only to samples taken after fasting for at least 8 hours.  Glucose, capillary     Status: Abnormal   Collection Time: 06/24/24 12:36 PM  Result Value Ref Range   Glucose-Capillary 211 (H) 70 - 99 mg/dL    Comment: Glucose reference range applies only to samples taken after fasting for at least 8 hours.  Glucose, capillary     Status: Abnormal   Collection Time: 06/24/24  4:50 PM  Result Value Ref Range   Glucose-Capillary 226 (H) 70 - 99 mg/dL     Comment: Glucose reference range applies only to samples taken after fasting for at least 8 hours.  Glucose, capillary     Status: Abnormal   Collection Time: 06/24/24  8:52 PM  Result Value Ref Range   Glucose-Capillary 194 (H) 70 - 99 mg/dL    Comment: Glucose reference range applies only to samples taken after fasting for at least 8 hours.  Glucose, capillary     Status: Abnormal   Collection Time: 06/25/24  8:26 AM  Result Value Ref Range   Glucose-Capillary 128 (H) 70 - 99 mg/dL    Comment: Glucose reference range applies only to samples taken after fasting for at least 8 hours.  Glucose, capillary     Status: Abnormal   Collection Time: 06/25/24 12:21 PM  Result Value Ref Range   Glucose-Capillary 154 (H) 70 - 99 mg/dL    Comment: Glucose reference range applies only to samples taken after fasting for at least 8 hours.   *Note: Due to a large number of results and/or encounters for  the requested time period, some results have not been displayed. A complete set of results can be found in Results Review.   No results found.  Anti-infectives (From admission, onward)    Start     Dose/Rate Route Frequency Ordered Stop   04/27/24 1615  cefTAZidime  (FORTAZ ) 2 g in sodium chloride  0.9 % 100 mL IVPB        2 g 200 mL/hr over 30 Minutes Intravenous Every 8 hours 04/27/24 1518 05/03/24 2359   03/16/24 1015  cefTRIAXone  (ROCEPHIN ) 2 g in sodium chloride  0.9 % 100 mL IVPB        2 g 200 mL/hr over 30 Minutes Intravenous Every 24 hours 03/16/24 0929 03/20/24 1219   03/16/24 1015  doxycycline  (VIBRA -TABS) tablet 100 mg        100 mg Oral Every 12 hours 03/16/24 0929 03/20/24 2131   03/14/24 1000  metroNIDAZOLE  (FLAGYL ) tablet 500 mg        500 mg Oral Every 12 hours 03/14/24 0803 03/19/24 2219   03/13/24 1000  cefadroxil  (DURICEF) capsule 1,000 mg  Status:  Discontinued        1,000 mg Oral 2 times daily 03/12/24 1632 03/16/24 0929   03/07/24 2200  metroNIDAZOLE  (FLAGYL ) tablet 500 mg         500 mg Oral Every 12 hours 03/07/24 0840 03/11/24 2258   03/05/24 1930  vancomycin  (VANCOREADY) IVPB 1250 mg/250 mL  Status:  Discontinued        1,250 mg 166.7 mL/hr over 90 Minutes Intravenous Every 24 hours 03/04/24 2205 03/04/24 2224   03/05/24 1930  Vancomycin  (VANCOCIN ) 1,250 mg in sodium chloride  0.9 % 250 mL IVPB  Status:  Discontinued        1,250 mg 166.7 mL/hr over 90 Minutes Intravenous Every 24 hours 03/04/24 2224 03/05/24 0717   03/05/24 1930  vancomycin  (VANCOREADY) IVPB 1500 mg/300 mL  Status:  Discontinued        1,500 mg 150 mL/hr over 120 Minutes Intravenous Every 24 hours 03/05/24 0717 03/12/24 1636   03/05/24 1400  ceFEPIme  (MAXIPIME ) 2 g in sodium chloride  0.9 % 100 mL IVPB  Status:  Discontinued        2 g 200 mL/hr over 30 Minutes Intravenous Every 8 hours 03/05/24 0717 03/12/24 1632   03/05/24 0600  ceFEPIme  (MAXIPIME ) 2 g in sodium chloride  0.9 % 100 mL IVPB  Status:  Discontinued        2 g 200 mL/hr over 30 Minutes Intravenous Every 12 hours 03/04/24 2205 03/05/24 0717   03/05/24 0000  metroNIDAZOLE  (FLAGYL ) IVPB 500 mg  Status:  Discontinued        500 mg 100 mL/hr over 60 Minutes Intravenous Every 12 hours 03/04/24 2206 03/04/24 2209   03/04/24 1730  aztreonam (AZACTAM) 2 g in sodium chloride  0.9 % 100 mL IVPB  Status:  Discontinued        2 g 200 mL/hr over 30 Minutes Intravenous  Once 03/04/24 1715 03/04/24 1724   03/04/24 1730  metroNIDAZOLE  (FLAGYL ) IVPB 500 mg  Status:  Discontinued        500 mg 100 mL/hr over 60 Minutes Intravenous Every 12 hours 03/04/24 1715 03/07/24 0840   03/04/24 1730  vancomycin  (VANCOREADY) IVPB 1500 mg/300 mL        1,500 mg 150 mL/hr over 120 Minutes Intravenous  Once 03/04/24 1715 03/04/24 2153   03/04/24 1730  ceFEPIme  (MAXIPIME ) 2 g in sodium chloride  0.9 % 100 mL  IVPB        2 g 200 mL/hr over 30 Minutes Intravenous  Once 03/04/24 1724 03/04/24 1826       Assessment/Plan Left Trochanter Decubitus ulcer, Stage  4  - Recommend BID WTD dressing changes for Left trochanter decub ulcer  - Recommend PT hydrotherapy and santyl  for shoulder/heel wounds  - Offload pressure as able  - WBC normal per CBC on 8/1, Repeat CBC  - wound overall stable with some purulent, malodorous drainage - no plans for surgical debridement at this time. Will discuss further with my attending.  -Patient currently a-febrile, HDS and in NAD.  FEN - D1 diet VTE - SCDs, Eliquis  ID - Not indicated at this time.   --PMHx: -DM- On insulin  -BPH -Stroke -HTN -pAfib- on amiodarone , Eliquis     I reviewed nursing notes, hospitalist notes, last 24 h vitals and pain scores, last 48 h intake and output, last 24 h labs and trends, and last 24 h imaging results.   Eulah Hammonds, [facility] 06/25/2024, 12:36 PM Please see Amion for pager number during day hours 7:00am-4:30pm

## 2024-06-25 NOTE — Plan of Care (Signed)
  Problem: Fluid Volume: Goal: Hemodynamic stability will improve Outcome: Progressing   Problem: Clinical Measurements: Goal: Diagnostic test results will improve Outcome: Progressing Goal: Signs and symptoms of infection will decrease Outcome: Progressing   Problem: Education: Goal: Knowledge of risk factors and measures for prevention of condition will improve Outcome: Progressing   Problem: Coping: Goal: Psychosocial and spiritual needs will be supported Outcome: Progressing   Problem: Respiratory: Goal: Will maintain a patent airway Outcome: Progressing Goal: Complications related to the disease process, condition or treatment will be avoided or minimized Outcome: Progressing   Problem: Education: Goal: Ability to describe self-care measures that may prevent or decrease complications (Diabetes Survival Skills Education) will improve Outcome: Progressing   Problem: Coping: Goal: Ability to adjust to condition or change in health will improve Outcome: Progressing   Problem: Fluid Volume: Goal: Ability to maintain a balanced intake and output will improve Outcome: Progressing   Problem: Health Behavior/Discharge Planning: Goal: Ability to manage health-related needs will improve Outcome: Progressing   Problem: Metabolic: Goal: Ability to maintain appropriate glucose levels will improve Outcome: Progressing   Problem: Nutritional: Goal: Maintenance of adequate nutrition will improve Outcome: Progressing Goal: Progress toward achieving an optimal weight will improve Outcome: Progressing   Problem: Skin Integrity: Goal: Risk for impaired skin integrity will decrease Outcome: Progressing   Problem: Tissue Perfusion: Goal: Adequacy of tissue perfusion will improve Outcome: Progressing   Problem: Education: Goal: Knowledge of General Education information will improve Description: Including pain rating scale, medication(s)/side effects and non-pharmacologic  comfort measures Outcome: Progressing   Problem: Health Behavior/Discharge Planning: Goal: Ability to manage health-related needs will improve Outcome: Progressing   Problem: Clinical Measurements: Goal: Ability to maintain clinical measurements within normal limits will improve Outcome: Progressing Goal: Will remain free from infection Outcome: Progressing Goal: Diagnostic test results will improve Outcome: Progressing Goal: Respiratory complications will improve Outcome: Progressing Goal: Cardiovascular complication will be avoided Outcome: Progressing   Problem: Activity: Goal: Risk for activity intolerance will decrease Outcome: Progressing   Problem: Nutrition: Goal: Adequate nutrition will be maintained Outcome: Progressing   Problem: Coping: Goal: Level of anxiety will decrease Outcome: Progressing   Problem: Elimination: Goal: Will not experience complications related to bowel motility Outcome: Progressing Goal: Will not experience complications related to urinary retention Outcome: Progressing   Problem: Pain Managment: Goal: General experience of comfort will improve and/or be controlled Outcome: Progressing   Problem: Safety: Goal: Ability to remain free from injury will improve Outcome: Progressing   Problem: Skin Integrity: Goal: Risk for impaired skin integrity will decrease Outcome: Progressing

## 2024-06-25 NOTE — Progress Notes (Signed)
 PROGRESS NOTE  Earl [name] FMW:[id number] DOB: Mar 14, 1941   PCP: [name], MD  Patient is from: SNF  DOA: 03/04/2024 LOS: 113  Chief complaints No chief complaint on file.    Brief Narrative / Interim history: 83 year old M with PMH of PAF on Eliquis , HFmrEF, amyloid cardiomyopathy, NIDDM-2, CVA with right-sided weakness, stage IV sacral wound, pancytopenia, MGUS, HTN, HLD and hypothyroidism brought to ED by EMS from [facility] rehab due to pain in sacral area and shortness of breath 4 days after he was discharged from the hospital.   Patient was hospitalized from 3/26-4/10 with generalized weakness, encephalopathy and COVID-19 infection.  Per daughter, patient sustained skin injury during this hospitalization, and he has had significant pain, discomfort and progressive foul-smelling discharge from sacral wound over the course of 4 to 5 days before coming back to ED.  No report of constitutional symptoms, chest pain or dyspnea.  In ED, hypotensive to 82/51.  In RVR to 140s.  Na 130.  Glucose elevated to 500.  Not in DKA or HHS.  WBC 13.6 with left shift.  CXR without acute finding.  CT abdomen and pelvis ordered.  Cultures obtained.  Admitted with working diagnosis of sepsis due to sacral pressure ulcer wound infection, A-fib with RVR, hypotension.  Cultures obtained.  Patient was started on broad-spectrum antibiotics and IV fluid.  PCCM, cardiology and general surgery consulted.  See plan and assessment for more on hospital course.  Subjective: Seen and examined earlier this morning.  No major events overnight or this morning.  Reports headache.  Difficult to understand his speech.  Objective: Vitals:   06/24/24 1233 06/24/24 1648 06/24/24 2039 06/25/24 0828  BP: 123/75 121/75 117/79 121/73  Pulse: 75 77 77 82  Resp: 18 18  16   Temp: 97.6 F (36.4 C) (!) 97.3 F (36.3 C) (!) 97.5 F (36.4 C) (!) 97.5 F (36.4 C)  TempSrc:   Oral   SpO2: 100% 100% 98% 99%  Weight:       Height:        Examination:  GENERAL: No apparent distress.  Nontoxic. HEENT: MMM.  Vision and hearing grossly intact.  NECK: Supple.  No apparent JVD.  RESP: Some work of breathing as he speaks. CVS:  RRR. Heart sounds normal.  ABD/GI/GU: BS+. Abd soft, NTND.  MSK/EXT:  Moves extremities. No apparent deformity. No edema.  SKIN: Sacral decubitus.  Pressure ulcer over left hip.  Seems to be NEURO: AA.  Seems to be oriented.  Difficult to understand speech partly due to his work of breathing as he speaks.  BLE weakness. PSYCH: Calm. Normal affect.   Consultants PCCM General Surgery Cardiology Infectious disease Plastic surgery Orthopedic surgery Palliative medicine  Microbiology 4/14-COVID-19 PCR positive.  Flu Enza and RSV PCR nonreactive 4/14-blood cultures NGTD 6/4-sacral wound culture with Pseudomonas aeruginosa and Klebsiella pneumoniae 7/14-pleural fluid culture NGTD   Assessment and plan: Severe sepsis due to sacral pressure wound infection: Present on arrival.  Presents with generalized weakness, poor appetite, hypotension pain and drainage from sacral wound.  Had leukocytosis, tachycardia, hypotension and lactic acidosis on presentation.    -Culture data as above. -4/14-CT showed sacral wound extension to the right gluteus muscle without signs of osteomyelitis. -4/15-underwent bedside debridement of sacral decubitus wound by general surgery -4/21-ID consulted and recommended continuing broad-spectrum antibiotics that he completed on 6/13. 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 -6/17- ID recommended continuing holding off antibiotics since he is HDS, afebrile  without leukocytosis. -6/17-evaluated by plastic surgery.  Sacral wound felt to be clean.  Wound care recommended -7/13-7/13-CT showed similar appearance of osteomyelitis underlying sacral decubitus ulcer.  -7/13-plastic surgery recommended  for continued wound care and no surgical management. -7/14-Ortho evaluated patient for left hip wound.  Debulked and recommended optimizing nutrition -8/5- Surgery consulted at request of patient's daughter for left hip wound and recommended wound care -Continue wound care  -Optimize nutrition-per recommendation by dietitian    PAF/flutter with RVR:   Now rate controlled. -Continue amiodarone  and Eliquis  Eliquis  -Cardiology signed off.   History of HFrEF/cardiac amyloidosis/nonischemic CM/hypotension: Hypotension resolved.  Recurrent pleural effusion and anasarca partly due to nutritional status and hypoalbuminemia.  Not on diuretics at home -Continue Tafamidis  and vutrisiran . -Continue with  low dose lasix    Recurrent bilateral pleural effusion/anasarca: Likely due to CHF, poor nutritional status/hypoalbuminemia -6/18-left-sided thoracentesis by IR.  Body fluid culture without any growth.   -7/14-bilateral thoracentesis by pulmonology.  Fluid transudative and culture negative -7/30-right thoracocentesis with removal of 500 mL of pleural fluid by IR.   -Remains on room air   IDDM-2 with hyperglycemia: A1c 8.4% on 4/3. Recent Labs  Lab 06/24/24 0745 06/24/24 1236 06/24/24 1650 06/24/24 2052 06/25/24 0826  GLUCAP 183* 211* 226* 194* 128*  -Continue SSI-moderate -Continue Semglee  10 units daily   History of CVA: Stable -On Eliquis , Lipitor.    GERD epigastric pain: -Continue PPI   Anemia of chronic disease: Stable -Monitor intermittently   Hypothyroidism:  -Continue levothyroxine   Anxiety and adjustment disorder: -Psychiatry consulted on 7/7 and recommended continuing Zoloft  and Atarax .   Constipation: KUB on 8/1 with nonobstructive bowel gas pattern and moderate stool throughout colon continue bowel regimen -On scheduled MiraLAX  and Senokot    Goals of care: CODE STATUS DNR. 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  Palliative care consulted on 4/20.  Disposition: Per family, patient lives alone before prior hospitalization.  Family not interested in rehab such as SNF.  Also do not feel he is ready to be discharged until his wounds are down to stage II.  Family also do not think [facility] has an ability to provide the care he needs.  Severe malnutrition Body mass index is 19.52 kg/m. Nutrition Problem: Severe Malnutrition Etiology: chronic illness Signs/Symptoms: energy intake < or equal to 75% for > or equal to 1 month, severe muscle depletion Interventions: Ensure Enlive (each supplement provides 350kcal and 20 grams of protein), Magic cup, Hormel Shake, MVI, Juven, Other (Comment) (vitamin C )  Multiple pressure skin injuries: Pressure Injury 02/26/24 Coccyx Mid Stage 4 - Full thickness tissue loss with exposed bone, tendon or muscle. (Active)  02/26/24 0916  Location: Coccyx  Location Orientation: Mid  Staging: Stage 4 - Full thickness tissue loss with exposed bone, tendon or muscle.  Wound Description (Comments):   DO NOT USE:  Present on Admission: Yes  Dressing Type Other (Comment) 06/25/24 0935     Pressure Injury 02/26/24 Toe (Comment  which one) Anterior;Right Deep Tissue Pressure Injury - Purple or maroon localized area of discolored intact skin or blood-filled blister due to damage of underlying soft tissue from pressure and/or shear. right gre (Active)  02/26/24 1215  Location: Toe (Comment  which one)  Location Orientation: Anterior;Right  Staging: Deep Tissue Pressure Injury - Purple or maroon localized area of discolored intact skin or blood-filled blister due to damage of underlying soft tissue from pressure and/or shear.  Wound  Description (Comments): right great toe  DO NOT USE:  Present on Admission:   Dressing Type None 06/22/24 0635     Pressure Injury 03/06/24 Hip Anterior;Left;Proximal Unstageable - Full thickness tissue loss in which  the base of the injury is covered by slough (yellow, tan, gray, green or brown) and/or eschar (tan, brown or black) in the wound bed. unstageable (Active)  03/06/24 1612  Location: Hip  Location Orientation: Anterior;Left;Proximal  Staging: Unstageable - Full thickness tissue loss in which the base of the injury is covered by slough (yellow, tan, gray, green or brown) and/or eschar (tan, brown or black) in the wound bed.  Wound Description (Comments): unstageable  DO NOT USE:  Present on Admission: No  Dressing Type Other (Comment) (vashe, packing foam dsg) 06/25/24 0935     Pressure Injury 03/06/24 Shoulder Left Unstageable - Full thickness tissue loss in which the base of the injury is covered by slough (yellow, tan, gray, green or brown) and/or eschar (tan, brown or black) in the wound bed. unstageable (Active)  03/06/24 1613  Location: Shoulder  Location Orientation: Left  Staging: Unstageable - Full thickness tissue loss in which the base of the injury is covered by slough (yellow, tan, gray, green or brown) and/or eschar (tan, brown or black) in the wound bed.  Wound Description (Comments): unstageable  DO NOT USE:  Present on Admission: No  Dressing Type Other (Comment) 06/25/24 0935     Pressure Injury 04/23/24 Heel Right Unstageable - Full thickness tissue loss in which the base of the injury is covered by slough (yellow, tan, gray, green or brown) and/or eschar (tan, brown or black) in the wound bed. DTPI evolving into unstageable (Active)  04/23/24   Location: Heel  Location Orientation: Right  Staging: Unstageable - Full thickness tissue loss in which the base of the injury is covered by slough (yellow, tan, gray, green or brown) and/or eschar (tan, brown or black) in the wound bed.  Wound Description (Comments): DTPI evolving into unstageable  DO NOT USE:  Present on Admission: No  Dressing Type Other (Comment) 06/25/24 0935     Pressure Injury 04/23/24 Heel Left Deep Tissue  Pressure Injury - Purple or maroon localized area of discolored intact skin or blood-filled blister due to damage of underlying soft tissue from pressure and/or shear. (Active)  04/23/24   Location: Heel  Location Orientation: Left  Staging: Deep Tissue Pressure Injury - Purple or maroon localized area of discolored intact skin or blood-filled blister due to damage of underlying soft tissue from pressure and/or shear.  Wound Description (Comments):   DO NOT USE:  Present on Admission: No  Dressing Type Other (Comment) 06/25/24 0935   DVT prophylaxis:  SCDs Start: 03/04/24 2151 Place TED hose Start: 03/04/24 2151 apixaban  (ELIQUIS ) tablet 5 mg  Code Status: DNR Family Communication: Updated patient's daughter, Para over the phone Level of care: Telemetry Medical Status is: Inpatient Remains inpatient appropriate because: Complicated disposition   Final disposition: Yet to be determined   65 minutes with more than 50% spent in reviewing records, counseling patient/family and coordinating care.   Sch Meds:  Scheduled Meds:  (feeding supplement) PROSource Plus  30 mL Oral BID BM   acetaminophen   650 mg Oral Q8H   amiodarone   200 mg Oral Daily   apixaban   5 mg Oral BID   atorvastatin   40 mg Oral Daily   calcium  carbonate  1 tablet Oral TID   collagenase    Topical Daily  diclofenac  Sodium  4 g Topical QID   feeding supplement  237 mL Oral BID BM   ferrous sulfate   325 mg Oral QHS   furosemide   20 mg Oral Daily   guaiFENesin   600 mg Oral BID   insulin  aspart  0-15 Units Subcutaneous TID WC   insulin  aspart  0-5 Units Subcutaneous QHS   insulin  glargine-yfgn  10 Units Subcutaneous Daily   levothyroxine   50 mcg Oral Q0600   lidocaine   1 patch Transdermal Q24H   liver oil-zinc  oxide   Topical BID   multivitamin with minerals  1 tablet Oral Daily   nutrition supplement (JUVEN)  1 packet Oral BID BM   mouth rinse  15 mL Mouth Rinse 4 times per day   pantoprazole   40 mg Oral  Daily   polyethylene glycol  17 g Oral BID   potassium chloride   20 mEq Oral Daily   senna-docusate  1 tablet Oral QHS   sertraline   50 mg Oral Daily   sodium chloride  flush  10-40 mL Intracatheter Q12H   Tafamidis   1 capsule Oral Daily   vitamin A   10,000 Units Oral Daily   vutrisiran  sodium  25 mg Subcutaneous Q90 days   Continuous Infusions:  dextrose      PRN Meds:.bismuth  subsalicylate, butalbital -acetaminophen -caffeine , dextrose , food thickener, hydrOXYzine , lip balm, meclizine , ondansetron  **OR** ondansetron  (ZOFRAN ) IV, mouth rinse, oxyCODONE , artificial tears  Antimicrobials: Anti-infectives (From admission, onward)    Start     Dose/Rate Route Frequency Ordered Stop   04/27/24 1615  cefTAZidime  (FORTAZ ) 2 g in sodium chloride  0.9 % 100 mL IVPB        2 g 200 mL/hr over 30 Minutes Intravenous Every 8 hours 04/27/24 1518 05/03/24 2359   03/16/24 1015  cefTRIAXone  (ROCEPHIN ) 2 g in sodium chloride  0.9 % 100 mL IVPB        2 g 200 mL/hr over 30 Minutes Intravenous Every 24 hours 03/16/24 0929 03/20/24 1219   03/16/24 1015  doxycycline  (VIBRA -TABS) tablet 100 mg        100 mg Oral Every 12 hours 03/16/24 0929 03/20/24 2131   03/14/24 1000  metroNIDAZOLE  (FLAGYL ) tablet 500 mg        500 mg Oral Every 12 hours 03/14/24 0803 03/19/24 2219   03/13/24 1000  cefadroxil  (DURICEF) capsule 1,000 mg  Status:  Discontinued        1,000 mg Oral 2 times daily 03/12/24 1632 03/16/24 0929   03/07/24 2200  metroNIDAZOLE  (FLAGYL ) tablet 500 mg        500 mg Oral Every 12 hours 03/07/24 0840 03/11/24 2258   03/05/24 1930  vancomycin  (VANCOREADY) IVPB 1250 mg/250 mL  Status:  Discontinued        1,250 mg 166.7 mL/hr over 90 Minutes Intravenous Every 24 hours 03/04/24 2205 03/04/24 2224   03/05/24 1930  Vancomycin  (VANCOCIN ) 1,250 mg in sodium chloride  0.9 % 250 mL IVPB  Status:  Discontinued        1,250 mg 166.7 mL/hr over 90 Minutes Intravenous Every 24 hours 03/04/24 2224 03/05/24 0717    03/05/24 1930  vancomycin  (VANCOREADY) IVPB 1500 mg/300 mL  Status:  Discontinued        1,500 mg 150 mL/hr over 120 Minutes Intravenous Every 24 hours 03/05/24 0717 03/12/24 1636   03/05/24 1400  ceFEPIme  (MAXIPIME ) 2 g in sodium chloride  0.9 % 100 mL IVPB  Status:  Discontinued        2 g 200 mL/hr over  30 Minutes Intravenous Every 8 hours 03/05/24 0717 03/12/24 1632   03/05/24 0600  ceFEPIme  (MAXIPIME ) 2 g in sodium chloride  0.9 % 100 mL IVPB  Status:  Discontinued        2 g 200 mL/hr over 30 Minutes Intravenous Every 12 hours 03/04/24 2205 03/05/24 0717   03/05/24 0000  metroNIDAZOLE  (FLAGYL ) IVPB 500 mg  Status:  Discontinued        500 mg 100 mL/hr over 60 Minutes Intravenous Every 12 hours 03/04/24 2206 03/04/24 2209   03/04/24 1730  aztreonam (AZACTAM) 2 g in sodium chloride  0.9 % 100 mL IVPB  Status:  Discontinued        2 g 200 mL/hr over 30 Minutes Intravenous  Once 03/04/24 1715 03/04/24 1724   03/04/24 1730  metroNIDAZOLE  (FLAGYL ) IVPB 500 mg  Status:  Discontinued        500 mg 100 mL/hr over 60 Minutes Intravenous Every 12 hours 03/04/24 1715 03/07/24 0840   03/04/24 1730  vancomycin  (VANCOREADY) IVPB 1500 mg/300 mL        1,500 mg 150 mL/hr over 120 Minutes Intravenous  Once 03/04/24 1715 03/04/24 2153   03/04/24 1730  ceFEPIme  (MAXIPIME ) 2 g in sodium chloride  0.9 % 100 mL IVPB        2 g 200 mL/hr over 30 Minutes Intravenous  Once 03/04/24 1724 03/04/24 1826        I have personally reviewed the following labs and images: CBC: Recent Labs  Lab 06/21/24 0304  WBC 5.4  HGB 12.5*  HCT 36.9*  MCV 85.6  PLT 306   BMP &GFR Recent Labs  Lab 06/21/24 0304  NA 139  K 4.9  CL 108  CO2 22  GLUCOSE 110*  BUN 29*  CREATININE 1.00  CALCIUM  8.8*   Estimated Creatinine Clearance: 50.3 mL/min (by C-G formula based on SCr of 1 mg/dL). Liver & Pancreas: No results for input(s): AST, ALT, ALKPHOS, BILITOT, PROT, ALBUMIN  in the last 168 hours. No  results for input(s): LIPASE, AMYLASE in the last 168 hours. No results for input(s): AMMONIA in the last 168 hours. Diabetic: No results for input(s): HGBA1C in the last 72 hours. Recent Labs  Lab 06/24/24 0745 06/24/24 1236 06/24/24 1650 06/24/24 2052 06/25/24 0826  GLUCAP 183* 211* 226* 194* 128*   Cardiac Enzymes: No results for input(s): CKTOTAL, CKMB, CKMBINDEX, TROPONINI in the last 168 hours. No results for input(s): PROBNP in the last 8760 hours. Coagulation Profile: No results for input(s): INR, PROTIME in the last 168 hours. Thyroid  Function Tests: No results for input(s): TSH, T4TOTAL, FREET4, T3FREE, THYROIDAB in the last 72 hours. Lipid Profile: No results for input(s): CHOL, HDL, LDLCALC, TRIG, CHOLHDL, LDLDIRECT in the last 72 hours. Anemia Panel: No results for input(s): VITAMINB12, FOLATE, FERRITIN, TIBC, IRON, RETICCTPCT in the last 72 hours. Urine analysis:    Component Value Date/Time   COLORURINE STRAW (A) 03/04/2024 2139   APPEARANCEUR CLEAR 03/04/2024 2139   LABSPEC 1.016 03/04/2024 2139   PHURINE 6.0 03/04/2024 2139   GLUCOSEU >=500 (A) 03/04/2024 2139   HGBUR SMALL (A) 03/04/2024 2139   BILIRUBINUR NEGATIVE 03/04/2024 2139   KETONESUR NEGATIVE 03/04/2024 2139   PROTEINUR 30 (A) 03/04/2024 2139   UROBILINOGEN 0.2 05/05/2015 1443   NITRITE NEGATIVE 03/04/2024 2139   LEUKOCYTESUR NEGATIVE 03/04/2024 2139   Sepsis Labs: Invalid input(s): PROCALCITONIN, LACTICIDVEN  Microbiology: No results found for this or any previous visit (from the past 240 hours).  Radiology Studies: No  results found.    Juneau Doughman T. Kenyette Gundy [name]  If 7PM-7AM, please contact night-coverage [email or web address] 06/25/2024, 12:14 PM

## 2024-06-26 DIAGNOSIS — A419 Sepsis, unspecified organism: Secondary | ICD-10-CM | POA: Diagnosis not present

## 2024-06-26 DIAGNOSIS — L98429 Non-pressure chronic ulcer of back with unspecified severity: Secondary | ICD-10-CM | POA: Diagnosis not present

## 2024-06-26 DIAGNOSIS — I480 Paroxysmal atrial fibrillation: Secondary | ICD-10-CM | POA: Diagnosis not present

## 2024-06-26 DIAGNOSIS — Z8673 Personal history of transient ischemic attack (TIA), and cerebral infarction without residual deficits: Secondary | ICD-10-CM | POA: Diagnosis not present

## 2024-06-26 LAB — GLUCOSE, CAPILLARY
Glucose-Capillary: 105 mg/dL — ABNORMAL HIGH (ref 70–99)
Glucose-Capillary: 106 mg/dL — ABNORMAL HIGH (ref 70–99)
Glucose-Capillary: 113 mg/dL — ABNORMAL HIGH (ref 70–99)
Glucose-Capillary: 102 mg/dL — ABNORMAL HIGH (ref 70–99)
Glucose-Capillary: 172 mg/dL — ABNORMAL HIGH (ref 70–99)

## 2024-06-26 NOTE — Progress Notes (Signed)
 PROGRESS NOTE  Earl [name] FMW:[id number] DOB: [birth year]   PCP: [name], MD  Patient is from: SNF  DOA: 03/04/2024 LOS: 114  Chief complaints No chief complaint on file.    Brief Narrative / Interim history: 83 year old M with PMH of PAF on Eliquis , HFmrEF, amyloid cardiomyopathy, NIDDM-2, CVA with right-sided weakness, stage IV sacral wound, pancytopenia, MGUS, HTN, HLD and hypothyroidism brought to ED by EMS from [facility] rehab due to pain in sacral area and shortness of breath 4 days after he was discharged from the hospital.   Patient was hospitalized from 3/26-4/10 with generalized weakness, encephalopathy and COVID-19 infection.  Per daughter, patient sustained skin injury during this hospitalization, and he has had significant pain, discomfort and progressive foul-smelling discharge from sacral wound over the course of 4 to 5 days before coming back to ED.  No report of constitutional symptoms, chest pain or dyspnea.  In ED, hypotensive to 82/51.  In RVR to 140s.  Na 130.  Glucose elevated to 500.  Not in DKA or HHS.  WBC 13.6 with left shift.  CXR without acute finding.  CT abdomen and pelvis ordered.  Cultures obtained.  Admitted with working diagnosis of sepsis due to sacral pressure ulcer wound infection, A-fib with RVR, hypotension.  Cultures obtained.  Patient was started on broad-spectrum antibiotics and IV fluid.  PCCM, cardiology and general surgery consulted.  See plan and assessment for more on hospital course.  Subjective: Seen and examined earlier this morning.  No major events overnight or this morning.  Complains of pain in his bottom.  He rates his pain about 9/10.  Denies chest pain or shortness of breath.  Denies abdominal pain.  Objective: Vitals:   06/25/24 1729 06/26/24 0500 06/26/24 0515 06/26/24 0802  BP: 108/70  121/60 105/70  Pulse: (!) 48  80 77  Resp: 18   20  Temp:   (!) 97.4 F (36.3 C) (!) 97.4 F (36.3 C)  TempSrc:      SpO2:  100%   97%  Weight:  62.3 kg    Height:        Examination:  GENERAL: No apparent distress.  Nontoxic. HEENT: MMM.  Vision and hearing grossly intact.  NECK: Supple.  No apparent JVD.  RESP: Some work of breathing as he speaks (unchanged). CVS:  RRR. Heart sounds normal.  ABD/GI/GU: BS+. Abd soft, NTND.  MSK/EXT:  Moves extremities.  Prevalon boots in place.  BLE weakness. SKIN: Sacral, left hip and right heel ulcers.  See pictures under media. NEURO: AA.  Oriented appropriately.  Difficult to understand speech partly due to lack of dentition and WOB as he speaks.  BLE weakness. PSYCH: Calm. Normal affect.   Consultants PCCM General Surgery Cardiology Infectious disease Plastic surgery Orthopedic surgery Palliative medicine  Microbiology 4/14-COVID-19 PCR positive.  Flu Enza and RSV PCR nonreactive 4/14-blood cultures NGTD 6/4-sacral wound culture with Pseudomonas aeruginosa and Klebsiella pneumoniae 7/14-pleural fluid culture NGTD   Assessment and plan: Severe sepsis due to sacral pressure wound infection: Present on arrival.  Presents with generalized weakness, poor appetite, hypotension pain and drainage from sacral wound.  Had leukocytosis, tachycardia, hypotension and lactic acidosis on presentation.    -Culture data as above. -4/14-CT showed sacral wound extension to the right gluteus muscle without signs of osteomyelitis. -4/15-underwent bedside debridement of sacral decubitus wound by general surgery -4/21-ID consulted and recommended continuing broad-spectrum antibiotics that he completed on 6/13. 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 -6/17- ID recommended continuing holding off antibiotics since he is HDS, afebrile without leukocytosis. -6/17-evaluated by plastic surgery.  Sacral wound felt to be clean.  Wound care recommended -7/13-7/13-CT showed similar appearance of osteomyelitis underlying  sacral decubitus ulcer.  -7/13-plastic surgery recommended for continued wound care and no surgical management. -7/14-Ortho evaluated patient for left hip wound.  Debulked and recommended optimizing nutrition -8/5- Surgery recommended hydrotherapy for left hip wound. Surgical debridement if not successful -8/5-wound care and instruction updated  -Optimize nutrition-see dietitian instruction below. -Overall, difficult case due to patient's nutritional status and limited mobility    Chronic HFrEF/cardiac amyloidosis/NICM/hypotension: Hypotension resolved.  Recurrent pleural effusion and anasarca partly due to nutritional status and hypoalbuminemia.  Not on diuretics at home -Continue Tafamidis  and vutrisiran . -Continue with  low dose lasix    Recurrent bilateral pleural effusion/anasarca: Likely due to CHF, poor nutritional status/hypoalbuminemia.  Has some work of breathing as he speaks but saturating at 100% on RA.  Denies shortness of breath. -6/18-left-sided thoracentesis by IR.  Body fluid culture without any growth.   -7/14-bilateral thoracentesis by pulmonology.  Fluid transudative and culture negative -7/30-right thoracocentesis with removal of 500 mL of pleural fluid by IR.   -Remains on room air   IDDM-2 with hyperglycemia: A1c 8.4% on 4/3. Recent Labs  Lab 06/25/24 1221 06/25/24 1728 06/25/24 2132 06/26/24 0518 06/26/24 0800  GLUCAP 154* 176* 117* 105* 106*  -Continue SSI-moderate -Continue Semglee  10 units daily  PAF/flutter with RVR:   Now rate controlled. -Continue amiodarone  and Eliquis  Eliquis  -Cardiology signed off.   History of CVA: Stable -On Eliquis , Lipitor.    GERD epigastric pain: -Continue PPI   Anemia of chronic disease: Stable -Monitor intermittently   Hypothyroidism:  -Continue levothyroxine   Anxiety and adjustment disorder: -Psychiatry consulted on 7/7 and recommended continuing Zoloft  and Atarax .   Constipation: KUB on 8/1 with  nonobstructive bowel gas pattern and moderate stool throughout colon continue bowel regimen -On scheduled MiraLAX  and Senokot    Goals of care: CODE STATUS DNR. 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  Palliative care consulted on 4/20.  Disposition: Per family, patient lives alone before prior hospitalization.  Family not interested in SNF or LTACH.  Also do not feel he is ready to be discharged until his wounds are down to stage II.   Severe malnutrition Body mass index is 19.16 kg/m. Nutrition Problem: Severe Malnutrition Etiology: chronic illness Signs/Symptoms: energy intake < or equal to 75% for > or equal to 1 month, severe muscle depletion Interventions: Ensure Enlive (each supplement provides 350kcal and 20 grams of protein), Magic cup, Hormel Shake, MVI, Juven, Other (Comment) (vitamin C )  Multiple pressure skin injuries: Pressure Injury 02/26/24 Coccyx Mid Stage 4 - Full thickness tissue loss with exposed bone, tendon or muscle. (Active)  02/26/24 0916  Location: Coccyx  Location Orientation: Mid  Staging: Stage 4 - Full thickness tissue loss with exposed bone, tendon or muscle.  Wound Description (Comments):   DO NOT USE:  Present on Admission: Yes  Dressing Type Alginate;Other (Comment) 06/25/24 1921     Pressure Injury 02/26/24 Toe (Comment  which one) Anterior;Right Deep Tissue Pressure Injury - Purple or maroon localized area of discolored intact skin or blood-filled blister due to damage of underlying soft tissue from pressure and/or shear. right gre (Active)  02/26/24 1215  Location: Toe (Comment  which one)  Location Orientation: Anterior;Right  Staging: Deep Tissue Pressure Injury -  Purple or maroon localized area of discolored intact skin or blood-filled blister due to damage of underlying soft tissue from pressure and/or shear.  Wound Description (Comments): right great  toe  DO NOT USE:  Present on Admission:   Dressing Type None 06/22/24 0635     Pressure Injury 03/06/24 Hip Anterior;Left;Proximal Unstageable - Full thickness tissue loss in which the base of the injury is covered by slough (yellow, tan, gray, green or brown) and/or eschar (tan, brown or black) in the wound bed. unstageable (Active)  03/06/24 1612  Location: Hip  Location Orientation: Anterior;Left;Proximal  Staging: Unstageable - Full thickness tissue loss in which the base of the injury is covered by slough (yellow, tan, gray, green or brown) and/or eschar (tan, brown or black) in the wound bed.  Wound Description (Comments): unstageable  DO NOT USE:  Present on Admission: No  Dressing Type Other (Comment) (vashe, packing foam dsg) 06/25/24 0935     Pressure Injury 03/06/24 Shoulder Left Unstageable - Full thickness tissue loss in which the base of the injury is covered by slough (yellow, tan, gray, green or brown) and/or eschar (tan, brown or black) in the wound bed. unstageable (Active)  03/06/24 1613  Location: Shoulder  Location Orientation: Left  Staging: Unstageable - Full thickness tissue loss in which the base of the injury is covered by slough (yellow, tan, gray, green or brown) and/or eschar (tan, brown or black) in the wound bed.  Wound Description (Comments): unstageable  DO NOT USE:  Present on Admission: No  Dressing Type Other (Comment) 06/25/24 0935     Pressure Injury 04/23/24 Heel Right Unstageable - Full thickness tissue loss in which the base of the injury is covered by slough (yellow, tan, gray, green or brown) and/or eschar (tan, brown or black) in the wound bed. DTPI evolving into unstageable (Active)  04/23/24   Location: Heel  Location Orientation: Right  Staging: Unstageable - Full thickness tissue loss in which the base of the injury is covered by slough (yellow, tan, gray, green or brown) and/or eschar (tan, brown or black) in the wound bed.  Wound  Description (Comments): DTPI evolving into unstageable  DO NOT USE:  Present on Admission: No  Dressing Type Other (Comment) 06/25/24 0935     Pressure Injury 04/23/24 Heel Left Deep Tissue Pressure Injury - Purple or maroon localized area of discolored intact skin or blood-filled blister due to damage of underlying soft tissue from pressure and/or shear. (Active)  04/23/24   Location: Heel  Location Orientation: Left  Staging: Deep Tissue Pressure Injury - Purple or maroon localized area of discolored intact skin or blood-filled blister due to damage of underlying soft tissue from pressure and/or shear.  Wound Description (Comments):   DO NOT USE:  Present on Admission: No  Dressing Type Other (Comment) 06/25/24 0935   DVT prophylaxis:  SCDs Start: 03/04/24 2151 Place TED hose Start: 03/04/24 2151 apixaban  (ELIQUIS ) tablet 5 mg  Code Status: DNR Family Communication: Updated patient's daughter, Para over the phone on 8/5 Level of care: Telemetry Medical Status is: Inpatient Remains inpatient appropriate because: Complicated disposition   Final disposition: Yet to be determined   35 minutes with more than 50% spent in reviewing records, counseling patient/family and coordinating care.   Sch Meds:  Scheduled Meds:  (feeding supplement) PROSource Plus  30 mL Oral BID BM   acetaminophen   650 mg Oral Q8H   amiodarone   200 mg Oral Daily   apixaban   5 mg  Oral BID   atorvastatin   40 mg Oral Daily   calcium  carbonate  1 tablet Oral TID   collagenase    Topical Daily   diclofenac  Sodium  4 g Topical QID   feeding supplement  237 mL Oral BID BM   ferrous sulfate   325 mg Oral QHS   furosemide   20 mg Oral Daily   guaiFENesin   600 mg Oral BID   insulin  aspart  0-15 Units Subcutaneous TID WC   insulin  aspart  0-5 Units Subcutaneous QHS   insulin  glargine-yfgn  10 Units Subcutaneous Daily   levothyroxine   50 mcg Oral Q0600   lidocaine   1 patch Transdermal Q24H   liver oil-zinc  oxide    Topical BID   multivitamin with minerals  1 tablet Oral Daily   nutrition supplement (JUVEN)  1 packet Oral BID BM   mouth rinse  15 mL Mouth Rinse 4 times per day   pantoprazole   40 mg Oral Daily   polyethylene glycol  17 g Oral BID   potassium chloride   20 mEq Oral Daily   senna-docusate  1 tablet Oral QHS   sertraline   50 mg Oral Daily   sodium chloride  flush  10-40 mL Intracatheter Q12H   Tafamidis   1 capsule Oral Daily   vitamin A   10,000 Units Oral Daily   vutrisiran  sodium  25 mg Subcutaneous Q90 days   Continuous Infusions:  dextrose      PRN Meds:.bismuth  subsalicylate, butalbital -acetaminophen -caffeine , dextrose , food thickener, hydrOXYzine , lip balm, meclizine , ondansetron  **OR** ondansetron  (ZOFRAN ) IV, mouth rinse, oxyCODONE , artificial tears  Antimicrobials: Anti-infectives (From admission, onward)    Start     Dose/Rate Route Frequency Ordered Stop   04/27/24 1615  cefTAZidime  (FORTAZ ) 2 g in sodium chloride  0.9 % 100 mL IVPB        2 g 200 mL/hr over 30 Minutes Intravenous Every 8 hours 04/27/24 1518 05/03/24 2359   03/16/24 1015  cefTRIAXone  (ROCEPHIN ) 2 g in sodium chloride  0.9 % 100 mL IVPB        2 g 200 mL/hr over 30 Minutes Intravenous Every 24 hours 03/16/24 0929 03/20/24 1219   03/16/24 1015  doxycycline  (VIBRA -TABS) tablet 100 mg        100 mg Oral Every 12 hours 03/16/24 0929 03/20/24 2131   03/14/24 1000  metroNIDAZOLE  (FLAGYL ) tablet 500 mg        500 mg Oral Every 12 hours 03/14/24 0803 03/19/24 2219   03/13/24 1000  cefadroxil  (DURICEF) capsule 1,000 mg  Status:  Discontinued        1,000 mg Oral 2 times daily 03/12/24 1632 03/16/24 0929   03/07/24 2200  metroNIDAZOLE  (FLAGYL ) tablet 500 mg        500 mg Oral Every 12 hours 03/07/24 0840 03/11/24 2258   03/05/24 1930  vancomycin  (VANCOREADY) IVPB 1250 mg/250 mL  Status:  Discontinued        1,250 mg 166.7 mL/hr over 90 Minutes Intravenous Every 24 hours 03/04/24 2205 03/04/24 2224   03/05/24 1930   Vancomycin  (VANCOCIN ) 1,250 mg in sodium chloride  0.9 % 250 mL IVPB  Status:  Discontinued        1,250 mg 166.7 mL/hr over 90 Minutes Intravenous Every 24 hours 03/04/24 2224 03/05/24 0717   03/05/24 1930  vancomycin  (VANCOREADY) IVPB 1500 mg/300 mL  Status:  Discontinued        1,500 mg 150 mL/hr over 120 Minutes Intravenous Every 24 hours 03/05/24 0717 03/12/24 1636   03/05/24 1400  ceFEPIme  (MAXIPIME ) 2 g in sodium chloride  0.9 % 100 mL IVPB  Status:  Discontinued        2 g 200 mL/hr over 30 Minutes Intravenous Every 8 hours 03/05/24 0717 03/12/24 1632   03/05/24 0600  ceFEPIme  (MAXIPIME ) 2 g in sodium chloride  0.9 % 100 mL IVPB  Status:  Discontinued        2 g 200 mL/hr over 30 Minutes Intravenous Every 12 hours 03/04/24 2205 03/05/24 0717   03/05/24 0000  metroNIDAZOLE  (FLAGYL ) IVPB 500 mg  Status:  Discontinued        500 mg 100 mL/hr over 60 Minutes Intravenous Every 12 hours 03/04/24 2206 03/04/24 2209   03/04/24 1730  aztreonam (AZACTAM) 2 g in sodium chloride  0.9 % 100 mL IVPB  Status:  Discontinued        2 g 200 mL/hr over 30 Minutes Intravenous  Once 03/04/24 1715 03/04/24 1724   03/04/24 1730  metroNIDAZOLE  (FLAGYL ) IVPB 500 mg  Status:  Discontinued        500 mg 100 mL/hr over 60 Minutes Intravenous Every 12 hours 03/04/24 1715 03/07/24 0840   03/04/24 1730  vancomycin  (VANCOREADY) IVPB 1500 mg/300 mL        1,500 mg 150 mL/hr over 120 Minutes Intravenous  Once 03/04/24 1715 03/04/24 2153   03/04/24 1730  ceFEPIme  (MAXIPIME ) 2 g in sodium chloride  0.9 % 100 mL IVPB        2 g 200 mL/hr over 30 Minutes Intravenous  Once 03/04/24 1724 03/04/24 1826        I have personally reviewed the following labs and images: CBC: Recent Labs  Lab 06/21/24 0304  WBC 5.4  HGB 12.5*  HCT 36.9*  MCV 85.6  PLT 306   BMP &GFR Recent Labs  Lab 06/21/24 0304  NA 139  K 4.9  CL 108  CO2 22  GLUCOSE 110*  BUN 29*  CREATININE 1.00  CALCIUM  8.8*   Estimated Creatinine  Clearance: 49.3 mL/min (by C-G formula based on SCr of 1 mg/dL). Liver & Pancreas: No results for input(s): AST, ALT, ALKPHOS, BILITOT, PROT, ALBUMIN  in the last 168 hours. No results for input(s): LIPASE, AMYLASE in the last 168 hours. No results for input(s): AMMONIA in the last 168 hours. Diabetic: No results for input(s): HGBA1C in the last 72 hours. Recent Labs  Lab 06/25/24 1221 06/25/24 1728 06/25/24 2132 06/26/24 0518 06/26/24 0800  GLUCAP 154* 176* 117* 105* 106*   Cardiac Enzymes: No results for input(s): CKTOTAL, CKMB, CKMBINDEX, TROPONINI in the last 168 hours. No results for input(s): PROBNP in the last 8760 hours. Coagulation Profile: No results for input(s): INR, PROTIME in the last 168 hours. Thyroid  Function Tests: No results for input(s): TSH, T4TOTAL, FREET4, T3FREE, THYROIDAB in the last 72 hours. Lipid Profile: No results for input(s): CHOL, HDL, LDLCALC, TRIG, CHOLHDL, LDLDIRECT in the last 72 hours. Anemia Panel: No results for input(s): VITAMINB12, FOLATE, FERRITIN, TIBC, IRON, RETICCTPCT in the last 72 hours. Urine analysis:    Component Value Date/Time   COLORURINE STRAW (A) 03/04/2024 2139   APPEARANCEUR CLEAR 03/04/2024 2139   LABSPEC 1.016 03/04/2024 2139   PHURINE 6.0 03/04/2024 2139   GLUCOSEU >=500 (A) 03/04/2024 2139   HGBUR SMALL (A) 03/04/2024 2139   BILIRUBINUR NEGATIVE 03/04/2024 2139   KETONESUR NEGATIVE 03/04/2024 2139   PROTEINUR 30 (A) 03/04/2024 2139   UROBILINOGEN 0.2 05/05/2015 1443   NITRITE NEGATIVE 03/04/2024 2139   LEUKOCYTESUR NEGATIVE 03/04/2024 2139  Sepsis Labs: Invalid input(s): PROCALCITONIN, LACTICIDVEN  Microbiology: No results found for this or any previous visit (from the past 240 hours).  Radiology Studies: No results found.    Bethenny Losee T. Asya Derryberry [name]  If 7PM-7AM, please contact night-coverage [email or web address] 06/26/2024,  10:59 AM

## 2024-06-26 NOTE — TOC Progression Note (Signed)
 Transition of Care [facility]) - Progression Note    Patient Details  Name: Earl [name] MRN: [id number] Date of Birth: 1941/04/09  [facility] CM/SW Contact  Rosaline JONELLE Joe, RN Phone Number: 06/26/2024, 3:20 PM  Clinical Narrative:    CM spoke with attending physician, Dr. Kathrin and he states that the patient's daughter, Para verbalized to him that she does not want him discharged from the hospital until his wounds are stage 2, otherwise she will seek legal counsel.  I called and spoke with Harlene Jes, IP Care management Supervisor and Dr. Kimble and asked that hospital leadership speak with the daughter regarding her complaints relating to patient's wounds and request for the patient to remain in the hospital, despite patient's opportunity to transition for LTC facility for care.  I spoke with HILLARY Crimes, CM with [facility] and the facility has offered to start insurance authorization for possible LTAC placement but the daughter, Para declined earlier in the week relaying that she was not happy with [facility]'s visitation policy and declined speaking with [facility] regarding bed offer.    IP Care management leadership team is aware of barriers to discharge and daughter's request for patient to remain inpatient at [facility] hospital.  Expected Discharge Plan: Skilled Nursing Facility Barriers to Discharge: Continued Medical Work up               Expected Discharge Plan and Services In-house Referral: Clinical Social Work     Living arrangements for the past 2 months:  (from [facility] short term rehab)                                       Social Drivers of Health (SDOH) Interventions SDOH Screenings   Food Insecurity: No Food Insecurity (03/05/2024)  Housing: Low Risk  (03/05/2024)  Transportation Needs: No Transportation Needs (03/05/2024)  Utilities: Not At Risk (03/05/2024)  Depression (PHQ2-9): Low Risk  (10/03/2023)  Financial  Resource Strain: Patient Declined (01/16/2024)  Physical Activity: Unknown (01/16/2024)  Social Connections: Moderately Integrated (03/05/2024)  Stress: Stress Concern Present (01/16/2024)  Tobacco Use: Low Risk  (05/12/2024)    Readmission Risk Interventions    03/19/2024   10:24 AM  Readmission Risk Prevention Plan  Transportation Screening Complete  Medication Review (RN Care Manager) Complete  PCP or Specialist appointment within 3-5 days of discharge Complete  HRI or Home Care Consult Complete  SW Recovery Care/Counseling Consult Complete  Palliative Care Screening Complete  Skilled Nursing Facility Complete

## 2024-06-26 NOTE — Plan of Care (Signed)
   Medical records reviewed including progress notes, labs and imaging. Surgery team recommendations for hydrotherapy noted. Surgical debridement to be considered if needed after hydrotherapy.   Goals of care remain clear for continued medical interventions and hospital is the preferred setting for this care. Hope is for improvement of patient's wounds then transition to home with outpatient palliative care. Patient is satisfied with current symptom management plan.  PMT will continue to follow peripherally. No other acute needs at this time.  Thank you for your referral and allowing PMT to assist in [name]'s care.   Earl Jesus, PA-C Palliative Medicine Team  Team Phone # [phone]   NO CHARGE

## 2024-06-26 NOTE — Progress Notes (Signed)
 Physical Therapy Treatment Patient Details Name: Earl [name] MRN: [id number] DOB: [birth year] Today's Date: 06/26/2024   History of Present Illness 83 y.o. male admitted from [facility] rehab on 03/04/24 with SOB, sacral pain, Afib with RVR. Pt with acute metabolic encephalopathy, rhabdomyolysis and sepsis secondary to sacral wound infection. 4/15 & 4/17 bedside sacral wound debridement. Pt also with B pleural effusion, 6/18 L thoracentesis, 7/14 L & R thoracentesis.  PMhx: adm 3/26-4/08/2024 with fall, AMS, COVID; d/c to SNF rehab. CVA (2021, residual R-side weakness), T2DM, HTN, CHF, MGUS, Afib on Eliquis .    PT Comments  Pt drowsy, but able to arouse with verbal stimuli. Pt agreeable to participate in physical therapy session. Focus on pre transfer training and functional strengthening using Stedy to stand from edge of bed x 6. Pt unable to sustain static standing > 15s. Displays proximal weakness. Will continue to progress as tolerated. Goals updated.    If plan is discharge home, recommend the following: Two people to help with walking and/or transfers;Two people to help with bathing/dressing/bathroom;Assistance with feeding;Assist for transportation   Can travel by private vehicle     No  Equipment Recommendations  Hospital bed;Hoyer lift;Wheelchair cushion (measurements PT);Wheelchair (measurements PT) (air mattress, Roho cushion)    Recommendations for Other Services       Precautions / Restrictions Precautions Precautions: Fall Recall of Precautions/Restrictions: Impaired Precaution/Restrictions Comments: sacral wound; L hip wound with wound vac, bilat heel wounds; L shoulder wound Restrictions Weight Bearing Restrictions Per Provider Order: No     Mobility  Bed Mobility Overal bed mobility: Needs Assistance Bed Mobility: Supine to Sit, Sit to Supine, Rolling Rolling: Mod assist, Used rails   Supine to sit: Max assist, +2 for safety/equipment Sit to supine: Max  assist, +2 for safety/equipment   General bed mobility comments: Pt able to initiate bringing BLE's towards edge of bed, ultimately requiring assist to execute and bring trunk to upright. Returned to supine and performed rolling for pad adjument    Transfers Overall transfer level: Needs assistance Equipment used: Ambulation equipment used Transfers: Sit to/from Stand Sit to Stand: From elevated surface, Via lift equipment, Mod assist           General transfer comment: ModA to boost up to standing with Stedy, verbal cues for bilateral quad and glute activation    Ambulation/Gait                   Stairs             Wheelchair Mobility     Tilt Bed    Modified Rankin (Stroke Patients Only)       Balance Overall balance assessment: Needs assistance Sitting-balance support: Single extremity supported, Bilateral upper extremity supported, Feet supported Sitting balance-Leahy Scale: Poor Sitting balance - Comments: Posterior lean                                    Communication Communication Communication: Impaired Factors Affecting Communication: Reduced clarity of speech;Difficulty expressing self  Cognition Arousal: Alert Behavior During Therapy: WFL for tasks assessed/performed   PT - Cognitive impairments: Problem solving Difficult to assess due to: Impaired communication                       Following commands: Impaired Following commands impaired: Follows one step commands with increased time    Cueing Cueing Techniques: Verbal cues,  Tactile cues, Visual cues  Exercises      General Comments        Pertinent Vitals/Pain Pain Assessment Pain Assessment: Faces Faces Pain Scale: Hurts little more Pain Location: sacrum Pain Descriptors / Indicators: Discomfort, Grimacing Pain Intervention(s): Limited activity within patient's tolerance, Monitored during session, Repositioned    Home Living                           Prior Function            PT Goals (current goals can now be found in the care plan section) Acute Rehab PT Goals Patient Stated Goal: move better, go home Time For Goal Achievement: 07/10/24 Potential to Achieve Goals: Fair Progress towards PT goals: Progressing toward goals    Frequency    Min 1X/week      PT Plan      Co-evaluation              AM-PAC PT 6 Clicks Mobility   Outcome Measure  Help needed turning from your back to your side while in a flat bed without using bedrails?: A Lot Help needed moving from lying on your back to sitting on the side of a flat bed without using bedrails?: A Lot Help needed moving to and from a bed to a chair (including a wheelchair)?: Total Help needed standing up from a chair using your arms (e.g., wheelchair or bedside chair)?: Total Help needed to walk in hospital room?: Total Help needed climbing 3-5 steps with a railing? : Total 6 Click Score: 8    End of Session Equipment Utilized During Treatment: Gait belt Activity Tolerance: Patient tolerated treatment well Patient left: in bed;with call bell/phone within reach   PT Visit Diagnosis: Muscle weakness (generalized) (M62.81);Adult, failure to thrive (R62.7);Other abnormalities of gait and mobility (R26.89);Unsteadiness on feet (R26.81);Difficulty in walking, not elsewhere classified (R26.2) Pain - Right/Left: Left Pain - part of body: Knee     Time: 8946-8876 PT Time Calculation (min) (ACUTE ONLY): 30 min  Charges:    $Therapeutic Activity: 23-37 mins PT General Charges $$ ACUTE PT VISIT: 1 Visit                     Earl [name], PT, DPT Acute Rehabilitation Services Office [phone]    Earl [name] 06/26/2024, 12:38 PM

## 2024-06-26 NOTE — Plan of Care (Signed)
  Problem: Fluid Volume: Goal: Hemodynamic stability will improve Outcome: Progressing   Problem: Clinical Measurements: Goal: Diagnostic test results will improve Outcome: Progressing Goal: Signs and symptoms of infection will decrease Outcome: Progressing   Problem: Education: Goal: Knowledge of risk factors and measures for prevention of condition will improve Outcome: Progressing   Problem: Coping: Goal: Psychosocial and spiritual needs will be supported Outcome: Progressing   Problem: Respiratory: Goal: Will maintain a patent airway Outcome: Progressing Goal: Complications related to the disease process, condition or treatment will be avoided or minimized Outcome: Progressing   Problem: Education: Goal: Ability to describe self-care measures that may prevent or decrease complications (Diabetes Survival Skills Education) will improve Outcome: Progressing   Problem: Coping: Goal: Ability to adjust to condition or change in health will improve Outcome: Progressing   Problem: Fluid Volume: Goal: Ability to maintain a balanced intake and output will improve Outcome: Progressing   Problem: Health Behavior/Discharge Planning: Goal: Ability to manage health-related needs will improve Outcome: Progressing   Problem: Metabolic: Goal: Ability to maintain appropriate glucose levels will improve Outcome: Progressing   Problem: Nutritional: Goal: Maintenance of adequate nutrition will improve Outcome: Progressing Goal: Progress toward achieving an optimal weight will improve Outcome: Progressing   Problem: Skin Integrity: Goal: Risk for impaired skin integrity will decrease Outcome: Progressing   Problem: Tissue Perfusion: Goal: Adequacy of tissue perfusion will improve Outcome: Progressing   Problem: Education: Goal: Knowledge of General Education information will improve Description: Including pain rating scale, medication(s)/side effects and non-pharmacologic  comfort measures Outcome: Progressing   Problem: Health Behavior/Discharge Planning: Goal: Ability to manage health-related needs will improve Outcome: Progressing   Problem: Clinical Measurements: Goal: Ability to maintain clinical measurements within normal limits will improve Outcome: Progressing Goal: Will remain free from infection Outcome: Progressing Goal: Diagnostic test results will improve Outcome: Progressing Goal: Respiratory complications will improve Outcome: Progressing Goal: Cardiovascular complication will be avoided Outcome: Progressing   Problem: Activity: Goal: Risk for activity intolerance will decrease Outcome: Progressing   Problem: Nutrition: Goal: Adequate nutrition will be maintained Outcome: Progressing   Problem: Coping: Goal: Level of anxiety will decrease Outcome: Progressing   Problem: Elimination: Goal: Will not experience complications related to bowel motility Outcome: Progressing Goal: Will not experience complications related to urinary retention Outcome: Progressing   Problem: Pain Managment: Goal: General experience of comfort will improve and/or be controlled Outcome: Progressing   Problem: Safety: Goal: Ability to remain free from injury will improve Outcome: Progressing   Problem: Skin Integrity: Goal: Risk for impaired skin integrity will decrease Outcome: Progressing

## 2024-06-26 NOTE — Progress Notes (Signed)
 Physical Therapy Wound Treatment Patient Details  Name: Earl [name] MRN: [id number] Date of Birth: 1941/09/11  Today's Date: 06/26/2024 Time: 8888-8843 Time Calculation (min): 45 min  Subjective  Subjective Assessment Subjective: WOC RN in room for assessment prior to PT wound session Patient and Family Stated Goals: Heal wounds Date of Onset:  (unknown) Prior Treatments: Dressing changes, off weighting  Pain Score:  Faces 6/10 pt refuses pain medication   Wound Assessment  Pressure Injury 03/06/24 Hip Anterior;Left;Proximal Unstageable - Full thickness tissue loss in which the base of the injury is covered by slough (yellow, tan, gray, green or brown) and/or eschar (tan, brown or black) in the wound bed. unstageable (Active)  Wound Image   06/26/24 1700  Dressing Type Other (Comment);Foam - Lift dressing to assess site every shift;Gauze (Comment);Santyl  06/26/24 1700  Dressing Clean, Dry, Intact 06/26/24 1700  Dressing Change Frequency Daily 06/26/24 1700  State of Healing Non-healing 06/26/24 1700  Site / Wound Assessment Yellow 06/26/24 1700  % Wound base Red or Granulating 5% 06/26/24 1700  % Wound base Yellow/Fibrinous Exudate 95% 06/26/24 1700  % Wound base Black/Eschar 0% 06/01/24 0815  % Wound base Other/Granulation Tissue (Comment) 5% 06/01/24 0815  Peri-wound Assessment Denuded;Induration;Pink;Erythema (blanchable) 06/26/24 1700  Wound Length (cm) 4.1 cm 06/26/24 1700  Wound Width (cm) 3.2 cm 06/26/24 1700  Wound Depth (cm) 3.2 cm 06/26/24 1700  Wound Surface Area (cm^2) 13.12 cm^2 06/26/24 1700  Wound Volume (cm^3) 41.98 cm^3 06/26/24 1700  Tunneling (cm) 0 05/17/24 1457  Undermining (cm) 12 o'clock 0.5, 1 o'clock 1.6, 2 o'clock 1.1, 3 o'clock 0.5, 4 o'clock 0.6 06/26/24 1700  Margins Unattached edges (unapproximated) 06/26/24 1700  Drainage Amount Moderate 06/26/24 1700  Drainage Description Serosanguineous;Odor - foul 06/26/24 1700  Treatment  Cleansed;Debridement (Selective);Irrigation 06/26/24 1700   Selective Debridement (non-excisional) Selective Debridement (non-excisional) - Location: L hip Selective Debridement (non-excisional) - Tools Used: Forceps, Scalpel Selective Debridement (non-excisional) - Tissue Removed: Slough & non-viable adipose tissue    Wound Assessment and Plan  Wound Therapy - Assess/Plan/Recommendations Wound Therapy - Clinical Statement: Hydrotherapy reconsult after period of Woudn vac use. Wound has continued to progress increasing in length, width and depth and area of undermining evolving from 12 o'clock to 4 o'clock. Pt will benefit from selective debridement to decreased bioburden to promote wound healing. Wound Therapy - Functional Problem List: Decreased tolerance for position changes or OOB. Factors Delaying/Impairing Wound Healing: Diabetes Mellitus, Immobility, Infection - systemic/local, Multiple medical problems Hydrotherapy Plan: Debridement, Dressing change, Patient/family education Wound Therapy - Frequency: 2X / week Wound Therapy - Follow Up Recommendations: dressing changes by RN  Wound Therapy Goals- Improve the function of patient's integumentary system by progressing the wound(s) through the phases of wound healing (inflammation - proliferation - remodeling) by: Wound Therapy Goals - Improve the function of patient's integumentary system by progressing the wound(s) through the phases of wound healing by: Decrease Necrotic Tissue to: 20% Decrease Necrotic Tissue - Progress: Goal set today Increase Granulation Tissue to: 80% Increase Granulation Tissue - Progress: Goal set today Goals/treatment plan/discharge plan were made with and agreed upon by patient/family: Yes Time For Goal Achievement: 7 days Wound Therapy - Potential for Goals: Fair  Goals will be updated until maximal potential achieved or discharge criteria met.  Discharge criteria: when goals achieved, discharge from  hospital, MD decision/surgical intervention, no progress towards goals, refusal/missing three consecutive treatments without notification or medical reason.  GP     Charges PT Wound Care Charges $Wound  Debridement up to 20 cm: < or equal to 20 cm $PT Hydrotherapy Dressing: 1 dressing     Earl [name] PT, DPT Acute Rehabilitation Services Please use secure chat or  Call Office [phone]    Earl [name] 06/26/2024, 5:25 PM

## 2024-06-27 DIAGNOSIS — I480 Paroxysmal atrial fibrillation: Secondary | ICD-10-CM | POA: Diagnosis not present

## 2024-06-27 DIAGNOSIS — A419 Sepsis, unspecified organism: Secondary | ICD-10-CM | POA: Diagnosis not present

## 2024-06-27 DIAGNOSIS — L98429 Non-pressure chronic ulcer of back with unspecified severity: Secondary | ICD-10-CM | POA: Diagnosis not present

## 2024-06-27 DIAGNOSIS — Z8673 Personal history of transient ischemic attack (TIA), and cerebral infarction without residual deficits: Secondary | ICD-10-CM | POA: Diagnosis not present

## 2024-06-27 LAB — GLUCOSE, CAPILLARY
Glucose-Capillary: 167 mg/dL — ABNORMAL HIGH (ref 70–99)
Glucose-Capillary: 182 mg/dL — ABNORMAL HIGH (ref 70–99)
Glucose-Capillary: 188 mg/dL — ABNORMAL HIGH (ref 70–99)
Glucose-Capillary: 192 mg/dL — ABNORMAL HIGH (ref 70–99)
Glucose-Capillary: 65 mg/dL — ABNORMAL LOW (ref 70–99)
Glucose-Capillary: 75 mg/dL (ref 70–99)

## 2024-06-27 MED ORDER — INSULIN GLARGINE-YFGN 100 UNIT/ML ~~LOC~~ SOLN
8.0000 [IU] | Freq: Every day | SUBCUTANEOUS | Status: DC
  Administered 2024-06-28 – 2024-07-01 (×5): 8 [IU] via SUBCUTANEOUS
  Filled 2024-06-27 (×5): qty 0.08

## 2024-06-27 MED ORDER — INSULIN ASPART 100 UNIT/ML IJ SOLN
0.0000 [IU] | Freq: Three times a day (TID) | INTRAMUSCULAR | Status: DC
  Administered 2024-06-27: 2 [IU] via SUBCUTANEOUS
  Administered 2024-06-28: 3 [IU] via SUBCUTANEOUS
  Administered 2024-06-29 – 2024-07-01 (×6): 1 [IU] via SUBCUTANEOUS
  Administered 2024-07-02 (×2): 2 [IU] via SUBCUTANEOUS
  Administered 2024-07-03 – 2024-07-05 (×9): 1 [IU] via SUBCUTANEOUS
  Administered 2024-07-06 – 2024-07-07 (×3): 2 [IU] via SUBCUTANEOUS
  Administered 2024-07-07: 1 [IU] via SUBCUTANEOUS
  Administered 2024-07-08: 2 [IU] via SUBCUTANEOUS
  Administered 2024-07-08: 1 [IU] via SUBCUTANEOUS
  Administered 2024-07-09 (×2): 2 [IU] via SUBCUTANEOUS
  Administered 2024-07-10: 1 [IU] via SUBCUTANEOUS
  Administered 2024-07-10: 2 [IU] via SUBCUTANEOUS
  Administered 2024-07-11: 1 [IU] via SUBCUTANEOUS
  Administered 2024-07-12: 3 [IU] via SUBCUTANEOUS
  Administered 2024-07-12: 2 [IU] via SUBCUTANEOUS
  Administered 2024-07-13: 3 [IU] via SUBCUTANEOUS
  Administered 2024-07-14 – 2024-07-16 (×3): 2 [IU] via SUBCUTANEOUS
  Administered 2024-07-17: 1 [IU] via SUBCUTANEOUS
  Administered 2024-07-17: 2 [IU] via SUBCUTANEOUS
  Administered 2024-07-18 (×2): 1 [IU] via SUBCUTANEOUS
  Administered 2024-07-19 (×2): 2 [IU] via SUBCUTANEOUS
  Administered 2024-07-20 – 2024-07-21 (×3): 1 [IU] via SUBCUTANEOUS
  Administered 2024-07-22: 3 [IU] via SUBCUTANEOUS
  Administered 2024-07-22 – 2024-07-23 (×2): 2 [IU] via SUBCUTANEOUS
  Administered 2024-07-23 (×2): 1 [IU] via SUBCUTANEOUS
  Administered 2024-07-24: 2 [IU] via SUBCUTANEOUS
  Administered 2024-07-24: 1 [IU] via SUBCUTANEOUS
  Administered 2024-07-25 (×3): 2 [IU] via SUBCUTANEOUS
  Administered 2024-07-26: 1 [IU] via SUBCUTANEOUS
  Administered 2024-07-27 – 2024-07-28 (×3): 2 [IU] via SUBCUTANEOUS
  Administered 2024-07-28 – 2024-07-29 (×3): 1 [IU] via SUBCUTANEOUS
  Administered 2024-07-30: 3 [IU] via SUBCUTANEOUS
  Administered 2024-07-31: 2 [IU] via SUBCUTANEOUS
  Administered 2024-07-31: 1 [IU] via SUBCUTANEOUS
  Administered 2024-08-01 – 2024-08-02 (×3): 2 [IU] via SUBCUTANEOUS
  Administered 2024-08-03 (×2): 1 [IU] via SUBCUTANEOUS
  Administered 2024-08-04 – 2024-08-05 (×3): 2 [IU] via SUBCUTANEOUS
  Administered 2024-08-06 (×2): 1 [IU] via SUBCUTANEOUS
  Administered 2024-08-07 – 2024-08-11 (×9): 2 [IU] via SUBCUTANEOUS
  Administered 2024-08-12 (×2): 1 [IU] via SUBCUTANEOUS
  Administered 2024-08-13 – 2024-08-14 (×2): 2 [IU] via SUBCUTANEOUS
  Administered 2024-08-14: 1 [IU] via SUBCUTANEOUS
  Administered 2024-08-15 (×2): 2 [IU] via SUBCUTANEOUS

## 2024-06-27 NOTE — Plan of Care (Signed)
  Problem: Fluid Volume: Goal: Hemodynamic stability will improve Outcome: Progressing   Problem: Clinical Measurements: Goal: Diagnostic test results will improve Outcome: Progressing Goal: Signs and symptoms of infection will decrease Outcome: Progressing   Problem: Education: Goal: Knowledge of risk factors and measures for prevention of condition will improve Outcome: Progressing   Problem: Coping: Goal: Psychosocial and spiritual needs will be supported Outcome: Progressing   Problem: Respiratory: Goal: Will maintain a patent airway Outcome: Progressing Goal: Complications related to the disease process, condition or treatment will be avoided or minimized Outcome: Progressing   Problem: Education: Goal: Ability to describe self-care measures that may prevent or decrease complications (Diabetes Survival Skills Education) will improve Outcome: Progressing   Problem: Coping: Goal: Ability to adjust to condition or change in health will improve Outcome: Progressing   Problem: Fluid Volume: Goal: Ability to maintain a balanced intake and output will improve Outcome: Progressing   Problem: Health Behavior/Discharge Planning: Goal: Ability to manage health-related needs will improve Outcome: Progressing   Problem: Metabolic: Goal: Ability to maintain appropriate glucose levels will improve Outcome: Progressing   Problem: Nutritional: Goal: Maintenance of adequate nutrition will improve Outcome: Progressing Goal: Progress toward achieving an optimal weight will improve Outcome: Progressing   Problem: Skin Integrity: Goal: Risk for impaired skin integrity will decrease Outcome: Progressing   Problem: Tissue Perfusion: Goal: Adequacy of tissue perfusion will improve Outcome: Progressing   Problem: Education: Goal: Knowledge of General Education information will improve Description: Including pain rating scale, medication(s)/side effects and non-pharmacologic  comfort measures Outcome: Progressing   Problem: Health Behavior/Discharge Planning: Goal: Ability to manage health-related needs will improve Outcome: Progressing   Problem: Clinical Measurements: Goal: Ability to maintain clinical measurements within normal limits will improve Outcome: Progressing Goal: Will remain free from infection Outcome: Progressing Goal: Diagnostic test results will improve Outcome: Progressing Goal: Respiratory complications will improve Outcome: Progressing Goal: Cardiovascular complication will be avoided Outcome: Progressing   Problem: Activity: Goal: Risk for activity intolerance will decrease Outcome: Progressing   Problem: Nutrition: Goal: Adequate nutrition will be maintained Outcome: Progressing   Problem: Coping: Goal: Level of anxiety will decrease Outcome: Progressing   Problem: Elimination: Goal: Will not experience complications related to bowel motility Outcome: Progressing Goal: Will not experience complications related to urinary retention Outcome: Progressing   Problem: Pain Managment: Goal: General experience of comfort will improve and/or be controlled Outcome: Progressing   Problem: Safety: Goal: Ability to remain free from injury will improve Outcome: Progressing   Problem: Skin Integrity: Goal: Risk for impaired skin integrity will decrease Outcome: Progressing

## 2024-06-27 NOTE — Inpatient Diabetes Management (Signed)
 Inpatient Diabetes Program Recommendations  AACE/ADA: New Consensus Statement on Inpatient Glycemic Control (2015)  Target Ranges:  Prepandial:   less than 140 mg/dL      Peak postprandial:   less than 180 mg/dL (1-2 hours)      Critically ill patients:  140 - 180 mg/dL    Latest Reference Range & Units 06/26/24 05:18 06/26/24 08:00 06/26/24 12:29 06/26/24 16:28 06/26/24 21:11  Glucose-Capillary 70 - 99 mg/dL 894 (H) 893 (H) 886 (H) 102 (H) 172 (H)  (H): Data is abnormally high  Latest Reference Range & Units 06/27/24 08:16  Glucose-Capillary 70 - 99 mg/dL 65 (L)  (L): Data is abnormally low     Home DM Meds: Metformin  1000 mg BID  Current Orders: Semglee  10 units daily     Novolog  Moderate Correction Scale/ SSI (0-15 units) TID AC + HS    MD- Note Hypoglycemia this AM  Please consider reducing the Semglee  to 5 units daily     --Will follow patient during hospitalization--  Adina Rudolpho Arrow RN, MSN, CDCES Diabetes Coordinator Inpatient Glycemic Control Team Team Pager: [phone] (8a-5p)

## 2024-06-27 NOTE — Progress Notes (Signed)
 PROGRESS NOTE  Earl [name] FMW:[id number] DOB: [birth year]   PCP: [name], MD  Patient is from: SNF  DOA: 03/04/2024 LOS: 115  Chief complaints No chief complaint on file.    Brief Narrative / Interim history: 83 year old M with PMH of PAF on Eliquis , HFmrEF, amyloid cardiomyopathy, NIDDM-2, CVA with right-sided weakness, stage IV sacral wound, pancytopenia, MGUS, HTN, HLD and hypothyroidism brought to ED by EMS from [facility] rehab due to pain in sacral area and shortness of breath 4 days after he was discharged from the hospital.   Patient was hospitalized from 3/26-4/10 with generalized weakness, encephalopathy and COVID-19 infection.  Per daughter, patient sustained skin injury during this hospitalization, and he has had significant pain, discomfort and progressive foul-smelling discharge from sacral wound over the course of 4 to 5 days before coming back to ED.  No report of constitutional symptoms, chest pain or dyspnea.  In ED, hypotensive to 82/51.  In RVR to 140s.  Na 130.  Glucose elevated to 500.  Not in DKA or HHS.  WBC 13.6 with left shift.  CXR without acute finding.  CT abdomen and pelvis ordered.  Cultures obtained.  Admitted with working diagnosis of sepsis due to sacral pressure ulcer wound infection, A-fib with RVR, hypotension.  Cultures obtained.  Patient was started on broad-spectrum antibiotics and IV fluid.  PCCM, cardiology and general surgery consulted.  See plan and assessment for more on hospital course.  Subjective: Seen and examined earlier this morning.  No major events overnight or this morning.  Complains of pain in his bottom.  Does not want to take oxycodone .  Mild hypoglycemia to 65 this morning.  Objective: Vitals:   06/26/24 2112 06/26/24 2307 06/27/24 0529 06/27/24 0821  BP: 115/68 130/83 98/70 114/67  Pulse: 79 65 75 (!) 57  Resp: 20 20  18   Temp: 97.8 F (36.6 C) (!) 97.4 F (36.3 C) (!) 97.4 F (36.3 C) 98 F (36.7 C)   TempSrc:      SpO2: 99% 99% 100% 100%  Weight:   62.3 kg   Height:        Examination:  GENERAL: No apparent distress.  Nontoxic. HEENT: MMM.  Vision and hearing grossly intact.  NECK: Supple.  No apparent JVD.  RESP: Some work of breathing as he speaks (unchanged). CVS:  RRR. Heart sounds normal.  ABD/GI/GU: BS+. Abd soft, NTND.  MSK/EXT:  Moves extremities.  Prevalon boots in place.  BLE weakness. SKIN: Sacral, left hip and right heel ulcers.  See pictures under media. NEURO: AA.  Oriented appropriately.  Difficult to understand speech partly due to lack of dentition and WOB as he speaks.  BLE weakness. PSYCH: Calm. Normal affect.   Consultants PCCM General Surgery Cardiology Infectious disease Plastic surgery Orthopedic surgery Palliative medicine  Microbiology 4/14-COVID-19 PCR positive.  Flu Enza and RSV PCR nonreactive 4/14-blood cultures NGTD 6/4-sacral wound culture with Pseudomonas aeruginosa and Klebsiella pneumoniae 7/14-pleural fluid culture NGTD   Assessment and plan: Severe sepsis due to sacral pressure wound infection: Present on arrival.  Presents with generalized weakness, poor appetite, hypotension pain and drainage from sacral wound.  Had leukocytosis, tachycardia, hypotension and lactic acidosis on presentation.    -Culture data as above. -4/14-CT showed sacral wound extension to the right gluteus muscle without signs of osteomyelitis. -4/15-underwent bedside debridement of sacral decubitus wound by general surgery -4/21-ID consulted and recommended continuing broad-spectrum antibiotics that he completed on 6/13. -6/16-CT-anasarca, new moderate pleural effusion and sacral decubitus with concern  for sacral osteomyelitis. -6/17- ID recommended continuing holding off antibiotics since he is HDS, afebrile without leukocytosis. -6/17-evaluated by plastic surgery.  Sacral wound felt to be clean.  Wound care recommended -7/13-7/13-CT showed similar  appearance of osteomyelitis underlying sacral decubitus ulcer.  -7/13-plastic surgery recommended for continued wound care and no surgical management. -7/14-Ortho evaluated patient for left hip wound.  Debulked and recommended optimizing nutrition -8/5- Surgery recommended hydrotherapy for left hip wound. Surgical debridement if not successful -8/5-wound care and instruction updated  -Optimize nutrition-see dietitian instruction below. -Overall, difficult case due to patient's nutritional status and limited mobility -Schedule Tylenol  every 6 hours while awake for pain - Oxycodone  2.5 mg every 4 hours as needed for severe pain and before dressing change    Chronic HFrEF/cardiac amyloidosis/NICM/hypotension: Hypotension resolved.  Recurrent pleural effusion and anasarca partly due to nutritional status and hypoalbuminemia.  Not on diuretics at home -Continue Tafamidis  and vutrisiran . -Continue with  low dose lasix    Recurrent bilateral pleural effusion/anasarca: Likely due to CHF, poor nutritional status/hypoalbuminemia.  Has some work of breathing as he speaks but saturating at 100% on RA.  Denies shortness of breath. -6/18-left-sided thoracentesis by IR.  Body fluid culture without any growth.   -7/14-bilateral thoracentesis by pulmonology.  Fluid transudative and culture negative -7/30-right thoracocentesis with removal of 500 mL of pleural fluid by IR.   -Remains on room air   IDDM-2 with hyperglycemia: A1c 8.4% on 4/3. Recent Labs  Lab 06/26/24 1628 06/26/24 2111 06/27/24 0816 06/27/24 0904 06/27/24 1203  GLUCAP 102* 172* 65* 75 182*  - Decrease SSI to sensitive - Crease Semglee  from 10 to 8 units daily.  PAF/flutter with RVR:   Now rate controlled. -Continue amiodarone  and Eliquis  Eliquis  -Cardiology signed off.   History of CVA: Stable -On Eliquis , Lipitor.    GERD epigastric pain: -Continue PPI   Anemia of chronic disease: Stable -Monitor intermittently    Hypothyroidism:  -Continue levothyroxine   Anxiety and adjustment disorder: -Psychiatry consulted on 7/7 and recommended continuing Zoloft  and Atarax .   Constipation: - Bowel regimen.    Goals of care: CODE STATUS DNR. 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  Palliative care consulted on 4/20.  Disposition: Per family, patient lives alone before prior hospitalization.  Family not interested in SNF or LTACH.  Also do not feel he is ready to be discharged until his wounds are down to stage II.   Severe malnutrition Body mass index is 19.16 kg/m. Nutrition Problem: Severe Malnutrition Etiology: chronic illness Signs/Symptoms: energy intake < or equal to 75% for > or equal to 1 month, severe muscle depletion Interventions: Ensure Enlive (each supplement provides 350kcal and 20 grams of protein), Magic cup, Hormel Shake, MVI, Juven, Other (Comment) (vitamin C )  Multiple pressure skin injuries: Pressure Injury 02/26/24 Coccyx Mid Stage 4 - Full thickness tissue loss with exposed bone, tendon or muscle. (Active)  02/26/24 0916  Location: Coccyx  Location Orientation: Mid  Staging: Stage 4 - Full thickness tissue loss with exposed bone, tendon or muscle.  Wound Description (Comments):   DO NOT USE:  Present on Admission: Yes  Dressing Type -- (Aquacell, mepilex) 06/27/24 0930     Pressure Injury 02/26/24 Toe (Comment  which one) Anterior;Right Deep Tissue Pressure Injury - Purple or maroon localized area of discolored intact skin or blood-filled blister due to damage of underlying soft tissue from pressure and/or shear. right gre (Active)  02/26/24 1215  Location:  Toe (Comment  which one)  Location Orientation: Anterior;Right  Staging: Deep Tissue Pressure Injury - Purple or maroon localized area of discolored intact skin or blood-filled blister due to damage of underlying soft tissue from  pressure and/or shear.  Wound Description (Comments): right great toe  DO NOT USE:  Present on Admission:      Pressure Injury 03/06/24 Hip Anterior;Left;Proximal Unstageable - Full thickness tissue loss in which the base of the injury is covered by slough (yellow, tan, gray, green or brown) and/or eschar (tan, brown or black) in the wound bed. unstageable (Active)  03/06/24 1612  Location: Hip  Location Orientation: Anterior;Left;Proximal  Staging: Unstageable - Full thickness tissue loss in which the base of the injury is covered by slough (yellow, tan, gray, green or brown) and/or eschar (tan, brown or black) in the wound bed.  Wound Description (Comments): unstageable  DO NOT USE:  Present on Admission: No  Dressing Type Other (Comment);Foam - Lift dressing to assess site every shift;Gauze (Comment);Santyl  (vashe, 2  Kling) 06/26/24 1700     Pressure Injury 03/06/24 Shoulder Left Unstageable - Full thickness tissue loss in which the base of the injury is covered by slough (yellow, tan, gray, green or brown) and/or eschar (tan, brown or black) in the wound bed. unstageable (Active)  03/06/24 1613  Location: Shoulder  Location Orientation: Left  Staging: Unstageable - Full thickness tissue loss in which the base of the injury is covered by slough (yellow, tan, gray, green or brown) and/or eschar (tan, brown or black) in the wound bed.  Wound Description (Comments): unstageable  DO NOT USE:  Present on Admission: No     Pressure Injury 04/23/24 Heel Right Unstageable - Full thickness tissue loss in which the base of the injury is covered by slough (yellow, tan, gray, green or brown) and/or eschar (tan, brown or black) in the wound bed. DTPI evolving into unstageable (Active)  04/23/24   Location: Heel  Location Orientation: Right  Staging: Unstageable - Full thickness tissue loss in which the base of the injury is covered by slough (yellow, tan, gray, green or brown) and/or eschar (tan,  brown or black) in the wound bed.  Wound Description (Comments): DTPI evolving into unstageable  DO NOT USE:  Present on Admission: No     Pressure Injury 04/23/24 Heel Left Deep Tissue Pressure Injury - Purple or maroon localized area of discolored intact skin or blood-filled blister due to damage of underlying soft tissue from pressure and/or shear. (Active)  04/23/24   Location: Heel  Location Orientation: Left  Staging: Deep Tissue Pressure Injury - Purple or maroon localized area of discolored intact skin or blood-filled blister due to damage of underlying soft tissue from pressure and/or shear.  Wound Description (Comments):   DO NOT USE:  Present on Admission: No   DVT prophylaxis:  SCDs Start: 03/04/24 2151 Place TED hose Start: 03/04/24 2151 apixaban  (ELIQUIS ) tablet 5 mg  Code Status: DNR Family Communication: None at bedside today. Level of care: Telemetry Medical Status is: Inpatient Remains inpatient appropriate because: Complicated disposition   Final disposition: Yet to be determined   35 minutes with more than 50% spent in reviewing records, counseling patient/family and coordinating care.   Sch Meds:  Scheduled Meds:  (feeding supplement) PROSource Plus  30 mL Oral BID BM   acetaminophen   650 mg Oral Q8H   amiodarone   200 mg Oral Daily   apixaban   5 mg Oral BID   atorvastatin   40  mg Oral Daily   calcium  carbonate  1 tablet Oral TID   collagenase    Topical Daily   diclofenac  Sodium  4 g Topical QID   feeding supplement  237 mL Oral BID BM   ferrous sulfate   325 mg Oral QHS   furosemide   20 mg Oral Daily   guaiFENesin   600 mg Oral BID   insulin  aspart  0-5 Units Subcutaneous QHS   insulin  aspart  0-9 Units Subcutaneous TID WC   [START ON 06/28/2024] insulin  glargine-yfgn  8 Units Subcutaneous Daily   levothyroxine   50 mcg Oral Q0600   lidocaine   1 patch Transdermal Q24H   liver oil-zinc  oxide   Topical BID   multivitamin with minerals  1 tablet Oral Daily    nutrition supplement (JUVEN)  1 packet Oral BID BM   mouth rinse  15 mL Mouth Rinse 4 times per day   pantoprazole   40 mg Oral Daily   polyethylene glycol  17 g Oral BID   potassium chloride   20 mEq Oral Daily   senna-docusate  1 tablet Oral QHS   sertraline   50 mg Oral Daily   sodium chloride  flush  10-40 mL Intracatheter Q12H   Tafamidis   1 capsule Oral Daily   vitamin A   10,000 Units Oral Daily   vutrisiran  sodium  25 mg Subcutaneous Q90 days   Continuous Infusions:  dextrose      PRN Meds:.bismuth  subsalicylate, butalbital -acetaminophen -caffeine , dextrose , food thickener, hydrOXYzine , lip balm, meclizine , ondansetron  **OR** ondansetron  (ZOFRAN ) IV, mouth rinse, oxyCODONE , artificial tears  Antimicrobials: Anti-infectives (From admission, onward)    Start     Dose/Rate Route Frequency Ordered Stop   04/27/24 1615  cefTAZidime  (FORTAZ ) 2 g in sodium chloride  0.9 % 100 mL IVPB        2 g 200 mL/hr over 30 Minutes Intravenous Every 8 hours 04/27/24 1518 05/03/24 2359   03/16/24 1015  cefTRIAXone  (ROCEPHIN ) 2 g in sodium chloride  0.9 % 100 mL IVPB        2 g 200 mL/hr over 30 Minutes Intravenous Every 24 hours 03/16/24 0929 03/20/24 1219   03/16/24 1015  doxycycline  (VIBRA -TABS) tablet 100 mg        100 mg Oral Every 12 hours 03/16/24 0929 03/20/24 2131   03/14/24 1000  metroNIDAZOLE  (FLAGYL ) tablet 500 mg        500 mg Oral Every 12 hours 03/14/24 0803 03/19/24 2219   03/13/24 1000  cefadroxil  (DURICEF) capsule 1,000 mg  Status:  Discontinued        1,000 mg Oral 2 times daily 03/12/24 1632 03/16/24 0929   03/07/24 2200  metroNIDAZOLE  (FLAGYL ) tablet 500 mg        500 mg Oral Every 12 hours 03/07/24 0840 03/11/24 2258   03/05/24 1930  vancomycin  (VANCOREADY) IVPB 1250 mg/250 mL  Status:  Discontinued        1,250 mg 166.7 mL/hr over 90 Minutes Intravenous Every 24 hours 03/04/24 2205 03/04/24 2224   03/05/24 1930  Vancomycin  (VANCOCIN ) 1,250 mg in sodium chloride  0.9 % 250 mL  IVPB  Status:  Discontinued        1,250 mg 166.7 mL/hr over 90 Minutes Intravenous Every 24 hours 03/04/24 2224 03/05/24 0717   03/05/24 1930  vancomycin  (VANCOREADY) IVPB 1500 mg/300 mL  Status:  Discontinued        1,500 mg 150 mL/hr over 120 Minutes Intravenous Every 24 hours 03/05/24 0717 03/12/24 1636   03/05/24 1400  ceFEPIme  (MAXIPIME ) 2 g  in sodium chloride  0.9 % 100 mL IVPB  Status:  Discontinued        2 g 200 mL/hr over 30 Minutes Intravenous Every 8 hours 03/05/24 0717 03/12/24 1632   03/05/24 0600  ceFEPIme  (MAXIPIME ) 2 g in sodium chloride  0.9 % 100 mL IVPB  Status:  Discontinued        2 g 200 mL/hr over 30 Minutes Intravenous Every 12 hours 03/04/24 2205 03/05/24 0717   03/05/24 0000  metroNIDAZOLE  (FLAGYL ) IVPB 500 mg  Status:  Discontinued        500 mg 100 mL/hr over 60 Minutes Intravenous Every 12 hours 03/04/24 2206 03/04/24 2209   03/04/24 1730  aztreonam (AZACTAM) 2 g in sodium chloride  0.9 % 100 mL IVPB  Status:  Discontinued        2 g 200 mL/hr over 30 Minutes Intravenous  Once 03/04/24 1715 03/04/24 1724   03/04/24 1730  metroNIDAZOLE  (FLAGYL ) IVPB 500 mg  Status:  Discontinued        500 mg 100 mL/hr over 60 Minutes Intravenous Every 12 hours 03/04/24 1715 03/07/24 0840   03/04/24 1730  vancomycin  (VANCOREADY) IVPB 1500 mg/300 mL        1,500 mg 150 mL/hr over 120 Minutes Intravenous  Once 03/04/24 1715 03/04/24 2153   03/04/24 1730  ceFEPIme  (MAXIPIME ) 2 g in sodium chloride  0.9 % 100 mL IVPB        2 g 200 mL/hr over 30 Minutes Intravenous  Once 03/04/24 1724 03/04/24 1826        I have personally reviewed the following labs and images: CBC: Recent Labs  Lab 06/21/24 0304  WBC 5.4  HGB 12.5*  HCT 36.9*  MCV 85.6  PLT 306   BMP &GFR Recent Labs  Lab 06/21/24 0304  NA 139  K 4.9  CL 108  CO2 22  GLUCOSE 110*  BUN 29*  CREATININE 1.00  CALCIUM  8.8*   Estimated Creatinine Clearance: 49.3 mL/min (by C-G formula based on SCr of 1  mg/dL). Liver & Pancreas: No results for input(s): AST, ALT, ALKPHOS, BILITOT, PROT, ALBUMIN  in the last 168 hours. No results for input(s): LIPASE, AMYLASE in the last 168 hours. No results for input(s): AMMONIA in the last 168 hours. Diabetic: No results for input(s): HGBA1C in the last 72 hours. Recent Labs  Lab 06/26/24 1628 06/26/24 2111 06/27/24 0816 06/27/24 0904 06/27/24 1203  GLUCAP 102* 172* 65* 75 182*   Cardiac Enzymes: No results for input(s): CKTOTAL, CKMB, CKMBINDEX, TROPONINI in the last 168 hours. No results for input(s): PROBNP in the last 8760 hours. Coagulation Profile: No results for input(s): INR, PROTIME in the last 168 hours. Thyroid  Function Tests: No results for input(s): TSH, T4TOTAL, FREET4, T3FREE, THYROIDAB in the last 72 hours. Lipid Profile: No results for input(s): CHOL, HDL, LDLCALC, TRIG, CHOLHDL, LDLDIRECT in the last 72 hours. Anemia Panel: No results for input(s): VITAMINB12, FOLATE, FERRITIN, TIBC, IRON, RETICCTPCT in the last 72 hours. Urine analysis:    Component Value Date/Time   COLORURINE STRAW (A) 03/04/2024 2139   APPEARANCEUR CLEAR 03/04/2024 2139   LABSPEC 1.016 03/04/2024 2139   PHURINE 6.0 03/04/2024 2139   GLUCOSEU >=500 (A) 03/04/2024 2139   HGBUR SMALL (A) 03/04/2024 2139   BILIRUBINUR NEGATIVE 03/04/2024 2139   KETONESUR NEGATIVE 03/04/2024 2139   PROTEINUR 30 (A) 03/04/2024 2139   UROBILINOGEN 0.2 05/05/2015 1443   NITRITE NEGATIVE 03/04/2024 2139   LEUKOCYTESUR NEGATIVE 03/04/2024 2139   Sepsis Labs:  Invalid input(s): PROCALCITONIN, LACTICIDVEN  Microbiology: No results found for this or any previous visit (from the past 240 hours).  Radiology Studies: No results found.    Jisel [name] T. Rayen Palen [name]  If 7PM-7AM, please contact night-coverage [email or web address] 06/27/2024, 1:34 PM

## 2024-06-28 DIAGNOSIS — R6520 Severe sepsis without septic shock: Secondary | ICD-10-CM | POA: Diagnosis not present

## 2024-06-28 DIAGNOSIS — A419 Sepsis, unspecified organism: Secondary | ICD-10-CM | POA: Diagnosis not present

## 2024-06-28 LAB — GLUCOSE, CAPILLARY
Glucose-Capillary: 97 mg/dL (ref 70–99)
Glucose-Capillary: 115 mg/dL — ABNORMAL HIGH (ref 70–99)
Glucose-Capillary: 214 mg/dL — ABNORMAL HIGH (ref 70–99)
Glucose-Capillary: 158 mg/dL — ABNORMAL HIGH (ref 70–99)

## 2024-06-28 MED ORDER — ACETAMINOPHEN 325 MG PO TABS
650.0000 mg | ORAL_TABLET | Freq: Two times a day (BID) | ORAL | Status: DC | PRN
  Administered 2024-07-01 (×2): 650 mg via ORAL
  Filled 2024-06-28 (×2): qty 2

## 2024-06-28 NOTE — Plan of Care (Signed)
  Problem: Fluid Volume: Goal: Hemodynamic stability will improve Outcome: Progressing   Problem: Clinical Measurements: Goal: Diagnostic test results will improve Outcome: Progressing Goal: Signs and symptoms of infection will decrease Outcome: Progressing   Problem: Education: Goal: Knowledge of risk factors and measures for prevention of condition will improve Outcome: Progressing   Problem: Coping: Goal: Psychosocial and spiritual needs will be supported Outcome: Progressing   Problem: Respiratory: Goal: Will maintain a patent airway Outcome: Progressing Goal: Complications related to the disease process, condition or treatment will be avoided or minimized Outcome: Progressing   Problem: Education: Goal: Ability to describe self-care measures that may prevent or decrease complications (Diabetes Survival Skills Education) will improve Outcome: Progressing   Problem: Coping: Goal: Ability to adjust to condition or change in health will improve Outcome: Progressing   Problem: Fluid Volume: Goal: Ability to maintain a balanced intake and output will improve Outcome: Progressing   Problem: Health Behavior/Discharge Planning: Goal: Ability to manage health-related needs will improve Outcome: Progressing   Problem: Metabolic: Goal: Ability to maintain appropriate glucose levels will improve Outcome: Progressing   Problem: Nutritional: Goal: Maintenance of adequate nutrition will improve Outcome: Progressing Goal: Progress toward achieving an optimal weight will improve Outcome: Progressing   Problem: Skin Integrity: Goal: Risk for impaired skin integrity will decrease Outcome: Progressing   Problem: Tissue Perfusion: Goal: Adequacy of tissue perfusion will improve Outcome: Progressing   Problem: Education: Goal: Knowledge of General Education information will improve Description: Including pain rating scale, medication(s)/side effects and non-pharmacologic  comfort measures Outcome: Progressing   Problem: Health Behavior/Discharge Planning: Goal: Ability to manage health-related needs will improve Outcome: Progressing   Problem: Clinical Measurements: Goal: Ability to maintain clinical measurements within normal limits will improve Outcome: Progressing Goal: Will remain free from infection Outcome: Progressing Goal: Diagnostic test results will improve Outcome: Progressing Goal: Respiratory complications will improve Outcome: Progressing Goal: Cardiovascular complication will be avoided Outcome: Progressing   Problem: Activity: Goal: Risk for activity intolerance will decrease Outcome: Progressing   Problem: Nutrition: Goal: Adequate nutrition will be maintained Outcome: Progressing   Problem: Coping: Goal: Level of anxiety will decrease Outcome: Progressing   Problem: Elimination: Goal: Will not experience complications related to bowel motility Outcome: Progressing Goal: Will not experience complications related to urinary retention Outcome: Progressing   Problem: Pain Managment: Goal: General experience of comfort will improve and/or be controlled Outcome: Progressing   Problem: Safety: Goal: Ability to remain free from injury will improve Outcome: Progressing   Problem: Skin Integrity: Goal: Risk for impaired skin integrity will decrease Outcome: Progressing

## 2024-06-28 NOTE — Progress Notes (Signed)
 [facility] PROGRESS NOTE    Progress Note  Earl [name]  FMW:[id number] DOB: 10/09/1941 DOA: 03/04/2024 PCP: [name], MD     Brief Narrative:   Earl [name] is an 83 y.o. 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  Critical care was consulted for persistent hypotension.  Respiratory panel was negative.  General surgery was consulted for sacral wound debridement with bedside excision on 03/05/2024 and 03/07/2024.  He is now hemodynamically stable for discharge.  Assessment/Plan:  Severe sepsis secondary to chronic infected pressure ulcers stage III: With pain and hypotension on admission. CT scan of the abdomen pelvis shows sacral decubitus ulcer extending to the right gluteus muscle with no evidence of osteomyelitis. Seen by PCCM started empirically on antibiotics which she has completed his course on 03/21/2024. General surgery is on board he underwent bedside excisional debridement on 03/05/2024 and 03/07/2024. General surgery recommended no further debridement. ID was consulted to continue complete antibiotic treatment in house. Patient is medically stable to be transferred to skilled nursing facility. Overall difficult case patient nutritional status is limited by mobility. Continue Tylenol  and oxycodone  as needed.  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 06/02/2024 CT shows similar appearance of osteomyelitis of underlying sacral decubitus  ulcer, plastic surgery was reconsulted they recommended wound care and nonsurgical management. 06/03/2024 Ortho consulted and evaluated the left hip recommended optimization of nutrition. 06/25/2024 surgery recommended hydrotherapy for left hip wound if unsuccessful then they will evaluate for surgical debridement. 06/25/2024 wound care instructions updated. Continue to optimize nutrition. Currently on no overlay mattress. Turn every 2 hours out of bed to chair, try if possible to set him up in the chair daily. He has been eating about 20 to 50% of his meals. Ensure 3 times daily.  Chronic HFrEF/cardiac amyloid/nonischemic cardiomyopathy: Recurrent pleural effusions anasarca partially due to nutritional status. Not on a diuretic at home. Continue low-dose Lasix  Continue Tafamidis  and vutrisiran .   Recurrent bilateral pleural effusions: Likely due to heart failure and hypoalbuminemia/protein caloric malnutrition. 05/08/2024 left-sided thoracocentesis by IR without any growth. 06/03/2024 bilateral thoracocentesis by pulmonary culture negative. 06/19/2024 right thoracocentesis by IR. Patient remains on room air.  History of paroxysmal atrial fibrillation [facility]) Cardiology is consulted currently on amiodarone  and Eliquis . Patient has had intermittent bouts of A-fib but remains asymptomatic now rate controlled. Daughter does not want to take off the telemetry.  Left lower lobe pneumonia: Completed course of antibiotics.  Insulin -dependent diabetes mellitus type 2: With an A1c of 8.4. Continue long-acting insulin  plus sliding scale.  GERD: Stable.  Anemia of chronic disease: Status post 1 unit packed red blood cells last hemoglobin was around 9.  Hypothyroidism: Synthroid .  Constipation: Started on MiraLAX  p.o. twice daily  Stage 4 sacral decubitus ulcer present on admission RN Pressure Injury Documentation: Pressure Injury 02/26/24 Coccyx Mid Stage 4 - Full thickness tissue loss  with exposed bone, tendon or muscle. (Active)  02/26/24 0916  Location: Coccyx  Location Orientation: Mid  Staging: Stage 4 - Full thickness tissue loss with exposed bone, tendon or muscle.  Wound Description (Comments):  DO NOT USE:  Present on Admission: Yes  Dressing Type Alginate;Other (Comment) 06/25/24 1921     Pressure Injury 02/26/24 Toe (Comment  which one) Anterior;Right Deep Tissue Pressure Injury - Purple or maroon localized area of discolored intact skin or blood-filled blister due to damage of underlying soft tissue from pressure and/or shear. right gre (Active)  02/26/24 1215  Location: Toe (Comment  which one)  Location Orientation: Anterior;Right  Staging: Deep Tissue Pressure Injury - Purple or maroon localized area of discolored intact skin or blood-filled blister due to damage of underlying soft tissue from pressure and/or shear.  Wound Description (Comments): right great toe  DO NOT USE:  Present on Admission:   Dressing Type None 06/22/24 0635     Pressure Injury 03/06/24 Hip Anterior;Left;Proximal Unstageable - Full thickness tissue loss in which the base of the injury is covered by slough (yellow, tan, gray, green or brown) and/or eschar (tan, brown or black) in the wound bed. unstageable (Active)  03/06/24 1612  Location: Hip  Location Orientation: Anterior;Left;Proximal  Staging: Unstageable - Full thickness tissue loss in which the base of the injury is covered by slough (yellow, tan, gray, green or brown) and/or eschar (tan, brown or black) in the wound bed.  Wound Description (Comments): unstageable  DO NOT USE:  Present on Admission: No  Dressing Type Other (Comment);Foam - Lift dressing to assess site every shift;Gauze (Comment);Santyl  06/26/24 1700     Pressure Injury 03/06/24 Shoulder Left Unstageable - Full thickness tissue loss in which the base of the injury is covered by slough (yellow, tan, gray, green or brown) and/or eschar (tan, brown or black) in  the wound bed. unstageable (Active)  03/06/24 1613  Location: Shoulder  Location Orientation: Left  Staging: Unstageable - Full thickness tissue loss in which the base of the injury is covered by slough (yellow, tan, gray, green or brown) and/or eschar (tan, brown or black) in the wound bed.  Wound Description (Comments): unstageable  DO NOT USE:  Present on Admission: No  Dressing Type Other (Comment) 06/25/24 0935     Pressure Injury 04/23/24 Heel Right Unstageable - Full thickness tissue loss in which the base of the injury is covered by slough (yellow, tan, gray, green or brown) and/or eschar (tan, brown or black) in the wound bed. DTPI evolving into unstageable (Active)  04/23/24   Location: Heel  Location Orientation: Right  Staging: Unstageable - Full thickness tissue loss in which the base of the injury is covered by slough (yellow, tan, gray, green or brown) and/or eschar (tan, brown or black) in the wound bed.  Wound Description (Comments): DTPI evolving into unstageable  DO NOT USE:  Present on Admission: No  Dressing Type Other (Comment) 06/25/24 0935     Pressure Injury 04/23/24 Heel Left Deep Tissue Pressure Injury - Purple or maroon localized area of discolored intact skin or blood-filled blister due to damage of underlying soft tissue from pressure and/or shear. (Active)  04/23/24   Location: Heel  Location Orientation: Left  Staging: Deep Tissue Pressure Injury - Purple or maroon localized area of discolored intact skin or blood-filled blister due to damage of underlying soft tissue from pressure and/or shear.  Wound Description (Comments):   DO NOT USE:  Present on Admission: No  Dressing Type Other (Comment) 06/25/24 0935   Severe protein malnutrition/suspicious for dysphagia: Speech was consulted, continue nutritional supplements. Patient on a pured diet albumin  appears to be improving. Continue Ensure.  DVT prophylaxis: lovenox  Family  Communication:none Status  is: Inpatient Remains inpatient appropriate because: Awaiting skilled nursing facility placement.    Code Status:     Code Status Orders  (From admission, onward)           Start     Ordered   03/04/24 2149  Do not attempt resuscitation (DNR)- Limited -Do Not Intubate (DNI)  Continuous       Question Answer Comment  If pulseless and not breathing No CPR or chest compressions.   In Pre-Arrest Conditions (Patient Is Breathing and Has A Pulse) Do not intubate. Provide all appropriate non-invasive medical interventions. Avoid ICU transfer unless indicated or required.   Consent: Discussion documented in EHR or advanced directives reviewed      03/04/24 2150           Code Status History     Date Active Date Inactive Code Status Order ID Comments User Context   02/25/2024 1652 02/29/2024 1738 Limited: Do not attempt resuscitation (DNR) -DNR-LIMITED -Do Not Intubate/DNI  [id number]  Claudene Jeoffrey HERO, NP Inpatient   02/15/2024 0814 02/25/2024 1652 Full Code [id number]  Arlice Reichert, MD ED   11/14/2023 2326 11/16/2023 2302 Full Code [id number]  Lee Kingfisher, MD ED   10/07/2021 1136 10/07/2021 1942 Full Code [id number]  Rolan Ezra RAMAN, MD Inpatient   07/28/2017 1834 08/08/2017 1437 Full Code [id number]  Maurice Sharlet RAMAN, PA-C Inpatient   07/28/2017 1834 07/28/2017 1834 Full Code [id number]  Maurice Sharlet RAMAN, PA-C Inpatient   07/26/2017 0109 07/28/2017 1828 Full Code [id number]  Pearlean Tully BRAVO, MD Inpatient         IV Access:   Peripheral IV   Procedures and diagnostic studies:   No results found.   Medical Consultants:   None.   Subjective:    Earl [name] no complaints  Objective:    Vitals:   06/27/24 0821 06/27/24 2006 06/28/24 0033 06/28/24 0439  BP: 114/67 116/65 105/75 128/76  Pulse: (!) 57 75 69 77  Resp: 18  18 20   Temp: 98 F (36.7 C) 97.6 F (36.4 C) (!) 97.5 F (36.4 C) 98.6 F (37 C)  TempSrc:  Oral Oral   SpO2: 100% 99% 100% 100%  Weight:       Height:       SpO2: 100 % FiO2 (%): (!) 19 %   Intake/Output Summary (Last 24 hours) at 06/28/2024 0919 Last data filed at 06/27/2024 1714 Gross per 24 hour  Intake 355 ml  Output 800 ml  Net -445 ml   Filed Weights   06/13/24 0128 06/26/24 0500 06/27/24 0529  Weight: 63.5 kg 62.3 kg 62.3 kg    Exam: General exam: In no acute distress. Respiratory system: Good air movement and clear to auscultation. Cardiovascular system: S1 & S2 heard, RRR. No JVD. Gastrointestinal system: Abdomen is nondistended, soft and nontender.  Skin: No rashes, lesions or ulcers   Data Reviewed:    Labs: Basic Metabolic Panel: No results for input(s): NA, K, CL, CO2, GLUCOSE, BUN, CREATININE, CALCIUM , MG, PHOS in the last 168 hours.  GFR Estimated Creatinine Clearance: 49.3 mL/min (by C-G formula based on SCr of 1 mg/dL). Liver Function Tests: No results for input(s): AST, ALT, ALKPHOS, BILITOT, PROT, ALBUMIN  in the last 168 hours.  No results for input(s): LIPASE, AMYLASE in the last 168 hours. No results for input(s): AMMONIA in the last 168 hours. Coagulation profile No results for input(s): INR, PROTIME in the last 168 hours. COVID-19 Labs  No results for  input(s): DDIMER, FERRITIN, LDH, CRP in the last 72 hours.  Lab Results  Component Value Date   SARSCOV2NAA POSITIVE (A) 03/04/2024   SARSCOV2NAA POSITIVE (A) 02/14/2024   SARSCOV2NAA NEGATIVE 06/07/2019    CBC: No results for input(s): WBC, NEUTROABS, HGB, HCT, MCV, PLT in the last 168 hours.  Cardiac Enzymes: No results for input(s): CKTOTAL, CKMB, CKMBINDEX, TROPONINI in the last 168 hours. BNP (last 3 results) No results for input(s): PROBNP in the last 8760 hours. CBG: Recent Labs  Lab 06/27/24 1203 06/27/24 1605 06/27/24 2104 06/27/24 2333 06/28/24 0814  GLUCAP 182* 188* 192* 167* 97   D-Dimer: No results for input(s): DDIMER in the last 72  hours. Hgb A1c: No results for input(s): HGBA1C in the last 72 hours. Lipid Profile: No results for input(s): CHOL, HDL, LDLCALC, TRIG, CHOLHDL, LDLDIRECT in the last 72 hours. Thyroid  function studies: No results for input(s): TSH, T4TOTAL, T3FREE, THYROIDAB in the last 72 hours.  Invalid input(s): FREET3 Anemia work up: No results for input(s): VITAMINB12, FOLATE, FERRITIN, TIBC, IRON, RETICCTPCT in the last 72 hours. Sepsis Labs: No results for input(s): PROCALCITON, WBC, LATICACIDVEN in the last 168 hours.  Microbiology No results found for this or any previous visit (from the past 240 hours).   Medications:    (feeding supplement) PROSource Plus  30 mL Oral BID BM   acetaminophen   650 mg Oral Q8H   amiodarone   200 mg Oral Daily   apixaban   5 mg Oral BID   atorvastatin   40 mg Oral Daily   calcium  carbonate  1 tablet Oral TID   collagenase    Topical Daily   diclofenac  Sodium  4 g Topical QID   feeding supplement  237 mL Oral BID BM   ferrous sulfate   325 mg Oral QHS   furosemide   20 mg Oral Daily   guaiFENesin   600 mg Oral BID   insulin  aspart  0-5 Units Subcutaneous QHS   insulin  aspart  0-9 Units Subcutaneous TID WC   insulin  glargine-yfgn  8 Units Subcutaneous Daily   levothyroxine   50 mcg Oral Q0600   lidocaine   1 patch Transdermal Q24H   liver oil-zinc  oxide   Topical BID   multivitamin with minerals  1 tablet Oral Daily   nutrition supplement (JUVEN)  1 packet Oral BID BM   mouth rinse  15 mL Mouth Rinse 4 times per day   pantoprazole   40 mg Oral Daily   polyethylene glycol  17 g Oral BID   potassium chloride   20 mEq Oral Daily   senna-docusate  1 tablet Oral QHS   sertraline   50 mg Oral Daily   sodium chloride  flush  10-40 mL Intracatheter Q12H   Tafamidis   1 capsule Oral Daily   vitamin A   10,000 Units Oral Daily   vutrisiran  sodium  25 mg Subcutaneous Q90 days   Continuous Infusions:  dextrose         LOS:  116 days   Earl [name]  [facility]  06/28/2024, 9:19 AM

## 2024-06-28 NOTE — Progress Notes (Signed)
 Physical Therapy Wound Treatment Patient Details  Name: Earl [name] MRN: [id number] Date of Birth: June 11, 1941  Today's Date: 06/28/2024 Time: 0816-0858 Time Calculation (min): 42 min  Subjective  Subjective Assessment Subjective: WOC RN in room for assessment prior to PT wound session Patient and Family Stated Goals: Heal wounds Date of Onset:  (unknown) Prior Treatments: Dressing changes, off weighting  Pain Score:  Faces 4/10, pt refusing pain medication.   Wound Assessment  Pressure Injury 03/06/24 Hip Anterior;Left;Proximal Unstageable - Full thickness tissue loss in which the base of the injury is covered by slough (yellow, tan, gray, green or brown) and/or eschar (tan, brown or black) in the wound bed. unstageable (Active)  Wound Image   06/26/24 1700  Dressing Type Other (Comment);Foam - Lift dressing to assess site every shift;Gauze (Comment);Santyl  06/28/24 0900  Dressing Clean, Dry, Intact 06/28/24 0900  Dressing Change Frequency Daily 06/28/24 0900  State of Healing Non-healing 06/28/24 0900  Site / Wound Assessment Yellow 06/28/24 0900  % Wound base Red or Granulating 5% 06/28/24 0900  % Wound base Yellow/Fibrinous Exudate 95% 06/28/24 0900  % Wound base Black/Eschar 0% 06/28/24 0900  % Wound base Other/Granulation Tissue (Comment) 0% 06/28/24 0900  Peri-wound Assessment Denuded;Induration;Pink;Erythema (blanchable) 06/28/24 0900  Wound Length (cm) 4.1 cm 06/26/24 1700  Wound Width (cm) 3.2 cm 06/26/24 1700  Wound Depth (cm) 3.2 cm 06/26/24 1700  Wound Surface Area (cm^2) 13.12 cm^2 06/26/24 1700  Wound Volume (cm^3) 41.98 cm^3 06/26/24 1700  Tunneling (cm) 0 05/17/24 1457  Undermining (cm) 12 o'clock 0.5, 1 o'clock 1.6, 2 o'clock 1.1, 3 o'clock 0.5, 4 o'clock 0.6 06/26/24 1700  Margins Unattached edges (unapproximated) 06/28/24 0900  Drainage Amount Moderate 06/28/24 0900  Drainage Description Serosanguineous;Odor - foul 06/28/24 0900  Treatment  Cleansed;Debridement (Selective);Irrigation 06/28/24 0900      Selective Debridement (non-excisional) Selective Debridement (non-excisional) - Location: L hip Selective Debridement (non-excisional) - Tools Used: Forceps, Scalpel, Scissors Selective Debridement (non-excisional) - Tissue Removed: Slough & non-viable adipose tissue    Wound Assessment and Plan  Wound Therapy - Assess/Plan/Recommendations Wound Therapy - Clinical Statement: Wound bed has softened with application of Santyl  and an appreciable amount of slough and necrotic tissue was removed, however base of wound bed has not yet been visualized. Pt will continue to benefit from selective debridement to reduce bioburden and promote wound healing Wound Therapy - Functional Problem List: Decreased tolerance for position changes or OOB. Factors Delaying/Impairing Wound Healing: Diabetes Mellitus, Immobility, Infection - systemic/local, Multiple medical problems Hydrotherapy Plan: Debridement, Dressing change, Patient/family education Wound Therapy - Frequency: 2X / week Wound Therapy - Follow Up Recommendations: dressing changes by RN  Wound Therapy Goals- Improve the function of patient's integumentary system by progressing the wound(s) through the phases of wound healing (inflammation - proliferation - remodeling) by: Wound Therapy Goals - Improve the function of patient's integumentary system by progressing the wound(s) through the phases of wound healing by: Decrease Necrotic Tissue to: 20% Decrease Necrotic Tissue - Progress: Progressing toward goal Increase Granulation Tissue to: 80% Increase Granulation Tissue - Progress: Progressing toward goal Goals/treatment plan/discharge plan were made with and agreed upon by patient/family: Yes Time For Goal Achievement: 7 days Wound Therapy - Potential for Goals: Fair  Goals will be updated until maximal potential achieved or discharge criteria met.  Discharge criteria: when goals  achieved, discharge from hospital, MD decision/surgical intervention, no progress towards goals, refusal/missing three consecutive treatments without notification or medical reason.  GP     Charges  PT Wound Care Charges $Wound Debridement up to 20 cm: < or equal to 20 cm $PT Hydrotherapy Dressing: 2 dressings $PT Hydrotherapy Visit: 1 Visit     Aryka Coonradt B. [name] PT, DPT Acute Rehabilitation Services Please use secure chat or  Call Office [phone]   [name] [facility] 06/28/2024, 9:29 AM

## 2024-06-28 NOTE — Progress Notes (Signed)
 Physical Therapy Treatment Patient Details Name: Earl [name] MRN: [id number] DOB: 12-27-1940 Today's Date: 06/28/2024   History of Present Illness 83 y.o. male admitted from [facility] rehab on 03/04/24 with SOB, sacral pain, Afib with RVR. Pt with acute metabolic encephalopathy, rhabdomyolysis and sepsis secondary to sacral wound infection. 4/15 & 4/17 bedside sacral wound debridement. Pt also with B pleural effusion, 6/18 L thoracentesis, 7/14 L & R thoracentesis.  PMhx: adm 3/26-4/08/2024 with fall, AMS, COVID; d/c to SNF rehab. CVA (2021, residual R-side weakness), T2DM, HTN, CHF, MGUS, Afib on Eliquis .    PT Comments  Pt asleep on entry, wakes easily but is apprehensive about therapy. Ultimately agreeable. Pt shows good command follow and sequencing with coming to the EoB with only modA. Pt able to come to standing in [place] x3 with modAx2. While in standing pt worked on upright posture and even weight distribution between his LE. Pt requires maxAx2 for returning to supine for hydrotherapy. Pt making progress towards his goal. D/c plan remains appropriate at this time.     If plan is discharge home, recommend the following: Two people to help with walking and/or transfers;Two people to help with bathing/dressing/bathroom;Assistance with feeding;Assist for transportation   Can travel by private vehicle     No  Equipment Recommendations  Hospital bed;Hoyer lift;Wheelchair cushion (measurements PT);Wheelchair (measurements PT) (air mattress, Roho cushion)       Precautions / Restrictions Precautions Precautions: Fall Recall of Precautions/Restrictions: Impaired Precaution/Restrictions Comments: sacral wound; L hip wound with wound vac, bilat heel wounds; L shoulder wound Restrictions Weight Bearing Restrictions Per Provider Order: No     Mobility  Bed Mobility Overal bed mobility: Needs Assistance Bed Mobility: Supine to Sit, Sit to Supine, Rolling     Supine to sit: Mod  assist, HOB elevated, Used rails Sit to supine: Max assist, +2 for safety/equipment   General bed mobility comments: with HoB elevated, and maximal cuing pt is navigate off bed and reach across body to bedrail, pt requiring modA for pad scoot of hips to EoB and bringing trunk to upright    Transfers Overall transfer level: Needs assistance Equipment used: Ambulation equipment used Transfers: Sit to/from Stand Sit to Stand: From elevated surface, Via lift equipment, Mod assist           General transfer comment: ModAx2 to boost up to standing with Stedy, verbal cues for bilateral quad and glute activation, able to achieve x3        Balance Overall balance assessment: Needs assistance Sitting-balance support: Single extremity supported, Bilateral upper extremity supported, Feet supported Sitting balance-Leahy Scale: Poor Sitting balance - Comments: Posterior lean   Standing balance support: Bilateral upper extremity supported Standing balance-Leahy Scale: Poor Standing balance comment: Reliant on UE support on stedy and min-modA for balance                            Communication Communication Communication: Impaired Factors Affecting Communication: Reduced clarity of speech;Difficulty expressing self  Cognition Arousal: Alert Behavior During Therapy: WFL for tasks assessed/performed   PT - Cognitive impairments: Problem solving Difficult to assess due to: Impaired communication                       Following commands: Impaired Following commands impaired: Follows one step commands with increased time    Cueing Cueing Techniques: Verbal cues, Tactile cues, Visual cues     General Comments General  comments (skin integrity, edema, etc.): VSS on RA, pt set up for hydrotherapy treatment at end of PT session      Pertinent Vitals/Pain Pain Assessment Pain Assessment: Faces Faces Pain Scale: Hurts little more Pain Location: generalized, refuses  pain medication Pain Descriptors / Indicators: Discomfort, Grimacing Pain Intervention(s): Limited activity within patient's tolerance, Monitored during session, Repositioned     PT Goals (current goals can now be found in the care plan section) Acute Rehab PT Goals Patient Stated Goal: move better, go home PT Goal Formulation: With patient Time For Goal Achievement: 07/10/24 Potential to Achieve Goals: Fair Progress towards PT goals: Progressing toward goals    Frequency    Min 1X/week       AM-PAC PT 6 Clicks Mobility   Outcome Measure  Help needed turning from your back to your side while in a flat bed without using bedrails?: A Lot Help needed moving from lying on your back to sitting on the side of a flat bed without using bedrails?: A Lot Help needed moving to and from a bed to a chair (including a wheelchair)?: Total Help needed standing up from a chair using your arms (e.g., wheelchair or bedside chair)?: Total Help needed to walk in hospital room?: Total Help needed climbing 3-5 steps with a railing? : Total 6 Click Score: 8    End of Session Equipment Utilized During Treatment: Gait belt Activity Tolerance: Patient tolerated treatment well Patient left: in bed (set up for hydrotherapy) Nurse Communication: Mobility status;Other (comment) (needs assist for breakfast) PT Visit Diagnosis: Muscle weakness (generalized) (M62.81);Adult, failure to thrive (R62.7);Other abnormalities of gait and mobility (R26.89);Unsteadiness on feet (R26.81);Difficulty in walking, not elsewhere classified (R26.2) Pain - Right/Left: Left Pain - part of body: Knee     Time: 9196-9183 PT Time Calculation (min) (ACUTE ONLY): 13 min  Charges:    $Therapeutic Exercise: 8-22 mins PT General Charges $$ ACUTE PT VISIT: 1 Visit                     Rakel Junio B. [name] PT, DPT Acute Rehabilitation Services Please use secure chat or  Call Office [phone]    [name] [facility] 06/28/2024, 9:14 AM

## 2024-06-29 DIAGNOSIS — L98429 Non-pressure chronic ulcer of back with unspecified severity: Secondary | ICD-10-CM | POA: Diagnosis not present

## 2024-06-29 DIAGNOSIS — I480 Paroxysmal atrial fibrillation: Secondary | ICD-10-CM | POA: Diagnosis not present

## 2024-06-29 DIAGNOSIS — A419 Sepsis, unspecified organism: Secondary | ICD-10-CM | POA: Diagnosis not present

## 2024-06-29 DIAGNOSIS — Z8673 Personal history of transient ischemic attack (TIA), and cerebral infarction without residual deficits: Secondary | ICD-10-CM | POA: Diagnosis not present

## 2024-06-29 LAB — GLUCOSE, CAPILLARY
Glucose-Capillary: 77 mg/dL (ref 70–99)
Glucose-Capillary: 130 mg/dL — ABNORMAL HIGH (ref 70–99)
Glucose-Capillary: 143 mg/dL — ABNORMAL HIGH (ref 70–99)
Glucose-Capillary: 147 mg/dL — ABNORMAL HIGH (ref 70–99)

## 2024-06-29 NOTE — Progress Notes (Signed)
 PROGRESS NOTE  Earl [name] FMW:[id number] DOB: 1941/10/10   PCP: [name], MD  Patient is from: SNF  DOA: 03/04/2024 LOS: 117  Chief complaints No chief complaint on file.    Brief Narrative / Interim history: 83 year old M with PMH of PAF on Eliquis , HFmrEF, amyloid cardiomyopathy, NIDDM-2, CVA with right-sided weakness, stage IV sacral wound, pancytopenia, MGUS, HTN, HLD and hypothyroidism brought to ED by EMS from [facility] rehab due to pain in sacral area and shortness of breath 4 days after he was discharged from the hospital.   Patient was hospitalized from 3/26-4/10 with generalized weakness, encephalopathy and COVID-19 infection.  Per daughter, patient sustained skin injury during this hospitalization, and he has had significant pain, discomfort and progressive foul-smelling discharge from sacral wound over the course of 4 to 5 days before coming back to ED.  No report of constitutional symptoms, chest pain or dyspnea.  In ED, hypotensive to 82/51.  In RVR to 140s.  Na 130.  Glucose elevated to 500.  Not in DKA or HHS.  WBC 13.6 with left shift.  CXR without acute finding.  CT abdomen and pelvis ordered.  Cultures obtained.  Admitted with working diagnosis of sepsis due to sacral pressure ulcer wound infection, A-fib with RVR, hypotension.  Cultures obtained.  Patient was started on broad-spectrum antibiotics and IV fluid.  PCCM, cardiology and general surgery consulted.  See plan and assessment for more on hospital course.  Subjective: Seen and examined earlier this morning.  No major events overnight or this morning.  Reports headache.  He says he just received Tylenol .  Feels his belly is full.  He says he just ate breakfast.  Denies chest pain or shortness of breath.  Objective: Vitals:   06/29/24 0050 06/29/24 0524 06/29/24 0820 06/29/24 1206  BP: 104/63 104/62 106/65 111/68  Pulse: 69 72 74 69  Resp:    16  Temp: 97.6 F (36.4 C) 98.9 F (37.2 C) (!) 97.4  F (36.3 C) 97.9 F (36.6 C)  TempSrc: Oral Oral Oral Oral  SpO2: 99% 93% 100% 99%  Weight:      Height:        Examination:  GENERAL: No apparent distress.  Nontoxic. HEENT: MMM.  Vision and hearing grossly intact.  NECK: Supple.  No apparent JVD.  RESP: Some work of breathing as he speaks (not new). CVS:  RRR. Heart sounds normal.  ABD/GI/GU: BS+. Abd soft, NTND.  MSK/EXT:  Moves extremities.  Prevalon boots in place.  BLE weakness. SKIN: Sacral, left hip and right heel ulcers.  See pictures under media. NEURO: AA.  Oriented appropriately.  No apparent focal neurodeficit.  BLE weakness. PSYCH: Calm. Normal affect.   Consultants PCCM General Surgery Cardiology Infectious disease Plastic surgery Orthopedic surgery Palliative medicine  Microbiology 4/14-COVID-19 PCR positive.  Flu Enza and RSV PCR nonreactive 4/14-blood cultures NGTD 6/4-sacral wound culture with Pseudomonas aeruginosa and Klebsiella pneumoniae 7/14-pleural fluid culture NGTD   Assessment and plan: Severe sepsis due to sacral pressure wound infection: Present on arrival.  Presents with generalized weakness, poor appetite, hypotension pain and drainage from sacral wound.  Had leukocytosis, tachycardia, hypotension and lactic acidosis on presentation.    -Culture data as above. -4/14-CT showed sacral wound extension to the right gluteus muscle without signs of osteomyelitis. -4/15-underwent bedside debridement of sacral decubitus wound by general surgery -4/21-ID consulted and recommended continuing broad-spectrum antibiotics that he completed on 6/13. 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 -  6/17- ID recommended continuing holding off antibiotics since he is HDS, afebrile without leukocytosis. -6/17-evaluated by plastic surgery.  Sacral wound felt to be clean.  Wound care recommended -7/13-7/13-CT showed similar appearance of  osteomyelitis underlying sacral decubitus ulcer.  -7/13-plastic surgery recommended for continued wound care and no surgical management. -7/14-Ortho evaluated patient for left hip wound.  Debulked and recommended optimizing nutrition -8/5- Surgery recommended hydrotherapy for left hip wound. Surgical debridement if not successful -8/5-wound care and instruction updated  -Optimize nutrition-see dietitian instruction below. -Overall, difficult case due to patient's nutritional status and limited mobility -Schedule Tylenol  every 6 hours while awake for pain -Oxycodone  2.5 mg every 4 hours as needed for severe pain and before dressing change    Chronic HFrEF/cardiac amyloidosis/NICM/hypotension: Hypotension resolved.  Recurrent pleural effusion and anasarca partly due to nutritional status and hypoalbuminemia.  Not on diuretics at home -Continue Tafamidis  and vutrisiran . -Continue with  low dose lasix    Recurrent bilateral pleural effusion/anasarca: Likely due to CHF, poor nutritional status/hypoalbuminemia.  Has some work of breathing as he speaks but saturating at 100% on RA.  Denies shortness of breath. -6/18-left-sided thoracentesis by IR.  Body fluid culture without any growth.   -7/14-bilateral thoracentesis by pulmonology.  Fluid transudative and culture negative -7/30-right thoracocentesis with removal of 500 mL of pleural fluid by IR.   -Remains on room air   IDDM-2 with hyperglycemia: A1c 8.4% on 4/3. Recent Labs  Lab 06/28/24 1221 06/28/24 1643 06/28/24 2139 06/29/24 0815 06/29/24 1110  GLUCAP 115* 214* 158* 77 130*  - Continue SSI-sensitive - Continue semaglutide 8 units daily.  PAF/flutter with RVR:   Now rate controlled. -Continue amiodarone  and Eliquis  Eliquis  -Cardiology signed off.   History of CVA: Stable -On Eliquis , Lipitor.    GERD epigastric pain: -Continue PPI   Anemia of chronic disease: Stable -Monitor intermittently   Hypothyroidism:  -Continue  levothyroxine   Anxiety and adjustment disorder: -Psychiatry consulted on 7/7 and recommended continuing Zoloft  and Atarax .   Constipation: - Bowel regimen.    Goals of care: CODE STATUS DNR. 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  Palliative care consulted on 4/20.  Disposition: Per family, patient lives alone before prior hospitalization.  Family not interested in SNF or LTACH.  Also do not feel he is ready to be discharged until his wounds are down to stage II.   Severe malnutrition Body mass index is 19.16 kg/m. Nutrition Problem: Severe Malnutrition Etiology: chronic illness Signs/Symptoms: energy intake < or equal to 75% for > or equal to 1 month, severe muscle depletion Interventions: Ensure Enlive (each supplement provides 350kcal and 20 grams of protein), Magic cup, Hormel Shake, MVI, Juven, Other (Comment) (vitamin C )  Multiple pressure skin injuries: Pressure Injury 02/26/24 Coccyx Mid Stage 4 - Full thickness tissue loss with exposed bone, tendon or muscle. (Active)  02/26/24 0916  Location: Coccyx  Location Orientation: Mid  Staging: Stage 4 - Full thickness tissue loss with exposed bone, tendon or muscle.  Wound Description (Comments):   DO NOT USE:  Present on Admission: Yes  Dressing Type -- (Aquacell, mepilex) 06/28/24 0930     Pressure Injury 02/26/24 Toe (Comment  which one) Anterior;Right Deep Tissue Pressure Injury - Purple or maroon localized area of discolored intact skin or blood-filled blister due to damage of underlying soft tissue from pressure and/or shear. right gre (Active)  02/26/24 1215  Location: Toe (Comment  which one)  Location Orientation: Anterior;Right  Staging: Deep Tissue Pressure Injury - Purple or maroon localized area of discolored intact skin or blood-filled blister due to damage of underlying soft tissue from pressure and/or shear.  Wound  Description (Comments): right great toe  DO NOT USE:  Present on Admission:   Dressing Type None 06/29/24 0615     Pressure Injury 03/06/24 Hip Anterior;Left;Proximal Unstageable - Full thickness tissue loss in which the base of the injury is covered by slough (yellow, tan, gray, green or brown) and/or eschar (tan, brown or black) in the wound bed. unstageable (Active)  03/06/24 1612  Location: Hip  Location Orientation: Anterior;Left;Proximal  Staging: Unstageable - Full thickness tissue loss in which the base of the injury is covered by slough (yellow, tan, gray, green or brown) and/or eschar (tan, brown or black) in the wound bed.  Wound Description (Comments): unstageable  DO NOT USE:  Present on Admission: No  Dressing Type Other (Comment);Foam - Lift dressing to assess site every shift;Gauze (Comment);Santyl  (vashe, 2  Kling) 06/28/24 0900     Pressure Injury 03/06/24 Shoulder Left Unstageable - Full thickness tissue loss in which the base of the injury is covered by slough (yellow, tan, gray, green or brown) and/or eschar (tan, brown or black) in the wound bed. unstageable (Active)  03/06/24 1613  Location: Shoulder  Location Orientation: Left  Staging: Unstageable - Full thickness tissue loss in which the base of the injury is covered by slough (yellow, tan, gray, green or brown) and/or eschar (tan, brown or black) in the wound bed.  Wound Description (Comments): unstageable  DO NOT USE:  Present on Admission: No     Pressure Injury 04/23/24 Heel Right Unstageable - Full thickness tissue loss in which the base of the injury is covered by slough (yellow, tan, gray, green or brown) and/or eschar (tan, brown or black) in the wound bed. DTPI evolving into unstageable (Active)  04/23/24   Location: Heel  Location Orientation: Right  Staging: Unstageable - Full thickness tissue loss in which the base of the injury is covered by slough (yellow, tan, gray, green or brown) and/or eschar  (tan, brown or black) in the wound bed.  Wound Description (Comments): DTPI evolving into unstageable  DO NOT USE:  Present on Admission: No     Pressure Injury 04/23/24 Heel Left Deep Tissue Pressure Injury - Purple or maroon localized area of discolored intact skin or blood-filled blister due to damage of underlying soft tissue from pressure and/or shear. (Active)  04/23/24   Location: Heel  Location Orientation: Left  Staging: Deep Tissue Pressure Injury - Purple or maroon localized area of discolored intact skin or blood-filled blister due to damage of underlying soft tissue from pressure and/or shear.  Wound Description (Comments):   DO NOT USE:  Present on Admission: No   DVT prophylaxis:  SCDs Start: 03/04/24 2151 Place TED hose Start: 03/04/24 2151 apixaban  (ELIQUIS ) tablet 5 mg  Code Status: DNR Family Communication: None at bedside. Level of care: Telemetry Medical Status is: Inpatient Remains inpatient appropriate because: Complicated disposition   Final disposition: Yet to be determined   35 minutes with more than 50% spent in reviewing records, counseling patient/family and coordinating care.   Sch Meds:  Scheduled Meds:  (feeding supplement) PROSource Plus  30 mL Oral BID BM   acetaminophen   650 mg Oral Q8H   amiodarone   200 mg Oral Daily   apixaban   5 mg Oral BID   atorvastatin   40 mg Oral Daily  calcium  carbonate  1 tablet Oral TID   collagenase    Topical Daily   diclofenac  Sodium  4 g Topical QID   feeding supplement  237 mL Oral BID BM   ferrous sulfate   325 mg Oral QHS   furosemide   20 mg Oral Daily   guaiFENesin   600 mg Oral BID   insulin  aspart  0-5 Units Subcutaneous QHS   insulin  aspart  0-9 Units Subcutaneous TID WC   insulin  glargine-yfgn  8 Units Subcutaneous Daily   levothyroxine   50 mcg Oral Q0600   lidocaine   1 patch Transdermal Q24H   liver oil-zinc  oxide   Topical BID   multivitamin with minerals  1 tablet Oral Daily   nutrition  supplement (JUVEN)  1 packet Oral BID BM   mouth rinse  15 mL Mouth Rinse 4 times per day   pantoprazole   40 mg Oral Daily   polyethylene glycol  17 g Oral BID   potassium chloride   20 mEq Oral Daily   senna-docusate  1 tablet Oral QHS   sertraline   50 mg Oral Daily   sodium chloride  flush  10-40 mL Intracatheter Q12H   Tafamidis   1 capsule Oral Daily   vitamin A   10,000 Units Oral Daily   vutrisiran  sodium  25 mg Subcutaneous Q90 days   Continuous Infusions:  dextrose      PRN Meds:.acetaminophen , bismuth  subsalicylate, butalbital -acetaminophen -caffeine , dextrose , food thickener, hydrOXYzine , lip balm, meclizine , ondansetron  **OR** ondansetron  (ZOFRAN ) IV, mouth rinse, oxyCODONE , artificial tears  Antimicrobials: Anti-infectives (From admission, onward)    Start     Dose/Rate Route Frequency Ordered Stop   04/27/24 1615  cefTAZidime  (FORTAZ ) 2 g in sodium chloride  0.9 % 100 mL IVPB        2 g 200 mL/hr over 30 Minutes Intravenous Every 8 hours 04/27/24 1518 05/03/24 2359   03/16/24 1015  cefTRIAXone  (ROCEPHIN ) 2 g in sodium chloride  0.9 % 100 mL IVPB        2 g 200 mL/hr over 30 Minutes Intravenous Every 24 hours 03/16/24 0929 03/20/24 1219   03/16/24 1015  doxycycline  (VIBRA -TABS) tablet 100 mg        100 mg Oral Every 12 hours 03/16/24 0929 03/20/24 2131   03/14/24 1000  metroNIDAZOLE  (FLAGYL ) tablet 500 mg        500 mg Oral Every 12 hours 03/14/24 0803 03/19/24 2219   03/13/24 1000  cefadroxil  (DURICEF) capsule 1,000 mg  Status:  Discontinued        1,000 mg Oral 2 times daily 03/12/24 1632 03/16/24 0929   03/07/24 2200  metroNIDAZOLE  (FLAGYL ) tablet 500 mg        500 mg Oral Every 12 hours 03/07/24 0840 03/11/24 2258   03/05/24 1930  vancomycin  (VANCOREADY) IVPB 1250 mg/250 mL  Status:  Discontinued        1,250 mg 166.7 mL/hr over 90 Minutes Intravenous Every 24 hours 03/04/24 2205 03/04/24 2224   03/05/24 1930  Vancomycin  (VANCOCIN ) 1,250 mg in sodium chloride  0.9 % 250  mL IVPB  Status:  Discontinued        1,250 mg 166.7 mL/hr over 90 Minutes Intravenous Every 24 hours 03/04/24 2224 03/05/24 0717   03/05/24 1930  vancomycin  (VANCOREADY) IVPB 1500 mg/300 mL  Status:  Discontinued        1,500 mg 150 mL/hr over 120 Minutes Intravenous Every 24 hours 03/05/24 0717 03/12/24 1636   03/05/24 1400  ceFEPIme  (MAXIPIME ) 2 g in sodium chloride  0.9 % 100 mL  IVPB  Status:  Discontinued        2 g 200 mL/hr over 30 Minutes Intravenous Every 8 hours 03/05/24 0717 03/12/24 1632   03/05/24 0600  ceFEPIme  (MAXIPIME ) 2 g in sodium chloride  0.9 % 100 mL IVPB  Status:  Discontinued        2 g 200 mL/hr over 30 Minutes Intravenous Every 12 hours 03/04/24 2205 03/05/24 0717   03/05/24 0000  metroNIDAZOLE  (FLAGYL ) IVPB 500 mg  Status:  Discontinued        500 mg 100 mL/hr over 60 Minutes Intravenous Every 12 hours 03/04/24 2206 03/04/24 2209   03/04/24 1730  aztreonam (AZACTAM) 2 g in sodium chloride  0.9 % 100 mL IVPB  Status:  Discontinued        2 g 200 mL/hr over 30 Minutes Intravenous  Once 03/04/24 1715 03/04/24 1724   03/04/24 1730  metroNIDAZOLE  (FLAGYL ) IVPB 500 mg  Status:  Discontinued        500 mg 100 mL/hr over 60 Minutes Intravenous Every 12 hours 03/04/24 1715 03/07/24 0840   03/04/24 1730  vancomycin  (VANCOREADY) IVPB 1500 mg/300 mL        1,500 mg 150 mL/hr over 120 Minutes Intravenous  Once 03/04/24 1715 03/04/24 2153   03/04/24 1730  ceFEPIme  (MAXIPIME ) 2 g in sodium chloride  0.9 % 100 mL IVPB        2 g 200 mL/hr over 30 Minutes Intravenous  Once 03/04/24 1724 03/04/24 1826        I have personally reviewed the following labs and images: CBC: No results for input(s): WBC, NEUTROABS, HGB, HCT, MCV, PLT in the last 168 hours.  BMP &GFR No results for input(s): NA, K, CL, CO2, GLUCOSE, BUN, CREATININE, CALCIUM , MG, PHOS in the last 168 hours.  Invalid input(s): GFRCG  Estimated Creatinine Clearance: 49.3 mL/min  (by C-G formula based on SCr of 1 mg/dL). Liver & Pancreas: No results for input(s): AST, ALT, ALKPHOS, BILITOT, PROT, ALBUMIN  in the last 168 hours. No results for input(s): LIPASE, AMYLASE in the last 168 hours. No results for input(s): AMMONIA in the last 168 hours. Diabetic: No results for input(s): HGBA1C in the last 72 hours. Recent Labs  Lab 06/28/24 1221 06/28/24 1643 06/28/24 2139 06/29/24 0815 06/29/24 1110  GLUCAP 115* 214* 158* 77 130*   Cardiac Enzymes: No results for input(s): CKTOTAL, CKMB, CKMBINDEX, TROPONINI in the last 168 hours. No results for input(s): PROBNP in the last 8760 hours. Coagulation Profile: No results for input(s): INR, PROTIME in the last 168 hours. Thyroid  Function Tests: No results for input(s): TSH, T4TOTAL, FREET4, T3FREE, THYROIDAB in the last 72 hours. Lipid Profile: No results for input(s): CHOL, HDL, LDLCALC, TRIG, CHOLHDL, LDLDIRECT in the last 72 hours. Anemia Panel: No results for input(s): VITAMINB12, FOLATE, FERRITIN, TIBC, IRON, RETICCTPCT in the last 72 hours. Urine analysis:    Component Value Date/Time   COLORURINE STRAW (A) 03/04/2024 2139   APPEARANCEUR CLEAR 03/04/2024 2139   LABSPEC 1.016 03/04/2024 2139   PHURINE 6.0 03/04/2024 2139   GLUCOSEU >=500 (A) 03/04/2024 2139   HGBUR SMALL (A) 03/04/2024 2139   BILIRUBINUR NEGATIVE 03/04/2024 2139   KETONESUR NEGATIVE 03/04/2024 2139   PROTEINUR 30 (A) 03/04/2024 2139   UROBILINOGEN 0.2 05/05/2015 1443   NITRITE NEGATIVE 03/04/2024 2139   LEUKOCYTESUR NEGATIVE 03/04/2024 2139   Sepsis Labs: Invalid input(s): PROCALCITONIN, LACTICIDVEN  Microbiology: No results found for this or any previous visit (from the past 240 hours).  Radiology  Studies: No results found.    Earl [name] T. Quinnlan Abruzzo [name]  If 7PM-7AM, please contact night-coverage [email or web address] 06/29/2024, 2:52 PM

## 2024-06-29 NOTE — Plan of Care (Signed)
  Problem: Fluid Volume: Goal: Hemodynamic stability will improve Outcome: Progressing   Problem: Clinical Measurements: Goal: Diagnostic test results will improve Outcome: Progressing Goal: Signs and symptoms of infection will decrease Outcome: Progressing   Problem: Education: Goal: Knowledge of risk factors and measures for prevention of condition will improve Outcome: Progressing   Problem: Coping: Goal: Psychosocial and spiritual needs will be supported Outcome: Progressing   Problem: Respiratory: Goal: Will maintain a patent airway Outcome: Progressing Goal: Complications related to the disease process, condition or treatment will be avoided or minimized Outcome: Progressing   Problem: Education: Goal: Ability to describe self-care measures that may prevent or decrease complications (Diabetes Survival Skills Education) will improve Outcome: Progressing   Problem: Coping: Goal: Ability to adjust to condition or change in health will improve Outcome: Progressing   Problem: Fluid Volume: Goal: Ability to maintain a balanced intake and output will improve Outcome: Progressing   Problem: Health Behavior/Discharge Planning: Goal: Ability to manage health-related needs will improve Outcome: Progressing   Problem: Metabolic: Goal: Ability to maintain appropriate glucose levels will improve Outcome: Progressing   Problem: Nutritional: Goal: Maintenance of adequate nutrition will improve Outcome: Progressing Goal: Progress toward achieving an optimal weight will improve Outcome: Progressing   Problem: Skin Integrity: Goal: Risk for impaired skin integrity will decrease Outcome: Progressing   Problem: Tissue Perfusion: Goal: Adequacy of tissue perfusion will improve Outcome: Progressing   Problem: Education: Goal: Knowledge of General Education information will improve Description: Including pain rating scale, medication(s)/side effects and non-pharmacologic  comfort measures Outcome: Progressing   Problem: Health Behavior/Discharge Planning: Goal: Ability to manage health-related needs will improve Outcome: Progressing   Problem: Clinical Measurements: Goal: Ability to maintain clinical measurements within normal limits will improve Outcome: Progressing Goal: Will remain free from infection Outcome: Progressing Goal: Diagnostic test results will improve Outcome: Progressing Goal: Respiratory complications will improve Outcome: Progressing Goal: Cardiovascular complication will be avoided Outcome: Progressing   Problem: Activity: Goal: Risk for activity intolerance will decrease Outcome: Progressing   Problem: Nutrition: Goal: Adequate nutrition will be maintained Outcome: Progressing   Problem: Coping: Goal: Level of anxiety will decrease Outcome: Progressing   Problem: Elimination: Goal: Will not experience complications related to bowel motility Outcome: Progressing Goal: Will not experience complications related to urinary retention Outcome: Progressing   Problem: Pain Managment: Goal: General experience of comfort will improve and/or be controlled Outcome: Progressing   Problem: Safety: Goal: Ability to remain free from injury will improve Outcome: Progressing   Problem: Skin Integrity: Goal: Risk for impaired skin integrity will decrease Outcome: Progressing

## 2024-06-30 DIAGNOSIS — Z8673 Personal history of transient ischemic attack (TIA), and cerebral infarction without residual deficits: Secondary | ICD-10-CM | POA: Diagnosis not present

## 2024-06-30 DIAGNOSIS — I480 Paroxysmal atrial fibrillation: Secondary | ICD-10-CM | POA: Diagnosis not present

## 2024-06-30 DIAGNOSIS — A419 Sepsis, unspecified organism: Secondary | ICD-10-CM | POA: Diagnosis not present

## 2024-06-30 DIAGNOSIS — L98429 Non-pressure chronic ulcer of back with unspecified severity: Secondary | ICD-10-CM | POA: Diagnosis not present

## 2024-06-30 LAB — GLUCOSE, CAPILLARY
Glucose-Capillary: 101 mg/dL — ABNORMAL HIGH (ref 70–99)
Glucose-Capillary: 129 mg/dL — ABNORMAL HIGH (ref 70–99)
Glucose-Capillary: 134 mg/dL — ABNORMAL HIGH (ref 70–99)
Glucose-Capillary: 146 mg/dL — ABNORMAL HIGH (ref 70–99)
Glucose-Capillary: 65 mg/dL — ABNORMAL LOW (ref 70–99)
Glucose-Capillary: 75 mg/dL (ref 70–99)
Glucose-Capillary: 87 mg/dL (ref 70–99)

## 2024-06-30 MED ORDER — ALBUMIN HUMAN 25 % IV SOLN
25.0000 g | Freq: Once | INTRAVENOUS | Status: AC
  Administered 2024-06-30: 12.5 g via INTRAVENOUS
  Filled 2024-06-30: qty 100

## 2024-06-30 MED ORDER — FUROSEMIDE 10 MG/ML IJ SOLN
40.0000 mg | Freq: Once | INTRAMUSCULAR | Status: AC
  Administered 2024-06-30: 40 mg via INTRAVENOUS
  Filled 2024-06-30: qty 4

## 2024-06-30 NOTE — Plan of Care (Signed)
  Problem: Fluid Volume: Goal: Hemodynamic stability will improve Outcome: Progressing   Problem: Clinical Measurements: Goal: Diagnostic test results will improve Outcome: Progressing Goal: Signs and symptoms of infection will decrease Outcome: Progressing   Problem: Education: Goal: Knowledge of risk factors and measures for prevention of condition will improve Outcome: Progressing   Problem: Coping: Goal: Psychosocial and spiritual needs will be supported Outcome: Progressing   Problem: Respiratory: Goal: Will maintain a patent airway Outcome: Progressing Goal: Complications related to the disease process, condition or treatment will be avoided or minimized Outcome: Progressing   Problem: Education: Goal: Ability to describe self-care measures that may prevent or decrease complications (Diabetes Survival Skills Education) will improve Outcome: Progressing   Problem: Coping: Goal: Ability to adjust to condition or change in health will improve Outcome: Progressing   Problem: Fluid Volume: Goal: Ability to maintain a balanced intake and output will improve Outcome: Progressing   Problem: Health Behavior/Discharge Planning: Goal: Ability to manage health-related needs will improve Outcome: Progressing   Problem: Metabolic: Goal: Ability to maintain appropriate glucose levels will improve Outcome: Progressing   Problem: Nutritional: Goal: Maintenance of adequate nutrition will improve Outcome: Progressing Goal: Progress toward achieving an optimal weight will improve Outcome: Progressing   Problem: Skin Integrity: Goal: Risk for impaired skin integrity will decrease Outcome: Progressing   Problem: Tissue Perfusion: Goal: Adequacy of tissue perfusion will improve Outcome: Progressing   Problem: Education: Goal: Knowledge of General Education information will improve Description: Including pain rating scale, medication(s)/side effects and non-pharmacologic  comfort measures Outcome: Progressing   Problem: Health Behavior/Discharge Planning: Goal: Ability to manage health-related needs will improve Outcome: Progressing   Problem: Clinical Measurements: Goal: Ability to maintain clinical measurements within normal limits will improve Outcome: Progressing Goal: Will remain free from infection Outcome: Progressing Goal: Diagnostic test results will improve Outcome: Progressing Goal: Respiratory complications will improve Outcome: Progressing Goal: Cardiovascular complication will be avoided Outcome: Progressing   Problem: Activity: Goal: Risk for activity intolerance will decrease Outcome: Progressing   Problem: Nutrition: Goal: Adequate nutrition will be maintained Outcome: Progressing   Problem: Coping: Goal: Level of anxiety will decrease Outcome: Progressing   Problem: Elimination: Goal: Will not experience complications related to bowel motility Outcome: Progressing Goal: Will not experience complications related to urinary retention Outcome: Progressing   Problem: Pain Managment: Goal: General experience of comfort will improve and/or be controlled Outcome: Progressing   Problem: Safety: Goal: Ability to remain free from injury will improve Outcome: Progressing   Problem: Skin Integrity: Goal: Risk for impaired skin integrity will decrease Outcome: Progressing

## 2024-06-30 NOTE — Progress Notes (Signed)
 PROGRESS NOTE  Earl [name] FMW:[id number] DOB: [birth year]   PCP: [name], MD  Patient is from: SNF  DOA: 03/04/2024 LOS: 118  Chief complaints No chief complaint on file.    Brief Narrative / Interim history: 83 year old M with PMH of PAF on Eliquis , HFmrEF, amyloid cardiomyopathy, NIDDM-2, CVA with right-sided weakness, stage IV sacral wound, pancytopenia, MGUS, HTN, HLD and hypothyroidism brought to ED by EMS from [facility] rehab due to pain in sacral area and shortness of breath 4 days after he was discharged from the hospital.   Patient was hospitalized from 3/26-4/10 with generalized weakness, encephalopathy and COVID-19 infection.  Per daughter, patient sustained skin injury during this hospitalization, and he has had significant pain, discomfort and progressive foul-smelling discharge from sacral wound over the course of 4 to 5 days before coming back to ED.  No report of constitutional symptoms, chest pain or dyspnea.  In ED, hypotensive to 82/51.  In RVR to 140s.  Na 130.  Glucose elevated to 500.  Not in DKA or HHS.  WBC 13.6 with left shift.  CXR without acute finding.  CT abdomen and pelvis ordered.  Cultures obtained.  Admitted with working diagnosis of sepsis due to sacral pressure ulcer wound infection, A-fib with RVR, hypotension.  Cultures obtained.  Patient was started on broad-spectrum antibiotics and IV fluid.  PCCM, cardiology and general surgery consulted.  See plan and assessment for more on hospital course.  Subjective: Seen and examined earlier this morning.  No major events overnight or this morning.  No complaints today.  Family had some concern about BLE swelling last night.  Ace wrap applied.  No edema or swelling proximally.  Objective: Vitals:   06/29/24 2144 06/30/24 0022 06/30/24 0426 06/30/24 0820  BP: 116/68 117/78 121/87 117/68  Pulse: 70 75 76 78  Resp:  16 15 18   Temp: 98.2 F (36.8 C) 97.7 F (36.5 C) (!) 97.4 F (36.3 C) (!)  97.4 F (36.3 C)  TempSrc: Oral     SpO2:  99% 97% 98%  Weight:      Height:        Examination:  GENERAL: No apparent distress.  Nontoxic. HEENT: MMM.  Vision and hearing grossly intact.  NECK: Supple.  No apparent JVD.  RESP: Some work of breathing as he speaks (not new). CVS:  RRR. Heart sounds normal.  ABD/GI/GU: BS+. Abd soft, NTND.  MSK/EXT:  Moves extremities.  Ace wrap over BLE.  No edema swelling proximally SKIN: Sacral, left hip and right heel ulcers.  See pictures under media. NEURO: AA.  Oriented appropriately.  No apparent focal neurodeficit.  BLE weakness. PSYCH: Calm. Normal affect.   Consultants PCCM General Surgery Cardiology Infectious disease Plastic surgery Orthopedic surgery Palliative medicine  Microbiology 4/14-COVID-19 PCR positive.  Flu Enza and RSV PCR nonreactive 4/14-blood cultures NGTD 6/4-sacral wound culture with Pseudomonas aeruginosa and Klebsiella pneumoniae 7/14-pleural fluid culture NGTD   Assessment and plan: Severe sepsis due to sacral pressure wound infection: Present on arrival.  Presents with generalized weakness, poor appetite, hypotension pain and drainage from sacral wound.  Had leukocytosis, tachycardia, hypotension and lactic acidosis on presentation.    -Culture data as above. -4/14-CT showed sacral wound extension to the right gluteus muscle without signs of osteomyelitis. -4/15-underwent bedside debridement of sacral decubitus wound by general surgery -4/21-ID consulted and recommended continuing broad-spectrum antibiotics that he completed on 6/13. 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 -6/17- ID recommended continuing  holding off antibiotics since he is HDS, afebrile without leukocytosis. -6/17-evaluated by plastic surgery.  Sacral wound felt to be clean.  Wound care recommended -7/13-7/13-CT showed similar appearance of osteomyelitis underlying sacral  decubitus ulcer.  -7/13-plastic surgery recommended for continued wound care and no surgical management. -7/14-Ortho evaluated patient for left hip wound.  Debulked and recommended optimizing nutrition -8/5- Surgery recommended hydrotherapy for left hip wound. Surgical debridement if not successful -8/5-wound care and instruction updated  -Optimize nutrition-see dietitian instruction below. -Overall, difficult case due to patient's nutritional status and limited mobility -Schedule Tylenol  every 6 hours while awake for pain -Oxycodone  2.5 mg every 4 hours as needed for severe pain and before dressing change    Chronic HFrEF/cardiac amyloidosis/NICM/hypotension: Hypotension resolved.  Recurrent pleural effusion and anasarca partly due to nutritional status and hypoalbuminemia.  Not on diuretics at home -Continue Tafamidis  and vutrisiran . -Continue with low dose lasix  -Ace wrap for BLE edema   Recurrent bilateral pleural effusion/anasarca: Likely due to CHF, poor nutritional status/hypoalbuminemia.  Has some work of breathing as he speaks but saturating at 100% on RA.  Denies chest pain or SOB. -6/18-left-sided thoracentesis by IR.  Body fluid culture without any growth.   -7/14-bilateral thoracentesis by pulmonology.  Fluid transudative and culture negative -7/30-right thoracocentesis with removal of 500 mL of pleural fluid by IR.   -Remains on room air   IDDM-2 with hyperglycemia: A1c 8.4% on 4/3. Recent Labs  Lab 06/29/24 1654 06/29/24 2155 06/30/24 0025 06/30/24 0422 06/30/24 0816  GLUCAP 143* 147* 134* 101* 87  -Continue SSI-sensitive -Continue semaglutide 8 units daily.  PAF/flutter with RVR: Rate controlled. -Continue amiodarone  and Eliquis  Eliquis  -Cardiology signed off.   History of CVA: Stable -On Eliquis , Lipitor.    GERD epigastric pain: -Continue PPI   Anemia of chronic disease: Stable -Monitor intermittently   Hypothyroidism:  -Continue  levothyroxine   Anxiety and adjustment disorder: -Psychiatry consulted on 7/7 and recommended continuing Zoloft  and Atarax .   Constipation: - Bowel regimen.    Goals of care: CODE STATUS DNR. 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  Palliative care consulted on 4/20.  Disposition: Per family, patient lives alone before prior hospitalization.  Family not interested in SNF or LTACH.  Also do not feel he is ready to be discharged until his wounds are down to stage II.   Severe malnutrition Body mass index is 19.16 kg/m. Nutrition Problem: Severe Malnutrition Etiology: chronic illness Signs/Symptoms: energy intake < or equal to 75% for > or equal to 1 month, severe muscle depletion Interventions: Ensure Enlive (each supplement provides 350kcal and 20 grams of protein), Magic cup, Hormel Shake, MVI, Juven, Other (Comment) (vitamin C )  Multiple pressure skin injuries: Pressure Injury 02/26/24 Coccyx Mid Stage 4 - Full thickness tissue loss with exposed bone, tendon or muscle. (Active)  02/26/24 0916  Location: Coccyx  Location Orientation: Mid  Staging: Stage 4 - Full thickness tissue loss with exposed bone, tendon or muscle.  Wound Description (Comments):   DO NOT USE:  Present on Admission: Yes  Dressing Type -- (Aquacell, mepilex) 06/28/24 0930     Pressure Injury 02/26/24 Toe (Comment  which one) Anterior;Right Deep Tissue Pressure Injury - Purple or maroon localized area of discolored intact skin or blood-filled blister due to damage of underlying soft tissue from pressure and/or shear. right gre (Active)  02/26/24 1215  Location: Toe (Comment  which one)  Location Orientation: Anterior;Right  Staging: Deep Tissue  Pressure Injury - Purple or maroon localized area of discolored intact skin or blood-filled blister due to damage of underlying soft tissue from pressure and/or shear.  Wound  Description (Comments): right great toe  DO NOT USE:  Present on Admission:   Dressing Type None 06/29/24 0615     Pressure Injury 03/06/24 Hip Anterior;Left;Proximal Unstageable - Full thickness tissue loss in which the base of the injury is covered by slough (yellow, tan, gray, green or brown) and/or eschar (tan, brown or black) in the wound bed. unstageable (Active)  03/06/24 1612  Location: Hip  Location Orientation: Anterior;Left;Proximal  Staging: Unstageable - Full thickness tissue loss in which the base of the injury is covered by slough (yellow, tan, gray, green or brown) and/or eschar (tan, brown or black) in the wound bed.  Wound Description (Comments): unstageable  DO NOT USE:  Present on Admission: No  Dressing Type Other (Comment);Foam - Lift dressing to assess site every shift;Gauze (Comment);Santyl  (vashe, 2  Kling) 06/28/24 0900     Pressure Injury 03/06/24 Shoulder Left Unstageable - Full thickness tissue loss in which the base of the injury is covered by slough (yellow, tan, gray, green or brown) and/or eschar (tan, brown or black) in the wound bed. unstageable (Active)  03/06/24 1613  Location: Shoulder  Location Orientation: Left  Staging: Unstageable - Full thickness tissue loss in which the base of the injury is covered by slough (yellow, tan, gray, green or brown) and/or eschar (tan, brown or black) in the wound bed.  Wound Description (Comments): unstageable  DO NOT USE:  Present on Admission: No     Pressure Injury 04/23/24 Heel Right Unstageable - Full thickness tissue loss in which the base of the injury is covered by slough (yellow, tan, gray, green or brown) and/or eschar (tan, brown or black) in the wound bed. DTPI evolving into unstageable (Active)  04/23/24   Location: Heel  Location Orientation: Right  Staging: Unstageable - Full thickness tissue loss in which the base of the injury is covered by slough (yellow, tan, gray, green or brown) and/or eschar  (tan, brown or black) in the wound bed.  Wound Description (Comments): DTPI evolving into unstageable  DO NOT USE:  Present on Admission: No     Pressure Injury 04/23/24 Heel Left Deep Tissue Pressure Injury - Purple or maroon localized area of discolored intact skin or blood-filled blister due to damage of underlying soft tissue from pressure and/or shear. (Active)  04/23/24   Location: Heel  Location Orientation: Left  Staging: Deep Tissue Pressure Injury - Purple or maroon localized area of discolored intact skin or blood-filled blister due to damage of underlying soft tissue from pressure and/or shear.  Wound Description (Comments):   DO NOT USE:  Present on Admission: No   DVT prophylaxis:  SCDs Start: 03/04/24 2151 Place TED hose Start: 03/04/24 2151 apixaban  (ELIQUIS ) tablet 5 mg  Code Status: DNR Family Communication: None at bedside. Level of care: Telemetry Medical Status is: Inpatient Remains inpatient appropriate because: Complicated disposition   Final disposition: Yet to be determined   35 minutes with more than 50% spent in reviewing records, counseling patient/family and coordinating care.   Sch Meds:  Scheduled Meds:  (feeding supplement) PROSource Plus  30 mL Oral BID BM   acetaminophen   650 mg Oral Q8H   amiodarone   200 mg Oral Daily   apixaban   5 mg Oral BID   atorvastatin   40 mg Oral Daily   calcium  carbonate  1 tablet Oral TID   collagenase    Topical Daily   diclofenac  Sodium  4 g Topical QID   feeding supplement  237 mL Oral BID BM   ferrous sulfate   325 mg Oral QHS   furosemide   20 mg Oral Daily   guaiFENesin   600 mg Oral BID   insulin  aspart  0-5 Units Subcutaneous QHS   insulin  aspart  0-9 Units Subcutaneous TID WC   insulin  glargine-yfgn  8 Units Subcutaneous Daily   levothyroxine   50 mcg Oral Q0600   lidocaine   1 patch Transdermal Q24H   liver oil-zinc  oxide   Topical BID   multivitamin with minerals  1 tablet Oral Daily   nutrition  supplement (JUVEN)  1 packet Oral BID BM   mouth rinse  15 mL Mouth Rinse 4 times per day   pantoprazole   40 mg Oral Daily   polyethylene glycol  17 g Oral BID   potassium chloride   20 mEq Oral Daily   senna-docusate  1 tablet Oral QHS   sertraline   50 mg Oral Daily   sodium chloride  flush  10-40 mL Intracatheter Q12H   Tafamidis   1 capsule Oral Daily   vitamin A   10,000 Units Oral Daily   vutrisiran  sodium  25 mg Subcutaneous Q90 days   Continuous Infusions:  dextrose      PRN Meds:.acetaminophen , bismuth  subsalicylate, butalbital -acetaminophen -caffeine , dextrose , food thickener, hydrOXYzine , lip balm, meclizine , ondansetron  **OR** ondansetron  (ZOFRAN ) IV, mouth rinse, oxyCODONE , artificial tears  Antimicrobials: Anti-infectives (From admission, onward)    Start     Dose/Rate Route Frequency Ordered Stop   04/27/24 1615  cefTAZidime  (FORTAZ ) 2 g in sodium chloride  0.9 % 100 mL IVPB        2 g 200 mL/hr over 30 Minutes Intravenous Every 8 hours 04/27/24 1518 05/03/24 2359   03/16/24 1015  cefTRIAXone  (ROCEPHIN ) 2 g in sodium chloride  0.9 % 100 mL IVPB        2 g 200 mL/hr over 30 Minutes Intravenous Every 24 hours 03/16/24 0929 03/20/24 1219   03/16/24 1015  doxycycline  (VIBRA -TABS) tablet 100 mg        100 mg Oral Every 12 hours 03/16/24 0929 03/20/24 2131   03/14/24 1000  metroNIDAZOLE  (FLAGYL ) tablet 500 mg        500 mg Oral Every 12 hours 03/14/24 0803 03/19/24 2219   03/13/24 1000  cefadroxil  (DURICEF) capsule 1,000 mg  Status:  Discontinued        1,000 mg Oral 2 times daily 03/12/24 1632 03/16/24 0929   03/07/24 2200  metroNIDAZOLE  (FLAGYL ) tablet 500 mg        500 mg Oral Every 12 hours 03/07/24 0840 03/11/24 2258   03/05/24 1930  vancomycin  (VANCOREADY) IVPB 1250 mg/250 mL  Status:  Discontinued        1,250 mg 166.7 mL/hr over 90 Minutes Intravenous Every 24 hours 03/04/24 2205 03/04/24 2224   03/05/24 1930  Vancomycin  (VANCOCIN ) 1,250 mg in sodium chloride  0.9 % 250  mL IVPB  Status:  Discontinued        1,250 mg 166.7 mL/hr over 90 Minutes Intravenous Every 24 hours 03/04/24 2224 03/05/24 0717   03/05/24 1930  vancomycin  (VANCOREADY) IVPB 1500 mg/300 mL  Status:  Discontinued        1,500 mg 150 mL/hr over 120 Minutes Intravenous Every 24 hours 03/05/24 0717 03/12/24 1636   03/05/24 1400  ceFEPIme  (MAXIPIME ) 2 g in sodium chloride  0.9 % 100 mL IVPB  Status:  Discontinued        2 g 200 mL/hr over 30 Minutes Intravenous Every 8 hours 03/05/24 0717 03/12/24 1632   03/05/24 0600  ceFEPIme  (MAXIPIME ) 2 g in sodium chloride  0.9 % 100 mL IVPB  Status:  Discontinued        2 g 200 mL/hr over 30 Minutes Intravenous Every 12 hours 03/04/24 2205 03/05/24 0717   03/05/24 0000  metroNIDAZOLE  (FLAGYL ) IVPB 500 mg  Status:  Discontinued        500 mg 100 mL/hr over 60 Minutes Intravenous Every 12 hours 03/04/24 2206 03/04/24 2209   03/04/24 1730  aztreonam (AZACTAM) 2 g in sodium chloride  0.9 % 100 mL IVPB  Status:  Discontinued        2 g 200 mL/hr over 30 Minutes Intravenous  Once 03/04/24 1715 03/04/24 1724   03/04/24 1730  metroNIDAZOLE  (FLAGYL ) IVPB 500 mg  Status:  Discontinued        500 mg 100 mL/hr over 60 Minutes Intravenous Every 12 hours 03/04/24 1715 03/07/24 0840   03/04/24 1730  vancomycin  (VANCOREADY) IVPB 1500 mg/300 mL        1,500 mg 150 mL/hr over 120 Minutes Intravenous  Once 03/04/24 1715 03/04/24 2153   03/04/24 1730  ceFEPIme  (MAXIPIME ) 2 g in sodium chloride  0.9 % 100 mL IVPB        2 g 200 mL/hr over 30 Minutes Intravenous  Once 03/04/24 1724 03/04/24 1826        I have personally reviewed the following labs and images: CBC: No results for input(s): WBC, NEUTROABS, HGB, HCT, MCV, PLT in the last 168 hours.  BMP &GFR No results for input(s): NA, K, CL, CO2, GLUCOSE, BUN, CREATININE, CALCIUM , MG, PHOS in the last 168 hours.  Invalid input(s): GFRCG  Estimated Creatinine Clearance: 49.3 mL/min  (by C-G formula based on SCr of 1 mg/dL). Liver & Pancreas: No results for input(s): AST, ALT, ALKPHOS, BILITOT, PROT, ALBUMIN  in the last 168 hours. No results for input(s): LIPASE, AMYLASE in the last 168 hours. No results for input(s): AMMONIA in the last 168 hours. Diabetic: No results for input(s): HGBA1C in the last 72 hours. Recent Labs  Lab 06/29/24 1654 06/29/24 2155 06/30/24 0025 06/30/24 0422 06/30/24 0816  GLUCAP 143* 147* 134* 101* 87   Cardiac Enzymes: No results for input(s): CKTOTAL, CKMB, CKMBINDEX, TROPONINI in the last 168 hours. No results for input(s): PROBNP in the last 8760 hours. Coagulation Profile: No results for input(s): INR, PROTIME in the last 168 hours. Thyroid  Function Tests: No results for input(s): TSH, T4TOTAL, FREET4, T3FREE, THYROIDAB in the last 72 hours. Lipid Profile: No results for input(s): CHOL, HDL, LDLCALC, TRIG, CHOLHDL, LDLDIRECT in the last 72 hours. Anemia Panel: No results for input(s): VITAMINB12, FOLATE, FERRITIN, TIBC, IRON, RETICCTPCT in the last 72 hours. Urine analysis:    Component Value Date/Time   COLORURINE STRAW (A) 03/04/2024 2139   APPEARANCEUR CLEAR 03/04/2024 2139   LABSPEC 1.016 03/04/2024 2139   PHURINE 6.0 03/04/2024 2139   GLUCOSEU >=500 (A) 03/04/2024 2139   HGBUR SMALL (A) 03/04/2024 2139   BILIRUBINUR NEGATIVE 03/04/2024 2139   KETONESUR NEGATIVE 03/04/2024 2139   PROTEINUR 30 (A) 03/04/2024 2139   UROBILINOGEN 0.2 05/05/2015 1443   NITRITE NEGATIVE 03/04/2024 2139   LEUKOCYTESUR NEGATIVE 03/04/2024 2139   Sepsis Labs: Invalid input(s): PROCALCITONIN, LACTICIDVEN  Microbiology: No results found for this or any previous visit (from the past 240 hours).  Radiology Studies: No results found.  Kim Oki T. Darey Hershberger [name]  If 7PM-7AM, please contact night-coverage [email or web address] 06/30/2024, 11:15 AM

## 2024-07-01 ENCOUNTER — Inpatient Hospital Stay (HOSPITAL_COMMUNITY)

## 2024-07-01 DIAGNOSIS — I483 Typical atrial flutter: Secondary | ICD-10-CM | POA: Diagnosis not present

## 2024-07-01 DIAGNOSIS — Z8679 Personal history of other diseases of the circulatory system: Secondary | ICD-10-CM | POA: Diagnosis not present

## 2024-07-01 DIAGNOSIS — L89154 Pressure ulcer of sacral region, stage 4: Secondary | ICD-10-CM | POA: Diagnosis not present

## 2024-07-01 DIAGNOSIS — A419 Sepsis, unspecified organism: Secondary | ICD-10-CM | POA: Diagnosis not present

## 2024-07-01 LAB — PREALBUMIN: Prealbumin: <5 mg/dL — ABNORMAL LOW (ref 18–38)

## 2024-07-01 LAB — CBC
HCT: 33.6 % — ABNORMAL LOW (ref 39.0–52.0)
Hemoglobin: 11.4 g/dL — ABNORMAL LOW (ref 13.0–17.0)
MCH: 29.1 pg (ref 26.0–34.0)
MCHC: 33.9 g/dL (ref 30.0–36.0)
MCV: 85.7 fL (ref 80.0–100.0)
Platelets: 224 K/uL (ref 150–400)
RBC: 3.92 MIL/uL — ABNORMAL LOW (ref 4.22–5.81)
RDW: 19.1 % — ABNORMAL HIGH (ref 11.5–15.5)
WBC: 10 K/uL (ref 4.0–10.5)
nRBC: 0 % (ref 0.0–0.2)

## 2024-07-01 LAB — COMPREHENSIVE METABOLIC PANEL WITH GFR
ALT: 78 U/L — ABNORMAL HIGH (ref 0–44)
AST: 67 U/L — ABNORMAL HIGH (ref 15–41)
Albumin: 2.8 g/dL — ABNORMAL LOW (ref 3.5–5.0)
Alkaline Phosphatase: 184 U/L — ABNORMAL HIGH (ref 38–126)
Anion gap: 9 (ref 5–15)
BUN: 47 mg/dL — ABNORMAL HIGH (ref 8–23)
CO2: 24 mmol/L (ref 22–32)
Calcium: 9 mg/dL (ref 8.9–10.3)
Chloride: 104 mmol/L (ref 98–111)
Creatinine, Ser: 1.22 mg/dL (ref 0.61–1.24)
GFR, Estimated: 59 mL/min — ABNORMAL LOW (ref >60)
Glucose, Bld: 88 mg/dL (ref 70–99)
Potassium: 4.9 mmol/L (ref 3.5–5.1)
Sodium: 137 mmol/L (ref 135–145)
Total Bilirubin: 1.5 mg/dL — ABNORMAL HIGH (ref 0.0–1.2)
Total Protein: 5.9 g/dL — ABNORMAL LOW (ref 6.5–8.1)

## 2024-07-01 LAB — GLUCOSE, CAPILLARY
Glucose-Capillary: 130 mg/dL — ABNORMAL HIGH (ref 70–99)
Glucose-Capillary: 84 mg/dL (ref 70–99)
Glucose-Capillary: 151 mg/dL — ABNORMAL HIGH (ref 70–99)

## 2024-07-01 LAB — BRAIN NATRIURETIC PEPTIDE: B Natriuretic Peptide: 1903.2 pg/mL — ABNORMAL HIGH (ref 0.0–100.0)

## 2024-07-01 LAB — MAGNESIUM: Magnesium: 1.9 mg/dL (ref 1.7–2.4)

## 2024-07-01 LAB — PHOSPHORUS: Phosphorus: 3.1 mg/dL (ref 2.5–4.6)

## 2024-07-01 MED ORDER — FUROSEMIDE 40 MG PO TABS
40.0000 mg | ORAL_TABLET | Freq: Every day | ORAL | Status: DC
  Administered 2024-07-01 (×2): 40 mg via ORAL
  Filled 2024-07-01: qty 1

## 2024-07-01 NOTE — Progress Notes (Signed)
 PROGRESS NOTE  Earl [name] FMW:[id number] DOB: 1941-07-01   PCP: [name], MD  Patient is from: SNF  DOA: 03/04/2024 LOS: 119  Chief complaints No chief complaint on file.    Brief Narrative / Interim history: 83 year old M with PMH of PAF on Eliquis , HFmrEF, amyloid cardiomyopathy, NIDDM-2, CVA with right-sided weakness, stage IV sacral wound, pancytopenia, MGUS, HTN, HLD and hypothyroidism brought to ED by EMS from [facility] rehab due to pain in sacral area and shortness of breath 4 days after he was discharged from the hospital.   Patient was hospitalized from 3/26-4/10 with generalized weakness, encephalopathy and COVID-19 infection.  Per daughter, patient sustained skin injury during this hospitalization, and he has had significant pain, discomfort and progressive foul-smelling discharge from sacral wound over the course of 4 to 5 days before coming back to ED.  No report of constitutional symptoms, chest pain or dyspnea.  In ED, hypotensive to 82/51.  In RVR to 140s.  Na 130.  Glucose elevated to 500.  Not in DKA or HHS.  WBC 13.6 with left shift.  CXR without acute finding.  CT abdomen and pelvis ordered.  Cultures obtained.  Admitted with working diagnosis of sepsis due to sacral pressure ulcer wound infection, A-fib with RVR, hypotension.  Cultures obtained.  Patient was started on broad-spectrum antibiotics and IV fluid.  PCCM, cardiology and general surgery consulted.  See plan and assessment for more on hospital course.  Subjective: Seen and examined earlier this morning.  Reports nausea and emesis last night.  He denies abdominal pain although he reported abdominal pain to RN later in the morning.  Denies chest pain or shortness of breath.  Objective: Vitals:   06/30/24 2004 07/01/24 0019 07/01/24 0438 07/01/24 0924  BP: (!) 103/58 105/67 94/61 109/65  Pulse: 73 73 70 76  Resp:   16 (!) 21  Temp: 97.8 F (36.6 C) 98 F (36.7 C) 98.6 F (37 C) 98.5 F  (36.9 C)  TempSrc: Oral Oral  Oral  SpO2: 100% 97% 100% 100%  Weight:      Height:        Examination:  GENERAL: No apparent distress.  Nontoxic. HEENT: MMM.  Vision and hearing grossly intact.  NECK: Supple.  No apparent JVD.  RESP: Some work of breathing as he speaks (not new). CVS:  RRR. Heart sounds normal.  ABD/GI/GU: BS+. Abd soft.  Mild diffuse tenderness. MSK/EXT:  Moves extremities.  Ace wrap over BLE.  No edema swelling proximally SKIN: Sacral, left hip and right heel ulcers.  See pictures under media. NEURO: AA.  Oriented appropriately.  No apparent focal neurodeficit.  BLE weakness. PSYCH: Calm. Normal affect.   Consultants PCCM General Surgery Cardiology Infectious disease Plastic surgery Orthopedic surgery Palliative medicine  Microbiology 4/14-COVID-19 PCR positive.  Flu and RSV PCR nonreactive. 4/14-blood cultures NGTD 6/4-sacral wound culture with Pseudomonas aeruginosa and Klebsiella pneumoniae 7/14-pleural fluid culture NGTD   Assessment and plan: Severe sepsis due to sacral pressure wound infection: Present on arrival.  Presents with generalized weakness, poor appetite, hypotension pain and drainage from sacral wound.  Had leukocytosis, tachycardia, hypotension and lactic acidosis on presentation.    -Culture data as above. -4/14-CT showed sacral wound extension to the right gluteus muscle without signs of osteomyelitis. -4/15-underwent bedside debridement of sacral decubitus wound by general surgery -4/21-ID consulted and recommended continuing broad-spectrum antibiotics that he completed on 6/13. 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 -6/17- ID recommended continuing  holding off antibiotics since he is HDS, afebrile without leukocytosis. -6/17-evaluated by plastic surgery.  Sacral wound felt to be clean.  Wound care recommended -7/13-7/13-CT showed similar appearance of  osteomyelitis underlying sacral decubitus ulcer.  -7/13-plastic surgery recommended for continued wound care and no surgical management. -7/14-Ortho evaluated patient for left hip wound.  Debulked and recommended optimizing nutrition -8/5- Surgery recommended hydrotherapy for left hip wound. Surgical debridement if not successful -8/5-wound care and instruction updated  -Optimize nutrition-see dietitian instruction below. -Overall, difficult case due to patient's nutritional status and limited mobility -Schedule Tylenol  every 6 hours while awake for pain -Oxycodone  2.5 mg every 4 hours as needed for severe pain and before dressing change    Chronic HFrEF/cardiac amyloidosis/NICM/hypotension: Hypotension resolved.  Recurrent pleural effusion and anasarca partly due to nutritional status and hypoalbuminemia.  Not on diuretics at home.  Was on p.o. Lasix .  Received IV Lasix  with albumin  yesterday.  About 1.3 L UOP overnight.  BNP elevated to 1900 (no prior value for comparison.  Seems to have congestive hepatopathy as well. -Increase p.o. Lasix  to 40 mg daily -Continue Tafamidis  and vutrisiran . -Continue with low dose lasix  -Ace wrap for BLE edema   Recurrent bilateral pleural effusion/anasarca: Likely due to CHF, poor nutritional status/hypoalbuminemia.  Has some work of breathing as he speaks but saturating at 100% on RA.  Denies chest pain or SOB. -6/18-left-sided thoracentesis by IR.  Body fluid culture without any growth.   -7/14-bilateral thoracentesis by pulmonology.  Fluid transudative and culture negative -7/30-right thoracocentesis with removal of 500 mL of pleural fluid by IR.   -Diuretics as above. -Remains on room air   IDDM-2 with hyperglycemia: A1c 8.4% on 4/3. Recent Labs  Lab 06/30/24 0816 06/30/24 1218 06/30/24 1244 06/30/24 1631 06/30/24 2003  GLUCAP 87 65* 75 129* 146*  -Continue SSI-sensitive -Continue semaglutide 8 units daily.  PAF/flutter with RVR: Rate  controlled. -Continue amiodarone  and Eliquis  Eliquis  -Cardiology signed off.   History of CVA: Stable -On Eliquis , Lipitor.    GERD: -Continue PPI   Anemia of chronic disease: Stable -Monitor intermittently   Hypothyroidism:  -Continue levothyroxine   Nausea/vomiting/abdominal pain: Could be due to congestive hepatopathy. -Increase Lasix  to 40 mg daily -Check abdominal US .  Elevated LFT: Congestive hepatopathy? - Check RUQ US . - Diuretics as above.  Anxiety and adjustment disorder: -Psychiatry consulted on 7/7 and recommended continuing Zoloft  and Atarax .   Constipation: - Bowel regimen.    Goals of care: CODE STATUS DNR. 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  Palliative care consulted on 4/20.  Disposition: Per family, patient lives alone before prior hospitalization.  Family not interested in SNF or LTACH.  Also do not feel he is ready to be discharged until his wounds are down to stage II.   Severe malnutrition Body mass index is 19.16 kg/m. Nutrition Problem: Severe Malnutrition Etiology: chronic illness Signs/Symptoms: energy intake < or equal to 75% for > or equal to 1 month, severe muscle depletion Interventions: Ensure Enlive (each supplement provides 350kcal and 20 grams of protein), Magic cup, Hormel Shake, MVI, Juven, Other (Comment) (vitamin C )  Multiple pressure skin injuries: Pressure Injury 02/26/24 Coccyx Mid Stage 4 - Full thickness tissue loss with exposed bone, tendon or muscle. (Active)  02/26/24 0916  Location: Coccyx  Location Orientation: Mid  Staging: Stage 4 - Full thickness tissue loss with exposed bone, tendon or muscle.  Wound Description (Comments):   DO NOT USE:  Present on Admission: Yes  Dressing Type -- (Aquacell, mepilex) 06/28/24 0930     Pressure Injury 02/26/24 Toe (Comment  which one) Anterior;Right Deep Tissue Pressure Injury -  Purple or maroon localized area of discolored intact skin or blood-filled blister due to damage of underlying soft tissue from pressure and/or shear. right gre (Active)  02/26/24 1215  Location: Toe (Comment  which one)  Location Orientation: Anterior;Right  Staging: Deep Tissue Pressure Injury - Purple or maroon localized area of discolored intact skin or blood-filled blister due to damage of underlying soft tissue from pressure and/or shear.  Wound Description (Comments): right great toe  DO NOT USE:  Present on Admission:   Dressing Type None 06/29/24 0615     Pressure Injury 03/06/24 Hip Anterior;Left;Proximal Unstageable - Full thickness tissue loss in which the base of the injury is covered by slough (yellow, tan, gray, green or brown) and/or eschar (tan, brown or black) in the wound bed. unstageable (Active)  03/06/24 1612  Location: Hip  Location Orientation: Anterior;Left;Proximal  Staging: Unstageable - Full thickness tissue loss in which the base of the injury is covered by slough (yellow, tan, gray, green or brown) and/or eschar (tan, brown or black) in the wound bed.  Wound Description (Comments): unstageable  DO NOT USE:  Present on Admission: No  Dressing Type Other (Comment);Foam - Lift dressing to assess site every shift;Gauze (Comment);Santyl  (vashe, 2  Kling) 06/28/24 0900     Pressure Injury 03/06/24 Shoulder Left Unstageable - Full thickness tissue loss in which the base of the injury is covered by slough (yellow, tan, gray, green or brown) and/or eschar (tan, brown or black) in the wound bed. unstageable (Active)  03/06/24 1613  Location: Shoulder  Location Orientation: Left  Staging: Unstageable - Full thickness tissue loss in which the base of the injury is covered by slough (yellow, tan, gray, green or brown) and/or eschar (tan, brown or black) in the wound bed.  Wound Description (Comments): unstageable  DO NOT USE:  Present on Admission: No     Pressure Injury  04/23/24 Heel Right Unstageable - Full thickness tissue loss in which the base of the injury is covered by slough (yellow, tan, gray, green or brown) and/or eschar (tan, brown or black) in the wound bed. DTPI evolving into unstageable (Active)  04/23/24   Location: Heel  Location Orientation: Right  Staging: Unstageable - Full thickness tissue loss in which the base of the injury is covered by slough (yellow, tan, gray, green or brown) and/or eschar (tan, brown or black) in the wound bed.  Wound Description (Comments): DTPI evolving into unstageable  DO NOT USE:  Present on Admission: No     Pressure Injury 04/23/24 Heel Left Deep Tissue Pressure Injury - Purple or maroon localized area of discolored intact skin or blood-filled blister due to damage of underlying soft tissue from pressure and/or shear. (Active)  04/23/24   Location: Heel  Location Orientation: Left  Staging: Deep Tissue Pressure Injury - Purple or maroon localized area of discolored intact skin or blood-filled blister due to damage of underlying soft tissue from pressure and/or shear.  Wound Description (Comments):   DO NOT USE:  Present on Admission: No   DVT prophylaxis:  SCDs Start: 03/04/24 2151 Place TED hose Start: 03/04/24 2151 apixaban  (ELIQUIS ) tablet 5 mg  Code Status: DNR Family Communication: None at bedside. Level of care: Telemetry Medical Status is: Inpatient Remains inpatient appropriate because: Complicated disposition   Final disposition: Yet to  be determined   55 minutes with more than 50% spent in reviewing records, counseling patient/family and coordinating care.   Sch Meds:  Scheduled Meds:  (feeding supplement) PROSource Plus  30 mL Oral BID BM   acetaminophen   650 mg Oral Q8H   amiodarone   200 mg Oral Daily   apixaban   5 mg Oral BID   atorvastatin   40 mg Oral Daily   calcium  carbonate  1 tablet Oral TID   collagenase    Topical Daily   diclofenac  Sodium  4 g Topical QID   feeding  supplement  237 mL Oral BID BM   ferrous sulfate   325 mg Oral QHS   furosemide   40 mg Oral Daily   guaiFENesin   600 mg Oral BID   insulin  aspart  0-9 Units Subcutaneous TID WC   insulin  glargine-yfgn  8 Units Subcutaneous Daily   levothyroxine   50 mcg Oral Q0600   lidocaine   1 patch Transdermal Q24H   liver oil-zinc  oxide   Topical BID   multivitamin with minerals  1 tablet Oral Daily   nutrition supplement (JUVEN)  1 packet Oral BID BM   mouth rinse  15 mL Mouth Rinse 4 times per day   pantoprazole   40 mg Oral Daily   polyethylene glycol  17 g Oral BID   potassium chloride   20 mEq Oral Daily   senna-docusate  1 tablet Oral QHS   sertraline   50 mg Oral Daily   sodium chloride  flush  10-40 mL Intracatheter Q12H   Tafamidis   1 capsule Oral Daily   vitamin A   10,000 Units Oral Daily   vutrisiran  sodium  25 mg Subcutaneous Q90 days   Continuous Infusions:  dextrose      PRN Meds:.acetaminophen , bismuth  subsalicylate, butalbital -acetaminophen -caffeine , dextrose , food thickener, hydrOXYzine , lip balm, meclizine , ondansetron  **OR** ondansetron  (ZOFRAN ) IV, mouth rinse, oxyCODONE , artificial tears  Antimicrobials: Anti-infectives (From admission, onward)    Start     Dose/Rate Route Frequency Ordered Stop   04/27/24 1615  cefTAZidime  (FORTAZ ) 2 g in sodium chloride  0.9 % 100 mL IVPB        2 g 200 mL/hr over 30 Minutes Intravenous Every 8 hours 04/27/24 1518 05/03/24 2359   03/16/24 1015  cefTRIAXone  (ROCEPHIN ) 2 g in sodium chloride  0.9 % 100 mL IVPB        2 g 200 mL/hr over 30 Minutes Intravenous Every 24 hours 03/16/24 0929 03/20/24 1219   03/16/24 1015  doxycycline  (VIBRA -TABS) tablet 100 mg        100 mg Oral Every 12 hours 03/16/24 0929 03/20/24 2131   03/14/24 1000  metroNIDAZOLE  (FLAGYL ) tablet 500 mg        500 mg Oral Every 12 hours 03/14/24 0803 03/19/24 2219   03/13/24 1000  cefadroxil  (DURICEF) capsule 1,000 mg  Status:  Discontinued        1,000 mg Oral 2 times daily  03/12/24 1632 03/16/24 0929   03/07/24 2200  metroNIDAZOLE  (FLAGYL ) tablet 500 mg        500 mg Oral Every 12 hours 03/07/24 0840 03/11/24 2258   03/05/24 1930  vancomycin  (VANCOREADY) IVPB 1250 mg/250 mL  Status:  Discontinued        1,250 mg 166.7 mL/hr over 90 Minutes Intravenous Every 24 hours 03/04/24 2205 03/04/24 2224   03/05/24 1930  Vancomycin  (VANCOCIN ) 1,250 mg in sodium chloride  0.9 % 250 mL IVPB  Status:  Discontinued        1,250 mg 166.7 mL/hr over 90  Minutes Intravenous Every 24 hours 03/04/24 2224 03/05/24 0717   03/05/24 1930  vancomycin  (VANCOREADY) IVPB 1500 mg/300 mL  Status:  Discontinued        1,500 mg 150 mL/hr over 120 Minutes Intravenous Every 24 hours 03/05/24 0717 03/12/24 1636   03/05/24 1400  ceFEPIme  (MAXIPIME ) 2 g in sodium chloride  0.9 % 100 mL IVPB  Status:  Discontinued        2 g 200 mL/hr over 30 Minutes Intravenous Every 8 hours 03/05/24 0717 03/12/24 1632   03/05/24 0600  ceFEPIme  (MAXIPIME ) 2 g in sodium chloride  0.9 % 100 mL IVPB  Status:  Discontinued        2 g 200 mL/hr over 30 Minutes Intravenous Every 12 hours 03/04/24 2205 03/05/24 0717   03/05/24 0000  metroNIDAZOLE  (FLAGYL ) IVPB 500 mg  Status:  Discontinued        500 mg 100 mL/hr over 60 Minutes Intravenous Every 12 hours 03/04/24 2206 03/04/24 2209   03/04/24 1730  aztreonam (AZACTAM) 2 g in sodium chloride  0.9 % 100 mL IVPB  Status:  Discontinued        2 g 200 mL/hr over 30 Minutes Intravenous  Once 03/04/24 1715 03/04/24 1724   03/04/24 1730  metroNIDAZOLE  (FLAGYL ) IVPB 500 mg  Status:  Discontinued        500 mg 100 mL/hr over 60 Minutes Intravenous Every 12 hours 03/04/24 1715 03/07/24 0840   03/04/24 1730  vancomycin  (VANCOREADY) IVPB 1500 mg/300 mL        1,500 mg 150 mL/hr over 120 Minutes Intravenous  Once 03/04/24 1715 03/04/24 2153   03/04/24 1730  ceFEPIme  (MAXIPIME ) 2 g in sodium chloride  0.9 % 100 mL IVPB        2 g 200 mL/hr over 30 Minutes Intravenous  Once 03/04/24  1724 03/04/24 1826        I have personally reviewed the following labs and images: CBC: Recent Labs  Lab 07/01/24 0414  WBC 10.0  HGB 11.4*  HCT 33.6*  MCV 85.7  PLT 224    BMP &GFR Recent Labs  Lab 07/01/24 0414  NA 137  K 4.9  CL 104  CO2 24  GLUCOSE 88  BUN 47*  CREATININE 1.22  CALCIUM  9.0  MG 1.9  PHOS 3.1    Estimated Creatinine Clearance: 40.4 mL/min (by C-G formula based on SCr of 1.22 mg/dL). Liver & Pancreas: Recent Labs  Lab 07/01/24 0414  AST 67*  ALT 78*  ALKPHOS 184*  BILITOT 1.5*  PROT 5.9*  ALBUMIN  2.8*   No results for input(s): LIPASE, AMYLASE in the last 168 hours. No results for input(s): AMMONIA in the last 168 hours. Diabetic: No results for input(s): HGBA1C in the last 72 hours. Recent Labs  Lab 06/30/24 0816 06/30/24 1218 06/30/24 1244 06/30/24 1631 06/30/24 2003  GLUCAP 87 65* 75 129* 146*   Cardiac Enzymes: No results for input(s): CKTOTAL, CKMB, CKMBINDEX, TROPONINI in the last 168 hours. No results for input(s): PROBNP in the last 8760 hours. Coagulation Profile: No results for input(s): INR, PROTIME in the last 168 hours. Thyroid  Function Tests: No results for input(s): TSH, T4TOTAL, FREET4, T3FREE, THYROIDAB in the last 72 hours. Lipid Profile: No results for input(s): CHOL, HDL, LDLCALC, TRIG, CHOLHDL, LDLDIRECT in the last 72 hours. Anemia Panel: No results for input(s): VITAMINB12, FOLATE, FERRITIN, TIBC, IRON, RETICCTPCT in the last 72 hours. Urine analysis:    Component Value Date/Time   COLORURINE STRAW (A) 03/04/2024  2139   APPEARANCEUR CLEAR 03/04/2024 2139   LABSPEC 1.016 03/04/2024 2139   PHURINE 6.0 03/04/2024 2139   GLUCOSEU >=500 (A) 03/04/2024 2139   HGBUR SMALL (A) 03/04/2024 2139   BILIRUBINUR NEGATIVE 03/04/2024 2139   KETONESUR NEGATIVE 03/04/2024 2139   PROTEINUR 30 (A) 03/04/2024 2139   UROBILINOGEN 0.2 05/05/2015 1443   NITRITE  NEGATIVE 03/04/2024 2139   LEUKOCYTESUR NEGATIVE 03/04/2024 2139   Sepsis Labs: Invalid input(s): PROCALCITONIN, LACTICIDVEN  Microbiology: No results found for this or any previous visit (from the past 240 hours).  Radiology Studies: No results found.    Earl [name] T. Kamisha Ell [name]  If 7PM-7AM, please contact night-coverage [email or web address] 07/01/2024, 10:49 AM

## 2024-07-01 NOTE — Progress Notes (Signed)
 Nutrition Follow-up  DOCUMENTATION CODES:   Severe malnutrition in context of chronic illness  INTERVENTION:  Continue dysphagia 1 diet as ordered Continue Juven BID to support ongoing wound healing needs Continue mixing 30 ml ProSource Plus BID with Juven, each supplement provides 100 kcals and 15 grams protein.  Continue Ensure Plus High Protein PO BID, each supplement provides 350 kcal and 20 grams of protein. Check weights weekly Pt with continued deficiency of Vitamin A , re-ordered supplementation and will re-check Vit A/CRP x 14 days (08/14)   NUTRITION DIAGNOSIS:   Severe Malnutrition related to chronic illness as evidenced by energy intake < or equal to 75% for > or equal to 1 month, severe muscle depletion.  Ongoing  GOAL:   Patient will meet greater than or equal to 90% of their needs  Progressing  MONITOR:   PO intake, Supplement acceptance, Weight trends, Skin  REASON FOR ASSESSMENT:   Consult Assessment of nutrition requirement/status  ASSESSMENT:   83 year old male who presented to the ED on 03/04/24 from [facility] due to sacral pain. PMH of T2DM, HTN, HLD, CHF, atrial fibrillation, stroke with right-sided weakness, GERD, BPH, hypothyroidism, MGUS, transthyretin associated amyloid cardiomyopathy, chronic pancytopenia, recent hospital admission for COVID-19. Pt admitted with sepsis secondary to sacral pressure injury infection.  Pt with no noted n/v/c/d. Last BM 8/8. Documented average PO intake continues to be ~50%.  Nursing obtained updated weight showing continued gradual loss; goal of maintenance and/or gain toward BMI>/=23. Pt continues with mod-severe edema, which could be concealing additional losses. Previous thoracentesis, now increased edema; suspect fluid fluctuations impacting weight. From MD progress note today, Per family, patient lives alone before prior hospitalization. Family not interested in SNF or LTACH. Also do not feel he is ready to be  discharged until his wounds are down to stage II.  Continue with current nutritional interventions, will continue to monitor, RDN available prn.   Labs BUN 47 ALK 184 Albumin  2.8 AST 67 ALT 78 Prealbumin <5 GFR 59 H/H 11.4/33.6  Medications  (feeding supplement) PROSource Plus  30 mL Oral BID BM   acetaminophen   650 mg Oral Q8H   amiodarone   200 mg Oral Daily   apixaban   5 mg Oral BID   atorvastatin   40 mg Oral Daily   calcium  carbonate  1 tablet Oral TID   collagenase    Topical Daily   diclofenac  Sodium  4 g Topical QID   feeding supplement  237 mL Oral BID BM   ferrous sulfate   325 mg Oral QHS   furosemide   40 mg Oral Daily   guaiFENesin   600 mg Oral BID   insulin  aspart  0-9 Units Subcutaneous TID WC   insulin  glargine-yfgn  8 Units Subcutaneous Daily   levothyroxine   50 mcg Oral Q0600   lidocaine   1 patch Transdermal Q24H   liver oil-zinc  oxide   Topical BID   multivitamin with minerals  1 tablet Oral Daily   nutrition supplement (JUVEN)  1 packet Oral BID BM   mouth rinse  15 mL Mouth Rinse 4 times per day   pantoprazole   40 mg Oral Daily   polyethylene glycol  17 g Oral BID   potassium chloride   20 mEq Oral Daily   senna-docusate  1 tablet Oral QHS   sertraline   50 mg Oral Daily   sodium chloride  flush  10-40 mL Intracatheter Q12H   Tafamidis   1 capsule Oral Daily   vitamin A   10,000 Units Oral Daily   vutrisiran   sodium  25 mg Subcutaneous Q90 days     NUTRITION - FOCUSED PHYSICAL EXAM:  Flowsheet Row Most Recent Value  Orbital Region Moderate depletion  Upper Arm Region Moderate depletion  Thoracic and Lumbar Region Mild depletion  Buccal Region Mild depletion  Temple Region Moderate depletion  Clavicle Bone Region Severe depletion  Clavicle and Acromion Bone Region Severe depletion  Scapular Bone Region Severe depletion  Dorsal Hand Mild depletion  Patellar Region Moderate depletion  Anterior Thigh Region Mild depletion  Posterior Calf Region  Moderate depletion  Edema (RD Assessment) Mild  Hair Reviewed  Eyes Reviewed  Mouth Reviewed  Skin Reviewed  Nails Reviewed    Diet Order:   Diet Order             DIET - DYS 1 Room service appropriate? No; Fluid consistency: Thin  Diet effective now                   EDUCATION NEEDS:   No education needs have been identified at this time  Skin:  Skin Assessment: Skin Integrity Issues: Skin Integrity Issues:: Stage IV, Unstageable DTI: Heel Stage II: - Stage IV: Sacrum Unstageable: L hip, L shoulder, R heel, L medial foot Other: -  Last BM:  8/8  Height:   Ht Readings from Last 1 Encounters:  03/04/24 5' 11 (1.803 m)    Weight:   Wt Readings from Last 1 Encounters:  06/27/24 62.3 kg    Ideal Body Weight:  78.2 kg  BMI:  Body mass index is 19.16 kg/m.  Estimated Nutritional Needs:   Kcal:  1900-2100  Protein:  100-115g  Fluid:  >/=1.9L    Tasharra Nodine Daml-Budig, RDN, LDN Registered Dietitian Nutritionist RD Inpatient Contact Info in [place]

## 2024-07-01 NOTE — Significant Event (Signed)
 Was notified by the nurse that patient's ultrasound abdomen shows features concerning for cholecystitis.  On exam at bedside patient has abdominal tenderness mostly in the upper quadrants.  Patient's daughter at the bedside states that patient has been eating poorly last few days.  I reviewed patient's labs notes medications.  Discussed with on-call general surgeon Dr. Tanda.  Dr. Tanda advised getting HIDA scan.  If HIDA scan is positive for any signs of acute cholecystitis to reconsult general surgery.  Redia Cleaver

## 2024-07-01 NOTE — Plan of Care (Signed)
  Problem: Respiratory: Goal: Will maintain a patent airway Outcome: Progressing   Problem: Coping: Goal: Ability to adjust to condition or change in health will improve Outcome: Progressing

## 2024-07-02 ENCOUNTER — Inpatient Hospital Stay (HOSPITAL_COMMUNITY)

## 2024-07-02 DIAGNOSIS — L89154 Pressure ulcer of sacral region, stage 4: Secondary | ICD-10-CM | POA: Diagnosis not present

## 2024-07-02 DIAGNOSIS — I483 Typical atrial flutter: Secondary | ICD-10-CM | POA: Diagnosis not present

## 2024-07-02 DIAGNOSIS — A419 Sepsis, unspecified organism: Secondary | ICD-10-CM | POA: Diagnosis not present

## 2024-07-02 DIAGNOSIS — Z8679 Personal history of other diseases of the circulatory system: Secondary | ICD-10-CM | POA: Diagnosis not present

## 2024-07-02 HISTORY — PX: IR THORACENTESIS ASP PLEURAL SPACE W/IMG GUIDE: IMG5380

## 2024-07-02 LAB — COMPREHENSIVE METABOLIC PANEL WITH GFR
ALT: 85 U/L — ABNORMAL HIGH (ref 0–44)
AST: 90 U/L — ABNORMAL HIGH (ref 15–41)
Albumin: 2.7 g/dL — ABNORMAL LOW (ref 3.5–5.0)
Alkaline Phosphatase: 197 U/L — ABNORMAL HIGH (ref 38–126)
Anion gap: 10 (ref 5–15)
BUN: 45 mg/dL — ABNORMAL HIGH (ref 8–23)
CO2: 25 mmol/L (ref 22–32)
Calcium: 9 mg/dL (ref 8.9–10.3)
Chloride: 105 mmol/L (ref 98–111)
Creatinine, Ser: 1.25 mg/dL — ABNORMAL HIGH (ref 0.61–1.24)
GFR, Estimated: 57 mL/min — ABNORMAL LOW
Glucose, Bld: 93 mg/dL (ref 70–99)
Potassium: 4.9 mmol/L (ref 3.5–5.1)
Sodium: 140 mmol/L (ref 135–145)
Total Bilirubin: 1.6 mg/dL — ABNORMAL HIGH (ref 0.0–1.2)
Total Protein: 5.6 g/dL — ABNORMAL LOW (ref 6.5–8.1)

## 2024-07-02 LAB — GLUCOSE, CAPILLARY
Glucose-Capillary: 62 mg/dL — ABNORMAL LOW (ref 70–99)
Glucose-Capillary: 106 mg/dL — ABNORMAL HIGH (ref 70–99)
Glucose-Capillary: 172 mg/dL — ABNORMAL HIGH (ref 70–99)
Glucose-Capillary: 205 mg/dL — ABNORMAL HIGH (ref 70–99)

## 2024-07-02 LAB — MAGNESIUM: Magnesium: 2.1 mg/dL (ref 1.7–2.4)

## 2024-07-02 MED ORDER — TRIPLE ANTIBIOTIC 3.5-400-5000 EX OINT
TOPICAL_OINTMENT | Freq: Every day | CUTANEOUS | Status: DC
  Administered 2024-07-02 – 2024-07-29 (×24): 1 via CUTANEOUS
  Filled 2024-07-02 (×31): qty 1

## 2024-07-02 MED ORDER — LIDOCAINE-EPINEPHRINE 1 %-1:100000 IJ SOLN
INTRAMUSCULAR | Status: AC
  Filled 2024-07-02: qty 1

## 2024-07-02 MED ORDER — FUROSEMIDE 20 MG PO TABS
20.0000 mg | ORAL_TABLET | Freq: Every day | ORAL | Status: DC
  Administered 2024-07-02 (×2): 20 mg via ORAL
  Filled 2024-07-02: qty 1

## 2024-07-02 MED ORDER — LIDOCAINE-EPINEPHRINE (PF) 1 %-1:200000 IJ SOLN
30.0000 mL | Freq: Once | INTRAMUSCULAR | Status: DC
  Filled 2024-07-02: qty 30

## 2024-07-02 MED ORDER — INSULIN GLARGINE-YFGN 100 UNIT/ML ~~LOC~~ SOLN
5.0000 [IU] | Freq: Every day | SUBCUTANEOUS | Status: DC
  Administered 2024-07-03 – 2024-07-16 (×15): 5 [IU] via SUBCUTANEOUS
  Filled 2024-07-02 (×15): qty 0.05

## 2024-07-02 MED ORDER — ACETAMINOPHEN 325 MG PO TABS
650.0000 mg | ORAL_TABLET | Freq: Four times a day (QID) | ORAL | Status: DC | PRN
  Administered 2024-07-02 – 2024-08-20 (×98): 650 mg via ORAL
  Filled 2024-07-02 (×100): qty 2

## 2024-07-02 NOTE — Consult Note (Addendum)
 WOC Nurse wound follow up Wound type: Unstageable pressure injury left shoulder Unstageable Pressure Injury: left trochanter Unstageable Pressure Injury; right heel Stage 4 Pressure Injury; sacrum Open area to left medial foot : healed Measurement: Left shoulder 1 cm x 1 cm Left trochanter: 4 cm x 3 cm x 2.5 cm  Right heel: 3 cm x 3cm Sacrum: 6 cm x 3 cm x 0.5 cm with 2 cm undermining from 12 o'clock to 3 o'clock Wound bed: Left shoulder: 50% red,moist, 50% hard brown eschar Left trochanter: 95% covered by yellow slough, 5 % pink moist Right heel: 100% covered by black eschar Sacrum: 100% red, moist Drainage (amount, consistency, odor)  Shoulder: none Trochanter: yellow/blue, moderate amount Right heel: none Sacrum: serosanguinous moderate amount  Periwound: intact Dressing procedure/placement/frequency: 1- Left shoulder Unstageable  Cleanse with NS, pat dry, apply triple antibiotic ointment to wound and cover with silicone foam dressing. Change daily. 2- Left trochanter: Cleanse with Vashe (lawson # [id number]) allow to aid dry.  Apply 1/4 thick layer of Santyl  to wound bed, top with saline moist gauze. Top with silicone foam.  Change daily.     3 - Sacral Stage 4 Pressure Injury  Cleanse with Vashe#[id number]. Apply Aquacel Ag+ (silver hydrofiber) Lawson # [phone] in the wound bed and fill the undermining using a swab, cover with sacral foam dressing, change daily and PRN soilage  3- Right heel; Unstageable; paint with betadine and leave open to air; offload with pillows or Prevalon boots at all times   Off load left heel with Prevalon boots at all times.   LALM in place for moisture management and pressure redistribution.  Plan of care discussed with daughter, Para Mathison, who requests a follow up scan of trochanter wound.  This was communicated to attending provider.  WOC Nurse team will follow with you and see patient within 10 days for wound assessments.  Please notify WOC nurses  of any acute changes in the wounds or any new areas of concern  Doyal Polite, RN, MSN, [facility] WOC Team

## 2024-07-02 NOTE — Progress Notes (Signed)
 PT Cancellation Note  Patient Details Name: Earl [name] MRN: [id number] DOB: 1941-06-02   Cancelled Treatment:    Reason Eval/Treat Not Completed: (P) Patient at procedure or test/unavailable Pt is off the floor for HIDA scan. PT will follow back for hydro as able.   [name] PT, DPT Acute Rehabilitation Services Please use secure chat or  Call Office [phone]    [name] [facility] 07/02/2024, 10:40 AM

## 2024-07-02 NOTE — Plan of Care (Signed)
   Problem: Fluid Volume: Goal: Hemodynamic stability will improve Outcome: Progressing

## 2024-07-02 NOTE — Plan of Care (Signed)
   Medical records reviewed including progress notes, labs, imaging.  Noted LFTs increasing again with subsequent RUQ US  showing concern for cholecystitis.  HIDA scan ordered for today at the recommendation of general surgery.  Ongoing efforts to improve patient's nutritional status in the context of fluctuating fluid status and CHF.  Creatinine increased today (1.25 from 1.22) and per primary attending's note, plan is to hold Lasix .  Further, CXR yesterday showed bilateral pleural effusions, right greater than left.  Patient is status post repeat right thoracentesis today.    Goals of care remain clear for continued medical interventions and hospital is the preferred setting for this care. Hope is for improvement of patient's wounds then transition to home with outpatient palliative care. Patient is satisfied with current symptom management plan.   PMT will continue to follow peripherally. No other acute needs at this time.   Lataisha Colan, PA-C Palliative Medicine Team  Team Phone # [phone]   NO CHARGE

## 2024-07-02 NOTE — Procedures (Signed)
 PROCEDURE SUMMARY:  Successful US  guided right thoracentesis. Yielded 800 ml of amber-colored fluid. Pt tolerated procedure well. No immediate complications.  Specimen not sent for labs. CXR ordered; results pending  EBL < 2 mL  Warren JONELLE Dais, NP 07/02/2024 11:59 AM

## 2024-07-02 NOTE — Progress Notes (Signed)
 Physical Therapy Wound Treatment Patient Details  Name: Earl [name] MRN: [id number] Date of Birth: 04-02-1941  Today's Date: 07/02/2024 Time: 8652-8559 Time Calculation (min): 53 min  Subjective  Subjective Assessment Subjective: WOC RN in room for assessment prior to PT wound session Patient and Family Stated Goals: Heal wounds Date of Onset:  (unknown) Prior Treatments: Dressing changes, off weighting  Pain Score:  2/10 Pt able to sleep through treatment session.   Wound Assessment  Pressure Injury 03/06/24 Hip Anterior;Left;Proximal Unstageable - Full thickness tissue loss in which the base of the injury is covered by slough (yellow, tan, gray, green or brown) and/or eschar (tan, brown or black) in the wound bed. unstageable (Active)  Wound Image   07/02/24 1400  Dressing Type Other (Comment);Foam - Lift dressing to assess site every shift;Gauze (Comment);Santyl ;Barrier Film (skin prep) 07/02/24 1400  Dressing Clean, Dry, Intact 07/02/24 1400  Dressing Change Frequency Daily 07/02/24 1400  State of Healing Non-healing 07/02/24 1400  Site / Wound Assessment Yellow 07/02/24 1400  % Wound base Red or Granulating 10% 07/02/24 1400  % Wound base Yellow/Fibrinous Exudate 90% 07/02/24 1400  % Wound base Black/Eschar 0% 06/28/24 0900  % Wound base Other/Granulation Tissue (Comment) 0% 06/28/24 0900  Peri-wound Assessment Denuded;Induration;Pink;Erythema (non-blanchable) 07/02/24 1400  Wound Length (cm) 4.1 cm 07/02/24 1400  Wound Width (cm) 3.2 cm 07/02/24 1400  Wound Depth (cm) 4.2 cm 07/02/24 1400  Wound Surface Area (cm^2) 13.12 cm^2 07/02/24 1400  Wound Volume (cm^3) 55.1 cm^3 07/02/24 1400  Tunneling (cm) 0 05/17/24 1457  Undermining (cm) 12 o'clock 0.5, 1 o'clock 1.3, 2 o'clock 1.1, 3 o'clock 0.7, 6'o'clock 0.2, 9 o'clock 0.2 07/02/24 1400  Margins Unattached edges (unapproximated) 07/02/24 1400  Drainage Amount Moderate 07/02/24 1400  Drainage Description Odor -  foul;Serous 07/02/24 1400  Treatment Cleansed;Debridement (Selective);Irrigation 07/02/24 1400   Selective Debridement (non-excisional) Selective Debridement (non-excisional) - Location: L hip Selective Debridement (non-excisional) - Tools Used: Forceps, Scalpel, Scissors Selective Debridement (non-excisional) - Tissue Removed: Slough & non-viable adipose tissue    Wound Assessment and Plan  Wound Therapy - Assess/Plan/Recommendations Wound Therapy - Clinical Statement: Pt wound continues to evolve, the wound is 1 cm deeper than it was last week and viable wound bed has not been visualize yet. Wound is also progressing in the 1 - 2 O'clock direction. Pt will continue to benefit from selective debridement to reduce bioburden and promote healing. Wound Therapy - Functional Problem List: Decreased tolerance for position changes or OOB. Factors Delaying/Impairing Wound Healing: Diabetes Mellitus, Immobility, Infection - systemic/local, Multiple medical problems Hydrotherapy Plan: Debridement, Dressing change, Patient/family education Wound Therapy - Frequency: 2X / week Wound Therapy - Follow Up Recommendations: dressing changes by RN  Wound Therapy Goals- Improve the function of patient's integumentary system by progressing the wound(s) through the phases of wound healing (inflammation - proliferation - remodeling) by: Wound Therapy Goals - Improve the function of patient's integumentary system by progressing the wound(s) through the phases of wound healing by: Decrease Necrotic Tissue to: 20% Decrease Necrotic Tissue - Progress: Progressing toward goal Increase Granulation Tissue to: 80% Increase Granulation Tissue - Progress: Progressing toward goal Goals/treatment plan/discharge plan were made with and agreed upon by patient/family: Yes Time For Goal Achievement: 7 days Wound Therapy - Potential for Goals: Fair  Goals will be updated until maximal potential achieved or discharge criteria  met.  Discharge criteria: when goals achieved, discharge from hospital, MD decision/surgical intervention, no progress towards goals, refusal/missing three consecutive treatments without notification  or medical reason.  GP     Charges PT Wound Care Charges $Wound Debridement up to 20 cm: < or equal to 20 cm $PT Hydrotherapy Dressing: 3 dressings     Lorne Winkels B. [name] PT, DPT Acute Rehabilitation Services Please use secure chat or  Call Office [phone]   [name] [facility] 07/02/2024, 3:03 PM

## 2024-07-02 NOTE — Plan of Care (Signed)
  Problem: Clinical Measurements: Goal: Signs and symptoms of infection will decrease Outcome: Progressing   Problem: Respiratory: Goal: Will maintain a patent airway Outcome: Progressing   Problem: Nutritional: Goal: Maintenance of adequate nutrition will improve Outcome: Progressing Note: Encouraged oral intake.    Problem: Elimination: Goal: Will not experience complications related to urinary retention Outcome: Progressing   Problem: Safety: Goal: Ability to remain free from injury will improve Outcome: Progressing   Problem: Skin Integrity: Goal: Risk for impaired skin integrity will decrease Outcome: Progressing

## 2024-07-02 NOTE — Progress Notes (Signed)
 PROGRESS NOTE  Earl [name] FMW:[id number] DOB: 09-Feb-1941   PCP: [name], MD  Patient is from: SNF  DOA: 03/04/2024 LOS: 120  Chief complaints No chief complaint on file.    Brief Narrative / Interim history: 83 year old M with PMH of PAF on Eliquis , HFmrEF, amyloid cardiomyopathy, NIDDM-2, CVA with right-sided weakness, stage IV sacral wound, pancytopenia, MGUS, HTN, HLD and hypothyroidism brought to ED by EMS from [facility] rehab due to pain in sacral area and shortness of breath 4 days after he was discharged from the hospital.   Patient was hospitalized from 3/26-4/10 with generalized weakness, encephalopathy and COVID-19 infection.  Per daughter, patient sustained skin injury during this hospitalization, and he has had significant pain, discomfort and progressive foul-smelling discharge from sacral wound over the course of 4 to 5 days before coming back to ED.  No report of constitutional symptoms, chest pain or dyspnea.  In ED, hypotensive to 82/51.  In RVR to 140s.  Na 130.  Glucose elevated to 500.  Not in DKA or HHS.  WBC 13.6 with left shift.  CXR without acute finding.  CT abdomen and pelvis ordered.  Cultures obtained.  Admitted with working diagnosis of sepsis due to sacral pressure ulcer wound infection, A-fib with RVR, hypotension.  Cultures obtained.  Patient was started on broad-spectrum antibiotics and IV fluid.  PCCM, cardiology and general surgery consulted.  See plan and assessment for more on hospital course.  Subjective: Seen and examined earlier this morning.  Continues to report right-sided abdominal pain.  No nausea or vomiting today.  Denies shortness of breath or chest pain.  RUQ US  raise concern for acalculous cholecystitis last night.  General surgery consulted.  HIDA scan ordered.  Portable CXR with bilateral pleural effusion, right> left, with worsening aeration and RLL, persistent retrocardiac opacification in LLL and stable cardiac  enlargement.  Objective: Vitals:   07/02/24 0039 07/02/24 0443 07/02/24 0500 07/02/24 0738  BP: 106/73 106/70  109/67  Pulse: 66 70  69  Resp: 18 18  18   Temp: 98 F (36.7 C) 98 F (36.7 C)  (!) 97.5 F (36.4 C)  TempSrc: Oral     SpO2: 97% 98%  97%  Weight:   62 kg   Height:        Examination:  GENERAL: No apparent distress.  Nontoxic. HEENT: MMM.  Vision and hearing grossly intact.  NECK: Supple.  No apparent JVD.  RESP: Some work of breathing as he speaks (not new). CVS:  RRR. Heart sounds normal.  ABD/GI/GU: BS+. Abd soft.  Mild tenderness in RUQ and RLQ. MSK/EXT:  Moves extremities.  Ace wrap over BLE.  No edema swelling proximally SKIN: Sacral, left hip and right heel ulcers.  See pictures under media. NEURO: AA.  Oriented appropriately.  No apparent focal neurodeficit.  BLE weakness. PSYCH: Calm. Normal affect.   Consultants PCCM General Surgery Cardiology Infectious disease Plastic surgery Orthopedic surgery Palliative medicine  Microbiology 4/14-COVID-19 PCR positive.  Flu and RSV PCR nonreactive. 4/14-blood cultures NGTD 6/4-sacral wound culture with Pseudomonas aeruginosa and Klebsiella pneumoniae 7/14-pleural fluid culture NGTD   Assessment and plan: Severe sepsis due to sacral pressure wound infection: Present on arrival.  Presents with generalized weakness, poor appetite, hypotension pain and drainage from sacral wound.  Had leukocytosis, tachycardia, hypotension and lactic acidosis on presentation.    -Culture data as above. -4/14-CT showed sacral wound extension to the right gluteus muscle without signs of osteomyelitis. -4/15-underwent bedside debridement of sacral decubitus wound by  general surgery -4/21-ID consulted and recommended continuing broad-spectrum antibiotics that he completed on 6/13. 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 -6/17- ID recommended continuing holding  off antibiotics since he is HDS, afebrile without leukocytosis. -6/17-evaluated by plastic surgery.  Sacral wound felt to be clean.  Wound care recommended -7/13-7/13-CT showed similar appearance of osteomyelitis underlying sacral decubitus ulcer.  -7/13-plastic surgery recommended for continued wound care and no surgical management. -7/14-Ortho evaluated patient for left hip wound.  Debulked and recommended optimizing nutrition -8/5- Surgery recommended hydrotherapy for left hip wound. Surgical debridement if not successful -8/5-wound care and instruction updated  -Optimize nutrition-see dietitian instruction below. -Overall, difficult case due to patient's nutritional status and limited mobility -Schedule Tylenol  every 6 hours while awake for pain -Oxycodone  2.5 mg every 4 hours as needed for severe pain and before dressing change    Chronic HFrEF/cardiac amyloidosis/NICM/hypotension: Hypotension resolved.  Recurrent pleural effusion and anasarca partly due to nutritional status and hypoalbuminemia.  Not on diuretics at home.  Was on p.o. Lasix .  Received IV Lasix  with albumin  yesterday.  About 1.3 L UOP overnight.  BNP elevated to 1900 (no prior value for comparison.  LFT trended up.  Congestive hepatopathy?  -Backing off of Lasix  due to creatinine. -Continue Tafamidis  and vutrisiran . -Ace wrap for BLE edema   Recurrent bilateral pleural effusion/anasarca: Likely due to CHF, poor nutritional status/hypoalbuminemia.  Has some work of breathing as he speaks but saturating at 100% on RA.  Denies chest pain or SOB. -6/18-left-sided thoracentesis by IR.  Body fluid culture without any growth.   -7/14-bilateral thoracentesis by pulmonology.  Fluid transudative and culture negative -7/30-right thoracocentesis with removal of 500 mL of pleural fluid by IR.   -8/12-right thoracocentesis ordered. -Diuretics as above. -Remains on room air  Possible acalculous cholecystitis:  RUQ US  raise concern  for calculus cholecystitis. Patient reports right-sided abdominal pain but no fever or leukocytosis to suggest infectious process.  Symptoms and US  finding could be due to anasarca/possible congestive hepatopathy.  LFTs slightly up. -General Surgery recommended HIDA scan-in process.  Elevated liver enzymes: Chronic but slightly trended up.  Acute hepatitis panel was negative.  RUQ US  as above.  He has CHF with markedly elevated BNP raising concern for congestive hepatopathy. -Change scheduled Tylenol  to as needed -May have to find alternative for amiodarone  if LFT continues to trend up -Hold Lipitor -Continue monitoring   IDDM-2 with hyperglycemia : A1c 8.4% on 4/3. Recent Labs  Lab 06/30/24 2003 07/01/24 1232 07/01/24 1628 07/01/24 2004 07/02/24 0740  GLUCAP 146* 130* 84 151* 62*  -Continue SSI-sensitive -Decrease Semglee  from 8 to 5 units daily  PAF/flutter with RVR: Rate controlled. -Continue amiodarone  and Eliquis  Eliquis  -May have to find alternative for amiodarone  if LFT continues to trend up -Cardiology signed off.   History of CVA: Stable -On Eliquis .  Hold Lipitor.   GERD: -Continue PPI   Anemia of chronic disease: Stable -Monitor intermittently   Hypothyroidism:  -Continue levothyroxine   Anxiety and adjustment disorder: -Psychiatry consulted on 7/7 and recommended continuing Zoloft  and Atarax .   Constipation: - Bowel regimen.    Goals of care: CODE STATUS DNR. 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  Palliative care consulted on 4/20.  Disposition: Per family, patient lives alone before prior hospitalization.  Family not interested in SNF or LTACH.  Also do not feel he is ready to be discharged until his wounds  are down to stage II.   Severe malnutrition Body mass index is 19.06 kg/m. Nutrition Problem: Severe Malnutrition Etiology: chronic  illness Signs/Symptoms: energy intake < or equal to 75% for > or equal to 1 month, severe muscle depletion Interventions: Ensure Enlive (each supplement provides 350kcal and 20 grams of protein), Magic cup, Hormel Shake, MVI, Juven, Other (Comment) (vitamin C )  Multiple pressure skin injuries: Pressure Injury 02/26/24 Coccyx Mid Stage 4 - Full thickness tissue loss with exposed bone, tendon or muscle. (Active)  02/26/24 0916  Location: Coccyx  Location Orientation: Mid  Staging: Stage 4 - Full thickness tissue loss with exposed bone, tendon or muscle.  Wound Description (Comments):   DO NOT USE:  Present on Admission: Yes  Dressing Type -- (Aquacell, mepilex) 06/28/24 0930     Pressure Injury 02/26/24 Toe (Comment  which one) Anterior;Right Deep Tissue Pressure Injury - Purple or maroon localized area of discolored intact skin or blood-filled blister due to damage of underlying soft tissue from pressure and/or shear. right gre (Active)  02/26/24 1215  Location: Toe (Comment  which one)  Location Orientation: Anterior;Right  Staging: Deep Tissue Pressure Injury - Purple or maroon localized area of discolored intact skin or blood-filled blister due to damage of underlying soft tissue from pressure and/or shear.  Wound Description (Comments): right great toe  DO NOT USE:  Present on Admission:   Dressing Type None 06/29/24 0615     Pressure Injury 03/06/24 Hip Anterior;Left;Proximal Unstageable - Full thickness tissue loss in which the base of the injury is covered by slough (yellow, tan, gray, green or brown) and/or eschar (tan, brown or black) in the wound bed. unstageable (Active)  03/06/24 1612  Location: Hip  Location Orientation: Anterior;Left;Proximal  Staging: Unstageable - Full thickness tissue loss in which the base of the injury is covered by slough (yellow, tan, gray, green or brown) and/or eschar (tan, brown or black) in the wound bed.  Wound Description (Comments): unstageable   DO NOT USE:  Present on Admission: No  Dressing Type Foam - Lift dressing to assess site every shift 07/01/24 2200     Pressure Injury 03/06/24 Shoulder Left Unstageable - Full thickness tissue loss in which the base of the injury is covered by slough (yellow, tan, gray, green or brown) and/or eschar (tan, brown or black) in the wound bed. unstageable (Active)  03/06/24 1613  Location: Shoulder  Location Orientation: Left  Staging: Unstageable - Full thickness tissue loss in which the base of the injury is covered by slough (yellow, tan, gray, green or brown) and/or eschar (tan, brown or black) in the wound bed.  Wound Description (Comments): unstageable  DO NOT USE:  Present on Admission: No  Dressing Type Foam - Lift dressing to assess site every shift 07/01/24 2200     Pressure Injury 04/23/24 Heel Right Unstageable - Full thickness tissue loss in which the base of the injury is covered by slough (yellow, tan, gray, green or brown) and/or eschar (tan, brown or black) in the wound bed. DTPI evolving into unstageable (Active)  04/23/24   Location: Heel  Location Orientation: Right  Staging: Unstageable - Full thickness tissue loss in which the base of the injury is covered by slough (yellow, tan, gray, green or brown) and/or eschar (tan, brown or black) in the wound bed.  Wound Description (Comments): DTPI evolving into unstageable  DO NOT USE:  Present on Admission: No     Pressure Injury 04/23/24 Heel Left Deep Tissue Pressure  Injury - Purple or maroon localized area of discolored intact skin or blood-filled blister due to damage of underlying soft tissue from pressure and/or shear. (Active)  04/23/24   Location: Heel  Location Orientation: Left  Staging: Deep Tissue Pressure Injury - Purple or maroon localized area of discolored intact skin or blood-filled blister due to damage of underlying soft tissue from pressure and/or shear.  Wound Description (Comments):   DO NOT USE:  Present  on Admission: No   DVT prophylaxis:  SCDs Start: 03/04/24 2151 Place TED hose Start: 03/04/24 2151 apixaban  (ELIQUIS ) tablet 5 mg  Code Status: DNR Family Communication: None at bedside.  Level of care: Telemetry Medical Status is: Inpatient Remains inpatient appropriate because: Complicated disposition, abdominal pain, possible acute cholecystitis and failure to thrive   Final disposition: Yet to be determined   55 minutes with more than 50% spent in reviewing records, counseling patient/family and coordinating care.   Sch Meds:  Scheduled Meds:  (feeding supplement) PROSource Plus  30 mL Oral BID BM   acetaminophen   650 mg Oral Q8H   amiodarone   200 mg Oral Daily   apixaban   5 mg Oral BID   atorvastatin   40 mg Oral Daily   calcium  carbonate  1 tablet Oral TID   collagenase    Topical Daily   diclofenac  Sodium  4 g Topical QID   feeding supplement  237 mL Oral BID BM   ferrous sulfate   325 mg Oral QHS   furosemide   20 mg Oral Daily   guaiFENesin   600 mg Oral BID   insulin  aspart  0-9 Units Subcutaneous TID WC   insulin  glargine-yfgn  8 Units Subcutaneous Daily   levothyroxine   50 mcg Oral Q0600   lidocaine   1 patch Transdermal Q24H   lidocaine -EPINEPHrine  (PF)  10 mL Infiltration Once   liver oil-zinc  oxide   Topical BID   multivitamin with minerals  1 tablet Oral Daily   nutrition supplement (JUVEN)  1 packet Oral BID BM   mouth rinse  15 mL Mouth Rinse 4 times per day   pantoprazole   40 mg Oral Daily   polyethylene glycol  17 g Oral BID   potassium chloride   20 mEq Oral Daily   senna-docusate  1 tablet Oral QHS   sertraline   50 mg Oral Daily   sodium chloride  flush  10-40 mL Intracatheter Q12H   Tafamidis   1 capsule Oral Daily   vitamin A   10,000 Units Oral Daily   vutrisiran  sodium  25 mg Subcutaneous Q90 days   Continuous Infusions:  dextrose      PRN Meds:.acetaminophen , bismuth  subsalicylate, butalbital -acetaminophen -caffeine , dextrose , food thickener,  hydrOXYzine , lip balm, meclizine , ondansetron  **OR** ondansetron  (ZOFRAN ) IV, mouth rinse, oxyCODONE , artificial tears  Antimicrobials: Anti-infectives (From admission, onward)    Start     Dose/Rate Route Frequency Ordered Stop   04/27/24 1615  cefTAZidime  (FORTAZ ) 2 g in sodium chloride  0.9 % 100 mL IVPB        2 g 200 mL/hr over 30 Minutes Intravenous Every 8 hours 04/27/24 1518 05/03/24 2359   03/16/24 1015  cefTRIAXone  (ROCEPHIN ) 2 g in sodium chloride  0.9 % 100 mL IVPB        2 g 200 mL/hr over 30 Minutes Intravenous Every 24 hours 03/16/24 0929 03/20/24 1219   03/16/24 1015  doxycycline  (VIBRA -TABS) tablet 100 mg        100 mg Oral Every 12 hours 03/16/24 0929 03/20/24 2131   03/14/24 1000  metroNIDAZOLE  (FLAGYL ) tablet  500 mg        500 mg Oral Every 12 hours 03/14/24 0803 03/19/24 2219   03/13/24 1000  cefadroxil  (DURICEF) capsule 1,000 mg  Status:  Discontinued        1,000 mg Oral 2 times daily 03/12/24 1632 03/16/24 0929   03/07/24 2200  metroNIDAZOLE  (FLAGYL ) tablet 500 mg        500 mg Oral Every 12 hours 03/07/24 0840 03/11/24 2258   03/05/24 1930  vancomycin  (VANCOREADY) IVPB 1250 mg/250 mL  Status:  Discontinued        1,250 mg 166.7 mL/hr over 90 Minutes Intravenous Every 24 hours 03/04/24 2205 03/04/24 2224   03/05/24 1930  Vancomycin  (VANCOCIN ) 1,250 mg in sodium chloride  0.9 % 250 mL IVPB  Status:  Discontinued        1,250 mg 166.7 mL/hr over 90 Minutes Intravenous Every 24 hours 03/04/24 2224 03/05/24 0717   03/05/24 1930  vancomycin  (VANCOREADY) IVPB 1500 mg/300 mL  Status:  Discontinued        1,500 mg 150 mL/hr over 120 Minutes Intravenous Every 24 hours 03/05/24 0717 03/12/24 1636   03/05/24 1400  ceFEPIme  (MAXIPIME ) 2 g in sodium chloride  0.9 % 100 mL IVPB  Status:  Discontinued        2 g 200 mL/hr over 30 Minutes Intravenous Every 8 hours 03/05/24 0717 03/12/24 1632   03/05/24 0600  ceFEPIme  (MAXIPIME ) 2 g in sodium chloride  0.9 % 100 mL IVPB  Status:   Discontinued        2 g 200 mL/hr over 30 Minutes Intravenous Every 12 hours 03/04/24 2205 03/05/24 0717   03/05/24 0000  metroNIDAZOLE  (FLAGYL ) IVPB 500 mg  Status:  Discontinued        500 mg 100 mL/hr over 60 Minutes Intravenous Every 12 hours 03/04/24 2206 03/04/24 2209   03/04/24 1730  aztreonam (AZACTAM) 2 g in sodium chloride  0.9 % 100 mL IVPB  Status:  Discontinued        2 g 200 mL/hr over 30 Minutes Intravenous  Once 03/04/24 1715 03/04/24 1724   03/04/24 1730  metroNIDAZOLE  (FLAGYL ) IVPB 500 mg  Status:  Discontinued        500 mg 100 mL/hr over 60 Minutes Intravenous Every 12 hours 03/04/24 1715 03/07/24 0840   03/04/24 1730  vancomycin  (VANCOREADY) IVPB 1500 mg/300 mL        1,500 mg 150 mL/hr over 120 Minutes Intravenous  Once 03/04/24 1715 03/04/24 2153   03/04/24 1730  ceFEPIme  (MAXIPIME ) 2 g in sodium chloride  0.9 % 100 mL IVPB        2 g 200 mL/hr over 30 Minutes Intravenous  Once 03/04/24 1724 03/04/24 1826        I have personally reviewed the following labs and images: CBC: Recent Labs  Lab 07/01/24 0414  WBC 10.0  HGB 11.4*  HCT 33.6*  MCV 85.7  PLT 224    BMP &GFR Recent Labs  Lab 07/01/24 0414 07/02/24 0338  NA 137 140  K 4.9 4.9  CL 104 105  CO2 24 25  GLUCOSE 88 93  BUN 47* 45*  CREATININE 1.22 1.25*  CALCIUM  9.0 9.0  MG 1.9 2.1  PHOS 3.1  --     Estimated Creatinine Clearance: 39.3 mL/min (A) (by C-G formula based on SCr of 1.25 mg/dL (H)). Liver & Pancreas: Recent Labs  Lab 07/01/24 0414 07/02/24 0338  AST 67* 90*  ALT 78* 85*  ALKPHOS 184* 197*  BILITOT 1.5* 1.6*  PROT 5.9* 5.6*  ALBUMIN  2.8* 2.7*   No results for input(s): LIPASE, AMYLASE in the last 168 hours. No results for input(s): AMMONIA in the last 168 hours. Diabetic: No results for input(s): HGBA1C in the last 72 hours. Recent Labs  Lab 06/30/24 2003 07/01/24 1232 07/01/24 1628 07/01/24 2004 07/02/24 0740  GLUCAP 146* 130* 84 151* 62*    Cardiac Enzymes: No results for input(s): CKTOTAL, CKMB, CKMBINDEX, TROPONINI in the last 168 hours. No results for input(s): PROBNP in the last 8760 hours. Coagulation Profile: No results for input(s): INR, PROTIME in the last 168 hours. Thyroid  Function Tests: No results for input(s): TSH, T4TOTAL, FREET4, T3FREE, THYROIDAB in the last 72 hours. Lipid Profile: No results for input(s): CHOL, HDL, LDLCALC, TRIG, CHOLHDL, LDLDIRECT in the last 72 hours. Anemia Panel: No results for input(s): VITAMINB12, FOLATE, FERRITIN, TIBC, IRON, RETICCTPCT in the last 72 hours. Urine analysis:    Component Value Date/Time   COLORURINE STRAW (A) 03/04/2024 2139   APPEARANCEUR CLEAR 03/04/2024 2139   LABSPEC 1.016 03/04/2024 2139   PHURINE 6.0 03/04/2024 2139   GLUCOSEU >=500 (A) 03/04/2024 2139   HGBUR SMALL (A) 03/04/2024 2139   BILIRUBINUR NEGATIVE 03/04/2024 2139   KETONESUR NEGATIVE 03/04/2024 2139   PROTEINUR 30 (A) 03/04/2024 2139   UROBILINOGEN 0.2 05/05/2015 1443   NITRITE NEGATIVE 03/04/2024 2139   LEUKOCYTESUR NEGATIVE 03/04/2024 2139   Sepsis Labs: Invalid input(s): PROCALCITONIN, LACTICIDVEN  Microbiology: No results found for this or any previous visit (from the past 240 hours).  Radiology Studies: DG Chest Port 1 View Result Date: 07/02/2024 EXAM: 1 VIEW XRAY OF THE CHEST 07/02/2024 06:03:50 AM COMPARISON: 06/19/2024 CLINICAL HISTORY: 141880 SOB (shortness of breath) [id number]. Cough,sob,hx pleural effusion; rover FINDINGS: LUNGS AND PLEURA: Bilateral pleural effusions are again noted, right greater than left. The right pleural effusion appears increased in volume with worsening aeration to the right lower lung. Similar opacity within the perihilar left mid lung. Retrocardiac opacification is unchanged and may reflect underlying atelectasis or consolidative change. HEART AND MEDIASTINUM: Stable cardiac enlargement. BONES AND SOFT  TISSUES: No acute osseous abnormality. IMPRESSION: 1. Bilateral pleural effusions, right greater than left. The right effusion is increased in volume with associated worsening aeration to the right lower lung. 2. Persistent retrocardiac opacification in the left lower lung which may reflect underlying airspace disease or atelectasis. 3. Stable cardiac enlargement. Electronically signed by: Waddell Calk MD 07/02/2024 07:55 AM EDT RP Workstation: [id number]   US  Abdomen Limited RUQ (LIVER/GB) Result Date: 07/01/2024 CLINICAL DATA:  Elevated liver enzymes. EXAM: ULTRASOUND ABDOMEN LIMITED RIGHT UPPER QUADRANT COMPARISON:  CT abdomen and pelvis 06/02/2024 FINDINGS: Gallbladder: No gallstones are seen. There is gallbladder wall thickening measuring at up to 8.7 mm with pericholecystic fluid. Positive sonographic Murphy sign noted by sonographer. Common bile duct: Diameter: 5.7 mm Liver: No focal lesion identified. Within normal limits in parenchymal echogenicity. Portal vein is patent on color Doppler imaging with normal direction of blood flow towards the liver. Other: There is a small amount of free fluid in the right upper quadrant. Right pleural effusion is present. IMPRESSION: 1. Gallbladder wall thickening with pericholecystic fluid and positive sonographic Murphy sign. No gallstones are seen. Findings are equivocal for acute cholecystitis. 2. Small amount of free fluid in the right upper quadrant. 3. Right pleural effusion. Electronically Signed   By: Greig Pique M.D.   On: 07/01/2024 20:33      Haidynn Almendarez T. Nik Gorrell [name]  If 7PM-7AM,  please contact night-coverage [email or web address] 07/02/2024, 11:30 AM

## 2024-07-03 DIAGNOSIS — Z8679 Personal history of other diseases of the circulatory system: Secondary | ICD-10-CM | POA: Diagnosis not present

## 2024-07-03 DIAGNOSIS — L89154 Pressure ulcer of sacral region, stage 4: Secondary | ICD-10-CM | POA: Diagnosis not present

## 2024-07-03 DIAGNOSIS — I483 Typical atrial flutter: Secondary | ICD-10-CM | POA: Diagnosis not present

## 2024-07-03 DIAGNOSIS — A419 Sepsis, unspecified organism: Secondary | ICD-10-CM | POA: Diagnosis not present

## 2024-07-03 LAB — CBC
HCT: 35.8 % — ABNORMAL LOW (ref 39.0–52.0)
Hemoglobin: 12.2 g/dL — ABNORMAL LOW (ref 13.0–17.0)
MCH: 29.3 pg (ref 26.0–34.0)
MCHC: 34.1 g/dL (ref 30.0–36.0)
MCV: 86.1 fL (ref 80.0–100.0)
Platelets: 231 K/uL (ref 150–400)
RBC: 4.16 MIL/uL — ABNORMAL LOW (ref 4.22–5.81)
RDW: 19.1 % — ABNORMAL HIGH (ref 11.5–15.5)
WBC: 8.6 K/uL (ref 4.0–10.5)
nRBC: 0 % (ref 0.0–0.2)

## 2024-07-03 LAB — COMPREHENSIVE METABOLIC PANEL WITH GFR
ALT: 81 U/L — ABNORMAL HIGH (ref 0–44)
AST: 76 U/L — ABNORMAL HIGH (ref 15–41)
Albumin: 2.7 g/dL — ABNORMAL LOW (ref 3.5–5.0)
Alkaline Phosphatase: 204 U/L — ABNORMAL HIGH (ref 38–126)
Anion gap: 11 (ref 5–15)
BUN: 46 mg/dL — ABNORMAL HIGH (ref 8–23)
CO2: 22 mmol/L (ref 22–32)
Calcium: 8.7 mg/dL — ABNORMAL LOW (ref 8.9–10.3)
Chloride: 106 mmol/L (ref 98–111)
Creatinine, Ser: 1.15 mg/dL (ref 0.61–1.24)
GFR, Estimated: >60 mL/min (ref >60)
Glucose, Bld: 157 mg/dL — ABNORMAL HIGH (ref 70–99)
Potassium: 5.5 mmol/L — ABNORMAL HIGH (ref 3.5–5.1)
Sodium: 139 mmol/L (ref 135–145)
Total Bilirubin: 1.7 mg/dL — ABNORMAL HIGH (ref 0.0–1.2)
Total Protein: 5.9 g/dL — ABNORMAL LOW (ref 6.5–8.1)

## 2024-07-03 LAB — MAGNESIUM: Magnesium: 2.1 mg/dL (ref 1.7–2.4)

## 2024-07-03 LAB — GLUCOSE, CAPILLARY
Glucose-Capillary: 142 mg/dL — ABNORMAL HIGH (ref 70–99)
Glucose-Capillary: 127 mg/dL — ABNORMAL HIGH (ref 70–99)
Glucose-Capillary: 133 mg/dL — ABNORMAL HIGH (ref 70–99)
Glucose-Capillary: 136 mg/dL — ABNORMAL HIGH (ref 70–99)
Glucose-Capillary: 135 mg/dL — ABNORMAL HIGH (ref 70–99)

## 2024-07-03 LAB — POTASSIUM: Potassium: 5.2 mmol/L — ABNORMAL HIGH (ref 3.5–5.1)

## 2024-07-03 MED ORDER — FUROSEMIDE 40 MG PO TABS
40.0000 mg | ORAL_TABLET | Freq: Every day | ORAL | Status: DC
  Administered 2024-07-03 – 2024-07-04 (×3): 40 mg via ORAL
  Filled 2024-07-03 (×2): qty 1

## 2024-07-03 MED ORDER — SODIUM ZIRCONIUM CYCLOSILICATE 10 G PO PACK
10.0000 g | PACK | Freq: Once | ORAL | Status: AC
  Administered 2024-07-03 (×2): 10 g via ORAL
  Filled 2024-07-03: qty 1

## 2024-07-03 NOTE — Plan of Care (Signed)
  Problem: Fluid Volume: Goal: Hemodynamic stability will improve Outcome: Progressing   Problem: Clinical Measurements: Goal: Diagnostic test results will improve Outcome: Progressing Goal: Signs and symptoms of infection will decrease Outcome: Progressing   Problem: Respiratory: Goal: Will maintain a patent airway Outcome: Progressing

## 2024-07-03 NOTE — Plan of Care (Signed)
  Problem: Fluid Volume: Goal: Hemodynamic stability will improve Outcome: Progressing   Problem: Clinical Measurements: Goal: Diagnostic test results will improve Outcome: Progressing Goal: Signs and symptoms of infection will decrease Outcome: Progressing   Problem: Education: Goal: Knowledge of risk factors and measures for prevention of condition will improve Outcome: Progressing   Problem: Coping: Goal: Psychosocial and spiritual needs will be supported Outcome: Progressing   Problem: Respiratory: Goal: Will maintain a patent airway Outcome: Progressing Goal: Complications related to the disease process, condition or treatment will be avoided or minimized Outcome: Progressing   Problem: Education: Goal: Ability to describe self-care measures that may prevent or decrease complications (Diabetes Survival Skills Education) will improve Outcome: Progressing   Problem: Coping: Goal: Ability to adjust to condition or change in health will improve Outcome: Progressing   Problem: Fluid Volume: Goal: Ability to maintain a balanced intake and output will improve Outcome: Progressing   Problem: Health Behavior/Discharge Planning: Goal: Ability to manage health-related needs will improve Outcome: Progressing   Problem: Metabolic: Goal: Ability to maintain appropriate glucose levels will improve Outcome: Progressing   Problem: Nutritional: Goal: Maintenance of adequate nutrition will improve Outcome: Progressing Goal: Progress toward achieving an optimal weight will improve Outcome: Progressing   Problem: Skin Integrity: Goal: Risk for impaired skin integrity will decrease Outcome: Progressing   Problem: Tissue Perfusion: Goal: Adequacy of tissue perfusion will improve Outcome: Progressing   Problem: Education: Goal: Knowledge of General Education information will improve Description: Including pain rating scale, medication(s)/side effects and non-pharmacologic  comfort measures Outcome: Progressing   Problem: Health Behavior/Discharge Planning: Goal: Ability to manage health-related needs will improve Outcome: Progressing   Problem: Clinical Measurements: Goal: Ability to maintain clinical measurements within normal limits will improve Outcome: Progressing Goal: Will remain free from infection Outcome: Progressing Goal: Diagnostic test results will improve Outcome: Progressing Goal: Respiratory complications will improve Outcome: Progressing Goal: Cardiovascular complication will be avoided Outcome: Progressing   Problem: Activity: Goal: Risk for activity intolerance will decrease Outcome: Progressing   Problem: Nutrition: Goal: Adequate nutrition will be maintained Outcome: Progressing   Problem: Coping: Goal: Level of anxiety will decrease Outcome: Progressing   Problem: Elimination: Goal: Will not experience complications related to bowel motility Outcome: Progressing Goal: Will not experience complications related to urinary retention Outcome: Progressing   Problem: Pain Managment: Goal: General experience of comfort will improve and/or be controlled Outcome: Progressing   Problem: Safety: Goal: Ability to remain free from injury will improve Outcome: Progressing   Problem: Skin Integrity: Goal: Risk for impaired skin integrity will decrease Outcome: Progressing

## 2024-07-03 NOTE — Progress Notes (Signed)
 Occupational Therapy Treatment  and DC Summary  Patient Details Name: Earl [name] MRN: [id number] DOB: [birth year] Today's Date: 07/03/2024   History of present illness 83 y.o. male admitted from [facility] rehab on 03/04/24 with SOB, sacral pain, Afib with RVR. Pt with acute metabolic encephalopathy, rhabdomyolysis and sepsis secondary to sacral wound infection. 4/15 & 4/17 bedside sacral wound debridement. Pt also with B pleural effusion, 6/18 L thoracentesis, 7/14 L & R thoracentesis.  PMhx: adm 3/26-4/08/2024 with fall, AMS, COVID; d/c to SNF rehab. CVA (2021, residual R-side weakness), T2DM, HTN, CHF, MGUS, Afib on Eliquis .   OT comments  OT followed-up with pt and his daughter to review level one theraband exercises for pt to perform at bed level. Placed red therabands around pt bed railings for support, provided built up handles for his limited grasp. Educated pt and his daughter on proper body positioning and muscle engagement for listed exercises, pt demonstrated capacity to complete them at current level. Discussed with pt daughter an exercises schedule, slowly integrating it in twice a day to see how he progresses. Pt has been able to meet 1/6 acute OT goals, so far he can self feed with AE and setup. Medical complications also continue to limit his progress, OT signing off at this time but would love a reconsult when pt is able and ready. DC plans update to LTAC given pt's med complexity.       If plan is discharge home, recommend the following:  A lot of help with walking and/or transfers;A lot of help with bathing/dressing/bathroom;Assistance with cooking/housework;Assist for transportation;Help with stairs or ramp for entrance;Two people to help with walking and/or transfers;Assistance with feeding   Equipment Recommendations  Other (comment) (defer to next level of care)    Recommendations for Other Services      Precautions / Restrictions Precautions Precautions:  Fall Recall of Precautions/Restrictions: Impaired Precaution/Restrictions Comments: sacral wound; L hip wound , bilat heel wounds; L shoulder wound Restrictions Weight Bearing Restrictions Per Provider Order: No       Mobility Bed Mobility Overal bed mobility: Needs Assistance Bed Mobility: Rolling Rolling: Mod assist, Used rails         General bed mobility comments: pt with BM, mod A to roll using bed rail and cues for pt to sequence    Transfers                         Balance                                           ADL either performed or assessed with clinical judgement   ADL                                         General ADL Comments: Educated pt and daughter on level one theraband exercises while bed level    Extremity/Trunk Assessment              Vision       Perception     Praxis     Communication Communication Communication: Impaired Factors Affecting Communication: Reduced clarity of speech;Difficulty expressing self (soft spoken)   Cognition Arousal: Alert Behavior During Therapy: WFL for tasks assessed/performed Cognition: Difficult to assess Difficult  to assess due to: Impaired communication                             Following commands: Impaired Following commands impaired: Follows one step commands with increased time      Cueing   Cueing Techniques: Verbal cues, Tactile cues, Visual cues  Exercises General Exercises - Upper Extremity Shoulder Flexion: AROM, Theraband, Strengthening, Left Theraband Level (Shoulder Flexion): Level 2 (Red) Shoulder Extension: AROM, Right, Theraband, Supine Theraband Level (Shoulder Extension): Level 2 (Red) Shoulder ABduction: AROM, Left, Supine, Theraband Theraband Level (Shoulder Abduction): Level 2 (Red) Shoulder ADduction: AROM, Left, Supine, Theraband Theraband Level (Shoulder Adduction): Level 2 (Red) Shoulder Horizontal  ABduction: Theraband, Left, Strengthening, Supine Theraband Level (Shoulder Horizontal Abduction): Level 2 (Red) Elbow Flexion: AROM, Left, Theraband, Supine Theraband Level (Elbow Flexion): Level 2 (Red) Elbow Extension: Strengthening, Theraband, AROM, Right, Supine Theraband Level (Elbow Extension): Level 2 (Red) General Exercises - Lower Extremity Straight Leg Raises: AAROM, Strengthening, Both, 5 reps, Supine Other Exercises Other Exercises: Level one theraband exercises with red bands and built up handles.    Shoulder Instructions       General Comments Pt had BM during session, pt cleaned and reposistioned appropriately. LPN in room doing dressing change and NT assisting with pericare    Pertinent Vitals/ Pain       Pain Assessment Pain Assessment: Faces Faces Pain Scale: No hurt Pain Location: generalized with repositioning. Pain Descriptors / Indicators: Discomfort, Grimacing Pain Intervention(s): Monitored during session, Repositioned, Limited activity within patient's tolerance  Home Living                                          Prior Functioning/Environment              Frequency  Other (comment) (DC)        Progress Toward Goals  OT Goals(current goals can now be found in the care plan section)  Progress towards OT goals: Goals met/education completed, patient discharged from OT (pt not progressing, DC from OT services at this time. All education addressed)  Acute Rehab OT Goals Patient Stated Goal: none stated OT Goal Formulation: All assessment and education complete, DC therapy (Reconsult when able) Time For Goal Achievement: 07/03/24 Potential to Achieve Goals: Fair  Plan      Co-evaluation                 AM-PAC OT 6 Clicks Daily Activity     Outcome Measure   Help from another person eating meals?: A Little Help from another person taking care of personal grooming?: A Little Help from another person  toileting, which includes using toliet, bedpan, or urinal?: Total Help from another person bathing (including washing, rinsing, drying)?: A Lot Help from another person to put on and taking off regular upper body clothing?: A Lot Help from another person to put on and taking off regular lower body clothing?: Total 6 Click Score: 12    End of Session    OT Visit Diagnosis: Other abnormalities of gait and mobility (R26.89);Muscle weakness (generalized) (M62.81);History of falling (Z91.81);Feeding difficulties (R63.3);Ataxia, unspecified (R27.0) Pain - Right/Left: Left Pain - part of body:  (sacral)   Activity Tolerance Patient tolerated treatment well   Patient Left in bed;with call bell/phone within reach;with family/visitor present   Nurse Communication Mobility status;Other (  comment) (encourage self feeding)        Time: 1820-1850 OT Time Calculation (min): 30 min  Charges: OT General Charges $OT Visit: 1 Visit OT Treatments $Therapeutic Activity: 8-22 mins $Therapeutic Exercise: 8-22 mins  07/03/2024  AB, OTR/L  Acute Rehabilitation Services  Office: [phone]   [name] 07/03/2024, 7:00 PM

## 2024-07-03 NOTE — Progress Notes (Signed)
   07/02/24 2130  Provider Notification  Provider Name/Title Franky  Date Provider Notified 07/02/24  Time Provider Notified 2300  Method of Notification  (secure chat)  Notification Reason Other (Comment) (Inquiring if pt will be NPO after midnight for HIDA scan tomorrow)  Provider response See new orders  Date of Provider Response 07/02/24  Time of Provider Response 2147

## 2024-07-03 NOTE — Progress Notes (Signed)
 Physical Therapy Discharge Patient Details Name: Earl [name] MRN: [id number] DOB: 06-Nov-1941 Today's Date: 07/03/2024   Patient discharged from PT services secondary to patient has made no progress toward goals in a reasonable time frame.  Please see latest therapy progress note for current level of functioning and progress toward goals.    Progress and discharge plan discussed with patient and/or caregiver: Patient/Caregiver agrees with plan  Spoke with PT/OT team, patient and daughter regarding [name]'s progress over the past month. Upon chart review seems he is at the same level and not able to make much progress.  He has new medical issues as well per daughter that are being worked up.  Discussed continuing with mobility team assisting with sit to stand and LE strength activities.  Also that if medical issues improve and MD feels pt can make further progress with resuming PT they can re-order.  Patient, daughter and team in agreement for discontinuing PT services at this time.  LE HEP issued to pt/daughter.  Will follow up with mobility team regarding plan for Tuesday/Thursday schedule for assisting with mobility as tolerated.  PT will sign off.   Micheline Portal, PT Acute Rehabilitation Services Office:[phone] 07/03/2024

## 2024-07-03 NOTE — Progress Notes (Signed)
 PROGRESS NOTE  Earl [name] FMW:[id number] DOB: May 26, 1941   PCP: [name], MD  Patient is from: SNF  DOA: 03/04/2024 LOS: 121  Chief complaints No chief complaint on file.    Brief Narrative / Interim history: 83 year old M with PMH of PAF on Eliquis , HFmrEF, amyloid cardiomyopathy, NIDDM-2, CVA with right-sided weakness, stage IV sacral wound, pancytopenia, MGUS, HTN, HLD and hypothyroidism brought to ED by EMS from [facility] rehab due to pain in sacral area and shortness of breath 4 days after he was discharged from the hospital.   Patient was hospitalized from 3/26-4/10 with generalized weakness, encephalopathy and COVID-19 infection.  Per daughter, patient sustained skin injury during this hospitalization, and he has had significant pain, discomfort and progressive foul-smelling discharge from sacral wound over the course of 4 to 5 days before coming back to ED.  No report of constitutional symptoms, chest pain or dyspnea.  In ED, hypotensive to 82/51.  In RVR to 140s.  Na 130.  Glucose elevated to 500.  Not in DKA or HHS.  WBC 13.6 with left shift.  CXR without acute finding.  CT abdomen and pelvis ordered.  Cultures obtained.  Admitted with working diagnosis of sepsis due to sacral pressure ulcer wound infection, A-fib with RVR, hypotension.  Cultures obtained.  Patient was started on broad-spectrum antibiotics and IV fluid.  PCCM, cardiology and general surgery consulted.  See plan and assessment for more on hospital course.  Subjective: Seen and examined earlier this morning.  No major events overnight or this morning.  No complaints this morning.  Patient received Tc99m Choletec for HIDA scan yesterday but was not NPO.  HIDA scan delayed until tomorrow to allow radiopharmaceutical runs out of his system.  Patient denies pain, nausea, vomiting or shortness of breath.  Daughter concerned about newly satiety, poor appetite, left hip wound and increased oral  secretion.  Objective: Vitals:   07/03/24 0442 07/03/24 0443 07/03/24 0831 07/03/24 1252  BP:  108/67 (!) 100/58 108/66  Pulse:  70 71 74  Resp:  18  16  Temp:  97.8 F (36.6 C) 97.8 F (36.6 C) 97.6 F (36.4 C)  TempSrc:      SpO2:  100% 100% 96%  Weight: 64 kg     Height:        Examination:  GENERAL: No apparent distress.  Nontoxic. HEENT: MMM.  Vision and hearing grossly intact.  NECK: Supple.  No apparent JVD.  RESP: On room air.  Some WOB with talking. CVS:  RRR. Heart sounds normal.  ABD/GI/GU: BS+. Abd soft.  Nontender. MSK/EXT:  Moves extremities.  Ace wrap over BLE.  Dependent edema SKIN: Sacral, left hip, left shoulder and right heel ulcers.  See pictures under media. NEURO: AA.  Oriented appropriately.  No apparent focal neurodeficit.  BLE weakness. PSYCH: Calm. Normal affect.   Consultants PCCM General Surgery Cardiology Infectious disease Plastic surgery Orthopedic surgery Palliative medicine  Microbiology 4/14-COVID-19 PCR positive.  Flu and RSV PCR nonreactive. 4/14-blood cultures NGTD 6/4-sacral wound culture with Pseudomonas aeruginosa and Klebsiella pneumoniae 7/14-pleural fluid culture NGTD   Assessment and plan: Severe sepsis due to sacral pressure wound infection: Present on arrival.  Presents with generalized weakness, poor appetite, hypotension pain and drainage from sacral wound.  Had leukocytosis, tachycardia, hypotension and lactic acidosis on presentation.    -Culture data as above. -4/14-CT showed sacral wound extension to the right gluteus muscle without signs of osteomyelitis. -4/15-underwent bedside debridement of sacral decubitus wound by general surgery -4/21-ID  consulted and recommended continuing broad-spectrum antibiotics that he completed on 6/13. 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 -6/17- ID recommended continuing holding off antibiotics since he is  HDS, afebrile without leukocytosis. -6/17-evaluated by plastic surgery.  Sacral wound felt to be clean.  Wound care recommended -7/13-7/13-CT showed similar appearance of osteomyelitis underlying sacral decubitus ulcer.  -7/13-plastic surgery recommended for continued wound care and no surgical management. -7/14-Ortho evaluated patient for left hip wound.  Debulked and recommended optimizing nutrition -8/5- Surgery recommended hydrotherapy for left hip wound. Surgical debridement if not successful -8/5-wound care and instruction updated  -Optimize nutrition-see dietitian instruction below. -Overall, difficult case due to patient's nutritional status and limited mobility.  Prealbumin <5. -Tylenol  and oxycodone  as needed.  Holding oxycodone  today for HIDA scan tomorrow    Chronic HFrEF/cardiac amyloidosis/NICM/hypotension: Hypotension resolved.  Recurrent pleural effusion and anasarca partly due to nutritional status and hypoalbuminemia.  Not on diuretics at home.  Was on p.o. Lasix  20 mg daily.  BNP elevated to 1900 (no prior value for comparison.  LFTs slightly up but improving.  Congestive hepatopathy?  Has poor appetite and early satiety with intermittent nausea and abdominal pain. -Increase Lasix  to 40 mg daily.  Will alternate with IV Lasix  -Continue Tafamidis  and vutrisiran . -Ace wrap for BLE edema -Strict intake and output,   Recurrent bilateral pleural effusion/anasarca: Likely due to CHF, poor nutritional status/hypoalbuminemia.  Has some work of breathing as he speaks but saturating at 100% on RA.  Denies chest pain or SOB. -6/18-left-sided thoracentesis by IR.  Body fluid culture without any growth.   -7/14-bilateral thoracentesis by pulmonology.  Fluid transudative and culture negative -7/30-right thoracocentesis with removal of 500 mL of pleural fluid by IR.   -8/12-right thoracocentesis with removal of 800 cc fluid. -Diuretics as above. -Remains on room air  Possible acalculous  cholecystitis:  RUQ US  raise concern for cholecystitis without cholelithiasis.  Patient had right-sided abdominal pain that seems of resolved.  No leukocytosis or fever to suggest active infection.  Symptoms and US  finding could be due to anasarca/possible congestive hepatopathy.  LFTs slightly up. -Surgery recommended HIDA scan-which has been postponed to tomorrow, 8/14 due to the above reason - N.p.o. after midnight.  Oxycodone  discontinued.  Elevated liver enzymes: Chronic but slightly trended up. Acute hepatitis panel was negative.  RUQ US  as above.  He has CHF with markedly elevated BNP raising concern for congestive hepatopathy.  LFT improved some -Changed scheduled Tylenol  to as needed -Continue holding Lipitor. -May have to find alternative for amiodarone  if LFT continues to trend up -Continue monitoring   IDDM-2 with hyperglycemia : A1c 8.4% on 4/3. Recent Labs  Lab 07/02/24 1606 07/02/24 2018 07/03/24 0445 07/03/24 0825 07/03/24 1251  GLUCAP 172* 205* 142* 127* 133*  -Continue SSI-sensitive -Continue Semglee  5 units daily  PAF/flutter with RVR: Rate controlled. -Continue amiodarone  and Eliquis  Eliquis  -May have to find alternative for amiodarone  if LFT continues to trend up -Cardiology signed off.   History of CVA: Stable -On Eliquis .  Holding Lipitor due to LFT.   GERD: -Continue PPI   Anemia of chronic disease: Stable -Monitor intermittently   Hypothyroidism:  -Continue levothyroxine   Anxiety and adjustment disorder: -Psychiatry consulted on 7/7 and recommended continuing Zoloft  and Atarax .   Constipation: - Bowel regimen.  Hyperkalemia: Likely due to potassium supplementation -Discontinue p.o. KCl -Lokelma  10 g x 1 -Recheck in the morning    Goals of care: CODE STATUS DNR. 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  Palliative care consulted on  4/20.  Disposition: Per family, patient lives alone before prior hospitalization.  Family not interested in SNF or LTACH.  Also do not feel he is ready to be discharged until his wounds are down to stage II.   Severe malnutrition Body mass index is 19.68 kg/m. Nutrition Problem: Severe Malnutrition Etiology: chronic illness Signs/Symptoms: energy intake < or equal to 75% for > or equal to 1 month, severe muscle depletion Interventions: Ensure Enlive (each supplement provides 350kcal and 20 grams of protein), Magic cup, Hormel Shake, MVI, Juven, Other (Comment) (vitamin C )  Multiple pressure skin injuries: Pressure Injury 02/26/24 Coccyx Mid Stage 4 - Full thickness tissue loss with exposed bone, tendon or muscle. (Active)  02/26/24 0916  Location: Coccyx  Location Orientation: Mid  Staging: Stage 4 - Full thickness tissue loss with exposed bone, tendon or muscle.  Wound Description (Comments):   DO NOT USE:  Present on Admission: Yes  Dressing Type Hydrogel;Foam - Lift dressing to assess site every shift 07/02/24 2139     Pressure Injury 02/26/24 Toe (Comment  which one) Anterior;Right Deep Tissue Pressure Injury - Purple or maroon localized area of discolored intact skin or blood-filled blister due to damage of underlying soft tissue from pressure and/or shear. right gre (Active)  02/26/24 1215  Location: Toe (Comment  which one)  Location Orientation: Anterior;Right  Staging: Deep Tissue Pressure Injury - Purple or maroon localized area of discolored intact skin or blood-filled blister due to damage of underlying soft tissue from pressure and/or shear.  Wound Description (Comments): right great toe  DO NOT USE:  Present on Admission:   Dressing Type None 06/29/24 0615     Pressure Injury 03/06/24 Hip Anterior;Left;Proximal Unstageable - Full thickness tissue loss in which the base of the injury is covered by slough (yellow, tan, gray, green or brown) and/or eschar (tan, brown or  black) in the wound bed. unstageable (Active)  03/06/24 1612  Location: Hip  Location Orientation: Anterior;Left;Proximal  Staging: Unstageable - Full thickness tissue loss in which the base of the injury is covered by slough (yellow, tan, gray, green or brown) and/or eschar (tan, brown or black) in the wound bed.  Wound Description (Comments): unstageable  DO NOT USE:  Present on Admission: No  Dressing Type Foam - Lift dressing to assess site every shift 07/02/24 2139     Pressure Injury 03/06/24 Shoulder Left Unstageable - Full thickness tissue loss in which the base of the injury is covered by slough (yellow, tan, gray, green or brown) and/or eschar (tan, brown or black) in the wound bed. unstageable (Active)  03/06/24 1613  Location: Shoulder  Location Orientation: Left  Staging: Unstageable - Full thickness tissue loss in which the base of the injury is covered by slough (yellow, tan, gray, green or brown) and/or eschar (tan, brown or black) in the wound bed.  Wound Description (Comments): unstageable  DO NOT USE:  Present on Admission: No  Dressing Type Foam - Lift dressing to assess site every shift 07/01/24 2200     Pressure Injury 04/23/24 Heel Right Unstageable - Full thickness tissue loss in which the base of the injury is covered by slough (yellow, tan, gray, green or brown) and/or eschar (tan, brown or black) in the wound bed. DTPI evolving into unstageable (Active)  04/23/24   Location: Heel  Location Orientation: Right  Staging: Unstageable - Full thickness  tissue loss in which the base of the injury is covered by slough (yellow, tan, gray, green or brown) and/or eschar (tan, brown or black) in the wound bed.  Wound Description (Comments): DTPI evolving into unstageable  DO NOT USE:  Present on Admission: No     Pressure Injury 04/23/24 Heel Left Deep Tissue Pressure Injury - Purple or maroon localized area of discolored intact skin or blood-filled blister due to damage of  underlying soft tissue from pressure and/or shear. (Active)  04/23/24   Location: Heel  Location Orientation: Left  Staging: Deep Tissue Pressure Injury - Purple or maroon localized area of discolored intact skin or blood-filled blister due to damage of underlying soft tissue from pressure and/or shear.  Wound Description (Comments):   DO NOT USE:  Present on Admission: No   DVT prophylaxis:  SCDs Start: 03/04/24 2151 Place TED hose Start: 03/04/24 2151 apixaban  (ELIQUIS ) tablet 5 mg  Code Status: DNR Family Communication: Updated patient's daughter at bedside this afternoon Level of care: Telemetry Medical Status is: Inpatient Remains inpatient appropriate because: Complicated disposition, abdominal pain, possible acute cholecystitis and failure to thrive   Final disposition: Yet to be determined   55 minutes with more than 50% spent in reviewing records, counseling patient/family and coordinating care.   Sch Meds:  Scheduled Meds:  (feeding supplement) PROSource Plus  30 mL Oral BID BM   amiodarone   200 mg Oral Daily   apixaban   5 mg Oral BID   calcium  carbonate  1 tablet Oral TID   collagenase    Topical Daily   diclofenac  Sodium  4 g Topical QID   feeding supplement  237 mL Oral BID BM   ferrous sulfate   325 mg Oral QHS   furosemide   40 mg Oral Daily   guaiFENesin   600 mg Oral BID   insulin  aspart  0-9 Units Subcutaneous TID WC   insulin  glargine-yfgn  5 Units Subcutaneous Daily   levothyroxine   50 mcg Oral Q0600   lidocaine   1 patch Transdermal Q24H   lidocaine -EPINEPHrine  (PF)  30 mL Infiltration Once   liver oil-zinc  oxide   Topical BID   multivitamin with minerals  1 tablet Oral Daily   neomycin -bacitracin -polymyxin   Apply externally Daily   nutrition supplement (JUVEN)  1 packet Oral BID BM   mouth rinse  15 mL Mouth Rinse 4 times per day   pantoprazole   40 mg Oral Daily   polyethylene glycol  17 g Oral BID   senna-docusate  1 tablet Oral QHS   sertraline    50 mg Oral Daily   sodium chloride  flush  10-40 mL Intracatheter Q12H   Tafamidis   1 capsule Oral Daily   vitamin A   10,000 Units Oral Daily   vutrisiran  sodium  25 mg Subcutaneous Q90 days   Continuous Infusions:  dextrose      PRN Meds:.acetaminophen , bismuth  subsalicylate, dextrose , food thickener, hydrOXYzine , lip balm, meclizine , ondansetron  **OR** ondansetron  (ZOFRAN ) IV, mouth rinse, artificial tears  Antimicrobials: Anti-infectives (From admission, onward)    Start     Dose/Rate Route Frequency Ordered Stop   04/27/24 1615  cefTAZidime  (FORTAZ ) 2 g in sodium chloride  0.9 % 100 mL IVPB        2 g 200 mL/hr over 30 Minutes Intravenous Every 8 hours 04/27/24 1518 05/03/24 2359   03/16/24 1015  cefTRIAXone  (ROCEPHIN ) 2 g in sodium chloride  0.9 % 100 mL IVPB        2 g 200 mL/hr over 30 Minutes Intravenous  Every 24 hours 03/16/24 0929 03/20/24 1219   03/16/24 1015  doxycycline  (VIBRA -TABS) tablet 100 mg        100 mg Oral Every 12 hours 03/16/24 0929 03/20/24 2131   03/14/24 1000  metroNIDAZOLE  (FLAGYL ) tablet 500 mg        500 mg Oral Every 12 hours 03/14/24 0803 03/19/24 2219   03/13/24 1000  cefadroxil  (DURICEF) capsule 1,000 mg  Status:  Discontinued        1,000 mg Oral 2 times daily 03/12/24 1632 03/16/24 0929   03/07/24 2200  metroNIDAZOLE  (FLAGYL ) tablet 500 mg        500 mg Oral Every 12 hours 03/07/24 0840 03/11/24 2258   03/05/24 1930  vancomycin  (VANCOREADY) IVPB 1250 mg/250 mL  Status:  Discontinued        1,250 mg 166.7 mL/hr over 90 Minutes Intravenous Every 24 hours 03/04/24 2205 03/04/24 2224   03/05/24 1930  Vancomycin  (VANCOCIN ) 1,250 mg in sodium chloride  0.9 % 250 mL IVPB  Status:  Discontinued        1,250 mg 166.7 mL/hr over 90 Minutes Intravenous Every 24 hours 03/04/24 2224 03/05/24 0717   03/05/24 1930  vancomycin  (VANCOREADY) IVPB 1500 mg/300 mL  Status:  Discontinued        1,500 mg 150 mL/hr over 120 Minutes Intravenous Every 24 hours 03/05/24 0717  03/12/24 1636   03/05/24 1400  ceFEPIme  (MAXIPIME ) 2 g in sodium chloride  0.9 % 100 mL IVPB  Status:  Discontinued        2 g 200 mL/hr over 30 Minutes Intravenous Every 8 hours 03/05/24 0717 03/12/24 1632   03/05/24 0600  ceFEPIme  (MAXIPIME ) 2 g in sodium chloride  0.9 % 100 mL IVPB  Status:  Discontinued        2 g 200 mL/hr over 30 Minutes Intravenous Every 12 hours 03/04/24 2205 03/05/24 0717   03/05/24 0000  metroNIDAZOLE  (FLAGYL ) IVPB 500 mg  Status:  Discontinued        500 mg 100 mL/hr over 60 Minutes Intravenous Every 12 hours 03/04/24 2206 03/04/24 2209   03/04/24 1730  aztreonam (AZACTAM) 2 g in sodium chloride  0.9 % 100 mL IVPB  Status:  Discontinued        2 g 200 mL/hr over 30 Minutes Intravenous  Once 03/04/24 1715 03/04/24 1724   03/04/24 1730  metroNIDAZOLE  (FLAGYL ) IVPB 500 mg  Status:  Discontinued        500 mg 100 mL/hr over 60 Minutes Intravenous Every 12 hours 03/04/24 1715 03/07/24 0840   03/04/24 1730  vancomycin  (VANCOREADY) IVPB 1500 mg/300 mL        1,500 mg 150 mL/hr over 120 Minutes Intravenous  Once 03/04/24 1715 03/04/24 2153   03/04/24 1730  ceFEPIme  (MAXIPIME ) 2 g in sodium chloride  0.9 % 100 mL IVPB        2 g 200 mL/hr over 30 Minutes Intravenous  Once 03/04/24 1724 03/04/24 1826        I have personally reviewed the following labs and images: CBC: Recent Labs  Lab 07/01/24 0414 07/03/24 0236  WBC 10.0 8.6  HGB 11.4* 12.2*  HCT 33.6* 35.8*  MCV 85.7 86.1  PLT 224 231    BMP &GFR Recent Labs  Lab 07/01/24 0414 07/02/24 0338 07/03/24 0236 07/03/24 0830  NA 137 140 139  --   K 4.9 4.9 5.5* 5.2*  CL 104 105 106  --   CO2 24 25 22   --  GLUCOSE 88 93 157*  --   BUN 47* 45* 46*  --   CREATININE 1.22 1.25* 1.15  --   CALCIUM  9.0 9.0 8.7*  --   MG 1.9 2.1 2.1  --   PHOS 3.1  --   --   --     Estimated Creatinine Clearance: 44.1 mL/min (by C-G formula based on SCr of 1.15 mg/dL). Liver & Pancreas: Recent Labs  Lab 07/01/24 0414  07/02/24 0338 07/03/24 0236  AST 67* 90* 76*  ALT 78* 85* 81*  ALKPHOS 184* 197* 204*  BILITOT 1.5* 1.6* 1.7*  PROT 5.9* 5.6* 5.9*  ALBUMIN  2.8* 2.7* 2.7*   No results for input(s): LIPASE, AMYLASE in the last 168 hours. No results for input(s): AMMONIA in the last 168 hours. Diabetic: No results for input(s): HGBA1C in the last 72 hours. Recent Labs  Lab 07/02/24 1606 07/02/24 2018 07/03/24 0445 07/03/24 0825 07/03/24 1251  GLUCAP 172* 205* 142* 127* 133*   Cardiac Enzymes: No results for input(s): CKTOTAL, CKMB, CKMBINDEX, TROPONINI in the last 168 hours. No results for input(s): PROBNP in the last 8760 hours. Coagulation Profile: No results for input(s): INR, PROTIME in the last 168 hours. Thyroid  Function Tests: No results for input(s): TSH, T4TOTAL, FREET4, T3FREE, THYROIDAB in the last 72 hours. Lipid Profile: No results for input(s): CHOL, HDL, LDLCALC, TRIG, CHOLHDL, LDLDIRECT in the last 72 hours. Anemia Panel: No results for input(s): VITAMINB12, FOLATE, FERRITIN, TIBC, IRON, RETICCTPCT in the last 72 hours. Urine analysis:    Component Value Date/Time   COLORURINE STRAW (A) 03/04/2024 2139   APPEARANCEUR CLEAR 03/04/2024 2139   LABSPEC 1.016 03/04/2024 2139   PHURINE 6.0 03/04/2024 2139   GLUCOSEU >=500 (A) 03/04/2024 2139   HGBUR SMALL (A) 03/04/2024 2139   BILIRUBINUR NEGATIVE 03/04/2024 2139   KETONESUR NEGATIVE 03/04/2024 2139   PROTEINUR 30 (A) 03/04/2024 2139   UROBILINOGEN 0.2 05/05/2015 1443   NITRITE NEGATIVE 03/04/2024 2139   LEUKOCYTESUR NEGATIVE 03/04/2024 2139   Sepsis Labs: Invalid input(s): PROCALCITONIN, LACTICIDVEN  Microbiology: No results found for this or any previous visit (from the past 240 hours).  Radiology Studies: No results found.     Marlane Hirschmann T. [name]  If 7PM-7AM, please contact night-coverage [email or web address] 07/03/2024, 2:05 PM

## 2024-07-03 NOTE — Plan of Care (Signed)
 Goals not met though pt without progress within reasonable amount of time.  PT will sign off.   Micheline Portal, PT Acute Rehabilitation Services Office:[phone] 07/03/2024

## 2024-07-03 NOTE — Progress Notes (Signed)
 Occupational Therapy Treatment Patient Details Name: Earl [name] MRN: [id number] DOB: 01/04/1941 Today's Date: 07/03/2024   History of present illness 83 y.o. male admitted from [facility] rehab on 03/04/24 with SOB, sacral pain, Afib with RVR. Pt with acute metabolic encephalopathy, rhabdomyolysis and sepsis secondary to sacral wound infection. 4/15 & 4/17 bedside sacral wound debridement. Pt also with B pleural effusion, 6/18 L thoracentesis, 7/14 L & R thoracentesis.  PMhx: adm 3/26-4/08/2024 with fall, AMS, COVID; d/c to SNF rehab. CVA (2021, residual R-side weakness), T2DM, HTN, CHF, MGUS, Afib on Eliquis .   OT comments  Pt presenting with similar clinical presentation compared to previous sessions, pt very lethargic needing encouragement to engage with therapy. He needed mod A for rolling in bed during cleanup, remains needing mod A to total A for most bed level ADLs. Pt was setup for lunch to which he could self feed with Adaptive utensils and setup to L side. Pt not progressing, seems to be at new baseline and will DC from OT services. OT to follow-up for one more session with pt daughter to provide HEP on UE strengthening.       If plan is discharge home, recommend the following:  A lot of help with walking and/or transfers;A lot of help with bathing/dressing/bathroom;Assistance with cooking/housework;Assist for transportation;Help with stairs or ramp for entrance;Two people to help with walking and/or transfers;Assistance with feeding   Equipment Recommendations  Other (comment) (defer to next level of care)    Recommendations for Other Services      Precautions / Restrictions Precautions Precautions: Fall Recall of Precautions/Restrictions: Impaired Precaution/Restrictions Comments: sacral wound; L hip wound , bilat heel wounds; L shoulder wound Restrictions Weight Bearing Restrictions Per Provider Order: No       Mobility Bed Mobility Overal bed mobility: Needs  Assistance Bed Mobility: Rolling Rolling: Mod assist, Used rails         General bed mobility comments: pt with BM, mod A to roll using bed rail and cues for pt to sequence    Transfers                         Balance                                           ADL either performed or assessed with clinical judgement   ADL   Eating/Feeding: Sitting;With adaptive utensils;Set up Eating/Feeding Details (indicate cue type and reason): Pt demonstrated ability to eat with built up utensils and setup A, drinks placed on L side for ease of access.                                   General ADL Comments: Pt needing mod A to total A for bed level ADLs, can perform some simple grooming with setup A to min A depending on the tasks has limited grasp abilty.    Extremity/Trunk Assessment Upper Extremity Assessment Upper Extremity Assessment: Generalized weakness RUE Coordination: decreased fine motor;decreased gross motor            Vision       Perception     Praxis     Communication Communication Communication: Impaired Factors Affecting Communication: Reduced clarity of speech;Difficulty expressing self (low volume)   Cognition Arousal: Alert  Behavior During Therapy: [facility] for tasks assessed/performed                                 Following commands: Impaired Following commands impaired: Follows one step commands with increased time      Cueing   Cueing Techniques: Verbal cues, Tactile cues, Visual cues  Exercises General Exercises - Lower Extremity Straight Leg Raises: AAROM, Strengthening, Both, 5 reps, Supine Other Exercises Other Exercises: bil UE bench press against yellow therabands while supine, x10 reps    Shoulder Instructions       General Comments Pt had BM during session, pt cleaned and reposistioned appropriately. LPN in room doing dressing change and NT assisting with pericare    Pertinent  Vitals/ Pain       Pain Assessment Pain Assessment: Faces Faces Pain Scale: Hurts even more Pain Location: generalized with repositioning. Pain Descriptors / Indicators: Discomfort, Grimacing Pain Intervention(s): Monitored during session, Repositioned, Limited activity within patient's tolerance  Home Living                                          Prior Functioning/Environment              Frequency  Min 1X/week        Progress Toward Goals  OT Goals(current goals can now be found in the care plan section)  Progress towards OT goals: Not progressing toward goals - comment  Acute Rehab OT Goals Patient Stated Goal: none stated OT Goal Formulation: With patient Time For Goal Achievement: 07/03/24 Potential to Achieve Goals: Fair  Plan      Co-evaluation                 AM-PAC OT 6 Clicks Daily Activity     Outcome Measure   Help from another person eating meals?: A Little Help from another person taking care of personal grooming?: A Little Help from another person toileting, which includes using toliet, bedpan, or urinal?: Total Help from another person bathing (including washing, rinsing, drying)?: A Lot Help from another person to put on and taking off regular upper body clothing?: A Lot Help from another person to put on and taking off regular lower body clothing?: Total 6 Click Score: 12    End of Session    OT Visit Diagnosis: Other abnormalities of gait and mobility (R26.89);Muscle weakness (generalized) (M62.81);History of falling (Z91.81);Feeding difficulties (R63.3);Ataxia, unspecified (R27.0) Pain - Right/Left: Left Pain - part of body:  (sacral)   Activity Tolerance Other (comment) (limited by BM)   Patient Left in bed;with call bell/phone within reach;with nursing/sitter in room   Nurse Communication Mobility status;Other (comment) (pt can feed himself with red tubing over utenils and setup)        Time:  8783-8745 OT Time Calculation (min): 38 min  Charges: OT General Charges $OT Visit: 1 Visit OT Treatments $Therapeutic Activity: 23-37 mins  07/03/2024  AB, OTR/L  Acute Rehabilitation Services  Office: [phone]   Earl [name] 07/03/2024, 3:09 PM

## 2024-07-04 ENCOUNTER — Inpatient Hospital Stay (HOSPITAL_COMMUNITY)

## 2024-07-04 ENCOUNTER — Encounter (HOSPITAL_COMMUNITY): Payer: Self-pay | Admitting: Internal Medicine

## 2024-07-04 DIAGNOSIS — A419 Sepsis, unspecified organism: Secondary | ICD-10-CM | POA: Diagnosis not present

## 2024-07-04 DIAGNOSIS — Z8673 Personal history of transient ischemic attack (TIA), and cerebral infarction without residual deficits: Secondary | ICD-10-CM | POA: Diagnosis not present

## 2024-07-04 DIAGNOSIS — I480 Paroxysmal atrial fibrillation: Secondary | ICD-10-CM | POA: Diagnosis not present

## 2024-07-04 DIAGNOSIS — L98429 Non-pressure chronic ulcer of back with unspecified severity: Secondary | ICD-10-CM | POA: Diagnosis not present

## 2024-07-04 LAB — GLUCOSE, CAPILLARY
Glucose-Capillary: 124 mg/dL — ABNORMAL HIGH (ref 70–99)
Glucose-Capillary: 85 mg/dL (ref 70–99)
Glucose-Capillary: 120 mg/dL — ABNORMAL HIGH (ref 70–99)
Glucose-Capillary: 143 mg/dL — ABNORMAL HIGH (ref 70–99)

## 2024-07-04 LAB — COMPREHENSIVE METABOLIC PANEL WITH GFR
ALT: 71 U/L — ABNORMAL HIGH (ref 0–44)
AST: 66 U/L — ABNORMAL HIGH (ref 15–41)
Albumin: 2.6 g/dL — ABNORMAL LOW (ref 3.5–5.0)
Alkaline Phosphatase: 195 U/L — ABNORMAL HIGH (ref 38–126)
Anion gap: 8 (ref 5–15)
BUN: 40 mg/dL — ABNORMAL HIGH (ref 8–23)
CO2: 26 mmol/L (ref 22–32)
Calcium: 8.9 mg/dL (ref 8.9–10.3)
Chloride: 106 mmol/L (ref 98–111)
Creatinine, Ser: 1.04 mg/dL (ref 0.61–1.24)
GFR, Estimated: >60 mL/min (ref >60)
Glucose, Bld: 100 mg/dL — ABNORMAL HIGH (ref 70–99)
Potassium: 4.5 mmol/L (ref 3.5–5.1)
Sodium: 140 mmol/L (ref 135–145)
Total Bilirubin: 1.6 mg/dL — ABNORMAL HIGH (ref 0.0–1.2)
Total Protein: 5.8 g/dL — ABNORMAL LOW (ref 6.5–8.1)

## 2024-07-04 LAB — MAGNESIUM: Magnesium: 2 mg/dL (ref 1.7–2.4)

## 2024-07-04 MED ORDER — TECHNETIUM TC 99M MEBROFENIN IV KIT
5.4000 | PACK | Freq: Once | INTRAVENOUS | Status: AC
  Administered 2024-07-04: 5.4 via INTRAVENOUS

## 2024-07-04 MED ORDER — MAGIC MOUTHWASH W/LIDOCAINE
5.0000 mL | Freq: Three times a day (TID) | ORAL | Status: AC
  Administered 2024-07-04 – 2024-07-09 (×15): 5 mL via ORAL
  Filled 2024-07-04 (×16): qty 5

## 2024-07-04 NOTE — Progress Notes (Signed)
 PROGRESS NOTE  Earl [name] FMW:[id number] DOB: 08/30/1941   PCP: [name], MD  Patient is from: SNF  DOA: 03/04/2024 LOS: 122  Chief complaints No chief complaint on file.    Brief Narrative / Interim history: 83 year old M with PMH of PAF on Eliquis , HFmrEF, amyloid cardiomyopathy, NIDDM-2, CVA with right-sided weakness, stage IV sacral wound, pancytopenia, MGUS, HTN, HLD and hypothyroidism brought to ED by EMS from [facility] rehab due to pain in sacral area and shortness of breath 4 days after he was discharged from the hospital.   Patient was hospitalized from 3/26-4/10 with generalized weakness, encephalopathy and COVID-19 infection.  Per daughter, patient sustained skin injury during this hospitalization, and he has had significant pain, discomfort and progressive foul-smelling discharge from sacral wound over the course of 4 to 5 days before coming back to ED.  No report of constitutional symptoms, chest pain or dyspnea.  In ED, hypotensive to 82/51.  In RVR to 140s.  Na 130.  Glucose elevated to 500.  Not in DKA or HHS.  WBC 13.6 with left shift.  CXR without acute finding.  CT abdomen and pelvis ordered.  Cultures obtained.  Admitted with working diagnosis of sepsis due to sacral pressure ulcer wound infection, A-fib with RVR, hypotension.  Cultures obtained.  Patient was started on broad-spectrum antibiotics and IV fluid.  PCCM, cardiology and general surgery consulted.  See plan and assessment for more on hospital course.  Subjective: Seen and examined earlier this morning.  No major events overnight or this morning.  Sleepy this morning.  No signs of distress.  N.p.o. for HIDA scan.  Objective: Vitals:   07/04/24 0007 07/04/24 0422 07/04/24 0447 07/04/24 0737  BP: 106/75 104/65  107/71  Pulse: 75 71  72  Resp: 18 18  18   Temp: 97.9 F (36.6 C) 97.6 F (36.4 C)  97.8 F (36.6 C)  TempSrc:      SpO2: 100% 98%  100%  Weight:   67.1 kg   Height:         Examination:  GENERAL: No apparent distress.  Appears frail. HEENT: MMM.  Vision and hearing grossly intact.  NECK: Supple.  No apparent JVD.  RESP: On room air.  Some WOB with talking. CVS:  RRR. Heart sounds normal.  ABD/GI/GU: BS+. Abd soft.  Nontender. MSK/EXT:  Moves extremities.  Ace wrap over BLE.  Dependent edema SKIN: Sacral, left hip, left shoulder and right heel ulcers.  See pictures under media. NEURO: Sleepy.  No apparent focal neurodeficit.  BLE weakness. PSYCH: Calm. Normal affect.   Consultants PCCM General Surgery Cardiology Infectious disease Plastic surgery Orthopedic surgery Palliative medicine  Microbiology 4/14-COVID-19 PCR positive.  Flu and RSV PCR nonreactive. 4/14-blood cultures NGTD 6/4-sacral wound culture with Pseudomonas aeruginosa and Klebsiella pneumoniae 7/14-pleural fluid culture NGTD   Assessment and plan: Severe sepsis due to sacral pressure wound infection: Present on arrival.  Presents with generalized weakness, poor appetite, hypotension pain and drainage from sacral wound.  Had leukocytosis, tachycardia, hypotension and lactic acidosis on presentation.    -Culture data as above. -4/14-CT showed sacral wound extension to the right gluteus muscle without signs of osteomyelitis. -4/15-underwent bedside debridement of sacral decubitus wound by general surgery -4/21-ID consulted and recommended continuing broad-spectrum antibiotics that he completed on 6/13. 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 -6/17- ID recommended continuing holding off antibiotics since he is HDS, afebrile without leukocytosis. -6/17-evaluated by plastic surgery.  Sacral wound felt  to be clean.  Wound care recommended -7/13-7/13-CT showed similar appearance of osteomyelitis underlying sacral decubitus ulcer.  -7/13-plastic surgery recommended for continued wound care and no surgical  management. -7/14-Ortho evaluated patient for left hip wound.  Debulked and recommended optimizing nutrition -8/5- Surgery recommended hydrotherapy for left hip wound. Surgical debridement if not successful -8/5-wound care and instruction updated  -Optimize nutrition-see dietitian instruction below. -Overall, difficult case due to patient's nutritional status and limited mobility.  Prealbumin <5. -Tylenol  and oxycodone  as needed.  Holding oxycodone  today for HIDA scan tomorrow    Chronic HFrEF/cardiac amyloidosis/NICM/hypotension: Hypotension resolved.  Recurrent pleural effusion and anasarca partly due to nutritional status and hypoalbuminemia.  Not on diuretics at home.  Was on p.o. Lasix  20 mg daily.  BNP elevated to 1900 (no prior value for comparison.  LFTs slightly up but improving.  Congestive hepatopathy?  Has poor appetite and early satiety with intermittent nausea and abdominal pain. -Continue p.o. Lasix  to 40 mg daily.  -Continue Tafamidis  and vutrisiran . -Ace wrap for BLE edema -Strict intake and output,   Recurrent bilateral pleural effusion/anasarca: Likely due to CHF, poor nutritional status/hypoalbuminemia.  Has some work of breathing as he speaks but saturating at 100% on RA.  Denies chest pain or SOB. -6/18-left-sided thoracentesis by IR.  Body fluid culture without any growth.   -7/14-bilateral thoracentesis by pulmonology.  Fluid transudative and culture negative -7/30-right thoracocentesis with removal of 500 mL of pleural fluid by IR.   -8/12-right thoracocentesis with removal of 800 cc fluid. -Diuretics as above. -Remains on room air  Possible acalculous cholecystitis:  RUQ US  raise concern for cholecystitis without cholelithiasis.  Patient had right-sided abdominal pain that seems of resolved.  No leukocytosis or fever to suggest active infection.  Symptoms and US  finding could be due to anasarca/possible congestive hepatopathy.  LFTs slightly up. -Surgery recommended  HIDA scan-in process.  Elevated liver enzymes: Chronic but slightly trended up. Acute hepatitis panel was negative.  RUQ US  as above.  He has CHF with markedly elevated BNP raising concern for congestive hepatopathy.  LFT improved -Changed scheduled Tylenol  to as needed -Continue holding Lipitor. -May have to find alternative for amiodarone  if LFT continues to trend up -Continue monitoring   IDDM-2 with hyperglycemia : A1c 8.4% on 4/3. Recent Labs  Lab 07/03/24 1251 07/03/24 1741 07/03/24 1952 07/04/24 0739 07/04/24 1359  GLUCAP 133* 136* 135* 124* 85  -Continue SSI-sensitive -Continue Semglee  5 units daily  PAF/flutter with RVR: Rate controlled. -Continue amiodarone  and Eliquis  Eliquis  -May have to find alternative for amiodarone  if LFT continues to trend up -Cardiology signed off.   History of CVA: Stable -On Eliquis .  Holding Lipitor due to LFT.   GERD: -Continue PPI   Anemia of chronic disease: Stable -Monitor intermittently   Hypothyroidism:  -Continue levothyroxine   Anxiety and adjustment disorder: -Psychiatry consulted on 7/7 and recommended continuing Zoloft  and Atarax .   Constipation: - Bowel regimen.  Hyperkalemia: Likely due to potassium supplementation: Resolved.  Goals of care: CODE STATUS DNR. 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  Palliative care consulted on 4/20.  Disposition: Per family, patient lives alone before prior hospitalization.  Family not interested in SNF or LTACH.  Also do not feel he is ready to be discharged until his wounds are down to stage II.   Severe malnutrition Body mass index is 20.64 kg/m. Nutrition Problem: Severe Malnutrition Etiology: chronic illness Signs/Symptoms: energy intake < or equal to  75% for > or equal to 1 month, severe muscle depletion Interventions: Ensure Enlive (each supplement provides 350kcal and 20 grams  of protein), Magic cup, Hormel Shake, MVI, Juven, Other (Comment) (vitamin C )  Multiple pressure skin injuries: Pressure Injury 02/26/24 Coccyx Mid Stage 4 - Full thickness tissue loss with exposed bone, tendon or muscle. (Active)  02/26/24 0916  Location: Coccyx  Location Orientation: Mid  Staging: Stage 4 - Full thickness tissue loss with exposed bone, tendon or muscle.  Wound Description (Comments):   DO NOT USE:  Present on Admission: Yes  Dressing Type Foam - Lift dressing to assess site every shift;Silver hydrofiber 07/03/24 1100     Pressure Injury 02/26/24 Toe (Comment  which one) Anterior;Right Deep Tissue Pressure Injury - Purple or maroon localized area of discolored intact skin or blood-filled blister due to damage of underlying soft tissue from pressure and/or shear. right gre (Active)  02/26/24 1215  Location: Toe (Comment  which one)  Location Orientation: Anterior;Right  Staging: Deep Tissue Pressure Injury - Purple or maroon localized area of discolored intact skin or blood-filled blister due to damage of underlying soft tissue from pressure and/or shear.  Wound Description (Comments): right great toe  DO NOT USE:  Present on Admission:   Dressing Type None 06/29/24 0615     Pressure Injury 03/06/24 Hip Anterior;Left;Proximal Unstageable - Full thickness tissue loss in which the base of the injury is covered by slough (yellow, tan, gray, green or brown) and/or eschar (tan, brown or black) in the wound bed. unstageable (Active)  03/06/24 1612  Location: Hip  Location Orientation: Anterior;Left;Proximal  Staging: Unstageable - Full thickness tissue loss in which the base of the injury is covered by slough (yellow, tan, gray, green or brown) and/or eschar (tan, brown or black) in the wound bed.  Wound Description (Comments): unstageable  DO NOT USE:  Present on Admission: No  Dressing Type Foam - Lift dressing to assess site every shift 07/03/24 1100     Pressure Injury  03/06/24 Shoulder Left Unstageable - Full thickness tissue loss in which the base of the injury is covered by slough (yellow, tan, gray, green or brown) and/or eschar (tan, brown or black) in the wound bed. unstageable (Active)  03/06/24 1613  Location: Shoulder  Location Orientation: Left  Staging: Unstageable - Full thickness tissue loss in which the base of the injury is covered by slough (yellow, tan, gray, green or brown) and/or eschar (tan, brown or black) in the wound bed.  Wound Description (Comments): unstageable  DO NOT USE:  Present on Admission: No  Dressing Type Foam - Lift dressing to assess site every shift 07/03/24 1100     Pressure Injury 04/23/24 Heel Right Unstageable - Full thickness tissue loss in which the base of the injury is covered by slough (yellow, tan, gray, green or brown) and/or eschar (tan, brown or black) in the wound bed. DTPI evolving into unstageable (Active)  04/23/24   Location: Heel  Location Orientation: Right  Staging: Unstageable - Full thickness tissue loss in which the base of the injury is covered by slough (yellow, tan, gray, green or brown) and/or eschar (tan, brown or black) in the wound bed.  Wound Description (Comments): DTPI evolving into unstageable  DO NOT USE:  Present on Admission: No     Pressure Injury 04/23/24 Heel Left Deep Tissue Pressure Injury - Purple or maroon localized area of discolored intact skin or blood-filled blister due to damage of underlying soft tissue from  pressure and/or shear. (Active)  04/23/24   Location: Heel  Location Orientation: Left  Staging: Deep Tissue Pressure Injury - Purple or maroon localized area of discolored intact skin or blood-filled blister due to damage of underlying soft tissue from pressure and/or shear.  Wound Description (Comments):   DO NOT USE:  Present on Admission: No   DVT prophylaxis:  SCDs Start: 03/04/24 2151 Place TED hose Start: 03/04/24 2151 apixaban  (ELIQUIS ) tablet 5 mg   Code Status: DNR Family Communication: None at bedside today. Level of care: Telemetry Medical Status is: Inpatient Remains inpatient appropriate because: Complicated disposition, abdominal pain, possible acute cholecystitis and failure to thrive   Final disposition: Yet to be determined   35 minutes with more than 50% spent in reviewing records, counseling patient/family and coordinating care.   Sch Meds:  Scheduled Meds:  (feeding supplement) PROSource Plus  30 mL Oral BID BM   amiodarone   200 mg Oral Daily   apixaban   5 mg Oral BID   calcium  carbonate  1 tablet Oral TID   collagenase    Topical Daily   diclofenac  Sodium  4 g Topical QID   feeding supplement  237 mL Oral BID BM   ferrous sulfate   325 mg Oral QHS   furosemide   40 mg Oral Daily   guaiFENesin   600 mg Oral BID   insulin  aspart  0-9 Units Subcutaneous TID WC   insulin  glargine-yfgn  5 Units Subcutaneous Daily   levothyroxine   50 mcg Oral Q0600   lidocaine   1 patch Transdermal Q24H   lidocaine -EPINEPHrine  (PF)  30 mL Infiltration Once   liver oil-zinc  oxide   Topical BID   multivitamin with minerals  1 tablet Oral Daily   neomycin -bacitracin -polymyxin   Apply externally Daily   nutrition supplement (JUVEN)  1 packet Oral BID BM   mouth rinse  15 mL Mouth Rinse 4 times per day   pantoprazole   40 mg Oral Daily   polyethylene glycol  17 g Oral BID   senna-docusate  1 tablet Oral QHS   sertraline   50 mg Oral Daily   sodium chloride  flush  10-40 mL Intracatheter Q12H   Tafamidis   1 capsule Oral Daily   vitamin A   10,000 Units Oral Daily   vutrisiran  sodium  25 mg Subcutaneous Q90 days   Continuous Infusions:  dextrose      PRN Meds:.acetaminophen , bismuth  subsalicylate, dextrose , food thickener, hydrOXYzine , lip balm, meclizine , ondansetron  **OR** ondansetron  (ZOFRAN ) IV, mouth rinse, artificial tears  Antimicrobials: Anti-infectives (From admission, onward)    Start     Dose/Rate Route Frequency Ordered  Stop   04/27/24 1615  cefTAZidime  (FORTAZ ) 2 g in sodium chloride  0.9 % 100 mL IVPB        2 g 200 mL/hr over 30 Minutes Intravenous Every 8 hours 04/27/24 1518 05/03/24 2359   03/16/24 1015  cefTRIAXone  (ROCEPHIN ) 2 g in sodium chloride  0.9 % 100 mL IVPB        2 g 200 mL/hr over 30 Minutes Intravenous Every 24 hours 03/16/24 0929 03/20/24 1219   03/16/24 1015  doxycycline  (VIBRA -TABS) tablet 100 mg        100 mg Oral Every 12 hours 03/16/24 0929 03/20/24 2131   03/14/24 1000  metroNIDAZOLE  (FLAGYL ) tablet 500 mg        500 mg Oral Every 12 hours 03/14/24 0803 03/19/24 2219   03/13/24 1000  cefadroxil  (DURICEF) capsule 1,000 mg  Status:  Discontinued        1,000  mg Oral 2 times daily 03/12/24 1632 03/16/24 0929   03/07/24 2200  metroNIDAZOLE  (FLAGYL ) tablet 500 mg        500 mg Oral Every 12 hours 03/07/24 0840 03/11/24 2258   03/05/24 1930  vancomycin  (VANCOREADY) IVPB 1250 mg/250 mL  Status:  Discontinued        1,250 mg 166.7 mL/hr over 90 Minutes Intravenous Every 24 hours 03/04/24 2205 03/04/24 2224   03/05/24 1930  Vancomycin  (VANCOCIN ) 1,250 mg in sodium chloride  0.9 % 250 mL IVPB  Status:  Discontinued        1,250 mg 166.7 mL/hr over 90 Minutes Intravenous Every 24 hours 03/04/24 2224 03/05/24 0717   03/05/24 1930  vancomycin  (VANCOREADY) IVPB 1500 mg/300 mL  Status:  Discontinued        1,500 mg 150 mL/hr over 120 Minutes Intravenous Every 24 hours 03/05/24 0717 03/12/24 1636   03/05/24 1400  ceFEPIme  (MAXIPIME ) 2 g in sodium chloride  0.9 % 100 mL IVPB  Status:  Discontinued        2 g 200 mL/hr over 30 Minutes Intravenous Every 8 hours 03/05/24 0717 03/12/24 1632   03/05/24 0600  ceFEPIme  (MAXIPIME ) 2 g in sodium chloride  0.9 % 100 mL IVPB  Status:  Discontinued        2 g 200 mL/hr over 30 Minutes Intravenous Every 12 hours 03/04/24 2205 03/05/24 0717   03/05/24 0000  metroNIDAZOLE  (FLAGYL ) IVPB 500 mg  Status:  Discontinued        500 mg 100 mL/hr over 60 Minutes  Intravenous Every 12 hours 03/04/24 2206 03/04/24 2209   03/04/24 1730  aztreonam (AZACTAM) 2 g in sodium chloride  0.9 % 100 mL IVPB  Status:  Discontinued        2 g 200 mL/hr over 30 Minutes Intravenous  Once 03/04/24 1715 03/04/24 1724   03/04/24 1730  metroNIDAZOLE  (FLAGYL ) IVPB 500 mg  Status:  Discontinued        500 mg 100 mL/hr over 60 Minutes Intravenous Every 12 hours 03/04/24 1715 03/07/24 0840   03/04/24 1730  vancomycin  (VANCOREADY) IVPB 1500 mg/300 mL        1,500 mg 150 mL/hr over 120 Minutes Intravenous  Once 03/04/24 1715 03/04/24 2153   03/04/24 1730  ceFEPIme  (MAXIPIME ) 2 g in sodium chloride  0.9 % 100 mL IVPB        2 g 200 mL/hr over 30 Minutes Intravenous  Once 03/04/24 1724 03/04/24 1826        I have personally reviewed the following labs and images: CBC: Recent Labs  Lab 07/01/24 0414 07/03/24 0236  WBC 10.0 8.6  HGB 11.4* 12.2*  HCT 33.6* 35.8*  MCV 85.7 86.1  PLT 224 231    BMP &GFR Recent Labs  Lab 07/01/24 0414 07/02/24 0338 07/03/24 0236 07/03/24 0830 07/04/24 1056  NA 137 140 139  --  140  K 4.9 4.9 5.5* 5.2* 4.5  CL 104 105 106  --  106  CO2 24 25 22   --  26  GLUCOSE 88 93 157*  --  100*  BUN 47* 45* 46*  --  40*  CREATININE 1.22 1.25* 1.15  --  1.04  CALCIUM  9.0 9.0 8.7*  --  8.9  MG 1.9 2.1 2.1  --  2.0  PHOS 3.1  --   --   --   --     Estimated Creatinine Clearance: 51.1 mL/min (by C-G formula based on SCr of 1.04 mg/dL). Liver &  Pancreas: Recent Labs  Lab 07/01/24 0414 07/02/24 0338 07/03/24 0236 07/04/24 1056  AST 67* 90* 76* 66*  ALT 78* 85* 81* 71*  ALKPHOS 184* 197* 204* 195*  BILITOT 1.5* 1.6* 1.7* 1.6*  PROT 5.9* 5.6* 5.9* 5.8*  ALBUMIN  2.8* 2.7* 2.7* 2.6*   No results for input(s): LIPASE, AMYLASE in the last 168 hours. No results for input(s): AMMONIA in the last 168 hours. Diabetic: No results for input(s): HGBA1C in the last 72 hours. Recent Labs  Lab 07/03/24 1251 07/03/24 1741  07/03/24 1952 07/04/24 0739 07/04/24 1359  GLUCAP 133* 136* 135* 124* 85   Cardiac Enzymes: No results for input(s): CKTOTAL, CKMB, CKMBINDEX, TROPONINI in the last 168 hours. No results for input(s): PROBNP in the last 8760 hours. Coagulation Profile: No results for input(s): INR, PROTIME in the last 168 hours. Thyroid  Function Tests: No results for input(s): TSH, T4TOTAL, FREET4, T3FREE, THYROIDAB in the last 72 hours. Lipid Profile: No results for input(s): CHOL, HDL, LDLCALC, TRIG, CHOLHDL, LDLDIRECT in the last 72 hours. Anemia Panel: No results for input(s): VITAMINB12, FOLATE, FERRITIN, TIBC, IRON, RETICCTPCT in the last 72 hours. Urine analysis:    Component Value Date/Time   COLORURINE STRAW (A) 03/04/2024 2139   APPEARANCEUR CLEAR 03/04/2024 2139   LABSPEC 1.016 03/04/2024 2139   PHURINE 6.0 03/04/2024 2139   GLUCOSEU >=500 (A) 03/04/2024 2139   HGBUR SMALL (A) 03/04/2024 2139   BILIRUBINUR NEGATIVE 03/04/2024 2139   KETONESUR NEGATIVE 03/04/2024 2139   PROTEINUR 30 (A) 03/04/2024 2139   UROBILINOGEN 0.2 05/05/2015 1443   NITRITE NEGATIVE 03/04/2024 2139   LEUKOCYTESUR NEGATIVE 03/04/2024 2139   Sepsis Labs: Invalid input(s): PROCALCITONIN, LACTICIDVEN  Microbiology: No results found for this or any previous visit (from the past 240 hours).  Radiology Studies: No results found.     Louiza Moor T. Lillie Bollig [name]  If 7PM-7AM, please contact night-coverage [email or web address] 07/04/2024, 2:02 PM

## 2024-07-04 NOTE — Plan of Care (Signed)
  Problem: Fluid Volume: Goal: Hemodynamic stability will improve Outcome: Progressing   Problem: Clinical Measurements: Goal: Diagnostic test results will improve Outcome: Progressing Goal: Signs and symptoms of infection will decrease Outcome: Progressing   Problem: Education: Goal: Knowledge of risk factors and measures for prevention of condition will improve Outcome: Progressing   Problem: Coping: Goal: Psychosocial and spiritual needs will be supported Outcome: Progressing   Problem: Respiratory: Goal: Will maintain a patent airway Outcome: Progressing Goal: Complications related to the disease process, condition or treatment will be avoided or minimized Outcome: Progressing   Problem: Education: Goal: Ability to describe self-care measures that may prevent or decrease complications (Diabetes Survival Skills Education) will improve Outcome: Progressing   Problem: Coping: Goal: Ability to adjust to condition or change in health will improve Outcome: Progressing   Problem: Fluid Volume: Goal: Ability to maintain a balanced intake and output will improve Outcome: Progressing   Problem: Health Behavior/Discharge Planning: Goal: Ability to manage health-related needs will improve Outcome: Progressing   Problem: Metabolic: Goal: Ability to maintain appropriate glucose levels will improve Outcome: Progressing   Problem: Nutritional: Goal: Maintenance of adequate nutrition will improve Outcome: Progressing Goal: Progress toward achieving an optimal weight will improve Outcome: Progressing   Problem: Skin Integrity: Goal: Risk for impaired skin integrity will decrease Outcome: Progressing   Problem: Tissue Perfusion: Goal: Adequacy of tissue perfusion will improve Outcome: Progressing   Problem: Education: Goal: Knowledge of General Education information will improve Description: Including pain rating scale, medication(s)/side effects and non-pharmacologic  comfort measures Outcome: Progressing   Problem: Health Behavior/Discharge Planning: Goal: Ability to manage health-related needs will improve Outcome: Progressing   Problem: Clinical Measurements: Goal: Ability to maintain clinical measurements within normal limits will improve Outcome: Progressing Goal: Will remain free from infection Outcome: Progressing Goal: Diagnostic test results will improve Outcome: Progressing Goal: Respiratory complications will improve Outcome: Progressing Goal: Cardiovascular complication will be avoided Outcome: Progressing   Problem: Activity: Goal: Risk for activity intolerance will decrease Outcome: Progressing   Problem: Nutrition: Goal: Adequate nutrition will be maintained Outcome: Progressing   Problem: Coping: Goal: Level of anxiety will decrease Outcome: Progressing   Problem: Elimination: Goal: Will not experience complications related to bowel motility Outcome: Progressing Goal: Will not experience complications related to urinary retention Outcome: Progressing   Problem: Pain Managment: Goal: General experience of comfort will improve and/or be controlled Outcome: Progressing   Problem: Safety: Goal: Ability to remain free from injury will improve Outcome: Progressing   Problem: Skin Integrity: Goal: Risk for impaired skin integrity will decrease Outcome: Progressing

## 2024-07-05 ENCOUNTER — Inpatient Hospital Stay (HOSPITAL_COMMUNITY)

## 2024-07-05 DIAGNOSIS — L98429 Non-pressure chronic ulcer of back with unspecified severity: Secondary | ICD-10-CM | POA: Diagnosis not present

## 2024-07-05 DIAGNOSIS — Z8673 Personal history of transient ischemic attack (TIA), and cerebral infarction without residual deficits: Secondary | ICD-10-CM | POA: Diagnosis not present

## 2024-07-05 DIAGNOSIS — I480 Paroxysmal atrial fibrillation: Secondary | ICD-10-CM | POA: Diagnosis not present

## 2024-07-05 DIAGNOSIS — A419 Sepsis, unspecified organism: Secondary | ICD-10-CM | POA: Diagnosis not present

## 2024-07-05 LAB — GLUCOSE, CAPILLARY
Glucose-Capillary: 116 mg/dL — ABNORMAL HIGH (ref 70–99)
Glucose-Capillary: 143 mg/dL — ABNORMAL HIGH (ref 70–99)
Glucose-Capillary: 143 mg/dL — ABNORMAL HIGH (ref 70–99)
Glucose-Capillary: 140 mg/dL — ABNORMAL HIGH (ref 70–99)

## 2024-07-05 MED ORDER — IOHEXOL 350 MG/ML SOLN
75.0000 mL | Freq: Once | INTRAVENOUS | Status: AC | PRN
  Administered 2024-07-05: 75 mL via INTRAVENOUS

## 2024-07-05 MED ORDER — FUROSEMIDE 40 MG PO TABS
40.0000 mg | ORAL_TABLET | Freq: Every day | ORAL | Status: DC
  Administered 2024-07-11 – 2024-07-20 (×10): 40 mg via ORAL
  Filled 2024-07-05 (×10): qty 1

## 2024-07-05 MED ORDER — FUROSEMIDE 10 MG/ML IJ SOLN
40.0000 mg | Freq: Every day | INTRAMUSCULAR | Status: AC
  Administered 2024-07-08 – 2024-07-10 (×3): 40 mg via INTRAVENOUS
  Filled 2024-07-05 (×3): qty 4

## 2024-07-05 MED ORDER — ALBUMIN HUMAN 25 % IV SOLN
12.5000 g | Freq: Every day | INTRAVENOUS | Status: AC
  Administered 2024-07-05 – 2024-07-07 (×3): 12.5 g via INTRAVENOUS
  Filled 2024-07-05 (×3): qty 50

## 2024-07-05 NOTE — Plan of Care (Signed)
  Problem: Fluid Volume: Goal: Hemodynamic stability will improve Outcome: Progressing   Problem: Clinical Measurements: Goal: Diagnostic test results will improve Outcome: Progressing Goal: Signs and symptoms of infection will decrease Outcome: Progressing   Problem: Education: Goal: Knowledge of risk factors and measures for prevention of condition will improve Outcome: Progressing   Problem: Coping: Goal: Psychosocial and spiritual needs will be supported Outcome: Progressing   Problem: Respiratory: Goal: Will maintain a patent airway Outcome: Progressing Goal: Complications related to the disease process, condition or treatment will be avoided or minimized Outcome: Progressing   Problem: Education: Goal: Ability to describe self-care measures that may prevent or decrease complications (Diabetes Survival Skills Education) will improve Outcome: Progressing   Problem: Coping: Goal: Ability to adjust to condition or change in health will improve Outcome: Progressing   Problem: Fluid Volume: Goal: Ability to maintain a balanced intake and output will improve Outcome: Progressing   Problem: Health Behavior/Discharge Planning: Goal: Ability to manage health-related needs will improve Outcome: Progressing   Problem: Metabolic: Goal: Ability to maintain appropriate glucose levels will improve Outcome: Progressing   Problem: Nutritional: Goal: Maintenance of adequate nutrition will improve Outcome: Progressing Goal: Progress toward achieving an optimal weight will improve Outcome: Progressing   Problem: Skin Integrity: Goal: Risk for impaired skin integrity will decrease Outcome: Progressing   Problem: Tissue Perfusion: Goal: Adequacy of tissue perfusion will improve Outcome: Progressing   Problem: Education: Goal: Knowledge of General Education information will improve Description: Including pain rating scale, medication(s)/side effects and non-pharmacologic  comfort measures Outcome: Progressing   Problem: Health Behavior/Discharge Planning: Goal: Ability to manage health-related needs will improve Outcome: Progressing   Problem: Clinical Measurements: Goal: Ability to maintain clinical measurements within normal limits will improve Outcome: Progressing Goal: Will remain free from infection Outcome: Progressing Goal: Diagnostic test results will improve Outcome: Progressing Goal: Respiratory complications will improve Outcome: Progressing Goal: Cardiovascular complication will be avoided Outcome: Progressing   Problem: Activity: Goal: Risk for activity intolerance will decrease Outcome: Progressing   Problem: Nutrition: Goal: Adequate nutrition will be maintained Outcome: Progressing   Problem: Coping: Goal: Level of anxiety will decrease Outcome: Progressing   Problem: Elimination: Goal: Will not experience complications related to bowel motility Outcome: Progressing Goal: Will not experience complications related to urinary retention Outcome: Progressing   Problem: Pain Managment: Goal: General experience of comfort will improve and/or be controlled Outcome: Progressing   Problem: Safety: Goal: Ability to remain free from injury will improve Outcome: Progressing   Problem: Skin Integrity: Goal: Risk for impaired skin integrity will decrease Outcome: Progressing

## 2024-07-05 NOTE — Plan of Care (Signed)
 Updated patient's daughter over the phone

## 2024-07-05 NOTE — Progress Notes (Signed)
 Physical Therapy Wound Treatment Patient Details  Name: Earl [name] MRN: [id number] Date of Birth: August 09, 1941  Today's Date: 07/05/2024 Time: 9194-9154 Time Calculation (min): 40 min  Subjective  Subjective Assessment Subjective: WOC RN in room for assessment prior to PT wound session Patient and Family Stated Goals: Heal wounds Date of Onset:  (unknown) Prior Treatments: Dressing changes, off weighting  Pain Score:  8/10 pt received Tylenol  prior to treatment, does not want anything stronger  Wound Assessment  Pressure Injury 03/06/24 Hip Anterior;Left;Proximal Unstageable - Full thickness tissue loss in which the base of the injury is covered by slough (yellow, tan, gray, green or brown) and/or eschar (tan, brown or black) in the wound bed. unstageable (Active)  Wound Image   07/02/24 1400  Dressing Type Other (Comment);Foam - Lift dressing to assess site every shift;Gauze (Comment);Santyl ;Barrier Film (skin prep) 07/05/24 0900  Dressing Clean, Dry, Intact 07/05/24 0900  Dressing Change Frequency Daily 07/05/24 0900  State of Healing Non-healing 07/05/24 0900  Site / Wound Assessment Yellow 07/05/24 0900  % Wound base Red or Granulating 5% 07/05/24 0900  % Wound base Yellow/Fibrinous Exudate 75% 07/05/24 0900  % Wound base Black/Eschar 0% 07/05/24 0900  % Wound base Other/Granulation Tissue (Comment) 20% 07/05/24 0900  Peri-wound Assessment Denuded;Induration;Pink;Erythema (non-blanchable) 07/05/24 0900  Wound Length (cm) 4.1 cm 07/02/24 1400  Wound Width (cm) 3.2 cm 07/02/24 1400  Wound Depth (cm) 4.2 cm 07/02/24 1400  Wound Surface Area (cm^2) 13.12 cm^2 07/02/24 1400  Wound Volume (cm^3) 55.1 cm^3 07/02/24 1400  Tunneling (cm) 0 05/17/24 1457  Undermining (cm) 12 o'clock 0.5, 1 o'clock 1.3, 2 o'clock 1.1, 3 o'clock 0.7, 6'o'clock 0.2, 9 o'clock 0.2 07/02/24 1400  Margins Unattached edges (unapproximated) 07/05/24 0900  Drainage Amount Moderate 07/05/24 0900  Drainage  Description Odor - foul;Serous 07/05/24 0900  Treatment Cleansed;Debridement (Selective);Irrigation;Packing (Saline gauze) 07/05/24 0900      Selective Debridement (non-excisional) Selective Debridement (non-excisional) - Location: L hip Selective Debridement (non-excisional) - Tools Used: Forceps, Scalpel Selective Debridement (non-excisional) - Tissue Removed: Slough & non-viable adipose tissue    Wound Assessment and Plan  Wound Therapy - Assess/Plan/Recommendations Wound Therapy - Clinical Statement: Santyl  has done a good job of softening necrotic tissue, now the base of the wound can be visualized and is found to be striated fascial tissue. Currently, the fascia is not necrosed. The wound is progressing in the superior posterior direction. The superficial skin above this area is indurated ane with gentle pressure releases  puss into the wound bed. From the pattern of the induration the overall size of the wound will be greatly increased. Pt will continue to benefit from selective debridement to reduce bioburden, and eventually will reveal the full extent of the wound. Wound Therapy - Functional Problem List: Decreased tolerance for position changes or OOB. Factors Delaying/Impairing Wound Healing: Diabetes Mellitus, Immobility, Infection - systemic/local, Multiple medical problems Hydrotherapy Plan: Debridement, Dressing change, Patient/family education Wound Therapy - Frequency: 2X / week Wound Therapy - Follow Up Recommendations: dressing changes by RN  Wound Therapy Goals- Improve the function of patient's integumentary system by progressing the wound(s) through the phases of wound healing (inflammation - proliferation - remodeling) by: Wound Therapy Goals - Improve the function of patient's integumentary system by progressing the wound(s) through the phases of wound healing by: Decrease Necrotic Tissue to: 20% Decrease Necrotic Tissue - Progress: Not progressing Increase Granulation  Tissue to: 80% Increase Granulation Tissue - Progress: Not progressing Goals/treatment plan/discharge plan were made with and  agreed upon by patient/family: Yes Time For Goal Achievement: 7 days Wound Therapy - Potential for Goals: Fair  Goals will be updated until maximal potential achieved or discharge criteria met.  Discharge criteria: when goals achieved, discharge from hospital, MD decision/surgical intervention, no progress towards goals, refusal/missing three consecutive treatments without notification or medical reason.  GP     Charges PT Wound Care Charges $Wound Debridement up to 20 cm: < or equal to 20 cm $PT Hydrotherapy Dressing: 2 dressings $PT Hydrotherapy Visit: 1 Visit     Jamillah Camilo B. [name] PT, DPT Acute Rehabilitation Services Please use secure chat or  Call Office [phone]   [name] 07/05/2024, 9:15 AM

## 2024-07-05 NOTE — Progress Notes (Signed)
 PROGRESS NOTE  Earl [name] FMW:[id number] DOB: 1941-10-16   PCP: [name], MD  Patient is from: SNF  DOA: 03/04/2024 LOS: 123  Chief complaints No chief complaint on file.    Brief Narrative / Interim history: 83 year old M with PMH of PAF on Eliquis , HFmrEF, amyloid cardiomyopathy, NIDDM-2, CVA with right-sided weakness, stage IV sacral wound, pancytopenia, MGUS, HTN, HLD and hypothyroidism brought to ED by EMS from [facility] rehab due to pain in sacral area and shortness of breath 4 days after he was discharged from the hospital.   Patient was hospitalized from 3/26-4/10 with generalized weakness, encephalopathy and COVID-19 infection.  Per daughter, patient sustained skin injury during this hospitalization, and he has had significant pain, discomfort and progressive foul-smelling discharge from sacral wound over the course of 4 to 5 days before coming back to ED.  No report of constitutional symptoms, chest pain or dyspnea.  In ED, hypotensive to 82/51.  In RVR to 140s.  Na 130.  Glucose elevated to 500.  Not in DKA or HHS.  WBC 13.6 with left shift.  CXR without acute finding.  CT abdomen and pelvis ordered.  Cultures obtained.  Admitted with working diagnosis of sepsis due to sacral pressure ulcer wound infection, A-fib with RVR, hypotension.  Cultures obtained.  Patient was started on broad-spectrum antibiotics and IV fluid.  PCCM, cardiology and general surgery consulted.  See plan and assessment for more on hospital course.  Subjective: Seen and examined earlier this morning.  No major events overnight or this morning.  Sleepy this morning.  No signs of distress.  N.p.o. for HIDA scan.  Objective: Vitals:   07/04/24 2046 07/05/24 0454 07/05/24 0500 07/05/24 0920  BP: 119/73 115/73  111/70  Pulse: 73 75  72  Resp: 17 18    Temp: 97.9 F (36.6 C) 97.7 F (36.5 C)  97.9 F (36.6 C)  TempSrc:      SpO2: 98% 100%  99%  Weight:   67.1 kg   Height:         Examination:  GENERAL: No apparent distress.  Appears frail. HEENT: MMM.  Vision and hearing grossly intact.  NECK: Supple.  No apparent JVD.  RESP: On room air.  Some WOB with talking. CVS:  RRR. Heart sounds normal.  ABD/GI/GU: BS+. Abd soft.  Nontender. MSK/EXT:  Moves extremities.  Ace wrap over BLE.  Dependent edema SKIN: Sacral, left hip, left shoulder and right heel ulcers.  See pictures under media. NEURO: Sleepy.  No apparent focal neurodeficit.  BLE weakness. PSYCH: Calm. Normal affect.   Consultants PCCM General Surgery Cardiology Infectious disease Plastic surgery Orthopedic surgery Palliative medicine  Microbiology 4/14-COVID-19 PCR positive.  Flu and RSV PCR nonreactive. 4/14-blood cultures NGTD 6/4-sacral wound culture with Pseudomonas aeruginosa and Klebsiella pneumoniae 7/14-pleural fluid culture NGTD   Assessment and plan: Severe sepsis due to sacral pressure wound infection: Present on arrival.  Presents with generalized weakness, poor appetite, hypotension pain and drainage from sacral wound.  Had leukocytosis, tachycardia, hypotension and lactic acidosis on presentation.    -Culture data as above. -4/14-CT showed sacral wound extension to the right gluteus muscle without signs of osteomyelitis. -4/15-underwent bedside debridement of sacral decubitus wound by general surgery -4/21-ID consulted and recommended continuing broad-spectrum antibiotics that he completed on 6/13. 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 -6/17- ID recommended continuing holding off antibiotics since he is HDS, afebrile without leukocytosis. -6/17-evaluated by plastic surgery.  Sacral wound felt  to be clean.  Wound care recommended -7/13-7/13-CT showed similar appearance of osteomyelitis underlying sacral decubitus ulcer.  -7/13-plastic surgery recommended for continued wound care and no surgical  management. -7/14-Ortho evaluated patient for left hip wound.  Debulked and recommended optimizing nutrition -8/5- Surgery recommended hydrotherapy for left hip wound. Surgical debridement if not successful -8/5-wound care and instruction updated  -8/15-left hip wound worse with tunneling. Daughter concerned about this. Will get CT -Overall, difficult case due to patient's nutritional status and limited mobility.  Prealbumin <5. -Tylenol  and oxycodone  as needed.      Chronic HFrEF/cardiac amyloidosis/NICM/hypotension: Hypotension resolved.  Recurrent pleural effusion and anasarca partly due to nutritional status and hypoalbuminemia.  Not on diuretics at home.  BNP elevated to 1900 (no prior value for comparison.  LFTs slightly up but improving.  Congestive hepatopathy?  Significant anasarca on exam.  Has poor appetite and early satiety with intermittent nausea and abdominal pain. -IV Lasix  40 mg daily with IV albumin  for 3 days followed by p.o. Lasix  40 mg daily -Continue Tafamidis  and vutrisiran . -Ace wrap for BLE edema -Strict intake and output, daily weights, renal functions and electrolytes   Recurrent bilateral pleural effusion/anasarca: Likely due to CHF, poor nutritional status/hypoalbuminemia.  Has some work of breathing as he speaks but saturating at 100% on RA.  Denies chest pain or SOB. -6/18-left-sided thoracentesis by IR.  Body fluid culture without any growth.   -7/14-bilateral thoracentesis by pulmonology.  Fluid transudative and culture negative -7/30-right thoracocentesis with removal of 500 mL of pleural fluid by IR.   -8/12-right thoracocentesis with removal of 800 cc fluid. -Diuretics as above. -Remains on room air  Acute cholecystitis ruled out:  RUQ US  raise concern for cholecystitis without cholelithiasis.  However, HIDA scan negative for acute cholecystitis.  Ultrasound finding likely due to anasarca/fluid from third spacing. - Diuretics and IV albumin  as  above  Elevated liver enzymes: Chronic but slightly trended up. Acute hepatitis panel was negative.  RUQ US  as above.  He has CHF with markedly elevated BNP raising concern for congestive hepatopathy.  LFT improved -Changed scheduled Tylenol  to as needed -Continue holding Lipitor. -May have to find alternative for amiodarone  if LFT continues to trend up -Continue monitoring   IDDM-2 with hyperglycemia : A1c 8.4% on 4/3. Recent Labs  Lab 07/04/24 0739 07/04/24 1359 07/04/24 1610 07/04/24 2108 07/05/24 0634  GLUCAP 124* 85 120* 143* 116*  -Continue SSI-sensitive -Continue Semglee  5 units daily  PAF/flutter with RVR: Rate controlled. -Continue amiodarone  and Eliquis  Eliquis  -May have to find alternative for amiodarone  if LFT continues to trend up -Cardiology signed off.   History of CVA: Stable -On Eliquis .  Holding Lipitor due to LFT.   GERD: -Continue PPI   Anemia of chronic disease: Stable -Monitor intermittently   Hypothyroidism:  -Continue levothyroxine   Anxiety and adjustment disorder: -Psychiatry consulted on 7/7 and recommended continuing Zoloft  and Atarax .   Constipation: - Bowel regimen.  Hyperkalemia: Likely due to potassium supplementation: Resolved.  Goals of care: CODE STATUS DNR. 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  Palliative care following.  Disposition: Per family, patient lives alone before prior hospitalization.  Family not interested in SNF or LTACH.  They also do not feel he is ready to be discharged until his wounds are down to stage II.  Unfortunately, patient is bedbound with poor nutritional status which makes it difficult for the wound to heal or improve.  Discussed this with  patient's daughter at bedside on 8/13.  Severe malnutrition Body mass index is 20.63 kg/m. Nutrition Problem: Severe Malnutrition Etiology: chronic  illness Signs/Symptoms: energy intake < or equal to 75% for > or equal to 1 month, severe muscle depletion Interventions: Ensure Enlive (each supplement provides 350kcal and 20 grams of protein), Magic cup, Hormel Shake, MVI, Juven, Other (Comment) (vitamin C )  Multiple pressure skin injuries: Pressure Injury 02/26/24 Coccyx Mid Stage 4 - Full thickness tissue loss with exposed bone, tendon or muscle. (Active)  02/26/24 0916  Location: Coccyx  Location Orientation: Mid  Staging: Stage 4 - Full thickness tissue loss with exposed bone, tendon or muscle.  Wound Description (Comments):   DO NOT USE:  Present on Admission: Yes  Dressing Type Foam - Lift dressing to assess site every shift 07/05/24 0819     Pressure Injury 02/26/24 Toe (Comment  which one) Anterior;Right Deep Tissue Pressure Injury - Purple or maroon localized area of discolored intact skin or blood-filled blister due to damage of underlying soft tissue from pressure and/or shear. right gre (Active)  02/26/24 1215  Location: Toe (Comment  which one)  Location Orientation: Anterior;Right  Staging: Deep Tissue Pressure Injury - Purple or maroon localized area of discolored intact skin or blood-filled blister due to damage of underlying soft tissue from pressure and/or shear.  Wound Description (Comments): right great toe  DO NOT USE:  Present on Admission:   Dressing Type None 06/29/24 0615     Pressure Injury 03/06/24 Hip Anterior;Left;Proximal Unstageable - Full thickness tissue loss in which the base of the injury is covered by slough (yellow, tan, gray, green or brown) and/or eschar (tan, brown or black) in the wound bed. unstageable (Active)  03/06/24 1612  Location: Hip  Location Orientation: Anterior;Left;Proximal  Staging: Unstageable - Full thickness tissue loss in which the base of the injury is covered by slough (yellow, tan, gray, green or brown) and/or eschar (tan, brown or black) in the wound bed.  Wound Description  (Comments): unstageable  DO NOT USE:  Present on Admission: No  Dressing Type Other (Comment);Foam - Lift dressing to assess site every shift;Gauze (Comment);Santyl ;Barrier Film (skin prep) (vashe, 2  Kling) 07/05/24 0900     Pressure Injury 03/06/24 Shoulder Left Unstageable - Full thickness tissue loss in which the base of the injury is covered by slough (yellow, tan, gray, green or brown) and/or eschar (tan, brown or black) in the wound bed. unstageable (Active)  03/06/24 1613  Location: Shoulder  Location Orientation: Left  Staging: Unstageable - Full thickness tissue loss in which the base of the injury is covered by slough (yellow, tan, gray, green or brown) and/or eschar (tan, brown or black) in the wound bed.  Wound Description (Comments): unstageable  DO NOT USE:  Present on Admission: No  Dressing Type Foam - Lift dressing to assess site every shift 07/05/24 0819     Pressure Injury 04/23/24 Heel Right Unstageable - Full thickness tissue loss in which the base of the injury is covered by slough (yellow, tan, gray, green or brown) and/or eschar (tan, brown or black) in the wound bed. DTPI evolving into unstageable (Active)  04/23/24   Location: Heel  Location Orientation: Right  Staging: Unstageable - Full thickness tissue loss in which the base of the injury is covered by slough (yellow, tan, gray, green or brown) and/or eschar (tan, brown or black) in the wound bed.  Wound Description (Comments): DTPI evolving into unstageable  DO NOT USE:  Present  on Admission: No  Dressing Type Foam - Lift dressing to assess site every shift 07/05/24 0819     Pressure Injury 04/23/24 Heel Left Deep Tissue Pressure Injury - Purple or maroon localized area of discolored intact skin or blood-filled blister due to damage of underlying soft tissue from pressure and/or shear. (Active)  04/23/24   Location: Heel  Location Orientation: Left  Staging: Deep Tissue Pressure Injury - Purple or maroon  localized area of discolored intact skin or blood-filled blister due to damage of underlying soft tissue from pressure and/or shear.  Wound Description (Comments):   DO NOT USE:  Present on Admission: No  Dressing Type Foam - Lift dressing to assess site every shift 07/05/24 0819   DVT prophylaxis:  SCDs Start: 03/04/24 2151 Place TED hose Start: 03/04/24 2151 apixaban  (ELIQUIS ) tablet 5 mg  Code Status: DNR Family Communication: Attempted to call patient's daughter but no answer. Didn't leave voice mail Level of care: Telemetry Medical Status is: Inpatient Remains inpatient appropriate because: Complicated disposition, pressure ulcer and failure to thrive   Final disposition: Yet to be determined   35 minutes with more than 50% spent in reviewing records, counseling patient/family and coordinating care.   Sch Meds:  Scheduled Meds:  (feeding supplement) PROSource Plus  30 mL Oral BID BM   amiodarone   200 mg Oral Daily   apixaban   5 mg Oral BID   calcium  carbonate  1 tablet Oral TID   collagenase    Topical Daily   diclofenac  Sodium  4 g Topical QID   feeding supplement  237 mL Oral BID BM   ferrous sulfate   325 mg Oral QHS   [START ON 07/08/2024] furosemide   40 mg Intravenous Daily   Followed by   NOREEN ON 07/11/2024] furosemide   40 mg Oral Daily   guaiFENesin   600 mg Oral BID   insulin  aspart  0-9 Units Subcutaneous TID WC   insulin  glargine-yfgn  5 Units Subcutaneous Daily   levothyroxine   50 mcg Oral Q0600   lidocaine   1 patch Transdermal Q24H   lidocaine -EPINEPHrine  (PF)  30 mL Infiltration Once   liver oil-zinc  oxide   Topical BID   magic mouthwash w/lidocaine   5 mL Oral TID   multivitamin with minerals  1 tablet Oral Daily   neomycin -bacitracin -polymyxin   Apply externally Daily   nutrition supplement (JUVEN)  1 packet Oral BID BM   mouth rinse  15 mL Mouth Rinse 4 times per day   pantoprazole   40 mg Oral Daily   polyethylene glycol  17 g Oral BID    senna-docusate  1 tablet Oral QHS   sertraline   50 mg Oral Daily   sodium chloride  flush  10-40 mL Intracatheter Q12H   Tafamidis   1 capsule Oral Daily   vitamin A   10,000 Units Oral Daily   vutrisiran  sodium  25 mg Subcutaneous Q90 days   Continuous Infusions:  albumin  human     dextrose      PRN Meds:.acetaminophen , bismuth  subsalicylate, dextrose , food thickener, hydrOXYzine , lip balm, meclizine , ondansetron  **OR** ondansetron  (ZOFRAN ) IV, mouth rinse, artificial tears  Antimicrobials: Anti-infectives (From admission, onward)    Start     Dose/Rate Route Frequency Ordered Stop   04/27/24 1615  cefTAZidime  (FORTAZ ) 2 g in sodium chloride  0.9 % 100 mL IVPB        2 g 200 mL/hr over 30 Minutes Intravenous Every 8 hours 04/27/24 1518 05/03/24 2359   03/16/24 1015  cefTRIAXone  (ROCEPHIN ) 2 g in sodium  chloride 0.9 % 100 mL IVPB        2 g 200 mL/hr over 30 Minutes Intravenous Every 24 hours 03/16/24 0929 03/20/24 1219   03/16/24 1015  doxycycline  (VIBRA -TABS) tablet 100 mg        100 mg Oral Every 12 hours 03/16/24 0929 03/20/24 2131   03/14/24 1000  metroNIDAZOLE  (FLAGYL ) tablet 500 mg        500 mg Oral Every 12 hours 03/14/24 0803 03/19/24 2219   03/13/24 1000  cefadroxil  (DURICEF) capsule 1,000 mg  Status:  Discontinued        1,000 mg Oral 2 times daily 03/12/24 1632 03/16/24 0929   03/07/24 2200  metroNIDAZOLE  (FLAGYL ) tablet 500 mg        500 mg Oral Every 12 hours 03/07/24 0840 03/11/24 2258   03/05/24 1930  vancomycin  (VANCOREADY) IVPB 1250 mg/250 mL  Status:  Discontinued        1,250 mg 166.7 mL/hr over 90 Minutes Intravenous Every 24 hours 03/04/24 2205 03/04/24 2224   03/05/24 1930  Vancomycin  (VANCOCIN ) 1,250 mg in sodium chloride  0.9 % 250 mL IVPB  Status:  Discontinued        1,250 mg 166.7 mL/hr over 90 Minutes Intravenous Every 24 hours 03/04/24 2224 03/05/24 0717   03/05/24 1930  vancomycin  (VANCOREADY) IVPB 1500 mg/300 mL  Status:  Discontinued        1,500  mg 150 mL/hr over 120 Minutes Intravenous Every 24 hours 03/05/24 0717 03/12/24 1636   03/05/24 1400  ceFEPIme  (MAXIPIME ) 2 g in sodium chloride  0.9 % 100 mL IVPB  Status:  Discontinued        2 g 200 mL/hr over 30 Minutes Intravenous Every 8 hours 03/05/24 0717 03/12/24 1632   03/05/24 0600  ceFEPIme  (MAXIPIME ) 2 g in sodium chloride  0.9 % 100 mL IVPB  Status:  Discontinued        2 g 200 mL/hr over 30 Minutes Intravenous Every 12 hours 03/04/24 2205 03/05/24 0717   03/05/24 0000  metroNIDAZOLE  (FLAGYL ) IVPB 500 mg  Status:  Discontinued        500 mg 100 mL/hr over 60 Minutes Intravenous Every 12 hours 03/04/24 2206 03/04/24 2209   03/04/24 1730  aztreonam (AZACTAM) 2 g in sodium chloride  0.9 % 100 mL IVPB  Status:  Discontinued        2 g 200 mL/hr over 30 Minutes Intravenous  Once 03/04/24 1715 03/04/24 1724   03/04/24 1730  metroNIDAZOLE  (FLAGYL ) IVPB 500 mg  Status:  Discontinued        500 mg 100 mL/hr over 60 Minutes Intravenous Every 12 hours 03/04/24 1715 03/07/24 0840   03/04/24 1730  vancomycin  (VANCOREADY) IVPB 1500 mg/300 mL        1,500 mg 150 mL/hr over 120 Minutes Intravenous  Once 03/04/24 1715 03/04/24 2153   03/04/24 1730  ceFEPIme  (MAXIPIME ) 2 g in sodium chloride  0.9 % 100 mL IVPB        2 g 200 mL/hr over 30 Minutes Intravenous  Once 03/04/24 1724 03/04/24 1826        I have personally reviewed the following labs and images: CBC: Recent Labs  Lab 07/01/24 0414 07/03/24 0236  WBC 10.0 8.6  HGB 11.4* 12.2*  HCT 33.6* 35.8*  MCV 85.7 86.1  PLT 224 231    BMP &GFR Recent Labs  Lab 07/01/24 0414 07/02/24 0338 07/03/24 0236 07/03/24 0830 07/04/24 1056  NA 137 140 139  --  140  K 4.9 4.9 5.5* 5.2* 4.5  CL 104 105 106  --  106  CO2 24 25 22   --  26  GLUCOSE 88 93 157*  --  100*  BUN 47* 45* 46*  --  40*  CREATININE 1.22 1.25* 1.15  --  1.04  CALCIUM  9.0 9.0 8.7*  --  8.9  MG 1.9 2.1 2.1  --  2.0  PHOS 3.1  --   --   --   --     Estimated  Creatinine Clearance: 51.1 mL/min (by C-G formula based on SCr of 1.04 mg/dL). Liver & Pancreas: Recent Labs  Lab 07/01/24 0414 07/02/24 0338 07/03/24 0236 07/04/24 1056  AST 67* 90* 76* 66*  ALT 78* 85* 81* 71*  ALKPHOS 184* 197* 204* 195*  BILITOT 1.5* 1.6* 1.7* 1.6*  PROT 5.9* 5.6* 5.9* 5.8*  ALBUMIN  2.8* 2.7* 2.7* 2.6*   No results for input(s): LIPASE, AMYLASE in the last 168 hours. No results for input(s): AMMONIA in the last 168 hours. Diabetic: No results for input(s): HGBA1C in the last 72 hours. Recent Labs  Lab 07/04/24 0739 07/04/24 1359 07/04/24 1610 07/04/24 2108 07/05/24 0634  GLUCAP 124* 85 120* 143* 116*   Cardiac Enzymes: No results for input(s): CKTOTAL, CKMB, CKMBINDEX, TROPONINI in the last 168 hours. No results for input(s): PROBNP in the last 8760 hours. Coagulation Profile: No results for input(s): INR, PROTIME in the last 168 hours. Thyroid  Function Tests: No results for input(s): TSH, T4TOTAL, FREET4, T3FREE, THYROIDAB in the last 72 hours. Lipid Profile: No results for input(s): CHOL, HDL, LDLCALC, TRIG, CHOLHDL, LDLDIRECT in the last 72 hours. Anemia Panel: No results for input(s): VITAMINB12, FOLATE, FERRITIN, TIBC, IRON, RETICCTPCT in the last 72 hours. Urine analysis:    Component Value Date/Time   COLORURINE STRAW (A) 03/04/2024 2139   APPEARANCEUR CLEAR 03/04/2024 2139   LABSPEC 1.016 03/04/2024 2139   PHURINE 6.0 03/04/2024 2139   GLUCOSEU >=500 (A) 03/04/2024 2139   HGBUR SMALL (A) 03/04/2024 2139   BILIRUBINUR NEGATIVE 03/04/2024 2139   KETONESUR NEGATIVE 03/04/2024 2139   PROTEINUR 30 (A) 03/04/2024 2139   UROBILINOGEN 0.2 05/05/2015 1443   NITRITE NEGATIVE 03/04/2024 2139   LEUKOCYTESUR NEGATIVE 03/04/2024 2139   Sepsis Labs: Invalid input(s): PROCALCITONIN, LACTICIDVEN  Microbiology: No results found for this or any previous visit (from the past 240  hours).  Radiology Studies: NM Hepatobiliary Liver Func Result Date: 07/04/2024 CLINICAL DATA:  Cholecystitis.  Abnormal ultrasound EXAM: NUCLEAR MEDICINE HEPATOBILIARY IMAGING TECHNIQUE: Sequential images of the abdomen were obtained out to 60 minutes following intravenous administration of radiopharmaceutical. RADIOPHARMACEUTICALS:  5.4 mCi Tc-81m  Choletec  IV COMPARISON:  Ultrasound 07/01/2024 FINDINGS: Prompt uptake and biliary excretion of activity by the liver is seen. Gallbladder activity is visualized, consistent with patency of cystic duct. Biliary activity passes into small bowel, consistent with patent common bile duct. IMPRESSION: Patent common bile duct and cystic duct. No evidence acute cholecystitis Electronically Signed   By: Jackquline Boxer M.D.   On: 07/04/2024 16:47       Kostas Marrow T. Kamil Mchaffie [name]  If 7PM-7AM, please contact night-coverage [email or web address] 07/05/2024, 10:29 AM

## 2024-07-06 DIAGNOSIS — A419 Sepsis, unspecified organism: Secondary | ICD-10-CM | POA: Diagnosis not present

## 2024-07-06 DIAGNOSIS — I480 Paroxysmal atrial fibrillation: Secondary | ICD-10-CM | POA: Diagnosis not present

## 2024-07-06 DIAGNOSIS — Z8673 Personal history of transient ischemic attack (TIA), and cerebral infarction without residual deficits: Secondary | ICD-10-CM | POA: Diagnosis not present

## 2024-07-06 DIAGNOSIS — L98429 Non-pressure chronic ulcer of back with unspecified severity: Secondary | ICD-10-CM | POA: Diagnosis not present

## 2024-07-06 LAB — COMPREHENSIVE METABOLIC PANEL WITH GFR
ALT: 60 U/L — ABNORMAL HIGH (ref 0–44)
AST: 55 U/L — ABNORMAL HIGH (ref 15–41)
Albumin: 2.5 g/dL — ABNORMAL LOW (ref 3.5–5.0)
Alkaline Phosphatase: 200 U/L — ABNORMAL HIGH (ref 38–126)
Anion gap: 11 (ref 5–15)
BUN: 28 mg/dL — ABNORMAL HIGH (ref 8–23)
CO2: 23 mmol/L (ref 22–32)
Calcium: 8.7 mg/dL — ABNORMAL LOW (ref 8.9–10.3)
Chloride: 108 mmol/L (ref 98–111)
Creatinine, Ser: 0.88 mg/dL (ref 0.61–1.24)
GFR, Estimated: >60 mL/min (ref >60)
Glucose, Bld: 106 mg/dL — ABNORMAL HIGH (ref 70–99)
Potassium: 4.2 mmol/L (ref 3.5–5.1)
Sodium: 142 mmol/L (ref 135–145)
Total Bilirubin: 2.2 mg/dL — ABNORMAL HIGH (ref 0.0–1.2)
Total Protein: 5.8 g/dL — ABNORMAL LOW (ref 6.5–8.1)

## 2024-07-06 LAB — CBC
HCT: 40.2 % (ref 39.0–52.0)
Hemoglobin: 13.1 g/dL (ref 13.0–17.0)
MCH: 29.2 pg (ref 26.0–34.0)
MCHC: 32.6 g/dL (ref 30.0–36.0)
MCV: 89.5 fL (ref 80.0–100.0)
Platelets: 195 K/uL (ref 150–400)
RBC: 4.49 MIL/uL (ref 4.22–5.81)
RDW: 19.4 % — ABNORMAL HIGH (ref 11.5–15.5)
WBC: 4.4 K/uL (ref 4.0–10.5)
nRBC: 0 % (ref 0.0–0.2)

## 2024-07-06 LAB — GLUCOSE, CAPILLARY
Glucose-Capillary: 99 mg/dL (ref 70–99)
Glucose-Capillary: 175 mg/dL — ABNORMAL HIGH (ref 70–99)
Glucose-Capillary: 193 mg/dL — ABNORMAL HIGH (ref 70–99)
Glucose-Capillary: 150 mg/dL — ABNORMAL HIGH (ref 70–99)

## 2024-07-06 LAB — MAGNESIUM: Magnesium: 1.9 mg/dL (ref 1.7–2.4)

## 2024-07-06 MED ORDER — LORATADINE 10 MG PO TABS
10.0000 mg | ORAL_TABLET | Freq: Every day | ORAL | Status: DC
  Administered 2024-07-06 – 2024-08-21 (×47): 10 mg via ORAL
  Filled 2024-07-06 (×47): qty 1

## 2024-07-06 MED ORDER — FLUTICASONE PROPIONATE 50 MCG/ACT NA SUSP
1.0000 | Freq: Every day | NASAL | Status: DC
  Administered 2024-07-06 – 2024-08-21 (×47): 1 via NASAL
  Filled 2024-07-06: qty 16

## 2024-07-06 NOTE — Progress Notes (Signed)
 PROGRESS NOTE  Earl [name] FMW:[id number] DOB: Nov 26, 1940   PCP: [name], MD  Patient is from: SNF  DOA: 03/04/2024 LOS: 124  Chief complaints No chief complaint on file.    Brief Narrative / Interim history: 83 year old M with PMH of PAF on Eliquis , HFmrEF, amyloid cardiomyopathy, NIDDM-2, CVA with right-sided weakness, stage IV sacral wound, pancytopenia, MGUS, HTN, HLD and hypothyroidism brought to ED by EMS from [facility] rehab due to pain in sacral area and shortness of breath 4 days after he was discharged from the hospital.   Patient was hospitalized from 3/26-4/10 with generalized weakness, encephalopathy and COVID-19 infection.  Per daughter, patient sustained skin injury during this hospitalization, and he has had significant pain, discomfort and progressive foul-smelling discharge from sacral wound over the course of 4 to 5 days before coming back to ED.  No report of constitutional symptoms, chest pain or dyspnea.  In ED, hypotensive to 82/51.  In RVR to 140s.  Na 130.  Glucose elevated to 500.  Not in DKA or HHS.  WBC 13.6 with left shift.  CXR without acute finding.  CT abdomen and pelvis ordered.  Cultures obtained.  Admitted with working diagnosis of sepsis due to sacral pressure ulcer wound infection, A-fib with RVR, hypotension.  Cultures obtained.  Patient was started on broad-spectrum antibiotics and IV fluid.  PCCM, cardiology and general surgery consulted.  See plan and assessment for more on hospital course.  Subjective: Seen and examined earlier this morning.  No major events overnight or this morning.  Complains sore throat and bilateral ear pain.  Denies abdominal pain, nausea, vomiting, chest pain or shortness of breath.  Eating breakfast with the help of NT.  Objective: Vitals:   07/06/24 0015 07/06/24 0418 07/06/24 0500 07/06/24 0930  BP: 122/76 128/75  115/67  Pulse: 73 76  71  Resp: 16 16  18   Temp: (!) 97.2 F (36.2 C) (!) 97.3 F (36.3  C)  97.7 F (36.5 C)  TempSrc: Oral Oral    SpO2: 97% 95%  97%  Weight:   67.2 kg   Height:        Examination:  GENERAL: No apparent distress.  Appears frail. HEENT: MMM.  Postnasal drainage mainly on the left.  Vision and hearing grossly intact.  NECK: Supple.  No apparent JVD.  RESP: On room air.  Some WOB with talking. CVS:  RRR. Heart sounds normal.  ABD/GI/GU: BS+. Abd soft.  Nontender. MSK/EXT:  Moves extremities.  Ace wrap over BLE.  Dependent edema SKIN: Sacral, left hip, left shoulder and right heel ulcers.  See pictures under media. NEURO: Sleepy.  No apparent focal neurodeficit.  BLE weakness. PSYCH: Calm. Normal affect.   Consultants PCCM General Surgery Cardiology Infectious disease Plastic surgery Orthopedic surgery Palliative medicine  Microbiology 4/14-COVID-19 PCR positive.  Flu and RSV PCR nonreactive. 4/14-blood cultures NGTD 6/4-sacral wound culture with Pseudomonas aeruginosa and Klebsiella pneumoniae 7/14-pleural fluid culture NGTD   Assessment and plan: Severe sepsis due to sacral pressure wound infection: Present on arrival.  Presents with generalized weakness, poor appetite, hypotension pain and drainage from sacral wound.  Had leukocytosis, tachycardia, hypotension and lactic acidosis on presentation.    -Culture data as above. -4/14-CT showed sacral wound extension to the right gluteus muscle without signs of osteomyelitis. -4/15-underwent bedside debridement of sacral decubitus wound by general surgery -4/21-ID consulted and recommended continuing broad-spectrum antibiotics that he completed on 6/13. 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 -6/17- ID recommended continuing holding off antibiotics since he is HDS, afebrile without leukocytosis. -6/17-evaluated by plastic surgery.  Sacral wound felt to be clean.  Wound care recommended -7/13-7/13-CT showed similar appearance  of osteomyelitis underlying sacral decubitus ulcer.  -7/13-plastic surgery recommended for continued wound care and no surgical management. -7/14-Ortho evaluated patient for left hip wound.  Debulked and recommended optimizing nutrition -8/5- Surgery recommended hydrotherapy for left hip wound. Surgical debridement if not successful -8/5-wound care and instruction updated  -8/15-left hip wound worse with tunneling. Daughter concerned about this.  X-ray with left lateral hip soft tissue defect.  No acute bony abnormality.  CT done and results pending. -Overall, difficult case due to patient's nutritional status and limited mobility.  Prealbumin <5. -Tylenol  and oxycodone  as needed.      Chronic HFrEF/cardiac amyloidosis/NICM/hypotension: Hypotension resolved.  Recurrent pleural effusion and anasarca partly due to nutritional status and hypoalbuminemia.  Not on diuretics at home.  BNP elevated to 1900 (no prior value for comparison.  LFTs slightly up but improving.  Congestive hepatopathy?  Significant anasarca on exam.  Has poor appetite and early satiety with intermittent nausea and abdominal pain. -Started IV Lasix  40 mg daily with IV albumin  for 3 days on 8/15.  BP stable.  Cr  improved.  Will follow this with p.o. Lasix  -Continue Tafamidis  and vutrisiran . -Ace wrap for BLE edema -Strict intake and output, daily weights, renal functions and electrolytes   Recurrent bilateral pleural effusion/anasarca: Likely due to CHF, poor nutritional status/hypoalbuminemia.  Has some work of breathing as he speaks but saturating at 100% on RA.  Denies chest pain or SOB. -6/18-left-sided thoracentesis by IR.  Body fluid culture without any growth.   -7/14-bilateral thoracentesis by pulmonology.  Fluid transudative and culture negative -7/30-right thoracocentesis with removal of 500 mL of pleural fluid by IR.   -8/12-right thoracocentesis with removal of 800 cc fluid. -Diuretics as above. -Remains on room  air  Acute cholecystitis ruled out:  RUQ US  raise concern for cholecystitis without cholelithiasis.  However, HIDA scan negative for acute cholecystitis.  Ultrasound finding likely due to anasarca/fluid from third spacing. - Diuretics and IV albumin  as above  Elevated liver enzymes: Chronic but slightly trended up. Acute hepatitis panel was negative.  RUQ US  as above.  He has CHF with markedly elevated BNP raising concern for congestive hepatopathy.  LFT improved -Changed scheduled Tylenol  to as needed -Continue holding Lipitor. -May have to find alternative for amiodarone  if LFT continues to trend up -Continue monitoring   IDDM-2 with hyperglycemia : A1c 8.4% on 4/3. Recent Labs  Lab 07/05/24 0634 07/05/24 1300 07/05/24 1701 07/05/24 2014 07/06/24 0927  GLUCAP 116* 143* 143* 140* 99  -Continue SSI-sensitive -Continue Semglee  5 units daily  PAF/flutter with RVR: Rate controlled. -Continue amiodarone  and Eliquis  Eliquis  -May have to find alternative for amiodarone  if LFT continues to trend up -Cardiology signed off.   History of CVA: Stable -On Eliquis .  Holding Lipitor due to LFT.  Cough/sore throat/postnasal drainage -Start Flonase  and Claritin  -Continue Magic mouthwash with lidocaine    GERD: -Continue PPI   Anemia of chronic disease: Stable -Monitor intermittently   Hypothyroidism:  -Continue levothyroxine   Anxiety and adjustment disorder: -Psychiatry consulted on 7/7 and recommended continuing Zoloft  and Atarax .   Constipation: - Bowel regimen.  Hyperkalemia: Likely due to potassium supplementation: Resolved.  Goals of care: CODE STATUS DNR. 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  Palliative care  following.  Disposition: Per family, patient lives alone before prior hospitalization.  Family not interested in SNF or LTACH.  They also do not feel he is ready to be  discharged until his wounds are down to stage II.  Unfortunately, patient is bedbound with poor nutritional status which makes it difficult for the wound to heal or improve.  Discussed this with patient's daughter at bedside on 8/13.  Severe malnutrition Body mass index is 20.66 kg/m. Nutrition Problem: Severe Malnutrition Etiology: chronic illness Signs/Symptoms: energy intake < or equal to 75% for > or equal to 1 month, severe muscle depletion Interventions: Ensure Enlive (each supplement provides 350kcal and 20 grams of protein), Magic cup, Hormel Shake, MVI, Juven, Other (Comment) (vitamin C )  Multiple pressure skin injuries: Pressure Injury 02/26/24 Coccyx Mid Stage 4 - Full thickness tissue loss with exposed bone, tendon or muscle. (Active)  02/26/24 0916  Location: Coccyx  Location Orientation: Mid  Staging: Stage 4 - Full thickness tissue loss with exposed bone, tendon or muscle.  Wound Description (Comments):   DO NOT USE:  Present on Admission: Yes  Dressing Type Foam - Lift dressing to assess site every shift 07/05/24 1931     Pressure Injury 02/26/24 Toe (Comment  which one) Anterior;Right Deep Tissue Pressure Injury - Purple or maroon localized area of discolored intact skin or blood-filled blister due to damage of underlying soft tissue from pressure and/or shear. right gre (Active)  02/26/24 1215  Location: Toe (Comment  which one)  Location Orientation: Anterior;Right  Staging: Deep Tissue Pressure Injury - Purple or maroon localized area of discolored intact skin or blood-filled blister due to damage of underlying soft tissue from pressure and/or shear.  Wound Description (Comments): right great toe  DO NOT USE:  Present on Admission:   Dressing Type None 06/29/24 0615     Pressure Injury 03/06/24 Hip Anterior;Left;Proximal Unstageable - Full thickness tissue loss in which the base of the injury is covered by slough (yellow, tan, gray, green or brown) and/or eschar  (tan, brown or black) in the wound bed. unstageable (Active)  03/06/24 1612  Location: Hip  Location Orientation: Anterior;Left;Proximal  Staging: Unstageable - Full thickness tissue loss in which the base of the injury is covered by slough (yellow, tan, gray, green or brown) and/or eschar (tan, brown or black) in the wound bed.  Wound Description (Comments): unstageable  DO NOT USE:  Present on Admission: No  Dressing Type Gauze (Comment);Santyl  07/05/24 1931     Pressure Injury 03/06/24 Shoulder Left Unstageable - Full thickness tissue loss in which the base of the injury is covered by slough (yellow, tan, gray, green or brown) and/or eschar (tan, brown or black) in the wound bed. unstageable (Active)  03/06/24 1613  Location: Shoulder  Location Orientation: Left  Staging: Unstageable - Full thickness tissue loss in which the base of the injury is covered by slough (yellow, tan, gray, green or brown) and/or eschar (tan, brown or black) in the wound bed.  Wound Description (Comments): unstageable  DO NOT USE:  Present on Admission: No  Dressing Type Foam - Lift dressing to assess site every shift 07/05/24 1415     Pressure Injury 04/23/24 Heel Right Unstageable - Full thickness tissue loss in which the base of the injury is covered by slough (yellow, tan, gray, green or brown) and/or eschar (tan, brown or black) in the wound bed. DTPI evolving into unstageable (Active)  04/23/24   Location: Heel  Location Orientation: Right  Staging:  Unstageable - Full thickness tissue loss in which the base of the injury is covered by slough (yellow, tan, gray, green or brown) and/or eschar (tan, brown or black) in the wound bed.  Wound Description (Comments): DTPI evolving into unstageable  DO NOT USE:  Present on Admission: No  Dressing Type Other (Comment) (betadine\) 07/05/24 1415     Pressure Injury 04/23/24 Heel Left Deep Tissue Pressure Injury - Purple or maroon localized area of discolored intact  skin or blood-filled blister due to damage of underlying soft tissue from pressure and/or shear. (Active)  04/23/24   Location: Heel  Location Orientation: Left  Staging: Deep Tissue Pressure Injury - Purple or maroon localized area of discolored intact skin or blood-filled blister due to damage of underlying soft tissue from pressure and/or shear.  Wound Description (Comments):   DO NOT USE:  Present on Admission: No  Dressing Type Foam - Lift dressing to assess site every shift 07/05/24 0819   DVT prophylaxis:  SCDs Start: 03/04/24 2151 Place TED hose Start: 03/04/24 2151 apixaban  (ELIQUIS ) tablet 5 mg  Code Status: DNR Family Communication: None at bedside.  Level of care: Telemetry Medical Status is: Inpatient Remains inpatient appropriate because: Complicated disposition, pressure ulcer and failure to thrive   Final disposition: Yet to be determined   35 minutes with more than 50% spent in reviewing records, counseling patient/family and coordinating care.   Sch Meds:  Scheduled Meds:  (feeding supplement) PROSource Plus  30 mL Oral BID BM   amiodarone   200 mg Oral Daily   apixaban   5 mg Oral BID   calcium  carbonate  1 tablet Oral TID   collagenase    Topical Daily   diclofenac  Sodium  4 g Topical QID   feeding supplement  237 mL Oral BID BM   ferrous sulfate   325 mg Oral QHS   fluticasone   1 spray Each Nare Daily   [START ON 07/08/2024] furosemide   40 mg Intravenous Daily   Followed by   NOREEN ON 07/11/2024] furosemide   40 mg Oral Daily   guaiFENesin   600 mg Oral BID   insulin  aspart  0-9 Units Subcutaneous TID WC   insulin  glargine-yfgn  5 Units Subcutaneous Daily   levothyroxine   50 mcg Oral Q0600   lidocaine   1 patch Transdermal Q24H   lidocaine -EPINEPHrine  (PF)  30 mL Infiltration Once   liver oil-zinc  oxide   Topical BID   loratadine   10 mg Oral Daily   magic mouthwash w/lidocaine   5 mL Oral TID   multivitamin with minerals  1 tablet Oral Daily    neomycin -bacitracin -polymyxin   Apply externally Daily   nutrition supplement (JUVEN)  1 packet Oral BID BM   mouth rinse  15 mL Mouth Rinse 4 times per day   pantoprazole   40 mg Oral Daily   polyethylene glycol  17 g Oral BID   senna-docusate  1 tablet Oral QHS   sertraline   50 mg Oral Daily   sodium chloride  flush  10-40 mL Intracatheter Q12H   Tafamidis   1 capsule Oral Daily   vitamin A   10,000 Units Oral Daily   vutrisiran  sodium  25 mg Subcutaneous Q90 days   Continuous Infusions:  albumin  human 12.5 g (07/05/24 1126)   dextrose      PRN Meds:.acetaminophen , bismuth  subsalicylate, dextrose , food thickener, lip balm, meclizine , ondansetron  **OR** ondansetron  (ZOFRAN ) IV, mouth rinse, artificial tears  Antimicrobials: Anti-infectives (From admission, onward)    Start     Dose/Rate Route Frequency Ordered  Stop   04/27/24 1615  cefTAZidime  (FORTAZ ) 2 g in sodium chloride  0.9 % 100 mL IVPB        2 g 200 mL/hr over 30 Minutes Intravenous Every 8 hours 04/27/24 1518 05/03/24 2359   03/16/24 1015  cefTRIAXone  (ROCEPHIN ) 2 g in sodium chloride  0.9 % 100 mL IVPB        2 g 200 mL/hr over 30 Minutes Intravenous Every 24 hours 03/16/24 0929 03/20/24 1219   03/16/24 1015  doxycycline  (VIBRA -TABS) tablet 100 mg        100 mg Oral Every 12 hours 03/16/24 0929 03/20/24 2131   03/14/24 1000  metroNIDAZOLE  (FLAGYL ) tablet 500 mg        500 mg Oral Every 12 hours 03/14/24 0803 03/19/24 2219   03/13/24 1000  cefadroxil  (DURICEF) capsule 1,000 mg  Status:  Discontinued        1,000 mg Oral 2 times daily 03/12/24 1632 03/16/24 0929   03/07/24 2200  metroNIDAZOLE  (FLAGYL ) tablet 500 mg        500 mg Oral Every 12 hours 03/07/24 0840 03/11/24 2258   03/05/24 1930  vancomycin  (VANCOREADY) IVPB 1250 mg/250 mL  Status:  Discontinued        1,250 mg 166.7 mL/hr over 90 Minutes Intravenous Every 24 hours 03/04/24 2205 03/04/24 2224   03/05/24 1930  Vancomycin  (VANCOCIN ) 1,250 mg in sodium chloride  0.9  % 250 mL IVPB  Status:  Discontinued        1,250 mg 166.7 mL/hr over 90 Minutes Intravenous Every 24 hours 03/04/24 2224 03/05/24 0717   03/05/24 1930  vancomycin  (VANCOREADY) IVPB 1500 mg/300 mL  Status:  Discontinued        1,500 mg 150 mL/hr over 120 Minutes Intravenous Every 24 hours 03/05/24 0717 03/12/24 1636   03/05/24 1400  ceFEPIme  (MAXIPIME ) 2 g in sodium chloride  0.9 % 100 mL IVPB  Status:  Discontinued        2 g 200 mL/hr over 30 Minutes Intravenous Every 8 hours 03/05/24 0717 03/12/24 1632   03/05/24 0600  ceFEPIme  (MAXIPIME ) 2 g in sodium chloride  0.9 % 100 mL IVPB  Status:  Discontinued        2 g 200 mL/hr over 30 Minutes Intravenous Every 12 hours 03/04/24 2205 03/05/24 0717   03/05/24 0000  metroNIDAZOLE  (FLAGYL ) IVPB 500 mg  Status:  Discontinued        500 mg 100 mL/hr over 60 Minutes Intravenous Every 12 hours 03/04/24 2206 03/04/24 2209   03/04/24 1730  aztreonam (AZACTAM) 2 g in sodium chloride  0.9 % 100 mL IVPB  Status:  Discontinued        2 g 200 mL/hr over 30 Minutes Intravenous  Once 03/04/24 1715 03/04/24 1724   03/04/24 1730  metroNIDAZOLE  (FLAGYL ) IVPB 500 mg  Status:  Discontinued        500 mg 100 mL/hr over 60 Minutes Intravenous Every 12 hours 03/04/24 1715 03/07/24 0840   03/04/24 1730  vancomycin  (VANCOREADY) IVPB 1500 mg/300 mL        1,500 mg 150 mL/hr over 120 Minutes Intravenous  Once 03/04/24 1715 03/04/24 2153   03/04/24 1730  ceFEPIme  (MAXIPIME ) 2 g in sodium chloride  0.9 % 100 mL IVPB        2 g 200 mL/hr over 30 Minutes Intravenous  Once 03/04/24 1724 03/04/24 1826        I have personally reviewed the following labs and images: CBC: Recent Labs  Lab  07/01/24 0414 07/03/24 0236 07/06/24 0847  WBC 10.0 8.6 4.4  HGB 11.4* 12.2* 13.1  HCT 33.6* 35.8* 40.2  MCV 85.7 86.1 89.5  PLT 224 231 195    BMP &GFR Recent Labs  Lab 07/01/24 0414 07/02/24 0338 07/03/24 0236 07/03/24 0830 07/04/24 1056 07/06/24 0847  NA 137 140 139   --  140 142  K 4.9 4.9 5.5* 5.2* 4.5 4.2  CL 104 105 106  --  106 108  CO2 24 25 22   --  26 23  GLUCOSE 88 93 157*  --  100* 106*  BUN 47* 45* 46*  --  40* 28*  CREATININE 1.22 1.25* 1.15  --  1.04 0.88  CALCIUM  9.0 9.0 8.7*  --  8.9 8.7*  MG 1.9 2.1 2.1  --  2.0 1.9  PHOS 3.1  --   --   --   --   --     Estimated Creatinine Clearance: 60.5 mL/min (by C-G formula based on SCr of 0.88 mg/dL). Liver & Pancreas: Recent Labs  Lab 07/01/24 0414 07/02/24 0338 07/03/24 0236 07/04/24 1056 07/06/24 0847  AST 67* 90* 76* 66* 55*  ALT 78* 85* 81* 71* 60*  ALKPHOS 184* 197* 204* 195* 200*  BILITOT 1.5* 1.6* 1.7* 1.6* 2.2*  PROT 5.9* 5.6* 5.9* 5.8* 5.8*  ALBUMIN  2.8* 2.7* 2.7* 2.6* 2.5*   No results for input(s): LIPASE, AMYLASE in the last 168 hours. No results for input(s): AMMONIA in the last 168 hours. Diabetic: No results for input(s): HGBA1C in the last 72 hours. Recent Labs  Lab 07/05/24 0634 07/05/24 1300 07/05/24 1701 07/05/24 2014 07/06/24 0927  GLUCAP 116* 143* 143* 140* 99   Cardiac Enzymes: No results for input(s): CKTOTAL, CKMB, CKMBINDEX, TROPONINI in the last 168 hours. No results for input(s): PROBNP in the last 8760 hours. Coagulation Profile: No results for input(s): INR, PROTIME in the last 168 hours. Thyroid  Function Tests: No results for input(s): TSH, T4TOTAL, FREET4, T3FREE, THYROIDAB in the last 72 hours. Lipid Profile: No results for input(s): CHOL, HDL, LDLCALC, TRIG, CHOLHDL, LDLDIRECT in the last 72 hours. Anemia Panel: No results for input(s): VITAMINB12, FOLATE, FERRITIN, TIBC, IRON, RETICCTPCT in the last 72 hours. Urine analysis:    Component Value Date/Time   COLORURINE STRAW (A) 03/04/2024 2139   APPEARANCEUR CLEAR 03/04/2024 2139   LABSPEC 1.016 03/04/2024 2139   PHURINE 6.0 03/04/2024 2139   GLUCOSEU >=500 (A) 03/04/2024 2139   HGBUR SMALL (A) 03/04/2024 2139   BILIRUBINUR  NEGATIVE 03/04/2024 2139   KETONESUR NEGATIVE 03/04/2024 2139   PROTEINUR 30 (A) 03/04/2024 2139   UROBILINOGEN 0.2 05/05/2015 1443   NITRITE NEGATIVE 03/04/2024 2139   LEUKOCYTESUR NEGATIVE 03/04/2024 2139   Sepsis Labs: Invalid input(s): PROCALCITONIN, LACTICIDVEN  Microbiology: No results found for this or any previous visit (from the past 240 hours).  Radiology Studies: DG HIP PORT UNILAT WITH PELVIS 1V LEFT Result Date: 07/05/2024 CLINICAL DATA:  Open wound left hip. EXAM: DG HIP (WITH OR WITHOUT PELVIS) 1V PORT LEFT COMPARISON:  None Available. FINDINGS: Soft tissue defect noted laterally overlying the left hip. No underlying bony abnormality. No fracture, subluxation or dislocation. No bone destruction to suggest osteomyelitis. Hip joints and SI joints symmetric. IMPRESSION: Left lateral hip soft tissue defect.  No acute bony abnormality. Electronically Signed   By: Franky Crease M.D.   On: 07/05/2024 19:24       Kenidee Cregan T. Odetta Forness [name]  If 7PM-7AM, please contact night-coverage [email or web address]  07/06/2024, 11:31 AM

## 2024-07-06 NOTE — Plan of Care (Signed)
  Problem: Fluid Volume: Goal: Hemodynamic stability will improve Outcome: Progressing   Problem: Clinical Measurements: Goal: Diagnostic test results will improve Outcome: Progressing Goal: Signs and symptoms of infection will decrease Outcome: Progressing   Problem: Education: Goal: Knowledge of risk factors and measures for prevention of condition will improve Outcome: Progressing   Problem: Coping: Goal: Psychosocial and spiritual needs will be supported Outcome: Progressing   Problem: Respiratory: Goal: Will maintain a patent airway Outcome: Progressing Goal: Complications related to the disease process, condition or treatment will be avoided or minimized Outcome: Progressing   Problem: Education: Goal: Ability to describe self-care measures that may prevent or decrease complications (Diabetes Survival Skills Education) will improve Outcome: Progressing   Problem: Coping: Goal: Ability to adjust to condition or change in health will improve Outcome: Progressing   Problem: Fluid Volume: Goal: Ability to maintain a balanced intake and output will improve Outcome: Progressing   Problem: Health Behavior/Discharge Planning: Goal: Ability to manage health-related needs will improve Outcome: Progressing   Problem: Metabolic: Goal: Ability to maintain appropriate glucose levels will improve Outcome: Progressing   Problem: Nutritional: Goal: Maintenance of adequate nutrition will improve Outcome: Progressing Goal: Progress toward achieving an optimal weight will improve Outcome: Progressing   Problem: Skin Integrity: Goal: Risk for impaired skin integrity will decrease Outcome: Progressing   Problem: Tissue Perfusion: Goal: Adequacy of tissue perfusion will improve Outcome: Progressing   Problem: Education: Goal: Knowledge of General Education information will improve Description: Including pain rating scale, medication(s)/side effects and non-pharmacologic  comfort measures Outcome: Progressing   Problem: Health Behavior/Discharge Planning: Goal: Ability to manage health-related needs will improve Outcome: Progressing   Problem: Clinical Measurements: Goal: Ability to maintain clinical measurements within normal limits will improve Outcome: Progressing Goal: Will remain free from infection Outcome: Progressing Goal: Diagnostic test results will improve Outcome: Progressing Goal: Respiratory complications will improve Outcome: Progressing Goal: Cardiovascular complication will be avoided Outcome: Progressing   Problem: Activity: Goal: Risk for activity intolerance will decrease Outcome: Progressing   Problem: Nutrition: Goal: Adequate nutrition will be maintained Outcome: Progressing   Problem: Coping: Goal: Level of anxiety will decrease Outcome: Progressing   Problem: Elimination: Goal: Will not experience complications related to bowel motility Outcome: Progressing Goal: Will not experience complications related to urinary retention Outcome: Progressing   Problem: Pain Managment: Goal: General experience of comfort will improve and/or be controlled Outcome: Progressing   Problem: Safety: Goal: Ability to remain free from injury will improve Outcome: Progressing   Problem: Skin Integrity: Goal: Risk for impaired skin integrity will decrease Outcome: Progressing

## 2024-07-07 DIAGNOSIS — A419 Sepsis, unspecified organism: Secondary | ICD-10-CM | POA: Diagnosis not present

## 2024-07-07 DIAGNOSIS — R6520 Severe sepsis without septic shock: Secondary | ICD-10-CM | POA: Diagnosis not present

## 2024-07-07 LAB — GLUCOSE, CAPILLARY
Glucose-Capillary: 106 mg/dL — ABNORMAL HIGH (ref 70–99)
Glucose-Capillary: 115 mg/dL — ABNORMAL HIGH (ref 70–99)
Glucose-Capillary: 138 mg/dL — ABNORMAL HIGH (ref 70–99)
Glucose-Capillary: 167 mg/dL — ABNORMAL HIGH (ref 70–99)
Glucose-Capillary: 181 mg/dL — ABNORMAL HIGH (ref 70–99)

## 2024-07-07 MED ORDER — SODIUM CHLORIDE 3 % IN NEBU
4.0000 mL | INHALATION_SOLUTION | Freq: Two times a day (BID) | RESPIRATORY_TRACT | Status: AC
Start: 2024-07-07 — End: 2024-07-10
  Administered 2024-07-07 – 2024-07-09 (×5): 4 mL via RESPIRATORY_TRACT
  Filled 2024-07-07 (×6): qty 4

## 2024-07-07 MED ORDER — ORAL CARE MOUTH RINSE
15.0000 mL | OROMUCOSAL | Status: DC | PRN
Start: 2024-07-07 — End: 2024-08-22

## 2024-07-07 MED ORDER — ORAL CARE MOUTH RINSE
15.0000 mL | OROMUCOSAL | Status: DC
  Administered 2024-07-08 – 2024-08-21 (×173): 15 mL via OROMUCOSAL

## 2024-07-07 MED ORDER — ALBUMIN HUMAN 25 % IV SOLN
25.0000 g | Freq: Four times a day (QID) | INTRAVENOUS | Status: AC
  Administered 2024-07-07 (×2): 25 g via INTRAVENOUS
  Filled 2024-07-07 (×2): qty 100

## 2024-07-07 MED ORDER — OXYCODONE HCL 5 MG PO TABS
2.5000 mg | ORAL_TABLET | ORAL | Status: DC | PRN
  Administered 2024-07-17 – 2024-07-19 (×2): 2.5 mg via ORAL
  Filled 2024-07-07 (×5): qty 1

## 2024-07-07 NOTE — Progress Notes (Signed)
 PROGRESS NOTE  Earl [name] FMW:[id number] DOB: 16-Oct-1941 DOA: 03/04/2024 PCP: [name], MD   LOS: 125 days   Brief Narrative / Interim history: 83 year old M with PMH of PAF on Eliquis , HFmrEF, amyloid cardiomyopathy, NIDDM-2, CVA with right-sided weakness, stage IV sacral wound, pancytopenia, MGUS, HTN, HLD and hypothyroidism brought to ED by EMS from [facility] rehab due to pain in sacral area and shortness of breath 4 days after he was discharged from the hospital.  Patient was hospitalized from 3/26-4/10 with generalized weakness, encephalopathy and COVID-19 infection.  Per daughter, patient sustained skin injury during this hospitalization, and he has had significant pain, discomfort and progressive foul-smelling discharge from sacral wound over the course of 4 to 5 days before coming back to ED. he was admitted due to persistent wounds.  General surgery, orthopedic surgery, ID all consulted   See plan and assessment for more on hospital course.  Significant imaging / results / micro data: 4/14-COVID-19 PCR positive.  Flu and RSV PCR nonreactive. 4/14-blood cultures NGTD 6/4-sacral wound culture with Pseudomonas aeruginosa and Klebsiella pneumoniae 7/14-pleural fluid culture NGTD  Subjective / 24h Interval events: He is overall doing well this morning, has some congestion chest, denies any significant shortness of breath  Assesement and Plan: Principal Problem:   Severe sepsis (HCC) Active Problems:   Paroxysmal atrial fibrillation (HCC)   Sepsis (HCC)   Atrial flutter (HCC)   Sacral ulcer (HCC)   MGUS (monoclonal gammopathy of unknown significance)   Essential hypertension   Hyperlipidemia   Insulin  dependent type 2 diabetes mellitus (HCC)   History of CVA (cerebrovascular accident)   Cardiac amyloidosis (HCC)   Chronic systolic CHF (congestive heart failure) (HCC)   GERD (gastroesophageal reflux disease)   History of hypertension   Atrial fibrillation with  RVR (HCC)   Community acquired pneumonia   Protein-calorie malnutrition, severe   Wild-type transthyretin-related (ATTR) amyloidosis (HCC)   Sacral decubitus ulcer, stage IV (HCC)  Principal problem Stage IV sacral decubitus wound/ulcer with severe sepsis on admission  - Culture data as above. -4/14-CT showed sacral wound extension to the right gluteus muscle without signs of osteomyelitis. -4/15-underwent bedside debridement of sacral decubitus wound by general surgery -4/21-ID consulted and recommended continuing broad-spectrum antibiotics that he completed on 6/13. 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 -6/17- ID recommended continuing holding off antibiotics since he is HDS, afebrile without leukocytosis. -6/17-evaluated by plastic surgery.  Sacral wound felt to be clean.  Wound care recommended -7/13-7/13-CT showed similar appearance of osteomyelitis underlying sacral decubitus ulcer.  -7/13-plastic surgery recommended for continued wound care and no surgical management. -7/14-Ortho evaluated patient for left hip wound.  Debulked and recommended optimizing nutrition -8/5- Surgery recommended hydrotherapy for left hip wound. Surgical debridement if not successful -8/5-wound care and instruction updated  -8/15-left hip wound worse with tunneling. Daughter concerned about this.  X-ray with left lateral hip soft tissue defect.  No acute bony abnormality.  CT done results are still pending -Overall, difficult case due to patient's nutritional status and limited mobility.  Prealbumin <5. -Tylenol  and oxycodone  as needed.    Active problems Severe sepsis-on admission, patient had lactic acidosis, white count, fever secondary to infected chronic wound.  He has received multiple antibiotics during his long hospitalization.  Sepsis physiology has now resolved and he is currently afebrile, normotensive, not tachycardic.  White  count is normal, most recently 4.4  Acute on chronic combined CHF, cardiac amyloidosis, nonischemic cardiomyopathy, hypotension - on  tafamidis  and vutrisiran  for amyloid.  Goal-directed medical therapy somewhat limited by prior episodes of hypotension.   -Hypotension resolved.  Recurrent pleural effusion and anasarca partly due to nutritional status and hypoalbuminemia.  Not on diuretics at home.  BNP elevated to 1900 (no prior value for comparison.  LFTs slightly up but improving.  Congestive hepatopathy?  Significant anasarca on exam.  Has poor appetite and early satiety with intermittent nausea and abdominal pain. -Started IV Lasix  40 mg daily with IV albumin  for 3 days on 8/15.  BP stable.  Cr  improved.  Will follow this with p.o. Lasix  -Continue Tafamidis  and vutrisiran . -Ace wrap for BLE edema -Strict intake and output, daily weights, renal functions and electrolytes -Albumin  x 2 today  Elevated LFTs - Chronic but slightly trended up. Acute hepatitis panel was negative.  RUQ US  as above.  He has CHF with markedly elevated BNP raising concern for congestive hepatopathy.  LFT improved -Changed scheduled Tylenol  to as needed -Continue holding Lipitor. -May have to find alternative for amiodarone  if LFT continues to trend up -Continue monitoring  Acute on chronic pain-secondary to above, continue pain regimen with oxycodone  as below  PAF-continue amiodarone  and Eliquis    Anemia of chronic disease-monitor, no bleeding  History of CVA-stable, on Eliquis , holding Lipitor due to LFTs  Concern for acute cholecystitis-ruled out, right upper quadrant ultrasound initially raise concern, however HIDA scan was negative.  This is likely due to anasarca/third spacing.  Continue diuretics and IV albumin   Dysphagia/severe protein calorie malnutrition-continue supplementation, pured diet   Physical debilitation, muscle weakness-PT/OT ongoing   Goals of care -palliative consulted as well, appears  to be DNR.  Daughter wants him to transition at home once his wounds are better  Hypothyroidism -Continue levothyroxine    Anxiety and adjustment disorder -Psychiatry consulted on 7/7 and recommended continuing Zoloft  and Atarax .   IDDM -continue Semglee , sliding scale, Accu-Cheks  Disposition - Per family, patient lives alone before prior hospitalization.  Family not interested in SNF or LTACH.  They also do not feel he is ready to be discharged until his wounds are down to stage II.  Unfortunately, patient is bedbound with poor nutritional status which makes it difficult for the wound to heal or improve.  Discussed this with patient's daughter at bedside on 8/13.   Scheduled Meds:  (feeding supplement) PROSource Plus  30 mL Oral BID BM   amiodarone   200 mg Oral Daily   apixaban   5 mg Oral BID   calcium  carbonate  1 tablet Oral TID   collagenase    Topical Daily   diclofenac  Sodium  4 g Topical QID   feeding supplement  237 mL Oral BID BM   ferrous sulfate   325 mg Oral QHS   fluticasone   1 spray Each Nare Daily   [START ON 07/08/2024] furosemide   40 mg Intravenous Daily   Followed by   NOREEN ON 07/11/2024] furosemide   40 mg Oral Daily   guaiFENesin   600 mg Oral BID   insulin  aspart  0-9 Units Subcutaneous TID WC   insulin  glargine-yfgn  5 Units Subcutaneous Daily   levothyroxine   50 mcg Oral Q0600   lidocaine   1 patch Transdermal Q24H   lidocaine -EPINEPHrine  (PF)  30 mL Infiltration Once   liver oil-zinc  oxide   Topical BID   loratadine   10 mg Oral Daily   magic mouthwash w/lidocaine   5 mL Oral TID   multivitamin with minerals  1 tablet Oral Daily   neomycin -bacitracin -polymyxin   Apply  externally Daily   nutrition supplement (JUVEN)  1 packet Oral BID BM   mouth rinse  15 mL Mouth Rinse 4 times per day   pantoprazole   40 mg Oral Daily   polyethylene glycol  17 g Oral BID   senna-docusate  1 tablet Oral QHS   sertraline   50 mg Oral Daily   sodium chloride  flush  10-40 mL  Intracatheter Q12H   Tafamidis   1 capsule Oral Daily   vitamin A   10,000 Units Oral Daily   vutrisiran  sodium  25 mg Subcutaneous Q90 days   Continuous Infusions:  dextrose      PRN Meds:.acetaminophen , bismuth  subsalicylate, dextrose , food thickener, lip balm, meclizine , ondansetron  **OR** ondansetron  (ZOFRAN ) IV, mouth rinse, artificial tears  Current Outpatient Medications  Medication Instructions   Amino Acids -Protein Hydrolys (PRO-STAT AWC) LIQD 30 mLs, Oral, 2 times daily   AMVUTTRA  25 MG/0.5ML syringe INJECT 25MG  (0.5ML) SUBCUTANEOUSLY ONCE EVERY 3 MONTHS. ROTATE ADMINISTRATION SITE WITH EACH INJECTION. *REFRIGERATE. DO NOT SHAKE.*   ascorbic acid  (VITAMIN C ) 500 mg, Oral, Daily,     atorvastatin  (LIPITOR) 40 MG tablet Take  1 tablet  Daily for Cholesterol                                                       /                                  TAKE                                                       BY                                           MOUTH   B-12 50 mcg, Oral, Daily   Blood Glucose Monitoring Suppl (ONE TOUCH ULTRA 2) w/Device KIT Check blood sugar 1 time a day-DX-E11.9   Cholecalciferol  (VITAMIN D  PO) 1 tablet, Oral, Daily   ELIQUIS  5 MG TABS tablet TAKE 1 TABLET BY MOUTH TWICE  DAILY TO PREVENT BLOOD CLOTS   Emollient (AQUAPHOR ADV PROTECT HEALING) OINT Apply small amount to two areas on buttocks daily to prevent breakdown   ferrous sulfate  325 mg, Oral, Daily at bedtime   finasteride  (PROSCAR ) 5 MG tablet TAKE 1 TABLET BY MOUTH DAILY FOR PROSTATE   glucose blood (ONETOUCH ULTRA) test strip Use as instructed   Lancets (ONETOUCH DELICA PLUS LANCET30G) MISC CHECK BLOOD SUGAR ONCE A DAY.   Lancets Misc. (ONE TOUCH SURESOFT) MISC Check blood sugar 1 time a day-DX-E11.91   levothyroxine  (SYNTHROID ) 50 MCG tablet TAKE 1 TABLET BY MOUTH DAILY ON  AN EMPTY STOMACH WITH ONLY WATER FOR 30 MINUTES AND NO ANTACID,  MEDS, CALCIUM  OR MAGNESIUM  FOR 4 HOURS AND AVOID BIOTIN   Magnesium   250 mg, Oral, Daily   metFORMIN  (GLUCOPHAGE -XR) 500 MG 24 hr tablet TAKE 2 TABLETS BY MOUTH TWICE  DAILY WITH MEALS FOR DIABETES   metoprolol  succinate (TOPROL -XL) 25 mg, Oral,  Daily at bedtime   Multiple Vitamin (MULTIVITAMIN) capsule 1 capsule, Oral, Daily,     NONFORMULARY OR COMPOUNDED ITEM Please dispense a Rolling Walker.  Dx:R26.81 & G63   Nutritional Supplements (BOOST GLUCOSE CONTROL) LIQD 237 mLs, Oral, 2 times daily after meals   potassium chloride  SA (KLOR-CON  M20) 20 MEQ tablet 20 mEq, Oral, Daily   sacubitril -valsartan  (ENTRESTO ) 24-26 MG 1 tablet, Oral, 2 times daily   Tafamidis  (VYNDAMAX ) 61 MG CAPS Take 1 capsule by mouth daily.    Diet Orders (From admission, onward)     Start     Ordered   07/04/24 1403  DIET - DYS 1 Room service appropriate? Yes; Fluid consistency: Thin  Diet effective now       Question Answer Comment  Room service appropriate? Yes   Fluid consistency: Thin      07/04/24 1402            DVT prophylaxis: SCDs Start: 03/04/24 2151 Place TED hose Start: 03/04/24 2151 apixaban  (ELIQUIS ) tablet 5 mg   Lab Results  Component Value Date   PLT 195 07/06/2024      Code Status: Limited: Do not attempt resuscitation (DNR) -DNR-LIMITED -Do Not Intubate/DNI   Family Communication: No family at bedside, will talk to daughter later  Status is: Inpatient Remains inpatient appropriate because: Severity of illness   Level of care: Telemetry Medical  Consultants:  PCCM General Surgery Cardiology Infectious disease Plastic surgery Orthopedic surgery Palliative medicine  Objective: Vitals:   07/06/24 1947 07/06/24 2359 07/07/24 0500 07/07/24 0905  BP: 114/79 117/77  116/68  Pulse: 73 74  76  Resp: 16 16    Temp: 97.6 F (36.4 C) (!) 97.5 F (36.4 C)  98.4 F (36.9 C)  TempSrc: Oral Oral  Oral  SpO2: 99% 96%  97%  Weight:   70.8 kg   Height:        Intake/Output Summary (Last 24 hours) at 07/07/2024 1122 Last data filed at  07/07/2024 0542 Gross per 24 hour  Intake --  Output 1400 ml  Net -1400 ml   Wt Readings from Last 3 Encounters:  07/07/24 70.8 kg  02/14/24 72.1 kg  01/24/24 72.1 kg    Examination:  Constitutional: NAD Eyes: no scleral icterus ENMT: Mucous membranes are moist.  Neck: normal, supple Respiratory: clear to auscultation bilaterally, no wheezing, no crackles.  Cardiovascular: Regular rate and rhythm, no murmurs / rubs / gallops.  No lower extremity edema Abdomen: non distended, no tenderness. Bowel sounds positive.  Musculoskeletal: no clubbing / cyanosis.   Data Reviewed: I have independently reviewed following labs and imaging studies   CBC Recent Labs  Lab 07/01/24 0414 07/03/24 0236 07/06/24 0847  WBC 10.0 8.6 4.4  HGB 11.4* 12.2* 13.1  HCT 33.6* 35.8* 40.2  PLT 224 231 195  MCV 85.7 86.1 89.5  MCH 29.1 29.3 29.2  MCHC 33.9 34.1 32.6  RDW 19.1* 19.1* 19.4*    Recent Labs  Lab 07/01/24 0414 07/02/24 0338 07/03/24 0236 07/03/24 0830 07/04/24 1056 07/06/24 0847  NA 137 140 139  --  140 142  K 4.9 4.9 5.5* 5.2* 4.5 4.2  CL 104 105 106  --  106 108  CO2 24 25 22   --  26 23  GLUCOSE 88 93 157*  --  100* 106*  BUN 47* 45* 46*  --  40* 28*  CREATININE 1.22 1.25* 1.15  --  1.04 0.88  CALCIUM  9.0 9.0 8.7*  --  8.9  8.7*  AST 67* 90* 76*  --  66* 55*  ALT 78* 85* 81*  --  71* 60*  ALKPHOS 184* 197* 204*  --  195* 200*  BILITOT 1.5* 1.6* 1.7*  --  1.6* 2.2*  ALBUMIN  2.8* 2.7* 2.7*  --  2.6* 2.5*  MG 1.9 2.1 2.1  --  2.0 1.9  BNP 1,903.2*  --   --   --   --   --     ------------------------------------------------------------------------------------------------------------------ No results for input(s): CHOL, HDL, LDLCALC, TRIG, CHOLHDL, LDLDIRECT in the last 72 hours.  Lab Results  Component Value Date   HGBA1C 8.4 (H) 02/22/2024    ------------------------------------------------------------------------------------------------------------------ No results for input(s): TSH, T4TOTAL, T3FREE, THYROIDAB in the last 72 hours.  Invalid input(s): FREET3  Cardiac Enzymes No results for input(s): CKMB, TROPONINI, MYOGLOBIN in the last 168 hours.  Invalid input(s): CK ------------------------------------------------------------------------------------------------------------------    Component Value Date/Time   BNP 1,903.2 (H) 07/01/2024 0414   BNP 762 (H) 07/23/2021 0951    CBG: Recent Labs  Lab 07/06/24 1458 07/06/24 1857 07/06/24 2032 07/07/24 0651 07/07/24 0855  GLUCAP 175* 193* 150* 106* 115*    No results found for this or any previous visit (from the past 240 hours).   Radiology Studies: No results found.   Nilda Fendt, MD, PhD [facility]  Between 7 am - 7 pm I am available, please contact me via Amion (for emergencies) or Securechat (non urgent messages)  Between 7 pm - 7 am I am not available, please contact night coverage MD/APP via Amion

## 2024-07-07 NOTE — Plan of Care (Addendum)
 This patient remains on MC-2W a of time of writing. The patient has multiple non-healing wounds with daily dressing changes; assessed overnight. Secure chat sent to Dr. Charlton at 2142 hours: Good evening, reaching out RE this long-term patient with a concern for possible aspiration. Coarse lungs sounds R > L, difficulty clearing secretions, known dysphagia history. No change in VS. May be worth SLP at least? Doesn't look like they've ever seen him. Thanks in advance.... See new orders for SLP. 3% NaCl nebs initiated overnight per orders; patient tolerated well. Oral care protocol orders initiated overnight.   Problem: Fluid Volume: Goal: Hemodynamic stability will improve Outcome: Not Progressing   Problem: Clinical Measurements: Goal: Diagnostic test results will improve Outcome: Not Progressing Goal: Signs and symptoms of infection will decrease Outcome: Not Progressing   Problem: Education: Goal: Knowledge of risk factors and measures for prevention of condition will improve Outcome: Not Progressing   Problem: Coping: Goal: Psychosocial and spiritual needs will be supported Outcome: Not Progressing   Problem: Respiratory: Goal: Will maintain a patent airway Outcome: Not Progressing Goal: Complications related to the disease process, condition or treatment will be avoided or minimized Outcome: Not Progressing   Problem: Education: Goal: Ability to describe self-care measures that may prevent or decrease complications (Diabetes Survival Skills Education) will improve Outcome: Not Progressing   Problem: Coping: Goal: Ability to adjust to condition or change in health will improve Outcome: Not Progressing   Problem: Fluid Volume: Goal: Ability to maintain a balanced intake and output will improve Outcome: Not Progressing   Problem: Health Behavior/Discharge Planning: Goal: Ability to manage health-related needs will improve Outcome: Not Progressing   Problem:  Metabolic: Goal: Ability to maintain appropriate glucose levels will improve Outcome: Not Progressing   Problem: Nutritional: Goal: Maintenance of adequate nutrition will improve Outcome: Not Progressing Goal: Progress toward achieving an optimal weight will improve Outcome: Not Progressing   Problem: Skin Integrity: Goal: Risk for impaired skin integrity will decrease Outcome: Not Progressing   Problem: Tissue Perfusion: Goal: Adequacy of tissue perfusion will improve Outcome: Not Progressing   Problem: Education: Goal: Knowledge of General Education information will improve Description: Including pain rating scale, medication(s)/side effects and non-pharmacologic comfort measures Outcome: Not Progressing   Problem: Health Behavior/Discharge Planning: Goal: Ability to manage health-related needs will improve Outcome: Not Progressing   Problem: Clinical Measurements: Goal: Ability to maintain clinical measurements within normal limits will improve Outcome: Not Progressing Goal: Will remain free from infection Outcome: Not Progressing Goal: Diagnostic test results will improve Outcome: Not Progressing Goal: Respiratory complications will improve Outcome: Not Progressing Goal: Cardiovascular complication will be avoided Outcome: Not Progressing   Problem: Activity: Goal: Risk for activity intolerance will decrease Outcome: Not Progressing   Problem: Nutrition: Goal: Adequate nutrition will be maintained Outcome: Not Progressing   Problem: Coping: Goal: Level of anxiety will decrease Outcome: Not Progressing   Problem: Elimination: Goal: Will not experience complications related to bowel motility Outcome: Not Progressing Goal: Will not experience complications related to urinary retention Outcome: Not Progressing   Problem: Pain Managment: Goal: General experience of comfort will improve and/or be controlled Outcome: Not Progressing   Problem: Safety: Goal:  Ability to remain free from injury will improve Outcome: Not Progressing   Problem: Skin Integrity: Goal: Risk for impaired skin integrity will decrease Outcome: Not Progressing

## 2024-07-07 NOTE — Plan of Care (Signed)
  Problem: Fluid Volume: Goal: Hemodynamic stability will improve Outcome: Progressing   Problem: Clinical Measurements: Goal: Diagnostic test results will improve Outcome: Progressing Goal: Signs and symptoms of infection will decrease Outcome: Progressing   Problem: Education: Goal: Knowledge of risk factors and measures for prevention of condition will improve Outcome: Progressing   Problem: Coping: Goal: Psychosocial and spiritual needs will be supported Outcome: Progressing   Problem: Respiratory: Goal: Will maintain a patent airway Outcome: Progressing Goal: Complications related to the disease process, condition or treatment will be avoided or minimized Outcome: Progressing   Problem: Education: Goal: Ability to describe self-care measures that may prevent or decrease complications (Diabetes Survival Skills Education) will improve Outcome: Progressing   Problem: Coping: Goal: Ability to adjust to condition or change in health will improve Outcome: Progressing   Problem: Fluid Volume: Goal: Ability to maintain a balanced intake and output will improve Outcome: Progressing   Problem: Health Behavior/Discharge Planning: Goal: Ability to manage health-related needs will improve Outcome: Progressing   Problem: Metabolic: Goal: Ability to maintain appropriate glucose levels will improve Outcome: Progressing   Problem: Nutritional: Goal: Maintenance of adequate nutrition will improve Outcome: Progressing Goal: Progress toward achieving an optimal weight will improve Outcome: Progressing   Problem: Skin Integrity: Goal: Risk for impaired skin integrity will decrease Outcome: Progressing   Problem: Tissue Perfusion: Goal: Adequacy of tissue perfusion will improve Outcome: Progressing   Problem: Education: Goal: Knowledge of General Education information will improve Description: Including pain rating scale, medication(s)/side effects and non-pharmacologic  comfort measures Outcome: Progressing   Problem: Health Behavior/Discharge Planning: Goal: Ability to manage health-related needs will improve Outcome: Progressing   Problem: Clinical Measurements: Goal: Ability to maintain clinical measurements within normal limits will improve Outcome: Progressing Goal: Will remain free from infection Outcome: Progressing Goal: Diagnostic test results will improve Outcome: Progressing Goal: Respiratory complications will improve Outcome: Progressing Goal: Cardiovascular complication will be avoided Outcome: Progressing   Problem: Activity: Goal: Risk for activity intolerance will decrease Outcome: Progressing   Problem: Nutrition: Goal: Adequate nutrition will be maintained Outcome: Progressing   Problem: Coping: Goal: Level of anxiety will decrease Outcome: Progressing   Problem: Elimination: Goal: Will not experience complications related to bowel motility Outcome: Progressing Goal: Will not experience complications related to urinary retention Outcome: Progressing   Problem: Pain Managment: Goal: General experience of comfort will improve and/or be controlled Outcome: Progressing   Problem: Safety: Goal: Ability to remain free from injury will improve Outcome: Progressing   Problem: Skin Integrity: Goal: Risk for impaired skin integrity will decrease Outcome: Progressing

## 2024-07-07 NOTE — Plan of Care (Signed)
  Problem: Respiratory: Goal: Will maintain a patent airway Outcome: Progressing Goal: Complications related to the disease process, condition or treatment will be avoided or minimized Outcome: Progressing   Problem: Fluid Volume: Goal: Ability to maintain a balanced intake and output will improve Outcome: Progressing

## 2024-07-08 ENCOUNTER — Inpatient Hospital Stay (HOSPITAL_COMMUNITY)

## 2024-07-08 DIAGNOSIS — R6520 Severe sepsis without septic shock: Secondary | ICD-10-CM | POA: Diagnosis not present

## 2024-07-08 DIAGNOSIS — A419 Sepsis, unspecified organism: Secondary | ICD-10-CM | POA: Diagnosis not present

## 2024-07-08 LAB — CBC
HCT: 34.1 % — ABNORMAL LOW (ref 39.0–52.0)
Hemoglobin: 11.6 g/dL — ABNORMAL LOW (ref 13.0–17.0)
MCH: 29.6 pg (ref 26.0–34.0)
MCHC: 34 g/dL (ref 30.0–36.0)
MCV: 87 fL (ref 80.0–100.0)
Platelets: 206 K/uL (ref 150–400)
RBC: 3.92 MIL/uL — ABNORMAL LOW (ref 4.22–5.81)
RDW: 19.1 % — ABNORMAL HIGH (ref 11.5–15.5)
WBC: 6.4 K/uL (ref 4.0–10.5)
nRBC: 0 % (ref 0.0–0.2)

## 2024-07-08 LAB — COMPREHENSIVE METABOLIC PANEL WITH GFR
ALT: 44 U/L (ref 0–44)
AST: 46 U/L — ABNORMAL HIGH (ref 15–41)
Albumin: 3.1 g/dL — ABNORMAL LOW (ref 3.5–5.0)
Alkaline Phosphatase: 162 U/L — ABNORMAL HIGH (ref 38–126)
Anion gap: 10 (ref 5–15)
BUN: 27 mg/dL — ABNORMAL HIGH (ref 8–23)
CO2: 23 mmol/L (ref 22–32)
Calcium: 8.7 mg/dL — ABNORMAL LOW (ref 8.9–10.3)
Chloride: 107 mmol/L (ref 98–111)
Creatinine, Ser: 0.84 mg/dL (ref 0.61–1.24)
GFR, Estimated: >60 mL/min (ref >60)
Glucose, Bld: 131 mg/dL — ABNORMAL HIGH (ref 70–99)
Potassium: 4.3 mmol/L (ref 3.5–5.1)
Sodium: 140 mmol/L (ref 135–145)
Total Bilirubin: 2.5 mg/dL — ABNORMAL HIGH (ref 0.0–1.2)
Total Protein: 5.8 g/dL — ABNORMAL LOW (ref 6.5–8.1)

## 2024-07-08 LAB — GLUCOSE, CAPILLARY
Glucose-Capillary: 119 mg/dL — ABNORMAL HIGH (ref 70–99)
Glucose-Capillary: 181 mg/dL — ABNORMAL HIGH (ref 70–99)
Glucose-Capillary: 151 mg/dL — ABNORMAL HIGH (ref 70–99)
Glucose-Capillary: 143 mg/dL — ABNORMAL HIGH (ref 70–99)
Glucose-Capillary: 127 mg/dL — ABNORMAL HIGH (ref 70–99)

## 2024-07-08 LAB — MAGNESIUM: Magnesium: 2.1 mg/dL (ref 1.7–2.4)

## 2024-07-08 LAB — C-REACTIVE PROTEIN: CRP: 2.4 mg/dL — ABNORMAL HIGH (ref <1.0)

## 2024-07-08 LAB — PHOSPHORUS: Phosphorus: 2.1 mg/dL — ABNORMAL LOW (ref 2.5–4.6)

## 2024-07-08 MED ORDER — METRONIDAZOLE 500 MG PO TABS
500.0000 mg | ORAL_TABLET | Freq: Two times a day (BID) | ORAL | Status: AC
  Administered 2024-07-08 – 2024-07-12 (×10): 500 mg via ORAL
  Filled 2024-07-08 (×10): qty 1

## 2024-07-08 MED ORDER — SODIUM CHLORIDE 0.9 % IV SOLN
2.0000 g | INTRAVENOUS | Status: AC
  Administered 2024-07-08 – 2024-07-12 (×5): 2 g via INTRAVENOUS
  Filled 2024-07-08 (×5): qty 20

## 2024-07-08 NOTE — Progress Notes (Addendum)
 07/08/24 0745  SLP Visit Information  SLP Received On 07/08/24  Subjective  Subjective alert and communicative  General Information  HPI Earl [name] is an 83 y.o. 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 He was discharged from the hospital (admitted for acute metabolic encephalopathy, hypotensive, hypoglycemic) to [facility] for rehab. He presented back to the hospital four days later on 03/04/24 with SOB and pain in sacral area. 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 SLP previously saw pt to assess and manage concerns for dysphagia. He was D/Cd on a pureed diet with thin liquids. SLP re-consulted on 05/02/24 for voice assessment; signed off on 8/4 with recs for OP ENT if warranted. New swallowing consult ordered 8/18 due to new onset coughing after eating.  Type of Study Bedside Swallow Evaluation  Previous Swallow Assessment OP MBS February 2024; rec: dys 3, thin with chin tuck  Diet Prior to this Study Dysphagia 1 (pureed);Thin liquids (Level 0)  Temperature Spikes Noted No  Respiratory Status Room air  History of Recent Intubation No  Behavior/Cognition Alert;Cooperative  Oral Cavity Assessment WFL  Oral Care Completed by SLP Recent completion by staff  Self-Feeding Abilities Needs assist  Patient Positioning Upright in bed  Baseline Vocal Quality Low vocal intensity;Hoarse  Volitional Cough Wet;Congested  Volitional Swallow Able to elicit  Oral Assessment (Complete on admission/transfer/every shift)  Oral Assessment  (WDL) X  Lips Symmetrical  Teeth Missing (Comment)  Tongue Pink;Moist  Mucous Membrane(s) Moist;Pink  Saliva Moist, saliva free flowing  Level of Consciousness Alert  Is patient on any of following O2 devices?  None of the above  Nutritional status No high risk factors  Oral Assessment Risk  Low Risk  Oral Motor/Sensory Function  Overall Oral Motor/Sensory Function WFL  Ice Chips  Ice chips NT  Thin Liquid  Thin Liquid Impaired  Presentation Straw;Cup  Pharyngeal  Phase Impairments Cough - Delayed  Nectar Thick Liquid  Nectar Thick Liquid NT  Honey Thick Liquid  Honey Thick Liquid NT  Puree  Puree Impaired  Presentation Spoon  Pharyngeal Phase Impairments Cough - Delayed  Solid  Solid NT  SLP - End of Session  Patient left in bed;with call bell/phone within reach;with bed alarm set  Nurse Communication Treatment plan;Plan for instrumental testing  SLP Assessment  Clinical Impression Statement (ACUTE ONLY) Pt presents with new concerns for dysphagia with notable coughing after meals per RN.  Pt alert and interactive.  WIth assist from RN, [facility] was elevated and pt was repositioned to optimize safety.  He accepted sips of thin liquid and bites of applesauce, all of which elicited a delayed cough response.  Voice is hoarse, c/w baseline.  Oral manipulation/control was WNL.   Unable to discern at bedside if coughing is related to impaired pharyngeal swallow - recommend proceeding with MBS next date to assess swallow physiology. D/W RN.  Continue current diet cautiously; hold tray if coughing.  SLP Visit Diagnosis Dysphagia, unspecified (R13.10)  Other Related Risk Factors History of dysphagia;History of GERD;Previous CVA;Deconditioning  Swallow Evaluation Recommendations  SLP Diet Recommendations Thin;Dysphagia 1 (puree)  Liquid Administration via Cup;Straw  Medication Administration Crushed with puree  Supervision Staff to assist with self feeding  Compensations Small sips/bites  Postural Changes Seated upright at 90 degrees;Remain upright for at least 30  minutes after feeds/meals  Treatment Plan  Oral Care Recommendations Oral care BID  Treatment Recommendations F/U MBS in ___ days  (Comment);Defer until completion of intrumental exam  Individuals Consulted  Consulted and Agree with Results and Recommendations RN;MD  SLP Time Calculation  SLP Start Time (ACUTE ONLY) 1532  SLP Stop Time (ACUTE ONLY) 1545  SLP Time Calculation (min) (ACUTE ONLY) 13 min  SLP Evaluations  $ SLP Speech Visit 1 Visit  SLP Evaluations  $BSS Swallow 1 Procedure   Legend Tumminello L. Vona, MA CCC/SLP Clinical Specialist - Acute Care SLP Acute Rehabilitation Services Office number [phone]

## 2024-07-08 NOTE — Progress Notes (Signed)
 PROGRESS NOTE  Earl [name] FMW:[id number] DOB: 1941-05-03 DOA: 03/04/2024 PCP: [name], MD   LOS: 126 days   Brief Narrative / Interim history: 83 year old M with PMH of PAF on Eliquis , HFmrEF, amyloid cardiomyopathy, NIDDM-2, CVA with right-sided weakness, stage IV sacral wound, pancytopenia, MGUS, HTN, HLD and hypothyroidism brought to ED by EMS from [facility] rehab due to pain in sacral area and shortness of breath 4 days after he was discharged from the hospital.  Patient was hospitalized from 3/26-4/10 with generalized weakness, encephalopathy and COVID-19 infection.  Per daughter, patient sustained skin injury during this hospitalization, and he has had significant pain, discomfort and progressive foul-smelling discharge from sacral wound over the course of 4 to 5 days before coming back to ED. he was admitted due to persistent wounds.  General surgery, orthopedic surgery, ID all consulted   See plan and assessment for more on hospital course.  Significant imaging / results / micro data: 4/14-COVID-19 PCR positive.  Flu and RSV PCR nonreactive. 4/14-blood cultures NGTD 6/4-sacral wound culture with Pseudomonas aeruginosa and Klebsiella pneumoniae 7/14-pleural fluid culture NGTD  Subjective / 24h Interval events: Increasing gurgling overnight, increasing cough and increasing congestion  Assesement and Plan: Principal Problem:   Severe sepsis (HCC) Active Problems:   Paroxysmal atrial fibrillation (HCC)   Sepsis (HCC)   Atrial flutter (HCC)   Sacral ulcer (HCC)   MGUS (monoclonal gammopathy of unknown significance)   Essential hypertension   Hyperlipidemia   Insulin  dependent type 2 diabetes mellitus (HCC)   History of CVA (cerebrovascular accident)   Cardiac amyloidosis (HCC)   Chronic systolic CHF (congestive heart failure) (HCC)   GERD (gastroesophageal reflux disease)   History of hypertension   Atrial fibrillation with RVR (HCC)   Community acquired  pneumonia   Protein-calorie malnutrition, severe   Wild-type transthyretin-related (ATTR) amyloidosis (HCC)   Sacral decubitus ulcer, stage IV (HCC)  Principal problem Stage IV sacral decubitus wound/ulcer with severe sepsis on admission  - Culture data as above. -4/14-CT showed sacral wound extension to the right gluteus muscle without signs of osteomyelitis. -4/15-underwent bedside debridement of sacral decubitus wound by general surgery -4/21-ID consulted and recommended continuing broad-spectrum antibiotics that he completed on 6/13. 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 -6/17- ID recommended continuing holding off antibiotics since he is HDS, afebrile without leukocytosis. -6/17-evaluated by plastic surgery.  Sacral wound felt to be clean.  Wound care recommended -7/13-7/13-CT showed similar appearance of osteomyelitis underlying sacral decubitus ulcer.  -7/13-plastic surgery recommended for continued wound care and no surgical management. -7/14-Ortho evaluated patient for left hip wound.  Debulked and recommended optimizing nutrition -8/5- Surgery recommended hydrotherapy for left hip wound. Surgical debridement if not successful -8/5-wound care and instruction updated  -8/15-left hip wound worse with tunneling. Daughter concerned about this.  X-ray with left lateral hip soft tissue defect.  No acute bony abnormality.  CT done results are still pending -Overall, difficult case due to patient's nutritional status and limited mobility.  Prealbumin <5. -Tylenol  and oxycodone  as needed.   - CT scan of the hip 8/15-read still pending - The pads on top of the heel wound are not sticking well due to dry and flaky skin.  Discussed with the bedside RN to put the foam pads and then wrap the foot and ankle to prevent them from sliding  Active problems Severe sepsis-on admission, patient had lactic acidosis, white count, fever  secondary to infected chronic wound.  He has  received multiple antibiotics during his long hospitalization.  Sepsis physiology has now resolved and he is currently afebrile, normotensive, not tachycardic.  White count is normal, most recently 4.4  Aspiration pneumonia-patient with worsening congestion, increased cough.  Chest x-ray this morning with lower lobes collapsed, likely due to being in bed for prolonged amount of time.  With worsening symptoms favor doing a 5-day course of Unasyn  Acute on chronic combined CHF, cardiac amyloidosis, nonischemic cardiomyopathy, hypotension - on tafamidis  and vutrisiran  for amyloid.  Goal-directed medical therapy somewhat limited by prior episodes of hypotension.   -Hypotension resolved.  Recurrent pleural effusion and anasarca partly due to nutritional status and hypoalbuminemia.  Not on diuretics at home.  BNP elevated to 1900 (no prior value for comparison.  LFTs slightly up but improving.  Congestive hepatopathy?  Significant anasarca on exam.  Has poor appetite and early satiety with intermittent nausea and abdominal pain. -Started IV Lasix  40 mg daily with IV albumin  for 3 days on 8/15.  BP stable.  Cr  improved.  Currently on p.o. Lasix  -Continue Tafamidis  and vutrisiran . -Ace wrap for BLE edema -Strict intake and output, daily weights, renal functions and electrolytes - Hold off administering albumin  as it has improved  Elevated LFTs - Chronic but slightly trended up. Acute hepatitis panel was negative.  RUQ US  as above.  He has CHF with markedly elevated BNP raising concern for congestive hepatopathy.  LFT improved -Changed scheduled Tylenol  to as needed -Continue holding Lipitor. -May have to find alternative for amiodarone  if LFT continues to trend up - LFTs now improving and trending down  Acute on chronic pain-secondary to above, continue pain regimen with oxycodone  as below  PAF-continue amiodarone  and Eliquis .  No bleeding   Anemia of  chronic disease-monitor, no bleeding, hemoglobin 11 this morning  History of CVA-stable, on Eliquis , holding Lipitor due to LFTs  Concern for acute cholecystitis-ruled out, right upper quadrant ultrasound initially raise concern, however HIDA scan was negative.  This is likely due to anasarca/third spacing.  Continue diuretics  Dysphagia/severe protein calorie malnutrition-continue supplementation, pured diet   Physical debilitation, muscle weakness-PT/OT ongoing   Goals of care -palliative consulted as well, appears to be DNR.  Daughter wants him to transition at home once his wounds are better.  I discussed with daughter 8/17 that I highly doubt he will improve, he may have passed the point of no return.    Hypothyroidism -Continue levothyroxine    Anxiety and adjustment disorder -Psychiatry consulted on 7/7 and recommended continuing Zoloft  and Atarax .   IDDM -continue Semglee , sliding scale, Accu-Cheks   Scheduled Meds:  (feeding supplement) PROSource Plus  30 mL Oral BID BM   amiodarone   200 mg Oral Daily   apixaban   5 mg Oral BID   calcium  carbonate  1 tablet Oral TID   collagenase    Topical Daily   diclofenac  Sodium  4 g Topical QID   feeding supplement  237 mL Oral BID BM   ferrous sulfate   325 mg Oral QHS   fluticasone   1 spray Each Nare Daily   furosemide   40 mg Intravenous Daily   Followed by   NOREEN ON 07/11/2024] furosemide   40 mg Oral Daily   guaiFENesin   600 mg Oral BID   insulin  aspart  0-9 Units Subcutaneous TID WC   insulin  glargine-yfgn  5 Units Subcutaneous Daily   levothyroxine   50 mcg Oral Q0600   lidocaine   1 patch Transdermal Q24H   lidocaine -EPINEPHrine  (PF)  30 mL  Infiltration Once   liver oil-zinc  oxide   Topical BID   loratadine   10 mg Oral Daily   magic mouthwash w/lidocaine   5 mL Oral TID   multivitamin with minerals  1 tablet Oral Daily   neomycin -bacitracin -polymyxin   Apply externally Daily   nutrition supplement (JUVEN)  1 packet Oral BID  BM   mouth rinse  15 mL Mouth Rinse 4 times per day   pantoprazole   40 mg Oral Daily   polyethylene glycol  17 g Oral BID   senna-docusate  1 tablet Oral QHS   sertraline   50 mg Oral Daily   sodium chloride  flush  10-40 mL Intracatheter Q12H   sodium chloride  HYPERTONIC  4 mL Nebulization BID   Tafamidis   1 capsule Oral Daily   vitamin A   10,000 Units Oral Daily   vutrisiran  sodium  25 mg Subcutaneous Q90 days   Continuous Infusions:  dextrose      PRN Meds:.acetaminophen , bismuth  subsalicylate, dextrose , food thickener, lip balm, meclizine , ondansetron  **OR** ondansetron  (ZOFRAN ) IV, mouth rinse, oxyCODONE , artificial tears  Current Outpatient Medications  Medication Instructions   Amino Acids -Protein Hydrolys (PRO-STAT AWC) LIQD 30 mLs, Oral, 2 times daily   AMVUTTRA  25 MG/0.5ML syringe INJECT 25MG  (0.5ML) SUBCUTANEOUSLY ONCE EVERY 3 MONTHS. ROTATE ADMINISTRATION SITE WITH EACH INJECTION. *REFRIGERATE. DO NOT SHAKE.*   ascorbic acid  (VITAMIN C ) 500 mg, Oral, Daily,     atorvastatin  (LIPITOR) 40 MG tablet Take  1 tablet  Daily for Cholesterol                                                       /                                  TAKE                                                       BY                                           MOUTH   B-12 50 mcg, Oral, Daily   Blood Glucose Monitoring Suppl (ONE TOUCH ULTRA 2) w/Device KIT Check blood sugar 1 time a day-DX-E11.9   Cholecalciferol  (VITAMIN D  PO) 1 tablet, Oral, Daily   ELIQUIS  5 MG TABS tablet TAKE 1 TABLET BY MOUTH TWICE  DAILY TO PREVENT BLOOD CLOTS   Emollient (AQUAPHOR ADV PROTECT HEALING) OINT Apply small amount to two areas on buttocks daily to prevent breakdown   ferrous sulfate  325 mg, Oral, Daily at bedtime   finasteride  (PROSCAR ) 5 MG tablet TAKE 1 TABLET BY MOUTH DAILY FOR PROSTATE   glucose blood (ONETOUCH ULTRA) test strip Use as instructed   Lancets (ONETOUCH DELICA PLUS LANCET30G) MISC CHECK BLOOD SUGAR ONCE A DAY.    Lancets Misc. (ONE TOUCH SURESOFT) MISC Check blood sugar 1 time a day-DX-E11.91   levothyroxine  (SYNTHROID ) 50 MCG tablet TAKE 1 TABLET BY MOUTH DAILY ON  AN EMPTY STOMACH  WITH ONLY WATER FOR 30 MINUTES AND NO ANTACID,  MEDS, CALCIUM  OR MAGNESIUM  FOR 4 HOURS AND AVOID BIOTIN   Magnesium  250 mg, Oral, Daily   metFORMIN  (GLUCOPHAGE -XR) 500 MG 24 hr tablet TAKE 2 TABLETS BY MOUTH TWICE  DAILY WITH MEALS FOR DIABETES   metoprolol  succinate (TOPROL -XL) 25 mg, Oral, Daily at bedtime   Multiple Vitamin (MULTIVITAMIN) capsule 1 capsule, Oral, Daily,     NONFORMULARY OR COMPOUNDED ITEM Please dispense a Rolling Walker.  Dx:R26.81 & G63   Nutritional Supplements (BOOST GLUCOSE CONTROL) LIQD 237 mLs, Oral, 2 times daily after meals   potassium chloride  SA (KLOR-CON  M20) 20 MEQ tablet 20 mEq, Oral, Daily   sacubitril -valsartan  (ENTRESTO ) 24-26 MG 1 tablet, Oral, 2 times daily   Tafamidis  (VYNDAMAX ) 61 MG CAPS Take 1 capsule by mouth daily.    Diet Orders (From admission, onward)     Start     Ordered   07/04/24 1403  DIET - DYS 1 Room service appropriate? Yes; Fluid consistency: Thin  Diet effective now       Question Answer Comment  Room service appropriate? Yes   Fluid consistency: Thin      07/04/24 1402            DVT prophylaxis: SCDs Start: 03/04/24 2151 Place TED hose Start: 03/04/24 2151 apixaban  (ELIQUIS ) tablet 5 mg   Lab Results  Component Value Date   PLT 206 07/08/2024      Code Status: Limited: Do not attempt resuscitation (DNR) -DNR-LIMITED -Do Not Intubate/DNI   Family Communication: No family at bedside, will talk to daughter later  Status is: Inpatient Remains inpatient appropriate because: Severity of illness   Level of care: Telemetry Medical  Consultants:  PCCM General Surgery Cardiology Infectious disease Plastic surgery Orthopedic surgery Palliative medicine  Objective: Vitals:   07/08/24 0417 07/08/24 0600 07/08/24 0748 07/08/24 0841  BP:  121/72  116/78   Pulse: 73  77 77  Resp: 16 19 18 18   Temp: 97.6 F (36.4 C)  97.7 F (36.5 C)   TempSrc: Oral  Oral   SpO2: 95%  100% 100%  Weight:  71.7 kg    Height:        Intake/Output Summary (Last 24 hours) at 07/08/2024 9090 Last data filed at 07/08/2024 0600 Gross per 24 hour  Intake 335.5 ml  Output 650 ml  Net -314.5 ml   Wt Readings from Last 3 Encounters:  07/08/24 71.7 kg  02/14/24 72.1 kg  01/24/24 72.1 kg    Examination:  Constitutional: NAD Eyes: lids and conjunctivae normal, no scleral icterus ENMT: mmm Neck: normal, supple Respiratory: Rhonchorous breath sounds bilateral lung fields.  No wheezing Cardiovascular: Regular rate and rhythm, no murmurs / rubs / gallops.  Abdomen: soft, no distention, no tenderness. Bowel sounds positive.   Data Reviewed: I have independently reviewed following labs and imaging studies   CBC Recent Labs  Lab 07/03/24 0236 07/06/24 0847 07/08/24 0641  WBC 8.6 4.4 6.4  HGB 12.2* 13.1 11.6*  HCT 35.8* 40.2 34.1*  PLT 231 195 206  MCV 86.1 89.5 87.0  MCH 29.3 29.2 29.6  MCHC 34.1 32.6 34.0  RDW 19.1* 19.4* 19.1*    Recent Labs  Lab 07/02/24 0338 07/03/24 0236 07/03/24 0830 07/04/24 1056 07/06/24 0847 07/08/24 0641  NA 140 139  --  140 142 140  K 4.9 5.5* 5.2* 4.5 4.2 4.3  CL 105 106  --  106 108 107  CO2 25 22  --  26 23 23   GLUCOSE 93 157*  --  100* 106* 131*  BUN 45* 46*  --  40* 28* 27*  CREATININE 1.25* 1.15  --  1.04 0.88 0.84  CALCIUM  9.0 8.7*  --  8.9 8.7* 8.7*  AST 90* 76*  --  66* 55* 46*  ALT 85* 81*  --  71* 60* 44  ALKPHOS 197* 204*  --  195* 200* 162*  BILITOT 1.6* 1.7*  --  1.6* 2.2* 2.5*  ALBUMIN  2.7* 2.7*  --  2.6* 2.5* 3.1*  MG 2.1 2.1  --  2.0 1.9 2.1  CRP  --   --   --   --   --  2.4*    ------------------------------------------------------------------------------------------------------------------ No results for input(s): CHOL, HDL, LDLCALC, TRIG, CHOLHDL,  LDLDIRECT in the last 72 hours.  Lab Results  Component Value Date   HGBA1C 8.4 (H) 02/22/2024   ------------------------------------------------------------------------------------------------------------------ No results for input(s): TSH, T4TOTAL, T3FREE, THYROIDAB in the last 72 hours.  Invalid input(s): FREET3  Cardiac Enzymes No results for input(s): CKMB, TROPONINI, MYOGLOBIN in the last 168 hours.  Invalid input(s): CK ------------------------------------------------------------------------------------------------------------------    Component Value Date/Time   BNP 1,903.2 (H) 07/01/2024 0414   BNP 762 (H) 07/23/2021 0951    CBG: Recent Labs  Lab 07/07/24 0855 07/07/24 1154 07/07/24 1711 07/07/24 1948 07/08/24 0750  GLUCAP 115* 138* 167* 181* 119*    No results found for this or any previous visit (from the past 240 hours).   Radiology Studies: DG CHEST PORT 1 VIEW Result Date: 07/08/2024 CLINICAL DATA:  Cough. EXAM: PORTABLE CHEST 1 VIEW COMPARISON:  07/02/2024 and CT chest 03/04/2024. FINDINGS: Patient is rotated. Trachea is midline. Heart is enlarged, stable. Bilateral lower lobe collapse/consolidation with layering small to moderate bilateral pleural effusions. IMPRESSION: 1. Bilateral lower lobe collapse/consolidation. Difficult to exclude pneumonia. 2. Layering small to moderate bilateral pleural effusions. Electronically Signed   By: Newell Eke M.D.   On: 07/08/2024 08:20     Nilda Fendt, MD, PhD [facility]  Between 7 am - 7 pm I am available, please contact me via Amion (for emergencies) or Securechat (non urgent messages)  Between 7 pm - 7 am I am not available, please contact night coverage MD/APP via Amion

## 2024-07-08 NOTE — Plan of Care (Signed)
  Problem: Tissue Perfusion: Goal: Adequacy of tissue perfusion will improve Outcome: Progressing   Problem: Skin Integrity: Goal: Risk for impaired skin integrity will decrease Outcome: Progressing   Problem: Clinical Measurements: Goal: Ability to maintain clinical measurements within normal limits will improve Outcome: Progressing Goal: Will remain free from infection Outcome: Progressing Goal: Diagnostic test results will improve Outcome: Progressing Goal: Respiratory complications will improve Outcome: Progressing Goal: Cardiovascular complication will be avoided Outcome: Progressing   Problem: Activity: Goal: Risk for activity intolerance will decrease Outcome: Progressing

## 2024-07-09 ENCOUNTER — Inpatient Hospital Stay (HOSPITAL_COMMUNITY)

## 2024-07-09 DIAGNOSIS — R6520 Severe sepsis without septic shock: Secondary | ICD-10-CM | POA: Diagnosis not present

## 2024-07-09 DIAGNOSIS — A419 Sepsis, unspecified organism: Secondary | ICD-10-CM | POA: Diagnosis not present

## 2024-07-09 LAB — CBC
HCT: 33.5 % — ABNORMAL LOW (ref 39.0–52.0)
Hemoglobin: 11.6 g/dL — ABNORMAL LOW (ref 13.0–17.0)
MCH: 29.5 pg (ref 26.0–34.0)
MCHC: 34.6 g/dL (ref 30.0–36.0)
MCV: 85.2 fL (ref 80.0–100.0)
Platelets: 187 K/uL (ref 150–400)
RBC: 3.93 MIL/uL — ABNORMAL LOW (ref 4.22–5.81)
RDW: 19.1 % — ABNORMAL HIGH (ref 11.5–15.5)
WBC: 6.5 K/uL (ref 4.0–10.5)
nRBC: 0 % (ref 0.0–0.2)

## 2024-07-09 LAB — MAGNESIUM: Magnesium: 1.9 mg/dL (ref 1.7–2.4)

## 2024-07-09 LAB — COMPREHENSIVE METABOLIC PANEL WITH GFR
ALT: 43 U/L (ref 0–44)
AST: 51 U/L — ABNORMAL HIGH (ref 15–41)
Albumin: 2.7 g/dL — ABNORMAL LOW (ref 3.5–5.0)
Alkaline Phosphatase: 152 U/L — ABNORMAL HIGH (ref 38–126)
Anion gap: 10 (ref 5–15)
BUN: 28 mg/dL — ABNORMAL HIGH (ref 8–23)
CO2: 26 mmol/L (ref 22–32)
Calcium: 8.6 mg/dL — ABNORMAL LOW (ref 8.9–10.3)
Chloride: 105 mmol/L (ref 98–111)
Creatinine, Ser: 0.87 mg/dL (ref 0.61–1.24)
GFR, Estimated: >60 mL/min (ref >60)
Glucose, Bld: 91 mg/dL (ref 70–99)
Potassium: 3.7 mmol/L (ref 3.5–5.1)
Sodium: 141 mmol/L (ref 135–145)
Total Bilirubin: 2.1 mg/dL — ABNORMAL HIGH (ref 0.0–1.2)
Total Protein: 5.5 g/dL — ABNORMAL LOW (ref 6.5–8.1)

## 2024-07-09 LAB — GLUCOSE, CAPILLARY
Glucose-Capillary: 86 mg/dL (ref 70–99)
Glucose-Capillary: 96 mg/dL (ref 70–99)
Glucose-Capillary: 175 mg/dL — ABNORMAL HIGH (ref 70–99)
Glucose-Capillary: 192 mg/dL — ABNORMAL HIGH (ref 70–99)

## 2024-07-09 LAB — PHOSPHORUS: Phosphorus: 2.3 mg/dL — ABNORMAL LOW (ref 2.5–4.6)

## 2024-07-09 MED ORDER — FOOD THICKENER (SIMPLYTHICK)
10.0000 | Freq: Once | ORAL | Status: AC
  Administered 2024-07-09: 10 via ORAL

## 2024-07-09 MED ORDER — ALBUMIN HUMAN 25 % IV SOLN
25.0000 g | Freq: Four times a day (QID) | INTRAVENOUS | Status: AC
  Administered 2024-07-09 (×2): 25 g via INTRAVENOUS
  Filled 2024-07-09 (×2): qty 100

## 2024-07-09 NOTE — Plan of Care (Signed)
  Problem: Education: Goal: Knowledge of risk factors and measures for prevention of condition will improve Outcome: Progressing   Problem: Respiratory: Goal: Will maintain a patent airway Outcome: Progressing Goal: Complications related to the disease process, condition or treatment will be avoided or minimized Outcome: Progressing   Problem: Fluid Volume: Goal: Ability to maintain a balanced intake and output will improve Outcome: Progressing   Problem: Nutritional: Goal: Maintenance of adequate nutrition will improve Outcome: Progressing Goal: Progress toward achieving an optimal weight will improve Outcome: Progressing

## 2024-07-09 NOTE — Plan of Care (Signed)
   Medical records reviewed including progress notes, labs, imaging. Currently being treated for aspiration pneumonia. MBS done today with recommendations for dysphagia 1/nectar thick liquids while recovering from pneumonia. Patient remains high risk for additional complications.  Goals of care remain clear for continued medical interventions and hospital is the preferred setting for this care. Hope is for transition to home with outpatient palliative care. Patient is satisfied with current symptom management plan.   PMT will continue to follow peripherally. No other acute needs at this time.   Lokelani Lutes, PA-C Palliative Medicine Team  Team Phone # [phone]   NO CHARGE

## 2024-07-09 NOTE — Evaluation (Signed)
 Modified Barium Swallow Study  Patient Details  Name: Earl [name] MRN: [id number] Date of Birth: 05/23/1941  Today's Date: 07/09/2024  Modified Barium Swallow completed.  Full report located under Chart Review in the Imaging Section.  History of Present Illness Earl [name] is an 83 y.o. 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 He was discharged from the hospital (admitted for acute metabolic encephalopathy, hypotensive, hypoglycemic) to [facility] for rehab. He presented back to the hospital four days later on 03/04/24 with SOB and pain in sacral area. 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 SLP previously saw pt to assess and manage concerns for dysphagia. He was D/Cd on a pureed diet with thin liquids. SLP re-consulted on 05/02/24 for voice assessment; signed off on 8/4 with recs for OP ENT if warranted. New swallowing consult ordered 8/18 due to new onset coughing after eating.   Clinical Impression Pt presents with a mild pharyngeal dysphagia characterized by mistiming of the pharyngeal swallow, with thin liquids intermittently spilling into the larynx just before airway closure.  Aspiration was trace, inconsistent, and usually occurred with large boluses.  Chin tuck, straws had no impact on swallow physiology. All other aspects of the swallow were WFL, including pharyngeal strength (no residue post-swallow).  Esophageal sweep revealed clearance of barium.  Pt swallowed a 13 mm barium pill whole without difficulty. Recommend thickening liquids to nectar while pt is recovering from pna.  Spoke with his daughter, Earl [name], re: results and plan; this info was communicated to RN and Dr. Trixie as well.  REC: dysphagia 1, nectar thick liquids. SLP  will follow.   Factors that may increase risk of adverse event in presence of aspiration Noe & Lianne 2021): Limited mobility;Frail or deconditioned;Poor general health and/or compromised immunity  Swallow Evaluation Recommendations Recommendations: PO diet PO Diet Recommendation: Dysphagia 1 (Pureed);Mildly thick liquids (Level 2, nectar thick) Liquid Administration via: Cup;Straw Medication Administration: Whole meds with puree Supervision: Staff to assist with self-feeding Postural changes: Position pt fully upright for meals Oral care recommendations: Oral care BID (2x/day)    Kesi Perrow L. Vona, MA CCC/SLP Clinical Specialist - Acute Care SLP Acute Rehabilitation Services Office number [phone]   Vona Palma Laurice 07/09/2024,10:11 AM

## 2024-07-09 NOTE — Progress Notes (Addendum)
 PROGRESS NOTE  Earl [name] FMW:[id number] DOB: 06/09/1941 DOA: 03/04/2024 PCP: [name], MD   LOS: 127 days   Brief Narrative / Interim history: 83 year old M with PMH of PAF on Eliquis , HFmrEF, amyloid cardiomyopathy, NIDDM-2, CVA with right-sided weakness, stage IV sacral wound, pancytopenia, MGUS, HTN, HLD and hypothyroidism brought to ED by EMS from [facility] rehab due to pain in sacral area and shortness of breath 4 days after he was discharged from the hospital.  Patient was hospitalized from 3/26-4/10 with generalized weakness, encephalopathy and COVID-19 infection.  Per daughter, patient sustained skin injury during this hospitalization, and he has had significant pain, discomfort and progressive foul-smelling discharge from sacral wound over the course of 4 to 5 days before coming back to ED. he was admitted due to persistent wounds.  General surgery, orthopedic surgery, ID all consulted   See plan and assessment for more on hospital course.  Significant imaging / results / micro data: 4/14-COVID-19 PCR positive.  Flu and RSV PCR nonreactive. 4/14-blood cultures NGTD 6/4-sacral wound culture with Pseudomonas aeruginosa and Klebsiella pneumoniae 7/14-pleural fluid culture NGTD  Subjective / 24h Interval events: He tells me he is feeling better this morning, his cough has decreased and his breathing is comfortable.  Denies any chest pain, no abdominal discomfort, no nausea or vomiting  Assesement and Plan: Principal Problem:   Severe sepsis (HCC) Active Problems:   Paroxysmal atrial fibrillation (HCC)   Sepsis (HCC)   Atrial flutter (HCC)   Sacral ulcer (HCC)   MGUS (monoclonal gammopathy of unknown significance)   Essential hypertension   Hyperlipidemia   Insulin  dependent type 2 diabetes mellitus (HCC)   History of CVA (cerebrovascular accident)   Cardiac amyloidosis (HCC)   Chronic systolic CHF (congestive heart failure) (HCC)   GERD (gastroesophageal  reflux disease)   History of hypertension   Atrial fibrillation with RVR (HCC)   Community acquired pneumonia   Protein-calorie malnutrition, severe   Wild-type transthyretin-related (ATTR) amyloidosis (HCC)   Sacral decubitus ulcer, stage IV (HCC)  Principal problem Stage IV sacral decubitus wound/ulcer with severe sepsis on admission  - Culture data as above. -4/14-CT showed sacral wound extension to the right gluteus muscle without signs of osteomyelitis. -4/15-underwent bedside debridement of sacral decubitus wound by general surgery -4/21-ID consulted and recommended continuing broad-spectrum antibiotics that he completed on 6/13. 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 -6/17- ID recommended continuing holding off antibiotics since he is HDS, afebrile without leukocytosis. -6/17-evaluated by plastic surgery.  Sacral wound felt to be clean.  Wound care recommended -7/13-7/13-CT showed similar appearance of osteomyelitis underlying sacral decubitus ulcer.  -7/13-plastic surgery recommended for continued wound care and no surgical management. -7/14-Ortho evaluated patient for left hip wound.  Debulked and recommended optimizing nutrition -8/5- Surgery recommended hydrotherapy for left hip wound. Surgical debridement if not successful -8/5-wound care and instruction updated  -8/15-left hip wound worse with tunneling. Daughter concerned about this.  X-ray with left lateral hip soft tissue defect.  No acute bony abnormality.  CT done results are still pending -Overall, difficult case due to patient's nutritional status and limited mobility.  Prealbumin <5. -Tylenol  and oxycodone  as needed.   - CT scan of the hip 8/15 with new left hip wound which extends deep, 2.8 cm without evidence of acute osteolysis involving the underlying left greater trochanter. - The pads on top of the heel wound are not sticking well due to dry and  flaky skin.  Discussed with  the bedside RN to put the foam pads and then wrap the foot and ankle to prevent them from sliding  Active problems Severe sepsis-on admission, patient had lactic acidosis, white count, fever secondary to infected chronic wound.  He has received multiple antibiotics during his long hospitalization.  Sepsis physiology has now resolved and he is currently afebrile, normotensive, not tachycardic.  White count is now normal  Aspiration pneumonia-patient with worsening congestion, increased cough on 8/18.  Chest x-ray 8/18 with lower lobes collapsed and possible underlying pneumonia, likely due to being in bed for prolonged amount of time.  With worsening symptoms favor doing a 5-day course of antibiotics with ceftriaxone  and metronidazole  (he is pen allergic).  Today is day 2/5 - Appears that the swallowing has changed recently, speech consulted again will obtain an MBS today  Acute on chronic combined CHF, cardiac amyloidosis, nonischemic cardiomyopathy, hypotension - on tafamidis  and vutrisiran  for amyloid.  Goal-directed medical therapy somewhat limited by prior episodes of hypotension.   - Hypotension resolved.  Recurrent pleural effusion and anasarca partly due to nutritional status and hypoalbuminemia.  Not on diuretics at home.  BNP elevated to 1900 (no prior value for comparison.  LFTs slightly up but improving.  Congestive hepatopathy?  Significant anasarca on exam.  Has poor appetite and early satiety with intermittent nausea and abdominal pain. -Continue IV Lasix  for 2 more days then transition to p.o. on 8/21.  Has received albumin , will do 2 more doses today -Continue Tafamidis  and vutrisiran . -Ace wrap for BLE edema -Strict intake and output, daily weights, renal functions and electrolytes - Hold off administering albumin  as it has improved  Elevated LFTs - Chronic but slightly trended up. Acute hepatitis panel was negative.  RUQ US  as above.  He has CHF with  markedly elevated BNP raising concern for congestive hepatopathy.  LFT improved -Changed scheduled Tylenol  to as needed -Continue holding Lipitor. -May have to find alternative for amiodarone  if LFT continues to trend up - LFTs now improving and trending down  Acute on chronic pain-secondary to above, continue pain regimen with oxycodone  as below  PAF-continue amiodarone  and Eliquis .  No bleeding   Anemia of chronic disease-monitor, no bleeding, hemoglobin 11 this morning  History of CVA-stable, on Eliquis , holding Lipitor due to LFTs  Concern for acute cholecystitis-ruled out, right upper quadrant ultrasound initially raise concern, however HIDA scan was negative.  This is likely due to anasarca/third spacing.  Continue diuretics  Dysphagia/severe protein calorie malnutrition-continue supplementation, pured diet   Physical debilitation, muscle weakness-PT/OT ongoing   Goals of care -palliative consulted as well, appears to be DNR.  Daughter wants him to transition at home once his wounds are better.  I discussed with daughter 8/17 that I highly doubt he will improve, he may have passed the point of no return.    Hypothyroidism -Continue levothyroxine    Anxiety and adjustment disorder -Psychiatry consulted on 7/7 and recommended continuing Zoloft  and Atarax .   IDDM -continue Semglee , sliding scale, Accu-Cheks   Scheduled Meds:  (feeding supplement) PROSource Plus  30 mL Oral BID BM   amiodarone   200 mg Oral Daily   apixaban   5 mg Oral BID   calcium  carbonate  1 tablet Oral TID   collagenase    Topical Daily   diclofenac  Sodium  4 g Topical QID   feeding supplement  237 mL Oral BID BM   ferrous sulfate   325 mg Oral QHS   fluticasone   1 spray Each Nare Daily  furosemide   40 mg Intravenous Daily   Followed by   NOREEN ON 07/11/2024] furosemide   40 mg Oral Daily   guaiFENesin   600 mg Oral BID   insulin  aspart  0-9 Units Subcutaneous TID WC   insulin  glargine-yfgn  5 Units  Subcutaneous Daily   levothyroxine   50 mcg Oral Q0600   lidocaine   1 patch Transdermal Q24H   lidocaine -EPINEPHrine  (PF)  30 mL Infiltration Once   liver oil-zinc  oxide   Topical BID   loratadine   10 mg Oral Daily   magic mouthwash w/lidocaine   5 mL Oral TID   metroNIDAZOLE   500 mg Oral Q12H   multivitamin with minerals  1 tablet Oral Daily   neomycin -bacitracin -polymyxin   Apply externally Daily   nutrition supplement (JUVEN)  1 packet Oral BID BM   mouth rinse  15 mL Mouth Rinse 4 times per day   pantoprazole   40 mg Oral Daily   polyethylene glycol  17 g Oral BID   senna-docusate  1 tablet Oral QHS   sertraline   50 mg Oral Daily   sodium chloride  flush  10-40 mL Intracatheter Q12H   sodium chloride  HYPERTONIC  4 mL Nebulization BID   Tafamidis   1 capsule Oral Daily   vitamin A   10,000 Units Oral Daily   vutrisiran  sodium  25 mg Subcutaneous Q90 days   Continuous Infusions:  cefTRIAXone  (ROCEPHIN )  IV Stopped (07/08/24 1157)   dextrose      PRN Meds:.acetaminophen , bismuth  subsalicylate, dextrose , food thickener, lip balm, meclizine , ondansetron  **OR** ondansetron  (ZOFRAN ) IV, mouth rinse, oxyCODONE , artificial tears  Current Outpatient Medications  Medication Instructions   Amino Acids -Protein Hydrolys (PRO-STAT AWC) LIQD 30 mLs, Oral, 2 times daily   AMVUTTRA  25 MG/0.5ML syringe INJECT 25MG  (0.5ML) SUBCUTANEOUSLY ONCE EVERY 3 MONTHS. ROTATE ADMINISTRATION SITE WITH EACH INJECTION. *REFRIGERATE. DO NOT SHAKE.*   ascorbic acid  (VITAMIN C ) 500 mg, Oral, Daily,     atorvastatin  (LIPITOR) 40 MG tablet Take  1 tablet  Daily for Cholesterol                                                       /                                  TAKE                                                       BY                                           MOUTH   B-12 50 mcg, Oral, Daily   Blood Glucose Monitoring Suppl (ONE TOUCH ULTRA 2) w/Device KIT Check blood sugar 1 time a day-DX-E11.9   Cholecalciferol   (VITAMIN D  PO) 1 tablet, Oral, Daily   ELIQUIS  5 MG TABS tablet TAKE 1 TABLET BY MOUTH TWICE  DAILY TO PREVENT BLOOD CLOTS   Emollient (AQUAPHOR ADV PROTECT HEALING) OINT Apply small amount to two areas on  buttocks daily to prevent breakdown   ferrous sulfate  325 mg, Oral, Daily at bedtime   finasteride  (PROSCAR ) 5 MG tablet TAKE 1 TABLET BY MOUTH DAILY FOR PROSTATE   glucose blood (ONETOUCH ULTRA) test strip Use as instructed   Lancets (ONETOUCH DELICA PLUS LANCET30G) MISC CHECK BLOOD SUGAR ONCE A DAY.   Lancets Misc. (ONE TOUCH SURESOFT) MISC Check blood sugar 1 time a day-DX-E11.91   levothyroxine  (SYNTHROID ) 50 MCG tablet TAKE 1 TABLET BY MOUTH DAILY ON  AN EMPTY STOMACH WITH ONLY WATER FOR 30 MINUTES AND NO ANTACID,  MEDS, CALCIUM  OR MAGNESIUM  FOR 4 HOURS AND AVOID BIOTIN   Magnesium  250 mg, Oral, Daily   metFORMIN  (GLUCOPHAGE -XR) 500 MG 24 hr tablet TAKE 2 TABLETS BY MOUTH TWICE  DAILY WITH MEALS FOR DIABETES   metoprolol  succinate (TOPROL -XL) 25 mg, Oral, Daily at bedtime   Multiple Vitamin (MULTIVITAMIN) capsule 1 capsule, Oral, Daily,     NONFORMULARY OR COMPOUNDED ITEM Please dispense a Rolling Walker.  Dx:R26.81 & G63   Nutritional Supplements (BOOST GLUCOSE CONTROL) LIQD 237 mLs, Oral, 2 times daily after meals   potassium chloride  SA (KLOR-CON  M20) 20 MEQ tablet 20 mEq, Oral, Daily   sacubitril -valsartan  (ENTRESTO ) 24-26 MG 1 tablet, Oral, 2 times daily   Tafamidis  (VYNDAMAX ) 61 MG CAPS Take 1 capsule by mouth daily.    Diet Orders (From admission, onward)     Start     Ordered   07/04/24 1403  DIET - DYS 1 Room service appropriate? Yes; Fluid consistency: Thin  Diet effective now       Question Answer Comment  Room service appropriate? Yes   Fluid consistency: Thin      07/04/24 1402            DVT prophylaxis: SCDs Start: 03/04/24 2151 Place TED hose Start: 03/04/24 2151 apixaban  (ELIQUIS ) tablet 5 mg   Lab Results  Component Value Date   PLT 187 07/09/2024       Code Status: Limited: Do not attempt resuscitation (DNR) -DNR-LIMITED -Do Not Intubate/DNI   Family Communication: No family at bedside, will talk to daughter later  Status is: Inpatient Remains inpatient appropriate because: Severity of illness   Level of care: Telemetry Medical  Consultants:  PCCM General Surgery Cardiology Infectious disease Plastic surgery Orthopedic surgery Palliative medicine  Objective: Vitals:   07/08/24 2116 07/09/24 0015 07/09/24 0320 07/09/24 0436  BP:  105/63  113/74  Pulse: 75 60  74  Resp: (!) 24     Temp:  97.7 F (36.5 C)  98 F (36.7 C)  TempSrc:  Oral  Oral  SpO2: 95% 98%  100%  Weight:   75.3 kg   Height:        Intake/Output Summary (Last 24 hours) at 07/09/2024 0650 Last data filed at 07/08/2024 1950 Gross per 24 hour  Intake 100 ml  Output --  Net 100 ml   Wt Readings from Last 3 Encounters:  07/09/24 75.3 kg  02/14/24 72.1 kg  01/24/24 72.1 kg    Examination:  Constitutional: NAD Eyes: lids and conjunctivae normal, no scleral icterus ENMT: mmm Neck: normal, supple Respiratory: clear to auscultation bilaterally, no wheezing, no crackles.  Cardiovascular: Regular rate and rhythm, no murmurs / rubs / gallops. Abdomen: soft, no distention, no tenderness. Bowel sounds positive.  Skin: no rashes  Data Reviewed: I have independently reviewed following labs and imaging studies   CBC Recent Labs  Lab 07/03/24 0236 07/06/24 9152 07/08/24 9358 07/09/24 9566  WBC 8.6 4.4 6.4 6.5  HGB 12.2* 13.1 11.6* 11.6*  HCT 35.8* 40.2 34.1* 33.5*  PLT 231 195 206 187  MCV 86.1 89.5 87.0 85.2  MCH 29.3 29.2 29.6 29.5  MCHC 34.1 32.6 34.0 34.6  RDW 19.1* 19.4* 19.1* 19.1*    Recent Labs  Lab 07/03/24 0236 07/03/24 0830 07/04/24 1056 07/06/24 0847 07/08/24 0641 07/09/24 0433  NA 139  --  140 142 140 141  K 5.5* 5.2* 4.5 4.2 4.3 3.7  CL 106  --  106 108 107 105  CO2 22  --  26 23 23 26   GLUCOSE 157*  --  100*  106* 131* 91  BUN 46*  --  40* 28* 27* 28*  CREATININE 1.15  --  1.04 0.88 0.84 0.87  CALCIUM  8.7*  --  8.9 8.7* 8.7* 8.6*  AST 76*  --  66* 55* 46* 51*  ALT 81*  --  71* 60* 44 43  ALKPHOS 204*  --  195* 200* 162* 152*  BILITOT 1.7*  --  1.6* 2.2* 2.5* 2.1*  ALBUMIN  2.7*  --  2.6* 2.5* 3.1* 2.7*  MG 2.1  --  2.0 1.9 2.1 1.9  CRP  --   --   --   --  2.4*  --     ------------------------------------------------------------------------------------------------------------------ No results for input(s): CHOL, HDL, LDLCALC, TRIG, CHOLHDL, LDLDIRECT in the last 72 hours.  Lab Results  Component Value Date   HGBA1C 8.4 (H) 02/22/2024   ------------------------------------------------------------------------------------------------------------------ No results for input(s): TSH, T4TOTAL, T3FREE, THYROIDAB in the last 72 hours.  Invalid input(s): FREET3  Cardiac Enzymes No results for input(s): CKMB, TROPONINI, MYOGLOBIN in the last 168 hours.  Invalid input(s): CK ------------------------------------------------------------------------------------------------------------------    Component Value Date/Time   BNP 1,903.2 (H) 07/01/2024 0414   BNP 762 (H) 07/23/2021 0951    CBG: Recent Labs  Lab 07/08/24 1105 07/08/24 1228 07/08/24 1642 07/08/24 2144 07/09/24 0627  GLUCAP 181* 151* 143* 127* 86    No results found for this or any previous visit (from the past 240 hours).   Radiology Studies: No results found.    Nilda Fendt, MD, PhD [facility]  Between 7 am - 7 pm I am available, please contact me via Amion (for emergencies) or Securechat (non urgent messages)  Between 7 pm - 7 am I am not available, please contact night coverage MD/APP via Amion

## 2024-07-09 NOTE — Progress Notes (Signed)
 Physical Therapy Wound Treatment Patient Details  Name: Earl [name] MRN: [id number] Date of Birth: [birth year]  Today's Date: 07/09/2024 Time: 8571-8472 Time Calculation (min): 59 min  Subjective  Subjective Assessment Subjective: Dr Trixie in room inquires about placement of wound vac. PT explained that there is not enough viable tissure for it to promote healing Patient and Family Stated Goals: Heal wounds Date of Onset:  (unknown) Prior Treatments: Dressing changes, off weighting  Pain Score:    Wound Assessment  Pressure Injury 03/06/24 Hip Anterior;Left;Proximal Unstageable - Full thickness tissue loss in which the base of the injury is covered by slough (yellow, tan, gray, green or brown) and/or eschar (tan, brown or black) in the wound bed. unstageable (Active)  Wound Image   07/02/24 1400  Dressing Type Foam - Lift dressing to assess site every shift;Gauze (Comment);Barrier Film (skin prep);Santyl ;Normal saline moist dressing 07/09/24 1600  Dressing Clean, Dry, Intact 07/09/24 1600  Dressing Change Frequency Daily 07/09/24 1600  State of Healing Non-healing 07/09/24 1600  Site / Wound Assessment Yellow;Friable;Brown;Dusky;Pink 07/09/24 1600  % Wound base Red or Granulating 0% 07/09/24 1600  % Wound base Yellow/Fibrinous Exudate 85% 07/09/24 1600  % Wound base Black/Eschar 15% 07/09/24 1600  % Wound base Other/Granulation Tissue (Comment) 0% 07/09/24 1600  Peri-wound Assessment Denuded;Induration;Pink;Erythema (non-blanchable) 07/09/24 1600  Wound Length (cm) 4 cm 07/09/24 1600  Wound Width (cm) 3 cm 07/09/24 1600  Wound Depth (cm) 4.3 cm 07/09/24 1600  Wound Surface Area (cm^2) 12 cm^2 07/09/24 1600  Wound Volume (cm^3) 51.6 cm^3 07/09/24 1600  Tunneling (cm) 3.4 cm at 3:00 07/09/24 1600  Undermining (cm) 12 o'clock 0.5, 1 o'clock 1.9 cm, 2 o'clock 2.1 cm, 6 o'clock 0.4, 9 o'clock 0.3cm 07/09/24 1600  Margins Unattached edges (unapproximated) 07/09/24 1600  Drainage  Amount Moderate 07/09/24 1600  Drainage Description Odor - foul;Serous 07/09/24 1600  Treatment Cleansed;Debridement (Selective);Irrigation;Packing (Saline gauze) 07/09/24 1600   Selective Debridement (non-excisional) Selective Debridement (non-excisional) - Location: L hip Selective Debridement (non-excisional) - Tools Used: Forceps, Scalpel, Scissors Selective Debridement (non-excisional) - Tissue Removed: Slough & non-viable tissue    Wound Assessment and Plan  Wound Therapy - Assess/Plan/Recommendations Wound Therapy - Clinical Statement: Wound continues to evolve with previously visualize striated tissue in the base of the wound now mushy and brown. Undermining continues to progress from 12 o'clock to 3 o'clock with a tunnel progressing at 3 o'clock. Able to debride a sizable amount of tissue in the region of undermining and tunneling. Pt will continue to benefit from selective debridement to reduce bioburden. Wound Therapy - Functional Problem List: Decreased tolerance for position changes or OOB. Factors Delaying/Impairing Wound Healing: Diabetes Mellitus, Immobility, Infection - systemic/local, Multiple medical problems Hydrotherapy Plan: Debridement, Dressing change, Patient/family education Wound Therapy - Frequency: 2X / week Wound Therapy - Follow Up Recommendations: dressing changes by RN  Wound Therapy Goals- Improve the function of patient's integumentary system by progressing the wound(s) through the phases of wound healing (inflammation - proliferation - remodeling) by: Wound Therapy Goals - Improve the function of patient's integumentary system by progressing the wound(s) through the phases of wound healing by: Decrease Necrotic Tissue to: 20% Decrease Necrotic Tissue - Progress: Not progressing Increase Granulation Tissue to: 80% Increase Granulation Tissue - Progress: Not progressing Goals/treatment plan/discharge plan were made with and agreed upon by patient/family:  Yes Time For Goal Achievement: 7 days Wound Therapy - Potential for Goals: Fair  Goals will be updated until maximal potential achieved or discharge criteria met.  Discharge criteria: when goals achieved, discharge from hospital, MD decision/surgical intervention, no progress towards goals, refusal/missing three consecutive treatments without notification or medical reason.  GP     Charges PT Wound Care Charges $Wound Debridement up to 20 cm: < or equal to 20 cm $PT Hydrotherapy Dressing: 3 dressings $PT Hydrotherapy Visit: 1 Visit      Avenir Lozinski B. [name] PT, DPT Acute Rehabilitation Services Please use secure chat or  Call Office [phone]  [name] [facility] 07/09/2024, 4:40 PM

## 2024-07-09 NOTE — Plan of Care (Signed)
   Problem: Fluid Volume: Goal: Ability to maintain a balanced intake and output will improve Outcome: Progressing   Problem: Metabolic: Goal: Ability to maintain appropriate glucose levels will improve Outcome: Progressing   Problem: Skin Integrity: Goal: Risk for impaired skin integrity will decrease Outcome: Progressing

## 2024-07-10 ENCOUNTER — Ambulatory Visit: Payer: Medicare Other | Admitting: Nurse Practitioner

## 2024-07-10 DIAGNOSIS — A419 Sepsis, unspecified organism: Secondary | ICD-10-CM | POA: Diagnosis not present

## 2024-07-10 DIAGNOSIS — R6520 Severe sepsis without septic shock: Secondary | ICD-10-CM | POA: Diagnosis not present

## 2024-07-10 LAB — COMPREHENSIVE METABOLIC PANEL WITH GFR
ALT: 37 U/L (ref 0–44)
AST: 41 U/L (ref 15–41)
Albumin: 3.1 g/dL — ABNORMAL LOW (ref 3.5–5.0)
Alkaline Phosphatase: 143 U/L — ABNORMAL HIGH (ref 38–126)
Anion gap: 13 (ref 5–15)
BUN: 32 mg/dL — ABNORMAL HIGH (ref 8–23)
CO2: 24 mmol/L (ref 22–32)
Calcium: 8.4 mg/dL — ABNORMAL LOW (ref 8.9–10.3)
Chloride: 106 mmol/L (ref 98–111)
Creatinine, Ser: 0.78 mg/dL (ref 0.61–1.24)
GFR, Estimated: >60 mL/min (ref >60)
Glucose, Bld: 157 mg/dL — ABNORMAL HIGH (ref 70–99)
Potassium: 3.5 mmol/L (ref 3.5–5.1)
Sodium: 143 mmol/L (ref 135–145)
Total Bilirubin: 2 mg/dL — ABNORMAL HIGH (ref 0.0–1.2)
Total Protein: 5.5 g/dL — ABNORMAL LOW (ref 6.5–8.1)

## 2024-07-10 LAB — MAGNESIUM: Magnesium: 1.9 mg/dL (ref 1.7–2.4)

## 2024-07-10 LAB — CBC
HCT: 33.1 % — ABNORMAL LOW (ref 39.0–52.0)
Hemoglobin: 11.3 g/dL — ABNORMAL LOW (ref 13.0–17.0)
MCH: 29.3 pg (ref 26.0–34.0)
MCHC: 34.1 g/dL (ref 30.0–36.0)
MCV: 85.8 fL (ref 80.0–100.0)
Platelets: 206 K/uL (ref 150–400)
RBC: 3.86 MIL/uL — ABNORMAL LOW (ref 4.22–5.81)
RDW: 18.7 % — ABNORMAL HIGH (ref 11.5–15.5)
WBC: 5.9 K/uL (ref 4.0–10.5)
nRBC: 0 % (ref 0.0–0.2)

## 2024-07-10 LAB — GLUCOSE, CAPILLARY
Glucose-Capillary: 95 mg/dL (ref 70–99)
Glucose-Capillary: 146 mg/dL — ABNORMAL HIGH (ref 70–99)
Glucose-Capillary: 200 mg/dL — ABNORMAL HIGH (ref 70–99)

## 2024-07-10 LAB — PHOSPHORUS: Phosphorus: 2.2 mg/dL — ABNORMAL LOW (ref 2.5–4.6)

## 2024-07-10 NOTE — Consult Note (Addendum)
 WOC Nurse wound follow up Wound type: Unstageable pressure injury left shoulder Stage 4 pressure injury: left trochanter Unstageable Pressure Injury; right heel Stage 4 Pressure Injury; sacrum Measurement: Left shoulder 2 cm x 1 cm Left trochanter: 3.5 cm x 2.5 cm x 2 cm with 4 cm undermining from 12 o'clock to 3 o'clock Right heel: 4 cm x 3 cm Sacrum: 6 cm x 3 cm x 0.5 cm with  2 cm undermining from 12 o'clock to 3 o'clock  Wound bed: Left shoulder:  60% red/pink, moist, 40% yellow slough Left trochanter: 90% red, moist, 10% yellow slough in the superior aspect of wound Right heel: 100% covered by black eschar with slight lifting Sacrum: 50% red, moist, 50% scattered yellow slough Drainage (amount, consistency, odor)  Left shoulder: serosanguinous Left trochanter: serosanguinous Right heel: serosanguinous  Sacrum: purulent Periwound: intact Dressing procedure/placement/frequency: 1- Left shoulder Unstageable  Cleanse with NS, pat dry, Apply 1/4 thick layer of Santyl  to wound bed, top with saline moist gauze. Top with silicone foam.  Change daily. 2- Left trochanter: Cleanse with Vashe (lawson # [id number]) allow to aid dry.  Apply 1/4 thick layer of Santyl  to wound bed, top with saline moist gauze. Top with silicone foam.  Change daily.     3 - Sacral Stage 4 Pressure Injury  Cleanse with Vashe#[id number]. Apply Aquacel Ag+ (silver hydrofiber) Lawson # [phone] in the wound bed and fill the undermining using a swab, cover with sacral foam dressing, change daily and PRN soilage  3- Right heel; Unstageable; paint with betadine and leave open to air; offload with pillows or Prevalon boots at all times   Off load left heel with Prevalon boots at all times.   LALM in place for moisture management and pressure redistribution.  Plan of care discussed with daughter, Para Henkels, via telephone call.  WOC Nurse team will follow with you and see patient within 10 days for wound assessments.   Please notify WOC nurses of any acute changes in the wounds or any new areas of concern  Doyal Polite, RN, MSN, [facility] WOC Team [phone] (Available Mon-Fri 0700-1500)

## 2024-07-10 NOTE — Plan of Care (Signed)
  Problem: Fluid Volume: Goal: Hemodynamic stability will improve Outcome: Progressing   Problem: Clinical Measurements: Goal: Diagnostic test results will improve Outcome: Progressing Goal: Signs and symptoms of infection will decrease Outcome: Progressing   Problem: Education: Goal: Knowledge of risk factors and measures for prevention of condition will improve Outcome: Progressing   Problem: Coping: Goal: Psychosocial and spiritual needs will be supported Outcome: Progressing   Problem: Respiratory: Goal: Will maintain a patent airway Outcome: Progressing Goal: Complications related to the disease process, condition or treatment will be avoided or minimized Outcome: Progressing   Problem: Education: Goal: Ability to describe self-care measures that may prevent or decrease complications (Diabetes Survival Skills Education) will improve Outcome: Progressing   Problem: Coping: Goal: Ability to adjust to condition or change in health will improve Outcome: Progressing   Problem: Fluid Volume: Goal: Ability to maintain a balanced intake and output will improve Outcome: Progressing   Problem: Health Behavior/Discharge Planning: Goal: Ability to manage health-related needs will improve Outcome: Progressing   Problem: Metabolic: Goal: Ability to maintain appropriate glucose levels will improve Outcome: Progressing   Problem: Nutritional: Goal: Maintenance of adequate nutrition will improve Outcome: Progressing Goal: Progress toward achieving an optimal weight will improve Outcome: Progressing   Problem: Skin Integrity: Goal: Risk for impaired skin integrity will decrease Outcome: Progressing   Problem: Tissue Perfusion: Goal: Adequacy of tissue perfusion will improve Outcome: Progressing   Problem: Education: Goal: Knowledge of General Education information will improve Description: Including pain rating scale, medication(s)/side effects and non-pharmacologic  comfort measures Outcome: Progressing   Problem: Health Behavior/Discharge Planning: Goal: Ability to manage health-related needs will improve Outcome: Progressing   Problem: Clinical Measurements: Goal: Ability to maintain clinical measurements within normal limits will improve Outcome: Progressing Goal: Will remain free from infection Outcome: Progressing Goal: Diagnostic test results will improve Outcome: Progressing Goal: Respiratory complications will improve Outcome: Progressing Goal: Cardiovascular complication will be avoided Outcome: Progressing   Problem: Activity: Goal: Risk for activity intolerance will decrease Outcome: Progressing   Problem: Nutrition: Goal: Adequate nutrition will be maintained Outcome: Progressing   Problem: Coping: Goal: Level of anxiety will decrease Outcome: Progressing   Problem: Elimination: Goal: Will not experience complications related to bowel motility Outcome: Progressing Goal: Will not experience complications related to urinary retention Outcome: Progressing   Problem: Pain Managment: Goal: General experience of comfort will improve and/or be controlled Outcome: Progressing   Problem: Safety: Goal: Ability to remain free from injury will improve Outcome: Progressing   Problem: Skin Integrity: Goal: Risk for impaired skin integrity will decrease Outcome: Progressing

## 2024-07-10 NOTE — Progress Notes (Signed)
 PROGRESS NOTE    Earl [name]  FMW:[id number] DOB: Dec 06, 1940 DOA: 03/04/2024 PCP: [name], MD   Brief Narrative:  83 year old M with PMH of PAF on Eliquis , HFmrEF, amyloid cardiomyopathy, NIDDM-2, CVA with right-sided weakness, stage IV sacral wound, pancytopenia, MGUS, HTN, HLD and hypothyroidism brought to ED by EMS from [facility] rehab due to pain in sacral area and shortness of breath 4 days after he was discharged from the hospital.  Patient was hospitalized from 3/26-4/10 with generalized weakness, encephalopathy and COVID-19 infection.  Per daughter, patient sustained skin injury during this hospitalization, and he had significant pain, discomfort and progressive foul-smelling discharge from sacral wound over the course of 4 to 5 days before coming back to ED. he was admitted due to persistent wounds.  General surgery, orthopedic surgery, ID all consulted   See plan and assessment for more on hospital course.  Significant imaging / results / micro data: 4/14-COVID-19 PCR positive.  Flu and RSV PCR nonreactive. 4/14-blood cultures NGTD 6/4-sacral wound culture with Pseudomonas aeruginosa and Klebsiella pneumoniae 7/14-pleural fluid culture NGTD  Assessment & Plan:   Stage IV sacral decubitus wound/ulcer with severe sepsis on admission  - Culture data as above. -4/14-CT showed sacral wound extension to the right gluteus muscle without signs of osteomyelitis. -4/15-underwent bedside debridement of sacral decubitus wound by general surgery -4/21-ID consulted and recommended continuing broad-spectrum antibiotics that he completed on 6/13. 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 -6/17- ID recommended continuing holding off antibiotics since he is hemodynamically stable, afebrile without leukocytosis. -6/17-evaluated by plastic surgery.  Sacral wound felt to be clean.  Wound care recommended -7/13-7/13-CT  showed similar appearance of osteomyelitis underlying sacral decubitus ulcer.  -7/13-plastic surgery recommended for continued wound care and no surgical management. -7/14-Ortho evaluated patient for left hip wound.  Debulked and recommended optimizing nutrition -8/5- Surgery recommended hydrotherapy for left hip wound. Surgical debridement if not successful -8/5-wound care and instruction updated  -8/15-left hip wound worse with tunneling. Daughter concerned about this.  X-ray with left lateral hip soft tissue defect.  No acute bony abnormality.  -Overall, difficult case due to patient's nutritional status and limited mobility.  Prealbumin <5. - CT scan of the hip 8/15 with new left hip wound which extends deep, 2.8 cm without evidence of acute osteolysis involving the underlying left greater trochanter. - The pads on top of the heel wound are not sticking well due to dry and flaky skin.  Discussed with the bedside RN to put the foam pads and then wrap the foot and ankle to prevent them from sliding follow wound care consult recommendations.  Severe sepsis-present on admission.  Resolved.   Aspiration pneumonia-patient with worsening congestion, increased cough on 8/18.  Chest x-ray 8/18 with lower lobes collapsed and possible underlying pneumonia, likely due to being in bed for prolonged amount of time.  With worsening symptoms favor doing a 5-day course of antibiotics with ceftriaxone  and metronidazole  (he is pen allergic).  Today is day 3/5 - Diet as per SLP's recommendations   Acute on chronic combined CHF, cardiac amyloidosis, nonischemic cardiomyopathy, hypotension - on tafamidis  and vutrisiran  for amyloid.  Goal-directed medical therapy somewhat limited by prior episodes of hypotension.   - Has received few doses of IV Lasix  recently along with IV albumin  including 1 dose of IV Lasix  today.  Will start oral Lasix  from tomorrow. -Continue Tafamidis  and vutrisiran . -Ace wrap for BLE  edema -Strict intake and output, daily weights, renal  functions and electrolytes   Elevated LFTs  - Improving.  Lipitor still on hold.  Monitor intermittently.   Acute on chronic pain-continue current pain management with lidocaine  patch and as needed oxycodone    PAF-continue amiodarone  and Eliquis .  Currently rate controlled.  No bleeding   Anemia of chronic disease-from chronic illnesses.  Monitor intermittently.  No signs of bleeding.  Hemoglobin currently stable.  History of CVA-stable, on Eliquis , holding Lipitor due to LFTs   Concern for acute cholecystitis-ruled out, right upper quadrant ultrasound initially raise concern, however HIDA scan was negative.  This is likely due to anasarca/third spacing.     Dysphagia/severe protein calorie malnutrition-continue supplementation, pured diet   Physical debilitation, muscle weakness-PT/OT ongoing   Goals of care - Daughter wants him to transition at home once his wounds are better.  Prior hospitalist discussed with daughter on 07/07/24 that his wounds might not improve.  Daughter does not want SNF or LTAC.  Palliative care following intermittently.  Patient is DNR.  hypothyroidism -Continue levothyroxine    Anxiety and adjustment disorder -Psychiatry consulted on 7/7 and recommended continuing Zoloft .  Psychiatry has signed off.   IDDM -continue Semglee , sliding scale, Accu-Cheks   Multiple pressure skin injuries: Present on admission.  Follow wound care consult recommendations. Pressure Injury 02/26/24 Coccyx Mid Stage 4 - Full thickness tissue loss with exposed bone, tendon or muscle. (Active)  02/26/24 0916  Location: Coccyx  Location Orientation: Mid  Staging: Stage 4 - Full thickness tissue loss with exposed bone, tendon or muscle.  Wound Description (Comments):   DO NOT USE:  Present on Admission: Yes     Pressure Injury 02/26/24 Toe (Comment  which one) Anterior;Right Deep Tissue Pressure Injury - Purple or maroon localized  area of discolored intact skin or blood-filled blister due to damage of underlying soft tissue from pressure and/or shear. right gre (Active)  02/26/24 1215  Location: Toe (Comment  which one)  Location Orientation: Anterior;Right  Staging: Deep Tissue Pressure Injury - Purple or maroon localized area of discolored intact skin or blood-filled blister due to damage of underlying soft tissue from pressure and/or shear.  Wound Description (Comments): right great toe  DO NOT USE:  Present on Admission:      Pressure Injury 03/06/24 Hip Anterior;Left;Proximal Unstageable - Full thickness tissue loss in which the base of the injury is covered by slough (yellow, tan, gray, green or brown) and/or eschar (tan, brown or black) in the wound bed. unstageable (Active)  03/06/24 1612  Location: Hip  Location Orientation: Anterior;Left;Proximal  Staging: Unstageable - Full thickness tissue loss in which the base of the injury is covered by slough (yellow, tan, gray, green or brown) and/or eschar (tan, brown or black) in the wound bed.  Wound Description (Comments): unstageable  DO NOT USE:  Present on Admission: No     Pressure Injury 03/06/24 Shoulder Left Unstageable - Full thickness tissue loss in which the base of the injury is covered by slough (yellow, tan, gray, green or brown) and/or eschar (tan, brown or black) in the wound bed. unstageable (Active)  03/06/24 1613  Location: Shoulder  Location Orientation: Left  Staging: Unstageable - Full thickness tissue loss in which the base of the injury is covered by slough (yellow, tan, gray, green or brown) and/or eschar (tan, brown or black) in the wound bed.  Wound Description (Comments): unstageable  DO NOT USE:  Present on Admission: No     Pressure Injury 04/23/24 Heel Right Unstageable - Full thickness  tissue loss in which the base of the injury is covered by slough (yellow, tan, gray, green or brown) and/or eschar (tan, brown or black) in the wound  bed. DTPI evolving into unstageable (Active)  04/23/24   Location: Heel  Location Orientation: Right  Staging: Unstageable - Full thickness tissue loss in which the base of the injury is covered by slough (yellow, tan, gray, green or brown) and/or eschar (tan, brown or black) in the wound bed.  Wound Description (Comments): DTPI evolving into unstageable  DO NOT USE:  Present on Admission: No     Pressure Injury 04/23/24 Heel Left Deep Tissue Pressure Injury - Purple or maroon localized area of discolored intact skin or blood-filled blister due to damage of underlying soft tissue from pressure and/or shear. (Active)  04/23/24   Location: Heel  Location Orientation: Left  Staging: Deep Tissue Pressure Injury - Purple or maroon localized area of discolored intact skin or blood-filled blister due to damage of underlying soft tissue from pressure and/or shear.  Wound Description (Comments):   DO NOT USE:  Present on Admission: No       DVT prophylaxis: Eliquis  Code Status: DNR Family Communication: None at bedside Disposition Plan: Status is: Inpatient Remains inpatient appropriate because: Of severity of illness  Consultants:  PCCM General Surgery Cardiology Infectious disease Plastic surgery Orthopedic surgery Palliative medicine  Procedures: As above  Antimicrobials:  Anti-infectives (From admission, onward)    Start     Dose/Rate Route Frequency Ordered Stop   07/08/24 1045  metroNIDAZOLE  (FLAGYL ) tablet 500 mg        500 mg Oral Every 12 hours 07/08/24 0951 07/13/24 0959   07/08/24 1045  cefTRIAXone  (ROCEPHIN ) 2 g in sodium chloride  0.9 % 100 mL IVPB        2 g 200 mL/hr over 30 Minutes Intravenous Every 24 hours 07/08/24 0951 07/13/24 1044   04/27/24 1615  cefTAZidime  (FORTAZ ) 2 g in sodium chloride  0.9 % 100 mL IVPB        2 g 200 mL/hr over 30 Minutes Intravenous Every 8 hours 04/27/24 1518 05/03/24 2359   03/16/24 1015  cefTRIAXone  (ROCEPHIN ) 2 g in sodium  chloride 0.9 % 100 mL IVPB        2 g 200 mL/hr over 30 Minutes Intravenous Every 24 hours 03/16/24 0929 03/20/24 1219   03/16/24 1015  doxycycline  (VIBRA -TABS) tablet 100 mg        100 mg Oral Every 12 hours 03/16/24 0929 03/20/24 2131   03/14/24 1000  metroNIDAZOLE  (FLAGYL ) tablet 500 mg        500 mg Oral Every 12 hours 03/14/24 0803 03/19/24 2219   03/13/24 1000  cefadroxil  (DURICEF) capsule 1,000 mg  Status:  Discontinued        1,000 mg Oral 2 times daily 03/12/24 1632 03/16/24 0929   03/07/24 2200  metroNIDAZOLE  (FLAGYL ) tablet 500 mg        500 mg Oral Every 12 hours 03/07/24 0840 03/11/24 2258   03/05/24 1930  vancomycin  (VANCOREADY) IVPB 1250 mg/250 mL  Status:  Discontinued        1,250 mg 166.7 mL/hr over 90 Minutes Intravenous Every 24 hours 03/04/24 2205 03/04/24 2224   03/05/24 1930  Vancomycin  (VANCOCIN ) 1,250 mg in sodium chloride  0.9 % 250 mL IVPB  Status:  Discontinued        1,250 mg 166.7 mL/hr over 90 Minutes Intravenous Every 24 hours 03/04/24 2224 03/05/24 0717   03/05/24 1930  vancomycin  (VANCOREADY) IVPB 1500 mg/300 mL  Status:  Discontinued        1,500 mg 150 mL/hr over 120 Minutes Intravenous Every 24 hours 03/05/24 0717 03/12/24 1636   03/05/24 1400  ceFEPIme  (MAXIPIME ) 2 g in sodium chloride  0.9 % 100 mL IVPB  Status:  Discontinued        2 g 200 mL/hr over 30 Minutes Intravenous Every 8 hours 03/05/24 0717 03/12/24 1632   03/05/24 0600  ceFEPIme  (MAXIPIME ) 2 g in sodium chloride  0.9 % 100 mL IVPB  Status:  Discontinued        2 g 200 mL/hr over 30 Minutes Intravenous Every 12 hours 03/04/24 2205 03/05/24 0717   03/05/24 0000  metroNIDAZOLE  (FLAGYL ) IVPB 500 mg  Status:  Discontinued        500 mg 100 mL/hr over 60 Minutes Intravenous Every 12 hours 03/04/24 2206 03/04/24 2209   03/04/24 1730  aztreonam (AZACTAM) 2 g in sodium chloride  0.9 % 100 mL IVPB  Status:  Discontinued        2 g 200 mL/hr over 30 Minutes Intravenous  Once 03/04/24 1715 03/04/24  1724   03/04/24 1730  metroNIDAZOLE  (FLAGYL ) IVPB 500 mg  Status:  Discontinued        500 mg 100 mL/hr over 60 Minutes Intravenous Every 12 hours 03/04/24 1715 03/07/24 0840   03/04/24 1730  vancomycin  (VANCOREADY) IVPB 1500 mg/300 mL        1,500 mg 150 mL/hr over 120 Minutes Intravenous  Once 03/04/24 1715 03/04/24 2153   03/04/24 1730  ceFEPIme  (MAXIPIME ) 2 g in sodium chloride  0.9 % 100 mL IVPB        2 g 200 mL/hr over 30 Minutes Intravenous  Once 03/04/24 1724 03/04/24 1826         Subjective: Patient seen and examined at bedside.  47.  No seizures, fever, vomiting reported.  Objective: Vitals:   07/09/24 1936 07/09/24 2005 07/10/24 0009 07/10/24 0926  BP:  126/73 125/70 113/73  Pulse:  76 74 76  Resp:   18 19  Temp:  97.7 F (36.5 C) 97.9 F (36.6 C) 97.6 F (36.4 C)  TempSrc:      SpO2: 99% 100% 100% 96%  Weight:      Height:        Intake/Output Summary (Last 24 hours) at 07/10/2024 1042 Last data filed at 07/09/2024 1841 Gross per 24 hour  Intake 340 ml  Output 1700 ml  Net -1360 ml   Filed Weights   07/07/24 0500 07/08/24 0600 07/09/24 0320  Weight: 70.8 kg 71.7 kg 75.3 kg    Examination:  General exam: Appears calm and comfortable.  Looks chronically ill and condition. Respiratory system: Bilateral decreased breath sounds at bases with scattered crackles Cardiovascular system: S1 & S2 heard, Rate controlled Gastrointestinal system: Abdomen is nondistended, soft and nontender. Normal bowel sounds heard. Extremities: No cyanosis, clubbing, edema  Central nervous system: Awake, slow to respond, poor historian.  No obvious focal deficits.   Skin: No ecchymosis/lesions Psychiatry: Flat affect.  Not agitated.    Data Reviewed: I have personally reviewed following labs and imaging studies  CBC: Recent Labs  Lab 07/06/24 0847 07/08/24 0641 07/09/24 0433 07/10/24 0233  WBC 4.4 6.4 6.5 5.9  HGB 13.1 11.6* 11.6* 11.3*  HCT 40.2 34.1* 33.5* 33.1*   MCV 89.5 87.0 85.2 85.8  PLT 195 206 187 206   Basic Metabolic Panel: Recent Labs  Lab 07/04/24 1056 07/06/24 0847  07/08/24 0641 07/09/24 0433 07/10/24 0233  NA 140 142 140 141 143  K 4.5 4.2 4.3 3.7 3.5  CL 106 108 107 105 106  CO2 26 23 23 26 24   GLUCOSE 100* 106* 131* 91 157*  BUN 40* 28* 27* 28* 32*  CREATININE 1.04 0.88 0.84 0.87 0.78  CALCIUM  8.9 8.7* 8.7* 8.6* 8.4*  MG 2.0 1.9 2.1 1.9 1.9  PHOS  --   --  2.1* 2.3* 2.2*   GFR: Estimated Creatinine Clearance: 74.5 mL/min (by C-G formula based on SCr of 0.78 mg/dL). Liver Function Tests: Recent Labs  Lab 07/04/24 1056 07/06/24 0847 07/08/24 0641 07/09/24 0433 07/10/24 0233  AST 66* 55* 46* 51* 41  ALT 71* 60* 44 43 37  ALKPHOS 195* 200* 162* 152* 143*  BILITOT 1.6* 2.2* 2.5* 2.1* 2.0*  PROT 5.8* 5.8* 5.8* 5.5* 5.5*  ALBUMIN  2.6* 2.5* 3.1* 2.7* 3.1*   No results for input(s): LIPASE, AMYLASE in the last 168 hours. No results for input(s): AMMONIA in the last 168 hours. Coagulation Profile: No results for input(s): INR, PROTIME in the last 168 hours. Cardiac Enzymes: No results for input(s): CKTOTAL, CKMB, CKMBINDEX, TROPONINI in the last 168 hours. BNP (last 3 results) No results for input(s): PROBNP in the last 8760 hours. HbA1C: No results for input(s): HGBA1C in the last 72 hours. CBG: Recent Labs  Lab 07/09/24 0627 07/09/24 0835 07/09/24 1151 07/09/24 1700 07/10/24 0926  GLUCAP 86 96 175* 192* 95   Lipid Profile: No results for input(s): CHOL, HDL, LDLCALC, TRIG, CHOLHDL, LDLDIRECT in the last 72 hours. Thyroid  Function Tests: No results for input(s): TSH, T4TOTAL, FREET4, T3FREE, THYROIDAB in the last 72 hours. Anemia Panel: No results for input(s): VITAMINB12, FOLATE, FERRITIN, TIBC, IRON, RETICCTPCT in the last 72 hours. Sepsis Labs: No results for input(s): PROCALCITON, LATICACIDVEN in the last 168 hours.  No results found for  this or any previous visit (from the past 240 hours).       Radiology Studies: DG Swallowing Func-Speech Pathology Result Date: 07/09/2024 Table formatting from the original result was not included. Modified Barium Swallow Study Patient Details Name: Graysin Luczynski MRN: [id number] Date of Birth: 1941-10-15 Today's Date: 07/09/2024 HPI/PMH: HPI: Keiyon Plack is an 83 y.o. 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 He was discharged from the hospital (admitted for acute metabolic encephalopathy, hypotensive, hypoglycemic) to [facility] for rehab. He presented back to the hospital four days later on 03/04/24 with SOB and pain in sacral area. 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 SLP previously saw pt to assess and manage concerns for dysphagia. He was D/Cd on a pureed diet with thin liquids. SLP re-consulted on 05/02/24 for voice assessment; signed off on 8/4 with recs for OP ENT if warranted. New swallowing consult ordered 8/18 due to new onset coughing after eating. Clinical Impression: Clinical Impression: Pt presents with a mild pharyngeal dysphagia characterized by mistiming of the pharyngeal swallow, with thin liquids intermittently spilling into the larynx just before airway closure.  Aspiration was trace, inconsistent, and usually occurred with large boluses.  Chin tuck, straws had no impact on swallow physiology. All other aspects of the swallow were WFL, including pharyngeal strength (no residue post-swallow).  Esophageal sweep revealed clearance of barium.  Pt swallowed a 13 mm barium pill  whole without difficulty. Recommend thickening liquids to nectar while pt is recovering from pna.  Spoke with his daughter, Murrell, re: results and plan; this  info was communicated to RN and Dr. Trixie as well.  REC: dysphagia 1, nectar thick liquids. SLP will follow. Factors that may increase risk of adverse event in presence of aspiration Noe & Lianne 2021): Factors that may increase risk of adverse event in presence of aspiration Noe & Lianne 2021): Limited mobility; Frail or deconditioned; Poor general health and/or compromised immunity Recommendations/Plan: Swallowing Evaluation Recommendations Swallowing Evaluation Recommendations Recommendations: PO diet PO Diet Recommendation: Dysphagia 1 (Pureed); Mildly thick liquids (Level 2, nectar thick) Liquid Administration via: Cup; Straw Medication Administration: Whole meds with puree Supervision: Staff to assist with self-feeding Postural changes: Position pt fully upright for meals Oral care recommendations: Oral care BID (2x/day) Treatment Plan Treatment Plan Treatment recommendations: Therapy as outlined in treatment plan below Follow-up recommendations: No SLP follow up Functional status assessment: Patient has had a recent decline in their functional status and demonstrates the ability to make significant improvements in function in a reasonable and predictable amount of time. Treatment frequency: Min 2x/week Treatment duration: 2 weeks Interventions: Diet toleration management by SLP; Trials of upgraded texture/liquids; Patient/family education Recommendations Recommendations for follow up therapy are one component of a multi-disciplinary discharge planning process, led by the attending physician.  Recommendations may be updated based on patient status, additional functional criteria and insurance authorization. Assessment: Orofacial Exam: Orofacial Exam Oral Cavity: Oral Hygiene: WFL Oral Cavity - Dentition: Missing dentition Orofacial Anatomy: WFL Oral Motor/Sensory Function: WFL Anatomy: Anatomy: WFL Boluses Administered: Boluses Administered Boluses Administered: Thin liquids (Level 0); Mildly thick  liquids (Level 2, nectar thick); Moderately thick liquids (Level 3, honey thick); Puree  Oral Impairment Domain: Oral Impairment Domain Lip Closure: No labial escape Tongue control during bolus hold: Not tested Bolus preparation/mastication: -- (mechanical solids not tested) Initiation of pharyngeal swallow : Pyriform sinuses  Pharyngeal Impairment Domain: Pharyngeal Impairment Domain Soft palate elevation: No bolus between soft palate (SP)/pharyngeal wall (PW) Laryngeal elevation: Complete superior movement of thyroid  cartilage with complete approximation of arytenoids to epiglottic petiole Anterior hyoid excursion: Complete anterior movement Epiglottic movement: Complete inversion Laryngeal vestibule closure: Incomplete, narrow column air/contrast in laryngeal vestibule Pharyngeal stripping wave : Present - complete Pharyngeal contraction (A/P view only): N/A Pharyngoesophageal segment opening: Partial distention/partial duration, partial obstruction of flow Tongue base retraction: No contrast between tongue base and posterior pharyngeal wall (PPW) Pharyngeal residue: Complete pharyngeal clearance Location of pharyngeal residue: N/A  Esophageal Impairment Domain: Esophageal Impairment Domain Esophageal clearance upright position: Complete clearance, esophageal coating Pill: Pill Consistency administered: Puree Puree: WFL Penetration/Aspiration Scale Score: Penetration/Aspiration Scale Score 1.  Material does not enter airway: Mildly thick liquids (Level 2, nectar thick); Moderately thick liquids (Level 3, honey thick); Puree 7.  Material enters airway, passes BELOW cords and not ejected out despite cough attempt by patient: Thin liquids (Level 0) Compensatory Strategies: Compensatory Strategies Compensatory strategies: Yes Straw: Ineffective Ineffective Straw: Thin liquid (Level 0) Chin tuck: Ineffective Ineffective Chin Tuck: Thin liquid (Level 0)   General Information: Caregiver present: No  Diet Prior to this  Study: Dysphagia 1 (pureed); Thin liquids (Level 0)   Temperature : Normal   No data recorded  Supplemental O2: None (Room air)   History of Recent Intubation: No  Behavior/Cognition: Alert; Cooperative Self-Feeding Abilities: Able to self-feed Baseline vocal quality/speech: Dysphonic Volitional Cough: Able to elicit Volitional Swallow: Able to elicit Exam Limitations: No  limitations Goal Planning: Prognosis for improved oropharyngeal function: Good No data recorded No data recorded No data recorded No data recorded Pain: Pain Assessment Pain Assessment: Faces Faces Pain Scale: 2 Pain Location: generalized with repositioning. Pain Descriptors / Indicators: Discomfort; Grimacing Pain Intervention(s): Limited activity within patient's tolerance End of Session: Start Time:SLP Start Time (ACUTE ONLY): 0917 Stop Time: SLP Stop Time (ACUTE ONLY): 0947 Time Calculation:SLP Time Calculation (min) (ACUTE ONLY): 30 min Charges: SLP Evaluations $ SLP Speech Visit: 1 Visit SLP Evaluations $BSS Swallow: 1 Procedure $MBS Swallow: 1 Procedure SLP visit diagnosis: SLP Visit Diagnosis: Dysphagia, unspecified (R13.10) Past Medical History: Past Medical History: Diagnosis Date  Adenomatous colon polyp   Allergy   Anal fissure   Arthritis   BPH (benign prostatic hyperplasia)   Diabetes mellitus (HCC)   GERD (gastroesophageal reflux disease)   Hyperlipidemia   Hypertension   Hypothyroidism   Other testicular hypofunction   Stroke [facility])  Past Surgical History: Past Surgical History: Procedure Laterality Date  CIRCUMCISION N/A 06/11/2019  Procedure: CIRCUMCISION ADULT;  Surgeon: Nieves Cough, MD;  Location: [facility] ORS;  Service: Urology;  Laterality: N/A;  COLONOSCOPY    INGUINAL HERNIA REPAIR Bilateral   1968 and 2004  IR THORACENTESIS ASP PLEURAL SPACE W/IMG GUIDE  05/08/2024  IR THORACENTESIS ASP PLEURAL SPACE W/IMG GUIDE  06/19/2024  IR THORACENTESIS ASP PLEURAL SPACE W/IMG GUIDE  07/02/2024  LOOP RECORDER INSERTION N/A 07/28/2017   Procedure: LOOP RECORDER INSERTION;  Surgeon: Fernande Elspeth BROCKS, MD;  Location: [facility] INVASIVE CV LAB;  Service: Cardiovascular;  Laterality: N/A;  RIGHT/LEFT HEART CATH AND CORONARY ANGIOGRAPHY N/A 10/07/2021  Procedure: RIGHT/LEFT HEART CATH AND CORONARY ANGIOGRAPHY;  Surgeon: Rolan Ezra RAMAN, MD;  Location: [facility] INVASIVE CV LAB;  Service: Cardiovascular;  Laterality: N/A;  TEE WITHOUT CARDIOVERSION N/A 07/28/2017  Procedure: TRANSESOPHAGEAL ECHOCARDIOGRAM (TEE);  Surgeon: Shlomo Wilbert SAUNDERS, MD;  Location: [facility] ENDOSCOPY;  Service: Cardiovascular;  Laterality: N/A;  THORACENTESIS Bilateral 06/03/2024  Procedure: MILANA;  Surgeon: Mannam, Praveen, MD;  Location: [facility] ENDOSCOPY;  Service: Cardiopulmonary;  Laterality: Bilateral; Vona Alan Bradford 07/09/2024, 10:12 AM       Scheduled Meds:  (feeding supplement) PROSource Plus  30 mL Oral BID BM   amiodarone   200 mg Oral Daily   apixaban   5 mg Oral BID   calcium  carbonate  1 tablet Oral TID   collagenase    Topical Daily   diclofenac  Sodium  4 g Topical QID   feeding supplement  237 mL Oral BID BM   ferrous sulfate   325 mg Oral QHS   fluticasone   1 spray Each Nare Daily   [START ON 07/11/2024] furosemide   40 mg Oral Daily   guaiFENesin   600 mg Oral BID   insulin  aspart  0-9 Units Subcutaneous TID WC   insulin  glargine-yfgn  5 Units Subcutaneous Daily   levothyroxine   50 mcg Oral Q0600   lidocaine   1 patch Transdermal Q24H   lidocaine -EPINEPHrine  (PF)  30 mL Infiltration Once   liver oil-zinc  oxide   Topical BID   loratadine   10 mg Oral Daily   metroNIDAZOLE   500 mg Oral Q12H   multivitamin with minerals  1 tablet Oral Daily   neomycin -bacitracin -polymyxin   Apply externally Daily   nutrition supplement (JUVEN)  1 packet Oral BID BM   mouth rinse  15 mL Mouth Rinse 4 times per day   pantoprazole   40 mg Oral Daily   polyethylene glycol  17 g Oral BID   senna-docusate  1 tablet Oral  QHS   sertraline   50 mg Oral Daily   sodium chloride  flush   10-40 mL Intracatheter Q12H   sodium chloride  HYPERTONIC  4 mL Nebulization BID   Tafamidis   1 capsule Oral Daily   vitamin A   10,000 Units Oral Daily   vutrisiran  sodium  25 mg Subcutaneous Q90 days   Continuous Infusions:  cefTRIAXone  (ROCEPHIN )  IV 2 g (07/10/24 1025)   dextrose             Sophie Mao, MD [facility] 07/10/2024, 10:42 AM

## 2024-07-11 ENCOUNTER — Ambulatory Visit: Payer: Medicare Other | Admitting: Nurse Practitioner

## 2024-07-11 DIAGNOSIS — A419 Sepsis, unspecified organism: Secondary | ICD-10-CM | POA: Diagnosis not present

## 2024-07-11 DIAGNOSIS — R6520 Severe sepsis without septic shock: Secondary | ICD-10-CM | POA: Diagnosis not present

## 2024-07-11 LAB — GLUCOSE, CAPILLARY
Glucose-Capillary: 102 mg/dL — ABNORMAL HIGH (ref 70–99)
Glucose-Capillary: 131 mg/dL — ABNORMAL HIGH (ref 70–99)
Glucose-Capillary: 94 mg/dL (ref 70–99)
Glucose-Capillary: 126 mg/dL — ABNORMAL HIGH (ref 70–99)

## 2024-07-11 NOTE — Plan of Care (Signed)
  Problem: Respiratory: Goal: Will maintain a patent airway Outcome: Progressing Goal: Complications related to the disease process, condition or treatment will be avoided or minimized Outcome: Progressing   Problem: Coping: Goal: Ability to adjust to condition or change in health will improve Outcome: Progressing   Problem: Fluid Volume: Goal: Ability to maintain a balanced intake and output will improve Outcome: Progressing   Problem: Metabolic: Goal: Ability to maintain appropriate glucose levels will improve Outcome: Progressing   Problem: Nutritional: Goal: Maintenance of adequate nutrition will improve Outcome: Progressing Goal: Progress toward achieving an optimal weight will improve Outcome: Progressing   Problem: Tissue Perfusion: Goal: Adequacy of tissue perfusion will improve Outcome: Progressing

## 2024-07-11 NOTE — Plan of Care (Signed)
  Problem: Fluid Volume: Goal: Hemodynamic stability will improve Outcome: Progressing   Problem: Clinical Measurements: Goal: Diagnostic test results will improve Outcome: Progressing Goal: Signs and symptoms of infection will decrease Outcome: Progressing   Problem: Education: Goal: Knowledge of risk factors and measures for prevention of condition will improve Outcome: Progressing   Problem: Coping: Goal: Psychosocial and spiritual needs will be supported Outcome: Progressing   Problem: Respiratory: Goal: Will maintain a patent airway Outcome: Progressing Goal: Complications related to the disease process, condition or treatment will be avoided or minimized Outcome: Progressing   Problem: Education: Goal: Ability to describe self-care measures that may prevent or decrease complications (Diabetes Survival Skills Education) will improve Outcome: Progressing   Problem: Coping: Goal: Ability to adjust to condition or change in health will improve Outcome: Progressing   Problem: Fluid Volume: Goal: Ability to maintain a balanced intake and output will improve Outcome: Progressing   Problem: Health Behavior/Discharge Planning: Goal: Ability to manage health-related needs will improve Outcome: Progressing   Problem: Metabolic: Goal: Ability to maintain appropriate glucose levels will improve Outcome: Progressing   Problem: Nutritional: Goal: Maintenance of adequate nutrition will improve Outcome: Progressing Goal: Progress toward achieving an optimal weight will improve Outcome: Progressing   Problem: Skin Integrity: Goal: Risk for impaired skin integrity will decrease Outcome: Progressing   Problem: Tissue Perfusion: Goal: Adequacy of tissue perfusion will improve Outcome: Progressing   Problem: Education: Goal: Knowledge of General Education information will improve Description: Including pain rating scale, medication(s)/side effects and non-pharmacologic  comfort measures Outcome: Progressing   Problem: Health Behavior/Discharge Planning: Goal: Ability to manage health-related needs will improve Outcome: Progressing   Problem: Clinical Measurements: Goal: Ability to maintain clinical measurements within normal limits will improve Outcome: Progressing Goal: Will remain free from infection Outcome: Progressing Goal: Diagnostic test results will improve Outcome: Progressing Goal: Respiratory complications will improve Outcome: Progressing Goal: Cardiovascular complication will be avoided Outcome: Progressing   Problem: Activity: Goal: Risk for activity intolerance will decrease Outcome: Progressing   Problem: Nutrition: Goal: Adequate nutrition will be maintained Outcome: Progressing   Problem: Coping: Goal: Level of anxiety will decrease Outcome: Progressing   Problem: Elimination: Goal: Will not experience complications related to bowel motility Outcome: Progressing Goal: Will not experience complications related to urinary retention Outcome: Progressing   Problem: Pain Managment: Goal: General experience of comfort will improve and/or be controlled Outcome: Progressing   Problem: Safety: Goal: Ability to remain free from injury will improve Outcome: Progressing   Problem: Skin Integrity: Goal: Risk for impaired skin integrity will decrease Outcome: Progressing

## 2024-07-11 NOTE — Progress Notes (Signed)
 Mobility Specialist: Progress Note   07/11/24 1500  Mobility  Activity  (UE/LE exercises - bed level)  Range of Motion/Exercises Active Assistive;Active  Activity Response Tolerated well  Mobility Referral Yes  Mobility visit 1 Mobility  Mobility Specialist Start Time (ACUTE ONLY) 1130  Mobility Specialist Stop Time (ACUTE ONLY) 1200  Mobility Specialist Time Calculation (min) (ACUTE ONLY) 30 min    Pt received in bed, agreeable to mobility session. Performed UE and LE exercises, tolerated well.  Left in bed with all needs met, call bell in reach.   UE (with theraband): bicep curls, chest press, front raises, diagonal raises, internal/external shoulder rotation (8-10x each) LE: lying marches, bicycles (10x each)  Ileana Lute Mobility Specialist Please contact via SecureChat or Rehab office at [phone]

## 2024-07-11 NOTE — Progress Notes (Signed)
 Speech Language Pathology Treatment: Dysphagia  Patient Details Name: Earl [name] MRN: [id number] DOB: [birth year] Today's Date: 07/11/2024 Time: 9155-9144 SLP Time Calculation (min) (ACUTE ONLY): 11 min  Assessment / Plan / Recommendation Clinical Impression  Earl [name] was alert and participatory.  His HOB was elevated to maximize safety with PO intake. He accepted sips of nectar-thickened ginger ale, demonstrating good self-regulation with safe pacing and small sips. Only initial verbal cues required to review basic precautions. There were no overt s/s of aspiration.  Recommend he continue on nectar thick liquids to provide some time to recover from current pna.  SLP will follow.    HPI HPI: Earl [name] is an 83 y.o. 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 He was discharged from the hospital (admitted for acute metabolic encephalopathy, hypotensive, hypoglycemic) to [facility] for rehab. He presented back to the hospital four days later on 03/04/24 with SOB and pain in sacral area. 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 SLP previously saw pt to assess and manage concerns for dysphagia. He was D/Cd on a pureed diet with thin liquids. SLP re-consulted on 05/02/24 for voice assessment; signed off on 8/4 with recs for OP ENT if warranted. New swallowing consult ordered 8/18 due to new onset coughing after eating.      SLP Plan  Continue with current plan of care          Recommendations  Diet recommendations: Dysphagia 1 (puree);Nectar-thick liquid Liquids provided via: Cup;Straw Medication Administration: Whole meds with puree Supervision: Staff to assist with self feeding Compensations: Small sips/bites;Slow rate Postural  Changes and/or Swallow Maneuvers: Seated upright 90 degrees                  Oral care BID   Frequent or constant Supervision/Assistance Dysphagia, oropharyngeal phase (R13.12)     Continue with current plan of care    Earl Bigos L. Vona, MA CCC/SLP Clinical Specialist - Acute Care SLP Acute Rehabilitation Services Office number [phone]  Earl [name]  07/11/2024, 9:22 AM

## 2024-07-11 NOTE — TOC Progression Note (Signed)
 Transition of Care [facility]) - Progression Note    Patient Details  Name: Earl [name] MRN: [id number] Date of Birth: Jan 11, 1941  [facility] CM/SW Contact  Rosaline JONELLE Joe, RN Phone Number: 07/11/2024, 11:23 AM  Clinical Narrative:    IP Care management team will continue to follow the patient.  Patient has complex wound care needs.  Patient's daughter recently declined LTAC placement.  Hospital leadership team and Risk Management are aware of patients barriers to discharge.  Expected Discharge Plan: Skilled Nursing Facility Barriers to Discharge: Continued Medical Work up               Expected Discharge Plan and Services In-house Referral: Clinical Social Work     Living arrangements for the past 2 months:  (from [facility] short term rehab)                                       Social Drivers of Health (SDOH) Interventions SDOH Screenings   Food Insecurity: No Food Insecurity (03/05/2024)  Housing: Low Risk  (03/05/2024)  Transportation Needs: No Transportation Needs (03/05/2024)  Utilities: Not At Risk (03/05/2024)  Depression (PHQ2-9): Low Risk  (10/03/2023)  Financial Resource Strain: Patient Declined (01/16/2024)  Physical Activity: Unknown (01/16/2024)  Social Connections: Moderately Integrated (03/05/2024)  Stress: Stress Concern Present (01/16/2024)  Tobacco Use: Low Risk  (07/04/2024)    Readmission Risk Interventions    03/19/2024   10:24 AM  Readmission Risk Prevention Plan  Transportation Screening Complete  Medication Review (RN Care Manager) Complete  PCP or Specialist appointment within 3-5 days of discharge Complete  HRI or Home Care Consult Complete  SW Recovery Care/Counseling Consult Complete  Palliative Care Screening Complete  Skilled Nursing Facility Complete

## 2024-07-11 NOTE — Progress Notes (Signed)
 PROGRESS NOTE    Earl [name]  FMW:[id number] DOB: Jul 17, 1941 DOA: 03/04/2024 PCP: [name], MD   Brief Narrative:  83 year old M with PMH of PAF on Eliquis , HFmrEF, amyloid cardiomyopathy, NIDDM-2, CVA with right-sided weakness, stage IV sacral wound, pancytopenia, MGUS, HTN, HLD and hypothyroidism brought to ED by EMS from [facility] rehab due to pain in sacral area and shortness of breath 4 days after he was discharged from the hospital.  Patient was hospitalized from 3/26-4/10 with generalized weakness, encephalopathy and COVID-19 infection.  Per daughter, patient sustained skin injury during this hospitalization, and he had significant pain, discomfort and progressive foul-smelling discharge from sacral wound over the course of 4 to 5 days before coming back to ED. he was admitted due to persistent wounds.  General surgery, orthopedic surgery, ID all consulted.   See plan and assessment for more on hospital course.  Significant imaging / results / micro data: 4/14-COVID-19 PCR positive.  Flu and RSV PCR nonreactive. 4/14-blood cultures NGTD 6/4-sacral wound culture with Pseudomonas aeruginosa and Klebsiella pneumoniae 7/14-pleural fluid culture NGTD  Assessment & Plan:   Stage IV sacral decubitus wound/ulcer with severe sepsis on admission  - Culture data as above. -4/14-CT showed sacral wound extension to the right gluteus muscle without signs of osteomyelitis. -4/15-underwent bedside debridement of sacral decubitus wound by general surgery -4/21-ID consulted and recommended continuing broad-spectrum antibiotics that he completed on 6/13. 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 -6/17- ID recommended continuing holding off antibiotics since he is hemodynamically stable, afebrile without leukocytosis. -6/17-evaluated by plastic surgery.  Sacral wound felt to be clean.  Wound care  recommended -7/13-7/13-CT showed similar appearance of osteomyelitis underlying sacral decubitus ulcer.  -7/13-plastic surgery recommended for continued wound care and no surgical management. -7/14-Ortho evaluated patient for left hip wound.  Debulked and recommended optimizing nutrition -8/5- Surgery recommended hydrotherapy for left hip wound. Surgical debridement if not successful -8/5-wound care and instruction updated  -8/15-left hip wound worse with tunneling. Daughter concerned about this.  X-ray with left lateral hip soft tissue defect.  No acute bony abnormality.  -Overall, difficult case due to patient's nutritional status and limited mobility.  Prealbumin <5. - CT scan of the hip 8/15 with new left hip wound which extends deep, 2.8 cm without evidence of acute osteolysis involving the underlying left greater trochanter.  Continue wound care as per wound care consult recommendations. - The pads on top of the heel wound are not sticking well due to dry and flaky skin.  Discussed with the bedside RN to put the foam pads and then wrap the foot and ankle to prevent them from sliding follow wound care consult recommendations.  Severe sepsis-present on admission.  Resolved.   Aspiration pneumonia-patient with worsening congestion, increased cough on 8/18.  Chest x-ray 8/18 with lower lobes collapsed and possible underlying pneumonia, likely due to being in bed for prolonged amount of time.  With worsening symptoms favor doing a 5-day course of antibiotics with ceftriaxone  and metronidazole  (he is pen allergic).  Today is day 4/5 - Diet as per SLP's recommendations   Acute on chronic combined CHF, cardiac amyloidosis, nonischemic cardiomyopathy, hypotension - on tafamidis  and vutrisiran  for amyloid.  Goal-directed medical therapy somewhat limited by prior episodes of hypotension.   - Has received few doses of IV Lasix  recently along with IV albumin  including 1 dose of IV Lasix  today.  Will start  oral Lasix  from today. -Continue Tafamidis  and vutrisiran . -Ace wrap  for BLE edema -Strict intake and output, daily weights, renal functions and electrolytes   Elevated LFTs  - Improving.  Lipitor still on hold.  Monitor intermittently.   Acute on chronic pain-continue current pain management with lidocaine  patch and as needed oxycodone    PAF-continue amiodarone  and Eliquis .  Currently rate controlled.  No bleeding   Anemia of chronic disease-from chronic illnesses.  Monitor intermittently.  No signs of bleeding.  Hemoglobin currently stable.  History of CVA-stable, on Eliquis , holding Lipitor due to LFTs   Concern for acute cholecystitis-ruled out, right upper quadrant ultrasound initially raise concern, however HIDA scan was negative.  This is likely due to anasarca/third spacing.     Dysphagia/severe protein calorie malnutrition-continue supplementation, pured diet.  Follow-up SLP and nutrition recommendations   Physical debilitation, muscle weakness-PT/OT ongoing   Goals of care - Daughter wants him to transition at home once his wounds are better.  Prior hospitalist discussed with daughter on 07/07/24 that his wounds might not improve.  Daughter does not want SNF or LTAC.  Palliative care following intermittently.  Patient is DNR.  hypothyroidism -Continue levothyroxine    Anxiety and adjustment disorder -Psychiatry consulted on 7/7 and recommended continuing Zoloft .  Psychiatry has signed off.   IDDM -continue Semglee , sliding scale, Accu-Cheks   Multiple pressure skin injuries: Present on admission.  Follow wound care consult recommendations. Pressure Injury 02/26/24 Coccyx Mid Stage 4 - Full thickness tissue loss with exposed bone, tendon or muscle. (Active)  02/26/24 0916  Location: Coccyx  Location Orientation: Mid  Staging: Stage 4 - Full thickness tissue loss with exposed bone, tendon or muscle.  Wound Description (Comments):   DO NOT USE:  Present on Admission: Yes      Pressure Injury 02/26/24 Toe (Comment  which one) Anterior;Right Deep Tissue Pressure Injury - Purple or maroon localized area of discolored intact skin or blood-filled blister due to damage of underlying soft tissue from pressure and/or shear. right gre (Active)  02/26/24 1215  Location: Toe (Comment  which one)  Location Orientation: Anterior;Right  Staging: Deep Tissue Pressure Injury - Purple or maroon localized area of discolored intact skin or blood-filled blister due to damage of underlying soft tissue from pressure and/or shear.  Wound Description (Comments): right great toe  DO NOT USE:  Present on Admission:      Pressure Injury 03/06/24 Hip Anterior;Left;Proximal Unstageable - Full thickness tissue loss in which the base of the injury is covered by slough (yellow, tan, gray, green or brown) and/or eschar (tan, brown or black) in the wound bed. unstageable (Active)  03/06/24 1612  Location: Hip  Location Orientation: Anterior;Left;Proximal  Staging: Unstageable - Full thickness tissue loss in which the base of the injury is covered by slough (yellow, tan, gray, green or brown) and/or eschar (tan, brown or black) in the wound bed.  Wound Description (Comments): unstageable  DO NOT USE:  Present on Admission: No     Pressure Injury 03/06/24 Shoulder Left Unstageable - Full thickness tissue loss in which the base of the injury is covered by slough (yellow, tan, gray, green or brown) and/or eschar (tan, brown or black) in the wound bed. unstageable (Active)  03/06/24 1613  Location: Shoulder  Location Orientation: Left  Staging: Unstageable - Full thickness tissue loss in which the base of the injury is covered by slough (yellow, tan, gray, green or brown) and/or eschar (tan, brown or black) in the wound bed.  Wound Description (Comments): unstageable  DO NOT USE:  Present  on Admission: No     Pressure Injury 04/23/24 Heel Right Unstageable - Full thickness tissue loss in which the  base of the injury is covered by slough (yellow, tan, gray, green or brown) and/or eschar (tan, brown or black) in the wound bed. DTPI evolving into unstageable (Active)  04/23/24   Location: Heel  Location Orientation: Right  Staging: Unstageable - Full thickness tissue loss in which the base of the injury is covered by slough (yellow, tan, gray, green or brown) and/or eschar (tan, brown or black) in the wound bed.  Wound Description (Comments): DTPI evolving into unstageable  DO NOT USE:  Present on Admission: No     Pressure Injury 04/23/24 Heel Left Deep Tissue Pressure Injury - Purple or maroon localized area of discolored intact skin or blood-filled blister due to damage of underlying soft tissue from pressure and/or shear. (Active)  04/23/24   Location: Heel  Location Orientation: Left  Staging: Deep Tissue Pressure Injury - Purple or maroon localized area of discolored intact skin or blood-filled blister due to damage of underlying soft tissue from pressure and/or shear.  Wound Description (Comments):   DO NOT USE:  Present on Admission: No       DVT prophylaxis: Eliquis  Code Status: DNR Family Communication: None at bedside Disposition Plan: Status is: Inpatient Remains inpatient appropriate because: Of severity of illness  Consultants:  PCCM General Surgery Cardiology Infectious disease Plastic surgery Orthopedic surgery Palliative medicine  Procedures: As above  Antimicrobials:  Anti-infectives (From admission, onward)    Start     Dose/Rate Route Frequency Ordered Stop   07/08/24 1045  metroNIDAZOLE  (FLAGYL ) tablet 500 mg        500 mg Oral Every 12 hours 07/08/24 0951 07/13/24 0959   07/08/24 1045  cefTRIAXone  (ROCEPHIN ) 2 g in sodium chloride  0.9 % 100 mL IVPB        2 g 200 mL/hr over 30 Minutes Intravenous Every 24 hours 07/08/24 0951 07/13/24 1044   04/27/24 1615  cefTAZidime  (FORTAZ ) 2 g in sodium chloride  0.9 % 100 mL IVPB        2 g 200 mL/hr over  30 Minutes Intravenous Every 8 hours 04/27/24 1518 05/03/24 2359   03/16/24 1015  cefTRIAXone  (ROCEPHIN ) 2 g in sodium chloride  0.9 % 100 mL IVPB        2 g 200 mL/hr over 30 Minutes Intravenous Every 24 hours 03/16/24 0929 03/20/24 1219   03/16/24 1015  doxycycline  (VIBRA -TABS) tablet 100 mg        100 mg Oral Every 12 hours 03/16/24 0929 03/20/24 2131   03/14/24 1000  metroNIDAZOLE  (FLAGYL ) tablet 500 mg        500 mg Oral Every 12 hours 03/14/24 0803 03/19/24 2219   03/13/24 1000  cefadroxil  (DURICEF) capsule 1,000 mg  Status:  Discontinued        1,000 mg Oral 2 times daily 03/12/24 1632 03/16/24 0929   03/07/24 2200  metroNIDAZOLE  (FLAGYL ) tablet 500 mg        500 mg Oral Every 12 hours 03/07/24 0840 03/11/24 2258   03/05/24 1930  vancomycin  (VANCOREADY) IVPB 1250 mg/250 mL  Status:  Discontinued        1,250 mg 166.7 mL/hr over 90 Minutes Intravenous Every 24 hours 03/04/24 2205 03/04/24 2224   03/05/24 1930  Vancomycin  (VANCOCIN ) 1,250 mg in sodium chloride  0.9 % 250 mL IVPB  Status:  Discontinued        1,250 mg 166.7 mL/hr  over 90 Minutes Intravenous Every 24 hours 03/04/24 2224 03/05/24 0717   03/05/24 1930  vancomycin  (VANCOREADY) IVPB 1500 mg/300 mL  Status:  Discontinued        1,500 mg 150 mL/hr over 120 Minutes Intravenous Every 24 hours 03/05/24 0717 03/12/24 1636   03/05/24 1400  ceFEPIme  (MAXIPIME ) 2 g in sodium chloride  0.9 % 100 mL IVPB  Status:  Discontinued        2 g 200 mL/hr over 30 Minutes Intravenous Every 8 hours 03/05/24 0717 03/12/24 1632   03/05/24 0600  ceFEPIme  (MAXIPIME ) 2 g in sodium chloride  0.9 % 100 mL IVPB  Status:  Discontinued        2 g 200 mL/hr over 30 Minutes Intravenous Every 12 hours 03/04/24 2205 03/05/24 0717   03/05/24 0000  metroNIDAZOLE  (FLAGYL ) IVPB 500 mg  Status:  Discontinued        500 mg 100 mL/hr over 60 Minutes Intravenous Every 12 hours 03/04/24 2206 03/04/24 2209   03/04/24 1730  aztreonam (AZACTAM) 2 g in sodium chloride  0.9  % 100 mL IVPB  Status:  Discontinued        2 g 200 mL/hr over 30 Minutes Intravenous  Once 03/04/24 1715 03/04/24 1724   03/04/24 1730  metroNIDAZOLE  (FLAGYL ) IVPB 500 mg  Status:  Discontinued        500 mg 100 mL/hr over 60 Minutes Intravenous Every 12 hours 03/04/24 1715 03/07/24 0840   03/04/24 1730  vancomycin  (VANCOREADY) IVPB 1500 mg/300 mL        1,500 mg 150 mL/hr over 120 Minutes Intravenous  Once 03/04/24 1715 03/04/24 2153   03/04/24 1730  ceFEPIme  (MAXIPIME ) 2 g in sodium chloride  0.9 % 100 mL IVPB        2 g 200 mL/hr over 30 Minutes Intravenous  Once 03/04/24 1724 03/04/24 1826         Subjective: Patient seen and examined at bedside.  Poor historian.  No fever, agitation, vomiting or seizures reported. Objective: Vitals:   07/10/24 1144 07/10/24 1718 07/11/24 0028 07/11/24 0600  BP: 118/66 115/72 124/63 119/76  Pulse: 74 77 74   Resp: 18 18    Temp: 97.8 F (36.6 C) 97.7 F (36.5 C) (!) 97.4 F (36.3 C) 97.8 F (36.6 C)  TempSrc:    Oral  SpO2: 97% 96% 97% 100%  Weight:      Height:       No intake or output data in the 24 hours ending 07/11/24 0750  Filed Weights   07/07/24 0500 07/08/24 0600 07/09/24 0320  Weight: 70.8 kg 71.7 kg 75.3 kg    Examination:  General: On room air.  No distress.  Chronically ill and deconditioned looking. ENT/neck: No thyromegaly.  JVD is not elevated  respiratory: Decreased breath sounds at bases bilaterally with some crackles; no wheezing  CVS: S1-S2 heard, rate controlled currently Abdominal: Soft, nontender, slightly distended; no organomegaly, bowel sounds are heard Extremities: Trace lower extremity edema; no cyanosis  CNS:; Still very slow to respond and a poor historian.  No focal deficits noted lymph: No obvious lymphadenopathy Skin: No obvious ecchymosis/lesions  psych: Flat affect.  Currently not agitated.     Data Reviewed: I have personally reviewed following labs and imaging studies  CBC: Recent  Labs  Lab 07/06/24 0847 07/08/24 0641 07/09/24 0433 07/10/24 0233  WBC 4.4 6.4 6.5 5.9  HGB 13.1 11.6* 11.6* 11.3*  HCT 40.2 34.1* 33.5* 33.1*  MCV 89.5 87.0 85.2  85.8  PLT 195 206 187 206   Basic Metabolic Panel: Recent Labs  Lab 07/04/24 1056 07/06/24 0847 07/08/24 0641 07/09/24 0433 07/10/24 0233  NA 140 142 140 141 143  K 4.5 4.2 4.3 3.7 3.5  CL 106 108 107 105 106  CO2 26 23 23 26 24   GLUCOSE 100* 106* 131* 91 157*  BUN 40* 28* 27* 28* 32*  CREATININE 1.04 0.88 0.84 0.87 0.78  CALCIUM  8.9 8.7* 8.7* 8.6* 8.4*  MG 2.0 1.9 2.1 1.9 1.9  PHOS  --   --  2.1* 2.3* 2.2*   GFR: Estimated Creatinine Clearance: 74.5 mL/min (by C-G formula based on SCr of 0.78 mg/dL). Liver Function Tests: Recent Labs  Lab 07/04/24 1056 07/06/24 0847 07/08/24 0641 07/09/24 0433 07/10/24 0233  AST 66* 55* 46* 51* 41  ALT 71* 60* 44 43 37  ALKPHOS 195* 200* 162* 152* 143*  BILITOT 1.6* 2.2* 2.5* 2.1* 2.0*  PROT 5.8* 5.8* 5.8* 5.5* 5.5*  ALBUMIN  2.6* 2.5* 3.1* 2.7* 3.1*   No results for input(s): LIPASE, AMYLASE in the last 168 hours. No results for input(s): AMMONIA in the last 168 hours. Coagulation Profile: No results for input(s): INR, PROTIME in the last 168 hours. Cardiac Enzymes: No results for input(s): CKTOTAL, CKMB, CKMBINDEX, TROPONINI in the last 168 hours. BNP (last 3 results) No results for input(s): PROBNP in the last 8760 hours. HbA1C: No results for input(s): HGBA1C in the last 72 hours. CBG: Recent Labs  Lab 07/09/24 1151 07/09/24 1700 07/10/24 0926 07/10/24 1143 07/10/24 1715  GLUCAP 175* 192* 95 146* 200*   Lipid Profile: No results for input(s): CHOL, HDL, LDLCALC, TRIG, CHOLHDL, LDLDIRECT in the last 72 hours. Thyroid  Function Tests: No results for input(s): TSH, T4TOTAL, FREET4, T3FREE, THYROIDAB in the last 72 hours. Anemia Panel: No results for input(s): VITAMINB12, FOLATE, FERRITIN, TIBC,  IRON, RETICCTPCT in the last 72 hours. Sepsis Labs: No results for input(s): PROCALCITON, LATICACIDVEN in the last 168 hours.  No results found for this or any previous visit (from the past 240 hours).       Radiology Studies: DG Swallowing Func-Speech Pathology Result Date: 07/09/2024 Table formatting from the original result was not included. Modified Barium Swallow Study Patient Details Name: Kojo Liby MRN: [id number] Date of Birth: June 06, 1941 Today's Date: 07/09/2024 HPI/PMH: HPI: Andros Channing is an 83 y.o. 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 He was discharged from the hospital (admitted for acute metabolic encephalopathy, hypotensive, hypoglycemic) to [facility] for rehab. He presented back to the hospital four days later on 03/04/24 with SOB and pain in sacral area. 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 SLP previously saw pt to assess and manage concerns for dysphagia. He was D/Cd on a pureed diet with thin liquids. SLP re-consulted on 05/02/24 for voice assessment; signed off on 8/4 with recs for OP ENT if warranted. New swallowing consult ordered 8/18 due to new onset coughing after eating. Clinical Impression: Clinical Impression: Pt presents with a mild pharyngeal dysphagia characterized by mistiming of the pharyngeal swallow, with thin liquids intermittently spilling into the larynx just before airway closure.  Aspiration was trace, inconsistent, and usually occurred with large boluses.  Chin tuck, straws had no impact on swallow physiology. All other aspects of the swallow were WFL, including  pharyngeal strength (no residue post-swallow).  Esophageal sweep revealed clearance of barium.  Pt swallowed a 13 mm barium pill  whole without difficulty. Recommend thickening liquids to nectar while pt is recovering from pna.  Spoke with his daughter, Murrell, re: results and plan; this info was communicated to RN and Dr. Trixie as well.  REC: dysphagia 1, nectar thick liquids. SLP will follow. Factors that may increase risk of adverse event in presence of aspiration Noe & Lianne 2021): Factors that may increase risk of adverse event in presence of aspiration Noe & Lianne 2021): Limited mobility; Frail or deconditioned; Poor general health and/or compromised immunity Recommendations/Plan: Swallowing Evaluation Recommendations Swallowing Evaluation Recommendations Recommendations: PO diet PO Diet Recommendation: Dysphagia 1 (Pureed); Mildly thick liquids (Level 2, nectar thick) Liquid Administration via: Cup; Straw Medication Administration: Whole meds with puree Supervision: Staff to assist with self-feeding Postural changes: Position pt fully upright for meals Oral care recommendations: Oral care BID (2x/day) Treatment Plan Treatment Plan Treatment recommendations: Therapy as outlined in treatment plan below Follow-up recommendations: No SLP follow up Functional status assessment: Patient has had a recent decline in their functional status and demonstrates the ability to make significant improvements in function in a reasonable and predictable amount of time. Treatment frequency: Min 2x/week Treatment duration: 2 weeks Interventions: Diet toleration management by SLP; Trials of upgraded texture/liquids; Patient/family education Recommendations Recommendations for follow up therapy are one component of a multi-disciplinary discharge planning process, led by the attending physician.  Recommendations may be updated based on patient status, additional functional criteria and insurance authorization. Assessment: Orofacial Exam: Orofacial Exam Oral Cavity: Oral Hygiene: WFL Oral Cavity - Dentition: Missing dentition Orofacial Anatomy: WFL  Oral Motor/Sensory Function: WFL Anatomy: Anatomy: WFL Boluses Administered: Boluses Administered Boluses Administered: Thin liquids (Level 0); Mildly thick liquids (Level 2, nectar thick); Moderately thick liquids (Level 3, honey thick); Puree  Oral Impairment Domain: Oral Impairment Domain Lip Closure: No labial escape Tongue control during bolus hold: Not tested Bolus preparation/mastication: -- (mechanical solids not tested) Initiation of pharyngeal swallow : Pyriform sinuses  Pharyngeal Impairment Domain: Pharyngeal Impairment Domain Soft palate elevation: No bolus between soft palate (SP)/pharyngeal wall (PW) Laryngeal elevation: Complete superior movement of thyroid  cartilage with complete approximation of arytenoids to epiglottic petiole Anterior hyoid excursion: Complete anterior movement Epiglottic movement: Complete inversion Laryngeal vestibule closure: Incomplete, narrow column air/contrast in laryngeal vestibule Pharyngeal stripping wave : Present - complete Pharyngeal contraction (A/P view only): N/A Pharyngoesophageal segment opening: Partial distention/partial duration, partial obstruction of flow Tongue base retraction: No contrast between tongue base and posterior pharyngeal wall (PPW) Pharyngeal residue: Complete pharyngeal clearance Location of pharyngeal residue: N/A  Esophageal Impairment Domain: Esophageal Impairment Domain Esophageal clearance upright position: Complete clearance, esophageal coating Pill: Pill Consistency administered: Puree Puree: WFL Penetration/Aspiration Scale Score: Penetration/Aspiration Scale Score 1.  Material does not enter airway: Mildly thick liquids (Level 2, nectar thick); Moderately thick liquids (Level 3, honey thick); Puree 7.  Material enters airway, passes BELOW cords and not ejected out despite cough attempt by patient: Thin liquids (Level 0) Compensatory Strategies: Compensatory Strategies Compensatory strategies: Yes Straw: Ineffective Ineffective Straw:  Thin liquid (Level 0) Chin tuck: Ineffective Ineffective Chin Tuck: Thin liquid (Level 0)   General Information: Caregiver present: No  Diet Prior to this Study: Dysphagia 1 (pureed); Thin liquids (Level 0)   Temperature : Normal   No data recorded  Supplemental O2: None (Room air)   History of Recent Intubation: No  Behavior/Cognition: Alert; Cooperative Self-Feeding Abilities:  Able to self-feed Baseline vocal quality/speech: Dysphonic Volitional Cough: Able to elicit Volitional Swallow: Able to elicit Exam Limitations: No limitations Goal Planning: Prognosis for improved oropharyngeal function: Good No data recorded No data recorded No data recorded No data recorded Pain: Pain Assessment Pain Assessment: Faces Faces Pain Scale: 2 Pain Location: generalized with repositioning. Pain Descriptors / Indicators: Discomfort; Grimacing Pain Intervention(s): Limited activity within patient's tolerance End of Session: Start Time:SLP Start Time (ACUTE ONLY): 0917 Stop Time: SLP Stop Time (ACUTE ONLY): 0947 Time Calculation:SLP Time Calculation (min) (ACUTE ONLY): 30 min Charges: SLP Evaluations $ SLP Speech Visit: 1 Visit SLP Evaluations $BSS Swallow: 1 Procedure $MBS Swallow: 1 Procedure SLP visit diagnosis: SLP Visit Diagnosis: Dysphagia, unspecified (R13.10) Past Medical History: Past Medical History: Diagnosis Date  Adenomatous colon polyp   Allergy   Anal fissure   Arthritis   BPH (benign prostatic hyperplasia)   Diabetes mellitus (HCC)   GERD (gastroesophageal reflux disease)   Hyperlipidemia   Hypertension   Hypothyroidism   Other testicular hypofunction   Stroke [facility])  Past Surgical History: Past Surgical History: Procedure Laterality Date  CIRCUMCISION N/A 06/11/2019  Procedure: CIRCUMCISION ADULT;  Surgeon: Nieves Cough, MD;  Location: [facility] ORS;  Service: Urology;  Laterality: N/A;  COLONOSCOPY    INGUINAL HERNIA REPAIR Bilateral   1968 and 2004  IR THORACENTESIS ASP PLEURAL SPACE W/IMG GUIDE  05/08/2024  IR  THORACENTESIS ASP PLEURAL SPACE W/IMG GUIDE  06/19/2024  IR THORACENTESIS ASP PLEURAL SPACE W/IMG GUIDE  07/02/2024  LOOP RECORDER INSERTION N/A 07/28/2017  Procedure: LOOP RECORDER INSERTION;  Surgeon: Fernande Elspeth BROCKS, MD;  Location: [facility] INVASIVE CV LAB;  Service: Cardiovascular;  Laterality: N/A;  RIGHT/LEFT HEART CATH AND CORONARY ANGIOGRAPHY N/A 10/07/2021  Procedure: RIGHT/LEFT HEART CATH AND CORONARY ANGIOGRAPHY;  Surgeon: Rolan Ezra RAMAN, MD;  Location: [facility] INVASIVE CV LAB;  Service: Cardiovascular;  Laterality: N/A;  TEE WITHOUT CARDIOVERSION N/A 07/28/2017  Procedure: TRANSESOPHAGEAL ECHOCARDIOGRAM (TEE);  Surgeon: Shlomo Wilbert SAUNDERS, MD;  Location: [facility] ENDOSCOPY;  Service: Cardiovascular;  Laterality: N/A;  THORACENTESIS Bilateral 06/03/2024  Procedure: MILANA;  Surgeon: Mannam, Praveen, MD;  Location: [facility];  Service: Cardiopulmonary;  Laterality: Bilateral; Vona Alan Bradford 07/09/2024, 10:12 AM       Scheduled Meds:  (feeding supplement) PROSource Plus  30 mL Oral BID BM   amiodarone   200 mg Oral Daily   apixaban   5 mg Oral BID   calcium  carbonate  1 tablet Oral TID   collagenase    Topical Daily   diclofenac  Sodium  4 g Topical QID   feeding supplement  237 mL Oral BID BM   ferrous sulfate   325 mg Oral QHS   fluticasone   1 spray Each Nare Daily   furosemide   40 mg Oral Daily   guaiFENesin   600 mg Oral BID   insulin  aspart  0-9 Units Subcutaneous TID WC   insulin  glargine-yfgn  5 Units Subcutaneous Daily   levothyroxine   50 mcg Oral Q0600   lidocaine   1 patch Transdermal Q24H   lidocaine -EPINEPHrine  (PF)  30 mL Infiltration Once   liver oil-zinc  oxide   Topical BID   loratadine   10 mg Oral Daily   metroNIDAZOLE   500 mg Oral Q12H   multivitamin with minerals  1 tablet Oral Daily   neomycin -bacitracin -polymyxin   Apply externally Daily   nutrition supplement (JUVEN)  1 packet Oral BID BM   mouth rinse  15 mL Mouth Rinse 4 times per day   pantoprazole   40 mg Oral  Daily    polyethylene glycol  17 g Oral BID   senna-docusate  1 tablet Oral QHS   sertraline   50 mg Oral Daily   sodium chloride  flush  10-40 mL Intracatheter Q12H   Tafamidis   1 capsule Oral Daily   vitamin A   10,000 Units Oral Daily   vutrisiran  sodium  25 mg Subcutaneous Q90 days   Continuous Infusions:  cefTRIAXone  (ROCEPHIN )  IV 2 g (07/10/24 1025)   dextrose             Sophie Mao, MD [facility] 07/11/2024, 7:50 AM

## 2024-07-12 DIAGNOSIS — A419 Sepsis, unspecified organism: Secondary | ICD-10-CM | POA: Diagnosis not present

## 2024-07-12 DIAGNOSIS — R6520 Severe sepsis without septic shock: Secondary | ICD-10-CM | POA: Diagnosis not present

## 2024-07-12 DIAGNOSIS — L89229 Pressure ulcer of left hip, unspecified stage: Secondary | ICD-10-CM | POA: Diagnosis not present

## 2024-07-12 LAB — GLUCOSE, CAPILLARY
Glucose-Capillary: 119 mg/dL — ABNORMAL HIGH (ref 70–99)
Glucose-Capillary: 206 mg/dL — ABNORMAL HIGH (ref 70–99)
Glucose-Capillary: 171 mg/dL — ABNORMAL HIGH (ref 70–99)
Glucose-Capillary: 190 mg/dL — ABNORMAL HIGH (ref 70–99)

## 2024-07-12 LAB — VITAMIN A: Vitamin A (Retinoic Acid): 8.6 ug/dL — ABNORMAL LOW (ref 22.0–69.5)

## 2024-07-12 NOTE — Progress Notes (Signed)
 Physical Therapy Wound Treatment Patient Details  Name: Earl [name] MRN: [id number] Date of Birth: 01-17-1941  Today's Date: 07/12/2024 Time: 8563-8485 Time Calculation (min): 38 min  Subjective  Subjective Assessment Subjective: Dr Trixie in room inquires about placement of wound vac. PT explained that there is not enough viable tissure for it to promote healing Patient and Family Stated Goals: Heal wounds Date of Onset:  (unknown) Prior Treatments: Dressing changes, off weighting  Pain Score:  Facial Pain 4/10 with turning in bed  Wound Assessment     Pressure Injury 03/06/24 Hip Anterior;Left;Proximal Unstageable - Full thickness tissue loss in which the base of the injury is covered by slough (yellow, tan, gray, green or brown) and/or eschar (tan, brown or black) in the wound bed. unstageable (Active)  Wound Image   07/12/24 1500  Dressing Type Foam - Lift dressing to assess site every shift;Gauze (Comment);Barrier Film (skin prep);Santyl ;Normal saline moist dressing 07/12/24 1500  Dressing Clean, Dry, Intact 07/12/24 1500  Dressing Change Frequency Daily 07/12/24 1500  State of Healing Non-healing 07/12/24 1500  Site / Wound Assessment Yellow;Friable;Brown;Dusky;Pink 07/12/24 1500  % Wound base Red or Granulating 10% 07/12/24 1500  % Wound base Yellow/Fibrinous Exudate 85% 07/12/24 1500  % Wound base Black/Eschar 0% 07/12/24 1500  % Wound base Other/Granulation Tissue (Comment) 5% 07/12/24 1500  Peri-wound Assessment Denuded;Induration;Pink;Erythema (non-blanchable) 07/12/24 1500  Wound Length (cm) 4 cm 07/09/24 1600  Wound Width (cm) 3 cm 07/09/24 1600  Wound Depth (cm) 4.3 cm 07/09/24 1600  Wound Surface Area (cm^2) 12 cm^2 07/09/24 1600  Wound Volume (cm^3) 51.6 cm^3 07/09/24 1600  Tunneling (cm) 3.4 cm at 3:00 07/09/24 1600  Undermining (cm) 12 o'clock 0.5, 1 o'clock 1.9 cm, 2 o'clock 2.1 cm, 6 o'clock 0.4, 9 o'clock 0.3cm 07/09/24 1600  Margins Unattached edges  (unapproximated) 07/12/24 1500  Drainage Amount Copious 07/12/24 1500  Drainage Description Odor - foul;Serous 07/12/24 1500  Treatment Cleansed;Debridement (Selective);Irrigation 07/12/24 1500      Selective Debridement (non-excisional) Selective Debridement (non-excisional) - Location: L hip Selective Debridement (non-excisional) - Tools Used: Forceps, Scissors Selective Debridement (non-excisional) - Tissue Removed: Slough & non-viable tissue    Wound Assessment and Plan  Wound Therapy - Assess/Plan/Recommendations Wound Therapy - Clinical Statement: More granulation tissue visable today in the base of the wound . Removed slough from superior posterior tunnel of the wound. Pt continues to benefit from selective debridement to reduce bioburden and promote wound healing. Wound Therapy - Functional Problem List: Decreased tolerance for position changes or OOB. Factors Delaying/Impairing Wound Healing: Diabetes Mellitus, Immobility, Infection - systemic/local, Multiple medical problems Hydrotherapy Plan: Debridement, Dressing change, Patient/family education Wound Therapy - Frequency: 2X / week Wound Therapy - Follow Up Recommendations: dressing changes by RN  Wound Therapy Goals- Improve the function of patient's integumentary system by progressing the wound(s) through the phases of wound healing (inflammation - proliferation - remodeling) by: Wound Therapy Goals - Improve the function of patient's integumentary system by progressing the wound(s) through the phases of wound healing by: Decrease Necrotic Tissue to: 20% Decrease Necrotic Tissue - Progress: Progressing toward goal Increase Granulation Tissue to: 80% Increase Granulation Tissue - Progress: Progressing toward goal Goals/treatment plan/discharge plan were made with and agreed upon by patient/family: Yes Time For Goal Achievement: 7 days Wound Therapy - Potential for Goals: Fair  Goals will be updated until maximal potential  achieved or discharge criteria met.  Discharge criteria: when goals achieved, discharge from hospital, MD decision/surgical intervention, no progress towards goals, refusal/missing  three consecutive treatments without notification or medical reason.  GP     Charges PT Wound Care Charges $Wound Debridement up to 20 cm: < or equal to 20 cm $PT Hydrotherapy Dressing: 2 dressings     Almarie B. [name] PT, DPT Acute Rehabilitation Services Please use secure chat or  Call Office [phone]   [name] [facility] 07/12/2024, 3:27 PM

## 2024-07-12 NOTE — Plan of Care (Signed)
  Problem: Fluid Volume: Goal: Hemodynamic stability will improve Outcome: Progressing   Problem: Clinical Measurements: Goal: Diagnostic test results will improve Outcome: Progressing Goal: Signs and symptoms of infection will decrease Outcome: Progressing   Problem: Education: Goal: Knowledge of risk factors and measures for prevention of condition will improve Outcome: Progressing   Problem: Coping: Goal: Psychosocial and spiritual needs will be supported Outcome: Progressing   Problem: Respiratory: Goal: Will maintain a patent airway Outcome: Progressing Goal: Complications related to the disease process, condition or treatment will be avoided or minimized Outcome: Progressing   Problem: Education: Goal: Ability to describe self-care measures that may prevent or decrease complications (Diabetes Survival Skills Education) will improve Outcome: Progressing   Problem: Coping: Goal: Ability to adjust to condition or change in health will improve Outcome: Progressing   Problem: Fluid Volume: Goal: Ability to maintain a balanced intake and output will improve Outcome: Progressing   Problem: Health Behavior/Discharge Planning: Goal: Ability to manage health-related needs will improve Outcome: Progressing   Problem: Metabolic: Goal: Ability to maintain appropriate glucose levels will improve Outcome: Progressing   Problem: Nutritional: Goal: Maintenance of adequate nutrition will improve Outcome: Progressing Goal: Progress toward achieving an optimal weight will improve Outcome: Progressing   Problem: Skin Integrity: Goal: Risk for impaired skin integrity will decrease Outcome: Progressing   Problem: Tissue Perfusion: Goal: Adequacy of tissue perfusion will improve Outcome: Progressing   Problem: Education: Goal: Knowledge of General Education information will improve Description: Including pain rating scale, medication(s)/side effects and non-pharmacologic  comfort measures Outcome: Progressing   Problem: Health Behavior/Discharge Planning: Goal: Ability to manage health-related needs will improve Outcome: Progressing   Problem: Clinical Measurements: Goal: Ability to maintain clinical measurements within normal limits will improve Outcome: Progressing Goal: Will remain free from infection Outcome: Progressing Goal: Diagnostic test results will improve Outcome: Progressing Goal: Respiratory complications will improve Outcome: Progressing Goal: Cardiovascular complication will be avoided Outcome: Progressing   Problem: Activity: Goal: Risk for activity intolerance will decrease Outcome: Progressing   Problem: Nutrition: Goal: Adequate nutrition will be maintained Outcome: Progressing   Problem: Coping: Goal: Level of anxiety will decrease Outcome: Progressing   Problem: Elimination: Goal: Will not experience complications related to bowel motility Outcome: Progressing Goal: Will not experience complications related to urinary retention Outcome: Progressing   Problem: Pain Managment: Goal: General experience of comfort will improve and/or be controlled Outcome: Progressing   Problem: Safety: Goal: Ability to remain free from injury will improve Outcome: Progressing   Problem: Skin Integrity: Goal: Risk for impaired skin integrity will decrease Outcome: Progressing

## 2024-07-12 NOTE — Consult Note (Signed)
 ORTHOPAEDIC CONSULTATION  REQUESTING PHYSICIAN: Cheryle Page, MD  Chief Complaint: Chronic decubitus ulcers.  HPI: Earl [name] is a 83 y.o. male who presents with chronic pressure ulcers including sacral ulcers, hip ulcers, and lower extremity ulcers.  Past Medical History:  Diagnosis Date   Adenomatous colon polyp    Allergy    Anal fissure    Arthritis    BPH (benign prostatic hyperplasia)    Diabetes mellitus (HCC)    GERD (gastroesophageal reflux disease)    Hyperlipidemia    Hypertension    Hypothyroidism    Other testicular hypofunction    Stroke [facility])    Past Surgical History:  Procedure Laterality Date   CIRCUMCISION N/A 06/11/2019   Procedure: CIRCUMCISION ADULT;  Surgeon: Nieves Cough, MD;  Location: [facility] OR[facility];  Service: Urology;  Laterality: N/A;   COLONOSCOPY     INGUINAL HERNIA REPAIR Bilateral    1968 and 2004   IR THORACENTESIS ASP PLEURAL SPACE W/IMG GUIDE  05/08/2024   IR THORACENTESIS ASP PLEURAL SPACE W/IMG GUIDE  06/19/2024   IR THORACENTESIS ASP PLEURAL SPACE W/IMG GUIDE  07/02/2024   LOOP RECORDER INSERTION N/A 07/28/2017   Procedure: LOOP RECORDER INSERTION;  Surgeon: Fernande Elspeth BROCKS, MD;  Location: [facility] INVASIVE CV LAB;  Service: Cardiovascular;  Laterality: N/A;   RIGHT/LEFT HEART CATH AND CORONARY ANGIOGRAPHY N/A 10/07/2021   Procedure: RIGHT/LEFT HEART CATH AND CORONARY ANGIOGRAPHY;  Surgeon: Rolan Ezra RAMAN, MD;  Location: [facility] INVASIVE CV LAB;  Service: Cardiovascular;  Laterality: N/A;   TEE WITHOUT CARDIOVERSION N/A 07/28/2017   Procedure: TRANSESOPHAGEAL ECHOCARDIOGRAM (TEE);  Surgeon: Shlomo Wilbert SAUNDERS, MD;  Location: [facility] ENDOSCOPY;  Service: Cardiovascular;  Laterality: N/A;   THORACENTESIS Bilateral 06/03/2024   Procedure: MILANA;  Surgeon: Mannam, Praveen, MD;  Location: [facility] ENDOSCOPY;  Service: Cardiopulmonary;  Laterality: Bilateral;   Social History   Socioeconomic History   Marital status: Widowed    Spouse name: Not on file    Number of children: 2   Years of education: Not on file   Highest education level: Bachelor's degree (e.g., BA, AB, BS)  Occupational History   Occupation: Academic librarian: RETIRED  Tobacco Use   Smoking status: Never   Smokeless tobacco: Never  Vaping Use   Vaping status: Never Used  Substance and Sexual Activity   Alcohol  use: No   Drug use: No   Sexual activity: Not on file  Other Topics Concern   Not on file  Social History Narrative   Right handed   Drinks caffeine  prn   One story home      Daughter names Para   Social Drivers of Health   Financial Resource Strain: Patient Declined (01/16/2024)   Overall Financial Resource Strain (CARDIA)    Difficulty of Paying Living Expenses: Patient declined  Food Insecurity: No Food Insecurity (03/05/2024)   Hunger Vital Sign    Worried About Running Out of Food in the Last Year: Never true    Ran Out of Food in the Last Year: Never true  Transportation Needs: No Transportation Needs (03/05/2024)   PRAPARE - Administrator, Civil Service (Medical): No    Lack of Transportation (Non-Medical): No  Physical Activity: Unknown (01/16/2024)   Exercise Vital Sign    Days of Exercise per Week: 0 days    Minutes of Exercise per Session: Not on file  Stress: Stress Concern Present (01/16/2024)   [employer] of Occupational Health - Occupational Stress  Questionnaire    Feeling of Stress : To some extent  Social Connections: Moderately Integrated (03/05/2024)   Social Connection and Isolation Panel    Frequency of Communication with Friends and Family: More than three times a week    Frequency of Social Gatherings with Friends and Family: Once a week    Attends Religious Services: More than 4 times per year    Active Member of [employer] or Organizations: Yes    Attends Banker Meetings: More than 4 times per year    Marital Status: Widowed   Family History  Problem Relation Age  of Onset   Sickle cell anemia Mother    Diabetes Father    Stomach cancer Father    Colon cancer Father    CAD Neg Hx    Esophageal cancer Neg Hx    Rectal cancer Neg Hx    - negative except otherwise stated in the family history section Allergies  Allergen Reactions   Penicillins Other (See Comments)    Did it involve swelling of the face/tongue/throat, SOB, or low BP? Unknown Did it involve sudden or severe rash/hives, skin peeling, or any reaction on the inside of your mouth or nose? Unknown Did you need to seek medical attention at a hospital or doctor's office? No When did it last happen?    Over 40 Years Ago   If all above answers are NO, may proceed with cephalosporin use.     Prior to Admission medications   Medication Sig Start Date End Date Taking? Authorizing Provider  Amino Acids -Protein Hydrolys (PRO-STAT AWC) LIQD Take 30 mLs by mouth in the morning and at bedtime.   Yes [provider]  atorvastatin  (LIPITOR) 40 MG tablet Take  1 tablet  Daily for Cholesterol                                                       /                                  TAKE                                                       BY                                           MOUTH 01/11/24  Yes Webb, Padonda B, FNP  Cholecalciferol  (VITAMIN D  PO) Take 1 tablet by mouth daily.    Yes [provider]  Cyanocobalamin  (B-12) 50 MCG TABS Take 50 mcg by mouth daily.   Yes [provider]  ELIQUIS  5 MG TABS tablet TAKE 1 TABLET BY MOUTH TWICE  DAILY TO PREVENT BLOOD CLOTS 11/09/23  Yes Wilkinson, Dana E, NP  Emollient (AQUAPHOR ADV PROTECT HEALING) OINT Apply small amount to two areas on buttocks daily to prevent breakdown 07/19/23  Yes Wilkinson, Dana E, NP  ferrous sulfate  325 (65 FE) MG tablet Take 325 mg by mouth at  bedtime.   Yes [provider]  finasteride  (PROSCAR ) 5 MG tablet TAKE 1 TABLET BY MOUTH DAILY FOR PROSTATE 09/15/23  Yes Wilkinson, Dana E, NP   levothyroxine  (SYNTHROID ) 50 MCG tablet TAKE 1 TABLET BY MOUTH DAILY ON  AN EMPTY STOMACH WITH ONLY WATER FOR 30 MINUTES AND NO ANTACID,  MEDS, CALCIUM  OR MAGNESIUM  FOR 4 HOURS AND AVOID BIOTIN Patient taking differently: Take 50 mcg by mouth daily before breakfast. ON AN EMPTY STOMACH WITH ONLY WATER FOR 30 MINUTES AND NO ANTACID,  MEDS, CALCIUM  OR MAGNESIUM  FOR 4 HOURS AND AVOID BIOTIN 05/23/23  Yes Wilkinson, Dana E, NP  Magnesium  250 MG TABS Take 250 mg by mouth daily.    Yes [provider]  metFORMIN  (GLUCOPHAGE -XR) 500 MG 24 hr tablet TAKE 2 TABLETS BY MOUTH TWICE  DAILY WITH MEALS FOR DIABETES 07/31/23  Yes Wilkinson, Dana E, NP  metoprolol  succinate (TOPROL -XL) 25 MG 24 hr tablet Take 1 tablet (25 mg total) by mouth at bedtime. 02/29/24 03/30/24 Yes Vernon Ranks, MD  Multiple Vitamin (MULTIVITAMIN) capsule Take 1 capsule by mouth daily.   Yes [provider]  Nutritional Supplements (BOOST GLUCOSE CONTROL) LIQD Take 237 mLs by mouth 2 (two) times daily after a meal.   Yes [provider]  potassium chloride  SA (KLOR-CON  M20) 20 MEQ tablet Take 1 tablet by mouth once daily 09/25/23  Yes Milford, Jessica M, FNP  sacubitril -valsartan  (ENTRESTO ) 24-26 MG Take 1 tablet by mouth 2 (two) times daily. 01/31/24  Yes Bensimhon, Toribio SAUNDERS, MD  vitamin C  (ASCORBIC ACID ) 500 MG tablet Take 500 mg by mouth daily.   Yes [provider]  AMVUTTRA  25 MG/0.5ML syringe INJECT 25MG  (0.5ML) SUBCUTANEOUSLY ONCE EVERY 3 MONTHS. ROTATE ADMINISTRATION SITE WITH EACH INJECTION. *REFRIGERATE. DO NOT SHAKE.* Patient not taking: Reported on 03/04/2024 10/24/23   Rolan Ezra RAMAN, MD  Blood Glucose Monitoring Suppl (ONE TOUCH ULTRA 2) w/Device KIT Check blood sugar 1 time a day-DX-E11.9 03/23/23   Tonita Fallow, MD  glucose blood [facility] ULTRA) test strip Use as instructed 03/23/23   Tonita Fallow, MD  Lancets [facility] DELICA PLUS [place]) MISC CHECK BLOOD SUGAR ONCE A DAY. 03/23/23    Tonita Fallow, MD  Lancets Misc. (ONE TOUCH SURESOFT) MISC Check blood sugar 1 time a day-DX-E11.91 03/23/23   Tonita Fallow, MD  NONFORMULARY OR COMPOUNDED ITEM Please dispense a Rolling Walker.  Dx:R26.81 & G63 12/08/22   Patel, Donika K, DO  Tafamidis  (VYNDAMAX ) 61 MG CAPS Take 1 capsule by mouth daily. 03/29/24   Rolan Ezra RAMAN, MD   No results found. - pertinent xrays, CT, MRI studies were reviewed and independently interpreted  Positive ROS: All other systems have been reviewed and were otherwise negative with the exception of those mentioned in the HPI and as above.  Physical Exam: General: Alert, no acute distress Psychiatric: Patient is competent for consent with normal mood and affect Lymphatic: No axillary or cervical lymphadenopathy Cardiovascular: No pedal edema Respiratory: No cyanosis, no use of accessory musculature GI: No organomegaly, abdomen is soft and non-tender    Images:  @ENCIMAGES @  Labs:  Lab Results  Component Value Date   HGBA1C 8.4 (H) 02/22/2024   HGBA1C 6.8 (H) 10/31/2023   HGBA1C 6.4 (H) 07/19/2023   ESRSEDRATE 10 01/18/2007   CRP 2.4 (H) 07/08/2024   LABURIC 5.0 07/07/2021   REPTSTATUS 06/07/2024 FINAL 06/03/2024   GRAMSTAIN NO WBC SEEN NO ORGANISMS SEEN  06/03/2024   CULT  06/03/2024  NO GROWTH 3 DAYS Performed at [facility], [address] [place], [place] [address]    LABORGA PSEUDOMONAS AERUGINOSA 04/24/2024   LABORGA KLEBSIELLA PNEUMONIAE 04/24/2024    Lab Results  Component Value Date   ALBUMIN  3.1 (L) 07/10/2024   ALBUMIN  2.7 (L) 07/09/2024   ALBUMIN  3.1 (L) 07/08/2024   PREALBUMIN <5 (L) 07/01/2024   PREALBUMIN <5 (L) 04/03/2024   LABURIC 5.0 07/07/2021        Latest Ref Rng & Units 07/10/2024    2:33 AM 07/09/2024    4:33 AM 07/08/2024    6:41 AM  CBC EXTENDED  WBC 4.0 - 10.5 K/uL 5.9  6.5  6.4   RBC 4.22 - 5.81 MIL/uL 3.86  3.93  3.92   Hemoglobin 13.0 - 17.0 g/dL 88.6  88.3  88.3   HCT 39.0 -  52.0 % 33.1  33.5  34.1   Platelets 150 - 400 K/uL 206  187  206     Neurologic: Patient does not have protective sensation bilateral lower extremities.   MUSCULOSKELETAL:   Skin: Examination of the sacral ulcer patient has undermining of the chronic ulcer.  There is flat tissue over the bone with no exposed bone.  Review of the CT scan shows the deep decubitus ulcer but no destructive bony changes of the greater trochanter.  Prealbumin less than 5.  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  Plan: Plan: Would continue with packing the wound with 4 x 4 gauze soaked with Vashe.  No surgical intervention for the left hip indicated.  Thank you for the consult and the opportunity to see Mr. Birge  Laren Whaling, MD [facility] [phone] 1:31 PM

## 2024-07-12 NOTE — Progress Notes (Signed)
 PROGRESS NOTE    Earl [name]  FMW:[id number] DOB: 1941/11/11 DOA: 03/04/2024 PCP: [name], MD   Brief Narrative:  83 year old M with PMH of PAF on Eliquis , HFmrEF, amyloid cardiomyopathy, NIDDM-2, CVA with right-sided weakness, stage IV sacral wound, pancytopenia, MGUS, HTN, HLD and hypothyroidism brought to ED by EMS from [facility] rehab due to pain in sacral area and shortness of breath 4 days after he was discharged from the hospital.  Patient was hospitalized from 3/26-4/10 with generalized weakness, encephalopathy and COVID-19 infection.  Per daughter, patient sustained skin injury during this hospitalization, and he had significant pain, discomfort and progressive foul-smelling discharge from sacral wound over the course of 4 to 5 days before coming back to ED. he was admitted due to persistent wounds.  General surgery, orthopedic surgery, ID all consulted.   See plan and assessment for more on hospital course.  Significant imaging / results / micro data: 4/14-COVID-19 PCR positive.  Flu and RSV PCR nonreactive. 4/14-blood cultures NGTD 6/4-sacral wound culture with Pseudomonas aeruginosa and Klebsiella pneumoniae 7/14-pleural fluid culture NGTD  Assessment & Plan:   Stage IV sacral decubitus wound/ulcer with severe sepsis on admission  - Culture data as above. -4/14-CT showed sacral wound extension to the right gluteus muscle without signs of osteomyelitis. -4/15-underwent bedside debridement of sacral decubitus wound by general surgery -4/21-ID consulted and recommended continuing broad-spectrum antibiotics that he completed on 6/13. 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 -6/17- ID recommended continuing holding off antibiotics since he is hemodynamically stable, afebrile without leukocytosis. -6/17-evaluated by plastic surgery.  Sacral wound felt to be clean.  Wound care  recommended -7/13-7/13-CT showed similar appearance of osteomyelitis underlying sacral decubitus ulcer.  -7/13-plastic surgery recommended for continued wound care and no surgical management. -7/14-Ortho evaluated patient for left hip wound.  Debulked and recommended optimizing nutrition -8/5- Surgery recommended hydrotherapy for left hip wound. Surgical debridement if not successful -8/5-wound care and instruction updated  -8/15-left hip wound worse with tunneling. Daughter concerned about this.  X-ray with left lateral hip soft tissue defect.  No acute bony abnormality.  -Overall, difficult case due to patient's nutritional status and limited mobility.  Prealbumin <5. - CT scan of the hip 8/15 with new left hip wound which extends deep, 2.8 cm without evidence of acute osteolysis involving the underlying left greater trochanter.  Continue wound care as per wound care consult recommendations. - The pads on top of the heel wound are not sticking well due to dry and flaky skin.  Discussed with the bedside RN to put the foam pads and then wrap the foot and ankle to prevent them from sliding follow wound care consult recommendations.  Severe sepsis-present on admission.  Resolved.   Aspiration pneumonia-patient with worsening congestion, increased cough on 8/18.  Chest x-ray 8/18 with lower lobes collapsed and possible underlying pneumonia, likely due to being in bed for prolonged amount of time.  With worsening symptoms favor doing a 5-day course of antibiotics with ceftriaxone  and metronidazole  (he is pen allergic).  Today is day 5/5 - Diet as per SLP's recommendations   Acute on chronic combined CHF, cardiac amyloidosis, nonischemic cardiomyopathy, hypotension - on tafamidis  and vutrisiran  for amyloid.  Goal-directed medical therapy somewhat limited by prior episodes of hypotension.   - Has received few doses of IV Lasix  recently along with IV albumin .  Currently on oral Lasix . -Continue Tafamidis   and vutrisiran . -Ace wrap for BLE edema -Strict intake and output, daily weights,  renal functions and electrolytes   Elevated LFTs  - Improving.  Lipitor still on hold.  Monitor intermittently.   Acute on chronic pain-continue current pain management with lidocaine  patch and as needed oxycodone    PAF-continue amiodarone  and Eliquis .  Currently rate controlled.  No bleeding   Anemia of chronic disease-from chronic illnesses.  Monitor intermittently.  No signs of bleeding.  Hemoglobin currently stable.  History of CVA-stable, on Eliquis , holding Lipitor due to LFTs   Concern for acute cholecystitis-ruled out, right upper quadrant ultrasound initially raise concern, however HIDA scan was negative.  This is likely due to anasarca/third spacing.     Dysphagia/severe protein calorie malnutrition-continue supplementation, pured diet.  Follow-up SLP and nutrition recommendations   Physical debilitation, muscle weakness-PT/OT ongoing   Goals of care - Daughter wants him to transition to home once his wounds are better.  Prior hospitalist discussed with daughter on 07/07/24 that his wounds might not improve.  Daughter does not want SNF or LTAC.  Palliative care following intermittently.  Patient is DNR.  hypothyroidism -Continue levothyroxine    Anxiety and adjustment disorder -Psychiatry consulted on 7/7 and recommended continuing Zoloft .  Psychiatry has signed off.   IDDM -continue Semglee , sliding scale, Accu-Cheks   Multiple pressure skin injuries: Present on admission.  Follow wound care consult recommendations. Pressure Injury 02/26/24 Coccyx Mid Stage 4 - Full thickness tissue loss with exposed bone, tendon or muscle. (Active)  02/26/24 0916  Location: Coccyx  Location Orientation: Mid  Staging: Stage 4 - Full thickness tissue loss with exposed bone, tendon or muscle.  Wound Description (Comments):   DO NOT USE:  Present on Admission: Yes     Pressure Injury 02/26/24 Toe (Comment   which one) Anterior;Right Deep Tissue Pressure Injury - Purple or maroon localized area of discolored intact skin or blood-filled blister due to damage of underlying soft tissue from pressure and/or shear. right gre (Active)  02/26/24 1215  Location: Toe (Comment  which one)  Location Orientation: Anterior;Right  Staging: Deep Tissue Pressure Injury - Purple or maroon localized area of discolored intact skin or blood-filled blister due to damage of underlying soft tissue from pressure and/or shear.  Wound Description (Comments): right great toe  DO NOT USE:  Present on Admission:      Pressure Injury 03/06/24 Hip Anterior;Left;Proximal Unstageable - Full thickness tissue loss in which the base of the injury is covered by slough (yellow, tan, gray, green or brown) and/or eschar (tan, brown or black) in the wound bed. unstageable (Active)  03/06/24 1612  Location: Hip  Location Orientation: Anterior;Left;Proximal  Staging: Unstageable - Full thickness tissue loss in which the base of the injury is covered by slough (yellow, tan, gray, green or brown) and/or eschar (tan, brown or black) in the wound bed.  Wound Description (Comments): unstageable  DO NOT USE:  Present on Admission: No     Pressure Injury 03/06/24 Shoulder Left Unstageable - Full thickness tissue loss in which the base of the injury is covered by slough (yellow, tan, gray, green or brown) and/or eschar (tan, brown or black) in the wound bed. unstageable (Active)  03/06/24 1613  Location: Shoulder  Location Orientation: Left  Staging: Unstageable - Full thickness tissue loss in which the base of the injury is covered by slough (yellow, tan, gray, green or brown) and/or eschar (tan, brown or black) in the wound bed.  Wound Description (Comments): unstageable  DO NOT USE:  Present on Admission: No     Pressure Injury  04/23/24 Heel Right Unstageable - Full thickness tissue loss in which the base of the injury is covered by slough  (yellow, tan, gray, green or brown) and/or eschar (tan, brown or black) in the wound bed. DTPI evolving into unstageable (Active)  04/23/24   Location: Heel  Location Orientation: Right  Staging: Unstageable - Full thickness tissue loss in which the base of the injury is covered by slough (yellow, tan, gray, green or brown) and/or eschar (tan, brown or black) in the wound bed.  Wound Description (Comments): DTPI evolving into unstageable  DO NOT USE:  Present on Admission: No     Pressure Injury 04/23/24 Heel Left Deep Tissue Pressure Injury - Purple or maroon localized area of discolored intact skin or blood-filled blister due to damage of underlying soft tissue from pressure and/or shear. (Active)  04/23/24   Location: Heel  Location Orientation: Left  Staging: Deep Tissue Pressure Injury - Purple or maroon localized area of discolored intact skin or blood-filled blister due to damage of underlying soft tissue from pressure and/or shear.  Wound Description (Comments):   DO NOT USE:  Present on Admission: No       DVT prophylaxis: Eliquis  Code Status: DNR Family Communication: None at bedside Disposition Plan: Status is: Inpatient Remains inpatient appropriate because: Of severity of illness  Consultants:  PCCM General Surgery Cardiology Infectious disease Plastic surgery Orthopedic surgery Palliative medicine  Procedures: As above  Antimicrobials:  Anti-infectives (From admission, onward)    Start     Dose/Rate Route Frequency Ordered Stop   07/08/24 1045  metroNIDAZOLE  (FLAGYL ) tablet 500 mg        500 mg Oral Every 12 hours 07/08/24 0951 07/13/24 0959   07/08/24 1045  cefTRIAXone  (ROCEPHIN ) 2 g in sodium chloride  0.9 % 100 mL IVPB        2 g 200 mL/hr over 30 Minutes Intravenous Every 24 hours 07/08/24 0951 07/13/24 1044   04/27/24 1615  cefTAZidime  (FORTAZ ) 2 g in sodium chloride  0.9 % 100 mL IVPB        2 g 200 mL/hr over 30 Minutes Intravenous Every 8 hours  04/27/24 1518 05/03/24 2359   03/16/24 1015  cefTRIAXone  (ROCEPHIN ) 2 g in sodium chloride  0.9 % 100 mL IVPB        2 g 200 mL/hr over 30 Minutes Intravenous Every 24 hours 03/16/24 0929 03/20/24 1219   03/16/24 1015  doxycycline  (VIBRA -TABS) tablet 100 mg        100 mg Oral Every 12 hours 03/16/24 0929 03/20/24 2131   03/14/24 1000  metroNIDAZOLE  (FLAGYL ) tablet 500 mg        500 mg Oral Every 12 hours 03/14/24 0803 03/19/24 2219   03/13/24 1000  cefadroxil  (DURICEF) capsule 1,000 mg  Status:  Discontinued        1,000 mg Oral 2 times daily 03/12/24 1632 03/16/24 0929   03/07/24 2200  metroNIDAZOLE  (FLAGYL ) tablet 500 mg        500 mg Oral Every 12 hours 03/07/24 0840 03/11/24 2258   03/05/24 1930  vancomycin  (VANCOREADY) IVPB 1250 mg/250 mL  Status:  Discontinued        1,250 mg 166.7 mL/hr over 90 Minutes Intravenous Every 24 hours 03/04/24 2205 03/04/24 2224   03/05/24 1930  Vancomycin  (VANCOCIN ) 1,250 mg in sodium chloride  0.9 % 250 mL IVPB  Status:  Discontinued        1,250 mg 166.7 mL/hr over 90 Minutes Intravenous Every 24 hours 03/04/24 2224  03/05/24 0717   03/05/24 1930  vancomycin  (VANCOREADY) IVPB 1500 mg/300 mL  Status:  Discontinued        1,500 mg 150 mL/hr over 120 Minutes Intravenous Every 24 hours 03/05/24 0717 03/12/24 1636   03/05/24 1400  ceFEPIme  (MAXIPIME ) 2 g in sodium chloride  0.9 % 100 mL IVPB  Status:  Discontinued        2 g 200 mL/hr over 30 Minutes Intravenous Every 8 hours 03/05/24 0717 03/12/24 1632   03/05/24 0600  ceFEPIme  (MAXIPIME ) 2 g in sodium chloride  0.9 % 100 mL IVPB  Status:  Discontinued        2 g 200 mL/hr over 30 Minutes Intravenous Every 12 hours 03/04/24 2205 03/05/24 0717   03/05/24 0000  metroNIDAZOLE  (FLAGYL ) IVPB 500 mg  Status:  Discontinued        500 mg 100 mL/hr over 60 Minutes Intravenous Every 12 hours 03/04/24 2206 03/04/24 2209   03/04/24 1730  aztreonam (AZACTAM) 2 g in sodium chloride  0.9 % 100 mL IVPB  Status:  Discontinued         2 g 200 mL/hr over 30 Minutes Intravenous  Once 03/04/24 1715 03/04/24 1724   03/04/24 1730  metroNIDAZOLE  (FLAGYL ) IVPB 500 mg  Status:  Discontinued        500 mg 100 mL/hr over 60 Minutes Intravenous Every 12 hours 03/04/24 1715 03/07/24 0840   03/04/24 1730  vancomycin  (VANCOREADY) IVPB 1500 mg/300 mL        1,500 mg 150 mL/hr over 120 Minutes Intravenous  Once 03/04/24 1715 03/04/24 2153   03/04/24 1730  ceFEPIme  (MAXIPIME ) 2 g in sodium chloride  0.9 % 100 mL IVPB        2 g 200 mL/hr over 30 Minutes Intravenous  Once 03/04/24 1724 03/04/24 1826         Subjective: Patient seen and examined at bedside.  Poor historian.  No seizures, vomiting, agitation or fever reported.   Objective: Vitals:   07/11/24 2121 07/12/24 0029 07/12/24 0644 07/12/24 0741  BP: 125/74 133/61 105/86 109/60  Pulse: 80 70 73 73  Resp:      Temp: (!) 97.4 F (36.3 C) (!) 97.4 F (36.3 C) 97.7 F (36.5 C) 98.4 F (36.9 C)  TempSrc:    Oral  SpO2: 98% 100%  95%  Weight:      Height:        Intake/Output Summary (Last 24 hours) at 07/12/2024 0751 Last data filed at 07/11/2024 1543 Gross per 24 hour  Intake --  Output 800 ml  Net -800 ml    Filed Weights   07/07/24 0500 07/08/24 0600 07/09/24 0320  Weight: 70.8 kg 71.7 kg 75.3 kg    Examination:  General: No acute distress.  Currently on room air.  Chronically ill and deconditioned looking. ENT/neck: No neck masses or elevated JVD noted  respiratory: Decreased breath sounds at bases with scattered crackles. CVS: Rate mostly controlled; S1 and S2 heard  abdominal: Soft, nontender, distended mildly; no organomegaly, bowel sounds are heard normally Extremities: No clubbing; mild lower extremity edema present CNS: Extremely slow to respond and a very poor historian.  No obvious focal deficits noted  lymph: No obvious palpable lymphadenopathy Skin: No obvious petechia/lesions psych: Affect is extremely flat.  Currently showing no  signs of agitation    Data Reviewed: I have personally reviewed following labs and imaging studies  CBC: Recent Labs  Lab 07/06/24 0847 07/08/24 9358 07/09/24 0433 07/10/24 0233  WBC 4.4 6.4 6.5 5.9  HGB 13.1 11.6* 11.6* 11.3*  HCT 40.2 34.1* 33.5* 33.1*  MCV 89.5 87.0 85.2 85.8  PLT 195 206 187 206   Basic Metabolic Panel: Recent Labs  Lab 07/06/24 0847 07/08/24 0641 07/09/24 0433 07/10/24 0233  NA 142 140 141 143  K 4.2 4.3 3.7 3.5  CL 108 107 105 106  CO2 23 23 26 24   GLUCOSE 106* 131* 91 157*  BUN 28* 27* 28* 32*  CREATININE 0.88 0.84 0.87 0.78  CALCIUM  8.7* 8.7* 8.6* 8.4*  MG 1.9 2.1 1.9 1.9  PHOS  --  2.1* 2.3* 2.2*   GFR: Estimated Creatinine Clearance: 74.5 mL/min (by C-G formula based on SCr of 0.78 mg/dL). Liver Function Tests: Recent Labs  Lab 07/06/24 0847 07/08/24 0641 07/09/24 0433 07/10/24 0233  AST 55* 46* 51* 41  ALT 60* 44 43 37  ALKPHOS 200* 162* 152* 143*  BILITOT 2.2* 2.5* 2.1* 2.0*  PROT 5.8* 5.8* 5.5* 5.5*  ALBUMIN  2.5* 3.1* 2.7* 3.1*   No results for input(s): LIPASE, AMYLASE in the last 168 hours. No results for input(s): AMMONIA in the last 168 hours. Coagulation Profile: No results for input(s): INR, PROTIME in the last 168 hours. Cardiac Enzymes: No results for input(s): CKTOTAL, CKMB, CKMBINDEX, TROPONINI in the last 168 hours. BNP (last 3 results) No results for input(s): PROBNP in the last 8760 hours. HbA1C: No results for input(s): HGBA1C in the last 72 hours. CBG: Recent Labs  Lab 07/10/24 1715 07/11/24 0800 07/11/24 1120 07/11/24 1613 07/11/24 2124  GLUCAP 200* 102* 131* 94 126*   Lipid Profile: No results for input(s): CHOL, HDL, LDLCALC, TRIG, CHOLHDL, LDLDIRECT in the last 72 hours. Thyroid  Function Tests: No results for input(s): TSH, T4TOTAL, FREET4, T3FREE, THYROIDAB in the last 72 hours. Anemia Panel: No results for input(s): VITAMINB12, FOLATE,  FERRITIN, TIBC, IRON, RETICCTPCT in the last 72 hours. Sepsis Labs: No results for input(s): PROCALCITON, LATICACIDVEN in the last 168 hours.  No results found for this or any previous visit (from the past 240 hours).       Radiology Studies: No results found.       Scheduled Meds:  (feeding supplement) PROSource Plus  30 mL Oral BID BM   amiodarone   200 mg Oral Daily   apixaban   5 mg Oral BID   calcium  carbonate  1 tablet Oral TID   collagenase    Topical Daily   diclofenac  Sodium  4 g Topical QID   feeding supplement  237 mL Oral BID BM   ferrous sulfate   325 mg Oral QHS   fluticasone   1 spray Each Nare Daily   furosemide   40 mg Oral Daily   guaiFENesin   600 mg Oral BID   insulin  aspart  0-9 Units Subcutaneous TID WC   insulin  glargine-yfgn  5 Units Subcutaneous Daily   levothyroxine   50 mcg Oral Q0600   lidocaine   1 patch Transdermal Q24H   lidocaine -EPINEPHrine  (PF)  30 mL Infiltration Once   liver oil-zinc  oxide   Topical BID   loratadine   10 mg Oral Daily   metroNIDAZOLE   500 mg Oral Q12H   multivitamin with minerals  1 tablet Oral Daily   neomycin -bacitracin -polymyxin   Apply externally Daily   nutrition supplement (JUVEN)  1 packet Oral BID BM   mouth rinse  15 mL Mouth Rinse 4 times per day   pantoprazole   40 mg Oral Daily   polyethylene glycol  17 g Oral BID  senna-docusate  1 tablet Oral QHS   sertraline   50 mg Oral Daily   sodium chloride  flush  10-40 mL Intracatheter Q12H   Tafamidis   1 capsule Oral Daily   vitamin A   10,000 Units Oral Daily   vutrisiran  sodium  25 mg Subcutaneous Q90 days   Continuous Infusions:  cefTRIAXone  (ROCEPHIN )  IV 2 g (07/11/24 1102)   dextrose             Litisha Guagliardo Cheryle, MD [facility] 07/12/2024, 7:51 AM

## 2024-07-13 DIAGNOSIS — R6520 Severe sepsis without septic shock: Secondary | ICD-10-CM | POA: Diagnosis not present

## 2024-07-13 DIAGNOSIS — A419 Sepsis, unspecified organism: Secondary | ICD-10-CM | POA: Diagnosis not present

## 2024-07-13 LAB — GLUCOSE, CAPILLARY
Glucose-Capillary: 83 mg/dL (ref 70–99)
Glucose-Capillary: 145 mg/dL — ABNORMAL HIGH (ref 70–99)
Glucose-Capillary: 236 mg/dL — ABNORMAL HIGH (ref 70–99)
Glucose-Capillary: 156 mg/dL — ABNORMAL HIGH (ref 70–99)

## 2024-07-13 NOTE — Progress Notes (Signed)
 PROGRESS NOTE    Earl [name]  FMW:[id number] DOB: [birth year] DOA: 03/04/2024 PCP: [name], MD   Brief Narrative:  83 year old M with PMH of PAF on Eliquis , HFmrEF, amyloid cardiomyopathy, NIDDM-2, CVA with right-sided weakness, stage IV sacral wound, pancytopenia, MGUS, HTN, HLD and hypothyroidism brought to ED by EMS from [facility] rehab due to pain in sacral area and shortness of breath 4 days after he was discharged from the hospital.  Patient was hospitalized from 3/26-4/10 with generalized weakness, encephalopathy and COVID-19 infection.  Per daughter, patient sustained skin injury during this hospitalization, and he had significant pain, discomfort and progressive foul-smelling discharge from sacral wound over the course of 4 to 5 days before coming back to ED. he was admitted due to persistent wounds.  General surgery, orthopedic surgery, ID all consulted.   See plan and assessment for more on hospital course.  Significant imaging / results / micro data: 4/14-COVID-19 PCR positive.  Flu and RSV PCR nonreactive. 4/14-blood cultures NGTD 6/4-sacral wound culture with Pseudomonas aeruginosa and Klebsiella pneumoniae 7/14-pleural fluid culture NGTD  Assessment & Plan:   Stage IV sacral decubitus wound/ulcer with severe sepsis on admission  - Culture data as above. -4/14-CT showed sacral wound extension to the right gluteus muscle without signs of osteomyelitis. -4/15-underwent bedside debridement of sacral decubitus wound by general surgery -4/21-ID consulted and recommended continuing broad-spectrum antibiotics that he completed on 6/13. 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 -6/17- ID recommended continuing holding off antibiotics since he is hemodynamically stable, afebrile without leukocytosis. -6/17-evaluated by plastic surgery.  Sacral wound felt to be clean.  Wound care  recommended -7/13-7/13-CT showed similar appearance of osteomyelitis underlying sacral decubitus ulcer.  -7/13-plastic surgery recommended for continued wound care and no surgical management. -7/14-Ortho evaluated patient for left hip wound.  Debulked and recommended optimizing nutrition -8/5- Surgery recommended hydrotherapy for left hip wound. Surgical debridement if not successful -8/5-wound care and instruction updated  -8/15-left hip wound worse with tunneling. Daughter concerned about this.  X-ray with left lateral hip soft tissue defect.  No acute bony abnormality.  -Overall, difficult case due to patient's nutritional status and limited mobility.  Prealbumin <5. - CT scan of the hip 8/15 with new left hip wound which extends deep, 2.8 cm without evidence of acute osteolysis involving the underlying left greater trochanter.  Ortho was reconsulted on 07/12/2024: Recommended conservative management.  Continue wound care as per wound care consult recommendations. - The pads on top of the heel wound are not sticking well due to dry and flaky skin.  Discussed with the bedside RN to put the foam pads and then wrap the foot and ankle to prevent them from sliding follow wound care consult recommendations.  Severe sepsis-present on admission.  Resolved.   Aspiration pneumonia-patient with worsening congestion, increased cough on 8/18.  Chest x-ray 8/18 with lower lobes collapsed and possible underlying pneumonia, likely due to being in bed for prolonged amount of time.  Completed 5 days of Rocephin  and Flagyl  on 07/12/2024. - Diet as per SLP's recommendations   Acute on chronic combined CHF, cardiac amyloidosis, nonischemic cardiomyopathy, hypotension - on tafamidis  and vutrisiran  for amyloid.  Goal-directed medical therapy somewhat limited by prior episodes of hypotension.   - Has received few doses of IV Lasix  recently along with IV albumin .  Currently on oral Lasix . -Continue Tafamidis  and  vutrisiran . -Ace wrap for BLE edema -Strict intake and output, daily weights, renal functions and electrolytes  Elevated LFTs  - Improving.  Lipitor still on hold.  Monitor intermittently.   Acute on chronic pain-continue current pain management with lidocaine  patch and as needed oxycodone    PAF-continue amiodarone  and Eliquis .  Currently rate controlled.  No bleeding   Anemia of chronic disease-from chronic illnesses.  Monitor intermittently.  No signs of bleeding.  Hemoglobin currently stable.  History of CVA-stable, on Eliquis , holding Lipitor due to LFTs   Concern for acute cholecystitis-ruled out, right upper quadrant ultrasound initially raise concern, however HIDA scan was negative.  This is likely due to anasarca/third spacing.     Dysphagia/severe protein calorie malnutrition-continue supplementation, pured diet.  Follow-up SLP and nutrition recommendations   Physical debilitation, muscle weakness-PT/OT ongoing   Goals of care - Daughter wants him to transition to home once his wounds are better.  Prior hospitalist discussed with daughter on 07/07/24 that his wounds might not improve.  Daughter does not want SNF or LTAC.  Palliative care following intermittently.  Patient is DNR.  hypothyroidism -Continue levothyroxine    Anxiety and adjustment disorder -Psychiatry consulted on 7/7 and recommended continuing Zoloft .  Psychiatry has signed off.   IDDM -continue Semglee , sliding scale, Accu-Cheks   Multiple pressure skin injuries: Present on admission.  Follow wound care consult recommendations. Pressure Injury 02/26/24 Coccyx Mid Stage 4 - Full thickness tissue loss with exposed bone, tendon or muscle. (Active)  02/26/24 0916  Location: Coccyx  Location Orientation: Mid  Staging: Stage 4 - Full thickness tissue loss with exposed bone, tendon or muscle.  Wound Description (Comments):   DO NOT USE:  Present on Admission: Yes     Pressure Injury 02/26/24 Toe (Comment  which  one) Anterior;Right Deep Tissue Pressure Injury - Purple or maroon localized area of discolored intact skin or blood-filled blister due to damage of underlying soft tissue from pressure and/or shear. right gre (Active)  02/26/24 1215  Location: Toe (Comment  which one)  Location Orientation: Anterior;Right  Staging: Deep Tissue Pressure Injury - Purple or maroon localized area of discolored intact skin or blood-filled blister due to damage of underlying soft tissue from pressure and/or shear.  Wound Description (Comments): right great toe  DO NOT USE:  Present on Admission:      Pressure Injury 03/06/24 Hip Anterior;Left;Proximal Unstageable - Full thickness tissue loss in which the base of the injury is covered by slough (yellow, tan, gray, green or brown) and/or eschar (tan, brown or black) in the wound bed. unstageable (Active)  03/06/24 1612  Location: Hip  Location Orientation: Anterior;Left;Proximal  Staging: Unstageable - Full thickness tissue loss in which the base of the injury is covered by slough (yellow, tan, gray, green or brown) and/or eschar (tan, brown or black) in the wound bed.  Wound Description (Comments): unstageable  DO NOT USE:  Present on Admission: No     Pressure Injury 03/06/24 Shoulder Left Unstageable - Full thickness tissue loss in which the base of the injury is covered by slough (yellow, tan, gray, green or brown) and/or eschar (tan, brown or black) in the wound bed. unstageable (Active)  03/06/24 1613  Location: Shoulder  Location Orientation: Left  Staging: Unstageable - Full thickness tissue loss in which the base of the injury is covered by slough (yellow, tan, gray, green or brown) and/or eschar (tan, brown or black) in the wound bed.  Wound Description (Comments): unstageable  DO NOT USE:  Present on Admission: No     Pressure Injury 04/23/24 Heel Right Unstageable - Full  thickness tissue loss in which the base of the injury is covered by slough (yellow,  tan, gray, green or brown) and/or eschar (tan, brown or black) in the wound bed. DTPI evolving into unstageable (Active)  04/23/24   Location: Heel  Location Orientation: Right  Staging: Unstageable - Full thickness tissue loss in which the base of the injury is covered by slough (yellow, tan, gray, green or brown) and/or eschar (tan, brown or black) in the wound bed.  Wound Description (Comments): DTPI evolving into unstageable  DO NOT USE:  Present on Admission: No     Pressure Injury 04/23/24 Heel Left Deep Tissue Pressure Injury - Purple or maroon localized area of discolored intact skin or blood-filled blister due to damage of underlying soft tissue from pressure and/or shear. (Active)  04/23/24   Location: Heel  Location Orientation: Left  Staging: Deep Tissue Pressure Injury - Purple or maroon localized area of discolored intact skin or blood-filled blister due to damage of underlying soft tissue from pressure and/or shear.  Wound Description (Comments):   DO NOT USE:  Present on Admission: No       DVT prophylaxis: Eliquis  Code Status: DNR Family Communication: None at bedside Disposition Plan: Status is: Inpatient Remains inpatient appropriate because: Of severity of illness  Consultants:  PCCM General Surgery Cardiology Infectious disease Plastic surgery Orthopedic surgery Palliative medicine  Procedures: As above  Antimicrobials:  Anti-infectives (From admission, onward)    Start     Dose/Rate Route Frequency Ordered Stop   07/08/24 1045  metroNIDAZOLE  (FLAGYL ) tablet 500 mg        500 mg Oral Every 12 hours 07/08/24 0951 07/12/24 2117   07/08/24 1045  cefTRIAXone  (ROCEPHIN ) 2 g in sodium chloride  0.9 % 100 mL IVPB        2 g 200 mL/hr over 30 Minutes Intravenous Every 24 hours 07/08/24 0951 07/12/24 1249   04/27/24 1615  cefTAZidime  (FORTAZ ) 2 g in sodium chloride  0.9 % 100 mL IVPB        2 g 200 mL/hr over 30 Minutes Intravenous Every 8 hours 04/27/24  1518 05/03/24 2359   03/16/24 1015  cefTRIAXone  (ROCEPHIN ) 2 g in sodium chloride  0.9 % 100 mL IVPB        2 g 200 mL/hr over 30 Minutes Intravenous Every 24 hours 03/16/24 0929 03/20/24 1219   03/16/24 1015  doxycycline  (VIBRA -TABS) tablet 100 mg        100 mg Oral Every 12 hours 03/16/24 0929 03/20/24 2131   03/14/24 1000  metroNIDAZOLE  (FLAGYL ) tablet 500 mg        500 mg Oral Every 12 hours 03/14/24 0803 03/19/24 2219   03/13/24 1000  cefadroxil  (DURICEF) capsule 1,000 mg  Status:  Discontinued        1,000 mg Oral 2 times daily 03/12/24 1632 03/16/24 0929   03/07/24 2200  metroNIDAZOLE  (FLAGYL ) tablet 500 mg        500 mg Oral Every 12 hours 03/07/24 0840 03/11/24 2258   03/05/24 1930  vancomycin  (VANCOREADY) IVPB 1250 mg/250 mL  Status:  Discontinued        1,250 mg 166.7 mL/hr over 90 Minutes Intravenous Every 24 hours 03/04/24 2205 03/04/24 2224   03/05/24 1930  Vancomycin  (VANCOCIN ) 1,250 mg in sodium chloride  0.9 % 250 mL IVPB  Status:  Discontinued        1,250 mg 166.7 mL/hr over 90 Minutes Intravenous Every 24 hours 03/04/24 2224 03/05/24 0717   03/05/24 1930  vancomycin  (VANCOREADY) IVPB 1500 mg/300 mL  Status:  Discontinued        1,500 mg 150 mL/hr over 120 Minutes Intravenous Every 24 hours 03/05/24 0717 03/12/24 1636   03/05/24 1400  ceFEPIme  (MAXIPIME ) 2 g in sodium chloride  0.9 % 100 mL IVPB  Status:  Discontinued        2 g 200 mL/hr over 30 Minutes Intravenous Every 8 hours 03/05/24 0717 03/12/24 1632   03/05/24 0600  ceFEPIme  (MAXIPIME ) 2 g in sodium chloride  0.9 % 100 mL IVPB  Status:  Discontinued        2 g 200 mL/hr over 30 Minutes Intravenous Every 12 hours 03/04/24 2205 03/05/24 0717   03/05/24 0000  metroNIDAZOLE  (FLAGYL ) IVPB 500 mg  Status:  Discontinued        500 mg 100 mL/hr over 60 Minutes Intravenous Every 12 hours 03/04/24 2206 03/04/24 2209   03/04/24 1730  aztreonam (AZACTAM) 2 g in sodium chloride  0.9 % 100 mL IVPB  Status:  Discontinued         2 g 200 mL/hr over 30 Minutes Intravenous  Once 03/04/24 1715 03/04/24 1724   03/04/24 1730  metroNIDAZOLE  (FLAGYL ) IVPB 500 mg  Status:  Discontinued        500 mg 100 mL/hr over 60 Minutes Intravenous Every 12 hours 03/04/24 1715 03/07/24 0840   03/04/24 1730  vancomycin  (VANCOREADY) IVPB 1500 mg/300 mL        1,500 mg 150 mL/hr over 120 Minutes Intravenous  Once 03/04/24 1715 03/04/24 2153   03/04/24 1730  ceFEPIme  (MAXIPIME ) 2 g in sodium chloride  0.9 % 100 mL IVPB        2 g 200 mL/hr over 30 Minutes Intravenous  Once 03/04/24 1724 03/04/24 1826         Subjective: Patient seen and examined at bedside.  Poor historian.  No agitation, fever, vomiting reported.  Objective: Vitals:   07/13/24 0014 07/13/24 0431 07/13/24 0500 07/13/24 0743  BP: 103/65 112/75  113/78  Pulse: 76 80  76  Resp: 20 20  18   Temp: 98.2 F (36.8 C) 98.7 F (37.1 C)  97.9 F (36.6 C)  TempSrc:  Oral    SpO2: 99% 100%  97%  Weight:   76 kg   Height:        Intake/Output Summary (Last 24 hours) at 07/13/2024 0802 Last data filed at 07/13/2024 0500 Gross per 24 hour  Intake 240 ml  Output 900 ml  Net -660 ml    Filed Weights   07/08/24 0600 07/09/24 0320 07/13/24 0500  Weight: 71.7 kg 75.3 kg 76 kg    Examination:  General: Remains on room air.  No distress.  Chronically ill and deconditioned looking. ENT/neck: No JVD elevation or palpable thyromegaly noted respiratory: Bilateral decreased breath sounds at bases with some crackles CVS: S1-S2 heard; currently rate controlled abdominal: Soft, nontender, remains slightly distended; no organomegaly, normal bowel sounds heard Extremities: Trace lower extremity edema present; no cyanosis  CNS: Remains slow to respond and an extremely poor historian.  No focal deficits noted  lymph: No lymphadenopathy palpable  skin: No obvious ecchymosis/rashes  psych: Not agitated.  Flat affect.   Data Reviewed: I have personally reviewed following labs and  imaging studies  CBC: Recent Labs  Lab 07/06/24 0847 07/08/24 0641 07/09/24 0433 07/10/24 0233  WBC 4.4 6.4 6.5 5.9  HGB 13.1 11.6* 11.6* 11.3*  HCT 40.2 34.1* 33.5* 33.1*  MCV 89.5 87.0 85.2 85.8  PLT 195 206 187 206   Basic Metabolic Panel: Recent Labs  Lab 07/06/24 0847 07/08/24 0641 07/09/24 0433 07/10/24 0233  NA 142 140 141 143  K 4.2 4.3 3.7 3.5  CL 108 107 105 106  CO2 23 23 26 24   GLUCOSE 106* 131* 91 157*  BUN 28* 27* 28* 32*  CREATININE 0.88 0.84 0.87 0.78  CALCIUM  8.7* 8.7* 8.6* 8.4*  MG 1.9 2.1 1.9 1.9  PHOS  --  2.1* 2.3* 2.2*   GFR: Estimated Creatinine Clearance: 74.5 mL/min (by C-G formula based on SCr of 0.78 mg/dL). Liver Function Tests: Recent Labs  Lab 07/06/24 0847 07/08/24 0641 07/09/24 0433 07/10/24 0233  AST 55* 46* 51* 41  ALT 60* 44 43 37  ALKPHOS 200* 162* 152* 143*  BILITOT 2.2* 2.5* 2.1* 2.0*  PROT 5.8* 5.8* 5.5* 5.5*  ALBUMIN  2.5* 3.1* 2.7* 3.1*   No results for input(s): LIPASE, AMYLASE in the last 168 hours. No results for input(s): AMMONIA in the last 168 hours. Coagulation Profile: No results for input(s): INR, PROTIME in the last 168 hours. Cardiac Enzymes: No results for input(s): CKTOTAL, CKMB, CKMBINDEX, TROPONINI in the last 168 hours. BNP (last 3 results) No results for input(s): PROBNP in the last 8760 hours. HbA1C: No results for input(s): HGBA1C in the last 72 hours. CBG: Recent Labs  Lab 07/12/24 0817 07/12/24 1207 07/12/24 1629 07/12/24 2104 07/13/24 0741  GLUCAP 119* 206* 171* 190* 83   Lipid Profile: No results for input(s): CHOL, HDL, LDLCALC, TRIG, CHOLHDL, LDLDIRECT in the last 72 hours. Thyroid  Function Tests: No results for input(s): TSH, T4TOTAL, FREET4, T3FREE, THYROIDAB in the last 72 hours. Anemia Panel: No results for input(s): VITAMINB12, FOLATE, FERRITIN, TIBC, IRON, RETICCTPCT in the last 72 hours. Sepsis Labs: No results for  input(s): PROCALCITON, LATICACIDVEN in the last 168 hours.  No results found for this or any previous visit (from the past 240 hours).       Radiology Studies: No results found.       Scheduled Meds:  (feeding supplement) PROSource Plus  30 mL Oral BID BM   amiodarone   200 mg Oral Daily   apixaban   5 mg Oral BID   calcium  carbonate  1 tablet Oral TID   collagenase    Topical Daily   diclofenac  Sodium  4 g Topical QID   feeding supplement  237 mL Oral BID BM   ferrous sulfate   325 mg Oral QHS   fluticasone   1 spray Each Nare Daily   furosemide   40 mg Oral Daily   guaiFENesin   600 mg Oral BID   insulin  aspart  0-9 Units Subcutaneous TID WC   insulin  glargine-yfgn  5 Units Subcutaneous Daily   levothyroxine   50 mcg Oral Q0600   lidocaine   1 patch Transdermal Q24H   lidocaine -EPINEPHrine  (PF)  30 mL Infiltration Once   liver oil-zinc  oxide   Topical BID   loratadine   10 mg Oral Daily   multivitamin with minerals  1 tablet Oral Daily   neomycin -bacitracin -polymyxin   Apply externally Daily   nutrition supplement (JUVEN)  1 packet Oral BID BM   mouth rinse  15 mL Mouth Rinse 4 times per day   pantoprazole   40 mg Oral Daily   polyethylene glycol  17 g Oral BID   senna-docusate  1 tablet Oral QHS   sertraline   50 mg Oral Daily   sodium chloride  flush  10-40 mL Intracatheter Q12H   Tafamidis   1 capsule Oral  Daily   vitamin A   10,000 Units Oral Daily   vutrisiran  sodium  25 mg Subcutaneous Q90 days   Continuous Infusions:  dextrose             Sophie Mao, MD [facility] 07/13/2024, 8:02 AM

## 2024-07-14 DIAGNOSIS — A419 Sepsis, unspecified organism: Secondary | ICD-10-CM | POA: Diagnosis not present

## 2024-07-14 DIAGNOSIS — R6520 Severe sepsis without septic shock: Secondary | ICD-10-CM | POA: Diagnosis not present

## 2024-07-14 LAB — GLUCOSE, CAPILLARY
Glucose-Capillary: 108 mg/dL — ABNORMAL HIGH (ref 70–99)
Glucose-Capillary: 133 mg/dL — ABNORMAL HIGH (ref 70–99)
Glucose-Capillary: 157 mg/dL — ABNORMAL HIGH (ref 70–99)
Glucose-Capillary: 132 mg/dL — ABNORMAL HIGH (ref 70–99)

## 2024-07-14 NOTE — Progress Notes (Signed)
 PROGRESS NOTE    Earl [name]  FMW:[id number] DOB: 07-02-1941 DOA: 03/04/2024 PCP: [name], MD   Brief Narrative:  83 year old M with PMH of PAF on Eliquis , HFmrEF, amyloid cardiomyopathy, NIDDM-2, CVA with right-sided weakness, stage IV sacral wound, pancytopenia, MGUS, HTN, HLD and hypothyroidism brought to ED by EMS from [facility] rehab due to pain in sacral area and shortness of breath 4 days after he was discharged from the hospital.  Patient was hospitalized from 3/26-4/10 with generalized weakness, encephalopathy and COVID-19 infection.  Per daughter, patient sustained skin injury during this hospitalization, and he had significant pain, discomfort and progressive foul-smelling discharge from sacral wound over the course of 4 to 5 days before coming back to ED. he was admitted due to persistent wounds.  General surgery, orthopedic surgery, ID all consulted.   See plan and assessment for more on hospital course.  Significant imaging / results / micro data: 4/14-COVID-19 PCR positive.  Flu and RSV PCR nonreactive. 4/14-blood cultures NGTD 6/4-sacral wound culture with Pseudomonas aeruginosa and Klebsiella pneumoniae 7/14-pleural fluid culture NGTD  Assessment & Plan:   Stage IV sacral decubitus wound/ulcer with severe sepsis on admission  - Culture data as above. -4/14-CT showed sacral wound extension to the right gluteus muscle without signs of osteomyelitis. -4/15-underwent bedside debridement of sacral decubitus wound by general surgery -4/21-ID consulted and recommended continuing broad-spectrum antibiotics that he completed on 6/13. 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 -6/17- ID recommended continuing holding off antibiotics since he is hemodynamically stable, afebrile without leukocytosis. -6/17-evaluated by plastic surgery.  Sacral wound felt to be clean.  Wound care  recommended -7/13-7/13-CT showed similar appearance of osteomyelitis underlying sacral decubitus ulcer.  -7/13-plastic surgery recommended for continued wound care and no surgical management. -7/14-Ortho evaluated patient for left hip wound.  Debulked and recommended optimizing nutrition -8/5- Surgery recommended hydrotherapy for left hip wound. Surgical debridement if not successful -8/5-wound care and instruction updated  -8/15-left hip wound worse with tunneling. Daughter concerned about this.  X-ray with left lateral hip soft tissue defect.  No acute bony abnormality.  -Overall, difficult case due to patient's nutritional status and limited mobility.  Prealbumin <5. - CT scan of the hip 8/15 with new left hip wound which extends deep, 2.8 cm without evidence of acute osteolysis involving the underlying left greater trochanter.  Ortho was reconsulted on 07/12/2024: Recommended conservative management.  Continue wound care as per wound care consult recommendations. - The pads on top of the heel wound are not sticking well due to dry and flaky skin.  Discussed with the bedside RN to put the foam pads and then wrap the foot and ankle to prevent them from sliding follow wound care consult recommendations.  Severe sepsis-present on admission.  Resolved.   Aspiration pneumonia-patient with worsening congestion, increased cough on 8/18.  Chest x-ray 8/18 with lower lobes collapsed and possible underlying pneumonia, likely due to being in bed for prolonged amount of time.  Completed 5 days of Rocephin  and Flagyl  on 07/12/2024. - Diet as per SLP's recommendations   Acute on chronic combined CHF, cardiac amyloidosis, nonischemic cardiomyopathy, hypotension - on tafamidis  and vutrisiran  for amyloid.  Goal-directed medical therapy somewhat limited by prior episodes of hypotension.   - Has received few doses of IV Lasix  recently along with IV albumin .  Currently on oral Lasix . -Continue Tafamidis  and  vutrisiran . -Ace wrap for BLE edema -Strict intake and output, daily weights, renal functions and electrolytes  Elevated LFTs  - Improving.  Lipitor still on hold.  Monitor intermittently.   Acute on chronic pain-continue current pain management with lidocaine  patch and as needed oxycodone    PAF-continue amiodarone  and Eliquis .  Currently rate controlled.  No bleeding   Anemia of chronic disease-from chronic illnesses.  Monitor intermittently.  No signs of bleeding.  Hemoglobin currently stable.  History of CVA-stable, on Eliquis , holding Lipitor due to LFTs   Concern for acute cholecystitis-ruled out, right upper quadrant ultrasound initially raise concern, however HIDA scan was negative.  This is likely due to anasarca/third spacing.     Dysphagia/severe protein calorie malnutrition-continue supplementation, pured diet.  Follow-up SLP and nutrition recommendations   Physical debilitation, muscle weakness-PT/OT ongoing   Goals of care - Daughter wants him to transition to home once his wounds are better.  Prior hospitalist discussed with daughter on 07/07/24 that his wounds might not improve.  Daughter does not want SNF or LTAC.  Palliative care following intermittently.  Patient is DNR.  hypothyroidism -Continue levothyroxine    Anxiety and adjustment disorder -Psychiatry consulted on 7/7 and recommended continuing Zoloft .  Psychiatry has signed off.   IDDM -continue Semglee , sliding scale, Accu-Cheks   Multiple pressure skin injuries: Present on admission.  Follow wound care consult recommendations. Pressure Injury 02/26/24 Coccyx Mid Stage 4 - Full thickness tissue loss with exposed bone, tendon or muscle. (Active)  02/26/24 0916  Location: Coccyx  Location Orientation: Mid  Staging: Stage 4 - Full thickness tissue loss with exposed bone, tendon or muscle.  Wound Description (Comments):   DO NOT USE:  Present on Admission: Yes     Pressure Injury 02/26/24 Toe (Comment  which  one) Anterior;Right Deep Tissue Pressure Injury - Purple or maroon localized area of discolored intact skin or blood-filled blister due to damage of underlying soft tissue from pressure and/or shear. right gre (Active)  02/26/24 1215  Location: Toe (Comment  which one)  Location Orientation: Anterior;Right  Staging: Deep Tissue Pressure Injury - Purple or maroon localized area of discolored intact skin or blood-filled blister due to damage of underlying soft tissue from pressure and/or shear.  Wound Description (Comments): right great toe  DO NOT USE:  Present on Admission:      Pressure Injury 03/06/24 Hip Anterior;Left;Proximal Unstageable - Full thickness tissue loss in which the base of the injury is covered by slough (yellow, tan, gray, green or brown) and/or eschar (tan, brown or black) in the wound bed. unstageable (Active)  03/06/24 1612  Location: Hip  Location Orientation: Anterior;Left;Proximal  Staging: Unstageable - Full thickness tissue loss in which the base of the injury is covered by slough (yellow, tan, gray, green or brown) and/or eschar (tan, brown or black) in the wound bed.  Wound Description (Comments): unstageable  DO NOT USE:  Present on Admission: No     Pressure Injury 03/06/24 Shoulder Left Unstageable - Full thickness tissue loss in which the base of the injury is covered by slough (yellow, tan, gray, green or brown) and/or eschar (tan, brown or black) in the wound bed. unstageable (Active)  03/06/24 1613  Location: Shoulder  Location Orientation: Left  Staging: Unstageable - Full thickness tissue loss in which the base of the injury is covered by slough (yellow, tan, gray, green or brown) and/or eschar (tan, brown or black) in the wound bed.  Wound Description (Comments): unstageable  DO NOT USE:  Present on Admission: No     Pressure Injury 04/23/24 Heel Right Unstageable - Full  thickness tissue loss in which the base of the injury is covered by slough (yellow,  tan, gray, green or brown) and/or eschar (tan, brown or black) in the wound bed. DTPI evolving into unstageable (Active)  04/23/24   Location: Heel  Location Orientation: Right  Staging: Unstageable - Full thickness tissue loss in which the base of the injury is covered by slough (yellow, tan, gray, green or brown) and/or eschar (tan, brown or black) in the wound bed.  Wound Description (Comments): DTPI evolving into unstageable  DO NOT USE:  Present on Admission: No     Pressure Injury 04/23/24 Heel Left Deep Tissue Pressure Injury - Purple or maroon localized area of discolored intact skin or blood-filled blister due to damage of underlying soft tissue from pressure and/or shear. (Active)  04/23/24   Location: Heel  Location Orientation: Left  Staging: Deep Tissue Pressure Injury - Purple or maroon localized area of discolored intact skin or blood-filled blister due to damage of underlying soft tissue from pressure and/or shear.  Wound Description (Comments):   DO NOT USE:  Present on Admission: No       DVT prophylaxis: Eliquis  Code Status: DNR Family Communication: None at bedside Disposition Plan: Status is: Inpatient Remains inpatient appropriate because: Of severity of illness  Consultants:  PCCM General Surgery Cardiology Infectious disease Plastic surgery Orthopedic surgery Palliative medicine  Procedures: As above  Antimicrobials:  Anti-infectives (From admission, onward)    Start     Dose/Rate Route Frequency Ordered Stop   07/08/24 1045  metroNIDAZOLE  (FLAGYL ) tablet 500 mg        500 mg Oral Every 12 hours 07/08/24 0951 07/12/24 2117   07/08/24 1045  cefTRIAXone  (ROCEPHIN ) 2 g in sodium chloride  0.9 % 100 mL IVPB        2 g 200 mL/hr over 30 Minutes Intravenous Every 24 hours 07/08/24 0951 07/12/24 1249   04/27/24 1615  cefTAZidime  (FORTAZ ) 2 g in sodium chloride  0.9 % 100 mL IVPB        2 g 200 mL/hr over 30 Minutes Intravenous Every 8 hours 04/27/24  1518 05/03/24 2359   03/16/24 1015  cefTRIAXone  (ROCEPHIN ) 2 g in sodium chloride  0.9 % 100 mL IVPB        2 g 200 mL/hr over 30 Minutes Intravenous Every 24 hours 03/16/24 0929 03/20/24 1219   03/16/24 1015  doxycycline  (VIBRA -TABS) tablet 100 mg        100 mg Oral Every 12 hours 03/16/24 0929 03/20/24 2131   03/14/24 1000  metroNIDAZOLE  (FLAGYL ) tablet 500 mg        500 mg Oral Every 12 hours 03/14/24 0803 03/19/24 2219   03/13/24 1000  cefadroxil  (DURICEF) capsule 1,000 mg  Status:  Discontinued        1,000 mg Oral 2 times daily 03/12/24 1632 03/16/24 0929   03/07/24 2200  metroNIDAZOLE  (FLAGYL ) tablet 500 mg        500 mg Oral Every 12 hours 03/07/24 0840 03/11/24 2258   03/05/24 1930  vancomycin  (VANCOREADY) IVPB 1250 mg/250 mL  Status:  Discontinued        1,250 mg 166.7 mL/hr over 90 Minutes Intravenous Every 24 hours 03/04/24 2205 03/04/24 2224   03/05/24 1930  Vancomycin  (VANCOCIN ) 1,250 mg in sodium chloride  0.9 % 250 mL IVPB  Status:  Discontinued        1,250 mg 166.7 mL/hr over 90 Minutes Intravenous Every 24 hours 03/04/24 2224 03/05/24 0717   03/05/24 1930  vancomycin  (VANCOREADY) IVPB 1500 mg/300 mL  Status:  Discontinued        1,500 mg 150 mL/hr over 120 Minutes Intravenous Every 24 hours 03/05/24 0717 03/12/24 1636   03/05/24 1400  ceFEPIme  (MAXIPIME ) 2 g in sodium chloride  0.9 % 100 mL IVPB  Status:  Discontinued        2 g 200 mL/hr over 30 Minutes Intravenous Every 8 hours 03/05/24 0717 03/12/24 1632   03/05/24 0600  ceFEPIme  (MAXIPIME ) 2 g in sodium chloride  0.9 % 100 mL IVPB  Status:  Discontinued        2 g 200 mL/hr over 30 Minutes Intravenous Every 12 hours 03/04/24 2205 03/05/24 0717   03/05/24 0000  metroNIDAZOLE  (FLAGYL ) IVPB 500 mg  Status:  Discontinued        500 mg 100 mL/hr over 60 Minutes Intravenous Every 12 hours 03/04/24 2206 03/04/24 2209   03/04/24 1730  aztreonam (AZACTAM) 2 g in sodium chloride  0.9 % 100 mL IVPB  Status:  Discontinued         2 g 200 mL/hr over 30 Minutes Intravenous  Once 03/04/24 1715 03/04/24 1724   03/04/24 1730  metroNIDAZOLE  (FLAGYL ) IVPB 500 mg  Status:  Discontinued        500 mg 100 mL/hr over 60 Minutes Intravenous Every 12 hours 03/04/24 1715 03/07/24 0840   03/04/24 1730  vancomycin  (VANCOREADY) IVPB 1500 mg/300 mL        1,500 mg 150 mL/hr over 120 Minutes Intravenous  Once 03/04/24 1715 03/04/24 2153   03/04/24 1730  ceFEPIme  (MAXIPIME ) 2 g in sodium chloride  0.9 % 100 mL IVPB        2 g 200 mL/hr over 30 Minutes Intravenous  Once 03/04/24 1724 03/04/24 1826         Subjective: Patient seen and examined at bedside.  Poor historian.  No vomiting, fever, agitation reported.   Objective: Vitals:   07/13/24 1954 07/13/24 2356 07/14/24 0408 07/14/24 0748  BP: 131/82 (!) 104/56 105/65 104/64  Pulse: 76 70 69 71  Resp: 18 18 17 18   Temp: 98.1 F (36.7 C) 98.1 F (36.7 C) 98 F (36.7 C) 97.7 F (36.5 C)  TempSrc: Oral Oral Oral   SpO2: 97% 98% 98% 99%  Weight:      Height:        Intake/Output Summary (Last 24 hours) at 07/14/2024 0812 Last data filed at 07/13/2024 0913 Gross per 24 hour  Intake 594 ml  Output --  Net 594 ml    Filed Weights   07/08/24 0600 07/09/24 0320 07/13/24 0500  Weight: 71.7 kg 75.3 kg 76 kg    Examination:  General: No acute distress.  Currently on room air.  Chronically ill and deconditioned looking. ENT/neck: No neck masses or elevated JVD noted respiratory: Decreased breath sounds at bases bilaterally with scattered crackles  CVS: Rate currently controlled; S1 and S2 are heard abdominal: Soft, nontender, still distended; no organomegaly, sounds are normally heard  extremities: No clubbing; mild lower extremity edema present  CNS: Still very slow to respond and poor historian.  No obvious focal deficits noted  lymph: No lymphadenopathy noted skin: No obvious lesions/petechiae psych: Extremely flat affect.  Currently not agitated.  Data Reviewed:  I have personally reviewed following labs and imaging studies  CBC: Recent Labs  Lab 07/08/24 0641 07/09/24 0433 07/10/24 0233  WBC 6.4 6.5 5.9  HGB 11.6* 11.6* 11.3*  HCT 34.1* 33.5* 33.1*  MCV 87.0 85.2  85.8  PLT 206 187 206   Basic Metabolic Panel: Recent Labs  Lab 07/08/24 0641 07/09/24 0433 07/10/24 0233  NA 140 141 143  K 4.3 3.7 3.5  CL 107 105 106  CO2 23 26 24   GLUCOSE 131* 91 157*  BUN 27* 28* 32*  CREATININE 0.84 0.87 0.78  CALCIUM  8.7* 8.6* 8.4*  MG 2.1 1.9 1.9  PHOS 2.1* 2.3* 2.2*   GFR: Estimated Creatinine Clearance: 74.5 mL/min (by C-G formula based on SCr of 0.78 mg/dL). Liver Function Tests: Recent Labs  Lab 07/08/24 0641 07/09/24 0433 07/10/24 0233  AST 46* 51* 41  ALT 44 43 37  ALKPHOS 162* 152* 143*  BILITOT 2.5* 2.1* 2.0*  PROT 5.8* 5.5* 5.5*  ALBUMIN  3.1* 2.7* 3.1*   No results for input(s): LIPASE, AMYLASE in the last 168 hours. No results for input(s): AMMONIA in the last 168 hours. Coagulation Profile: No results for input(s): INR, PROTIME in the last 168 hours. Cardiac Enzymes: No results for input(s): CKTOTAL, CKMB, CKMBINDEX, TROPONINI in the last 168 hours. BNP (last 3 results) No results for input(s): PROBNP in the last 8760 hours. HbA1C: No results for input(s): HGBA1C in the last 72 hours. CBG: Recent Labs  Lab 07/13/24 0741 07/13/24 1127 07/13/24 1636 07/13/24 2053 07/14/24 0746  GLUCAP 83 145* 236* 156* 108*   Lipid Profile: No results for input(s): CHOL, HDL, LDLCALC, TRIG, CHOLHDL, LDLDIRECT in the last 72 hours. Thyroid  Function Tests: No results for input(s): TSH, T4TOTAL, FREET4, T3FREE, THYROIDAB in the last 72 hours. Anemia Panel: No results for input(s): VITAMINB12, FOLATE, FERRITIN, TIBC, IRON, RETICCTPCT in the last 72 hours. Sepsis Labs: No results for input(s): PROCALCITON, LATICACIDVEN in the last 168 hours.  No results found for this  or any previous visit (from the past 240 hours).       Radiology Studies: No results found.       Scheduled Meds:  (feeding supplement) PROSource Plus  30 mL Oral BID BM   amiodarone   200 mg Oral Daily   apixaban   5 mg Oral BID   calcium  carbonate  1 tablet Oral TID   collagenase    Topical Daily   diclofenac  Sodium  4 g Topical QID   feeding supplement  237 mL Oral BID BM   ferrous sulfate   325 mg Oral QHS   fluticasone   1 spray Each Nare Daily   furosemide   40 mg Oral Daily   guaiFENesin   600 mg Oral BID   insulin  aspart  0-9 Units Subcutaneous TID WC   insulin  glargine-yfgn  5 Units Subcutaneous Daily   levothyroxine   50 mcg Oral Q0600   lidocaine   1 patch Transdermal Q24H   lidocaine -EPINEPHrine  (PF)  30 mL Infiltration Once   liver oil-zinc  oxide   Topical BID   loratadine   10 mg Oral Daily   multivitamin with minerals  1 tablet Oral Daily   neomycin -bacitracin -polymyxin   Apply externally Daily   nutrition supplement (JUVEN)  1 packet Oral BID BM   mouth rinse  15 mL Mouth Rinse 4 times per day   pantoprazole   40 mg Oral Daily   polyethylene glycol  17 g Oral BID   senna-docusate  1 tablet Oral QHS   sertraline   50 mg Oral Daily   sodium chloride  flush  10-40 mL Intracatheter Q12H   Tafamidis   1 capsule Oral Daily   vitamin A   10,000 Units Oral Daily   vutrisiran  sodium  25 mg Subcutaneous Q90 days  Continuous Infusions:  dextrose             Sophie Mao, MD [facility] 07/14/2024, 8:12 AM

## 2024-07-14 NOTE — Plan of Care (Signed)
  Problem: Fluid Volume: Goal: Hemodynamic stability will improve Outcome: Progressing   Problem: Clinical Measurements: Goal: Diagnostic test results will improve Outcome: Progressing Goal: Signs and symptoms of infection will decrease Outcome: Progressing   Problem: Education: Goal: Knowledge of risk factors and measures for prevention of condition will improve Outcome: Progressing   Problem: Coping: Goal: Psychosocial and spiritual needs will be supported Outcome: Progressing   Problem: Respiratory: Goal: Will maintain a patent airway Outcome: Progressing Goal: Complications related to the disease process, condition or treatment will be avoided or minimized Outcome: Progressing   Problem: Education: Goal: Ability to describe self-care measures that may prevent or decrease complications (Diabetes Survival Skills Education) will improve Outcome: Progressing   Problem: Coping: Goal: Ability to adjust to condition or change in health will improve Outcome: Progressing   Problem: Fluid Volume: Goal: Ability to maintain a balanced intake and output will improve Outcome: Progressing   Problem: Health Behavior/Discharge Planning: Goal: Ability to manage health-related needs will improve Outcome: Progressing   Problem: Metabolic: Goal: Ability to maintain appropriate glucose levels will improve Outcome: Progressing   Problem: Nutritional: Goal: Maintenance of adequate nutrition will improve Outcome: Progressing Goal: Progress toward achieving an optimal weight will improve Outcome: Progressing   Problem: Skin Integrity: Goal: Risk for impaired skin integrity will decrease Outcome: Progressing   Problem: Tissue Perfusion: Goal: Adequacy of tissue perfusion will improve Outcome: Progressing   Problem: Education: Goal: Knowledge of General Education information will improve Description: Including pain rating scale, medication(s)/side effects and non-pharmacologic  comfort measures Outcome: Progressing   Problem: Health Behavior/Discharge Planning: Goal: Ability to manage health-related needs will improve Outcome: Progressing   Problem: Clinical Measurements: Goal: Ability to maintain clinical measurements within normal limits will improve Outcome: Progressing Goal: Will remain free from infection Outcome: Progressing Goal: Diagnostic test results will improve Outcome: Progressing Goal: Respiratory complications will improve Outcome: Progressing Goal: Cardiovascular complication will be avoided Outcome: Progressing   Problem: Activity: Goal: Risk for activity intolerance will decrease Outcome: Progressing   Problem: Nutrition: Goal: Adequate nutrition will be maintained Outcome: Progressing   Problem: Coping: Goal: Level of anxiety will decrease Outcome: Progressing   Problem: Elimination: Goal: Will not experience complications related to bowel motility Outcome: Progressing Goal: Will not experience complications related to urinary retention Outcome: Progressing   Problem: Pain Managment: Goal: General experience of comfort will improve and/or be controlled Outcome: Progressing   Problem: Safety: Goal: Ability to remain free from injury will improve Outcome: Progressing   Problem: Skin Integrity: Goal: Risk for impaired skin integrity will decrease Outcome: Progressing

## 2024-07-15 DIAGNOSIS — R6520 Severe sepsis without septic shock: Secondary | ICD-10-CM | POA: Diagnosis not present

## 2024-07-15 DIAGNOSIS — A419 Sepsis, unspecified organism: Secondary | ICD-10-CM | POA: Diagnosis not present

## 2024-07-15 LAB — GLUCOSE, CAPILLARY
Glucose-Capillary: 86 mg/dL (ref 70–99)
Glucose-Capillary: 134 mg/dL — ABNORMAL HIGH (ref 70–99)
Glucose-Capillary: 181 mg/dL — ABNORMAL HIGH (ref 70–99)
Glucose-Capillary: 114 mg/dL — ABNORMAL HIGH (ref 70–99)

## 2024-07-15 NOTE — Progress Notes (Signed)
 Nutrition Follow-up  DOCUMENTATION CODES:   Severe malnutrition in context of chronic illness  INTERVENTION:  Continue dysphagia 1 diet as ordered Continue Juven BID to support ongoing wound healing needs Continue mixing 30 ml ProSource Plus BID with Juven, each supplement provides 100 kcals and 15 grams protein.  Continue Ensure Plus High Protein PO BID, each supplement provides 350 kcal and 20 grams of protein. Check weights weekly Pt with continued deficiency of Vitamin A , though improving per most recent check (8/18)   NUTRITION DIAGNOSIS:   Severe Malnutrition related to chronic illness as evidenced by energy intake < or equal to 75% for > or equal to 1 month, severe muscle depletion.  Ongoing  GOAL:   Patient will meet greater than or equal to 90% of their needs  Met  MONITOR:   PO intake, Supplement acceptance, Weight trends, Skin  REASON FOR ASSESSMENT:   Consult Assessment of nutrition requirement/status  ASSESSMENT:   83 year old male who presented to the ED on 03/04/24 from [facility] due to sacral pain. PMH of T2DM, HTN, HLD, CHF, atrial fibrillation, stroke with right-sided weakness, GERD, BPH, hypothyroidism, MGUS, transthyretin associated amyloid cardiomyopathy, chronic pancytopenia, recent hospital admission for COVID-19. Pt admitted with sepsis secondary to sacral pressure injury infection.  Spoke to pt at bedside. No noted n/v/c/d, chewing/swallowing appear stable. Documented meal intake of 88% x 72 hrs, improved. Continues on supplements (see above). Documented weight has increased desirably, goal of BMI >/=23. Pt continues to be difficult d/c as family is not interested in SNF, LTACH though will not care for him at home until wounds are improved. Repeat Vit A trending upwards, CRP 2.4. No changes to nutritional POC at this time, will continue to monitor, RDN available prn.   Labs BG 86-157 BUN 32 Ca 8.4 Phos 2.2 ALK 143 Albumin  3.1 CRP 2.4 Vit A  8.6 H/H 11.3/33.1  Medications  (feeding supplement) PROSource Plus  30 mL Oral BID BM   amiodarone   200 mg Oral Daily   apixaban   5 mg Oral BID   calcium  carbonate  1 tablet Oral TID   collagenase    Topical Daily   diclofenac  Sodium  4 g Topical QID   feeding supplement  237 mL Oral BID BM   ferrous sulfate   325 mg Oral QHS   fluticasone   1 spray Each Nare Daily   furosemide   40 mg Oral Daily   guaiFENesin   600 mg Oral BID   insulin  aspart  0-9 Units Subcutaneous TID WC   insulin  glargine-yfgn  5 Units Subcutaneous Daily   levothyroxine   50 mcg Oral Q0600   lidocaine   1 patch Transdermal Q24H   lidocaine -EPINEPHrine  (PF)  30 mL Infiltration Once   liver oil-zinc  oxide   Topical BID   loratadine   10 mg Oral Daily   multivitamin with minerals  1 tablet Oral Daily   neomycin -bacitracin -polymyxin   Apply externally Daily   nutrition supplement (JUVEN)  1 packet Oral BID BM   mouth rinse  15 mL Mouth Rinse 4 times per day   pantoprazole   40 mg Oral Daily   polyethylene glycol  17 g Oral BID   senna-docusate  1 tablet Oral QHS   sertraline   50 mg Oral Daily   sodium chloride  flush  10-40 mL Intracatheter Q12H   Tafamidis   1 capsule Oral Daily   vitamin A   10,000 Units Oral Daily   vutrisiran  sodium  25 mg Subcutaneous Q90 days     NUTRITION - FOCUSED  PHYSICAL EXAM:  Flowsheet Row Most Recent Value  Orbital Region Moderate depletion  Upper Arm Region Moderate depletion  Thoracic and Lumbar Region Mild depletion  Buccal Region Mild depletion  Temple Region Moderate depletion  Clavicle Bone Region Severe depletion  Clavicle and Acromion Bone Region Severe depletion  Scapular Bone Region Severe depletion  Dorsal Hand Mild depletion  Patellar Region Moderate depletion  Anterior Thigh Region Mild depletion  Posterior Calf Region Moderate depletion  Edema (RD Assessment) Mild  Hair Reviewed  Eyes Reviewed  Mouth Reviewed  Skin Reviewed  Nails Reviewed    Diet Order:    Diet Order             DIET - DYS 1 Room service appropriate? Yes; Fluid consistency: Nectar Thick  Diet effective now                   EDUCATION NEEDS:   No education needs have been identified at this time  Skin:  Skin Assessment: Skin Integrity Issues: Skin Integrity Issues:: Stage IV, Unstageable, DTI DTI: Heel Stage II: - Stage IV: Sacrum Unstageable: L hip, L shoulder, R heel, L medial foot Other: -  Last BM:  8/24  Height:   Ht Readings from Last 1 Encounters:  03/04/24 5' 11 (1.803 m)    Weight:   Wt Readings from Last 1 Encounters:  07/15/24 68.9 kg    Ideal Body Weight:  78.2 kg  BMI:  Body mass index is 21.2 kg/m.  Estimated Nutritional Needs:   Kcal:  1900-2100  Protein:  100-115g  Fluid:  >/=1.9L  Soriyah Osberg Daml-Budig, RDN, LDN Registered Dietitian Nutritionist RD Inpatient Contact Info in [place]

## 2024-07-15 NOTE — Progress Notes (Signed)
 SLP Note  Patient Details Name: Earl [name] MRN: [id number] DOB: [birth year]   Cancelled treatment:        Received message that daughter Para had called rehab department with questions requesting call back. Called number provided, but she was unavailable at time of attempt.  Left voicemail.   Anette FORBES Grippe, MA, CCC-SLP Acute Rehabilitation Services Office: [phone] 07/15/2024, 11:55 AM

## 2024-07-15 NOTE — Progress Notes (Addendum)
 Speech Language Pathology Treatment: Dysphagia  Patient Details Name: Earl [name] MRN: [id number] DOB: 25-Nov-1940 Today's Date: 07/15/2024 Time: 9054-9044 SLP Time Calculation (min) (ACUTE ONLY): 10 min  Assessment / Plan / Recommendation Clinical Impression  Pt seen for ongoing dysphagia management.  Pt drinking nectar thick liquid on SLP arrival with cough noted.  Pt with ice in drink.  Provided education to pt and RN as ice in thickened beverages will thin liquids increasing risk of aspiration.  Pt trialed with sips of water today to determine if pt as appropriate for diet advancement and/or repeat instrumental.  Consistent wet cough of 3 of 3 trials of thin liquid.  Pt has now completed abx for pneumonia as of 8/23, but still sounds very congested.  Consider repeat CXR if indicated.  Pt does not appear ready to advance liquid consistency at this time.  Recommend continuing nectar thick liquids without ice, or with nectar thick ice made at home. If possible, thicken pre chilled drinks for pts.  Pt may have unthickened ice chips in moderation, after good oral care, in between meals only (~30 minutes after eating) when fully awake/alert, with upright positioning and direct supervision.  Today pt expresses a desire to advance diet consistency from puree.  He tolerated mechanical soft solid without any clinical s/s of aspiration and achieved adequate oral clearance with only a thin film of diffuse oral residue.  There was no pocketing observed. SLP will continue to follow for readiness for repeat instrumental.  Recommend mechanical soft solids with nectar thick liquids.  If pt has difficulty with advanced textures, could downgrade to puree or ground/chopped diet for ease of intake.   HPI HPI: Earl [name] is an 83 y.o. 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 He was discharged from the hospital (admitted  for acute metabolic encephalopathy, hypotensive, hypoglycemic) to [facility] for rehab. He presented back to the hospital four days later on 03/04/24 with SOB and pain in sacral area. 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 SLP previously saw pt to assess and manage concerns for dysphagia. He was D/Cd on a pureed diet with thin liquids. SLP re-consulted on 05/02/24 for voice assessment; signed off on 8/4 with recs for OP ENT if warranted. New swallowing consult ordered 8/18 due to new onset coughing after eating. MBS 8/19 - intermittent trace aspiration thin liquids; rec thicken to nectar to allow time to recover from acute pna.      SLP Plan  Continue with current plan of care          Recommendations  Diet recommendations: Dysphagia 3 (mechanical soft);Nectar-thick liquid Liquids provided via: Cup;Straw Medication Administration: Whole meds with puree Supervision: Staff to assist with self feeding Compensations: Small sips/bites;Slow rate Postural Changes and/or Swallow Maneuvers: Seated upright 90 degrees                  Oral care BID   Frequent or constant Supervision/Assistance Dysphagia, oropharyngeal phase (R13.12)     Continue with current plan of care     Anette FORBES Grippe, MA, CCC-SLP Acute Rehabilitation Services Office: [phone] 07/15/2024, 10:24 AM

## 2024-07-15 NOTE — Plan of Care (Signed)
  Problem: Fluid Volume: Goal: Hemodynamic stability will improve Outcome: Progressing   Problem: Clinical Measurements: Goal: Diagnostic test results will improve Outcome: Progressing Goal: Signs and symptoms of infection will decrease Outcome: Progressing   Problem: Education: Goal: Knowledge of risk factors and measures for prevention of condition will improve Outcome: Progressing   Problem: Coping: Goal: Psychosocial and spiritual needs will be supported Outcome: Progressing   Problem: Respiratory: Goal: Will maintain a patent airway Outcome: Progressing Goal: Complications related to the disease process, condition or treatment will be avoided or minimized Outcome: Progressing   Problem: Education: Goal: Ability to describe self-care measures that may prevent or decrease complications (Diabetes Survival Skills Education) will improve Outcome: Progressing   Problem: Coping: Goal: Ability to adjust to condition or change in health will improve Outcome: Progressing   Problem: Fluid Volume: Goal: Ability to maintain a balanced intake and output will improve Outcome: Progressing   Problem: Health Behavior/Discharge Planning: Goal: Ability to manage health-related needs will improve Outcome: Progressing   Problem: Metabolic: Goal: Ability to maintain appropriate glucose levels will improve Outcome: Progressing   Problem: Nutritional: Goal: Maintenance of adequate nutrition will improve Outcome: Progressing Goal: Progress toward achieving an optimal weight will improve Outcome: Progressing   Problem: Skin Integrity: Goal: Risk for impaired skin integrity will decrease Outcome: Progressing   Problem: Tissue Perfusion: Goal: Adequacy of tissue perfusion will improve Outcome: Progressing   Problem: Education: Goal: Knowledge of General Education information will improve Description: Including pain rating scale, medication(s)/side effects and non-pharmacologic  comfort measures Outcome: Progressing   Problem: Health Behavior/Discharge Planning: Goal: Ability to manage health-related needs will improve Outcome: Progressing   Problem: Clinical Measurements: Goal: Ability to maintain clinical measurements within normal limits will improve Outcome: Progressing Goal: Will remain free from infection Outcome: Progressing Goal: Diagnostic test results will improve Outcome: Progressing Goal: Respiratory complications will improve Outcome: Progressing Goal: Cardiovascular complication will be avoided Outcome: Progressing   Problem: Activity: Goal: Risk for activity intolerance will decrease Outcome: Progressing   Problem: Nutrition: Goal: Adequate nutrition will be maintained Outcome: Progressing   Problem: Coping: Goal: Level of anxiety will decrease Outcome: Progressing   Problem: Elimination: Goal: Will not experience complications related to bowel motility Outcome: Progressing Goal: Will not experience complications related to urinary retention Outcome: Progressing   Problem: Pain Managment: Goal: General experience of comfort will improve and/or be controlled Outcome: Progressing   Problem: Safety: Goal: Ability to remain free from injury will improve Outcome: Progressing   Problem: Skin Integrity: Goal: Risk for impaired skin integrity will decrease Outcome: Progressing

## 2024-07-15 NOTE — Progress Notes (Signed)
 PROGRESS NOTE    Earl [name]  FMW:[id number] DOB: 02-26-1941 DOA: 03/04/2024 PCP: [name], MD   Brief Narrative:  83 year old M with PMH of PAF on Eliquis , HFmrEF, amyloid cardiomyopathy, NIDDM-2, CVA with right-sided weakness, stage IV sacral wound, pancytopenia, MGUS, HTN, HLD and hypothyroidism brought to ED by EMS from [facility] rehab due to pain in sacral area and shortness of breath 4 days after he was discharged from the hospital.  Patient was hospitalized from 3/26-4/10 with generalized weakness, encephalopathy and COVID-19 infection.  Per daughter, patient sustained skin injury during this hospitalization, and he had significant pain, discomfort and progressive foul-smelling discharge from sacral wound over the course of 4 to 5 days before coming back to ED. he was admitted due to persistent wounds.  General surgery, orthopedic surgery, ID all consulted.   See plan and assessment for more on hospital course.  Significant imaging / results / micro data: 4/14-COVID-19 PCR positive.  Flu and RSV PCR nonreactive. 4/14-blood cultures NGTD 6/4-sacral wound culture with Pseudomonas aeruginosa and Klebsiella pneumoniae 7/14-pleural fluid culture NGTD  Assessment & Plan:   Stage IV sacral decubitus wound/ulcer with severe sepsis on admission  - Culture data as above. -4/14-CT showed sacral wound extension to the right gluteus muscle without signs of osteomyelitis. -4/15-underwent bedside debridement of sacral decubitus wound by general surgery -4/21-ID consulted and recommended continuing broad-spectrum antibiotics that he completed on 6/13. 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 -6/17- ID recommended continuing holding off antibiotics since he is hemodynamically stable, afebrile without leukocytosis. -6/17-evaluated by plastic surgery.  Sacral wound felt to be clean.  Wound care  recommended -7/13-7/13-CT showed similar appearance of osteomyelitis underlying sacral decubitus ulcer.  -7/13-plastic surgery recommended for continued wound care and no surgical management. -7/14-Ortho evaluated patient for left hip wound.  Debulked and recommended optimizing nutrition -8/5- Surgery recommended hydrotherapy for left hip wound. Surgical debridement if not successful -8/5-wound care and instruction updated  -8/15-left hip wound worse with tunneling. Daughter concerned about this.  X-ray with left lateral hip soft tissue defect.  No acute bony abnormality.  -Overall, difficult case due to patient's nutritional status and limited mobility.  Prealbumin <5. - CT scan of the hip 8/15 with new left hip wound which extends deep, 2.8 cm without evidence of acute osteolysis involving the underlying left greater trochanter.  Ortho was reconsulted on 07/12/2024: Recommended conservative management.  Continue wound care as per wound care consult recommendations. - The pads on top of the heel wound are not sticking well due to dry and flaky skin.  Discussed with the bedside RN to put the foam pads and then wrap the foot and ankle to prevent them from sliding follow wound care consult recommendations.  Severe sepsis-present on admission.  Resolved.   Aspiration pneumonia-patient with worsening congestion, increased cough on 8/18.  Chest x-ray 8/18 with lower lobes collapsed and possible underlying pneumonia, likely due to being in bed for prolonged amount of time.  Completed 5 days of Rocephin  and Flagyl  on 07/12/2024. - Diet as per SLP's recommendations   Acute on chronic combined CHF, cardiac amyloidosis, nonischemic cardiomyopathy, hypotension - on tafamidis  and vutrisiran  for amyloid.  Goal-directed medical therapy somewhat limited by prior episodes of hypotension.   - Has received few doses of IV Lasix  recently along with IV albumin .  Currently on oral Lasix . -Continue Tafamidis  and  vutrisiran . -Ace wrap for BLE edema -Strict intake and output, daily weights, renal functions and electrolytes  Elevated LFTs  - Improving.  Lipitor still on hold.  Monitor intermittently.   Acute on chronic pain-continue current pain management with lidocaine  patch and as needed oxycodone    PAF-continue amiodarone  and Eliquis .  Currently rate controlled.  No bleeding   Anemia of chronic disease-from chronic illnesses.  Monitor intermittently.  No signs of bleeding.  Hemoglobin currently stable.  History of CVA-stable, on Eliquis , holding Lipitor due to LFTs   Concern for acute cholecystitis-ruled out, right upper quadrant ultrasound initially raise concern, however HIDA scan was negative.  This is likely due to anasarca/third spacing.     Dysphagia/severe protein calorie malnutrition-continue supplementation, pured diet.  Follow-up SLP and nutrition recommendations   Physical debilitation, muscle weakness-PT/OT ongoing   Goals of care - Daughter wants him to transition to home once his wounds are better.  Prior hospitalist discussed with daughter on 07/07/24 that his wounds might not improve.  Daughter does not want SNF or LTAC.  Palliative care following intermittently.  Patient is DNR.  hypothyroidism -Continue levothyroxine    Anxiety and adjustment disorder -Psychiatry consulted on 7/7 and recommended continuing Zoloft .  Psychiatry has signed off.   IDDM -continue Semglee , sliding scale, Accu-Cheks   Multiple pressure skin injuries: Present on admission.  Follow wound care consult recommendations. Pressure Injury 02/26/24 Coccyx Mid Stage 4 - Full thickness tissue loss with exposed bone, tendon or muscle. (Active)  02/26/24 0916  Location: Coccyx  Location Orientation: Mid  Staging: Stage 4 - Full thickness tissue loss with exposed bone, tendon or muscle.  Wound Description (Comments):   DO NOT USE:  Present on Admission: Yes     Pressure Injury 02/26/24 Toe (Comment  which  one) Anterior;Right Deep Tissue Pressure Injury - Purple or maroon localized area of discolored intact skin or blood-filled blister due to damage of underlying soft tissue from pressure and/or shear. right gre (Active)  02/26/24 1215  Location: Toe (Comment  which one)  Location Orientation: Anterior;Right  Staging: Deep Tissue Pressure Injury - Purple or maroon localized area of discolored intact skin or blood-filled blister due to damage of underlying soft tissue from pressure and/or shear.  Wound Description (Comments): right great toe  DO NOT USE:  Present on Admission:      Pressure Injury 03/06/24 Hip Anterior;Left;Proximal Unstageable - Full thickness tissue loss in which the base of the injury is covered by slough (yellow, tan, gray, green or brown) and/or eschar (tan, brown or black) in the wound bed. unstageable (Active)  03/06/24 1612  Location: Hip  Location Orientation: Anterior;Left;Proximal  Staging: Unstageable - Full thickness tissue loss in which the base of the injury is covered by slough (yellow, tan, gray, green or brown) and/or eschar (tan, brown or black) in the wound bed.  Wound Description (Comments): unstageable  DO NOT USE:  Present on Admission: No     Pressure Injury 03/06/24 Shoulder Left Unstageable - Full thickness tissue loss in which the base of the injury is covered by slough (yellow, tan, gray, green or brown) and/or eschar (tan, brown or black) in the wound bed. unstageable (Active)  03/06/24 1613  Location: Shoulder  Location Orientation: Left  Staging: Unstageable - Full thickness tissue loss in which the base of the injury is covered by slough (yellow, tan, gray, green or brown) and/or eschar (tan, brown or black) in the wound bed.  Wound Description (Comments): unstageable  DO NOT USE:  Present on Admission: No     Pressure Injury 04/23/24 Heel Right Unstageable - Full  thickness tissue loss in which the base of the injury is covered by slough (yellow,  tan, gray, green or brown) and/or eschar (tan, brown or black) in the wound bed. DTPI evolving into unstageable (Active)  04/23/24   Location: Heel  Location Orientation: Right  Staging: Unstageable - Full thickness tissue loss in which the base of the injury is covered by slough (yellow, tan, gray, green or brown) and/or eschar (tan, brown or black) in the wound bed.  Wound Description (Comments): DTPI evolving into unstageable  DO NOT USE:  Present on Admission: No     Pressure Injury 04/23/24 Heel Left Deep Tissue Pressure Injury - Purple or maroon localized area of discolored intact skin or blood-filled blister due to damage of underlying soft tissue from pressure and/or shear. (Active)  04/23/24   Location: Heel  Location Orientation: Left  Staging: Deep Tissue Pressure Injury - Purple or maroon localized area of discolored intact skin or blood-filled blister due to damage of underlying soft tissue from pressure and/or shear.  Wound Description (Comments):   DO NOT USE:  Present on Admission: No       DVT prophylaxis: Eliquis  Code Status: DNR Family Communication: None at bedside Disposition Plan: Status is: Inpatient Remains inpatient appropriate because: Of severity of illness  Consultants:  PCCM General Surgery Cardiology Infectious disease Plastic surgery Orthopedic surgery Palliative medicine  Procedures: As above  Antimicrobials:  Anti-infectives (From admission, onward)    Start     Dose/Rate Route Frequency Ordered Stop   07/08/24 1045  metroNIDAZOLE  (FLAGYL ) tablet 500 mg        500 mg Oral Every 12 hours 07/08/24 0951 07/12/24 2117   07/08/24 1045  cefTRIAXone  (ROCEPHIN ) 2 g in sodium chloride  0.9 % 100 mL IVPB        2 g 200 mL/hr over 30 Minutes Intravenous Every 24 hours 07/08/24 0951 07/12/24 1249   04/27/24 1615  cefTAZidime  (FORTAZ ) 2 g in sodium chloride  0.9 % 100 mL IVPB        2 g 200 mL/hr over 30 Minutes Intravenous Every 8 hours 04/27/24  1518 05/03/24 2359   03/16/24 1015  cefTRIAXone  (ROCEPHIN ) 2 g in sodium chloride  0.9 % 100 mL IVPB        2 g 200 mL/hr over 30 Minutes Intravenous Every 24 hours 03/16/24 0929 03/20/24 1219   03/16/24 1015  doxycycline  (VIBRA -TABS) tablet 100 mg        100 mg Oral Every 12 hours 03/16/24 0929 03/20/24 2131   03/14/24 1000  metroNIDAZOLE  (FLAGYL ) tablet 500 mg        500 mg Oral Every 12 hours 03/14/24 0803 03/19/24 2219   03/13/24 1000  cefadroxil  (DURICEF) capsule 1,000 mg  Status:  Discontinued        1,000 mg Oral 2 times daily 03/12/24 1632 03/16/24 0929   03/07/24 2200  metroNIDAZOLE  (FLAGYL ) tablet 500 mg        500 mg Oral Every 12 hours 03/07/24 0840 03/11/24 2258   03/05/24 1930  vancomycin  (VANCOREADY) IVPB 1250 mg/250 mL  Status:  Discontinued        1,250 mg 166.7 mL/hr over 90 Minutes Intravenous Every 24 hours 03/04/24 2205 03/04/24 2224   03/05/24 1930  Vancomycin  (VANCOCIN ) 1,250 mg in sodium chloride  0.9 % 250 mL IVPB  Status:  Discontinued        1,250 mg 166.7 mL/hr over 90 Minutes Intravenous Every 24 hours 03/04/24 2224 03/05/24 0717   03/05/24 1930  vancomycin  (VANCOREADY) IVPB 1500 mg/300 mL  Status:  Discontinued        1,500 mg 150 mL/hr over 120 Minutes Intravenous Every 24 hours 03/05/24 0717 03/12/24 1636   03/05/24 1400  ceFEPIme  (MAXIPIME ) 2 g in sodium chloride  0.9 % 100 mL IVPB  Status:  Discontinued        2 g 200 mL/hr over 30 Minutes Intravenous Every 8 hours 03/05/24 0717 03/12/24 1632   03/05/24 0600  ceFEPIme  (MAXIPIME ) 2 g in sodium chloride  0.9 % 100 mL IVPB  Status:  Discontinued        2 g 200 mL/hr over 30 Minutes Intravenous Every 12 hours 03/04/24 2205 03/05/24 0717   03/05/24 0000  metroNIDAZOLE  (FLAGYL ) IVPB 500 mg  Status:  Discontinued        500 mg 100 mL/hr over 60 Minutes Intravenous Every 12 hours 03/04/24 2206 03/04/24 2209   03/04/24 1730  aztreonam (AZACTAM) 2 g in sodium chloride  0.9 % 100 mL IVPB  Status:  Discontinued         2 g 200 mL/hr over 30 Minutes Intravenous  Once 03/04/24 1715 03/04/24 1724   03/04/24 1730  metroNIDAZOLE  (FLAGYL ) IVPB 500 mg  Status:  Discontinued        500 mg 100 mL/hr over 60 Minutes Intravenous Every 12 hours 03/04/24 1715 03/07/24 0840   03/04/24 1730  vancomycin  (VANCOREADY) IVPB 1500 mg/300 mL        1,500 mg 150 mL/hr over 120 Minutes Intravenous  Once 03/04/24 1715 03/04/24 2153   03/04/24 1730  ceFEPIme  (MAXIPIME ) 2 g in sodium chloride  0.9 % 100 mL IVPB        2 g 200 mL/hr over 30 Minutes Intravenous  Once 03/04/24 1724 03/04/24 1826         Subjective: Patient seen and examined at bedside.  Poor historian.  No seizures, agitation, fever or vomiting reported  objective: Vitals:   07/14/24 2049 07/15/24 0003 07/15/24 0455 07/15/24 0500  BP: 113/67 111/77 113/61   Pulse: 71 76 71   Resp: 20 19 20    Temp: 97.7 F (36.5 C) 97.9 F (36.6 C) 98.3 F (36.8 C)   TempSrc:  Oral Oral   SpO2: 97% 100% 99%   Weight:    68.9 kg  Height:        Intake/Output Summary (Last 24 hours) at 07/15/2024 0734 Last data filed at 07/15/2024 0512 Gross per 24 hour  Intake 976 ml  Output 1100 ml  Net -124 ml    Filed Weights   07/09/24 0320 07/13/24 0500 07/15/24 0500  Weight: 75.3 kg 76 kg 68.9 kg    Examination:  General: Remains on room air and in no distress.  Chronically ill and deconditioned looking. ENT/neck: No JVD elevation or palpable neck masses noted  respiratory: Bilateral decreased breath sounds at bases with some crackles CVS: S1-S2 heard; rate mostly controlled  abdominal: Soft, nontender, remains distended; no organomegaly, bowel sounds normally heard  extremities: Trace lower extremity edema; no cyanosis  CNS: Extremely poor historian; very slow to respond.  No focal deficits obvious lymph: No palpable lymphadenopathy noted skin: No obvious petechia/lesions  psych: Not agitated currently.  Affect remains extremely flat.  Data Reviewed: I have  personally reviewed following labs and imaging studies  CBC: Recent Labs  Lab 07/09/24 0433 07/10/24 0233  WBC 6.5 5.9  HGB 11.6* 11.3*  HCT 33.5* 33.1*  MCV 85.2 85.8  PLT 187 206   Basic Metabolic  Panel: Recent Labs  Lab 07/09/24 0433 07/10/24 0233  NA 141 143  K 3.7 3.5  CL 105 106  CO2 26 24  GLUCOSE 91 157*  BUN 28* 32*  CREATININE 0.87 0.78  CALCIUM  8.6* 8.4*  MG 1.9 1.9  PHOS 2.3* 2.2*   GFR: Estimated Creatinine Clearance: 68.2 mL/min (by C-G formula based on SCr of 0.78 mg/dL). Liver Function Tests: Recent Labs  Lab 07/09/24 0433 07/10/24 0233  AST 51* 41  ALT 43 37  ALKPHOS 152* 143*  BILITOT 2.1* 2.0*  PROT 5.5* 5.5*  ALBUMIN  2.7* 3.1*   No results for input(s): LIPASE, AMYLASE in the last 168 hours. No results for input(s): AMMONIA in the last 168 hours. Coagulation Profile: No results for input(s): INR, PROTIME in the last 168 hours. Cardiac Enzymes: No results for input(s): CKTOTAL, CKMB, CKMBINDEX, TROPONINI in the last 168 hours. BNP (last 3 results) No results for input(s): PROBNP in the last 8760 hours. HbA1C: No results for input(s): HGBA1C in the last 72 hours. CBG: Recent Labs  Lab 07/13/24 2053 07/14/24 0746 07/14/24 1131 07/14/24 1629 07/14/24 2050  GLUCAP 156* 108* 133* 157* 132*   Lipid Profile: No results for input(s): CHOL, HDL, LDLCALC, TRIG, CHOLHDL, LDLDIRECT in the last 72 hours. Thyroid  Function Tests: No results for input(s): TSH, T4TOTAL, FREET4, T3FREE, THYROIDAB in the last 72 hours. Anemia Panel: No results for input(s): VITAMINB12, FOLATE, FERRITIN, TIBC, IRON, RETICCTPCT in the last 72 hours. Sepsis Labs: No results for input(s): PROCALCITON, LATICACIDVEN in the last 168 hours.  No results found for this or any previous visit (from the past 240 hours).       Radiology Studies: No results found.       Scheduled Meds:  (feeding  supplement) PROSource Plus  30 mL Oral BID BM   amiodarone   200 mg Oral Daily   apixaban   5 mg Oral BID   calcium  carbonate  1 tablet Oral TID   collagenase    Topical Daily   diclofenac  Sodium  4 g Topical QID   feeding supplement  237 mL Oral BID BM   ferrous sulfate   325 mg Oral QHS   fluticasone   1 spray Each Nare Daily   furosemide   40 mg Oral Daily   guaiFENesin   600 mg Oral BID   insulin  aspart  0-9 Units Subcutaneous TID WC   insulin  glargine-yfgn  5 Units Subcutaneous Daily   levothyroxine   50 mcg Oral Q0600   lidocaine   1 patch Transdermal Q24H   lidocaine -EPINEPHrine  (PF)  30 mL Infiltration Once   liver oil-zinc  oxide   Topical BID   loratadine   10 mg Oral Daily   multivitamin with minerals  1 tablet Oral Daily   neomycin -bacitracin -polymyxin   Apply externally Daily   nutrition supplement (JUVEN)  1 packet Oral BID BM   mouth rinse  15 mL Mouth Rinse 4 times per day   pantoprazole   40 mg Oral Daily   polyethylene glycol  17 g Oral BID   senna-docusate  1 tablet Oral QHS   sertraline   50 mg Oral Daily   sodium chloride  flush  10-40 mL Intracatheter Q12H   Tafamidis   1 capsule Oral Daily   vitamin A   10,000 Units Oral Daily   vutrisiran  sodium  25 mg Subcutaneous Q90 days   Continuous Infusions:  dextrose             Sophie Mao, MD [facility] 07/15/2024, 7:34 AM

## 2024-07-16 DIAGNOSIS — R6520 Severe sepsis without septic shock: Secondary | ICD-10-CM | POA: Diagnosis not present

## 2024-07-16 DIAGNOSIS — A419 Sepsis, unspecified organism: Secondary | ICD-10-CM | POA: Diagnosis not present

## 2024-07-16 LAB — GLUCOSE, CAPILLARY
Glucose-Capillary: 82 mg/dL (ref 70–99)
Glucose-Capillary: 161 mg/dL — ABNORMAL HIGH (ref 70–99)
Glucose-Capillary: 107 mg/dL — ABNORMAL HIGH (ref 70–99)
Glucose-Capillary: 122 mg/dL — ABNORMAL HIGH (ref 70–99)

## 2024-07-16 NOTE — Plan of Care (Signed)
  Problem: Fluid Volume: Goal: Hemodynamic stability will improve Outcome: Progressing   Problem: Clinical Measurements: Goal: Diagnostic test results will improve Outcome: Progressing Goal: Signs and symptoms of infection will decrease Outcome: Progressing   Problem: Education: Goal: Knowledge of risk factors and measures for prevention of condition will improve Outcome: Progressing   Problem: Coping: Goal: Psychosocial and spiritual needs will be supported Outcome: Progressing   Problem: Respiratory: Goal: Will maintain a patent airway Outcome: Progressing Goal: Complications related to the disease process, condition or treatment will be avoided or minimized Outcome: Progressing   Problem: Education: Goal: Ability to describe self-care measures that may prevent or decrease complications (Diabetes Survival Skills Education) will improve Outcome: Progressing   Problem: Coping: Goal: Ability to adjust to condition or change in health will improve Outcome: Progressing   Problem: Fluid Volume: Goal: Ability to maintain a balanced intake and output will improve Outcome: Progressing   Problem: Health Behavior/Discharge Planning: Goal: Ability to manage health-related needs will improve Outcome: Progressing   Problem: Metabolic: Goal: Ability to maintain appropriate glucose levels will improve Outcome: Progressing   Problem: Nutritional: Goal: Maintenance of adequate nutrition will improve Outcome: Progressing Goal: Progress toward achieving an optimal weight will improve Outcome: Progressing   Problem: Skin Integrity: Goal: Risk for impaired skin integrity will decrease Outcome: Progressing   Problem: Tissue Perfusion: Goal: Adequacy of tissue perfusion will improve Outcome: Progressing   Problem: Education: Goal: Knowledge of General Education information will improve Description: Including pain rating scale, medication(s)/side effects and non-pharmacologic  comfort measures Outcome: Progressing   Problem: Health Behavior/Discharge Planning: Goal: Ability to manage health-related needs will improve Outcome: Progressing   Problem: Clinical Measurements: Goal: Ability to maintain clinical measurements within normal limits will improve Outcome: Progressing Goal: Will remain free from infection Outcome: Progressing Goal: Diagnostic test results will improve Outcome: Progressing Goal: Respiratory complications will improve Outcome: Progressing Goal: Cardiovascular complication will be avoided Outcome: Progressing   Problem: Activity: Goal: Risk for activity intolerance will decrease Outcome: Progressing   Problem: Nutrition: Goal: Adequate nutrition will be maintained Outcome: Progressing   Problem: Coping: Goal: Level of anxiety will decrease Outcome: Progressing   Problem: Elimination: Goal: Will not experience complications related to bowel motility Outcome: Progressing Goal: Will not experience complications related to urinary retention Outcome: Progressing   Problem: Pain Managment: Goal: General experience of comfort will improve and/or be controlled Outcome: Progressing   Problem: Safety: Goal: Ability to remain free from injury will improve Outcome: Progressing   Problem: Skin Integrity: Goal: Risk for impaired skin integrity will decrease Outcome: Progressing

## 2024-07-16 NOTE — Progress Notes (Signed)
 Mobility Specialist: Progress Note   07/16/24 1500  Mobility  Activity Stood at bedside  Level of Assistance +2 (takes two people)  Assistive Device Stedy  Activity Response Tolerated well  Mobility Referral Yes  Mobility visit 1 Mobility  Mobility Specialist Start Time (ACUTE ONLY) 1050  Mobility Specialist Stop Time (ACUTE ONLY) 1120  Mobility Specialist Time Calculation (min) (ACUTE ONLY) 30 min    Pt received in bed, agreeable to mobility session. Completed 5x STS from stedy with mod-maxA+2. Unable to fully extend knees with verbal and tactile cues. Returned pt to supine. Left in bed with all needs met, call bell in reach.   Ileana Lute Mobility Specialist Please contact via SecureChat or Rehab office at [phone]

## 2024-07-16 NOTE — Consult Note (Signed)
 WOC Nurse wound follow up Wound type: Unstageable pressure injury left shoulder Stage 4 pressure injury: left trochanter Unstageable Pressure Injury; right heel Stage 4 Pressure Injury; sacrum Measurement: L shoulder: 1 cm x 1.5 cm Left trochanter: 4 cm x 3 cm x 3 cm with 1 cm undermining from 1 o'clock to 4 o'clock R heel: 4 cm x 4 cm  Sacrum: 6 cm x 3 cm x 0.5 cm with 2 cm undermining from 12 o'clock to 3 o'clock  Wound bed: L shoulder: 100% covered by yellow slough Left trochanter:  50% covered yellow slough, 50% red, moist R heel: 100% covered by black eschar, boggy with lifting at edges Sacrum: 50% red, moist, 50% scattered yellow slough Drainage: Left shoulder: serosanguinous Left trochanter: serosanguinous Right heel: serosanguinous  Sacrum: purulent  Periwound: Intact Dressing procedure/placement/frequency: 1- Left shoulder Unstageable  Cleanse with NS, pat dry, Apply 1/4 thick layer of Santyl  to wound bed, top with saline moist gauze. Top with silicone foam.  Change daily. 2- Left trochanter: NPWT with weekly peel and place dressing.  3 - Sacral Stage 4 Pressure Injury  Cleanse with Vashe#[id number]. Apply Aquacel Ag+ (silver hydrofiber) Lawson # [id number] in the wound bed and fill the undermining using a swab, cover with sacral foam dressing, change daily and PRN soilage  3- Right heel; Unstageable; Cleanse with NS, pat dry.  Apply  Aquacel Ag+ (silver hydrofiber) Lawson # [id number] and cover with silicone foam dressing. Change daily.  Off load left heel with Prevalon boots at all times.   LALM in place for moisture management and pressure redistribution.  Left trochanter:  Removed old dressing Filled wound with __1__ piece of white foam, covered with peel and place dressing  Sealed NPWT dressing at HG Patient tolerated procedure well  WOC nurse will continue to provide NPWT dressing changed due to the complexity of the dressing change.   Plan of care discussed with  patient's daughter, Para Mcnulty, via telephone.  WOC Nurse team will follow with you and see patient within 10 days for wound assessments.  Please notify WOC nurses of any acute changes in the wounds or any new areas of concern  Thank you,  Doyal Polite, RN, MSN, [facility] WOC Team [phone] (Available Mon-Fri 0700-1500)

## 2024-07-16 NOTE — Progress Notes (Signed)
 Physical Therapy Discharge Patient Details Name: Earl [name] MRN: [id number] DOB: May 29, 1941 Today's Date: 07/16/2024     Patient discharged from PT hydrotherapy services secondary to wound on pt L hip is now being treated with a wound vac.  Please see latest PT hydrotherapy progress note for wound status prior to wound vac application .    Pt educated on discharge from Hydrotherapy. PT signing off on L hip wound care.   Please reorder as necessary.   Earl [name] B. [name] PT, DPT Acute Rehabilitation Services Please use secure chat or  Call Office [phone]   GP     Earl [name] 07/16/2024, 1:52 PM

## 2024-07-16 NOTE — Progress Notes (Signed)
 PROGRESS NOTE    Earl [name]  FMW:[id number] DOB: 11-07-1941 DOA: 03/04/2024 PCP: [name], MD   Brief Narrative:  83 year old M with PMH of PAF on Eliquis , HFmrEF, amyloid cardiomyopathy, NIDDM-2, CVA with right-sided weakness, stage IV sacral wound, pancytopenia, MGUS, HTN, HLD and hypothyroidism brought to ED by EMS from [facility] due to pain in sacral area and shortness of breath 4 days after he was discharged from the hospital.  Patient was hospitalized from 3/26-4/10 with generalized weakness, encephalopathy and COVID-19 infection.  Per daughter, patient sustained skin injury during this hospitalization, and he had significant pain, discomfort and progressive foul-smelling discharge from sacral wound over the course of 4 to 5 days before coming back to ED. he was admitted due to persistent wounds.  General surgery, orthopedic surgery, ID all consulted.   See plan and assessment for more on hospital course.  Significant imaging / results / micro data: 4/14-COVID-19 PCR positive.  Flu and RSV PCR nonreactive. 4/14-blood cultures NGTD 6/4-sacral wound culture with Pseudomonas aeruginosa and Klebsiella pneumoniae 7/14-pleural fluid culture NGTD  Assessment & Plan:   Stage IV sacral decubitus wound/ulcer with severe sepsis on admission  - Culture data as above. -4/14-CT showed sacral wound extension to the right gluteus muscle without signs of osteomyelitis. -4/15-underwent bedside debridement of sacral decubitus wound by general surgery -4/21-ID consulted and recommended continuing broad-spectrum antibiotics that he completed on 6/13. 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 -6/17- ID recommended continuing holding off antibiotics since he is hemodynamically stable, afebrile without leukocytosis. -6/17-evaluated by plastic surgery.  Sacral wound felt to be clean.  Wound care  recommended -7/13-7/13-CT showed similar appearance of osteomyelitis underlying sacral decubitus ulcer.  -7/13-plastic surgery recommended for continued wound care and no surgical management. -7/14-Ortho evaluated patient for left hip wound.  Debulked and recommended optimizing nutrition -8/5- Surgery recommended hydrotherapy for left hip wound. Surgical debridement if not successful -8/5-wound care and instruction updated  -8/15-left hip wound worse with tunneling. Daughter concerned about this.  X-ray with left lateral hip soft tissue defect.  No acute bony abnormality.  -Overall, difficult case due to patient's nutritional status and limited mobility.  Prealbumin <5. - CT scan of the hip 8/15 with new left hip wound which extends deep, 2.8 cm without evidence of acute osteolysis involving the underlying left greater trochanter.  Ortho was reconsulted on 07/12/2024: Recommended conservative management.  Continue wound care as per wound care consult recommendations. - The pads on top of the heel wound are not sticking well due to dry and flaky skin.  Discussed with the bedside RN to put the foam pads and then wrap the foot and ankle to prevent them from sliding follow wound care consult recommendations.  Severe sepsis-present on admission.  Resolved.   Aspiration pneumonia-patient with worsening congestion, increased cough on 8/18.  Chest x-ray 8/18 with lower lobes collapsed and possible underlying pneumonia, likely due to being in bed for prolonged amount of time.  Completed 5 days of Rocephin  and Flagyl  on 07/12/2024. - Diet as per SLP's recommendations   Acute on chronic combined CHF, cardiac amyloidosis, nonischemic cardiomyopathy, hypotension - on tafamidis  and vutrisiran  for amyloid.  Goal-directed medical therapy somewhat limited by prior episodes of hypotension.   - Has received few doses of IV Lasix  recently along with IV albumin .  Currently on oral Lasix . -Continue Tafamidis  and  vutrisiran . -Ace wrap for BLE edema -Strict intake and output, daily weights, renal functions and electrolytes  Elevated LFTs  - Improving.  Lipitor still on hold.  Monitor intermittently.   Acute on chronic pain-continue current pain management with lidocaine  patch and as needed oxycodone    PAF-continue amiodarone  and Eliquis .  Currently rate controlled.  No bleeding   Anemia of chronic disease-from chronic illnesses.  Monitor intermittently.  No signs of bleeding.  Hemoglobin currently stable.  History of CVA-stable, on Eliquis , holding Lipitor due to LFTs   Concern for acute cholecystitis-ruled out, right upper quadrant ultrasound initially raise concern, however HIDA scan was negative.  This is likely due to anasarca/third spacing.     Dysphagia/severe protein calorie malnutrition-continue supplementation, pured diet.  Follow-up SLP and nutrition recommendations   Physical debilitation, muscle weakness-PT/OT ongoing   Goals of care - Daughter wants him to transition to home once his wounds are better.  Prior hospitalist discussed with daughter on 07/07/24 that his wounds might not improve.  Daughter does not want SNF or LTAC.  Palliative care following intermittently.  Patient is DNR.  hypothyroidism -Continue levothyroxine    Anxiety and adjustment disorder -Psychiatry consulted on 7/7 and recommended continuing Zoloft .  Psychiatry has signed off.   IDDM -continue Semglee , sliding scale, Accu-Cheks   Multiple pressure skin injuries: Present on admission.  Follow wound care consult recommendations. Pressure Injury 02/26/24 Coccyx Mid Stage 4 - Full thickness tissue loss with exposed bone, tendon or muscle. (Active)  02/26/24 0916  Location: Coccyx  Location Orientation: Mid  Staging: Stage 4 - Full thickness tissue loss with exposed bone, tendon or muscle.  Wound Description (Comments):   DO NOT USE:  Present on Admission: Yes     Pressure Injury 02/26/24 Toe (Comment  which  one) Anterior;Right Deep Tissue Pressure Injury - Purple or maroon localized area of discolored intact skin or blood-filled blister due to damage of underlying soft tissue from pressure and/or shear. right gre (Active)  02/26/24 1215  Location: Toe (Comment  which one)  Location Orientation: Anterior;Right  Staging: Deep Tissue Pressure Injury - Purple or maroon localized area of discolored intact skin or blood-filled blister due to damage of underlying soft tissue from pressure and/or shear.  Wound Description (Comments): right great toe  DO NOT USE:  Present on Admission:      Pressure Injury 03/06/24 Hip Anterior;Left;Proximal Unstageable - Full thickness tissue loss in which the base of the injury is covered by slough (yellow, tan, gray, green or brown) and/or eschar (tan, brown or black) in the wound bed. unstageable (Active)  03/06/24 1612  Location: Hip  Location Orientation: Anterior;Left;Proximal  Staging: Unstageable - Full thickness tissue loss in which the base of the injury is covered by slough (yellow, tan, gray, green or brown) and/or eschar (tan, brown or black) in the wound bed.  Wound Description (Comments): unstageable  DO NOT USE:  Present on Admission: No     Pressure Injury 03/06/24 Shoulder Left Unstageable - Full thickness tissue loss in which the base of the injury is covered by slough (yellow, tan, gray, green or brown) and/or eschar (tan, brown or black) in the wound bed. unstageable (Active)  03/06/24 1613  Location: Shoulder  Location Orientation: Left  Staging: Unstageable - Full thickness tissue loss in which the base of the injury is covered by slough (yellow, tan, gray, green or brown) and/or eschar (tan, brown or black) in the wound bed.  Wound Description (Comments): unstageable  DO NOT USE:  Present on Admission: No     Pressure Injury 04/23/24 Heel Right Unstageable - Full  thickness tissue loss in which the base of the injury is covered by slough (yellow,  tan, gray, green or brown) and/or eschar (tan, brown or black) in the wound bed. DTPI evolving into unstageable (Active)  04/23/24   Location: Heel  Location Orientation: Right  Staging: Unstageable - Full thickness tissue loss in which the base of the injury is covered by slough (yellow, tan, gray, green or brown) and/or eschar (tan, brown or black) in the wound bed.  Wound Description (Comments): DTPI evolving into unstageable  DO NOT USE:  Present on Admission: No     Pressure Injury 04/23/24 Heel Left Deep Tissue Pressure Injury - Purple or maroon localized area of discolored intact skin or blood-filled blister due to damage of underlying soft tissue from pressure and/or shear. (Active)  04/23/24   Location: Heel  Location Orientation: Left  Staging: Deep Tissue Pressure Injury - Purple or maroon localized area of discolored intact skin or blood-filled blister due to damage of underlying soft tissue from pressure and/or shear.  Wound Description (Comments):   DO NOT USE:  Present on Admission: No       DVT prophylaxis: Eliquis  Code Status: DNR Family Communication: None at bedside Disposition Plan: Status is: Inpatient Remains inpatient appropriate because: Of severity of illness  Consultants:  PCCM General Surgery Cardiology Infectious disease Plastic surgery Orthopedic surgery Palliative medicine  Procedures: As above  Antimicrobials:  Anti-infectives (From admission, onward)    Start     Dose/Rate Route Frequency Ordered Stop   07/08/24 1045  metroNIDAZOLE  (FLAGYL ) tablet 500 mg        500 mg Oral Every 12 hours 07/08/24 0951 07/12/24 2117   07/08/24 1045  cefTRIAXone  (ROCEPHIN ) 2 g in sodium chloride  0.9 % 100 mL IVPB        2 g 200 mL/hr over 30 Minutes Intravenous Every 24 hours 07/08/24 0951 07/12/24 1249   04/27/24 1615  cefTAZidime  (FORTAZ ) 2 g in sodium chloride  0.9 % 100 mL IVPB        2 g 200 mL/hr over 30 Minutes Intravenous Every 8 hours 04/27/24  1518 05/03/24 2359   03/16/24 1015  cefTRIAXone  (ROCEPHIN ) 2 g in sodium chloride  0.9 % 100 mL IVPB        2 g 200 mL/hr over 30 Minutes Intravenous Every 24 hours 03/16/24 0929 03/20/24 1219   03/16/24 1015  doxycycline  (VIBRA -TABS) tablet 100 mg        100 mg Oral Every 12 hours 03/16/24 0929 03/20/24 2131   03/14/24 1000  metroNIDAZOLE  (FLAGYL ) tablet 500 mg        500 mg Oral Every 12 hours 03/14/24 0803 03/19/24 2219   03/13/24 1000  cefadroxil  (DURICEF) capsule 1,000 mg  Status:  Discontinued        1,000 mg Oral 2 times daily 03/12/24 1632 03/16/24 0929   03/07/24 2200  metroNIDAZOLE  (FLAGYL ) tablet 500 mg        500 mg Oral Every 12 hours 03/07/24 0840 03/11/24 2258   03/05/24 1930  vancomycin  (VANCOREADY) IVPB 1250 mg/250 mL  Status:  Discontinued        1,250 mg 166.7 mL/hr over 90 Minutes Intravenous Every 24 hours 03/04/24 2205 03/04/24 2224   03/05/24 1930  Vancomycin  (VANCOCIN ) 1,250 mg in sodium chloride  0.9 % 250 mL IVPB  Status:  Discontinued        1,250 mg 166.7 mL/hr over 90 Minutes Intravenous Every 24 hours 03/04/24 2224 03/05/24 0717   03/05/24 1930  vancomycin  (VANCOREADY) IVPB 1500 mg/300 mL  Status:  Discontinued        1,500 mg 150 mL/hr over 120 Minutes Intravenous Every 24 hours 03/05/24 0717 03/12/24 1636   03/05/24 1400  ceFEPIme  (MAXIPIME ) 2 g in sodium chloride  0.9 % 100 mL IVPB  Status:  Discontinued        2 g 200 mL/hr over 30 Minutes Intravenous Every 8 hours 03/05/24 0717 03/12/24 1632   03/05/24 0600  ceFEPIme  (MAXIPIME ) 2 g in sodium chloride  0.9 % 100 mL IVPB  Status:  Discontinued        2 g 200 mL/hr over 30 Minutes Intravenous Every 12 hours 03/04/24 2205 03/05/24 0717   03/05/24 0000  metroNIDAZOLE  (FLAGYL ) IVPB 500 mg  Status:  Discontinued        500 mg 100 mL/hr over 60 Minutes Intravenous Every 12 hours 03/04/24 2206 03/04/24 2209   03/04/24 1730  aztreonam (AZACTAM) 2 g in sodium chloride  0.9 % 100 mL IVPB  Status:  Discontinued         2 g 200 mL/hr over 30 Minutes Intravenous  Once 03/04/24 1715 03/04/24 1724   03/04/24 1730  metroNIDAZOLE  (FLAGYL ) IVPB 500 mg  Status:  Discontinued        500 mg 100 mL/hr over 60 Minutes Intravenous Every 12 hours 03/04/24 1715 03/07/24 0840   03/04/24 1730  vancomycin  (VANCOREADY) IVPB 1500 mg/300 mL        1,500 mg 150 mL/hr over 120 Minutes Intravenous  Once 03/04/24 1715 03/04/24 2153   03/04/24 1730  ceFEPIme  (MAXIPIME ) 2 g in sodium chloride  0.9 % 100 mL IVPB        2 g 200 mL/hr over 30 Minutes Intravenous  Once 03/04/24 1724 03/04/24 1826         Subjective: Patient seen and examined at bedside.  Poor historian.  No fever, vomiting, agitation reported.   Objective: Vitals:   07/15/24 2053 07/15/24 2343 07/16/24 0403 07/16/24 0500  BP: 98/65 107/63 103/63   Pulse: 73 67 68   Resp: 18 18 18    Temp: 97.9 F (36.6 C) 97.9 F (36.6 C) 98.2 F (36.8 C)   TempSrc: Oral     SpO2: 98% 99% 96%   Weight:    69.4 kg  Height:        Intake/Output Summary (Last 24 hours) at 07/16/2024 0731 Last data filed at 07/16/2024 0602 Gross per 24 hour  Intake 758 ml  Output 1300 ml  Net -542 ml    Filed Weights   07/13/24 0500 07/15/24 0500 07/16/24 0500  Weight: 76 kg 68.9 kg 69.4 kg    Examination:  General: Currently on room air and apparently no distress.  Chronically ill and deconditioned looking. ENT/neck: No palpable thyromegaly or elevated JVD noted  respiratory: Decreased breath sounds at bases bilaterally with scattered crackles  CVS: Rate currently controlled; S1 and S2 are heard  abdominal: Soft, nontender, distended slightly; no organomegaly, normal bowel sounds heard  extremities: No clubbing; mild lower extremity edema present CNS: Still very slow to respond and a poor historian.  No focal deficits noted lymph: No cervical lymphadenopathy skin: No obvious ecchymosis/rashes  psych: Mostly flat affect with no signs of agitation currently.  Data Reviewed: I  have personally reviewed following labs and imaging studies  CBC: Recent Labs  Lab 07/10/24 0233  WBC 5.9  HGB 11.3*  HCT 33.1*  MCV 85.8  PLT 206   Basic Metabolic Panel: Recent Labs  Lab 07/10/24 0233  NA 143  K 3.5  CL 106  CO2 24  GLUCOSE 157*  BUN 32*  CREATININE 0.78  CALCIUM  8.4*  MG 1.9  PHOS 2.2*   GFR: Estimated Creatinine Clearance: 68.7 mL/min (by C-G formula based on SCr of 0.78 mg/dL). Liver Function Tests: Recent Labs  Lab 07/10/24 0233  AST 41  ALT 37  ALKPHOS 143*  BILITOT 2.0*  PROT 5.5*  ALBUMIN  3.1*   No results for input(s): LIPASE, AMYLASE in the last 168 hours. No results for input(s): AMMONIA in the last 168 hours. Coagulation Profile: No results for input(s): INR, PROTIME in the last 168 hours. Cardiac Enzymes: No results for input(s): CKTOTAL, CKMB, CKMBINDEX, TROPONINI in the last 168 hours. BNP (last 3 results) No results for input(s): PROBNP in the last 8760 hours. HbA1C: No results for input(s): HGBA1C in the last 72 hours. CBG: Recent Labs  Lab 07/14/24 2050 07/15/24 0800 07/15/24 1221 07/15/24 1623 07/15/24 2051  GLUCAP 132* 86 134* 181* 114*   Lipid Profile: No results for input(s): CHOL, HDL, LDLCALC, TRIG, CHOLHDL, LDLDIRECT in the last 72 hours. Thyroid  Function Tests: No results for input(s): TSH, T4TOTAL, FREET4, T3FREE, THYROIDAB in the last 72 hours. Anemia Panel: No results for input(s): VITAMINB12, FOLATE, FERRITIN, TIBC, IRON, RETICCTPCT in the last 72 hours. Sepsis Labs: No results for input(s): PROCALCITON, LATICACIDVEN in the last 168 hours.  No results found for this or any previous visit (from the past 240 hours).       Radiology Studies: No results found.       Scheduled Meds:  (feeding supplement) PROSource Plus  30 mL Oral BID BM   amiodarone   200 mg Oral Daily   apixaban   5 mg Oral BID   calcium  carbonate  1 tablet Oral  TID   collagenase    Topical Daily   diclofenac  Sodium  4 g Topical QID   feeding supplement  237 mL Oral BID BM   ferrous sulfate   325 mg Oral QHS   fluticasone   1 spray Each Nare Daily   furosemide   40 mg Oral Daily   guaiFENesin   600 mg Oral BID   insulin  aspart  0-9 Units Subcutaneous TID WC   insulin  glargine-yfgn  5 Units Subcutaneous Daily   levothyroxine   50 mcg Oral Q0600   lidocaine   1 patch Transdermal Q24H   lidocaine -EPINEPHrine  (PF)  30 mL Infiltration Once   liver oil-zinc  oxide   Topical BID   loratadine   10 mg Oral Daily   multivitamin with minerals  1 tablet Oral Daily   neomycin -bacitracin -polymyxin   Apply externally Daily   nutrition supplement (JUVEN)  1 packet Oral BID BM   mouth rinse  15 mL Mouth Rinse 4 times per day   pantoprazole   40 mg Oral Daily   polyethylene glycol  17 g Oral BID   senna-docusate  1 tablet Oral QHS   sertraline   50 mg Oral Daily   sodium chloride  flush  10-40 mL Intracatheter Q12H   Tafamidis   1 capsule Oral Daily   vitamin A   10,000 Units Oral Daily   vutrisiran  sodium  25 mg Subcutaneous Q90 days   Continuous Infusions:  dextrose             Sophie Mao, MD [facility] 07/16/2024, 7:31 AM

## 2024-07-17 ENCOUNTER — Other Ambulatory Visit (HOSPITAL_COMMUNITY): Payer: Self-pay | Admitting: Cardiology

## 2024-07-17 DIAGNOSIS — A419 Sepsis, unspecified organism: Secondary | ICD-10-CM | POA: Diagnosis not present

## 2024-07-17 DIAGNOSIS — R6520 Severe sepsis without septic shock: Secondary | ICD-10-CM | POA: Diagnosis not present

## 2024-07-17 LAB — CBC WITH DIFFERENTIAL/PLATELET
Abs Immature Granulocytes: 0.04 K/uL (ref 0.00–0.07)
Basophils Absolute: 0.1 K/uL (ref 0.0–0.1)
Basophils Relative: 1 %
Eosinophils Absolute: 0.1 K/uL (ref 0.0–0.5)
Eosinophils Relative: 2 %
HCT: 34.9 % — ABNORMAL LOW (ref 39.0–52.0)
Hemoglobin: 11.7 g/dL — ABNORMAL LOW (ref 13.0–17.0)
Immature Granulocytes: 1 %
Lymphocytes Relative: 22 %
Lymphs Abs: 1.2 K/uL (ref 0.7–4.0)
MCH: 29.3 pg (ref 26.0–34.0)
MCHC: 33.5 g/dL (ref 30.0–36.0)
MCV: 87.5 fL (ref 80.0–100.0)
Monocytes Absolute: 0.7 K/uL (ref 0.1–1.0)
Monocytes Relative: 13 %
Neutro Abs: 3.2 K/uL (ref 1.7–7.7)
Neutrophils Relative %: 61 %
Platelets: 267 K/uL (ref 150–400)
RBC: 3.99 MIL/uL — ABNORMAL LOW (ref 4.22–5.81)
RDW: 19.9 % — ABNORMAL HIGH (ref 11.5–15.5)
WBC: 5.2 K/uL (ref 4.0–10.5)
nRBC: 0 % (ref 0.0–0.2)

## 2024-07-17 LAB — COMPREHENSIVE METABOLIC PANEL WITH GFR
ALT: 28 U/L (ref 0–44)
AST: 37 U/L (ref 15–41)
Albumin: 2.4 g/dL — ABNORMAL LOW (ref 3.5–5.0)
Alkaline Phosphatase: 145 U/L — ABNORMAL HIGH (ref 38–126)
Anion gap: 7 (ref 5–15)
BUN: 29 mg/dL — ABNORMAL HIGH (ref 8–23)
CO2: 27 mmol/L (ref 22–32)
Calcium: 8.3 mg/dL — ABNORMAL LOW (ref 8.9–10.3)
Chloride: 109 mmol/L (ref 98–111)
Creatinine, Ser: 0.78 mg/dL (ref 0.61–1.24)
GFR, Estimated: >60 mL/min (ref >60)
Glucose, Bld: 67 mg/dL — ABNORMAL LOW (ref 70–99)
Potassium: 3.8 mmol/L (ref 3.5–5.1)
Sodium: 143 mmol/L (ref 135–145)
Total Bilirubin: 1.3 mg/dL — ABNORMAL HIGH (ref 0.0–1.2)
Total Protein: 5.2 g/dL — ABNORMAL LOW (ref 6.5–8.1)

## 2024-07-17 LAB — GLUCOSE, CAPILLARY
Glucose-Capillary: 62 mg/dL — ABNORMAL LOW (ref 70–99)
Glucose-Capillary: 80 mg/dL (ref 70–99)
Glucose-Capillary: 136 mg/dL — ABNORMAL HIGH (ref 70–99)
Glucose-Capillary: 146 mg/dL — ABNORMAL HIGH (ref 70–99)
Glucose-Capillary: 157 mg/dL — ABNORMAL HIGH (ref 70–99)
Glucose-Capillary: 153 mg/dL — ABNORMAL HIGH (ref 70–99)

## 2024-07-17 LAB — MAGNESIUM: Magnesium: 2 mg/dL (ref 1.7–2.4)

## 2024-07-17 MED ORDER — DEXTROSE 50 % IV SOLN
1.0000 | Freq: Once | INTRAVENOUS | Status: AC
  Administered 2024-07-17: 50 mL via INTRAVENOUS
  Filled 2024-07-17: qty 50

## 2024-07-17 MED ORDER — SORBITOL 70 % SOLN
30.0000 mL | Freq: Every day | Status: DC
  Administered 2024-07-17 – 2024-07-26 (×9): 30 mL via ORAL
  Filled 2024-07-17 (×13): qty 30

## 2024-07-17 NOTE — Progress Notes (Signed)
 [facility]   ROUNDING   NOTE Earl [name] FMW:[id number]  DOB: Dec 18, 1940  DOA: 03/04/2024  PCP: [name], MD  07/17/2024,5:32 PM  LOS: 135 days    Code Status: DNR limited code     from: Home current Dispo: Unclear    83 year old black male DM TY 2 Chronic A-fib CHADVASC >4 on Eliquis  he HTN HLD DM TY 2 with labile sugars A1c 8.4 in the past MGUS diagnosed in 2004 last seen by Dr. 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 additionally Cardiac amyloid previously on antitransthyretin injection every 3 monthly/baclofen   He had just been discharged from the hospital 3/26-4/10 with generalized weakness and baclofen  was discontinued because of his encephalopathy and other issues including COVID Apparently there was a skin injury with significant pain discomfort which progressed 4 to 5 days before coming back to the ED General Surgery orthopedics and ID all were consulted    Plan  Stage IV sacral decubiti See summarization of events   Stage IV sacral decubitus wound/ulcer with severe sepsis on admission  - Culture data as above. -4/14-CT showed sacral wound extension to the right gluteus muscle without signs of osteomyelitis. -4/15-underwent bedside debridement of sacral decubitus wound by general surgery -4/21-ID consulted and recommended continuing broad-spectrum antibiotics that he completed on 6/13. 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 -6/17- ID recommended continuing holding off antibiotics since he is hemodynamically stable, afebrile without leukocytosis. -6/17-evaluated by plastic surgery.  Sacral wound felt to be clean.  Wound care recommended -7/13-7/13-CT showed similar appearance of osteomyelitis underlying sacral decubitus ulcer.  -7/13-plastic surgery recommended for continued wound care and no surgical  management. -7/14-Ortho evaluated patient for left hip wound.  Debulked and recommended optimizing nutrition -8/5- Surgery recommended hydrotherapy for left hip wound. Surgical debridement if not successful -8/5-wound care and instruction updated  -8/15-left hip wound worse with tunneling. Daughter concerned about this.  X-ray with left lateral hip soft tissue defect.  No acute bony abnormality.  -Overall, difficult case due to patient's nutritional status and limited mobility.  Prealbumin <5. - CT scan of the hip 8/15 with new left hip wound which extends deep, 2.8 cm without evidence of acute osteolysis involving the underlying left greater trochanter.  Ortho was reconsulted on 07/12/2024: Recommended conservative management.  Continue wound care as per wound care consult recommendations. - The pads on top of the heel wound are not sticking well due to dry and flaky skin.  Discussed with the bedside RN to put the foam pads and then wrap the foot and ankle to prevent them from sliding follow wound care consult recommendations.   Very poor surgical candidate per orthopedics Dr. [name] who saw him and recommended packing wound with 4 x 4 gauze soaked in Vashe--last wound care note documented 8/26-unstageable pressure injury left shoulder stage IV left trochanter stage IV sacrum unstageable right heel  Hypoglycemia NOS Discontinued all long-acting coverage only keep sliding scale given hypoglycemia this morning  Severe sepsis on admission Present on admission and likely secondary to infected area  Aspiration pneumonia on 8/18 Completed 5 days of Rocephin  and Flagyl  on 8/22 There is apparently some difficulty in obtaining the right supplement to prevent aspiration and I have asked speech therapy to reeval tomorrow and liberalized to thin liquids as they do not have the supplies to thicken the liquids per daughter  Cardiac amyloid nonischemic cardiomyopathy Amyloidosis Continues on  tafamidis /Vutusiran---recent goal-directed therapy for heart issues is limited Currently remains on Lasix  40 p.o. Is -7.8 L  Permanent A-fib continues on amiodarone  200 daily as well as Eliquis  5 twice daily  Transaminitis Resolved--LFTs held because of transaminitis  Acute superimposed on chronic pain Continues on Tylenol  650 every 6 as needed mild pain Oxy IR 2.5 every 4 as needed severe pain  Anxiety and adjustment disorder consulted psychiatry on 7/725 and is currently on Zoloft  50  CVA 2018 Dysphagia Protein energy malnutrition severe continue supplementation.  Diet SLP occasional visiting and patient currently on dysphagia 3 nectar thick liquid with straw Has been working with mobility regularly     Data Reviewed:   Sodium 143 potassium 3.8 BUN/creatinine 29/0.7 AST/ALT 37/28 WBC 5.2 hemoglobin 11.7 platelet 267  DVT prophylaxis: Eliquis   Status is: Inpatient Remains inpatient appropriate because:   Await dispo planning     Subjective:   Seems okay celebrating his wife's 50th anniversary with daughter at the bedside Daughter has several good points and questions about nutrition and the thickening agent that is not available I have asked speech therapy to come by and discussed options with her Overall he has a little bit of discomfort He tells me that he has hard ball-like stools and I discussed the risk and benefit of loose stools in a patient with stage IV decubiti and we will add a different stool softener    Objective + exam Vitals:   07/17/24 0552 07/17/24 0726 07/17/24 1222 07/17/24 1647  BP: 104/60 (!) 109/59 118/67 110/74  Pulse: 72 71 77 62  Resp: 17     Temp: (!) 97.4 F (36.3 C) 97.9 F (36.6 C)  (!) 97.4 F (36.3 C)  TempSrc:    Oral  SpO2: 96% 97% 100% 100%  Weight:      Height:       Filed Weights   07/13/24 0500 07/15/24 0500 07/16/24 0500  Weight: 76 kg 68.9 kg 69.4 kg     Examination: Cachectic black male no  distress Bitemporal supraclavicular wasting S1-S2?  Murmur Abdomen soft no guarding slight tenderness upper quadrant Decubiti not visualized today he does have a PR AFO on the left leg    Scheduled Meds:  (feeding supplement) PROSource Plus  30 mL Oral BID BM   amiodarone   200 mg Oral Daily   apixaban   5 mg Oral BID   calcium  carbonate  1 tablet Oral TID   collagenase    Topical Daily   diclofenac  Sodium  4 g Topical QID   feeding supplement  237 mL Oral BID BM   ferrous sulfate   325 mg Oral QHS   fluticasone   1 spray Each Nare Daily   furosemide   40 mg Oral Daily   guaiFENesin   600 mg Oral BID   insulin  aspart  0-9 Units Subcutaneous TID WC   levothyroxine   50 mcg Oral Q0600   lidocaine   1 patch Transdermal Q24H   lidocaine -EPINEPHrine  (PF)  30 mL Infiltration Once   liver oil-zinc  oxide   Topical BID   loratadine   10 mg Oral Daily   multivitamin with minerals  1 tablet Oral Daily   neomycin -bacitracin -polymyxin   Apply externally Daily   nutrition supplement (JUVEN)  1 packet Oral BID BM   mouth rinse  15 mL Mouth Rinse 4 times per day   pantoprazole   40 mg Oral Daily   polyethylene glycol  17 g Oral BID   senna-docusate  1 tablet Oral QHS   sertraline   50 mg Oral Daily   sodium chloride  flush  10-40 mL Intracatheter Q12H   Tafamidis   1 capsule Oral Daily   vitamin A   10,000 Units Oral Daily   vutrisiran  sodium  25 mg Subcutaneous Q90 days   Continuous Infusions:  dextrose       Time 45  Jai-Gurmukh Romana Deaton, MD  [facility]

## 2024-07-17 NOTE — Progress Notes (Signed)
 Wound care and dressing changed done per provider's orders.

## 2024-07-17 NOTE — Plan of Care (Signed)
  Problem: Fluid Volume: Goal: Hemodynamic stability will improve Outcome: Progressing   Problem: Clinical Measurements: Goal: Diagnostic test results will improve Outcome: Progressing Goal: Signs and symptoms of infection will decrease Outcome: Progressing   Problem: Education: Goal: Knowledge of risk factors and measures for prevention of condition will improve Outcome: Progressing   Problem: Coping: Goal: Psychosocial and spiritual needs will be supported Outcome: Progressing   Problem: Respiratory: Goal: Will maintain a patent airway Outcome: Progressing Goal: Complications related to the disease process, condition or treatment will be avoided or minimized Outcome: Progressing   Problem: Education: Goal: Ability to describe self-care measures that may prevent or decrease complications (Diabetes Survival Skills Education) will improve Outcome: Progressing   Problem: Coping: Goal: Ability to adjust to condition or change in health will improve Outcome: Progressing   Problem: Fluid Volume: Goal: Ability to maintain a balanced intake and output will improve Outcome: Progressing   Problem: Health Behavior/Discharge Planning: Goal: Ability to manage health-related needs will improve Outcome: Progressing   Problem: Metabolic: Goal: Ability to maintain appropriate glucose levels will improve Outcome: Progressing   Problem: Nutritional: Goal: Maintenance of adequate nutrition will improve Outcome: Progressing Goal: Progress toward achieving an optimal weight will improve Outcome: Progressing   Problem: Skin Integrity: Goal: Risk for impaired skin integrity will decrease Outcome: Progressing   Problem: Tissue Perfusion: Goal: Adequacy of tissue perfusion will improve Outcome: Progressing   Problem: Education: Goal: Knowledge of General Education information will improve Description: Including pain rating scale, medication(s)/side effects and non-pharmacologic  comfort measures Outcome: Progressing   Problem: Health Behavior/Discharge Planning: Goal: Ability to manage health-related needs will improve Outcome: Progressing   Problem: Clinical Measurements: Goal: Ability to maintain clinical measurements within normal limits will improve Outcome: Progressing Goal: Will remain free from infection Outcome: Progressing Goal: Diagnostic test results will improve Outcome: Progressing Goal: Respiratory complications will improve Outcome: Progressing Goal: Cardiovascular complication will be avoided Outcome: Progressing   Problem: Activity: Goal: Risk for activity intolerance will decrease Outcome: Progressing   Problem: Nutrition: Goal: Adequate nutrition will be maintained Outcome: Progressing   Problem: Coping: Goal: Level of anxiety will decrease Outcome: Progressing   Problem: Elimination: Goal: Will not experience complications related to bowel motility Outcome: Progressing Goal: Will not experience complications related to urinary retention Outcome: Progressing   Problem: Pain Managment: Goal: General experience of comfort will improve and/or be controlled Outcome: Progressing   Problem: Safety: Goal: Ability to remain free from injury will improve Outcome: Progressing   Problem: Skin Integrity: Goal: Risk for impaired skin integrity will decrease Outcome: Progressing

## 2024-07-18 ENCOUNTER — Ambulatory Visit: Payer: Medicare Other | Admitting: Nurse Practitioner

## 2024-07-18 ENCOUNTER — Inpatient Hospital Stay (HOSPITAL_COMMUNITY)

## 2024-07-18 ENCOUNTER — Telehealth (HOSPITAL_COMMUNITY): Payer: Self-pay | Admitting: Pharmacy Technician

## 2024-07-18 DIAGNOSIS — A419 Sepsis, unspecified organism: Secondary | ICD-10-CM | POA: Diagnosis not present

## 2024-07-18 DIAGNOSIS — R6520 Severe sepsis without septic shock: Secondary | ICD-10-CM | POA: Diagnosis not present

## 2024-07-18 LAB — BASIC METABOLIC PANEL WITH GFR
Anion gap: 6 (ref 5–15)
BUN: 34 mg/dL — ABNORMAL HIGH (ref 8–23)
CO2: 27 mmol/L (ref 22–32)
Calcium: 8.4 mg/dL — ABNORMAL LOW (ref 8.9–10.3)
Chloride: 109 mmol/L (ref 98–111)
Creatinine, Ser: 0.94 mg/dL (ref 0.61–1.24)
GFR, Estimated: >60 mL/min (ref >60)
Glucose, Bld: 120 mg/dL — ABNORMAL HIGH (ref 70–99)
Potassium: 3.9 mmol/L (ref 3.5–5.1)
Sodium: 142 mmol/L (ref 135–145)

## 2024-07-18 LAB — GLUCOSE, CAPILLARY
Glucose-Capillary: 86 mg/dL (ref 70–99)
Glucose-Capillary: 128 mg/dL — ABNORMAL HIGH (ref 70–99)
Glucose-Capillary: 148 mg/dL — ABNORMAL HIGH (ref 70–99)
Glucose-Capillary: 174 mg/dL — ABNORMAL HIGH (ref 70–99)

## 2024-07-18 MED ORDER — FOOD THICKENER (SIMPLYTHICK)
30.0000 | Freq: Once | ORAL | Status: AC
  Administered 2024-07-18: 30 via ORAL

## 2024-07-18 NOTE — Progress Notes (Signed)
 [facility]   ROUNDING   NOTE Earl [name] FMW:[id number]  DOB: May 20, 1941  DOA: 03/04/2024  PCP: [name], MD  07/18/2024,2:46 PM  LOS: 136 days    Code Status: DNR limited code     from: Home current Dispo: Unclear    83 year old black male DM TY 2 Chronic A-fib CHADVASC >4 on Eliquis  he HTN HLD DM TY 2 with labile sugars A1c 8.4 in the past MGUS diagnosed in 2004 last seen by Dr. 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 additionally Cardiac amyloid previously on antitransthyretin injection every 3 monthly/baclofen   He had just been discharged from the hospital 3/26-4/10 with generalized weakness and baclofen  was discontinued because of his encephalopathy and other issues including COVID Apparently there was a skin injury with significant pain discomfort which progressed 4 to 5 days before coming back to the ED General Surgery orthopedics and ID all were consulted    Plan  Stage IV sacral decubiti See summarization of events   Stage IV sacral decubitus wound/ulcer with severe sepsis on admission  - Culture data as above. -4/14-CT showed sacral wound extension to the right gluteus muscle without signs of osteomyelitis. -4/15-underwent bedside debridement of sacral decubitus wound by general surgery -4/21-ID consulted and recommended continuing broad-spectrum antibiotics that he completed on 6/13. 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 -6/17- ID recommended continuing holding off antibiotics since he is hemodynamically stable, afebrile without leukocytosis. -6/17-evaluated by plastic surgery.  Sacral wound felt to be clean.  Wound care recommended -7/13-7/13-CT showed similar appearance of osteomyelitis underlying sacral decubitus ulcer.  -7/13-plastic surgery recommended for continued wound care and no surgical  management. -7/14-Ortho evaluated patient for left hip wound.  Debulked and recommended optimizing nutrition -8/5- Surgery recommended hydrotherapy for left hip wound. Surgical debridement if not successful -8/5-wound care and instruction updated  -8/15-left hip wound worse with tunneling. Daughter concerned about this.  X-ray with left lateral hip soft tissue defect.  No acute bony abnormality.  -Overall, difficult case due to patient's nutritional status and limited mobility.  Prealbumin <5. - CT scan of the hip 8/15 with new left hip wound which extends deep, 2.8 cm without evidence of acute osteolysis involving the underlying left greater trochanter.  Ortho was reconsulted on 07/12/2024: Recommended conservative management.  Continue wound care as per wound care consult recommendations. - The pads on top of the heel wound are not sticking well due to dry and flaky skin.  Discussed with the bedside RN to put the foam pads and then wrap the foot and ankle to prevent them from sliding follow wound care consult recommendations.   Very poor surgical candidate per orthopedics Dr. [name] who saw him and recommended packing wound with 4 x 4 gauze soaked in Vashe-has had a wound VAC on since-06/2024  Wound care to revisit Pictures taken as below      Hypoglycemia NOS Discontinued all long-acting coverage-CBGs 80-1 28  Severe sepsis on admission Present on admission and likely secondary to infected area  Aspiration pneumonia on 8/18 Completed 5 days of Rocephin  and Flagyl  on 8/22 SLP to revisit discussed with them in person today and they will ensure that the nectar thickened liquids are available for him  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 /Vutusiran---recent goal-directed therapy for heart issues is limited Currently remains on Lasix  40 p.o. Is -7.8 L  Permanent A-fib continues on amiodarone  200 daily  as well as Eliquis  5 twice  daily  Transaminitis Resolved--Statin held because of transaminitis  Acute superimposed on chronic pain Continues on Tylenol  650 every 6 as needed mild pain Oxy IR 2.5 every 4 as needed severe pain  Anxiety and adjustment disorder consulted psychiatry on 7/725 and is currently on Zoloft  50  CVA 2018 Dysphagia Protein energy malnutrition severe continue supplementation.  Diet SLP occasional visiting and patient currently on dysphagia 3 nectar thick liquid with straw Has been working with mobility regularly     Data Reviewed:   Sodium 142 potassium 3.9 chloride 109 bicarb 27 BUN/creatinine 34/0.9  DVT prophylaxis: Eliquis   Status is: Inpatient Remains inpatient appropriate because:   Await dispo planning     Subjective:   Seen earlier this morning and then seen in the afternoon to look at the wounds he is much more awake in the afternoon he voices that his wound VAC was placed recently he is not in any severe pain He confirms that he wants to treat everything possible Looks like he ate about 75 to 100% of meals   Objective + exam Vitals:   07/17/24 2327 07/18/24 0205 07/18/24 0452 07/18/24 0738  BP: 93/60  (!) 96/58 96/64  Pulse: 67  63 65  Resp: 20   16  Temp: 98.1 F (36.7 C)  97.9 F (36.6 C) (!) 97 F (36.1 C)  TempSrc:   Oral   SpO2: 100%  100% 98%  Weight:  69.4 kg    Height:       Filed Weights   07/15/24 0500 07/16/24 0500 07/18/24 0205  Weight: 68.9 kg 69.4 kg 69.4 kg     Examination: Cachectic black male no distress Bitemporal supraclavicular wasting S1-S2?  Murmur Soft no rebound no guarding Prevalon boots on both lower extremities wound pictures as above dated 8/28    Scheduled Meds:  (feeding supplement) PROSource Plus  30 mL Oral BID BM   amiodarone   200 mg Oral Daily   apixaban   5 mg Oral BID   collagenase    Topical Daily   diclofenac  Sodium  4 g Topical QID   feeding supplement  237 mL Oral BID BM   ferrous sulfate   325 mg Oral  QHS   fluticasone   1 spray Each Nare Daily   furosemide   40 mg Oral Daily   guaiFENesin   600 mg Oral BID   insulin  aspart  0-9 Units Subcutaneous TID WC   levothyroxine   50 mcg Oral Q0600   lidocaine   1 patch Transdermal Q24H   lidocaine -EPINEPHrine  (PF)  30 mL Infiltration Once   liver oil-zinc  oxide   Topical BID   loratadine   10 mg Oral Daily   multivitamin with minerals  1 tablet Oral Daily   neomycin -bacitracin -polymyxin   Apply externally Daily   nutrition supplement (JUVEN)  1 packet Oral BID BM   mouth rinse  15 mL Mouth Rinse 4 times per day   pantoprazole   40 mg Oral Daily   senna-docusate  1 tablet Oral QHS   sertraline   50 mg Oral Daily   sodium chloride  flush  10-40 mL Intracatheter Q12H   sorbitol   30 mL Oral QHS   Tafamidis   1 capsule Oral Daily   vitamin A   10,000 Units Oral Daily   vutrisiran  sodium  25 mg Subcutaneous Q90 days   Continuous Infusions:  dextrose       Time 45  Earl Grady Mohabir, MD  [facility]

## 2024-07-18 NOTE — Telephone Encounter (Signed)
 Advanced Heart Failure Patient Advocate Encounter  Earl [name] reached out request a new rx for Amvuttra . Sent RX request to [facility]) who will send it in.   Earl [name] Pa, CPhT

## 2024-07-18 NOTE — Progress Notes (Signed)
 Mobility Specialist: Progress Note   07/18/24 1400  Mobility  Activity  (HEP)  Range of Motion/Exercises Active Assistive;Right arm;Left arm;Right leg;Left leg  Activity Response Tolerated well  Mobility Referral Yes  Mobility visit 1 Mobility  Mobility Specialist Start Time (ACUTE ONLY) 1145  Mobility Specialist Stop Time (ACUTE ONLY) 1218  Mobility Specialist Time Calculation (min) (ACUTE ONLY) 33 min    Pt received in bed, agreeable to mobility session. Performed UE and LE exercises, tolerated well.  Left in bed with all needs met, call bell in reach.    UE (with theraband): bicep curls, chest press, front raises, crossbody punches , internal  shoulder rotation (10-15x each) LE: bicycles (3 rounds, 15s each), ab/adduction with resistance (3x each leg w/ 3-5s hold)  Earl [name] Mobility Specialist Please contact via SecureChat or Rehab office at [phone]

## 2024-07-18 NOTE — Evaluation (Signed)
 Modified Barium Swallow Study  Patient Details  Name: Earl [name] MRN: [id number] Date of Birth: 05-21-1941  Today's Date: 07/18/2024  Modified Barium Swallow completed.  Full report located under Chart Review in the Imaging Section.  History of Present Illness Earl [name] is an 83 y.o. 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 He was discharged from the hospital (admitted for acute metabolic encephalopathy, hypotensive, hypoglycemic) to [facility] for rehab. He presented back to the hospital four days later on 03/04/24 with SOB and pain in sacral area. 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 SLP previously saw pt to assess and manage concerns for dysphagia. He was D/Cd on a pureed diet with thin liquids. SLP re-consulted on 05/02/24 for voice assessment; signed off on 8/4 with recs for OP ENT if warranted. New swallowing consult ordered 8/18 due to new onset coughing after eating. MBS 8/19 - intermittent trace aspiration thin liquids; rec thicken to nectar to allow time to recover from acute pna.   Clinical Impression  Pt presents with a persisting mild pharyngeal dysphagia- today's study was more challenging due to positioning restrictions related to discomfort. Swallow was characterized by mistiming of the pharyngeal swallow, with thin liquids intermittently spilling into the larynx just before airway closure (there were fewer episodes of aspiration today). Quantity was trace, inconsistent, and usually occurred with large boluses- pt has hx of both silent and sensed aspiration. Smaller, more controlled sips of thins were not aspirated. Chin tuck, straws had no impact on swallow physiology. There was mild pyriform residue post-swallow. Oral  phase was slow but functional.    Spoke with his daughter, Earl [name], re: results - she verbalized understanding of persisting risk of trace aspiration.    Agreed to: 1) advance liquids to thin, continue dysphagia 3 diet; 2) on days when pt may be coughing with liquids, thicken to nectar PRN. A supply of nectar thickener can be maintained in the room; 3) meds may be given whole in puree; 4) pt should be upright for all PO intake.    Pt's nurse and Dr. Royal were updated re: the above.  SLP will follow.  Factors that may increase risk of adverse event in presence of aspiration Earl [name] & Earl [name] 2021): Poor general health and/or compromised immunity;Limited mobility;Frail or deconditioned;Weak cough  Swallow Evaluation Recommendations Recommendations: PO diet PO Diet Recommendation: Dysphagia 3 (Mechanical soft);Thin liquids (Level 0) Liquid Administration via: Cup;Straw Medication Administration: Whole meds with puree Supervision: Staff to assist with self-feeding Swallowing strategies  : Small bites/sips Postural changes: Position pt fully upright for meals Oral care recommendations: Oral care BID (2x/day)     Earl [name] L. Vona, MA CCC/SLP Clinical Specialist - Acute Care SLP Acute Rehabilitation Services Office number [phone]  Earl [name] 07/18/2024,3:07 PM

## 2024-07-18 NOTE — Plan of Care (Signed)
  Problem: Fluid Volume: Goal: Hemodynamic stability will improve Outcome: Progressing   Problem: Clinical Measurements: Goal: Diagnostic test results will improve Outcome: Progressing Goal: Signs and symptoms of infection will decrease Outcome: Progressing   Problem: Education: Goal: Knowledge of risk factors and measures for prevention of condition will improve Outcome: Progressing   Problem: Coping: Goal: Psychosocial and spiritual needs will be supported Outcome: Progressing   Problem: Respiratory: Goal: Will maintain a patent airway Outcome: Progressing Goal: Complications related to the disease process, condition or treatment will be avoided or minimized Outcome: Progressing   Problem: Education: Goal: Ability to describe self-care measures that may prevent or decrease complications (Diabetes Survival Skills Education) will improve Outcome: Progressing   Problem: Coping: Goal: Ability to adjust to condition or change in health will improve Outcome: Progressing   Problem: Fluid Volume: Goal: Ability to maintain a balanced intake and output will improve Outcome: Progressing   Problem: Health Behavior/Discharge Planning: Goal: Ability to manage health-related needs will improve Outcome: Progressing   Problem: Metabolic: Goal: Ability to maintain appropriate glucose levels will improve Outcome: Progressing   Problem: Nutritional: Goal: Maintenance of adequate nutrition will improve Outcome: Progressing Goal: Progress toward achieving an optimal weight will improve Outcome: Progressing   Problem: Skin Integrity: Goal: Risk for impaired skin integrity will decrease Outcome: Progressing   Problem: Tissue Perfusion: Goal: Adequacy of tissue perfusion will improve Outcome: Progressing   Problem: Education: Goal: Knowledge of General Education information will improve Description: Including pain rating scale, medication(s)/side effects and non-pharmacologic  comfort measures Outcome: Progressing   Problem: Health Behavior/Discharge Planning: Goal: Ability to manage health-related needs will improve Outcome: Progressing   Problem: Clinical Measurements: Goal: Ability to maintain clinical measurements within normal limits will improve Outcome: Progressing Goal: Will remain free from infection Outcome: Progressing Goal: Diagnostic test results will improve Outcome: Progressing Goal: Respiratory complications will improve Outcome: Progressing Goal: Cardiovascular complication will be avoided Outcome: Progressing   Problem: Activity: Goal: Risk for activity intolerance will decrease Outcome: Progressing   Problem: Nutrition: Goal: Adequate nutrition will be maintained Outcome: Progressing   Problem: Coping: Goal: Level of anxiety will decrease Outcome: Progressing   Problem: Elimination: Goal: Will not experience complications related to bowel motility Outcome: Progressing Goal: Will not experience complications related to urinary retention Outcome: Progressing   Problem: Pain Managment: Goal: General experience of comfort will improve and/or be controlled Outcome: Progressing   Problem: Safety: Goal: Ability to remain free from injury will improve Outcome: Progressing   Problem: Skin Integrity: Goal: Risk for impaired skin integrity will decrease Outcome: Progressing

## 2024-07-18 NOTE — Plan of Care (Signed)
   Medical records reviewed including progress notes, labs, imaging. Stable overall with VAC on hip wound.  Goals of care remain clear for continued medical interventions and hospital is the preferred setting for this care. Hope is for transition to home with outpatient palliative care. Patient is satisfied with current symptom management plan.   PMT will continue to follow peripherally. No other acute needs at this time.   Earl Oetken, PA-C Palliative Medicine Team  Team Phone # [phone]   NO CHARGE

## 2024-07-19 ENCOUNTER — Other Ambulatory Visit: Payer: Self-pay

## 2024-07-19 DIAGNOSIS — A419 Sepsis, unspecified organism: Secondary | ICD-10-CM | POA: Diagnosis not present

## 2024-07-19 DIAGNOSIS — R6520 Severe sepsis without septic shock: Secondary | ICD-10-CM | POA: Diagnosis not present

## 2024-07-19 LAB — GLUCOSE, CAPILLARY
Glucose-Capillary: 77 mg/dL (ref 70–99)
Glucose-Capillary: 164 mg/dL — ABNORMAL HIGH (ref 70–99)
Glucose-Capillary: 171 mg/dL — ABNORMAL HIGH (ref 70–99)
Glucose-Capillary: 174 mg/dL — ABNORMAL HIGH (ref 70–99)

## 2024-07-19 MED ORDER — OXYCODONE HCL 5 MG PO TABS
2.5000 mg | ORAL_TABLET | Freq: Every day | ORAL | Status: DC | PRN
  Administered 2024-07-20 – 2024-07-26 (×6): 2.5 mg via ORAL
  Filled 2024-07-19 (×6): qty 1

## 2024-07-19 NOTE — Plan of Care (Signed)
  Problem: Fluid Volume: Goal: Hemodynamic stability will improve Outcome: Progressing   Problem: Clinical Measurements: Goal: Signs and symptoms of infection will decrease Outcome: Progressing   Problem: Education: Goal: Knowledge of risk factors and measures for prevention of condition will improve Outcome: Progressing   Problem: Coping: Goal: Psychosocial and spiritual needs will be supported Outcome: Progressing   Problem: Clinical Measurements: Goal: Respiratory complications will improve Outcome: Progressing   Problem: Pain Managment: Goal: General experience of comfort will improve and/or be controlled Outcome: Progressing   Problem: Safety: Goal: Ability to remain free from injury will improve Outcome: Progressing

## 2024-07-19 NOTE — Progress Notes (Signed)
 [facility]   ROUNDING   NOTE Earl [name] FMW:[id number]  DOB: 1941/07/16  DOA: 03/04/2024  PCP: [name], MD  07/19/2024,3:10 PM  LOS: 137 days    Code Status: DNR limited code     from: Home current Dispo: Unclear    83 year old black male DM TY 2 Chronic A-fib CHADVASC >4 on Eliquis  he HTN HLD DM TY 2 with labile sugars A1c 8.4 in the past MGUS diagnosed in 2004 last seen by Dr. 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 additionally Cardiac amyloid previously on antitransthyretin injection every 3 monthly/baclofen   He had just been discharged from the hospital 3/26-4/10 with generalized weakness and baclofen  was discontinued because of his encephalopathy and other issues including COVID Apparently there was a skin injury with significant pain discomfort which progressed 4 to 5 days before coming back to the ED General Surgery orthopedics and ID all were consulted  Plan  Stage IV sacral decubiti See summarization of events   Stage IV sacral decubitus wound/ulcer with severe sepsis on admission  - Culture data as above. -4/14-CT showed sacral wound extension to the right gluteus muscle without signs of osteomyelitis. -4/15-underwent bedside debridement of sacral decubitus wound by general surgery -4/21-ID consulted and recommended continuing broad-spectrum antibiotics that he completed on 6/13. 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 -6/17- ID recommended continuing holding off antibiotics since he is hemodynamically stable, afebrile without leukocytosis. -6/17-evaluated by plastic surgery.  Sacral wound felt to be clean.  Wound care recommended -7/13-7/13-CT showed similar appearance of osteomyelitis underlying sacral decubitus ulcer.  -7/13-plastic surgery recommended for continued wound care and no surgical  management. -7/14-Ortho evaluated patient for left hip wound.  Debulked and recommended optimizing nutrition -8/5- Surgery recommended hydrotherapy for left hip wound. Surgical debridement if not successful -8/5-wound care and instruction updated  -8/15-left hip wound worse with tunneling. Daughter concerned about this.  X-ray with left lateral hip soft tissue defect.  No acute bony abnormality.  -Overall, difficult case due to patient's nutritional status and limited mobility.  Prealbumin <5. - CT scan of the hip 8/15 with new left hip wound which extends deep, 2.8 cm without evidence of acute osteolysis involving the underlying left greater trochanter.  Ortho was reconsulted on 07/12/2024: Recommended conservative management.  Continue wound care as per wound care consult recommendations. - Current wound care instructions as below  1- Left shoulder Unstageable Cleanse with NS, pat dry, Apply 1/4 thick layer of Santyl  to wound bed, top with saline moist gauze. Top with silicone foam. Change daily.  2- Left trochanter: NPWT with weekly peel and place dressing.  3 - Sacral Stage 4 Pressure Injury Cleanse with Vashe#[id number]. Apply Aquacel Ag+ (silver hydrofiber) Lawson # [id number] in the wound bed and fill the undermining using a swab, cover with sacral foam dressing, change daily and PRN soilage  4- Right heel; Unstageable; Cleanse with NS, pat dry. Apply Aquacel Ag+ (silver hydrofiber) Lawson # [id number] and cover with silicone foam dressing. Change daily. Off load left heel with Prevalon boots at all times. LALM in place for moisture management and pressure redistribution.   Very poor surgical candidate per orthopedics Dr. [name] who saw him and recommended packing wound with 4 x 4 gauze soaked in Vashe-has had a wound VAC on since-07/16/2024  Wound care revisiting frequently and will change VAC on Tuesdays Wounds on lower extremity as below from 8/29  Hypoglycemia NOS Discontinued all long-acting  coverage-CBGs ranging 72-1 64 eating only 20% of meals  Severe sepsis on admission Present on admission and likely secondary to infected area[s] continue wound care  Aspiration pneumonia on 8/18 Completed 5 days of Rocephin  and Flagyl  on 8/22 SLP to revisit discussed with them in person today and they will ensure that the nectar thickened liquids are available for him  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 /Vutusiran---recent goal-directed therapy for heart issues is limited Currently remains on Lasix  40 p.o.--- might adjust dose going forward Is 2.6 neg L  Permanent A-fib continues on amiodarone  200 daily as well as Eliquis  5 twice daily  Intermittent azotemia Monitor trends with electrolytes every so often  Transaminitis Resolved--Statin held because of transaminitis  Acute superimposed on chronic pain Continues on Tylenol  650 every 6 as needed mild pain Oxy IR 2.5 daily as needed severe pain-would avoid giving opiate pain medication unless is in severe pain  Anxiety and adjustment disorder consulted psychiatry on 7/725 and is currently on Zoloft  50  CVA 2018 Dysphagia Protein energy malnutrition severe continue supplementation.  Diet SLP occasional visiting and patient currently on dysphagia 3 nectar thick liquid with straw Has been working with mobility some   Discussed with daughter on phone 8/29  Data Reviewed:    DVT prophylaxis: Eliquis   Status is: Inpatient Remains inpatient appropriate because:   Await dispo planning     Subjective:   Apparently did not really eat much to nursing He seems okay with a little bit sleepy but in no distress He has no nausea or vomiting  Objective + exam Vitals:   07/19/24 0359 07/19/24 0500 07/19/24 0900 07/19/24 0931  BP: (!) 91/59  100/64 100/64  Pulse: 60  66 65  Resp: 20  18 17   Temp: 98 F (36.7 C)  97.6 F (36.4 C) 97.7 F (36.5 C)  TempSrc: Oral  Oral Oral  SpO2:  100%  100% 100%  Weight:  69.4 kg    Height:       Filed Weights   07/16/24 0500 07/18/24 0205 07/19/24 0500  Weight: 69.4 kg 69.4 kg 69.4 kg     Examination: Cachectic black male no distress Bitemporal supraclavicular wasting S1-S2?  Murmur Chest is clear no wheeze Soft no rebound no guarding Wound pictures as noted above   Scheduled Meds:  (feeding supplement) PROSource Plus  30 mL Oral BID BM   amiodarone   200 mg Oral Daily   apixaban   5 mg Oral BID   collagenase    Topical Daily   diclofenac  Sodium  4 g Topical QID   feeding supplement  237 mL Oral BID BM   ferrous sulfate   325 mg Oral QHS   fluticasone   1 spray Each Nare Daily   furosemide   40 mg Oral Daily   guaiFENesin   600 mg Oral BID   insulin  aspart  0-9 Units Subcutaneous TID WC   levothyroxine   50 mcg Oral Q0600   lidocaine   1 patch Transdermal Q24H   lidocaine -EPINEPHrine  (PF)  30 mL Infiltration Once   liver oil-zinc  oxide   Topical BID   loratadine   10 mg Oral Daily   multivitamin with minerals  1 tablet Oral Daily   neomycin -bacitracin -polymyxin   Apply externally Daily   nutrition supplement (JUVEN)  1 packet Oral BID BM   mouth rinse  15 mL Mouth Rinse 4 times per day   pantoprazole   40 mg Oral Daily   senna-docusate  1 tablet  Oral QHS   sertraline   50 mg Oral Daily   sodium chloride  flush  10-40 mL Intracatheter Q12H   sorbitol   30 mL Oral QHS   Tafamidis   1 capsule Oral Daily   vitamin A   10,000 Units Oral Daily   vutrisiran  sodium  25 mg Subcutaneous Q90 days   Continuous Infusions:  dextrose       Time 45  Jai-Gurmukh Harry Shuck, MD  [facility]

## 2024-07-19 NOTE — Plan of Care (Signed)
   Problem: Fluid Volume: Goal: Hemodynamic stability will improve Outcome: Progressing

## 2024-07-19 NOTE — TOC Progression Note (Signed)
 Transition of Care [facility]) - Progression Note    Patient Details  Name: Earl [name] MRN: [id number] Date of Birth: 25-Jan-1941  Transition of [facility]) CM/SW Contact  Rosaline JONELLE Joe, RN Phone Number: 07/19/2024, 3:54 PM  Clinical Narrative:    CM spoke with Josefa Jes, LCSW and Christyne Sprang, LCSW Toc Supervisor concerning patient's barriers for disposition plan.  Hospital Leadership will continue to follow the patient for discussions regarding disposition needs.   Expected Discharge Plan: Skilled Nursing Facility Barriers to Discharge: Continued Medical Work up               Expected Discharge Plan and Services In-house Referral: Clinical Social Work     Living arrangements for the past 2 months:  (from [facility] short term rehab)                                       Social Drivers of Health (SDOH) Interventions SDOH Screenings   Food Insecurity: No Food Insecurity (03/05/2024)  Housing: Low Risk  (03/05/2024)  Transportation Needs: No Transportation Needs (03/05/2024)  Utilities: Not At Risk (03/05/2024)  Depression (PHQ2-9): Low Risk  (10/03/2023)  Financial Resource Strain: Patient Declined (01/16/2024)  Physical Activity: Unknown (01/16/2024)  Social Connections: Moderately Integrated (03/05/2024)  Stress: Stress Concern Present (01/16/2024)  Tobacco Use: Low Risk  (07/04/2024)    Readmission Risk Interventions    03/19/2024   10:24 AM  Readmission Risk Prevention Plan  Transportation Screening Complete  Medication Review (RN Care Manager) Complete  PCP or Specialist appointment within 3-5 days of discharge Complete  HRI or Home Care Consult Complete  SW Recovery Care/Counseling Consult Complete  Palliative Care Screening Complete  Skilled Nursing Facility Complete

## 2024-07-19 NOTE — Consult Note (Signed)
 WOC Nurse wound follow up   WOC team received secure notification that patient's NPWT machine is displaying a leaking alarm.   Presented to bedside, noted track pad has come loose from dressing, NPWT dressing remains in place, intact.  New track pad placed, NPWT restarted, sealed at 125 mmHG.    WOC Nurse team will follow with you and see patient within 10 days for wound assessments.  Please notify WOC nurses of any acute changes in the wounds or any new areas of concern  Thank you,  Doyal Polite, RN, MSN, [facility] WOC Team [phone] (Available Mon-Fri 0700-1500)

## 2024-07-20 DIAGNOSIS — R6520 Severe sepsis without septic shock: Secondary | ICD-10-CM | POA: Diagnosis not present

## 2024-07-20 DIAGNOSIS — A419 Sepsis, unspecified organism: Secondary | ICD-10-CM | POA: Diagnosis not present

## 2024-07-20 LAB — GLUCOSE, CAPILLARY
Glucose-Capillary: 89 mg/dL (ref 70–99)
Glucose-Capillary: 139 mg/dL — ABNORMAL HIGH (ref 70–99)
Glucose-Capillary: 146 mg/dL — ABNORMAL HIGH (ref 70–99)
Glucose-Capillary: 116 mg/dL — ABNORMAL HIGH (ref 70–99)

## 2024-07-20 MED ORDER — FUROSEMIDE 20 MG PO TABS
20.0000 mg | ORAL_TABLET | Freq: Every day | ORAL | Status: DC
  Administered 2024-07-21 – 2024-08-16 (×26): 20 mg via ORAL
  Filled 2024-07-20 (×26): qty 1

## 2024-07-20 NOTE — Plan of Care (Signed)
  Problem: Fluid Volume: Goal: Hemodynamic stability will improve Outcome: Progressing   Problem: Clinical Measurements: Goal: Diagnostic test results will improve Outcome: Progressing Goal: Signs and symptoms of infection will decrease Outcome: Progressing   Problem: Education: Goal: Knowledge of risk factors and measures for prevention of condition will improve Outcome: Progressing   Problem: Coping: Goal: Psychosocial and spiritual needs will be supported Outcome: Progressing   Problem: Respiratory: Goal: Will maintain a patent airway Outcome: Progressing Goal: Complications related to the disease process, condition or treatment will be avoided or minimized Outcome: Progressing   Problem: Education: Goal: Ability to describe self-care measures that may prevent or decrease complications (Diabetes Survival Skills Education) will improve Outcome: Progressing   Problem: Coping: Goal: Ability to adjust to condition or change in health will improve Outcome: Progressing   Problem: Fluid Volume: Goal: Ability to maintain a balanced intake and output will improve Outcome: Progressing   Problem: Health Behavior/Discharge Planning: Goal: Ability to manage health-related needs will improve Outcome: Progressing   Problem: Metabolic: Goal: Ability to maintain appropriate glucose levels will improve Outcome: Progressing   Problem: Nutritional: Goal: Maintenance of adequate nutrition will improve Outcome: Progressing Goal: Progress toward achieving an optimal weight will improve Outcome: Progressing   Problem: Skin Integrity: Goal: Risk for impaired skin integrity will decrease Outcome: Progressing   Problem: Tissue Perfusion: Goal: Adequacy of tissue perfusion will improve Outcome: Progressing   Problem: Education: Goal: Knowledge of General Education information will improve Description: Including pain rating scale, medication(s)/side effects and non-pharmacologic  comfort measures Outcome: Progressing   Problem: Health Behavior/Discharge Planning: Goal: Ability to manage health-related needs will improve Outcome: Progressing   Problem: Clinical Measurements: Goal: Ability to maintain clinical measurements within normal limits will improve Outcome: Progressing Goal: Will remain free from infection Outcome: Progressing Goal: Diagnostic test results will improve Outcome: Progressing Goal: Respiratory complications will improve Outcome: Progressing Goal: Cardiovascular complication will be avoided Outcome: Progressing   Problem: Activity: Goal: Risk for activity intolerance will decrease Outcome: Progressing   Problem: Nutrition: Goal: Adequate nutrition will be maintained Outcome: Progressing   Problem: Coping: Goal: Level of anxiety will decrease Outcome: Progressing   Problem: Elimination: Goal: Will not experience complications related to bowel motility Outcome: Progressing Goal: Will not experience complications related to urinary retention Outcome: Progressing   Problem: Pain Managment: Goal: General experience of comfort will improve and/or be controlled Outcome: Progressing   Problem: Safety: Goal: Ability to remain free from injury will improve Outcome: Progressing   Problem: Skin Integrity: Goal: Risk for impaired skin integrity will decrease Outcome: Progressing

## 2024-07-20 NOTE — Progress Notes (Addendum)
 [facility]   ROUNDING   NOTE Earl [name] FMW:[id number]  DOB: 01-01-1941  DOA: 03/04/2024  PCP: [name], MD  07/20/2024,3:59 PM  LOS: 138 days    Code Status: DNR limited code     from: Home current Dispo: Unclear    83 year old black male DM TY 2 Chronic A-fib CHADVASC >4 on Eliquis  he HTN HLD DM TY 2 with labile sugars A1c 8.4 in the past MGUS diagnosed in 2004 last seen by Dr. 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 additionally Cardiac amyloid previously on antitransthyretin injection every 3 monthly/baclofen   He had just been discharged from the hospital 3/26-4/10 with generalized weakness and baclofen  was discontinued because of his encephalopathy and other issues including COVID Apparently there was a skin injury with significant pain discomfort which progressed 4 to 5 days before coming back to the ED General Surgery orthopedics and ID all were consulted  Plan  Stage IV sacral decubiti See summarization of events   Stage IV sacral decubitus wound/ulcer with severe sepsis on admission  - Culture data as above. -4/14-CT showed sacral wound extension to the right gluteus muscle without signs of osteomyelitis. -4/15-underwent bedside debridement of sacral decubitus wound by general surgery -4/21-ID consulted and recommended continuing broad-spectrum antibiotics that he completed on 6/13. 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 -6/17- ID recommended continuing holding off antibiotics since he is hemodynamically stable, afebrile without leukocytosis. -6/17-evaluated by plastic surgery.  Sacral wound felt to be clean.  Wound care recommended -7/13-7/13-CT showed similar appearance of osteomyelitis underlying sacral decubitus ulcer.  -7/13-plastic surgery recommended for continued wound care and no surgical  management. -7/14-Ortho evaluated patient for left hip wound.  Debulked and recommended optimizing nutrition -8/5- Surgery recommended hydrotherapy for left hip wound. Surgical debridement if not successful -8/5-wound care and instruction updated  -8/15-left hip wound worse with tunneling. Daughter concerned about this.  X-ray with left lateral hip soft tissue defect.  No acute bony abnormality.  -Overall, difficult case due to patient's nutritional status and limited mobility.  Prealbumin <5. - CT scan of the hip 8/15 with new left hip wound which extends deep, 2.8 cm without evidence of acute osteolysis involving the underlying left greater trochanter.  Ortho was reconsulted on 07/12/2024: Recommended conservative management.  Continue wound care as per wound care consult recommendations. - Current wound care instructions as below  1- Left shoulder Unstageable Cleanse with NS, pat dry, Apply 1/4 thick layer of Santyl  to wound bed, top with saline moist gauze. Top with silicone foam. Change daily.  2- Left trochanter: NPWT with weekly peel and place dressing.  3 - Sacral Stage 4 Pressure Injury Cleanse with Vashe#[id number]. Apply Aquacel Ag+ (silver hydrofiber) Lawson # [id number] in the wound bed and fill the undermining using a swab, cover with sacral foam dressing, change daily and PRN soilage  4- Right heel; Unstageable; Cleanse with NS, pat dry. Apply Aquacel Ag+ (silver hydrofiber) Lawson # [id number] and cover with silicone foam dressing. Change daily. Off load left heel with Prevalon boots at all times. LALM in place for moisture management and pressure redistribution.   Very poor surgical candidate per orthopedics Dr. 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 /Vutusiran---recent goal-directed therapy for heart issues is limited Cut back Lasix  to 20 daily Is 2.8 neg L Labs tomorrow  Permanent A-fib continues on amiodarone  200 daily as well as Eliquis  5 twice daily  Intermittent azotemia Monitor trends with electrolytes every so often  Transaminitis Resolved--Statin held because of transaminitis  Acute superimposed on chronic pain Continues on Tylenol  650 every 6 as needed mild pain Oxy IR 2.5 daily as needed severe pain---   Anxiety and adjustment disorder consulted psychiatry on 7/725 and is currently on Zoloft  50  CVA 2018 Dysphagia Protein energy malnutrition severe continue supplementation. Has been working with mobility some   Discussed with daughter on phone 8/30  Data Reviewed:    DVT prophylaxis: Eliquis   Status is: Inpatient Remains inpatient appropriate because:   Await dispo planning     Subjective:   Appetite still somewhat poor No new complaints--- blood pressure is slightly low  Objective + exam Vitals:   07/20/24 0039 07/20/24 0517 07/20/24 0753 07/20/24 1221  BP: 99/64 95/64 (!) 99/49 113/61  Pulse: 64 61 (!) 59 62  Resp: 17 15 18    Temp: 98.1 F (36.7 C) 97.7 F (36.5 C) 98.5 F (36.9 C) 98.6 F (37 C)  TempSrc: Oral Oral    SpO2: 98% 98% 100%   Weight:      Height:       Filed Weights   07/16/24 0500 07/18/24 0205 07/19/24 0500  Weight: 69.4 kg 69.4 kg 69.4 kg     Examination:  Cachectic no distress  Scheduled Meds:  (feeding supplement) PROSource Plus  30 mL Oral BID BM   amiodarone   200 mg Oral Daily    apixaban   5 mg Oral BID   collagenase    Topical Daily   diclofenac  Sodium  4 g Topical QID   feeding supplement  237 mL Oral BID BM   ferrous sulfate   325 mg Oral QHS   fluticasone   1 spray Each Nare Daily   [START ON 07/21/2024] furosemide   20 mg Oral Daily   guaiFENesin   600 mg Oral BID   insulin  aspart  0-9 Units Subcutaneous TID WC   levothyroxine   50 mcg Oral Q0600   lidocaine   1 patch Transdermal Q24H   lidocaine -EPINEPHrine  (PF)  30 mL Infiltration Once   liver oil-zinc  oxide   Topical BID   loratadine   10 mg Oral Daily   multivitamin with minerals  1 tablet Oral Daily   neomycin -bacitracin -polymyxin   Apply externally Daily   nutrition supplement (JUVEN)  1 packet Oral BID BM   mouth rinse  15 mL Mouth Rinse 4 times per day   pantoprazole   40 mg Oral Daily   senna-docusate  1 tablet Oral QHS   sertraline   50 mg Oral Daily   sodium chloride  flush  10-40 mL Intracatheter Q12H   sorbitol   30 mL Oral QHS   Tafamidis   1 capsule Oral Daily   vitamin A   10,000 Units Oral Daily   vutrisiran  sodium  25 mg Subcutaneous Q90 days   Continuous Infusions:  dextrose       Time 45  Jai-Gurmukh Sallie Staron,  MD  [facility]

## 2024-07-20 NOTE — Plan of Care (Signed)
  Problem: Fluid Volume: Goal: Hemodynamic stability will improve Outcome: Progressing   Problem: Clinical Measurements: Goal: Signs and symptoms of infection will decrease Outcome: Progressing   Problem: Education: Goal: Knowledge of risk factors and measures for prevention of condition will improve Outcome: Progressing   Problem: Coping: Goal: Psychosocial and spiritual needs will be supported Outcome: Progressing   Problem: Tissue Perfusion: Goal: Adequacy of tissue perfusion will improve Outcome: Progressing   Problem: Education: Goal: Knowledge of General Education information will improve Description: Including pain rating scale, medication(s)/side effects and non-pharmacologic comfort measures Outcome: Progressing   Problem: Health Behavior/Discharge Planning: Goal: Ability to manage health-related needs will improve Outcome: Progressing   Problem: Safety: Goal: Ability to remain free from injury will improve Outcome: Progressing   Problem: Skin Integrity: Goal: Risk for impaired skin integrity will decrease Outcome: Progressing

## 2024-07-21 DIAGNOSIS — R6520 Severe sepsis without septic shock: Secondary | ICD-10-CM | POA: Diagnosis not present

## 2024-07-21 DIAGNOSIS — A419 Sepsis, unspecified organism: Secondary | ICD-10-CM | POA: Diagnosis not present

## 2024-07-21 LAB — GLUCOSE, CAPILLARY
Glucose-Capillary: 99 mg/dL (ref 70–99)
Glucose-Capillary: 111 mg/dL — ABNORMAL HIGH (ref 70–99)
Glucose-Capillary: 127 mg/dL — ABNORMAL HIGH (ref 70–99)
Glucose-Capillary: 113 mg/dL — ABNORMAL HIGH (ref 70–99)

## 2024-07-21 NOTE — Plan of Care (Signed)
  Problem: Fluid Volume: Goal: Hemodynamic stability will improve Outcome: Progressing   Problem: Clinical Measurements: Goal: Diagnostic test results will improve Outcome: Progressing Goal: Signs and symptoms of infection will decrease Outcome: Progressing   Problem: Education: Goal: Knowledge of risk factors and measures for prevention of condition will improve Outcome: Progressing   Problem: Coping: Goal: Psychosocial and spiritual needs will be supported Outcome: Progressing   Problem: Respiratory: Goal: Will maintain a patent airway Outcome: Progressing Goal: Complications related to the disease process, condition or treatment will be avoided or minimized Outcome: Progressing   Problem: Education: Goal: Ability to describe self-care measures that may prevent or decrease complications (Diabetes Survival Skills Education) will improve Outcome: Progressing   Problem: Coping: Goal: Ability to adjust to condition or change in health will improve Outcome: Progressing   Problem: Fluid Volume: Goal: Ability to maintain a balanced intake and output will improve Outcome: Progressing   Problem: Health Behavior/Discharge Planning: Goal: Ability to manage health-related needs will improve Outcome: Progressing   Problem: Metabolic: Goal: Ability to maintain appropriate glucose levels will improve Outcome: Progressing   Problem: Nutritional: Goal: Maintenance of adequate nutrition will improve Outcome: Progressing Goal: Progress toward achieving an optimal weight will improve Outcome: Progressing   Problem: Skin Integrity: Goal: Risk for impaired skin integrity will decrease Outcome: Progressing   Problem: Tissue Perfusion: Goal: Adequacy of tissue perfusion will improve Outcome: Progressing   Problem: Education: Goal: Knowledge of General Education information will improve Description: Including pain rating scale, medication(s)/side effects and non-pharmacologic  comfort measures Outcome: Progressing   Problem: Health Behavior/Discharge Planning: Goal: Ability to manage health-related needs will improve Outcome: Progressing   Problem: Clinical Measurements: Goal: Ability to maintain clinical measurements within normal limits will improve Outcome: Progressing Goal: Will remain free from infection Outcome: Progressing Goal: Diagnostic test results will improve Outcome: Progressing Goal: Respiratory complications will improve Outcome: Progressing Goal: Cardiovascular complication will be avoided Outcome: Progressing   Problem: Activity: Goal: Risk for activity intolerance will decrease Outcome: Progressing   Problem: Nutrition: Goal: Adequate nutrition will be maintained Outcome: Progressing   Problem: Coping: Goal: Level of anxiety will decrease Outcome: Progressing   Problem: Elimination: Goal: Will not experience complications related to bowel motility Outcome: Progressing Goal: Will not experience complications related to urinary retention Outcome: Progressing   Problem: Pain Managment: Goal: General experience of comfort will improve and/or be controlled Outcome: Progressing   Problem: Safety: Goal: Ability to remain free from injury will improve Outcome: Progressing   Problem: Skin Integrity: Goal: Risk for impaired skin integrity will decrease Outcome: Progressing

## 2024-07-21 NOTE — Progress Notes (Signed)
 [facility]   ROUNDING   NOTE Earl [name] FMW:[id number]  DOB: Apr 01, 1941  DOA: 03/04/2024  PCP: [name], MD  07/21/2024,3:58 PM  LOS: 139 days    Code Status: DNR limited code     from: Home current Dispo: Unclear    83 year old black male DM TY 2 Chronic A-fib CHADVASC >4 on Eliquis  he HTN HLD DM TY 2 with labile sugars A1c 8.4 in the past MGUS diagnosed in 2004 last seen by Dr. 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 additionally Cardiac amyloid previously on antitransthyretin injection every 3 monthly/baclofen   He had just been discharged from the hospital 3/26-4/10 with generalized weakness and baclofen  was discontinued because of his encephalopathy and other issues including COVID Apparently there was a skin injury with significant pain discomfort which progressed 4 to 5 days before coming back to the ED General Surgery orthopedics and ID all were consulted  Plan  Stage IV sacral decubiti See summarization of events   Stage IV sacral decubitus wound/ulcer with severe sepsis on admission  - Culture data as above. -4/14-CT showed sacral wound extension to the right gluteus muscle without signs of osteomyelitis. -4/15-underwent bedside debridement of sacral decubitus wound by general surgery -4/21-ID consulted and recommended continuing broad-spectrum antibiotics that he completed on 6/13. 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 -6/17- ID recommended continuing holding off antibiotics since he is hemodynamically stable, afebrile without leukocytosis. -6/17-evaluated by plastic surgery.  Sacral wound felt to be clean.  Wound care recommended -7/13-7/13-CT showed similar appearance of osteomyelitis underlying sacral decubitus ulcer.  -7/13-plastic surgery recommended for continued wound care and no surgical  management. -7/14-Ortho evaluated patient for left hip wound.  Debulked and recommended optimizing nutrition -8/5- Surgery recommended hydrotherapy for left hip wound. Surgical debridement if not successful -8/5-wound care and instruction updated  -8/15-left hip wound worse with tunneling. Daughter concerned about this.  X-ray with left lateral hip soft tissue defect.  No acute bony abnormality.  -Overall, difficult case due to patient's nutritional status and limited mobility.  Prealbumin <5. - CT scan of the hip 8/15 with new left hip wound which extends deep, 2.8 cm without evidence of acute osteolysis involving the underlying left greater trochanter.  Ortho was reconsulted on 07/12/2024: Recommended conservative management.  Continue wound care as per wound care consult recommendations.  1- Left shoulder Unstageable Cleanse with NS, pat dry, Apply 1/4 thick layer of Santyl  to wound bed, top with saline moist gauze. Top with silicone foam. Change daily.  2- Left trochanter: NPWT with weekly peel and place dressing.  3 - Sacral Stage 4 Pressure Injury Cleanse with Vashe#[id number]. Apply Aquacel Ag+ (silver hydrofiber) Lawson # [id number] in the wound bed and fill the undermining using a swab, cover with sacral foam dressing, change daily and PRN soilage  4- Right heel; Unstageable; Cleanse with NS, pat dry. Apply Aquacel Ag+ (silver hydrofiber) Lawson # [id number] and cover with silicone foam dressing. Change daily. Off load left heel with Prevalon boots at all times. LALM in place for moisture management and pressure redistribution.   Very poor surgical candidate per orthopedics Dr. [name] who saw him and recommended packing wound with 4 x 4 gauze soaked in Vashe-has had a wound VAC on since-07/16/2024  All wounds will be reviewed in the next several days on rounds No big changes today  Hypoglycemia NOS Discontinued all long-acting coverage-CBGs 99-1 11-eating a little  better it sounds like  Severe sepsis on  admission Present on admission and likely secondary to infected area[s] continue wound care  Aspiration pneumonia on 8/18 Completed 5 days of Rocephin  and Flagyl  on 8/22 SLP input greatly appreciated--has been transitioned to dysphagia 3, thin liquid diet--meds whole in pure and ensure that he has adequate water etc. at bedside  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 /Vutusiran---recent goal-directed therapy for heart issues is limited Now on  Lasix  to 20 daily Is -4.6 neg L Will probably give some IV albumin --has upper leg anasarca Labs tomorrow  Permanent A-fib continues amiodarone  200 daily as well as Eliquis  5 twice daily  Intermittent azotemia Get labs tomorrow  Transaminitis Resolved--Statin held because of transaminitis Could be effect of amiodarone  but no real other options given marginal blood pressures  Acute superimposed on chronic pain Continues on Tylenol  650 every 6 as needed mild pain Oxy IR 2.5 daily as needed severe pain  Anxiety and adjustment disorder consulted psychiatry on 05/27/24 and is currently on Zoloft  50  CVA 2018 Dysphagia Protein energy malnutrition severe continue supplementation. Has been working with mobility some   Discussed with daughter on phone 8/30  Data Reviewed:   No labs today   DVT prophylaxis: Eliquis   Status is: Inpatient Remains inpatient appropriate because:   Await dispo planning     Subjective:   Overall looks okay No new issues Brighter affect sitting up He is encouraged daily to use his upper extremities and mobilize No chest pain   Objective + exam Vitals:   07/21/24 0044 07/21/24 0510 07/21/24 0510 07/21/24 0815  BP: 90/62 105/66 105/66 99/70  Pulse: 63 88 88 65  Resp: 18 16 16    Temp: (!) 97.2 F (36.2 C) 97.6 F (36.4 C) 97.6 F (36.4 C) 98.4 F (36.9 C)  TempSrc: Oral Oral    SpO2: 95% 100% 100% 94%  Weight:      Height:       Filed Weights    07/16/24 0500 07/18/24 0205 07/19/24 0500  Weight: 69.4 kg 69.4 kg 69.4 kg     Examination:  Cachectic no distress EOMI NCAT no focal deficit but cachectic S1-S2 no murmur Abdomen soft Pure wick in place Upper leg anasarca Wounds not examined today Abdomen soft  Scheduled Meds:  (feeding supplement) PROSource Plus  30 mL Oral BID BM   amiodarone   200 mg Oral Daily   apixaban   5 mg Oral BID   collagenase    Topical Daily   diclofenac  Sodium  4 g Topical QID   feeding supplement  237 mL Oral BID BM   ferrous sulfate   325 mg Oral QHS   fluticasone   1 spray Each Nare Daily   furosemide   20 mg Oral Daily   guaiFENesin   600 mg Oral BID   insulin  aspart  0-9 Units Subcutaneous TID WC   levothyroxine   50 mcg Oral Q0600   lidocaine   1 patch Transdermal Q24H   lidocaine -EPINEPHrine  (PF)  30 mL Infiltration Once   liver oil-zinc  oxide   Topical BID   loratadine   10 mg Oral Daily   multivitamin with minerals  1 tablet Oral Daily   neomycin -bacitracin -polymyxin   Apply externally Daily   nutrition supplement (JUVEN)  1 packet Oral BID BM   mouth rinse  15 mL Mouth Rinse 4 times per day   pantoprazole   40 mg Oral Daily   senna-docusate  1 tablet Oral QHS   sertraline   50 mg Oral Daily  sodium chloride  flush  10-40 mL Intracatheter Q12H   sorbitol   30 mL Oral QHS   Tafamidis   1 capsule Oral Daily   vitamin A   10,000 Units Oral Daily   vutrisiran  sodium  25 mg Subcutaneous Q90 days   Continuous Infusions:  dextrose       Time 45  Jai-Gurmukh Adylene Dlugosz, MD  [facility]

## 2024-07-21 NOTE — Progress Notes (Signed)
 Upon entry into patient room RN met by patient daughter holding a dislodged tube from the wound vac rendering the wound vac inefficient. MD Dr Alfornia notified of current assessment of the situation. Per MD okay to place wet to dry dressing for now, wound team nurse will see in the am to assess and replace wound vac. Will update patient and family of plan.

## 2024-07-22 DIAGNOSIS — A419 Sepsis, unspecified organism: Secondary | ICD-10-CM | POA: Diagnosis not present

## 2024-07-22 DIAGNOSIS — R6520 Severe sepsis without septic shock: Secondary | ICD-10-CM | POA: Diagnosis not present

## 2024-07-22 LAB — COMPREHENSIVE METABOLIC PANEL WITH GFR
ALT: 25 U/L (ref 0–44)
AST: 35 U/L (ref 15–41)
Albumin: 2.5 g/dL — ABNORMAL LOW (ref 3.5–5.0)
Alkaline Phosphatase: 195 U/L — ABNORMAL HIGH (ref 38–126)
Anion gap: 13 (ref 5–15)
BUN: 31 mg/dL — ABNORMAL HIGH (ref 8–23)
CO2: 24 mmol/L (ref 22–32)
Calcium: 8.5 mg/dL — ABNORMAL LOW (ref 8.9–10.3)
Chloride: 105 mmol/L (ref 98–111)
Creatinine, Ser: 0.86 mg/dL (ref 0.61–1.24)
GFR, Estimated: >60 mL/min (ref >60)
Glucose, Bld: 119 mg/dL — ABNORMAL HIGH (ref 70–99)
Potassium: 4.1 mmol/L (ref 3.5–5.1)
Sodium: 142 mmol/L (ref 135–145)
Total Bilirubin: 1.8 mg/dL — ABNORMAL HIGH (ref 0.0–1.2)
Total Protein: 5.6 g/dL — ABNORMAL LOW (ref 6.5–8.1)

## 2024-07-22 LAB — GLUCOSE, CAPILLARY
Glucose-Capillary: 106 mg/dL — ABNORMAL HIGH (ref 70–99)
Glucose-Capillary: 203 mg/dL — ABNORMAL HIGH (ref 70–99)
Glucose-Capillary: 169 mg/dL — ABNORMAL HIGH (ref 70–99)

## 2024-07-22 MED ORDER — ALBUMIN HUMAN 25 % IV SOLN
25.0000 g | Freq: Once | INTRAVENOUS | Status: AC
  Administered 2024-07-22: 25 g via INTRAVENOUS
  Filled 2024-07-22: qty 100

## 2024-07-22 NOTE — Consult Note (Signed)
 WOC Nurse wound follow up Wound type: Unstageable PI left shoulder Stage 4 PI: left trochanter Unstageable PI; right heel Stage 4 PI sacrum Measurement: Left shoulder 1.5 cm x 1 cm Left trochanter: 3.5 cm x 2.5 cm x 2 cm with 4 cm undermining from 12 o'clock to 3 o'clock Right heel: 4 cm x 3 cm Sacrum: 6 cm x 3 cm x 0.5 cm with  2 cm undermining from 12 o'clock to 3 o'clock  Wound bed: Left shoulder:  60% red/pink, moist, 40% yellow slough Left trochanter: 90% red, moist, 10% yellow slough in the superior aspect of wound, less pallor in color today Right heel: 100% covered by black eschar with slight lifting Sacrum: 50% red, moist, 50% scattered yellow slough Drainage (amount, consistency, odor)  Left shoulder: serosanguinous Left trochanter: serosanguinous Right heel: serosanguinous  Sacrum: purulent Periwound: intact Dressing procedure/placement/frequency: 1- Left shoulder Unstageable  Cleanse with NS, pat dry, Apply 1/4 thick layer of Santyl  to wound bed, top with saline moist gauze. Top with silicone foam.  Change daily. 2- Left trochanter: NPWT in place   3 - Sacral Stage 4 Pressure Injury  Cleanse with Vashe#[id number]. Apply Aquacel Ag+ (silver hydrofiber) Lawson # [id number] in the wound bed and fill the undermining using a swab, cover with sacral foam dressing, change daily and PRN soilage  3- Right heel; Unstageable; paint with betadine and leave open to air; offload with pillows or Prevalon boots at all times   Off load left heel with Prevalon boots at all times.   LALM in place for moisture management and pressure redistribution.   Plan of care discussed with daughter, Para Mogel, via telephone call.   Please notify WOC nurses of any acute changes in the wounds or any new areas of concern. All wounds appear recalcitrant at this time.   VAC Therapy: Removed old NPWT dressing; nursing staff had removed foam dressing and placed wet to moist normal saline gauze dressing  that was easily removed. Trac pad was missing on old tubing. Cleansed wound with normal saline, base of wound visualized, palpated bone Periwound skin protected with No-sting swab and window framed with regular drape Skin protected with extra black foam placed under trac tubing and reinforced with drape. Filled wound with  __0_ piece of Mepitel, ___1_ piece of white foam, __1__ piece of black foam  Sealed NPWT dressing at HG Primary nurse was aware that WOC was coming to apply new VAC dressing.  Patient tolerated procedure well without any complaints.  WOC nurse will continue to provide NPWT dressing changed due to the complexity of the dressing change; twice a week.   Left Hip VAC dressing change twice a week. If there is a rapid increase in drainage in the cannister or if there is a rapid increase in bright red drainage; stop unit and contact provider.   Sherrilyn Hals MSN RN [facility] WOC [facility]  [phone] (Available from 7-3 pm [facility]RISTELLA ONEIDA ORN Friday)

## 2024-07-22 NOTE — Plan of Care (Signed)
   Medical records reviewed including progress notes, labs, imaging. Stable overall with plan to replace VAC on hip wound.  Goals of care remain clear for continued medical interventions and hospital is the preferred setting for this care. Hope is for transition to home with outpatient palliative care. Patient is satisfied with current symptom management plan.   PMT will continue to follow peripherally. No other acute needs at this time.   Yvone Slape, PA-C Palliative Medicine Team  Team Phone # [phone]   NO CHARGE

## 2024-07-22 NOTE — Plan of Care (Signed)
  Problem: Clinical Measurements: Goal: Signs and symptoms of infection will decrease Outcome: Progressing   Problem: Education: Goal: Knowledge of risk factors and measures for prevention of condition will improve Outcome: Progressing   Problem: Coping: Goal: Psychosocial and spiritual needs will be supported Outcome: Progressing   Problem: Respiratory: Goal: Will maintain a patent airway Outcome: Progressing   Problem: Education: Goal: Ability to describe self-care measures that may prevent or decrease complications (Diabetes Survival Skills Education) will improve Outcome: Progressing   Problem: Coping: Goal: Ability to adjust to condition or change in health will improve Outcome: Progressing   Problem: Fluid Volume: Goal: Ability to maintain a balanced intake and output will improve Outcome: Progressing   Problem: Health Behavior/Discharge Planning: Goal: Ability to manage health-related needs will improve Outcome: Progressing   Problem: Tissue Perfusion: Goal: Adequacy of tissue perfusion will improve Outcome: Progressing   Problem: Education: Goal: Knowledge of General Education information will improve Description: Including pain rating scale, medication(s)/side effects and non-pharmacologic comfort measures Outcome: Progressing   Problem: Clinical Measurements: Goal: Respiratory complications will improve Outcome: Progressing   Problem: Clinical Measurements: Goal: Cardiovascular complication will be avoided Outcome: Progressing   Problem: Coping: Goal: Level of anxiety will decrease Outcome: Progressing   Problem: Elimination: Goal: Will not experience complications related to urinary retention Outcome: Progressing   Problem: Safety: Goal: Ability to remain free from injury will improve Outcome: Progressing

## 2024-07-22 NOTE — Progress Notes (Addendum)
 [facility]   ROUNDING   NOTE Audy Soderquist FMW:[id number]  DOB: 1941-08-05  DOA: 03/04/2024  PCP: [name], MD  07/22/2024,4:14 PM  LOS: 140 days    Code Status: DNR limited code     from: Home current Dispo: Unclear    83 year old black male DM TY 2 Chronic A-fib CHADVASC >4 on Eliquis  he HTN HLD DM TY 2 with labile sugars A1c 8.4 in the past MGUS diagnosed in 2004 last seen by Dr. 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 additionally Cardiac amyloid previously on antitransthyretin injection every 3 monthly/baclofen   He had just been discharged from the hospital 3/26-4/10 with generalized weakness and baclofen  was discontinued because of his encephalopathy and other issues including COVID Apparently there was a skin injury with significant pain discomfort which progressed 4 to 5 days before coming back to the ED General Surgery orthopedics and ID all were consulted  Plan  Stage IV sacral decubiti See summarization of events   Stage IV sacral decubitus wound/ulcer with severe sepsis on admission  - Culture data as above. -4/14-CT showed sacral wound extension to the right gluteus muscle without signs of osteomyelitis. -4/15-underwent bedside debridement of sacral decubitus wound by general surgery -4/21-ID consulted and recommended continuing broad-spectrum antibiotics that he completed on 6/13. 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 -6/17- ID recommended continuing holding off antibiotics since he is hemodynamically stable, afebrile without leukocytosis. -6/17-evaluated by plastic surgery.  Sacral wound felt to be clean.  Wound care recommended -7/13-7/13-CT showed similar appearance of osteomyelitis underlying sacral decubitus ulcer.  -7/13-plastic surgery recommended for continued wound care and no surgical  management. -7/14-Ortho evaluated patient for left hip wound.  Debulked and recommended optimizing nutrition -8/5- Surgery recommended hydrotherapy for left hip wound. Surgical debridement if not successful -8/5-wound care and instruction updated  -8/15-left hip wound worse with tunneling. Daughter concerned about this.  X-ray with left lateral hip soft tissue defect.  No acute bony abnormality.  -Overall, difficult case due to patient's nutritional status and limited mobility.  Prealbumin <5. - CT scan of the hip 8/15 with new left hip wound which extends deep, 2.8 cm without evidence of acute osteolysis involving the underlying left greater trochanter.  Ortho was reconsulted on 07/12/2024: Recommended conservative management.  Continue wound care as per wound care consult recommendations.  Dressing procedure/placement/frequency:--updated 07/22/24 1- Left shoulder Unstageable  Cleanse with NS, pat dry, Apply 1/4 thick layer of Santyl  to wound bed, top with saline moist gauze. Top with silicone foam.  Change daily. 2- Left trochanter: NPWT in place   3 - Sacral Stage 4 Pressure Injury  Cleanse with Vashe#[id number]. Apply Aquacel Ag+ (silver hydrofiber) Lawson # [phone] in the wound bed and fill the undermining using a swab, cover with sacral foam dressing, change daily and PRN soilage  3- Right heel; Unstageable; paint with betadine and leave open to air; offload with pillows or Prevalon boots at all times   Very poor surgical candidate per orthopedics Dr. [name] who saw him and recommended packing wound with 4 x 4 gauze soaked in Vashe-has had a wound VAC on since-07/16/2024  All wounds will be reviewed in the next several days on rounds I will give albumin  25 twice daily times several doses and see if this makes a difference to his nutritional state  Hypoglycemia NOS Discontinued all long-acting coverage-CBGs are below 180 he is not hypoglycemic  Severe sepsis on admission Present on admission and  likely secondary to infected area[s] continue wound care  Aspiration pneumonia on 8/18 Completed 5 days of Rocephin  and Flagyl  on 8/22 SLP input greatly appreciated--will ask speech therapy to revisit as daughter has specific questions about different textures of foods and the diet orders here do not make sense-he wants sweet potato but that is not on the dysphagia diet but regular potatoes which does not make any sense to her-I will liberalize to regular diet today  and ask that Speech follow-up for consistencies and discussion with the family  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 /Vutusiran---recent goal-directed therapy for heart issues is limited Now on  Lasix  to 20 daily Is - 5.2 neg L Will probably give some IV albumin  as above  Permanent A-fib continues amiodarone  200 daily as well as Eliquis  5 twice daily  Intermittent azotemia Get labs tomorrow  Transaminitis Resolved--Statin held because of transaminitis Could be effect of amiodarone  but no real other options given marginal blood pressures  Acute superimposed on chronic pain Continues on Tylenol  650 every 6 as needed mild pain Oxy IR 2.5 daily as needed severe pain  Anxiety and adjustment disorder consulted psychiatry on 05/27/24 and is currently on Zoloft  50  CVA 2018 Dysphagia Protein energy malnutrition severe continue supplementation. Has been working with mobility some   Discussed with daughter on phone 8/30  Data Reviewed:   Sodium 142 potassium 4.1 BUN/creatinine 31/0.8 Alk phos 195 AST/ALT 35/25 bilirubin 1.8   DVT prophylaxis: Eliquis   Status is: Inpatient Remains inpatient appropriate because:   Await dispo planning     Subjective:   Looks well Daughter in room No fever no chills no nausea no vomiting ROM intact He seems to be rather comfortable Daughter had specific questions about the diet and getting it changed   Objective + exam Vitals:    07/22/24 0053 07/22/24 0529 07/22/24 0840 07/22/24 1228  BP: 101/64 (!) 98/57 (!) 105/58 110/66  Pulse: 65 64 65 66  Resp: 17 17    Temp: 98.2 F (36.8 C) 98.3 F (36.8 C) 98.3 F (36.8 C) 98 F (36.7 C)  TempSrc:      SpO2: 100% 97% 97% 97%  Weight:      Height:       Filed Weights   07/16/24 0500 07/18/24 0205 07/19/24 0500  Weight: 69.4 kg 69.4 kg 69.4 kg     Examination:  Cachectic no distress Chest is clear no wheeze rales rhonchi  Soft Some anasarca Wounds not examined today S1-S2 no murmur  Scheduled Meds:  (feeding supplement) PROSource Plus  30 mL Oral BID BM   amiodarone   200 mg Oral Daily   apixaban   5 mg Oral BID   collagenase    Topical Daily   diclofenac  Sodium  4 g Topical QID   feeding supplement  237 mL Oral BID BM   ferrous sulfate   325 mg Oral QHS   fluticasone   1 spray Each Nare Daily   furosemide   20 mg Oral Daily   guaiFENesin   600 mg Oral BID   insulin  aspart  0-9 Units Subcutaneous TID WC   levothyroxine   50 mcg Oral Q0600   lidocaine   1 patch Transdermal Q24H   lidocaine -EPINEPHrine  (PF)  30 mL Infiltration Once   liver oil-zinc  oxide   Topical BID   loratadine   10 mg Oral Daily   multivitamin with minerals  1 tablet Oral Daily   neomycin -bacitracin -polymyxin   Apply externally  Daily   nutrition supplement (JUVEN)  1 packet Oral BID BM   mouth rinse  15 mL Mouth Rinse 4 times per day   pantoprazole   40 mg Oral Daily   senna-docusate  1 tablet Oral QHS   sertraline   50 mg Oral Daily   sodium chloride  flush  10-40 mL Intracatheter Q12H   sorbitol   30 mL Oral QHS   Tafamidis   1 capsule Oral Daily   vitamin A   10,000 Units Oral Daily   vutrisiran  sodium  25 mg Subcutaneous Q90 days   Continuous Infusions:  dextrose       Time 25  Jai-Gurmukh Kayte Borchard, MD  [facility]

## 2024-07-22 NOTE — Plan of Care (Signed)
  Problem: Fluid Volume: Goal: Hemodynamic stability will improve Outcome: Progressing   Problem: Clinical Measurements: Goal: Diagnostic test results will improve Outcome: Progressing Goal: Signs and symptoms of infection will decrease Outcome: Progressing   Problem: Education: Goal: Knowledge of risk factors and measures for prevention of condition will improve Outcome: Progressing   Problem: Coping: Goal: Psychosocial and spiritual needs will be supported Outcome: Progressing   Problem: Respiratory: Goal: Will maintain a patent airway Outcome: Progressing Goal: Complications related to the disease process, condition or treatment will be avoided or minimized Outcome: Progressing   Problem: Education: Goal: Ability to describe self-care measures that may prevent or decrease complications (Diabetes Survival Skills Education) will improve Outcome: Progressing   Problem: Coping: Goal: Ability to adjust to condition or change in health will improve Outcome: Progressing   Problem: Fluid Volume: Goal: Ability to maintain a balanced intake and output will improve Outcome: Progressing   Problem: Health Behavior/Discharge Planning: Goal: Ability to manage health-related needs will improve Outcome: Progressing   Problem: Metabolic: Goal: Ability to maintain appropriate glucose levels will improve Outcome: Progressing   Problem: Nutritional: Goal: Maintenance of adequate nutrition will improve Outcome: Progressing Goal: Progress toward achieving an optimal weight will improve Outcome: Progressing   Problem: Skin Integrity: Goal: Risk for impaired skin integrity will decrease Outcome: Progressing   Problem: Tissue Perfusion: Goal: Adequacy of tissue perfusion will improve Outcome: Progressing   Problem: Education: Goal: Knowledge of General Education information will improve Description: Including pain rating scale, medication(s)/side effects and non-pharmacologic  comfort measures Outcome: Progressing   Problem: Health Behavior/Discharge Planning: Goal: Ability to manage health-related needs will improve Outcome: Progressing   Problem: Clinical Measurements: Goal: Ability to maintain clinical measurements within normal limits will improve Outcome: Progressing Goal: Will remain free from infection Outcome: Progressing Goal: Diagnostic test results will improve Outcome: Progressing Goal: Respiratory complications will improve Outcome: Progressing Goal: Cardiovascular complication will be avoided Outcome: Progressing   Problem: Activity: Goal: Risk for activity intolerance will decrease Outcome: Progressing   Problem: Nutrition: Goal: Adequate nutrition will be maintained Outcome: Progressing   Problem: Coping: Goal: Level of anxiety will decrease Outcome: Progressing   Problem: Elimination: Goal: Will not experience complications related to bowel motility Outcome: Progressing Goal: Will not experience complications related to urinary retention Outcome: Progressing   Problem: Pain Managment: Goal: General experience of comfort will improve and/or be controlled Outcome: Progressing   Problem: Safety: Goal: Ability to remain free from injury will improve Outcome: Progressing   Problem: Skin Integrity: Goal: Risk for impaired skin integrity will decrease Outcome: Progressing

## 2024-07-23 ENCOUNTER — Other Ambulatory Visit: Payer: Self-pay

## 2024-07-23 DIAGNOSIS — A419 Sepsis, unspecified organism: Secondary | ICD-10-CM | POA: Diagnosis not present

## 2024-07-23 DIAGNOSIS — R6520 Severe sepsis without septic shock: Secondary | ICD-10-CM | POA: Diagnosis not present

## 2024-07-23 LAB — CBC WITH DIFFERENTIAL/PLATELET
Abs Immature Granulocytes: 0.06 K/uL (ref 0.00–0.07)
Basophils Absolute: 0 K/uL (ref 0.0–0.1)
Basophils Relative: 1 %
Eosinophils Absolute: 0.1 K/uL (ref 0.0–0.5)
Eosinophils Relative: 2 %
HCT: 32 % — ABNORMAL LOW (ref 39.0–52.0)
Hemoglobin: 10.9 g/dL — ABNORMAL LOW (ref 13.0–17.0)
Immature Granulocytes: 1 %
Lymphocytes Relative: 19 %
Lymphs Abs: 1.1 K/uL (ref 0.7–4.0)
MCH: 29.5 pg (ref 26.0–34.0)
MCHC: 34.1 g/dL (ref 30.0–36.0)
MCV: 86.5 fL (ref 80.0–100.0)
Monocytes Absolute: 0.9 K/uL (ref 0.1–1.0)
Monocytes Relative: 16 %
Neutro Abs: 3.4 K/uL (ref 1.7–7.7)
Neutrophils Relative %: 61 %
Platelets: 271 K/uL (ref 150–400)
RBC: 3.7 MIL/uL — ABNORMAL LOW (ref 4.22–5.81)
RDW: 19.4 % — ABNORMAL HIGH (ref 11.5–15.5)
WBC: 5.6 K/uL (ref 4.0–10.5)
nRBC: 0 % (ref 0.0–0.2)

## 2024-07-23 LAB — BASIC METABOLIC PANEL WITH GFR
Anion gap: 14 (ref 5–15)
BUN: 30 mg/dL — ABNORMAL HIGH (ref 8–23)
CO2: 24 mmol/L (ref 22–32)
Calcium: 8.5 mg/dL — ABNORMAL LOW (ref 8.9–10.3)
Chloride: 102 mmol/L (ref 98–111)
Creatinine, Ser: 0.88 mg/dL (ref 0.61–1.24)
GFR, Estimated: >60 mL/min (ref >60)
Glucose, Bld: 144 mg/dL — ABNORMAL HIGH (ref 70–99)
Potassium: 4 mmol/L (ref 3.5–5.1)
Sodium: 140 mmol/L (ref 135–145)

## 2024-07-23 LAB — GLUCOSE, CAPILLARY
Glucose-Capillary: 128 mg/dL — ABNORMAL HIGH (ref 70–99)
Glucose-Capillary: 136 mg/dL — ABNORMAL HIGH (ref 70–99)
Glucose-Capillary: 147 mg/dL — ABNORMAL HIGH (ref 70–99)
Glucose-Capillary: 154 mg/dL — ABNORMAL HIGH (ref 70–99)
Glucose-Capillary: 157 mg/dL — ABNORMAL HIGH (ref 70–99)

## 2024-07-23 MED ORDER — OXYCODONE HCL 5 MG PO TABS: 2.5000 mg | ORAL_TABLET | Freq: Once | ORAL | Status: DC | PRN

## 2024-07-23 MED ORDER — OXYCODONE HCL 5 MG PO TABS
2.5000 mg | ORAL_TABLET | Freq: Once | ORAL | Status: AC | PRN
  Administered 2024-07-23: 2.5 mg via ORAL
  Filled 2024-07-23: qty 1

## 2024-07-23 MED ORDER — NALOXONE HCL 0.4 MG/ML IJ SOLN: 0.4000 mg | INTRAMUSCULAR | Status: DC | PRN

## 2024-07-23 NOTE — Progress Notes (Signed)
 [facility]   ROUNDING   NOTE Earl [name] FMW:[id number]  DOB: October 14, 1941  DOA: 03/04/2024  PCP: Earl Madden, MD  07/23/2024,5:20 PM  LOS: 141 days    Code Status: DNR limited code     from: Home current Dispo: Unclear    83 year old black male DM TY 2 Chronic A-fib CHADVASC >4 on Eliquis  he HTN HLD DM TY 2 with labile sugars A1c 8.4 in the past MGUS diagnosed in 2004 last seen by Dr. 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 additionally Cardiac amyloid previously on antitransthyretin injection every 3 monthly/baclofen   He had just been discharged from the hospital 3/26-4/10 with generalized weakness and baclofen  was discontinued because of his encephalopathy and other issues including COVID Apparently there was a skin injury with significant pain discomfort which progressed 4 to 5 days before coming back to the ED General Surgery orthopedics and ID all were consulted  Plan  Stage IV sacral decubiti See summarization of events   Stage IV sacral decubitus wound/ulcer with severe sepsis on admission  - Culture data as above. -4/14-CT showed sacral wound extension to the right gluteus muscle without signs of osteomyelitis. -4/15-underwent bedside debridement of sacral decubitus wound by general surgery -4/21-ID consulted and recommended continuing broad-spectrum antibiotics that he completed on 6/13. 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 -6/17- ID recommended continuing holding off antibiotics since he is hemodynamically stable, afebrile without leukocytosis. -6/17-evaluated by plastic surgery.  Sacral wound felt to be clean.  Wound care recommended -7/13-7/13-CT showed similar appearance of osteomyelitis underlying sacral decubitus ulcer.  -7/13-plastic surgery recommended for continued wound care and no surgical  management. -7/14-Ortho evaluated patient for left hip wound.  Debulked and recommended optimizing nutrition -8/5- Surgery recommended hydrotherapy for left hip wound. Surgical debridement if not successful -8/5-wound care and instruction updated  -8/15-left hip wound worse with tunneling. Daughter concerned about this.  X-ray with left lateral hip soft tissue defect.  No acute bony abnormality.  -Overall, difficult case due to patient's nutritional status and limited mobility.  Prealbumin <5. - CT scan of the hip 8/15 with new left hip wound which extends deep, 2.8 cm without evidence of acute osteolysis involving the underlying left greater trochanter.  Ortho was reconsulted on 07/12/2024: Recommended conservative management.  Continue wound care as per wound care consult recommendations.  Updated 07/22/24 Dressing procedure/placement/frequency: 1- Left shoulder Unstageable  Cleanse with NS, pat dry, Apply 1/4 thick layer of Santyl  to wound bed, top with saline moist gauze. Top with silicone foam.  Change daily. 2- Left trochanter: NPWT in place   3 - Sacral Stage 4 Pressure Injury  Cleanse with Vashe#[id number]. Apply Aquacel Ag+ (silver hydrofiber) Lawson # [phone] in the wound bed and fill the undermining using a swab, cover with sacral foam dressing, change daily and PRN soilage  3- Right heel; Unstageable; Aquacel dressing; offload with pillows or Prevalon boots at all times   Off load left heel with Prevalon boots at all times.    Very poor surgical candidate per orthopedics Dr. [name] who saw him and recommended packing wound with 4 x 4 gauze soaked in Vashe-has had a wound VAC on since-07/16/2024  give albumin  25 twice daily times several doses and see if this makes a difference to his nutritional state, and anasarca  Hypoglycemia NOS Discontinued all long-acting coverage-CBGs are below 180 he is not hypoglycemic Keep hypoglycemic protocol  Severe  sepsis on admission Present on admission  and likely secondary to infected area[s] continue wound care as above  Aspiration pneumonia on 8/18 Completed 5 days of Rocephin  and Flagyl  on 8/22 SLP input greatly appreciated--speech therapy recommending dysphagia 3 although this is not allowing several choices to the patient per daughter Patient has been coughing with regular consistencies today -risk of aspiration remains so I will get a chest x-ray in the morning and discuss my concerns with the daughter on the phone  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 /Vutusiran---recent goal-directed therapy for heart issues is limited Now on  Lasix  to 20 daily Is - 6.8 neg L  IV albumin  as above  Permanent A-fib continues amiodarone  200 daily as well as Eliquis  5 twice daily  Intermittent azotemia Kidney function mostly at baseline 9/2  Transaminitis Resolved--Statin held because of transaminitis Could be effect of amiodarone  but no real other options given marginal blood pressures  Acute superimposed on chronic pain Continues on Tylenol  650 every 6 as needed mild pain Oxy IR 2.5 daily as needed severe pain  Anxiety and adjustment disorder consulted psychiatry on 05/27/24 and is currently on Zoloft  50  CVA 2018 Dysphagia Protein energy malnutrition severe continue supplementation. Has been working with mobility some   Discussed with daughter  in person on 9/1  Data Reviewed:   WBC 5.6 hemoglobin 10.9 platelet 271 Sodium 140 potassium 4.0 BUN/creatinine 30/0.8  DVT prophylaxis: Eliquis   Status is: Inpatient Remains inpatient appropriate because:   Await dispo planning     Subjective:  Overall about the same Nursing reports coughing significantly with regular consistencies although eating about 60% of meal in the morning Had Magic cup in the afternoon because was coughing so bad with regular consistencies that nursing stopped feeds   Objective + exam Vitals:   07/22/24 2358  07/23/24 0534 07/23/24 0722 07/23/24 1214  BP: 113/70 104/64 100/63 114/69  Pulse: 73 66 69 71  Resp:   18 19  Temp: 98.4 F (36.9 C) 98.5 F (36.9 C) 98.3 F (36.8 C) 97.8 F (36.6 C)  TempSrc: Oral Oral Oral   SpO2: 97% 96% 93% 96%  Weight:      Height:       Filed Weights   07/16/24 0500 07/18/24 0205 07/19/24 0500  Weight: 69.4 kg 69.4 kg 69.4 kg     Examination:  Cachectic no distress Chest is clear no wheeze rales rhonchi S1-S2 no murmur Some anasarca and upper legs Wounds not examined today   Scheduled Meds:  (feeding supplement) PROSource Plus  30 mL Oral BID BM   amiodarone   200 mg Oral Daily   apixaban   5 mg Oral BID   collagenase    Topical Daily   diclofenac  Sodium  4 g Topical QID   feeding supplement  237 mL Oral BID BM   ferrous sulfate   325 mg Oral QHS   fluticasone   1 spray Each Nare Daily   furosemide   20 mg Oral Daily   guaiFENesin   600 mg Oral BID   insulin  aspart  0-9 Units Subcutaneous TID WC   levothyroxine   50 mcg Oral Q0600   lidocaine   1 patch Transdermal Q24H   lidocaine -EPINEPHrine  (PF)  30 mL Infiltration Once   liver oil-zinc  oxide   Topical BID   loratadine   10 mg Oral Daily   multivitamin with minerals  1 tablet Oral Daily   neomycin -bacitracin -polymyxin   Apply externally Daily   nutrition supplement (JUVEN)  1 packet Oral BID  BM   mouth rinse  15 mL Mouth Rinse 4 times per day   pantoprazole   40 mg Oral Daily   senna-docusate  1 tablet Oral QHS   sertraline   50 mg Oral Daily   sodium chloride  flush  10-40 mL Intracatheter Q12H   sorbitol   30 mL Oral QHS   Tafamidis   1 capsule Oral Daily   vitamin A   10,000 Units Oral Daily   vutrisiran  sodium  25 mg Subcutaneous Q90 days   Continuous Infusions:  dextrose       Time 35  Jai-Gurmukh Omni Dunsworth, MD  [facility]

## 2024-07-23 NOTE — Progress Notes (Signed)
 Fed the patient with lunch /regular diet. Developed cough so badly. And stopped feeding . He is done with magic cup and mashed banana.  Made the provider aware.

## 2024-07-23 NOTE — Progress Notes (Addendum)
 SLP Note  Patient Details Name: Earl [name] MRN: [id number] DOB: 1941/02/03   Provider and RN reached out to this therapist to indicate pt has been coughing with regular diet and thin liquids today.  SLP will downgrade his diet back to dysphagia 3, nectar-thick liquids until he can be reassessed tomorrow.  A voicemail re: the above was left with pt's daughter, Para.  Addendum: Para returned my call. She prefers we continue current diet of regular/thin liquids. She will provide assistance during dinner this evening and will thicken liquids to nectar as needed (per recommendations from most recent MBS,  SLP will follow next date. Continue regular solids/thin liquids.  Brittyn Salaz L. Vona, MA CCC/SLP Clinical Specialist - Acute Care SLP Acute Rehabilitation Services Office number [phone]         Vona Palma Laurice 07/23/2024, 3:17 PM

## 2024-07-23 NOTE — Progress Notes (Signed)
 Patient is fed with regular diet. About 60% done. He was coughing in between.  But tolerated with more time consuming.  Made the provider aware

## 2024-07-23 NOTE — Progress Notes (Signed)
 Specialty Pharmacy Refill Coordination Note  Earl [name] is a 83 y.o. male contacted today regarding refills of specialty medication(s) Tafamidis    Patient requested Delivery   Delivery date: 07/25/24   Verified address: [address] [place] [address]   Medication will be filled on 07/24/24.

## 2024-07-23 NOTE — Progress Notes (Signed)
 Mobility Specialist: Progress Note   07/23/24 1500  Mobility  Activity  (HEP)  Range of Motion/Exercises Active Assistive;Active;Right arm;Left arm;Right leg;Left leg  Activity Response Tolerated well  Mobility Referral Yes  Mobility visit 1 Mobility  Mobility Specialist Start Time (ACUTE ONLY) 1135  Mobility Specialist Stop Time (ACUTE ONLY) 1211  Mobility Specialist Time Calculation (min) (ACUTE ONLY) 36 min     Pt received in bed, agreeable to mobility session. Performed UE and LE exercises, tolerated well. Left in bed with all needs met, call bell in reach.    UE (with theraband): chest press, front raises, crossbody punches, overhead press (10-15x each), passive shoulder stretch (3x each arm) LE: bicycles (3 rounds, 20s each  Ileana Lute Mobility Specialist Please contact via SecureChat or Rehab office at [phone]

## 2024-07-23 NOTE — Plan of Care (Signed)
  Problem: Clinical Measurements: Goal: Signs and symptoms of infection will decrease Outcome: Progressing   Problem: Education: Goal: Knowledge of risk factors and measures for prevention of condition will improve Outcome: Progressing   Problem: Respiratory: Goal: Will maintain a patent airway Outcome: Progressing   Problem: Respiratory: Goal: Complications related to the disease process, condition or treatment will be avoided or minimized Outcome: Progressing   Problem: Health Behavior/Discharge Planning: Goal: Ability to manage health-related needs will improve Outcome: Progressing   Problem: Metabolic: Goal: Ability to maintain appropriate glucose levels will improve Outcome: Progressing   Problem: Education: Goal: Knowledge of General Education information will improve Description: Including pain rating scale, medication(s)/side effects and non-pharmacologic comfort measures Outcome: Progressing   Problem: Safety: Goal: Ability to remain free from injury will improve Outcome: Progressing

## 2024-07-23 NOTE — Progress Notes (Signed)
 Patient's daughter Para phoned this RN and asked to leave the dinner tray till she gets here.

## 2024-07-24 DIAGNOSIS — A419 Sepsis, unspecified organism: Secondary | ICD-10-CM | POA: Diagnosis not present

## 2024-07-24 DIAGNOSIS — R6520 Severe sepsis without septic shock: Secondary | ICD-10-CM | POA: Diagnosis not present

## 2024-07-24 LAB — CBC WITH DIFFERENTIAL/PLATELET
Abs Immature Granulocytes: 0.05 K/uL (ref 0.00–0.07)
Basophils Absolute: 0 K/uL (ref 0.0–0.1)
Basophils Relative: 1 %
Eosinophils Absolute: 0.1 K/uL (ref 0.0–0.5)
Eosinophils Relative: 2 %
HCT: 31 % — ABNORMAL LOW (ref 39.0–52.0)
Hemoglobin: 10.6 g/dL — ABNORMAL LOW (ref 13.0–17.0)
Immature Granulocytes: 1 %
Lymphocytes Relative: 18 %
Lymphs Abs: 1 K/uL (ref 0.7–4.0)
MCH: 29.7 pg (ref 26.0–34.0)
MCHC: 34.2 g/dL (ref 30.0–36.0)
MCV: 86.8 fL (ref 80.0–100.0)
Monocytes Absolute: 1 K/uL (ref 0.1–1.0)
Monocytes Relative: 17 %
Neutro Abs: 3.4 K/uL (ref 1.7–7.7)
Neutrophils Relative %: 61 %
Platelets: 248 K/uL (ref 150–400)
RBC: 3.57 MIL/uL — ABNORMAL LOW (ref 4.22–5.81)
RDW: 19.5 % — ABNORMAL HIGH (ref 11.5–15.5)
WBC: 5.7 K/uL (ref 4.0–10.5)
nRBC: 0 % (ref 0.0–0.2)

## 2024-07-24 LAB — GLUCOSE, CAPILLARY
Glucose-Capillary: 120 mg/dL — ABNORMAL HIGH (ref 70–99)
Glucose-Capillary: 182 mg/dL — ABNORMAL HIGH (ref 70–99)
Glucose-Capillary: 150 mg/dL — ABNORMAL HIGH (ref 70–99)
Glucose-Capillary: 140 mg/dL — ABNORMAL HIGH (ref 70–99)

## 2024-07-24 LAB — BASIC METABOLIC PANEL WITH GFR
Anion gap: 11 (ref 5–15)
BUN: 33 mg/dL — ABNORMAL HIGH (ref 8–23)
CO2: 25 mmol/L (ref 22–32)
Calcium: 8.4 mg/dL — ABNORMAL LOW (ref 8.9–10.3)
Chloride: 105 mmol/L (ref 98–111)
Creatinine, Ser: 0.8 mg/dL (ref 0.61–1.24)
GFR, Estimated: >60 mL/min (ref >60)
Glucose, Bld: 135 mg/dL — ABNORMAL HIGH (ref 70–99)
Potassium: 4.1 mmol/L (ref 3.5–5.1)
Sodium: 141 mmol/L (ref 135–145)

## 2024-07-24 NOTE — Plan of Care (Signed)
  Problem: Fluid Volume: Goal: Hemodynamic stability will improve Outcome: Progressing   Problem: Clinical Measurements: Goal: Diagnostic test results will improve Outcome: Progressing Goal: Signs and symptoms of infection will decrease Outcome: Progressing   Problem: Education: Goal: Knowledge of risk factors and measures for prevention of condition will improve Outcome: Progressing   Problem: Coping: Goal: Psychosocial and spiritual needs will be supported Outcome: Progressing   Problem: Respiratory: Goal: Will maintain a patent airway Outcome: Progressing Goal: Complications related to the disease process, condition or treatment will be avoided or minimized Outcome: Progressing   Problem: Education: Goal: Ability to describe self-care measures that may prevent or decrease complications (Diabetes Survival Skills Education) will improve Outcome: Progressing   Problem: Coping: Goal: Ability to adjust to condition or change in health will improve Outcome: Progressing   Problem: Fluid Volume: Goal: Ability to maintain a balanced intake and output will improve Outcome: Progressing   Problem: Health Behavior/Discharge Planning: Goal: Ability to manage health-related needs will improve Outcome: Progressing   Problem: Metabolic: Goal: Ability to maintain appropriate glucose levels will improve Outcome: Progressing   Problem: Nutritional: Goal: Maintenance of adequate nutrition will improve Outcome: Progressing Goal: Progress toward achieving an optimal weight will improve Outcome: Progressing   Problem: Skin Integrity: Goal: Risk for impaired skin integrity will decrease Outcome: Progressing   Problem: Tissue Perfusion: Goal: Adequacy of tissue perfusion will improve Outcome: Progressing   Problem: Education: Goal: Knowledge of General Education information will improve Description: Including pain rating scale, medication(s)/side effects and non-pharmacologic  comfort measures Outcome: Progressing   Problem: Health Behavior/Discharge Planning: Goal: Ability to manage health-related needs will improve Outcome: Progressing   Problem: Clinical Measurements: Goal: Ability to maintain clinical measurements within normal limits will improve Outcome: Progressing Goal: Will remain free from infection Outcome: Progressing Goal: Diagnostic test results will improve Outcome: Progressing Goal: Respiratory complications will improve Outcome: Progressing Goal: Cardiovascular complication will be avoided Outcome: Progressing   Problem: Activity: Goal: Risk for activity intolerance will decrease Outcome: Progressing   Problem: Nutrition: Goal: Adequate nutrition will be maintained Outcome: Progressing   Problem: Coping: Goal: Level of anxiety will decrease Outcome: Progressing   Problem: Elimination: Goal: Will not experience complications related to bowel motility Outcome: Progressing Goal: Will not experience complications related to urinary retention Outcome: Progressing   Problem: Pain Managment: Goal: General experience of comfort will improve and/or be controlled Outcome: Progressing   Problem: Safety: Goal: Ability to remain free from injury will improve Outcome: Progressing   Problem: Skin Integrity: Goal: Risk for impaired skin integrity will decrease Outcome: Progressing

## 2024-07-24 NOTE — Progress Notes (Signed)
 PROGRESS NOTE    Earl [name]  FMW:[id number] DOB: [birth year] DOA: 03/04/2024 PCP: [name], MD     Brief Narrative:  Earl [name] is an 83 year old M with PMH of PAF on Eliquis , HFmrEF, amyloid cardiomyopathy, NIDDM-2, CVA with right-sided weakness, stage IV sacral wound, pancytopenia, MGUS, HTN, HLD and hypothyroidism brought to ED by EMS from [facility] rehab due to pain in sacral area and shortness of breath 4 days after he was discharged from the hospital.  Patient was hospitalized from 3/26-4/10 with generalized weakness, encephalopathy and COVID-19 infection.  Per daughter, patient sustained skin injury during this hospitalization, and he had significant pain, discomfort and progressive foul-smelling discharge from sacral wound over the course of 4 to 5 days before coming back to ED. he was admitted due to persistent wounds.  General surgery, orthopedic surgery, ID all consulted.  Significant imaging / results / micro data: 4/14-COVID-19 PCR positive.  Flu and RSV PCR nonreactive 4/14-blood cultures NGTD 6/4-sacral wound culture with Pseudomonas aeruginosa and Klebsiella pneumoniae 7/14-pleural fluid culture NGTD  New events last 24 hours / Subjective: No new events, remains edematous due to third spacing and dependent edema. He is on PO lasix    Assessment & Plan:   Principal Problem:   Severe sepsis (HCC) Active Problems:   Paroxysmal atrial fibrillation (HCC)   Sepsis (HCC)   Atrial flutter (HCC)   Sacral ulcer (HCC)   MGUS (monoclonal gammopathy of unknown significance)   Essential hypertension   Hyperlipidemia   Insulin  dependent type 2 diabetes mellitus (HCC)   History of CVA (cerebrovascular accident)   Cardiac amyloidosis (HCC)   Chronic systolic CHF (congestive heart failure) (HCC)   GERD (gastroesophageal reflux disease)   History of hypertension   Atrial fibrillation with RVR (HCC)   Community acquired pneumonia   Protein-calorie  malnutrition, severe   Wild-type transthyretin-related (ATTR) amyloidosis (HCC)   Sacral decubitus ulcer, stage IV (HCC)   Pressure injury of skin of trochanteric region of left hip    Stage IV sacral decubitus wound/ulcer with severe sepsis on admission  - Culture data as above - 4/14-CT showed sacral wound extension to the right gluteus muscle without signs of osteomyelitis. - 4/15-underwent bedside debridement of sacral decubitus wound by general surgery - 4/21-ID consulted and recommended continuing broad-spectrum antibiotics that he completed on 6/13. 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 - 6/17- ID recommended continuing holding off antibiotics since he is hemodynamically stable, afebrile without leukocytosis. - 6/17-evaluated by plastic surgery.  Sacral wound felt to be clean.  Wound care recommended - 7/13-7/13-CT showed similar appearance of osteomyelitis underlying sacral decubitus ulcer.  - 7/13-plastic surgery recommended for continued wound care and no surgical management. - 7/14-Ortho evaluated patient for left hip wound.  Debulked and recommended optimizing nutrition - 8/5- Surgery recommended hydrotherapy for left hip wound. Surgical debridement if not successful -8/5-wound care and instruction updated  - 8/15-left hip wound worse with tunneling. Daughter concerned about this.  X-ray with left lateral hip soft tissue defect.  No acute bony abnormality.  -Overall, difficult case due to patient's nutritional status and limited mobility.  Prealbumin <5. - CT scan of the hip 8/15 with new left hip wound which extends deep, 2.8 cm without evidence of acute osteolysis involving the underlying left greater trochanter.  Ortho was reconsulted on 07/12/2024: Recommended conservative management.  Continue wound care as per wound care consult recommendations. - The pads on top of the heel wound are not  sticking well due  to dry and flaky skin.  Discussed with the bedside RN to put the foam pads and then wrap the foot and ankle to prevent them from sliding follow wound care consult recommendations.   Severe sepsis  - Present on admission.  Resolved.   Aspiration pneumonia  - Patient with worsening congestion, increased cough on 8/18.  Chest x-ray 8/18 with lower lobes collapsed and possible underlying pneumonia, likely due to being in bed for prolonged amount of time.  Completed 5 days of Rocephin  and Flagyl  on 07/12/2024. - Diet as per SLP's recommendations   Acute on chronic combined CHF, cardiac amyloidosis, nonischemic cardiomyopathy, hypotension - On tafamidis  and vutrisiran  for amyloid.  Goal-directed medical therapy somewhat limited by prior episodes of hypotension.   - Has received few doses of IV Lasix  recently along with IV albumin .  Currently on oral Lasix . - Continue Tafamidis  and vutrisiran . - Ace wrap for BLE edema - Strict intake and output, daily weights, renal functions and electrolytes   Elevated LFTs  - Lipitor still on hold.  Monitor intermittently.   Acute on chronic pain - Continue current pain management with lidocaine  patch and as needed oxycodone     PAF - Continue amiodarone  and Eliquis .  Currently rate controlled.  No bleeding   Anemia of chronic disease - From chronic illnesses.  Monitor intermittently.  No signs of bleeding.  Hemoglobin currently stable.   History of CVA - Stable, on Eliquis , holding Lipitor due to LFTs   Concern for acute cholecystitis - Ruled out, right upper quadrant ultrasound initially raise concern, however HIDA scan was negative.  This is likely due to anasarca/third spacing.     Dysphagia/severe protein calorie malnutrition - Continue supplementation, pured diet.  Follow-up SLP and nutrition recommendations   Physical debilitation, muscle weakness - PT/OT ongoing   Goals of care  - Daughter wants him to transition to home once his wounds are  better.  Prior hospitalist discussed with daughter on 07/07/24 that his wounds might not improve.  Daughter does not want SNF or LTAC.  Palliative care following intermittently.  Patient is DNR.   Hypothyroidism  -Continue levothyroxine    Anxiety and adjustment disorder  - Psychiatry consulted on 7/7 and recommended continuing Zoloft .  Psychiatry has signed off.   IDDM  - Continue sliding scale   In agreement with assessment of the pressure ulcer as below:  Pressure Injury 02/26/24 Coccyx Mid Stage 4 - Full thickness tissue loss with exposed bone, tendon or muscle. (Active)  02/26/24 0916  Location: Coccyx  Location Orientation: Mid  Staging: Stage 4 - Full thickness tissue loss with exposed bone, tendon or muscle.  Wound Description (Comments):   DO NOT USE:  Present on Admission: Yes  Dressing Type Foam - Lift dressing to assess site every shift;Silver hydrofiber;Other (Comment) 07/24/24 0500     Pressure Injury 02/26/24 Toe (Comment  which one) Anterior;Right Deep Tissue Pressure Injury - Purple or maroon localized area of discolored intact skin or blood-filled blister due to damage of underlying soft tissue from pressure and/or shear. right gre (Active)  02/26/24 1215  Location: Toe (Comment  which one)  Location Orientation: Anterior;Right  Staging: Deep Tissue Pressure Injury - Purple or maroon localized area of discolored intact skin or blood-filled blister due to damage of underlying soft tissue from pressure and/or shear.  Wound Description (Comments): right great toe  DO NOT USE:  Present on Admission:   Dressing Type None 07/23/24 1600  Pressure Injury 03/06/24 Hip Anterior;Left;Proximal Unstageable - Full thickness tissue loss in which the base of the injury is covered by slough (yellow, tan, gray, green or brown) and/or eschar (tan, brown or black) in the wound bed. unstageable (Active)  03/06/24 1612  Location: Hip  Location Orientation: Anterior;Left;Proximal   Staging: Unstageable - Full thickness tissue loss in which the base of the injury is covered by slough (yellow, tan, gray, green or brown) and/or eschar (tan, brown or black) in the wound bed.  Wound Description (Comments): unstageable  DO NOT USE:  Present on Admission: No  Dressing Type Gauze (Comment) 07/23/24 0800     Pressure Injury 03/06/24 Shoulder Left Unstageable - Full thickness tissue loss in which the base of the injury is covered by slough (yellow, tan, gray, green or brown) and/or eschar (tan, brown or black) in the wound bed. unstageable (Active)  03/06/24 1613  Location: Shoulder  Location Orientation: Left  Staging: Unstageable - Full thickness tissue loss in which the base of the injury is covered by slough (yellow, tan, gray, green or brown) and/or eschar (tan, brown or black) in the wound bed.  Wound Description (Comments): unstageable  DO NOT USE:  Present on Admission: No  Dressing Type Gauze (Comment);Foam - Lift dressing to assess site every shift;Santyl  07/24/24 0500     Pressure Injury 04/23/24 Heel Right Unstageable - Full thickness tissue loss in which the base of the injury is covered by slough (yellow, tan, gray, green or brown) and/or eschar (tan, brown or black) in the wound bed. DTPI evolving into unstageable (Active)  04/23/24   Location: Heel  Location Orientation: Right  Staging: Unstageable - Full thickness tissue loss in which the base of the injury is covered by slough (yellow, tan, gray, green or brown) and/or eschar (tan, brown or black) in the wound bed.  Wound Description (Comments): DTPI evolving into unstageable  DO NOT USE:  Present on Admission: No  Dressing Type Foam - Lift dressing to assess site every shift;Silver dressings 07/24/24 0500     Pressure Injury 04/23/24 Heel Left Deep Tissue Pressure Injury - Purple or maroon localized area of discolored intact skin or blood-filled blister due to damage of underlying soft tissue from pressure  and/or shear. (Active)  04/23/24   Location: Heel  Location Orientation: Left  Staging: Deep Tissue Pressure Injury - Purple or maroon localized area of discolored intact skin or blood-filled blister due to damage of underlying soft tissue from pressure and/or shear.  Wound Description (Comments):   DO NOT USE:  Present on Admission: No  Dressing Type Foam - Lift dressing to assess site every shift 07/24/24 0500     Nutrition Problem: Severe Malnutrition Etiology: chronic illness   DVT prophylaxis:  SCDs Start: 03/04/24 2151 Place TED hose Start: 03/04/24 2151 apixaban  (ELIQUIS ) tablet 5 mg  Code Status: DNR Family Communication: None at bedside Disposition Plan: Disposition barrier  Status is: Inpatient Remains inpatient appropriate because: Difficult disposition     Antimicrobials:  Anti-infectives (From admission, onward)    Start     Dose/Rate Route Frequency Ordered Stop   07/08/24 1045  metroNIDAZOLE  (FLAGYL ) tablet 500 mg        500 mg Oral Every 12 hours 07/08/24 0951 07/12/24 2117   07/08/24 1045  cefTRIAXone  (ROCEPHIN ) 2 g in sodium chloride  0.9 % 100 mL IVPB        2 g 200 mL/hr over 30 Minutes Intravenous Every 24 hours 07/08/24 0951 07/12/24 1249  04/27/24 1615  cefTAZidime  (FORTAZ ) 2 g in sodium chloride  0.9 % 100 mL IVPB        2 g 200 mL/hr over 30 Minutes Intravenous Every 8 hours 04/27/24 1518 05/03/24 2359   03/16/24 1015  cefTRIAXone  (ROCEPHIN ) 2 g in sodium chloride  0.9 % 100 mL IVPB        2 g 200 mL/hr over 30 Minutes Intravenous Every 24 hours 03/16/24 0929 03/20/24 1219   03/16/24 1015  doxycycline  (VIBRA -TABS) tablet 100 mg        100 mg Oral Every 12 hours 03/16/24 0929 03/20/24 2131   03/14/24 1000  metroNIDAZOLE  (FLAGYL ) tablet 500 mg        500 mg Oral Every 12 hours 03/14/24 0803 03/19/24 2219   03/13/24 1000  cefadroxil  (DURICEF) capsule 1,000 mg  Status:  Discontinued        1,000 mg Oral 2 times daily 03/12/24 1632 03/16/24 0929    03/07/24 2200  metroNIDAZOLE  (FLAGYL ) tablet 500 mg        500 mg Oral Every 12 hours 03/07/24 0840 03/11/24 2258   03/05/24 1930  vancomycin  (VANCOREADY) IVPB 1250 mg/250 mL  Status:  Discontinued        1,250 mg 166.7 mL/hr over 90 Minutes Intravenous Every 24 hours 03/04/24 2205 03/04/24 2224   03/05/24 1930  Vancomycin  (VANCOCIN ) 1,250 mg in sodium chloride  0.9 % 250 mL IVPB  Status:  Discontinued        1,250 mg 166.7 mL/hr over 90 Minutes Intravenous Every 24 hours 03/04/24 2224 03/05/24 0717   03/05/24 1930  vancomycin  (VANCOREADY) IVPB 1500 mg/300 mL  Status:  Discontinued        1,500 mg 150 mL/hr over 120 Minutes Intravenous Every 24 hours 03/05/24 0717 03/12/24 1636   03/05/24 1400  ceFEPIme  (MAXIPIME ) 2 g in sodium chloride  0.9 % 100 mL IVPB  Status:  Discontinued        2 g 200 mL/hr over 30 Minutes Intravenous Every 8 hours 03/05/24 0717 03/12/24 1632   03/05/24 0600  ceFEPIme  (MAXIPIME ) 2 g in sodium chloride  0.9 % 100 mL IVPB  Status:  Discontinued        2 g 200 mL/hr over 30 Minutes Intravenous Every 12 hours 03/04/24 2205 03/05/24 0717   03/05/24 0000  metroNIDAZOLE  (FLAGYL ) IVPB 500 mg  Status:  Discontinued        500 mg 100 mL/hr over 60 Minutes Intravenous Every 12 hours 03/04/24 2206 03/04/24 2209   03/04/24 1730  aztreonam (AZACTAM) 2 g in sodium chloride  0.9 % 100 mL IVPB  Status:  Discontinued        2 g 200 mL/hr over 30 Minutes Intravenous  Once 03/04/24 1715 03/04/24 1724   03/04/24 1730  metroNIDAZOLE  (FLAGYL ) IVPB 500 mg  Status:  Discontinued        500 mg 100 mL/hr over 60 Minutes Intravenous Every 12 hours 03/04/24 1715 03/07/24 0840   03/04/24 1730  vancomycin  (VANCOREADY) IVPB 1500 mg/300 mL        1,500 mg 150 mL/hr over 120 Minutes Intravenous  Once 03/04/24 1715 03/04/24 2153   03/04/24 1730  ceFEPIme  (MAXIPIME ) 2 g in sodium chloride  0.9 % 100 mL IVPB        2 g 200 mL/hr over 30 Minutes Intravenous  Once 03/04/24 1724 03/04/24 1826         Objective: Vitals:   07/23/24 2243 07/24/24 0258 07/24/24 0509 07/24/24 0806  BP: 116/70 ROLLEN)  105/59 114/63 105/66  Pulse: 70 64 66 64  Resp:    18  Temp: 97.7 F (36.5 C) 98.1 F (36.7 C) (!) 97.1 F (36.2 C) 97.6 F (36.4 C)  TempSrc:   Oral Oral  SpO2: 100% 100% 98% 96%  Weight:      Height:        Intake/Output Summary (Last 24 hours) at 07/24/2024 1103 Last data filed at 07/24/2024 0900 Gross per 24 hour  Intake 368 ml  Output 650 ml  Net -282 ml   Filed Weights   07/16/24 0500 07/18/24 0205 07/19/24 0500  Weight: 69.4 kg 69.4 kg 69.4 kg    Examination:  General exam: Appears calm and comfortable  Respiratory system: Clear to auscultation. Respiratory effort normal. No respiratory distress. No conversational dyspnea.  Cardiovascular system: S1 & S2 heard, RRR. No murmurs. Gastrointestinal system: Abdomen is nondistended, +edema of flanks  Central nervous system: Alert and oriented Psychiatry: Judgement and insight appear normal. Mood & affect appropriate.   Data Reviewed: I have personally reviewed following labs and imaging studies  CBC: Recent Labs  Lab 07/23/24 0346 07/24/24 0243  WBC 5.6 5.7  NEUTROABS 3.4 3.4  HGB 10.9* 10.6*  HCT 32.0* 31.0*  MCV 86.5 86.8  PLT 271 248   Basic Metabolic Panel: Recent Labs  Lab 07/18/24 0301 07/22/24 0434 07/23/24 0346 07/24/24 0243  NA 142 142 140 141  K 3.9 4.1 4.0 4.1  CL 109 105 102 105  CO2 27 24 24 25   GLUCOSE 120* 119* 144* 135*  BUN 34* 31* 30* 33*  CREATININE 0.94 0.86 0.88 0.80  CALCIUM  8.4* 8.5* 8.5* 8.4*   GFR: Estimated Creatinine Clearance: 68.7 mL/min (by C-G formula based on SCr of 0.8 mg/dL). Liver Function Tests: Recent Labs  Lab 07/22/24 0434  AST 35  ALT 25  ALKPHOS 195*  BILITOT 1.8*  PROT 5.6*  ALBUMIN  2.5*   No results for input(s): LIPASE, AMYLASE in the last 168 hours. No results for input(s): AMMONIA in the last 168 hours. Coagulation Profile: No results  for input(s): INR, PROTIME in the last 168 hours. Cardiac Enzymes: No results for input(s): CKTOTAL, CKMB, CKMBINDEX, TROPONINI in the last 168 hours. BNP (last 3 results) No results for input(s): PROBNP in the last 8760 hours. HbA1C: No results for input(s): HGBA1C in the last 72 hours. CBG: Recent Labs  Lab 07/23/24 0723 07/23/24 1212 07/23/24 1541 07/23/24 2202 07/24/24 0807  GLUCAP 128* 157* 147* 154* 120*   Lipid Profile: No results for input(s): CHOL, HDL, LDLCALC, TRIG, CHOLHDL, LDLDIRECT in the last 72 hours. Thyroid  Function Tests: No results for input(s): TSH, T4TOTAL, FREET4, T3FREE, THYROIDAB in the last 72 hours. Anemia Panel: No results for input(s): VITAMINB12, FOLATE, FERRITIN, TIBC, IRON, RETICCTPCT in the last 72 hours. Sepsis Labs: No results for input(s): PROCALCITON, LATICACIDVEN in the last 168 hours.  No results found for this or any previous visit (from the past 240 hours).    Radiology Studies: No results found.    Scheduled Meds:  (feeding supplement) PROSource Plus  30 mL Oral BID BM   amiodarone   200 mg Oral Daily   apixaban   5 mg Oral BID   collagenase    Topical Daily   diclofenac  Sodium  4 g Topical QID   feeding supplement  237 mL Oral BID BM   ferrous sulfate   325 mg Oral QHS   fluticasone   1 spray Each Nare Daily   furosemide   20 mg Oral Daily  guaiFENesin   600 mg Oral BID   insulin  aspart  0-9 Units Subcutaneous TID WC   levothyroxine   50 mcg Oral Q0600   lidocaine   1 patch Transdermal Q24H   lidocaine -EPINEPHrine  (PF)  30 mL Infiltration Once   liver oil-zinc  oxide   Topical BID   loratadine   10 mg Oral Daily   multivitamin with minerals  1 tablet Oral Daily   neomycin -bacitracin -polymyxin   Apply externally Daily   nutrition supplement (JUVEN)  1 packet Oral BID BM   mouth rinse  15 mL Mouth Rinse 4 times per day   pantoprazole   40 mg Oral Daily   senna-docusate  1 tablet  Oral QHS   sertraline   50 mg Oral Daily   sodium chloride  flush  10-40 mL Intracatheter Q12H   sorbitol   30 mL Oral QHS   Tafamidis   1 capsule Oral Daily   vitamin A   10,000 Units Oral Daily   vutrisiran  sodium  25 mg Subcutaneous Q90 days   Continuous Infusions:  dextrose        LOS: 142 days   Time spent: 25 minutes   Delon Hoe, DO [facility] 07/24/2024, 11:03 AM   Available via Epic secure chat 7am-7pm After these hours, please refer to coverage provider listed on [email or web address]

## 2024-07-24 NOTE — [place] FL2 (Signed)
 [place]  MEDICAID FL2 LEVEL OF CARE FORM     IDENTIFICATION  Patient Name: Earl [name] Birthdate: [birth year] Sex: male Admission Date (Current Location): 03/04/2024  [facility] and [place] Number:  Producer, television/film/video and Address:  [facility] [place], [address], [place], [place] [address]      Provider Number: [id number]  Attending Physician Name and Address:  Rojelio Nest, DO  Relative Name and Phone Number:  Murrell (daughter) [phone]    Current Level of Care: Hospital Recommended Level of Care: Skilled Nursing Facility Prior Approval Number:    Date Approved/Denied:   PASRR Number: [id number]  Discharge Plan: SNF    Current Diagnoses: Patient Active Problem List   Diagnosis Date Noted   Pressure injury of skin of trochanteric region of left hip 07/12/2024   Sacral decubitus ulcer, stage IV (HCC) 06/09/2024   Wild-type transthyretin-related (ATTR) amyloidosis (HCC) 04/29/2024   Protein-calorie malnutrition, severe 03/20/2024   Community acquired pneumonia 03/17/2024   Sepsis (HCC) 03/04/2024   Atrial flutter (HCC) 03/04/2024   GERD (gastroesophageal reflux disease) 03/04/2024   Sacral ulcer (HCC) 03/04/2024   History of hypertension 03/04/2024   Severe sepsis (HCC) 03/04/2024   Atrial fibrillation with RVR (HCC) 03/04/2024   Malnutrition of moderate degree 02/27/2024   Syncope, vasovagal 02/16/2024   COVID-19 virus infection 02/15/2024   Acute metabolic encephalopathy 11/14/2023   Failure to thrive in adult 11/14/2023   Generalized weakness 11/14/2023   Fall at home, initial encounter 11/14/2023   Muscle spasms of both lower extremities 11/14/2023   Pressure ulcer 11/14/2023   Chronic systolic CHF (congestive heart failure) (HCC) 09/11/2022   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   Acute on chronic congestive heart failure (HCC) 07/24/2021   Iron deficiency anemia 07/09/2021   Diabetic mononeuropathy associated with type 2  diabetes mellitus (HCC) 05/17/2021   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   Acquired thrombophilia (HCC) 09/09/2019   Abdominal aortic atherosclerosis (HCC) by Abd CTscvn  2016 09/09/2019   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   Chronic anticoagulation 09/04/2017   Ataxia, post-stroke    History of CVA (cerebrovascular accident)    Hypothyroidism 09/13/2016   Vitamin D  deficiency 04/29/2014   Insulin  dependent type 2 diabetes mellitus (HCC)    BPH (benign prostatic hyperplasia)    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   Chronic anemia 11/07/2013   Essential hypertension 11/07/2013   Hyperlipidemia 11/07/2013   Paroxysmal atrial fibrillation (HCC) 09/18/2012    Orientation RESPIRATION BLADDER Height & Weight     Self, Time, Situation, Place  Normal Incontinent, External catheter Weight: 153 lb (69.4 kg) Height:  5' 11 (180.3 cm)  BEHAVIORAL SYMPTOMS/MOOD NEUROLOGICAL BOWEL NUTRITION STATUS      Incontinent Diet (see DC summary)  AMBULATORY STATUS COMMUNICATION OF NEEDS Skin   Extensive Assist Verbally Other (Comment), PU Stage and Appropriate Care (Blister,Arm,Hip,Shoulder,L,Ecchymosis,arm,Bil.,L,Wound/LDAs,PI hip,ant.,L,toe ant.R,DT,PI shoulder,L,DT,PI coccyx midunstageable,Wound/Incision,open skin tear,elbow,ant.,L,Wound/Incis.open irritant dermatitis MASD sacrum,R,L. Wound 07/22/24)       PU Stage 4 Dressing: Daily (1- Left shoulder Unstageable Cleanse with NS, pat dry, Apply 1/4 thick layer of Santyl  to wound bed, top with saline moist gauze. Top with silicone foam.  Change daily.)               Personal Care Assistance Level of Assistance  Bathing, Feeding, Dressing Bathing Assistance: Maximum assistance Feeding assistance: Maximum assistance Dressing Assistance: Maximum assistance     Functional Limitations Info  Sight, Hearing, Speech  Sight Info: Impaired Hearing Info: Impaired Speech Info: Adequate    SPECIAL  CARE FACTORS FREQUENCY  PT (By licensed PT), OT (By licensed OT)     PT Frequency: 2-5x/week OT Frequency: 2-5x/week            Contractures Contractures Info: Not present    Additional Factors Info  Suctioning Needs, Code Status, Allergies, Insulin  Sliding Scale Code Status Info: DNR Allergies Info: Penicillins   Insulin  Sliding Scale Info: see DC summary Isolation Precautions Info: Covid+     Current Medications (07/24/2024):  This is the current hospital active medication list Current Facility-Administered Medications  Medication Dose Route Frequency Provider Last Rate Last Admin   (feeding supplement) PROSource Plus liquid 30 mL  30 mL Oral BID BM Ogbata, Sylvester I, MD   30 mL at 07/24/24 1634   acetaminophen  (TYLENOL ) tablet 650 mg  650 mg Oral Q6H PRN Gonfa, Taye T, MD   650 mg at 07/24/24 1139   amiodarone  (PACERONE ) tablet 200 mg  200 mg Oral Daily Barbaraann Darryle Ned, MD   200 mg at 07/24/24 9073   apixaban  (ELIQUIS ) tablet 5 mg  5 mg Oral BID Pham, Minh Q, RPH-CPP   5 mg at 07/24/24 9073   bismuth  subsalicylate (PEPTO BISMOL) 262 MG/15ML suspension 30 mL  30 mL Oral Q4H PRN Jillian Buttery, MD   30 mL at 06/23/24 1828   collagenase  (SANTYL ) ointment   Topical Daily Amin, Sumayya, MD   Given at 07/24/24 9072   dextrose  25% IV injection  25 mg Intravenous Once PRN Ghimire, Kuber, MD       diclofenac  Sodium (VOLTAREN ) 1 % topical gel 4 g  4 g Topical QID Akula, Vijaya, MD   4 g at 07/24/24 1400   feeding supplement (ENSURE ENLIVE / ENSURE PLUS) liquid 237 mL  237 mL Oral BID BM Rosario Eland I, MD   237 mL at 07/24/24 1400   ferrous sulfate  tablet 325 mg  325 mg Oral QHS Sundil, Subrina, MD   325 mg at 07/23/24 2149   fluticasone  (FLONASE ) 50 MCG/ACT nasal spray 1 spray  1 spray Each Nare Daily Kathrin Simmer T, MD   1 spray at 07/24/24 9071   food thickener (SIMPLYTHICK (HONEY/LEVEL 3/MODERATELY THICK)) 10 packet  10 packet Oral PRN Akula, Vijaya, MD       furosemide   (LASIX ) tablet 20 mg  20 mg Oral Daily Samtani, Jai-Gurmukh, MD   20 mg at 07/24/24 9073   guaiFENesin  (MUCINEX ) 12 hr tablet 600 mg  600 mg Oral BID Adhikari, Amrit, MD   600 mg at 07/24/24 0926   insulin  aspart (novoLOG ) injection 0-9 Units  0-9 Units Subcutaneous TID WC Gonfa, Taye T, MD   1 Units at 07/24/24 1634   levothyroxine  (SYNTHROID ) tablet 50 mcg  50 mcg Oral Q0600 Sundil, Subrina, MD   50 mcg at 07/24/24 0501   lidocaine  (LIDODERM ) 5 % 1 patch  1 patch Transdermal Q24H Regalado, Belkys A, MD   1 patch at 07/24/24 0924   lip balm (CARMEX) ointment 1 Application  1 Application Topical PRN Jillian Buttery, MD       liver oil-zinc  oxide (DESITIN) 40 % ointment   Topical BID Sundil, Subrina, MD   Given at 07/24/24 9071   loratadine  (CLARITIN ) tablet 10 mg  10 mg Oral Daily Gonfa, Taye T, MD   10 mg at 07/24/24 9074   multivitamin with minerals tablet 1 tablet  1 tablet Oral Daily Odell Castor,  Erle, MD   1 tablet at 07/24/24 9074   naloxone  (NARCAN ) injection 0.4 mg  0.4 mg Intravenous PRN Rathore, Vasundhra, MD       neomycin -bacitracin -polymyxin [phone] OINT   Apply externally Daily Gonfa, Taye T, MD   1 Application at 07/24/24 9070   nutrition supplement (JUVEN) (JUVEN) powder packet 1 packet  1 packet Oral BID BM Ogbata, Sylvester I, MD   1 packet at 07/24/24 1634   ondansetron  (ZOFRAN ) tablet 4 mg  4 mg Oral Q6H PRN Sundil, Subrina, MD       Or   ondansetron  (ZOFRAN ) injection 4 mg  4 mg Intravenous Q6H PRN Sundil, Subrina, MD       Oral care mouth rinse  15 mL Mouth Rinse 4 times per day Gherghe, Costin M, MD   15 mL at 07/24/24 1635   Oral care mouth rinse  15 mL Mouth Rinse PRN Gherghe, Costin M, MD       oxyCODONE  (Oxy IR/ROXICODONE ) immediate release tablet 2.5 mg  2.5 mg Oral Daily PRN Samtani, Jai-Gurmukh, MD   2.5 mg at 07/23/24 1526   pantoprazole  (PROTONIX ) EC tablet 40 mg  40 mg Oral Daily Ogbata, Sylvester I, MD   40 mg at 07/24/24 9073   polyvinyl alcohol   (LIQUIFILM TEARS) 1.4 % ophthalmic solution 1 drop  1 drop Both Eyes PRN Odell Celinda Erle, MD   1 drop at 07/23/24 0841   senna-docusate (Senokot-S) tablet 1 tablet  1 tablet Oral QHS Akula, Vijaya, MD   1 tablet at 07/23/24 2149   sertraline  (ZOLOFT ) tablet 50 mg  50 mg Oral Daily Stevens, Briana N, MD   50 mg at 07/24/24 9074   sodium chloride  flush (NS) 0.9 % injection 10-40 mL  10-40 mL Intracatheter Q12H Darci Pore, MD   10 mL at 07/24/24 0928   sorbitol  70 % solution 30 mL  30 mL Oral QHS Samtani, Jai-Gurmukh, MD   30 mL at 07/23/24 2157   Tafamidis  CAPS 61 mg  1 capsule Oral Daily Jillian Buttery, MD   61 mg at 07/24/24 1138   vitamin A  capsule 10,000 Units  10,000 Units Oral Daily Gonfa, Taye T, MD   10,000 Units at 07/24/24 9071   vutrisiran  (Amvuttra ) 25 mg/0.5 ml syringe - patient's own supply  25 mg Subcutaneous Q90 days Sreeram, Narendranath, MD   25 mg at 04/29/24 2339     Discharge Medications: Please see discharge summary for a list of discharge medications.  Relevant Imaging Results:  Relevant Lab Results:   Additional Information [id number]. Wound Care: 2- Left trochanter: NPWT twice a week dressing done by WOC   3 - Sacral Stage 4 Pressure Injury   Cleanse with Vashe#[id number]. Apply Aquacel Ag+ (silver hydrofiber) Lawson # [id number] in the wound bed and fill the undermining using a swab, cover with sacral foam dressing, change daily and PRN soilage   3- Right heel; Unstageable; Cleanse with NS, pat dry.  Apply  Aquacel Ag+ (silver hydrofiber) Lawson # [id number] and cover with silicone foam dressing. Change daily.   Off load left heel with Prevalon boots at all times.      LALM in place for moisture management and pressure redistribution.  Rice Walsh A [place], LCSW

## 2024-07-24 NOTE — Progress Notes (Addendum)
 Nutrition Follow-up  DOCUMENTATION CODES:   Severe malnutrition in context of chronic illness  INTERVENTION:   -Continue regular diet with thin liquids per pt's daughter's request -Continue 1 packet Juven BID, each packet provides 95 calories, 2.5 grams of protein (collagen), and 9.8 grams of carbohydrate (3 grams sugar); also contains 7 grams of L-arginine and L-glutamine, 300 mg vitamin C , 15 mg vitamin E, 1.2 mcg vitamin B-12, 9.5 mg zinc , 200 mg calcium , and 1.5 g  Calcium  Beta-hydroxy-Beta-methylbutyrate to support wound healing  -Continue mixing 30 ml ProSource Plus BID with Juven, each supplement provides 100 kcals and 15 grams protein.  -Continue Ensure Plus High Protein po BID, each supplement provides 350 kcal and 20 grams of protein  -Continue weekly weights -Continue 10,000 units vitamin A  daily due to chronic deficiency (8.6 on 07/08/24)  -Continue MVI with minerals daily -Continue feeding assistance with meals  NUTRITION DIAGNOSIS:   Severe Malnutrition related to chronic illness as evidenced by energy intake < or equal to 75% for > or equal to 1 month, severe muscle depletion.  Ongoing  GOAL:   Patient will meet greater than or equal to 90% of their needs  Progressing   MONITOR:   PO intake, Supplement acceptance, Weight trends, Skin  REASON FOR ASSESSMENT:   Consult Assessment of nutrition requirement/status  ASSESSMENT:   83 year old male who presented to the ED on 03/04/24 from [facility] due to sacral pain. PMH of T2DM, HTN, HLD, CHF, atrial fibrillation, stroke with right-sided weakness, GERD, BPH, hypothyroidism, MGUS, transthyretin associated amyloid cardiomyopathy, chronic pancytopenia, recent hospital admission for COVID-19. Pt admitted with sepsis secondary to sacral pressure injury infection.  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 7/11- hydrotherapy d/c 7/14- ortho debulked lt hip wound at bedside; s/p lt thoracentesis 7/15- wound vac placed to lt hip by [facility] 7/30- s/p rt thoracentesis (500 ml removed) 8/6- hydrotherapy reinitiated 8/11- US  of abdomen concerning for cholecystitis; HIDA scan ordered 8/12- s/p rt thoracentesis (800 ml removed) 8/13- PT and OT off due to lack of progress 8/19- s/p MBSS- dysphagia 1 diet with nectar thick liquids 8/26- hydrotherapy d/c secondary to wound vac 8/28- s/p MBSS- dysphagia 3 diet with thin liquids  Reviewed I/O's: -300 ml x 24 hours and -7.5 L since 07/10/24  UOP: 650 ml x 24 hours  Pt unavailable at time of visit. Attempted to speak with pt via call to hospital room phone, however, unable to reach. RD unable to obtain further nutrition-related history or complete nutrition-focused physical exam at this time.    Per general surgery notes on 06/25/24, left shoulder, left trochanter, and right heel wounds have an eschar; recommending hydrotherapy for these wounds, can consider surgical debridement if this is not successful.   Per CWOCN notes on 07/22/24, pt with unstageable pressure injury to lt shoulder, stage 4 pressure injury to lt trochanter (being treated with wound vac), unstageable pressure injury to rt heel, and stage 4 pressure injury to sacrum.   Case discussed with RN, who reports pt is doing well and has a good appetite. Pt consumed 75%  of breakfast this morning. Documented meal intake 20-75%. RN reports compliance with supplements. Per yesterday's notes, pt has episodes with  coughing after meals. SLP recommended downgrading to nectar thick liquids, however, daughter prefers ot continue with regular diet. Daughter planned to visit pt last night and feed him dinner.   Palliative care following peripherally for goals of care; plan doe medical optimization and return home to continue managing chronic illnesses. Per TOC notes, pt daughter has refused SNF and LTACH placement and desires pt to remain in the hospital until pt's wounds have improved to at least stage 2. Hospital administration following for discharge disposition.   Reviewed wts; no wt loss over the past month.   Medications reviewed and include ferrous sulfate , protonix , and vitamin A .   Labs reviewed: CBGS: 120-203 (inpatient orders for glycemic control are 0-9 units insulin  aspart TID with meals). Vitamin A : 8.6 (ongoing supplementation).   Diet Order:   Diet Order             Diet regular Room service appropriate? Yes; Fluid consistency: Thin  Diet effective now                   EDUCATION NEEDS:   No education needs have been identified at this time  Skin:  Skin Assessment: Skin Integrity Issues: Skin Integrity Issues:: Unstageable, Stage IV, Wound VAC DTI: - Stage II: - Stage IV: lt trochanter (wound vac placed 07/16/24) Unstageable: lt shoulder, rt heel Wound Vac: lt trochanter Other: -  Last BM:  07/24/24 (type 6)  Height:   Ht Readings from Last 1 Encounters:  03/04/24 5' 11 (1.803 m)    Weight:   Wt Readings from Last 1 Encounters:  07/19/24 69.4 kg    Ideal Body Weight:  78.2 kg  BMI:  Body mass index is 21.34 kg/m.  Estimated Nutritional Needs:   Kcal:  1900-2100  Protein:  100-115g  Fluid:  >/=1.9L    Margery ORN, RD, LDN, CDCES Registered Dietitian III Certified Diabetes Care and Education Specialist If unable to reach this RD, please use RD Inpatient group chat on secure chat between hours of 8am-4 pm daily

## 2024-07-24 NOTE — Progress Notes (Signed)
 Speech Language Pathology Treatment: Dysphagia  Patient Details Name: Earl [name] MRN: [id number] DOB: Dec 13, 1940 Today's Date: 07/24/2024 Time: 8679-8658 SLP Time Calculation (min) (ACUTE ONLY): 21 min  Assessment / Plan / Recommendation Clinical Impression  Met with Para at the bedside. Mr. Rebello was eating lunch.  Addressed concerns re: allowing more choices in diet while still being cautious about consistencies. Para would like her father to stay on a regular diet (opens options for fruit and other vegetables that he loves), but when she is not helping him eat, she is concerned that foods won't be prepared adequately (I.e., chopped or mashed depending on the food item).  She orders all his meals. We discussed ordering specific foods that are softer and that she feels comfortable with him eating in her absence.   He continues to cough intermittently when eating. Reviewed MBS video and discussed the ongoing presence of trace aspiration that will invariably occur. She verbalized understanding of the risk of aspiration and its potential adverse consequences.  She prefers he remain on current diet. Today, he appeared to enjoy his meal - he coughed occasionally; it was strong and productive. His voice was clear and with good volume.    Recommend: continue regular diet, thin liquids.  Pt will occasionally cough with meals. He should be seated in an upright position.  Provide one sip at a time.   SLP will follow and will contact RD for assistance with meal questions.    HPI HPI: Earl [name] is an 83 y.o. 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 He was discharged from the hospital (admitted for acute metabolic encephalopathy, hypotensive, hypoglycemic) to [facility] for rehab. He presented back to the hospital four days later on 03/04/24 with SOB and pain in sacral area. 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 SLP previously saw pt to assess and manage concerns for dysphagia. He was D/Cd on a pureed diet with thin liquids. SLP re-consulted on 05/02/24 for voice assessment; signed off on 8/4 with recs for OP ENT if warranted. New swallowing consult ordered 8/18 due to new onset coughing after eating. MBS 8/19 - intermittent trace aspiration thin liquids; rec thicken to nectar to allow time to recover from acute pna.      SLP Plan  Continue with current plan of care          Recommendations  Diet recommendations: Regular;Thin liquid Liquids provided via: Cup;Straw Medication Administration: Whole meds with puree Supervision: Staff to assist with self feeding Compensations: Small sips/bites;Slow rate Postural Changes and/or Swallow Maneuvers: Seated upright 90 degrees                  Oral care BID   Frequent or constant Supervision/Assistance Dysphagia, oropharyngeal phase (R13.12)     Continue with current plan of care   Moss Berry L. Vona, MA CCC/SLP Clinical Specialist - Acute Care SLP Acute Rehabilitation Services Office number [phone]   Vona Palma Laurice  07/24/2024, 2:17 PM

## 2024-07-25 DIAGNOSIS — R6520 Severe sepsis without septic shock: Secondary | ICD-10-CM | POA: Diagnosis not present

## 2024-07-25 DIAGNOSIS — A419 Sepsis, unspecified organism: Secondary | ICD-10-CM | POA: Diagnosis not present

## 2024-07-25 LAB — GLUCOSE, CAPILLARY
Glucose-Capillary: 180 mg/dL — ABNORMAL HIGH (ref 70–99)
Glucose-Capillary: 160 mg/dL — ABNORMAL HIGH (ref 70–99)
Glucose-Capillary: 143 mg/dL — ABNORMAL HIGH (ref 70–99)
Glucose-Capillary: 72 mg/dL (ref 70–99)

## 2024-07-25 MED ORDER — ATORVASTATIN CALCIUM 10 MG PO TABS
10.0000 mg | ORAL_TABLET | Freq: Every day | ORAL | Status: DC
  Administered 2024-07-26 – 2024-08-21 (×27): 10 mg via ORAL
  Filled 2024-07-25 (×28): qty 1

## 2024-07-25 NOTE — Consult Note (Signed)
 WOC Nurse wound follow up Wound type: Stage 4 pressure injury left trochanter Measurement: 2.5 cm x 2 cm x 4 cm  Wound bed: 90% red, moist 10% yellow Drainage (amount, consistency, odor) serosanguinous in cannister Periwound: intact Dressing procedure/placement/frequency:  Removed old NPWT dressing Cleansed wound with normal saline Periwound skin protected with skin barrier wipe or window framed with drape Skin protected to the hip with VAC drape for foam bridge  Filled wound with   __1__ piece of white foam, _1___ piece of black foam  Sealed NPWT dressing at HG  Patient tolerated procedure well  WOC nurse will continue to provide NPWT dressing changed due to the complexity of the dressing change.    Doyal Polite, RN, MSN, [facility] WOC Team [phone] (Available Mon-Fri 0700-1500)

## 2024-07-25 NOTE — Progress Notes (Signed)
 PROGRESS NOTE    Echo Carducci  FMW:[id number] DOB: [birth year] DOA: 03/04/2024 PCP: [name], MD     Brief Narrative:  Earl [name] is an 83 year old M with PMH of PAF on Eliquis , HFmrEF, amyloid cardiomyopathy, NIDDM-2, CVA with right-sided weakness, stage IV sacral wound, pancytopenia, MGUS, HTN, HLD and hypothyroidism brought to ED by EMS from [facility] rehab due to pain in sacral area and shortness of breath 4 days after he was discharged from the hospital.  Patient was hospitalized from 3/26-4/10 with generalized weakness, encephalopathy and COVID-19 infection.  Per daughter, patient sustained skin injury during this hospitalization, and he had significant pain, discomfort and progressive foul-smelling discharge from sacral wound over the course of 4 to 5 days before coming back to ED. he was admitted due to persistent wounds.  General surgery, orthopedic surgery, ID all consulted.  Significant imaging / results / micro data: 4/14-COVID-19 PCR positive.  Flu and RSV PCR nonreactive 4/14-blood cultures NGTD 6/4-sacral wound culture with Pseudomonas aeruginosa and Klebsiella pneumoniae 7/14-pleural fluid culture NGTD  New events last 24 hours / Subjective: No new events, reports tolerating diet, stable pain. One BM yesterday  Assessment & Plan:   Principal Problem:   Severe sepsis (HCC) Active Problems:   Paroxysmal atrial fibrillation (HCC)   Sepsis (HCC)   Atrial flutter (HCC)   Sacral ulcer (HCC)   MGUS (monoclonal gammopathy of unknown significance)   Essential hypertension   Hyperlipidemia   Insulin  dependent type 2 diabetes mellitus (HCC)   History of CVA (cerebrovascular accident)   Cardiac amyloidosis (HCC)   Chronic systolic CHF (congestive heart failure) (HCC)   GERD (gastroesophageal reflux disease)   History of hypertension   Atrial fibrillation with RVR (HCC)   Community acquired pneumonia   Protein-calorie malnutrition, severe   Wild-type  transthyretin-related (ATTR) amyloidosis (HCC)   Sacral decubitus ulcer, stage IV (HCC)   Pressure injury of skin of trochanteric region of left hip    Stage IV sacral decubitus wound/ulcer with severe sepsis on admission  - Culture data as above - 4/14-CT showed sacral wound extension to the right gluteus muscle without signs of osteomyelitis. - 4/15-underwent bedside debridement of sacral decubitus wound by general surgery - 4/21-ID consulted and recommended continuing broad-spectrum antibiotics that he completed on 6/13. 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 - 6/17- ID recommended continuing holding off antibiotics since he is hemodynamically stable, afebrile without leukocytosis. - 6/17-evaluated by plastic surgery.  Sacral wound felt to be clean.  Wound care recommended - 7/13-7/13-CT showed similar appearance of osteomyelitis underlying sacral decubitus ulcer.  - 7/13-plastic surgery recommended for continued wound care and no surgical management. - 7/14-Ortho evaluated patient for left hip wound.  Debulked and recommended optimizing nutrition - 8/5- Surgery recommended hydrotherapy for left hip wound. Surgical debridement if not successful -8/5-wound care and instruction updated  - 8/15-left hip wound worse with tunneling. Daughter concerned about this.  X-ray with left lateral hip soft tissue defect.  No acute bony abnormality.  -Overall, difficult case due to patient's nutritional status and limited mobility.  Prealbumin <5. - CT scan of the hip 8/15 with new left hip wound which extends deep, 2.8 cm without evidence of acute osteolysis involving the underlying left greater trochanter.  Ortho was reconsulted on 07/12/2024: Recommended conservative management.  Continue wound care as per wound care consult recommendations.   Severe sepsis  - Present on admission.  Resolved.   Aspiration pneumonia  - Patient  with  worsening congestion, increased cough on 8/18.  Chest x-ray 8/18 with lower lobes collapsed and possible underlying pneumonia, likely due to being in bed for prolonged amount of time.  Completed 5 days of Rocephin  and Flagyl  on 07/12/2024. - Diet as per SLP's recommendations   Acute on chronic combined CHF, cardiac amyloidosis, nonischemic cardiomyopathy, hypotension - On tafamidis  and vutrisiran  for amyloid.  Goal-directed medical therapy somewhat limited by prior episodes of hypotension.   - Has received few doses of IV Lasix  recently along with IV albumin .  Currently on oral Lasix . - Continue Tafamidis  and vutrisiran . - Ace wrap for BLE edema - Strict intake and output, daily weights, periodic checks of kidney function and electrolytes   Elevated LFTs  - Lipitor still on hold. Transaminases have normalized. Will re-introduce low dose statin and monitor   Acute on chronic pain - Continue current pain management with lidocaine  patch and as needed oxycodone     PAF - Continue amiodarone  and Eliquis .  Currently rate controlled.  No bleeding   Anemia of chronic disease - From chronic illnesses.  Monitor intermittently.  No signs of bleeding.  Hemoglobin currently stable.   History of CVA - Stable, on Eliquis , holding Lipitor due to LFTs, re-introducing today as above   Concern for acute cholecystitis - Ruled out, right upper quadrant ultrasound initially raise concern, however HIDA scan was negative.  This is likely due to anasarca/third spacing.     Dysphagia/severe protein calorie malnutrition - Continue supplementation, pured diet.  Follow-up SLP and nutrition recommendations   Physical debilitation, muscle weakness - PT/OT ongoing   Goals of care  - Daughter wants him to transition to home once his wounds are better.  Prior hospitalist discussed with daughter on 07/07/24 that his wounds might not improve.  Daughter does not want SNF or LTAC.  Palliative care following  intermittently.  Patient is DNR.   Hypothyroidism  -Continue levothyroxine    Anxiety and adjustment disorder  - Psychiatry consulted on 7/7 and recommended continuing Zoloft .  Psychiatry has signed off.   IDDM  - Continue sliding scale   In agreement with assessment of the pressure ulcer as below:  Pressure Injury 02/26/24 Coccyx Mid Stage 4 - Full thickness tissue loss with exposed bone, tendon or muscle. (Active)  02/26/24 0916  Location: Coccyx  Location Orientation: Mid  Staging: Stage 4 - Full thickness tissue loss with exposed bone, tendon or muscle.  Wound Description (Comments):   DO NOT USE:  Present on Admission: Yes  Dressing Type Foam - Lift dressing to assess site every shift 07/25/24 0900     Pressure Injury 02/26/24 Toe (Comment  which one) Anterior;Right Deep Tissue Pressure Injury - Purple or maroon localized area of discolored intact skin or blood-filled blister due to damage of underlying soft tissue from pressure and/or shear. right gre (Active)  02/26/24 1215  Location: Toe (Comment  which one)  Location Orientation: Anterior;Right  Staging: Deep Tissue Pressure Injury - Purple or maroon localized area of discolored intact skin or blood-filled blister due to damage of underlying soft tissue from pressure and/or shear.  Wound Description (Comments): right great toe  DO NOT USE:  Present on Admission:   Dressing Type None 07/23/24 1600     Pressure Injury 03/06/24 Hip Anterior;Left;Proximal Unstageable - Full thickness tissue loss in which the base of the injury is covered by slough (yellow, tan, gray, green or brown) and/or eschar (tan, brown or black) in the wound bed. unstageable (Active)  03/06/24  1612  Location: Hip  Location Orientation: Anterior;Left;Proximal  Staging: Unstageable - Full thickness tissue loss in which the base of the injury is covered by slough (yellow, tan, gray, green or brown) and/or eschar (tan, brown or black) in the wound bed.   Wound Description (Comments): unstageable  DO NOT USE:  Present on Admission: No  Dressing Type Gauze (Comment) 07/23/24 0800     Pressure Injury 03/06/24 Shoulder Left Unstageable - Full thickness tissue loss in which the base of the injury is covered by slough (yellow, tan, gray, green or brown) and/or eschar (tan, brown or black) in the wound bed. unstageable (Active)  03/06/24 1613  Location: Shoulder  Location Orientation: Left  Staging: Unstageable - Full thickness tissue loss in which the base of the injury is covered by slough (yellow, tan, gray, green or brown) and/or eschar (tan, brown or black) in the wound bed.  Wound Description (Comments): unstageable  DO NOT USE:  Present on Admission: No  Dressing Type Foam - Lift dressing to assess site every shift;Santyl ;Gauze (Comment) 07/25/24 0500     Pressure Injury 04/23/24 Heel Right Unstageable - Full thickness tissue loss in which the base of the injury is covered by slough (yellow, tan, gray, green or brown) and/or eschar (tan, brown or black) in the wound bed. DTPI evolving into unstageable (Active)  04/23/24   Location: Heel  Location Orientation: Right  Staging: Unstageable - Full thickness tissue loss in which the base of the injury is covered by slough (yellow, tan, gray, green or brown) and/or eschar (tan, brown or black) in the wound bed.  Wound Description (Comments): DTPI evolving into unstageable  DO NOT USE:  Present on Admission: No  Dressing Type Silver hydrofiber 07/25/24 0900     Pressure Injury 04/23/24 Heel Left Deep Tissue Pressure Injury - Purple or maroon localized area of discolored intact skin or blood-filled blister due to damage of underlying soft tissue from pressure and/or shear. (Active)  04/23/24   Location: Heel  Location Orientation: Left  Staging: Deep Tissue Pressure Injury - Purple or maroon localized area of discolored intact skin or blood-filled blister due to damage of underlying soft tissue  from pressure and/or shear.  Wound Description (Comments):   DO NOT USE:  Present on Admission: No  Dressing Type Foam - Lift dressing to assess site every shift 07/25/24 0900     Nutrition Problem: Severe Malnutrition Etiology: chronic illness   DVT prophylaxis:  SCDs Start: 03/04/24 2151 Place TED hose Start: 03/04/24 2151 apixaban  (ELIQUIS ) tablet 5 mg  Code Status: DNR Family Communication: None at bedside Disposition Plan: Disposition barrier  Status is: Inpatient Remains inpatient appropriate because: Difficult disposition     Antimicrobials:  Anti-infectives (From admission, onward)    Start     Dose/Rate Route Frequency Ordered Stop   07/08/24 1045  metroNIDAZOLE  (FLAGYL ) tablet 500 mg        500 mg Oral Every 12 hours 07/08/24 0951 07/12/24 2117   07/08/24 1045  cefTRIAXone  (ROCEPHIN ) 2 g in sodium chloride  0.9 % 100 mL IVPB        2 g 200 mL/hr over 30 Minutes Intravenous Every 24 hours 07/08/24 0951 07/12/24 1249   04/27/24 1615  cefTAZidime  (FORTAZ ) 2 g in sodium chloride  0.9 % 100 mL IVPB        2 g 200 mL/hr over 30 Minutes Intravenous Every 8 hours 04/27/24 1518 05/03/24 2359   03/16/24 1015  cefTRIAXone  (ROCEPHIN ) 2 g in sodium chloride   0.9 % 100 mL IVPB        2 g 200 mL/hr over 30 Minutes Intravenous Every 24 hours 03/16/24 0929 03/20/24 1219   03/16/24 1015  doxycycline  (VIBRA -TABS) tablet 100 mg        100 mg Oral Every 12 hours 03/16/24 0929 03/20/24 2131   03/14/24 1000  metroNIDAZOLE  (FLAGYL ) tablet 500 mg        500 mg Oral Every 12 hours 03/14/24 0803 03/19/24 2219   03/13/24 1000  cefadroxil  (DURICEF) capsule 1,000 mg  Status:  Discontinued        1,000 mg Oral 2 times daily 03/12/24 1632 03/16/24 0929   03/07/24 2200  metroNIDAZOLE  (FLAGYL ) tablet 500 mg        500 mg Oral Every 12 hours 03/07/24 0840 03/11/24 2258   03/05/24 1930  vancomycin  (VANCOREADY) IVPB 1250 mg/250 mL  Status:  Discontinued        1,250 mg 166.7 mL/hr over 90 Minutes  Intravenous Every 24 hours 03/04/24 2205 03/04/24 2224   03/05/24 1930  Vancomycin  (VANCOCIN ) 1,250 mg in sodium chloride  0.9 % 250 mL IVPB  Status:  Discontinued        1,250 mg 166.7 mL/hr over 90 Minutes Intravenous Every 24 hours 03/04/24 2224 03/05/24 0717   03/05/24 1930  vancomycin  (VANCOREADY) IVPB 1500 mg/300 mL  Status:  Discontinued        1,500 mg 150 mL/hr over 120 Minutes Intravenous Every 24 hours 03/05/24 0717 03/12/24 1636   03/05/24 1400  ceFEPIme  (MAXIPIME ) 2 g in sodium chloride  0.9 % 100 mL IVPB  Status:  Discontinued        2 g 200 mL/hr over 30 Minutes Intravenous Every 8 hours 03/05/24 0717 03/12/24 1632   03/05/24 0600  ceFEPIme  (MAXIPIME ) 2 g in sodium chloride  0.9 % 100 mL IVPB  Status:  Discontinued        2 g 200 mL/hr over 30 Minutes Intravenous Every 12 hours 03/04/24 2205 03/05/24 0717   03/05/24 0000  metroNIDAZOLE  (FLAGYL ) IVPB 500 mg  Status:  Discontinued        500 mg 100 mL/hr over 60 Minutes Intravenous Every 12 hours 03/04/24 2206 03/04/24 2209   03/04/24 1730  aztreonam (AZACTAM) 2 g in sodium chloride  0.9 % 100 mL IVPB  Status:  Discontinued        2 g 200 mL/hr over 30 Minutes Intravenous  Once 03/04/24 1715 03/04/24 1724   03/04/24 1730  metroNIDAZOLE  (FLAGYL ) IVPB 500 mg  Status:  Discontinued        500 mg 100 mL/hr over 60 Minutes Intravenous Every 12 hours 03/04/24 1715 03/07/24 0840   03/04/24 1730  vancomycin  (VANCOREADY) IVPB 1500 mg/300 mL        1,500 mg 150 mL/hr over 120 Minutes Intravenous  Once 03/04/24 1715 03/04/24 2153   03/04/24 1730  ceFEPIme  (MAXIPIME ) 2 g in sodium chloride  0.9 % 100 mL IVPB        2 g 200 mL/hr over 30 Minutes Intravenous  Once 03/04/24 1724 03/04/24 1826        Objective: Vitals:   07/24/24 2349 07/24/24 2351 07/25/24 0401 07/25/24 0930  BP: (!) 94/58 108/60 103/68 107/65  Pulse: 65  65 66  Resp: 16  16   Temp: 97.8 F (36.6 C)  98.5 F (36.9 C)   TempSrc: Oral  Oral   SpO2:      Weight:       Height:  No intake or output data in the 24 hours ending 07/25/24 1209  Filed Weights   07/16/24 0500 07/18/24 0205 07/19/24 0500  Weight: 69.4 kg 69.4 kg 69.4 kg    Examination:  General exam: Appears calm and comfortable  Respiratory system: Clear to auscultation save for few scattered rhonchi Cardiovascular system: S1 & S2 heard, RRR. No murmurs. Gastrointestinal system: Abdomen is nondistended,  soft Central nervous system: Alert and oriented Psychiatry: Judgement and insight appear normal. Mood & affect appropriate.   Data Reviewed: I have personally reviewed following labs and imaging studies  CBC: Recent Labs  Lab 07/23/24 0346 07/24/24 0243  WBC 5.6 5.7  NEUTROABS 3.4 3.4  HGB 10.9* 10.6*  HCT 32.0* 31.0*  MCV 86.5 86.8  PLT 271 248   Basic Metabolic Panel: Recent Labs  Lab 07/22/24 0434 07/23/24 0346 07/24/24 0243  NA 142 140 141  K 4.1 4.0 4.1  CL 105 102 105  CO2 24 24 25   GLUCOSE 119* 144* 135*  BUN 31* 30* 33*  CREATININE 0.86 0.88 0.80  CALCIUM  8.5* 8.5* 8.4*   GFR: Estimated Creatinine Clearance: 68.7 mL/min (by C-G formula based on SCr of 0.8 mg/dL). Liver Function Tests: Recent Labs  Lab 07/22/24 0434  AST 35  ALT 25  ALKPHOS 195*  BILITOT 1.8*  PROT 5.6*  ALBUMIN  2.5*   No results for input(s): LIPASE, AMYLASE in the last 168 hours. No results for input(s): AMMONIA in the last 168 hours. Coagulation Profile: No results for input(s): INR, PROTIME in the last 168 hours. Cardiac Enzymes: No results for input(s): CKTOTAL, CKMB, CKMBINDEX, TROPONINI in the last 168 hours. BNP (last 3 results) No results for input(s): PROBNP in the last 8760 hours. HbA1C: No results for input(s): HGBA1C in the last 72 hours. CBG: Recent Labs  Lab 07/24/24 0807 07/24/24 1216 07/24/24 1616 07/24/24 2038 07/25/24 1049  GLUCAP 120* 182* 150* 140* 180*   Lipid Profile: No results for input(s): CHOL, HDL, LDLCALC,  TRIG, CHOLHDL, LDLDIRECT in the last 72 hours. Thyroid  Function Tests: No results for input(s): TSH, T4TOTAL, FREET4, T3FREE, THYROIDAB in the last 72 hours. Anemia Panel: No results for input(s): VITAMINB12, FOLATE, FERRITIN, TIBC, IRON, RETICCTPCT in the last 72 hours. Sepsis Labs: No results for input(s): PROCALCITON, LATICACIDVEN in the last 168 hours.  No results found for this or any previous visit (from the past 240 hours).    Radiology Studies: No results found.    Scheduled Meds:  (feeding supplement) PROSource Plus  30 mL Oral BID BM   amiodarone   200 mg Oral Daily   apixaban   5 mg Oral BID   collagenase    Topical Daily   diclofenac  Sodium  4 g Topical QID   feeding supplement  237 mL Oral BID BM   ferrous sulfate   325 mg Oral QHS   fluticasone   1 spray Each Nare Daily   furosemide   20 mg Oral Daily   guaiFENesin   600 mg Oral BID   insulin  aspart  0-9 Units Subcutaneous TID WC   levothyroxine   50 mcg Oral Q0600   lidocaine   1 patch Transdermal Q24H   liver oil-zinc  oxide   Topical BID   loratadine   10 mg Oral Daily   multivitamin with minerals  1 tablet Oral Daily   neomycin -bacitracin -polymyxin   Apply externally Daily   nutrition supplement (JUVEN)  1 packet Oral BID BM   mouth rinse  15 mL Mouth Rinse 4 times per day   pantoprazole   40 mg Oral Daily   senna-docusate  1 tablet Oral QHS   sertraline   50 mg Oral Daily   sodium chloride  flush  10-40 mL Intracatheter Q12H   sorbitol   30 mL Oral QHS   Tafamidis   1 capsule Oral Daily   vitamin A   10,000 Units Oral Daily   vutrisiran  sodium  25 mg Subcutaneous Q90 days   Continuous Infusions:  dextrose        LOS: 143 days   Devaughn KATHEE Ban, MD [facility] 07/25/2024, 12:09 PM   Available via Epic secure chat 7am-7pm After these hours, please refer to coverage provider listed on [email or web address]

## 2024-07-25 NOTE — TOC Progression Note (Signed)
 Transition of Care [facility]) - Progression Note    Patient Details  Name: Earl [name] MRN: [id number] Date of Birth: 1941-01-09  Transition of [facility]) CM/SW Contact  Rosaline JONELLE Joe, RN Phone Number: 07/25/2024, 2:23 PM  Clinical Narrative:    CM with IP Care management met with hospital leadership this morning at 1130 to discuss patient's care needs, transitions of care needs and barriers to discharge.  Leadership team on the meeting included Dr. Kimble,, Dr. Marvine, Risk management, [place], LCSW, and Inpatient Care management leadership including [place], LCSW and [place], [place].  I spoke with [place], IP Care management manager and asked that he reach out to leadership at [facility] to explore option for patient to receive bed offer at the facility since the patient's daughter has declined LTAC and SNF placement.  Marvine, Palliative Care MD plans to follow up with the daughter in the next week or so to continue goals of care discussion.  Hospital leadership with IP Care management will continue to follow to assist with barriers at this time.   Expected Discharge Plan: Skilled Nursing Facility Barriers to Discharge: Continued Medical Work up               Expected Discharge Plan and Services In-house Referral: Clinical Social Work     Living arrangements for the past 2 months:  (from [facility] short term rehab)                                       Social Drivers of Health (SDOH) Interventions SDOH Screenings   Food Insecurity: No Food Insecurity (03/05/2024)  Housing: Low Risk  (03/05/2024)  Transportation Needs: No Transportation Needs (03/05/2024)  Utilities: Not At Risk (03/05/2024)  Depression (PHQ2-9): Low Risk  (10/03/2023)  Financial Resource Strain: Patient Declined (01/16/2024)  Physical Activity: Unknown (01/16/2024)  Social Connections: Moderately Integrated (03/05/2024)  Stress: Stress Concern Present (01/16/2024)  Tobacco Use: Low  Risk  (07/04/2024)    Readmission Risk Interventions    03/19/2024   10:24 AM  Readmission Risk Prevention Plan  Transportation Screening Complete  Medication Review (RN Care Manager) Complete  PCP or Specialist appointment within 3-5 days of discharge Complete  HRI or Home Care Consult Complete  SW Recovery Care/Counseling Consult Complete  Palliative Care Screening Complete  Skilled Nursing Facility Complete

## 2024-07-25 NOTE — TOC Progression Note (Signed)
 Transition of Care [facility]) - Progression Note    Patient Details  Name: Earl [name] MRN: [id number] Date of Birth: 1941-03-31  [facility] CM/SW Contact  Bryndon Cumbie A [place], LCSW Phone Number: 07/25/2024, 4:13 PM  Clinical Narrative:     CSW contacted Tammy at [facility] and she confirmed received referral for pt in the hub. Inpatient Case management made aware.   CSW will continue to follow.   Expected Discharge Plan: Skilled Nursing Facility Barriers to Discharge: Continued Medical Work up               Expected Discharge Plan and Services In-house Referral: Clinical Social Work     Living arrangements for the past 2 months:  (from [facility] short term rehab)                                       Social Drivers of Health (SDOH) Interventions SDOH Screenings   Food Insecurity: No Food Insecurity (03/05/2024)  Housing: Low Risk  (03/05/2024)  Transportation Needs: No Transportation Needs (03/05/2024)  Utilities: Not At Risk (03/05/2024)  Depression (PHQ2-9): Low Risk  (10/03/2023)  Financial Resource Strain: Patient Declined (01/16/2024)  Physical Activity: Unknown (01/16/2024)  Social Connections: Moderately Integrated (03/05/2024)  Stress: Stress Concern Present (01/16/2024)  Tobacco Use: Low Risk  (07/04/2024)    Readmission Risk Interventions    03/19/2024   10:24 AM  Readmission Risk Prevention Plan  Transportation Screening Complete  Medication Review (RN Care Manager) Complete  PCP or Specialist appointment within 3-5 days of discharge Complete  HRI or Home Care Consult Complete  SW Recovery Care/Counseling Consult Complete  Palliative Care Screening Complete  Skilled Nursing Facility Complete

## 2024-07-25 NOTE — Progress Notes (Signed)
 Mobility Specialist: Progress Note   07/25/24 1300  Therapy Vitals  Resp (!) 27  BP 137/79  Patient Position (if appropriate) Lying  Oxygen Therapy  SpO2 100 %  O2 Device Room Air  Mobility  Activity  (UE/LE HEP)  Range of Motion/Exercises Right arm;Left arm;Right leg;Left leg;Active;Active Assistive;Passive  Activity Response Tolerated well  Mobility Referral Yes  Mobility visit 1 Mobility  Mobility Specialist Start Time (ACUTE ONLY) 1133  Mobility Specialist Stop Time (ACUTE ONLY) 1209  Mobility Specialist Time Calculation (min) (ACUTE ONLY) 36 min     Pt received in bed, agreeable to mobility session. Performed UE and LE exercises, tolerated well. Left in bed with all needs met, call bell in reach.    UE (with theraband): chest press, front raises, crossbody punches, overhead press (10-15x each), passive shoulder stretch (3x each arm), assisted sit ups w/ gait belt (10x) LE: bicycles (2 rounds, 40s each), leg lifts (6x each leg)  Earl [name] Mobility Specialist Please contact via SecureChat or Rehab office at [phone]

## 2024-07-25 NOTE — Plan of Care (Signed)
  Problem: Fluid Volume: Goal: Hemodynamic stability will improve Outcome: Progressing   Problem: Clinical Measurements: Goal: Diagnostic test results will improve Outcome: Progressing Goal: Signs and symptoms of infection will decrease Outcome: Progressing   Problem: Education: Goal: Knowledge of risk factors and measures for prevention of condition will improve Outcome: Progressing   Problem: Coping: Goal: Psychosocial and spiritual needs will be supported Outcome: Progressing   Problem: Respiratory: Goal: Will maintain a patent airway Outcome: Progressing Goal: Complications related to the disease process, condition or treatment will be avoided or minimized Outcome: Progressing   Problem: Education: Goal: Ability to describe self-care measures that may prevent or decrease complications (Diabetes Survival Skills Education) will improve Outcome: Progressing   Problem: Coping: Goal: Ability to adjust to condition or change in health will improve Outcome: Progressing   Problem: Fluid Volume: Goal: Ability to maintain a balanced intake and output will improve Outcome: Progressing   Problem: Health Behavior/Discharge Planning: Goal: Ability to manage health-related needs will improve Outcome: Progressing   Problem: Metabolic: Goal: Ability to maintain appropriate glucose levels will improve Outcome: Progressing   Problem: Nutritional: Goal: Maintenance of adequate nutrition will improve Outcome: Progressing Goal: Progress toward achieving an optimal weight will improve Outcome: Progressing   Problem: Skin Integrity: Goal: Risk for impaired skin integrity will decrease Outcome: Progressing   Problem: Tissue Perfusion: Goal: Adequacy of tissue perfusion will improve Outcome: Progressing   Problem: Education: Goal: Knowledge of General Education information will improve Description: Including pain rating scale, medication(s)/side effects and non-pharmacologic  comfort measures Outcome: Progressing   Problem: Health Behavior/Discharge Planning: Goal: Ability to manage health-related needs will improve Outcome: Progressing   Problem: Clinical Measurements: Goal: Ability to maintain clinical measurements within normal limits will improve Outcome: Progressing Goal: Will remain free from infection Outcome: Progressing Goal: Diagnostic test results will improve Outcome: Progressing Goal: Respiratory complications will improve Outcome: Progressing Goal: Cardiovascular complication will be avoided Outcome: Progressing   Problem: Activity: Goal: Risk for activity intolerance will decrease Outcome: Progressing   Problem: Nutrition: Goal: Adequate nutrition will be maintained Outcome: Progressing   Problem: Coping: Goal: Level of anxiety will decrease Outcome: Progressing   Problem: Elimination: Goal: Will not experience complications related to bowel motility Outcome: Progressing Goal: Will not experience complications related to urinary retention Outcome: Progressing   Problem: Pain Managment: Goal: General experience of comfort will improve and/or be controlled Outcome: Progressing   Problem: Safety: Goal: Ability to remain free from injury will improve Outcome: Progressing   Problem: Skin Integrity: Goal: Risk for impaired skin integrity will decrease Outcome: Progressing

## 2024-07-25 NOTE — Plan of Care (Signed)
  Problem: Fluid Volume: Goal: Hemodynamic stability will improve Outcome: Progressing   Problem: Clinical Measurements: Goal: Diagnostic test results will improve Outcome: Progressing Goal: Signs and symptoms of infection will decrease Outcome: Progressing   Problem: Education: Goal: Knowledge of risk factors and measures for prevention of condition will improve Outcome: Progressing   Problem: Coping: Goal: Psychosocial and spiritual needs will be supported Outcome: Progressing   Problem: Respiratory: Goal: Will maintain a patent airway Outcome: Progressing Goal: Complications related to the disease process, condition or treatment will be avoided or minimized Outcome: Progressing   Problem: Education: Goal: Ability to describe self-care measures that may prevent or decrease complications (Diabetes Survival Skills Education) will improve Outcome: Progressing   Problem: Fluid Volume: Goal: Ability to maintain a balanced intake and output will improve Outcome: Progressing   Problem: Health Behavior/Discharge Planning: Goal: Ability to manage health-related needs will improve Outcome: Progressing   Problem: Tissue Perfusion: Goal: Adequacy of tissue perfusion will improve Outcome: Progressing   Problem: Health Behavior/Discharge Planning: Goal: Ability to manage health-related needs will improve Outcome: Progressing   Problem: Clinical Measurements: Goal: Ability to maintain clinical measurements within normal limits will improve Outcome: Progressing Goal: Will remain free from infection Outcome: Progressing Goal: Diagnostic test results will improve Outcome: Progressing Goal: Respiratory complications will improve Outcome: Progressing Goal: Cardiovascular complication will be avoided Outcome: Progressing   Problem: Activity: Goal: Risk for activity intolerance will decrease Outcome: Progressing   Problem: Nutrition: Goal: Adequate nutrition will be  maintained Outcome: Progressing

## 2024-07-26 DIAGNOSIS — A419 Sepsis, unspecified organism: Secondary | ICD-10-CM | POA: Diagnosis not present

## 2024-07-26 DIAGNOSIS — E43 Unspecified severe protein-calorie malnutrition: Secondary | ICD-10-CM | POA: Diagnosis not present

## 2024-07-26 DIAGNOSIS — E854 Organ-limited amyloidosis: Secondary | ICD-10-CM | POA: Diagnosis not present

## 2024-07-26 DIAGNOSIS — R6520 Severe sepsis without septic shock: Secondary | ICD-10-CM | POA: Diagnosis not present

## 2024-07-26 DIAGNOSIS — L89154 Pressure ulcer of sacral region, stage 4: Secondary | ICD-10-CM | POA: Diagnosis not present

## 2024-07-26 DIAGNOSIS — I5022 Chronic systolic (congestive) heart failure: Secondary | ICD-10-CM | POA: Diagnosis not present

## 2024-07-26 LAB — GLUCOSE, CAPILLARY
Glucose-Capillary: 44 mg/dL — CL (ref 70–99)
Glucose-Capillary: 130 mg/dL — ABNORMAL HIGH (ref 70–99)
Glucose-Capillary: 49 mg/dL — ABNORMAL LOW (ref 70–99)
Glucose-Capillary: 73 mg/dL (ref 70–99)
Glucose-Capillary: 139 mg/dL — ABNORMAL HIGH (ref 70–99)
Glucose-Capillary: 120 mg/dL — ABNORMAL HIGH (ref 70–99)

## 2024-07-26 MED ORDER — DEXTROSE 50 % IV SOLN
25.0000 g | INTRAVENOUS | Status: AC
  Administered 2024-07-26: 25 g via INTRAVENOUS
  Filled 2024-07-26: qty 50

## 2024-07-26 MED ORDER — OXYCODONE HCL 5 MG PO TABS
2.5000 mg | ORAL_TABLET | Freq: Three times a day (TID) | ORAL | Status: DC | PRN
  Administered 2024-07-30 – 2024-08-21 (×19): 2.5 mg via ORAL
  Filled 2024-07-26 (×22): qty 1

## 2024-07-26 NOTE — Progress Notes (Signed)
 POC BG HS:  72 (9/4 PM). Pt given 6oz. Orange juice. Pt drank 8oz Powerade during shift. Re-check BG this AM:  44; 2nd BG check: 49. Rathore, MD notified. Pt given D50 IVP as Ordered. Pt in no acute distress/remains A&O x4; answers appropriately. Will continue to monitor.

## 2024-07-26 NOTE — Inpatient Diabetes Management (Signed)
 Inpatient Diabetes Program Recommendations  AACE/ADA: New Consensus Statement on Inpatient Glycemic Control (2015)  Target Ranges:  Prepandial:   less than 140 mg/dL      Peak postprandial:   less than 180 mg/dL (1-2 hours)      Critically ill patients:  140 - 180 mg/dL   Lab Results  Component Value Date   GLUCAP 73 07/26/2024   HGBA1C 8.4 (H) 02/22/2024    Latest Reference Range & Units 07/25/24 22:00 07/26/24 06:28 07/26/24 06:31 07/26/24 07:04 07/26/24 09:33  Glucose-Capillary 70 - 99 mg/dL 72 44 (LL) 49 (L) 869 (H) 73  (LL): Data is critically low (L): Data is abnormally low (H): Data is abnormally high Review of Glycemic Control  Diabetes history: DM2 Outpatient Diabetes medications: Metformin   Current orders for Inpatient glycemic control: Novolog  0-9 units correction scale TID  Inpatient Diabetes Program Recommendations:   Noted that patient had a low CBG of 44 mg/dl this am after getting Novolog  2 units last night for CBG of 143 mg/dl.   Recommend decreasing Novolog  correction scale to 0-6 units TID if blood sugars continue to be low.  Marjorie Lunger RN BSN CDE Diabetes Coordinator Pager: [phone]  8am-5pm

## 2024-07-26 NOTE — Plan of Care (Signed)
  Problem: Respiratory: Goal: Will maintain a patent airway Outcome: Progressing Goal: Complications related to the disease process, condition or treatment will be avoided or minimized Outcome: Progressing   Problem: Fluid Volume: Goal: Ability to maintain a balanced intake and output will improve Outcome: Progressing   Problem: Nutritional: Goal: Maintenance of adequate nutrition will improve Outcome: Progressing Goal: Progress toward achieving an optimal weight will improve Outcome: Progressing   Problem: Skin Integrity: Goal: Risk for impaired skin integrity will decrease Outcome: Progressing

## 2024-07-26 NOTE — Plan of Care (Signed)
  Problem: Coping: Goal: Psychosocial and spiritual needs will be supported Outcome: Progressing   Problem: Respiratory: Goal: Will maintain a patent airway Outcome: Progressing   Problem: Skin Integrity: Goal: Risk for impaired skin integrity will decrease Outcome: Not Progressing

## 2024-07-26 NOTE — Progress Notes (Signed)
 Daily Progress Note   Patient Name: Earl [name]       Date: 07/26/2024 DOB: 04-Dec-1940  Age: 83 y.o. MRN#: [id number] Attending Physician: Elpidio Reyes DEL, MD Primary Care Physician: [name], MD Admit Date: 03/04/2024  Reason for Consultation/Follow-up: Establishing goals of care  Subjective: Medical records reviewed including progress notes, labs, imaging.  Received call from patient's daughter Para today.    I returned her call and we discussed patient's current pain management regimen, with recent change in medication order to only once daily as needed oxycodone .  While he has typically only requested once daily, it would be helpful to have 2 or 3 times daily as needed with possibility of breakthrough pain with dressing changes and other personal care.    We discussed interval history since our last conversation in detail, with emphasis on patient's poor long-term prognosis and continued concerns with documentation in the medical records.  While it is currently documented that patient sustained his sacral wound from 3/26-4/10 hospitalization, there is no clear mention of the other wounds (shoulder, hip, face) also being sustained during this current admission.  She continues to feel that the hospital is responsible for not taking nursing concerns seriously and allowing wounds to progress as badly as they have.  She states that this has sped up his clock.  She first noticed some charting of concerning signs for wounds worsening back in June.  She often references progress notes throughout this hospital stay to keep track of his progression, though she has not been able to see the earliest notes from May anymore.  I offered to reach out to MyChart and see how this can be remedied and she was very appreciative.    Para does  clearly state her understanding of his worsening prognosis the longer that he is hospitalized with several chronic comorbidities, stating that she knows there is not a happy ending though she expects longer than 5 to 6 months.  She also recognizes that he has worsening problems/exacerbations of his chronic illness every few days, which would have been several rehospitalizations if he was at [facility].  She knows that he will require 24/7 care for the rest of his life.  The past few weeks have been especially difficult as it is the anniversary of her mother's death 5 years ago will home with hospice.  Emotional support and therapeutic listening was provided.  We discussed patient's current diet and his increased oral intake, though still coughing.  Discussed hypoglycemia and her questions regarding a protocol for family to be notified about low blood sugars.  We discussed that she likely was not called given that patient was alert and oriented x 4 without symptoms at the time.  I provided education and reassurance regarding her concern for [place] 2 note on 9/3.   Patient assessed at the bedside.  He was resting comfortably and did not arouse in order to preserve his comfort.  Called MyChart regarding the above question and was informed that for older progress notes and a prolonged hospitalization, patients and families will need to contact medical records to request documentation.  Questions and concerns addressed. PMT will continue to support holistically.  Length of Stay:  144   Physical Exam Vitals and nursing note reviewed.  Constitutional:      General: He is not in acute distress.    Appearance: He is ill-appearing.  Cardiovascular:     Rate and Rhythm: Normal rate.  Pulmonary:     Effort: Pulmonary effort is normal.  Skin:    General: Skin is warm and dry.  Neurological:     Mental Status: He is alert. Mental status is at baseline.  Psychiatric:        Behavior: Behavior normal. Behavior  is cooperative.            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    Palliative care evaluated Yadriel earlier this month and have been asked to get re-involved for additional goals of care conversations.   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 Recent COVID infection Severe protein calorie malnutrition  Recommendations/Plan: Continue DNR/DNI Continue current care plan, goals of care remain consistent for medical optimization and return home to continue managing chronic illnesses Patient and daughter understand poor long-term prognosis and have not been ready for comfort care or hospice Adjusted oxycodone  ordered to 2.5 mg 3 times daily as needed for breakthrough pain Called MyChart regarding adult proxy access for older hospital notes; they will need to contact medical records once older progress notes are off MyChart Advocating for patient's daughter's request to speak with primary attending Psychosocial and emotional support provided PMT will continue to follow and support intermittently   Prognosis: Concerning prognosis in light of recurrent  hospitalizations, declining functional/nutritional status, and several chronic comorbidities  Discharge Planning: Home with Palliative Services   Care plan was discussed with patient's daughter, MD    Mickle SHAUNNA Fell, PA-C  Palliative Medicine Team Team phone # [phone]  Thank you for allowing the Palliative Medicine Team to assist in the care of this patient. Please utilize secure chat with additional questions, if there is no response within 30 minutes please call the above phone number.  Palliative Medicine Team providers are available by phone from 7am to 7pm daily and can be reached through the team cell phone.  Should this patient require assistance outside of these hours, please call the patient's attending physician.    Time Total: 65  Visit consisted of counseling and education dealing with the complex and emotionally intense issues of symptom management and palliative care in the setting of serious and potentially life-threatening illness. Greater than 50% of this time was spent counseling and coordinating care related to the above assessment and plan.  Personally spent 65 minutes in patient care including extensive chart review (labs, imaging, progress/consult  notes, vital signs), medically appropraite exam, discussed with treatment team, education to patient, family, and staff, documenting clinical information, medication review and management, coordination of care, and available advanced directive documents.

## 2024-07-26 NOTE — Progress Notes (Signed)
 PROGRESS NOTE  Earl [name]  FMW:[id number] DOB: 1941-02-18 DOA: 03/04/2024 PCP: [name], MD  Consultants  Brief Narrative: Earl [name] is an 83 year old M with PMH of PAF on Eliquis , HFmrEF, amyloid cardiomyopathy, NIDDM-2, CVA with right-sided weakness, stage IV sacral wound, pancytopenia, MGUS, HTN, HLD and hypothyroidism brought to ED by EMS from [facility] rehab due to pain in sacral area and shortness of breath 4 days after he was discharged from the hospital.  Patient was hospitalized from 3/26-4/10 with generalized weakness, encephalopathy and COVID-19 infection.  Per daughter, patient sustained skin injury during this hospitalization, and he had significant pain, discomfort and progressive foul-smelling discharge from sacral wound over the course of 4 to 5 days before coming back to ED. he was admitted due to persistent wounds.  General surgery, orthopedic surgery, ID all consulted.  Significant imaging / results / micro data: 4/14-COVID-19 PCR positive.  Flu and RSV PCR nonreactive 4/14-blood cultures NGTD 6/4-sacral wound culture with Pseudomonas aeruginosa and Klebsiella pneumoniae 7/14-pleural fluid culture NGTD   New events last 24 hours / Subjective: 07/26/24:  No new events, reports tolerating diet, stable pain.   Assessment & Plan:   Principal Problem:   Severe sepsis (HCC) Active Problems:   Paroxysmal atrial fibrillation (HCC)   Sepsis (HCC)   Atrial flutter (HCC)   Sacral ulcer (HCC)   MGUS (monoclonal gammopathy of unknown significance)   Essential hypertension   Hyperlipidemia   Insulin  dependent type 2 diabetes mellitus (HCC)   History of CVA (cerebrovascular accident)   Cardiac amyloidosis (HCC)   Chronic systolic CHF (congestive heart failure) (HCC)   GERD (gastroesophageal reflux disease)   History of hypertension   Atrial fibrillation with RVR (HCC)   Community acquired pneumonia   Protein-calorie malnutrition, severe   Wild-type  transthyretin-related (ATTR) amyloidosis (HCC)   Sacral decubitus ulcer, stage IV (HCC)   Pressure injury of skin of trochanteric region of left hip      Stage IV sacral decubitus wound/ulcer with severe sepsis on admission  - Culture data as above - 4/14-CT showed sacral wound extension to the right gluteus muscle without signs of osteomyelitis. - 4/15-underwent bedside debridement of sacral decubitus wound by general surgery - 4/21-ID consulted and recommended continuing broad-spectrum antibiotics that he completed on 6/13. 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 - 6/17- ID recommended continuing holding off antibiotics since he is hemodynamically stable, afebrile without leukocytosis. - 6/17-evaluated by plastic surgery.  Sacral wound felt to be clean.  Wound care recommended - 7/13-7/13-CT showed similar appearance of osteomyelitis underlying sacral decubitus ulcer.  - 7/13-plastic surgery recommended for continued wound care and no surgical management. - 7/14-Ortho evaluated patient for left hip wound.  Debulked and recommended optimizing nutrition - 8/5- Surgery recommended hydrotherapy for left hip wound. Surgical debridement if not successful -8/5-wound care and instruction updated  - 8/15-left hip wound worse with tunneling. Daughter concerned about this.  X-ray with left lateral hip soft tissue defect.  No acute bony abnormality.  -Overall, difficult case due to patient's nutritional status and limited mobility.  Prealbumin <5. - CT scan of the hip 8/15 with new left hip wound which extends deep, 2.8 cm without evidence of acute osteolysis involving the underlying left greater trochanter.  Ortho was reconsulted on 07/12/2024: Recommended conservative management.  Continue wound care as per wound care consult recommendations.   Severe sepsis  - Present on admission.  Resolved.   Aspiration pneumonia  - Patient  with worsening congestion, increased cough on 8/18.  Chest x-ray 8/18 with lower lobes collapsed and possible underlying pneumonia, likely due to being in bed for prolonged amount of time.  Completed 5 days of Rocephin  and Flagyl  on 07/12/2024. - Diet as per SLP's recommendations   Acute on chronic combined CHF, cardiac amyloidosis, nonischemic cardiomyopathy, hypotension  - On tafamidis  and vutrisiran  for amyloid.  Guideline-directed medical therapy somewhat limited by prior episodes of hypotension.   - Has received few doses of IV Lasix  recently along with IV albumin .  Currently on oral Lasix . - Continue Tafamidis  and vutrisiran . - Ace wrap for BLE edema - Strict intake and output, daily weights, periodic checks of kidney function and electrolytes   Elevated LFTs  - Lipitor still on hold. Transaminases have normalized. Will re-introduce low dose statin and monitor   Acute on chronic pain - Continue current pain management with lidocaine  patch and as needed oxycodone     PAF - Continue amiodarone  and Eliquis .  Currently rate controlled.  No bleeding   Anemia of chronic disease - From chronic illnesses.  Monitor intermittently.  No signs of bleeding.  Hemoglobin currently stable.   History of CVA - Stable, on Eliquis , holding Lipitor due to LFTs, re-introducing today as above   Concern for acute cholecystitis - Ruled out, right upper quadrant ultrasound initially raise concern, however HIDA scan was negative.  This is likely due to anasarca/third spacing.     Dysphagia/severe protein calorie malnutrition - Continue supplementation, pured diet.  Follow-up SLP and nutrition recommendations   Physical debilitation, muscle weakness - PT/OT ongoing   Goals of care  - Daughter wants him to transition to home once his wounds are better.   - more importantly, patient himself also declines SNF (has capacity currently).  - Prior hospitalist discussed with daughter on 07/07/24 that his wounds  might not improve.  Daughter does not want SNF or LTAC.  Palliative care following intermittently.  Patient is DNR.   Hypothyroidism  -Continue levothyroxine    Anxiety and adjustment disorder  - Psychiatry consulted on 7/7 and recommended continuing Zoloft .  Psychiatry has signed off.   IDDM  - Continue sliding scale - notably hypoglycemic last night to 44 earliy this AM.  Corrected with food/glucose.   - not on any long-acting, will follow CBGs.    Nutrition Problem: Severe Malnutrition Etiology: chronic illness Signs/Symptoms: energy intake < or equal to 75% for > or equal to 1 month, severe muscle depletion Interventions: Ensure Enlive (each supplement provides 350kcal and 20 grams of protein), Magic cup, Hormel Shake, MVI, Juven, Other (Comment) (vitamin C ) Pressure Injury 02/26/24 Coccyx Mid Stage 4 - Full thickness tissue loss with exposed bone, tendon or muscle. (Active)  02/26/24 0916  Location: Coccyx  Location Orientation: Mid  Staging: Stage 4 - Full thickness tissue loss with exposed bone, tendon or muscle.  Wound Description (Comments):   DO NOT USE:  Present on Admission: Yes  Dressing Type Foam - Lift dressing to assess site every shift 07/26/24 0500     Pressure Injury 02/26/24 Toe (Comment  which one) Anterior;Right Deep Tissue Pressure Injury - Purple or maroon localized area of discolored intact skin or blood-filled blister due to damage of underlying soft tissue from pressure and/or shear. right gre (Active)  02/26/24 1215  Location: Toe (Comment  which one)  Location Orientation: Anterior;Right  Staging: Deep Tissue Pressure Injury - Purple or maroon localized area of discolored intact skin or blood-filled blister due to damage of  underlying soft tissue from pressure and/or shear.  Wound Description (Comments): right great toe  DO NOT USE:  Present on Admission:   Dressing Type None 07/23/24 1600     Pressure Injury 03/06/24 Hip Anterior;Left;Proximal  Unstageable - Full thickness tissue loss in which the base of the injury is covered by slough (yellow, tan, gray, green or brown) and/or eschar (tan, brown or black) in the wound bed. unstageable (Active)  03/06/24 1612  Location: Hip  Location Orientation: Anterior;Left;Proximal  Staging: Unstageable - Full thickness tissue loss in which the base of the injury is covered by slough (yellow, tan, gray, green or brown) and/or eschar (tan, brown or black) in the wound bed.  Wound Description (Comments): unstageable  DO NOT USE:  Present on Admission: No  Dressing Type -- (wound vac dressing in place) 07/23/24 1600     Pressure Injury 03/06/24 Shoulder Left Unstageable - Full thickness tissue loss in which the base of the injury is covered by slough (yellow, tan, gray, green or brown) and/or eschar (tan, brown or black) in the wound bed. unstageable (Active)  03/06/24 1613  Location: Shoulder  Location Orientation: Left  Staging: Unstageable - Full thickness tissue loss in which the base of the injury is covered by slough (yellow, tan, gray, green or brown) and/or eschar (tan, brown or black) in the wound bed.  Wound Description (Comments): unstageable  DO NOT USE:  Present on Admission: No  Dressing Type Foam - Lift dressing to assess site every shift;Santyl  07/26/24 0500     Pressure Injury 04/23/24 Heel Right Unstageable - Full thickness tissue loss in which the base of the injury is covered by slough (yellow, tan, gray, green or brown) and/or eschar (tan, brown or black) in the wound bed. DTPI evolving into unstageable (Active)  04/23/24   Location: Heel  Location Orientation: Right  Staging: Unstageable - Full thickness tissue loss in which the base of the injury is covered by slough (yellow, tan, gray, green or brown) and/or eschar (tan, brown or black) in the wound bed.  Wound Description (Comments): DTPI evolving into unstageable  DO NOT USE:  Present on Admission: No  Dressing Type Foam  - Lift dressing to assess site every shift 07/26/24 0500     Pressure Injury 04/23/24 Heel Left Deep Tissue Pressure Injury - Purple or maroon localized area of discolored intact skin or blood-filled blister due to damage of underlying soft tissue from pressure and/or shear. (Active)  04/23/24   Location: Heel  Location Orientation: Left  Staging: Deep Tissue Pressure Injury - Purple or maroon localized area of discolored intact skin or blood-filled blister due to damage of underlying soft tissue from pressure and/or shear.  Wound Description (Comments):   DO NOT USE:  Present on Admission: No  Dressing Type Foam - Lift dressing to assess site every shift 07/26/24 0500   DVT prophylaxis:  SCDs Start: 03/04/24 2151 Place TED hose Start: 03/04/24 2151 apixaban  (ELIQUIS ) tablet 5 mg  Code Status:   Code Status: Limited: Do not attempt resuscitation (DNR) -DNR-LIMITED -Do Not Intubate/DNI  Level of care: Telemetry Medical Status is: Inpatient   Subjective: No complaints today.    Objective: Vitals:   07/25/24 2005 07/26/24 0002 07/26/24 0436 07/26/24 0854  BP: 108/67 111/68 116/72 102/76  Pulse: 67 66 64 65  Resp: 16 16  18   Temp: (!) 97.4 F (36.3 C) 97.6 F (36.4 C) 98.1 F (36.7 C) (!) 97.5 F (36.4 C)  TempSrc: Oral Oral  Oral Oral  SpO2: 99% 99% 98% 100%  Weight:      Height:        Intake/Output Summary (Last 24 hours) at 07/26/2024 1106 Last data filed at 07/26/2024 0500 Gross per 24 hour  Intake --  Output 50 ml  Net -50 ml   Filed Weights   07/16/24 0500 07/18/24 0205 07/19/24 0500  Weight: 69.4 kg 69.4 kg 69.4 kg   Body mass index is 21.34 kg/m.  Examination:  General exam: Appears calm and comfortable  Respiratory system: Clear to auscultation save for few scattered rhonchi BL bases  Cardiovascular system: S1 & S2 heard, RRR.  Gastrointestinal system: Abdomen is nondistended,  soft Central nervous system: Alert and oriented Psychiatry: Judgement and  insight appear normal. Mood & affect appropriate.    I have personally reviewed the following labs and images: CBC: Recent Labs  Lab 07/23/24 0346 07/24/24 0243  WBC 5.6 5.7  NEUTROABS 3.4 3.4  HGB 10.9* 10.6*  HCT 32.0* 31.0*  MCV 86.5 86.8  PLT 271 248   BMP &GFR Recent Labs  Lab 07/22/24 0434 07/23/24 0346 07/24/24 0243  NA 142 140 141  K 4.1 4.0 4.1  CL 105 102 105  CO2 24 24 25   GLUCOSE 119* 144* 135*  BUN 31* 30* 33*  CREATININE 0.86 0.88 0.80  CALCIUM  8.5* 8.5* 8.4*   Estimated Creatinine Clearance: 68.7 mL/min (by C-G formula based on SCr of 0.8 mg/dL). Liver & Pancreas: Recent Labs  Lab 07/22/24 0434  AST 35  ALT 25  ALKPHOS 195*  BILITOT 1.8*  PROT 5.6*  ALBUMIN  2.5*   No results for input(s): LIPASE, AMYLASE in the last 168 hours. No results for input(s): AMMONIA in the last 168 hours. Diabetic: No results for input(s): HGBA1C in the last 72 hours. Recent Labs  Lab 07/25/24 2200 07/26/24 0628 07/26/24 0631 07/26/24 0704 07/26/24 0933  GLUCAP 72 44* 49* 130* 73   Cardiac Enzymes: No results for input(s): CKTOTAL, CKMB, CKMBINDEX, TROPONINI in the last 168 hours. No results for input(s): PROBNP in the last 8760 hours. Coagulation Profile: No results for input(s): INR, PROTIME in the last 168 hours. Thyroid  Function Tests: No results for input(s): TSH, T4TOTAL, FREET4, T3FREE, THYROIDAB in the last 72 hours. Lipid Profile: No results for input(s): CHOL, HDL, LDLCALC, TRIG, CHOLHDL, LDLDIRECT in the last 72 hours. Anemia Panel: No results for input(s): VITAMINB12, FOLATE, FERRITIN, TIBC, IRON, RETICCTPCT in the last 72 hours. Urine analysis:    Component Value Date/Time   COLORURINE STRAW (A) 03/04/2024 2139   APPEARANCEUR CLEAR 03/04/2024 2139   LABSPEC 1.016 03/04/2024 2139   PHURINE 6.0 03/04/2024 2139   GLUCOSEU >=500 (A) 03/04/2024 2139   HGBUR SMALL (A) 03/04/2024 2139    BILIRUBINUR NEGATIVE 03/04/2024 2139   KETONESUR NEGATIVE 03/04/2024 2139   PROTEINUR 30 (A) 03/04/2024 2139   UROBILINOGEN 0.2 05/05/2015 1443   NITRITE NEGATIVE 03/04/2024 2139   LEUKOCYTESUR NEGATIVE 03/04/2024 2139   Sepsis Labs: Invalid input(s): PROCALCITONIN, LACTICIDVEN  Microbiology: No results found for this or any previous visit (from the past 240 hours).  Radiology Studies: No results found.  Scheduled Meds:  (feeding supplement) PROSource Plus  30 mL Oral BID BM   amiodarone   200 mg Oral Daily   apixaban   5 mg Oral BID   atorvastatin   10 mg Oral Daily   collagenase    Topical Daily   diclofenac  Sodium  4 g Topical QID   feeding supplement  237 mL Oral  BID BM   ferrous sulfate   325 mg Oral QHS   fluticasone   1 spray Each Nare Daily   furosemide   20 mg Oral Daily   guaiFENesin   600 mg Oral BID   insulin  aspart  0-9 Units Subcutaneous TID WC   levothyroxine   50 mcg Oral Q0600   lidocaine   1 patch Transdermal Q24H   liver oil-zinc  oxide   Topical BID   loratadine   10 mg Oral Daily   multivitamin with minerals  1 tablet Oral Daily   neomycin -bacitracin -polymyxin   Apply externally Daily   nutrition supplement (JUVEN)  1 packet Oral BID BM   mouth rinse  15 mL Mouth Rinse 4 times per day   pantoprazole   40 mg Oral Daily   senna-docusate  1 tablet Oral QHS   sertraline   50 mg Oral Daily   sodium chloride  flush  10-40 mL Intracatheter Q12H   sorbitol   30 mL Oral QHS   Tafamidis   1 capsule Oral Daily   vitamin A   10,000 Units Oral Daily   vutrisiran  sodium  25 mg Subcutaneous Q90 days   Continuous Infusions:   LOS: 144 days   35 minutes with more than 50% spent in reviewing records, counseling patient/family and coordinating care.  Earl VEAR Gaw, MD [facility] [email or web address] 07/26/2024, 11:06 AM

## 2024-07-27 DIAGNOSIS — A419 Sepsis, unspecified organism: Secondary | ICD-10-CM | POA: Diagnosis not present

## 2024-07-27 DIAGNOSIS — R6520 Severe sepsis without septic shock: Secondary | ICD-10-CM | POA: Diagnosis not present

## 2024-07-27 LAB — GLUCOSE, CAPILLARY
Glucose-Capillary: 116 mg/dL — ABNORMAL HIGH (ref 70–99)
Glucose-Capillary: 133 mg/dL — ABNORMAL HIGH (ref 70–99)
Glucose-Capillary: 142 mg/dL — ABNORMAL HIGH (ref 70–99)
Glucose-Capillary: 176 mg/dL — ABNORMAL HIGH (ref 70–99)
Glucose-Capillary: 192 mg/dL — ABNORMAL HIGH (ref 70–99)

## 2024-07-27 NOTE — Progress Notes (Signed)
 PROGRESS NOTE  Earl [name]  FMW:[id number] DOB: [birth year] DOA: 03/04/2024 PCP: [name], MD  Consultants  Brief Narrative: Earl [name] is an 83 year old M with PMH of PAF on Eliquis , HFmrEF, amyloid cardiomyopathy, NIDDM-2, CVA with right-sided weakness, stage IV sacral wound, pancytopenia, MGUS, HTN, HLD and hypothyroidism brought to ED by EMS from [facility] rehab due to pain in sacral area and shortness of breath 4 days after he was discharged from the hospital.  Patient was hospitalized from 3/26-4/10 with generalized weakness, encephalopathy and COVID-19 infection.  Per daughter, patient sustained skin injury during this hospitalization, and he had significant pain, discomfort and progressive foul-smelling discharge from sacral wound over the course of 4 to 5 days before coming back to ED. he was admitted due to persistent wounds.  General surgery, orthopedic surgery, ID, palliative all consulted.  Significant imaging / results / micro data: 4/14-COVID-19 PCR positive.  Flu and RSV PCR nonreactive 4/14-blood cultures NGTD 6/4-sacral wound culture with Pseudomonas aeruginosa and Klebsiella pneumoniae 7/14-pleural fluid culture NGTD   New events last 24 hours / Subjective: 07/26/24:  No new events, reports tolerating diet, stable pain.   Assessment & Plan:   Principal Problem:   Severe sepsis (HCC) Active Problems:   Paroxysmal atrial fibrillation (HCC)   Sepsis (HCC)   Atrial flutter (HCC)   Sacral ulcer (HCC)   MGUS (monoclonal gammopathy of unknown significance)   Essential hypertension   Hyperlipidemia   Insulin  dependent type 2 diabetes mellitus (HCC)   History of CVA (cerebrovascular accident)   Cardiac amyloidosis (HCC)   Chronic systolic CHF (congestive heart failure) (HCC)   GERD (gastroesophageal reflux disease)   History of hypertension   Atrial fibrillation with RVR (HCC)   Community acquired pneumonia   Protein-calorie malnutrition, severe    Wild-type transthyretin-related (ATTR) amyloidosis (HCC)   Sacral decubitus ulcer, stage IV (HCC)   Pressure injury of skin of trochanteric region of left hip    Stage IV sacral decubitus wound/ulcer with severe sepsis on admission  - Culture data as above - 4/14-CT showed sacral wound extension to the right gluteus muscle without signs of osteomyelitis. - 4/15-underwent bedside debridement of sacral decubitus wound by general surgery - 4/21-ID consulted and recommended continuing broad-spectrum antibiotics that he completed on 6/13. 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 - 6/17- ID recommended continuing holding off antibiotics since he is hemodynamically stable, afebrile without leukocytosis. - 6/17-evaluated by plastic surgery.  Sacral wound felt to be clean.  Wound care recommended - 7/13-7/13-CT showed similar appearance of osteomyelitis underlying sacral decubitus ulcer.  - 7/13-plastic surgery recommended for continued wound care and no surgical management. - 7/14-Ortho evaluated patient for left hip wound.  Debulked and recommended optimizing nutrition - 8/5- Surgery recommended hydrotherapy for left hip wound. Surgical debridement if not successful -8/5-wound care and instruction updated  - 8/15-left hip wound worse with tunneling. Daughter concerned about this.  X-ray with left lateral hip soft tissue defect.  No acute bony abnormality.  -Overall, difficult case due to patient's nutritional status and limited mobility.  Prealbumin <5. - CT scan of the hip 8/15 with new left hip wound which extends deep, 2.8 cm without evidence of acute osteolysis involving the underlying left greater trochanter.  Ortho was reconsulted on 07/12/2024: Recommended conservative management.  Continue wound care as per wound care consult recommendations.   Severe sepsis  - Present on admission.  Resolved.   Aspiration pneumonia  -  Patient with  worsening congestion, increased cough on 8/18.  Chest x-ray 8/18 with lower lobes collapsed and possible underlying pneumonia, likely due to being in bed for prolonged amount of time.  Completed 5 days of Rocephin  and Flagyl  on 07/12/2024. - Diet as per SLP's recommendations   Acute on chronic combined CHF, cardiac amyloidosis, nonischemic cardiomyopathy, hypotension  - On tafamidis  and vutrisiran  for amyloid.  Guideline-directed medical therapy somewhat limited by prior episodes of hypotension.   - Has received few doses of IV Lasix  recently along with IV albumin .  Currently on oral Lasix . - Continue Tafamidis  and vutrisiran . - Ace wrap for BLE edema - Strict intake and output, daily weights, periodic checks of kidney function and electrolytes   Elevated LFTs  - Lipitor still on hold. Transaminases have normalized. Will re-introduce low dose statin and monitor   Acute on chronic pain - Continue current pain management with lidocaine  patch and as needed oxycodone   - Greatly appreciate palliative help with this plan.     PAF - Continue amiodarone  and Eliquis .  Currently rate controlled.  No bleeding   Anemia of chronic disease - From chronic illnesses.  Monitor intermittently.  No signs of bleeding.  Hemoglobin currently stable.   History of CVA - Stable, on Eliquis , holding Lipitor due to LFTs, re-introducing today as above   Concern for acute cholecystitis - Ruled out, right upper quadrant ultrasound initially raise concern, however HIDA scan was negative.  This is likely due to anasarca/third spacing.     Dysphagia/severe protein calorie malnutrition - Continue supplementation, pured diet.  Follow-up SLP and nutrition recommendations   Physical debilitation, muscle weakness - PT/OT ongoing   Goals of care  - Daughter wants him to transition to home once his wounds are better.   - more importantly, patient himself also declines SNF (has capacity currently).  -  Prior hospitalist discussed with daughter on 07/07/24 that his wounds might not improve.  Daughter does not want SNF or LTAC.  Palliative care following.  Patient is DNR. GLENWOOD Sermon with daughter by phone yesterday.  She's concerned that majority of his wounds occurred while in hospital, so he should therefore remain in hospital for treatment of said wounds.  - Albumin  remains low, contributing to poor wound healing.  - Overall prognosis remains poor.  Greatly appreciate palliative's input  - Rechecking labs tomorrow.    Hypothyroidism  -Continue levothyroxine    Anxiety and adjustment disorder  - Psychiatry consulted on 7/7 and recommended continuing Zoloft .  Psychiatry has signed off.   IDDM  - Continue sliding scale - notably hypoglycemic last night to 44 earliy this AM.  Corrected with food/glucose.   - not on any long-acting, will follow CBGs.    Nutrition Problem: Severe Malnutrition Etiology: chronic illness Signs/Symptoms: energy intake < or equal to 75% for > or equal to 1 month, severe muscle depletion Interventions: Ensure Enlive (each supplement provides 350kcal and 20 grams of protein), Magic cup, Hormel Shake, MVI, Juven, Other (Comment) (vitamin C ) Pressure Injury 02/26/24 Coccyx Mid Stage 4 - Full thickness tissue loss with exposed bone, tendon or muscle. (Active)  02/26/24 0916  Location: Coccyx  Location Orientation: Mid  Staging: Stage 4 - Full thickness tissue loss with exposed bone, tendon or muscle.  Wound Description (Comments):   DO NOT USE:  Present on Admission: Yes  Dressing Type Foam - Lift dressing to assess site every shift 07/27/24 1300     Pressure Injury 03/06/24 Hip Anterior;Left;Proximal Unstageable - Full thickness tissue loss  in which the base of the injury is covered by slough (yellow, tan, gray, green or brown) and/or eschar (tan, brown or black) in the wound bed. unstageable (Active)  03/06/24 1612  Location: Hip  Location Orientation:  Anterior;Left;Proximal  Staging: Unstageable - Full thickness tissue loss in which the base of the injury is covered by slough (yellow, tan, gray, green or brown) and/or eschar (tan, brown or black) in the wound bed.  Wound Description (Comments): unstageable  DO NOT USE:  Present on Admission: No  Dressing Type Other (Comment) (wound vac) 07/26/24 0846     Pressure Injury 03/06/24 Shoulder Left Unstageable - Full thickness tissue loss in which the base of the injury is covered by slough (yellow, tan, gray, green or brown) and/or eschar (tan, brown or black) in the wound bed. unstageable (Active)  03/06/24 1613  Location: Shoulder  Location Orientation: Left  Staging: Unstageable - Full thickness tissue loss in which the base of the injury is covered by slough (yellow, tan, gray, green or brown) and/or eschar (tan, brown or black) in the wound bed.  Wound Description (Comments): unstageable  DO NOT USE:  Present on Admission: No  Dressing Type Foam - Lift dressing to assess site every shift;Gauze (Comment);Santyl  07/27/24 0500     Pressure Injury 04/23/24 Heel Right Unstageable - Full thickness tissue loss in which the base of the injury is covered by slough (yellow, tan, gray, green or brown) and/or eschar (tan, brown or black) in the wound bed. DTPI evolving into unstageable (Active)  04/23/24   Location: Heel  Location Orientation: Right  Staging: Unstageable - Full thickness tissue loss in which the base of the injury is covered by slough (yellow, tan, gray, green or brown) and/or eschar (tan, brown or black) in the wound bed.  Wound Description (Comments): DTPI evolving into unstageable  DO NOT USE:  Present on Admission: No  Dressing Type Foam - Lift dressing to assess site every shift;Silver hydrofiber 07/27/24 0500     Pressure Injury 04/23/24 Heel Left Deep Tissue Pressure Injury - Purple or maroon localized area of discolored intact skin or blood-filled blister due to damage of  underlying soft tissue from pressure and/or shear. (Active)  04/23/24   Location: Heel  Location Orientation: Left  Staging: Deep Tissue Pressure Injury - Purple or maroon localized area of discolored intact skin or blood-filled blister due to damage of underlying soft tissue from pressure and/or shear.  Wound Description (Comments):   DO NOT USE:  Present on Admission: No  Dressing Type Foam - Lift dressing to assess site every shift 07/27/24 0500   DVT prophylaxis:  SCDs Start: 03/04/24 2151 Place TED hose Start: 03/04/24 2151 apixaban  (ELIQUIS ) tablet 5 mg  Code Status:   Code Status: Limited: Do not attempt resuscitation (DNR) -DNR-LIMITED -Do Not Intubate/DNI  Level of care: Telemetry Medical Status is: Inpatient   Subjective: No complaints today.    Objective: Vitals:   07/27/24 0001 07/27/24 0356 07/27/24 0828 07/27/24 1254  BP: 114/68 98/62 (!) 110/59 116/61  Pulse: 66 63 70 70  Resp: 16 16 18 18   Temp: 97.7 F (36.5 C) 97.9 F (36.6 C) 97.7 F (36.5 C) 97.8 F (36.6 C)  TempSrc: Oral Oral  Oral  SpO2: 99% 99% 99% 95%  Weight:      Height:        Intake/Output Summary (Last 24 hours) at 07/27/2024 1503 Last data filed at 07/27/2024 0945 Gross per 24 hour  Intake 118 ml  Output 800 ml  Net -682 ml   Filed Weights   07/16/24 0500 07/18/24 0205 07/19/24 0500  Weight: 69.4 kg 69.4 kg 69.4 kg   Body mass index is 21.34 kg/m.  Examination:  General exam: Appears calm and comfortable  Respiratory system: Clear to auscultation save for few scattered rhonchi BL bases  Cardiovascular system: S1 & S2 heard, RRR.  Gastrointestinal system: Abdomen is nondistended,  soft Central nervous system: Alert and oriented Psychiatry: Judgement and insight appear normal. Mood & affect appropriate.    I have personally reviewed the following labs and images: CBC: Recent Labs  Lab 07/23/24 0346 07/24/24 0243  WBC 5.6 5.7  NEUTROABS 3.4 3.4  HGB 10.9* 10.6*  HCT 32.0*  31.0*  MCV 86.5 86.8  PLT 271 248   BMP &GFR Recent Labs  Lab 07/22/24 0434 07/23/24 0346 07/24/24 0243  NA 142 140 141  K 4.1 4.0 4.1  CL 105 102 105  CO2 24 24 25   GLUCOSE 119* 144* 135*  BUN 31* 30* 33*  CREATININE 0.86 0.88 0.80  CALCIUM  8.5* 8.5* 8.4*   Estimated Creatinine Clearance: 68.7 mL/min (by C-G formula based on SCr of 0.8 mg/dL). Liver & Pancreas: Recent Labs  Lab 07/22/24 0434  AST 35  ALT 25  ALKPHOS 195*  BILITOT 1.8*  PROT 5.6*  ALBUMIN  2.5*   No results for input(s): LIPASE, AMYLASE in the last 168 hours. No results for input(s): AMMONIA in the last 168 hours. Diabetic: No results for input(s): HGBA1C in the last 72 hours. Recent Labs  Lab 07/26/24 1659 07/27/24 0001 07/27/24 0558 07/27/24 0824 07/27/24 1256  GLUCAP 120* 142* 133* 116* 192*   Cardiac Enzymes: No results for input(s): CKTOTAL, CKMB, CKMBINDEX, TROPONINI in the last 168 hours. No results for input(s): PROBNP in the last 8760 hours. Coagulation Profile: No results for input(s): INR, PROTIME in the last 168 hours. Thyroid  Function Tests: No results for input(s): TSH, T4TOTAL, FREET4, T3FREE, THYROIDAB in the last 72 hours. Lipid Profile: No results for input(s): CHOL, HDL, LDLCALC, TRIG, CHOLHDL, LDLDIRECT in the last 72 hours. Anemia Panel: No results for input(s): VITAMINB12, FOLATE, FERRITIN, TIBC, IRON, RETICCTPCT in the last 72 hours. Urine analysis:    Component Value Date/Time   COLORURINE STRAW (A) 03/04/2024 2139   APPEARANCEUR CLEAR 03/04/2024 2139   LABSPEC 1.016 03/04/2024 2139   PHURINE 6.0 03/04/2024 2139   GLUCOSEU >=500 (A) 03/04/2024 2139   HGBUR SMALL (A) 03/04/2024 2139   BILIRUBINUR NEGATIVE 03/04/2024 2139   KETONESUR NEGATIVE 03/04/2024 2139   PROTEINUR 30 (A) 03/04/2024 2139   UROBILINOGEN 0.2 05/05/2015 1443   NITRITE NEGATIVE 03/04/2024 2139   LEUKOCYTESUR NEGATIVE 03/04/2024 2139    Sepsis Labs: Invalid input(s): PROCALCITONIN, LACTICIDVEN  Microbiology: No results found for this or any previous visit (from the past 240 hours).  Radiology Studies: No results found.  Scheduled Meds:  (feeding supplement) PROSource Plus  30 mL Oral BID BM   amiodarone   200 mg Oral Daily   apixaban   5 mg Oral BID   atorvastatin   10 mg Oral Daily   collagenase    Topical Daily   diclofenac  Sodium  4 g Topical QID   feeding supplement  237 mL Oral BID BM   ferrous sulfate   325 mg Oral QHS   fluticasone   1 spray Each Nare Daily   furosemide   20 mg Oral Daily   guaiFENesin   600 mg Oral BID   insulin  aspart  0-9 Units Subcutaneous TID  WC   levothyroxine   50 mcg Oral Q0600   lidocaine   1 patch Transdermal Q24H   liver oil-zinc  oxide   Topical BID   loratadine   10 mg Oral Daily   multivitamin with minerals  1 tablet Oral Daily   neomycin -bacitracin -polymyxin   Apply externally Daily   nutrition supplement (JUVEN)  1 packet Oral BID BM   mouth rinse  15 mL Mouth Rinse 4 times per day   pantoprazole   40 mg Oral Daily   senna-docusate  1 tablet Oral QHS   sertraline   50 mg Oral Daily   sodium chloride  flush  10-40 mL Intracatheter Q12H   sorbitol   30 mL Oral QHS   Tafamidis   1 capsule Oral Daily   vitamin A   10,000 Units Oral Daily   vutrisiran  sodium  25 mg Subcutaneous Q90 days   Continuous Infusions:   LOS: 145 days   35 minutes with more than 50% spent in reviewing records, counseling patient/family and coordinating care.  Reyes VEAR Gaw, MD [facility] [email or web address] 07/27/2024, 3:03 PM

## 2024-07-27 NOTE — Plan of Care (Signed)
  Problem: Fluid Volume: Goal: Hemodynamic stability will improve Outcome: Progressing   Problem: Clinical Measurements: Goal: Diagnostic test results will improve Outcome: Progressing Goal: Signs and symptoms of infection will decrease Outcome: Progressing   Problem: Education: Goal: Knowledge of risk factors and measures for prevention of condition will improve Outcome: Progressing   Problem: Coping: Goal: Psychosocial and spiritual needs will be supported Outcome: Progressing   Problem: Respiratory: Goal: Will maintain a patent airway Outcome: Progressing Goal: Complications related to the disease process, condition or treatment will be avoided or minimized Outcome: Progressing   Problem: Education: Goal: Ability to describe self-care measures that may prevent or decrease complications (Diabetes Survival Skills Education) will improve Outcome: Progressing   Problem: Coping: Goal: Ability to adjust to condition or change in health will improve Outcome: Progressing   Problem: Fluid Volume: Goal: Ability to maintain a balanced intake and output will improve Outcome: Progressing   Problem: Health Behavior/Discharge Planning: Goal: Ability to manage health-related needs will improve Outcome: Progressing   Problem: Metabolic: Goal: Ability to maintain appropriate glucose levels will improve Outcome: Progressing   Problem: Nutritional: Goal: Maintenance of adequate nutrition will improve Outcome: Progressing Goal: Progress toward achieving an optimal weight will improve Outcome: Progressing   Problem: Skin Integrity: Goal: Risk for impaired skin integrity will decrease Outcome: Progressing   Problem: Tissue Perfusion: Goal: Adequacy of tissue perfusion will improve Outcome: Progressing   Problem: Education: Goal: Knowledge of General Education information will improve Description: Including pain rating scale, medication(s)/side effects and non-pharmacologic  comfort measures Outcome: Progressing   Problem: Health Behavior/Discharge Planning: Goal: Ability to manage health-related needs will improve Outcome: Progressing   Problem: Clinical Measurements: Goal: Ability to maintain clinical measurements within normal limits will improve Outcome: Progressing Goal: Will remain free from infection Outcome: Progressing Goal: Diagnostic test results will improve Outcome: Progressing Goal: Respiratory complications will improve Outcome: Progressing Goal: Cardiovascular complication will be avoided Outcome: Progressing   Problem: Activity: Goal: Risk for activity intolerance will decrease Outcome: Progressing   Problem: Nutrition: Goal: Adequate nutrition will be maintained Outcome: Progressing   Problem: Coping: Goal: Level of anxiety will decrease Outcome: Progressing   Problem: Elimination: Goal: Will not experience complications related to bowel motility Outcome: Progressing Goal: Will not experience complications related to urinary retention Outcome: Progressing   Problem: Pain Managment: Goal: General experience of comfort will improve and/or be controlled Outcome: Progressing   Problem: Safety: Goal: Ability to remain free from injury will improve Outcome: Progressing   Problem: Skin Integrity: Goal: Risk for impaired skin integrity will decrease Outcome: Progressing

## 2024-07-28 DIAGNOSIS — A419 Sepsis, unspecified organism: Secondary | ICD-10-CM | POA: Diagnosis not present

## 2024-07-28 DIAGNOSIS — R6520 Severe sepsis without septic shock: Secondary | ICD-10-CM | POA: Diagnosis not present

## 2024-07-28 LAB — COMPREHENSIVE METABOLIC PANEL WITH GFR
ALT: 23 U/L (ref 0–44)
AST: 34 U/L (ref 15–41)
Albumin: 2.4 g/dL — ABNORMAL LOW (ref 3.5–5.0)
Alkaline Phosphatase: 221 U/L — ABNORMAL HIGH (ref 38–126)
Anion gap: 10 (ref 5–15)
BUN: 32 mg/dL — ABNORMAL HIGH (ref 8–23)
CO2: 24 mmol/L (ref 22–32)
Calcium: 8.5 mg/dL — ABNORMAL LOW (ref 8.9–10.3)
Chloride: 104 mmol/L (ref 98–111)
Creatinine, Ser: 0.92 mg/dL (ref 0.61–1.24)
GFR, Estimated: >60 mL/min (ref >60)
Glucose, Bld: 154 mg/dL — ABNORMAL HIGH (ref 70–99)
Potassium: 4.3 mmol/L (ref 3.5–5.1)
Sodium: 138 mmol/L (ref 135–145)
Total Bilirubin: 2.1 mg/dL — ABNORMAL HIGH (ref 0.0–1.2)
Total Protein: 5.6 g/dL — ABNORMAL LOW (ref 6.5–8.1)

## 2024-07-28 LAB — GLUCOSE, CAPILLARY
Glucose-Capillary: 105 mg/dL — ABNORMAL HIGH (ref 70–99)
Glucose-Capillary: 112 mg/dL — ABNORMAL HIGH (ref 70–99)
Glucose-Capillary: 134 mg/dL — ABNORMAL HIGH (ref 70–99)
Glucose-Capillary: 150 mg/dL — ABNORMAL HIGH (ref 70–99)
Glucose-Capillary: 169 mg/dL — ABNORMAL HIGH (ref 70–99)
Glucose-Capillary: 198 mg/dL — ABNORMAL HIGH (ref 70–99)

## 2024-07-28 LAB — CBC
HCT: 31.7 % — ABNORMAL LOW (ref 39.0–52.0)
Hemoglobin: 10.8 g/dL — ABNORMAL LOW (ref 13.0–17.0)
MCH: 29.8 pg (ref 26.0–34.0)
MCHC: 34.1 g/dL (ref 30.0–36.0)
MCV: 87.6 fL (ref 80.0–100.0)
Platelets: 237 K/uL (ref 150–400)
RBC: 3.62 MIL/uL — ABNORMAL LOW (ref 4.22–5.81)
RDW: 19.9 % — ABNORMAL HIGH (ref 11.5–15.5)
WBC: 6.2 K/uL (ref 4.0–10.5)
nRBC: 0 % (ref 0.0–0.2)

## 2024-07-28 NOTE — Progress Notes (Signed)
 PROGRESS NOTE  Earl [name]  FMW:[id number] DOB: June 23, 1941 DOA: 03/04/2024 PCP: [name], MD  Consultants  Brief Narrative: Earl [name] is an 83 year old M with PMH of PAF on Eliquis , HFmrEF, amyloid cardiomyopathy, NIDDM-2, CVA with right-sided weakness, stage IV sacral wound, pancytopenia, MGUS, HTN, HLD and hypothyroidism brought to ED by EMS from [facility] rehab due to pain in sacral area and shortness of breath 4 days after he was discharged from the hospital.  Patient was hospitalized from 3/26-4/10 with generalized weakness, encephalopathy and COVID-19 infection.  Per daughter, patient sustained skin injury during this hospitalization, and he had significant pain, discomfort and progressive foul-smelling discharge from sacral wound over the course of 4 to 5 days before coming back to ED. he was admitted due to persistent wounds.  General surgery, orthopedic surgery, ID, palliative all consulted.  Significant imaging / results / micro data: 4/14-COVID-19 PCR positive.  Flu and RSV PCR nonreactive 4/14-blood cultures NGTD 6/4-sacral wound culture with Pseudomonas aeruginosa and Klebsiella pneumoniae 7/14-pleural fluid culture NGTD   New events last 24 hours / Subjective: 07/26/24:  No new events, reports tolerating diet, stable pain.   Assessment & Plan:   Principal Problem:   Severe sepsis (HCC) Active Problems:   Paroxysmal atrial fibrillation (HCC)   Sepsis (HCC)   Atrial flutter (HCC)   Sacral ulcer (HCC)   MGUS (monoclonal gammopathy of unknown significance)   Essential hypertension   Hyperlipidemia   Insulin  dependent type 2 diabetes mellitus (HCC)   History of CVA (cerebrovascular accident)   Cardiac amyloidosis (HCC)   Chronic systolic CHF (congestive heart failure) (HCC)   GERD (gastroesophageal reflux disease)   History of hypertension   Atrial fibrillation with RVR (HCC)   Community acquired pneumonia   Protein-calorie malnutrition, severe    Wild-type transthyretin-related (ATTR) amyloidosis (HCC)   Sacral decubitus ulcer, stage IV (HCC)   Pressure injury of skin of trochanteric region of left hip    Stage IV sacral decubitus wound/ulcer with severe sepsis on admission  - Culture data as above - 4/14-CT showed sacral wound extension to the right gluteus muscle without signs of osteomyelitis. - 4/15-underwent bedside debridement of sacral decubitus wound by general surgery - 4/21-ID consulted and recommended continuing broad-spectrum antibiotics that he completed on 6/13. 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 - 6/17- ID recommended continuing holding off antibiotics since he is hemodynamically stable, afebrile without leukocytosis. - 6/17-evaluated by plastic surgery.  Sacral wound felt to be clean.  Wound care recommended - 7/13-7/13-CT showed similar appearance of osteomyelitis underlying sacral decubitus ulcer.  - 7/13-plastic surgery recommended for continued wound care and no surgical management. - 7/14-Ortho evaluated patient for left hip wound.  Debulked and recommended optimizing nutrition - 8/5- Surgery recommended hydrotherapy for left hip wound. Surgical debridement if not successful -8/5-wound care and instruction updated  - 8/15-left hip wound worse with tunneling. Daughter concerned about this.  X-ray with left lateral hip soft tissue defect.  No acute bony abnormality.  -Overall, difficult case due to patient's nutritional status and limited mobility.  Prealbumin <5. - CT scan of the hip 8/15 with new left hip wound which extends deep, 2.8 cm without evidence of acute osteolysis involving the underlying left greater trochanter.  Ortho was reconsulted on 07/12/2024: Recommended conservative management.  Continue wound care as per wound care consult recommendations.   Severe sepsis  - Present on admission.  Resolved.   Aspiration pneumonia  -  Patient with  worsening congestion, increased cough on 8/18.  Chest x-ray 8/18 with lower lobes collapsed and possible underlying pneumonia, likely due to being in bed for prolonged amount of time.  Completed 5 days of Rocephin  and Flagyl  on 07/12/2024. - Diet as per SLP's recommendations   Acute on chronic combined CHF, cardiac amyloidosis, nonischemic cardiomyopathy, hypotension  - On tafamidis  and vutrisiran  for amyloid.  Guideline-directed medical therapy somewhat limited by prior episodes of hypotension.   - Has received few doses of IV Lasix  recently along with IV albumin .  Currently on oral Lasix . - Continue Tafamidis  and vutrisiran . - Ace wrap for BLE edema - Strict intake and output, daily weights, periodic checks of kidney function and electrolytes   Elevated LFTs  - Lipitor still on hold. Transaminases have normalized. Will re-introduce low dose statin and monitor   Acute on chronic pain - Continue current pain management with lidocaine  patch and as needed oxycodone   - Greatly appreciate palliative help with this plan.     PAF - Continue amiodarone  and Eliquis .  Currently rate controlled.  No bleeding   Anemia of chronic disease - From chronic illnesses.  Monitor intermittently.  No signs of bleeding.  Hemoglobin currently stable.   History of CVA - Stable, on Eliquis , holding Lipitor due to LFTs, re-introducing today as above   Concern for acute cholecystitis - Ruled out, right upper quadrant ultrasound initially raise concern, however HIDA scan was negative.  This is likely due to anasarca/third spacing.     Dysphagia/severe protein calorie malnutrition - Continue supplementation, pured diet.  Follow-up SLP and nutrition recommendations   Physical debilitation, muscle weakness - PT/OT ongoing   Goals of care  - Daughter wants him to transition to home once his wounds are better.  Patient himself also declines SNF (has capacity currently).  - Prior hospitalist  discussed with daughter on 07/07/24 that his wounds might not improve.  Daughter does not want SNF or LTAC.  Palliative care following.  Patient is DNR. - Spoke with daughter by phone 9/5.  She's concerned that majority of his wounds occurred while in hospital, so he should therefore remain in hospital for treatment of said wounds.  - Albumin  remains low, contributing to poor wound healing.  - Overall prognosis remains poor.  Greatly appreciate palliative's input  - Labs rechecked today, remain stable.    Hypothyroidism  -Continue levothyroxine    Anxiety and adjustment disorder  - Psychiatry consulted on 7/7 and recommended continuing Zoloft .  Psychiatry has signed off.   IDDM  - Continue sliding scale - notably hypoglycemic last night to 44 earliy this AM.  Corrected with food/glucose.   - not on any long-acting, will follow CBGs.    Nutrition Problem: Severe Malnutrition Etiology: chronic illness Signs/Symptoms: energy intake < or equal to 75% for > or equal to 1 month, severe muscle depletion Interventions: Ensure Enlive (each supplement provides 350kcal and 20 grams of protein), Magic cup, Hormel Shake, MVI, Juven, Other (Comment) (vitamin C ) Pressure Injury 02/26/24 Coccyx Mid Stage 4 - Full thickness tissue loss with exposed bone, tendon or muscle. (Active)  02/26/24 0916  Location: Coccyx  Location Orientation: Mid  Staging: Stage 4 - Full thickness tissue loss with exposed bone, tendon or muscle.  Wound Description (Comments):   DO NOT USE:  Present on Admission: Yes  Dressing Type Foam - Lift dressing to assess site every shift 07/28/24 0400     Pressure Injury 03/06/24 Hip Anterior;Left;Proximal Unstageable - Full thickness tissue loss in which  the base of the injury is covered by slough (yellow, tan, gray, green or brown) and/or eschar (tan, brown or black) in the wound bed. unstageable (Active)  03/06/24 1612  Location: Hip  Location Orientation: Anterior;Left;Proximal   Staging: Unstageable - Full thickness tissue loss in which the base of the injury is covered by slough (yellow, tan, gray, green or brown) and/or eschar (tan, brown or black) in the wound bed.  Wound Description (Comments): unstageable  DO NOT USE:  Present on Admission: No  Dressing Type Foam - Lift dressing to assess site every shift 07/28/24 0400     Pressure Injury 03/06/24 Shoulder Left Unstageable - Full thickness tissue loss in which the base of the injury is covered by slough (yellow, tan, gray, green or brown) and/or eschar (tan, brown or black) in the wound bed. unstageable (Active)  03/06/24 1613  Location: Shoulder  Location Orientation: Left  Staging: Unstageable - Full thickness tissue loss in which the base of the injury is covered by slough (yellow, tan, gray, green or brown) and/or eschar (tan, brown or black) in the wound bed.  Wound Description (Comments): unstageable  DO NOT USE:  Present on Admission: No  Dressing Type Foam - Lift dressing to assess site every shift 07/28/24 0400     Pressure Injury 04/23/24 Heel Right Unstageable - Full thickness tissue loss in which the base of the injury is covered by slough (yellow, tan, gray, green or brown) and/or eschar (tan, brown or black) in the wound bed. DTPI evolving into unstageable (Active)  04/23/24   Location: Heel  Location Orientation: Right  Staging: Unstageable - Full thickness tissue loss in which the base of the injury is covered by slough (yellow, tan, gray, green or brown) and/or eschar (tan, brown or black) in the wound bed.  Wound Description (Comments): DTPI evolving into unstageable  DO NOT USE:  Present on Admission: No  Dressing Type Foam - Lift dressing to assess site every shift 07/28/24 0400     Pressure Injury 04/23/24 Heel Left Deep Tissue Pressure Injury - Purple or maroon localized area of discolored intact skin or blood-filled blister due to damage of underlying soft tissue from pressure and/or  shear. (Active)  04/23/24   Location: Heel  Location Orientation: Left  Staging: Deep Tissue Pressure Injury - Purple or maroon localized area of discolored intact skin or blood-filled blister due to damage of underlying soft tissue from pressure and/or shear.  Wound Description (Comments):   DO NOT USE:  Present on Admission: No  Dressing Type Foam - Lift dressing to assess site every shift 07/28/24 0400   DVT prophylaxis:  SCDs Start: 03/04/24 2151 Place TED hose Start: 03/04/24 2151 apixaban  (ELIQUIS ) tablet 5 mg  Code Status:   Code Status: Limited: Do not attempt resuscitation (DNR) -DNR-LIMITED -Do Not Intubate/DNI  Level of care: Telemetry Medical Status is: Inpatient   Subjective: No complaints today.    Objective: Vitals:   07/27/24 2008 07/27/24 2358 07/28/24 0409 07/28/24 0817  BP: 103/66 114/66 (!) 101/56 108/66  Pulse: 67 71 65 98  Resp: 15 16 16 20   Temp: 97.8 F (36.6 C) 98 F (36.7 C) 97.8 F (36.6 C) 98.3 F (36.8 C)  TempSrc: Oral Oral Oral Oral  SpO2: 100% 100% 99% 99%  Weight:      Height:        Intake/Output Summary (Last 24 hours) at 07/28/2024 1621 Last data filed at 07/28/2024 0935 Gross per 24 hour  Intake 123  ml  Output 1150 ml  Net -1027 ml   Filed Weights   07/16/24 0500 07/18/24 0205 07/19/24 0500  Weight: 69.4 kg 69.4 kg 69.4 kg   Body mass index is 21.34 kg/m.  Examination:  General exam: Appears calm and comfortable.  Lying in hospital bed, thin, frail-appearing.    Respiratory system: Clear to auscultation save for few scattered rhonchi BL bases  Cardiovascular system: S1 & S2 heard, RRR.  Gastrointestinal system: Abdomen is nondistended,  soft Ext:  No LE edema.  PRAFO boots in place BL. Central nervous system: Alert and oriented Psychiatry: Judgement and insight appear normal. Mood & affect appropriate.   I have personally reviewed the following labs and images: CBC: Recent Labs  Lab 07/23/24 0346 07/24/24 0243  07/28/24 0202  WBC 5.6 5.7 6.2  NEUTROABS 3.4 3.4  --   HGB 10.9* 10.6* 10.8*  HCT 32.0* 31.0* 31.7*  MCV 86.5 86.8 87.6  PLT 271 248 237   BMP &GFR Recent Labs  Lab 07/22/24 0434 07/23/24 0346 07/24/24 0243 07/28/24 0202  NA 142 140 141 138  K 4.1 4.0 4.1 4.3  CL 105 102 105 104  CO2 24 24 25 24   GLUCOSE 119* 144* 135* 154*  BUN 31* 30* 33* 32*  CREATININE 0.86 0.88 0.80 0.92  CALCIUM  8.5* 8.5* 8.4* 8.5*   Estimated Creatinine Clearance: 59.7 mL/min (by C-G formula based on SCr of 0.92 mg/dL). Liver & Pancreas: Recent Labs  Lab 07/22/24 0434 07/28/24 0202  AST 35 34  ALT 25 23  ALKPHOS 195* 221*  BILITOT 1.8* 2.1*  PROT 5.6* 5.6*  ALBUMIN  2.5* 2.4*   No results for input(s): LIPASE, AMYLASE in the last 168 hours. No results for input(s): AMMONIA in the last 168 hours. Diabetic: No results for input(s): HGBA1C in the last 72 hours. Recent Labs  Lab 07/27/24 1644 07/28/24 0008 07/28/24 0608 07/28/24 0826 07/28/24 1209  GLUCAP 176* 198* 105* 112* 169*   Cardiac Enzymes: No results for input(s): CKTOTAL, CKMB, CKMBINDEX, TROPONINI in the last 168 hours. No results for input(s): PROBNP in the last 8760 hours. Coagulation Profile: No results for input(s): INR, PROTIME in the last 168 hours. Thyroid  Function Tests: No results for input(s): TSH, T4TOTAL, FREET4, T3FREE, THYROIDAB in the last 72 hours. Lipid Profile: No results for input(s): CHOL, HDL, LDLCALC, TRIG, CHOLHDL, LDLDIRECT in the last 72 hours. Anemia Panel: No results for input(s): VITAMINB12, FOLATE, FERRITIN, TIBC, IRON, RETICCTPCT in the last 72 hours. Urine analysis:    Component Value Date/Time   COLORURINE STRAW (A) 03/04/2024 2139   APPEARANCEUR CLEAR 03/04/2024 2139   LABSPEC 1.016 03/04/2024 2139   PHURINE 6.0 03/04/2024 2139   GLUCOSEU >=500 (A) 03/04/2024 2139   HGBUR SMALL (A) 03/04/2024 2139   BILIRUBINUR NEGATIVE  03/04/2024 2139   KETONESUR NEGATIVE 03/04/2024 2139   PROTEINUR 30 (A) 03/04/2024 2139   UROBILINOGEN 0.2 05/05/2015 1443   NITRITE NEGATIVE 03/04/2024 2139   LEUKOCYTESUR NEGATIVE 03/04/2024 2139   Sepsis Labs: Invalid input(s): PROCALCITONIN, LACTICIDVEN  Microbiology: No results found for this or any previous visit (from the past 240 hours).  Radiology Studies: No results found.  Scheduled Meds:  (feeding supplement) PROSource Plus  30 mL Oral BID BM   amiodarone   200 mg Oral Daily   apixaban   5 mg Oral BID   atorvastatin   10 mg Oral Daily   collagenase    Topical Daily   diclofenac  Sodium  4 g Topical QID   feeding supplement  237 mL Oral BID BM   ferrous sulfate   325 mg Oral QHS   fluticasone   1 spray Each Nare Daily   furosemide   20 mg Oral Daily   guaiFENesin   600 mg Oral BID   insulin  aspart  0-9 Units Subcutaneous TID WC   levothyroxine   50 mcg Oral Q0600   lidocaine   1 patch Transdermal Q24H   liver oil-zinc  oxide   Topical BID   loratadine   10 mg Oral Daily   multivitamin with minerals  1 tablet Oral Daily   neomycin -bacitracin -polymyxin   Apply externally Daily   nutrition supplement (JUVEN)  1 packet Oral BID BM   mouth rinse  15 mL Mouth Rinse 4 times per day   pantoprazole   40 mg Oral Daily   senna-docusate  1 tablet Oral QHS   sertraline   50 mg Oral Daily   sodium chloride  flush  10-40 mL Intracatheter Q12H   sorbitol   30 mL Oral QHS   Tafamidis   1 capsule Oral Daily   vitamin A   10,000 Units Oral Daily   vutrisiran  sodium  25 mg Subcutaneous Q90 days   Continuous Infusions:   LOS: 146 days   35 minutes with more than 50% spent in reviewing records, counseling patient/family and coordinating care.  Reyes VEAR Gaw, MD [facility] [email or web address] 07/28/2024, 4:21 PM

## 2024-07-29 DIAGNOSIS — A419 Sepsis, unspecified organism: Secondary | ICD-10-CM | POA: Diagnosis not present

## 2024-07-29 DIAGNOSIS — R6520 Severe sepsis without septic shock: Secondary | ICD-10-CM | POA: Diagnosis not present

## 2024-07-29 DIAGNOSIS — L89220 Pressure ulcer of left hip, unstageable: Secondary | ICD-10-CM

## 2024-07-29 DIAGNOSIS — E854 Organ-limited amyloidosis: Secondary | ICD-10-CM | POA: Diagnosis not present

## 2024-07-29 DIAGNOSIS — E43 Unspecified severe protein-calorie malnutrition: Secondary | ICD-10-CM | POA: Diagnosis not present

## 2024-07-29 DIAGNOSIS — I4891 Unspecified atrial fibrillation: Secondary | ICD-10-CM | POA: Diagnosis not present

## 2024-07-29 LAB — GLUCOSE, CAPILLARY
Glucose-Capillary: 102 mg/dL — ABNORMAL HIGH (ref 70–99)
Glucose-Capillary: 141 mg/dL — ABNORMAL HIGH (ref 70–99)
Glucose-Capillary: 136 mg/dL — ABNORMAL HIGH (ref 70–99)
Glucose-Capillary: 132 mg/dL — ABNORMAL HIGH (ref 70–99)

## 2024-07-29 MED ORDER — FERROUS SULFATE 325 (65 FE) MG PO TABS
325.0000 mg | ORAL_TABLET | Freq: Every day | ORAL | Status: DC
Start: 2024-07-29 — End: 2024-08-01
  Administered 2024-07-29 – 2024-07-31 (×3): 325 mg via ORAL
  Filled 2024-07-29 (×3): qty 1

## 2024-07-29 MED ORDER — SENNOSIDES-DOCUSATE SODIUM 8.6-50 MG PO TABS
1.0000 | ORAL_TABLET | Freq: Every day | ORAL | Status: DC
  Administered 2024-07-29 – 2024-07-31 (×3): 1 via ORAL
  Filled 2024-07-29 (×3): qty 1

## 2024-07-29 NOTE — Progress Notes (Signed)
 Daily Progress Note   Patient Name: Earl [name]       Date: 07/29/2024 DOB: Oct 05, 1941  Age: 83 y.o. MRN#: [id number] Attending Physician: Raenelle Coria, MD Primary Care Physician: [name], MD Admit Date: 03/04/2024  Reason for Consultation/Follow-up: Establishing goals of care  Subjective: Medical records reviewed including progress notes, labs, imaging.  Received call from patient's daughter Para today. Patient was resting at time of my visit today, appears fatigued.  We discussed her questions regarding change in time of patient's evening medications. It is not clear when or why they were moved from 8pm to 10pm. This would be better to allow for patient's rest. We also discussed patient's poor prognosis and while she understands, she also feels that this would ideally be discussed with him when she is present for support given the difficult nature of the topic. Reassurance was provided. Relayed request for a call to patient's primary attending.  Discussed [place] FL2 again - it is still not clear how this works and why certain information is listed on it. I relayed her request to review this to LCSW.  Discussed questions about change in diet order over the weekend. Patient would prefer regular diet with allowance of soft food, which was the plan when last discussed with SLP. She is not sure why this has been downgraded. Relayed her concern to SLP.   Questions and concerns addressed. PMT will continue to support holistically.  Length of Stay: 147   Physical Exam Vitals and nursing note reviewed.  Constitutional:      General: He is not in acute distress.    Appearance: He is ill-appearing.  Cardiovascular:     Rate and Rhythm: Normal rate.  Pulmonary:     Effort: Pulmonary effort is normal.  Skin:    General: Skin is  warm and dry.  Neurological:     Mental Status: He is alert. Mental status is at baseline.  Psychiatric:        Behavior: Behavior normal. Behavior is cooperative.            Vital Signs: BP 108/65 (BP Location: Left Arm)   Pulse 71   Temp 98 F (36.7 C)   Resp 16   Ht 5' 11 (1.803 m)   Wt 69.4 kg   SpO2 98%   BMI 21.34 kg/m  SpO2: SpO2: 98 % O2 Device: O2 Device: Room Air O2 Flow Rate:        Palliative Assessment/Data: 20% to 30%   Palliative Care Assessment & Plan   Patient Profile: Per intake H&P --> Patient is a 83 year old male with history of paroxysmal A-fib, insulin -dependent diabetes, hypertension, hyperlipidemia, CVA with residual right-sided weakness, MGUS, GERD, BPH, hypothyroidism, HFrEF with EF of 40 to 45%, chronic pancytopenia who presented to the emergency department from SNF with complaint of shortness of breath, wound, pain in the sacral area.    Palliative care evaluated [place] earlier this month and have been asked to get re-involved for additional goals of care conversations.   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 Recent COVID infection Severe protein calorie malnutrition  Recommendations/Plan: Continue DNR/DNI Continue current care plan, goals of care remain consistent for medical optimization and return home to continue managing chronic illnesses Patient and daughter understand poor long-term prognosis and have not been ready for comfort care or hospice Advocating for patient's daughter's request to speak with primary attending, LCSW, and SLP Psychosocial and emotional support provided PMT will continue to follow and support intermittently   Prognosis: Concerning prognosis in light of recurrent hospitalizations, declining functional/nutritional status, and several chronic  comorbidities  Discharge Planning: Home with Palliative Services   Care plan was discussed with patient's daughter, MD, SLP, LCSW    Briella Hobday SHAUNNA Fell, PA-C  Palliative Medicine Team Team phone # [phone]  Thank you for allowing the Palliative Medicine Team to assist in the care of this patient. Please utilize secure chat with additional questions, if there is no response within 30 minutes please call the above phone number.  Palliative Medicine Team providers are available by phone from 7am to 7pm daily and can be reached through the team cell phone.  Should this patient require assistance outside of these hours, please call the patient's attending physician.    Time Total: 35  Visit consisted of counseling and education dealing with the complex and emotionally intense issues of symptom management and palliative care in the setting of serious and potentially life-threatening illness. Greater than 50% of this time was spent counseling and coordinating care related to the above assessment and plan.  Personally spent 35 minutes in patient care including extensive chart review (labs, imaging, progress/consult notes, vital signs), medically appropraite exam, discussed with treatment team, education to patient, family, and staff, documenting clinical information, medication review and management, coordination of care, and available advanced directive documents.

## 2024-07-29 NOTE — Progress Notes (Addendum)
 Speech Language Pathology Treatment: Dysphagia  Patient Details Name: Terique Kawabata MRN: [id number] DOB: 02/04/1941 Today's Date: 07/29/2024 Time: 8794-8783 SLP Time Calculation (min) (ACUTE ONLY): 11 min  Assessment / Plan / Recommendation Clinical Impression  Pt's diet was changed back to pureed from regular over the weekend.  There has been confusion over which diet is best for this gentleman. With slow, careful hand-feeding and positioning in an upright position, Mr. Raisanen has done well with a regular diet with his daughter, Para, selecting softer foods from the menu.    Please refer to 9/3 SLP progress note: He coughs intermittently when eating. His daughter verbalized understanding of potential aspiration and its risks.   During our session today he demonstrated adequate toleration of thin liquids; he self-fed from a straw with supervision. He declined solid foods.  Spoke with Para on the phone today re: above.  Spoke with RD, Mallory, who will f/u this week with Para re: specific nutrition questions. Per RD, pt's intake was much better when his diet was advanced to regular solids. Sent message to Dr. Raenelle re: diet - will advance back to regular solids, thin liquids.  Mr. Al WILL COUGH INTERMITTENTLY WHEN EATING MEALS.     No further SLP f/u is warranted. Our service will sign off.      HPI HPI: Yuma Mchale is an 83 y.o. 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 He was discharged from the hospital (admitted for acute metabolic encephalopathy, hypotensive, hypoglycemic) to [facility] for rehab. He presented back to the hospital four days later on 03/04/24 with SOB and pain in sacral area. He was admitted with sepsis secondary to sacral pressure wound/chronic pressure ulcer of sacral area stage III, chronic sacral wound with suspicion of infection, Severe protein calorie  malnutrition. SLP previously saw pt to assess and manage concerns for dysphagia. He was D/Cd on a pureed diet with thin liquids. SLP re-consulted on 05/02/24 for voice assessment; signed off on 8/4 with recs for OP ENT if warranted. New swallowing consult ordered 8/18 due to new onset coughing after eating. MBS 8/19 - intermittent trace aspiration thin liquids; rec thicken to nectar to allow time to recover from acute pna.      SLP Plan  No further SLP f/u is indicated.          Recommendations  Regular diet, thin liquids Liquids provided via: Cup;Straw Medication Administration: Whole meds with puree Supervision: Staff to assist with self feeding Compensations: Small sips/bites;Slow rate Postural Changes and/or Swallow Maneuvers: Seated upright 90 degrees                  Oral care BID   Frequent or constant Supervision/Assistance Dysphagia, oropharyngeal phase (R13.12)          Ronetta Molla Laurice Daleyza Gadomski L. [place], [place] CCC/SLP Clinical Specialist - Acute Care SLP Acute Rehabilitation Services Office number [phone]   07/29/2024, 2:32 PM

## 2024-07-29 NOTE — Consult Note (Addendum)
 WOC Nurse wound follow up Wound type: Unstageable pressure injury left shoulder Stage 4 pressure injury: left trochanter Unstageable Pressure Injury; right heel Stage 4 Pressure Injury; sacrum Unstageable pressure injury to L buttocks Measurement: L shoulder: 1 cm x 1 cm  Left trochanter: 3 cm x 2.5 cm x 3 cm with 1.5 cm undermining from 1 o'clock to 4 o'clock Right heel: 3 cm x 3 cm Sacrum: 6 cm x 4 cmx 0.5 cm with 2 cm undermining from 12 o'clock to 3 o'clock L buttocks: 2 cm x 1cm Wound bed: L shoulder: 80% loose yellow slough, 20 % pink, red tissue Left trochanter: 90% red, moist, 10% yellow Right heel: 100% covered with black eschar Sacrum: 50% red, moist, 50% scattered yellow slough L buttocks: 70% pink, moist, 30% yellow Drainage (amount, consistency, odor)  L shoulder: serosanguinous Left trochanter: serosanguinous in cannister Right heel: serosanguinous Sacrum: serosanguinous to purulent L buttocks: Serosanguinous  Periwound: intact Dressing procedure/placement/frequency: Dressing procedure/placement/frequency: 1- Left shoulder Unstageable  Cleanse with NS, pat dry, Apply 1/4 thick layer of Santyl  to wound bed, top with saline moist gauze. Top with silicone foam.  Change daily. 2- Left trochanter: NPWT WOC will change 2x weekly 3 - Sacral/ L buttocks: Cleanse with NS. Apply 1/4 thick layer of Santyl  to wound bed, top with saline moist gauze. Top with silicone foam. Change daily.  3- Right heel; Unstageable; Cleanse with NS, pat dry.  Apply  Aquacel Ag+ (silver hydrofiber) Lawson # [id number] and cover with silicone foam dressing. Change daily.  Off load bilateral heels with Prevalon boots at all times.  LALM in place for moisture management and pressure redistribution.  Left trochanter: Removed old NPWT dressing to trochanter  Cleansed wound with normal saline  Filled wound with   __1__ piece of black foam  Sealed NPWT dressing at HG  Patient tolerated  procedure well  WOC nurse will continue to provide NPWT dressing changed due to the complexity of the dressing change.    Plan of care discussed with daughter, Para Mcevoy, via telephone.  WOC Nurse team will follow with you and see patient within 10 days for wound assessments.  Please notify WOC nurses of any acute changes in the wounds or any new areas of concern  Doyal Polite, RN, MSN, [facility] WOC Team [phone] (Available Mon-Fri 0700-1500)

## 2024-07-29 NOTE — Progress Notes (Signed)
 PROGRESS NOTE  Earl [name]  FMW:[id number] DOB: [birth year] DOA: 03/04/2024 PCP: [name], MD  Consultants  Brief Narrative: Earl [name] is an 83 year old M with PMH of PAF on Eliquis , HFmrEF, amyloid cardiomyopathy, NIDDM-2, CVA with right-sided weakness, stage IV sacral wound, pancytopenia, MGUS, HTN, HLD and hypothyroidism brought to ED by EMS from [facility] rehab due to pain in sacral area and shortness of breath 4 days after he was discharged from the hospital.  Patient was hospitalized from 3/26-4/10 with generalized weakness, encephalopathy and COVID-19 infection.  Per daughter, patient sustained skin injury during this hospitalization, and he had significant pain, discomfort and progressive foul-smelling discharge from sacral wound over the course of 4 to 5 days before coming back to ED. he was admitted due to persistent wounds.  General surgery, orthopedic surgery, ID, palliative all consulted.  Patient is in the hospital for prolonged time.  This note is created on the basis of previous documentation and events along with present events.  Significant imaging / results / micro data: 4/14-COVID-19 PCR positive.  Flu and RSV PCR nonreactive 4/14-blood cultures NGTD 6/4-sacral wound culture with Pseudomonas aeruginosa and Klebsiella pneumoniae 7/14-pleural fluid culture NGTD   New events last 24 hours / Subjective: Patient seen and examined.  Nursing has no concerns.  Patient tells me that he eats good these days.  He had complaints of some swelling on the right groin which is edematous with some thickened areas of the skin.  Otherwise denies any complaints.  Pain is controlled.  No family at the bedside on my exam.  Assessment & Plan:   Principal Problem:   Severe sepsis (HCC) Active Problems:   Paroxysmal atrial fibrillation (HCC)   Sepsis (HCC)   Atrial flutter (HCC)   Sacral ulcer (HCC)   MGUS (monoclonal gammopathy of unknown significance)   Essential  hypertension   Hyperlipidemia   Insulin  dependent type 2 diabetes mellitus (HCC)   History of CVA (cerebrovascular accident)   Cardiac amyloidosis (HCC)   Chronic systolic CHF (congestive heart failure) (HCC)   GERD (gastroesophageal reflux disease)   History of hypertension   Atrial fibrillation with RVR (HCC)   Community acquired pneumonia   Protein-calorie malnutrition, severe   Wild-type transthyretin-related (ATTR) amyloidosis (HCC)   Sacral decubitus ulcer, stage IV (HCC)   Pressure injury of skin of trochanteric region of left hip    Stage IV sacral decubitus wound/ulcer with severe sepsis on admission: Treated as below. - Culture data as above - 4/14-CT showed sacral wound extension to the right gluteus muscle without signs of osteomyelitis. - 4/15-underwent bedside debridement of sacral decubitus wound by general surgery - 4/21-ID consulted and recommended continuing broad-spectrum antibiotics that he completed on 6/13. 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 - 6/17- ID recommended continuing holding off antibiotics since he is hemodynamically stable, afebrile without leukocytosis. - 6/17-evaluated by plastic surgery.  Sacral wound felt to be clean.  Wound care recommended - 7/13-7/13-CT showed similar appearance of osteomyelitis underlying sacral decubitus ulcer.  - 7/13-plastic surgery recommended for continued wound care and no surgical management. - 7/14-Ortho evaluated patient for left hip wound.  Debulked and recommended optimizing nutrition - 8/5- Surgery recommended hydrotherapy for left hip wound. Surgical debridement if not successful - 8/5-wound care and instruction updated  - 8/15-left hip wound worse with tunneling. X-ray with left lateral hip soft tissue defect.  No acute bony abnormality.  -Overall, difficult case due to patient's nutritional status and limited  mobility.  Prealbumin <5. - CT  scan of the hip 8/15 with new left hip wound which extends deep, 2.8 cm without evidence of acute osteolysis involving the underlying left greater trochanter.  Ortho was reconsulted on 07/12/2024: Recommended conservative management.  Continue wound care as per wound care consult recommendations. --Updated pictures in the chart available.   Severe sepsis  - Present on admission.  Resolved.   Aspiration pneumonia  - Patient with worsening congestion, increased cough on 8/18.  Chest x-ray 8/18 with lower lobes collapsed and possible underlying pneumonia, likely due to being in bed for prolonged amount of time.  Completed 5 days of Rocephin  and Flagyl  on 07/12/2024. - Diet as per SLP's recommendations   Acute on chronic combined CHF, cardiac amyloidosis, nonischemic cardiomyopathy, hypotension  - On tafamidis  and vutrisiran  for amyloid.  Guideline-directed medical therapy somewhat limited by prior episodes of hypotension.   - Has received few doses of IV Lasix  recently along with IV albumin .  Currently on oral Lasix . - Continue Tafamidis  and vutrisiran . - Ace wrap for BLE edema - periodic checks of kidney function and electrolytes   Elevated LFTs  - Lipitor reintroduced.  Will check LFTs intermittently.  Stable now.     Acute on chronic pain - Continue current pain management with lidocaine  patch and as needed oxycodone   - Greatly appreciate palliative help with this plan.     PAF - Continue amiodarone  and Eliquis .  Currently rate controlled.  No bleeding   Anemia of chronic disease - From chronic illnesses.  Monitor intermittently.  No signs of bleeding.  Hemoglobin currently stable.   History of CVA - Stable, on Eliquis , back on Lipitor 10.   Concern for acute cholecystitis - Ruled out, right upper quadrant ultrasound initially raise concern, however HIDA scan was negative.  This is likely due to anasarca/third spacing.     Dysphagia/severe protein calorie malnutrition - Continue  supplementation, pured diet.  Follow-up SLP and nutrition recommendations   Physical debilitation, muscle weakness - PT/OT ongoing.  Challenging.   Goals of care  - Daughter wants him to transition to home once his wounds are better.  Patient himself also declines SNF (has capacity currently).  - Daughter does not want SNF or LTAC.  Palliative care following.  Patient is DNR. - Albumin  remains low, contributing to poor wound healing.  - Overall prognosis remains poor.  Greatly appreciate palliative's input  - Labs rechecked,  remain stable.    Hypothyroidism  -Continue levothyroxine    Anxiety and adjustment disorder  - Psychiatry consulted on 7/7 and recommended continuing Zoloft .  Psychiatry has signed off.   IDDM  - Continue sliding scale.  Hypoglycemic episode 9/5 night.  On sliding scale insulin .  No more on long-acting insulin .  Encourage nutrition.    DVT prophylaxis:  SCDs Start: 03/04/24 2151 Place TED hose Start: 03/04/24 2151 apixaban  (ELIQUIS ) tablet 5 mg  Code Status:   Code Status: Limited: Do not attempt resuscitation (DNR) -DNR-LIMITED -Do Not Intubate/DNI  Level of care: Telemetry Medical Status is: Inpatient   Objective: Vitals:   07/28/24 2004 07/28/24 2341 07/29/24 0436 07/29/24 0852  BP: 125/70 110/64 114/62 123/67  Pulse: 70 67 66 71  Resp: 16 16    Temp: 98.2 F (36.8 C) (!) 97.5 F (36.4 C) 97.7 F (36.5 C) 98 F (36.7 C)  TempSrc: Oral Oral Oral   SpO2: 100% 98% 97% 99%  Weight:      Height:  Intake/Output Summary (Last 24 hours) at 07/29/2024 1055 Last data filed at 07/29/2024 0900 Gross per 24 hour  Intake 330 ml  Output 700 ml  Net -370 ml   Filed Weights   07/16/24 0500 07/18/24 0205 07/19/24 0500  Weight: 69.4 kg 69.4 kg 69.4 kg   Body mass index is 21.34 kg/m.  Examination:   General: Chronically sick looking.  Very frail and debilitated gentleman laying in the bed.  On room air. Cardiovascular: S1-S2 normal.  Regular  rate rhythm. Respiratory: On room air.  He has conducted upper airway sounds.  Poor inspiratory effort. Gastrointestinal: Soft.  Nontender. Ext: Anasarca present.  He also has diffuse subcutaneous swelling and edema mostly on the right upper hip, groin. Neuro: Alert and awake.  Gross generalized weakness. Multiple pressure ulcers as guided by wound care.  Also pictures in the chart.   I have personally reviewed the following labs and images: CBC: Recent Labs  Lab 07/23/24 0346 07/24/24 0243 07/28/24 0202  WBC 5.6 5.7 6.2  NEUTROABS 3.4 3.4  --   HGB 10.9* 10.6* 10.8*  HCT 32.0* 31.0* 31.7*  MCV 86.5 86.8 87.6  PLT 271 248 237   BMP &GFR Recent Labs  Lab 07/23/24 0346 07/24/24 0243 07/28/24 0202  NA 140 141 138  K 4.0 4.1 4.3  CL 102 105 104  CO2 24 25 24   GLUCOSE 144* 135* 154*  BUN 30* 33* 32*  CREATININE 0.88 0.80 0.92  CALCIUM  8.5* 8.4* 8.5*   Estimated Creatinine Clearance: 59.7 mL/min (by C-G formula based on SCr of 0.92 mg/dL). Liver & Pancreas: Recent Labs  Lab 07/28/24 0202  AST 34  ALT 23  ALKPHOS 221*  BILITOT 2.1*  PROT 5.6*  ALBUMIN  2.4*   No results for input(s): LIPASE, AMYLASE in the last 168 hours. No results for input(s): AMMONIA in the last 168 hours. Diabetic: No results for input(s): HGBA1C in the last 72 hours. Recent Labs  Lab 07/28/24 0826 07/28/24 1209 07/28/24 1659 07/28/24 2112 07/29/24 0851  GLUCAP 112* 169* 150* 134* 102*   Cardiac Enzymes: No results for input(s): CKTOTAL, CKMB, CKMBINDEX, TROPONINI in the last 168 hours. No results for input(s): PROBNP in the last 8760 hours. Coagulation Profile: No results for input(s): INR, PROTIME in the last 168 hours. Thyroid  Function Tests: No results for input(s): TSH, T4TOTAL, FREET4, T3FREE, THYROIDAB in the last 72 hours. Lipid Profile: No results for input(s): CHOL, HDL, LDLCALC, TRIG, CHOLHDL, LDLDIRECT in the last 72  hours. Anemia Panel: No results for input(s): VITAMINB12, FOLATE, FERRITIN, TIBC, IRON, RETICCTPCT in the last 72 hours. Urine analysis:    Component Value Date/Time   COLORURINE STRAW (A) 03/04/2024 2139   APPEARANCEUR CLEAR 03/04/2024 2139   LABSPEC 1.016 03/04/2024 2139   PHURINE 6.0 03/04/2024 2139   GLUCOSEU >=500 (A) 03/04/2024 2139   HGBUR SMALL (A) 03/04/2024 2139   BILIRUBINUR NEGATIVE 03/04/2024 2139   KETONESUR NEGATIVE 03/04/2024 2139   PROTEINUR 30 (A) 03/04/2024 2139   UROBILINOGEN 0.2 05/05/2015 1443   NITRITE NEGATIVE 03/04/2024 2139   LEUKOCYTESUR NEGATIVE 03/04/2024 2139   Sepsis Labs: Invalid input(s): PROCALCITONIN, LACTICIDVEN  Microbiology: No results found for this or any previous visit (from the past 240 hours).  Radiology Studies: No results found.  Scheduled Meds:  (feeding supplement) PROSource Plus  30 mL Oral BID BM   amiodarone   200 mg Oral Daily   apixaban   5 mg Oral BID   atorvastatin   10 mg Oral Daily  collagenase    Topical Daily   diclofenac  Sodium  4 g Topical QID   feeding supplement  237 mL Oral BID BM   ferrous sulfate   325 mg Oral QHS   fluticasone   1 spray Each Nare Daily   furosemide   20 mg Oral Daily   guaiFENesin   600 mg Oral BID   insulin  aspart  0-9 Units Subcutaneous TID WC   levothyroxine   50 mcg Oral Q0600   lidocaine   1 patch Transdermal Q24H   liver oil-zinc  oxide   Topical BID   loratadine   10 mg Oral Daily   multivitamin with minerals  1 tablet Oral Daily   neomycin -bacitracin -polymyxin   Apply externally Daily   nutrition supplement (JUVEN)  1 packet Oral BID BM   mouth rinse  15 mL Mouth Rinse 4 times per day   pantoprazole   40 mg Oral Daily   senna-docusate  1 tablet Oral QHS   sertraline   50 mg Oral Daily   sodium chloride  flush  10-40 mL Intracatheter Q12H   sorbitol   30 mL Oral QHS   Tafamidis   1 capsule Oral Daily   vitamin A   10,000 Units Oral Daily   vutrisiran  sodium  25 mg  Subcutaneous Q90 days   Continuous Infusions:   LOS: 147 days   35 minutes with more than 50% spent in reviewing records, counseling patient/family and coordinating care.  Renato Applebaum, MD [facility] [email or web address] 07/29/2024, 10:55 AM

## 2024-07-29 NOTE — Plan of Care (Signed)
  Problem: Fluid Volume: Goal: Hemodynamic stability will improve Outcome: Progressing   Problem: Clinical Measurements: Goal: Diagnostic test results will improve Outcome: Progressing Goal: Signs and symptoms of infection will decrease Outcome: Progressing   Problem: Education: Goal: Knowledge of risk factors and measures for prevention of condition will improve Outcome: Progressing   Problem: Coping: Goal: Psychosocial and spiritual needs will be supported Outcome: Progressing   Problem: Respiratory: Goal: Will maintain a patent airway Outcome: Progressing Goal: Complications related to the disease process, condition or treatment will be avoided or minimized Outcome: Progressing   Problem: Education: Goal: Ability to describe self-care measures that may prevent or decrease complications (Diabetes Survival Skills Education) will improve Outcome: Progressing   Problem: Coping: Goal: Ability to adjust to condition or change in health will improve Outcome: Progressing   Problem: Fluid Volume: Goal: Ability to maintain a balanced intake and output will improve Outcome: Progressing   Problem: Health Behavior/Discharge Planning: Goal: Ability to manage health-related needs will improve Outcome: Progressing   Problem: Metabolic: Goal: Ability to maintain appropriate glucose levels will improve Outcome: Progressing   Problem: Nutritional: Goal: Maintenance of adequate nutrition will improve Outcome: Progressing Goal: Progress toward achieving an optimal weight will improve Outcome: Progressing   Problem: Skin Integrity: Goal: Risk for impaired skin integrity will decrease Outcome: Progressing   Problem: Tissue Perfusion: Goal: Adequacy of tissue perfusion will improve Outcome: Progressing   Problem: Education: Goal: Knowledge of General Education information will improve Description: Including pain rating scale, medication(s)/side effects and non-pharmacologic  comfort measures Outcome: Progressing   Problem: Health Behavior/Discharge Planning: Goal: Ability to manage health-related needs will improve Outcome: Progressing   Problem: Clinical Measurements: Goal: Ability to maintain clinical measurements within normal limits will improve Outcome: Progressing Goal: Will remain free from infection Outcome: Progressing Goal: Diagnostic test results will improve Outcome: Progressing Goal: Respiratory complications will improve Outcome: Progressing Goal: Cardiovascular complication will be avoided Outcome: Progressing   Problem: Activity: Goal: Risk for activity intolerance will decrease Outcome: Progressing   Problem: Nutrition: Goal: Adequate nutrition will be maintained Outcome: Progressing   Problem: Coping: Goal: Level of anxiety will decrease Outcome: Progressing   Problem: Elimination: Goal: Will not experience complications related to bowel motility Outcome: Progressing Goal: Will not experience complications related to urinary retention Outcome: Progressing   Problem: Pain Managment: Goal: General experience of comfort will improve and/or be controlled Outcome: Progressing   Problem: Safety: Goal: Ability to remain free from injury will improve Outcome: Progressing   Problem: Skin Integrity: Goal: Risk for impaired skin integrity will decrease Outcome: Progressing

## 2024-07-29 NOTE — TOC Progression Note (Signed)
 Transition of Care [facility]) - Progression Note    Patient Details  Name: Earl [name] MRN: [id number] Date of Birth: Oct 02, 1941  [facility] CM/SW Contact  Raymar Joiner A [place], LCSW Phone Number: 07/29/2024, 4:30 PM  Clinical Narrative:     CSW was notified by Palliative care team that pt's daughter Earl [name] requested information regarding pt's FL2 form. CSW made 2 attempts to speak with daughter at bedside, unable to due to not being in room the first time and pt was receiving care during second time.  CSW will follow up at another more opportune time.   CSW will continue to follow.     Expected Discharge Plan: Skilled Nursing Facility Barriers to Discharge: Continued Medical Work up               Expected Discharge Plan and Services In-house Referral: Clinical Social Work     Living arrangements for the past 2 months:  (from [facility] short term rehab)                                       Social Drivers of Health (SDOH) Interventions SDOH Screenings   Food Insecurity: No Food Insecurity (03/05/2024)  Housing: Low Risk  (03/05/2024)  Transportation Needs: No Transportation Needs (03/05/2024)  Utilities: Not At Risk (03/05/2024)  Depression (PHQ2-9): Low Risk  (10/03/2023)  Financial Resource Strain: Patient Declined (01/16/2024)  Physical Activity: Unknown (01/16/2024)  Social Connections: Moderately Integrated (03/05/2024)  Stress: Stress Concern Present (01/16/2024)  Tobacco Use: Low Risk  (07/04/2024)    Readmission Risk Interventions    03/19/2024   10:24 AM  Readmission Risk Prevention Plan  Transportation Screening Complete  Medication Review (RN Care Manager) Complete  PCP or Specialist appointment within 3-5 days of discharge Complete  HRI or Home Care Consult Complete  SW Recovery Care/Counseling Consult Complete  Palliative Care Screening Complete  Skilled Nursing Facility Complete

## 2024-07-30 DIAGNOSIS — R6520 Severe sepsis without septic shock: Secondary | ICD-10-CM | POA: Diagnosis not present

## 2024-07-30 DIAGNOSIS — A419 Sepsis, unspecified organism: Secondary | ICD-10-CM | POA: Diagnosis not present

## 2024-07-30 LAB — GLUCOSE, CAPILLARY
Glucose-Capillary: 95 mg/dL (ref 70–99)
Glucose-Capillary: 217 mg/dL — ABNORMAL HIGH (ref 70–99)
Glucose-Capillary: 119 mg/dL — ABNORMAL HIGH (ref 70–99)
Glucose-Capillary: 201 mg/dL — ABNORMAL HIGH (ref 70–99)

## 2024-07-30 NOTE — Plan of Care (Signed)
  Problem: Education: Goal: Knowledge of risk factors and measures for prevention of condition will improve Outcome: Progressing   Problem: Coping: Goal: Psychosocial and spiritual needs will be supported Outcome: Progressing   Problem: Respiratory: Goal: Will maintain a patent airway Outcome: Progressing   Problem: Education: Goal: Ability to describe self-care measures that may prevent or decrease complications (Diabetes Survival Skills Education) will improve Outcome: Progressing   Problem: Coping: Goal: Ability to adjust to condition or change in health will improve Outcome: Progressing   Problem: Fluid Volume: Goal: Ability to maintain a balanced intake and output will improve Outcome: Progressing   Problem: Metabolic: Goal: Ability to maintain appropriate glucose levels will improve Outcome: Progressing   Problem: Clinical Measurements: Goal: Ability to maintain clinical measurements within normal limits will improve Outcome: Progressing Goal: Will remain free from infection Outcome: Progressing Goal: Diagnostic test results will improve Outcome: Progressing Goal: Respiratory complications will improve Outcome: Progressing Goal: Cardiovascular complication will be avoided Outcome: Progressing

## 2024-07-30 NOTE — Progress Notes (Signed)
 PROGRESS NOTE  Earl [name]  FMW:[id number] DOB: Aug 03, 1941 DOA: 03/04/2024 PCP: [name], MD  Consultants  Brief Narrative: Earl [name] is an 83 year old M with PMH of PAF on Eliquis , HFmrEF, amyloid cardiomyopathy, NIDDM-2, CVA with right-sided weakness, stage IV sacral wound, pancytopenia, MGUS, HTN, HLD and hypothyroidism brought to ED by EMS from [facility] rehab due to pain in sacral area and shortness of breath 4 days after he was discharged from the hospital.  Patient was hospitalized from 3/26-4/10 with generalized weakness, encephalopathy and COVID-19 infection. Patient came back with skin injury suffered during previous hospitalization, and he had significant pain, discomfort and progressive foul-smelling discharge from sacral wound over the course of 4 to 5 days before coming back to ED. he was admitted due to persistent wounds.  General surgery, orthopedic surgery, ID, palliative all consulted.  Patient is in the hospital for prolonged time.  This note is created on the basis of previous documentation and events along with present events.  Significant imaging / results / micro data: 4/14-COVID-19 PCR positive.  Flu and RSV PCR nonreactive 4/14-blood cultures NGTD 6/4-sacral wound culture with Pseudomonas aeruginosa and Klebsiella pneumoniae 7/14-pleural fluid culture NGTD   New events last 24 hours / Subjective: Patient seen and examined in the morning rounds.  Patient himself denies any complaints to me today.  He tells me he ate regular food last night.  Currently denies any pain or discomfort.  Remains afebrile.  Nursing has no concerns overnight.  Assessment & Plan:   Principal Problem:   Severe sepsis (HCC) Active Problems:   Paroxysmal atrial fibrillation (HCC)   Sepsis (HCC)   Atrial flutter (HCC)   Sacral ulcer (HCC)   MGUS (monoclonal gammopathy of unknown significance)   Essential hypertension   Hyperlipidemia   Insulin  dependent type 2  diabetes mellitus (HCC)   History of CVA (cerebrovascular accident)   Cardiac amyloidosis (HCC)   Chronic systolic CHF (congestive heart failure) (HCC)   GERD (gastroesophageal reflux disease)   History of hypertension   Atrial fibrillation with RVR (HCC)   Community acquired pneumonia   Protein-calorie malnutrition, severe   Wild-type transthyretin-related (ATTR) amyloidosis (HCC)   Sacral decubitus ulcer, stage IV (HCC)   Pressure injury of skin of trochanteric region of left hip    Stage IV sacral decubitus wound/ulcer with severe sepsis on admission: Treated as below. - Culture data as above - 4/14-CT showed sacral wound extension to the right gluteus muscle without signs of osteomyelitis. - 4/15-underwent bedside debridement of sacral decubitus wound by general surgery - 4/21-ID consulted and recommended continuing broad-spectrum antibiotics that he completed on 6/13. 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 - 6/17- ID recommended continuing holding off antibiotics since he is hemodynamically stable, afebrile without leukocytosis. - 6/17-evaluated by plastic surgery.  Sacral wound felt to be clean.  Wound care recommended - 7/13-7/13-CT showed similar appearance of osteomyelitis underlying sacral decubitus ulcer.  - 7/13-plastic surgery recommended for continued wound care and no surgical management. - 7/14-Ortho evaluated patient for left hip wound.  Debulked and recommended optimizing nutrition - 8/5- Surgery recommended hydrotherapy for left hip wound. Surgical debridement if not successful - 8/5-wound care and instruction updated  - 8/15-left hip wound worse with tunneling. X-ray with left lateral hip soft tissue defect.  No acute bony abnormality.  -Overall, difficult case due to patient's nutritional status and limited mobility.  Prealbumin <5. - CT scan of the hip 8/15 with new left hip  wound which extends  deep, 2.8 cm without evidence of acute osteolysis involving the underlying left greater trochanter.  Ortho was reconsulted on 07/12/2024: Recommended conservative management.  Continue wound care as per wound care consult recommendations. --Updated pictures in the chart available from 9/8.   Severe sepsis  - Present on admission.  Resolved.   Aspiration pneumonia  - Patient with worsening congestion, increased cough on 8/18.  Chest x-ray 8/18 with lower lobes collapsed and possible underlying pneumonia, likely due to being in bed for prolonged amount of time.  Completed 5 days of Rocephin  and Flagyl  on 07/12/2024. - Diet as per SLP's recommendations, today on regular diet with aspiration precautions.  Family is aware.   Acute on chronic combined CHF, cardiac amyloidosis, nonischemic cardiomyopathy, hypotension  - On tafamidis  and vutrisiran  for amyloid.  Guideline-directed medical therapy somewhat limited by prior episodes of hypotension.   - Has received few doses of IV Lasix  recently along with IV albumin .  Currently on oral Lasix . - Continue Tafamidis  and vutrisiran . - Ace wrap for BLE edema as much he can do. - periodic checks of kidney function and electrolytes, stable now.   Elevated LFTs  - Lipitor reintroduced.  Will check LFTs intermittently.  Stable now.     Acute on chronic pain - Continue current pain management with lidocaine  patch and as needed oxycodone   - Greatly appreciate palliative help with this plan.     PAF - Continue amiodarone  and Eliquis .  Currently rate controlled.  No evidence of blood loss.   Anemia of chronic disease - From chronic illnesses.  Monitor intermittently.  No signs of bleeding.  Hemoglobin currently stable.   History of CVA - Stable, on Eliquis , back on Lipitor 10.   Concern for acute cholecystitis - Ruled out, right upper quadrant ultrasound initially raise concern, however HIDA scan was negative.  This is likely due to anasarca/third spacing.      Dysphagia/severe protein calorie malnutrition - Continue supplementation, currently on regular diet but does remain with aspiration concerns.  All-time aspiration precautions.   Physical debilitation, muscle weakness - PT/OT ongoing.  Challenging with getting debilitated every day.   Goals of care  - Daughter wants him to transition to home once his wounds are better.  Patient himself also declines SNF (has capacity currently).  - Daughter does not want SNF or LTAC.  Palliative care following.  Patient is DNR. - Albumin  remains low, contributing to poor wound healing.  - Overall prognosis remains poor.  Greatly appreciate palliative's input  - Labs rechecked,  remain stable on 9/7.  Can check every week.   Hypothyroidism  -Continue levothyroxine    Anxiety and adjustment disorder  - Psychiatry consulted on 7/7 and recommended continuing Zoloft .  Psychiatry has signed off.   IDDM  - Continue sliding scale.  Hypoglycemic episode 9/5 night.  On sliding scale insulin .  No more on long-acting insulin .  Encourage nutrition.  Called and updated patient's daughter on 9/8.  Medication timing adjustment done.  She also had appropriate question about why we are writing poor prognosis on the chart.  Updated her my clinical impression that he has not done any progress since hospitalization, his wounds have not improved despite maximum therapies in the hospital settings last 5 months and these wounds may not heal.    DVT prophylaxis:  SCDs Start: 03/04/24 2151 Place TED hose Start: 03/04/24 2151 apixaban  (ELIQUIS ) tablet 5 mg  Code Status:   Code Status: Limited: Do not attempt resuscitation (DNR) -  DNR-LIMITED -Do Not Intubate/DNI  Level of care: Telemetry Medical Status is: Inpatient   Objective: Vitals:   07/29/24 1517 07/29/24 2106 07/29/24 2358 07/30/24 0334  BP: 108/66 114/71 115/71 110/64  Pulse: 72 72 70 68  Resp: 18 (!) 22 (!) 22 20  Temp: (!) 97.5 F (36.4 C) (!) 97.5 F  (36.4 C) 98 F (36.7 C) 97.8 F (36.6 C)  TempSrc: Oral Oral Oral Oral  SpO2: 97% 100% 97% 99%  Weight:      Height:        Intake/Output Summary (Last 24 hours) at 07/30/2024 0827 Last data filed at 07/29/2024 0900 Gross per 24 hour  Intake 240 ml  Output --  Net 240 ml   Filed Weights   07/16/24 0500 07/18/24 0205 07/19/24 0500  Weight: 69.4 kg 69.4 kg 69.4 kg   Body mass index is 21.34 kg/m.  Examination:   General: Very debilitated gentleman.  Pleasant to conversation.  He is alert awake and oriented. Cardiovascular: S1-S2 normal.  Regular rate rhythm. Respiratory: Bilateral clear.  Occasional upper airway sounds.  He is mostly on room air.  He has poor inspiratory effort. Gastrointestinal: Soft.  Nontender.  Bowel sound present. Ext: Patient has anasarca.  He has subcutaneous swelling of the right arm, he has significant subcutaneous swelling of the right groin area due to dependent edema.  He also has edema of the legs. Neuro: Alert and awake.  Gross generalized weakness. Patient has multiple pressure ulcers, recent updated pictures from 9/8 available in the chart.  Descriptions with wound care notes.    I have personally reviewed the following labs and images: CBC: Recent Labs  Lab 07/24/24 0243 07/28/24 0202  WBC 5.7 6.2  NEUTROABS 3.4  --   HGB 10.6* 10.8*  HCT 31.0* 31.7*  MCV 86.8 87.6  PLT 248 237   BMP &GFR Recent Labs  Lab 07/24/24 0243 07/28/24 0202  NA 141 138  K 4.1 4.3  CL 105 104  CO2 25 24  GLUCOSE 135* 154*  BUN 33* 32*  CREATININE 0.80 0.92  CALCIUM  8.4* 8.5*   Estimated Creatinine Clearance: 59.7 mL/min (by C-G formula based on SCr of 0.92 mg/dL). Liver & Pancreas: Recent Labs  Lab 07/28/24 0202  AST 34  ALT 23  ALKPHOS 221*  BILITOT 2.1*  PROT 5.6*  ALBUMIN  2.4*   No results for input(s): LIPASE, AMYLASE in the last 168 hours. No results for input(s): AMMONIA in the last 168 hours. Diabetic: No results for  input(s): HGBA1C in the last 72 hours. Recent Labs  Lab 07/29/24 0851 07/29/24 1228 07/29/24 1634 07/29/24 2107 07/30/24 0756  GLUCAP 102* 141* 136* 132* 95   Cardiac Enzymes: No results for input(s): CKTOTAL, CKMB, CKMBINDEX, TROPONINI in the last 168 hours. No results for input(s): PROBNP in the last 8760 hours. Coagulation Profile: No results for input(s): INR, PROTIME in the last 168 hours. Thyroid  Function Tests: No results for input(s): TSH, T4TOTAL, FREET4, T3FREE, THYROIDAB in the last 72 hours. Lipid Profile: No results for input(s): CHOL, HDL, LDLCALC, TRIG, CHOLHDL, LDLDIRECT in the last 72 hours. Anemia Panel: No results for input(s): VITAMINB12, FOLATE, FERRITIN, TIBC, IRON, RETICCTPCT in the last 72 hours. Urine analysis:    Component Value Date/Time   COLORURINE STRAW (A) 03/04/2024 2139   APPEARANCEUR CLEAR 03/04/2024 2139   LABSPEC 1.016 03/04/2024 2139   PHURINE 6.0 03/04/2024 2139   GLUCOSEU >=500 (A) 03/04/2024 2139   HGBUR SMALL (A) 03/04/2024 2139  BILIRUBINUR NEGATIVE 03/04/2024 2139   KETONESUR NEGATIVE 03/04/2024 2139   PROTEINUR 30 (A) 03/04/2024 2139   UROBILINOGEN 0.2 05/05/2015 1443   NITRITE NEGATIVE 03/04/2024 2139   LEUKOCYTESUR NEGATIVE 03/04/2024 2139   Sepsis Labs: Invalid input(s): PROCALCITONIN, LACTICIDVEN  Microbiology: No results found for this or any previous visit (from the past 240 hours).  Radiology Studies: No results found.  Scheduled Meds:  (feeding supplement) PROSource Plus  30 mL Oral BID BM   amiodarone   200 mg Oral Daily   apixaban   5 mg Oral BID   atorvastatin   10 mg Oral Daily   collagenase    Topical Daily   diclofenac  Sodium  4 g Topical QID   feeding supplement  237 mL Oral BID BM   ferrous sulfate   325 mg Oral QHS   fluticasone   1 spray Each Nare Daily   furosemide   20 mg Oral Daily   insulin  aspart  0-9 Units Subcutaneous TID WC   levothyroxine   50  mcg Oral Q0600   lidocaine   1 patch Transdermal Q24H   liver oil-zinc  oxide   Topical BID   loratadine   10 mg Oral Daily   multivitamin with minerals  1 tablet Oral Daily   nutrition supplement (JUVEN)  1 packet Oral BID BM   mouth rinse  15 mL Mouth Rinse 4 times per day   pantoprazole   40 mg Oral Daily   senna-docusate  1 tablet Oral QHS   sertraline   50 mg Oral Daily   sodium chloride  flush  10-40 mL Intracatheter Q12H   Tafamidis   1 capsule Oral Daily   vitamin A   10,000 Units Oral Daily   vutrisiran  sodium  25 mg Subcutaneous Q90 days   Continuous Infusions:   LOS: 148 days   35 minutes with more than 50% spent in reviewing records, counseling patient/family and coordinating care.  Renato Applebaum, MD [facility] [email or web address] 07/30/2024, 8:27 AM

## 2024-07-30 NOTE — Plan of Care (Signed)
  Problem: Fluid Volume: Goal: Hemodynamic stability will improve Outcome: Progressing   Problem: Clinical Measurements: Goal: Diagnostic test results will improve Outcome: Progressing Goal: Signs and symptoms of infection will decrease Outcome: Progressing   Problem: Education: Goal: Knowledge of risk factors and measures for prevention of condition will improve Outcome: Progressing   Problem: Coping: Goal: Psychosocial and spiritual needs will be supported Outcome: Progressing   Problem: Respiratory: Goal: Will maintain a patent airway Outcome: Progressing Goal: Complications related to the disease process, condition or treatment will be avoided or minimized Outcome: Progressing   Problem: Education: Goal: Ability to describe self-care measures that may prevent or decrease complications (Diabetes Survival Skills Education) will improve Outcome: Progressing   Problem: Coping: Goal: Ability to adjust to condition or change in health will improve Outcome: Progressing   Problem: Fluid Volume: Goal: Ability to maintain a balanced intake and output will improve Outcome: Progressing   Problem: Health Behavior/Discharge Planning: Goal: Ability to manage health-related needs will improve Outcome: Progressing   Problem: Metabolic: Goal: Ability to maintain appropriate glucose levels will improve Outcome: Progressing   Problem: Nutritional: Goal: Maintenance of adequate nutrition will improve Outcome: Progressing Goal: Progress toward achieving an optimal weight will improve Outcome: Progressing   Problem: Skin Integrity: Goal: Risk for impaired skin integrity will decrease Outcome: Progressing   Problem: Tissue Perfusion: Goal: Adequacy of tissue perfusion will improve Outcome: Progressing   Problem: Education: Goal: Knowledge of General Education information will improve Description: Including pain rating scale, medication(s)/side effects and non-pharmacologic  comfort measures Outcome: Progressing   Problem: Health Behavior/Discharge Planning: Goal: Ability to manage health-related needs will improve Outcome: Progressing   Problem: Clinical Measurements: Goal: Ability to maintain clinical measurements within normal limits will improve Outcome: Progressing Goal: Will remain free from infection Outcome: Progressing Goal: Diagnostic test results will improve Outcome: Progressing Goal: Respiratory complications will improve Outcome: Progressing Goal: Cardiovascular complication will be avoided Outcome: Progressing   Problem: Activity: Goal: Risk for activity intolerance will decrease Outcome: Progressing   Problem: Nutrition: Goal: Adequate nutrition will be maintained Outcome: Progressing   Problem: Coping: Goal: Level of anxiety will decrease Outcome: Progressing   Problem: Elimination: Goal: Will not experience complications related to bowel motility Outcome: Progressing Goal: Will not experience complications related to urinary retention Outcome: Progressing   Problem: Pain Managment: Goal: General experience of comfort will improve and/or be controlled Outcome: Progressing   Problem: Safety: Goal: Ability to remain free from injury will improve Outcome: Progressing   Problem: Skin Integrity: Goal: Risk for impaired skin integrity will decrease Outcome: Progressing

## 2024-07-31 DIAGNOSIS — A419 Sepsis, unspecified organism: Secondary | ICD-10-CM | POA: Diagnosis not present

## 2024-07-31 DIAGNOSIS — R6520 Severe sepsis without septic shock: Secondary | ICD-10-CM | POA: Diagnosis not present

## 2024-07-31 LAB — GLUCOSE, CAPILLARY
Glucose-Capillary: 109 mg/dL — ABNORMAL HIGH (ref 70–99)
Glucose-Capillary: 188 mg/dL — ABNORMAL HIGH (ref 70–99)
Glucose-Capillary: 140 mg/dL — ABNORMAL HIGH (ref 70–99)
Glucose-Capillary: 129 mg/dL — ABNORMAL HIGH (ref 70–99)

## 2024-07-31 MED ORDER — POLYETHYLENE GLYCOL 3350 17 G PO PACK
17.0000 g | PACK | Freq: Two times a day (BID) | ORAL | Status: DC
  Administered 2024-07-31 – 2024-08-01 (×2): 17 g via ORAL
  Filled 2024-07-31 (×2): qty 1

## 2024-07-31 NOTE — Plan of Care (Signed)
  Problem: Fluid Volume: Goal: Hemodynamic stability will improve Outcome: Progressing   Problem: Clinical Measurements: Goal: Diagnostic test results will improve Outcome: Progressing Goal: Signs and symptoms of infection will decrease Outcome: Progressing   Problem: Education: Goal: Knowledge of risk factors and measures for prevention of condition will improve Outcome: Progressing   Problem: Coping: Goal: Psychosocial and spiritual needs will be supported Outcome: Progressing   Problem: Respiratory: Goal: Will maintain a patent airway Outcome: Progressing Goal: Complications related to the disease process, condition or treatment will be avoided or minimized Outcome: Progressing   Problem: Education: Goal: Ability to describe self-care measures that may prevent or decrease complications (Diabetes Survival Skills Education) will improve Outcome: Progressing   Problem: Coping: Goal: Ability to adjust to condition or change in health will improve Outcome: Progressing   Problem: Fluid Volume: Goal: Ability to maintain a balanced intake and output will improve Outcome: Progressing   Problem: Health Behavior/Discharge Planning: Goal: Ability to manage health-related needs will improve Outcome: Progressing   Problem: Metabolic: Goal: Ability to maintain appropriate glucose levels will improve Outcome: Progressing   Problem: Nutritional: Goal: Maintenance of adequate nutrition will improve Outcome: Progressing Goal: Progress toward achieving an optimal weight will improve Outcome: Progressing   Problem: Skin Integrity: Goal: Risk for impaired skin integrity will decrease Outcome: Progressing   Problem: Tissue Perfusion: Goal: Adequacy of tissue perfusion will improve Outcome: Progressing   Problem: Education: Goal: Knowledge of General Education information will improve Description: Including pain rating scale, medication(s)/side effects and non-pharmacologic  comfort measures Outcome: Progressing   Problem: Health Behavior/Discharge Planning: Goal: Ability to manage health-related needs will improve Outcome: Progressing   Problem: Clinical Measurements: Goal: Ability to maintain clinical measurements within normal limits will improve Outcome: Progressing Goal: Will remain free from infection Outcome: Progressing Goal: Diagnostic test results will improve Outcome: Progressing Goal: Respiratory complications will improve Outcome: Progressing Goal: Cardiovascular complication will be avoided Outcome: Progressing   Problem: Activity: Goal: Risk for activity intolerance will decrease Outcome: Progressing   Problem: Nutrition: Goal: Adequate nutrition will be maintained Outcome: Progressing   Problem: Coping: Goal: Level of anxiety will decrease Outcome: Progressing   Problem: Elimination: Goal: Will not experience complications related to bowel motility Outcome: Progressing Goal: Will not experience complications related to urinary retention Outcome: Progressing   Problem: Pain Managment: Goal: General experience of comfort will improve and/or be controlled Outcome: Progressing   Problem: Safety: Goal: Ability to remain free from injury will improve Outcome: Progressing   Problem: Skin Integrity: Goal: Risk for impaired skin integrity will decrease Outcome: Progressing

## 2024-07-31 NOTE — Progress Notes (Signed)
 Mobility Specialist: Progress Note   07/31/24 1600  Mobility  Activity  (UE/LE HEP)  Range of Motion/Exercises Active Assistive;Active;All extremities  Activity Response Tolerated well  Mobility Referral Yes  Mobility visit 1 Mobility  Mobility Specialist Start Time (ACUTE ONLY) 1124  Mobility Specialist Stop Time (ACUTE ONLY) 1200  Mobility Specialist Time Calculation (min) (ACUTE ONLY) 36 min     Pt received in bed, agreeable to mobility session. Performed UE and LE exercises, tolerated well. Left in bed with all needs met, call bell in reach.    UE (with theraband): chest press, front raises, crossbody punches (2 sets, 15x each) LE: bicycles (3 rounds, 30s each), leg lifts (5x each leg)  Ileana Lute Mobility Specialist Please contact via SecureChat or Rehab office at [phone]

## 2024-07-31 NOTE — Progress Notes (Signed)
 PROGRESS NOTE  Earl [name]  FMW:[id number] DOB: 07-Sep-1941 DOA: 03/04/2024 PCP: [name], MD  Consultants  Brief Narrative: Earl [name] is an 83 year old M with PMH of PAF on Eliquis , HFmrEF, amyloid cardiomyopathy, NIDDM-2, CVA with right-sided weakness, stage IV sacral wound, pancytopenia, MGUS, HTN, HLD and hypothyroidism brought to ED by EMS from [facility] rehab due to pain in sacral area and shortness of breath 4 days after he was discharged from the hospital.  Patient was hospitalized from 3/26-4/10 with generalized weakness, encephalopathy and COVID-19 infection. Patient came back with skin injury suffered during previous hospitalization, and he had significant pain, discomfort and progressive foul-smelling discharge from sacral wound over the course of 4 to 5 days before coming back to ED. he was admitted due to persistent wounds.  General surgery, orthopedic surgery, ID, palliative all consulted.  Patient is in the hospital for prolonged time.    Significant imaging / results / micro data: 4/14-COVID-19 PCR positive.  Flu and RSV PCR nonreactive 4/14-blood cultures NGTD 6/4-sacral wound culture with Pseudomonas aeruginosa and Klebsiella pneumoniae 7/14-pleural fluid culture NGTD   Subjective:  Doing fair sitting up at bedside--complaining of pain in buttock which is severe NT reports 100% breakfast, 40% lunch Daughter at bedside concerned about sorbitol  and the fact that it washes them out-we elected on twice daily MiraLAX  and senna after discussion with her  Assessment & Plan:   Principal Problem:   Severe sepsis (HCC) Active Problems:   Paroxysmal atrial fibrillation (HCC)   Sepsis (HCC)   Atrial flutter (HCC)   Sacral ulcer (HCC)   MGUS (monoclonal gammopathy of unknown significance)   Essential hypertension   Hyperlipidemia   Insulin  dependent type 2 diabetes mellitus (HCC)   History of CVA (cerebrovascular accident)   Cardiac amyloidosis  (HCC)   Chronic systolic CHF (congestive heart failure) (HCC)   GERD (gastroesophageal reflux disease)   History of hypertension   Atrial fibrillation with RVR (HCC)   Community acquired pneumonia   Protein-calorie malnutrition, severe   Wild-type transthyretin-related (ATTR) amyloidosis (HCC)   Sacral decubitus ulcer, stage IV (HCC)   Pressure injury of skin of trochanteric region of left hip    Stage IV sacral decubitus wound/ulcer with severe sepsis on admission: Treated as below. - Culture data as above - 4/14-CT showed sacral wound extension to the right gluteus muscle without signs of osteomyelitis. - 4/15-underwent bedside debridement of sacral decubitus wound by general surgery - 4/21-ID consulted and recommended continuing broad-spectrum antibiotics that he completed on 6/13. 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 - 6/17- ID recommended continuing holding off antibiotics since he is hemodynamically stable, afebrile without leukocytosis. - 6/17-evaluated by plastic surgery.  Sacral wound felt to be clean.  Wound care recommended - 7/13-7/13-CT showed similar appearance of osteomyelitis underlying sacral decubitus ulcer.  - 7/13-plastic surgery recommended for continued wound care and no surgical management. - 7/14-Ortho evaluated patient for left hip wound.  Debulked and recommended optimizing nutrition - 8/5- Surgery recommended hydrotherapy for left hip wound. Surgical debridement if not successful - 8/5-wound care and instruction updated  - 8/15-left hip wound worse with tunneling. X-ray with left lateral hip soft tissue defect.  No acute bony abnormality.  -Overall, difficult case due to patient's nutritional status and limited mobility.  Prealbumin <5. - CT scan of the hip 8/15 with new left hip wound which extends deep, 2.8 cm without evidence of acute osteolysis involving the underlying left greater  trochanter.  Ortho was reconsulted on 07/12/2024: Recommended conservative management.  Continue wound care as per wound care consult recommendations.      Current wound care   Routine, Daily, First occurrence on Tue 07/30/24 at 0500 1- Left shoulder Unstageable Cleanse with NS, pat dry, Apply 1/4 thick layer of Santyl  to wound bed, top with saline moist gauze. Top with silicone foam. Change daily. 2- Left trochanter: NPWT WOC will change 2x weekly 3 - Sacral/ L buttocks: Cleanse with NS. Apply 1/4 thick layer of Santyl  to wound bed, top with saline moist gauze. Top with silicone foam. Change daily. 3- Right heel; Unstageable; Cleanse with NS, pat dry. Apply Aquacel Ag+ (silver hydrofiber) Lawson # [id number] and cover with silicone foam dressing. Change daily. Off load bilateral heels with Prevalon boots at all times. LALM in place for moisture management and pressure redistribution.  Severe sepsis  - Present on admission.  Resolved.   Aspiration pneumonia  - Patient with worsening congestion, increased cough on 8/18.  Chest x-ray 8/18 with lower lobes collapsed and possible underlying pneumonia, likely due to being in bed for prolonged amount of time.  Completed 5 days of Rocephin  and Flagyl  on 07/12/2024. - Diet as per SLP's recommendations-appreciative of SLP continued input   Acute on chronic combined CHF, cardiac amyloidosis, nonischemic cardiomyopathy, hypotension  - On tafamidis  and vutrisiran  for amyloid.  G DMT limited 2/2 prior episodes of hypotension.   - Has received few doses of IV Lasix  recently along with IV albumin .  Continue Lasix  20 p.o. daily - Continue Tafamidis  and vutrisiran . - Ace wrap for BLE edema  - periodic checks of kidney function and electrolytes, stable now.   Elevated LFTs  - Lipitor reintroduced.  LFTs a.m.     Acute on chronic pain - Continue current pain management with lidocaine  patch and as needed oxycodone  2.5 3 times daily as needed - Greatly appreciate  palliative help with this plan.     PAF - Continue amiodarone  200 daily-and Eliquis  5 twice daily.  Currently rate controlled.  Rechecking labs a.m.   Anemia of chronic disease - From chronic illnesses.  Monitor intermittently.  No signs of bleeding.  Hemoglobin currently stable.   History of CVA - Stable, on Eliquis , back on Lipitor 10.   Concern for acute cholecystitis - Ruled out, right upper quadrant ultrasound initially raise concern, however HIDA scan was negative.  This is likely due to anasarca/third spacing.     Dysphagia/severe protein calorie malnutrition - Continue supplementation, currently on regular diet but does remain with aspiration concerns.  All-time aspiration precautions--- daughter is very diligent and nursing has been sensitized to keeping him sitting up   Physical debilitation, muscle weakness - PT/OT ongoing.  Challenging with getting debilitated every day.   Goals of care  - Daughter wants him to transition to home once his wounds are better.   - Progressive pictures of wounds show a new area on the right upper buttock ).  - Daughter does not want SNF or LTAC.  Palliative care  Dr. Marvine aware of patient and I will let her know daughters availability - Albumin  remains low, contributing to poor wound healing.  - Overall prognosis remains poor--he is stable but there is high propensity for him to develop tracking/worsening sacral decubitus-I mentioned this explicitly to family 9/10   Hypothyroidism  -Continue levothyroxine    Anxiety and adjustment disorder  - Psychiatry consulted on 7/7 and recommended continuing Zoloft  50 daily.  Psychiatry has signed off.  IDDM  - Continue sliding scale.  Hypoglycemic episode 9/5 night.  On sliding scale insulin .  No more on long-acting insulin .  Encourage nutrition.  Met and discussed with Para, patient's daughter at the bedside    DVT prophylaxis:  SCDs Start: 03/04/24 2151 Place TED hose Start: 03/04/24  2151 apixaban  (ELIQUIS ) tablet 5 mg  Code Status:   Code Status: Limited: Do not attempt resuscitation (DNR) -DNR-LIMITED -Do Not Intubate/DNI  Level of care: Telemetry Medical Status is: Inpatient   Objective: Vitals:   07/31/24 0420 07/31/24 0836 07/31/24 1235 07/31/24 1637  BP: 101/69 108/63 116/65 112/71  Pulse: 64 67 70 70  Resp:  19 19 19   Temp: (!) 97.4 F (36.3 C) 97.8 F (36.6 C) 98.9 F (37.2 C)   TempSrc:      SpO2: 100% 98% 98% 100%  Weight:      Height:        Intake/Output Summary (Last 24 hours) at 07/31/2024 1721 Last data filed at 07/31/2024 1235 Gross per 24 hour  Intake 343 ml  Output 1500 ml  Net -1157 ml   Filed Weights   07/16/24 0500 07/18/24 0205 07/19/24 0500  Weight: 69.4 kg 69.4 kg 69.4 kg   Body mass index is 21.34 kg/m.  Examination:   Awake coherent oriented debilitated cachectic S1-S2 no murmur Chest is clear no wheeze rales rhonchi posterolaterally Abdomen soft Anasarca Wounds examined as per above pictures    I have personally reviewed the following labs and images: CBC: Recent Labs  Lab 07/28/24 0202  WBC 6.2  HGB 10.8*  HCT 31.7*  MCV 87.6  PLT 237   BMP &GFR Recent Labs  Lab 07/28/24 0202  NA 138  K 4.3  CL 104  CO2 24  GLUCOSE 154*  BUN 32*  CREATININE 0.92  CALCIUM  8.5*   Estimated Creatinine Clearance: 59.7 mL/min (by C-G formula based on SCr of 0.92 mg/dL). Liver & Pancreas: Recent Labs  Lab 07/28/24 0202  AST 34  ALT 23  ALKPHOS 221*  BILITOT 2.1*  PROT 5.6*  ALBUMIN  2.4*   No results for input(s): LIPASE, AMYLASE in the last 168 hours. No results for input(s): AMMONIA in the last 168 hours. Diabetic: No results for input(s): HGBA1C in the last 72 hours. Recent Labs  Lab 07/30/24 1632 07/30/24 2016 07/31/24 0835 07/31/24 1233 07/31/24 1636  GLUCAP 119* 201* 109* 188* 140*   Cardiac Enzymes: No results for input(s): CKTOTAL, CKMB, CKMBINDEX, TROPONINI in the last 168  hours. No results for input(s): PROBNP in the last 8760 hours. Coagulation Profile: No results for input(s): INR, PROTIME in the last 168 hours. Thyroid  Function Tests: No results for input(s): TSH, T4TOTAL, FREET4, T3FREE, THYROIDAB in the last 72 hours. Lipid Profile: No results for input(s): CHOL, HDL, LDLCALC, TRIG, CHOLHDL, LDLDIRECT in the last 72 hours. Anemia Panel: No results for input(s): VITAMINB12, FOLATE, FERRITIN, TIBC, IRON, RETICCTPCT in the last 72 hours. Urine analysis:    Component Value Date/Time   COLORURINE STRAW (A) 03/04/2024 2139   APPEARANCEUR CLEAR 03/04/2024 2139   LABSPEC 1.016 03/04/2024 2139   PHURINE 6.0 03/04/2024 2139   GLUCOSEU >=500 (A) 03/04/2024 2139   HGBUR SMALL (A) 03/04/2024 2139   BILIRUBINUR NEGATIVE 03/04/2024 2139   KETONESUR NEGATIVE 03/04/2024 2139   PROTEINUR 30 (A) 03/04/2024 2139   UROBILINOGEN 0.2 05/05/2015 1443   NITRITE NEGATIVE 03/04/2024 2139   LEUKOCYTESUR NEGATIVE 03/04/2024 2139   Sepsis Labs: Invalid input(s): PROCALCITONIN, LACTICIDVEN  Microbiology: No results  found for this or any previous visit (from the past 240 hours).  Radiology Studies: No results found.  Scheduled Meds:  (feeding supplement) PROSource Plus  30 mL Oral BID BM   amiodarone   200 mg Oral Daily   apixaban   5 mg Oral BID   atorvastatin   10 mg Oral Daily   collagenase    Topical Daily   diclofenac  Sodium  4 g Topical QID   feeding supplement  237 mL Oral BID BM   ferrous sulfate   325 mg Oral QHS   fluticasone   1 spray Each Nare Daily   furosemide   20 mg Oral Daily   insulin  aspart  0-9 Units Subcutaneous TID WC   levothyroxine   50 mcg Oral Q0600   lidocaine   1 patch Transdermal Q24H   liver oil-zinc  oxide   Topical BID   loratadine   10 mg Oral Daily   multivitamin with minerals  1 tablet Oral Daily   nutrition supplement (JUVEN)  1 packet Oral BID BM   mouth rinse  15 mL Mouth Rinse 4 times  per day   pantoprazole   40 mg Oral Daily   polyethylene glycol  17 g Oral BID   senna-docusate  1 tablet Oral QHS   sertraline   50 mg Oral Daily   sodium chloride  flush  10-40 mL Intracatheter Q12H   Tafamidis   1 capsule Oral Daily   vitamin A   10,000 Units Oral Daily   vutrisiran  sodium  25 mg Subcutaneous Q90 days   Continuous Infusions:   LOS: 149 days   27 minutes  Colen Grimes, MD [facility] [email or web address] 07/31/2024, 5:21 PM

## 2024-08-01 DIAGNOSIS — R6520 Severe sepsis without septic shock: Secondary | ICD-10-CM | POA: Diagnosis not present

## 2024-08-01 DIAGNOSIS — A419 Sepsis, unspecified organism: Secondary | ICD-10-CM | POA: Diagnosis not present

## 2024-08-01 LAB — CBC WITH DIFFERENTIAL/PLATELET
Abs Immature Granulocytes: 0.05 K/uL (ref 0.00–0.07)
Basophils Absolute: 0.1 K/uL (ref 0.0–0.1)
Basophils Relative: 1 %
Eosinophils Absolute: 0.2 K/uL (ref 0.0–0.5)
Eosinophils Relative: 3 %
HCT: 31.3 % — ABNORMAL LOW (ref 39.0–52.0)
Hemoglobin: 10.5 g/dL — ABNORMAL LOW (ref 13.0–17.0)
Immature Granulocytes: 1 %
Lymphocytes Relative: 21 %
Lymphs Abs: 1.1 K/uL (ref 0.7–4.0)
MCH: 29.5 pg (ref 26.0–34.0)
MCHC: 33.5 g/dL (ref 30.0–36.0)
MCV: 87.9 fL (ref 80.0–100.0)
Monocytes Absolute: 0.9 K/uL (ref 0.1–1.0)
Monocytes Relative: 17 %
Neutro Abs: 3 K/uL (ref 1.7–7.7)
Neutrophils Relative %: 57 %
Platelets: 234 K/uL (ref 150–400)
RBC: 3.56 MIL/uL — ABNORMAL LOW (ref 4.22–5.81)
RDW: 20.2 % — ABNORMAL HIGH (ref 11.5–15.5)
WBC: 5.3 K/uL (ref 4.0–10.5)
nRBC: 0 % (ref 0.0–0.2)

## 2024-08-01 LAB — COMPREHENSIVE METABOLIC PANEL WITH GFR
ALT: 21 U/L (ref 0–44)
AST: 33 U/L (ref 15–41)
Albumin: 2.3 g/dL — ABNORMAL LOW (ref 3.5–5.0)
Alkaline Phosphatase: 240 U/L — ABNORMAL HIGH (ref 38–126)
Anion gap: 11 (ref 5–15)
BUN: 23 mg/dL (ref 8–23)
CO2: 25 mmol/L (ref 22–32)
Calcium: 8.4 mg/dL — ABNORMAL LOW (ref 8.9–10.3)
Chloride: 104 mmol/L (ref 98–111)
Creatinine, Ser: 0.79 mg/dL (ref 0.61–1.24)
GFR, Estimated: >60 mL/min (ref >60)
Glucose, Bld: 110 mg/dL — ABNORMAL HIGH (ref 70–99)
Potassium: 4.1 mmol/L (ref 3.5–5.1)
Sodium: 140 mmol/L (ref 135–145)
Total Bilirubin: 1.9 mg/dL — ABNORMAL HIGH (ref 0.0–1.2)
Total Protein: 5.4 g/dL — ABNORMAL LOW (ref 6.5–8.1)

## 2024-08-01 LAB — GLUCOSE, CAPILLARY
Glucose-Capillary: 110 mg/dL — ABNORMAL HIGH (ref 70–99)
Glucose-Capillary: 178 mg/dL — ABNORMAL HIGH (ref 70–99)
Glucose-Capillary: 155 mg/dL — ABNORMAL HIGH (ref 70–99)
Glucose-Capillary: 129 mg/dL — ABNORMAL HIGH (ref 70–99)

## 2024-08-01 MED ORDER — FERROUS SULFATE 325 (65 FE) MG PO TABS
325.0000 mg | ORAL_TABLET | ORAL | Status: DC
  Administered 2024-08-02 – 2024-08-20 (×10): 325 mg via ORAL
  Filled 2024-08-01 (×16): qty 1

## 2024-08-01 MED ORDER — SORBITOL 70 % SOLN
15.0000 mL | Freq: Every day | Status: DC
  Administered 2024-08-01 – 2024-08-02 (×2): 15 mL via ORAL
  Filled 2024-08-01 (×2): qty 30

## 2024-08-01 MED ORDER — ALBUMIN HUMAN 25 % IV SOLN
50.0000 g | Freq: Once | INTRAVENOUS | Status: AC
  Administered 2024-08-01: 50 g via INTRAVENOUS
  Filled 2024-08-01: qty 200

## 2024-08-01 NOTE — Consult Note (Signed)
 WOC Nurse wound follow up Wound type:  stage 4 pressure injury to left trochanter Measurement: 3 cm x 2.5 cm x 2 cm  with 7 cm undermining from 1 o'clock to 4 o'clock Wound bed: 90% red, moist 10% yellow Drainage (amount, consistency, odor) serosanguinous in cannister Periwound: intact Dressing procedure/placement/frequency:  Removed old NPWT dressing Cleansed wound with normal saline  Filled wound with   __1__ piece of black foam  Sealed NPWT dressing at HG Patient tolerated procedure well   WOC nurse will continue to provide NPWT dressing changed due to the complexity of the dressing change.     Doyal Polite, RN, MSN, [facility] WOC Team [phone] (Available Mon-Fri 0700-1500)

## 2024-08-01 NOTE — Plan of Care (Signed)
  Problem: Clinical Measurements: Goal: Diagnostic test results will improve Outcome: Progressing Goal: Signs and symptoms of infection will decrease Outcome: Progressing   Problem: Education: Goal: Knowledge of risk factors and measures for prevention of condition will improve Outcome: Progressing

## 2024-08-01 NOTE — Progress Notes (Signed)
 PROGRESS NOTE  Earl [name]  FMW:[id number] DOB: 12-03-1940 DOA: 03/04/2024 PCP: [name], MD  Consultants  Brief Narrative: Earl [name] is an 83 year old M with PMH of PAF on Eliquis , HFmrEF, amyloid cardiomyopathy, NIDDM-2, CVA with right-sided weakness, stage IV sacral wound, pancytopenia, MGUS, HTN, HLD and hypothyroidism brought to ED by EMS from [facility] rehab due to pain in sacral area and shortness of breath 4 days after he was discharged from the hospital.  Patient was hospitalized from 3/26-4/10 with generalized weakness, encephalopathy and COVID-19 infection. Patient came back with skin injury suffered during previous hospitalization, and he had significant pain, discomfort and progressive foul-smelling discharge from sacral wound over the course of 4 to 5 days before coming back to ED. he was admitted due to persistent wounds.  General surgery, orthopedic surgery, ID, palliative all consulted.  Patient is in the hospital for prolonged time.    Significant imaging / results / micro data: 4/14-COVID-19 PCR positive.  Flu and RSV PCR nonreactive 4/14-blood cultures NGTD 6/4-sacral wound culture with Pseudomonas aeruginosa and Klebsiella pneumoniae 7/14-pleural fluid culture NGTD   Subjective:  Constipated + Discussed re-trial of Sorbitol  one time as Enema didn't seem to retain per RN Wound vac changed today--looks like seems a bit larger?  Will discuss in am with WOC RN  Assessment & Plan:   Principal Problem:   Severe sepsis (HCC) Active Problems:   Paroxysmal atrial fibrillation (HCC)   Sepsis (HCC)   Atrial flutter (HCC)   Sacral ulcer (HCC)   MGUS (monoclonal gammopathy of unknown significance)   Essential hypertension   Hyperlipidemia   Insulin  dependent type 2 diabetes mellitus (HCC)   History of CVA (cerebrovascular accident)   Cardiac amyloidosis (HCC)   Chronic systolic CHF (congestive heart failure) (HCC)   GERD (gastroesophageal reflux  disease)   History of hypertension   Atrial fibrillation with RVR (HCC)   Community acquired pneumonia   Protein-calorie malnutrition, severe   Wild-type transthyretin-related (ATTR) amyloidosis (HCC)   Sacral decubitus ulcer, stage IV (HCC)   Pressure injury of skin of trochanteric region of left hip    Stage IV sacral decubitus wound/ulcer with severe sepsis on admission: Treated as below. - Culture data as above - 4/14-CT showed sacral wound extension to the right gluteus muscle without signs of osteomyelitis. - 4/15-underwent bedside debridement of sacral decubitus wound by general surgery - 4/21-ID consulted and recommended continuing broad-spectrum antibiotics that he completed on 6/13. 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 - 6/17- ID recommended continuing holding off antibiotics since he is hemodynamically stable, afebrile without leukocytosis. - 6/17-evaluated by plastic surgery.  Sacral wound felt to be clean.  Wound care recommended - 7/13-7/13-CT showed similar appearance of osteomyelitis underlying sacral decubitus ulcer.  - 7/13-plastic surgery recommended for continued wound care and no surgical management. - 7/14-Ortho evaluated patient for left hip wound.  Debulked and recommended optimizing nutrition - 8/5- Surgery recommended hydrotherapy for left hip wound. Surgical debridement if not successful - 8/5-wound care and instruction updated  - 8/15-left hip wound worse with tunneling. X-ray with left lateral hip soft tissue defect.  No acute bony abnormality.  -Overall, difficult case due to patient's nutritional status and limited mobility.  Prealbumin <5. - CT scan of the hip 8/15 with new left hip wound which extends deep, 2.8 cm without evidence of acute osteolysis involving the underlying left greater trochanter.  Ortho was reconsulted on 07/12/2024: Recommended conservative management.  Continue wound  care as per wound care consult recommendations. -Wound vac to hip changed on 9/11--will discuss implications with WOC rn and follow up with family when [facility] RN is available to discuss      Current wound care   Routine, Daily, First occurrence on Tue 07/30/24 at 0500 1- Left shoulder Unstageable Cleanse with NS, pat dry, Apply 1/4 thick layer of Santyl  to wound bed, top with saline moist gauze. Top with silicone foam. Change daily. 2- Left trochanter: NPWT WOC will change 2x weekly 3 - Sacral/ L buttocks: Cleanse with NS. Apply 1/4 thick layer of Santyl  to wound bed, top with saline moist gauze. Top with silicone foam. Change daily. 3- Right heel; Unstageable; Cleanse with NS, pat dry. Apply Aquacel Ag+ (silver hydrofiber) Lawson # [id number] and cover with silicone foam dressing. Change daily. Off load bilateral heels with Prevalon boots at all times. LALM in place for moisture management and pressure redistribution.  Severe sepsis  - Present on admission.  Resolved.   Aspiration pneumonia  - Patient with worsening congestion, increased cough on 8/18.  Chest x-ray 8/18 with lower lobes collapsed and possible underlying pneumonia, likely due to being in bed for prolonged amount of time.  Completed 5 days of Rocephin  and Flagyl  on 07/12/2024. - Diet as per SLP's recommendations-SLP signed off 9/8--Advanced to Reg solids, thin liquids--he will have intermittent cough with eating as a generality per them    Acute on chronic combined CHF, cardiac amyloidosis, nonischemic cardiomyopathy, hypotension  - On tafamidis  and vutrisiran  for amyloid.  G DMT limited 2/2 prior episodes of hypotension.   - Has received few doses of IV Lasix  recently along with IV albumin .  Continue Lasix  20 p.o. daily - Continue Tafamidis  and vutrisiran . - Ace wrap for BLE edema  - periodic checks of kidney function and electrolytes, stable now.   Elevated LFTs  - Lipitor reintroduced.  LFTs show slight elevaiton alk  phos--likely bony remodelling   Acute on chronic pain - Continue current pain management with lidocaine  patch and as needed oxycodone  2.5 3 times daily as needed - Greatly appreciate palliative help with this plan.     PAF - Continue amiodarone  200 daily-and Eliquis  5 twice daily.  Currently rate controlled.     Anemia of chronic disease - From chronic illnesses.  Monitor intermittently.  No signs of bleeding.  Hemoglobin currently stable.   History of CVA - Stable, on Eliquis , back on Lipitor 10.   Concern for acute cholecystitis - Ruled out, right upper quadrant ultrasound initially raise concern, however HIDA scan was negative.  This is likely due to anasarca/third spacing.     Dysphagia/severe protein calorie malnutrition - Continue supplementation, currently on regular diet but does remain with aspiration concerns.  All-time aspiration precautions--- daughter is very diligent and nursing has been sensitized to keeping him sitting up   Physical debilitation, muscle weakness - PT/OT ongoing.  Challenging with getting debilitated every day.   Hypothyroidism  -Continue levothyroxine    Anxiety and adjustment disorder  - Psychiatry consulted on 7/7 and recommended continuing Zoloft  50 daily.  Psychiatry has signed off.   IDDM  - Continue sliding scale.  Hypoglycemic episode 9/5 night.  On sliding scale insulin .  No more on long-acting insulin .  Encourage nutrition.  Goals of care  - Daughter wants him to transition to home once his wounds are better.   - Progressive pictures of wounds show a new area on the right upper buttock  - Daughter does not want  SNF or LTAC.  Palliative care  Dr. Marvine aware of patient and I will let her know daughters availability - Albumin  remains low, contributing to poor wound healing.  - Overall prognosis remains poor--he is stable but there is high propensity for him to develop tracking/worsening sacral decubitus-I mentioned this explicitly to  family 9/10 Palliative meeting is set for today 9/11     DVT prophylaxis:  SCDs Start: 03/04/24 2151 Place TED hose Start: 03/04/24 2151 apixaban  (ELIQUIS ) tablet 5 mg  Code Status:   Code Status: Limited: Do not attempt resuscitation (DNR) -DNR-LIMITED -Do Not Intubate/DNI  Level of care: Telemetry Medical Status is: Inpatient   Objective: Vitals:   08/01/24 0733 08/01/24 1227 08/01/24 1231 08/01/24 1602  BP: (!) 99/57 104/60  108/71  Pulse: 65  71 70  Resp: 19  17 19   Temp: 97.8 F (36.6 C)  97.8 F (36.6 C) 97.7 F (36.5 C)  TempSrc:      SpO2: 100%  96% 98%  Weight:      Height:        Intake/Output Summary (Last 24 hours) at 08/01/2024 1654 Last data filed at 08/01/2024 0800 Gross per 24 hour  Intake 760 ml  Output 100 ml  Net 660 ml   Filed Weights   07/18/24 0205 07/19/24 0500 08/01/24 0500  Weight: 69.4 kg 69.4 kg 70.8 kg   Body mass index is 21.76 kg/m.  Examination:   Awake coherent oriented debilitated cachectic S1-S2 no murmur Chest is clear no wheeze rales rhonchi posterolaterally Abdomen seems  alittle tender today Anasarca Wounds examined as per above pictures    I have personally reviewed the following labs and images: CBC: Recent Labs  Lab 07/28/24 0202 08/01/24 0536  WBC 6.2 5.3  NEUTROABS  --  3.0  HGB 10.8* 10.5*  HCT 31.7* 31.3*  MCV 87.6 87.9  PLT 237 234   BMP &GFR Recent Labs  Lab 07/28/24 0202 08/01/24 0536  NA 138 140  K 4.3 4.1  CL 104 104  CO2 24 25  GLUCOSE 154* 110*  BUN 32* 23  CREATININE 0.92 0.79  CALCIUM  8.5* 8.4*   Estimated Creatinine Clearance: 70.1 mL/min (by C-G formula based on SCr of 0.79 mg/dL). Liver & Pancreas: Recent Labs  Lab 07/28/24 0202 08/01/24 0536  AST 34 33  ALT 23 21  ALKPHOS 221* 240*  BILITOT 2.1* 1.9*  PROT 5.6* 5.4*  ALBUMIN  2.4* 2.3*   No results for input(s): LIPASE, AMYLASE in the last 168 hours. No results for input(s): AMMONIA in the last 168  hours. Diabetic: No results for input(s): HGBA1C in the last 72 hours. Recent Labs  Lab 07/31/24 1636 07/31/24 2016 08/01/24 0733 08/01/24 1231 08/01/24 1601  GLUCAP 140* 129* 110* 178* 155*   Cardiac Enzymes: No results for input(s): CKTOTAL, CKMB, CKMBINDEX, TROPONINI in the last 168 hours. No results for input(s): PROBNP in the last 8760 hours. Coagulation Profile: No results for input(s): INR, PROTIME in the last 168 hours. Thyroid  Function Tests: No results for input(s): TSH, T4TOTAL, FREET4, T3FREE, THYROIDAB in the last 72 hours. Lipid Profile: No results for input(s): CHOL, HDL, LDLCALC, TRIG, CHOLHDL, LDLDIRECT in the last 72 hours. Anemia Panel: No results for input(s): VITAMINB12, FOLATE, FERRITIN, TIBC, IRON, RETICCTPCT in the last 72 hours. Urine analysis:    Component Value Date/Time   COLORURINE STRAW (A) 03/04/2024 2139   APPEARANCEUR CLEAR 03/04/2024 2139   LABSPEC 1.016 03/04/2024 2139   PHURINE 6.0 03/04/2024 2139  GLUCOSEU >=500 (A) 03/04/2024 2139   HGBUR SMALL (A) 03/04/2024 2139   BILIRUBINUR NEGATIVE 03/04/2024 2139   KETONESUR NEGATIVE 03/04/2024 2139   PROTEINUR 30 (A) 03/04/2024 2139   UROBILINOGEN 0.2 05/05/2015 1443   NITRITE NEGATIVE 03/04/2024 2139   LEUKOCYTESUR NEGATIVE 03/04/2024 2139   Sepsis Labs: Invalid input(s): PROCALCITONIN, LACTICIDVEN  Microbiology: No results found for this or any previous visit (from the past 240 hours).  Radiology Studies: No results found.  Scheduled Meds:  (feeding supplement) PROSource Plus  30 mL Oral BID BM   amiodarone   200 mg Oral Daily   apixaban   5 mg Oral BID   atorvastatin   10 mg Oral Daily   collagenase    Topical Daily   diclofenac  Sodium  4 g Topical QID   feeding supplement  237 mL Oral BID BM   ferrous sulfate   325 mg Oral QHS   fluticasone   1 spray Each Nare Daily   furosemide   20 mg Oral Daily   insulin  aspart  0-9 Units  Subcutaneous TID WC   levothyroxine   50 mcg Oral Q0600   lidocaine   1 patch Transdermal Q24H   liver oil-zinc  oxide   Topical BID   loratadine   10 mg Oral Daily   multivitamin with minerals  1 tablet Oral Daily   nutrition supplement (JUVEN)  1 packet Oral BID BM   mouth rinse  15 mL Mouth Rinse 4 times per day   pantoprazole   40 mg Oral Daily   polyethylene glycol  17 g Oral BID   senna-docusate  1 tablet Oral QHS   sertraline   50 mg Oral Daily   sodium chloride  flush  10-40 mL Intracatheter Q12H   Tafamidis   1 capsule Oral Daily   vitamin A   10,000 Units Oral Daily   vutrisiran  sodium  25 mg Subcutaneous Q90 days   Continuous Infusions:   LOS: 150 days   27 minutes  Jai-Gurmukh Susannah Carbin, MD [facility] [email or web address] 08/01/2024, 4:54 PM

## 2024-08-01 NOTE — TOC Progression Note (Signed)
 Transition of Care [facility]) - Progression Note    Patient Details  Name: Earl [name] MRN: [id number] Date of Birth: Jan 19, 1941  Transition of Care [facility] CM/SW Contact  Adalae Baysinger A [place], LCSW Phone Number: 08/01/2024, 2:37 PM  Clinical Narrative:     CSW spoke with pt's daughter Para. CSW explained purpose and need for FL2 and also expressed role Inpatient of Care Management Department and necessity of disposition plan goals and need for FL2 to support that purpose. She stated understanding of form. No other questions asked. Inpatient Care Management will continue to follow.   Expected Discharge Plan: Skilled Nursing Facility Barriers to Discharge: Continued Medical Work up               Expected Discharge Plan and Services In-house Referral: Clinical Social Work     Living arrangements for the past 2 months:  (from [facility] short term rehab)                                       Social Drivers of Health (SDOH) Interventions SDOH Screenings   Food Insecurity: No Food Insecurity (03/05/2024)  Housing: Low Risk  (03/05/2024)  Transportation Needs: No Transportation Needs (03/05/2024)  Utilities: Not At Risk (03/05/2024)  Depression (PHQ2-9): Low Risk  (10/03/2023)  Financial Resource Strain: Patient Declined (01/16/2024)  Physical Activity: Unknown (01/16/2024)  Social Connections: Moderately Integrated (03/05/2024)  Stress: Stress Concern Present (01/16/2024)  Tobacco Use: Low Risk  (07/04/2024)    Readmission Risk Interventions    03/19/2024   10:24 AM  Readmission Risk Prevention Plan  Transportation Screening Complete  Medication Review (RN Care Manager) Complete  PCP or Specialist appointment within 3-5 days of discharge Complete  HRI or Home Care Consult Complete  SW Recovery Care/Counseling Consult Complete  Palliative Care Screening Complete  Skilled Nursing Facility Complete

## 2024-08-01 NOTE — Plan of Care (Signed)
  Problem: Clinical Measurements: Goal: Diagnostic test results will improve Outcome: Progressing   Problem: Metabolic: Goal: Ability to maintain appropriate glucose levels will improve Outcome: Progressing   Problem: Education: Goal: Knowledge of General Education information will improve Description: Including pain rating scale, medication(s)/side effects and non-pharmacologic comfort measures Outcome: Progressing   Problem: Nutrition: Goal: Adequate nutrition will be maintained Outcome: Progressing   Problem: Elimination: Goal: Will not experience complications related to bowel motility Outcome: Progressing Goal: Will not experience complications related to urinary retention Outcome: Progressing   Problem: Pain Managment: Goal: General experience of comfort will improve and/or be controlled Outcome: Progressing   Problem: Pain Managment: Goal: General experience of comfort will improve and/or be controlled Outcome: Progressing   Problem: Safety: Goal: Ability to remain free from injury will improve Outcome: Progressing

## 2024-08-02 DIAGNOSIS — A419 Sepsis, unspecified organism: Secondary | ICD-10-CM | POA: Diagnosis not present

## 2024-08-02 DIAGNOSIS — R6520 Severe sepsis without septic shock: Secondary | ICD-10-CM | POA: Diagnosis not present

## 2024-08-02 LAB — CBC WITH DIFFERENTIAL/PLATELET
Abs Immature Granulocytes: 0.04 K/uL (ref 0.00–0.07)
Basophils Absolute: 0 K/uL (ref 0.0–0.1)
Basophils Relative: 1 %
Eosinophils Absolute: 0.1 K/uL (ref 0.0–0.5)
Eosinophils Relative: 1 %
HCT: 31.8 % — ABNORMAL LOW (ref 39.0–52.0)
Hemoglobin: 10.6 g/dL — ABNORMAL LOW (ref 13.0–17.0)
Immature Granulocytes: 1 %
Lymphocytes Relative: 16 %
Lymphs Abs: 1.2 K/uL (ref 0.7–4.0)
MCH: 29.8 pg (ref 26.0–34.0)
MCHC: 33.3 g/dL (ref 30.0–36.0)
MCV: 89.3 fL (ref 80.0–100.0)
Monocytes Absolute: 1 K/uL (ref 0.1–1.0)
Monocytes Relative: 13 %
Neutro Abs: 5.2 K/uL (ref 1.7–7.7)
Neutrophils Relative %: 68 %
Platelets: 244 K/uL (ref 150–400)
RBC: 3.56 MIL/uL — ABNORMAL LOW (ref 4.22–5.81)
RDW: 20.2 % — ABNORMAL HIGH (ref 11.5–15.5)
WBC: 7.6 K/uL (ref 4.0–10.5)
nRBC: 0 % (ref 0.0–0.2)

## 2024-08-02 LAB — COMPREHENSIVE METABOLIC PANEL WITH GFR
ALT: 22 U/L (ref 0–44)
AST: 39 U/L (ref 15–41)
Albumin: 2.9 g/dL — ABNORMAL LOW (ref 3.5–5.0)
Alkaline Phosphatase: 252 U/L — ABNORMAL HIGH (ref 38–126)
Anion gap: 15 (ref 5–15)
BUN: 25 mg/dL — ABNORMAL HIGH (ref 8–23)
CO2: 23 mmol/L (ref 22–32)
Calcium: 8.2 mg/dL — ABNORMAL LOW (ref 8.9–10.3)
Chloride: 103 mmol/L (ref 98–111)
Creatinine, Ser: 0.81 mg/dL (ref 0.61–1.24)
GFR, Estimated: >60 mL/min (ref >60)
Glucose, Bld: 137 mg/dL — ABNORMAL HIGH (ref 70–99)
Potassium: 4.7 mmol/L (ref 3.5–5.1)
Sodium: 141 mmol/L (ref 135–145)
Total Bilirubin: 2.7 mg/dL — ABNORMAL HIGH (ref 0.0–1.2)
Total Protein: 5.9 g/dL — ABNORMAL LOW (ref 6.5–8.1)

## 2024-08-02 LAB — GLUCOSE, CAPILLARY
Glucose-Capillary: 117 mg/dL — ABNORMAL HIGH (ref 70–99)
Glucose-Capillary: 151 mg/dL — ABNORMAL HIGH (ref 70–99)
Glucose-Capillary: 121 mg/dL — ABNORMAL HIGH (ref 70–99)
Glucose-Capillary: 172 mg/dL — ABNORMAL HIGH (ref 70–99)

## 2024-08-02 MED ORDER — FLEET ENEMA RE ENEM
1.0000 | ENEMA | RECTAL | Status: DC
  Administered 2024-08-04 – 2024-08-14 (×4): 1 via RECTAL
  Filled 2024-08-02 (×8): qty 1

## 2024-08-02 MED ORDER — SORBITOL 70 % SOLN
15.0000 mL | Freq: Once | Status: AC
  Administered 2024-08-02: 15 mL via ORAL
  Filled 2024-08-02: qty 30

## 2024-08-02 MED ORDER — SORBITOL 70 % SOLN
30.0000 mL | Freq: Every day | Status: DC
  Administered 2024-08-03 – 2024-08-10 (×6): 30 mL via ORAL
  Filled 2024-08-02 (×8): qty 30

## 2024-08-02 NOTE — Progress Notes (Addendum)
 Mobility Specialist: Progress Note   08/02/24 1500  Mobility  Activity  (HEP)  Range of Motion/Exercises Active;Active Assistive;All extremities  Activity Response Tolerated well  Mobility Referral Yes  Mobility visit 1 Mobility  Mobility Specialist Start Time (ACUTE ONLY) 1133  Mobility Specialist Stop Time (ACUTE ONLY) 1205  Mobility Specialist Time Calculation (min) (ACUTE ONLY) 32 min    Pt received in bed, agreeable to mobility session. Performed UE and LE exercises with therabands, tolerated well. Left in bed with all needs met, call bell in reach.    UE (with theraband): front raises, crossbody punches (2 sets, 15x each), assisted sit-ups with gait belt (2 sets, 5x each) LE: bicycles (3 rounds, 32s each)  Earl [name] Mobility Specialist Please contact via SecureChat or Rehab office at [phone]

## 2024-08-02 NOTE — Plan of Care (Signed)
  Problem: Fluid Volume: Goal: Hemodynamic stability will improve Outcome: Progressing   Problem: Clinical Measurements: Goal: Diagnostic test results will improve Outcome: Progressing Goal: Signs and symptoms of infection will decrease Outcome: Progressing   Problem: Education: Goal: Knowledge of risk factors and measures for prevention of condition will improve Outcome: Progressing   Problem: Coping: Goal: Psychosocial and spiritual needs will be supported Outcome: Progressing   Problem: Respiratory: Goal: Will maintain a patent airway Outcome: Progressing Goal: Complications related to the disease process, condition or treatment will be avoided or minimized Outcome: Progressing   Problem: Education: Goal: Ability to describe self-care measures that may prevent or decrease complications (Diabetes Survival Skills Education) will improve Outcome: Progressing   Problem: Coping: Goal: Ability to adjust to condition or change in health will improve Outcome: Progressing   Problem: Fluid Volume: Goal: Ability to maintain a balanced intake and output will improve Outcome: Progressing   Problem: Health Behavior/Discharge Planning: Goal: Ability to manage health-related needs will improve Outcome: Progressing   Problem: Metabolic: Goal: Ability to maintain appropriate glucose levels will improve Outcome: Progressing   Problem: Nutritional: Goal: Maintenance of adequate nutrition will improve Outcome: Progressing Goal: Progress toward achieving an optimal weight will improve Outcome: Progressing   Problem: Skin Integrity: Goal: Risk for impaired skin integrity will decrease Outcome: Progressing   Problem: Tissue Perfusion: Goal: Adequacy of tissue perfusion will improve Outcome: Progressing   Problem: Education: Goal: Knowledge of General Education information will improve Description: Including pain rating scale, medication(s)/side effects and non-pharmacologic  comfort measures Outcome: Progressing   Problem: Health Behavior/Discharge Planning: Goal: Ability to manage health-related needs will improve Outcome: Progressing   Problem: Clinical Measurements: Goal: Ability to maintain clinical measurements within normal limits will improve Outcome: Progressing Goal: Will remain free from infection Outcome: Progressing Goal: Diagnostic test results will improve Outcome: Progressing Goal: Respiratory complications will improve Outcome: Progressing Goal: Cardiovascular complication will be avoided Outcome: Progressing   Problem: Activity: Goal: Risk for activity intolerance will decrease Outcome: Progressing   Problem: Nutrition: Goal: Adequate nutrition will be maintained Outcome: Progressing   Problem: Coping: Goal: Level of anxiety will decrease Outcome: Progressing   Problem: Elimination: Goal: Will not experience complications related to bowel motility Outcome: Progressing Goal: Will not experience complications related to urinary retention Outcome: Progressing   Problem: Pain Managment: Goal: General experience of comfort will improve and/or be controlled Outcome: Progressing   Problem: Safety: Goal: Ability to remain free from injury will improve Outcome: Progressing   Problem: Skin Integrity: Goal: Risk for impaired skin integrity will decrease Outcome: Progressing

## 2024-08-02 NOTE — Progress Notes (Signed)
 PROGRESS NOTE  Earl [name]  FMW:[id number] DOB: June 17, 1941 DOA: 03/04/2024 PCP: [name], MD  Consultants  Brief Narrative: Earl [name] is an 83 year old M with PMH of PAF on Eliquis , HFmrEF, amyloid cardiomyopathy, NIDDM-2, CVA with right-sided weakness, stage IV sacral wound, pancytopenia, MGUS, HTN, HLD and hypothyroidism brought to ED by EMS from [facility] rehab due to pain in sacral area and shortness of breath 4 days after he was discharged from the hospital.  Patient was hospitalized from 3/26-4/10 with generalized weakness, encephalopathy and COVID-19 infection. Patient came back with skin injury suffered during previous hospitalization, and he had significant pain, discomfort and progressive foul-smelling discharge from sacral wound over the course of 4 to 5 days before coming back to ED. he was admitted due to persistent wounds.  General surgery, orthopedic surgery, ID, palliative all consulted.  Patient is in the hospital for prolonged time.    Significant imaging / results / micro data: 4/14-COVID-19 PCR positive.  Flu and RSV PCR nonreactive 4/14-blood cultures NGTD 6/4-sacral wound culture with Pseudomonas aeruginosa and Klebsiella pneumoniae 7/14-pleural fluid culture NGTD   Subjective:  Looks like was disimpacted significantly overnight 9/11 with good effect and a lot of stool coming out Abdomen is less painful no guarding  Assessment & Plan:   Principal Problem:   Severe sepsis (HCC) Active Problems:   Paroxysmal atrial fibrillation (HCC)   Sepsis (HCC)   Atrial flutter (HCC)   Sacral ulcer (HCC)   MGUS (monoclonal gammopathy of unknown significance)   Essential hypertension   Hyperlipidemia   Insulin  dependent type 2 diabetes mellitus (HCC)   History of CVA (cerebrovascular accident)   Cardiac amyloidosis (HCC)   Chronic systolic CHF (congestive heart failure) (HCC)   GERD (gastroesophageal reflux disease)   History of hypertension    Atrial fibrillation with RVR (HCC)   Community acquired pneumonia   Protein-calorie malnutrition, severe   Wild-type transthyretin-related (ATTR) amyloidosis (HCC)   Sacral decubitus ulcer, stage IV (HCC)   Pressure injury of skin of trochanteric region of left hip    Stage IV sacral decubitus wound/ulcer with severe sepsis on admission: Treated as below. - Culture data as above - 4/14-CT showed sacral wound extension to the right gluteus muscle without signs of osteomyelitis. - 4/15-underwent bedside debridement of sacral decubitus wound by general surgery - 4/21-ID consulted and recommended continuing broad-spectrum antibiotics that he completed on 6/13. 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 - 6/17- ID recommended continuing holding off antibiotics since he is hemodynamically stable, afebrile without leukocytosis. - 6/17-evaluated by plastic surgery.  Sacral wound felt to be clean.  Wound care recommended - 7/13-7/13-CT showed similar appearance of osteomyelitis underlying sacral decubitus ulcer.  - 7/13-plastic surgery recommended for continued wound care and no surgical management. - 7/14-Ortho evaluated patient for left hip wound.  Debulked and recommended optimizing nutrition - 8/5- Surgery recommended hydrotherapy for left hip wound. Surgical debridement if not successful - 8/5-wound care and instruction updated  - 8/15-left hip wound worse with tunneling. X-ray with left lateral hip soft tissue defect.  No acute bony abnormality.  -Overall, difficult case due to patient's nutritional status and limited mobility.  Prealbumin <5. - 8/15 with CT showed left hip wound which extends deep, 2.8 cm without evidence of acute osteolysis involving the underlying left greater trochanter.   - 07/12/2024: Ortho re-consult-- conservative management.  Continue wound care as per wound care consult recommendations. -Wound vac to hip  changed on 9/11-- Routine,  Daily, First occurrence on Tue 07/30/24 at 0500 1- Left shoulder Unstageable Cleanse with NS, pat dry, Apply 1/4 thick layer of Santyl  to wound bed, top with saline moist gauze. Top with silicone foam. Change daily. 2- Left trochanter: NPWT WOC will change 2x weekly 3 - Sacral/ L buttocks: Cleanse with NS. Apply 1/4 thick layer of Santyl  to wound bed, top with saline moist gauze. Top with silicone foam. Change daily. 3- Right heel; Unstageable; Cleanse with NS, pat dry. Apply Aquacel Ag+ (silver hydrofiber) Lawson # [id number] and cover with silicone foam dressing. Change daily. Off load bilateral heels with Prevalon boots at all times. LALM in place for moisture management and pressure redistribution.  Severe consitp -disimpacted 9/11--scheduled sorbitol --PRN Soap suds Vs [name] enema if no stool in 48 hours despite Sorbitol   Aspiration pneumonia  - Patient with worsening congestion, increased cough on 8/18.   - Chest x-ray 8/18 with lower lobes collapsed ? pneumonia, likely due to being in bed for prolonged amount of time.   - Completed 5 days of Rocephin  and Flagyl  on 07/12/2024. - Diet as per SLP's recommendations--Advanced to Reg solids, thin liquids--he will have intermittent cough with eating as a generality per them  - Remains at risk for aspiration speech signed off 9/8   Acute on chronic combined CHF, cardiac amyloidosis, nonischemic cardiomyopathy, hypotension  - On tafamidis  and vutrisiran  for amyloid.  G DMT limited 2/2 prior episodes of hypotension.   - Has received few doses of IV Lasix  recently along with IV albumin .  Continue Lasix  20 p.o. daily - Continue Tafamidis  and vutrisiran . - Ace wrap for BLE edema, also in Prevalon boots - periodic checks of kidney function and electrolytes, stable now.   Elevated LFTs  - Lipitor reintroduced.  LFTs show slight elevaiton alk phos--likely bony remodelling   PAF - Continue amiodarone  200 daily-and Eliquis  5 twice  daily.  Currently rate controlled.     History of CVA - Stable, on Eliquis  5 bid, back on Lipitor 10.   Dysphagia/severe protein calorie malnutrition - Continue supplementation, currently on regular diet but does remain with aspiration concerns.  All-time aspiration precautions--- daughter is very diligent-- nursing has been sensitized to keeping him sitting up   Physical debilitation, muscle weakness - PT/OT ongoing.  Challenging with getting debilitated every day.   Hypothyroidism  -Continue levothyroxine  50 mcg   IDDM  - Continue sliding scaleonly .  Hypoglycemic episode 9/5 night.  On sliding scale insulin .  No more on long-acting insulin .  Encourage nutrition.  Goals of care  - Daughter wants him to transition to home once his wounds are better.   - Progressive pictures of wounds show a new area on the right upper buttock  - Daughter does not want SNF or LTAC.  Palliative care  Dr. Marvine saw the patient in consult on 911 and had a good visit and they will revisit discussion making with regards to complex goals of care on 9/16   Resolved/chronic issues Severe sepsis  - Present on admission.  Resolved. Anxiety and adjustment disorder  - Psychiatry consulted on 7/7 and recommended continuing Zoloft  50 daily.  Psychiatry has signed off. Anemia of chronic disease - From chronic illnesses.  Monitor intermittently.  No signs of bleeding. Ferrous sulphate changed to q0d [can constipate patients] Acute on chronic pain - Continue current pain management with lidocaine  patch and as needed oxycodone  2.5 3 times daily as needed Concern for cholecystitis-unfounded - Ruled out, right upper quadrant ultrasound initially raise  concern, however HIDA scan was negative.  This is likely due to anasarca/third spacing.     DVT prophylaxis:  SCDs Start: 03/04/24 2151 Place TED hose Start: 03/04/24 2151 apixaban  (ELIQUIS ) tablet 5 mg  Code Status:   Code Status: Limited: Do not attempt  resuscitation (DNR) -DNR-LIMITED -Do Not Intubate/DNI  Level of care: Telemetry Medical Status is: Inpatient   Objective: Vitals:   08/01/24 2357 08/02/24 0432 08/02/24 1007 08/02/24 1543  BP:  (!) 98/56 96/63 95/66   Pulse: 79 70 71 70  Resp:   20 20  Temp:  97.7 F (36.5 C) 97.6 F (36.4 C) 98.1 F (36.7 C)  TempSrc:    Oral  SpO2: 97% 93% 98% 98%  Weight:      Height:        Intake/Output Summary (Last 24 hours) at 08/02/2024 1824 Last data filed at 08/02/2024 1800 Gross per 24 hour  Intake 280 ml  Output 1750 ml  Net -1470 ml   Filed Weights   07/18/24 0205 07/19/24 0500 08/01/24 0500  Weight: 69.4 kg 69.4 kg 70.8 kg   Body mass index is 21.76 kg/m.  Examination:   Awake coherent oriented debilitated cachectic S1-S2 no murmur Chest is clear  I did not examine wounds today Abdomen soft no rebound no guarding ROM is intact  I have personally reviewed the following labs and images: CBC: Recent Labs  Lab 07/28/24 0202 08/01/24 0536 08/02/24 0534  WBC 6.2 5.3 7.6  NEUTROABS  --  3.0 5.2  HGB 10.8* 10.5* 10.6*  HCT 31.7* 31.3* 31.8*  MCV 87.6 87.9 89.3  PLT 237 234 244   BMP &GFR Recent Labs  Lab 07/28/24 0202 08/01/24 0536 08/02/24 0534  NA 138 140 141  K 4.3 4.1 4.7  CL 104 104 103  CO2 24 25 23   GLUCOSE 154* 110* 137*  BUN 32* 23 25*  CREATININE 0.92 0.79 0.81  CALCIUM  8.5* 8.4* 8.2*   Estimated Creatinine Clearance: 69.2 mL/min (by C-G formula based on SCr of 0.81 mg/dL). Liver & Pancreas: Recent Labs  Lab 07/28/24 0202 08/01/24 0536 08/02/24 0534  AST 34 33 39  ALT 23 21 22   ALKPHOS 221* 240* 252*  BILITOT 2.1* 1.9* 2.7*  PROT 5.6* 5.4* 5.9*  ALBUMIN  2.4* 2.3* 2.9*    Diabetic: No results for input(s): HGBA1C in the last 72 hours. Recent Labs  Lab 08/01/24 1601 08/01/24 2207 08/02/24 0752 08/02/24 1117 08/02/24 1550  GLUCAP 155* 129* 117* 151* 121*    Urine analysis:    Component Value Date/Time   COLORURINE  STRAW (A) 03/04/2024 2139   APPEARANCEUR CLEAR 03/04/2024 2139   LABSPEC 1.016 03/04/2024 2139   PHURINE 6.0 03/04/2024 2139   GLUCOSEU >=500 (A) 03/04/2024 2139   HGBUR SMALL (A) 03/04/2024 2139   BILIRUBINUR NEGATIVE 03/04/2024 2139   KETONESUR NEGATIVE 03/04/2024 2139   PROTEINUR 30 (A) 03/04/2024 2139   UROBILINOGEN 0.2 05/05/2015 1443   NITRITE NEGATIVE 03/04/2024 2139   LEUKOCYTESUR NEGATIVE 03/04/2024 2139     Scheduled Meds:  (feeding supplement) PROSource Plus  30 mL Oral BID BM   amiodarone   200 mg Oral Daily   apixaban   5 mg Oral BID   atorvastatin   10 mg Oral Daily   collagenase    Topical Daily   diclofenac  Sodium  4 g Topical QID   feeding supplement  237 mL Oral BID BM   ferrous sulfate   325 mg Oral QODAY   fluticasone   1 spray Each Nare  Daily   furosemide   20 mg Oral Daily   insulin  aspart  0-9 Units Subcutaneous TID WC   levothyroxine   50 mcg Oral Q0600   lidocaine   1 patch Transdermal Q24H   liver oil-zinc  oxide   Topical BID   loratadine   10 mg Oral Daily   multivitamin with minerals  1 tablet Oral Daily   nutrition supplement (JUVEN)  1 packet Oral BID BM   mouth rinse  15 mL Mouth Rinse 4 times per day   pantoprazole   40 mg Oral Daily   sertraline   50 mg Oral Daily   sodium chloride  flush  10-40 mL Intracatheter Q12H   [START ON 08/04/2024] sodium phosphate   1 enema Rectal QODAY   [START ON 08/03/2024] sorbitol   30 mL Oral Daily   Tafamidis   1 capsule Oral Daily   vitamin A   10,000 Units Oral Daily   vutrisiran  sodium  25 mg Subcutaneous Q90 days   Continuous Infusions:   LOS: 151 days   27 minutes  Earl Jacklynn Dehaas, MD [facility] [email or web address] 08/02/2024, 6:24 PM

## 2024-08-02 NOTE — Progress Notes (Signed)
 Palliative Care Follow-Up/Goals of Care Meeting   Patient Narrative:  83 year old man with multiple complex medical issues including cardiac amyloidosis, pulmonary hypertension history of stroke, MGUS, hypothyroidism and hypertension who has had a prolonged hospitalization secondary to worsening wounds related to pressure injuries. He had a hospitalization between 326 and 410 with COVID-19 infection and encephalopathy.Readnitted after only 3 days at [facility] on 4/14 with progressive wound infection and sepsis. He is requiring complex wound care including hydrotherapy, wound vac and frequent dressing changes. The complexity of his care has been above what skilled facilities can provide and he has not been accepted by [facility], his daughter is primary decision maker, along with the patient himself.   Assessment:  Pain, Complex Wounds, Deconditioning, Protein Calorie Malnutrition: Prognostically the wounds are extensive, they are not showing evidence of healing, but not also getting significantly worse either per chart review, no osteomyelitis on imaging. His albumin  is 2.3, very low and he is severely deconditioned. Unlikely that these wounds will heal in his current condition. Juven, intermittent IV albumin , nutrition advanced wound therapies being addressed. He has had issues with chronic wounds on his sacrum, buttock and heel since 10/2023.  Severe Constipation No Bowel movement in almost a week, patient was grimacing during encounter and appeared to be in pain when I asked him to describe the pain he said it felt like he had a rock in his rectum. He says he cant have a a BM and that it is too painful to push. I did a rectal exam and felt a large fecal impaction, removed large amount of hard stool and requested RN administer the PRN SSE following distal disimpaction. Patient expressed relief following bowel movement. He likely needs daily stool softener, magnesium  supplementation, hydration and  mobility. He is on a very low dose of oxycodone  daily-doubt this is contributing but could consider Relistor dose if this occurs again.  Goals of Care: Discussion had at bedside and support provided to both patient and his daughter Para. I allowed for time and space for her to share details of his hospitalization, complications and talk about her fears regarding his wounds and his care requirements moving forward.  Prior to last hospitalization, patient was living at home alone but declining needing more more assistance in order to achieve that safely.  His daughter did not live with him but tween she and her brother they checked on him very frequently at least daily.   It seems the major goal that both the patient and his daughter would like to achieve his to be able to return to his home with the proper level of care, he understands that he would likely need 24/7 around-the-clock care in order to be there safely and realizes that this would have to be private pay and they do not have the resources to do this.  His wounds are at a point where they need also need advanced therapies which she feels like can best be done in the hospital versus another setting.  Both the patient and his daughter desperately want these wounds to heal and are willing to do what ever is necessary in order to achieve that, we discussed maximizing medical interventions that we are currently doing and assessing the outcome of that.  So far even with expert intervention for these wounds they have not shown clear evidence of healing, he has had issues with low protein and peripheral edema which also makes wound healing much more difficult.  He has also struggled to  maintain his nutritional requirements.  Although this is improved he is now eating a regular diet but needs significant assistance.  His mobility has also been extremely low he is no longer seen by acute physical therapy but mobility specialist is seeing him and trying to  mobilize him to the degree that he is able he tells me that he would like to get out of the bed into a chair but does not want to sit there all day or 4 hours when no one will put him back in the bed he says this is extremely painful and so he has been reluctant to get out of the bed.  He endorses that this hospitalization is getting more difficult for him to tolerate he has not been outside or been able to enjoy the things that he loves about being home and having his independence back.  The patient tells me that he does understand everything that I am sharing with him and feels like he is doing everything that the nurses and doctors tell him to do and is getting understandably discouraged.  His daughter Eugune is a fierce advocate for making sure that he has personalized and high level of care provided.  His daughter understands the complexities of his medical issues she also understands how fragile he is and that he is likely continuing to decline and may in fact be approaching the end of his life.  We discussed his prognosis and how we may be able to help improve his quality of life including pain control, location of where he receives his care, improve his mobility and other things that are important to him in the context of preserving his dignity and for the sake of comfort since he is approaching the end of his life which the majority of the medical team and myself believed to be true. [name] understands this and Para- they understand his body is declining and failing -but in their hearts they want more time and for him to get back to or close to where he was before-it has been nearly a year of trying to achieve this and we discussed what loss of function has been like for him and may be like moving forward. They are afraid of the hospice word-Para tells me their mother had hospice and that her father knows what this means. When I asked him, he said he understood everything that I said. I encouraged them to  talk with each other and to think about an alternative more palliative option to ongoing aggressive, even painful medical interventions for condition that will not improve significantly or may even be permanent-like muscle loss, immobility contractures and chronic wounds.  I shared with them my opinion that the only way he would likely ever be able to go home would be in a situation in which he was going there to spend his remaining time in comfort and dignity- it would include hospice care and caregivers, not returning to the hospital or aggressively treating acute issues when they occur unless doing so would contribute to his comfort. The focus would shift from diagnostics and curative attempts to quality of life and comfort. Either way we will continue to care for him, provide wound care and medications that are helpful to him.  Recommendations: I plan to meet with patient and daughter again on Tuesday evening- her job requires that she works nights so she has been coming in evenings when she is off to see him.She requested his meds be given at  8PM instead of 10PM for evening doses. Will continue to support and work through options for his care when they are available. Patient is able to participate and provide guidance also.Needs aggressive bowel regimen. Ultimately the daughter desires for him to discharge home with in home caregivers and high level of nursing care-this is likely not possible without substantial resources-however if open to hospice we could get him home with a more palliative focus and for EOL with clear treatment guidelines to mange his wounds and pain. Consider limitations on aggressive medical interventions if he acutely decompensates or declines further-at present he is stable, his PO intake is improving. Wounds remain complex and very challenging to manage outside of acute care setting.  Almarie General, DO Palliative Medicine

## 2024-08-02 NOTE — Progress Notes (Signed)
 PROGRESS NOTE  Earl [name]  FMW:[id number] DOB: 1940-12-21 DOA: 03/04/2024 PCP: [name], MD  Consultants  Brief Narrative: Earl [name] is an 83 year old M with PMH of PAF on Eliquis , HFmrEF, amyloid cardiomyopathy, NIDDM-2, CVA with right-sided weakness, stage IV sacral wound, pancytopenia, MGUS, HTN, HLD and hypothyroidism brought to ED by EMS from [facility] rehab due to pain in sacral area and shortness of breath 4 days after he was discharged from the hospital.  Patient was hospitalized from 3/26-4/10 with generalized weakness, encephalopathy and COVID-19 infection. Patient came back with skin injury suffered during previous hospitalization, and he had significant pain, discomfort and progressive foul-smelling discharge from sacral wound over the course of 4 to 5 days before coming back to ED. he was admitted due to persistent wounds.  General surgery, orthopedic surgery, ID, palliative all consulted.  Patient is in the hospital for prolonged time.    Significant imaging / results / micro data: 4/14-COVID-19 PCR positive.  Flu and RSV PCR nonreactive 4/14-blood cultures NGTD 6/4-sacral wound culture with Pseudomonas aeruginosa and Klebsiella pneumoniae 7/14-pleural fluid culture NGTD   Subjective:  Looks like was disimpacted significantly overnight 9/11 with good effect and a lot of stool coming out Abdomen is less painful no guarding  Assessment & Plan:   Principal Problem:   Severe sepsis (HCC) Active Problems:   Paroxysmal atrial fibrillation (HCC)   Sepsis (HCC)   Atrial flutter (HCC)   Sacral ulcer (HCC)   MGUS (monoclonal gammopathy of unknown significance)   Essential hypertension   Hyperlipidemia   Insulin  dependent type 2 diabetes mellitus (HCC)   History of CVA (cerebrovascular accident)   Cardiac amyloidosis (HCC)   Chronic systolic CHF (congestive heart failure) (HCC)   GERD (gastroesophageal reflux disease)   History of hypertension    Atrial fibrillation with RVR (HCC)   Community acquired pneumonia   Protein-calorie malnutrition, severe   Wild-type transthyretin-related (ATTR) amyloidosis (HCC)   Sacral decubitus ulcer, stage IV (HCC)   Pressure injury of skin of trochanteric region of left hip    Stage IV sacral decubitus wound/ulcer with severe sepsis on admission: Treated as below. - Culture data as above - 4/14-CT showed sacral wound extension to the right gluteus muscle without signs of osteomyelitis. - 4/15-underwent bedside debridement of sacral decubitus wound by general surgery - 4/21-ID consulted and recommended continuing broad-spectrum antibiotics that he completed on 6/13. 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 - 6/17- ID recommended continuing holding off antibiotics since he is hemodynamically stable, afebrile without leukocytosis. - 6/17-evaluated by plastic surgery.  Sacral wound felt to be clean.  Wound care recommended - 7/13-7/13-CT showed similar appearance of osteomyelitis underlying sacral decubitus ulcer.  - 7/13-plastic surgery recommended for continued wound care and no surgical management. - 7/14-Ortho evaluated patient for left hip wound.  Debulked and recommended optimizing nutrition - 8/5- Surgery recommended hydrotherapy for left hip wound. Surgical debridement if not successful - 8/5-wound care and instruction updated  - 8/15-left hip wound worse with tunneling. X-ray with left lateral hip soft tissue defect.  No acute bony abnormality.  -Overall, difficult case due to patient's nutritional status and limited mobility.  Prealbumin <5. - CT scan of the hip 8/15 with new left hip wound which extends deep, 2.8 cm without evidence of acute osteolysis involving the underlying left greater trochanter.  Ortho was reconsulted on 07/12/2024: Recommended conservative management.  Continue wound care as per wound care consult  recommendations. -Wound vac to  hip changed on 9/11--will discuss implications with WOC rn and follow up with  Routine, Daily, First occurrence on Tue 07/30/24 at 0500 1- Left shoulder Unstageable Cleanse with NS, pat dry, Apply 1/4 thick layer of Santyl  to wound bed, top with saline moist gauze. Top with silicone foam. Change daily. 2- Left trochanter: NPWT WOC will change 2x weekly 3 - Sacral/ L buttocks: Cleanse with NS. Apply 1/4 thick layer of Santyl  to wound bed, top with saline moist gauze. Top with silicone foam. Change daily. 3- Right heel; Unstageable; Cleanse with NS, pat dry. Apply Aquacel Ag+ (silver hydrofiber) Lawson # [id number] and cover with silicone foam dressing. Change daily. Off load bilateral heels with Prevalon boots at all times. LALM in place for moisture management and pressure redistribution.  Severe sepsis  - Present on admission.  Resolved.   Aspiration pneumonia  - Patient with worsening congestion, increased cough on 8/18.   -Chest x-ray 8/18 with lower lobes collapsed ? pneumonia, likely due to being in bed for prolonged amount of time.   -Completed 5 days of Rocephin  and Flagyl  on 07/12/2024. - Diet as per SLP's recommendations--Advanced to Reg solids, thin liquids--he will have intermittent cough with eating as a generality per them  - Remains at risk for aspiration speech signed off 9/8   Acute on chronic combined CHF, cardiac amyloidosis, nonischemic cardiomyopathy, hypotension  - On tafamidis  and vutrisiran  for amyloid.  G DMT limited 2/2 prior episodes of hypotension.   - Has received few doses of IV Lasix  recently along with IV albumin .  Continue Lasix  20 p.o. daily - Continue Tafamidis  and vutrisiran . - Ace wrap for BLE edema, also in Prevalon boots - periodic checks of kidney function and electrolytes, stable now.   Elevated LFTs  - Lipitor reintroduced.  LFTs show slight elevaiton alk phos--likely bony remodelling   Acute on chronic pain - Continue  current pain management with lidocaine  patch and as needed oxycodone  2.5 3 times daily as needed - Greatly appreciate palliative help with this plan.     PAF - Continue amiodarone  200 daily-and Eliquis  5 twice daily.  Currently rate controlled.     Anemia of chronic disease - From chronic illnesses.  Monitor intermittently.  No signs of bleeding.    History of CVA - Stable, on Eliquis , back on Lipitor 10.   Concern for acute cholecystitis - Ruled out, right upper quadrant ultrasound initially raise concern, however HIDA scan was negative.  This is likely due to anasarca/third spacing.     Dysphagia/severe protein calorie malnutrition - Continue supplementation, currently on regular diet but does remain with aspiration concerns.  All-time aspiration precautions--- daughter is very diligent-- nursing has been sensitized to keeping him sitting up   Physical debilitation, muscle weakness - PT/OT ongoing.  Challenging with getting debilitated every day.   Hypothyroidism  -Continue levothyroxine    Anxiety and adjustment disorder  - Psychiatry consulted on 7/7 and recommended continuing Zoloft  50 daily.  Psychiatry has signed off.   IDDM  - Continue sliding scale.  Hypoglycemic episode 9/5 night.  On sliding scale insulin .  No more on long-acting insulin .  Encourage nutrition.  Goals of care  - Daughter wants him to transition to home once his wounds are better.   - Progressive pictures of wounds show a new area on the right upper buttock  - Daughter does not want SNF or LTAC.  Palliative care  Dr. Marvine saw the patient in consult on 911 and had a good visit  and they will revisit discussion making with regards to complex goals of care on 9/16     DVT prophylaxis:  SCDs Start: 03/04/24 2151 Place TED hose Start: 03/04/24 2151 apixaban  (ELIQUIS ) tablet 5 mg  Code Status:   Code Status: Limited: Do not attempt resuscitation (DNR) -DNR-LIMITED -Do Not Intubate/DNI  Level of care:  Telemetry Medical Status is: Inpatient   Objective: Vitals:   08/01/24 2357 08/02/24 0432 08/02/24 1007 08/02/24 1543  BP:  (!) 98/56 96/63 95/66   Pulse: 79 70 71 70  Resp:   20 20  Temp:  97.7 F (36.5 C) 97.6 F (36.4 C) 98.1 F (36.7 C)  TempSrc:    Oral  SpO2: 97% 93% 98% 98%  Weight:      Height:        Intake/Output Summary (Last 24 hours) at 08/02/2024 1745 Last data filed at 08/02/2024 1200 Gross per 24 hour  Intake 280 ml  Output 1700 ml  Net -1420 ml   Filed Weights   07/18/24 0205 07/19/24 0500 08/01/24 0500  Weight: 69.4 kg 69.4 kg 70.8 kg   Body mass index is 21.76 kg/m.  Examination:   Awake coherent oriented debilitated cachectic S1-S2 no murmur Chest is clear  I did not examine wounds today Abdomen soft no rebound no guarding ROM is intact  I have personally reviewed the following labs and images: CBC: Recent Labs  Lab 07/28/24 0202 08/01/24 0536 08/02/24 0534  WBC 6.2 5.3 7.6  NEUTROABS  --  3.0 5.2  HGB 10.8* 10.5* 10.6*  HCT 31.7* 31.3* 31.8*  MCV 87.6 87.9 89.3  PLT 237 234 244   BMP &GFR Recent Labs  Lab 07/28/24 0202 08/01/24 0536 08/02/24 0534  NA 138 140 141  K 4.3 4.1 4.7  CL 104 104 103  CO2 24 25 23   GLUCOSE 154* 110* 137*  BUN 32* 23 25*  CREATININE 0.92 0.79 0.81  CALCIUM  8.5* 8.4* 8.2*   Estimated Creatinine Clearance: 69.2 mL/min (by C-G formula based on SCr of 0.81 mg/dL). Liver & Pancreas: Recent Labs  Lab 07/28/24 0202 08/01/24 0536 08/02/24 0534  AST 34 33 39  ALT 23 21 22   ALKPHOS 221* 240* 252*  BILITOT 2.1* 1.9* 2.7*  PROT 5.6* 5.4* 5.9*  ALBUMIN  2.4* 2.3* 2.9*   No results for input(s): LIPASE, AMYLASE in the last 168 hours. No results for input(s): AMMONIA in the last 168 hours. Diabetic: No results for input(s): HGBA1C in the last 72 hours. Recent Labs  Lab 08/01/24 1601 08/01/24 2207 08/02/24 0752 08/02/24 1117 08/02/24 1550  GLUCAP 155* 129* 117* 151* 121*   Cardiac  Enzymes: No results for input(s): CKTOTAL, CKMB, CKMBINDEX, TROPONINI in the last 168 hours. No results for input(s): PROBNP in the last 8760 hours. Coagulation Profile: No results for input(s): INR, PROTIME in the last 168 hours. Thyroid  Function Tests: No results for input(s): TSH, T4TOTAL, FREET4, T3FREE, THYROIDAB in the last 72 hours. Lipid Profile: No results for input(s): CHOL, HDL, LDLCALC, TRIG, CHOLHDL, LDLDIRECT in the last 72 hours. Anemia Panel: No results for input(s): VITAMINB12, FOLATE, FERRITIN, TIBC, IRON, RETICCTPCT in the last 72 hours. Urine analysis:    Component Value Date/Time   COLORURINE STRAW (A) 03/04/2024 2139   APPEARANCEUR CLEAR 03/04/2024 2139   LABSPEC 1.016 03/04/2024 2139   PHURINE 6.0 03/04/2024 2139   GLUCOSEU >=500 (A) 03/04/2024 2139   HGBUR SMALL (A) 03/04/2024 2139   BILIRUBINUR NEGATIVE 03/04/2024 2139   KETONESUR NEGATIVE 03/04/2024 2139  PROTEINUR 30 (A) 03/04/2024 2139   UROBILINOGEN 0.2 05/05/2015 1443   NITRITE NEGATIVE 03/04/2024 2139   LEUKOCYTESUR NEGATIVE 03/04/2024 2139   Sepsis Labs: Invalid input(s): PROCALCITONIN, LACTICIDVEN  Microbiology: No results found for this or any previous visit (from the past 240 hours).  Radiology Studies: No results found.  Scheduled Meds:  (feeding supplement) PROSource Plus  30 mL Oral BID BM   amiodarone   200 mg Oral Daily   apixaban   5 mg Oral BID   atorvastatin   10 mg Oral Daily   collagenase    Topical Daily   diclofenac  Sodium  4 g Topical QID   feeding supplement  237 mL Oral BID BM   ferrous sulfate   325 mg Oral QODAY   fluticasone   1 spray Each Nare Daily   furosemide   20 mg Oral Daily   insulin  aspart  0-9 Units Subcutaneous TID WC   levothyroxine   50 mcg Oral Q0600   lidocaine   1 patch Transdermal Q24H   liver oil-zinc  oxide   Topical BID   loratadine   10 mg Oral Daily   multivitamin with minerals  1 tablet Oral Daily    nutrition supplement (JUVEN)  1 packet Oral BID BM   mouth rinse  15 mL Mouth Rinse 4 times per day   pantoprazole   40 mg Oral Daily   sertraline   50 mg Oral Daily   sodium chloride  flush  10-40 mL Intracatheter Q12H   sorbitol   15 mL Oral Daily   Tafamidis   1 capsule Oral Daily   vitamin A   10,000 Units Oral Daily   vutrisiran  sodium  25 mg Subcutaneous Q90 days   Continuous Infusions:   LOS: 151 days   27 minutes  Jai-Gurmukh Noach Calvillo, MD [facility] [email or web address] 08/02/2024, 5:45 PM

## 2024-08-03 DIAGNOSIS — A419 Sepsis, unspecified organism: Secondary | ICD-10-CM | POA: Diagnosis not present

## 2024-08-03 DIAGNOSIS — R6520 Severe sepsis without septic shock: Secondary | ICD-10-CM | POA: Diagnosis not present

## 2024-08-03 LAB — COMPREHENSIVE METABOLIC PANEL WITH GFR
ALT: 22 U/L (ref 0–44)
AST: 34 U/L (ref 15–41)
Albumin: 2.9 g/dL — ABNORMAL LOW (ref 3.5–5.0)
Alkaline Phosphatase: 242 U/L — ABNORMAL HIGH (ref 38–126)
Anion gap: 10 (ref 5–15)
BUN: 26 mg/dL — ABNORMAL HIGH (ref 8–23)
CO2: 26 mmol/L (ref 22–32)
Calcium: 8.8 mg/dL — ABNORMAL LOW (ref 8.9–10.3)
Chloride: 103 mmol/L (ref 98–111)
Creatinine, Ser: 0.83 mg/dL (ref 0.61–1.24)
GFR, Estimated: >60 mL/min (ref >60)
Glucose, Bld: 121 mg/dL — ABNORMAL HIGH (ref 70–99)
Potassium: 4.8 mmol/L (ref 3.5–5.1)
Sodium: 139 mmol/L (ref 135–145)
Total Bilirubin: 2.3 mg/dL — ABNORMAL HIGH (ref 0.0–1.2)
Total Protein: 5.9 g/dL — ABNORMAL LOW (ref 6.5–8.1)

## 2024-08-03 LAB — GLUCOSE, CAPILLARY
Glucose-Capillary: 106 mg/dL — ABNORMAL HIGH (ref 70–99)
Glucose-Capillary: 140 mg/dL — ABNORMAL HIGH (ref 70–99)
Glucose-Capillary: 151 mg/dL — ABNORMAL HIGH (ref 70–99)
Glucose-Capillary: 149 mg/dL — ABNORMAL HIGH (ref 70–99)

## 2024-08-03 LAB — CBC WITH DIFFERENTIAL/PLATELET
Abs Immature Granulocytes: 0.04 K/uL (ref 0.00–0.07)
Basophils Absolute: 0.1 K/uL (ref 0.0–0.1)
Basophils Relative: 1 %
Eosinophils Absolute: 0.2 K/uL (ref 0.0–0.5)
Eosinophils Relative: 3 %
HCT: 31.5 % — ABNORMAL LOW (ref 39.0–52.0)
Hemoglobin: 10.7 g/dL — ABNORMAL LOW (ref 13.0–17.0)
Immature Granulocytes: 1 %
Lymphocytes Relative: 20 %
Lymphs Abs: 1.2 K/uL (ref 0.7–4.0)
MCH: 30.1 pg (ref 26.0–34.0)
MCHC: 34 g/dL (ref 30.0–36.0)
MCV: 88.7 fL (ref 80.0–100.0)
Monocytes Absolute: 0.8 K/uL (ref 0.1–1.0)
Monocytes Relative: 14 %
Neutro Abs: 3.7 K/uL (ref 1.7–7.7)
Neutrophils Relative %: 61 %
Platelets: 239 K/uL (ref 150–400)
RBC: 3.55 MIL/uL — ABNORMAL LOW (ref 4.22–5.81)
RDW: 20.2 % — ABNORMAL HIGH (ref 11.5–15.5)
WBC: 6 K/uL (ref 4.0–10.5)
nRBC: 0 % (ref 0.0–0.2)

## 2024-08-03 MED ORDER — JUVEN PO PACK
1.0000 | PACK | Freq: Every day | ORAL | Status: DC
  Administered 2024-08-05 – 2024-08-21 (×17): 1 via ORAL
  Filled 2024-08-03 (×19): qty 1

## 2024-08-03 MED ORDER — JUVEN PO PACK
1.0000 | PACK | Freq: Every day | ORAL | Status: DC
  Administered 2024-08-04 – 2024-08-21 (×18): 1 via ORAL
  Filled 2024-08-03 (×18): qty 1

## 2024-08-03 NOTE — Progress Notes (Signed)
 Patient ate breakfast with assistance. Drank water and ate 50% meal. Head of bed at 45 degrees to help with aspiration risk. Had medication

## 2024-08-03 NOTE — Progress Notes (Signed)
 This RN as well as primary RN, Almarie, performed wound care per order around 1530 with Dr Royal. Wound vac canister exchanged and is functioning properly. Wound care tolerated well with pictures updated in chart. Patient's daughter, Murrell, also at bedside and visualized wounds with staff.

## 2024-08-03 NOTE — Progress Notes (Signed)
 PROGRESS NOTE  Earl [name]  FMW:[id number] DOB: December 17, 1940 DOA: 03/04/2024 PCP: [name], MD  Consultants  Brief Narrative: Earl [name] is an 83 year old M with PMH of PAF on Eliquis , HFmrEF, amyloid cardiomyopathy, NIDDM-2, CVA with right-sided weakness, stage IV sacral wound, pancytopenia, MGUS, HTN, HLD and hypothyroidism brought to ED by EMS from [facility] rehab due to pain in sacral area and shortness of breath 4 days after he was discharged from the hospital.  Patient was hospitalized from 3/26-4/10 with generalized weakness, encephalopathy and COVID-19 infection. Patient came back with skin injury suffered during previous hospitalization, and he had significant pain, discomfort and progressive foul-smelling discharge from sacral wound over the course of 4 to 5 days before coming back to ED. he was admitted due to persistent wounds.  General surgery, orthopedic surgery, ID, palliative all consulted.  Patient is in the hospital for prolonged time.    Significant imaging / results / micro data: 4/14-COVID-19 PCR positive.  Flu and RSV PCR nonreactive 4/14-blood cultures NGTD 6/4-sacral wound culture with Pseudomonas aeruginosa and Klebsiella pneumoniae 7/14-pleural fluid culture NGTD   Subjective:  Looks like was disimpacted significantly overnight 9/11 with good effect and a lot of stool coming out Abdomen is less painful no guarding  Assessment & Plan:   Principal Problem:   Severe sepsis (HCC) Active Problems:   Paroxysmal atrial fibrillation (HCC)   Sepsis (HCC)   Atrial flutter (HCC)   Sacral ulcer (HCC)   MGUS (monoclonal gammopathy of unknown significance)   Essential hypertension   Hyperlipidemia   Insulin  dependent type 2 diabetes mellitus (HCC)   History of CVA (cerebrovascular accident)   Cardiac amyloidosis (HCC)   Chronic systolic CHF (congestive heart failure) (HCC)   GERD (gastroesophageal reflux disease)   History of hypertension    Atrial fibrillation with RVR (HCC)   Community acquired pneumonia   Protein-calorie malnutrition, severe   Wild-type transthyretin-related (ATTR) amyloidosis (HCC)   Sacral decubitus ulcer, stage IV (HCC)   Pressure injury of skin of trochanteric region of left hip    Stage IV sacral decubitus wound/ulcer with severe sepsis on admission: Treated as below. - Culture data as above - 4/14-CT showed sacral wound extension to the right gluteus muscle without signs of osteomyelitis. - 4/15-underwent bedside debridement of sacral decubitus wound by general surgery - 4/21-ID consulted and recommended continuing broad-spectrum antibiotics that he completed on 6/13. 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 - 6/17- ID recommended continuing holding off antibiotics since he is hemodynamically stable, afebrile without leukocytosis. - 6/17-evaluated by plastic surgery.  Sacral wound felt to be clean.  Wound care recommended - 7/13-7/13-CT showed similar appearance of osteomyelitis underlying sacral decubitus ulcer.  - 7/13-plastic surgery recommended for continued wound care and no surgical management. - 7/14-Ortho evaluated patient for left hip wound.  Debulked and recommended optimizing nutrition - 8/5- Surgery recommended hydrotherapy for left hip wound. Surgical debridement if not successful - 8/5-wound care and instruction updated  - 8/15-left hip wound worse with tunneling. X-ray with left lateral hip soft tissue defect.  No acute bony abnormality.  -Overall, difficult case due to patient's nutritional status and limited mobility.  Prealbumin <5. - 8/15 with CT showed left hip wound which extends deep, 2.8 cm without evidence of acute osteolysis involving the underlying left greater trochanter.   - 07/12/2024: Ortho re-consult-- conservative management.  Continue wound care as per wound care consult recommendations. Current wound  pix  -Wound vac to hip  changed on 9/11-- Routine, Daily, First occurrence on Tue 07/30/24 at 0500 1- Left shoulder Unstageable Cleanse with NS, pat dry, Apply 1/4 thick layer of Santyl  to wound bed, top with saline moist gauze. Top with silicone foam. Change daily. 2- Left trochanter: NPWT WOC will change 2x weekly 3 - Sacral/ L buttocks: Cleanse with NS. Apply 1/4 thick layer of Santyl  to wound bed, top with saline moist gauze. Top with silicone foam. Change daily. 3- Right heel; Unstageable; Cleanse with NS, pat dry. Apply Aquacel Ag+ (silver hydrofiber) Lawson # [id number] and cover with silicone foam dressing. Change daily. Off load bilateral heels with Prevalon boots at all times. LALM in place for moisture management and pressure redistribution.  Severe constipation -disimpacted 9/11--scheduled sorbitol --PRN Soap suds Vs [name] enema if no stool in 48 hours despite Sorbitol   Aspiration pneumonia  - Patient with worsening congestion, increased cough on 8/18.   - Chest x-ray 8/18 with lower lobes collapsed ? pneumonia, likely due to being in bed for prolonged amount of time.   - Completed 5 days of Rocephin  and Flagyl  on 07/12/2024. - Diet as per SLP's recommendations--Advanced to Reg solids, thin liquids--he will have intermittent cough with eating as a generality per them. - Remains at risk for aspiration speech signed off 9/8   Acute on chronic combined CHF, cardiac amyloidosis, nonischemic cardiomyopathy, hypotension  - On tafamidis  and vutrisiran  for amyloid.  GDMT limited 2/2 prior episodes of hypotension.   - Has received few doses of IV Lasix  recently along with IV albumin .  Continue Lasix  20 p.o. daily - Continue Tafamidis  and vutrisiran . - Ace wrap for BLE edema, also in Prevalon boots - periodic checks of kidney function and electrolytes, stable now.   Elevated LFTs  - Lipitor reintroduced.  LFTs show slight elevaiton alk phos--likely bony remodelling   PAF - Continue amiodarone   200 daily-and Eliquis  5 twice daily.  Currently rate controlled.     History of CVA - Stable, on Eliquis  5 bid, back on Lipitor 10.   Dysphagia/severe protein calorie malnutrition - Continue supplementation, currently on regular diet but does remain with aspiration concerns.  All-time aspiration precautions--- daughter is very diligent-- nursing has been sensitized to keeping him sitting up   Physical debilitation, muscle weakness - PT/OT ongoing.  Challenging with getting debilitated every day.   Hypothyroidism  -Continue levothyroxine  50 mcg   IDDM  - Continue sliding scaleonly .  Hypoglycemic episode 9/5 night.  On sliding scale insulin .  No more on long-acting insulin .  Encourage nutrition.  Goals of care  - Daughter wants him to transition to home once his wounds are better.   - Progressive pictures of wounds show a new area on the right upper buttock  - Daughter does not want SNF or LTAC.  Palliative care  Dr. Marvine saw the patient in consult on 911 and had a good visit and they will revisit discussion making with regards to complex goals of care on 9/16   Resolved/chronic issues Severe sepsis  - Present on admission.  Resolved. Anxiety and adjustment disorder  - Psychiatry consulted on 7/7 and recommended continuing Zoloft  50 daily.  Psychiatry has signed off. Anemia of chronic disease - From chronic illnesses.  Monitor intermittently.  No signs of bleeding. Ferrous sulphate changed to q0d [can constipate patients] Acute on chronic pain - Continue current pain management with lidocaine  patch and as needed oxycodone  2.5 3 times daily as needed Concern for cholecystitis-unfounded - Ruled out, right upper quadrant ultrasound  initially raise concern, however HIDA scan was negative.  This is likely due to anasarca/third spacing.     DVT prophylaxis:  SCDs Start: 03/04/24 2151 Place TED hose Start: 03/04/24 2151 apixaban  (ELIQUIS ) tablet 5 mg  Code Status:   Code Status:  Limited: Do not attempt resuscitation (DNR) -DNR-LIMITED -Do Not Intubate/DNI  Level of care: Telemetry Medical Status is: Inpatient   Objective: Vitals:   08/03/24 0606 08/03/24 0825 08/03/24 1218 08/03/24 1645  BP: 110/68 119/73 116/78 111/74  Pulse: 75 75 79 73  Resp: 19 16 16 18   Temp: 97.6 F (36.4 C) 98.2 F (36.8 C) 98.7 F (37.1 C) 98.1 F (36.7 C)  TempSrc: Oral Oral Oral Oral  SpO2: 98% 96% 99% 99%  Weight:      Height:        Intake/Output Summary (Last 24 hours) at 08/03/2024 1744 Last data filed at 08/03/2024 1200 Gross per 24 hour  Intake 418 ml  Output 400 ml  Net 18 ml   Filed Weights   07/18/24 0205 07/19/24 0500 08/01/24 0500  Weight: 69.4 kg 69.4 kg 70.8 kg   Body mass index is 21.76 kg/m.  Examination:   Awake coherent oriented debilitated cachectic S1-S2 no murmur Chest is clear  Wounds as above Abdomen soft no rebound no guarding ROM is intact  I have personally reviewed the following labs and images: CBC: Recent Labs  Lab 07/28/24 0202 08/01/24 0536 08/02/24 0534 08/03/24 0520  WBC 6.2 5.3 7.6 6.0  NEUTROABS  --  3.0 5.2 3.7  HGB 10.8* 10.5* 10.6* 10.7*  HCT 31.7* 31.3* 31.8* 31.5*  MCV 87.6 87.9 89.3 88.7  PLT 237 234 244 239   BMP &GFR Recent Labs  Lab 07/28/24 0202 08/01/24 0536 08/02/24 0534 08/03/24 0520  NA 138 140 141 139  K 4.3 4.1 4.7 4.8  CL 104 104 103 103  CO2 24 25 23 26   GLUCOSE 154* 110* 137* 121*  BUN 32* 23 25* 26*  CREATININE 0.92 0.79 0.81 0.83  CALCIUM  8.5* 8.4* 8.2* 8.8*   Estimated Creatinine Clearance: 67.5 mL/min (by C-G formula based on SCr of 0.83 mg/dL). Liver & Pancreas: Recent Labs  Lab 07/28/24 0202 08/01/24 0536 08/02/24 0534 08/03/24 0520  AST 34 33 39 34  ALT 23 21 22 22   ALKPHOS 221* 240* 252* 242*  BILITOT 2.1* 1.9* 2.7* 2.3*  PROT 5.6* 5.4* 5.9* 5.9*  ALBUMIN  2.4* 2.3* 2.9* 2.9*    Diabetic: No results for input(s): HGBA1C in the last 72 hours. Recent Labs  Lab  08/02/24 1550 08/02/24 2043 08/03/24 0828 08/03/24 1237 08/03/24 1711  GLUCAP 121* 172* 106* 140* 151*    Urine analysis:    Component Value Date/Time   COLORURINE STRAW (A) 03/04/2024 2139   APPEARANCEUR CLEAR 03/04/2024 2139   LABSPEC 1.016 03/04/2024 2139   PHURINE 6.0 03/04/2024 2139   GLUCOSEU >=500 (A) 03/04/2024 2139   HGBUR SMALL (A) 03/04/2024 2139   BILIRUBINUR NEGATIVE 03/04/2024 2139   KETONESUR NEGATIVE 03/04/2024 2139   PROTEINUR 30 (A) 03/04/2024 2139   UROBILINOGEN 0.2 05/05/2015 1443   NITRITE NEGATIVE 03/04/2024 2139   LEUKOCYTESUR NEGATIVE 03/04/2024 2139     Scheduled Meds:  (feeding supplement) PROSource Plus  30 mL Oral BID BM   amiodarone   200 mg Oral Daily   apixaban   5 mg Oral BID   atorvastatin   10 mg Oral Daily   collagenase    Topical Daily   diclofenac  Sodium  4 g Topical QID  feeding supplement  237 mL Oral BID BM   ferrous sulfate   325 mg Oral QODAY   fluticasone   1 spray Each Nare Daily   furosemide   20 mg Oral Daily   insulin  aspart  0-9 Units Subcutaneous TID WC   levothyroxine   50 mcg Oral Q0600   lidocaine   1 patch Transdermal Q24H   liver oil-zinc  oxide   Topical BID   loratadine   10 mg Oral Daily   multivitamin with minerals  1 tablet Oral Daily   nutrition supplement (JUVEN)  1 packet Oral BID BM   mouth rinse  15 mL Mouth Rinse 4 times per day   pantoprazole   40 mg Oral Daily   sertraline   50 mg Oral Daily   sodium chloride  flush  10-40 mL Intracatheter Q12H   [START ON 08/04/2024] sodium phosphate   1 enema Rectal QODAY   sorbitol   30 mL Oral Daily   Tafamidis   1 capsule Oral Daily   vitamin A   10,000 Units Oral Daily   vutrisiran  sodium  25 mg Subcutaneous Q90 days   Continuous Infusions:   LOS: 152 days   45 minutes  Jai-Gurmukh Krisann Mckenna, MD [facility] [email or web address] 08/03/2024, 5:44 PM

## 2024-08-03 NOTE — Plan of Care (Signed)
  Problem: Fluid Volume: Goal: Hemodynamic stability will improve Outcome: Progressing   Problem: Clinical Measurements: Goal: Diagnostic test results will improve Outcome: Progressing Goal: Signs and symptoms of infection will decrease Outcome: Progressing   Problem: Education: Goal: Knowledge of risk factors and measures for prevention of condition will improve Outcome: Progressing   Problem: Coping: Goal: Psychosocial and spiritual needs will be supported Outcome: Progressing   Problem: Respiratory: Goal: Will maintain a patent airway Outcome: Progressing Goal: Complications related to the disease process, condition or treatment will be avoided or minimized Outcome: Progressing   Problem: Education: Goal: Ability to describe self-care measures that may prevent or decrease complications (Diabetes Survival Skills Education) will improve Outcome: Progressing   Problem: Coping: Goal: Ability to adjust to condition or change in health will improve Outcome: Progressing   Problem: Fluid Volume: Goal: Ability to maintain a balanced intake and output will improve Outcome: Progressing   Problem: Health Behavior/Discharge Planning: Goal: Ability to manage health-related needs will improve Outcome: Progressing   Problem: Metabolic: Goal: Ability to maintain appropriate glucose levels will improve Outcome: Progressing   Problem: Nutritional: Goal: Maintenance of adequate nutrition will improve Outcome: Progressing Goal: Progress toward achieving an optimal weight will improve Outcome: Progressing   Problem: Skin Integrity: Goal: Risk for impaired skin integrity will decrease Outcome: Progressing   Problem: Tissue Perfusion: Goal: Adequacy of tissue perfusion will improve Outcome: Progressing   Problem: Education: Goal: Knowledge of General Education information will improve Description: Including pain rating scale, medication(s)/side effects and non-pharmacologic  comfort measures Outcome: Progressing   Problem: Health Behavior/Discharge Planning: Goal: Ability to manage health-related needs will improve Outcome: Progressing   Problem: Clinical Measurements: Goal: Ability to maintain clinical measurements within normal limits will improve Outcome: Progressing Goal: Will remain free from infection Outcome: Progressing Goal: Diagnostic test results will improve Outcome: Progressing Goal: Respiratory complications will improve Outcome: Progressing Goal: Cardiovascular complication will be avoided Outcome: Progressing   Problem: Activity: Goal: Risk for activity intolerance will decrease Outcome: Progressing   Problem: Nutrition: Goal: Adequate nutrition will be maintained Outcome: Progressing   Problem: Coping: Goal: Level of anxiety will decrease Outcome: Progressing   Problem: Elimination: Goal: Will not experience complications related to bowel motility Outcome: Progressing Goal: Will not experience complications related to urinary retention Outcome: Progressing   Problem: Pain Managment: Goal: General experience of comfort will improve and/or be controlled Outcome: Progressing   Problem: Safety: Goal: Ability to remain free from injury will improve Outcome: Progressing   Problem: Skin Integrity: Goal: Risk for impaired skin integrity will decrease Outcome: Progressing

## 2024-08-04 DIAGNOSIS — R6520 Severe sepsis without septic shock: Secondary | ICD-10-CM | POA: Diagnosis not present

## 2024-08-04 DIAGNOSIS — A419 Sepsis, unspecified organism: Secondary | ICD-10-CM | POA: Diagnosis not present

## 2024-08-04 LAB — GLUCOSE, CAPILLARY
Glucose-Capillary: 82 mg/dL (ref 70–99)
Glucose-Capillary: 111 mg/dL — ABNORMAL HIGH (ref 70–99)
Glucose-Capillary: 172 mg/dL — ABNORMAL HIGH (ref 70–99)
Glucose-Capillary: 195 mg/dL — ABNORMAL HIGH (ref 70–99)

## 2024-08-04 MED ORDER — POLYETHYLENE GLYCOL 3350 17 G PO PACK
17.0000 g | PACK | Freq: Two times a day (BID) | ORAL | Status: DC
  Administered 2024-08-04 – 2024-08-10 (×10): 17 g via ORAL
  Filled 2024-08-04 (×12): qty 1

## 2024-08-04 NOTE — Plan of Care (Signed)
  Problem: Fluid Volume: Goal: Hemodynamic stability will improve Outcome: Progressing   Problem: Clinical Measurements: Goal: Diagnostic test results will improve Outcome: Progressing Goal: Signs and symptoms of infection will decrease Outcome: Progressing   Problem: Education: Goal: Knowledge of risk factors and measures for prevention of condition will improve Outcome: Progressing   Problem: Coping: Goal: Psychosocial and spiritual needs will be supported Outcome: Progressing   Problem: Respiratory: Goal: Will maintain a patent airway Outcome: Progressing Goal: Complications related to the disease process, condition or treatment will be avoided or minimized Outcome: Progressing   Problem: Education: Goal: Ability to describe self-care measures that may prevent or decrease complications (Diabetes Survival Skills Education) will improve Outcome: Progressing   Problem: Coping: Goal: Ability to adjust to condition or change in health will improve Outcome: Progressing   Problem: Fluid Volume: Goal: Ability to maintain a balanced intake and output will improve Outcome: Progressing   Problem: Health Behavior/Discharge Planning: Goal: Ability to manage health-related needs will improve Outcome: Progressing   Problem: Metabolic: Goal: Ability to maintain appropriate glucose levels will improve Outcome: Progressing   Problem: Nutritional: Goal: Maintenance of adequate nutrition will improve Outcome: Progressing Goal: Progress toward achieving an optimal weight will improve Outcome: Progressing   Problem: Skin Integrity: Goal: Risk for impaired skin integrity will decrease Outcome: Progressing   Problem: Tissue Perfusion: Goal: Adequacy of tissue perfusion will improve Outcome: Progressing   Problem: Education: Goal: Knowledge of General Education information will improve Description: Including pain rating scale, medication(s)/side effects and non-pharmacologic  comfort measures Outcome: Progressing   Problem: Health Behavior/Discharge Planning: Goal: Ability to manage health-related needs will improve Outcome: Progressing   Problem: Clinical Measurements: Goal: Ability to maintain clinical measurements within normal limits will improve Outcome: Progressing Goal: Will remain free from infection Outcome: Progressing Goal: Diagnostic test results will improve Outcome: Progressing Goal: Respiratory complications will improve Outcome: Progressing Goal: Cardiovascular complication will be avoided Outcome: Progressing   Problem: Activity: Goal: Risk for activity intolerance will decrease Outcome: Progressing   Problem: Nutrition: Goal: Adequate nutrition will be maintained Outcome: Progressing   Problem: Coping: Goal: Level of anxiety will decrease Outcome: Progressing   Problem: Elimination: Goal: Will not experience complications related to bowel motility Outcome: Progressing Goal: Will not experience complications related to urinary retention Outcome: Progressing   Problem: Pain Managment: Goal: General experience of comfort will improve and/or be controlled Outcome: Progressing   Problem: Safety: Goal: Ability to remain free from injury will improve Outcome: Progressing   Problem: Skin Integrity: Goal: Risk for impaired skin integrity will decrease Outcome: Progressing

## 2024-08-04 NOTE — Progress Notes (Signed)
 PROGRESS NOTE  Earl [name]  FMW:[id number] DOB: Jun 23, 1941 DOA: 03/04/2024 PCP: [name], MD  Consultants  Brief Narrative: Earl [name] is an 83 year old M with PMH of PAF on Eliquis , HFmrEF, amyloid cardiomyopathy, NIDDM-2, CVA with right-sided weakness, stage IV sacral wound, pancytopenia, MGUS, HTN, HLD and hypothyroidism brought to ED by EMS from [facility] rehab due to pain in sacral area and shortness of breath 4 days after he was discharged from the hospital.  Patient was hospitalized from 3/26-4/10 with generalized weakness, encephalopathy and COVID-19 infection. Patient came back with skin injury suffered during previous hospitalization, and he had significant pain, discomfort and progressive foul-smelling discharge from sacral wound over the course of 4 to 5 days before coming back to ED. he was admitted due to persistent wounds.  General surgery, orthopedic surgery, ID, palliative all consulted.  Patient is in the hospital for prolonged time.    Significant imaging / results / micro data: 4/14-COVID-19 PCR positive.  Flu and RSV PCR nonreactive 4/14-blood cultures NGTD 6/4-sacral wound culture with Pseudomonas aeruginosa and Klebsiella pneumoniae 7/14-pleural fluid culture NGTD   Subjective:  Constipated +--not eating much today No effec tof enema today Bowel regimen adjusted to increase to miralax  bid + Sorbitol   Assessment & Plan:   Principal Problem:   Severe sepsis (HCC) Active Problems:   Paroxysmal atrial fibrillation (HCC)   Sepsis (HCC)   Atrial flutter (HCC)   Sacral ulcer (HCC)   MGUS (monoclonal gammopathy of unknown significance)   Essential hypertension   Hyperlipidemia   Insulin  dependent type 2 diabetes mellitus (HCC)   History of CVA (cerebrovascular accident)   Cardiac amyloidosis (HCC)   Chronic systolic CHF (congestive heart failure) (HCC)   GERD (gastroesophageal reflux disease)   History of hypertension   Atrial  fibrillation with RVR (HCC)   Community acquired pneumonia   Protein-calorie malnutrition, severe   Wild-type transthyretin-related (ATTR) amyloidosis (HCC)   Sacral decubitus ulcer, stage IV (HCC)   Pressure injury of skin of trochanteric region of left hip    Stage IV sacral decubitus wound/ulcer with severe sepsis on admission: Treated as below. - Culture data as above - 4/14-CT showed sacral wound extension to the right gluteus muscle without signs of osteomyelitis. - 4/15-underwent bedside debridement of sacral decubitus wound by general surgery - 4/21-ID consulted and recommended continuing broad-spectrum antibiotics that he completed on 6/13. 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 - 6/17- ID recommended continuing holding off antibiotics since he is hemodynamically stable, afebrile without leukocytosis. - 6/17-evaluated by plastic surgery.  Sacral wound felt to be clean.  Wound care recommended - 7/13-7/13-CT showed similar appearance of osteomyelitis underlying sacral decubitus ulcer.  - 7/13-plastic surgery recommended for continued wound care and no surgical management. - 7/14-Ortho evaluated patient for left hip wound.  Debulked and recommended optimizing nutrition - 8/5- Surgery recommended hydrotherapy for left hip wound. Surgical debridement if not successful - 8/5-wound care and instruction updated  - 8/15-left hip wound worse with tunneling. X-ray with left lateral hip soft tissue defect.  No acute bony abnormality.  -Overall, difficult case due to patient's nutritional status and limited mobility.  Prealbumin <5. - 8/15 with CT showed left hip wound which extends deep, 2.8 cm without evidence of acute osteolysis involving the underlying left greater trochanter.   - 07/12/2024: Ortho re-consult-- conservative management.  Continue wound care as per wound care consult recommendations. Wounds last exmained  9/13 with Daughter present---next wound chekcs on ~9/15 -  Wound vac to hip changed on 9/11-- Routine, Daily, First occurrence on Tue 07/30/24 at 0500 1- Left shoulder Unstageable Cleanse with NS, pat dry, Apply 1/4 thick layer of Santyl  to wound bed, top with saline moist gauze. Top with silicone foam. Change daily. 2- Left trochanter: NPWT WOC will change 2x weekly 3 - Sacral/ L buttocks: Cleanse with NS. Apply 1/4 thick layer of Santyl  to wound bed, top with saline moist gauze. Top with silicone foam. Change daily. 3- Right heel; Unstageable; Cleanse with NS, pat dry. Apply Aquacel Ag+ (silver hydrofiber) Lawson # [id number] and cover with silicone foam dressing. Change daily. Off load bilateral heels with Prevalon boots at all times. LALM in place for moisture management and pressure redistribution.  Severe constipation -disimpacted 9/11--scheduled sorbitol --PRN Soap suds Vs [name] enema if no stool in 48 hours despite Sorbitol  Might need disimpaction 9/15 if no stool  Aspiration pneumonia  - Patient with worsening congestion, increased cough on 8/18.   - Chest x-ray 8/18 with lower lobes collapsed ? pneumonia, likely due to being in bed for prolonged amount of time.   - Completed 5 days of Rocephin  and Flagyl  on 07/12/2024. - Diet as per SLP's recommendations--Advanced to Reg solids, thin liquids--he will have intermittent cough with eating as a generality per them. - Remains at risk for aspiration speech signed off 9/8   Acute on chronic combined CHF, cardiac amyloidosis, nonischemic cardiomyopathy, hypotension  - On tafamidis  and vutrisiran  for amyloid.  GDMT limited 2/2 prior episodes of hypotension.   - Has received few doses of IV Lasix  recently along with IV albumin .  Continue Lasix  20 p.o. daily - Continue Tafamidis  and vutrisiran . - Ace wrap for BLE edema, also in Prevalon boots - periodic checks of kidney function and electrolytes, stable now.   Elevated LFTs  - Lipitor reintroduced.   LFTs show slight elevaiton alk phos--likely bony remodelling   PAF - Continue amiodarone  200 daily-and Eliquis  5 twice daily.  Currently rate controlled.     History of CVA - Stable, on Eliquis  5 bid, back on Lipitor 10.   Dysphagia/severe protein calorie malnutrition - Continue supplementation, currently on regular diet but does remain with aspiration concerns.  All-time aspiration precautions--- daughter is very diligent-- nursing has been sensitized to keeping him sitting up We changed the Juven to different times on 9/13 so he doesn't get full on snacks and eats food   Physical debilitation, muscle weakness - PT/OT ongoing.  Challenging with getting debilitated every day.   Hypothyroidism  -Continue levothyroxine  50 mcg   IDDM  - Continue sliding scale only .  Hypoglycemic episode 9/5 night.  On sliding scale insulin .  No more on long-acting insulin .  Encourage nutrition.  Goals of care  - Daughter wants him to transition to home once his wounds are better.   - Progressive pictures of wounds show a new area on the right upper buttock  - Daughter does not want SNF or LTAC.  Palliative care  Dr. Marvine saw the patient in consult on 911 and had a good visit and they will revisit discussion making with regards to complex goals of care on 9/16   Resolved/chronic issues Severe sepsis  - Present on admission.  Resolved. Anxiety and adjustment disorder  - Psychiatry consulted on 7/7 and recommended continuing Zoloft  50 daily.  Psychiatry has signed off. Anemia of chronic disease - From chronic illnesses.  Monitor intermittently.  No signs of bleeding. Ferrous sulphate changed to q0d [can constipate patients] Acute  on chronic pain - Continue current pain management with lidocaine  patch and as needed oxycodone  2.5 3 times daily as needed Concern for cholecystitis-unfounded - Ruled out, right upper quadrant ultrasound initially raise concern, however HIDA scan was negative.  This is  likely due to anasarca/third spacing.     DVT prophylaxis:  SCDs Start: 03/04/24 2151 Place TED hose Start: 03/04/24 2151 apixaban  (ELIQUIS ) tablet 5 mg  Code Status:   Code Status: Limited: Do not attempt resuscitation (DNR) -DNR-LIMITED -Do Not Intubate/DNI  Level of care: Telemetry Medical Status is: Inpatient   Objective: Vitals:   08/04/24 0452 08/04/24 0816 08/04/24 1156 08/04/24 1625  BP: 106/63 102/65 125/77 116/81  Pulse: 65 65 78 76  Resp: 18 16 17 19   Temp: 98.3 F (36.8 C) (!) 97.5 F (36.4 C) 98.2 F (36.8 C) (!) 97.2 F (36.2 C)  TempSrc:      SpO2: 97% 100% 98% 98%  Weight:      Height:        Intake/Output Summary (Last 24 hours) at 08/04/2024 1720 Last data filed at 08/04/2024 0651 Gross per 24 hour  Intake --  Output 900 ml  Net -900 ml   Filed Weights   07/19/24 0500 08/01/24 0500 08/04/24 0450  Weight: 69.4 kg 70.8 kg 73.9 kg   Body mass index is 22.73 kg/m.  Examination:   Awake coherent oriented debilitated cachectic S1-S2 no murmur Chest is clear  Abdomen slight distension ROM is intact  I have personally reviewed the following labs and images: CBC: Recent Labs  Lab 08/01/24 0536 08/02/24 0534 08/03/24 0520  WBC 5.3 7.6 6.0  NEUTROABS 3.0 5.2 3.7  HGB 10.5* 10.6* 10.7*  HCT 31.3* 31.8* 31.5*  MCV 87.9 89.3 88.7  PLT 234 244 239   BMP &GFR Recent Labs  Lab 08/01/24 0536 08/02/24 0534 08/03/24 0520  NA 140 141 139  K 4.1 4.7 4.8  CL 104 103 103  CO2 25 23 26   GLUCOSE 110* 137* 121*  BUN 23 25* 26*  CREATININE 0.79 0.81 0.83  CALCIUM  8.4* 8.2* 8.8*   Estimated Creatinine Clearance: 70.5 mL/min (by C-G formula based on SCr of 0.83 mg/dL). Liver & Pancreas: Recent Labs  Lab 08/01/24 0536 08/02/24 0534 08/03/24 0520  AST 33 39 34  ALT 21 22 22   ALKPHOS 240* 252* 242*  BILITOT 1.9* 2.7* 2.3*  PROT 5.4* 5.9* 5.9*  ALBUMIN  2.3* 2.9* 2.9*    Diabetic: No results for input(s): HGBA1C in the last 72  hours. Recent Labs  Lab 08/03/24 1711 08/03/24 2050 08/04/24 0822 08/04/24 1152 08/04/24 1628  GLUCAP 151* 149* 82 111* 172*    Urine analysis:    Component Value Date/Time   COLORURINE STRAW (A) 03/04/2024 2139   APPEARANCEUR CLEAR 03/04/2024 2139   LABSPEC 1.016 03/04/2024 2139   PHURINE 6.0 03/04/2024 2139   GLUCOSEU >=500 (A) 03/04/2024 2139   HGBUR SMALL (A) 03/04/2024 2139   BILIRUBINUR NEGATIVE 03/04/2024 2139   KETONESUR NEGATIVE 03/04/2024 2139   PROTEINUR 30 (A) 03/04/2024 2139   UROBILINOGEN 0.2 05/05/2015 1443   NITRITE NEGATIVE 03/04/2024 2139   LEUKOCYTESUR NEGATIVE 03/04/2024 2139     Scheduled Meds:  (feeding supplement) PROSource Plus  30 mL Oral BID BM   amiodarone   200 mg Oral Daily   apixaban   5 mg Oral BID   atorvastatin   10 mg Oral Daily   collagenase    Topical Daily   diclofenac  Sodium  4 g Topical QID  feeding supplement  237 mL Oral BID BM   ferrous sulfate   325 mg Oral QODAY   fluticasone   1 spray Each Nare Daily   furosemide   20 mg Oral Daily   insulin  aspart  0-9 Units Subcutaneous TID WC   levothyroxine   50 mcg Oral Q0600   lidocaine   1 patch Transdermal Q24H   liver oil-zinc  oxide   Topical BID   loratadine   10 mg Oral Daily   multivitamin with minerals  1 tablet Oral Daily   nutrition supplement (JUVEN)  1 packet Oral Daily   nutrition supplement (JUVEN)  1 packet Oral q1600   mouth rinse  15 mL Mouth Rinse 4 times per day   pantoprazole   40 mg Oral Daily   polyethylene glycol  17 g Oral BID   sertraline   50 mg Oral Daily   sodium chloride  flush  10-40 mL Intracatheter Q12H   sodium phosphate   1 enema Rectal QODAY   sorbitol   30 mL Oral Daily   Tafamidis   1 capsule Oral Daily   vitamin A   10,000 Units Oral Daily   vutrisiran  sodium  25 mg Subcutaneous Q90 days   Continuous Infusions:   LOS: 153 days   30 minutes  Jai-Gurmukh Babacar Haycraft, MD [facility] [email or web address] 08/04/2024, 5:20 PM

## 2024-08-05 DIAGNOSIS — R6520 Severe sepsis without septic shock: Secondary | ICD-10-CM | POA: Diagnosis not present

## 2024-08-05 DIAGNOSIS — A419 Sepsis, unspecified organism: Secondary | ICD-10-CM | POA: Diagnosis not present

## 2024-08-05 LAB — BASIC METABOLIC PANEL WITH GFR
Anion gap: 10 (ref 5–15)
BUN: 31 mg/dL — ABNORMAL HIGH (ref 8–23)
CO2: 26 mmol/L (ref 22–32)
Calcium: 8.6 mg/dL — ABNORMAL LOW (ref 8.9–10.3)
Chloride: 104 mmol/L (ref 98–111)
Creatinine, Ser: 0.87 mg/dL (ref 0.61–1.24)
GFR, Estimated: >60 mL/min (ref >60)
Glucose, Bld: 125 mg/dL — ABNORMAL HIGH (ref 70–99)
Potassium: 4.2 mmol/L (ref 3.5–5.1)
Sodium: 140 mmol/L (ref 135–145)

## 2024-08-05 LAB — GLUCOSE, CAPILLARY
Glucose-Capillary: 101 mg/dL — ABNORMAL HIGH (ref 70–99)
Glucose-Capillary: 153 mg/dL — ABNORMAL HIGH (ref 70–99)
Glucose-Capillary: 167 mg/dL — ABNORMAL HIGH (ref 70–99)
Glucose-Capillary: 107 mg/dL — ABNORMAL HIGH (ref 70–99)

## 2024-08-05 MED ORDER — SILVER NITRATE-POT NITRATE 75-25 % EX MISC
2.0000 | Freq: Once | CUTANEOUS | Status: DC
Start: 2024-08-08 — End: 2024-08-23
  Filled 2024-08-05: qty 2

## 2024-08-05 NOTE — Plan of Care (Signed)
  Problem: Fluid Volume: Goal: Hemodynamic stability will improve Outcome: Progressing   Problem: Clinical Measurements: Goal: Diagnostic test results will improve Outcome: Progressing Goal: Signs and symptoms of infection will decrease Outcome: Progressing   Problem: Education: Goal: Knowledge of risk factors and measures for prevention of condition will improve Outcome: Progressing   Problem: Coping: Goal: Psychosocial and spiritual needs will be supported Outcome: Progressing   Problem: Respiratory: Goal: Will maintain a patent airway Outcome: Progressing Goal: Complications related to the disease process, condition or treatment will be avoided or minimized Outcome: Progressing   Problem: Education: Goal: Ability to describe self-care measures that may prevent or decrease complications (Diabetes Survival Skills Education) will improve Outcome: Progressing   Problem: Coping: Goal: Ability to adjust to condition or change in health will improve Outcome: Progressing   Problem: Fluid Volume: Goal: Ability to maintain a balanced intake and output will improve Outcome: Progressing   Problem: Health Behavior/Discharge Planning: Goal: Ability to manage health-related needs will improve Outcome: Progressing   Problem: Metabolic: Goal: Ability to maintain appropriate glucose levels will improve Outcome: Progressing   Problem: Nutritional: Goal: Maintenance of adequate nutrition will improve Outcome: Progressing Goal: Progress toward achieving an optimal weight will improve Outcome: Progressing   Problem: Skin Integrity: Goal: Risk for impaired skin integrity will decrease Outcome: Progressing   Problem: Tissue Perfusion: Goal: Adequacy of tissue perfusion will improve Outcome: Progressing   Problem: Education: Goal: Knowledge of General Education information will improve Description: Including pain rating scale, medication(s)/side effects and non-pharmacologic  comfort measures Outcome: Progressing   Problem: Health Behavior/Discharge Planning: Goal: Ability to manage health-related needs will improve Outcome: Progressing   Problem: Clinical Measurements: Goal: Ability to maintain clinical measurements within normal limits will improve Outcome: Progressing Goal: Will remain free from infection Outcome: Progressing Goal: Diagnostic test results will improve Outcome: Progressing Goal: Respiratory complications will improve Outcome: Progressing Goal: Cardiovascular complication will be avoided Outcome: Progressing   Problem: Activity: Goal: Risk for activity intolerance will decrease Outcome: Progressing   Problem: Nutrition: Goal: Adequate nutrition will be maintained Outcome: Progressing   Problem: Coping: Goal: Level of anxiety will decrease Outcome: Progressing   Problem: Elimination: Goal: Will not experience complications related to bowel motility Outcome: Progressing Goal: Will not experience complications related to urinary retention Outcome: Progressing   Problem: Pain Managment: Goal: General experience of comfort will improve and/or be controlled Outcome: Progressing   Problem: Safety: Goal: Ability to remain free from injury will improve Outcome: Progressing   Problem: Skin Integrity: Goal: Risk for impaired skin integrity will decrease Outcome: Progressing

## 2024-08-05 NOTE — Progress Notes (Signed)
 PROGRESS NOTE  Earl [name]  FMW:[id number] DOB: 1941/05/02 DOA: 03/04/2024 PCP: [name], MD  Consultants  Brief Narrative: Earl [name] is an 83 year old M with PMH of PAF on Eliquis , HFmrEF, amyloid cardiomyopathy, NIDDM-2, CVA with right-sided weakness, stage IV sacral wound, pancytopenia, MGUS, HTN, HLD and hypothyroidism brought to ED by EMS from [facility] rehab due to pain in sacral area and shortness of breath 4 days after he was discharged from the hospital.  Patient was hospitalized from 3/26-4/10 with generalized weakness, encephalopathy and COVID-19 infection. Patient came back with skin injury suffered during previous hospitalization, and he had significant pain, discomfort and progressive foul-smelling discharge from sacral wound over the course of 4 to 5 days before coming back to ED. he was admitted due to persistent wounds.  General surgery, orthopedic surgery, ID, palliative all consulted.  Patient is in the hospital for prolonged time.    Significant imaging / results / micro data: 4/14-COVID-19 PCR positive.  Flu and RSV PCR nonreactive 4/14-blood cultures NGTD 6/4-sacral wound culture with Pseudomonas aeruginosa and Klebsiella pneumoniae 7/14-pleural fluid culture NGTD   Subjective:  Small stool today-note that overnight was transitioned to dysphagia 3 diet at daughter request and is coughing continuously Re-requested Speech therapy --they are  aware of patient-see the prior charting  Assessment & Plan:   Principal Problem:   Severe sepsis (HCC) Active Problems:   Paroxysmal atrial fibrillation (HCC)   Sepsis (HCC)   Atrial flutter (HCC)   Sacral ulcer (HCC)   MGUS (monoclonal gammopathy of unknown significance)   Essential hypertension   Hyperlipidemia   Insulin  dependent type 2 diabetes mellitus (HCC)   History of CVA (cerebrovascular accident)   Cardiac amyloidosis (HCC)   Chronic systolic CHF (congestive heart failure) (HCC)   GERD  (gastroesophageal reflux disease)   History of hypertension   Atrial fibrillation with RVR (HCC)   Community acquired pneumonia   Protein-calorie malnutrition, severe   Wild-type transthyretin-related (ATTR) amyloidosis (HCC)   Sacral decubitus ulcer, stage IV (HCC)   Pressure injury of skin of trochanteric region of left hip    Stage IV sacral decubitus wound/ulcer with severe sepsis on admission: Treated as below. - Culture data as above - 4/14-CT showed sacral wound extension to the right gluteus muscle without signs of osteomyelitis. - 4/15-underwent bedside debridement of sacral decubitus wound by general surgery - 4/21-ID consulted and recommended continuing broad-spectrum antibiotics that he completed on 6/13. 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 - 6/17- ID recommended continuing holding off antibiotics since he is hemodynamically stable, afebrile without leukocytosis. - 6/17-evaluated by plastic surgery.  Sacral wound felt to be clean.  Wound care recommended - 7/13-7/13-CT showed similar appearance of osteomyelitis underlying sacral decubitus ulcer.  - 7/13-plastic surgery recommended for continued wound care and no surgical management. - 7/14-Ortho evaluated patient for left hip wound.  Debulked and recommended optimizing nutrition - 8/5- Surgery recommended hydrotherapy for left hip wound. Surgical debridement if not successful - 8/5-wound care and instruction updated  - 8/15-left hip wound worse with tunneling. X-ray with left lateral hip soft tissue defect.  No acute bony abnormality.  -Overall, difficult case due to patient's nutritional status and limited mobility.  Prealbumin <5. - 8/15 with CT showed left hip wound which extends deep, 2.8 cm without evidence of acute osteolysis involving the underlying left greater trochanter.   - 07/12/2024: Ortho re-consult-- conservative management.  Continue wound  care as per wound care consult recommendations. Wounds  last exmamined by me 9/13 with Daughter present-- Per WOC RN 9/14 1- Left shoulder Unstageable Cleanse with NS, pat dry, Apply 1/4 thick layer of Santyl  to wound bed, top with saline moist gauze. Top with silicone foam. Change daily. 2- Left trochanter: NPWT WOC will change 2x weekly 3 - Sacral/ L buttocks: Cleanse with Vashe ([id number]) Place Aquacel AG (lawson # [id number]) over wound bed, tuck into the undermining area using a cotton tipped applicator. Top with silicone foam. Change daily. 3- Right heel; Unstageable; Cleanse with NS, pat dry. Apply Aquacel Ag+ (silver  hydrofiber) Lawson # [id number], ensure tucked into the edges where eschar is lifting. Cover with silicone foam dressing. Change daily. Off load bilateral heels with Prevalon boots at all times. LALM in place for moisture management and pressure redistribution.   Severe constipation -disimpacted 9/11--scheduled sorbitol --PRN Soap suds Vs [name] enema if no stool in 48 hours despite Sorbitol  Might need disimpaction 9/15 if no stool  Aspiration pneumonia  - Patient with worsening congestion, increased cough on 8/18.   - Chest x-ray 8/18 with lower lobes collapsed ? pneumonia, likely due to being in bed for prolonged amount of time.   - Completed 5 days of Rocephin  and Flagyl  on 07/12/2024. - Diet as per SLP's recommendations--currently dysphagia 3-SLP to revisit and discussed with family - Nutritionist to see patient and talk with family about options for increasing protein in diet Overall remains at risk for aspiration in general and prognosis may be guarded because of this   Acute on chronic combined CHF, cardiac amyloidosis, nonischemic cardiomyopathy, hypotension  - On tafamidis  and vutrisiran  for amyloid.  GDMT limited 2/2 prior episodes of hypotension.   - Has received few doses of IV Lasix  recently along with IV albumin .  Continue Lasix  20 p.o. daily - Continue Tafamidis  and  vutrisiran . - Ace wrap for BLE edema, also in Prevalon boots - periodic checks of kidney function and electrolytes, stable now.   Elevated LFTs  - Lipitor reintroduced.  LFTs show slight elevaiton alk phos--likely bony remodelling   PAF - Continue amiodarone  200 daily-and Eliquis  5 twice daily.  Currently rate controlled.     History of CVA - Stable, on Eliquis  5 bid, back on Lipitor 10.   Dysphagia/severe protein calorie malnutrition - Continue supplementation as able diet changed nursing has been sensitized to keeping him sitting up We changed the Juven to different times on 9/13 so he doesn't get full on snacks and eats food   Physical debilitation, muscle weakness - PT/OT ongoing.  Challenging with getting debilitated every day.   Hypothyroidism  -Continue levothyroxine  50 mcg   IDDM  - Continue sliding scale only .  Hypoglycemic episode 9/5 night.  On sliding scale insulin .  No more on long-acting insulin .  Encourage nutrition.  Goals of care  - Daughter wants him to transition to home once his wounds are better.   - Progressive pictures of wounds show a new area on the right upper buttock  - Daughter does not want SNF or LTAC.  Palliative care  Dr. Marvine to follow-up on 9/16    Resolved/chronic issues Severe sepsis  - Present on admission.  Resolved. Anxiety and adjustment disorder  - Psychiatry consulted on 7/7 and recommended continuing Zoloft  50 daily.  Psychiatry has signed off. Anemia of chronic disease - From chronic illnesses.  Monitor intermittently.  No signs of bleeding. Ferrous sulphate changed to q0d [can constipate patients] Acute on chronic pain - Continue current pain management with lidocaine  patch  and as needed oxycodone  2.5 3 times daily as needed Concern for cholecystitis-unfounded - Ruled out, right upper quadrant ultrasound initially raise concern, however HIDA scan was negative.  This is likely due to anasarca/third spacing.     DVT  prophylaxis:  SCDs Start: 03/04/24 2151 Place TED hose Start: 03/04/24 2151 apixaban  (ELIQUIS ) tablet 5 mg  Code Status:   Code Status: Limited: Do not attempt resuscitation (DNR) -DNR-LIMITED -Do Not Intubate/DNI  Level of care: Telemetry Medical Status is: Inpatient   Objective: Vitals:   08/05/24 0500 08/05/24 0525 08/05/24 0900 08/05/24 1528  BP:  108/63 116/62 (!) 100/57  Pulse:  69 77 68  Resp:  17 16 18   Temp:  (!) 97.4 F (36.3 C)    TempSrc:  Oral    SpO2:  98% 100%   Weight: 74.4 kg     Height:        Intake/Output Summary (Last 24 hours) at 08/05/2024 1657 Last data filed at 08/05/2024 0525 Gross per 24 hour  Intake 400 ml  Output 1300 ml  Net -900 ml   Filed Weights   08/01/24 0500 08/04/24 0450 08/05/24 0500  Weight: 70.8 kg 73.9 kg 74.4 kg   Body mass index is 22.87 kg/m.  Examination:   Weak coughing a little bit Chest is clear Abdomen nontender upper leg edema ROM intact Overall looks weak  I have personally reviewed the following labs and images: CBC: Recent Labs  Lab 08/01/24 0536 08/02/24 0534 08/03/24 0520  WBC 5.3 7.6 6.0  NEUTROABS 3.0 5.2 3.7  HGB 10.5* 10.6* 10.7*  HCT 31.3* 31.8* 31.5*  MCV 87.9 89.3 88.7  PLT 234 244 239   BMP &GFR Recent Labs  Lab 08/01/24 0536 08/02/24 0534 08/03/24 0520 08/05/24 0507  NA 140 141 139 140  K 4.1 4.7 4.8 4.2  CL 104 103 103 104  CO2 25 23 26 26   GLUCOSE 110* 137* 121* 125*  BUN 23 25* 26* 31*  CREATININE 0.79 0.81 0.83 0.87  CALCIUM  8.4* 8.2* 8.8* 8.6*   Estimated Creatinine Clearance: 67.7 mL/min (by C-G formula based on SCr of 0.87 mg/dL). Liver & Pancreas: Recent Labs  Lab 08/01/24 0536 08/02/24 0534 08/03/24 0520  AST 33 39 34  ALT 21 22 22   ALKPHOS 240* 252* 242*  BILITOT 1.9* 2.7* 2.3*  PROT 5.4* 5.9* 5.9*  ALBUMIN  2.3* 2.9* 2.9*    Diabetic: No results for input(s): HGBA1C in the last 72 hours. Recent Labs  Lab 08/04/24 1628 08/04/24 2111 08/05/24 0817  08/05/24 1201 08/05/24 1551  GLUCAP 172* 195* 101* 153* 167*    Urine analysis:    Component Value Date/Time   COLORURINE STRAW (A) 03/04/2024 2139   APPEARANCEUR CLEAR 03/04/2024 2139   LABSPEC 1.016 03/04/2024 2139   PHURINE 6.0 03/04/2024 2139   GLUCOSEU >=500 (A) 03/04/2024 2139   HGBUR SMALL (A) 03/04/2024 2139   BILIRUBINUR NEGATIVE 03/04/2024 2139   KETONESUR NEGATIVE 03/04/2024 2139   PROTEINUR 30 (A) 03/04/2024 2139   UROBILINOGEN 0.2 05/05/2015 1443   NITRITE NEGATIVE 03/04/2024 2139   LEUKOCYTESUR NEGATIVE 03/04/2024 2139     Scheduled Meds:  (feeding supplement) PROSource Plus  30 mL Oral BID BM   amiodarone   200 mg Oral Daily   apixaban   5 mg Oral BID   atorvastatin   10 mg Oral Daily   collagenase    Topical Daily   diclofenac  Sodium  4 g Topical QID   feeding supplement  237 mL Oral BID BM  ferrous sulfate   325 mg Oral QODAY   fluticasone   1 spray Each Nare Daily   furosemide   20 mg Oral Daily   insulin  aspart  0-9 Units Subcutaneous TID WC   levothyroxine   50 mcg Oral Q0600   lidocaine   1 patch Transdermal Q24H   liver oil-zinc  oxide   Topical BID   loratadine   10 mg Oral Daily   multivitamin with minerals  1 tablet Oral Daily   nutrition supplement (JUVEN)  1 packet Oral Daily   nutrition supplement (JUVEN)  1 packet Oral q1600   mouth rinse  15 mL Mouth Rinse 4 times per day   pantoprazole   40 mg Oral Daily   polyethylene glycol  17 g Oral BID   sertraline   50 mg Oral Daily   [START ON 08/08/2024] silver  nitrate applicators  2 Stick Topical Once   sodium chloride  flush  10-40 mL Intracatheter Q12H   sodium phosphate   1 enema Rectal QODAY   sorbitol   30 mL Oral Daily   Tafamidis   1 capsule Oral Daily   vitamin A   10,000 Units Oral Daily   vutrisiran  sodium  25 mg Subcutaneous Q90 days   Continuous Infusions:   LOS: 154 days   18 minutes  Jai-Gurmukh Siaosi Alter, MD [facility] [email or web address] 08/05/2024, 4:57 PM

## 2024-08-05 NOTE — Plan of Care (Addendum)
 Patient's daughter requesting changing regular diet to pured diet. She is very proactive regarding her father's care.   Roselene Gray, MD [facility] 08/05/2024, 5:07 AM

## 2024-08-05 NOTE — Progress Notes (Signed)
 Patient reporting discomfort in his rectum during assessment. Patient reports he has been constipated. This RN performed digital stimulation and disimpacted 10 ping pong size balls of stool. Soap suds enema also completed per PRN order. Patient reports improvement in rectal discomfort after disimpaction.

## 2024-08-05 NOTE — Consult Note (Addendum)
 WOC Nurse wound follow up Wound type: Unstageable pressure injury left shoulder Stage 4 pressure injury: left trochanter Unstageable Pressure Injury; right heel Stage 4 Pressure Injury; sacrum Stage 3 pressure injury to L buttocks Measurement: Left shoulder:  1 cm x 1 cm  Left trochanter: 3 cm x 2 cm x 2 cm with 4 cm undermining from 1 o'clock to 4 o'clock and  6 cm  tunnel at 2 o'clock Right heel: 3 cm x 3 cm Sacrum: 6 cm x 3 cm x 0.5 cm with undermining  from 12 o'clock to 2 o'clock measuring 1 cm L buttocks: 0.5 cm x 0.5 cm Wound bed: Left shoulder: 50% pink, red, moist 50% yellow slough Left trochanter: 90% red, moist, 10 % yellow Right heel: black eschar, lifting at edges/ evolving Sacrum: 100% red, moist L buttocks: 100% red, moist Drainage (amount, consistency, odor)  L shoulder:  serosanguinous L trochanter: serosanguinous in cannister Right heel: purulent Sacrum: serosanguinous to purulent L buttocks: serosanguinous Periwound: intact Dressing procedure/placement/frequency: 1- Left shoulder Unstageable  Cleanse with NS, pat dry, Apply 1/4 thick layer of Santyl  to wound bed, top with saline moist gauze. Top with silicone foam.  Change daily. 2- Left trochanter: NPWT WOC will change 2x weekly 3 - Sacral/ L buttocks: Cleanse with Vashe ([id number]) Place Aquacel AG (lawson # [id number]) over wound bed, tuck into the undermining area using a cotton tipped applicator. Top with silicone foam. Change daily.  3- Right heel; Unstageable; Cleanse with NS, pat dry.  Apply  Aquacel Ag+ (silver  hydrofiber) Lawson # [id number], ensure tucked into the edges where eschar is lifting. Cover with silicone foam dressing. Change daily.  Off load bilateral heels with Prevalon boots at all times.   LALM in place for moisture management and pressure redistribution.  Left trochanter: Removed old NPWT dressing to trochanter  Cleansed wound with normal saline   Filled wound with   __1__ piece of black foam   Sealed NPWT dressing at HG   Patient tolerated procedure well   WOC nurse will continue to provide NPWT dressing changed due to the complexity of the dressing change.    Plan of care discussed with patient's daughter, Para Rahilly, via telephone.   WOC Nurse team will follow with you and see patient within 10 days for wound assessments.  Please notify WOC nurses of any acute changes in the wounds or any new areas of concern   Doyal Polite, RN, MSN, [facility] WOC Team [phone] (Available Mon-Fri 0700-1500)

## 2024-08-06 ENCOUNTER — Ambulatory Visit: Admitting: Podiatry

## 2024-08-06 DIAGNOSIS — A419 Sepsis, unspecified organism: Secondary | ICD-10-CM | POA: Diagnosis not present

## 2024-08-06 DIAGNOSIS — R6520 Severe sepsis without septic shock: Secondary | ICD-10-CM | POA: Diagnosis not present

## 2024-08-06 LAB — GLUCOSE, CAPILLARY
Glucose-Capillary: 81 mg/dL (ref 70–99)
Glucose-Capillary: 94 mg/dL (ref 70–99)
Glucose-Capillary: 92 mg/dL (ref 70–99)
Glucose-Capillary: 149 mg/dL — ABNORMAL HIGH (ref 70–99)
Glucose-Capillary: 133 mg/dL — ABNORMAL HIGH (ref 70–99)
Glucose-Capillary: 179 mg/dL — ABNORMAL HIGH (ref 70–99)

## 2024-08-06 NOTE — Progress Notes (Signed)
 SPEECH PATHOLOGY NOTE  Orders received for clinical swallowing evaluation. Spoke with Earl [name]'s daughter, Earl [name], on the phone this afternoon.  Repeat swallow assessment isn't warranted - swallow is stable. He has a mild oropharyngeal dysphagia that leads to inconsistent, trace, usually sensed aspiration.  With slow, careful hand-feeding, small/controlled sips of thins, and positioning in an upright position, he can manage a regular diet/thin liquids.   Para's concerns are related to solid food options. When Para is present for the dinner meal, she can take the time to chop foods into bite-sized pieces that her dad can manage. When she is not present, a pureed diet may be easier for staff to feed him.  There is currently no hybrid meal option that is set up to allow purees for bfast/lunch and a regular diet for dinner.   Current options are to remain on puree and have family bring in foods to supplement or to try a dysphagia 2/chopped diet that he could receive 3x/day.    Will follow up with RD to determine if any other options exist.  No further SLP f/u warranted.  Levi Klaiber L. Vona, MA CCC/SLP Clinical Specialist - Acute Care SLP Acute Rehabilitation Services Office number [phone]

## 2024-08-06 NOTE — Plan of Care (Signed)
  Problem: Fluid Volume: Goal: Hemodynamic stability will improve Outcome: Progressing   Problem: Clinical Measurements: Goal: Diagnostic test results will improve Outcome: Progressing Goal: Signs and symptoms of infection will decrease Outcome: Progressing   Problem: Education: Goal: Knowledge of risk factors and measures for prevention of condition will improve Outcome: Progressing   Problem: Coping: Goal: Psychosocial and spiritual needs will be supported Outcome: Progressing   Problem: Respiratory: Goal: Will maintain a patent airway Outcome: Progressing Goal: Complications related to the disease process, condition or treatment will be avoided or minimized Outcome: Progressing   Problem: Education: Goal: Ability to describe self-care measures that may prevent or decrease complications (Diabetes Survival Skills Education) will improve Outcome: Progressing   Problem: Coping: Goal: Ability to adjust to condition or change in health will improve Outcome: Progressing   Problem: Fluid Volume: Goal: Ability to maintain a balanced intake and output will improve Outcome: Progressing   Problem: Health Behavior/Discharge Planning: Goal: Ability to manage health-related needs will improve Outcome: Progressing   Problem: Metabolic: Goal: Ability to maintain appropriate glucose levels will improve Outcome: Progressing   Problem: Nutritional: Goal: Maintenance of adequate nutrition will improve Outcome: Progressing Goal: Progress toward achieving an optimal weight will improve Outcome: Progressing   Problem: Skin Integrity: Goal: Risk for impaired skin integrity will decrease Outcome: Progressing   Problem: Tissue Perfusion: Goal: Adequacy of tissue perfusion will improve Outcome: Progressing   Problem: Education: Goal: Knowledge of General Education information will improve Description: Including pain rating scale, medication(s)/side effects and non-pharmacologic  comfort measures Outcome: Progressing   Problem: Health Behavior/Discharge Planning: Goal: Ability to manage health-related needs will improve Outcome: Progressing   Problem: Clinical Measurements: Goal: Ability to maintain clinical measurements within normal limits will improve Outcome: Progressing Goal: Will remain free from infection Outcome: Progressing Goal: Diagnostic test results will improve Outcome: Progressing Goal: Respiratory complications will improve Outcome: Progressing Goal: Cardiovascular complication will be avoided Outcome: Progressing   Problem: Activity: Goal: Risk for activity intolerance will decrease Outcome: Progressing   Problem: Nutrition: Goal: Adequate nutrition will be maintained Outcome: Progressing   Problem: Coping: Goal: Level of anxiety will decrease Outcome: Progressing   Problem: Elimination: Goal: Will not experience complications related to bowel motility Outcome: Progressing Goal: Will not experience complications related to urinary retention Outcome: Progressing   Problem: Pain Managment: Goal: General experience of comfort will improve and/or be controlled Outcome: Progressing   Problem: Safety: Goal: Ability to remain free from injury will improve Outcome: Progressing   Problem: Skin Integrity: Goal: Risk for impaired skin integrity will decrease Outcome: Progressing

## 2024-08-06 NOTE — Progress Notes (Signed)
 Nutrition Follow-up  DOCUMENTATION CODES:   Severe malnutrition in context of chronic illness  INTERVENTION:  -Continue puree diet with thin liquids per pt's daughter's request -Continue 1 packet Juven BID, each packet provides 95 calories, 2.5 grams of protein (collagen), and 9.8 grams of carbohydrate (3 grams sugar); also contains 7 grams of L-arginine and L-glutamine, 300 mg vitamin C , 15 mg vitamin E, 1.2 mcg vitamin B-12, 9.5 mg zinc , 200 mg calcium , and 1.5 g  Calcium  Beta-hydroxy-Beta-methylbutyrate to support wound healing  -Continue mixing 30 ml ProSource Plus BID with Juven, each supplement provides 100 kcals and 15 grams protein.  -Continue Ensure Plus High Protein po BID, each supplement provides 350 kcal and 20 grams of protein  -Continue weekly weights -Continue 10,000 units vitamin A  daily due to chronic deficiency (8.6 on 07/08/24) Re-tested yesterday-awaiting results -Continue MVI with minerals daily -Continue feeding assistance with meals     NUTRITION DIAGNOSIS:   Severe Malnutrition related to chronic illness as evidenced by energy intake < or equal to 75% for > or equal to 1 month, severe muscle depletion.  Ongoing  GOAL:   Patient will meet greater than or equal to 90% of their needs  Met  MONITOR:   PO intake, Supplement acceptance, Weight trends, Skin  REASON FOR ASSESSMENT:   Consult Assessment of nutrition requirement/status  ASSESSMENT:   83 year old male who presented to the ED on 03/04/24 from [facility] due to sacral pain. PMH of T2DM, HTN, HLD, CHF, atrial fibrillation, stroke with right-sided weakness, GERD, BPH, hypothyroidism, MGUS, transthyretin associated amyloid cardiomyopathy, chronic pancytopenia, recent hospital admission for COVID-19. Pt admitted with sepsis secondary to sacral pressure injury infection.  Spoke to pt in room. Also spoke to daughter, Murrell, via phone. Pt with no noted n/v/d, did have some constipation requiring  disimpaction, stool softener 9/14. Last BM 9/16. Pt has been back and forth from regular texture to puree texture diets r/t daughter preferences. Pt with known aspiration risk, some coughing with meals. Pt's daughter most recently requests puree texture diet. Discussed how last time pt was on puree textures he ate minimally for ~2 weeks and we increased back to regular to promote intake. Daughter verbalized understanding. Documented PO intake 64% x 8 meals (mostly reg texture). Daughter is very involved in pt's care, likes to be here for meals when she can. Daughter with questions regarding protein supplements, protein content of foods. Discussed puree food manufacturer, nutrition information available online. Discussed averages on house trays, ability for this RD to look up specific items if needed. Discussed that with current supplement orders pt receiving large amount of protein. Daughter would like to track pt's grams of protein intake herself, as well as know the content so she can choose the highest protein items for him.   Pt continues to be difficult disposition as daughter wants him to transition to home once wound have improved. New area on right upper buttocks. Daughter does not want SNF/LTAC. Pt has been here 155 days, has not shown much improvement to wounds per notes. Palliative has been following, GOC conversations ongoing.    Labs BG 81-167 BUN 31 Ca 8.6 ALK 242 Albumin  2.9 H/H 10.7/31.5  Medications  (feeding supplement) PROSource Plus  30 mL Oral BID BM   amiodarone   200 mg Oral Daily   apixaban   5 mg Oral BID   atorvastatin   10 mg Oral Daily   collagenase    Topical Daily   diclofenac  Sodium  4 g Topical QID  feeding supplement  237 mL Oral BID BM   ferrous sulfate   325 mg Oral QODAY   fluticasone   1 spray Each Nare Daily   furosemide   20 mg Oral Daily   insulin  aspart  0-9 Units Subcutaneous TID WC   levothyroxine   50 mcg Oral Q0600   lidocaine   1 patch Transdermal Q24H    liver oil-zinc  oxide   Topical BID   loratadine   10 mg Oral Daily   multivitamin with minerals  1 tablet Oral Daily   nutrition supplement (JUVEN)  1 packet Oral Daily   nutrition supplement (JUVEN)  1 packet Oral q1600   mouth rinse  15 mL Mouth Rinse 4 times per day   pantoprazole   40 mg Oral Daily   polyethylene glycol  17 g Oral BID   sertraline   50 mg Oral Daily   [START ON 08/08/2024] silver  nitrate applicators  2 Stick Topical Once   sodium chloride  flush  10-40 mL Intracatheter Q12H   sodium phosphate   1 enema Rectal QODAY   sorbitol   30 mL Oral Daily   Tafamidis   1 capsule Oral Daily   vitamin A   10,000 Units Oral Daily   vutrisiran  sodium  25 mg Subcutaneous Q90 days    Diet Order:   Diet Order             DIET - DYS 1 Room service appropriate? Yes; Fluid consistency: Thin  Diet effective now                   EDUCATION NEEDS:   No education needs have been identified at this time  Skin:  Skin Assessment: Skin Integrity Issues: Skin Integrity Issues:: Unstageable, Stage IV, Wound VAC DTI: - Stage II: - Stage IV: lt trochanter (wound vac placed 07/16/24) Unstageable: lt shoulder, rt heel Wound Vac: lt trochanter Other: -  Last BM:  9/16  Height:   Ht Readings from Last 1 Encounters:  03/04/24 5' 11 (1.803 m)    Weight:   Wt Readings from Last 1 Encounters:  08/05/24 74.4 kg    Ideal Body Weight:  78.2 kg  BMI:  Body mass index is 22.87 kg/m.  Estimated Nutritional Needs:   Kcal:  1900-2100  Protein:  100-115g  Fluid:  >/=1.9L  Earl [name], RDN, LDN Registered Dietitian Nutritionist RD Inpatient Contact Info in [place]

## 2024-08-06 NOTE — Plan of Care (Signed)
  Problem: Skin Integrity: Goal: Risk for impaired skin integrity will decrease Outcome: Progressing   Problem: Tissue Perfusion: Goal: Adequacy of tissue perfusion will improve Outcome: Progressing   Problem: Activity: Goal: Risk for activity intolerance will decrease Outcome: Progressing

## 2024-08-06 NOTE — Progress Notes (Signed)
 9/15 note  Earl [name]       FMW:[id number] DOB: 1941-05-14 DOA: 03/04/2024 PCP: [name], MD        Consultants   Brief Narrative: Earl [name] is an 83 year old M with PMH of PAF on Eliquis , HFmrEF, amyloid cardiomyopathy, NIDDM-2, CVA with right-sided weakness, stage IV sacral wound, pancytopenia, MGUS, HTN, HLD and hypothyroidism brought to ED by EMS from [facility] rehab due to pain in sacral area and shortness of breath 4 days after he was discharged from the hospital.  Patient was hospitalized from 3/26-4/10 with generalized weakness, encephalopathy and COVID-19 infection. Patient came back with skin injury suffered during previous hospitalization, and he had significant pain, discomfort and progressive foul-smelling discharge from sacral wound over the course of 4 to 5 days before coming back to ED. he was admitted due to persistent wounds.  General surgery, orthopedic surgery, ID, palliative all consulted.   Patient is in the hospital for prolonged time.     Significant imaging / results / micro data: 4/14-COVID-19 PCR positive.  Flu and RSV PCR nonreactive 4/14-blood cultures NGTD 6/4-sacral wound culture with Pseudomonas aeruginosa and Klebsiella pneumoniae 7/14-pleural fluid culture NGTD   Subjective:   Small stool today-note that overnight was transitioned to dysphagia 3 diet at daughter request and is coughing continuously Re-requested Speech therapy --they are  aware of patient-see the prior charting   Assessment & Plan:   Principal Problem:   Severe sepsis (HCC) Active Problems:   Paroxysmal atrial fibrillation (HCC)   Sepsis (HCC)   Atrial flutter (HCC)   Sacral ulcer (HCC)   MGUS (monoclonal gammopathy of unknown significance)   Essential hypertension   Hyperlipidemia   Insulin  dependent type 2 diabetes mellitus (HCC)   History of CVA (cerebrovascular accident)   Cardiac amyloidosis (HCC)   Chronic systolic CHF (congestive heart failure)  (HCC)   GERD (gastroesophageal reflux disease)   History of hypertension   Atrial fibrillation with RVR (HCC)   Community acquired pneumonia   Protein-calorie malnutrition, severe   Wild-type transthyretin-related (ATTR) amyloidosis (HCC)   Sacral decubitus ulcer, stage IV (HCC)   Pressure injury of skin of trochanteric region of left hip    Stage IV sacral decubitus wound/ulcer with severe sepsis on admission: Treated as below. - Culture data as above - 4/14-CT showed sacral wound extension to the right gluteus muscle without signs of osteomyelitis. - 4/15-underwent bedside debridement of sacral decubitus wound by general surgery - 4/21-ID consulted and recommended continuing broad-spectrum antibiotics that he completed on 6/13. 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 - 6/17- ID recommended continuing holding off antibiotics since he is hemodynamically stable, afebrile without leukocytosis. - 6/17-evaluated by plastic surgery.  Sacral wound felt to be clean.  Wound care recommended - 7/13-7/13-CT showed similar appearance of osteomyelitis underlying sacral decubitus ulcer.  - 7/13-plastic surgery recommended for continued wound care and no surgical management. - 7/14-Ortho evaluated patient for left hip wound.  Debulked and recommended optimizing nutrition - 8/5- Surgery recommended hydrotherapy for left hip wound. Surgical debridement if not successful - 8/5-wound care and instruction updated  - 8/15-left hip wound worse with tunneling. X-ray with left lateral hip soft tissue defect.  No acute bony abnormality.  -Overall, difficult case due to patient's nutritional status and limited mobility.  Prealbumin <5. - 8/15 with CT showed left hip wound which extends deep, 2.8 cm without evidence of acute osteolysis involving the underlying left greater trochanter.   - 07/12/2024:  Ortho re-consult-- conservative management.   Continue wound care as per wound care consult recommendations. Wounds last exmamined by me 9/13 with Daughter present-- Per WOC RN 9/14 1- Left shoulder Unstageable Cleanse with NS, pat dry, Apply 1/4 thick layer of Santyl  to wound bed, top with saline moist gauze. Top with silicone foam. Change daily. 2- Left trochanter: NPWT WOC will change 2x weekly 3 - Sacral/ L buttocks: Cleanse with Vashe ([id number]) Place Aquacel AG (lawson # [id number]) over wound bed, tuck into the undermining area using a cotton tipped applicator. Top with silicone foam. Change daily. 3- Right heel; Unstageable; Cleanse with NS, pat dry. Apply Aquacel Ag+ (silver  hydrofiber) Lawson # [id number], ensure tucked into the edges where eschar is lifting. Cover with silicone foam dressing. Change daily. Off load bilateral heels with Prevalon boots at all times. LALM in place for moisture management and pressure redistribution.    Severe constipation -disimpacted 9/11--scheduled sorbitol --PRN Soap suds Vs [name] enema if no stool in 48 hours despite Sorbitol  Might need disimpaction 9/15 if no stool   Aspiration pneumonia  - Patient with worsening congestion, increased cough on 8/18.   - Chest x-ray 8/18 with lower lobes collapsed ? pneumonia, likely due to being in bed for prolonged amount of time.   - Completed 5 days of Rocephin  and Flagyl  on 07/12/2024. - Diet as per SLP's recommendations--currently dysphagia 3-SLP to revisit and discussed with family - Nutritionist to see patient and talk with family about options for increasing protein in diet Overall remains at risk for aspiration in general and prognosis may be guarded because of this   Acute on chronic combined CHF, cardiac amyloidosis, nonischemic cardiomyopathy, hypotension  - On tafamidis  and vutrisiran  for amyloid.  GDMT limited 2/2 prior episodes of hypotension.   - Has received few doses of IV Lasix  recently along with IV albumin .  Continue Lasix  20 p.o. daily - Continue  Tafamidis  and vutrisiran . - Ace wrap for BLE edema, also in Prevalon boots - periodic checks of kidney function and electrolytes, stable now.   Elevated LFTs  - Lipitor reintroduced.  LFTs show slight elevaiton alk phos--likely bony remodelling   PAF - Continue amiodarone  200 daily-and Eliquis  5 twice daily.  Currently rate controlled.     History of CVA - Stable, on Eliquis  5 bid, back on Lipitor 10.   Dysphagia/severe protein calorie malnutrition - Continue supplementation as able diet changed nursing has been sensitized to keeping him sitting up We changed the Juven to different times on 9/13 so he doesn't get full on snacks and eats food   Physical debilitation, muscle weakness - PT/OT ongoing.  Challenging with getting debilitated every day.   Hypothyroidism  -Continue levothyroxine  50 mcg   IDDM  - Continue sliding scale only .  Hypoglycemic episode 9/5 night.  On sliding scale insulin .  No more on long-acting insulin .  Encourage nutrition.   Goals of care  - Daughter wants him to transition to home once his wounds are better.   - Progressive pictures of wounds show a new area on the right upper buttock  - Daughter does not want SNF or LTAC.  Palliative care  Dr. Marvine to follow-up on 9/16    Resolved/chronic issues Severe sepsis  - Present on admission.  Resolved. Anxiety and adjustment disorder  - Psychiatry consulted on 7/7 and recommended continuing Zoloft  50 daily.  Psychiatry has signed off. Anemia of chronic disease - From chronic illnesses.  Monitor intermittently.  No signs of bleeding. Ferrous  sulphate changed to q0d [can constipate patients] Acute on chronic pain - Continue current pain management with lidocaine  patch and as needed oxycodone  2.5 3 times daily as needed Concern for cholecystitis-unfounded - Ruled out, right upper quadrant ultrasound initially raise concern, however HIDA scan was negative.  This is likely due to anasarca/third spacing.        DVT prophylaxis:  SCDs Start: 03/04/24 2151 Place TED hose Start: 03/04/24 2151 apixaban  (ELIQUIS ) tablet 5 mg  Code Status:   Code Status: Limited: Do not attempt resuscitation (DNR) -DNR-LIMITED -Do Not Intubate/DNI  Level of care: Telemetry Medical Status is: Inpatient     Objective:       Vitals:    08/05/24 0500 08/05/24 0525 08/05/24 0900 08/05/24 1528  BP:   108/63 116/62 (!) 100/57  Pulse:   69 77 68  Resp:   17 16 18   Temp:   (!) 97.4 F (36.3 C)      TempSrc:   Oral      SpO2:   98% 100%    Weight: 74.4 kg        Height:              Intake/Output Summary (Last 24 hours) at 08/05/2024 1657 Last data filed at 08/05/2024 0525    Gross per 24 hour  Intake 400 ml  Output 1300 ml  Net -900 ml         Filed Weights    08/01/24 0500 08/04/24 0450 08/05/24 0500  Weight: 70.8 kg 73.9 kg 74.4 kg    Body mass index is 22.87 kg/m.   Examination:    Weak coughing a little bit Chest is clear Abdomen nontender upper leg edema ROM intact Overall looks weak   I have personally reviewed the following labs and images: CBC: Last Labs       Recent Labs  Lab 08/01/24 0536 08/02/24 0534 08/03/24 0520  WBC 5.3 7.6 6.0  NEUTROABS 3.0 5.2 3.7  HGB 10.5* 10.6* 10.7*  HCT 31.3* 31.8* 31.5*  MCV 87.9 89.3 88.7  PLT 234 244 239      BMP &GFR Last Labs        Recent Labs  Lab 08/01/24 0536 08/02/24 0534 08/03/24 0520 08/05/24 0507  NA 140 141 139 140  K 4.1 4.7 4.8 4.2  CL 104 103 103 104  CO2 25 23 26 26   GLUCOSE 110* 137* 121* 125*  BUN 23 25* 26* 31*  CREATININE 0.79 0.81 0.83 0.87  CALCIUM  8.4* 8.2* 8.8* 8.6*      Estimated Creatinine Clearance: 67.7 mL/min (by C-G formula based on SCr of 0.87 mg/dL). Liver & Pancreas: Last Labs       Recent Labs  Lab 08/01/24 0536 08/02/24 0534 08/03/24 0520  AST 33 39 34  ALT 21 22 22   ALKPHOS 240* 252* 242*  BILITOT 1.9* 2.7* 2.3*  PROT 5.4* 5.9* 5.9*  ALBUMIN  2.3* 2.9* 2.9*         Diabetic: Recent Labs (last 2 labs)  No results for input(s): HGBA1C in the last 72 hours.   Last Labs         Recent Labs  Lab 08/04/24 1628 08/04/24 2111 08/05/24 0817 08/05/24 1201 08/05/24 1551  GLUCAP 172* 195* 101* 153* 167*        Urine analysis: Labs (Brief)          Component Value Date/Time    COLORURINE STRAW (A) 03/04/2024 2139    APPEARANCEUR CLEAR 03/04/2024  2139    LABSPEC 1.016 03/04/2024 2139    PHURINE 6.0 03/04/2024 2139    GLUCOSEU >=500 (A) 03/04/2024 2139    HGBUR SMALL (A) 03/04/2024 2139    BILIRUBINUR NEGATIVE 03/04/2024 2139    KETONESUR NEGATIVE 03/04/2024 2139    PROTEINUR 30 (A) 03/04/2024 2139    UROBILINOGEN 0.2 05/05/2015 1443    NITRITE NEGATIVE 03/04/2024 2139    LEUKOCYTESUR NEGATIVE 03/04/2024 2139          Scheduled Meds:  (feeding supplement) PROSource Plus  30 mL Oral BID BM   amiodarone   200 mg Oral Daily   apixaban   5 mg Oral BID   atorvastatin   10 mg Oral Daily   collagenase    Topical Daily   diclofenac  Sodium  4 g Topical QID   feeding supplement  237 mL Oral BID BM   ferrous sulfate   325 mg Oral QODAY   fluticasone   1 spray Each Nare Daily   furosemide   20 mg Oral Daily   insulin  aspart  0-9 Units Subcutaneous TID WC   levothyroxine   50 mcg Oral Q0600   lidocaine   1 patch Transdermal Q24H   liver oil-zinc  oxide   Topical BID   loratadine   10 mg Oral Daily   multivitamin with minerals  1 tablet Oral Daily   nutrition supplement (JUVEN)  1 packet Oral Daily   nutrition supplement (JUVEN)  1 packet Oral q1600   mouth rinse  15 mL Mouth Rinse 4 times per day   pantoprazole   40 mg Oral Daily   polyethylene glycol  17 g Oral BID   sertraline   50 mg Oral Daily   [START ON 08/08/2024] silver  nitrate applicators  2 Stick Topical Once   sodium chloride  flush  10-40 mL Intracatheter Q12H   sodium phosphate   1 enema Rectal QODAY   sorbitol   30 mL Oral Daily   Tafamidis   1 capsule Oral Daily   vitamin A   10,000 Units  Oral Daily   vutrisiran  sodium  25 mg Subcutaneous Q90 days        Continuous Infusions:         LOS: 154 days    18 minutes   Jai-Gurmukh Elvina Bosch, MD [facility] [email or web address] 08/05/2024, 4:57 PM

## 2024-08-07 DIAGNOSIS — A419 Sepsis, unspecified organism: Secondary | ICD-10-CM | POA: Diagnosis not present

## 2024-08-07 DIAGNOSIS — R6520 Severe sepsis without septic shock: Secondary | ICD-10-CM | POA: Diagnosis not present

## 2024-08-07 LAB — COMPREHENSIVE METABOLIC PANEL WITH GFR
ALT: 18 U/L (ref 0–44)
AST: 30 U/L (ref 15–41)
Albumin: 2.3 g/dL — ABNORMAL LOW (ref 3.5–5.0)
Alkaline Phosphatase: 233 U/L — ABNORMAL HIGH (ref 38–126)
Anion gap: 11 (ref 5–15)
BUN: 30 mg/dL — ABNORMAL HIGH (ref 8–23)
CO2: 21 mmol/L — ABNORMAL LOW (ref 22–32)
Calcium: 8 mg/dL — ABNORMAL LOW (ref 8.9–10.3)
Chloride: 103 mmol/L (ref 98–111)
Creatinine, Ser: 0.76 mg/dL (ref 0.61–1.24)
GFR, Estimated: >60 mL/min (ref >60)
Glucose, Bld: 124 mg/dL — ABNORMAL HIGH (ref 70–99)
Potassium: 4 mmol/L (ref 3.5–5.1)
Sodium: 135 mmol/L (ref 135–145)
Total Bilirubin: 2 mg/dL — ABNORMAL HIGH (ref 0.0–1.2)
Total Protein: 5.1 g/dL — ABNORMAL LOW (ref 6.5–8.1)

## 2024-08-07 LAB — GLUCOSE, CAPILLARY
Glucose-Capillary: 103 mg/dL — ABNORMAL HIGH (ref 70–99)
Glucose-Capillary: 162 mg/dL — ABNORMAL HIGH (ref 70–99)
Glucose-Capillary: 156 mg/dL — ABNORMAL HIGH (ref 70–99)
Glucose-Capillary: 122 mg/dL — ABNORMAL HIGH (ref 70–99)

## 2024-08-07 NOTE — Progress Notes (Addendum)
 Pt A/O x4, Room air.   Daughter present at beside.  Large BM today during the day per day shift RN; as well as another Large bowel movement x2 overnight following turn to R side off wounds. Sacral wound cleansed with vashe, aquacel AG packed to the wound bed, including tunneling/ edges-dressing changed and dated x2 overnight.   PRN medications given for pain control.

## 2024-08-07 NOTE — Plan of Care (Signed)
  Problem: Fluid Volume: Goal: Hemodynamic stability will improve Outcome: Progressing   Problem: Clinical Measurements: Goal: Diagnostic test results will improve Outcome: Progressing Goal: Signs and symptoms of infection will decrease Outcome: Progressing   Problem: Education: Goal: Knowledge of risk factors and measures for prevention of condition will improve Outcome: Progressing   Problem: Coping: Goal: Psychosocial and spiritual needs will be supported Outcome: Progressing   Problem: Respiratory: Goal: Will maintain a patent airway Outcome: Progressing Goal: Complications related to the disease process, condition or treatment will be avoided or minimized Outcome: Progressing   Problem: Education: Goal: Ability to describe self-care measures that may prevent or decrease complications (Diabetes Survival Skills Education) will improve Outcome: Progressing   Problem: Coping: Goal: Ability to adjust to condition or change in health will improve Outcome: Progressing   Problem: Fluid Volume: Goal: Ability to maintain a balanced intake and output will improve Outcome: Progressing   Problem: Health Behavior/Discharge Planning: Goal: Ability to manage health-related needs will improve Outcome: Progressing   Problem: Metabolic: Goal: Ability to maintain appropriate glucose levels will improve Outcome: Progressing   Problem: Nutritional: Goal: Maintenance of adequate nutrition will improve Outcome: Progressing Goal: Progress toward achieving an optimal weight will improve Outcome: Progressing   Problem: Skin Integrity: Goal: Risk for impaired skin integrity will decrease Outcome: Progressing   Problem: Tissue Perfusion: Goal: Adequacy of tissue perfusion will improve Outcome: Progressing   Problem: Education: Goal: Knowledge of General Education information will improve Description: Including pain rating scale, medication(s)/side effects and non-pharmacologic  comfort measures Outcome: Progressing   Problem: Health Behavior/Discharge Planning: Goal: Ability to manage health-related needs will improve Outcome: Progressing   Problem: Clinical Measurements: Goal: Ability to maintain clinical measurements within normal limits will improve Outcome: Progressing Goal: Will remain free from infection Outcome: Progressing Goal: Diagnostic test results will improve Outcome: Progressing Goal: Respiratory complications will improve Outcome: Progressing Goal: Cardiovascular complication will be avoided Outcome: Progressing   Problem: Activity: Goal: Risk for activity intolerance will decrease Outcome: Progressing   Problem: Nutrition: Goal: Adequate nutrition will be maintained Outcome: Progressing   Problem: Coping: Goal: Level of anxiety will decrease Outcome: Progressing   Problem: Elimination: Goal: Will not experience complications related to bowel motility Outcome: Progressing Goal: Will not experience complications related to urinary retention Outcome: Progressing   Problem: Pain Managment: Goal: General experience of comfort will improve and/or be controlled Outcome: Progressing   Problem: Safety: Goal: Ability to remain free from injury will improve Outcome: Progressing   Problem: Skin Integrity: Goal: Risk for impaired skin integrity will decrease Outcome: Progressing

## 2024-08-07 NOTE — Progress Notes (Signed)
 Mobility Specialist: Progress Note   08/07/24 1225  Therapy Vitals  Temp 97.6 F (36.4 C)  Temp Source Oral  Pulse Rate 73  Resp 18  BP 118/63  Patient Position (if appropriate) Lying  Oxygen Therapy  SpO2 97 %  O2 Device Room Air  Mobility  Activity  (LE HEP)  Range of Motion/Exercises Active Assistive;Active;Right leg;Left leg  Activity Response Tolerated well  Mobility Referral Yes  Mobility visit 1 Mobility  Mobility Specialist Start Time (ACUTE ONLY) 1130  Mobility Specialist Stop Time (ACUTE ONLY) 1158  Mobility Specialist Time Calculation (min) (ACUTE ONLY) 28 min    Pt received in bed, agreeable to mobility session. Performed LE exercises with assistance, tolerated well. Left in bed with all needs met, call bell in reach.    LE: assisted leg lifts (10x each leg), assisted heel slides and knee extension (10x each leg), bicycles (3 rounds, 30s each)    Ileana Lute Mobility Specialist Please contact via SecureChat or Rehab office at [phone]

## 2024-08-07 NOTE — Progress Notes (Addendum)
  Progress Note   Patient: Earl [name] FMW:[id number] DOB: 08/29/1941 DOA: 03/04/2024     156 DOS: the patient was seen and examined on 08/07/2024        Brief hospital course: 83 y.o. M with pAF on Eliquis , amyloid cardiomyopathy, HFmrEF, DM, hx stroke, HTN, and hypothyroidism who presented with sepsis from sacral decubitus ulcer.  See longer summary by Dr. Royal from 9/16    Assessment and Plan: Chronic heart failure with mildly reduced ejection fraction Anasarca due to malnutrition, doubt active CHF. - Continue tafamidis , furosemide    Atrial fibrillation Cerebrovascular disease HR controlled - Continue amiodarone , Eliquis , atorvastatin   Sacral wound Severe protein calorie malnutrition Anasarca - WOC - Continue nutritional supplements - Okay for nutrition supplements  Hypothyroidism - Continue levothyroxine   Diabetes Glucose controled - Continue SS corrections  Mood disorder - Continue Zoloft   Anemia of chronic disease Hgb has been stable, no bleeding reported or observed         Subjective: Feels uncomfortable sitting on a rail, but when I feel under his legs, and hte pillows he is sitting on, all I can feel is the air mattress. Also reports that he has been waiting for someone to help him eat.  No other complaints, no nursing concerns.     Physical Exam: BP 118/63 (BP Location: Right Arm)   Pulse 73   Temp 97.6 F (36.4 C) (Oral)   Resp 18   Ht 5' 11 (1.803 m)   Wt 74.4 kg   SpO2 97%   BMI 22.87 kg/m   Frail elderly adult male, lying in bed, no acute distress, voice hypophonic RRR no murmurs Voice hypophonic and voice congested.  Lungs diminished, no rales or wheezing appreciated, poor air movement Abdomen soft, no TTP Attention normal, very hard to hear, severe generalized weakness, 3/5 in both arms, 1/5 in both legs    Data Reviewed: BMP shows no change    Family Communication: Daughter    Disposition: Status is:  Inpatient         Author: Lonni SHAUNNA Dalton, MD 08/07/2024 3:09 PM  For on call review [email or web address].

## 2024-08-08 DIAGNOSIS — A419 Sepsis, unspecified organism: Secondary | ICD-10-CM | POA: Diagnosis not present

## 2024-08-08 DIAGNOSIS — R6520 Severe sepsis without septic shock: Secondary | ICD-10-CM | POA: Diagnosis not present

## 2024-08-08 LAB — GLUCOSE, CAPILLARY
Glucose-Capillary: 97 mg/dL (ref 70–99)
Glucose-Capillary: 84 mg/dL (ref 70–99)
Glucose-Capillary: 158 mg/dL — ABNORMAL HIGH (ref 70–99)
Glucose-Capillary: 171 mg/dL — ABNORMAL HIGH (ref 70–99)
Glucose-Capillary: 149 mg/dL — ABNORMAL HIGH (ref 70–99)

## 2024-08-08 MED ORDER — OXYMETAZOLINE HCL 0.05 % NA SOLN
1.0000 | Freq: Two times a day (BID) | NASAL | Status: DC
  Filled 2024-08-08: qty 30

## 2024-08-08 NOTE — Plan of Care (Signed)
   Problem: Fluid Volume: Goal: Hemodynamic stability will improve Outcome: Progressing   Problem: Clinical Measurements: Goal: Diagnostic test results will improve Outcome: Progressing Goal: Signs and symptoms of infection will decrease Outcome: Progressing   Problem: Clinical Measurements: Goal: Signs and symptoms of infection will decrease Outcome: Progressing

## 2024-08-08 NOTE — Progress Notes (Signed)
  Progress Note   Patient: Earl [name] FMW:[id number] DOB: 1941/04/02 DOA: 03/04/2024     157 DOS: the patient was seen and examined on 08/08/2024        Brief hospital course: 83 y.o. M with amyloid CM, pAF on Eliquis , vascular disease, heart failure, diabetes presented with sepsis from decubitus ulcer.     Assessment and Plan: CHF Less arm edema today. No LE edema.  Stable on room air - Continue oral furosemide , tafamadis  Sacral wound Severe PCM Anasarca Discussed with daughter.  Discussed with SLP.  Agree with plan.  Regret that logistics somewhat limit what we can provide for him diet-wise, but it appears that dietitian and daughter's current arrangements are superior. - WOC - Continue nutrition supplements, including those from home  Hypothyroidism - Continue LT4  Mood disorder - Continue sertraline   Anemia of chronic disease - Trend   Afib Cerebrovascular disease Diabetes Glucoses controlled, HR normal - Continue amiodarone , Eliquis  - Continue atorvastatin  - Continue SS correction insulin       Subjective: No clinical change.  No nursing concerns.     Physical Exam: BP 117/76 (BP Location: Right Arm)   Pulse 78   Temp (!) 97.2 F (36.2 C)   Resp 18   Ht 5' 11 (1.803 m)   Wt 72.1 kg   SpO2 100%   BMI 22.18 kg/m   Alert to my presence, responds to questions RRR no murmurs, arm pitting is improved today Lungs diminished, no rales or wheezing, limited chest excursion Abdomen soft, no TTP, no distension Attention normal, voice hypophonic, generalized weakness       Family Communication: Daughter by phone    Disposition: Status is: Inpatient         Author: Lonni SHAUNNA Dalton, MD 08/08/2024 5:54 PM  For on call review [email or web address].

## 2024-08-08 NOTE — Consult Note (Signed)
 WOC Nurse wound follow up Wound type: Stage 4 pressure injury to Sacrum Measurement: 6 cm x 3 cm x 0.5 cm with 1 cm undermining  from 12 o'clock to 2 o'clock.  Wound bed: 90% red, moist, 10 % yellow slough, edges with epibole from 12 o'clock to 2 o'clock and 6 o'clock to 8 o'clock Drainage (amount, consistency, odor) serosanguinous  Periwound: intact Dressing procedure/placement/frequency:  Silver  Nitrate applied to epibole edges from 12 o'clock to 2 o'clock and 6 o'clock to 8 o'clock. Aquacel AQ applied to wound bed, tucked into undermining area utilizing a cotton tipped applicator and covered with silicone foam dressing.     WOC Nurse team will follow with you and see patient within 10 days for wound assessments.  Please notify WOC nurses of any acute changes in the wounds or any new areas of concern   Thank you,  Doyal Polite, RN, MSN, [facility] WOC Team [phone] (Available Mon-Fri 0700-1500)

## 2024-08-08 NOTE — Progress Notes (Signed)
 SLP note:  SLP has followed Earl [name] intermittently during his admission. He was last D/Cd from our service on 07/24/24 with recs to continue a regular diet with thin liquids with known risk of aspiration and its potential consequences.  He is currently on a dysphagia 1 diet (see SLP note 9/16).   Enlisted help of Earl [name], Production designer, theatre/television/film of Clinical Nutrition, to address some of the diet concerns posed by pt's daughter, Earl [name]. We spoke with Earl [name] by phone. Earl [name] and I subsequently spoke with Earl [name], Copy.   Earl [name] voiced concerns that when her father is on a regular diet, some staff cannot offer the time it takes to feed him and that if he coughs, staff will stop feeding him.  A pureed diet is limited in its offerings and she would like to be able to feed him regular foods when she is present.  The situation was discussed at length with Earl [name] and Earl [name] - unfortunately, due to constraints of the system, there is no way to accommodate a hybrid diet that would allow purees when staff is present to feed and regular solids when family is present to feed.   After discussion with Earl [name], the decision was made to maintain Earl [name] on an official diet of dysphagia 1 with thin liquids.  She would like to bring in supplemental POs for her father when she is here with him.   There are no further acute care SLP needs. Our service will sign off.   Earl [name] L. Vona, MA CCC/SLP Clinical Specialist - Acute Care SLP Acute Rehabilitation Services Office number [phone]

## 2024-08-08 NOTE — Progress Notes (Signed)
 Wound care performed to sacral wound after incontinent episode of stool.

## 2024-08-08 NOTE — Consult Note (Signed)
 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 20% yellow slough Drainage (amount, consistency, odor) serosanguinous in cannister Periwound: intact Dressing procedure/placement/frequency:  Removed old NPWT dressing   Filled wound with  __1__ piece of black foam  Sealed NPWT dressing at HG  Patient tolerated procedure well   WOC nurse will continue to provide NPWT dressing changed due to the complexity of the dressing change.   Doyal Polite, RN, MSN, [facility] WOC Team [phone] (Available Mon-Fri 0700-1500)

## 2024-08-08 NOTE — Plan of Care (Signed)
   Problem: Fluid Volume: Goal: Hemodynamic stability will improve Outcome: Progressing

## 2024-08-08 NOTE — Progress Notes (Signed)
 Nutrition Note  Clinical nutrition has followed the pt throughout his admission. This RD has been following the pt for approximately two months. During this time pt has been back and forth between regular texture and pureed texture per SLP evaluations as well as daughter's request, with known aspiration risk and potential consequences of both diet textures. Most recently pt was changed back to puree texture r/t daughter concerns that when Mr. Tuckett is on a regular diet, some staff cannot offer the increased time it takes to feed him, and if he does cough, staff will stop feeding him. A pureed diet does have limited options available for the pt, and the pt's daughter would like to feed him regular texture items, but only while she is present.   Situation discussed extensively with Alan Snuffer, MA CCC/SLP, and Particia Blackwater, Manager of Clinical Nutrition, to address Para's concerns regarding her father's dietary needs. Dima and Alan discussed situation and concerns with Para via phone with this RD joining partway through phone call. Then subsequently Dima and Alan discussed situation with Laymon Gruber, Museum/gallery exhibitions officer and Nutrition Services the feasibility of a hybrid type diet, which was ultimately deemed not possible with the current systems in place.   After Dima and Amanda again discussed with Para, the decision was made to maintain the pt on a dysphagia 1 diet with thin liquids officially. Para then would like to bring in supplemental upgraded POs for her father when she is here visiting.   Clinical nutrition will continue to monitor pt's nutritional status. Please see RD note from 9/16 for continued nutritional assessment and further discussions with Para regarding the pt's nutritional needs.   Alroy Portela Daml-Budig, RDN, LDN Registered Dietitian Nutritionist RD Inpatient Contact Info in [place]

## 2024-08-09 LAB — GLUCOSE, CAPILLARY
Glucose-Capillary: 118 mg/dL — ABNORMAL HIGH (ref 70–99)
Glucose-Capillary: 128 mg/dL — ABNORMAL HIGH (ref 70–99)
Glucose-Capillary: 186 mg/dL — ABNORMAL HIGH (ref 70–99)
Glucose-Capillary: 197 mg/dL — ABNORMAL HIGH (ref 70–99)
Glucose-Capillary: 99 mg/dL (ref 70–99)

## 2024-08-09 NOTE — Progress Notes (Signed)
 This is a no charge note.  Earl [name] was seen and examined.  Discussed with nursing.  No clinical change.  Requested assistance with his bluetooth music speaker.  I tried to fix this but couldn't see how it worked. Have referred to his nurse, who was more familiar with the device. No change to plan.  Continue nursing wound cares, nutritional efforts and physical therapy.

## 2024-08-09 NOTE — Progress Notes (Signed)
 Arrival of Amvuttra  remains pending per Dtr; states the Rx will be delivered to their house when available. Will continue to monitor.

## 2024-08-09 NOTE — Plan of Care (Signed)
  Problem: Fluid Volume: Goal: Hemodynamic stability will improve Outcome: Progressing   Problem: Clinical Measurements: Goal: Diagnostic test results will improve Outcome: Progressing Goal: Signs and symptoms of infection will decrease Outcome: Progressing   Problem: Education: Goal: Knowledge of risk factors and measures for prevention of condition will improve Outcome: Progressing   Problem: Coping: Goal: Psychosocial and spiritual needs will be supported Outcome: Progressing   Problem: Respiratory: Goal: Will maintain a patent airway Outcome: Progressing Goal: Complications related to the disease process, condition or treatment will be avoided or minimized Outcome: Progressing   Problem: Education: Goal: Ability to describe self-care measures that may prevent or decrease complications (Diabetes Survival Skills Education) will improve Outcome: Progressing   Problem: Coping: Goal: Ability to adjust to condition or change in health will improve Outcome: Progressing   Problem: Fluid Volume: Goal: Ability to maintain a balanced intake and output will improve Outcome: Progressing   Problem: Health Behavior/Discharge Planning: Goal: Ability to manage health-related needs will improve Outcome: Progressing   Problem: Metabolic: Goal: Ability to maintain appropriate glucose levels will improve Outcome: Progressing   Problem: Nutritional: Goal: Maintenance of adequate nutrition will improve Outcome: Progressing Goal: Progress toward achieving an optimal weight will improve Outcome: Progressing   Problem: Skin Integrity: Goal: Risk for impaired skin integrity will decrease Outcome: Progressing   Problem: Tissue Perfusion: Goal: Adequacy of tissue perfusion will improve Outcome: Progressing   Problem: Education: Goal: Knowledge of General Education information will improve Description: Including pain rating scale, medication(s)/side effects and non-pharmacologic  comfort measures Outcome: Progressing   Problem: Health Behavior/Discharge Planning: Goal: Ability to manage health-related needs will improve Outcome: Progressing   Problem: Clinical Measurements: Goal: Ability to maintain clinical measurements within normal limits will improve Outcome: Progressing Goal: Will remain free from infection Outcome: Progressing Goal: Diagnostic test results will improve Outcome: Progressing Goal: Respiratory complications will improve Outcome: Progressing Goal: Cardiovascular complication will be avoided Outcome: Progressing   Problem: Activity: Goal: Risk for activity intolerance will decrease Outcome: Progressing   Problem: Nutrition: Goal: Adequate nutrition will be maintained Outcome: Progressing   Problem: Coping: Goal: Level of anxiety will decrease Outcome: Progressing   Problem: Elimination: Goal: Will not experience complications related to bowel motility Outcome: Progressing Goal: Will not experience complications related to urinary retention Outcome: Progressing   Problem: Pain Managment: Goal: General experience of comfort will improve and/or be controlled Outcome: Progressing   Problem: Safety: Goal: Ability to remain free from injury will improve Outcome: Progressing   Problem: Skin Integrity: Goal: Risk for impaired skin integrity will decrease Outcome: Progressing

## 2024-08-09 NOTE — Progress Notes (Signed)
 Mobility Specialist: Progress Note   08/09/24 1451  Mobility  Activity  (HEP)  Range of Motion/Exercises Active;Active Assistive;Right arm;Left arm  Activity Response Tolerated well  Mobility Referral Yes  Mobility visit 1 Mobility  Mobility Specialist Start Time (ACUTE ONLY) 1135  Mobility Specialist Stop Time (ACUTE ONLY) 1159  Mobility Specialist Time Calculation (min) (ACUTE ONLY) 24 min     Pt received in bed, agreeable to mobility session. Performed UE exercises with therabands, tolerated well. Left in bed with all needs met, call bell in reach.    UE (with theraband): chest press, crossbody punches, front raises (15x each), assisted sit-ups with gait belt (15x)  Ileana Lute Mobility Specialist Please contact via SecureChat or Rehab office at [phone]

## 2024-08-09 NOTE — Plan of Care (Signed)
   Problem: Fluid Volume: Goal: Hemodynamic stability will improve Outcome: Progressing

## 2024-08-10 DIAGNOSIS — A419 Sepsis, unspecified organism: Secondary | ICD-10-CM | POA: Diagnosis not present

## 2024-08-10 DIAGNOSIS — R6520 Severe sepsis without septic shock: Secondary | ICD-10-CM | POA: Diagnosis not present

## 2024-08-10 LAB — GLUCOSE, CAPILLARY
Glucose-Capillary: 106 mg/dL — ABNORMAL HIGH (ref 70–99)
Glucose-Capillary: 174 mg/dL — ABNORMAL HIGH (ref 70–99)
Glucose-Capillary: 164 mg/dL — ABNORMAL HIGH (ref 70–99)

## 2024-08-10 MED ORDER — SORBITOL 70 % SOLN
30.0000 mL | Freq: Every day | Status: DC
  Administered 2024-08-12 – 2024-08-21 (×10): 30 mL via ORAL
  Filled 2024-08-10 (×10): qty 30

## 2024-08-10 MED ORDER — POLYETHYLENE GLYCOL 3350 17 G PO PACK
17.0000 g | PACK | Freq: Every day | ORAL | Status: DC
  Administered 2024-08-14 – 2024-08-21 (×8): 17 g via ORAL
  Filled 2024-08-10 (×10): qty 1

## 2024-08-10 NOTE — Plan of Care (Signed)
  Problem: Coping: Goal: Level of anxiety will decrease Outcome: Progressing   Problem: Elimination: Goal: Will not experience complications related to bowel motility Outcome: Progressing   Problem: Pain Managment: Goal: General experience of comfort will improve and/or be controlled Outcome: Progressing   Problem: Skin Integrity: Goal: Risk for impaired skin integrity will decrease Outcome: Not Progressing   Problem: Activity: Goal: Risk for activity intolerance will decrease Outcome: Not Progressing   Problem: Skin Integrity: Goal: Risk for impaired skin integrity will decrease Outcome: Not Progressing

## 2024-08-10 NOTE — Progress Notes (Signed)
  Progress Note   Patient: Earl [name] FMW:[id number] DOB: 05/01/1941 DOA: 03/04/2024     159 DOS: the patient was seen and examined on 08/10/2024        Brief hospital course: 83 yo M with transthyretin cardiac amyloidosis, CHF, HTN, HLD, PAF on amiodarone  and Eliquis , history of stroke, chronic pancytopenia admitted for sepsis from sacral decubitus ulcer.     Assessment and Plan: CHF - Continue furosemide , tafamidis  - Dose quarterly Amvuttra  when arrives from home  Sacral decubitus ulcer Severe protein calorie malnutrition and anasarca - Continue nutrition supplements, wound cares  Hypothyroidism - Continue levothyroxine   Atrial fibrillation Cerebrovascular disease - Continue Eliquis , amiodarone , Lipitor  Diabetes Glucoses controlled - Continue sliding scale correction insulin             Subjective: No new change, his voice is sounding clear to me.  He is listening to music, asks for a pillow under his right arm.     Physical Exam: BP 139/73   Pulse 72   Temp 97.7 F (36.5 C) (Oral)   Resp 18   Ht 5' 11 (1.803 m)   Wt 72.1 kg   SpO2 100%   BMI 22.18 kg/m   Adult male, stably debilitated, responds appropriately to questions, pleasant demeanor RRR, no murmurs, minimal pitting in the right arm, no lower extremity pitting Lung sounds diminished but no rales or wheezes appreciated, voice hypophonic, but improved from yesterday Abdomen soft, no tenderness to palpation    Family Communication: Will call daughter this afternoon    Disposition: Status is: Inpatient         Author: Lonni SHAUNNA Dalton, MD 08/10/2024 1:02 PM  For on call review [email or web address].

## 2024-08-11 DIAGNOSIS — A419 Sepsis, unspecified organism: Secondary | ICD-10-CM | POA: Diagnosis not present

## 2024-08-11 DIAGNOSIS — R6520 Severe sepsis without septic shock: Secondary | ICD-10-CM | POA: Diagnosis not present

## 2024-08-11 LAB — GLUCOSE, CAPILLARY
Glucose-Capillary: 103 mg/dL — ABNORMAL HIGH (ref 70–99)
Glucose-Capillary: 137 mg/dL — ABNORMAL HIGH (ref 70–99)
Glucose-Capillary: 194 mg/dL — ABNORMAL HIGH (ref 70–99)
Glucose-Capillary: 196 mg/dL — ABNORMAL HIGH (ref 70–99)
Glucose-Capillary: 97 mg/dL (ref 70–99)

## 2024-08-11 NOTE — Plan of Care (Signed)
  Problem: Fluid Volume: Goal: Hemodynamic stability will improve Outcome: Progressing   Problem: Clinical Measurements: Goal: Diagnostic test results will improve Outcome: Progressing Goal: Signs and symptoms of infection will decrease Outcome: Progressing   Problem: Education: Goal: Knowledge of risk factors and measures for prevention of condition will improve Outcome: Progressing   Problem: Coping: Goal: Psychosocial and spiritual needs will be supported Outcome: Progressing   Problem: Respiratory: Goal: Will maintain a patent airway Outcome: Progressing Goal: Complications related to the disease process, condition or treatment will be avoided or minimized Outcome: Progressing   Problem: Education: Goal: Ability to describe self-care measures that may prevent or decrease complications (Diabetes Survival Skills Education) will improve Outcome: Progressing   Problem: Coping: Goal: Ability to adjust to condition or change in health will improve Outcome: Progressing   Problem: Fluid Volume: Goal: Ability to maintain a balanced intake and output will improve Outcome: Progressing   Problem: Health Behavior/Discharge Planning: Goal: Ability to manage health-related needs will improve Outcome: Progressing   Problem: Metabolic: Goal: Ability to maintain appropriate glucose levels will improve Outcome: Progressing   Problem: Nutritional: Goal: Maintenance of adequate nutrition will improve Outcome: Progressing Goal: Progress toward achieving an optimal weight will improve Outcome: Progressing   Problem: Skin Integrity: Goal: Risk for impaired skin integrity will decrease Outcome: Progressing   Problem: Tissue Perfusion: Goal: Adequacy of tissue perfusion will improve Outcome: Progressing   Problem: Education: Goal: Knowledge of General Education information will improve Description: Including pain rating scale, medication(s)/side effects and non-pharmacologic  comfort measures Outcome: Progressing   Problem: Health Behavior/Discharge Planning: Goal: Ability to manage health-related needs will improve Outcome: Progressing   Problem: Clinical Measurements: Goal: Ability to maintain clinical measurements within normal limits will improve Outcome: Progressing Goal: Will remain free from infection Outcome: Progressing Goal: Diagnostic test results will improve Outcome: Progressing Goal: Respiratory complications will improve Outcome: Progressing Goal: Cardiovascular complication will be avoided Outcome: Progressing   Problem: Activity: Goal: Risk for activity intolerance will decrease Outcome: Progressing   Problem: Nutrition: Goal: Adequate nutrition will be maintained Outcome: Progressing   Problem: Coping: Goal: Level of anxiety will decrease Outcome: Progressing   Problem: Elimination: Goal: Will not experience complications related to bowel motility Outcome: Progressing Goal: Will not experience complications related to urinary retention Outcome: Progressing   Problem: Pain Managment: Goal: General experience of comfort will improve and/or be controlled Outcome: Progressing   Problem: Safety: Goal: Ability to remain free from injury will improve Outcome: Progressing   Problem: Skin Integrity: Goal: Risk for impaired skin integrity will decrease Outcome: Progressing

## 2024-08-11 NOTE — Plan of Care (Signed)
  Problem: Coping: Goal: Level of anxiety will decrease Outcome: Progressing   Problem: Pain Managment: Goal: General experience of comfort will improve and/or be controlled Outcome: Progressing   Problem: Skin Integrity: Goal: Risk for impaired skin integrity will decrease Outcome: Not Progressing   Problem: Activity: Goal: Risk for activity intolerance will decrease Outcome: Not Progressing   Problem: Skin Integrity: Goal: Risk for impaired skin integrity will decrease Outcome: Not Progressing

## 2024-08-11 NOTE — Progress Notes (Signed)
  Progress Note   Patient: Earl [name] FMW:[id number] DOB: 06/26/1941 DOA: 03/04/2024     160 DOS: the patient was seen and examined on 08/11/2024        Brief hospital course: 83 yo M with transthyretin cardiac amyloidosis, CHF, HTN, HLD, PAF on amiodarone  and Eliquis , history of stroke, chronic pancytopenia admitted for sepsis from sacral decubitus ulcer.     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 Hypothyroidism -Continue levothyroxine   Atrial fibrillation, paroxysmal History of stroke - Continue amiodarone , Eliquis  - Continue Lipitor  Diabetes Glucose normal - Continue sliding scale correction insulin    Severe sepsis-resolved MGUS Hypertension-no longer on medication Aspiration pneumonia-resolved Transaminitis-check LFTs tomorrow         Subjective: No change.  No new nursing concerns     Physical Exam: BP 117/62 (BP Location: Left Arm)   Pulse 80   Temp 97.7 F (36.5 C)   Resp 18   Ht 5' 11 (1.803 m)   Wt 72.1 kg   SpO2 98%   BMI 22.18 kg/m   Adult male, lying in bed, responds to questions RRR, no murmurs, unchanged pitting in the elbows bilaterally No lower extremity edema Lung sounds very diminished, poor inspiratory effort unchanged, no rales or wheezes appreciated Abdomen soft, no tenderness palpation, no distention He has generalized weakness but symmetric strength in the upper extremities able to lift against gravity, both lower extremities unable to lift against gravity, normal muscle twitch is observed     Family Communication: Daughter by  phone    Disposition: Status is: Inpatient         Author: Lonni SHAUNNA Dalton, MD 08/11/2024 1:31 PM  For on call review [email or web address].

## 2024-08-12 LAB — COMPREHENSIVE METABOLIC PANEL WITH GFR
ALT: 23 U/L (ref 0–44)
AST: 41 U/L (ref 15–41)
Albumin: 2.4 g/dL — ABNORMAL LOW (ref 3.5–5.0)
Alkaline Phosphatase: 268 U/L — ABNORMAL HIGH (ref 38–126)
Anion gap: 8 (ref 5–15)
BUN: 39 mg/dL — ABNORMAL HIGH (ref 8–23)
CO2: 24 mmol/L (ref 22–32)
Calcium: 8.6 mg/dL — ABNORMAL LOW (ref 8.9–10.3)
Chloride: 111 mmol/L (ref 98–111)
Creatinine, Ser: 0.83 mg/dL (ref 0.61–1.24)
GFR, Estimated: >60 mL/min (ref >60)
Glucose, Bld: 122 mg/dL — ABNORMAL HIGH (ref 70–99)
Potassium: 4 mmol/L (ref 3.5–5.1)
Sodium: 143 mmol/L (ref 135–145)
Total Bilirubin: 2 mg/dL — ABNORMAL HIGH (ref 0.0–1.2)
Total Protein: 5.7 g/dL — ABNORMAL LOW (ref 6.5–8.1)

## 2024-08-12 LAB — GLUCOSE, CAPILLARY
Glucose-Capillary: 129 mg/dL — ABNORMAL HIGH (ref 70–99)
Glucose-Capillary: 129 mg/dL — ABNORMAL HIGH (ref 70–99)
Glucose-Capillary: 140 mg/dL — ABNORMAL HIGH (ref 70–99)
Glucose-Capillary: 81 mg/dL (ref 70–99)

## 2024-08-12 LAB — VITAMIN A: Vitamin A (Retinoic Acid): 6.5 ug/dL — ABNORMAL LOW (ref 22.0–69.5)

## 2024-08-12 LAB — CBC
HCT: 34.3 % — ABNORMAL LOW (ref 39.0–52.0)
Hemoglobin: 11.3 g/dL — ABNORMAL LOW (ref 13.0–17.0)
MCH: 29.5 pg (ref 26.0–34.0)
MCHC: 32.9 g/dL (ref 30.0–36.0)
MCV: 89.6 fL (ref 80.0–100.0)
Platelets: 257 K/uL (ref 150–400)
RBC: 3.83 MIL/uL — ABNORMAL LOW (ref 4.22–5.81)
RDW: 19.9 % — ABNORMAL HIGH (ref 11.5–15.5)
WBC: 6 K/uL (ref 4.0–10.5)
nRBC: 0 % (ref 0.0–0.2)

## 2024-08-12 NOTE — Progress Notes (Signed)
 This is a no charge note.  Earl [name] was seen and examined.  Discussed with nursing.  No clinical change, no nursing concerns.  Continue wound cares, nutritional efforts and physical therapy.

## 2024-08-12 NOTE — Plan of Care (Signed)
 Problem: Fluid Volume: Goal: Hemodynamic stability will improve 08/12/2024 1804 by Bradly Devere LABOR, RN Outcome: Progressing 08/12/2024 1804 by Bradly Devere LABOR, RN Outcome: Progressing   Problem: Clinical Measurements: Goal: Diagnostic test results will improve 08/12/2024 1804 by Bradly Devere LABOR, RN Outcome: Progressing 08/12/2024 1804 by Bradly Devere LABOR, RN Outcome: Progressing Goal: Signs and symptoms of infection will decrease 08/12/2024 1804 by Bradly Devere LABOR, RN Outcome: Progressing 08/12/2024 1804 by Bradly Devere LABOR, RN Outcome: Progressing   Problem: Education: Goal: Knowledge of risk factors and measures for prevention of condition will improve 08/12/2024 1804 by Bradly Devere LABOR, RN Outcome: Progressing 08/12/2024 1804 by Bradly Devere LABOR, RN Outcome: Progressing   Problem: Coping: Goal: Psychosocial and spiritual needs will be supported 08/12/2024 1804 by Bradly Devere LABOR, RN Outcome: Progressing 08/12/2024 1804 by Bradly Devere LABOR, RN Outcome: Progressing   Problem: Respiratory: Goal: Will maintain a patent airway 08/12/2024 1804 by Bradly Devere LABOR, RN Outcome: Progressing 08/12/2024 1804 by Bradly Devere LABOR, RN Outcome: Progressing Goal: Complications related to the disease process, condition or treatment will be avoided or minimized 08/12/2024 1804 by Bradly Devere LABOR, RN Outcome: Progressing 08/12/2024 1804 by Bradly Devere LABOR, RN Outcome: Progressing   Problem: Education: Goal: Ability to describe self-care measures that may prevent or decrease complications (Diabetes Survival Skills Education) will improve 08/12/2024 1804 by Bradly Devere LABOR, RN Outcome: Progressing 08/12/2024 1804 by Bradly Devere LABOR, RN Outcome: Progressing   Problem: Coping: Goal: Ability to adjust to condition or change in health will improve 08/12/2024 1804 by Bradly Devere LABOR, RN Outcome: Progressing 08/12/2024 1804 by Bradly Devere LABOR, RN Outcome: Progressing   Problem: Fluid Volume: Goal: Ability to maintain a balanced intake  and output will improve 08/12/2024 1804 by Bradly Devere LABOR, RN Outcome: Progressing 08/12/2024 1804 by Bradly Devere LABOR, RN Outcome: Progressing   Problem: Health Behavior/Discharge Planning: Goal: Ability to manage health-related needs will improve 08/12/2024 1804 by Bradly Devere LABOR, RN Outcome: Progressing 08/12/2024 1804 by Bradly Devere LABOR, RN Outcome: Progressing   Problem: Metabolic: Goal: Ability to maintain appropriate glucose levels will improve 08/12/2024 1804 by Bradly Devere LABOR, RN Outcome: Progressing 08/12/2024 1804 by Bradly Devere LABOR, RN Outcome: Progressing   Problem: Nutritional: Goal: Maintenance of adequate nutrition will improve 08/12/2024 1804 by Bradly Devere LABOR, RN Outcome: Progressing 08/12/2024 1804 by Bradly Devere LABOR, RN Outcome: Progressing Goal: Progress toward achieving an optimal weight will improve 08/12/2024 1804 by Bradly Devere LABOR, RN Outcome: Progressing 08/12/2024 1804 by Bradly Devere LABOR, RN Outcome: Progressing   Problem: Skin Integrity: Goal: Risk for impaired skin integrity will decrease 08/12/2024 1804 by Bradly Devere LABOR, RN Outcome: Progressing 08/12/2024 1804 by Bradly Devere LABOR, RN Outcome: Progressing   Problem: Tissue Perfusion: Goal: Adequacy of tissue perfusion will improve 08/12/2024 1804 by Bradly Devere LABOR, RN Outcome: Progressing 08/12/2024 1804 by Bradly Devere LABOR, RN Outcome: Progressing   Problem: Education: Goal: Knowledge of General Education information will improve Description: Including pain rating scale, medication(s)/side effects and non-pharmacologic comfort measures 08/12/2024 1804 by Bradly Devere LABOR, RN Outcome: Progressing 08/12/2024 1804 by Bradly Devere LABOR, RN Outcome: Progressing   Problem: Health Behavior/Discharge Planning: Goal: Ability to manage health-related needs will improve 08/12/2024 1804 by Bradly Devere LABOR, RN Outcome: Progressing 08/12/2024 1804 by Bradly Devere LABOR, RN Outcome: Progressing   Problem: Clinical Measurements: Goal: Ability to  maintain clinical measurements within normal limits will improve 08/12/2024 1804 by Bradly Devere LABOR, RN Outcome: Progressing 08/12/2024 1804 by Bradly Devere  A, RN Outcome: Progressing Goal: Will remain free from infection 08/12/2024 1804 by Bradly Devere LABOR, RN Outcome: Progressing 08/12/2024 1804 by Bradly Devere LABOR, RN Outcome: Progressing Goal: Diagnostic test results will improve 08/12/2024 1804 by Bradly Devere LABOR, RN Outcome: Progressing 08/12/2024 1804 by Bradly Devere LABOR, RN Outcome: Progressing Goal: Respiratory complications will improve 08/12/2024 1804 by Bradly Devere LABOR, RN Outcome: Progressing 08/12/2024 1804 by Bradly Devere LABOR, RN Outcome: Progressing Goal: Cardiovascular complication will be avoided 08/12/2024 1804 by Bradly Devere LABOR, RN Outcome: Progressing 08/12/2024 1804 by Bradly Devere LABOR, RN Outcome: Progressing   Problem: Activity: Goal: Risk for activity intolerance will decrease 08/12/2024 1804 by Bradly Devere LABOR, RN Outcome: Progressing 08/12/2024 1804 by Bradly Devere LABOR, RN Outcome: Progressing   Problem: Nutrition: Goal: Adequate nutrition will be maintained 08/12/2024 1804 by Bradly Devere LABOR, RN Outcome: Progressing 08/12/2024 1804 by Bradly Devere LABOR, RN Outcome: Progressing   Problem: Coping: Goal: Level of anxiety will decrease 08/12/2024 1804 by Bradly Devere LABOR, RN Outcome: Progressing 08/12/2024 1804 by Bradly Devere LABOR, RN Outcome: Progressing   Problem: Elimination: Goal: Will not experience complications related to bowel motility 08/12/2024 1804 by Bradly Devere LABOR, RN Outcome: Progressing 08/12/2024 1804 by Bradly Devere LABOR, RN Outcome: Progressing Goal: Will not experience complications related to urinary retention 08/12/2024 1804 by Bradly Devere LABOR, RN Outcome: Progressing 08/12/2024 1804 by Bradly Devere LABOR, RN Outcome: Progressing   Problem: Pain Managment: Goal: General experience of comfort will improve and/or be controlled 08/12/2024 1804 by Bradly Devere LABOR, RN Outcome:  Progressing 08/12/2024 1804 by Bradly Devere LABOR, RN Outcome: Progressing   Problem: Safety: Goal: Ability to remain free from injury will improve 08/12/2024 1804 by Bradly Devere LABOR, RN Outcome: Progressing 08/12/2024 1804 by Bradly Devere LABOR, RN Outcome: Progressing   Problem: Skin Integrity: Goal: Risk for impaired skin integrity will decrease 08/12/2024 1804 by Bradly Devere LABOR, RN Outcome: Progressing 08/12/2024 1804 by Bradly Devere LABOR, RN Outcome: Progressing

## 2024-08-12 NOTE — Consult Note (Addendum)
 WOC Nurse wound follow up Wound type: Unstageable pressure injury left shoulder Stage 4 pressure injury: left trochanter Unstageable Pressure Injury; right heel Stage 4 Pressure Injury; sacrum Stage 3 pressure injury to L buttocks- healed Measurement: Left shoulder: 0.7 cm x 1 cm Left trochanter: 2.5 cm x 2 cm x 2 cm with 6 cm undermining from 1 o'clock to 4 o'clock and 7 cm tunnel at 2 o'clock.  Right heel: 3 cm x 3 cm Sacrum: 6 cm x 3 cm x 0.5 cm  with undermining from 12 o'clock to 1 o'clock measuring 1.5 cm and from 6 o'clock to 8 o'clock measuring 1.5 cm Wound bed: Left shoulder: 50% pink, red, moist 50% yellow slough  Left trochanter: 70% pink, moist, 30% yellow slough Right heel:  90% black eschar, 10% yellow loose slough, lifting circumferentially Sacrum: 100% red, moist Drainage (amount, consistency, odor)  Left shoulder: serosanguinous Left trochanter: serosanguinous in cannister Right heel: purulent Sacrum: serosanguinous Periwound: intact Dressing procedure/placement/frequency: 1- Left shoulder Unstageable  Cleanse with NS, pat dry, Apply 1/4 thick layer of Santyl  to wound bed, top with saline moist gauze. Top with silicone foam.  Change daily. 2- Left trochanter: NPWT WOC will change 2x weekly 3 - Sacral/ L buttocks: Cleanse with Vashe ([id number]) Place Aquacel AG (lawson # [id number]) over wound bed, tuck into the undermining area using a cotton tipped applicator. Top with silicone foam. Change daily.  3- Right heel; Unstageable; Cleanse with NS, pat dry.  Apply  Aquacel Ag+ (silver  hydrofiber) Lawson # [id number], ensure tucked into the edges where eschar is lifting. Cover with silicone foam dressing. Change daily.  Off load bilateral heels with Prevalon boots at all times.   LALM in place for moisture management and pressure redistribution.  Left trochanter: Removed old NPWT dressing to trochanter  Cleansed wound with normal saline   Filled wound with   __1__ piece of black  foam  Sealed NPWT dressing at HG   Patient tolerated procedure well   WOC nurse will continue to provide NPWT dressing changed due to the complexity of the dressing change.    Plan of care discussed with daughter, Para Locker, vis telephone.   Supplies for NPWT include: small black VAC foam (lawson # [id number]) and VAC cannister (lawson [id number]).  WOC Nurse team will follow with you and see patient within 10 days for wound assessments.  Please notify WOC nurses of any acute changes in the wounds or any new areas of concern  Doyal Polite, RN, MSN, [facility] WOC Team [phone] (Available Mon-Fri 0700-1500)

## 2024-08-12 NOTE — Plan of Care (Signed)
  Problem: Fluid Volume: Goal: Hemodynamic stability will improve Outcome: Progressing   Problem: Clinical Measurements: Goal: Diagnostic test results will improve Outcome: Progressing Goal: Signs and symptoms of infection will decrease Outcome: Progressing   Problem: Education: Goal: Knowledge of risk factors and measures for prevention of condition will improve Outcome: Progressing   Problem: Coping: Goal: Psychosocial and spiritual needs will be supported Outcome: Progressing   Problem: Respiratory: Goal: Will maintain a patent airway Outcome: Progressing Goal: Complications related to the disease process, condition or treatment will be avoided or minimized Outcome: Progressing   Problem: Education: Goal: Ability to describe self-care measures that may prevent or decrease complications (Diabetes Survival Skills Education) will improve Outcome: Progressing   Problem: Coping: Goal: Ability to adjust to condition or change in health will improve Outcome: Progressing   Problem: Fluid Volume: Goal: Ability to maintain a balanced intake and output will improve Outcome: Progressing   Problem: Health Behavior/Discharge Planning: Goal: Ability to manage health-related needs will improve Outcome: Progressing   Problem: Metabolic: Goal: Ability to maintain appropriate glucose levels will improve Outcome: Progressing   Problem: Nutritional: Goal: Maintenance of adequate nutrition will improve Outcome: Progressing Goal: Progress toward achieving an optimal weight will improve Outcome: Progressing   Problem: Skin Integrity: Goal: Risk for impaired skin integrity will decrease Outcome: Progressing   Problem: Tissue Perfusion: Goal: Adequacy of tissue perfusion will improve Outcome: Progressing   Problem: Education: Goal: Knowledge of General Education information will improve Description: Including pain rating scale, medication(s)/side effects and non-pharmacologic  comfort measures Outcome: Progressing   Problem: Health Behavior/Discharge Planning: Goal: Ability to manage health-related needs will improve Outcome: Progressing   Problem: Clinical Measurements: Goal: Ability to maintain clinical measurements within normal limits will improve Outcome: Progressing Goal: Will remain free from infection Outcome: Progressing Goal: Diagnostic test results will improve Outcome: Progressing Goal: Respiratory complications will improve Outcome: Progressing Goal: Cardiovascular complication will be avoided Outcome: Progressing   Problem: Activity: Goal: Risk for activity intolerance will decrease Outcome: Progressing   Problem: Nutrition: Goal: Adequate nutrition will be maintained Outcome: Progressing   Problem: Coping: Goal: Level of anxiety will decrease Outcome: Progressing   Problem: Elimination: Goal: Will not experience complications related to bowel motility Outcome: Progressing Goal: Will not experience complications related to urinary retention Outcome: Progressing   Problem: Pain Managment: Goal: General experience of comfort will improve and/or be controlled Outcome: Progressing   Problem: Safety: Goal: Ability to remain free from injury will improve Outcome: Progressing   Problem: Skin Integrity: Goal: Risk for impaired skin integrity will decrease Outcome: Progressing

## 2024-08-12 NOTE — Plan of Care (Signed)
  Problem: Coping: Goal: Psychosocial and spiritual needs will be supported Outcome: Progressing   Problem: Respiratory: Goal: Will maintain a patent airway Outcome: Progressing   Problem: Metabolic: Goal: Ability to maintain appropriate glucose levels will improve Outcome: Progressing   Problem: Skin Integrity: Goal: Risk for impaired skin integrity will decrease Outcome: Not Progressing   Problem: Health Behavior/Discharge Planning: Goal: Ability to manage health-related needs will improve Outcome: Not Progressing   Problem: Activity: Goal: Risk for activity intolerance will decrease Outcome: Not Progressing   Problem: Safety: Goal: Ability to remain free from injury will improve Outcome: Not Progressing   Problem: Skin Integrity: Goal: Risk for impaired skin integrity will decrease Outcome: Not Progressing

## 2024-08-13 DIAGNOSIS — A419 Sepsis, unspecified organism: Secondary | ICD-10-CM | POA: Diagnosis not present

## 2024-08-13 DIAGNOSIS — R6520 Severe sepsis without septic shock: Secondary | ICD-10-CM | POA: Diagnosis not present

## 2024-08-13 LAB — GLUCOSE, CAPILLARY
Glucose-Capillary: 113 mg/dL — ABNORMAL HIGH (ref 70–99)
Glucose-Capillary: 161 mg/dL — ABNORMAL HIGH (ref 70–99)
Glucose-Capillary: 91 mg/dL (ref 70–99)

## 2024-08-13 NOTE — Progress Notes (Signed)
  Progress Note   Patient: Earl [name] FMW:[id number] DOB: October 17, 1941 DOA: 03/04/2024     162 DOS: the patient was seen and examined on 08/13/2024        Brief hospital course: 83 y.o. 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      Assessment and Plan: Chronic systolic congestive heart failure Cardiac amyloidosis - Continue tafamidis  - Continue quarterly Amvuttra  when able to provide from home   Sacral decubitus ulcer, stage IV Severe protein calorie malnutrition Polyneuropathy, bedbound - Continue air mattress - Continue wound vac and regular wound vac changes - Continue nutritional supplements, Prosource, Juven  Hypothyroidism - Continue levothyroxine   Paroxysmal atrial fibrillation Cerebrovascular disease, history of stroke NSR last 24 hours. - Continue amiodarone , ELiquis  - Continue atorvastatin   Diabetes Glucoses controlled on sliding scale corrections - Continue SSI  Transaminitis Resolved   Severe sepsis-resolved MGUS Hypertension-no longer on medication Aspiration pneumonia-resolved         Subjective: No change, no new nursing concerns.     Physical Exam: BP 109/68 (BP Location: Left Arm)   Pulse 74   Temp 98 F (36.7 C) (Oral)   Resp 18   Ht 5' 11 (1.803 m)   Wt 72.1 kg   SpO2 99%   BMI 22.18 kg/m   Adult male, lying in bed, appears chronically debilitated Heart rate normal, no murmurs, arm pitting unchanged from most of the week. Lung sounds diminished, poor inspiratory reserve, no rales or wheezing appreciated, limited chest excursion Abdomen soft, no tenderness palpation, no distention, no guarding Attention normal, oriented to self, place, situation, some psychomotor slowing is noted, voice hypophonic and congested, unchanged through the week   Data Reviewed: No new labs  today    Family Communication:     Disposition: Status is: Inpatient         Author: Lonni SHAUNNA Dalton, MD 08/13/2024 2:06 PM  For on call review [email or web address].

## 2024-08-13 NOTE — Plan of Care (Signed)
  Problem: Fluid Volume: Goal: Hemodynamic stability will improve Outcome: Progressing   Problem: Clinical Measurements: Goal: Diagnostic test results will improve Outcome: Progressing Goal: Signs and symptoms of infection will decrease Outcome: Progressing   Problem: Education: Goal: Knowledge of risk factors and measures for prevention of condition will improve Outcome: Progressing   Problem: Coping: Goal: Psychosocial and spiritual needs will be supported Outcome: Progressing   Problem: Respiratory: Goal: Will maintain a patent airway Outcome: Progressing Goal: Complications related to the disease process, condition or treatment will be avoided or minimized Outcome: Progressing   Problem: Education: Goal: Ability to describe self-care measures that may prevent or decrease complications (Diabetes Survival Skills Education) will improve Outcome: Progressing   Problem: Coping: Goal: Ability to adjust to condition or change in health will improve Outcome: Progressing   Problem: Fluid Volume: Goal: Ability to maintain a balanced intake and output will improve Outcome: Progressing   Problem: Health Behavior/Discharge Planning: Goal: Ability to manage health-related needs will improve Outcome: Progressing   Problem: Metabolic: Goal: Ability to maintain appropriate glucose levels will improve Outcome: Progressing   Problem: Nutritional: Goal: Maintenance of adequate nutrition will improve Outcome: Progressing Goal: Progress toward achieving an optimal weight will improve Outcome: Progressing   Problem: Skin Integrity: Goal: Risk for impaired skin integrity will decrease Outcome: Progressing   Problem: Tissue Perfusion: Goal: Adequacy of tissue perfusion will improve Outcome: Progressing   Problem: Education: Goal: Knowledge of General Education information will improve Description: Including pain rating scale, medication(s)/side effects and non-pharmacologic  comfort measures Outcome: Progressing   Problem: Health Behavior/Discharge Planning: Goal: Ability to manage health-related needs will improve Outcome: Progressing   Problem: Clinical Measurements: Goal: Ability to maintain clinical measurements within normal limits will improve Outcome: Progressing Goal: Will remain free from infection Outcome: Progressing Goal: Diagnostic test results will improve Outcome: Progressing Goal: Respiratory complications will improve Outcome: Progressing Goal: Cardiovascular complication will be avoided Outcome: Progressing   Problem: Activity: Goal: Risk for activity intolerance will decrease Outcome: Progressing   Problem: Nutrition: Goal: Adequate nutrition will be maintained Outcome: Progressing   Problem: Coping: Goal: Level of anxiety will decrease Outcome: Progressing   Problem: Elimination: Goal: Will not experience complications related to bowel motility Outcome: Progressing Goal: Will not experience complications related to urinary retention Outcome: Progressing   Problem: Pain Managment: Goal: General experience of comfort will improve and/or be controlled Outcome: Progressing   Problem: Safety: Goal: Ability to remain free from injury will improve Outcome: Progressing   Problem: Skin Integrity: Goal: Risk for impaired skin integrity will decrease Outcome: Progressing

## 2024-08-13 NOTE — Progress Notes (Signed)
 Nutrition Follow-up  DOCUMENTATION CODES:   Severe malnutrition in context of chronic illness  INTERVENTION:  -Continue puree diet with thin liquids per pt's daughter's request, allow dauhgter to bring in preferred foods to feed in the evenings when she visits -Continue 1 packet Juven BID, each packet provides 95 calories, 2.5 grams of protein (collagen), and 9.8 grams of carbohydrate (3 grams sugar); also contains 7 grams of L-arginine and L-glutamine, 300 mg vitamin C , 15 mg vitamin E, 1.2 mcg vitamin B-12, 9.5 mg zinc , 200 mg calcium , and 1.5 g  Calcium  Beta-hydroxy-Beta-methylbutyrate to support wound healing  -Continue mixing 30 ml ProSource Plus BID with Juven, each supplement provides 100 kcals and 15 grams protein.  -Continue Ensure Plus High Protein po BID, each supplement provides 350 kcal and 20 grams of protein  -Continue weekly weights -Continue 10,000 units vitamin A  daily due to chronic deficiency (8.6 on 07/08/24) Re-tested  on 9/15 at 6.5 -Continue MVI with minerals daily -Continue feeding assistance with meals     NUTRITION DIAGNOSIS:   Severe Malnutrition related to chronic illness as evidenced by energy intake < or equal to 75% for > or equal to 1 month, severe muscle depletion.  Ongoing  GOAL:   Patient will meet greater than or equal to 90% of their needs  Met  MONITOR:   PO intake, Supplement acceptance, Weight trends, Skin  REASON FOR ASSESSMENT:   Consult Assessment of nutrition requirement/status  ASSESSMENT:   83 year old male who presented to the ED on 03/04/24 from [facility] due to sacral pain. PMH of T2DM, HTN, HLD, CHF, atrial fibrillation, stroke with right-sided weakness, GERD, BPH, hypothyroidism, MGUS, transthyretin associated amyloid cardiomyopathy, chronic pancytopenia, recent hospital admission for COVID-19. Pt admitted with sepsis secondary to sacral pressure injury infection.  Pt seen in room. C/o pain and requesting nurse, RN  notified. Patient currently on DYS 1 diet with daughter bringing in food in the evening when she visits due to unfeasibility of hybrid diet (allowing for puree meals sent for breakfast/lunch and regular diet sent for dinner) while inpatient. Dressings changed by WOC yesterday. Average 57% PO intake x 7 meals documented in flowsheet. Disposition ongoing.   Labs Glu 122 BUN 39  Calcium  8.6 Alk Phos 268  Vitamin A  6.5   Medications  (feeding supplement) PROSource Plus  30 mL Oral BID BM   amiodarone   200 mg Oral Daily   apixaban   5 mg Oral BID   atorvastatin   10 mg Oral Daily   collagenase    Topical Daily   diclofenac  Sodium  4 g Topical QID   feeding supplement  237 mL Oral BID BM   ferrous sulfate   325 mg Oral QODAY   fluticasone   1 spray Each Nare Daily   furosemide   20 mg Oral Daily   insulin  aspart  0-9 Units Subcutaneous TID WC   levothyroxine   50 mcg Oral Q0600   lidocaine   1 patch Transdermal Q24H   liver oil-zinc  oxide   Topical BID   loratadine   10 mg Oral Daily   multivitamin with minerals  1 tablet Oral Daily   nutrition supplement (JUVEN)  1 packet Oral Daily   nutrition supplement (JUVEN)  1 packet Oral q1600   mouth rinse  15 mL Mouth Rinse 4 times per day   pantoprazole   40 mg Oral Daily   polyethylene glycol  17 g Oral Daily   sertraline   50 mg Oral Daily   silver  nitrate applicators  2 Stick Topical Once  sodium chloride  flush  10-40 mL Intracatheter Q12H   sodium phosphate   1 enema Rectal QODAY   sorbitol   30 mL Oral Daily   Tafamidis   1 capsule Oral Daily   vitamin A   10,000 Units Oral Daily   vutrisiran  sodium  25 mg Subcutaneous Q90 days    Diet Order:   Diet Order             DIET - DYS 1 Room service appropriate? Yes; Fluid consistency: Thin  Diet effective now                   EDUCATION NEEDS:   No education needs have been identified at this time  Skin:  Skin Assessment: Skin Integrity Issues: Skin Integrity Issues:: Unstageable,  Stage IV, Wound VAC DTI: - Stage II: - Stage IV: lt trochanter (wound vac placed 07/16/24) Unstageable: lt shoulder, rt heel Wound Vac: lt trochanter Other: -  Last BM:  9/16  Height:   Ht Readings from Last 1 Encounters:  03/04/24 5' 11 (1.803 m)    Weight:   Wt Readings from Last 1 Encounters:  08/08/24 72.1 kg    Ideal Body Weight:  78.2 kg  BMI:  Body mass index is 22.18 kg/m.  Estimated Nutritional Needs:   Kcal:  1900-2100  Protein:  100-115g  Fluid:  >/=1.9L  Severin Bou, MS, RD, LDN Clinical Dietitian  Please see [facility] for contact information.

## 2024-08-14 DIAGNOSIS — R6520 Severe sepsis without septic shock: Secondary | ICD-10-CM | POA: Diagnosis not present

## 2024-08-14 DIAGNOSIS — A419 Sepsis, unspecified organism: Secondary | ICD-10-CM | POA: Diagnosis not present

## 2024-08-14 LAB — GLUCOSE, CAPILLARY
Glucose-Capillary: 146 mg/dL — ABNORMAL HIGH (ref 70–99)
Glucose-Capillary: 150 mg/dL — ABNORMAL HIGH (ref 70–99)
Glucose-Capillary: 168 mg/dL — ABNORMAL HIGH (ref 70–99)
Glucose-Capillary: 115 mg/dL — ABNORMAL HIGH (ref 70–99)
Glucose-Capillary: 177 mg/dL — ABNORMAL HIGH (ref 70–99)

## 2024-08-14 NOTE — Plan of Care (Signed)
  Problem: Fluid Volume: Goal: Hemodynamic stability will improve Outcome: Not Progressing   Problem: Clinical Measurements: Goal: Diagnostic test results will improve Outcome: Not Progressing Goal: Signs and symptoms of infection will decrease Outcome: Not Progressing   Problem: Education: Goal: Knowledge of risk factors and measures for prevention of condition will improve Outcome: Not Progressing   Problem: Coping: Goal: Psychosocial and spiritual needs will be supported Outcome: Not Progressing   Problem: Respiratory: Goal: Will maintain a patent airway Outcome: Not Progressing Goal: Complications related to the disease process, condition or treatment will be avoided or minimized Outcome: Not Progressing   Problem: Education: Goal: Ability to describe self-care measures that may prevent or decrease complications (Diabetes Survival Skills Education) will improve Outcome: Not Progressing   Problem: Coping: Goal: Ability to adjust to condition or change in health will improve Outcome: Not Progressing   Problem: Fluid Volume: Goal: Ability to maintain a balanced intake and output will improve Outcome: Not Progressing   Problem: Health Behavior/Discharge Planning: Goal: Ability to manage health-related needs will improve Outcome: Not Progressing   Problem: Metabolic: Goal: Ability to maintain appropriate glucose levels will improve Outcome: Not Progressing   Problem: Nutritional: Goal: Maintenance of adequate nutrition will improve Outcome: Not Progressing Goal: Progress toward achieving an optimal weight will improve Outcome: Not Progressing   Problem: Skin Integrity: Goal: Risk for impaired skin integrity will decrease Outcome: Not Progressing   Problem: Tissue Perfusion: Goal: Adequacy of tissue perfusion will improve Outcome: Not Progressing   Problem: Education: Goal: Knowledge of General Education information will improve Description: Including pain  rating scale, medication(s)/side effects and non-pharmacologic comfort measures Outcome: Not Progressing   Problem: Health Behavior/Discharge Planning: Goal: Ability to manage health-related needs will improve Outcome: Not Progressing   Problem: Clinical Measurements: Goal: Ability to maintain clinical measurements within normal limits will improve Outcome: Not Progressing Goal: Will remain free from infection Outcome: Not Progressing Goal: Diagnostic test results will improve Outcome: Not Progressing Goal: Respiratory complications will improve Outcome: Not Progressing Goal: Cardiovascular complication will be avoided Outcome: Not Progressing   Problem: Activity: Goal: Risk for activity intolerance will decrease Outcome: Not Progressing   Problem: Nutrition: Goal: Adequate nutrition will be maintained Outcome: Not Progressing   Problem: Coping: Goal: Level of anxiety will decrease Outcome: Not Progressing   Problem: Elimination: Goal: Will not experience complications related to bowel motility Outcome: Not Progressing Goal: Will not experience complications related to urinary retention Outcome: Not Progressing   Problem: Pain Managment: Goal: General experience of comfort will improve and/or be controlled Outcome: Not Progressing   Problem: Safety: Goal: Ability to remain free from injury will improve Outcome: Not Progressing   Problem: Skin Integrity: Goal: Risk for impaired skin integrity will decrease Outcome: Not Progressing

## 2024-08-14 NOTE — Plan of Care (Signed)
  Problem: Fluid Volume: Goal: Hemodynamic stability will improve Outcome: Not Progressing   Problem: Coping: Goal: Ability to adjust to condition or change in health will improve Outcome: Not Progressing   Problem: Health Behavior/Discharge Planning: Goal: Ability to manage health-related needs will improve Outcome: Not Progressing   Problem: Metabolic: Goal: Ability to maintain appropriate glucose levels will improve Outcome: Not Progressing   Problem: Skin Integrity: Goal: Risk for impaired skin integrity will decrease Outcome: Not Progressing   Problem: Clinical Measurements: Goal: Diagnostic test results will improve Outcome: Progressing Goal: Signs and symptoms of infection will decrease Outcome: Progressing   Problem: Education: Goal: Knowledge of risk factors and measures for prevention of condition will improve Outcome: Progressing   Problem: Coping: Goal: Psychosocial and spiritual needs will be supported Outcome: Progressing   Problem: Respiratory: Goal: Will maintain a patent airway Outcome: Progressing Goal: Complications related to the disease process, condition or treatment will be avoided or minimized Outcome: Progressing   Problem: Education: Goal: Ability to describe self-care measures that may prevent or decrease complications (Diabetes Survival Skills Education) will improve Outcome: Progressing   Problem: Fluid Volume: Goal: Ability to maintain a balanced intake and output will improve Outcome: Progressing   Problem: Nutritional: Goal: Maintenance of adequate nutrition will improve Outcome: Progressing Goal: Progress toward achieving an optimal weight will improve Outcome: Progressing   Problem: Tissue Perfusion: Goal: Adequacy of tissue perfusion will improve Outcome: Progressing   Problem: Education: Goal: Knowledge of General Education information will improve Description: Including pain rating scale, medication(s)/side effects and  non-pharmacologic comfort measures Outcome: Progressing   Problem: Health Behavior/Discharge Planning: Goal: Ability to manage health-related needs will improve Outcome: Progressing   Problem: Clinical Measurements: Goal: Ability to maintain clinical measurements within normal limits will improve Outcome: Progressing Goal: Will remain free from infection Outcome: Progressing Goal: Diagnostic test results will improve Outcome: Progressing Goal: Respiratory complications will improve Outcome: Progressing Goal: Cardiovascular complication will be avoided Outcome: Progressing   Problem: Activity: Goal: Risk for activity intolerance will decrease Outcome: Progressing   Problem: Nutrition: Goal: Adequate nutrition will be maintained Outcome: Progressing   Problem: Coping: Goal: Level of anxiety will decrease Outcome: Progressing   Problem: Elimination: Goal: Will not experience complications related to bowel motility Outcome: Progressing Goal: Will not experience complications related to urinary retention Outcome: Progressing   Problem: Pain Managment: Goal: General experience of comfort will improve and/or be controlled Outcome: Progressing   Problem: Safety: Goal: Ability to remain free from injury will improve Outcome: Progressing   Problem: Skin Integrity: Goal: Risk for impaired skin integrity will decrease Outcome: Progressing

## 2024-08-14 NOTE — Progress Notes (Signed)
  Progress Note   Patient: Earl [name] FMW:[id number] DOB: September 10, 1941 DOA: 03/04/2024     163 DOS: the patient was seen and examined on 08/14/2024        Brief hospital course: 83 y.o. 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   Assessment and Plan: Chronic systolic congestive heart failure Cardiac amyloidosis - Continue tafamidis  - Continue quarterly Amvuttra  when able  Sacral decubitus ulcer, stage IV Severe protein calorie malnutrition Polyneuropathy, bedbound - Continue air mattress - Continue wound vac and regular wound vac changes--see routine WOC nurse input - Continue nutritional supplements, Prosource, Juven  Hypothyroidism - Continue levothyroxine   Paroxysmal atrial fibrillation Cerebrovascular disease, history of stroke NSR last 24 hours. - Continue amiodarone , ELiquis  - Continue atorvastatin   Diabetes Glucoses controlled on sliding scale corrections - Continue SSI--might be able to d/c coverage as sugars overall below 180  Transaminitis Resolved  Severe sepsis-resolved MGUS Hypertension-no longer on medication  Aspiration pneumonia-resolved--remains high risk for recurrence       Subjective: No change, no new nursing concerns.     Physical Exam: BP 111/66 (BP Location: Left Arm)   Pulse 73   Temp 97.7 F (36.5 C) (Oral)   Resp 14   Ht 5' 11 (1.803 m)   Wt 72.1 kg   SpO2 100%   BMI 22.18 kg/m   Adult male, lying in bed, appears chronically debilitated Heart rate normal, no murmurs, arm pitting unchanged from most of the week. Lung sounds diminished, poor inspiratory reserve, no rales or wheezing appreciated, limited chest excursion Abdomen soft, no tenderness palpation, no distention, no guarding Attention normal, oriented to self, place, situation, some psychomotor slowing is noted,  voice hypophonic and congested, unchanged through the week   Data Reviewed: No new labs today    Family Communication:     Disposition: Status is: Inpatient         Author: Colen Grimes, MD 08/14/2024 5:32 PM  For on call review [email or web address].

## 2024-08-14 NOTE — Progress Notes (Signed)
 Patient resting in bed intermittently sucking on a cough drop. After spitting out cough drop patients daughter noticed he was still sucking on something.... object retrieved and it was his tooth. (LEFT LOWER MOLAR) Patient had no complaints of pain. Daughter would like a dentist to come to evaluate at bedside. Daughter placed tooth in a ziploc bag and left it on bedside table for MD to see. Charge nurse notified.

## 2024-08-15 ENCOUNTER — Other Ambulatory Visit: Payer: Self-pay

## 2024-08-15 ENCOUNTER — Encounter: Payer: Self-pay | Admitting: Cardiology

## 2024-08-15 DIAGNOSIS — A419 Sepsis, unspecified organism: Secondary | ICD-10-CM | POA: Diagnosis not present

## 2024-08-15 DIAGNOSIS — R6520 Severe sepsis without septic shock: Secondary | ICD-10-CM | POA: Diagnosis not present

## 2024-08-15 LAB — GLUCOSE, CAPILLARY
Glucose-Capillary: 161 mg/dL — ABNORMAL HIGH (ref 70–99)
Glucose-Capillary: 155 mg/dL — ABNORMAL HIGH (ref 70–99)
Glucose-Capillary: 160 mg/dL — ABNORMAL HIGH (ref 70–99)

## 2024-08-15 NOTE — Plan of Care (Signed)
  Problem: Health Behavior/Discharge Planning: Goal: Ability to manage health-related needs will improve Outcome: Not Progressing   Problem: Skin Integrity: Goal: Risk for impaired skin integrity will decrease Outcome: Not Progressing   Problem: Coping: Goal: Level of anxiety will decrease Outcome: Not Progressing   Problem: Elimination: Goal: Will not experience complications related to bowel motility Outcome: Progressing   Problem: Elimination: Goal: Will not experience complications related to urinary retention Outcome: Progressing

## 2024-08-15 NOTE — Progress Notes (Addendum)
  Progress Note   Patient: Earl [name] FMW:[id number] DOB: 1941/07/06 DOA: 03/04/2024     164 DOS: the patient was seen and examined on 08/15/2024        Brief hospital course: 83 y.o. 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   Assessment and Plan: Chronic systolic congestive heart failure Cardiac amyloidosis - Continue tafamidis  - Continue quarterly Amvuttra  when able  Sacral decubitus ulcer, stage IV Severe protein calorie malnutrition Polyneuropathy, bedbound - Continue air mattress - Continue wound vac and regular wound vac changes--see routine WOC nurse input  Will assess wounds personally as able - Continue nutritional supplements, Prosource, Juven  Hypothyroidism - Continue levothyroxine   Paroxysmal atrial fibrillation Cerebrovascular disease, history of stroke NSR last 24 hours. - Continue amiodarone , ELiquis  - Continue atorvastatin  -Get periodic LFT TSH  Diabetes Glucoses controlled on sliding scale corrections - holding SSI for comfort as patient's sugars are less than 180 -Follow intake closely as he tends to hypoglycemia  Transaminitis Resolved  Severe sepsis-resolved MGUS Hypertension-no longer on medication  Aspiration pneumonia-resolved--remains high risk for recurrence       Subjective:  Having a better day mild cough no fever no chills   Physical Exam: BP 115/73 (BP Location: Left Arm)   Pulse 76   Temp 97.6 F (36.4 C) (Oral)   Resp (!) 22   Ht 5' 11 (1.803 m)   Wt 72.1 kg   SpO2 98%   BMI 22.18 kg/m    Low voice no distress no fever no chills CTAB no added sound Abdomen soft no rebound some mild anasarca lower abdomen upper legs cannot appreciate HSM Power 5/5 but quite weak and debilitated S1-S2 no murmur Quite emaciated  Data Reviewed: No new labs today    Family  Communication:   Spoke with Para on 9/24  Disposition: Status is: Inpatient         Author: Colen Grimes, MD 08/15/2024 3:30 PM  For on call review [email or web address].

## 2024-08-15 NOTE — Progress Notes (Signed)
 Mobility Specialist: Progress Note   08/15/24 1305  Mobility  Activity  (HEP)  Range of Motion/Exercises Active Assistive;Active;All extremities  Activity Response Tolerated well  Mobility Referral Yes  Mobility visit 1 Mobility  Mobility Specialist Start Time (ACUTE ONLY) 1120  Mobility Specialist Stop Time (ACUTE ONLY) 1153  Mobility Specialist Time Calculation (min) (ACUTE ONLY) 33 min    Pt received in bed, agreeable to mobility session. Performed UE and LE exercises with level 3 therabands, tolerated well. Left in bed with all needs met, call bell in reach.    UE (with lvl 3 theraband): chest press, crossbody punches, lying rows (10x each) LE: leg lifts (15x each leg), bicycles (2 rounds, 40s each)  Earl [name] Mobility Specialist Please contact via SecureChat or Rehab office at [phone]

## 2024-08-16 ENCOUNTER — Other Ambulatory Visit: Payer: Self-pay | Admitting: Pharmacy Technician

## 2024-08-16 ENCOUNTER — Inpatient Hospital Stay (HOSPITAL_COMMUNITY)

## 2024-08-16 ENCOUNTER — Other Ambulatory Visit: Payer: Self-pay

## 2024-08-16 DIAGNOSIS — R6520 Severe sepsis without septic shock: Secondary | ICD-10-CM | POA: Diagnosis not present

## 2024-08-16 DIAGNOSIS — A419 Sepsis, unspecified organism: Secondary | ICD-10-CM | POA: Diagnosis not present

## 2024-08-16 LAB — GLUCOSE, CAPILLARY
Glucose-Capillary: 156 mg/dL — ABNORMAL HIGH (ref 70–99)
Glucose-Capillary: 137 mg/dL — ABNORMAL HIGH (ref 70–99)
Glucose-Capillary: 213 mg/dL — ABNORMAL HIGH (ref 70–99)
Glucose-Capillary: 239 mg/dL — ABNORMAL HIGH (ref 70–99)

## 2024-08-16 LAB — COMPREHENSIVE METABOLIC PANEL WITH GFR
ALT: 24 U/L (ref 0–44)
AST: 39 U/L (ref 15–41)
Albumin: 2.5 g/dL — ABNORMAL LOW (ref 3.5–5.0)
Alkaline Phosphatase: 292 U/L — ABNORMAL HIGH (ref 38–126)
Anion gap: 10 (ref 5–15)
BUN: 31 mg/dL — ABNORMAL HIGH (ref 8–23)
CO2: 22 mmol/L (ref 22–32)
Calcium: 8.5 mg/dL — ABNORMAL LOW (ref 8.9–10.3)
Chloride: 110 mmol/L (ref 98–111)
Creatinine, Ser: 0.83 mg/dL (ref 0.61–1.24)
GFR, Estimated: >60 mL/min (ref >60)
Glucose, Bld: 143 mg/dL — ABNORMAL HIGH (ref 70–99)
Potassium: 4 mmol/L (ref 3.5–5.1)
Sodium: 142 mmol/L (ref 135–145)
Total Bilirubin: 2.3 mg/dL — ABNORMAL HIGH (ref 0.0–1.2)
Total Protein: 6.1 g/dL — ABNORMAL LOW (ref 6.5–8.1)

## 2024-08-16 LAB — CBC WITH DIFFERENTIAL/PLATELET
Abs Immature Granulocytes: 0.04 K/uL (ref 0.00–0.07)
Basophils Absolute: 0.1 K/uL (ref 0.0–0.1)
Basophils Relative: 1 %
Eosinophils Absolute: 0.1 K/uL (ref 0.0–0.5)
Eosinophils Relative: 2 %
HCT: 34.1 % — ABNORMAL LOW (ref 39.0–52.0)
Hemoglobin: 11.4 g/dL — ABNORMAL LOW (ref 13.0–17.0)
Immature Granulocytes: 1 %
Lymphocytes Relative: 18 %
Lymphs Abs: 1 K/uL (ref 0.7–4.0)
MCH: 30.1 pg (ref 26.0–34.0)
MCHC: 33.4 g/dL (ref 30.0–36.0)
MCV: 90 fL (ref 80.0–100.0)
Monocytes Absolute: 0.8 K/uL (ref 0.1–1.0)
Monocytes Relative: 13 %
Neutro Abs: 3.8 K/uL (ref 1.7–7.7)
Neutrophils Relative %: 65 %
Platelets: 271 K/uL (ref 150–400)
RBC: 3.79 MIL/uL — ABNORMAL LOW (ref 4.22–5.81)
RDW: 19.5 % — ABNORMAL HIGH (ref 11.5–15.5)
WBC: 5.9 K/uL (ref 4.0–10.5)
nRBC: 0 % (ref 0.0–0.2)

## 2024-08-16 LAB — PROCALCITONIN: Procalcitonin: 0.91 ng/mL

## 2024-08-16 MED ORDER — FUROSEMIDE 10 MG/ML IJ SOLN
40.0000 mg | Freq: Once | INTRAMUSCULAR | Status: AC
  Administered 2024-08-16: 40 mg via INTRAVENOUS
  Filled 2024-08-16: qty 4

## 2024-08-16 MED ORDER — IPRATROPIUM-ALBUTEROL 0.5-2.5 (3) MG/3ML IN SOLN
3.0000 mL | RESPIRATORY_TRACT | Status: DC
  Administered 2024-08-16: 3 mL via RESPIRATORY_TRACT
  Filled 2024-08-16: qty 3

## 2024-08-16 MED ORDER — IPRATROPIUM-ALBUTEROL 0.5-2.5 (3) MG/3ML IN SOLN
3.0000 mL | Freq: Two times a day (BID) | RESPIRATORY_TRACT | Status: DC
  Administered 2024-08-16 – 2024-08-18 (×4): 3 mL via RESPIRATORY_TRACT
  Filled 2024-08-16 (×4): qty 3

## 2024-08-16 NOTE — Plan of Care (Signed)
  Problem: Clinical Measurements: Goal: Diagnostic test results will improve Outcome: Progressing Goal: Signs and symptoms of infection will decrease Outcome: Progressing   Problem: Education: Goal: Knowledge of risk factors and measures for prevention of condition will improve Outcome: Progressing   Problem: Coping: Goal: Psychosocial and spiritual needs will be supported Outcome: Progressing   Problem: Respiratory: Goal: Complications related to the disease process, condition or treatment will be avoided or minimized Outcome: Progressing   Problem: Education: Goal: Ability to describe self-care measures that may prevent or decrease complications (Diabetes Survival Skills Education) will improve Outcome: Progressing   Problem: Coping: Goal: Ability to adjust to condition or change in health will improve Outcome: Progressing   Problem: Health Behavior/Discharge Planning: Goal: Ability to manage health-related needs will improve Outcome: Progressing   Problem: Metabolic: Goal: Ability to maintain appropriate glucose levels will improve Outcome: Progressing   Problem: Nutritional: Goal: Progress toward achieving an optimal weight will improve Outcome: Progressing   Problem: Tissue Perfusion: Goal: Adequacy of tissue perfusion will improve Outcome: Progressing   Problem: Education: Goal: Knowledge of General Education information will improve Description: Including pain rating scale, medication(s)/side effects and non-pharmacologic comfort measures Outcome: Progressing   Problem: Health Behavior/Discharge Planning: Goal: Ability to manage health-related needs will improve Outcome: Progressing   Problem: Clinical Measurements: Goal: Ability to maintain clinical measurements within normal limits will improve Outcome: Progressing Goal: Will remain free from infection Outcome: Progressing Goal: Diagnostic test results will improve Outcome: Progressing Goal:  Respiratory complications will improve Outcome: Progressing Goal: Cardiovascular complication will be avoided Outcome: Progressing   Problem: Activity: Goal: Risk for activity intolerance will decrease Outcome: Progressing   Problem: Nutrition: Goal: Adequate nutrition will be maintained Outcome: Progressing   Problem: Coping: Goal: Level of anxiety will decrease Outcome: Progressing   Problem: Elimination: Goal: Will not experience complications related to bowel motility Outcome: Progressing Goal: Will not experience complications related to urinary retention Outcome: Progressing   Problem: Pain Managment: Goal: General experience of comfort will improve and/or be controlled Outcome: Progressing   Problem: Safety: Goal: Ability to remain free from injury will improve Outcome: Progressing   Problem: Skin Integrity: Goal: Risk for impaired skin integrity will decrease Outcome: Progressing

## 2024-08-16 NOTE — Progress Notes (Signed)
 Specialty Pharmacy Refill Coordination Note  Earl [name] is a 83 y.o. male contacted today regarding refills of specialty medication(s) Tafamidis    Patient requested Delivery   Delivery date: 08/21/24   Verified address: [address]   [place] [address]   Medication will be filled on 08/20/24.  Spoke with patient's daughter Para.

## 2024-08-16 NOTE — Progress Notes (Signed)
 Patient noted to have fresh bleeding from his sacral wound. pressure applied to the area, hemostasis achieved. Vaseline dressing applied to the wound to help stop further bleeding. Patient to be monitored for further bleeding.

## 2024-08-16 NOTE — Progress Notes (Signed)
 Received a message from Dr. Clayton of palliative care that the patient's daughter had called the palliative medicine lining expressing concern over bleeding from his sacral wound. Concern acknowledged, provider informed of the interventions that had been taken. Wound assessed and no active bleeding noted or soiling of the dressing.

## 2024-08-16 NOTE — Consult Note (Signed)
 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  Wound bed: 70% pink, moist, 30% yellow slough Drainage (amount, consistency, odor) serosanguinous in cannister Periwound: intact Dressing procedure/placement/frequency:  Removed old NPWT dressing   Filled wound with  __1__ piece of black foam  Sealed NPWT dressing at HG  Patient tolerated procedure well  WOC nurse will continue to provide NPWT dressing changed due to the complexity of the dressing change.     Thank you,  Doyal Polite, MSN, RN, [facility] WOC Team [phone] (Available Mon-Fri 0700-1500)

## 2024-08-16 NOTE — Progress Notes (Signed)
  Progress Note  Patient: Earl [name] FMW:[id number] DOB: 03-19-1941 DOA: 03/04/2024     165 DOS: the patient was seen and examined on 08/16/2024     Brief hospital course: 83 y.o. 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 Lasix  giving IV Lasix  40 and will reassess this afternoon for further  Sacral decubitus ulcer, stage IV Severe protein calorie malnutrition Polyneuropathy, bedbound - Continue air mattress - Continue wound vac and regular wound vac changes--see routine WOC nurse input  See wound picture below - Continue nutritional supplements, Prosource, Juven  Hypothyroidism - Continue levothyroxine   Paroxysmal atrial fibrillation Cerebrovascular disease, history of stroke NSR last 24 hours. - Continue amiodarone , ELiquis  - Continue atorvastatin  - Get periodic LFT TSH  Diabetes Glucoses controlled on sliding scale corrections - holding SSI for comfort as patient's sugars are less than 180 -Follow intake closely as he tends to hypoglycemia  Transaminitis Resolved  Severe sepsis-resolved MGUS Hypertension-no longer on medication  Aspiration pneumonia-resolved--remains high risk for recurrence Had conversations about this and hopefully we can prevent him from aspirating Downgraded diet 9/26, set up at all times at 45 degrees       Subjective:  More respiratory distress since last evening X-ray shows fluid more so than infection bilaterally  He has no fever I reviewed him later and he was a little less short of breath and wanting to eat We downgraded his diet some based on nursing observations that he was coughing and retaining some  applesauce   Physical Exam: BP 131/76 (BP Location: Left Arm)   Pulse (!) 101   Temp 98.2 F (36.8 C)   Resp 18   Ht 5' 11 (1.803 m)   Wt 72.1 kg   SpO2 94%   BMI 22.18 kg/m    Low voice  Slight tachypnea but was tachypneic to the 30s this morning S1-S2 no murmur   Data Reviewed:  Sodium 142 potassium 4.0 BUN/creatinine 31/0.8 alk phos 292 AST/ALT 39/24 Pro-Cal 0.9 WBC 5.9 hemoglobin 11.4 platelet 271  Family Communication:   Spoke with Para on 9/26  Disposition: Status is: Inpatient         Author: Colen Grimes, MD 08/16/2024 2:14 PM  For on call review [email or web address].

## 2024-08-16 NOTE — Plan of Care (Signed)
  Problem: Fluid Volume: Goal: Hemodynamic stability will improve Outcome: Progressing   Problem: Clinical Measurements: Goal: Diagnostic test results will improve Outcome: Progressing Goal: Signs and symptoms of infection will decrease Outcome: Progressing   Problem: Education: Goal: Knowledge of risk factors and measures for prevention of condition will improve Outcome: Progressing   Problem: Coping: Goal: Psychosocial and spiritual needs will be supported Outcome: Progressing   Problem: Respiratory: Goal: Will maintain a patent airway Outcome: Progressing Goal: Complications related to the disease process, condition or treatment will be avoided or minimized Outcome: Progressing   Problem: Education: Goal: Ability to describe self-care measures that may prevent or decrease complications (Diabetes Survival Skills Education) will improve Outcome: Progressing   Problem: Coping: Goal: Ability to adjust to condition or change in health will improve Outcome: Progressing   Problem: Fluid Volume: Goal: Ability to maintain a balanced intake and output will improve Outcome: Progressing   Problem: Health Behavior/Discharge Planning: Goal: Ability to manage health-related needs will improve Outcome: Progressing   Problem: Metabolic: Goal: Ability to maintain appropriate glucose levels will improve Outcome: Progressing   Problem: Nutritional: Goal: Maintenance of adequate nutrition will improve Outcome: Progressing Goal: Progress toward achieving an optimal weight will improve Outcome: Progressing   Problem: Skin Integrity: Goal: Risk for impaired skin integrity will decrease Outcome: Progressing   Problem: Tissue Perfusion: Goal: Adequacy of tissue perfusion will improve Outcome: Progressing   Problem: Education: Goal: Knowledge of General Education information will improve Description: Including pain rating scale, medication(s)/side effects and non-pharmacologic  comfort measures Outcome: Progressing   Problem: Health Behavior/Discharge Planning: Goal: Ability to manage health-related needs will improve Outcome: Progressing   Problem: Clinical Measurements: Goal: Ability to maintain clinical measurements within normal limits will improve Outcome: Progressing Goal: Will remain free from infection Outcome: Progressing Goal: Diagnostic test results will improve Outcome: Progressing Goal: Respiratory complications will improve Outcome: Progressing Goal: Cardiovascular complication will be avoided Outcome: Progressing   Problem: Activity: Goal: Risk for activity intolerance will decrease Outcome: Progressing   Problem: Nutrition: Goal: Adequate nutrition will be maintained Outcome: Progressing   Problem: Coping: Goal: Level of anxiety will decrease Outcome: Progressing   Problem: Elimination: Goal: Will not experience complications related to bowel motility Outcome: Progressing Goal: Will not experience complications related to urinary retention Outcome: Progressing   Problem: Pain Managment: Goal: General experience of comfort will improve and/or be controlled Outcome: Progressing   Problem: Safety: Goal: Ability to remain free from injury will improve Outcome: Progressing   Problem: Skin Integrity: Goal: Risk for impaired skin integrity will decrease Outcome: Progressing

## 2024-08-17 DIAGNOSIS — A419 Sepsis, unspecified organism: Secondary | ICD-10-CM | POA: Diagnosis not present

## 2024-08-17 DIAGNOSIS — R6520 Severe sepsis without septic shock: Secondary | ICD-10-CM | POA: Diagnosis not present

## 2024-08-17 LAB — CBC WITH DIFFERENTIAL/PLATELET
Abs Immature Granulocytes: 0.05 K/uL (ref 0.00–0.07)
Basophils Absolute: 0.1 K/uL (ref 0.0–0.1)
Basophils Relative: 1 %
Eosinophils Absolute: 0.1 K/uL (ref 0.0–0.5)
Eosinophils Relative: 1 %
HCT: 31.7 % — ABNORMAL LOW (ref 39.0–52.0)
Hemoglobin: 10.6 g/dL — ABNORMAL LOW (ref 13.0–17.0)
Immature Granulocytes: 1 %
Lymphocytes Relative: 14 %
Lymphs Abs: 1.1 K/uL (ref 0.7–4.0)
MCH: 30 pg (ref 26.0–34.0)
MCHC: 33.4 g/dL (ref 30.0–36.0)
MCV: 89.8 fL (ref 80.0–100.0)
Monocytes Absolute: 0.8 K/uL (ref 0.1–1.0)
Monocytes Relative: 10 %
Neutro Abs: 5.7 K/uL (ref 1.7–7.7)
Neutrophils Relative %: 73 %
Platelets: 267 K/uL (ref 150–400)
RBC: 3.53 MIL/uL — ABNORMAL LOW (ref 4.22–5.81)
RDW: 19.5 % — ABNORMAL HIGH (ref 11.5–15.5)
WBC: 7.8 K/uL (ref 4.0–10.5)
nRBC: 0 % (ref 0.0–0.2)

## 2024-08-17 LAB — COMPREHENSIVE METABOLIC PANEL WITH GFR
ALT: 22 U/L (ref 0–44)
AST: 31 U/L (ref 15–41)
Albumin: 2.4 g/dL — ABNORMAL LOW (ref 3.5–5.0)
Alkaline Phosphatase: 305 U/L — ABNORMAL HIGH (ref 38–126)
Anion gap: 9 (ref 5–15)
BUN: 34 mg/dL — ABNORMAL HIGH (ref 8–23)
CO2: 25 mmol/L (ref 22–32)
Calcium: 8.4 mg/dL — ABNORMAL LOW (ref 8.9–10.3)
Chloride: 106 mmol/L (ref 98–111)
Creatinine, Ser: 0.81 mg/dL (ref 0.61–1.24)
GFR, Estimated: >60 mL/min (ref >60)
Glucose, Bld: 208 mg/dL — ABNORMAL HIGH (ref 70–99)
Potassium: 3.8 mmol/L (ref 3.5–5.1)
Sodium: 140 mmol/L (ref 135–145)
Total Bilirubin: 2.1 mg/dL — ABNORMAL HIGH (ref 0.0–1.2)
Total Protein: 5.8 g/dL — ABNORMAL LOW (ref 6.5–8.1)

## 2024-08-17 LAB — GLUCOSE, CAPILLARY
Glucose-Capillary: 200 mg/dL — ABNORMAL HIGH (ref 70–99)
Glucose-Capillary: 188 mg/dL — ABNORMAL HIGH (ref 70–99)
Glucose-Capillary: 201 mg/dL — ABNORMAL HIGH (ref 70–99)

## 2024-08-17 MED ORDER — FUROSEMIDE 40 MG PO TABS
40.0000 mg | ORAL_TABLET | Freq: Two times a day (BID) | ORAL | Status: DC
  Administered 2024-08-17 – 2024-08-18 (×2): 40 mg via ORAL
  Filled 2024-08-17 (×2): qty 1

## 2024-08-17 NOTE — Progress Notes (Signed)
 Progress Note  Patient: Earl [name] FMW:[id number] DOB: 09-12-1941 DOA: 03/04/2024     166 DOS: the patient was seen and examined on 08/17/2024     Brief hospital course: 83 y.o. 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 Lasix  giving IV Lasix  40 and will reassess this afternoon for further  Sacral decubitus ulcer, stage IV Severe protein calorie malnutrition Polyneuropathy, bedbound - bleeding overnight from wound 9/27--nursing instructed to wait until we can reassess and keep dressed today--will assess in am with pressure based dressing. - Continue nutritional supplements, Prosource, Juven  Hypothyroidism - Continue levothyroxine   Paroxysmal atrial fibrillation Cerebrovascular disease, history of stroke NSR last 24 hours. - Continue amiodarone , ELiquis  - Continue atorvastatin  - Get periodic LFT TSH  Diabetes Glucoses controlled on sliding scale corrections - holding SSI for comfort as patient's sugars are less than 180 -Follow intake closely as he tends to hypoglycemia  Transaminitis Resolved  Severe sepsis-resolved MGUS Hypertension-no longer on medication  Aspiration pneumonia-resolved--remains high risk for recurrence Had conversations about this and hopefully we can prevent him from aspirating Continue Dys 1 diet--tolerating  Vol overload 9/27 Was SOB-improved Diuresed with IV lasix  Now on lasix  40 po bid--day to day adjustments Rpt imaging if status changes       Subjective:  Improved RR Some bleed from sacral wound overnight Discussed plan with daughter bedside    Physical Exam: BP 116/70 (BP Location: Left Arm)   Pulse 76   Temp  97.7 F (36.5 C)   Resp 18   Ht 5' 11 (1.803 m)   Wt 72.1 kg   SpO2 97%   BMI 22.18 kg/m    Coherent, RR ~ 15-20 Cta b post Abd soft Some swelling  No LE edema Wounds not reviewed today  Data Reviewed:  CBC    Component Value Date/Time   WBC 7.8 08/17/2024 0831   RBC 3.53 (L) 08/17/2024 0831   HGB 10.6 (L) 08/17/2024 0831   HGB 12.0 (L) 12/22/2021 1013   HGB 12.5 (L) 10/25/2017 0752   HCT 31.7 (L) 08/17/2024 0831   HCT 37.6 (L) 10/25/2017 0752   PLT 267 08/17/2024 0831   PLT 178 12/22/2021 1013   PLT 184 10/25/2017 0752   MCV 89.8 08/17/2024 0831   MCV 86.3 10/25/2017 0752   MCH 30.0 08/17/2024 0831   MCHC 33.4 08/17/2024 0831   RDW 19.5 (H) 08/17/2024 0831   RDW 13.5 10/25/2017 0752   LYMPHSABS 1.1 08/17/2024 0831   LYMPHSABS 2.1 10/25/2017 0752   MONOABS 0.8 08/17/2024 0831   MONOABS 0.5 10/25/2017 0752   EOSABS 0.1 08/17/2024 0831   EOSABS 0.2 10/25/2017 0752   BASOSABS 0.1 08/17/2024 0831   BASOSABS 0.0 10/25/2017 0752   BMET    Component Value Date/Time   NA 140 08/17/2024 0831   NA 141 08/23/2023 1541   NA 140 10/25/2017 0753   K 3.8 08/17/2024 0831   K 4.2 10/25/2017 0753   CL 106 08/17/2024 0831   CL 101 10/23/2012 0802   CO2 25 08/17/2024 0831   CO2 21 (L) 10/25/2017 0753   GLUCOSE 208 (H) 08/17/2024 0831   GLUCOSE 160 (H) 10/25/2017 0753   GLUCOSE 149 (H) 10/23/2012 0802   BUN 34 (H) 08/17/2024  0831   BUN 23 08/23/2023 1541   BUN 13.1 10/25/2017 0753   CREATININE 0.81 08/17/2024 0831   CREATININE 1.00 10/31/2023 1601   CREATININE 1.1 10/25/2017 0753   CALCIUM  8.4 (L) 08/17/2024 0831   CALCIUM  9.1 10/25/2017 0753   EGFR 75 10/31/2023 1601   EGFR 75 08/23/2023 1541   GFRNONAA >60 08/17/2024 0831   GFRNONAA >60 12/22/2021 1013   GFRNONAA 67 05/17/2021 1208     Family Communication:   Spoke with Para on 9/27  Disposition: Status is: Inpatient         Author: Colen Grimes, MD 08/17/2024 5:49 PM  For on call  review [email or web address].

## 2024-08-17 NOTE — Plan of Care (Signed)
  Problem: Fluid Volume: Goal: Hemodynamic stability will improve Outcome: Progressing   Problem: Clinical Measurements: Goal: Diagnostic test results will improve Outcome: Progressing Goal: Signs and symptoms of infection will decrease Outcome: Progressing   Problem: Education: Goal: Knowledge of risk factors and measures for prevention of condition will improve Outcome: Progressing   Problem: Coping: Goal: Psychosocial and spiritual needs will be supported Outcome: Progressing   Problem: Respiratory: Goal: Will maintain a patent airway Outcome: Progressing Goal: Complications related to the disease process, condition or treatment will be avoided or minimized Outcome: Progressing   Problem: Education: Goal: Ability to describe self-care measures that may prevent or decrease complications (Diabetes Survival Skills Education) will improve Outcome: Progressing   Problem: Coping: Goal: Ability to adjust to condition or change in health will improve Outcome: Progressing   Problem: Fluid Volume: Goal: Ability to maintain a balanced intake and output will improve Outcome: Progressing   Problem: Health Behavior/Discharge Planning: Goal: Ability to manage health-related needs will improve Outcome: Progressing   Problem: Metabolic: Goal: Ability to maintain appropriate glucose levels will improve Outcome: Progressing   Problem: Nutritional: Goal: Maintenance of adequate nutrition will improve Outcome: Progressing Goal: Progress toward achieving an optimal weight will improve Outcome: Progressing   Problem: Skin Integrity: Goal: Risk for impaired skin integrity will decrease Outcome: Progressing   Problem: Tissue Perfusion: Goal: Adequacy of tissue perfusion will improve Outcome: Progressing   Problem: Education: Goal: Knowledge of General Education information will improve Description: Including pain rating scale, medication(s)/side effects and non-pharmacologic  comfort measures Outcome: Progressing   Problem: Health Behavior/Discharge Planning: Goal: Ability to manage health-related needs will improve Outcome: Progressing   Problem: Clinical Measurements: Goal: Ability to maintain clinical measurements within normal limits will improve Outcome: Progressing Goal: Will remain free from infection Outcome: Progressing Goal: Diagnostic test results will improve Outcome: Progressing Goal: Respiratory complications will improve Outcome: Progressing Goal: Cardiovascular complication will be avoided Outcome: Progressing   Problem: Activity: Goal: Risk for activity intolerance will decrease Outcome: Progressing   Problem: Nutrition: Goal: Adequate nutrition will be maintained Outcome: Progressing   Problem: Coping: Goal: Level of anxiety will decrease Outcome: Progressing   Problem: Elimination: Goal: Will not experience complications related to bowel motility Outcome: Progressing Goal: Will not experience complications related to urinary retention Outcome: Progressing   Problem: Pain Managment: Goal: General experience of comfort will improve and/or be controlled Outcome: Progressing   Problem: Safety: Goal: Ability to remain free from injury will improve Outcome: Progressing   Problem: Skin Integrity: Goal: Risk for impaired skin integrity will decrease Outcome: Progressing

## 2024-08-18 ENCOUNTER — Inpatient Hospital Stay (HOSPITAL_COMMUNITY)

## 2024-08-18 DIAGNOSIS — A419 Sepsis, unspecified organism: Secondary | ICD-10-CM | POA: Diagnosis not present

## 2024-08-18 DIAGNOSIS — R6520 Severe sepsis without septic shock: Secondary | ICD-10-CM | POA: Diagnosis not present

## 2024-08-18 LAB — CBC WITH DIFFERENTIAL/PLATELET
Abs Immature Granulocytes: 0.04 K/uL (ref 0.00–0.07)
Basophils Absolute: 0 K/uL (ref 0.0–0.1)
Basophils Relative: 1 %
Eosinophils Absolute: 0.2 K/uL (ref 0.0–0.5)
Eosinophils Relative: 3 %
HCT: 31.3 % — ABNORMAL LOW (ref 39.0–52.0)
Hemoglobin: 10.5 g/dL — ABNORMAL LOW (ref 13.0–17.0)
Immature Granulocytes: 1 %
Lymphocytes Relative: 13 %
Lymphs Abs: 0.8 K/uL (ref 0.7–4.0)
MCH: 30.3 pg (ref 26.0–34.0)
MCHC: 33.5 g/dL (ref 30.0–36.0)
MCV: 90.2 fL (ref 80.0–100.0)
Monocytes Absolute: 0.6 K/uL (ref 0.1–1.0)
Monocytes Relative: 9 %
Neutro Abs: 4.7 K/uL (ref 1.7–7.7)
Neutrophils Relative %: 73 %
Platelets: 272 K/uL (ref 150–400)
RBC: 3.47 MIL/uL — ABNORMAL LOW (ref 4.22–5.81)
RDW: 19.1 % — ABNORMAL HIGH (ref 11.5–15.5)
WBC: 6.3 K/uL (ref 4.0–10.5)
nRBC: 0 % (ref 0.0–0.2)

## 2024-08-18 LAB — GLUCOSE, CAPILLARY
Glucose-Capillary: 175 mg/dL — ABNORMAL HIGH (ref 70–99)
Glucose-Capillary: 192 mg/dL — ABNORMAL HIGH (ref 70–99)
Glucose-Capillary: 204 mg/dL — ABNORMAL HIGH (ref 70–99)
Glucose-Capillary: 254 mg/dL — ABNORMAL HIGH (ref 70–99)

## 2024-08-18 LAB — BASIC METABOLIC PANEL WITH GFR
Anion gap: 11 (ref 5–15)
BUN: 35 mg/dL — ABNORMAL HIGH (ref 8–23)
CO2: 25 mmol/L (ref 22–32)
Calcium: 8.2 mg/dL — ABNORMAL LOW (ref 8.9–10.3)
Chloride: 105 mmol/L (ref 98–111)
Creatinine, Ser: 0.86 mg/dL (ref 0.61–1.24)
GFR, Estimated: >60 mL/min (ref >60)
Glucose, Bld: 185 mg/dL — ABNORMAL HIGH (ref 70–99)
Potassium: 3.6 mmol/L (ref 3.5–5.1)
Sodium: 141 mmol/L (ref 135–145)

## 2024-08-18 MED ORDER — FUROSEMIDE 40 MG PO TABS
40.0000 mg | ORAL_TABLET | Freq: Every day | ORAL | Status: DC
  Administered 2024-08-18 – 2024-08-20 (×3): 40 mg via ORAL
  Filled 2024-08-18 (×3): qty 1

## 2024-08-18 MED ORDER — IPRATROPIUM-ALBUTEROL 0.5-2.5 (3) MG/3ML IN SOLN: 3.0000 mL | Freq: Four times a day (QID) | RESPIRATORY_TRACT | Status: DC | PRN

## 2024-08-18 MED ORDER — METFORMIN HCL ER 500 MG PO TB24
500.0000 mg | ORAL_TABLET | Freq: Every day | ORAL | Status: DC
  Administered 2024-08-19 – 2024-08-21 (×3): 500 mg via ORAL
  Filled 2024-08-18 (×4): qty 1

## 2024-08-18 NOTE — Progress Notes (Signed)
 Progress Note  Patient: Earl [name] FMW:[id number] DOB: Aug 08, 1941 DOA: 03/04/2024     167 DOS: the patient was seen and examined on 08/18/2024     Brief hospital course: 83 y.o. 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 - Continue tafamidis  - Continue quarterly Amvuttra  when able - Admittedly received IV Lasix  now back on Lasix  40 daily-watch for volume overload  Sacral decubitus ulcer, stage IV Severe protein calorie malnutrition Polyneuropathy, bedbound - bleeding overnight from wound 9/27--seems like some bleeding also when soapsuds abnormal given Hemoglobin is stable no overt bleeding may have stercoral colitis -Wound care to follow-up with regards to lower extremity and sacrum dressings - Avoid to frequent soapsuds enema discontinue fleets at family request - Continue nutritional supplements, Prosource, Juven  Hypothyroidism - Continue levothyroxine   Paroxysmal atrial fibrillation Cerebrovascular disease, history of stroke - Continue amiodarone , ELiquis  - Continue atorvastatin  - Get periodic LFT TSH  Diabetes Glucoses controlled on sliding scale corrections - holding SSI for comfort as patient's sugars are less than 180 -Has had hypoglycemia previously so hesitate to use insulin  if he does not eat enough -Will start back low-dose metformin  XR500 every morning  Transaminitis Resolved  Severe sepsis-resolved MGUS Hypertension-no longer on medication  Aspiration pneumonia-resolved--remains high risk for recurrence Had conversations about this and hopefully we can prevent him from aspirating Continue Dys 1 diet--tolerating  Vol overload 9/27 Was SOB-improved--see above Rpt imaging if status  changes       Subjective:  Breathing is better he is a little bit sleepy He arouses some He still seems somewhat swollen lower extremities   Physical Exam: BP (!) 91/53   Pulse 78   Temp 97.6 F (36.4 C) (Oral)   Resp 18   Ht 5' 11 (1.803 m)   Wt 72.1 kg   SpO2 92%   BMI 22.18 kg/m    Coherent, RR ~ 15-20 Cta b post Abd soft Some swelling  No LE edema Wounds not reviewed today  Data Reviewed:  CBC    Component Value Date/Time   WBC 6.3 08/18/2024 0818   RBC 3.47 (L) 08/18/2024 0818   HGB 10.5 (L) 08/18/2024 0818   HGB 12.0 (L) 12/22/2021 1013   HGB 12.5 (L) 10/25/2017 0752   HCT 31.3 (L) 08/18/2024 0818   HCT 37.6 (L) 10/25/2017 0752   PLT 272 08/18/2024 0818   PLT 178 12/22/2021 1013   PLT 184 10/25/2017 0752   MCV 90.2 08/18/2024 0818   MCV 86.3 10/25/2017 0752   MCH 30.3 08/18/2024 0818   MCHC 33.5 08/18/2024 0818   RDW 19.1 (H) 08/18/2024 0818   RDW 13.5 10/25/2017 0752   LYMPHSABS 0.8 08/18/2024 0818   LYMPHSABS 2.1 10/25/2017 0752   MONOABS 0.6 08/18/2024 0818   MONOABS 0.5 10/25/2017 0752   EOSABS 0.2 08/18/2024 0818   EOSABS 0.2 10/25/2017 0752   BASOSABS 0.0 08/18/2024 0818   BASOSABS 0.0 10/25/2017 0752   BMET    Component Value Date/Time   NA 141 08/18/2024 0818   NA 141 08/23/2023 1541   NA 140 10/25/2017 0753   K 3.6 08/18/2024 0818   K 4.2 10/25/2017 0753   CL 105 08/18/2024 0818   CL 101 10/23/2012 0802   CO2 25 08/18/2024 0818   CO2 21 (L) 10/25/2017 0753   GLUCOSE  185 (H) 08/18/2024 0818   GLUCOSE 160 (H) 10/25/2017 0753   GLUCOSE 149 (H) 10/23/2012 0802   BUN 35 (H) 08/18/2024 0818   BUN 23 08/23/2023 1541   BUN 13.1 10/25/2017 0753   CREATININE 0.86 08/18/2024 0818   CREATININE 1.00 10/31/2023 1601   CREATININE 1.1 10/25/2017 0753   CALCIUM  8.2 (L) 08/18/2024 0818   CALCIUM  9.1 10/25/2017 0753   EGFR 75 10/31/2023 1601   EGFR 75 08/23/2023 1541   GFRNONAA >60 08/18/2024 0818   GFRNONAA >60 12/22/2021 1013    GFRNONAA 67 05/17/2021 1208     Family Communication:   Spoke with Para on 9/28  Disposition: Status is: Inpatient         Author: Colen Grimes, MD 08/18/2024 12:19 PM  For on call review [email or web address].

## 2024-08-18 NOTE — Plan of Care (Signed)
  Problem: Fluid Volume: Goal: Hemodynamic stability will improve Outcome: Progressing   Problem: Clinical Measurements: Goal: Diagnostic test results will improve Outcome: Progressing Goal: Signs and symptoms of infection will decrease Outcome: Progressing   Problem: Education: Goal: Knowledge of risk factors and measures for prevention of condition will improve Outcome: Progressing   Problem: Coping: Goal: Psychosocial and spiritual needs will be supported Outcome: Progressing   Problem: Respiratory: Goal: Will maintain a patent airway Outcome: Progressing Goal: Complications related to the disease process, condition or treatment will be avoided or minimized Outcome: Progressing   Problem: Education: Goal: Ability to describe self-care measures that may prevent or decrease complications (Diabetes Survival Skills Education) will improve Outcome: Progressing   Problem: Coping: Goal: Ability to adjust to condition or change in health will improve Outcome: Progressing   Problem: Fluid Volume: Goal: Ability to maintain a balanced intake and output will improve Outcome: Progressing   Problem: Health Behavior/Discharge Planning: Goal: Ability to manage health-related needs will improve Outcome: Progressing   Problem: Metabolic: Goal: Ability to maintain appropriate glucose levels will improve Outcome: Progressing   Problem: Nutritional: Goal: Maintenance of adequate nutrition will improve Outcome: Progressing Goal: Progress toward achieving an optimal weight will improve Outcome: Progressing   Problem: Skin Integrity: Goal: Risk for impaired skin integrity will decrease Outcome: Progressing   Problem: Tissue Perfusion: Goal: Adequacy of tissue perfusion will improve Outcome: Progressing   Problem: Education: Goal: Knowledge of General Education information will improve Description: Including pain rating scale, medication(s)/side effects and non-pharmacologic  comfort measures Outcome: Progressing   Problem: Health Behavior/Discharge Planning: Goal: Ability to manage health-related needs will improve Outcome: Progressing   Problem: Clinical Measurements: Goal: Ability to maintain clinical measurements within normal limits will improve Outcome: Progressing Goal: Will remain free from infection Outcome: Progressing Goal: Diagnostic test results will improve Outcome: Progressing Goal: Respiratory complications will improve Outcome: Progressing Goal: Cardiovascular complication will be avoided Outcome: Progressing   Problem: Activity: Goal: Risk for activity intolerance will decrease Outcome: Progressing   Problem: Nutrition: Goal: Adequate nutrition will be maintained Outcome: Progressing   Problem: Coping: Goal: Level of anxiety will decrease Outcome: Progressing   Problem: Elimination: Goal: Will not experience complications related to bowel motility Outcome: Progressing Goal: Will not experience complications related to urinary retention Outcome: Progressing   Problem: Pain Managment: Goal: General experience of comfort will improve and/or be controlled Outcome: Progressing   Problem: Safety: Goal: Ability to remain free from injury will improve Outcome: Progressing   Problem: Skin Integrity: Goal: Risk for impaired skin integrity will decrease Outcome: Progressing

## 2024-08-19 DIAGNOSIS — A419 Sepsis, unspecified organism: Secondary | ICD-10-CM | POA: Diagnosis not present

## 2024-08-19 DIAGNOSIS — R6520 Severe sepsis without septic shock: Secondary | ICD-10-CM | POA: Diagnosis not present

## 2024-08-19 LAB — GLUCOSE, CAPILLARY
Glucose-Capillary: 193 mg/dL — ABNORMAL HIGH (ref 70–99)
Glucose-Capillary: 178 mg/dL — ABNORMAL HIGH (ref 70–99)
Glucose-Capillary: 231 mg/dL — ABNORMAL HIGH (ref 70–99)

## 2024-08-19 NOTE — Progress Notes (Signed)
 Progress Note  Patient: Earl [name] FMW:[id number] DOB: [birth year] DOA: 03/04/2024     168 DOS: the patient was seen and examined on 08/19/2024     Brief hospital course: 83 y.o. 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 - Continue tafamidis  - Continue quarterly Amvuttra  when able - Admittedly received IV Lasix  now back on Lasix  40 daily-watch for volume overload  Sacral decubitus ulcer, stage IV Severe protein calorie malnutrition Polyneuropathy, bedbound - bleeding overnight from wound 9/27--seems like some bleeding also when soapsuds abnormal given Hemoglobin is stable no overt bleeding may have stercoral colitis -Periodic labs -Wound care to follow-up with regards to lower extremity and sacrum dressings - Had stool yesterday---Can continue cautious soapsuds enema but prevent bleeding  -Discontinue fleets at family request - Continue nutritional supplements, Prosource, Juven  Worsening right heel decubitus -Discussed with Dr. [name] and looked at images as below    Dr. [name] feels warrants a below-knee amputation and will discuss this with patient tomorrow I will discuss this with the daughter today  Hypothyroidism - Continue levothyroxine   Paroxysmal atrial fibrillation Cerebrovascular disease, history of stroke - Continue amiodarone , ELiquis  - Continue atorvastatin  - periodic LFT TSH  Diabetes Glucoses controlled on sliding scale corrections - holding SSI for comfort as patient's sugars are less than 180 -Has had hypoglycemia previously so hesitate to use insulin  if he does not eat enough -Will start back low-dose metformin  XR500 every morning  Transaminitis Resolved  Severe sepsis-resolved MGUS Hypertension-no longer on  medication  Aspiration pneumonia-resolved--remains high risk for recurrence Had conversations about this and hopefully we can prevent him from aspirating Continue Dys 1 diet--tolerating  Vol overload 9/27 Was SOB-improved--see above Rpt imaging if status changes       Subjective:  Some cough but clear and expectorates No distress Otherwise fair   Physical Exam: BP 106/69 (BP Location: Left Arm)   Pulse 73   Temp 98 F (36.7 C)   Resp 18   Ht 5' 11 (1.803 m)   Wt 72.1 kg   SpO2 95%   BMI 22.18 kg/m    Coherent, RR ~ 15-20 Cta b post Abd soft Some swelling  No LE edema Reviewed wounds Sacrum as below--slight bleeding but overall base seems clean to me   Data Reviewed:  CBC    Component Value Date/Time   WBC 6.3 08/18/2024 0818   RBC 3.47 (L) 08/18/2024 0818   HGB 10.5 (L) 08/18/2024 0818   HGB 12.0 (L) 12/22/2021 1013   HGB 12.5 (L) 10/25/2017 0752   HCT 31.3 (L) 08/18/2024 0818   HCT 37.6 (L) 10/25/2017 0752   PLT 272 08/18/2024 0818   PLT 178 12/22/2021 1013   PLT 184 10/25/2017 0752   MCV 90.2 08/18/2024 0818   MCV 86.3 10/25/2017 0752   MCH 30.3 08/18/2024 0818   MCHC 33.5 08/18/2024 0818   RDW 19.1 (H) 08/18/2024 0818   RDW 13.5 10/25/2017 0752   LYMPHSABS 0.8 08/18/2024 0818   LYMPHSABS 2.1 10/25/2017 0752   MONOABS 0.6 08/18/2024 0818   MONOABS 0.5 10/25/2017 0752   EOSABS 0.2 08/18/2024 0818   EOSABS 0.2 10/25/2017 0752   BASOSABS 0.0 08/18/2024 0818   BASOSABS 0.0 10/25/2017 0752   BMET    Component Value Date/Time   NA 141 08/18/2024 0818  NA 141 08/23/2023 1541   NA 140 10/25/2017 0753   K 3.6 08/18/2024 0818   K 4.2 10/25/2017 0753   CL 105 08/18/2024 0818   CL 101 10/23/2012 0802   CO2 25 08/18/2024 0818   CO2 21 (L) 10/25/2017 0753   GLUCOSE 185 (H) 08/18/2024 0818   GLUCOSE 160 (H) 10/25/2017 0753   GLUCOSE 149 (H) 10/23/2012 0802   BUN 35 (H) 08/18/2024 0818   BUN 23 08/23/2023 1541   BUN 13.1 10/25/2017 0753    CREATININE 0.86 08/18/2024 0818   CREATININE 1.00 10/31/2023 1601   CREATININE 1.1 10/25/2017 0753   CALCIUM  8.2 (L) 08/18/2024 0818   CALCIUM  9.1 10/25/2017 0753   EGFR 75 10/31/2023 1601   EGFR 75 08/23/2023 1541   GFRNONAA >60 08/18/2024 0818   GFRNONAA >60 12/22/2021 1013   GFRNONAA 67 05/17/2021 1208     Family Communication:   Spoke with Para on 9/28 Called her around 9/29 but did not get her on the phone and left a voicemail  Disposition: Status is: Inpatient         Author: Colen Grimes, MD 08/19/2024 3:20 PM  For on call review [email or web address].

## 2024-08-19 NOTE — Progress Notes (Addendum)
 Long discussion about the heel wound with Para she understands the nature of the wound and I explained to her in some detail that these are notoriously difficult to treat.   She asks for a meeting with Dr. [name] on Wednesday to afford her brother who travels out of town a lot , availability to be able to have a comprehensive discussion about what options may be available--we will try to facilitate her request and give her some time to coordinate this with her brother  I presented that the eventual option may need to be below knee amputation but I will confirm this with Dr. [name]  I once again reiterated that given his low albumin  we may need to think about other options such as only supportive measurement or even discussion with palliative care about other options going forward  Dr. Marvine is aware of these events and plans to follow-up with as well   > 15 minutes prolonged service time  Reggie Grimes, MD [name] 5:51 PM

## 2024-08-19 NOTE — Plan of Care (Signed)
  Problem: Fluid Volume: Goal: Hemodynamic stability will improve Outcome: Not Progressing   Problem: Clinical Measurements: Goal: Diagnostic test results will improve Outcome: Not Progressing Goal: Signs and symptoms of infection will decrease Outcome: Not Progressing   Problem: Education: Goal: Knowledge of risk factors and measures for prevention of condition will improve Outcome: Not Progressing   Problem: Coping: Goal: Psychosocial and spiritual needs will be supported Outcome: Not Progressing   Problem: Respiratory: Goal: Will maintain a patent airway Outcome: Not Progressing Goal: Complications related to the disease process, condition or treatment will be avoided or minimized Outcome: Not Progressing   Problem: Education: Goal: Ability to describe self-care measures that may prevent or decrease complications (Diabetes Survival Skills Education) will improve Outcome: Not Progressing   Problem: Coping: Goal: Ability to adjust to condition or change in health will improve Outcome: Not Progressing   Problem: Fluid Volume: Goal: Ability to maintain a balanced intake and output will improve Outcome: Not Progressing   Problem: Health Behavior/Discharge Planning: Goal: Ability to manage health-related needs will improve Outcome: Not Progressing   Problem: Metabolic: Goal: Ability to maintain appropriate glucose levels will improve Outcome: Not Progressing   Problem: Nutritional: Goal: Maintenance of adequate nutrition will improve Outcome: Not Progressing Goal: Progress toward achieving an optimal weight will improve Outcome: Not Progressing   Problem: Skin Integrity: Goal: Risk for impaired skin integrity will decrease Outcome: Not Progressing   Problem: Tissue Perfusion: Goal: Adequacy of tissue perfusion will improve Outcome: Not Progressing   Problem: Education: Goal: Knowledge of General Education information will improve Description: Including pain  rating scale, medication(s)/side effects and non-pharmacologic comfort measures Outcome: Not Progressing   Problem: Health Behavior/Discharge Planning: Goal: Ability to manage health-related needs will improve Outcome: Not Progressing   Problem: Clinical Measurements: Goal: Ability to maintain clinical measurements within normal limits will improve Outcome: Not Progressing Goal: Will remain free from infection Outcome: Not Progressing Goal: Diagnostic test results will improve Outcome: Not Progressing Goal: Respiratory complications will improve Outcome: Not Progressing Goal: Cardiovascular complication will be avoided Outcome: Not Progressing   Problem: Activity: Goal: Risk for activity intolerance will decrease Outcome: Not Progressing   Problem: Nutrition: Goal: Adequate nutrition will be maintained Outcome: Not Progressing   Problem: Coping: Goal: Level of anxiety will decrease Outcome: Not Progressing   Problem: Elimination: Goal: Will not experience complications related to bowel motility Outcome: Not Progressing Goal: Will not experience complications related to urinary retention Outcome: Not Progressing   Problem: Pain Managment: Goal: General experience of comfort will improve and/or be controlled Outcome: Not Progressing   Problem: Safety: Goal: Ability to remain free from injury will improve Outcome: Not Progressing   Problem: Skin Integrity: Goal: Risk for impaired skin integrity will decrease Outcome: Not Progressing

## 2024-08-19 NOTE — Progress Notes (Signed)
 Nutrition Follow-up  DOCUMENTATION CODES:   Severe malnutrition in context of chronic illness  INTERVENTION:  -Continue puree diet with thin liquids per pt's daughter's request, allow daughter to bring in preferred foods to feed in the evenings when she visits -Continue 1 packet Juven BID, each packet provides 95 calories, 2.5 grams of protein (collagen), and 9.8 grams of carbohydrate (3 grams sugar); also contains 7 grams of L-arginine and L-glutamine, 300 mg vitamin C , 15 mg vitamin E, 1.2 mcg vitamin B-12, 9.5 mg zinc , 200 mg calcium , and 1.5 g  Calcium  Beta-hydroxy-Beta-methylbutyrate to support wound healing  -Continue mixing 30 ml ProSource Plus BID with Juven, each supplement provides 100 kcals and 15 grams protein.  -Continue Ensure Plus High Protein po BID, each supplement provides 350 kcal and 20 grams of protein  -Continue weekly weights -Continue 10,000 units vitamin A  daily due to chronic deficiency (6.5 on 08/05/24) -Continue MVI with minerals daily -Continue feeding assistance with meals     NUTRITION DIAGNOSIS:   Severe Malnutrition related to chronic illness as evidenced by energy intake < or equal to 75% for > or equal to 1 month, severe muscle depletion.  Ongoing  GOAL:   Patient will meet greater than or equal to 90% of their needs  Met  MONITOR:   PO intake, Supplement acceptance, Weight trends, Skin  REASON FOR ASSESSMENT:   Consult Assessment of nutrition requirement/status  ASSESSMENT:   83 year old male who presented to the ED on 03/04/24 from [facility] due to sacral pain. PMH of T2DM, HTN, HLD, CHF, atrial fibrillation, stroke with right-sided weakness, GERD, BPH, hypothyroidism, MGUS, transthyretin associated amyloid cardiomyopathy, chronic pancytopenia, recent hospital admission for COVID-19. Pt admitted with sepsis secondary to sacral pressure injury infection.  Pt seen in resting in room, reports that he ate about half of breakfast this  morning and doesn't remember if he had any of his supplements. Bleeding noted from sacral wound, WOC following. Last weight taken 9/18, new weight order placed. Disposition ongoing, plan for SNF once stable.   Labs Glu 185 BUN 35 Calcium  8.2   Medications  (feeding supplement) PROSource Plus  30 mL Oral BID BM   amiodarone   200 mg Oral Daily   apixaban   5 mg Oral BID   atorvastatin   10 mg Oral Daily   collagenase    Topical Daily   diclofenac  Sodium  4 g Topical QID   feeding supplement  237 mL Oral BID BM   ferrous sulfate   325 mg Oral QODAY   fluticasone   1 spray Each Nare Daily   furosemide   40 mg Oral Daily   levothyroxine   50 mcg Oral Q0600   lidocaine   1 patch Transdermal Q24H   liver oil-zinc  oxide   Topical BID   loratadine   10 mg Oral Daily   metFORMIN   500 mg Oral Q breakfast   multivitamin with minerals  1 tablet Oral Daily   nutrition supplement (JUVEN)  1 packet Oral Daily   nutrition supplement (JUVEN)  1 packet Oral q1600   mouth rinse  15 mL Mouth Rinse 4 times per day   pantoprazole   40 mg Oral Daily   polyethylene glycol  17 g Oral Daily   sertraline   50 mg Oral Daily   silver  nitrate applicators  2 Stick Topical Once   sodium chloride  flush  10-40 mL Intracatheter Q12H   sorbitol   30 mL Oral Daily   Tafamidis   1 capsule Oral Daily   vitamin A   10,000 Units  Oral Daily   vutrisiran  sodium  25 mg Subcutaneous Q90 days    Diet Order:   Diet Order             DIET - DYS 1 Room service appropriate? Yes; Fluid consistency: Thin  Diet effective now                   EDUCATION NEEDS:   No education needs have been identified at this time  Skin:  Skin Assessment: Skin Integrity Issues: Skin Integrity Issues:: Unstageable, Stage IV, Wound VAC DTI: - Stage II: - Stage IV: lt trochanter (wound vac placed 07/16/24) Unstageable: lt shoulder, rt heel Wound Vac: lt trochanter Other: -  Last BM:  9/28  Height:   Ht Readings from Last 1 Encounters:   03/04/24 5' 11 (1.803 m)    Weight:   Wt Readings from Last 1 Encounters:  08/08/24 72.1 kg    Ideal Body Weight:  78.2 kg  BMI:  Body mass index is 22.18 kg/m.  Estimated Nutritional Needs:   Kcal:  1900-2100  Protein:  100-115g  Fluid:  >/=1.9L  Rashel Okeefe, MS, RD, LDN Clinical Dietitian  Please see AMiON for contact information.

## 2024-08-19 NOTE — Plan of Care (Signed)
  Problem: Fluid Volume: Goal: Hemodynamic stability will improve Outcome: Progressing   Problem: Clinical Measurements: Goal: Diagnostic test results will improve Outcome: Progressing Goal: Signs and symptoms of infection will decrease Outcome: Progressing   Problem: Education: Goal: Knowledge of risk factors and measures for prevention of condition will improve Outcome: Progressing   Problem: Coping: Goal: Psychosocial and spiritual needs will be supported Outcome: Progressing   Problem: Respiratory: Goal: Will maintain a patent airway Outcome: Progressing Goal: Complications related to the disease process, condition or treatment will be avoided or minimized Outcome: Progressing   Problem: Education: Goal: Ability to describe self-care measures that may prevent or decrease complications (Diabetes Survival Skills Education) will improve Outcome: Progressing   Problem: Coping: Goal: Ability to adjust to condition or change in health will improve Outcome: Progressing   Problem: Fluid Volume: Goal: Ability to maintain a balanced intake and output will improve Outcome: Progressing   Problem: Health Behavior/Discharge Planning: Goal: Ability to manage health-related needs will improve Outcome: Progressing   Problem: Metabolic: Goal: Ability to maintain appropriate glucose levels will improve Outcome: Progressing   Problem: Nutritional: Goal: Maintenance of adequate nutrition will improve Outcome: Progressing Goal: Progress toward achieving an optimal weight will improve Outcome: Progressing   Problem: Skin Integrity: Goal: Risk for impaired skin integrity will decrease Outcome: Progressing   Problem: Tissue Perfusion: Goal: Adequacy of tissue perfusion will improve Outcome: Progressing   Problem: Education: Goal: Knowledge of General Education information will improve Description: Including pain rating scale, medication(s)/side effects and non-pharmacologic  comfort measures Outcome: Progressing   Problem: Health Behavior/Discharge Planning: Goal: Ability to manage health-related needs will improve Outcome: Progressing   Problem: Clinical Measurements: Goal: Ability to maintain clinical measurements within normal limits will improve Outcome: Progressing Goal: Will remain free from infection Outcome: Progressing Goal: Diagnostic test results will improve Outcome: Progressing Goal: Respiratory complications will improve Outcome: Progressing Goal: Cardiovascular complication will be avoided Outcome: Progressing   Problem: Activity: Goal: Risk for activity intolerance will decrease Outcome: Progressing   Problem: Nutrition: Goal: Adequate nutrition will be maintained Outcome: Progressing   Problem: Coping: Goal: Level of anxiety will decrease Outcome: Progressing   Problem: Elimination: Goal: Will not experience complications related to bowel motility Outcome: Progressing Goal: Will not experience complications related to urinary retention Outcome: Progressing   Problem: Pain Managment: Goal: General experience of comfort will improve and/or be controlled Outcome: Progressing   Problem: Safety: Goal: Ability to remain free from injury will improve Outcome: Progressing   Problem: Skin Integrity: Goal: Risk for impaired skin integrity will decrease Outcome: Progressing

## 2024-08-20 ENCOUNTER — Inpatient Hospital Stay (HOSPITAL_COMMUNITY)

## 2024-08-20 ENCOUNTER — Other Ambulatory Visit (HOSPITAL_COMMUNITY): Payer: Self-pay

## 2024-08-20 ENCOUNTER — Other Ambulatory Visit: Payer: Self-pay

## 2024-08-20 DIAGNOSIS — R000 Tachycardia, unspecified: Secondary | ICD-10-CM | POA: Diagnosis not present

## 2024-08-20 LAB — CBC WITH DIFFERENTIAL/PLATELET
Abs Immature Granulocytes: 0.07 K/uL (ref 0.00–0.07)
Basophils Absolute: 0.1 K/uL (ref 0.0–0.1)
Basophils Relative: 1 %
Eosinophils Absolute: 0.2 K/uL (ref 0.0–0.5)
Eosinophils Relative: 4 %
HCT: 32.3 % — ABNORMAL LOW (ref 39.0–52.0)
Hemoglobin: 10.6 g/dL — ABNORMAL LOW (ref 13.0–17.0)
Immature Granulocytes: 1 %
Lymphocytes Relative: 17 %
Lymphs Abs: 1.1 K/uL (ref 0.7–4.0)
MCH: 29.9 pg (ref 26.0–34.0)
MCHC: 32.8 g/dL (ref 30.0–36.0)
MCV: 91.2 fL (ref 80.0–100.0)
Monocytes Absolute: 0.7 K/uL (ref 0.1–1.0)
Monocytes Relative: 11 %
Neutro Abs: 4.3 K/uL (ref 1.7–7.7)
Neutrophils Relative %: 66 %
Platelets: 272 K/uL (ref 150–400)
RBC: 3.54 MIL/uL — ABNORMAL LOW (ref 4.22–5.81)
RDW: 19.1 % — ABNORMAL HIGH (ref 11.5–15.5)
WBC: 6.5 K/uL (ref 4.0–10.5)
nRBC: 0 % (ref 0.0–0.2)

## 2024-08-20 LAB — COMPREHENSIVE METABOLIC PANEL WITH GFR
ALT: 21 U/L (ref 0–44)
AST: 33 U/L (ref 15–41)
Albumin: 2.2 g/dL — ABNORMAL LOW (ref 3.5–5.0)
Alkaline Phosphatase: 276 U/L — ABNORMAL HIGH (ref 38–126)
Anion gap: 11 (ref 5–15)
BUN: 45 mg/dL — ABNORMAL HIGH (ref 8–23)
CO2: 25 mmol/L (ref 22–32)
Calcium: 8.4 mg/dL — ABNORMAL LOW (ref 8.9–10.3)
Chloride: 104 mmol/L (ref 98–111)
Creatinine, Ser: 0.87 mg/dL (ref 0.61–1.24)
GFR, Estimated: >60 mL/min (ref >60)
Glucose, Bld: 155 mg/dL — ABNORMAL HIGH (ref 70–99)
Potassium: 4 mmol/L (ref 3.5–5.1)
Sodium: 140 mmol/L (ref 135–145)
Total Bilirubin: 2.2 mg/dL — ABNORMAL HIGH (ref 0.0–1.2)
Total Protein: 5.5 g/dL — ABNORMAL LOW (ref 6.5–8.1)

## 2024-08-20 LAB — GLUCOSE, CAPILLARY
Glucose-Capillary: 143 mg/dL — ABNORMAL HIGH (ref 70–99)
Glucose-Capillary: 157 mg/dL — ABNORMAL HIGH (ref 70–99)
Glucose-Capillary: 165 mg/dL — ABNORMAL HIGH (ref 70–99)
Glucose-Capillary: 177 mg/dL — ABNORMAL HIGH (ref 70–99)
Glucose-Capillary: 194 mg/dL — ABNORMAL HIGH (ref 70–99)

## 2024-08-20 MED ORDER — FUROSEMIDE 20 MG PO TABS
20.0000 mg | ORAL_TABLET | Freq: Every day | ORAL | Status: DC
  Administered 2024-08-21: 20 mg via ORAL
  Filled 2024-08-20: qty 1

## 2024-08-20 NOTE — Consult Note (Addendum)
 WOC Nurse wound follow up Wound type: Stage 3 pressure injury left shoulder Stage 4 pressure injury: left trochanter Stage 4 Pressure Injury; right heel Stage 4 Pressure Injury; sacrum Measurement: Left shoulder: 0.2 cm x 0.2 cm Left trochanter: 2 cm x 2 cm x 1 cm with 5 cm undermining from 1 o'clock to 4 o'clock and 6 cm tunnel at 2 o'clock.  Right heel: 3 cm x 3 cm x 1 cm Sacrum: 6 cm x 4 cm x 1 cm with undermining from 12 o'clock to 1 o'clock measuring 1 cm and from 6 o'clock to 8 o'clock measuring 3 cm  Wound bed:  Left shoulder: 60% pink, moist, 40% yellow Left trochanter 70% red, moist, 30% yellow slough  Right heel: area of black eschar has lifted, revealing a 40% red, moist 60% yellow slough wound bed (case discussed with attending and orthopedics with planned modifications to wound care outlined below, noted plans to discuss ortho recommendations with family scheduled for Wednesday) Sacrum: 100 % red Drainage (amount, consistency, odor)  Shoulder: scant, serous Trochanter: serosanguinous in cannister Right heel: moderate, purulent Sacrum: moderate, sanguinous Periwound: intact Dressing procedure/placement/frequency: 1- Left shoulder   Cleanse with NS, pat dry, Apply 1/4 thick layer of Santyl  to wound bed, top with saline moist gauze. Top with silicone foam.  Change daily. 2- Left trochanter: NPWT WOC will change 2x weekly 3 - Sacral: Cleanse with Vashe ([id number]) Place Aquacel AG (lawson # [id number]) over wound bed, tuck into the undermining area using a cotton tipped applicator. Top with silicone foam. Change daily.  4- Right heel; Cleanse with Vashe( lawson # [id number]), cover wound with Vashe moistened 4x 4 gauze, cover with silicone foam dressing. Change daily.  Off load bilateral heels with Prevalon boots at all times. LALM in place for moisture management and pressure redistribution.  Left trochanter: Removed old NPWT dressing to trochanter  Cleansed wound with normal  saline   Filled wound with   __1__ piece of black foam  Sealed NPWT dressing at HG   Patient tolerated procedure well.  WOC nurse will continue to provide NPWT dressing changed due to the complexity of the dressing change, noted wound dimensions are narrow with increasing difficulty with tucking VAC foam into wound, have discussed with attending and plan to transition to a Vashe soaked wet to dry dressing at next NPWT dressing change.   Plan of care discussed with daughter,  Para Fickett, via telephone call.  WOC Nurse team will follow with you and see patient within 10 days for wound assessments.  Please notify WOC nurses of any acute changes in the wounds or any new areas of concern   Doyal Polite, RN, MSN, [facility] WOC Team [phone] (Available Mon-Fri 0700-1500)

## 2024-08-20 NOTE — Progress Notes (Signed)
 Palliative Care Progress Note  Connected with patient's daughter Para by phone re: to offer support and to follow up following orthopedic evaluation of heel wound and after receiving message requesting return call after bleeding from sacral wound reported. Since last visit patient continues to have challenges related to wound care and healing. We were able to change out his bed, but his daughter now reports an issue with the rails of bed being split. She has also not received requested MAR and patient records. She also reports concerns about her father's wound care, recent bleeding sacral decubitus and expresses frustration over varying skill levels with wound care and consistent dressing change practices. Additionally she reports patient having significant pain untreated with PRNs over the weekend to the point where he was shaking and in tears.  [name] is however eating better, but needs assistance with this.  Recommendations:  Discussed resuming scheduled Tylenol  for pain and use the oxycodone  for breakthrough pain prn. Will request records and [facility] for her review-needs my chart access. Next major issue that needs to be addressed is the heel decubitus-ortho recommending amputation for worsening infection and management.and we will need to discuss this  in the context of his goals of care.He is fortunately not having severe or intractable pain in his heel,. Need to explore non invasive wound care and management if possible. Plan to meet tomorrow evening around 8PM-this is a standing meeting. She is working on connecting w her brother for meeting Wednesday with care team.  Almarie General, DO Palliative Medicine

## 2024-08-20 NOTE — Plan of Care (Signed)
 Problem: Fluid Volume: Goal: Hemodynamic stability will improve Outcome: Progressing   Problem: Clinical Measurements: Goal: Diagnostic test results will improve Outcome: Progressing Goal: Signs and symptoms of infection will decrease Outcome: Progressing   Problem: Education: Goal: Knowledge of risk factors and measures for prevention of condition will improve Outcome: Progressing   Problem: Coping: Goal: Psychosocial and spiritual needs will be supported Outcome: Progressing   Problem: Respiratory: Goal: Will maintain a patent airway Outcome: Progressing Goal: Complications related to the disease process, condition or treatment will be avoided or minimized Outcome: Progressing   Problem: Education: Goal: Ability to describe self-care measures that may prevent or decrease complications (Diabetes Survival Skills Education) will improve Outcome: Progressing   Problem: Coping: Goal: Ability to adjust to condition or change in health will improve Outcome: Progressing   Problem: Fluid Volume: Goal: Ability to maintain a balanced intake and output will improve Outcome: Progressing   Problem: Health Behavior/Discharge Planning: Goal: Ability to manage health-related needs will improve Outcome: Progressing   Problem: Metabolic: Goal: Ability to maintain appropriate glucose levels will improve Outcome: Progressing   Problem: Nutritional: Goal: Maintenance of adequate nutrition will improve Outcome: Progressing Goal: Progress toward achieving an optimal weight will improve 08/20/2024 1725 by Rosalynn Warren DASEN, RN Outcome: Progressing 08/20/2024 1725 by Rosalynn Warren DASEN, RN Outcome: Progressing   Problem: Skin Integrity: Goal: Risk for impaired skin integrity will decrease 08/20/2024 1725 by Rosalynn Warren DASEN, RN Outcome: Progressing 08/20/2024 1725 by Rosalynn Warren DASEN, RN Outcome: Progressing   Problem: Tissue Perfusion: Goal: Adequacy of tissue perfusion will  improve 08/20/2024 1725 by Rosalynn Warren DASEN, RN Outcome: Progressing 08/20/2024 1725 by Rosalynn Warren DASEN, RN Outcome: Progressing   Problem: Education: Goal: Knowledge of General Education information will improve Description: Including pain rating scale, medication(s)/side effects and non-pharmacologic comfort measures 08/20/2024 1725 by Rosalynn Warren DASEN, RN Outcome: Progressing 08/20/2024 1725 by Rosalynn Warren DASEN, RN Outcome: Progressing   Problem: Health Behavior/Discharge Planning: Goal: Ability to manage health-related needs will improve 08/20/2024 1725 by Rosalynn Warren DASEN, RN Outcome: Progressing 08/20/2024 1725 by Rosalynn Warren DASEN, RN Outcome: Progressing   Problem: Clinical Measurements: Goal: Ability to maintain clinical measurements within normal limits will improve 08/20/2024 1725 by Rosalynn Warren DASEN, RN Outcome: Progressing 08/20/2024 1725 by Rosalynn Warren DASEN, RN Outcome: Progressing Goal: Will remain free from infection 08/20/2024 1725 by Rosalynn Warren DASEN, RN Outcome: Progressing 08/20/2024 1725 by Rosalynn Warren DASEN, RN Outcome: Progressing Goal: Diagnostic test results will improve 08/20/2024 1725 by Rosalynn Warren DASEN, RN Outcome: Progressing 08/20/2024 1725 by Rosalynn Warren DASEN, RN Outcome: Progressing Goal: Respiratory complications will improve 08/20/2024 1725 by Rosalynn Warren DASEN, RN Outcome: Progressing 08/20/2024 1725 by Rosalynn Warren DASEN, RN Outcome: Progressing Goal: Cardiovascular complication will be avoided 08/20/2024 1725 by Rosalynn Warren DASEN, RN Outcome: Progressing 08/20/2024 1725 by Rosalynn Warren DASEN, RN Outcome: Progressing   Problem: Activity: Goal: Risk for activity intolerance will decrease 08/20/2024 1725 by Rosalynn Warren DASEN, RN Outcome: Progressing 08/20/2024 1725 by Rosalynn Warren DASEN, RN Outcome: Progressing   Problem: Nutrition: Goal: Adequate nutrition will be maintained 08/20/2024 1725 by Rosalynn Warren DASEN, RN Outcome: Progressing 08/20/2024 1725  by Rosalynn Warren DASEN, RN Outcome: Progressing   Problem: Coping: Goal: Level of anxiety will decrease 08/20/2024 1725 by Rosalynn Warren DASEN, RN Outcome: Progressing 08/20/2024 1725 by Rosalynn Warren DASEN, RN Outcome: Progressing   Problem: Elimination: Goal: Will not experience complications related to bowel motility 08/20/2024 1725 by Rosalynn Warren DASEN, RN Outcome: Progressing 08/20/2024 1725  by Rosalynn Warren DASEN, RN Outcome: Progressing Goal: Will not experience complications related to urinary retention 08/20/2024 1725 by Rosalynn Warren DASEN, RN Outcome: Progressing 08/20/2024 1725 by Rosalynn Warren DASEN, RN Outcome: Progressing   Problem: Pain Managment: Goal: General experience of comfort will improve and/or be controlled 08/20/2024 1725 by Rosalynn Warren DASEN, RN Outcome: Progressing 08/20/2024 1725 by Rosalynn Warren DASEN, RN Outcome: Progressing   Problem: Safety: Goal: Ability to remain free from injury will improve 08/20/2024 1725 by Rosalynn Warren DASEN, RN Outcome: Progressing 08/20/2024 1725 by Rosalynn Warren DASEN, RN Outcome: Progressing   Problem: Skin Integrity: Goal: Risk for impaired skin integrity will decrease 08/20/2024 1725 by Rosalynn Warren DASEN, RN Outcome: Progressing 08/20/2024 1725 by Rosalynn Warren DASEN, RN Outcome: Progressing

## 2024-08-20 NOTE — Telephone Encounter (Addendum)
 Routing this to Dr. Rolan. It has been a while since Mr. Bollen has been seen in clinic, mostly because of the extended hospitalization. Since Amvuttra  would be expected to prevent disease progression and would not treat any current symptoms of cardiac amyloidosis, it may not be appropriate to continue while hospitalized, especially since he is also on tafamidis  and is listed as DNR limited. In general, we would not administer this medication while admitted given its cost. However, the medical team did allow his daughter to bring in the medication from home last time to administer, so there is now precedent. I think it is important that Dr. Rolan to make the final call.

## 2024-08-20 NOTE — Telephone Encounter (Signed)
 Called [facility] to inquire about procuring the Amvuttra  for him. Representative states that they will not be able to supply the Amvuttra . It appears the previous dispense in June was a mistake and they did not realize the patient was hospitalized. Per the representative, this is a site of care violation and, if audited, could result in the insurance coverage for the medication being reversed. As this medication costs ~$100,000, it is not possible to obtain this from any specialty pharmacy for administration until patient is discharged. It is also not possible to obtain via inpatient stay as Amvuttra  is not on hospital formulary and we are unable to obtain given significant cost.

## 2024-08-20 NOTE — Progress Notes (Signed)
 Patient ID: Earl [name], male   DOB: November 28, 1940, 83 y.o.   MRN: [id number] Patient is seen in follow-up for necrotic right heel ulcer with purulent drainage with ulceration down to bone.  Patient states he is nonambulatory.  Ideally patient would require an above-knee amputation.  By report family would like to talk on Wednesday.  When family is available they can contact me at [phone]

## 2024-08-20 NOTE — Progress Notes (Signed)
 Progress Note  Patient: Earl [name] FMW:[id number] DOB: 1941/09/17 DOA: 03/04/2024     169 DOS: the patient was seen and examined on 08/20/2024     Brief hospital course: 83 y.o. 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 - Continue tafamidis  - Amvuttra  can be given if family have supply from outpatient-see pharmacy notes - Over the past several days received IV Lasix  for volume overload -Lasix  now 20 mg daily, check periodic labs   Sacral decubitus ulcer, stage IV Severe protein calorie malnutrition Polyneuropathy, bedbound - bleeding overnight from wound 9/27--seems like some bleeding also when soapsuds abnormal given Hemoglobin is stable no overt bleeding may have stercoral colitis - Overall hemoglobin has been stable -Wound care notes updated as per 9/30 note - cautious soapsuds enema if daily MiraLAX  does not work -[employer] forces logistics/support/administrative officer fleets at family request - Continue nutritional supplements, Prosource, Juven  Worsening right heel decubitus, at risk for amputation -Discussed with Dr. [name] who will be available 9/30 to chat with family about wound care options versus amputation -X-ray of right heel/foot does not show osteomyelitis Ordering ESR CRP and CT  foot  Hypothyroidism - Continue levothyroxine   Paroxysmal atrial fibrillation Cerebrovascular disease, history of stroke - Continue amiodarone , Eliquis  - Continue atorvastatin  - periodic LFT TSH  Diabetes Glucoses controlled on sliding scale corrections - holding SSI for comfort as patient's sugars are less than 180, sugars are now ranging 140s to 160s with metformin  reinitiated -Has had hypoglycemia previously so hesitate to use insulin  if he does not eat enough -Will start back low-dose  metformin  XR500 every morning  Transaminitis Resolved  Severe sepsis-resolved MGUS Hypertension-no longer on medication  Aspiration pneumonia-resolved--remains high risk for recurrence Had conversations about this and hopefully we can prevent him from aspirating Continue Dys 1 diet--tolerating  Vol overload 9/27 Was SOB-improved--see above Rpt imaging if status changes   Global Patient remains stable but at high risk for decompensation with regards to right heel-palliative medicine involved and will have discussions with patient's family today Patient's daughter has been updated daily and is trying to coordinate with her brother to make medical decisions      Subjective: About the same overall Complaints of pain in the sacral area on turning He is not really short of breath intermittently coughing     Physical Exam: BP 132/84 (BP Location: Right Arm)   Pulse 75   Temp (!) 97.4 F (36.3 C)   Resp 18   Ht 5' 11 (1.803 m)   Wt 72.1 kg   SpO2 97%   BMI 22.18 kg/m    Coherent, not in distress Cta b post Abd soft Some swelling  No LE edema Reviewed wounds Sacrum reviewed as per wound care nurse and I reviewed yesterday 9/29  Data Reviewed:  CBC    Component Value Date/Time   WBC 6.5 08/20/2024 0442   RBC 3.54 (L) 08/20/2024 0442   HGB 10.6 (L) 08/20/2024 0442   HGB 12.0 (L) 12/22/2021 1013   HGB 12.5 (L) 10/25/2017 0752   HCT 32.3 (L) 08/20/2024 0442   HCT 37.6 (L) 10/25/2017 0752   PLT 272 08/20/2024 0442   PLT 178 12/22/2021 1013   PLT 184 10/25/2017 0752   MCV 91.2 08/20/2024 0442   MCV 86.3 10/25/2017 0752   MCH 29.9 08/20/2024 0442  MCHC 32.8 08/20/2024 0442   RDW 19.1 (H) 08/20/2024 0442   RDW 13.5 10/25/2017 0752   LYMPHSABS 1.1 08/20/2024 0442   LYMPHSABS 2.1 10/25/2017 0752   MONOABS 0.7 08/20/2024 0442   MONOABS 0.5 10/25/2017 0752   EOSABS 0.2 08/20/2024 0442   EOSABS 0.2 10/25/2017 0752   BASOSABS 0.1 08/20/2024 0442   BASOSABS  0.0 10/25/2017 0752   BMET    Component Value Date/Time   NA 140 08/20/2024 0442   NA 141 08/23/2023 1541   NA 140 10/25/2017 0753   K 4.0 08/20/2024 0442   K 4.2 10/25/2017 0753   CL 104 08/20/2024 0442   CL 101 10/23/2012 0802   CO2 25 08/20/2024 0442   CO2 21 (L) 10/25/2017 0753   GLUCOSE 155 (H) 08/20/2024 0442   GLUCOSE 160 (H) 10/25/2017 0753   GLUCOSE 149 (H) 10/23/2012 0802   BUN 45 (H) 08/20/2024 0442   BUN 23 08/23/2023 1541   BUN 13.1 10/25/2017 0753   CREATININE 0.87 08/20/2024 0442   CREATININE 1.00 10/31/2023 1601   CREATININE 1.1 10/25/2017 0753   CALCIUM  8.4 (L) 08/20/2024 0442   CALCIUM  9.1 10/25/2017 0753   EGFR 75 10/31/2023 1601   EGFR 75 08/23/2023 1541   GFRNONAA >60 08/20/2024 0442   GFRNONAA >60 12/22/2021 1013   GFRNONAA 67 05/17/2021 1208     Family Communication:   Have updated daughter daily as best possible Conference communication today scheduled with Dr. Marvine of palliative medicine to discuss goals of care See notes from yesterday   Disposition: Status is: Inpatient         Author: Colen Grimes, MD 08/20/2024 4:36 PM  For on call review [email or web address].

## 2024-08-21 ENCOUNTER — Inpatient Hospital Stay (HOSPITAL_COMMUNITY)

## 2024-08-21 ENCOUNTER — Telehealth: Payer: Self-pay | Admitting: Pharmacist

## 2024-08-21 DIAGNOSIS — R6520 Severe sepsis without septic shock: Secondary | ICD-10-CM | POA: Diagnosis not present

## 2024-08-21 DIAGNOSIS — A419 Sepsis, unspecified organism: Secondary | ICD-10-CM | POA: Diagnosis not present

## 2024-08-21 LAB — CBC WITH DIFFERENTIAL/PLATELET
Abs Immature Granulocytes: 0.06 K/uL (ref 0.00–0.07)
Basophils Absolute: 0.1 K/uL (ref 0.0–0.1)
Basophils Relative: 1 %
Eosinophils Absolute: 0.2 K/uL (ref 0.0–0.5)
Eosinophils Relative: 3 %
HCT: 32.9 % — ABNORMAL LOW (ref 39.0–52.0)
Hemoglobin: 10.9 g/dL — ABNORMAL LOW (ref 13.0–17.0)
Immature Granulocytes: 1 %
Lymphocytes Relative: 16 %
Lymphs Abs: 1 K/uL (ref 0.7–4.0)
MCH: 29.9 pg (ref 26.0–34.0)
MCHC: 33.1 g/dL (ref 30.0–36.0)
MCV: 90.4 fL (ref 80.0–100.0)
Monocytes Absolute: 0.6 K/uL (ref 0.1–1.0)
Monocytes Relative: 9 %
Neutro Abs: 4.3 K/uL (ref 1.7–7.7)
Neutrophils Relative %: 70 %
Platelets: 300 K/uL (ref 150–400)
RBC: 3.64 MIL/uL — ABNORMAL LOW (ref 4.22–5.81)
RDW: 19.2 % — ABNORMAL HIGH (ref 11.5–15.5)
WBC: 6.1 K/uL (ref 4.0–10.5)
nRBC: 0 % (ref 0.0–0.2)

## 2024-08-21 LAB — LACTIC ACID, PLASMA
Lactic Acid, Venous: 1.8 mmol/L (ref 0.5–1.9)
Lactic Acid, Venous: 1.3 mmol/L (ref 0.5–1.9)

## 2024-08-21 LAB — C-REACTIVE PROTEIN: CRP: 8 mg/dL — ABNORMAL HIGH (ref <1.0)

## 2024-08-21 LAB — RENAL FUNCTION PANEL
Albumin: 2.3 g/dL — ABNORMAL LOW (ref 3.5–5.0)
Anion gap: 12 (ref 5–15)
BUN: 47 mg/dL — ABNORMAL HIGH (ref 8–23)
CO2: 23 mmol/L (ref 22–32)
Calcium: 8.6 mg/dL — ABNORMAL LOW (ref 8.9–10.3)
Chloride: 105 mmol/L (ref 98–111)
Creatinine, Ser: 0.86 mg/dL (ref 0.61–1.24)
GFR, Estimated: >60 mL/min (ref >60)
Glucose, Bld: 160 mg/dL — ABNORMAL HIGH (ref 70–99)
Phosphorus: 2.8 mg/dL (ref 2.5–4.6)
Potassium: 4.1 mmol/L (ref 3.5–5.1)
Sodium: 140 mmol/L (ref 135–145)

## 2024-08-21 LAB — SEDIMENTATION RATE: Sed Rate: 29 mm/h — ABNORMAL HIGH (ref 0–16)

## 2024-08-21 LAB — GLUCOSE, CAPILLARY
Glucose-Capillary: 137 mg/dL — ABNORMAL HIGH (ref 70–99)
Glucose-Capillary: 205 mg/dL — ABNORMAL HIGH (ref 70–99)
Glucose-Capillary: 191 mg/dL — ABNORMAL HIGH (ref 70–99)

## 2024-08-21 LAB — PROTIME-INR
INR: 2.3 — ABNORMAL HIGH (ref 0.8–1.2)
Prothrombin Time: 26.4 s — ABNORMAL HIGH (ref 11.4–15.2)

## 2024-08-21 MED ORDER — SODIUM CHLORIDE 0.9 % IV SOLN: 250.0000 mL | INTRAVENOUS | Status: AC | PRN

## 2024-08-21 MED ORDER — ALBUMIN HUMAN 25 % IV SOLN
25.0000 g | Freq: Four times a day (QID) | INTRAVENOUS | Status: DC
Start: 2024-08-21 — End: 2024-08-22
  Administered 2024-08-21: 25 g via INTRAVENOUS
  Filled 2024-08-21: qty 100

## 2024-08-21 MED ORDER — SODIUM CHLORIDE 0.9 % IV BOLUS
500.0000 mL | Freq: Once | INTRAVENOUS | Status: AC
Start: 2024-08-21 — End: 2024-08-21
  Administered 2024-08-21: 500 mL via INTRAVENOUS

## 2024-08-21 MED ORDER — HALOPERIDOL LACTATE 5 MG/ML IJ SOLN: 0.5000 mg | INTRAMUSCULAR | Status: DC | PRN

## 2024-08-21 MED ORDER — SODIUM CHLORIDE 0.9% FLUSH: 3.0000 mL | INTRAVENOUS | Status: DC | PRN

## 2024-08-21 MED ORDER — GLYCOPYRROLATE 0.2 MG/ML IJ SOLN
0.4000 mg | INTRAMUSCULAR | Status: DC
  Administered 2024-08-21 – 2024-08-23 (×7): 0.4 mg via INTRAVENOUS
  Filled 2024-08-21 (×7): qty 2

## 2024-08-21 MED ORDER — METRONIDAZOLE 500 MG/100ML IV SOLN
500.0000 mg | Freq: Two times a day (BID) | INTRAVENOUS | Status: DC
  Administered 2024-08-21: 500 mg via INTRAVENOUS
  Filled 2024-08-21: qty 100

## 2024-08-21 MED ORDER — ONDANSETRON HCL 4 MG/2ML IJ SOLN: 4.0000 mg | Freq: Four times a day (QID) | INTRAMUSCULAR | Status: DC | PRN

## 2024-08-21 MED ORDER — HALOPERIDOL LACTATE 2 MG/ML PO CONC: 0.5000 mg | ORAL | Status: DC | PRN

## 2024-08-21 MED ORDER — BIOTENE DRY MOUTH MT LIQD: 15.0000 mL | OROMUCOSAL | Status: DC | PRN

## 2024-08-21 MED ORDER — GLYCOPYRROLATE 0.2 MG/ML IJ SOLN: 0.2000 mg | INTRAMUSCULAR | Status: DC

## 2024-08-21 MED ORDER — ACETAMINOPHEN 325 MG PO TABS: 650.0000 mg | ORAL_TABLET | Freq: Four times a day (QID) | ORAL | Status: DC | PRN

## 2024-08-21 MED ORDER — VANCOMYCIN HCL 1500 MG/300ML IV SOLN
1500.0000 mg | INTRAVENOUS | Status: DC
  Administered 2024-08-21: 1500 mg via INTRAVENOUS
  Filled 2024-08-21: qty 300

## 2024-08-21 MED ORDER — SODIUM CHLORIDE 0.9 % IV BOLUS
500.0000 mL | Freq: Once | INTRAVENOUS | Status: AC
  Administered 2024-08-21: 500 mL via INTRAVENOUS

## 2024-08-21 MED ORDER — SODIUM CHLORIDE 0.9% FLUSH
3.0000 mL | Freq: Two times a day (BID) | INTRAVENOUS | Status: DC
  Administered 2024-08-22: 3 mL via INTRAVENOUS

## 2024-08-21 MED ORDER — GLYCOPYRROLATE 1 MG PO TABS
1.0000 mg | ORAL_TABLET | ORAL | Status: DC
  Filled 2024-08-21 (×3): qty 1

## 2024-08-21 MED ORDER — SODIUM CHLORIDE 0.9 % IV SOLN
2.0000 g | Freq: Three times a day (TID) | INTRAVENOUS | Status: DC
  Administered 2024-08-21: 2 g via INTRAVENOUS
  Filled 2024-08-21: qty 12.5

## 2024-08-21 MED ORDER — HALOPERIDOL 1 MG PO TABS: 0.5000 mg | ORAL_TABLET | ORAL | Status: DC | PRN

## 2024-08-21 MED ORDER — HYDROMORPHONE HCL 1 MG/ML IJ SOLN
0.5000 mg | INTRAMUSCULAR | Status: DC | PRN
  Administered 2024-08-21 – 2024-08-23 (×8): 0.5 mg via INTRAVENOUS
  Filled 2024-08-21 (×8): qty 1

## 2024-08-21 MED ORDER — ACETAMINOPHEN 650 MG RE SUPP: 650.0000 mg | Freq: Four times a day (QID) | RECTAL | Status: DC | PRN

## 2024-08-21 MED ORDER — ONDANSETRON 4 MG PO TBDP: 4.0000 mg | ORAL_TABLET | Freq: Four times a day (QID) | ORAL | Status: DC | PRN

## 2024-08-21 MED ORDER — ACETAMINOPHEN 160 MG/5ML PO SOLN
650.0000 mg | Freq: Four times a day (QID) | ORAL | Status: DC | PRN
  Administered 2024-08-21: 650 mg via ORAL
  Filled 2024-08-21: qty 20.3

## 2024-08-21 MED ORDER — OXYCODONE HCL 5 MG PO TABS
2.5000 mg | ORAL_TABLET | Freq: Once | ORAL | Status: AC
  Administered 2024-08-21: 2.5 mg via ORAL
  Filled 2024-08-21: qty 1

## 2024-08-21 MED ORDER — POLYVINYL ALCOHOL 1.4 % OP SOLN
1.0000 [drp] | Freq: Two times a day (BID) | OPHTHALMIC | Status: DC
  Filled 2024-08-21: qty 15

## 2024-08-21 MED ORDER — VANCOMYCIN HCL 1500 MG/300ML IV SOLN
1500.0000 mg | Freq: Once | INTRAVENOUS | Status: DC
  Filled 2024-08-21: qty 300

## 2024-08-21 MED ORDER — VITAMIN K1 10 MG/ML IJ SOLN
5.0000 mg | Freq: Once | INTRAVENOUS | Status: DC
  Filled 2024-08-21: qty 0.5

## 2024-08-21 NOTE — TOC Progression Note (Signed)
 Transition of Care [facility]) - Progression Note    Patient Details  Name: Earl [name] MRN: [id number] Date of Birth: 1941/01/02  Transition of Care [facility]) CM/SW Contact  Rosaline JONELLE Joe, RN Phone Number: 08/21/2024, 3:01 PM  Clinical Narrative:    IP Care management team will continue to follow the patient while patient remains inpatient in the hospital.  Hospital leadership and Palliative Care medicine are involved and following the patient with continued discussions with patient/family.  Patient with barriers to discharge since admission - patient with complex wound care needs and family requests patient remain in the hospital until patient is able to return home for care once the patient's wounds are healed per family.   Expected Discharge Plan: Skilled Nursing Facility Barriers to Discharge: Continued Medical Work up               Expected Discharge Plan and Services In-house Referral: Clinical Social Work     Living arrangements for the past 2 months:  (from [facility] short term rehab)                                       Social Drivers of Health (SDOH) Interventions SDOH Screenings   Food Insecurity: No Food Insecurity (03/05/2024)  Housing: Low Risk  (03/05/2024)  Transportation Needs: No Transportation Needs (03/05/2024)  Utilities: Not At Risk (03/05/2024)  Depression (PHQ2-9): Low Risk  (10/03/2023)  Financial Resource Strain: Patient Declined (01/16/2024)  Physical Activity: Unknown (01/16/2024)  Social Connections: Moderately Integrated (03/05/2024)  Stress: Stress Concern Present (01/16/2024)  Tobacco Use: Low Risk  (07/04/2024)    Readmission Risk Interventions    03/19/2024   10:24 AM  Readmission Risk Prevention Plan  Transportation Screening Complete  Medication Review (RN Care Manager) Complete  PCP or Specialist appointment within 3-5 days of discharge Complete  HRI or Home Care Consult Complete  SW Recovery Care/Counseling  Consult Complete  Palliative Care Screening Complete  Skilled Nursing Facility Complete

## 2024-08-21 NOTE — Progress Notes (Signed)
 Palliative Care Progress Note  I responded this evening to cal call from Dr. Jonel notifying me of a significant change in patient's status and well as a phone call from Para hs daughter.  Mr.Haapala has declined significantly since yesterday evening. He is mostly unresponsive, will nod and tries to open his eyes-he is too weak to speak, He has audible upper airway secretions. He is diaphoretic, tachypniec. Low BP. Denies pain by head motion or distress. I reviewed available imaging and labs. Elelvated ESR, Pulmonary edema in CXR, minimal urine out out in last 6 hours, upper airway secretions are copious. Given low protein state and hx of cardiac amyloidosis, CHF exacerbation most likely eitiology.  Infectious or sepsis related labs and blood cultures are pending and right foot CT read.  His daughter was at bedside and his son was available on speaker phone. I shared with them the serious and irreversible nature of his condition given heart failure signs and symptoms in setting of his current challenges with overall debility, delayed wound healing and rapid decline.  A strong recommendation was made for transition to comfort care as both the right thing to do medically and the compassionate and humane thing to do at this point. Patient nodded in agreement after his son spoke to him and both his son and daughter understand he is actively dying and comfort care will be the focus. Will continue to provide wound care and aggressive symptom management.  EOL order set placed and care plan discussed with RN A. Atkins. Patients son is in route from [place]. Prognosis is hours to days. I anticipate a hospital death.  Almarie General, DO Palliative Medicine   Time: 80 minutes

## 2024-08-21 NOTE — Telephone Encounter (Signed)
 Relayed information to the patient's daughter regarding inability for [facility] to ship Amvuttra  for administration inpatient for potential audit concerns. She was understanding and all questions were answered to her satisfaction. 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

## 2024-08-21 NOTE — Progress Notes (Incomplete)
 Palliative Care Progress Note  I responed this even to cal call from Dr. Jonel notifying me of a significant change in patient's status [name] has declined significantly since yesterday evening. He is mostly unresponsive, will nod and tries to open his eyes-he is too weak to speak, He has audible upper airway secretions

## 2024-08-21 NOTE — Progress Notes (Signed)
 Pt had an eventful day. Midday pt BP trending soft. bolus given as well as albumin . Pt became sluggish and drowsy. Sepsis protocol started. Daughter is at bedside. Pt is sleepy and withdrawn. Vitals are fairly stable. Pending lab results

## 2024-08-21 NOTE — Sepsis Progress Note (Signed)
 Code Sepsis protocol being monitored by eLink. Secure chat to 2W bedside RN to obtain lactic acid and blood culture; and immediately after labs drawn hang antibiotic.

## 2024-08-21 NOTE — Plan of Care (Signed)
  Problem: Fluid Volume: Goal: Hemodynamic stability will improve 08/21/2024 1537 by Eduardo Sandrea LABOR, RN Outcome: Not Applicable 08/21/2024 1537 by Eduardo Sandrea LABOR, RN Outcome: Progressing   Problem: Clinical Measurements: Goal: Diagnostic test results will improve 08/21/2024 1537 by Eduardo Sandrea LABOR, RN Outcome: Not Applicable 08/21/2024 1537 by Eduardo Sandrea LABOR, RN Outcome: Progressing Goal: Signs and symptoms of infection will decrease 08/21/2024 1537 by Eduardo Sandrea LABOR, RN Outcome: Not Applicable 08/21/2024 1537 by Eduardo Sandrea LABOR, RN Outcome: Progressing   Problem: Education: Goal: Knowledge of risk factors and measures for prevention of condition will improve 08/21/2024 1537 by Eduardo Sandrea LABOR, RN Outcome: Not Applicable 08/21/2024 1537 by Eduardo Sandrea LABOR, RN Outcome: Progressing   Problem: Coping: Goal: Psychosocial and spiritual needs will be supported 08/21/2024 1537 by Eduardo Sandrea LABOR, RN Outcome: Not Applicable 08/21/2024 1537 by Eduardo Sandrea LABOR, RN Outcome: Progressing   Problem: Respiratory: Goal: Will maintain a patent airway 08/21/2024 1537 by Eduardo Sandrea LABOR, RN Outcome: Not Applicable 08/21/2024 1537 by Eduardo Sandrea LABOR, RN Outcome: Progressing Goal: Complications related to the disease process, condition or treatment will be avoided or minimized 08/21/2024 1537 by Eduardo Sandrea LABOR, RN Outcome: Not Applicable 08/21/2024 1537 by Eduardo Sandrea LABOR, RN Outcome: Progressing

## 2024-08-21 NOTE — Progress Notes (Signed)
  Progress Note   Patient: Earl [name] FMW:[id number] DOB: [birth year] DOA: 03/04/2024     170 DOS: the patient was seen and examined on 08/21/2024        Brief hospital course: 83 y.o. 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       Assessment and Plan: Acute metabolic encephalopathy Hypotension BP acutely lower today.  INR up to 2.3, lactate normal.  No fever or leukocytosis.  Change occurred overnight, was stable last night when last seen by daughter and Palliative care.  This morning, more sluggish, less alert, BP in 80s-90s.   CXR personally reviewed, worsening effusions from 1 month ago, probably some degree of pneumonia - Start empiric antibiotics - Obtain blood and urine cultures - Bolus fluids - Hold diuretics   Coagulopathy Dietary vs DIC.  Normal lactate, no overt sepsis. - Given vitamin K - Fibrinogen, LDH  Cardiac amyloid CHF - Hold diuretics - Continue Tafamidis    Sacral decubitus ulcer Severe protein calorie malnutrition - Continue nutritional supplements, vitamins  Hypothyroidism - Continue levothyroxine   Atrial fibrillation - Continue Eliquis , amiodarone   Diabetes - Continue metformin        Subjective: Less alert today.  Unable to articulate complaints.  Nursing note decreased mentation, no focal complaints.     Physical Exam: BP 108/70 (BP Location: Left Arm)   Pulse 81   Temp 97.6 F (36.4 C) (Oral)   Resp 19   Ht 5' 11 (1.803 m)   Wt 72.1 kg   SpO2 93%   BMI 22.18 kg/m   Adult male, lying in bed, sluggish, does not open eyes, gives one word answers to duaghter RRR no murmurs.  Pitting in elbows, shins, unchanged from previous Respiratory rate slow shallow, very diminished, I cannot hear lung sounds Abdomen without grimace to palpation, does not open eyes to exam or flinch,  guard   Data Reviewed: BMP shows elevated BUN/Cr ratio, normal Cr, normal WBC, mild anemia INR elevated CXR personnally reviewed, shows persistent bilateral effusions, moderate in size, no obvious infiltrate  Family Communication: Daughter at bedside    Disposition: Status is: Inpatient         Author: Lonni SHAUNNA Dalton, MD 08/21/2024 6:53 PM  For on call review [email or web address].

## 2024-08-21 NOTE — Progress Notes (Signed)
 Pharmacy Antibiotic Note  Earl [name] is a 83 y.o. male admitted on 03/04/2024 with sepsis.  Pharmacy has been consulted for Vancomycin  and Cefepime  dosing for 7 days.  Plan: Vancomycin  1500 mg IV every 24 hours.  Goal trough 15-20 mcg/mL. Calculated AUC 466.6. Cefepime  2g Q8H.  Height: 5' 11 (180.3 cm) Weight: 72.1 kg (159 lb) IBW/kg (Calculated) : 75.3  Temp (24hrs), Avg:97.4 F (36.3 C), Min:97.3 F (36.3 C), Max:97.6 F (36.4 C)  Recent Labs  Lab 08/16/24 1100 08/17/24 0831 08/18/24 0818 08/20/24 0442 08/21/24 0207  WBC 5.9 7.8 6.3 6.5 6.1  CREATININE 0.83 0.81 0.86 0.87 0.86    Estimated Creatinine Clearance: 66.4 mL/min (by C-G formula based on SCr of 0.86 mg/dL).    Allergies  Allergen Reactions   Penicillins Other (See Comments)    Did it involve swelling of the face/tongue/throat, SOB, or low BP? Unknown Did it involve sudden or severe rash/hives, skin peeling, or any reaction on the inside of your mouth or nose? Unknown Did you need to seek medical attention at a hospital or doctor's office? No When did it last happen?    Over 76 Years Ago   If all above answers are NO, may proceed with cephalosporin use.      Antimicrobials this admission: Vancomycin   10/1 >>  Cefepime  10/1 >>  Metronidazole  10/1>>  Dose adjustments this admission:   Microbiology results:   Thank you for allowing pharmacy to be a part of this patient's care.  Larraine Brazier, PharmD Clinical Pharmacist 08/21/2024  2:57 PM **Pharmacist phone directory can now be found on [email or web address] ([email or web address]).  Listed under [facility].

## 2024-08-21 NOTE — Plan of Care (Signed)
  Problem: Fluid Volume: Goal: Hemodynamic stability will improve Outcome: Progressing   Problem: Clinical Measurements: Goal: Diagnostic test results will improve Outcome: Progressing Goal: Signs and symptoms of infection will decrease Outcome: Progressing   Problem: Education: Goal: Knowledge of risk factors and measures for prevention of condition will improve Outcome: Progressing   Problem: Coping: Goal: Psychosocial and spiritual needs will be supported Outcome: Progressing   Problem: Respiratory: Goal: Will maintain a patent airway Outcome: Progressing Goal: Complications related to the disease process, condition or treatment will be avoided or minimized Outcome: Progressing   Problem: Education: Goal: Ability to describe self-care measures that may prevent or decrease complications (Diabetes Survival Skills Education) will improve Outcome: Progressing   Problem: Coping: Goal: Ability to adjust to condition or change in health will improve Outcome: Progressing   Problem: Fluid Volume: Goal: Ability to maintain a balanced intake and output will improve Outcome: Progressing   Problem: Health Behavior/Discharge Planning: Goal: Ability to manage health-related needs will improve Outcome: Progressing   Problem: Metabolic: Goal: Ability to maintain appropriate glucose levels will improve Outcome: Progressing   Problem: Nutritional: Goal: Maintenance of adequate nutrition will improve Outcome: Progressing Goal: Progress toward achieving an optimal weight will improve Outcome: Progressing   Problem: Skin Integrity: Goal: Risk for impaired skin integrity will decrease Outcome: Progressing   Problem: Tissue Perfusion: Goal: Adequacy of tissue perfusion will improve Outcome: Progressing   Problem: Education: Goal: Knowledge of General Education information will improve Description: Including pain rating scale, medication(s)/side effects and non-pharmacologic  comfort measures Outcome: Progressing   Problem: Health Behavior/Discharge Planning: Goal: Ability to manage health-related needs will improve Outcome: Progressing   Problem: Clinical Measurements: Goal: Ability to maintain clinical measurements within normal limits will improve Outcome: Progressing Goal: Will remain free from infection Outcome: Progressing Goal: Diagnostic test results will improve Outcome: Progressing Goal: Respiratory complications will improve Outcome: Progressing Goal: Cardiovascular complication will be avoided Outcome: Progressing   Problem: Activity: Goal: Risk for activity intolerance will decrease Outcome: Progressing   Problem: Nutrition: Goal: Adequate nutrition will be maintained Outcome: Progressing   Problem: Coping: Goal: Level of anxiety will decrease Outcome: Progressing   Problem: Elimination: Goal: Will not experience complications related to bowel motility Outcome: Progressing Goal: Will not experience complications related to urinary retention Outcome: Progressing   Problem: Pain Managment: Goal: General experience of comfort will improve and/or be controlled Outcome: Progressing   Problem: Safety: Goal: Ability to remain free from injury will improve Outcome: Progressing   Problem: Skin Integrity: Goal: Risk for impaired skin integrity will decrease Outcome: Progressing

## 2024-08-22 DIAGNOSIS — A419 Sepsis, unspecified organism: Secondary | ICD-10-CM | POA: Diagnosis not present

## 2024-08-22 DIAGNOSIS — Z515 Encounter for palliative care: Secondary | ICD-10-CM | POA: Diagnosis not present

## 2024-08-22 DIAGNOSIS — I5022 Chronic systolic (congestive) heart failure: Secondary | ICD-10-CM | POA: Diagnosis not present

## 2024-08-22 DIAGNOSIS — E854 Organ-limited amyloidosis: Secondary | ICD-10-CM | POA: Diagnosis not present

## 2024-08-22 DIAGNOSIS — R6520 Severe sepsis without septic shock: Secondary | ICD-10-CM | POA: Diagnosis not present

## 2024-08-22 LAB — GLUCOSE, CAPILLARY: Glucose-Capillary: 155 mg/dL — ABNORMAL HIGH (ref 70–99)

## 2024-08-22 MED ORDER — SCOPOLAMINE 1 MG/3DAYS TD PT72
1.0000 | MEDICATED_PATCH | TRANSDERMAL | Status: DC
  Administered 2024-08-22: 1 mg via TRANSDERMAL
  Filled 2024-08-22: qty 1

## 2024-08-22 MED ORDER — FLUTICASONE PROPIONATE 50 MCG/ACT NA SUSP: 1.0000 | Freq: Every day | NASAL | Status: DC | PRN

## 2024-08-22 NOTE — Progress Notes (Signed)
 Daily Progress Note   Patient Name: Earl [name]       Date: 08/22/2024 DOB: Apr 16, 1941  Age: 83 y.o. MRN#: [id number] Attending Physician: Jonel Lonni SQUIBB, * Primary Care Physician: [name], MD Admit Date: 03/04/2024  Reason for Consultation/Follow-up: Establishing goals of care  Subjective: Medical records reviewed including progress notes, labs, imaging, MAR.  Patient assessed at the bedside.  He is minimally responsive, did open his eyes a couple of times during my visit.  His daughter and son are present.  Created space and opportunity for family's thoughts and feelings on patient's current illness.  He has had quite a few visitors, both personally and from hospital staff, seems to be resting very well.  We discussed management of his secretions and I recommended adding another medication (scopolamine) for additional relief.  Discussed oxygen via nasal cannula, which does not provide benefit or comfort at end-of-life, and reviewed option to remove whenever they are comfortable with doing so or it seems patient would like it off.  Reviewed other as needed medications for comfort including, which can be scheduled or provided via drip if patient is needs escalate.  Patient's family verbalized understanding.  Emotional support therapeutic listening was provided.  Questions and concerns addressed. PMT will continue to support holistically.  Length of Stay: 171   Physical Exam Vitals and nursing note reviewed.  Constitutional:      General: He is not in acute distress.    Appearance: He is ill-appearing.     Interventions: Nasal cannula in place.     Comments: Facial edema, audible secretions with respirations  HENT:     Head: Normocephalic and atraumatic.  Cardiovascular:     Rate and Rhythm: Normal rate.   Pulmonary:     Effort: Pulmonary effort is normal.  Skin:    General: Skin is warm and dry.  Neurological:     Mental Status: He is alert. Mental status is at baseline.            Vital Signs: BP 121/73 (BP Location: Left Arm)   Pulse 87   Temp 98 F (36.7 C)   Resp 19   Ht 5' 11 (1.803 m)   Wt 72.1 kg   SpO2 95%   BMI 22.18 kg/m  SpO2: SpO2: 95 % O2 Device: O2 Device: Nasal Cannula O2 Flow Rate: O2 Flow Rate (L/min): 2 L/min      Palliative Assessment/Data: 10%   Palliative Care Assessment & Plan   Patient Profile: Per intake H&P --> Patient is a 83 year old male with history of paroxysmal A-fib, insulin -dependent diabetes, hypertension, hyperlipidemia, CVA with residual right-sided weakness, MGUS, GERD, BPH, hypothyroidism, HFrEF with EF of 40 to 45%, chronic pancytopenia who presented to the emergency department from SNF with complaint of shortness of breath, wound, pain in the sacral area.    Palliative care evaluated Benjaman earlier this month and have been asked to get re-involved for additional goals of care conversations.   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 Marvine Mickle SHAUNNA Wonda, PA-C  Palliative Medicine Team Team phone # [phone]  Thank you for allowing the Palliative Medicine Team to assist in the care of this patient. Please utilize secure chat with additional  questions, if there is no response within 30 minutes please call the above phone number.  Palliative Medicine Team providers are available by phone from 7am to 7pm daily and can be reached through the team cell phone.  Should this patient require assistance outside of these hours, please call the patient's attending physician.    Time Total: 35  Visit consisted of counseling and education dealing with the complex and emotionally intense issues of symptom management and palliative care in the setting of serious and potentially life-threatening illness. Greater than 50% of this time was spent counseling and coordinating care related to the above assessment and plan.  Personally spent 35 minutes in patient care including extensive chart review (labs, imaging, progress/consult notes, vital signs), medically appropraite exam, discussed with treatment team, education to patient, family, and staff, documenting clinical information, medication review and management, coordination of care, and available advanced directive documents.

## 2024-08-22 NOTE — Progress Notes (Signed)
  Progress Note   Patient: Earl [name] FMW:[id number] DOB: October 29, 1941 DOA: 03/04/2024     171 DOS: the patient was seen and examined on 08/22/2024 at 7:34AM and 3:00PM      Brief hospital course: 83 y.o. 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      Assessment and Plan: Acute metabolic encephalopathy Acute on chronic systolic and diastolic CHF Cardiac amyloidosis Sacral decubitus ulcer, stage IV Severe protein calorie malnutrition Coagulopathy Polyneuropathy Hypothyroidism Paroxysmal atrial fibrillation Cerebrovascular disease Transaminitis Severe sepsis MGUS Hypertension Aspiration pneumonia Diabetes Acutely deteriorating 10/1, less responsive, hypotensive.  CXR obtained, showed worsening effusions and pulmonary edema, consistent with worsening heart failure.  Overnight, discussed with palliative care director Dr. Marvine; worsening heart failure was a known/feared complication of the current situation, despite efforts directed toward managing this in the past week by Dr. Royal.  In discussion with patient and family, decision made to focus on comfort, which I agree is the medically best as well as compassionate choice to make.   - Lidocaine  and scopolamine patch - Robinul as needed - Haldol, analgesics as needed - Antiemetic, bronchodilator as needed        Subjective: Poorly responsive today.  Appears comfortable per daughter and son.  No specific nursing concerns.     Physical Exam: BP 121/73 (BP Location: Left Arm)   Pulse 87   Temp 98 F (36.7 C)   Resp 19   Ht 5' 11 (1.803 m)   Wt 72.1 kg   SpO2 95%   BMI 22.18 kg/m   Adult male, lying in bed, sleeping, does not stir Heart rate normal, no murmurs Respiratory effort is shallow, very coarse secretions audible, no distress Abdomen without grimace to  palpation Does not open eyes or converse.      Data Reviewed: Discussed with Palliative Care Dr. Marvine     Family Communication: Son and daughter at bedside    Disposition: Status is: Inpatient         Author: Lonni SHAUNNA Dalton, MD 08/22/2024 3:10 PM  For on call review [email or web address].

## 2024-08-22 NOTE — Consult Note (Signed)
 WOC Nurse Consult Note:  Chart reviewed and noted overnight changes and now comfort care with anticipated hospital death.  WTA spoke with daughter at bedside, who requests to leave current dressings in place along with NPWT dressing in place so as not to disturb patient's comfort.  Will modify orders to reflect a PRN schedule, if the family should desire a dressing change.     Thank you,  Doyal Polite, MSN, RN, [facility] WOC Team [phone] (Available Mon-Fri 0700-1500)

## 2024-08-22 NOTE — Progress Notes (Signed)
 This chaplain responded to Dr. Bertha consult for spiritual care. The chaplain reviewed the chart notes and received an update from the Pt. RN-Renee before the visit.   The chaplain observed the Pt. resting without distress in the setting of comfort focused care. The Pt. son-Brent and daughter-Para are visiting at the bedside and accept the chaplain's invitation for spiritual care.  The chaplain listened reflectively as Murrell and Thresa share the meaning of legacy in the Pt. story. The chaplain understands the Pt. legacy is growing and is shared among many generations of family and students.  The family accepted the chaplain's invitation for prayer and F/U spiritual care.  Chaplain Leeroy Hummer [phone]

## 2024-08-22 NOTE — Progress Notes (Signed)
 Nutrition Brief Note  Chart reviewed. Pt transitioned to comfort care on 10/01.  No further nutrition interventions planned at this time.  Please re-consult as needed.   Braylon Lemmons, MS, RD, LDN Clinical Dietitian  Please see AMiON for contact information.

## 2024-08-22 NOTE — Plan of Care (Signed)
  Problem: Education: Goal: Knowledge of the prescribed therapeutic regimen will improve Outcome: Completed/Met   

## 2024-08-26 LAB — CULTURE, BLOOD (ROUTINE X 2)
Culture: NO GROWTH
Culture: NO GROWTH
Special Requests: ADEQUATE
Special Requests: ADEQUATE

## 2024-09-12 ENCOUNTER — Other Ambulatory Visit: Payer: Self-pay

## 2024-09-12 ENCOUNTER — Other Ambulatory Visit (HOSPITAL_COMMUNITY): Payer: Self-pay

## 2024-09-12 NOTE — Progress Notes (Signed)
 Patient is deceased per EPIC/WAM. Disenrolling patient from [facility].

## 2024-09-21 NOTE — Death Summary Note (Signed)
 DEATH SUMMARY   Patient Details  Name: Earl [name] MRN: [id number] DOB: [birth year] ERE:Czolijwip, Jackalyn, MD Admission/Discharge Information   Admit Date:  09-Mar-2024  Date of Death: Date of Death: 08/28/2024  Time of Death: Time of Death: 0415  Length of Stay: 172   Principle Cause of death: Acute on chronic systolic and diastolic congestive heart failure due to malnutrition due to sacral decubitus ulcer due to Transthyretin cardiac amyloidosis neuropathy  Hospital Diagnoses: Principal Problem:   Severe sepsis (HCC) Active Problems:   Paroxysmal atrial fibrillation (HCC)   Stage IV sacral decubitus ulcer   MGUS (monoclonal gammopathy of unknown significance)   Essential hypertension   Hyperlipidemia   Chronic systolic congestive heart failure   Cardiac amyloidosis   Transthyretic (ATTR) cardiac amyloidosis   Severe protein calorie malnutrition   Hypothyroidism   History of stroke   Diabetes   Aspiration pneumonia   Transaminitis   Coagulopathy   Acute metabolic encephalopathy    Hospital Course: 83 y.o. 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   After recent admission for COVID, the patient was admitted with signs and symptoms of sepsis, which turned out to be from a large sacral wound.  He was treated with IV antibiotics.  Infectious disease, the wound care service, general surgery, and plastic surgery were consulted for assistance with management of the wound.  Given his weakness (stemming from COVID as well as underlying amyloid neuropathy and malnutrition) and inability to offload the wound, there were unfortunately not surgical or medical treatments to promote wound healing.  Patient received inpatient hospital level wound care and nutritional support without improvement.    On Oct 1, he deteriorated mentally  quite quickly, work up showed likely congestive heart failure, and he passed with dignity and with daughter at bedside.            The results of significant diagnostics from this hospitalization (including imaging, microbiology, ancillary and laboratory) are listed below for reference.   Significant Diagnostic Studies: DG CHEST PORT 1 VIEW Result Date: 08/21/2024 EXAM: 1 VIEW(S) XRAY OF THE CHEST 08/21/2024 03:09:00 PM COMPARISON: 08/16/2024 CLINICAL HISTORY: Hypotension, Sepsis FINDINGS: LUNGS AND PLEURA: Bibasilar hazy opacities, similar. Moderate bilateral pleural effusions, similar. Mild pulmonary edema. No pneumothorax. HEART AND MEDIASTINUM: Cardiomegaly, similar to prior study. No acute abnormality of the mediastinal silhouette. BONES AND SOFT TISSUES: No acute osseous abnormality. IMPRESSION: 1. Mild pulmonary edema. 2. Moderate bilateral veiling pleural effusions, similar to prior study. 3. Cardiomegaly. Electronically signed by: Donnice Mania MD 08/21/2024 05:31 PM EDT RP Workstation: [id number]   DG Foot 2 Views Right Result Date: 08/20/2024 CLINICAL DATA:  Osteomyelitis. EXAM: RIGHT FOOT - 2 VIEW COMPARISON:  None Available. FINDINGS: There is no evidence of fracture or dislocation. No erosion, bony destruction or periostitis. No soft tissue gas or radiopaque foreign body. Dressing/artifact overlies the heel/hindfoot. IMPRESSION: No radiographic findings of osteomyelitis. Electronically Signed   By: Andrea Gasman M.D.   On: 08/20/2024 17:15   DG Abd 1 View Result Date: 08/18/2024 CLINICAL DATA:  Constipation. EXAM: ABDOMEN - 1 VIEW COMPARISON:  Abdominal radiograph 06/21/2024 FINDINGS: Small volume of formed stool in the ascending, transverse, and splenic flexure of the colon. No abnormal rectal distention. No small bowel dilatation or evidence of obstruction. No free air on the supine views. No visible radiopaque calculi. IMPRESSION: Small volume of formed stool in the  colon. No  bowel obstruction. Electronically Signed   By: Andrea Gasman M.D.   On: 08/18/2024 10:02   DG CHEST PORT 1 VIEW Result Date: 08/16/2024 EXAM: 1 VIEW(S) XRAY OF THE CHEST 08/16/2024 10:03:00 AM COMPARISON: 07/08/2024 CLINICAL HISTORY: aspiration in airway FINDINGS: LUNGS AND PLEURA: Mild pulmonary edema similar to prior. Hazy opacities in lower lung fields favor pleural effusions and atelectasis. No pneumothorax. HEART AND MEDIASTINUM: Stable cardiomegaly. BONES AND SOFT TISSUES: No acute osseous abnormality. IMPRESSION: 1. Mild pulmonary edema, similar to prior study. 2. Hazy lower lung opacities favoring small pleural effusions and atelectasis. 3. Stable cardiomegaly. Electronically signed by: Waddell Calk MD 08/16/2024 10:22 AM EDT RP Workstation: [id number]    Microbiology: Recent Results (from the past 240 hours)  Culture, blood (Routine X 2) w Reflex to ID Panel     Status: None (Preliminary result)   Collection Time: 08/21/24  4:32 PM   Specimen: BLOOD  Result Value Ref Range Status   Specimen Description BLOOD SITE NOT SPECIFIED  Final   Special Requests   Final    BOTTLES DRAWN AEROBIC AND ANAEROBIC Blood Culture adequate volume   Culture   Final    NO GROWTH 2 DAYS Performed at [facility], [address], [place], [place] [address]    Report Status PENDING  Incomplete  Culture, blood (Routine X 2) w Reflex to ID Panel     Status: None (Preliminary result)   Collection Time: 08/21/24  4:32 PM   Specimen: BLOOD  Result Value Ref Range Status   Specimen Description BLOOD SITE NOT SPECIFIED  Final   Special Requests   Final    BOTTLES DRAWN AEROBIC AND ANAEROBIC Blood Culture adequate volume   Culture   Final    NO GROWTH 2 DAYS Performed at [facility], [address] [place], [place] [address]    Report Status PENDING  Incomplete     Signed: Lonni SHAUNNA Dalton, MD 09-18-24

## 2024-09-21 NOTE — Progress Notes (Signed)
 Attempted to call patient placement x 2 before patient is transported.

## 2024-09-21 NOTE — Progress Notes (Signed)
 Pt passed away peacefully and comfortably with his son and daughter at his bedside.

## 2024-09-21 DEATH — deceased

## 2024-10-15 ENCOUNTER — Encounter: Payer: Medicare Other | Admitting: Internal Medicine

## 2024-11-25 ENCOUNTER — Other Ambulatory Visit (HOSPITAL_BASED_OUTPATIENT_CLINIC_OR_DEPARTMENT_OTHER): Payer: Self-pay
